# Patient Record
Sex: Male | Born: 1965
Health system: Southern US, Community
[De-identification: ages and names within clinical notes are randomized; demographics above are authoritative.]

## PROBLEM LIST (undated history)

## (undated) ENCOUNTER — Emergency Department (HOSPITAL_COMMUNITY): Admission: EM | Discharge status: Absent without leave | Payer: Medicare Other

## (undated) ENCOUNTER — Emergency Department (HOSPITAL_COMMUNITY): Admission: EM | Payer: Medicare Other | Source: Home / Self Care

## (undated) DIAGNOSIS — N186 End stage renal disease: Secondary | ICD-10-CM

## (undated) DIAGNOSIS — I82409 Acute embolism and thrombosis of unspecified deep veins of unspecified lower extremity: Secondary | ICD-10-CM

## (undated) DIAGNOSIS — K7460 Unspecified cirrhosis of liver: Secondary | ICD-10-CM

## (undated) DIAGNOSIS — E8770 Fluid overload, unspecified: Secondary | ICD-10-CM

## (undated) DIAGNOSIS — R3989 Other symptoms and signs involving the genitourinary system: Secondary | ICD-10-CM

## (undated) DIAGNOSIS — IMO0002 Reserved for concepts with insufficient information to code with codable children: Secondary | ICD-10-CM

## (undated) DIAGNOSIS — I5022 Chronic systolic (congestive) heart failure: Secondary | ICD-10-CM

## (undated) DIAGNOSIS — K861 Other chronic pancreatitis: Secondary | ICD-10-CM

## (undated) DIAGNOSIS — E162 Hypoglycemia, unspecified: Secondary | ICD-10-CM

## (undated) DIAGNOSIS — N2889 Other specified disorders of kidney and ureter: Secondary | ICD-10-CM

## (undated) DIAGNOSIS — Z86718 Personal history of other venous thrombosis and embolism: Secondary | ICD-10-CM

## (undated) DIAGNOSIS — I132 Hypertensive heart and chronic kidney disease with heart failure and with stage 5 chronic kidney disease, or end stage renal disease: Secondary | ICD-10-CM

## (undated) DIAGNOSIS — Z7189 Other specified counseling: Secondary | ICD-10-CM

## (undated) DIAGNOSIS — G4731 Primary central sleep apnea: Secondary | ICD-10-CM

## (undated) DIAGNOSIS — J441 Chronic obstructive pulmonary disease with (acute) exacerbation: Secondary | ICD-10-CM

## (undated) DIAGNOSIS — J939 Pneumothorax, unspecified: Secondary | ICD-10-CM

## (undated) DIAGNOSIS — N289 Disorder of kidney and ureter, unspecified: Secondary | ICD-10-CM

## (undated) DIAGNOSIS — I700 Atherosclerosis of aorta: Secondary | ICD-10-CM

## (undated) DIAGNOSIS — Z992 Dependence on renal dialysis: Secondary | ICD-10-CM

## (undated) DIAGNOSIS — Z9115 Patient's noncompliance with renal dialysis: Secondary | ICD-10-CM

## (undated) DIAGNOSIS — M545 Low back pain, unspecified: Secondary | ICD-10-CM

## (undated) DIAGNOSIS — Z515 Encounter for palliative care: Secondary | ICD-10-CM

## (undated) DIAGNOSIS — F4323 Adjustment disorder with mixed anxiety and depressed mood: Secondary | ICD-10-CM

## (undated) DIAGNOSIS — R1011 Right upper quadrant pain: Secondary | ICD-10-CM

## (undated) DIAGNOSIS — K859 Acute pancreatitis without necrosis or infection, unspecified: Secondary | ICD-10-CM

## (undated) DIAGNOSIS — K5901 Slow transit constipation: Secondary | ICD-10-CM

## (undated) DIAGNOSIS — E8809 Other disorders of plasma-protein metabolism, not elsewhere classified: Secondary | ICD-10-CM

## (undated) DIAGNOSIS — G8929 Other chronic pain: Secondary | ICD-10-CM

## (undated) DIAGNOSIS — E1129 Type 2 diabetes mellitus with other diabetic kidney complication: Secondary | ICD-10-CM

## (undated) DIAGNOSIS — I428 Other cardiomyopathies: Secondary | ICD-10-CM

## (undated) DIAGNOSIS — M25552 Pain in left hip: Secondary | ICD-10-CM

## (undated) DIAGNOSIS — J810 Acute pulmonary edema: Secondary | ICD-10-CM

## (undated) DIAGNOSIS — R1902 Left upper quadrant abdominal swelling, mass and lump: Secondary | ICD-10-CM

## (undated) DIAGNOSIS — E875 Hyperkalemia: Secondary | ICD-10-CM

## (undated) DIAGNOSIS — R0781 Pleurodynia: Secondary | ICD-10-CM

## (undated) DIAGNOSIS — J90 Pleural effusion, not elsewhere classified: Secondary | ICD-10-CM

## (undated) DIAGNOSIS — I82611 Acute embolism and thrombosis of superficial veins of right upper extremity: Secondary | ICD-10-CM

## (undated) DIAGNOSIS — D649 Anemia, unspecified: Secondary | ICD-10-CM

## (undated) DIAGNOSIS — N281 Cyst of kidney, acquired: Secondary | ICD-10-CM

## (undated) DIAGNOSIS — F418 Other specified anxiety disorders: Secondary | ICD-10-CM

## (undated) DIAGNOSIS — R0902 Hypoxemia: Secondary | ICD-10-CM

## (undated) DIAGNOSIS — Z8619 Personal history of other infectious and parasitic diseases: Secondary | ICD-10-CM

## (undated) DIAGNOSIS — R1013 Epigastric pain: Secondary | ICD-10-CM

## (undated) DIAGNOSIS — R1110 Vomiting, unspecified: Secondary | ICD-10-CM

## (undated) DIAGNOSIS — M25512 Pain in left shoulder: Secondary | ICD-10-CM

## (undated) DIAGNOSIS — N19 Unspecified kidney failure: Secondary | ICD-10-CM

## (undated) DIAGNOSIS — R001 Bradycardia, unspecified: Secondary | ICD-10-CM

## (undated) DIAGNOSIS — K56609 Unspecified intestinal obstruction, unspecified as to partial versus complete obstruction: Secondary | ICD-10-CM

## (undated) DIAGNOSIS — I10 Essential (primary) hypertension: Secondary | ICD-10-CM

## (undated) DIAGNOSIS — I498 Other specified cardiac arrhythmias: Secondary | ICD-10-CM

## (undated) DIAGNOSIS — R079 Chest pain, unspecified: Secondary | ICD-10-CM

## (undated) DIAGNOSIS — N185 Chronic kidney disease, stage 5: Secondary | ICD-10-CM

## (undated) DIAGNOSIS — I5020 Unspecified systolic (congestive) heart failure: Secondary | ICD-10-CM

## (undated) DIAGNOSIS — K863 Pseudocyst of pancreas: Secondary | ICD-10-CM

## (undated) DIAGNOSIS — T4145XA Adverse effect of unspecified anesthetic, initial encounter: Secondary | ICD-10-CM

## (undated) DIAGNOSIS — I5042 Chronic combined systolic (congestive) and diastolic (congestive) heart failure: Secondary | ICD-10-CM

## (undated) DIAGNOSIS — T8859XA Other complications of anesthesia, initial encounter: Secondary | ICD-10-CM

## (undated) DIAGNOSIS — I2699 Other pulmonary embolism without acute cor pulmonale: Secondary | ICD-10-CM

## (undated) DIAGNOSIS — Z9689 Presence of other specified functional implants: Secondary | ICD-10-CM

## (undated) DIAGNOSIS — R109 Unspecified abdominal pain: Secondary | ICD-10-CM

## (undated) DIAGNOSIS — E1165 Type 2 diabetes mellitus with hyperglycemia: Secondary | ICD-10-CM

## (undated) DIAGNOSIS — R0600 Dyspnea, unspecified: Secondary | ICD-10-CM

## (undated) HISTORY — PX: CAPD INSERTION: SHX5233

## (undated) HISTORY — PX: CAPD REMOVAL: SHX5234

---

## 2000-09-02 ENCOUNTER — Inpatient Hospital Stay (HOSPITAL_COMMUNITY): Admission: EM | Admit: 2000-09-02 | Discharge: 2000-09-05 | Payer: Self-pay | Admitting: Emergency Medicine

## 2000-09-02 ENCOUNTER — Encounter: Payer: Self-pay | Admitting: Emergency Medicine

## 2000-09-03 ENCOUNTER — Encounter: Payer: Self-pay | Admitting: Nephrology

## 2000-09-04 ENCOUNTER — Encounter: Payer: Self-pay | Admitting: Nephrology

## 2000-09-30 ENCOUNTER — Ambulatory Visit (HOSPITAL_COMMUNITY): Admission: RE | Admit: 2000-09-30 | Discharge: 2000-09-30 | Payer: Self-pay | Admitting: Vascular Surgery

## 2000-09-30 ENCOUNTER — Encounter: Payer: Self-pay | Admitting: Vascular Surgery

## 2000-10-16 ENCOUNTER — Ambulatory Visit (HOSPITAL_COMMUNITY): Admission: RE | Admit: 2000-10-16 | Discharge: 2000-10-16 | Payer: Self-pay | Admitting: General Surgery

## 2000-11-14 ENCOUNTER — Ambulatory Visit (HOSPITAL_COMMUNITY): Admission: RE | Admit: 2000-11-14 | Discharge: 2000-11-14 | Payer: Self-pay | Admitting: *Deleted

## 2002-10-09 ENCOUNTER — Emergency Department (HOSPITAL_COMMUNITY): Admission: EM | Admit: 2002-10-09 | Discharge: 2002-10-09 | Payer: Self-pay | Admitting: Emergency Medicine

## 2002-11-02 ENCOUNTER — Emergency Department (HOSPITAL_COMMUNITY): Admission: EM | Admit: 2002-11-02 | Discharge: 2002-11-03 | Payer: Self-pay | Admitting: Emergency Medicine

## 2003-04-29 ENCOUNTER — Encounter: Admission: RE | Admit: 2003-04-29 | Discharge: 2003-04-29 | Payer: Self-pay | Admitting: Nephrology

## 2003-04-29 ENCOUNTER — Encounter: Payer: Self-pay | Admitting: Nephrology

## 2004-01-29 ENCOUNTER — Emergency Department (HOSPITAL_COMMUNITY): Admission: EM | Admit: 2004-01-29 | Discharge: 2004-01-29 | Payer: Self-pay | Admitting: Emergency Medicine

## 2004-07-22 HISTORY — PX: OTHER SURGICAL HISTORY: SHX169

## 2005-10-31 ENCOUNTER — Emergency Department (HOSPITAL_COMMUNITY): Admission: EM | Admit: 2005-10-31 | Discharge: 2005-10-31 | Payer: Self-pay | Admitting: Emergency Medicine

## 2007-11-06 ENCOUNTER — Emergency Department (HOSPITAL_COMMUNITY): Admission: EM | Admit: 2007-11-06 | Discharge: 2007-11-06 | Payer: Self-pay | Admitting: Emergency Medicine

## 2008-03-21 IMAGING — CT CT PELVIS W/O CM
2 of 4 series · 17 of 46 positions shown, 19 images · IV contrast (ORAL OMNI 350 &)
Comparison: none

HISTORY: Abdominal pain, cramping, nausea, vomiting, history hypertension, renal
insufficiency status post renal transplantation

[Series 2: routine abdomen · axial · 0.73mm/px · z∈[-487,-62]mm · 14 of 95 slices shown, 16 images]
[im 5/95  soft-tissue]
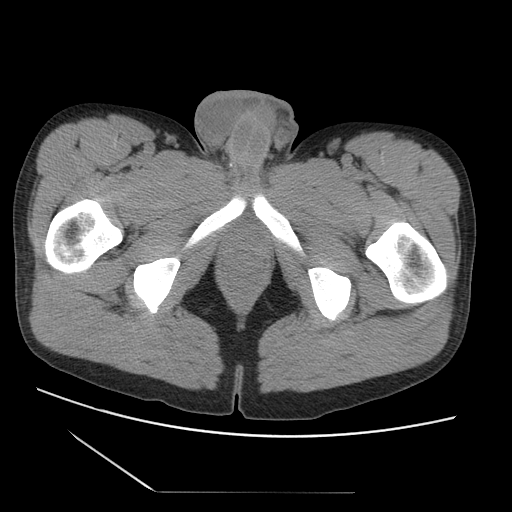
[im 5/95  bone]
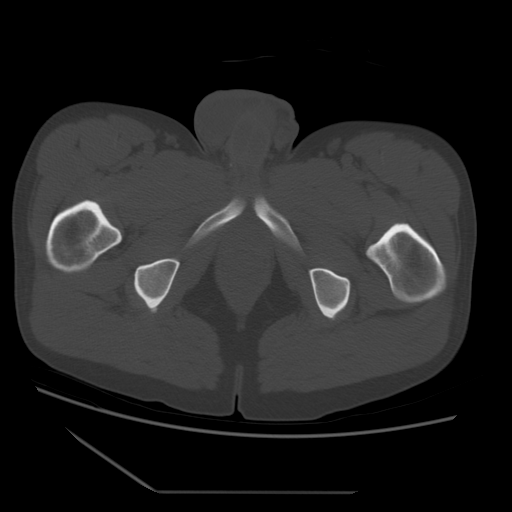
[im 13/95  soft-tissue]
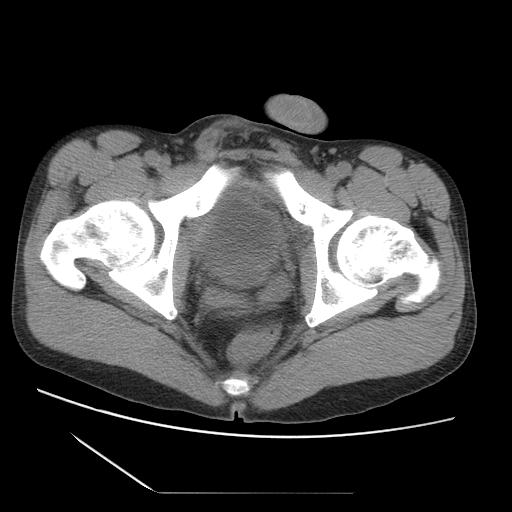
[im 17/95  soft-tissue]
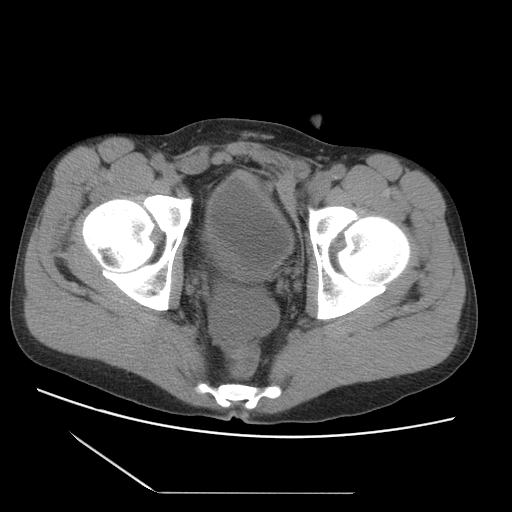
[im 25/95  soft-tissue]
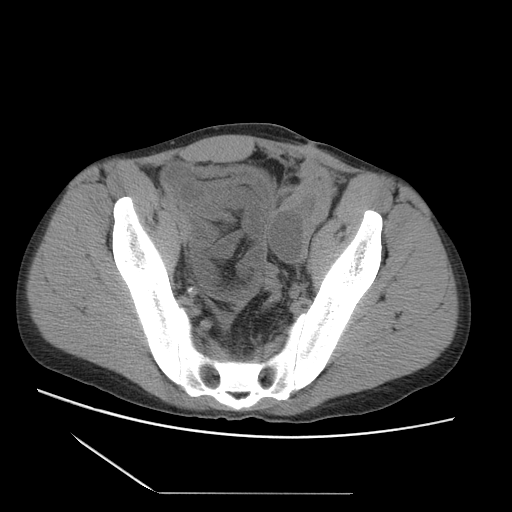
[im 33/95  soft-tissue]
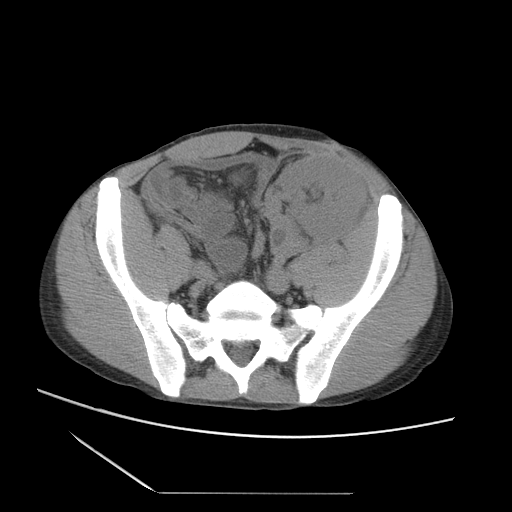
[im 37/95  soft-tissue]
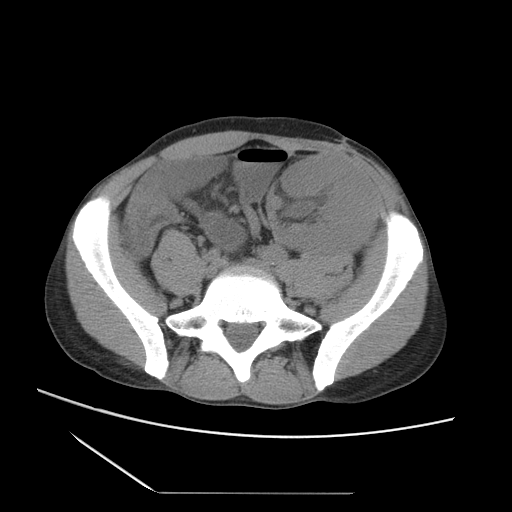
[im 45/95  soft-tissue]
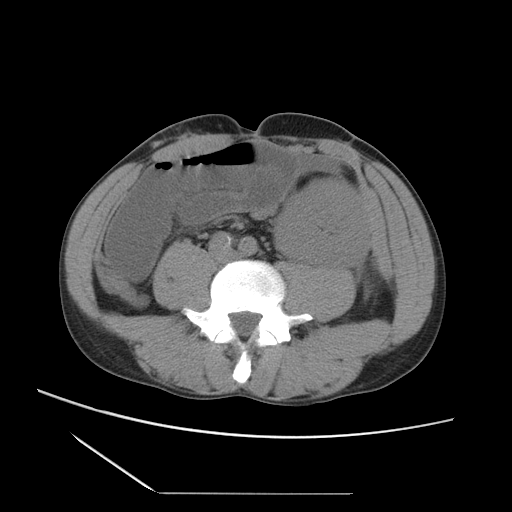
[im 50/95  soft-tissue]
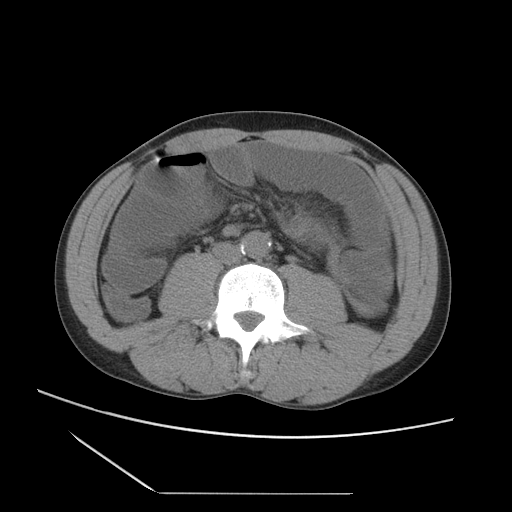
[im 58/95  soft-tissue]
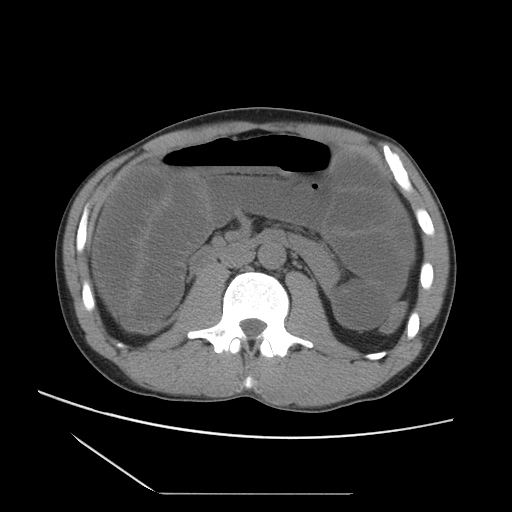
[im 58/95  bone]
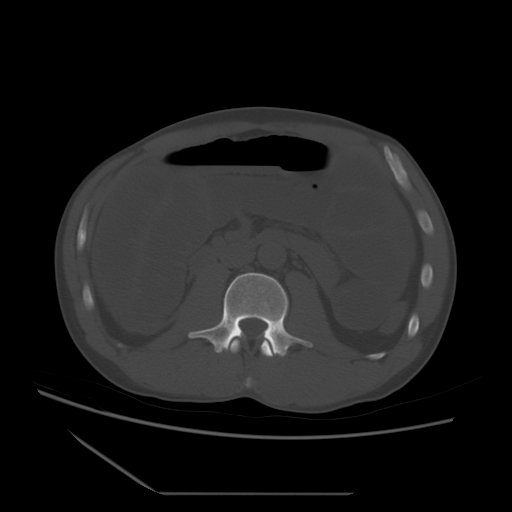
[im 62/95  soft-tissue]
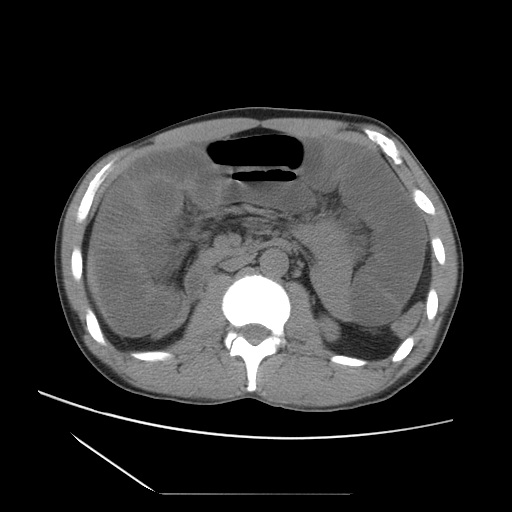
[im 70/95  soft-tissue]
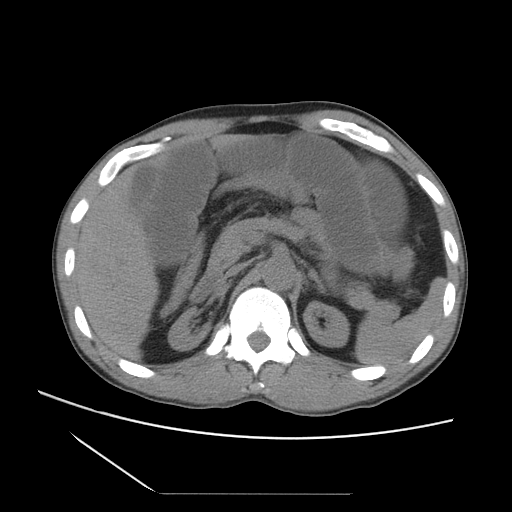
[im 78/95  soft-tissue]
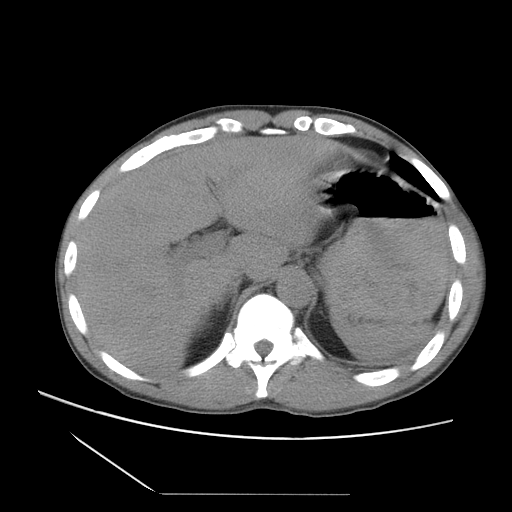
[im 82/95  soft-tissue]
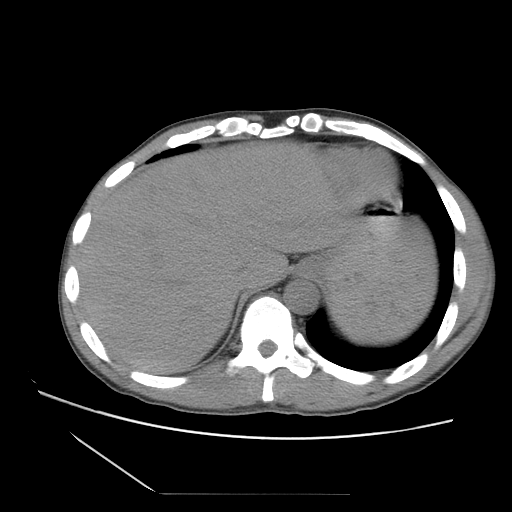
[im 90/95  soft-tissue]
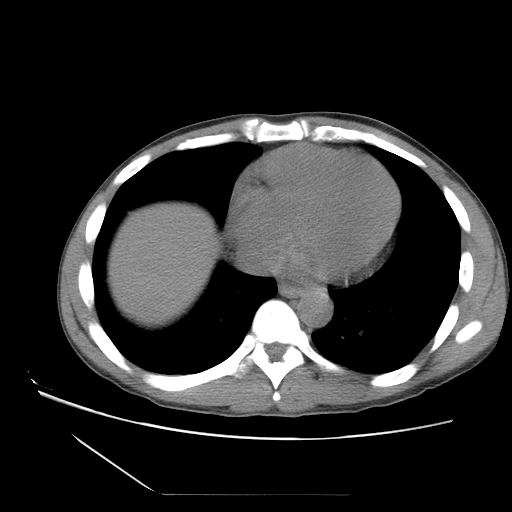

[Series 104: reformatted · coronal · 0.98mm/px · 3 of 118 slices shown]
[im 40/118  soft-tissue]
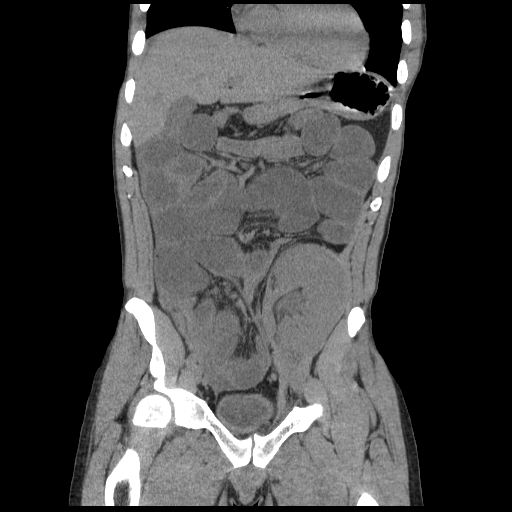
[im 53/118  soft-tissue]
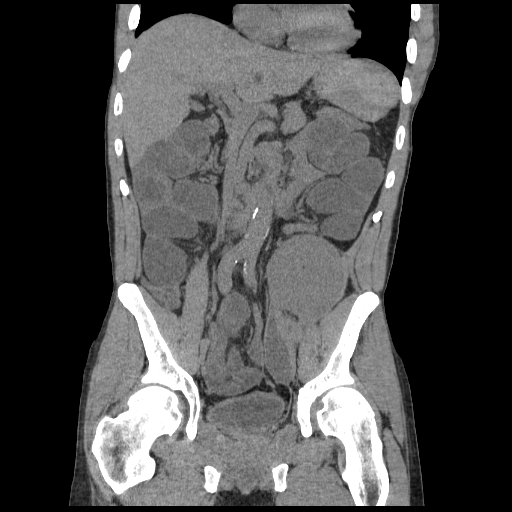
[im 66/118  soft-tissue]
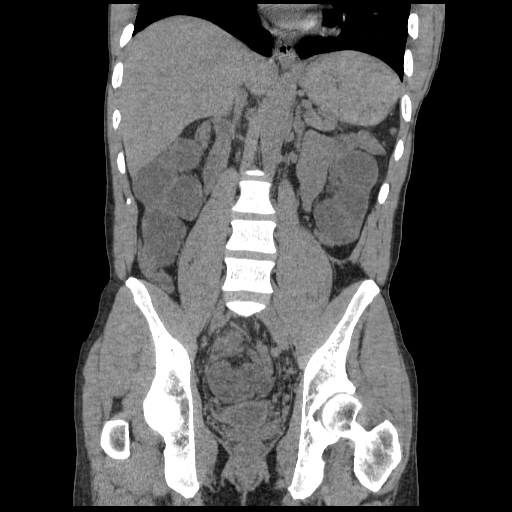

[17 of 46 positions shown; findings below may reference images not displayed]

CT ABDOMEN AND PELVIS WITHOUT CONTRAST:

Multidetector helical CT imaging abdomen pelvis performed.
IV contrast not utilized due to renal insufficiency and renal transplantation.
Patient attempted oral contrast but vomited the majority of this.

CT ABDOMEN:

Lung bases clear.
Atrophic native kidneys bilaterally.
No focal abnormalities of liver, pancreas, spleen, or adrenal glands.
Multiple dilated loops of small bowel in upper abdomen compatible with small
bowel obstruction.
Colon decompressed.
No mass or adenopathy.
Minimal ascites.
IMPRESSION: Small bowel obstruction, see below.

CT PELVIS:

Dilated proximal small bowel loops with normal caliber distal ileum with
transition point in mid pelvis.
No discrete soft tissue mass or hernia identified.
Transplanted kidney in left iliac fossa.
Inferior to left kidney, focal fluid collection identified in, 2.6 x 3.9 cm
image 71, appears separate from kidney, question postoperative seroma or
lymphocele.
Free fluid in pelvis.
No definite adenopathy, bowel wall thickening, or free air.
Minimal atherosclerotic calcification aorta.
Minimal bladder wall thickening.
No significant bone abnormality.
IMPRESSION: Small bowel obstruction at approximately the distal jejunum with transition
point from dilated to normal caliber in mid pelvis.
Transplanted kidney in left iliac fossa with minimal surrounding stranding,
nonspecific.
Focal fluid collection inferior to transplant kidney in mid lateral left pelvis,
[DATE] x 2.6 cm, question postoperative lymphocele or seroma.
Small to moderate ascites.

Findings called to Dr. Harsimran at time of interpretation.

## 2010-01-04 ENCOUNTER — Emergency Department (HOSPITAL_COMMUNITY): Admission: EM | Admit: 2010-01-04 | Discharge: 2010-01-04 | Payer: Self-pay | Admitting: Emergency Medicine

## 2011-10-16 ENCOUNTER — Emergency Department (HOSPITAL_COMMUNITY)
Admission: EM | Admit: 2011-10-16 | Discharge: 2011-10-16 | Disposition: A | Discharge status: Home / Self Care | Payer: Self-pay | Attending: Emergency Medicine | Admitting: Emergency Medicine

## 2011-10-16 ENCOUNTER — Encounter (HOSPITAL_COMMUNITY): Payer: Self-pay | Admitting: Emergency Medicine

## 2011-10-16 DIAGNOSIS — Z940 Kidney transplant status: Secondary | ICD-10-CM | POA: Insufficient documentation

## 2011-10-16 DIAGNOSIS — K029 Dental caries, unspecified: Secondary | ICD-10-CM | POA: Insufficient documentation

## 2011-10-16 DIAGNOSIS — I10 Essential (primary) hypertension: Secondary | ICD-10-CM | POA: Insufficient documentation

## 2011-10-16 DIAGNOSIS — Z79899 Other long term (current) drug therapy: Secondary | ICD-10-CM | POA: Insufficient documentation

## 2011-10-16 DIAGNOSIS — K0889 Other specified disorders of teeth and supporting structures: Secondary | ICD-10-CM

## 2011-10-16 LAB — CBC
Hemoglobin: 11.9 g/dL — ABNORMAL LOW (ref 13.0–17.0)
RBC: 4.29 MIL/uL (ref 4.22–5.81)
WBC: 7.5 10*3/uL (ref 4.0–10.5)

## 2011-10-16 LAB — POCT I-STAT, CHEM 8
BUN: 18 mg/dL (ref 6–23)
Calcium, Ion: 1.25 mmol/L (ref 1.12–1.32)
Chloride: 109 mEq/L (ref 96–112)

## 2011-10-16 MED ORDER — BUPIVACAINE HCL (PF) 0.5 % IJ SOLN
INTRAMUSCULAR | Status: AC
  Administered 2011-10-16: 04:00:00
  Filled 2011-10-16: qty 10

## 2011-10-16 MED ORDER — BUPIVACAINE HCL 0.5 % IJ SOLN: 50.0000 mL | Freq: Once | INTRAMUSCULAR | Status: DC

## 2011-10-16 MED ORDER — PENICILLIN V POTASSIUM 500 MG PO TABS: 500.0000 mg | ORAL_TABLET | Freq: Four times a day (QID) | ORAL | Status: AC

## 2011-10-16 MED ORDER — OXYCODONE-ACETAMINOPHEN 5-325 MG PO TABS: 2.0000 | ORAL_TABLET | ORAL | Status: AC | PRN

## 2011-10-16 MED ORDER — BUPIVACAINE HCL (PF) 0.5 % IJ SOLN
INTRAMUSCULAR | Status: AC
  Filled 2011-10-16: qty 10

## 2011-10-16 NOTE — ED Notes (Signed)
PT. REPORTS RIGHT UPPER MOLAR PAIN , RIGHT EAR ACHE AND HEADACHE FOR 2 DAYS.

## 2011-10-16 NOTE — ED Notes (Signed)
Rx x 2, pt voiced understanding to f/u with DDS

## 2011-10-16 NOTE — Discharge Instructions (Signed)
 Dental Pain A tooth ache may be caused by cavities (tooth decay). Cavities expose the nerve of the tooth to air and hot or cold temperatures. It may come from an infection or abscess (also called a boil or furuncle) around your tooth. It is also often caused by dental caries (tooth decay). This causes the pain you are having. DIAGNOSIS  Your caregiver can diagnose this problem by exam. TREATMENT   If caused by an infection, it may be treated with medications which kill germs (antibiotics) and pain medications as prescribed by your caregiver. Take medications as directed.   Only take over-the-counter or prescription medicines for pain, discomfort, or fever as directed by your caregiver.   Whether the tooth ache today is caused by infection or dental disease, you should see your dentist as soon as possible for further care.  SEEK MEDICAL CARE IF: The exam and treatment you received today has been provided on an emergency basis only. This is not a substitute for complete medical or dental care. If your problem worsens or new problems (symptoms) appear, and you are unable to meet with your dentist, call or return to this location. SEEK IMMEDIATE MEDICAL CARE IF:   You have a fever.   You develop redness and swelling of your face, jaw, or neck.   You are unable to open your mouth.   You have severe pain uncontrolled by pain medicine.  MAKE SURE YOU:   Understand these instructions.   Will watch your condition.   Will get help right away if you are not doing well or get worse.  Document Released: 07/08/2005 Document Revised: 06/27/2011 Document Reviewed: 02/24/2008 [facility] Patient Information 2012 [place], [place].

## 2011-10-16 NOTE — ED Provider Notes (Signed)
History     CSN: [id number]  Arrival date & time 10/16/11  0272   First MD Initiated Contact with Patient 10/16/11 0144      Chief Complaint  Patient presents with  . Dental Pain    (Consider location/radiation/quality/duration/timing/severity/associated sxs/prior treatment) Patient is a 46 y.o. male presenting with tooth pain. The history is provided by the patient.  Dental PainThe primary symptoms include mouth pain. Primary symptoms do not include headaches, fever or shortness of breath. The symptoms began 2 days ago. The symptoms are unchanged. The symptoms are new. The symptoms occur constantly.  Additional symptoms include: ear pain. Additional symptoms do not include: gum swelling, gum tenderness, trismus, facial swelling, trouble swallowing, pain with swallowing and excessive salivation. Medical issues do not include: smoking.   right upper first molar pain for last 2 days hurts to eat anything or put pressure on that tooth. No alleviating factors. Patient does not have a dentist. He has not been taking medications for this. Has had some chills. Pain is sharp in quality and radiates to right ear.  Past Medical History  Diagnosis Date  . Hypertension   . Renal failure     Past Surgical History  Procedure Date  . Kidney receipient     No family history on file.  History  Substance Use Topics  . Smoking status: Never Smoker   . Smokeless tobacco: Not on file  . Alcohol Use: No      Review of Systems  Constitutional: Negative for fever and chills.  HENT: Positive for ear pain and dental problem. Negative for facial swelling, trouble swallowing, neck pain and neck stiffness.   Eyes: Negative for pain.  Respiratory: Negative for shortness of breath.   Cardiovascular: Negative for chest pain.  Gastrointestinal: Negative for abdominal pain.  Genitourinary: Negative for dysuria.  Musculoskeletal: Negative for back pain.  Skin: Negative for rash.  Neurological:  Negative for headaches.  All other systems reviewed and are negative.    Allergies  Darvocet  Home Medications   Current Outpatient Rx  Name Route Sig Dispense Refill  . ATENOLOL 50 MG PO TABS Oral Take 50 mg by mouth daily.    [name] LABETALOL HCL 200 MG PO TABS Oral Take 200 mg by mouth 2 (two) times daily.    [name] MYCOPHENOLATE MOFETIL 500 MG PO TABS Oral Take 500 mg by mouth 2 (two) times daily.    [name] TACROLIMUS 1 MG PO CAPS Oral Take 3 mg by mouth 2 (two) times daily.    [name] TACROLIMUS 5 MG PO CAPS Oral Take 5 mg by mouth 2 (two) times daily.      BP 146/87  Pulse 58  Temp(Src) 98.6 F (37 C) (Oral)  Resp 14  SpO2 100%  Physical Exam  Constitutional: He is oriented to person, place, and time. He appears well-developed and well-nourished.  HENT:  Head: Normocephalic and atraumatic.       No trismus. Tender right upper first molar with caries present. No associated gingival swelling. No facial swelling or erythema.  Eyes: Conjunctivae and EOM are normal. Pupils are equal, round, and reactive to light.  Neck: Trachea normal. Neck supple. No thyromegaly present.  Cardiovascular: Normal rate, regular rhythm, S1 normal, S2 normal and normal pulses.     No systolic murmur is present   No diastolic murmur is present  Pulses:      Radial pulses are 2+ on the right side, and 2+ on the left side.  Pulmonary/Chest:  Effort normal and breath sounds normal. He has no wheezes. He has no rhonchi. He has no rales. He exhibits no tenderness.  Abdominal: Soft. Normal appearance and bowel sounds are normal. There is no tenderness. There is no CVA tenderness and negative Murphy's sign.  Musculoskeletal:       BLE:s Calves nontender, no cords or erythema, negative Homans sign  Neurological: He is alert and oriented to person, place, and time. He has normal strength. No cranial nerve deficit or sensory deficit. GCS eye subscore is 4. GCS verbal subscore is 5. GCS motor subscore is 6.  Skin: Skin is  warm and dry. No rash noted. He is not diaphoretic.  Psychiatric: His speech is normal.       Cooperative and appropriate    ED Course  Dental Date/Time: 10/16/2011 3:51 AM Performed by: Teressa Lower Authorized by: Teressa Lower Consent: Verbal consent obtained. Risks and benefits: risks, benefits and alternatives were discussed Consent given by: patient Patient understanding: patient states understanding of the procedure being performed Patient consent: the patient's understanding of the procedure matches consent given Procedure consent: procedure consent matches procedure scheduled Required items: required blood products, implants, devices, and special equipment available Patient identity confirmed: verbally with patient Time out: Immediately prior to procedure a "time out" was called to verify the correct patient, procedure, equipment, support staff and site/side marked as required. Preparation: Patient was prepped and draped in the usual sterile fashion. Local anesthesia used: yes Local anesthetic: bupivacaine 0.5% without epinephrine Anesthetic total: 2 ml Patient tolerance: Patient tolerated the procedure well with no immediate complications. Comments: Local right upper first molar injection. Post procedure  - Pain improving and patient tolerated procedure well. No blood loss.   (including critical care time)  Labs Reviewed  CBC - Abnormal; Notable for the following:    Hemoglobin 11.9 (*)    HCT 36.7 (*)    All other components within normal limits  POCT I-STAT, CHEM 8 - Abnormal; Notable for the following:    Creatinine, Ser 1.70 (*)    All other components within normal limits      MDM   Dental pain and presentation consistent with dental abscess. Injection as above improve symptoms. for history of kidney transplant will avoid Motrin. Plan discharge home with followup dentist. Prescription for Percocet and antibiotics provided.        Teressa Lower, MD 10/16/11  [phone]

## 2012-08-31 ENCOUNTER — Emergency Department (HOSPITAL_COMMUNITY): Payer: Medicare Other

## 2012-08-31 ENCOUNTER — Inpatient Hospital Stay (HOSPITAL_COMMUNITY)
Admission: EM | Admit: 2012-08-31 | Discharge: 2012-09-03 | DRG: 698 | Payer: Medicare Other | Attending: Internal Medicine | Admitting: Internal Medicine

## 2012-08-31 ENCOUNTER — Encounter (HOSPITAL_COMMUNITY): Payer: Self-pay

## 2012-08-31 DIAGNOSIS — N179 Acute kidney failure, unspecified: Secondary | ICD-10-CM | POA: Diagnosis present

## 2012-08-31 DIAGNOSIS — N19 Unspecified kidney failure: Secondary | ICD-10-CM

## 2012-08-31 DIAGNOSIS — N189 Chronic kidney disease, unspecified: Secondary | ICD-10-CM

## 2012-08-31 DIAGNOSIS — I120 Hypertensive chronic kidney disease with stage 5 chronic kidney disease or end stage renal disease: Secondary | ICD-10-CM | POA: Diagnosis present

## 2012-08-31 DIAGNOSIS — E872 Acidosis, unspecified: Secondary | ICD-10-CM | POA: Diagnosis present

## 2012-08-31 DIAGNOSIS — E876 Hypokalemia: Secondary | ICD-10-CM | POA: Diagnosis present

## 2012-08-31 DIAGNOSIS — D631 Anemia in chronic kidney disease: Secondary | ICD-10-CM | POA: Diagnosis present

## 2012-08-31 DIAGNOSIS — F329 Major depressive disorder, single episode, unspecified: Secondary | ICD-10-CM

## 2012-08-31 DIAGNOSIS — E871 Hypo-osmolality and hyponatremia: Secondary | ICD-10-CM | POA: Diagnosis present

## 2012-08-31 DIAGNOSIS — N039 Chronic nephritic syndrome with unspecified morphologic changes: Secondary | ICD-10-CM | POA: Diagnosis present

## 2012-08-31 DIAGNOSIS — T8610 Unspecified complication of kidney transplant: Principal | ICD-10-CM | POA: Diagnosis present

## 2012-08-31 DIAGNOSIS — Z79899 Other long term (current) drug therapy: Secondary | ICD-10-CM

## 2012-08-31 DIAGNOSIS — Y830 Surgical operation with transplant of whole organ as the cause of abnormal reaction of the patient, or of later complication, without mention of misadventure at the time of the procedure: Secondary | ICD-10-CM | POA: Diagnosis present

## 2012-08-31 DIAGNOSIS — E875 Hyperkalemia: Secondary | ICD-10-CM | POA: Diagnosis present

## 2012-08-31 DIAGNOSIS — R55 Syncope and collapse: Secondary | ICD-10-CM | POA: Diagnosis present

## 2012-08-31 DIAGNOSIS — N186 End stage renal disease: Secondary | ICD-10-CM | POA: Diagnosis present

## 2012-08-31 DIAGNOSIS — F3289 Other specified depressive episodes: Secondary | ICD-10-CM | POA: Diagnosis present

## 2012-08-31 DIAGNOSIS — I10 Essential (primary) hypertension: Secondary | ICD-10-CM

## 2012-08-31 HISTORY — DX: Adverse effect of unspecified anesthetic, initial encounter: T41.45XA

## 2012-08-31 HISTORY — DX: Other complications of anesthesia, initial encounter: T88.59XA

## 2012-08-31 LAB — CBC WITH DIFFERENTIAL/PLATELET
Basophils Relative: 0 % (ref 0–1)
Eosinophils Absolute: 0.2 10*3/uL (ref 0.0–0.7)
Eosinophils Relative: 3 % (ref 0–5)
Hemoglobin: 10.7 g/dL — ABNORMAL LOW (ref 13.0–17.0)
Lymphocytes Relative: 14 % (ref 12–46)
MCH: 27 pg (ref 26.0–34.0)
MCHC: 33.3 g/dL (ref 30.0–36.0)
Monocytes Relative: 10 % (ref 3–12)
Neutro Abs: 5.6 10*3/uL (ref 1.7–7.7)
Platelets: 419 10*3/uL — ABNORMAL HIGH (ref 150–400)
RDW: 13.2 % (ref 11.5–15.5)

## 2012-08-31 LAB — URINALYSIS, ROUTINE W REFLEX MICROSCOPIC
Glucose, UA: NEGATIVE mg/dL
Protein, ur: 100 mg/dL — AB
Specific Gravity, Urine: 1.011 (ref 1.005–1.030)
pH: 6.5 (ref 5.0–8.0)

## 2012-08-31 LAB — GLUCOSE, CAPILLARY: Glucose-Capillary: 135 mg/dL — ABNORMAL HIGH (ref 70–99)

## 2012-08-31 LAB — URINE MICROSCOPIC-ADD ON

## 2012-08-31 LAB — BASIC METABOLIC PANEL
Chloride: 92 mEq/L — ABNORMAL LOW (ref 96–112)
Creatinine, Ser: 20.77 mg/dL — ABNORMAL HIGH (ref 0.50–1.35)
Sodium: 126 mEq/L — ABNORMAL LOW (ref 135–145)

## 2012-08-31 LAB — POCT I-STAT TROPONIN I: Troponin i, poc: 0 ng/mL (ref 0.00–0.08)

## 2012-08-31 MED ORDER — TACROLIMUS 1 MG PO CAPS
8.0000 mg | ORAL_CAPSULE | Freq: Two times a day (BID) | ORAL | Status: DC
  Administered 2012-09-01 – 2012-09-03 (×6): 8 mg via ORAL
  Filled 2012-08-31 (×7): qty 8

## 2012-08-31 MED ORDER — ACETAMINOPHEN 650 MG RE SUPP: 650.0000 mg | Freq: Four times a day (QID) | RECTAL | Status: DC | PRN

## 2012-08-31 MED ORDER — INSULIN ASPART 100 UNIT/ML ~~LOC~~ SOLN
10.0000 [IU] | Freq: Once | SUBCUTANEOUS | Status: AC
  Administered 2012-08-31: 10 [IU] via SUBCUTANEOUS
  Filled 2012-08-31: qty 10

## 2012-08-31 MED ORDER — DEXTROSE 50 % IV SOLN
1.0000 | Freq: Once | INTRAVENOUS | Status: AC
  Administered 2012-08-31: 50 mL via INTRAVENOUS
  Filled 2012-08-31: qty 50

## 2012-08-31 MED ORDER — LABETALOL HCL 200 MG PO TABS
200.0000 mg | ORAL_TABLET | Freq: Two times a day (BID) | ORAL | Status: DC
  Administered 2012-09-01 – 2012-09-03 (×6): 200 mg via ORAL
  Filled 2012-08-31 (×8): qty 1

## 2012-08-31 MED ORDER — ACETAMINOPHEN 325 MG PO TABS
650.0000 mg | ORAL_TABLET | Freq: Four times a day (QID) | ORAL | Status: DC | PRN
  Administered 2012-09-02 – 2012-09-03 (×2): 650 mg via ORAL
  Filled 2012-08-31 (×2): qty 2

## 2012-08-31 MED ORDER — SODIUM CHLORIDE 0.9 % IV SOLN
1.0000 g | Freq: Once | INTRAVENOUS | Status: AC
  Administered 2012-08-31: 1 g via INTRAVENOUS
  Filled 2012-08-31: qty 10

## 2012-08-31 MED ORDER — ENOXAPARIN SODIUM 30 MG/0.3ML ~~LOC~~ SOLN
30.0000 mg | SUBCUTANEOUS | Status: DC
  Administered 2012-09-01: 30 mg via SUBCUTANEOUS
  Filled 2012-08-31 (×4): qty 0.3

## 2012-08-31 MED ORDER — INSULIN REGULAR HUMAN 100 UNIT/ML IJ SOLN
10.0000 [IU] | Freq: Once | INTRAMUSCULAR | Status: DC
  Filled 2012-08-31: qty 0.1

## 2012-08-31 MED ORDER — SODIUM POLYSTYRENE SULFONATE 15 GM/60ML PO SUSP
30.0000 g | Freq: Once | ORAL | Status: AC
  Administered 2012-08-31: 30 g via ORAL
  Filled 2012-08-31: qty 120

## 2012-08-31 MED ORDER — ONDANSETRON HCL 4 MG PO TABS: 4.0000 mg | ORAL_TABLET | Freq: Four times a day (QID) | ORAL | Status: DC | PRN

## 2012-08-31 MED ORDER — ONDANSETRON HCL 4 MG/2ML IJ SOLN: 4.0000 mg | Freq: Four times a day (QID) | INTRAMUSCULAR | Status: DC | PRN

## 2012-08-31 MED ORDER — STERILE WATER FOR INJECTION IV SOLN: INTRAVENOUS | Status: DC

## 2012-08-31 MED ORDER — MYCOPHENOLATE MOFETIL 250 MG PO CAPS
500.0000 mg | ORAL_CAPSULE | Freq: Two times a day (BID) | ORAL | Status: DC
  Administered 2012-09-01 – 2012-09-03 (×6): 500 mg via ORAL
  Filled 2012-08-31 (×7): qty 2

## 2012-08-31 MED ORDER — STERILE WATER FOR INJECTION IV SOLN
INTRAVENOUS | Status: AC
  Administered 2012-08-31: 19:00:00 via INTRAVENOUS
  Filled 2012-08-31 (×2): qty 850

## 2012-08-31 MED ORDER — TACROLIMUS 1 MG PO CAPS
5.0000 mg | ORAL_CAPSULE | Freq: Two times a day (BID) | ORAL | Status: DC
  Filled 2012-08-31: qty 5

## 2012-08-31 MED ORDER — TACROLIMUS 1 MG PO CAPS
3.0000 mg | ORAL_CAPSULE | Freq: Two times a day (BID) | ORAL | Status: DC
  Filled 2012-08-31: qty 3

## 2012-08-31 MED ORDER — SODIUM BICARBONATE 8.4 % IV SOLN
50.0000 meq | Freq: Once | INTRAVENOUS | Status: AC
  Administered 2012-08-31: 50 meq via INTRAVENOUS
  Filled 2012-08-31: qty 50

## 2012-08-31 NOTE — Progress Notes (Signed)
CM also provided pt with needymeds.org to complete patient assistance programs for cellcept and prograf Cm provided the application for cellcept but prograf requires pt to go onto drug companies site Reviewed these with the pt and placed these written items in a patient belonging bag for the pt He voiced appreciation of services and resources offered

## 2012-08-31 NOTE — H&P (Signed)
[facility] History and Physical  [name] EXB:[id number] DOB: [birth year] DOA: 08/31/2012  Referring physician: PCP: Adin Hector, Student  Specialists:  Chief Complaint: Syncope  HPI: Frank [name] is a 47 y.o. male with history of renal transplant 8 years ago at [facility], hypertension who presents with above complaints. He reports that yesterday he had a syncopal episode, admits feeling dizzy prior to the episode. He states he told his sister about it today and she asked him to come to the ED. He denies nausea,vomiting, diarrhea, fevers, dysuria cough ,shortness of breath, leg and no chest pain. He was seen in the ED and a CT scan of his head was negative, and chest x-ray showed no active disease. labs revealed a sodium of 126 with a potassium of 6.5, BUN of 140 creatinine of 20.77 and CO2 of 13. EDP are consulted [facility]-spoke with Dr. Florene Glen who requested admission for [facility] and transfer [facility]. In the ED he was given a bicarb,insulin, amp of D50 and Kayexalate. He is admitted for further evaluation and management. The patient states that he went to jail in last November for 45 days and has not taken any of his transplant medications since then. He states his been urinating without difficulty, and has good po intake.  Review of Systems: The patient denies anorexia, fever, weight loss,, vision loss, decreased hearing, hoarseness, chest pain, , dyspnea on exertion, peripheral edema, balance deficits, hemoptysis, abdominal pain, melena, hematochezia, severe indigestion/heartburn, hematuria, incontinence, genital sores, muscle weakness, transient blindness, difficulty walking, depression, unusual weight change, abnormal bleeding..    Past Medical History  Diagnosis Date  . Hypertension   . Renal failure   . Depression   . Complication of anesthesia     itching, sore throat   Past Surgical History  Procedure Laterality Date  . Kidney receipient     . Nephrectomy transplanted organ     Social History:  reports that he has never smoked. He has never used smokeless tobacco. He reports that he does not drink alcohol or use illicit drugs. where does patient live--home  Can patient participate in ADLs-yes  Allergies  Allergen Reactions  . Darvocet (Propoxyphene-Acetaminophen) Hives    Family history-positive for diabetes mellitus  Prior to Admission medications   Medication Sig Start Date End Date Taking? Authorizing Provider  atenolol (TENORMIN) 50 MG tablet Take 50 mg by mouth daily.    Historical Provider, MD  labetalol (NORMODYNE) 200 MG tablet Take 200 mg by mouth 2 (two) times daily.    Historical Provider, MD  mycophenolate (CELLCEPT) 500 MG tablet Take 500 mg by mouth 2 (two) times daily.    Historical Provider, MD  tacrolimus (PROGRAF) 1 MG capsule Take 3 mg by mouth 2 (two) times daily.    Historical Provider, MD  tacrolimus (PROGRAF) 5 MG capsule Take 5 mg by mouth 2 (two) times daily.    Historical Provider, MD   Physical Exam: Filed Vitals:   08/31/12 1320 08/31/12 1549  BP: 147/94 157/97  Pulse: 62 69  Temp: 97.4 F (36.3 C)   TempSrc: Oral   Resp: 18 16  SpO2: 99%     Constitutional: Vital signs reviewed.  Patient is a well-developed and well-nourished in no acute distress and cooperative with exam. Alert and oriented x3.  Head: Normocephalic and atraumatic Mouth: no erythema or exudates, dry MM Eyes: PERRL, EOMI, conjunctivae normal, No scleral icterus.  Neck: Supple, Trachea midline normal ROM, No JVD, mass, thyromegaly,  or carotid bruit present.  Cardiovascular: RRR, S1 normal, S2 normal, no MRG, pulses symmetric and intact bilaterally Pulmonary/Chest: CTAB, no wheezes, rales, or rhonchi Abdominal: Soft. Non-tender, non-distended, bowel sounds are normal, no masses, organomegaly, or guarding present.  GU: no CVA tenderness  extremities: No cyanosis and no edema  Neurological: A&O x3, Strength is normal  and symmetric bilaterally, cranial nerve II-XII are grossly intact, no focal motor deficit, sensory intact to light touch bilaterally.  Skin: Warm, dry and intact. No rash.  Psychiatric: Normal mood and affect. speech and behavior is normal.  Labs on Admission:  Basic Metabolic Panel:  Recent Labs Lab 08/31/12 1335  NA 126*  K 6.5*  CL 92*  CO2 13*  GLUCOSE 93  BUN 140*  CREATININE 20.77*  CALCIUM 9.5   Liver Function Tests: No results found for this basename: AST, ALT, ALKPHOS, BILITOT, PROT, ALBUMIN,  in the last 168 hours No results found for this basename: LIPASE, AMYLASE,  in the last 168 hours No results found for this basename: AMMONIA,  in the last 168 hours CBC:  Recent Labs Lab 08/31/12 1335  WBC 7.7  NEUTROABS 5.6  HGB 10.7*  HCT 32.1*  MCV 81.1  PLT 419*   Cardiac Enzymes: No results found for this basename: CKTOTAL, CKMB, CKMBINDEX, TROPONINI,  in the last 168 hours  BNP (last 3 results) No results found for this basename: PROBNP,  in the last 8760 hours CBG: No results found for this basename: GLUCAP,  in the last 168 hours  Radiological Exams on Admission: Dg Chest 2 View  08/31/2012  *RADIOLOGY REPORT*  Clinical Data: Loss of consciousness.  History of hypertension.  CHEST - 2 VIEW  Comparison: None.  Findings: Artifact overlies chest.  Heart size is normal. Mediastinal shadows are normal.  The lungs are clear.  Vascularity is normal.  No significant bony finding.  IMPRESSION: No active disease.   Original Report Authenticated By: [name], M.D.    Ct Head Wo Contrast  08/31/2012  *RADIOLOGY REPORT*  Clinical Data: Syncope.  Fell with head trauma.  CT HEAD WITHOUT CONTRAST  Technique:  Contiguous axial images were obtained from the base of the skull through the vertex without contrast.  Comparison: None.  Findings: The brain has a normal appearance without evidence of malformation, atrophy, old or acute infarction, mass lesion, hemorrhage,  hydrocephalus or extra-axial collection.  The calvarium is unremarkable.  Sinuses, middle ears and mastoids are clear.  IMPRESSION: Normal head CT   Original Report Authenticated By: [name], M.D.     EKG: I  Assessment/Plan Active Problems:   Acute-on-chronic renal failure-in patient status post renal transplant 8 years ago. -He has been off his tranplant meds for at least 3 months -volume depletion also possible contributing factor -hydrate, transferred to [facility] for possible dialysis>> renal consulted per EDP-Dr. Florene Glen to see  -Outpatient medications Hyperkalemia -As above given bicarbonate, insulin/D50, and Kayexalate -Follow recheck and further treat accordingly -Renal consulted as above and Dr. Florene Glen to see   Metabolic acidosis -Secondary to #1, will place on bicarbonate drip and follow. Syncope -Obtain orthostatic vital signs, echo and follow Hypertension -Resume labetalol, monitor and further treat accordingly. Hyponatremia -Likely secondary to renal failure, treat as above, follow recheck and further manage accordingly  Code Status: full Family Communication:  Disposition Plan: Admit to [facility], tele.  Time spent: >16mns  VSheila Oats[facility]ad Hospitalists Pager [phone] If 7PM-7AM, please contact night-coverage [email or web address] Password [email or web address]2/04/2013, 5:52 PM

## 2012-08-31 NOTE — ED Notes (Signed)
Pt. Made aware for the need of urine. 

## 2012-08-31 NOTE — Consult Note (Signed)
Reason for Consult: Acute renal failure on chronic kidney disease stage III T. Referring Physician:  [name] M.D.-Emergency room physician  HPI:  47 year old African American man with history of end-stage renal disease secondary to underlying hypertension status post deceased donor kidney transplant with history of chronic allograft nephropathy and chronic kidney disease stage IIIT at baseline. Presented to the emergency room today after a syncopal event and loss of consciousness for about 2 hours. He states that upon awakening, he was concerned about his renal function and presented to the emergency room. Preceding this, for a period of about 6-7 weeks, he has not been on any antirejection medications for his kidney transplant (Prograf/CellCept) as he was incarcerated over this duration. He does state that upon his release, he desperately started to take some leftover Prograf and CellCept that he had from previous prescriptions.  He reports of some preceding nausea without any associated vomiting or dysgeusia. He states that he has had such an event before when he has missed his antirejection medications for about 2-3 weeks without suffering any physical problems. He emphatically states that he has been palpating his allograft and is not experiencing any tenderness. He denies any dysuria but reports some urgency particularly upon sitting up from a supine position. Denies hematuria.  Previous notes available from outpatient nephrology (Dr. Mercy Moore, [facility]) are significant for several lapses and problems with compliance.   Past Medical History  Diagnosis Date  . Hypertension   . Renal failure   . Depression   . Complication of anesthesia     itching, sore throat    Past Surgical History  Procedure Laterality Date  . Kidney receipient    . Nephrectomy transplanted organ      History reviewed. No pertinent family history.  Social History:  reports that he has never  smoked. He has never used smokeless tobacco. He reports that he does not drink alcohol or use illicit drugs.  Allergies:  Allergies  Allergen Reactions  . Darvocet (Propoxyphene-Acetaminophen) Hives    Medications: Prior to Admission:  (Not in a hospital admission)  Results for orders placed during the hospital encounter of 08/31/12 (from the past 48 hour(s))  CBC WITH DIFFERENTIAL     Status: Abnormal   Collection Time    08/31/12  1:35 PM      Result Value Range   WBC 7.7  4.0 - 10.5 K/uL   RBC 3.96 (*) 4.22 - 5.81 MIL/uL   Hemoglobin 10.7 (*) 13.0 - 17.0 g/dL   HCT 32.1 (*) 39.0 - 52.0 %   MCV 81.1  78.0 - 100.0 fL   MCH 27.0  26.0 - 34.0 pg   MCHC 33.3  30.0 - 36.0 g/dL   RDW 13.2  11.5 - 15.5 %   Platelets 419 (*) 150 - 400 K/uL   Neutrophils Relative 72  43 - 77 %   Neutro Abs 5.6  1.7 - 7.7 K/uL   Lymphocytes Relative 14  12 - 46 %   Lymphs Abs 1.1  0.7 - 4.0 K/uL   Monocytes Relative 10  3 - 12 %   Monocytes Absolute 0.8  0.1 - 1.0 K/uL   Eosinophils Relative 3  0 - 5 %   Eosinophils Absolute 0.2  0.0 - 0.7 K/uL   Basophils Relative 0  0 - 1 %   Basophils Absolute 0.0  0.0 - 0.1 K/uL   WBC Morphology MILD LEFT SHIFT (1-5% METAS, OCC MYELO, OCC BANDS)  Smear Review PLATELET COUNT CONFIRMED BY SMEAR    BASIC METABOLIC PANEL     Status: Abnormal   Collection Time    08/31/12  1:35 PM      Result Value Range   Sodium 126 (*) 135 - 145 mEq/L   Potassium 6.5 (*) 3.5 - 5.1 mEq/L   Comment: RESULT REPEATED AND VERIFIED     CRITICAL RESULT CALLED TO, READ BACK BY AND VERIFIED WITH:     KHALID,A. AT 1454 ON [id number] BY LOVE,T.   Chloride 92 (*) 96 - 112 mEq/L   CO2 13 (*) 19 - 32 mEq/L   Glucose, Bld 93  70 - 99 mg/dL   BUN 140 (*) 6 - 23 mg/dL   Creatinine, Ser 20.77 (*) 0.50 - 1.35 mg/dL   Calcium 9.5  8.4 - 10.5 mg/dL   GFR calc non Af Amer 2 (*) >90 mL/min   GFR calc Af Amer 3 (*) >90 mL/min   Comment:            The eGFR has been calculated     using the  CKD EPI equation.     This calculation has not been     validated in all clinical     situations.     eGFR's persistently     <90 mL/min signify     possible Chronic Kidney Disease.  POCT I-STAT TROPONIN I     Status: None   Collection Time    08/31/12  1:46 PM      Result Value Range   Troponin i, poc 0.00  0.00 - 0.08 ng/mL   Comment 3            Comment: Due to the release kinetics of cTnI,     a negative result within the first hours     of the onset of symptoms does not rule out     myocardial infarction with certainty.     If myocardial infarction is still suspected,     repeat the test at appropriate intervals.    Dg Chest 2 View  08/31/2012  *RADIOLOGY REPORT*  Clinical Data: Loss of consciousness.  History of hypertension.  CHEST - 2 VIEW  Comparison: None.  Findings: Artifact overlies chest.  Heart size is normal. Mediastinal shadows are normal.  The lungs are clear.  Vascularity is normal.  No significant bony finding.  IMPRESSION: No active disease.   Original Report Authenticated By: [name], M.D.    Ct Head Wo Contrast  08/31/2012  *RADIOLOGY REPORT*  Clinical Data: Syncope.  Fell with head trauma.  CT HEAD WITHOUT CONTRAST  Technique:  Contiguous axial images were obtained from the base of the skull through the vertex without contrast.  Comparison: None.  Findings: The brain has a normal appearance without evidence of malformation, atrophy, old or acute infarction, mass lesion, hemorrhage, hydrocephalus or extra-axial collection.  The calvarium is unremarkable.  Sinuses, middle ears and mastoids are clear.  IMPRESSION: Normal head CT   Original Report Authenticated By: [name], M.D.     Review of Systems  Constitutional: Positive for chills and malaise/fatigue. Negative for fever, weight loss and diaphoresis.  HENT: Negative.   Eyes: Negative.   Respiratory: Negative.   Cardiovascular: Negative.   Gastrointestinal: Positive for nausea. Negative for heartburn,  vomiting, abdominal pain, diarrhea, constipation, blood in stool and melena.  Genitourinary: Positive for urgency. Negative for dysuria, frequency, hematuria and flank pain.       States  he has urinary urgency when sits up from supine position  Musculoskeletal: Negative.   Skin: Negative.   Neurological: Positive for dizziness, loss of consciousness and weakness. Negative for tingling, tremors, sensory change, speech change, focal weakness and seizures.  Endo/Heme/Allergies: Negative.   Psychiatric/Behavioral: Negative for depression, suicidal ideas, hallucinations and substance abuse. The patient is nervous/anxious.   All other systems reviewed and are negative.   Blood pressure 157/97, pulse 69, temperature 97.4 F (36.3 C), temperature source Oral, resp. rate 16, SpO2 99.00%. Physical Exam  Nursing note and vitals reviewed. Constitutional: He is oriented to person, place, and time. He appears well-developed and well-nourished. No distress.  HENT:  Head: Normocephalic and atraumatic.  Nose: Nose normal.  Mouth/Throat: No oropharyngeal exudate.  Eyes: Conjunctivae and EOM are normal. Pupils are equal, round, and reactive to light. No scleral icterus.  Neck: Normal range of motion. Neck supple. No JVD present. No tracheal deviation present. No thyromegaly present.  Cardiovascular: Normal rate and regular rhythm.  Exam reveals no gallop and no friction rub.   No murmur heard. Respiratory: Effort normal and breath sounds normal. No stridor. No respiratory distress. He has no wheezes. He has no rales. He exhibits no tenderness.  GI: Soft. Bowel sounds are normal. He exhibits no distension and no mass. There is no tenderness. There is no rebound and no guarding.  No allograft site tenderness  Musculoskeletal: Normal range of motion. He exhibits no edema and no tenderness.  Lymphadenopathy:    He has no cervical adenopathy.  Neurological: He is alert and oriented to person, place, and time.  He exhibits normal muscle tone.  Skin: Skin is warm and dry. No rash noted. He is not diaphoretic. No erythema. No pallor.  Psychiatric: He has a normal mood and affect. His behavior is normal.    Assessment/Plan:  1. Acute renal failure on chronic kidney disease stage III T.: I suspect that the patient is unfortunately likely lost his allograft given the significant interruption with immunosuppression. He remains very insistent that he has had such an episode in the past and does not want to entertain any ideas about dialysis at this time. We'll empirically check a urinalysis/urine electrolytes and continue the intravenous fluids for now for possible volume resuscitation-although clinically, the patient does not appear to be volume depleted. I will use isotonic sodium bicarbonate given his hyperkalemia and metabolic acidosis-both from acute on chronic renal failure. Renal ultrasound including his allograft will be done. Upon his insistence, I will restart his antirejection therapy as the patient believes that this will salvage his allograft function. It is possible that we will have a better clinical picture as to regarding prognosis/status of the renal allograft within the next 24-48 hours. 2. Hyperkalemia: Secondary to acute renal failure/failing allograft. We'll try medical management for now with Kayexalate and intravenous fluids. No EKG changes noted. 3. Anion gap metabolic acidosis: Secondary to acute renal failure, will start on isotonic sodium bicarbonate at this time and monitor response to therapy. 4. Anemia of chronic kidney disease: We'll check iron stores and ferritin, ESA therapy will be considered when hemoglobin declines less than 10 g/dL. Intravenous iron will be supplemented if required. 5. Syncopal episode/fall/LOC: Ongoing workup per the hospitalist service. These would not be consistent with "uremia" unless associated with a significant pericardial effusion-unlikely from his chest  x-ray findings.  Esias Mory K. 08/31/2012, 6:12 PM

## 2012-08-31 NOTE — ED Notes (Signed)
Pt denies any urinary symptoms, reports he has been urinating per usual

## 2012-08-31 NOTE — ED Notes (Signed)
Patient reports that he was standing, became SOB and had LOC for an unknown period of time. Patient hit the back of his head. Patient denies N/V, but states that he has been very lethargic.

## 2012-08-31 NOTE — ED Provider Notes (Signed)
History     CSN: [id number]  Arrival date & time 08/31/12  1307   First MD Initiated Contact with Patient 08/31/12 1327      Chief Complaint  Patient presents with  . Loss of Consciousness  . Head Injury    (Consider location/radiation/quality/duration/timing/severity/associated sxs/prior treatment) HPI Comments: Patient is a 47 year old male with a past medical history of renal transplant who presents after a syncopal episode that occurred yesterday. Patient reports putting his coat on to go outside and suddenly felt SOB and then experienced LOC. Patient reports falling and hitting the back of his head on his coffee table. Patient reports being unconscious for about 2 hours. The episode was unwitnessed. Patient reports associated fatigue. Patient reports being incarcerated in November and has not taken his medications, Cellcept and Prograf, since then. No aggravating/alleviating factors.    Past Medical History  Diagnosis Date  . Hypertension   . Renal failure   . Depression     Past Surgical History  Procedure Laterality Date  . Kidney receipient    . Nephrectomy transplanted organ      History reviewed. No pertinent family history.  History  Substance Use Topics  . Smoking status: Never Smoker   . Smokeless tobacco: Never Used  . Alcohol Use: No      Review of Systems  Constitutional: Positive for fatigue.  Respiratory: Positive for shortness of breath.   Neurological: Positive for syncope.  All other systems reviewed and are negative.    Allergies  Darvocet  Home Medications   Current Outpatient Rx  Name  Route  Sig  Dispense  Refill  . atenolol (TENORMIN) 50 MG tablet   Oral   Take 50 mg by mouth daily.         [name] labetalol (NORMODYNE) 200 MG tablet   Oral   Take 200 mg by mouth 2 (two) times daily.         . mycophenolate (CELLCEPT) 500 MG tablet   Oral   Take 500 mg by mouth 2 (two) times daily.         . tacrolimus (PROGRAF) 1 MG  capsule   Oral   Take 3 mg by mouth 2 (two) times daily.         . tacrolimus (PROGRAF) 5 MG capsule   Oral   Take 5 mg by mouth 2 (two) times daily.           BP 147/94  Pulse 62  Temp(Src) 97.4 F (36.3 C) (Oral)  Resp 18  SpO2 99%  Physical Exam  Nursing note and vitals reviewed. Constitutional: He is oriented to person, place, and time. He appears well-developed and well-nourished. No distress.  HENT:  Head: Normocephalic and atraumatic.  Mouth/Throat: Oropharynx is clear and moist. No oropharyngeal exudate.  Eyes: Conjunctivae and EOM are normal. Pupils are equal, round, and reactive to light. No scleral icterus.  Neck: Normal range of motion. Neck supple.  Cardiovascular: Normal rate and regular rhythm.  Exam reveals no gallop and no friction rub.   No murmur heard. Pulmonary/Chest: Effort normal and breath sounds normal. He has no wheezes. He has no rales. He exhibits no tenderness.  Abdominal: Soft. He exhibits no distension. There is no tenderness. There is no rebound and no guarding.  Musculoskeletal: Normal range of motion.  Neurological: He is alert and oriented to person, place, and time. No cranial nerve deficit. Coordination normal.  Strength and sensation equal and intact bilaterally. Speech is  goal-oriented. Moves limbs without ataxia.   Skin: Skin is warm and dry.  Psychiatric: He has a normal mood and affect. His behavior is normal.    ED Course  Procedures (including critical care time)   Date: 08/31/2012  Rate: 60  Rhythm: normal sinus rhythm  QRS Axis: normal  Intervals: normal  ST/T Wave abnormalities: normal  Conduction Disutrbances:none  Narrative Interpretation: NSR with no previous for comparison  Old EKG Reviewed: none available    Labs Reviewed  CBC WITH DIFFERENTIAL - Abnormal; Notable for the following:    RBC 3.96 (*)    Hemoglobin 10.7 (*)    HCT 32.1 (*)    Platelets 419 (*)    All other components within normal limits   BASIC METABOLIC PANEL - Abnormal; Notable for the following:    Sodium 126 (*)    Potassium 6.5 (*)    Chloride 92 (*)    CO2 13 (*)    BUN 140 (*)    Creatinine, Ser 20.77 (*)    GFR calc non Af Amer 2 (*)    GFR calc Af Amer 3 (*)    All other components within normal limits  URINALYSIS, ROUTINE W REFLEX MICROSCOPIC  POCT I-STAT TROPONIN I   Dg Chest 2 View  08/31/2012  *RADIOLOGY REPORT*  Clinical Data: Loss of consciousness.  History of hypertension.  CHEST - 2 VIEW  Comparison: None.  Findings: Artifact overlies chest.  Heart size is normal. Mediastinal shadows are normal.  The lungs are clear.  Vascularity is normal.  No significant bony finding.  IMPRESSION: No active disease.   Original Report Authenticated By: [name], M.D.    Ct Head Wo Contrast  08/31/2012  *RADIOLOGY REPORT*  Clinical Data: Syncope.  Fell with head trauma.  CT HEAD WITHOUT CONTRAST  Technique:  Contiguous axial images were obtained from the base of the skull through the vertex without contrast.  Comparison: None.  Findings: The brain has a normal appearance without evidence of malformation, atrophy, old or acute infarction, mass lesion, hemorrhage, hydrocephalus or extra-axial collection.  The calvarium is unremarkable.  Sinuses, middle ears and mastoids are clear.  IMPRESSION: Normal head CT   Original Report Authenticated By: [name], M.D.      1. Renal failure       MDM  2:52 PM Labs, CT head, chest xray pending.   6:46 PM Labs show renal failure. Patient reports he has not taken his Prograf or Cellcept in 3 months which is likely the cause of his renal failure. CT head unremarkable. Patient will be admitted to Hospitalist at [facility]. Dr. Florene Glen contacted and will see the patient.     Alvina Chou, PA-C 08/31/12 [phone]

## 2012-08-31 NOTE — Progress Notes (Signed)
[facility] ED Cm noted Cm consult for trouble affording medications. Spoke with pt who confirms he is having trouble affording all of his medications not certain ones Pt states he has applied for "medicaid and medicare" but pending "response". "they are suppose to let me know in the next few weeks".  Cm reviewed the [place] program (assistance one time, each rx co pay of $3 with his choice of pharmacy from a list of participating [facility] pharmacies) Pt voiced understanding. Cm discussed with him that unit cm would check to see which medications he would be d/c home with and provided [facility] letter as needed prior to d/c

## 2012-08-31 NOTE — ED Notes (Signed)
[facility] called for pt transportation to [facility]

## 2012-09-01 ENCOUNTER — Encounter (HOSPITAL_COMMUNITY): Payer: Self-pay | Admitting: *Deleted

## 2012-09-01 ENCOUNTER — Inpatient Hospital Stay (HOSPITAL_COMMUNITY): Payer: Medicare Other

## 2012-09-01 DIAGNOSIS — R55 Syncope and collapse: Secondary | ICD-10-CM

## 2012-09-01 DIAGNOSIS — N19 Unspecified kidney failure: Secondary | ICD-10-CM

## 2012-09-01 LAB — CBC
HCT: 27.5 % — ABNORMAL LOW (ref 39.0–52.0)
Hemoglobin: 9.2 g/dL — ABNORMAL LOW (ref 13.0–17.0)
MCHC: 33.5 g/dL (ref 30.0–36.0)
RDW: 13.3 % (ref 11.5–15.5)
WBC: 7.1 10*3/uL (ref 4.0–10.5)

## 2012-09-01 LAB — BASIC METABOLIC PANEL
BUN: 146 mg/dL — ABNORMAL HIGH (ref 6–23)
Chloride: 93 mEq/L — ABNORMAL LOW (ref 96–112)
GFR calc Af Amer: 3 mL/min — ABNORMAL LOW (ref >90)
GFR calc non Af Amer: 2 mL/min — ABNORMAL LOW (ref >90)
Glucose, Bld: 135 mg/dL — ABNORMAL HIGH (ref 70–99)
Potassium: 5.4 mEq/L — ABNORMAL HIGH (ref 3.5–5.1)
Sodium: 130 mEq/L — ABNORMAL LOW (ref 135–145)

## 2012-09-01 LAB — RENAL FUNCTION PANEL
Albumin: 2.6 g/dL — ABNORMAL LOW (ref 3.5–5.2)
CO2: 16 mEq/L — ABNORMAL LOW (ref 19–32)
Chloride: 91 mEq/L — ABNORMAL LOW (ref 96–112)
GFR calc Af Amer: 3 mL/min — ABNORMAL LOW (ref >90)
GFR calc non Af Amer: 2 mL/min — ABNORMAL LOW (ref >90)
Potassium: 5.3 mEq/L — ABNORMAL HIGH (ref 3.5–5.1)
Sodium: 130 mEq/L — ABNORMAL LOW (ref 135–145)

## 2012-09-01 LAB — CREATININE, URINE, RANDOM: Creatinine, Urine: 62.81 mg/dL

## 2012-09-01 MED ORDER — AMLODIPINE BESYLATE 5 MG PO TABS
5.0000 mg | ORAL_TABLET | Freq: Every day | ORAL | Status: DC
  Administered 2012-09-01 – 2012-09-03 (×3): 5 mg via ORAL
  Filled 2012-09-01 (×4): qty 1

## 2012-09-01 MED ORDER — SODIUM POLYSTYRENE SULFONATE 15 GM/60ML PO SUSP
15.0000 g | Freq: Once | ORAL | Status: AC
  Administered 2012-09-01: 15 g via ORAL
  Filled 2012-09-01: qty 60

## 2012-09-01 MED ORDER — METHYLPREDNISOLONE SODIUM SUCC 125 MG IJ SOLR
60.0000 mg | Freq: Three times a day (TID) | INTRAMUSCULAR | Status: DC
  Administered 2012-09-01 (×2): 60 mg via INTRAVENOUS
  Filled 2012-09-01 (×6): qty 0.96

## 2012-09-01 NOTE — Progress Notes (Signed)
PROGRESS NOTE  Frank [name] KHT:[id number] DOB: [birth year] DOA: 08/31/2012 PCP: Adin Hector, Student  Brief narrative:  47 yr old AAM, s/p deceased dononr renal transplant at [facility] in 2006-Htn was etiology..  Presented to [facility] ED 2.10 due to a syncopal episode-lives currently with sister for the past 1 year.  Prior to this was living in [place].  Recent incarcerated for violating a protecitve order and got 40 days for this and was released Nov 22nd.   [facility]-was paying 20% of the cost-[place] the rest   Past medical history-As per Problem list Chart reviewed as below- None in chart  Consultants: Nephrology  Procedures:  cxr 2.12-No active disease  Ct head 2.10 Normal head CT  Renal US pending  Antibiotics: none  Subjective  Feels pretty good today-hasn't had much sleep.  Iv that he had made his hand swollen up. Doesn't wish IV right as he has some pain never had these episodes of syncope before-he fell on his back on his shoudler and back Passing urine currently No Cp/SOB/Fever Has been ambulant and getting up out  Feels we are not being "aggressive enough" to tyr to save the kideny, but has refused IV placement 2-3 tiems today  Objective    Interim History: none  Telemetry: nad  Objective: Filed Vitals:   08/31/12 1928 08/31/12 1945 08/31/12 2045 09/01/12 0538  BP: 166/104 157/91 165/89 141/82  Pulse: 70  67 85  Temp: 98.9 F (37.2 C)  98 F (36.7 C) 98.3 F (36.8 C)  TempSrc: Oral  Oral Oral  Resp: _0 Height:   _1  (1.88 m)   Weight:   82.373 kg (181 lb 9.6 oz)   SpO2: 99%  98% 99%   No intake or output data in the 24 hours ending 09/01/12 1216  Exam:  General: alert, soft spoken Cardiovascular: s1 s2 no m/r/g Respiratory: c;ear Abdomen: soft/nt/nd Skinnad Neuro grossly intact  Data Reviewed: Basic Metabolic Panel:  Recent Labs Lab 08/31/12 1335  09/01/12 0005 09/01/12 0600  NA 126* 130* 130*  K 6.5* 5.4* 5.3*  CL 92* 93* 91*  CO2 13* 17* 16*  GLUCOSE 93 135* 123*  BUN 140* 146* 145*  CREATININE 20.77* 20.72* 20.91*  CALCIUM 9.5 8.9 8.4  PHOS  --   --  8.1*   Liver Function Tests:  Recent Labs Lab 09/01/12 0600  ALBUMIN 2.6*   No results found for this basename: LIPASE, AMYLASE,  in the last 168 hours No results found for this basename: AMMONIA,  in the last 168 hours CBC:  Recent Labs Lab 08/31/12 1335 09/01/12 0005  WBC 7.7 7.1  NEUTROABS 5.6  --   HGB 10.7* 9.2*  HCT 32.1* 27.5*  MCV 81.1 78.8  PLT 419* 350   Cardiac Enzymes: No results found for this basename: CKTOTAL, CKMB, CKMBINDEX, TROPONINI,  in the last 168 hours BNP: No components found with this basename: POCBNP,  CBG:  Recent Labs Lab 08/31/12 1954  GLUCAP 135*    No results found for this or any previous visit (from the past 240 hour(s)).   Studies:              All Imaging reviewed and is as per above notation   Scheduled Meds: . enoxaparin (LOVENOX) injection  30 mg Subcutaneous Q24H  . labetalol  200 mg Oral BID  . methylPREDNISolone (SOLU-MEDROL) injection  60 mg Intravenous TID  . mycophenolate  500 mg Oral BID  . tacrolimus  8 mg Oral BID   Continuous Infusions: .  sodium bicarbonate 150 mEq in sterile water 1000 mL infusion 100 mL/hr at 08/31/12 1929     Assessment/Plan: 1. AKI c Stage 3 CKG-likely will be diaysis dependant-unclear if long-term utility of immunomodulator-defer to renal service 2. S/p Renal transplant 1996-see above 3. Metabolic acidosis 2/2 to renal failure-continue Bicarb Gtt when patient gets IV access.   4. Hyperkalemia-Receiving Kayexalate  15gm daily-rpt Bmet q 12 , but patient prefers not to be stuck so often, so rpt in am 5. Orthostasis and syncope-likely volume depltion related-Echo performed and pending-orthostatics not grossly + 6. Elevated blood sugars without diag of DM-monitor  closely 7. Htn-Needs better control-would start amlodipine 5 mg daily 8. Hyponatremia-2/2 to #1-per renal service  Code Status: Full  Family Communication: none at bedside  Disposition Plan: inpatient   Verneita Griffes, MD  [facility] Pager [phone] 09/01/2012, 12:16 PM    LOS: 1 day

## 2012-09-01 NOTE — Progress Notes (Signed)
IV team here to restart pt's IV. Pt did not want IV nurse to restart his IV at this time, because he wanted to eat lunch. IV nurse informed MD who had entered the room. This is second attempt to restart. Pt refused earlier this AM because he was very tired and wanted to sleep some more. IV team to be paged again when pt is done eating.   [place], Franky Macho

## 2012-09-01 NOTE — ED Provider Notes (Signed)
Medical screening examination/treatment/procedure(s) were conducted as a shared visit with non-physician practitioner(s) and myself.  I personally evaluated the patient during the encounter.  Status post renal transplant approximately years ago. Has not been taking his transplant medicines. Creatinine grossly elevated. Admit to nephrologist  Nat Christen, MD 09/01/12 [phone]

## 2012-09-01 NOTE — Progress Notes (Signed)
Assessment/Plan:  1. Acute renal failure on chronic kidney disease stage III   Will pulse with steroids and reeducate about dialysis.  He prefers PD.  Will get transplant u/s. 2. Hyperkalemia: Secondary to acute renal failure & failing allograft.  3. Anion gap metabolic acidosis: Secondary to acute renal failure. 4. Anemia of chronic kidney disease 5. Syncopal episode/fall/LOC  Subjective: Interval History:Claims he feels better Objective: Vital signs in last 24 hours: Temp:  [97.4 F (36.3 C)-98.9 F (37.2 C)] 98.3 F (36.8 C) (02/11 0538) Pulse Rate:  [62-85] 85 (02/11 0538) Resp:  [15-20] 18 (02/11 0538) BP: (141-166)/(82-104) 141/82 mmHg (02/11 0538) SpO2:  [98 %-99 %] 99 % (02/11 0538) Weight:  [82.373 kg (181 lb 9.6 oz)] 82.373 kg (181 lb 9.6 oz) (02/10 2045) Weight change:  Intake/Output from previous day:   Intake/Output this shift:   General appearance: alert and cooperative Resp: clear to auscultation bilaterally Cardio: regular rate and rhythm Extremities: extremities normal, atraumatic, no cyanosis or edema  Lab Results:  Recent Labs  08/31/12 1335 09/01/12 0005  WBC 7.7 7.1  HGB 10.7* 9.2*  HCT 32.1* 27.5*  PLT 419* 350   BMET:  Recent Labs  09/01/12 0005 09/01/12 0600  NA 130* 130*  K 5.4* 5.3*  CL 93* 91*  CO2 17* 16*  GLUCOSE 135* 123*  BUN 146* 145*  CREATININE 20.72* 20.91*  CALCIUM 8.9 8.4   No results found for this basename: PTH,  in the last 72 hours Iron Studies: No results found for this basename: IRON, TIBC, TRANSFERRIN, FERRITIN,  in the last 72 hours Studies/Results: Dg Chest 2 View  08/31/2012  *RADIOLOGY REPORT*  Clinical Data: Loss of consciousness.  History of hypertension.  CHEST - 2 VIEW  Comparison: None.  Findings: Artifact overlies chest.  Heart size is normal. Mediastinal shadows are normal.  The lungs are clear.  Vascularity is normal.  No significant bony finding.  IMPRESSION: No active disease.   Original Report  Authenticated By: [name], M.D.    Ct Head Wo Contrast  08/31/2012  *RADIOLOGY REPORT*  Clinical Data: Syncope.  Fell with head trauma.  CT HEAD WITHOUT CONTRAST  Technique:  Contiguous axial images were obtained from the base of the skull through the vertex without contrast.  Comparison: None.  Findings: The brain has a normal appearance without evidence of malformation, atrophy, old or acute infarction, mass lesion, hemorrhage, hydrocephalus or extra-axial collection.  The calvarium is unremarkable.  Sinuses, middle ears and mastoids are clear.  IMPRESSION: Normal head CT   Original Report Authenticated By: [name], M.D.    Scheduled: . enoxaparin (LOVENOX) injection  30 mg Subcutaneous Q24H  . labetalol  200 mg Oral BID  . mycophenolate  500 mg Oral BID  . tacrolimus  8 mg Oral BID     LOS: 1 day   De Libman C 09/01/2012,8:36 AM

## 2012-09-01 NOTE — Progress Notes (Signed)
  Echocardiogram 2D Echocardiogram has been performed.  Ardelle Balls A 09/01/2012, 11:02 AM

## 2012-09-01 NOTE — Progress Notes (Signed)
Peritoneal dialysis video offered to pt to watch. Pt declined to watch at this time, but stated he would watch it later tonight. [place], Franky Macho

## 2012-09-02 ENCOUNTER — Encounter (HOSPITAL_COMMUNITY): Payer: Self-pay | Admitting: Radiology

## 2012-09-02 DIAGNOSIS — N039 Chronic nephritic syndrome with unspecified morphologic changes: Secondary | ICD-10-CM

## 2012-09-02 DIAGNOSIS — F329 Major depressive disorder, single episode, unspecified: Secondary | ICD-10-CM

## 2012-09-02 DIAGNOSIS — N186 End stage renal disease: Secondary | ICD-10-CM

## 2012-09-02 LAB — URINE CULTURE

## 2012-09-02 LAB — BASIC METABOLIC PANEL
BUN: 155 mg/dL — ABNORMAL HIGH (ref 6–23)
Calcium: 8.3 mg/dL — ABNORMAL LOW (ref 8.4–10.5)
Calcium: 8.6 mg/dL (ref 8.4–10.5)
Creatinine, Ser: 21.93 mg/dL — ABNORMAL HIGH (ref 0.50–1.35)
GFR calc Af Amer: 2 mL/min — ABNORMAL LOW (ref >90)
GFR calc non Af Amer: 2 mL/min — ABNORMAL LOW (ref >90)
GFR calc non Af Amer: 2 mL/min — ABNORMAL LOW (ref >90)
Potassium: 5.6 mEq/L — ABNORMAL HIGH (ref 3.5–5.1)
Sodium: 129 mEq/L — ABNORMAL LOW (ref 135–145)
Sodium: 132 mEq/L — ABNORMAL LOW (ref 135–145)

## 2012-09-02 LAB — PROTIME-INR
INR: 1.23 (ref 0.00–1.49)
Prothrombin Time: 15.3 seconds — ABNORMAL HIGH (ref 11.6–15.2)

## 2012-09-02 MED ORDER — SODIUM BICARBONATE 650 MG PO TABS
650.0000 mg | ORAL_TABLET | Freq: Two times a day (BID) | ORAL | Status: DC
  Administered 2012-09-02: 650 mg via ORAL
  Filled 2012-09-02: qty 1

## 2012-09-02 MED ORDER — SODIUM BICARBONATE 650 MG PO TABS: 650.0000 mg | ORAL_TABLET | Freq: Three times a day (TID) | ORAL | Status: DC

## 2012-09-02 MED ORDER — SODIUM CHLORIDE 0.9 % IV SOLN: 75.0000 mL | INTRAVENOUS | Status: DC

## 2012-09-02 MED ORDER — AMLODIPINE BESYLATE 5 MG PO TABS: 10.0000 mg | ORAL_TABLET | Freq: Every day | ORAL | Status: DC

## 2012-09-02 MED ORDER — SODIUM POLYSTYRENE SULFONATE 15 GM/60ML PO SUSP: 15.0000 g | Freq: Once | ORAL | Status: DC

## 2012-09-02 MED ORDER — SODIUM POLYSTYRENE SULFONATE 15 GM/60ML PO SUSP
45.0000 g | Freq: Once | ORAL | Status: AC
  Administered 2012-09-02: 45 g via ORAL
  Filled 2012-09-02: qty 180

## 2012-09-02 MED ORDER — SODIUM CHLORIDE 0.9 % IV SOLN
1.0000 g | Freq: Once | INTRAVENOUS | Status: AC
  Administered 2012-09-02: 1 g via INTRAVENOUS
  Filled 2012-09-02: qty 10

## 2012-09-02 MED ORDER — DARBEPOETIN ALFA-POLYSORBATE 150 MCG/0.3ML IJ SOLN
150.0000 ug | INTRAMUSCULAR | Status: DC
  Filled 2012-09-02 (×2): qty 0.3

## 2012-09-02 MED ORDER — SODIUM BICARBONATE 650 MG PO TABS
650.0000 mg | ORAL_TABLET | Freq: Three times a day (TID) | ORAL | Status: DC
  Administered 2012-09-02 – 2012-09-03 (×2): 650 mg via ORAL
  Filled 2012-09-02 (×4): qty 1

## 2012-09-02 MED ORDER — SODIUM CHLORIDE 0.9 % IV SOLN
INTRAVENOUS | Status: DC
  Administered 2012-09-02 (×2): via INTRAVENOUS

## 2012-09-02 MED ORDER — DARBEPOETIN ALFA-POLYSORBATE 150 MCG/0.3ML IJ SOLN: 150.0000 ug | INTRAMUSCULAR | Status: DC

## 2012-09-02 NOTE — Progress Notes (Signed)
Informed by day shift charge nurse that patient had signed consent portion of COBRA to be transferred to [facility] but that patient needed to speak with family member first before we called ambulance for transport. Went into patient's room at approximately 2125. Pt was on phone at the time, he paused as I introduced myself and explained I was following up on if he was ready to be transported. He told me that he was "really stressed out, this hospital has been stressing me all day". I told him that I would assist him with dialing his family member. He told me that he was on one call already but would let me help him in a few minutes. He also stated that he had not received any food for dinner. I did not a partially eaten piece of cake on his overbed table. While i was present in the room the NT brought in the patient's bedtime snack and a staff member from dietary brought in a dinner tray for the patient. The patient got off the phone and explained to me what events had taken place during the day and that he felt that "we were pressuring him all day, we were causing him to make his blood pressure go up". I apologized to patient for his experience and offered that I understood he wanted to be cared for by a different physician at a different hospital and we had made arrangements for that to take place as quickly as possible. Patient continued to say that it was too late at night and too much of a rush and that he felt like it would be best for him to stay the night and take care of this in the morning. I informed patient that I would notify the physician here and notify [facility] to release the bed at this time. Call placed to Tylene Fantasia NP on call and I notified her that patient wants to stay tonight and discuss transfer again in the morning.

## 2012-09-02 NOTE — Progress Notes (Signed)
During [facility] ED 08/31/12 visit pt was seen by [facility] liaison  Pt offered services to assist with finding a [place] self pay provider, resources & health reform information

## 2012-09-02 NOTE — Progress Notes (Signed)
[facility] PROGRESS NOTE  Frank [name] VFI:[id number] DOB: February 03, 1966 DOA: 08/31/2012 PCP: Adin Hector, Student  Assessment/Plan: Frank [name] failure: acute on chronic; patient with hx of transplant, not on medications for the last 30-40 days (patietn was in jail and then insurance was canceled). At this Point Cr and BUN continue to rise; patient asymptomatic at this moment. Will continue kayexalate, bicar and gentle fluids. Patient is refusing tunnel cath for HD at this moment. Renal on board, will follow recommendations  2-HTN: fair control, continue amlodipine  3-Hyperkalemia: secondary to renal failure; continue kayexalate; K 6.7 today  4-metabolic acidosis: due to renal failure, continue bicarb tablets  5-AOCD: Hgb 9.2; per renal will start Aranesp tx.   Code Status: Full Family Communication: no family at bedside Disposition Plan: home when medically stable   Consultants:  Renal  IR  Surgery  Procedures:  Pending Tunnel perm cath  Peritoneal dialysis cath    Antibiotics:  none  HPI/Subjective: Afebrile, no CP, no SOB. Patient reports having couple BM's and also multiple episodes of urination    Objective: Filed Vitals:   09/01/12 2321 09/02/12 1018 09/02/12 1019 09/02/12 1020  BP: 151/88 144/91 142/91 150/101  Pulse: 85 77 79 88  Temp: 97.2 F (36.2 C) 97.5 F (36.4 C)    TempSrc: Oral Oral    Resp: 19 18    Height:      Weight: 83.6 kg (184 lb 4.9 oz)     SpO2: 100% 99%      Intake/Output Summary (Last 24 hours) at 09/02/12 1442 Last data filed at 09/02/12 0739  Gross per 24 hour  Intake      0 ml  Output    300 ml  Net   -300 ml   Filed Weights   08/31/12 2045 09/01/12 2321  Weight: 82.373 kg (181 lb 9.6 oz) 83.6 kg (184 lb 4.9 oz)    Exam:   General:  NAD; afebrile  Cardiovascular: S1 and S2, no murmur, no gallops  Respiratory: CTA  Abdomen: soft, NT, ND, positive BS  Extremities: no edema  Neuro: non  focal  Data Reviewed: Basic Metabolic Panel:  Recent Labs Lab 08/31/12 1335 09/01/12 0005 09/01/12 0600 09/02/12 0500  NA 126* 130* 130* 129*  K 6.5* 5.4* 5.3* 6.7*  CL 92* 93* 91* 90*  CO2 13* 17* 16* 15*  GLUCOSE 93 135* 123* 194*  BUN 140* 146* 145* 152*  CREATININE 20.77* 20.72* 20.91* 21.93*  CALCIUM 9.5 8.9 8.4 8.3*  PHOS  --   --  8.1*  --    Liver Function Tests:  Recent Labs Lab 09/01/12 0600  ALBUMIN 2.6*   CBC:  Recent Labs Lab 08/31/12 1335 09/01/12 0005  WBC 7.7 7.1  NEUTROABS 5.6  --   HGB 10.7* 9.2*  HCT 32.1* 27.5*  MCV 81.1 78.8  PLT 419* 350   CBG:  Recent Labs Lab 08/31/12 1954  GLUCAP 135*    Recent Results (from the past 240 hour(s))  URINE CULTURE     Status: None   Collection Time    08/31/12  7:48 PM      Result Value Range Status   Specimen Description URINE, CLEAN CATCH   Final   Special Requests NONE   Final   Culture  Setup Time 09/01/2012 04:04   Final   Colony Count NO GROWTH   Final   Culture NO GROWTH   Final   Report Status 09/02/2012 FINAL   Final  Studies: Ct Head Wo Contrast  08/31/2012  *RADIOLOGY REPORT*  Clinical Data: Syncope.  Fell with head trauma.  CT HEAD WITHOUT CONTRAST  Technique:  Contiguous axial images were obtained from the base of the skull through the vertex without contrast.  Comparison: None.  Findings: The brain has a normal appearance without evidence of malformation, atrophy, old or acute infarction, mass lesion, hemorrhage, hydrocephalus or extra-axial collection.  The calvarium is unremarkable.  Sinuses, middle ears and mastoids are clear.  IMPRESSION: Normal head CT   Original Report Authenticated By: [name], M.D.    US Renal Transplant W/doppler  09/01/2012  *RADIOLOGY REPORT*  Clinical Data:  History of renal transplant, evaluate for rejection  ULTRASOUND OF RENAL TRANSPLANT  Technique: Ultrasound examination of the renal transplant was performed, with color and duplex Doppler  evaluation.  Comparison:  CT abdomen pelvis - 10/31/2005  Findings:  Transplant kidney location:  Left lower abdominal quadrant  Transplant kidney description:  Normal cortical thickness, echogenicity and size, measuring 15.9 cm in length.  Note is made of an approximately 1.9 x 1.0 x 1.5 cm anechoic cyst within the superior pole of the transplanted kidney.  No echogenic renal stones.  There is mild to very moderate pelvocaliectasis within the left lower quadrant transplant kidney.  Color flow in the main renal artery at the hilum:  Present  Color flow in the main renal vein at the hilum:  Present  Resistive indices:       Main renal artery:  0.67       Upper pole segmental renal artery: 0.63       Lower pole segmental renal artery:  0.71  Bladder:  Normal given degree of distension.  IMPRESSION: 1.  Mild to very moderate pelvocaliectasis within the left lower quadrant transplant kidney, likely similar to remote noncontrast abdominal CT performed 10/31/2005.  No definite perinephric stranding. 2.  Normal resistive indices without definitive evidence of rejection.  3. Approximately 1.9 cm cyst within the superior pole of the transplanted kidney.   Original Report Authenticated By: [name], MD     Scheduled Meds: . amLODipine  5 mg Oral Daily  . darbepoetin (ARANESP) injection - DIALYSIS  150 mcg Intravenous Q Wed-HD  . labetalol  200 mg Oral BID  . mycophenolate  500 mg Oral BID  . sodium polystyrene  15 g Oral Once  . tacrolimus  8 mg Oral BID   Continuous Infusions: . sodium chloride 75 mL/hr at 09/02/12 1236    Active Problems:   Acute-on-chronic renal failure   Hyperkalemia   Metabolic acidosis   Hypertension   Syncope    Time spent: >30 minutes    Frank [name]  [facility] Pager [phone]. If 8PM-8AM, please contact night-coverage at [email or web address], password [facility] 09/02/2012, 2:42 PM  LOS: 2 days

## 2012-09-02 NOTE — Progress Notes (Signed)
CRITICAL VALUE ALERT  Critical value received:  K=6.7  Date of notification:  09/02/12  Time of notification:  0630  Critical value read back:yes  Nurse who received alert:  Vivia Budge  MD notified (1st page):  Forrest Moron, NP  Time of first page:  [phone]  MD notified (2nd page): n/a  Time of second page: n/a  Responding MD:  Forrest Moron, Np  Time MD responded:  [phone]

## 2012-09-02 NOTE — Consult Note (Signed)
Frank [name] [birth year]  [id number].   Requesting MD: Dr. Erling Cruz Chief Complaint/Reason for Consult: need for CAPD catheter HPI: This is a 46 yo male who has renal failure secondary to noncompliance with his antihypertensive medications who was on CAPD over 8 years ago until he received a transplant kidney.  This has now failed him and he has been admitted to the hospital.  He currently has a creatinine of 22, Cl of 90, and K of 6.7.  The patient is currently refusing HD.  We have been asked to see for placement of a CAPD.  Review of Systems: Please see HPI, otherwise negative  History reviewed. No pertinent family history.  Past Medical History  Diagnosis Date  . Hypertension   . Renal failure   . Depression   . Complication of anesthesia     itching, sore throat    Past Surgical History  Procedure Laterality Date  . Kidney receipient    . Nephrectomy transplanted organ    . Capd insertion    . Capd removal      Social History:  reports that he has never smoked. He has never used smokeless tobacco. He reports that he does not drink alcohol or use illicit drugs.  Allergies:  Allergies  Allergen Reactions  . Darvocet (Propoxyphene-Acetaminophen) Hives    Medications Prior to Admission  Medication Sig Dispense Refill  . atenolol (TENORMIN) 50 MG tablet Take 50 mg by mouth daily.      [name] labetalol (NORMODYNE) 200 MG tablet Take 200 mg by mouth 2 (two) times daily.      . mycophenolate (CELLCEPT) 500 MG tablet Take 500 mg by mouth 2 (two) times daily.      . tacrolimus (PROGRAF) 1 MG capsule Take 3 mg by mouth 2 (two) times daily.      . tacrolimus (PROGRAF) 5 MG capsule Take 5 mg by mouth 2 (two) times daily.        Blood pressure 150/101, pulse 88, temperature 97.5 F (36.4 C), temperature source Oral, resp. rate 18, height _0  (1.88 m), weight 184 lb 4.9 oz (83.6 kg), SpO2 99.00%. Physical Exam: General: WD, WN black male who is laying in bed in NAD HEENT: head  is normocephalic, atraumatic.  Sclera are noninjected.  PERRL.  Ears and nose without any masses or lesions.  Mouth is pink and moist Heart: regular, rate, and rhythm.  Normal s1,s2. No obvious murmurs, gallops, or rubs noted.  Palpable radial and pedal pulses bilaterally Lungs: CTAB, no wheezes, rhonchi, or rales noted.  Respiratory effort nonlabored Abd: soft, NT, ND, +BS, transplant kidney noted in LLQ.  Scars noted on right side of abdomen from prior CAPD catheter.  No hernias noted MS: all 4 extremities are symmetrical with no cyanosis, clubbing, or edema. Skin: warm and dry with no masses, lesions, or rashes Psych: A&Ox3 with an appropriate affect.    Results for orders placed during the hospital encounter of 08/31/12 (from the past 48 hour(s))  CBC WITH DIFFERENTIAL     Status: Abnormal   Collection Time    08/31/12  1:35 PM      Result Value Range   WBC 7.7  4.0 - 10.5 K/uL   RBC 3.96 (*) 4.22 - 5.81 MIL/uL   Hemoglobin 10.7 (*) 13.0 - 17.0 g/dL   HCT 32.1 (*) 39.0 - 52.0 %   MCV 81.1  78.0 - 100.0 fL   MCH 27.0  26.0 - 34.0 pg  MCHC 33.3  30.0 - 36.0 g/dL   RDW 13.2  11.5 - 15.5 %   Platelets 419 (*) 150 - 400 K/uL   Neutrophils Relative 72  43 - 77 %   Neutro Abs 5.6  1.7 - 7.7 K/uL   Lymphocytes Relative 14  12 - 46 %   Lymphs Abs 1.1  0.7 - 4.0 K/uL   Monocytes Relative 10  3 - 12 %   Monocytes Absolute 0.8  0.1 - 1.0 K/uL   Eosinophils Relative 3  0 - 5 %   Eosinophils Absolute 0.2  0.0 - 0.7 K/uL   Basophils Relative 0  0 - 1 %   Basophils Absolute 0.0  0.0 - 0.1 K/uL   WBC Morphology MILD LEFT SHIFT (1-5% METAS, OCC MYELO, OCC BANDS)     Smear Review PLATELET COUNT CONFIRMED BY SMEAR    BASIC METABOLIC PANEL     Status: Abnormal   Collection Time    08/31/12  1:35 PM      Result Value Range   Sodium 126 (*) 135 - 145 mEq/L   Potassium 6.5 (*) 3.5 - 5.1 mEq/L   Comment: RESULT REPEATED AND VERIFIED     CRITICAL RESULT CALLED TO, READ BACK BY AND VERIFIED  WITH:     KHALID,A. AT 1454 ON [id number] BY LOVE,T.   Chloride 92 (*) 96 - 112 mEq/L   CO2 13 (*) 19 - 32 mEq/L   Glucose, Bld 93  70 - 99 mg/dL   BUN 140 (*) 6 - 23 mg/dL   Creatinine, Ser 20.77 (*) 0.50 - 1.35 mg/dL   Calcium 9.5  8.4 - 10.5 mg/dL   GFR calc non Af Amer 2 (*) >90 mL/min   GFR calc Af Amer 3 (*) >90 mL/min   Comment:            The eGFR has been calculated     using the CKD EPI equation.     This calculation has not been     validated in all clinical     situations.     eGFR's persistently     <90 mL/min signify     possible Chronic Kidney Disease.  POCT I-STAT TROPONIN I     Status: None   Collection Time    08/31/12  1:46 PM      Result Value Range   Troponin i, poc 0.00  0.00 - 0.08 ng/mL   Comment 3            Comment: Due to the release kinetics of cTnI,     a negative result within the first hours     of the onset of symptoms does not rule out     myocardial infarction with certainty.     If myocardial infarction is still suspected,     repeat the test at appropriate intervals.  URINALYSIS, ROUTINE W REFLEX MICROSCOPIC     Status: Abnormal   Collection Time    08/31/12  7:48 PM      Result Value Range   Color, Urine YELLOW  YELLOW   APPearance CLOUDY (*) CLEAR   Specific Gravity, Urine 1.011  1.005 - 1.030   pH 6.5  5.0 - 8.0   Glucose, UA NEGATIVE  NEGATIVE mg/dL   Hgb urine dipstick SMALL (*) NEGATIVE   Bilirubin Urine NEGATIVE  NEGATIVE   Ketones, ur NEGATIVE  NEGATIVE mg/dL   Protein, ur 100 (*) NEGATIVE mg/dL   Urobilinogen, UA  0.2  0.0 - 1.0 mg/dL   Nitrite NEGATIVE  NEGATIVE   Leukocytes, UA LARGE (*) NEGATIVE  URINE CULTURE     Status: None   Collection Time    08/31/12  7:48 PM      Result Value Range   Specimen Description URINE, CLEAN CATCH     Special Requests NONE     Culture  Setup Time 09/01/2012 04:04     Colony Count NO GROWTH     Culture NO GROWTH     Report Status 09/02/2012 FINAL    URINE MICROSCOPIC-ADD ON     Status:  Abnormal   Collection Time    08/31/12  7:48 PM      Result Value Range   Squamous Epithelial / LPF FEW (*) RARE   WBC, UA TOO NUMEROUS TO COUNT  <3 WBC/hpf   RBC / HPF 3-6  <3 RBC/hpf   Bacteria, UA FEW (*) RARE  GLUCOSE, CAPILLARY     Status: Abnormal   Collection Time    08/31/12  7:54 PM      Result Value Range   Glucose-Capillary 135 (*) 70 - 99 mg/dL   Comment 1 Notify RN    CBC     Status: Abnormal   Collection Time    09/01/12 12:05 AM      Result Value Range   WBC 7.1  4.0 - 10.5 K/uL   RBC 3.49 (*) 4.22 - 5.81 MIL/uL   Hemoglobin 9.2 (*) 13.0 - 17.0 g/dL   HCT 27.5 (*) 39.0 - 52.0 %   MCV 78.8  78.0 - 100.0 fL   MCH 26.4  26.0 - 34.0 pg   MCHC 33.5  30.0 - 36.0 g/dL   RDW 13.3  11.5 - 15.5 %   Platelets 350  150 - 400 K/uL  BASIC METABOLIC PANEL     Status: Abnormal   Collection Time    09/01/12 12:05 AM      Result Value Range   Sodium 130 (*) 135 - 145 mEq/L   Potassium 5.4 (*) 3.5 - 5.1 mEq/L   Chloride 93 (*) 96 - 112 mEq/L   CO2 17 (*) 19 - 32 mEq/L   Glucose, Bld 135 (*) 70 - 99 mg/dL   BUN 146 (*) 6 - 23 mg/dL   Creatinine, Ser 20.72 (*) 0.50 - 1.35 mg/dL   Calcium 8.9  8.4 - 10.5 mg/dL   GFR calc non Af Amer 2 (*) >90 mL/min   GFR calc Af Amer 3 (*) >90 mL/min   Comment:            The eGFR has been calculated     using the CKD EPI equation.     This calculation has not been     validated in all clinical     situations.     eGFR's persistently     <90 mL/min signify     possible Chronic Kidney Disease.  RENAL FUNCTION PANEL     Status: Abnormal   Collection Time    09/01/12  6:00 AM      Result Value Range   Sodium 130 (*) 135 - 145 mEq/L   Potassium 5.3 (*) 3.5 - 5.1 mEq/L   Chloride 91 (*) 96 - 112 mEq/L   CO2 16 (*) 19 - 32 mEq/L   Glucose, Bld 123 (*) 70 - 99 mg/dL   BUN 145 (*) 6 - 23 mg/dL   Creatinine, Ser 20.91 (*) 0.50 -  1.35 mg/dL   Calcium 8.4  8.4 - 10.5 mg/dL   Phosphorus 8.1 (*) 2.3 - 4.6 mg/dL   Albumin 2.6 (*) 3.5 - 5.2  g/dL   GFR calc non Af Amer 2 (*) >90 mL/min   GFR calc Af Amer 3 (*) >90 mL/min   Comment:            The eGFR has been calculated     using the CKD EPI equation.     This calculation has not been     validated in all clinical     situations.     eGFR's persistently     <90 mL/min signify     possible Chronic Kidney Disease.  SODIUM, URINE, RANDOM     Status: None   Collection Time    09/01/12 10:04 PM      Result Value Range   Sodium, Ur 86    CREATININE, URINE, RANDOM     Status: None   Collection Time    09/01/12 10:04 PM      Result Value Range   Creatinine, Urine 16.96    BASIC METABOLIC PANEL     Status: Abnormal   Collection Time    09/02/12  5:00 AM      Result Value Range   Sodium 129 (*) 135 - 145 mEq/L   Potassium 6.7 (*) 3.5 - 5.1 mEq/L   Comment: DELTA CHECK NOTED     NO VISIBLE HEMOLYSIS     CRITICAL RESULT CALLED TO, READ BACK BY AND VERIFIED WITH:     WICKER K,RN 09/02/12 0618 WAYK   Chloride 90 (*) 96 - 112 mEq/L   CO2 15 (*) 19 - 32 mEq/L   Glucose, Bld 194 (*) 70 - 99 mg/dL   BUN 152 (*) 6 - 23 mg/dL   Creatinine, Ser 21.93 (*) 0.50 - 1.35 mg/dL   Calcium 8.3 (*) 8.4 - 10.5 mg/dL   GFR calc non Af Amer 2 (*) >90 mL/min   GFR calc Af Amer 2 (*) >90 mL/min   Comment:            The eGFR has been calculated     using the CKD EPI equation.     This calculation has not been     validated in all clinical     situations.     eGFR's persistently     <90 mL/min signify     possible Chronic Kidney Disease.  APTT     Status: Abnormal   Collection Time    09/02/12 10:12 AM      Result Value Range   aPTT 43 (*) 24 - 37 seconds   Comment:            IF BASELINE aPTT IS ELEVATED,     SUGGEST PATIENT RISK ASSESSMENT     BE USED TO DETERMINE APPROPRIATE     ANTICOAGULANT THERAPY.  PROTIME-INR     Status: Abnormal   Collection Time    09/02/12 10:12 AM      Result Value Range   Prothrombin Time 15.3 (*) 11.6 - 15.2 seconds   INR 1.23  0.00 - 1.49    Dg Chest 2 View  08/31/2012  *RADIOLOGY REPORT*  Clinical Data: Loss of consciousness.  History of hypertension.  CHEST - 2 VIEW  Comparison: None.  Findings: Artifact overlies chest.  Heart size is normal. Mediastinal shadows are normal.  The lungs are clear.  Vascularity is normal.  No significant  bony finding.  IMPRESSION: No active disease.   Original Report Authenticated By: [name], M.D.    Ct Head Wo Contrast  08/31/2012  *RADIOLOGY REPORT*  Clinical Data: Syncope.  Fell with head trauma.  CT HEAD WITHOUT CONTRAST  Technique:  Contiguous axial images were obtained from the base of the skull through the vertex without contrast.  Comparison: None.  Findings: The brain has a normal appearance without evidence of malformation, atrophy, old or acute infarction, mass lesion, hemorrhage, hydrocephalus or extra-axial collection.  The calvarium is unremarkable.  Sinuses, middle ears and mastoids are clear.  IMPRESSION: Normal head CT   Original Report Authenticated By: [name], M.D.    US Renal Transplant W/doppler  09/01/2012  *RADIOLOGY REPORT*  Clinical Data:  History of renal transplant, evaluate for rejection  ULTRASOUND OF RENAL TRANSPLANT  Technique: Ultrasound examination of the renal transplant was performed, with color and duplex Doppler evaluation.  Comparison:  CT abdomen pelvis - 10/31/2005  Findings:  Transplant kidney location:  Left lower abdominal quadrant  Transplant kidney description:  Normal cortical thickness, echogenicity and size, measuring 15.9 cm in length.  Note is made of an approximately 1.9 x 1.0 x 1.5 cm anechoic cyst within the superior pole of the transplanted kidney.  No echogenic renal stones.  There is mild to very moderate pelvocaliectasis within the left lower quadrant transplant kidney.  Color flow in the main renal artery at the hilum:  Present  Color flow in the main renal vein at the hilum:  Present  Resistive indices:       Main renal artery:  0.67        Upper pole segmental renal artery: 0.63       Lower pole segmental renal artery:  0.71  Bladder:  Normal given degree of distension.  IMPRESSION: 1.  Mild to very moderate pelvocaliectasis within the left lower quadrant transplant kidney, likely similar to remote noncontrast abdominal CT performed 10/31/2005.  No definite perinephric stranding. 2.  Normal resistive indices without definitive evidence of rejection.  3. Approximately 1.9 cm cyst within the superior pole of the transplanted kidney.   Original Report Authenticated By: [name], MD        Assessment/Plan 1. ESRD, needs dialysis 2. HTN 3. Depression 4. Electrolyte abnormalities secondary to renal failure   Plan: 1. I have had a very long d/w the patient.  I have contacted our office and multiple surgeons to try and set up a time to place his CAPD catheter.  We did have an availability tomorrow at noon; however, due to the patient's electrolyte abnormalities and the risks of anaesthesia with these abnormalities, he is not a surgical candidate unless he undergoes HD first to correct these lab values.  The patient currently is adamantly against HD, even temporarily.  I have informed him that he may die if he continues to refuse this plan of care.  He seems aware of this, but denies that this will happen and states that he'll be ok.  I have also informed him that even if he agrees to get HD now in order to get the surgery, that his CAPD catheter can not be used for 2 weeks and he would have to continue HD for the next two weeks as well.  He does not like that option and states he doesn't want to do it.  After our discussion, the patient states that he is going to call [facility] himself to see if they will place  his CAPD catheter.  We will follow for now, but I am unsure that we will have another available surgery time to place this catheter anytime soon.  Shemaiah Round E 09/02/2012, 12:15 PM Pager: [phone]

## 2012-09-02 NOTE — Progress Notes (Signed)
Assessment/Plan:  1. 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 Plan for HD catheter and PD catheter (in hospital) which is his modality of choice. Have asked general surgery to see and IR for [facility]. 2. Hyperkalemia: Worse today, treating with kayexylate since not NPO 3. Anion gap metabolic acidosis: Secondary to renal failure.  4. Anemia of chronic kidney disease, begin ESA Rx 5. Syncopal episode/fall/LOC   Subjective: Interval History: Feeling about the same  Objective: Vital signs in last 24 hours: Temp:  [97.2 F (36.2 C)-98.6 F (37 C)] 97.2 F (36.2 C) (02/11 2321) Pulse Rate:  [70-85] 85 (02/11 2321) Resp:  [19-20] 19 (02/11 2321) BP: (140-151)/(85-96) 151/88 mmHg (02/11 2321) SpO2:  [98 %-100 %] 100 % (02/11 2321) Weight:  [83.6 kg (184 lb 4.9 oz)] 83.6 kg (184 lb 4.9 oz) (02/11 2321) Weight change: 1.227 kg (2 lb 11.3 oz)  Intake/Output from previous day:   Intake/Output this shift: Total I/O In: -  Out: 300 [Urine:300]  General appearance: alert and cooperative Resp: clear to auscultation bilaterally Chest wall: no tenderness Cardio: regular rate and rhythm, S1, S2 normal, no murmur, click, rub or gallop Extremities: extremities normal, atraumatic, no cyanosis or edema  Lab Results:  Recent Labs  08/31/12 1335 09/01/12 0005  WBC 7.7 7.1  HGB 10.7* 9.2*  HCT 32.1* 27.5*  PLT 419* 350   BMET:  Recent Labs  09/01/12 0600 09/02/12 0500  NA 130* 129*  K 5.3* 6.7*  CL 91* 90*  CO2 16* 15*  GLUCOSE 123* 194*  BUN 145* 152*  CREATININE 20.91* 21.93*  CALCIUM 8.4 8.3*   No results found for this basename: PTH,  in the last 72 hours Iron Studies: No results found for this basename: IRON, TIBC, TRANSFERRIN, FERRITIN,  in the last 72 hours Studies/Results: Dg Chest 2 View  08/31/2012  *RADIOLOGY REPORT*  Clinical Data: Loss of consciousness.  History of hypertension.  CHEST - 2 VIEW  Comparison: None.   Findings: Artifact overlies chest.  Heart size is normal. Mediastinal shadows are normal.  The lungs are clear.  Vascularity is normal.  No significant bony finding.  IMPRESSION: No active disease.   Original Report Authenticated By: [name], M.D.    Ct Head Wo Contrast  08/31/2012  *RADIOLOGY REPORT*  Clinical Data: Syncope.  Fell with head trauma.  CT HEAD WITHOUT CONTRAST  Technique:  Contiguous axial images were obtained from the base of the skull through the vertex without contrast.  Comparison: None.  Findings: The brain has a normal appearance without evidence of malformation, atrophy, old or acute infarction, mass lesion, hemorrhage, hydrocephalus or extra-axial collection.  The calvarium is unremarkable.  Sinuses, middle ears and mastoids are clear.  IMPRESSION: Normal head CT   Original Report Authenticated By: [name], M.D.    US Renal Transplant W/doppler  09/01/2012  *RADIOLOGY REPORT*  Clinical Data:  History of renal transplant, evaluate for rejection  ULTRASOUND OF RENAL TRANSPLANT  Technique: Ultrasound examination of the renal transplant was performed, with color and duplex Doppler evaluation.  Comparison:  CT abdomen pelvis - 10/31/2005  Findings:  Transplant kidney location:  Left lower abdominal quadrant  Transplant kidney description:  Normal cortical thickness, echogenicity and size, measuring 15.9 cm in length.  Note is made of an approximately 1.9 x 1.0 x 1.5 cm anechoic cyst within the superior pole of the transplanted kidney.  No echogenic renal  stones.  There is mild to very moderate pelvocaliectasis within the left lower quadrant transplant kidney.  Color flow in the main renal artery at the hilum:  Present  Color flow in the main renal vein at the hilum:  Present  Resistive indices:       Main renal artery:  0.67       Upper pole segmental renal artery: 0.63       Lower pole segmental renal artery:  0.71  Bladder:  Normal given degree of distension.  IMPRESSION: 1.  Mild  to very moderate pelvocaliectasis within the left lower quadrant transplant kidney, likely similar to remote noncontrast abdominal CT performed 10/31/2005.  No definite perinephric stranding. 2.  Normal resistive indices without definitive evidence of rejection.  3. Approximately 1.9 cm cyst within the superior pole of the transplanted kidney.   Original Report Authenticated By: [name], MD    Scheduled: . amLODipine  5 mg Oral Daily  . labetalol  200 mg Oral BID  . mycophenolate  500 mg Oral BID  . sodium polystyrene  15 g Oral Once  . tacrolimus  8 mg Oral BID    LOS: 2 days   Ellin Fitzgibbons C 09/02/2012,9:49 AM

## 2012-09-02 NOTE — Progress Notes (Signed)
RN paged NP 2/2 pt not wanting to leave to go to [facility] as previous planned today. His d/c note has already been completed by the attending. Upon speaking to pt, he said he " couldn't handle talking about this anymore today". He has nothing against [facility] or our care, but he wanted to wait til in the morning to go. I explained that his staying here tonight was consent for treatment by [facility] and our docs-even the nephrologist he didn't want to see anymore. He was gracious and agreed to treatment tonight and was actually thankful for the good care he has received. Clance Boll, NP [facility]

## 2012-09-02 NOTE — Discharge Summary (Addendum)
Physician Discharge Summary  Frank [name] MGQ:[id number] DOB: Jun 25, 1966 DOA: 08/31/2012  PCP: Adin Hector, Student  Admit date: 08/31/2012 Discharge date: 09/03/2012  Time spent: >30 minutes  Recommendations for Outpatient Follow-up:  Needs HD as soon as possible.  Discharge Diagnoses:  Acute-on-chronic renal failure Hyperkalemia Metabolic acidosis Hypertension AOCD Depression GERD   Discharge Condition: stable and improved. NO CP, no SOB, no signs of fluid overload. No acute abnormalities on EKG or telemetry.  Diet recommendation: renal diet  Filed Weights   08/31/12 2045 09/01/12 2321  Weight: 82.373 kg (181 lb 9.6 oz) 83.6 kg (184 lb 4.9 oz)    History of present illness:  47 y.o. male with history of renal transplant 8 years ago at [facility], hypertension who presents with above complaints. He reports that yesterday he had a syncopal episode, admits feeling dizzy prior to the episode. He states he told his sister about it today and she asked him to come to the ED. He denies nausea,vomiting, diarrhea, fevers, dysuria cough ,shortness of breath, leg and no chest pain. He was seen in the ED and a CT scan of his head was negative, and chest x-ray showed no active disease. labs revealed a sodium of 126 with a potassium of 6.5, BUN of 140 creatinine of 20.77 and CO2 of 13. EDP are consulted [place] [facility]-spoke with Dr. Florene Glen who requested admission for [facility] and transfer to [facility]. In the ED he was given a bicarb,insulin, amp of D50 and Kayexalate. He is admitted for further evaluation and management.  The patient states that he went to jail in last November for 45 days and has not taken any of his transplant medications since then. He states his been urinating without difficulty, and has good po intake.   Hospital Course:  1-Renal failure: acute on chronic; patient with hx of transplant, not on medications for the last 30-40 days (patietn was in jail and  then insurance was canceled, unable to afford meds). At this Point Cr and BUN continue to rise; patient remains asymptomatic at this moment for expected degree of uremia. Will continue bicarb tablets, gentle fluids. Patient is refusing tunnel cath for HD at this moment and will like to be transplant to tertiary hospital where transplant team can evaluate him and provide recommendations. Despite discussion about risk of not performing HD ASAP, patient will like to be transferred and continue to refused any procedure in house. Renal team at [facility] contacted and are willing to accept patient in transferred for further evaluation and treatment.  Will continue Prograf and Cellcept as recommended by renal service. To be reevaluated by transplant team at [facility]  2-HTN: fair control, continue amlodipine and labetalol.  3-Hyperkalemia: secondary to renal failure; Received  Kayexalate and miralax; K 4.5 today. No abnormalities on telemetry or EKG for level of hyperkalemia. Patient received calcium gluconate as well.  4-metabolic acidosis: due to renal failure, continue bicarb tablets. Patient needs HD sooner than later. He understand risk and is willing to have HD after been evaluated by transplant team at [facility].   5-AOCD: Hgb 9.2; per renal recommendations plan is to start patient on Aranesp tx with HD.  6-Depression: stable. Was not receiving any medication as an outpatient. Will hold on starting anything for this matter at this point. Reevaluate as an outpatient. No SI or hallucinations  7-GERD: started on Famotidine.   Procedures: See below for x-ray reports  Consultations: Renal  IR (Consult for diatek catheter placement) Surgery (  consult for peritoneal catheter placement)  Discharge Exam: Filed Vitals:   09/02/12 1018 09/02/12 1019 09/02/12 1020 09/02/12 1511  BP: 144/91 142/91 150/101 143/93  Pulse: 77 79 88 99  Temp: 97.5 F (36.4 C)   97.5 F (36.4 C)  TempSrc:  Oral   Oral  Resp: 18   18  Height:      Weight:      SpO2: 99%   96%   General: NAD; afebrile  Cardiovascular: S1 and S2, no murmur, no gallops  Respiratory: CTA  Abdomen: soft, NT, ND, positive BS  Extremities: no edema  Neuro: non focal  Discharge Instructions     Medication List    STOP taking these medications       atenolol 50 MG tablet  Commonly known as:  TENORMIN      TAKE these medications       amLODipine 5 MG tablet  Commonly known as:  NORVASC  Take 2 tablets (10 mg total) by mouth daily.     darbepoetin 150 MCG/0.3ML Soln  Commonly known as:  ARANESP  Inject 0.3 mLs (150 mcg total) into the vein every Wednesday with hemodialysis.     famotidine 40 MG tablet  Commonly known as:  PEPCID  Take 1 tablet (40 mg total) by mouth daily.     labetalol 200 MG tablet  Commonly known as:  NORMODYNE  Take 200 mg by mouth 2 (two) times daily.     mycophenolate 500 MG tablet  Commonly known as:  CELLCEPT  Take 500 mg by mouth 2 (two) times daily.     sodium bicarbonate 650 MG tablet  Take 1 tablet (650 mg total) by mouth 3 (three) times daily.     sodium chloride 0.9 % infusion  Inject 75 mLs into the vein continuous.     tacrolimus 5 MG capsule  Commonly known as:  PROGRAF  Take 5 mg by mouth 2 (two) times daily.     tacrolimus 1 MG capsule  Commonly known as:  PROGRAF  Take 3 mg by mouth 2 (two) times daily.        The results of significant diagnostics from this hospitalization (including imaging, microbiology, ancillary and laboratory) are listed below for reference.    Significant Diagnostic Studies: Dg Chest 2 View  08/31/2012  *RADIOLOGY REPORT*  Clinical Data: Loss of consciousness.  History of hypertension.  CHEST - 2 VIEW  Comparison: None.  Findings: Artifact overlies chest.  Heart size is normal. Mediastinal shadows are normal.  The lungs are clear.  Vascularity is normal.  No significant bony finding.  IMPRESSION: No active disease.    Original Report Authenticated By: [name], M.D.    Ct Head Wo Contrast  08/31/2012  *RADIOLOGY REPORT*  Clinical Data: Syncope.  Fell with head trauma.  CT HEAD WITHOUT CONTRAST  Technique:  Contiguous axial images were obtained from the base of the skull through the vertex without contrast.  Comparison: None.  Findings: The brain has a normal appearance without evidence of malformation, atrophy, old or acute infarction, mass lesion, hemorrhage, hydrocephalus or extra-axial collection.  The calvarium is unremarkable.  Sinuses, middle ears and mastoids are clear.  IMPRESSION: Normal head CT   Original Report Authenticated By: [name], M.D.    US Renal Transplant W/doppler  09/01/2012  *RADIOLOGY REPORT*  Clinical Data:  History of renal transplant, evaluate for rejection  ULTRASOUND OF RENAL TRANSPLANT  Technique: Ultrasound examination of the renal transplant was performed, with  color and duplex Doppler evaluation.  Comparison:  CT abdomen pelvis - 10/31/2005  Findings:  Transplant kidney location:  Left lower abdominal quadrant  Transplant kidney description:  Normal cortical thickness, echogenicity and size, measuring 15.9 cm in length.  Note is made of an approximately 1.9 x 1.0 x 1.5 cm anechoic cyst within the superior pole of the transplanted kidney.  No echogenic renal stones.  There is mild to very moderate pelvocaliectasis within the left lower quadrant transplant kidney.  Color flow in the main renal artery at the hilum:  Present  Color flow in the main renal vein at the hilum:  Present  Resistive indices:       Main renal artery:  0.67       Upper pole segmental renal artery: 0.63       Lower pole segmental renal artery:  0.71  Bladder:  Normal given degree of distension.  IMPRESSION: 1.  Mild to very moderate pelvocaliectasis within the left lower quadrant transplant kidney, likely similar to remote noncontrast abdominal CT performed 10/31/2005.  No definite perinephric stranding. 2.   Normal resistive indices without definitive evidence of rejection.  3. Approximately 1.9 cm cyst within the superior pole of the transplanted kidney.   Original Report Authenticated By: [name], MD     Microbiology: Recent Results (from the past 240 hour(s))  URINE CULTURE     Status: None   Collection Time    08/31/12  7:48 PM      Result Value Range Status   Specimen Description URINE, CLEAN CATCH   Final   Special Requests NONE   Final   Culture  Setup Time 09/01/2012 04:04   Final   Colony Count NO GROWTH   Final   Culture NO GROWTH   Final   Report Status 09/02/2012 FINAL   Final     Labs: Basic Metabolic Panel:  Recent Labs Lab 08/31/12 1335 09/01/12 0005 09/01/12 0600 09/02/12 0500 09/02/12 1418  NA 126* 130* 130* 129* 132*  K 6.5* 5.4* 5.3* 6.7* 5.6*  CL 92* 93* 91* 90* 91*  CO2 13* 17* 16* 15* 14*  GLUCOSE 93 135* 123* 194* 155*  BUN 140* 146* 145* 152* 155*  CREATININE 20.77* 20.72* 20.91* 21.93* 21.97*  CALCIUM 9.5 8.9 8.4 8.3* 8.6  PHOS  --   --  8.1*  --   --    Liver Function Tests:  Recent Labs Lab 09/01/12 0600  ALBUMIN 2.6*   CBC:  Recent Labs Lab 08/31/12 1335 09/01/12 0005  WBC 7.7 7.1  NEUTROABS 5.6  --   HGB 10.7* 9.2*  HCT 32.1* 27.5*  MCV 81.1 78.8  PLT 419* 350   CBG:  Recent Labs Lab 08/31/12 1954  GLUCAP 135*    Signed:  Toma Arts  [facility] 09/02/2012, 6:23 PM

## 2012-09-02 NOTE — H&P (Signed)
HPI: Frank [name] is an 47 y.o. 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 Though pt not convinced graft has failed. He is in need of dialysis and per chart, plans are for tunneled HD catheter and subsequent PD catheter for PD. The pt has had both HD and PD in the past and feels PD is in his best interest. He is currently not agreeable to Permcath placement. He states he is being rushed into procedure and wants to give it 24-48 hrs and talk it over with his family. PMHX , labs, and meds reviewed.  Past Medical History:  Past Medical History  Diagnosis Date  . Hypertension   . Renal failure   . Depression   . Complication of anesthesia     itching, sore throat    Past Surgical History:  Past Surgical History  Procedure Laterality Date  . Kidney receipient    . Nephrectomy transplanted organ      Family History: History reviewed. No pertinent family history.  Social History:  reports that he has never smoked. He has never used smokeless tobacco. He reports that he does not drink alcohol or use illicit drugs.  Allergies:  Allergies  Allergen Reactions  . Darvocet (Propoxyphene-Acetaminophen) Hives    Medications: Medications Prior to Admission  Medication Sig Dispense Refill  . atenolol (TENORMIN) 50 MG tablet Take 50 mg by mouth daily.      [name] labetalol (NORMODYNE) 200 MG tablet Take 200 mg by mouth 2 (two) times daily.      . mycophenolate (CELLCEPT) 500 MG tablet Take 500 mg by mouth 2 (two) times daily.      . tacrolimus (PROGRAF) 1 MG capsule Take 3 mg by mouth 2 (two) times daily.      . tacrolimus (PROGRAF) 5 MG capsule Take 5 mg by mouth 2 (two) times daily.        Please HPI for pertinent positives, otherwise complete 10 system ROS negative.  Physical Exam: Blood pressure 151/88, pulse 85, temperature 97.2 F (36.2 C), temperature source Oral, resp. rate 18, height _0  (1.88 m),  weight 184 lb 4.9 oz (83.6 kg), SpO2 100.00%. Body mass index is 23.65 kg/(m^2).   General Appearance:  Alert, cooperative, no distress, appears stated age  Head:  Normocephalic, without obvious abnormality, atraumatic  ENT: Unremarkable  Neck: Supple, symmetrical, trachea midline.  Lungs:   Clear to auscultation bilaterally, no w/r/r, respirations unlabored without use of accessory muscles.  Chest Wall:  No tenderness or deformity, prior PC scar right upper chest area.  Heart:  Regular rate and rhythm, S1, S2 normal, no murmur, rub or gallop. Carotids 2+ without bruit.  Abdomen:   Soft, non-tender, non distended. Bowel sounds active all four quadrants,  no masses, no organomegaly.  Neurologic: Normal affect, no gross deficits.   Results for orders placed during the hospital encounter of 08/31/12 (from the past 48 hour(s))  CBC WITH DIFFERENTIAL     Status: Abnormal   Collection Time    08/31/12  1:35 PM      Result Value Range   WBC 7.7  4.0 - 10.5 K/uL   RBC 3.96 (*) 4.22 - 5.81 MIL/uL   Hemoglobin 10.7 (*) 13.0 - 17.0 g/dL   HCT 32.1 (*) 39.0 - 52.0 %   MCV 81.1  78.0 - 100.0 fL   MCH 27.0  26.0 - 34.0 pg   MCHC 33.3  30.0 - 36.0 g/dL   RDW 13.2  11.5 - 15.5 %   Platelets 419 (*) 150 - 400 K/uL   Neutrophils Relative 72  43 - 77 %   Neutro Abs 5.6  1.7 - 7.7 K/uL   Lymphocytes Relative 14  12 - 46 %   Lymphs Abs 1.1  0.7 - 4.0 K/uL   Monocytes Relative 10  3 - 12 %   Monocytes Absolute 0.8  0.1 - 1.0 K/uL   Eosinophils Relative 3  0 - 5 %   Eosinophils Absolute 0.2  0.0 - 0.7 K/uL   Basophils Relative 0  0 - 1 %   Basophils Absolute 0.0  0.0 - 0.1 K/uL   WBC Morphology MILD LEFT SHIFT (1-5% METAS, OCC MYELO, OCC BANDS)     Smear Review PLATELET COUNT CONFIRMED BY SMEAR    BASIC METABOLIC PANEL     Status: Abnormal   Collection Time    08/31/12  1:35 PM      Result Value Range   Sodium 126 (*) 135 - 145 mEq/L   Potassium 6.5 (*) 3.5 - 5.1 mEq/L   Comment: RESULT  REPEATED AND VERIFIED     CRITICAL RESULT CALLED TO, READ BACK BY AND VERIFIED WITH:     KHALID,A. AT 1454 ON [id number] BY LOVE,T.   Chloride 92 (*) 96 - 112 mEq/L   CO2 13 (*) 19 - 32 mEq/L   Glucose, Bld 93  70 - 99 mg/dL   BUN 140 (*) 6 - 23 mg/dL   Creatinine, Ser 20.77 (*) 0.50 - 1.35 mg/dL   Calcium 9.5  8.4 - 10.5 mg/dL   GFR calc non Af Amer 2 (*) >90 mL/min   GFR calc Af Amer 3 (*) >90 mL/min   Comment:            The eGFR has been calculated     using the CKD EPI equation.     This calculation has not been     validated in all clinical     situations.     eGFR's persistently     <90 mL/min signify     possible Chronic Kidney Disease.  POCT I-STAT TROPONIN I     Status: None   Collection Time    08/31/12  1:46 PM      Result Value Range   Troponin i, poc 0.00  0.00 - 0.08 ng/mL   Comment 3            Comment: Due to the release kinetics of cTnI,     a negative result within the first hours     of the onset of symptoms does not rule out     myocardial infarction with certainty.     If myocardial infarction is still suspected,     repeat the test at appropriate intervals.  URINALYSIS, ROUTINE W REFLEX MICROSCOPIC     Status: Abnormal   Collection Time    08/31/12  7:48 PM      Result Value Range   Color, Urine YELLOW  YELLOW   APPearance CLOUDY (*) CLEAR   Specific Gravity, Urine 1.011  1.005 - 1.030   pH 6.5  5.0 - 8.0   Glucose, UA NEGATIVE  NEGATIVE mg/dL   Hgb urine dipstick SMALL (*) NEGATIVE   Bilirubin Urine NEGATIVE  NEGATIVE   Ketones, ur NEGATIVE  NEGATIVE mg/dL   Protein, ur 100 (*) NEGATIVE mg/dL   Urobilinogen, UA 0.2  0.0 -  1.0 mg/dL   Nitrite NEGATIVE  NEGATIVE   Leukocytes, UA LARGE (*) NEGATIVE  URINE CULTURE     Status: None   Collection Time    08/31/12  7:48 PM      Result Value Range   Specimen Description URINE, CLEAN CATCH     Special Requests NONE     Culture  Setup Time 09/01/2012 04:04     Colony Count NO GROWTH     Culture NO  GROWTH     Report Status 09/02/2012 FINAL    URINE MICROSCOPIC-ADD ON     Status: Abnormal   Collection Time    08/31/12  7:48 PM      Result Value Range   Squamous Epithelial / LPF FEW (*) RARE   WBC, UA TOO NUMEROUS TO COUNT  <3 WBC/hpf   RBC / HPF 3-6  <3 RBC/hpf   Bacteria, UA FEW (*) RARE  GLUCOSE, CAPILLARY     Status: Abnormal   Collection Time    08/31/12  7:54 PM      Result Value Range   Glucose-Capillary 135 (*) 70 - 99 mg/dL   Comment 1 Notify RN    CBC     Status: Abnormal   Collection Time    09/01/12 12:05 AM      Result Value Range   WBC 7.1  4.0 - 10.5 K/uL   RBC 3.49 (*) 4.22 - 5.81 MIL/uL   Hemoglobin 9.2 (*) 13.0 - 17.0 g/dL   HCT 27.5 (*) 39.0 - 52.0 %   MCV 78.8  78.0 - 100.0 fL   MCH 26.4  26.0 - 34.0 pg   MCHC 33.5  30.0 - 36.0 g/dL   RDW 13.3  11.5 - 15.5 %   Platelets 350  150 - 400 K/uL  BASIC METABOLIC PANEL     Status: Abnormal   Collection Time    09/01/12 12:05 AM      Result Value Range   Sodium 130 (*) 135 - 145 mEq/L   Potassium 5.4 (*) 3.5 - 5.1 mEq/L   Chloride 93 (*) 96 - 112 mEq/L   CO2 17 (*) 19 - 32 mEq/L   Glucose, Bld 135 (*) 70 - 99 mg/dL   BUN 146 (*) 6 - 23 mg/dL   Creatinine, Ser 20.72 (*) 0.50 - 1.35 mg/dL   Calcium 8.9  8.4 - 10.5 mg/dL   GFR calc non Af Amer 2 (*) >90 mL/min   GFR calc Af Amer 3 (*) >90 mL/min   Comment:            The eGFR has been calculated     using the CKD EPI equation.     This calculation has not been     validated in all clinical     situations.     eGFR's persistently     <90 mL/min signify     possible Chronic Kidney Disease.  RENAL FUNCTION PANEL     Status: Abnormal   Collection Time    09/01/12  6:00 AM      Result Value Range   Sodium 130 (*) 135 - 145 mEq/L   Potassium 5.3 (*) 3.5 - 5.1 mEq/L   Chloride 91 (*) 96 - 112 mEq/L   CO2 16 (*) 19 - 32 mEq/L   Glucose, Bld 123 (*) 70 - 99 mg/dL   BUN 145 (*) 6 - 23 mg/dL   Creatinine, Ser 20.91 (*) 0.50 - 1.35 mg/dL  Calcium 8.4   8.4 - 10.5 mg/dL   Phosphorus 8.1 (*) 2.3 - 4.6 mg/dL   Albumin 2.6 (*) 3.5 - 5.2 g/dL   GFR calc non Af Amer 2 (*) >90 mL/min   GFR calc Af Amer 3 (*) >90 mL/min   Comment:            The eGFR has been calculated     using the CKD EPI equation.     This calculation has not been     validated in all clinical     situations.     eGFR's persistently     <90 mL/min signify     possible Chronic Kidney Disease.  SODIUM, URINE, RANDOM     Status: None   Collection Time    09/01/12 10:04 PM      Result Value Range   Sodium, Ur 86    CREATININE, URINE, RANDOM     Status: None   Collection Time    09/01/12 10:04 PM      Result Value Range   Creatinine, Urine 17.61    BASIC METABOLIC PANEL     Status: Abnormal   Collection Time    09/02/12  5:00 AM      Result Value Range   Sodium 129 (*) 135 - 145 mEq/L   Potassium 6.7 (*) 3.5 - 5.1 mEq/L   Comment: DELTA CHECK NOTED     NO VISIBLE HEMOLYSIS     CRITICAL RESULT CALLED TO, READ BACK BY AND VERIFIED WITH:     WICKER K,RN 09/02/12 0618 WAYK   Chloride 90 (*) 96 - 112 mEq/L   CO2 15 (*) 19 - 32 mEq/L   Glucose, Bld 194 (*) 70 - 99 mg/dL   BUN 152 (*) 6 - 23 mg/dL   Creatinine, Ser 21.93 (*) 0.50 - 1.35 mg/dL   Calcium 8.3 (*) 8.4 - 10.5 mg/dL   GFR calc non Af Amer 2 (*) >90 mL/min   GFR calc Af Amer 2 (*) >90 mL/min   Comment:            The eGFR has been calculated     using the CKD EPI equation.     This calculation has not been     validated in all clinical     situations.     eGFR's persistently     <90 mL/min signify     possible Chronic Kidney Disease.   Dg Chest 2 View  08/31/2012  *RADIOLOGY REPORT*  Clinical Data: Loss of consciousness.  History of hypertension.  CHEST - 2 VIEW  Comparison: None.  Findings: Artifact overlies chest.  Heart size is normal. Mediastinal shadows are normal.  The lungs are clear.  Vascularity is normal.  No significant bony finding.  IMPRESSION: No active disease.   Original Report  Authenticated By: [name], M.D.    Ct Head Wo Contrast  08/31/2012  *RADIOLOGY REPORT*  Clinical Data: Syncope.  Fell with head trauma.  CT HEAD WITHOUT CONTRAST  Technique:  Contiguous axial images were obtained from the base of the skull through the vertex without contrast.  Comparison: None.  Findings: The brain has a normal appearance without evidence of malformation, atrophy, old or acute infarction, mass lesion, hemorrhage, hydrocephalus or extra-axial collection.  The calvarium is unremarkable.  Sinuses, middle ears and mastoids are clear.  IMPRESSION: Normal head CT   Original Report Authenticated By: [name], M.D.    US Renal Transplant W/doppler  09/01/2012  *  RADIOLOGY REPORT*  Clinical Data:  History of renal transplant, evaluate for rejection  ULTRASOUND OF RENAL TRANSPLANT  Technique: Ultrasound examination of the renal transplant was performed, with color and duplex Doppler evaluation.  Comparison:  CT abdomen pelvis - 10/31/2005  Findings:  Transplant kidney location:  Left lower abdominal quadrant  Transplant kidney description:  Normal cortical thickness, echogenicity and size, measuring 15.9 cm in length.  Note is made of an approximately 1.9 x 1.0 x 1.5 cm anechoic cyst within the superior pole of the transplanted kidney.  No echogenic renal stones.  There is mild to very moderate pelvocaliectasis within the left lower quadrant transplant kidney.  Color flow in the main renal artery at the hilum:  Present  Color flow in the main renal vein at the hilum:  Present  Resistive indices:       Main renal artery:  0.67       Upper pole segmental renal artery: 0.63       Lower pole segmental renal artery:  0.71  Bladder:  Normal given degree of distension.  IMPRESSION: 1.  Mild to very moderate pelvocaliectasis within the left lower quadrant transplant kidney, likely similar to remote noncontrast abdominal CT performed 10/31/2005.  No definite perinephric stranding. 2.  Normal resistive  indices without definitive evidence of rejection.  3. Approximately 1.9 cm cyst within the superior pole of the transplanted kidney.   Original Report Authenticated By: [name], MD     Assessment/Plan ESRD Needs dialysis. After discussion with pt, he is currently not agreeable to placement of tunneled HD cath today. He states he wishes to give it another day, discuss with his family, and make decision tomorrow. We will check coags and have made him NPO after MN tonight so that procedure can be done tomorrow if he is agreeable. Unsigned consent in front of chart, but procedure explained, including risks, complications. Pt to sign tomorrow if agreeable.  Ascencion Dike PA-C 09/02/2012, 10:18 AM

## 2012-09-02 NOTE — Progress Notes (Signed)
Patient had agreed this morning to placement of PD catheter and HD catheter. He has now refused  HD catheter and surgery is unwilling to place PD catheter without HD.  If he continues to refuse recommendations I will sign off once potassium acceptable.  He can go to [facility] for his second opinion. He has been informed of my plan. Sherronda Sweigert C

## 2012-09-02 NOTE — Care Management Note (Signed)
   CARE MANAGEMENT ED NOTE 09/02/2012  Patient:  Frank [name], Frank [name]   Account Number:  [id number]  Date Initiated:  08/31/2012  Documentation initiated by:  Jackelyn Poling  Subjective/Objective Assessment:   09/01/12 Noted request for assistance with medications for this pt. Will follow for progression and assist with any d/c needs.     Subjective/Objective Assessment Detail:   Following for needs, given current labs if pt should need to start hemodialysis then application for Medicaid could be initiated. This would provide assistance with meds, etc.  09/02/2012 Pt to have tunneled hemodialysis catheter placed today, finance notified of need for Medicaid application due to most likely need for ongoing hemodialysis.  Jasmine Pang RN MPH, [phone]     Action/Plan:   Following for pllan of care.   Action/Plan Detail:   Anticipated DC Date:  09/03/2012     Status Recommendation to Physician:   Result of Recommendation:    Other ED Services  Consult [facility]  Medication Assistance  Other  Outpatient Services - Pt will follow up  PCP issues  [facility]    Choice offered to / List presented to:            Status of service:  Completed, signed off  ED Comments:   ED Comments Detail:  08/31/2012 4:56 PM CM also provided pt with needymeds.org to complete patient assistance programs for cellcept and prograf Cm provided the application for cellcept but prograf requires pt to go onto drug companies site Reviewed these with the pt and placed these written items in a patient belonging bag for the pt He voiced appreciation of services and resources offered  08/31/2012 4:26 PM [facility] ED Cm noted Cm consult for trouble affording medications. Spoke with pt who confirms he is having trouble affording all of his medications not certain ones Pt states he has applied for "medicaid and medicare" but pending "response". "they are suppose to let me know in the next  few weeks".  Cm reviewed the [place] program (assistance one time, each rx co pay of $3 with his choice of pharmacy from a list of participating [facility] pharmacies) Pt voiced understanding. Cm discussed with him that unit cm would check to see which medications he would be d/c home with and provided [facility] letter as needed prior to d/c

## 2012-09-03 LAB — IRON AND TIBC
Saturation Ratios: 81 % — ABNORMAL HIGH (ref 20–55)
TIBC: 104 ug/dL — ABNORMAL LOW (ref 215–435)

## 2012-09-03 LAB — BASIC METABOLIC PANEL
BUN: 153 mg/dL — ABNORMAL HIGH (ref 6–23)
CO2: 16 mEq/L — ABNORMAL LOW (ref 19–32)
Calcium: 7.8 mg/dL — ABNORMAL LOW (ref 8.4–10.5)
Chloride: 92 mEq/L — ABNORMAL LOW (ref 96–112)
Creatinine, Ser: 21.74 mg/dL — ABNORMAL HIGH (ref 0.50–1.35)

## 2012-09-03 MED ORDER — FAMOTIDINE 40 MG PO TABS: 40.0000 mg | ORAL_TABLET | Freq: Every day | ORAL | Status: DC

## 2012-09-03 MED ORDER — FAMOTIDINE 40 MG PO TABS
40.0000 mg | ORAL_TABLET | Freq: Every day | ORAL | Status: DC
  Administered 2012-09-03: 40 mg via ORAL
  Filled 2012-09-03: qty 1

## 2012-09-03 MED ORDER — GI COCKTAIL ~~LOC~~
30.0000 mL | Freq: Once | ORAL | Status: AC
  Administered 2012-09-03: 30 mL via ORAL
  Filled 2012-09-03 (×2): qty 30

## 2012-09-03 NOTE — Progress Notes (Signed)
Spoke with Dr. Dyann Kief who indicated that patient is agreeable to transfer to [place] Bed request line at [phone] to confirm bed availability. Spoke with Sonia Side who stated that patient does not have a bed any longer as the request was cancelled yesterday when he declined to transfer. Stated that the MD will have to begin the entire process over by calling the PAL line at [phone] to get patient accepted again. Also stated that there are currently no bed available. Will notify Dr. Dyann Kief. Frank [name], Frank [name]

## 2012-09-03 NOTE — Progress Notes (Signed)
Pt complained of heartburn mid chest with no chest pain or pressure. Dr Dyann Kief notified. New orders received. [place], Franky Macho

## 2012-09-03 NOTE — Progress Notes (Signed)
Received call from Sonia Side at [facility] who indicated that there was a patient room assignment at [facility]. [facility] transport arranged. RN informed to call report. Glena Pharris, Bryn Gulling

## 2012-09-03 NOTE — Progress Notes (Signed)
Patient ID: Frank [name], male   DOB: 03-09-1966, 47 y.o.   MRN: [id number]    Subjective: Pt states he is going to [facility] today for a second opinion.  Having some pleuritic chest pain  Objective: Vital signs in last 24 hours: Temp:  [97.2 F (36.2 C)-97.6 F (36.4 C)] 97.6 F (36.4 C) (02/13 0511) Pulse Rate:  [77-99] 86 (02/13 0511) Resp:  [17-18] 17 (02/12 2300) BP: (142-164)/(87-101) 164/87 mmHg (02/13 0511) SpO2:  [96 %-99 %] 98 % (02/13 0511) Last BM Date: 08/31/12  Intake/Output from previous day: 02/12 0701 - 02/13 0700 In: -  Out: 650 [Urine:650] Intake/Output this shift: Total I/O In: -  Out: 725 [Urine:725]  General appearance: alert, cooperative and no distress  Lab Results:   Recent Labs  08/31/12 1335 09/01/12 0005  WBC 7.7 7.1  HGB 10.7* 9.2*  HCT 32.1* 27.5*  PLT 419* 350   BMET  Recent Labs  09/02/12 1418 09/03/12 0630  NA 132* 134*  K 5.6* 4.5  CL 91* 92*  CO2 14* 16*  GLUCOSE 155* 123*  BUN 155* 153*  CREATININE 21.97* 21.74*  CALCIUM 8.6 7.8*     Studies/Results: US Renal Transplant W/doppler  09/01/2012  *RADIOLOGY REPORT*  Clinical Data:  History of renal transplant, evaluate for rejection  ULTRASOUND OF RENAL TRANSPLANT  Technique: Ultrasound examination of the renal transplant was performed, with color and duplex Doppler evaluation.  Comparison:  CT abdomen pelvis - 10/31/2005  Findings:  Transplant kidney location:  Left lower abdominal quadrant  Transplant kidney description:  Normal cortical thickness, echogenicity and size, measuring 15.9 cm in length.  Note is made of an approximately 1.9 x 1.0 x 1.5 cm anechoic cyst within the superior pole of the transplanted kidney.  No echogenic renal stones.  There is mild to very moderate pelvocaliectasis within the left lower quadrant transplant kidney.  Color flow in the main renal artery at the hilum:  Present  Color flow in the main renal vein at the hilum:  Present  Resistive indices:        Main renal artery:  0.67       Upper pole segmental renal artery: 0.63       Lower pole segmental renal artery:  0.71  Bladder:  Normal given degree of distension.  IMPRESSION: 1.  Mild to very moderate pelvocaliectasis within the left lower quadrant transplant kidney, likely similar to remote noncontrast abdominal CT performed 10/31/2005.  No definite perinephric stranding. 2.  Normal resistive indices without definitive evidence of rejection.  3. Approximately 1.9 cm cyst within the superior pole of the transplanted kidney.   Original Report Authenticated By: [name], MD     Anti-infectives: Anti-infectives   None      Assessment/Plan: ESRD Hyperkalemia improved I told him we would be available to consider PD cath if he remains here but his plan is to transfer to [facility]    LOS: 3 days    Romario Tith T 09/03/2012

## 2012-09-03 NOTE — Consult Note (Addendum)
Discussed with Ms. Osborne.  Given patient's numerous comorbidities, would be best to do this when patient optimized to minimize his risks.  If anesthesia and nephrology feel like it is not safe for him to go under general anesthesia until gets hemodialysis, I will not operate on this patient.  I think it is unrealistic to expect he can completely avoid hemodialysis.  Patient apparently adamant that he will not have any hemodialysis & wants second opinion at [name].  That is his option.  We will try and see if we can be available to help place peritoneal dialysis catheter if he changes his mind .  If nephrology & anesthesia clear him, we will try to do surgery this admission.  I did caution that he will not be able to use it until the catheter cuffs are well encapsulated/sealed = 2 weeks on average  We will try and discuss with him in person on the 13th.  Gilford Raid completely booked for the next few days.  Other partner out of town.  Other partner completely booked.  We will try to adjust schedule.  Ideally, patient would start hemodialysis so we can coordinate placement of the peritoneal dialysis catheter in a safer time later.  I suspect [facility] and other institutions would agree with this recommendation, but he has a right to a second opinion.  Adin Hector, M.D., F.A.C.S. Gastrointestinal and Minimally Invasive Surgery [facility] [address], [place], [place] [address] [phone] Main / Paging [phone] Voice Mail

## 2012-09-03 NOTE — Progress Notes (Signed)
I spoke with Mr. Lenderman this AM and asked him what his plan was.  He said "they are going to take me to [facility]".  I wished him the best and he said "same to you."  Potassium down to 5.6 yesterday.  The hospitalist team is managing patient. We will sign off. Please re-consult as needed. Cherica Heiden C

## 2012-09-03 NOTE — Progress Notes (Signed)
Report called to Pablo Lawrence, RN at [facility]. Pt prepared for discharge. Pt remains stable. IV left in place in Right upper arm (22 gauge). Tele removed with [facility] arrived. [facility] here to transport pt to [place], [place]

## 2012-09-04 LAB — PARATHYROID HORMONE, INTACT (NO CA): PTH: 1210.4 pg/mL — ABNORMAL HIGH (ref 14.0–72.0)

## 2012-09-04 MED FILL — Fentanyl Citrate Inj 0.05 MG/ML: INTRAMUSCULAR | Qty: 2 | Status: AC

## 2012-11-28 ENCOUNTER — Encounter (HOSPITAL_COMMUNITY): Payer: Self-pay | Admitting: *Deleted

## 2012-11-28 ENCOUNTER — Emergency Department (HOSPITAL_COMMUNITY)
Admission: EM | Admit: 2012-11-28 | Discharge: 2012-11-28 | Disposition: A | Discharge status: Home / Self Care | Payer: Medicaid Other | Attending: Emergency Medicine | Admitting: Emergency Medicine

## 2012-11-28 ENCOUNTER — Emergency Department (HOSPITAL_COMMUNITY): Payer: Medicaid Other

## 2012-11-28 DIAGNOSIS — S46909A Unspecified injury of unspecified muscle, fascia and tendon at shoulder and upper arm level, unspecified arm, initial encounter: Secondary | ICD-10-CM | POA: Insufficient documentation

## 2012-11-28 DIAGNOSIS — I10 Essential (primary) hypertension: Secondary | ICD-10-CM | POA: Insufficient documentation

## 2012-11-28 DIAGNOSIS — F329 Major depressive disorder, single episode, unspecified: Secondary | ICD-10-CM | POA: Insufficient documentation

## 2012-11-28 DIAGNOSIS — Y929 Unspecified place or not applicable: Secondary | ICD-10-CM | POA: Insufficient documentation

## 2012-11-28 DIAGNOSIS — M25512 Pain in left shoulder: Secondary | ICD-10-CM

## 2012-11-28 DIAGNOSIS — Y939 Activity, unspecified: Secondary | ICD-10-CM | POA: Insufficient documentation

## 2012-11-28 DIAGNOSIS — X58XXXA Exposure to other specified factors, initial encounter: Secondary | ICD-10-CM | POA: Insufficient documentation

## 2012-11-28 DIAGNOSIS — S4980XA Other specified injuries of shoulder and upper arm, unspecified arm, initial encounter: Secondary | ICD-10-CM | POA: Insufficient documentation

## 2012-11-28 DIAGNOSIS — F3289 Other specified depressive episodes: Secondary | ICD-10-CM | POA: Insufficient documentation

## 2012-11-28 DIAGNOSIS — Z79899 Other long term (current) drug therapy: Secondary | ICD-10-CM | POA: Insufficient documentation

## 2012-11-28 DIAGNOSIS — Z87448 Personal history of other diseases of urinary system: Secondary | ICD-10-CM | POA: Insufficient documentation

## 2012-11-28 MED ORDER — IBUPROFEN 800 MG PO TABS
800.0000 mg | ORAL_TABLET | Freq: Once | ORAL | Status: AC
  Administered 2012-11-28: 800 mg via ORAL
  Filled 2012-11-28: qty 1

## 2012-11-28 MED ORDER — HYDROCODONE-ACETAMINOPHEN 5-325 MG PO TABS: 1.0000 | ORAL_TABLET | Freq: Four times a day (QID) | ORAL | Status: DC | PRN

## 2012-11-28 NOTE — ED Provider Notes (Signed)
History     CSN: [id number]  Arrival date & time 11/28/12  1009   First MD Initiated Contact with Patient 11/28/12 1057      Chief Complaint  Patient presents with  . Shoulder Pain    left    (Consider location/radiation/quality/duration/timing/severity/associated sxs/prior treatment) HPI   Frank [name] is a 47 y.o. male who developed left shoulder pain yesterday morning when he woke up. Mechanism of injury: none. Immediate symptoms: pain and stiffness. Symptoms have been constant since that time, rates pain at a 8/10. No prior history of injury to left shoulder. Denies alleviating factors. Raising arm above head aggravates shoulder. Denies numbness and tingling down arm or decreased strength. Patient denies fevers, chills, nausea, vomiting, or diarrhea.     Past Medical History  Diagnosis Date  . Hypertension   . Renal failure   . Depression   . Complication of anesthesia     itching, sore throat    Past Surgical History  Procedure Laterality Date  . Kidney receipient    . Nephrectomy transplanted organ    . Capd insertion    . Capd removal      No family history on file.  History  Substance Use Topics  . Smoking status: Never Smoker   . Smokeless tobacco: Never Used  . Alcohol Use: No      Review of Systems  Musculoskeletal:       Shoulder pain  All other systems reviewed and are negative.    Allergies  Darvocet  Home Medications   Current Outpatient Rx  Name  Route  Sig  Dispense  Refill  . amLODipine (NORVASC) 5 MG tablet   Oral   Take 2 tablets (10 mg total) by mouth daily.         . darbepoetin (ARANESP) 150 MCG/0.3ML SOLN   Intravenous   Inject 0.3 mLs (150 mcg total) into the vein every Wednesday with hemodialysis.   1.68 mL      . famotidine (PEPCID) 40 MG tablet   Oral   Take 1 tablet (40 mg total) by mouth daily.         [name] HYDROcodone-acetaminophen (NORCO/VICODIN) 5-325 MG per tablet   Oral   Take 1-2 tablets by mouth  every 6 (six) hours as needed for pain.   10 tablet   0   . labetalol (NORMODYNE) 200 MG tablet   Oral   Take 200 mg by mouth 2 (two) times daily.         . mycophenolate (CELLCEPT) 500 MG tablet   Oral   Take 500 mg by mouth 2 (two) times daily.         . sodium bicarbonate 650 MG tablet   Oral   Take 1 tablet (650 mg total) by mouth 3 (three) times daily.         . sodium chloride 0.9 % infusion   Intravenous   Inject 75 mLs into the vein continuous.   1000 mL   0   . tacrolimus (PROGRAF) 1 MG capsule   Oral   Take 3 mg by mouth 2 (two) times daily.         . tacrolimus (PROGRAF) 5 MG capsule   Oral   Take 5 mg by mouth 2 (two) times daily.           BP 138/100  Pulse 72  Temp(Src) 98.1 F (36.7 C) (Oral)  Resp 18  Ht 6' (1.829 m)  Wt 187  lb (84.823 kg)  BMI 25.36 kg/m2  SpO2 96%  Physical Exam  Constitutional: He is oriented to person, place, and time. He appears well-developed and well-nourished.  HENT:  Head: Normocephalic and atraumatic.  Eyes: EOM are normal. Pupils are equal, round, and reactive to light.  Cardiovascular: Normal rate, regular rhythm and normal heart sounds.   Pulmonary/Chest: Effort normal and breath sounds normal.  Abdominal: Soft. Bowel sounds are normal.  Musculoskeletal:       Right shoulder: Normal.       Left shoulder: He exhibits tenderness. He exhibits no bony tenderness, no swelling, no effusion, no crepitus, no deformity, no spasm, normal pulse and normal strength.  Apley scratch test limited d/t pain.  Apprehension test negative.  Empty can test negative.  No neurovascular deficits.   Neurological: He is alert and oriented to person, place, and time.  Skin: Skin is warm and dry.  Psychiatric: He has a normal mood and affect.    ED Course  Procedures (including critical care time)  Labs Reviewed - No data to display Dg Shoulder Left  11/28/2012  *RADIOLOGY REPORT*  Clinical Data: Left shoulder injury with  pain.  LEFT SHOULDER - 2+ VIEW  Comparison: None.  Findings: No acute fracture or dislocation is identified.  No significant degenerative changes.  No bony lesions are identified. Soft tissues are unremarkable.  IMPRESSION: Normal left shoulder.   Original Report Authenticated By: Aletta Edouard, M.D.      1. Shoulder pain, acute, left       MDM  PE shows no instability, tenderness, or deformity of acromioclavicular and sternoclavicular joints, the cervical spine, glenohumeral joint, coracoid process, acromion, or scapula. Good shoulder strength during empty can test. Limited ROM d/t pain during scratch test. No signs of impingement on Neers test. No shoulder instability during Apprehension test. RICE protcol discussed. Patient agreeable to plan. Patient is stable at time of discharge             Harlow Mares, PA-C 11/28/12 1633

## 2012-11-28 NOTE — ED Notes (Signed)
Pt states Friday morning when he woke up, his left shoulder hurt.  He is unable to lay on his left side without a great deal of pain.  Pt can touch his left hand to right shoulder, but it is painful.  No deformity is noted.

## 2012-11-29 NOTE — ED Provider Notes (Signed)
Medical screening examination/treatment/procedure(s) were performed by non-physician practitioner and as supervising physician I was immediately available for consultation/collaboration.  Veryl Speak, MD 11/29/12 [phone]

## 2012-12-24 ENCOUNTER — Encounter (HOSPITAL_COMMUNITY): Payer: Self-pay | Admitting: Emergency Medicine

## 2012-12-24 ENCOUNTER — Emergency Department (HOSPITAL_COMMUNITY)
Admission: EM | Admit: 2012-12-24 | Discharge: 2012-12-24 | Disposition: A | Discharge status: Home / Self Care | Payer: Medicaid Other | Attending: Emergency Medicine | Admitting: Emergency Medicine

## 2012-12-24 DIAGNOSIS — Z79899 Other long term (current) drug therapy: Secondary | ICD-10-CM | POA: Insufficient documentation

## 2012-12-24 DIAGNOSIS — I10 Essential (primary) hypertension: Secondary | ICD-10-CM | POA: Insufficient documentation

## 2012-12-24 DIAGNOSIS — H9193 Unspecified hearing loss, bilateral: Secondary | ICD-10-CM

## 2012-12-24 DIAGNOSIS — H9190 Unspecified hearing loss, unspecified ear: Secondary | ICD-10-CM | POA: Insufficient documentation

## 2012-12-24 DIAGNOSIS — Z940 Kidney transplant status: Secondary | ICD-10-CM | POA: Insufficient documentation

## 2012-12-24 DIAGNOSIS — H9319 Tinnitus, unspecified ear: Secondary | ICD-10-CM | POA: Insufficient documentation

## 2012-12-24 DIAGNOSIS — Z8659 Personal history of other mental and behavioral disorders: Secondary | ICD-10-CM | POA: Insufficient documentation

## 2012-12-24 DIAGNOSIS — Z87448 Personal history of other diseases of urinary system: Secondary | ICD-10-CM | POA: Insufficient documentation

## 2012-12-24 DIAGNOSIS — IMO0002 Reserved for concepts with insufficient information to code with codable children: Secondary | ICD-10-CM | POA: Insufficient documentation

## 2012-12-24 NOTE — ED Notes (Signed)
Pt states he is a dialysis pt and his last treatment was Tuesday

## 2012-12-24 NOTE — ED Provider Notes (Signed)
History    47 year old male with progressive hearing loss. Onset about 3 weeks ago. Patient has a history of renal failure. No recent medication changes aside from recently on hydrocodone which she only took her. 2-3 days. Noted in his. No ear pain. No ear drainage. No fevers or chills. No headaches. Denies trauma. No history similar symptoms. No dizziness, lightheadedness or vertiginous symptoms. Patient states she has an ENT appointment later this month for evaluation of the same.   CSN: [id number]  Arrival date & time 12/24/12  0106   First MD Initiated Contact with Patient 12/24/12 [phone]      Chief Complaint  Patient presents with  . Hearing Problem    (Consider location/radiation/quality/duration/timing/severity/associated sxs/prior treatment) HPI  Past Medical History  Diagnosis Date  . Hypertension   . Renal failure   . Depression   . Complication of anesthesia     itching, sore throat    Past Surgical History  Procedure Laterality Date  . Kidney receipient    . Nephrectomy transplanted organ    . Capd insertion    . Capd removal      History reviewed. No pertinent family history.  History  Substance Use Topics  . Smoking status: Never Smoker   . Smokeless tobacco: Never Used  . Alcohol Use: No      Review of Systems  All systems reviewed and negative, other than as noted in HPI.   Allergies  Darvocet  Home Medications   Current Outpatient Rx  Name  Route  Sig  Dispense  Refill  . acetaminophen (TYLENOL) 500 MG tablet   Oral   Take 1,000 mg by mouth every 6 (six) hours as needed for pain.         [name] amLODipine (NORVASC) 5 MG tablet   Oral   Take 2 tablets (10 mg total) by mouth daily.         [name] HYDROcodone-acetaminophen (NORCO/VICODIN) 5-325 MG per tablet   Oral   Take 1-2 tablets by mouth every 6 (six) hours as needed for pain.   10 tablet   0   . labetalol (NORMODYNE) 200 MG tablet   Oral   Take 200 mg by mouth 3 (three) times daily.           . mycophenolate (CELLCEPT) 500 MG tablet   Oral   Take 500 mg by mouth 2 (two) times daily.         [name] omeprazole (PRILOSEC) 20 MG capsule   Oral   Take 20 mg by mouth daily.         . predniSONE (DELTASONE) 10 MG tablet   Oral   Take 20 mg by mouth daily.         [name] sulfamethoxazole-trimethoprim (BACTRIM,SEPTRA) 400-80 MG per tablet   Oral   Take 1 tablet by mouth every Monday, Wednesday, and Friday.         . tacrolimus (PROGRAF) 5 MG capsule   Oral   Take 10 mg by mouth 2 (two) times daily.          . valGANciclovir (VALCYTE) 450 MG tablet   Oral   Take 450 mg by mouth every Monday, Wednesday, and Friday.           BP 161/100  Pulse 89  Temp(Src) 98.4 F (36.9 C) (Oral)  Resp 18  Ht _0  (1.854 m)  Wt 182 lb (82.555 kg)  BMI 24.02 kg/m2  SpO2 100%  Physical Exam  Nursing  note and vitals reviewed. Constitutional: He appears well-developed and well-nourished. No distress.  Laying in bed. NAD. HoH.   HENT:  Head: Normocephalic and atraumatic.  External auditory canals and tympanic membranes are clear bilaterally.  Eyes: Conjunctivae are normal. Right eye exhibits no discharge. Left eye exhibits no discharge.  Neck: Neck supple.  Cardiovascular: Normal rate, regular rhythm and normal heart sounds.  Exam reveals no gallop and no friction rub.   No murmur heard. Pulmonary/Chest: Effort normal and breath sounds normal. No respiratory distress.  Abdominal: Soft. He exhibits no distension. There is no tenderness.  Musculoskeletal: He exhibits no edema and no tenderness.  Neurological: He is alert.  Skin: Skin is warm and dry.  Psychiatric: He has a normal mood and affect. His behavior is normal. Thought content normal.    ED Course  Procedures (including critical care time)  Labs Reviewed - No data to display No results found.   1. Hearing loss, bilateral       MDM  47 year old male with bilateral hearing loss associated with  tinnitus. Etiology is not completely clear. No obvious offending medications per review of patient's medication list. He was recently taking Vicodin but this was for short period of time and is not currently taking it. Known association of hearing loss with renal failure. I'm not sure if this is the case in this instance. Somewhat young and progression fairly acute making presbycusis less likely. HEENT exam is unremarkable. I'm not sure what additionally I can provide today in the emergency setting. I feel pt is safe for discharge at this time. Pt has ENT appointment for 6/25. Encouraged to try to move this up if he can.         Virgel Manifold, MD 12/28/12 1444

## 2012-12-24 NOTE — ED Notes (Signed)
Pt states for the past few weeks he has been having difficulty hearing  Pt states it is progressively getting worse  Pt states he was seen by his pcp today and was told to come here because he could not get in to the ENT until the 25th

## 2013-04-09 ENCOUNTER — Other Ambulatory Visit: Payer: Self-pay

## 2013-04-09 ENCOUNTER — Emergency Department (HOSPITAL_COMMUNITY): Payer: Medicare Other

## 2013-04-09 ENCOUNTER — Emergency Department (HOSPITAL_COMMUNITY)
Admission: EM | Admit: 2013-04-09 | Discharge: 2013-04-10 | Disposition: A | Discharge status: Home / Self Care | Payer: Medicare Other | Attending: Emergency Medicine | Admitting: Emergency Medicine

## 2013-04-09 ENCOUNTER — Encounter (HOSPITAL_COMMUNITY): Payer: Self-pay | Admitting: Emergency Medicine

## 2013-04-09 DIAGNOSIS — IMO0002 Reserved for concepts with insufficient information to code with codable children: Secondary | ICD-10-CM | POA: Insufficient documentation

## 2013-04-09 DIAGNOSIS — I10 Essential (primary) hypertension: Secondary | ICD-10-CM

## 2013-04-09 DIAGNOSIS — N186 End stage renal disease: Secondary | ICD-10-CM

## 2013-04-09 DIAGNOSIS — Z992 Dependence on renal dialysis: Secondary | ICD-10-CM | POA: Insufficient documentation

## 2013-04-09 DIAGNOSIS — M79609 Pain in unspecified limb: Secondary | ICD-10-CM | POA: Insufficient documentation

## 2013-04-09 DIAGNOSIS — R9431 Abnormal electrocardiogram [ECG] [EKG]: Secondary | ICD-10-CM

## 2013-04-09 DIAGNOSIS — Z8659 Personal history of other mental and behavioral disorders: Secondary | ICD-10-CM | POA: Insufficient documentation

## 2013-04-09 DIAGNOSIS — Z87448 Personal history of other diseases of urinary system: Secondary | ICD-10-CM | POA: Insufficient documentation

## 2013-04-09 DIAGNOSIS — I120 Hypertensive chronic kidney disease with stage 5 chronic kidney disease or end stage renal disease: Secondary | ICD-10-CM | POA: Insufficient documentation

## 2013-04-09 DIAGNOSIS — M79675 Pain in left toe(s): Secondary | ICD-10-CM

## 2013-04-09 DIAGNOSIS — Z79899 Other long term (current) drug therapy: Secondary | ICD-10-CM | POA: Insufficient documentation

## 2013-04-09 LAB — CBC WITH DIFFERENTIAL/PLATELET
Eosinophils Absolute: 0.1 10*3/uL (ref 0.0–0.7)
Hemoglobin: 10 g/dL — ABNORMAL LOW (ref 13.0–17.0)
Lymphocytes Relative: 26 % (ref 12–46)
Lymphs Abs: 1.9 10*3/uL (ref 0.7–4.0)
MCH: 25.9 pg — ABNORMAL LOW (ref 26.0–34.0)
Monocytes Relative: 8 % (ref 3–12)
Neutro Abs: 4.7 10*3/uL (ref 1.7–7.7)
Neutrophils Relative %: 64 % (ref 43–77)
Platelets: 126 10*3/uL — ABNORMAL LOW (ref 150–400)
RBC: 3.86 MIL/uL — ABNORMAL LOW (ref 4.22–5.81)
WBC: 7.3 10*3/uL (ref 4.0–10.5)

## 2013-04-09 LAB — POCT I-STAT, CHEM 8
Chloride: 106 mEq/L (ref 96–112)
Creatinine, Ser: 8.2 mg/dL — ABNORMAL HIGH (ref 0.50–1.35)
Glucose, Bld: 94 mg/dL (ref 70–99)
HCT: 34 % — ABNORMAL LOW (ref 39.0–52.0)
Hemoglobin: 11.6 g/dL — ABNORMAL LOW (ref 13.0–17.0)
Potassium: 3.7 mEq/L (ref 3.5–5.1)
Sodium: 142 mEq/L (ref 135–145)

## 2013-04-09 MED ORDER — OXYCODONE-ACETAMINOPHEN 5-325 MG PO TABS: 2.0000 | ORAL_TABLET | ORAL | Status: DC | PRN

## 2013-04-09 MED ORDER — OXYCODONE-ACETAMINOPHEN 5-325 MG PO TABS
2.0000 | ORAL_TABLET | Freq: Once | ORAL | Status: AC
  Administered 2013-04-09: 2 via ORAL
  Filled 2013-04-09: qty 2

## 2013-04-09 MED ORDER — CLONIDINE HCL 0.1 MG PO TABS
0.1000 mg | ORAL_TABLET | Freq: Once | ORAL | Status: AC
  Administered 2013-04-09: 0.1 mg via ORAL
  Filled 2013-04-09: qty 1

## 2013-04-09 MED ORDER — HYDROCODONE-ACETAMINOPHEN 5-325 MG PO TABS
2.0000 | ORAL_TABLET | Freq: Once | ORAL | Status: AC
  Administered 2013-04-09: 2 via ORAL
  Filled 2013-04-09: qty 2

## 2013-04-09 NOTE — ED Notes (Signed)
Per pt, he was in dialysis yesterday and his BP was high (pt does not remember exact reading). Dialysis recommended he come to the ED to be seen. Pt waited until tonight to come in. States he took it at home and it was 190/119. Denies any HA, blurred vision or other s/s. Pt also states he has toe pain that started two days ago.

## 2013-04-09 NOTE — ED Provider Notes (Signed)
CSN: [id number]     Arrival date & time 04/09/13  1916 History   First MD Initiated Contact with Patient 04/09/13 1947     Chief Complaint  Patient presents with  . Hypertension   (Consider location/radiation/quality/duration/timing/severity/associated sxs/prior Treatment) HPI Comments: 47 yo male with htn hx, on labetalol, norvasc, clonidine, pt has no recent missed doses or changes.  He presents with elevated bp for 3 days ranging 470 to 962 systolic, higher than usual.  Pt on dialysis for esrd.  Pt feels fine other than toe pain and htn readings.  Patient is a 47 y.o. male presenting with hypertension. The history is provided by the patient.  Hypertension This is a new problem. Pertinent negatives include no chest pain, no abdominal pain, no headaches and no shortness of breath.    Past Medical History  Diagnosis Date  . Hypertension   . Renal failure   . Depression   . Complication of anesthesia     itching, sore throat   Past Surgical History  Procedure Laterality Date  . Kidney receipient    . Nephrectomy transplanted organ    . Capd insertion    . Capd removal     No family history on file. History  Substance Use Topics  . Smoking status: Never Smoker   . Smokeless tobacco: Never Used  . Alcohol Use: No    Review of Systems  Constitutional: Negative for fever and chills.  HENT: Negative for neck pain and neck stiffness.   Eyes: Negative for visual disturbance.  Respiratory: Negative for shortness of breath.   Cardiovascular: Negative for chest pain.  Gastrointestinal: Negative for vomiting and abdominal pain.  Genitourinary: Negative for dysuria and flank pain.  Musculoskeletal: Positive for arthralgias. Negative for back pain.  Skin: Negative for rash.  Neurological: Negative for light-headedness and headaches.    Allergies  Darvocet  Home Medications   Current Outpatient Rx  Name  Route  Sig  Dispense  Refill  . acetaminophen (TYLENOL) 500 MG  tablet   Oral   Take 1,000 mg by mouth every 6 (six) hours as needed for pain.         [name] amLODipine (NORVASC) 5 MG tablet   Oral   Take 2 tablets (10 mg total) by mouth daily.         [name] labetalol (NORMODYNE) 200 MG tablet   Oral   Take 200 mg by mouth 3 (three) times daily.          . mycophenolate (CELLCEPT) 500 MG tablet   Oral   Take 500 mg by mouth 2 (two) times daily.         [name] oxyCODONE-acetaminophen (PERCOCET) 7.5-325 MG per tablet   Oral   Take 1 tablet by mouth every 4 (four) hours as needed for pain.         . predniSONE (DELTASONE) 10 MG tablet   Oral   Take 20 mg by mouth daily.         . tacrolimus (PROGRAF) 5 MG capsule   Oral   Take 10 mg by mouth 2 (two) times daily.          . valGANciclovir (VALCYTE) 450 MG tablet   Oral   Take 450 mg by mouth every Monday, Wednesday, and Friday.          BP 171/112  Pulse 72  Temp(Src) 98.1 F (36.7 C) (Oral)  Resp 16  SpO2 100% Physical Exam  Nursing note and vitals  reviewed. Constitutional: He is oriented to person, place, and time. He appears well-developed and well-nourished.  HENT:  Head: Normocephalic and atraumatic.  Eyes: Conjunctivae are normal. Right eye exhibits no discharge. Left eye exhibits no discharge.  Neck: Normal range of motion. Neck supple. No tracheal deviation present.  Cardiovascular: Normal rate, regular rhythm and intact distal pulses.   No murmur heard. Pulmonary/Chest: Effort normal and breath sounds normal.  Abdominal: Soft. He exhibits no distension. There is no tenderness. There is no guarding.  Musculoskeletal: He exhibits tenderness (left great toe, no erythema/ warmth/ wound or deformity, tender to palpation). He exhibits no edema.  Neurological: He is alert and oriented to person, place, and time.  Skin: Skin is warm. No rash noted.  Psychiatric: He has a normal mood and affect.    ED Course  Procedures (including critical care time) Labs Review Labs  Reviewed  CBC WITH DIFFERENTIAL - Abnormal; Notable for the following:    RBC 3.86 (*)    Hemoglobin 10.0 (*)    HCT 32.4 (*)    MCH 25.9 (*)    RDW 17.6 (*)    Platelets 126 (*)    All other components within normal limits  POCT I-STAT, CHEM 8 - Abnormal; Notable for the following:    BUN 36 (*)    Creatinine, Ser 8.20 (*)    Calcium, Ion 1.08 (*)    Hemoglobin 11.6 (*)    HCT 34.0 (*)    All other components within normal limits   Imaging Review No results found.  MDM  No diagnosis found.  No known gout hx, no concern for septic joint. Pain meds given, recheck mild improvement, repeat dose. Clonidine given.  BP improved in ED.  EKG had new T wave inversions, pt has no cp or sob, unsure when changes occurred.  Date: 04/10/2013  Rate: 72  Rhythm: normal sinus rhythm  QRS Axis: indeterminate  Intervals: normal  ST/T Wave abnormalities: nonspecific T wave changes  Conduction Disutrbances:none  Narrative Interpretation:   Old EKG Reviewed: changes noted lateral t wave inversions  Patient has capacity to make decisions, understands benefits of hospitalization and risks of going home may result in worsening health condition and heart issues  Patient refuses hospital placement. Patient understands they may return at any time.    BP likely elevated from known htn and pain.  Pt will fup closely with pcp.  EKG changes, htn, toe pain.    Mariea Clonts, MD 04/10/13 0111

## 2013-04-13 ENCOUNTER — Encounter: Payer: Self-pay | Admitting: Internal Medicine

## 2013-04-13 ENCOUNTER — Ambulatory Visit: Payer: Medicare Other | Attending: Internal Medicine | Admitting: Internal Medicine

## 2013-04-13 VITALS — BP 213/129 | HR 88 | Temp 97.8°F | Resp 15 | Wt 170.8 lb

## 2013-04-13 DIAGNOSIS — N186 End stage renal disease: Secondary | ICD-10-CM

## 2013-04-13 DIAGNOSIS — N179 Acute kidney failure, unspecified: Secondary | ICD-10-CM

## 2013-04-13 DIAGNOSIS — I10 Essential (primary) hypertension: Secondary | ICD-10-CM

## 2013-04-13 DIAGNOSIS — F329 Major depressive disorder, single episode, unspecified: Secondary | ICD-10-CM

## 2013-04-13 DIAGNOSIS — E1110 Type 2 diabetes mellitus with ketoacidosis without coma: Secondary | ICD-10-CM

## 2013-04-13 LAB — URIC ACID: Uric Acid, Serum: 9 mg/dL — ABNORMAL HIGH (ref 4.0–7.8)

## 2013-04-13 LAB — GLUCOSE, POCT (MANUAL RESULT ENTRY): POC Glucose: 96 mg/dl (ref 70–99)

## 2013-04-13 LAB — POCT GLYCOSYLATED HEMOGLOBIN (HGB A1C): Hemoglobin A1C: 5.5

## 2013-04-13 MED ORDER — COLCHICINE 0.6 MG PO TABS: 0.6000 mg | ORAL_TABLET | Freq: Every day | ORAL | Status: DC

## 2013-04-13 MED ORDER — PREDNISONE 10 MG PO TABS: ORAL_TABLET | ORAL | Status: DC

## 2013-04-13 MED ORDER — TRAMADOL HCL 50 MG PO TABS: 50.0000 mg | ORAL_TABLET | Freq: Three times a day (TID) | ORAL | Status: DC | PRN

## 2013-04-13 MED ORDER — CLONIDINE HCL 0.1 MG PO TABS: 0.2000 mg | ORAL_TABLET | Freq: Once | ORAL | Status: DC

## 2013-04-13 MED ORDER — MELOXICAM 7.5 MG PO TABS: 7.5000 mg | ORAL_TABLET | Freq: Every day | ORAL | Status: DC

## 2013-04-13 NOTE — Progress Notes (Unsigned)
Patient states here for left foot pain Has a history of gout and is a diabetic Takes insulin but not sure of the name

## 2013-04-13 NOTE — Progress Notes (Unsigned)
Patient ID: JONNY DEARDEN, male   DOB: 01-25-1966, 47 y.o.   MRN: [id number] Patient Demographics  Darey Hershberger, is a 47 y.o. male  HQI:[id number]  XLK:[id number]  DOB - 01-11-1966  Chief Complaint  Patient presents with  . Foot Pain        Subjective:   Bernon Arviso today is here to establish primary care. The patient is a 47 year old male with history of hypertension, end-stage renal disease on hemodialysis Tuesday Thursday Saturday, diabetes, who follows the nephrology in [facility], (? [name]), states that he was in the ER with the left toe pain and was sent to the clinic to establish care. Patient reports that he needs pain medications and oxycodone is not helping.  Patient has No headache, No chest pain, No abdominal pain - No Nausea, No new weakness tingling or numbness, No Cough - SOB.   Objective:    Filed Vitals:   04/13/13 1428  BP: 213/129  Pulse: 88  Temp: 97.8 F (36.6 C)  Resp: 15  Weight: 170 lb 12.8 oz (77.474 kg)  SpO2: 100%     ALLERGIES:   Allergies  Allergen Reactions  . Darvocet [Propoxyphene-Acetaminophen] Hives    PAST MEDICAL HISTORY: Past Medical History  Diagnosis Date  . Hypertension   . Renal failure   . Depression   . Complication of anesthesia     itching, sore throat  . Diabetes mellitus without complication     PAST SURGICAL HISTORY: Past Surgical History  Procedure Laterality Date  . Kidney receipient    . Nephrectomy transplanted organ    . Capd insertion    . Capd removal      FAMILY HISTORY: History reviewed. No pertinent family history.  MEDICATIONS AT HOME: Prior to Admission medications   Medication Sig Start Date End Date Taking? Authorizing Provider  acetaminophen (TYLENOL) 500 MG tablet Take 1,000 mg by mouth every 6 (six) hours as needed for pain.    Historical Provider, MD  amLODipine (NORVASC) 5 MG tablet Take 2 tablets (10 mg total) by mouth daily. 09/02/12   Barton Dubois, MD  labetalol (NORMODYNE) 200 MG  tablet Take 200 mg by mouth 3 (three) times daily.     Historical Provider, MD  meloxicam (MOBIC) 7.5 MG tablet Take 1 tablet (7.5 mg total) by mouth daily. 04/13/13   Dragan Tamburrino Krystal Eaton, MD  mycophenolate (CELLCEPT) 500 MG tablet Take 500 mg by mouth 2 (two) times daily.    Historical Provider, MD  oxyCODONE-acetaminophen (PERCOCET) 5-325 MG per tablet Take 2 tablets by mouth every 4 (four) hours as needed for pain. 04/09/13   Mariea Clonts, MD  oxyCODONE-acetaminophen (PERCOCET) 7.5-325 MG per tablet Take 1 tablet by mouth every 4 (four) hours as needed for pain.    Historical Provider, MD  predniSONE (DELTASONE) 10 MG tablet Take 20 mg by mouth daily.    Historical Provider, MD  predniSONE (DELTASONE) 10 MG tablet Prednisone dosing: Take  Prednisone 70m (4 tabs) x 2 days, then taper to 360m(3 tabs) x 2 days, then 2032m2 tabs) x 2days, then 37m70m tab) x 2days, then OFF. 04/13/13   Dellie Piasecki K Danaya Geddis, MD  tacrolimus (PROGRAF) 5 MG capsule Take 10 mg by mouth 2 (two) times daily.     Historical Provider, MD  traMADol (ULTRAM) 50 MG tablet Take 1 tablet (50 mg total) by mouth every 8 (eight) hours as needed for pain. 04/13/13   Leng Montesdeoca K RaKrystal Eaton  valGANciclovir (VALGillermina Hu  450 MG tablet Take 450 mg by mouth every Monday, Wednesday, and Friday.    Historical Provider, MD    REVIEW OF SYSTEMS:  Constitutional:   No   Fevers, chills, fatigue.  HEENT:    No headaches, Sore throat,   Cardio-vascular: No chest pain,  Orthopnea, swelling in lower extremities, anasarca, palpitations  GI:  No abdominal pain, nausea, vomiting, diarrhea  Resp: No shortness of breath,  No coughing up of blood.No cough.No wheezing.  Skin:  no rash or lesions.  GU:  no dysuria, change in color of urine, no urgency or frequency.  No flank pain.  Musculoskeletal: See history of present illness  Psych: No change in mood or affect. No depression or anxiety.  No memory loss.   Exam  General appearance :Awake, alert,  NAD, Speech Clear. HEENT: Atraumatic and Normocephalic, PERLA Neck: supple, no JVD. No cervical lymphadenopathy.  Chest: clear to auscultation bilaterally, no wheezing, rales or rhonchi CVS: S1 S2 regular, no murmurs.  Abdomen: soft, NBS, NT, ND, no gaurding, rigidity or rebound. Extremities: No cyanosis, clubbing, B/L Lower Ext shows no edema, patient states that his left toe is tender, doesn't allow me to touch Neurology: Awake alert, and oriented X 3, CN II-XII intact, Non focal Skin:No Rash or lesions Wounds: N/A    Data Review   Basic Metabolic Panel:  Recent Labs Lab 04/09/13 2004  NA 142  K 3.7  CL 106  GLUCOSE 94  BUN 36*  CREATININE 8.20*   Liver Function Tests: No results found for this basename: AST, ALT, ALKPHOS, BILITOT, PROT, ALBUMIN,  in the last 168 hours  CBC:  Recent Labs Lab 04/09/13 [place] 04/09/13 2004  WBC 7.3  --   NEUTROABS 4.7  --   HGB 10.0* 11.6*  HCT 32.4* 34.0*  MCV 83.9  --   PLT 126*  --    ------------------------------------------------------------------------------------------------------------------  Recent Labs  04/13/13 1432  HGBA1C 5.5   ------------------------------------------------------------------------------------------------------------------ No results found for this basename: CHOL, HDL, LDLCALC, TRIG, CHOLHDL, LDLDIRECT,  in the last 72 hours ------------------------------------------------------------------------------------------------------------------ No results found for this basename: TSH, T4TOTAL, FREET3, T3FREE, THYROIDAB,  in the last 72 hours ------------------------------------------------------------------------------------------------------------------ No results found for this basename: VITAMINB12, FOLATE, FERRITIN, TIBC, IRON, RETICCTPCT,  in the last 72 hours  Coagulation profile  No results found for this basename: INR, PROTIME,  in the last 168 hours    Assessment & Plan   Active  Problems: Acute gout: - Patient was that he has a history of gout, will check ESR, CRP, uric acid - Placed on prednisone with taper, patient can resume his usual prednisone dose after the taper, Mobic, tramadol for pain, colchicine  Accelerated hypertension - Will give clonidine 0.2 mg x1 - Patient's nephrologist is currently managing his hypertension with amlodipine and labetalol.   ESRD on hemodialysis: Tuesday Thursday and Saturday  History of kidney transplant: Continue CellCept, prednisone, tacrolimus  Recommendations:Health screening next visit  Follow-up in 1 month   Armie Moren M.D. 04/13/2013, 3:15 PM

## 2013-04-14 LAB — SEDIMENTATION RATE

## 2013-04-21 ENCOUNTER — Other Ambulatory Visit: Payer: Self-pay | Admitting: Internal Medicine

## 2013-04-21 MED ORDER — ALLOPURINOL 100 MG PO TABS: 100.0000 mg | ORAL_TABLET | Freq: Every day | ORAL | Status: DC

## 2013-04-21 NOTE — Progress Notes (Signed)
Quick Note:  Please call the patient to pick up his prescription for allopurinol or he can wait till appt on 10/27 if his gout is okay. ______

## 2013-04-26 ENCOUNTER — Telehealth: Payer: Self-pay | Admitting: Emergency Medicine

## 2013-04-26 NOTE — Telephone Encounter (Signed)
ATTEMPTED TO CALL PT WITH INSTRUCTIONS. NO VOICEMAIL SET UP. WILL TRY AGAIN

## 2013-04-26 NOTE — Telephone Encounter (Signed)
Message copied by Ricci Barker on Mon Apr 26, 2013  3:39 PM ------      Message from: RAI, RIPUDEEP K      Created: Wed Apr 21, 2013 11:44 AM       Please call the patient to pick up his prescription for allopurinol or he can wait till appt on 10/27 if his gout is okay. ------

## 2013-05-08 ENCOUNTER — Emergency Department (HOSPITAL_COMMUNITY)
Admission: EM | Admit: 2013-05-08 | Discharge: 2013-05-08 | Disposition: A | Discharge status: Home / Self Care | Payer: Medicare Other | Attending: Emergency Medicine | Admitting: Emergency Medicine

## 2013-05-08 ENCOUNTER — Encounter (HOSPITAL_COMMUNITY): Payer: Self-pay | Admitting: Emergency Medicine

## 2013-05-08 DIAGNOSIS — F3289 Other specified depressive episodes: Secondary | ICD-10-CM | POA: Insufficient documentation

## 2013-05-08 DIAGNOSIS — Z87891 Personal history of nicotine dependence: Secondary | ICD-10-CM | POA: Insufficient documentation

## 2013-05-08 DIAGNOSIS — Y832 Surgical operation with anastomosis, bypass or graft as the cause of abnormal reaction of the patient, or of later complication, without mention of misadventure at the time of the procedure: Secondary | ICD-10-CM | POA: Insufficient documentation

## 2013-05-08 DIAGNOSIS — N186 End stage renal disease: Secondary | ICD-10-CM | POA: Insufficient documentation

## 2013-05-08 DIAGNOSIS — M79609 Pain in unspecified limb: Secondary | ICD-10-CM | POA: Insufficient documentation

## 2013-05-08 DIAGNOSIS — M79675 Pain in left toe(s): Secondary | ICD-10-CM

## 2013-05-08 DIAGNOSIS — E119 Type 2 diabetes mellitus without complications: Secondary | ICD-10-CM | POA: Insufficient documentation

## 2013-05-08 DIAGNOSIS — IMO0002 Reserved for concepts with insufficient information to code with codable children: Secondary | ICD-10-CM | POA: Insufficient documentation

## 2013-05-08 DIAGNOSIS — F329 Major depressive disorder, single episode, unspecified: Secondary | ICD-10-CM | POA: Insufficient documentation

## 2013-05-08 DIAGNOSIS — I120 Hypertensive chronic kidney disease with stage 5 chronic kidney disease or end stage renal disease: Secondary | ICD-10-CM | POA: Insufficient documentation

## 2013-05-08 DIAGNOSIS — Z791 Long term (current) use of non-steroidal anti-inflammatories (NSAID): Secondary | ICD-10-CM | POA: Insufficient documentation

## 2013-05-08 DIAGNOSIS — Z992 Dependence on renal dialysis: Secondary | ICD-10-CM

## 2013-05-08 DIAGNOSIS — Z79899 Other long term (current) drug therapy: Secondary | ICD-10-CM | POA: Insufficient documentation

## 2013-05-08 DIAGNOSIS — M109 Gout, unspecified: Secondary | ICD-10-CM

## 2013-05-08 LAB — BASIC METABOLIC PANEL
BUN: 80 mg/dL — ABNORMAL HIGH (ref 6–23)
CO2: 15 mEq/L — ABNORMAL LOW (ref 19–32)
Calcium: 8.3 mg/dL — ABNORMAL LOW (ref 8.4–10.5)
Chloride: 104 mEq/L (ref 96–112)
Creatinine, Ser: 14.7 mg/dL — ABNORMAL HIGH (ref 0.50–1.35)
GFR calc Af Amer: 4 mL/min — ABNORMAL LOW (ref >90)
GFR calc non Af Amer: 3 mL/min — ABNORMAL LOW (ref >90)
Glucose, Bld: 87 mg/dL (ref 70–99)
Potassium: 5.2 mEq/L — ABNORMAL HIGH (ref 3.5–5.1)
Sodium: 134 mEq/L — ABNORMAL LOW (ref 135–145)

## 2013-05-08 LAB — CBC
HCT: 29.4 % — ABNORMAL LOW (ref 39.0–52.0)
Hemoglobin: 9.4 g/dL — ABNORMAL LOW (ref 13.0–17.0)
MCH: 25.8 pg — ABNORMAL LOW (ref 26.0–34.0)
MCHC: 32 g/dL (ref 30.0–36.0)
MCV: 80.5 fL (ref 78.0–100.0)
Platelets: 170 10*3/uL (ref 150–400)
RBC: 3.65 MIL/uL — ABNORMAL LOW (ref 4.22–5.81)
RDW: 17.9 % — ABNORMAL HIGH (ref 11.5–15.5)
WBC: 8.1 10*3/uL (ref 4.0–10.5)

## 2013-05-08 MED ORDER — OXYCODONE-ACETAMINOPHEN 7.5-325 MG PO TABS: 1.0000 | ORAL_TABLET | Freq: Three times a day (TID) | ORAL | Status: DC | PRN

## 2013-05-08 MED ORDER — MORPHINE SULFATE 4 MG/ML IJ SOLN
4.0000 mg | Freq: Once | INTRAMUSCULAR | Status: AC
  Administered 2013-05-08: 4 mg via INTRAMUSCULAR
  Filled 2013-05-08: qty 1

## 2013-05-08 NOTE — ED Provider Notes (Signed)
Medical screening examination/treatment/procedure(s) were conducted as a shared visit with non-physician practitioner(s) and myself.  I personally evaluated the patient during the encounter  Patient here to get checked out for dialysis. Missed this week secondary to car trouble. Denies SOB, CP, DOE. Also having L great toe pain he believes secondary to gout. Lungs clear, no JVD, no peripheral edema. L great toe without effusion, redness, swelling.  No concern for volume overload or septic toe.  Labs show K of 5.2. Stable for discharge.   Osvaldo Shipper, MD 05/08/13 [phone]

## 2013-05-08 NOTE — ED Provider Notes (Signed)
CSN: [id number]     Arrival date & time 05/08/13  1204 History   First MD Initiated Contact with Patient 05/08/13 1218     Chief Complaint  Patient presents with  . Vascular Access Problem  . Foot Pain   (Consider location/radiation/quality/duration/timing/severity/associated sxs/prior Treatment) HPI Pt is a 47yo male with hx of renal failure who is scheduled for Tuesday/Thursday/Saturday dialysis presenting today after being told by dialysis treatment center to come have his blood work checked as he has missed dialysis for 1 week due to car trouble.  Pt denies any symptoms at this time related to missing dialysis.  States he usually knows when he needs to come in for dialysis, however states he has scheduled and appointment for Monday dialysis and just needs blood work per their request.  Denies chest pain, SOB, abdominal pain, n/v/d.    Pt also has a hx of gout and is c/o persistent left foot pain that is sharp and throbbing.  Pain has waxed and waned over the last 2-3 months, worse over the last 1-2 days.  Pain is 8/10, worse with palpation including light touch.  Denies fever, warmth, or redness. Denies injury to foot.  States he takes multiple medications for gout including pain medication, percocet, but ran out 3-4 days ago.  He is scheduled to see a pain specialist November 28th.    Past Medical History  Diagnosis Date  . Hypertension   . Renal failure   . Depression   . Complication of anesthesia     itching, sore throat  . Diabetes mellitus without complication    Past Surgical History  Procedure Laterality Date  . Kidney receipient    . Nephrectomy transplanted organ    . Capd insertion    . Capd removal     History reviewed. No pertinent family history. History  Substance Use Topics  . Smoking status: Former Research scientist (life sciences)  . Smokeless tobacco: Never Used     Comment: quit Jan 2014  . Alcohol Use: No    Review of Systems  Constitutional: Negative for fever, chills and  fatigue.  Gastrointestinal: Negative for nausea, vomiting, abdominal pain and diarrhea.  Musculoskeletal: Positive for arthralgias.  Neurological: Negative for light-headedness and headaches.  All other systems reviewed and are negative.    Allergies  Darvocet  Home Medications   Current Outpatient Rx  Name  Route  Sig  Dispense  Refill  . acetaminophen (TYLENOL) 500 MG tablet   Oral   Take 1,000 mg by mouth every 6 (six) hours as needed for pain.         [name] allopurinol (ZYLOPRIM) 100 MG tablet   Oral   Take 1 tablet (100 mg total) by mouth daily.   30 tablet   6   . amLODipine (NORVASC) 5 MG tablet   Oral   Take 2 tablets (10 mg total) by mouth daily.         . cloNIDine (CATAPRES) 0.2 MG tablet   Oral   Take 0.2 mg by mouth daily.         . colchicine 0.6 MG tablet   Oral   Take 1 tablet (0.6 mg total) by mouth daily.   30 tablet   3   . labetalol (NORMODYNE) 200 MG tablet   Oral   Take 200 mg by mouth 3 (three) times daily.         . meloxicam (MOBIC) 7.5 MG tablet   Oral   Take 1 tablet (  7.5 mg total) by mouth daily.   30 tablet   0   . mycophenolate (CELLCEPT) 500 MG tablet   Oral   Take 500 mg by mouth 2 (two) times daily.         . predniSONE (DELTASONE) 10 MG tablet   Oral   Take 20 mg by mouth daily.         . tacrolimus (PROGRAF) 5 MG capsule   Oral   Take 10 mg by mouth 2 (two) times daily.          . traMADol (ULTRAM) 50 MG tablet   Oral   Take 1 tablet (50 mg total) by mouth every 8 (eight) hours as needed for pain.   60 tablet   0   . valGANciclovir (VALCYTE) 450 MG tablet   Oral   Take 450 mg by mouth every Monday, Wednesday, and Friday.         [name] oxyCODONE-acetaminophen (PERCOCET) 7.5-325 MG per tablet   Oral   Take 1 tablet by mouth 3 (three) times daily as needed for pain.   15 tablet   0    BP 191/117  Pulse 71  Temp(Src) 98.4 F (36.9 C) (Oral)  Resp 18  SpO2 100% Physical Exam  Nursing note and  vitals reviewed. Constitutional: He appears well-developed and well-nourished.  Pt lying comfortably in exam bed, NAD.   HENT:  Head: Normocephalic and atraumatic.  Eyes: Conjunctivae are normal. No scleral icterus.  Neck: Normal range of motion.  Cardiovascular: Normal rate, regular rhythm and normal heart sounds.   Pulmonary/Chest: Effort normal and breath sounds normal. No respiratory distress. He has no wheezes. He has no rales. He exhibits no tenderness.  Dialysis port in right chest. No respiratory distress, able to speak in full sentences w/o difficulty.  Lungs: CTAB  Abdominal: Soft. Bowel sounds are normal. He exhibits no distension and no mass. There is no tenderness. There is no rebound and no guarding.  Musculoskeletal: Normal range of motion. He exhibits tenderness. He exhibits no edema.       Left foot: He exhibits tenderness. He exhibits normal range of motion and no deformity.       Feet:  Tenderness to light touch over dorsal aspect of left foot and along left great toe.  No warmth or erythema.  Neurological: He is alert.  Skin: Skin is warm and dry. No erythema.    ED Course  Procedures (including critical care time) Labs Review Labs Reviewed  CBC - Abnormal; Notable for the following:    RBC 3.65 (*)    Hemoglobin 9.4 (*)    HCT 29.4 (*)    MCH 25.8 (*)    RDW 17.9 (*)    All other components within normal limits  BASIC METABOLIC PANEL - Abnormal; Notable for the following:    Sodium 134 (*)    Potassium 5.2 (*)    CO2 15 (*)    BUN 80 (*)    Creatinine, Ser 14.70 (*)    Calcium 8.3 (*)    GFR calc non Af Amer 3 (*)    GFR calc Af Amer 4 (*)    All other components within normal limits   Imaging Review No results found.  EKG Interpretation   None       MDM   1. Gout   2. Toe pain, left   3. Dialysis patient    Pt presenting for blood work per request of dialysis center after  pt missed 1 week of dialysis. Pt also c/o increased left foot and  toe pain due to gout.  No evidence of cellulitis or injury. Ordered: CBC and BMP.   Tx: morphine IM.  Labs: unremarkable, consistent with previous and consistent with hx of missing dialysis.  No need for emergent dialysis at this time.  Advised pt to limit potassium intake.   Rx: percocet.  Advised to keep f/u appointment for Monday, 10/20, dialysis as well as keep appointment with pain specialist Nov 28th.  All labs/imaging/findings discussed with patient. All questions answered and concerns addressed.  Return precautions given. Pt verbalized understanding and agreement with tx plan. Vitals: unremarkable. Discharged in stable condition.    Discussed pt with attending during ED encounter and agrees with plan.     Noland Fordyce, PA-C 05/08/13 1357

## 2013-05-08 NOTE — ED Notes (Signed)
Pt here c/o left foot pain that is from gout; pt sts has not been to dialysis x 1 week and sent here for eval and possible dialysis

## 2013-05-17 ENCOUNTER — Ambulatory Visit: Payer: Medicare Other

## 2013-05-19 ENCOUNTER — Emergency Department (HOSPITAL_COMMUNITY)
Admission: EM | Admit: 2013-05-19 | Discharge: 2013-05-19 | Disposition: A | Discharge status: Home / Self Care | Payer: Medicare Other | Source: Home / Self Care | Attending: Emergency Medicine | Admitting: Emergency Medicine

## 2013-05-19 ENCOUNTER — Emergency Department (HOSPITAL_COMMUNITY)
Admission: EM | Admit: 2013-05-19 | Discharge: 2013-05-19 | Disposition: A | Discharge status: Home / Self Care | Payer: Medicare Other | Attending: Emergency Medicine | Admitting: Emergency Medicine

## 2013-05-19 ENCOUNTER — Encounter (HOSPITAL_COMMUNITY): Payer: Self-pay | Admitting: Emergency Medicine

## 2013-05-19 DIAGNOSIS — Z992 Dependence on renal dialysis: Secondary | ICD-10-CM | POA: Insufficient documentation

## 2013-05-19 DIAGNOSIS — Z940 Kidney transplant status: Secondary | ICD-10-CM | POA: Insufficient documentation

## 2013-05-19 DIAGNOSIS — G8929 Other chronic pain: Secondary | ICD-10-CM | POA: Insufficient documentation

## 2013-05-19 DIAGNOSIS — Z79899 Other long term (current) drug therapy: Secondary | ICD-10-CM | POA: Insufficient documentation

## 2013-05-19 DIAGNOSIS — M79609 Pain in unspecified limb: Secondary | ICD-10-CM | POA: Insufficient documentation

## 2013-05-19 DIAGNOSIS — F3289 Other specified depressive episodes: Secondary | ICD-10-CM | POA: Insufficient documentation

## 2013-05-19 DIAGNOSIS — F329 Major depressive disorder, single episode, unspecified: Secondary | ICD-10-CM | POA: Insufficient documentation

## 2013-05-19 DIAGNOSIS — M109 Gout, unspecified: Secondary | ICD-10-CM | POA: Insufficient documentation

## 2013-05-19 DIAGNOSIS — N19 Unspecified kidney failure: Secondary | ICD-10-CM | POA: Insufficient documentation

## 2013-05-19 DIAGNOSIS — I120 Hypertensive chronic kidney disease with stage 5 chronic kidney disease or end stage renal disease: Secondary | ICD-10-CM | POA: Insufficient documentation

## 2013-05-19 DIAGNOSIS — N186 End stage renal disease: Secondary | ICD-10-CM | POA: Insufficient documentation

## 2013-05-19 DIAGNOSIS — I129 Hypertensive chronic kidney disease with stage 1 through stage 4 chronic kidney disease, or unspecified chronic kidney disease: Secondary | ICD-10-CM | POA: Insufficient documentation

## 2013-05-19 DIAGNOSIS — Z87891 Personal history of nicotine dependence: Secondary | ICD-10-CM | POA: Insufficient documentation

## 2013-05-19 DIAGNOSIS — E119 Type 2 diabetes mellitus without complications: Secondary | ICD-10-CM | POA: Insufficient documentation

## 2013-05-19 DIAGNOSIS — IMO0002 Reserved for concepts with insufficient information to code with codable children: Secondary | ICD-10-CM | POA: Insufficient documentation

## 2013-05-19 DIAGNOSIS — M79675 Pain in left toe(s): Secondary | ICD-10-CM

## 2013-05-19 MED ORDER — TRAMADOL HCL 50 MG PO TABS: 50.0000 mg | ORAL_TABLET | Freq: Three times a day (TID) | ORAL | Status: DC | PRN

## 2013-05-19 MED ORDER — PREDNISONE 20 MG PO TABS: ORAL_TABLET | ORAL | Status: DC

## 2013-05-19 NOTE — ED Provider Notes (Signed)
Medical screening examination/treatment/procedure(s) were performed by non-physician practitioner and as supervising physician I was immediately available for consultation/collaboration.  Richarda Blade, MD 05/19/13 [phone]

## 2013-05-19 NOTE — ED Notes (Signed)
Pt seen at [facility] this am. States his foot pain was not relieved by meds given.

## 2013-05-19 NOTE — ED Notes (Signed)
Pt c/o left foot pain that is chronic in nature for gout; pt sts went to [facility] today and was given meds that he already takes that did not help pain; pt sts increased pain and needs other meds; pt feels more hypertensive than normal due to pain level

## 2013-05-19 NOTE — ED Provider Notes (Signed)
CSN: [id number]     Arrival date & time 05/19/13  7425 History   First MD Initiated Contact with Patient 05/19/13 1006     Chief Complaint  Patient presents with  . Toe Pain   (Consider location/radiation/quality/duration/timing/severity/associated sxs/prior Treatment) HPI  47 year old male with history of renal failure currently on Tuesday-Thursday-Saturday dialysis and history of gout presenting complaining of toe pain. Patient states he has recurrent flare up of left great toe pain and was diagnosed with gout several month ago. He is scheduled to followup with a pain clinic on November 14. For the past 4 days he has had worsening pain to left great toe described as a throbbing sensation, persistent, worsening with palpation. Pain similar to prior pain. States whenever his pain is high blood pressure also goes up. He has been taking blood pressure medication as prescribed. Denies having any fever, numbness, or rash. No specific treatment tried. He admits his last dialysis due to the severity of his pain. No other complaints.  Past Medical History  Diagnosis Date  . Hypertension   . Renal failure   . Depression   . Complication of anesthesia     itching, sore throat  . Diabetes mellitus without complication    Past Surgical History  Procedure Laterality Date  . Kidney receipient    . Nephrectomy transplanted organ    . Capd insertion    . Capd removal     History reviewed. No pertinent family history. History  Substance Use Topics  . Smoking status: Former Research scientist (life sciences)  . Smokeless tobacco: Never Used     Comment: quit Jan 2014  . Alcohol Use: No    Review of Systems  Constitutional: Negative for fever.  Skin: Negative for rash and wound.  Neurological: Negative for numbness.    Allergies  Darvocet  Home Medications   Current Outpatient Rx  Name  Route  Sig  Dispense  Refill  . acetaminophen (TYLENOL) 500 MG tablet   Oral   Take 1,000 mg by mouth every 6 (six) hours as  needed for pain.         [name] allopurinol (ZYLOPRIM) 100 MG tablet   Oral   Take 1 tablet (100 mg total) by mouth daily.   30 tablet   6   . amLODipine (NORVASC) 5 MG tablet   Oral   Take 2 tablets (10 mg total) by mouth daily.         . cloNIDine (CATAPRES) 0.2 MG tablet   Oral   Take 0.2 mg by mouth daily.         . colchicine 0.6 MG tablet   Oral   Take 1 tablet (0.6 mg total) by mouth daily.   30 tablet   3   . labetalol (NORMODYNE) 200 MG tablet   Oral   Take 200 mg by mouth 3 (three) times daily.         . meloxicam (MOBIC) 7.5 MG tablet   Oral   Take 1 tablet (7.5 mg total) by mouth daily.   30 tablet   0   . mycophenolate (CELLCEPT) 500 MG tablet   Oral   Take 500 mg by mouth 2 (two) times daily.         [name] oxyCODONE-acetaminophen (PERCOCET) 7.5-325 MG per tablet   Oral   Take 1 tablet by mouth 3 (three) times daily as needed for pain.   15 tablet   0   . predniSONE (DELTASONE) 10 MG tablet  Oral   Take 20 mg by mouth daily.         . tacrolimus (PROGRAF) 5 MG capsule   Oral   Take 10 mg by mouth 2 (two) times daily.          . traMADol (ULTRAM) 50 MG tablet   Oral   Take 1 tablet (50 mg total) by mouth every 8 (eight) hours as needed for pain.   60 tablet   0   . valGANciclovir (VALCYTE) 450 MG tablet   Oral   Take 450 mg by mouth every Monday, Wednesday, and Friday.          BP 186/118  Pulse 77  Temp(Src) 98 F (36.7 C) (Oral)  Resp 20  SpO2 98% Physical Exam  Nursing note and vitals reviewed. Constitutional: He appears well-developed and well-nourished.  HENT:  Mouth/Throat: Oropharynx is clear and moist.  Eyes: Conjunctivae are normal.  Neck: Neck supple.  Cardiovascular: Normal rate and regular rhythm.   Pulmonary/Chest: Effort normal and breath sounds normal.  Port-A-Cath on chest, appears noninfected  Abdominal: Soft. There is no tenderness.  Musculoskeletal: He exhibits tenderness (L foot: tenderness  throughout L great toe without warmth, erythema, edema, deformity, or rash.  No evidence of infection.  able to move toe appropriately.  intact distal pedal pulses).  Neurological: He is alert.  Psychiatric: He has a normal mood and affect.    ED Course  Procedures (including critical care time)  10:47 AM Pt with acute on chronic L great toe pain.  Sts he has hx of gout.  Toe does not appear to be infected.  Doubt septic arthritis.  No gouty appearance, no fb, no deformity.  Pt needs to f/u with pain management as previously scheduled for further care.  Will d/c with prednisone and ultram.  Care discussed with attending.  Pt also aware that BP is elevated and will need to have it recheck by his PCP.    Labs Review Labs Reviewed - No data to display Imaging Review No results found.  EKG Interpretation   None       MDM   1. Great toe pain, left    BP 186/118  Pulse 77  Temp(Src) 98 F (36.7 C) (Oral)  Resp 20  SpO2 98%     [name], PA-C 05/19/13 1049

## 2013-05-19 NOTE — ED Provider Notes (Signed)
CSN: [id number]     Arrival date & time 05/19/13  1722 History  This chart was scribed for non-physician practitioner Abigail Butts, PA-C working with Ephraim Hamburger, MD by Rolanda Lundborg, ED Scribe. This patient was seen in room TR07C/TR07C and the patient's care was started at 6:14 PM.   Chief Complaint  Patient presents with  . Foot Pain   The history is provided by the patient. No language interpreter was used.   HPI Comments: Frank [name] is a 47 y.o. male with a h/o HTN, DM, and renal failure currently on Tuesday-Thursday-Saturday dialysis who presents to the Emergency Department complaining of constant throbbing left great toe pain. This pain is chronic for him but has worsened in the last 4 days. He reports the pain is the same as his previous gouty flares.  Pt was seen at [facility] this morning and states the pain meds they gave are not helping. He reports his BP got up to 200 today due to pain. He reports his blood pressure is always high when he is in pain. He is seeing a podiatrist 11/17.  He is also scheduled to see a pain clinic on 06/04/2013.  Denies having any fever, numbness, or rash. No specific treatments tried. No other complaints.   Past Medical History  Diagnosis Date  . Hypertension   . Renal failure   . Depression   . Complication of anesthesia     itching, sore throat  . Diabetes mellitus without complication    Past Surgical History  Procedure Laterality Date  . Kidney receipient    . Nephrectomy transplanted organ    . Capd insertion    . Capd removal     History reviewed. No pertinent family history. History  Substance Use Topics  . Smoking status: Former Research scientist (life sciences)  . Smokeless tobacco: Never Used     Comment: quit Jan 2014  . Alcohol Use: No    Review of Systems  Constitutional: Negative for fever, diaphoresis, appetite change, fatigue and unexpected weight change.  HENT: Negative for mouth sores.   Eyes: Negative for visual disturbance.   Respiratory: Negative for cough, chest tightness, shortness of breath and wheezing.   Cardiovascular: Negative for chest pain.  Gastrointestinal: Negative for nausea, vomiting, abdominal pain, diarrhea and constipation.  Endocrine: Negative for polydipsia, polyphagia and polyuria.  Genitourinary: Negative for dysuria, urgency, frequency and hematuria.  Musculoskeletal: Positive for arthralgias (left great toe). Negative for back pain and neck stiffness.  Skin: Negative for rash.  Allergic/Immunologic: Negative for immunocompromised state.  Neurological: Negative for syncope, light-headedness and headaches.  Hematological: Does not bruise/bleed easily.  Psychiatric/Behavioral: Negative for sleep disturbance. The patient is not nervous/anxious.     Allergies  Darvocet  Home Medications   Current Outpatient Rx  Name  Route  Sig  Dispense  Refill  . allopurinol (ZYLOPRIM) 100 MG tablet   Oral   Take 1 tablet (100 mg total) by mouth daily.   30 tablet   6   . amLODipine (NORVASC) 5 MG tablet   Oral   Take 2 tablets (10 mg total) by mouth daily.         . cloNIDine (CATAPRES) 0.2 MG tablet   Oral   Take 0.2 mg by mouth daily.         . colchicine 0.6 MG tablet   Oral   Take 1 tablet (0.6 mg total) by mouth daily.   30 tablet   3   . labetalol (  NORMODYNE) 200 MG tablet   Oral   Take 200 mg by mouth 3 (three) times daily.         . meloxicam (MOBIC) 7.5 MG tablet   Oral   Take 1 tablet (7.5 mg total) by mouth daily.   30 tablet   0   . mycophenolate (CELLCEPT) 500 MG tablet   Oral   Take 500 mg by mouth 2 (two) times daily.         [name] oxyCODONE-acetaminophen (PERCOCET) 10-325 MG per tablet   Oral   Take 1 tablet by mouth every 4 (four) hours as needed for pain.         [name] tacrolimus (PROGRAF) 5 MG capsule   Oral   Take 10 mg by mouth 2 (two) times daily.          . traMADol (ULTRAM) 50 MG tablet   Oral   Take 1 tablet (50 mg total) by mouth every 8  (eight) hours as needed for pain.   8 tablet   0   . valGANciclovir (VALCYTE) 450 MG tablet   Oral   Take 450 mg by mouth every Monday, Wednesday, and Friday.         . predniSONE (DELTASONE) 20 MG tablet      3 tabs po day one, then 2 tabs daily x 4 days   11 tablet   0    BP 179/112  Pulse 88  Temp(Src) 99.1 F (37.3 C) (Oral)  Resp 16  Wt 172 lb 11.2 oz (78.336 kg)  BMI 22.79 kg/m2  SpO2 99% Physical Exam  Nursing note and vitals reviewed. Constitutional: He appears well-developed and well-nourished. No distress.  Awake, alert, nontoxic appearance  HENT:  Head: Normocephalic and atraumatic.  Mouth/Throat: Oropharynx is clear and moist. No oropharyngeal exudate.  Eyes: Conjunctivae are normal. No scleral icterus.  Neck: Normal range of motion. Neck supple.  Cardiovascular: Normal rate, regular rhythm and intact distal pulses.   Pulmonary/Chest: Effort normal and breath sounds normal. No respiratory distress. He has no wheezes.  Abdominal: Soft. Bowel sounds are normal. He exhibits no mass. There is no tenderness. There is no rebound and no guarding.  Musculoskeletal: Normal range of motion. He exhibits no edema.  Pain to palpation of the MTP joint of the great toe of left foot. Mild erythema and increased warmth. no cellulitis, no induration, no fluctuance.  Neurological: He is alert.  Speech is clear and goal oriented Moves extremities without ataxia  Skin: Skin is warm and dry. He is not diaphoretic.  Psychiatric: He has a normal mood and affect.    ED Course  Procedures (including critical care time) Medications - No data to display  DIAGNOSTIC STUDIES: Oxygen Saturation is 99% on RA, normal by my interpretation.    COORDINATION OF CARE: 6:21 PM- Discussed treatment plan with pt. Pt agrees to plan.    Labs Review Labs Reviewed - No data to display Imaging Review No results found.  EKG Interpretation   None       MDM   1. Gout   2. Chronic  pain     AMIAS HUTCHINSON presents from [facility] complaining of dental pain in the left great toe.  Pt presents with monoarticular pain, swelling and erythema.  Pt is afebrile and stable. I reviewed available ER/hospitalization records through the EMR.  Patient was seen this morning at [facility].  Patient was discharged with prescriptions for prednisone and Ultram which  she is taking in the past with adequate relief. He was specifically not given any narcotic pain medicine. Patient left [place] [facility] without either of his prescriptions because he states he has plenty at home with both.  Patient noted to be hypertensive in the emergency department.  No signs of hypertensive urgency.  Patient reports this always happens when he has pain.   I discussed with the patient denies we will not prescribe him narcotic pain medication. He should begin taking his prednisone and Ultram for pain control. Also recommended he followup with his primary care physician for further evaluation of his blood pressure. Patient is to see pain management for further pain control.   It has been determined that no acute conditions requiring further emergency intervention are present at this time. The patient/guardian have been advised of the diagnosis and plan. We have discussed signs and symptoms that warrant return to the ED, such as changes or worsening in symptoms.   Vital signs are stable at discharge; pt remains hypertensive.   BP 179/112  Pulse 88  Temp(Src) 99.1 F (37.3 C) (Oral)  Resp 16  Wt 172 lb 11.2 oz (78.336 kg)  BMI 22.79 kg/m2  SpO2 99%  Patient/guardian has voiced understanding and agreed to follow-up with the PCP or specialist.  I personally performed the services described in this documentation, which was scribed in my presence. The recorded information has been reviewed and is accurate.      Jarrett Soho Leeya Rusconi, PA-C 05/19/13 1942

## 2013-05-20 NOTE — ED Provider Notes (Signed)
Medical screening examination/treatment/procedure(s) were performed by non-physician practitioner and as supervising physician I was immediately available for consultation/collaboration.  EKG Interpretation   None         Ephraim Hamburger, MD 05/20/13 0120

## 2013-05-22 ENCOUNTER — Emergency Department (HOSPITAL_COMMUNITY)
Admission: EM | Admit: 2013-05-22 | Discharge: 2013-05-22 | Disposition: A | Discharge status: Home / Self Care | Payer: Medicare Other | Attending: Emergency Medicine | Admitting: Emergency Medicine

## 2013-05-22 ENCOUNTER — Encounter (HOSPITAL_COMMUNITY): Payer: Self-pay | Admitting: Emergency Medicine

## 2013-05-22 DIAGNOSIS — Z79899 Other long term (current) drug therapy: Secondary | ICD-10-CM | POA: Insufficient documentation

## 2013-05-22 DIAGNOSIS — Z992 Dependence on renal dialysis: Secondary | ICD-10-CM | POA: Insufficient documentation

## 2013-05-22 DIAGNOSIS — IMO0002 Reserved for concepts with insufficient information to code with codable children: Secondary | ICD-10-CM | POA: Insufficient documentation

## 2013-05-22 DIAGNOSIS — Z87891 Personal history of nicotine dependence: Secondary | ICD-10-CM | POA: Insufficient documentation

## 2013-05-22 DIAGNOSIS — E119 Type 2 diabetes mellitus without complications: Secondary | ICD-10-CM | POA: Insufficient documentation

## 2013-05-22 DIAGNOSIS — E875 Hyperkalemia: Secondary | ICD-10-CM

## 2013-05-22 DIAGNOSIS — F3289 Other specified depressive episodes: Secondary | ICD-10-CM | POA: Insufficient documentation

## 2013-05-22 DIAGNOSIS — I120 Hypertensive chronic kidney disease with stage 5 chronic kidney disease or end stage renal disease: Secondary | ICD-10-CM | POA: Insufficient documentation

## 2013-05-22 DIAGNOSIS — N186 End stage renal disease: Secondary | ICD-10-CM

## 2013-05-22 DIAGNOSIS — F329 Major depressive disorder, single episode, unspecified: Secondary | ICD-10-CM | POA: Insufficient documentation

## 2013-05-22 LAB — BASIC METABOLIC PANEL
Calcium: 7.7 mg/dL — ABNORMAL LOW (ref 8.4–10.5)
Calcium: 8.3 mg/dL — ABNORMAL LOW (ref 8.4–10.5)
GFR calc Af Amer: 4 mL/min — ABNORMAL LOW (ref >90)
GFR calc Af Amer: 4 mL/min — ABNORMAL LOW (ref >90)
GFR calc non Af Amer: 3 mL/min — ABNORMAL LOW (ref >90)
GFR calc non Af Amer: 3 mL/min — ABNORMAL LOW (ref >90)
Potassium: 6.4 mEq/L (ref 3.5–5.1)
Potassium: 5.4 mEq/L — ABNORMAL HIGH (ref 3.5–5.1)
Sodium: 132 mEq/L — ABNORMAL LOW (ref 135–145)
Sodium: 135 mEq/L (ref 135–145)

## 2013-05-22 LAB — GLUCOSE, CAPILLARY: Glucose-Capillary: 91 mg/dL (ref 70–99)

## 2013-05-22 MED ORDER — DEXTROSE 50 % IV SOLN: 1.0000 | Freq: Once | INTRAVENOUS | Status: DC

## 2013-05-22 MED ORDER — ONDANSETRON 4 MG PO TBDP
4.0000 mg | ORAL_TABLET | Freq: Once | ORAL | Status: AC
  Administered 2013-05-22: 4 mg via ORAL
  Filled 2013-05-22: qty 1

## 2013-05-22 MED ORDER — SODIUM POLYSTYRENE SULFONATE 15 GM/60ML PO SUSP
50.0000 g | Freq: Once | ORAL | Status: AC
  Administered 2013-05-22: 50 g via ORAL
  Filled 2013-05-22: qty 240

## 2013-05-22 MED ORDER — CALCIUM GLUCONATE 10 % IV SOLN: 1.0000 g | Freq: Once | INTRAVENOUS | Status: DC

## 2013-05-22 MED ORDER — INSULIN ASPART 100 UNIT/ML IV SOLN: 5.0000 [IU] | Freq: Once | INTRAVENOUS | Status: DC

## 2013-05-22 NOTE — ED Notes (Signed)
Checked patient blood sugar it was 91 notified Public affairs consultant

## 2013-05-22 NOTE — ED Provider Notes (Signed)
6:08 Pm Care transferred from Dr. Regenia Skeeter at [facility] on 47 y.o. male with hx of ESRD on HD who missed 4 treatments and presented from UC.  Pt's initial K 6.4, improved to 5.4 with improvement of T waves after kayexalate.  Have followed up w/ Dr. Florene Glen with nephrology.  Pt will go to HD on Monday. He states he feels improved.  Denies CP, ab pain SOB, leg pain or swelling.  Have discussed importance of going to HD even if he feels well.   1. Hyperkalemia   2. ESRD (end stage renal disease)      Neta Ehlers, MD 05/23/13 1124

## 2013-05-22 NOTE — ED Provider Notes (Signed)
CSN: [id number]     Arrival date & time 05/22/13  0865 History   First MD Initiated Contact with Patient 05/22/13 [phone]     Chief Complaint  Patient presents with  . Vascular Access Problem   (Consider location/radiation/quality/duration/timing/severity/associated sxs/prior Treatment) HPI Comments: 47 year old male with a history of renal failure and kidney transplant as is the ED for dialysis. He states he is Mrs. last 4 dialysis treatments. He states he felt generally weak which she feels is a symptom he gets when he needs more dialysis. This all started this morning. Denies a shortness of breath, cough, chest pain, or leg swelling. He tried to go to his dialysis center, [facility] in [facility], but they state that he must be dialyzed by nephrologist and go to the ER since he has missed 3 sessions.  He has been on dialysis for 6 months as his kidney transplant is worsening.   Past Medical History  Diagnosis Date  . Hypertension   . Renal failure   . Depression   . Complication of anesthesia     itching, sore throat  . Diabetes mellitus without complication    Past Surgical History  Procedure Laterality Date  . Kidney receipient    . Nephrectomy transplanted organ    . Capd insertion    . Capd removal     No family history on file. History  Substance Use Topics  . Smoking status: Former Research scientist (life sciences)  . Smokeless tobacco: Never Used     Comment: quit Jan 2014  . Alcohol Use: No    Review of Systems  Constitutional: Negative for fever.  Respiratory: Negative for cough and shortness of breath.   Cardiovascular: Negative for chest pain and leg swelling.  Gastrointestinal: Negative for vomiting and abdominal pain.  Neurological: Positive for weakness (generalized).  All other systems reviewed and are negative.    Allergies  Darvocet  Home Medications   Current Outpatient Rx  Name  Route  Sig  Dispense  Refill  . allopurinol (ZYLOPRIM) 100 MG tablet   Oral   Take 1  tablet (100 mg total) by mouth daily.   30 tablet   6   . cloNIDine (CATAPRES) 0.2 MG tablet   Oral   Take 0.2 mg by mouth daily.         . colchicine 0.6 MG tablet   Oral   Take 1 tablet (0.6 mg total) by mouth daily.   30 tablet   3   . labetalol (NORMODYNE) 200 MG tablet   Oral   Take 200 mg by mouth 3 (three) times daily.         . meloxicam (MOBIC) 7.5 MG tablet   Oral   Take 1 tablet (7.5 mg total) by mouth daily.   30 tablet   0   . mycophenolate (CELLCEPT) 500 MG tablet   Oral   Take 500 mg by mouth 2 (two) times daily.         [name] oxyCODONE-acetaminophen (PERCOCET) 10-325 MG per tablet   Oral   Take 1 tablet by mouth every 4 (four) hours as needed for pain.         . predniSONE (DELTASONE) 20 MG tablet      3 tabs po day one, then 2 tabs daily x 4 days   11 tablet   0   . tacrolimus (PROGRAF) 5 MG capsule   Oral   Take 10 mg by mouth 2 (two) times daily.          [name]  traMADol (ULTRAM) 50 MG tablet   Oral   Take 1 tablet (50 mg total) by mouth every 8 (eight) hours as needed for pain.   8 tablet   0   . valGANciclovir (VALCYTE) 450 MG tablet   Oral   Take 450 mg by mouth every Monday, Wednesday, and Friday.          BP 143/87  Pulse 67  Temp(Src) 97 F (36.1 C) (Oral)  Resp 18  Ht 6' 1.5" (1.867 m)  Wt 175 lb (79.379 kg)  BMI 22.77 kg/m2  SpO2 98% Physical Exam  Nursing note and vitals reviewed. Constitutional: He is oriented to person, place, and time. He appears well-developed and well-nourished.  HENT:  Head: Normocephalic and atraumatic.  Right Ear: External ear normal.  Left Ear: External ear normal.  Nose: Nose normal.  Eyes: Right eye exhibits no discharge. Left eye exhibits no discharge.  Neck: Neck supple.  Cardiovascular: Normal rate, regular rhythm, normal heart sounds and intact distal pulses.   Pulmonary/Chest: Effort normal and breath sounds normal. Not tachypneic. No respiratory distress.  Abdominal: Soft. There  is no tenderness.  Musculoskeletal: He exhibits no edema.  Neurological: He is alert and oriented to person, place, and time.  Skin: Skin is warm and dry.    ED Course  Procedures (including critical care time) Labs Review Labs Reviewed  BASIC METABOLIC PANEL - Abnormal; Notable for the following:    Sodium 132 (*)    Potassium 6.4 (*)    CO2 13 (*)    BUN 73 (*)    Creatinine, Ser 15.51 (*)    Calcium 7.7 (*)    GFR calc non Af Amer 3 (*)    GFR calc Af Amer 4 (*)    All other components within normal limits  BASIC METABOLIC PANEL   Imaging Review No results found.  EKG Interpretation     Ventricular Rate:  69 PR Interval:  168 QRS Duration: 82 QT Interval:  416 QTC Calculation: 446 R Axis:     Text Interpretation:  Age not entered, assumed to be  47 years old for purpose of ECG interpretation Sinus rhythm Left ventricular hypertrophy Borderline T abnormalities, inferior leads Baseline wander in lead(s) V1 V2 V3 V4 mildly peaked T waves in precordial leads Otherwise no significant change            MDM   1. Hyperkalemia   2. ESRD (end stage renal disease)    Discussed the case with the nephrologist on call, Dr. Florene Glen, who states that as this patient does not appear ill or have signs of fluid overload he can receive 50 g of Kayexalate and recheck BMP in 2 hours. As long as his potassium is going down he can followup at his regular dialysis center for dialysis. Initially vomited when he took half of the kayexalate, then was able to take with zofran. Care transferred to Dr. Tawnya Crook with the repeat BMP pending.     Ephraim Hamburger, MD 05/22/13 [phone]

## 2013-05-22 NOTE — ED Notes (Signed)
Pt reports here for dialysis treatment. Dialysis days are T,TH,Sat. Pt reports that he missed 4 treatments. Pt denies shortness of breath, chest pain or edema.

## 2013-05-22 NOTE — ED Notes (Signed)
Pt vomitted 1/5 of kayexalate. Dr Regenia Skeeter notified. Ordered zofran. Then attempt to drink other half of kayexalate.

## 2013-05-22 NOTE — ED Notes (Signed)
Patient discharged to home with family. NAD.

## 2013-06-24 ENCOUNTER — Encounter (HOSPITAL_COMMUNITY): Payer: Self-pay | Admitting: Emergency Medicine

## 2013-06-24 ENCOUNTER — Observation Stay (HOSPITAL_COMMUNITY)
Admission: EM | Admit: 2013-06-24 | Discharge: 2013-06-24 | Discharge status: Against Medical Advice | Payer: Medicare Other | Attending: Internal Medicine | Admitting: Internal Medicine

## 2013-06-24 DIAGNOSIS — M109 Gout, unspecified: Secondary | ICD-10-CM | POA: Diagnosis present

## 2013-06-24 DIAGNOSIS — N186 End stage renal disease: Secondary | ICD-10-CM

## 2013-06-24 DIAGNOSIS — R112 Nausea with vomiting, unspecified: Secondary | ICD-10-CM | POA: Diagnosis present

## 2013-06-24 DIAGNOSIS — E119 Type 2 diabetes mellitus without complications: Secondary | ICD-10-CM | POA: Insufficient documentation

## 2013-06-24 DIAGNOSIS — Z992 Dependence on renal dialysis: Secondary | ICD-10-CM | POA: Insufficient documentation

## 2013-06-24 DIAGNOSIS — R1110 Vomiting, unspecified: Secondary | ICD-10-CM

## 2013-06-24 DIAGNOSIS — E872 Acidosis, unspecified: Secondary | ICD-10-CM | POA: Diagnosis present

## 2013-06-24 DIAGNOSIS — Z91158 Patient's noncompliance with renal dialysis for other reason: Secondary | ICD-10-CM | POA: Insufficient documentation

## 2013-06-24 DIAGNOSIS — R197 Diarrhea, unspecified: Secondary | ICD-10-CM | POA: Diagnosis present

## 2013-06-24 DIAGNOSIS — G8929 Other chronic pain: Secondary | ICD-10-CM | POA: Insufficient documentation

## 2013-06-24 DIAGNOSIS — Z9115 Patient's noncompliance with renal dialysis: Secondary | ICD-10-CM | POA: Insufficient documentation

## 2013-06-24 DIAGNOSIS — Z87891 Personal history of nicotine dependence: Secondary | ICD-10-CM | POA: Insufficient documentation

## 2013-06-24 DIAGNOSIS — E875 Hyperkalemia: Principal | ICD-10-CM | POA: Insufficient documentation

## 2013-06-24 DIAGNOSIS — I120 Hypertensive chronic kidney disease with stage 5 chronic kidney disease or end stage renal disease: Secondary | ICD-10-CM | POA: Insufficient documentation

## 2013-06-24 DIAGNOSIS — N2581 Secondary hyperparathyroidism of renal origin: Secondary | ICD-10-CM | POA: Diagnosis present

## 2013-06-24 DIAGNOSIS — D631 Anemia in chronic kidney disease: Secondary | ICD-10-CM | POA: Diagnosis present

## 2013-06-24 DIAGNOSIS — Z940 Kidney transplant status: Secondary | ICD-10-CM | POA: Insufficient documentation

## 2013-06-24 HISTORY — DX: End stage renal disease: N18.6

## 2013-06-24 LAB — HEPATIC FUNCTION PANEL
ALT: 11 U/L (ref 0–53)
AST: 13 U/L (ref 0–37)
Bilirubin, Direct: 0.1 mg/dL (ref 0.0–0.3)
Total Bilirubin: 0.2 mg/dL — ABNORMAL LOW (ref 0.3–1.2)
Total Protein: 7.1 g/dL (ref 6.0–8.3)

## 2013-06-24 LAB — RENAL FUNCTION PANEL
Albumin: 3.1 g/dL — ABNORMAL LOW (ref 3.5–5.2)
BUN: 108 mg/dL — ABNORMAL HIGH (ref 6–23)
CO2: 13 mEq/L — ABNORMAL LOW (ref 19–32)
Calcium: 7.9 mg/dL — ABNORMAL LOW (ref 8.4–10.5)
Creatinine, Ser: 18.26 mg/dL — ABNORMAL HIGH (ref 0.50–1.35)
Glucose, Bld: 30 mg/dL — CL (ref 70–99)
Potassium: 5.1 mEq/L (ref 3.5–5.1)

## 2013-06-24 LAB — GLUCOSE, CAPILLARY
Glucose-Capillary: 66 mg/dL — ABNORMAL LOW (ref 70–99)
Glucose-Capillary: 84 mg/dL (ref 70–99)

## 2013-06-24 LAB — CBC WITH DIFFERENTIAL/PLATELET
Basophils Relative: 0 % (ref 0–1)
Eosinophils Absolute: 0.2 10*3/uL (ref 0.0–0.7)
Lymphs Abs: 2 10*3/uL (ref 0.7–4.0)
MCH: 25.4 pg — ABNORMAL LOW (ref 26.0–34.0)
MCHC: 31.4 g/dL (ref 30.0–36.0)
Neutro Abs: 3.8 10*3/uL (ref 1.7–7.7)
Neutrophils Relative %: 53 % (ref 43–77)
Platelets: 174 10*3/uL (ref 150–400)
RBC: 3.39 MIL/uL — ABNORMAL LOW (ref 4.22–5.81)
WBC: 7.1 10*3/uL (ref 4.0–10.5)

## 2013-06-24 LAB — BASIC METABOLIC PANEL
CO2: 12 mEq/L — ABNORMAL LOW (ref 19–32)
GFR calc Af Amer: 3 mL/min — ABNORMAL LOW (ref >90)
GFR calc non Af Amer: 3 mL/min — ABNORMAL LOW (ref >90)
Glucose, Bld: 93 mg/dL (ref 70–99)
Potassium: 6.5 mEq/L (ref 3.5–5.1)
Sodium: 136 mEq/L (ref 135–145)

## 2013-06-24 LAB — HEPATITIS B SURFACE ANTIGEN: Hepatitis B Surface Ag: NEGATIVE

## 2013-06-24 MED ORDER — PREDNISONE 20 MG PO TABS
20.0000 mg | ORAL_TABLET | Freq: Every day | ORAL | Status: DC
  Filled 2013-06-24: qty 1

## 2013-06-24 MED ORDER — AMLODIPINE BESYLATE 10 MG PO TABS
10.0000 mg | ORAL_TABLET | Freq: Every day | ORAL | Status: DC
  Administered 2013-06-24: 10 mg via ORAL
  Filled 2013-06-24: qty 1

## 2013-06-24 MED ORDER — SODIUM BICARBONATE 8.4 % IV SOLN
50.0000 meq | Freq: Once | INTRAVENOUS | Status: AC
  Administered 2013-06-24: 50 meq via INTRAVENOUS
  Filled 2013-06-24: qty 50

## 2013-06-24 MED ORDER — CALCIUM GLUCONATE 10 % IV SOLN
1.0000 g | Freq: Once | INTRAVENOUS | Status: AC
  Administered 2013-06-24: 1 g via INTRAVENOUS
  Filled 2013-06-24: qty 10

## 2013-06-24 MED ORDER — ALLOPURINOL 100 MG PO TABS
100.0000 mg | ORAL_TABLET | Freq: Every day | ORAL | Status: DC
  Administered 2013-06-24: 100 mg via ORAL
  Filled 2013-06-24: qty 1

## 2013-06-24 MED ORDER — SODIUM CHLORIDE 0.9 % IJ SOLN
3.0000 mL | Freq: Two times a day (BID) | INTRAMUSCULAR | Status: DC
Start: 2013-06-24 — End: 2013-06-24

## 2013-06-24 MED ORDER — MYCOPHENOLATE MOFETIL 250 MG PO CAPS
500.0000 mg | ORAL_CAPSULE | Freq: Two times a day (BID) | ORAL | Status: DC
  Administered 2013-06-24: 500 mg via ORAL
  Filled 2013-06-24 (×2): qty 2

## 2013-06-24 MED ORDER — VALGANCICLOVIR HCL 450 MG PO TABS: 450.0000 mg | ORAL_TABLET | ORAL | Status: DC

## 2013-06-24 MED ORDER — DARBEPOETIN ALFA-POLYSORBATE 100 MCG/0.5ML IJ SOLN
INTRAMUSCULAR | Status: AC
  Administered 2013-06-24: 100 ug via INTRAVENOUS
  Filled 2013-06-24: qty 0.5

## 2013-06-24 MED ORDER — HEPARIN SODIUM (PORCINE) 5000 UNIT/ML IJ SOLN
5000.0000 [IU] | Freq: Three times a day (TID) | INTRAMUSCULAR | Status: DC
  Filled 2013-06-24 (×3): qty 1

## 2013-06-24 MED ORDER — COLCHICINE 0.6 MG PO TABS
0.6000 mg | ORAL_TABLET | Freq: Every day | ORAL | Status: DC
  Administered 2013-06-24: 0.6 mg via ORAL
  Filled 2013-06-24: qty 1

## 2013-06-24 MED ORDER — DEXTROSE 50 % IV SOLN
50.0000 mL | Freq: Once | INTRAVENOUS | Status: AC
  Administered 2013-06-24: 50 mL via INTRAVENOUS
  Filled 2013-06-24: qty 50

## 2013-06-24 MED ORDER — LABETALOL HCL 200 MG PO TABS
200.0000 mg | ORAL_TABLET | Freq: Three times a day (TID) | ORAL | Status: DC
  Filled 2013-06-24 (×2): qty 1

## 2013-06-24 MED ORDER — TACROLIMUS 1 MG PO CAPS
10.0000 mg | ORAL_CAPSULE | Freq: Two times a day (BID) | ORAL | Status: DC
  Filled 2013-06-24 (×2): qty 10

## 2013-06-24 MED ORDER — DARBEPOETIN ALFA-POLYSORBATE 100 MCG/0.5ML IJ SOLN
100.0000 ug | Freq: Once | INTRAMUSCULAR | Status: AC
  Administered 2013-06-24: 100 ug via INTRAVENOUS

## 2013-06-24 MED ORDER — CLONIDINE HCL 0.2 MG PO TABS
0.2000 mg | ORAL_TABLET | Freq: Every day | ORAL | Status: DC
  Administered 2013-06-24: 0.2 mg via ORAL
  Filled 2013-06-24: qty 1

## 2013-06-24 MED ORDER — DOXERCALCIFEROL 4 MCG/2ML IV SOLN
4.0000 ug | INTRAVENOUS | Status: DC
  Administered 2013-06-24: 4 ug via INTRAVENOUS

## 2013-06-24 MED ORDER — INSULIN ASPART 100 UNIT/ML IV SOLN
10.0000 [IU] | Freq: Once | INTRAVENOUS | Status: AC
  Administered 2013-06-24: 10 [IU] via INTRAVENOUS
  Filled 2013-06-24: qty 0.1

## 2013-06-24 MED ORDER — ONDANSETRON HCL 4 MG/2ML IJ SOLN
4.0000 mg | Freq: Once | INTRAMUSCULAR | Status: AC
  Administered 2013-06-24: 4 mg via INTRAVENOUS
  Filled 2013-06-24: qty 2

## 2013-06-24 MED ORDER — DOXERCALCIFEROL 4 MCG/2ML IV SOLN
INTRAVENOUS | Status: AC
  Administered 2013-06-24: 4 ug via INTRAVENOUS
  Filled 2013-06-24: qty 2

## 2013-06-24 MED ORDER — MYCOPHENOLATE MOFETIL 500 MG PO TABS: 500.0000 mg | ORAL_TABLET | Freq: Two times a day (BID) | ORAL | Status: DC

## 2013-06-24 NOTE — ED Provider Notes (Signed)
47 year old male with end-stage renal disease on hemodialysis has missed his dialysis for the last 8 days because he was visiting family in [place] and had not made arrangements for dialysis. He called his dialysis Center this morning and they were concerned and sent him to the ED where laboratory workup shows severe hyperkalemia. He is complaining of nausea and vomiting today but denies abdominal pain, chest pain, dyspnea. On exam, he is resting comfortably. Lungs are clear and heart has regular rate and rhythm. Abdomen is soft and nontender. He does not have significant edema. Laboratory has come back showing severe hyperkalemia. His given intravenous dextrose, insulin, calcium, sodium bicarbonate to treat his hyperkalemia and he will need to be dialyzed emergently. Of note, ECG is not significantly changed from prior ECG but review of old records shows that he actually was hyperkalemic at that time his prior ECG was obtained.  Medical screening examination/treatment/procedure(s) were conducted as a shared visit with non-physician practitioner(s) and myself.  I personally evaluated the patient during the encounter.  EKG Interpretation    Date/Time:  Thursday June 24 2013 07:01:34 EST Ventricular Rate:  69 PR Interval:  164 QRS Duration: 92 QT Interval:  437 QTC Calculation: 468 R Axis:   -8 Text Interpretation:  Incomplete analysis due to missing data in precordial lead(s) Sinus rhythm Ventricular premature complex Left ventricular hypertrophy Abnormal T, consider ischemia, diffuse leads Missing lead(s): V1 V2 When compared with ECG of 05/22/2013, No significant change was found Confirmed by [name]  MD, Linzie Criss ([id number]) on 06/24/2013 7:22:05 AM              Delora Fuel, MD 52/84/13 2440

## 2013-06-24 NOTE — ED Notes (Signed)
Pt taken to dialysis

## 2013-06-24 NOTE — ED Provider Notes (Signed)
CSN: [id number]     Arrival date & time 06/24/13  0347 History   First MD Initiated Contact with Patient 06/24/13 0617     Chief Complaint  Patient presents with  . Emesis   (Consider location/radiation/quality/duration/timing/severity/associated sxs/prior Treatment) HPI This is a 47 year old male with end-stage renal disease who presents the emergency department for dialysis and emesis.  The patient has not been dialyzed since Tuesday of last week 8 days ago.  He was visiting family out of town Patient states that he called his dialysis clinic yesterday they stated he needed to come to the emergency department and be admitted for dialysis.  Patient also states that this morning he began vomiting he had nausea no abdominal pain, chest pain, shortness of breath.  He states that his nausea is resolved and is asking to eat. Denies fevers, chills, myalgias, arthralgias. Denies DOE, SOB, chest tightness or pressure, radiation to left arm, jaw or back, or diaphoresis. Denies dysuria, flank pain, suprapubic pain, frequency, urgency, or hematuria. Denies headaches, light headedness, weakness, visual disturbances. Denies abdominal pain, diarrhea or constipation.    Past Medical History  Diagnosis Date  . Hypertension   . Renal failure   . Depression   . Complication of anesthesia     itching, sore throat  . Diabetes mellitus without complication    Past Surgical History  Procedure Laterality Date  . Kidney receipient    . Nephrectomy transplanted organ    . Capd insertion    . Capd removal     History reviewed. No pertinent family history. History  Substance Use Topics  . Smoking status: Former Research scientist (life sciences)  . Smokeless tobacco: Never Used     Comment: quit Jan 2014  . Alcohol Use: No    Review of Systems Ten systems reviewed and are negative for acute change, except as noted in the HPI.   Allergies  Darvocet  Home Medications   Current Outpatient Rx  Name  Route  Sig  Dispense   Refill  . allopurinol (ZYLOPRIM) 100 MG tablet   Oral   Take 1 tablet (100 mg total) by mouth daily.   30 tablet   6   . amLODipine (NORVASC) 5 MG tablet   Oral   Take 10 mg by mouth daily.         . cloNIDine (CATAPRES) 0.2 MG tablet   Oral   Take 0.2 mg by mouth daily.         . colchicine 0.6 MG tablet   Oral   Take 1 tablet (0.6 mg total) by mouth daily.   30 tablet   3   . labetalol (NORMODYNE) 200 MG tablet   Oral   Take 200 mg by mouth 3 (three) times daily.         . meloxicam (MOBIC) 7.5 MG tablet   Oral   Take 1 tablet (7.5 mg total) by mouth daily.   30 tablet   0   . mycophenolate (CELLCEPT) 500 MG tablet   Oral   Take 500 mg by mouth 2 (two) times daily.         [name] oxyCODONE-acetaminophen (PERCOCET) 10-325 MG per tablet   Oral   Take 1 tablet by mouth every 4 (four) hours as needed for pain.         . predniSONE (DELTASONE) 20 MG tablet   Oral   Take 20 mg by mouth daily with breakfast.         . tacrolimus (  PROGRAF) 5 MG capsule   Oral   Take 10 mg by mouth 2 (two) times daily.          . valGANciclovir (VALCYTE) 450 MG tablet   Oral   Take 450 mg by mouth every Monday, Wednesday, and Friday.          BP 171/112  Pulse 80  Temp(Src) 98 F (36.7 C) (Oral)  Resp 16  Ht 6' 1.5" (1.867 m)  Wt 180 lb (81.647 kg)  BMI 23.42 kg/m2  SpO2 98% Physical Exam Physical Exam  Nursing note and vitals reviewed. Constitutional: He appears well-developed and well-nourished. No distress.  HENT:  Head: Normocephalic and atraumatic.  Eyes: Conjunctivae normal are normal. No scleral icterus.  Neck: Normal range of motion. Neck supple.  Cardiovascular: Normal rate, regular rhythm and normal heart sounds.  No edema. Pulmonary/Chest: Effort normal and breath sounds normal. No respiratory distress.  Abdominal: Soft. There is no tenderness.  Musculoskeletal: He exhibits no edema.  Neurological: He is alert.  Skin: Skin is warm and dry. He is  not diaphoretic.  Psychiatric: His behavior is normal.    ED Course  Procedures (including critical care time) Labs Review Labs Reviewed  CBC WITH DIFFERENTIAL - Abnormal; Notable for the following:    RBC 3.39 (*)    Hemoglobin 8.6 (*)    HCT 27.4 (*)    MCH 25.4 (*)    RDW 19.4 (*)    Monocytes Relative 16 (*)    Monocytes Absolute 1.2 (*)    All other components within normal limits  BASIC METABOLIC PANEL - Abnormal; Notable for the following:    Potassium 6.5 (*)    CO2 12 (*)    BUN 105 (*)    Creatinine, Ser 18.04 (*)    Calcium 7.5 (*)    GFR calc non Af Amer 3 (*)    GFR calc Af Amer 3 (*)    All other components within normal limits  HEPATIC FUNCTION PANEL  MAGNESIUM  LIPASE, BLOOD   Imaging Review No results found.  EKG Interpretation   None     CRITICAL CARE Performed by: Margarita Mail   Total critical care time: 30  Critical care time was exclusive of separately billable procedures and treating other patients.  Critical care was necessary to treat or prevent imminent or life-threatening deterioration.  Critical care was time spent personally by me on the following activities: development of treatment plan with patient and/or surrogate as well as nursing, discussions with consultants, evaluation of patient's response to treatment, examination of patient, obtaining history from patient or surrogate, ordering and performing treatments and interventions, ordering and review of laboratory studies, ordering and review of radiographic studies, pulse oximetry and re-evaluation of patient's condition.   MDM   1. Hyperkalemia   2. ESRD (end stage renal disease)   3. Emesis    7:20 AM BP 171/112  Pulse 80  Temp(Src) 98 F (36.7 C) (Oral)  Resp 16  Ht 6' 1.5" (1.867 m)  Wt 180 lb (81.647 kg)  BMI 23.42 kg/m2  SpO2 98% Patient with Severe Hyperkalemisa. Needs emergent dialysis. Patient given 1 amp D-50, 10 units insulin, 1 amp Bicarb, I amp calcium  gluconate.Unable to obtain full ekg (leads V1,V2 unable to read) and patient refusing another.  Other leads show no change from previous  Patient may have Peaked T waves without QRS widening.   7:48 AM I have spoken with Dr. Lorrene Reid. The patient is followed at  [facility].  Patient is apparently very non-compliant, Has not been dialyzed since 11/22. POC at [facility] Is for him to come to ED if he misses 3 appointments.   7:55 AM I have spoken with Dr. Fransisca Kaufmann who will admit the patient to obs.     Margarita Mail, PA-C 06/24/13 [phone]

## 2013-06-24 NOTE — Progress Notes (Signed)
Hypoglycemic Event  CBG:27  Treatment: Phillip Heal crackers and apple juice  Symptoms: clammy, diaphoretic  Follow-up CBG: [id number] CBG Result:66  Possible Reasons for Event:Has not eaten, received insulin in ED for high K  Comments/MD notified:Dr. Lorrene Reid at bedside, given another apple juice    Elisabeth Cara  Remember to initiate Hypoglycemia Order Set & complete

## 2013-06-24 NOTE — ED Notes (Signed)
Admitting md at bedside

## 2013-06-24 NOTE — ED Notes (Signed)
Pt stated that he missed dialysis since last Tuesday due to the holidays, Pt usually goes Tuesday Thursday Saturday, Pt is here for dialysis but pt must have lab work done before he can have dialysis

## 2013-06-24 NOTE — ED Notes (Signed)
Pt c/o needing dialysis, but before that can be preformed he needs labwork done.Marland KitchenMarland Kitchen

## 2013-06-24 NOTE — Consult Note (Signed)
Requesting Physician:  EDP Reason for Consult:  ESRD patient in need of urgent HD HPI: The patient is a 47 y.o. year-old AAM with a history of ESRD, s/p failed renal transplant (but with some residual function, and maintained on antirejection meds), on dialysis in [place] at [facility] (primary nephrology via [facility]). Other probs include HTN, secondary hyperparathyroidism, GERD.   Pt's last HD was 06/12/13 (and he signed off early according to staff at his unit).  He went out of town with no arrangements for dialysis.  His nephrologists have instructed that if he misses more than 3 treatments in a row that he has to dialyzed at a hospital, so he presented to [facility] today.  Potassium in the ED was 6.5, bicarb 12, BUN over 100.  We are asked to urgently dialyze.  Patient states that he still makes a fair amount of urine despite his failed transplant, and still takes immunosuppressive medications.  He has refused permanent access and dialyzes via tunnelled dialysis catheter.  Past Medical History  Diagnosis Date  . Hypertension   . ESRD (end stage renal disease)     due to HTN per patient, followed at [facility], s/p failed kidney transplant  . Depression   . Complication of anesthesia     itching, sore throat  . Diabetes mellitus without complication     No history per patient, but remains under history as A1c would not be accurate given on dialysis    Past Surgical History  Procedure Laterality Date  . Kidney receipient    . Nephrectomy transplanted organ    . Capd insertion    . Capd removal      Family History: History reviewed. No pertinent family history. Social History:  reports that he has quit smoking. He has never used smokeless tobacco. He reports that he does not drink alcohol or use illicit drugs.  Allergies  Allergen Reactions  . Darvocet [Propoxyphene-Acetaminophen] Hives    Home medications: Prior to Admission medications   Medication Sig Start Date End Date Taking?  Authorizing Provider  allopurinol (ZYLOPRIM) 100 MG tablet Take 1 tablet (100 mg total) by mouth daily. 04/21/13  Yes [name], MD  amLODipine (NORVASC) 5 MG tablet Take 10 mg by mouth daily.   Yes Historical Provider, MD  cloNIDine (CATAPRES) 0.2 MG tablet Take 0.2 mg by mouth daily.   Yes Historical Provider, MD  colchicine 0.6 MG tablet Take 1 tablet (0.6 mg total) by mouth daily. 04/13/13  Yes [name], MD  labetalol (NORMODYNE) 200 MG tablet Take 200 mg by mouth 3 (three) times daily.   Yes Historical Provider, MD  meloxicam (MOBIC) 7.5 MG tablet Take 1 tablet (7.5 mg total) by mouth daily. 04/13/13  Yes [name], MD  mycophenolate (CELLCEPT) 500 MG tablet Take 500 mg by mouth 2 (two) times daily.   Yes Historical Provider, MD  oxyCODONE-acetaminophen (PERCOCET) 10-325 MG per tablet Take 1 tablet by mouth every 4 (four) hours as needed for pain.   Yes Historical Provider, MD  predniSONE (DELTASONE) 20 MG tablet Take 20 mg by mouth daily with breakfast.   Yes Historical Provider, MD  tacrolimus (PROGRAF) 5 MG capsule Take 10 mg by mouth 2 (two) times daily.    Yes Historical Provider, MD  valGANciclovir (VALCYTE) 450 MG tablet Take 450 mg by mouth every Monday, Wednesday, and Friday.   Yes Historical Provider, MD    Inpatient medications: . cloNIDine  0.2 mg Oral Once  Review of Systems Gen:  Denies headache, reports episode of emesis and diaphoresis last evening HEENT:  No visual change, sore throat, difficulty swallowing. Resp:  No difficulty breathing, DOE.  No cough or hemoptysis. Cardiac:  No chest pain, orthopnea, PND.  Denies edema. GI:   Denies abdominal pain.   Had clear emesis last evening GU:  Denies difficulty or change in voiding.  No change in urine color. Still makes "fair amount of urine"   MS:  Denies joint pain or swelling.   Derm:  Denies skin rash or itching.  Chronic dry skin Neuro:   Denies focal weakness, memory problems, hx stroke or TIA.    Psych:  Denies symptoms of depression of anxiety.  No hallucination.    Physical Exam:  BP 155/101  Pulse 80  Temp(Src) 98 F (36.7 C) (Oral)  Resp 14  Ht 6' 1.5" (1.867 m)  Wt 81.647 kg (180 lb)  BMI 23.42 kg/m2  SpO2 98%  Gen: diaphoretic AAM (glucose 26 after glucose/insulin given in the ED) but not in distress VS as above Skin: scaly icthyotic changes of skin over both LE's Neck: no JVD, no bruits or LAN Right IJ tunnelled dialysis catheter No drainage at exit site and non-tender. Chest: Crackles at both lung bases Heart: regular, no rub or gallop S1S2 No S3 Abdomen: soft, scars from prior transplant and PD catheters  No focal tenderness Ext: trace edema Neuro: alert, Ox3, no focal deficit Heme/Lymph: no bruising or LAN   Recent Labs Lab 06/24/13 0535  NA 136  K 6.5*  CL 105  CO2 12*  GLUCOSE 93  BUN 105*  CREATININE 18.04*  CALCIUM 7.5*    Recent Labs Lab 06/24/13 0703  AST 13  ALT 11  ALKPHOS 91  BILITOT 0.2*  PROT 7.1  ALBUMIN 3.0*    Recent Labs Lab 06/24/13 0703  LIPASE 76*   Recent Labs Lab 06/24/13 0535  WBC 7.1  NEUTROABS 3.8  HGB 8.6*  HCT 27.4*  MCV 80.8  PLT 174   Outpt dialysis prescription: [facility] TTS 4 hours 400/800 Right IJ TDC Heparin 3400 bolus, 500/hour;  2K2.5Ca Aranesp 85 mcg once/week  Hectorol 4 mcg 1st 2 treatments of the week, 6 mcg 3rd tmt of the week  Allergic to Ferrelicit  Impression/Plan 1. Life threatening hyperkalemia secondary to missed dialysis for 12 days - urgent HD now 2. ESRD  - TTS HD [place]-non compliant with TMT; last HD was 06/12/13 3. Anemia - on Aranesp weekly as outpt Give dose with HD today 4. Secondary hyperparathyroidism - on hectorol and sensipar; give 4 mcg hectorol today with HD 5. Failed renal transplant - apparently some residual function and is maintained on antirejection meds 6. Medical noncompliance   Jamal Maes,  MD [facility] [phone]  pager 06/24/2013, 8:48 AM

## 2013-06-24 NOTE — H&P (Signed)
Date: 06/24/2013               Patient Name:  Frank [name] MRN: [id number]  DOB: 1965/11/30 Age / Sex: 47 y.o., male   PCP: [place] Service: Internal Medicine Teaching Service         Attending Physician: Dr. Richarda Blade, MD    First Contact: Dr. Denton Brick Pager: [phone]  Second Contact: Dr. Alice Rieger Pager: [phone]       After Hours (After 5p/  First Contact Pager: [phone]  weekends / holidays): Second Contact Pager: [phone]   Chief Complaint: Vomiting  History of Present Illness:  Patient is a 47 year old man with history of ESRD on HD & HTN who presents after 1 episode of NB/NB emesis on the morning of admission, around 2am.  His last HD session was 8 days ago, and has subsequently missed 3 sessions secondary to being out of town in [place] for Thanksgiving.  He continues to make significant urine, so thought that he could manage without HD.  He is usually followed at [facility], and on Tuesday morning he called to schedule HD for Tuesday evening, and he was told to go to the ED for emergent HD since he would be missing 3 sessions.  His plan was to wait until today (Thurs) for HD by [facility] (center in [place]), but decided to come to the ED for precautionary measures after he started vomiting.  He reports some associated SOB with vomiting, but that has resolved now.  He no longer feels nauseous after receiving zofran and is eager to eat. He denies abdominal pain, constipation, diarrhea, chest pain and heart palpitations.     Meds: Current Facility-Administered Medications  Medication Dose Route Frequency Provider Last Rate Last Dose  . cloNIDine (CATAPRES) tablet 0.2 mg  0.2 mg Oral Once [name], MD       Current Outpatient Prescriptions  Medication Sig Dispense Refill  . allopurinol (ZYLOPRIM) 100 MG tablet Take 1 tablet (100 mg total) by mouth daily.  30 tablet  6  . amLODipine (NORVASC) 5 MG tablet Take 10 mg by mouth daily.      . cloNIDine  (CATAPRES) 0.2 MG tablet Take 0.2 mg by mouth daily.      . colchicine 0.6 MG tablet Take 1 tablet (0.6 mg total) by mouth daily.  30 tablet  3  . labetalol (NORMODYNE) 200 MG tablet Take 200 mg by mouth 3 (three) times daily.      . meloxicam (MOBIC) 7.5 MG tablet Take 1 tablet (7.5 mg total) by mouth daily.  30 tablet  0  . mycophenolate (CELLCEPT) 500 MG tablet Take 500 mg by mouth 2 (two) times daily.      [name] oxyCODONE-acetaminophen (PERCOCET) 10-325 MG per tablet Take 1 tablet by mouth every 4 (four) hours as needed for pain.      . predniSONE (DELTASONE) 20 MG tablet Take 20 mg by mouth daily with breakfast.      . tacrolimus (PROGRAF) 5 MG capsule Take 10 mg by mouth 2 (two) times daily.       . valGANciclovir (VALCYTE) 450 MG tablet Take 450 mg by mouth every Monday, Wednesday, and Friday.        Allergies: Allergies as of 06/24/2013 - Review Complete 06/24/2013  Allergen Reaction Noted  . Darvocet [propoxyphene-acetaminophen] Hives 10/16/2011   Past Medical History  Diagnosis Date  . Hypertension   .  ESRD (end stage renal disease)     due to HTN per patient, followed at [facility], s/p failed kidney transplant  . Depression   . Complication of anesthesia     itching, sore throat  . Diabetes mellitus without complication     No history per patient, but remains under history as A1c would not be accurate given on dialysis   Past Surgical History  Procedure Laterality Date  . Kidney receipient  2006    failed and started HD in March 2014  . Capd insertion    . Capd removal     History reviewed. No pertinent family history.  History   Social History  . Marital Status: Married    Spouse Name: N/A    Number of Children: 3  . Years of Education: [employer]   Occupational History  . Not on file.   Social History Main Topics  . Smoking status: Former Smoker -- 1.00 packs/day for 1 years    Types: Cigarettes  . Smokeless tobacco: Never Used     Comment: quit Jan 2014  .  Alcohol Use: No  . Drug Use: No  . Sexual Activity: Not on file   Other Topics Concern  . Not on file   Social History Narrative   Owns own plumbing company    Review of Systems: Constitutional: Denies fever, chills, and fatigue.  HEENT: Denies photophobia, eye pain, redness, hearing loss, ear pain, congestion, sore throat, rhinorrhea, sneezing, mouth sores, trouble swallowing, neck pain, neck stiffness and tinnitus.  Respiratory: Denies DOE, cough, chest tightness, and wheezing.  Cardiovascular: Denies chest pain, palpitations and leg swelling.  Gastrointestinal: Denies abdominal pain, diarrhea, constipation,blood in stool and abdominal distention.  Genitourinary: Denies dysuria, urgency, frequency, hematuria, flank pain and difficulty urinating.  Musculoskeletal: Is on percocet (followed at pain clinic on [address]) for "calcium build up on bones" Skin: Denies pallor, rash and wound.  Neurological: Denies dizziness, seizures, syncope, weakness, lightheadedness, numbness and headaches.    Physical Exam: Blood pressure 155/101, pulse 80, temperature 98 F (36.7 C), temperature source Oral, resp. rate 14, height 6' 1.5" (1.867 m), weight 180 lb (81.647 kg), SpO2 98.00%. on RA General: resting in bed, no acute distress, appears as stated age HEENT: PERRL, EOMI, no scleral icterus Cardiac/Chest: RRR, no rubs, murmurs or gallops, HD catheter in right chest without erythema or swelling Pulm: soft bibasilar crackles, moving normal volumes of air, no respiratory distress Abd: soft, nontender, nondistended, BS normoactive Ext: warm and well perfused, no pedal edema Neuro: alert and oriented X3, cranial nerves II-XII grossly intact   Lab results: Basic Metabolic Panel:  Recent Labs  06/24/13 0535 06/24/13 0703  NA 136  --   K 6.5*  --   CL 105  --   CO2 12*  --   GLUCOSE 93  --   BUN 105*  --   CREATININE 18.04*  --   CALCIUM 7.5*  --   MG  --  1.4*  AG 19  Liver Function  Tests:  Recent Labs  06/24/13 0703  AST 13  ALT 11  ALKPHOS 91  BILITOT 0.2*  PROT 7.1  ALBUMIN 3.0*    Recent Labs  06/24/13 0703  LIPASE 76*   CBC:  Recent Labs  06/24/13 0535  WBC 7.1  NEUTROABS 3.8  HGB 8.6*  HCT 27.4*  MCV 80.8  PLT 174    Imaging results: None  Other results: GHW:EXHBZ, regular with some premature complexes, HR 68, LAD, new  TWI in aVF, old TWI in V6, no sig change in T wave size compared to prior (from 05/22/13, when K 6.4); no wide QRS  Assessment & Plan by Problem: Mr. Remmers is a 47 yo M with history of HTN & ESRD on HD s/p failed renal transplant who is admitted to [facility] on 06/24/13 with nausea and vomiting.  #Nausea/vomiting:  Improved, patient currently asymptomatic; Likely secondary do uremia given no HD x 8 days (3 sessions) over the Thanksgiving break.  Lipase is mildly elevated, but no h/o EtOH use, no description of colicky pain to suggest gall bladder disease (& normal renal function), so this is not likely clinically significant.  Given clinical scenario, not likely ACS - EKG without significant changes from the last time he was hyperkalemic, though unable to obtain full EKG (V1 V2 unable to read), and patient refused a 2nd EKG. Less likely tacrolimus toxicity. -Admit to observation (tele) for emergent HD -Symptoms seem to have resolved, will provide symptomatic relief with zofran again if needed -Monitor, possibly d/c later today  #Hyperkalemia: secondary to HD noncompliance; given Ca gluconate, D50 with insulin and bicarb in ED, EKG does not appear worrisome -Monitor on tele -Emergent HD -Repeat labs post HD  #ESRD on HD: TTS in [place] (by [facility]), missed 3 sessions with subsequent metabolic acidosis (AG 19); similar presentation to ED on 05/22/13 for which he was given kayexalate (K 6.4 --> 5.4), and d/c to follow at primary HD center.  To review his renal history, he initially went on peritoneal dialysis at the age of 47 yo (he  reports d/t HTN), and continued for 6 years until he got a kidney transplant in 2006 which lasted 8 years, and he subsequently began temporary HD in March 2014, with plans to transition to home peritoneal dialysis, but he reports peritoneal diasylate fluid is currently in shortage.   -Emergent HD -Cont prednisone, tacrolimus, cellcept, valcyte (MWF)  #HTN: stable with diastolic sig elevated -Continue home meds which include clonidine, labetalol and amlodipine -emergent HD  #Chronic Pain: patient on percocet prescribed by pain clinic on [address] reportedly due to gout and "calcium build up on bones" - calciphylaxis?  -May cont percocet if needed  #Gout: With recent gout flare noted 3-4 days prior on left big toe, improving -Cont colchicine and allopurinol  #Normocytic Anemia: with history of ACD, Hb slightly lower than what we have in the past, but he has no s/s of acute anemia.  He has been on EPO, and Hb was as high as 10 two months ago, but I suspect with HD noncompliance he has missed his ESA -Monitor for s/s of acute anemia/bleeding  #VTE ppx: Heparin TID  Dispo: Disposition is deferred at this time, awaiting improvement of current medical problems. Anticipated discharge in approximately 1 day(s) - hopeful for d/c after HD  The patient does have a current PCP [facility] [place]) and does not need an [facility] hospital follow-up appointment after discharge.  The patient does not have transportation limitations that hinder transportation to clinic appointments.  Signed: [name], MD 06/24/2013, 8:36 AM

## 2013-06-24 NOTE — Progress Notes (Signed)
Received page that patient was eager to be discharged, pressure earlier this afternoon was 201/122 (at 13:43).  He did receive his antihypertensive medication including amlodipine & clonidine at 14:24.  Repeat BP at 15:13 was 174/104.  I spoke with patient and explained that we could not discharge him given his pressures.  Also, primary team spoke with Dr. Lorrene Reid earlier today, who was concerned about how acidotic he was.  I explained by leaving AMA with his pressures this elevated, he is at risk for heart attack and stroke.  He understands this risk and wishes to proceed to sign himself out AMA.  RN notified as well.

## 2013-06-25 NOTE — Discharge Summary (Signed)
Name: Frank [name] MRN: [id number] DOB: 1965-11-06 47 y.o. PCP: Adin Hector  Date of Admission: 06/24/2013  4:39 AM Date of Discharge: 06/24/2013 Attending Physician: No att. providers found  THIS PATIENT LEFT AGAINST MEDICAL ADVICE ON 06/24/13!!  Discharge Diagnosis: Principal Problem:   Hyperkalemia, diminished renal excretion Active Problems:   Metabolic acidosis   Hypertension   ESRD on dialysis- Tues, Thurs, Sat in [place], nephro: [name]   Secondary hyperparathyroidism (of renal origin)   Anemia of chronic kidney failure   Gout   Chronic pain   Nausea and vomiting  Discharge Medications:   Medication List    ASK your doctor about these medications       allopurinol 100 MG tablet  Commonly known as:  ZYLOPRIM  Take 1 tablet (100 mg total) by mouth daily.     amLODipine 5 MG tablet  Commonly known as:  NORVASC  Take 10 mg by mouth daily.     cloNIDine 0.2 MG tablet  Commonly known as:  CATAPRES  Take 0.2 mg by mouth daily.     colchicine 0.6 MG tablet  Take 1 tablet (0.6 mg total) by mouth daily.     labetalol 200 MG tablet  Commonly known as:  NORMODYNE  Take 200 mg by mouth 3 (three) times daily.     meloxicam 7.5 MG tablet  Commonly known as:  MOBIC  Take 1 tablet (7.5 mg total) by mouth daily.     mycophenolate 500 MG tablet  Commonly known as:  CELLCEPT  Take 500 mg by mouth 2 (two) times daily.     oxyCODONE-acetaminophen 10-325 MG per tablet  Commonly known as:  PERCOCET  Take 1 tablet by mouth every 4 (four) hours as needed for pain.     predniSONE 20 MG tablet  Commonly known as:  DELTASONE  Take 20 mg by mouth daily with breakfast.     tacrolimus 5 MG capsule  Commonly known as:  PROGRAF  Take 10 mg by mouth 2 (two) times daily.     valGANciclovir 450 MG tablet  Commonly known as:  VALCYTE  Take 450 mg by mouth every Monday, Wednesday, and Friday.        Consultations: Treatment Team:  Lucrezia Starch,  MD  Procedures Performed: Hemodialysis  Admission HPI:  Patient is a 47 year old man with history of ESRD on HD & HTN who presents after 1 episode of NB/NB emesis on the morning of admission, around 2am. His last HD session was 8 days ago, and has subsequently missed 3 sessions secondary to being out of town in [place] for Thanksgiving. He continues to make significant urine, so thought that he could manage without HD. He is usually followed at [facility], and on Tuesday morning he called to schedule HD for Tuesday evening, and he was told to go to the ED for emergent HD since he would be missing 3 sessions. His plan was to wait until today (Thurs) for HD by [facility] (center in [place]), but decided to come to the ED for precautionary measures after he started vomiting. He reports some associated SOB with vomiting, but that has resolved now. He no longer feels nauseous after receiving zofran and is eager to eat. He denies abdominal pain, constipation, diarrhea, chest pain and heart palpitations.    Hospital Course by problem list: Mr. Koloski is a 47 yo M with history of HTN & ESRD on HD s/p failed renal transplant who was  admitted to [facility] on 06/24/13 with nausea and vomiting.   #ESRD on HD: Because of his acidosis, he received scheduled HD, but upon primary team's discussion with Nephrology, they were not comfortable discharging on the same day.  In addition, patient's BP was significantly elevated to peak of 203/100 after HD despite receiving BP meds (fell to 174/104), so we were not comfortable discharging him.  Risks of leaving AMA, including stroke and MI were explained to patient. He subsequently still decided to leave AMA. TTS in [place] (by [name]), missed 3 sessions with subsequent metabolic acidosis (AG 19).  To review his renal history, he initially went on peritoneal dialysis at the age of 47 yo (he reports d/t HTN), and continued for 6 years until he got a kidney transplant in 2006 which lasted 8  years, and he subsequently began temporary HD in March 2014, with plans to transition to home peritoneal dialysis, but he reports peritoneal diasylate fluid is currently in shortage.   During hospitalization, we continued prednisone, tacrolimus, cellcept, valcyte (MWF)   #Nausea/vomiting: Improved & patient asymptomatic by admission; Attributed to uremia given no HD x 8 days (3 sessions) over the Thanksgiving break. Lipase was mildly elevated, but no h/o EtOH use, no description of colicky pain to suggest gall bladder disease, so this is not likely clinically significant.  Patient was admitted to observation (tele) for emergent HD.  He received zofran for symptomatic relief.    #Hyperkalemia: secondary to HD noncompliance; given Ca gluconate, D50 with insulin and bicarb in ED, EKG did not appear worrisome. No significant events noted on tele.  Repeat K 5.1 (from 6.5) prior to HD   #HTN: Elevated as noted above.  Home medications including clonidine, labetalol and amlodipine were ordered (though labetalol was not given).  #Chronic Pain: patient on percocet prescribed by pain clinic on [place] reportedly due to gout and "calcium build up on bones" - calciphylaxis? No issue during hospitalization.  #Gout: With recent gout flare noted 3-4 days prior on left big toe, improving; continued colchicine and allopurinol   #Normocytic Anemia: with history of ACD, Hb slightly lower than what we have in the past, but he has no s/s of acute anemia. He has been on EPO, and Hb was as high as 10 two months ago, but I suspect with HD noncompliance he has missed his ESA; ESA administered during HD.  #VTE ppx: Heparin TID was administered during hospitalization.    Discharge Vitals:   BP 191/122  Pulse 90  Temp(Src) 98 F (36.7 C) (Oral)  Resp 18  Ht 6' 1.5" (1.867 m)  Wt 180 lb 5.4 oz (81.8 kg)  BMI 23.47 kg/m2  SpO2 97%  Discharge Labs:  Results for orders placed during the hospital encounter of 06/24/13  (from the past 24 hour(s))  HEPATITIS B SURFACE ANTIGEN     Status: None   Collection Time    06/24/13 10:16 AM      Result Value Range   Hepatitis B Surface Ag NEGATIVE  NEGATIVE    Signed: [name], MD 06/25/2013, 9:49 AM   Time Spent on Discharge: 20 minutes Services Ordered on Discharge: None Equipment Ordered on Discharge: None

## 2013-06-26 NOTE — Discharge Summary (Signed)
Frank [name] left AMA prior to my ability to see him.

## 2013-07-16 ENCOUNTER — Emergency Department (HOSPITAL_COMMUNITY)
Admission: EM | Admit: 2013-07-16 | Discharge: 2013-07-17 | Disposition: A | Discharge status: Home / Self Care | Payer: Medicare Other | Attending: Emergency Medicine | Admitting: Emergency Medicine

## 2013-07-16 ENCOUNTER — Encounter (HOSPITAL_COMMUNITY): Payer: Self-pay | Admitting: Emergency Medicine

## 2013-07-16 ENCOUNTER — Emergency Department (HOSPITAL_COMMUNITY): Payer: Medicare Other

## 2013-07-16 DIAGNOSIS — Z87891 Personal history of nicotine dependence: Secondary | ICD-10-CM | POA: Insufficient documentation

## 2013-07-16 DIAGNOSIS — I120 Hypertensive chronic kidney disease with stage 5 chronic kidney disease or end stage renal disease: Secondary | ICD-10-CM | POA: Insufficient documentation

## 2013-07-16 DIAGNOSIS — E875 Hyperkalemia: Secondary | ICD-10-CM | POA: Insufficient documentation

## 2013-07-16 DIAGNOSIS — IMO0002 Reserved for concepts with insufficient information to code with codable children: Secondary | ICD-10-CM | POA: Insufficient documentation

## 2013-07-16 DIAGNOSIS — Z79899 Other long term (current) drug therapy: Secondary | ICD-10-CM | POA: Insufficient documentation

## 2013-07-16 DIAGNOSIS — Z992 Dependence on renal dialysis: Secondary | ICD-10-CM | POA: Insufficient documentation

## 2013-07-16 DIAGNOSIS — N186 End stage renal disease: Secondary | ICD-10-CM

## 2013-07-16 DIAGNOSIS — E119 Type 2 diabetes mellitus without complications: Secondary | ICD-10-CM | POA: Insufficient documentation

## 2013-07-16 LAB — POCT I-STAT TROPONIN I: Troponin i, poc: 0.04 ng/mL (ref 0.00–0.08)

## 2013-07-16 LAB — CBC
Platelets: 243 10*3/uL (ref 150–400)
RBC: 3.77 MIL/uL — ABNORMAL LOW (ref 4.22–5.81)
RDW: 18.4 % — ABNORMAL HIGH (ref 11.5–15.5)
WBC: 7.6 10*3/uL (ref 4.0–10.5)

## 2013-07-16 NOTE — ED Notes (Addendum)
Pt presents with compliant of shortness of breath that started two nights ago. Pt is a dialysis patient, pt states when his potassium is elevated that he develops shortness of breath. Pt states that he wakes up at night "gasping for air." Pt reports his last dialysis was Wednesday. Pt reports tightness in his abdomen and a sore throat.

## 2013-07-17 LAB — BASIC METABOLIC PANEL
CO2: 19 mEq/L (ref 19–32)
Calcium: 8.3 mg/dL — ABNORMAL LOW (ref 8.4–10.5)
Chloride: 98 mEq/L (ref 96–112)
GFR calc Af Amer: 4 mL/min — ABNORMAL LOW (ref >90)
Potassium: 5.5 mEq/L — ABNORMAL HIGH (ref 3.5–5.1)
Sodium: 132 mEq/L — ABNORMAL LOW (ref 135–145)

## 2013-07-17 MED ORDER — GUAIFENESIN ER 600 MG PO TB12
600.0000 mg | ORAL_TABLET | Freq: Once | ORAL | Status: AC
  Administered 2013-07-17: 600 mg via ORAL
  Filled 2013-07-17: qty 1

## 2013-07-17 NOTE — ED Provider Notes (Signed)
CSN: [id number]     Arrival date & time 07/16/13  2131 History   First MD Initiated Contact with Patient 07/17/13 0008     Chief Complaint  Patient presents with  . Shortness of Breath   (Consider location/radiation/quality/duration/timing/severity/associated sxs/prior Treatment) HPI History provided by patient. Is a dialysis patient, currently receiving dialysis on a holiday schedule. Last dialysis 2 days ago and is scheduled for dialysis in the morning. Today developed some difficulty breathing, concerned that his potassium was elevated and presents here for evaluation. No chest pain. No cough or fevers. Symptoms somewhat worse with exertion. No leg swelling. Symptoms seem to be worse when laying down to sleep. Dyspnea is moderate in severity.  Past Medical History  Diagnosis Date  . Hypertension   . ESRD (end stage renal disease)     due to HTN per patient, followed at [facility], s/p failed kidney transplant  . Depression   . Complication of anesthesia     itching, sore throat  . Diabetes mellitus without complication     No history per patient, but remains under history as A1c would not be accurate given on dialysis   Past Surgical History  Procedure Laterality Date  . Kidney receipient  2006    failed and started HD in March 2014  . Capd insertion    . Capd removal     No family history on file. History  Substance Use Topics  . Smoking status: Former Smoker -- 1.00 packs/day for 1 years    Types: Cigarettes  . Smokeless tobacco: Never Used     Comment: quit Jan 2014  . Alcohol Use: No    Review of Systems  Constitutional: Negative for fever and chills.  Respiratory: Positive for shortness of breath.   Cardiovascular: Negative for chest pain.  Gastrointestinal: Negative for abdominal pain.  Genitourinary: Negative for flank pain.  Musculoskeletal: Negative for neck pain.  Skin: Negative for rash.  Neurological: Negative for weakness.  All other systems reviewed and  are negative.    Allergies  Darvocet and Ferrlecit  Home Medications   Current Outpatient Rx  Name  Route  Sig  Dispense  Refill  . allopurinol (ZYLOPRIM) 100 MG tablet   Oral   Take 1 tablet (100 mg total) by mouth daily.   30 tablet   6   . amLODipine (NORVASC) 5 MG tablet   Oral   Take 10 mg by mouth daily.         . cinacalcet (SENSIPAR) 30 MG tablet   Oral   Take 30 mg by mouth daily.         . cloNIDine (CATAPRES) 0.1 MG tablet   Oral   Take 0.1 mg by mouth 2 (two) times daily.         . colchicine 0.6 MG tablet   Oral   Take 1 tablet (0.6 mg total) by mouth daily.   30 tablet   3   . labetalol (NORMODYNE) 200 MG tablet   Oral   Take 200 mg by mouth 3 (three) times daily.         [name] lisinopril (PRINIVIL,ZESTRIL) 10 MG tablet   Oral   Take 10 mg by mouth daily.         . meloxicam (MOBIC) 7.5 MG tablet   Oral   Take 1 tablet (7.5 mg total) by mouth daily.   30 tablet   0   . mycophenolate (CELLCEPT) 500 MG tablet   Oral  Take 500 mg by mouth 2 (two) times daily.         [name] omeprazole (PRILOSEC) 20 MG capsule   Oral   Take 20 mg by mouth daily.         [name] oxyCODONE-acetaminophen (PERCOCET) 10-325 MG per tablet   Oral   Take 1 tablet by mouth every 4 (four) hours as needed for pain.         . predniSONE (DELTASONE) 20 MG tablet   Oral   Take 20 mg by mouth daily with breakfast.         . sevelamer carbonate (RENVELA) 800 MG tablet   Oral   Take 800 mg by mouth 4 (four) times daily - after meals and at bedtime.         . sulfamethoxazole-trimethoprim (BACTRIM,SEPTRA) 400-80 MG per tablet   Oral   Take 1 tablet by mouth every Monday, Wednesday, and Friday.         . tacrolimus (PROGRAF) 5 MG capsule   Oral   Take 10 mg by mouth 2 (two) times daily.          . valGANciclovir (VALCYTE) 450 MG tablet   Oral   Take 450 mg by mouth every Monday, Wednesday, and Friday.          BP 176/108  Pulse 73  Temp(Src) 97.5  F (36.4 C) (Oral)  Resp 16  SpO2 94% Physical Exam  Constitutional: He is oriented to person, place, and time. He appears well-developed and well-nourished.  HENT:  Head: Normocephalic and atraumatic.  Eyes: EOM are normal. Pupils are equal, round, and reactive to light.  Neck: Neck supple.  Cardiovascular: Normal rate, regular rhythm and intact distal pulses.   Pulmonary/Chest: Effort normal and breath sounds normal. No respiratory distress. He exhibits no tenderness.  Abdominal: Soft. He exhibits no distension. There is no tenderness.  Musculoskeletal: Normal range of motion. He exhibits no tenderness.  Neurological: He is alert and oriented to person, place, and time.  Skin: Skin is warm and dry.    ED Course  Procedures (including critical care time) Labs Review Labs Reviewed  CBC - Abnormal; Notable for the following:    RBC 3.77 (*)    Hemoglobin 9.9 (*)    HCT 31.7 (*)    RDW 18.4 (*)    All other components within normal limits  BASIC METABOLIC PANEL - Abnormal; Notable for the following:    Sodium 132 (*)    Potassium 5.5 (*)    Glucose, Bld 117 (*)    BUN 72 (*)    Creatinine, Ser 13.85 (*)    Calcium 8.3 (*)    GFR calc non Af Amer 4 (*)    GFR calc Af Amer 4 (*)    All other components within normal limits  POCT I-STAT TROPONIN I   Imaging Review Dg Chest 2 View  07/16/2013   CLINICAL DATA:  Shortness of breath and productive cough.  EXAM: CHEST  2 VIEW  COMPARISON:  Chest radiograph performed 08/31/2012  FINDINGS: The lungs are well-aerated. Vascular congestion is noted, with bilateral central airspace opacities, likely reflecting mild pulmonary edema. A small right pleural effusion is seen. Pneumonia might have a similar appearance. No pneumothorax is identified.  The heart is mildly enlarged. No acute osseous abnormalities are seen. A right sided dual-lumen catheter is noted ending within the right atrium.  IMPRESSION: Vascular congestion and mild  cardiomegaly, with bilateral central airspace opacities, likely reflecting mild  pulmonary edema. Small right pleural effusion seen. Pneumonia might have a similar appearance.   Electronically Signed   By: Garald Balding M.D.   On: 07/16/2013 23:18    EKG Interpretation    Date/Time:  Friday July 16 2013 22:42:37 EST Ventricular Rate:  69 PR Interval:  147 QRS Duration: 84 QT Interval:  458 QTC Calculation: 491 R Axis:   -5 Text Interpretation:  Sinus rhythm Nonspecific ST and T wave abnormality Borderline prolonged QT interval No significant change since last tracing Confirmed by Macio Kissoon  MD, Alexx [phone]) on 07/17/2013 5:05:27 AM           Patient resting comfortably without hypoxia or respiratory distress. On recheck states his dyspnea has resolved.   Requesting something for congestion prior to discharge  5:05 AM I offered patient admission for dialysis and he declines going to [facility] cone. He drove himself to the emergency department and prefers to drive himself to dialysis at 7 AM. Reliable historian states understanding strict return precautions. Stable for discharge to dialysis Center   MDM  Diagnosis: End-stage renal disease   Evaluated with EKG, labs and chest x-ray reviewed as above Symptoms resolved in ED Offered admission/ he declined. Discharged directly to dialysis Center. Vital signs nurse's notes reviewed and considered  Teressa Lower, MD 07/17/13 [phone]

## 2013-07-17 NOTE — ED Notes (Signed)
Pt states he has not missed any dialysis tx. Pt given ginger ale to drink but counseled on fluid overload. Pt does have congested unproductive cough noted.

## 2013-07-17 NOTE — ED Notes (Signed)
Pt provided crackers and applesauce prior to d/c

## 2013-09-07 ENCOUNTER — Emergency Department (HOSPITAL_COMMUNITY)
Admission: EM | Admit: 2013-09-07 | Discharge: 2013-09-07 | Disposition: A | Discharge status: Home / Self Care | Payer: Medicare Other | Attending: Emergency Medicine | Admitting: Emergency Medicine

## 2013-09-07 ENCOUNTER — Encounter (HOSPITAL_COMMUNITY): Payer: Self-pay | Admitting: Emergency Medicine

## 2013-09-07 ENCOUNTER — Emergency Department (HOSPITAL_COMMUNITY): Payer: Medicare Other

## 2013-09-07 DIAGNOSIS — N186 End stage renal disease: Secondary | ICD-10-CM | POA: Insufficient documentation

## 2013-09-07 DIAGNOSIS — Z87891 Personal history of nicotine dependence: Secondary | ICD-10-CM | POA: Insufficient documentation

## 2013-09-07 DIAGNOSIS — Z792 Long term (current) use of antibiotics: Secondary | ICD-10-CM | POA: Insufficient documentation

## 2013-09-07 DIAGNOSIS — I10 Essential (primary) hypertension: Secondary | ICD-10-CM

## 2013-09-07 DIAGNOSIS — Z992 Dependence on renal dialysis: Secondary | ICD-10-CM | POA: Insufficient documentation

## 2013-09-07 DIAGNOSIS — F3289 Other specified depressive episodes: Secondary | ICD-10-CM | POA: Insufficient documentation

## 2013-09-07 DIAGNOSIS — E119 Type 2 diabetes mellitus without complications: Secondary | ICD-10-CM | POA: Insufficient documentation

## 2013-09-07 DIAGNOSIS — F329 Major depressive disorder, single episode, unspecified: Secondary | ICD-10-CM | POA: Insufficient documentation

## 2013-09-07 DIAGNOSIS — R059 Cough, unspecified: Secondary | ICD-10-CM | POA: Insufficient documentation

## 2013-09-07 DIAGNOSIS — R519 Headache, unspecified: Secondary | ICD-10-CM

## 2013-09-07 DIAGNOSIS — R51 Headache: Secondary | ICD-10-CM

## 2013-09-07 DIAGNOSIS — R05 Cough: Secondary | ICD-10-CM | POA: Insufficient documentation

## 2013-09-07 DIAGNOSIS — IMO0002 Reserved for concepts with insufficient information to code with codable children: Secondary | ICD-10-CM | POA: Insufficient documentation

## 2013-09-07 DIAGNOSIS — I129 Hypertensive chronic kidney disease with stage 1 through stage 4 chronic kidney disease, or unspecified chronic kidney disease: Secondary | ICD-10-CM | POA: Insufficient documentation

## 2013-09-07 DIAGNOSIS — N189 Chronic kidney disease, unspecified: Secondary | ICD-10-CM

## 2013-09-07 DIAGNOSIS — J3489 Other specified disorders of nose and nasal sinuses: Secondary | ICD-10-CM | POA: Insufficient documentation

## 2013-09-07 DIAGNOSIS — Z791 Long term (current) use of non-steroidal anti-inflammatories (NSAID): Secondary | ICD-10-CM | POA: Insufficient documentation

## 2013-09-07 DIAGNOSIS — Z79899 Other long term (current) drug therapy: Secondary | ICD-10-CM | POA: Insufficient documentation

## 2013-09-07 DIAGNOSIS — R0982 Postnasal drip: Secondary | ICD-10-CM | POA: Insufficient documentation

## 2013-09-07 LAB — CBC
HCT: 32 % — ABNORMAL LOW (ref 39.0–52.0)
HEMOGLOBIN: 9.9 g/dL — AB (ref 13.0–17.0)
MCH: 25.5 pg — ABNORMAL LOW (ref 26.0–34.0)
MCHC: 30.9 g/dL (ref 30.0–36.0)
MCV: 82.5 fL (ref 78.0–100.0)
Platelets: 229 10*3/uL (ref 150–400)
RBC: 3.88 MIL/uL — AB (ref 4.22–5.81)
RDW: 17.6 % — ABNORMAL HIGH (ref 11.5–15.5)
WBC: 7.6 10*3/uL (ref 4.0–10.5)

## 2013-09-07 LAB — BASIC METABOLIC PANEL
BUN: 22 mg/dL (ref 6–23)
CO2: 24 meq/L (ref 19–32)
Calcium: 9.1 mg/dL (ref 8.4–10.5)
Chloride: 98 mEq/L (ref 96–112)
Creatinine, Ser: 7.47 mg/dL — ABNORMAL HIGH (ref 0.50–1.35)
GFR calc Af Amer: 9 mL/min — ABNORMAL LOW (ref >90)
GFR calc non Af Amer: 8 mL/min — ABNORMAL LOW (ref >90)
GLUCOSE: 100 mg/dL — AB (ref 70–99)
POTASSIUM: 3.7 meq/L (ref 3.7–5.3)
SODIUM: 136 meq/L — AB (ref 137–147)

## 2013-09-07 MED ORDER — BENZONATATE 100 MG PO CAPS
200.0000 mg | ORAL_CAPSULE | Freq: Once | ORAL | Status: AC
  Administered 2013-09-07: 200 mg via ORAL
  Filled 2013-09-07: qty 2

## 2013-09-07 MED ORDER — BENZONATATE 100 MG PO CAPS: 200.0000 mg | ORAL_CAPSULE | Freq: Two times a day (BID) | ORAL | Status: DC | PRN

## 2013-09-07 MED ORDER — CLONIDINE HCL 0.1 MG PO TABS
0.2000 mg | ORAL_TABLET | Freq: Once | ORAL | Status: AC
  Administered 2013-09-07: 0.2 mg via ORAL
  Filled 2013-09-07: qty 2

## 2013-09-07 MED ORDER — LISINOPRIL 10 MG PO TABS
10.0000 mg | ORAL_TABLET | Freq: Once | ORAL | Status: AC
  Administered 2013-09-07: 10 mg via ORAL
  Filled 2013-09-07: qty 1

## 2013-09-07 MED ORDER — TRAMADOL HCL 50 MG PO TABS
50.0000 mg | ORAL_TABLET | Freq: Once | ORAL | Status: DC
  Filled 2013-09-07: qty 1

## 2013-09-07 MED ORDER — AMLODIPINE BESYLATE 5 MG PO TABS
5.0000 mg | ORAL_TABLET | Freq: Once | ORAL | Status: AC
  Administered 2013-09-07: 5 mg via ORAL
  Filled 2013-09-07: qty 1

## 2013-09-07 MED ORDER — LABETALOL HCL 200 MG PO TABS
200.0000 mg | ORAL_TABLET | Freq: Once | ORAL | Status: AC
  Administered 2013-09-07: 200 mg via ORAL
  Filled 2013-09-07: qty 1

## 2013-09-07 NOTE — ED Notes (Signed)
Pt c/o "cold symptoms" x "a couple weeks," hypertension x 3-4 days, and headache starting this morning.  Pt reports taking Mucinex for "cold."  Pt sts he has been taking HTN medication as prescribed.

## 2013-09-07 NOTE — ED Provider Notes (Signed)
CSN: [id number]     Arrival date & time 09/07/13  1753 History   First MD Initiated Contact with Patient 09/07/13 1837     Chief Complaint  Patient presents with  . Hypertension  . Headache     (Consider location/radiation/quality/duration/timing/severity/associated sxs/prior Treatment) HPI Comments: Frank [name] is a 48 year-old male with a past medical history of CKD, HTN, currently on hemodialysiss, presenting the Emergency Department with a chief complaint of frontal Headache since this morning.  He describes the discomfort as throbbing pain.  He reports his blood pressure have also been elevated from his baseline for several days.  The patient also reports a productive cough for 3 weeks and nasal congestion.  The patient reports he has been unable to sleep for the past two nights due to the cough.  He reports he was dialyzed today for a full 4 hours and was dialyzed previously on Saturday.  The patient reports taking Mucinex DM 4 days ago.   Patient is a 48 y.o. male presenting with hypertension and headaches. The history is provided by the patient and medical records. No language interpreter was used.  Hypertension Associated symptoms include coughing and headaches. Pertinent negatives include no abdominal pain, chest pain, chills, fever, nausea, rash or vomiting.  Headache Associated symptoms: cough and drainage   Associated symptoms: no abdominal pain, no diarrhea, no fever, no nausea, no photophobia and no vomiting     Past Medical History  Diagnosis Date  . Hypertension   . ESRD (end stage renal disease)     due to HTN per patient, followed at [facility], s/p failed kidney transplant  . Depression   . Complication of anesthesia     itching, sore throat  . Diabetes mellitus without complication     No history per patient, but remains under history as A1c would not be accurate given on dialysis   Past Surgical History  Procedure Laterality Date  . Kidney receipient  2006     failed and started HD in March 2014  . Capd insertion    . Capd removal     History reviewed. No pertinent family history. History  Substance Use Topics  . Smoking status: Former Smoker -- 1.00 packs/day for 1 years    Types: Cigarettes  . Smokeless tobacco: Never Used     Comment: quit Jan 2014  . Alcohol Use: No    Review of Systems  Constitutional: Negative for fever and chills.  HENT: Positive for postnasal drip and rhinorrhea.   Eyes: Negative for photophobia and visual disturbance.  Respiratory: Positive for cough. Negative for shortness of breath and wheezing.   Cardiovascular: Negative for chest pain.  Gastrointestinal: Negative for nausea, vomiting, abdominal pain and diarrhea.  Skin: Negative for rash.  Neurological: Positive for headaches.      Allergies  Darvocet and Ferrlecit  Home Medications   Current Outpatient Rx  Name  Route  Sig  Dispense  Refill  . allopurinol (ZYLOPRIM) 100 MG tablet   Oral   Take 1 tablet (100 mg total) by mouth daily.   30 tablet   6   . amLODipine (NORVASC) 5 MG tablet   Oral   Take 10 mg by mouth daily.         . cinacalcet (SENSIPAR) 30 MG tablet   Oral   Take 30 mg by mouth daily.         . cloNIDine (CATAPRES) 0.1 MG tablet   Oral   Take 0.1  mg by mouth 2 (two) times daily.         . colchicine 0.6 MG tablet   Oral   Take 1 tablet (0.6 mg total) by mouth daily.   30 tablet   3   . furosemide (LASIX) 80 MG tablet   Oral   Take 80 mg by mouth daily.         [name] labetalol (NORMODYNE) 200 MG tablet   Oral   Take 200 mg by mouth 3 (three) times daily.         [name] lisinopril (PRINIVIL,ZESTRIL) 10 MG tablet   Oral   Take 10 mg by mouth daily.         . meloxicam (MOBIC) 7.5 MG tablet   Oral   Take 1 tablet (7.5 mg total) by mouth daily.   30 tablet   0   . mycophenolate (CELLCEPT) 500 MG tablet   Oral   Take 500 mg by mouth 2 (two) times daily.         [name] omeprazole (PRILOSEC) 20 MG  capsule   Oral   Take 20 mg by mouth daily.         [name] oxyCODONE-acetaminophen (PERCOCET) 10-325 MG per tablet   Oral   Take 1 tablet by mouth every 4 (four) hours as needed for pain.         . predniSONE (DELTASONE) 20 MG tablet   Oral   Take 20 mg by mouth daily with breakfast.         . sevelamer carbonate (RENVELA) 800 MG tablet   Oral   Take 800 mg by mouth 4 (four) times daily - after meals and at bedtime.         . sulfamethoxazole-trimethoprim (BACTRIM,SEPTRA) 400-80 MG per tablet   Oral   Take 1 tablet by mouth every Monday, Wednesday, and Friday.         . tacrolimus (PROGRAF) 5 MG capsule   Oral   Take 10 mg by mouth 2 (two) times daily.          . traMADol (ULTRAM) 50 MG tablet   Oral   Take 50 mg by mouth every 6 (six) hours as needed (pain).         . valGANciclovir (VALCYTE) 450 MG tablet   Oral   Take 450 mg by mouth every Monday, Wednesday, and Friday.         . benzonatate (TESSALON) 100 MG capsule   Oral   Take 2 capsules (200 mg total) by mouth 2 (two) times daily as needed for cough.   20 capsule   0    BP 165/115  Pulse 81  Temp(Src) 98.6 F (37 C) (Oral)  Resp 29  SpO2 96% Physical Exam  Nursing note and vitals reviewed. Constitutional: He is oriented to person, place, and time. He appears well-developed and well-nourished. He does not have a sickly appearance. No distress.  HENT:  Head: Normocephalic and atraumatic.  Nose: Rhinorrhea present. Right sinus exhibits no maxillary sinus tenderness and no frontal sinus tenderness. Left sinus exhibits no maxillary sinus tenderness and no frontal sinus tenderness.  Mouth/Throat: Uvula is midline. No trismus in the jaw. Posterior oropharyngeal erythema present. No oropharyngeal exudate.  Eyes: EOM are normal. Pupils are equal, round, and reactive to light.  Neck: Neck supple.  Cardiovascular: Normal rate and regular rhythm.   Pulses:      Radial pulses are 2+ on the right side, and  2+ on  the left side.  Pulmonary/Chest: Effort normal and breath sounds normal. He has no wheezes. He has no rales.  Abdominal: Soft. Normal appearance. There is no tenderness. There is no rigidity, no rebound and no guarding.  Lymphadenopathy:       Head (right side): No submental, no submandibular, no tonsillar, no preauricular, no posterior auricular and no occipital adenopathy present.       Head (left side): No submental, no submandibular, no tonsillar, no preauricular, no posterior auricular and no occipital adenopathy present.    He has no cervical adenopathy.  Neurological: He is alert and oriented to person, place, and time. He has normal strength. No cranial nerve deficit or sensory deficit. He exhibits normal muscle tone. Coordination and gait normal. GCS eye subscore is 4. GCS verbal subscore is 5. GCS motor subscore is 6.  CN 2-12 grossly intact  Skin: Skin is warm and dry. He is not diaphoretic.  Psychiatric: He has a normal mood and affect. His behavior is normal. Thought content normal.    ED Course  Procedures (including critical care time) Labs Review Labs Reviewed  CBC - Abnormal; Notable for the following:    RBC 3.88 (*)    Hemoglobin 9.9 (*)    HCT 32.0 (*)    MCH 25.5 (*)    RDW 17.6 (*)    All other components within normal limits  BASIC METABOLIC PANEL - Abnormal; Notable for the following:    Sodium 136 (*)    Glucose, Bld 100 (*)    Creatinine, Ser 7.47 (*)    GFR calc non Af Amer 8 (*)    GFR calc Af Amer 9 (*)    All other components within normal limits   Imaging Review DG Chest 2 View (Final result)  Result time: 09/07/13 19:26:23    Final result by Rad Results In Interface (09/07/13 19:26:23)    Narrative:   CLINICAL DATA: Cold symptoms.  EXAM: CHEST 2 VIEW  COMPARISON: Chest x-ray 07/16/2013.  FINDINGS: Lung volumes are normal. No consolidative airspace disease. Trace bilateral pleural effusions. No evidence of pulmonary edema.  Heart size is mildly enlarged. Upper mediastinal contours are within normal limits. Right internal jugular PermCath with tip terminating in the superior aspect of the right atrium.  IMPRESSION: 1. Trace bilateral pleural effusions. 2. Mild cardiomegaly.   Electronically Signed By: Vinnie Langton M.D. On: 09/07/2013 19:26          EKG Interpretation    Date/Time:  Tuesday September 07 2013 18:28:32 EST Ventricular Rate:  86 PR Interval:  166 QRS Duration: 83 QT Interval:  471 QTC Calculation: 563 R Axis:   -30 Text Interpretation:  Sinus rhythm Abnormal R-wave progression, early transition LVH with secondary repolarization abnormality Anterior Q waves, possibly due to LVH Prolonged QT interval Baseline wander in lead(s) V3 ED PHYSICIAN INTERPRETATION AVAILABLE IN [facility] Confirmed by TEST, RECORD ([id number]) on 09/09/2013 10:19:50 AM            MDM   Final diagnoses:  Hypertension  Headache  CKD (chronic kidney disease)   Pt with a headache for several days.  Initial BP 189/132.  Labs ordered.  Productive cough for several week will XR to evaluate for possible pneumonia, likely viral URI. No neuro deficits on exam. XR shows trace bilateral pleural effusions and mild cardiomegaly, negative for pneumonia. Will order home BP medication. Re-eval: patient reports partial symptom improvement with rest. CBC shows HGB 9.9- anemia stable.  Cr 7.47, history of  CKD. Re-eval; patient reports resolution of his Headache, He states he was able to sleep for about an hour and it helped with his symptoms. BP 163/115. Discussed lab results, imaging results, and treatment plan with the patient. Return precautions given. Reports understanding and no other concerns at this time.  Patient is stable for discharge at this time.   Meds given in ED:  Medications  cloNIDine (CATAPRES) tablet 0.2 mg (0.2 mg Oral Given 09/07/13 1947)  labetalol (NORMODYNE) tablet 200 mg (200 mg Oral  Given 09/07/13 1948)  lisinopril (PRINIVIL,ZESTRIL) tablet 10 mg (10 mg Oral Given 09/07/13 1947)  amLODipine (NORVASC) tablet 5 mg (5 mg Oral Given 09/07/13 1948)  benzonatate (TESSALON) capsule 200 mg (200 mg Oral Given 09/07/13 2118)    Discharge Medication List as of 09/07/2013 10:00 PM    START taking these medications   Details  benzonatate (TESSALON) 100 MG capsule Take 2 capsules (200 mg total) by mouth 2 (two) times daily as needed for cough., Starting 09/07/2013, Until Discontinued, Print          Lorrine Kin, PA-C 09/10/13 1236

## 2013-09-07 NOTE — ED Notes (Signed)
Patient returned from X-ray 

## 2013-09-07 NOTE — Discharge Instructions (Signed)
Call for a follow up appointment with Dr. Lemar Livings, or a Family or Primary Care Provider for further evaluation of your blood pressure and your headache. Return if Symptoms worsen.   Take medication as prescribed.  Stop taking Mucinex DM

## 2013-09-07 NOTE — ED Notes (Signed)
PA at bedside.

## 2013-09-11 ENCOUNTER — Encounter (HOSPITAL_COMMUNITY): Payer: Self-pay | Admitting: Emergency Medicine

## 2013-09-11 ENCOUNTER — Emergency Department (HOSPITAL_COMMUNITY): Payer: Medicare Other

## 2013-09-11 ENCOUNTER — Observation Stay (HOSPITAL_COMMUNITY)
Admission: EM | Admit: 2013-09-11 | Discharge: 2013-09-12 | Disposition: A | Discharge status: Home / Self Care | Payer: Medicare Other | Attending: Internal Medicine | Admitting: Internal Medicine

## 2013-09-11 DIAGNOSIS — R079 Chest pain, unspecified: Principal | ICD-10-CM | POA: Diagnosis present

## 2013-09-11 DIAGNOSIS — D631 Anemia in chronic kidney disease: Secondary | ICD-10-CM

## 2013-09-11 DIAGNOSIS — D638 Anemia in other chronic diseases classified elsewhere: Secondary | ICD-10-CM | POA: Insufficient documentation

## 2013-09-11 DIAGNOSIS — Z79899 Other long term (current) drug therapy: Secondary | ICD-10-CM | POA: Insufficient documentation

## 2013-09-11 DIAGNOSIS — N2581 Secondary hyperparathyroidism of renal origin: Secondary | ICD-10-CM | POA: Diagnosis present

## 2013-09-11 DIAGNOSIS — R0602 Shortness of breath: Secondary | ICD-10-CM | POA: Insufficient documentation

## 2013-09-11 DIAGNOSIS — N186 End stage renal disease: Secondary | ICD-10-CM

## 2013-09-11 DIAGNOSIS — D899 Disorder involving the immune mechanism, unspecified: Secondary | ICD-10-CM | POA: Insufficient documentation

## 2013-09-11 DIAGNOSIS — N039 Chronic nephritic syndrome with unspecified morphologic changes: Secondary | ICD-10-CM

## 2013-09-11 DIAGNOSIS — I10 Essential (primary) hypertension: Secondary | ICD-10-CM

## 2013-09-11 DIAGNOSIS — I120 Hypertensive chronic kidney disease with stage 5 chronic kidney disease or end stage renal disease: Secondary | ICD-10-CM | POA: Insufficient documentation

## 2013-09-11 DIAGNOSIS — N179 Acute kidney failure, unspecified: Secondary | ICD-10-CM

## 2013-09-11 DIAGNOSIS — Z992 Dependence on renal dialysis: Secondary | ICD-10-CM | POA: Insufficient documentation

## 2013-09-11 DIAGNOSIS — N189 Chronic kidney disease, unspecified: Secondary | ICD-10-CM

## 2013-09-11 DIAGNOSIS — M109 Gout, unspecified: Secondary | ICD-10-CM | POA: Diagnosis present

## 2013-09-11 DIAGNOSIS — G8929 Other chronic pain: Secondary | ICD-10-CM

## 2013-09-11 DIAGNOSIS — R110 Nausea: Secondary | ICD-10-CM | POA: Insufficient documentation

## 2013-09-11 DIAGNOSIS — I517 Cardiomegaly: Secondary | ICD-10-CM | POA: Insufficient documentation

## 2013-09-11 DIAGNOSIS — J90 Pleural effusion, not elsewhere classified: Secondary | ICD-10-CM | POA: Insufficient documentation

## 2013-09-11 LAB — BASIC METABOLIC PANEL
BUN: 36 mg/dL — ABNORMAL HIGH (ref 6–23)
CHLORIDE: 99 meq/L (ref 96–112)
CO2: 26 mEq/L (ref 19–32)
CREATININE: 9.11 mg/dL — AB (ref 0.50–1.35)
Calcium: 9 mg/dL (ref 8.4–10.5)
GFR calc Af Amer: 7 mL/min — ABNORMAL LOW (ref >90)
GFR calc non Af Amer: 6 mL/min — ABNORMAL LOW (ref >90)
Glucose, Bld: 103 mg/dL — ABNORMAL HIGH (ref 70–99)
Potassium: 4.1 mEq/L (ref 3.7–5.3)
Sodium: 140 mEq/L (ref 137–147)

## 2013-09-11 LAB — TROPONIN I

## 2013-09-11 LAB — CBC
HCT: 31.8 % — ABNORMAL LOW (ref 39.0–52.0)
Hemoglobin: 9.6 g/dL — ABNORMAL LOW (ref 13.0–17.0)
MCH: 24.7 pg — AB (ref 26.0–34.0)
MCHC: 30.2 g/dL (ref 30.0–36.0)
MCV: 82 fL (ref 78.0–100.0)
Platelets: 239 10*3/uL (ref 150–400)
RBC: 3.88 MIL/uL — AB (ref 4.22–5.81)
RDW: 17.7 % — ABNORMAL HIGH (ref 11.5–15.5)
WBC: 5.3 10*3/uL (ref 4.0–10.5)

## 2013-09-11 LAB — I-STAT CG4 LACTIC ACID, ED: LACTIC ACID, VENOUS: 1.26 mmol/L (ref 0.5–2.2)

## 2013-09-11 NOTE — ED Notes (Signed)
Pt states chest pain starting this morning, left sided chest pain, does not increase with moving or coughing, states SOB, little nausea, pt had dialysis this morning, did not have chest pain at that time, pain comes and goes, SOB continuous

## 2013-09-11 NOTE — ED Provider Notes (Signed)
CSN: [id number]     Arrival date & time 09/11/13  2023 History   First MD Initiated Contact with Patient 09/11/13 2054     Chief Complaint  Patient presents with  . Chest Pain  . Shortness of Breath     (Consider location/radiation/quality/duration/timing/severity/associated sxs/prior Treatment) HPI Comments: Patient here with chest pain and shortness of breath Chest pain: Chest pain began today. Started on the right side and described as throbbing. It then moved over to the left, where it is described as throbbing and mild pressure. He had it during dialysis today. He did receive full treatment dialysis. Denies any alleviating or exacerbating factors. Happens at rest and is self-limited. Has had it multiple times of the day. Chest pain-free at this time. Never had a chest this before. No history of coronary artery disease or other heart issue Shortness of breath: Happens at rest. Had first episode of this 5 days ago. He came to the hospital to get evaluated and missed dialysis that day. He was let go and had dialysis in the morning of the next day. He then had dialysis the following day and then again today, which is 2 days after the second in a row. Today is Saturday. This past week he had dialysis Wed, Thurs, Sat. He is normally a Tues/Thurs/Sat dialysis patient. Shortness of breath described as "gangrenous air". It is not correlated with chest pain. He is able to lie flat without difficulty. He is not having at this time. He states he feels like he can't get here and will start to be sold to his nose and then will go away. He status post constantly. He denies any anxiety. As stated previously, he had full dialysis treatments 3 days this past week. He had that during her dialysis day and he gave him oxygen which helped. He was scheduled to have dialysis in 2 days, on Monday, to help pull off extra fluid. Patient is followed and [facility] nephrologist Dr. Toni Arthurs.   Patient is a 48 y.o.  male presenting with chest pain and shortness of breath. The history is provided by the patient.  Chest Pain Pain location:  L chest Pain quality: pressure and throbbing   Pain radiates to:  Does not radiate Pain radiates to the back: no   Pain severity:  Mild Onset quality:  Sudden Timing:  Intermittent Progression:  Worsening Chronicity:  New Context: at rest   Context: not breathing and not eating   Relieved by:  Nothing Worsened by:  Nothing tried Associated symptoms: lower extremity edema (mild) and shortness of breath   Associated symptoms: no altered mental status, no back pain, no fever, no syncope and no weakness   Shortness of Breath Associated symptoms: chest pain   Associated symptoms: no fever and no syncope     Past Medical History  Diagnosis Date  . Hypertension   . ESRD (end stage renal disease)     due to HTN per patient, followed at [facility], s/p failed kidney transplant  . Depression   . Complication of anesthesia     itching, sore throat  . Diabetes mellitus without complication     No history per patient, but remains under history as A1c would not be accurate given on dialysis   Past Surgical History  Procedure Laterality Date  . Kidney receipient  2006    failed and started HD in March 2014  . Capd insertion    . Capd removal     History reviewed.  No pertinent family history. History  Substance Use Topics  . Smoking status: Former Smoker -- 1.00 packs/day for 1 years    Types: Cigarettes  . Smokeless tobacco: Never Used     Comment: quit Jan 2014  . Alcohol Use: No    Review of Systems  Constitutional: Negative for fever and chills.  Respiratory: Positive for shortness of breath.   Cardiovascular: Positive for chest pain. Negative for syncope.  Musculoskeletal: Negative for back pain.  Neurological: Negative for weakness.  All other systems reviewed and are negative.      Allergies  Ferrlecit and Darvocet  Home Medications    Current Outpatient Rx  Name  Route  Sig  Dispense  Refill  . allopurinol (ZYLOPRIM) 100 MG tablet   Oral   Take 1 tablet (100 mg total) by mouth daily.   30 tablet   6   . amLODipine (NORVASC) 5 MG tablet   Oral   Take 10 mg by mouth daily.         . benzonatate (TESSALON) 100 MG capsule   Oral   Take 2 capsules (200 mg total) by mouth 2 (two) times daily as needed for cough.   20 capsule   0   . cinacalcet (SENSIPAR) 30 MG tablet   Oral   Take 30 mg by mouth daily.         . cloNIDine (CATAPRES) 0.1 MG tablet   Oral   Take 0.2 mg by mouth 2 (two) times daily.          . colchicine 0.6 MG tablet   Oral   Take 0.6 mg by mouth daily as needed (for gout).         . furosemide (LASIX) 80 MG tablet   Oral   Take 80 mg by mouth daily.         [name] labetalol (NORMODYNE) 200 MG tablet   Oral   Take 200 mg by mouth 3 (three) times daily.         [name] lisinopril (PRINIVIL,ZESTRIL) 10 MG tablet   Oral   Take 10 mg by mouth daily.         . meloxicam (MOBIC) 7.5 MG tablet   Oral   Take 1 tablet (7.5 mg total) by mouth daily.   30 tablet   0   . mycophenolate (CELLCEPT) 500 MG tablet   Oral   Take 500 mg by mouth 2 (two) times daily.         [name] omeprazole (PRILOSEC) 20 MG capsule   Oral   Take 20 mg by mouth daily.         [name] oxyCODONE-acetaminophen (PERCOCET) 10-325 MG per tablet   Oral   Take 1 tablet by mouth every 4 (four) hours as needed for pain.         . predniSONE (DELTASONE) 20 MG tablet   Oral   Take 20 mg by mouth daily with breakfast.         . sevelamer carbonate (RENVELA) 800 MG tablet   Oral   Take 800 mg by mouth 4 (four) times daily - after meals and at bedtime.         . sulfamethoxazole-trimethoprim (BACTRIM,SEPTRA) 400-80 MG per tablet   Oral   Take 1 tablet by mouth every Monday, Wednesday, and Friday.         . tacrolimus (PROGRAF) 5 MG capsule   Oral   Take 10 mg by mouth 2 (two) times  daily.          .  valGANciclovir (VALCYTE) 450 MG tablet   Oral   Take 450 mg by mouth every Monday, Wednesday, and Friday.          BP 167/128  Pulse 82  Resp 14  SpO2 98% Physical Exam  Constitutional: He is oriented to person, place, and time. He appears well-developed and well-nourished. No distress.  HENT:  Head: Normocephalic and atraumatic.  Mouth/Throat: No oropharyngeal exudate.  Eyes: EOM are normal. Pupils are equal, round, and reactive to light.  Neck: Normal range of motion. Neck supple.  Cardiovascular: Normal rate and regular rhythm.  Exam reveals no friction rub.   No murmur heard. Pulmonary/Chest: Effort normal and breath sounds normal. No respiratory distress. He has no wheezes. He has no rales.  Abdominal: Soft. He exhibits no distension. There is no tenderness. There is no rebound.  Musculoskeletal: Normal range of motion. He exhibits edema (1+ in lower legs bilterally).  Neurological: He is alert and oriented to person, place, and time.  Skin: He is not diaphoretic.    ED Course  Procedures (including critical care time) Labs Review Labs Reviewed - No data to display Imaging Review No results found.  EKG Interpretation   None      Date: 09/12/2013  Rate: 83  Rhythm: normal sinus rhythm  QRS Axis: normal  Intervals: normal  ST/T Wave abnormalities: nonspecific T wave changes  Conduction Disutrbances:none  Narrative Interpretation:   Old EKG Reviewed: unchanged    MDM   Final diagnoses:  Chest pain  Acute-on-chronic renal failure  Anemia of chronic kidney failure   6M with hx of ESRD on Dialysis presents with SOB and CP. Recent cold for past several weeks with productive cough. CXR a few days ago with mild bilateral pleural effusions, no pneumonia. Patient here speaking in complete sentences, no CP or SOB at this time. Lying flat comfortably. No rales on exam, trace peripheral edema, no concern for acute volume overload. Had 3 full dialysis treatments this  week. Does dialysis in [facility] with [facility]. Patient here with L sided intermittent pressure like chest pain also. Patient without pain here.  Initial trop normal. CXR without overt pulmonary edema. No need for acute dialysis tonight. Admitted for ACS r/o.  Osvaldo Shipper, MD 09/12/13 [phone]

## 2013-09-11 NOTE — H&P (Signed)
[facility] History and Physical  NEFTALI ABAIR AOZ:[id number] DOB: 1965/12/29 DOA: 09/11/2013  Referring physician: ED physician PCP: Adin Hector   Chief Complaint: chest pain   HPI:  Pt is 48 yo male with ESRD on HD in [place] [facility], HTN, anemia of chronic disease, who presented to [facility] ED with main concern of sudden onset of mid sternal area chest pain, intermittent and throbbing, 5/10 in severity and resolving after 30 minutes spontaneously, no specific aggravating or alleviating factors, no similar events in the past. Pt denies any specific radiating symptoms, no specific associated symptoms such as N/V, cough, shortness of breath. Pt has had several episodes throughout the day and currently denies chest pain. He has attended all 3 HD session this past week with no specific problems. Patient is followed and [facility] nephrologist Dr. Toni Arthurs.   In ED, pt is hemodynamically stable, denies chest pain. TRH asked to admit for observation of chest pain, ACS rule out. Telemetry bed requested.   Assessment and Plan: Active Problems:   Chest pain - admit to telemetry bed - cycle CE's, check 12 lead EKG, UDS - continue analgesia and oxygen as needed   ESLD - HD in [place] [facility] - will plan on discharging prior to his next session - if pt stays here longer, will need transfer to [facility] for HD   Immunocompromised immunity - on immunosuppressants - will continue here    Anemia of chronic disease - no signs of active bleeding - CBC in AM   HTN - continue Norvasc, Labetalol, Lisinopril, Lasix   Code Status: Full Family Communication: Pt at bedside Disposition Plan: Admit to telemetry bed   Review of Systems:  Constitutional: Negative for diaphoresis.  HENT: Negative for hearing loss, ear pain, nosebleeds, congestion, sore throat, neck pain, tinnitus and ear discharge.   Eyes: Negative for blurred vision, double vision, photophobia, pain, discharge and redness.   Respiratory: Negative for wheezing and stridor.   Cardiovascular: Negative for claudication and leg swelling.  Gastrointestinal: Negative for heartburn, constipation, blood in stool and melena.  Genitourinary: Negative for dysuria, urgency, frequency, hematuria and flank pain.  Musculoskeletal: Negative for myalgias, back pain, joint pain and falls.  Skin: Negative for itching and rash.  Neurological: Negative for tingling, tremors, sensory change, speech change, focal weakness, loss of consciousness and headaches.  Endo/Heme/Allergies: Negative for environmental allergies and polydipsia. Does not bruise/bleed easily.  Psychiatric/Behavioral: Negative for suicidal ideas. The patient is not nervous/anxious.      Past Medical History  Diagnosis Date  . Hypertension   . ESRD (end stage renal disease)     due to HTN per patient, followed at [facility], s/p failed kidney transplant  . Depression   . Complication of anesthesia     itching, sore throat  . Diabetes mellitus without complication     No history per patient, but remains under history as A1c would not be accurate given on dialysis    Past Surgical History  Procedure Laterality Date  . Kidney receipient  2006    failed and started HD in March 2014  . Capd insertion    . Capd removal      Social History:  reports that he has quit smoking. His smoking use included Cigarettes. He has a 1 pack-year smoking history. He has never used smokeless tobacco. He reports that he does not drink alcohol or use illicit drugs.  Allergies  Allergen Reactions  . Ferrlecit [Na Ferric Gluc Cplx In  Sucrose] Shortness Of Breath, Swelling and Other (See Comments)    Swelling in throat  . Darvocet [Propoxyphene N-Acetaminophen] Hives    No known family medical history   Prior to Admission medications   Medication Sig Start Date End Date Taking? Authorizing Provider  allopurinol (ZYLOPRIM) 100 MG tablet Take 1 tablet (100 mg total) by mouth  daily. 04/21/13  Yes [name], MD  amLODipine (NORVASC) 5 MG tablet Take 10 mg by mouth daily.   Yes Historical Provider, MD  benzonatate (TESSALON) 100 MG capsule Take 2 capsules (200 mg total) by mouth 2 (two) times daily as needed for cough. 09/07/13  Yes Lauren Burnetta Sabin, PA-C  cinacalcet (SENSIPAR) 30 MG tablet Take 30 mg by mouth daily.   Yes Historical Provider, MD  cloNIDine (CATAPRES) 0.1 MG tablet Take 0.2 mg by mouth 2 (two) times daily.    Yes Historical Provider, MD  colchicine 0.6 MG tablet Take 0.6 mg by mouth daily as needed (for gout). 04/13/13  Yes [name], MD  furosemide (LASIX) 80 MG tablet Take 80 mg by mouth daily.   Yes Historical Provider, MD  labetalol (NORMODYNE) 200 MG tablet Take 200 mg by mouth 3 (three) times daily.   Yes Historical Provider, MD  lisinopril (PRINIVIL,ZESTRIL) 10 MG tablet Take 10 mg by mouth daily. 05/11/13  Yes Historical Provider, MD  meloxicam (MOBIC) 7.5 MG tablet Take 1 tablet (7.5 mg total) by mouth daily. 04/13/13  Yes [name], MD  mycophenolate (CELLCEPT) 500 MG tablet Take 500 mg by mouth 2 (two) times daily.   Yes Historical Provider, MD  omeprazole (PRILOSEC) 20 MG capsule Take 20 mg by mouth daily. 07/01/13  Yes Historical Provider, MD  oxyCODONE-acetaminophen (PERCOCET) 10-325 MG per tablet Take 1 tablet by mouth every 4 (four) hours as needed for pain.   Yes Historical Provider, MD  predniSONE (DELTASONE) 20 MG tablet Take 20 mg by mouth daily with breakfast.   Yes Historical Provider, MD  sevelamer carbonate (RENVELA) 800 MG tablet Take 800 mg by mouth 4 (four) times daily - after meals and at bedtime.   Yes Historical Provider, MD  sulfamethoxazole-trimethoprim (BACTRIM,SEPTRA) 400-80 MG per tablet Take 1 tablet by mouth every Monday, Wednesday, and Friday. 07/01/13  Yes Historical Provider, MD  tacrolimus (PROGRAF) 5 MG capsule Take 10 mg by mouth 2 (two) times daily.    Yes Historical Provider, MD  valGANciclovir  (VALCYTE) 450 MG tablet Take 450 mg by mouth every Monday, Wednesday, and Friday.   Yes Historical Provider, MD    Physical Exam: Filed Vitals:   09/11/13 2037  BP: 167/128  Pulse: 82  Resp: 14  SpO2: 98%    Physical Exam  Constitutional: Appears well-developed and well-nourished. No distress.  HENT: Normocephalic. External right and left ear normal. Oropharynx is clear and moist.  Eyes: Conjunctivae and EOM are normal. PERRLA, no scleral icterus.  Neck: Normal ROM. Neck supple. No JVD. No tracheal deviation. No thyromegaly.  CVS: RRR, S1/S2 +, no murmurs, no gallops, no carotid bruit.  Pulmonary: Effort and breath sounds normal, no stridor, rhonchi, diminished air movement at bases  Abdominal: Soft. BS +,  no distension, tenderness, rebound or guarding.  Musculoskeletal: Normal range of motion. +1 bilateral LE pitting edema, no tenderness.  Lymphadenopathy: No lymphadenopathy noted, cervical, inguinal. Neuro: Alert. Normal reflexes, muscle tone coordination. No cranial nerve deficit. Skin: Skin is warm and dry. No rash noted. Not diaphoretic. No erythema. No pallor.  Psychiatric: Normal  mood and affect. Behavior, judgment, thought content normal.   Labs on Admission:  Basic Metabolic Panel:  Recent Labs Lab 09/07/13 1900 09/11/13 2130  NA 136* 140  K 3.7 4.1  CL 98 99  CO2 24 26  GLUCOSE 100* 103*  BUN 22 36*  CREATININE 7.47* 9.11*  CALCIUM 9.1 9.0   CBC:  Recent Labs Lab 09/07/13 1900 09/11/13 2130  WBC 7.6 5.3  HGB 9.9* 9.6*  HCT 32.0* 31.8*  MCV 82.5 82.0  PLT 229 239   Cardiac Enzymes:  Recent Labs Lab 09/11/13 2130  TROPONINI <0.30   Radiological Exams on Admission: Dg Chest 2 View  09/11/2013   CLINICAL DATA:  Left-sided chest pain, shortness of breath and mild nausea. History of smoking.  EXAM: CHEST  2 VIEW  COMPARISON:  Chest radiograph performed 09/07/2013  FINDINGS: The lungs are well-aerated. A small left pleural effusion is again seen.  Mild vascular congestion is noted, without significant pulmonary edema. There is no evidence of pneumothorax.  The heart is mildly enlarged. A right-sided dual-lumen catheter is seen ending within the right atrium. No acute osseous abnormalities are seen.  IMPRESSION: Small left pleural effusion again seen. Vascular congestion, without significant pulmonary edema. Mild cardiomegaly noted.   Electronically Signed   By: Garald Balding M.D.   On: 09/11/2013 22:27    EKG: Normal sinus rhythm, no ST/T wave changes  Faye Ramsay, MD  [facility] Pager [phone]  If 7PM-7AM, please contact night-coverage [email or web address] Password [facility] 09/11/2013, 11:43 PM

## 2013-09-11 NOTE — ED Provider Notes (Signed)
Medical screening examination/treatment/procedure(s) were performed by non-physician practitioner and as supervising physician I was immediately available for consultation/collaboration.  EKG Interpretation    Date/Time:  Tuesday September 07 2013 18:28:32 EST Ventricular Rate:  86 PR Interval:  166 QRS Duration: 83 QT Interval:  471 QTC Calculation: 563 R Axis:   -30 Text Interpretation:  Sinus rhythm Abnormal R-wave progression, early transition LVH with secondary repolarization abnormality Anterior Q waves, possibly due to LVH Prolonged QT interval Baseline wander in lead(s) V3 ED PHYSICIAN INTERPRETATION AVAILABLE IN [facility] Confirmed by TEST, RECORD ([id number]) on 09/09/2013 10:19:50 AM             Virgel Manifold, MD 09/11/13 [phone]

## 2013-09-12 ENCOUNTER — Encounter (HOSPITAL_COMMUNITY): Payer: Self-pay | Admitting: *Deleted

## 2013-09-12 DIAGNOSIS — G8929 Other chronic pain: Secondary | ICD-10-CM

## 2013-09-12 DIAGNOSIS — M109 Gout, unspecified: Secondary | ICD-10-CM

## 2013-09-12 DIAGNOSIS — N186 End stage renal disease: Secondary | ICD-10-CM

## 2013-09-12 DIAGNOSIS — I10 Essential (primary) hypertension: Secondary | ICD-10-CM

## 2013-09-12 LAB — BASIC METABOLIC PANEL
BUN: 38 mg/dL — ABNORMAL HIGH (ref 6–23)
CO2: 26 meq/L (ref 19–32)
Calcium: 9.2 mg/dL (ref 8.4–10.5)
Chloride: 98 mEq/L (ref 96–112)
Creatinine, Ser: 9.62 mg/dL — ABNORMAL HIGH (ref 0.50–1.35)
GFR calc Af Amer: 6 mL/min — ABNORMAL LOW (ref >90)
GFR, EST NON AFRICAN AMERICAN: 6 mL/min — AB (ref >90)
Glucose, Bld: 140 mg/dL — ABNORMAL HIGH (ref 70–99)
Potassium: 4.2 mEq/L (ref 3.7–5.3)
SODIUM: 138 meq/L (ref 137–147)

## 2013-09-12 LAB — GLUCOSE, CAPILLARY
Glucose-Capillary: 142 mg/dL — ABNORMAL HIGH (ref 70–99)
Glucose-Capillary: 141 mg/dL — ABNORMAL HIGH (ref 70–99)
Glucose-Capillary: 173 mg/dL — ABNORMAL HIGH (ref 70–99)

## 2013-09-12 LAB — TROPONIN I: Troponin I: <0.3 ng/mL (ref <0.30)

## 2013-09-12 LAB — CBC
HCT: 30 % — ABNORMAL LOW (ref 39.0–52.0)
Hemoglobin: 9.2 g/dL — ABNORMAL LOW (ref 13.0–17.0)
MCH: 24.9 pg — ABNORMAL LOW (ref 26.0–34.0)
MCHC: 30.7 g/dL (ref 30.0–36.0)
MCV: 81.3 fL (ref 78.0–100.0)
PLATELETS: 231 10*3/uL (ref 150–400)
RBC: 3.69 MIL/uL — AB (ref 4.22–5.81)
RDW: 17.3 % — AB (ref 11.5–15.5)
WBC: 4.6 10*3/uL (ref 4.0–10.5)

## 2013-09-12 MED ORDER — FUROSEMIDE 80 MG PO TABS
80.0000 mg | ORAL_TABLET | Freq: Every day | ORAL | Status: DC
  Administered 2013-09-12: 80 mg via ORAL
  Filled 2013-09-12: qty 1

## 2013-09-12 MED ORDER — VALGANCICLOVIR HCL 450 MG PO TABS: 450.0000 mg | ORAL_TABLET | ORAL | Status: DC

## 2013-09-12 MED ORDER — ALLOPURINOL 100 MG PO TABS
100.0000 mg | ORAL_TABLET | Freq: Every day | ORAL | Status: DC
  Administered 2013-09-12: 100 mg via ORAL
  Filled 2013-09-12: qty 1

## 2013-09-12 MED ORDER — MELOXICAM 7.5 MG PO TABS
7.5000 mg | ORAL_TABLET | Freq: Every day | ORAL | Status: DC
  Administered 2013-09-12: 7.5 mg via ORAL
  Filled 2013-09-12 (×2): qty 1

## 2013-09-12 MED ORDER — SEVELAMER CARBONATE 800 MG PO TABS
800.0000 mg | ORAL_TABLET | Freq: Three times a day (TID) | ORAL | Status: DC
  Administered 2013-09-12 (×3): 800 mg via ORAL
  Filled 2013-09-12 (×5): qty 1

## 2013-09-12 MED ORDER — CINACALCET HCL 30 MG PO TABS
30.0000 mg | ORAL_TABLET | Freq: Every day | ORAL | Status: DC
  Administered 2013-09-12: 30 mg via ORAL
  Filled 2013-09-12 (×2): qty 1

## 2013-09-12 MED ORDER — HYDRALAZINE HCL 25 MG PO TABS
25.0000 mg | ORAL_TABLET | Freq: Three times a day (TID) | ORAL | Status: DC
  Administered 2013-09-12: 25 mg via ORAL
  Filled 2013-09-12 (×3): qty 1

## 2013-09-12 MED ORDER — PREDNISONE 20 MG PO TABS
20.0000 mg | ORAL_TABLET | Freq: Every day | ORAL | Status: DC
  Administered 2013-09-12: 20 mg via ORAL
  Filled 2013-09-12 (×2): qty 1

## 2013-09-12 MED ORDER — BENZONATATE 100 MG PO CAPS
200.0000 mg | ORAL_CAPSULE | Freq: Two times a day (BID) | ORAL | Status: DC | PRN
  Filled 2013-09-12: qty 2

## 2013-09-12 MED ORDER — LISINOPRIL 10 MG PO TABS
10.0000 mg | ORAL_TABLET | Freq: Every day | ORAL | Status: DC
  Administered 2013-09-12 (×2): 10 mg via ORAL
  Filled 2013-09-12 (×2): qty 1

## 2013-09-12 MED ORDER — PANTOPRAZOLE SODIUM 40 MG PO TBEC
40.0000 mg | DELAYED_RELEASE_TABLET | Freq: Every day | ORAL | Status: DC
  Administered 2013-09-12: 40 mg via ORAL
  Filled 2013-09-12: qty 1

## 2013-09-12 MED ORDER — AMLODIPINE BESYLATE 10 MG PO TABS
10.0000 mg | ORAL_TABLET | Freq: Every day | ORAL | Status: DC
  Administered 2013-09-12 (×2): 10 mg via ORAL
  Filled 2013-09-12 (×2): qty 1

## 2013-09-12 MED ORDER — OXYCODONE-ACETAMINOPHEN 5-325 MG PO TABS: 1.0000 | ORAL_TABLET | ORAL | Status: DC | PRN

## 2013-09-12 MED ORDER — INSULIN ASPART 100 UNIT/ML ~~LOC~~ SOLN
0.0000 [IU] | Freq: Three times a day (TID) | SUBCUTANEOUS | Status: DC
  Administered 2013-09-12 (×2): 1 [IU] via SUBCUTANEOUS
  Administered 2013-09-12: 2 [IU] via SUBCUTANEOUS

## 2013-09-12 MED ORDER — COLCHICINE 0.6 MG PO TABS
0.6000 mg | ORAL_TABLET | Freq: Every day | ORAL | Status: DC
  Administered 2013-09-12: 0.6 mg via ORAL
  Filled 2013-09-12: qty 1

## 2013-09-12 MED ORDER — HYDRALAZINE HCL 20 MG/ML IJ SOLN
5.0000 mg | Freq: Four times a day (QID) | INTRAMUSCULAR | Status: DC | PRN
  Administered 2013-09-12: 5 mg via INTRAVENOUS
  Filled 2013-09-12 (×2): qty 0.25

## 2013-09-12 MED ORDER — LABETALOL HCL 200 MG PO TABS
200.0000 mg | ORAL_TABLET | Freq: Three times a day (TID) | ORAL | Status: DC
  Administered 2013-09-12 (×3): 200 mg via ORAL
  Filled 2013-09-12 (×4): qty 1

## 2013-09-12 MED ORDER — HYDROMORPHONE HCL PF 1 MG/ML IJ SOLN: 1.0000 mg | INTRAMUSCULAR | Status: DC | PRN

## 2013-09-12 MED ORDER — MYCOPHENOLATE MOFETIL 250 MG PO CAPS
500.0000 mg | ORAL_CAPSULE | Freq: Two times a day (BID) | ORAL | Status: DC
  Administered 2013-09-12: 500 mg via ORAL
  Filled 2013-09-12 (×2): qty 2

## 2013-09-12 MED ORDER — OXYCODONE HCL 5 MG PO TABS
5.0000 mg | ORAL_TABLET | ORAL | Status: DC | PRN
Start: 2013-09-12 — End: 2013-09-12

## 2013-09-12 MED ORDER — CLONIDINE HCL 0.2 MG PO TABS
0.2000 mg | ORAL_TABLET | Freq: Two times a day (BID) | ORAL | Status: DC
  Administered 2013-09-12 (×2): 0.2 mg via ORAL
  Filled 2013-09-12: qty 1
  Filled 2013-09-12: qty 2
  Filled 2013-09-12: qty 1

## 2013-09-12 MED ORDER — HEPARIN SODIUM (PORCINE) 5000 UNIT/ML IJ SOLN
5000.0000 [IU] | Freq: Three times a day (TID) | INTRAMUSCULAR | Status: DC
  Administered 2013-09-12 (×2): 5000 [IU] via SUBCUTANEOUS
  Filled 2013-09-12 (×4): qty 1

## 2013-09-12 MED ORDER — OXYCODONE-ACETAMINOPHEN 10-325 MG PO TABS: 1.0000 | ORAL_TABLET | ORAL | Status: DC | PRN

## 2013-09-12 MED ORDER — TACROLIMUS 1 MG PO CAPS
10.0000 mg | ORAL_CAPSULE | Freq: Two times a day (BID) | ORAL | Status: DC
  Administered 2013-09-12: 10 mg via ORAL
  Filled 2013-09-12 (×3): qty 10

## 2013-09-12 MED ORDER — COLCHICINE 0.6 MG PO TABS
0.6000 mg | ORAL_TABLET | Freq: Every day | ORAL | Status: DC | PRN
  Filled 2013-09-12: qty 1

## 2013-09-12 MED ORDER — LORAZEPAM 2 MG/ML IJ SOLN
1.0000 mg | Freq: Once | INTRAMUSCULAR | Status: AC
  Administered 2013-09-12: 1 mg via INTRAVENOUS
  Filled 2013-09-12: qty 1

## 2013-09-12 NOTE — Progress Notes (Signed)
Pt refusing to answer admission questions at this time. Will try later.

## 2013-09-12 NOTE — Discharge Summary (Addendum)
Physician Discharge Summary  Frank [name] TMH:[id number] DOB: 13-Jan-1966 DOA: 09/11/2013  PCP: Marcelle Overlie Frank [name]  Admit date: 09/11/2013 Discharge date: 09/12/2013  Time spent: 35 minutes  Recommendations for Outpatient Follow-up:  1. Follow up with scheduled HD 2. Follow up with PCP in 1-2 weeks 3. Would titrate bp meds as tolerated  Discharge Diagnoses:  Principal Problem:   Chest pain Active Problems:   Hypertension   Essential hypertension, malignant   ESRD on dialysis- Tues, Thurs, Sat in [place], nephro: [name]   Secondary hyperparathyroidism (of renal origin)   Gout   Discharge Condition: Stable  Diet recommendation: Renal  Filed Weights   09/12/13 0254  Weight: 77.429 kg (170 lb 11.2 oz)    History of present illness:  Pt is 48 yo male with ESRD on HD in [place] [facility], HTN, anemia of chronic disease, who presented to [facility] ED with main concern of sudden onset of mid sternal area chest pain, intermittent and throbbing, 5/10 in severity and resolving after 30 minutes spontaneously, no specific aggravating or alleviating factors, no similar events in the past. Pt denies any specific radiating symptoms, no specific associated symptoms such as N/V, cough, shortness of breath. Pt has had several episodes throughout the day and currently denies chest pain. He has attended all 3 HD session this past week with no specific problems. Patient is followed and [facility] nephrologist Dr. Toni Arthurs.  In ED, pt is hemodynamically stable, denies chest pain. TRH asked to admit for observation of chest pain, ACS rule out. Telemetry bed requested.   Hospital Course:  Chest pain   - cycle CE neg x 3 - EKG unremarkable on admit as well as the following day - continue analgesia and oxygen as needed  ESLD  - HD in [place] [facility]  Immunocompromised immunity  - on immunosuppressants  - continued  Anemia of chronic disease  - no signs of active bleeding  - hgb stable   HTN   - continued Norvasc, Labetalol, Lisinopril, Lasix  - Poorly controlled, but upon review of pt's previous vitals, appears to be his baseline - Cont meds for now to be titrated as an outpatient  Discharge Exam: Filed Vitals:   09/12/13 1255 09/12/13 1432 09/12/13 1500 09/12/13 1717  BP: 150/115 164/120 171/121 173/115  Pulse: 83 79    Temp:  97.4 F (36.3 C)    TempSrc:  Oral    Resp:  18    Height:      Weight:      SpO2:  100%      General: awake, in nad Cardiovascular: regular,s 1, s2 Respiratory: normal resp effort, no wheezing  Discharge Instructions     Medication List    STOP taking these medications       cloNIDine 0.1 MG tablet  Commonly known as:  CATAPRES      TAKE these medications       allopurinol 100 MG tablet  Commonly known as:  ZYLOPRIM  Take 1 tablet (100 mg total) by mouth daily.     amLODipine 5 MG tablet  Commonly known as:  NORVASC  Take 10 mg by mouth daily.     benzonatate 100 MG capsule  Commonly known as:  TESSALON  Take 2 capsules (200 mg total) by mouth 2 (two) times daily as needed for cough.     cinacalcet 30 MG tablet  Commonly known as:  SENSIPAR  Take 30 mg by mouth daily.  colchicine 0.6 MG tablet  Take 0.6 mg by mouth daily as needed (for gout).     furosemide 80 MG tablet  Commonly known as:  LASIX  Take 80 mg by mouth daily.     labetalol 200 MG tablet  Commonly known as:  NORMODYNE  Take 200 mg by mouth 3 (three) times daily.     lisinopril 10 MG tablet  Commonly known as:  PRINIVIL,ZESTRIL  Take 10 mg by mouth daily.     meloxicam 7.5 MG tablet  Commonly known as:  MOBIC  Take 1 tablet (7.5 mg total) by mouth daily.     mycophenolate 500 MG tablet  Commonly known as:  CELLCEPT  Take 500 mg by mouth 2 (two) times daily.     omeprazole 20 MG capsule  Commonly known as:  PRILOSEC  Take 20 mg by mouth daily.     oxyCODONE-acetaminophen 10-325 MG per tablet  Commonly known as:  PERCOCET  Take 1  tablet by mouth every 4 (four) hours as needed for pain.     predniSONE 20 MG tablet  Commonly known as:  DELTASONE  Take 20 mg by mouth daily with breakfast.     sevelamer carbonate 800 MG tablet  Commonly known as:  RENVELA  Take 800 mg by mouth 4 (four) times daily - after meals and at bedtime.     sulfamethoxazole-trimethoprim 400-80 MG per tablet  Commonly known as:  BACTRIM,SEPTRA  Take 1 tablet by mouth every Monday, Wednesday, and Friday.     tacrolimus 5 MG capsule  Commonly known as:  PROGRAF  Take 10 mg by mouth 2 (two) times daily.     valGANciclovir 450 MG tablet  Commonly known as:  VALCYTE  Take 450 mg by mouth every Monday, Wednesday, and Friday.       Allergies  Allergen Reactions  . Ferrlecit [Na Ferric Gluc Cplx In Sucrose] Shortness Of Breath, Swelling and Other (See Comments)    Swelling in throat  . Darvocet [Propoxyphene N-Acetaminophen] Hives   Follow-up Information   Follow up with Adin Hector. Schedule an appointment as soon as possible for a visit in 1 week.   Specialty:  General Practice       The results of significant diagnostics from this hospitalization (including imaging, microbiology, ancillary and laboratory) are listed below for reference.    Significant Diagnostic Studies: Dg Chest 2 View  09/11/2013   CLINICAL DATA:  Left-sided chest pain, shortness of breath and mild nausea. History of smoking.  EXAM: CHEST  2 VIEW  COMPARISON:  Chest radiograph performed 09/07/2013  FINDINGS: The lungs are well-aerated. A small left pleural effusion is again seen. Mild vascular congestion is noted, without significant pulmonary edema. There is no evidence of pneumothorax.  The heart is mildly enlarged. A right-sided dual-lumen catheter is seen ending within the right atrium. No acute osseous abnormalities are seen.  IMPRESSION: Small left pleural effusion again seen. Vascular congestion, without significant pulmonary edema. Mild cardiomegaly  noted.   Electronically Signed   By: Garald Balding M.D.   On: 09/11/2013 22:27   Dg Chest 2 View  09/07/2013   CLINICAL DATA:  Cold symptoms.  EXAM: CHEST  2 VIEW  COMPARISON:  Chest x-ray 07/16/2013.  FINDINGS: Lung volumes are normal. No consolidative airspace disease. Trace bilateral pleural effusions. No evidence of pulmonary edema. Heart size is mildly enlarged. Upper mediastinal contours are within normal limits. Right internal jugular PermCath with tip terminating in the superior  aspect of the right atrium.  IMPRESSION: 1. Trace bilateral pleural effusions. 2. Mild cardiomegaly.   Electronically Signed   By: Vinnie Langton M.D.   On: 09/07/2013 19:26    Microbiology: No results found for this or any previous visit (from the past 240 hour(s)).   Labs: Basic Metabolic Panel:  Recent Labs Lab 09/07/13 1900 09/11/13 2130 09/12/13 0506  NA 136* 140 138  K 3.7 4.1 4.2  CL 98 99 98  CO2 _0 GLUCOSE 100* 103* 140*  BUN 22 36* 38*  CREATININE 7.47* 9.11* 9.62*  CALCIUM 9.1 9.0 9.2   Liver Function Tests: No results found for this basename: AST, ALT, ALKPHOS, BILITOT, PROT, ALBUMIN,  in the last 168 hours No results found for this basename: LIPASE, AMYLASE,  in the last 168 hours No results found for this basename: AMMONIA,  in the last 168 hours CBC:  Recent Labs Lab 09/07/13 1900 09/11/13 2130 09/12/13 0506  WBC 7.6 5.3 4.6  HGB 9.9* 9.6* 9.2*  HCT 32.0* 31.8* 30.0*  MCV 82.5 82.0 81.3  PLT 229 239 231   Cardiac Enzymes:  Recent Labs Lab 09/11/13 2130 09/12/13 0506 09/12/13 0913 09/12/13 1414  TROPONINI <0.30 <0.30 <0.30 <0.30   BNP: BNP (last 3 results) No results found for this basename: PROBNP,  in the last 8760 hours CBG:  Recent Labs Lab 09/12/13 0719 09/12/13 1146 09/12/13 1631  GLUCAP 142* 141* 173*    Signed:  CHIU, STEPHEN K  [facility] 09/12/2013, 5:28 PM

## 2013-09-12 NOTE — ED Notes (Addendum)
Patient states he did not take his home medications today.

## 2013-09-12 NOTE — Progress Notes (Signed)
Utilization Review Completed.   Nakeisha Greenhouse, RN, BSN Nurse Case Manager  

## 2013-09-12 NOTE — Progress Notes (Signed)
Blood pressure 170s/120s scheduled and PRN blood pressure medication given and blood pressure still 164/120, patient denies any distress, notified Dr. Wyline Copas, no new order given, will continue to assess patient.

## 2013-09-12 NOTE — ED Notes (Signed)
Attempted to call report to nurse on floor.

## 2013-09-16 ENCOUNTER — Emergency Department (HOSPITAL_COMMUNITY): Payer: Medicare Other

## 2013-09-16 ENCOUNTER — Encounter (HOSPITAL_COMMUNITY): Payer: Self-pay | Admitting: Emergency Medicine

## 2013-09-16 ENCOUNTER — Inpatient Hospital Stay (HOSPITAL_COMMUNITY)
Admission: EM | Admit: 2013-09-16 | Discharge: 2013-09-20 | DRG: 682 | Discharge status: Against Medical Advice | Payer: Medicare Other | Attending: Internal Medicine | Admitting: Internal Medicine

## 2013-09-16 DIAGNOSIS — IMO0002 Reserved for concepts with insufficient information to code with codable children: Secondary | ICD-10-CM

## 2013-09-16 DIAGNOSIS — I10 Essential (primary) hypertension: Secondary | ICD-10-CM

## 2013-09-16 DIAGNOSIS — N039 Chronic nephritic syndrome with unspecified morphologic changes: Secondary | ICD-10-CM

## 2013-09-16 DIAGNOSIS — J811 Chronic pulmonary edema: Secondary | ICD-10-CM | POA: Diagnosis present

## 2013-09-16 DIAGNOSIS — R079 Chest pain, unspecified: Secondary | ICD-10-CM

## 2013-09-16 DIAGNOSIS — F411 Generalized anxiety disorder: Secondary | ICD-10-CM | POA: Diagnosis present

## 2013-09-16 DIAGNOSIS — Z992 Dependence on renal dialysis: Secondary | ICD-10-CM

## 2013-09-16 DIAGNOSIS — G8929 Other chronic pain: Secondary | ICD-10-CM

## 2013-09-16 DIAGNOSIS — R112 Nausea with vomiting, unspecified: Secondary | ICD-10-CM

## 2013-09-16 DIAGNOSIS — N189 Chronic kidney disease, unspecified: Secondary | ICD-10-CM

## 2013-09-16 DIAGNOSIS — Z87891 Personal history of nicotine dependence: Secondary | ICD-10-CM

## 2013-09-16 DIAGNOSIS — F32A Depression, unspecified: Secondary | ICD-10-CM

## 2013-09-16 DIAGNOSIS — F329 Major depressive disorder, single episode, unspecified: Secondary | ICD-10-CM | POA: Diagnosis present

## 2013-09-16 DIAGNOSIS — E875 Hyperkalemia: Secondary | ICD-10-CM

## 2013-09-16 DIAGNOSIS — R51 Headache: Secondary | ICD-10-CM | POA: Diagnosis not present

## 2013-09-16 DIAGNOSIS — E8779 Other fluid overload: Secondary | ICD-10-CM | POA: Diagnosis present

## 2013-09-16 DIAGNOSIS — N2581 Secondary hyperparathyroidism of renal origin: Secondary | ICD-10-CM

## 2013-09-16 DIAGNOSIS — F3289 Other specified depressive episodes: Secondary | ICD-10-CM | POA: Diagnosis present

## 2013-09-16 DIAGNOSIS — E872 Acidosis, unspecified: Secondary | ICD-10-CM

## 2013-09-16 DIAGNOSIS — E119 Type 2 diabetes mellitus without complications: Secondary | ICD-10-CM | POA: Diagnosis present

## 2013-09-16 DIAGNOSIS — N186 End stage renal disease: Secondary | ICD-10-CM

## 2013-09-16 DIAGNOSIS — I161 Hypertensive emergency: Secondary | ICD-10-CM | POA: Diagnosis present

## 2013-09-16 DIAGNOSIS — Y830 Surgical operation with transplant of whole organ as the cause of abnormal reaction of the patient, or of later complication, without mention of misadventure at the time of the procedure: Secondary | ICD-10-CM | POA: Diagnosis present

## 2013-09-16 DIAGNOSIS — I120 Hypertensive chronic kidney disease with stage 5 chronic kidney disease or end stage renal disease: Principal | ICD-10-CM | POA: Diagnosis present

## 2013-09-16 DIAGNOSIS — J9600 Acute respiratory failure, unspecified whether with hypoxia or hypercapnia: Secondary | ICD-10-CM

## 2013-09-16 DIAGNOSIS — D631 Anemia in chronic kidney disease: Secondary | ICD-10-CM

## 2013-09-16 LAB — BASIC METABOLIC PANEL
BUN: 63 mg/dL — AB (ref 6–23)
CHLORIDE: 100 meq/L (ref 96–112)
CO2: 18 mEq/L — ABNORMAL LOW (ref 19–32)
Calcium: 9.4 mg/dL (ref 8.4–10.5)
Creatinine, Ser: 11.86 mg/dL — ABNORMAL HIGH (ref 0.50–1.35)
GFR calc Af Amer: 5 mL/min — ABNORMAL LOW (ref >90)
GFR calc non Af Amer: 4 mL/min — ABNORMAL LOW (ref >90)
Glucose, Bld: 129 mg/dL — ABNORMAL HIGH (ref 70–99)
POTASSIUM: 4.9 meq/L (ref 3.7–5.3)
Sodium: 139 mEq/L (ref 137–147)

## 2013-09-16 LAB — I-STAT TROPONIN, ED: Troponin i, poc: 0.06 ng/mL (ref 0.00–0.08)

## 2013-09-16 LAB — CBC
HEMATOCRIT: 33.2 % — AB (ref 39.0–52.0)
Hemoglobin: 10 g/dL — ABNORMAL LOW (ref 13.0–17.0)
MCH: 24.6 pg — ABNORMAL LOW (ref 26.0–34.0)
MCHC: 30.1 g/dL (ref 30.0–36.0)
MCV: 81.8 fL (ref 78.0–100.0)
PLATELETS: 253 10*3/uL (ref 150–400)
RBC: 4.06 MIL/uL — ABNORMAL LOW (ref 4.22–5.81)
RDW: 18 % — ABNORMAL HIGH (ref 11.5–15.5)
WBC: 5.7 10*3/uL (ref 4.0–10.5)

## 2013-09-16 LAB — TROPONIN I

## 2013-09-16 LAB — PRO B NATRIURETIC PEPTIDE: Pro B Natriuretic peptide (BNP): >70000 pg/mL — ABNORMAL HIGH (ref 0.0–100.0)

## 2013-09-16 LAB — LIPASE, BLOOD: Lipase: 20 U/L (ref 11–59)

## 2013-09-16 LAB — MRSA PCR SCREENING: MRSA BY PCR: NEGATIVE

## 2013-09-16 LAB — GLUCOSE, CAPILLARY: Glucose-Capillary: 101 mg/dL — ABNORMAL HIGH (ref 70–99)

## 2013-09-16 MED ORDER — LABETALOL HCL 200 MG PO TABS
200.0000 mg | ORAL_TABLET | Freq: Three times a day (TID) | ORAL | Status: DC
  Administered 2013-09-16: 200 mg via ORAL
  Filled 2013-09-16 (×4): qty 1

## 2013-09-16 MED ORDER — PANTOPRAZOLE SODIUM 40 MG IV SOLR
40.0000 mg | Freq: Every day | INTRAVENOUS | Status: DC
  Administered 2013-09-16 – 2013-09-19 (×4): 40 mg via INTRAVENOUS
  Filled 2013-09-16 (×6): qty 40

## 2013-09-16 MED ORDER — LORAZEPAM 2 MG/ML IJ SOLN
1.0000 mg | Freq: Once | INTRAMUSCULAR | Status: AC
  Administered 2013-09-16: 1 mg via INTRAVENOUS
  Filled 2013-09-16: qty 1

## 2013-09-16 MED ORDER — LISINOPRIL 10 MG PO TABS
10.0000 mg | ORAL_TABLET | Freq: Every day | ORAL | Status: DC
  Filled 2013-09-16: qty 1

## 2013-09-16 MED ORDER — SEVELAMER CARBONATE 800 MG PO TABS
800.0000 mg | ORAL_TABLET | Freq: Three times a day (TID) | ORAL | Status: DC
  Administered 2013-09-16 – 2013-09-20 (×15): 800 mg via ORAL
  Filled 2013-09-16 (×18): qty 1

## 2013-09-16 MED ORDER — CLONIDINE HCL 0.1 MG PO TABS
0.2000 mg | ORAL_TABLET | Freq: Once | ORAL | Status: AC
  Administered 2013-09-16: 0.2 mg via ORAL
  Filled 2013-09-16: qty 2

## 2013-09-16 MED ORDER — HYDRALAZINE HCL 20 MG/ML IJ SOLN
10.0000 mg | INTRAMUSCULAR | Status: DC
  Filled 2013-09-16: qty 0.5

## 2013-09-16 MED ORDER — LABETALOL HCL 5 MG/ML IV SOLN
10.0000 mg | INTRAVENOUS | Status: DC | PRN
  Administered 2013-09-16 (×3): 10 mg via INTRAVENOUS
  Filled 2013-09-16 (×2): qty 4

## 2013-09-16 MED ORDER — CINACALCET HCL 30 MG PO TABS
30.0000 mg | ORAL_TABLET | Freq: Every day | ORAL | Status: DC
  Administered 2013-09-17 – 2013-09-20 (×4): 30 mg via ORAL
  Filled 2013-09-16 (×5): qty 1

## 2013-09-16 MED ORDER — CLONIDINE HCL 0.2 MG PO TABS
0.2000 mg | ORAL_TABLET | Freq: Two times a day (BID) | ORAL | Status: DC
  Administered 2013-09-16 – 2013-09-17 (×3): 0.2 mg via ORAL
  Filled 2013-09-16 (×5): qty 1

## 2013-09-16 MED ORDER — TACROLIMUS 1 MG PO CAPS: 10.0000 mg | ORAL_CAPSULE | Freq: Two times a day (BID) | ORAL | Status: DC

## 2013-09-16 MED ORDER — HEPARIN SODIUM (PORCINE) 5000 UNIT/ML IJ SOLN
5000.0000 [IU] | Freq: Three times a day (TID) | INTRAMUSCULAR | Status: DC
  Administered 2013-09-16 – 2013-09-17 (×3): 5000 [IU] via SUBCUTANEOUS
  Filled 2013-09-16 (×14): qty 1

## 2013-09-16 MED ORDER — HYDRALAZINE HCL 20 MG/ML IJ SOLN: 20.0000 mg | INTRAMUSCULAR | Status: DC | PRN

## 2013-09-16 MED ORDER — MORPHINE SULFATE 4 MG/ML IJ SOLN
4.0000 mg | Freq: Once | INTRAMUSCULAR | Status: AC
  Administered 2013-09-16: 4 mg via INTRAVENOUS
  Filled 2013-09-16: qty 1

## 2013-09-16 MED ORDER — AMLODIPINE BESYLATE 10 MG PO TABS
10.0000 mg | ORAL_TABLET | Freq: Every day | ORAL | Status: DC
  Administered 2013-09-16 – 2013-09-20 (×5): 10 mg via ORAL
  Filled 2013-09-16 (×5): qty 1

## 2013-09-16 MED ORDER — NITROGLYCERIN IN D5W 200-5 MCG/ML-% IV SOLN
2.0000 ug/min | INTRAVENOUS | Status: DC
  Administered 2013-09-16: 5 ug/min via INTRAVENOUS
  Administered 2013-09-16: 50 ug/min via INTRAVENOUS
  Administered 2013-09-16: 80 ug/min via INTRAVENOUS
  Administered 2013-09-17: 100 ug/min via INTRAVENOUS
  Filled 2013-09-16 (×3): qty 250

## 2013-09-16 MED ORDER — ASPIRIN 81 MG PO CHEW
324.0000 mg | CHEWABLE_TABLET | ORAL | Status: AC
  Administered 2013-09-16: 324 mg via ORAL
  Filled 2013-09-16: qty 4

## 2013-09-16 MED ORDER — VALGANCICLOVIR HCL 450 MG PO TABS
450.0000 mg | ORAL_TABLET | ORAL | Status: DC
  Administered 2013-09-17 – 2013-09-20 (×2): 450 mg via ORAL
  Filled 2013-09-16 (×5): qty 1

## 2013-09-16 MED ORDER — HYDRALAZINE HCL 20 MG/ML IJ SOLN
20.0000 mg | Freq: Once | INTRAMUSCULAR | Status: AC
  Administered 2013-09-16: 20 mg via INTRAVENOUS
  Filled 2013-09-16: qty 1

## 2013-09-16 MED ORDER — MYCOPHENOLATE MOFETIL 250 MG PO CAPS
500.0000 mg | ORAL_CAPSULE | Freq: Two times a day (BID) | ORAL | Status: DC
  Administered 2013-09-16 – 2013-09-17 (×2): 500 mg via ORAL
  Filled 2013-09-16 (×3): qty 2

## 2013-09-16 MED ORDER — MYCOPHENOLATE MOFETIL 500 MG PO TABS: 500.0000 mg | ORAL_TABLET | Freq: Two times a day (BID) | ORAL | Status: DC

## 2013-09-16 MED ORDER — PREDNISONE 20 MG PO TABS
20.0000 mg | ORAL_TABLET | Freq: Every day | ORAL | Status: DC
  Filled 2013-09-16 (×2): qty 1

## 2013-09-16 MED ORDER — ASPIRIN 300 MG RE SUPP
300.0000 mg | RECTAL | Status: AC
  Filled 2013-09-16: qty 1

## 2013-09-16 MED ORDER — SULFAMETHOXAZOLE-TRIMETHOPRIM 400-80 MG PO TABS
1.0000 | ORAL_TABLET | ORAL | Status: DC
  Administered 2013-09-17 – 2013-09-20 (×2): 1 via ORAL
  Filled 2013-09-16 (×2): qty 1

## 2013-09-16 MED ORDER — SODIUM CHLORIDE 0.9 % IV SOLN: INTRAVENOUS | Status: DC

## 2013-09-16 MED ORDER — FUROSEMIDE 80 MG PO TABS
80.0000 mg | ORAL_TABLET | Freq: Every day | ORAL | Status: DC
  Administered 2013-09-17 – 2013-09-20 (×4): 80 mg via ORAL
  Filled 2013-09-16 (×4): qty 1

## 2013-09-16 MED ORDER — ALLOPURINOL 100 MG PO TABS
100.0000 mg | ORAL_TABLET | Freq: Every day | ORAL | Status: DC
  Administered 2013-09-16 – 2013-09-17 (×2): 100 mg via ORAL
  Filled 2013-09-16 (×5): qty 1

## 2013-09-16 MED ORDER — TACROLIMUS 1 MG PO CAPS
10.0000 mg | ORAL_CAPSULE | Freq: Two times a day (BID) | ORAL | Status: DC
  Administered 2013-09-16 – 2013-09-20 (×8): 10 mg via ORAL
  Filled 2013-09-16 (×9): qty 10

## 2013-09-16 NOTE — ED Notes (Signed)
Portable CXR

## 2013-09-16 NOTE — ED Notes (Signed)
Pt resting comfortably on stretcher, pt reports relief of anxiety and nausea.  No further vomiting.  NTG inf per protocol, will continue to monitor.

## 2013-09-16 NOTE — ED Notes (Signed)
Dr. Vanita Panda aware of unchanged BP.  NTG remains infusing a/o.

## 2013-09-16 NOTE — ED Notes (Addendum)
Pt c/o lt sided chest pain that started earlier today.  States that it is a throbbing pain w/ SOB.  Denies cardiac hx. Has dialysis catheter in rt chest.  Last dialysis was Tuesday.  Dialysis was cancelled for today.  Has had recent cough.

## 2013-09-16 NOTE — H&P (Signed)
Name: Frank [name] MRN: [id number] DOB: [birth year]    ADMISSION DATE:  09/16/2013 CONSULTATION DATE:  2/26  PRIMARY SERVICE: PCCM Renal consult   CHIEF COMPLAINT:  Dyspnea and Chest pain   BRIEF PATIENT DESCRIPTION:  48 year old male w/ ESRD, prior renal x plant (HD Tues/Thurs/SAT in [place] by [facility]). Admitted 2/26 w/ progressive CP and dyspnea. HD cancelled on 2/26 d/t snow. Last HD was on 2/24. On presentation: SBP 205, CXR w/ edema. Wt 79.9 kg (up from 77.4 on 2/22) was and BNP >70K. Was transferred to [facility] in anticipation of need for urgent HD.   SIGNIFICANT EVENTS / STUDIES:  UDS 2/26>>>  LINES / TUBES: Chronic HD cath (tunneled right IJ)>>>  CULTURES:  ANTIBIOTICS:  HISTORY OF PRESENT ILLNESS:   This is a 48 year old male w/ ESRD. Just discharged 2/22 from [facility] for evaluation of CP. Cardiac enzymes were nml, EKG unremarkable. Sent home. Had HD last on 2/24. On 2/25 began to have some increase in shortness of breath and CP. HD was cancelled on 2/26 due to snow. CP and dyspnea worse so presented to the ER. On presentation SBP 205, CXR w/ edema. Wt 79.9 kg (up from was and BNP >70K. Was transferred to [facility] in anticipation of need for urgent HD.  >denied drug use >denied missing scheduled meds >denies missing HD w/ exception of today   PAST MEDICAL HISTORY :  Past Medical History  Diagnosis Date  . Hypertension   . ESRD (end stage renal disease)     due to HTN per patient, followed at [facility], s/p failed kidney transplant  . Depression   . Complication of anesthesia     itching, sore throat  . Diabetes mellitus without complication     No history per patient, but remains under history as A1c would not be accurate given on dialysis   Past Surgical History  Procedure Laterality Date  . Kidney receipient  2006    failed and started HD in March 2014  . Capd insertion    . Capd removal     Prior to Admission medications   Medication Sig Start Date End Date Taking?  Authorizing Provider  allopurinol (ZYLOPRIM) 100 MG tablet Take 1 tablet (100 mg total) by mouth daily. 04/21/13  Yes [name], MD  amLODipine (NORVASC) 5 MG tablet Take 10 mg by mouth daily.   Yes Historical Provider, MD  benzonatate (TESSALON) 100 MG capsule Take 2 capsules (200 mg total) by mouth 2 (two) times daily as needed for cough. 09/07/13  Yes Lauren Burnetta Sabin, PA-C  cinacalcet (SENSIPAR) 30 MG tablet Take 30 mg by mouth daily.   Yes Historical Provider, MD  cloNIDine (CATAPRES) 0.2 MG tablet Take 0.2 mg by mouth 2 (two) times daily.   Yes Historical Provider, MD  colchicine 0.6 MG tablet Take 0.6 mg by mouth daily as needed (for gout). 04/13/13  Yes [name], MD  furosemide (LASIX) 80 MG tablet Take 80 mg by mouth daily.   Yes Historical Provider, MD  labetalol (NORMODYNE) 200 MG tablet Take 200 mg by mouth 3 (three) times daily.   Yes Historical Provider, MD  lisinopril (PRINIVIL,ZESTRIL) 10 MG tablet Take 10 mg by mouth daily. 05/11/13  Yes Historical Provider, MD  mycophenolate (CELLCEPT) 500 MG tablet Take 500 mg by mouth 2 (two) times daily.   Yes Historical Provider, MD  omeprazole (PRILOSEC) 20 MG capsule Take 20 mg by mouth daily. 07/01/13  Yes  Historical Provider, MD  oxyCODONE-acetaminophen (PERCOCET) 10-325 MG per tablet Take 1 tablet by mouth every 4 (four) hours as needed for pain.   Yes Historical Provider, MD  predniSONE (DELTASONE) 20 MG tablet Take 20 mg by mouth daily with breakfast.   Yes Historical Provider, MD  sevelamer carbonate (RENVELA) 800 MG tablet Take 800 mg by mouth 4 (four) times daily - after meals and at bedtime.   Yes Historical Provider, MD  sulfamethoxazole-trimethoprim (BACTRIM,SEPTRA) 400-80 MG per tablet Take 1 tablet by mouth every Monday, Wednesday, and Friday. 07/01/13  Yes Historical Provider, MD  tacrolimus (PROGRAF) 5 MG capsule Take 10 mg by mouth 2 (two) times daily.    Yes Historical Provider, MD  valGANciclovir (VALCYTE) 450 MG  tablet Take 450 mg by mouth every Monday, Wednesday, and Friday.   Yes Historical Provider, MD   Allergies  Allergen Reactions  . Ferrlecit [Na Ferric Gluc Cplx In Sucrose] Shortness Of Breath, Swelling and Other (See Comments)    Swelling in throat  . Darvocet [Propoxyphene N-Acetaminophen] Hives    FAMILY HISTORY:  No family history on file. SOCIAL HISTORY:  reports that he has quit smoking. His smoking use included Cigarettes. He has a 1 pack-year smoking history. He has never used smokeless tobacco. He reports that he does not drink alcohol or use illicit drugs.  Review of Systems:   Bolds are positive  Constitutional: weight loss, gain, night sweats, Fevers, chills, fatigue .  HEENT: headaches, Sore throat, sneezing, nasal congestion, post nasal drip, Difficulty swallowing, Tooth/dental problems, visual complaints visual changes, ear ache CV:  chest pain, radiates: ,Orthopnea, PND, swelling in lower extremities, dizziness, palpitations, syncope.  GI  heartburn, indigestion, abdominal pain, nausea, vomiting, diarrhea, change in bowel habits, loss of appetite, bloody stools.  Resp: cough, productive: , hemoptysis, dyspnea, chest pain, pleuritic.  Skin: rash or itching or icterus GU: dysuria, change in color of urine, urgency or frequency. flank pain, hematuria  MS: joint pain or swelling. decreased range of motion  Psych: change in mood or affect. depression or anxiety.  Neuro: difficulty with speech, weakness, numbness, ataxia    SUBJECTIVE:  No distress  VITAL SIGNS: Temp:  [97.5 F (36.4 C)-98.5 F (36.9 C)] 97.6 F (36.4 C) (02/26 2037) Pulse Rate:  [78-90] 80 (02/26 1955) Resp:  [0-21] 0 (02/26 1955) BP: (169-205)/(127-150) 186/138 mmHg (02/26 2037) SpO2:  [90 %-100 %] 100 % (02/26 1955) HEMODYNAMICS:   VENTILATOR SETTINGS:   INTAKE / OUTPUT: Intake/Output   None     PHYSICAL EXAMINATION: General:  No acute distress  Neuro:  Sleepy, got ativan, oriented no  focal def  HEENT:  [place], no JVD  Cardiovascular:  rrr Lungs:  Scattered rhonchi  Abdomen:  Non-tender  Musculoskeletal:  Intact  Skin:  Dry, LE edema   LABS:  CBC  Recent Labs Lab 09/11/13 2130 09/12/13 0506 09/16/13 1530  WBC 5.3 4.6 5.7  HGB 9.6* 9.2* 10.0*  HCT 31.8* 30.0* 33.2*  PLT 239 231 253   Coag's No results found for this basename: APTT, INR,  in the last 168 hours BMET  Recent Labs Lab 09/11/13 2130 09/12/13 0506 09/16/13 1530  NA 140 138 139  K 4.1 4.2 4.9  CL 99 98 100  CO2 26 26 18*  BUN 36* 38* 63*  CREATININE 9.11* 9.62* 11.86*  GLUCOSE 103* 140* 129*   Electrolytes  Recent Labs Lab 09/11/13 2130 09/12/13 0506 09/16/13 1530  CALCIUM 9.0 9.2 9.4   Sepsis Markers  Recent Labs Lab 09/11/13 2146  LATICACIDVEN 1.26   ABG No results found for this basename: PHART, PCO2ART, PO2ART,  in the last 168 hours Liver Enzymes No results found for this basename: AST, ALT, ALKPHOS, BILITOT, ALBUMIN,  in the last 168 hours Cardiac Enzymes  Recent Labs Lab 09/12/13 0506 09/12/13 0913 09/12/13 1414 09/16/13 1530  TROPONINI <0.30 <0.30 <0.30  --   PROBNP  --   --   --  >70000.0*   Glucose  Recent Labs Lab 09/12/13 0719 09/12/13 1146 09/12/13 1631 09/16/13 2036  GLUCAP 142* 141* 173* 101*    Imaging Dg Chest Port 1 View  09/16/2013   CLINICAL DATA:  Chest pain and shortness of breath.  Weakness.  EXAM: PORTABLE CHEST - 1 VIEW  COMPARISON:  PA and lateral chest 09/11/2013.  FINDINGS: There is cardiomegaly and interstitial pulmonary edema. There also appears to be a small left pleural effusion. No pneumothorax.  IMPRESSION: Cardiomegaly and pulmonary edema. Likely small effusion on the left noted.   Electronically Signed   By: Inge Rise M.D.   On: 09/16/2013 16:06     CXR: progressive pulmonary edema R>L   ASSESSMENT / PLAN:  PULMONARY A:  Dyspnea in setting of pulmonary edema  P:   Supplemental oxygen Pulse ox BIPAP if  needed   CARDIOVASCULAR A:  Hypertensive emergency (presented w/ SBP 205), in setting of no HD since 2/24 Chest pain. Just discharged on 2/22 ACS ruled out.  Pulmonary edema  P:  Cont home meds NTG for Chest pain PRN hydralazine  Cycle CEs  Target SBP 170-180  RENAL A:   ESRD (wt at last d/c 77.4, presented at 79.9. States dry wt 77 kg from [facility] ) borderline hyperkalemia  Metabolic acidosis (mild AG) Secondary hyperparathyroidism (of renal origin)  P:   Renal notified by EDP For HD in am 2/27  GASTROINTESTINAL A:  Nausea  P:   Symptomatic treatment   HEMATOLOGIC A:  Anemia of chronic disease  P:  No evidence of bleeding Trend cbc [place] heparin   INFECTIOUS A:  Immunosuppression: on cellcept, prograft and pred at home s/p renal x plant) P:   Continue home meds   ENDOCRINE A:  No acute  P:   Trend CBG  NEUROLOGIC A:  Anxiety P:   Received single dose Ativan, may need to make this available prn depending on trend  TODAY'S SUMMARY: will admit to ICU. Focus on BP management. Should be able to get HD in am. Will watch pulse ox and WOB. May need BIPAP to assist w/ dyspnea.   I have personally obtained a history, examined the patient, evaluated laboratory and imaging results, formulated the assessment and plan and placed orders.  CRITICAL CARE: The patient is critically ill with multiple organ systems failure and requires high complexity decision making for assessment and support, frequent evaluation and titration of therapies, application of advanced monitoring technologies and extensive interpretation of multiple databases. Critical Care Time devoted to patient care services described in this note is 50 minutes.   Baltazar Apo, MD, PhD 09/16/2013, 10:32 PM [facility] [phone] or if no answer [phone]

## 2013-09-16 NOTE — ED Notes (Signed)
Attempted to call report to [facility], RN unavailable at this time.

## 2013-09-16 NOTE — ED Notes (Signed)
Pt actively vomiting, Dr. Vanita Panda made aware.

## 2013-09-16 NOTE — ED Notes (Signed)
Pt requesting "something to put me to sleep", Dr. Vanita Panda made aware.

## 2013-09-16 NOTE — ED Notes (Signed)
Initial Contact - pt to [facility] with c/o CP/SOB today.  Speaking full/clear sentences, ls rhonci throughout, congested cough noted.  Per pt, nonproductive.  Pt reports was due for HD today, but missed appt 2/2 the weather.  Pt sts pain nonradiating, L ant, not reproducible.  7/10.  Skin PWD.  MAEI.  A+Ox4.  EDP at bs for eval.

## 2013-09-16 NOTE — ED Notes (Signed)
[facility] here to provide safe transport to [facility] ICU.  NAD upon leaving dept.

## 2013-09-16 NOTE — ED Provider Notes (Signed)
CSN: [id number]     Arrival date & time 09/16/13  1511 History   First MD Initiated Contact with Patient 09/16/13 1527     Chief Complaint  Patient presents with  . Chest Pain  . Shortness of Breath      HPI  Patient p/w CP / SOB Sx began yesterday, initially w SOB.  Subsequently he developed CP (L / upper / sore / non-radiating).  No clear alleviating or exacerbating factors. No f/c. No new LE edema. No n/v/d. He has had similar episodes multiple times recently. Last HD 2d PTA. No HD today (due to weather) No cigs / etoh No Hx of cad / acs  Past Medical History  Diagnosis Date  . Hypertension   . ESRD (end stage renal disease)     due to HTN per patient, followed at [facility], s/p failed kidney transplant  . Depression   . Complication of anesthesia     itching, sore throat  . Diabetes mellitus without complication     No history per patient, but remains under history as A1c would not be accurate given on dialysis   Past Surgical History  Procedure Laterality Date  . Kidney receipient  2006    failed and started HD in March 2014  . Capd insertion    . Capd removal     No family history on file. History  Substance Use Topics  . Smoking status: Former Smoker -- 1.00 packs/day for 1 years    Types: Cigarettes  . Smokeless tobacco: Never Used     Comment: quit Jan 2014  . Alcohol Use: No    Review of Systems  Constitutional:       Per HPI, otherwise negative  HENT:       Per HPI, otherwise negative  Respiratory:       Per HPI, otherwise negative  Cardiovascular:       Per HPI, otherwise negative  Gastrointestinal: Negative for vomiting.  Endocrine:       Negative aside from HPI  Genitourinary:       Neg aside from HPI   Musculoskeletal:       Per HPI, otherwise negative  Skin: Negative.   Neurological: Negative for syncope.      Allergies  Ferrlecit and Darvocet  Home Medications   Current Outpatient Rx  Name  Route  Sig  Dispense  Refill  .  allopurinol (ZYLOPRIM) 100 MG tablet   Oral   Take 1 tablet (100 mg total) by mouth daily.   30 tablet   6   . amLODipine (NORVASC) 5 MG tablet   Oral   Take 10 mg by mouth daily.         . benzonatate (TESSALON) 100 MG capsule   Oral   Take 2 capsules (200 mg total) by mouth 2 (two) times daily as needed for cough.   20 capsule   0   . cinacalcet (SENSIPAR) 30 MG tablet   Oral   Take 30 mg by mouth daily.         . cloNIDine (CATAPRES) 0.2 MG tablet   Oral   Take 0.2 mg by mouth 2 (two) times daily.         . colchicine 0.6 MG tablet   Oral   Take 0.6 mg by mouth daily as needed (for gout).         . furosemide (LASIX) 80 MG tablet   Oral   Take 80 mg by mouth  daily.         . labetalol (NORMODYNE) 200 MG tablet   Oral   Take 200 mg by mouth 3 (three) times daily.         [name] lisinopril (PRINIVIL,ZESTRIL) 10 MG tablet   Oral   Take 10 mg by mouth daily.         . mycophenolate (CELLCEPT) 500 MG tablet   Oral   Take 500 mg by mouth 2 (two) times daily.         [name] omeprazole (PRILOSEC) 20 MG capsule   Oral   Take 20 mg by mouth daily.         [name] oxyCODONE-acetaminophen (PERCOCET) 10-325 MG per tablet   Oral   Take 1 tablet by mouth every 4 (four) hours as needed for pain.         . predniSONE (DELTASONE) 20 MG tablet   Oral   Take 20 mg by mouth daily with breakfast.         . sevelamer carbonate (RENVELA) 800 MG tablet   Oral   Take 800 mg by mouth 4 (four) times daily - after meals and at bedtime.         . sulfamethoxazole-trimethoprim (BACTRIM,SEPTRA) 400-80 MG per tablet   Oral   Take 1 tablet by mouth every Monday, Wednesday, and Friday.         . tacrolimus (PROGRAF) 5 MG capsule   Oral   Take 10 mg by mouth 2 (two) times daily.          . valGANciclovir (VALCYTE) 450 MG tablet   Oral   Take 450 mg by mouth every Monday, Wednesday, and Friday.          BP 197/147  Pulse 90  Temp(Src) 98.5 F (36.9 C) (Oral)   Resp 16  SpO2 96% Physical Exam  Nursing note and vitals reviewed. Constitutional: He is oriented to person, place, and time. He appears well-developed. No distress.  HENT:  Head: Normocephalic and atraumatic.  Eyes: Conjunctivae and EOM are normal.  Cardiovascular: Normal rate and regular rhythm.   Pulmonary/Chest: Effort normal. No stridor. No respiratory distress.    Abdominal: He exhibits no distension.  Musculoskeletal: He exhibits no edema.  Neurological: He is alert and oriented to person, place, and time.  Skin: Skin is warm and dry.  Psychiatric: He has a normal mood and affect.    ED Course  Procedures (including critical care time) Labs Review Labs Reviewed  CBC - Abnormal; Notable for the following:    RBC 4.06 (*)    Hemoglobin 10.0 (*)    HCT 33.2 (*)    MCH 24.6 (*)    RDW 18.0 (*)    All other components within normal limits  BASIC METABOLIC PANEL - Abnormal; Notable for the following:    CO2 18 (*)    Glucose, Bld 129 (*)    BUN 63 (*)    Creatinine, Ser 11.86 (*)    GFR calc non Af Amer 4 (*)    GFR calc Af Amer 5 (*)    All other components within normal limits  PRO B NATRIURETIC PEPTIDE - Abnormal; Notable for the following:    Pro B Natriuretic peptide (BNP) >70000.0 (*)    All other components within normal limits  Randolm Idol, ED   Imaging Review Dg Chest Port 1 View  09/16/2013   CLINICAL DATA:  Chest pain and shortness of breath.  Weakness.  EXAM: PORTABLE  CHEST - 1 VIEW  COMPARISON:  PA and lateral chest 09/11/2013.  FINDINGS: There is cardiomegaly and interstitial pulmonary edema. There also appears to be a small left pleural effusion. No pneumothorax.  IMPRESSION: Cardiomegaly and pulmonary edema. Likely small effusion on the left noted.   Electronically Signed   By: Inge Rise M.D.   On: 09/16/2013 16:06    EKG Interpretation    Date/Time:  Thursday September 16 2013 15:21:31 EST Ventricular Rate:  87 PR Interval:  164 QRS  Duration: 87 QT Interval:  405 QTC Calculation: 487 R Axis:   -36 Text Interpretation:  Sinus rhythm  left atrial enlargement Left axis deviation TWI lateral precordial lead, noted on previous from 2/22 Confirmed by KOHUT  MD, Cliff ([id number]) on 09/16/2013 3:27:36 PM          With notable BP on first eval patient will receive clonidine / labetalol  O2- 99%ra , nml  5:15 PM After clonidine, labetalol x2, patient's blood pressure remains substantially elevated.  5:31 PM Patient remains HTN, now w increasing nausea and vomiting.  Ativan and Nitro drip ordered  6:15 PM bp similar, but patient is more calm.  6:57 PM BP slightly lower, at high dose of continuous Nitro drip (19mg)  I discussed the patient's case with renal and ICU.  [facility]  MDM   Final diagnoses:  Hypertensive emergency    Patient presents with chest pain, dyspnea.  Notably, on initial exam the patient is hypertensive, with a diastolic is substantially elevated.  The patient has not had dialysis in 2 days, and there is [facility] for [facility] status as well as uncontrolled hypertension.  Patient's symptoms initially progressed, notably improved eventually with initiation of a nitro drip, Ativan.  With hypertensive emergency, need for dialysis, the patient required transfer, and this was accomplished after I discussed his case with our ICU team, nephrology team.  CRITICAL CARE Performed by: LCarmin MuskratTotal critical care time: 45 Critical care time was exclusive of separately billable procedures and treating other patients. Critical care was necessary to treat or prevent imminent or life-threatening deterioration. Critical care was time spent personally by me on the following activities: development of treatment plan with patient and/or surrogate as well as nursing, discussions with consultants, evaluation of patient's response to treatment, examination of patient,  obtaining history from patient or surrogate, ordering and performing treatments and interventions, ordering and review of laboratory studies, ordering and review of radiographic studies, pulse oximetry and re-evaluation of patient's condition.     RCarmin Muskrat MD 09/16/13 [phone]

## 2013-09-17 LAB — BASIC METABOLIC PANEL
BUN: 69 mg/dL — ABNORMAL HIGH (ref 6–23)
CO2: 20 meq/L (ref 19–32)
Calcium: 8.9 mg/dL (ref 8.4–10.5)
Chloride: 102 mEq/L (ref 96–112)
Creatinine, Ser: 12.67 mg/dL — ABNORMAL HIGH (ref 0.50–1.35)
GFR calc Af Amer: 5 mL/min — ABNORMAL LOW (ref >90)
GFR, EST NON AFRICAN AMERICAN: 4 mL/min — AB (ref >90)
GLUCOSE: 100 mg/dL — AB (ref 70–99)
POTASSIUM: 4.7 meq/L (ref 3.7–5.3)
Sodium: 139 mEq/L (ref 137–147)

## 2013-09-17 LAB — RAPID URINE DRUG SCREEN, HOSP PERFORMED
Amphetamines: NOT DETECTED
Barbiturates: NOT DETECTED
Benzodiazepines: NOT DETECTED
Cocaine: NOT DETECTED
Opiates: NOT DETECTED
TETRAHYDROCANNABINOL: POSITIVE — AB

## 2013-09-17 LAB — CBC
HCT: 29.1 % — ABNORMAL LOW (ref 39.0–52.0)
Hemoglobin: 9.2 g/dL — ABNORMAL LOW (ref 13.0–17.0)
MCH: 25.4 pg — AB (ref 26.0–34.0)
MCHC: 31.6 g/dL (ref 30.0–36.0)
MCV: 80.4 fL (ref 78.0–100.0)
Platelets: 235 10*3/uL (ref 150–400)
RBC: 3.62 MIL/uL — ABNORMAL LOW (ref 4.22–5.81)
RDW: 17.8 % — ABNORMAL HIGH (ref 11.5–15.5)
WBC: 6.8 10*3/uL (ref 4.0–10.5)

## 2013-09-17 LAB — GLUCOSE, CAPILLARY
GLUCOSE-CAPILLARY: 117 mg/dL — AB (ref 70–99)
GLUCOSE-CAPILLARY: 102 mg/dL — AB (ref 70–99)
Glucose-Capillary: 109 mg/dL — ABNORMAL HIGH (ref 70–99)
Glucose-Capillary: 124 mg/dL — ABNORMAL HIGH (ref 70–99)

## 2013-09-17 LAB — TROPONIN I
Troponin I: <0.3 ng/mL (ref <0.30)
Troponin I: <0.3 ng/mL (ref <0.30)

## 2013-09-17 LAB — HEPATITIS B SURFACE ANTIGEN: Hepatitis B Surface Ag: NEGATIVE

## 2013-09-17 MED ORDER — NEPRO/CARBSTEADY PO LIQD: 237.0000 mL | ORAL | Status: DC | PRN

## 2013-09-17 MED ORDER — MYCOPHENOLATE MOFETIL 250 MG PO CAPS
250.0000 mg | ORAL_CAPSULE | Freq: Two times a day (BID) | ORAL | Status: DC
  Administered 2013-09-17 – 2013-09-20 (×6): 250 mg via ORAL
  Filled 2013-09-17 (×8): qty 1

## 2013-09-17 MED ORDER — ALTEPLASE 2 MG IJ SOLR
2.0000 mg | Freq: Once | INTRAMUSCULAR | Status: AC | PRN
  Filled 2013-09-17: qty 2

## 2013-09-17 MED ORDER — LABETALOL HCL 200 MG PO TABS
200.0000 mg | ORAL_TABLET | Freq: Two times a day (BID) | ORAL | Status: DC
  Administered 2013-09-17 (×2): 200 mg via ORAL
  Filled 2013-09-17 (×4): qty 1

## 2013-09-17 MED ORDER — LIDOCAINE HCL (PF) 1 % IJ SOLN: 5.0000 mL | INTRAMUSCULAR | Status: DC | PRN

## 2013-09-17 MED ORDER — LISINOPRIL 20 MG PO TABS
20.0000 mg | ORAL_TABLET | Freq: Two times a day (BID) | ORAL | Status: DC
  Administered 2013-09-17 – 2013-09-18 (×4): 20 mg via ORAL
  Filled 2013-09-17 (×6): qty 1

## 2013-09-17 MED ORDER — PENTAFLUOROPROP-TETRAFLUOROETH EX AERO: 1.0000 "application " | INHALATION_SPRAY | CUTANEOUS | Status: DC | PRN

## 2013-09-17 MED ORDER — PREDNISONE 5 MG PO TABS
15.0000 mg | ORAL_TABLET | Freq: Every day | ORAL | Status: DC
  Administered 2013-09-18 – 2013-09-20 (×3): 15 mg via ORAL
  Filled 2013-09-17 (×4): qty 1

## 2013-09-17 MED ORDER — HEPARIN SODIUM (PORCINE) 1000 UNIT/ML DIALYSIS
20.0000 [IU]/kg | INTRAMUSCULAR | Status: DC | PRN
  Filled 2013-09-17: qty 2

## 2013-09-17 MED ORDER — SODIUM CHLORIDE 0.9 % IV SOLN: 100.0000 mL | INTRAVENOUS | Status: DC | PRN

## 2013-09-17 MED ORDER — OXYCODONE-ACETAMINOPHEN 5-325 MG PO TABS
1.0000 | ORAL_TABLET | Freq: Once | ORAL | Status: AC
  Administered 2013-09-17: 1 via ORAL
  Filled 2013-09-17: qty 1

## 2013-09-17 MED ORDER — LIDOCAINE-PRILOCAINE 2.5-2.5 % EX CREA: 1.0000 "application " | TOPICAL_CREAM | CUTANEOUS | Status: DC | PRN

## 2013-09-17 MED ORDER — ACETAMINOPHEN 325 MG PO TABS
650.0000 mg | ORAL_TABLET | Freq: Four times a day (QID) | ORAL | Status: DC | PRN
  Administered 2013-09-17: 650 mg via ORAL
  Filled 2013-09-17: qty 2

## 2013-09-17 MED ORDER — HEPARIN SODIUM (PORCINE) 1000 UNIT/ML DIALYSIS
1000.0000 [IU] | INTRAMUSCULAR | Status: DC | PRN
  Filled 2013-09-17: qty 1

## 2013-09-17 NOTE — Progress Notes (Signed)
Name: Frank [name] MRN: [id number] DOB: [birth year]    ADMISSION DATE:  09/16/2013 CONSULTATION DATE:  09/16/2013  REFERRING MD :  EDP PRIMARY SERVICE: PCCM  CHIEF COMPLAINT:  Dyspnea and chest pain  BRIEF PATIENT DESCRIPTION: 48 year old male w/ ESRD, prior renal x plant (HD Tues/Thurs/SAT in [place] by [facility]). Admitted 2/26 w/ progressive CP and dyspnea. HD cancelled on 2/26 d/t snow. Last HD was on 2/24. On presentation: SBP 205, CXR w/ edema. Wt 79.9 kg (up from 77.4 on 2/22) was and BNP >70K. Was transferred to [facility] in anticipation of need for urgent HD.   SIGNIFICANT EVENTS / STUDIES:  UDS 2/26 >>> +THC  LINES / TUBES: Chronic HD cath (tunneled right IJ)>>>  CULTURES: none  ANTIBIOTICS: none  SUBJECTIVE: states is breathing slightly better. Chest pain is intermittent. C/o headache on NTG drip  VITAL SIGNS: Temp:  [97.5 F (36.4 C)-98.5 F (36.9 C)] 97.6 F (36.4 C) (02/27 0713) Pulse Rate:  [78-90] 81 (02/27 0713) Resp:  [0-23] 17 (02/27 0713) BP: (150-205)/(103-150) 159/112 mmHg (02/27 0713) SpO2:  [90 %-100 %] 96 % (02/27 0713) Weight:  [79.9 kg (176 lb 2.4 oz)] 79.9 kg (176 lb 2.4 oz) (02/27 0456) HEMODYNAMICS:   VENTILATOR SETTINGS:   INTAKE / OUTPUT: Intake/Output     02/26 0701 - 02/27 0700 02/27 0701 - 02/28 0700   P.O. 240    I.V. (mL/kg) 369.8 (4.6)    Total Intake(mL/kg) 609.8 (7.6)    Urine (mL/kg/hr) 250    Total Output 250     Net +359.8            PHYSICAL EXAMINATION: General: No acute distress  Neuro: alert, answers questions appropriately  HEENT: [place]  Cardiovascular: rrr  Lungs: good air movement, clear Abdomen: Non-tender  Musculoskeletal: Intact, mild tenderness in left upper chest Skin: Dry, LE edema    LABS:  CBC  Recent Labs Lab 09/12/13 0506 09/16/13 1530 09/17/13 0245  WBC 4.6 5.7 6.8  HGB 9.2* 10.0* 9.2*  HCT 30.0* 33.2* 29.1*  PLT 231 253 235   Coag's No results found for this basename: APTT, INR,  in the  last 168 hours BMET  Recent Labs Lab 09/12/13 0506 09/16/13 1530 09/17/13 0245  NA 138 139 139  K 4.2 4.9 4.7  CL 98 100 102  CO2 26 18* 20  BUN 38* 63* 69*  CREATININE 9.62* 11.86* 12.67*  GLUCOSE 140* 129* 100*   Electrolytes  Recent Labs Lab 09/12/13 0506 09/16/13 1530 09/17/13 0245  CALCIUM 9.2 9.4 8.9   Sepsis Markers  Recent Labs Lab 09/11/13 2146  LATICACIDVEN 1.26   ABG No results found for this basename: PHART, PCO2ART, PO2ART,  in the last 168 hours Liver Enzymes No results found for this basename: AST, ALT, ALKPHOS, BILITOT, ALBUMIN,  in the last 168 hours Cardiac Enzymes  Recent Labs Lab 09/12/13 1414 09/16/13 1530 09/16/13 2140 09/17/13 0245  TROPONINI <0.30  --  <0.30 <0.30  PROBNP  --  >70000.0*  --   --    Glucose  Recent Labs Lab 09/12/13 0719 09/12/13 1146 09/12/13 1631 09/16/13 2036 09/17/13 0013  GLUCAP 142* 141* 173* 101* 124*    Imaging Dg Chest Port 1 View  09/16/2013   CLINICAL DATA:  Chest pain and shortness of breath.  Weakness.  EXAM: PORTABLE CHEST - 1 VIEW  COMPARISON:  PA and lateral chest 09/11/2013.  FINDINGS: There is cardiomegaly and interstitial pulmonary edema. There also appears to  be a small left pleural effusion. No pneumothorax.  IMPRESSION: Cardiomegaly and pulmonary edema. Likely small effusion on the left noted.   Electronically Signed   By: Inge Rise M.D.   On: 09/16/2013 16:06     CXR: ?small effusion on the left  ASSESSMENT / PLAN:  PULMONARY  A: Dyspnea in setting of pulmonary edema  P:  Supplemental oxygen  Pulse ox   CARDIOVASCULAR  A:  Hypertensive emergency (presented w/ SBP 205), in setting of no HD since 2/24  Chest pain. Just discharged on 2/22 ACS ruled out.  Pulmonary edema  Troponins negative Elevated BNP in setting of renal failure and volume overload P:  Cont home meds - clonidine, labetalol, lisniopril, amlodipine NTG to wean off PRN hydralazine  Target MAP 100     RENAL  A:  ESRD (wt at last d/c 77.4, presented at 79.9. States dry wt 77 kg from [facility] )  Metabolic acidosis (mild AG)  Secondary hyperparathyroidism (of renal origin)  P:  Renal notified by EDP  HD today to remove 3 L Continue cinacalcet and renvela  GASTROINTESTINAL  A: Nausea  P:  Symptomatic treatment   HEMATOLOGIC  A: Anemia of chronic disease  P:  No evidence of bleeding  Trend cbc  [place] heparin   INFECTIOUS  A: Immunosuppression: on cellcept, prograft and pred at home s/p renal x plant  P:  Continue home meds   ENDOCRINE  A: No acute  P:  Trend CBG   NEUROLOGIC  A: Anxiety  P:  Monitor   Percocet x1 for HA  TODAY'S SUMMARY: to have dialysis today, continue to monitor BP, transfer to SDU after dialysis  I have personally obtained a history, examined the patient, evaluated laboratory and imaging results, formulated the assessment and plan and placed orders.   Frank [name]  Pulmonary and [place] Pager: [phone]  09/17/2013, 7:37 AM

## 2013-09-17 NOTE — Consult Note (Signed)
Referring Provider: No ref. provider found Primary Care Physician:  Adin Hector Primary Nephrologist:  Dr[facility]  Reason for Consultation: medical management of ESRD  Anemia and secondary HPT  HPI: Missed dialysis yesterday due to snow and presented to the ER at [facility] with onset of shortness of breath and fluid overload on CXR. Usually runs on TTS  Past Medical History  Diagnosis Date  . Hypertension   . ESRD (end stage renal disease)     due to HTN per patient, followed at [facility], s/p failed kidney transplant  . Depression   . Complication of anesthesia     itching, sore throat  . Diabetes mellitus without complication     No history per patient, but remains under history as A1c would not be accurate given on dialysis    Past Surgical History  Procedure Laterality Date  . Kidney receipient  2006    failed and started HD in March 2014  . Capd insertion    . Capd removal      Prior to Admission medications   Medication Sig Start Date End Date Taking? Authorizing Provider  allopurinol (ZYLOPRIM) 100 MG tablet Take 1 tablet (100 mg total) by mouth daily. 04/21/13  Yes [name], MD  amLODipine (NORVASC) 5 MG tablet Take 10 mg by mouth daily.   Yes Historical Provider, MD  benzonatate (TESSALON) 100 MG capsule Take 2 capsules (200 mg total) by mouth 2 (two) times daily as needed for cough. 09/07/13  Yes Lauren Burnetta Sabin, PA-C  cinacalcet (SENSIPAR) 30 MG tablet Take 30 mg by mouth daily.   Yes Historical Provider, MD  cloNIDine (CATAPRES) 0.2 MG tablet Take 0.2 mg by mouth 2 (two) times daily.   Yes Historical Provider, MD  colchicine 0.6 MG tablet Take 0.6 mg by mouth daily as needed (for gout). 04/13/13  Yes [name], MD  furosemide (LASIX) 80 MG tablet Take 80 mg by mouth daily.   Yes Historical Provider, MD  labetalol (NORMODYNE) 200 MG tablet Take 200 mg by mouth 3 (three) times daily.   Yes Historical Provider, MD  lisinopril (PRINIVIL,ZESTRIL)  10 MG tablet Take 10 mg by mouth daily. 05/11/13  Yes Historical Provider, MD  mycophenolate (CELLCEPT) 500 MG tablet Take 500 mg by mouth 2 (two) times daily.   Yes Historical Provider, MD  omeprazole (PRILOSEC) 20 MG capsule Take 20 mg by mouth daily. 07/01/13  Yes Historical Provider, MD  oxyCODONE-acetaminophen (PERCOCET) 10-325 MG per tablet Take 1 tablet by mouth every 4 (four) hours as needed for pain.   Yes Historical Provider, MD  predniSONE (DELTASONE) 20 MG tablet Take 20 mg by mouth daily with breakfast.   Yes Historical Provider, MD  sevelamer carbonate (RENVELA) 800 MG tablet Take 800 mg by mouth 4 (four) times daily - after meals and at bedtime.   Yes Historical Provider, MD  sulfamethoxazole-trimethoprim (BACTRIM,SEPTRA) 400-80 MG per tablet Take 1 tablet by mouth every Monday, Wednesday, and Friday. 07/01/13  Yes Historical Provider, MD  tacrolimus (PROGRAF) 5 MG capsule Take 10 mg by mouth 2 (two) times daily.    Yes Historical Provider, MD  valGANciclovir (VALCYTE) 450 MG tablet Take 450 mg by mouth every Monday, Wednesday, and Friday.   Yes Historical Provider, MD    Current Facility-Administered Medications  Medication Dose Route Frequency Provider Last Rate Last Dose  . 0.9 %  sodium chloride infusion   Intravenous Continuous [name], NP      .  allopurinol (ZYLOPRIM) tablet 100 mg  100 mg Oral Daily [name], NP   100 mg at 09/16/13 2203  . amLODipine (NORVASC) tablet 10 mg  10 mg Oral Daily [name], NP   10 mg at 09/16/13 2203  . cinacalcet (SENSIPAR) tablet 30 mg  30 mg Oral Q breakfast [name], NP      . cloNIDine (CATAPRES) tablet 0.2 mg  0.2 mg Oral BID [name], NP   0.2 mg at 09/16/13 2202  . furosemide (LASIX) tablet 80 mg  80 mg Oral Daily [name], NP      . heparin injection 5,000 Units  5,000 Units Subcutaneous 3 times per day [name], NP   5,000 Units at 09/17/13 0600  . hydrALAZINE (APRESOLINE) injection 20  mg  20 mg Intravenous Q4H PRN [name], NP      . labetalol (NORMODYNE) tablet 200 mg  200 mg Oral TID [name], NP   200 mg at 09/16/13 2203  . lisinopril (PRINIVIL,ZESTRIL) tablet 10 mg  10 mg Oral Daily [name], NP      . mycophenolate (CELLCEPT) capsule 500 mg  500 mg Oral BID Collene Gobble, MD   500 mg at 09/16/13 2202  . nitroGLYCERIN 0.2 mg/mL in dextrose 5 % infusion  2-200 mcg/min Intravenous Titrated Carmin Muskrat, MD 30 mL/hr at 09/17/13 0210 100 mcg/min at 09/17/13 0210  . pantoprazole (PROTONIX) injection 40 mg  40 mg Intravenous QHS [name], NP   40 mg at 09/16/13 2210  . predniSONE (DELTASONE) tablet 20 mg  20 mg Oral Q breakfast [name], NP      . sevelamer carbonate (RENVELA) tablet 800 mg  800 mg Oral TID WC & HS [name], NP   800 mg at 09/16/13 2300  . sulfamethoxazole-trimethoprim (BACTRIM,SEPTRA) 400-80 MG per tablet 1 tablet  1 tablet Oral Q M,W,F [name], NP      . tacrolimus (PROGRAF) capsule 10 mg  10 mg Oral BID Collene Gobble, MD   10 mg at 09/16/13 2202  . valGANciclovir (VALCYTE) 450 MG tablet TABS 450 mg  450 mg Oral Q M,W,F [name], NP        Allergies as of 09/16/2013 - Review Complete 09/16/2013  Allergen Reaction Noted  . Ferrlecit [na ferric gluc cplx in sucrose] Shortness Of Breath, Swelling, and Other (See Comments) 06/24/2013  . Darvocet [propoxyphene n-acetaminophen] Hives 10/16/2011    No family history on file.  History   Social History  . Marital Status: Married    Spouse Name: N/A    Number of Children: 3  . Years of Education: [employer]   Occupational History  . Not on file.   Social History Main Topics  . Smoking status: Former Smoker -- 1.00 packs/day for 1 years    Types: Cigarettes  . Smokeless tobacco: Never Used     Comment: quit Jan 2014  . Alcohol Use: No  . Drug Use: No  . Sexual Activity: Not on file   Other Topics Concern  . Not on file   Social History  Narrative   Owns own plumbing company    Review of Systems: Gen: Denies any fever, chills, sweats, anorexia, fatigue, weakness, malaise, weight loss, and sleep disorder HEENT: No visual complaints, No history of Retinopathy. Normal external appearance No Epistaxis or Sore throat. No sinusitis.   CV: Denies chest pain, angina,  palpitations, syncope, orthopnea, PND, peripheral edema, and claudication. Resp: Dyspneic at rest  GI: Denies vomiting blood, jaundice, and fecal incontinence.   Denies dysphagia or odynophagia. GU : Denies urinary burning, blood in urine, urinary frequency, urinary hesitancy, nocturnal urination, and urinary incontinence.  No renal calculi. MS: Denies joint pain, limitation of movement, and swelling, stiffness, low back pain, extremity pain. Denies muscle weakness, cramps, atrophy.  No use of non steroidal antiinflammatory drugs. Derm: Denies rash, itching, dry skin, hives, moles, warts, or unhealing ulcers.  Psych: Denies depression, anxiety, memory loss, suicidal ideation, hallucinations, paranoia, and confusion. Heme: Denies bruising, bleeding, and enlarged lymph nodes. Neuro: No headache.  No diplopia. No dysarthria.  No dysphasia.  No history of CVA.  No Seizures. No paresthesias.  No weakness. Endocrine No DM.  No Thyroid disease.  No Adrenal disease.  Physical Exam: Vital signs in last 24 hours: Temp:  [97.5 F (36.4 C)-98.5 F (36.9 C)] 97.6 F (36.4 C) (02/27 0713) Pulse Rate:  [78-90] 86 (02/27 0830) Resp:  [0-28] 20 (02/27 0830) BP: (150-205)/(103-150) 167/104 mmHg (02/27 0830) SpO2:  [90 %-100 %] 98 % (02/27 0830) Weight:  [79.2 kg (174 lb 9.7 oz)-79.9 kg (176 lb 2.4 oz)] 79.2 kg (174 lb 9.7 oz) (02/27 0713)   General:   Chronically ill  Head:  Normocephalic and atraumatic. Eyes:  Sclera clear, no icterus.   Conjunctiva pink. Ears:  Normal auditory acuity. Nose:  No deformity, discharge,  or lesions. Mouth:  No deformity or lesions, dentition  normal. Neck:  Supple; no masses or thyromegaly. JVP not elevated Lungs:  Rales and rhonchi   Right IJ Heart:  Regular rate and rhythm; no murmurs, clicks, rubs,  or gallops. Abdomen:  Soft, nontender and nondistended. No masses, hepatosplenomegaly or hernias noted. Normal bowel sounds, without guarding, and without rebound.   Msk:  Symmetrical without gross deformities. Normal posture. Pulses:  No carotid, renal, femoral bruits. DP and PT symmetrical and equal Extremities:  Without clubbing or edema. Neurologic:  Alert and  oriented x4;  grossly normal neurologically. Skin:  Intact without significant lesions or rashes.   Intake/Output from previous day: 02/26 0701 - 02/27 0700 In: 609.8 [P.O.:240; I.V.:369.8] Out: 250 [Urine:250] Intake/Output this shift:    Lab Results:  Recent Labs  09/16/13 1530 09/17/13 0245  WBC 5.7 6.8  HGB 10.0* 9.2*  HCT 33.2* 29.1*  PLT 253 235   BMET  Recent Labs  09/16/13 1530 09/17/13 0245  NA 139 139  K 4.9 4.7  CL 100 102  CO2 18* 20  GLUCOSE 129* 100*  BUN 63* 69*  CREATININE 11.86* 12.67*  CALCIUM 9.4 8.9   LFT No results found for this basename: PROT, ALBUMIN, AST, ALT, ALKPHOS, BILITOT, BILIDIR, IBILI,  in the last 72 hours PT/INR No results found for this basename: LABPROT, INR,  in the last 72 hours Hepatitis Panel No results found for this basename: HEPBSAG, HCVAB, HEPAIGM, HEPBIGM,  in the last 72 hours  Studies/Results: Dg Chest Port 1 View  09/16/2013   CLINICAL DATA:  Chest pain and shortness of breath.  Weakness.  EXAM: PORTABLE CHEST - 1 VIEW  COMPARISON:  PA and lateral chest 09/11/2013.  FINDINGS: There is cardiomegaly and interstitial pulmonary edema. There also appears to be a small left pleural effusion. No pneumothorax.  IMPRESSION: Cardiomegaly and pulmonary edema. Likely small effusion on the left noted.   Electronically Signed   By: Inge Rise M.D.   On: 09/16/2013 16:06  Assessment/Plan:  ESRD  patient with failed renal transplant admitted with shortness of breath at rest after missing dilaysis TTS  Undergoing dialysis at present.  HTN will aim for fluid removal  Anemia will need ESA and iron  Bones calcium and phosphorus takes cinacalcet  Access dialysis catheter  Immunosuppression quite high doses still despite having lost allograft  Will recommend referral to [facility] to modulate transplant medications. High risk of infections and also malignancy.     LOS: 1 Cecely Rengel W _0 _1 :52 AM

## 2013-09-17 NOTE — Progress Notes (Signed)
Drug-Drug Interaction Report  Trimethoprim / ACE Inhibitors  Significance: Moderate  Warning: Hyperkalemia, possibly with cardiac arrhythmias or cardiac arrest, may occur with the combination of sulfamethoxazole-trimethoprim and lisinopril. Serum potassium concentrations should be monitored. Patients receiving high dose sulfamethoxazole-trimethoprim or elderly patients with age-related renal dysfunction are at increased risk.  Onset: Delayed Document Level: Suspected  Interacting Medications/Orders:  Trimethoprim Oral or Non-Oral, Systemic ACE Inhibitors Oral or Non-Oral, Systemic  1. sulfamethoxazole-trimethoprim Order ([id number]): sulfamethoxazole-trimethoprim (BACTRIM,SEPTRA) 400-80 MG per tablet 1 tablet Route: Oral Start: 09/17/2013 1000 End: none Frequency: Every M-W-F 1. lisinopril Order ([id number]): lisinopril (PRINIVIL,ZESTRIL) tablet 20 mg Route: Oral Start: 09/17/2013 1000 End: none Frequency: 2 times daily   Management Code: Professional review suggested  Effects: Hyperkalemia, possibly with cardiac arrhythmias or cardiac arrest, may occur with the combination of sulfamethoxazole-trimethoprim and lisinopril. Serum potassium concentrations should be monitored.  Mechanism: Reduction of aldosterone activity by lisinopril may act, at least additively, with sulfamethoxazole-trimethoprim to decrease potassium excretion producing hyperkalemia.  Assessment: Current potassium level is 4.7 - he will need close monitoring with the additional ACE inhibitor.    Recommendation: May consider alternative therapy for hypertension if you feel he is at risk for hyperkalemia given renal disease and drug/drug combinations.  Rober Minion, PharmD., MS Clinical Pharmacist Pager:  [phone] Thank you for allowing pharmacy to be part of this patients care team.

## 2013-09-17 NOTE — Progress Notes (Signed)
Pt admitted to room 6E15 , a/o  VSS , voices no complaints at this time oriented to room call bell ,etc.

## 2013-09-17 NOTE — Progress Notes (Signed)
UR Completed.  Vergie Living [phone] 09/17/2013

## 2013-09-17 NOTE — Procedures (Signed)
I have seen and examined this patient and agree with the plan of care. Seen on dialysis [facility] W 09/17/2013, 9:44 AM

## 2013-09-18 DIAGNOSIS — N2581 Secondary hyperparathyroidism of renal origin: Secondary | ICD-10-CM

## 2013-09-18 DIAGNOSIS — I10 Essential (primary) hypertension: Secondary | ICD-10-CM

## 2013-09-18 DIAGNOSIS — E872 Acidosis, unspecified: Secondary | ICD-10-CM

## 2013-09-18 DIAGNOSIS — G8929 Other chronic pain: Secondary | ICD-10-CM

## 2013-09-18 LAB — BASIC METABOLIC PANEL
BUN: 35 mg/dL — ABNORMAL HIGH (ref 6–23)
CO2: 22 mEq/L (ref 19–32)
Calcium: 8.4 mg/dL (ref 8.4–10.5)
Chloride: 97 mEq/L (ref 96–112)
Creatinine, Ser: 8.13 mg/dL — ABNORMAL HIGH (ref 0.50–1.35)
GFR calc Af Amer: 8 mL/min — ABNORMAL LOW (ref >90)
GFR, EST NON AFRICAN AMERICAN: 7 mL/min — AB (ref >90)
Glucose, Bld: 136 mg/dL — ABNORMAL HIGH (ref 70–99)
POTASSIUM: 4.4 meq/L (ref 3.7–5.3)
SODIUM: 135 meq/L — AB (ref 137–147)

## 2013-09-18 LAB — CBC
HCT: 31.3 % — ABNORMAL LOW (ref 39.0–52.0)
HEMOGLOBIN: 10.1 g/dL — AB (ref 13.0–17.0)
MCH: 26 pg (ref 26.0–34.0)
MCHC: 32.3 g/dL (ref 30.0–36.0)
MCV: 80.7 fL (ref 78.0–100.0)
Platelets: 211 10*3/uL (ref 150–400)
RBC: 3.88 MIL/uL — ABNORMAL LOW (ref 4.22–5.81)
RDW: 18.2 % — ABNORMAL HIGH (ref 11.5–15.5)
WBC: 12 10*3/uL — ABNORMAL HIGH (ref 4.0–10.5)

## 2013-09-18 MED ORDER — SODIUM CHLORIDE 0.9 % IV SOLN: 100.0000 mL | INTRAVENOUS | Status: DC | PRN

## 2013-09-18 MED ORDER — HEPARIN SODIUM (PORCINE) 1000 UNIT/ML DIALYSIS
1000.0000 [IU] | INTRAMUSCULAR | Status: DC | PRN
  Filled 2013-09-18: qty 1

## 2013-09-18 MED ORDER — LIDOCAINE-PRILOCAINE 2.5-2.5 % EX CREA: 1.0000 "application " | TOPICAL_CREAM | CUTANEOUS | Status: DC | PRN

## 2013-09-18 MED ORDER — LIDOCAINE HCL (PF) 1 % IJ SOLN: 5.0000 mL | INTRAMUSCULAR | Status: DC | PRN

## 2013-09-18 MED ORDER — LABETALOL HCL 300 MG PO TABS
300.0000 mg | ORAL_TABLET | Freq: Two times a day (BID) | ORAL | Status: DC
  Administered 2013-09-18 – 2013-09-20 (×5): 300 mg via ORAL
  Filled 2013-09-18 (×6): qty 1

## 2013-09-18 MED ORDER — ALTEPLASE 2 MG IJ SOLR
2.0000 mg | Freq: Once | INTRAMUSCULAR | Status: AC | PRN
  Filled 2013-09-18: qty 2

## 2013-09-18 MED ORDER — PENTAFLUOROPROP-TETRAFLUOROETH EX AERO: 1.0000 "application " | INHALATION_SPRAY | CUTANEOUS | Status: DC | PRN

## 2013-09-18 MED ORDER — LORAZEPAM 0.5 MG PO TABS
0.5000 mg | ORAL_TABLET | Freq: Two times a day (BID) | ORAL | Status: DC | PRN
  Administered 2013-09-19 – 2013-09-20 (×2): 0.5 mg via ORAL
  Filled 2013-09-18 (×2): qty 1

## 2013-09-18 MED ORDER — NEPRO/CARBSTEADY PO LIQD: 237.0000 mL | ORAL | Status: DC | PRN

## 2013-09-18 NOTE — Progress Notes (Signed)
[facility]                                                                              Patient Demographics  Frank [name], is a 48 y.o. male, DOB - Jan 25, 1966, ENI:[id number]  Admit date - 09/16/2013   Admitting Physician Doree Fudge, MD  Outpatient Primary MD for the patient is Adin Hector  LOS - 2   Chief Complaint  Patient presents with  . Chest Pain  . Shortness of Breath        Assessment & Plan  Dyspnea secondary to pulmonary edema -Patient hemodialysis session due to the weather -Was initially admitted to ICU -Currently improving with dialysis, continue supplemental oxygen -Patient may also need outpatient followup with sleep apnea workup  Hypertensive emergency -Blood pressure is under better control. -Initially placed on nitroglycerin -Nephrology discontinue clonidine, will continue amlodipine, lisinopril, labetalol -Hydralazine when necessary  Chest pain -Improving, Likely secondary to pulmonary edema -Patient had recent hospitalization with acute coronary syndrome ruled out on 08/12/2013 -Will continue to monitor. -Remain negative  Elevated BNP -Likely secondary to renal failure with hypervolemia -Continue dialysis  End-stage renal disease requiring dialysis -complicated by Metabolic acidosis (mild) -Nephrology consulted -Continue cinacalcet and renvela   Nausea -Resolved -Continue antiemetic  Anemia of chronic disease -Patient hemodynamically stable, no evidence of bleed -Will continue to monitor CBC  Status post renal transplant -Continue CellCept, prednisone, and Prograf  Anxiety -Will add Ativan 0.67m BID PRN PO  Headache -continue tylenol as needed  Code Status: Full  Family Communication: None at bedside.  Disposition Plan: Admitted  Time Spent in minutes   35 minutes  Procedures  None  Consults   Nephrology PCCM, admitting  DVT Prophylaxis  Heparin   Lab Results  Component Value Date   PLT 211 09/18/2013    Medications  Scheduled Meds: . allopurinol  100 mg Oral Daily  . amLODipine  10 mg Oral Daily  . cinacalcet  30 mg Oral Q breakfast  . furosemide  80 mg Oral Daily  . heparin  5,000 Units Subcutaneous 3 times per day  . labetalol  300 mg Oral BID  . lisinopril  20 mg Oral BID  . mycophenolate  250 mg Oral BID  . pantoprazole (PROTONIX) IV  40 mg Intravenous QHS  . predniSONE  15 mg Oral Q breakfast  . sevelamer carbonate  800 mg Oral TID WC & HS  . sulfamethoxazole-trimethoprim  1 tablet Oral Q M,W,F  . tacrolimus  10 mg Oral BID  . valGANciclovir  450 mg Oral Q M,W,F   Continuous Infusions: . sodium chloride Stopped (09/16/13 2210)   PRN Meds:.sodium chloride, sodium chloride, acetaminophen, feeding supplement (NEPRO CARB STEADY), heparin, heparin, hydrALAZINE, lidocaine (PF), lidocaine-prilocaine, pentafluoroprop-tetrafluoroeth  Antibiotics    Anti-infectives   Start     Dose/Rate Route Frequency Ordered Stop   09/17/13 1000  sulfamethoxazole-trimethoprim (BACTRIM,SEPTRA) 400-80 MG per tablet 1 tablet     1 tablet Oral Every M-W-F 09/16/13 2055     09/17/13 1000  valGANciclovir (VALCYTE) 450 MG tablet TABS 450 mg     450 mg Oral Every M-W-F 09/16/13 2055  Subjective:   Frank [name] seen and examined today.  Patient states his breathing has improved since admission. He also states that his headache is though improved slightly. He denies any chest pain at this time. Patient does state he feels very anxious.  Objective:   Filed Vitals:   09/17/13 2100 09/18/13 0439 09/18/13 1000 09/18/13 1045  BP: 150/91  156/110 152/102  Pulse: 88  76 77  Temp: 98.6 F (37 C)     TempSrc: Oral     Resp: 20  23   Height:      Weight: 80.2 kg (176 lb 12.9 oz) 77.1 kg (169 lb 15.6 oz)    SpO2: 98%  97%     Wt Readings from Last 3 Encounters:  09/18/13 77.1 kg (169 lb 15.6 oz)  09/12/13 77.429 kg (170 lb 11.2 oz)  06/24/13 81.8 kg (180 lb 5.4 oz)      Intake/Output Summary (Last 24 hours) at 09/18/13 1238 Last data filed at 09/18/13 0830  Gross per 24 hour  Intake 424.38 ml  Output    100 ml  Net 324.38 ml    Exam  General: Well developed, well nourished, NAD, appears stated age  HEENT: NCAT, PERRLA, EOMI, Anicteic Sclera, mucous membranes moist.   Neck: Supple, no JVD, no masses  Cardiovascular: S1 S2 auscultated, no rubs, murmurs or gallops. Regular rate and rhythm.  Respiratory: Clear to auscultation bilaterally with equal chest rise  Abdomen: Soft, nontender, nondistended, + bowel sounds  Extremities: warm dry without cyanosis clubbing, trace LE edema  Neuro: AAOx3, cranial nerves grossly intact. Strength 5/5 in patient's upper and lower extremities bilaterally  Skin: Without rashes exudates or nodules  Psych: Normal affect and demeanor with intact judgement and insight, anxious    Data Review   Micro Results Recent Results (from the past 240 hour(s))  MRSA PCR SCREENING     Status: None   Collection Time    09/16/13  8:35 PM      Result Value Ref Range Status   MRSA by PCR NEGATIVE  NEGATIVE Final   Comment:            The GeneXpert MRSA Assay (FDA     approved for NASAL specimens     only), is one component of a     comprehensive MRSA colonization     surveillance program. It is not     intended to diagnose MRSA     infection nor to guide or     monitor treatment for     MRSA infections.    Radiology Reports Dg Chest 2 View  09/11/2013   CLINICAL DATA:  Left-sided chest pain, shortness of breath and mild nausea. History of smoking.  EXAM: CHEST  2 VIEW  COMPARISON:  Chest radiograph performed 09/07/2013  FINDINGS: The lungs are well-aerated. A small left pleural effusion is again seen. Mild vascular congestion is noted, without significant pulmonary edema. There is no evidence of pneumothorax.  The heart is mildly enlarged. A right-sided dual-lumen catheter is seen ending within the right  atrium. No acute osseous abnormalities are seen.  IMPRESSION: Small left pleural effusion again seen. Vascular congestion, without significant pulmonary edema. Mild cardiomegaly noted.   Electronically Signed   By: Garald Balding M.D.   On: 09/11/2013 22:27   Dg Chest 2 View  09/07/2013   CLINICAL DATA:  Cold symptoms.  EXAM: CHEST  2 VIEW  COMPARISON:  Chest x-ray 07/16/2013.  FINDINGS: Lung volumes are normal.  No consolidative airspace disease. Trace bilateral pleural effusions. No evidence of pulmonary edema. Heart size is mildly enlarged. Upper mediastinal contours are within normal limits. Right internal jugular PermCath with tip terminating in the superior aspect of the right atrium.  IMPRESSION: 1. Trace bilateral pleural effusions. 2. Mild cardiomegaly.   Electronically Signed   By: Vinnie Langton M.D.   On: 09/07/2013 19:26   Dg Chest Port 1 View  09/16/2013   CLINICAL DATA:  Chest pain and shortness of breath.  Weakness.  EXAM: PORTABLE CHEST - 1 VIEW  COMPARISON:  PA and lateral chest 09/11/2013.  FINDINGS: There is cardiomegaly and interstitial pulmonary edema. There also appears to be a small left pleural effusion. No pneumothorax.  IMPRESSION: Cardiomegaly and pulmonary edema. Likely small effusion on the left noted.   Electronically Signed   By: Inge Rise M.D.   On: 09/16/2013 16:06    CBC  Recent Labs Lab 09/11/13 2130 09/12/13 0506 09/16/13 1530 09/17/13 0245 09/18/13 0442  WBC 5.3 4.6 5.7 6.8 12.0*  HGB 9.6* 9.2* 10.0* 9.2* 10.1*  HCT 31.8* 30.0* 33.2* 29.1* 31.3*  PLT 239 231 253 235 211  MCV 82.0 81.3 81.8 80.4 80.7  MCH 24.7* 24.9* 24.6* 25.4* 26.0  MCHC 30.2 30.7 30.1 31.6 32.3  RDW 17.7* 17.3* 18.0* 17.8* 18.2*    Chemistries   Recent Labs Lab 09/11/13 2130 09/12/13 0506 09/16/13 1530 09/17/13 0245 09/18/13 0442  NA 140 138 139 139 135*  K 4.1 4.2 4.9 4.7 4.4  CL 99 98 100 102 97  CO2 26 26 18* 20 22  GLUCOSE 103* 140* 129* 100* 136*  BUN 36*  38* 63* 69* 35*  CREATININE 9.11* 9.62* 11.86* 12.67* 8.13*  CALCIUM 9.0 9.2 9.4 8.9 8.4   ------------------------------------------------------------------------------------------------------------------ estimated creatinine clearance is 12 ml/min (by C-G formula based on Cr of 8.13). ------------------------------------------------------------------------------------------------------------------ No results found for this basename: HGBA1C,  in the last 72 hours ------------------------------------------------------------------------------------------------------------------ No results found for this basename: CHOL, HDL, LDLCALC, TRIG, CHOLHDL, LDLDIRECT,  in the last 72 hours ------------------------------------------------------------------------------------------------------------------ No results found for this basename: TSH, T4TOTAL, FREET3, T3FREE, THYROIDAB,  in the last 72 hours ------------------------------------------------------------------------------------------------------------------ No results found for this basename: VITAMINB12, FOLATE, FERRITIN, TIBC, IRON, RETICCTPCT,  in the last 72 hours  Coagulation profile No results found for this basename: INR, PROTIME,  in the last 168 hours  No results found for this basename: DDIMER,  in the last 72 hours  Cardiac Enzymes  Recent Labs Lab 09/16/13 2140 09/17/13 0245 09/17/13 0832  TROPONINI <0.30 <0.30 <0.30   ------------------------------------------------------------------------------------------------------------------ No components found with this basename: POCBNP,     Bert Givans D.O. on 09/18/2013 at 12:38 PM  Between 7am to 7pm - Pager - [phone]  After 7pm go to [email or web address] - password [email or web address]  And look for the night coverage person covering for me after hours  [facility] Office  [phone]

## 2013-09-18 NOTE — Progress Notes (Signed)
[facility] ROUNDING NOTE   Subjective:   Interval History: Blood pressure control improved  Objective:  Vital signs in last 24 hours:  Temp:  [97.1 F (36.2 C)-98.6 F (37 C)] 98.6 F (37 C) (02/27 2100) Pulse Rate:  [81-94] 88 (02/27 2100) Resp:  [16-31] 20 (02/27 2100) BP: (138-187)/(91-125) 150/91 mmHg (02/27 2100) SpO2:  [94 %-98 %] 98 % (02/27 2100) Weight:  [77.1 kg (169 lb 15.6 oz)-80.2 kg (176 lb 12.9 oz)] 77.1 kg (169 lb 15.6 oz) (02/28 0439)  Weight change: -0.7 kg (-1 lb 8.7 oz) Filed Weights   09/17/13 1800 09/17/13 2100 09/18/13 0439  Weight: 79.2 kg (174 lb 9.7 oz) 80.2 kg (176 lb 12.9 oz) 77.1 kg (169 lb 15.6 oz)    Intake/Output: I/O last 3 completed shifts: In: 784.1 [P.O.:240; I.V.:544.1] Out: 3250 [Urine:350; Other:2900]   Intake/Output this shift:     CVS- RRR RS- CTA  Right IJ ABD- BS present soft non-distended EXT- no edema   Basic Metabolic Panel:  Recent Labs Lab 09/11/13 2130 09/12/13 0506 09/16/13 1530 09/17/13 0245 09/18/13 0442  NA 140 138 139 139 135*  K 4.1 4.2 4.9 4.7 4.4  CL 99 98 100 102 97  CO2 26 26 18* 20 22  GLUCOSE 103* 140* 129* 100* 136*  BUN 36* 38* 63* 69* 35*  CREATININE 9.11* 9.62* 11.86* 12.67* 8.13*  CALCIUM 9.0 9.2 9.4 8.9 8.4    Liver Function Tests: No results found for this basename: AST, ALT, ALKPHOS, BILITOT, PROT, ALBUMIN,  in the last 168 hours  Recent Labs Lab 09/16/13 2140  LIPASE 20   No results found for this basename: AMMONIA,  in the last 168 hours  CBC:  Recent Labs Lab 09/11/13 2130 09/12/13 0506 09/16/13 1530 09/17/13 0245 09/18/13 0442  WBC 5.3 4.6 5.7 6.8 12.0*  HGB 9.6* 9.2* 10.0* 9.2* 10.1*  HCT 31.8* 30.0* 33.2* 29.1* 31.3*  MCV 82.0 81.3 81.8 80.4 80.7  PLT 239 231 253 235 211    Cardiac Enzymes:  Recent Labs Lab 09/12/13 0913 09/12/13 1414 09/16/13 2140 09/17/13 0245 09/17/13 0832  TROPONINI <0.30 <0.30 <0.30 <0.30 <0.30    BNP: No  components found with this basename: POCBNP,   CBG:  Recent Labs Lab 09/16/13 2036 09/17/13 0013 09/17/13 0750 09/17/13 1148 09/17/13 1622  GLUCAP 101* 124* 117* 102* 109*    Microbiology: Results for orders placed during the hospital encounter of 09/16/13  MRSA PCR SCREENING     Status: None   Collection Time    09/16/13  8:35 PM      Result Value Ref Range Status   MRSA by PCR NEGATIVE  NEGATIVE Final   Comment:            The GeneXpert MRSA Assay (FDA     approved for NASAL specimens     only), is one component of a     comprehensive MRSA colonization     surveillance program. It is not     intended to diagnose MRSA     infection nor to guide or     monitor treatment for     MRSA infections.    Coagulation Studies: No results found for this basename: LABPROT, INR,  in the last 72 hours  Urinalysis: No results found for this basename: COLORURINE, APPERANCEUR, LABSPEC, PHURINE, GLUCOSEU, HGBUR, BILIRUBINUR, KETONESUR, PROTEINUR, UROBILINOGEN, NITRITE, LEUKOCYTESUR,  in the last 72 hours    Imaging: Dg Chest Port 1 View  09/16/2013  CLINICAL DATA:  Chest pain and shortness of breath.  Weakness.  EXAM: PORTABLE CHEST - 1 VIEW  COMPARISON:  PA and lateral chest 09/11/2013.  FINDINGS: There is cardiomegaly and interstitial pulmonary edema. There also appears to be a small left pleural effusion. No pneumothorax.  IMPRESSION: Cardiomegaly and pulmonary edema. Likely small effusion on the left noted.   Electronically Signed   By: Inge Rise M.D.   On: 09/16/2013 16:06     Medications:   . sodium chloride Stopped (09/16/13 2210)   . allopurinol  100 mg Oral Daily  . amLODipine  10 mg Oral Daily  . cinacalcet  30 mg Oral Q breakfast  . furosemide  80 mg Oral Daily  . heparin  5,000 Units Subcutaneous 3 times per day  . labetalol  300 mg Oral BID  . lisinopril  20 mg Oral BID  . mycophenolate  250 mg Oral BID  . pantoprazole (PROTONIX) IV  40 mg Intravenous QHS   . predniSONE  15 mg Oral Q breakfast  . sevelamer carbonate  800 mg Oral TID WC & HS  . sulfamethoxazole-trimethoprim  1 tablet Oral Q M,W,F  . tacrolimus  10 mg Oral BID  . valGANciclovir  450 mg Oral Q M,W,F   sodium chloride, sodium chloride, acetaminophen, feeding supplement (NEPRO CARB STEADY), heparin, heparin, hydrALAZINE, lidocaine (PF), lidocaine-prilocaine, pentafluoroprop-tetrafluoroeth  Assessment/ Plan:  ESRD patient with failed renal transplant admitted with shortness of breath at rest after missing dilaysis TTS Undergoing dialysis at present. Will dialyize today HTN will aim for fluid removal  Stop clonidine and increase labetalol Anemia will need ESA and iron  Bones calcium and phosphorus takes cinacalcet  Access dialysis catheter  Immunosuppression quite high doses still despite having lost allograft Will recommend referral to [facility] to modulate transplant medications. High risk of infections and also malignancy.      LOS: 2 Ajahni Nay W _0 _1 :47 AM

## 2013-09-18 NOTE — Progress Notes (Signed)
Report received,  patient is presently in Hemodialysis Dept receiving treatment and was not seen.

## 2013-09-19 DIAGNOSIS — D631 Anemia in chronic kidney disease: Secondary | ICD-10-CM

## 2013-09-19 DIAGNOSIS — N189 Chronic kidney disease, unspecified: Secondary | ICD-10-CM

## 2013-09-19 DIAGNOSIS — N039 Chronic nephritic syndrome with unspecified morphologic changes: Secondary | ICD-10-CM

## 2013-09-19 DIAGNOSIS — F329 Major depressive disorder, single episode, unspecified: Secondary | ICD-10-CM

## 2013-09-19 DIAGNOSIS — R112 Nausea with vomiting, unspecified: Secondary | ICD-10-CM

## 2013-09-19 DIAGNOSIS — F3289 Other specified depressive episodes: Secondary | ICD-10-CM

## 2013-09-19 LAB — BASIC METABOLIC PANEL
BUN: 24 mg/dL — ABNORMAL HIGH (ref 6–23)
CHLORIDE: 100 meq/L (ref 96–112)
CO2: 26 mEq/L (ref 19–32)
Calcium: 8.5 mg/dL (ref 8.4–10.5)
Creatinine, Ser: 6.11 mg/dL — ABNORMAL HIGH (ref 0.50–1.35)
GFR calc Af Amer: 11 mL/min — ABNORMAL LOW (ref >90)
GFR calc non Af Amer: 10 mL/min — ABNORMAL LOW (ref >90)
Glucose, Bld: 123 mg/dL — ABNORMAL HIGH (ref 70–99)
POTASSIUM: 3.9 meq/L (ref 3.7–5.3)
Sodium: 139 mEq/L (ref 137–147)

## 2013-09-19 LAB — CBC
HEMATOCRIT: 30.9 % — AB (ref 39.0–52.0)
Hemoglobin: 10 g/dL — ABNORMAL LOW (ref 13.0–17.0)
MCH: 26 pg (ref 26.0–34.0)
MCHC: 32.4 g/dL (ref 30.0–36.0)
MCV: 80.5 fL (ref 78.0–100.0)
Platelets: 188 10*3/uL (ref 150–400)
RBC: 3.84 MIL/uL — ABNORMAL LOW (ref 4.22–5.81)
RDW: 18 % — AB (ref 11.5–15.5)
WBC: 7.9 10*3/uL (ref 4.0–10.5)

## 2013-09-19 MED ORDER — LISINOPRIL 40 MG PO TABS
40.0000 mg | ORAL_TABLET | Freq: Two times a day (BID) | ORAL | Status: DC
  Administered 2013-09-19 – 2013-09-20 (×3): 40 mg via ORAL
  Filled 2013-09-19 (×4): qty 1

## 2013-09-19 MED ORDER — MINOXIDIL 2.5 MG PO TABS
2.5000 mg | ORAL_TABLET | Freq: Every day | ORAL | Status: DC
  Administered 2013-09-19 – 2013-09-20 (×2): 2.5 mg via ORAL
  Filled 2013-09-19 (×2): qty 1

## 2013-09-19 NOTE — Progress Notes (Signed)
[facility] ROUNDING NOTE   Subjective:   Interval History: Still elevated blood pressure  Objective:  Vital signs in last 24 hours:  Temp:  [97.7 F (36.5 C)-98.3 F (36.8 C)] 97.8 F (36.6 C) (03/01 0839) Pulse Rate:  [76-87] 78 (03/01 0839) Resp:  [16-20] 18 (03/01 0839) BP: (150-180)/(98-118) 177/116 mmHg (03/01 0839) SpO2:  [95 %-99 %] 95 % (03/01 0839) Weight:  [71.1 kg (156 lb 12 oz)-76.6 kg (168 lb 14 oz)] 71.1 kg (156 lb 12 oz) (02/28 2135)  Weight change: -2.6 kg (-5 lb 11.7 oz) Filed Weights   09/18/13 0439 09/18/13 1819 09/18/13 2135  Weight: 77.1 kg (169 lb 15.6 oz) 76.6 kg (168 lb 14 oz) 71.1 kg (156 lb 12 oz)    Intake/Output: I/O last 3 completed shifts: In: 385 [P.O.:385] Out: 2500 [Other:2500]   Intake/Output this shift:     CVS- RRR RS- CTA right IJ ABD- BS present soft non-distended EXT- no edema   Basic Metabolic Panel:  Recent Labs Lab 09/16/13 1530 09/17/13 0245 09/18/13 0442 09/19/13 0650  NA 139 139 135* 139  K 4.9 4.7 4.4 3.9  CL 100 102 97 100  CO2 18* _0 GLUCOSE 129* 100* 136* 123*  BUN 63* 69* 35* 24*  CREATININE 11.86* 12.67* 8.13* 6.11*  CALCIUM 9.4 8.9 8.4 8.5    Liver Function Tests: No results found for this basename: AST, ALT, ALKPHOS, BILITOT, PROT, ALBUMIN,  in the last 168 hours  Recent Labs Lab 09/16/13 2140  LIPASE 20   No results found for this basename: AMMONIA,  in the last 168 hours  CBC:  Recent Labs Lab 09/16/13 1530 09/17/13 0245 09/18/13 0442 09/19/13 0650  WBC 5.7 6.8 12.0* 7.9  HGB 10.0* 9.2* 10.1* 10.0*  HCT 33.2* 29.1* 31.3* 30.9*  MCV 81.8 80.4 80.7 80.5  PLT 253 235 211 188    Cardiac Enzymes:  Recent Labs Lab 09/12/13 1414 09/16/13 2140 09/17/13 0245 09/17/13 0832  TROPONINI <0.30 <0.30 <0.30 <0.30    BNP: No components found with this basename: POCBNP,   CBG:  Recent Labs Lab 09/16/13 2036 09/17/13 0013 09/17/13 0750 09/17/13 1148  09/17/13 1622  GLUCAP 101* 124* 117* 102* 109*    Microbiology: Results for orders placed during the hospital encounter of 09/16/13  MRSA PCR SCREENING     Status: None   Collection Time    09/16/13  8:35 PM      Result Value Ref Range Status   MRSA by PCR NEGATIVE  NEGATIVE Final   Comment:            The GeneXpert MRSA Assay (FDA     approved for NASAL specimens     only), is one component of a     comprehensive MRSA colonization     surveillance program. It is not     intended to diagnose MRSA     infection nor to guide or     monitor treatment for     MRSA infections.    Coagulation Studies: No results found for this basename: LABPROT, INR,  in the last 72 hours  Urinalysis: No results found for this basename: COLORURINE, APPERANCEUR, LABSPEC, PHURINE, GLUCOSEU, HGBUR, BILIRUBINUR, KETONESUR, PROTEINUR, UROBILINOGEN, NITRITE, LEUKOCYTESUR,  in the last 72 hours    Imaging: No results found.   Medications:   . sodium chloride Stopped (09/16/13 2210)   . allopurinol  100 mg Oral Daily  . amLODipine  10 mg Oral Daily  .  cinacalcet  30 mg Oral Q breakfast  . furosemide  80 mg Oral Daily  . heparin  5,000 Units Subcutaneous 3 times per day  . labetalol  300 mg Oral BID  . lisinopril  40 mg Oral BID  . minoxidil  2.5 mg Oral Daily  . mycophenolate  250 mg Oral BID  . pantoprazole (PROTONIX) IV  40 mg Intravenous QHS  . predniSONE  15 mg Oral Q breakfast  . sevelamer carbonate  800 mg Oral TID WC & HS  . sulfamethoxazole-trimethoprim  1 tablet Oral Q M,W,F  . tacrolimus  10 mg Oral BID  . valGANciclovir  450 mg Oral Q M,W,F   sodium chloride, sodium chloride, acetaminophen, feeding supplement (NEPRO CARB STEADY), heparin, hydrALAZINE, lidocaine (PF), lidocaine-prilocaine, LORazepam, pentafluoroprop-tetrafluoroeth    Assessment/ Plan:   ESRD patient with failed renal transplant admitted with shortness of breath at rest after missing dilaysis TTS Undergoing  dialysis at present.  HTN will aim for fluid removal and increase labetalol  Add minoxidil 2.62m once a day  Anemia will need ESA and iron  Bones calcium and phosphorus takes cinacalcet  Access dialysis catheter  Immunosuppression quite high doses still despite having lost allograft [facility]. High risk of infections and also malignancy    LOS: 3 Austin Pongratz W _0 _1 :18 AM

## 2013-09-19 NOTE — Progress Notes (Signed)
Ativan 0.5 mg administered for anxiety. We will continue to monitor.

## 2013-09-19 NOTE — Progress Notes (Signed)
[facility]                                                                              Patient Demographics  Frank [name], is a 48 y.o. male, DOB - Apr 05, 1966, ZGY:[id number]  Admit date - 09/16/2013   Admitting Physician Doree Fudge, MD  Outpatient Primary MD for the patient is Adin Hector  LOS - 3   Chief Complaint  Patient presents with  . Chest Pain  . Shortness of Breath        Assessment & Plan  Dyspnea secondary to pulmonary edema -Patient hemodialysis session due to the weather -Was initially admitted to ICU -Currently improving with dialysis, continue supplemental oxygen -Patient may also need outpatient followup with sleep apnea workup  Hypertensive emergency -Blood pressure is under better control. -Initially placed on nitroglycerin -Nephrology discontinue clonidine, will continue amlodipine, lisinopril, labetalol -Will increase lisinopril to 73m BID. -Hydralazine when necessary  Chest pain -Improving, Likely secondary to pulmonary edema -Patient had recent hospitalization with acute coronary syndrome ruled out on 08/12/2013 -Will continue to monitor. -Remain negative  Elevated BNP -Likely secondary to renal failure with hypervolemia -Continue dialysis  End-stage renal disease requiring dialysis -complicated by Metabolic acidosis (mild) -Nephrology consulted -Continue cinacalcet and renvela   Nausea -Resolved -Continue antiemetic  Anemia of chronic disease -Patient hemodynamically stable, no evidence of bleed -Will continue to monitor CBC  Status post renal transplant -Continue CellCept, prednisone, and Prograf  Anxiety -Will add Ativan 0.557mBID PRN PO  Headache -continue tylenol as needed  Code Status: Full  Family Communication: None at bedside.  Disposition Plan: Admitted  Time Spent in minutes   25 minutes  Procedures  None  Consults   Nephrology PCCM, admitting  DVT Prophylaxis  Heparin   Lab Results  Component Value Date   PLT 188 09/19/2013    Medications  Scheduled Meds: . allopurinol  100 mg Oral Daily  . amLODipine  10 mg Oral Daily  . cinacalcet  30 mg Oral Q breakfast  . furosemide  80 mg Oral Daily  . heparin  5,000 Units Subcutaneous 3 times per day  . labetalol  300 mg Oral BID  . lisinopril  20 mg Oral BID  . mycophenolate  250 mg Oral BID  . pantoprazole (PROTONIX) IV  40 mg Intravenous QHS  . predniSONE  15 mg Oral Q breakfast  . sevelamer carbonate  800 mg Oral TID WC & HS  . sulfamethoxazole-trimethoprim  1 tablet Oral Q M,W,F  . tacrolimus  10 mg Oral BID  . valGANciclovir  450 mg Oral Q M,W,F   Continuous Infusions: . sodium chloride Stopped (09/16/13 2210)   PRN Meds:.sodium chloride, sodium chloride, acetaminophen, feeding supplement (NEPRO CARB STEADY), heparin, hydrALAZINE, lidocaine (PF), lidocaine-prilocaine, LORazepam, pentafluoroprop-tetrafluoroeth  Antibiotics    Anti-infectives   Start     Dose/Rate Route Frequency Ordered Stop   09/17/13 1000  sulfamethoxazole-trimethoprim (BACTRIM,SEPTRA) 400-80 MG per tablet 1 tablet     1 tablet Oral Every M-W-F 09/16/13 2055     09/17/13 1000  valGANciclovir (VALCYTE) 450 MG tablet TABS 450 mg     450 mg Oral Every M-W-F 09/16/13 2055  Subjective:   Frank [name] seen and examined today.  Patient states his breathing has improved.  He continues to have a headache which he thinks it is due to his blood pressure which he continues to worry about.   Objective:   Filed Vitals:   09/18/13 2100 09/18/13 2135 09/18/13 2149 09/19/13 0500  BP: 161/101 162/99 167/100 150/101  Pulse: 81 83 87 79  Temp:  98.3 F (36.8 C) 97.9 F (36.6 C) 98.2 F (36.8 C)  TempSrc:  Oral Oral Oral  Resp: _0 Height:      Weight:  71.1 kg (156 lb 12 oz)    SpO2:  96% 99% 98%    Wt Readings from Last 3 Encounters:  09/18/13 71.1 kg (156 lb 12 oz)  09/12/13 77.429 kg (170 lb 11.2 oz)    06/24/13 81.8 kg (180 lb 5.4 oz)     Intake/Output Summary (Last 24 hours) at 09/19/13 0355 Last data filed at 09/18/13 2135  Gross per 24 hour  Intake      0 ml  Output   2500 ml  Net  -2500 ml    Exam  General: Well developed, well nourished, NAD, appears stated age  HEENT: NCAT, PERRLA, EOMI, Anicteic Sclera, mucous membranes moist.   Neck: Supple, no JVD, no masses  Cardiovascular: S1 S2 auscultated, No murmurs, Regular rate and rhythm.  Respiratory: Clear to auscultation bilaterally with equal chest rise  Abdomen: Soft, nontender, nondistended, + bowel sounds  Extremities: warm dry without cyanosis clubbing, trace LE edema  Neuro: AAOx3, no focal deficits  Skin: Without rashes exudates or nodules  Psych: Normal affect and demeanor with intact judgement and insight, anxious    Data Review   Micro Results Recent Results (from the past 240 hour(s))  MRSA PCR SCREENING     Status: None   Collection Time    09/16/13  8:35 PM      Result Value Ref Range Status   MRSA by PCR NEGATIVE  NEGATIVE Final   Comment:            The GeneXpert MRSA Assay (FDA     approved for NASAL specimens     only), is one component of a     comprehensive MRSA colonization     surveillance program. It is not     intended to diagnose MRSA     infection nor to guide or     monitor treatment for     MRSA infections.    Radiology Reports Dg Chest 2 View  09/11/2013   CLINICAL DATA:  Left-sided chest pain, shortness of breath and mild nausea. History of smoking.  EXAM: CHEST  2 VIEW  COMPARISON:  Chest radiograph performed 09/07/2013  FINDINGS: The lungs are well-aerated. A small left pleural effusion is again seen. Mild vascular congestion is noted, without significant pulmonary edema. There is no evidence of pneumothorax.  The heart is mildly enlarged. A right-sided dual-lumen catheter is seen ending within the right atrium. No acute osseous abnormalities are seen.  IMPRESSION: Small  left pleural effusion again seen. Vascular congestion, without significant pulmonary edema. Mild cardiomegaly noted.   Electronically Signed   By: Garald Balding M.D.   On: 09/11/2013 22:27   Dg Chest 2 View  09/07/2013   CLINICAL DATA:  Cold symptoms.  EXAM: CHEST  2 VIEW  COMPARISON:  Chest x-ray 07/16/2013.  FINDINGS: Lung volumes are normal. No consolidative airspace disease. Trace bilateral pleural effusions. No  evidence of pulmonary edema. Heart size is mildly enlarged. Upper mediastinal contours are within normal limits. Right internal jugular PermCath with tip terminating in the superior aspect of the right atrium.  IMPRESSION: 1. Trace bilateral pleural effusions. 2. Mild cardiomegaly.   Electronically Signed   By: Vinnie Langton M.D.   On: 09/07/2013 19:26   Dg Chest Port 1 View  09/16/2013   CLINICAL DATA:  Chest pain and shortness of breath.  Weakness.  EXAM: PORTABLE CHEST - 1 VIEW  COMPARISON:  PA and lateral chest 09/11/2013.  FINDINGS: There is cardiomegaly and interstitial pulmonary edema. There also appears to be a small left pleural effusion. No pneumothorax.  IMPRESSION: Cardiomegaly and pulmonary edema. Likely small effusion on the left noted.   Electronically Signed   By: Inge Rise M.D.   On: 09/16/2013 16:06    CBC  Recent Labs Lab 09/16/13 1530 09/17/13 0245 09/18/13 0442 09/19/13 0650  WBC 5.7 6.8 12.0* 7.9  HGB 10.0* 9.2* 10.1* 10.0*  HCT 33.2* 29.1* 31.3* 30.9*  PLT 253 235 211 188  MCV 81.8 80.4 80.7 80.5  MCH 24.6* 25.4* 26.0 26.0  MCHC 30.1 31.6 32.3 32.4  RDW 18.0* 17.8* 18.2* 18.0*    Chemistries   Recent Labs Lab 09/16/13 1530 09/17/13 0245 09/18/13 0442 09/19/13 0650  NA 139 139 135* 139  K 4.9 4.7 4.4 3.9  CL 100 102 97 100  CO2 18* _0 GLUCOSE 129* 100* 136* 123*  BUN 63* 69* 35* 24*  CREATININE 11.86* 12.67* 8.13* 6.11*  CALCIUM 9.4 8.9 8.4 8.5    ------------------------------------------------------------------------------------------------------------------ estimated creatinine clearance is 14.7 ml/min (by C-G formula based on Cr of 6.11). ------------------------------------------------------------------------------------------------------------------ No results found for this basename: HGBA1C,  in the last 72 hours ------------------------------------------------------------------------------------------------------------------ No results found for this basename: CHOL, HDL, LDLCALC, TRIG, CHOLHDL, LDLDIRECT,  in the last 72 hours ------------------------------------------------------------------------------------------------------------------ No results found for this basename: TSH, T4TOTAL, FREET3, T3FREE, THYROIDAB,  in the last 72 hours ------------------------------------------------------------------------------------------------------------------ No results found for this basename: VITAMINB12, FOLATE, FERRITIN, TIBC, IRON, RETICCTPCT,  in the last 72 hours  Coagulation profile No results found for this basename: INR, PROTIME,  in the last 168 hours  No results found for this basename: DDIMER,  in the last 72 hours  Cardiac Enzymes  Recent Labs Lab 09/16/13 2140 09/17/13 0245 09/17/13 0832  TROPONINI <0.30 <0.30 <0.30   ------------------------------------------------------------------------------------------------------------------ No components found with this basename: POCBNP,     Frank [name] D.O. on 09/19/2013 at 8:32 AM  Between 7am to 7pm - Pager - [phone]  After 7pm go to [email or web address] - password [email or web address]  And look for the night coverage person covering for me after hours  [facility] Office  [phone]

## 2013-09-20 DIAGNOSIS — E875 Hyperkalemia: Secondary | ICD-10-CM

## 2013-09-20 LAB — BASIC METABOLIC PANEL
BUN: 38 mg/dL — ABNORMAL HIGH (ref 6–23)
CHLORIDE: 95 meq/L — AB (ref 96–112)
CO2: 24 meq/L (ref 19–32)
CREATININE: 7.92 mg/dL — AB (ref 0.50–1.35)
Calcium: 8.2 mg/dL — ABNORMAL LOW (ref 8.4–10.5)
GFR calc Af Amer: 8 mL/min — ABNORMAL LOW (ref >90)
GFR calc non Af Amer: 7 mL/min — ABNORMAL LOW (ref >90)
Glucose, Bld: 112 mg/dL — ABNORMAL HIGH (ref 70–99)
Potassium: 4.3 mEq/L (ref 3.7–5.3)
Sodium: 133 mEq/L — ABNORMAL LOW (ref 137–147)

## 2013-09-20 MED ORDER — PREDNISONE 10 MG PO TABS
10.0000 mg | ORAL_TABLET | Freq: Every day | ORAL | Status: DC
  Filled 2013-09-20: qty 1

## 2013-09-20 MED ORDER — TACROLIMUS 1 MG PO CAPS: 2.0000 mg | ORAL_CAPSULE | Freq: Two times a day (BID) | ORAL | Status: DC

## 2013-09-20 MED ORDER — PANTOPRAZOLE SODIUM 40 MG PO TBEC: 40.0000 mg | DELAYED_RELEASE_TABLET | Freq: Every day | ORAL | Status: DC

## 2013-09-20 MED ORDER — CLONIDINE HCL 0.2 MG PO TABS
0.2000 mg | ORAL_TABLET | Freq: Two times a day (BID) | ORAL | Status: DC
  Administered 2013-09-20: 0.2 mg via ORAL
  Filled 2013-09-20 (×2): qty 1

## 2013-09-20 NOTE — Progress Notes (Signed)
[facility]                                                                              Patient Demographics  Frank [name], is a 48 y.o. male, DOB - [birth year], KPT:[id number]  Admit date - 09/16/2013   Admitting Physician Doree Fudge, MD  Outpatient Primary MD for the patient is Adin Hector  LOS - 4   Chief Complaint  Patient presents with  . Chest Pain  . Shortness of Breath        Assessment & Plan  Dyspnea secondary to pulmonary edema -Patient hemodialysis session due to the weather -Was initially admitted to ICU -Currently improving with dialysis, continue supplemental oxygen -Patient may also need outpatient followup with sleep apnea workup  Hypertensive emergency -Blood pressure is under better control. -Initially placed on nitroglycerin -Continue amlodipine, lisinopril, labetalol -Will restart clonidine, nephrology discontinuing minoxidil -Hydralazine when necessary  Chest pain -Improving, Likely secondary to pulmonary edema -Patient had recent hospitalization with acute coronary syndrome ruled out on 08/12/2013 -Will continue to monitor.  Elevated BNP -Likely secondary to renal failure with hypervolemia -Continue dialysis  End-stage renal disease requiring dialysis -complicated by Metabolic acidosis (mild) -Nephrology consulted -Continue cinacalcet and renvela   Nausea -Resolved -Continue antiemetic  Anemia of chronic disease -Patient hemodynamically stable, no evidence of bleed -Continue to monitor CBC  Status post renal transplant -Continue CellCept, prednisone, and Prograf -Nephrology will decrease prograf  Anxiety -Continue Ativan 0.64m BID PRN PO  Headache -continue tylenol as needed  Code Status: Full  Family Communication: None at bedside.  Disposition Plan: Admitted  Time Spent in minutes   25 minutes  Procedures  None  Consults   Nephrology PCCM, admitting  DVT Prophylaxis  Heparin     Lab Results  Component Value Date   PLT 188 09/19/2013    Medications  Scheduled Meds: . allopurinol  100 mg Oral Daily  . amLODipine  10 mg Oral Daily  . cinacalcet  30 mg Oral Q breakfast  . furosemide  80 mg Oral Daily  . heparin  5,000 Units Subcutaneous 3 times per day  . labetalol  300 mg Oral BID  . lisinopril  40 mg Oral BID  . minoxidil  2.5 mg Oral Daily  . mycophenolate  250 mg Oral BID  . pantoprazole (PROTONIX) IV  40 mg Intravenous QHS  . predniSONE  15 mg Oral Q breakfast  . sevelamer carbonate  800 mg Oral TID WC & HS  . sulfamethoxazole-trimethoprim  1 tablet Oral Q M,W,F  . tacrolimus  10 mg Oral BID  . valGANciclovir  450 mg Oral Q M,W,F   Continuous Infusions: . sodium chloride Stopped (09/16/13 2210)   PRN Meds:.sodium chloride, sodium chloride, acetaminophen, feeding supplement (NEPRO CARB STEADY), heparin, hydrALAZINE, lidocaine (PF), lidocaine-prilocaine, LORazepam, pentafluoroprop-tetrafluoroeth  Antibiotics    Anti-infectives   Start     Dose/Rate Route Frequency Ordered Stop   09/17/13 1000  sulfamethoxazole-trimethoprim (BACTRIM,SEPTRA) 400-80 MG per tablet 1 tablet     1 tablet Oral Every M-W-F 09/16/13 2055     09/17/13 1000  valGANciclovir (VALCYTE) 450 MG tablet TABS 450 mg     450  mg Oral Every M-W-F 09/16/13 2055          Subjective:   Frank [name] seen and examined today.  Patient would like to be discharged today.  He states he cannot stay in the hospital any longer.  He denies chest pain, dizziness, headache, abdominal pain.  Objective:   Filed Vitals:   09/19/13 1722 09/19/13 1730 09/19/13 2100 09/20/13 0500  BP: 156/107 154/112 161/108 160/114  Pulse: 75  79 77  Temp: 98.1 F (36.7 C)  97.5 F (36.4 C) 97.4 F (36.3 C)  TempSrc: Oral  Oral Oral  Resp: _0 Height:      Weight:   76.2 kg (167 lb 15.9 oz)   SpO2: 100%  95% 97%    Wt Readings from Last 3 Encounters:  09/19/13 76.2 kg (167 lb 15.9 oz)  09/12/13  77.429 kg (170 lb 11.2 oz)  06/24/13 81.8 kg (180 lb 5.4 oz)     Intake/Output Summary (Last 24 hours) at 09/20/13 0827 Last data filed at 09/19/13 1800  Gross per 24 hour  Intake    840 ml  Output      0 ml  Net    840 ml    Exam  General: Well developed, well nourished, NAD, appears stated age  HEENT: NCAT, mucous membranes moist.   Neck: Supple, no JVD, no masses  Cardiovascular: S1 S2 auscultated, No murmurs, Regular rate and rhythm.  Respiratory: Clear to auscultation bilaterally with equal chest rise  Abdomen: Soft, nontender, nondistended, + bowel sounds  Extremities: warm dry without cyanosis clubbing, trace LE edema  Neuro: AAOx3, no focal deficits  Skin: Without rashes exudates or nodules  Psych: Normal affect and demeanor with intact judgement and insight   Data Review   Micro Results Recent Results (from the past 240 hour(s))  MRSA PCR SCREENING     Status: None   Collection Time    09/16/13  8:35 PM      Result Value Ref Range Status   MRSA by PCR NEGATIVE  NEGATIVE Final   Comment:            The GeneXpert MRSA Assay (FDA     approved for NASAL specimens     only), is one component of a     comprehensive MRSA colonization     surveillance program. It is not     intended to diagnose MRSA     infection nor to guide or     monitor treatment for     MRSA infections.    Radiology Reports Dg Chest 2 View  09/11/2013   CLINICAL DATA:  Left-sided chest pain, shortness of breath and mild nausea. History of smoking.  EXAM: CHEST  2 VIEW  COMPARISON:  Chest radiograph performed 09/07/2013  FINDINGS: The lungs are well-aerated. A small left pleural effusion is again seen. Mild vascular congestion is noted, without significant pulmonary edema. There is no evidence of pneumothorax.  The heart is mildly enlarged. A right-sided dual-lumen catheter is seen ending within the right atrium. No acute osseous abnormalities are seen.  IMPRESSION: Small left pleural  effusion again seen. Vascular congestion, without significant pulmonary edema. Mild cardiomegaly noted.   Electronically Signed   By: Garald Balding M.D.   On: 09/11/2013 22:27   Dg Chest 2 View  09/07/2013   CLINICAL DATA:  Cold symptoms.  EXAM: CHEST  2 VIEW  COMPARISON:  Chest x-ray 07/16/2013.  FINDINGS: Lung volumes are normal. No  consolidative airspace disease. Trace bilateral pleural effusions. No evidence of pulmonary edema. Heart size is mildly enlarged. Upper mediastinal contours are within normal limits. Right internal jugular PermCath with tip terminating in the superior aspect of the right atrium.  IMPRESSION: 1. Trace bilateral pleural effusions. 2. Mild cardiomegaly.   Electronically Signed   By: Vinnie Langton M.D.   On: 09/07/2013 19:26   Dg Chest Port 1 View  09/16/2013   CLINICAL DATA:  Chest pain and shortness of breath.  Weakness.  EXAM: PORTABLE CHEST - 1 VIEW  COMPARISON:  PA and lateral chest 09/11/2013.  FINDINGS: There is cardiomegaly and interstitial pulmonary edema. There also appears to be a small left pleural effusion. No pneumothorax.  IMPRESSION: Cardiomegaly and pulmonary edema. Likely small effusion on the left noted.   Electronically Signed   By: Inge Rise M.D.   On: 09/16/2013 16:06    CBC  Recent Labs Lab 09/16/13 1530 09/17/13 0245 09/18/13 0442 09/19/13 0650  WBC 5.7 6.8 12.0* 7.9  HGB 10.0* 9.2* 10.1* 10.0*  HCT 33.2* 29.1* 31.3* 30.9*  PLT 253 235 211 188  MCV 81.8 80.4 80.7 80.5  MCH 24.6* 25.4* 26.0 26.0  MCHC 30.1 31.6 32.3 32.4  RDW 18.0* 17.8* 18.2* 18.0*    Chemistries   Recent Labs Lab 09/16/13 1530 09/17/13 0245 09/18/13 0442 09/19/13 0650 09/20/13 0400  NA 139 139 135* 139 133*  K 4.9 4.7 4.4 3.9 4.3  CL 100 102 97 100 95*  CO2 18* _0 GLUCOSE 129* 100* 136* 123* 112*  BUN 63* 69* 35* 24* 38*  CREATININE 11.86* 12.67* 8.13* 6.11* 7.92*  CALCIUM 9.4 8.9 8.4 8.5 8.2*    ------------------------------------------------------------------------------------------------------------------ estimated creatinine clearance is 12.2 ml/min (by C-G formula based on Cr of 7.92). ------------------------------------------------------------------------------------------------------------------ No results found for this basename: HGBA1C,  in the last 72 hours ------------------------------------------------------------------------------------------------------------------ No results found for this basename: CHOL, HDL, LDLCALC, TRIG, CHOLHDL, LDLDIRECT,  in the last 72 hours ------------------------------------------------------------------------------------------------------------------ No results found for this basename: TSH, T4TOTAL, FREET3, T3FREE, THYROIDAB,  in the last 72 hours ------------------------------------------------------------------------------------------------------------------ No results found for this basename: VITAMINB12, FOLATE, FERRITIN, TIBC, IRON, RETICCTPCT,  in the last 72 hours  Coagulation profile No results found for this basename: INR, PROTIME,  in the last 168 hours  No results found for this basename: DDIMER,  in the last 72 hours  Cardiac Enzymes  Recent Labs Lab 09/16/13 2140 09/17/13 0245 09/17/13 0832  TROPONINI <0.30 <0.30 <0.30   ------------------------------------------------------------------------------------------------------------------ No components found with this basename: POCBNP,     Jerrica Thorman D.O. on 09/20/2013 at 8:27 AM  Between 7am to 7pm - Pager - [phone]  After 7pm go to [email or web address] - password [email or web address]  And look for the night coverage person covering for me after hours  [facility] Office  [phone]

## 2013-09-20 NOTE — Discharge Summary (Signed)
  Physician Discharge Summary  Frank [name] EEF:[id number] DOB: 17-Feb-1966 DOA: 09/16/2013  PCP: Frank [name]  Admit date: 09/16/2013 Discharge date: 09/20/2013 AGAINST MEDICAL ADVICE  Time spent: 15 minutes  Recommendations for Outpatient Follow-up:  None Patient left AMA  Discharge Diagnoses:  Active Problems:   Hypertensive emergency   Acute respiratory failure Dyspnea secondary to pulmonary edema Chest pain Elevated BNP ESRD requiring dialysis Nausea Anemia of chronic disease  Status post renal transplant Anxiety Headache  Discharge Condition: None  Diet recommendation: None  Filed Weights   09/18/13 1819 09/18/13 2135 09/19/13 2100  Weight: 76.6 kg (168 lb 14 oz) 71.1 kg (156 lb 12 oz) 76.2 kg (167 lb 15.9 oz)    History of present illness:  This is a 48 year old male w/ ESRD. Just discharged 2/22 from [facility] for evaluation of CP. Cardiac enzymes were nml, EKG unremarkable. Sent home. Had HD last on 2/24. On 2/25 began to have some increase in shortness of breath and CP. HD was cancelled on 2/26 due to snow. CP and dyspnea worse so presented to the ER. On presentation SBP 205, CXR w/ edema. Wt 79.9 kg (up from was and BNP >70K. Was transferred to [facility] in anticipation of need for urgent HD.  >denied drug use  >denied missing scheduled meds  >denies missing HD w/ exception of today   Hospital Course:  Patient was admitted with hypertensive emergency.  He decided to leave [place].  Last documented BP 144/110. Therefore he was not discharged with any medications or instructions.    Dyspnea secondary to pulmonary edema  -Patient hemodialysis session due to the weather  -Was initially admitted to ICU  -Currently improving with dialysis, continue supplemental oxygen  -Patient may also need outpatient followup with sleep apnea workup  Hypertensive emergency  -Blood pressure is under better control.  -Initially placed on nitroglycerin  -Continue  amlodipine, lisinopril, labetalol  -Will restart clonidine, nephrology discontinuing minoxidil  -Hydralazine when necessary  Chest pain  -Improving, Likely secondary to pulmonary edema  -Patient had recent hospitalization with acute coronary syndrome ruled out on 08/12/2013  -Will continue to monitor.  Elevated BNP  -Likely secondary to renal failure with hypervolemia  -Continue dialysis  End-stage renal disease requiring dialysis  -complicated by Metabolic acidosis (mild)  -Nephrology consulted  -Continue cinacalcet and renvela  Nausea  -Resolved  -Continue antiemetic  Anemia of chronic disease  -Patient hemodynamically stable, no evidence of bleed  -Continue to monitor CBC  Status post renal transplant  -Continue CellCept, prednisone, and Prograf  -Nephrology will decrease prograf  Anxiety  -Continue Ativan 0.26m BID PRN PO  Headache  -continue tylenol as needed   Procedures: None  Consultations:  None  Discharge Exam: Filed Vitals:   09/20/13 1603  BP: 144/110  Pulse:   Temp:   Resp:     Signed:  [name]  [facility] 09/20/2013, 5:00 PM

## 2013-09-20 NOTE — Progress Notes (Signed)
ESRD patient with failed renal transplant admitted with shortness of breath at rest after missing dilaysis TTS Undergoing dialysis at present. Uncontrolled HTN will aim for fluid removal, restart clonidine Access=dialysis catheter  Immunosuppression quite high doses still despite having lost allograft  Will reduce prograf which may be driving BP elevation.  Also reduce prednisone   Subjective: Interval History: BP still high.  Objective: Vital signs in last 24 hours: Temp:  [97.4 F (36.3 C)-98.3 F (36.8 C)] 97.7 F (36.5 C) (03/02 0919) Pulse Rate:  [75-82] 77 (03/02 0919) Resp:  [18-19] 18 (03/02 0919) BP: (150-180)/(100-120) 150/100 mmHg (03/02 1144) SpO2:  [94 %-100 %] 94 % (03/02 0919) Weight:  [76.2 kg (167 lb 15.9 oz)] 76.2 kg (167 lb 15.9 oz) (03/01 2100) Weight change: -0.4 kg (-14.1 oz)  Intake/Output from previous day: 03/01 0701 - 03/02 0700 In: 1200 [P.O.:1200] Out: 0  Intake/Output this shift: Total I/O In: 120 [P.O.:120] Out: -   General appearance: alert and cooperative Back: negative, symmetric, no curvature. ROM normal. No CVA tenderness. Resp: clear to auscultation bilaterally Chest wall: no tenderness Cardio: regular rate and rhythm, S1, S2 normal, no murmur, click, rub or gallop Extremities: extremities normal, atraumatic, no cyanosis or edema  Lab Results:  Recent Labs  09/18/13 0442 09/19/13 0650  WBC 12.0* 7.9  HGB 10.1* 10.0*  HCT 31.3* 30.9*  PLT 211 188   BMET:  Recent Labs  09/19/13 0650 09/20/13 0400  NA 139 133*  K 3.9 4.3  CL 100 95*  CO2 26 24  GLUCOSE 123* 112*  BUN 24* 38*  CREATININE 6.11* 7.92*  CALCIUM 8.5 8.2*   No results found for this basename: PTH,  in the last 72 hours Iron Studies: No results found for this basename: IRON, TIBC, TRANSFERRIN, FERRITIN,  in the last 72 hours Studies/Results: No results found.  Scheduled: . allopurinol  100 mg Oral Daily  . amLODipine  10 mg Oral Daily  . cinacalcet  30 mg  Oral Q breakfast  . cloNIDine  0.2 mg Oral BID  . furosemide  80 mg Oral Daily  . heparin  5,000 Units Subcutaneous 3 times per day  . labetalol  300 mg Oral BID  . lisinopril  40 mg Oral BID  . mycophenolate  250 mg Oral BID  . pantoprazole (PROTONIX) IV  40 mg Intravenous QHS  . [START ON 09/21/2013] predniSONE  10 mg Oral Q breakfast  . sevelamer carbonate  800 mg Oral TID WC & HS  . sulfamethoxazole-trimethoprim  1 tablet Oral Q M,W,F  . [START ON 09/21/2013] tacrolimus  2 mg Oral BID  . valGANciclovir  450 mg Oral Q M,W,F     LOS: 4 days   Daliah Chaudoin C 09/20/2013,12:12 PM

## 2013-09-20 NOTE — Progress Notes (Signed)
09/20/2013 PATIENT LEFT AMA, MD IS AWARE, ALSO MENTION PATIENT HEP LOCK WAS NOT TAKEN OUT. RN SEARCH FOR PATIENT BUT COULD NOT FINE HIM. Yanessa Hocevar RN.

## 2013-10-17 ENCOUNTER — Emergency Department (HOSPITAL_COMMUNITY): Payer: Medicare Other

## 2013-10-17 ENCOUNTER — Inpatient Hospital Stay (HOSPITAL_COMMUNITY)
Admission: EM | Admit: 2013-10-17 | Discharge: 2013-10-18 | DRG: 291 | Disposition: A | Discharge status: Home / Self Care | Payer: Medicare Other | Attending: Internal Medicine | Admitting: Internal Medicine

## 2013-10-17 ENCOUNTER — Encounter (HOSPITAL_COMMUNITY): Payer: Self-pay | Admitting: Emergency Medicine

## 2013-10-17 DIAGNOSIS — J209 Acute bronchitis, unspecified: Secondary | ICD-10-CM | POA: Diagnosis present

## 2013-10-17 DIAGNOSIS — I161 Hypertensive emergency: Secondary | ICD-10-CM

## 2013-10-17 DIAGNOSIS — E119 Type 2 diabetes mellitus without complications: Secondary | ICD-10-CM | POA: Diagnosis present

## 2013-10-17 DIAGNOSIS — F3289 Other specified depressive episodes: Secondary | ICD-10-CM | POA: Diagnosis present

## 2013-10-17 DIAGNOSIS — I160 Hypertensive urgency: Secondary | ICD-10-CM

## 2013-10-17 DIAGNOSIS — Z992 Dependence on renal dialysis: Secondary | ICD-10-CM

## 2013-10-17 DIAGNOSIS — IMO0002 Reserved for concepts with insufficient information to code with codable children: Secondary | ICD-10-CM

## 2013-10-17 DIAGNOSIS — G8929 Other chronic pain: Secondary | ICD-10-CM

## 2013-10-17 DIAGNOSIS — N186 End stage renal disease: Secondary | ICD-10-CM

## 2013-10-17 DIAGNOSIS — D631 Anemia in chronic kidney disease: Secondary | ICD-10-CM

## 2013-10-17 DIAGNOSIS — F32A Depression, unspecified: Secondary | ICD-10-CM

## 2013-10-17 DIAGNOSIS — Y830 Surgical operation with transplant of whole organ as the cause of abnormal reaction of the patient, or of later complication, without mention of misadventure at the time of the procedure: Secondary | ICD-10-CM | POA: Diagnosis present

## 2013-10-17 DIAGNOSIS — T8610 Unspecified complication of kidney transplant: Secondary | ICD-10-CM | POA: Diagnosis present

## 2013-10-17 DIAGNOSIS — M109 Gout, unspecified: Secondary | ICD-10-CM

## 2013-10-17 DIAGNOSIS — E872 Acidosis, unspecified: Secondary | ICD-10-CM

## 2013-10-17 DIAGNOSIS — F329 Major depressive disorder, single episode, unspecified: Secondary | ICD-10-CM | POA: Diagnosis present

## 2013-10-17 DIAGNOSIS — I10 Essential (primary) hypertension: Secondary | ICD-10-CM

## 2013-10-17 DIAGNOSIS — R112 Nausea with vomiting, unspecified: Secondary | ICD-10-CM

## 2013-10-17 DIAGNOSIS — Z87891 Personal history of nicotine dependence: Secondary | ICD-10-CM

## 2013-10-17 DIAGNOSIS — N179 Acute kidney failure, unspecified: Secondary | ICD-10-CM

## 2013-10-17 DIAGNOSIS — Z841 Family history of disorders of kidney and ureter: Secondary | ICD-10-CM

## 2013-10-17 DIAGNOSIS — R079 Chest pain, unspecified: Secondary | ICD-10-CM

## 2013-10-17 DIAGNOSIS — I5033 Acute on chronic diastolic (congestive) heart failure: Principal | ICD-10-CM | POA: Diagnosis present

## 2013-10-17 DIAGNOSIS — N2581 Secondary hyperparathyroidism of renal origin: Secondary | ICD-10-CM

## 2013-10-17 DIAGNOSIS — E875 Hyperkalemia: Secondary | ICD-10-CM

## 2013-10-17 DIAGNOSIS — Z79899 Other long term (current) drug therapy: Secondary | ICD-10-CM

## 2013-10-17 DIAGNOSIS — I509 Heart failure, unspecified: Secondary | ICD-10-CM

## 2013-10-17 DIAGNOSIS — J9600 Acute respiratory failure, unspecified whether with hypoxia or hypercapnia: Secondary | ICD-10-CM

## 2013-10-17 DIAGNOSIS — I120 Hypertensive chronic kidney disease with stage 5 chronic kidney disease or end stage renal disease: Secondary | ICD-10-CM | POA: Diagnosis present

## 2013-10-17 DIAGNOSIS — N189 Chronic kidney disease, unspecified: Secondary | ICD-10-CM

## 2013-10-17 DIAGNOSIS — J9620 Acute and chronic respiratory failure, unspecified whether with hypoxia or hypercapnia: Secondary | ICD-10-CM | POA: Diagnosis present

## 2013-10-17 DIAGNOSIS — R55 Syncope and collapse: Secondary | ICD-10-CM

## 2013-10-17 LAB — CBC
HCT: 31.1 % — ABNORMAL LOW (ref 39.0–52.0)
Hemoglobin: 10.2 g/dL — ABNORMAL LOW (ref 13.0–17.0)
MCH: 25.5 pg — ABNORMAL LOW (ref 26.0–34.0)
MCHC: 32.8 g/dL (ref 30.0–36.0)
MCV: 77.8 fL — ABNORMAL LOW (ref 78.0–100.0)
PLATELETS: 180 10*3/uL (ref 150–400)
RBC: 4 MIL/uL — ABNORMAL LOW (ref 4.22–5.81)
RDW: 18.2 % — AB (ref 11.5–15.5)
WBC: 3.7 10*3/uL — ABNORMAL LOW (ref 4.0–10.5)

## 2013-10-17 LAB — CBC WITH DIFFERENTIAL/PLATELET
Basophils Absolute: 0 10*3/uL (ref 0.0–0.1)
Basophils Relative: 0 % (ref 0–1)
Eosinophils Absolute: 0.2 10*3/uL (ref 0.0–0.7)
Eosinophils Relative: 3 % (ref 0–5)
HEMATOCRIT: 36.7 % — AB (ref 39.0–52.0)
HEMOGLOBIN: 11.9 g/dL — AB (ref 13.0–17.0)
LYMPHS ABS: 2 10*3/uL (ref 0.7–4.0)
Lymphocytes Relative: 37 % (ref 12–46)
MCH: 25.4 pg — ABNORMAL LOW (ref 26.0–34.0)
MCHC: 32.4 g/dL (ref 30.0–36.0)
MCV: 78.4 fL (ref 78.0–100.0)
MONO ABS: 0.4 10*3/uL (ref 0.1–1.0)
Monocytes Relative: 8 % (ref 3–12)
Neutro Abs: 2.9 10*3/uL (ref 1.7–7.7)
Neutrophils Relative %: 52 % (ref 43–77)
Platelets: 193 10*3/uL (ref 150–400)
RBC: 4.68 MIL/uL (ref 4.22–5.81)
RDW: 18.3 % — ABNORMAL HIGH (ref 11.5–15.5)
WBC: 5.5 10*3/uL (ref 4.0–10.5)

## 2013-10-17 LAB — COMPREHENSIVE METABOLIC PANEL
ALT: 10 U/L (ref 0–53)
AST: 15 U/L (ref 0–37)
Albumin: 3.8 g/dL (ref 3.5–5.2)
Alkaline Phosphatase: 98 U/L (ref 39–117)
BILIRUBIN TOTAL: 0.7 mg/dL (ref 0.3–1.2)
BUN: 69 mg/dL — ABNORMAL HIGH (ref 6–23)
CO2: 16 meq/L — AB (ref 19–32)
CREATININE: 15.95 mg/dL — AB (ref 0.50–1.35)
Calcium: 9.3 mg/dL (ref 8.4–10.5)
Chloride: 101 mEq/L (ref 96–112)
GFR calc Af Amer: 4 mL/min — ABNORMAL LOW (ref >90)
GFR, EST NON AFRICAN AMERICAN: 3 mL/min — AB (ref >90)
Glucose, Bld: 83 mg/dL (ref 70–99)
Potassium: 5.6 mEq/L — ABNORMAL HIGH (ref 3.7–5.3)
Sodium: 135 mEq/L — ABNORMAL LOW (ref 137–147)
Total Protein: 8.8 g/dL — ABNORMAL HIGH (ref 6.0–8.3)

## 2013-10-17 LAB — HEMOGLOBIN A1C
Hgb A1c MFr Bld: 5.9 % — ABNORMAL HIGH (ref <5.7)
Mean Plasma Glucose: 123 mg/dL — ABNORMAL HIGH (ref <117)

## 2013-10-17 LAB — CREATININE, SERUM
Creatinine, Ser: 15.5 mg/dL — ABNORMAL HIGH (ref 0.50–1.35)
GFR calc Af Amer: 4 mL/min — ABNORMAL LOW (ref >90)
GFR calc non Af Amer: 3 mL/min — ABNORMAL LOW (ref >90)

## 2013-10-17 LAB — EXPECTORATED SPUTUM ASSESSMENT W GRAM STAIN, RFLX TO RESP C

## 2013-10-17 LAB — CBG MONITORING, ED: GLUCOSE-CAPILLARY: 75 mg/dL (ref 70–99)

## 2013-10-17 LAB — GLUCOSE, CAPILLARY: Glucose-Capillary: 111 mg/dL — ABNORMAL HIGH (ref 70–99)

## 2013-10-17 LAB — I-STAT TROPONIN, ED: TROPONIN I, POC: 0.03 ng/mL (ref 0.00–0.08)

## 2013-10-17 LAB — PRO B NATRIURETIC PEPTIDE: Pro B Natriuretic peptide (BNP): >70000 pg/mL — ABNORMAL HIGH (ref 0–125)

## 2013-10-17 MED ORDER — BENZONATATE 100 MG PO CAPS
100.0000 mg | ORAL_CAPSULE | Freq: Once | ORAL | Status: AC
  Administered 2013-10-17: 100 mg via ORAL
  Filled 2013-10-17: qty 1

## 2013-10-17 MED ORDER — BENZONATATE 100 MG PO CAPS: 200.0000 mg | ORAL_CAPSULE | Freq: Two times a day (BID) | ORAL | Status: DC | PRN

## 2013-10-17 MED ORDER — PREDNISONE 5 MG PO TABS
15.0000 mg | ORAL_TABLET | Freq: Every day | ORAL | Status: DC
  Administered 2013-10-17 – 2013-10-18 (×2): 15 mg via ORAL
  Filled 2013-10-17 (×3): qty 1

## 2013-10-17 MED ORDER — DOXYCYCLINE HYCLATE 100 MG IV SOLR
100.0000 mg | Freq: Two times a day (BID) | INTRAVENOUS | Status: DC
  Administered 2013-10-17 – 2013-10-18 (×2): 100 mg via INTRAVENOUS
  Filled 2013-10-17 (×3): qty 100

## 2013-10-17 MED ORDER — LABETALOL HCL 200 MG PO TABS
200.0000 mg | ORAL_TABLET | Freq: Three times a day (TID) | ORAL | Status: DC
  Administered 2013-10-17 – 2013-10-18 (×3): 200 mg via ORAL
  Filled 2013-10-17 (×5): qty 1

## 2013-10-17 MED ORDER — CLONIDINE HCL 0.2 MG PO TABS
0.2000 mg | ORAL_TABLET | Freq: Two times a day (BID) | ORAL | Status: DC
  Administered 2013-10-17: 0.2 mg via ORAL
  Filled 2013-10-17 (×3): qty 1

## 2013-10-17 MED ORDER — ALLOPURINOL 100 MG PO TABS
100.0000 mg | ORAL_TABLET | Freq: Every day | ORAL | Status: DC
  Administered 2013-10-18: 100 mg via ORAL
  Filled 2013-10-17: qty 1

## 2013-10-17 MED ORDER — ONDANSETRON HCL 4 MG/2ML IJ SOLN: 4.0000 mg | Freq: Four times a day (QID) | INTRAMUSCULAR | Status: DC | PRN

## 2013-10-17 MED ORDER — LIDOCAINE HCL (PF) 1 % IJ SOLN: 5.0000 mL | INTRAMUSCULAR | Status: DC | PRN

## 2013-10-17 MED ORDER — COLCHICINE 0.6 MG PO TABS: 0.6000 mg | ORAL_TABLET | Freq: Every day | ORAL | Status: DC | PRN

## 2013-10-17 MED ORDER — VALGANCICLOVIR HCL 450 MG PO TABS
450.0000 mg | ORAL_TABLET | ORAL | Status: DC
  Administered 2013-10-18: 450 mg via ORAL
  Filled 2013-10-17: qty 1

## 2013-10-17 MED ORDER — IPRATROPIUM-ALBUTEROL 0.5-2.5 (3) MG/3ML IN SOLN
3.0000 mL | Freq: Once | RESPIRATORY_TRACT | Status: AC
  Administered 2013-10-17: 3 mL via RESPIRATORY_TRACT
  Filled 2013-10-17: qty 3

## 2013-10-17 MED ORDER — HEPARIN SODIUM (PORCINE) 5000 UNIT/ML IJ SOLN
5000.0000 [IU] | Freq: Three times a day (TID) | INTRAMUSCULAR | Status: DC
  Filled 2013-10-17 (×5): qty 1

## 2013-10-17 MED ORDER — NEPRO/CARBSTEADY PO LIQD
237.0000 mL | ORAL | Status: DC | PRN
  Filled 2013-10-17: qty 237

## 2013-10-17 MED ORDER — FUROSEMIDE 10 MG/ML IJ SOLN
40.0000 mg | Freq: Once | INTRAMUSCULAR | Status: AC
  Administered 2013-10-17: 40 mg via INTRAVENOUS
  Filled 2013-10-17: qty 4

## 2013-10-17 MED ORDER — MINOXIDIL 2.5 MG PO TABS
2.5000 mg | ORAL_TABLET | Freq: Every day | ORAL | Status: DC
  Administered 2013-10-17 – 2013-10-18 (×2): 2.5 mg via ORAL
  Filled 2013-10-17 (×2): qty 1

## 2013-10-17 MED ORDER — POLYETHYLENE GLYCOL 3350 17 G PO PACK: 17.0000 g | PACK | Freq: Every day | ORAL | Status: DC | PRN

## 2013-10-17 MED ORDER — GUAIFENESIN-DM 100-10 MG/5ML PO SYRP
5.0000 mL | ORAL_SOLUTION | ORAL | Status: DC | PRN
  Filled 2013-10-17: qty 5

## 2013-10-17 MED ORDER — SEVELAMER CARBONATE 800 MG PO TABS
800.0000 mg | ORAL_TABLET | Freq: Three times a day (TID) | ORAL | Status: DC
  Administered 2013-10-17 – 2013-10-18 (×3): 800 mg via ORAL
  Filled 2013-10-17 (×7): qty 1

## 2013-10-17 MED ORDER — ONDANSETRON 8 MG PO TBDP
8.0000 mg | ORAL_TABLET | Freq: Once | ORAL | Status: AC
  Administered 2013-10-17: 8 mg via ORAL
  Filled 2013-10-17: qty 1

## 2013-10-17 MED ORDER — HYDRALAZINE HCL 20 MG/ML IJ SOLN
10.0000 mg | Freq: Four times a day (QID) | INTRAMUSCULAR | Status: DC | PRN
  Filled 2013-10-17: qty 0.5

## 2013-10-17 MED ORDER — CLONIDINE HCL 0.1 MG PO TABS
0.2000 mg | ORAL_TABLET | Freq: Once | ORAL | Status: AC
  Administered 2013-10-17: 0.2 mg via ORAL
  Filled 2013-10-17: qty 2

## 2013-10-17 MED ORDER — SODIUM CHLORIDE 0.9 % IV SOLN
100.0000 mL | INTRAVENOUS | Status: DC | PRN
Start: 2013-10-17 — End: 2013-10-18

## 2013-10-17 MED ORDER — PENTAFLUOROPROP-TETRAFLUOROETH EX AERO: 1.0000 "application " | INHALATION_SPRAY | CUTANEOUS | Status: DC | PRN

## 2013-10-17 MED ORDER — FUROSEMIDE 80 MG PO TABS
80.0000 mg | ORAL_TABLET | Freq: Every day | ORAL | Status: DC
Start: 2013-10-17 — End: 2013-10-18
  Administered 2013-10-17 – 2013-10-18 (×2): 80 mg via ORAL
  Filled 2013-10-17 (×2): qty 1

## 2013-10-17 MED ORDER — SULFAMETHOXAZOLE-TRIMETHOPRIM 400-80 MG PO TABS
1.0000 | ORAL_TABLET | ORAL | Status: DC
  Administered 2013-10-18: 1 via ORAL
  Filled 2013-10-17: qty 1

## 2013-10-17 MED ORDER — MYCOPHENOLATE MOFETIL 250 MG PO CAPS
250.0000 mg | ORAL_CAPSULE | Freq: Two times a day (BID) | ORAL | Status: DC
  Filled 2013-10-17 (×3): qty 1

## 2013-10-17 MED ORDER — NITROGLYCERIN IN D5W 200-5 MCG/ML-% IV SOLN: 10.0000 ug/min | INTRAVENOUS | Status: DC

## 2013-10-17 MED ORDER — ONDANSETRON HCL 4 MG PO TABS: 4.0000 mg | ORAL_TABLET | Freq: Four times a day (QID) | ORAL | Status: DC | PRN

## 2013-10-17 MED ORDER — ALTEPLASE 2 MG IJ SOLR: 2.0000 mg | Freq: Once | INTRAMUSCULAR | Status: AC | PRN

## 2013-10-17 MED ORDER — HEPARIN SODIUM (PORCINE) 1000 UNIT/ML DIALYSIS: 1000.0000 [IU] | INTRAMUSCULAR | Status: DC | PRN

## 2013-10-17 MED ORDER — AMLODIPINE BESYLATE 10 MG PO TABS
10.0000 mg | ORAL_TABLET | Freq: Every day | ORAL | Status: DC
  Administered 2013-10-17 – 2013-10-18 (×2): 10 mg via ORAL
  Filled 2013-10-17 (×2): qty 1

## 2013-10-17 MED ORDER — CINACALCET HCL 30 MG PO TABS
30.0000 mg | ORAL_TABLET | Freq: Every day | ORAL | Status: DC
  Administered 2013-10-17 – 2013-10-18 (×2): 30 mg via ORAL
  Filled 2013-10-17 (×3): qty 1

## 2013-10-17 MED ORDER — TACROLIMUS 1 MG PO CAPS
10.0000 mg | ORAL_CAPSULE | Freq: Two times a day (BID) | ORAL | Status: DC
  Administered 2013-10-17: 5 mg via ORAL
  Administered 2013-10-18: 10 mg via ORAL
  Filled 2013-10-17 (×3): qty 10

## 2013-10-17 MED ORDER — SODIUM CHLORIDE 0.9 % IV SOLN: 100.0000 mL | INTRAVENOUS | Status: DC | PRN

## 2013-10-17 MED ORDER — PANTOPRAZOLE SODIUM 40 MG PO TBEC
40.0000 mg | DELAYED_RELEASE_TABLET | Freq: Every day | ORAL | Status: DC
  Administered 2013-10-17 – 2013-10-18 (×2): 40 mg via ORAL
  Filled 2013-10-17 (×2): qty 1

## 2013-10-17 MED ORDER — HYDROCODONE-ACETAMINOPHEN 5-325 MG PO TABS
1.0000 | ORAL_TABLET | ORAL | Status: DC | PRN
  Filled 2013-10-17: qty 2

## 2013-10-17 MED ORDER — LIDOCAINE-PRILOCAINE 2.5-2.5 % EX CREA: 1.0000 "application " | TOPICAL_CREAM | CUTANEOUS | Status: DC | PRN

## 2013-10-17 MED ORDER — HEPARIN SODIUM (PORCINE) 1000 UNIT/ML DIALYSIS: 2000.0000 [IU] | INTRAMUSCULAR | Status: DC | PRN

## 2013-10-17 MED ORDER — CLONIDINE HCL 0.1 MG PO TABS
0.1000 mg | ORAL_TABLET | ORAL | Status: DC | PRN
  Filled 2013-10-17: qty 1

## 2013-10-17 MED ORDER — NITROGLYCERIN 2 % TD OINT
0.5000 [in_us] | TOPICAL_OINTMENT | Freq: Once | TRANSDERMAL | Status: AC
  Administered 2013-10-17: 0.5 [in_us] via TOPICAL
  Filled 2013-10-17: qty 30

## 2013-10-17 MED ORDER — SODIUM CHLORIDE 0.9 % IJ SOLN
3.0000 mL | Freq: Two times a day (BID) | INTRAMUSCULAR | Status: DC
  Administered 2013-10-17: 3 mL via INTRAVENOUS

## 2013-10-17 MED ORDER — LISINOPRIL 10 MG PO TABS
10.0000 mg | ORAL_TABLET | Freq: Every day | ORAL | Status: DC
  Administered 2013-10-18: 10 mg via ORAL
  Filled 2013-10-17: qty 1

## 2013-10-17 NOTE — ED Notes (Addendum)
Pt from home c/o shortness of breath,nausea,weakness and fatigue starting yesterday. Cough present with clear production. Denies pain. Pt is hypertensive  Hx of same in which he tales multiple BP meds for and did not take this am. Pt also has Hx of Anemia.

## 2013-10-17 NOTE — H&P (Signed)
Patient Demographics  Frank [name], is a 48 y.o. male  MRN: [id number]   DOB - 30-Jun-1966  Admit Date - 10/17/2013  Outpatient Primary MD for the patient is Adin Hector   With History of -  Past Medical History  Diagnosis Date  . Hypertension   . ESRD (end stage renal disease)     due to HTN per patient, followed at [facility], s/p failed kidney transplant  . Depression   . Complication of anesthesia     itching, sore throat  . Diabetes mellitus without complication     No history per patient, but remains under history as A1c would not be accurate given on dialysis      Past Surgical History  Procedure Laterality Date  . Kidney receipient  2006    failed and started HD in March 2014  . Capd insertion    . Capd removal      in for   Chief Complaint  Patient presents with  . Shortness of Breath     HPI  [name]  is a 48 y.o. male, history of renal transplant which failed now status ESRD and was started on dialysis Tuesday Thursday Saturday in 2014, left IJ catheter for dialysis, hypertension in poor control, depression, questionable history of diabetes mellitus on diet control, who missed his dialysis yesterday due to a productive cough which has had for 2-3 days, no fever chills, no chest pain or palpitations, no abdominal pain diarrhea, no focal weakness.   Came to the ER for shortness of breath, missed dialysis, productive cough, in the ER workup consistent with acute bronchitis, mild fluid overload due to missed her dialysis, case was discussed with nephrology who requested hospitalist admission with transfer to [facility] for dialysis.    Review of Systems    In addition to the HPI above,  No Fever-chills, No Headache, No changes with Vision or hearing, No problems swallowing food or  Liquids, No Chest pain, positive productive cough and shortness of breath,  No Abdominal pain, No Nausea or Vommitting, Bowel movements are regular, No Blood in stool or Urine, No dysuria, No new skin rashes or bruises, No new joints pains-aches,  No new weakness, tingling, numbness in any extremity, No recent weight gain or loss, No polyuria, polydypsia or polyphagia, No significant Mental Stressors.  A full 10 point Review of Systems was done, except as stated above, all other Review of Systems were negative.   Social History History  Substance Use Topics  . Smoking status: Former Smoker -- 1.00 packs/day for 1 years    Types: Cigarettes  . Smokeless tobacco: Never Used     Comment: quit Jan 2014  . Alcohol Use: No      Family History Brother on dialysis has ESRD  Prior to Admission medications   Medication Sig Start Date End Date Taking? Authorizing Provider  allopurinol (ZYLOPRIM) 100 MG tablet Take 1 tablet (100 mg total)  by mouth daily. 04/21/13  Yes [name], MD  amLODipine (NORVASC) 5 MG tablet Take 10 mg by mouth daily.   Yes Historical Provider, MD  benzonatate (TESSALON) 100 MG capsule Take 2 capsules (200 mg total) by mouth 2 (two) times daily as needed for cough. 09/07/13  Yes Lauren Burnetta Sabin, PA-C  cinacalcet (SENSIPAR) 30 MG tablet Take 30 mg by mouth daily.   Yes Historical Provider, MD  cloNIDine (CATAPRES) 0.2 MG tablet Take 0.2 mg by mouth 2 (two) times daily.   Yes Historical Provider, MD  colchicine 0.6 MG tablet Take 0.6 mg by mouth daily as needed (for gout). 04/13/13  Yes [name], MD  furosemide (LASIX) 80 MG tablet Take 80 mg by mouth daily.   Yes Historical Provider, MD  labetalol (NORMODYNE) 200 MG tablet Take 200 mg by mouth 3 (three) times daily.   Yes Historical Provider, MD  lisinopril (PRINIVIL,ZESTRIL) 10 MG tablet Take 10 mg by mouth daily. 05/11/13  Yes Historical Provider, MD  minoxidil (LONITEN) 2.5 MG tablet Take 2.5 mg by mouth  daily.  10/12/13  Yes Historical Provider, MD  mycophenolate (CELLCEPT) 250 MG capsule Take 250 mg by mouth 2 (two) times daily.   Yes Historical Provider, MD  omeprazole (PRILOSEC) 20 MG capsule Take 20 mg by mouth daily. 07/01/13  Yes Historical Provider, MD  oxyCODONE-acetaminophen (PERCOCET) 10-325 MG per tablet Take 1 tablet by mouth every 4 (four) hours as needed for pain.   Yes Historical Provider, MD  predniSONE (DELTASONE) 5 MG tablet Take 15 mg by mouth daily with breakfast.   Yes Historical Provider, MD  sevelamer carbonate (RENVELA) 800 MG tablet Take 800 mg by mouth 4 (four) times daily - after meals and at bedtime.   Yes Historical Provider, MD  sulfamethoxazole-trimethoprim (BACTRIM,SEPTRA) 400-80 MG per tablet Take 1 tablet by mouth every Monday, Wednesday, and Friday. 07/01/13  Yes Historical Provider, MD  tacrolimus (PROGRAF) 5 MG capsule Take 10 mg by mouth 2 (two) times daily.    Yes Historical Provider, MD  valGANciclovir (VALCYTE) 450 MG tablet Take 450 mg by mouth every Monday, Wednesday, and Friday.   Yes Historical Provider, MD    Allergies  Allergen Reactions  . Ferrlecit [Na Ferric Gluc Cplx In Sucrose] Shortness Of Breath, Swelling and Other (See Comments)    Swelling in throat  . Darvocet [Propoxyphene N-Acetaminophen] Hives    Physical Exam  Vitals  Blood pressure 177/108, pulse 79, temperature 97.4 F (36.3 C), temperature source Oral, resp. rate 21, SpO2 100.00%.   1. General middle-aged African American male lying in bed in NAD,    2. Normal affect and insight, Not Suicidal or Homicidal, Awake Alert, Oriented X 3.  3. No F.N deficits, ALL C.Nerves Intact, Strength 5/5 all 4 extremities, Sensation intact all 4 extremities, Plantars down going.  4. Ears and Eyes appear Normal, Conjunctivae clear, PERRLA. Moist Oral Mucosa.  5. Supple Neck, elevated JVD, No cervical lymphadenopathy appriciated, No Carotid Bruits.  6. Symmetrical Chest wall movement,  Good air movement bilaterally, few bilateral crackles  7. RRR, No Gallops, Rubs or Murmurs, No Parasternal Heave.  8. Positive Bowel Sounds, Abdomen Soft, Non tender, No organomegaly appriciated,No rebound -guarding or rigidity.  9.  No Cyanosis, Normal Skin Turgor, No Skin Rash or Bruise.  10. Good muscle tone,  joints appear normal , no effusions, Normal ROM.  11. No Palpable Lymph Nodes in Neck or Axillae     Data Review  CBC  Recent Labs Lab 10/17/13 1023  WBC 5.5  HGB 11.9*  HCT 36.7*  PLT 193  MCV 78.4  MCH 25.4*  MCHC 32.4  RDW 18.3*  LYMPHSABS 2.0  MONOABS 0.4  EOSABS 0.2  BASOSABS 0.0   ------------------------------------------------------------------------------------------------------------------  Chemistries   Recent Labs Lab 10/17/13 1023  NA 135*  K 5.6*  CL 101  CO2 16*  GLUCOSE 83  BUN 69*  CREATININE 15.95*  CALCIUM 9.3  AST 15  ALT 10  ALKPHOS 98  BILITOT 0.7   ------------------------------------------------------------------------------------------------------------------ CrCl is unknown because both a height and weight (above a minimum accepted value) are required for this calculation. ------------------------------------------------------------------------------------------------------------------ No results found for this basename: TSH, T4TOTAL, FREET3, T3FREE, THYROIDAB,  in the last 72 hours   Coagulation profile No results found for this basename: INR, PROTIME,  in the last 168 hours ------------------------------------------------------------------------------------------------------------------- No results found for this basename: DDIMER,  in the last 72 hours -------------------------------------------------------------------------------------------------------------------  Cardiac Enzymes No results found for this basename: CK, CKMB, TROPONINI, MYOGLOBIN,  in the last 168  hours ------------------------------------------------------------------------------------------------------------------ No components found with this basename: POCBNP,    ---------------------------------------------------------------------------------------------------------------  Urinalysis    Component Value Date/Time   COLORURINE YELLOW 08/31/2012 1948   APPEARANCEUR CLOUDY* 08/31/2012 1948   LABSPEC 1.011 08/31/2012 1948   PHURINE 6.5 08/31/2012 1948   GLUCOSEU NEGATIVE 08/31/2012 1948   HGBUR SMALL* 08/31/2012 1948   BILIRUBINUR NEGATIVE 08/31/2012 1948   KETONESUR NEGATIVE 08/31/2012 1948   PROTEINUR 100* 08/31/2012 1948   UROBILINOGEN 0.2 08/31/2012 1948   NITRITE NEGATIVE 08/31/2012 1948   LEUKOCYTESUR LARGE* 08/31/2012 1948    ----------------------------------------------------------------------------------------------------------------  Imaging results:   Dg Chest 2 View  10/17/2013   CLINICAL DATA:  Weakness and cough.  EXAM: CHEST  2 VIEW  COMPARISON:  09/16/2013  FINDINGS: Dialysis catheter tip at the superior cavoatrial junction and unchanged. Prominent central vascular structures without focal airspace disease or overt pulmonary edema. Heart size is enlarged but stable. Cannot exclude tiny effusions.  IMPRESSION: Stable cardiomegaly.  Question tiny effusions but no significant pulmonary edema.  Stable position of the dialysis catheter.   Electronically Signed   By: Markus Daft M.D.   On: 10/17/2013 10:48         Assessment & Plan     1. Acute respiratory failure due to combination of acute bronchitis and mild fluid overload due to missed dialysis acute on chronic diastolic CHF with EF 82% in 2014 - Will be admitted to a telemetry bed at [facility], I have informed renal already dialysis will be performed today, will obtain sputum cultures place on IV doxycycline for now. Nebulizer treatments and oxygen as needed.    2. ESRD in a patient with failed kidney  transplant. Continue transplant medications per renal, dialysis Tuesday Thursday Saturday, missed dialysis yesterday, will get urgent dialysis today, counseled on compliance with dialysis and medications.    3. Hypertension. Currently in hypertensive urgency/crisis, IV hydralazine given in the ER, will give 1 dose of Catapres by mouth, he is on multiple home medications which will be resumed after dialysis. When necessary IV hydralazine ordered with parameters. Blood pressure currently improving bed side it is 190/100. Will apply nitro paste for now.    4. Mild acute on chronic diastolic CHF. EF 60% in 2014 - plan as in #1 above. In fluid overload due to missed dialysis. Home dose beta blocker ACE inhibitor to be resumed after dialysis. Nitro paste for now.     5. Questionable  H/O of DM-2. Per patient on diet control, outpatient monitoring, CBG stable here. Check a1c.     DVT Prophylaxis Heparin   AM Labs Ordered, also please review Full Orders  Family Communication: Admission, patients condition and plan of care including tests being ordered have been discussed with the patient and  who indicates understanding and agree with the plan and Code Status.  Code Status Full  Likely DC to  Home  Condition GUARDED     Time spent in minutes : 35    Sherilee Smotherman K M.D on 10/17/2013 at 11:58 AM  Between 7am to 7pm - Pager - [phone]  After 7pm go to [email or web address] - password [email or web address]  And look for the night coverage person covering me after hours  [facility] Office  [phone]

## 2013-10-17 NOTE — Procedures (Signed)
I was present at this dialysis session, have reviewed the session itself and made  appropriate changes  Kelly Splinter MD (pgr) [phone]    ([phone] 10/17/2013, 8:43 PM

## 2013-10-17 NOTE — Consult Note (Signed)
Renal Service Consult Note [facility]  Frank [name] 10/17/2013 Frank [name] Requesting Physician:  Dr Candiss Norse  Reason for Consult:  ESRD pt from [facility] missed HD, here SOB , cough HPI: The patient is a 48 y.o. year-old with hx of HTN, ESRD, failed renal Tx presenting with SOB x 2 days , cough with whitish sputum.  No fever, no chills.  No purulent sputum.  He missed Sat HD yesterday due to not feeling well.  No CP.  CXR read as no edema but looks like vasc congestion/ early edema on the R to me.    ROS  no jt pain  no abd pain  no n/v/d  no skin rash  Past Medical History  Past Medical History  Diagnosis Date  . Hypertension   . ESRD (end stage renal disease)     due to HTN per patient, followed at [facility], s/p failed kidney transplant  . Depression   . Complication of anesthesia     itching, sore throat  . Diabetes mellitus without complication     No history per patient, but remains under history as A1c would not be accurate given on dialysis   Past Surgical History  Past Surgical History  Procedure Laterality Date  . Kidney receipient  2006    failed and started HD in March 2014  . Capd insertion    . Capd removal     Family History No family history on file. Social History  reports that he has quit smoking. His smoking use included Cigarettes. He has a 1 pack-year smoking history. He has never used smokeless tobacco. He reports that he does not drink alcohol or use illicit drugs. Allergies  Allergies  Allergen Reactions  . Ferrlecit [Na Ferric Gluc Cplx In Sucrose] Shortness Of Breath, Swelling and Other (See Comments)    Swelling in throat  . Darvocet [Propoxyphene N-Acetaminophen] Hives   Home medications Prior to Admission medications   Medication Sig Start Date End Date Taking? Authorizing Provider  allopurinol (ZYLOPRIM) 100 MG tablet Take 1 tablet (100 mg total) by mouth daily. 04/21/13  Yes [name], MD  amLODipine (NORVASC) 5 MG  tablet Take 10 mg by mouth daily.   Yes Historical Provider, MD  benzonatate (TESSALON) 100 MG capsule Take 2 capsules (200 mg total) by mouth 2 (two) times daily as needed for cough. 09/07/13  Yes Lauren Burnetta Sabin, PA-C  cinacalcet (SENSIPAR) 30 MG tablet Take 30 mg by mouth daily.   Yes Historical Provider, MD  cloNIDine (CATAPRES) 0.2 MG tablet Take 0.2 mg by mouth 2 (two) times daily.   Yes Historical Provider, MD  colchicine 0.6 MG tablet Take 0.6 mg by mouth daily as needed (for gout). 04/13/13  Yes [name], MD  furosemide (LASIX) 80 MG tablet Take 80 mg by mouth daily.   Yes Historical Provider, MD  labetalol (NORMODYNE) 200 MG tablet Take 200 mg by mouth 3 (three) times daily.   Yes Historical Provider, MD  lisinopril (PRINIVIL,ZESTRIL) 10 MG tablet Take 10 mg by mouth daily. 05/11/13  Yes Historical Provider, MD  minoxidil (LONITEN) 2.5 MG tablet Take 2.5 mg by mouth daily.  10/12/13  Yes Historical Provider, MD  mycophenolate (CELLCEPT) 250 MG capsule Take 250 mg by mouth 2 (two) times daily.   Yes Historical Provider, MD  omeprazole (PRILOSEC) 20 MG capsule Take 20 mg by mouth daily. 07/01/13  Yes Historical Provider, MD  oxyCODONE-acetaminophen (PERCOCET) 10-325 MG per tablet Take  1 tablet by mouth every 4 (four) hours as needed for pain.   Yes Historical Provider, MD  predniSONE (DELTASONE) 5 MG tablet Take 15 mg by mouth daily with breakfast.   Yes Historical Provider, MD  sevelamer carbonate (RENVELA) 800 MG tablet Take 800 mg by mouth 4 (four) times daily - after meals and at bedtime.   Yes Historical Provider, MD  sulfamethoxazole-trimethoprim (BACTRIM,SEPTRA) 400-80 MG per tablet Take 1 tablet by mouth every Monday, Wednesday, and Friday. 07/01/13  Yes Historical Provider, MD  tacrolimus (PROGRAF) 5 MG capsule Take 10 mg by mouth 2 (two) times daily.    Yes Historical Provider, MD  valGANciclovir (VALCYTE) 450 MG tablet Take 450 mg by mouth every Monday, Wednesday, and Friday.    Yes Historical Provider, MD   Liver Function Tests  Recent Labs Lab 10/17/13 1023  AST 15  ALT 10  ALKPHOS 98  BILITOT 0.7  PROT 8.8*  ALBUMIN 3.8   No results found for this basename: LIPASE, AMYLASE,  in the last 168 hours CBC  Recent Labs Lab 10/17/13 1023  WBC 5.5  NEUTROABS 2.9  HGB 11.9*  HCT 36.7*  MCV 78.4  PLT 409   Basic Metabolic Panel  Recent Labs Lab 10/17/13 1023  NA 135*  K 5.6*  CL 101  CO2 16*  GLUCOSE 83  BUN 69*  CREATININE 15.95*  CALCIUM 9.3    Filed Vitals:   10/17/13 1230 10/17/13 1300 10/17/13 1330 10/17/13 1506  BP: 177/121 176/119 169/120 178/129  Pulse:    82  Temp:      TempSrc:      Resp: _0 SpO2:    99%   Exam: Alert, WD WN AAM, coughs occ, no distress No rash, cyanosis or gangrene Sclera anicteric, throat clear No gross JVD Bilat coarse exp wheezing throughout post lung fields, occ insp rales at bases RRR no MRG Abd soft, scaphoid, NTND, no ascites GU normal  No LE or UE edema R chest tunneled HD cath clean exit site Neuro is nf, Ox3   K+ 5.6  Dialysis: TTS at [facility] 4 hours   Assessment: 1 Dyspnea / vol overload / acute bronchitis 2 ESRD missed last HD yesterday 3 HTN urgency- on 5 bp meds at home , including minoxidil 4 Hx gout 5 MBD- on sensipar, Renvela 6 Hx failed renal transplant- on slow taper of prograf, pred, cellcept, need to clarify doses with patient 7 Hyperkalemia, mild  Plan- HD tonight, max UF as BP tolerates   Kelly Splinter MD (pgr) [phone]    (c) [phone] 10/17/2013, 3:53 PM

## 2013-10-17 NOTE — ED Notes (Signed)
Pt on dialysis. Supposed to have dialysis yesterday, but missed. Pt scheduled to have dialysis 3x a week.

## 2013-10-17 NOTE — ED Provider Notes (Signed)
CSN: [id number]     Arrival date & time 10/17/13  0355 History   First MD Initiated Contact with Patient 10/17/13 1006     Chief Complaint  Patient presents with  . Shortness of Breath     (Consider location/radiation/quality/duration/timing/severity/associated sxs/prior Treatment) The history is provided by the patient.  Frank [name] is a 48 y.o. male hx of HTN, ESRD on HD (last dialysis was 4 days ago), here with shortness of breath, sinus congestion. Sinus congestion and post nasal drip for several days. Also increasing shortness of breath for the last 3 days. Shortness of breath worse with exertion and laying down. He has been having non productive cough as well. Denies fevers. He had to miss dialysis yesterday since he hasn't been feeling well. Also increasing leg swelling. He has been urinating more than usual. He was admitted a month ago for similar symptoms and was diagnosed with hypertensive urgency from pulmonary edema.    Past Medical History  Diagnosis Date  . Hypertension   . ESRD (end stage renal disease)     due to HTN per patient, followed at [facility], s/p failed kidney transplant  . Depression   . Complication of anesthesia     itching, sore throat  . Diabetes mellitus without complication     No history per patient, but remains under history as A1c would not be accurate given on dialysis   Past Surgical History  Procedure Laterality Date  . Kidney receipient  2006    failed and started HD in March 2014  . Capd insertion    . Capd removal     No family history on file. History  Substance Use Topics  . Smoking status: Former Smoker -- 1.00 packs/day for 1 years    Types: Cigarettes  . Smokeless tobacco: Never Used     Comment: quit Jan 2014  . Alcohol Use: No    Review of Systems  Respiratory: Positive for shortness of breath.   All other systems reviewed and are negative.      Allergies  Ferrlecit and Darvocet  Home Medications   Current  Outpatient Rx  Name  Route  Sig  Dispense  Refill  . allopurinol (ZYLOPRIM) 100 MG tablet   Oral   Take 1 tablet (100 mg total) by mouth daily.   30 tablet   6   . amLODipine (NORVASC) 5 MG tablet   Oral   Take 10 mg by mouth daily.         . benzonatate (TESSALON) 100 MG capsule   Oral   Take 2 capsules (200 mg total) by mouth 2 (two) times daily as needed for cough.   20 capsule   0   . cinacalcet (SENSIPAR) 30 MG tablet   Oral   Take 30 mg by mouth daily.         . cloNIDine (CATAPRES) 0.2 MG tablet   Oral   Take 0.2 mg by mouth 2 (two) times daily.         . colchicine 0.6 MG tablet   Oral   Take 0.6 mg by mouth daily as needed (for gout).         . furosemide (LASIX) 80 MG tablet   Oral   Take 80 mg by mouth daily.         [name] labetalol (NORMODYNE) 200 MG tablet   Oral   Take 200 mg by mouth 3 (three) times daily.         [name]  lisinopril (PRINIVIL,ZESTRIL) 10 MG tablet   Oral   Take 10 mg by mouth daily.         . minoxidil (LONITEN) 2.5 MG tablet   Oral   Take 2.5 mg by mouth daily.          . mycophenolate (CELLCEPT) 250 MG capsule   Oral   Take 250 mg by mouth 2 (two) times daily.         [name] omeprazole (PRILOSEC) 20 MG capsule   Oral   Take 20 mg by mouth daily.         [name] oxyCODONE-acetaminophen (PERCOCET) 10-325 MG per tablet   Oral   Take 1 tablet by mouth every 4 (four) hours as needed for pain.         . predniSONE (DELTASONE) 5 MG tablet   Oral   Take 15 mg by mouth daily with breakfast.         . sevelamer carbonate (RENVELA) 800 MG tablet   Oral   Take 800 mg by mouth 4 (four) times daily - after meals and at bedtime.         . sulfamethoxazole-trimethoprim (BACTRIM,SEPTRA) 400-80 MG per tablet   Oral   Take 1 tablet by mouth every Monday, Wednesday, and Friday.         . tacrolimus (PROGRAF) 5 MG capsule   Oral   Take 10 mg by mouth 2 (two) times daily.          . valGANciclovir (VALCYTE) 450 MG tablet    Oral   Take 450 mg by mouth every Monday, Wednesday, and Friday.          BP 194/125  Pulse 79  Temp(Src) 97.4 F (36.3 C) (Oral)  Resp 23  SpO2 100% Physical Exam  Nursing note and vitals reviewed. Constitutional: He is oriented to person, place, and time.  Slightly tachypneic, chronically ill   HENT:  Head: Normocephalic.  Mouth/Throat: Oropharynx is clear and moist.  Eyes: Conjunctivae are normal. Pupils are equal, round, and reactive to light.  Neck: Normal range of motion. Neck supple.  Cardiovascular: Normal rate, regular rhythm and normal heart sounds.   Pulmonary/Chest:  Slightly tachypneic, + crackles bilateral bases   Abdominal: Soft. Bowel sounds are normal. He exhibits no distension. There is no tenderness. There is no rebound.  Musculoskeletal: Normal range of motion.  1+ edema bilaterally   Neurological: He is alert and oriented to person, place, and time. No cranial nerve deficit. Coordination normal.  Skin: Skin is warm and dry.  Psychiatric: He has a normal mood and affect. His behavior is normal. Judgment and thought content normal.    ED Course  Procedures (including critical care time)  CRITICAL CARE Performed by: Darl Householder, Orabelle Rylee   Total critical care time: 30 min   Critical care time was exclusive of separately billable procedures and treating other patients.  Critical care was necessary to treat or prevent imminent or life-threatening deterioration.  Critical care was time spent personally by me on the following activities: development of treatment plan with patient and/or surrogate as well as nursing, discussions with consultants, evaluation of patient's response to treatment, examination of patient, obtaining history from patient or surrogate, ordering and performing treatments and interventions, ordering and review of laboratory studies, ordering and review of radiographic studies, pulse oximetry and re-evaluation of patient's condition.   Labs  Review Labs Reviewed  CBC WITH DIFFERENTIAL - Abnormal; Notable for the following:    Hemoglobin 11.9 (*)  HCT 36.7 (*)    MCH 25.4 (*)    RDW 18.3 (*)    All other components within normal limits  COMPREHENSIVE METABOLIC PANEL - Abnormal; Notable for the following:    Sodium 135 (*)    Potassium 5.6 (*)    CO2 16 (*)    BUN 69 (*)    Creatinine, Ser 15.95 (*)    Total Protein 8.8 (*)    GFR calc non Af Amer 3 (*)    GFR calc Af Amer 4 (*)    All other components within normal limits  PRO B NATRIURETIC PEPTIDE - Abnormal; Notable for the following:    Pro B Natriuretic peptide (BNP) >70000.0 (*)    All other components within normal limits  URINALYSIS, ROUTINE W REFLEX MICROSCOPIC  CBG MONITORING, ED  Randolm Idol, ED   Imaging Review Dg Chest 2 View  10/17/2013   CLINICAL DATA:  Weakness and cough.  EXAM: CHEST  2 VIEW  COMPARISON:  09/16/2013  FINDINGS: Dialysis catheter tip at the superior cavoatrial junction and unchanged. Prominent central vascular structures without focal airspace disease or overt pulmonary edema. Heart size is enlarged but stable. Cannot exclude tiny effusions.  IMPRESSION: Stable cardiomegaly.  Question tiny effusions but no significant pulmonary edema.  Stable position of the dialysis catheter.   Electronically Signed   By: Markus Daft M.D.   On: 10/17/2013 10:48     EKG Interpretation None      MDM   Final diagnoses:  None   Frank [name] is a 48 y.o. male here with SOB. I am concerned for possible CHF exacerbation. Also hypertensive here. Will check labs, BNP, CXR.   11:46 AM Patient in CHF exacerbation. K 5.6. Also hypertensive. Started on nitro drip for chf exacerbation. Given lasix. I called Dr. Melvia Heaps from Renal who will see patient at [facility]. I called Dr. Candiss Norse, who will admit and transfer to [facility].     Wandra Arthurs, MD 10/17/13 1149

## 2013-10-17 NOTE — ED Notes (Signed)
IV RN at bedside

## 2013-10-18 LAB — CBC
HCT: 34.3 % — ABNORMAL LOW (ref 39.0–52.0)
HEMOGLOBIN: 11.3 g/dL — AB (ref 13.0–17.0)
MCH: 25.7 pg — ABNORMAL LOW (ref 26.0–34.0)
MCHC: 32.9 g/dL (ref 30.0–36.0)
MCV: 78 fL (ref 78.0–100.0)
Platelets: 186 10*3/uL (ref 150–400)
RBC: 4.4 MIL/uL (ref 4.22–5.81)
RDW: 17.8 % — AB (ref 11.5–15.5)
WBC: 4.3 10*3/uL (ref 4.0–10.5)

## 2013-10-18 LAB — BASIC METABOLIC PANEL
BUN: 31 mg/dL — ABNORMAL HIGH (ref 6–23)
CHLORIDE: 99 meq/L (ref 96–112)
CO2: 25 mEq/L (ref 19–32)
CREATININE: 9.41 mg/dL — AB (ref 0.50–1.35)
Calcium: 8.4 mg/dL (ref 8.4–10.5)
GFR calc non Af Amer: 6 mL/min — ABNORMAL LOW (ref >90)
GFR, EST AFRICAN AMERICAN: 7 mL/min — AB (ref >90)
Glucose, Bld: 94 mg/dL (ref 70–99)
POTASSIUM: 4.2 meq/L (ref 3.7–5.3)
Sodium: 138 mEq/L (ref 137–147)

## 2013-10-18 LAB — URINALYSIS, ROUTINE W REFLEX MICROSCOPIC
Bilirubin Urine: NEGATIVE
Glucose, UA: 100 mg/dL — AB
Ketones, ur: NEGATIVE mg/dL
LEUKOCYTES UA: NEGATIVE
NITRITE: NEGATIVE
Protein, ur: 100 mg/dL — AB
Specific Gravity, Urine: 1.008 (ref 1.005–1.030)
UROBILINOGEN UA: 0.2 mg/dL (ref 0.0–1.0)
pH: 8.5 — ABNORMAL HIGH (ref 5.0–8.0)

## 2013-10-18 LAB — HEPATITIS B SURFACE ANTIGEN: HEP B S AG: NEGATIVE

## 2013-10-18 LAB — URINE MICROSCOPIC-ADD ON

## 2013-10-18 MED ORDER — CLONIDINE HCL 0.3 MG PO TABS
0.3000 mg | ORAL_TABLET | Freq: Three times a day (TID) | ORAL | Status: DC
  Administered 2013-10-18: 0.3 mg via ORAL
  Filled 2013-10-18 (×3): qty 1

## 2013-10-18 MED ORDER — PNEUMOCOCCAL VAC POLYVALENT 25 MCG/0.5ML IJ INJ: 0.5000 mL | INJECTION | INTRAMUSCULAR | Status: DC

## 2013-10-18 MED ORDER — DOXYCYCLINE HYCLATE 100 MG PO CAPS: 100.0000 mg | ORAL_CAPSULE | Freq: Two times a day (BID) | ORAL | Status: DC

## 2013-10-18 MED ORDER — CLONIDINE HCL 0.3 MG PO TABS: 0.3000 mg | ORAL_TABLET | Freq: Three times a day (TID) | ORAL | Status: DC

## 2013-10-18 NOTE — Progress Notes (Signed)
Pt discharged to home. D/c instructions given. No questions verbalized.

## 2013-10-18 NOTE — Discharge Instructions (Signed)
Follow with Primary MD Adin Hector in 7 days   Get CBC, CMP, checked 7 days by Primary MD and again as instructed by your Primary MD. Get a 2 view Chest X ray done next visit if you had Pneumonia of Lung problems at the Hospital.   Activity: As tolerated with Full fall precautions use walker/cane & assistance as needed   Disposition Home    Diet: Renal with 1.2lit/day fluid restriction  Check your Weight same time everyday, if you gain over 2 pounds, or you develop in leg swelling, experience more shortness of breath or chest pain, call your Primary MD immediately. Follow Cardiac Low Salt Diet and 1.2 lit/day fluid restriction.   On your next visit with her primary care physician please Get Medicines reviewed and adjusted.  Please request your Prim.MD to go over all Hospital Tests and Procedure/Radiological results at the follow up, please get all Hospital records sent to your Prim MD by signing hospital release before you go home.   If you experience worsening of your admission symptoms, develop shortness of breath, life threatening emergency, suicidal or homicidal thoughts you must seek medical attention immediately by calling 911 or calling your MD immediately  if symptoms less severe.  You Must read complete instructions/literature along with all the possible adverse reactions/side effects for all the Medicines you take and that have been prescribed to you. Take any new Medicines after you have completely understood and accpet all the possible adverse reactions/side effects.   Do not drive and provide baby sitting services if your were admitted for syncope or siezures until you have seen by Primary MD or a Neurologist and advised to do so again.  Do not drive when taking Pain medications.    Do not take more than prescribed Pain, Sleep and Anxiety Medications  Special Instructions: If you have smoked or chewed Tobacco  in the last 2 yrs please stop smoking, stop any  regular Alcohol  and or any Recreational drug use.  Wear Seat belts while driving.   Please note  You were cared for by a hospitalist during your hospital stay. If you have any questions about your discharge medications or the care you received while you were in the hospital after you are discharged, you can call the unit and asked to speak with the hospitalist on call if the hospitalist that took care of you is not available. Once you are discharged, your primary care physician will handle any further medical issues. Please note that NO REFILLS for any discharge medications will be authorized once you are discharged, as it is imperative that you return to your primary care physician (or establish a relationship with a primary care physician if you do not have one) for your aftercare needs so that they can reassess your need for medications and monitor your lab values.

## 2013-10-18 NOTE — Discharge Summary (Signed)
Frank [name], is a 48 y.o. male  DOB 1966-01-29  MRN [id number].  Admission date:  10/17/2013  Admitting Physician  [name], MD  Discharge Date:  10/18/2013   Primary MD  Marcelle Overlie [name]  Recommendations for primary care physician for things to follow:   Monitor blood pressure and medication compliance   Admission Diagnosis  Acute respiratory failure [518.81] Hyperkalemia [276.7] CHF (congestive heart failure) [428.0] ESRD on dialysis [585.6] Hypertensive urgency [401.9] Hypertensive emergency [401.9] Gout [274.9]   Discharge Diagnosis  Acute respiratory failure [518.81] Hyperkalemia [276.7] CHF (congestive heart failure) [428.0] ESRD on dialysis [585.6] Hypertensive urgency [401.9] Hypertensive emergency [401.9] Gout [274.9]    Principal Problem:   Acute respiratory failure Active Problems:   Essential hypertension, malignant   ESRD on dialysis- Tues, Thurs, Sat in [employer], nephro: [name]   Secondary hyperparathyroidism (of renal origin)   Gout   CHF (congestive heart failure)      Past Medical History  Diagnosis Date  . Hypertension   . ESRD (end stage renal disease)     due to HTN per patient, followed at [facility], s/p failed kidney transplant  . Depression   . Complication of anesthesia     itching, sore throat  . Diabetes mellitus without complication     No history per patient, but remains under history as A1c would not be accurate given on dialysis    Past Surgical History  Procedure Laterality Date  . Kidney receipient  2006    failed and started HD in March 2014  . Capd insertion    . Capd removal       Discharge Condition: stable   Follow UP  Follow-up Information   Follow up with Adin Hector. Schedule an appointment as soon as possible for a visit in 1  week. (and Kidney doctor)    Specialty:  General Practice        Discharge Instructions  and  Discharge Medications         Discharge Orders   Future Orders Complete By Expires   Diet - low sodium heart healthy  As directed    Discharge instructions  As directed    Comments:     Follow with Primary MD Adin Hector in 7 days   Get CBC, CMP, checked 7 days by Primary MD and again as instructed by your Primary MD. Get a 2 view Chest X ray done next visit if you had Pneumonia of Lung problems at the Hospital.   Activity: As tolerated with Full fall precautions use walker/cane & assistance as needed   Disposition Home    Diet: Renal with 1.2lit/day fluid restriction  Check your Weight same time everyday, if you gain over 2 pounds, or you develop in leg swelling, experience more shortness of breath or chest pain, call your Primary MD immediately. Follow Cardiac Low Salt Diet and 1.2 lit/day fluid restriction.   On your next visit with her primary care physician please Get Medicines reviewed  and adjusted.  Please request your Prim.MD to go over all Hospital Tests and Procedure/Radiological results at the follow up, please get all Hospital records sent to your Prim MD by signing hospital release before you go home.   If you experience worsening of your admission symptoms, develop shortness of breath, life threatening emergency, suicidal or homicidal thoughts you must seek medical attention immediately by calling 911 or calling your MD immediately  if symptoms less severe.  You Must read complete instructions/literature along with all the possible adverse reactions/side effects for all the Medicines you take and that have been prescribed to you. Take any new Medicines after you have completely understood and accpet all the possible adverse reactions/side effects.   Do not drive and provide baby sitting services if your were admitted for syncope or siezures until you have  seen by Primary MD or a Neurologist and advised to do so again.  Do not drive when taking Pain medications.    Do not take more than prescribed Pain, Sleep and Anxiety Medications  Special Instructions: If you have smoked or chewed Tobacco  in the last 2 yrs please stop smoking, stop any regular Alcohol  and or any Recreational drug use.  Wear Seat belts while driving.   Please note  You were cared for by a hospitalist during your hospital stay. If you have any questions about your discharge medications or the care you received while you were in the hospital after you are discharged, you can call the unit and asked to speak with the hospitalist on call if the hospitalist that took care of you is not available. Once you are discharged, your primary care physician will handle any further medical issues. Please note that NO REFILLS for any discharge medications will be authorized once you are discharged, as it is imperative that you return to your primary care physician (or establish a relationship with a primary care physician if you do not have one) for your aftercare needs so that they can reassess your need for medications and monitor your lab values.   Increase activity slowly  As directed        Medication List         allopurinol 100 MG tablet  Commonly known as:  ZYLOPRIM  Take 1 tablet (100 mg total) by mouth daily.     amLODipine 5 MG tablet  Commonly known as:  NORVASC  Take 10 mg by mouth daily.     benzonatate 100 MG capsule  Commonly known as:  TESSALON  Take 2 capsules (200 mg total) by mouth 2 (two) times daily as needed for cough.     cinacalcet 30 MG tablet  Commonly known as:  SENSIPAR  Take 30 mg by mouth daily.     cloNIDine 0.3 MG tablet  Commonly known as:  CATAPRES  Take 1 tablet (0.3 mg total) by mouth 3 (three) times daily.     colchicine 0.6 MG tablet  Take 0.6 mg by mouth daily as needed (for gout).     doxycycline 100 MG capsule  Commonly known  as:  VIBRAMYCIN  Take 1 capsule (100 mg total) by mouth 2 (two) times daily.     furosemide 80 MG tablet  Commonly known as:  LASIX  Take 80 mg by mouth daily.     labetalol 200 MG tablet  Commonly known as:  NORMODYNE  Take 200 mg by mouth 3 (three) times daily.     lisinopril 10 MG tablet  Commonly known as:  PRINIVIL,ZESTRIL  Take 10 mg by mouth daily.     minoxidil 2.5 MG tablet  Commonly known as:  LONITEN  Take 2.5 mg by mouth daily.     mycophenolate 250 MG capsule  Commonly known as:  CELLCEPT  Take 250 mg by mouth 2 (two) times daily.     omeprazole 20 MG capsule  Commonly known as:  PRILOSEC  Take 20 mg by mouth daily.     oxyCODONE-acetaminophen 10-325 MG per tablet  Commonly known as:  PERCOCET  Take 1 tablet by mouth every 4 (four) hours as needed for pain.     predniSONE 5 MG tablet  Commonly known as:  DELTASONE  Take 15 mg by mouth daily with breakfast.     sevelamer carbonate 800 MG tablet  Commonly known as:  RENVELA  Take 800 mg by mouth 4 (four) times daily - after meals and at bedtime.     sulfamethoxazole-trimethoprim 400-80 MG per tablet  Commonly known as:  BACTRIM,SEPTRA  Take 1 tablet by mouth every Monday, Wednesday, and Friday.     tacrolimus 5 MG capsule  Commonly known as:  PROGRAF  Take 10 mg by mouth 2 (two) times daily.     valGANciclovir 450 MG tablet  Commonly known as:  VALCYTE  Take 450 mg by mouth every Monday, Wednesday, and Friday.          Diet and Activity recommendation: See Discharge Instructions above   Consults obtained - Renal   Major procedures and Radiology Reports - PLEASE review detailed and final reports for all details, in brief -      Dg Chest 2 View  10/17/2013   CLINICAL DATA:  Weakness and cough.  EXAM: CHEST  2 VIEW  COMPARISON:  09/16/2013  FINDINGS: Dialysis catheter tip at the superior cavoatrial junction and unchanged. Prominent central vascular structures without focal airspace disease  or overt pulmonary edema. Heart size is enlarged but stable. Cannot exclude tiny effusions.  IMPRESSION: Stable cardiomegaly.  Question tiny effusions but no significant pulmonary edema.  Stable position of the dialysis catheter.   Electronically Signed   By: Markus Daft M.D.   On: 10/17/2013 10:48    Micro Results     Recent Results (from the past 240 hour(s))  CULTURE, EXPECTORATED SPUTUM-ASSESSMENT     Status: None   Collection Time    10/17/13  5:00 PM      Result Value Ref Range Status   Specimen Description SPUTUM   Final   Special Requests NONE   Final   Sputum evaluation     Final   Value: MICROSCOPIC FINDINGS SUGGEST THAT THIS SPECIMEN IS NOT REPRESENTATIVE OF LOWER RESPIRATORY SECRETIONS. PLEASE RECOLLECT.     RESULT CALLED TO BEAL,J RN 2/29/15 [place]   Report Status 10/17/2013 FINAL   Final     History of present illness and  Hospital Course:     Kindly see H&P for history of present illness and admission details, please review complete Labs, Consult reports and Test reports for all details in brief Frank [name], is a 48 y.o. male, patient with history of  renal transplant which failed now status ESRD and was started on dialysis Tuesday Thursday Saturday in 2014, left IJ catheter for dialysis, hypertension in poor control, depression, questionable history of diabetes mellitus on diet control, who missed his dialysis yesterday due to a productive cough which has had for 2-3 days, no fever chills, no chest  pain or palpitations, no abdominal pain diarrhea, no focal weakness.    Came to the ER for shortness of breath, missed dialysis, productive cough, in the ER workup consistent with acute bronchitis, mild fluid overload due to missed her dialysis, case was discussed with nephrology who requested hospitalist admission with transfer to [facility] for [facility].     1. Acute respiratory failure due to combination of acute bronchitis and mild fluid overload due to missed  dialysis acute on chronic diastolic CHF with EF 79% in 2014 - which improved after excess fluid removal from dialysis, no shortness of breath at this time, will give him doxycycline for 5 more days for URI/bronchitis, will follow with PCP and nephrologist. Counseled on compliance with medications and dialysis schedule.    2. ESRD in a patient with failed kidney transplant. Continue transplant medications per renal, dialysis Tuesday Thursday Saturday, missed dialysis yesterday, will get urgent dialysis today, counseled on compliance with dialysis and medications.     3. Hypertension. Poor compliance with blood pressure medications, blood pressure stable will check bedside, he is on 5 different medications already, long history of poor compliance, and increased Catapres dose, he will follow with PCP and renal physicians and get blood pressure monitored and medications adjusted as needed.    4. Mild acute on chronic diastolic CHF. EF 60% in 2014 - resolved after nitro paste and dialysis for fluid removal.     5. Questionable H/O of DM-2. stable a1c.   Lab Results  Component Value Date   HGBA1C 5.9* 10/17/2013      Today   Subjective:   Leaman Abe today has no headache,no chest abdominal pain,no new weakness tingling or numbness, feels much better wants to go home today.    Objective:   Blood pressure 155/95, pulse 85, temperature 97.7 F (36.5 C), temperature source Oral, resp. rate 18, height _0  (1.854 m), weight 73.8 kg (162 lb 11.2 oz), SpO2 96.00%.   Intake/Output Summary (Last 24 hours) at 10/18/13 0730 Last data filed at 10/18/13 0706  Gross per 24 hour  Intake    480 ml  Output   3702 ml  Net  -3222 ml    Exam Awake Alert, Oriented *3, No new F.N deficits, Normal affect [place].AT,PERRAL Supple Neck,No JVD, No cervical lymphadenopathy appriciated.  L IJ Dialysis Cathter Symmetrical Chest wall movement, Good air movement bilaterally, CTAB RRR,No Gallops,Rubs or new  Murmurs, No Parasternal Heave +ve B.Sounds, Abd Soft, Non tender, No organomegaly appriciated, No rebound -guarding or rigidity. No Cyanosis, Clubbing or edema, No new Rash or bruise  Data Review   CBC w Diff: Lab Results  Component Value Date   WBC 3.7* 10/17/2013   HGB 10.2* 10/17/2013   HCT 31.1* 10/17/2013   PLT 180 10/17/2013   LYMPHOPCT 37 10/17/2013   MONOPCT 8 10/17/2013   EOSPCT 3 10/17/2013   BASOPCT 0 10/17/2013    CMP: Lab Results  Component Value Date   NA 135* 10/17/2013   K 5.6* 10/17/2013   CL 101 10/17/2013   CO2 16* 10/17/2013   BUN 69* 10/17/2013   CREATININE 15.50* 10/17/2013   PROT 8.8* 10/17/2013   ALBUMIN 3.8 10/17/2013   BILITOT 0.7 10/17/2013   ALKPHOS 98 10/17/2013   AST 15 10/17/2013   ALT 10 10/17/2013  .   Total Time in preparing paper work, data evaluation and todays exam - 35 minutes  [name] M.D on 10/18/2013 at 7:30 AM  [place]  [phone]

## 2013-10-21 ENCOUNTER — Emergency Department (HOSPITAL_COMMUNITY)
Admission: EM | Admit: 2013-10-21 | Discharge: 2013-10-21 | Disposition: A | Discharge status: Home / Self Care | Payer: Medicare Other | Attending: Emergency Medicine | Admitting: Emergency Medicine

## 2013-10-21 ENCOUNTER — Emergency Department (HOSPITAL_COMMUNITY): Payer: Medicare Other

## 2013-10-21 ENCOUNTER — Encounter (HOSPITAL_COMMUNITY): Payer: Self-pay | Admitting: Emergency Medicine

## 2013-10-21 DIAGNOSIS — I120 Hypertensive chronic kidney disease with stage 5 chronic kidney disease or end stage renal disease: Secondary | ICD-10-CM | POA: Insufficient documentation

## 2013-10-21 DIAGNOSIS — Z79899 Other long term (current) drug therapy: Secondary | ICD-10-CM | POA: Insufficient documentation

## 2013-10-21 DIAGNOSIS — Z87891 Personal history of nicotine dependence: Secondary | ICD-10-CM | POA: Insufficient documentation

## 2013-10-21 DIAGNOSIS — E119 Type 2 diabetes mellitus without complications: Secondary | ICD-10-CM | POA: Insufficient documentation

## 2013-10-21 DIAGNOSIS — Z8659 Personal history of other mental and behavioral disorders: Secondary | ICD-10-CM | POA: Insufficient documentation

## 2013-10-21 DIAGNOSIS — Y9389 Activity, other specified: Secondary | ICD-10-CM | POA: Insufficient documentation

## 2013-10-21 DIAGNOSIS — Y929 Unspecified place or not applicable: Secondary | ICD-10-CM | POA: Insufficient documentation

## 2013-10-21 DIAGNOSIS — S60229A Contusion of unspecified hand, initial encounter: Secondary | ICD-10-CM | POA: Insufficient documentation

## 2013-10-21 DIAGNOSIS — W208XXA Other cause of strike by thrown, projected or falling object, initial encounter: Secondary | ICD-10-CM | POA: Insufficient documentation

## 2013-10-21 DIAGNOSIS — N186 End stage renal disease: Secondary | ICD-10-CM | POA: Insufficient documentation

## 2013-10-21 DIAGNOSIS — IMO0002 Reserved for concepts with insufficient information to code with codable children: Secondary | ICD-10-CM | POA: Insufficient documentation

## 2013-10-21 MED ORDER — HYDROCODONE-ACETAMINOPHEN 5-325 MG PO TABS: 1.0000 | ORAL_TABLET | ORAL | Status: DC | PRN

## 2013-10-21 MED ORDER — ONDANSETRON 4 MG PO TBDP
4.0000 mg | ORAL_TABLET | Freq: Once | ORAL | Status: DC
  Filled 2013-10-21: qty 1

## 2013-10-21 MED ORDER — OXYCODONE-ACETAMINOPHEN 5-325 MG PO TABS
1.0000 | ORAL_TABLET | Freq: Once | ORAL | Status: DC
  Filled 2013-10-21: qty 1

## 2013-10-21 NOTE — ED Notes (Signed)
Patient transported to X-ray 

## 2013-10-21 NOTE — ED Notes (Signed)
Pt. States he was working on his motorcycle and it fell on right hand. Pt. Hand/wrist swollen, radial pulse intact.

## 2013-10-21 NOTE — ED Notes (Signed)
Pt returned from xray

## 2013-10-21 NOTE — Discharge Instructions (Signed)
Hand Contusion A hand contusion is a deep bruise on your hand area. Contusions are the result of an injury that caused bleeding under the skin. The contusion may turn blue, purple, or yellow. Minor injuries will give you a painless contusion, but more severe contusions may stay painful and swollen for a few weeks. CAUSES  A contusion is usually caused by a blow, trauma, or direct force to an area of the body. SYMPTOMS   Swelling and redness of the injured area.  Discoloration of the injured area.  Tenderness and soreness of the injured area.  Pain. DIAGNOSIS  The diagnosis can be made by taking a history and performing a physical exam. An X-ray, CT scan, or MRI may be needed to determine if there were any associated injuries, such as broken bones (fractures). TREATMENT  Often, the best treatment for a hand contusion is resting, elevating, icing, and applying cold compresses to the injured area. Over-the-counter medicines may also be recommended for pain control. HOME CARE INSTRUCTIONS   Put ice on the injured area.  Put ice in a plastic bag.  Place a towel between your skin and the bag.  Leave the ice on for 15-20 minutes, 03-04 times a day.  Only take over-the-counter or prescription medicines as directed by your caregiver. Your caregiver may recommend avoiding anti-inflammatory medicines (aspirin, ibuprofen, and naproxen) for 48 hours because these medicines may increase bruising.  If told, use an elastic wrap as directed. This can help reduce swelling. You may remove the wrap for sleeping, showering, and bathing. If your fingers become numb, cold, or blue, take the wrap off and reapply it more loosely.  Elevate your hand with pillows to reduce swelling.  Avoid overusing your hand if it is painful. SEEK IMMEDIATE MEDICAL CARE IF:   You have increased redness, swelling, or pain in your hand.  Your swelling or pain is not relieved with medicines.  You have loss of feeling in  your hand or are unable to move your fingers.  Your hand turns cold or blue.  You have pain when you move your fingers.  Your hand becomes warm to the touch.  Your contusion does not improve in 2 days. MAKE SURE YOU:   Understand these instructions.  Will watch your condition.  Will get help right away if you are not doing well or get worse. Document Released: 12/28/2001 Document Revised: 04/01/2012 Document Reviewed: 12/30/2011 [facility] Patient Information 2014 [place].

## 2013-10-21 NOTE — ED Provider Notes (Signed)
Medical screening examination/treatment/procedure(s) were performed by non-physician practitioner and as supervising physician I was immediately available for consultation/collaboration.   EKG Interpretation None        Orpah Greek, MD 10/21/13 [phone]

## 2013-10-21 NOTE — ED Notes (Signed)
Pt. States he was working on his motorcycle and it fell onto right hand. States "pain starts in palm and radiates to wrist. Pt. Thumb/hand swollen. Radial pulse intact, cap refill instant, denies numbness/tingling.

## 2013-10-21 NOTE — ED Notes (Signed)
Pt. Denied wanting pain medicine due to driving home.

## 2013-10-21 NOTE — ED Provider Notes (Signed)
CSN: [id number]     Arrival date & time 10/21/13  2137 History  This chart was scribed for non-physician practitioner, Delos Haring, PA-C working with Orpah Greek, MD by Frederich Balding, ED scribe. This patient was seen in room TR08C/TR08C and the patient's care was started at 11:10 PM.   Chief Complaint  Patient presents with  . Hand Injury   The history is provided by the patient. No language interpreter was used.   HPI Comments: Frank [name] is a 48 y.o. male who presents to the Emergency Department complaining of right hand injury. Pt states he was working on his motorcycle and the body of it fell onto his hand and wrist. He has sudden onset, throbbing right hand and wrist pain with associated swelling. Certain movements worsen the pain. He has not taken anything for pain or used ice. Pt states his blood pressure is elevated right now due to pain. He states that he took his blood pressure medications today. No headache, blurry vision or chest pain.  Past Medical History  Diagnosis Date  . Hypertension   . ESRD (end stage renal disease)     due to HTN per patient, followed at [facility], s/p failed kidney transplant  . Depression   . Complication of anesthesia     itching, sore throat  . Diabetes mellitus without complication     No history per patient, but remains under history as A1c would not be accurate given on dialysis   Past Surgical History  Procedure Laterality Date  . Kidney receipient  2006    failed and started HD in March 2014  . Capd insertion    . Capd removal     No family history on file. History  Substance Use Topics  . Smoking status: Former Smoker -- 1.00 packs/day for 1 years    Types: Cigarettes  . Smokeless tobacco: Never Used     Comment: quit Jan 2014  . Alcohol Use: No    Review of Systems  Musculoskeletal: Positive for arthralgias and joint swelling.  All other systems reviewed and are negative.   Allergies  Ferrlecit and  Darvocet  Home Medications   Current Outpatient Rx  Name  Route  Sig  Dispense  Refill  . allopurinol (ZYLOPRIM) 100 MG tablet   Oral   Take 1 tablet (100 mg total) by mouth daily.   30 tablet   6   . amLODipine (NORVASC) 5 MG tablet   Oral   Take 10 mg by mouth daily.         . benzonatate (TESSALON) 100 MG capsule   Oral   Take 2 capsules (200 mg total) by mouth 2 (two) times daily as needed for cough.   20 capsule   0   . cinacalcet (SENSIPAR) 30 MG tablet   Oral   Take 30 mg by mouth daily.         . cloNIDine (CATAPRES) 0.3 MG tablet   Oral   Take 1 tablet (0.3 mg total) by mouth 3 (three) times daily.   90 tablet   0   . colchicine 0.6 MG tablet   Oral   Take 0.6 mg by mouth daily as needed (for gout).         . furosemide (LASIX) 80 MG tablet   Oral   Take 80 mg by mouth daily.         [name] labetalol (NORMODYNE) 200 MG tablet   Oral   Take  200 mg by mouth 3 (three) times daily.         [name] lisinopril (PRINIVIL,ZESTRIL) 10 MG tablet   Oral   Take 10 mg by mouth daily.         . minoxidil (LONITEN) 2.5 MG tablet   Oral   Take 2.5 mg by mouth daily.          [name] omeprazole (PRILOSEC) 20 MG capsule   Oral   Take 20 mg by mouth daily.         [name] oxyCODONE-acetaminophen (PERCOCET) 10-325 MG per tablet   Oral   Take 1 tablet by mouth every 4 (four) hours as needed for pain.         . predniSONE (DELTASONE) 5 MG tablet   Oral   Take 15 mg by mouth daily with breakfast.         . sevelamer carbonate (RENVELA) 800 MG tablet   Oral   Take 800 mg by mouth 4 (four) times daily - after meals and at bedtime.         . sulfamethoxazole-trimethoprim (BACTRIM,SEPTRA) 400-80 MG per tablet   Oral   Take 1 tablet by mouth every Monday, Wednesday, and Friday.         . tacrolimus (PROGRAF) 5 MG capsule   Oral   Take 10 mg by mouth 2 (two) times daily.          . valGANciclovir (VALCYTE) 450 MG tablet   Oral   Take 450 mg by mouth every  Monday, Wednesday, and Friday.         [name] HYDROcodone-acetaminophen (NORCO/VICODIN) 5-325 MG per tablet   Oral   Take 1 tablet by mouth every 4 (four) hours as needed.   20 tablet   0    BP 176/105  Pulse 76  Temp(Src) 99.3 F (37.4 C) (Oral)  Resp 20  Ht _0  (1.854 m)  Wt 175 lb (79.379 kg)  BMI 23.09 kg/m2  SpO2 96%  Physical Exam  Nursing note and vitals reviewed. Constitutional: He is oriented to person, place, and time. He appears well-developed and well-nourished. No distress.  HENT:  Head: Normocephalic and atraumatic.  Eyes: EOM are normal.  Neck: Neck supple. No tracheal deviation present.  Cardiovascular: Normal rate.   Pulmonary/Chest: Effort normal. No respiratory distress.  Musculoskeletal:       Right hand: He exhibits decreased range of motion (due to pain) and tenderness. He exhibits no bony tenderness, normal two-point discrimination, normal capillary refill, no deformity, no laceration and no swelling. Normal sensation noted. Normal strength noted.  Neurological: He is alert and oriented to person, place, and time.  Skin: Skin is warm and dry.  Psychiatric: He has a normal mood and affect. His behavior is normal.    ED Course  Procedures (including critical care time)  DIAGNOSTIC STUDIES: Oxygen Saturation is 96% on RA, normal by my interpretation.    COORDINATION OF CARE: 11:12 PM-Discussed treatment plan which includes wrist splint, pain medication and an anti-inflammatory with pt at bedside and pt agreed to plan. Will give pt referral to hand specialist and advised him to follow up if symptoms do not resolve. xrays negative no signs of emergency conditions present.  Labs Review Labs Reviewed - No data to display Imaging Review Dg Hand Complete Right  10/21/2013   CLINICAL DATA:  Motorcycle fell on right hand; pain at the palm of the right hand, about the first metacarpal.  EXAM: RIGHT HAND -  COMPLETE 3+ VIEW  COMPARISON:  None.  FINDINGS: There  is no evidence of fracture or dislocation. The joint spaces are preserved; the soft tissues are unremarkable in appearance. The carpal rows are intact, and demonstrate normal alignment.  IMPRESSION: No evidence of fracture or dislocation.   Electronically Signed   By: Garald Balding M.D.   On: 10/21/2013 22:15     EKG Interpretation None      MDM   Final diagnoses:  Hand contusion    49 y.o.Shray Hunley Niziolek's evaluation in the Emergency Department is complete. It has been determined that no acute conditions requiring further emergency intervention are present at this time. The patient/guardian have been advised of the diagnosis and plan. We have discussed signs and symptoms that warrant return to the ED, such as changes or worsening in symptoms.  Vital signs are stable at discharge. Filed Vitals:   10/21/13 2146  BP: 176/105  Pulse: 76  Temp: 99.3 F (37.4 C)  Resp: 20    Patient/guardian has voiced understanding and agreed to follow-up with the PCP or specialist.   I personally performed the services described in this documentation, which was scribed in my presence. The recorded information has been reviewed and is accurate.  Linus Mako, PA-C 10/21/13 2330

## 2013-10-29 ENCOUNTER — Encounter (HOSPITAL_COMMUNITY): Payer: Self-pay | Admitting: Emergency Medicine

## 2013-10-29 DIAGNOSIS — F329 Major depressive disorder, single episode, unspecified: Secondary | ICD-10-CM | POA: Insufficient documentation

## 2013-10-29 DIAGNOSIS — R1011 Right upper quadrant pain: Secondary | ICD-10-CM | POA: Insufficient documentation

## 2013-10-29 DIAGNOSIS — I120 Hypertensive chronic kidney disease with stage 5 chronic kidney disease or end stage renal disease: Secondary | ICD-10-CM | POA: Insufficient documentation

## 2013-10-29 DIAGNOSIS — R1013 Epigastric pain: Secondary | ICD-10-CM | POA: Insufficient documentation

## 2013-10-29 DIAGNOSIS — F3289 Other specified depressive episodes: Secondary | ICD-10-CM | POA: Insufficient documentation

## 2013-10-29 DIAGNOSIS — E119 Type 2 diabetes mellitus without complications: Secondary | ICD-10-CM | POA: Insufficient documentation

## 2013-10-29 DIAGNOSIS — Z87891 Personal history of nicotine dependence: Secondary | ICD-10-CM | POA: Insufficient documentation

## 2013-10-29 DIAGNOSIS — Z992 Dependence on renal dialysis: Secondary | ICD-10-CM | POA: Insufficient documentation

## 2013-10-29 DIAGNOSIS — Z79899 Other long term (current) drug therapy: Secondary | ICD-10-CM | POA: Insufficient documentation

## 2013-10-29 DIAGNOSIS — N186 End stage renal disease: Secondary | ICD-10-CM | POA: Insufficient documentation

## 2013-10-29 DIAGNOSIS — Z940 Kidney transplant status: Secondary | ICD-10-CM | POA: Insufficient documentation

## 2013-10-29 DIAGNOSIS — IMO0002 Reserved for concepts with insufficient information to code with codable children: Secondary | ICD-10-CM | POA: Insufficient documentation

## 2013-10-29 LAB — CBC
HEMATOCRIT: 32.3 % — AB (ref 39.0–52.0)
Hemoglobin: 10.4 g/dL — ABNORMAL LOW (ref 13.0–17.0)
MCH: 26.1 pg (ref 26.0–34.0)
MCHC: 32.2 g/dL (ref 30.0–36.0)
MCV: 81 fL (ref 78.0–100.0)
Platelets: 163 10*3/uL (ref 150–400)
RBC: 3.99 MIL/uL — ABNORMAL LOW (ref 4.22–5.81)
RDW: 19.1 % — ABNORMAL HIGH (ref 11.5–15.5)
WBC: 6.5 10*3/uL (ref 4.0–10.5)

## 2013-10-29 NOTE — ED Notes (Signed)
The pt has multi[ple complaints headache bp high chest pain after he took a nap and woke up.  nv no diarrhea no temp

## 2013-10-29 NOTE — ED Notes (Signed)
The pt is a dialysis pt dialyzed last Thursday.  Dialysis cath rt upper chest

## 2013-10-30 ENCOUNTER — Encounter (HOSPITAL_COMMUNITY): Payer: Self-pay | Admitting: Emergency Medicine

## 2013-10-30 ENCOUNTER — Emergency Department (HOSPITAL_COMMUNITY): Payer: Medicare Other

## 2013-10-30 ENCOUNTER — Emergency Department (HOSPITAL_COMMUNITY)
Admission: EM | Admit: 2013-10-30 | Discharge: 2013-10-30 | Disposition: A | Discharge status: Home / Self Care | Payer: Medicare Other | Attending: Emergency Medicine | Admitting: Emergency Medicine

## 2013-10-30 ENCOUNTER — Emergency Department (HOSPITAL_COMMUNITY)
Admission: EM | Admit: 2013-10-30 | Discharge: 2013-10-30 | Disposition: A | Discharge status: Home / Self Care | Payer: Medicare Other | Source: Home / Self Care | Attending: Emergency Medicine | Admitting: Emergency Medicine

## 2013-10-30 DIAGNOSIS — R112 Nausea with vomiting, unspecified: Secondary | ICD-10-CM

## 2013-10-30 DIAGNOSIS — Z87891 Personal history of nicotine dependence: Secondary | ICD-10-CM

## 2013-10-30 DIAGNOSIS — IMO0002 Reserved for concepts with insufficient information to code with codable children: Secondary | ICD-10-CM | POA: Insufficient documentation

## 2013-10-30 DIAGNOSIS — Z79899 Other long term (current) drug therapy: Secondary | ICD-10-CM | POA: Insufficient documentation

## 2013-10-30 DIAGNOSIS — R1013 Epigastric pain: Secondary | ICD-10-CM

## 2013-10-30 DIAGNOSIS — Z8659 Personal history of other mental and behavioral disorders: Secondary | ICD-10-CM

## 2013-10-30 DIAGNOSIS — E119 Type 2 diabetes mellitus without complications: Secondary | ICD-10-CM

## 2013-10-30 DIAGNOSIS — R109 Unspecified abdominal pain: Secondary | ICD-10-CM

## 2013-10-30 DIAGNOSIS — N186 End stage renal disease: Secondary | ICD-10-CM

## 2013-10-30 DIAGNOSIS — I120 Hypertensive chronic kidney disease with stage 5 chronic kidney disease or end stage renal disease: Secondary | ICD-10-CM | POA: Insufficient documentation

## 2013-10-30 LAB — HEPATIC FUNCTION PANEL
ALT: 9 U/L (ref 0–53)
AST: 18 U/L (ref 0–37)
Albumin: 3.3 g/dL — ABNORMAL LOW (ref 3.5–5.2)
Alkaline Phosphatase: 94 U/L (ref 39–117)
BILIRUBIN TOTAL: 0.3 mg/dL (ref 0.3–1.2)
Total Protein: 8.1 g/dL (ref 6.0–8.3)

## 2013-10-30 LAB — LIPASE, BLOOD: LIPASE: 66 U/L — AB (ref 11–59)

## 2013-10-30 LAB — BASIC METABOLIC PANEL
BUN: 27 mg/dL — AB (ref 6–23)
CALCIUM: 9.4 mg/dL (ref 8.4–10.5)
CO2: 22 mEq/L (ref 19–32)
CREATININE: 10.48 mg/dL — AB (ref 0.50–1.35)
Chloride: 102 mEq/L (ref 96–112)
GFR calc Af Amer: 6 mL/min — ABNORMAL LOW (ref >90)
GFR, EST NON AFRICAN AMERICAN: 5 mL/min — AB (ref >90)
Glucose, Bld: 83 mg/dL (ref 70–99)
Potassium: 5.2 mEq/L (ref 3.7–5.3)
Sodium: 142 mEq/L (ref 137–147)

## 2013-10-30 LAB — TROPONIN I

## 2013-10-30 MED ORDER — OMEPRAZOLE 20 MG PO CPDR: 20.0000 mg | DELAYED_RELEASE_CAPSULE | Freq: Every day | ORAL | Status: DC

## 2013-10-30 MED ORDER — ONDANSETRON 4 MG PO TBDP: 4.0000 mg | ORAL_TABLET | Freq: Three times a day (TID) | ORAL | Status: DC | PRN

## 2013-10-30 MED ORDER — PROMETHAZINE HCL 25 MG/ML IJ SOLN
25.0000 mg | Freq: Once | INTRAMUSCULAR | Status: DC
  Filled 2013-10-30: qty 1

## 2013-10-30 MED ORDER — FENTANYL CITRATE 0.05 MG/ML IJ SOLN
50.0000 ug | Freq: Once | INTRAMUSCULAR | Status: AC
  Administered 2013-10-30: 50 ug via INTRAVENOUS
  Filled 2013-10-30: qty 2

## 2013-10-30 MED ORDER — ONDANSETRON 4 MG PO TBDP
8.0000 mg | ORAL_TABLET | Freq: Once | ORAL | Status: AC
  Administered 2013-10-30: 8 mg via ORAL

## 2013-10-30 MED ORDER — ONDANSETRON 4 MG PO TBDP
8.0000 mg | ORAL_TABLET | Freq: Once | ORAL | Status: AC
  Administered 2013-10-30: 8 mg via ORAL
  Filled 2013-10-30: qty 2

## 2013-10-30 NOTE — ED Notes (Signed)
Patient reports pain with deep inspiration

## 2013-10-30 NOTE — ED Provider Notes (Signed)
Medical screening examination/treatment/procedure(s) were performed by non-physician practitioner and as supervising physician I was immediately available for consultation/collaboration.   EKG Interpretation None      Rolland Porter, MD, Abram Sander   Janice Norrie, MD 10/30/13 [phone]

## 2013-10-30 NOTE — Discharge Instructions (Signed)
Take Zofran as needed for nausea. Refer to attached documents for more information. Go directly to dialysis from the ED.

## 2013-10-30 NOTE — ED Notes (Signed)
This RN spoke with Amy, Charge Nurse with [facility] and she states "Frank [name] was most likely not going to come to the get dialysis, he is giving you the run around".  This RN spoke with Frank [name] and he states he is feeling better and will drive for his dialysis treatment today.

## 2013-10-30 NOTE — ED Notes (Signed)
Pt reports that he was seen here for abd pain and CP last night. Pt came back due to lack of transportation to dialysis and continued abd pain. Pt denies CP at this time. Pt usually is dialyzed on Tues., Thurs., Sat. Pt was last dialyzed Tuesday.

## 2013-10-30 NOTE — ED Provider Notes (Signed)
CSN: [id number]     Arrival date & time 10/29/13  2311 History   First MD Initiated Contact with Patient 10/30/13 0051     Chief Complaint  Patient presents with  . Chest Pain     (Consider location/radiation/quality/duration/timing/severity/associated sxs/prior Treatment) HPI  Patient is a 48 yo man with ESRD, HTN and DM. He presents with bilateral lower abdominal pain x 6 days. Pain is moderately severe, aching, constant. Denies nausea and vomiting. No history of similar sx.  When I asked the patient to use his hands to localize site of pain, he points to the RUQ of the abdomen.   Triage nurse documents that the patient had complained of chest pain. But, he denies chest pain during my conversation with him. No nausea or vomiting. No GU sx. No fever. Denies history of similar sx. Last dialyses was yesterday.   Pain is aching, intermittent. Nothing seems to make it worse or better. 7/10 in severity.   Past Medical History  Diagnosis Date  . Hypertension   . ESRD (end stage renal disease)     due to HTN per patient, followed at [facility], s/p failed kidney transplant  . Depression   . Complication of anesthesia     itching, sore throat  . Diabetes mellitus without complication     No history per patient, but remains under history as A1c would not be accurate given on dialysis   Past Surgical History  Procedure Laterality Date  . Kidney receipient  2006    failed and started HD in March 2014  . Capd insertion    . Capd removal     No family history on file. History  Substance Use Topics  . Smoking status: Former Smoker -- 1.00 packs/day for 1 years    Types: Cigarettes  . Smokeless tobacco: Never Used     Comment: quit Jan 2014  . Alcohol Use: No    Review of Systems Ten point review of symptoms performed and is negative with the exception of symptoms noted above.    Allergies  Ferrlecit and Darvocet  Home Medications   Current Outpatient Rx  Name  Route  Sig   Dispense  Refill  . allopurinol (ZYLOPRIM) 100 MG tablet   Oral   Take 1 tablet (100 mg total) by mouth daily.   30 tablet   6   . amLODipine (NORVASC) 5 MG tablet   Oral   Take 10 mg by mouth daily.         . benzonatate (TESSALON) 100 MG capsule   Oral   Take 2 capsules (200 mg total) by mouth 2 (two) times daily as needed for cough.   20 capsule   0   . cinacalcet (SENSIPAR) 30 MG tablet   Oral   Take 30 mg by mouth daily.         . cloNIDine (CATAPRES) 0.3 MG tablet   Oral   Take 1 tablet (0.3 mg total) by mouth 3 (three) times daily.   90 tablet   0   . colchicine 0.6 MG tablet   Oral   Take 0.6 mg by mouth daily as needed (for gout).         . furosemide (LASIX) 80 MG tablet   Oral   Take 80 mg by mouth daily.         [name] HYDROcodone-acetaminophen (NORCO/VICODIN) 5-325 MG per tablet   Oral   Take 1 tablet by mouth every 4 (four) hours as  needed.   20 tablet   0   . labetalol (NORMODYNE) 200 MG tablet   Oral   Take 200 mg by mouth 3 (three) times daily.         [name] lisinopril (PRINIVIL,ZESTRIL) 10 MG tablet   Oral   Take 10 mg by mouth daily.         . minoxidil (LONITEN) 2.5 MG tablet   Oral   Take 2.5 mg by mouth daily.          [name] omeprazole (PRILOSEC) 20 MG capsule   Oral   Take 20 mg by mouth daily.         [name] oxyCODONE-acetaminophen (PERCOCET) 10-325 MG per tablet   Oral   Take 1 tablet by mouth every 4 (four) hours as needed for pain.         . predniSONE (DELTASONE) 5 MG tablet   Oral   Take 15 mg by mouth daily with breakfast.         . sevelamer carbonate (RENVELA) 800 MG tablet   Oral   Take 800 mg by mouth 4 (four) times daily - after meals and at bedtime.         . sulfamethoxazole-trimethoprim (BACTRIM,SEPTRA) 400-80 MG per tablet   Oral   Take 1 tablet by mouth every Monday, Wednesday, and Friday.         . tacrolimus (PROGRAF) 5 MG capsule   Oral   Take 10 mg by mouth 2 (two) times daily.          .  valGANciclovir (VALCYTE) 450 MG tablet   Oral   Take 450 mg by mouth every Monday, Wednesday, and Friday.          BP 181/131  Pulse 77  Temp(Src) 98.7 F (37.1 C) (Oral)  Resp 17  SpO2 98% Physical Exam Gen: well developed and well nourished appearing Head: NCAT Eyes: PERL, EOMI Nose: no epistaixis or rhinorrhea Mouth/throat: mucosa is moist and pink Neck: supple, no stridor Lungs: CTA B, no wheezing, rhonchi or rales CV: regular rate and rythm, good distal pulses.  Abd: soft, ttp over the RUQ, nondistended Back: no ttp, no cva ttp Skin: warm and dry Ext: no edema, normal to inspection Neuro: CN ii-xii grossly intact, no focal deficits Psyche; normal affect,  calm and cooperative.  ED Course  Procedures (including critical care time)  Results for orders placed during the hospital encounter of 10/30/13 (from the past 24 hour(s))  CBC     Status: Abnormal   Collection Time    10/29/13 11:40 PM      Result Value Ref Range   WBC 6.5  4.0 - 10.5 K/uL   RBC 3.99 (*) 4.22 - 5.81 MIL/uL   Hemoglobin 10.4 (*) 13.0 - 17.0 g/dL   HCT 32.3 (*) 39.0 - 52.0 %   MCV 81.0  78.0 - 100.0 fL   MCH 26.1  26.0 - 34.0 pg   MCHC 32.2  30.0 - 36.0 g/dL   RDW 19.1 (*) 11.5 - 15.5 %   Platelets 163  150 - 400 K/uL  BASIC METABOLIC PANEL     Status: Abnormal   Collection Time    10/29/13 11:40 PM      Result Value Ref Range   Sodium 142  137 - 147 mEq/L   Potassium 5.2  3.7 - 5.3 mEq/L   Chloride 102  96 - 112 mEq/L   CO2 22  19 - 32 mEq/L  Glucose, Bld 83  70 - 99 mg/dL   BUN 27 (*) 6 - 23 mg/dL   Creatinine, Ser 10.48 (*) 0.50 - 1.35 mg/dL   Calcium 9.4  8.4 - 10.5 mg/dL   GFR calc non Af Amer 5 (*) >90 mL/min   GFR calc Af Amer 6 (*) >90 mL/min  TROPONIN I     Status: None   Collection Time    10/29/13 11:40 PM      Result Value Ref Range   Troponin I <0.30  <0.30 ng/mL    EKG: NSR, LVH pattern, normal axis, normal intervals, T wave depression in anterolateral leads is  unchanged from most recent EKG.   MDM    ED work up is non-diagnostic. VS remain stable. Patient is feeling better and is stable for discharge with plan to follow up for dialysis, as scheduled.    Elyn Peers, MD 10/31/13 [phone]

## 2013-10-30 NOTE — ED Notes (Signed)
Patient complaining of nausea.  Patient medicated per verbal orders of Dr Cheri Guppy.

## 2013-10-30 NOTE — ED Notes (Signed)
PA was made aware of pt B/P.  Pt give Zofran for the nausea.  Pt was ambulatory with a steady gait to the bathroom, used wheelchair for comfort for pt at discharge.

## 2013-10-30 NOTE — ED Provider Notes (Signed)
CSN: [id number]     Arrival date & time 10/30/13  0708 History   First MD Initiated Contact with Patient 10/30/13 [phone]     Chief Complaint  Patient presents with  . Abdominal Pain     (Consider location/radiation/quality/duration/timing/severity/associated sxs/prior Treatment) HPI Comments: Patient is a 48 year old male with a past medical history of ESRD on Tues/Thurs/Sat dialysis schedule, hypertension and diabetes who presents with continued abdominal pain. Patient was discharged from the ED 3 hours ago for the same symptoms. Patient denies any changes in symptoms. The pain is located in his epigastrium and does not radiate. The pain is described as cramping and severe. The pain started gradually and progressively worsened since the onset. No alleviating/aggravating factors. The patient has tried nothing for symptoms without relief. Associated symptoms include nausea and vomiting. Patient denies fever, headache, diarrha, chest pain, SOB, dysuria, constipation.    Patient is a 48 y.o. male presenting with abdominal pain.  Abdominal Pain Associated symptoms: nausea and vomiting   Associated symptoms: no chest pain, no chills, no diarrhea, no dysuria, no fatigue, no fever and no shortness of breath     Past Medical History  Diagnosis Date  . Hypertension   . ESRD (end stage renal disease)     due to HTN per patient, followed at [facility], s/p failed kidney transplant  . Depression   . Complication of anesthesia     itching, sore throat  . Diabetes mellitus without complication     No history per patient, but remains under history as A1c would not be accurate given on dialysis   Past Surgical History  Procedure Laterality Date  . Kidney receipient  2006    failed and started HD in March 2014  . Capd insertion    . Capd removal     No family history on file. History  Substance Use Topics  . Smoking status: Former Smoker -- 1.00 packs/day for 1 years    Types: Cigarettes  .  Smokeless tobacco: Never Used     Comment: quit Jan 2014  . Alcohol Use: No    Review of Systems  Constitutional: Negative for fever, chills and fatigue.  HENT: Negative for trouble swallowing.   Eyes: Negative for visual disturbance.  Respiratory: Negative for shortness of breath.   Cardiovascular: Negative for chest pain and palpitations.  Gastrointestinal: Positive for nausea, vomiting and abdominal pain. Negative for diarrhea.  Genitourinary: Negative for dysuria and difficulty urinating.  Musculoskeletal: Negative for arthralgias and neck pain.  Skin: Negative for color change.  Neurological: Negative for dizziness and weakness.  Psychiatric/Behavioral: Negative for dysphoric mood.      Allergies  Ferrlecit and Darvocet  Home Medications   Current Outpatient Rx  Name  Route  Sig  Dispense  Refill  . allopurinol (ZYLOPRIM) 100 MG tablet   Oral   Take 1 tablet (100 mg total) by mouth daily.   30 tablet   6   . amLODipine (NORVASC) 5 MG tablet   Oral   Take 10 mg by mouth daily.         . benzonatate (TESSALON) 100 MG capsule   Oral   Take 2 capsules (200 mg total) by mouth 2 (two) times daily as needed for cough.   20 capsule   0   . cinacalcet (SENSIPAR) 30 MG tablet   Oral   Take 30 mg by mouth daily.         . cloNIDine (CATAPRES) 0.3 MG tablet  Oral   Take 1 tablet (0.3 mg total) by mouth 3 (three) times daily.   90 tablet   0   . colchicine 0.6 MG tablet   Oral   Take 0.6 mg by mouth daily as needed (for gout).         . furosemide (LASIX) 80 MG tablet   Oral   Take 80 mg by mouth daily.         [name] HYDROcodone-acetaminophen (NORCO/VICODIN) 5-325 MG per tablet   Oral   Take 1 tablet by mouth every 4 (four) hours as needed.   20 tablet   0   . labetalol (NORMODYNE) 200 MG tablet   Oral   Take 200 mg by mouth 3 (three) times daily.         [name] lisinopril (PRINIVIL,ZESTRIL) 10 MG tablet   Oral   Take 10 mg by mouth daily.          . minoxidil (LONITEN) 2.5 MG tablet   Oral   Take 2.5 mg by mouth daily.          [name] omeprazole (PRILOSEC) 20 MG capsule   Oral   Take 20 mg by mouth daily.         [name] oxyCODONE-acetaminophen (PERCOCET) 10-325 MG per tablet   Oral   Take 1 tablet by mouth every 4 (four) hours as needed for pain.         . predniSONE (DELTASONE) 5 MG tablet   Oral   Take 15 mg by mouth daily with breakfast.         . sevelamer carbonate (RENVELA) 800 MG tablet   Oral   Take 800 mg by mouth 4 (four) times daily - after meals and at bedtime.         . sulfamethoxazole-trimethoprim (BACTRIM,SEPTRA) 400-80 MG per tablet   Oral   Take 1 tablet by mouth every Monday, Wednesday, and Friday.         . tacrolimus (PROGRAF) 5 MG capsule   Oral   Take 10 mg by mouth 2 (two) times daily.          . valGANciclovir (VALCYTE) 450 MG tablet   Oral   Take 450 mg by mouth every Monday, Wednesday, and Friday.          BP 192/127  Pulse 77  Temp(Src) 97.8 F (36.6 C) (Oral)  Resp 18  SpO2 98% Physical Exam  Nursing note and vitals reviewed. Constitutional: He is oriented to person, place, and time. He appears well-developed and well-nourished. No distress.  HENT:  Head: Normocephalic and atraumatic.  Eyes: Conjunctivae and EOM are normal. No scleral icterus.  Neck: Normal range of motion.  Cardiovascular: Normal rate and regular rhythm.  Exam reveals no gallop and no friction rub.   No murmur heard. Pulmonary/Chest: Effort normal and breath sounds normal. He has no wheezes. He has no rales. He exhibits no tenderness.  Abdominal: Soft. He exhibits no distension. There is tenderness. There is no rebound and no guarding.  Mild epigastric tenderness to palpation. No peritoneal signs or other focal tenderness to palpation.   Musculoskeletal: Normal range of motion.  Neurological: He is alert and oriented to person, place, and time. Coordination normal.  Speech is goal-oriented. Moves limbs  without ataxia.   Skin: Skin is warm and dry.  Psychiatric: He has a normal mood and affect. His behavior is normal.    ED Course  Procedures (including critical care time) Labs Review Labs  Reviewed - No data to display Imaging Review Dg Chest 2 View  10/30/2013   CLINICAL DATA:  Cough, chest pain, headache.  EXAM: CHEST  2 VIEW  COMPARISON:  10/17/2013  FINDINGS: Unchanged appearance of right central venous catheter. The heart size and mediastinal contours are within normal limits. Both lungs are clear. The visualized skeletal structures are unremarkable.  IMPRESSION: No active cardiopulmonary disease.   Electronically Signed   By: Lucienne Capers M.D.   On: 10/30/2013 03:17     EKG Interpretation None      MDM   Final diagnoses:  Abdominal pain    7:36 AM Patient has no new symptoms. I will not repeat his labs. Vitals stable and patient afebrile. Patient was discharged from here about 3 hours ago. Patient will have IM phenergan and be discharged. I spoke with [facility] who states the patient can still be dialyzed if he goes there.   8:22 AM Patient states he can drive himself. Patient will have ODT zofran and be discharged. Patient advised to go directly to dialysis.    Alvina Chou, [place] 10/30/13 [phone]

## 2013-12-05 ENCOUNTER — Other Ambulatory Visit: Payer: Self-pay

## 2013-12-05 ENCOUNTER — Emergency Department (HOSPITAL_COMMUNITY)
Admission: EM | Admit: 2013-12-05 | Discharge: 2013-12-05 | Disposition: A | Discharge status: Home / Self Care | Payer: Medicare Other | Attending: Emergency Medicine | Admitting: Emergency Medicine

## 2013-12-05 ENCOUNTER — Emergency Department (HOSPITAL_COMMUNITY): Payer: Medicare Other

## 2013-12-05 ENCOUNTER — Encounter (HOSPITAL_COMMUNITY): Payer: Self-pay | Admitting: Emergency Medicine

## 2013-12-05 ENCOUNTER — Emergency Department (HOSPITAL_COMMUNITY)
Admission: EM | Admit: 2013-12-05 | Discharge: 2013-12-05 | Disposition: A | Discharge status: Home / Self Care | Payer: Medicare Other | Source: Home / Self Care | Attending: Emergency Medicine | Admitting: Emergency Medicine

## 2013-12-05 DIAGNOSIS — Z87891 Personal history of nicotine dependence: Secondary | ICD-10-CM

## 2013-12-05 DIAGNOSIS — F3289 Other specified depressive episodes: Secondary | ICD-10-CM | POA: Insufficient documentation

## 2013-12-05 DIAGNOSIS — Z992 Dependence on renal dialysis: Secondary | ICD-10-CM | POA: Insufficient documentation

## 2013-12-05 DIAGNOSIS — Z79899 Other long term (current) drug therapy: Secondary | ICD-10-CM | POA: Insufficient documentation

## 2013-12-05 DIAGNOSIS — I10 Essential (primary) hypertension: Secondary | ICD-10-CM

## 2013-12-05 DIAGNOSIS — N189 Chronic kidney disease, unspecified: Secondary | ICD-10-CM

## 2013-12-05 DIAGNOSIS — I120 Hypertensive chronic kidney disease with stage 5 chronic kidney disease or end stage renal disease: Secondary | ICD-10-CM

## 2013-12-05 DIAGNOSIS — E119 Type 2 diabetes mellitus without complications: Secondary | ICD-10-CM | POA: Insufficient documentation

## 2013-12-05 DIAGNOSIS — Z940 Kidney transplant status: Secondary | ICD-10-CM | POA: Insufficient documentation

## 2013-12-05 DIAGNOSIS — Z8659 Personal history of other mental and behavioral disorders: Secondary | ICD-10-CM

## 2013-12-05 DIAGNOSIS — R0602 Shortness of breath: Secondary | ICD-10-CM | POA: Insufficient documentation

## 2013-12-05 DIAGNOSIS — E875 Hyperkalemia: Secondary | ICD-10-CM

## 2013-12-05 DIAGNOSIS — N186 End stage renal disease: Secondary | ICD-10-CM | POA: Insufficient documentation

## 2013-12-05 DIAGNOSIS — F329 Major depressive disorder, single episode, unspecified: Secondary | ICD-10-CM | POA: Insufficient documentation

## 2013-12-05 DIAGNOSIS — Z9889 Other specified postprocedural states: Secondary | ICD-10-CM | POA: Insufficient documentation

## 2013-12-05 DIAGNOSIS — IMO0002 Reserved for concepts with insufficient information to code with codable children: Secondary | ICD-10-CM

## 2013-12-05 LAB — COMPREHENSIVE METABOLIC PANEL
ALBUMIN: 3.1 g/dL — AB (ref 3.5–5.2)
ALK PHOS: 74 U/L (ref 39–117)
ALT: 22 U/L (ref 0–53)
AST: 20 U/L (ref 0–37)
BILIRUBIN TOTAL: 0.5 mg/dL (ref 0.3–1.2)
BUN: 76 mg/dL — ABNORMAL HIGH (ref 6–23)
CHLORIDE: 101 meq/L (ref 96–112)
CO2: 18 mEq/L — ABNORMAL LOW (ref 19–32)
Calcium: 9.1 mg/dL (ref 8.4–10.5)
Creatinine, Ser: 16.5 mg/dL — ABNORMAL HIGH (ref 0.50–1.35)
GFR calc non Af Amer: 3 mL/min — ABNORMAL LOW (ref >90)
GFR, EST AFRICAN AMERICAN: 3 mL/min — AB (ref >90)
GLUCOSE: 71 mg/dL (ref 70–99)
POTASSIUM: 5.8 meq/L — AB (ref 3.7–5.3)
Sodium: 136 mEq/L — ABNORMAL LOW (ref 137–147)
Total Protein: 7.4 g/dL (ref 6.0–8.3)

## 2013-12-05 LAB — I-STAT CHEM 8, ED
BUN: 83 mg/dL — ABNORMAL HIGH (ref 6–23)
CALCIUM ION: 1.14 mmol/L (ref 1.12–1.23)
CHLORIDE: 106 meq/L (ref 96–112)
Creatinine, Ser: 17.4 mg/dL — ABNORMAL HIGH (ref 0.50–1.35)
GLUCOSE: 107 mg/dL — AB (ref 70–99)
HEMATOCRIT: 35 % — AB (ref 39.0–52.0)
Hemoglobin: 11.9 g/dL — ABNORMAL LOW (ref 13.0–17.0)
Potassium: 6.2 mEq/L — ABNORMAL HIGH (ref 3.7–5.3)
Sodium: 136 mEq/L — ABNORMAL LOW (ref 137–147)
TCO2: 18 mmol/L (ref 0–100)

## 2013-12-05 LAB — CBC WITH DIFFERENTIAL/PLATELET
Basophils Absolute: 0 10*3/uL (ref 0.0–0.1)
Basophils Relative: 0 % (ref 0–1)
Eosinophils Absolute: 0.3 10*3/uL (ref 0.0–0.7)
Eosinophils Relative: 4 % (ref 0–5)
HCT: 26.9 % — ABNORMAL LOW (ref 39.0–52.0)
HEMOGLOBIN: 8.7 g/dL — AB (ref 13.0–17.0)
LYMPHS ABS: 2.2 10*3/uL (ref 0.7–4.0)
LYMPHS PCT: 37 % (ref 12–46)
MCH: 26.5 pg (ref 26.0–34.0)
MCHC: 32.3 g/dL (ref 30.0–36.0)
MCV: 82 fL (ref 78.0–100.0)
Monocytes Absolute: 0.7 10*3/uL (ref 0.1–1.0)
Monocytes Relative: 11 % (ref 3–12)
NEUTROS PCT: 48 % (ref 43–77)
Neutro Abs: 2.9 10*3/uL (ref 1.7–7.7)
Platelets: 194 10*3/uL (ref 150–400)
RBC: 3.28 MIL/uL — AB (ref 4.22–5.81)
RDW: 19.4 % — ABNORMAL HIGH (ref 11.5–15.5)
WBC: 6 10*3/uL (ref 4.0–10.5)

## 2013-12-05 LAB — I-STAT TROPONIN, ED: Troponin i, poc: 0.05 ng/mL (ref 0.00–0.08)

## 2013-12-05 MED ORDER — LISINOPRIL 10 MG PO TABS
10.0000 mg | ORAL_TABLET | Freq: Every day | ORAL | Status: DC
  Administered 2013-12-05: 10 mg via ORAL
  Filled 2013-12-05: qty 1

## 2013-12-05 MED ORDER — ONDANSETRON 8 MG PO TBDP: 8.0000 mg | ORAL_TABLET | Freq: Three times a day (TID) | ORAL | Status: DC | PRN

## 2013-12-05 MED ORDER — SODIUM POLYSTYRENE SULFONATE 15 GM/60ML PO SUSP
30.0000 g | Freq: Once | ORAL | Status: AC
  Administered 2013-12-05: 30 g via ORAL
  Filled 2013-12-05: qty 120

## 2013-12-05 MED ORDER — ONDANSETRON 4 MG PO TBDP
8.0000 mg | ORAL_TABLET | Freq: Once | ORAL | Status: AC
  Administered 2013-12-05: 8 mg via ORAL
  Filled 2013-12-05: qty 2

## 2013-12-05 MED ORDER — AMLODIPINE BESYLATE 10 MG PO TABS
10.0000 mg | ORAL_TABLET | Freq: Every day | ORAL | Status: DC
Start: 2013-12-05 — End: 2013-12-05
  Administered 2013-12-05: 10 mg via ORAL
  Filled 2013-12-05: qty 1

## 2013-12-05 MED ORDER — CLONIDINE HCL 0.1 MG PO TABS
0.3000 mg | ORAL_TABLET | Freq: Three times a day (TID) | ORAL | Status: DC
  Administered 2013-12-05: 0.3 mg via ORAL
  Filled 2013-12-05 (×2): qty 1

## 2013-12-05 MED ORDER — LABETALOL HCL 200 MG PO TABS
200.0000 mg | ORAL_TABLET | Freq: Three times a day (TID) | ORAL | Status: DC
  Administered 2013-12-05: 200 mg via ORAL
  Filled 2013-12-05 (×3): qty 1

## 2013-12-05 NOTE — ED Notes (Signed)
The pt is sleeping soundly.   No response to name called

## 2013-12-05 NOTE — ED Notes (Signed)
Pt. Refusing fecal occult card exam.

## 2013-12-05 NOTE — Discharge Instructions (Signed)
Hemodialysis Dialysis is a procedure that replaces some of the work that healthy kidneys do. It is done when you lose about 85 90% of your kidney function and sometimes earlier if your symptoms may be improved by dialysis. During dialysis, wastes, salt, and extra water are removed from the blood; and the level of certain chemicals in the blood (such as potassium) is maintained. Hemodialysis is a type of dialysis in which a machine called a dialyzer is used to filter the blood. Hemodialysis is usually performed by a caregiver at a hospital or dialysis center 3 times a week for 3 5 hours during each visit. It may also be performed more frequently and for longer periods of time at home with training. LET YOUR CAREGIVER KNOW ABOUT:  Any allergies you have.  All medications you are taking, including vitamins, herbs, eyedrops, and over-the-counter medications and creams.  Any blood disorders you have had.  Other health problems you have. RISKS AND COMPLICATIONS Generally, hemodialysis is a safe procedure. However, as with any procedure, complications can occur. Possible complications include:  Low blood pressure (hypotension).  Narrowing or ballooning of a blood vessel or bleeding at the site where blood is removed from the body and returned to the body (vascular access).  An infection or blockage at the vascular access site. BEFORE THE PROCEDURE Before having hemodialysis for the first time, you will need surgery to create a site where blood can be removed from the body and returned to the body (vascular access). There are three types of vascular accesses:  Arteriovenous fistula. This type of access is created when an artery and a vein (usually in the arm) are connected during surgery. The arteriovenous fistula takes 1 6 months to develop after surgery.  Arteriovenous graft. This type of access is created when an artery and a vein in the arm are connected during surgery with a tube. An  arteriovenous graft can be used within 2 3 weeks of surgery.  A venous catheter. To create this type of access, a thin, flexible tube (catheter) is placed in a large vein in your neck, chest, or groin. A venous catheter can be used right away. PROCEDURE  Hemodialysis is performed while you are sitting or reclining. During the procedure, you may sleep, read, watch television, or perform any other tasks that can be done while you are in this position. If you experience side effects or any other discomfort during the procedure, let your caregiver know. He or she may be able to make adjustments to help you feel more comfortable. Your hemodialysis process may look something like this: 1. Your weight, blood pressure, pulse, and temperature will be measured. 2. The skin around your vascular access will be sterilized. 3. Your vascular access will be connected to the dialyzer. If your access is a fistula or graft, two needles will be inserted through the vascular access. The needles will be connected to a plastic tube that is connected to the dialyzer. They will be taped to your skin so that they do not move out of place. If your access is a catheter, it will be connected to a plastic tube that is connected to the dialyzer. 4. The dialysis machine will be turned on. This will cause your blood to flow to the dialyzer. The dialyzer will filter and clean your blood before returning it to your body. While the dialysis machine is working, your blood pressure and pulse will be checked several times. 5. Once dialysis is complete, your vascular  access will be disconnected from the dialyzer. If your access is a fistula or graft, the needles will be removed and a bandage (dressing) will be placed over the access. AFTER THE PROCEDURE  Your weight will be measured.  Your blood may be tested to see whether your treatments are removing enough wastes. This is usually done once a month.  You may experience or continue to  experience side effects, including:  Dizziness.  Muscle cramps.  Nausea.  Headaches.  Feeling tired. Energy levels usually return to normal the day after the procedure.  Itchiness. Your caregiver may be able to prescribe medication to relieve it.  Achy or jittery legs. You may feel like kicking your legs. This sometimes causes sleeping problems.  Allergic reaction to the chemicals used to sterilize the dialyzer. Document Released: 11/02/2012 Document Reviewed: 11/02/2012 [facility] Patient Information 2014 [place], [place].

## 2013-12-05 NOTE — ED Notes (Signed)
Pt. Dialysis patient. Missed dialysis Thursday and Saturday. Reports HA, SOB, Abdominal pain, and N/V starting this AM.

## 2013-12-05 NOTE — ED Notes (Signed)
Pt. was just discharged home  this morning after being seen/ examined / blood tests done and received Kayexalate at ER , vomitted x3 while sitting at waiting area.

## 2013-12-05 NOTE — ED Notes (Addendum)
Vomiting since 12/04/13 in the am. Mild diarrhea.epigastric pain.

## 2013-12-05 NOTE — ED Notes (Signed)
Pt walked out angry without his discharge papers

## 2013-12-05 NOTE — ED Notes (Signed)
Dr. Montez Morita at bedside

## 2013-12-05 NOTE — Discharge Instructions (Signed)
Hyperkalemia Hyperkalemia means you have too much potassium in your blood. Potassium is a type of salt in the blood (electrolyte). Normally, your kidneys remove potassium from the body. Too much potassium can be life-threatening. HOME CARE  Only take medicine as told by your doctor.  Do not take vitamins or natural products unless your doctor says they are okay.  Keep all doctor visits as told.  Follow diet instructions as told by your doctor. GET HELP RIGHT AWAY IF:  Your heartbeat is not regular or very slow.  You feel dizzy (lightheaded).  You feel weak.  You are short of breath.  You have chest pain.  You pass out (faint). MAKE SURE YOU:   Understand these instructions.  Will watch your condition.  Will get help right away if you are not doing well or get worse. Document Released: 07/08/2005 Document Revised: 09/30/2011 Document Reviewed: 12/26/2010 [facility] Patient Information 2014 [place], [place].  Chronic Kidney Disease Chronic kidney disease occurs when the kidneys are damaged over a long period. The kidneys are two organs that lie on either side of the spine between the middle of the back and the front of the abdomen. The kidneys:   Remove wastes and extra water from the blood.   Produce important hormones. These help keep bones strong, regulate blood pressure, and help create red blood cells.   Balance the fluids and chemicals in the blood and tissues. A small amount of kidney damage may not cause problems, but a large amount of damage may make it difficult or impossible for the kidneys to work the way they should. If steps are not taken to slow down the kidney damage or stop it from getting worse, the kidneys may stop working permanently. Most of the time, chronic kidney disease does not go away. However, it can often be controlled, and those with the disease can usually live normal lives. CAUSES  The most common causes of chronic kidney disease are diabetes  and high blood pressure (hypertension). Chronic kidney disease may also be caused by:   Diseases that cause kidneys' filters to become inflamed.   Diseases that affect the immune system.   Genetic diseases.   Medicines that damage the kidneys, such as anti-inflammatory medicines.  Poisoning or exposure to toxic substances.   A reoccurring kidney or urinary infection.   A problem with urine flow. This may be caused by:   Cancer.   Kidney stones.   An enlarged prostate in males. SYMPTOMS  Because the kidney damage in chronic kidney disease occurs slowly, symptoms develop slowly and may not be obvious until the kidney damage becomes severe. A person may have a kidney disease for years without showing any symptoms. Symptoms can include:   Swelling (edema) of the legs, ankles, or feet.   Tiredness (lethargy).   Nausea or vomiting.   Confusion.   Problems with urination, such as:   Decreased urine production.   Frequent urination, especially at night.   Frequent accidents in children who are potty trained.   Muscle twitches and cramps.   Shortness of breath.  Weakness.   Persistent itchiness.   Loss of appetite.  Metallic taste in the mouth.  Trouble sleeping.  Slowed development in children.  Short stature in children. DIAGNOSIS  Chronic kidney disease may be detected and diagnosed by tests, including blood, urine, imaging, or kidney biopsy tests.  TREATMENT  Most chronic kidney diseases cannot be cured. Treatment usually involves relieving symptoms and preventing or slowing the progression  of the disease. Treatment may include:   A special diet. You may need to avoid alcohol and foods thatare salty and high in potassium.   Medicines. These may:   Lower blood pressure.   Relieve anemia.   Relieve swelling.   Protect the bones. HOME CARE INSTRUCTIONS   Follow your prescribed diet.   Only take over-the-counter or  prescription medicines as directed by your caregiver.  Do not take any new medicines (prescription, over-the-counter, or nutritional supplements) unless approved by your caregiver. Many medicines can worsen your kidney damage or need to have the dose adjusted.   Quit smoking if you are a smoker. Talk to your caregiver about a smoking cessation program.   Keep all follow-up appointments as directed by your caregiver. SEEK IMMEDIATE MEDICAL CARE IF:  Your symptoms get worse or you develop new symptoms.   You develop symptoms of end-stage kidney disease. These include:   Headaches.   Abnormally dark or light skin.   Numbness in the hands or feet.   Easy bruising.   Frequent hiccups.   Menstruation stops.   You have a fever.   You have decreased urine production.   You havepain or bleeding when urinating. MAKE SURE YOU:  Understand these instructions.  Will watch your condition.  Will get help right away if you are not doing well or get worse. FOR MORE INFORMATION  American Association of Kidney Patients: BombTimer.gl National Kidney Foundation: www.kidney.[place]: https://mathis.com/ [facility]: www.lifeoptions.org and www.kidneyschool.org Document Released: 04/16/2008 Document Revised: 06/24/2012 Document Reviewed: 03/06/2012 [facility] Patient Information 2014 [place], [place].

## 2013-12-05 NOTE — ED Provider Notes (Signed)
CSN: [id number]     Arrival date & time 12/05/13  0047 History   First MD Initiated Contact with Patient 12/05/13 0205     Chief Complaint  Patient presents with  . Emesis     (Consider location/radiation/quality/duration/timing/severity/associated sxs/prior Treatment) HPI History per patient. End stage renal disease gets dialysis in [place] at [facility]. Patient typically goes Tuesday, Thursday and Saturday. He missed his last 2 dialysis sessions. Now complaining of difficulty breathing especially with laying down flat. No fevers or chills. No chest pain. No extremity swelling. Symptoms moderate in severity. Past Medical History  Diagnosis Date  . Hypertension   . ESRD (end stage renal disease)     due to HTN per patient, followed at [facility], s/p failed kidney transplant  . Depression   . Complication of anesthesia     itching, sore throat  . Diabetes mellitus without complication     No history per patient, but remains under history as A1c would not be accurate given on dialysis   Past Surgical History  Procedure Laterality Date  . Kidney receipient  2006    failed and started HD in March 2014  . Capd insertion    . Capd removal     History reviewed. No pertinent family history. History  Substance Use Topics  . Smoking status: Former Smoker -- 1.00 packs/day for 1 years    Types: Cigarettes  . Smokeless tobacco: Never Used     Comment: quit Jan 2014  . Alcohol Use: No    Review of Systems  Constitutional: Negative for fever and chills.  Eyes: Negative for visual disturbance.  Respiratory: Positive for shortness of breath. Negative for cough.   Cardiovascular: Negative for chest pain.  Gastrointestinal: Negative for abdominal pain.  Genitourinary: Negative for flank pain.  Musculoskeletal: Negative for back pain.  Skin: Negative for rash.  Neurological: Negative for weakness and headaches.  All other systems reviewed and are negative.     Allergies   Ferrlecit and Darvocet  Home Medications   Prior to Admission medications   Medication Sig Start Date End Date Taking? Authorizing Provider  allopurinol (ZYLOPRIM) 100 MG tablet Take 1 tablet (100 mg total) by mouth daily. 04/21/13  Yes [name], MD  amLODipine (NORVASC) 5 MG tablet Take 10 mg by mouth daily.   Yes Historical Provider, MD  cinacalcet (SENSIPAR) 30 MG tablet Take 30 mg by mouth daily.   Yes Historical Provider, MD  cloNIDine (CATAPRES) 0.3 MG tablet Take 1 tablet (0.3 mg total) by mouth 3 (three) times daily. 10/18/13  Yes [name], MD  colchicine 0.6 MG tablet Take 0.6 mg by mouth daily as needed (for gout). 04/13/13  Yes [name], MD  furosemide (LASIX) 80 MG tablet Take 80 mg by mouth daily.   Yes Historical Provider, MD  labetalol (NORMODYNE) 200 MG tablet Take 200 mg by mouth 3 (three) times daily.   Yes Historical Provider, MD  lisinopril (PRINIVIL,ZESTRIL) 10 MG tablet Take 10 mg by mouth daily. 05/11/13  Yes Historical Provider, MD  minoxidil (LONITEN) 2.5 MG tablet Take 2.5 mg by mouth daily.  10/12/13  Yes Historical Provider, MD  omeprazole (PRILOSEC) 20 MG capsule Take 20 mg by mouth daily. 07/01/13  Yes Historical Provider, MD  predniSONE (DELTASONE) 5 MG tablet Take 15 mg by mouth daily with breakfast.   Yes Historical Provider, MD  sulfamethoxazole-trimethoprim (BACTRIM,SEPTRA) 400-80 MG per tablet Take 1 tablet by mouth every Monday, Wednesday, and Friday.  07/01/13  Yes Historical Provider, MD  tacrolimus (PROGRAF) 5 MG capsule Take 10 mg by mouth 2 (two) times daily.    Yes Historical Provider, MD  valGANciclovir (VALCYTE) 450 MG tablet Take 450 mg by mouth every Monday, Wednesday, and Friday.   Yes Historical Provider, MD   BP 186/130  Pulse 73  Temp(Src) 97.7 F (36.5 C) (Oral)  Resp 16  Ht _0  (1.854 m)  Wt 165 lb (74.844 kg)  BMI 21.77 kg/m2  SpO2 98% Physical Exam  Constitutional: He is oriented to person, place, and time. He  appears well-developed and well-nourished.  HENT:  Head: Normocephalic and atraumatic.  Eyes: EOM are normal. Pupils are equal, round, and reactive to light.  Neck: Neck supple.  Cardiovascular: Normal rate, regular rhythm and intact distal pulses.   Pulmonary/Chest: Effort normal and breath sounds normal. No respiratory distress.  Abdominal: Soft. There is no tenderness.  Musculoskeletal: Normal range of motion. He exhibits no edema and no tenderness.  Neurological: He is alert and oriented to person, place, and time.  Skin: Skin is warm and dry.    ED Course  Procedures (including critical care time) Labs Review Labs Reviewed  CBC WITH DIFFERENTIAL - Abnormal; Notable for the following:    RBC 3.28 (*)    Hemoglobin 8.7 (*)    HCT 26.9 (*)    RDW 19.4 (*)    All other components within normal limits  COMPREHENSIVE METABOLIC PANEL - Abnormal; Notable for the following:    Sodium 136 (*)    Potassium 5.8 (*)    CO2 18 (*)    BUN 76 (*)    Creatinine, Ser 16.50 (*)    Albumin 3.1 (*)    GFR calc non Af Amer 3 (*)    GFR calc Af Amer 3 (*)    All other components within normal limits  OCCULT BLOOD X 1 CARD TO LAB, STOOL  I-STAT TROPOININ, ED  TYPE AND SCREEN    Imaging Review Dg Chest 2 View  12/05/2013   CLINICAL DATA:  Vomiting and shortness of breath.  EXAM: CHEST  2 VIEW  COMPARISON:  10/30/2013.  FINDINGS: Stable enlarged cardiac silhouette and right jugular catheter. No significant change in diffuse peribronchial thickening and accentuation of the interstitial markings. Unremarkable bones.  IMPRESSION: Stable cardiomegaly, chronic bronchitic changes and chronic interstitial lung disease.   Electronically Signed   By: Enrique Sack M.D.   On: 12/05/2013 03:39    Date: 12/05/2013  Rate: 74  Rhythm: normal sinus rhythm  QRS Axis: left  Intervals: normal and QT prolonged  ST/T Wave abnormalities: nonspecific ST/T changes  Conduction Disutrbances:none  Narrative  Interpretation:   Old EKG Reviewed: none available  Room air pulse ox 100% is adequate  4:58 AM d/w Renal on call Dr Mercy Moore, as above - he knows PT well and recommends that he call [facility] in the am and arrange for dialysis on Monday morning. OK to discharge with Kayexalate now. He does not feel he requires admit for emergency dialysis.   PT agrees to discharge and follow up plan MDM   Dx: ESRD, hyperkalemia  ECG, CXR, labs Renal consult Vital signs and nurses notes reviewed and considered   Teressa Lower, MD 12/05/13 [phone]

## 2013-12-05 NOTE — ED Notes (Signed)
Pt up to the br then came out stating that he was vomiting.  i told him that the edp had discharged him.  He became angry stating that he was sick and wasn't the doctor  Give him med.  i spoke to dr Marnette Burgess who stated no meds the pt needed to go.  Pt still angry  Not making any attempt to get dressed

## 2013-12-05 NOTE — ED Provider Notes (Signed)
CSN: [id number]     Arrival date & time 12/05/13  5621 History   First MD Initiated Contact with Patient 12/05/13 [phone]     Chief Complaint  Patient presents with  . Emesis   HPI The patient presents to emergency room with complaints of nausea and vomiting. The patient was initially seen in the emergency department earlier this morning. At that time he had been complaining of shortness of breath associated with missing his last 2 dialysis sessions. Patient had not mentioned issues with nausea and vomiting. Dr. Marnette Burgess spoke with nephrology after reviewing his laboratory tests and x-rays. Dr. Mercy Moore, nephrology, felt the patient could wait until Monday for dialysis.  He was familiar with the patient and the patient fortunately has a history of noncompliance. The patient was given a RX for Kayexalate.  Patient denies trouble with diarrhea. He is having some abdominal cramping. He states he's had nausea and vomiting bringing up yellow emesis. Patient was able to eat this morning after his ER evaluation. Is also asking for something to eat and drink right now. Past Medical History  Diagnosis Date  . Hypertension   . ESRD (end stage renal disease)     due to HTN per patient, followed at [facility], s/p failed kidney transplant  . Depression   . Complication of anesthesia     itching, sore throat  . Diabetes mellitus without complication     No history per patient, but remains under history as A1c would not be accurate given on dialysis   Past Surgical History  Procedure Laterality Date  . Kidney receipient  2006    failed and started HD in March 2014  . Capd insertion    . Capd removal     No family history on file. History  Substance Use Topics  . Smoking status: Former Smoker -- 1.00 packs/day for 1 years    Types: Cigarettes  . Smokeless tobacco: Never Used     Comment: quit Jan 2014  . Alcohol Use: No    Review of Systems  All other systems reviewed and are  negative.     Allergies  Ferrlecit and Darvocet  Home Medications   Prior to Admission medications   Medication Sig Start Date End Date Taking? Authorizing Provider  allopurinol (ZYLOPRIM) 100 MG tablet Take 1 tablet (100 mg total) by mouth daily. 04/21/13   [name], MD  amLODipine (NORVASC) 5 MG tablet Take 10 mg by mouth daily.    Historical Provider, MD  cinacalcet (SENSIPAR) 30 MG tablet Take 30 mg by mouth daily.    Historical Provider, MD  cloNIDine (CATAPRES) 0.3 MG tablet Take 1 tablet (0.3 mg total) by mouth 3 (three) times daily. 10/18/13   [name], MD  colchicine 0.6 MG tablet Take 0.6 mg by mouth daily as needed (for gout). 04/13/13   [name], MD  furosemide (LASIX) 80 MG tablet Take 80 mg by mouth daily.    Historical Provider, MD  labetalol (NORMODYNE) 200 MG tablet Take 200 mg by mouth 3 (three) times daily.    Historical Provider, MD  lisinopril (PRINIVIL,ZESTRIL) 10 MG tablet Take 10 mg by mouth daily. 05/11/13   Historical Provider, MD  minoxidil (LONITEN) 2.5 MG tablet Take 2.5 mg by mouth daily.  10/12/13   Historical Provider, MD  omeprazole (PRILOSEC) 20 MG capsule Take 20 mg by mouth daily. 07/01/13   Historical Provider, MD  predniSONE (DELTASONE) 5 MG tablet Take 15 mg by mouth  daily with breakfast.    Historical Provider, MD  sulfamethoxazole-trimethoprim (BACTRIM,SEPTRA) 400-80 MG per tablet Take 1 tablet by mouth every Monday, Wednesday, and Friday. 07/01/13   Historical Provider, MD  tacrolimus (PROGRAF) 5 MG capsule Take 10 mg by mouth 2 (two) times daily.     Historical Provider, MD  valGANciclovir (VALCYTE) 450 MG tablet Take 450 mg by mouth every Monday, Wednesday, and Friday.    Historical Provider, MD   BP 183/115  Pulse 67  Temp(Src) 97.4 F (36.3 C) (Oral)  Resp 13  SpO2 98% Physical Exam  Nursing note and vitals reviewed. Constitutional: He appears well-developed and well-nourished. No distress.  HENT:  Head: Normocephalic  and atraumatic.  Right Ear: External ear normal.  Left Ear: External ear normal.  Eyes: Conjunctivae are normal. Right eye exhibits no discharge. Left eye exhibits no discharge. No scleral icterus.  Neck: Neck supple. No tracheal deviation present.  Cardiovascular: Normal rate, regular rhythm and intact distal pulses.   Pulmonary/Chest: Effort normal and breath sounds normal. No stridor. No respiratory distress. He has no wheezes. He has no rales.  Abdominal: Soft. Bowel sounds are normal. He exhibits no distension. There is no rebound and no guarding.  Musculoskeletal: He exhibits no edema and no tenderness.  Neurological: He is alert. He has normal strength. No cranial nerve deficit (no facial droop, extraocular movements intact, no slurred speech) or sensory deficit. He exhibits normal muscle tone. He displays no seizure activity. Coordination normal.  Skin: Skin is warm and dry. No rash noted.  Psychiatric: He has a normal mood and affect.    ED Course  Procedures (including critical care time) Labs Review Labs Reviewed  I-STAT CHEM 8, ED - Abnormal; Notable for the following:    Sodium 136 (*)    Potassium 6.2 (*)    BUN 83 (*)    Creatinine, Ser 17.40 (*)    Glucose, Bld 107 (*)    Hemoglobin 11.9 (*)    HCT 35.0 (*)    All other components within normal limits    Imaging Review Dg Chest 2 View  12/05/2013   CLINICAL DATA:  Vomiting and shortness of breath.  EXAM: CHEST  2 VIEW  COMPARISON:  10/30/2013.  FINDINGS: Stable enlarged cardiac silhouette and right jugular catheter. No significant change in diffuse peribronchial thickening and accentuation of the interstitial markings. Unremarkable bones.  IMPRESSION: Stable cardiomegaly, chronic bronchitic changes and chronic interstitial lung disease.   Electronically Signed   By: Enrique Sack M.D.   On: 12/05/2013 03:39   Dg Abd 1 View  12/05/2013   CLINICAL DATA:  Vomiting for 3 days  EXAM: ABDOMEN - 1 VIEW  COMPARISON:  None.   FINDINGS: There is moderate stool in the colon. The bowel gas pattern is unremarkable. No obstruction or free air is seen on this supine examination. There are no abnormal calcifications.  IMPRESSION: Overall bowel gas pattern unremarkable.  Moderate stool in colon.   Electronically Signed   By: Lowella Grip M.D.   On: 12/05/2013 08:11    MDM   Final diagnoses:  Chronic renal failure  Hyperkalemia    Pt was given his morning BP medications.  He is chronically hypertensive and non compliant.  CXR earlier this am without pulm edema.  Pt was given a meal this am.  He tolerated without difficulty.  Feels better.  Discussed with Dr Mercy Moore.   Will give dose of kayexalate.  No indication for emergent dialysis at this time.  Follow up with dialysis center tomorrow.    Kathalene Frames, MD 12/05/13 [phone]

## 2013-12-05 NOTE — ED Notes (Signed)
Pt. Placed on 2L O2 for patient comfort.

## 2013-12-05 NOTE — ED Notes (Signed)
Hillard Danker, MD notified of abnormal lab test results

## 2013-12-12 ENCOUNTER — Encounter (HOSPITAL_COMMUNITY): Payer: Self-pay | Admitting: Emergency Medicine

## 2013-12-12 ENCOUNTER — Inpatient Hospital Stay (HOSPITAL_COMMUNITY)
Admission: EM | Admit: 2013-12-12 | Discharge: 2013-12-13 | DRG: 640 | Disposition: A | Discharge status: Home / Self Care | Payer: Medicare Other | Attending: Internal Medicine | Admitting: Internal Medicine

## 2013-12-12 ENCOUNTER — Emergency Department (HOSPITAL_COMMUNITY)
Admission: EM | Admit: 2013-12-12 | Discharge: 2013-12-12 | Disposition: A | Discharge status: Home / Self Care | Payer: Medicare Other | Source: Home / Self Care | Attending: Emergency Medicine | Admitting: Emergency Medicine

## 2013-12-12 ENCOUNTER — Emergency Department (HOSPITAL_COMMUNITY): Payer: Medicare Other

## 2013-12-12 DIAGNOSIS — I120 Hypertensive chronic kidney disease with stage 5 chronic kidney disease or end stage renal disease: Secondary | ICD-10-CM | POA: Insufficient documentation

## 2013-12-12 DIAGNOSIS — E119 Type 2 diabetes mellitus without complications: Secondary | ICD-10-CM

## 2013-12-12 DIAGNOSIS — N189 Chronic kidney disease, unspecified: Secondary | ICD-10-CM

## 2013-12-12 DIAGNOSIS — E875 Hyperkalemia: Principal | ICD-10-CM

## 2013-12-12 DIAGNOSIS — Z87891 Personal history of nicotine dependence: Secondary | ICD-10-CM

## 2013-12-12 DIAGNOSIS — Z992 Dependence on renal dialysis: Secondary | ICD-10-CM | POA: Insufficient documentation

## 2013-12-12 DIAGNOSIS — N179 Acute kidney failure, unspecified: Secondary | ICD-10-CM

## 2013-12-12 DIAGNOSIS — N039 Chronic nephritic syndrome with unspecified morphologic changes: Secondary | ICD-10-CM

## 2013-12-12 DIAGNOSIS — F411 Generalized anxiety disorder: Secondary | ICD-10-CM | POA: Diagnosis present

## 2013-12-12 DIAGNOSIS — M109 Gout, unspecified: Secondary | ICD-10-CM

## 2013-12-12 DIAGNOSIS — R0602 Shortness of breath: Secondary | ICD-10-CM

## 2013-12-12 DIAGNOSIS — Z79899 Other long term (current) drug therapy: Secondary | ICD-10-CM | POA: Insufficient documentation

## 2013-12-12 DIAGNOSIS — N186 End stage renal disease: Secondary | ICD-10-CM

## 2013-12-12 DIAGNOSIS — IMO0002 Reserved for concepts with insufficient information to code with codable children: Secondary | ICD-10-CM

## 2013-12-12 DIAGNOSIS — F329 Major depressive disorder, single episode, unspecified: Secondary | ICD-10-CM

## 2013-12-12 DIAGNOSIS — Z940 Kidney transplant status: Secondary | ICD-10-CM

## 2013-12-12 DIAGNOSIS — I509 Heart failure, unspecified: Secondary | ICD-10-CM

## 2013-12-12 DIAGNOSIS — Z9119 Patient's noncompliance with other medical treatment and regimen: Secondary | ICD-10-CM

## 2013-12-12 DIAGNOSIS — F3289 Other specified depressive episodes: Secondary | ICD-10-CM | POA: Diagnosis present

## 2013-12-12 DIAGNOSIS — Z888 Allergy status to other drugs, medicaments and biological substances status: Secondary | ICD-10-CM

## 2013-12-12 DIAGNOSIS — Z91199 Patient's noncompliance with other medical treatment and regimen due to unspecified reason: Secondary | ICD-10-CM

## 2013-12-12 DIAGNOSIS — J9600 Acute respiratory failure, unspecified whether with hypoxia or hypercapnia: Secondary | ICD-10-CM

## 2013-12-12 DIAGNOSIS — N2581 Secondary hyperparathyroidism of renal origin: Secondary | ICD-10-CM

## 2013-12-12 DIAGNOSIS — I10 Essential (primary) hypertension: Secondary | ICD-10-CM

## 2013-12-12 DIAGNOSIS — E1110 Type 2 diabetes mellitus with ketoacidosis without coma: Secondary | ICD-10-CM

## 2013-12-12 DIAGNOSIS — D631 Anemia in chronic kidney disease: Secondary | ICD-10-CM

## 2013-12-12 LAB — BASIC METABOLIC PANEL
BUN: 45 mg/dL — AB (ref 6–23)
BUN: 54 mg/dL — ABNORMAL HIGH (ref 6–23)
BUN: 53 mg/dL — ABNORMAL HIGH (ref 6–23)
CALCIUM: 9.2 mg/dL (ref 8.4–10.5)
CALCIUM: 9.5 mg/dL (ref 8.4–10.5)
CO2: 21 mEq/L (ref 19–32)
CO2: 21 mEq/L (ref 19–32)
CO2: 18 mEq/L — ABNORMAL LOW (ref 19–32)
Calcium: 9.2 mg/dL (ref 8.4–10.5)
Chloride: 99 mEq/L (ref 96–112)
Chloride: 99 mEq/L (ref 96–112)
Chloride: 99 mEq/L (ref 96–112)
Creatinine, Ser: 12.38 mg/dL — ABNORMAL HIGH (ref 0.50–1.35)
Creatinine, Ser: 13.4 mg/dL — ABNORMAL HIGH (ref 0.50–1.35)
Creatinine, Ser: 13.51 mg/dL — ABNORMAL HIGH (ref 0.50–1.35)
GFR calc Af Amer: 4 mL/min — ABNORMAL LOW (ref >90)
GFR calc non Af Amer: 4 mL/min — ABNORMAL LOW (ref >90)
GFR, EST AFRICAN AMERICAN: 5 mL/min — AB (ref >90)
GFR, EST AFRICAN AMERICAN: 4 mL/min — AB (ref >90)
GFR, EST NON AFRICAN AMERICAN: 4 mL/min — AB (ref >90)
GFR, EST NON AFRICAN AMERICAN: 4 mL/min — AB (ref >90)
Glucose, Bld: 85 mg/dL (ref 70–99)
Glucose, Bld: 116 mg/dL — ABNORMAL HIGH (ref 70–99)
Glucose, Bld: 101 mg/dL — ABNORMAL HIGH (ref 70–99)
POTASSIUM: 5.5 meq/L — AB (ref 3.7–5.3)
Potassium: 7.7 mEq/L (ref 3.7–5.3)
Potassium: 6.8 mEq/L (ref 3.7–5.3)
SODIUM: 135 meq/L — AB (ref 137–147)
Sodium: 137 mEq/L (ref 137–147)
Sodium: 133 mEq/L — ABNORMAL LOW (ref 137–147)

## 2013-12-12 LAB — PRO B NATRIURETIC PEPTIDE
Pro B Natriuretic peptide (BNP): >70000 pg/mL — ABNORMAL HIGH (ref 0–125)
Pro B Natriuretic peptide (BNP): >70000 pg/mL — ABNORMAL HIGH (ref 0–125)

## 2013-12-12 LAB — CBC
HCT: 28.4 % — ABNORMAL LOW (ref 39.0–52.0)
HEMATOCRIT: 29.3 % — AB (ref 39.0–52.0)
Hemoglobin: 9.4 g/dL — ABNORMAL LOW (ref 13.0–17.0)
Hemoglobin: 8.8 g/dL — ABNORMAL LOW (ref 13.0–17.0)
MCH: 26.6 pg (ref 26.0–34.0)
MCH: 25.7 pg — ABNORMAL LOW (ref 26.0–34.0)
MCHC: 32.1 g/dL (ref 30.0–36.0)
MCHC: 31 g/dL (ref 30.0–36.0)
MCV: 83 fL (ref 78.0–100.0)
MCV: 83 fL (ref 78.0–100.0)
Platelets: 254 10*3/uL (ref 150–400)
Platelets: 221 10*3/uL (ref 150–400)
RBC: 3.53 MIL/uL — ABNORMAL LOW (ref 4.22–5.81)
RBC: 3.42 MIL/uL — ABNORMAL LOW (ref 4.22–5.81)
RDW: 18.9 % — AB (ref 11.5–15.5)
RDW: 19 % — ABNORMAL HIGH (ref 11.5–15.5)
WBC: 8.4 10*3/uL (ref 4.0–10.5)
WBC: 6 10*3/uL (ref 4.0–10.5)

## 2013-12-12 LAB — TROPONIN I
Troponin I: <0.3 ng/mL (ref <0.30)
Troponin I: <0.3 ng/mL (ref <0.30)
Troponin I: <0.3 ng/mL (ref <0.30)

## 2013-12-12 LAB — GLUCOSE, CAPILLARY: Glucose-Capillary: 169 mg/dL — ABNORMAL HIGH (ref 70–99)

## 2013-12-12 LAB — POTASSIUM: POTASSIUM: 7.1 meq/L — AB (ref 3.7–5.3)

## 2013-12-12 LAB — TSH: TSH: 1.47 u[IU]/mL (ref 0.350–4.500)

## 2013-12-12 MED ORDER — DIPHENHYDRAMINE HCL 25 MG PO CAPS
25.0000 mg | ORAL_CAPSULE | Freq: Once | ORAL | Status: AC
  Administered 2013-12-12: 25 mg via ORAL

## 2013-12-12 MED ORDER — PENTAFLUOROPROP-TETRAFLUOROETH EX AERO: 1.0000 "application " | INHALATION_SPRAY | CUTANEOUS | Status: DC | PRN

## 2013-12-12 MED ORDER — ALTEPLASE 2 MG IJ SOLR
2.0000 mg | Freq: Once | INTRAMUSCULAR | Status: AC | PRN
Start: 2013-12-12 — End: 2013-12-12

## 2013-12-12 MED ORDER — LISINOPRIL 10 MG PO TABS
10.0000 mg | ORAL_TABLET | Freq: Every day | ORAL | Status: DC
  Administered 2013-12-13: 10 mg via ORAL
  Filled 2013-12-12: qty 1

## 2013-12-12 MED ORDER — CINACALCET HCL 30 MG PO TABS
30.0000 mg | ORAL_TABLET | Freq: Every day | ORAL | Status: DC
  Administered 2013-12-13: 30 mg via ORAL
  Filled 2013-12-12 (×2): qty 1

## 2013-12-12 MED ORDER — HYDROCODONE-ACETAMINOPHEN 5-325 MG PO TABS
1.0000 | ORAL_TABLET | ORAL | Status: DC | PRN
  Filled 2013-12-12: qty 2

## 2013-12-12 MED ORDER — SODIUM CHLORIDE 0.9 % IV SOLN
1.0000 g | Freq: Once | INTRAVENOUS | Status: AC
  Administered 2013-12-12: 1 g via INTRAVENOUS
  Filled 2013-12-12: qty 10

## 2013-12-12 MED ORDER — ONDANSETRON HCL 4 MG/2ML IJ SOLN: 4.0000 mg | Freq: Four times a day (QID) | INTRAMUSCULAR | Status: DC | PRN

## 2013-12-12 MED ORDER — TACROLIMUS 1 MG PO CAPS
10.0000 mg | ORAL_CAPSULE | Freq: Two times a day (BID) | ORAL | Status: DC
  Administered 2013-12-12 – 2013-12-13 (×2): 10 mg via ORAL
  Filled 2013-12-12 (×3): qty 10

## 2013-12-12 MED ORDER — LIDOCAINE HCL (PF) 1 % IJ SOLN: 5.0000 mL | INTRAMUSCULAR | Status: DC | PRN

## 2013-12-12 MED ORDER — HEPARIN SODIUM (PORCINE) 1000 UNIT/ML DIALYSIS: 2000.0000 [IU] | INTRAMUSCULAR | Status: DC | PRN

## 2013-12-12 MED ORDER — DEXTROSE 50 % IV SOLN
1.0000 | Freq: Once | INTRAVENOUS | Status: AC
  Administered 2013-12-12 – 2013-12-13 (×2): 50 mL via INTRAVENOUS
  Filled 2013-12-12: qty 50

## 2013-12-12 MED ORDER — HEPARIN SODIUM (PORCINE) 1000 UNIT/ML DIALYSIS: 1000.0000 [IU] | INTRAMUSCULAR | Status: DC | PRN

## 2013-12-12 MED ORDER — SODIUM CHLORIDE 0.9 % IV SOLN: 100.0000 mL | INTRAVENOUS | Status: DC | PRN

## 2013-12-12 MED ORDER — SODIUM CHLORIDE 0.9 % IJ SOLN
3.0000 mL | Freq: Two times a day (BID) | INTRAMUSCULAR | Status: DC
Start: 2013-12-12 — End: 2013-12-13
  Administered 2013-12-12 – 2013-12-13 (×2): 3 mL via INTRAVENOUS

## 2013-12-12 MED ORDER — COLCHICINE 0.6 MG PO TABS
0.6000 mg | ORAL_TABLET | Freq: Every day | ORAL | Status: DC | PRN
  Filled 2013-12-12: qty 1

## 2013-12-12 MED ORDER — ALLOPURINOL 100 MG PO TABS
100.0000 mg | ORAL_TABLET | Freq: Every day | ORAL | Status: DC
  Administered 2013-12-13: 100 mg via ORAL
  Filled 2013-12-12: qty 1

## 2013-12-12 MED ORDER — CLONIDINE HCL 0.3 MG PO TABS
0.3000 mg | ORAL_TABLET | Freq: Three times a day (TID) | ORAL | Status: DC
  Administered 2013-12-13 (×2): 0.3 mg via ORAL
  Filled 2013-12-12 (×4): qty 1

## 2013-12-12 MED ORDER — INSULIN ASPART 100 UNIT/ML IV SOLN
10.0000 [IU] | Freq: Once | INTRAVENOUS | Status: AC
  Administered 2013-12-12: 10 [IU] via INTRAVENOUS
  Filled 2013-12-12: qty 0.1

## 2013-12-12 MED ORDER — VALGANCICLOVIR HCL 450 MG PO TABS
450.0000 mg | ORAL_TABLET | ORAL | Status: DC
Start: 2013-12-13 — End: 2013-12-13
  Administered 2013-12-13: 450 mg via ORAL
  Filled 2013-12-12: qty 1

## 2013-12-12 MED ORDER — LIDOCAINE-PRILOCAINE 2.5-2.5 % EX CREA
1.0000 | TOPICAL_CREAM | CUTANEOUS | Status: DC | PRN
Start: 2013-12-12 — End: 2013-12-13

## 2013-12-12 MED ORDER — HEPARIN SODIUM (PORCINE) 5000 UNIT/ML IJ SOLN
5000.0000 [IU] | Freq: Three times a day (TID) | INTRAMUSCULAR | Status: DC
  Filled 2013-12-12 (×4): qty 1

## 2013-12-12 MED ORDER — SODIUM POLYSTYRENE SULFONATE 15 GM/60ML PO SUSP
60.0000 g | Freq: Once | ORAL | Status: AC
  Administered 2013-12-12: 60 g via ORAL
  Filled 2013-12-12: qty 240

## 2013-12-12 MED ORDER — LABETALOL HCL 200 MG PO TABS
200.0000 mg | ORAL_TABLET | Freq: Two times a day (BID) | ORAL | Status: DC
  Administered 2013-12-13 (×2): 200 mg via ORAL
  Filled 2013-12-12 (×3): qty 1

## 2013-12-12 MED ORDER — AMLODIPINE BESYLATE 10 MG PO TABS
10.0000 mg | ORAL_TABLET | Freq: Every day | ORAL | Status: DC
  Administered 2013-12-13: 10 mg via ORAL
  Filled 2013-12-12: qty 1

## 2013-12-12 MED ORDER — NEPRO/CARBSTEADY PO LIQD: 237.0000 mL | ORAL | Status: DC | PRN

## 2013-12-12 MED ORDER — LORAZEPAM 2 MG/ML IJ SOLN: 1.0000 mg | Freq: Once | INTRAMUSCULAR | Status: DC

## 2013-12-12 MED ORDER — MINOXIDIL 2.5 MG PO TABS
2.5000 mg | ORAL_TABLET | Freq: Every day | ORAL | Status: DC
  Administered 2013-12-13: 2.5 mg via ORAL
  Filled 2013-12-12: qty 1

## 2013-12-12 MED ORDER — FUROSEMIDE 80 MG PO TABS
80.0000 mg | ORAL_TABLET | Freq: Every day | ORAL | Status: DC
  Administered 2013-12-13: 80 mg via ORAL
  Filled 2013-12-12: qty 1

## 2013-12-12 MED ORDER — ONDANSETRON HCL 4 MG PO TABS: 4.0000 mg | ORAL_TABLET | Freq: Four times a day (QID) | ORAL | Status: DC | PRN

## 2013-12-12 MED ORDER — ONDANSETRON 4 MG PO TBDP
4.0000 mg | ORAL_TABLET | Freq: Once | ORAL | Status: AC
  Administered 2013-12-12: 4 mg via ORAL
  Filled 2013-12-12: qty 1

## 2013-12-12 MED ORDER — PANTOPRAZOLE SODIUM 40 MG PO TBEC
40.0000 mg | DELAYED_RELEASE_TABLET | Freq: Every day | ORAL | Status: DC
  Administered 2013-12-13: 40 mg via ORAL
  Filled 2013-12-12: qty 1

## 2013-12-12 MED ORDER — DEXTROSE 10 % IV SOLN
Freq: Once | INTRAVENOUS | Status: DC
  Filled 2013-12-12: qty 1000

## 2013-12-12 MED ORDER — PREDNISONE 5 MG PO TABS
15.0000 mg | ORAL_TABLET | Freq: Every day | ORAL | Status: DC
  Administered 2013-12-13: 15 mg via ORAL
  Filled 2013-12-12 (×2): qty 1

## 2013-12-12 MED ORDER — SODIUM BICARBONATE 8.4 % IV SOLN
25.0000 meq | Freq: Once | INTRAVENOUS | Status: AC
  Administered 2013-12-12: 25 meq via INTRAVENOUS
  Filled 2013-12-12: qty 50

## 2013-12-12 NOTE — Consult Note (Signed)
Renal Service Consult Note [facility]  Frank [name] 12/12/2013 [name] Requesting Physician:  Dr Doyle Askew   Reason for Consult:  ESRD pt from [facility] with hyperkalemia and pulm edema HPI: The patient is a 48 y.o. year-old presented to ED last night / early this am with chest pain. He vomited in ED. He said he thought he needed dialysis because he did not get to his dry wt on his last HD. CXR was neg, he was not hypoxic and K was 5.5 so pt was d/c'd home. He returned to ED this afternoon w worse CP reporting last HD on Thursday (TTS schedule) and up "13 lbs" and needing dialysis.  K+ this time was up at 7.7.  CXR showing now bilat infiltrates/edema. Renal svc asked to see for possibility of dialysis.   Denies any prod cough, hemoptysis, seizures, confusion. +N/V, no abd pain or diarrhea. No HA. Dry wt around 170 lbs. Never has had a permanent access, uses catheter for HD x 10 years according to pt.  ROS  no jt pain or rash  Past Medical History  Past Medical History  Diagnosis Date  . Hypertension   . ESRD (end stage renal disease)     due to HTN per patient, followed at [facility], s/p failed kidney transplant  . Depression   . Complication of anesthesia     itching, sore throat  . Diabetes mellitus without complication     No history per patient, but remains under history as A1c would not be accurate given on dialysis  . Shortness of breath   . Anxiety    Past Surgical History  Past Surgical History  Procedure Laterality Date  . Kidney receipient  2006    failed and started HD in March 2014  . Capd insertion    . Capd removal     Family History History reviewed. No pertinent family history. Social History  reports that he has quit smoking. His smoking use included Cigarettes. He has a 1 pack-year smoking history. He has never used smokeless tobacco. He reports that he does not drink alcohol or use illicit drugs. Allergies  Allergies  Allergen Reactions   . Ferrlecit [Na Ferric Gluc Cplx In Sucrose] Shortness Of Breath, Swelling and Other (See Comments)    Swelling in throat  . Darvocet [Propoxyphene N-Acetaminophen] Hives   Home medications Prior to Admission medications   Medication Sig Start Date End Date Taking? Authorizing Provider  allopurinol (ZYLOPRIM) 100 MG tablet Take 1 tablet (100 mg total) by mouth daily. 04/21/13  Yes [name], MD  amLODipine (NORVASC) 5 MG tablet Take 10 mg by mouth daily.   Yes Historical Provider, MD  cinacalcet (SENSIPAR) 30 MG tablet Take 30 mg by mouth daily.   Yes Historical Provider, MD  cloNIDine (CATAPRES) 0.3 MG tablet Take 1 tablet (0.3 mg total) by mouth 3 (three) times daily. 10/18/13  Yes [name], MD  colchicine 0.6 MG tablet Take 0.6 mg by mouth daily as needed (for gout). 04/13/13  Yes [name], MD  furosemide (LASIX) 80 MG tablet Take 80 mg by mouth daily.   Yes Historical Provider, MD  labetalol (NORMODYNE) 200 MG tablet Take 200 mg by mouth 2 (two) times daily.    Yes Historical Provider, MD  lisinopril (PRINIVIL,ZESTRIL) 10 MG tablet Take 10 mg by mouth daily. 05/11/13  Yes Historical Provider, MD  minoxidil (LONITEN) 2.5 MG tablet Take 2.5 mg by mouth daily.  10/12/13  Yes Historical Provider, MD  omeprazole (PRILOSEC) 20 MG capsule Take 20 mg by mouth daily. 07/01/13  Yes Historical Provider, MD  ondansetron (ZOFRAN ODT) 8 MG disintegrating tablet Take 1 tablet (8 mg total) by mouth every 8 (eight) hours as needed for nausea or vomiting. 12/05/13  Yes Dorie Rank, MD  predniSONE (DELTASONE) 5 MG tablet Take 15 mg by mouth daily with breakfast.   Yes Historical Provider, MD  tacrolimus (PROGRAF) 5 MG capsule Take 10 mg by mouth 2 (two) times daily.    Yes Historical Provider, MD  valGANciclovir (VALCYTE) 450 MG tablet Take 450 mg by mouth every Monday, Wednesday, and Friday.   Yes Historical Provider, MD   Liver Function Tests No results found for this basename: AST, ALT,  ALKPHOS, BILITOT, PROT, ALBUMIN,  in the last 168 hours No results found for this basename: LIPASE, AMYLASE,  in the last 168 hours CBC  Recent Labs Lab 12/12/13 0233 12/12/13 1623  WBC 8.4 6.0  HGB 9.4* 8.8*  HCT 29.3* 28.4*  MCV 83.0 83.0  PLT 254 460   Basic Metabolic Panel  Recent Labs Lab 12/12/13 0233 12/12/13 1623 12/12/13 1758 12/12/13 1940  NA 137 133*  --  135*  K 5.5* 7.7* 7.1* 6.8*  CL 99 99  --  99  CO2 21 21  --  18*  GLUCOSE 85 116*  --  101*  BUN 45* 54*  --  53*  CREATININE 12.38* 13.40*  --  13.51*  CALCIUM 9.2 9.2  --  9.5    Filed Vitals:   12/12/13 1900 12/12/13 1915 12/12/13 1930 12/12/13 2002  BP: 174/120 178/124 183/119 190/120  Pulse: 63 69 72 78  Temp:    98.2 F (36.8 C)  TempSrc:    Oral  Resp: _0 Height:    6' 2" (1.88 m)  Weight:    80.831 kg (178 lb 3.2 oz)  SpO2: 100% 99% 100% 100%   Exam: Not in distress No rash, cyanosis or gangrene Sclera anicteric, throat clear +JVD Chest bilat basilar rales 1/3 up scattered RRR no MRG Abd soft, mildly swollen, NT, +BS no ascites GU normal male Moderate bilat pretibial edema Neuro is NF, Ox3 R IJ cath present, no AVF/AVG   Dialysis: [facility] TTS 178 lbs  IJ cath  Assessment: 1 Hyperkalemia- due to missed HD yest 2 ESRD on HD x 10 yrs 3 HTN 4 SOB/ pulm edema / vol excess   Plan- HD tonight for K+, solute and vol excess. Repeat K in am.    Kelly Splinter MD (pgr) [phone]    [phone] 12/12/2013, 9:37 PM

## 2013-12-12 NOTE — ED Notes (Signed)
Report called to [facility], RN.

## 2013-12-12 NOTE — ED Notes (Signed)
Pt ordered renal diet breakfast Per MD order

## 2013-12-12 NOTE — ED Notes (Addendum)
Pt states that he has had chest pain since waking this morning. Pt states that he was seen here last night for the same. Pt was discharged with minimal pain and went home and went to sleep and work with increased pain. Pt states that he has difficulty sleeping because pain is worse with lying and sitting. Pt had dialysis on Thursday. He is scheduled to go on Tuesday.

## 2013-12-12 NOTE — ED Notes (Signed)
The pt is a dialysis pt  Dialysis cath rt upper chest.  Last dialyzed earlier today.  Pt c/o mid chest pain since he awakened approx one hour ago with sob.  The pain is worse with inspiration he denies having a cold

## 2013-12-12 NOTE — ED Provider Notes (Signed)
CSN: [id number]     Arrival date & time 12/12/13  1554 History   First MD Initiated Contact with Patient 12/12/13 1613     Chief Complaint  Patient presents with  . Chest Pain  . Shortness of Breath     (Consider location/radiation/quality/duration/timing/severity/associated sxs/prior Treatment) HPI Comments: Patient is a 48 year old male past medical history significant for end-stage renal disease on dialysis Tuesday, Thursday, Saturday, hypertension, depression, and DM presenting to the emergency department again today for chest pain and shortness of breath. Patient was seen this morning and had a negative workup and felt better and was sent home. Patient states that he took a nap awoke and the symptoms returned. Patient states he is overdue for dialysis, but told the ER physician this AM that he received dialysis less than 24 hours prior to arrival to the emergency department. He states that he bit his goal weight at that throughout this session but "is 13 pounds over his weight." Alleviating factors: none. Aggravating factors: movement, palpation, deep inspiration, laying flat. Medications tried prior to arrival: none.      Past Medical History  Diagnosis Date  . Hypertension   . ESRD (end stage renal disease)     due to HTN per patient, followed at [facility], s/p failed kidney transplant  . Depression   . Complication of anesthesia     itching, sore throat  . Diabetes mellitus without complication     No history per patient, but remains under history as A1c would not be accurate given on dialysis  . Shortness of breath   . Anxiety    Past Surgical History  Procedure Laterality Date  . Kidney receipient  2006    failed and started HD in March 2014  . Capd insertion    . Capd removal     History reviewed. No pertinent family history. History  Substance Use Topics  . Smoking status: Former Smoker -- 1.00 packs/day for 1 years    Types: Cigarettes  . Smokeless tobacco: Never  Used     Comment: quit Jan 2014  . Alcohol Use: No    Review of Systems  Constitutional: Negative for fever and chills.  Respiratory: Positive for shortness of breath. Negative for cough.   Cardiovascular: Positive for chest pain. Negative for palpitations and leg swelling.  All other systems reviewed and are negative.     Allergies  Ferrlecit and Darvocet  Home Medications   Prior to Admission medications   Medication Sig Start Date End Date Taking? Authorizing Provider  allopurinol (ZYLOPRIM) 100 MG tablet Take 1 tablet (100 mg total) by mouth daily. 04/21/13  Yes [name], MD  amLODipine (NORVASC) 5 MG tablet Take 10 mg by mouth daily.   Yes Historical Provider, MD  cinacalcet (SENSIPAR) 30 MG tablet Take 30 mg by mouth daily.   Yes Historical Provider, MD  cloNIDine (CATAPRES) 0.3 MG tablet Take 1 tablet (0.3 mg total) by mouth 3 (three) times daily. 10/18/13  Yes [name], MD  colchicine 0.6 MG tablet Take 0.6 mg by mouth daily as needed (for gout). 04/13/13  Yes [name], MD  furosemide (LASIX) 80 MG tablet Take 80 mg by mouth daily.   Yes Historical Provider, MD  labetalol (NORMODYNE) 200 MG tablet Take 200 mg by mouth 2 (two) times daily.    Yes Historical Provider, MD  lisinopril (PRINIVIL,ZESTRIL) 10 MG tablet Take 10 mg by mouth daily. 05/11/13  Yes Historical Provider, MD  minoxidil (  LONITEN) 2.5 MG tablet Take 2.5 mg by mouth daily.  10/12/13  Yes Historical Provider, MD  omeprazole (PRILOSEC) 20 MG capsule Take 20 mg by mouth daily. 07/01/13  Yes Historical Provider, MD  ondansetron (ZOFRAN ODT) 8 MG disintegrating tablet Take 1 tablet (8 mg total) by mouth every 8 (eight) hours as needed for nausea or vomiting. 12/05/13  Yes Dorie Rank, MD  predniSONE (DELTASONE) 5 MG tablet Take 15 mg by mouth daily with breakfast.   Yes Historical Provider, MD  tacrolimus (PROGRAF) 5 MG capsule Take 10 mg by mouth 2 (two) times daily.    Yes Historical Provider, MD   valGANciclovir (VALCYTE) 450 MG tablet Take 450 mg by mouth every Monday, Wednesday, and Friday.   Yes Historical Provider, MD   BP 190/120  Pulse 78  Temp(Src) 98.2 F (36.8 C) (Oral)  Resp 18  Ht _0  (1.88 m)  Wt 178 lb 3.2 oz (80.831 kg)  BMI 22.87 kg/m2  SpO2 100% Physical Exam  Nursing note and vitals reviewed. Constitutional: He is oriented to person, place, and time. He appears well-developed and well-nourished. No distress.  Patient talking in complete sentences without evidence of distress.  HENT:  Head: Normocephalic and atraumatic.  Right Ear: External ear normal.  Left Ear: External ear normal.  Nose: Nose normal.  Mouth/Throat: Oropharynx is clear and moist. No oropharyngeal exudate.  Eyes: Conjunctivae are normal.  Neck: Normal range of motion. Neck supple.  Cardiovascular: Normal rate, regular rhythm and normal heart sounds.   Pulmonary/Chest: Effort normal and breath sounds normal. No accessory muscle usage or stridor. No respiratory distress. He has no decreased breath sounds. He has no wheezes. He has no rhonchi. He has no rales. He exhibits tenderness.  Abdominal: Soft.  Lymphadenopathy:    He has no cervical adenopathy.  Neurological: He is alert and oriented to person, place, and time.  Skin: Skin is warm and dry. He is not diaphoretic.    ED Course  Procedures (including critical care time) Medications  cloNIDine (CATAPRES) tablet 0.3 mg (not administered)  minoxidil (LONITEN) tablet 2.5 mg (not administered)  predniSONE (DELTASONE) tablet 15 mg (not administered)  colchicine tablet 0.6 mg (not administered)  furosemide (LASIX) tablet 80 mg (not administered)  cinacalcet (SENSIPAR) tablet 30 mg (not administered)  lisinopril (PRINIVIL,ZESTRIL) tablet 10 mg (not administered)  pantoprazole (PROTONIX) EC tablet 40 mg (not administered)  amLODipine (NORVASC) tablet 10 mg (not administered)  labetalol (NORMODYNE) tablet 200 mg (not administered)   allopurinol (ZYLOPRIM) tablet 100 mg (not administered)  valGANciclovir (VALCYTE) 450 MG tablet TABS 450 mg (not administered)  tacrolimus (PROGRAF) capsule 10 mg (not administered)  heparin injection 5,000 Units (not administered)  sodium chloride 0.9 % injection 3 mL (not administered)  HYDROcodone-acetaminophen (NORCO/VICODIN) 5-325 MG per tablet 1-2 tablet (not administered)  ondansetron (ZOFRAN) tablet 4 mg (not administered)    Or  ondansetron (ZOFRAN) injection 4 mg (not administered)  insulin aspart (novoLOG) injection 10 Units (not administered)  dextrose 50 % solution 50 mL (not administered)  dextrose 10 % infusion (not administered)  sodium polystyrene (KAYEXALATE) 15 GM/60ML suspension 60 g (60 g Oral Given 12/12/13 1810)  sodium bicarbonate injection 25 mEq (25 mEq Intravenous Given 12/12/13 1820)  calcium gluconate 1 g in sodium chloride 0.9 % 100 mL IVPB (0 g Intravenous Stopped 12/12/13 1918)    Labs Review Labs Reviewed  CBC - Abnormal; Notable for the following:    RBC 3.42 (*)    Hemoglobin 8.8 (*)  HCT 28.4 (*)    MCH 25.7 (*)    RDW 19.0 (*)    All other components within normal limits  BASIC METABOLIC PANEL - Abnormal; Notable for the following:    Sodium 133 (*)    Potassium 7.7 (*)    Glucose, Bld 116 (*)    BUN 54 (*)    Creatinine, Ser 13.40 (*)    GFR calc non Af Amer 4 (*)    GFR calc Af Amer 4 (*)    All other components within normal limits  PRO B NATRIURETIC PEPTIDE - Abnormal; Notable for the following:    Pro B Natriuretic peptide (BNP) >70000.0 (*)    All other components within normal limits  POTASSIUM - Abnormal; Notable for the following:    Potassium 7.1 (*)    All other components within normal limits  TROPONIN I  BASIC METABOLIC PANEL  BASIC METABOLIC PANEL  CBC  TSH  TROPONIN I  TROPONIN I  TROPONIN I  URINE RAPID DRUG SCREEN (HOSP PERFORMED)    Imaging Review Dg Chest 2 View  12/12/2013   CLINICAL DATA:  Mid chest pain  and shortness of breath.  EXAM: CHEST  2 VIEW  COMPARISON:  Chest radiograph performed 12/05/2013  FINDINGS: The lungs are well-aerated. Vascular congestion is noted. Mild bilateral central opacities may reflect underlying chronic interstitial lung disease or recurrent interstitial edema. No definite pleural effusion or pneumothorax is seen.  The heart is mildly enlarged. A right-sided dual-lumen catheter is noted ending about the cavoatrial junction. No acute osseous abnormalities are seen.  IMPRESSION: Vascular congestion and mild cardiomegaly noted. Mild bilateral central airspace opacities reflect may reflect underlying chronic interstitial lung disease or recurrent interstitial edema.   Electronically Signed   By: Garald Balding M.D.   On: 12/12/2013 04:14     EKG Interpretation   Date/Time:  Sunday Dec 12 2013 15:59:32 EDT Ventricular Rate:  68 PR Interval:  178 QRS Duration: 82 QT Interval:  448 QTC Calculation: 476 R Axis:   -43 Text Interpretation:  Normal sinus rhythm Possible Left atrial enlargement  Left axis deviation Left ventricular hypertrophy with repolarization  abnormality Abnormal ECG Confirmed by Wilson Singer  MD, STEPHEN ([id number]) on  12/12/2013 5:47:20 PM      MDM   Final diagnoses:  CHF (congestive heart failure)  Acute respiratory failure  Acute-on-chronic renal failure  DM (diabetes mellitus) type 2, uncontrolled, with ketoacidosis  Essential hypertension, malignant  ESRD on dialysis- Tues, Thurs, Sat in [facility], nephro: Dr Hall Busing Vitals:   12/12/13 2002  BP: 190/120  Pulse: 78  Temp: 98.2 F (36.8 C)  Resp: 18   Afebrile, NAD, non-toxic appearing, AAOx4. Patient presented back to the emergency department for continued shortness of breath and chest pain. In addition patient states he has not been dialyzed since last Thursday. Repeat labs and EKG obtained. Patient is noted to be hyperkalemic at 7.7, slight hemolysis repeated and it is 7.1. Chest x-ray  this morning showed vascular congestion likely related to need for dialysis. Sodium bicarbonate, Kayexalate, calcium gluconate administered.  5:14 PM Patient ambulated to restroom without any difficulty or evidence of increased work of breathing.   7:43 PM Discussed patient with Dr. Jonnie Finner who will see the patient this evening. Requesting insulin and dextrose be administered as well.   Patient admitted to [facility] for hyperkalemia and need for dialysis. Dr. Zettie Pho will be available for consultation and dialysis this evening. Patient d/w with  Dr. Wilson Singer, agrees with plan.        Harlow Mares, PA-C 12/12/13 2025

## 2013-12-12 NOTE — ED Notes (Signed)
Called tray service and change room to 3E30.

## 2013-12-12 NOTE — ED Notes (Signed)
Pt actively vomiting, will order meds

## 2013-12-12 NOTE — ED Notes (Signed)
Ambulated patient in the hall. O2 remained at 99 or 100% and HR stayed between 84-87

## 2013-12-12 NOTE — H&P (Addendum)
[facility] History and Physical  Frank [name] QJJ:[id number] DOB: [birth year] DOA: 12/12/2013  Referring physician: ED physician PCP: Adin Hector   Chief Complaint: chest pain   HPI:  48 year old male with ESRD on HD TTS, on immunosuppresants, uncontrolled HTN, depression, presented to [facility] ED with main concern of several days duration of substernal chest pressure, intermittent, 5/10 in severity, seen earlier in ED and sent home after work up was negative only to return with worsening discomfort. Please note that pt is somewhat poor historian and initially said he had HD yesterday but on other occasion said he actually ski[[ed last several sessions. Pt denies any specific alleviating or aggravating factors, believes he is volume overloaded and over 10 lbs above his dry weight. He also reports exertional dyspnea as well as dyspnea at rest, orthopnea and LE swelling. He denies fevers, chills, no specific abdominal concerns.   In ED, pt hemodynamically stable, BMP significant for K 7.7, on exam pt volume overloaded and TRH asked to admit for further evaluation and ? Need for urgent HD. Nephrology team consulted.   Assessment and Plan: Active Problems: Chest pain - in the setting of hyperkalemia - will admit to telemetry bed, cycle CE's, follow upon nephrology recommendations - pt has received one dose of calcium gluconate and kayexalate in ED, will repeat BMP now - obtain UDS if possible  ESRD on HD - nephrology consulted, will follow up on rec's Hyperkalemia - received one dose of kayexalate and calcium gluconate in ED - repeat BMP now  HTN, accelerated - continue home medical regimen Norvasc, Clonidine, Labetalol, Lasix, Lisinopril  - may need to stop Lisinopril given hyperK Medical non compliance  - will need to readdress once pt feels better  Anemia of chronic renal disease  - no signs of active bleeding - repeat CBC in AM  Radiological Exams on Admission: Dg Chest  2 View   12/12/2013   Vascular congestion and mild cardiomegaly noted. Mild bilateral central airspace opacities reflect may reflect underlying chronic interstitial lung disease or recurrent interstitial edema.     Code Status: Full Family Communication: Pt at bedside Disposition Plan: Admit for further evaluation     Review of Systems:  Constitutional: Negative for diaphoresis.  HENT: Negative for hearing loss, congestion, sore throat, neck pain, tinnitus and ear discharge.   Eyes: Negative for blurred vision, double vision, photophobia, pain, discharge and redness.  Respiratory: Negative for cough, hemoptysis, wheezing and stridor.   Cardiovascular: Per HPI.  Gastrointestinal: Negative for heartburn, constipation, blood in stool and melena.  Genitourinary: Negative for dysuria, urgency, frequency, hematuria and flank pain.  Musculoskeletal: Negative for myalgias, back pain, joint pain and falls.  Skin: Negative for itching and rash.  Neurological: Negative for dizziness and weakness, headaches.  Endo/Heme/Allergies: Negative for environmental allergies and polydipsia. Does not bruise/bleed easily.  Psychiatric/Behavioral: Negative for suicidal ideas. The patient is not nervous/anxious.      Past Medical History  Diagnosis Date  . Hypertension   . ESRD (end stage renal disease)     due to HTN per patient, followed at [facility], s/p failed kidney transplant  . Depression   . Complication of anesthesia     itching, sore throat  . Diabetes mellitus without complication     No history per patient, but remains under history as A1c would not be accurate given on dialysis    Past Surgical History  Procedure Laterality Date  . Kidney receipient  2006    failed and  started HD in March 2014  . Capd insertion    . Capd removal      Social History:  reports that he has quit smoking. His smoking use included Cigarettes. He has a 1 pack-year smoking history. He has never used smokeless  tobacco. He reports that he does not drink alcohol or use illicit drugs.  Allergies  Allergen Reactions  . Ferrlecit [Na Ferric Gluc Cplx In Sucrose] Shortness Of Breath, Swelling and Other (See Comments)    Swelling in throat  . Darvocet [Propoxyphene N-Acetaminophen] Hives   No known family medical history   Prior to Admission medications   Medication Sig Start Date End Date Taking? Authorizing Provider  allopurinol (ZYLOPRIM) 100 MG tablet Take 1 tablet (100 mg total) by mouth daily. 04/21/13  Yes [name], MD  amLODipine (NORVASC) 5 MG tablet Take 10 mg by mouth daily.   Yes Historical Provider, MD  cinacalcet (SENSIPAR) 30 MG tablet Take 30 mg by mouth daily.   Yes Historical Provider, MD  cloNIDine (CATAPRES) 0.3 MG tablet Take 1 tablet (0.3 mg total) by mouth 3 (three) times daily. 10/18/13  Yes [name], MD  colchicine 0.6 MG tablet Take 0.6 mg by mouth daily as needed (for gout). 04/13/13  Yes [name], MD  furosemide (LASIX) 80 MG tablet Take 80 mg by mouth daily.   Yes Historical Provider, MD  labetalol (NORMODYNE) 200 MG tablet Take 200 mg by mouth 2 (two) times daily.    Yes Historical Provider, MD  lisinopril (PRINIVIL,ZESTRIL) 10 MG tablet Take 10 mg by mouth daily. 05/11/13  Yes Historical Provider, MD  minoxidil (LONITEN) 2.5 MG tablet Take 2.5 mg by mouth daily.  10/12/13  Yes Historical Provider, MD  omeprazole (PRILOSEC) 20 MG capsule Take 20 mg by mouth daily. 07/01/13  Yes Historical Provider, MD  ondansetron (ZOFRAN ODT) 8 MG disintegrating tablet Take 1 tablet (8 mg total) by mouth every 8 (eight) hours as needed for nausea or vomiting. 12/05/13  Yes Dorie Rank, MD  predniSONE (DELTASONE) 5 MG tablet Take 15 mg by mouth daily with breakfast.   Yes Historical Provider, MD  tacrolimus (PROGRAF) 5 MG capsule Take 10 mg by mouth 2 (two) times daily.    Yes Historical Provider, MD  valGANciclovir (VALCYTE) 450 MG tablet Take 450 mg by mouth every Monday,  Wednesday, and Friday.   Yes Historical Provider, MD    Physical Exam: Filed Vitals:   12/12/13 1725 12/12/13 1730  BP: 181/133 176/131  Pulse: 62   Temp: 97.7 F (36.5 C)   TempSrc: Oral   Resp: 18   SpO2: 100%     Physical Exam  Constitutional: Appears well-developed and well-nourished. No distress.  HENT: Normocephalic. External right and left ear normal. Oropharynx is clear and moist.  Eyes: Conjunctivae and EOM are normal. PERRLA, no scleral icterus.  Neck: Normal ROM. Neck supple. No JVD. No tracheal deviation. No thyromegaly.  CVS: RRR, S1/S2 +, no gallops, no carotid bruit.  Pulmonary: Effort and breath sounds normal, no stridor, bibasilar crackles  Abdominal: Soft. BS +,  no distension, tenderness in epigastric area, no rebound or guarding.  Musculoskeletal: Normal range of motion.  Lymphadenopathy: No lymphadenopathy noted, cervical, inguinal. Neuro: Alert. Normal reflexes, muscle tone coordination. No cranial nerve deficit. Skin: Skin is warm and dry. No rash noted. Not diaphoretic. No erythema. No pallor.  Psychiatric: Normal mood and affect. Behavior, judgment, thought content normal.   Labs on Admission:  Basic  Metabolic Panel:  Recent Labs Lab 12/12/13 0233 12/12/13 1623  NA 137 133*  K 5.5* 7.7*  CL 99 99  CO2 21 21  GLUCOSE 85 116*  BUN 45* 54*  CREATININE 12.38* 13.40*  CALCIUM 9.2 9.2   CBC:  Recent Labs Lab 12/12/13 0233 12/12/13 1623  WBC 8.4 6.0  HGB 9.4* 8.8*  HCT 29.3* 28.4*  MCV 83.0 83.0  PLT 254 221   Cardiac Enzymes:  Recent Labs Lab 12/12/13 0233 12/12/13 1641  TROPONINI <0.30 <0.30   EKG: Normal sinus rhythm, no ST/T wave changes  [name], MD  [facility] Pager [phone]  If 7PM-7AM, please contact night-coverage [email or web address] Password [facility] 12/12/2013, 6:31 PM

## 2013-12-12 NOTE — ED Notes (Signed)
Food tray ordered for patient to be delivered to [id number].

## 2013-12-12 NOTE — ED Notes (Signed)
EDP made aware of patients nausea and vomiting. Pt states he is feeling better after vomiting. Pt placed back on monitor after walking to the restroom.

## 2013-12-12 NOTE — ED Provider Notes (Signed)
CSN: [id number]     Arrival date & time 12/12/13  0244 History   First MD Initiated Contact with Patient 12/12/13 [phone]     Chief Complaint  Patient presents with  . Chest Pain     (Consider location/radiation/quality/duration/timing/severity/associated sxs/prior Treatment) HPI  Patient is a 48 yo man with a history of ESRD, HTN, depression and DM. He was last dialyzed < 24 hrs prior to his ED presentation. He says he feels SOB and is concerned that he may need to be dialyzed again because he was not dialyzed to his normal dry weight. He has had fleeting episodes of chest tightness. No cough. No fever. No CP at this time. Patient denies history of CAD.   He reports compliance with all medications and denies any recent medication changes. Denies illicit drug use. Nothing makes his sx worse or better.   Past Medical History  Diagnosis Date  . Hypertension   . ESRD (end stage renal disease)     due to HTN per patient, followed at [facility], s/p failed kidney transplant  . Depression   . Complication of anesthesia     itching, sore throat  . Diabetes mellitus without complication     No history per patient, but remains under history as A1c would not be accurate given on dialysis   Past Surgical History  Procedure Laterality Date  . Kidney receipient  2006    failed and started HD in March 2014  . Capd insertion    . Capd removal     No family history on file. History  Substance Use Topics  . Smoking status: Former Smoker -- 1.00 packs/day for 1 years    Types: Cigarettes  . Smokeless tobacco: Never Used     Comment: quit Jan 2014  . Alcohol Use: No    Review of Systems Ten point review of symptoms performed and is negative with the exception of symptoms noted above.     Allergies  Ferrlecit and Darvocet  Home Medications   Prior to Admission medications   Medication Sig Start Date End Date Taking? Authorizing Provider  allopurinol (ZYLOPRIM) 100 MG tablet Take 1  tablet (100 mg total) by mouth daily. 04/21/13  Yes [name], MD  amLODipine (NORVASC) 5 MG tablet Take 10 mg by mouth daily.   Yes Historical Provider, MD  cinacalcet (SENSIPAR) 30 MG tablet Take 30 mg by mouth daily.   Yes Historical Provider, MD  cloNIDine (CATAPRES) 0.3 MG tablet Take 1 tablet (0.3 mg total) by mouth 3 (three) times daily. 10/18/13  Yes [name], MD  colchicine 0.6 MG tablet Take 0.6 mg by mouth daily as needed (for gout). 04/13/13  Yes [name], MD  furosemide (LASIX) 80 MG tablet Take 80 mg by mouth daily.   Yes Historical Provider, MD  labetalol (NORMODYNE) 200 MG tablet Take 200 mg by mouth 2 (two) times daily.    Yes Historical Provider, MD  lisinopril (PRINIVIL,ZESTRIL) 10 MG tablet Take 10 mg by mouth daily. 05/11/13  Yes Historical Provider, MD  minoxidil (LONITEN) 2.5 MG tablet Take 2.5 mg by mouth daily.  10/12/13  Yes Historical Provider, MD  omeprazole (PRILOSEC) 20 MG capsule Take 20 mg by mouth daily. 07/01/13  Yes Historical Provider, MD  ondansetron (ZOFRAN ODT) 8 MG disintegrating tablet Take 1 tablet (8 mg total) by mouth every 8 (eight) hours as needed for nausea or vomiting. 12/05/13  Yes Dorie Rank, MD  predniSONE (DELTASONE) 5 MG  tablet Take 15 mg by mouth daily with breakfast.   Yes Historical Provider, MD  tacrolimus (PROGRAF) 5 MG capsule Take 10 mg by mouth 2 (two) times daily.    Yes Historical Provider, MD  valGANciclovir (VALCYTE) 450 MG tablet Take 450 mg by mouth every Monday, Wednesday, and Friday.   Yes Historical Provider, MD   BP 162/144  Pulse 89  Temp(Src) 98.3 F (36.8 C)  Resp 17  Ht 6' 1.5" (1.867 m)  Wt 178 lb (80.74 kg)  BMI 23.16 kg/m2  SpO2 97% Physical Exam Gen: well developed and well nourished appearing Head: NCAT Eyes: PERL, EOMI Nose: no epistaixis or rhinorrhea Mouth/throat: mucosa is moist and pink Neck: supple, no stridor Lungs: CTA B, no wheezing, rhonchi or rales CV: RRR, no murmur, extremities  appear well perfused.  Abd: soft, notender, nondistended Back: no ttp, no cva ttp Skin: warm and dry Ext: normal to inspection, no dependent edema Neuro: CN ii-xii grossly intact, no focal deficits Psyche; normal affect,  calm and cooperative.   ED Course  Procedures (including critical care time) Labs Review  Results for orders placed during the hospital encounter of 12/12/13 (from the past 24 hour(s))  CBC     Status: Abnormal   Collection Time    12/12/13  2:33 AM      Result Value Ref Range   WBC 8.4  4.0 - 10.5 K/uL   RBC 3.53 (*) 4.22 - 5.81 MIL/uL   Hemoglobin 9.4 (*) 13.0 - 17.0 g/dL   HCT 29.3 (*) 39.0 - 52.0 %   MCV 83.0  78.0 - 100.0 fL   MCH 26.6  26.0 - 34.0 pg   MCHC 32.1  30.0 - 36.0 g/dL   RDW 18.9 (*) 11.5 - 15.5 %   Platelets 254  150 - 400 K/uL  BASIC METABOLIC PANEL     Status: Abnormal   Collection Time    12/12/13  2:33 AM      Result Value Ref Range   Sodium 137  137 - 147 mEq/L   Potassium 5.5 (*) 3.7 - 5.3 mEq/L   Chloride 99  96 - 112 mEq/L   CO2 21  19 - 32 mEq/L   Glucose, Bld 85  70 - 99 mg/dL   BUN 45 (*) 6 - 23 mg/dL   Creatinine, Ser 12.38 (*) 0.50 - 1.35 mg/dL   Calcium 9.2  8.4 - 10.5 mg/dL   GFR calc non Af Amer 4 (*) >90 mL/min   GFR calc Af Amer 5 (*) >90 mL/min  PRO B NATRIURETIC PEPTIDE     Status: Abnormal   Collection Time    12/12/13  2:33 AM      Result Value Ref Range   Pro B Natriuretic peptide (BNP) >70000.0 (*) 0 - 125 pg/mL  TROPONIN I     Status: None   Collection Time    12/12/13  2:33 AM      Result Value Ref Range   Troponin I <0.30  <0.30 ng/mL      Imaging Review Dg Chest 2 View  12/12/2013   CLINICAL DATA:  Mid chest pain and shortness of breath.  EXAM: CHEST  2 VIEW  COMPARISON:  Chest radiograph performed 12/05/2013  FINDINGS: The lungs are well-aerated. Vascular congestion is noted. Mild bilateral central opacities may reflect underlying chronic interstitial lung disease or recurrent interstitial edema.  No definite pleural effusion or pneumothorax is seen.  The heart is mildly enlarged. A right-sided  dual-lumen catheter is noted ending about the cavoatrial junction. No acute osseous abnormalities are seen.  IMPRESSION: Vascular congestion and mild cardiomegaly noted. Mild bilateral central airspace opacities reflect may reflect underlying chronic interstitial lung disease or recurrent interstitial edema.   Electronically Signed   By: Garald Balding M.D.   On: 12/12/2013 04:14     EKG Interpretation   Date/Time:  Sunday Dec 12 2013 02:48:52 EDT Ventricular Rate:  82 PR Interval:  170 QRS Duration: 102 QT Interval:  400 QTC Calculation: 467 R Axis:   -12 Text Interpretation:  Normal sinus rhythm Possible Left atrial enlargement  Left ventricular hypertrophy with repolarization abnormality ST elevation,  consider early repolarization, pericarditis, or injury Abnormal ECG  Similar to previous Confirmed by ZAVITZ  MD, JOSHUA ([id number]) on 12/12/2013  3:38:47 AM      MDM   Patient with self limited SOB and chest pain. Has been doing well in the ED without supplemental oxygen with sats in the 98 to 100% range and no respiratory distress. Patient is noted to be very mildly hyperkalemic at 5.5  No acute EKG changes. No pulmonary edema or infiltrate recognized on CXR. Patient has been mildly hypertensive throughout his ED stay. He is stable for discharge with plan to follow up at dialysis center tomorrow for H/D. Return precautions reviewed with the patient.    Elyn Peers, MD 12/12/13 [phone]

## 2013-12-12 NOTE — Discharge Instructions (Signed)
Dialysis Dialysis is a procedure that replaces some of the work healthy kidneys do. It is done when you lose about 85 90% of your kidney function. It may also be done earlier if your symptoms may be improved by dialysis. During dialysis, wastes, salt, and extra water are removed from the blood, and the levels of certain chemicals in the blood (such as potassium) are maintained. Dialysis is done in sessions. Dialysis sessions are continued until the kidneys get better. If the kidneys cannot get better, such as in end-stage kidney disease, dialysis is continued for life or until you receive a new kidney (kidney transplant). There are two types of dialysis: hemodialysis and peritoneal dialysis. HEMODIALYSIS  Hemodialysis is a type of dialysis in which a machine called a dialyzer is used to filter the blood. Before beginning hemodialysis, you will have surgery to create a site where blood can be removed from the body and returned to the body (vascular access). There are three types of vascular accesses:  Arteriovenous fistula. To create this type of access, an artery is connected to a vein (usually in the arm). A fistula takes 1 6 months to develop after surgery. If it develops properly, it usually lasts longer than the other types of vascular accesses. It is also less likely to become infected and cause blood clots.  Arteriovenous graft. To create this type of access, an artery and a vein in the arm are connected with a tube. A graft may be used within 2 3 weeks of surgery.  A venous catheter. To create this type of access, a thin, flexible tube (catheter) is placed in a large vein in your neck, chest, or groin. A catheter may be used right away. It is usually used as a temporary access when dialysis needs to begin immediately. During hemodialysis, blood leaves the body through your access. It travels through a tube to the dialyzer, where it is filtered. The blood then returns to your body through another  tube. Hemodialysis is usually performed by a caregiver at a hospital or dialysis center three times a week. Visits last about 3 4 hours. It may also be performed with the help of another person at home with training.  PERITONEAL DIALYSIS Peritoneal dialysis is a type of dialysis in which the thin lining of the abdomen (peritoneum) is used as a filter. Before beginning peritoneal dialysis, you will have surgery to place a catheter in your abdomen. The catheter will be used to transfer a fluid called dialysate to and from your abdomen. At the start of a session, your abdomen is filled with dialysate. During the session, wastes, salt, and extra water in the blood pass through the peritoneum and into the dialysate. The dialysate is drained from the body at the end of the session. The process of filling and draining the dialysate is called an exchange. Exchanges are repeated until you have used up all the dialysate for the day. Peritoneal dialysis may be performed by you at home or at almost any other location. It is done every day. You may need up to five exchanges a day. The amount of time the dialysate is in your body between exchanges is called a dwell. The dwell depends on the number of exchanges needed and the characteristics of the peritoneum. It usually varies from 1.5 3 hours. You may go about your day normally between exchanges. Alternately, the exchanges may be done at night while you sleep using a machine called a cycler. CHOOSING HEMODIALYSIS OR  PERITONEAL DIALYSIS  Both hemodialysis and peritoneal dialysis have advantages and disadvantages. Talk to your caregiver about which type of dialysis would be best for you. Your lifestyle and preferences should be considered along with your medical condition. In some cases, only one type of dialysis may be an option.  Advantages of hemodialysis  It is done less often than peritoneal dialysis.  Someone else can do the dialysis for you.  If you go to a  dialysis center, your caregiver will be able to recognize any problems right away.  If you go to a dialysis center, you can interact with others who are having dialysis. This can provide you with emotional support. Disadvantages of hemodialysis  Hemodialysis may cause cramps and low blood pressure. It may leave you feeling tired on the days you have the treatment.  If you go to a dialysis center, you will need to make weekly appointments and work around the center's schedule.  You will need to take extra care when traveling. If you go to a dialysis center, you will need to make special arrangements to visit a dialysis center near your destination. If you are having treatments at home, you will need to take the dialyzer with you to your destination.  You will need to avoid more foods than you would need to avoid on peritoneal dialysis. Advantages of peritoneal dialysis  It is less likely than hemodialysis to cause cramps and low blood pressure.  You may do exchanges on your own wherever you are, including when you travel.  You do not need to avoid as many foods as you do on hemodialysis. Disadvantages of Peritoneal Dialysis  It is done more often than hemodialysis.  Performing peritoneal dialysis requires you to have dexterity of the hands. You must also be able to lift bags.  You will have to learn sterilization techniques. You will need to practice them every day to reduce the risk of infection. DIALYSIS DIET Both hemodialysis and peritoneal dialysis require you to make some changes to your diet. For example, you will need to limit your intake of foods high in the minerals phosphorus and potassium. You will also need to limit your fluid intake. Your dietitian can help you plan meals. A good meal plan can improve your dialysis and your health.  WHAT TO EXPECT  Adjusting to the dialysis treatment, schedule, and diet can take some time. You may need to stop working and may not be able to  do some of the things you normally do. You may feel anxious or depressed when beginning dialysis. Eventually, many people feel better overall because of dialysis. Some people are able to return to work after making some changes, such as reducing work intensity. FOR MORE INFORMATION  National Kidney Foundation: www.kidney.org American Association of Kidney Patients: BombTimer.gl  American Kid[employer]: www.kidneyfund.org Document Released: 09/28/2002 Document Revised: 03/10/2013 Document Reviewed: 09/01/2012 [facility] Patient Information 2014 [place].

## 2013-12-12 NOTE — ED Notes (Signed)
EDP made aware of patient elevated K+

## 2013-12-13 DIAGNOSIS — D631 Anemia in chronic kidney disease: Secondary | ICD-10-CM

## 2013-12-13 DIAGNOSIS — M109 Gout, unspecified: Secondary | ICD-10-CM

## 2013-12-13 DIAGNOSIS — I10 Essential (primary) hypertension: Secondary | ICD-10-CM

## 2013-12-13 DIAGNOSIS — N2581 Secondary hyperparathyroidism of renal origin: Secondary | ICD-10-CM

## 2013-12-13 DIAGNOSIS — E875 Hyperkalemia: Principal | ICD-10-CM

## 2013-12-13 DIAGNOSIS — N039 Chronic nephritic syndrome with unspecified morphologic changes: Secondary | ICD-10-CM

## 2013-12-13 DIAGNOSIS — N186 End stage renal disease: Secondary | ICD-10-CM

## 2013-12-13 LAB — GLUCOSE, CAPILLARY
GLUCOSE-CAPILLARY: 45 mg/dL — AB (ref 70–99)
GLUCOSE-CAPILLARY: 61 mg/dL — AB (ref 70–99)
GLUCOSE-CAPILLARY: 123 mg/dL — AB (ref 70–99)
GLUCOSE-CAPILLARY: 86 mg/dL (ref 70–99)
Glucose-Capillary: 107 mg/dL — ABNORMAL HIGH (ref 70–99)

## 2013-12-13 LAB — BASIC METABOLIC PANEL
BUN: 23 mg/dL (ref 6–23)
CHLORIDE: 100 meq/L (ref 96–112)
CO2: 26 mEq/L (ref 19–32)
Calcium: 8.5 mg/dL (ref 8.4–10.5)
Creatinine, Ser: 7.02 mg/dL — ABNORMAL HIGH (ref 0.50–1.35)
GFR, EST AFRICAN AMERICAN: 10 mL/min — AB (ref >90)
GFR, EST NON AFRICAN AMERICAN: 8 mL/min — AB (ref >90)
Glucose, Bld: 122 mg/dL — ABNORMAL HIGH (ref 70–99)
POTASSIUM: 3.7 meq/L (ref 3.7–5.3)
SODIUM: 137 meq/L (ref 137–147)

## 2013-12-13 LAB — CBC
HEMATOCRIT: 27.2 % — AB (ref 39.0–52.0)
HEMOGLOBIN: 9 g/dL — AB (ref 13.0–17.0)
MCH: 27.3 pg (ref 26.0–34.0)
MCHC: 33.1 g/dL (ref 30.0–36.0)
MCV: 82.4 fL (ref 78.0–100.0)
Platelets: 216 10*3/uL (ref 150–400)
RBC: 3.3 MIL/uL — AB (ref 4.22–5.81)
RDW: 18.6 % — ABNORMAL HIGH (ref 11.5–15.5)
WBC: 6.8 10*3/uL (ref 4.0–10.5)

## 2013-12-13 LAB — TROPONIN I: Troponin I: <0.3 ng/mL (ref <0.30)

## 2013-12-13 LAB — HEPATITIS B SURFACE ANTIGEN: Hepatitis B Surface Ag: NEGATIVE

## 2013-12-13 MED ORDER — DEXTROSE 50 % IV SOLN
INTRAVENOUS | Status: AC
  Administered 2013-12-13: 50 mL
  Filled 2013-12-13: qty 50

## 2013-12-13 NOTE — Progress Notes (Signed)
Subjective: Interval History: has no complaint, breathing better  Objective: Vital signs in last 24 hours: Temp:  [97.6 F (36.4 C)-98.2 F (36.8 C)] 97.6 F (36.4 C) (05/25 0325) Pulse Rate:  [60-91] 77 (05/25 0936) Resp:  [12-21] 21 (05/25 0325) BP: (153-202)/(92-133) 168/113 mmHg (05/25 0936) SpO2:  [96 %-100 %] 100 % (05/25 0325) Weight:  [74.2 kg (163 lb 9.3 oz)-80.831 kg (178 lb 3.2 oz)] 74.2 kg (163 lb 9.3 oz) (05/25 0307) Weight change:   Intake/Output from previous day: 05/24 0701 - 05/25 0700 In: 350 [P.O.:240; I.V.:110] Out: -  Intake/Output this shift: Total I/O In: 240 [P.O.:240] Out: -   General appearance: alert, cooperative and no distress Resp: diminished breath sounds bilaterally and rales bibasilar Chest wall: R IJ cath Cardio: S1, S2 normal and systolic murmur: holosystolic 2/6, blowing at apex GI: pos bs, liver down 5 cm, soft Extremities: edema 1+  Lab Results:  Recent Labs  12/12/13 1623 12/13/13 0400  WBC 6.0 6.8  HGB 8.8* 9.0*  HCT 28.4* 27.2*  PLT 221 216   BMET:  Recent Labs  12/12/13 1940 12/13/13 0400  NA 135* 137  K 6.8* 3.7  CL 99 100  CO2 18* 26  GLUCOSE 101* 122*  BUN 53* 23  CREATININE 13.51* 7.02*  CALCIUM 9.5 8.5   No results found for this basename: PTH,  in the last 72 hours Iron Studies: No results found for this basename: IRON, TIBC, TRANSFERRIN, FERRITIN,  in the last 72 hours  Studies/Results: Dg Chest 2 View  12/12/2013   CLINICAL DATA:  Mid chest pain and shortness of breath.  EXAM: CHEST  2 VIEW  COMPARISON:  Chest radiograph performed 12/05/2013  FINDINGS: The lungs are well-aerated. Vascular congestion is noted. Mild bilateral central opacities may reflect underlying chronic interstitial lung disease or recurrent interstitial edema. No definite pleural effusion or pneumothorax is seen.  The heart is mildly enlarged. A right-sided dual-lumen catheter is noted ending about the cavoatrial junction. No acute  osseous abnormalities are seen.  IMPRESSION: Vascular congestion and mild cardiomegaly noted. Mild bilateral central airspace opacities reflect may reflect underlying chronic interstitial lung disease or recurrent interstitial edema.   Electronically Signed   By: Garald Balding M.D.   On: 12/12/2013 04:14    I have reviewed the patient's current medications.  Assessment/Plan: 1  ESRD some vol xs , not emergent.  Can go to his home unit if they can take. Or can be done tomorrow 2 HTN vol xs and needs meds 3 Anemia 4 HPTH 5 Nonadherence P D/c to home unit for HD    LOS: 1 day   Arlanda Shiplett L Wylee Ogden 12/13/2013,10:46 AM

## 2013-12-13 NOTE — Progress Notes (Signed)
Pt d/c to home, pt given d/c instructions and follow up appointments, pt stable, pt walked to his car

## 2013-12-13 NOTE — Progress Notes (Signed)
Pt back from dialysis, asleep via bed. BG=61, pt offered juice but refused c/o being nauseous. Kathline Magic NP notified and ordered to give an amp of D50 as part of hypoglycemia protocol. BP also high while in dialysis ( please refer to Doc Flowsheet), Fredirick Maudlin NP ordered to give BP meds that was scheduled before pt went to dialysis, orders carried out. Will continue to monitor. Filed Vitals:   12/13/13 0325  BP: 161/95  Pulse: 77  Temp: 97.6 F (36.4 C)  Resp: 21    Purvis Kilts, RN

## 2013-12-13 NOTE — Progress Notes (Signed)
Hypoglycemic Event  CBG:61  Treatment: 1 amp of D50  Symptoms: nauseated  Follow-up CBG: Time:0410 CBG Result:123  Possible Reasons for Event: Pt given 10 units of novolog as part of hyperkalemia protocol  Comments/MD notified:Tom Rogue Bussing NP    Purvis Kilts  Remember to initiate Hypoglycemia Order Set & complete

## 2013-12-13 NOTE — Discharge Summary (Signed)
Physician Discharge Summary  Frank [name] FAO:[id number] DOB: Nov 20, 1965 DOA: 12/12/2013  PCP: Adin Hector  Admit date: 12/12/2013 Discharge date: 12/13/2013  Time spent: >30 minutes  Recommendations for Outpatient Follow-up:  Renal function panel to her about weight electrolytes and also creatinine/BUN Reassess blood pressure and adjust medication regimen as needed.  Discharge Diagnoses:  Chest pain and shortness of breath Pulmonary edema Hyperkalemia End-stage renal disease on hemodialysis Anemia of chronic disease Hypertension Medication noncompliance   Discharge Condition: Stable and improved. Patient denies any shortness of breath, chest pain or any other acute complaints. He will be discharged home with instructions to be compliant with medication and to follow with his hemodialysis unit for a scheduled treatments.  Diet recommendation: Low-sodium/renal diet  Filed Weights   12/12/13 2002 12/12/13 2300 12/13/13 0307  Weight: 80.831 kg (178 lb 3.2 oz) 78.7 kg (173 lb 8 oz) 74.2 kg (163 lb 9.3 oz)    History of present illness:  48 year old male with ESRD on HD TTS, on immunosuppresants, uncontrolled HTN, depression, presented to [facility] ED with main concern of several days duration of substernal chest pressure, intermittent, 5/10 in severity, seen earlier in ED and sent home after work up was negative only to return with worsening discomfort. Please note that pt is somewhat poor historian and initially said he had HD yesterday but on other occasion said he actually ski[[ed last several sessions. Pt denies any specific alleviating or aggravating factors, believes he is volume overloaded and over 10 lbs above his dry weight. He also reports exertional dyspnea as well as dyspnea at rest, orthopnea and LE swelling. He denies fevers, chills, no specific abdominal concerns.  In ED, pt hemodynamically stable, BMP significant for K 7.7, on exam pt volume overloaded and TRH asked to  admit for further evaluation and ? Need for urgent HD. Nephrology team consulted.   Hospital Course:  Chest pain  and shortness of breath - in the setting of fluid overload and pulmonary edema - Negative troponins x4, no acute ischemic changes on EKG or telemetry.  -Patient received hemodialysis overnight with complete resolution of her chest pain and shortness of breath.   ESRD on HD  - nephrology consulted and hemodialysis provided on 12/13/1998. -Patient after hemodialysis treatment, has complete resolution of shortness of breath and his electrolytes are now within normal limits. -After discussing with patient and renal service and is to let the patient go home and resume his hemodialysis treatment in the outpatient setting (patient hemodialysis schedule Tuesday-Thursday-Saturday)   Hyperkalemia  - received one dose of kayexalate and calcium gluconate in ED  - Hemodialysis on 12/12/2013 -At discharge electrolytes within normal limits (potassium 3.7) -Lisinopril has been discontinue  HTN, accelerated  - continue home medical regimen Norvasc, Clonidine, Labetalol, Lasix - Blood pressure component control with hemodialysis therapy. -Patient advised to follow a low-sodium/renal diet  Medical non compliance  -Patient advises last encouraged to be compliant with his medication and also with his hemodialysis treatment to prevent any further derangements of his health status.  Anemia of chronic renal disease  - no signs of active bleeding  - IV iron and aranesp therapy to be determined by renal service as an outpatient. -At discharge hemoglobin 9.0   Procedures:  See below for x-ray reports  Consultations:  Renal service  Discharge Exam: Filed Vitals:   12/13/13 0936  BP: 168/113  Pulse: 77  Temp:   Resp:    General appearance: alert, cooperative and no distress  Resp:  diminished breath sounds bilaterally and scattered rhonchi; otherwise clear to auscultation.  Chest  wall: R IJ cath he received hemodialysis through Cardio: S1, S2 normal and systolic murmur: holosystolic 2/6, blowing at apex  GI: pos bs, liver down 5 cm, soft  Extremities: edema 1+    Discharge Instructions You were cared for by a hospitalist during your hospital stay. If you have any questions about your discharge medications or the care you received while you were in the hospital after you are discharged, you can call the unit and asked to speak with the hospitalist on call if the hospitalist that took care of you is not available. Once you are discharged, your primary care physician will handle any further medical issues. Please note that NO REFILLS for any discharge medications will be authorized once you are discharged, as it is imperative that you return to your primary care physician (or establish a relationship with a primary care physician if you do not have one) for your aftercare needs so that they can reassess your need for medications and monitor your lab values.  Discharge Instructions   Diet - low sodium heart healthy    Complete by:  As directed      Discharge instructions    Complete by:  As directed   Take medications as prescribed. Do not miss any of your outpatient hemodialysis appointments Follow a low-sodium/renal diet Arrange followup with her primary care doctor in one week.            Medication List    STOP taking these medications       lisinopril 10 MG tablet  Commonly known as:  PRINIVIL,ZESTRIL      TAKE these medications       allopurinol 100 MG tablet  Commonly known as:  ZYLOPRIM  Take 1 tablet (100 mg total) by mouth daily.     amLODipine 5 MG tablet  Commonly known as:  NORVASC  Take 10 mg by mouth daily.     cinacalcet 30 MG tablet  Commonly known as:  SENSIPAR  Take 30 mg by mouth daily.     cloNIDine 0.3 MG tablet  Commonly known as:  CATAPRES  Take 1 tablet (0.3 mg total) by mouth 3 (three) times daily.     colchicine 0.6 MG  tablet  Take 0.6 mg by mouth daily as needed (for gout).     furosemide 80 MG tablet  Commonly known as:  LASIX  Take 80 mg by mouth daily.     labetalol 200 MG tablet  Commonly known as:  NORMODYNE  Take 200 mg by mouth 2 (two) times daily.     minoxidil 2.5 MG tablet  Commonly known as:  LONITEN  Take 2.5 mg by mouth daily.     omeprazole 20 MG capsule  Commonly known as:  PRILOSEC  Take 20 mg by mouth daily.     ondansetron 8 MG disintegrating tablet  Commonly known as:  ZOFRAN ODT  Take 1 tablet (8 mg total) by mouth every 8 (eight) hours as needed for nausea or vomiting.     predniSONE 5 MG tablet  Commonly known as:  DELTASONE  Take 15 mg by mouth daily with breakfast.     tacrolimus 5 MG capsule  Commonly known as:  PROGRAF  Take 10 mg by mouth 2 (two) times daily.     valGANciclovir 450 MG tablet  Commonly known as:  VALCYTE  Take 450 mg by mouth every Monday,  Wednesday, and Friday.       Allergies  Allergen Reactions  . Ferrlecit [Na Ferric Gluc Cplx In Sucrose] Shortness Of Breath, Swelling and Other (See Comments)    Swelling in throat  . Darvocet [Propoxyphene N-Acetaminophen] Hives       Follow-up Information   Follow up with Adin Hector. Schedule an appointment as soon as possible for a visit in 1 week.   Specialty:  General Practice      The results of significant diagnostics from this hospitalization (including imaging, microbiology, ancillary and laboratory) are listed below for reference.    Significant Diagnostic Studies: Dg Chest 2 View  12/12/2013   CLINICAL DATA:  Mid chest pain and shortness of breath.  EXAM: CHEST  2 VIEW  COMPARISON:  Chest radiograph performed 12/05/2013  FINDINGS: The lungs are well-aerated. Vascular congestion is noted. Mild bilateral central opacities may reflect underlying chronic interstitial lung disease or recurrent interstitial edema. No definite pleural effusion or pneumothorax is seen.  The heart is  mildly enlarged. A right-sided dual-lumen catheter is noted ending about the cavoatrial junction. No acute osseous abnormalities are seen.  IMPRESSION: Vascular congestion and mild cardiomegaly noted. Mild bilateral central airspace opacities reflect may reflect underlying chronic interstitial lung disease or recurrent interstitial edema.   Electronically Signed   By: Garald Balding M.D.   On: 12/12/2013 04:14   Dg Chest 2 View  12/05/2013   CLINICAL DATA:  Vomiting and shortness of breath.  EXAM: CHEST  2 VIEW  COMPARISON:  10/30/2013.  FINDINGS: Stable enlarged cardiac silhouette and right jugular catheter. No significant change in diffuse peribronchial thickening and accentuation of the interstitial markings. Unremarkable bones.  IMPRESSION: Stable cardiomegaly, chronic bronchitic changes and chronic interstitial lung disease.   Electronically Signed   By: Enrique Sack M.D.   On: 12/05/2013 03:39   Dg Abd 1 View  12/05/2013   CLINICAL DATA:  Vomiting for 3 days  EXAM: ABDOMEN - 1 VIEW  COMPARISON:  None.  FINDINGS: There is moderate stool in the colon. The bowel gas pattern is unremarkable. No obstruction or free air is seen on this supine examination. There are no abnormal calcifications.  IMPRESSION: Overall bowel gas pattern unremarkable.  Moderate stool in colon.   Electronically Signed   By: Lowella Grip M.D.   On: 12/05/2013 08:11   Labs: Basic Metabolic Panel:  Recent Labs Lab 12/12/13 0233 12/12/13 1623 12/12/13 1758 12/12/13 1940 12/13/13 0400  NA 137 133*  --  135* 137  K 5.5* 7.7* 7.1* 6.8* 3.7  CL 99 99  --  99 100  CO2 21 21  --  18* 26  GLUCOSE 85 116*  --  101* 122*  BUN 45* 54*  --  53* 23  CREATININE 12.38* 13.40*  --  13.51* 7.02*  CALCIUM 9.2 9.2  --  9.5 8.5   CBC:  Recent Labs Lab 12/12/13 0233 12/12/13 1623 12/13/13 0400  WBC 8.4 6.0 6.8  HGB 9.4* 8.8* 9.0*  HCT 29.3* 28.4* 27.2*  MCV 83.0 83.0 82.4  PLT 254 221 216   Cardiac Enzymes:  Recent  Labs Lab 12/12/13 0233 12/12/13 1641 12/12/13 2223 12/13/13 0400 12/13/13 0830  TROPONINI <0.30 <0.30 <0.30 <0.30 <0.30   BNP: BNP (last 3 results)  Recent Labs  10/17/13 1023 12/12/13 0233 12/12/13 1641  PROBNP >70000.0* >70000.0* >70000.0*   CBG:  Recent Labs Lab 12/13/13 0113 12/13/13 0317 12/13/13 0410 12/13/13 0608 12/13/13 1153  GLUCAP 45* 61*  123* 86 107*    Signed:  Barton Dubois  [facility] 12/13/2013, 1:01 PM

## 2013-12-13 NOTE — Progress Notes (Signed)
Admitted pt to rm 3E30 from ED, oriented to room, call bell placed within reach, orders carried out. Will continue to monitor.

## 2013-12-14 NOTE — Progress Notes (Signed)
Utilization Review Completed. Deloria Brassfield J. Clydene Laming, [place], [place], [employer] [phone].

## 2013-12-15 NOTE — ED Provider Notes (Signed)
Medical screening examination/treatment/procedure(s) were performed by non-physician practitioner and as supervising physician I was immediately available for consultation/collaboration.   EKG Interpretation   Date/Time:  Sunday Dec 12 2013 15:59:32 EDT Ventricular Rate:  68 PR Interval:  178 QRS Duration: 82 QT Interval:  448 QTC Calculation: 476 R Axis:   -43 Text Interpretation:  Normal sinus rhythm Possible Left atrial enlargement  Left axis deviation Left ventricular hypertrophy with repolarization  abnormality Abnormal ECG Confirmed by Wilson Singer  MD, Rony Ratz ([id number]) on  12/12/2013 5:47:20 PM       Virgel Manifold, MD 12/15/13 [phone]

## 2013-12-21 ENCOUNTER — Emergency Department (HOSPITAL_COMMUNITY): Payer: Medicare Other

## 2013-12-21 ENCOUNTER — Encounter (HOSPITAL_COMMUNITY): Payer: Self-pay | Admitting: Emergency Medicine

## 2013-12-21 ENCOUNTER — Emergency Department (HOSPITAL_COMMUNITY)
Admission: EM | Admit: 2013-12-21 | Discharge: 2013-12-21 | Disposition: A | Discharge status: Home / Self Care | Payer: Medicare Other | Attending: Emergency Medicine | Admitting: Emergency Medicine

## 2013-12-21 DIAGNOSIS — E875 Hyperkalemia: Secondary | ICD-10-CM | POA: Insufficient documentation

## 2013-12-21 DIAGNOSIS — Z79899 Other long term (current) drug therapy: Secondary | ICD-10-CM | POA: Insufficient documentation

## 2013-12-21 DIAGNOSIS — F3289 Other specified depressive episodes: Secondary | ICD-10-CM | POA: Insufficient documentation

## 2013-12-21 DIAGNOSIS — I120 Hypertensive chronic kidney disease with stage 5 chronic kidney disease or end stage renal disease: Secondary | ICD-10-CM | POA: Insufficient documentation

## 2013-12-21 DIAGNOSIS — IMO0002 Reserved for concepts with insufficient information to code with codable children: Secondary | ICD-10-CM | POA: Insufficient documentation

## 2013-12-21 DIAGNOSIS — K529 Noninfective gastroenteritis and colitis, unspecified: Secondary | ICD-10-CM

## 2013-12-21 DIAGNOSIS — N186 End stage renal disease: Secondary | ICD-10-CM | POA: Insufficient documentation

## 2013-12-21 DIAGNOSIS — F411 Generalized anxiety disorder: Secondary | ICD-10-CM | POA: Insufficient documentation

## 2013-12-21 DIAGNOSIS — Z87891 Personal history of nicotine dependence: Secondary | ICD-10-CM | POA: Insufficient documentation

## 2013-12-21 DIAGNOSIS — N2889 Other specified disorders of kidney and ureter: Secondary | ICD-10-CM

## 2013-12-21 DIAGNOSIS — E119 Type 2 diabetes mellitus without complications: Secondary | ICD-10-CM | POA: Insufficient documentation

## 2013-12-21 DIAGNOSIS — N289 Disorder of kidney and ureter, unspecified: Secondary | ICD-10-CM | POA: Insufficient documentation

## 2013-12-21 DIAGNOSIS — K5289 Other specified noninfective gastroenteritis and colitis: Secondary | ICD-10-CM | POA: Insufficient documentation

## 2013-12-21 DIAGNOSIS — F329 Major depressive disorder, single episode, unspecified: Secondary | ICD-10-CM | POA: Insufficient documentation

## 2013-12-21 DIAGNOSIS — Z992 Dependence on renal dialysis: Secondary | ICD-10-CM | POA: Insufficient documentation

## 2013-12-21 LAB — BASIC METABOLIC PANEL
BUN: 91 mg/dL — ABNORMAL HIGH (ref 6–23)
CALCIUM: 8.4 mg/dL (ref 8.4–10.5)
CO2: 15 meq/L — AB (ref 19–32)
CREATININE: 17.69 mg/dL — AB (ref 0.50–1.35)
Chloride: 100 mEq/L (ref 96–112)
GFR calc Af Amer: 3 mL/min — ABNORMAL LOW (ref >90)
GFR calc non Af Amer: 3 mL/min — ABNORMAL LOW (ref >90)
Glucose, Bld: 98 mg/dL (ref 70–99)
Potassium: 6.6 mEq/L (ref 3.7–5.3)
SODIUM: 137 meq/L (ref 137–147)

## 2013-12-21 MED ORDER — ONDANSETRON 8 MG PO TBDP: 8.0000 mg | ORAL_TABLET | Freq: Three times a day (TID) | ORAL | Status: DC | PRN

## 2013-12-21 MED ORDER — SODIUM POLYSTYRENE SULFONATE 15 GM/60ML PO SUSP
30.0000 g | Freq: Once | ORAL | Status: AC
  Administered 2013-12-21: 30 g via ORAL
  Filled 2013-12-21: qty 120

## 2013-12-21 MED ORDER — METRONIDAZOLE 500 MG PO TABS
500.0000 mg | ORAL_TABLET | Freq: Once | ORAL | Status: AC
  Administered 2013-12-21: 500 mg via ORAL
  Filled 2013-12-21: qty 1

## 2013-12-21 MED ORDER — CIPROFLOXACIN HCL 500 MG PO TABS: 500.0000 mg | ORAL_TABLET | Freq: Two times a day (BID) | ORAL | Status: DC

## 2013-12-21 MED ORDER — CIPROFLOXACIN HCL 500 MG PO TABS
500.0000 mg | ORAL_TABLET | Freq: Once | ORAL | Status: AC
  Administered 2013-12-21: 500 mg via ORAL
  Filled 2013-12-21: qty 1

## 2013-12-21 MED ORDER — PROMETHAZINE HCL 25 MG PO TABS: 25.0000 mg | ORAL_TABLET | Freq: Four times a day (QID) | ORAL | Status: DC | PRN

## 2013-12-21 MED ORDER — METRONIDAZOLE 500 MG PO TABS: 500.0000 mg | ORAL_TABLET | Freq: Two times a day (BID) | ORAL | Status: DC

## 2013-12-21 MED ORDER — TRAMADOL HCL 50 MG PO TABS: 50.0000 mg | ORAL_TABLET | Freq: Four times a day (QID) | ORAL | Status: DC | PRN

## 2013-12-21 MED ORDER — FENTANYL CITRATE 0.05 MG/ML IJ SOLN
50.0000 ug | Freq: Once | INTRAMUSCULAR | Status: AC
  Administered 2013-12-21: 50 ug via INTRAVENOUS
  Filled 2013-12-21: qty 2

## 2013-12-21 MED ORDER — PROMETHAZINE HCL 25 MG/ML IJ SOLN
25.0000 mg | Freq: Once | INTRAMUSCULAR | Status: AC
  Administered 2013-12-21: 25 mg via INTRAVENOUS
  Filled 2013-12-21: qty 1

## 2013-12-21 NOTE — ED Notes (Signed)
Patient with history of high potassium, patient with abdominal pain with abdominal swelling.  Patient states that he is a dialysis patient, he goes on [facility].  Patient is CAOx3 in triage.  Patient states he had blood drawn and dialysis center called him to come to ED to get potassium down before going to dialysis.

## 2013-12-21 NOTE — ED Provider Notes (Signed)
CSN: [id number]     Arrival date & time 12/21/13  0034 History   First MD Initiated Contact with Patient 12/21/13 [phone]     Chief Complaint  Patient presents with  . Abnormal Lab     (Consider location/radiation/quality/duration/timing/severity/associated sxs/prior Treatment) HPI 48 year old male presents to the emergency department from home with complaint of abdominal pain.  He is concerned that he has high potassium as the past when he has abdominal swelling and/or pain is been due to high potassium.  He also reports nausea but no vomiting.  He denies any fever or chills.  He reports that he is slightly constipated.  A small bowel movement at 11 PM.  Patient is status post a renal transplant, no other abdominal surgeries.  He is compliant with his medications.  He is due for dialysis today. Past Medical History  Diagnosis Date  . Hypertension   . ESRD (end stage renal disease)     due to HTN per patient, followed at [facility], s/p failed kidney transplant  . Depression   . Complication of anesthesia     itching, sore throat  . Diabetes mellitus without complication     No history per patient, but remains under history as A1c would not be accurate given on dialysis  . Shortness of breath   . Anxiety    Past Surgical History  Procedure Laterality Date  . Kidney receipient  2006    failed and started HD in March 2014  . Capd insertion    . Capd removal     No family history on file. History  Substance Use Topics  . Smoking status: Former Smoker -- 1.00 packs/day for 1 years    Types: Cigarettes  . Smokeless tobacco: Never Used     Comment: quit Jan 2014  . Alcohol Use: No    Review of Systems  See History of Present Illness; otherwise all other systems are reviewed and negative   Allergies  Ferrlecit and Darvocet  Home Medications   Prior to Admission medications   Medication Sig Start Date End Date Taking? Authorizing Provider  allopurinol (ZYLOPRIM) 100 MG tablet  Take 1 tablet (100 mg total) by mouth daily. 04/21/13  Yes [name], MD  amLODipine (NORVASC) 5 MG tablet Take 10 mg by mouth daily.   Yes Historical Provider, MD  cinacalcet (SENSIPAR) 30 MG tablet Take 30 mg by mouth daily.   Yes Historical Provider, MD  cloNIDine (CATAPRES) 0.3 MG tablet Take 1 tablet (0.3 mg total) by mouth 3 (three) times daily. 10/18/13  Yes [name], MD  colchicine 0.6 MG tablet Take 0.6 mg by mouth daily as needed (for gout). 04/13/13  Yes [name], MD  furosemide (LASIX) 80 MG tablet Take 80 mg by mouth daily.   Yes Historical Provider, MD  labetalol (NORMODYNE) 200 MG tablet Take 200 mg by mouth 2 (two) times daily.    Yes Historical Provider, MD  lisinopril (PRINIVIL,ZESTRIL) 20 MG tablet Take 20 mg by mouth daily.   Yes Historical Provider, MD  minoxidil (LONITEN) 2.5 MG tablet Take 2.5 mg by mouth daily.  10/12/13  Yes Historical Provider, MD  omeprazole (PRILOSEC) 20 MG capsule Take 20 mg by mouth daily. 07/01/13  Yes Historical Provider, MD  ondansetron (ZOFRAN ODT) 8 MG disintegrating tablet Take 1 tablet (8 mg total) by mouth every 8 (eight) hours as needed for nausea or vomiting. 12/05/13  Yes Dorie Rank, MD  predniSONE (DELTASONE) 5 MG tablet  Take 15 mg by mouth daily with breakfast.   Yes Historical Provider, MD  tacrolimus (PROGRAF) 5 MG capsule Take 10 mg by mouth 2 (two) times daily.    Yes Historical Provider, MD  valGANciclovir (VALCYTE) 450 MG tablet Take 450 mg by mouth every Monday, Wednesday, and Friday.   Yes Historical Provider, MD  ciprofloxacin (CIPRO) 500 MG tablet Take 1 tablet (500 mg total) by mouth 2 (two) times daily. 12/21/13   Kalman Drape, MD  metroNIDAZOLE (FLAGYL) 500 MG tablet Take 1 tablet (500 mg total) by mouth 2 (two) times daily. 12/21/13   Kalman Drape, MD  ondansetron (ZOFRAN ODT) 8 MG disintegrating tablet Take 1 tablet (8 mg total) by mouth every 8 (eight) hours as needed for nausea or vomiting. 12/21/13   Kalman Drape, MD   promethazine (PHENERGAN) 25 MG tablet Take 1 tablet (25 mg total) by mouth every 6 (six) hours as needed for nausea. 12/21/13   Kalman Drape, MD  traMADol (ULTRAM) 50 MG tablet Take 1 tablet (50 mg total) by mouth every 6 (six) hours as needed. 12/21/13   Kalman Drape, MD   BP 158/117  Pulse 60  Temp(Src) 97.9 F (36.6 C) (Oral)  Resp 18  SpO2 98% Physical Exam  Nursing note and vitals reviewed. Constitutional: He is oriented to person, place, and time. He appears well-developed and well-nourished. He appears distressed (uncomfortable appearing).  HENT:  Head: Normocephalic and atraumatic.  Nose: Nose normal.  Mouth/Throat: Oropharynx is clear and moist.  Eyes: Conjunctivae and EOM are normal. Pupils are equal, round, and reactive to light.  Neck: Normal range of motion. Neck supple. No JVD present. No tracheal deviation present. No thyromegaly present.  Cardiovascular: Normal rate, regular rhythm, normal heart sounds and intact distal pulses.  Exam reveals no gallop and no friction rub.   No murmur heard. Pulmonary/Chest: Effort normal and breath sounds normal. No stridor. No respiratory distress. He has no wheezes. He has no rales. He exhibits no tenderness.  Abdominal: Soft. Bowel sounds are normal. He exhibits distension (mild distention). He exhibits no mass. There is tenderness (diffuse tenderness worse in the left upper quadrant and left lower). There is no rebound and no guarding.  Musculoskeletal: Normal range of motion. He exhibits no edema and no tenderness.  Lymphadenopathy:    He has no cervical adenopathy.  Neurological: He is alert and oriented to person, place, and time. He exhibits normal muscle tone. Coordination normal.  Skin: Skin is warm and dry. No rash noted. No erythema. No pallor.  Psychiatric: He has a normal mood and affect. His behavior is normal. Judgment and thought content normal.    ED Course  Procedures (including critical care time) Labs Review Labs  Reviewed  BASIC METABOLIC PANEL - Abnormal; Notable for the following:    Potassium 6.6 (*)    CO2 15 (*)    BUN 91 (*)    Creatinine, Ser 17.69 (*)    GFR calc non Af Amer 3 (*)    GFR calc Af Amer 3 (*)    All other components within normal limits    Imaging Review Ct Abdomen Pelvis Wo Contrast  12/21/2013   CLINICAL DATA:  Abnormal labs.  EXAM: CT ABDOMEN AND PELVIS WITHOUT CONTRAST  TECHNIQUE: Multidetector CT imaging of the abdomen and pelvis was performed following the standard protocol without IV contrast.  COMPARISON:  10/31/2005  FINDINGS: BODY WALL: Unremarkable.  LOWER CHEST: Cardiomegaly and trace pericardial effusion.  ABDOMEN/PELVIS:  Liver: No focal abnormality.  Biliary: Circumferential low-density gallbladder wall thickening which is likely reactive to the patient's presumed volume overload. No distention to suggest gallbladder obstruction.  Pancreas: Unremarkable.  Spleen: Unremarkable.  Adrenals: Unremarkable.  Kidneys and ureters: Atrophic native kidneys. There is a new soft tissue density mass in the interpolar left kidney measuring 2.3 cm. No hydronephrosis. There is a renal transplant in the left iliac fossa. The transplant is presumably non functional given history of current dialysis. Small low-attenuation area in anterior to lower pole region could reflect cyst or site of previous biopsy. Linear high density along the lateral and anterior aspect of the kidney stable from previous and considered postsurgical. Stable mild pelviectasis. No overt hydronephrosis.  Bladder: Minimal urine internally.  Reproductive: Unremarkable.  Bowel: There is a segment of proximal small bowel which has circumferential thickening, fold thickening, and surrounding mesenteric fat haziness.  Retroperitoneum: Diffuse retroperitoneal fluid, likely from the volume overload. There is a rounded mass at the level of the left renal hilum at 17 mm. This was not seen previously; it is presumably a lymph node.   Peritoneum: .Small volume ascites distributed throughout the abdomen.  Vascular: No acute abnormality.  OSSEOUS: No acute abnormalities.  IMPRESSION: 1. Proximal enteritis which is usually infectious, but could be autoimmune or vascular. 2. 2.3 cm mass in the native left kidney. Renal cell carcinoma is the diagnosis of exclusion, recommend outpatient noncontrast renal MRI. 3. Left periaortic adenopathy, significance to be determined based on workup of #2.   Electronically Signed   By: Jorje Guild M.D.   On: 12/21/2013 04:54   Dg Chest 2 View  12/21/2013   CLINICAL DATA:  Chest pain.  Abdominal pain.  EXAM: CHEST  2 VIEW  COMPARISON:  12/12/2013  FINDINGS: Right IJ dialysis catheter in stable position, tip at the upper right atrial level. Unchanged cardiomegaly. Stable upper mediastinal contours. Pulmonary venous congestion without edema, consolidation, effusion, or pneumothorax.  IMPRESSION: Chronic cardiomegaly and venous congestion.   Electronically Signed   By: Jorje Guild M.D.   On: 12/21/2013 02:54     EKG Interpretation   Date/Time:  Tuesday December 21 2013 01:07:46 EDT Ventricular Rate:  72 PR Interval:  169 QRS Duration: 90 QT Interval:  442 QTC Calculation: 484 R Axis:   -47 Text Interpretation:  Sinus rhythm Left anterior fascicular block  Borderline T wave abnormalities Borderline prolonged QT interval No  significant change since last tracing Confirmed by Jebadiah Imperato  MD, Allsion Nogales ([id number])  on 12/21/2013 1:50:54 AM      MDM   Final diagnoses:  Enteritis  Hyperkalemia  ESRD (end stage renal disease)  Left kidney mass    48 year old male with moderate hyperkalemia and abdominal pain.  No peaked T waves or EKG changes noted.  Given prior abdominal surgeries and difficulties with bowel movement concern for possible bowel obstruction.  CT scan shows enteritis, mass in left kidney.  Results discussed with the patient.  He was instructed he may to followup with his primary care Dr. to  have outpatient MRI.  Patient will discuss this with his kidney doctor.  He has no questions at this time.  Patient instructed that he must go to dialysis today in order to get his potassium down appropriately.    Kalman Drape, MD 12/21/13 [phone]

## 2013-12-21 NOTE — ED Notes (Signed)
Pt. States "the whole reason I came over here was to get the medicine through the IV".  Explained to patient that dialysis would bring down his potassium level, pt stated "A four hour session won't do that". Pt. Also states that he does not want to take kayexalate due to needing to use the restroom while at dialysis.

## 2013-12-21 NOTE — ED Notes (Signed)
PT. Returned from xray

## 2013-12-21 NOTE — ED Notes (Signed)
Pt. Reports his potassium is high and called his dialysis center and they told him to come here. Reports abdominal distention today. States this happened last time his potassium was high.

## 2013-12-21 NOTE — Discharge Instructions (Signed)
Please proceed to dialysis today to have your normal dialysis completed.  Your CT scan today showed inflammation in your intestines. Take antibiotics as prescribed.  Return to the ER for fevers, vomiting, worsening pain or other concerning symptoms.  Please see your doctor for MRI as discussed   Hemodialysis Dialysis is a procedure that replaces some of the work that healthy kidneys do. It is done when you lose about 85 90% of your kidney function and sometimes earlier if your symptoms may be improved by dialysis. During dialysis, wastes, salt, and extra water are removed from the blood; and the level of certain chemicals in the blood (such as potassium) is maintained. Hemodialysis is a type of dialysis in which a machine called a dialyzer is used to filter the blood. Hemodialysis is usually performed by a caregiver at a hospital or dialysis center 3 times a week for 3 5 hours during each visit. It may also be performed more frequently and for longer periods of time at home with training. LET YOUR CAREGIVER KNOW ABOUT:  Any allergies you have.  All medications you are taking, including vitamins, herbs, eyedrops, and over-the-counter medications and creams.  Any blood disorders you have had.  Other health problems you have. RISKS AND COMPLICATIONS Generally, hemodialysis is a safe procedure. However, as with any procedure, complications can occur. Possible complications include:  Low blood pressure (hypotension).  Narrowing or ballooning of a blood vessel or bleeding at the site where blood is removed from the body and returned to the body (vascular access).  An infection or blockage at the vascular access site. BEFORE THE PROCEDURE Before having hemodialysis for the first time, you will need surgery to create a site where blood can be removed from the body and returned to the body (vascular access). There are three types of vascular accesses:  Arteriovenous fistula. This type of access is  created when an artery and a vein (usually in the arm) are connected during surgery. The arteriovenous fistula takes 1 6 months to develop after surgery.  Arteriovenous graft. This type of access is created when an artery and a vein in the arm are connected during surgery with a tube. An arteriovenous graft can be used within 2 3 weeks of surgery.  A venous catheter. To create this type of access, a thin, flexible tube (catheter) is placed in a large vein in your neck, chest, or groin. A venous catheter can be used right away. PROCEDURE  Hemodialysis is performed while you are sitting or reclining. During the procedure, you may sleep, read, watch television, or perform any other tasks that can be done while you are in this position. If you experience side effects or any other discomfort during the procedure, let your caregiver know. He or she may be able to make adjustments to help you feel more comfortable. Your hemodialysis process may look something like this: 1. Your weight, blood pressure, pulse, and temperature will be measured. 2. The skin around your vascular access will be sterilized. 3. Your vascular access will be connected to the dialyzer. If your access is a fistula or graft, two needles will be inserted through the vascular access. The needles will be connected to a plastic tube that is connected to the dialyzer. They will be taped to your skin so that they do not move out of place. If your access is a catheter, it will be connected to a plastic tube that is connected to the dialyzer. 4. The dialysis machine will  be turned on. This will cause your blood to flow to the dialyzer. The dialyzer will filter and clean your blood before returning it to your body. While the dialysis machine is working, your blood pressure and pulse will be checked several times. 5. Once dialysis is complete, your vascular access will be disconnected from the dialyzer. If your access is a fistula or graft, the  needles will be removed and a bandage (dressing) will be placed over the access. AFTER THE PROCEDURE  Your weight will be measured.  Your blood may be tested to see whether your treatments are removing enough wastes. This is usually done once a month.  You may experience or continue to experience side effects, including:  Dizziness.  Muscle cramps.  Nausea.  Headaches.  Feeling tired. Energy levels usually return to normal the day after the procedure.  Itchiness. Your caregiver may be able to prescribe medication to relieve it.  Achy or jittery legs. You may feel like kicking your legs. This sometimes causes sleeping problems.  Allergic reaction to the chemicals used to sterilize the dialyzer. Document Released: 11/02/2012 Document Reviewed: 11/02/2012 [facility] Patient Information 2014 [place], [place].  Colitis Colitis is inflammation of the colon. Colitis can be a short-term or long-standing (chronic) illness. Crohn's disease and ulcerative colitis are 2 types of colitis which are chronic. They usually require lifelong treatment. CAUSES  There are many different causes of colitis, including:  Viruses.  Germs (bacteria).  Medicine reactions. SYMPTOMS   Diarrhea.  Intestinal bleeding.  Pain.  Fever.  Throwing up (vomiting).  Tiredness (fatigue).  Weight loss.  Bowel blockage. DIAGNOSIS  The diagnosis of colitis is based on examination and stool or blood tests. X-rays, CT scan, and colonoscopy may also be needed. TREATMENT  Treatment may include:  Fluids given through the vein (intravenously).  Bowel rest (nothing to eat or drink for a period of time).  Medicine for pain and diarrhea.  Medicines (antibiotics) that kill germs.  Cortisone medicines.  Surgery. HOME CARE INSTRUCTIONS   Get plenty of rest.  Drink enough water and fluids to keep your urine clear or pale yellow.  Eat a well-balanced diet.  Call your caregiver for follow-up as  recommended. SEEK IMMEDIATE MEDICAL CARE IF:   You develop chills.  You have an oral temperature above 102 F (38.9 C), not controlled by medicine.  You have extreme weakness, fainting, or dehydration.  You have repeated vomiting.  You develop severe belly (abdominal) pain or are passing bloody or tarry stools. MAKE SURE YOU:   Understand these instructions.  Will watch your condition.  Will get help right away if you are not doing well or get worse. Document Released: 08/15/2004 Document Revised: 09/30/2011 Document Reviewed: 11/10/2009 [facility] Patient Information 2014 [place], [place].

## 2013-12-21 NOTE — ED Notes (Signed)
Lab at bedside

## 2014-01-02 ENCOUNTER — Emergency Department (HOSPITAL_COMMUNITY): Payer: Medicare Other

## 2014-01-02 ENCOUNTER — Emergency Department (HOSPITAL_COMMUNITY)
Admission: EM | Admit: 2014-01-02 | Discharge: 2014-01-02 | Disposition: A | Discharge status: Home / Self Care | Payer: Medicare Other | Attending: Emergency Medicine | Admitting: Emergency Medicine

## 2014-01-02 DIAGNOSIS — Z992 Dependence on renal dialysis: Secondary | ICD-10-CM | POA: Insufficient documentation

## 2014-01-02 DIAGNOSIS — IMO0002 Reserved for concepts with insufficient information to code with codable children: Secondary | ICD-10-CM | POA: Insufficient documentation

## 2014-01-02 DIAGNOSIS — E119 Type 2 diabetes mellitus without complications: Secondary | ICD-10-CM | POA: Insufficient documentation

## 2014-01-02 DIAGNOSIS — I120 Hypertensive chronic kidney disease with stage 5 chronic kidney disease or end stage renal disease: Secondary | ICD-10-CM | POA: Insufficient documentation

## 2014-01-02 DIAGNOSIS — N186 End stage renal disease: Secondary | ICD-10-CM | POA: Insufficient documentation

## 2014-01-02 DIAGNOSIS — R112 Nausea with vomiting, unspecified: Secondary | ICD-10-CM | POA: Insufficient documentation

## 2014-01-02 DIAGNOSIS — Z79899 Other long term (current) drug therapy: Secondary | ICD-10-CM | POA: Insufficient documentation

## 2014-01-02 DIAGNOSIS — F411 Generalized anxiety disorder: Secondary | ICD-10-CM | POA: Insufficient documentation

## 2014-01-02 DIAGNOSIS — Z792 Long term (current) use of antibiotics: Secondary | ICD-10-CM | POA: Insufficient documentation

## 2014-01-02 DIAGNOSIS — F3289 Other specified depressive episodes: Secondary | ICD-10-CM | POA: Insufficient documentation

## 2014-01-02 DIAGNOSIS — Z87891 Personal history of nicotine dependence: Secondary | ICD-10-CM | POA: Insufficient documentation

## 2014-01-02 DIAGNOSIS — R079 Chest pain, unspecified: Secondary | ICD-10-CM | POA: Insufficient documentation

## 2014-01-02 DIAGNOSIS — F329 Major depressive disorder, single episode, unspecified: Secondary | ICD-10-CM | POA: Insufficient documentation

## 2014-01-02 LAB — I-STAT TROPONIN, ED
Troponin i, poc: 0.07 ng/mL (ref 0.00–0.08)
Troponin i, poc: 0.05 ng/mL (ref 0.00–0.08)

## 2014-01-02 LAB — PRO B NATRIURETIC PEPTIDE

## 2014-01-02 LAB — BASIC METABOLIC PANEL
BUN: 58 mg/dL — AB (ref 6–23)
CALCIUM: 8.6 mg/dL (ref 8.4–10.5)
CO2: 20 mEq/L (ref 19–32)
CREATININE: 13.2 mg/dL — AB (ref 0.50–1.35)
Chloride: 98 mEq/L (ref 96–112)
GFR calc Af Amer: 4 mL/min — ABNORMAL LOW (ref >90)
GFR, EST NON AFRICAN AMERICAN: 4 mL/min — AB (ref >90)
Glucose, Bld: 108 mg/dL — ABNORMAL HIGH (ref 70–99)
Potassium: 4.7 mEq/L (ref 3.7–5.3)
Sodium: 137 mEq/L (ref 137–147)

## 2014-01-02 LAB — CBC WITH DIFFERENTIAL/PLATELET
BASOS ABS: 0 10*3/uL (ref 0.0–0.1)
Basophils Relative: 0 % (ref 0–1)
EOS ABS: 0.3 10*3/uL (ref 0.0–0.7)
Eosinophils Relative: 5 % (ref 0–5)
HCT: 32.1 % — ABNORMAL LOW (ref 39.0–52.0)
Hemoglobin: 10.2 g/dL — ABNORMAL LOW (ref 13.0–17.0)
Lymphocytes Relative: 36 % (ref 12–46)
Lymphs Abs: 2.2 10*3/uL (ref 0.7–4.0)
MCH: 26.5 pg (ref 26.0–34.0)
MCHC: 31.8 g/dL (ref 30.0–36.0)
MCV: 83.4 fL (ref 78.0–100.0)
Monocytes Absolute: 0.9 10*3/uL (ref 0.1–1.0)
Monocytes Relative: 15 % — ABNORMAL HIGH (ref 3–12)
NEUTROS ABS: 2.7 10*3/uL (ref 1.7–7.7)
NEUTROS PCT: 44 % (ref 43–77)
Platelets: 213 10*3/uL (ref 150–400)
RBC: 3.85 MIL/uL — ABNORMAL LOW (ref 4.22–5.81)
RDW: 18.2 % — AB (ref 11.5–15.5)
WBC: 6.2 10*3/uL (ref 4.0–10.5)

## 2014-01-02 MED ORDER — POTASSIUM CHLORIDE CRYS ER 20 MEQ PO TBCR
40.0000 meq | EXTENDED_RELEASE_TABLET | Freq: Once | ORAL | Status: DC
Start: 2014-01-02 — End: 2014-01-02

## 2014-01-02 MED ORDER — ONDANSETRON HCL 4 MG/2ML IJ SOLN
4.0000 mg | Freq: Once | INTRAMUSCULAR | Status: AC
  Administered 2014-01-02: 4 mg via INTRAVENOUS
  Filled 2014-01-02: qty 2

## 2014-01-02 MED ORDER — MORPHINE SULFATE 4 MG/ML IJ SOLN: 4.0000 mg | Freq: Once | INTRAMUSCULAR | Status: DC

## 2014-01-02 MED ORDER — MORPHINE SULFATE 4 MG/ML IJ SOLN
4.0000 mg | Freq: Once | INTRAMUSCULAR | Status: AC
  Administered 2014-01-02: 4 mg via INTRAVENOUS
  Filled 2014-01-02: qty 1

## 2014-01-02 NOTE — Discharge Instructions (Signed)
Chest Pain (Nonspecific) °It is often hard to give a specific diagnosis for the cause of chest pain. There is always a chance that your pain could be related to something serious, such as a heart attack or a blood clot in the lungs. You need to follow up with your caregiver for further evaluation. °CAUSES  °· Heartburn. °· Pneumonia or bronchitis. °· Anxiety or stress. °· Inflammation around your heart (pericarditis) or lung (pleuritis or pleurisy). °· A blood clot in the lung. °· A collapsed lung (pneumothorax). It can develop suddenly on its own (spontaneous pneumothorax) or from injury (trauma) to the chest. °· Shingles infection (herpes zoster virus). °The chest wall is composed of bones, muscles, and cartilage. Any of these can be the source of the pain. °· The bones can be bruised by injury. °· The muscles or cartilage can be strained by coughing or overwork. °· The cartilage can be affected by inflammation and become sore (costochondritis). °DIAGNOSIS  °Lab tests or other studies, such as X-rays, electrocardiography, stress testing, or cardiac imaging, may be needed to find the cause of your pain.  °TREATMENT  °· Treatment depends on what may be causing your chest pain. Treatment may include: °· Acid blockers for heartburn. °· Anti-inflammatory medicine. °· Pain medicine for inflammatory conditions. °· Antibiotics if an infection is present. °· You may be advised to change lifestyle habits. This includes stopping smoking and avoiding alcohol, caffeine, and chocolate. °· You may be advised to keep your head raised (elevated) when sleeping. This reduces the chance of acid going backward from your stomach into your esophagus. °· Most of the time, nonspecific chest pain will improve within 2 to 3 days with rest and mild pain medicine. °HOME CARE INSTRUCTIONS  °· If antibiotics were prescribed, take your antibiotics as directed. Finish them even if you start to feel better. °· For the next few days, avoid physical  activities that bring on chest pain. Continue physical activities as directed. °· Do not smoke. °· Avoid drinking alcohol. °· Only take over-the-counter or prescription medicine for pain, discomfort, or fever as directed by your caregiver. °· Follow your caregiver's suggestions for further testing if your chest pain does not go away. °· Keep any follow-up appointments you made. If you do not go to an appointment, you could develop lasting (chronic) problems with pain. If there is any problem keeping an appointment, you must call to reschedule. °SEEK MEDICAL CARE IF:  °· You think you are having problems from the medicine you are taking. Read your medicine instructions carefully. °· Your chest pain does not go away, even after treatment. °· You develop a rash with blisters on your chest. °SEEK IMMEDIATE MEDICAL CARE IF:  °· You have increased chest pain or pain that spreads to your arm, neck, jaw, back, or abdomen. °· You develop shortness of breath, an increasing cough, or you are coughing up blood. °· You have severe back or abdominal pain, feel nauseous, or vomit. °· You develop severe weakness, fainting, or chills. °· You have a fever. °THIS IS AN EMERGENCY. Do not wait to see if the pain will go away. Get medical help at once. Call your local emergency services (911 in [place] Do not drive yourself to the hospital. °MAKE SURE YOU:  °· Understand these instructions. °· Will watch your condition. °· Will get help right away if you are not doing well or get worse. °Document Released: 04/17/2005 Document Revised: 09/30/2011 Document Reviewed: 02/11/2008 °[employer]® Patient Information ©2014 [employer]. ° °

## 2014-01-02 NOTE — ED Notes (Signed)
Bed: WA20 Expected date:  Expected time:  Means of arrival:  Comments: 

## 2014-01-02 NOTE — ED Notes (Signed)
Pt reports cp that woke him up tonight.  Worse with inspiration.  Pt was actively vomiting when he arrived in the room.  Pt is A&Ox 4.  Pt reports vomiting x 3.  Pt received dialysis yesterday but states that he did not finish getting it.

## 2014-01-02 NOTE — ED Notes (Signed)
Pt arrived to the ED with a complaint of chest pain.  Pain is located in the left chest and radiates to the back.  Pt is having shortness of breath.

## 2014-01-02 NOTE — ED Provider Notes (Signed)
CSN: [id number]     Arrival date & time 01/02/14  6213 History   First MD Initiated Contact with Patient 01/02/14 0244     Chief Complaint  Patient presents with  . Chest Pain    (Consider location/radiation/quality/duration/timing/severity/associated sxs/prior Treatment) HPI Comments: Patient is a 48 year old male with a history of hypertension, DM, and end-stage renal disease, on dialysis Tuesday, Thursday, and Saturday, who presents to the ED today for chest pain. Patient states that chest pain awoke him from sleep at approximately 0200 and was sharp in nature. Patient states that pain is located in the right side of his chest. He denies radiation of the pain, despite triage note, and states the pain is made worse with deep inspiration. He states he has had pain like this in the past associated with "high potassium levels". Patient endorses associated nausea and NB/NB emesis x 3. He denies associated fever, syncope, near syncope, hemoptysis, hematemesis, SOB, abdominal pain, back pain, numbness/tingling, and extremity weakness. He states he was dialyzed yesterday, but "still needs to be dialyzed more". No hx of ACS, CAD, or DVT/PE.  The history is provided by the patient. No language interpreter was used.    Past Medical History  Diagnosis Date  . Hypertension   . ESRD (end stage renal disease)     due to HTN per patient, followed at [facility], s/p failed kidney transplant  . Depression   . Complication of anesthesia     itching, sore throat  . Diabetes mellitus without complication     No history per patient, but remains under history as A1c would not be accurate given on dialysis  . Shortness of breath   . Anxiety    Past Surgical History  Procedure Laterality Date  . Kidney receipient  2006    failed and started HD in March 2014  . Capd insertion    . Capd removal     No family history on file. History  Substance Use Topics  . Smoking status: Former Smoker -- 1.00 packs/day  for 1 years    Types: Cigarettes  . Smokeless tobacco: Never Used     Comment: quit Jan 2014  . Alcohol Use: No    Review of Systems  Constitutional: Negative for fever.  Respiratory: Negative for shortness of breath.   Cardiovascular: Positive for chest pain.  Gastrointestinal: Positive for nausea and vomiting. Negative for abdominal pain.  Neurological: Negative for syncope, weakness and numbness.  All other systems reviewed and are negative.    Allergies  Ferrlecit and Darvocet  Home Medications   Prior to Admission medications   Medication Sig Start Date End Date Taking? Authorizing Provider  allopurinol (ZYLOPRIM) 100 MG tablet Take 1 tablet (100 mg total) by mouth daily. 04/21/13   [name], MD  amLODipine (NORVASC) 5 MG tablet Take 10 mg by mouth daily.    Historical Provider, MD  cinacalcet (SENSIPAR) 30 MG tablet Take 30 mg by mouth daily.    Historical Provider, MD  ciprofloxacin (CIPRO) 500 MG tablet Take 1 tablet (500 mg total) by mouth 2 (two) times daily. 12/21/13   Kalman Drape, MD  cloNIDine (CATAPRES) 0.3 MG tablet Take 1 tablet (0.3 mg total) by mouth 3 (three) times daily. 10/18/13   [name], MD  colchicine 0.6 MG tablet Take 0.6 mg by mouth daily as needed (for gout). 04/13/13   [name], MD  furosemide (LASIX) 80 MG tablet Take 80 mg by mouth daily.  Historical Provider, MD  labetalol (NORMODYNE) 200 MG tablet Take 200 mg by mouth 2 (two) times daily.     Historical Provider, MD  lisinopril (PRINIVIL,ZESTRIL) 20 MG tablet Take 20 mg by mouth daily.    Historical Provider, MD  metroNIDAZOLE (FLAGYL) 500 MG tablet Take 1 tablet (500 mg total) by mouth 2 (two) times daily. 12/21/13   Kalman Drape, MD  minoxidil (LONITEN) 2.5 MG tablet Take 2.5 mg by mouth daily.  10/12/13   Historical Provider, MD  omeprazole (PRILOSEC) 20 MG capsule Take 20 mg by mouth daily. 07/01/13   Historical Provider, MD  ondansetron (ZOFRAN ODT) 8 MG disintegrating tablet  Take 1 tablet (8 mg total) by mouth every 8 (eight) hours as needed for nausea or vomiting. 12/05/13   Dorie Rank, MD  ondansetron (ZOFRAN ODT) 8 MG disintegrating tablet Take 1 tablet (8 mg total) by mouth every 8 (eight) hours as needed for nausea or vomiting. 12/21/13   Kalman Drape, MD  predniSONE (DELTASONE) 5 MG tablet Take 15 mg by mouth daily with breakfast.    Historical Provider, MD  promethazine (PHENERGAN) 25 MG tablet Take 1 tablet (25 mg total) by mouth every 6 (six) hours as needed for nausea. 12/21/13   Kalman Drape, MD  tacrolimus (PROGRAF) 5 MG capsule Take 10 mg by mouth 2 (two) times daily.     Historical Provider, MD  traMADol (ULTRAM) 50 MG tablet Take 1 tablet (50 mg total) by mouth every 6 (six) hours as needed. 12/21/13   Kalman Drape, MD  valGANciclovir (VALCYTE) 450 MG tablet Take 450 mg by mouth every Monday, Wednesday, and Friday.    Historical Provider, MD   BP 152/107  Pulse 79  Temp(Src) 97.5 F (36.4 C) (Oral)  Resp 12  SpO2 98%  Physical Exam  Nursing note and vitals reviewed. Constitutional: He is oriented to person, place, and time. He appears well-developed and well-nourished. No distress.  Nontoxic/nonseptic appearing  HENT:  Head: Normocephalic and atraumatic.  Mouth/Throat: Oropharynx is clear and moist. No oropharyngeal exudate.  Eyes: Conjunctivae and EOM are normal. No scleral icterus.  Neck: Normal range of motion. Neck supple.  No JVD  Cardiovascular: Normal rate, regular rhythm and intact distal pulses.   Distal radial pulses 2+ b/l  Pulmonary/Chest: Effort normal and breath sounds normal. No respiratory distress. He has no wheezes. He exhibits no tenderness.  Chest expansion symmetric with mild splinting secondary to worsening pain with deep inspiration. No tachypnea, dyspnea, or accessory muscle use.  Abdominal: Soft. He exhibits no distension. There is no tenderness. There is no rebound and no guarding.  Abdomen soft without masses or TTP   Musculoskeletal: Normal range of motion.  Neurological: He is alert and oriented to person, place, and time.  GCS 15. Speech is goal oriented. Patient moves extremities without ataxia.  Skin: Skin is warm and dry. No rash noted. He is not diaphoretic. No erythema. No pallor.  Psychiatric: He has a normal mood and affect. His behavior is normal.    ED Course  Procedures (including critical care time) Labs Review Labs Reviewed  BASIC METABOLIC PANEL - Abnormal; Notable for the following:    Glucose, Bld 108 (*)    BUN 58 (*)    Creatinine, Ser 13.20 (*)    GFR calc non Af Amer 4 (*)    GFR calc Af Amer 4 (*)    All other components within normal limits  PRO B NATRIURETIC PEPTIDE -  Abnormal; Notable for the following:    Pro B Natriuretic peptide (BNP) >70000.0 (*)    All other components within normal limits  CBC WITH DIFFERENTIAL - Abnormal; Notable for the following:    RBC 3.85 (*)    Hemoglobin 10.2 (*)    HCT 32.1 (*)    RDW 18.2 (*)    Monocytes Relative 15 (*)    All other components within normal limits  I-STAT TROPOININ, ED  Randolm Idol, ED    Imaging Review Dg Chest 2 View  01/02/2014   CLINICAL DATA:  Left-sided chest pain, radiating to the back. Shortness of breath. History of smoking.  EXAM: CHEST  2 VIEW  COMPARISON:  Chest radiograph performed 12/21/2013  FINDINGS: The lungs are well-aerated. Vascular congestion is noted, without definite pulmonary edema. No pleural effusion or pneumothorax is seen.  The heart is mildly enlarged. A right-sided dual-lumen catheter is seen ending within the right atrium. No acute osseous abnormalities are seen.  IMPRESSION: Vascular congestion and mild cardiomegaly, without definite pulmonary edema.   Electronically Signed   By: Garald Balding M.D.   On: 01/02/2014 03:32     EKG Interpretation   Date/Time:  Sunday January 02 2014 02:51:02 EDT Ventricular Rate:  75 PR Interval:  177 QRS Duration: 93 QT Interval:  484 QTC  Calculation: 541 R Axis:   -9 Text Interpretation:  Sinus rhythm RSR' in V1 or V2, probably normal  variant Probable anterolateral infarct, old Abnormal T, consider ischemia,  lateral leads Prolonged QT interval ED PHYSICIAN INTERPRETATION AVAILABLE  IN [facility] Confirmed by TEST, Record ([id number]) on 01/04/2014 7:49:08  AM      MDM   Final diagnoses:  Chest pain    48 year old male presents to the emergency department today for chest pain. Chest pain pleuritic in nature, worse with deep inspiration. Patient explicitly denies to me any radiation of the pain despite initially stating in triage that pain radiated to back. Patient found to be hypertensive on arrival to 164/115. Patient was treated with morphine and Zofran for his symptoms and blood pressure normalized over ED course to 127/87. Patient also states pain completely resolved with this treatment. By end of 5 hour long stay in the ED, patient denied any chest pain and was found to frequently be sleeping comfortably in the examining bed.  Physical exam today was unremarkable. Patient's workup also consistent with prior workups. Patient states he has had similar chest pain in the past associated with "high potassium levels". Patient's potassium today is normal. H/H also stable. EKG without no acute ischemic changes; no evidence of STEMI. CXR also stable as compared to prior. Troponin x 2 today are negative.  Doubt ACS as cause of symptoms today. Patient has no history of ACS or coronary artery disease and his symptoms are atypical given their pleuritic nature. Also doubt aortic dissection or pulmonary embolism. Patient has been without tachycardia, tachypnea, dyspnea, or hypoxia today. Also doubt PNA given lack of focal consolidation on imaging, lack of fever, and lack of tachypnea or hypoxia. Given reassuring and stable work up, believe patient is stable for d/c with instruction to f/u with his PCP regarding today's symptoms. Return  precautions discussed with the patient who verbalizes comfort and understanding with this discharge plan with no unaddressed concerns.   Filed Vitals:   01/02/14 0500 01/02/14 0640 01/02/14 0700 01/02/14 0732  BP: 152/107 123/81 127/87   Pulse:  58 59   Temp:    98.3 F (36.8  C)  TempSrc:    Oral  Resp:  12 12   SpO2:  97% 97%        [name], PA-C 01/09/14 1930

## 2014-01-02 NOTE — ED Notes (Signed)
Pt is discharged, however he came back to nurses station and sts "I just need to stretch a little bit, because I still feel some pain in my chest." When asked does he wants to talk to EDP, he sts "No, I just need to relax for a few minutes". Agricultural consultant notified.

## 2014-01-09 NOTE — ED Provider Notes (Signed)
Medical screening examination/treatment/procedure(s) were performed by non-physician practitioner and as supervising physician I was immediately available for consultation/collaboration.   EKG Interpretation   Date/Time:  Sunday January 02 2014 02:51:02 EDT Ventricular Rate:  75 PR Interval:  177 QRS Duration: 93 QT Interval:  484 QTC Calculation: 541 R Axis:   -9 Text Interpretation:  Sinus rhythm RSR' in V1 or V2, probably normal  variant Probable anterolateral infarct, old Abnormal T, consider ischemia,  lateral leads Prolonged QT interval ED PHYSICIAN INTERPRETATION AVAILABLE  IN [facility] Confirmed by TEST, Record ([id number]) on 01/04/2014 7:49:08  AM        Elyn Peers, MD 01/09/14 2319

## 2014-01-11 ENCOUNTER — Emergency Department (HOSPITAL_COMMUNITY)
Admission: EM | Admit: 2014-01-11 | Discharge: 2014-01-11 | Disposition: A | Discharge status: Home / Self Care | Payer: Medicare Other | Attending: Emergency Medicine | Admitting: Emergency Medicine

## 2014-01-11 ENCOUNTER — Emergency Department (HOSPITAL_COMMUNITY): Payer: Medicare Other

## 2014-01-11 ENCOUNTER — Encounter (HOSPITAL_COMMUNITY): Payer: Self-pay | Admitting: Emergency Medicine

## 2014-01-11 DIAGNOSIS — Z79899 Other long term (current) drug therapy: Secondary | ICD-10-CM | POA: Insufficient documentation

## 2014-01-11 DIAGNOSIS — F329 Major depressive disorder, single episode, unspecified: Secondary | ICD-10-CM | POA: Insufficient documentation

## 2014-01-11 DIAGNOSIS — Z87891 Personal history of nicotine dependence: Secondary | ICD-10-CM | POA: Insufficient documentation

## 2014-01-11 DIAGNOSIS — I120 Hypertensive chronic kidney disease with stage 5 chronic kidney disease or end stage renal disease: Secondary | ICD-10-CM

## 2014-01-11 DIAGNOSIS — Z992 Dependence on renal dialysis: Secondary | ICD-10-CM | POA: Insufficient documentation

## 2014-01-11 DIAGNOSIS — N186 End stage renal disease: Secondary | ICD-10-CM

## 2014-01-11 DIAGNOSIS — Z91158 Patient's noncompliance with renal dialysis for other reason: Secondary | ICD-10-CM | POA: Insufficient documentation

## 2014-01-11 DIAGNOSIS — Z792 Long term (current) use of antibiotics: Secondary | ICD-10-CM | POA: Insufficient documentation

## 2014-01-11 DIAGNOSIS — F411 Generalized anxiety disorder: Secondary | ICD-10-CM

## 2014-01-11 DIAGNOSIS — R112 Nausea with vomiting, unspecified: Secondary | ICD-10-CM

## 2014-01-11 DIAGNOSIS — Z9115 Patient's noncompliance with renal dialysis: Secondary | ICD-10-CM

## 2014-01-11 DIAGNOSIS — E875 Hyperkalemia: Secondary | ICD-10-CM | POA: Insufficient documentation

## 2014-01-11 DIAGNOSIS — R0602 Shortness of breath: Secondary | ICD-10-CM | POA: Insufficient documentation

## 2014-01-11 DIAGNOSIS — IMO0002 Reserved for concepts with insufficient information to code with codable children: Secondary | ICD-10-CM | POA: Insufficient documentation

## 2014-01-11 DIAGNOSIS — E119 Type 2 diabetes mellitus without complications: Secondary | ICD-10-CM | POA: Insufficient documentation

## 2014-01-11 DIAGNOSIS — F3289 Other specified depressive episodes: Secondary | ICD-10-CM | POA: Insufficient documentation

## 2014-01-11 LAB — CBC
HEMATOCRIT: 30.2 % — AB (ref 39.0–52.0)
HEMOGLOBIN: 9.5 g/dL — AB (ref 13.0–17.0)
MCH: 26.5 pg (ref 26.0–34.0)
MCHC: 31.5 g/dL (ref 30.0–36.0)
MCV: 84.4 fL (ref 78.0–100.0)
Platelets: 181 10*3/uL (ref 150–400)
RBC: 3.58 MIL/uL — AB (ref 4.22–5.81)
RDW: 17.6 % — ABNORMAL HIGH (ref 11.5–15.5)
WBC: 5.6 10*3/uL (ref 4.0–10.5)

## 2014-01-11 LAB — BASIC METABOLIC PANEL
BUN: 57 mg/dL — AB (ref 6–23)
CHLORIDE: 100 meq/L (ref 96–112)
CO2: 20 meq/L (ref 19–32)
CREATININE: 14.16 mg/dL — AB (ref 0.50–1.35)
Calcium: 8.4 mg/dL (ref 8.4–10.5)
GFR calc Af Amer: 4 mL/min — ABNORMAL LOW (ref >90)
GFR calc non Af Amer: 4 mL/min — ABNORMAL LOW (ref >90)
GLUCOSE: 101 mg/dL — AB (ref 70–99)
Potassium: 6.3 mEq/L — ABNORMAL HIGH (ref 3.7–5.3)
Sodium: 136 mEq/L — ABNORMAL LOW (ref 137–147)

## 2014-01-11 LAB — I-STAT TROPONIN, ED: Troponin i, poc: 0.04 ng/mL (ref 0.00–0.08)

## 2014-01-11 MED ORDER — SODIUM POLYSTYRENE SULFONATE 15 GM/60ML PO SUSP
45.0000 g | Freq: Once | ORAL | Status: AC
  Administered 2014-01-11: 45 g via ORAL
  Filled 2014-01-11: qty 180

## 2014-01-11 NOTE — ED Notes (Signed)
Pt refused to take dc papers.

## 2014-01-11 NOTE — ED Provider Notes (Signed)
CSN: [id number]     Arrival date & time 01/11/14  2110 History   First MD Initiated Contact with Patient 01/11/14 2217     Chief Complaint  Patient presents with  . Chest Pain      HPI Patient presents emergency department because of some mid chest discomfort with very mild shortness of breath.  He denies dyspnea on exertion.  No orthopnea.  He is end-stage renal disease and dialyzes Tuesday Thursday Saturday.  He states he missed his dialysis earlier today.  He feels as though he may need dialysis.  He reports the chest discomfort is constant and has been constant over the past 24 hours.  It's worse with movement and palpation of his anterior chest.   Past Medical History  Diagnosis Date  . Hypertension   . ESRD (end stage renal disease)     due to HTN per patient, followed at [facility], s/p failed kidney transplant  . Depression   . Complication of anesthesia     itching, sore throat  . Diabetes mellitus without complication     No history per patient, but remains under history as A1c would not be accurate given on dialysis  . Shortness of breath   . Anxiety    Past Surgical History  Procedure Laterality Date  . Kidney receipient  2006    failed and started HD in March 2014  . Capd insertion    . Capd removal     No family history on file. History  Substance Use Topics  . Smoking status: Former Smoker -- 1.00 packs/day for 1 years    Types: Cigarettes  . Smokeless tobacco: Never Used     Comment: quit Jan 2014  . Alcohol Use: No    Review of Systems  All other systems reviewed and are negative.     Allergies  Ferrlecit and Darvocet  Home Medications   Prior to Admission medications   Medication Sig Start Date End Date Taking? Authorizing Provider  allopurinol (ZYLOPRIM) 100 MG tablet Take 1 tablet (100 mg total) by mouth daily. 04/21/13  Yes [name], MD  amLODipine (NORVASC) 5 MG tablet Take 10 mg by mouth daily.   Yes Historical Provider, MD   cinacalcet (SENSIPAR) 30 MG tablet Take 30 mg by mouth daily.   Yes Historical Provider, MD  ciprofloxacin (CIPRO) 500 MG tablet Take 1 tablet (500 mg total) by mouth 2 (two) times daily. 12/21/13  Yes Kalman Drape, MD  cloNIDine (CATAPRES) 0.3 MG tablet Take 1 tablet (0.3 mg total) by mouth 3 (three) times daily. 10/18/13  Yes [name], MD  colchicine 0.6 MG tablet Take 0.6 mg by mouth daily as needed (for gout). 04/13/13  Yes [name], MD  furosemide (LASIX) 80 MG tablet Take 80 mg by mouth daily.   Yes Historical Provider, MD  labetalol (NORMODYNE) 200 MG tablet Take 300 mg by mouth 2 (two) times daily.    Yes Historical Provider, MD  lisinopril (PRINIVIL,ZESTRIL) 20 MG tablet Take 20 mg by mouth daily.   Yes Historical Provider, MD  metroNIDAZOLE (FLAGYL) 500 MG tablet Take 1 tablet (500 mg total) by mouth 2 (two) times daily. 12/21/13  Yes Kalman Drape, MD  omeprazole (PRILOSEC) 20 MG capsule Take 20 mg by mouth daily. 07/01/13  Yes Historical Provider, MD  ondansetron (ZOFRAN ODT) 8 MG disintegrating tablet Take 1 tablet (8 mg total) by mouth every 8 (eight) hours as needed for nausea or vomiting.  12/05/13  Yes Dorie Rank, MD  predniSONE (DELTASONE) 5 MG tablet Take 15 mg by mouth daily with breakfast.   Yes Historical Provider, MD  promethazine (PHENERGAN) 25 MG tablet Take 1 tablet (25 mg total) by mouth every 6 (six) hours as needed for nausea. 12/21/13  Yes Kalman Drape, MD  sevelamer carbonate (RENVELA) 800 MG tablet Take 1,600 mg by mouth 3 (three) times daily with meals. 12/27/13 12/27/14 Yes Historical Provider, MD  tacrolimus (PROGRAF) 5 MG capsule Take 10 mg by mouth 2 (two) times daily.    Yes Historical Provider, MD  minoxidil (LONITEN) 2.5 MG tablet Take 2.5 mg by mouth daily.  10/12/13   Historical Provider, MD   BP 158/106  Pulse 64  Temp(Src) 98.6 F (37 C) (Oral)  Resp 14  Ht 6' 1.5" (1.867 m)  Wt 185 lb (83.915 kg)  BMI 24.07 kg/m2  SpO2 94% Physical Exam  Nursing  note and vitals reviewed. Constitutional: He is oriented to person, place, and time. He appears well-developed and well-nourished.  HENT:  Head: Normocephalic and atraumatic.  Eyes: EOM are normal.  Neck: Normal range of motion.  Cardiovascular: Normal rate, regular rhythm, normal heart sounds and intact distal pulses.   Pulmonary/Chest: Effort normal and breath sounds normal. No respiratory distress. He has no wheezes. He has no rales.  Abdominal: Soft. He exhibits no distension. There is no tenderness.  Musculoskeletal: Normal range of motion.  Neurological: He is alert and oriented to person, place, and time.  Skin: Skin is warm and dry.  Psychiatric: He has a normal mood and affect. Judgment normal.    ED Course  Procedures (including critical care time) Labs Review Labs Reviewed  CBC - Abnormal; Notable for the following:    RBC 3.58 (*)    Hemoglobin 9.5 (*)    HCT 30.2 (*)    RDW 17.6 (*)    All other components within normal limits  BASIC METABOLIC PANEL - Abnormal; Notable for the following:    Sodium 136 (*)    Potassium 6.3 (*)    Glucose, Bld 101 (*)    BUN 57 (*)    Creatinine, Ser 14.16 (*)    GFR calc non Af Amer 4 (*)    GFR calc Af Amer 4 (*)    All other components within normal limits  I-STAT TROPOININ, ED    Imaging Review Dg Chest 2 View  01/11/2014   CLINICAL DATA:  Chest pain today, history hypertension, end-stage renal disease, diabetes  EXAM: CHEST  2 VIEW  COMPARISON:  01/02/2014  FINDINGS: RIGHT jugular central venous catheter with tip projecting over cavoatrial junction versus high RIGHT atrium, unchanged.  Enlargement of cardiac silhouette with pulmonary vascular congestion.  Mediastinal contours normal.  No acute failure or consolidation.  No pleural effusion or pneumothorax.  Bones unremarkable.  IMPRESSION: Enlargement of cardiac silhouette with pulmonary vascular congestion.  No acute infiltrate.   Electronically Signed   By: Lavonia Dana M.D.    On: 01/11/2014 21:51     EKG Interpretation   Date/Time:  Tuesday January 11 2014 21:14:26 EDT Ventricular Rate:  69 PR Interval:  172 QRS Duration: 88 QT Interval:  448 QTC Calculation: 480 R Axis:   -45 Text Interpretation:  Normal sinus rhythm Possible Left atrial enlargement  Left anterior fascicular block Cannot rule out Anterior infarct , age  undetermined Abnormal ECG ** Poor data quality, interpretation may be  adversely affected No significant change was found Confirmed  by Venora Maples   MD, Lennette Bihari ([id number]) on 01/11/2014 10:16:56 PM      MDM   Final diagnoses:  Hyperkalemia  Noncompliance with renal dialysis    Patient's name and around the emergency apartment.  He has no increased work of breathing.  No hypoxia.  He does not appear in distress.  Patient's potassium is 6.3.  He'll be given a dose of Kayexalate.  I stressed importance of need for followup with his dialysis team first thing in the morning for dialysis in the morning.  He states he will call his treatment Center and he understands the importance of doing so.  I do not think the patient needs emergent dialysis tonight when this can be obtained first thing tomorrow morning.  He understands return to the ER for new or worsening symptoms    Hoy Morn, MD 01/11/14 2332

## 2014-01-11 NOTE — ED Notes (Signed)
Pt. reports intermittent mid chest pain with SOB and dry cough onset this morning , pt. missed his hemodialysis today , denies fever or chills. No nausea or diaphoresis.

## 2014-01-11 NOTE — ED Notes (Signed)
Pt disconnecting self from monitor and will not remain in bed.

## 2014-01-12 ENCOUNTER — Emergency Department (HOSPITAL_COMMUNITY)
Admission: EM | Admit: 2014-01-12 | Discharge: 2014-01-12 | Disposition: A | Discharge status: Home / Self Care | Payer: Medicare Other | Source: Home / Self Care | Attending: Emergency Medicine | Admitting: Emergency Medicine

## 2014-01-12 ENCOUNTER — Encounter (HOSPITAL_COMMUNITY): Payer: Self-pay | Admitting: Emergency Medicine

## 2014-01-12 DIAGNOSIS — R112 Nausea with vomiting, unspecified: Secondary | ICD-10-CM

## 2014-01-12 MED ORDER — PROMETHAZINE HCL 25 MG PO TABS
25.0000 mg | ORAL_TABLET | Freq: Once | ORAL | Status: AC
  Administered 2014-01-12: 25 mg via ORAL
  Filled 2014-01-12: qty 1

## 2014-01-12 MED ORDER — SODIUM POLYSTYRENE SULFONATE 15 GM/60ML PO SUSP
30.0000 g | Freq: Once | ORAL | Status: AC
  Administered 2014-01-12: 15 g via ORAL
  Filled 2014-01-12: qty 120

## 2014-01-12 MED ORDER — ONDANSETRON 4 MG PO TBDP
4.0000 mg | ORAL_TABLET | Freq: Once | ORAL | Status: AC
  Administered 2014-01-12: 4 mg via ORAL
  Filled 2014-01-12: qty 1

## 2014-01-12 NOTE — ED Notes (Signed)
Pt states he will not take kayexalate today right now but he will wait til Phenergan starts to work

## 2014-01-12 NOTE — ED Notes (Signed)
Pt has not vomited since being triaged.

## 2014-01-12 NOTE — ED Provider Notes (Signed)
CSN: [id number]     Arrival date & time 01/11/14  2357 History   First MD Initiated Contact with Patient 01/12/14 0204     Chief Complaint  Patient presents with  . Emesis     (Consider location/radiation/quality/duration/timing/severity/associated sxs/prior Treatment) Patient is a 48 y.o. male presenting with vomiting. The history is provided by the patient.  Emesis Severity:  Mild Timing:  Constant Quality:  Stomach contents Progression:  Worsening Chronicity:  New Relieved by:  Nothing Worsened by:  Nothing tried Associated symptoms: diarrhea   Associated symptoms: no abdominal pain     Past Medical History  Diagnosis Date  . Hypertension   . ESRD (end stage renal disease)     due to HTN per patient, followed at [facility], s/p failed kidney transplant  . Depression   . Complication of anesthesia     itching, sore throat  . Diabetes mellitus without complication     No history per patient, but remains under history as A1c would not be accurate given on dialysis  . Shortness of breath   . Anxiety    Past Surgical History  Procedure Laterality Date  . Kidney receipient  2006    failed and started HD in March 2014  . Capd insertion    . Capd removal     History reviewed. No pertinent family history. History  Substance Use Topics  . Smoking status: Former Smoker -- 1.00 packs/day for 1 years    Types: Cigarettes  . Smokeless tobacco: Never Used     Comment: quit Jan 2014  . Alcohol Use: No    Review of Systems  Constitutional: Negative for fever.  Respiratory: Negative for cough and shortness of breath.   Gastrointestinal: Positive for vomiting and diarrhea. Negative for abdominal pain.  All other systems reviewed and are negative.     Allergies  Ferrlecit and Darvocet  Home Medications   Prior to Admission medications   Medication Sig Start Date End Date Taking? Authorizing Provider  allopurinol (ZYLOPRIM) 100 MG tablet Take 1 tablet (100 mg total) by  mouth daily. 04/21/13  Yes [name], MD  amLODipine (NORVASC) 5 MG tablet Take 10 mg by mouth daily.   Yes Historical Provider, MD  cinacalcet (SENSIPAR) 30 MG tablet Take 30 mg by mouth daily.   Yes Historical Provider, MD  ciprofloxacin (CIPRO) 500 MG tablet Take 1 tablet (500 mg total) by mouth 2 (two) times daily. 12/21/13  Yes Kalman Drape, MD  cloNIDine (CATAPRES) 0.3 MG tablet Take 1 tablet (0.3 mg total) by mouth 3 (three) times daily. 10/18/13  Yes [name], MD  colchicine 0.6 MG tablet Take 0.6 mg by mouth daily as needed (for gout). 04/13/13  Yes [name], MD  furosemide (LASIX) 80 MG tablet Take 80 mg by mouth daily.   Yes Historical Provider, MD  labetalol (NORMODYNE) 200 MG tablet Take 300 mg by mouth 2 (two) times daily.    Yes Historical Provider, MD  lisinopril (PRINIVIL,ZESTRIL) 20 MG tablet Take 20 mg by mouth daily.   Yes Historical Provider, MD  metroNIDAZOLE (FLAGYL) 500 MG tablet Take 1 tablet (500 mg total) by mouth 2 (two) times daily. 12/21/13  Yes Kalman Drape, MD  minoxidil (LONITEN) 2.5 MG tablet Take 2.5 mg by mouth daily.  10/12/13  Yes Historical Provider, MD  omeprazole (PRILOSEC) 20 MG capsule Take 20 mg by mouth daily. 07/01/13  Yes Historical Provider, MD  ondansetron (ZOFRAN ODT) 8 MG  disintegrating tablet Take 1 tablet (8 mg total) by mouth every 8 (eight) hours as needed for nausea or vomiting. 12/05/13  Yes Dorie Rank, MD  predniSONE (DELTASONE) 5 MG tablet Take 15 mg by mouth daily with breakfast.   Yes Historical Provider, MD  promethazine (PHENERGAN) 25 MG tablet Take 1 tablet (25 mg total) by mouth every 6 (six) hours as needed for nausea. 12/21/13  Yes Kalman Drape, MD  sevelamer carbonate (RENVELA) 800 MG tablet Take 1,600 mg by mouth 3 (three) times daily with meals. 12/27/13 12/27/14 Yes Historical Provider, MD  tacrolimus (PROGRAF) 5 MG capsule Take 10 mg by mouth 2 (two) times daily.    Yes Historical Provider, MD   BP 187/123  Pulse 68   Temp(Src) 97.6 F (36.4 C) (Oral)  Resp 18  SpO2 96% Physical Exam  Constitutional: He is oriented to person, place, and time. He appears well-developed and well-nourished. No distress.  HENT:  Head: Normocephalic and atraumatic.  Mouth/Throat: Oropharynx is clear and moist. No oropharyngeal exudate.  Eyes: EOM are normal. Pupils are equal, round, and reactive to light.  Neck: Normal range of motion. Neck supple.  Cardiovascular: Normal rate and regular rhythm.  Exam reveals no friction rub.   No murmur heard. Pulmonary/Chest: Effort normal and breath sounds normal. No respiratory distress. He has no wheezes. He has no rales.  Abdominal: He exhibits no distension. There is no tenderness. There is no rebound.  Musculoskeletal: Normal range of motion. He exhibits no edema.  Neurological: He is alert and oriented to person, place, and time.  Skin: He is not diaphoretic.    ED Course  Procedures (including critical care time) Labs Review Labs Reviewed - No data to display  Imaging Review Dg Chest 2 View  01/11/2014   CLINICAL DATA:  Chest pain today, history hypertension, end-stage renal disease, diabetes  EXAM: CHEST  2 VIEW  COMPARISON:  01/02/2014  FINDINGS: RIGHT jugular central venous catheter with tip projecting over cavoatrial junction versus high RIGHT atrium, unchanged.  Enlargement of cardiac silhouette with pulmonary vascular congestion.  Mediastinal contours normal.  No acute failure or consolidation.  No pleural effusion or pneumothorax.  Bones unremarkable.  IMPRESSION: Enlargement of cardiac silhouette with pulmonary vascular congestion.  No acute infiltrate.   Electronically Signed   By: Lavonia Dana M.D.   On: 01/11/2014 21:51     EKG Interpretation None      MDM   Final diagnoses:  Non-intractable vomiting with nausea, vomiting of unspecified type    80-year-old male with history as his renal disease presents with vomiting. He was recently just discharged from the  ED. Son have hyperkalemia at 6.3. He has no pulmonary edema peripheral edema, no shortness of breath. No need for emergent dialysis. Given [facility] discharge. Had vomiting after Kayexalate and had multiple episodes. He came back in for his vomiting. Patient still well-appearing, vitals with hypertension, but this is chronic. We'll give PO Phenergan tablet and reattempt Kayexalate Ambulating around the department. Beginning to have diarrhea, which makes me think he did get some of his kayexalate in the first time. Concern he just wants somewhere to sleep. Discharged.  Osvaldo Shipper, MD 01/12/14 [phone]

## 2014-01-12 NOTE — ED Notes (Addendum)
Pt states that he was given medication Mikey Kirschner) and he started vomiting in the waiting room. Pt states that he vomited 3 times in the lobby within 96mn. Pt was seen in triage eating saltine crackers and an empty emesis bag. Pt was triaged and was able to drink the rest of the kayexilate. Pt continued to eat saltine crackers then was given zofran.

## 2014-01-17 ENCOUNTER — Encounter (HOSPITAL_COMMUNITY): Payer: Self-pay | Admitting: Emergency Medicine

## 2014-01-17 ENCOUNTER — Emergency Department (HOSPITAL_COMMUNITY): Payer: Medicare Other

## 2014-01-17 DIAGNOSIS — R609 Edema, unspecified: Secondary | ICD-10-CM | POA: Insufficient documentation

## 2014-01-17 DIAGNOSIS — Z87891 Personal history of nicotine dependence: Secondary | ICD-10-CM | POA: Insufficient documentation

## 2014-01-17 DIAGNOSIS — E119 Type 2 diabetes mellitus without complications: Secondary | ICD-10-CM

## 2014-01-17 DIAGNOSIS — Z992 Dependence on renal dialysis: Secondary | ICD-10-CM

## 2014-01-17 DIAGNOSIS — N186 End stage renal disease: Secondary | ICD-10-CM

## 2014-01-17 DIAGNOSIS — Z792 Long term (current) use of antibiotics: Secondary | ICD-10-CM | POA: Insufficient documentation

## 2014-01-17 DIAGNOSIS — M7989 Other specified soft tissue disorders: Secondary | ICD-10-CM

## 2014-01-17 DIAGNOSIS — IMO0002 Reserved for concepts with insufficient information to code with codable children: Secondary | ICD-10-CM

## 2014-01-17 DIAGNOSIS — E8779 Other fluid overload: Secondary | ICD-10-CM | POA: Insufficient documentation

## 2014-01-17 DIAGNOSIS — F411 Generalized anxiety disorder: Secondary | ICD-10-CM | POA: Insufficient documentation

## 2014-01-17 DIAGNOSIS — R0602 Shortness of breath: Secondary | ICD-10-CM | POA: Diagnosis not present

## 2014-01-17 DIAGNOSIS — Z79899 Other long term (current) drug therapy: Secondary | ICD-10-CM

## 2014-01-17 DIAGNOSIS — I120 Hypertensive chronic kidney disease with stage 5 chronic kidney disease or end stage renal disease: Secondary | ICD-10-CM

## 2014-01-17 LAB — CBC WITH DIFFERENTIAL/PLATELET
Basophils Absolute: 0 10*3/uL (ref 0.0–0.1)
Basophils Relative: 0 % (ref 0–1)
EOS ABS: 0.3 10*3/uL (ref 0.0–0.7)
Eosinophils Relative: 6 % — ABNORMAL HIGH (ref 0–5)
HCT: 33.2 % — ABNORMAL LOW (ref 39.0–52.0)
Hemoglobin: 10.5 g/dL — ABNORMAL LOW (ref 13.0–17.0)
LYMPHS ABS: 1.7 10*3/uL (ref 0.7–4.0)
Lymphocytes Relative: 31 % (ref 12–46)
MCH: 26.8 pg (ref 26.0–34.0)
MCHC: 31.6 g/dL (ref 30.0–36.0)
MCV: 84.7 fL (ref 78.0–100.0)
Monocytes Absolute: 0.7 10*3/uL (ref 0.1–1.0)
Monocytes Relative: 14 % — ABNORMAL HIGH (ref 3–12)
Neutro Abs: 2.7 10*3/uL (ref 1.7–7.7)
Neutrophils Relative %: 49 % (ref 43–77)
Platelets: 213 10*3/uL (ref 150–400)
RBC: 3.92 MIL/uL — AB (ref 4.22–5.81)
RDW: 17.3 % — ABNORMAL HIGH (ref 11.5–15.5)
WBC: 5.5 10*3/uL (ref 4.0–10.5)

## 2014-01-17 LAB — I-STAT CHEM 8, ED
BUN: 50 mg/dL — AB (ref 6–23)
CREATININE: 15.7 mg/dL — AB (ref 0.50–1.35)
Calcium, Ion: 1.09 mmol/L — ABNORMAL LOW (ref 1.12–1.23)
Chloride: 110 mEq/L (ref 96–112)
Glucose, Bld: 78 mg/dL (ref 70–99)
HCT: 38 % — ABNORMAL LOW (ref 39.0–52.0)
Hemoglobin: 12.9 g/dL — ABNORMAL LOW (ref 13.0–17.0)
POTASSIUM: 5.3 meq/L (ref 3.7–5.3)
Sodium: 138 mEq/L (ref 137–147)
TCO2: 19 mmol/L (ref 0–100)

## 2014-01-17 NOTE — ED Notes (Signed)
Patient is a dialysis patient and goes T,Th,Sat and went to dialysis on Sat.

## 2014-01-17 NOTE — ED Notes (Signed)
Patient states that he has been having shortness of breath when he is sleeping.  He feels like he is breathing hard.  Patient states that it feels like anxiety with the shortness of breath.  Patient is CAOx3, 100% O2 sat in triage.  Patient is speaking in full sentences.

## 2014-01-18 ENCOUNTER — Encounter (HOSPITAL_COMMUNITY): Payer: Self-pay | Admitting: Emergency Medicine

## 2014-01-18 ENCOUNTER — Emergency Department (HOSPITAL_COMMUNITY)
Admission: EM | Admit: 2014-01-18 | Discharge: 2014-01-18 | Discharge status: Against Medical Advice | Payer: Medicare Other | Source: Home / Self Care | Attending: Emergency Medicine | Admitting: Emergency Medicine

## 2014-01-18 DIAGNOSIS — E8770 Fluid overload, unspecified: Secondary | ICD-10-CM

## 2014-01-18 DIAGNOSIS — N039 Chronic nephritic syndrome with unspecified morphologic changes: Secondary | ICD-10-CM

## 2014-01-18 DIAGNOSIS — N186 End stage renal disease: Secondary | ICD-10-CM | POA: Diagnosis present

## 2014-01-18 DIAGNOSIS — T8610 Unspecified complication of kidney transplant: Secondary | ICD-10-CM | POA: Diagnosis present

## 2014-01-18 DIAGNOSIS — Z9115 Patient's noncompliance with renal dialysis: Secondary | ICD-10-CM

## 2014-01-18 DIAGNOSIS — D631 Anemia in chronic kidney disease: Secondary | ICD-10-CM | POA: Diagnosis present

## 2014-01-18 DIAGNOSIS — I160 Hypertensive urgency: Secondary | ICD-10-CM

## 2014-01-18 DIAGNOSIS — Z79899 Other long term (current) drug therapy: Secondary | ICD-10-CM

## 2014-01-18 DIAGNOSIS — E119 Type 2 diabetes mellitus without complications: Secondary | ICD-10-CM | POA: Diagnosis present

## 2014-01-18 DIAGNOSIS — Z992 Dependence on renal dialysis: Secondary | ICD-10-CM

## 2014-01-18 DIAGNOSIS — Y830 Surgical operation with transplant of whole organ as the cause of abnormal reaction of the patient, or of later complication, without mention of misadventure at the time of the procedure: Secondary | ICD-10-CM | POA: Diagnosis present

## 2014-01-18 DIAGNOSIS — Z91158 Patient's noncompliance with renal dialysis for other reason: Secondary | ICD-10-CM

## 2014-01-18 DIAGNOSIS — Z87891 Personal history of nicotine dependence: Secondary | ICD-10-CM

## 2014-01-18 DIAGNOSIS — J9600 Acute respiratory failure, unspecified whether with hypoxia or hypercapnia: Secondary | ICD-10-CM | POA: Diagnosis present

## 2014-01-18 DIAGNOSIS — I120 Hypertensive chronic kidney disease with stage 5 chronic kidney disease or end stage renal disease: Principal | ICD-10-CM | POA: Diagnosis present

## 2014-01-18 DIAGNOSIS — Z91199 Patient's noncompliance with other medical treatment and regimen due to unspecified reason: Secondary | ICD-10-CM

## 2014-01-18 DIAGNOSIS — Z9119 Patient's noncompliance with other medical treatment and regimen: Secondary | ICD-10-CM

## 2014-01-18 LAB — CBC
HCT: 30.6 % — ABNORMAL LOW (ref 39.0–52.0)
HEMOGLOBIN: 9.6 g/dL — AB (ref 13.0–17.0)
MCH: 26.2 pg (ref 26.0–34.0)
MCHC: 31.4 g/dL (ref 30.0–36.0)
MCV: 83.6 fL (ref 78.0–100.0)
Platelets: 205 10*3/uL (ref 150–400)
RBC: 3.66 MIL/uL — ABNORMAL LOW (ref 4.22–5.81)
RDW: 16.8 % — AB (ref 11.5–15.5)
WBC: 6.3 10*3/uL (ref 4.0–10.5)

## 2014-01-18 LAB — BASIC METABOLIC PANEL
BUN: 69 mg/dL — ABNORMAL HIGH (ref 6–23)
CALCIUM: 8.4 mg/dL (ref 8.4–10.5)
CO2: 18 mEq/L — ABNORMAL LOW (ref 19–32)
Chloride: 100 mEq/L (ref 96–112)
Creatinine, Ser: 15.78 mg/dL — ABNORMAL HIGH (ref 0.50–1.35)
GFR calc Af Amer: 4 mL/min — ABNORMAL LOW (ref >90)
GFR, EST NON AFRICAN AMERICAN: 3 mL/min — AB (ref >90)
GLUCOSE: 109 mg/dL — AB (ref 70–99)
POTASSIUM: 5.5 meq/L — AB (ref 3.7–5.3)
SODIUM: 136 meq/L — AB (ref 137–147)

## 2014-01-18 LAB — COMPREHENSIVE METABOLIC PANEL
ALK PHOS: 77 U/L (ref 39–117)
ALT: 29 U/L (ref 0–53)
AST: 39 U/L — ABNORMAL HIGH (ref 0–37)
Albumin: 2.8 g/dL — ABNORMAL LOW (ref 3.5–5.2)
BILIRUBIN TOTAL: 0.3 mg/dL (ref 0.3–1.2)
BUN: 57 mg/dL — AB (ref 6–23)
CHLORIDE: 101 meq/L (ref 96–112)
CO2: 18 mEq/L — ABNORMAL LOW (ref 19–32)
Calcium: 8.6 mg/dL (ref 8.4–10.5)
Creatinine, Ser: 14.11 mg/dL — ABNORMAL HIGH (ref 0.50–1.35)
GFR calc non Af Amer: 4 mL/min — ABNORMAL LOW (ref >90)
GFR, EST AFRICAN AMERICAN: 4 mL/min — AB (ref >90)
Glucose, Bld: 61 mg/dL — ABNORMAL LOW (ref 70–99)
Potassium: 5.4 mEq/L — ABNORMAL HIGH (ref 3.7–5.3)
Sodium: 139 mEq/L (ref 137–147)
Total Protein: 7.2 g/dL (ref 6.0–8.3)

## 2014-01-18 LAB — CBG MONITORING, ED: Glucose-Capillary: 141 mg/dL — ABNORMAL HIGH (ref 70–99)

## 2014-01-18 LAB — I-STAT TROPONIN, ED: Troponin i, poc: 0.07 ng/mL (ref 0.00–0.08)

## 2014-01-18 LAB — PRO B NATRIURETIC PEPTIDE

## 2014-01-18 MED ORDER — LORAZEPAM 2 MG/ML IJ SOLN
0.5000 mg | Freq: Once | INTRAMUSCULAR | Status: AC
  Administered 2014-01-18: 0.5 mg via INTRAVENOUS

## 2014-01-18 MED ORDER — LORAZEPAM 2 MG/ML IJ SOLN
0.5000 mg | Freq: Once | INTRAMUSCULAR | Status: AC
Start: 2014-01-18 — End: 2014-01-18
  Administered 2014-01-18: 0.5 mg via INTRAVENOUS
  Filled 2014-01-18: qty 1

## 2014-01-18 MED ORDER — LABETALOL HCL 5 MG/ML IV SOLN
10.0000 mg | Freq: Once | INTRAVENOUS | Status: AC
  Administered 2014-01-18: 10 mg via INTRAVENOUS
  Filled 2014-01-18: qty 4

## 2014-01-18 MED ORDER — LABETALOL HCL 5 MG/ML IV SOLN
20.0000 mg | Freq: Once | INTRAVENOUS | Status: AC
  Administered 2014-01-18: 20 mg via INTRAVENOUS
  Filled 2014-01-18: qty 4

## 2014-01-18 MED ORDER — DEXTROSE 50 % IV SOLN
50.0000 mL | Freq: Once | INTRAVENOUS | Status: AC
  Administered 2014-01-18: 50 mL via INTRAVENOUS
  Filled 2014-01-18: qty 50

## 2014-01-18 NOTE — ED Notes (Signed)
Reported bp and reviewed that patient recently received 22m of labatelol IV to Dr. YLita Mains  MD acknowledges and gives verbal order for 248mof labetalol.

## 2014-01-18 NOTE — ED Notes (Signed)
Patient standing at nursing station.

## 2014-01-18 NOTE — ED Notes (Signed)
Patient back in hall after removing telemetry and monitors. Asked to go back to room. Patient now says he has to use the restroom.

## 2014-01-18 NOTE — ED Notes (Signed)
Discussed with the patient that he will need blood pressure managed prior to discharge. High readings are still present.

## 2014-01-18 NOTE — ED Notes (Signed)
Phlebotomy at the bedside.

## 2014-01-18 NOTE — ED Notes (Signed)
Dr. Yelverton at the bedside.  

## 2014-01-18 NOTE — ED Provider Notes (Signed)
CSN: [id number]     Arrival date & time 01/17/14  2205 History   First MD Initiated Contact with Patient 01/18/14 0033     Chief Complaint  Patient presents with  . Shortness of Breath  . Anxiety     (Consider location/radiation/quality/duration/timing/severity/associated sxs/prior Treatment) HPI Patient states he's had shortness of breath for the past 4 days. It is worse with while lying flat and with any exertion. He also is having anxiety which seems to exacerbate his shortness of breath. He admits to the lower extremity swelling. He is on hemodialysis and was last dialyzed on Saturday. He denies any chest pain at this time. He's had no fevers or chills. He denies any cough. Past Medical History  Diagnosis Date  . Hypertension   . ESRD (end stage renal disease)     due to HTN per patient, followed at [facility], s/p failed kidney transplant  . Depression   . Complication of anesthesia     itching, sore throat  . Diabetes mellitus without complication     No history per patient, but remains under history as A1c would not be accurate given on dialysis  . Shortness of breath   . Anxiety    Past Surgical History  Procedure Laterality Date  . Kidney receipient  2006    failed and started HD in March 2014  . Capd insertion    . Capd removal     No family history on file. History  Substance Use Topics  . Smoking status: Former Smoker -- 1.00 packs/day for 1 years    Types: Cigarettes  . Smokeless tobacco: Never Used     Comment: quit Jan 2014  . Alcohol Use: No    Review of Systems  Constitutional: Negative for fever and chills.  Respiratory: Positive for shortness of breath. Negative for cough and wheezing.   Cardiovascular: Positive for leg swelling. Negative for chest pain and palpitations.  Gastrointestinal: Negative for nausea, vomiting and abdominal pain.  Genitourinary: Negative for dysuria.  Musculoskeletal: Negative for back pain, neck pain and neck stiffness.   Skin: Negative for rash and wound.  Neurological: Negative for dizziness, weakness, light-headedness, numbness and headaches.  Psychiatric/Behavioral: The patient is nervous/anxious.   All other systems reviewed and are negative.     Allergies  Ferrlecit and Darvocet  Home Medications   Prior to Admission medications   Medication Sig Start Date End Date Taking? Authorizing Payge Eppes  allopurinol (ZYLOPRIM) 100 MG tablet Take 1 tablet (100 mg total) by mouth daily. 04/21/13   [name], MD  amLODipine (NORVASC) 5 MG tablet Take 10 mg by mouth daily.    Historical Lyfe Reihl, MD  cinacalcet (SENSIPAR) 30 MG tablet Take 30 mg by mouth daily.    Historical Gryffin Altice, MD  ciprofloxacin (CIPRO) 500 MG tablet Take 1 tablet (500 mg total) by mouth 2 (two) times daily. 12/21/13   Kalman Drape, MD  cloNIDine (CATAPRES) 0.3 MG tablet Take 1 tablet (0.3 mg total) by mouth 3 (three) times daily. 10/18/13   [name], MD  colchicine 0.6 MG tablet Take 0.6 mg by mouth daily as needed (for gout). 04/13/13   [name], MD  furosemide (LASIX) 80 MG tablet Take 80 mg by mouth daily.    Historical Waverley Krempasky, MD  labetalol (NORMODYNE) 200 MG tablet Take 300 mg by mouth 2 (two) times daily.     Historical Bryann Mcnealy, MD  lisinopril (PRINIVIL,ZESTRIL) 20 MG tablet Take 20 mg by mouth daily.  Historical Bain Whichard, MD  metroNIDAZOLE (FLAGYL) 500 MG tablet Take 1 tablet (500 mg total) by mouth 2 (two) times daily. 12/21/13   Kalman Drape, MD  minoxidil (LONITEN) 2.5 MG tablet Take 2.5 mg by mouth daily.  10/12/13   Historical Elis Rawlinson, MD  omeprazole (PRILOSEC) 20 MG capsule Take 20 mg by mouth daily. 07/01/13   Historical Darril Patriarca, MD  ondansetron (ZOFRAN ODT) 8 MG disintegrating tablet Take 1 tablet (8 mg total) by mouth every 8 (eight) hours as needed for nausea or vomiting. 12/05/13   Dorie Rank, MD  predniSONE (DELTASONE) 5 MG tablet Take 15 mg by mouth daily with breakfast.    Historical Molly Maselli, MD   promethazine (PHENERGAN) 25 MG tablet Take 1 tablet (25 mg total) by mouth every 6 (six) hours as needed for nausea. 12/21/13   Kalman Drape, MD  sevelamer carbonate (RENVELA) 800 MG tablet Take 1,600 mg by mouth 3 (three) times daily with meals. 12/27/13 12/27/14  Historical Thia Olesen, MD  tacrolimus (PROGRAF) 5 MG capsule Take 10 mg by mouth 2 (two) times daily.     Historical Faisal Stradling, MD   BP 193/139  Pulse 79  Temp(Src) 98 F (36.7 C) (Oral)  Resp 18  Ht _0  (1.854 m)  Wt 175 lb (79.379 kg)  BMI 23.09 kg/m2  SpO2 99% Physical Exam  Nursing note and vitals reviewed. Constitutional: He is oriented to person, place, and time. He appears well-developed and well-nourished. No distress.  HENT:  Head: Normocephalic and atraumatic.  Mouth/Throat: Oropharynx is clear and moist.  Eyes: EOM are normal. Pupils are equal, round, and reactive to light.  Neck: Normal range of motion. Neck supple.  Cardiovascular: Normal rate and regular rhythm.   Pulmonary/Chest: Effort normal and breath sounds normal. No respiratory distress. He has no wheezes. He has no rales.  Decreased breath sounds bilateral bases.  Abdominal: Soft. Bowel sounds are normal. He exhibits no distension and no mass. There is no tenderness. There is no rebound and no guarding.  Musculoskeletal: Normal range of motion. He exhibits edema. He exhibits no tenderness.  2+ bilateral pitting edema to lower extremities. No calf tenderness.  Neurological: He is alert and oriented to person, place, and time.  Skin: Skin is warm and dry. No rash noted. No erythema.  Psychiatric: He has a normal mood and affect. His behavior is normal.    ED Course  Procedures (including critical care time) Labs Review Labs Reviewed  CBC WITH DIFFERENTIAL - Abnormal; Notable for the following:    RBC 3.92 (*)    Hemoglobin 10.5 (*)    HCT 33.2 (*)    RDW 17.3 (*)    Monocytes Relative 14 (*)    Eosinophils Relative 6 (*)    All other components  within normal limits  I-STAT CHEM 8, ED - Abnormal; Notable for the following:    BUN 50 (*)    Creatinine, Ser 15.70 (*)    Calcium, Ion 1.09 (*)    Hemoglobin 12.9 (*)    HCT 38.0 (*)    All other components within normal limits  COMPREHENSIVE METABOLIC PANEL  PRO B NATRIURETIC PEPTIDE  I-STAT TROPOININ, ED    Imaging Review Dg Chest 2 View  01/17/2014   CLINICAL DATA:  Shortness of breath.  EXAM: CHEST  2 VIEW  COMPARISON:  01/11/2014  FINDINGS: Right chest wall dialysis catheter is noted with tips in the right atrium. Moderate cardiac enlargement. There is a small amount of fluid identified  within the minor and major fissure of the right lung. Pulmonary vascular congestion is noted. The visualized skeletal structures are unremarkable.  IMPRESSION: 1. Cardiac enlargement. Mild pleural fluid and pulmonary vascular congestion.   Electronically Signed   By: Kerby Moors M.D.   On: 01/17/2014 23:00     EKG Interpretation   Date/Time:  Monday January 17 2014 22:23:03 EDT Ventricular Rate:  80 PR Interval:  162 QRS Duration: 82 QT Interval:  400 QTC Calculation: 461 R Axis:   -32 Text Interpretation:  Normal sinus rhythm Possible Left atrial enlargement  Left axis deviation Left ventricular hypertrophy with repolarization  abnormality Abnormal ECG Confirmed by YELVERTON  MD, DAVID ([id number]) on  01/18/2014 4:27:11 AM      MDM   Final diagnoses:  None      Patient has persistently elevated blood pressure despite multiple doses of medication. He is refusing admission to the hospital. He is aware that some of the possible complications are stroke and death. The patient is competent to make these decisions for himself. He is pain-free she return immediately to the emergency department should he change his mind, had increased shortness of breath, chest pain, headache, focal weakness or any concerns.  [name], MD 01/18/14 [phone]

## 2014-01-18 NOTE — ED Notes (Signed)
Reported to Dr. Lita Mains that patient requests more medication to help him relax.  MD gives verbal order for 0.50m of ativan IV.

## 2014-01-18 NOTE — Discharge Instructions (Signed)
You need to be followed closely for your elevated blood pressure. Make sure you get dialysis this morning. You need to followup with your primary MD to assure improvement of your blood pressure. Should you change your mind about being admitted the hospital, return to the emergency department. You need to return to the emergency department for worsening shortness of breath, chest pain, weakness, headache or for any concerns   Hypertension Hypertension, commonly called high blood pressure, is when the force of blood pumping through your arteries is too strong. Your arteries are the blood vessels that carry blood from your heart throughout your body. A blood pressure reading consists of a higher number over a lower number, such as 110/72. The higher number (systolic) is the pressure inside your arteries when your heart pumps. The lower number (diastolic) is the pressure inside your arteries when your heart relaxes. Ideally you want your blood pressure below 120/80. Hypertension forces your heart to work harder to pump blood. Your arteries may become narrow or stiff. Having hypertension puts you at risk for heart disease, stroke, and other problems.  RISK FACTORS Some risk factors for high blood pressure are controllable. Others are not.  Risk factors you cannot control include:   Race. You may be at higher risk if you are African American.  Age. Risk increases with age.  Gender. Men are at higher risk than women before age 60 years. After age 16, women are at higher risk than men. Risk factors you can control include:  Not getting enough exercise or physical activity.  Being overweight.  Getting too much fat, sugar, calories, or salt in your diet.  Drinking too much alcohol. SIGNS AND SYMPTOMS Hypertension does not usually cause signs or symptoms. Extremely high blood pressure (hypertensive crisis) may cause headache, anxiety, shortness of breath, and nosebleed. DIAGNOSIS  To check if you  have hypertension, your health care provider will measure your blood pressure while you are seated, with your arm held at the level of your heart. It should be measured at least twice using the same arm. Certain conditions can cause a difference in blood pressure between your right and left arms. A blood pressure reading that is higher than normal on one occasion does not mean that you need treatment. If one blood pressure reading is high, ask your health care provider about having it checked again. TREATMENT  Treating high blood pressure includes making lifestyle changes and possibly taking medication. Living a healthy lifestyle can help lower high blood pressure. You may need to change some of your habits. Lifestyle changes may include:  Following the DASH diet. This diet is high in fruits, vegetables, and whole grains. It is low in salt, red meat, and added sugars.  Getting at least 2 1/2 hours of brisk physical activity every week.  Losing weight if necessary.  Not smoking.  Limiting alcoholic beverages.  Learning ways to reduce stress. If lifestyle changes are not enough to get your blood pressure under control, your health care provider may prescribe medicine. You may need to take more than one. Work closely with your health care provider to understand the risks and benefits. HOME CARE INSTRUCTIONS  Have your blood pressure rechecked as directed by your health care provider.   Only take medicine as directed by your health care provider. Follow the directions carefully. Blood pressure medicines must be taken as prescribed. The medicine does not work as well when you skip doses. Skipping doses also puts you at risk for  problems.   Do not smoke.   Monitor your blood pressure at home as directed by your health care provider. SEEK MEDICAL CARE IF:   You think you are having a reaction to medicines taken.  You have recurrent headaches or feel dizzy.  You have swelling in your  ankles.  You have trouble with your vision. SEEK IMMEDIATE MEDICAL CARE IF:  You develop a severe headache or confusion.  You have unusual weakness, numbness, or feel faint.  You have severe chest or abdominal pain.  You vomit repeatedly.  You have trouble breathing. MAKE SURE YOU:   Understand these instructions.  Will watch your condition.  Will get help right away if you are not doing well or get worse. Document Released: 07/08/2005 Document Revised: 07/13/2013 Document Reviewed: 04/30/2013 [facility] Patient Information 2015 [place], [place]. This information is not intended to replace advice given to you by your health care provider. Make sure you discuss any questions you have with your health care provider.

## 2014-01-18 NOTE — ED Notes (Signed)
Verified that patient is still in the restroom.

## 2014-01-18 NOTE — ED Notes (Signed)
Patient walking in hallway, asked to return to room to measure BP.

## 2014-01-18 NOTE — ED Notes (Signed)
Reported BP 203/148 to Dr. Lita Mains. MD acknowledges, will round on patient to discuss plan of care. No new orders at this time.

## 2014-01-18 NOTE — ED Notes (Signed)
Updated Dr. Lita Mains on patient's blood pressure. MD acknowledges and orders labetalol, and plan for admission.

## 2014-01-18 NOTE — ED Notes (Signed)
Patient here with complaint of shortness of breath starting today after eating lunch. ESRD present. Patient received partial dialysis treatment today, but didn't receive full treatment do to access problem. Only received about half of normal time on machine.

## 2014-01-19 ENCOUNTER — Encounter (HOSPITAL_COMMUNITY): Payer: Self-pay | Admitting: Internal Medicine

## 2014-01-19 ENCOUNTER — Inpatient Hospital Stay (HOSPITAL_COMMUNITY)
Admission: EM | Admit: 2014-01-19 | Discharge: 2014-01-19 | DRG: 682 | Discharge status: Against Medical Advice | Payer: Medicare Other | Attending: Internal Medicine | Admitting: Internal Medicine

## 2014-01-19 ENCOUNTER — Emergency Department (HOSPITAL_COMMUNITY): Payer: Medicare Other

## 2014-01-19 DIAGNOSIS — Z91199 Patient's noncompliance with other medical treatment and regimen due to unspecified reason: Secondary | ICD-10-CM | POA: Diagnosis not present

## 2014-01-19 DIAGNOSIS — Z9115 Patient's noncompliance with renal dialysis: Secondary | ICD-10-CM | POA: Diagnosis not present

## 2014-01-19 DIAGNOSIS — N039 Chronic nephritic syndrome with unspecified morphologic changes: Secondary | ICD-10-CM

## 2014-01-19 DIAGNOSIS — J9600 Acute respiratory failure, unspecified whether with hypoxia or hypercapnia: Secondary | ICD-10-CM | POA: Diagnosis present

## 2014-01-19 DIAGNOSIS — N179 Acute kidney failure, unspecified: Secondary | ICD-10-CM

## 2014-01-19 DIAGNOSIS — Z87891 Personal history of nicotine dependence: Secondary | ICD-10-CM | POA: Diagnosis not present

## 2014-01-19 DIAGNOSIS — Z992 Dependence on renal dialysis: Secondary | ICD-10-CM | POA: Diagnosis not present

## 2014-01-19 DIAGNOSIS — R0602 Shortness of breath: Secondary | ICD-10-CM | POA: Diagnosis present

## 2014-01-19 DIAGNOSIS — Y830 Surgical operation with transplant of whole organ as the cause of abnormal reaction of the patient, or of later complication, without mention of misadventure at the time of the procedure: Secondary | ICD-10-CM | POA: Diagnosis present

## 2014-01-19 DIAGNOSIS — I10 Essential (primary) hypertension: Secondary | ICD-10-CM

## 2014-01-19 DIAGNOSIS — D631 Anemia in chronic kidney disease: Secondary | ICD-10-CM | POA: Diagnosis present

## 2014-01-19 DIAGNOSIS — E119 Type 2 diabetes mellitus without complications: Secondary | ICD-10-CM | POA: Diagnosis present

## 2014-01-19 DIAGNOSIS — E8770 Fluid overload, unspecified: Secondary | ICD-10-CM

## 2014-01-19 DIAGNOSIS — N186 End stage renal disease: Secondary | ICD-10-CM | POA: Diagnosis present

## 2014-01-19 DIAGNOSIS — I120 Hypertensive chronic kidney disease with stage 5 chronic kidney disease or end stage renal disease: Secondary | ICD-10-CM | POA: Diagnosis present

## 2014-01-19 DIAGNOSIS — I161 Hypertensive emergency: Secondary | ICD-10-CM | POA: Diagnosis present

## 2014-01-19 DIAGNOSIS — Z79899 Other long term (current) drug therapy: Secondary | ICD-10-CM | POA: Diagnosis not present

## 2014-01-19 DIAGNOSIS — N189 Chronic kidney disease, unspecified: Secondary | ICD-10-CM

## 2014-01-19 DIAGNOSIS — T8610 Unspecified complication of kidney transplant: Secondary | ICD-10-CM | POA: Diagnosis present

## 2014-01-19 DIAGNOSIS — N184 Chronic kidney disease, stage 4 (severe): Secondary | ICD-10-CM

## 2014-01-19 LAB — COMPREHENSIVE METABOLIC PANEL
ALBUMIN: 2.8 g/dL — AB (ref 3.5–5.2)
ALK PHOS: 80 U/L (ref 39–117)
ALT: 42 U/L (ref 0–53)
AST: 46 U/L — ABNORMAL HIGH (ref 0–37)
BUN: 72 mg/dL — ABNORMAL HIGH (ref 6–23)
CO2: 18 mEq/L — ABNORMAL LOW (ref 19–32)
Calcium: 8.2 mg/dL — ABNORMAL LOW (ref 8.4–10.5)
Chloride: 100 mEq/L (ref 96–112)
Creatinine, Ser: 15.96 mg/dL — ABNORMAL HIGH (ref 0.50–1.35)
GFR calc Af Amer: 4 mL/min — ABNORMAL LOW (ref >90)
GFR calc non Af Amer: 3 mL/min — ABNORMAL LOW (ref >90)
Glucose, Bld: 100 mg/dL — ABNORMAL HIGH (ref 70–99)
POTASSIUM: 5.5 meq/L — AB (ref 3.7–5.3)
SODIUM: 137 meq/L (ref 137–147)
Total Bilirubin: 0.3 mg/dL (ref 0.3–1.2)
Total Protein: 7.1 g/dL (ref 6.0–8.3)

## 2014-01-19 LAB — CBC WITH DIFFERENTIAL/PLATELET
BASOS PCT: 0 % (ref 0–1)
Basophils Absolute: 0 10*3/uL (ref 0.0–0.1)
EOS PCT: 4 % (ref 0–5)
Eosinophils Absolute: 0.2 10*3/uL (ref 0.0–0.7)
HEMATOCRIT: 32.4 % — AB (ref 39.0–52.0)
Hemoglobin: 10.1 g/dL — ABNORMAL LOW (ref 13.0–17.0)
Lymphocytes Relative: 29 % (ref 12–46)
Lymphs Abs: 1.6 10*3/uL (ref 0.7–4.0)
MCH: 26.9 pg (ref 26.0–34.0)
MCHC: 31.2 g/dL (ref 30.0–36.0)
MCV: 86.2 fL (ref 78.0–100.0)
MONO ABS: 0.7 10*3/uL (ref 0.1–1.0)
Monocytes Relative: 12 % (ref 3–12)
Neutro Abs: 3.1 10*3/uL (ref 1.7–7.7)
Neutrophils Relative %: 55 % (ref 43–77)
Platelets: 220 10*3/uL (ref 150–400)
RBC: 3.76 MIL/uL — ABNORMAL LOW (ref 4.22–5.81)
RDW: 17.2 % — AB (ref 11.5–15.5)
WBC: 5.6 10*3/uL (ref 4.0–10.5)

## 2014-01-19 LAB — MRSA PCR SCREENING: MRSA by PCR: NEGATIVE

## 2014-01-19 LAB — GLUCOSE, CAPILLARY: Glucose-Capillary: 124 mg/dL — ABNORMAL HIGH (ref 70–99)

## 2014-01-19 LAB — TROPONIN I: Troponin I: <0.3 ng/mL (ref <0.30)

## 2014-01-19 MED ORDER — SODIUM CHLORIDE 0.9 % IV SOLN: 100.0000 mL | INTRAVENOUS | Status: DC | PRN

## 2014-01-19 MED ORDER — ALLOPURINOL 100 MG PO TABS
100.0000 mg | ORAL_TABLET | Freq: Every day | ORAL | Status: DC
  Administered 2014-01-19: 100 mg via ORAL
  Filled 2014-01-19: qty 1

## 2014-01-19 MED ORDER — SODIUM CHLORIDE 0.9 % IJ SOLN
3.0000 mL | Freq: Two times a day (BID) | INTRAMUSCULAR | Status: DC
  Administered 2014-01-19: 3 mL via INTRAVENOUS

## 2014-01-19 MED ORDER — ACETAMINOPHEN 650 MG RE SUPP: 650.0000 mg | Freq: Four times a day (QID) | RECTAL | Status: DC | PRN

## 2014-01-19 MED ORDER — ONDANSETRON HCL 4 MG PO TABS: 4.0000 mg | ORAL_TABLET | Freq: Four times a day (QID) | ORAL | Status: DC | PRN

## 2014-01-19 MED ORDER — LIDOCAINE HCL (PF) 1 % IJ SOLN: 5.0000 mL | INTRAMUSCULAR | Status: DC | PRN

## 2014-01-19 MED ORDER — SEVELAMER CARBONATE 800 MG PO TABS
1600.0000 mg | ORAL_TABLET | Freq: Three times a day (TID) | ORAL | Status: DC
  Administered 2014-01-19 (×2): 1600 mg via ORAL
  Filled 2014-01-19 (×4): qty 2

## 2014-01-19 MED ORDER — NEPRO/CARBSTEADY PO LIQD
237.0000 mL | ORAL | Status: DC | PRN
  Filled 2014-01-19: qty 237

## 2014-01-19 MED ORDER — HEPARIN SODIUM (PORCINE) 1000 UNIT/ML DIALYSIS: 1000.0000 [IU] | INTRAMUSCULAR | Status: DC | PRN

## 2014-01-19 MED ORDER — LABETALOL HCL 300 MG PO TABS
300.0000 mg | ORAL_TABLET | Freq: Two times a day (BID) | ORAL | Status: DC
  Administered 2014-01-19: 300 mg via ORAL
  Filled 2014-01-19 (×2): qty 1

## 2014-01-19 MED ORDER — TACROLIMUS 1 MG PO CAPS
10.0000 mg | ORAL_CAPSULE | Freq: Two times a day (BID) | ORAL | Status: DC
  Administered 2014-01-19: 10 mg via ORAL
  Filled 2014-01-19 (×2): qty 10

## 2014-01-19 MED ORDER — LABETALOL HCL 5 MG/ML IV SOLN
5.0000 mg | Freq: Once | INTRAVENOUS | Status: AC
  Administered 2014-01-19: 5 mg via INTRAVENOUS
  Filled 2014-01-19: qty 4

## 2014-01-19 MED ORDER — AMLODIPINE BESYLATE 10 MG PO TABS
10.0000 mg | ORAL_TABLET | Freq: Every day | ORAL | Status: DC
  Administered 2014-01-19: 10 mg via ORAL
  Filled 2014-01-19: qty 1

## 2014-01-19 MED ORDER — AMLODIPINE BESYLATE 5 MG PO TABS
10.0000 mg | ORAL_TABLET | Freq: Once | ORAL | Status: AC
  Administered 2014-01-19: 10 mg via ORAL
  Filled 2014-01-19: qty 2

## 2014-01-19 MED ORDER — PENTAFLUOROPROP-TETRAFLUOROETH EX AERO: 1.0000 "application " | INHALATION_SPRAY | CUTANEOUS | Status: DC | PRN

## 2014-01-19 MED ORDER — ACETAMINOPHEN 325 MG PO TABS: 650.0000 mg | ORAL_TABLET | Freq: Four times a day (QID) | ORAL | Status: DC | PRN

## 2014-01-19 MED ORDER — ALTEPLASE 2 MG IJ SOLR
2.0000 mg | Freq: Once | INTRAMUSCULAR | Status: DC | PRN
  Filled 2014-01-19: qty 2

## 2014-01-19 MED ORDER — MINOXIDIL 2.5 MG PO TABS
2.5000 mg | ORAL_TABLET | Freq: Every day | ORAL | Status: DC
  Administered 2014-01-19: 2.5 mg via ORAL
  Filled 2014-01-19: qty 1

## 2014-01-19 MED ORDER — CINACALCET HCL 30 MG PO TABS
30.0000 mg | ORAL_TABLET | Freq: Two times a day (BID) | ORAL | Status: DC
  Administered 2014-01-19: 30 mg via ORAL
  Filled 2014-01-19 (×3): qty 1

## 2014-01-19 MED ORDER — FUROSEMIDE 80 MG PO TABS
80.0000 mg | ORAL_TABLET | Freq: Every day | ORAL | Status: DC
  Administered 2014-01-19: 80 mg via ORAL
  Filled 2014-01-19: qty 1

## 2014-01-19 MED ORDER — LABETALOL HCL 5 MG/ML IV SOLN
10.0000 mg | INTRAVENOUS | Status: DC | PRN
  Administered 2014-01-19 (×2): 10 mg via INTRAVENOUS
  Filled 2014-01-19 (×2): qty 4

## 2014-01-19 MED ORDER — HEPARIN SODIUM (PORCINE) 1000 UNIT/ML DIALYSIS
100.0000 [IU]/kg | INTRAMUSCULAR | Status: DC | PRN
  Filled 2014-01-19: qty 9

## 2014-01-19 MED ORDER — CLONIDINE HCL 0.3 MG PO TABS
0.3000 mg | ORAL_TABLET | Freq: Three times a day (TID) | ORAL | Status: DC
  Administered 2014-01-19: 0.3 mg via ORAL
  Filled 2014-01-19 (×3): qty 1

## 2014-01-19 MED ORDER — HEPARIN SODIUM (PORCINE) 5000 UNIT/ML IJ SOLN
5000.0000 [IU] | Freq: Three times a day (TID) | INTRAMUSCULAR | Status: DC
  Administered 2014-01-19: 5000 [IU] via SUBCUTANEOUS
  Filled 2014-01-19 (×4): qty 1

## 2014-01-19 MED ORDER — LIDOCAINE-PRILOCAINE 2.5-2.5 % EX CREA
1.0000 "application " | TOPICAL_CREAM | CUTANEOUS | Status: DC | PRN
  Filled 2014-01-19: qty 5

## 2014-01-19 MED ORDER — PROMETHAZINE HCL 25 MG PO TABS: 25.0000 mg | ORAL_TABLET | Freq: Four times a day (QID) | ORAL | Status: DC | PRN

## 2014-01-19 MED ORDER — ONDANSETRON HCL 4 MG/2ML IJ SOLN: 4.0000 mg | Freq: Four times a day (QID) | INTRAMUSCULAR | Status: DC | PRN

## 2014-01-19 MED ORDER — LORAZEPAM 1 MG PO TABS
1.0000 mg | ORAL_TABLET | Freq: Once | ORAL | Status: AC
  Administered 2014-01-19: 1 mg via ORAL
  Filled 2014-01-19: qty 1

## 2014-01-19 MED ORDER — LISINOPRIL 20 MG PO TABS
20.0000 mg | ORAL_TABLET | Freq: Every day | ORAL | Status: DC
  Administered 2014-01-19: 20 mg via ORAL
  Filled 2014-01-19: qty 1

## 2014-01-19 MED ORDER — PANTOPRAZOLE SODIUM 40 MG PO TBEC: 40.0000 mg | DELAYED_RELEASE_TABLET | Freq: Every day | ORAL | Status: DC

## 2014-01-19 MED ORDER — COLCHICINE 0.6 MG PO TABS
0.6000 mg | ORAL_TABLET | Freq: Every day | ORAL | Status: DC | PRN
  Filled 2014-01-19: qty 1

## 2014-01-19 NOTE — Progress Notes (Signed)
Patient left hospital against medical advice ,Pt was instructed of risks of BP and not completing HD.Dr Tana Coast called and talked with patient via phone..Patient refused to sign form for leaving hospital against medical advice.

## 2014-01-19 NOTE — Progress Notes (Signed)
Patient received from HD after Frank [name] decided to stop his treatment after 2 hours and 13 minutes. Report received from [place], [place]. Patient transported back to 2S02 after his treatment, refusing to wear monitoring equipment at this time.

## 2014-01-19 NOTE — Consult Note (Signed)
[facility] Renal Consultation Note    Indication for Consultation:  Management of ESRD/hemodialysis; anemia, hypertension/volume and secondary hyperparathyroidism  HPI: Frank [name] is a 48 y.o. male.   Pt is a 48yo AAM with PMH sig for DM, HTN, ESRD s/p failed kidney transplant, and medical noncompliance who presented to [facility] ED with SOB after missing his last 2 dialysis treatments.  I contacted his HD unit and he has only been to 4 treatments over the last 3 weeks and his last dialysis was 6/27 and he signed off early.  He has had ongoing noncompliance and now with elevated BP and pulm edema.  The patient lied to the ED physicians stating that he went to dialysis and had trouble with his graft (he didn't go and doesn't have a functional graft).  We were asked to help manage his ESRD and pulmonary edema/HTN  Past Medical History  Diagnosis Date  . Hypertension   . ESRD (end stage renal disease)     due to HTN per patient, followed at [facility], s/p failed kidney transplant  . Depression   . Complication of anesthesia     itching, sore throat  . Diabetes mellitus without complication     No history per patient, but remains under history as A1c would not be accurate given on dialysis  . Shortness of breath   . Anxiety    Past Surgical History  Procedure Laterality Date  . Kidney receipient  2006    failed and started HD in March 2014  . Capd insertion    . Capd removal     Family History:   History reviewed. No pertinent family history. Social History:  reports that he has quit smoking. His smoking use included Cigarettes. He has a 1 pack-year smoking history. He has never used smokeless tobacco. He reports that he does not drink alcohol or use illicit drugs. Allergies  Allergen Reactions  . Ferrlecit [Na Ferric Gluc Cplx In Sucrose] Shortness Of Breath, Swelling and Other (See Comments)    Swelling in throat  . Darvocet [Propoxyphene N-Acetaminophen] Hives   Prior  to Admission medications   Medication Sig Start Date End Date Taking? Authorizing Provider  allopurinol (ZYLOPRIM) 100 MG tablet Take 100 mg by mouth daily.   Yes Historical Provider, MD  amLODipine (NORVASC) 5 MG tablet Take 10 mg by mouth daily.   Yes Historical Provider, MD  cinacalcet (SENSIPAR) 30 MG tablet Take 30 mg by mouth daily.   Yes Historical Provider, MD  ciprofloxacin (CIPRO) 500 MG tablet Take 1 tablet (500 mg total) by mouth 2 (two) times daily. 12/21/13  Yes Kalman Drape, MD  cloNIDine (CATAPRES) 0.3 MG tablet Take 0.3 mg by mouth 3 (three) times daily.   Yes Historical Provider, MD  colchicine 0.6 MG tablet Take 0.6 mg by mouth daily as needed (for gout). 04/13/13  Yes [name], MD  furosemide (LASIX) 80 MG tablet Take 80 mg by mouth daily.   Yes Historical Provider, MD  labetalol (NORMODYNE) 200 MG tablet Take 300 mg by mouth 2 (two) times daily.    Yes Historical Provider, MD  lisinopril (PRINIVIL,ZESTRIL) 20 MG tablet Take 20 mg by mouth daily.   Yes Historical Provider, MD  metroNIDAZOLE (FLAGYL) 500 MG tablet Take 500 mg by mouth 2 (two) times daily.   Yes Historical Provider, MD  omeprazole (PRILOSEC) 20 MG capsule Take 20 mg by mouth daily. 07/01/13  Yes Historical Provider, MD  predniSONE (DELTASONE) 5  MG tablet Take 15 mg by mouth daily with breakfast.   Yes Historical Provider, MD  promethazine (PHENERGAN) 25 MG tablet Take 1 tablet (25 mg total) by mouth every 6 (six) hours as needed for nausea. 12/21/13  Yes Kalman Drape, MD  sevelamer carbonate (RENVELA) 800 MG tablet Take 1,600 mg by mouth 3 (three) times daily with meals. 12/27/13 12/27/14 Yes Historical Provider, MD  tacrolimus (PROGRAF) 5 MG capsule Take 10 mg by mouth 2 (two) times daily.    Yes Historical Provider, MD  minoxidil (LONITEN) 2.5 MG tablet Take 2.5 mg by mouth daily.  10/12/13   Historical Provider, MD   Current Facility-Administered Medications  Medication Dose Route Frequency Provider Last Rate  Last Dose  . acetaminophen (TYLENOL) tablet 650 mg  650 mg Oral Q6H PRN [name], MD       Or  . acetaminophen (TYLENOL) suppository 650 mg  650 mg Rectal Q6H PRN [name], MD      . allopurinol (ZYLOPRIM) tablet 100 mg  100 mg Oral Daily [name], MD      . amLODipine (NORVASC) tablet 10 mg  10 mg Oral Daily [name], MD      . cinacalcet (SENSIPAR) tablet 30 mg  30 mg Oral BID WC [name], MD   30 mg at 01/19/14 0826  . cloNIDine (CATAPRES) tablet 0.3 mg  0.3 mg Oral TID [name], MD      . colchicine tablet 0.6 mg  0.6 mg Oral Daily PRN [name], MD      . furosemide (LASIX) tablet 80 mg  80 mg Oral Daily [name], MD      . heparin injection 5,000 Units  5,000 Units Subcutaneous 3 times per day [name], MD   5,000 Units at 01/19/14 0719  . labetalol (NORMODYNE) tablet 300 mg  300 mg Oral BID [name], MD   300 mg at 01/19/14 0718  . labetalol (NORMODYNE,TRANDATE) injection 10 mg  10 mg Intravenous Q2H PRN [name], MD   10 mg at 01/19/14 0905  . lisinopril (PRINIVIL,ZESTRIL) tablet 20 mg  20 mg Oral Daily [name], MD      . minoxidil (LONITEN) tablet 2.5 mg  2.5 mg Oral Daily [name], MD      . ondansetron [facility]) tablet 4 mg  4 mg Oral Q6H PRN [name], MD       Or  . ondansetron [facility]) injection 4 mg  4 mg Intravenous Q6H PRN [name], MD      . pantoprazole (PROTONIX) EC tablet 40 mg  40 mg Oral Daily [name], MD      . promethazine (PHENERGAN) tablet 25 mg  25 mg Oral Q6H PRN [name], MD      . sevelamer carbonate (RENVELA) tablet 1,600 mg  1,600 mg Oral TID WC [name], MD   1,600 mg at 01/19/14 0826  . sodium chloride 0.9 % injection 3 mL  3 mL Intravenous Q12H [name], MD   3 mL at 01/19/14 0918  . tacrolimus (PROGRAF) capsule 10 mg  10 mg Oral BID [name],  MD       Labs: Basic Metabolic Panel:  Recent Labs Lab 01/18/14 0048 01/18/14 2125 01/19/14 0703  NA 139 136* 137  K 5.4* 5.5* 5.5*  CL 101 100 100  CO2 18* 18*  18*  GLUCOSE 61* 109* 100*  BUN 57* 69* 72*  CREATININE 14.11* 15.78* 15.96*  CALCIUM 8.6 8.4 8.2*   Liver Function Tests:  Recent Labs Lab 01/18/14 0048 01/19/14 0703  AST 39* 46*  ALT 29 42  ALKPHOS 77 80  BILITOT 0.3 0.3  PROT 7.2 7.1  ALBUMIN 2.8* 2.8*   No results found for this basename: LIPASE, AMYLASE,  in the last 168 hours No results found for this basename: AMMONIA,  in the last 168 hours CBC:  Recent Labs Lab 01/17/14 2234 01/17/14 2239 01/18/14 2125 01/19/14 0703  WBC 5.5  --  6.3 5.6  NEUTROABS 2.7  --   --  3.1  HGB 10.5* 12.9* 9.6* 10.1*  HCT 33.2* 38.0* 30.6* 32.4*  MCV 84.7  --  83.6 86.2  PLT 213  --  205 220   Cardiac Enzymes:  Recent Labs Lab 01/19/14 0051  TROPONINI <0.30   CBG:  Recent Labs Lab 01/18/14 0420 01/19/14 0811  GLUCAP 141* 124*   Iron Studies: No results found for this basename: IRON, TIBC, TRANSFERRIN, FERRITIN,  in the last 72 hours Studies/Results: Dg Chest 2 View  01/19/2014   CLINICAL DATA:  Shortness of breath.  Insomnia.  Anxiety.  EXAM: CHEST  2 VIEW  COMPARISON:  01/17/2014.  FINDINGS: Right IJ dialysis catheter is in stable position, tip at the upper right atrial level. There is chronic cardiomegaly. Unremarkable upper mediastinal contours.  Chronic pulmonary venous congestion. No edema, consolidation, effusion, or pneumothorax.  IMPRESSION: Cardiomegaly and chronic venous congestion.  No edema or pneumonia.   Electronically Signed   By: Jorje Guild M.D.   On: 01/19/2014 02:16   Dg Chest 2 View  01/17/2014   CLINICAL DATA:  Shortness of breath.  EXAM: CHEST  2 VIEW  COMPARISON:  01/11/2014  FINDINGS: Right chest wall dialysis catheter is noted with tips in the right atrium. Moderate cardiac enlargement. There is a small amount of fluid  identified within the minor and major fissure of the right lung. Pulmonary vascular congestion is noted. The visualized skeletal structures are unremarkable.  IMPRESSION: 1. Cardiac enlargement. Mild pleural fluid and pulmonary vascular congestion.   Electronically Signed   By: Kerby Moors M.D.   On: 01/17/2014 23:00    ROS: Pertinent items are noted in HPI. Physical Exam: Filed Vitals:   01/19/14 0800 01/19/14 0814 01/19/14 0900 01/19/14 1000  BP: 173/126  175/121 173/118  Pulse: 72  69 68  Temp:  98.1 F (36.7 C)    TempSrc:  Oral    Resp: _0 Height:      Weight:      SpO2: 96%  100% 100%      Weight change:   Intake/Output Summary (Last 24 hours) at 01/19/14 1052 Last data filed at 01/19/14 0900  Gross per 24 hour  Intake    260 ml  Output      1 ml  Net    259 ml   BP 173/118  Pulse 68  Temp(Src) 98.1 F (36.7 C) (Oral)  Resp 14  Ht 6' 1.5" (1.867 m)  Wt 82.373 kg (181 lb 9.6 oz)  BMI 23.63 kg/m2  SpO2 100% General appearance: alert, combative and angry that he hasn't had dialysis yet Head: Normocephalic, without obvious abnormality, atraumatic Resp: rales bilaterally Cardio: regular rate and rhythm and no rub GI: soft, non-tender; bowel sounds normal; no masses,  no organomegaly Extremities: edema trace Dialysis Access:  Dialysis  Orders: Center: [facility]  on TTS . EDW 77kg HD Bath 2K/2.5Ca  Time 4hrs Heparin 3400 bolus 500 hourly. Access [facility] (refuses AVF/AVG) BFR 400 DFR 800    Hectoral 4 mcg IV/HD on Tue and Sat, 59mg on Thur Aranesp 1277m qweek   Assessment/Plan: 1.  pulmonary edema- due to nonadherence with HD.  Plan for HD with UF 2.  ESRD -  Normally TTS but has been noncompliant with dialysis and skipped his last 2 treatments and repeatedly signs off early.  Plan for HD today and stressed the importance of compliance with HD to reduce his risk for hospitalization and/or sudden death. 3.  Hypertension/volume  - plan for UF and stressed compliance  with HD 4. Hyperkalemia- due to noncompliance, plan for HD 5.  Anemia  - aranesp to be given at his outpt unit tomorrow 6.  Metabolic bone disease -  Cont with outpt meds 7.  Vascular access- per his home HD unit, pt refuses permanent access 8. Dispo- should be stable for discharge after dialysis and [facility].  JoDonetta PottsMD, [phone]/07/2013, 10:52 AM

## 2014-01-19 NOTE — ED Notes (Signed)
Pt continuously walking the hallways. Pt has been asked multiple times to stay in his room but he states that walking helps him breath better. Pt keeps coming to the desk stating he has been here since 9pm and everyone keeps asking him to be pt, if we aren't going to give him any medications then he could have just stayed at home. Pt went back in room and stated he will wait a few more minutes and if nothing happens he will go home. MD made aware.

## 2014-01-19 NOTE — Progress Notes (Signed)
Patient to HD via bed with monitor and RN

## 2014-01-19 NOTE — Progress Notes (Signed)
Patient leaving against medical advice after waiting on hemodialysis .Veronica Camera operator in hemodialysis stated it would be later this afternoon before he can receive hemodialysis after HD staff had stated he would be started by noon. Dr Tana [facility] notified .orders to give  Antihypertensives before he leaves.Informed patient of risk of stroke and death.

## 2014-01-19 NOTE — Progress Notes (Signed)
Patient seen and examined, admitted by Dr. Hal Hope this morning.  Briefly 48 year old male with ESRD on HD TTS, presented with hypertensive emergency and shortness of breath likely due to pulmonary edema. Patient had not completed his dialysis yesterday as his graft was clotted.  BP 186/139  Pulse 72  Temp(Src) 97.5 F (36.4 C) (Oral)  Resp 21  Ht 6' 1.5" (1.867 m)  Wt 82.373 kg (181 lb 9.6 oz)  BMI 23.63 kg/m2  SpO2 98%  Acute respiratory failure secondary to pulmonary edema, ESRD on dialysis - I have called nephrology again this morning for urgent hemodialysis today  Hypertensive emergency - Somewhat improved from admission, restarted oral antihypertensives, will likely mprove with hemodialysis  Will follow   Frank [name] M.D. [facility] 01/19/2014, 7:35 AM  Pager: [phone]

## 2014-01-19 NOTE — Progress Notes (Signed)
Devon Lofters RN talked with patient and HD .Pt agreed to stay for treatment at 1330

## 2014-01-19 NOTE — ED Provider Notes (Signed)
CSN: [id number]     Arrival date & time 01/18/14  2108 History   First MD Initiated Contact with Patient 01/19/14 0030     Chief Complaint  Patient presents with  . Shortness of Breath  . Anxiety  . Insomnia     (Consider location/radiation/quality/duration/timing/severity/associated sxs/prior Treatment) HPI  This is a 48 year old male with history of end-stage renal disease, hypertension, diabetes who presents with shortness of breath. He was seen earlier today for the same. Notably hypertensive during his stay in the ER earlier and was offered admission. Patient declined. She states that he went to dialysis earlier today and was not able to finish the session because of "my line was clogged." He reports progressive shortness of breath since that time. He reports orthopnea and dyspnea on exertion. He denies any cough or fevers. He has not taken his blood pressure medications today.  Past Medical History  Diagnosis Date  . Hypertension   . ESRD (end stage renal disease)     due to HTN per patient, followed at [facility], s/p failed kidney transplant  . Depression   . Complication of anesthesia     itching, sore throat  . Diabetes mellitus without complication     No history per patient, but remains under history as A1c would not be accurate given on dialysis  . Shortness of breath   . Anxiety    Past Surgical History  Procedure Laterality Date  . Kidney receipient  2006    failed and started HD in March 2014  . Capd insertion    . Capd removal     History reviewed. No pertinent family history. History  Substance Use Topics  . Smoking status: Former Smoker -- 1.00 packs/day for 1 years    Types: Cigarettes  . Smokeless tobacco: Never Used     Comment: quit Jan 2014  . Alcohol Use: No    Review of Systems  Constitutional: Negative.  Negative for fever.  Respiratory: Positive for shortness of breath. Negative for cough and chest tightness.   Cardiovascular: Positive for leg  swelling. Negative for chest pain.  Gastrointestinal: Negative.  Negative for abdominal pain.  Genitourinary: Negative.  Negative for dysuria.  Neurological: Negative for headaches.  All other systems reviewed and are negative.     Allergies  Ferrlecit and Darvocet  Home Medications   Prior to Admission medications   Medication Sig Start Date End Date Taking? Authorizing Provider  allopurinol (ZYLOPRIM) 100 MG tablet Take 100 mg by mouth daily.   Yes Historical Provider, MD  amLODipine (NORVASC) 5 MG tablet Take 10 mg by mouth daily.   Yes Historical Provider, MD  cinacalcet (SENSIPAR) 30 MG tablet Take 30 mg by mouth daily.   Yes Historical Provider, MD  ciprofloxacin (CIPRO) 500 MG tablet Take 1 tablet (500 mg total) by mouth 2 (two) times daily. 12/21/13  Yes Kalman Drape, MD  cloNIDine (CATAPRES) 0.3 MG tablet Take 0.3 mg by mouth 3 (three) times daily.   Yes Historical Provider, MD  colchicine 0.6 MG tablet Take 0.6 mg by mouth daily as needed (for gout). 04/13/13  Yes [name], MD  furosemide (LASIX) 80 MG tablet Take 80 mg by mouth daily.   Yes Historical Provider, MD  labetalol (NORMODYNE) 200 MG tablet Take 300 mg by mouth 2 (two) times daily.    Yes Historical Provider, MD  lisinopril (PRINIVIL,ZESTRIL) 20 MG tablet Take 20 mg by mouth daily.   Yes Historical Provider, MD  metroNIDAZOLE (FLAGYL) 500 MG tablet Take 500 mg by mouth 2 (two) times daily.   Yes Historical Provider, MD  omeprazole (PRILOSEC) 20 MG capsule Take 20 mg by mouth daily. 07/01/13  Yes Historical Provider, MD  predniSONE (DELTASONE) 5 MG tablet Take 15 mg by mouth daily with breakfast.   Yes Historical Provider, MD  promethazine (PHENERGAN) 25 MG tablet Take 1 tablet (25 mg total) by mouth every 6 (six) hours as needed for nausea. 12/21/13  Yes Kalman Drape, MD  sevelamer carbonate (RENVELA) 800 MG tablet Take 1,600 mg by mouth 3 (three) times daily with meals. 12/27/13 12/27/14 Yes Historical Provider, MD   tacrolimus (PROGRAF) 5 MG capsule Take 10 mg by mouth 2 (two) times daily.    Yes Historical Provider, MD  minoxidil (LONITEN) 2.5 MG tablet Take 2.5 mg by mouth daily.  10/12/13   Historical Provider, MD   BP 186/139  Pulse 72  Temp(Src) 97.5 F (36.4 C) (Oral)  Resp 21  Ht 6' 1.5" (1.867 m)  Wt 181 lb 9.6 oz (82.373 kg)  BMI 23.63 kg/m2  SpO2 98% Physical Exam  Nursing note and vitals reviewed. Constitutional: He is oriented to person, place, and time. He appears well-developed and well-nourished.  HENT:  Head: Normocephalic and atraumatic.  Eyes: Pupils are equal, round, and reactive to light.  Neck: Neck supple. No JVD present.  Cardiovascular: Normal rate, regular rhythm and normal heart sounds.   No murmur heard. Pulmonary/Chest: Effort normal. No respiratory distress. He has no wheezes. He exhibits no tenderness.  Final crackles in the bases, tunneled catheter in the right subclavian  Abdominal: Soft. Bowel sounds are normal. There is no tenderness. There is no rebound.  Musculoskeletal: He exhibits edema.  1+ edema bilaterally  Lymphadenopathy:    He has no cervical adenopathy.  Neurological: He is alert and oriented to person, place, and time.  Skin: Skin is warm and dry.  Psychiatric: He has a normal mood and affect.    ED Course  Procedures (including critical care time) Labs Review Labs Reviewed  CBC - Abnormal; Notable for the following:    RBC 3.66 (*)    Hemoglobin 9.6 (*)    HCT 30.6 (*)    RDW 16.8 (*)    All other components within normal limits  BASIC METABOLIC PANEL - Abnormal; Notable for the following:    Sodium 136 (*)    Potassium 5.5 (*)    CO2 18 (*)    Glucose, Bld 109 (*)    BUN 69 (*)    Creatinine, Ser 15.78 (*)    GFR calc non Af Amer 3 (*)    GFR calc Af Amer 4 (*)    All other components within normal limits  MRSA PCR SCREENING  TROPONIN I  COMPREHENSIVE METABOLIC PANEL  CBC WITH DIFFERENTIAL  CBC    Imaging Review Dg  Chest 2 View  01/19/2014   CLINICAL DATA:  Shortness of breath.  Insomnia.  Anxiety.  EXAM: CHEST  2 VIEW  COMPARISON:  01/17/2014.  FINDINGS: Right IJ dialysis catheter is in stable position, tip at the upper right atrial level. There is chronic cardiomegaly. Unremarkable upper mediastinal contours.  Chronic pulmonary venous congestion. No edema, consolidation, effusion, or pneumothorax.  IMPRESSION: Cardiomegaly and chronic venous congestion.  No edema or pneumonia.   Electronically Signed   By: Jorje Guild M.D.   On: 01/19/2014 02:16   Dg Chest 2 View  01/17/2014   CLINICAL DATA:  Shortness of breath.  EXAM: CHEST  2 VIEW  COMPARISON:  01/11/2014  FINDINGS: Right chest wall dialysis catheter is noted with tips in the right atrium. Moderate cardiac enlargement. There is a small amount of fluid identified within the minor and major fissure of the right lung. Pulmonary vascular congestion is noted. The visualized skeletal structures are unremarkable.  IMPRESSION: 1. Cardiac enlargement. Mild pleural fluid and pulmonary vascular congestion.   Electronically Signed   By: Kerby Moors M.D.   On: 01/17/2014 23:00     EKG Interpretation   Date/Time:  Tuesday January 18 2014 21:12:30 EDT Ventricular Rate:  76 PR Interval:  172 QRS Duration: 88 QT Interval:  422 QTC Calculation: 474 R Axis:   -50 Text Interpretation:  Normal sinus rhythm Possible Left atrial enlargement  Left axis deviation Nonspecific ST and T wave abnormality Prolonged QT  Abnormal ECG Similar to prior Confirmed by Heydi Swango  MD, [place] ([id number]) on  01/19/2014 12:31:14 AM      MDM   Final diagnoses:  Shortness of breath  Hypervolemia, unspecified hypervolemia type    Patient presents with shortness of breath. While nontoxic-appearing exam his vital signs are notable for hypertension with a blood pressure of 190/139. He is not requiring supplemental oxygen. He does have fine crackles at the bases and 1+ edema.  Lab work  notable for mild hyperkalemia at 5.5. Patient's creatinine is increased from this morning suggesting that he did not receive much of a dialysis session. Chest x-ray shows chronic pulmonary edema. Patient was given his home blood pressure medications as well as 5 mg IV labetalol. Given persistence of symptoms and need for dialysis, will admit.    Merryl Hacker, MD 01/19/14 [phone]

## 2014-01-19 NOTE — Discharge Summary (Signed)
AMA NOTE   Patient ID: Frank [name] MRN: [id number] DOB/AGE: [birth year] 50 y.o.  Admit date: 01/19/2014 Discharge date: 01/19/2014  Primary Care Physician:  Adin Hector  Discharge Diagnoses:    . acute respiratory failure secondary to pulmonary edema . end-stage renal disease on hemodialysis, noncompliant, missed hemodialysis [facility] dialysis 6/27 and signed off early   . Hypertensive emergency . Anemia of chronic kidney failure Severely noncompliant with medical treatment   Consults:Nephrology, Dr.COLODONATO   Recommendations for Outpatient Follow-up:  Patient left AMA   Allergies:   Allergies  Allergen Reactions  . Ferrlecit [Na Ferric Gluc Cplx In Sucrose] Shortness Of Breath, Swelling and Other (See Comments)    Swelling in throat  . Darvocet [Propoxyphene N-Acetaminophen] Hives     Discharge Medications:   Patient left AMA      Brief H and P: For complete details please refer to admission H and P, but in brief, patient is a 48 year old male with history of ESRD on HD, TTS presented to ER with shortness of breath. He told the ER physician that he had dialysis the day before but did not complete it because of his clotted graft. Later in the evening he became short of breath acutely and presented to the ER. Chest x-ray showed congestion and patient's blood pressure was significantly elevated. Nephrology was consulted and patient was admitted for further management.   Hospital Course:  Acute respiratory failure secondary to pulmonary edema, dialysis patient with severe noncompliance, hypertensive emergency Patient was admitted to step down unit, he did not need BiPAP. Nephrology was consulted, and patient was seen by Dr. Arty Baumgartner, who contacted his outpatient hemodialysis unit. Per the outpatient HD unit he has only been to 4 treatments over the last 3 weeks, and his last dialysis was 6/27 and he had signed off early at bedtime as well. Apparently patient had lied  to the ER physicians that he had trouble with his graft, per nephrology patient did not go for his hemodialysis and does not have a functional graft. Patient went for hemodialysis here and appeared to be tolerating well but then decided to stop his treatment after 2 hours and 13 minutes and refused to the monitoring equipment and demanded to be discharged.  I explained to the patient the risk of not completely his hemodialysis, recurrent pulmonary edema and shortness of breath, hypertensive emergency can cause MI, stroke and death. The patient stated that "he always signs off early, he can go to his outpatient hemodialysis unit tomorrow and we are wasting his time".     ESRD on dialysis- Tues, Thurs, Sat in [place], nephro: [name] - See above    Hypertensive emergency - Patient required IV antihypertensives and oral hypertensives for his BP in stepdown unit. At the time of admission blood pressure was 217/133. Again patient did not complete his hemodialysis, his pressure is still 181/108 and patient decided to leave AMA. He refused to sign the AMA form.    Day of Discharge BP 181/108  Pulse 78  Temp(Src) 97.6 F (36.4 C) (Oral)  Resp 13  Ht 6' 1.5" (1.867 m)  Wt 80.8 kg (178 lb 2.1 oz)  BMI 23.18 kg/m2  SpO2 95%  Physical Exam: General: Alert and awake oriented x3 CVS: S1-S2 clear no murmur rubs or gallops Chest: clear to auscultation bilaterally, no wheezing rales or rhonchi Abdomen: soft nontender, nondistended, normal bowel sounds Extremities: no cyanosis, clubbing or edema noted bilaterally    The results of significant  diagnostics from this hospitalization (including imaging, microbiology, ancillary and laboratory) are listed below for reference.    LAB RESULTS: Basic Metabolic Panel:  Recent Labs Lab 01/18/14 2125 01/19/14 0703  NA 136* 137  K 5.5* 5.5*  CL 100 100  CO2 18* 18*  GLUCOSE 109* 100*  BUN 69* 72*  CREATININE 15.78* 15.96*  CALCIUM 8.4 8.2*    Liver Function Tests:  Recent Labs Lab 01/18/14 0048 01/19/14 0703  AST 39* 46*  ALT 29 42  ALKPHOS 77 80  BILITOT 0.3 0.3  PROT 7.2 7.1  ALBUMIN 2.8* 2.8*   No results found for this basename: LIPASE, AMYLASE,  in the last 168 hours No results found for this basename: AMMONIA,  in the last 168 hours CBC:  Recent Labs Lab 01/18/14 2125 01/19/14 0703  WBC 6.3 5.6  NEUTROABS  --  3.1  HGB 9.6* 10.1*  HCT 30.6* 32.4*  MCV 83.6 86.2  PLT 205 220   Cardiac Enzymes:  Recent Labs Lab 01/19/14 0051  TROPONINI <0.30   BNP: No components found with this basename: POCBNP,  CBG:  Recent Labs Lab 01/18/14 0420 01/19/14 0811  GLUCAP 141* 124*    Significant Diagnostic Studies:  Dg Chest 2 View  01/19/2014   CLINICAL DATA:  Shortness of breath.  Insomnia.  Anxiety.  EXAM: CHEST  2 VIEW  COMPARISON:  01/17/2014.  FINDINGS: Right IJ dialysis catheter is in stable position, tip at the upper right atrial level. There is chronic cardiomegaly. Unremarkable upper mediastinal contours.  Chronic pulmonary venous congestion. No edema, consolidation, effusion, or pneumothorax.  IMPRESSION: Cardiomegaly and chronic venous congestion.  No edema or pneumonia.   Electronically Signed   By: Jorje Guild M.D.   On: 01/19/2014 02:16       Disposition and Follow-up:   DISCHARGE FOLLOW-UP     Follow-up Information   Follow up with Adin Hector. Schedule an appointment as soon as possible for a visit in 2 weeks. (for hospital follow-up)    Specialty:  General Practice      Time spent on Discharge: 40 minutes   Signed:   RAI,RIPUDEEP M.D. [facility] 01/19/2014, 5:42 PM Pager: [phone]   **Disclaimer: This note was dictated with voice recognition software. Similar sounding words can inadvertently be transcribed and this note may contain transcription errors which may not have been corrected upon publication of note.**

## 2014-01-19 NOTE — H&P (Signed)
[facility] History and Physical  Frank [name] XFG:[id number] DOB: [birth year] DOA: 01/19/2014  Referring physician: ER physician. PCP: Adin Hector  Chief Complaint: Shortness of breath.  HPI: Frank [name] is a 48 y.o. male with history of ESRD on hemodialysis Tuesday Thursdays and Saturday presents to the ER because of shortness of breath. Patient had dialysis yesterday morning. The ED physician stated that patient did not have complete section of dialysis yesterday because his graft was clotted. Later in the evening patient became short of breath acutely presented to the ER. Chest x-ray shows congestion and patient's blood pressure was significantly elevated. On-call nephrologist was consulted and patient has been admitted for further management. Patient denies any chest pain productive cough fever chills headache focal deficits or abdominal pain. For his blood pressure patient was given labetalol 5 mg IV and home dose of his Norvasc. At this time patient is not in acute distress.   Review of Systems: As presented in the history of presenting illness, rest negative.  Past Medical History  Diagnosis Date  . Hypertension   . ESRD (end stage renal disease)     due to HTN per patient, followed at [facility], s/p failed kidney transplant  . Depression   . Complication of anesthesia     itching, sore throat  . Diabetes mellitus without complication     No history per patient, but remains under history as A1c would not be accurate given on dialysis  . Shortness of breath   . Anxiety    Past Surgical History  Procedure Laterality Date  . Kidney receipient  2006    failed and started HD in March 2014  . Capd insertion    . Capd removal     Social History:  reports that he has quit smoking. His smoking use included Cigarettes. He has a 1 pack-year smoking history. He has never used smokeless tobacco. He reports that he does not drink alcohol or use illicit drugs. Where does  patient live home. Can patient participate in ADLs? Yes.  Allergies  Allergen Reactions  . Ferrlecit [Na Ferric Gluc Cplx In Sucrose] Shortness Of Breath, Swelling and Other (See Comments)    Swelling in throat  . Darvocet [Propoxyphene N-Acetaminophen] Hives    Family History: History reviewed. No pertinent family history.    Prior to Admission medications   Medication Sig Start Date End Date Taking? Authorizing Provider  allopurinol (ZYLOPRIM) 100 MG tablet Take 100 mg by mouth daily.   Yes Historical Provider, MD  amLODipine (NORVASC) 5 MG tablet Take 10 mg by mouth daily.   Yes Historical Provider, MD  cinacalcet (SENSIPAR) 30 MG tablet Take 30 mg by mouth daily.   Yes Historical Provider, MD  ciprofloxacin (CIPRO) 500 MG tablet Take 1 tablet (500 mg total) by mouth 2 (two) times daily. 12/21/13  Yes Kalman Drape, MD  cloNIDine (CATAPRES) 0.3 MG tablet Take 0.3 mg by mouth 3 (three) times daily.   Yes Historical Provider, MD  colchicine 0.6 MG tablet Take 0.6 mg by mouth daily as needed (for gout). 04/13/13  Yes [name], MD  furosemide (LASIX) 80 MG tablet Take 80 mg by mouth daily.   Yes Historical Provider, MD  labetalol (NORMODYNE) 200 MG tablet Take 300 mg by mouth 2 (two) times daily.    Yes Historical Provider, MD  lisinopril (PRINIVIL,ZESTRIL) 20 MG tablet Take 20 mg by mouth daily.   Yes Historical Provider, MD  metroNIDAZOLE (FLAGYL) 500 MG  tablet Take 500 mg by mouth 2 (two) times daily.   Yes Historical Provider, MD  omeprazole (PRILOSEC) 20 MG capsule Take 20 mg by mouth daily. 07/01/13  Yes Historical Provider, MD  predniSONE (DELTASONE) 5 MG tablet Take 15 mg by mouth daily with breakfast.   Yes Historical Provider, MD  promethazine (PHENERGAN) 25 MG tablet Take 1 tablet (25 mg total) by mouth every 6 (six) hours as needed for nausea. 12/21/13  Yes Kalman Drape, MD  sevelamer carbonate (RENVELA) 800 MG tablet Take 1,600 mg by mouth 3 (three) times daily with meals.  12/27/13 12/27/14 Yes Historical Provider, MD  tacrolimus (PROGRAF) 5 MG capsule Take 10 mg by mouth 2 (two) times daily.    Yes Historical Provider, MD  minoxidil (LONITEN) 2.5 MG tablet Take 2.5 mg by mouth daily.  10/12/13   Historical Provider, MD    Physical Exam: Filed Vitals:   01/18/14 2118 01/18/14 2350 01/19/14 0330 01/19/14 0500  BP:  190/139 195/146 201/143  Pulse: 75 83 76 75  Temp: 98.4 F (36.9 C) 97.5 F (36.4 C)    TempSrc: Oral Oral    Resp:  _0 Height: 6' 1.5" (1.867 m)     Weight: 82.373 kg (181 lb 9.6 oz)     SpO2: 98% 98% 100% 95%     General:  Moderately built and nourished.  Eyes: Anicteric no pallor.  ENT: No discharge from the ears eyes nose mouth.  Neck: No mass felt.  Cardiovascular: S1-S2 heard.  Respiratory: No rhonchi or crepitations.  Abdomen: Soft nontender bowel sounds present. No guarding or rigidity.  Skin: No rash.  Musculoskeletal: No edema.  Psychiatric: Patient is sleepy after getting a dose of Ativan in the ER but answers questions appropriately.   Neurologic: Moves all extremities 5 x 5.  Labs on Admission:  Basic Metabolic Panel:  Recent Labs Lab 01/17/14 2239 01/18/14 0048 01/18/14 2125  NA 138 139 136*  K 5.3 5.4* 5.5*  CL 110 101 100  CO2  --  18* 18*  GLUCOSE 78 61* 109*  BUN 50* 57* 69*  CREATININE 15.70* 14.11* 15.78*  CALCIUM  --  8.6 8.4   Liver Function Tests:  Recent Labs Lab 01/18/14 0048  AST 39*  ALT 29  ALKPHOS 77  BILITOT 0.3  PROT 7.2  ALBUMIN 2.8*   No results found for this basename: LIPASE, AMYLASE,  in the last 168 hours No results found for this basename: AMMONIA,  in the last 168 hours CBC:  Recent Labs Lab 01/17/14 2234 01/17/14 2239 01/18/14 2125  WBC 5.5  --  6.3  NEUTROABS 2.7  --   --   HGB 10.5* 12.9* 9.6*  HCT 33.2* 38.0* 30.6*  MCV 84.7  --  83.6  PLT 213  --  205   Cardiac Enzymes:  Recent Labs Lab 01/19/14 0051  TROPONINI <0.30    BNP (last 3  results)  Recent Labs  12/12/13 1641 01/02/14 0317 01/18/14 0048  PROBNP >70000.0* >70000.0* >70000.0*   CBG:  Recent Labs Lab 01/18/14 0420  GLUCAP 141*    Radiological Exams on Admission: Dg Chest 2 View  01/19/2014   CLINICAL DATA:  Shortness of breath.  Insomnia.  Anxiety.  EXAM: CHEST  2 VIEW  COMPARISON:  01/17/2014.  FINDINGS: Right IJ dialysis catheter is in stable position, tip at the upper right atrial level. There is chronic cardiomegaly. Unremarkable upper mediastinal contours.  Chronic pulmonary venous congestion. No edema,  consolidation, effusion, or pneumothorax.  IMPRESSION: Cardiomegaly and chronic venous congestion.  No edema or pneumonia.   Electronically Signed   By: Jorje Guild M.D.   On: 01/19/2014 02:16   Dg Chest 2 View  01/17/2014   CLINICAL DATA:  Shortness of breath.  EXAM: CHEST  2 VIEW  COMPARISON:  01/11/2014  FINDINGS: Right chest wall dialysis catheter is noted with tips in the right atrium. Moderate cardiac enlargement. There is a small amount of fluid identified within the minor and major fissure of the right lung. Pulmonary vascular congestion is noted. The visualized skeletal structures are unremarkable.  IMPRESSION: 1. Cardiac enlargement. Mild pleural fluid and pulmonary vascular congestion.   Electronically Signed   By: Kerby Moors M.D.   On: 01/17/2014 23:00    EKG: Independently reviewed. Normal sinus rhythm with LVH changes.  Assessment/Plan Principal Problem:   Shortness of breath Active Problems:   ESRD on dialysis- Tues, Thurs, Sat in [employer], nephro: [name]   Anemia of chronic kidney failure   Hypertensive emergency   Acute respiratory failure   1. Acute respiratory failure secondary to pulmonary edema in a dialysis patient - nephrologist has been notified about patient's admission. Continue to monitor in step down. Patient presently is not in acute distress. Shortness of breath could also be further worsening because of  uncontrolled blood pressure. 2. Hypertensive emergency - doubtful compliance of medication. I think by restarting his home medication and patient getting dialysis his blood pressure improve. Patient at this time denies any chest pain or headaches or any focal deficits. Patient is on when necessary IV labetalol. 3. Anemia probably from kidney disease - follow CBC.  Patient's medication reconciliation form shows prednisone and antibiotics which patient states he does not take.   Code Status: Full code.  Family Communication: None.  Disposition Plan: Admit to inpatient.    Que Meneely N. [facility] Pager [phone].  If 7PM-7AM, please contact night-coverage [email or web address] Password [email or web address] 01/19/2014, 5:35 AM

## 2014-01-19 NOTE — Progress Notes (Signed)
Dr Tana Coast stated patient may go to HD unit for dialysis treatment

## 2014-01-19 NOTE — Procedures (Signed)
Patient was seen on dialysis and the procedure was supervised. BFR 400 Via RIJ TDC BP is 196/133.  Patient appears to be tolerating treatment well

## 2014-01-20 LAB — HEPATITIS B SURFACE ANTIBODY,QUALITATIVE: HEP B S AB: POSITIVE — AB

## 2014-01-20 LAB — HEPATITIS B SURFACE ANTIGEN: HEP B S AG: NEGATIVE

## 2014-01-20 NOTE — Progress Notes (Signed)
UR Completed.  Vergie Living [phone] 01/20/2014

## 2014-01-25 ENCOUNTER — Encounter (HOSPITAL_COMMUNITY): Payer: Self-pay | Admitting: Emergency Medicine

## 2014-01-25 ENCOUNTER — Emergency Department (HOSPITAL_COMMUNITY): Payer: Medicare Other

## 2014-01-25 ENCOUNTER — Emergency Department (HOSPITAL_COMMUNITY)
Admission: EM | Admit: 2014-01-25 | Discharge: 2014-01-25 | Disposition: A | Discharge status: Home / Self Care | Payer: Medicare Other | Attending: Emergency Medicine | Admitting: Emergency Medicine

## 2014-01-25 ENCOUNTER — Emergency Department (HOSPITAL_COMMUNITY)
Admission: EM | Admit: 2014-01-25 | Discharge: 2014-01-26 | Disposition: A | Discharge status: Home / Self Care | Payer: Medicare Other | Source: Home / Self Care | Attending: Emergency Medicine | Admitting: Emergency Medicine

## 2014-01-25 DIAGNOSIS — Z87891 Personal history of nicotine dependence: Secondary | ICD-10-CM | POA: Insufficient documentation

## 2014-01-25 DIAGNOSIS — E119 Type 2 diabetes mellitus without complications: Secondary | ICD-10-CM | POA: Insufficient documentation

## 2014-01-25 DIAGNOSIS — F411 Generalized anxiety disorder: Secondary | ICD-10-CM

## 2014-01-25 DIAGNOSIS — F329 Major depressive disorder, single episode, unspecified: Secondary | ICD-10-CM | POA: Insufficient documentation

## 2014-01-25 DIAGNOSIS — F419 Anxiety disorder, unspecified: Secondary | ICD-10-CM

## 2014-01-25 DIAGNOSIS — N186 End stage renal disease: Secondary | ICD-10-CM | POA: Insufficient documentation

## 2014-01-25 DIAGNOSIS — F3289 Other specified depressive episodes: Secondary | ICD-10-CM | POA: Insufficient documentation

## 2014-01-25 DIAGNOSIS — Z79899 Other long term (current) drug therapy: Secondary | ICD-10-CM | POA: Insufficient documentation

## 2014-01-25 DIAGNOSIS — R0602 Shortness of breath: Secondary | ICD-10-CM | POA: Insufficient documentation

## 2014-01-25 DIAGNOSIS — E1129 Type 2 diabetes mellitus with other diabetic kidney complication: Secondary | ICD-10-CM | POA: Insufficient documentation

## 2014-01-25 DIAGNOSIS — M546 Pain in thoracic spine: Secondary | ICD-10-CM | POA: Insufficient documentation

## 2014-01-25 DIAGNOSIS — I120 Hypertensive chronic kidney disease with stage 5 chronic kidney disease or end stage renal disease: Secondary | ICD-10-CM | POA: Insufficient documentation

## 2014-01-25 DIAGNOSIS — I10 Essential (primary) hypertension: Secondary | ICD-10-CM

## 2014-01-25 DIAGNOSIS — IMO0002 Reserved for concepts with insufficient information to code with codable children: Secondary | ICD-10-CM | POA: Insufficient documentation

## 2014-01-25 DIAGNOSIS — Z885 Allergy status to narcotic agent status: Secondary | ICD-10-CM | POA: Insufficient documentation

## 2014-01-25 DIAGNOSIS — Z888 Allergy status to other drugs, medicaments and biological substances status: Secondary | ICD-10-CM | POA: Insufficient documentation

## 2014-01-25 DIAGNOSIS — Z992 Dependence on renal dialysis: Secondary | ICD-10-CM | POA: Insufficient documentation

## 2014-01-25 LAB — CBC WITH DIFFERENTIAL/PLATELET
BASOS ABS: 0 10*3/uL (ref 0.0–0.1)
BASOS PCT: 0 % (ref 0–1)
EOS ABS: 0.3 10*3/uL (ref 0.0–0.7)
Eosinophils Relative: 5 % (ref 0–5)
HCT: 28.9 % — ABNORMAL LOW (ref 39.0–52.0)
Hemoglobin: 9.3 g/dL — ABNORMAL LOW (ref 13.0–17.0)
Lymphocytes Relative: 22 % (ref 12–46)
Lymphs Abs: 1.2 10*3/uL (ref 0.7–4.0)
MCH: 27.1 pg (ref 26.0–34.0)
MCHC: 32.2 g/dL (ref 30.0–36.0)
MCV: 84.3 fL (ref 78.0–100.0)
MONOS PCT: 13 % — AB (ref 3–12)
Monocytes Absolute: 0.7 10*3/uL (ref 0.1–1.0)
NEUTROS ABS: 3.3 10*3/uL (ref 1.7–7.7)
NEUTROS PCT: 60 % (ref 43–77)
Platelets: 126 10*3/uL — ABNORMAL LOW (ref 150–400)
RBC: 3.43 MIL/uL — ABNORMAL LOW (ref 4.22–5.81)
RDW: 17.1 % — AB (ref 11.5–15.5)
WBC: 5.5 10*3/uL (ref 4.0–10.5)

## 2014-01-25 LAB — BASIC METABOLIC PANEL
ANION GAP: 11 (ref 5–15)
BUN: 40 mg/dL — AB (ref 6–23)
CHLORIDE: 103 meq/L (ref 96–112)
CO2: 25 mEq/L (ref 19–32)
Calcium: 8.4 mg/dL (ref 8.4–10.5)
Creatinine, Ser: 10.31 mg/dL — ABNORMAL HIGH (ref 0.50–1.35)
GFR, EST AFRICAN AMERICAN: 6 mL/min — AB (ref >90)
GFR, EST NON AFRICAN AMERICAN: 5 mL/min — AB (ref >90)
Glucose, Bld: 111 mg/dL — ABNORMAL HIGH (ref 70–99)
Potassium: 5 mEq/L (ref 3.7–5.3)
Sodium: 139 mEq/L (ref 137–147)

## 2014-01-25 MED ORDER — DIAZEPAM 5 MG PO TABS: 5.0000 mg | ORAL_TABLET | Freq: Two times a day (BID) | ORAL | Status: DC

## 2014-01-25 MED ORDER — DIAZEPAM 5 MG PO TABS
5.0000 mg | ORAL_TABLET | Freq: Once | ORAL | Status: AC
  Administered 2014-01-25: 5 mg via ORAL
  Filled 2014-01-25: qty 1

## 2014-01-25 MED ORDER — CINACALCET HCL 30 MG PO TABS
30.0000 mg | ORAL_TABLET | Freq: Once | ORAL | Status: AC
  Administered 2014-01-25: 30 mg via ORAL
  Filled 2014-01-25: qty 1

## 2014-01-25 MED ORDER — CLONIDINE HCL 0.1 MG PO TABS
0.3000 mg | ORAL_TABLET | Freq: Three times a day (TID) | ORAL | Status: DC
  Administered 2014-01-25: 0.3 mg via ORAL
  Filled 2014-01-25 (×2): qty 3

## 2014-01-25 MED ORDER — LABETALOL HCL 300 MG PO TABS
300.0000 mg | ORAL_TABLET | Freq: Two times a day (BID) | ORAL | Status: DC
  Administered 2014-01-25: 300 mg via ORAL
  Filled 2014-01-25 (×2): qty 1

## 2014-01-25 NOTE — Discharge Instructions (Signed)
Muscle Strain A muscle strain is an injury that occurs when a muscle is stretched beyond its normal length. Usually a small number of muscle fibers are torn when this happens. Muscle strain is rated in degrees. First-degree strains have the least amount of muscle fiber tearing and pain. Second-degree and third-degree strains have increasingly more tearing and pain.  Usually, recovery from muscle strain takes 1-2 weeks. Complete healing takes 5-6 weeks.  CAUSES  Muscle strain happens when a sudden, violent force placed on a muscle stretches it too far. This may occur with lifting, sports, or a fall.  RISK FACTORS Muscle strain is especially common in athletes.  SIGNS AND SYMPTOMS At the site of the muscle strain, there may be:  Pain.  Bruising.  Swelling.  Difficulty using the muscle due to pain or lack of normal function. DIAGNOSIS  Your health care provider will perform a physical exam and ask about your medical history. TREATMENT  Often, the best treatment for a muscle strain is resting, icing, and applying cold compresses to the injured area.  HOME CARE INSTRUCTIONS   Use the PRICE method of treatment to promote muscle healing during the first 2-3 days after your injury. The PRICE method involves:  Protecting the muscle from being injured again.  Restricting your activity and resting the injured body part.  Icing your injury. To do this, put ice in a plastic bag. Place a towel between your skin and the bag. Then, apply the ice and leave it on from 15-20 minutes each hour. After the third day, switch to moist heat packs.  Apply compression to the injured area with a splint or elastic bandage. Be careful not to wrap it too tightly. This may interfere with blood circulation or increase swelling.  Elevate the injured body part above the level of your heart as often as you can.  Only take over-the-counter or prescription medicines for pain, discomfort, or fever as directed by your  health care provider.  Warming up prior to exercise helps to prevent future muscle strains. SEEK MEDICAL CARE IF:   You have increasing pain or swelling in the injured area.  You have numbness, tingling, or a significant loss of strength in the injured area. MAKE SURE YOU:   Understand these instructions.  Will watch your condition.  Will get help right away if you are not doing well or get worse. Document Released: 07/08/2005 Document Revised: 04/28/2013 Document Reviewed: 02/04/2013 [facility] Patient Information 2015 [place], [place]. This information is not intended to replace advice given to you by your health care provider. Make sure you discuss any questions you have with your health care provider.

## 2014-01-25 NOTE — ED Provider Notes (Signed)
CSN: [id number]     Arrival date & time 01/25/14  1850 History   First MD Initiated Contact with Patient 01/25/14 1910     Chief Complaint  Patient presents with  . Anxiety    HPI Patient presents emergency room with complaints of recurrent tissues with shortness of breath and anxiety. Patient does have a history of chronic kidney disease. He was seen in the emergency room yesterday because he started to have complaints of shortness of breath. Patient noticed that he would get anxious and felt anxious he would feel very short of breath. He was not sleeping well. The patient was falling asleep at night and then waking up abruptly feeling like he had stopped breathing. In the past he was told he needed to have a sleep study. Patient went to the emergency department yesterday. He had laboratory tests and x-rays unremarkable. Patient was scheduled for dialysis this morning. Patient went and had a normal session without any difficulties. Patient was prescribed Valium however he did not get that filled. This afternoon he started having trouble with feeling short of breath again. He has not had fevers. He's not had any trouble with chest pain. He did not take his afternoon medications. Past Medical History  Diagnosis Date  . Hypertension   . ESRD (end stage renal disease)     due to HTN per patient, followed at [facility], s/p failed kidney transplant  . Depression   . Complication of anesthesia     itching, sore throat  . Diabetes mellitus without complication     No history per patient, but remains under history as A1c would not be accurate given on dialysis  . Shortness of breath   . Anxiety    Past Surgical History  Procedure Laterality Date  . Kidney receipient  2006    failed and started HD in March 2014  . Capd insertion    . Capd removal     No family history on file. History  Substance Use Topics  . Smoking status: Former Smoker -- 1.00 packs/day for 1 years    Types: Cigarettes  .  Smokeless tobacco: Never Used     Comment: quit Jan 2014  . Alcohol Use: No    Review of Systems  All other systems reviewed and are negative.     Allergies  Ferrlecit and Darvocet  Home Medications   Prior to Admission medications   Medication Sig Start Date End Date Taking? Authorizing Provider  allopurinol (ZYLOPRIM) 100 MG tablet Take 100 mg by mouth daily.   Yes Historical Provider, MD  amLODipine (NORVASC) 5 MG tablet Take 10 mg by mouth daily.   Yes Historical Provider, MD  cinacalcet (SENSIPAR) 30 MG tablet Take 30 mg by mouth daily.   Yes Historical Provider, MD  cloNIDine (CATAPRES) 0.3 MG tablet Take 0.3 mg by mouth 3 (three) times daily.   Yes Historical Provider, MD  diazepam (VALIUM) 5 MG tablet Take 1 tablet (5 mg total) by mouth 2 (two) times daily. 01/25/14  Yes [name], PA-C  furosemide (LASIX) 80 MG tablet Take 80 mg by mouth daily.   Yes Historical Provider, MD  labetalol (NORMODYNE) 200 MG tablet Take 300 mg by mouth 2 (two) times daily.    Yes Historical Provider, MD  lisinopril (PRINIVIL,ZESTRIL) 20 MG tablet Take 20 mg by mouth daily.   Yes Historical Provider, MD  minoxidil (LONITEN) 2.5 MG tablet Take 2.5 mg by mouth daily.  10/12/13  Yes Historical Provider,  MD  omeprazole (PRILOSEC) 20 MG capsule Take 20 mg by mouth daily. 07/01/13  Yes Historical Provider, MD  predniSONE (DELTASONE) 5 MG tablet Take 15 mg by mouth daily with breakfast.   Yes Historical Provider, MD  promethazine (PHENERGAN) 25 MG tablet Take 1 tablet (25 mg total) by mouth every 6 (six) hours as needed for nausea. 12/21/13  Yes Kalman Drape, MD  sevelamer carbonate (RENVELA) 800 MG tablet Take 1,600 mg by mouth 3 (three) times daily with meals. 12/27/13 12/27/14 Yes Historical Provider, MD  tacrolimus (PROGRAF) 5 MG capsule Take 10 mg by mouth 2 (two) times daily.    Yes Historical Provider, MD   BP 182/133  Pulse 82  Temp(Src) 98.1 F (36.7 C) (Oral)  Resp 20  SpO2 97% Physical Exam   Nursing note and vitals reviewed. Constitutional: He appears well-developed and well-nourished. No distress.  HENT:  Head: Normocephalic and atraumatic.  Right Ear: External ear normal.  Left Ear: External ear normal.  Eyes: Conjunctivae are normal. Right eye exhibits no discharge. Left eye exhibits no discharge. No scleral icterus.  Neck: Neck supple. No tracheal deviation present.  Cardiovascular: Normal rate, regular rhythm and intact distal pulses.   Pulmonary/Chest: Effort normal and breath sounds normal. No stridor. No respiratory distress. He has no wheezes. He has no rales.  Abdominal: Soft. Bowel sounds are normal. He exhibits no distension. There is no tenderness. There is no rebound and no guarding.  Musculoskeletal: He exhibits no edema and no tenderness.  Neurological: He is alert. He has normal strength. No cranial nerve deficit (no facial droop, extraocular movements intact, no slurred speech) or sensory deficit. He exhibits normal muscle tone. He displays no seizure activity. Coordination normal.  Skin: Skin is warm and dry. No rash noted.  Psychiatric: His mood appears anxious.    ED Course  Procedures (including critical care time) Labs Review Labs Reviewed  CBC WITH DIFFERENTIAL - Abnormal; Notable for the following:    RBC 3.43 (*)    Hemoglobin 9.3 (*)    HCT 28.9 (*)    RDW 17.1 (*)    Platelets 126 (*)    Monocytes Relative 13 (*)    All other components within normal limits  BASIC METABOLIC PANEL - Abnormal; Notable for the following:    Glucose, Bld 111 (*)    BUN 40 (*)    Creatinine, Ser 10.31 (*)    GFR calc non Af Amer 5 (*)    GFR calc Af Amer 6 (*)    All other components within normal limits    Imaging Review Dg Chest 2 View  01/25/2014   CLINICAL DATA:  Shortness of breath.  Diabetes and hypertension.  EXAM: CHEST  2 VIEW  COMPARISON:  01/25/2014  FINDINGS: Moderate cardiac enlargement is stable. Pulmonary vascular congestion is unchanged. No  evidence of acute pulmonary infiltrate or edema. Right internal jugular central venous dialysis catheter remains in stable position.  IMPRESSION: Stable cardiac enlargement and pulmonary venous hypertension. No active lung disease.   Electronically Signed   By: Earle Gell M.D.   On: 01/25/2014 19:57   Dg Chest 2 View  01/25/2014   CLINICAL DATA:  Short of breath in upper back pain for 48 hr.  EXAM: CHEST  2 VIEW  COMPARISON:  01/19/2014  FINDINGS: Cardiac enlargement with mild central vascular congestion. No evidence of edema. No focal consolidation. Unchanged appearance of right central venous catheter. No change since prior study.  IMPRESSION: Cardiac enlargement  with mild vascular congestion.  No edema.   Electronically Signed   By: Lucienne Capers M.D.   On: 01/25/2014 02:36     EKG Interpretation   Date/Time:  Tuesday January 25 2014 19:40:18 EDT Ventricular Rate:  84 PR Interval:  172 QRS Duration: 85 QT Interval:  417 QTC Calculation: 493 R Axis:   -24 Text Interpretation:  Sinus rhythm Probable left atrial enlargement  Probable left ventricular hypertrophy Nonspecific T abnormalities, lateral  leads Borderline prolonged QT interval Baseline wander in lead(s) V2 No  significant change since last tracing Confirmed by Kayleigh Broadwell  MD-J, Cavin Longman  ([id number]) on 01/25/2014 7:52:41 PM      MDM   Final diagnoses:  Anxiety  Essential hypertension    Pt symptoms are most likely related to anxiety.  He is hypertensive however he is chronically hypertensive.  Previous BPs have been as 203/146.  He is again elevated today but he has not taken his medications this afternoon and this evening.  I did give him doses of his oral medications.  No edema noted on CXR.  Pt did have dialysis today.  Doubt hypertensive emergency.  At this time there does not appear to be any evidence of an acute emergency medical condition and the patient appears stable for discharge with appropriate outpatient follow  up.     Dorie Rank, MD 01/25/14 2112

## 2014-01-25 NOTE — ED Notes (Signed)
Pt out of room for x-ray

## 2014-01-25 NOTE — ED Notes (Signed)
Pt presents to the ED d/t not being able to sleep.  Pt c/o sleep apnea type sx.  Pt states he falls asleep and then wakes up abruptly feeling like he has stopped breathing.  States this started 2-3 months ago however intensified w/i the last 3 nights.  Pt states he knows he needs a sleep study however has not set up an appointment at this time.

## 2014-01-25 NOTE — ED Provider Notes (Signed)
CSN: [id number]     Arrival date & time 01/25/14  0059 History   First MD Initiated Contact with Patient 01/25/14 [phone]     Chief Complaint  Patient presents with  . Back Pain    thoracic spine     (Consider location/radiation/quality/duration/timing/severity/associated sxs/prior Treatment) HPI Comments: Patient is a 48 y/o male with a hx of ESRD (T/Th/Sa dialysis, last dialyzed Saturday), DM, HTN, and anxiety who presents to the ED for b/l thoracic back pain. He states that pain is sharp and worse with movement. He states he feels as though his back pain is a result of his increased stress/anxiety and lack of sleep; patient states he hasn't had a full night's sleep in 4 days secondary to his anxiety. He states he has talked to his PCP regarding his insomnia and she told him "to just rest". Patient has not tried anything for his symptoms PTA. He denies associated fever, cough, CP, SOB, hemoptysis, N/V, abdominal pain, numbness/tingling, extremity weakness, and syncope. No trauma or injury or bowel/bladder incontinence.  Patient is well know to the ED with 20 visits in the last 6 months.  The history is provided by the patient. No language interpreter was used.    Past Medical History  Diagnosis Date  . Hypertension   . ESRD (end stage renal disease)     due to HTN per patient, followed at [facility], s/p failed kidney transplant  . Depression   . Complication of anesthesia     itching, sore throat  . Diabetes mellitus without complication     No history per patient, but remains under history as A1c would not be accurate given on dialysis  . Shortness of breath   . Anxiety    Past Surgical History  Procedure Laterality Date  . Kidney receipient  2006    failed and started HD in March 2014  . Capd insertion    . Capd removal     No family history on file. History  Substance Use Topics  . Smoking status: Former Smoker -- 1.00 packs/day for 1 years    Types: Cigarettes  . Smokeless  tobacco: Never Used     Comment: quit Jan 2014  . Alcohol Use: No    Review of Systems  Musculoskeletal: Positive for back pain.  All other systems reviewed and are negative.     Allergies  Ferrlecit and Darvocet  Home Medications   Prior to Admission medications   Medication Sig Start Date End Date Taking? Authorizing Provider  allopurinol (ZYLOPRIM) 100 MG tablet Take 100 mg by mouth daily.    Historical Provider, MD  amLODipine (NORVASC) 5 MG tablet Take 10 mg by mouth daily.    Historical Provider, MD  cinacalcet (SENSIPAR) 30 MG tablet Take 30 mg by mouth daily.    Historical Provider, MD  ciprofloxacin (CIPRO) 500 MG tablet Take 1 tablet (500 mg total) by mouth 2 (two) times daily. 12/21/13   Kalman Drape, MD  cloNIDine (CATAPRES) 0.3 MG tablet Take 0.3 mg by mouth 3 (three) times daily.    Historical Provider, MD  colchicine 0.6 MG tablet Take 0.6 mg by mouth daily as needed (for gout). 04/13/13   [name], MD  furosemide (LASIX) 80 MG tablet Take 80 mg by mouth daily.    Historical Provider, MD  labetalol (NORMODYNE) 200 MG tablet Take 300 mg by mouth 2 (two) times daily.     Historical Provider, MD  lisinopril (PRINIVIL,ZESTRIL) 20 MG tablet  Take 20 mg by mouth daily.    Historical Provider, MD  metroNIDAZOLE (FLAGYL) 500 MG tablet Take 500 mg by mouth 2 (two) times daily.    Historical Provider, MD  minoxidil (LONITEN) 2.5 MG tablet Take 2.5 mg by mouth daily.  10/12/13   Historical Provider, MD  omeprazole (PRILOSEC) 20 MG capsule Take 20 mg by mouth daily. 07/01/13   Historical Provider, MD  predniSONE (DELTASONE) 5 MG tablet Take 15 mg by mouth daily with breakfast.    Historical Provider, MD  promethazine (PHENERGAN) 25 MG tablet Take 1 tablet (25 mg total) by mouth every 6 (six) hours as needed for nausea. 12/21/13   Kalman Drape, MD  sevelamer carbonate (RENVELA) 800 MG tablet Take 1,600 mg by mouth 3 (three) times daily with meals. 12/27/13 12/27/14  Historical Provider,  MD  tacrolimus (PROGRAF) 5 MG capsule Take 10 mg by mouth 2 (two) times daily.     Historical Provider, MD   BP 179/132  Pulse 82  Temp(Src) 97.9 F (36.6 C) (Oral)  Resp 16  SpO2 98%  Physical Exam  Nursing note and vitals reviewed. Constitutional: He is oriented to person, place, and time. He appears well-developed and well-nourished. No distress.  Nontoxic/nonseptic appearing.  HENT:  Head: Normocephalic and atraumatic.  Mouth/Throat: Oropharynx is clear and moist. No oropharyngeal exudate.  Eyes: Conjunctivae and EOM are normal. No scleral icterus.  Neck: Normal range of motion.  Cardiovascular: Normal rate, regular rhythm and normal heart sounds.   Pulmonary/Chest: Effort normal and breath sounds normal. No respiratory distress. He has no wheezes. He has no rales.  Chest expansion symmetric. No tachypnea or dyspnea.  Abdominal: Soft. He exhibits no distension. There is no tenderness. There is no rebound and no guarding.  Soft, nontender. No peritoneal signs.  Musculoskeletal: Normal range of motion. He exhibits tenderness.       Cervical back: Normal.       Thoracic back: He exhibits tenderness. He exhibits normal range of motion, no bony tenderness, no swelling, no deformity, no pain and no spasm.       Lumbar back: Normal.       Back:  TTP to lower thoracic paraspinal muscles without spasm. No TTP to the cervical, thoracic, or lumbosacral midline. No bony deformities, step offs, or crepitus.  Neurological: He is alert and oriented to person, place, and time.  Skin: Skin is warm and dry. No rash noted. He is not diaphoretic. No erythema. No pallor.  Psychiatric: He has a normal mood and affect. His behavior is normal.    ED Course  Procedures (including critical care time) Labs Review Labs Reviewed - No data to display  Imaging Review Dg Chest 2 View  01/25/2014   CLINICAL DATA:  Short of breath in upper back pain for 48 hr.  EXAM: CHEST  2 VIEW  COMPARISON:   01/19/2014  FINDINGS: Cardiac enlargement with mild central vascular congestion. No evidence of edema. No focal consolidation. Unchanged appearance of right central venous catheter. No change since prior study.  IMPRESSION: Cardiac enlargement with mild vascular congestion.  No edema.   Electronically Signed   By: Lucienne Capers M.D.   On: 01/25/2014 02:36     EKG Interpretation None      MDM   Final diagnoses:  Left-sided thoracic back pain  Essential hypertension, malignant    Patient is a 48 y/o male well known to the ED, who presents for thoracic back pain. No trauma/injury or signs  concerning for cauda equina. Physical exam with TTP to thoracic paraspinal muscles b/l. Patient believes his symptoms are secondary to anxiety as his anxiety has caused increased stress and lack of sleep over the last 4 days. No CP or SOB to suggest atypical ACS. Patient has no hx of ACS. No chest pain radiating to the back or movement of location of back pain to suggest dissection; no mediastinal widening on CXR. Doubt atypical PE given b/l nature of symptoms, palpable MSK pain, and lack of tachypnea, dypsnea, hypoxia, and tachycardia.  Patient endorses improvement over ED course with Valium. He is desiring d/c so that he may go be dialyzed. He is due for dialysis this AM. Have discussed patient's HTN with my attending who has seen and evaluated the patient and believes this is likely secondary to his need for dialysis in 1 hour. Again, no CP, SOB, extremity numbness/weakness, or syncope. Patient states he has been compliant with his HTN medications and is due for another dose this AM. Patient to be discharged with instruction to return if symptoms worsen. Patient agreeable to plan with no unaddressed concerns.     [name], PA-C 01/31/14 2059

## 2014-01-25 NOTE — ED Notes (Signed)
patient c/o thoracic back pain. Patient describes pain as sharp. Patient denies injury.

## 2014-01-25 NOTE — ED Notes (Addendum)
Pt states that he was last night for anxiety. Pt states that he went to dialysis and had fluid removed today but still having anxiety.  Pt states that he has had trouble sleeping the past 2-3 days. Pt states he got an hour of sleep in couple of days.  Pt states it's "because of my breathing that i cant sleep."  Pt got a prescription of valium but hasnt gotten it filled.

## 2014-01-25 NOTE — Discharge Instructions (Signed)
Panic Attacks Panic attacks are sudden, short feelings of great fear or discomfort. You may have them for no reason when you are relaxed, when you are uneasy (anxious), or when you are sleeping.  HOME CARE  Take all your medicines as told.  Check with your doctor before starting new medicines.  Keep all doctor visits. GET HELP IF:  You are not able to take your medicines as told.  Your symptoms do not get better.  Your symptoms get worse. GET HELP RIGHT AWAY IF:  Your attacks seem different than your normal attacks.  You have thoughts about hurting yourself or others.  You take panic attack medicine and you have a side effect. MAKE SURE YOU:  Understand these instructions.  Will watch your condition.  Will get help right away if you are not doing well or get worse. Document Released: 08/10/2010 Document Revised: 04/28/2013 Document Reviewed: 02/19/2013 [facility] Patient Information 2015 [place], [place]. This information is not intended to replace advice given to you by your health care provider. Make sure you discuss any questions you have with your health care provider.

## 2014-01-26 ENCOUNTER — Encounter (HOSPITAL_COMMUNITY): Payer: Self-pay | Admitting: Emergency Medicine

## 2014-01-26 MED ORDER — LORAZEPAM 0.5 MG PO TABS
0.5000 mg | ORAL_TABLET | Freq: Once | ORAL | Status: AC
  Administered 2014-01-26: 0.5 mg via ORAL
  Filled 2014-01-26: qty 1

## 2014-01-26 NOTE — ED Provider Notes (Signed)
CSN: [id number]     Arrival date & time 01/25/14  2351 History   First MD Initiated Contact with Patient 01/26/14 0035     Chief Complaint  Patient presents with  . Shortness of Breath      HPI  Patient presents for his third visit in the last 24 hours. Seen and evaluated some back pain 24 hours ago. Had an revealing evaluation. Was dialyzed early this morning. Presented again this afternoon for anxiety. Had not filled his prescription from last night for anxiety. Discharged home. He states that he was sleeping and snoring in "family was worried", they woke him up and he was brought here by EMS. States he did not fill his prescription because he didn't have a ride. Denies dyspnea. States he still feels anxious. Denies chest back neck or jaw pain. Dialyzed today without incidence.  Past Medical History  Diagnosis Date  . Hypertension   . ESRD (end stage renal disease)     due to HTN per patient, followed at [facility], s/p failed kidney transplant  . Depression   . Complication of anesthesia     itching, sore throat  . Diabetes mellitus without complication     No history per patient, but remains under history as A1c would not be accurate given on dialysis  . Shortness of breath   . Anxiety    Past Surgical History  Procedure Laterality Date  . Kidney receipient  2006    failed and started HD in March 2014  . Capd insertion    . Capd removal     History reviewed. No pertinent family history. History  Substance Use Topics  . Smoking status: Former Smoker -- 1.00 packs/day for 1 years    Types: Cigarettes  . Smokeless tobacco: Never Used     Comment: quit Jan 2014  . Alcohol Use: No    Review of Systems  Constitutional: Negative for fever, chills, diaphoresis, appetite change and fatigue.  HENT: Negative for mouth sores, sore throat and trouble swallowing.   Eyes: Negative for visual disturbance.  Respiratory: Negative for cough, chest tightness, shortness of breath and  wheezing.   Cardiovascular: Negative for chest pain.  Gastrointestinal: Negative for nausea, vomiting, abdominal pain, diarrhea and abdominal distention.  Endocrine: Negative for polydipsia, polyphagia and polyuria.  Genitourinary: Negative for dysuria, frequency and hematuria.  Musculoskeletal: Negative for gait problem.  Skin: Negative for color change, pallor and rash.  Neurological: Negative for dizziness, syncope, light-headedness and headaches.  Hematological: Does not bruise/bleed easily.  Psychiatric/Behavioral: Negative for behavioral problems and confusion. The patient is nervous/anxious.       Allergies  Ferrlecit and Darvocet  Home Medications   Prior to Admission medications   Medication Sig Start Date End Date Taking? Authorizing Provider  allopurinol (ZYLOPRIM) 100 MG tablet Take 100 mg by mouth daily.    Historical Provider, MD  amLODipine (NORVASC) 5 MG tablet Take 10 mg by mouth daily.    Historical Provider, MD  cinacalcet (SENSIPAR) 30 MG tablet Take 30 mg by mouth daily.    Historical Provider, MD  cloNIDine (CATAPRES) 0.3 MG tablet Take 0.3 mg by mouth 3 (three) times daily.    Historical Provider, MD  diazepam (VALIUM) 5 MG tablet Take 1 tablet (5 mg total) by mouth 2 (two) times daily. 01/25/14   [name], PA-C  furosemide (LASIX) 80 MG tablet Take 80 mg by mouth daily.    Historical Provider, MD  labetalol (NORMODYNE) 200 MG tablet Take  300 mg by mouth 2 (two) times daily.     Historical Provider, MD  lisinopril (PRINIVIL,ZESTRIL) 20 MG tablet Take 20 mg by mouth daily.    Historical Provider, MD  minoxidil (LONITEN) 2.5 MG tablet Take 2.5 mg by mouth daily.  10/12/13   Historical Provider, MD  omeprazole (PRILOSEC) 20 MG capsule Take 20 mg by mouth daily. 07/01/13   Historical Provider, MD  predniSONE (DELTASONE) 5 MG tablet Take 15 mg by mouth daily with breakfast.    Historical Provider, MD  promethazine (PHENERGAN) 25 MG tablet Take 1 tablet (25 mg total)  by mouth every 6 (six) hours as needed for nausea. 12/21/13   Kalman Drape, MD  sevelamer carbonate (RENVELA) 800 MG tablet Take 1,600 mg by mouth 3 (three) times daily with meals. 12/27/13 12/27/14  Historical Provider, MD  tacrolimus (PROGRAF) 5 MG capsule Take 10 mg by mouth 2 (two) times daily.     Historical Provider, MD   BP 169/125  Pulse 74  Temp(Src) 97.4 F (36.3 C) (Oral)  Resp 20  SpO2 97% Physical Exam  Constitutional: He is oriented to person, place, and time. He appears well-developed and well-nourished. No distress.  HENT:  Head: Normocephalic.  Eyes: Conjunctivae are normal. Pupils are equal, round, and reactive to light. No scleral icterus.  Neck: Normal range of motion. Neck supple. No thyromegaly present.  Cardiovascular: Normal rate and regular rhythm.  Exam reveals no gallop and no friction rub.   No murmur heard. Pulmonary/Chest: Effort normal and breath sounds normal. No respiratory distress. He has no wheezes. He has no rales.  Abdominal: Soft. Bowel sounds are normal. He exhibits no distension. There is no tenderness. There is no rebound.  Musculoskeletal: Normal range of motion.  Neurological: He is alert and oriented to person, place, and time.  Skin: Skin is warm and dry. No rash noted.  Psychiatric: He has a normal mood and affect. His behavior is normal.    ED Course  Procedures (including critical care time) Labs Review Labs Reviewed - No data to display  Imaging Review Dg Chest 2 View  01/25/2014   CLINICAL DATA:  Shortness of breath.  Diabetes and hypertension.  EXAM: CHEST  2 VIEW  COMPARISON:  01/25/2014  FINDINGS: Moderate cardiac enlargement is stable. Pulmonary vascular congestion is unchanged. No evidence of acute pulmonary infiltrate or edema. Right internal jugular central venous dialysis catheter remains in stable position.  IMPRESSION: Stable cardiac enlargement and pulmonary venous hypertension. No active lung disease.   Electronically Signed    By: Earle Gell M.D.   On: 01/25/2014 19:57   Dg Chest 2 View  01/25/2014   CLINICAL DATA:  Short of breath in upper back pain for 48 hr.  EXAM: CHEST  2 VIEW  COMPARISON:  01/19/2014  FINDINGS: Cardiac enlargement with mild central vascular congestion. No evidence of edema. No focal consolidation. Unchanged appearance of right central venous catheter. No change since prior study.  IMPRESSION: Cardiac enlargement with mild vascular congestion.  No edema.   Electronically Signed   By: Lucienne Capers M.D.   On: 01/25/2014 02:36     EKG Interpretation None      MDM   Final diagnoses:  Anxiety    As I enter the room he is asleep. He is snoring rather loudly. Pulse ox placed on the finger shows 98% with good waveform. Is not tachycardic or hypoxemic. Is not febrile with recheck oral temp. Given Ativan and he is perfectly  appropriate for discharge home without further studies here. Strongly encouraged to find a ride, fill and take his prescription this time. Recheck with any worsening revolving symptoms.    Tanna Furry, MD 01/26/14 [phone]

## 2014-01-26 NOTE — ED Notes (Signed)
Pt was seen here a few times in the last 24 hours, last time to be admitted and patient didn't stay. Pt states that the anxiety pill helped him for awhile but then he woke up and couldn't breathe

## 2014-01-26 NOTE — Discharge Instructions (Signed)
Panic Attacks Panic attacks are sudden, short-livedsurges of severe anxiety, fear, or discomfort. They may occur for no reason when you are relaxed, when you are anxious, or when you are sleeping. Panic attacks may occur for a number of reasons:   Healthy people occasionally have panic attacks in extreme, life-threatening situations, such as war or natural disasters. Normal anxiety is a protective mechanism of the body that helps [place] react to danger (fight or flight response).  Panic attacks are often seen with anxiety disorders, such as panic disorder, social anxiety disorder, generalized anxiety disorder, and phobias. Anxiety disorders cause excessive or uncontrollable anxiety. They may interfere with your relationships or other life activities.  Panic attacks are sometimes seen with other mental illnesses, such as depression and posttraumatic stress disorder.  Certain medical conditions, prescription medicines, and drugs of abuse can cause panic attacks. SYMPTOMS  Panic attacks start suddenly, peak within 20 minutes, and are accompanied by four or more of the following symptoms:  Pounding heart or fast heart rate (palpitations).  Sweating.  Trembling or shaking.  Shortness of breath or feeling smothered.  Feeling choked.  Chest pain or discomfort.  Nausea or strange feeling in your stomach.  Dizziness, light-headedness, or feeling like you will faint.  Chills or hot flushes.  Numbness or tingling in your lips or hands and feet.  Feeling that things are not real or feeling that you are not yourself.  Fear of losing control or going crazy.  Fear of dying. Some of these symptoms can mimic serious medical conditions. For example, you may think you are having a heart attack. Although panic attacks can be very scary, they are not life threatening. DIAGNOSIS  Panic attacks are diagnosed through an assessment by your health care provider. Your health care provider will ask  questions about your symptoms, such as where and when they occurred. Your health care provider will also ask about your medical history and use of alcohol and drugs, including prescription medicines. Your health care provider may order blood tests or other studies to rule out a serious medical condition. Your health care provider may refer you to a mental health professional for further evaluation. TREATMENT   Most healthy people who have one or two panic attacks in an extreme, life-threatening situation will not require treatment.  The treatment for panic attacks associated with anxiety disorders or other mental illness typically involves counseling with a mental health professional, medicine, or a combination of both. Your health care provider will help determine what treatment is best for you.  Panic attacks due to physical illness usually go away with treatment of the illness. If prescription medicine is causing panic attacks, talk with your health care provider about stopping the medicine, decreasing the dose, or substituting another medicine.  Panic attacks due to alcohol or drug abuse go away with abstinence. Some adults need professional help in order to stop drinking or using drugs. HOME CARE INSTRUCTIONS   Take all medicines as directed by your health care provider.   Schedule and attend follow-up visits as directed by your health care provider. It is important to keep all your appointments. SEEK MEDICAL CARE IF:  You are not able to take your medicines as prescribed.  Your symptoms do not improve or get worse. SEEK IMMEDIATE MEDICAL CARE IF:   You experience panic attack symptoms that are different than your usual symptoms.  You have serious thoughts about hurting yourself or others.  You are taking medicine for panic attacks and  have a serious side effect. MAKE SURE YOU:  Understand these instructions.  Will watch your condition.  Will get help right away if you are not  doing well or get worse. Document Released: 07/08/2005 Document Revised: 07/13/2013 Document Reviewed: 02/19/2013 [facility] Patient Information 2015 [place], [place]. This information is not intended to replace advice given to you by your health care provider. Make sure you discuss any questions you have with your health care provider.

## 2014-02-02 NOTE — ED Provider Notes (Signed)
Medical screening examination/treatment/procedure(s) were conducted as a shared visit with non-physician practitioner(s) and myself.  I personally evaluated the patient during the encounter.  Examined after given meds. Patient states pain is much improved, minimal tenderness to thoracic paraspinal soft tissues.    Wynetta Fines, MD 02/02/14 2236

## 2014-02-08 ENCOUNTER — Emergency Department (HOSPITAL_COMMUNITY): Payer: Medicare Other

## 2014-02-08 ENCOUNTER — Emergency Department (HOSPITAL_COMMUNITY)
Admission: EM | Admit: 2014-02-08 | Discharge: 2014-02-08 | Disposition: A | Discharge status: Home / Self Care | Payer: Medicare Other | Attending: Emergency Medicine | Admitting: Emergency Medicine

## 2014-02-08 ENCOUNTER — Encounter (HOSPITAL_COMMUNITY): Payer: Self-pay | Admitting: Emergency Medicine

## 2014-02-08 DIAGNOSIS — I509 Heart failure, unspecified: Secondary | ICD-10-CM | POA: Insufficient documentation

## 2014-02-08 DIAGNOSIS — Z79899 Other long term (current) drug therapy: Secondary | ICD-10-CM | POA: Insufficient documentation

## 2014-02-08 DIAGNOSIS — R0609 Other forms of dyspnea: Secondary | ICD-10-CM | POA: Insufficient documentation

## 2014-02-08 DIAGNOSIS — F411 Generalized anxiety disorder: Secondary | ICD-10-CM | POA: Insufficient documentation

## 2014-02-08 DIAGNOSIS — R0989 Other specified symptoms and signs involving the circulatory and respiratory systems: Principal | ICD-10-CM | POA: Insufficient documentation

## 2014-02-08 DIAGNOSIS — E119 Type 2 diabetes mellitus without complications: Secondary | ICD-10-CM | POA: Diagnosis not present

## 2014-02-08 DIAGNOSIS — I120 Hypertensive chronic kidney disease with stage 5 chronic kidney disease or end stage renal disease: Secondary | ICD-10-CM | POA: Insufficient documentation

## 2014-02-08 DIAGNOSIS — F3289 Other specified depressive episodes: Secondary | ICD-10-CM | POA: Insufficient documentation

## 2014-02-08 DIAGNOSIS — IMO0002 Reserved for concepts with insufficient information to code with codable children: Secondary | ICD-10-CM | POA: Diagnosis not present

## 2014-02-08 DIAGNOSIS — R0602 Shortness of breath: Secondary | ICD-10-CM | POA: Diagnosis present

## 2014-02-08 DIAGNOSIS — Z87891 Personal history of nicotine dependence: Secondary | ICD-10-CM | POA: Insufficient documentation

## 2014-02-08 DIAGNOSIS — N186 End stage renal disease: Secondary | ICD-10-CM | POA: Insufficient documentation

## 2014-02-08 DIAGNOSIS — F329 Major depressive disorder, single episode, unspecified: Secondary | ICD-10-CM | POA: Insufficient documentation

## 2014-02-08 DIAGNOSIS — R0600 Dyspnea, unspecified: Secondary | ICD-10-CM

## 2014-02-08 LAB — CBC
HCT: 32.3 % — ABNORMAL LOW (ref 39.0–52.0)
HEMOGLOBIN: 10.1 g/dL — AB (ref 13.0–17.0)
MCH: 26.6 pg (ref 26.0–34.0)
MCHC: 31.3 g/dL (ref 30.0–36.0)
MCV: 85 fL (ref 78.0–100.0)
PLATELETS: 180 10*3/uL (ref 150–400)
RBC: 3.8 MIL/uL — ABNORMAL LOW (ref 4.22–5.81)
RDW: 16.7 % — ABNORMAL HIGH (ref 11.5–15.5)
WBC: 5.5 10*3/uL (ref 4.0–10.5)

## 2014-02-08 LAB — BASIC METABOLIC PANEL
ANION GAP: 16 — AB (ref 5–15)
BUN: 50 mg/dL — ABNORMAL HIGH (ref 6–23)
CALCIUM: 8.8 mg/dL (ref 8.4–10.5)
CO2: 21 mEq/L (ref 19–32)
Chloride: 102 mEq/L (ref 96–112)
Creatinine, Ser: 13.08 mg/dL — ABNORMAL HIGH (ref 0.50–1.35)
GFR, EST AFRICAN AMERICAN: 4 mL/min — AB (ref >90)
GFR, EST NON AFRICAN AMERICAN: 4 mL/min — AB (ref >90)
Glucose, Bld: 102 mg/dL — ABNORMAL HIGH (ref 70–99)
POTASSIUM: 5.3 meq/L (ref 3.7–5.3)
Sodium: 139 mEq/L (ref 137–147)

## 2014-02-08 MED ORDER — IPRATROPIUM BROMIDE 0.02 % IN SOLN
0.5000 mg | Freq: Once | RESPIRATORY_TRACT | Status: AC
  Administered 2014-02-08: 0.5 mg via RESPIRATORY_TRACT
  Filled 2014-02-08: qty 2.5

## 2014-02-08 MED ORDER — ALBUTEROL SULFATE (2.5 MG/3ML) 0.083% IN NEBU
5.0000 mg | INHALATION_SOLUTION | Freq: Once | RESPIRATORY_TRACT | Status: AC
  Administered 2014-02-08: 5 mg via RESPIRATORY_TRACT
  Filled 2014-02-08: qty 6

## 2014-02-08 NOTE — Discharge Instructions (Signed)
Shortness of Breath Shortness of breath means you have trouble breathing. It could also mean that you have a medical problem. You should get immediate medical care for shortness of breath. CAUSES   Not enough oxygen in the air such as with high altitudes or a smoke-filled room.  Certain lung diseases, infections, or problems.  Heart disease or conditions, such as angina or heart failure.  Low red blood cells (anemia).  Poor physical fitness, which can cause shortness of breath when you exercise.  Chest or back injuries or stiffness.  Being overweight.  Smoking.  Anxiety, which can make you feel like you are not getting enough air. DIAGNOSIS  Serious medical problems can often be found during your physical exam. Tests may also be done to determine why you are having shortness of breath. Tests may include:  Chest X-rays.  Lung function tests.  Blood tests.  An electrocardiogram (ECG).  An ambulatory electrocardiogram. An ambulatory ECG records your heartbeat patterns over a 24-hour period.  Exercise testing.  A transthoracic echocardiogram (TTE). During echocardiography, sound waves are used to evaluate how blood flows through your heart.  A transesophageal echocardiogram (TEE).  Imaging scans. Your health care provider may not be able to find a cause for your shortness of breath after your exam. In this case, it is important to have a follow-up exam with your health care provider as directed.  TREATMENT  Treatment for shortness of breath depends on the cause of your symptoms and can vary greatly. HOME CARE INSTRUCTIONS   Do not smoke. Smoking is a common cause of shortness of breath. If you smoke, ask for help to quit.  Avoid being around chemicals or things that may bother your breathing, such as paint fumes and dust.  Rest as needed. Slowly resume your usual activities.  If medicines were prescribed, take them as directed for the full length of time directed. This  includes oxygen and any inhaled medicines.  Keep all follow-up appointments as directed by your health care provider. SEEK MEDICAL CARE IF:   Your condition does not improve in the time expected.  You have a hard time doing your normal activities even with rest.  You have any new symptoms. SEEK IMMEDIATE MEDICAL CARE IF:   Your shortness of breath gets worse.  You feel light-headed, faint, or develop a cough not controlled with medicines.  You start coughing up blood.  You have pain with breathing.  You have chest pain or pain in your arms, shoulders, or abdomen.  You have a fever.  You are unable to walk up stairs or exercise the way you normally do. MAKE SURE YOU:  Understand these instructions.  Will watch your condition.  Will get help right away if you are not doing well or get worse. Document Released: 04/02/2001 Document Revised: 07/13/2013 Document Reviewed: 09/23/2011 [facility] Patient Information 2015 [place], [place]. This information is not intended to replace advice given to you by your health care provider. Make sure you discuss any questions you have with your health care provider.

## 2014-02-08 NOTE — ED Notes (Signed)
Pt reports SOB x 2 days. Lung sounds clear,breathing labored. Airway patent. Emesis x 1 today, whitish fluid. Denies diarrhea. Denies any pain. + dizziness.

## 2014-02-08 NOTE — ED Notes (Signed)
Pt refusing to be on cardiac monitor and pulsox at this time, states he feels better walking around

## 2014-02-08 NOTE — ED Provider Notes (Signed)
CSN: [id number]     Arrival date & time 02/08/14  0105 History   First MD Initiated Contact with Patient 02/08/14 0117     Chief Complaint  Patient presents with  . Shortness of Breath      HPI Patient for shortness of breath over the past 2 days.  History of CHF and end-stage renal disease.  States he dialyzed on Saturday, 3 days ago as scheduled.  He is scheduled for dialysis this morning.  He denies productive cough.  No fevers or chills.  Denies orthopnea.  No unilateral leg swelling.  No history of DVT or PE.  Symptoms are mild in severity.  Used to smoke cigarettes but no longer does   Past Medical History  Diagnosis Date  . Hypertension   . ESRD (end stage renal disease)     due to HTN per patient, followed at [facility], s/p failed kidney transplant  . Depression   . Complication of anesthesia     itching, sore throat  . Diabetes mellitus without complication     No history per patient, but remains under history as A1c would not be accurate given on dialysis  . Shortness of breath   . Anxiety    Past Surgical History  Procedure Laterality Date  . Kidney receipient  2006    failed and started HD in March 2014  . Capd insertion    . Capd removal     No family history on file. History  Substance Use Topics  . Smoking status: Former Smoker -- 1.00 packs/day for 1 years    Types: Cigarettes  . Smokeless tobacco: Never Used     Comment: quit Jan 2014  . Alcohol Use: No    Review of Systems  All other systems reviewed and are negative.     Allergies  Ferrlecit and Darvocet  Home Medications   Prior to Admission medications   Medication Sig Start Date End Date Taking? Authorizing Provider  allopurinol (ZYLOPRIM) 100 MG tablet Take 100 mg by mouth daily.    Historical Provider, MD  amLODipine (NORVASC) 5 MG tablet Take 10 mg by mouth daily.    Historical Provider, MD  cinacalcet (SENSIPAR) 30 MG tablet Take 30 mg by mouth daily.    Historical Provider, MD   cloNIDine (CATAPRES) 0.3 MG tablet Take 0.3 mg by mouth 3 (three) times daily.    Historical Provider, MD  diazepam (VALIUM) 5 MG tablet Take 1 tablet (5 mg total) by mouth 2 (two) times daily. 01/25/14   [name], PA-C  furosemide (LASIX) 80 MG tablet Take 80 mg by mouth daily.    Historical Provider, MD  labetalol (NORMODYNE) 200 MG tablet Take 300 mg by mouth 2 (two) times daily.     Historical Provider, MD  lisinopril (PRINIVIL,ZESTRIL) 20 MG tablet Take 20 mg by mouth daily.    Historical Provider, MD  minoxidil (LONITEN) 2.5 MG tablet Take 2.5 mg by mouth daily.  10/12/13   Historical Provider, MD  omeprazole (PRILOSEC) 20 MG capsule Take 20 mg by mouth daily. 07/01/13   Historical Provider, MD  predniSONE (DELTASONE) 5 MG tablet Take 15 mg by mouth daily with breakfast.    Historical Provider, MD  promethazine (PHENERGAN) 25 MG tablet Take 1 tablet (25 mg total) by mouth every 6 (six) hours as needed for nausea. 12/21/13   Kalman Drape, MD  sevelamer carbonate (RENVELA) 800 MG tablet Take 1,600 mg by mouth 3 (three) times daily with meals.  12/27/13 12/27/14  Historical Provider, MD  tacrolimus (PROGRAF) 5 MG capsule Take 10 mg by mouth 2 (two) times daily.     Historical Provider, MD   BP 188/146  Pulse 82  Temp(Src) 98.3 F (36.8 C) (Oral)  Resp 16  Ht _0  (1.88 m)  Wt 175 lb (79.379 kg)  BMI 22.46 kg/m2  SpO2 98% Physical Exam  Nursing note and vitals reviewed. Constitutional: He is oriented to person, place, and time. He appears well-developed and well-nourished.  HENT:  Head: Normocephalic and atraumatic.  Eyes: EOM are normal.  Neck: Normal range of motion.  Cardiovascular: Normal rate, regular rhythm, normal heart sounds and intact distal pulses.   Pulmonary/Chest: Effort normal and breath sounds normal. No respiratory distress. He has no wheezes. He has no rales.  Abdominal: Soft. He exhibits no distension. There is no tenderness.  Musculoskeletal: Normal range of motion.   Neurological: He is alert and oriented to person, place, and time.  Skin: Skin is warm and dry.  Psychiatric: He has a normal mood and affect. Judgment normal.    ED Course  Procedures (including critical care time) Labs Review Labs Reviewed  CBC - Abnormal; Notable for the following:    RBC 3.80 (*)    Hemoglobin 10.1 (*)    HCT 32.3 (*)    RDW 16.7 (*)    All other components within normal limits  BASIC METABOLIC PANEL - Abnormal; Notable for the following:    Glucose, Bld 102 (*)    BUN 50 (*)    Creatinine, Ser 13.08 (*)    GFR calc non Af Amer 4 (*)    GFR calc Af Amer 4 (*)    Anion gap 16 (*)    All other components within normal limits    Imaging Review Dg Chest 2 View  02/08/2014   CLINICAL DATA:  SHORTNESS OF BREATH  EXAM: CHEST  2 VIEW  COMPARISON:  Prior radiograph from 01/25/2014  FINDINGS: Right-sided hemodialysis catheter is unchanged. Cardiomegaly is stable. Mediastinal silhouette within normal limits.  Patient is slightly rotated to the left, accentuating the right perihilar structures. No pulmonary edema or pleural effusion. No pneumothorax. No focal infiltrates identified.  No acute osseous abnormality.  IMPRESSION: 1. Stable cardiomegaly without pulmonary edema. No focal infiltrates identified.   Electronically Signed   By: Jeannine Boga M.D.   On: 02/08/2014 02:15  I personally reviewed the imaging tests through PACS system I reviewed available ER/hospitalization records through the EMR    EKG Interpretation   Date/Time:  Tuesday February 08 2014 01:13:28 EDT Ventricular Rate:  81 PR Interval:  173 QRS Duration: 84 QT Interval:  426 QTC Calculation: 494 R Axis:   -29 Text Interpretation:  Sinus rhythm Left ventricular hypertrophy  Nonspecific T abnormalities, lateral leads Borderline prolonged QT  interval No significant change was found Confirmed by Londa Mackowski  MD, Zosia Lucchese  ([id number]) on 02/08/2014 1:19:18 AM      MDM   Final diagnoses:  Dyspnea     No increased work of breathing.  Besides hypertension his vital signs are otherwise normal.  Chest x-ray shows no CHF.  Pulse ox is 98% on room air.  I think the patient will benefit from dialysis this morning as scheduled.  No indication for additional management or treatment.  Referral to [facility] pulmonary for ongoing followup.    Hoy Morn, MD 02/08/14 [phone]

## 2014-02-10 ENCOUNTER — Emergency Department (HOSPITAL_COMMUNITY)
Admission: EM | Admit: 2014-02-10 | Discharge: 2014-02-10 | Disposition: A | Discharge status: Home / Self Care | Payer: Medicare Other | Attending: Emergency Medicine | Admitting: Emergency Medicine

## 2014-02-10 ENCOUNTER — Encounter (HOSPITAL_COMMUNITY): Payer: Self-pay | Admitting: Emergency Medicine

## 2014-02-10 DIAGNOSIS — Z79899 Other long term (current) drug therapy: Secondary | ICD-10-CM | POA: Insufficient documentation

## 2014-02-10 DIAGNOSIS — N186 End stage renal disease: Secondary | ICD-10-CM | POA: Diagnosis not present

## 2014-02-10 DIAGNOSIS — E119 Type 2 diabetes mellitus without complications: Secondary | ICD-10-CM | POA: Diagnosis not present

## 2014-02-10 DIAGNOSIS — I120 Hypertensive chronic kidney disease with stage 5 chronic kidney disease or end stage renal disease: Secondary | ICD-10-CM | POA: Insufficient documentation

## 2014-02-10 DIAGNOSIS — F411 Generalized anxiety disorder: Secondary | ICD-10-CM | POA: Insufficient documentation

## 2014-02-10 DIAGNOSIS — Z87891 Personal history of nicotine dependence: Secondary | ICD-10-CM | POA: Insufficient documentation

## 2014-02-10 DIAGNOSIS — F3289 Other specified depressive episodes: Secondary | ICD-10-CM | POA: Insufficient documentation

## 2014-02-10 DIAGNOSIS — R109 Unspecified abdominal pain: Secondary | ICD-10-CM | POA: Diagnosis present

## 2014-02-10 DIAGNOSIS — IMO0002 Reserved for concepts with insufficient information to code with codable children: Secondary | ICD-10-CM | POA: Insufficient documentation

## 2014-02-10 DIAGNOSIS — Z940 Kidney transplant status: Secondary | ICD-10-CM | POA: Diagnosis not present

## 2014-02-10 DIAGNOSIS — F329 Major depressive disorder, single episode, unspecified: Secondary | ICD-10-CM | POA: Diagnosis not present

## 2014-02-10 DIAGNOSIS — R1032 Left lower quadrant pain: Secondary | ICD-10-CM

## 2014-02-10 MED ORDER — OXYCODONE-ACETAMINOPHEN 5-325 MG PO TABS
2.0000 | ORAL_TABLET | Freq: Once | ORAL | Status: AC
  Administered 2014-02-10: 2 via ORAL
  Filled 2014-02-10: qty 2

## 2014-02-10 MED ORDER — CLONIDINE HCL 0.1 MG PO TABS
0.3000 mg | ORAL_TABLET | Freq: Once | ORAL | Status: AC
  Administered 2014-02-10: 0.3 mg via ORAL
  Filled 2014-02-10: qty 3

## 2014-02-10 NOTE — ED Provider Notes (Signed)
Medical screening examination/treatment/procedure(s) were performed by non-physician practitioner and as supervising physician I was immediately available for consultation/collaboration.   EKG Interpretation None       Kalman Drape, MD 02/10/14 2036

## 2014-02-10 NOTE — ED Notes (Signed)
Pt states he fell asleep in his jeans and woke up in extreme pain where they had bound up around his groin. Denies swelling.

## 2014-02-10 NOTE — ED Notes (Signed)
Frank [name] and Dr Sharol Given both were consulted concerning pt elevated blood pressure and they stated pt was able to be discharged.

## 2014-02-10 NOTE — ED Provider Notes (Signed)
CSN: [id number]     Arrival date & time 02/10/14  0536 History   First MD Initiated Contact with Patient 02/10/14 0559     Chief Complaint  Patient presents with  . Groin Pain     (Consider location/radiation/quality/duration/timing/severity/associated sxs/prior Treatment) HPI Comments: Patient is a 48 yo M PMHx significant for HTN, end-stage renal disease, depression, DM, anxiety presented to the emergency department for left-sided groin pain that began after sleeping in his jeans last evening. Patient states the zipper was digging into his groin and is now causing pain. Alleviating factors: none. Aggravating factors: palpation. Medications tried prior to arrival: none. Denies any fevers, chills, nausea, vomiting, dysuria, urinary frequency or urgency, hematuria, diarrhea or constipation, testicular or penile pain or swelling, penile discharge. Abdominal surgical history includes kidney transplant. Patient did not take his home medications this morning.   Patient is a 48 y.o. male presenting with groin pain.  Groin Pain Pertinent negatives include no chills, fever, nausea or vomiting.    Past Medical History  Diagnosis Date  . Hypertension   . ESRD (end stage renal disease)     due to HTN per patient, followed at [facility], s/p failed kidney transplant  . Depression   . Complication of anesthesia     itching, sore throat  . Diabetes mellitus without complication     No history per patient, but remains under history as A1c would not be accurate given on dialysis  . Shortness of breath   . Anxiety    Past Surgical History  Procedure Laterality Date  . Kidney receipient  2006    failed and started HD in March 2014  . Capd insertion    . Capd removal     No family history on file. History  Substance Use Topics  . Smoking status: Former Smoker -- 1.00 packs/day for 1 years    Types: Cigarettes  . Smokeless tobacco: Never Used     Comment: quit Jan 2014  . Alcohol Use: No     Review of Systems  Constitutional: Negative for fever and chills.  Gastrointestinal: Negative for nausea, vomiting, diarrhea and constipation.  Genitourinary: Negative for dysuria, urgency, frequency, hematuria, flank pain, decreased urine volume, discharge, penile swelling, scrotal swelling, enuresis, difficulty urinating, genital sores, penile pain and testicular pain.  All other systems reviewed and are negative.     Allergies  Ferrlecit and Darvocet  Home Medications   Prior to Admission medications   Medication Sig Start Date End Date Taking? Authorizing Provider  allopurinol (ZYLOPRIM) 100 MG tablet Take 100 mg by mouth daily.   Yes Historical Provider, MD  amLODipine (NORVASC) 5 MG tablet Take 10 mg by mouth daily.   Yes Historical Provider, MD  cinacalcet (SENSIPAR) 30 MG tablet Take 30 mg by mouth daily.   Yes Historical Provider, MD  cloNIDine (CATAPRES) 0.3 MG tablet Take 0.3 mg by mouth 3 (three) times daily.   Yes Historical Provider, MD  diazepam (VALIUM) 5 MG tablet Take 1 tablet (5 mg total) by mouth 2 (two) times daily. 01/25/14  Yes [name], PA-C  furosemide (LASIX) 80 MG tablet Take 80 mg by mouth daily.   Yes Historical Provider, MD  labetalol (NORMODYNE) 200 MG tablet Take 300 mg by mouth 2 (two) times daily.    Yes Historical Provider, MD  lisinopril (PRINIVIL,ZESTRIL) 20 MG tablet Take 20 mg by mouth daily.   Yes Historical Provider, MD  minoxidil (LONITEN) 2.5 MG tablet Take 2.5 mg by mouth  daily.  10/12/13  Yes Historical Provider, MD  omeprazole (PRILOSEC) 20 MG capsule Take 20 mg by mouth daily. 07/01/13  Yes Historical Provider, MD  predniSONE (DELTASONE) 5 MG tablet Take 15 mg by mouth daily with breakfast.   Yes Historical Provider, MD  promethazine (PHENERGAN) 25 MG tablet Take 1 tablet (25 mg total) by mouth every 6 (six) hours as needed for nausea. 12/21/13  Yes Kalman Drape, MD  sevelamer carbonate (RENVELA) 800 MG tablet Take 1,600 mg by mouth 3  (three) times daily with meals. 12/27/13 12/27/14 Yes Historical Provider, MD  tacrolimus (PROGRAF) 5 MG capsule Take 10 mg by mouth 2 (two) times daily.    Yes Historical Provider, MD   BP 204/168  Pulse 76  Temp(Src) 97.8 F (36.6 C) (Oral)  Resp 20  Ht _0  (1.88 m)  Wt 175 lb (79.379 kg)  BMI 22.46 kg/m2  SpO2 97% Physical Exam  Nursing note and vitals reviewed. Constitutional: He is oriented to person, place, and time. He appears well-developed and well-nourished. No distress.  HENT:  Head: Normocephalic and atraumatic.  Right Ear: External ear normal.  Left Ear: External ear normal.  Nose: Nose normal.  Mouth/Throat: Oropharynx is clear and moist. No oropharyngeal exudate.  Eyes: Conjunctivae are normal.  Neck: Normal range of motion. Neck supple.  Cardiovascular: Normal rate, regular rhythm and normal heart sounds.   Pulmonary/Chest: Effort normal and breath sounds normal. No respiratory distress. He has no wheezes. He has no rales.  Abdominal: Soft. Bowel sounds are normal. He exhibits no distension. There is no tenderness. There is no rebound and no guarding. Hernia confirmed negative in the right inguinal area and confirmed negative in the left inguinal area.  Genitourinary: Testes normal and penis normal.    Right testis shows no mass, no swelling and no tenderness. Right testis is descended. Cremasteric reflex is not absent on the right side. Left testis shows no mass, no swelling and no tenderness. Left testis is descended. Cremasteric reflex is not absent on the left side. Uncircumcised. No phimosis, paraphimosis, hypospadias, penile erythema or penile tenderness. No discharge found.  Musculoskeletal: Normal range of motion. He exhibits no edema.  Lymphadenopathy:       Right: No inguinal adenopathy present.       Left: No inguinal adenopathy present.  Neurological: He is alert and oriented to person, place, and time.  Skin: Skin is warm and dry. He is not diaphoretic.   Psychiatric: He has a normal mood and affect.    ED Course  Procedures (including critical care time) Medications  cloNIDine (CATAPRES) tablet 0.3 mg (0.3 mg Oral Given 02/10/14 0613)  oxyCODONE-acetaminophen (PERCOCET/ROXICET) 5-325 MG per tablet 2 tablet (2 tablets Oral Given 02/10/14 0629)    Labs Review Labs Reviewed - No data to display  Imaging Review No results found.   EKG Interpretation None      MDM   Final diagnoses:  Groin pain, left    Filed Vitals:   02/10/14 0632  BP: 204/168  Pulse:   Temp:   Resp:    1) Groin pain: Abdomen soft, non-tender, non-distended. No peritoneal signs. No penile or scrotal swelling. No testicular masses. No penile discharge. No paraphimosis or phimosis. No penile or testicular tenderness. No inguinal adenopathy or hernias appreciated. No abrasions, wounds, or skin findings noted. Low suspicion for infectious etiology, torsion. Will try symptomatic measures.   2) HTN: Patient noted to be hypertensive in the emergency department. Did not take  home medications, due for dialysis today. No signs of hypertensive urgency.  No HA, SOB, CP, or other evidence of end organ damage. Discussed with patient the need for close follow-up and management by their primary care physician.   Patient d/w with Dr. Sharol Given, agrees with plan.     Harlow Mares, PA-C 02/10/14 [phone]

## 2014-02-10 NOTE — Discharge Instructions (Signed)
Please follow up with your primary care physician in 1-2 days. If you do not have one please call the [place] number listed above. Please use Tylenol at home for any further pain or discomfort. Please wear loose comfortable clothing to bed to avoid irritation. Please read all discharge instructions and return precautions.     Groin Strain A groin strain (also called a groin pull) is an injury to the muscles or tendon on the upper inner part of the thigh. These muscles are called the adductor muscles or groin muscles. They are responsible for moving the leg across the body. A muscle strain occurs when a muscle is overstretched and some muscle fibers are torn. A groin strain can range from mild to severe depending on how many muscle fibers are affected and whether the muscle fibers are partially or completely torn.  Groin strains usually occur during exercise or participation in sports. The injury often happens when a sudden, violent force is placed on a muscle, stretching the muscle too far. A strain is more likely to occur when your muscles are not warmed up or if you are not properly conditioned. Depending on the severity of the groin strain, recovery time may vary from a few weeks to several weeks. Severe injuries often require 4-6 weeks for recovery. In these cases, complete healing can take 4-5 months.  CAUSES   Stretching the groin muscles too far or too suddenly, often during side-to-side motion with an abrupt change in direction.  Putting repeated stress on the groin muscles over a long period of time.  Performing vigorous activity without properly stretching the groin muscles beforehand. SYMPTOMS   Pain and tenderness in the groin area. This begins as sharp pain and persists as a dull ache.  Popping or snapping feeling when the injury occurs (for severe strains).  Swelling or bruising.  Muscle spasms.  Weakness in the leg.  Stiffness in the groin area with  decreased ability to move the affected muscles. DIAGNOSIS  Your caregiver will perform a physical exam to diagnose a groin strain. You will be asked about your symptoms and how the injury occurred. X-rays are sometimes needed to rule out a broken bone or cartilage problems. Your caregiver may order a CT scan or MRI if a complete muscle tear is suspected. TREATMENT  A groin strain will often heal on its own. Your caregiver may prescribe medicines to help manage pain and swelling (anti-inflammatory medicine). You may be told to use crutches for the first few days to minimize your pain. HOME CARE INSTRUCTIONS   Rest. Do not use the strained muscle if it causes pain.  Put ice on the injured area.  Put ice in a plastic bag.  Place a towel between your skin and the bag.  Leave the ice on for 15-20 minutes, every 2-3 hours. Do this for the first 2 days after the injury.  Only take over-the-counter or prescription medicines as directed by your caregiver.  Wrap the injured area with an elastic bandage as directed by your caregiver.  Keep the injured leg raised (elevated).  Walk, stretch, and perform range-of-motion exercises to improve blood flow to the injured area. Only perform these activities if you can do so without any pain. To prevent muscle strains:  Warm up before exercise.  Develop proper conditioning and strength in the groin muscles. SEEK IMMEDIATE MEDICAL CARE IF:   You have increased pain or swelling in the affected area.   Your symptoms  are not improving or are getting worse. MAKE SURE YOU:   Understand these instructions.  Will watch your condition.  Will get help right away if you are not doing well or get worse. Document Released: 03/05/2004 Document Revised: 06/24/2012 Document Reviewed: 03/11/2012 [facility] Patient Information 2015 [place], [place]. This information is not intended to replace advice given to you by your health care provider. Make sure you discuss  any questions you have with your health care provider.

## 2014-03-03 ENCOUNTER — Emergency Department (HOSPITAL_COMMUNITY)
Admission: EM | Admit: 2014-03-03 | Discharge: 2014-03-03 | Disposition: A | Discharge status: Home / Self Care | Payer: Medicare Other | Attending: Emergency Medicine | Admitting: Emergency Medicine

## 2014-03-03 ENCOUNTER — Emergency Department (HOSPITAL_COMMUNITY): Payer: Medicare Other

## 2014-03-03 ENCOUNTER — Encounter (HOSPITAL_COMMUNITY): Payer: Self-pay | Admitting: Emergency Medicine

## 2014-03-03 DIAGNOSIS — E119 Type 2 diabetes mellitus without complications: Secondary | ICD-10-CM | POA: Insufficient documentation

## 2014-03-03 DIAGNOSIS — Z79899 Other long term (current) drug therapy: Secondary | ICD-10-CM | POA: Diagnosis not present

## 2014-03-03 DIAGNOSIS — J40 Bronchitis, not specified as acute or chronic: Secondary | ICD-10-CM | POA: Diagnosis not present

## 2014-03-03 DIAGNOSIS — R0602 Shortness of breath: Secondary | ICD-10-CM | POA: Insufficient documentation

## 2014-03-03 DIAGNOSIS — F329 Major depressive disorder, single episode, unspecified: Secondary | ICD-10-CM | POA: Insufficient documentation

## 2014-03-03 DIAGNOSIS — F3289 Other specified depressive episodes: Secondary | ICD-10-CM | POA: Diagnosis not present

## 2014-03-03 DIAGNOSIS — F411 Generalized anxiety disorder: Secondary | ICD-10-CM | POA: Insufficient documentation

## 2014-03-03 DIAGNOSIS — Z87891 Personal history of nicotine dependence: Secondary | ICD-10-CM | POA: Diagnosis not present

## 2014-03-03 DIAGNOSIS — Z992 Dependence on renal dialysis: Secondary | ICD-10-CM | POA: Insufficient documentation

## 2014-03-03 DIAGNOSIS — IMO0002 Reserved for concepts with insufficient information to code with codable children: Secondary | ICD-10-CM | POA: Insufficient documentation

## 2014-03-03 DIAGNOSIS — I120 Hypertensive chronic kidney disease with stage 5 chronic kidney disease or end stage renal disease: Secondary | ICD-10-CM | POA: Insufficient documentation

## 2014-03-03 DIAGNOSIS — N186 End stage renal disease: Secondary | ICD-10-CM | POA: Diagnosis not present

## 2014-03-03 LAB — CBC
HEMATOCRIT: 32 % — AB (ref 39.0–52.0)
Hemoglobin: 10 g/dL — ABNORMAL LOW (ref 13.0–17.0)
MCH: 25.2 pg — AB (ref 26.0–34.0)
MCHC: 31.3 g/dL (ref 30.0–36.0)
MCV: 80.6 fL (ref 78.0–100.0)
Platelets: 210 10*3/uL (ref 150–400)
RBC: 3.97 MIL/uL — ABNORMAL LOW (ref 4.22–5.81)
RDW: 16.7 % — ABNORMAL HIGH (ref 11.5–15.5)
WBC: 4.9 10*3/uL (ref 4.0–10.5)

## 2014-03-03 LAB — BASIC METABOLIC PANEL
Anion gap: 12 (ref 5–15)
BUN: 28 mg/dL — ABNORMAL HIGH (ref 6–23)
CALCIUM: 8.3 mg/dL — AB (ref 8.4–10.5)
CHLORIDE: 100 meq/L (ref 96–112)
CO2: 28 meq/L (ref 19–32)
CREATININE: 6.46 mg/dL — AB (ref 0.50–1.35)
GFR calc Af Amer: 11 mL/min — ABNORMAL LOW (ref >90)
GFR calc non Af Amer: 9 mL/min — ABNORMAL LOW (ref >90)
Glucose, Bld: 115 mg/dL — ABNORMAL HIGH (ref 70–99)
Potassium: 4.2 mEq/L (ref 3.7–5.3)
Sodium: 140 mEq/L (ref 137–147)

## 2014-03-03 LAB — I-STAT TROPONIN, ED: Troponin i, poc: 0.04 ng/mL (ref 0.00–0.08)

## 2014-03-03 MED ORDER — LORAZEPAM 1 MG PO TABS
0.5000 mg | ORAL_TABLET | Freq: Once | ORAL | Status: AC
  Administered 2014-03-03: 0.5 mg via ORAL
  Filled 2014-03-03: qty 1

## 2014-03-03 MED ORDER — ALBUTEROL SULFATE HFA 108 (90 BASE) MCG/ACT IN AERS
2.0000 | INHALATION_SPRAY | RESPIRATORY_TRACT | Status: DC | PRN
  Administered 2014-03-03: 2 via RESPIRATORY_TRACT
  Filled 2014-03-03: qty 6.7

## 2014-03-03 MED ORDER — ALBUTEROL SULFATE (2.5 MG/3ML) 0.083% IN NEBU
5.0000 mg | INHALATION_SOLUTION | Freq: Once | RESPIRATORY_TRACT | Status: AC
  Administered 2014-03-03: 5 mg via RESPIRATORY_TRACT
  Filled 2014-03-03: qty 6

## 2014-03-03 NOTE — ED Provider Notes (Signed)
CSN: [id number]     Arrival date & time 03/03/14  0107 History   First MD Initiated Contact with Patient 03/03/14 0351     Chief Complaint  Patient presents with  . Shortness of Breath   HPI  History provided by the patient. Patient is a 49 year old male with history of ESRD on dialysis Sunday, Tuesday and Thursday presenting with complaints of cough, shortness of breath and anxiety. Patient states he has had slight cough and congestion for the past 2 days. He did have normal full dialysis Tuesday evening. Symptoms have been persistent and he also reports some increased anxiety making symptoms even worse. Reports that he has had a productive cough sometimes with yellow phlegm. Denies any associated fever or chills. No chest pain. No other aggravating or alleviating factors. No other associated symptoms.    Past Medical History  Diagnosis Date  . Hypertension   . ESRD (end stage renal disease)     due to HTN per patient, followed at [facility], s/p failed kidney transplant  . Depression   . Complication of anesthesia     itching, sore throat  . Diabetes mellitus without complication     No history per patient, but remains under history as A1c would not be accurate given on dialysis  . Shortness of breath   . Anxiety    Past Surgical History  Procedure Laterality Date  . Kidney receipient  2006    failed and started HD in March 2014  . Capd insertion    . Capd removal     No family history on file. History  Substance Use Topics  . Smoking status: Former Smoker -- 1.00 packs/day for 1 years    Types: Cigarettes  . Smokeless tobacco: Never Used     Comment: quit Jan 2014  . Alcohol Use: No    Review of Systems  Constitutional: Negative for fever.  Respiratory: Positive for cough and shortness of breath.   Cardiovascular: Negative for chest pain and palpitations.  Gastrointestinal: Negative for nausea and vomiting.  All other systems reviewed and are  negative.     Allergies  Ferrlecit and Darvocet  Home Medications   Prior to Admission medications   Medication Sig Start Date End Date Taking? Authorizing Provider  allopurinol (ZYLOPRIM) 100 MG tablet Take 100 mg by mouth daily.    Historical Provider, MD  amLODipine (NORVASC) 5 MG tablet Take 10 mg by mouth daily.    Historical Provider, MD  cinacalcet (SENSIPAR) 30 MG tablet Take 30 mg by mouth daily.    Historical Provider, MD  cloNIDine (CATAPRES) 0.3 MG tablet Take 0.3 mg by mouth 3 (three) times daily.    Historical Provider, MD  diazepam (VALIUM) 5 MG tablet Take 1 tablet (5 mg total) by mouth 2 (two) times daily. 01/25/14   [name], PA-C  furosemide (LASIX) 80 MG tablet Take 80 mg by mouth daily.    Historical Provider, MD  labetalol (NORMODYNE) 200 MG tablet Take 300 mg by mouth 2 (two) times daily.     Historical Provider, MD  lisinopril (PRINIVIL,ZESTRIL) 20 MG tablet Take 20 mg by mouth daily.    Historical Provider, MD  minoxidil (LONITEN) 2.5 MG tablet Take 2.5 mg by mouth daily.  10/12/13   Historical Provider, MD  omeprazole (PRILOSEC) 20 MG capsule Take 20 mg by mouth daily. 07/01/13   Historical Provider, MD  predniSONE (DELTASONE) 5 MG tablet Take 15 mg by mouth daily with breakfast.  Historical Provider, MD  promethazine (PHENERGAN) 25 MG tablet Take 1 tablet (25 mg total) by mouth every 6 (six) hours as needed for nausea. 12/21/13   Kalman Drape, MD  sevelamer carbonate (RENVELA) 800 MG tablet Take 1,600 mg by mouth 3 (three) times daily with meals. 12/27/13 12/27/14  Historical Provider, MD  tacrolimus (PROGRAF) 5 MG capsule Take 10 mg by mouth 2 (two) times daily.     Historical Provider, MD   BP 162/114  Pulse 75  Temp(Src) 97.9 F (36.6 C) (Oral)  Resp 16  Ht _0  (1.88 m)  Wt 175 lb (79.379 kg)  BMI 22.46 kg/m2  SpO2 95% Physical Exam  Nursing note and vitals reviewed. Constitutional: He appears well-developed and well-nourished. No distress.  HENT:   Head: Normocephalic.  Cardiovascular: Normal rate and regular rhythm.   Dialysis catheter from right neck  Pulmonary/Chest: Effort normal. No respiratory distress. He has wheezes.  Abdominal: Soft.  Neurological: He is alert.  Skin: Skin is warm.    ED Course  Procedures   COORDINATION OF CARE:  Nursing notes reviewed. Vital signs reviewed. Initial pt interview and examination performed.   Filed Vitals:   03/03/14 0345 03/03/14 0400 03/03/14 0415 03/03/14 0430  BP: 155/118 162/133  162/114  Pulse: 77 75    Temp:      TempSrc:      Resp:      Height:      Weight:      SpO2:  98% 95%     4:52 AM-patient seen and evaluated. Patient appears well in no acute distress. He is breathing normally with good O2 sats on room air. No signs of respiratory distress. Laboratory tests and chest x-ray without concerning findings. Does have slight wheezing on expiration. We'll give second breathing treatment.  Patient continues to be breathing well. He is intermittently falling asleep. Does appear to have some possible issues with sleep apnea and snoring. I did discuss this with the patient and instructed that he should also consider following up with his regular provider for further evaluation of this. Wheezing has improved on lungs. Will plan to give albuterol inhaler to take home. Strict return precautions given.   Treatment plan initiated: Medications  albuterol (PROVENTIL HFA;VENTOLIN HFA) 108 (90 BASE) MCG/ACT inhaler 2 puff (not administered)  albuterol (PROVENTIL) (2.5 MG/3ML) 0.083% nebulizer solution 5 mg (5 mg Nebulization Given 03/03/14 0141)  LORazepam (ATIVAN) tablet 0.5 mg (0.5 mg Oral Given 03/03/14 0250)  albuterol (PROVENTIL) (2.5 MG/3ML) 0.083% nebulizer solution 5 mg (5 mg Nebulization Given 03/03/14 0501)    Results for orders placed during the hospital encounter of 15/83/09  BASIC METABOLIC PANEL      Result Value Ref Range   Sodium 140  137 - 147 mEq/L   Potassium 4.2   3.7 - 5.3 mEq/L   Chloride 100  96 - 112 mEq/L   CO2 28  19 - 32 mEq/L   Glucose, Bld 115 (*) 70 - 99 mg/dL   BUN 28 (*) 6 - 23 mg/dL   Creatinine, Ser 6.46 (*) 0.50 - 1.35 mg/dL   Calcium 8.3 (*) 8.4 - 10.5 mg/dL   GFR calc non Af Amer 9 (*) >90 mL/min   GFR calc Af Amer 11 (*) >90 mL/min   Anion gap 12  5 - 15  CBC      Result Value Ref Range   WBC 4.9  4.0 - 10.5 K/uL   RBC 3.97 (*) 4.22 - 5.81  MIL/uL   Hemoglobin 10.0 (*) 13.0 - 17.0 g/dL   HCT 32.0 (*) 39.0 - 52.0 %   MCV 80.6  78.0 - 100.0 fL   MCH 25.2 (*) 26.0 - 34.0 pg   MCHC 31.3  30.0 - 36.0 g/dL   RDW 16.7 (*) 11.5 - 15.5 %   Platelets 210  150 - 400 K/uL  I-STAT TROPOININ, ED      Result Value Ref Range   Troponin i, poc 0.04  0.00 - 0.08 ng/mL   Comment 3                Imaging Review Dg Chest 2 View (if Patient Has Fever And/or Copd)  03/03/2014   CLINICAL DATA:  Shortness of breath.  Cough.  EXAM: CHEST  2 VIEW  COMPARISON:  Chest x-ray 02/08/2014.  FINDINGS: Right internal jugular PermCath with tip terminating in the superior aspect of the right atrium. Mild cardiomegaly. Lung volumes are normal. No consolidative airspace disease. No pleural effusions. No pneumothorax. No pulmonary nodule or mass noted. Pulmonary vasculature is normal. Upper mediastinal contours are within normal limits.  IMPRESSION: 1. No radiographic evidence of acute cardiopulmonary disease. 2. Mild cardiomegaly.   Electronically Signed   By: Vinnie Langton M.D.   On: 03/03/2014 01:57     EKG Interpretation None      Date: 03/03/2014  Rate: 87  Rhythm: normal sinus rhythm  QRS Axis: normal  Intervals: QT prolonged  ST/T Wave abnormalities: nonspecific ST/T changes  Conduction Disutrbances:left anterior fascicular block  Narrative Interpretation: Left atrial enlargement, LVH  Old EKG Reviewed: unchanged      MDM   Final diagnoses:  Bronchitis        Martie Lee, PA-C 03/03/14 0559

## 2014-03-03 NOTE — Discharge Instructions (Signed)
Use the albuterol inhaler as instructed by taking 1-2 puffs every 4 hours as needed for shortness of breath. Followup with your primary care provider for continued evaluation and treatment of your symptoms.  Acute Bronchitis Bronchitis is when the airways that extend from the windpipe into the lungs get red, puffy, and painful (inflamed). Bronchitis often causes thick spit (mucus) to develop. This leads to a cough. A cough is the most common symptom of bronchitis. In acute bronchitis, the condition usually begins suddenly and goes away over time (usually in 2 weeks). Smoking, allergies, and asthma can make bronchitis worse. Repeated episodes of bronchitis may cause more lung problems. HOME CARE  Rest.  Drink enough fluids to keep your pee (urine) clear or pale yellow (unless you need to limit fluids as told by your doctor).  Only take over-the-counter or prescription medicines as told by your doctor.  Avoid smoking and secondhand smoke. These can make bronchitis worse. If you are a smoker, think about using nicotine gum or skin patches. Quitting smoking will help your lungs heal faster.  Reduce the chance of getting bronchitis again by:  Washing your hands often.  Avoiding people with cold symptoms.  Trying not to touch your hands to your mouth, nose, or eyes.  Follow up with your doctor as told. GET HELP IF: Your symptoms do not improve after 1 week of treatment. Symptoms include:  Cough.  Fever.  Coughing up thick spit.  Body aches.  Chest congestion.  Chills.  Shortness of breath.  Sore throat. GET HELP RIGHT AWAY IF:   You have an increased fever.  You have chills.  You have severe shortness of breath.  You have bloody thick spit (sputum).  You throw up (vomit) often.  You lose too much body fluid (dehydration).  You have a severe headache.  You faint. MAKE SURE YOU:   Understand these instructions.  Will watch your condition.  Will get help right  away if you are not doing well or get worse. Document Released: 12/25/2007 Document Revised: 03/10/2013 Document Reviewed: 12/29/2012 [facility] Patient Information 2015 [place], [place]. This information is not intended to replace advice given to you by your health care provider. Make sure you discuss any questions you have with your health care provider.

## 2014-03-03 NOTE — ED Provider Notes (Signed)
Medical screening examination/treatment/procedure(s) were performed by non-physician practitioner and as supervising physician I was immediately available for consultation/collaboration.   Delora Fuel, MD 30/94/07 6808

## 2014-03-03 NOTE — ED Notes (Signed)
Pt presents with shortness of breath since having dialysis treatment yesterday, admits to having full treatment of dialysis yesterday- pt speaking in full sentences and ambulatory at triage.  Admits to productive cough yellow in color.

## 2014-03-27 ENCOUNTER — Emergency Department (HOSPITAL_COMMUNITY): Payer: Medicare Other

## 2014-03-27 ENCOUNTER — Encounter (HOSPITAL_COMMUNITY): Payer: Self-pay | Admitting: Emergency Medicine

## 2014-03-27 ENCOUNTER — Observation Stay (HOSPITAL_COMMUNITY)
Admission: EM | Admit: 2014-03-27 | Discharge: 2014-03-27 | Disposition: A | Discharge status: Home / Self Care | Payer: Medicare Other | Attending: Internal Medicine | Admitting: Internal Medicine

## 2014-03-27 DIAGNOSIS — N186 End stage renal disease: Secondary | ICD-10-CM | POA: Diagnosis not present

## 2014-03-27 DIAGNOSIS — I120 Hypertensive chronic kidney disease with stage 5 chronic kidney disease or end stage renal disease: Secondary | ICD-10-CM | POA: Diagnosis not present

## 2014-03-27 DIAGNOSIS — Z79899 Other long term (current) drug therapy: Secondary | ICD-10-CM | POA: Insufficient documentation

## 2014-03-27 DIAGNOSIS — Z992 Dependence on renal dialysis: Secondary | ICD-10-CM | POA: Insufficient documentation

## 2014-03-27 DIAGNOSIS — IMO0002 Reserved for concepts with insufficient information to code with codable children: Secondary | ICD-10-CM | POA: Diagnosis not present

## 2014-03-27 DIAGNOSIS — R42 Dizziness and giddiness: Secondary | ICD-10-CM | POA: Insufficient documentation

## 2014-03-27 DIAGNOSIS — F3289 Other specified depressive episodes: Secondary | ICD-10-CM | POA: Diagnosis not present

## 2014-03-27 DIAGNOSIS — F329 Major depressive disorder, single episode, unspecified: Secondary | ICD-10-CM | POA: Diagnosis not present

## 2014-03-27 DIAGNOSIS — I10 Essential (primary) hypertension: Secondary | ICD-10-CM

## 2014-03-27 DIAGNOSIS — E119 Type 2 diabetes mellitus without complications: Secondary | ICD-10-CM | POA: Diagnosis not present

## 2014-03-27 DIAGNOSIS — D649 Anemia, unspecified: Secondary | ICD-10-CM | POA: Diagnosis not present

## 2014-03-27 DIAGNOSIS — R51 Headache: Principal | ICD-10-CM

## 2014-03-27 DIAGNOSIS — R519 Headache, unspecified: Secondary | ICD-10-CM

## 2014-03-27 DIAGNOSIS — I160 Hypertensive urgency: Secondary | ICD-10-CM | POA: Diagnosis present

## 2014-03-27 DIAGNOSIS — F411 Generalized anxiety disorder: Secondary | ICD-10-CM | POA: Diagnosis not present

## 2014-03-27 DIAGNOSIS — Z87891 Personal history of nicotine dependence: Secondary | ICD-10-CM | POA: Diagnosis not present

## 2014-03-27 LAB — CBC
HEMATOCRIT: 31.5 % — AB (ref 39.0–52.0)
HEMOGLOBIN: 9.9 g/dL — AB (ref 13.0–17.0)
MCH: 24.6 pg — ABNORMAL LOW (ref 26.0–34.0)
MCHC: 31.4 g/dL (ref 30.0–36.0)
MCV: 78.2 fL (ref 78.0–100.0)
Platelets: 180 10*3/uL (ref 150–400)
RBC: 4.03 MIL/uL — ABNORMAL LOW (ref 4.22–5.81)
RDW: 17.5 % — ABNORMAL HIGH (ref 11.5–15.5)
WBC: 7.1 10*3/uL (ref 4.0–10.5)

## 2014-03-27 LAB — BASIC METABOLIC PANEL
ANION GAP: 15 (ref 5–15)
BUN: 34 mg/dL — AB (ref 6–23)
CO2: 22 meq/L (ref 19–32)
CREATININE: 8.73 mg/dL — AB (ref 0.50–1.35)
Calcium: 8.9 mg/dL (ref 8.4–10.5)
Chloride: 102 mEq/L (ref 96–112)
GFR calc Af Amer: 7 mL/min — ABNORMAL LOW (ref >90)
GFR calc non Af Amer: 6 mL/min — ABNORMAL LOW (ref >90)
GLUCOSE: 96 mg/dL (ref 70–99)
Potassium: 3.9 mEq/L (ref 3.7–5.3)
Sodium: 139 mEq/L (ref 137–147)

## 2014-03-27 MED ORDER — PANTOPRAZOLE SODIUM 40 MG PO TBEC
40.0000 mg | DELAYED_RELEASE_TABLET | Freq: Every day | ORAL | Status: DC
  Administered 2014-03-27: 40 mg via ORAL
  Filled 2014-03-27: qty 1

## 2014-03-27 MED ORDER — CLONIDINE HCL 0.3 MG PO TABS
0.3000 mg | ORAL_TABLET | Freq: Three times a day (TID) | ORAL | Status: DC
Start: 2014-03-27 — End: 2014-03-27
  Administered 2014-03-27 (×2): 0.3 mg via ORAL
  Filled 2014-03-27 (×3): qty 1

## 2014-03-27 MED ORDER — ONDANSETRON HCL 4 MG PO TABS: 4.0000 mg | ORAL_TABLET | Freq: Four times a day (QID) | ORAL | Status: DC | PRN

## 2014-03-27 MED ORDER — SODIUM CHLORIDE 0.9 % IJ SOLN
3.0000 mL | Freq: Two times a day (BID) | INTRAMUSCULAR | Status: DC
  Administered 2014-03-27: 3 mL via INTRAVENOUS

## 2014-03-27 MED ORDER — TACROLIMUS 1 MG PO CAPS
10.0000 mg | ORAL_CAPSULE | Freq: Two times a day (BID) | ORAL | Status: DC
  Administered 2014-03-27: 10 mg via ORAL
  Filled 2014-03-27 (×2): qty 10

## 2014-03-27 MED ORDER — HEPARIN SODIUM (PORCINE) 5000 UNIT/ML IJ SOLN
5000.0000 [IU] | Freq: Three times a day (TID) | INTRAMUSCULAR | Status: DC
  Administered 2014-03-27: 5000 [IU] via SUBCUTANEOUS

## 2014-03-27 MED ORDER — CLONIDINE HCL 0.2 MG PO TABS
0.2000 mg | ORAL_TABLET | Freq: Once | ORAL | Status: AC
  Administered 2014-03-27: 0.2 mg via ORAL
  Filled 2014-03-27: qty 1

## 2014-03-27 MED ORDER — OXYCODONE HCL 5 MG PO TABS: 5.0000 mg | ORAL_TABLET | Freq: Four times a day (QID) | ORAL | Status: DC | PRN

## 2014-03-27 MED ORDER — PREDNISONE 5 MG PO TABS
15.0000 mg | ORAL_TABLET | Freq: Every day | ORAL | Status: DC
  Administered 2014-03-27: 15 mg via ORAL
  Filled 2014-03-27 (×2): qty 1

## 2014-03-27 MED ORDER — AMLODIPINE BESYLATE 10 MG PO TABS
10.0000 mg | ORAL_TABLET | Freq: Every day | ORAL | Status: DC
  Administered 2014-03-27: 10 mg via ORAL
  Filled 2014-03-27: qty 1

## 2014-03-27 MED ORDER — LABETALOL HCL 100 MG PO TABS
100.0000 mg | ORAL_TABLET | Freq: Two times a day (BID) | ORAL | Status: DC
  Filled 2014-03-27 (×2): qty 1

## 2014-03-27 MED ORDER — HYDRALAZINE HCL 50 MG PO TABS
75.0000 mg | ORAL_TABLET | Freq: Three times a day (TID) | ORAL | Status: DC
  Administered 2014-03-27 (×2): 75 mg via ORAL
  Filled 2014-03-27 (×3): qty 1

## 2014-03-27 MED ORDER — MINOXIDIL 2.5 MG PO TABS
2.5000 mg | ORAL_TABLET | Freq: Every day | ORAL | Status: DC
  Administered 2014-03-27: 2.5 mg via ORAL
  Filled 2014-03-27: qty 1

## 2014-03-27 MED ORDER — ONDANSETRON HCL 4 MG/2ML IJ SOLN: 4.0000 mg | Freq: Three times a day (TID) | INTRAMUSCULAR | Status: DC | PRN

## 2014-03-27 MED ORDER — RENA-VITE PO TABS: 1.0000 | ORAL_TABLET | Freq: Every day | ORAL | Status: DC

## 2014-03-27 MED ORDER — DOCUSATE SODIUM 100 MG PO CAPS
100.0000 mg | ORAL_CAPSULE | Freq: Two times a day (BID) | ORAL | Status: DC
  Administered 2014-03-27: 100 mg via ORAL
  Filled 2014-03-27: qty 1

## 2014-03-27 MED ORDER — ONDANSETRON HCL 4 MG/2ML IJ SOLN: 4.0000 mg | Freq: Four times a day (QID) | INTRAMUSCULAR | Status: DC | PRN

## 2014-03-27 MED ORDER — HYDROMORPHONE HCL PF 1 MG/ML IJ SOLN
1.0000 mg | Freq: Once | INTRAMUSCULAR | Status: AC
  Administered 2014-03-27: 1 mg via INTRAVENOUS
  Filled 2014-03-27: qty 1

## 2014-03-27 MED ORDER — LISINOPRIL 20 MG PO TABS
20.0000 mg | ORAL_TABLET | Freq: Every day | ORAL | Status: DC
  Administered 2014-03-27: 20 mg via ORAL
  Filled 2014-03-27: qty 1

## 2014-03-27 MED ORDER — TACROLIMUS 1 MG PO CAPS: 10.0000 mg | ORAL_CAPSULE | Freq: Two times a day (BID) | ORAL | Status: DC

## 2014-03-27 MED ORDER — ALLOPURINOL 100 MG PO TABS
100.0000 mg | ORAL_TABLET | Freq: Every day | ORAL | Status: DC
  Administered 2014-03-27: 100 mg via ORAL
  Filled 2014-03-27: qty 1

## 2014-03-27 MED ORDER — PREDNISONE 5 MG PO TABS
15.0000 mg | ORAL_TABLET | Freq: Every day | ORAL | Status: DC
  Filled 2014-03-27: qty 1

## 2014-03-27 MED ORDER — LABETALOL HCL 5 MG/ML IV SOLN
20.0000 mg | Freq: Once | INTRAVENOUS | Status: DC
  Filled 2014-03-27: qty 4

## 2014-03-27 MED ORDER — FUROSEMIDE 80 MG PO TABS
80.0000 mg | ORAL_TABLET | Freq: Every day | ORAL | Status: DC
  Administered 2014-03-27: 80 mg via ORAL
  Filled 2014-03-27: qty 1

## 2014-03-27 MED ORDER — SEVELAMER CARBONATE 800 MG PO TABS
1600.0000 mg | ORAL_TABLET | Freq: Three times a day (TID) | ORAL | Status: DC
  Administered 2014-03-27: 800 mg via ORAL
  Administered 2014-03-27: 1600 mg via ORAL
  Filled 2014-03-27 (×4): qty 2

## 2014-03-27 MED ORDER — CINACALCET HCL 30 MG PO TABS
30.0000 mg | ORAL_TABLET | Freq: Every day | ORAL | Status: DC
  Administered 2014-03-27: 30 mg via ORAL
  Filled 2014-03-27 (×2): qty 1

## 2014-03-27 MED ORDER — DIAZEPAM 5 MG PO TABS: 5.0000 mg | ORAL_TABLET | Freq: Two times a day (BID) | ORAL | Status: DC

## 2014-03-27 MED ORDER — METOCLOPRAMIDE HCL 5 MG/ML IJ SOLN
10.0000 mg | Freq: Once | INTRAMUSCULAR | Status: AC
  Administered 2014-03-27: 10 mg via INTRAVENOUS
  Filled 2014-03-27: qty 2

## 2014-03-27 NOTE — ED Provider Notes (Signed)
CSN: [id number]     Arrival date & time 03/27/14  0444 History   First MD Initiated Contact with Patient 03/27/14 0510     Chief Complaint  Patient presents with  . Headache  . Dizziness     (Consider location/radiation/quality/duration/timing/severity/associated sxs/prior Treatment) HPI Comments: 48 year old male with history of end-stage renal disease, hyperkalemia, high blood pressure malignant, end-stage renal disease on dialysis Tuesday Thursday Saturday, no recent missed dialysis, anemia presents with headache. Patient was started on hydralazine and isosorbide dinitrate for blood pressure has been taking regularly since Tuesday. Patient has had persistent generalized headache, gradual onset, severe since taking it. Patient was told he would have a headache however he cannot stand the pain. Mild nausea, no vomiting, no neuro symptoms.  Patient is a 47 y.o. male presenting with headaches and dizziness. The history is provided by the patient.  Headache Associated symptoms: dizziness   Associated symptoms: no abdominal pain, no back pain, no congestion, no fever, no neck pain, no neck stiffness, no numbness and no vomiting   Dizziness Associated symptoms: headaches   Associated symptoms: no chest pain, no shortness of breath and no vomiting     Past Medical History  Diagnosis Date  . Hypertension   . ESRD (end stage renal disease)     due to HTN per patient, followed at [facility], s/p failed kidney transplant  . Depression   . Complication of anesthesia     itching, sore throat  . Diabetes mellitus without complication     No history per patient, but remains under history as A1c would not be accurate given on dialysis  . Shortness of breath   . Anxiety    Past Surgical History  Procedure Laterality Date  . Kidney receipient  2006    failed and started HD in March 2014  . Capd insertion    . Capd removal     History reviewed. No pertinent family history. History  Substance  Use Topics  . Smoking status: Former Smoker -- 1.00 packs/day for 1 years    Types: Cigarettes  . Smokeless tobacco: Never Used     Comment: quit Jan 2014  . Alcohol Use: No    Review of Systems  Constitutional: Negative for fever and chills.  HENT: Negative for congestion.   Eyes: Negative for visual disturbance.  Respiratory: Negative for shortness of breath.   Cardiovascular: Negative for chest pain.  Gastrointestinal: Negative for vomiting and abdominal pain.  Genitourinary: Negative for dysuria and flank pain.  Musculoskeletal: Negative for back pain, gait problem, neck pain and neck stiffness.  Skin: Negative for rash.  Neurological: Positive for dizziness and headaches. Negative for weakness, light-headedness and numbness.      Allergies  Ferrlecit and Darvocet  Home Medications   Prior to Admission medications   Medication Sig Start Date End Date Taking? Authorizing Provider  allopurinol (ZYLOPRIM) 100 MG tablet Take 100 mg by mouth daily.    Historical Provider, MD  amLODipine (NORVASC) 5 MG tablet Take 10 mg by mouth daily.    Historical Provider, MD  cinacalcet (SENSIPAR) 30 MG tablet Take 30 mg by mouth daily.    Historical Provider, MD  cloNIDine (CATAPRES) 0.3 MG tablet Take 0.3 mg by mouth 3 (three) times daily.    Historical Provider, MD  diazepam (VALIUM) 5 MG tablet Take 1 tablet (5 mg total) by mouth 2 (two) times daily. 01/25/14   [name], PA-C  furosemide (LASIX) 80 MG tablet Take 80 mg by  mouth daily.    Historical Provider, MD  labetalol (NORMODYNE) 200 MG tablet Take 300 mg by mouth 2 (two) times daily.     Historical Provider, MD  lisinopril (PRINIVIL,ZESTRIL) 20 MG tablet Take 20 mg by mouth daily.    Historical Provider, MD  minoxidil (LONITEN) 2.5 MG tablet Take 2.5 mg by mouth daily.  10/12/13   Historical Provider, MD  omeprazole (PRILOSEC) 20 MG capsule Take 20 mg by mouth daily. 07/01/13   Historical Provider, MD  predniSONE (DELTASONE) 5 MG  tablet Take 15 mg by mouth daily with breakfast.    Historical Provider, MD  sevelamer carbonate (RENVELA) 800 MG tablet Take 1,600 mg by mouth 3 (three) times daily with meals. 12/27/13 12/27/14  Historical Provider, MD  tacrolimus (PROGRAF) 5 MG capsule Take 10 mg by mouth 2 (two) times daily.     Historical Provider, MD   BP 187/110  Pulse 94  Temp(Src) 98 F (36.7 C) (Oral)  Resp 22  Ht 6' 1.5" (1.867 m)  Wt 172 lb (78.019 kg)  BMI 22.38 kg/m2  SpO2 100% Physical Exam  Nursing note and vitals reviewed. Constitutional: He is oriented to person, place, and time. He appears well-developed and well-nourished.  HENT:  Head: Normocephalic and atraumatic.  Eyes: Conjunctivae are normal. Right eye exhibits no discharge. Left eye exhibits no discharge.  Neck: Normal range of motion. Neck supple. No tracheal deviation present.  Cardiovascular: Normal rate and regular rhythm.   Pulmonary/Chest: Effort normal and breath sounds normal.  Abdominal: Soft. He exhibits no distension. There is no tenderness. There is no guarding.  Musculoskeletal: He exhibits no edema.  Neurological: He is alert and oriented to person, place, and time. GCS eye subscore is 4. GCS verbal subscore is 5. GCS motor subscore is 6.  Patient has equal strength bilateral upper and lower extremity 5+, sensation intact upper and lower extremities. Cranial nerves intact, extraocular muscle function intact, neck supple no meningismus.  Skin: Skin is warm. No rash noted.  Psychiatric: He has a normal mood and affect.    ED Course  Procedures (including critical care time) Labs Review Labs Reviewed  BASIC METABOLIC PANEL - Abnormal; Notable for the following:    BUN 34 (*)    Creatinine, Ser 8.73 (*)    GFR calc non Af Amer 6 (*)    GFR calc Af Amer 7 (*)    All other components within normal limits  CBC - Abnormal; Notable for the following:    RBC 4.03 (*)    Hemoglobin 9.9 (*)    HCT 31.5 (*)    MCH 24.6 (*)    RDW  17.5 (*)    All other components within normal limits    Imaging Review Ct Head Wo Contrast  03/27/2014   CLINICAL DATA:  Severe headaches for 4 days. Hypertension. Dizziness.  EXAM: CT HEAD WITHOUT CONTRAST  TECHNIQUE: Contiguous axial images were obtained from the base of the skull through the vertex without intravenous contrast.  COMPARISON:  08/31/2012  FINDINGS: Technically limited study due to patient rotation. Ventricles and sulci appear symmetrical. No mass effect or midline shift. No abnormal extra-axial fluid collections. Gray-white matter junctions are distinct. Basal cisterns are not effaced. No evidence of acute intracranial hemorrhage. No depressed skull fractures. Opacification of right maxillary antrum.  IMPRESSION: No acute intracranial abnormalities. Inflammatory changes in the paranasal sinuses.   Electronically Signed   By: Lucienne Capers M.D.   On: 03/27/2014 06:15  EKG Interpretation None     EKG reviewed heart rate 93, left atrial enlargement, mild QT prolongation, LVH, nonspecific ST changes, sinus MDM   Final diagnoses:  Essential hypertension  Headache, unspecified headache type  ESRD (end stage renal disease)   anemia  Patient with worsening constant headache since blood pressure medications, likely the cause. Blood pressure significantly elevated in ED, CT head to rule out signs of bleeding. Pain medicines, blood work pending. Antiemetics ordered.  Patient's pain improved significantly, CT scan unremarkable. Blood pressure still significantly elevated and patient has had difficulty controlling his blood pressure in the past.  Patient's manual blood pressure diastolic 802 range. Clonidine ordered. Discussed the case with hospitalist who agrees with observation/admission. CT scan results unremarkable.  Filed Vitals:   03/27/14 0515 03/27/14 0545 03/27/14 0637 03/27/14 0638  BP: 191/145  193/146   Pulse: 98 98  88  Temp:      TempSrc:      Resp: _0 Height:      Weight:      SpO2: 100% 98%  100%        Mariea Clonts, MD 03/27/14 [phone]

## 2014-03-27 NOTE — Progress Notes (Signed)
Patient left AMA, spoke to Dr. Daleen Bo via phone, stated he was not happy with the way his medication timings were changed and patient did not want to get receive dialysis here because our machines were different. He also stated his diagnosis was incorrect and that he was only here for a headache, iv removed, patient ambulated to his car, AMA paper signed.

## 2014-03-27 NOTE — Consult Note (Signed)
[name] Renal Consultation Note  Indication for Consultation:  Management of ESRD/hemodialysis; anemia, hypertension/volume and secondary hyperparathyroidism  HPI: Frank [name] is a 48 y.o. male admitted by [facility] team with HTN urgency. He has a history of renal  transplant >8 yrs ago) and now  on HD >1.5 yrs Followed by [name] on HD TTSun  Per pt "NOCTURNAL for 7 hours  and came to [facility] ER because closet  hospital to get pain med's for my Headache."  He has a history of noncompliance  to medical  advice, refusing permanent HD access/ leaving op TXs early and no shows for op HD.He states he has not missed any recent treatments and last HD was Thursday on schedule.Denies  sob ,chest pain , nausea, vomiting , fevers, chills, and missing any bp meds.       Past Medical History  Diagnosis Date  . Hypertension   . ESRD (end stage renal disease)     due to HTN per patient, followed at [facility], s/p failed kidney transplant  . Depression   . Complication of anesthesia     itching, sore throat  . Diabetes mellitus without complication     No history per patient, but remains under history as A1c would not be accurate given on dialysis  . Shortness of breath   . Anxiety     Past Surgical History  Procedure Laterality Date  . Kidney receipient  2006    failed and started HD in March 2014  . Capd insertion    . Capd removal       History reviewed. No pertinent family history.    reports that he has quit smoking. His smoking use included Cigarettes. He has a 1 pack-year smoking history. He has never used smokeless tobacco. He reports that he does not drink alcohol or use illicit drugs.   Allergies  Allergen Reactions  . Ferrlecit [Na Ferric Gluc Cplx In Sucrose] Shortness Of Breath, Swelling and Other (See Comments)    Swelling in throat  . Darvocet [Propoxyphene N-Acetaminophen] Hives    Prior to Admission medications   Medication Sig Start Date End Date  Taking? Authorizing Provider  allopurinol (ZYLOPRIM) 100 MG tablet Take 100 mg by mouth daily.   Yes Historical Provider, MD  amLODipine (NORVASC) 5 MG tablet Take 10 mg by mouth daily.   Yes Historical Provider, MD  cinacalcet (SENSIPAR) 30 MG tablet Take 30 mg by mouth daily.   Yes Historical Provider, MD  cloNIDine (CATAPRES) 0.3 MG tablet Take 0.3 mg by mouth 3 (three) times daily.   Yes Historical Provider, MD  diazepam (VALIUM) 5 MG tablet Take 1 tablet (5 mg total) by mouth 2 (two) times daily. 01/25/14  Yes [name], PA-C  furosemide (LASIX) 80 MG tablet Take 80 mg by mouth daily.   Yes Historical Provider, MD  hydrALAZINE (APRESOLINE) 25 MG tablet Take 50 mg by mouth 3 (three) times daily.   Yes Historical Provider, MD  isosorbide dinitrate (ISORDIL) 40 MG tablet Take 40 mg by mouth every 8 (eight) hours.   Yes Historical Provider, MD  lisinopril (PRINIVIL,ZESTRIL) 20 MG tablet Take 20 mg by mouth daily.   Yes Historical Provider, MD  minoxidil (LONITEN) 2.5 MG tablet Take 2.5 mg by mouth daily.  10/12/13  Yes Historical Provider, MD  omeprazole (PRILOSEC) 20 MG capsule Take 20 mg by mouth daily. 07/01/13  Yes Historical Provider, MD  predniSONE (DELTASONE) 5 MG tablet Take 15 mg by  mouth daily with breakfast.   Yes Historical Provider, MD  sevelamer carbonate (RENVELA) 800 MG tablet Take 1,600 mg by mouth 3 (three) times daily with meals. 12/27/13 12/27/14 Yes Historical Provider, MD  tacrolimus (PROGRAF) 5 MG capsule Take 10 mg by mouth 2 (two) times daily.    Yes Historical Provider, MD     Anti-infectives   None      Results for orders placed during the hospital encounter of 03/27/14 (from the past 48 hour(s))  BASIC METABOLIC PANEL     Status: Abnormal   Collection Time    03/27/14  5:46 AM      Result Value Ref Range   Sodium 139  137 - 147 mEq/L   Potassium 3.9  3.7 - 5.3 mEq/L   Chloride 102  96 - 112 mEq/L   CO2 22  19 - 32 mEq/L   Glucose, Bld 96  70 - 99 mg/dL   BUN 34  (*) 6 - 23 mg/dL   Creatinine, Ser 8.73 (*) 0.50 - 1.35 mg/dL   Calcium 8.9  8.4 - 10.5 mg/dL   GFR calc non Af Amer 6 (*) >90 mL/min   GFR calc Af Amer 7 (*) >90 mL/min   Comment: (NOTE)     The eGFR has been calculated using the CKD EPI equation.     This calculation has not been validated in all clinical situations.     eGFR's persistently <90 mL/min signify possible Chronic Kidney     Disease.   Anion gap 15  5 - 15  CBC     Status: Abnormal   Collection Time    03/27/14  5:46 AM      Result Value Ref Range   WBC 7.1  4.0 - 10.5 K/uL   RBC 4.03 (*) 4.22 - 5.81 MIL/uL   Hemoglobin 9.9 (*) 13.0 - 17.0 g/dL   HCT 31.5 (*) 39.0 - 52.0 %   MCV 78.2  78.0 - 100.0 fL   MCH 24.6 (*) 26.0 - 34.0 pg   MCHC 31.4  30.0 - 36.0 g/dL   RDW 17.5 (*) 11.5 - 15.5 %   Platelets 180  150 - 400 K/uL     ROS: see pos. In hpi  Physical Exam: Filed Vitals:   03/27/14 1442  BP: 171/117  Pulse: 93  Temp: 97.8 F (36.6 C)  Resp: 18     General: Alert BM,NAD HEENT: , EOMI, MMM Neck: no jvd Heart: RRR No mur, gallop, or rub Lungs: Sl decr at bases otherwise CTA Abdomen: BS pos. Soft, nt, nd, no tenderness atTX site Extremities: NO pedal edema Skin: no  overt rash Neuro: Alert, OX3, no gross focal deficits/ no asterixis Dialysis Access: R ij perm cath   Dialysis Orders: Center: [facility]  on TTSunday per pt history NOCTURNAL . EDW "170 lb" ~~77.0 kg HD Bath /  Time ""7 hrs"" Heparin /. Access / BFR / DFR /    Zemplar / mcg IV/HD Epogen /   Units IV/HD  Venofer  /  Other /  Assessment/Plan 1. HTN Urgency= TH Admit managing with Meds, no excess volume or need for urgent HD, ?? Compliance with meds with his history in past of Noncompliance and last admit not giving appropriate/ accurate  history to [place]  2. ESRD -  HD TTS  K and vol okay as dw Dr. Florene Glen 3.5 hrs HD in am 3. SP FAiled renal tx - still on meds, managed by  His  Op Nephrology ([facility]) continue 4. Hypertension/volume  -  Vol. Okay , ^BP as above , ?Adherance with meds 5. Anemia  - HGB9.9 fu am labs and need for esa 6. Metabolic bone disease -  Sensipar and Renvela  Home meds continue 7. Nutrition - Renal vitamin and esrd diet use 8. PAST HO Nonadherent to Medical advice  Ernest Haber, PA-C [place] [phone] 03/27/2014, 3:04 PM  Abigail Teall C

## 2014-03-27 NOTE — H&P (Signed)
[facility] History and Physical  Frank [name] WPT:[id number] DOB: 14-Jul-1966 DOA: 03/27/2014  Referring physician:  PCP: Marcelle Overlie [name]  Specialists:   Chief Complaint: headaches   HPI: Frank [name] is a 48 y.o. male with PMH of HTN, HPL, ESRD (s/p transplant >8 yrs ago) on HD >1.5 yrs on TTS presented with intermittent headaches for few days after starting NTG+Hydralazine; Patient also noted have elevated BP, uncontrolled at home, did not respond well to meds inED and hospitalist called for admission -patient denies SOB, no chest pain, no nausea, vomiting, diarrhea, no abdominal pain, no focal neurological symptoms denies weakness, or tingling, no ataxia   Review of Systems: The patient denies anorexia, fever, weight loss,, vision loss, decreased hearing, hoarseness, chest pain, syncope, dyspnea on exertion, peripheral edema, balance deficits, hemoptysis, abdominal pain, melena, hematochezia, severe indigestion/heartburn, hematuria, incontinence, genital sores, muscle weakness, suspicious skin lesions, transient blindness, difficulty walking, depression, unusual weight change, abnormal bleeding, enlarged lymph nodes, angioedema, and breast masses.   Past Medical History  Diagnosis Date  . Hypertension   . ESRD (end stage renal disease)     due to HTN per patient, followed at [facility], s/p failed kidney transplant  . Depression   . Complication of anesthesia     itching, sore throat  . Diabetes mellitus without complication     No history per patient, but remains under history as A1c would not be accurate given on dialysis  . Shortness of breath   . Anxiety    Past Surgical History  Procedure Laterality Date  . Kidney receipient  2006    failed and started HD in March 2014  . Capd insertion    . Capd removal     Social History:  reports that he has quit smoking. His smoking use included Cigarettes. He has a 1 pack-year smoking history. He has never used smokeless tobacco.  He reports that he does not drink alcohol or use illicit drugs. Home;  where does patient live--home, ALF, SNF? and with whom if at home? Yes;  Can patient participate in ADLs?  Allergies  Allergen Reactions  . Ferrlecit [Na Ferric Gluc Cplx In Sucrose] Shortness Of Breath, Swelling and Other (See Comments)    Swelling in throat  . Darvocet [Propoxyphene N-Acetaminophen] Hives    History reviewed. No pertinent family history.  (be sure to complete)  Prior to Admission medications   Medication Sig Start Date End Date Taking? Authorizing Provider  allopurinol (ZYLOPRIM) 100 MG tablet Take 100 mg by mouth daily.   Yes Historical Provider, MD  amLODipine (NORVASC) 5 MG tablet Take 10 mg by mouth daily.   Yes Historical Provider, MD  cinacalcet (SENSIPAR) 30 MG tablet Take 30 mg by mouth daily.   Yes Historical Provider, MD  cloNIDine (CATAPRES) 0.3 MG tablet Take 0.3 mg by mouth 3 (three) times daily.   Yes Historical Provider, MD  diazepam (VALIUM) 5 MG tablet Take 1 tablet (5 mg total) by mouth 2 (two) times daily. 01/25/14  Yes [name], PA-C  furosemide (LASIX) 80 MG tablet Take 80 mg by mouth daily.   Yes Historical Provider, MD  hydrALAZINE (APRESOLINE) 25 MG tablet Take 50 mg by mouth 3 (three) times daily.   Yes Historical Provider, MD  isosorbide dinitrate (ISORDIL) 40 MG tablet Take 40 mg by mouth every 8 (eight) hours.   Yes Historical Provider, MD  lisinopril (PRINIVIL,ZESTRIL) 20 MG tablet Take 20 mg by mouth daily.   Yes Historical Provider,  MD  minoxidil (LONITEN) 2.5 MG tablet Take 2.5 mg by mouth daily.  10/12/13  Yes Historical Provider, MD  omeprazole (PRILOSEC) 20 MG capsule Take 20 mg by mouth daily. 07/01/13  Yes Historical Provider, MD  predniSONE (DELTASONE) 5 MG tablet Take 15 mg by mouth daily with breakfast.   Yes Historical Provider, MD  sevelamer carbonate (RENVELA) 800 MG tablet Take 1,600 mg by mouth 3 (three) times daily with meals. 12/27/13 12/27/14 Yes Historical  Provider, MD  tacrolimus (PROGRAF) 5 MG capsule Take 10 mg by mouth 2 (two) times daily.    Yes Historical Provider, MD   Physical Exam: Filed Vitals:   03/27/14 0745  BP: 192/136  Pulse:   Temp:   Resp:      General:  alert  Eyes: eom-i, perrla   ENT: no oral ulcers   Neck: supple   Cardiovascular: s1,s2 rrr  Respiratory: few crackles in LL  Abdomen: soft, nt,nd   Skin: soft, few ulcers   Musculoskeletal: no LE edeam  Psychiatric: no hallucinations   Neurologic: CN 2-12 intact, motor 5/5 BL  Labs on Admission:  Basic Metabolic Panel:  Recent Labs Lab 03/27/14 0546  NA 139  K 3.9  CL 102  CO2 22  GLUCOSE 96  BUN 34*  CREATININE 8.73*  CALCIUM 8.9   Liver Function Tests: No results found for this basename: AST, ALT, ALKPHOS, BILITOT, PROT, ALBUMIN,  in the last 168 hours No results found for this basename: LIPASE, AMYLASE,  in the last 168 hours No results found for this basename: AMMONIA,  in the last 168 hours CBC:  Recent Labs Lab 03/27/14 0546  WBC 7.1  HGB 9.9*  HCT 31.5*  MCV 78.2  PLT 180   Cardiac Enzymes: No results found for this basename: CKTOTAL, CKMB, CKMBINDEX, TROPONINI,  in the last 168 hours  BNP (last 3 results)  Recent Labs  12/12/13 1641 01/02/14 0317 01/18/14 0048  PROBNP >70000.0* >70000.0* >70000.0*   CBG: No results found for this basename: GLUCAP,  in the last 168 hours  Radiological Exams on Admission: Ct Head Wo Contrast  03/27/2014   CLINICAL DATA:  Severe headaches for 4 days. Hypertension. Dizziness.  EXAM: CT HEAD WITHOUT CONTRAST  TECHNIQUE: Contiguous axial images were obtained from the base of the skull through the vertex without intravenous contrast.  COMPARISON:  08/31/2012  FINDINGS: Technically limited study due to patient rotation. Ventricles and sulci appear symmetrical. No mass effect or midline shift. No abnormal extra-axial fluid collections. Gray-white matter junctions are distinct. Basal  cisterns are not effaced. No evidence of acute intracranial hemorrhage. No depressed skull fractures. Opacification of right maxillary antrum.  IMPRESSION: No acute intracranial abnormalities. Inflammatory changes in the paranasal sinuses.   Electronically Signed   By: Lucienne Capers M.D.   On: 03/27/2014 06:15   Dg Chest Portable 1 View  03/27/2014   CLINICAL DATA:  Headache, end-stage renal disease, shortness of breath  EXAM: PORTABLE CHEST - 1 VIEW  COMPARISON:  03/03/2014; 02/08/2014; 01/25/2014  FINDINGS: Grossly unchanged enlarged cardiac silhouette and mediastinal contours. Stable position of support apparatus. Overall improved aeration of the lungs. No new focal airspace opacities. No pleural effusion or pneumothorax. No evidence of edema.  IMPRESSION: 1.  No acute cardiopulmonary disease. 2. Overall improved aeration along suggests resolving edema and atelectasis.   Electronically Signed   By: Sandi Mariscal M.D.   On: 03/27/2014 07:47    EKG: Independently reviewed.   Assessment/Plan Principal Problem:  Hypertensive urgency Active Problems:   ESRD on dialysis- Tues, Thurs, Sat in [employer], nephro: [name]   HTN (hypertension)  48 y.o. male with PMH of HTN, HPL, AoCD, ESRD (s/p transplant >8 yrs ago) on HD >1.5 yrs on TTS presented with intermittent headaches for few days after starting NTG+Hydralazine;   1. HTN urgency;  -Resume clonidine 0.3 TID, cont lisinopril 20;  norvacs 10 mg, minoxidil, increasedHydralazine 50-->75 TID; add labetalol; hold NTG with headaches  -consulted nephrologist   2. Headaches likely due to uncontrolled BP, and NTG; neuro exam no  Focal; CT head: no acute findings;  -control BP, hold NTG, control pain  3. ESRD (s/p transplant >8 yrs ago) on HD >1.5 yrs on TTS; consulted nephrologist  4. AoCD/renal; no s/s of acute bleeding; cont management per nephrology     Nephrology;  if consultant consulted, please document name and whether formally or  informally consulted  Code Status: full (must indicate code status--if unknown or must be presumed, indicate so) Family Communication: d/w patient (indicate person spoken with, if applicable, with phone number if by telephone) Disposition Plan: home 24-48 hrs  (indicate anticipated LOS)  Time spent: >35 minutes   [name] [facility] Pager [phone]  If 7PM-7AM, please contact night-coverage [email or web address] Password [email or web address] 03/27/2014, 8:12 AM

## 2014-03-27 NOTE — ED Notes (Signed)
Report called to [facility]

## 2014-03-27 NOTE — Progress Notes (Signed)
Pt. BP 171/117 MD  made aware, orders to give schedule med's early.

## 2014-03-27 NOTE — Progress Notes (Signed)
Pt arrived to the room, [id number] oriented to the room. Questions answered.

## 2014-03-27 NOTE — Consult Note (Signed)
[name] Renal Consultation Note  Indication for Consultation:  Management of ESRD/hemodialysis; anemia, hypertension/volume and secondary hyperparathyroidism  HPI: Frank [name] is a 48 y.o. male admitted by [name] team with HTN urgency. He has a history of renal  transplant >8 yrs ago) and now  on HD >1.5 yrs Followed by [name] on HD TTSun  Per pt "NOCTURNAL for 7 hours  and came to [name] ER because closet  hospital to get pain med's for my Headache."  He has a history of noncompliance  to medical  advice, refusing permanent HD access/ leaving op TXs early and no shows for op HD.He states he has not missed any recent treatments and last HD was Thursday on schedule.Denies  sob ,chest pain , nausea, vomiting , fevers, chills, and missing any bp meds.       Past Medical History  Diagnosis Date  . Hypertension   . ESRD (end stage renal disease)     due to HTN per patient, followed at [name], s/p failed kidney transplant  . Depression   . Complication of anesthesia     itching, sore throat  . Diabetes mellitus without complication     No history per patient, but remains under history as A1c would not be accurate given on dialysis  . Shortness of breath   . Anxiety     Past Surgical History  Procedure Laterality Date  . Kidney receipient  2006    failed and started HD in March 2014  . Capd insertion    . Capd removal       History reviewed. No pertinent family history.    reports that he has quit smoking. His smoking use included Cigarettes. He has a 1 pack-year smoking history. He has never used smokeless tobacco. He reports that he does not drink alcohol or use illicit drugs.   Allergies  Allergen Reactions  . Ferrlecit [Na Ferric Gluc Cplx In Sucrose] Shortness Of Breath, Swelling and Other (See Comments)    Swelling in throat  . Darvocet [Propoxyphene N-Acetaminophen] Hives    Prior to Admission medications   Medication Sig Start Date End Date  Taking? Authorizing Provider  allopurinol (ZYLOPRIM) 100 MG tablet Take 100 mg by mouth daily.   Yes Historical Provider, MD  amLODipine (NORVASC) 5 MG tablet Take 10 mg by mouth daily.   Yes Historical Provider, MD  cinacalcet (SENSIPAR) 30 MG tablet Take 30 mg by mouth daily.   Yes Historical Provider, MD  cloNIDine (CATAPRES) 0.3 MG tablet Take 0.3 mg by mouth 3 (three) times daily.   Yes Historical Provider, MD  diazepam (VALIUM) 5 MG tablet Take 1 tablet (5 mg total) by mouth 2 (two) times daily. 01/25/14  Yes [name], PA-C  furosemide (LASIX) 80 MG tablet Take 80 mg by mouth daily.   Yes Historical Provider, MD  hydrALAZINE (APRESOLINE) 25 MG tablet Take 50 mg by mouth 3 (three) times daily.   Yes Historical Provider, MD  isosorbide dinitrate (ISORDIL) 40 MG tablet Take 40 mg by mouth every 8 (eight) hours.   Yes Historical Provider, MD  lisinopril (PRINIVIL,ZESTRIL) 20 MG tablet Take 20 mg by mouth daily.   Yes Historical Provider, MD  minoxidil (LONITEN) 2.5 MG tablet Take 2.5 mg by mouth daily.  10/12/13  Yes Historical Provider, MD  omeprazole (PRILOSEC) 20 MG capsule Take 20 mg by mouth daily. 07/01/13  Yes Historical Provider, MD  predniSONE (DELTASONE) 5 MG tablet Take 15 mg by  mouth daily with breakfast.   Yes Historical Provider, MD  sevelamer carbonate (RENVELA) 800 MG tablet Take 1,600 mg by mouth 3 (three) times daily with meals. 12/27/13 12/27/14 Yes Historical Provider, MD  tacrolimus (PROGRAF) 5 MG capsule Take 10 mg by mouth 2 (two) times daily.    Yes Historical Provider, MD     Anti-infectives   None      Results for orders placed during the hospital encounter of 03/27/14 (from the past 48 hour(s))  BASIC METABOLIC PANEL     Status: Abnormal   Collection Time    03/27/14  5:46 AM      Result Value Ref Range   Sodium 139  137 - 147 mEq/L   Potassium 3.9  3.7 - 5.3 mEq/L   Chloride 102  96 - 112 mEq/L   CO2 22  19 - 32 mEq/L   Glucose, Bld 96  70 - 99 mg/dL   BUN 34  (*) 6 - 23 mg/dL   Creatinine, Ser 8.73 (*) 0.50 - 1.35 mg/dL   Calcium 8.9  8.4 - 10.5 mg/dL   GFR calc non Af Amer 6 (*) >90 mL/min   GFR calc Af Amer 7 (*) >90 mL/min   Comment: (NOTE)     The eGFR has been calculated using the CKD EPI equation.     This calculation has not been validated in all clinical situations.     eGFR's persistently <90 mL/min signify possible Chronic Kidney     Disease.   Anion gap 15  5 - 15  CBC     Status: Abnormal   Collection Time    03/27/14  5:46 AM      Result Value Ref Range   WBC 7.1  4.0 - 10.5 K/uL   RBC 4.03 (*) 4.22 - 5.81 MIL/uL   Hemoglobin 9.9 (*) 13.0 - 17.0 g/dL   HCT 31.5 (*) 39.0 - 52.0 %   MCV 78.2  78.0 - 100.0 fL   MCH 24.6 (*) 26.0 - 34.0 pg   MCHC 31.4  30.0 - 36.0 g/dL   RDW 17.5 (*) 11.5 - 15.5 %   Platelets 180  150 - 400 K/uL     ROS: see pos. In hpi  Physical Exam: Filed Vitals:   03/27/14 1745  BP: 163/101  Pulse: 83  Temp: 98.2 F (36.8 C)  Resp: 17     General: Alert BM,NAD HEENT: [place], EOMI, MMM Neck: no jvd Heart: RRR No mur, gallop, or rub Lungs: Sl decr at bases otherwise CTA Abdomen: BS pos. Soft, nt, nd, no tenderness atTX site Extremities: NO pedal edema Skin: no  overt rash Neuro: Alert, OX3, no gross focal deficits/ no asterixis Dialysis Access: R ij perm cath   Dialysis Orders: Center: [facility]  on TTSunday per pt history NOCTURNAL . EDW "170 lb" ~~77.0 kg HD Bath /  Time ""7 hrs"" Heparin /. Access / BFR / DFR /    Zemplar / mcg IV/HD Epogen /   Units IV/HD  Venofer  /  Other /  Assessment/Plan 1. HTN Urgency= TH Admit managing with Meds, no excess volume or need for urgent HD, ?? Compliance with meds with his history in past of Noncompliance and last admit not giving appropriate/ accurate  history to [place]  2. ESRD -  HD TTS  K and vol okay as dw Dr. Florene Glen 3.5 hrs HD in am 3. SP FAiled renal tx - still on meds, managed by  His  Op Nephrology ([facility]) continue 4. Hypertension/volume  -  Vol. Okay , ^BP as above , ?Adherance with meds 5. Anemia  - HGB9.9 fu am labs and need for esa 6. Metabolic bone disease -  Sensipar and Renvela  Home meds continue 7. Nutrition - Renal vitamin and esrd diet use 8. PAST HO Nonadherent to Medical advice  Ernest Haber, PA-C [place] [phone] 03/27/2014, 5:59 PM   Renal Attending: Nonadherence to medical therapy. Missed HD today. Will plan hemodialysis in AM . Ava Tangney C

## 2014-03-27 NOTE — ED Notes (Signed)
Patient here with complaint of headache which he states began "this past Thursday". Explains that his doctors started him on isosorbide and hydralazine last week; they told him that he may have a headache for a short time, but this has been ongoing for several days. Goes on to describe the pain starting in his upper head, moving into his neck, and eventually hurting in his eyes. Additionally ESRD present; patient explains that he has been getting all regularly scheduled treatments with the most recent being yesterday, and has not missed a treatment.

## 2014-03-28 NOTE — Discharge Summary (Addendum)
Physician Discharge Summary  Frank [name] YQM:[id number] DOB: [birth year] DOA: 03/27/2014  PCP: Marcelle Overlie [name]  Admit date: 03/27/2014 Discharge date: 03/27/2014  Time spent: >35 minutes  Recommendations for Outpatient Follow-up:  Left AMA  Discharge Diagnoses:  Principal Problem:   Hypertensive urgency Active Problems:   ESRD on dialysis- Tues, Thurs, Sat in [facility], nephro: [name]   HTN (hypertension)   Discharge Condition: left AMA  Diet recommendation: left AMA  Filed Weights   03/27/14 0502  Weight: 78.019 kg (172 lb)    History of present illness:  48 y.o. male with PMH of HTN, HPL, AoCD, ESRD (s/p transplant >8 yrs ago) on HD >1.5 yrs on TTS presented with intermittent headaches for few days after starting NTG+Hydralazine; H/O non adherence    Hospital Course:  1. HTN urgency;  -Resumed clonidine 0.3 TID, cont lisinopril 20; norvacs 10 mg, minoxidil, increasedHydralazine 50-->75 TID; add labetalol; hold NTG with headaches  -consulted nephrologist  -BP was improving on the above regimen, But patient suddenly decided to leave AMA  2. Headaches likely due to uncontrolled BP, and NTG; neuro exam no Focal; CT head: no acute findings;  -left AMA 3. ESRD (s/p transplant >8 yrs ago) on HD >1.5 yrs on TTS; consulted nephrologist  -Pt left AMA 4. AoCD/renal; no s/s of acute bleeding; cont management per nephrology  -left AMA    Procedures:  none (i.e. Studies not automatically included, echos, thoracentesis, etc; not x-rays)  Consultations:  Nephrology   Discharge Exam: Filed Vitals:   03/27/14 1745  BP: 163/101  Pulse: 83  Temp: 98.2 F (36.8 C)  Resp: 17    General: alert on admission Cardiovascular: s1,s2 rrr on admission Respiratory: CTA BL on admission  Discharge Instructions     Medication List    ASK your doctor about these medications       allopurinol 100 MG tablet  Commonly known as:  ZYLOPRIM  Take 100 mg by mouth daily.      amLODipine 5 MG tablet  Commonly known as:  NORVASC  Take 10 mg by mouth daily.     cinacalcet 30 MG tablet  Commonly known as:  SENSIPAR  Take 30 mg by mouth daily.     cloNIDine 0.3 MG tablet  Commonly known as:  CATAPRES  Take 0.3 mg by mouth 3 (three) times daily.     diazepam 5 MG tablet  Commonly known as:  VALIUM  Take 1 tablet (5 mg total) by mouth 2 (two) times daily.     furosemide 80 MG tablet  Commonly known as:  LASIX  Take 80 mg by mouth daily.     hydrALAZINE 25 MG tablet  Commonly known as:  APRESOLINE  Take 50 mg by mouth 3 (three) times daily.     isosorbide dinitrate 40 MG tablet  Commonly known as:  ISORDIL  Take 40 mg by mouth every 8 (eight) hours.     lisinopril 20 MG tablet  Commonly known as:  PRINIVIL,ZESTRIL  Take 20 mg by mouth daily.     minoxidil 2.5 MG tablet  Commonly known as:  LONITEN  Take 2.5 mg by mouth daily.     omeprazole 20 MG capsule  Commonly known as:  PRILOSEC  Take 20 mg by mouth daily.     predniSONE 5 MG tablet  Commonly known as:  DELTASONE  Take 15 mg by mouth daily with breakfast.     RENVELA 800 MG tablet  Generic drug:  sevelamer carbonate  Take 1,600 mg by mouth 3 (three) times daily with meals.     tacrolimus 5 MG capsule  Commonly known as:  PROGRAF  Take 10 mg by mouth 2 (two) times daily.       Allergies  Allergen Reactions  . Ferrlecit [Na Ferric Gluc Cplx In Sucrose] Shortness Of Breath, Swelling and Other (See Comments)    Swelling in throat  . Darvocet [Propoxyphene N-Acetaminophen] Hives      The results of significant diagnostics from this hospitalization (including imaging, microbiology, ancillary and laboratory) are listed below for reference.    Significant Diagnostic Studies: Dg Chest 2 View (if Patient Has Fever And/or Copd)  03/03/2014   CLINICAL DATA:  Shortness of breath.  Cough.  EXAM: CHEST  2 VIEW  COMPARISON:  Chest x-ray 02/08/2014.  FINDINGS: Right internal jugular  PermCath with tip terminating in the superior aspect of the right atrium. Mild cardiomegaly. Lung volumes are normal. No consolidative airspace disease. No pleural effusions. No pneumothorax. No pulmonary nodule or mass noted. Pulmonary vasculature is normal. Upper mediastinal contours are within normal limits.  IMPRESSION: 1. No radiographic evidence of acute cardiopulmonary disease. 2. Mild cardiomegaly.   Electronically Signed   By: Vinnie Langton M.D.   On: 03/03/2014 01:57   Ct Head Wo Contrast  03/27/2014   CLINICAL DATA:  Severe headaches for 4 days. Hypertension. Dizziness.  EXAM: CT HEAD WITHOUT CONTRAST  TECHNIQUE: Contiguous axial images were obtained from the base of the skull through the vertex without intravenous contrast.  COMPARISON:  08/31/2012  FINDINGS: Technically limited study due to patient rotation. Ventricles and sulci appear symmetrical. No mass effect or midline shift. No abnormal extra-axial fluid collections. Gray-white matter junctions are distinct. Basal cisterns are not effaced. No evidence of acute intracranial hemorrhage. No depressed skull fractures. Opacification of right maxillary antrum.  IMPRESSION: No acute intracranial abnormalities. Inflammatory changes in the paranasal sinuses.   Electronically Signed   By: Lucienne Capers M.D.   On: 03/27/2014 06:15   Dg Chest Portable 1 View  03/27/2014   CLINICAL DATA:  Headache, end-stage renal disease, shortness of breath  EXAM: PORTABLE CHEST - 1 VIEW  COMPARISON:  03/03/2014; 02/08/2014; 01/25/2014  FINDINGS: Grossly unchanged enlarged cardiac silhouette and mediastinal contours. Stable position of support apparatus. Overall improved aeration of the lungs. No new focal airspace opacities. No pleural effusion or pneumothorax. No evidence of edema.  IMPRESSION: 1.  No acute cardiopulmonary disease. 2. Overall improved aeration along suggests resolving edema and atelectasis.   Electronically Signed   By: Sandi Mariscal M.D.   On:  03/27/2014 07:47    Microbiology: No results found for this or any previous visit (from the past 240 hour(s)).   Labs: Basic Metabolic Panel:  Recent Labs Lab 03/27/14 0546  NA 139  K 3.9  CL 102  CO2 22  GLUCOSE 96  BUN 34*  CREATININE 8.73*  CALCIUM 8.9   Liver Function Tests: No results found for this basename: AST, ALT, ALKPHOS, BILITOT, PROT, ALBUMIN,  in the last 168 hours No results found for this basename: LIPASE, AMYLASE,  in the last 168 hours No results found for this basename: AMMONIA,  in the last 168 hours CBC:  Recent Labs Lab 03/27/14 0546  WBC 7.1  HGB 9.9*  HCT 31.5*  MCV 78.2  PLT 180   Cardiac Enzymes: No results found for this basename: CKTOTAL, CKMB, CKMBINDEX, TROPONINI,  in the last 168 hours BNP: BNP (last  3 results)  Recent Labs  12/12/13 1641 01/02/14 0317 01/18/14 0048  PROBNP >70000.0* >70000.0* >70000.0*   CBG: No results found for this basename: GLUCAP,  in the last 168 hours     Signed:  Rowe Clack N  [facility] 03/28/2014, 11:50 AM

## 2014-04-03 ENCOUNTER — Encounter (HOSPITAL_COMMUNITY): Payer: Self-pay | Admitting: Emergency Medicine

## 2014-04-03 ENCOUNTER — Emergency Department (HOSPITAL_COMMUNITY)
Admission: EM | Admit: 2014-04-03 | Discharge: 2014-04-03 | Disposition: A | Discharge status: Home / Self Care | Payer: Medicare Other | Source: Home / Self Care | Attending: Emergency Medicine | Admitting: Emergency Medicine

## 2014-04-03 DIAGNOSIS — Z79899 Other long term (current) drug therapy: Secondary | ICD-10-CM

## 2014-04-03 DIAGNOSIS — E119 Type 2 diabetes mellitus without complications: Secondary | ICD-10-CM

## 2014-04-03 DIAGNOSIS — Z992 Dependence on renal dialysis: Secondary | ICD-10-CM

## 2014-04-03 DIAGNOSIS — Z87891 Personal history of nicotine dependence: Secondary | ICD-10-CM | POA: Insufficient documentation

## 2014-04-03 DIAGNOSIS — F411 Generalized anxiety disorder: Secondary | ICD-10-CM

## 2014-04-03 DIAGNOSIS — N186 End stage renal disease: Secondary | ICD-10-CM

## 2014-04-03 DIAGNOSIS — M25519 Pain in unspecified shoulder: Secondary | ICD-10-CM

## 2014-04-03 DIAGNOSIS — R51 Headache: Secondary | ICD-10-CM | POA: Diagnosis not present

## 2014-04-03 DIAGNOSIS — I120 Hypertensive chronic kidney disease with stage 5 chronic kidney disease or end stage renal disease: Secondary | ICD-10-CM

## 2014-04-03 DIAGNOSIS — IMO0002 Reserved for concepts with insufficient information to code with codable children: Secondary | ICD-10-CM

## 2014-04-03 DIAGNOSIS — I150 Renovascular hypertension: Secondary | ICD-10-CM

## 2014-04-03 DIAGNOSIS — R519 Headache, unspecified: Secondary | ICD-10-CM

## 2014-04-03 LAB — BASIC METABOLIC PANEL
Anion gap: 18 — ABNORMAL HIGH (ref 5–15)
BUN: 51 mg/dL — ABNORMAL HIGH (ref 6–23)
CHLORIDE: 99 meq/L (ref 96–112)
CO2: 22 meq/L (ref 19–32)
CREATININE: 10.88 mg/dL — AB (ref 0.50–1.35)
Calcium: 9.4 mg/dL (ref 8.4–10.5)
GFR calc Af Amer: 6 mL/min — ABNORMAL LOW (ref >90)
GFR calc non Af Amer: 5 mL/min — ABNORMAL LOW (ref >90)
GLUCOSE: 101 mg/dL — AB (ref 70–99)
Potassium: 4.4 mEq/L (ref 3.7–5.3)
Sodium: 139 mEq/L (ref 137–147)

## 2014-04-03 LAB — CBC WITH DIFFERENTIAL/PLATELET
Basophils Absolute: 0 10*3/uL (ref 0.0–0.1)
Basophils Relative: 0 % (ref 0–1)
Eosinophils Absolute: 0.2 10*3/uL (ref 0.0–0.7)
Eosinophils Relative: 3 % (ref 0–5)
HCT: 34.1 % — ABNORMAL LOW (ref 39.0–52.0)
HEMOGLOBIN: 10.8 g/dL — AB (ref 13.0–17.0)
LYMPHS PCT: 23 % (ref 12–46)
Lymphs Abs: 1.5 10*3/uL (ref 0.7–4.0)
MCH: 24.1 pg — ABNORMAL LOW (ref 26.0–34.0)
MCHC: 31.7 g/dL (ref 30.0–36.0)
MCV: 76.1 fL — ABNORMAL LOW (ref 78.0–100.0)
MONO ABS: 0.8 10*3/uL (ref 0.1–1.0)
Monocytes Relative: 13 % — ABNORMAL HIGH (ref 3–12)
NEUTROS ABS: 3.9 10*3/uL (ref 1.7–7.7)
Neutrophils Relative %: 61 % (ref 43–77)
Platelets: 265 10*3/uL (ref 150–400)
RBC: 4.48 MIL/uL (ref 4.22–5.81)
RDW: 17.7 % — ABNORMAL HIGH (ref 11.5–15.5)
WBC: 6.4 10*3/uL (ref 4.0–10.5)

## 2014-04-03 LAB — TROPONIN I: Troponin I: <0.3 ng/mL (ref <0.30)

## 2014-04-03 MED ORDER — DIPHENHYDRAMINE HCL 50 MG/ML IJ SOLN
25.0000 mg | Freq: Once | INTRAMUSCULAR | Status: AC
  Administered 2014-04-03: 25 mg via INTRAVENOUS
  Filled 2014-04-03: qty 1

## 2014-04-03 MED ORDER — CLONIDINE HCL 0.1 MG PO TABS
0.3000 mg | ORAL_TABLET | Freq: Once | ORAL | Status: AC
  Administered 2014-04-03: 0.3 mg via ORAL
  Filled 2014-04-03: qty 3

## 2014-04-03 MED ORDER — LORAZEPAM 1 MG PO TABS
1.0000 mg | ORAL_TABLET | Freq: Once | ORAL | Status: AC
  Administered 2014-04-03: 1 mg via ORAL
  Filled 2014-04-03: qty 1

## 2014-04-03 MED ORDER — PROCHLORPERAZINE EDISYLATE 5 MG/ML IJ SOLN
10.0000 mg | Freq: Four times a day (QID) | INTRAMUSCULAR | Status: DC | PRN
  Administered 2014-04-03: 10 mg via INTRAVENOUS
  Filled 2014-04-03: qty 2

## 2014-04-03 MED ORDER — HYDRALAZINE HCL 20 MG/ML IJ SOLN
5.0000 mg | Freq: Once | INTRAMUSCULAR | Status: AC
  Administered 2014-04-03: 5 mg via INTRAVENOUS
  Filled 2014-04-03: qty 1

## 2014-04-03 NOTE — Discharge Instructions (Signed)
You were evaluated today for headache. He continued to remain very hypertensive. While headache resolved, your blood pressure did not improve. You need to followup very closely with her cardiologist.  If you have any new or worsening symptoms, you should return immediately.  Hypertension Hypertension, commonly called high blood pressure, is when the force of blood pumping through your arteries is too strong. Your arteries are the blood vessels that carry blood from your heart throughout your body. A blood pressure reading consists of a higher number over a lower number, such as 110/72. The higher number (systolic) is the pressure inside your arteries when your heart pumps. The lower number (diastolic) is the pressure inside your arteries when your heart relaxes. Ideally you want your blood pressure below 120/80. Hypertension forces your heart to work harder to pump blood. Your arteries may become narrow or stiff. Having hypertension puts you at risk for heart disease, stroke, and other problems.  RISK FACTORS Some risk factors for high blood pressure are controllable. Others are not.  Risk factors you cannot control include:   Race. You may be at higher risk if you are African American.  Age. Risk increases with age.  Gender. Men are at higher risk than women before age 15 years. After age 76, women are at higher risk than men. Risk factors you can control include:  Not getting enough exercise or physical activity.  Being overweight.  Getting too much fat, sugar, calories, or salt in your diet.  Drinking too much alcohol. SIGNS AND SYMPTOMS Hypertension does not usually cause signs or symptoms. Extremely high blood pressure (hypertensive crisis) may cause headache, anxiety, shortness of breath, and nosebleed. DIAGNOSIS  To check if you have hypertension, your health care provider will measure your blood pressure while you are seated, with your arm held at the level of your heart. It should  be measured at least twice using the same arm. Certain conditions can cause a difference in blood pressure between your right and left arms. A blood pressure reading that is higher than normal on one occasion does not mean that you need treatment. If one blood pressure reading is high, ask your health care provider about having it checked again. TREATMENT  Treating high blood pressure includes making lifestyle changes and possibly taking medicine. Living a healthy lifestyle can help lower high blood pressure. You may need to change some of your habits. Lifestyle changes may include:  Following the DASH diet. This diet is high in fruits, vegetables, and whole grains. It is low in salt, red meat, and added sugars.  Getting at least 2 hours of brisk physical activity every week.  Losing weight if necessary.  Not smoking.  Limiting alcoholic beverages.  Learning ways to reduce stress. If lifestyle changes are not enough to get your blood pressure under control, your health care provider may prescribe medicine. You may need to take more than one. Work closely with your health care provider to understand the risks and benefits. HOME CARE INSTRUCTIONS  Have your blood pressure rechecked as directed by your health care provider.   Take medicines only as directed by your health care provider. Follow the directions carefully. Blood pressure medicines must be taken as prescribed. The medicine does not work as well when you skip doses. Skipping doses also puts you at risk for problems.   Do not smoke.   Monitor your blood pressure at home as directed by your health care provider. SEEK MEDICAL CARE IF:   You  think you are having a reaction to medicines taken.  You have recurrent headaches or feel dizzy.  You have swelling in your ankles.  You have trouble with your vision. SEEK IMMEDIATE MEDICAL CARE IF:  You develop a severe headache or confusion.  You have unusual weakness,  numbness, or feel faint.  You have severe chest or abdominal pain.  You vomit repeatedly.  You have trouble breathing. MAKE SURE YOU:   Understand these instructions.  Will watch your condition.  Will get help right away if you are not doing well or get worse. Document Released: 07/08/2005 Document Revised: 11/22/2013 Document Reviewed: 04/30/2013 [facility] Patient Information 2015 [place], [place]. This information is not intended to replace advice given to you by your health care provider. Make sure you discuss any questions you have with your health care provider.

## 2014-04-03 NOTE — ED Provider Notes (Signed)
CSN: [id number]     Arrival date & time 04/03/14  0250 History   First MD Initiated Contact with Patient 04/03/14 0308     Chief Complaint  Patient presents with  . Headache  . Shoulder Pain    bilateral  . Hypertension     (Consider location/radiation/quality/duration/timing/severity/associated sxs/prior Treatment) HPI  This is a 48 year old male with a history of hypertension, end-stage renal disease on peritoneal dialysis, diabetes who presents with headache. Patient reports he has had a headache since he began taking isosorbide several weeks ago. He has uncontrolled hypertension and was placed on this medication by his cardiologist. He was seen and evaluated last week. That time he had a CT scan and was admitted for hypertensive urgency. He left AGAINST MEDICAL ADVICE. Since that time he's been unable to contact his cardiologist. He's had continued headache which is worsened over the last 24 hours. He was seen yesterday at [facility]. At that time his blood pressure was 160/105 and he was treated for his headache and release. Patient denies any weakness, numbness, or tingling. He does report difficulty sleeping.  Past Medical History  Diagnosis Date  . Hypertension   . ESRD (end stage renal disease)     due to HTN per patient, followed at [facility], s/p failed kidney transplant  . Depression   . Complication of anesthesia     itching, sore throat  . Diabetes mellitus without complication     No history per patient, but remains under history as A1c would not be accurate given on dialysis  . Shortness of breath   . Anxiety    Past Surgical History  Procedure Laterality Date  . Kidney receipient  2006    failed and started HD in March 2014  . Capd insertion    . Capd removal     History reviewed. No pertinent family history. History  Substance Use Topics  . Smoking status: Former Smoker -- 1.00 packs/day for 1 years    Types: Cigarettes  . Smokeless tobacco: Never Used      Comment: quit Jan 2014  . Alcohol Use: No    Review of Systems  Constitutional: Negative.  Negative for fever.  Respiratory: Negative.  Negative for chest tightness and shortness of breath.   Cardiovascular: Negative.  Negative for chest pain.  Gastrointestinal: Negative.  Negative for abdominal pain.  Genitourinary: Negative.  Negative for dysuria.  Musculoskeletal: Negative for back pain and neck pain.  Skin: Negative for rash.  Neurological: Positive for headaches. Negative for dizziness, weakness, light-headedness and numbness.  Psychiatric/Behavioral: The patient is nervous/anxious.   All other systems reviewed and are negative.     Allergies  Ferrlecit and Darvocet  Home Medications   Prior to Admission medications   Medication Sig Start Date End Date Taking? Authorizing Provider  allopurinol (ZYLOPRIM) 100 MG tablet Take 100 mg by mouth daily.   Yes Historical Provider, MD  amLODipine (NORVASC) 5 MG tablet Take 10 mg by mouth daily.   Yes Historical Provider, MD  cinacalcet (SENSIPAR) 30 MG tablet Take 30 mg by mouth daily.   Yes Historical Provider, MD  cloNIDine (CATAPRES) 0.3 MG tablet Take 0.3 mg by mouth 3 (three) times daily.   Yes Historical Provider, MD  diazepam (VALIUM) 5 MG tablet Take 1 tablet (5 mg total) by mouth 2 (two) times daily. 01/25/14  Yes [name], PA-C  furosemide (LASIX) 80 MG tablet Take 80 mg by mouth daily.   Yes Historical Provider, MD  hydrALAZINE (APRESOLINE) 25 MG tablet Take 50 mg by mouth 3 (three) times daily.   Yes Historical Provider, MD  isosorbide dinitrate (ISORDIL) 40 MG tablet Take 40 mg by mouth every 8 (eight) hours.   Yes Historical Provider, MD  lisinopril (PRINIVIL,ZESTRIL) 20 MG tablet Take 20 mg by mouth daily.   Yes Historical Provider, MD  minoxidil (LONITEN) 2.5 MG tablet Take 2.5 mg by mouth daily.  10/12/13  Yes Historical Provider, MD  omeprazole (PRILOSEC) 20 MG capsule Take 20 mg by mouth daily. 07/01/13  Yes  Historical Provider, MD  predniSONE (DELTASONE) 5 MG tablet Take 15 mg by mouth daily with breakfast.   Yes Historical Provider, MD  sevelamer carbonate (RENVELA) 800 MG tablet Take 1,600 mg by mouth 3 (three) times daily with meals. 12/27/13 12/27/14 Yes Historical Provider, MD  tacrolimus (PROGRAF) 5 MG capsule Take 10 mg by mouth 2 (two) times daily.    Yes Historical Provider, MD   BP 211/154  Pulse 115  Temp(Src) 97.6 F (36.4 C) (Oral)  Resp 17  Wt 171 lb (77.565 kg)  SpO2 100% Physical Exam  Nursing note and vitals reviewed. Constitutional: He is oriented to person, place, and time. No distress.  Thin, chronically ill-appearing  HENT:  Head: Normocephalic and atraumatic.  Eyes: EOM are normal. Pupils are equal, round, and reactive to light.  Neck: Normal range of motion. Neck supple.  No meningismus noted  Cardiovascular: Normal rate, regular rhythm and normal heart sounds.   No murmur heard. Pulmonary/Chest: Effort normal and breath sounds normal. No respiratory distress. He has no wheezes.  Abdominal: Soft. Bowel sounds are normal. There is no tenderness. There is no rebound.  Musculoskeletal: He exhibits no edema.  Neurological: He is alert and oriented to person, place, and time.  Father 5 strength in all 4 extremities, fluent speech, cranial nerves II through XII intact  Skin: Skin is warm and dry.  Psychiatric: He has a normal mood and affect.    ED Course  Procedures (including critical care time)   Labs Review Labs Reviewed  CBC WITH DIFFERENTIAL - Abnormal; Notable for the following:    Hemoglobin 10.8 (*)    HCT 34.1 (*)    MCV 76.1 (*)    MCH 24.1 (*)    RDW 17.7 (*)    Monocytes Relative 13 (*)    All other components within normal limits  BASIC METABOLIC PANEL - Abnormal; Notable for the following:    Glucose, Bld 101 (*)    BUN 51 (*)    Creatinine, Ser 10.88 (*)    GFR calc non Af Amer 5 (*)    GFR calc Af Amer 6 (*)    Anion gap 18 (*)    All  other components within normal limits  TROPONIN I    Imaging Review No results found.   EKG Interpretation   Date/Time:  Sunday April 03 2014 03:19:57 EDT Ventricular Rate:  84 PR Interval:  155 QRS Duration: 83 QT Interval:  414 QTC Calculation: 489 R Axis:   11 Text Interpretation:  Sinus rhythm Left atrial enlargement Left  ventricular hypertrophy Borderline T abnormalities, lateral leads  Borderline prolonged QT interval Similar to prior Confirmed by Makaveli Hoard  MD,  [place] ([id number]) on 04/03/2014 3:41:17 AM      MDM   Final diagnoses:  Renovascular hypertension  Nonintractable headache, unspecified chronicity pattern, unspecified headache type    Patient presents with headache. He was seen and evaluated last week for  the same and admitted for hypertensive urgency. Patient relates headache to addition of isosorbide mononitrate to his medication regimen. He is notably hypertensive at 197/150. He states he's continuing to take his blood pressure medications. He was evaluated yesterday at [facility] and was discharged home. Postdialysis his blood pressure was 160/105. He is nonfocal on exam. Patient was given headache cocktail and clonidine 0.3 mg. He did have one episode of emesis. He was given hydralazine 5 mg. On recheck, patient denies any further headache. He is reporting worsening anxiety. His blood pressures continue to be elevated. I have reviewed the patient's chart and it appears that his diastolic blood pressures over his last several visits have ranged from 115 to 160. He is noted to be an uncontrolled hypertensive. CT imaging was not repeated as he is nonfocal on exam and have low suspicion for [facility] as headache has been ongoing.  Discussed with patient detrimental effects of chronically high blood pressure. Patient reports compliance with medications and is due to have dialysis overnight tonight. His blood pressures do appear better after dialysis. At this time his  headache is improved and his lab work is unremarkable. He was given Ativan for anxiety. Given that his headaches occurred with the initiation of isosorbide, feel this may be the culprit as he is been known to be very hypertensive in the past without headaches.  Discussed the patient should return precautions including headache, chest pain, shortness of breath or any other new or worsening symptoms.   He does have an appointment with the cardiologist on Monday. He will followup.  After history, exam, and medical workup I feel the patient has been appropriately medically screened and is safe for discharge home. Pertinent diagnoses were discussed with the patient. Patient was given return precautions.    Merryl Hacker, MD 04/03/14 [phone]

## 2014-04-03 NOTE — ED Notes (Addendum)
Patient c/o headache, hypertension, and bilateral shoulder pain. Patinet states he was started on a new blood pressure medication, has now had severe headache x2 days. Patient states he was told to call his PCP for pain medication. Patient states he did and has not been called back. Patient states his peritoneal dialysis Sunday, Tuesday, and Thursday. Patient dry weight 171 pounds.

## 2014-04-05 ENCOUNTER — Encounter (HOSPITAL_COMMUNITY): Payer: Self-pay | Admitting: Emergency Medicine

## 2014-04-05 ENCOUNTER — Emergency Department (HOSPITAL_COMMUNITY): Payer: Medicare Other

## 2014-04-05 ENCOUNTER — Inpatient Hospital Stay (HOSPITAL_COMMUNITY)
Admission: EM | Admit: 2014-04-05 | Discharge: 2014-04-06 | DRG: 682 | Disposition: A | Discharge status: Home / Self Care | Payer: Medicare Other | Attending: Internal Medicine | Admitting: Internal Medicine

## 2014-04-05 DIAGNOSIS — Z992 Dependence on renal dialysis: Secondary | ICD-10-CM | POA: Diagnosis not present

## 2014-04-05 DIAGNOSIS — F3289 Other specified depressive episodes: Secondary | ICD-10-CM | POA: Diagnosis present

## 2014-04-05 DIAGNOSIS — N179 Acute kidney failure, unspecified: Secondary | ICD-10-CM

## 2014-04-05 DIAGNOSIS — E875 Hyperkalemia: Secondary | ICD-10-CM

## 2014-04-05 DIAGNOSIS — N186 End stage renal disease: Secondary | ICD-10-CM | POA: Diagnosis present

## 2014-04-05 DIAGNOSIS — I120 Hypertensive chronic kidney disease with stage 5 chronic kidney disease or end stage renal disease: Secondary | ICD-10-CM | POA: Diagnosis present

## 2014-04-05 DIAGNOSIS — F329 Major depressive disorder, single episode, unspecified: Secondary | ICD-10-CM

## 2014-04-05 DIAGNOSIS — E119 Type 2 diabetes mellitus without complications: Secondary | ICD-10-CM | POA: Diagnosis present

## 2014-04-05 DIAGNOSIS — N2581 Secondary hyperparathyroidism of renal origin: Secondary | ICD-10-CM | POA: Diagnosis present

## 2014-04-05 DIAGNOSIS — Z87891 Personal history of nicotine dependence: Secondary | ICD-10-CM

## 2014-04-05 DIAGNOSIS — F32A Depression, unspecified: Secondary | ICD-10-CM

## 2014-04-05 DIAGNOSIS — E785 Hyperlipidemia, unspecified: Secondary | ICD-10-CM | POA: Diagnosis present

## 2014-04-05 DIAGNOSIS — D631 Anemia in chronic kidney disease: Secondary | ICD-10-CM | POA: Diagnosis present

## 2014-04-05 DIAGNOSIS — F411 Generalized anxiety disorder: Secondary | ICD-10-CM | POA: Diagnosis present

## 2014-04-05 DIAGNOSIS — Z9119 Patient's noncompliance with other medical treatment and regimen: Secondary | ICD-10-CM

## 2014-04-05 DIAGNOSIS — G8929 Other chronic pain: Secondary | ICD-10-CM

## 2014-04-05 DIAGNOSIS — E872 Acidosis, unspecified: Secondary | ICD-10-CM

## 2014-04-05 DIAGNOSIS — Z940 Kidney transplant status: Secondary | ICD-10-CM | POA: Diagnosis not present

## 2014-04-05 DIAGNOSIS — Z91199 Patient's noncompliance with other medical treatment and regimen due to unspecified reason: Secondary | ICD-10-CM

## 2014-04-05 DIAGNOSIS — I161 Hypertensive emergency: Secondary | ICD-10-CM

## 2014-04-05 DIAGNOSIS — N189 Chronic kidney disease, unspecified: Secondary | ICD-10-CM

## 2014-04-05 DIAGNOSIS — Z79899 Other long term (current) drug therapy: Secondary | ICD-10-CM

## 2014-04-05 DIAGNOSIS — I160 Hypertensive urgency: Secondary | ICD-10-CM

## 2014-04-05 DIAGNOSIS — R51 Headache: Secondary | ICD-10-CM | POA: Diagnosis present

## 2014-04-05 DIAGNOSIS — N039 Chronic nephritic syndrome with unspecified morphologic changes: Secondary | ICD-10-CM

## 2014-04-05 DIAGNOSIS — R0602 Shortness of breath: Secondary | ICD-10-CM

## 2014-04-05 DIAGNOSIS — I10 Essential (primary) hypertension: Secondary | ICD-10-CM

## 2014-04-05 LAB — CBC
HCT: 35.4 % — ABNORMAL LOW (ref 39.0–52.0)
HCT: 30.9 % — ABNORMAL LOW (ref 39.0–52.0)
HEMOGLOBIN: 10.9 g/dL — AB (ref 13.0–17.0)
Hemoglobin: 9.7 g/dL — ABNORMAL LOW (ref 13.0–17.0)
MCH: 24.1 pg — AB (ref 26.0–34.0)
MCH: 23.8 pg — AB (ref 26.0–34.0)
MCHC: 30.8 g/dL (ref 30.0–36.0)
MCHC: 31.4 g/dL (ref 30.0–36.0)
MCV: 78.1 fL (ref 78.0–100.0)
MCV: 75.9 fL — AB (ref 78.0–100.0)
Platelets: 292 10*3/uL (ref 150–400)
Platelets: 252 10*3/uL (ref 150–400)
RBC: 4.53 MIL/uL (ref 4.22–5.81)
RBC: 4.07 MIL/uL — AB (ref 4.22–5.81)
RDW: 17.8 % — ABNORMAL HIGH (ref 11.5–15.5)
RDW: 17.7 % — ABNORMAL HIGH (ref 11.5–15.5)
WBC: 6 10*3/uL (ref 4.0–10.5)
WBC: 5.2 10*3/uL (ref 4.0–10.5)

## 2014-04-05 LAB — BASIC METABOLIC PANEL
Anion gap: 15 (ref 5–15)
BUN: 28 mg/dL — AB (ref 6–23)
CHLORIDE: 103 meq/L (ref 96–112)
CO2: 26 mEq/L (ref 19–32)
Calcium: 9.6 mg/dL (ref 8.4–10.5)
Creatinine, Ser: 7.07 mg/dL — ABNORMAL HIGH (ref 0.50–1.35)
GFR calc Af Amer: 9 mL/min — ABNORMAL LOW (ref >90)
GFR calc non Af Amer: 8 mL/min — ABNORMAL LOW (ref >90)
GLUCOSE: 101 mg/dL — AB (ref 70–99)
Potassium: 4.3 mEq/L (ref 3.7–5.3)
Sodium: 144 mEq/L (ref 137–147)

## 2014-04-05 LAB — CREATININE, SERUM
Creatinine, Ser: 6.41 mg/dL — ABNORMAL HIGH (ref 0.50–1.35)
GFR calc non Af Amer: 9 mL/min — ABNORMAL LOW (ref >90)
GFR, EST AFRICAN AMERICAN: 11 mL/min — AB (ref >90)

## 2014-04-05 MED ORDER — SODIUM CHLORIDE 0.9 % IJ SOLN: 3.0000 mL | Freq: Two times a day (BID) | INTRAMUSCULAR | Status: DC

## 2014-04-05 MED ORDER — DARBEPOETIN ALFA-POLYSORBATE 150 MCG/0.3ML IJ SOLN
110.0000 ug | INTRAMUSCULAR | Status: DC
  Filled 2014-04-05: qty 0.3

## 2014-04-05 MED ORDER — SEVELAMER CARBONATE 800 MG PO TABS
1600.0000 mg | ORAL_TABLET | Freq: Three times a day (TID) | ORAL | Status: DC
  Filled 2014-04-05 (×6): qty 2

## 2014-04-05 MED ORDER — ONDANSETRON HCL 4 MG/2ML IJ SOLN: 4.0000 mg | Freq: Four times a day (QID) | INTRAMUSCULAR | Status: DC | PRN

## 2014-04-05 MED ORDER — FUROSEMIDE 80 MG PO TABS
80.0000 mg | ORAL_TABLET | Freq: Every day | ORAL | Status: DC
  Administered 2014-04-05 – 2014-04-06 (×2): 80 mg via ORAL
  Filled 2014-04-05: qty 1
  Filled 2014-04-05: qty 2

## 2014-04-05 MED ORDER — MINOXIDIL 2.5 MG PO TABS
2.5000 mg | ORAL_TABLET | Freq: Every day | ORAL | Status: DC
  Administered 2014-04-06 (×2): 2.5 mg via ORAL
  Filled 2014-04-05 (×2): qty 1

## 2014-04-05 MED ORDER — PANTOPRAZOLE SODIUM 40 MG PO TBEC
40.0000 mg | DELAYED_RELEASE_TABLET | Freq: Every day | ORAL | Status: DC
  Administered 2014-04-06 (×2): 40 mg via ORAL
  Filled 2014-04-05 (×2): qty 1

## 2014-04-05 MED ORDER — PROMETHAZINE HCL 25 MG/ML IJ SOLN
12.5000 mg | Freq: Once | INTRAMUSCULAR | Status: AC
  Administered 2014-04-05: 12.5 mg via INTRAVENOUS
  Filled 2014-04-05: qty 1

## 2014-04-05 MED ORDER — CINACALCET HCL 30 MG PO TABS
30.0000 mg | ORAL_TABLET | Freq: Every day | ORAL | Status: DC
  Administered 2014-04-06: 30 mg via ORAL
  Filled 2014-04-05 (×2): qty 1

## 2014-04-05 MED ORDER — NIFEDIPINE ER 30 MG PO TB24
30.0000 mg | ORAL_TABLET | Freq: Every day | ORAL | Status: DC
  Administered 2014-04-05 – 2014-04-06 (×2): 30 mg via ORAL
  Filled 2014-04-05 (×2): qty 1

## 2014-04-05 MED ORDER — HYDRALAZINE HCL 50 MG PO TABS
50.0000 mg | ORAL_TABLET | Freq: Three times a day (TID) | ORAL | Status: DC
  Administered 2014-04-05 – 2014-04-06 (×4): 50 mg via ORAL
  Filled 2014-04-05 (×6): qty 1

## 2014-04-05 MED ORDER — ALLOPURINOL 100 MG PO TABS
100.0000 mg | ORAL_TABLET | Freq: Every day | ORAL | Status: DC
  Administered 2014-04-06 (×2): 100 mg via ORAL
  Filled 2014-04-05 (×2): qty 1

## 2014-04-05 MED ORDER — LISINOPRIL 20 MG PO TABS
20.0000 mg | ORAL_TABLET | Freq: Every day | ORAL | Status: DC
  Administered 2014-04-05 – 2014-04-06 (×2): 20 mg via ORAL
  Filled 2014-04-05 (×2): qty 1

## 2014-04-05 MED ORDER — PREDNISONE 5 MG PO TABS
15.0000 mg | ORAL_TABLET | Freq: Every day | ORAL | Status: DC
  Administered 2014-04-06: 15 mg via ORAL
  Filled 2014-04-05 (×2): qty 1

## 2014-04-05 MED ORDER — SODIUM CHLORIDE 0.9 % IV SOLN: 250.0000 mL | INTRAVENOUS | Status: DC | PRN

## 2014-04-05 MED ORDER — HYDROMORPHONE HCL PF 1 MG/ML IJ SOLN
0.5000 mg | Freq: Once | INTRAMUSCULAR | Status: AC
  Administered 2014-04-05: 0.5 mg via INTRAVENOUS
  Filled 2014-04-05: qty 1

## 2014-04-05 MED ORDER — DOXERCALCIFEROL 4 MCG/2ML IV SOLN
INTRAVENOUS | Status: AC
  Administered 2014-04-05: 6 ug via INTRAVENOUS
  Filled 2014-04-05: qty 2

## 2014-04-05 MED ORDER — DEXAMETHASONE 4 MG PO TABS
4.0000 mg | ORAL_TABLET | Freq: Four times a day (QID) | ORAL | Status: DC | PRN
  Filled 2014-04-05: qty 1

## 2014-04-05 MED ORDER — CLONIDINE HCL 0.3 MG PO TABS
0.3000 mg | ORAL_TABLET | Freq: Three times a day (TID) | ORAL | Status: DC
  Administered 2014-04-05 – 2014-04-06 (×4): 0.3 mg via ORAL
  Filled 2014-04-05: qty 1
  Filled 2014-04-05: qty 3
  Filled 2014-04-05: qty 1
  Filled 2014-04-05: qty 3
  Filled 2014-04-05: qty 1
  Filled 2014-04-05: qty 3
  Filled 2014-04-05: qty 1
  Filled 2014-04-05: qty 3

## 2014-04-05 MED ORDER — DOXERCALCIFEROL 4 MCG/2ML IV SOLN
6.0000 ug | INTRAVENOUS | Status: DC
  Administered 2014-04-05: 6 ug via INTRAVENOUS
  Filled 2014-04-05: qty 4

## 2014-04-05 MED ORDER — ISOSORBIDE DINITRATE 20 MG PO TABS
40.0000 mg | ORAL_TABLET | Freq: Three times a day (TID) | ORAL | Status: DC
  Administered 2014-04-05 – 2014-04-06 (×4): 40 mg via ORAL
  Filled 2014-04-05 (×8): qty 2

## 2014-04-05 MED ORDER — TACROLIMUS 1 MG PO CAPS
10.0000 mg | ORAL_CAPSULE | Freq: Two times a day (BID) | ORAL | Status: DC
  Administered 2014-04-06 (×2): 10 mg via ORAL
  Filled 2014-04-05 (×3): qty 10

## 2014-04-05 MED ORDER — ONDANSETRON HCL 4 MG PO TABS: 4.0000 mg | ORAL_TABLET | Freq: Four times a day (QID) | ORAL | Status: DC | PRN

## 2014-04-05 MED ORDER — HEPARIN SODIUM (PORCINE) 5000 UNIT/ML IJ SOLN
5000.0000 [IU] | Freq: Three times a day (TID) | INTRAMUSCULAR | Status: DC
  Administered 2014-04-06 (×2): 5000 [IU] via SUBCUTANEOUS
  Filled 2014-04-05 (×3): qty 1

## 2014-04-05 MED ORDER — DEXAMETHASONE SODIUM PHOSPHATE 4 MG/ML IJ SOLN
4.0000 mg | Freq: Four times a day (QID) | INTRAMUSCULAR | Status: DC | PRN
  Filled 2014-04-05: qty 1

## 2014-04-05 MED ORDER — DARBEPOETIN ALFA-POLYSORBATE 150 MCG/0.3ML IJ SOLN
INTRAMUSCULAR | Status: AC
  Administered 2014-04-05: 110 ug via INTRAVENOUS
  Filled 2014-04-05: qty 0.3

## 2014-04-05 MED ORDER — DARBEPOETIN ALFA-POLYSORBATE 150 MCG/0.3ML IJ SOLN
110.0000 ug | INTRAMUSCULAR | Status: DC
  Administered 2014-04-05: 110 ug via INTRAVENOUS
  Filled 2014-04-05: qty 0.3

## 2014-04-05 MED ORDER — HYDRALAZINE HCL 20 MG/ML IJ SOLN: 20.0000 mg | Freq: Once | INTRAMUSCULAR | Status: DC

## 2014-04-05 MED ORDER — HYDRALAZINE HCL 20 MG/ML IJ SOLN
5.0000 mg | Freq: Once | INTRAMUSCULAR | Status: AC
  Administered 2014-04-05: 5 mg via INTRAVENOUS
  Filled 2014-04-05: qty 1

## 2014-04-05 MED ORDER — DIAZEPAM 5 MG PO TABS
5.0000 mg | ORAL_TABLET | Freq: Two times a day (BID) | ORAL | Status: DC
  Administered 2014-04-06 (×2): 5 mg via ORAL
  Filled 2014-04-05 (×2): qty 1

## 2014-04-05 MED ORDER — DOXERCALCIFEROL 4 MCG/2ML IV SOLN
6.0000 ug | INTRAVENOUS | Status: DC
  Filled 2014-04-05: qty 4

## 2014-04-05 MED ORDER — AMLODIPINE BESYLATE 10 MG PO TABS
10.0000 mg | ORAL_TABLET | Freq: Every day | ORAL | Status: DC
  Administered 2014-04-05: 10 mg via ORAL
  Filled 2014-04-05: qty 1

## 2014-04-05 MED ORDER — SODIUM CHLORIDE 0.9 % IJ SOLN: 3.0000 mL | INTRAMUSCULAR | Status: DC | PRN

## 2014-04-05 NOTE — H&P (Signed)
PCP:   Marcelle Overlie [name]   Chief Complaint:  Headache  HPI:  48 year old male who has a history of hypertension, hyperlipidemia, ESRD, status post failed renal transplant 8 years ago, on hemodialysis Tuesday Thursday and Sunday for last 5 years followed by Dr. Donnetta Simpers at [place] , [place] who came to the ED with chief complaint of headaches and was found to have hypertensive urgency. Patient was admitted with similar complaints on 03/27/2014 and signed out AMA from [place] on 03/28/2014.  He denies chest pain, no shortness of breath. He admits to having some nausea but no vomiting. Denies abdominal pain.  Allergies:   Allergies  Allergen Reactions  . Ferrlecit [Na Ferric Gluc Cplx In Sucrose] Shortness Of Breath, Swelling and Other (See Comments)    Swelling in throat  . Darvocet [Propoxyphene N-Acetaminophen] Hives      Past Medical History  Diagnosis Date  . Hypertension   . ESRD (end stage renal disease)     due to HTN per patient, followed at [facility], s/p failed kidney transplant  . Depression   . Complication of anesthesia     itching, sore throat  . Diabetes mellitus without complication     No history per patient, but remains under history as A1c would not be accurate given on dialysis  . Shortness of breath   . Anxiety     Past Surgical History  Procedure Laterality Date  . Kidney receipient  2006    failed and started HD in March 2014  . Capd insertion    . Capd removal      Prior to Admission medications   Medication Sig Start Date End Date Taking? Authorizing Provider  allopurinol (ZYLOPRIM) 100 MG tablet Take 100 mg by mouth daily.   Yes Historical Provider, MD  amLODipine (NORVASC) 5 MG tablet Take 10 mg by mouth daily.   Yes Historical Provider, MD  cinacalcet (SENSIPAR) 30 MG tablet Take 30 mg by mouth daily.   Yes Historical Provider, MD  cloNIDine (CATAPRES) 0.3 MG tablet Take 0.3 mg by mouth 3 (three) times daily.   Yes Historical  Provider, MD  diazepam (VALIUM) 5 MG tablet Take 1 tablet (5 mg total) by mouth 2 (two) times daily. 01/25/14  Yes [name], PA-C  furosemide (LASIX) 80 MG tablet Take 80 mg by mouth daily.   Yes Historical Provider, MD  hydrALAZINE (APRESOLINE) 25 MG tablet Take 50 mg by mouth 3 (three) times daily.   Yes Historical Provider, MD  isosorbide dinitrate (ISORDIL) 40 MG tablet Take 40 mg by mouth every 8 (eight) hours.   Yes Historical Provider, MD  lisinopril (PRINIVIL,ZESTRIL) 20 MG tablet Take 20 mg by mouth daily.   Yes Historical Provider, MD  minoxidil (LONITEN) 2.5 MG tablet Take 2.5 mg by mouth daily.  10/12/13  Yes Historical Provider, MD  omeprazole (PRILOSEC) 20 MG capsule Take 20 mg by mouth daily. 07/01/13  Yes Historical Provider, MD  predniSONE (DELTASONE) 5 MG tablet Take 15 mg by mouth daily with breakfast.   Yes Historical Provider, MD  sevelamer carbonate (RENVELA) 800 MG tablet Take 1,600 mg by mouth 3 (three) times daily with meals. 12/27/13 12/27/14 Yes Historical Provider, MD  tacrolimus (PROGRAF) 5 MG capsule Take 10 mg by mouth 2 (two) times daily.    Yes Historical Provider, MD    Social History:  reports that he has quit smoking. His smoking use included Cigarettes. He has a 1 pack-year smoking history. He has never  used smokeless tobacco. He reports that he does not drink alcohol or use illicit drugs.  History reviewed. No pertinent family history.   All the positives are listed in BOLD  Review of Systems:  HEENT: Headache, blurred vision, runny nose, sore throat Neck: Hypothyroidism, hyperthyroidism,,lymphadenopathy Chest : Shortness of breath, history of COPD, Asthma Heart : Chest pain, history of coronary arterey disease GI:  Nausea, vomiting, diarrhea, constipation, GERD GU: Dysuria, urgency, frequency of urination, hematuria Neuro: Stroke, seizures, syncope Psych: Depression, anxiety, hallucinations   Physical Exam: Blood pressure 169/117, pulse 93,  temperature 97.8 F (36.6 C), temperature source Oral, resp. rate 19, height _0  (1.88 m), weight 79.379 kg (175 lb), SpO2 99.00%. Constitutional:   Patient is a well-developed and well-nourished male* in no acute distress and cooperative with exam. Head: Normocephalic and atraumatic Mouth: Mucus membranes moist Eyes: PERRL, EOMI, conjunctivae normal Neck: Supple, No Thyromegaly Cardiovascular: RRR, S1 normal, S2 normal Pulmonary/Chest: CTAB, no wheezes, rales, or rhonchi Abdominal: Soft. Non-tender, non-distended, bowel sounds are normal, no masses, organomegaly, or guarding present.  Neurological: A&O x3, Strenght is normal and symmetric bilaterally, cranial nerve II-XII are grossly intact, no focal motor deficit, sensory intact to light touch bilaterally.  Extremities : No Cyanosis, Clubbing or Edema  Labs on Admission:  Basic Metabolic Panel:  Recent Labs Lab 04/03/14 0400 04/05/14 0807  NA 139 144  K 4.4 4.3  CL 99 103  CO2 22 26  GLUCOSE 101* 101*  BUN 51* 28*  CREATININE 10.88* 7.07*  CALCIUM 9.4 9.6   CBC:  Recent Labs Lab 04/03/14 0400 04/05/14 0807  WBC 6.4 6.0  NEUTROABS 3.9  --   HGB 10.8* 10.9*  HCT 34.1* 35.4*  MCV 76.1* 78.1  PLT 265 292   Cardiac Enzymes:  Recent Labs Lab 04/03/14 0400  TROPONINI <0.30    BNP (last 3 results)  Recent Labs  12/12/13 1641 01/02/14 0317 01/18/14 0048  PROBNP >70000.0* >70000.0* >70000.0*   CBG: No results found for this basename: GLUCAP,  in the last 168 hours  Radiological Exams on Admission: Dg Chest 2 View  04/05/2014   CLINICAL DATA:  Hypertension with headache  EXAM: CHEST  2 VIEW  COMPARISON:  January 24, 2014  FINDINGS: There is no edema or consolidation. Heart is enlarged but stable. Pulmonary vascularity is normal. Central catheter tip is in the right atrium. No appreciable adenopathy. No pneumothorax. No bone lesions.  IMPRESSION: Cardiomegaly.  No edema or consolidation.   Electronically Signed    By: Lowella Grip M.D.   On: 04/05/2014 09:00   Ct Head Wo Contrast  04/05/2014   CLINICAL DATA:  Intermittent headache for 2 days; history of chronic dialysis dependent renal failure ; history of hypertension and vomiting  EXAM: CT HEAD WITHOUT CONTRAST  TECHNIQUE: Contiguous axial images were obtained from the base of the skull through the vertex without intravenous contrast.  COMPARISON:  Noncontrast CT scan of brain of March 27, 2014  FINDINGS: The ventricles are normal in size and position. There is no intracranial hemorrhage nor intracranial mass effect. No acute ischemic changes are demonstrated. The cerebellum and brainstem are normal.  There is mucoperiosteal thickening within both maxillary sinuses. Minimal involvement of the ethmoid and right sphenoid sinus cells is demonstrated. There is no skull fracture nor lytic or blastic calvarial lesion.  IMPRESSION: 1. There is no acute intracranial hemorrhage nor other acute abnormality of the brain. 2. Inflammatory changes of the paranasal sinuses are demonstrated and are little  changed since the previous study. These findings could be related to the patient's symptoms.   Electronically Signed   By: David  Martinique   On: 04/05/2014 08:45    EKG: Independently reviewed. Prolonged QT interval, with QTC 525   Assessment/Plan Principal Problem:   Hypertensive urgency Active Problems:   ESRD on dialysis- Tues, Thurs, Sat in [employer], nephro: [name]   Anemia of chronic kidney failure  Hypertensive urgency Patient is on multiple medications at home, I'm not sure whether he is compliant with his medications though he admits to taking his medications. Will restart all the home meds including Catapres 0.3 3 times a day, hydralazine 50 mg 3 times a day, isosorbide 40 mg every 8 hours, lisinopril 20 mg by mouth daily, minoxidil 2.5 mg daily. I will discontinue amlodipine and start nifedipine XL 30 mg by mouth daily.  End-stage renal disease on  dialysis Nephrology was called and I discussed with Dr. Arty Baumgartner, who will see the patient when he arrives at [place]. Patient will require hemodialysis, he gets dialysis on Tuesday Thursday and Sunday.  Status post renal transplant Currently patient is on immunosuppressants, we'll continue Prograf , prednisone.  DVT prophylaxis Heparin  Code status: Patient is full code  Family discussion: No family at bedside   Time Spent on Admission: 27 minutes  [facility] Pager: [phone] 04/05/2014, 12:24 PM  If 7PM-7AM, please contact night-coverage  [email or web address]  Password [email or web address]

## 2014-04-05 NOTE — ED Notes (Signed)
Attempted to call report to [phone], RN to call back

## 2014-04-05 NOTE — ED Notes (Signed)
Patient is still unable to urinate

## 2014-04-05 NOTE — ED Notes (Signed)
Pt insisted on ambulating to the bathroom. Refused urinal. Was informed his b/p was high and he should not be walking around. Pt continued to refuse urinal and stated he has kidney trouble and needs to sit on the toilet.

## 2014-04-05 NOTE — ED Provider Notes (Signed)
I saw and evaluated the patient, reviewed the resident's note and I agree with the findings and plan.   EKG Interpretation   Date/Time:  Tuesday April 05 2014 04:11:37 EDT Ventricular Rate:  98 PR Interval:  164 QRS Duration: 85 QT Interval:  411 QTC Calculation: 525 R Axis:   -30 Text Interpretation:  Sinus rhythm Probable left atrial enlargement Left  ventricular hypertrophy Nonspecific T abnormalities, lateral leads  Prolonged QT interval No significant change since last tracing Confirmed  by Lindi Abram  MD, Dandra Shambaugh ([id number]) on 04/05/2014 8:22:19 AM      Patient here complaining of headache similar to when he sat for the last 2 days has been associated with hypertension. States he's been compliant with his medications. Has been admitted for hypertensive crisis in the past. He is a dialysis patient currently. On physical exam here he has no focal neurological deficits. Plan is to give the patient his further medications as well as a dose of hydralazine. We'll check head CT.  Leota Jacobsen, MD 04/05/14 [phone]

## 2014-04-05 NOTE — Progress Notes (Signed)
Patient arrived from [place] at approx 1830. A&OX4, no distress noted. Patient eating supper and immediately going to dialysis. HD RN to monitor telemetry in HD unit. Patient to be placed on telemetry when he returns to [place].   Joellen Jersey, RN.

## 2014-04-05 NOTE — ED Notes (Signed)
Patient will be unable to give urine drug screen due to, him being on dialysis.

## 2014-04-05 NOTE — ED Notes (Signed)
Patient transported to CT

## 2014-04-05 NOTE — ED Provider Notes (Signed)
I saw and evaluated the patient, reviewed the resident's note and I agree with the findings and plan.   EKG Interpretation   Date/Time:  Tuesday April 05 2014 04:11:37 EDT Ventricular Rate:  98 PR Interval:  164 QRS Duration: 85 QT Interval:  411 QTC Calculation: 525 R Axis:   -30 Text Interpretation:  Sinus rhythm Probable left atrial enlargement Left  ventricular hypertrophy Nonspecific T abnormalities, lateral leads  Prolonged QT interval No significant change since last tracing Confirmed  by Zenia Resides  MD, Manahil Vanzile ([id number]) on 04/05/2014 8:22:19 AM       Leota Jacobsen, MD 04/05/14 1622

## 2014-04-05 NOTE — Consult Note (Signed)
Requesting Physician:  Dirk Dress EDP Reason for Consult:  Provision of HD and ESRD related care HPI: The patient is a 48 y.o. year-old AAM with ESRD, failed renal transplant - followed by the nephrologists at [facility], and currently on Tues, Thurs, Sunday dialysis in their nocturnal program.  He is now is transferred from [facility] with what is felt to be hypertensive urgency after presenting there with headaches.  He received his last HD on Sunday night. We are asked to provide HD during hospitalization.   Of note he was here at [facility] 9/6 for same issue, signed out AMA on 9/7.  Per his HD unit, generally does OK with HD, misses treatments sometimes, gained about 7 lb from his last thurs-sun tmt, Has declined to have permanent access placed. Remains on medicines for his transplant despite inadequate function to sustain life without HD  Past Medical History  Diagnosis Date  . Hypertension   . ESRD (end stage renal disease)     due to HTN per patient, followed at [facility], s/p failed kidney transplant  . Depression   . Complication of anesthesia     itching, sore throat  . Diabetes mellitus without complication     No history per patient, but remains under history as A1c would not be accurate given on dialysis  . Shortness of breath   . Anxiety      Past Surgical History  Procedure Laterality Date  . Kidney receipient  2006    failed and started HD in March 2014  . Capd insertion    . Capd removal      Family History: History reviewed. No pertinent family history. Social History:  reports that he has quit smoking. His smoking use included Cigarettes. He has a 1 pack-year smoking history. He has never used smokeless tobacco. He reports that he does not drink alcohol or use illicit drugs.  Allergies  Allergen Reactions  . Ferrlecit [Na Ferric Gluc Cplx In Sucrose] Shortness Of Breath, Swelling and Other (See Comments)    Swelling in throat  . Darvocet [Propoxyphene N-Acetaminophen] Hives    Prior  to Admission medications   Medication Sig Start Date End Date Taking? Authorizing Provider  allopurinol (ZYLOPRIM) 100 MG tablet Take 100 mg by mouth daily.   Yes Historical Provider, MD  amLODipine (NORVASC) 5 MG tablet Take 10 mg by mouth daily.   Yes Historical Provider, MD  cinacalcet (SENSIPAR) 30 MG tablet Take 30 mg by mouth daily.   Yes Historical Provider, MD  cloNIDine (CATAPRES) 0.3 MG tablet Take 0.3 mg by mouth 3 (three) times daily.   Yes Historical Provider, MD  diazepam (VALIUM) 5 MG tablet Take 1 tablet (5 mg total) by mouth 2 (two) times daily. 01/25/14  Yes [name], PA-C  furosemide (LASIX) 80 MG tablet Take 80 mg by mouth daily.   Yes Historical Provider, MD  hydrALAZINE (APRESOLINE) 25 MG tablet Take 50 mg by mouth 3 (three) times daily.   Yes Historical Provider, MD  isosorbide dinitrate (ISORDIL) 40 MG tablet Take 40 mg by mouth every 8 (eight) hours.   Yes Historical Provider, MD  lisinopril (PRINIVIL,ZESTRIL) 20 MG tablet Take 20 mg by mouth daily.   Yes Historical Provider, MD  minoxidil (LONITEN) 2.5 MG tablet Take 2.5 mg by mouth daily.  10/12/13  Yes Historical Provider, MD  omeprazole (PRILOSEC) 20 MG capsule Take 20 mg by mouth daily. 07/01/13  Yes Historical Provider, MD  predniSONE (DELTASONE) 5 MG tablet Take 15 mg  by mouth daily with breakfast.   Yes Historical Provider, MD  sevelamer carbonate (RENVELA) 800 MG tablet Take 1,600 mg by mouth 3 (three) times daily with meals. 12/27/13 12/27/14 Yes Historical Provider, MD  tacrolimus (PROGRAF) 5 MG capsule Take 10 mg by mouth 2 (two) times daily.    Yes Historical Provider, MD    Inpatient medications: . allopurinol  100 mg Oral Daily  . cinacalcet  30 mg Oral Q breakfast  . cloNIDine  0.3 mg Oral TID  . darbepoetin (ARANESP) injection - DIALYSIS  110 mcg Intravenous Q Tue-HD  . diazepam  5 mg Oral BID  . doxercalciferol  6 mcg Intravenous Q T,Th,Sa-HD  . furosemide  80 mg Oral Daily  . heparin  5,000 Units  Subcutaneous 3 times per day  . hydrALAZINE  50 mg Oral TID  . isosorbide dinitrate  40 mg Oral 3 times per day  . lisinopril  20 mg Oral Daily  . minoxidil  2.5 mg Oral Daily  . NIFEdipine  30 mg Oral Daily  . pantoprazole  40 mg Oral Daily  . predniSONE  15 mg Oral Q breakfast  . sevelamer carbonate  1,600 mg Oral TID WC  . sodium chloride  3 mL Intravenous Q12H  . tacrolimus  10 mg Oral BID    Review of Systems As per HPI   Physical Exam:  Blood pressure 171/131, pulse 92, temperature 97.8 F (36.6 C), temperature source Oral, resp. rate 18, height _0  (1.88 m), weight 79.379 kg (175 lb), SpO2 99.00%.  Gen: Well app BM NAD Skin: no rash, cyanosis Neck: no JVD, no bruits or LAN Chest: Clear Heart: regular, no rub or gallop Abdomen: soft, no tenderness. Old onfx txp palpable Ext: No edema Neuro: alert, Ox3, no focal deficit Heme/Lymph: no bruising or LAN R IJ [facility]   Labs: Basic Metabolic Panel:  Recent Labs Lab 04/03/14 0400 04/05/14 0807  NA 139 144  K 4.4 4.3  CL 99 103  CO2 22 26  GLUCOSE 101* 101*  BUN 51* 28*  CREATININE 10.88* 7.07*  CALCIUM 9.4 9.6   Recent Labs Lab 04/03/14 0400 04/05/14 0807  WBC 6.4 6.0  NEUTROABS 3.9  --   HGB 10.8* 10.9*  HCT 34.1* 35.4*  MCV 76.1* 78.1  PLT 265 292   Recent Labs Lab 04/03/14 0400  TROPONINI <0.30   Xrays/Other Studies: Dg Chest 2 View  04/05/2014   CLINICAL DATA:  Hypertension with headache  EXAM: CHEST  2 VIEW  COMPARISON:  January 24, 2014  FINDINGS: There is no edema or consolidation. Heart is enlarged but stable. Pulmonary vascularity is normal. Central catheter tip is in the right atrium. No appreciable adenopathy. No pneumothorax. No bone lesions.  IMPRESSION: Cardiomegaly.  No edema or consolidation.   Electronically Signed   By: Lowella Grip M.D.   On: 04/05/2014 09:00   Ct Head Wo Contrast  04/05/2014   CLINICAL DATA:  Intermittent headache for 2 days; history of chronic dialysis dependent  renal failure ; history of hypertension and vomiting  EXAM: CT HEAD WITHOUT CONTRAST  TECHNIQUE: Contiguous axial images were obtained from the base of the skull through the vertex without intravenous contrast.  COMPARISON:  Noncontrast CT scan of brain of March 27, 2014  FINDINGS: The ventricles are normal in size and position. There is no intracranial hemorrhage nor intracranial mass effect. No acute ischemic changes are demonstrated. The cerebellum and brainstem are normal.  There is mucoperiosteal thickening within  both maxillary sinuses. Minimal involvement of the ethmoid and right sphenoid sinus cells is demonstrated. There is no skull fracture nor lytic or blastic calvarial lesion.  IMPRESSION: 1. There is no acute intracranial hemorrhage nor other acute abnormality of the brain. 2. Inflammatory changes of the paranasal sinuses are demonstrated and are little changed since the previous study. These findings could be related to the patient's symptoms.   Electronically Signed   By: David  Martinique   On: 04/05/2014 08:45   NOCTURNAL DIALYSIS PRESCRIPTION 8 hours  Sun [place] Thurs 350/700 2K 2.5 Ca 5700 heparin/700 per hour R IJ TDC Hectorol 6 mcg TIW Aranesp 110 mg QTues HD EDW 165 lb    (Former TTSat RX was 4 hours, same bath, 400/800, 3400 heparin and 500/hour)  Impression/Plan 1. Hypertension with hypertensive urgency - per admitting service 2. ESRD - on TTHSUN in the nocturnal program at [facility] - will need to do conventional HD here in the hospital so will revert to his old prescription from [facility]TS conventional  3. Failed renal transplant - still on pred and tac; still makes some urine 4. IJ [facility] dialysis catheter and has refused permanent access per his outpt unit 5. Anemia - continue usual Aranesp 11- QTues 6. Secondary hyperpara - continue usual hectorol/sensipar.   Jamal Maes,  MD [facility] [phone] pager 04/05/2014, 4:14 PM

## 2014-04-05 NOTE — ED Notes (Signed)
Pt states he was seen here on Sunday for same  Pt states he had dialysis on Sunday  Pt states headache came back on Monday and pt stated having vomiting  Pt states his blood pressure has been elevated

## 2014-04-05 NOTE — ED Notes (Signed)
Pt eating breakfast 

## 2014-04-05 NOTE — Progress Notes (Signed)
Full consult note to follow. Patient with ESRD, followed by [facility] nephrologists, on their nocturnal dialysis program (Tues-Thurs-Sun) Have been waiting for patient transfer from [facility].but at of this time [facility] has not been available to transport him. He will have to do conventional HD while here at [facility] Orders entered for dialysis, usual aranesp and hectorol.  Jamal Maes, MD [facility] [phone] Pager 04/05/2014, 4:40 PM

## 2014-04-05 NOTE — Progress Notes (Signed)
  CARE MANAGEMENT ED NOTE 04/05/2014  Patient:  Frank [name], Frank [name]   Account Number:  [id number]  Date Initiated:  04/05/2014  Documentation initiated by:  Jackelyn Poling  Subjective/Objective Assessment:   48 yr old male [place] dialysis pt dx with HTn urgency at [facility] ED to transfer to [facility]. Pt with 18 ED visits and 5 admissions in last 6 months for HTN/HA,  c/o headache x3 days associated vomiting.  s/p kidney transplant 2006     Subjective/Objective Assessment Detail:   Pt with observation admission for HTN urgency on 03/27/14 Pt left AMA  Pt confirms with CM he did not f/u with pcp after 03/27/14 (9 days ago) but states he saw his nephrologist on "Sunday" Reports not calls from pcp office  "Oh Oh that sounds like trouble" Pt noted to make a request of his ED RN to go to his car During this request pt stated "I got lots of things going on in my life to be held hostage here" Pt denied the need when ED RN & security staff offer assist  PCP Marcelle Overlie     Action/Plan:   ED CM spoke with pt about re admission status and 03/27/14 d/c f/u care   Action/Plan Detail:   Anticipated DC Date:  04/08/2014     Status Recommendation to Physician:   Result of Recommendation:    Other ED [place]  Other  Outpatient Services - Pt will follow up    Choice offered to / List presented to:            Status of service:  Completed, signed off  ED Comments:   ED Comments Detail:

## 2014-04-05 NOTE — ED Notes (Signed)
Told pt need urine specimen, states just voided and does not have to at this time.

## 2014-04-05 NOTE — ED Provider Notes (Signed)
CSN: [id number]     Arrival date & time 04/05/14  0310 History   First MD Initiated Contact with Patient 04/05/14 [phone]     Chief Complaint  Patient presents with  . Emesis  . Headache  . Hypertension     (Consider location/radiation/quality/duration/timing/severity/associated sxs/prior Treatment) HPI Comments: Patient presents with headache x3 days associated vomiting.  He is status post kidney transplant 2006 with multiple ED visits recently for hypertensive emergency and headache.  He reports this is the same headache for the last 3 visits, which he associates with initiation of isosorbide.  He reports compliance with most of his BP medications, but does admit to running out of clonidine yesterday.  He receives peritoneal dialysis Sunday, Tuesday, and Thursday.  He also endorses OSA has been off CPAP machine for the last week. His headache and neck pain are worse with coughing. Denis alcohol, drugs , or tobacco use.      Patient is a 48 y.o. male presenting with vomiting, headaches, and hypertension. The history is provided by the patient.  Emesis Severity:  Mild Duration:  1 day Timing:  Intermittent Quality:  Stomach contents Able to tolerate:  Liquids Chronicity:  New Recent urination:  Normal Relieved by:  Nothing Ineffective treatments:  None tried Associated symptoms: headaches   Associated symptoms: no abdominal pain, no diarrhea, no fever and no URI   Risk factors: no diabetes and no sick contacts   Headache Pain location:  Occipital Severity currently:  8/10 Severity at highest:  5/10 Onset quality:  Gradual Duration:  3 days Timing:  Constant Progression:  Improving Chronicity:  Recurrent Context: coughing   Context: not activity, not exposure to bright light and not loud noise   Relieved by:  Nothing Worsened by:  Nothing tried Ineffective treatments:  None tried Associated symptoms: blurred vision, congestion, cough, nausea, neck pain, neck stiffness and  vomiting   Associated symptoms: no abdominal pain, no back pain, no diarrhea, no dizziness, no pain, no fever, no focal weakness, no loss of balance, no numbness, no photophobia and no URI   Hypertension Associated symptoms include congestion, coughing, headaches, nausea, neck pain and vomiting. Pertinent negatives include no abdominal pain, fever or numbness.    Past Medical History  Diagnosis Date  . Hypertension   . ESRD (end stage renal disease)     due to HTN per patient, followed at [facility], s/p failed kidney transplant  . Depression   . Complication of anesthesia     itching, sore throat  . Diabetes mellitus without complication     No history per patient, but remains under history as A1c would not be accurate given on dialysis  . Shortness of breath   . Anxiety    Past Surgical History  Procedure Laterality Date  . Kidney receipient  2006    failed and started HD in March 2014  . Capd insertion    . Capd removal     History reviewed. No pertinent family history. History  Substance Use Topics  . Smoking status: Former Smoker -- 1.00 packs/day for 1 years    Types: Cigarettes  . Smokeless tobacco: Never Used     Comment: quit Jan 2014  . Alcohol Use: No    Review of Systems  Constitutional: Negative for fever.  HENT: Positive for congestion.   Eyes: Positive for blurred vision. Negative for photophobia and pain.  Respiratory: Positive for cough.   Gastrointestinal: Positive for nausea and vomiting. Negative for abdominal pain and  diarrhea.  Musculoskeletal: Positive for neck pain and neck stiffness. Negative for back pain.  Neurological: Positive for headaches. Negative for dizziness, focal weakness, numbness and loss of balance.      Allergies  Ferrlecit and Darvocet  Home Medications   Prior to Admission medications   Medication Sig Start Date End Date Taking? Authorizing Provider  allopurinol (ZYLOPRIM) 100 MG tablet Take 100 mg by mouth daily.   Yes  Historical Provider, MD  amLODipine (NORVASC) 5 MG tablet Take 10 mg by mouth daily.   Yes Historical Provider, MD  cinacalcet (SENSIPAR) 30 MG tablet Take 30 mg by mouth daily.   Yes Historical Provider, MD  cloNIDine (CATAPRES) 0.3 MG tablet Take 0.3 mg by mouth 3 (three) times daily.   Yes Historical Provider, MD  diazepam (VALIUM) 5 MG tablet Take 1 tablet (5 mg total) by mouth 2 (two) times daily. 01/25/14  Yes [name], PA-C  furosemide (LASIX) 80 MG tablet Take 80 mg by mouth daily.   Yes Historical Provider, MD  hydrALAZINE (APRESOLINE) 25 MG tablet Take 50 mg by mouth 3 (three) times daily.   Yes Historical Provider, MD  isosorbide dinitrate (ISORDIL) 40 MG tablet Take 40 mg by mouth every 8 (eight) hours.   Yes Historical Provider, MD  lisinopril (PRINIVIL,ZESTRIL) 20 MG tablet Take 20 mg by mouth daily.   Yes Historical Provider, MD  minoxidil (LONITEN) 2.5 MG tablet Take 2.5 mg by mouth daily.  10/12/13  Yes Historical Provider, MD  omeprazole (PRILOSEC) 20 MG capsule Take 20 mg by mouth daily. 07/01/13  Yes Historical Provider, MD  predniSONE (DELTASONE) 5 MG tablet Take 15 mg by mouth daily with breakfast.   Yes Historical Provider, MD  sevelamer carbonate (RENVELA) 800 MG tablet Take 1,600 mg by mouth 3 (three) times daily with meals. 12/27/13 12/27/14 Yes Historical Provider, MD  tacrolimus (PROGRAF) 5 MG capsule Take 10 mg by mouth 2 (two) times daily.    Yes Historical Provider, MD   BP 184/121  Pulse 126  Temp(Src) 97.8 F (36.6 C) (Oral)  Resp 14  Ht _0  (1.88 m)  Wt 175 lb (79.379 kg)  BMI 22.46 kg/m2  SpO2 97% Physical Exam  Constitutional: He is oriented to person, place, and time. He appears well-developed. No distress.  HENT:  Mouth/Throat: Oropharynx is clear and moist.  Eyes: EOM are normal. Pupils are equal, round, and reactive to light.  Neck: Normal range of motion. Neck supple. No JVD present.  Cardiovascular: Normal rate, regular rhythm and normal heart  sounds.   No LE edema. PD cath site: Clean w/o erythema  Pulmonary/Chest: Effort normal and breath sounds normal. He has no wheezes. He has no rales.  Abdominal: Soft. There is no tenderness.  Neurological: He is alert and oriented to person, place, and time. No cranial nerve deficit. He exhibits normal muscle tone.  Skin: Skin is warm. No rash noted.    ED Course  Procedures (including critical care time) Labs Review Labs Reviewed  BASIC METABOLIC PANEL - Abnormal; Notable for the following:    Glucose, Bld 101 (*)    BUN 28 (*)    Creatinine, Ser 7.07 (*)    GFR calc non Af Amer 8 (*)    GFR calc Af Amer 9 (*)    All other components within normal limits  CBC - Abnormal; Notable for the following:    Hemoglobin 10.9 (*)    HCT 35.4 (*)    MCH 24.1 (*)  RDW 17.8 (*)    All other components within normal limits  URINE RAPID DRUG SCREEN (HOSP PERFORMED)    Imaging Review Dg Chest 2 View  04/05/2014   CLINICAL DATA:  Hypertension with headache  EXAM: CHEST  2 VIEW  COMPARISON:  January 24, 2014  FINDINGS: There is no edema or consolidation. Heart is enlarged but stable. Pulmonary vascularity is normal. Central catheter tip is in the right atrium. No appreciable adenopathy. No pneumothorax. No bone lesions.  IMPRESSION: Cardiomegaly.  No edema or consolidation.   Electronically Signed   By: Lowella Grip M.D.   On: 04/05/2014 09:00   Ct Head Wo Contrast  04/05/2014   CLINICAL DATA:  Intermittent headache for 2 days; history of chronic dialysis dependent renal failure ; history of hypertension and vomiting  EXAM: CT HEAD WITHOUT CONTRAST  TECHNIQUE: Contiguous axial images were obtained from the base of the skull through the vertex without intravenous contrast.  COMPARISON:  Noncontrast CT scan of brain of March 27, 2014  FINDINGS: The ventricles are normal in size and position. There is no intracranial hemorrhage nor intracranial mass effect. No acute ischemic changes are  demonstrated. The cerebellum and brainstem are normal.  There is mucoperiosteal thickening within both maxillary sinuses. Minimal involvement of the ethmoid and right sphenoid sinus cells is demonstrated. There is no skull fracture nor lytic or blastic calvarial lesion.  IMPRESSION: 1. There is no acute intracranial hemorrhage nor other acute abnormality of the brain. 2. Inflammatory changes of the paranasal sinuses are demonstrated and are little changed since the previous study. These findings could be related to the patient's symptoms.   Electronically Signed   By: David  Martinique   On: 04/05/2014 08:45     EKG Interpretation   Date/Time:  Tuesday April 05 2014 04:11:37 EDT Ventricular Rate:  98 PR Interval:  164 QRS Duration: 85 QT Interval:  411 QTC Calculation: 525 R Axis:   -30 Text Interpretation:  Sinus rhythm Probable left atrial enlargement Left  ventricular hypertrophy Nonspecific T abnormalities, lateral leads  Prolonged QT interval No significant change since last tracing Confirmed  by Zenia Resides  MD, ANTHONY ([id number]) on 04/05/2014 8:22:19 AM      MDM   Final diagnoses:  Hypertensive urgency  ESRD on dialysis- Tues, Thurs, Sat in [employer], nephro: [name]   Patient with ESRD on PD presents with hypertensive urgency with SOB, HA and N/V.  Under control.  Blood pressure with initiation of home medications plus IV hydralazine and initiation of Nifedipine.  CT head negative for intracranial bleed. EKG without signs of ACS; Chest x-ray consistent with cardiomegaly unchanged from previous w/o edema.  Will admit for ongoing treatment of hypertensive urgency and will need Peritoneal dialysis.   Olam Idler, MD 04/05/14 [phone]

## 2014-04-05 NOTE — ED Notes (Signed)
Patient tried to urinate and was unable to

## 2014-04-05 NOTE — ED Notes (Signed)
Patient was seen at [facility] for same concern earlier this week.  Patient  Has chronic elevated blood pressure and was refractory to previous Treatments this week.

## 2014-04-06 LAB — CBC
HEMATOCRIT: 30.5 % — AB (ref 39.0–52.0)
Hemoglobin: 9.5 g/dL — ABNORMAL LOW (ref 13.0–17.0)
MCH: 23.5 pg — ABNORMAL LOW (ref 26.0–34.0)
MCHC: 31.1 g/dL (ref 30.0–36.0)
MCV: 75.5 fL — AB (ref 78.0–100.0)
Platelets: 201 10*3/uL (ref 150–400)
RBC: 4.04 MIL/uL — AB (ref 4.22–5.81)
RDW: 17.7 % — ABNORMAL HIGH (ref 11.5–15.5)
WBC: 4.7 10*3/uL (ref 4.0–10.5)

## 2014-04-06 LAB — COMPREHENSIVE METABOLIC PANEL
ALBUMIN: 2.4 g/dL — AB (ref 3.5–5.2)
ALK PHOS: 70 U/L (ref 39–117)
ALT: 15 U/L (ref 0–53)
AST: 18 U/L (ref 0–37)
Anion gap: 15 (ref 5–15)
BILIRUBIN TOTAL: 0.5 mg/dL (ref 0.3–1.2)
BUN: 24 mg/dL — ABNORMAL HIGH (ref 6–23)
CHLORIDE: 100 meq/L (ref 96–112)
CO2: 24 meq/L (ref 19–32)
CREATININE: 6.27 mg/dL — AB (ref 0.50–1.35)
Calcium: 8.5 mg/dL (ref 8.4–10.5)
GFR calc Af Amer: 11 mL/min — ABNORMAL LOW (ref >90)
GFR, EST NON AFRICAN AMERICAN: 9 mL/min — AB (ref >90)
Glucose, Bld: 83 mg/dL (ref 70–99)
POTASSIUM: 4.1 meq/L (ref 3.7–5.3)
Sodium: 139 mEq/L (ref 137–147)
Total Protein: 6.1 g/dL (ref 6.0–8.3)

## 2014-04-06 LAB — HEPATITIS B SURFACE ANTIGEN: HEP B S AG: NEGATIVE

## 2014-04-06 LAB — MRSA PCR SCREENING: MRSA by PCR: NEGATIVE

## 2014-04-06 MED ORDER — ACETAMINOPHEN 325 MG PO TABS
650.0000 mg | ORAL_TABLET | ORAL | Status: DC | PRN
  Administered 2014-04-06: 650 mg via ORAL
  Filled 2014-04-06: qty 2

## 2014-04-06 MED ORDER — NIFEDIPINE ER 30 MG PO TB24: 30.0000 mg | ORAL_TABLET | Freq: Every day | ORAL | Status: DC

## 2014-04-06 MED ORDER — ACETAMINOPHEN 325 MG PO TABS: 650.0000 mg | ORAL_TABLET | ORAL | Status: DC | PRN

## 2014-04-06 MED ORDER — CLONIDINE HCL 0.3 MG PO TABS: 0.3000 mg | ORAL_TABLET | Freq: Three times a day (TID) | ORAL | Status: DC

## 2014-04-06 MED ORDER — MINOXIDIL 2.5 MG PO TABS: 2.5000 mg | ORAL_TABLET | Freq: Every day | ORAL | Status: DC

## 2014-04-06 NOTE — Progress Notes (Signed)
CARE MANAGEMENT NOTE 04/06/2014  Patient:  Frank [name], Frank [name]   Account Number:  [id number]  Date Initiated:  04/06/2014  Documentation initiated by:  [facility]  Subjective/Objective Assessment:   ESRD, HTN     Action/Plan:   No NCM needs identified   Anticipated DC Date:  04/06/2014   Anticipated DC Plan:  [place]  CM consult      Choice offered to / List presented to:             Status of service:  Completed, signed off Medicare Important Message given?  NA - LOS <3 / Initial given by admissions (If response is "NO", the following Medicare IM given date fields will be blank) Date Medicare IM given:   Medicare IM given by:   Date Additional Medicare IM given:   Additional Medicare IM given by:    Discharge Disposition:  HOME/SELF CARE  Per UR Regulation:  Reviewed for med. necessity/level of care/duration of stay  If discussed at [place] of Stay Meetings, dates discussed:    Comments:

## 2014-04-06 NOTE — Progress Notes (Signed)
Patient refusing IV placement.  States that he will be leaving this morning and will not need one.  NP on call made aware.  Will continue to monitor.

## 2014-04-06 NOTE — Progress Notes (Signed)
Patient returned from hemodialysis.  Placed on box 6e23 for telemetry; currently NSR.  Refused admission questions at this time.  Requested for IV to be removed because of intense pain at IV site.  Stated ok to have new IV placed tomorrow morning.  IV removed.  Will continue to monitor.

## 2014-04-13 NOTE — Discharge Summary (Signed)
Physician Discharge Summary  Frank [name] NWG:[id number] DOB: November 12, 1965 DOA: 04/05/2014  PCP: Lolita Patella, MD  Admit date: 04/05/2014 Discharge date: 04/06/14  Time spent:   Recommendations for Outpatient Follow-up:  1.   Discharge Diagnoses:  Principal Problem:   Hypertensive urgency Active Problems:   ESRD on dialysis- Tues, Thurs, Sat in [facility], nephro: [name]   Anemia of chronic kidney failure   Discharge Condition: stable  Filed Weights   04/05/14 0326 04/05/14 1910 04/05/14 2311  Weight: 79.379 kg (175 lb) 78.5 kg (173 lb 1 oz) 75.6 kg (166 lb 10.7 oz)    History of present illness:   48 year old male who has a history of hypertension, hyperlipidemia, ESRD, status post failed renal transplant 8 years ago, on hemodialysis Tuesday Thursday and Sunday for last 5 years followed by Dr. Donnetta Simpers at [place] , [place] who came to the ED with chief complaint of headaches and was found to have hypertensive urgency. Patient was admitted with similar complaints on 03/27/2014 and signed out AMA from [place] on 03/28/2014.  He denies chest pain, no shortness of breath. He admits to having some nausea but no vomiting.  Denies abdominal pain.  Hospital Course:  Patient admitted to having run out of clonidine and minoxidil. These were resumed. Nephrology consulted and wrote dialysis orders. Blood pressure much improved by discharge. New Rx for clonidine and minoxidil written. He may resume other home medications. Headache better with better blood pressure control  Procedures:  hemodyalisis  Consultations:  nephrology  Discharge Exam: Filed Vitals:   04/06/14 0858  BP: 134/82  Pulse: 79  Temp: 97.8 F (36.6 C)  Resp: 17    General: comfortable Cardiovascular: RRR Respiratory: CTA Ext no CCE   Discharge Instructions   Activity as tolerated - No restrictions    Complete by:  As directed      Diet - low sodium heart healthy    Complete by:  As directed            Discharge Medication List as of 04/06/2014 11:52 AM    START taking these medications   Details  acetaminophen (TYLENOL) 325 MG tablet Take 2 tablets (650 mg total) by mouth every 4 (four) hours as needed for mild pain, fever or headache., Starting 04/06/2014, Until Discontinued, OTC    NIFEdipine (PROCARDIA-XL/ADALAT CC) 30 MG 24 hr tablet Take 1 tablet (30 mg total) by mouth daily., Starting 04/06/2014, Until Discontinued, Print      CONTINUE these medications which have CHANGED   Details  cloNIDine (CATAPRES) 0.3 MG tablet Take 1 tablet (0.3 mg total) by mouth 3 (three) times daily., Starting 04/06/2014, Until Discontinued, Print    minoxidil (LONITEN) 2.5 MG tablet Take 1 tablet (2.5 mg total) by mouth daily., Starting 04/06/2014, Until Discontinued, Print      CONTINUE these medications which have NOT CHANGED   Details  allopurinol (ZYLOPRIM) 100 MG tablet Take 100 mg by mouth daily., Until Discontinued, Historical Med    amLODipine (NORVASC) 5 MG tablet Take 10 mg by mouth daily., Until Discontinued, Historical Med    cinacalcet (SENSIPAR) 30 MG tablet Take 30 mg by mouth daily., Until Discontinued, Historical Med    diazepam (VALIUM) 5 MG tablet Take 1 tablet (5 mg total) by mouth 2 (two) times daily., Starting 01/25/2014, Until Discontinued, Print    furosemide (LASIX) 80 MG tablet Take 80 mg by mouth daily., Until Discontinued, Historical Med    hydrALAZINE (APRESOLINE) 25 MG tablet Take 50  mg by mouth 3 (three) times daily., Until Discontinued, Historical Med    isosorbide dinitrate (ISORDIL) 40 MG tablet Take 40 mg by mouth every 8 (eight) hours., Until Discontinued, Historical Med    lisinopril (PRINIVIL,ZESTRIL) 20 MG tablet Take 20 mg by mouth daily., Until Discontinued, Historical Med    omeprazole (PRILOSEC) 20 MG capsule Take 20 mg by mouth daily., Starting 07/01/2013, Until Discontinued, Historical Med    predniSONE (DELTASONE) 5 MG tablet Take 15 mg by  mouth daily with breakfast., Until Discontinued, Historical Med    sevelamer carbonate (RENVELA) 800 MG tablet Take 1,600 mg by mouth 3 (three) times daily with meals., Starting 12/27/2013, Last dose on Tue 12/27/14, Historical Med    tacrolimus (PROGRAF) 5 MG capsule Take 10 mg by mouth 2 (two) times daily. , Until Discontinued, Historical Med       Allergies  Allergen Reactions  . Ferrlecit [Na Ferric Gluc Cplx In Sucrose] Shortness Of Breath, Swelling and Other (See Comments)    Swelling in throat  . Darvocet [Propoxyphene N-Acetaminophen] Hives   Follow-up Information   Follow up with Lolita Patella, MD.   Contact information:   [place] [address] [phone]        The results of significant diagnostics from this hospitalization (including imaging, microbiology, ancillary and laboratory) are listed below for reference.    Significant Diagnostic Studies: Dg Chest 2 View  04/05/2014   CLINICAL DATA:  Hypertension with headache  EXAM: CHEST  2 VIEW  COMPARISON:  January 24, 2014  FINDINGS: There is no edema or consolidation. Heart is enlarged but stable. Pulmonary vascularity is normal. Central catheter tip is in the right atrium. No appreciable adenopathy. No pneumothorax. No bone lesions.  IMPRESSION: Cardiomegaly.  No edema or consolidation.   Electronically Signed   By: Lowella Grip M.D.   On: 04/05/2014 09:00   Ct Head Wo Contrast  04/05/2014   CLINICAL DATA:  Intermittent headache for 2 days; history of chronic dialysis dependent renal failure ; history of hypertension and vomiting  EXAM: CT HEAD WITHOUT CONTRAST  TECHNIQUE: Contiguous axial images were obtained from the base of the skull through the vertex without intravenous contrast.  COMPARISON:  Noncontrast CT scan of brain of March 27, 2014  FINDINGS: The ventricles are normal in size and position. There is no intracranial hemorrhage nor intracranial mass effect. No acute ischemic changes are  demonstrated. The cerebellum and brainstem are normal.  There is mucoperiosteal thickening within both maxillary sinuses. Minimal involvement of the ethmoid and right sphenoid sinus cells is demonstrated. There is no skull fracture nor lytic or blastic calvarial lesion.  IMPRESSION: 1. There is no acute intracranial hemorrhage nor other acute abnormality of the brain. 2. Inflammatory changes of the paranasal sinuses are demonstrated and are little changed since the previous study. These findings could be related to the patient's symptoms.   Electronically Signed   By: David  Martinique   On: 04/05/2014 08:45   Ct Head Wo Contrast  03/27/2014   CLINICAL DATA:  Severe headaches for 4 days. Hypertension. Dizziness.  EXAM: CT HEAD WITHOUT CONTRAST  TECHNIQUE: Contiguous axial images were obtained from the base of the skull through the vertex without intravenous contrast.  COMPARISON:  08/31/2012  FINDINGS: Technically limited study due to patient rotation. Ventricles and sulci appear symmetrical. No mass effect or midline shift. No abnormal extra-axial fluid collections. Gray-white matter junctions are distinct. Basal cisterns are not effaced. No evidence of acute  intracranial hemorrhage. No depressed skull fractures. Opacification of right maxillary antrum.  IMPRESSION: No acute intracranial abnormalities. Inflammatory changes in the paranasal sinuses.   Electronically Signed   By: Lucienne Capers M.D.   On: 03/27/2014 06:15   Dg Chest Portable 1 View  03/27/2014   CLINICAL DATA:  Headache, end-stage renal disease, shortness of breath  EXAM: PORTABLE CHEST - 1 VIEW  COMPARISON:  03/03/2014; 02/08/2014; 01/25/2014  FINDINGS: Grossly unchanged enlarged cardiac silhouette and mediastinal contours. Stable position of support apparatus. Overall improved aeration of the lungs. No new focal airspace opacities. No pleural effusion or pneumothorax. No evidence of edema.  IMPRESSION: 1.  No acute cardiopulmonary disease. 2.  Overall improved aeration along suggests resolving edema and atelectasis.   Electronically Signed   By: Sandi Mariscal M.D.   On: 03/27/2014 07:47    Microbiology: Recent Results (from the past 240 hour(s))  MRSA PCR SCREENING     Status: None   Collection Time    04/06/14 12:30 AM      Result Value Ref Range Status   MRSA by PCR NEGATIVE  NEGATIVE Final   Comment:            The GeneXpert MRSA Assay (FDA     approved for NASAL specimens     only), is one component of a     comprehensive MRSA colonization     surveillance program. It is not     intended to diagnose MRSA     infection nor to guide or     monitor treatment for     MRSA infections.     Labs: Basic Metabolic Panel: No results found for this basename: NA, K, CL, CO2, GLUCOSE, BUN, CREATININE, CALCIUM, MG, PHOS,  in the last 168 hours Liver Function Tests: No results found for this basename: AST, ALT, ALKPHOS, BILITOT, PROT, ALBUMIN,  in the last 168 hours No results found for this basename: LIPASE, AMYLASE,  in the last 168 hours No results found for this basename: AMMONIA,  in the last 168 hours CBC: No results found for this basename: WBC, NEUTROABS, HGB, HCT, MCV, PLT,  in the last 168 hours Cardiac Enzymes: No results found for this basename: CKTOTAL, CKMB, CKMBINDEX, TROPONINI,  in the last 168 hours BNP: BNP (last 3 results)  Recent Labs  12/12/13 1641 01/02/14 0317 01/18/14 0048  PROBNP >70000.0* >70000.0* >70000.0*   CBG: No results found for this basename: GLUCAP,  in the last 168 hours     Signed:  Storrs L  [facility] 04/13/2014, 7:20 PM

## 2014-04-15 ENCOUNTER — Emergency Department (HOSPITAL_COMMUNITY)
Admission: EM | Admit: 2014-04-15 | Discharge: 2014-04-15 | Disposition: A | Discharge status: Home / Self Care | Payer: Medicare Other | Attending: Emergency Medicine | Admitting: Emergency Medicine

## 2014-04-15 ENCOUNTER — Emergency Department (HOSPITAL_COMMUNITY): Payer: Medicare Other

## 2014-04-15 ENCOUNTER — Encounter (HOSPITAL_COMMUNITY): Payer: Self-pay | Admitting: Emergency Medicine

## 2014-04-15 DIAGNOSIS — IMO0002 Reserved for concepts with insufficient information to code with codable children: Secondary | ICD-10-CM | POA: Diagnosis not present

## 2014-04-15 DIAGNOSIS — R609 Edema, unspecified: Secondary | ICD-10-CM | POA: Insufficient documentation

## 2014-04-15 DIAGNOSIS — I150 Renovascular hypertension: Secondary | ICD-10-CM

## 2014-04-15 DIAGNOSIS — R000 Tachycardia, unspecified: Secondary | ICD-10-CM | POA: Diagnosis not present

## 2014-04-15 DIAGNOSIS — F411 Generalized anxiety disorder: Secondary | ICD-10-CM | POA: Diagnosis not present

## 2014-04-15 DIAGNOSIS — Z79899 Other long term (current) drug therapy: Secondary | ICD-10-CM | POA: Insufficient documentation

## 2014-04-15 DIAGNOSIS — I120 Hypertensive chronic kidney disease with stage 5 chronic kidney disease or end stage renal disease: Secondary | ICD-10-CM | POA: Diagnosis not present

## 2014-04-15 DIAGNOSIS — Z992 Dependence on renal dialysis: Secondary | ICD-10-CM | POA: Diagnosis not present

## 2014-04-15 DIAGNOSIS — E119 Type 2 diabetes mellitus without complications: Secondary | ICD-10-CM | POA: Diagnosis not present

## 2014-04-15 DIAGNOSIS — Z9115 Patient's noncompliance with renal dialysis: Secondary | ICD-10-CM

## 2014-04-15 DIAGNOSIS — N186 End stage renal disease: Secondary | ICD-10-CM | POA: Insufficient documentation

## 2014-04-15 DIAGNOSIS — F3289 Other specified depressive episodes: Secondary | ICD-10-CM | POA: Insufficient documentation

## 2014-04-15 DIAGNOSIS — F329 Major depressive disorder, single episode, unspecified: Secondary | ICD-10-CM | POA: Insufficient documentation

## 2014-04-15 DIAGNOSIS — Z87891 Personal history of nicotine dependence: Secondary | ICD-10-CM | POA: Diagnosis not present

## 2014-04-15 LAB — BASIC METABOLIC PANEL
Anion gap: 16 — ABNORMAL HIGH (ref 5–15)
BUN: 49 mg/dL — AB (ref 6–23)
CO2: 23 mEq/L (ref 19–32)
CREATININE: 10.88 mg/dL — AB (ref 0.50–1.35)
Calcium: 9.1 mg/dL (ref 8.4–10.5)
Chloride: 102 mEq/L (ref 96–112)
GFR calc Af Amer: 6 mL/min — ABNORMAL LOW (ref >90)
GFR calc non Af Amer: 5 mL/min — ABNORMAL LOW (ref >90)
Glucose, Bld: 107 mg/dL — ABNORMAL HIGH (ref 70–99)
Potassium: 4.9 mEq/L (ref 3.7–5.3)
Sodium: 141 mEq/L (ref 137–147)

## 2014-04-15 LAB — CBC WITH DIFFERENTIAL/PLATELET
BASOS ABS: 0 10*3/uL (ref 0.0–0.1)
Basophils Relative: 0 % (ref 0–1)
EOS ABS: 0.1 10*3/uL (ref 0.0–0.7)
Eosinophils Relative: 1 % (ref 0–5)
HCT: 30.5 % — ABNORMAL LOW (ref 39.0–52.0)
HEMOGLOBIN: 9.6 g/dL — AB (ref 13.0–17.0)
Lymphocytes Relative: 17 % (ref 12–46)
Lymphs Abs: 1.1 10*3/uL (ref 0.7–4.0)
MCH: 23.2 pg — ABNORMAL LOW (ref 26.0–34.0)
MCHC: 31.5 g/dL (ref 30.0–36.0)
MCV: 73.8 fL — AB (ref 78.0–100.0)
MONO ABS: 0.6 10*3/uL (ref 0.1–1.0)
MONOS PCT: 9 % (ref 3–12)
Neutro Abs: 4.9 10*3/uL (ref 1.7–7.7)
Neutrophils Relative %: 73 % (ref 43–77)
Platelets: 219 10*3/uL (ref 150–400)
RBC: 4.13 MIL/uL — ABNORMAL LOW (ref 4.22–5.81)
RDW: 17.6 % — ABNORMAL HIGH (ref 11.5–15.5)
WBC: 6.7 10*3/uL (ref 4.0–10.5)

## 2014-04-15 LAB — PRO B NATRIURETIC PEPTIDE: Pro B Natriuretic peptide (BNP): >70000 pg/mL — ABNORMAL HIGH (ref 0–125)

## 2014-04-15 MED ORDER — LISINOPRIL 20 MG PO TABS
20.0000 mg | ORAL_TABLET | Freq: Once | ORAL | Status: AC
  Administered 2014-04-15: 20 mg via ORAL
  Filled 2014-04-15: qty 1

## 2014-04-15 MED ORDER — CLONIDINE HCL 0.2 MG PO TABS
0.3000 mg | ORAL_TABLET | Freq: Once | ORAL | Status: AC
  Administered 2014-04-15: 0.3 mg via ORAL
  Filled 2014-04-15 (×2): qty 1

## 2014-04-15 NOTE — ED Notes (Signed)
PT to xray; first IV attempt failed.

## 2014-04-15 NOTE — ED Notes (Signed)
Pt coming from home.  Pt states that his dialysis was moved from nights to days in [facility] but pt is unable to get a ride in the days.  Pt states he is being transferred to nontunneled dialysis on [address] in [place].  Pt states he was last dialysised on Tuesday.  Pt is to get dialysis on Tuesday, Thursday and Sunday.

## 2014-04-15 NOTE — ED Notes (Signed)
PA at bedside.

## 2014-04-15 NOTE — ED Notes (Signed)
PT ambulated with baseline gait; VSS; A&Ox3; no signs of distress; respirations even and unlabored; skin warm and dry; no questions upon discharge.  

## 2014-04-15 NOTE — ED Provider Notes (Signed)
CSN: [id number]     Arrival date & time 04/15/14  6606 History   First MD Initiated Contact with Patient 04/15/14 [phone]     No chief complaint on file.    (Consider location/radiation/quality/duration/timing/severity/associated sxs/prior Treatment) HPI  48 year old male with history of end-stage renal disease currently on Tuesday Thursday Sunday dialysis, hypertension, diabetes presents from home requesting dialysis.  Pt has been doing home dialysis since 2002.  For the past 2 years he attend a dialysis center in [place] on [address].  His dialysis appointment are night time and he has ride arrangement to accomodate.  However, recently the dialysis center transition his appointment to day time, and he doesn't have transportation available thus missing his last dialysis yesterday.  He is currently plan on attending Dialysis at [facility] in [place] starting next weds.  He is here requesting for dialysis in the mean time.  Aside from having increase fluid gain and increase SOB from missing his dialysis, he has no other complaint.  No fever, headache, cp, productive cough, abd pain, back pain.  Pt report his BP has been not well controlled and he's on 7 different BP medications.  There has been some recent medication changes which causes him to have headache.  Sts his doctor is actively trying to adjust his medication.  He did not take his BP medication this morning because he usually take medicine with food, but haven't ate yet.    Past Medical History  Diagnosis Date  . Hypertension   . ESRD (end stage renal disease)     due to HTN per patient, followed at [facility], s/p failed kidney transplant  . Depression   . Complication of anesthesia     itching, sore throat  . Diabetes mellitus without complication     No history per patient, but remains under history as A1c would not be accurate given on dialysis  . Shortness of breath   . Anxiety    Past Surgical History  Procedure Laterality Date  . Kidney  receipient  2006    failed and started HD in March 2014  . Capd insertion    . Capd removal     No family history on file. History  Substance Use Topics  . Smoking status: Former Smoker -- 1.00 packs/day for 1 years    Types: Cigarettes  . Smokeless tobacco: Never Used     Comment: quit Jan 2014  . Alcohol Use: No    Review of Systems  All other systems reviewed and are negative.     Allergies  Ferrlecit and Darvocet  Home Medications   Prior to Admission medications   Medication Sig Start Date End Date Taking? Authorizing Provider  acetaminophen (TYLENOL) 325 MG tablet Take 2 tablets (650 mg total) by mouth every 4 (four) hours as needed for mild pain, fever or headache. 04/06/14   Delfina Redwood, MD  allopurinol (ZYLOPRIM) 100 MG tablet Take 100 mg by mouth daily.    Historical Provider, MD  amLODipine (NORVASC) 5 MG tablet Take 10 mg by mouth daily.    Historical Provider, MD  cinacalcet (SENSIPAR) 30 MG tablet Take 30 mg by mouth daily.    Historical Provider, MD  cloNIDine (CATAPRES) 0.3 MG tablet Take 1 tablet (0.3 mg total) by mouth 3 (three) times daily. 04/06/14   Delfina Redwood, MD  diazepam (VALIUM) 5 MG tablet Take 1 tablet (5 mg total) by mouth 2 (two) times daily. 01/25/14   [name], PA-C  furosemide (LASIX) 80 MG tablet Take 80 mg by mouth daily.    Historical Provider, MD  hydrALAZINE (APRESOLINE) 25 MG tablet Take 50 mg by mouth 3 (three) times daily.    Historical Provider, MD  isosorbide dinitrate (ISORDIL) 40 MG tablet Take 40 mg by mouth every 8 (eight) hours.    Historical Provider, MD  lisinopril (PRINIVIL,ZESTRIL) 20 MG tablet Take 20 mg by mouth daily.    Historical Provider, MD  minoxidil (LONITEN) 2.5 MG tablet Take 1 tablet (2.5 mg total) by mouth daily. 04/06/14   Delfina Redwood, MD  NIFEdipine (PROCARDIA-XL/ADALAT CC) 30 MG 24 hr tablet Take 1 tablet (30 mg total) by mouth daily. 04/06/14   Delfina Redwood, MD  omeprazole (PRILOSEC)  20 MG capsule Take 20 mg by mouth daily. 07/01/13   Historical Provider, MD  predniSONE (DELTASONE) 5 MG tablet Take 15 mg by mouth daily with breakfast.    Historical Provider, MD  sevelamer carbonate (RENVELA) 800 MG tablet Take 1,600 mg by mouth 3 (three) times daily with meals. 12/27/13 12/27/14  Historical Provider, MD  tacrolimus (PROGRAF) 5 MG capsule Take 10 mg by mouth 2 (two) times daily.     Historical Provider, MD   BP 183/135  Pulse 99  Temp(Src) 97.8 F (36.6 C) (Oral)  Resp 22  Ht _0  (1.88 m)  SpO2 97% Physical Exam  Constitutional: He appears well-developed and well-nourished. No distress.  HENT:  Head: Atraumatic.  Eyes: Conjunctivae are normal.  Neck: Normal range of motion. Neck supple. No JVD present.  Cardiovascular:  Mild tachycardia  Pulmonary/Chest: Effort normal and breath sounds normal.  Faint crackles at base of lungs  Abdominal: Soft. There is no tenderness.  Musculoskeletal: He exhibits edema (trace pitting edema to BLE bilat.).  Neurological: He is alert.  Skin: No rash noted.  Psychiatric: He has a normal mood and affect.    ED Course  Procedures (including critical care time)  7:32 AM Pt with missed dialysis requesting for dialysis as he transition from one dialysis center to a different one.  Pt currently in NAD.  Pt is hypertensive, but haven't take his BP medication today, and also missed recent dialysis session.    9:24 AM Normal K+, CXR without significant pleural effusion. Hgb 9.6, at baseline.  ECG with prolonged QT interval, but no acute ischemic changes. Pt resting comfortably.  I gave him some of his morning BP medication.  Plan to consult nephrologist for scheduled dialysis.    10:25 AM I have consulted with our nephrologist, Dr. Cammie Sickle who request pt's Nephrologist to be consulted for outpt dialysis as pt has no emergent condition at this time.  I have consulted with pt's nephrologist, Dr. Kern Alberta, who sts pt has a scheduled appointment  for today at 11:25am.  I encourage pt to make it to his appointment.  I offer social worker to help arrange transportation if needed, pt declined.  Pt stable for discharge.  Return precaution discussed.  Care discussed with Dr. Dina Rich.   Labs Review Labs Reviewed  CBC WITH DIFFERENTIAL - Abnormal; Notable for the following:    RBC 4.13 (*)    Hemoglobin 9.6 (*)    HCT 30.5 (*)    MCV 73.8 (*)    MCH 23.2 (*)    RDW 17.6 (*)    All other components within normal limits  BASIC METABOLIC PANEL - Abnormal; Notable for the following:    Glucose, Bld 107 (*)    BUN  49 (*)    Creatinine, Ser 10.88 (*)    GFR calc non Af Amer 5 (*)    GFR calc Af Amer 6 (*)    Anion gap 16 (*)    All other components within normal limits  PRO B NATRIURETIC PEPTIDE - Abnormal; Notable for the following:    Pro B Natriuretic peptide (BNP) >70000.0 (*)    All other components within normal limits    Imaging Review Dg Chest 2 View  04/15/2014   CLINICAL DATA:  Shortness of breath for 1 day. The patient missed his dialysis appointment.  EXAM: CHEST  2 VIEW  COMPARISON:  PA and lateral chest 04/05/2014.  FINDINGS: Right IJ approach dialysis catheter remains in place. There is marked cardiomegaly but no edema. Lungs are clear. No pneumothorax or pleural effusion.  IMPRESSION: Cardiomegaly without acute disease.   Electronically Signed   By: Inge Rise M.D.   On: 04/15/2014 07:51     EKG Interpretation   Date/Time:  Friday April 15 2014 08:38:58 EDT Ventricular Rate:  89 PR Interval:  146 QRS Duration: 84 QT Interval:  420 QTC Calculation: 511 R Axis:   -28 Text Interpretation:  Sinus rhythm Probable left atrial enlargement Left  ventricular hypertrophy Borderline T abnormalities, lateral leads  Prolonged QT interval No significant change since last tracing Confirmed  by HORTON  MD, [place] ([id number]) on 04/15/2014 8:44:13 AM      MDM   Final diagnoses:  Dialysis patient, noncompliant   Renovascular hypertension    BP 152/113  Pulse 82  Temp(Src) 97.8 F (36.6 C) (Oral)  Resp 19  Ht 6' 2" (1.88 m)  SpO2 95%  I have reviewed nursing notes and vital signs. I personally reviewed the imaging tests through PACS system  I reviewed available ER/hospitalization records thought the EMR     [name], PA-C 04/15/14 1031

## 2014-04-15 NOTE — Discharge Instructions (Signed)
Please make it to your scheduled dialysis today at 11:25am as previously scheduled.  Return if your condition worsen or if you have other concerns.  Take your blood pressure medication as it is high today.

## 2014-04-16 NOTE — ED Provider Notes (Signed)
Medical screening examination/treatment/procedure(s) were performed by non-physician practitioner and as supervising physician I was immediately available for consultation/collaboration.   EKG Interpretation   Date/Time:  Friday April 15 2014 08:38:58 EDT Ventricular Rate:  89 PR Interval:  146 QRS Duration: 84 QT Interval:  420 QTC Calculation: 511 R Axis:   -28 Text Interpretation:  Sinus rhythm Probable left atrial enlargement Left  ventricular hypertrophy Borderline T abnormalities, lateral leads  Prolonged QT interval No significant change since last tracing Confirmed  by Dina Rich  MD, COURTNEY ([id number]) on 04/15/2014 8:44:13 AM        Merryl Hacker, MD 04/16/14 [id number]

## 2014-05-05 ENCOUNTER — Encounter (HOSPITAL_COMMUNITY): Payer: Self-pay | Admitting: Emergency Medicine

## 2014-05-05 ENCOUNTER — Emergency Department (HOSPITAL_COMMUNITY): Payer: Medicare Other

## 2014-05-05 ENCOUNTER — Emergency Department (HOSPITAL_COMMUNITY)
Admission: EM | Admit: 2014-05-05 | Discharge: 2014-05-05 | Disposition: A | Discharge status: Home / Self Care | Payer: Medicare Other | Source: Home / Self Care | Attending: Emergency Medicine | Admitting: Emergency Medicine

## 2014-05-05 DIAGNOSIS — G4739 Other sleep apnea: Secondary | ICD-10-CM

## 2014-05-05 DIAGNOSIS — F329 Major depressive disorder, single episode, unspecified: Secondary | ICD-10-CM

## 2014-05-05 DIAGNOSIS — I120 Hypertensive chronic kidney disease with stage 5 chronic kidney disease or end stage renal disease: Secondary | ICD-10-CM | POA: Insufficient documentation

## 2014-05-05 DIAGNOSIS — J029 Acute pharyngitis, unspecified: Secondary | ICD-10-CM

## 2014-05-05 DIAGNOSIS — M542 Cervicalgia: Secondary | ICD-10-CM | POA: Insufficient documentation

## 2014-05-05 DIAGNOSIS — Z992 Dependence on renal dialysis: Secondary | ICD-10-CM

## 2014-05-05 DIAGNOSIS — F419 Anxiety disorder, unspecified: Secondary | ICD-10-CM

## 2014-05-05 DIAGNOSIS — Z7952 Long term (current) use of systemic steroids: Secondary | ICD-10-CM

## 2014-05-05 DIAGNOSIS — Z79899 Other long term (current) drug therapy: Secondary | ICD-10-CM

## 2014-05-05 DIAGNOSIS — I2699 Other pulmonary embolism without acute cor pulmonale: Secondary | ICD-10-CM | POA: Diagnosis not present

## 2014-05-05 DIAGNOSIS — G4731 Primary central sleep apnea: Secondary | ICD-10-CM | POA: Insufficient documentation

## 2014-05-05 DIAGNOSIS — Z87891 Personal history of nicotine dependence: Secondary | ICD-10-CM | POA: Insufficient documentation

## 2014-05-05 DIAGNOSIS — N186 End stage renal disease: Secondary | ICD-10-CM | POA: Insufficient documentation

## 2014-05-05 DIAGNOSIS — R0602 Shortness of breath: Secondary | ICD-10-CM | POA: Diagnosis not present

## 2014-05-05 HISTORY — DX: Primary central sleep apnea: G47.31

## 2014-05-05 HISTORY — DX: Other sleep apnea: G47.39

## 2014-05-05 LAB — COMPREHENSIVE METABOLIC PANEL
ALBUMIN: 2.7 g/dL — AB (ref 3.5–5.2)
ALT: 20 U/L (ref 0–53)
AST: 26 U/L (ref 0–37)
Alkaline Phosphatase: 70 U/L (ref 39–117)
Anion gap: 20 — ABNORMAL HIGH (ref 5–15)
BUN: 70 mg/dL — AB (ref 6–23)
CALCIUM: 8.7 mg/dL (ref 8.4–10.5)
CO2: 19 mEq/L (ref 19–32)
Chloride: 104 mEq/L (ref 96–112)
Creatinine, Ser: 14.53 mg/dL — ABNORMAL HIGH (ref 0.50–1.35)
GFR calc Af Amer: 4 mL/min — ABNORMAL LOW (ref >90)
GFR calc non Af Amer: 3 mL/min — ABNORMAL LOW (ref >90)
Glucose, Bld: 90 mg/dL (ref 70–99)
POTASSIUM: 5.5 meq/L — AB (ref 3.7–5.3)
SODIUM: 143 meq/L (ref 137–147)
TOTAL PROTEIN: 6.6 g/dL (ref 6.0–8.3)
Total Bilirubin: 0.3 mg/dL (ref 0.3–1.2)

## 2014-05-05 LAB — CBC WITH DIFFERENTIAL/PLATELET
BASOS PCT: 0 % (ref 0–1)
Basophils Absolute: 0 10*3/uL (ref 0.0–0.1)
EOS ABS: 0.1 10*3/uL (ref 0.0–0.7)
EOS PCT: 1 % (ref 0–5)
HCT: 29.7 % — ABNORMAL LOW (ref 39.0–52.0)
Hemoglobin: 9.3 g/dL — ABNORMAL LOW (ref 13.0–17.0)
LYMPHS ABS: 1.8 10*3/uL (ref 0.7–4.0)
Lymphocytes Relative: 26 % (ref 12–46)
MCH: 23.3 pg — AB (ref 26.0–34.0)
MCHC: 31.3 g/dL (ref 30.0–36.0)
MCV: 74.4 fL — AB (ref 78.0–100.0)
MONOS PCT: 14 % — AB (ref 3–12)
Monocytes Absolute: 1 10*3/uL (ref 0.1–1.0)
Neutro Abs: 4 10*3/uL (ref 1.7–7.7)
Neutrophils Relative %: 58 % (ref 43–77)
PLATELETS: 158 10*3/uL (ref 150–400)
RBC: 3.99 MIL/uL — AB (ref 4.22–5.81)
RDW: 18.4 % — ABNORMAL HIGH (ref 11.5–15.5)
WBC: 6.8 10*3/uL (ref 4.0–10.5)

## 2014-05-05 LAB — RAPID STREP SCREEN (MED CTR MEBANE ONLY): Streptococcus, Group A Screen (Direct): NEGATIVE

## 2014-05-05 MED ORDER — CYCLOBENZAPRINE HCL 10 MG PO TABS: 10.0000 mg | ORAL_TABLET | Freq: Three times a day (TID) | ORAL | Status: DC | PRN

## 2014-05-05 MED ORDER — TRAMADOL HCL 50 MG PO TABS: 100.0000 mg | ORAL_TABLET | Freq: Four times a day (QID) | ORAL | Status: DC | PRN

## 2014-05-05 MED ORDER — DIAZEPAM 5 MG/ML IJ SOLN
5.0000 mg | Freq: Once | INTRAMUSCULAR | Status: AC
  Administered 2014-05-05: 5 mg via INTRAVENOUS
  Filled 2014-05-05: qty 2

## 2014-05-05 MED ORDER — FENTANYL CITRATE 0.05 MG/ML IJ SOLN
50.0000 ug | Freq: Once | INTRAMUSCULAR | Status: AC
  Administered 2014-05-05: 50 ug via INTRAVENOUS
  Filled 2014-05-05: qty 2

## 2014-05-05 MED ORDER — IOHEXOL 300 MG/ML  SOLN
75.0000 mL | Freq: Once | INTRAMUSCULAR | Status: AC | PRN
  Administered 2014-05-05: 75 mL via INTRAVENOUS

## 2014-05-05 MED ORDER — ONDANSETRON HCL 4 MG/2ML IJ SOLN
4.0000 mg | Freq: Once | INTRAMUSCULAR | Status: AC
  Administered 2014-05-05: 4 mg via INTRAVENOUS
  Filled 2014-05-05: qty 2

## 2014-05-05 NOTE — Discharge Instructions (Signed)
Try ice and heat on your neck. Take the medications as prescribed. Have your doctor recheck you if you continue to have pain. Return to the ED if you get a fever, are unable to swallow or have difficulty breathing.

## 2014-05-05 NOTE — ED Notes (Addendum)
Pt reporting sore throat and pain in back of neck. Reports pain began earlier tonight. Denies any cough. Pt reports having dialysis today, the third of three treatments.

## 2014-05-05 NOTE — ED Provider Notes (Signed)
CSN: [id number]     Arrival date & time 05/05/14  0300 History   First MD Initiated Contact with Patient 05/05/14 [phone]     Chief Complaint  Patient presents with  . Neck Pain  . Sore Throat     (Consider location/radiation/quality/duration/timing/severity/associated sxs/prior Treatment) HPI Patient has end-stage renal disease and states he normally gets dialysis on Monday, Wednesday, and Friday. He states however this week he had dialysis on Monday (2 days ago) and yesterday because of excess fluid. He states his next dialysis will be in 2 days. Patient states he is having pain in the back of his neck that he describes as an achiness and pain in his throat and states it hurts to swallow. He also states sometimes he feels like he is having some difficulty breathing. He denies any dental problems or toothache. He has had chills but is not sure about fever. He states he has pain when he looks up or down but not really looks from left to right. He denies nausea, vomiting, or diarrhea. He states he's never had this before. He does not have a headche.    PCP DR Kern Alberta in [place]  Past Medical History  Diagnosis Date  . Hypertension   . ESRD (end stage renal disease)     due to HTN per patient, followed at [facility], s/p failed kidney transplant  . Depression   . Complication of anesthesia     itching, sore throat  . Diabetes mellitus without complication     No history per patient, but remains under history as A1c would not be accurate given on dialysis  . Shortness of breath   . Anxiety    Past Surgical History  Procedure Laterality Date  . Kidney receipient  2006    failed and started HD in March 2014  . Capd insertion    . Capd removal     History reviewed. No pertinent family history. History  Substance Use Topics  . Smoking status: Former Smoker -- 1.00 packs/day for 1 years    Types: Cigarettes  . Smokeless tobacco: Never Used     Comment: quit Jan 2014  . Alcohol  Use: No  lives at home Lives alone  Review of Systems  All other systems reviewed and are negative.     Allergies  Ferrlecit and Darvocet  Home Medications   Prior to Admission medications   Medication Sig Start Date End Date Taking? Authorizing Provider  acetaminophen (TYLENOL) 325 MG tablet Take 2 tablets (650 mg total) by mouth every 4 (four) hours as needed for mild pain, fever or headache. 04/06/14   Delfina Redwood, MD  allopurinol (ZYLOPRIM) 100 MG tablet Take 100 mg by mouth daily.    Historical Provider, MD  amLODipine (NORVASC) 5 MG tablet Take 10 mg by mouth daily.    Historical Provider, MD  cinacalcet (SENSIPAR) 30 MG tablet Take 30 mg by mouth daily.    Historical Provider, MD  cloNIDine (CATAPRES) 0.3 MG tablet Take 1 tablet (0.3 mg total) by mouth 3 (three) times daily. 04/06/14   Delfina Redwood, MD  cyclobenzaprine (FLEXERIL) 10 MG tablet Take 1 tablet (10 mg total) by mouth 3 (three) times daily as needed. 05/05/14   Janice Norrie, MD  diazepam (VALIUM) 5 MG tablet Take 1 tablet (5 mg total) by mouth 2 (two) times daily. 01/25/14   [name], PA-C  furosemide (LASIX) 80 MG tablet Take 80 mg by mouth daily.  Historical Provider, MD  hydrALAZINE (APRESOLINE) 25 MG tablet Take 50 mg by mouth 3 (three) times daily.    Historical Provider, MD  isosorbide dinitrate (ISORDIL) 40 MG tablet Take 40 mg by mouth every 8 (eight) hours.    Historical Provider, MD  lisinopril (PRINIVIL,ZESTRIL) 20 MG tablet Take 20 mg by mouth daily.    Historical Provider, MD  minoxidil (LONITEN) 2.5 MG tablet Take 1 tablet (2.5 mg total) by mouth daily. 04/06/14   Delfina Redwood, MD  NIFEdipine (PROCARDIA-XL/ADALAT CC) 30 MG 24 hr tablet Take 1 tablet (30 mg total) by mouth daily. 04/06/14   Delfina Redwood, MD  omeprazole (PRILOSEC) 20 MG capsule Take 20 mg by mouth daily. 07/01/13   Historical Provider, MD  predniSONE (DELTASONE) 5 MG tablet Take 15 mg by mouth daily with breakfast.     Historical Provider, MD  sevelamer carbonate (RENVELA) 800 MG tablet Take 1,600 mg by mouth 3 (three) times daily with meals. 12/27/13 12/27/14  Historical Provider, MD  tacrolimus (PROGRAF) 5 MG capsule Take 10 mg by mouth 2 (two) times daily.     Historical Provider, MD  traMADol (ULTRAM) 50 MG tablet Take 2 tablets (100 mg total) by mouth every 6 (six) hours as needed. 05/05/14   Janice Norrie, MD   BP 161/108  Pulse 75  Temp(Src) 98.1 F (36.7 C) (Oral)  Resp 14  Ht _0  (1.88 m)  Wt 178 lb (80.74 kg)  BMI 22.84 kg/m2  SpO2 92%  Vital signs normal   Physical Exam  Nursing note and vitals reviewed. Constitutional: He is oriented to person, place, and time. He appears well-developed and well-nourished.  Non-toxic appearance. He does not appear ill. No distress.  Patient sitting on side of stretcher with his eyes closed and his head bobs at times like he is asleep  HENT:  Head: Normocephalic and atraumatic.  Right Ear: External ear normal.  Left Ear: External ear normal.  Nose: Nose normal. No mucosal edema or rhinorrhea.  Mouth/Throat: Oropharynx is clear and moist and mucous membranes are normal. No dental abscesses or uvula swelling.  Eyes: Conjunctivae and EOM are normal. Pupils are equal, round, and reactive to light.  Neck: Normal range of motion and full passive range of motion without pain. Neck supple.  Patient has some cervical lymphadenopathy bilaterally. He has some tenderness to palpation in his anterior neck. There is no obvious swelling, redness, or crepitance. He also has tenderness diffusely of his posterior neck and of the large paraspinous muscles that have firmness to palpation.  Cardiovascular: Normal rate, regular rhythm and normal heart sounds.  Exam reveals no gallop and no friction rub.   No murmur heard. Pulmonary/Chest: Effort normal and breath sounds normal. No respiratory distress. He has no wheezes. He has no rhonchi. He has no rales. He exhibits no  tenderness and no crepitus.  Abdominal: Soft. Normal appearance and bowel sounds are normal. He exhibits no distension. There is no tenderness. There is no rebound and no guarding.  Musculoskeletal: Normal range of motion. He exhibits no edema and no tenderness.  Moves all extremities well.   Neurological: He is alert and oriented to person, place, and time. He has normal strength. No cranial nerve deficit.  Skin: Skin is warm, dry and intact. No rash noted. No erythema. No pallor.  Psychiatric: He has a normal mood and affect. His speech is normal and behavior is normal. His mood appears not anxious.    ED Course  Procedures (including critical care time)   Medications  fentaNYL (SUBLIMAZE) injection 50 mcg (50 mcg Intravenous Given 05/05/14 0417)  ondansetron (ZOFRAN) injection 4 mg (4 mg Intravenous Given 05/05/14 0416)  iohexol (OMNIPAQUE) 300 MG/ML solution 75 mL (75 mLs Intravenous Contrast Given 05/05/14 0436)  diazepam (VALIUM) injection 5 mg (5 mg Intravenous Given 05/05/14 0609)    Pt is sleeping and in NAD at discharge.    Labs Review Results for orders placed during the hospital encounter of 05/05/14  RAPID STREP SCREEN      Result Value Ref Range   Streptococcus, Group A Screen (Direct) NEGATIVE  NEGATIVE  CBC WITH DIFFERENTIAL      Result Value Ref Range   WBC 6.8  4.0 - 10.5 K/uL   RBC 3.99 (*) 4.22 - 5.81 MIL/uL   Hemoglobin 9.3 (*) 13.0 - 17.0 g/dL   HCT 29.7 (*) 39.0 - 52.0 %   MCV 74.4 (*) 78.0 - 100.0 fL   MCH 23.3 (*) 26.0 - 34.0 pg   MCHC 31.3  30.0 - 36.0 g/dL   RDW 18.4 (*) 11.5 - 15.5 %   Platelets 158  150 - 400 K/uL   Neutrophils Relative % 58  43 - 77 %   Neutro Abs 4.0  1.7 - 7.7 K/uL   Lymphocytes Relative 26  12 - 46 %   Lymphs Abs 1.8  0.7 - 4.0 K/uL   Monocytes Relative 14 (*) 3 - 12 %   Monocytes Absolute 1.0  0.1 - 1.0 K/uL   Eosinophils Relative 1  0 - 5 %   Eosinophils Absolute 0.1  0.0 - 0.7 K/uL   Basophils Relative 0  0 - 1 %    Basophils Absolute 0.0  0.0 - 0.1 K/uL  COMPREHENSIVE METABOLIC PANEL      Result Value Ref Range   Sodium 143  137 - 147 mEq/L   Potassium 5.5 (*) 3.7 - 5.3 mEq/L   Chloride 104  96 - 112 mEq/L   CO2 19  19 - 32 mEq/L   Glucose, Bld 90  70 - 99 mg/dL   BUN 70 (*) 6 - 23 mg/dL   Creatinine, Ser 14.53 (*) 0.50 - 1.35 mg/dL   Calcium 8.7  8.4 - 10.5 mg/dL   Total Protein 6.6  6.0 - 8.3 g/dL   Albumin 2.7 (*) 3.5 - 5.2 g/dL   AST 26  0 - 37 U/L   ALT 20  0 - 53 U/L   Alkaline Phosphatase 70  39 - 117 U/L   Total Bilirubin 0.3  0.3 - 1.2 mg/dL   GFR calc non Af Amer 3 (*) >90 mL/min   GFR calc Af Amer 4 (*) >90 mL/min   Anion gap 20 (*) 5 - 15    Laboratory interpretation all normal except borderline hyperkalemia, chronic renal failure, stable anemia   Imaging Review Ct Soft Tissue Neck W Contrast  05/05/2014   CLINICAL DATA:  Initial evaluation for acute sore throat, difficulty swallowing, difficulty breathing, neck stiffness.  EXAM: CT NECK WITH CONTRAST  TECHNIQUE: Multidetector CT imaging of the neck was performed using the standard protocol following the bolus administration of intravenous contrast.  CONTRAST:  14m OMNIPAQUE IOHEXOL 300 MG/ML  SOLN  COMPARISON:  None available.  FINDINGS: Visualized brain and posterior fossa are within normal limits.  Visualized orbits are unremarkable.  Sequelae of chronic sinus disease seen about the maxillary and sphenoid sinuses. There is moderate mucoperiosteal thickening within the  maxillary sinuses bilaterally. Mastoid air cells are underpneumatized.  The salivary glands including the parotid glands and submandibular glands are within normal limits.  Oral cavity is unremarkable without evidence of mass lesion or loculated fluid collection. Palatine tonsils are symmetric without acute abnormality. Parapharyngeal fat is preserved. Oropharynx and nasopharynx within normal limits. No retropharyngeal fluid collection. Hypopharynx and supraglottic  larynx are within normal limits. Airway is widely patent. Epiglottis normal. Vallecula is clear. Lingual tonsils within normal limits.  True vocal cords are symmetric bilaterally. Subglottic airway is clear.  Thyroid gland within normal limits.  No adenopathy identified within the neck. Enlarged mediastinal lymph nodes measuring up to 1.6 cm in short axis seen near the carina (series 6, image 137).  Partially visualized lungs are clear.  Normal intravascular enhancement seen throughout the neck.  No acute osseous abnormality. No worrisome lytic or blastic osseous lesions.  IMPRESSION: 1. No acute abnormality identified within the neck. 2. Enlarged mediastinal lymph nodes measuring up to 1.6 cm in short axis. Findings are of uncertain etiology, and are incompletely evaluated on this exam. No cervical adenopathy identified.   Electronically Signed   By: Jeannine Boga M.D.   On: 05/05/2014 05:41    Dg Chest 2 View  04/15/2014   CLINICAL DATA:  Shortness of breath for 1 day. The patient missed his dialysis appointment.   IMPRESSION: Cardiomegaly without acute disease.   Electronically Signed   By: Inge Rise M.D.   On: 04/15/2014 07:51   Dg Chest 2 View  04/05/2014   CLINICAL DATA:  Hypertension with headache  .  IMPRESSION: Cardiomegaly.  No edema or consolidation.   Electronically Signed   By: Lowella Grip M.D.   On: 04/05/2014 09:00   Ct Head Wo Contrast  04/05/2014   CLINICAL DATA:  Intermittent headache for 2 days; history of chronic dialysis dependent renal failure ; history of hypertension and vomiting  IMPRESSION: 1. There is no acute intracranial hemorrhage nor other acute abnormality of the brain. 2. Inflammatory changes of the paranasal sinuses are demonstrated and are little changed since the previous study. These findings could be related to the patient's symptoms.   Electronically Signed   By: David  Martinique   On: 04/05/2014 08:45     EKG Interpretation None      MDM    Final diagnoses:  Sore throat  Neck pain    New Prescriptions   CYCLOBENZAPRINE (FLEXERIL) 10 MG TABLET    Take 1 tablet (10 mg total) by mouth 3 (three) times daily as needed.   TRAMADOL (ULTRAM) 50 MG TABLET    Take 2 tablets (100 mg total) by mouth every 6 (six) hours as needed.    Plan discharge  Rolland Porter, MD, Alanson Aly, MD 05/05/14 [phone]

## 2014-05-06 ENCOUNTER — Emergency Department (HOSPITAL_COMMUNITY): Payer: Medicare Other

## 2014-05-06 ENCOUNTER — Emergency Department (HOSPITAL_COMMUNITY)
Admission: EM | Admit: 2014-05-06 | Discharge: 2014-05-06 | Disposition: A | Discharge status: Home / Self Care | Payer: Medicare Other | Source: Home / Self Care | Attending: Emergency Medicine | Admitting: Emergency Medicine

## 2014-05-06 ENCOUNTER — Encounter (HOSPITAL_COMMUNITY): Payer: Self-pay | Admitting: Emergency Medicine

## 2014-05-06 DIAGNOSIS — E119 Type 2 diabetes mellitus without complications: Secondary | ICD-10-CM

## 2014-05-06 DIAGNOSIS — R0602 Shortness of breath: Secondary | ICD-10-CM

## 2014-05-06 DIAGNOSIS — Z7951 Long term (current) use of inhaled steroids: Secondary | ICD-10-CM

## 2014-05-06 DIAGNOSIS — Z79899 Other long term (current) drug therapy: Secondary | ICD-10-CM | POA: Insufficient documentation

## 2014-05-06 DIAGNOSIS — F419 Anxiety disorder, unspecified: Secondary | ICD-10-CM | POA: Insufficient documentation

## 2014-05-06 DIAGNOSIS — Z87891 Personal history of nicotine dependence: Secondary | ICD-10-CM

## 2014-05-06 DIAGNOSIS — Z940 Kidney transplant status: Secondary | ICD-10-CM

## 2014-05-06 DIAGNOSIS — F329 Major depressive disorder, single episode, unspecified: Secondary | ICD-10-CM

## 2014-05-06 DIAGNOSIS — I120 Hypertensive chronic kidney disease with stage 5 chronic kidney disease or end stage renal disease: Secondary | ICD-10-CM | POA: Insufficient documentation

## 2014-05-06 DIAGNOSIS — N186 End stage renal disease: Secondary | ICD-10-CM

## 2014-05-06 DIAGNOSIS — M542 Cervicalgia: Secondary | ICD-10-CM | POA: Insufficient documentation

## 2014-05-06 DIAGNOSIS — Z992 Dependence on renal dialysis: Secondary | ICD-10-CM

## 2014-05-06 LAB — CBC WITH DIFFERENTIAL/PLATELET
BASOS PCT: 0 % (ref 0–1)
Basophils Absolute: 0 10*3/uL (ref 0.0–0.1)
EOS ABS: 0.1 10*3/uL (ref 0.0–0.7)
EOS PCT: 2 % (ref 0–5)
HCT: 27.9 % — ABNORMAL LOW (ref 39.0–52.0)
Hemoglobin: 8.9 g/dL — ABNORMAL LOW (ref 13.0–17.0)
LYMPHS ABS: 1.5 10*3/uL (ref 0.7–4.0)
Lymphocytes Relative: 24 % (ref 12–46)
MCH: 23.1 pg — AB (ref 26.0–34.0)
MCHC: 31.9 g/dL (ref 30.0–36.0)
MCV: 72.3 fL — ABNORMAL LOW (ref 78.0–100.0)
Monocytes Absolute: 0.6 10*3/uL (ref 0.1–1.0)
Monocytes Relative: 10 % (ref 3–12)
Neutro Abs: 3.9 10*3/uL (ref 1.7–7.7)
Neutrophils Relative %: 64 % (ref 43–77)
PLATELETS: 151 10*3/uL (ref 150–400)
RBC: 3.86 MIL/uL — AB (ref 4.22–5.81)
RDW: 18 % — AB (ref 11.5–15.5)
WBC: 6.2 10*3/uL (ref 4.0–10.5)

## 2014-05-06 LAB — BASIC METABOLIC PANEL
Anion gap: 19 — ABNORMAL HIGH (ref 5–15)
BUN: 82 mg/dL — AB (ref 6–23)
CALCIUM: 8.6 mg/dL (ref 8.4–10.5)
CO2: 18 mEq/L — ABNORMAL LOW (ref 19–32)
Chloride: 100 mEq/L (ref 96–112)
Creatinine, Ser: 16.12 mg/dL — ABNORMAL HIGH (ref 0.50–1.35)
GFR, EST AFRICAN AMERICAN: 3 mL/min — AB (ref >90)
GFR, EST NON AFRICAN AMERICAN: 3 mL/min — AB (ref >90)
Glucose, Bld: 86 mg/dL (ref 70–99)
Potassium: 6 mEq/L — ABNORMAL HIGH (ref 3.7–5.3)
Sodium: 137 mEq/L (ref 137–147)

## 2014-05-06 MED ORDER — NAPROXEN SODIUM 275 MG PO TABS: 275.0000 mg | ORAL_TABLET | Freq: Two times a day (BID) | ORAL | Status: DC

## 2014-05-06 MED ORDER — CYCLOBENZAPRINE HCL 10 MG PO TABS
5.0000 mg | ORAL_TABLET | Freq: Once | ORAL | Status: AC
  Administered 2014-05-06: 5 mg via ORAL
  Filled 2014-05-06: qty 1

## 2014-05-06 MED ORDER — NAPROXEN 250 MG PO TABS
250.0000 mg | ORAL_TABLET | Freq: Two times a day (BID) | ORAL | Status: DC
  Administered 2014-05-06: 250 mg via ORAL
  Filled 2014-05-06: qty 1

## 2014-05-06 NOTE — Discharge Instructions (Signed)
You need to keep your dialysis appointment this morning. You need to see your primary care doctor about your neck pain. Take the medication I prescribed for you last night.

## 2014-05-06 NOTE — ED Provider Notes (Signed)
CSN: [id number]     Arrival date & time 05/06/14  0247 History   First MD Initiated Contact with Patient 05/06/14 0304     Chief Complaint  Patient presents with  . Shortness of Breath     (Consider location/radiation/quality/duration/timing/severity/associated sxs/prior Treatment) HPI Patient has end-stage disease and states he's supposed to have dialysis today at 11 AM. He states he goes to [facility]. Patient was seen by me when I was working at [employer] last night for pain in his neck. Patient states he's here for "same old same old". He states he has pain in his neck and his back, sore throat, difficulty swallowing. He had a CT of his neck that did not show any reason for his symptoms. He denies any change in activity or injury. He states he feels short of breath when he lays down and when he walks. He states he's been having swelling of his legs. He denies chest pain. History is difficult because patient keeps falling asleep while talking.  PCP Dr Kern Alberta  Past Medical History  Diagnosis Date  . Hypertension   . ESRD (end stage renal disease)     due to HTN per patient, followed at [facility], s/p failed kidney transplant  . Depression   . Complication of anesthesia     itching, sore throat  . Diabetes mellitus without complication     No history per patient, but remains under history as A1c would not be accurate given on dialysis  . Shortness of breath   . Anxiety    Past Surgical History  Procedure Laterality Date  . Kidney receipient  2006    failed and started HD in March 2014  . Capd insertion    . Capd removal     No family history on file. History  Substance Use Topics  . Smoking status: Former Smoker -- 1.00 packs/day for 1 years    Types: Cigarettes  . Smokeless tobacco: Never Used     Comment: quit Jan 2014  . Alcohol Use: No  lives alone  Review of Systems  All other systems reviewed and are negative.     Allergies  Ferrlecit  and Darvocet  Home Medications   Prior to Admission medications   Medication Sig Start Date End Date Taking? Authorizing Provider  acetaminophen (TYLENOL) 325 MG tablet Take 2 tablets (650 mg total) by mouth every 4 (four) hours as needed for mild pain, fever or headache. 04/06/14   Delfina Redwood, MD  allopurinol (ZYLOPRIM) 100 MG tablet Take 100 mg by mouth daily.    Historical Provider, MD  amLODipine (NORVASC) 5 MG tablet Take 10 mg by mouth daily.    Historical Provider, MD  cinacalcet (SENSIPAR) 30 MG tablet Take 30 mg by mouth daily.    Historical Provider, MD  cloNIDine (CATAPRES) 0.3 MG tablet Take 1 tablet (0.3 mg total) by mouth 3 (three) times daily. 04/06/14   Delfina Redwood, MD  cyclobenzaprine (FLEXERIL) 10 MG tablet Take 1 tablet (10 mg total) by mouth 3 (three) times daily as needed. 05/05/14   Janice Norrie, MD  diazepam (VALIUM) 5 MG tablet Take 1 tablet (5 mg total) by mouth 2 (two) times daily. 01/25/14   [name], PA-C  furosemide (LASIX) 80 MG tablet Take 80 mg by mouth daily.    Historical Provider, MD  hydrALAZINE (APRESOLINE) 25 MG tablet Take 50 mg by mouth 3 (three) times daily.    Historical  Provider, MD  isosorbide dinitrate (ISORDIL) 40 MG tablet Take 40 mg by mouth every 8 (eight) hours.    Historical Provider, MD  lisinopril (PRINIVIL,ZESTRIL) 20 MG tablet Take 20 mg by mouth daily.    Historical Provider, MD  minoxidil (LONITEN) 2.5 MG tablet Take 1 tablet (2.5 mg total) by mouth daily. 04/06/14   Delfina Redwood, MD  NIFEdipine (PROCARDIA-XL/ADALAT CC) 30 MG 24 hr tablet Take 1 tablet (30 mg total) by mouth daily. 04/06/14   Delfina Redwood, MD  omeprazole (PRILOSEC) 20 MG capsule Take 20 mg by mouth daily. 07/01/13   Historical Provider, MD  predniSONE (DELTASONE) 5 MG tablet Take 15 mg by mouth daily with breakfast.    Historical Provider, MD  sevelamer carbonate (RENVELA) 800 MG tablet Take 1,600 mg by mouth 3 (three) times daily with meals. 12/27/13  12/27/14  Historical Provider, MD  tacrolimus (PROGRAF) 5 MG capsule Take 10 mg by mouth 2 (two) times daily.     Historical Provider, MD  traMADol (ULTRAM) 50 MG tablet Take 2 tablets (100 mg total) by mouth every 6 (six) hours as needed. 05/05/14   Janice Norrie, MD   BP 159/112  Pulse 75  Temp(Src) 97.6 F (36.4 C) (Oral)  Resp 19  Ht _0  (1.88 m)  Wt 187 lb (84.823 kg)  BMI 24.00 kg/m2  SpO2 100%  Vital signs normal except diastolic hypertension  Physical Exam  Nursing note and vitals reviewed. Constitutional: He is oriented to person, place, and time. He appears well-developed and well-nourished.  Non-toxic appearance. He does not appear ill. No distress.  Patient falling asleep in mid sentence.  HENT:  Head: Normocephalic and atraumatic.  Right Ear: External ear normal.  Left Ear: External ear normal.  Nose: Nose normal. No mucosal edema or rhinorrhea.  Mouth/Throat: Oropharynx is clear and moist and mucous membranes are normal. No dental abscesses or uvula swelling.  Eyes: Conjunctivae and EOM are normal. Pupils are equal, round, and reactive to light.  Neck: Normal range of motion and full passive range of motion without pain. Neck supple.  Patient is moving his head much freer than when I saw him last night. He has some mild general diffuse tenderness of his posterior neck. He is moving his head spontaneously during the normal course of conversation.  Cardiovascular: Normal rate, regular rhythm and normal heart sounds.  Exam reveals no gallop and no friction rub.   No murmur heard. Pulmonary/Chest: Effort normal and breath sounds normal. No respiratory distress. He has no wheezes. He has no rhonchi. He has no rales. He exhibits no tenderness and no crepitus.  Abdominal: Soft. Normal appearance and bowel sounds are normal. He exhibits no distension. There is no tenderness. There is no rebound and no guarding.  Musculoskeletal: Normal range of motion. He exhibits no edema and no  tenderness.  Moves all extremities well.   Neurological: He is alert and oriented to person, place, and time. He has normal strength. No cranial nerve deficit.  Skin: Skin is warm, dry and intact. No rash noted. No erythema. No pallor.  Psychiatric: He has a normal mood and affect. His speech is normal and behavior is normal. His mood appears not anxious.    ED Course  Procedures (including critical care time)  Medications  naproxen (NAPROSYN) tablet 250 mg (250 mg Oral Given 05/06/14 0514)  cyclobenzaprine (FLEXERIL) tablet 5 mg (5 mg Oral Given 05/06/14 0514)     Patient has 26 ED visits  in 6 months with 4 admissions. A lot are for chest pain and shortness of breath.  Every time I entered the room patient is asleep. Patient states he has sleep apnea. He states they ordered him a new CPAP machine yesterday. Patient verifies he is supposed to have dialysis later this morning. His hyperkalemia was not treated because he did not have EKG changes and he is having dialysis in a few hours.  Labs Review Results for orders placed during the hospital encounter of 34/19/62  BASIC METABOLIC PANEL      Result Value Ref Range   Sodium 137  137 - 147 mEq/L   Potassium 6.0 (*) 3.7 - 5.3 mEq/L   Chloride 100  96 - 112 mEq/L   CO2 18 (*) 19 - 32 mEq/L   Glucose, Bld 86  70 - 99 mg/dL   BUN 82 (*) 6 - 23 mg/dL   Creatinine, Ser 16.12 (*) 0.50 - 1.35 mg/dL   Calcium 8.6  8.4 - 10.5 mg/dL   GFR calc non Af Amer 3 (*) >90 mL/min   GFR calc Af Amer 3 (*) >90 mL/min   Anion gap 19 (*) 5 - 15  CBC WITH DIFFERENTIAL      Result Value Ref Range   WBC 6.2  4.0 - 10.5 K/uL   RBC 3.86 (*) 4.22 - 5.81 MIL/uL   Hemoglobin 8.9 (*) 13.0 - 17.0 g/dL   HCT 27.9 (*) 39.0 - 52.0 %   MCV 72.3 (*) 78.0 - 100.0 fL   MCH 23.1 (*) 26.0 - 34.0 pg   MCHC 31.9  30.0 - 36.0 g/dL   RDW 18.0 (*) 11.5 - 15.5 %   Platelets 151  150 - 400 K/uL   Neutrophils Relative % 64  43 - 77 %   Neutro Abs 3.9  1.7 - 7.7 K/uL    Lymphocytes Relative 24  12 - 46 %   Lymphs Abs 1.5  0.7 - 4.0 K/uL   Monocytes Relative 10  3 - 12 %   Monocytes Absolute 0.6  0.1 - 1.0 K/uL   Eosinophils Relative 2  0 - 5 %   Eosinophils Absolute 0.1  0.0 - 0.7 K/uL   Basophils Relative 0  0 - 1 %   Basophils Absolute 0.0  0.0 - 0.1 K/uL   Laboratory interpretation all normal except stable anemia, hyperkalemia, CRF   Imaging Review  Dg Chest 2 View  05/06/2014   CLINICAL DATA:  Initial evaluation for shortness of breath. History of hypertension, end-stage renal disease.  EXAM: CHEST  2 VIEW  COMPARISON:  Prior radiograph from 04/15/2014  FINDINGS: Right-sided hemodialysis catheter is stable. Severe cardiomegaly is grossly unchanged. Mediastinal silhouette within normal limits. Tortuosity intrathoracic aorta noted.  Lungs are normally inflated. There is diffuse pulmonary vascular congestion without overt pulmonary edema. No pleural effusion. No focal infiltrate to suggest acute infectious pneumonitis. There is no pneumothorax.  No acute osseous abnormality.  IMPRESSION: Stable cardiomegaly with mild diffuse pulmonary vascular congestion without overt pulmonary edema.   Electronically Signed   By: Jeannine Boga M.D.   On: 05/06/2014 04:35      Ct Soft Tissue Neck W Contrast  05/05/2014   CLINICAL DATA:  Initial evaluation for acute sore throat, difficulty swallowing, difficulty breathing, neck stiffness.  .  IMPRESSION: 1. No acute abnormality identified within the neck. 2. Enlarged mediastinal lymph nodes measuring up to 1.6 cm in short axis. Findings are of uncertain etiology, and are  incompletely evaluated on this exam. No cervical adenopathy identified.   Electronically Signed   By: Jeannine Boga M.D.   On: 05/05/2014 05:41     EKG Interpretation   Date/Time:  Friday May 06 2014 02:53:46 EDT Ventricular Rate:  75 PR Interval:  168 QRS Duration: 84 QT Interval:  414 QTC Calculation: 462 R Axis:   -35 Text  Interpretation:  Normal sinus rhythm Possible Left atrial enlargement  Left axis deviation Left ventricular hypertrophy with repolarization  abnormality No significant change since last tracing 15 Apr 2014 Confirmed  by [facility]  MD-I, Tailer Volkert ([id number]) on 05/06/2014 2:57:56 AM      MDM   Final diagnoses:  Neck pain  Shortness of breath  ESRD needing dialysis    Plan discharge  Rolland Porter, MD, Alanson Aly, MD 05/06/14 [phone]

## 2014-05-06 NOTE — ED Notes (Signed)
Contacted lab per Dr.Knapp; no hemolysis noted in BMP per lab tech

## 2014-05-06 NOTE — ED Notes (Signed)
Pt c/o increased shortness of breath x 2 days. Reports having dialysis on Tuesday, although normal dialysis routine is MWF. Pt was unable to get to dialysis on Friday for unknown reasons. Completed full session on Tuesday to "double up." Pt very lethargic, unable to stay awake during assessment.  Bilateral eyes are jaundice. IV attempted x 2 by this RN; pt sleeping and jerked during IV insertion. Hx of sleep apnea; has not been able to use bi-pap at home. Also c/o neck pain when extending neck back; was seen here at 10/15 for same.

## 2014-05-06 NOTE — ED Notes (Signed)
Dr.Knapp at bedside

## 2014-05-06 NOTE — ED Notes (Signed)
Patient pressed call light; found patient had unhooked himself; and was pacing around room. Requesting to ambulate to restroom. Pt ambulated with steady gait to and from room

## 2014-05-06 NOTE — ED Notes (Signed)
Pt c/o sob x 2 days. Pt is a dialysis pt- access R upper chest. Last dialysis on Tuesday but is supposed to go MWF. Pt also c/o neck spasms. Pt has hx of sleep apnea but does not have cpap at this time.

## 2014-05-07 DIAGNOSIS — I2699 Other pulmonary embolism without acute cor pulmonale: Principal | ICD-10-CM | POA: Diagnosis present

## 2014-05-07 DIAGNOSIS — Z79899 Other long term (current) drug therapy: Secondary | ICD-10-CM

## 2014-05-07 DIAGNOSIS — Y830 Surgical operation with transplant of whole organ as the cause of abnormal reaction of the patient, or of later complication, without mention of misadventure at the time of the procedure: Secondary | ICD-10-CM | POA: Diagnosis present

## 2014-05-07 DIAGNOSIS — I120 Hypertensive chronic kidney disease with stage 5 chronic kidney disease or end stage renal disease: Secondary | ICD-10-CM | POA: Diagnosis present

## 2014-05-07 DIAGNOSIS — Z7952 Long term (current) use of systemic steroids: Secondary | ICD-10-CM

## 2014-05-07 DIAGNOSIS — E119 Type 2 diabetes mellitus without complications: Secondary | ICD-10-CM | POA: Diagnosis present

## 2014-05-07 DIAGNOSIS — D631 Anemia in chronic kidney disease: Secondary | ICD-10-CM | POA: Diagnosis present

## 2014-05-07 DIAGNOSIS — E875 Hyperkalemia: Secondary | ICD-10-CM | POA: Diagnosis not present

## 2014-05-07 DIAGNOSIS — T8612 Kidney transplant failure: Secondary | ICD-10-CM | POA: Diagnosis present

## 2014-05-07 DIAGNOSIS — Z992 Dependence on renal dialysis: Secondary | ICD-10-CM

## 2014-05-07 DIAGNOSIS — N186 End stage renal disease: Secondary | ICD-10-CM | POA: Diagnosis present

## 2014-05-07 DIAGNOSIS — N2581 Secondary hyperparathyroidism of renal origin: Secondary | ICD-10-CM | POA: Diagnosis present

## 2014-05-07 DIAGNOSIS — F411 Generalized anxiety disorder: Secondary | ICD-10-CM | POA: Diagnosis present

## 2014-05-07 DIAGNOSIS — Z87891 Personal history of nicotine dependence: Secondary | ICD-10-CM

## 2014-05-07 LAB — CULTURE, GROUP A STREP

## 2014-05-08 ENCOUNTER — Inpatient Hospital Stay (HOSPITAL_COMMUNITY)
Admission: EM | Admit: 2014-05-08 | Discharge: 2014-05-13 | DRG: 175 | Disposition: A | Discharge status: Home / Self Care | Payer: Medicare Other | Attending: Internal Medicine | Admitting: Internal Medicine

## 2014-05-08 ENCOUNTER — Emergency Department (HOSPITAL_COMMUNITY): Payer: Medicare Other | Attending: Emergency Medicine

## 2014-05-08 ENCOUNTER — Encounter (HOSPITAL_COMMUNITY): Payer: Self-pay | Admitting: Emergency Medicine

## 2014-05-08 ENCOUNTER — Emergency Department (HOSPITAL_COMMUNITY): Payer: Medicare Other

## 2014-05-08 DIAGNOSIS — Z7952 Long term (current) use of systemic steroids: Secondary | ICD-10-CM | POA: Diagnosis not present

## 2014-05-08 DIAGNOSIS — Z87891 Personal history of nicotine dependence: Secondary | ICD-10-CM | POA: Diagnosis not present

## 2014-05-08 DIAGNOSIS — Z79899 Other long term (current) drug therapy: Secondary | ICD-10-CM | POA: Diagnosis not present

## 2014-05-08 DIAGNOSIS — I120 Hypertensive chronic kidney disease with stage 5 chronic kidney disease or end stage renal disease: Secondary | ICD-10-CM | POA: Diagnosis present

## 2014-05-08 DIAGNOSIS — R0602 Shortness of breath: Secondary | ICD-10-CM | POA: Diagnosis present

## 2014-05-08 DIAGNOSIS — T8612 Kidney transplant failure: Secondary | ICD-10-CM | POA: Diagnosis present

## 2014-05-08 DIAGNOSIS — N186 End stage renal disease: Secondary | ICD-10-CM | POA: Diagnosis present

## 2014-05-08 DIAGNOSIS — E0859 Diabetes mellitus due to underlying condition with other circulatory complications: Secondary | ICD-10-CM

## 2014-05-08 DIAGNOSIS — E119 Type 2 diabetes mellitus without complications: Secondary | ICD-10-CM | POA: Diagnosis present

## 2014-05-08 DIAGNOSIS — Z992 Dependence on renal dialysis: Secondary | ICD-10-CM | POA: Diagnosis not present

## 2014-05-08 DIAGNOSIS — I2699 Other pulmonary embolism without acute cor pulmonale: Principal | ICD-10-CM

## 2014-05-08 DIAGNOSIS — N2581 Secondary hyperparathyroidism of renal origin: Secondary | ICD-10-CM | POA: Diagnosis present

## 2014-05-08 DIAGNOSIS — N189 Chronic kidney disease, unspecified: Secondary | ICD-10-CM

## 2014-05-08 DIAGNOSIS — F411 Generalized anxiety disorder: Secondary | ICD-10-CM | POA: Diagnosis present

## 2014-05-08 DIAGNOSIS — Y830 Surgical operation with transplant of whole organ as the cause of abnormal reaction of the patient, or of later complication, without mention of misadventure at the time of the procedure: Secondary | ICD-10-CM | POA: Diagnosis present

## 2014-05-08 DIAGNOSIS — Z86711 Personal history of pulmonary embolism: Secondary | ICD-10-CM | POA: Diagnosis present

## 2014-05-08 DIAGNOSIS — I160 Hypertensive urgency: Secondary | ICD-10-CM

## 2014-05-08 DIAGNOSIS — D631 Anemia in chronic kidney disease: Secondary | ICD-10-CM

## 2014-05-08 DIAGNOSIS — E875 Hyperkalemia: Secondary | ICD-10-CM | POA: Diagnosis not present

## 2014-05-08 DIAGNOSIS — N179 Acute kidney failure, unspecified: Secondary | ICD-10-CM

## 2014-05-08 DIAGNOSIS — J9601 Acute respiratory failure with hypoxia: Secondary | ICD-10-CM

## 2014-05-08 DIAGNOSIS — R0781 Pleurodynia: Secondary | ICD-10-CM

## 2014-05-08 DIAGNOSIS — I10 Essential (primary) hypertension: Secondary | ICD-10-CM

## 2014-05-08 DIAGNOSIS — R1012 Left upper quadrant pain: Secondary | ICD-10-CM

## 2014-05-08 LAB — MRSA PCR SCREENING: MRSA BY PCR: NEGATIVE

## 2014-05-08 LAB — BASIC METABOLIC PANEL
Anion gap: 19 — ABNORMAL HIGH (ref 5–15)
BUN: 72 mg/dL — ABNORMAL HIGH (ref 6–23)
CO2: 21 meq/L (ref 19–32)
Calcium: 8.3 mg/dL — ABNORMAL LOW (ref 8.4–10.5)
Chloride: 100 mEq/L (ref 96–112)
Creatinine, Ser: 14.52 mg/dL — ABNORMAL HIGH (ref 0.50–1.35)
GFR calc Af Amer: 4 mL/min — ABNORMAL LOW (ref >90)
GFR calc non Af Amer: 3 mL/min — ABNORMAL LOW (ref >90)
Glucose, Bld: 100 mg/dL — ABNORMAL HIGH (ref 70–99)
POTASSIUM: 4.8 meq/L (ref 3.7–5.3)
SODIUM: 140 meq/L (ref 137–147)

## 2014-05-08 LAB — CBC WITH DIFFERENTIAL/PLATELET
Basophils Absolute: 0 10*3/uL (ref 0.0–0.1)
Basophils Relative: 0 % (ref 0–1)
EOS ABS: 0.1 10*3/uL (ref 0.0–0.7)
Eosinophils Relative: 2 % (ref 0–5)
HCT: 29 % — ABNORMAL LOW (ref 39.0–52.0)
HEMOGLOBIN: 9.1 g/dL — AB (ref 13.0–17.0)
LYMPHS ABS: 1.3 10*3/uL (ref 0.7–4.0)
LYMPHS PCT: 23 % (ref 12–46)
MCH: 23.3 pg — AB (ref 26.0–34.0)
MCHC: 31.4 g/dL (ref 30.0–36.0)
MCV: 74.2 fL — AB (ref 78.0–100.0)
MONO ABS: 0.6 10*3/uL (ref 0.1–1.0)
MONOS PCT: 11 % (ref 3–12)
Neutro Abs: 3.5 10*3/uL (ref 1.7–7.7)
Neutrophils Relative %: 64 % (ref 43–77)
Platelets: 142 10*3/uL — ABNORMAL LOW (ref 150–400)
RBC: 3.91 MIL/uL — ABNORMAL LOW (ref 4.22–5.81)
RDW: 18.2 % — ABNORMAL HIGH (ref 11.5–15.5)
WBC: 5.6 10*3/uL (ref 4.0–10.5)

## 2014-05-08 LAB — TROPONIN I

## 2014-05-08 LAB — PROTIME-INR
INR: 1.3 (ref 0.00–1.49)
Prothrombin Time: 16.3 seconds — ABNORMAL HIGH (ref 11.6–15.2)

## 2014-05-08 MED ORDER — PANTOPRAZOLE SODIUM 40 MG PO TBEC
40.0000 mg | DELAYED_RELEASE_TABLET | Freq: Every day | ORAL | Status: DC
  Administered 2014-05-08 – 2014-05-13 (×6): 40 mg via ORAL
  Filled 2014-05-08 (×6): qty 1

## 2014-05-08 MED ORDER — PATIENT'S GUIDE TO USING COUMADIN BOOK
Freq: Once | Status: AC
  Administered 2014-05-08: 1
  Filled 2014-05-08 (×2): qty 1

## 2014-05-08 MED ORDER — LABETALOL HCL 5 MG/ML IV SOLN
20.0000 mg | INTRAVENOUS | Status: DC | PRN
  Administered 2014-05-08 (×4): 20 mg via INTRAVENOUS
  Filled 2014-05-08 (×4): qty 4

## 2014-05-08 MED ORDER — ENOXAPARIN SODIUM 80 MG/0.8ML ~~LOC~~ SOLN
1.0000 mg/kg | SUBCUTANEOUS | Status: DC
  Administered 2014-05-09 – 2014-05-12 (×4): 80 mg via SUBCUTANEOUS
  Filled 2014-05-08 (×5): qty 0.8

## 2014-05-08 MED ORDER — WARFARIN VIDEO
Freq: Once | Status: AC
  Administered 2014-05-08: 20:00:00

## 2014-05-08 MED ORDER — HYDRALAZINE HCL 20 MG/ML IJ SOLN
10.0000 mg | INTRAMUSCULAR | Status: DC | PRN
  Administered 2014-05-08 – 2014-05-09 (×4): 10 mg via INTRAVENOUS
  Filled 2014-05-08 (×4): qty 1

## 2014-05-08 MED ORDER — SODIUM CHLORIDE 0.9 % IJ SOLN: 3.0000 mL | INTRAMUSCULAR | Status: DC | PRN

## 2014-05-08 MED ORDER — IOHEXOL 350 MG/ML SOLN
100.0000 mL | Freq: Once | INTRAVENOUS | Status: AC | PRN
  Administered 2014-05-08: 100 mL via INTRAVENOUS

## 2014-05-08 MED ORDER — TACROLIMUS 1 MG PO CAPS
10.0000 mg | ORAL_CAPSULE | Freq: Two times a day (BID) | ORAL | Status: DC
  Administered 2014-05-08 – 2014-05-09 (×3): 5 mg via ORAL
  Filled 2014-05-08 (×5): qty 10

## 2014-05-08 MED ORDER — ONDANSETRON HCL 4 MG/2ML IJ SOLN
4.0000 mg | Freq: Once | INTRAMUSCULAR | Status: AC
  Administered 2014-05-08: 4 mg via INTRAVENOUS
  Filled 2014-05-08: qty 2

## 2014-05-08 MED ORDER — DIAZEPAM 5 MG PO TABS
5.0000 mg | ORAL_TABLET | Freq: Two times a day (BID) | ORAL | Status: DC
  Administered 2014-05-08 – 2014-05-09 (×3): 5 mg via ORAL
  Filled 2014-05-08 (×3): qty 1

## 2014-05-08 MED ORDER — NIFEDIPINE ER 30 MG PO TB24
30.0000 mg | ORAL_TABLET | Freq: Every day | ORAL | Status: DC
  Administered 2014-05-08 – 2014-05-09 (×2): 30 mg via ORAL
  Filled 2014-05-08 (×2): qty 1

## 2014-05-08 MED ORDER — SODIUM CHLORIDE 0.9 % IJ SOLN
3.0000 mL | Freq: Two times a day (BID) | INTRAMUSCULAR | Status: DC
  Administered 2014-05-08 – 2014-05-12 (×8): 3 mL via INTRAVENOUS
  Filled 2014-05-08: qty 3

## 2014-05-08 MED ORDER — AMLODIPINE BESYLATE 10 MG PO TABS
10.0000 mg | ORAL_TABLET | Freq: Every day | ORAL | Status: DC
  Administered 2014-05-08 – 2014-05-11 (×4): 10 mg via ORAL
  Filled 2014-05-08 (×2): qty 1
  Filled 2014-05-08: qty 2
  Filled 2014-05-08: qty 1

## 2014-05-08 MED ORDER — WARFARIN - PHARMACIST DOSING INPATIENT: Freq: Every day | Status: DC

## 2014-05-08 MED ORDER — ACETAMINOPHEN 325 MG PO TABS
650.0000 mg | ORAL_TABLET | Freq: Four times a day (QID) | ORAL | Status: DC | PRN
  Administered 2014-05-11: 650 mg via ORAL
  Filled 2014-05-08: qty 2

## 2014-05-08 MED ORDER — ONDANSETRON HCL 4 MG PO TABS
4.0000 mg | ORAL_TABLET | Freq: Four times a day (QID) | ORAL | Status: DC | PRN
  Administered 2014-05-13: 4 mg via ORAL
  Filled 2014-05-08: qty 1

## 2014-05-08 MED ORDER — ZOLPIDEM TARTRATE 5 MG PO TABS
5.0000 mg | ORAL_TABLET | Freq: Every evening | ORAL | Status: DC | PRN
  Administered 2014-05-08 – 2014-05-10 (×3): 5 mg via ORAL
  Filled 2014-05-08 (×3): qty 1

## 2014-05-08 MED ORDER — CLONIDINE HCL 0.3 MG PO TABS
0.3000 mg | ORAL_TABLET | Freq: Three times a day (TID) | ORAL | Status: DC
  Administered 2014-05-08 – 2014-05-13 (×13): 0.3 mg via ORAL
  Filled 2014-05-08 (×18): qty 1

## 2014-05-08 MED ORDER — HYDRALAZINE HCL 25 MG PO TABS
25.0000 mg | ORAL_TABLET | Freq: Four times a day (QID) | ORAL | Status: DC
  Administered 2014-05-08 – 2014-05-09 (×5): 25 mg via ORAL
  Filled 2014-05-08 (×9): qty 1

## 2014-05-08 MED ORDER — WARFARIN SODIUM 10 MG PO TABS
10.0000 mg | ORAL_TABLET | Freq: Once | ORAL | Status: AC
  Administered 2014-05-08: 10 mg via ORAL
  Filled 2014-05-08 (×2): qty 1

## 2014-05-08 MED ORDER — SODIUM CHLORIDE 0.9 % IV SOLN: 250.0000 mL | INTRAVENOUS | Status: DC | PRN

## 2014-05-08 MED ORDER — ASPIRIN 81 MG PO CHEW
324.0000 mg | CHEWABLE_TABLET | Freq: Once | ORAL | Status: AC
  Administered 2014-05-08: 324 mg via ORAL
  Filled 2014-05-08: qty 4

## 2014-05-08 MED ORDER — HYDROMORPHONE HCL 1 MG/ML IJ SOLN: 0.5000 mg | INTRAMUSCULAR | Status: DC | PRN

## 2014-05-08 MED ORDER — ONDANSETRON HCL 4 MG/2ML IJ SOLN
4.0000 mg | Freq: Four times a day (QID) | INTRAMUSCULAR | Status: DC | PRN
  Administered 2014-05-12: 4 mg via INTRAVENOUS

## 2014-05-08 MED ORDER — ISOSORBIDE DINITRATE 20 MG PO TABS
40.0000 mg | ORAL_TABLET | Freq: Three times a day (TID) | ORAL | Status: DC
  Administered 2014-05-08 – 2014-05-13 (×15): 40 mg via ORAL
  Filled 2014-05-08 (×22): qty 2

## 2014-05-08 MED ORDER — MINOXIDIL 2.5 MG PO TABS
2.5000 mg | ORAL_TABLET | Freq: Every day | ORAL | Status: DC
  Administered 2014-05-08 – 2014-05-13 (×6): 2.5 mg via ORAL
  Filled 2014-05-08 (×6): qty 1

## 2014-05-08 MED ORDER — CINACALCET HCL 30 MG PO TABS
30.0000 mg | ORAL_TABLET | Freq: Every day | ORAL | Status: DC
  Administered 2014-05-08 – 2014-05-13 (×5): 30 mg via ORAL
  Filled 2014-05-08 (×8): qty 1

## 2014-05-08 MED ORDER — OXYCODONE HCL 5 MG PO TABS
5.0000 mg | ORAL_TABLET | ORAL | Status: DC | PRN
  Administered 2014-05-12 (×3): 5 mg via ORAL
  Filled 2014-05-08 (×2): qty 1

## 2014-05-08 MED ORDER — ENOXAPARIN SODIUM 80 MG/0.8ML ~~LOC~~ SOLN
1.0000 mg/kg | Freq: Once | SUBCUTANEOUS | Status: AC
  Administered 2014-05-08: 80 mg via SUBCUTANEOUS
  Filled 2014-05-08: qty 0.8

## 2014-05-08 MED ORDER — ALLOPURINOL 100 MG PO TABS
100.0000 mg | ORAL_TABLET | Freq: Every day | ORAL | Status: DC
  Administered 2014-05-09 – 2014-05-10 (×2): 100 mg via ORAL
  Filled 2014-05-08 (×6): qty 1

## 2014-05-08 MED ORDER — LISINOPRIL 20 MG PO TABS
20.0000 mg | ORAL_TABLET | Freq: Every day | ORAL | Status: DC
  Administered 2014-05-08 – 2014-05-13 (×6): 20 mg via ORAL
  Filled 2014-05-08 (×6): qty 1

## 2014-05-08 MED ORDER — SEVELAMER CARBONATE 800 MG PO TABS
1600.0000 mg | ORAL_TABLET | Freq: Three times a day (TID) | ORAL | Status: DC
  Administered 2014-05-08 – 2014-05-13 (×12): 1600 mg via ORAL
  Filled 2014-05-08 (×19): qty 2

## 2014-05-08 MED ORDER — NITROGLYCERIN 0.4 MG SL SUBL: 0.4000 mg | SUBLINGUAL_TABLET | SUBLINGUAL | Status: DC | PRN

## 2014-05-08 MED ORDER — PREDNISONE 5 MG PO TABS
15.0000 mg | ORAL_TABLET | Freq: Every day | ORAL | Status: DC
  Administered 2014-05-08 – 2014-05-09 (×2): 15 mg via ORAL
  Filled 2014-05-08 (×3): qty 1

## 2014-05-08 MED ORDER — ENOXAPARIN SODIUM 80 MG/0.8ML ~~LOC~~ SOLN
1.0000 mg/kg | Freq: Two times a day (BID) | SUBCUTANEOUS | Status: DC
  Filled 2014-05-08 (×2): qty 0.8

## 2014-05-08 MED ORDER — ACETAMINOPHEN 650 MG RE SUPP: 650.0000 mg | Freq: Four times a day (QID) | RECTAL | Status: DC | PRN

## 2014-05-08 MED ORDER — FUROSEMIDE 80 MG PO TABS
80.0000 mg | ORAL_TABLET | Freq: Every day | ORAL | Status: DC
  Administered 2014-05-08 – 2014-05-09 (×2): 80 mg via ORAL
  Filled 2014-05-08: qty 4
  Filled 2014-05-08: qty 1

## 2014-05-08 NOTE — ED Notes (Signed)
Patient states he gets dialysis on Saturday, Tuesday and Thursday.  Pt states the pain in the back started 2 days ago (Friday) and the chest pain started today.

## 2014-05-08 NOTE — ED Notes (Signed)
Pt given a warm blanket

## 2014-05-08 NOTE — ED Notes (Signed)
Attempted to call report. Janett Billow, Agricultural consultant, aware of delay.

## 2014-05-08 NOTE — ED Provider Notes (Addendum)
CSN: [id number]     Arrival date & time 05/07/14  2358 History   First MD Initiated Contact with Patient 05/08/14 0147     Chief Complaint  Patient presents with  . Shortness of Breath  . Back Pain  . Abdominal Pain  . Emesis  . Diarrhea     (Consider location/radiation/quality/duration/timing/severity/associated sxs/prior Treatment) Patient is a 48 y.o. male presenting with shortness of breath, back pain, abdominal pain, vomiting, and diarrhea. The history is provided by the patient.  Shortness of Breath Associated symptoms: abdominal pain and vomiting   Back Pain Associated symptoms: abdominal pain   Abdominal Pain Associated symptoms: diarrhea, shortness of breath and vomiting   Emesis Associated symptoms: abdominal pain and diarrhea   Diarrhea Associated symptoms: abdominal pain and vomiting   He has been having pain in the upper chest with radiation to the back and also to the epigastric area. Pain started yesterday and got worse today. It is worse when he takes a breath. He describes it as a pressure sensation. There has been associated with nausea and vomiting but no diarrhea. He denies fever or chills or cough. He rates pain 8/10. Nothing seems to make it any better. He is a dialysis patient did have dialysis today but was only able to complete 3 hours and is scheduled for our session. He has not taken anything for his pain. He states he's been compliant with his blood pressure medications. Of note, he is status post renal transplant but transplant failed 2 years ago.  Past Medical History  Diagnosis Date  . Hypertension   . ESRD (end stage renal disease)     due to HTN per patient, followed at [facility] s/p failed kidney transplant  . Depression   . Complication of anesthesia     itching, sore throat  . Diabetes mellitus without complication     No history per patient, but remains under history as A1c would not be accurate given on dialysis  . Shortness of breath   .  Anxiety    Past Surgical History  Procedure Laterality Date  . Kidney receipient  2006    failed and started HD in March 2014  . Capd insertion    . Capd removal     No family history on file. History  Substance Use Topics  . Smoking status: Former Smoker -- 1.00 packs/day for 1 years    Types: Cigarettes  . Smokeless tobacco: Never Used     Comment: quit Jan 2014  . Alcohol Use: No    Review of Systems  Respiratory: Positive for shortness of breath.   Gastrointestinal: Positive for vomiting, abdominal pain and diarrhea.  Musculoskeletal: Positive for back pain.  All other systems reviewed and are negative.     Allergies  Ferrlecit and Darvocet  Home Medications   Prior to Admission medications   Medication Sig Start Date End Date Taking? Authorizing Provider  acetaminophen (TYLENOL) 325 MG tablet Take 2 tablets (650 mg total) by mouth every 4 (four) hours as needed for mild pain, fever or headache. 04/06/14  Yes Delfina Redwood, MD  allopurinol (ZYLOPRIM) 100 MG tablet Take 100 mg by mouth daily.   Yes Historical Provider, MD  amLODipine (NORVASC) 5 MG tablet Take 10 mg by mouth daily.   Yes Historical Provider, MD  cinacalcet (SENSIPAR) 30 MG tablet Take 30 mg by mouth daily.   Yes Historical Provider, MD  cloNIDine (CATAPRES) 0.3 MG tablet Take 1 tablet (0.3 mg total)  by mouth 3 (three) times daily. 04/06/14  Yes Delfina Redwood, MD  diazepam (VALIUM) 5 MG tablet Take 1 tablet (5 mg total) by mouth 2 (two) times daily. 01/25/14  Yes [name], PA-C  furosemide (LASIX) 80 MG tablet Take 80 mg by mouth daily.   Yes Historical Provider, MD  hydrALAZINE (APRESOLINE) 25 MG tablet Take 50 mg by mouth 3 (three) times daily.   Yes Historical Provider, MD  isosorbide dinitrate (ISORDIL) 40 MG tablet Take 40 mg by mouth every 8 (eight) hours.   Yes Historical Provider, MD  lisinopril (PRINIVIL,ZESTRIL) 20 MG tablet Take 20 mg by mouth daily.   Yes Historical Provider, MD   minoxidil (LONITEN) 2.5 MG tablet Take 1 tablet (2.5 mg total) by mouth daily. 04/06/14  Yes Delfina Redwood, MD  NIFEdipine (PROCARDIA-XL/ADALAT CC) 30 MG 24 hr tablet Take 1 tablet (30 mg total) by mouth daily. 04/06/14  Yes Delfina Redwood, MD  omeprazole (PRILOSEC) 20 MG capsule Take 20 mg by mouth daily. 07/01/13  Yes Historical Provider, MD  predniSONE (DELTASONE) 5 MG tablet Take 15 mg by mouth daily with breakfast.   Yes Historical Provider, MD  sevelamer carbonate (RENVELA) 800 MG tablet Take 1,600 mg by mouth 3 (three) times daily with meals. 12/27/13 12/27/14 Yes Historical Provider, MD  tacrolimus (PROGRAF) 5 MG capsule Take 10 mg by mouth 2 (two) times daily.    Yes Historical Provider, MD  cyclobenzaprine (FLEXERIL) 10 MG tablet Take 1 tablet (10 mg total) by mouth 3 (three) times daily as needed. 05/05/14   Janice Norrie, MD  traMADol (ULTRAM) 50 MG tablet Take 2 tablets (100 mg total) by mouth every 6 (six) hours as needed. 05/05/14   Janice Norrie, MD   BP 181/132  Pulse 98  Temp(Src) 97.8 F (36.6 C) (Oral)  Resp 18  Wt 177 lb (80.287 kg)  SpO2 99% Physical Exam  Nursing note and vitals reviewed.  48 year old male, resting comfortably and in no acute distress. Vital signs are significant for severe hypertension. Oxygen saturation is 99%, which is normal. Head is normocephalic and atraumatic. PERRLA, EOMI. Oropharynx is clear. Neck is nontender and supple without adenopathy or JVD. Back is nontender and there is no CVA tenderness. Lungs are clear without rales, wheezes, or rhonchi. Chest is nontender. Dialysis access catheter is present in the right subclavian area. Heart has regular rate and rhythm without murmur. Abdomen is soft, flat, with moderate epigastric tenderness. There is no rebound or guarding. There are no masses or hepatosplenomegaly and peristalsis is hypoactive. Extremities have no cyanosis or edema, full range of motion is present. Skin is warm and dry  without rash. Neurologic: Mental status is normal, cranial nerves are intact, there are no motor or sensory deficits.  ED Course  Procedures (including critical care time) Labs Review Results for orders placed during the hospital encounter of 82/50/53  BASIC METABOLIC PANEL      Result Value Ref Range   Sodium 140  137 - 147 mEq/L   Potassium 4.8  3.7 - 5.3 mEq/L   Chloride 100  96 - 112 mEq/L   CO2 21  19 - 32 mEq/L   Glucose, Bld 100 (*) 70 - 99 mg/dL   BUN 72 (*) 6 - 23 mg/dL   Creatinine, Ser 14.52 (*) 0.50 - 1.35 mg/dL   Calcium 8.3 (*) 8.4 - 10.5 mg/dL   GFR calc non Af Amer 3 (*) >90 mL/min  GFR calc Af Amer 4 (*) >90 mL/min   Anion gap 19 (*) 5 - 15  CBC WITH DIFFERENTIAL      Result Value Ref Range   WBC 5.6  4.0 - 10.5 K/uL   RBC 3.91 (*) 4.22 - 5.81 MIL/uL   Hemoglobin 9.1 (*) 13.0 - 17.0 g/dL   HCT 29.0 (*) 39.0 - 52.0 %   MCV 74.2 (*) 78.0 - 100.0 fL   MCH 23.3 (*) 26.0 - 34.0 pg   MCHC 31.4  30.0 - 36.0 g/dL   RDW 18.2 (*) 11.5 - 15.5 %   Platelets 142 (*) 150 - 400 K/uL   Neutrophils Relative % 64  43 - 77 %   Neutro Abs 3.5  1.7 - 7.7 K/uL   Lymphocytes Relative 23  12 - 46 %   Lymphs Abs 1.3  0.7 - 4.0 K/uL   Monocytes Relative 11  3 - 12 %   Monocytes Absolute 0.6  0.1 - 1.0 K/uL   Eosinophils Relative 2  0 - 5 %   Eosinophils Absolute 0.1  0.0 - 0.7 K/uL   Basophils Relative 0  0 - 1 %   Basophils Absolute 0.0  0.0 - 0.1 K/uL  TROPONIN I      Result Value Ref Range   Troponin I <0.30  <0.30 ng/mL   Imaging Review Dg Chest 2 View  05/08/2014   CLINICAL DATA:  Shortness of breath.  Initial encounter.  EXAM: CHEST  2 VIEW  COMPARISON:  05/06/2014.  FINDINGS: There is moderate cardiomegaly. Aortic contours are unremarkable and stable.  There is no edema, consolidation, effusion, or pneumothorax.  Stable positioning of dialysis catheter via right IJ approach. The tip is at the upper right atrium.  IMPRESSION: Cardiomegaly without failure.    Electronically Signed   By: Jorje Guild M.D.   On: 05/08/2014 00:59   Ct Angio Chest Pe W/cm &/or Wo Cm  05/08/2014   CLINICAL DATA:  Right-sided chest pain for 2 days. Initial encounter  EXAM: CT ANGIOGRAPHY CHEST WITH CONTRAST  TECHNIQUE: Multidetector CT imaging of the chest was performed using the standard protocol during bolus administration of intravenous contrast. Multiplanar CT image reconstructions and MIPs were obtained to evaluate the vascular anatomy.  CONTRAST:  155m OMNIPAQUE IOHEXOL 350 MG/ML SOLN  COMPARISON:  None.  FINDINGS: THORACIC INLET/BODY WALL:  Dialysis catheter via right IJ approach. Tip is in the upper right atrium. Low-attenuation along the catheter is discrete, suggestive of fibrin sheath.  MEDIASTINUM:  Acute subsegmental pulmonary embolism to the left upper lobe, apical segment. No definite additional pulmonary embolism is identified. There is poor enhancement of pulmonary arteries in the anterior basal segment left lower lobe and in the lingula, but these contact the left heart and are affected by motion.  Multi chamber cardiomegaly. There is likely a small pleural effusion, although this difficult to differentiate pleural fluid from myocardium due to the contrast timing. No definitive evidence of hemopericardium in this dialysis patient. There is limited opacification of the systemic arterial tree. No acute aortic findings present.  LUNG WINDOWS:  There is mild diffuse bronchial wall thickening. No edema or pneumonia. No effusion or pneumothorax. Mild atelectasis around the left heart.  UPPER ABDOMEN:  No acute findings.  Advanced bilateral renal atrophy.  OSSEOUS:  No acute fracture.  No suspicious lytic or blastic lesions.  Critical Value/emergent results were called by telephone at the time of interpretation on 05/08/2014 at 4:35 am to Dr.  Yuniel Blaney [name] , who verbally acknowledged these results.  Review of the MIP images confirms the above findings.  IMPRESSION: 1. Acute  subsegmental pulmonary embolism to the left upper lobe. No definite additional embolism, as above. 2. Right IJ dialysis catheter complicated by fibrin sheath.   Electronically Signed   By: Jorje Guild M.D.   On: 05/08/2014 04:37   Images viewed by me, discussed with radiologist.   EKG Interpretation   Date/Time:  Sunday May 08 2014 00:12:43 EDT Ventricular Rate:  92 PR Interval:  166 QRS Duration: 82 QT Interval:  378 QTC Calculation: 467 R Axis:   -33 Text Interpretation:  Normal sinus rhythm Left axis deviation Left  ventricular hypertrophy with repolarization abnormality Cannot rule out  Septal infarct , age undetermined Abnormal ECG When compared with ECG of  04/06/2014, No significant change was found Confirmed by [facility]  MD, Winnona Wargo  ([id number]) on 05/08/2014 2:05:01 AM      CRITICAL CARE Performed by: MCNOB,SJGGE Total critical care time: 75 minutes Critical care time was exclusive of separately billable procedures and treating other patients. Critical care was necessary to treat or prevent imminent or life-threatening deterioration. Critical care was time spent personally by me on the following activities: development of treatment plan with patient and/or surrogate as well as nursing, discussions with consultants, evaluation of patient's response to treatment, examination of patient, obtaining history from patient or surrogate, ordering and performing treatments and interventions, ordering and review of laboratory studies, ordering and review of radiographic studies, pulse oximetry and re-evaluation of patient's condition.  MDM   Final diagnoses:  Pulmonary embolism  Hypertensive urgency  End stage renal disease on dialysis    Chest pain in setting of severe hypertension. I'm concerned this represents a hypertensive urgency. ECG does not show any acute changes. I am also concerned about possibility of pulmonary embolism and aortic dissection. Blood pressure is taken in  both arms and is not significantly different (187/138 on the right, 185 or 135 on the left). He is started on labetalol for blood pressure control and was given aspirin and will be sent for CT of the chest to rule out pulmonary embolism. He is also given nitroglycerin.  Blood pressure was eventually controlled with 80 mg of labetalol. CT scan showed pulmonary embolism with probable source being his dialysis catheter. He started on anticoagulation with Lovenox apparent. Case is discussed with Dr. Arnoldo Morale of time hospitalist who agrees to admit him to the hospital. He was admitted to step down unit.  Delora Fuel, MD 36/62/94 [id number]  Kyle Luppino, MD 65/03/54 [id number]

## 2014-05-08 NOTE — Progress Notes (Signed)
ANTICOAGULATION CONSULT NOTE - Initial Consult  Pharmacy Consult for LMWH and coumadin Indication: pulmonary embolus  Allergies  Allergen Reactions  . Ferrlecit [Na Ferric Gluc Cplx In Sucrose] Shortness Of Breath, Swelling and Other (See Comments)    Swelling in throat  . Darvocet [Propoxyphene N-Acetaminophen] Hives    Patient Measurements: Weight: 177 lb (80.287 kg)   Vital Signs: Temp: 98.5 F (36.9 C) (10/18 1440) Temp Source: Oral (10/18 1440) BP: 166/105 mmHg (10/18 1440) Pulse Rate: 87 (10/18 1100)  Labs:  Recent Labs  05/06/14 0323 05/08/14 0015 05/08/14 0212  HGB 8.9* 9.1*  --   HCT 27.9* 29.0*  --   PLT 151 142*  --   CREATININE 16.12* 14.52*  --   TROPONINI  --   --  <0.30    The CrCl is unknown because both a height and weight (above a minimum accepted value) are required for this calculation.   Medical History: Past Medical History  Diagnosis Date  . Hypertension   . Depression   . Complication of anesthesia     itching, sore throat  . Diabetes mellitus without complication     No history per patient, but remains under history as A1c would not be accurate given on dialysis  . Shortness of breath   . Anxiety   . ESRD (end stage renal disease)     due to HTN per patient, followed at [facility], s/p failed kidney transplant - dialysis Tue, Th, Sat     Assessment: 48 yo AA man to start LMWH and coumadin per pharmacy for LUL PE.  PMH significant for ESRD on HD TTS.  Coumadin score = 10.   Wt 80.3 kg,  Hg 9.1, plct 142;   He was given LMWH 80 mg in ED at 0454 am.   Goal of Therapy:  INR 2-3 Anti-Xa level 0.6-1 units/ml 4hrs after LMWH dose given Monitor platelets by anticoagulation protocol: Yes   Plan:  Lovenox  1 mg/kg q24 = 80 mg sq q24 Coumadin 10 mg po x 1 dose today Daily INR, including baseline INR to be drawn now Will need a minimum of 5 days overlap of coumadin and LMWH for PE treatment. Coumadin book and video ordered for  education  Eudelia Bunch, Pharm.D. [phone] 05/08/2014 3:03 PM

## 2014-05-08 NOTE — H&P (Signed)
[facility] Admission History and Physical       [name] YHC:[id number] DOB: 03/22/1966 DOA: 05/08/2014  Referring physician: EDP PCP: Lolita Patella, MD  Specialists:   Chief Complaint: SOB and chest and Back Pain  HPI: Frank [name] is a 48 y.o. male with a history of ESRD on HD for 8 years since his renal transplant rejection, also history of Malignant HTN, and DM2, who presents to the ED with complaints of Back and Chest pain x 3 days with increased SOB.  He reports having pleuritic Chest pain.   He was evaluated in the ED and found to have Pulmonary Embolism of the LUL on CTA of the Chest.    He was placed on Full dose Lovenox and referred for medical admission.         Review of Systems:  Constitutional: No Weight Loss, No Weight Gain, Night Sweats, Fevers, Chills, Dizziness, Fatigue, or Generalized Weakness HEENT: No Headaches, Difficulty Swallowing,Tooth/Dental Problems,Sore Throat,  No Sneezing, Rhinitis, Ear Ache, Nasal Congestion, or Post Nasal Drip,  Cardio-vascular: +Chest pain, Orthopnea, PND, Edema in Lower Extremities, Anasarca, Dizziness, Palpitations  Resp: +Dyspnea, No DOE, No Cough, No Hemoptysis, No Wheezing.    GI: No Heartburn, Indigestion, Abdominal Pain, Nausea, Vomiting, Diarrhea, Hematemesis, Hematochezia, Melena, Change in Bowel Habits,  Loss of Appetite  GU: No Dysuria, Change in Color of Urine, No Urgency or Frequency, No Flank pain.  Musculoskeletal: No Joint Pain or Swelling, No Decreased Range of Motion, No Back Pain.  Neurologic: No Syncope, No Seizures, Muscle Weakness, Paresthesia, Vision Disturbance or Loss, No Diplopia, No Vertigo, No Difficulty Walking,  Skin: No Rash or Lesions. Psych: No Change in Mood or Affect, No Depression or Anxiety, No Memory loss, No Confusion, or Hallucinations   Past Medical History  Diagnosis Date  . Hypertension   . ESRD (end stage renal disease)     due to HTN per patient, followed at [facility], s/p  failed kidney transplant  . Depression   . Complication of anesthesia     itching, sore throat  . Diabetes mellitus without complication     No history per patient, but remains under history as A1c would not be accurate given on dialysis  . Shortness of breath   . Anxiety       Past Surgical History  Procedure Laterality Date  . Kidney receipient  2006    failed and started HD in March 2014  . Capd insertion    . Capd removal         Prior to Admission medications   Medication Sig Start Date End Date Taking? Authorizing Provider  acetaminophen (TYLENOL) 325 MG tablet Take 2 tablets (650 mg total) by mouth every 4 (four) hours as needed for mild pain, fever or headache. 04/06/14  Yes Delfina Redwood, MD  allopurinol (ZYLOPRIM) 100 MG tablet Take 100 mg by mouth daily.   Yes Historical Provider, MD  amLODipine (NORVASC) 5 MG tablet Take 10 mg by mouth daily.   Yes Historical Provider, MD  cinacalcet (SENSIPAR) 30 MG tablet Take 30 mg by mouth daily.   Yes Historical Provider, MD  cloNIDine (CATAPRES) 0.3 MG tablet Take 1 tablet (0.3 mg total) by mouth 3 (three) times daily. 04/06/14  Yes Delfina Redwood, MD  diazepam (VALIUM) 5 MG tablet Take 1 tablet (5 mg total) by mouth 2 (two) times daily. 01/25/14  Yes [name], PA-C  furosemide (LASIX) 80 MG tablet Take 80 mg by mouth  daily.   Yes Historical Provider, MD  hydrALAZINE (APRESOLINE) 25 MG tablet Take 50 mg by mouth 3 (three) times daily.   Yes Historical Provider, MD  isosorbide dinitrate (ISORDIL) 40 MG tablet Take 40 mg by mouth every 8 (eight) hours.   Yes Historical Provider, MD  lisinopril (PRINIVIL,ZESTRIL) 20 MG tablet Take 20 mg by mouth daily.   Yes Historical Provider, MD  minoxidil (LONITEN) 2.5 MG tablet Take 1 tablet (2.5 mg total) by mouth daily. 04/06/14  Yes Delfina Redwood, MD  NIFEdipine (PROCARDIA-XL/ADALAT CC) 30 MG 24 hr tablet Take 1 tablet (30 mg total) by mouth daily. 04/06/14  Yes Delfina Redwood,  MD  omeprazole (PRILOSEC) 20 MG capsule Take 20 mg by mouth daily. 07/01/13  Yes Historical Provider, MD  predniSONE (DELTASONE) 5 MG tablet Take 15 mg by mouth daily with breakfast.   Yes Historical Provider, MD  sevelamer carbonate (RENVELA) 800 MG tablet Take 1,600 mg by mouth 3 (three) times daily with meals. 12/27/13 12/27/14 Yes Historical Provider, MD  tacrolimus (PROGRAF) 5 MG capsule Take 10 mg by mouth 2 (two) times daily.    Yes Historical Provider, MD  cyclobenzaprine (FLEXERIL) 10 MG tablet Take 1 tablet (10 mg total) by mouth 3 (three) times daily as needed. 05/05/14   Janice Norrie, MD  traMADol (ULTRAM) 50 MG tablet Take 2 tablets (100 mg total) by mouth every 6 (six) hours as needed. 05/05/14   Janice Norrie, MD      Allergies  Allergen Reactions  . Ferrlecit [Na Ferric Gluc Cplx In Sucrose] Shortness Of Breath, Swelling and Other (See Comments)    Swelling in throat  . Darvocet [Propoxyphene N-Acetaminophen] Hives     Social History:  reports that he has quit smoking. His smoking use included Cigarettes. He has a 1 pack-year smoking history. He has never used smokeless tobacco. He reports that he does not drink alcohol or use illicit drugs.     No family history on file.     Physical Exam:  GEN:  Pleasant Well nourished and Well Developed 48 y.o. African American male  examined  and in no acute distress; cooperative with exam Filed Vitals:   05/08/14 0448 05/08/14 0452 05/08/14 0500 05/08/14 0515  BP: 170/124 187/139 176/135 172/127  Pulse: 79 80 77 71  Temp:      TempSrc:      Resp: _0 Weight:      SpO2: 97% 99% 99% 96%   Blood pressure 172/127, pulse 71, temperature 97.8 F (36.6 C), temperature source Oral, resp. rate 15, weight 80.287 kg (177 lb), SpO2 96.00%. PSYCH: He is alert and oriented x4; does not appear anxious does not appear depressed; affect is normal HEENT: Normocephalic and Atraumatic, Mucous membranes pink; PERRLA; EOM intact; Fundi:   Benign;  No scleral icterus, Nares: Patent, Oropharynx: Clear, Fair Dentition,    Neck:  FROM, No Cervical Lymphadenopathy nor Thyromegaly or Carotid Bruit; No JVD; Breasts:: Not examined CHEST WALL: No tenderness CHEST: Normal respiration, clear to auscultation bilaterally HEART: Regular rate and rhythm; no murmurs rubs or gallops BACK: No kyphosis or scoliosis; No CVA tenderness ABDOMEN: Positive Bowel Sounds, Soft Non-Tender; No Masses, No Organomegaly. Rectal Exam: Not done EXTREMITIES: No Cyanosis, Clubbing, or Edema; No Ulcerations. Genitalia: not examined PULSES: 2+ and symmetric SKIN: Normal hydration no rash or ulceration CNS:  Alert and Oriented x 4, No Focal Deficits Vascular: pulses palpable throughout    Labs on Admission:  Basic Metabolic Panel:  Recent Labs Lab 05/05/14 0346 05/06/14 0323 05/08/14 0015  NA 143 137 140  K 5.5* 6.0* 4.8  CL 104 100 100  CO2 19 18* 21  GLUCOSE 90 86 100*  BUN 70* 82* 72*  CREATININE 14.53* 16.12* 14.52*  CALCIUM 8.7 8.6 8.3*   Liver Function Tests:  Recent Labs Lab 05/05/14 0346  AST 26  ALT 20  ALKPHOS 70  BILITOT 0.3  PROT 6.6  ALBUMIN 2.7*   No results found for this basename: LIPASE, AMYLASE,  in the last 168 hours No results found for this basename: AMMONIA,  in the last 168 hours CBC:  Recent Labs Lab 05/05/14 0346 05/06/14 0323 05/08/14 0015  WBC 6.8 6.2 5.6  NEUTROABS 4.0 3.9 3.5  HGB 9.3* 8.9* 9.1*  HCT 29.7* 27.9* 29.0*  MCV 74.4* 72.3* 74.2*  PLT 158 151 142*   Cardiac Enzymes:  Recent Labs Lab 05/08/14 0212  TROPONINI <0.30    BNP (last 3 results)  Recent Labs  01/02/14 0317 01/18/14 0048 04/15/14 0809  PROBNP >70000.0* >70000.0* >70000.0*   CBG: No results found for this basename: GLUCAP,  in the last 168 hours  Radiological Exams on Admission: Dg Chest 2 View  05/08/2014   CLINICAL DATA:  Shortness of breath.  Initial encounter.  EXAM: CHEST  2 VIEW  COMPARISON:   05/06/2014.  FINDINGS: There is moderate cardiomegaly. Aortic contours are unremarkable and stable.  There is no edema, consolidation, effusion, or pneumothorax.  Stable positioning of dialysis catheter via right IJ approach. The tip is at the upper right atrium.  IMPRESSION: Cardiomegaly without failure.   Electronically Signed   By: Jorje Guild M.D.   On: 05/08/2014 00:59   Ct Angio Chest Pe W/cm &/or Wo Cm  05/08/2014   CLINICAL DATA:  Right-sided chest pain for 2 days. Initial encounter  EXAM: CT ANGIOGRAPHY CHEST WITH CONTRAST  TECHNIQUE: Multidetector CT imaging of the chest was performed using the standard protocol during bolus administration of intravenous contrast. Multiplanar CT image reconstructions and MIPs were obtained to evaluate the vascular anatomy.  CONTRAST:  162m OMNIPAQUE IOHEXOL 350 MG/ML SOLN  COMPARISON:  None.  FINDINGS: THORACIC INLET/BODY WALL:  Dialysis catheter via right IJ approach. Tip is in the upper right atrium. Low-attenuation along the catheter is discrete, suggestive of fibrin sheath.  MEDIASTINUM:  Acute subsegmental pulmonary embolism to the left upper lobe, apical segment. No definite additional pulmonary embolism is identified. There is poor enhancement of pulmonary arteries in the anterior basal segment left lower lobe and in the lingula, but these contact the left heart and are affected by motion.  Multi chamber cardiomegaly. There is likely a small pleural effusion, although this difficult to differentiate pleural fluid from myocardium due to the contrast timing. No definitive evidence of hemopericardium in this dialysis patient. There is limited opacification of the systemic arterial tree. No acute aortic findings present.  LUNG WINDOWS:  There is mild diffuse bronchial wall thickening. No edema or pneumonia. No effusion or pneumothorax. Mild atelectasis around the left heart.  UPPER ABDOMEN:  No acute findings.  Advanced bilateral renal atrophy.  OSSEOUS:  No  acute fracture.  No suspicious lytic or blastic lesions.  Critical Value/emergent results were called by telephone at the time of interpretation on 05/08/2014 at 4:35 am to Dr. DDelora Fuel, who verbally acknowledged these results.  Review of the MIP images confirms the above findings.  IMPRESSION: 1. Acute subsegmental pulmonary embolism to the  left upper lobe. No definite additional embolism, as above. 2. Right IJ dialysis catheter complicated by fibrin sheath.   Electronically Signed   By: Jorje Guild M.D.   On: 05/08/2014 04:37     EKG: Independently reviewed.    Assessment/Plan:   48 y.o. male with   Principal Problem:   1.    Pulmonary embolism   Full dose Lovenox Rx   Coumadin vs Newer Agent for 6 months  Active Problems:   2.   Hypertensive urgency   IV Hydralazine   Resume and Adjust Home Meds for BPs    3.   Pleuritic chest pain    Due to #1     4.   SOB (shortness of breath)   O2 PRN     5.   ESRD on dialysis- Tues, Thurs, Sat in [place], nephro: [name]   Dialysis Team to be Notified     6.   Malignant essential hypertension   See #2    7.   Anemia of renal disease   Send Anemia Panel   Monitor Hemoglobin Levels      8.  Diabetes mellitus   SSI coverage    Check HbA1c   9.   DVT Prophylaxis     See #1      Code Status:  FULL CODE     Family Communication:  No Family   Disposition Plan:   Inpatient  Time spent:  [facility] Pager [phone]   If [place] Please Contact the Day Rounding Team MD for [facility]  If 7PM-7AM, Please Contact Night-Floor Coverage  [email or web address] Password [email or web address] 05/08/2014, 5:59 AM

## 2014-05-08 NOTE — Progress Notes (Signed)
Patient admitted after midnight. Chart reviewed. Patient admitted.  Patient known to me from previous admission with hypertensive urgency. Continue lovenox. Adjust antihypertensives. Coumadin per pharmacy. Not a candidate for NoAcs due to ESRD.  Discussed with nephrology  Doree Barthel, MD [facility] [phone]

## 2014-05-08 NOTE — ED Notes (Signed)
Called to check on meal tray for pt. Informed that it should be on its way shortly.

## 2014-05-08 NOTE — Consult Note (Signed)
[facility] Renal Consultation Note  Indication for Consultation:  Management of ESRD/hemodialysis; anemia, hypertension/volume and secondary hyperparathyroidism  HPI: Frank [name] is a 48 y.o. AA male with a history of hypertension and ESRD, on dialysis for five years prior to renal transplant in 2006 and starting again in 09/2012 with transplant failure, now followed by [name] with dialysis on TTS, who presented to the ER with one day of pleuritic chest pain, dyspnea, and nausea and was found to have acute pulmonary embolism of the left upper lobe.  He started Lovenox and will be admitted, requiring hemodialysis on his regular schedule after a full outpatient treatment yesterday.  Imaging showed no pulmonary edema, and his potassium is currently 4.8.  He is currently on IV Hydralazine, as well as his home medications, for elevated blood pressure.  He came to the hospital on 9/6 (left AMA 9/7), 9/13, 9/15-16, and 9/25, all for headache secondary to hypertensive urgency, and again 10/15 and 16 for neck pain and sore throat.  He reports that yesterday he had nausea and vomiting x 5, but denies fever, chills, or diarrhea.  Dialysis Orders:  Need outpatient dialysis information  Past Medical History  Diagnosis Date  . Hypertension   . ESRD (end stage renal disease)     due to HTN per patient, followed at [facility], s/p failed kidney transplant  . Depression   . Complication of anesthesia     itching, sore throat  . Diabetes mellitus without complication     No history per patient, but remains under history as A1c would not be accurate given on dialysis  . Shortness of breath   . Anxiety    Past Surgical History  Procedure Laterality Date  . Kidney receipient  2006    failed and started HD in March 2014  . Capd insertion    . Capd removal     No family history on file.  Social History He reports that he has quit smoking cigarettes after a 1 pack-year history  and denies any use of alcohol or illicit drugs.  Allergies  Allergen Reactions  . Ferrlecit [Na Ferric Gluc Cplx In Sucrose] Shortness Of Breath, Swelling and Other (See Comments)    Swelling in throat  . Darvocet [Propoxyphene N-Acetaminophen] Hives   Prior to Admission medications   Medication Sig Start Date End Date Taking? Authorizing Provider  acetaminophen (TYLENOL) 325 MG tablet Take 2 tablets (650 mg total) by mouth every 4 (four) hours as needed for mild pain, fever or headache. 04/06/14  Yes Delfina Redwood, MD  allopurinol (ZYLOPRIM) 100 MG tablet Take 100 mg by mouth daily.   Yes Historical Provider, MD  amLODipine (NORVASC) 5 MG tablet Take 10 mg by mouth daily.   Yes Historical Provider, MD  cinacalcet (SENSIPAR) 30 MG tablet Take 30 mg by mouth daily.   Yes Historical Provider, MD  cloNIDine (CATAPRES) 0.3 MG tablet Take 1 tablet (0.3 mg total) by mouth 3 (three) times daily. 04/06/14  Yes Delfina Redwood, MD  diazepam (VALIUM) 5 MG tablet Take 1 tablet (5 mg total) by mouth 2 (two) times daily. 01/25/14  Yes [name], PA-C  furosemide (LASIX) 80 MG tablet Take 80 mg by mouth daily.   Yes Historical Provider, MD  hydrALAZINE (APRESOLINE) 25 MG tablet Take 50 mg by mouth 3 (three) times daily.   Yes Historical Provider, MD  isosorbide dinitrate (ISORDIL) 40 MG tablet Take 40 mg by mouth every 8 (eight)  hours.   Yes Historical Provider, MD  lisinopril (PRINIVIL,ZESTRIL) 20 MG tablet Take 20 mg by mouth daily.   Yes Historical Provider, MD  minoxidil (LONITEN) 2.5 MG tablet Take 1 tablet (2.5 mg total) by mouth daily. 04/06/14  Yes Delfina Redwood, MD  NIFEdipine (PROCARDIA-XL/ADALAT CC) 30 MG 24 hr tablet Take 1 tablet (30 mg total) by mouth daily. 04/06/14  Yes Delfina Redwood, MD  omeprazole (PRILOSEC) 20 MG capsule Take 20 mg by mouth daily. 07/01/13  Yes Historical Provider, MD  predniSONE (DELTASONE) 5 MG tablet Take 15 mg by mouth daily with breakfast.   Yes  Historical Provider, MD  sevelamer carbonate (RENVELA) 800 MG tablet Take 1,600 mg by mouth 3 (three) times daily with meals. 12/27/13 12/27/14 Yes Historical Provider, MD  tacrolimus (PROGRAF) 5 MG capsule Take 10 mg by mouth 2 (two) times daily.    Yes Historical Provider, MD  cyclobenzaprine (FLEXERIL) 10 MG tablet Take 1 tablet (10 mg total) by mouth 3 (three) times daily as needed. 05/05/14   Janice Norrie, MD  traMADol (ULTRAM) 50 MG tablet Take 2 tablets (100 mg total) by mouth every 6 (six) hours as needed. 05/05/14   Janice Norrie, MD   Labs:  Results for orders placed during the hospital encounter of 05/08/14 (from the past 48 hour(s))  BASIC METABOLIC PANEL     Status: Abnormal   Collection Time    05/08/14 12:15 AM      Result Value Ref Range   Sodium 140  137 - 147 mEq/L   Potassium 4.8  3.7 - 5.3 mEq/L   Comment: DELTA CHECK NOTED   Chloride 100  96 - 112 mEq/L   CO2 21  19 - 32 mEq/L   Glucose, Bld 100 (*) 70 - 99 mg/dL   BUN 72 (*) 6 - 23 mg/dL   Creatinine, Ser 14.52 (*) 0.50 - 1.35 mg/dL   Calcium 8.3 (*) 8.4 - 10.5 mg/dL   GFR calc non Af Amer 3 (*) >90 mL/min   GFR calc Af Amer 4 (*) >90 mL/min   Comment: (NOTE)     The eGFR has been calculated using the CKD EPI equation.     This calculation has not been validated in all clinical situations.     eGFR's persistently <90 mL/min signify possible Chronic Kidney     Disease.   Anion gap 19 (*) 5 - 15  CBC WITH DIFFERENTIAL     Status: Abnormal   Collection Time    05/08/14 12:15 AM      Result Value Ref Range   WBC 5.6  4.0 - 10.5 K/uL   RBC 3.91 (*) 4.22 - 5.81 MIL/uL   Hemoglobin 9.1 (*) 13.0 - 17.0 g/dL   HCT 29.0 (*) 39.0 - 52.0 %   MCV 74.2 (*) 78.0 - 100.0 fL   MCH 23.3 (*) 26.0 - 34.0 pg   MCHC 31.4  30.0 - 36.0 g/dL   RDW 18.2 (*) 11.5 - 15.5 %   Platelets 142 (*) 150 - 400 K/uL   Neutrophils Relative % 64  43 - 77 %   Neutro Abs 3.5  1.7 - 7.7 K/uL   Lymphocytes Relative 23  12 - 46 %   Lymphs Abs 1.3   0.7 - 4.0 K/uL   Monocytes Relative 11  3 - 12 %   Monocytes Absolute 0.6  0.1 - 1.0 K/uL   Eosinophils Relative 2  0 - 5 %  Eosinophils Absolute 0.1  0.0 - 0.7 K/uL   Basophils Relative 0  0 - 1 %   Basophils Absolute 0.0  0.0 - 0.1 K/uL  TROPONIN I     Status: None   Collection Time    05/08/14  2:12 AM      Result Value Ref Range   Troponin I <0.30  <0.30 ng/mL   Comment:            Due to the release kinetics of cTnI,     a negative result within the first hours     of the onset of symptoms does not rule out     myocardial infarction with certainty.     If myocardial infarction is still suspected,     repeat the test at appropriate intervals.   Constitutional: negative for chills, fatigue, fevers and sweats Ears, nose, mouth, throat, and face: negative for earaches, hoarseness, nasal congestion and sore throat Respiratory: positive for dyspnea on exertion and pleurisy/chest pain, negative for cough, hemoptysis and sputum Cardiovascular: positive for dyspnea, negative for chest pressure/discomfort, orthopnea and palpitations Gastrointestinal: positive for nausea, negative for abdominal pain, change in bowel habits and vomiting Genitourinary:negative, oliguric Musculoskeletal:positive for back and neck pain; negative for arthralgias and myalgias Neurological: negative for dizziness, gait problems, headaches, paresthesia and speech problems  Physical Exam: Filed Vitals:   05/08/14 1100  BP: 173/130  Pulse: 87  Temp:   Resp: 23     General appearance: alert, cooperative and no distress Head: Normocephalic, without obvious abnormality, atraumatic Neck: no adenopathy, no carotid bruit, no JVD and supple, symmetrical, trachea midline Resp: clear to auscultation bilaterally Cardio: regular rate and rhythm, S1, S2 normal, no murmur, click, rub or gallop GI: soft, non-tender; bowel sounds normal; no masses,  no organomegaly Extremities: atraumatic, no cyanosis, trace  edema Neurologic: Grossly normal Dialysis Access: R IJ catheter    Assessment/Plan: 1. Acute PE - @ LUL per CT angiography; started Lovenox. 2. Hypertension/volume - BP elevated, currently 173/130, on IV Hydralazine; also Amlodipine 10 mg qd, Clonidine 0.3 mg tid, PO Hydralazine 25 mg q6h; states EDW of 175 lbs, currently 177, CXR negative for edema. 3. ESRD - HD on TTS @ [facility] K 4.4 s/p HD yesterday.  No urgency need for HD, obtain outpatient HD information. 4. Anemia - Hgb 9.1, outpatient meds. 5. Metabolic bone disease - Hectorol, Sensipar 30 mg qd, Renvela 2 with meals. 6. Failed renal transplant - placed 2006, resumed HD 09/2012; on Prograf, Prednisone.  LYLES,CHARLES 05/08/2014, 12:24 PM   Attending Nephrologist: Donato Heinz, MD   I have seen and examined this patient and agree with plan as outlined by C. Lyles, PA-C.  Will provide HD while he remains an inpt and then he is to follow up with his outpt unit in [facility]. Nohealani Medinger A,MD 05/08/2014 4:04 PM

## 2014-05-08 NOTE — ED Notes (Signed)
Pt has HD on T, TH & S, attended HD yesterday (sat), c/o sob, back pain, abd pain, nvd. (denies fever), last BM today. Also mentions cramping. Alert, NAD, calm, interactive.

## 2014-05-09 ENCOUNTER — Encounter (HOSPITAL_COMMUNITY): Payer: Self-pay | Admitting: *Deleted

## 2014-05-09 DIAGNOSIS — F411 Generalized anxiety disorder: Secondary | ICD-10-CM | POA: Diagnosis present

## 2014-05-09 LAB — BASIC METABOLIC PANEL
Anion gap: 18 — ABNORMAL HIGH (ref 5–15)
BUN: 79 mg/dL — AB (ref 6–23)
CO2: 22 mEq/L (ref 19–32)
Calcium: 8.5 mg/dL (ref 8.4–10.5)
Chloride: 94 mEq/L — ABNORMAL LOW (ref 96–112)
Creatinine, Ser: 15.96 mg/dL — ABNORMAL HIGH (ref 0.50–1.35)
GFR calc Af Amer: 3 mL/min — ABNORMAL LOW (ref >90)
GFR, EST NON AFRICAN AMERICAN: 3 mL/min — AB (ref >90)
Glucose, Bld: 99 mg/dL (ref 70–99)
POTASSIUM: 5.6 meq/L — AB (ref 3.7–5.3)
Sodium: 134 mEq/L — ABNORMAL LOW (ref 137–147)

## 2014-05-09 LAB — PROTIME-INR
INR: 1.38 (ref 0.00–1.49)
PROTHROMBIN TIME: 17.1 s — AB (ref 11.6–15.2)

## 2014-05-09 LAB — CBC
HCT: 25.7 % — ABNORMAL LOW (ref 39.0–52.0)
Hemoglobin: 8.3 g/dL — ABNORMAL LOW (ref 13.0–17.0)
MCH: 23.6 pg — AB (ref 26.0–34.0)
MCHC: 32.3 g/dL (ref 30.0–36.0)
MCV: 73.2 fL — ABNORMAL LOW (ref 78.0–100.0)
Platelets: 141 10*3/uL — ABNORMAL LOW (ref 150–400)
RBC: 3.51 MIL/uL — ABNORMAL LOW (ref 4.22–5.81)
RDW: 18.5 % — ABNORMAL HIGH (ref 11.5–15.5)
WBC: 5.4 10*3/uL (ref 4.0–10.5)

## 2014-05-09 MED ORDER — DIAZEPAM 5 MG PO TABS
5.0000 mg | ORAL_TABLET | Freq: Two times a day (BID) | ORAL | Status: DC | PRN
  Administered 2014-05-11 – 2014-05-12 (×2): 5 mg via ORAL
  Filled 2014-05-09: qty 1

## 2014-05-09 MED ORDER — TACROLIMUS 1 MG PO CAPS
5.0000 mg | ORAL_CAPSULE | Freq: Two times a day (BID) | ORAL | Status: DC
  Administered 2014-05-09 – 2014-05-13 (×8): 5 mg via ORAL
  Filled 2014-05-09 (×10): qty 5

## 2014-05-09 MED ORDER — HYDRALAZINE HCL 50 MG PO TABS
100.0000 mg | ORAL_TABLET | Freq: Three times a day (TID) | ORAL | Status: DC
  Administered 2014-05-09 – 2014-05-13 (×11): 100 mg via ORAL
  Filled 2014-05-09 (×15): qty 2

## 2014-05-09 MED ORDER — WARFARIN SODIUM 10 MG PO TABS
10.0000 mg | ORAL_TABLET | Freq: Once | ORAL | Status: AC
  Administered 2014-05-09: 10 mg via ORAL
  Filled 2014-05-09: qty 1

## 2014-05-09 MED ORDER — NIFEDIPINE ER 60 MG PO TB24
60.0000 mg | ORAL_TABLET | Freq: Every day | ORAL | Status: DC
  Administered 2014-05-10 – 2014-05-13 (×4): 60 mg via ORAL
  Filled 2014-05-09 (×4): qty 1

## 2014-05-09 MED ORDER — PREDNISONE 2.5 MG PO TABS
2.5000 mg | ORAL_TABLET | Freq: Two times a day (BID) | ORAL | Status: DC
  Administered 2014-05-09 – 2014-05-13 (×7): 2.5 mg via ORAL
  Filled 2014-05-09 (×10): qty 1

## 2014-05-09 NOTE — Care Management Note (Signed)
    Page 1 of 1   05/09/2014     3:08:58 PM CARE MANAGEMENT NOTE 05/09/2014  Patient:  Frank [name], Frank [name]   Account Number:  [id number]  Date Initiated:  05/09/2014  Documentation initiated by:  Marvetta Gibbons  Subjective/Objective Assessment:   Pt admitted with positive PE     Action/Plan:   PTA pt lived at home home, hx of HD-T/TH/SAT   Anticipated DC Date:  05/11/2014   Anticipated DC Plan:  [place]  CM consult      Choice offered to / List presented to:             Status of service:  In process, will continue to follow Medicare Important Message given?   (If response is "NO", the following Medicare IM given date fields will be blank) Date Medicare IM given:   Medicare IM given by:   Date Additional Medicare IM given:   Additional Medicare IM given by:    Discharge Disposition:    Per UR Regulation:  Reviewed for med. necessity/level of care/duration of stay  If discussed at [place] of Stay Meetings, dates discussed:    Comments:  05/09/14- 1500- Marvetta Gibbons RN, BSN [phone] Referral for PCP and coumadin check- spoke with pt at bedside- per conversation pt states that he has a renal MD at [facility]- does not have a PCP- discussed different clinic options - pt open to being set up at [facility] for INR check and PCP- will also give pt [facility] # for future reference in case he wants to change PCP- he does state that he has both Medicare and Medicaid- and that he is not assigned a PCP on his Medicaid card so he is open to go to whoever he chooses for a PCP- will arrange f/u appointment with [facility] once d/c date known.

## 2014-05-09 NOTE — Progress Notes (Signed)
Scheduled bp med given

## 2014-05-09 NOTE — Progress Notes (Signed)
ANTICOAGULATION CONSULT NOTE - Follow Up Consult  Pharmacy Consult for Lovenox and warfarin Indication: LUL pulmonary embolism  Allergies  Allergen Reactions  . Ferrlecit [Na Ferric Gluc Cplx In Sucrose] Shortness Of Breath, Swelling and Other (See Comments)    Swelling in throat  . Darvocet [Propoxyphene N-Acetaminophen] Hives    Patient Measurements: Height: _0  (188 cm) Weight: 176 lb 9.4 oz (80.1 kg) IBW/kg (Calculated) : 82.2  Vital Signs: Temp: 97.5 F (36.4 C) (10/19 1212) Temp Source: Oral (10/19 1212) BP: 187/122 mmHg (10/19 1212) Pulse Rate: 87 (10/19 1212)  Labs:  Recent Labs  05/08/14 0015 05/08/14 0212 05/08/14 1600 05/09/14 0252  HGB 9.1*  --   --  8.3*  HCT 29.0*  --   --  25.7*  PLT 142*  --   --  141*  LABPROT  --   --  16.3* 17.1*  INR  --   --  1.30 1.38  CREATININE 14.52*  --   --  15.96*  TROPONINI  --  <0.30  --   --     Estimated Creatinine Clearance: 6.3 ml/min (by C-G formula based on Cr of 15.96).   Assessment: 48 y/o male with ESRD admitted for new LUL PE. He was started on Lovenox to bridge warfarin. INR is subtherapeutic at 1.38. H/H/pltc are low stable, no bleeding noted.  Today is day 2 of Lovenox to warfarin bridge. Bridge to continue a minimum of 5 days AND until INR >= 2 for 24 hours.  Goal of Therapy:  INR 2-3 Anti-Xa level 0.6-1 units/ml 4hrs after LMWH dose given Monitor platelets by anticoagulation protocol: Yes   Plan:  - Lovenox 80 mg SQ q24h - Warfarin 10 mg PO tonight - INR daily, CBC q72h while on Lovenox - Monitor for s/sx of bleeding  [facility], Pharm.D., BCPS Clinical Pharmacist Pager: [phone] 05/09/2014 1:21 PM

## 2014-05-09 NOTE — Progress Notes (Signed)
Utilization review completed

## 2014-05-09 NOTE — Progress Notes (Signed)
Hydralazine 26m iv given for bp

## 2014-05-09 NOTE — Progress Notes (Signed)
[facility] PROGRESS NOTE  Frank [name] SAY:[id number] DOB: 06-Jul-1966 DOA: 05/08/2014 PCP: Lolita Patella, MD  Assessment/Plan:  Principal Problem:   Pulmonary embolism: continue lovenox and coumadin. Not a candidate for NoAc due to ESRD. Needs PCP in [place] to monitor. Consult CM. Needs appointment next week.  Would keep inpt if at all possible for lovenox/coumadin bridge (today is day 2), as pt has [name], compliance at home questionable. Hopefully, won't leave AMA this time, as he has done multiple times in the past.  Active Problems:   ESRD on dialysis- Tues, Thurs, Sat in [employer] [facility], nephro: [name]. Nephrology following   Hypertensive urgency: BPs still high, which is not new. Will change hydralazine to 100 tid. Change procardia xl to 60 and continue the rest. Continue SDU until BP better controlled   Anemia of renal disease stable   Generalized anxiety disorder: too sleepy. Change valium to bid PRN   Code Status:  full Family Communication:   Disposition Plan:  Home after stabilized  Consultants:  nephrology  Procedures:     Antibiotics:    HPI/Subjective: CP and dyspnea much better. Willing to stay today.  Objective: Filed Vitals:   05/09/14 0828  BP: 187/133  Pulse: 84  Temp: 97.5 F (36.4 C)  Resp: 17    Intake/Output Summary (Last 24 hours) at 05/09/14 1017 Last data filed at 05/09/14 0828  Gross per 24 hour  Intake   1200 ml  Output      0 ml  Net   1200 ml   Filed Weights   05/08/14 0007 05/08/14 1545  Weight: 80.287 kg (177 lb) 80.1 kg (176 lb 9.4 oz)    Exam:   General:  Asleep. Arousable. cooperative  Cardiovascular: RRR without MGR  Respiratory: CTA without WRR  Abdomen: S, NT, ND  Ext: no CCE  Basic Metabolic Panel:  Recent Labs Lab 05/05/14 0346 05/06/14 0323 05/08/14 0015 05/09/14 0252  NA 143 137 140 134*  K 5.5* 6.0* 4.8 5.6*  CL 104 100 100 94*  CO2 19 18* 21 22  GLUCOSE 90 86 100* 99  BUN 70* 82* 72* 79*   CREATININE 14.53* 16.12* 14.52* 15.96*  CALCIUM 8.7 8.6 8.3* 8.5   Liver Function Tests:  Recent Labs Lab 05/05/14 0346  AST 26  ALT 20  ALKPHOS 70  BILITOT 0.3  PROT 6.6  ALBUMIN 2.7*   No results found for this basename: LIPASE, AMYLASE,  in the last 168 hours No results found for this basename: AMMONIA,  in the last 168 hours CBC:  Recent Labs Lab 05/05/14 0346 05/06/14 0323 05/08/14 0015 05/09/14 0252  WBC 6.8 6.2 5.6 5.4  NEUTROABS 4.0 3.9 3.5  --   HGB 9.3* 8.9* 9.1* 8.3*  HCT 29.7* 27.9* 29.0* 25.7*  MCV 74.4* 72.3* 74.2* 73.2*  PLT 158 151 142* 141*   Cardiac Enzymes:  Recent Labs Lab 05/08/14 0212  TROPONINI <0.30   BNP (last 3 results)  Recent Labs  01/02/14 0317 01/18/14 0048 04/15/14 0809  PROBNP >70000.0* >70000.0* >70000.0*   CBG: No results found for this basename: GLUCAP,  in the last 168 hours  Recent Results (from the past 240 hour(s))  RAPID STREP SCREEN     Status: None   Collection Time    05/05/14  4:20 AM      Result Value Ref Range Status   Streptococcus, Group A Screen (Direct) NEGATIVE  NEGATIVE Final   Comment: (NOTE)     A  Rapid Antigen test may result negative if the antigen level in the     sample is below the detection level of this test. The FDA has not     cleared this test as a stand-alone test therefore the rapid antigen     negative result has reflexed to a Group A Strep culture.  CULTURE, GROUP A STREP     Status: None   Collection Time    05/05/14  4:20 AM      Result Value Ref Range Status   Specimen Description THROAT   Final   Special Requests NONE   Final   Culture     Final   Value: No Beta Hemolytic Streptococci Isolated     Performed at [facility]   Report Status 05/07/2014 FINAL   Final  MRSA PCR SCREENING     Status: None   Collection Time    05/08/14  4:07 PM      Result Value Ref Range Status   MRSA by PCR NEGATIVE  NEGATIVE Final   Comment:            The GeneXpert MRSA Assay  (FDA     approved for NASAL specimens     only), is one component of a     comprehensive MRSA colonization     surveillance program. It is not     intended to diagnose MRSA     infection nor to guide or     monitor treatment for     MRSA infections.     Studies: Dg Chest 2 View  05/08/2014   CLINICAL DATA:  Shortness of breath.  Initial encounter.  EXAM: CHEST  2 VIEW  COMPARISON:  05/06/2014.  FINDINGS: There is moderate cardiomegaly. Aortic contours are unremarkable and stable.  There is no edema, consolidation, effusion, or pneumothorax.  Stable positioning of dialysis catheter via right IJ approach. The tip is at the upper right atrium.  IMPRESSION: Cardiomegaly without failure.   Electronically Signed   By: Jorje Guild M.D.   On: 05/08/2014 00:59   Ct Angio Chest Pe W/cm &/or Wo Cm  05/08/2014   CLINICAL DATA:  Right-sided chest pain for 2 days. Initial encounter  EXAM: CT ANGIOGRAPHY CHEST WITH CONTRAST  TECHNIQUE: Multidetector CT imaging of the chest was performed using the standard protocol during bolus administration of intravenous contrast. Multiplanar CT image reconstructions and MIPs were obtained to evaluate the vascular anatomy.  CONTRAST:  162m OMNIPAQUE IOHEXOL 350 MG/ML SOLN  COMPARISON:  None.  FINDINGS: THORACIC INLET/BODY WALL:  Dialysis catheter via right IJ approach. Tip is in the upper right atrium. Low-attenuation along the catheter is discrete, suggestive of fibrin sheath.  MEDIASTINUM:  Acute subsegmental pulmonary embolism to the left upper lobe, apical segment. No definite additional pulmonary embolism is identified. There is poor enhancement of pulmonary arteries in the anterior basal segment left lower lobe and in the lingula, but these contact the left heart and are affected by motion.  Multi chamber cardiomegaly. There is likely a small pleural effusion, although this difficult to differentiate pleural fluid from myocardium due to the contrast timing. No  definitive evidence of hemopericardium in this dialysis patient. There is limited opacification of the systemic arterial tree. No acute aortic findings present.  LUNG WINDOWS:  There is mild diffuse bronchial wall thickening. No edema or pneumonia. No effusion or pneumothorax. Mild atelectasis around the left heart.  UPPER ABDOMEN:  No acute findings.  Advanced bilateral renal atrophy.  OSSEOUS:  No acute fracture.  No suspicious lytic or blastic lesions.  Critical Value/emergent results were called by telephone at the time of interpretation on 05/08/2014 at 4:35 am to Dr. Delora Fuel , who verbally acknowledged these results.  Review of the MIP images confirms the above findings.  IMPRESSION: 1. Acute subsegmental pulmonary embolism to the left upper lobe. No definite additional embolism, as above. 2. Right IJ dialysis catheter complicated by fibrin sheath.   Electronically Signed   By: Jorje Guild M.D.   On: 05/08/2014 04:37    Scheduled Meds: . allopurinol  100 mg Oral Daily  . amLODipine  10 mg Oral Daily  . cinacalcet  30 mg Oral Q breakfast  . cloNIDine  0.3 mg Oral TID  . enoxaparin (LOVENOX) injection  1 mg/kg Subcutaneous Q24H  . furosemide  80 mg Oral Daily  . hydrALAZINE  100 mg Oral 3 times per day  . isosorbide dinitrate  40 mg Oral 3 times per day  . lisinopril  20 mg Oral Daily  . minoxidil  2.5 mg Oral Daily  . [START ON 05/10/2014] NIFEdipine  60 mg Oral Daily  . pantoprazole  40 mg Oral Daily  . predniSONE  15 mg Oral Q breakfast  . sevelamer carbonate  1,600 mg Oral TID WC  . sodium chloride  3 mL Intravenous Q12H  . tacrolimus  10 mg Oral BID  . Warfarin - Pharmacist Dosing Inpatient   Does not apply q1800   Continuous Infusions:   Time spent: 35 minutes  [facility] Pager [phone]. If 7PM-7AM, please contact night-coverage at [email or web address], password [facility] 05/09/2014, 10:17 AM  LOS: 1 day

## 2014-05-09 NOTE — Progress Notes (Signed)
  [facility] Progress Note   Subjective: Feeling better, CP and SOB better  Filed Vitals:   05/08/14 2341 05/09/14 0356 05/09/14 0700 05/09/14 0828  BP: 159/119 161/112  187/133  Pulse: 77 84  84  Temp: 98.4 F (36.9 C) 97.8 F (36.6 C) 97.5 F (36.4 C) 97.5 F (36.4 C)  TempSrc: Oral Oral Oral Oral  Resp: _0 Height:      Weight:      SpO2: 97% 98%  94%   Exam: Alert, no distress Chest is clear bilat RRR no MRG Abd soft, NTND No LE edema R IJ tunneled HD cath Neuro is nf, Ox3  HD: [facility] 4h   EDW 165 lbs (182 > 175lb last Rx Sat )  350/700  F200  2/2.5 Bath TDC  Hep 5700 bolus then 800/hr ARanesp 120/ Thurs, Hectorol 4 ug TIW Cath flo placed in cath after HD on 10/17       Assessment: 1 Acute PE - on Lovenox, coumadin 2 ESRD on HD - planning to go back to [facility] soon 3 HTN uncontrolled - adjusting meds, HD in am, get vol down to dry wt, not grossly vol overloaded today, up 3 kg 4 Anemia Hb 9 5 MBD cont vit D, binders, sensipar 6 Hx failed renal Tx   Plan- HD in am    Kelly Splinter MD  pager [phone]    cell [phone]  05/09/2014, 10:10 AM     Recent Labs Lab 05/06/14 0323 05/08/14 0015 05/09/14 0252  NA 137 140 134*  K 6.0* 4.8 5.6*  CL 100 100 94*  CO2 18* 21 22  GLUCOSE 86 100* 99  BUN 82* 72* 79*  CREATININE 16.12* 14.52* 15.96*  CALCIUM 8.6 8.3* 8.5    Recent Labs Lab 05/05/14 0346  AST 26  ALT 20  ALKPHOS 70  BILITOT 0.3  PROT 6.6  ALBUMIN 2.7*    Recent Labs Lab 05/05/14 0346 05/06/14 0323 05/08/14 0015 05/09/14 0252  WBC 6.8 6.2 5.6 5.4  NEUTROABS 4.0 3.9 3.5  --   HGB 9.3* 8.9* 9.1* 8.3*  HCT 29.7* 27.9* 29.0* 25.7*  MCV 74.4* 72.3* 74.2* 73.2*  PLT 158 151 142* 141*   . allopurinol  100 mg Oral Daily  . amLODipine  10 mg Oral Daily  . cinacalcet  30 mg Oral Q breakfast  . cloNIDine  0.3 mg Oral TID  . enoxaparin (LOVENOX) injection  1 mg/kg Subcutaneous Q24H  . furosemide  80 mg  Oral Daily  . hydrALAZINE  100 mg Oral 3 times per day  . isosorbide dinitrate  40 mg Oral 3 times per day  . lisinopril  20 mg Oral Daily  . minoxidil  2.5 mg Oral Daily  . [START ON 05/10/2014] NIFEdipine  60 mg Oral Daily  . pantoprazole  40 mg Oral Daily  . predniSONE  15 mg Oral Q breakfast  . sevelamer carbonate  1,600 mg Oral TID WC  . sodium chloride  3 mL Intravenous Q12H  . tacrolimus  10 mg Oral BID  . Warfarin - Pharmacist Dosing Inpatient   Does not apply q1800     sodium chloride, acetaminophen, acetaminophen, diazepam, hydrALAZINE, nitroGLYCERIN, ondansetron (ZOFRAN) IV, ondansetron, oxyCODONE, sodium chloride, zolpidem

## 2014-05-10 LAB — BASIC METABOLIC PANEL
ANION GAP: 20 — AB (ref 5–15)
BUN: 86 mg/dL — ABNORMAL HIGH (ref 6–23)
CALCIUM: 8.4 mg/dL (ref 8.4–10.5)
CO2: 19 mEq/L (ref 19–32)
Chloride: 94 mEq/L — ABNORMAL LOW (ref 96–112)
Creatinine, Ser: 17.29 mg/dL — ABNORMAL HIGH (ref 0.50–1.35)
GFR calc Af Amer: 3 mL/min — ABNORMAL LOW (ref >90)
GFR, EST NON AFRICAN AMERICAN: 3 mL/min — AB (ref >90)
Glucose, Bld: 153 mg/dL — ABNORMAL HIGH (ref 70–99)
Potassium: 5.9 mEq/L — ABNORMAL HIGH (ref 3.7–5.3)
SODIUM: 133 meq/L — AB (ref 137–147)

## 2014-05-10 LAB — PROTIME-INR
INR: 1.68 — AB (ref 0.00–1.49)
PROTHROMBIN TIME: 19.9 s — AB (ref 11.6–15.2)

## 2014-05-10 LAB — HEPATITIS B SURFACE ANTIBODY,QUALITATIVE: Hep B S Ab: POSITIVE — AB

## 2014-05-10 MED ORDER — SODIUM CHLORIDE 0.9 % IV SOLN: 100.0000 mL | INTRAVENOUS | Status: DC | PRN

## 2014-05-10 MED ORDER — LIDOCAINE HCL (PF) 1 % IJ SOLN: 5.0000 mL | INTRAMUSCULAR | Status: DC | PRN

## 2014-05-10 MED ORDER — ALTEPLASE 2 MG IJ SOLR
2.0000 mg | Freq: Once | INTRAMUSCULAR | Status: AC | PRN
  Filled 2014-05-10: qty 2

## 2014-05-10 MED ORDER — DARBEPOETIN ALFA-POLYSORBATE 150 MCG/0.3ML IJ SOLN
120.0000 ug | INTRAMUSCULAR | Status: DC
  Administered 2014-05-12: 120 ug via INTRAVENOUS
  Filled 2014-05-10: qty 0.3

## 2014-05-10 MED ORDER — WARFARIN SODIUM 10 MG PO TABS
10.0000 mg | ORAL_TABLET | Freq: Once | ORAL | Status: AC
  Administered 2014-05-10: 10 mg via ORAL
  Filled 2014-05-10: qty 1

## 2014-05-10 MED ORDER — HEPARIN SODIUM (PORCINE) 1000 UNIT/ML DIALYSIS: 1000.0000 [IU] | INTRAMUSCULAR | Status: DC | PRN

## 2014-05-10 MED ORDER — LIDOCAINE-PRILOCAINE 2.5-2.5 % EX CREA
1.0000 | TOPICAL_CREAM | CUTANEOUS | Status: DC | PRN
Start: 2014-05-10 — End: 2014-05-12

## 2014-05-10 MED ORDER — NEPRO/CARBSTEADY PO LIQD: 237.0000 mL | ORAL | Status: DC | PRN

## 2014-05-10 MED ORDER — PENTAFLUOROPROP-TETRAFLUOROETH EX AERO: 1.0000 "application " | INHALATION_SPRAY | CUTANEOUS | Status: DC | PRN

## 2014-05-10 MED ORDER — HEPARIN SODIUM (PORCINE) 1000 UNIT/ML DIALYSIS: 4000.0000 [IU] | INTRAMUSCULAR | Status: DC | PRN

## 2014-05-10 NOTE — Procedures (Signed)
I was present at this dialysis session, have reviewed the session itself and made  appropriate changes  Kelly Splinter MD (pgr) [phone]    ([phone] 05/10/2014, 10:15 AM

## 2014-05-10 NOTE — Progress Notes (Signed)
[facility] PROGRESS NOTE  DONTRELLE MAZON ZOX:[id number] DOB: Aug 31, 1965 DOA: 05/08/2014 PCP: Lolita Patella, MD  Assessment/Plan:    Pulmonary embolism: continue lovenox and coumadin. Not a candidate for NoAc due to ESRD. Needs PCP in [place] to monitor. Consult CM. Needs appointment next week.  Would keep inpt if at all possible for lovenox/coumadin bridge (today is day 2), as pt has [name], compliance at home questionable. Hopefully, won't leave AMA this time, as he has done multiple times in the past.     ESRD on dialysis- Tues, Thurs, Sat in [employer] [facility], nephro: [name]. Nephrology following   Hypertensive urgency: BPs still high, which is not new. hydralazine to 100 tid. Change procardia xl to 60 and continue the rest.    Anemia of renal disease stable   Generalized anxiety disorder: valium to bid PRN   Code Status:  full Family Communication:   Disposition Plan:  tx to med surg  Consultants:  nephrology  Procedures:     Antibiotics:    HPI/Subjective: Going to dialysis  Objective: Filed Vitals:   05/10/14 0448  BP: 151/88  Pulse: 80  Temp: 97.4 F (36.3 C)  Resp: 20    Intake/Output Summary (Last 24 hours) at 05/10/14 0752 Last data filed at 05/10/14 0450  Gross per 24 hour  Intake   1060 ml  Output      0 ml  Net   1060 ml   Filed Weights   05/08/14 0007 05/08/14 1545  Weight: 80.287 kg (177 lb) 80.1 kg (176 lb 9.4 oz)    Exam:   General:  Awake, NAD  Cardiovascular: RRR without MGR  Respiratory: CTA without WRR  Abdomen: S, NT, ND  Ext: no CCE  Basic Metabolic Panel:  Recent Labs Lab 05/05/14 0346 05/06/14 0323 05/08/14 0015 05/09/14 0252  NA 143 137 140 134*  K 5.5* 6.0* 4.8 5.6*  CL 104 100 100 94*  CO2 19 18* 21 22  GLUCOSE 90 86 100* 99  BUN 70* 82* 72* 79*  CREATININE 14.53* 16.12* 14.52* 15.96*  CALCIUM 8.7 8.6 8.3* 8.5   Liver Function Tests:  Recent Labs Lab 05/05/14 0346  AST 26  ALT 20  ALKPHOS 70   BILITOT 0.3  PROT 6.6  ALBUMIN 2.7*   No results found for this basename: LIPASE, AMYLASE,  in the last 168 hours No results found for this basename: AMMONIA,  in the last 168 hours CBC:  Recent Labs Lab 05/05/14 0346 05/06/14 0323 05/08/14 0015 05/09/14 0252  WBC 6.8 6.2 5.6 5.4  NEUTROABS 4.0 3.9 3.5  --   HGB 9.3* 8.9* 9.1* 8.3*  HCT 29.7* 27.9* 29.0* 25.7*  MCV 74.4* 72.3* 74.2* 73.2*  PLT 158 151 142* 141*   Cardiac Enzymes:  Recent Labs Lab 05/08/14 0212  TROPONINI <0.30   BNP (last 3 results)  Recent Labs  01/02/14 0317 01/18/14 0048 04/15/14 0809  PROBNP >70000.0* >70000.0* >70000.0*   CBG: No results found for this basename: GLUCAP,  in the last 168 hours  Recent Results (from the past 240 hour(s))  RAPID STREP SCREEN     Status: None   Collection Time    05/05/14  4:20 AM      Result Value Ref Range Status   Streptococcus, Group A Screen (Direct) NEGATIVE  NEGATIVE Final   Comment: (NOTE)     A Rapid Antigen test may result negative if the antigen level in the     sample is below  the detection level of this test. The FDA has not     cleared this test as a stand-alone test therefore the rapid antigen     negative result has reflexed to a Group A Strep culture.  CULTURE, GROUP A STREP     Status: None   Collection Time    05/05/14  4:20 AM      Result Value Ref Range Status   Specimen Description THROAT   Final   Special Requests NONE   Final   Culture     Final   Value: No Beta Hemolytic Streptococci Isolated     Performed at [facility]   Report Status 05/07/2014 FINAL   Final  MRSA PCR SCREENING     Status: None   Collection Time    05/08/14  4:07 PM      Result Value Ref Range Status   MRSA by PCR NEGATIVE  NEGATIVE Final   Comment:            The GeneXpert MRSA Assay (FDA     approved for NASAL specimens     only), is one component of a     comprehensive MRSA colonization     surveillance program. It is not     intended  to diagnose MRSA     infection nor to guide or     monitor treatment for     MRSA infections.     Studies: No results found.  Scheduled Meds: . allopurinol  100 mg Oral Daily  . amLODipine  10 mg Oral Daily  . cinacalcet  30 mg Oral Q breakfast  . cloNIDine  0.3 mg Oral TID  . enoxaparin (LOVENOX) injection  1 mg/kg Subcutaneous Q24H  . hydrALAZINE  100 mg Oral 3 times per day  . isosorbide dinitrate  40 mg Oral 3 times per day  . lisinopril  20 mg Oral Daily  . minoxidil  2.5 mg Oral Daily  . NIFEdipine  60 mg Oral Daily  . pantoprazole  40 mg Oral Daily  . predniSONE  2.5 mg Oral BID WC  . sevelamer carbonate  1,600 mg Oral TID WC  . sodium chloride  3 mL Intravenous Q12H  . tacrolimus  5 mg Oral BID  . Warfarin - Pharmacist Dosing Inpatient   Does not apply q1800   Continuous Infusions:   Time spent: 25 minutes  Eulogio Bear  [facility] Pager [phone]. If 7PM-7AM, please contact night-coverage at [email or web address], password [email or web address] 05/10/2014, 7:52 AM  LOS: 2 days

## 2014-05-10 NOTE — Progress Notes (Signed)
Report called to RN on 6E. Pt belongings brought to 6E.

## 2014-05-10 NOTE — Progress Notes (Signed)
Report given to HDU RN. Pt informed that HD will be up to take to HDU soon.

## 2014-05-10 NOTE — Progress Notes (Signed)
Received orders to transfer pt. Pt aware of transfer. Pt currently off unit at HD. Awaiting bed assignment.

## 2014-05-10 NOTE — Progress Notes (Signed)
ANTICOAGULATION CONSULT NOTE - Follow Up Consult  Pharmacy Consult for lovenox and coumadin Indication: LUL pulmonary embolism    Allergies  Allergen Reactions  . Ferrlecit [Na Ferric Gluc Cplx In Sucrose] Shortness Of Breath, Swelling and Other (See Comments)    Swelling in throat  . Darvocet [Propoxyphene N-Acetaminophen] Hives    Patient Measurements: Height: _0  (188 cm) Weight: 168 lb 14 oz (76.6 kg) (weighed in bed stood to scale 78.2 kg ) IBW/kg (Calculated) : 82.2   Vital Signs: Temp: 97.5 F (36.4 C) (10/20 1218) Temp Source: Oral (10/20 1218) BP: 162/95 mmHg (10/20 1218) Pulse Rate: 85 (10/20 1218)  Labs:  Recent Labs  05/08/14 0015 05/08/14 0212 05/08/14 1600 05/09/14 0252 05/10/14 0211 05/10/14 0830  HGB 9.1*  --   --  8.3*  --   --   HCT 29.0*  --   --  25.7*  --   --   PLT 142*  --   --  141*  --   --   LABPROT  --   --  16.3* 17.1* 19.9*  --   INR  --   --  1.30 1.38 1.68*  --   CREATININE 14.52*  --   --  15.96*  --  17.29*  TROPONINI  --  <0.30  --   --   --   --     Estimated Creatinine Clearance: 5.5 ml/min (by C-G formula based on Cr of 17.29).  Assessment: Patient is a 48 y.o M on coumadin and lovenox overlap day #3 of 5 days minimum for new PE.  INR is subtherapeutic but now trending up towards therapeutic range.  No bleeding documented.  Goal of Therapy:  INR 2-3 Monitor platelets by anticoagulation protocol: Yes   Plan:  - repeat coumadin 62m PO x1 today - continue lovenox 884mSQ q24h  [place] Cost P 05/10/2014,1:13 PM

## 2014-05-10 NOTE — Progress Notes (Signed)
  [facility] Progress Note   Subjective: Feeling better  Filed Vitals:   05/10/14 0818 05/10/14 0830 05/10/14 0900 05/10/14 0930  BP: 151/88 155/90 155/104 157/98  Pulse: 74 75 77 78  Temp:      TempSrc:      Resp:      Height:      Weight:      SpO2:       Exam: Alert, no distress Chest is clear bilat RRR no MRG Abd soft, NTND No LE edema R IJ tunneled HD cath Neuro is nf, Ox3  HD: [facility] 4h   EDW 165 lbs = 75.0kg  [182 (82.7kg) > 175 (79.5kg) last Rx Sat ] 350/700  F200  2/2.5 Bath TDC  Hep 5700 bolus then 800/hr Aranesp 120/ Thurs, Hectorol 4 ug TIW Cath flo placed in cath after HD on 10/17       Assessment: 1 Acute PE - on Lovenox, coumadin 2 ESRD on HD - planning to go back to [place] soon 3 HTN uncontrolled / vol excess- HD w max UF today 4 Anemia Hb 8.3 cont aranesp 120 q week 5 MBD cont vit D, binders, sensipar 6 Hx failed renal Tx   Plan- HD today, get vol down, cont ESA    Kelly Splinter MD  pager [phone]    cell [phone]  05/10/2014, 10:05 AM     Recent Labs Lab 05/08/14 0015 05/09/14 0252 05/10/14 0830  NA 140 134* 133*  K 4.8 5.6* 5.9*  CL 100 94* 94*  CO2 _0 GLUCOSE 100* 99 153*  BUN 72* 79* 86*  CREATININE 14.52* 15.96* 17.29*  CALCIUM 8.3* 8.5 8.4    Recent Labs Lab 05/05/14 0346  AST 26  ALT 20  ALKPHOS 70  BILITOT 0.3  PROT 6.6  ALBUMIN 2.7*    Recent Labs Lab 05/05/14 0346 05/06/14 0323 05/08/14 0015 05/09/14 0252  WBC 6.8 6.2 5.6 5.4  NEUTROABS 4.0 3.9 3.5  --   HGB 9.3* 8.9* 9.1* 8.3*  HCT 29.7* 27.9* 29.0* 25.7*  MCV 74.4* 72.3* 74.2* 73.2*  PLT 158 151 142* 141*   . allopurinol  100 mg Oral Daily  . amLODipine  10 mg Oral Daily  . cinacalcet  30 mg Oral Q breakfast  . cloNIDine  0.3 mg Oral TID  . enoxaparin (LOVENOX) injection  1 mg/kg Subcutaneous Q24H  . hydrALAZINE  100 mg Oral 3 times per day  . isosorbide dinitrate  40 mg Oral 3 times per day  . lisinopril  20  mg Oral Daily  . minoxidil  2.5 mg Oral Daily  . NIFEdipine  60 mg Oral Daily  . pantoprazole  40 mg Oral Daily  . predniSONE  2.5 mg Oral BID WC  . sevelamer carbonate  1,600 mg Oral TID WC  . sodium chloride  3 mL Intravenous Q12H  . tacrolimus  5 mg Oral BID  . Warfarin - Pharmacist Dosing Inpatient   Does not apply q1800     sodium chloride, sodium chloride, sodium chloride, acetaminophen, acetaminophen, alteplase, diazepam, feeding supplement (NEPRO CARB STEADY), heparin, [START ON 05/11/2014] heparin, hydrALAZINE, lidocaine (PF), lidocaine-prilocaine, nitroGLYCERIN, ondansetron (ZOFRAN) IV, ondansetron, oxyCODONE, pentafluoroprop-tetrafluoroeth, sodium chloride, zolpidem

## 2014-05-11 DIAGNOSIS — N179 Acute kidney failure, unspecified: Secondary | ICD-10-CM

## 2014-05-11 DIAGNOSIS — N189 Chronic kidney disease, unspecified: Secondary | ICD-10-CM

## 2014-05-11 LAB — PROTIME-INR
INR: 2.73 — ABNORMAL HIGH (ref 0.00–1.49)
Prothrombin Time: 29.2 seconds — ABNORMAL HIGH (ref 11.6–15.2)

## 2014-05-11 LAB — RENAL FUNCTION PANEL
Albumin: 2.5 g/dL — ABNORMAL LOW (ref 3.5–5.2)
Anion gap: 12 (ref 5–15)
BUN: 40 mg/dL — AB (ref 6–23)
CALCIUM: 8.8 mg/dL (ref 8.4–10.5)
CO2: 26 mEq/L (ref 19–32)
Chloride: 100 mEq/L (ref 96–112)
Creatinine, Ser: 10.76 mg/dL — ABNORMAL HIGH (ref 0.50–1.35)
GFR calc Af Amer: 6 mL/min — ABNORMAL LOW (ref >90)
GFR calc non Af Amer: 5 mL/min — ABNORMAL LOW (ref >90)
GLUCOSE: 131 mg/dL — AB (ref 70–99)
Phosphorus: 4.6 mg/dL (ref 2.3–4.6)
Potassium: 4.9 mEq/L (ref 3.7–5.3)
Sodium: 138 mEq/L (ref 137–147)

## 2014-05-11 NOTE — Progress Notes (Signed)
[facility] Progress Note   Subjective: breathing better with volume down  Filed Vitals:   05/10/14 2023 05/11/14 0525 05/11/14 1110 05/11/14 1444  BP: 136/84 151/98 152/98 140/91  Pulse: 91 81 88 89  Temp: 98.7 F (37.1 C) 97.4 F (36.3 C) 98.5 F (36.9 C)   TempSrc: Oral Oral Oral   Resp: _0 Height:      Weight:      SpO2: 99% 99% 99%    Exam: Alert, no distress Chest is clear bilat RRR no MRG Abd soft, NTND No LE edema R IJ tunneled HD cath Neuro is nf, Ox3  HD: [facility] 4h   EDW 165 lbs = 75.0kg  [182 (82.7kg) > 175 (79.5kg) last Rx Sat ] 350/700  F200  2/2.5 Bath TDC  Hep 5700 bolus then 800/hr Aranesp 120/ Thurs, Hectorol 4 ug TIW Cath flo placed in cath after HD on 10/17       Assessment: 1 Acute PE - on Lovenox, coumadin 2 ESRD on HD - planning to go back to PD soon 3 HTN uncontrolled / vol excess- improved 4 Anemia Hb 8.3 cont aranesp 120 q week 5 MBD cont vit D, binders, sensipar 6 Hx failed renal Tx   Plan- HD in am, ordered LE venous dopplers, cont to lower vol for HTN control    Kelly Splinter MD  pager [phone]    cell [phone]  05/11/2014, 3:45 PM     Recent Labs Lab 05/09/14 0252 05/10/14 0830 05/11/14 0949  NA 134* 133* 138  K 5.6* 5.9* 4.9  CL 94* 94* 100  CO2 _1 GLUCOSE 99 153* 131*  BUN 79* 86* 40*  CREATININE 15.96* 17.29* 10.76*  CALCIUM 8.5 8.4 8.8  PHOS  --   --  4.6    Recent Labs Lab 05/05/14 0346 05/11/14 0949  AST 26  --   ALT 20  --   ALKPHOS 70  --   BILITOT 0.3  --   PROT 6.6  --   ALBUMIN 2.7* 2.5*    Recent Labs Lab 05/05/14 0346 05/06/14 0323 05/08/14 0015 05/09/14 0252  WBC 6.8 6.2 5.6 5.4  NEUTROABS 4.0 3.9 3.5  --   HGB 9.3* 8.9* 9.1* 8.3*  HCT 29.7* 27.9* 29.0* 25.7*  MCV 74.4* 72.3* 74.2* 73.2*  PLT 158 151 142* 141*   . allopurinol  100 mg Oral Daily  . cinacalcet  30 mg Oral Q breakfast  . cloNIDine  0.3 mg Oral TID  . [START ON  05/12/2014] darbepoetin (ARANESP) injection - DIALYSIS  120 mcg Intravenous Q Thu-HD  . enoxaparin (LOVENOX) injection  1 mg/kg Subcutaneous Q24H  . hydrALAZINE  100 mg Oral 3 times per day  . isosorbide dinitrate  40 mg Oral 3 times per day  . lisinopril  20 mg Oral Daily  . minoxidil  2.5 mg Oral Daily  . NIFEdipine  60 mg Oral Daily  . pantoprazole  40 mg Oral Daily  . predniSONE  2.5 mg Oral BID WC  . sevelamer carbonate  1,600 mg Oral TID WC  . sodium chloride  3 mL Intravenous Q12H  . tacrolimus  5 mg Oral BID  . Warfarin - Pharmacist Dosing Inpatient   Does not apply q1800     sodium chloride, sodium chloride, sodium chloride, acetaminophen, acetaminophen, diazepam, feeding supplement (NEPRO CARB STEADY), heparin, heparin, hydrALAZINE, lidocaine (PF), lidocaine-prilocaine, nitroGLYCERIN, ondansetron (ZOFRAN) IV, ondansetron, oxyCODONE, pentafluoroprop-tetrafluoroeth,  sodium chloride, zolpidem

## 2014-05-11 NOTE — Progress Notes (Signed)
CARE MANAGEMENT NOTE 05/11/2014  Patient:  Frank [name], Frank [name]   Account Number:  [id number]  Date Initiated:  05/09/2014  Documentation initiated by:  Marvetta Gibbons  Subjective/Objective Assessment:   Pt admitted with positive PE     Action/Plan:   PTA pt lived at home home, hx of HD-T/TH/SAT   Anticipated DC Date:  05/12/2014   Anticipated DC Plan:  [place]  CM consult  [facility]      Choice offered to / List presented to:             Status of service:  Completed, signed off Medicare Important Message given?  YES (If response is "NO", the following Medicare IM given date fields will be blank) Date Medicare IM given:  05/11/2014 Medicare IM given by:  Lizabeth Leyden Date Additional Medicare IM given:   Additional Medicare IM given by:    Discharge Disposition:    Per UR Regulation:  Reviewed for med. necessity/level of care/duration of stay  If discussed at [place] of Stay Meetings, dates discussed:    Comments:  05/11/2014 [place], [place] Met with patient regarding appointment at [facility], explained for f/u iwth MD and INR. He verbalized understanding of importance of keeping appointment  05/11/2014  [place], [facility]:  appointment  Friday 05/13/2014 9:00 am  post hospital discharge f/u and INR check.  05/09/14- 1500- Marvetta Gibbons RN, BSN [phone] Referral for PCP and coumadin check- spoke with pt at bedside- per conversation pt states that he has a renal MD at [facility]- does not have a PCP- discussed different clinic options - pt open to being set up at [facility] for INR check and PCP- will also give pt Health Connect # for future reference in case he wants to change PCP- he does state that he has both Medicare and Medicaid- and that he is not assigned a PCP on his Medicaid card so he is open to go to whoever he chooses for a PCP- will arrange f/u appointment with [facility] once d/c date  known.

## 2014-05-11 NOTE — Progress Notes (Signed)
[facility] PROGRESS NOTE  Frank [name] PXT:[id number] DOB: July 10, 1966 DOA: 05/08/2014 PCP: Lolita Patella, MD  Assessment/Plan:   Acute Pulmonary embolism - continue lovenox and coumadin Day 4/5 of bridge per Pharmacy - INR 2.7 today - Will DC home after HD tomorrow, with Close INR check Post DC - Not a candidate for NoAc due to ESRD.  - Needs PCP /CM following    ESRD on dialysis-  - on HD Tues, Thurs, Sat in [employer], nephro: [name].  - Renal following    Hypertensive urgency:  -BP improved, continue hydralazine to 100 tid, procardia xl to 60 and clonidine, lisinopril -will defer to Renal regarding complex antihypertensive regimen esp given h/o noncompliance    Anemia of renal disease  -stable    Generalized anxiety disorder:  -valium to bid PRN  DVT proph: lovenox/coumadin  Code Status:  full Family Communication:   Disposition Plan:  tx to med surg  Consultants:  Nephrology  HPI/Subjective: Feels better, breathing   Objective: Filed Vitals:   05/11/14 1110  BP: 152/98  Pulse: 88  Temp: 98.5 F (36.9 C)  Resp: 21    Intake/Output Summary (Last 24 hours) at 05/11/14 1314 Last data filed at 05/11/14 0945  Gross per 24 hour  Intake    603 ml  Output      0 ml  Net    603 ml   Filed Weights   05/08/14 1545 05/10/14 0800 05/10/14 1218  Weight: 80.1 kg (176 lb 9.4 oz) 81.9 kg (180 lb 8.9 oz) 76.6 kg (168 lb 14 oz)    Exam:   General:  Awake, alert, AAOx3  Cardiovascular: RRR no m/r/g  Respiratory: CTAD  Abdomen: Soft, NT, ND, BS present  Ext: trace edema, no c/c  Basic Metabolic Panel:  Recent Labs Lab 05/06/14 0323 05/08/14 0015 05/09/14 0252 05/10/14 0830 05/11/14 0949  NA 137 140 134* 133* 138  K 6.0* 4.8 5.6* 5.9* 4.9  CL 100 100 94* 94* 100  CO2 18* _0 GLUCOSE 86 100* 99 153* 131*  BUN 82* 72* 79* 86* 40*  CREATININE 16.12* 14.52* 15.96* 17.29* 10.76*  CALCIUM 8.6 8.3* 8.5 8.4 8.8  PHOS  --   --   --    --  4.6   Liver Function Tests:  Recent Labs Lab 05/05/14 0346 05/11/14 0949  AST 26  --   ALT 20  --   ALKPHOS 70  --   BILITOT 0.3  --   PROT 6.6  --   ALBUMIN 2.7* 2.5*   No results found for this basename: LIPASE, AMYLASE,  in the last 168 hours No results found for this basename: AMMONIA,  in the last 168 hours CBC:  Recent Labs Lab 05/05/14 0346 05/06/14 0323 05/08/14 0015 05/09/14 0252  WBC 6.8 6.2 5.6 5.4  NEUTROABS 4.0 3.9 3.5  --   HGB 9.3* 8.9* 9.1* 8.3*  HCT 29.7* 27.9* 29.0* 25.7*  MCV 74.4* 72.3* 74.2* 73.2*  PLT 158 151 142* 141*   Cardiac Enzymes:  Recent Labs Lab 05/08/14 0212  TROPONINI <0.30   BNP (last 3 results)  Recent Labs  01/02/14 0317 01/18/14 0048 04/15/14 0809  PROBNP >70000.0* >70000.0* >70000.0*   CBG: No results found for this basename: GLUCAP,  in the last 168 hours  Recent Results (from the past 240 hour(s))  RAPID STREP SCREEN     Status: None   Collection Time    05/05/14  4:20 AM  Result Value Ref Range Status   Streptococcus, Group A Screen (Direct) NEGATIVE  NEGATIVE Final   Comment: (NOTE)     A Rapid Antigen test may result negative if the antigen level in the     sample is below the detection level of this test. The FDA has not     cleared this test as a stand-alone test therefore the rapid antigen     negative result has reflexed to a Group A Strep culture.  CULTURE, GROUP A STREP     Status: None   Collection Time    05/05/14  4:20 AM      Result Value Ref Range Status   Specimen Description THROAT   Final   Special Requests NONE   Final   Culture     Final   Value: No Beta Hemolytic Streptococci Isolated     Performed at [facility]   Report Status 05/07/2014 FINAL   Final  MRSA PCR SCREENING     Status: None   Collection Time    05/08/14  4:07 PM      Result Value Ref Range Status   MRSA by PCR NEGATIVE  NEGATIVE Final   Comment:            The GeneXpert MRSA Assay (FDA      approved for NASAL specimens     only), is one component of a     comprehensive MRSA colonization     surveillance program. It is not     intended to diagnose MRSA     infection nor to guide or     monitor treatment for     MRSA infections.     Studies: No results found.  Scheduled Meds: . allopurinol  100 mg Oral Daily  . amLODipine  10 mg Oral Daily  . cinacalcet  30 mg Oral Q breakfast  . cloNIDine  0.3 mg Oral TID  . [START ON 05/12/2014] darbepoetin (ARANESP) injection - DIALYSIS  120 mcg Intravenous Q Thu-HD  . enoxaparin (LOVENOX) injection  1 mg/kg Subcutaneous Q24H  . hydrALAZINE  100 mg Oral 3 times per day  . isosorbide dinitrate  40 mg Oral 3 times per day  . lisinopril  20 mg Oral Daily  . minoxidil  2.5 mg Oral Daily  . NIFEdipine  60 mg Oral Daily  . pantoprazole  40 mg Oral Daily  . predniSONE  2.5 mg Oral BID WC  . sevelamer carbonate  1,600 mg Oral TID WC  . sodium chloride  3 mL Intravenous Q12H  . tacrolimus  5 mg Oral BID  . Warfarin - Pharmacist Dosing Inpatient   Does not apply q1800   Continuous Infusions:   Time spent: 25 minutes  [place] [facility] Pager [phone]. If 7PM-7AM, please contact night-coverage at [email or web address], password [facility] 05/11/2014, 1:14 PM  LOS: 3 days

## 2014-05-11 NOTE — Plan of Care (Signed)
Problem: Discharge Progression Outcomes Goal: Discharge plan in place and appropriate Outcome: Progressing Pt. Understands discharge plan and verbalized teach back

## 2014-05-11 NOTE — Progress Notes (Signed)
Patient bed not in lowest position.  Patient asked if bed could be lowered, but stated that he would like it up.  Educated patient on fall precautions, verbalized understanding. Will continue to monitor.

## 2014-05-11 NOTE — Progress Notes (Signed)
ANTICOAGULATION CONSULT NOTE - Follow Up Consult  Pharmacy Consult for lovenox and coumadin Indication: LUL pulmonary embolism    Allergies  Allergen Reactions  . Ferrlecit [Na Ferric Gluc Cplx In Sucrose] Shortness Of Breath, Swelling and Other (See Comments)    Swelling in throat  . Darvocet [Propoxyphene N-Acetaminophen] Hives    Patient Measurements: Height: _0  (188 cm) Weight: 168 lb 14 oz (76.6 kg) (weighed in bed stood to scale 78.2 kg ) IBW/kg (Calculated) : 82.2  Vital Signs: Temp: 97.4 F (36.3 C) (10/21 0525) Temp Source: Oral (10/21 0525) BP: 151/98 mmHg (10/21 0525) Pulse Rate: 81 (10/21 0525)  Labs:  Recent Labs  05/09/14 0252 05/10/14 0211 05/10/14 0830 05/11/14 0444  HGB 8.3*  --   --   --   HCT 25.7*  --   --   --   PLT 141*  --   --   --   LABPROT 17.1* 19.9*  --  29.2*  INR 1.38 1.68*  --  2.73*  CREATININE 15.96*  --  17.29*  --     Estimated Creatinine Clearance: 5.5 ml/min (by C-G formula based on Cr of 17.29).    Assessment: Patient is a 48 y.o M on anticoagulation overlap day #4 of 5 days minimum for new LUL PE.  INR is therapeutic this morning but increased sharply from 1.68 to 2.74.  No bleeding documented.   Goal of Therapy:  INR 2-3 Anti-Xa level 0.6-1 units/ml 4hrs after LMWH dose given Monitor platelets by anticoagulation protocol: Yes   Plan:  - hold coumadin today - continue lovenox 68m SQ q24h to complete 5 days of overlap  Roshelle Traub P 05/11/2014,8:55 AM

## 2014-05-12 ENCOUNTER — Inpatient Hospital Stay (HOSPITAL_COMMUNITY): Payer: Medicare Other

## 2014-05-12 DIAGNOSIS — I2699 Other pulmonary embolism without acute cor pulmonale: Secondary | ICD-10-CM

## 2014-05-12 DIAGNOSIS — J9601 Acute respiratory failure with hypoxia: Secondary | ICD-10-CM

## 2014-05-12 LAB — RENAL FUNCTION PANEL
ALBUMIN: 2.7 g/dL — AB (ref 3.5–5.2)
Anion gap: 13 (ref 5–15)
BUN: 47 mg/dL — ABNORMAL HIGH (ref 6–23)
CO2: 25 mEq/L (ref 19–32)
CREATININE: 12.36 mg/dL — AB (ref 0.50–1.35)
Calcium: 8.9 mg/dL (ref 8.4–10.5)
Chloride: 97 mEq/L (ref 96–112)
GFR calc Af Amer: 5 mL/min — ABNORMAL LOW (ref >90)
GFR calc non Af Amer: 4 mL/min — ABNORMAL LOW (ref >90)
GLUCOSE: 88 mg/dL (ref 70–99)
PHOSPHORUS: 4.7 mg/dL — AB (ref 2.3–4.6)
POTASSIUM: 6.1 meq/L — AB (ref 3.7–5.3)
Sodium: 135 mEq/L — ABNORMAL LOW (ref 137–147)

## 2014-05-12 LAB — CBC
HCT: 29.9 % — ABNORMAL LOW (ref 39.0–52.0)
HEMOGLOBIN: 9.6 g/dL — AB (ref 13.0–17.0)
MCH: 24 pg — AB (ref 26.0–34.0)
MCHC: 32.1 g/dL (ref 30.0–36.0)
MCV: 74.8 fL — AB (ref 78.0–100.0)
Platelets: 203 10*3/uL (ref 150–400)
RBC: 4 MIL/uL — ABNORMAL LOW (ref 4.22–5.81)
RDW: 18.7 % — AB (ref 11.5–15.5)
WBC: 5.9 10*3/uL (ref 4.0–10.5)

## 2014-05-12 LAB — BASIC METABOLIC PANEL
ANION GAP: 12 (ref 5–15)
BUN: 15 mg/dL (ref 6–23)
CHLORIDE: 97 meq/L (ref 96–112)
CO2: 27 meq/L (ref 19–32)
CREATININE: 5.83 mg/dL — AB (ref 0.50–1.35)
Calcium: 8.6 mg/dL (ref 8.4–10.5)
GFR calc Af Amer: 12 mL/min — ABNORMAL LOW (ref >90)
GFR calc non Af Amer: 10 mL/min — ABNORMAL LOW (ref >90)
Glucose, Bld: 76 mg/dL (ref 70–99)
POTASSIUM: 4.6 meq/L (ref 3.7–5.3)
Sodium: 136 mEq/L — ABNORMAL LOW (ref 137–147)

## 2014-05-12 LAB — PROTIME-INR
INR: 2.83 — ABNORMAL HIGH (ref 0.00–1.49)
Prothrombin Time: 30 seconds — ABNORMAL HIGH (ref 11.6–15.2)

## 2014-05-12 LAB — HEPATITIS B SURFACE ANTIGEN: Hepatitis B Surface Ag: NEGATIVE

## 2014-05-12 MED ORDER — DIAZEPAM 5 MG PO TABS
ORAL_TABLET | ORAL | Status: AC
  Filled 2014-05-12: qty 1

## 2014-05-12 MED ORDER — LIDOCAINE HCL (PF) 1 % IJ SOLN: 5.0000 mL | INTRAMUSCULAR | Status: DC | PRN

## 2014-05-12 MED ORDER — LIDOCAINE-PRILOCAINE 2.5-2.5 % EX CREA: 1.0000 "application " | TOPICAL_CREAM | CUTANEOUS | Status: DC | PRN

## 2014-05-12 MED ORDER — WARFARIN SODIUM 5 MG PO TABS: 5.0000 mg | ORAL_TABLET | Freq: Once | ORAL | Status: DC

## 2014-05-12 MED ORDER — HEPARIN SODIUM (PORCINE) 1000 UNIT/ML DIALYSIS: 1000.0000 [IU] | INTRAMUSCULAR | Status: DC | PRN

## 2014-05-12 MED ORDER — PENTAFLUOROPROP-TETRAFLUOROETH EX AERO: 1.0000 "application " | INHALATION_SPRAY | CUTANEOUS | Status: DC | PRN

## 2014-05-12 MED ORDER — ALTEPLASE 2 MG IJ SOLR
2.0000 mg | Freq: Once | INTRAMUSCULAR | Status: DC | PRN
  Filled 2014-05-12: qty 2

## 2014-05-12 MED ORDER — OXYCODONE HCL 5 MG PO TABS
ORAL_TABLET | ORAL | Status: AC
  Filled 2014-05-12: qty 1

## 2014-05-12 MED ORDER — DARBEPOETIN ALFA-POLYSORBATE 60 MCG/0.3ML IJ SOLN
INTRAMUSCULAR | Status: AC
  Administered 2014-05-12: 120 ug
  Filled 2014-05-12: qty 0.6

## 2014-05-12 MED ORDER — IOHEXOL 300 MG/ML  SOLN
25.0000 mL | INTRAMUSCULAR | Status: AC
  Administered 2014-05-12: 25 mL via ORAL

## 2014-05-12 MED ORDER — FAMOTIDINE 20 MG PO TABS
20.0000 mg | ORAL_TABLET | Freq: Two times a day (BID) | ORAL | Status: DC | PRN
  Administered 2014-05-12: 20 mg via ORAL
  Filled 2014-05-12: qty 1

## 2014-05-12 MED ORDER — HEPARIN SODIUM (PORCINE) 1000 UNIT/ML DIALYSIS: 4000.0000 [IU] | INTRAMUSCULAR | Status: DC | PRN

## 2014-05-12 MED ORDER — ONDANSETRON HCL 4 MG/2ML IJ SOLN
INTRAMUSCULAR | Status: AC
  Filled 2014-05-12: qty 2

## 2014-05-12 MED ORDER — WARFARIN SODIUM 5 MG PO TABS
5.0000 mg | ORAL_TABLET | Freq: Once | ORAL | Status: AC
  Administered 2014-05-12: 5 mg via ORAL
  Filled 2014-05-12: qty 1

## 2014-05-12 MED ORDER — HYDROMORPHONE HCL 1 MG/ML IJ SOLN
INTRAMUSCULAR | Status: AC
  Filled 2014-05-12: qty 1

## 2014-05-12 MED ORDER — SODIUM CHLORIDE 0.9 % IV SOLN: 100.0000 mL | INTRAVENOUS | Status: DC | PRN

## 2014-05-12 MED ORDER — POLYETHYLENE GLYCOL 3350 17 G PO PACK: 17.0000 g | PACK | Freq: Every day | ORAL | Status: DC

## 2014-05-12 MED ORDER — TRAMADOL HCL 50 MG PO TABS
100.0000 mg | ORAL_TABLET | Freq: Four times a day (QID) | ORAL | Status: AC | PRN
  Administered 2014-05-12 – 2014-05-13 (×2): 100 mg via ORAL
  Filled 2014-05-12 (×2): qty 2

## 2014-05-12 MED ORDER — HYDROMORPHONE HCL 1 MG/ML IJ SOLN
1.0000 mg | Freq: Once | INTRAMUSCULAR | Status: AC
  Administered 2014-05-12: 1 mg via INTRAVENOUS

## 2014-05-12 MED ORDER — NEPRO/CARBSTEADY PO LIQD: 237.0000 mL | ORAL | Status: DC | PRN

## 2014-05-12 NOTE — Progress Notes (Signed)
05/12/2014 1550 NCM contacted [place] and left message on voicemail. INR needed for 05/16/2014. Spoke to [facility] rep and will verify appt for tomorrow. Made aware pt needs INR on Monday. Jonnie Finner RN CCM Case Mgmt phone [phone]

## 2014-05-12 NOTE — Progress Notes (Signed)
[facility] Progress Note   Subjective: 4kg up overnight, having abd pain on HD today, L mid abd, steady no diarrhea. Also was up "all night" trying to have a BM. Feels his abd is distended and wants extra HD time to get more fluid off.   Filed Vitals:   05/12/14 0930 05/12/14 0958 05/12/14 1030 05/12/14 1100  BP: 201/119 188/119 177/121 184/106  Pulse: 99 92 95 92  Temp:      TempSrc:      Resp:  20 20   Height:      Weight:      SpO2:       Exam: Alert, no distress Chest is clear bilat RRR no MRG Abd soft, NTND No LE edema R IJ tunneled HD cath Neuro is nf, Ox3  HD: [facility] 4h   EDW 165 lbs = 75.0kg  [182 (82.7kg) > 175 (79.5kg) last Rx Sat ] 350/700  F200  2/2.5 Bath TDC  Hep 5700 bolus then 800/hr Aranesp 120/ Thurs, Hectorol 4 ug TIW Cath flo placed in cath after HD on 10/17       Assessment: 1 Acute PE - on Lovenox, coumadin 2 ESRD on HD - planning to go back to [place] soon 3 HTN uncontrolled / vol excess- discussed high wt gains with pt and explained importance of fluid/Na restriction 4 Anemia Hb 8.3 cont aranesp 120 q week 5 MBD cont vit D, binders, sensipar 6 Hx failed renal Tx  7 Hyperkalemia- resume renal diet, HD today  Plan- HD in am, LE dopplers pending, cont to lower vol for HTN control, KUB planned and possible CT for abd pain    Kelly Splinter MD  pager [phone]    cell [phone]  05/12/2014, 11:13 AM     Recent Labs Lab 05/10/14 0830 05/11/14 0949 05/12/14 0658  NA 133* 138 135*  K 5.9* 4.9 6.1*  CL 94* 100 97  CO2 _0 GLUCOSE 153* 131* 88  BUN 86* 40* 47*  CREATININE 17.29* 10.76* 12.36*  CALCIUM 8.4 8.8 8.9  PHOS  --  4.6 4.7*    Recent Labs Lab 05/11/14 0949 05/12/14 0658  ALBUMIN 2.5* 2.7*    Recent Labs Lab 05/06/14 0323 05/08/14 0015 05/09/14 0252 05/12/14 0658  WBC 6.2 5.6 5.4 5.9  NEUTROABS 3.9 3.5  --   --   HGB 8.9* 9.1* 8.3* 9.6*  HCT 27.9* 29.0* 25.7* 29.9*  MCV 72.3*  74.2* 73.2* 74.8*  PLT 151 142* 141* 203   . allopurinol  100 mg Oral Daily  . cinacalcet  30 mg Oral Q breakfast  . cloNIDine  0.3 mg Oral TID  . darbepoetin (ARANESP) injection - DIALYSIS  120 mcg Intravenous Q Thu-HD  . enoxaparin (LOVENOX) injection  1 mg/kg Subcutaneous Q24H  . hydrALAZINE  100 mg Oral 3 times per day  . isosorbide dinitrate  40 mg Oral 3 times per day  . lisinopril  20 mg Oral Daily  . minoxidil  2.5 mg Oral Daily  . NIFEdipine  60 mg Oral Daily  . pantoprazole  40 mg Oral Daily  . predniSONE  2.5 mg Oral BID WC  . sevelamer carbonate  1,600 mg Oral TID WC  . sodium chloride  3 mL Intravenous Q12H  . tacrolimus  5 mg Oral BID  . Warfarin - Pharmacist Dosing Inpatient   Does not apply q1800     sodium chloride, sodium chloride, sodium chloride, acetaminophen, acetaminophen,  alteplase, diazepam, famotidine, feeding supplement (NEPRO CARB STEADY), heparin, heparin, hydrALAZINE, lidocaine (PF), lidocaine-prilocaine, nitroGLYCERIN, ondansetron (ZOFRAN) IV, ondansetron, oxyCODONE, pentafluoroprop-tetrafluoroeth, sodium chloride, zolpidem

## 2014-05-12 NOTE — Progress Notes (Signed)
*  PRELIMINARY RESULTS* Vascular Ultrasound Lower extremity venous duplex has been completed.  Preliminary findings: no evidence of DVT.   Landry Mellow, RDMS, RVT  05/12/2014, 3:32 PM

## 2014-05-12 NOTE — Progress Notes (Signed)
CARE MANAGEMENT NOTE 05/12/2014  Patient:  Frank [name], Frank [name]   Account Number:  [id number]  Date Initiated:  05/09/2014  Documentation initiated by:  Marvetta Gibbons  Subjective/Objective Assessment:   Pt admitted with positive PE     Action/Plan:   PTA pt lived at home home, hx of HD-T/TH/SAT   Anticipated DC Date:  05/12/2014   Anticipated DC Plan:  [place]  CM consult  [facility]      Choice offered to / List presented to:             Status of service:  Completed, signed off Medicare Important Message given?  YES (If response is "NO", the following Medicare IM given date fields will be blank) Date Medicare IM given:  05/11/2014 Medicare IM given by:  Lizabeth Leyden Date Additional Medicare IM given:   Additional Medicare IM given by:    Discharge Disposition:  HOME/SELF CARE  Per UR Regulation:  Reviewed for med. necessity/level of care/duration of stay  If discussed at [place] of Stay Meetings, dates discussed:    Comments:  05/12/2014 1000 NCM contacted [place] and no available appt for Monday. Pt if dc home today can follow up on Friday's appt at 9 am. If dc 05/13/2014, pt's will need an appt for next week. Per [place] rep, Tues is earliest appt but cannot schedule until in am, 10/23. Jonnie Finner RN CCM Case Mgmt phone [phone]  05/11/2014 [place], [place] Met with patient regarding appointment at [facility], explained for f/u iwth MD and INR. He verbalized understanding of importance of keeping appointment  05/11/2014  [place], [place] [facility]:  appointment  Friday 05/13/2014 9:00 am  post hospital discharge f/u and INR check.  05/09/14- 1500- Marvetta Gibbons RN, BSN [phone] Referral for PCP and coumadin check- spoke with pt at bedside- per conversation pt states that he has a renal MD at [name]- does not have a PCP- discussed different clinic options - pt open to being set up at [facility] for INR  check and PCP- will also give pt Health Connect # for future reference in case he wants to change PCP- he does state that he has both Medicare and Medicaid- and that he is not assigned a PCP on his Medicaid card so he is open to go to whoever he chooses for a PCP- will arrange f/u appointment with [facility] once d/c date known.

## 2014-05-12 NOTE — Progress Notes (Signed)
[facility] PROGRESS NOTE  Frank [name] YYP:[id number] DOB: October 16, 1965 DOA: 05/08/2014 PCP: Lolita Patella, MD  Assessment/Plan:   Acute Pulmonary embolism - continue lovenox and coumadin Day 5/5 of bridge per Pharmacy - INR 2.8 today, Dc lovenox after todays dose - Not a candidate for NoAc due to ESRD.  - Needs PCP /CM following  Abd pain and tenderness -starting 2am today -Check KUB and possibly Ct given full dose anticoagulation -Hb and WBC stable    ESRD on dialysis-  - on HD Tues, Thurs, Sat in [employer], nephro: [name].  -4kg weight gained from 10/21,  - Renal following    Hypertensive urgency:  -BP improved, continue hydralazine to 100 tid, procardia xl to 60 and clonidine, lisinopril -will defer to Renal regarding complex antihypertensive regimen esp given h/o noncompliance    Anemia of renal disease  -stable    Generalized anxiety disorder:  -valium to bid PRN  DVT proph: lovenox/coumadin  Code Status:  full Family Communication:   Disposition Plan:  Home pending workup of abd issues  Consultants:  Nephrology  HPI/Subjective: Severe L sided abd pain since 2am today  Objective: Filed Vitals:   05/12/14 1221  BP: 167/112  Pulse: 91  Temp: 98.4 F (36.9 C)  Resp: 21    Intake/Output Summary (Last 24 hours) at 05/12/14 1306 Last data filed at 05/12/14 1140  Gross per 24 hour  Intake      0 ml  Output   6490 ml  Net  -6490 ml   Filed Weights   05/11/14 2209 05/12/14 0652 05/12/14 1140  Weight: 77.6 kg (171 lb 1.2 oz) 81 kg (178 lb 9.2 oz) 74 kg (163 lb 2.3 oz)    Exam:   General:  Awake, alert, AAOx3  Cardiovascular: RRR no m/r/g  Respiratory: CTAD  Abdomen: Soft, LUQ tender, ND, BS present  Ext: trace edema, no c/c  Basic Metabolic Panel:  Recent Labs Lab 05/08/14 0015 05/09/14 0252 05/10/14 0830 05/11/14 0949 05/12/14 0658  NA 140 134* 133* 138 135*  K 4.8 5.6* 5.9* 4.9 6.1*  CL 100 94* 94* 100 97  CO2 _0 GLUCOSE 100* 99 153* 131* 88  BUN 72* 79* 86* 40* 47*  CREATININE 14.52* 15.96* 17.29* 10.76* 12.36*  CALCIUM 8.3* 8.5 8.4 8.8 8.9  PHOS  --   --   --  4.6 4.7*   Liver Function Tests:  Recent Labs Lab 05/11/14 0949 05/12/14 0658  ALBUMIN 2.5* 2.7*   No results found for this basename: LIPASE, AMYLASE,  in the last 168 hours No results found for this basename: AMMONIA,  in the last 168 hours CBC:  Recent Labs Lab 05/06/14 0323 05/08/14 0015 05/09/14 0252 05/12/14 0658  WBC 6.2 5.6 5.4 5.9  NEUTROABS 3.9 3.5  --   --   HGB 8.9* 9.1* 8.3* 9.6*  HCT 27.9* 29.0* 25.7* 29.9*  MCV 72.3* 74.2* 73.2* 74.8*  PLT 151 142* 141* 203   Cardiac Enzymes:  Recent Labs Lab 05/08/14 0212  TROPONINI <0.30   BNP (last 3 results)  Recent Labs  01/02/14 0317 01/18/14 0048 04/15/14 0809  PROBNP >70000.0* >70000.0* >70000.0*   CBG: No results found for this basename: GLUCAP,  in the last 168 hours  Recent Results (from the past 240 hour(s))  RAPID STREP SCREEN     Status: None   Collection Time    05/05/14  4:20 AM      Result Value  Ref Range Status   Streptococcus, Group A Screen (Direct) NEGATIVE  NEGATIVE Final   Comment: (NOTE)     A Rapid Antigen test may result negative if the antigen level in the     sample is below the detection level of this test. The FDA has not     cleared this test as a stand-alone test therefore the rapid antigen     negative result has reflexed to a Group A Strep culture.  CULTURE, GROUP A STREP     Status: None   Collection Time    05/05/14  4:20 AM      Result Value Ref Range Status   Specimen Description THROAT   Final   Special Requests NONE   Final   Culture     Final   Value: No Beta Hemolytic Streptococci Isolated     Performed at [facility]   Report Status 05/07/2014 FINAL   Final  MRSA PCR SCREENING     Status: None   Collection Time    05/08/14  4:07 PM      Result Value Ref Range Status   MRSA by PCR  NEGATIVE  NEGATIVE Final   Comment:            The GeneXpert MRSA Assay (FDA     approved for NASAL specimens     only), is one component of a     comprehensive MRSA colonization     surveillance program. It is not     intended to diagnose MRSA     infection nor to guide or     monitor treatment for     MRSA infections.     Studies: No results found.  Scheduled Meds: . allopurinol  100 mg Oral Daily  . cinacalcet  30 mg Oral Q breakfast  . cloNIDine  0.3 mg Oral TID  . darbepoetin (ARANESP) injection - DIALYSIS  120 mcg Intravenous Q Thu-HD  . hydrALAZINE  100 mg Oral 3 times per day  . isosorbide dinitrate  40 mg Oral 3 times per day  . lisinopril  20 mg Oral Daily  . minoxidil  2.5 mg Oral Daily  . NIFEdipine  60 mg Oral Daily  . pantoprazole  40 mg Oral Daily  . predniSONE  2.5 mg Oral BID WC  . sevelamer carbonate  1,600 mg Oral TID WC  . sodium chloride  3 mL Intravenous Q12H  . tacrolimus  5 mg Oral BID  . Warfarin - Pharmacist Dosing Inpatient   Does not apply q1800   Continuous Infusions:   Time spent: 25 minutes  [place] [facility] Pager [phone]. If 7PM-7AM, please contact night-coverage at [email or web address], password [facility] 05/12/2014, 1:06 PM  LOS: 4 days

## 2014-05-12 NOTE — Procedures (Signed)
I was present at this dialysis session, have reviewed the session itself and made  appropriate changes  Kelly Splinter MD (pgr) [phone]    [phone] 05/12/2014, 11:16 AM

## 2014-05-12 NOTE — Discharge Summary (Signed)
Physician Discharge Summary  [name] MQK:[id number] DOB: 1965-10-23 DOA: 05/08/2014  PCP: Lolita Patella, MD  Admit date: 05/08/2014 Discharge date: 05/12/2014  Time spent: 45 minutes  Recommendations for Outpatient Follow-up:  1. [facility] 10/23 2. INR check 10/26  Discharge Diagnoses:  Principal Problem:   Pulmonary embolism Active Problems:   ESRD on dialysis- Tues, Thurs, Sat in [facility], nephro: [name]   Hypertensive urgency   Anemia of renal disease   Generalized anxiety disorder   Discharge Condition: stable  Diet recommendation: Renal  Filed Weights   05/11/14 2209 05/12/14 0652 05/12/14 1140  Weight: 77.6 kg (171 lb 1.2 oz) 81 kg (178 lb 9.2 oz) 74 kg (163 lb 2.3 oz)    History of present illness:  Chief Complaint: SOB and chest and Back Pain  HPI: Frank [name] is a 48 y.o. male with a history of ESRD on HD for 8 years since his renal transplant rejection, also history of Malignant HTN, and DM2, who presents to the ED with complaints of Back and Chest pain x 3 days with increased SOB. He reported having pleuritic Chest pain. He was evaluated in the ED and found to have Pulmonary Embolism of the LUL on CTA of the Chest.   Hospital Course:  Acute Pulmonary embolism  - completed lovenox and coumadin Day 5/5 of bridge per Pharmacy  - INR 2.8 today, Dc lovenox after todays dose  - Not a candidate for NoAc due to ESRD.  - Needs PCP /CM following  -continue coumadin, next INR check on Monday 10/26  Abd pain  -transient resolved at the End of HD today, [facility] benign  ESRD on dialysis-  - on HD Tues, Thurs, Sat in [facility], nephro: [name].  -4kg weight gained from 10/21,  - Renal following   Hypertensive urgency:  -BP improved, continue hydralazine to 100 tid, procardia xl to 60 and clonidine, lisinopril  -will defer to Renal regarding complex antihypertensive regimen esp given h/o noncompliance   Anemia of renal disease  -stable    Generalized anxiety disorder:  -valium to bid PRN   Consultations:  Renal  Discharge Exam: Filed Vitals:   05/12/14 1221  BP: 167/112  Pulse: 91  Temp: 98.4 F (36.9 C)  Resp: 21    General: AAOx3 Cardiovascular: S1S2/RRR Respiratory: CTAB  Discharge Instructions You were cared for by a hospitalist during your hospital stay. If you have any questions about your discharge medications or the care you received while you were in the hospital after you are discharged, you can call the unit and asked to speak with the hospitalist on call if the hospitalist that took care of you is not available. Once you are discharged, your primary care physician will handle any further medical issues. Please note that NO REFILLS for any discharge medications will be authorized once you are discharged, as it is imperative that you return to your primary care physician (or establish a relationship with a primary care physician if you do not have one) for your aftercare needs so that they can reassess your need for medications and monitor your lab values.  Discharge Instructions   Discharge instructions    Complete by:  As directed   Renal Diet     Increase activity slowly    Complete by:  As directed           Current Discharge Medication List    START taking these medications   Details  polyethylene glycol (MIRALAX) packet Take 17 g by mouth daily. For constipation Qty: 14 each, Refills: 0    warfarin (COUMADIN) 5 MG tablet Take 1 tablet (5 mg total) by mouth one time only at 6 PM. Take 50m daily till INR check Monday, then based on that Qty: 30 tablet, Refills: 0      CONTINUE these medications which have NOT CHANGED   Details  acetaminophen (TYLENOL) 325 MG tablet Take 2 tablets (650 mg total) by mouth every 4 (four) hours as needed for mild pain, fever or headache.    allopurinol (ZYLOPRIM) 100 MG tablet Take 100 mg by mouth daily.    amLODipine (NORVASC) 5 MG tablet Take 10 mg by  mouth daily.    cinacalcet (SENSIPAR) 30 MG tablet Take 30 mg by mouth daily.    cloNIDine (CATAPRES) 0.3 MG tablet Take 1 tablet (0.3 mg total) by mouth 3 (three) times daily. Qty: 90 tablet, Refills: 0    diazepam (VALIUM) 5 MG tablet Take 1 tablet (5 mg total) by mouth 2 (two) times daily. Qty: 4 tablet, Refills: 0    furosemide (LASIX) 80 MG tablet Take 80 mg by mouth daily.    hydrALAZINE (APRESOLINE) 25 MG tablet Take 50 mg by mouth 3 (three) times daily.    isosorbide dinitrate (ISORDIL) 40 MG tablet Take 40 mg by mouth every 8 (eight) hours.    lisinopril (PRINIVIL,ZESTRIL) 20 MG tablet Take 20 mg by mouth daily.    minoxidil (LONITEN) 2.5 MG tablet Take 1 tablet (2.5 mg total) by mouth daily. Qty: 30 tablet, Refills: 0    NIFEdipine (PROCARDIA-XL/ADALAT CC) 30 MG 24 hr tablet Take 1 tablet (30 mg total) by mouth daily. Qty: 30 tablet, Refills: 0    omeprazole (PRILOSEC) 20 MG capsule Take 20 mg by mouth daily.    sevelamer carbonate (RENVELA) 800 MG tablet Take 1,600 mg by mouth 3 (three) times daily with meals.    cyclobenzaprine (FLEXERIL) 10 MG tablet Take 1 tablet (10 mg total) by mouth 3 (three) times daily as needed. Qty: 30 tablet, Refills: 0    predniSONE (DELTASONE) 2.5 MG tablet Take 2.5 mg by mouth 2 (two) times daily with a meal.    tacrolimus (PROGRAF) 1 MG capsule Take 5 mg by mouth 2 (two) times daily.    traMADol (ULTRAM) 50 MG tablet Take 2 tablets (100 mg total) by mouth every 6 (six) hours as needed. Qty: 16 tablet, Refills: 0       Allergies  Allergen Reactions  . Ferrlecit [Na Ferric Gluc Cplx In Sucrose] Shortness Of Breath, Swelling and Other (See Comments)    Swelling in throat  . Darvocet [Propoxyphene N-Acetaminophen] Hives   Follow-up Information   Follow up with [facility]   . ( Friday 05/13/2014 @ 9:00 am, MD visit, INR check needed 05/16/2014)    Contact information:   [phone]      The results of significant diagnostics from this hospitalization (including imaging, microbiology, ancillary and laboratory) are listed below for reference.    Significant Diagnostic Studies: Dg Chest 2 View  05/08/2014   CLINICAL DATA:  Shortness of breath.  Initial encounter.  EXAM: CHEST  2 VIEW  COMPARISON:  05/06/2014.  FINDINGS: There is moderate cardiomegaly. Aortic contours are unremarkable and stable.  There is no edema, consolidation, effusion, or pneumothorax.  Stable positioning of dialysis catheter via right IJ approach. The  tip is at the upper right atrium.  IMPRESSION: Cardiomegaly without failure.   Electronically Signed   By: Jorje Guild M.D.   On: 05/08/2014 00:59   Dg Chest 2 View  05/06/2014   CLINICAL DATA:  Initial evaluation for shortness of breath. History of hypertension, end-stage renal disease.  EXAM: CHEST  2 VIEW  COMPARISON:  Prior radiograph from 04/15/2014  FINDINGS: Right-sided hemodialysis catheter is stable. Severe cardiomegaly is grossly unchanged. Mediastinal silhouette within normal limits. Tortuosity intrathoracic aorta noted.  Lungs are normally inflated. There is diffuse pulmonary vascular congestion without overt pulmonary edema. No pleural effusion. No focal infiltrate to suggest acute infectious pneumonitis. There is no pneumothorax.  No acute osseous abnormality.  IMPRESSION: Stable cardiomegaly with mild diffuse pulmonary vascular congestion without overt pulmonary edema.   Electronically Signed   By: Jeannine Boga M.D.   On: 05/06/2014 04:35   Dg Chest 2 View  04/15/2014   CLINICAL DATA:  Shortness of breath for 1 day. The patient missed his dialysis appointment.  EXAM: CHEST  2 VIEW  COMPARISON:  PA and lateral chest 04/05/2014.  FINDINGS: Right IJ approach dialysis catheter remains in place. There is marked cardiomegaly but no edema. Lungs are clear. No pneumothorax or pleural effusion.   IMPRESSION: Cardiomegaly without acute disease.   Electronically Signed   By: Inge Rise M.D.   On: 04/15/2014 07:51   Dg Abd 1 View  05/12/2014   CLINICAL DATA:  Left-sided abdominal pain and bloating beginning last night.  EXAM: ABDOMEN - 1 VIEW  COMPARISON:  CT abdomen and pelvis 12/21/2013. Single view of the abdomen 12/05/2013.  FINDINGS: The bowel gas pattern is normal. No unexpected abdominal calcification is seen. No focal bony abnormality is identified.  IMPRESSION: Negative exam.   Electronically Signed   By: Inge Rise M.D.   On: 05/12/2014 13:13   Ct Soft Tissue Neck W Contrast  05/05/2014   CLINICAL DATA:  Initial evaluation for acute sore throat, difficulty swallowing, difficulty breathing, neck stiffness.  EXAM: CT NECK WITH CONTRAST  TECHNIQUE: Multidetector CT imaging of the neck was performed using the standard protocol following the bolus administration of intravenous contrast.  CONTRAST:  72m OMNIPAQUE IOHEXOL 300 MG/ML  SOLN  COMPARISON:  None available.  FINDINGS: Visualized brain and posterior fossa are within normal limits.  Visualized orbits are unremarkable.  Sequelae of chronic sinus disease seen about the maxillary and sphenoid sinuses. There is moderate mucoperiosteal thickening within the maxillary sinuses bilaterally. Mastoid air cells are underpneumatized.  The salivary glands including the parotid glands and submandibular glands are within normal limits.  Oral cavity is unremarkable without evidence of mass lesion or loculated fluid collection. Palatine tonsils are symmetric without acute abnormality. Parapharyngeal fat is preserved. Oropharynx and nasopharynx within normal limits. No retropharyngeal fluid collection. Hypopharynx and supraglottic larynx are within normal limits. Airway is widely patent. Epiglottis normal. Vallecula is clear. Lingual tonsils within normal limits.  True vocal cords are symmetric bilaterally. Subglottic airway is clear.  Thyroid  gland within normal limits.  No adenopathy identified within the neck. Enlarged mediastinal lymph nodes measuring up to 1.6 cm in short axis seen near the carina (series 6, image 137).  Partially visualized lungs are clear.  Normal intravascular enhancement seen throughout the neck.  No acute osseous abnormality. No worrisome lytic or blastic osseous lesions.  IMPRESSION: 1. No acute abnormality identified within the neck. 2. Enlarged mediastinal lymph nodes measuring up to 1.6 cm in short axis. Findings  are of uncertain etiology, and are incompletely evaluated on this exam. No cervical adenopathy identified.   Electronically Signed   By: Jeannine Boga M.D.   On: 05/05/2014 05:41   Ct Angio Chest Pe W/cm &/or Wo Cm  05/08/2014   CLINICAL DATA:  Right-sided chest pain for 2 days. Initial encounter  EXAM: CT ANGIOGRAPHY CHEST WITH CONTRAST  TECHNIQUE: Multidetector CT imaging of the chest was performed using the standard protocol during bolus administration of intravenous contrast. Multiplanar CT image reconstructions and MIPs were obtained to evaluate the vascular anatomy.  CONTRAST:  123m OMNIPAQUE IOHEXOL 350 MG/ML SOLN  COMPARISON:  None.  FINDINGS: THORACIC INLET/BODY WALL:  Dialysis catheter via right IJ approach. Tip is in the upper right atrium. Low-attenuation along the catheter is discrete, suggestive of fibrin sheath.  MEDIASTINUM:  Acute subsegmental pulmonary embolism to the left upper lobe, apical segment. No definite additional pulmonary embolism is identified. There is poor enhancement of pulmonary arteries in the anterior basal segment left lower lobe and in the lingula, but these contact the left heart and are affected by motion.  Multi chamber cardiomegaly. There is likely a small pleural effusion, although this difficult to differentiate pleural fluid from myocardium due to the contrast timing. No definitive evidence of hemopericardium in this dialysis patient. There is limited  opacification of the systemic arterial tree. No acute aortic findings present.  LUNG WINDOWS:  There is mild diffuse bronchial wall thickening. No edema or pneumonia. No effusion or pneumothorax. Mild atelectasis around the left heart.  UPPER ABDOMEN:  No acute findings.  Advanced bilateral renal atrophy.  OSSEOUS:  No acute fracture.  No suspicious lytic or blastic lesions.  Critical Value/emergent results were called by telephone at the time of interpretation on 05/08/2014 at 4:35 am to Dr. DDelora Fuel, who verbally acknowledged these results.  Review of the MIP images confirms the above findings.  IMPRESSION: 1. Acute subsegmental pulmonary embolism to the left upper lobe. No definite additional embolism, as above. 2. Right IJ dialysis catheter complicated by fibrin sheath.   Electronically Signed   By: JJorje GuildM.D.   On: 05/08/2014 04:37    Microbiology: Recent Results (from the past 240 hour(s))  RAPID STREP SCREEN     Status: None   Collection Time    05/05/14  4:20 AM      Result Value Ref Range Status   Streptococcus, Group A Screen (Direct) NEGATIVE  NEGATIVE Final   Comment: (NOTE)     A Rapid Antigen test may result negative if the antigen level in the     sample is below the detection level of this test. The FDA has not     cleared this test as a stand-alone test therefore the rapid antigen     negative result has reflexed to a Group A Strep culture.  CULTURE, GROUP A STREP     Status: None   Collection Time    05/05/14  4:20 AM      Result Value Ref Range Status   Specimen Description THROAT   Final   Special Requests NONE   Final   Culture     Final   Value: No Beta Hemolytic Streptococci Isolated     [facility]formed at [facility]  Report Status 05/07/2014 FINAL   Final  MRSA PCR SCREENING     Status: None   Collection Time    05/08/14  4:07 PM      Result Value Ref  Range Status   MRSA by PCR NEGATIVE  NEGATIVE Final   Comment:            The GeneXpert MRSA  Assay (FDA     approved for NASAL specimens     only), is one component of a     comprehensive MRSA colonization     surveillance program. It is not     intended to diagnose MRSA     infection nor to guide or     monitor treatment for     MRSA infections.     Labs: Basic Metabolic Panel:  Recent Labs Lab 05/09/14 0252 05/10/14 0830 05/11/14 0949 05/12/14 0658 05/12/14 1342  NA 134* 133* 138 135* 136*  K 5.6* 5.9* 4.9 6.1* 4.6  CL 94* 94* 100 97 97  CO2 _0 GLUCOSE 99 153* 131* 88 76  BUN 79* 86* 40* 47* 15  CREATININE 15.96* 17.29* 10.76* 12.36* 5.83*  CALCIUM 8.5 8.4 8.8 8.9 8.6  PHOS  --   --  4.6 4.7*  --    Liver Function Tests:  Recent Labs Lab 05/11/14 0949 05/12/14 0658  ALBUMIN 2.5* 2.7*   No results found for this basename: LIPASE, AMYLASE,  in the last 168 hours No results found for this basename: AMMONIA,  in the last 168 hours CBC:  Recent Labs Lab 05/06/14 0323 05/08/14 0015 05/09/14 0252 05/12/14 0658  WBC 6.2 5.6 5.4 5.9  NEUTROABS 3.9 3.5  --   --   HGB 8.9* 9.1* 8.3* 9.6*  HCT 27.9* 29.0* 25.7* 29.9*  MCV 72.3* 74.2* 73.2* 74.8*  PLT 151 142* 141* 203   Cardiac Enzymes:  Recent Labs Lab 05/08/14 0212  TROPONINI <0.30   BNP: BNP (last 3 results)  Recent Labs  01/02/14 0317 01/18/14 0048 04/15/14 0809  PROBNP >70000.0* >70000.0* >70000.0*   CBG: No results found for this basename: GLUCAP,  in the last 168 hours     Signed:  Oryn Casanova  [facility] 05/12/2014, 4:05 PM

## 2014-05-12 NOTE — Progress Notes (Signed)
Pt was slated for discharge home today and had a ride to leave. Night RN called this NP stating pt had Percocet at 7pm and wanted more (around 9pm) before he would leave. He was c/o left sided back pain. This NP elected not to give him more pain medication prior to leaving since he just received Percocet at 7pm.   Later, his ride arrived for discharge. RN paged this NP again stating pt refused to go home because we wouldn't give him the pain medication. Then he stated, he would go ahead and have the "CAT scan" from earlier today. NOTE>He wouldn't drink contrast today for a CT abd to investigate his c/o abd pain. Now, he no longer has that pain and wants Percocet for back pain. He is on Ultram at home for pain. Will allow him to have Ultram tonight, but will not be Rx any stronger narcotics since he was not being d/c home on them and has no reason at present to receive any pain meds over what he normally takes at home.   Clance Boll, NP [facility]

## 2014-05-12 NOTE — Progress Notes (Signed)
ANTICOAGULATION CONSULT NOTE - Follow Up Consult  Pharmacy Consult for lovenox and coumadin Indication: LUL pulmonary embolism    Allergies  Allergen Reactions  . Ferrlecit [Na Ferric Gluc Cplx In Sucrose] Shortness Of Breath, Swelling and Other (See Comments)    Swelling in throat  . Darvocet [Propoxyphene N-Acetaminophen] Hives    Patient Measurements: Height: _0  (188 cm) Weight: 163 lb 2.3 oz (74 kg) IBW/kg (Calculated) : 82.2  Vital Signs: Temp: 98.4 F (36.9 C) (10/22 1221) Temp Source: Oral (10/22 1221) BP: 167/112 mmHg (10/22 1221) Pulse Rate: 91 (10/22 1221)  Labs:  Recent Labs  05/10/14 0211 05/10/14 0830 05/11/14 0444 05/11/14 0949 05/12/14 0500 05/12/14 0658  HGB  --   --   --   --   --  9.6*  HCT  --   --   --   --   --  29.9*  PLT  --   --   --   --   --  203  LABPROT 19.9*  --  29.2*  --  30.0*  --   INR 1.68*  --  2.73*  --  2.83*  --   CREATININE  --  17.29*  --  10.76*  --  12.36*    Estimated Creatinine Clearance: 7.5 ml/min (by C-G formula based on Cr of 12.36).    Assessment: 63 YOM who continues on Lovenox + Warfarin for new acute LUL PE. Today is D#5/5 of VTE overlap as recommended per CHEST guidelines - will discontinue lovenox after today's doses. INR remains therapeutic after holding yesterday's dose (2.83 << 2.73, goal of 2-3). Will continue at a reduced dose this evening. CBC stable - no overt s/sx of bleeding noted.  Goal of Therapy:  INR 2-3 Anti-Xa level 0.6-1 units/ml 4hrs after LMWH dose given Monitor platelets by anticoagulation protocol: Yes   Plan:  1. Discontinue Lovenox after today's doses 2. Warfarin 5 mg x 1 dose at 1800 (if still here) 3. Will continue to monitor for any signs/symptoms of bleeding and will follow up with PT/INR in the a.m.   Alycia Rossetti, PharmD, BCPS Clinical Pharmacist Pager: [phone] 05/12/2014 1:12 PM

## 2014-05-13 LAB — PROTIME-INR
INR: 2.63 — ABNORMAL HIGH (ref 0.00–1.49)
Prothrombin Time: 28.3 seconds — ABNORMAL HIGH (ref 11.6–15.2)

## 2014-05-13 MED ORDER — SENNOSIDES-DOCUSATE SODIUM 8.6-50 MG PO TABS
1.0000 | ORAL_TABLET | Freq: Two times a day (BID) | ORAL | Status: DC
  Administered 2014-05-13: 1 via ORAL
  Filled 2014-05-13: qty 1

## 2014-05-13 MED ORDER — BISACODYL 10 MG RE SUPP
10.0000 mg | Freq: Once | RECTAL | Status: AC
  Administered 2014-05-13: 10 mg via RECTAL
  Filled 2014-05-13: qty 1

## 2014-05-13 MED ORDER — WARFARIN SODIUM 5 MG PO TABS
5.0000 mg | ORAL_TABLET | Freq: Once | ORAL | Status: DC
  Filled 2014-05-13: qty 1

## 2014-05-13 MED ORDER — POLYETHYLENE GLYCOL 3350 17 G PO PACK
17.0000 g | PACK | Freq: Every day | ORAL | Status: DC
  Administered 2014-05-13: 17 g via ORAL
  Filled 2014-05-13: qty 1

## 2014-05-13 NOTE — Progress Notes (Signed)
ANTICOAGULATION CONSULT NOTE - Follow Up Consult  Pharmacy Consult for lovenox and coumadin Indication: LUL pulmonary embolism    Allergies  Allergen Reactions  . Ferrlecit [Na Ferric Gluc Cplx In Sucrose] Shortness Of Breath, Swelling and Other (See Comments)    Swelling in throat  . Darvocet [Propoxyphene N-Acetaminophen] Hives    Patient Measurements: Height: _0  (188 cm) Weight: 163 lb 2.3 oz (74 kg) IBW/kg (Calculated) : 82.2  Vital Signs: Temp: 98 F (36.7 C) (10/23 0418) Temp Source: Oral (10/23 0418) BP: 183/107 mmHg (10/23 0418) Pulse Rate: 90 (10/23 0418)  Labs:  Recent Labs  05/11/14 0444 05/11/14 0949 05/12/14 0500 05/12/14 0658 05/12/14 1342 05/13/14 0500  HGB  --   --   --  9.6*  --   --   HCT  --   --   --  29.9*  --   --   PLT  --   --   --  203  --   --   LABPROT 29.2*  --  30.0*  --   --  28.3*  INR 2.73*  --  2.83*  --   --  2.63*  CREATININE  --  10.76*  --  12.36* 5.83*  --     Estimated Creatinine Clearance: 15.9 ml/min (by C-G formula based on Cr of 5.83).    Assessment: 75 YOM who continues on Lovenox + Warfarin for new acute LUL PE. The patient completed 5 days VTE overlap with lovenox on 10/22 as recommended per CHEST guidelines. INR remains therapeutic after holding yesterday's dose (2.63 << 2.83, goal of 2-3). Will continue at a reduced dose this evening. No CBC today (stable on 10/22) - no overt s/sx of bleeding noted.  Noted plans for discharge - would recommend warfarin 5 mg daily with repeat INR check by Monday, 10/26.  Goal of Therapy:  INR 2-3 Monitor platelets by anticoagulation protocol: Yes   Plan:  1. Warfarin 5 mg x 1 dose at 1800 (if still here) 2. Will continue to monitor for any signs/symptoms of bleeding and will follow up with PT/INR in the a.m (if still here)  Alycia Rossetti, PharmD, BCPS Clinical Pharmacist Pager: [phone] 05/13/2014 8:19 AM

## 2014-05-13 NOTE — Progress Notes (Signed)
Patient discharge teaching given, including activity, diet, follow-up appoints, and medications. Patient verbalized understanding of all discharge instructions. IV access was d/c'd. Vitals are stable. Skin is intact except as charted in most recent assessments. Pt waiting on ride to arrive.  Jillyn Ledger, MBA, BS, RN

## 2014-05-13 NOTE — Progress Notes (Signed)
Patient discharge teaching given, including activity, diet, follow-up appoints, and medications. Patient verbalized understanding of all discharge instructions. IV access was d/c'd. Vitals are stable. Skin is intact except as charted in most recent assessments.     Penni Bombard, RN 05/13/2014 2:58 PM

## 2014-05-16 ENCOUNTER — Ambulatory Visit: Payer: Medicare Other | Attending: Family Medicine

## 2014-05-16 DIAGNOSIS — I2699 Other pulmonary embolism without acute cor pulmonale: Secondary | ICD-10-CM

## 2014-05-16 LAB — POCT INR: INR: 1.8

## 2014-05-16 MED ORDER — WARFARIN SODIUM 5 MG PO TABS
5.0000 mg | ORAL_TABLET | Freq: Once | ORAL | Status: DC
Start: 2014-05-16 — End: 2014-09-02

## 2014-05-16 NOTE — Patient Instructions (Signed)
Take 1 tablet Coumadin at 6pm every night and return next Monday for recheck Education materials given for Coumadin therapyVitamin K Foods and Warfarin Warfarin is a medicine that helps prevent harmful blood clots by causing blood to clot more slowly. It does this by decreasing the activity of vitamin K, which promotes normal blood clotting. For the dose of warfarin you have been prescribed to work well, you need to get about the same amount of vitamin K from your food from day to day. Suddenly getting a lot more vitamin K could cause your blood to clot too quickly. A sudden decrease in vitamin K intake could cause your blood to clot too slowly. These changes in vitamin K intake could lead to dangerous blood clotsor to bleeding. WHAT GENERAL GUIDELINES DO I NEED TO FOLLOW?  Keep your intake of vitamin K consistent from day to day. To do this, you must be aware of which foods contain moderate or high amounts of vitamin K. Listed below are some foods that are very high, high, or moderately high in vitamin K. If you eat these foods, make sure you eat a consistent amount of them from day to day.  Avoid major changes in your diet, or tell your health care provider before changing your diet.  If you take a multivitamin that contains vitamin K, be sure to take it every day.  If you drink green tea, drink the same amount each day. WHAT FOODS ARE VERY HIGH IN VITAMIN K?   Greens, such as Swiss chard and beet, collard, mustard, or turnip greens (fresh or frozen, cooked).  Kale (fresh or frozen, cooked).   Parsley (raw).  Spinach (cooked).  WHAT FOODS ARE HIGH IN VITAMIN K?  Asparagus (frozen, cooked).  Beans, green (frozen, cooked).  Broccoli.   Bok choy (cooked).   Brussels sprouts (fresh or frozen, cooked).  Cabbage (cooked).  Coleslaw. WHAT FOODS ARE MODERATELY HIGH IN VITAMIN K?  Blueberries.  Black-eyed peas.  Endive (raw).   Green leaf lettuce (raw).   Green  scallions (raw).  Kale (raw).  Okra (frozen, cooked).  Plantains (fried).  Romaine lettuce (raw).   Sauerkraut (canned).   Spinach (raw). Document Released: 05/05/2009 Document Revised: 07/13/2013 Document Reviewed: 05/12/2013 [facility] Patient Information 2015 [place], [place]. This information is not intended to replace advice given to you by your health care provider. Make sure you discuss any questions you have with your health care provider. Warfarin: What You Need to Know Warfarin is an anticoagulant. Anticoagulants help prevent the formation of blood clots. They also help stop the growth of blood clots. Warfarin is sometimes referred to as a "blood thinner."  Normally, when body tissues are cut or damaged, the blood clots in order to prevent blood loss. Sometimes clots form inside your blood vessels and obstruct the flow of blood through your circulatory system (thrombosis). These clots may travel through your bloodstream and become lodged in smaller blood vessels in your brain, which can cause a stroke, or in your lungs (pulmonary embolism). WHO SHOULD USE WARFARIN? Warfarin is prescribed for people at risk of developing harmful blood clots:  People with surgically implanted mechanical heart valves, irregular heart rhythms called atrial fibrillation, and certain clotting disorders.  People who have developed harmful blood clotting in the past, including those who have had a stroke or a pulmonary embolism, or thrombosis in their legs (deep vein thrombosis [DVT]).  People with an existing blood clot, such as a pulmonary embolism. WARFARIN DOSING Warfarin tablets come  in different strengths. Each tablet strength is a different color, with the amount of warfarin (in milligrams) clearly printed on the tablet. If the color of your tablet is different than usual when you receive a new prescription, report it immediately to your pharmacist or health care provider. WARFARIN MONITORING The  goal of warfarin therapy is to lessen the clotting tendency of blood but not prevent clotting completely. Your health care provider will monitor the anticoagulation effect of warfarin closely and adjust your dose as needed. For your safety, blood tests called prothrombin time (PT) or international normalized ratio (INR) are used to measure the effects of warfarin. Both of these tests can be done with a finger stick or a blood draw. The longer it takes the blood to clot, the higher the PT or INR. Your health care provider will inform you of your "target" PT or INR range. If, at any time, your PT or INR is above the target range, there is a risk of bleeding. If your PT or INR is below the target range, there is a risk of clotting. Whether you are started on warfarin while you are in the hospital or in your health care provider's office, you will need to have your PT or INR checked within one week of starting the medicine. Initially, some people are asked to have their PT or INR checked as much as twice a week. Once you are on a stable maintenance dose, the PT or INR is checked less often, usually once every 2 to 4 weeks. The warfarin dose may be adjusted if the PT or INR is not within the target range. It is important to keep all laboratory and health care provider follow-up appointments. Not keeping appointments could result in a chronic or permanent injury, pain, or disability because warfarin is a medicine that requires close monitoring. WHAT ARE THE SIDE EFFECTS OF WARFARIN?  Too much warfarin can cause bleeding (hemorrhage) from any part of the body. This may include bleeding from the gums, blood in the urine, bloody or dark stools, a nosebleed that is not easily stopped, coughing up blood, or vomiting blood.  Too little warfarin can increase the risk of blood clots.  Too little or too much warfarin can also increase the risk of a stroke.  Warfarin use may cause a skin rash or irritation, an unusual  fever, continual nausea or stomach upset, or severe pain in your joints or back. SPECIAL PRECAUTIONS WHILE TAKING WARFARIN Warfarin should be taken exactly as directed. It is very important to take warfarin as directed since bleeding or blood clots could result in chronic or permanent injury, pain, or disability.  Take your medicine at the same time every day. If you forget to take your dose, you can take it if it is within 6 hours of when it was due.  Do not change the dose of warfarin on your own to make up for missed or extra doses.  If you miss more than 2 doses in a row, you should contact your health care provider for advice. Avoid situations that cause bleeding. You may have a tendency to bleed more easily than usual while taking warfarin. The following actions can limit bleeding:  Using a softer toothbrush.  Flossing with waxed floss rather than unwaxed floss.  Shaving with an Copy rather than a blade.  Limiting the use of sharp objects.  Avoiding potentially harmful activities, such as contact sports. Warfarin and Pregnancy or Breastfeeding  Warfarin is not  advised during the first trimester of pregnancy due to an increased risk of birth defects. In certain situations, a woman may take warfarin after her first trimester of pregnancy. A woman who becomes pregnant or plans to become pregnant while taking warfarin should notify her health care provider immediately.  Although warfarin does not pass into breast milk, a woman who wishes to breastfeed while taking warfarin should also consult with her health care provider. Alcohol, Smoking, and Illicit Drug Use  Alcohol affects how warfarin works in the body. It is best to avoid alcoholic drinks or consume very small amounts while taking warfarin. In general, alcohol intake should be limited to 1 oz (30 mL) of liquor, 6 oz (180 mL) of wine, or 12 oz (360 mL) of beer each day. Notify your health care provider if you change your  alcohol intake.  Smoking affects how warfarin works. It is best to avoid smoking while taking warfarin. Notify your health care provider if you change your smoking habits.  It is best to avoid all illicit drugs while taking warfarin since there are few studies that show how warfarin interacts with these drugs. Other Medicines and Dietary Supplements Many prescription and over-the-counter medicines can interfere with warfarin. Be sure all of your health care providers know you are taking warfarin. Notify your health care provider who prescribed warfarin for you or your pharmacist before starting or stopping any new medicines, including over-the-counter vitamins, dietary supplements, and pain medicines. Your warfarin dose may need to be adjusted. Some common over-the-counter medicines that may increase the risk of bleeding while taking warfarin include:   Acetaminophen.  Aspirin.  Nonsteroidal anti-inflammatory medicines (NSAIDs), such as ibuprofen or naproxen.  Vitamin E. Dietary Considerations  Foods that have moderate or high amounts of vitamin K can interfere with warfarin. Avoid major changes in your diet or notify your health care provider before changing your diet. Eat a consistent amount of foods that have moderate or high amounts of vitamin K. Eating less foods containing vitamin K can increase the risk of bleeding. Eating more foods containing vitamin K can increase the risk of blood clots. Additional questions about dietary considerations can be discussed with a dietitian. Foods that are very high in vitamin K:  Greens, such as Swiss chard and beet, collard, mustard, or turnip greens (fresh or frozen, cooked).  Kale (fresh or frozen, cooked).  Parsley (raw).  Spinach (cooked). Foods that are high in vitamin K:  Asparagus (frozen, cooked).  Beans, green (frozen, cooked).  Broccoli.  Bok choy (cooked).  Brussels sprouts (fresh or frozen, cooked).  Cabbage (cooked).    Coleslaw. Foods that are moderately high in vitamin K:  Blueberries.  Black-eyed peas.  Endive (raw).  Green leaf lettuce (raw).  Green scallions (raw).  Kale (raw).  Okra (frozen, cooked).  Plantains (fried).  Romaine lettuce (raw).  Sauerkraut (canned).  Spinach (raw). CALL YOUR CLINIC OR HEALTH CARE PROVIDER IF YOU:  Plan to have any surgery or procedure.  Feel sick, especially if you have diarrhea or vomiting.  Experience or anticipate any major changes in your diet.  Start or stop a prescription or over-the-counter medicine.  Become, plan to become, or think you may be pregnant.  Are having heavier than usual menstrual periods.  Have had a fall, accident, or any symptoms of bleeding or unusual bruising.  Develop an unusual fever. CALL 911 IN THE [place] OR GO TO THE EMERGENCY DEPARTMENT IF YOU:   Think you may be having an  allergic reaction to warfarin. The signs of an allergic reaction could include itching, rash, hives, swelling, chest tightness, or trouble breathing.  See signs of blood in your urine. The signs could include reddish, pinkish, or tea-colored urine.  See signs of blood in your stools. The signs could include bright red or black stools.  Vomit or cough up blood. In these instances, the blood could have either a bright red or a "coffee-grounds" appearance.  Have bleeding that will not stop after applying pressure for 30 minutes such as cuts, nosebleeds, or other injuries.  Have severe pain in your joints or back.  Have a new and severe headache.  Have sudden weakness or numbness of your face, arm, or leg, especially on one side of your body.  Have sudden confusion or trouble understanding.  Have sudden trouble seeing in one or both eyes.  Have sudden trouble walking, dizziness, loss of balance, or coordination.  Have trouble speaking or understanding (aphasia). Document Released: 07/08/2005 Document Revised: 11/22/2013 Document  Reviewed: 01/01/2013 [facility] Patient Information 2015 [place], [place]. This information is not intended to replace advice given to you by your health care provider. Make sure you discuss any questions you have with your health care provider.

## 2014-05-18 ENCOUNTER — Encounter: Payer: Self-pay | Admitting: Internal Medicine

## 2014-05-18 ENCOUNTER — Ambulatory Visit: Payer: Medicare Other | Attending: Internal Medicine | Admitting: Internal Medicine

## 2014-05-18 VITALS — BP 154/101 | HR 86 | Temp 97.9°F | Resp 16 | Ht 74.0 in | Wt 169.0 lb

## 2014-05-18 DIAGNOSIS — Z79899 Other long term (current) drug therapy: Secondary | ICD-10-CM | POA: Diagnosis not present

## 2014-05-18 DIAGNOSIS — Z7901 Long term (current) use of anticoagulants: Secondary | ICD-10-CM | POA: Insufficient documentation

## 2014-05-18 DIAGNOSIS — Z7952 Long term (current) use of systemic steroids: Secondary | ICD-10-CM | POA: Insufficient documentation

## 2014-05-18 DIAGNOSIS — I2699 Other pulmonary embolism without acute cor pulmonale: Secondary | ICD-10-CM | POA: Diagnosis present

## 2014-05-18 DIAGNOSIS — I120 Hypertensive chronic kidney disease with stage 5 chronic kidney disease or end stage renal disease: Secondary | ICD-10-CM | POA: Diagnosis not present

## 2014-05-18 DIAGNOSIS — Z87891 Personal history of nicotine dependence: Secondary | ICD-10-CM | POA: Insufficient documentation

## 2014-05-18 DIAGNOSIS — N186 End stage renal disease: Secondary | ICD-10-CM | POA: Insufficient documentation

## 2014-05-18 DIAGNOSIS — Z992 Dependence on renal dialysis: Secondary | ICD-10-CM | POA: Diagnosis not present

## 2014-05-18 DIAGNOSIS — I10 Essential (primary) hypertension: Secondary | ICD-10-CM

## 2014-05-18 DIAGNOSIS — Z9114 Patient's other noncompliance with medication regimen: Secondary | ICD-10-CM | POA: Insufficient documentation

## 2014-05-18 DIAGNOSIS — T8611 Kidney transplant rejection: Secondary | ICD-10-CM | POA: Insufficient documentation

## 2014-05-18 LAB — POCT INR: INR: 1.7

## 2014-05-18 NOTE — Patient Instructions (Signed)
Warfarin: What You Need to Know Warfarin is an anticoagulant. Anticoagulants help prevent the formation of blood clots. They also help stop the growth of blood clots. Warfarin is sometimes referred to as a "blood thinner."  Normally, when body tissues are cut or damaged, the blood clots in order to prevent blood loss. Sometimes clots form inside your blood vessels and obstruct the flow of blood through your circulatory system (thrombosis). These clots may travel through your bloodstream and become lodged in smaller blood vessels in your brain, which can cause a stroke, or in your lungs (pulmonary embolism). WHO SHOULD USE WARFARIN? Warfarin is prescribed for people at risk of developing harmful blood clots:  People with surgically implanted mechanical heart valves, irregular heart rhythms called atrial fibrillation, and certain clotting disorders.  People who have developed harmful blood clotting in the past, including those who have had a stroke or a pulmonary embolism, or thrombosis in their legs (deep vein thrombosis [DVT]).  People with an existing blood clot, such as a pulmonary embolism. WARFARIN DOSING Warfarin tablets come in different strengths. Each tablet strength is a different color, with the amount of warfarin (in milligrams) clearly printed on the tablet. If the color of your tablet is different than usual when you receive a new prescription, report it immediately to your pharmacist or health care provider. WARFARIN MONITORING The goal of warfarin therapy is to lessen the clotting tendency of blood but not prevent clotting completely. Your health care provider will monitor the anticoagulation effect of warfarin closely and adjust your dose as needed. For your safety, blood tests called prothrombin time (PT) or international normalized ratio (INR) are used to measure the effects of warfarin. Both of these tests can be done with a finger stick or a blood draw. The longer it takes the  blood to clot, the higher the PT or INR. Your health care provider will inform you of your "target" PT or INR range. If, at any time, your PT or INR is above the target range, there is a risk of bleeding. If your PT or INR is below the target range, there is a risk of clotting. Whether you are started on warfarin while you are in the hospital or in your health care provider's office, you will need to have your PT or INR checked within one week of starting the medicine. Initially, some people are asked to have their PT or INR checked as much as twice a week. Once you are on a stable maintenance dose, the PT or INR is checked less often, usually once every 2 to 4 weeks. The warfarin dose may be adjusted if the PT or INR is not within the target range. It is important to keep all laboratory and health care provider follow-up appointments. Not keeping appointments could result in a chronic or permanent injury, pain, or disability because warfarin is a medicine that requires close monitoring. WHAT ARE THE SIDE EFFECTS OF WARFARIN?  Too much warfarin can cause bleeding (hemorrhage) from any part of the body. This may include bleeding from the gums, blood in the urine, bloody or dark stools, a nosebleed that is not easily stopped, coughing up blood, or vomiting blood.  Too little warfarin can increase the risk of blood clots.  Too little or too much warfarin can also increase the risk of a stroke.  Warfarin use may cause a skin rash or irritation, an unusual fever, continual nausea or stomach upset, or severe pain in your joints or back.  SPECIAL PRECAUTIONS WHILE TAKING WARFARIN Warfarin should be taken exactly as directed. It is very important to take warfarin as directed since bleeding or blood clots could result in chronic or permanent injury, pain, or disability.  Take your medicine at the same time every day. If you forget to take your dose, you can take it if it is within 6 hours of when it was  due.  Do not change the dose of warfarin on your own to make up for missed or extra doses.  If you miss more than 2 doses in a row, you should contact your health care provider for advice. Avoid situations that cause bleeding. You may have a tendency to bleed more easily than usual while taking warfarin. The following actions can limit bleeding:  Using a softer toothbrush.  Flossing with waxed floss rather than unwaxed floss.  Shaving with an Copy rather than a blade.  Limiting the use of sharp objects.  Avoiding potentially harmful activities, such as contact sports. Warfarin and Pregnancy or Breastfeeding  Warfarin is not advised during the first trimester of pregnancy due to an increased risk of birth defects. In certain situations, a woman may take warfarin after her first trimester of pregnancy. A woman who becomes pregnant or plans to become pregnant while taking warfarin should notify her health care provider immediately.  Although warfarin does not pass into breast milk, a woman who wishes to breastfeed while taking warfarin should also consult with her health care provider. Alcohol, Smoking, and Illicit Drug Use  Alcohol affects how warfarin works in the body. It is best to avoid alcoholic drinks or consume very small amounts while taking warfarin. In general, alcohol intake should be limited to 1 oz (30 mL) of liquor, 6 oz (180 mL) of wine, or 12 oz (360 mL) of beer each day. Notify your health care provider if you change your alcohol intake.  Smoking affects how warfarin works. It is best to avoid smoking while taking warfarin. Notify your health care provider if you change your smoking habits.  It is best to avoid all illicit drugs while taking warfarin since there are few studies that show how warfarin interacts with these drugs. Other Medicines and Dietary Supplements Many prescription and over-the-counter medicines can interfere with warfarin. Be sure all of your  health care providers know you are taking warfarin. Notify your health care provider who prescribed warfarin for you or your pharmacist before starting or stopping any new medicines, including over-the-counter vitamins, dietary supplements, and pain medicines. Your warfarin dose may need to be adjusted. Some common over-the-counter medicines that may increase the risk of bleeding while taking warfarin include:   Acetaminophen.  Aspirin.  Nonsteroidal anti-inflammatory medicines (NSAIDs), such as ibuprofen or naproxen.  Vitamin E. Dietary Considerations  Foods that have moderate or high amounts of vitamin K can interfere with warfarin. Avoid major changes in your diet or notify your health care provider before changing your diet. Eat a consistent amount of foods that have moderate or high amounts of vitamin K. Eating less foods containing vitamin K can increase the risk of bleeding. Eating more foods containing vitamin K can increase the risk of blood clots. Additional questions about dietary considerations can be discussed with a dietitian. Foods that are very high in vitamin K:  Greens, such as Swiss chard and beet, collard, mustard, or turnip greens (fresh or frozen, cooked).  Kale (fresh or frozen, cooked).  Parsley (raw).  Spinach (cooked). Foods that are high  in vitamin K:  Asparagus (frozen, cooked).  Beans, green (frozen, cooked).  Broccoli.  Bok choy (cooked).  Brussels sprouts (fresh or frozen, cooked).  Cabbage (cooked).   Coleslaw. Foods that are moderately high in vitamin K:  Blueberries.  Black-eyed peas.  Endive (raw).  Green leaf lettuce (raw).  Green scallions (raw).  Kale (raw).  Okra (frozen, cooked).  Plantains (fried).  Romaine lettuce (raw).  Sauerkraut (canned).  Spinach (raw). CALL YOUR CLINIC OR HEALTH CARE PROVIDER IF YOU:  Plan to have any surgery or procedure.  Feel sick, especially if you have diarrhea or  vomiting.  Experience or anticipate any major changes in your diet.  Start or stop a prescription or over-the-counter medicine.  Become, plan to become, or think you may be pregnant.  Are having heavier than usual menstrual periods.  Have had a fall, accident, or any symptoms of bleeding or unusual bruising.  Develop an unusual fever. CALL 911 IN THE [place] OR GO TO THE EMERGENCY DEPARTMENT IF YOU:   Think you may be having an allergic reaction to warfarin. The signs of an allergic reaction could include itching, rash, hives, swelling, chest tightness, or trouble breathing.  See signs of blood in your urine. The signs could include reddish, pinkish, or tea-colored urine.  See signs of blood in your stools. The signs could include bright red or black stools.  Vomit or cough up blood. In these instances, the blood could have either a bright red or a "coffee-grounds" appearance.  Have bleeding that will not stop after applying pressure for 30 minutes such as cuts, nosebleeds, or other injuries.  Have severe pain in your joints or back.  Have a new and severe headache.  Have sudden weakness or numbness of your face, arm, or leg, especially on one side of your body.  Have sudden confusion or trouble understanding.  Have sudden trouble seeing in one or both eyes.  Have sudden trouble walking, dizziness, loss of balance, or coordination.  Have trouble speaking or understanding (aphasia). Document Released: 07/08/2005 Document Revised: 11/22/2013 Document Reviewed: 01/01/2013 [facility] Patient Information 2015 [place], [place]. This information is not intended to replace advice given to you by your health care provider. Make sure you discuss any questions you have with your health care provider.

## 2014-05-18 NOTE — Progress Notes (Signed)
HFU Pt recently diagnosed with pulmonary blood clots. Pt is here to check his INR and to continue his care.

## 2014-05-18 NOTE — Progress Notes (Signed)
Patient ID: Frank [name], male   DOB: 26-Feb-1966, 48 y.o.   MRN: [id number]  CC: Hospital follow-up pulmonary emboli  HPI: Frank [name] is a 48 y.o. male with a history of ESRD on HD for 8 years since his renal transplant rejection, also history of Malignant HTN, and DM.  He was evaluated in the ED last week and found to have Pulmonary Embolism of the LUL on CTA of the Chest. He is currently on Warfarin 5 mg daily with a INR of 1.7 today.  He currently receives dialysis on Tuesday, Thursday, and Saturday as a result of longstanding malignant hypertension.  He is on a complex anti-hypertensive regimen with a history of non-compliance.  He is currently being managed by a Nephrologist in [facility] which is in the process of placing a stent so he may do home dialysis on this Sunday.  He will have to discontinue warfarin before stent placement.  He was told that it will be a 10 day hospital course.  He reports that all the medication he takes is affecting his stomach.    Compared [facility] and [facility] medication list and there are multiple medication discrepancies.  Recent [facility] discharge papers state the patient was to increase hydralazine to 100 mg TID but records show he was prescribed 50 mg TID, patient reports that [facility] has him on 75 mg TID of hydralazine.  Patient will bring all medication this week for review to nurse before changes are made to regimen.      Allergies  Allergen Reactions  . Ferrlecit [Na Ferric Gluc Cplx In Sucrose] Shortness Of Breath, Swelling and Other (See Comments)    Swelling in throat  . Darvocet [Propoxyphene N-Acetaminophen] Hives   Past Medical History  Diagnosis Date  . Hypertension   . Depression   . Complication of anesthesia     itching, sore throat  . Diabetes mellitus without complication     No history per patient, but remains under history as A1c would not be accurate given on dialysis  . Shortness of breath   . Anxiety   . ESRD (end stage renal  disease)     due to HTN per patient, followed at [facility], s/p failed kidney transplant - dialysis Tue, Th, Sat   Current Outpatient Prescriptions on File Prior to Visit  Medication Sig Dispense Refill  . allopurinol (ZYLOPRIM) 100 MG tablet Take 100 mg by mouth daily.      [name] amLODipine (NORVASC) 5 MG tablet Take 10 mg by mouth daily.      . cinacalcet (SENSIPAR) 30 MG tablet Take 30 mg by mouth daily.      . cloNIDine (CATAPRES) 0.3 MG tablet Take 1 tablet (0.3 mg total) by mouth 3 (three) times daily.  90 tablet  0  . cyclobenzaprine (FLEXERIL) 10 MG tablet Take 1 tablet (10 mg total) by mouth 3 (three) times daily as needed.  30 tablet  0  . diazepam (VALIUM) 5 MG tablet Take 1 tablet (5 mg total) by mouth 2 (two) times daily.  4 tablet  0  . furosemide (LASIX) 80 MG tablet Take 80 mg by mouth daily.      . hydrALAZINE (APRESOLINE) 25 MG tablet Take 50 mg by mouth 3 (three) times daily.      . isosorbide dinitrate (ISORDIL) 40 MG tablet Take 40 mg by mouth every 8 (eight) hours.      [name] lisinopril (PRINIVIL,ZESTRIL) 20 MG tablet Take 20 mg by mouth  daily.      . minoxidil (LONITEN) 2.5 MG tablet Take 1 tablet (2.5 mg total) by mouth daily.  30 tablet  0  . NIFEdipine (PROCARDIA-XL/ADALAT CC) 30 MG 24 hr tablet Take 1 tablet (30 mg total) by mouth daily.  30 tablet  0  . omeprazole (PRILOSEC) 20 MG capsule Take 20 mg by mouth daily.      . polyethylene glycol (MIRALAX) packet Take 17 g by mouth daily. For constipation  14 each  0  . predniSONE (DELTASONE) 2.5 MG tablet Take 2.5 mg by mouth 2 (two) times daily with a meal.      . sevelamer carbonate (RENVELA) 800 MG tablet Take 1,600 mg by mouth 3 (three) times daily with meals.      . tacrolimus (PROGRAF) 1 MG capsule Take 5 mg by mouth 2 (two) times daily.      . traMADol (ULTRAM) 50 MG tablet Take 2 tablets (100 mg total) by mouth every 6 (six) hours as needed.  16 tablet  0  . warfarin (COUMADIN) 5 MG tablet Take 1 tablet (5 mg total) by  mouth one time only at 6 PM. Take 25m daily at 6pm  30 tablet  2  . acetaminophen (TYLENOL) 325 MG tablet Take 2 tablets (650 mg total) by mouth every 4 (four) hours as needed for mild pain, fever or headache.       No current facility-administered medications on file prior to visit.   History reviewed. No pertinent family history. History   Social History  . Marital Status: Married    Spouse Name: N/A    Number of Children: 3  . Years of Education: [employer]   Occupational History  . Not on file.   Social History Main Topics  . Smoking status: Former Smoker -- 1.00 packs/day for 1 years    Types: Cigarettes  . Smokeless tobacco: Never Used     Comment: quit Jan 2014  . Alcohol Use: No  . Drug Use: No  . Sexual Activity: Not Currently   Other Topics Concern  . Not on file   Social History Narrative   Owns own plumbing company    Review of Systems: Constitutional: Negative for fever, chills, diaphoresis, activity change, appetite change and fatigue. HENT: Negative for ear pain, nosebleeds, congestion, facial swelling, rhinorrhea, neck pain, neck stiffness and ear discharge.  Eyes: Negative for pain, discharge, redness, itching and visual disturbance. Respiratory: Negative for cough, choking, chest tightness, shortness of breath, wheezing and stridor.  Cardiovascular: Negative for chest pain, palpitations and leg swelling. Gastrointestinal: Negative for abdominal distention. Genitourinary: Negative for dysuria, urgency, frequency, hematuria, flank pain, decreased urine volume, difficulty urinating and dyspareunia.  Musculoskeletal: Negative for back pain, joint swelling, arthralgias and gait problem. Neurological: Negative for dizziness, tremors, seizures, syncope, facial asymmetry, speech difficulty, weakness, light-headedness, numbness and headaches.  Hematological: Negative for adenopathy. Does not bruise/bleed easily. Psychiatric/Behavioral: Negative for hallucinations,  behavioral problems, confusion, dysphoric mood, decreased concentration and agitation.    Objective:   Filed Vitals:   05/18/14 0956  BP: 154/101  Pulse: 86  Temp: 97.9 F (36.6 C)  Resp: 16   Physical Exam  Constitutional: He is oriented to person, place, and time.  HENT:  Right Ear: External ear normal.  Left Ear: External ear normal.  Mouth/Throat: Oropharynx is clear and moist.  Eyes: Conjunctivae are normal. Pupils are equal, round, and reactive to light.  Neck: Normal range of motion. Neck supple. No thyromegaly present.  Cardiovascular: Normal rate, regular rhythm and normal heart sounds.   Pulmonary/Chest: Effort normal and breath sounds normal.  Abdominal: Soft. Bowel sounds are normal.  Musculoskeletal: Normal range of motion. He exhibits no edema and no tenderness.  Lymphadenopathy:    He has no cervical adenopathy.  Neurological: He is alert and oriented to person, place, and time.     Lab Results  Component Value Date   WBC 5.9 05/12/2014   HGB 9.6* 05/12/2014   HCT 29.9* 05/12/2014   MCV 74.8* 05/12/2014   PLT 203 05/12/2014   Lab Results  Component Value Date   CREATININE 5.83* 05/12/2014   BUN 15 05/12/2014   NA 136* 05/12/2014   K 4.6 05/12/2014   CL 97 05/12/2014   CO2 27 05/12/2014    Lab Results  Component Value Date   HGBA1C 5.9* 10/17/2013   Lipid Panel  No results found for this basename: chol, trig, hdl, cholhdl, vldl, ldlcalc       Assessment and plan:   Darly was seen today for follow-up.  Diagnoses and associated orders for this visit:  Acute pulmonary embolism - INR    Patients coumadin dose will remain the same this week since he will have surgery this week and coumadin will be discontinued anyway.  Explained that to patient and he is in agreement. Consulted on this case and coumadin with Dr. Doreene Burke Patient will have INR check within 1 week of hospital discharge or sooner if surgery is canceled  Malignant essential  hypertension Will be made once medication reconciliation is complete. May possibly discontinue oral clonidine and replace with clonidine patch if necessary  ESRD on dialysis- Tues, Thurs, Sat in [place], nephro: [name] Continue care with nephrology at [facility]   Return in about 1 day (around 05/19/2014) for Nurse Visit-medication up date. patient will resume care with Dr. Doreene Burke on all subsequent follow-up visits    [name], [facility] [phone] 05/18/2014, 10:20 AM

## 2014-05-19 ENCOUNTER — Ambulatory Visit: Payer: Medicare Other

## 2014-08-30 ENCOUNTER — Encounter (HOSPITAL_COMMUNITY): Payer: Self-pay | Admitting: Emergency Medicine

## 2014-08-30 ENCOUNTER — Emergency Department (HOSPITAL_COMMUNITY): Payer: Medicare Other

## 2014-08-30 ENCOUNTER — Inpatient Hospital Stay (HOSPITAL_COMMUNITY)
Admission: EM | Admit: 2014-08-30 | Discharge: 2014-09-02 | DRG: 286 | Disposition: A | Discharge status: Home / Self Care | Payer: Medicare Other | Attending: Internal Medicine | Admitting: Internal Medicine

## 2014-08-30 DIAGNOSIS — R791 Abnormal coagulation profile: Secondary | ICD-10-CM | POA: Diagnosis present

## 2014-08-30 DIAGNOSIS — Z86711 Personal history of pulmonary embolism: Secondary | ICD-10-CM | POA: Diagnosis present

## 2014-08-30 DIAGNOSIS — E119 Type 2 diabetes mellitus without complications: Secondary | ICD-10-CM | POA: Diagnosis present

## 2014-08-30 DIAGNOSIS — G8929 Other chronic pain: Secondary | ICD-10-CM | POA: Diagnosis present

## 2014-08-30 DIAGNOSIS — R079 Chest pain, unspecified: Secondary | ICD-10-CM | POA: Diagnosis present

## 2014-08-30 DIAGNOSIS — N186 End stage renal disease: Secondary | ICD-10-CM | POA: Diagnosis present

## 2014-08-30 DIAGNOSIS — I2510 Atherosclerotic heart disease of native coronary artery without angina pectoris: Secondary | ICD-10-CM | POA: Diagnosis present

## 2014-08-30 DIAGNOSIS — Z833 Family history of diabetes mellitus: Secondary | ICD-10-CM

## 2014-08-30 DIAGNOSIS — Z79899 Other long term (current) drug therapy: Secondary | ICD-10-CM

## 2014-08-30 DIAGNOSIS — Z7952 Long term (current) use of systemic steroids: Secondary | ICD-10-CM | POA: Diagnosis not present

## 2014-08-30 DIAGNOSIS — R9431 Abnormal electrocardiogram [ECG] [EKG]: Secondary | ICD-10-CM | POA: Diagnosis present

## 2014-08-30 DIAGNOSIS — Z886 Allergy status to analgesic agent status: Secondary | ICD-10-CM

## 2014-08-30 DIAGNOSIS — I248 Other forms of acute ischemic heart disease: Secondary | ICD-10-CM

## 2014-08-30 DIAGNOSIS — I120 Hypertensive chronic kidney disease with stage 5 chronic kidney disease or end stage renal disease: Secondary | ICD-10-CM | POA: Diagnosis present

## 2014-08-30 DIAGNOSIS — D509 Iron deficiency anemia, unspecified: Secondary | ICD-10-CM

## 2014-08-30 DIAGNOSIS — R9439 Abnormal result of other cardiovascular function study: Secondary | ICD-10-CM | POA: Clinically undetermined

## 2014-08-30 DIAGNOSIS — Z888 Allergy status to other drugs, medicaments and biological substances status: Secondary | ICD-10-CM

## 2014-08-30 DIAGNOSIS — I10 Essential (primary) hypertension: Secondary | ICD-10-CM

## 2014-08-30 DIAGNOSIS — Z992 Dependence on renal dialysis: Secondary | ICD-10-CM

## 2014-08-30 DIAGNOSIS — R778 Other specified abnormalities of plasma proteins: Secondary | ICD-10-CM

## 2014-08-30 DIAGNOSIS — M109 Gout, unspecified: Secondary | ICD-10-CM | POA: Diagnosis present

## 2014-08-30 DIAGNOSIS — R0781 Pleurodynia: Secondary | ICD-10-CM | POA: Diagnosis present

## 2014-08-30 DIAGNOSIS — T8612 Kidney transplant failure: Secondary | ICD-10-CM | POA: Diagnosis present

## 2014-08-30 DIAGNOSIS — I5040 Unspecified combined systolic (congestive) and diastolic (congestive) heart failure: Secondary | ICD-10-CM | POA: Diagnosis present

## 2014-08-30 DIAGNOSIS — N189 Chronic kidney disease, unspecified: Secondary | ICD-10-CM | POA: Diagnosis present

## 2014-08-30 DIAGNOSIS — Z87891 Personal history of nicotine dependence: Secondary | ICD-10-CM

## 2014-08-30 DIAGNOSIS — Z9119 Patient's noncompliance with other medical treatment and regimen: Secondary | ICD-10-CM | POA: Diagnosis present

## 2014-08-30 DIAGNOSIS — D631 Anemia in chronic kidney disease: Secondary | ICD-10-CM | POA: Diagnosis present

## 2014-08-30 DIAGNOSIS — Z7901 Long term (current) use of anticoagulants: Secondary | ICD-10-CM

## 2014-08-30 DIAGNOSIS — R7989 Other specified abnormal findings of blood chemistry: Secondary | ICD-10-CM

## 2014-08-30 LAB — TROPONIN I
Troponin I: 0.13 ng/mL — ABNORMAL HIGH (ref <0.031)
Troponin I: 0.18 ng/mL — ABNORMAL HIGH (ref <0.031)
Troponin I: 0.11 ng/mL — ABNORMAL HIGH (ref <0.031)
Troponin I: 0.09 ng/mL — ABNORMAL HIGH (ref <0.031)
Troponin I: 0.07 ng/mL — ABNORMAL HIGH (ref <0.031)

## 2014-08-30 LAB — CBC
HCT: 24.3 % — ABNORMAL LOW (ref 39.0–52.0)
HCT: 22.8 % — ABNORMAL LOW (ref 39.0–52.0)
HCT: 29 % — ABNORMAL LOW (ref 39.0–52.0)
HEMOGLOBIN: 9.7 g/dL — AB (ref 13.0–17.0)
Hemoglobin: 7.4 g/dL — ABNORMAL LOW (ref 13.0–17.0)
Hemoglobin: 7 g/dL — ABNORMAL LOW (ref 13.0–17.0)
MCH: 22.8 pg — AB (ref 26.0–34.0)
MCH: 23 pg — ABNORMAL LOW (ref 26.0–34.0)
MCH: 24.5 pg — ABNORMAL LOW (ref 26.0–34.0)
MCHC: 30.5 g/dL (ref 30.0–36.0)
MCHC: 30.7 g/dL (ref 30.0–36.0)
MCHC: 33.4 g/dL (ref 30.0–36.0)
MCV: 74.8 fL — ABNORMAL LOW (ref 78.0–100.0)
MCV: 74.8 fL — ABNORMAL LOW (ref 78.0–100.0)
MCV: 73.2 fL — ABNORMAL LOW (ref 78.0–100.0)
PLATELETS: 198 10*3/uL (ref 150–400)
Platelets: 228 10*3/uL (ref 150–400)
Platelets: 213 10*3/uL (ref 150–400)
RBC: 3.25 MIL/uL — ABNORMAL LOW (ref 4.22–5.81)
RBC: 3.05 MIL/uL — AB (ref 4.22–5.81)
RBC: 3.96 MIL/uL — AB (ref 4.22–5.81)
RDW: 20.9 % — AB (ref 11.5–15.5)
RDW: 21 % — ABNORMAL HIGH (ref 11.5–15.5)
RDW: 19.2 % — ABNORMAL HIGH (ref 11.5–15.5)
WBC: 8.2 10*3/uL (ref 4.0–10.5)
WBC: 7.2 10*3/uL (ref 4.0–10.5)
WBC: 7.8 10*3/uL (ref 4.0–10.5)

## 2014-08-30 LAB — I-STAT TROPONIN, ED: TROPONIN I, POC: 0.11 ng/mL — AB (ref 0.00–0.08)

## 2014-08-30 LAB — BASIC METABOLIC PANEL
ANION GAP: 9 (ref 5–15)
BUN: 67 mg/dL — ABNORMAL HIGH (ref 6–23)
CALCIUM: 7.6 mg/dL — AB (ref 8.4–10.5)
CO2: 25 mmol/L (ref 19–32)
Chloride: 101 mmol/L (ref 96–112)
Creatinine, Ser: 13.74 mg/dL — ABNORMAL HIGH (ref 0.50–1.35)
GFR calc Af Amer: 4 mL/min — ABNORMAL LOW (ref >90)
GFR, EST NON AFRICAN AMERICAN: 4 mL/min — AB (ref >90)
GLUCOSE: 85 mg/dL (ref 70–99)
Potassium: 4.4 mmol/L (ref 3.5–5.1)
SODIUM: 135 mmol/L (ref 135–145)

## 2014-08-30 LAB — ABO/RH: ABO/RH(D): B POS

## 2014-08-30 LAB — PREPARE RBC (CROSSMATCH)

## 2014-08-30 LAB — HEPARIN LEVEL (UNFRACTIONATED): Heparin Unfractionated: <0.1 IU/mL — ABNORMAL LOW (ref 0.30–0.70)

## 2014-08-30 LAB — PROTIME-INR
INR: 1.01 (ref 0.00–1.49)
PROTHROMBIN TIME: 13.4 s (ref 11.6–15.2)

## 2014-08-30 LAB — BRAIN NATRIURETIC PEPTIDE: B NATRIURETIC PEPTIDE 5: 1924.6 pg/mL — AB (ref 0.0–100.0)

## 2014-08-30 MED ORDER — DIAZEPAM 5 MG PO TABS: 5.0000 mg | ORAL_TABLET | Freq: Two times a day (BID) | ORAL | Status: DC | PRN

## 2014-08-30 MED ORDER — SODIUM CHLORIDE 0.9 % IV SOLN: Freq: Once | INTRAVENOUS | Status: DC

## 2014-08-30 MED ORDER — MORPHINE SULFATE 2 MG/ML IJ SOLN: 1.0000 mg | INTRAMUSCULAR | Status: DC | PRN

## 2014-08-30 MED ORDER — CYCLOBENZAPRINE HCL 10 MG PO TABS: 10.0000 mg | ORAL_TABLET | Freq: Three times a day (TID) | ORAL | Status: DC | PRN

## 2014-08-30 MED ORDER — ISOSORBIDE DINITRATE 10 MG PO TABS
40.0000 mg | ORAL_TABLET | Freq: Three times a day (TID) | ORAL | Status: DC
  Administered 2014-08-30 – 2014-09-02 (×8): 40 mg via ORAL
  Filled 2014-08-30 (×3): qty 2
  Filled 2014-08-30: qty 4
  Filled 2014-08-30 (×5): qty 2

## 2014-08-30 MED ORDER — ATORVASTATIN CALCIUM 40 MG PO TABS
40.0000 mg | ORAL_TABLET | Freq: Every day | ORAL | Status: DC
  Administered 2014-08-30 – 2014-09-02 (×4): 40 mg via ORAL
  Filled 2014-08-30 (×5): qty 1

## 2014-08-30 MED ORDER — NIFEDIPINE ER 30 MG PO TB24
30.0000 mg | ORAL_TABLET | Freq: Every day | ORAL | Status: DC
  Administered 2014-08-31 – 2014-09-02 (×3): 30 mg via ORAL
  Filled 2014-08-30 (×3): qty 1

## 2014-08-30 MED ORDER — ASPIRIN 81 MG PO CHEW
324.0000 mg | CHEWABLE_TABLET | Freq: Once | ORAL | Status: AC
  Administered 2014-08-30: 324 mg via ORAL
  Filled 2014-08-30: qty 4

## 2014-08-30 MED ORDER — PANTOPRAZOLE SODIUM 40 MG PO TBEC
40.0000 mg | DELAYED_RELEASE_TABLET | Freq: Every day | ORAL | Status: DC
  Administered 2014-08-30 – 2014-09-02 (×3): 40 mg via ORAL
  Filled 2014-08-30 (×3): qty 1

## 2014-08-30 MED ORDER — ONDANSETRON HCL 4 MG/2ML IJ SOLN: 4.0000 mg | Freq: Four times a day (QID) | INTRAMUSCULAR | Status: DC | PRN

## 2014-08-30 MED ORDER — MINOXIDIL 2.5 MG PO TABS
2.5000 mg | ORAL_TABLET | Freq: Every day | ORAL | Status: DC
  Administered 2014-08-31 – 2014-09-02 (×3): 2.5 mg via ORAL
  Filled 2014-08-30 (×3): qty 1

## 2014-08-30 MED ORDER — WARFARIN SODIUM 5 MG PO TABS: 5.0000 mg | ORAL_TABLET | Freq: Once | ORAL | Status: DC

## 2014-08-30 MED ORDER — TRAMADOL HCL 50 MG PO TABS: 100.0000 mg | ORAL_TABLET | Freq: Four times a day (QID) | ORAL | Status: DC | PRN

## 2014-08-30 MED ORDER — HEPARIN BOLUS VIA INFUSION
2000.0000 [IU] | Freq: Once | INTRAVENOUS | Status: AC
  Administered 2014-08-30: 2000 [IU] via INTRAVENOUS
  Filled 2014-08-30: qty 2000

## 2014-08-30 MED ORDER — TACROLIMUS 1 MG PO CAPS
5.0000 mg | ORAL_CAPSULE | Freq: Two times a day (BID) | ORAL | Status: DC
  Administered 2014-08-30 – 2014-09-02 (×6): 5 mg via ORAL
  Filled 2014-08-30 (×7): qty 5

## 2014-08-30 MED ORDER — DOCUSATE SODIUM 100 MG PO CAPS
100.0000 mg | ORAL_CAPSULE | Freq: Two times a day (BID) | ORAL | Status: DC
  Administered 2014-08-31 – 2014-09-01 (×3): 100 mg via ORAL
  Filled 2014-08-30 (×5): qty 1

## 2014-08-30 MED ORDER — ACETAMINOPHEN 325 MG PO TABS: 650.0000 mg | ORAL_TABLET | ORAL | Status: DC | PRN

## 2014-08-30 MED ORDER — SODIUM CHLORIDE 0.9 % IJ SOLN
3.0000 mL | Freq: Two times a day (BID) | INTRAMUSCULAR | Status: DC
  Administered 2014-09-01: 3 mL via INTRAVENOUS

## 2014-08-30 MED ORDER — ONDANSETRON HCL 4 MG PO TABS: 4.0000 mg | ORAL_TABLET | Freq: Four times a day (QID) | ORAL | Status: DC | PRN

## 2014-08-30 MED ORDER — WARFARIN - PHARMACIST DOSING INPATIENT: Freq: Every day | Status: DC

## 2014-08-30 MED ORDER — MORPHINE SULFATE 4 MG/ML IJ SOLN
4.0000 mg | Freq: Once | INTRAMUSCULAR | Status: AC
  Administered 2014-08-30: 4 mg via INTRAVENOUS
  Filled 2014-08-30: qty 1

## 2014-08-30 MED ORDER — WARFARIN SODIUM 7.5 MG PO TABS
7.5000 mg | ORAL_TABLET | Freq: Once | ORAL | Status: AC
  Administered 2014-08-30: 7.5 mg via ORAL
  Filled 2014-08-30: qty 1

## 2014-08-30 MED ORDER — CINACALCET HCL 30 MG PO TABS
30.0000 mg | ORAL_TABLET | Freq: Every day | ORAL | Status: DC
  Administered 2014-08-30 – 2014-09-02 (×4): 30 mg via ORAL
  Filled 2014-08-30 (×4): qty 1

## 2014-08-30 MED ORDER — HEPARIN (PORCINE) IN NACL 100-0.45 UNIT/ML-% IJ SOLN
1250.0000 [IU]/h | INTRAMUSCULAR | Status: DC
  Administered 2014-08-30: 950 [IU]/h via INTRAVENOUS
  Filled 2014-08-30 (×2): qty 250

## 2014-08-30 MED ORDER — POLYETHYLENE GLYCOL 3350 17 G PO PACK: 17.0000 g | PACK | Freq: Every day | ORAL | Status: DC | PRN

## 2014-08-30 MED ORDER — SODIUM CHLORIDE 0.9 % IV SOLN: 100.0000 mL | INTRAVENOUS | Status: DC | PRN

## 2014-08-30 MED ORDER — SODIUM CHLORIDE 0.9 % IV SOLN: 10.0000 mL/h | Freq: Once | INTRAVENOUS | Status: DC

## 2014-08-30 MED ORDER — CARVEDILOL 3.125 MG PO TABS
3.1250 mg | ORAL_TABLET | Freq: Two times a day (BID) | ORAL | Status: DC
  Administered 2014-08-30 (×2): 3.125 mg via ORAL
  Filled 2014-08-30 (×4): qty 1

## 2014-08-30 MED ORDER — ALLOPURINOL 100 MG PO TABS
100.0000 mg | ORAL_TABLET | Freq: Every day | ORAL | Status: DC
  Administered 2014-08-30 – 2014-09-02 (×4): 100 mg via ORAL
  Filled 2014-08-30 (×4): qty 1

## 2014-08-30 MED ORDER — DIAZEPAM 5 MG PO TABS: 5.0000 mg | ORAL_TABLET | Freq: Two times a day (BID) | ORAL | Status: DC

## 2014-08-30 MED ORDER — OXYCODONE-ACETAMINOPHEN 5-325 MG PO TABS
1.0000 | ORAL_TABLET | Freq: Once | ORAL | Status: AC
  Administered 2014-08-30: 1 via ORAL
  Filled 2014-08-30: qty 1

## 2014-08-30 MED ORDER — NITROGLYCERIN 0.4 MG SL SUBL
0.4000 mg | SUBLINGUAL_TABLET | SUBLINGUAL | Status: DC | PRN
  Administered 2014-08-30 (×2): 0.4 mg via SUBLINGUAL
  Filled 2014-08-30: qty 1

## 2014-08-30 MED ORDER — ENOXAPARIN SODIUM 80 MG/0.8ML ~~LOC~~ SOLN
80.0000 mg | SUBCUTANEOUS | Status: DC
  Administered 2014-08-30 – 2014-09-01 (×3): 80 mg via SUBCUTANEOUS
  Filled 2014-08-30 (×3): qty 0.8

## 2014-08-30 MED ORDER — SEVELAMER CARBONATE 800 MG PO TABS
1600.0000 mg | ORAL_TABLET | Freq: Three times a day (TID) | ORAL | Status: DC
  Administered 2014-08-31 – 2014-09-02 (×6): 1600 mg via ORAL
  Filled 2014-08-30 (×7): qty 2

## 2014-08-30 MED ORDER — IOHEXOL 350 MG/ML SOLN
80.0000 mL | Freq: Once | INTRAVENOUS | Status: AC | PRN
  Administered 2014-08-30: 100 mL via INTRAVENOUS

## 2014-08-30 MED ORDER — PREDNISONE 5 MG PO TABS
2.5000 mg | ORAL_TABLET | Freq: Two times a day (BID) | ORAL | Status: DC
  Administered 2014-08-30 – 2014-09-02 (×6): 2.5 mg via ORAL
  Filled 2014-08-30 (×8): qty 1

## 2014-08-30 MED ORDER — AMLODIPINE BESYLATE 10 MG PO TABS
10.0000 mg | ORAL_TABLET | Freq: Every day | ORAL | Status: DC
  Administered 2014-08-30 – 2014-09-02 (×4): 10 mg via ORAL
  Filled 2014-08-30 (×4): qty 1

## 2014-08-30 MED ORDER — HEPARIN SODIUM (PORCINE) 1000 UNIT/ML DIALYSIS: 1000.0000 [IU] | INTRAMUSCULAR | Status: DC | PRN

## 2014-08-30 MED ORDER — ONDANSETRON HCL 4 MG/2ML IJ SOLN
4.0000 mg | Freq: Once | INTRAMUSCULAR | Status: AC
  Administered 2014-08-30: 4 mg via INTRAVENOUS
  Filled 2014-08-30: qty 2

## 2014-08-30 MED ORDER — CLONIDINE HCL 0.3 MG PO TABS
0.3000 mg | ORAL_TABLET | Freq: Three times a day (TID) | ORAL | Status: DC
  Administered 2014-08-30 – 2014-09-02 (×8): 0.3 mg via ORAL
  Filled 2014-08-30 (×8): qty 1

## 2014-08-30 MED ORDER — LISINOPRIL 20 MG PO TABS
20.0000 mg | ORAL_TABLET | Freq: Every day | ORAL | Status: DC
  Administered 2014-08-30 – 2014-09-02 (×4): 20 mg via ORAL
  Filled 2014-08-30 (×4): qty 1

## 2014-08-30 MED ORDER — ALTEPLASE 2 MG IJ SOLR: 2.0000 mg | Freq: Once | INTRAMUSCULAR | Status: AC | PRN

## 2014-08-30 MED ORDER — POLYETHYLENE GLYCOL 3350 17 G PO PACK: 17.0000 g | PACK | Freq: Every day | ORAL | Status: DC

## 2014-08-30 MED ORDER — HYDRALAZINE HCL 50 MG PO TABS
50.0000 mg | ORAL_TABLET | Freq: Three times a day (TID) | ORAL | Status: DC
  Administered 2014-08-30 – 2014-09-02 (×8): 50 mg via ORAL
  Filled 2014-08-30 (×7): qty 1

## 2014-08-30 NOTE — Consult Note (Signed)
Frank [name] 08/30/2014 Pearson Grippe, B Requesting Physician:  Sloan Leiter MD  Reason for Consult:  comanagement of ESRD, anemia HPI:  49 year old male with ESRD on hemodialysis Tuesday, Thursday, Saturday at [facility] via right internal jugular tunnel dialysis catheter presented to the [facility] emergency room yesterday evening with shortness of breath and chest pain. Patient has a recent history of pulmonary embolism and was subtherapeutic on his anticoagulation; CTA of the chest demonstrated no recurrent embolic event. His chest pain is pleuritic in nature and he is being followed by cardiology and internal medicine for this. Next  The patient's hemoglobin is 7.0. I have some treatment records from his dialysis facility including a discharge summary from [facility] at the end of January when he left [facility]. His hemoglobin was in the 8s at that time and he is written for Aranesp with dialysis.   He has by his reports chosen to pursue peritoneal dialysis and has a PD catheter however has not yet started training. He is vague on the plans. He has a history of failed kidney transplant in 2014. At the time of my evaluation he only wished to complain of untreated pain and a long wait to receive his dialysis.    Filed Weights   08/30/14 0058  Weight: 74.39 kg (164 lb)      ROS Balance of 12 systems is negative w/ exceptions as above  Outpt HD Orders Unit: [facility] Days: [facility] Time: 4h Dialyzer: F200  EDW: 158lb K/Ca: 2K / 2.5Ca Access: TDC Needle Size: na BFR/DFR: 350/700 UF Proflie: ? VDRA: Hectorol 49mg qTx BINDER: Renvela 1600 qAC EPO: aranesp 200 q? IV Fe: ? Heparin: 3400 units bolus, then 500/hr Most Recent Phos / PTH: ? Most Recent TSAT / Ferritin: ? Most Recent eKT/V: ? Treatment Adherence: ?  PMH  Past Medical History  Diagnosis Date  . Hypertension   . Depression   . Complication of anesthesia     itching, sore throat  .  Diabetes mellitus without complication     No history per patient, but remains under history as A1c would not be accurate given on dialysis  . Shortness of breath   . Anxiety   . ESRD (end stage renal disease)     due to HTN per patient[facility]  Procedure Laterality Date  . Kidney receipient  2006    failed and started HD in March 2014  . Capd insertion    . Capd removal     FH No family history on file. SH  reports that he has quit smoking. His smoking use included Cigarettes. He has a 1 pack-year smoking history. He has never used smokeless tobacco. He reports that he does not drink alcohol or use illicit drugs. Allergies  Allergies  Allergen Reactions  . Ferrlecit [Na Ferric Gluc Cplx In Sucrose] Shortness Of Breath, Swelling and Other (See Comments)    Swelling in throat  . Darvocet [Propoxyphene N-Acetaminophen] Hives   Home medications Prior to Admission medications   Medication Sig Start Date End Date Taking? Authorizing Provider  cloNIDine (CATAPRES) 0.3 MG tablet Take 1 tablet (0.3 mg total) by mouth 3 (three) times daily. 04/06/14  Yes CDelfina Redwood MD  cyclobenzaprine (FLEXERIL) 10 MG tablet Take 1 tablet (10 mg total) by mouth 3 (three) times daily as needed. 05/05/14  Yes IJanice Norrie MD  diazepam (VALIUM) 5 MG tablet Take 1 tablet (5 mg total) by mouth 2 (two) times daily. 01/25/14  Yes [name], PA-C  acetaminophen (TYLENOL) 325 MG tablet Take 2 tablets (650 mg total) by mouth every 4 (four) hours as needed for mild pain, fever or headache. 04/06/14   Delfina Redwood, MD  allopurinol (ZYLOPRIM) 100 MG tablet Take 100 mg by mouth daily.    Historical Provider, MD  amLODipine (NORVASC) 5 MG tablet Take 10 mg by mouth daily.    Historical Provider, MD  cinacalcet (SENSIPAR) 30 MG tablet Take 30 mg by mouth daily.    Historical Provider, MD  furosemide (LASIX) 80 MG  tablet Take 80 mg by mouth daily.    Historical Provider, MD  hydrALAZINE (APRESOLINE) 25 MG tablet Take 50 mg by mouth 3 (three) times daily.    Historical Provider, MD  isosorbide dinitrate (ISORDIL) 40 MG tablet Take 40 mg by mouth every 8 (eight) hours.    Historical Provider, MD  lisinopril (PRINIVIL,ZESTRIL) 20 MG tablet Take 20 mg by mouth daily.    Historical Provider, MD  minoxidil (LONITEN) 2.5 MG tablet Take 1 tablet (2.5 mg total) by mouth daily. 04/06/14   Delfina Redwood, MD  NIFEdipine (PROCARDIA-XL/ADALAT CC) 30 MG 24 hr tablet Take 1 tablet (30 mg total) by mouth daily. 04/06/14   Delfina Redwood, MD  omeprazole (PRILOSEC) 20 MG capsule Take 20 mg by mouth daily. 07/01/13   Historical Provider, MD  polyethylene glycol (MIRALAX) packet Take 17 g by mouth daily. For constipation 05/12/14   Domenic Polite, MD  predniSONE (DELTASONE) 2.5 MG tablet Take 2.5 mg by mouth 2 (two) times daily with a meal.    Historical Provider, MD  sevelamer carbonate (RENVELA) 800 MG tablet Take 1,600 mg by mouth 3 (three) times daily with meals. 12/27/13 12/27/14  Historical Provider, MD  tacrolimus (PROGRAF) 1 MG capsule Take 5 mg by mouth 2 (two) times daily.    Historical Provider, MD  traMADol (ULTRAM) 50 MG tablet Take 2 tablets (100 mg total) by mouth every 6 (six) hours as needed. 05/05/14   Janice Norrie, MD  warfarin (COUMADIN) 5 MG tablet Take 1 tablet (5 mg total) by mouth one time only at 6 PM. Take 51m daily at 6pm 05/16/14   OTresa Garter MD    Current Medications Scheduled Meds: . atorvastatin  40 mg Oral q1800  . carvedilol  3.125 mg Oral BID WC   Continuous Infusions: . sodium chloride Stopped (08/30/14 0824)  . sodium chloride Stopped (08/30/14 0859)  . heparin 950 Units/hr (08/30/14 0432)   PRN Meds:.nitroGLYCERIN  CBC  Recent Labs Lab 08/30/14 0103 08/30/14 0347  WBC 8.2 7.2  HGB 7.4* 7.0*  HCT 24.3* 22.8*  MCV 74.8* 74.8*  PLT 228 1009  Basic Metabolic  Panel  Recent Labs Lab 08/30/14 0103  NA 135  K 4.4  CL 101  CO2 25  GLUCOSE 85  BUN 67*  CREATININE 13.74*  CALCIUM 7.6*    Physical Exam  Blood pressure 143/89, pulse 73, temperature 97.9 F (36.6 C), temperature source Oral, resp. rate 20, height 6' 1.5" (1.867 m), weight 74.39 kg (164 lb), SpO2 97 %. GEN: NAD ENT: NCAT EYES: EOMI CV: RRR, no rub PULM: CTAB ABD: s/nt/nd SKIN: No rashes/lesions; R IJ TDC bandaged EXT:No LEE  A/P 1. [place] [facility] [place]. Plan for maintenance HD today 2. 4L UF, other parameters unchanged 3. Can  transfused 2 units pRBC with HD 2. HTN/Vol: As above, cont home BP meds for now; bp controlled 3. Anemia:  1. Appears to be worsening.   2. No overt GI losses; follow closely 3. Check Fe panel; has Ferlicit allergy in chart 4. Cont ESA -- Aranesp 200 5. Transfusion today 4. MBD: Cont binder and cinacalcet 5. Presence of PD catheter 6. Pleuritic CP: probably related to recent PE 7. SOB: partiallyr elatetd to #3; cardioogy foll9owing, UF today 8. Chronic anticoagulation: per [facility]  Pearson Grippe MD 08/30/2014, 11:23 AM

## 2014-08-30 NOTE — Consult Note (Signed)
Admit date: 08/30/2014 Referring Physician  Dr. [name] Primary Physician [name], NP Primary Cardiologist  None Reason for Consultation  Chest pain and elevated troponin  HPI: 49 year old male ESRD failed renal tx on HD T, TH, Sat in [place] with PE diagnosis in 04/2014 (LUL), currently no PE on CT, with recurrent history of shortness of breath, leg swelling, atypical chest pain now with mildly elevated troponin of 0.13, 0.11. Has had hypertensive urgency in the past. Also found to be anemic 7.0 today (stools have been darker?).  When asked why he is here today in the emergency department, he tells me shortness of breath. When asked about chest pain he does state that over past 3 days, intermittent left sided chest pain with SOB noted that was worse today. Described as a sharp pain coming and going, no change with NTG but relieved in ER with morphine. He states that when he raises his left arm this may change the pain. Several years ago, he had a cardiac catheterization and he stated that this was normal. He is not interested in further invasive therapies. In fact, he has a right chest wall dialysis catheter in place.  Pain felt similar to prior PE however CT of chest today was negative for PE but questionable RUL PNA noted.     PMH:   Past Medical History  Diagnosis Date  . Hypertension   . Depression   . Complication of anesthesia     itching, sore throat  . Diabetes mellitus without complication     No history per patient, but remains under history as A1c would not be accurate given on dialysis  . Shortness of breath   . Anxiety   . ESRD (end stage renal disease)     due to HTN per patient, followed at [facility], s/p failed kidney transplant - dialysis Tue, Th, Sat    PSH:   Past Surgical History  Procedure Laterality Date  . Kidney receipient  2006    failed and started HD in March 2014  . Capd insertion    . Capd removal     Allergies:  Ferrlecit and Darvocet Prior  to Admit Meds:   Prior to Admission medications   Medication Sig Start Date End Date Taking? Authorizing Provider  acetaminophen (TYLENOL) 325 MG tablet Take 2 tablets (650 mg total) by mouth every 4 (four) hours as needed for mild pain, fever or headache. 04/06/14   Delfina Redwood, MD  allopurinol (ZYLOPRIM) 100 MG tablet Take 100 mg by mouth daily.    Historical Provider, MD  amLODipine (NORVASC) 5 MG tablet Take 10 mg by mouth daily.    Historical Provider, MD  cinacalcet (SENSIPAR) 30 MG tablet Take 30 mg by mouth daily.    Historical Provider, MD  cloNIDine (CATAPRES) 0.3 MG tablet Take 1 tablet (0.3 mg total) by mouth 3 (three) times daily. 04/06/14   Delfina Redwood, MD  cyclobenzaprine (FLEXERIL) 10 MG tablet Take 1 tablet (10 mg total) by mouth 3 (three) times daily as needed. 05/05/14   Janice Norrie, MD  diazepam (VALIUM) 5 MG tablet Take 1 tablet (5 mg total) by mouth 2 (two) times daily. 01/25/14   [name], PA-C  furosemide (LASIX) 80 MG tablet Take 80 mg by mouth daily.    Historical Provider, MD  hydrALAZINE (APRESOLINE) 25 MG tablet Take 50 mg by mouth 3 (three) times daily.    Historical Provider, MD  isosorbide dinitrate (ISORDIL)  40 MG tablet Take 40 mg by mouth every 8 (eight) hours.    Historical Provider, MD  lisinopril (PRINIVIL,ZESTRIL) 20 MG tablet Take 20 mg by mouth daily.    Historical Provider, MD  minoxidil (LONITEN) 2.5 MG tablet Take 1 tablet (2.5 mg total) by mouth daily. 04/06/14   Delfina Redwood, MD  NIFEdipine (PROCARDIA-XL/ADALAT CC) 30 MG 24 hr tablet Take 1 tablet (30 mg total) by mouth daily. 04/06/14   Delfina Redwood, MD  omeprazole (PRILOSEC) 20 MG capsule Take 20 mg by mouth daily. 07/01/13   Historical Provider, MD  polyethylene glycol (MIRALAX) packet Take 17 g by mouth daily. For constipation 05/12/14   Domenic Polite, MD  predniSONE (DELTASONE) 2.5 MG tablet Take 2.5 mg by mouth 2 (two) times daily with a meal.    Historical Provider, MD    sevelamer carbonate (RENVELA) 800 MG tablet Take 1,600 mg by mouth 3 (three) times daily with meals. 12/27/13 12/27/14  Historical Provider, MD  tacrolimus (PROGRAF) 1 MG capsule Take 5 mg by mouth 2 (two) times daily.    Historical Provider, MD  traMADol (ULTRAM) 50 MG tablet Take 2 tablets (100 mg total) by mouth every 6 (six) hours as needed. 05/05/14   Janice Norrie, MD  warfarin (COUMADIN) 5 MG tablet Take 1 tablet (5 mg total) by mouth one time only at 6 PM. Take 76m daily at 6pm 05/16/14   OTresa Garter MD   Fam HX:   No family history on file. Social HX:    History   Social History  . Marital Status: Married    Spouse Name: N/A    Number of Children: 3  . Years of Education: [employer]   Occupational History  . Not on file.   Social History Main Topics  . Smoking status: Former Smoker -- 1.00 packs/day for 1 years    Types: Cigarettes  . Smokeless tobacco: Never Used     Comment: quit Jan 2014  . Alcohol Use: No  . Drug Use: No  . Sexual Activity: Not Currently   Other Topics Concern  . Not on file   Social History Narrative   Owns own plumbing company     ROS:  Denies any syncope, orthopnea, PND. No rashes. No strokelike symptoms. All 11 ROS were addressed and are negative except what is stated in the HPI   Physical Exam: Blood pressure 126/81, pulse 73, temperature 98.3 F (36.8 C), temperature source Oral, resp. rate 15, height 6' 1.5" (1.867 m), weight 164 lb (74.39 kg), SpO2 98 %.   General: Well developed, well nourished, in no acute distress Head: Eyes PERRLA, No xanthomas.   Normal cephalic and atramatic  Lungs:   Clear bilaterally to auscultation and percussion. Normal respiratory effort. No wheezes, no rales. Heart:   HRRR S1 S2 Pulses are 2+ & equal. No murmur, rubs, gallops.  No carotid bruit. No JVD.  No abdominal bruits.  Abdomen: Bowel sounds are positive, abdomen soft and non-tender without masses. No hepatosplenomegaly. Msk:  Back normal. Normal  strength and tone for age. Extremities:  No clubbing, cyanosis . 3+ BLE edema.  DP +1 R sided dialysis catheter, subclavian noted. Chronic ichthyosis skin changes noted Neuro: Alert and oriented X 3, non-focal, MAE x 4 GU: Deferred Rectal: Deferred Psych:  Good affect, responds appropriately, somewhat flat      Labs: Lab Results  Component Value Date   WBC 7.2 08/30/2014   HGB 7.0* 08/30/2014  HCT 22.8* 08/30/2014   MCV 74.8* 08/30/2014   PLT 198 08/30/2014     Recent Labs Lab 08/30/14 0103  NA 135  K 4.4  CL 101  CO2 25  BUN 67*  CREATININE 13.74*  CALCIUM 7.6*  GLUCOSE 85    Recent Labs  08/30/14 0102  TROPONINI 0.13*     Radiology:  Dg Chest 2 View  08/30/2014   CLINICAL DATA:  Left-sided chest pain.  EXAM: CHEST  2 VIEW  COMPARISON:  CT chest 05/08/2014.  Chest 05/08/2014.  FINDINGS: Right-sided central venous catheter is unchanged in position. Borderline heart size with normal pulmonary vascularity. Lungs are clear and expanded. No blunting of costophrenic angles. No pneumothorax. Mediastinal contours appear intact.  IMPRESSION: No active cardiopulmonary disease.   Electronically Signed   By: Lucienne Capers M.D.   On: 08/30/2014 01:35   Ct Angio Chest Pe W/cm &/or Wo Cm  08/30/2014   CLINICAL DATA:  Acute onset of left-sided chest pain and shortness of breath, worsened  EXAM: CT ANGIOGRAPHY CHEST WITH CONTRAST  TECHNIQUE: Multidetector CT imaging of the chest was performed using the standard protocol during bolus administration of intravenous contrast. Multiplanar CT image reconstructions and MIPs were obtained to evaluate the vascular anatomy.  CONTRAST:  176m OMNIPAQUE IOHEXOL 350 MG/ML SOLN  COMPARISON:  CTA of the chest performed 05/08/2014, and chest radiograph performed earlier today at 1:15 a.m.  FINDINGS: There is no evidence of pulmonary embolus.  Mild focal opacity is noted at the right lung base, raising question for mild pneumonia. Minimal left basilar  atelectasis is seen. The lungs are otherwise grossly clear. There is no evidence of pleural effusion or pneumothorax. No masses are identified; no abnormal focal contrast enhancement is seen.  The heart is enlarged. There is mild aneurysmal dilatation of the distal aortic arch, measuring up to 3.7 cm in diameter, which resolves along the descending thoracic aorta. Trace pericardial fluid remains within normal limits. No mediastinal lymphadenopathy is seen. The great vessels are grossly unremarkable. No axillary lymphadenopathy is seen. The visualized portions of the thyroid gland are unremarkable in appearance.  The visualized portions of the liver and spleen are unremarkable. The visualized portions of the pancreas, gallbladder, stomach and adrenal glands are within normal limits. Severe bilateral renal atrophy is noted. Numerous collateral vessels are noted about the right side of the chest.  No acute osseous abnormalities are seen.  Review of the MIP images confirms the above findings.  IMPRESSION: 1. No evidence of pulmonary embolus. 2. Mild focal opacity at the right lung base raises question for mild pneumonia. Minimal left basilar atelectasis seen. 3. Cardiomegaly noted. Mild aneurysmal dilatation of the distal aortic arch, measuring up to 3.7 cm in diameter, which resolves along the descending thoracic aorta. Recommend annual imaging followup by CTA or MRA. This recommendation follows 2010 ACCF/AHA/AATS/ACR/ASA/SCA/SCAI/SIR/STS/SVM Guidelines for the Diagnosis and Management of Patients with Thoracic Aortic Disease. Circulation.2010; 121: eM094-B0964. Severe bilateral renal atrophy noted.   Electronically Signed   By: JGarald BaldingM.D.   On: 08/30/2014 05:56   Personally viewed. Mild aortic atherosclerosis noted abdominal region. No significant coronary calcium.   EKG:  Sinus rhythm with ST depression, repolarization changes in lateral leads. On prior, TWI in lateral leads seen. Personally viewed.    ECHO 08/2012 -normal EF, mild LVH, no wall motion abnormality  ASSESSMENT/PLAN:     49year old with ESRD, post renal transplant (failed) with mildly elevated troponin of 0.11, 0.13 (prior checks  have been normal) with recent PE RUL diagnosis 05/08/14 (CT now negative but possible RUL PNA) with INR of 1, with complaints of chest pain over past 3 days (has had repeated admissions with similar complaint) with Hgb of 7.0.  1. Chest pain/ elevated troponin - his chest pain seems fairly atypical, small localization left sided, intermittent, stabbing, changes with position such as raising his left arm. Troponin however is mildly elevated however this could be secondary to his underlying end-stage renal disease, hypertension (demand ischemia seen in hypertensive urgency for instance). He states that he had had a cardiac catheterization several years ago and this was normal. There is no obvious coronary calcification on CT scan today although this does not exclude the possibility of soft plaque. He is currently chest pain-free, sitting up on side of bed. I discussed with him the potential of invasive therapy such as cardiac catheterization and he was fairly opposed to proceeding in this direction. I went over the risks and benefits of cardiac catheterization including bleeding, stroke, heart attack and once again, he did not wish to proceed if this was suggested. As noted, he does have a tunneled subclavian dialysis catheter in place. When asked about shunt, he did not wish to have any further surgical procedures he stated.  I think it would be reasonable to continue to trend his troponin and if it this remains low level, I would advocate nuclear stress test possibly tomorrow. He seemed amenable to this.  In addition, I would like to see his anemia corrected, I think that administering 1-2 units of packed red blood cells during hemodialysis would be very helpful especially if there is any evidence of possible  acute coronary syndrome. Also, he is getting IV heparin because of a subtherapeutic INR. Please watch for any signs of acute bleeding. GI evaluation may be in order. He denies any melanotic stools although prior history taken by ER mentioned this.  If troponin he comes significantly elevated, I will push further for invasive therapy.  I will order repeat echocardiogram. I will add atorvastatin 40 mg. He has received aspirin. I will add carvedilol 3.125 twice a day  2. PE (04/2015) - he denies missing any Coumadin. His INR was 1.0. Currently on IV heparin. His PE should be treated for at least 6 months, 3 more months remaining. For today, let us hold off on administering Coumadin given the possibility of invasive therapy. Tomorrow, if troponins are level or if he refuses once again invasive therapy, we may resume his Coumadin (unless there is some obvious reason for his hemoglobin of 7).  3. Anemia - Hgb 7, in part this may be secondary to chronic kidney disease however this seems to be less than his normal. 9 previously.  4. ESRD - T, TH, SAT - please transfuse 1-2 units in hemodialysis.  5. Chronic anticoagulation - as above.  Candee Furbish, MD  08/30/2014  6:35 AM

## 2014-08-30 NOTE — ED Notes (Signed)
Checked on pt after call light went off. Pt unplugged himself from monitor and sitting on edge of bed insisting on going to the bathroom to "go #2". Pt asked if he needs assistance to restroom. Pt refused assistance and wants to sit on the edge of the bed for a while. RN Shanon Brow informed of pt's situation.

## 2014-08-30 NOTE — ED Notes (Signed)
PT also refused wheelchair to take him to the bathroom. RN Shanon Brow at bedside.

## 2014-08-30 NOTE — ED Notes (Signed)
Pt monitored by pulse ox, bp cuff, and 12-lead.

## 2014-08-30 NOTE — ED Notes (Signed)
Heparin doses verified with Becky L. RN.

## 2014-08-30 NOTE — Progress Notes (Signed)
ANTICOAGULATION CONSULT NOTE - Initial Consult  Pharmacy Consult for Heparin  Indication: chest pain/ACS  Allergies  Allergen Reactions  . Ferrlecit [Na Ferric Gluc Cplx In Sucrose] Shortness Of Breath, Swelling and Other (See Comments)    Swelling in throat  . Darvocet [Propoxyphene N-Acetaminophen] Hives    Patient Measurements: Height: 6' 1.5" (186.7 cm) Weight: 164 lb (74.39 kg) IBW/kg (Calculated) : 81.05  Vital Signs: Temp: 98.3 F (36.8 C) (02/09 0058) Temp Source: Oral (02/09 0058) BP: 142/89 mmHg (02/09 0235) Pulse Rate: 82 (02/09 0235)  Labs:  Recent Labs  08/30/14 0102 08/30/14 0103  HGB  --  7.4*  HCT  --  24.3*  PLT  --  228  LABPROT 13.4  --   INR 1.01  --   CREATININE  --  13.74*  TROPONINI 0.13*  --     Estimated Creatinine Clearance: 6.8 mL/min (by C-G formula based on Cr of 13.74).   Medical History: Past Medical History  Diagnosis Date  . Hypertension   . Depression   . Complication of anesthesia     itching, sore throat  . Diabetes mellitus without complication     No history per patient, but remains under history as A1c would not be accurate given on dialysis  . Shortness of breath   . Anxiety   . ESRD (end stage renal disease)     due to HTN per patient, followed at [facility], s/p failed kidney transplant - dialysis Tue, Th, Sat   Assessment: Heparin for elevated troponin. Pt is on warfarin PTA but INR is 1.01 on admit. Hgb is low at 7.4 (pt is ESRD on HD). Plts are good. Will give lower bolus given Hgb.   Goal of Therapy:  Heparin level 0.3-0.7 units/ml Monitor platelets by anticoagulation protocol: Yes   Plan:  -Heparin 2000 units BOLUS -Start heparin drip at 950 units/hr -1200 HL -Daily CBC/HL -Trend Hgb closely  Narda Bonds 08/30/2014,3:21 AM

## 2014-08-30 NOTE — Progress Notes (Addendum)
ANTICOAGULATION CONSULT NOTE - Follow Up Consult  Pharmacy Consult for heparin and coumadin Indication: chest pain/ACS  Allergies  Allergen Reactions  . Ferrlecit [Na Ferric Gluc Cplx In Sucrose] Shortness Of Breath, Swelling and Other (See Comments)    Swelling in throat  . Darvocet [Propoxyphene N-Acetaminophen] Hives    Patient Measurements: Height: 6' 1.5" (186.7 cm) Weight: 181 lb (82.1 kg) IBW/kg (Calculated) : 81.05 Heparin Dosing Weight: 86 kg  Vital Signs: Temp: 98.7 F (37.1 C) (02/09 1806) Temp Source: Oral (02/09 1806) BP: 184/96 mmHg (02/09 1806) Pulse Rate: 74 (02/09 1806)  Labs:  Recent Labs  08/30/14 0102 08/30/14 0103 08/30/14 0347 08/30/14 5993 08/30/14 0725 08/30/14 1200 08/30/14 1309  HGB  --  7.4* 7.0*  --   --   --   --   HCT  --  24.3* 22.8*  --   --   --   --   PLT  --  228 198  --   --   --   --   LABPROT 13.4  --   --   --   --   --   --   INR 1.01  --   --   --   --   --   --   HEPARINUNFRC  --   --   --   --   --  <0.10*  --   CREATININE  --  13.74*  --   --   --   --   --   TROPONINI 0.13*  --   --  0.18* 0.11*  --  0.09*    Estimated Creatinine Clearance: 7.4 mL/min (by C-G formula based on Cr of 13.74).   Medications:  Infusions:  . heparin 1,250 Units/hr (08/30/14 1504)    Assessment: 49 y/o male with ESRD on HD who presented to the ED with chest pain and SOB. He has a hx PE (04/2014) and was on warfarin PTA although INR is SUBtherapeutic at 1.01 on admit. Suspect compliance is an issue. He is on IV heparin for chest pain. CTA chest neg for PE.  Home coumadin dose 15m daily.  Goal of Therapy:  Heparin level 0.3-0.7 units/ml; INR 2-3 Monitor platelets by anticoagulation protocol: Yes   Plan:  - Coumadin 7.559mpo today - Continue heparin drip at 1250 units/hr - 8 hr heparin level - Daily INR, heparin level, CBC - Monitor for s/sx of bleeding  CaSherlon HandingPharmD, BCPS Clinical pharmacist, pager [phone]/03/2015 6:56  PM   Addendum: RN called pharmacy -when pt returned from HD ~1850- pt's IV heparin had infiltrated so it has been off since then. Pt is refusing to let RN start another line for the heparin. Since pt is refusing heparin and relatively recent 04/2014 clot, [facility] wants Lovenox even in this ESRD patient. Noted that pt received Lovenox during his initial hospitalization for the PE in 04/2014. Hgb up to 9.4 (s/p PRBC x 2 during HD today).  Goal anti-Xa level (4 hours post dose) 0.6-1 units/ml  Plan: Lovenox 8019mQ q24h Will check anti-Xa level (4 hours post dose) with 4th dose if pt still receiving Will follow carefully for s/s bleeding  CarSherlon HandingharmD, BCPS Clinical pharmacist, pager [phone] 8:23 PM

## 2014-08-30 NOTE — Progress Notes (Signed)
Shift Event: At [place]- notified that pt's IV has infiltrated, pt on IV heparin for PE prophylaxis. He is refusing another IV, stating he would only be willing to take Lovenox injections. Notified pharmacy, Heparin discontinued, ordered Lovenox per pharmacy.  Lacy Duverney [facility]

## 2014-08-30 NOTE — Progress Notes (Signed)
ANTICOAGULATION CONSULT NOTE - Follow Up Consult  Pharmacy Consult for heparin Indication: chest pain/ACS  Allergies  Allergen Reactions  . Ferrlecit [Na Ferric Gluc Cplx In Sucrose] Shortness Of Breath, Swelling and Other (See Comments)    Swelling in throat  . Darvocet [Propoxyphene N-Acetaminophen] Hives    Patient Measurements: Height: 6' 1.5" (186.7 cm) Weight: 189 lb 9.5 oz (86 kg) IBW/kg (Calculated) : 81.05 Heparin Dosing Weight: 86 kg  Vital Signs: Temp: 97.2 F (36.2 C) (02/09 1420) Temp Source: Oral (02/09 1420) BP: 156/86 mmHg (02/09 1420) Pulse Rate: 79 (02/09 1420)  Labs:  Recent Labs  08/30/14 0102 08/30/14 0103 08/30/14 0347 08/30/14 0613 08/30/14 0725 08/30/14 1200  HGB  --  7.4* 7.0*  --   --   --   HCT  --  24.3* 22.8*  --   --   --   PLT  --  228 198  --   --   --   LABPROT 13.4  --   --   --   --   --   INR 1.01  --   --   --   --   --   HEPARINUNFRC  --   --   --   --   --  <0.10*  CREATININE  --  13.74*  --   --   --   --   TROPONINI 0.13*  --   --  0.18* 0.11*  --     Estimated Creatinine Clearance: 7.4 mL/min (by C-G formula based on Cr of 13.74).   Medications:  Infusions:  . sodium chloride Stopped (08/30/14 0824)  . sodium chloride Stopped (08/30/14 0859)  . sodium chloride    . sodium chloride    . sodium chloride    . heparin 950 Units/hr (08/30/14 0432)    Assessment: 49 y/o male with ESRD on HD who presented to the ED with chest pain and SOB. He has a hx PE (04/2014) and was on warfarin PTA although INR is SUBtherapeutic at 1.01 on admit. Suspect compliance is an issue. He was started on IV heparin for chest pain. CTA chest neg for PE.   Heparin level is SUBtherapeutic at <0.1 on 950 units/hr. No bleeding noted, Hb is low but stable in the low 7's, platelets are normal.  Goal of Therapy:  Heparin level 0.3-0.7 units/ml Monitor platelets by anticoagulation protocol: Yes   Plan:  - Heparin 2000 unit IV bolus (lower due  to low Hb) - Increase heparin drip to 1250 units/hr - 8 hr heparin level - Daily heparin level and CBC - Monitor for s/sx of bleeding  [facility], [place].D., BCPS Clinical Pharmacist Pager: [phone] 08/30/2014 2:30 PM

## 2014-08-30 NOTE — Progress Notes (Signed)
Received patient from hemodialysis with IV heparin running in right AC.  Right arm swollen from fingers to biceps.  IV heparin discontinued.  Patient refusing to allow [place] to restart IV in his left arm.  Dr. Sloan Leiter paged with no return call back.  Lequita Asal Physician extender paged with no call back.  Pharmacy notified.  Primary nurse Christina aware.  Will continue to monitor and await call back from extender. Sanda Linger

## 2014-08-30 NOTE — ED Provider Notes (Signed)
CSN: [id number]     Arrival date & time 08/30/14  0046 History   First MD Initiated Contact with Patient 08/30/14 0155     This chart was scribed for Delora Fuel, MD by [name], ED Scribe. This patient was seen in room B15C/B15C and the patient's care was started 1:59 AM.  Chief Complaint  Patient presents with  . Chest Pain   The history is provided by the patient. No language interpreter was used.    HPI Comments: Frank [name] is a 49 y.o. male with a PMHx of HTN, DM, and ESRD who presents to the Emergency Department complaining of intermittent, moderate L sided chest pain with associated mild SOB x 2-3 days that has progressively worsened today. Pain is described as sharp and currently rated 7/10. Pain is exacerbated when in supine position. He has not tried any OTC medications or home remedies to help manage symptoms. No recent fever, chills, diaphoresis, SOB, nausea, vomiting. Stools have been darker than normal. Frank [name] has a history of a blood clot and says current symptoms feel similar. Pt is still on Coumadin and also due for dialysis Tuesday morning 2/9. Pt with known allergies to Ferrlecit and Darvocet.  Past Medical History  Diagnosis Date  . Hypertension   . Depression   . Complication of anesthesia     itching, sore throat  . Diabetes mellitus without complication     No history per patient, but remains under history as A1c would not be accurate given on dialysis  . Shortness of breath   . Anxiety   . ESRD (end stage renal disease)     due to HTN per patient, followed at [facility], s/p failed kidney transplant - dialysis Tue, Th, Sat   Past Surgical History  Procedure Laterality Date  . Kidney receipient  2006    failed and started HD in March 2014  . Capd insertion    . Capd removal     No family history on file. History  Substance Use Topics  . Smoking status: Former Smoker -- 1.00 packs/day for 1 years    Types: Cigarettes  . Smokeless tobacco: Never Used      Comment: quit Jan 2014  . Alcohol Use: No    Review of Systems  Constitutional: Negative for fever, chills and diaphoresis.  Respiratory: Positive for cough and shortness of breath.   Cardiovascular: Positive for chest pain.  Gastrointestinal: Negative for nausea and vomiting.      Allergies  Ferrlecit and Darvocet  Home Medications   Prior to Admission medications   Medication Sig Start Date End Date Taking? Authorizing Provider  acetaminophen (TYLENOL) 325 MG tablet Take 2 tablets (650 mg total) by mouth every 4 (four) hours as needed for mild pain, fever or headache. 04/06/14   Delfina Redwood, MD  allopurinol (ZYLOPRIM) 100 MG tablet Take 100 mg by mouth daily.    Historical Provider, MD  amLODipine (NORVASC) 5 MG tablet Take 10 mg by mouth daily.    Historical Provider, MD  cinacalcet (SENSIPAR) 30 MG tablet Take 30 mg by mouth daily.    Historical Provider, MD  cloNIDine (CATAPRES) 0.3 MG tablet Take 1 tablet (0.3 mg total) by mouth 3 (three) times daily. 04/06/14   Delfina Redwood, MD  cyclobenzaprine (FLEXERIL) 10 MG tablet Take 1 tablet (10 mg total) by mouth 3 (three) times daily as needed. 05/05/14   Janice Norrie, MD  diazepam (VALIUM) 5 MG tablet Take 1  tablet (5 mg total) by mouth 2 (two) times daily. 01/25/14   [name], PA-C  furosemide (LASIX) 80 MG tablet Take 80 mg by mouth daily.    Historical Provider, MD  hydrALAZINE (APRESOLINE) 25 MG tablet Take 50 mg by mouth 3 (three) times daily.    Historical Provider, MD  isosorbide dinitrate (ISORDIL) 40 MG tablet Take 40 mg by mouth every 8 (eight) hours.    Historical Provider, MD  lisinopril (PRINIVIL,ZESTRIL) 20 MG tablet Take 20 mg by mouth daily.    Historical Provider, MD  minoxidil (LONITEN) 2.5 MG tablet Take 1 tablet (2.5 mg total) by mouth daily. 04/06/14   Delfina Redwood, MD  NIFEdipine (PROCARDIA-XL/ADALAT CC) 30 MG 24 hr tablet Take 1 tablet (30 mg total) by mouth daily. 04/06/14   Delfina Redwood,  MD  omeprazole (PRILOSEC) 20 MG capsule Take 20 mg by mouth daily. 07/01/13   Historical Provider, MD  polyethylene glycol (MIRALAX) packet Take 17 g by mouth daily. For constipation 05/12/14   Domenic Polite, MD  predniSONE (DELTASONE) 2.5 MG tablet Take 2.5 mg by mouth 2 (two) times daily with a meal.    Historical Provider, MD  sevelamer carbonate (RENVELA) 800 MG tablet Take 1,600 mg by mouth 3 (three) times daily with meals. 12/27/13 12/27/14  Historical Provider, MD  tacrolimus (PROGRAF) 1 MG capsule Take 5 mg by mouth 2 (two) times daily.    Historical Provider, MD  traMADol (ULTRAM) 50 MG tablet Take 2 tablets (100 mg total) by mouth every 6 (six) hours as needed. 05/05/14   Janice Norrie, MD  warfarin (COUMADIN) 5 MG tablet Take 1 tablet (5 mg total) by mouth one time only at 6 PM. Take 1m daily at 6pm 05/16/14   OTresa Garter MD   Triage Vitals: BP 152/94 mmHg  Pulse 85  Temp(Src) 98.3 F (36.8 C) (Oral)  Resp 14  Ht 6' 1.5" (1.867 m)  Wt 164 lb (74.39 kg)  BMI 21.34 kg/m2  SpO2 98%   Physical Exam  Constitutional: He is oriented to person, place, and time. He appears well-developed and well-nourished.  HENT:  Head: Normocephalic and atraumatic.  Eyes: EOM are normal. Pupils are equal, round, and reactive to light.  Neck: Normal range of motion. No JVD present.  Cardiovascular: Normal rate, regular rhythm, normal heart sounds and intact distal pulses.   No murmur heard. Pulmonary/Chest: Effort normal and breath sounds normal. He has no wheezes. He has no rales. He exhibits no tenderness.  Dialysis access present on R  Abdominal: Soft. Bowel sounds are normal. He exhibits no distension and no mass. There is no tenderness. There is no guarding.  Musculoskeletal: Normal range of motion. He exhibits edema.  1 plus pedal edema bilaterally  Lymphadenopathy:    He has no cervical adenopathy.  Neurological: He is alert and oriented to person, place, and time. No cranial nerve  deficit. Coordination normal.  Skin: Skin is warm and dry. No rash noted.  Psychiatric: He has a normal mood and affect. His behavior is normal. Judgment and thought content normal.  Nursing note and vitals reviewed.   ED Course  Procedures (including critical care time)  DIAGNOSTIC STUDIES: Oxygen Saturation is 98% on RA, Normal by my interpretation.    COORDINATION OF CARE: 2:05 AM-Discussed treatment plan with pt at bedside and pt agreed to plan.     Labs Review Results for orders placed or performed during the hospital encounter of 08/30/14  CBC  Result Value Ref Range   WBC 8.2 4.0 - 10.5 K/uL   RBC 3.25 (L) 4.22 - 5.81 MIL/uL   Hemoglobin 7.4 (L) 13.0 - 17.0 g/dL   HCT 24.3 (L) 39.0 - 52.0 %   MCV 74.8 (L) 78.0 - 100.0 fL   MCH 22.8 (L) 26.0 - 34.0 pg   MCHC 30.5 30.0 - 36.0 g/dL   RDW 20.9 (H) 11.5 - 15.5 %   Platelets 228 150 - 400 K/uL  Basic metabolic panel  Result Value Ref Range   Sodium 135 135 - 145 mmol/L   Potassium 4.4 3.5 - 5.1 mmol/L   Chloride 101 96 - 112 mmol/L   CO2 25 19 - 32 mmol/L   Glucose, Bld 85 70 - 99 mg/dL   BUN 67 (H) 6 - 23 mg/dL   Creatinine, Ser 13.74 (H) 0.50 - 1.35 mg/dL   Calcium 7.6 (L) 8.4 - 10.5 mg/dL   GFR calc non Af Amer 4 (L) >90 mL/min   GFR calc Af Amer 4 (L) >90 mL/min   Anion gap 9 5 - 15  BNP (order ONLY if patient complains of dyspnea/SOB AND you have documented it for THIS visit)  Result Value Ref Range   B Natriuretic Peptide 1924.6 (H) 0.0 - 100.0 pg/mL  Troponin I  Result Value Ref Range   Troponin I 0.13 (H) <0.031 ng/mL  Protime-INR  Result Value Ref Range   Prothrombin Time 13.4 11.6 - 15.2 seconds   INR 1.01 0.00 - 1.49  CBC  Result Value Ref Range   WBC 7.2 4.0 - 10.5 K/uL   RBC 3.05 (L) 4.22 - 5.81 MIL/uL   Hemoglobin 7.0 (L) 13.0 - 17.0 g/dL   HCT 22.8 (L) 39.0 - 52.0 %   MCV 74.8 (L) 78.0 - 100.0 fL   MCH 23.0 (L) 26.0 - 34.0 pg   MCHC 30.7 30.0 - 36.0 g/dL   RDW 21.0 (H) 11.5 - 15.5 %    Platelets 198 150 - 400 K/uL  I-stat troponin, ED (not at [facility])  Result Value Ref Range   Troponin i, poc 0.11 (HH) 0.00 - 0.08 ng/mL   Comment NOTIFIED PHYSICIAN    Comment 3            Imaging Review Dg Chest 2 View  08/30/2014   CLINICAL DATA:  Left-sided chest pain.  EXAM: CHEST  2 VIEW  COMPARISON:  CT chest 05/08/2014.  Chest 05/08/2014.  FINDINGS: Right-sided central venous catheter is unchanged in position. Borderline heart size with normal pulmonary vascularity. Lungs are clear and expanded. No blunting of costophrenic angles. No pneumothorax. Mediastinal contours appear intact.  IMPRESSION: No active cardiopulmonary disease.   Electronically Signed   By: Lucienne Capers M.D.   On: 08/30/2014 01:35   Ct Angio Chest Pe W/cm &/or Wo Cm  08/30/2014   CLINICAL DATA:  Acute onset of left-sided chest pain and shortness of breath, worsened  EXAM: CT ANGIOGRAPHY CHEST WITH CONTRAST  TECHNIQUE: Multidetector CT imaging of the chest was performed using the standard protocol during bolus administration of intravenous contrast. Multiplanar CT image reconstructions and MIPs were obtained to evaluate the vascular anatomy.  CONTRAST:  171m OMNIPAQUE IOHEXOL 350 MG/ML SOLN  COMPARISON:  CTA of the chest performed 05/08/2014, and chest radiograph performed earlier today at 1:15 a.m.  FINDINGS: There is no evidence of pulmonary embolus.  Mild focal opacity is noted at the right lung base, raising question for mild pneumonia. Minimal left basilar  atelectasis is seen. The lungs are otherwise grossly clear. There is no evidence of pleural effusion or pneumothorax. No masses are identified; no abnormal focal contrast enhancement is seen.  The heart is enlarged. There is mild aneurysmal dilatation of the distal aortic arch, measuring up to 3.7 cm in diameter, which resolves along the descending thoracic aorta. Trace pericardial fluid remains within normal limits. No mediastinal lymphadenopathy is seen. The great  vessels are grossly unremarkable. No axillary lymphadenopathy is seen. The visualized portions of the thyroid gland are unremarkable in appearance.  The visualized portions of the liver and spleen are unremarkable. The visualized portions of the pancreas, gallbladder, stomach and adrenal glands are within normal limits. Severe bilateral renal atrophy is noted. Numerous collateral vessels are noted about the right side of the chest.  No acute osseous abnormalities are seen.  Review of the MIP images confirms the above findings.  IMPRESSION: 1. No evidence of pulmonary embolus. 2. Mild focal opacity at the right lung base raises question for mild pneumonia. Minimal left basilar atelectasis seen. 3. Cardiomegaly noted. Mild aneurysmal dilatation of the distal aortic arch, measuring up to 3.7 cm in diameter, which resolves along the descending thoracic aorta. Recommend annual imaging followup by CTA or MRA. This recommendation follows 2010 ACCF/AHA/AATS/ACR/ASA/SCA/SCAI/SIR/STS/SVM Guidelines for the Diagnosis and Management of Patients with Thoracic Aortic Disease. Circulation.2010; 121: W237-S283 4. Severe bilateral renal atrophy noted.   Electronically Signed   By: Garald Balding M.D.   On: 08/30/2014 05:56   Images viewed by me.   EKG Interpretation   Date/Time:  Tuesday August 30 2014 00:50:42 EST Ventricular Rate:  83 PR Interval:  170 QRS Duration: 78 QT Interval:  432 QTC Calculation: 507 R Axis:   -18 Text Interpretation:  Normal sinus rhythm Possible Left atrial enlargement  ST \T\ T wave abnormality, consider lateral ischemia Prolonged QT Abnormal  ECG When compared with ECG of 05/08/2014, QT has lengthened Confirmed by  [facility]  MD, Mikaela Hilgeman ([id number]) on 08/30/2014 1:40:39 AM      MDM   Final diagnoses:  Chest pain, unspecified chest pain type  Elevated troponin  End-stage renal disease on hemodialysis  Microcytic hypochromic anemia  Subtherapeutic international normalized ratio (INR)     Chest pain worrisome for possible cardiac etiology. He is given aspirin and nitroglycerin without relief. He is given morphine with good relief. Old records were reviewed and he has a history of a pulmonary embolism for which he is currently on warfarin therapy. Records from [facility] were reviewed showing that his INR was subtherapeutic 4 days ago. This is repeated today and it is noted to be subtherapeutic again. He was started on heparin which would treat both pulmonary embolism and acute coronary syndrome. Sent for CT angiogram which showed no evidence of pulmonary embolism. There is a questionable area of pneumonia, however this does not fit his clinical picture at all and therefore is not treated as such. Case was discussed with Dr. Marlou Porch of cardiology service who states that he will see the patient consultation requests he be admitted on the hospitalist service. He did request blood transfusion since the patient is anemic with hemoglobin 7.4. On review of his labs from [facility], this is about where his hemoglobin has been recently, but in the setting of possible cardiac chest pain, he needs a higher hemoglobin. Case is discussed with Erin Hearing of [facility] who, after consultation with her attending, agrees to admit the patient.  I personally performed  the services described in this documentation, which was scribed in my presence. The recorded information has been reviewed and is accurate.     Delora Fuel, MD 29/92/42 [id number]

## 2014-08-30 NOTE — H&P (Signed)
[facility] History and Physical                                                                                    Frank [name], is a 49 y.o. male  MRN: [id number]   DOB - 19-Dec-1965  Admit Date - 08/30/2014  Outpatient Primary MD for the patient is [name], NP  With History of -  Past Medical History  Diagnosis Date  . Hypertension   . Depression   . Complication of anesthesia     itching, sore throat  . Diabetes mellitus without complication     No history per patient, but remains under history as A1c would not be accurate given on dialysis  . Shortness of breath   . Anxiety   . ESRD (end stage renal disease)     due to HTN per patient, followed at [facility], s/p failed kidney transplant - dialysis Tue, Th, Sat      Past Surgical History  Procedure Laterality Date  . Kidney receipient  2006    failed and started HD in March 2014  . Capd insertion    . Capd removal      in for   Chief Complaint  Patient presents with  . Chest Pain     HPI Frank [name]  is a 49 y.o. male, with chronic kidney disease on dialysis on T-Th-Sa, recent diagnosis of acute PE involving the left upper lobe in October 2015 and subsequent restarted on Coumadin at that time, has been reported he is diabetic but hemoglobin A1c 5.9 (and patient denies history of diabetes), failed renal transplant on chronic immunosuppression, and former tobacco abuse. He presented to the hospital with complaints of constant left upper chest discomfort radiating to the left shoulder without any associated symptoms such as nausea diaphoresis or dyspnea on exertion. He reports the pain has been constant over the past 2 days. He has had a dry hacking nonproductive cough. He reports discomfort when breathing in and describes this as shortness of breath as well. He also endorses that he has had lower extremity swelling for at least 2 days. When he presented to the ER his INR was subtherapeutic at 1.01 so concerns were for  recurrent pulmonary embolism. CT angio chest was negative for acute PE although did question of possible early right basilar opacity. No abnormal findings in the left chest. Patient had a mild bump in troponin of 0.13 and a pro BNP was checked and was 1924. EKG revealed sinus rhythm with slightly prolonged QTC of 507 ms and lateral T-wave inversion which is unchanged from EKG in October. Patient has been evaluated by cardiology while in the emergency department. Of note patient's hemoglobin was down to 7.0. Baseline back in October was around 8.8 to 9.0. View of records and care everywhere so patient's hemoglobin similarly around 9 to 10 dating back to October through December with a similar recent trend down to around 7. Patient did report to the cardiologist the stools were darker but denied dark or blood tinged stools or other GI symptoms when I questioned him. He denies dietary indiscretion  Review of Systems   In addition  to the HPI above:  No Fever-chills, myalgias or other constitutional symptoms No Headache, changes with Vision or hearing, new weakness, tingling, numbness in any extremity, No problems swallowing food or Liquids, indigestion/reflux No palpitations, orthopnea or DOE No Abdominal pain, N/V; no melena or hematochezia, inconsistently reported dark tarry stools, Bowel movements are regular, No dysuria, hematuria or flank pain No new skin rashes, lesions, masses or bruises, No new joints pains-aches No recent weight gain or loss No polyuria, polydypsia or polyphagia,  *A full 10 point Review of Systems was done, except as stated above, all other Review of Systems were negative.  Social History History  Substance Use Topics  . Smoking status: Former Smoker -- 1.00 packs/day for 1 years    Types: Cigarettes  . Smokeless tobacco: Never Used     Comment: quit Jan 2014  . Alcohol Use: No    Family History Brother with diabetes and on dialysis Mother deceased history of  diabetes  Prior to Admission medications   Medication Sig Start Date End Date Taking? Authorizing Provider  acetaminophen (TYLENOL) 325 MG tablet Take 2 tablets (650 mg total) by mouth every 4 (four) hours as needed for mild pain, fever or headache. 04/06/14   Delfina Redwood, MD  allopurinol (ZYLOPRIM) 100 MG tablet Take 100 mg by mouth daily.    Historical Provider, MD  amLODipine (NORVASC) 5 MG tablet Take 10 mg by mouth daily.    Historical Provider, MD  cinacalcet (SENSIPAR) 30 MG tablet Take 30 mg by mouth daily.    Historical Provider, MD  cloNIDine (CATAPRES) 0.3 MG tablet Take 1 tablet (0.3 mg total) by mouth 3 (three) times daily. 04/06/14   Delfina Redwood, MD  cyclobenzaprine (FLEXERIL) 10 MG tablet Take 1 tablet (10 mg total) by mouth 3 (three) times daily as needed. 05/05/14   Janice Norrie, MD  diazepam (VALIUM) 5 MG tablet Take 1 tablet (5 mg total) by mouth 2 (two) times daily. 01/25/14   [name], PA-C  furosemide (LASIX) 80 MG tablet Take 80 mg by mouth daily.    Historical Provider, MD  hydrALAZINE (APRESOLINE) 25 MG tablet Take 50 mg by mouth 3 (three) times daily.    Historical Provider, MD  isosorbide dinitrate (ISORDIL) 40 MG tablet Take 40 mg by mouth every 8 (eight) hours.    Historical Provider, MD  lisinopril (PRINIVIL,ZESTRIL) 20 MG tablet Take 20 mg by mouth daily.    Historical Provider, MD  minoxidil (LONITEN) 2.5 MG tablet Take 1 tablet (2.5 mg total) by mouth daily. 04/06/14   Delfina Redwood, MD  NIFEdipine (PROCARDIA-XL/ADALAT CC) 30 MG 24 hr tablet Take 1 tablet (30 mg total) by mouth daily. 04/06/14   Delfina Redwood, MD  omeprazole (PRILOSEC) 20 MG capsule Take 20 mg by mouth daily. 07/01/13   Historical Provider, MD  polyethylene glycol (MIRALAX) packet Take 17 g by mouth daily. For constipation 05/12/14   Domenic Polite, MD  predniSONE (DELTASONE) 2.5 MG tablet Take 2.5 mg by mouth 2 (two) times daily with a meal.    Historical Provider, MD    sevelamer carbonate (RENVELA) 800 MG tablet Take 1,600 mg by mouth 3 (three) times daily with meals. 12/27/13 12/27/14  Historical Provider, MD  tacrolimus (PROGRAF) 1 MG capsule Take 5 mg by mouth 2 (two) times daily.    Historical Provider, MD  traMADol (ULTRAM) 50 MG tablet Take 2 tablets (100 mg total) by mouth every 6 (six) hours as needed. 05/05/14  Janice Norrie, MD  warfarin (COUMADIN) 5 MG tablet Take 1 tablet (5 mg total) by mouth one time only at 6 PM. Take 4m daily at 6pm 05/16/14   OTresa Garter MD    Allergies  Allergen Reactions  . Ferrlecit [Na Ferric Gluc Cplx In Sucrose] Shortness Of Breath, Swelling and Other (See Comments)    Swelling in throat  . Darvocet [Propoxyphene N-Acetaminophen] Hives    Physical Exam  Vitals  Blood pressure 147/99, pulse 71, temperature 98.3 F (36.8 C), temperature source Oral, resp. rate 12, height 6' 1.5" (1.867 m), weight 164 lb (74.39 kg), SpO2 100 %.   General:  In no acute distress, appears healthy and well nourished  Psych:  Normal affect, Denies Suicidal or Homicidal ideations, Awake Alert, Oriented X 3. Speech and thought patterns are clear and appropriate, no apparent short term memory deficits  Neuro:   No focal neurological deficits, CN II through XII intact, Strength 5/5 all 4 extremities, Sensation intact all 4 extremities.  ENT:  Ears and Eyes appear Normal, Conjunctivae clear, PER. Moist oral mucosa without erythema or exudates.  Neck:  Supple, No lymphadenopathy appreciated  Respiratory:  Symmetrical chest wall movement, Good air movement bilaterally, CTAB somewhat diminished in the bases due to splinting respiratory effort, a few expiratory fine crackles. Room Air 100%  Cardiac:  RRR, No Murmurs, 2+ bilateral LE edema noted primarily below the knee and involving the feet, no JVD, No carotid bruits, peripheral pulses palpable at 2+  Abdomen:  Positive bowel sounds, Soft, Non tender, Non distended,  No masses  appreciated, no obvious hepatosplenomegaly  Skin:  No Cyanosis, Normal Skin Turgor, No Skin Rash or Bruise.  Extremities: Symmetrical without obvious trauma or injury,  no effusions.  Data Review  CBC  Recent Labs Lab 08/30/14 0103 08/30/14 0347  WBC 8.2 7.2  HGB 7.4* 7.0*  HCT 24.3* 22.8*  PLT 228 198  MCV 74.8* 74.8*  MCH 22.8* 23.0*  MCHC 30.5 30.7  RDW 20.9* 21.0*    Chemistries   Recent Labs Lab 08/30/14 0103  NA 135  K 4.4  CL 101  CO2 25  GLUCOSE 85  BUN 67*  CREATININE 13.74*  CALCIUM 7.6*    estimated creatinine clearance is 6.8 mL/min (by C-G formula based on Cr of 13.74).  No results for input(s): TSH, T4TOTAL, T3FREE, THYROIDAB in the last 72 hours.  Invalid input(s): FREET3  Coagulation profile  Recent Labs Lab 08/30/14 0102  INR 1.01    No results for input(s): DDIMER in the last 72 hours.  Cardiac Enzymes  Recent Labs Lab 08/30/14 0102 08/30/14 0613  TROPONINI 0.13* 0.18*    Invalid input(s): POCBNP  Urinalysis    Component Value Date/Time   COLORURINE YELLOW 10/18/2013 [place]03/30/2015 0419   LABSPEC 1.008 10/18/2013 0419   PHURINE 8.5* 10/18/2013 0419   GLUCOSEU 100* 10/18/2013 0419   HGBUR TRACE* 10/18/2013 0419   BILIRUBINUR NEGATIVE 10/18/2013 0419   KETONESUR NEGATIVE 10/18/2013 0419   PROTEINUR 100* 10/18/2013 0419   UROBILINOGEN 0.2 10/18/2013 0419   NITRITE NEGATIVE 10/18/2013 0419   LEUKOCYTESUR NEGATIVE 10/18/2013 0419    Imaging results:   Dg Chest 2 View  08/30/2014   CLINICAL DATA:  Left-sided chest pain.  EXAM: CHEST  2 VIEW  COMPARISON:  CT chest 05/08/2014.  Chest 05/08/2014.  FINDINGS: Right-sided central venous catheter is unchanged in position. Borderline heart size with normal pulmonary vascularity. Lungs are clear and expanded.  No blunting of costophrenic angles. No pneumothorax. Mediastinal contours appear intact.  IMPRESSION: No active cardiopulmonary disease.    Electronically Signed   By: Lucienne Capers M.D.   On: 08/30/2014 01:35   Ct Angio Chest Pe W/cm &/or Wo Cm  08/30/2014   CLINICAL DATA:  Acute onset of left-sided chest pain and shortness of breath, worsened  EXAM: CT ANGIOGRAPHY CHEST WITH CONTRAST  TECHNIQUE: Multidetector CT imaging of the chest was performed using the standard protocol during bolus administration of intravenous contrast. Multiplanar CT image reconstructions and MIPs were obtained to evaluate the vascular anatomy.  CONTRAST:  176m OMNIPAQUE IOHEXOL 350 MG/ML SOLN  COMPARISON:  CTA of the chest performed 05/08/2014, and chest radiograph performed earlier today at 1:15 a.m.  FINDINGS: There is no evidence of pulmonary embolus.  Mild focal opacity is noted at the right lung base, raising question for mild pneumonia. Minimal left basilar atelectasis is seen. The lungs are otherwise grossly clear. There is no evidence of pleural effusion or pneumothorax. No masses are identified; no abnormal focal contrast enhancement is seen.  The heart is enlarged. There is mild aneurysmal dilatation of the distal aortic arch, measuring up to 3.7 cm in diameter, which resolves along the descending thoracic aorta. Trace pericardial fluid remains within normal limits. No mediastinal lymphadenopathy is seen. The great vessels are grossly unremarkable. No axillary lymphadenopathy is seen. The visualized portions of the thyroid gland are unremarkable in appearance.  The visualized portions of the liver and spleen are unremarkable. The visualized portions of the pancreas, gallbladder, stomach and adrenal glands are within normal limits. Severe bilateral renal atrophy is noted. Numerous collateral vessels are noted about the right side of the chest.  No acute osseous abnormalities are seen.  Review of the MIP images confirms the above findings.  IMPRESSION: 1. No evidence of pulmonary embolus. 2. Mild focal opacity at the right lung base raises question for mild  pneumonia. Minimal left basilar atelectasis seen. 3. Cardiomegaly noted. Mild aneurysmal dilatation of the distal aortic arch, measuring up to 3.7 cm in diameter, which resolves along the descending thoracic aorta. Recommend annual imaging followup by CTA or MRA. This recommendation follows 2010 ACCF/AHA/AATS/ACR/ASA/SCA/SCAI/SIR/STS/SVM Guidelines for the Diagnosis and Management of Patients with Thoracic Aortic Disease. Circulation.2010; 121: eB559-R4164. Severe bilateral renal atrophy noted.   Electronically Signed   By: JGarald BaldingM.D.   On: 08/30/2014 05:56     EKG: Sinus rhythm with slightly prolonged QTC 503 ms, lateral T-wave inversion which is chronic   Assessment & Plan  Active Problems:   Chest pain with pleuritic component -Chest pain is similar to prior PE presentation with recent CT angiogram negative for acute PE -Definitely has pleuritic component and no other typical chest pain symptoms -As precaution we'll consider as ischemic and cycle cardiac enzymes -EKG nonischemic noting chronic T-wave inversion -Insignificant troponin elevation in setting of constant chest pain for 2 days -Appreciate cardiology assistance -See below regarding anticoagulation -Patient describes constant chest pain so admit to stepdown -Continue Isordil as prior to admission -Provide appropriate analgesic    Anemia of chronic kidney failure -Baseline hemoglobin appears to have an average of around 9.0 with current down to 7 -Patient inconsistent in reporting potential GI bleed symptoms such as melena or dark stools -Suspect some of the chest pain may be related to symptomatic anemia therefore transfuse at least 1 unit of packed red blood cells -Check fecal occult blood -Cycle CBC every 12 hours with next due at  1500 today -Monitor closely on anticoagulation -Possibility low hemoglobin has a dilutional component    ESRD on dialysis/failed renal transplant on chronic immunosuppression -Due for  dialysis today; nephrology has been notified -Continue prednisone and Prograf    History of pulmonary embolism/Chronic anticoagulation -INR subtherapeutic at presentation but CTA in June negative for recurrent PE -Continue full dose IV heparin for now but monitor closely regarding evolution of potential GI bleed symptoms (see below) -Currently not hypoxic    Possible right basilar infiltrate on CT/XR -Patient without fever, leukocytosis, productive cough or other signs of pneumonia so will not initiate empiric antibiotics therapy at this time and will follow    Prolonged Q-T interval on ECG -Not on any offending medications prior to admission -Avoid medications such as Haldol, trazodone and her floroquinolones    Gout -Currently asymptomatic    Chronic pain -Continue home medications    Malignant essential hypertension with LVH -Continue home Norvasc, Catapres, hydralazine, Isordil, Zestril, nifedipine -Since dialysis patient monitor BP closely on minoxidil    DVT Prophylaxis: IV heparin  Family Communication:   No family at bedside  Code Status:  Full  Condition:  Stable  Time spent in minutes : 60   Oneisha Ammons L. ANP on 08/30/2014 at 8:05 AM  Between 7am to 7pm - Pager - [phone]  After 7pm go to [email or web address] - password [email or web address]  And look for the night coverage person covering me after hours  [facility]

## 2014-08-30 NOTE — ED Notes (Signed)
MD at bedside. 

## 2014-08-30 NOTE — Progress Notes (Signed)
Came to insert PIV for blood transfusion.  (Has PIV in RT A/C with Heparin infusing).  Pt refusing to let us start a 2nd IV.   They can give me my blood transfusion after my dialysis  Treatment today.  Primary RN aware.

## 2014-08-30 NOTE — ED Notes (Signed)
Had a talk with pt.about cursing staff incafe downstairs.HE CALLING ORDER HIS MEALS.THATS NOT [facility] DIETS.

## 2014-08-30 NOTE — ED Notes (Signed)
Report given to dialysis nurse.

## 2014-08-30 NOTE — ED Notes (Addendum)
Pt. reports left chest pain onset today with SOB that increases when lying on bed , occasional productive cough , denies nausea or diaphoresis.

## 2014-08-30 NOTE — ED Notes (Signed)
Meal tray ordered 

## 2014-08-30 NOTE — ED Notes (Signed)
Renal diet ordered

## 2014-08-30 NOTE — ED Notes (Signed)
Dialysis called to see what time pt will be able to come to Dialysis. Will follow up.

## 2014-08-30 NOTE — Progress Notes (Signed)
Pt c/o shoulder pain -has Tramadol ordered but refuses stating he takes Percocet for pain. MD paged with one time order received. Pt refused orthostatic vital signs. He also states he does not want staff entering his room after he goes to sleep.  Explained to patient that he is on a Cardiac monitor and if needed staff must come in to fix lead placement  etc. Will continue to monitor. Jessie Foot, RN

## 2014-08-30 NOTE — ED Notes (Signed)
Meal tray given to pt.

## 2014-08-30 NOTE — Progress Notes (Addendum)
Pt. doesn't know which meds he is taking and which ones he is not that is on current list.  He gets his medications from somewhere in [place] but doesn't know where.  He has had the following filled in 2015:  07/2013 Oxycod/APAP #90 no refills (08/06/2013)  08/2013 Furosemide 61m #30 no refills (09/01/2013) Mycophenolate (Cellcept) 2572m#60 5 refills  (09/13/2013) Tacrolimus (Prograf) 23m38m 120 5 refills (09/13/2013)  09/2013 Doxycycline 100m81m10 no refills (10/18/2013)  10/2013 Hydrocodone/APAP # 20 no refills (10/25/2013)  12/2013 Amlodipine 10mg13m with 2 refills (01/12/2014)  02/2014 Alprazolam 0.223mg 27m (03/11/2014) No refills  03/2014  Sensipar 30mg #68mith 11 refills (04/19/2014) Clonidine 0.2mg #9060mth 11refills (04/19/2014) Hydralazine 223mg # 126m refills (04/19/2014) Isosorbide Dinitrate 30mg # 9021mrefills (03/24/2014) Labetalol 200mg # 18072mefills (04/19/2014) Lisinopril 20mg # 180 99mfills (04/19/2014) Omeprazole 20mg # 30, 111mfills (04/19/2014) Prednisone 23mg # 60 11 r73mlls (04/19/2014)  04/2014 Renvela 800 mg # 210 11 refills (04/25/2014) SMZ-TMP 400/80mg # 12 5 re623ms (04/25/2014) Warfarin 5 mg #30 2 refills (05/16/2014)  05/2014 Clonidine 0.3 mg patch #4 (11//2015) No refills Hydralazine 100mg #9 no refi56m(05/29/2014) Minoxidil 2.23mg #3 no refill72m11/02/2014)  06/2014 Oxycodone 123mg #30 no refil21m12/24/2015)   Pharmacy cannot confirm actual medications, please review and order meds you feel appropriate for this patient.  Lory Nowaczyk, [facility] Minionical Pharmacist Pager:  [phone] pharmacy to be part of this patients care team.

## 2014-08-30 NOTE — Procedures (Signed)
I was present at this dialysis session. I have reviewed the session itself and made appropriate changes.   Doing well.  TDC.  Goal UF 4L.  To rec 2u PRBC during HD.  Next HD tentative 2/11  Pearson Grippe  MD 08/30/2014, 2:28 PM

## 2014-08-30 NOTE — ED Notes (Signed)
Pt refuses to have second IV established. Dr.Ghamire paged and states pt can have blood transfusion after dialysis through his dialysis cathter.

## 2014-08-31 ENCOUNTER — Encounter (HOSPITAL_COMMUNITY): Payer: Self-pay | Admitting: General Practice

## 2014-08-31 DIAGNOSIS — Z86711 Personal history of pulmonary embolism: Secondary | ICD-10-CM

## 2014-08-31 DIAGNOSIS — Z7901 Long term (current) use of anticoagulants: Secondary | ICD-10-CM

## 2014-08-31 DIAGNOSIS — R791 Abnormal coagulation profile: Secondary | ICD-10-CM

## 2014-08-31 DIAGNOSIS — R0789 Other chest pain: Secondary | ICD-10-CM

## 2014-08-31 LAB — CBC
HCT: 26.7 % — ABNORMAL LOW (ref 39.0–52.0)
HCT: 26.8 % — ABNORMAL LOW (ref 39.0–52.0)
HEMOGLOBIN: 8.7 g/dL — AB (ref 13.0–17.0)
Hemoglobin: 8.5 g/dL — ABNORMAL LOW (ref 13.0–17.0)
MCH: 24 pg — AB (ref 26.0–34.0)
MCH: 23.5 pg — ABNORMAL LOW (ref 26.0–34.0)
MCHC: 32.6 g/dL (ref 30.0–36.0)
MCHC: 31.7 g/dL (ref 30.0–36.0)
MCV: 73.8 fL — ABNORMAL LOW (ref 78.0–100.0)
MCV: 74.2 fL — ABNORMAL LOW (ref 78.0–100.0)
PLATELETS: 203 10*3/uL (ref 150–400)
Platelets: 213 10*3/uL (ref 150–400)
RBC: 3.62 MIL/uL — AB (ref 4.22–5.81)
RBC: 3.61 MIL/uL — ABNORMAL LOW (ref 4.22–5.81)
RDW: 19.3 % — ABNORMAL HIGH (ref 11.5–15.5)
RDW: 19.4 % — AB (ref 11.5–15.5)
WBC: 7.8 10*3/uL (ref 4.0–10.5)
WBC: 6.4 10*3/uL (ref 4.0–10.5)

## 2014-08-31 LAB — TYPE AND SCREEN
ABO/RH(D): B POS
Antibody Screen: NEGATIVE
UNIT DIVISION: 0
Unit division: 0

## 2014-08-31 LAB — BASIC METABOLIC PANEL
ANION GAP: 10 (ref 5–15)
BUN: 30 mg/dL — AB (ref 6–23)
CO2: 23 mmol/L (ref 19–32)
Calcium: 7.3 mg/dL — ABNORMAL LOW (ref 8.4–10.5)
Chloride: 101 mmol/L (ref 96–112)
Creatinine, Ser: 8.2 mg/dL — ABNORMAL HIGH (ref 0.50–1.35)
GFR calc Af Amer: 8 mL/min — ABNORMAL LOW (ref >90)
GFR calc non Af Amer: 7 mL/min — ABNORMAL LOW (ref >90)
GLUCOSE: 80 mg/dL (ref 70–99)
POTASSIUM: 4.6 mmol/L (ref 3.5–5.1)
Sodium: 134 mmol/L — ABNORMAL LOW (ref 135–145)

## 2014-08-31 LAB — PROTIME-INR
INR: 1.08 (ref 0.00–1.49)
Prothrombin Time: 14.1 seconds (ref 11.6–15.2)

## 2014-08-31 MED ORDER — WARFARIN SODIUM 7.5 MG PO TABS
7.5000 mg | ORAL_TABLET | ORAL | Status: AC
  Administered 2014-09-01: 7.5 mg via ORAL
  Filled 2014-08-31: qty 1

## 2014-08-31 MED ORDER — CARVEDILOL 12.5 MG PO TABS
12.5000 mg | ORAL_TABLET | Freq: Two times a day (BID) | ORAL | Status: DC
  Administered 2014-08-31 – 2014-09-02 (×5): 12.5 mg via ORAL
  Filled 2014-08-31 (×5): qty 1

## 2014-08-31 MED ORDER — DARBEPOETIN ALFA 200 MCG/0.4ML IJ SOSY
200.0000 ug | PREFILLED_SYRINGE | INTRAMUSCULAR | Status: DC
  Administered 2014-09-01: 200 ug via INTRAVENOUS
  Filled 2014-08-31: qty 0.4

## 2014-08-31 MED ORDER — OXYCODONE-ACETAMINOPHEN 5-325 MG PO TABS
1.0000 | ORAL_TABLET | Freq: Once | ORAL | Status: AC
  Administered 2014-08-31: 1 via ORAL
  Filled 2014-08-31: qty 1

## 2014-08-31 MED ORDER — OXYCODONE HCL 5 MG PO TABS
10.0000 mg | ORAL_TABLET | Freq: Four times a day (QID) | ORAL | Status: DC | PRN
  Administered 2014-08-31 – 2014-09-02 (×7): 10 mg via ORAL
  Filled 2014-08-31 (×6): qty 2

## 2014-08-31 NOTE — Progress Notes (Signed)
[facility] PROGRESS NOTE  Frank [name] ZOX:[id number] DOB: 1966-01-03 DOA: 08/30/2014 PCP: [name], NP  Assessment/Plan: Chest pain with pleuritic component -Chest pain is similar to prior PE presentation with recent CT angiogram negative for acute PE -As precaution giving risk factors will consider ischemic workup  -troponin slightly elevated, but insignificant in HD patient at this level -EKG nonischemic noting chronic T-wave inversion -per cardiology rec's will check 2-D echo and stress test -will follow rec's -Continue Isordil as prior to admission; patient on full dose lovenox and coumadin -Provide appropriate PRN analgesic -started on coreg   Anemia of chronic kidney failure -Baseline hemoglobin appears to have an average of around 9.0 -status post 2 units of Hgb given on 2/9 with HD -will continue ESA -unable to received IV iron due to hx of allergic reaction -will follow Hgb trend -latest Hgb 8.5 -FOBT pending   ESRD on dialysis/failed renal transplant on chronic immunosuppression -Due for dialysis on 2/11 again -Continue prednisone and Prograf   History of pulmonary embolism/Chronic anticoagulation -INR subtherapeutic at presentation -but CTA negative for recurrent PE -Continue full dose lovenox -continue coumadin per pharmacy   Possible right basilar infiltrate on CT/XR -Patient without fever, leukocytosis, productive cough or other signs of pneumonia  -denies SOB -no antibiotics to be given -most likely mild edema  -will follow after HD -renal aware to maximize UF   Gout -Currently asymptomatic and w/o signs of flare   Chronic pain -Continue PRN oxycodone    Malignant essential hypertension with LVH -Continue home Norvasc, Catapres, hydralazine, Isordil, Zestril, nifedipine -patient also started on coreg as per cardiology rec's -BP a lot better after HD -will monitor closely around tx's -advise to follow low sodium diet   Code  Status: Full Family Communication: no family at bedside Disposition Plan: home when medically stable   Consultants:  Renal service  Nephrology  Procedures:  2-D echo: pending  Myoview: pending   Antibiotics:  None   HPI/Subjective: Still with some pleuritic CP and slight difficulty with deep inspiration; no fever, BP still uncontrolled. Right arm swollen from infiltrated IV and tender to palpation  Objective: Filed Vitals:   08/31/14 1248  BP: 156/91  Pulse:   Temp: 98.2 F (36.8 C)  Resp: 14    Intake/Output Summary (Last 24 hours) at 08/31/14 1647 Last data filed at 08/31/14 0900  Gross per 24 hour  Intake    240 ml  Output   3985 ml  Net  -3745 ml   Filed Weights   08/30/14 1346 08/30/14 1806 08/31/14 0616  Weight: 86 kg (189 lb 9.5 oz) 82.1 kg (181 lb) 82.2 kg (181 lb 3.5 oz)    Exam:   General:  Still with some pleuritic CP and slight difficulty with deep inspiration; no fever, BP still uncontrolled.   Cardiovascular: rate controlled, no rubs or gallops  Respiratory: fine crackles at the bases, no wheezing; otherwise good air movement   Abdomen: soft, NT, ND, positive BS  Musculoskeletal: RUE swelling from IV infiltration, no cyanosis. LEE 1++ bilaterally  Data Reviewed: Basic Metabolic Panel:  Recent Labs Lab 08/30/14 0103 08/31/14 0305  NA 135 134*  K 4.4 4.6  CL 101 101  CO2 25 23  GLUCOSE 85 80  BUN 67* 30*  CREATININE 13.74* 8.20*  CALCIUM 7.6* 7.3*   CBC:  Recent Labs Lab 08/30/14 0103 08/30/14 0347 08/30/14 1930 08/31/14 0305 08/31/14 1405  WBC 8.2 7.2 7.8 7.8 6.4  HGB 7.4* 7.0* 9.7* 8.7*  8.5*  HCT 24.3* 22.8* 29.0* 26.7* 26.8*  MCV 74.8* 74.8* 73.2* 73.8* 74.2*  PLT 228 198 213 213 203   Cardiac Enzymes:  Recent Labs Lab 08/30/14 0102 08/30/14 0613 08/30/14 0725 08/30/14 1309 08/30/14 1930  TROPONINI 0.13* 0.18* 0.11* 0.09* 0.07*   BNP (last 3 results)  Recent Labs  08/30/14 0103  BNP 1924.6*     ProBNP (last 3 results)  Recent Labs  01/02/14 0317 01/18/14 0048 04/15/14 0809  PROBNP >70000.0* >70000.0* >70000.0*    Studies: Dg Chest 2 View  08/30/2014   CLINICAL DATA:  Left-sided chest pain.  EXAM: CHEST  2 VIEW  COMPARISON:  CT chest 05/08/2014.  Chest 05/08/2014.  FINDINGS: Right-sided central venous catheter is unchanged in position. Borderline heart size with normal pulmonary vascularity. Lungs are clear and expanded. No blunting of costophrenic angles. No pneumothorax. Mediastinal contours appear intact.  IMPRESSION: No active cardiopulmonary disease.   Electronically Signed   By: Lucienne Capers M.D.   On: 08/30/2014 01:35   Ct Angio Chest Pe W/cm &/or Wo Cm  08/30/2014   CLINICAL DATA:  Acute onset of left-sided chest pain and shortness of breath, worsened  EXAM: CT ANGIOGRAPHY CHEST WITH CONTRAST  TECHNIQUE: Multidetector CT imaging of the chest was performed using the standard protocol during bolus administration of intravenous contrast. Multiplanar CT image reconstructions and MIPs were obtained to evaluate the vascular anatomy.  CONTRAST:  172m OMNIPAQUE IOHEXOL 350 MG/ML SOLN  COMPARISON:  CTA of the chest performed 05/08/2014, and chest radiograph performed earlier today at 1:15 a.m.  FINDINGS: There is no evidence of pulmonary embolus.  Mild focal opacity is noted at the right lung base, raising question for mild pneumonia. Minimal left basilar atelectasis is seen. The lungs are otherwise grossly clear. There is no evidence of pleural effusion or pneumothorax. No masses are identified; no abnormal focal contrast enhancement is seen.  The heart is enlarged. There is mild aneurysmal dilatation of the distal aortic arch, measuring up to 3.7 cm in diameter, which resolves along the descending thoracic aorta. Trace pericardial fluid remains within normal limits. No mediastinal lymphadenopathy is seen. The great vessels are grossly unremarkable. No axillary lymphadenopathy is  seen. The visualized portions of the thyroid gland are unremarkable in appearance.  The visualized portions of the liver and spleen are unremarkable. The visualized portions of the pancreas, gallbladder, stomach and adrenal glands are within normal limits. Severe bilateral renal atrophy is noted. Numerous collateral vessels are noted about the right side of the chest.  No acute osseous abnormalities are seen.  Review of the MIP images confirms the above findings.  IMPRESSION: 1. No evidence of pulmonary embolus. 2. Mild focal opacity at the right lung base raises question for mild pneumonia. Minimal left basilar atelectasis seen. 3. Cardiomegaly noted. Mild aneurysmal dilatation of the distal aortic arch, measuring up to 3.7 cm in diameter, which resolves along the descending thoracic aorta. Recommend annual imaging followup by CTA or MRA. This recommendation follows 2010 ACCF/AHA/AATS/ACR/ASA/SCA/SCAI/SIR/STS/SVM Guidelines for the Diagnosis and Management of Patients with Thoracic Aortic Disease. Circulation.2010; 121: eM010-U7254. Severe bilateral renal atrophy noted.   Electronically Signed   By: JGarald BaldingM.D.   On: 08/30/2014 05:56    Scheduled Meds: . sodium chloride  10 mL/hr Intravenous Once  . sodium chloride   Intravenous Once  . sodium chloride   Intravenous Once  . allopurinol  100 mg Oral Daily  . amLODipine  10 mg Oral Daily  . atorvastatin  40 mg Oral q1800  . carvedilol  12.5 mg Oral BID WC  . cinacalcet  30 mg Oral QPC supper  . cloNIDine  0.3 mg Oral TID  . [START ON 09/01/2014] darbepoetin (ARANESP) injection - DIALYSIS  200 mcg Intravenous Q Thu-HD  . docusate sodium  100 mg Oral BID  . enoxaparin (LOVENOX) injection  80 mg Subcutaneous Q24H  . hydrALAZINE  50 mg Oral TID  . isosorbide dinitrate  40 mg Oral 3 times per day  . lisinopril  20 mg Oral Daily  . minoxidil  2.5 mg Oral Daily  . NIFEdipine  30 mg Oral Daily  . pantoprazole  40 mg Oral Daily  . predniSONE  2.5  mg Oral BID WC  . sevelamer carbonate  1,600 mg Oral TID WC  . sodium chloride  3 mL Intravenous Q12H  . tacrolimus  5 mg Oral BID  . Warfarin - Pharmacist Dosing Inpatient   Does not apply q1800   Continuous Infusions:   Active Problems:   ESRD on dialysis- Tues, Thurs, Sat in [employer], nephro: [name]   Anemia of chronic kidney failure   Gout   Chronic pain   Chest pain   History of pulmonary embolism   Pleuritic chest pain   Malignant essential hypertension   Chronic anticoagulation   Prolonged Q-T interval on ECG    Time spent: 30 minutes    Barton Dubois  [facility] Pager [phone]. If 7PM-7AM, please contact night-coverage at [email or web address], password [facility] 08/31/2014, 4:47 PM  LOS: 1 day

## 2014-08-31 NOTE — Progress Notes (Signed)
ANTICOAGULATION CONSULT NOTE - Follow Up Consult  Pharmacy Consult for Lovenox/Coumadin Indication: pulmonary embolus  Allergies  Allergen Reactions  . Ferrlecit [Na Ferric Gluc Cplx In Sucrose] Shortness Of Breath, Swelling and Other (See Comments)    Swelling in throat  . Darvocet [Propoxyphene N-Acetaminophen] Hives    Patient Measurements: Height: 6' 1.5" (186.7 cm) Weight: 181 lb 3.5 oz (82.2 kg) (Patient refused to stand on scale.) IBW/kg (Calculated) : 81.05 Heparin Dosing Weight:   Vital Signs: Temp: 98.2 F (36.8 C) (02/10 0616) Temp Source: Oral (02/10 0616) BP: 172/110 mmHg (02/10 0616) Pulse Rate: 82 (02/10 0616)  Labs:  Recent Labs  08/30/14 0102  08/30/14 0103 08/30/14 0347  08/30/14 0725 08/30/14 1200 08/30/14 1309 08/30/14 1930 08/31/14 0305 08/31/14 0556  HGB  --   < > 7.4* 7.0*  --   --   --   --  9.7* 8.7*  --   HCT  --   < > 24.3* 22.8*  --   --   --   --  29.0* 26.7*  --   PLT  --   < > 228 198  --   --   --   --  213 213  --   LABPROT 13.4  --   --   --   --   --   --   --   --   --  14.1  INR 1.01  --   --   --   --   --   --   --   --   --  1.08  HEPARINUNFRC  --   --   --   --   --   --  <0.10*  --   --   --   --   CREATININE  --   --  13.74*  --   --   --   --   --   --  8.20*  --   TROPONINI 0.13*  --   --   --   < > 0.11*  --  0.09* 0.07*  --   --   < > = values in this interval not displayed.  Estimated Creatinine Clearance: 12.4 mL/min (by C-G formula based on Cr of 8.2).  Assessment: CC: 49 y/o male with ESRD on HD who presented to the ED with chest pain and SOB. He has a hx PE (04/2014) and was on warfarin PTA although INR is SUBtherapeutic at 1.01 on admit. Suspect compliance is an issue.  AC: Heparin for recent PE (04/2014) and CP. CTA neg for PE. Heparin>>Lovenox 67m/day for ESRD. INR today 1.08 (- 08/30/14 2nd(2005): RN called and ~1850 when pt returned from HD- pt's IV heparin had infiltrated so it has been off since then. Pt  is refusing to let them start another line for the heparin. Since pt is ADAMANTLY refusing heparin and relatively recent 04/2014 clot - [facility] Lovenox - they are aware of risks with this pt being ESRD. Will need to check anti-Xa level with 4th dose.)  CV: HTN with BP 172/110 Meds: Norvasc10, Lipitor40, Coreg 12.5, Clonidine, Hydralazine, Isordil, lisinopril, minoxidil, Nifedipine,  Endo: DM. Glucose 80  GI/Nutrition:  Neuro: depression, anxiety. Threatening to leave AMA constantly. Requesting Percocet of staff telling them that is what he takes at home for pain. This is not on his med rec. I called [facility] that does not fill Percocet and [facility] that does not [facility] any [facility] for  him. Informed Dr. Dyann Kief.   Renal: ESRD on HD TTS (failed kidney transplant in 2014); allopurinol, sensipar, renvella, prednisone, tacrolimus  Pulm:RA  Heme/Onc: AoCKD, Hgb 7.4 on admit, transfused, has fallen overnight 9.7>8.7.  PTA Med Issues: noncompliance and doesn't know meds  Best Practices:  Goal of Therapy:  Anti-Xa level 0.6-1 units/ml 4hrs after LMWH dose given Monitor platelets by anticoagulation protocol: Yes   Plan:  Lovenox 46m SQ daily. Check level after 4th dose. Coumadin 7.594mpo x 1 tonight.   Farzana Koci S. RoAlford HighlandPharmD, BCPS Clinical Staff Pharmacist Pager [phone] Ghazitillinger 08/31/2014,10:43 AM

## 2014-08-31 NOTE — Progress Notes (Signed)
Subjective: He reports CP which appears to be worse with inspiration.  He says it's been ongoing for months.  His right arm continues to hurt and is worse with palpation.  Objective: Vital signs in last 24 hours: Temp:  [97.2 F (36.2 C)-98.9 F (37.2 C)] 98.2 F (36.8 C) (02/10 0616) Pulse Rate:  [68-84] 82 (02/10 0616) Resp:  [12-22] 20 (02/10 0616) BP: (137-204)/(74-127) 172/110 mmHg (02/10 0616) SpO2:  [94 %-100 %] 97 % (02/10 0616) Weight:  [181 lb (82.1 kg)-189 lb 9.5 oz (86 kg)] 181 lb 3.5 oz (82.2 kg) (02/10 0616) Last BM Date: 08/30/14  Intake/Output from previous day: 02/09 0701 - 02/10 0700 In: 670 [Blood:670] Out: 3985  Intake/Output this shift: Total I/O In: 240 [P.O.:240] Out: -   Medications Current Facility-Administered Medications  Medication Dose Route Frequency Provider Last Rate Last Dose  . 0.9 %  sodium chloride infusion  10 mL/hr Intravenous Once Delora Fuel, MD   Stopped at 08/30/14 [phone]  . 0.9 %  sodium chloride infusion   Intravenous Once [name], NP   Stopped at 08/30/14 [phone]  . 0.9 %  sodium chloride infusion  100 mL Intravenous PRN Rexene Agent, MD      . 0.9 %  sodium chloride infusion  100 mL Intravenous PRN Rexene Agent, MD      . 0.9 %  sodium chloride infusion   Intravenous Once Rexene Agent, MD      . acetaminophen (TYLENOL) tablet 650 mg  650 mg Oral Q4H PRN [name], NP      . allopurinol (ZYLOPRIM) tablet 100 mg  100 mg Oral Daily [name], NP   100 mg at 08/30/14 2058  . amLODipine (NORVASC) tablet 10 mg  10 mg Oral Daily [name], NP   10 mg at 08/30/14 2058  . atorvastatin (LIPITOR) tablet 40 mg  40 mg Oral q1800 Candee Furbish, MD   40 mg at 08/30/14 2056  . carvedilol (COREG) tablet 12.5 mg  12.5 mg Oral BID WC Barton Dubois, MD   12.5 mg at 08/31/14 0803  . cinacalcet (SENSIPAR) tablet 30 mg  30 mg Oral QPC supper [name], NP   30 mg at 08/30/14 2056  . cloNIDine (CATAPRES) tablet 0.3 mg   0.3 mg Oral TID [name], NP   0.3 mg at 08/30/14 2057  . cyclobenzaprine (FLEXERIL) tablet 10 mg  10 mg Oral TID PRN [name], NP      . diazepam (VALIUM) tablet 5 mg  5 mg Oral Q12H PRN Jonetta Osgood, MD      . docusate sodium (COLACE) capsule 100 mg  100 mg Oral BID [name], NP   100 mg at 08/30/14 2200  . enoxaparin (LOVENOX) injection 80 mg  80 mg Subcutaneous Q24H Juanda Chance [place],    80 mg at 08/30/14 2056  . heparin injection 1,000 Units  1,000 Units Dialysis PRN Rexene Agent, MD      . hydrALAZINE (APRESOLINE) tablet 50 mg  50 mg Oral TID [name], NP   50 mg at 08/30/14 2058  . isosorbide dinitrate (ISORDIL) tablet 40 mg  40 mg Oral 3 times per day [name], NP   40 mg at 08/31/14 0618  . lisinopril (PRINIVIL,ZESTRIL) tablet 20 mg  20 mg Oral Daily [name], NP   20 mg at 08/30/14 2058  . minoxidil (LONITEN)  tablet 2.5 mg  2.5 mg Oral Daily [name], NP      . morphine 2 MG/ML injection 1-2 mg  1-2 mg Intravenous Q4H PRN [name], NP      . NIFEdipine (PROCARDIA-XL/ADALAT CC) 24 hr tablet 30 mg  30 mg Oral Daily [name], NP      . nitroGLYCERIN (NITROSTAT) SL tablet 0.4 mg  0.4 mg Sublingual Q5 min PRN Delora Fuel, MD   0.4 mg at 08/30/14 0252  . ondansetron (ZOFRAN) tablet 4 mg  4 mg Oral Q6H PRN [name], NP       Or  . ondansetron [facility]) injection 4 mg  4 mg Intravenous Q6H PRN [name], NP      . pantoprazole (PROTONIX) EC tablet 40 mg  40 mg Oral Daily [name], NP   40 mg at 08/30/14 2058  . polyethylene glycol (MIRALAX / GLYCOLAX) packet 17 g  17 g Oral Daily PRN [name], NP      . predniSONE (DELTASONE) tablet 2.5 mg  2.5 mg Oral BID WC [name], NP   2.5 mg at 08/31/14 0803  . sevelamer carbonate (RENVELA) tablet 1,600 mg  1,600 mg Oral TID WC [name], NP   1,600 mg at 08/31/14 0803  . sodium chloride 0.9 % injection 3 mL  3 mL Intravenous Q12H [name], NP   3 mL at 08/30/14 2200  . tacrolimus (PROGRAF) capsule 5 mg  5 mg Oral BID [name], NP   5 mg at 08/30/14 2056  . traMADol (ULTRAM) tablet 100 mg  100 mg Oral Q6H PRN [name], NP      . Warfarin - Pharmacist Dosing Inpatient   Does not apply q1800 Sindy Gu[place], [facility]        PE: General appearance: alert, cooperative and no distress Lungs: clear to auscultation bilaterally Heart: regular rate and rhythm, S1, S2 normal, no murmur, click, rub or gallop MS:  Very tender below the right deltoid.  No swelling noted.  Mildly tender left of the sternum.  Extremities: 1+ LEE Pulses: 2+ and symmetric Skin: Warm and dry Neurologic: Grossly normal  Lab Results:   Recent Labs  08/30/14 0347 08/30/14 1930 08/31/14 0305  WBC 7.2 7.8 7.8  HGB 7.0* 9.7* 8.7*  HCT 22.8* 29.0* 26.7*  PLT 198 213 213   BMET  Recent Labs  08/30/14 0103 08/31/14 0305  NA 135 134*  K 4.4 4.6  CL 101 101  CO2 25 23  GLUCOSE 85 80  BUN 67* 30*  CREATININE 13.74* 8.20*  CALCIUM 7.6* 7.3*   PT/INR  Recent Labs  08/30/14 0102 08/31/14 0556  LABPROT 13.4 14.1  INR 1.01 1.08    Assessment/Plan  49 year old male ESRD failed renal tx on HD T, TH, Sat in [place] with PE diagnosis in 04/2014 (LUL), currently no PE on CT, with recurrent history of shortness of breath, leg swelling, atypical chest pain now with mildly elevated troponin of 0.13, 0.11. Has had hypertensive urgency in the past. Also found to be anemic 7.0 today (stools have been darker?).  He was admitted with SOB.    Active Problems:   ESRD on dialysis- Tues, Thurs, Sat in [place] [facility], nephro: [name]   Anemia of chronic kidney failure   Gout   Chronic pain   Chest pain   History of pulmonary embolism  Pleuritic chest pain   Malignant essential hypertension   Chronic anticoagulation   Prolonged Q-T interval on ECG  Troponin trending down from 0.11. CP appears noncardiac(worse with breathing and  improved with position change).  Dr. Marlou Porch recommended stress test.  Echo pending.  ASA, coreg statin.  SP two units PRBCs.  Hgb: 7.0>>9.7>>8.7.  Heme check stool.  On IV heparin for PE.  INR subtherapeutic.  Coumadin being given.  Volume status and CHF symptoms improved.  Net fluids:  -3.3L during HD.     LOS: 1 day    HAGER, BRYAN PA-C 08/31/2014 10:04 AM  I have seen and examined the patient along with HAGER, BRYAN PA-C.  I have reviewed the chart, notes and new data.  I agree with PA's note.  Key new complaints: most of the features of his pain suggest pleuritic mechanism Key examination changes: no overt HF, no rub Key new findings / data: ECG with repol changes that are probably LVH, not pericarditis. Troponin increase is nonspecific and marginally significant.  PLAN: Agree with echo and Lexiscan Myoview. Unfortunately, he has already had breakfast -scan tomorrow  Sanda Klein, MD, [facility] [phone] 08/31/2014, 11:15 AM

## 2014-08-31 NOTE — Progress Notes (Signed)
Admit: 08/30/2014 LOS: 1  50M ESRD THS [place] HD admit with pleuritic CP after recent PE, symptoatmic anemia, volume overload  Subjective:  HD yesterday,  3.9L UF Rec 2u PRBC Feels much better today, diminished CP Still feels 'swollen'   02/09 0701 - 02/10 0700 In: 670 [Blood:670] Out: 3985   Filed Weights   08/30/14 1346 08/30/14 1806 08/31/14 0616  Weight: 86 kg (189 lb 9.5 oz) 82.1 kg (181 lb) 82.2 kg (181 lb 3.5 oz)    Scheduled Meds: . sodium chloride  10 mL/hr Intravenous Once  . sodium chloride   Intravenous Once  . sodium chloride   Intravenous Once  . allopurinol  100 mg Oral Daily  . amLODipine  10 mg Oral Daily  . atorvastatin  40 mg Oral q1800  . carvedilol  12.5 mg Oral BID WC  . cinacalcet  30 mg Oral QPC supper  . cloNIDine  0.3 mg Oral TID  . docusate sodium  100 mg Oral BID  . enoxaparin (LOVENOX) injection  80 mg Subcutaneous Q24H  . hydrALAZINE  50 mg Oral TID  . isosorbide dinitrate  40 mg Oral 3 times per day  . lisinopril  20 mg Oral Daily  . minoxidil  2.5 mg Oral Daily  . NIFEdipine  30 mg Oral Daily  . pantoprazole  40 mg Oral Daily  . predniSONE  2.5 mg Oral BID WC  . sevelamer carbonate  1,600 mg Oral TID WC  . sodium chloride  3 mL Intravenous Q12H  . tacrolimus  5 mg Oral BID  . Warfarin - Pharmacist Dosing Inpatient   Does not apply q1800   Continuous Infusions:  PRN Meds:.sodium chloride, sodium chloride, acetaminophen, cyclobenzaprine, diazepam, heparin, morphine injection, nitroGLYCERIN, ondansetron **OR** ondansetron (ZOFRAN) IV, oxyCODONE, polyethylene glycol, traMADol  Current Labs: reviewed    Physical Exam:  Blood pressure 156/91, pulse 82, temperature 98.2 F (36.8 C), temperature source Oral, resp. rate 14, height 6' 1.5" (1.867 m), weight 82.2 kg (181 lb 3.5 oz), SpO2 99 %. GEN: NAD ENT: NCAT EYES: EOMI CV: RRR, no rub PULM: CTAB ABD: s/nt/nd SKIN: No rashes/lesions; R IJ TDC bandaged EXT:No LEE   [facility] Days: [facility] Time: 4h Dialyzer: F200  EDW: 158lb K/Ca: 2K / 2.5Ca Access: TDC Needle Size: na BFR/DFR: 350/700 UF Proflie: ? VDRA: Hectorol 21mg qTx BINDER: Renvela 1600 qAC EPO: aranesp 200 q? IV Fe: ? Heparin: 3400 units bolus, then 500/hr Most Recent Phos / PTH: ? Most Recent TSAT / Ferritin: ? Most Recent eKT/V: ? Treatment Adherence: ?  A/P 1. ESRD: [facility]. Keep on [facility] schedule 2. Aggressive UF next Tx, outpt EDW 158lb and here 181? [facility] HTN/Vol: As above, cont home BP meds for now; bp controlled 3. Anemia:  1. S/p 2u PRBC 2. No overt GI losses; follow closely 3. Has Ferlicit allergy in chart 4. Cont ESA -- Aranesp 200 2/11 4. MBD: Cont binder and cinacalcet 5. Presence of PD catheter 6. Pleuritic CP: probably related to recent PE; improved.  Cardiology planning ST 7. SOB: partiallyr elatetd to #3; cardioogy foll9owing, UF today 8. [facility]  RPearson GrippeMD 08/31/2014, 1:21 PM   Recent Labs Lab 08/30/14 0103 08/31/14 0305  NA 135 134*  K 4.4 4.6  CL 101 101  CO2 25 23  GLUCOSE 85 80  BUN 67* 30*  CREATININE 13.74* 8.20*  CALCIUM 7.6* 7.3*  Recent Labs Lab 08/30/14 0347 08/30/14 1930 08/31/14 0305  WBC 7.2 7.8 7.8  HGB 7.0* 9.7* 8.7*  HCT 22.8* 29.0* 26.7*  MCV 74.8* 73.2* 73.8*  PLT 198 213 213

## 2014-08-31 NOTE — Progress Notes (Signed)
Pt is refusing daily weight at this time--stating" it is to early and he might do it after breakfast",  He has also refused orthostatic VS again this morning. Jessie Foot, RN

## 2014-08-31 NOTE — Progress Notes (Signed)
Utilization review completed. Zeriyah Wain, RN, BSN. 

## 2014-09-01 ENCOUNTER — Inpatient Hospital Stay (HOSPITAL_COMMUNITY): Payer: Medicare Other

## 2014-09-01 ENCOUNTER — Telehealth: Payer: Self-pay | Admitting: Emergency Medicine

## 2014-09-01 DIAGNOSIS — R9439 Abnormal result of other cardiovascular function study: Secondary | ICD-10-CM | POA: Clinically undetermined

## 2014-09-01 DIAGNOSIS — R931 Abnormal findings on diagnostic imaging of heart and coronary circulation: Secondary | ICD-10-CM

## 2014-09-01 DIAGNOSIS — N186 End stage renal disease: Secondary | ICD-10-CM

## 2014-09-01 DIAGNOSIS — R079 Chest pain, unspecified: Secondary | ICD-10-CM

## 2014-09-01 LAB — RENAL FUNCTION PANEL
Albumin: 2.2 g/dL — ABNORMAL LOW (ref 3.5–5.2)
Anion gap: 11 (ref 5–15)
BUN: 45 mg/dL — AB (ref 6–23)
CHLORIDE: 98 mmol/L (ref 96–112)
CO2: 24 mmol/L (ref 19–32)
CREATININE: 11.37 mg/dL — AB (ref 0.50–1.35)
Calcium: 7.1 mg/dL — ABNORMAL LOW (ref 8.4–10.5)
GFR calc Af Amer: 5 mL/min — ABNORMAL LOW (ref >90)
GFR, EST NON AFRICAN AMERICAN: 5 mL/min — AB (ref >90)
Glucose, Bld: 80 mg/dL (ref 70–99)
POTASSIUM: 4.4 mmol/L (ref 3.5–5.1)
Phosphorus: 4.1 mg/dL (ref 2.3–4.6)
Sodium: 133 mmol/L — ABNORMAL LOW (ref 135–145)

## 2014-09-01 LAB — CBC
HEMATOCRIT: 26.7 % — AB (ref 39.0–52.0)
HEMOGLOBIN: 8.5 g/dL — AB (ref 13.0–17.0)
MCH: 24.1 pg — ABNORMAL LOW (ref 26.0–34.0)
MCHC: 31.8 g/dL (ref 30.0–36.0)
MCV: 75.6 fL — ABNORMAL LOW (ref 78.0–100.0)
Platelets: 196 10*3/uL (ref 150–400)
RBC: 3.53 MIL/uL — ABNORMAL LOW (ref 4.22–5.81)
RDW: 19.7 % — ABNORMAL HIGH (ref 11.5–15.5)
WBC: 5.8 10*3/uL (ref 4.0–10.5)

## 2014-09-01 LAB — PROTIME-INR
INR: 1.35 (ref 0.00–1.49)
PROTHROMBIN TIME: 16.9 s — AB (ref 11.6–15.2)

## 2014-09-01 MED ORDER — ALBUTEROL SULFATE (2.5 MG/3ML) 0.083% IN NEBU
INHALATION_SOLUTION | RESPIRATORY_TRACT | Status: AC
  Administered 2014-09-01: 2.5 mg
  Filled 2014-09-01: qty 3

## 2014-09-01 MED ORDER — DARBEPOETIN ALFA 200 MCG/0.4ML IJ SOSY
PREFILLED_SYRINGE | INTRAMUSCULAR | Status: AC
  Filled 2014-09-01: qty 0.4

## 2014-09-01 MED ORDER — TECHNETIUM TC 99M SESTAMIBI GENERIC - CARDIOLITE
10.0000 | Freq: Once | INTRAVENOUS | Status: AC | PRN
  Administered 2014-09-01: 10 via INTRAVENOUS

## 2014-09-01 MED ORDER — DIPHENHYDRAMINE HCL 50 MG/ML IJ SOLN
INTRAMUSCULAR | Status: AC
  Filled 2014-09-01: qty 1

## 2014-09-01 MED ORDER — REGADENOSON 0.4 MG/5ML IV SOLN
INTRAVENOUS | Status: AC
  Administered 2014-09-01: 0.4 mg
  Filled 2014-09-01: qty 5

## 2014-09-01 MED ORDER — OXYCODONE HCL 5 MG PO TABS
ORAL_TABLET | ORAL | Status: AC
  Filled 2014-09-01: qty 2

## 2014-09-01 MED ORDER — ALBUTEROL SULFATE (2.5 MG/3ML) 0.083% IN NEBU: 2.5000 mg | INHALATION_SOLUTION | RESPIRATORY_TRACT | Status: DC | PRN

## 2014-09-01 MED ORDER — DIPHENHYDRAMINE HCL 50 MG/ML IJ SOLN
25.0000 mg | Freq: Once | INTRAMUSCULAR | Status: AC
  Administered 2014-09-01: 25 mg via INTRAMUSCULAR

## 2014-09-01 MED ORDER — WARFARIN SODIUM 7.5 MG PO TABS
7.5000 mg | ORAL_TABLET | Freq: Once | ORAL | Status: DC
  Filled 2014-09-01: qty 1

## 2014-09-01 MED ORDER — HYDROXYZINE HCL 25 MG PO TABS
ORAL_TABLET | ORAL | Status: AC
  Filled 2014-09-01: qty 2

## 2014-09-01 MED ORDER — TECHNETIUM TC 99M SESTAMIBI GENERIC - CARDIOLITE
30.0000 | Freq: Once | INTRAVENOUS | Status: AC | PRN
  Administered 2014-09-01: 30 via INTRAVENOUS

## 2014-09-01 MED ORDER — REGADENOSON 0.4 MG/5ML IV SOLN
0.4000 mg | Freq: Once | INTRAVENOUS | Status: DC
  Filled 2014-09-01: qty 5

## 2014-09-01 MED ORDER — DIPHENHYDRAMINE HCL 50 MG/ML IJ SOLN: 25.0000 mg | Freq: Once | INTRAMUSCULAR | Status: DC

## 2014-09-01 NOTE — Progress Notes (Signed)
[facility] PROGRESS NOTE  Frank [name] QMV:[id number] DOB: 1966-04-28 DOA: 08/30/2014 PCP: [name], NP  Assessment/Plan: Chest pain with pleuritic component -Chest pain is similar to prior PE presentation with recent CT angiogram negative for acute PE -As precaution giving risk factors will performed ischemic workup  -troponin slightly elevated, but insignificant in HD patient at this level -EKG nonischemic noting chronic T-wave inversion -per cardiology rec's will check 2-D echo and nuclear stress test; will follow rec's -Continue Isordil as prior to admission; patient on full dose lovenox and coumadin -Provide appropriate PRN analgesic -continue coreg   Anemia of chronic kidney failure -Baseline hemoglobin appears to have an average of around 9.0 -status post 2 units of Hgb given on 2/9 with HD -will continue ESA as per renal discretion -unable to received IV iron due to hx of allergic reaction -will follow Hgb trend -latest Hgb remains at 8.5 -FOBT pending   ESRD on dialysis/failed renal transplant on chronic immunosuppression -Due for dialysis on 2/11 again -Continue prednisone and Prograf   History of pulmonary embolism/Chronic anticoagulation -INR subtherapeutic at presentation -but CTA negative for recurrent PE -Continue full dose lovenox -continue coumadin per pharmacy   Possible right basilar infiltrate on CT/XR -Patient without fever, leukocytosis, productive cough or other signs of pneumonia  -denies SOB -most likely mild edema  -will monitor off abx's for now. -renal aware to maximize UF   Gout -Currently asymptomatic and w/o signs of flare   Chronic pain -Continue PRN oxycodone    Malignant essential hypertension with LVH -Continue home Norvasc, Catapres, hydralazine, Isordil, Zestril, nifedipine -will continue coreg as per cardiology rec's -BP better; will have HD again today -will monitor closely around HD tx's -advise to follow low  sodium diet   Code Status: Full Family Communication: no family at bedside Disposition Plan: home when medically stable; following results of Nuclear stress test and 2-d echo   Consultants:  Renal service  Nephrology  Procedures:  2-D echo:   Myoview: pending   Antibiotics:  None   HPI/Subjective: Still with some pleuritic CP; but better and improved overall. Good mood and attitude today.  Objective: Filed Vitals:   09/01/14 1524  BP: 170/110  Pulse: 80  Temp: 97.8 F (36.6 C)  Resp: 18    Intake/Output Summary (Last 24 hours) at 09/01/14 1606 Last data filed at 09/01/14 1524  Gross per 24 hour  Intake    240 ml  Output   5000 ml  Net  -4760 ml   Filed Weights   09/01/14 0549 09/01/14 1119 09/01/14 1524  Weight: 83.28 kg (183 lb 9.6 oz) 81.3 kg (179 lb 3.7 oz) 76 kg (167 lb 8.8 oz)    Exam:   General:  Feeling better; more cooperative and in good mood. Still with mild CP and slight discomfort with deep inspiration; no fever, BP still high, but with plans for HD later today.  Cardiovascular: rate controlled, no rubs or gallops  Respiratory: fine crackles at the bases, no wheezing; otherwise good air movement   Abdomen: soft, NT, ND, positive BS  Musculoskeletal: RUE swelling from IV infiltration, no cyanosis. LEE 1++ bilaterally  Data Reviewed: Basic Metabolic Panel:  Recent Labs Lab 08/30/14 0103 08/31/14 0305 09/01/14 1130  NA 135 134* 133*  K 4.4 4.6 4.4  CL 101 101 98  CO2 _0 GLUCOSE 85 80 80  BUN 67* 30* 45*  CREATININE 13.74* 8.20* 11.37*  CALCIUM 7.6* 7.3* 7.1*  PHOS  --   --  4.1   CBC:  Recent Labs Lab 08/30/14 0347 08/30/14 1930 08/31/14 0305 08/31/14 1405 09/01/14 1130  WBC 7.2 7.8 7.8 6.4 5.8  HGB 7.0* 9.7* 8.7* 8.5* 8.5*  HCT 22.8* 29.0* 26.7* 26.8* 26.7*  MCV 74.8* 73.2* 73.8* 74.2* 75.6*  PLT 198 213 213 203 196   Cardiac Enzymes:  Recent Labs Lab 08/30/14 0102 08/30/14 0613 08/30/14 0725  08/30/14 1309 08/30/14 1930  TROPONINI 0.13* 0.18* 0.11* 0.09* 0.07*   BNP (last 3 results)  Recent Labs  08/30/14 0103  BNP 1924.6*    ProBNP (last 3 results)  Recent Labs  01/02/14 0317 01/18/14 0048 04/15/14 0809  PROBNP >70000.0* >70000.0* >70000.0*    Studies: No results found.  Scheduled Meds: . sodium chloride  10 mL/hr Intravenous Once  . sodium chloride   Intravenous Once  . sodium chloride   Intravenous Once  . allopurinol  100 mg Oral Daily  . amLODipine  10 mg Oral Daily  . atorvastatin  40 mg Oral q1800  . carvedilol  12.5 mg Oral BID WC  . cinacalcet  30 mg Oral QPC supper  . cloNIDine  0.3 mg Oral TID  . darbepoetin (ARANESP) injection - DIALYSIS  200 mcg Intravenous Q Thu-HD  . docusate sodium  100 mg Oral BID  . enoxaparin (LOVENOX) injection  80 mg Subcutaneous Q24H  . hydrALAZINE  50 mg Oral TID  . isosorbide dinitrate  40 mg Oral 3 times per day  . lisinopril  20 mg Oral Daily  . minoxidil  2.5 mg Oral Daily  . NIFEdipine  30 mg Oral Daily  . pantoprazole  40 mg Oral Daily  . predniSONE  2.5 mg Oral BID WC  . regadenoson  0.4 mg Intravenous Once  . sevelamer carbonate  1,600 mg Oral TID WC  . sodium chloride  3 mL Intravenous Q12H  . tacrolimus  5 mg Oral BID  . Warfarin - Pharmacist Dosing Inpatient   Does not apply q1800   Continuous Infusions:   Active Problems:   ESRD on dialysis- Tues, Thurs, Sat in [place], nephro: [name]   Anemia of chronic kidney failure   Gout   Chronic pain   Chest pain   History of pulmonary embolism   Pleuritic chest pain   Malignant essential hypertension   Chronic anticoagulation   Prolonged Q-T interval on ECG   Subtherapeutic international normalized ratio (INR)   Time spent: 30 minutes   Barton Dubois  [facility] Pager [phone]. If 7PM-7AM, please contact night-coverage at [email or web address], password [email or web address] 09/01/2014, 4:06 PM  LOS: 2 days

## 2014-09-01 NOTE — Progress Notes (Signed)
Lexiscan myoview completed without complications, + SOB no pain, stable at end of study, nuc results to follow.

## 2014-09-01 NOTE — Procedures (Signed)
I was present at this dialysis session. I have reviewed the session itself and made appropriate changes.   UF goal 5L, BP up.  Nuc ST this AM.  Having some L sided CP.  To rec ESA.  Hb 8.5, stable.   Pearson Grippe  MD 09/01/2014, 11:29 AM

## 2014-09-01 NOTE — Progress Notes (Signed)
Subjective: No pain,no SOB ready for nuc.   Objective: Vital signs in last 24 hours: Temp:  [98.1 F (36.7 C)-98.2 F (36.8 C)] 98.1 F (36.7 C) (02/11 0430) Pulse Rate:  [72-73] 72 (02/11 0430) Resp:  [14-16] 16 (02/11 0430) BP: (129-156)/(73-109) 129/102 mmHg (02/11 0933) SpO2:  [95 %-100 %] 98 % (02/11 0430) Weight:  [183 lb 9.6 oz (83.28 kg)] 183 lb 9.6 oz (83.28 kg) (02/11 0549) Weight change: -5 lb 15.9 oz (-2.72 kg) Last BM Date: 08/30/14 Intake/Output from previous day: -3315 02/10 0701 - 02/11 0700 In: 720 [P.O.:720] Out: 0  Intake/Output this shift:    PE: General:Pleasant affect, NAD, sleepy Skin:Warm and dry, brisk capillary refill HEENT:normocephalic, sclera clear, mucus membranes moist Heart:S1S2 RRR without murmur, gallup, rub or click Lungs:clear, ant, without rales, rhonchi, or wheezes TWS:FKCL, non tender, + BS, do not palpate liver spleen or masses Ext:no lower ext edema, 2+ pedal pulses, 2+ radial pulses Neuro:alert and oriented X 3, MAE, follows commands, + facial symmetry   Lab Results:  Recent Labs  08/31/14 0305 08/31/14 1405  WBC 7.8 6.4  HGB 8.7* 8.5*  HCT 26.7* 26.8*  PLT 213 203   BMET  Recent Labs  08/30/14 0103 08/31/14 0305  NA 135 134*  K 4.4 4.6  CL 101 101  CO2 25 23  GLUCOSE 85 80  BUN 67* 30*  CREATININE 13.74* 8.20*  CALCIUM 7.6* 7.3*    Recent Labs  08/30/14 1309 08/30/14 1930  TROPONINI 0.09* 0.07*    No results found for: CHOL, HDL, LDLCALC, LDLDIRECT, TRIG, CHOLHDL Lab Results  Component Value Date   HGBA1C 5.9* 10/17/2013     Lab Results  Component Value Date   TSH 1.470 12/12/2013      Studies/Results: No results found.  Medications: I have reviewed the patient's current medications. Scheduled Meds: . sodium chloride  10 mL/hr Intravenous Once  . sodium chloride   Intravenous Once  . sodium chloride   Intravenous Once  . allopurinol  100 mg Oral Daily  . amLODipine  10 mg  Oral Daily  . atorvastatin  40 mg Oral q1800  . carvedilol  12.5 mg Oral BID WC  . cinacalcet  30 mg Oral QPC supper  . cloNIDine  0.3 mg Oral TID  . darbepoetin (ARANESP) injection - DIALYSIS  200 mcg Intravenous Q Thu-HD  . diphenhydrAMINE      . docusate sodium  100 mg Oral BID  . enoxaparin (LOVENOX) injection  80 mg Subcutaneous Q24H  . hydrALAZINE  50 mg Oral TID  . isosorbide dinitrate  40 mg Oral 3 times per day  . lisinopril  20 mg Oral Daily  . minoxidil  2.5 mg Oral Daily  . NIFEdipine  30 mg Oral Daily  . pantoprazole  40 mg Oral Daily  . predniSONE  2.5 mg Oral BID WC  . regadenoson  0.4 mg Intravenous Once  . sevelamer carbonate  1,600 mg Oral TID WC  . sodium chloride  3 mL Intravenous Q12H  . tacrolimus  5 mg Oral BID  . Warfarin - Pharmacist Dosing Inpatient   Does not apply q1800   Continuous Infusions:  PRN Meds:.sodium chloride, sodium chloride, acetaminophen, albuterol, cyclobenzaprine, diazepam, heparin, morphine injection, nitroGLYCERIN, ondansetron **OR** ondansetron (ZOFRAN) IV, oxyCODONE, polyethylene glycol, technetium sestamibi generic, traMADol  Assessment/Plan: 49 year old male ESRD failed renal tx on HD T, TH, Sat in [place] with PE diagnosis in  04/2014 (LUL), currently no PE on CT, with recurrent history of shortness of breath, leg swelling, atypical chest pain now with mildly elevated troponin of 0.13, 0.11. Has had hypertensive urgency in the past. Also found to be anemic 7.0 today (stools have been darker?). He was admitted with SOB.   Active Problems:  ESRD on dialysis- Tues, Thurs, Sat in [place], nephro: [name]  Anemia of chronic kidney failure  Gout  Chronic pain  Chest pain- with elevated troponin lexiscan myoview for today  History of pulmonary embolism  Pleuritic chest pain  Malignant essential hypertension  Chronic anticoagulation  Prolonged Q-T interval on ECG  Troponin trending down from 0.11. CP appears  noncardiac(worse with breathing and improved with position change). Dr. Marlou Porch recommended stress test. Echo pending. ASA, coreg statin. SP two units PRBCs. Hgb: 7.0>>9.7>>8.7-->8.5. Heme check stool. On lovenox/coumadin crossover for PE. Volume status and CHF symptoms improved. Net fluids: -3.3L during HD.    LOS: 2 days   Time spent with pt. :15 minutes. [facility] R  Nurse Practitioner Certified Pager [phone] or after 5pm and on weekends call [phone] 09/01/2014, 9:36 AM   I have seen and examined the patient along with [facility] R , NP.  I have reviewed the chart, notes and new data.  I agree with NP's note.  Reviewed nuclear perfusion images (formal report not yet posted) - he has a moderate size, moderately severe reversible perfusion defect in the inferolateral and lateral walls, consistent with circumflex artery stenosis.  PLAN: Coronary angio +/- PCI tomorrow. Patient in hemodialysis currently - will discuss procedure in detail.  Sanda Klein, MD, [place] [phone] 09/01/2014, 3:32 PM

## 2014-09-01 NOTE — Telephone Encounter (Signed)
Trying to rescd [facility] coach

## 2014-09-01 NOTE — Progress Notes (Signed)
ANTICOAGULATION CONSULT NOTE - Follow Up Consult  Pharmacy Consult for Lovenox/Coumadin Indication: pulmonary embolus  Allergies  Allergen Reactions  . Ferrlecit [Na Ferric Gluc Cplx In Sucrose] Shortness Of Breath, Swelling and Other (See Comments)    Swelling in throat  . Darvocet [Propoxyphene N-Acetaminophen] Hives    Patient Measurements: Height: 6' 1.5" (186.7 cm) Weight: 179 lb 3.7 oz (81.3 kg) IBW/kg (Calculated) : 81.05 Heparin Dosing Weight:   Vital Signs: Temp: 98 F (36.7 C) (02/11 1134) Temp Source: Oral (02/11 1134) BP: 173/112 mmHg (02/11 1230) Pulse Rate: 80 (02/11 1230)  Labs:  Recent Labs  08/30/14 0102 08/30/14 0103  08/30/14 0725 08/30/14 1200 08/30/14 1309 08/30/14 1930 08/31/14 0305 08/31/14 0556 08/31/14 1405 09/01/14 1130  HGB  --  7.4*  < >  --   --   --  9.7* 8.7*  --  8.5* 8.5*  HCT  --  24.3*  < >  --   --   --  29.0* 26.7*  --  26.8* 26.7*  PLT  --  228  < >  --   --   --  213 213  --  203 196  LABPROT 13.4  --   --   --   --   --   --   --  14.1  --   --   INR 1.01  --   --   --   --   --   --   --  1.08  --   --   HEPARINUNFRC  --   --   --   --  <0.10*  --   --   --   --   --   --   CREATININE  --  13.74*  --   --   --   --   --  8.20*  --   --  11.37*  TROPONINI 0.13*  --   < > 0.11*  --  0.09* 0.07*  --   --   --   --   < > = values in this interval not displayed.  Estimated Creatinine Clearance: 8.9 mL/min (by C-G formula based on Cr of 11.37).  Assessment: CC: 49 y/o male with ESRD on HD who presented to the ED with chest pain and SOB. He has a hx PE (04/2014) and was on warfarin PTA although INR is SUBtherapeutic at 1.01 on admit. Suspect compliance is an issue.  AC: Heparin for recent PE (04/2014) and CP. CTA neg for PE. Heparin>>Lovenox 23m/day for ESRD. INR today 1.35. --(08/30/14 23JK(0938: RN called and ~1850 when pt returned from HD- pt's IV heparin had infiltrated so it has been off since then. Pt is refusing to let  them start another line for the heparin. Since pt is ADAMANTLY refusing heparin and relatively recent 04/2014 clot - [facility] wants Lovenox - they are aware of risks with this pt being ESRD. Will need to check anti-Xa level with 4th dose.)  CV: HTN with BP 173/111. Stress test 2/11. If negative, may discharge. Meds: Norvasc10, Lipitor40, Coreg 12.5, Clonidine, Hydralazine, Isordil, lisinopril, minoxidil, Nifedipine,  Endo: DM. Glucose 80  Neuro: depression, anxiety. Threatening to leave AMA constantly. Requesting Percocet of staff telling them that is what he takes at home for pain. This is not on his med rec. I called [facility] that does not fill Percocet and [facility] that does not fill any Percocet for him. Informed Dr. MDyann Kief2/10.  Renal: ESRD on  HD TTS (failed kidney transplant in 2014); allopurinol, sensipar, renvella, prednisone, tacrolimus  Pulm:RA  Heme/Onc: AoCKD, Hgb 7.4 on admit, transfused, has fallen overnight 9.7>8.7>8.5.  PTA Med Issues: noncompliance and doesn't know meds  Best Practices:  Goal of Therapy:  Anti-Xa level 0.6-1 units/ml 4hrs after LMWH dose given Monitor platelets by anticoagulation protocol: Yes   Plan:  Lovenox 36m SQ daily. Check level after 4th dose. Coumadin 7.589mpo x 1 again today (give prior to d/c if discharged). If CT negative for any residual PE, can Coumadin be discontinued?   Frank Pottle S. RoAlford HighlandPharmD, BCPS Clinical Staff Pharmacist Pager [phone] Ghazitillinger 09/01/2014,12:49 PM

## 2014-09-02 ENCOUNTER — Encounter (HOSPITAL_COMMUNITY)
Admission: EM | Disposition: A | Discharge status: Home / Self Care | Payer: Self-pay | Source: Home / Self Care | Attending: Internal Medicine

## 2014-09-02 ENCOUNTER — Encounter (HOSPITAL_COMMUNITY): Payer: Self-pay | Admitting: Cardiology

## 2014-09-02 ENCOUNTER — Telehealth: Payer: Self-pay | Admitting: Emergency Medicine

## 2014-09-02 DIAGNOSIS — R072 Precordial pain: Secondary | ICD-10-CM

## 2014-09-02 DIAGNOSIS — M1000 Idiopathic gout, unspecified site: Secondary | ICD-10-CM

## 2014-09-02 DIAGNOSIS — I2510 Atherosclerotic heart disease of native coronary artery without angina pectoris: Secondary | ICD-10-CM

## 2014-09-02 DIAGNOSIS — G8929 Other chronic pain: Secondary | ICD-10-CM

## 2014-09-02 DIAGNOSIS — N185 Chronic kidney disease, stage 5: Secondary | ICD-10-CM

## 2014-09-02 DIAGNOSIS — D631 Anemia in chronic kidney disease: Secondary | ICD-10-CM

## 2014-09-02 HISTORY — PX: LEFT HEART CATHETERIZATION WITH CORONARY ANGIOGRAM: SHX5451

## 2014-09-02 LAB — BASIC METABOLIC PANEL
Anion gap: 8 (ref 5–15)
BUN: 26 mg/dL — ABNORMAL HIGH (ref 6–23)
CHLORIDE: 100 mmol/L (ref 96–112)
CO2: 25 mmol/L (ref 19–32)
Calcium: 7.6 mg/dL — ABNORMAL LOW (ref 8.4–10.5)
Creatinine, Ser: 7.57 mg/dL — ABNORMAL HIGH (ref 0.50–1.35)
GFR, EST AFRICAN AMERICAN: 9 mL/min — AB (ref >90)
GFR, EST NON AFRICAN AMERICAN: 7 mL/min — AB (ref >90)
GLUCOSE: 91 mg/dL (ref 70–99)
POTASSIUM: 5 mmol/L (ref 3.5–5.1)
SODIUM: 133 mmol/L — AB (ref 135–145)

## 2014-09-02 LAB — PROTIME-INR
INR: 1.45 (ref 0.00–1.49)
Prothrombin Time: 17.8 seconds — ABNORMAL HIGH (ref 11.6–15.2)

## 2014-09-02 SURGERY — LEFT HEART CATHETERIZATION WITH CORONARY ANGIOGRAM
Anesthesia: LOCAL

## 2014-09-02 MED ORDER — MIDAZOLAM HCL 2 MG/2ML IJ SOLN
INTRAMUSCULAR | Status: AC
  Filled 2014-09-02: qty 2

## 2014-09-02 MED ORDER — WARFARIN SODIUM 5 MG PO TABS: 7.5000 mg | ORAL_TABLET | Freq: Once | ORAL | Status: DC

## 2014-09-02 MED ORDER — SODIUM CHLORIDE 0.9 % IV SOLN
INTRAVENOUS | Status: DC
Start: 2014-09-02 — End: 2014-09-02

## 2014-09-02 MED ORDER — HEPARIN SODIUM (PORCINE) 1000 UNIT/ML DIALYSIS
20.0000 [IU]/kg | INTRAMUSCULAR | Status: DC | PRN
  Filled 2014-09-02: qty 2

## 2014-09-02 MED ORDER — WARFARIN - PHARMACIST DOSING INPATIENT: Freq: Every day | Status: DC

## 2014-09-02 MED ORDER — ENOXAPARIN SODIUM 80 MG/0.8ML ~~LOC~~ SOLN: 80.0000 mg | SUBCUTANEOUS | Status: DC

## 2014-09-02 MED ORDER — ONDANSETRON HCL 4 MG/2ML IJ SOLN: 4.0000 mg | Freq: Four times a day (QID) | INTRAMUSCULAR | Status: DC | PRN

## 2014-09-02 MED ORDER — WARFARIN SODIUM 7.5 MG PO TABS
7.5000 mg | ORAL_TABLET | Freq: Once | ORAL | Status: AC
  Administered 2014-09-02: 7.5 mg via ORAL
  Filled 2014-09-02: qty 1

## 2014-09-02 MED ORDER — CARVEDILOL 12.5 MG PO TABS: 12.5000 mg | ORAL_TABLET | Freq: Two times a day (BID) | ORAL | Status: DC

## 2014-09-02 MED ORDER — ATORVASTATIN CALCIUM 40 MG PO TABS: 40.0000 mg | ORAL_TABLET | Freq: Every day | ORAL | Status: DC

## 2014-09-02 MED ORDER — SODIUM CHLORIDE 0.9 % IV SOLN: INTRAVENOUS | Status: DC

## 2014-09-02 MED ORDER — SODIUM CHLORIDE 0.9 % IV SOLN: 250.0000 mL | INTRAVENOUS | Status: DC | PRN

## 2014-09-02 MED ORDER — ASPIRIN 81 MG PO CHEW
81.0000 mg | CHEWABLE_TABLET | ORAL | Status: AC
  Administered 2014-09-02: 81 mg via ORAL

## 2014-09-02 MED ORDER — LIDOCAINE HCL (PF) 1 % IJ SOLN
INTRAMUSCULAR | Status: AC
  Filled 2014-09-02: qty 30

## 2014-09-02 MED ORDER — HEPARIN (PORCINE) IN NACL 2-0.9 UNIT/ML-% IJ SOLN
INTRAMUSCULAR | Status: AC
  Filled 2014-09-02: qty 1000

## 2014-09-02 MED ORDER — NITROGLYCERIN 1 MG/10 ML FOR IR/CATH LAB
INTRA_ARTERIAL | Status: AC
  Filled 2014-09-02: qty 10

## 2014-09-02 MED ORDER — FENTANYL CITRATE 0.05 MG/ML IJ SOLN
INTRAMUSCULAR | Status: AC
  Filled 2014-09-02: qty 2

## 2014-09-02 MED ORDER — VERAPAMIL HCL 2.5 MG/ML IV SOLN
INTRAVENOUS | Status: AC
  Filled 2014-09-02: qty 2

## 2014-09-02 MED ORDER — ASPIRIN 81 MG PO CHEW
81.0000 mg | CHEWABLE_TABLET | ORAL | Status: DC
  Filled 2014-09-02: qty 1

## 2014-09-02 MED ORDER — SODIUM CHLORIDE 0.9 % IJ SOLN: 3.0000 mL | INTRAMUSCULAR | Status: DC | PRN

## 2014-09-02 MED ORDER — SODIUM CHLORIDE 0.9 % IJ SOLN: 3.0000 mL | Freq: Two times a day (BID) | INTRAMUSCULAR | Status: DC

## 2014-09-02 MED ORDER — HEPARIN (PORCINE) IN NACL 2-0.9 UNIT/ML-% IJ SOLN
INTRAMUSCULAR | Status: AC
  Filled 2014-09-02: qty 500

## 2014-09-02 MED ORDER — OXYCODONE HCL 5 MG PO TABS: 5.0000 mg | ORAL_TABLET | Freq: Four times a day (QID) | ORAL | Status: DC | PRN

## 2014-09-02 MED ORDER — HEPARIN SODIUM (PORCINE) 1000 UNIT/ML IJ SOLN
INTRAMUSCULAR | Status: AC
  Filled 2014-09-02: qty 1

## 2014-09-02 MED ORDER — SODIUM CHLORIDE 0.9 % IV SOLN
250.0000 mL | INTRAVENOUS | Status: DC | PRN
Start: 2014-09-02 — End: 2014-09-02

## 2014-09-02 NOTE — Discharge Summary (Signed)
Physician Discharge Summary  Frank [name] UEA:[id number] DOB: 20-May-1966 DOA: 08/30/2014  PCP: [name], NP  Admit date: 08/30/2014 Discharge date: 09/02/2014  Time spent: >30 minutes  Recommendations for Outpatient Follow-up:  Needs close follow up of BP and further adjustments  Adjustments on his UF at HD to maximize volume control Follow up CBC to reassess Hgb trend  Discharge Diagnoses:  Principal Problem:   Chest pain with high risk for cardiac etiology Active Problems:   ESRD on dialysis- Tues, Thurs, Sat in [employer] [facility], nephro: [name]   Anemia of chronic kidney failure   Gout   Chronic pain   History of pulmonary embolism   Pleuritic chest pain   Malignant essential hypertension   Chronic anticoagulation   Prolonged Q-T interval on ECG   Elevated troponin   Subtherapeutic international normalized ratio (INR)   Abnormal nuclear stress test   Discharge Condition: stable and improved. Discharge home. Will follow with PCP in 10 days. Had HD on 09/02/14.  Diet recommendation: heart healthy diet  Filed Weights   09/01/14 0549 09/01/14 1119 09/01/14 1524  Weight: 83.28 kg (183 lb 9.6 oz) 81.3 kg (179 lb 3.7 oz) 76 kg (167 lb 8.8 oz)    History of present illness:  49 y.o. male, with chronic kidney disease on dialysis on T-Th-Sa, recent diagnosis of acute PE involving the left upper lobe in October 2015 and subsequent restarted on Coumadin at that time, has been reported he is diabetic but hemoglobin A1c 5.9 (and patient denies history of diabetes), failed renal transplant on chronic immunosuppression, and former tobacco abuse. He presented to the hospital with complaints of constant left upper chest discomfort radiating to the left shoulder without any associated symptoms such as nausea diaphoresis or dyspnea on exertion. He reports the pain has been constant over the past 2 days. He has had a dry hacking nonproductive cough. He reports discomfort when breathing in and  describes this as shortness of breath as well. He also endorses that he has had lower extremity swelling for at least 2 days. When he presented to the ER his INR was subtherapeutic at 1.01 so concerns were for recurrent pulmonary embolism. CT angio chest was negative for acute PE although did question of possible early right basilar opacity. No abnormal findings in the left chest. Patient had a mild bump in troponin of 0.13 and a pro BNP was checked and was 1924.  Hospital Course:  Chest pain with pleuritic component -Chest pain is similar to prior PE presentation with recent CT angiogram negative for acute PE -troponin slightly elevated, but insignificant in HD patient  -EKG nonischemic acute process -2-D echo with cardiomyopathy and combined systolic/diastolic HF (EF 47-82%); high risk Myovew and non obstructive CAD on left heart cath.  -Continue Isordil as prior to admission; patient on full dose lovenox and coumadin (while INR subtherapeutic) -Provide appropriate PRN analgesic -continue coreg   Anemia of chronic kidney failure -Baseline hemoglobin appears to have an average of around 9.0 -status post 2 units of Hgb given on 2/9 with HD -will continue ESA as per renal discretion -unable to received IV iron due to hx of allergic reaction -outpatient follow up of Hgb trend -latest Hgb remains at 8.5   ESRD on dialysis/failed renal transplant on chronic immunosuppression -Due for dialysis on 2/12 again -Continue prednisone and Prograf   History of pulmonary embolism/Chronic anticoagulation -but CTA negative for recurrent PE -continue coumadin with lovenox bridging while INR subtherapeutic  Possible right  basilar infiltrate on CT/XR -Patient without fever, leukocytosis, productive cough or other signs of pneumonia  -denies SOB at discharge -most likely mild edema with CHF exacerbation and volume overload -no abx's needed -renal aware and with plans to maximize UF    Gout -Currently asymptomatic and w/o signs of flare   Chronic pain -Continue PRN oxycodone    Malignant essential hypertension with LVH -Continue home Norvasc, Catapres, hydralazine, nifedipine, minoxidil -will continue coreg as per cardiology rec's -BP better; will have HD again on 2/13 -will continue requiring aggressive UF for volume control -advise to follow low sodium diet  Procedures:  Left cath: non obstructive CAD  2-D echo: EF 40-97%, grade 1 diastolic heart failure; diffuse hypokinesis   Myoview 1. Prior inferior wall infarct with peri-infarct ischemia and moderate reversible defect along the lateral wall. Additional small to moderate reversible defect along the anterior wall.  2. Global hypokinesis with associated left ventricular dilatation.  3. Left ventricular ejection fraction 35%  4. High-risk stress test findings*.  Consultations:  Cardiology  Renal service   Discharge Exam: Filed Vitals:   09/02/14 1630  BP: 140/94  Pulse:   Temp:   Resp:     General: Feeling better; reporting just mild discomfort with deep inspiration; no fever, BP still high but much better  Cardiovascular: rate controlled, no rubs or gallops  Respiratory: no crackles at the bases, no wheezing; good air movement   Abdomen: soft, NT, ND, positive BS  Musculoskeletal: RUE swelling from IV infiltration is pretty much completely resolved at discharge, no cyanosis. LEE 1++ bilaterally.   Discharge Instructions   Discharge Instructions    Diet - low sodium heart healthy    Complete by:  As directed      Discharge instructions    Complete by:  As directed   Take medications as prescribed Follow a low sodium diet Please follow tomorrow morning for HD at your outpatient dialysis unit [facility] follow up visit with PCP in 10 days Follow up with coumadin clinic in 3 days          Current Discharge Medication List    START taking these medications    Details  atorvastatin (LIPITOR) 40 MG tablet Take 1 tablet (40 mg total) by mouth daily at 6 PM. Qty: 30 tablet, Refills: 1    carvedilol (COREG) 12.5 MG tablet Take 1 tablet (12.5 mg total) by mouth 2 (two) times daily with a meal. Qty: 60 tablet, Refills: 1    enoxaparin (LOVENOX) 80 MG/0.8ML injection Inject 0.8 mLs (80 mg total) into the skin daily. Qty: 6 Syringe, Refills: 0    oxyCODONE (OXY IR/ROXICODONE) 5 MG immediate release tablet Take 1-2 tablets (5-10 mg total) by mouth every 6 (six) hours as needed for severe pain. Qty: 45 tablet, Refills: 0      CONTINUE these medications which have CHANGED   Details  warfarin (COUMADIN) 5 MG tablet Take 1.5 tablets (7.5 mg total) by mouth one time only at 6 PM. Take 35m daily at 6pm Qty: 30 tablet, Refills: 0      CONTINUE these medications which have NOT CHANGED   Details  cloNIDine (CATAPRES) 0.3 MG tablet Take 1 tablet (0.3 mg total) by mouth 3 (three) times daily. Qty: 90 tablet, Refills: 0    cyclobenzaprine (FLEXERIL) 10 MG tablet Take 1 tablet (10 mg total) by mouth 3 (three) times daily as needed. Qty: 30 tablet, Refills: 0    diazepam (VALIUM) 5 MG tablet Take  1 tablet (5 mg total) by mouth 2 (two) times daily. Qty: 4 tablet, Refills: 0    acetaminophen (TYLENOL) 325 MG tablet Take 2 tablets (650 mg total) by mouth every 4 (four) hours as needed for mild pain, fever or headache.    allopurinol (ZYLOPRIM) 100 MG tablet Take 100 mg by mouth daily.    amLODipine (NORVASC) 5 MG tablet Take 10 mg by mouth daily.    cinacalcet (SENSIPAR) 30 MG tablet Take 30 mg by mouth daily.    hydrALAZINE (APRESOLINE) 25 MG tablet Take 50 mg by mouth 3 (three) times daily.    isosorbide dinitrate (ISORDIL) 40 MG tablet Take 40 mg by mouth every 8 (eight) hours.    minoxidil (LONITEN) 2.5 MG tablet Take 1 tablet (2.5 mg total) by mouth daily. Qty: 30 tablet, Refills: 0    NIFEdipine (PROCARDIA-XL/ADALAT CC) 30 MG 24 hr tablet Take 1  tablet (30 mg total) by mouth daily. Qty: 30 tablet, Refills: 0    omeprazole (PRILOSEC) 20 MG capsule Take 20 mg by mouth daily.    polyethylene glycol (MIRALAX) packet Take 17 g by mouth daily. For constipation Qty: 14 each, Refills: 0    predniSONE (DELTASONE) 2.5 MG tablet Take 2.5 mg by mouth 2 (two) times daily with a meal.    sevelamer carbonate (RENVELA) 800 MG tablet Take 1,600 mg by mouth 3 (three) times daily with meals.    tacrolimus (PROGRAF) 1 MG capsule Take 5 mg by mouth 2 (two) times daily.    traMADol (ULTRAM) 50 MG tablet Take 2 tablets (100 mg total) by mouth every 6 (six) hours as needed. Qty: 16 tablet, Refills: 0      STOP taking these medications     furosemide (LASIX) 80 MG tablet      lisinopril (PRINIVIL,ZESTRIL) 20 MG tablet        Allergies  Allergen Reactions  . Ferrlecit [Na Ferric Gluc Cplx In Sucrose] Shortness Of Breath, Swelling and Other (See Comments)    Swelling in throat  . Darvocet [Propoxyphene N-Acetaminophen] Hives   Follow-up Information    Follow up with [name], NP. Schedule an appointment as soon as possible for a visit in 10 days.   Specialty:  Internal Medicine   Contact information:   [place] [address] [phone]       The results of significant diagnostics from this hospitalization (including imaging, microbiology, ancillary and laboratory) are listed below for reference.    Significant Diagnostic Studies: Dg Chest 2 View  08/30/2014   CLINICAL DATA:  Left-sided chest pain.  EXAM: CHEST  2 VIEW  COMPARISON:  CT chest 05/08/2014.  Chest 05/08/2014.  FINDINGS: Right-sided central venous catheter is unchanged in position. Borderline heart size with normal pulmonary vascularity. Lungs are clear and expanded. No blunting of costophrenic angles. No pneumothorax. Mediastinal contours appear intact.  IMPRESSION: No active cardiopulmonary disease.   Electronically Signed   By: Lucienne Capers M.D.    On: 08/30/2014 01:35   Ct Angio Chest Pe W/cm &/or Wo Cm  08/30/2014   CLINICAL DATA:  Acute onset of left-sided chest pain and shortness of breath, worsened  EXAM: CT ANGIOGRAPHY CHEST WITH CONTRAST  TECHNIQUE: Multidetector CT imaging of the chest was performed using the standard protocol during bolus administration of intravenous contrast. Multiplanar CT image reconstructions and MIPs were obtained to evaluate the vascular anatomy.  CONTRAST:  128m OMNIPAQUE IOHEXOL 350 MG/ML SOLN  COMPARISON:  CTA of the  chest performed 05/08/2014, and chest radiograph performed earlier today at 1:15 a.m.  FINDINGS: There is no evidence of pulmonary embolus.  Mild focal opacity is noted at the right lung base, raising question for mild pneumonia. Minimal left basilar atelectasis is seen. The lungs are otherwise grossly clear. There is no evidence of pleural effusion or pneumothorax. No masses are identified; no abnormal focal contrast enhancement is seen.  The heart is enlarged. There is mild aneurysmal dilatation of the distal aortic arch, measuring up to 3.7 cm in diameter, which resolves along the descending thoracic aorta. Trace pericardial fluid remains within normal limits. No mediastinal lymphadenopathy is seen. The great vessels are grossly unremarkable. No axillary lymphadenopathy is seen. The visualized portions of the thyroid gland are unremarkable in appearance.  The visualized portions of the liver and spleen are unremarkable. The visualized portions of the pancreas, gallbladder, stomach and adrenal glands are within normal limits. Severe bilateral renal atrophy is noted. Numerous collateral vessels are noted about the right side of the chest.  No acute osseous abnormalities are seen.  Review of the MIP images confirms the above findings.  IMPRESSION: 1. No evidence of pulmonary embolus. 2. Mild focal opacity at the right lung base raises question for mild pneumonia. Minimal left basilar atelectasis seen. 3.  Cardiomegaly noted. Mild aneurysmal dilatation of the distal aortic arch, measuring up to 3.7 cm in diameter, which resolves along the descending thoracic aorta. Recommend annual imaging followup by CTA or MRA. This recommendation follows 2010 ACCF/AHA/AATS/ACR/ASA/SCA/SCAI/SIR/STS/SVM Guidelines for the Diagnosis and Management of Patients with Thoracic Aortic Disease. Circulation.2010; 121: J030-S923 4. Severe bilateral renal atrophy noted.   Electronically Signed   By: Garald Balding M.D.   On: 08/30/2014 05:56   Nm Myocar Multi W/spect W/wall Motion / Ef  09/01/2014   CLINICAL DATA:  Chest pain  EXAM: MYOCARDIAL IMAGING WITH SPECT (REST AND PHARMACOLOGIC-STRESS)  GATED LEFT VENTRICULAR WALL MOTION STUDY  LEFT VENTRICULAR EJECTION FRACTION  TECHNIQUE: Standard myocardial SPECT imaging was performed after resting intravenous injection of 10 mCi Tc-73msestamibi. Subsequently, intravenous infusion of Lexiscan was performed under the supervision of the Cardiology staff. At peak effect of the drug, 30 mCi Tc-975mestamibi was injected intravenously and standard myocardial SPECT imaging was performed. Quantitative gated imaging was also performed to evaluate left ventricular wall motion, and estimate left ventricular ejection fraction.  COMPARISON:  None.  FINDINGS: Perfusion: Fixed defect in the inferior wall with associated peri-infarct ischemia and moderate reversible defect along the lateral wall. Additional small to moderate reversible defect along the anterior wall.  Wall Motion: Global hypokinesis with associated left ventricular dilatation.  Left Ventricular Ejection Fraction: 35 %  End diastolic volume 23300l  End systolic volume 15762l  IMPRESSION: 1. Prior inferior wall infarct with peri-infarct ischemia and moderate reversible defect along the lateral wall. Additional small to moderate reversible defect along the anterior wall.  2. Global hypokinesis with associated left ventricular dilatation.  3. Left  ventricular ejection fraction 35%  4. High-risk stress test findings*.  *2012 Appropriate Use Criteria for Coronary Revascularization Focused Update: J Am Coll Cardiol. 202633;35(4):562-563http://content.[email or web address]?articleid=1201161  These results will be called to the ordering clinician or representative by the Radiologist Assistant, and communication documented in the PACS or zVision Dashboard.   Electronically Signed   By: SrJulian Hy.D.   On: 09/01/2014 16:36   Labs: Basic Metabolic Panel:  Recent Labs Lab 08/30/14 0103 08/31/14 0305 09/01/14 1130 09/02/14 0358  NA 135 134* 133*  133*  K 4.4 4.6 4.4 5.0  CL 101 101 98 100  CO2 _0 GLUCOSE 85 80 80 91  BUN 67* 30* 45* 26*  CREATININE 13.74* 8.20* 11.37* 7.57*  CALCIUM 7.6* 7.3* 7.1* 7.6*  PHOS  --   --  4.1  --    Liver Function Tests:  Recent Labs Lab 09/01/14 1130  ALBUMIN 2.2*   CBC:  Recent Labs Lab 08/30/14 0347 08/30/14 1930 08/31/14 0305 08/31/14 1405 09/01/14 1130  WBC 7.2 7.8 7.8 6.4 5.8  HGB 7.0* 9.7* 8.7* 8.5* 8.5*  HCT 22.8* 29.0* 26.7* 26.8* 26.7*  MCV 74.8* 73.2* 73.8* 74.2* 75.6*  PLT 198 213 213 203 196   Cardiac Enzymes:  Recent Labs Lab 08/30/14 0102 08/30/14 0613 08/30/14 0725 08/30/14 1309 08/30/14 1930  TROPONINI 0.13* 0.18* 0.11* 0.09* 0.07*   BNP: BNP (last 3 results)  Recent Labs  08/30/14 0103  BNP 1924.6*    ProBNP (last 3 results)  Recent Labs  01/02/14 0317 01/18/14 0048 04/15/14 0809  PROBNP >70000.0* >70000.0* >70000.0*    Signed:  Barton Dubois  [facility] 09/02/2014, 5:47 PM

## 2014-09-02 NOTE — Progress Notes (Signed)
ANTICOAGULATION CONSULT NOTE - Follow Up Consult  Pharmacy Consult for Lovenox/Coumadin Indication: pulmonary embolus  Allergies  Allergen Reactions  . Ferrlecit [Na Ferric Gluc Cplx In Sucrose] Shortness Of Breath, Swelling and Other (See Comments)    Swelling in throat  . Darvocet [Propoxyphene N-Acetaminophen] Hives    Patient Measurements: Height: 6' 1.5" (186.7 cm) Weight: 167 lb 8.8 oz (76 kg) IBW/kg (Calculated) : 81.05 Heparin Dosing Weight:   Vital Signs: Temp: 98.6 F (37 C) (02/12 0702) Temp Source: Oral (02/12 0702) BP: 143/93 mmHg (02/12 1515) Pulse Rate: 71 (02/12 1351)  Labs:  Recent Labs  08/30/14 1930 08/31/14 0305 08/31/14 0556 08/31/14 1405 09/01/14 0500 09/01/14 1130 09/02/14 0358  HGB 9.7* 8.7*  --  8.5*  --  8.5*  --   HCT 29.0* 26.7*  --  26.8*  --  26.7*  --   PLT 213 213  --  203  --  196  --   LABPROT  --   --  14.1  --  16.9*  --  17.8*  INR  --   --  1.08  --  1.35  --  1.45  CREATININE  --  8.20*  --   --   --  11.37* 7.57*  TROPONINI 0.07*  --   --   --   --   --   --     Estimated Creatinine Clearance: 12.5 mL/min (by C-G formula based on Cr of 7.57).  Assessment: CC: 49 y/o male with ESRD on HD who presented to the ED with chest pain and SOB. He has a hx PE (04/2014) and was on warfarin PTA although INR is SUBtherapeutic at 1.01 on admit. Suspect compliance is an issue. He is now post cath. Found with minimal CAD and likely nonischemic cardiopathy. Plan for medrx. Resuming coumadin and lovenox post cath.   Goal of Therapy:  Anti-Xa level 0.6-1 units/ml 4hrs after LMWH dose given Monitor platelets by anticoagulation protocol: Yes   Plan:   Lovenox 54m SQ daily 6hr post cath Coumadin 7.525mpo x 1 again today Daily INR  MiOnnie BoerPharmD Pager: [phone]/06/2015 3:23 PM

## 2014-09-02 NOTE — Progress Notes (Signed)
Echocardiogram 2D Echocardiogram has been performed.  Frank [name] M 09/02/2014, 10:07 AM

## 2014-09-02 NOTE — Progress Notes (Signed)
Patient Name: Frank [name] Date of Encounter: 09/02/2014  Principal Problem:   Chest pain with high risk for cardiac etiology Active Problems:   ESRD on dialysis- Tues, Thurs, Sat in [facility], nephro: [name]   Anemia of chronic kidney failure   Gout   Chronic pain   History of pulmonary embolism   Pleuritic chest pain   Malignant essential hypertension   Chronic anticoagulation   Prolonged Q-T interval on ECG   Elevated troponin   Subtherapeutic international normalized ratio (INR)   Abnormal nuclear stress test   Length of Stay: 3  SUBJECTIVE  Chest pain better. No dyspnea  CURRENT MEDS . sodium chloride  10 mL/hr Intravenous Once  . sodium chloride   Intravenous Once  . sodium chloride   Intravenous Once  . allopurinol  100 mg Oral Daily  . amLODipine  10 mg Oral Daily  . atorvastatin  40 mg Oral q1800  . carvedilol  12.5 mg Oral BID WC  . cinacalcet  30 mg Oral QPC supper  . cloNIDine  0.3 mg Oral TID  . darbepoetin (ARANESP) injection - DIALYSIS  200 mcg Intravenous Q Thu-HD  . docusate sodium  100 mg Oral BID  . enoxaparin (LOVENOX) injection  80 mg Subcutaneous Q24H  . hydrALAZINE  50 mg Oral TID  . isosorbide dinitrate  40 mg Oral 3 times per day  . lisinopril  20 mg Oral Daily  . minoxidil  2.5 mg Oral Daily  . NIFEdipine  30 mg Oral Daily  . pantoprazole  40 mg Oral Daily  . predniSONE  2.5 mg Oral BID WC  . regadenoson  0.4 mg Intravenous Once  . sevelamer carbonate  1,600 mg Oral TID WC  . sodium chloride  3 mL Intravenous Q12H  . sodium chloride  3 mL Intravenous Q12H  . tacrolimus  5 mg Oral BID    OBJECTIVE   Intake/Output Summary (Last 24 hours) at 09/02/14 0847 Last data filed at 09/01/14 2000  Gross per 24 hour  Intake    240 ml  Output   5000 ml  Net  -4760 ml   Filed Weights   09/01/14 0549 09/01/14 1119 09/01/14 1524  Weight: 83.28 kg (183 lb 9.6 oz) 81.3 kg (179 lb 3.7 oz) 76 kg (167 lb 8.8 oz)    PHYSICAL EXAM Filed  Vitals:   09/01/14 2000 09/01/14 2235 09/02/14 0000 09/02/14 0702  BP: 154/89 164/88 143/86 136/90  Pulse: 85   76  Temp: 98.5 F (36.9 C)  98.2 F (36.8 C) 98.6 F (37 C)  TempSrc: Oral  Oral Oral  Resp:   18 18  Height:      Weight:      SpO2: 98%  98% 95%   General: Alert, oriented x3, no distress Head: no evidence of trauma, PERRL, EOMI, no exophtalmos or lid lag, no myxedema, no xanthelasma; normal ears, nose and oropharynx Neck: normal jugular venous pulsations and no hepatojugular reflux; brisk carotid pulses without delay and no carotid bruits Chest: clear to auscultation, no signs of consolidation by percussion or palpation, normal fremitus, symmetrical and full respiratory excursions, right subclavian tunneled catheter Cardiovascular: normal position and quality of the apical impulse, regular rhythm, normal first and second heart sounds, no rubs or gallops, no murmur Abdomen: no tenderness or distention, no masses by palpation, no abnormal pulsatility or arterial bruits, normal bowel sounds, no hepatosplenomegaly Extremities: no clubbing, cyanosis or edema; 2+ radial, ulnar and brachial pulses bilaterally;  2+ right femoral, posterior tibial and dorsalis pedis pulses; 2+ left femoral, posterior tibial and dorsalis pedis pulses; no subclavian or femoral bruits Neurological: grossly nonfocal  LABS  CBC  Recent Labs  08/31/14 1405 09/01/14 1130  WBC 6.4 5.8  HGB 8.5* 8.5*  HCT 26.8* 26.7*  MCV 74.2* 75.6*  PLT 203 329   Basic Metabolic Panel  Recent Labs  09/01/14 1130 09/02/14 0358  NA 133* 133*  K 4.4 5.0  CL 98 100  CO2 24 25  GLUCOSE 80 91  BUN 45* 26*  CREATININE 11.37* 7.57*  CALCIUM 7.1* 7.6*  PHOS 4.1  --    Liver Function Tests  Recent Labs  09/01/14 1130  ALBUMIN 2.2*   No results for input(s): LIPASE, AMYLASE in the last 72 hours. Cardiac Enzymes  Recent Labs  08/30/14 1309 08/30/14 1930  TROPONINI 0.09* 0.07*  Radiology  Studies Imaging results have been reviewed and Nm Myocar Multi W/spect W/wall Motion / Ef  09/01/2014   CLINICAL DATA:  Chest pain  EXAM: MYOCARDIAL IMAGING WITH SPECT (REST AND PHARMACOLOGIC-STRESS)  GATED LEFT VENTRICULAR WALL MOTION STUDY  LEFT VENTRICULAR EJECTION FRACTION  TECHNIQUE: Standard myocardial SPECT imaging was performed after resting intravenous injection of 10 mCi Tc-93msestamibi. Subsequently, intravenous infusion of Lexiscan was performed under the supervision of the Cardiology staff. At peak effect of the drug, 30 mCi Tc-923mestamibi was injected intravenously and standard myocardial SPECT imaging was performed. Quantitative gated imaging was also performed to evaluate left ventricular wall motion, and estimate left ventricular ejection fraction.  COMPARISON:  None.  FINDINGS: Perfusion: Fixed defect in the inferior wall with associated peri-infarct ischemia and moderate reversible defect along the lateral wall. Additional small to moderate reversible defect along the anterior wall.  Wall Motion: Global hypokinesis with associated left ventricular dilatation.  Left Ventricular Ejection Fraction: 35 %  End diastolic volume 23518l  End systolic volume 15841l  IMPRESSION: 1. Prior inferior wall infarct with peri-infarct ischemia and moderate reversible defect along the lateral wall. Additional small to moderate reversible defect along the anterior wall.  2. Global hypokinesis with associated left ventricular dilatation.  3. Left ventricular ejection fraction 35%  4. High-risk stress test findings*.  *2012 Appropriate Use Criteria for Coronary Revascularization Focused Update: J Am Coll Cardiol. 206606;30(1):601-093http://content.[email or web address]?articleid=[email or web address]  These results will be called to the ordering clinician or representative by the Radiologist Assistant, and communication documented in the PACS or zVision Dashboard.   Electronically Signed   By: SrJulian Hy.D.   On:  09/01/2014 16:36    TELE NSR  ASSESSMENT AND PLAN  For coronary angio and possible PCI. He received enoxaparin around 8 PM. He insists on a radial cath ("he never wants an AV fistula anyway") so bleeding risk lower. This procedure has been fully reviewed with the patient and written informed consent has been obtained. I told him there is a chance he has multivessel CAD with surgical indication, which terrifies him. It is possible he only needs LCX PCI.    MiSanda KleinMD, FANoland [facility]cal[facility], LLCH[facility] [phone] 09/02/2014 8:47 AM

## 2014-09-02 NOTE — Progress Notes (Signed)
09/02/2014 3:47 PM  Flushed PD catheter with 1L 1.5% standard dialysate per MD order.  Converted patient from [place] to frensenius via extension; pt to remove extension and place a new cap to end of his line once he gets home.  1L in, 1L out. Fluid was yellow and clear.  Pt tolerated exchange well.   Princella Pellegrini

## 2014-09-02 NOTE — H&P (View-Only) (Signed)
Patient Name: Frank [name] Date of Encounter: 09/02/2014  Principal Problem:   Chest pain with high risk for cardiac etiology Active Problems:   ESRD on dialysis- Tues, Thurs, Sat in [facility], nephro: [name]   Anemia of chronic kidney failure   Gout   Chronic pain   History of pulmonary embolism   Pleuritic chest pain   Malignant essential hypertension   Chronic anticoagulation   Prolonged Q-T interval on ECG   Elevated troponin   Subtherapeutic international normalized ratio (INR)   Abnormal nuclear stress test   Length of Stay: 3  SUBJECTIVE  Chest pain better. No dyspnea  CURRENT MEDS . sodium chloride  10 mL/hr Intravenous Once  . sodium chloride   Intravenous Once  . sodium chloride   Intravenous Once  . allopurinol  100 mg Oral Daily  . amLODipine  10 mg Oral Daily  . atorvastatin  40 mg Oral q1800  . carvedilol  12.5 mg Oral BID WC  . cinacalcet  30 mg Oral QPC supper  . cloNIDine  0.3 mg Oral TID  . darbepoetin (ARANESP) injection - DIALYSIS  200 mcg Intravenous Q Thu-HD  . docusate sodium  100 mg Oral BID  . enoxaparin (LOVENOX) injection  80 mg Subcutaneous Q24H  . hydrALAZINE  50 mg Oral TID  . isosorbide dinitrate  40 mg Oral 3 times per day  . lisinopril  20 mg Oral Daily  . minoxidil  2.5 mg Oral Daily  . NIFEdipine  30 mg Oral Daily  . pantoprazole  40 mg Oral Daily  . predniSONE  2.5 mg Oral BID WC  . regadenoson  0.4 mg Intravenous Once  . sevelamer carbonate  1,600 mg Oral TID WC  . sodium chloride  3 mL Intravenous Q12H  . sodium chloride  3 mL Intravenous Q12H  . tacrolimus  5 mg Oral BID    OBJECTIVE   Intake/Output Summary (Last 24 hours) at 09/02/14 0847 Last data filed at 09/01/14 2000  Gross per 24 hour  Intake    240 ml  Output   5000 ml  Net  -4760 ml   Filed Weights   09/01/14 0549 09/01/14 1119 09/01/14 1524  Weight: 83.28 kg (183 lb 9.6 oz) 81.3 kg (179 lb 3.7 oz) 76 kg (167 lb 8.8 oz)    PHYSICAL EXAM Filed  Vitals:   09/01/14 2000 09/01/14 2235 09/02/14 0000 09/02/14 0702  BP: 154/89 164/88 143/86 136/90  Pulse: 85   76  Temp: 98.5 F (36.9 C)  98.2 F (36.8 C) 98.6 F (37 C)  TempSrc: Oral  Oral Oral  Resp:   18 18  Height:      Weight:      SpO2: 98%  98% 95%   General: Alert, oriented x3, no distress Head: no evidence of trauma, PERRL, EOMI, no exophtalmos or lid lag, no myxedema, no xanthelasma; normal ears, nose and oropharynx Neck: normal jugular venous pulsations and no hepatojugular reflux; brisk carotid pulses without delay and no carotid bruits Chest: clear to auscultation, no signs of consolidation by percussion or palpation, normal fremitus, symmetrical and full respiratory excursions, right subclavian tunneled catheter Cardiovascular: normal position and quality of the apical impulse, regular rhythm, normal first and second heart sounds, no rubs or gallops, no murmur Abdomen: no tenderness or distention, no masses by palpation, no abnormal pulsatility or arterial bruits, normal bowel sounds, no hepatosplenomegaly Extremities: no clubbing, cyanosis or edema; 2+ radial, ulnar and brachial pulses bilaterally;  2+ right femoral, posterior tibial and dorsalis pedis pulses; 2+ left femoral, posterior tibial and dorsalis pedis pulses; no subclavian or femoral bruits Neurological: grossly nonfocal  LABS  CBC  Recent Labs  08/31/14 1405 09/01/14 1130  WBC 6.4 5.8  HGB 8.5* 8.5*  HCT 26.8* 26.7*  MCV 74.2* 75.6*  PLT 203 161   Basic Metabolic Panel  Recent Labs  09/01/14 1130 09/02/14 0358  NA 133* 133*  K 4.4 5.0  CL 98 100  CO2 24 25  GLUCOSE 80 91  BUN 45* 26*  CREATININE 11.37* 7.57*  CALCIUM 7.1* 7.6*  PHOS 4.1  --    Liver Function Tests  Recent Labs  09/01/14 1130  ALBUMIN 2.2*   No results for input(s): LIPASE, AMYLASE in the last 72 hours. Cardiac Enzymes  Recent Labs  08/30/14 1309 08/30/14 1930  TROPONINI 0.09* 0.07*  Radiology  Studies Imaging results have been reviewed and Nm Myocar Multi W/spect W/wall Motion / Ef  09/01/2014   CLINICAL DATA:  Chest pain  EXAM: MYOCARDIAL IMAGING WITH SPECT (REST AND PHARMACOLOGIC-STRESS)  GATED LEFT VENTRICULAR WALL MOTION STUDY  LEFT VENTRICULAR EJECTION FRACTION  TECHNIQUE: Standard myocardial SPECT imaging was performed after resting intravenous injection of 10 mCi Tc-4msestamibi. Subsequently, intravenous infusion of Lexiscan was performed under the supervision of the Cardiology staff. At peak effect of the drug, 30 mCi Tc-93mestamibi was injected intravenously and standard myocardial SPECT imaging was performed. Quantitative gated imaging was also performed to evaluate left ventricular wall motion, and estimate left ventricular ejection fraction.  COMPARISON:  None.  FINDINGS: Perfusion: Fixed defect in the inferior wall with associated peri-infarct ischemia and moderate reversible defect along the lateral wall. Additional small to moderate reversible defect along the anterior wall.  Wall Motion: Global hypokinesis with associated left ventricular dilatation.  Left Ventricular Ejection Fraction: 35 %  End diastolic volume 23096l  End systolic volume 15045l  IMPRESSION: 1. Prior inferior wall infarct with peri-infarct ischemia and moderate reversible defect along the lateral wall. Additional small to moderate reversible defect along the anterior wall.  2. Global hypokinesis with associated left ventricular dilatation.  3. Left ventricular ejection fraction 35%  4. High-risk stress test findings*.  *2012 Appropriate Use Criteria for Coronary Revascularization Focused Update: J Am Coll Cardiol. 204098;11(9):147-829http://content.onairportbarriers.comspx?articleid=[email or web address]  These results will be called to the ordering clinician or representative by the Radiologist Assistant, and communication documented in the PACS or zVision Dashboard.   Electronically Signed   By: SrJulian Hy.D.   On:  09/01/2014 16:36    TELE NSR  ASSESSMENT AND PLAN  For coronary angio and possible PCI. He received enoxaparin around 8 PM. He insists on a radial cath ("he never wants an AV fistula anyway") so bleeding risk lower. This procedure has been fully reviewed with the patient and written informed consent has been obtained. I told him there is a chance he has multivessel CAD with surgical indication, which terrifies him. It is possible he only needs LCX PCI.    MiSanda KleinMD, FASwedishamerican Medical Center [facility]re[facility] ([phone] ([phone] 09/02/2014 8:47 AM

## 2014-09-02 NOTE — CV Procedure (Signed)
CARDIAC CATHETERIZATION REPORT  NAME:  Frank [name]   MRN: [id number] DOB:  [birth year]   ADMIT DATE: 08/30/2014 Procedure Date: 09/02/2014  INTERVENTIONAL CARDIOLOGIST: [name], M.D., MS PRIMARY CARE PROVIDER: [name], NP PRIMARY CARDIOLOGIST: Dr. Marlou Porch, or Dr. Sallyanne Kuster  PATIENT:  Frank [name] is a 49 y.o. male end-stage renal disease, status post renal transplant with a history of PE who presented with shortness of breath and atypical chest pain. A Myoview performed was read as high risk with inferolateral ischemia suggested. He is now referred for invasive evaluation.  PRE-OPERATIVE DIAGNOSIS:    Abnormal Nuclear Stress Test  Chest Pain concerning for Possible Angina  PROCEDURES PERFORMED:    Left Heart Catheterization with Native Coronary Angiography  via Left Radial Artery   PROCEDURE: The patient was brought to the [facility]nd [facility] in the fasting state and prepped and draped in the usual sterile fashion for Right Radial  artery access. A modified Allen's test was performed on the Right wrist demonstrating excellent collateral flow for radial access.   Sterile technique was used including antiseptics, cap, gloves, gown, hand hygiene, mask and sheet. Skin prep: Chlorhexidine.   Consent: Risks of procedure as well as the alternatives and risks of each were explained to the (patient/caregiver). Consent for procedure obtained.   Time Out: Verified patient identification, verified procedure, site/side was marked, verified correct patient position, special equipment/implants available, medications/allergies/relevent history reviewed, required imaging and test results available. Performed.  Access:   Right Radial Artery: 6 Fr Sheath -  Seldinger Technique (Angiocath Micropuncture Kit)  Radial Cocktail - 10 mL; IV Heparin 5000 Units   Left Heart Catheterization:  5-6Fr Catheters advanced or exchanged over a long exchange safety J-wire under  fluoroscopic guidance; TIG 4.0 catheter advanced first.  Left Coronary Artery Cineangiography: Initially 5 French TIG 4.0 Catheter was used, but due to the size of the vessels inadequate perfusion was noted. Therefore we switched a 6 French catheters. A 6 [place] JL4 catheter was used.  Right Coronary Artery Cineangiography: 6 French JR4 Catheter   LV Hemodynamics: TIG 4.0  Sheath removed in the catheter lab with TR band placement for hemostasis. TR Band: 1440  Hours; 13 mL air  FINDINGS:  Hemodynamics:   Central Aortic Pressure / Mean: 129/89/ mmHg  Left Ventricular Pressure / LVEDP: 124/8/18 mmHg  Left Ventriculography: Deferred  Coronary Anatomy:  Dominance: Right  Left Main: Very large caliber vessel that takes a downward turn for bifurcating into the LAD and Circumflex. Angiographic normal. LAD: Are just caliber vessel that gives rise to a moderate caliber D1 and moderate to large caliber bifurcating D2 area did LAD itself reaches down around the apex perfusing the distal third of the inferoapex. Only mild luminal irregular is noted.  Left Circumflex: Large-caliber, nondominant vessel that bifurcates in the AV groove into a moderate large-caliber AV groove branch that terminates as several small posterolateral branches. The main branch courses is a lateral OM that again trifurcates into 3 small branches distally. There is a tubular eccentric 30-40% stenosis in the distal portion of the main OM branch, but no obstructive disease noted.   RCA: Large-caliber codominant vessel that has minimal luminal irregularities are courses distally before bifurcating into the Right Posterior Descending Artery (RPDA) and the Right Posterior AV Groove Branch (RPAV).   RPDA: Moderate large-caliber somewhat tortuous vessel it reaches almost all with the apex. Minimal luminal irregularities.  RPL Sysytem: Actually consists of 2 branches that  come off of the PDA as separate branches is better from a  single trunk. They are small moderate in caliber and free of significant disease. Distal small branches.    MEDICATIONS:  Anesthesia:  Local Lidocaine 2 ml  Sedation:  2 mg IV Versed, 50 mcg IV fentanyl ;   Omnipaque Contrast: 100 ml  Anticoagulation:  IV Heparin 5000 Units  Radial Cocktail: 5 mg Verapamil, 400 mcg NTG, 2 ml 2% Lidocaine in 10 ml NS  PATIENT DISPOSITION:    The patient was transferred to the PACU holding area in a hemodynamicaly stable, chest pain free condition.  The patient tolerated the procedure well, and there were no complications.  EBL:   < 5 ml  The patient was stable before, during, and after the procedure.  POST-OPERATIVE DIAGNOSIS:    Angiographically minimal CAD, but tortuous. No lesions that would correlate with abnormal Myoview  Moderate to severe global hypokinesis by Myoview stress test less noted on echocardiogram; this would suggest likely nonischemic cardiopathy  PLAN OF CARE:  Return to nursing unit for post radial cath care.  Defer medical management of cardiac myopathy to the cardiology consultation service.  The results were forwarded to Dr. Sallyanne Kuster.  [name], M.D., M.S. Interventional Cardiologist   Pager # [phone]

## 2014-09-02 NOTE — Progress Notes (Signed)
Admit: 08/30/2014 LOS: 3  Frank [name] ESRD [facility] admit with pleuritic CP after recent PE, symptoatmic anemia, volume overload  Subjective:  HD yesterday: 5L UF, post weight 76kg Nuc ST with high risk lesion, for [facility]  02/11 0701 - 02/12 0700 In: 240 [P.O.:240] Out: 5000   Filed Weights   09/01/14 0549 09/01/14 1119 09/01/14 1524  Weight: 83.28 kg (183 lb 9.6 oz) 81.3 kg (179 lb 3.7 oz) 76 kg (167 lb 8.8 oz)    Scheduled Meds: . sodium chloride  10 mL/hr Intravenous Once  . sodium chloride   Intravenous Once  . sodium chloride   Intravenous Once  . [MAR Hold] allopurinol  100 mg Oral Daily  . [MAR Hold] amLODipine  10 mg Oral Daily  . [MAR Hold] atorvastatin  40 mg Oral q1800  . [MAR Hold] carvedilol  12.5 mg Oral BID WC  . [MAR Hold] cinacalcet  30 mg Oral QPC supper  . [MAR Hold] cloNIDine  0.3 mg Oral TID  . [MAR Hold] darbepoetin (ARANESP) injection - DIALYSIS  200 mcg Intravenous Q Thu-HD  . [MAR Hold] docusate sodium  100 mg Oral BID  . [MAR Hold] enoxaparin (LOVENOX) injection  80 mg Subcutaneous Q24H  . [MAR Hold] hydrALAZINE  50 mg Oral TID  . [MAR Hold] isosorbide dinitrate  40 mg Oral 3 times per day  . [MAR Hold] lisinopril  20 mg Oral Daily  . [MAR Hold] minoxidil  2.5 mg Oral Daily  . [MAR Hold] NIFEdipine  30 mg Oral Daily  . [MAR Hold] pantoprazole  40 mg Oral Daily  . [MAR Hold] predniSONE  2.5 mg Oral BID WC  . regadenoson  0.4 mg Intravenous Once  . [MAR Hold] sevelamer carbonate  1,600 mg Oral TID WC  . [MAR Hold] sodium chloride  3 mL Intravenous Q12H  . sodium chloride  3 mL Intravenous Q12H  . [MAR Hold] tacrolimus  5 mg Oral BID   Continuous Infusions: . sodium chloride     PRN Meds:.sodium chloride, [MAR Hold] acetaminophen, [MAR Hold] albuterol, [MAR Hold] cyclobenzaprine, [MAR Hold] diazepam, [MAR Hold]  morphine injection, [MAR Hold] nitroGLYCERIN, [MAR Hold] ondansetron **OR** [MAR Hold] ondansetron (ZOFRAN) IV, [MAR Hold] oxyCODONE, [MAR  Hold] polyethylene glycol, sodium chloride, [MAR Hold] traMADol  Current Labs: reviewed    Physical Exam:  Blood pressure 136/90, pulse 76, temperature 98.6 F (37 C), temperature source Oral, resp. rate 18, height 6' 1.5" (1.867 m), weight 76 kg (167 lb 8.8 oz), SpO2 95 %. GEN: NAD ENT: NCAT EYES: EOMI CV: RRR, no rub PULM: CTAB ABD: s/nt/nd SKIN: No rashes/lesions; R IJ TDC bandaged EXT:No LEE   Unit: [facility] Days: THS Time: 4h Dialyzer: F200  EDW: 158lb K/Ca: 2K / 2.5Ca Access: TDC Needle Size: na BFR/DFR: 350/700 UF Proflie: ? VDRA: Hectorol 62mg qTx BINDER: Renvela 1600 qAC EPO: aranesp 200 q? IV Fe: ? Heparin: 3400 units bolus, then 500/hr Most Recent Phos / PTH: ? Most Recent TSAT / Ferritin: ? Most Recent eKT/V: ? Treatment Adherence: ?  A/P 1. [facility]: [facility]. Keep on [facility] schedule 2. Cont aggressive UF for volume overload 3. Using TDC 4. Outpt plan to transition to PD, has PD catheter 2. HTN/Vol: As above, cont home BP meds for now; bp controlled 3. Anemia:  1. S/p 2u PRBC 2. No overt GI losses; follow closely 3. Has Ferlicit allergy in chart 4. Cont ESA -- Aranesp 200 2/11 4. MBD:  Cont binder and cinacalcet 5. Presence of PD catheter 1. Will arrange to be flushed 6. Pleuritic CP: probably related to recent PE; improved.   1. High risk Nuc ST 2. LHC today 7. SOB: partiallyr elatetd to #3; cardioogy following, cont UF 8. Chronic anticoagulation: per [facility], on LMWH  Pearson Grippe MD 09/02/2014, 1:30 PM   Recent Labs Lab 08/31/14 0305 09/01/14 1130 09/02/14 0358  NA 134* 133* 133*  K 4.6 4.4 5.0  CL 101 98 100  CO2 _0 GLUCOSE 80 80 91  BUN 30* 45* 26*  CREATININE 8.20* 11.37* 7.57*  CALCIUM 7.3* 7.1* 7.6*  PHOS  --  4.1  --     Recent Labs Lab 08/31/14 0305 08/31/14 1405 09/01/14 1130  WBC 7.8 6.4 5.8  HGB 8.7* 8.5* 8.5*  HCT 26.7* 26.8* 26.7*  MCV 73.8* 74.2* 75.6*  PLT 213 203 196

## 2014-09-02 NOTE — Progress Notes (Signed)
Patient is to be discharged home on lovenox; patient has Medicare and Medicaid with prescription drug coverage and does not have any problem getting his medication filled/ [facility] in [place] RN,BSN,MHA [phone]

## 2014-09-02 NOTE — Telephone Encounter (Signed)
Patient rescheduled his health coach appointment for 09/08/2014 1200

## 2014-09-02 NOTE — Interval H&P Note (Signed)
History and Physical Interval Note:  09/02/2014 1:56 PM  Frank [name]  has presented today for surgery, with the diagnosis of CP with moderate CAD Risk - Abnormal Nuclear Stress. Principal Problem:   Chest pain with high risk for cardiac etiology Active Problems:   Malignant essential hypertension   Elevated troponin   Abnormal nuclear stress test   ESRD on dialysis- Tues, Thurs, Sat in [facility], nephro: [name]   Anemia of chronic kidney failure   History of pulmonary embolism   Subtherapeutic international normalized ratio (INR)   Gout   Chronic pain   Pleuritic chest pain   Chronic anticoagulation   Prolonged Q-T interval on ECG  The various methods of treatment have been discussed with the patient and family. After consideration of risks, benefits and other options for treatment, the patient has consented to  Procedure(s): LEFT HEART CATHETERIZATION WITH CORONARY ANGIOGRAM (N/A) +/- as a surgical intervention .  The patient's history has been reviewed, patient examined, no change in status, stable for surgery.  I have reviewed the patient's chart and labs.  Questions were answered to the patient's satisfaction.    Cath Lab Visit (complete for each Cath Lab visit)  Clinical Evaluation Leading to the Procedure:   ACS: No.  Non-ACS:    Anginal Classification: CCS II  Anti-ischemic medical therapy: Maximal Therapy (2 or more classes of medications)  Non-Invasive Test Results: High-risk stress test findings: cardiac mortality >3%/year  Prior CABG: No previous CABG   Frank [name]

## 2014-09-12 ENCOUNTER — Emergency Department (HOSPITAL_COMMUNITY): Payer: Medicare Other

## 2014-09-12 ENCOUNTER — Emergency Department (HOSPITAL_COMMUNITY)
Admission: EM | Admit: 2014-09-12 | Discharge: 2014-09-12 | Disposition: A | Discharge status: Home / Self Care | Payer: Medicare Other | Attending: Emergency Medicine | Admitting: Emergency Medicine

## 2014-09-12 ENCOUNTER — Encounter (HOSPITAL_COMMUNITY): Payer: Self-pay | Admitting: Emergency Medicine

## 2014-09-12 DIAGNOSIS — Z87891 Personal history of nicotine dependence: Secondary | ICD-10-CM | POA: Diagnosis not present

## 2014-09-12 DIAGNOSIS — D649 Anemia, unspecified: Secondary | ICD-10-CM | POA: Diagnosis not present

## 2014-09-12 DIAGNOSIS — Z992 Dependence on renal dialysis: Secondary | ICD-10-CM | POA: Insufficient documentation

## 2014-09-12 DIAGNOSIS — Z7952 Long term (current) use of systemic steroids: Secondary | ICD-10-CM | POA: Insufficient documentation

## 2014-09-12 DIAGNOSIS — F419 Anxiety disorder, unspecified: Secondary | ICD-10-CM | POA: Diagnosis not present

## 2014-09-12 DIAGNOSIS — Z7901 Long term (current) use of anticoagulants: Secondary | ICD-10-CM | POA: Insufficient documentation

## 2014-09-12 DIAGNOSIS — I10 Essential (primary) hypertension: Secondary | ICD-10-CM

## 2014-09-12 DIAGNOSIS — N186 End stage renal disease: Secondary | ICD-10-CM | POA: Diagnosis not present

## 2014-09-12 DIAGNOSIS — Z9889 Other specified postprocedural states: Secondary | ICD-10-CM | POA: Insufficient documentation

## 2014-09-12 DIAGNOSIS — N189 Chronic kidney disease, unspecified: Secondary | ICD-10-CM

## 2014-09-12 DIAGNOSIS — F329 Major depressive disorder, single episode, unspecified: Secondary | ICD-10-CM | POA: Insufficient documentation

## 2014-09-12 DIAGNOSIS — Z79899 Other long term (current) drug therapy: Secondary | ICD-10-CM | POA: Insufficient documentation

## 2014-09-12 DIAGNOSIS — M25511 Pain in right shoulder: Secondary | ICD-10-CM

## 2014-09-12 DIAGNOSIS — I120 Hypertensive chronic kidney disease with stage 5 chronic kidney disease or end stage renal disease: Secondary | ICD-10-CM | POA: Diagnosis not present

## 2014-09-12 DIAGNOSIS — M542 Cervicalgia: Secondary | ICD-10-CM

## 2014-09-12 DIAGNOSIS — E119 Type 2 diabetes mellitus without complications: Secondary | ICD-10-CM | POA: Insufficient documentation

## 2014-09-12 DIAGNOSIS — R079 Chest pain, unspecified: Secondary | ICD-10-CM | POA: Diagnosis present

## 2014-09-12 LAB — BASIC METABOLIC PANEL
ANION GAP: 9 (ref 5–15)
BUN: 32 mg/dL — AB (ref 6–23)
CALCIUM: 7.5 mg/dL — AB (ref 8.4–10.5)
CHLORIDE: 103 mmol/L (ref 96–112)
CO2: 24 mmol/L (ref 19–32)
Creatinine, Ser: 9.82 mg/dL — ABNORMAL HIGH (ref 0.50–1.35)
GFR calc non Af Amer: 5 mL/min — ABNORMAL LOW (ref >90)
GFR, EST AFRICAN AMERICAN: 6 mL/min — AB (ref >90)
GLUCOSE: 116 mg/dL — AB (ref 70–99)
POTASSIUM: 3.7 mmol/L (ref 3.5–5.1)
Sodium: 136 mmol/L (ref 135–145)

## 2014-09-12 LAB — PROTIME-INR
INR: 1.2 (ref 0.00–1.49)
Prothrombin Time: 15.3 seconds — ABNORMAL HIGH (ref 11.6–15.2)

## 2014-09-12 LAB — BRAIN NATRIURETIC PEPTIDE

## 2014-09-12 LAB — CBC
HCT: 28.6 % — ABNORMAL LOW (ref 39.0–52.0)
HEMOGLOBIN: 9 g/dL — AB (ref 13.0–17.0)
MCH: 24.3 pg — ABNORMAL LOW (ref 26.0–34.0)
MCHC: 31.5 g/dL (ref 30.0–36.0)
MCV: 77.3 fL — ABNORMAL LOW (ref 78.0–100.0)
Platelets: 168 10*3/uL (ref 150–400)
RBC: 3.7 MIL/uL — AB (ref 4.22–5.81)
RDW: 21.5 % — ABNORMAL HIGH (ref 11.5–15.5)
WBC: 6.6 10*3/uL (ref 4.0–10.5)

## 2014-09-12 LAB — I-STAT TROPONIN, ED: TROPONIN I, POC: 0.05 ng/mL (ref 0.00–0.08)

## 2014-09-12 MED ORDER — OXYCODONE-ACETAMINOPHEN 5-325 MG PO TABS
2.0000 | ORAL_TABLET | Freq: Once | ORAL | Status: AC
  Administered 2014-09-12: 2 via ORAL
  Filled 2014-09-12: qty 2

## 2014-09-12 NOTE — Discharge Instructions (Signed)
PLEASE TAKE YOUR COUMADIN AS WE DISCUSSED - PLEASE CALL YOUR PRIMARY DOCTOR FOR FURTHER GUIDANCE   SEEK IMMEDIATE MEDICAL ATTENTION IF: You have severe chest pain, especially if the pain is crushing or pressure-like and spreads to the arms, back, neck, or jaw, or if you have sweating, nausea (feeling sick to your stomach), or shortness of breath. THIS IS AN EMERGENCY. Don't wait to see if the pain will go away. Get medical help at once. Call 911 or 0 (operator). DO NOT drive yourself to the hospital.  Your chest pain gets worse and does not go away with rest.  You have an attack of chest pain lasting longer than usual, despite rest and treatment with the medications your caregiver has prescribed.  You wake from sleep with chest pain or shortness of breath.  You feel dizzy or faint.  You have chest pain not typical of your usual pain for which you originally saw your caregiver.

## 2014-09-12 NOTE — ED Notes (Signed)
Pt adamantly refused to be shaved for EKG by multiple staff members, informed that we need to shave in order to get an EKG and r/o cardiac causes for pain. Pt continues to refuse shaving.

## 2014-09-12 NOTE — ED Notes (Signed)
Pt arrives with chest pain radiating to right shoulder ongoing since Friday. Throbbing pain, denies SHOB, N/V, no diaphoresis. Speaking in full sentences, ambulatory at triage.

## 2014-09-12 NOTE — ED Notes (Signed)
Patient transported to X-ray 

## 2014-09-12 NOTE — ED Notes (Signed)
MD at bedside.

## 2014-09-12 NOTE — ED Provider Notes (Signed)
CSN: [id number]     Arrival date & time 09/12/14  0228 History  This chart was scribed for Sharyon Cable, MD by Chester Holstein, ED Scribe. This patient was seen in room A04C/A04C and the patient's care was started at 3:01 AM.      Chief Complaint  Patient presents with  . Chest Pain    Patient is a 49 y.o. male presenting with chest pain and shoulder pain. The history is provided by the patient. No language interpreter was used.  Chest Pain Associated symptoms: no abdominal pain, no back pain, no diaphoresis, no dizziness, no fever, no nausea, no numbness, no shortness of breath, not vomiting and no weakness   Shoulder Pain Location:  Shoulder Time since incident:  2 weeks Injury: no   Shoulder location:  R shoulder Pain details:    Radiates to:  Chest (neck)   Severity:  Severe   Duration:  2 weeks   Timing:  Constant   Progression:  Worsening Chronicity:  New Dislocation: no   Prior injury to area:  No Associated symptoms: decreased range of motion and neck pain   Associated symptoms: no back pain, no fever, no muscle weakness, no numbness and no tingling    HPI Comments: Frank [name] is a 49 y.o. male with PMHx of HTN, DM, SOB, and ESRD who presents to the Emergency Department complaining of right shoulder pain with first onset 2 weeks ago worsening 4 days ago. Pt notes pain radiates to his chest and neck. Pt last dialyzed yesterday. Pt is transitioning from hemodialysis to peritoneal dialysis. Pt notes movement aggravates the pain but denies pain with deep inspiration. Pt was seen in ED on 08/30/14 for intermittent L sided chest pain and SOB and admitted. Pt was discharged on 09/02/14. Pt denies any known injury, diaphoresis, fever, nausea, vomiting, SOB, numbness, tingling, weakness, dizziness, abdominal pain, and back pain.  Past Medical History  Diagnosis Date  . Hypertension   . Depression   . Complication of anesthesia     itching, sore throat  . Diabetes mellitus  without complication     No history per patient, but remains under history as A1c would not be accurate given on dialysis  . Shortness of breath   . Anxiety   . ESRD (end stage renal disease)     due to HTN per patient, followed at [facility], s/p failed kidney transplant - dialysis Tue, Th, Sat   Past Surgical History  Procedure Laterality Date  . Kidney receipient  2006    failed and started HD in March 2014  . Capd insertion    . Capd removal    . Left heart catheterization with coronary angiogram N/A 09/02/2014    Procedure: LEFT HEART CATHETERIZATION WITH CORONARY ANGIOGRAM;  Surgeon: [name], MD;  Location: [facility] CATH LAB;  Service: Cardiovascular;  Laterality: N/A;   No family history on file. History  Substance Use Topics  . Smoking status: Former Smoker -- 1.00 packs/day for 1 years    Types: Cigarettes  . Smokeless tobacco: Never Used     Comment: quit Jan 2014  . Alcohol Use: No    Review of Systems  Constitutional: Negative for fever and diaphoresis.  Respiratory: Negative for shortness of breath.   Cardiovascular: Positive for chest pain.  Gastrointestinal: Negative for nausea, vomiting and abdominal pain.  Musculoskeletal: Positive for myalgias, arthralgias and neck pain. Negative for back pain.  Neurological: Negative for dizziness, weakness and numbness.  All  other systems reviewed and are negative.     Allergies  Ferrlecit and Darvocet  Home Medications   Prior to Admission medications   Medication Sig Start Date End Date Taking? Authorizing Provider  acetaminophen (TYLENOL) 325 MG tablet Take 2 tablets (650 mg total) by mouth every 4 (four) hours as needed for mild pain, fever or headache. 04/06/14   Delfina Redwood, MD  allopurinol (ZYLOPRIM) 100 MG tablet Take 100 mg by mouth daily.    Historical Provider, MD  amLODipine (NORVASC) 5 MG tablet Take 10 mg by mouth daily.    Historical Provider, MD  atorvastatin (LIPITOR) 40 MG tablet Take 1 tablet  (40 mg total) by mouth daily at 6 PM. 09/02/14   Barton Dubois, MD  carvedilol (COREG) 12.5 MG tablet Take 1 tablet (12.5 mg total) by mouth 2 (two) times daily with a meal. 09/02/14   Barton Dubois, MD  cinacalcet (SENSIPAR) 30 MG tablet Take 30 mg by mouth daily.    Historical Provider, MD  cloNIDine (CATAPRES) 0.3 MG tablet Take 1 tablet (0.3 mg total) by mouth 3 (three) times daily. 04/06/14   Delfina Redwood, MD  cyclobenzaprine (FLEXERIL) 10 MG tablet Take 1 tablet (10 mg total) by mouth 3 (three) times daily as needed. 05/05/14   Janice Norrie, MD  diazepam (VALIUM) 5 MG tablet Take 1 tablet (5 mg total) by mouth 2 (two) times daily. 01/25/14   [name], PA-C  enoxaparin (LOVENOX) 80 MG/0.8ML injection Inject 0.8 mLs (80 mg total) into the skin daily. 09/02/14   Barton Dubois, MD  hydrALAZINE (APRESOLINE) 25 MG tablet Take 50 mg by mouth 3 (three) times daily.    Historical Provider, MD  isosorbide dinitrate (ISORDIL) 40 MG tablet Take 40 mg by mouth every 8 (eight) hours.    Historical Provider, MD  minoxidil (LONITEN) 2.5 MG tablet Take 1 tablet (2.5 mg total) by mouth daily. 04/06/14   Delfina Redwood, MD  NIFEdipine (PROCARDIA-XL/ADALAT CC) 30 MG 24 hr tablet Take 1 tablet (30 mg total) by mouth daily. 04/06/14   Delfina Redwood, MD  omeprazole (PRILOSEC) 20 MG capsule Take 20 mg by mouth daily. 07/01/13   Historical Provider, MD  oxyCODONE (OXY IR/ROXICODONE) 5 MG immediate release tablet Take 1-2 tablets (5-10 mg total) by mouth every 6 (six) hours as needed for severe pain. 09/02/14   Barton Dubois, MD  polyethylene glycol [facility]) packet Take 17 g by mouth daily. For constipation 05/12/14   Domenic Polite, MD  predniSONE (DELTASONE) 2.5 MG tablet Take 2.5 mg by mouth 2 (two) times daily with a meal.    Historical Provider, MD  sevelamer carbonate (RENVELA) 800 MG tablet Take 1,600 mg by mouth 3 (three) times daily with meals. 12/27/13 12/27/14  Historical Provider, MD  tacrolimus  (PROGRAF) 1 MG capsule Take 5 mg by mouth 2 (two) times daily.    Historical Provider, MD  traMADol (ULTRAM) 50 MG tablet Take 2 tablets (100 mg total) by mouth every 6 (six) hours as needed. 05/05/14   Janice Norrie, MD  warfarin (COUMADIN) 5 MG tablet Take 1.5 tablets (7.5 mg total) by mouth one time only at 6 PM. Take 7m daily at 6pm 09/02/14   CBarton Dubois MD   BP 173/107 mmHg  Pulse 81  Temp(Src) 98.8 F (37.1 C) (Oral)  Resp 16  Ht 6' 1.5" (1.867 m)  Wt 168 lb (76.204 kg)  BMI 21.86 kg/m2  SpO2 98% Physical Exam  Nursing  note and vitals reviewed.   CONSTITUTIONAL: Well developed/well nourished HEAD: Normocephalic/atraumatic EYES: EOMI/PERRL ENMT: Mucous membranes moist NECK: supple no meningeal signs SPINE/BACK:entire spine nontender; right cervical paraspinal tenderness CV: S1/S2 noted, no murmurs/rubs/gallops noted LUNGS: Lungs are clear to auscultation bilaterally, no apparent distress ABDOMEN: soft, nontender, no rebound or guarding, bowel sounds noted throughout abdomen, PD catheter in place without tenderness GU:no cva tenderness NEURO: Pt is awake/alert/appropriate, moves all extremitiesx4.  No facial droop. Equal power noted in bilateral UE  EXTREMITIES: pulses normal/equal, full ROM, TTP of right shoulder, no deformity noted, full ROM noted, equal pulses x4; no UE edema noted SKIN: warm, color normal,  Dialysis catheter noted to right chest no tenderness noted PSYCH: no abnormalities of mood noted, alert and oriented to situation  ED Course  Procedures  DIAGNOSTIC STUDIES: Oxygen Saturation is 98% on room air, normal by my interpretation.    COORDINATION OF CARE: 3:10 AM Discussed treatment plan with patient at beside, the patient agrees with the plan and has no further questions at this time.  4:06 AM Pt with cardiac cath on 09/02/14 - minimal CAD per cardiologist.  CT chest on 08/30/14 negative for acute PE (though supposed to be on chronic coumadin) EKG is  unchanged from prior His CXR is unchanged  -no increased cardiomegaly to suggest pericardial effusion Low suspicion for acute aortic dissection (current pain located in right shoulder) Most of his pain is right shoulder/right neck (no focal weakness, no edema to suggest DVT in arm) Advised his coumadin level is subtherapeutic - he reports he has stopped lovenox, however he is unsure if he has missed coumadin.  Advised to take meds as prescribed and call his PCP later today Advised f/u with PCP or orthopedics for his right shoulder pain, but no acute injury noted on xray BP 170/102 mmHg  Pulse 84  Temp(Src) 98.8 F (37.1 C) (Oral)  Resp 15  Ht 6' 1.5" (1.867 m)  Wt 168 lb (76.204 kg)  BMI 21.86 kg/m2  SpO2 99%    Labs Review Labs Reviewed  CBC - Abnormal; Notable for the following:    RBC 3.70 (*)    Hemoglobin 9.0 (*)    HCT 28.6 (*)    MCV 77.3 (*)    MCH 24.3 (*)    RDW 21.5 (*)    All other components within normal limits  BASIC METABOLIC PANEL - Abnormal; Notable for the following:    Glucose, Bld 116 (*)    BUN 32 (*)    Creatinine, Ser 9.82 (*)    Calcium 7.5 (*)    GFR calc non Af Amer 5 (*)    GFR calc Af Amer 6 (*)    All other components within normal limits  BRAIN NATRIURETIC PEPTIDE - Abnormal; Notable for the following:    B Natriuretic Peptide >4500.0 (*)    All other components within normal limits  PROTIME-INR - Abnormal; Notable for the following:    Prothrombin Time 15.3 (*)    All other components within normal limits  I-STAT TROPOININ, ED    Imaging Review Dg Chest 2 View  09/12/2014   CLINICAL DATA:  Chest pain.  EXAM: CHEST  2 VIEW  COMPARISON:  Radiographs and CT 08/30/2014  FINDINGS: Right-sided dialysis catheter remains in place. Cardiomegaly is stable. No pulmonary edema. There is mild peribronchial cuffing. No consolidation to suggest pneumonia. No pleural effusion or pneumothorax. No acute osseous abnormalities.  IMPRESSION: Mild peribronchial  cuffing, may reflect bronchitis. Stable cardiomegaly.  Electronically Signed   By: Jeb Levering M.D.   On: 09/12/2014 03:58     EKG Interpretation   Date/Time:  Monday September 12 2014 02:38:06 EST Ventricular Rate:  82 PR Interval:  162 QRS Duration: 89 QT Interval:  430 QTC Calculation: 502 R Axis:   -15 Text Interpretation:  Sinus rhythm Probable left atrial enlargement RSR'  in V1 or V2, probably normal variant LVH with secondary repolarization  abnormality Prolonged QT interval Baseline wander in lead(s) V3 V5  artifact noted Confirmed by [place] ([id number]) on 09/12/2014  2:42:36 AM     Medications  oxyCODONE-acetaminophen (PERCOCET/ROXICET) 5-325 MG per tablet 2 tablet (not administered)    MDM   Final diagnoses:  Right shoulder pain  Neck pain  Essential hypertension  Chronic renal failure, unspecified stage  Anemia, unspecified anemia type    Nursing notes including past medical history and social history reviewed and considered in documentation xrays/imaging reviewed by myself and considered during evaluation Labs/vital reviewed myself and considered during evaluation Previous records reviewed and considered Narcotic database reviewed and considered in decision making   I personally performed the services described in this documentation, which was scribed in my presence. The recorded information has been reviewed and is accurate.       Sharyon Cable, MD 09/12/14 [phone]

## 2014-09-14 ENCOUNTER — Emergency Department (HOSPITAL_COMMUNITY)
Admission: EM | Admit: 2014-09-14 | Discharge: 2014-09-14 | Disposition: A | Discharge status: Home / Self Care | Payer: Medicare Other | Attending: Emergency Medicine | Admitting: Emergency Medicine

## 2014-09-14 ENCOUNTER — Encounter (HOSPITAL_COMMUNITY): Payer: Self-pay | Admitting: *Deleted

## 2014-09-14 ENCOUNTER — Ambulatory Visit (HOSPITAL_COMMUNITY)
Admission: RE | Admit: 2014-09-14 | Discharge: 2014-09-14 | Disposition: A | Discharge status: Home / Self Care | Payer: Medicare Other | Source: Ambulatory Visit | Attending: Emergency Medicine | Admitting: Emergency Medicine

## 2014-09-14 DIAGNOSIS — G8918 Other acute postprocedural pain: Secondary | ICD-10-CM | POA: Insufficient documentation

## 2014-09-14 DIAGNOSIS — E119 Type 2 diabetes mellitus without complications: Secondary | ICD-10-CM | POA: Insufficient documentation

## 2014-09-14 DIAGNOSIS — Z87891 Personal history of nicotine dependence: Secondary | ICD-10-CM | POA: Diagnosis not present

## 2014-09-14 DIAGNOSIS — Z86711 Personal history of pulmonary embolism: Secondary | ICD-10-CM | POA: Insufficient documentation

## 2014-09-14 DIAGNOSIS — Z9889 Other specified postprocedural states: Secondary | ICD-10-CM | POA: Diagnosis not present

## 2014-09-14 DIAGNOSIS — N186 End stage renal disease: Secondary | ICD-10-CM | POA: Diagnosis not present

## 2014-09-14 DIAGNOSIS — R791 Abnormal coagulation profile: Secondary | ICD-10-CM | POA: Insufficient documentation

## 2014-09-14 DIAGNOSIS — Z992 Dependence on renal dialysis: Secondary | ICD-10-CM | POA: Insufficient documentation

## 2014-09-14 DIAGNOSIS — M79622 Pain in left upper arm: Secondary | ICD-10-CM

## 2014-09-14 DIAGNOSIS — F419 Anxiety disorder, unspecified: Secondary | ICD-10-CM | POA: Diagnosis not present

## 2014-09-14 DIAGNOSIS — I120 Hypertensive chronic kidney disease with stage 5 chronic kidney disease or end stage renal disease: Secondary | ICD-10-CM | POA: Diagnosis not present

## 2014-09-14 DIAGNOSIS — Z7952 Long term (current) use of systemic steroids: Secondary | ICD-10-CM | POA: Insufficient documentation

## 2014-09-14 DIAGNOSIS — Z79899 Other long term (current) drug therapy: Secondary | ICD-10-CM | POA: Insufficient documentation

## 2014-09-14 DIAGNOSIS — Z7901 Long term (current) use of anticoagulants: Secondary | ICD-10-CM | POA: Insufficient documentation

## 2014-09-14 LAB — CBC WITH DIFFERENTIAL/PLATELET
BASOS PCT: 0 % (ref 0–1)
Basophils Absolute: 0 10*3/uL (ref 0.0–0.1)
EOS ABS: 0.2 10*3/uL (ref 0.0–0.7)
Eosinophils Relative: 3 % (ref 0–5)
HCT: 30.1 % — ABNORMAL LOW (ref 39.0–52.0)
HEMOGLOBIN: 9.3 g/dL — AB (ref 13.0–17.0)
Lymphocytes Relative: 16 % (ref 12–46)
Lymphs Abs: 1 10*3/uL (ref 0.7–4.0)
MCH: 24.7 pg — AB (ref 26.0–34.0)
MCHC: 30.9 g/dL (ref 30.0–36.0)
MCV: 79.8 fL (ref 78.0–100.0)
Monocytes Absolute: 0.9 10*3/uL (ref 0.1–1.0)
Monocytes Relative: 15 % — ABNORMAL HIGH (ref 3–12)
NEUTROS PCT: 66 % (ref 43–77)
Neutro Abs: 4.2 10*3/uL (ref 1.7–7.7)
Platelets: 180 10*3/uL (ref 150–400)
RBC: 3.77 MIL/uL — ABNORMAL LOW (ref 4.22–5.81)
RDW: 21.6 % — AB (ref 11.5–15.5)
WBC: 6.3 10*3/uL (ref 4.0–10.5)

## 2014-09-14 LAB — BASIC METABOLIC PANEL
ANION GAP: 7 (ref 5–15)
BUN: 26 mg/dL — ABNORMAL HIGH (ref 6–23)
CALCIUM: 7.9 mg/dL — AB (ref 8.4–10.5)
CO2: 25 mmol/L (ref 19–32)
Chloride: 103 mmol/L (ref 96–112)
Creatinine, Ser: 8.35 mg/dL — ABNORMAL HIGH (ref 0.50–1.35)
GFR calc Af Amer: 8 mL/min — ABNORMAL LOW (ref >90)
GFR, EST NON AFRICAN AMERICAN: 7 mL/min — AB (ref >90)
GLUCOSE: 110 mg/dL — AB (ref 70–99)
Potassium: 3.9 mmol/L (ref 3.5–5.1)
SODIUM: 135 mmol/L (ref 135–145)

## 2014-09-14 LAB — PROTIME-INR
INR: 1.18 (ref 0.00–1.49)
Prothrombin Time: 15.2 seconds (ref 11.6–15.2)

## 2014-09-14 MED ORDER — WARFARIN SODIUM 7.5 MG PO TABS
7.5000 mg | ORAL_TABLET | Freq: Once | ORAL | Status: AC
  Administered 2014-09-14: 7.5 mg via ORAL
  Filled 2014-09-14: qty 1

## 2014-09-14 MED ORDER — OXYCODONE-ACETAMINOPHEN 5-325 MG PO TABS
2.0000 | ORAL_TABLET | Freq: Once | ORAL | Status: AC
  Administered 2014-09-14: 2 via ORAL
  Filled 2014-09-14: qty 2

## 2014-09-14 MED ORDER — WARFARIN - PHYSICIAN DOSING INPATIENT: Freq: Every day | Status: DC

## 2014-09-14 NOTE — Discharge Instructions (Signed)
Your Coumadin level was low today.  You have been given an additional dose here in the emergency department.  It is been low for the last several times that it is been checked.  Please contact the doctor who prescribes Coumadin to you and talk with them about how you need to manage your Coumadin to have a good level.  You will need an ultrasound done of your left arm to check for blood clots.  This is been ordered for later this morning.  Follow the instructions below.  IMPORTANT PATIENT INSTRUCTIONS:  You have been scheduled for an Outpatient Vascular Study at [facility].  If tomorrow is a Saturday or Sunday, please go to the [facility] Emergency Department Registration Desk at 8:30 am tomorrow morning and tell them you are there for a vascular study.  If tomorrow is a weekday (Monday-Friday), please go to [place] Department at 8:30 am tomorrow morning  and  tell them you are there for a vascular study.   Heat Therapy Heat therapy can help make painful, stiff muscles and joints feel better. Do not use heat on new injuries. Wait at least 48 hours after an injury to use heat. Do not use heat when you have aches or pains right after an activity. If you still have pain 3 hours after stopping the activity, then you may use heat. HOME CARE Wet heat pack  Soak a clean towel in warm water. Squeeze out the extra water.  Put the warm, wet towel in a plastic bag.  Place a thin, dry towel between your skin and the bag.  Put the heat pack on the area for 5 minutes, and check your skin. Your skin may be pink, but it should not be red.  Leave the heat pack on the area for 15 to 30 minutes.  Repeat this every 2 to 4 hours while awake. Do not use heat while you are sleeping. Warm water bath  Fill a tub with warm water.  Place the affected body part in the tub.  Soak the area for 20 to 40 minutes.  Repeat as needed. Hot water bottle  Fill the water bottle half full with hot  water.  Press out the extra air. Close the cap tightly.  Place a dry towel between your skin and the bottle.  Put the bottle on the area for 5 minutes, and check your skin. Your skin may be pink, but it should not be red.  Leave the bottle on the area for 15 to 30 minutes.  Repeat this every 2 to 4 hours while awake. Electric heating pad  Place a dry towel between your skin and the heating pad.  Set the heating pad on low heat.  Put the heating pad on the area for 10 minutes, and check your skin. Your skin may be pink, but it should not be red.  Leave the heating pad on the area for 20 to 40 minutes.  Repeat this every 2 to 4 hours while awake.  Do not lie on the heating pad.  Do not fall asleep while using the heating pad.  Do not use the heating pad near water. GET HELP RIGHT AWAY IF:  You get blisters or red skin.  Your skin is puffy (swollen), or you lose feeling (numbness) in the affected area.  You have any new problems.  Your problems are getting worse.  You have any questions or concerns. If you have any problems, stop using  heat therapy until you see your doctor. MAKE SURE YOU:  Understand these instructions.  Will watch your condition.  Will get help right away if you are not doing well or get worse. Document Released: 09/30/2011 Document Reviewed: 08/31/2013 [facility] Patient Information 2015 [place]. This information is not intended to replace advice given to you by your health care provider. Make sure you discuss any questions you have with your health care provider.  Pain of Unknown Etiology (Pain Without a Known Cause) You have come to your caregiver because of pain. Pain can occur in any part of the body. Often there is not a definite cause. If your laboratory (blood or urine) work was normal and X-rays or other studies were normal, your caregiver may treat you without knowing the cause of the pain. An example of this is the headache. Most  headaches are diagnosed by taking a history. This means your caregiver asks you questions about your headaches. Your caregiver determines a treatment based on your answers. Usually testing done for headaches is normal. Often testing is not done unless there is no response to medications. Regardless of where your pain is located today, you can be given medications to make you comfortable. If no physical cause of pain can be found, most cases of pain will gradually leave as suddenly as they came.  If you have a painful condition and no reason can be found for the pain, it is important that you follow up with your caregiver. If the pain becomes worse or does not go away, it may be necessary to repeat tests and look further for a possible cause.  Only take over-the-counter or prescription medicines for pain, discomfort, or fever as directed by your caregiver.  For the protection of your privacy, test results cannot be given over the phone. Make sure you receive the results of your test. Ask how these results are to be obtained if you have not been informed. It is your responsibility to obtain your test results.  You may continue all activities unless the activities cause more pain. When the pain lessens, it is important to gradually resume normal activities. Resume activities by beginning slowly and gradually increasing the intensity and duration of the activities or exercise. During periods of severe pain, bed rest may be helpful. Lie or sit in any position that is comfortable.  Ice used for acute (sudden) conditions may be effective. Use a large plastic bag filled with ice and wrapped in a towel. This may provide pain relief.  See your caregiver for continued problems. Your caregiver can help or refer you for exercises or physical therapy if necessary. If you were given medications for your condition, do not drive, operate machinery or power tools, or sign legal documents for 24 hours. Do not drink  alcohol, take sleeping pills, or take other medications that may interfere with treatment. See your caregiver immediately if you have pain that is becoming worse and not relieved by medications. Document Released: 04/02/2001 Document Revised: 04/28/2013 Document Reviewed: 07/08/2005 [facility] Patient Information 2015 [place], [place]. This information is not intended to replace advice given to you by your health care provider. Make sure you discuss any questions you have with your health care provider.  Warfarin: What You Need to Know Warfarin is an anticoagulant. Anticoagulants help prevent the formation of blood clots. They also help stop the growth of blood clots. Warfarin is sometimes referred to as a "blood thinner."  Normally, when body tissues are cut or damaged,  the blood clots in order to prevent blood loss. Sometimes clots form inside your blood vessels and obstruct the flow of blood through your circulatory system (thrombosis). These clots may travel through your bloodstream and become lodged in smaller blood vessels in your brain, which can cause a stroke, or in your lungs (pulmonary embolism). WHO SHOULD USE WARFARIN? Warfarin is prescribed for people at risk of developing harmful blood clots:  People with surgically implanted mechanical heart valves, irregular heart rhythms called atrial fibrillation, and certain clotting disorders.  People who have developed harmful blood clotting in the past, including those who have had a stroke or a pulmonary embolism, or thrombosis in their legs (deep vein thrombosis [DVT]).  People with an existing blood clot, such as a pulmonary embolism. WARFARIN DOSING Warfarin tablets come in different strengths. Each tablet strength is a different color, with the amount of warfarin (in milligrams) clearly printed on the tablet. If the color of your tablet is different than usual when you receive a new prescription, report it immediately to your pharmacist or  health care provider. WARFARIN MONITORING The goal of warfarin therapy is to lessen the clotting tendency of blood but not prevent clotting completely. Your health care provider will monitor the anticoagulation effect of warfarin closely and adjust your dose as needed. For your safety, blood tests called prothrombin time (PT) or international normalized ratio (INR) are used to measure the effects of warfarin. Both of these tests can be done with a finger stick or a blood draw. The longer it takes the blood to clot, the higher the PT or INR. Your health care provider will inform you of your "target" PT or INR range. If, at any time, your PT or INR is above the target range, there is a risk of bleeding. If your PT or INR is below the target range, there is a risk of clotting. Whether you are started on warfarin while you are in the hospital or in your health care provider's office, you will need to have your PT or INR checked within one week of starting the medicine. Initially, some people are asked to have their PT or INR checked as much as twice a week. Once you are on a stable maintenance dose, the PT or INR is checked less often, usually once every 2 to 4 weeks. The warfarin dose may be adjusted if the PT or INR is not within the target range. It is important to keep all laboratory and health care provider follow-up appointments. Not keeping appointments could result in a chronic or permanent injury, pain, or disability because warfarin is a medicine that requires close monitoring. WHAT ARE THE SIDE EFFECTS OF WARFARIN?  Too much warfarin can cause bleeding (hemorrhage) from any part of the body. This may include bleeding from the gums, blood in the urine, bloody or dark stools, a nosebleed that is not easily stopped, coughing up blood, or vomiting blood.  Too little warfarin can increase the risk of blood clots.  Too little or too much warfarin can also increase the risk of a stroke.  Warfarin use  may cause a skin rash or irritation, an unusual fever, continual nausea or stomach upset, or severe pain in your joints or back. SPECIAL PRECAUTIONS WHILE TAKING WARFARIN Warfarin should be taken exactly as directed. It is very important to take warfarin as directed since bleeding or blood clots could result in chronic or permanent injury, pain, or disability.  Take your medicine at the same time  every day. If you forget to take your dose, you can take it if it is within 6 hours of when it was due.  Do not change the dose of warfarin on your own to make up for missed or extra doses.  If you miss more than 2 doses in a row, you should contact your health care provider for advice. Avoid situations that cause bleeding. You may have a tendency to bleed more easily than usual while taking warfarin. The following actions can limit bleeding:  Using a softer toothbrush.  Flossing with waxed floss rather than unwaxed floss.  Shaving with an Copy rather than a blade.  Limiting the use of sharp objects.  Avoiding potentially harmful activities, such as contact sports. Warfarin and Pregnancy or Breastfeeding  Warfarin is not advised during the first trimester of pregnancy due to an increased risk of birth defects. In certain situations, a woman may take warfarin after her first trimester of pregnancy. A woman who becomes pregnant or plans to become pregnant while taking warfarin should notify her health care provider immediately.  Although warfarin does not pass into breast milk, a woman who wishes to breastfeed while taking warfarin should also consult with her health care provider. Alcohol, Smoking, and Illicit Drug Use  Alcohol affects how warfarin works in the body. It is best to avoid alcoholic drinks or consume very small amounts while taking warfarin. In general, alcohol intake should be limited to 1 oz (30 mL) of liquor, 6 oz (180 mL) of wine, or 12 oz (360 mL) of beer each day.  Notify your health care provider if you change your alcohol intake.  Smoking affects how warfarin works. It is best to avoid smoking while taking warfarin. Notify your health care provider if you change your smoking habits.  It is best to avoid all illicit drugs while taking warfarin since there are few studies that show how warfarin interacts with these drugs. Other Medicines and Dietary Supplements Many prescription and over-the-counter medicines can interfere with warfarin. Be sure all of your health care providers know you are taking warfarin. Notify your health care provider who prescribed warfarin for you or your pharmacist before starting or stopping any new medicines, including over-the-counter vitamins, dietary supplements, and pain medicines. Your warfarin dose may need to be adjusted. Some common over-the-counter medicines that may increase the risk of bleeding while taking warfarin include:   Acetaminophen.  Aspirin.  Nonsteroidal anti-inflammatory medicines (NSAIDs), such as ibuprofen or naproxen.  Vitamin E. Dietary Considerations  Foods that have moderate or high amounts of vitamin K can interfere with warfarin. Avoid major changes in your diet or notify your health care provider before changing your diet. Eat a consistent amount of foods that have moderate or high amounts of vitamin K. Eating less foods containing vitamin K can increase the risk of bleeding. Eating more foods containing vitamin K can increase the risk of blood clots. Additional questions about dietary considerations can be discussed with a dietitian. Foods that are very high in vitamin K:  Greens, such as Swiss chard and beet, collard, mustard, or turnip greens (fresh or frozen, cooked).  Kale (fresh or frozen, cooked).  Parsley (raw).  Spinach (cooked). Foods that are high in vitamin K:  Asparagus (frozen, cooked).  Beans, green (frozen, cooked).  Broccoli.  Bok choy (cooked).  Brussels sprouts  (fresh or frozen, cooked).  Cabbage (cooked).   Coleslaw. Foods that are moderately high in vitamin K:  Blueberries.  Black-eyed peas.  Endive (raw).  Green leaf lettuce (raw).  Green scallions (raw).  Kale (raw).  Okra (frozen, cooked).  Plantains (fried).  Romaine lettuce (raw).  Sauerkraut (canned).  Spinach (raw). CALL YOUR CLINIC OR HEALTH CARE PROVIDER IF YOU:  Plan to have any surgery or procedure.  Feel sick, especially if you have diarrhea or vomiting.  Experience or anticipate any major changes in your diet.  Start or stop a prescription or over-the-counter medicine.  Become, plan to become, or think you may be pregnant.  Are having heavier than usual menstrual periods.  Have had a fall, accident, or any symptoms of bleeding or unusual bruising.  Develop an unusual fever. CALL 911 IN THE [place] OR GO TO THE EMERGENCY DEPARTMENT IF YOU:   Think you may be having an allergic reaction to warfarin. The signs of an allergic reaction could include itching, rash, hives, swelling, chest tightness, or trouble breathing.  See signs of blood in your urine. The signs could include reddish, pinkish, or tea-colored urine.  See signs of blood in your stools. The signs could include bright red or black stools.  Vomit or cough up blood. In these instances, the blood could have either a bright red or a "coffee-grounds" appearance.  Have bleeding that will not stop after applying pressure for 30 minutes such as cuts, nosebleeds, or other injuries.  Have severe pain in your joints or back.  Have a new and severe headache.  Have sudden weakness or numbness of your face, arm, or leg, especially on one side of your body.  Have sudden confusion or trouble understanding.  Have sudden trouble seeing in one or both eyes.  Have sudden trouble walking, dizziness, loss of balance, or coordination.  Have trouble speaking or understanding (aphasia). Document Released:  07/08/2005 Document Revised: 11/22/2013 Document Reviewed: 01/01/2013 [facility] Patient Information 2015 [place], [place]. This information is not intended to replace advice given to you by your health care provider. Make sure you discuss any questions you have with your health care provider.

## 2014-09-14 NOTE — Progress Notes (Signed)
*  Preliminary Results* Left upper extremity venous duplex completed. Left upper extremity is negative for deep and superficial vein thrombosis.  09/14/2014 9:00 AM  Maudry Mayhew, RVT, RDCS, RDMS

## 2014-09-14 NOTE — ED Provider Notes (Signed)
CSN: [id number]     Arrival date & time 09/14/14  0018 History  This chart was scribed for Kalman Drape, MD by Hilda Lias, ED Scribe. This patient was seen in room D32C/D32C and the patient's care was started at 1:41 AM.    Chief Complaint  Patient presents with  . Post-op Problem     The history is provided by the patient. No language interpreter was used.    HPI Comments: BARRY FAIRCLOTH is a 49 y.o. male who presents to the Emergency Department complaining of constant, worsening, left inner lower arm pain with associated swelling that has been present for 18 days ever since pt had a cardiac catheterization, in which they went through his left arm with the catheter. Pt states that his arm had been swollen since the procedure, but notes that he went to dialysis this morning, and the swelling decreased. Although the swelling decreased, pt states that his left arm pain worsened. Pt denies fever or chills.      Past Medical History  Diagnosis Date  . Hypertension   . Depression   . Complication of anesthesia     itching, sore throat  . Diabetes mellitus without complication     No history per patient, but remains under history as A1c would not be accurate given on dialysis  . Shortness of breath   . Anxiety   . ESRD (end stage renal disease)     due to HTN per patient, followed at [facility], s/p failed kidney transplant - dialysis Tue, Th, Sat   Past Surgical History  Procedure Laterality Date  . Kidney receipient  2006    failed and started HD in March 2014  . Capd insertion    . Capd removal    . Left heart catheterization with coronary angiogram N/A 09/02/2014    Procedure: LEFT HEART CATHETERIZATION WITH CORONARY ANGIOGRAM;  Surgeon: [name], MD;  Location: [facility] CATH LAB;  Service: Cardiovascular;  Laterality: N/A;   No family history on file. History  Substance Use Topics  . Smoking status: Former Smoker -- 1.00 packs/day for 1 years    Types: Cigarettes  .  Smokeless tobacco: Never Used     Comment: quit Jan 2014  . Alcohol Use: No    Review of Systems  Constitutional: Negative for fever and chills.  Musculoskeletal: Positive for myalgias.  All other systems reviewed and are negative.     Allergies  Ferrlecit and Darvocet  Home Medications   Prior to Admission medications   Medication Sig Start Date End Date Taking? Authorizing Provider  acetaminophen (TYLENOL) 325 MG tablet Take 2 tablets (650 mg total) by mouth every 4 (four) hours as needed for mild pain, fever or headache. 04/06/14   Delfina Redwood, MD  allopurinol (ZYLOPRIM) 100 MG tablet Take 100 mg by mouth daily.    Historical Provider, MD  amLODipine (NORVASC) 5 MG tablet Take 10 mg by mouth daily.    Historical Provider, MD  atorvastatin (LIPITOR) 40 MG tablet Take 1 tablet (40 mg total) by mouth daily at 6 PM. 09/02/14   Barton Dubois, MD  carvedilol (COREG) 12.5 MG tablet Take 1 tablet (12.5 mg total) by mouth 2 (two) times daily with a meal. 09/02/14   Barton Dubois, MD  cinacalcet (SENSIPAR) 30 MG tablet Take 30 mg by mouth daily.    Historical Provider, MD  cloNIDine (CATAPRES) 0.3 MG tablet Take 1 tablet (0.3 mg total) by mouth 3 (three) times  daily. 04/06/14   Delfina Redwood, MD  cyclobenzaprine (FLEXERIL) 10 MG tablet Take 1 tablet (10 mg total) by mouth 3 (three) times daily as needed. 05/05/14   Janice Norrie, MD  diazepam (VALIUM) 5 MG tablet Take 1 tablet (5 mg total) by mouth 2 (two) times daily. 01/25/14   [name], PA-C  enoxaparin (LOVENOX) 80 MG/0.8ML injection Inject 0.8 mLs (80 mg total) into the skin daily. 09/02/14   Barton Dubois, MD  hydrALAZINE (APRESOLINE) 25 MG tablet Take 50 mg by mouth 3 (three) times daily.    Historical Provider, MD  isosorbide dinitrate (ISORDIL) 40 MG tablet Take 40 mg by mouth every 8 (eight) hours.    Historical Provider, MD  minoxidil (LONITEN) 2.5 MG tablet Take 1 tablet (2.5 mg total) by mouth daily. 04/06/14   Delfina Redwood, MD  NIFEdipine (PROCARDIA-XL/ADALAT CC) 30 MG 24 hr tablet Take 1 tablet (30 mg total) by mouth daily. 04/06/14   Delfina Redwood, MD  omeprazole (PRILOSEC) 20 MG capsule Take 20 mg by mouth daily. 07/01/13   Historical Provider, MD  oxyCODONE (OXY IR/ROXICODONE) 5 MG immediate release tablet Take 1-2 tablets (5-10 mg total) by mouth every 6 (six) hours as needed for severe pain. 09/02/14   Barton Dubois, MD  polyethylene glycol [facility]) packet Take 17 g by mouth daily. For constipation 05/12/14   Domenic Polite, MD  predniSONE (DELTASONE) 2.5 MG tablet Take 2.5 mg by mouth 2 (two) times daily with a meal.    Historical Provider, MD  sevelamer carbonate (RENVELA) 800 MG tablet Take 1,600 mg by mouth 3 (three) times daily with meals. 12/27/13 12/27/14  Historical Provider, MD  tacrolimus (PROGRAF) 1 MG capsule Take 5 mg by mouth 2 (two) times daily.    Historical Provider, MD  traMADol (ULTRAM) 50 MG tablet Take 2 tablets (100 mg total) by mouth every 6 (six) hours as needed. 05/05/14   Janice Norrie, MD  warfarin (COUMADIN) 5 MG tablet Take 1.5 tablets (7.5 mg total) by mouth one time only at 6 PM. Take 39m daily at 6pm 09/02/14   CBarton Dubois MD   BP 141/83 mmHg  Pulse 77  Temp(Src) 98.4 F (36.9 C) (Oral)  Resp 17  Ht _0  (1.854 m)  Wt 173 lb (78.472 kg)  BMI 22.83 kg/m2  SpO2 96% Physical Exam  Constitutional: He is oriented to person, place, and time. He appears well-developed and well-nourished. No distress.  HENT:  Head: Normocephalic and atraumatic.  Cardiovascular: Normal rate, regular rhythm, normal heart sounds and intact distal pulses.  Exam reveals no gallop and no friction rub.   No murmur heard. Pulmonary/Chest: Effort normal and breath sounds normal. No respiratory distress. He has no wheezes. He has no rales. He exhibits no tenderness.  Abdominal: Soft. Bowel sounds are normal. He exhibits no distension and no mass. There is no tenderness. There is no rebound and  no guarding.  Musculoskeletal:  Patient has mild edema of both hands, nonpitting.  Patient has tenderness to palpation of medial epicondyle without fluctuance, erythema or warmth.  He has pain with supination.  There is no thrill or bruit over the left radial artery.  Refill is normal.  Pulses are normal.  Sensation is normal.  Neurological: He is alert and oriented to person, place, and time.  Skin: Skin is warm and dry. No rash noted. No erythema. No pallor.  Psychiatric: He has a normal mood and affect.  Nursing note and  vitals reviewed.   ED Course  Procedures (including critical care time)  DIAGNOSTIC STUDIES: Oxygen Saturation is 96% on RA, normal by my interpretation.    COORDINATION OF CARE: 1:48 AM Discussed treatment plan with pt at bedside and pt agreed to plan.   Labs Review Labs Reviewed  CBC WITH DIFFERENTIAL/PLATELET - Abnormal; Notable for the following:    RBC 3.77 (*)    Hemoglobin 9.3 (*)    HCT 30.1 (*)    MCH 24.7 (*)    RDW 21.6 (*)    Monocytes Relative 15 (*)    All other components within normal limits  BASIC METABOLIC PANEL - Abnormal; Notable for the following:    Glucose, Bld 110 (*)    BUN 26 (*)    Creatinine, Ser 8.35 (*)    Calcium 7.9 (*)    GFR calc non Af Amer 7 (*)    GFR calc Af Amer 8 (*)    All other components within normal limits  PROTIME-INR    Imaging Review Dg Chest 2 View  09/12/2014   CLINICAL DATA:  Chest pain.  EXAM: CHEST  2 VIEW  COMPARISON:  Radiographs and CT 08/30/2014  FINDINGS: Right-sided dialysis catheter remains in place. Cardiomegaly is stable. No pulmonary edema. There is mild peribronchial cuffing. No consolidation to suggest pneumonia. No pleural effusion or pneumothorax. No acute osseous abnormalities.  IMPRESSION: Mild peribronchial cuffing, may reflect bronchitis. Stable cardiomegaly.   Electronically Signed   By: Jeb Levering M.D.   On: 09/12/2014 03:58     EKG Interpretation None      MDM    Final diagnoses:  Left upper arm pain  Subtherapeutic international normalized ratio (INR)    49 year old male who is greater than 2 weeks out from a left radial artery heart catheterization, who is complaining of left arm swelling and pain to his medial elbow.  Patient has history of PE, is on Coumadin.  He reports pain with supination.  Do not have access to Doppler studies at this time of night.  Patient reports symptoms were worse today after dialysis.  Plan to check labs, and we'll refer to ultrasound in the a.m. for evaluation of his left upper arm.  Patient has mild swelling but no gross edema.  There is no thrill over the left radial artery to suggest pseudoaneurysm.  Patient seen in the emergency department 2 days ago for right arm pain, that time did not complain of any left arm pain or swelling, not documented on physical exam.  Patient reports he has run out of his pain medicine.  4 days ago.  I personally performed the services described in this documentation, which was scribed in my presence. The recorded information has been reviewed and is accurate.  Coumadin level is again subtherapeutic.  Patient instructed to follow-up in the morning for ultrasound of the left arm to check for possible DVT.  Patient stable for discharge at this time     Kalman Drape, MD 09/14/14 [phone]

## 2014-09-14 NOTE — ED Notes (Signed)
Patient presents with c/o left arm swelling since he had cardiac procedure 18 days ago.  Pain has gotten worse

## 2014-09-19 ENCOUNTER — Emergency Department (HOSPITAL_COMMUNITY): Payer: Medicare Other

## 2014-09-19 ENCOUNTER — Emergency Department (HOSPITAL_COMMUNITY)
Admission: EM | Admit: 2014-09-19 | Discharge: 2014-09-19 | Disposition: A | Discharge status: Home / Self Care | Payer: Medicare Other | Attending: Emergency Medicine | Admitting: Emergency Medicine

## 2014-09-19 ENCOUNTER — Encounter (HOSPITAL_COMMUNITY): Payer: Self-pay | Admitting: Emergency Medicine

## 2014-09-19 DIAGNOSIS — R079 Chest pain, unspecified: Secondary | ICD-10-CM | POA: Diagnosis not present

## 2014-09-19 DIAGNOSIS — R791 Abnormal coagulation profile: Secondary | ICD-10-CM | POA: Insufficient documentation

## 2014-09-19 DIAGNOSIS — Z87891 Personal history of nicotine dependence: Secondary | ICD-10-CM | POA: Insufficient documentation

## 2014-09-19 DIAGNOSIS — F329 Major depressive disorder, single episode, unspecified: Secondary | ICD-10-CM | POA: Diagnosis not present

## 2014-09-19 DIAGNOSIS — Z992 Dependence on renal dialysis: Secondary | ICD-10-CM | POA: Insufficient documentation

## 2014-09-19 DIAGNOSIS — Z7901 Long term (current) use of anticoagulants: Secondary | ICD-10-CM | POA: Insufficient documentation

## 2014-09-19 DIAGNOSIS — N186 End stage renal disease: Secondary | ICD-10-CM | POA: Insufficient documentation

## 2014-09-19 DIAGNOSIS — Z9889 Other specified postprocedural states: Secondary | ICD-10-CM | POA: Insufficient documentation

## 2014-09-19 DIAGNOSIS — E119 Type 2 diabetes mellitus without complications: Secondary | ICD-10-CM | POA: Insufficient documentation

## 2014-09-19 DIAGNOSIS — Z7952 Long term (current) use of systemic steroids: Secondary | ICD-10-CM | POA: Insufficient documentation

## 2014-09-19 DIAGNOSIS — M542 Cervicalgia: Secondary | ICD-10-CM | POA: Insufficient documentation

## 2014-09-19 DIAGNOSIS — M25512 Pain in left shoulder: Secondary | ICD-10-CM | POA: Insufficient documentation

## 2014-09-19 DIAGNOSIS — I120 Hypertensive chronic kidney disease with stage 5 chronic kidney disease or end stage renal disease: Secondary | ICD-10-CM | POA: Diagnosis not present

## 2014-09-19 DIAGNOSIS — F419 Anxiety disorder, unspecified: Secondary | ICD-10-CM | POA: Insufficient documentation

## 2014-09-19 DIAGNOSIS — Z79899 Other long term (current) drug therapy: Secondary | ICD-10-CM | POA: Diagnosis not present

## 2014-09-19 LAB — BASIC METABOLIC PANEL
Anion gap: 8 (ref 5–15)
BUN: 36 mg/dL — ABNORMAL HIGH (ref 6–23)
CALCIUM: 8.3 mg/dL — AB (ref 8.4–10.5)
CO2: 26 mmol/L (ref 19–32)
CREATININE: 8.74 mg/dL — AB (ref 0.50–1.35)
Chloride: 101 mmol/L (ref 96–112)
GFR calc Af Amer: 7 mL/min — ABNORMAL LOW (ref >90)
GFR calc non Af Amer: 6 mL/min — ABNORMAL LOW (ref >90)
Glucose, Bld: 110 mg/dL — ABNORMAL HIGH (ref 70–99)
Potassium: 5.2 mmol/L — ABNORMAL HIGH (ref 3.5–5.1)
Sodium: 135 mmol/L (ref 135–145)

## 2014-09-19 LAB — CBC
HCT: 30.3 % — ABNORMAL LOW (ref 39.0–52.0)
HEMOGLOBIN: 9.3 g/dL — AB (ref 13.0–17.0)
MCH: 24.7 pg — ABNORMAL LOW (ref 26.0–34.0)
MCHC: 30.7 g/dL (ref 30.0–36.0)
MCV: 80.6 fL (ref 78.0–100.0)
Platelets: 216 10*3/uL (ref 150–400)
RBC: 3.76 MIL/uL — AB (ref 4.22–5.81)
RDW: 20.2 % — ABNORMAL HIGH (ref 11.5–15.5)
WBC: 6.2 10*3/uL (ref 4.0–10.5)

## 2014-09-19 LAB — PROTIME-INR
INR: 1.24 (ref 0.00–1.49)
Prothrombin Time: 15.7 seconds — ABNORMAL HIGH (ref 11.6–15.2)

## 2014-09-19 LAB — I-STAT TROPONIN, ED: Troponin i, poc: 0.05 ng/mL (ref 0.00–0.08)

## 2014-09-19 MED ORDER — WARFARIN SODIUM 10 MG PO TABS
10.0000 mg | ORAL_TABLET | Freq: Once | ORAL | Status: AC
  Administered 2014-09-19: 10 mg via ORAL
  Filled 2014-09-19: qty 1

## 2014-09-19 MED ORDER — METHOCARBAMOL 750 MG PO TABS: 750.0000 mg | ORAL_TABLET | Freq: Four times a day (QID) | ORAL | Status: DC | PRN

## 2014-09-19 MED ORDER — WARFARIN - PHYSICIAN DOSING INPATIENT: Freq: Every day | Status: DC

## 2014-09-19 MED ORDER — OXYCODONE-ACETAMINOPHEN 5-325 MG PO TABS
2.0000 | ORAL_TABLET | Freq: Once | ORAL | Status: AC
Start: 2014-09-19 — End: 2014-09-19
  Administered 2014-09-19: 2 via ORAL
  Filled 2014-09-19: qty 2

## 2014-09-19 MED ORDER — METHOCARBAMOL 500 MG PO TABS
1000.0000 mg | ORAL_TABLET | Freq: Once | ORAL | Status: AC
  Administered 2014-09-19: 1000 mg via ORAL
  Filled 2014-09-19: qty 2

## 2014-09-19 NOTE — ED Notes (Addendum)
Pt states that he had a cardiac cath through his L arm 3 weeks ago and is now having L sided chest pain that started earlier today. Took his dialysis yesterday. Alert and oriented.

## 2014-09-19 NOTE — ED Notes (Signed)
Patient with c/o left side chest pain which radiates down the left arm since yesterday morning Patient states that the pain will go away if he rests on his left side Patient states that a few weeks ago he was seen in ED at [facility] and underwent a cardiac cath that was negative, per patient Patient denies having a cardiologist Patient with hx of requiring dialysis (both peritoneal and via right chest Udall catheter)--states he had dialysis yesterday Patient alert and oriented x 4

## 2014-09-19 NOTE — ED Provider Notes (Addendum)
CSN: [id number]     Arrival date & time 09/19/14  0035 History   First MD Initiated Contact with Patient 09/19/14 0114     Chief Complaint  Patient presents with  . Chest Pain     (Consider location/radiation/quality/duration/timing/severity/associated sxs/prior Treatment) HPI 49 year old male presents to the emergency department with complaint of left neck, shoulder and chest pain ongoing for last day and a half.  Patient denies any injury to the area.  He has had pain in his joints before.  Patient was seen in the emergency department last week for right shoulder pain, seen by me 5 days ago for left arm pain.  Patient status post heart catheterization on the 12th through his left radial artery.  Patient had negative vascular study after my evaluation.  Patient reports to me that he saw his primary care doctor at the outpatient clinics and they have adjusted his Coumadin level.  He reports that his dialysis center says that he needs his Coumadin level checked.  He denies any shortness of breath, diaphoresis.  Pain is worse with palpation and movement of the left shoulder. Past Medical History  Diagnosis Date  . Hypertension   . Depression   . Complication of anesthesia     itching, sore throat  . Diabetes mellitus without complication     No history per patient, but remains under history as A1c would not be accurate given on dialysis  . Shortness of breath   . Anxiety   . ESRD (end stage renal disease)     due to HTN per patient, followed at [facility], s/p failed kidney transplant - dialysis Tue, Th, Sat   Past Surgical History  Procedure Laterality Date  . Kidney receipient  2006    failed and started HD in March 2014  . Capd insertion    . Capd removal    . Left heart catheterization with coronary angiogram N/A 09/02/2014    Procedure: LEFT HEART CATHETERIZATION WITH CORONARY ANGIOGRAM;  Surgeon: [name], MD;  Location: [facility] CATH LAB;  Service: Cardiovascular;  Laterality: N/A;    History reviewed. No pertinent family history. History  Substance Use Topics  . Smoking status: Former Smoker -- 1.00 packs/day for 1 years    Types: Cigarettes  . Smokeless tobacco: Never Used     Comment: quit Jan 2014  . Alcohol Use: No    Review of Systems   See History of Present Illness; otherwise all other systems are reviewed and negative  Allergies  Ferrlecit and Darvocet  Home Medications   Prior to Admission medications   Medication Sig Start Date End Date Taking? Authorizing Provider  allopurinol (ZYLOPRIM) 100 MG tablet Take 100 mg by mouth daily.   Yes Historical Provider, MD  amLODipine (NORVASC) 5 MG tablet Take 10 mg by mouth daily.   Yes Historical Provider, MD  carvedilol (COREG) 25 MG tablet Take 25 mg by mouth 2 (two) times daily with a meal.   Yes Historical Provider, MD  cinacalcet (SENSIPAR) 30 MG tablet Take 30 mg by mouth daily.   Yes Historical Provider, MD  hydrALAZINE (APRESOLINE) 25 MG tablet Take 100 mg by mouth 3 (three) times daily.    Yes Historical Provider, MD  minoxidil (LONITEN) 2.5 MG tablet Take 1 tablet (2.5 mg total) by mouth daily. Patient taking differently: Take 5 mg by mouth daily.  04/06/14  Yes Delfina Redwood, MD  omeprazole (PRILOSEC) 20 MG capsule Take 20 mg by mouth daily. 07/01/13  Yes Historical Provider, MD  polyethylene glycol (MIRALAX) packet Take 17 g by mouth daily. For constipation Patient taking differently: Take 17 g by mouth daily as needed for mild constipation. For constipation 05/12/14  Yes Domenic Polite, MD  predniSONE (DELTASONE) 10 MG tablet Take 10 mg by mouth daily with breakfast.   Yes Historical Provider, MD  sevelamer carbonate (RENVELA) 800 MG tablet Take 1,600 mg by mouth 3 (three) times daily with meals. 12/27/13 12/27/14 Yes Historical Provider, MD  sulfamethoxazole-trimethoprim (BACTRIM,SEPTRA) 400-80 MG per tablet Take 1 tablet by mouth every Monday, Wednesday, and Friday.   Yes Historical Provider, MD   warfarin (COUMADIN) 5 MG tablet Take 1.5 tablets (7.5 mg total) by mouth one time only at 6 PM. Take 66m daily at 6pm Patient taking differently: Take 5 mg by mouth daily.  09/02/14  Yes CBarton Dubois MD  acetaminophen (TYLENOL) 325 MG tablet Take 2 tablets (650 mg total) by mouth every 4 (four) hours as needed for mild pain, fever or headache. Patient not taking: Reported on 09/19/2014 04/06/14   CDelfina Redwood MD  atorvastatin (LIPITOR) 40 MG tablet Take 1 tablet (40 mg total) by mouth daily at 6 PM. Patient not taking: Reported on 09/19/2014 09/02/14   CBarton Dubois MD  carvedilol (COREG) 12.5 MG tablet Take 1 tablet (12.5 mg total) by mouth 2 (two) times daily with a meal. Patient not taking: Reported on 09/19/2014 09/02/14   CBarton Dubois MD  cloNIDine (CATAPRES) 0.3 MG tablet Take 1 tablet (0.3 mg total) by mouth 3 (three) times daily. Patient not taking: Reported on 09/14/2014 04/06/14   CDelfina Redwood MD  cyclobenzaprine (FLEXERIL) 10 MG tablet Take 1 tablet (10 mg total) by mouth 3 (three) times daily as needed. Patient not taking: Reported on 09/14/2014 05/05/14   IJanice Norrie MD  diazepam (VALIUM) 5 MG tablet Take 1 tablet (5 mg total) by mouth 2 (two) times daily. Patient not taking: Reported on 09/19/2014 01/25/14   KAntonietta Breach PA-C  enoxaparin (LOVENOX) 80 MG/0.8ML injection Inject 0.8 mLs (80 mg total) into the skin daily. Patient not taking: Reported on 09/14/2014 09/02/14   CBarton Dubois MD  NIFEdipine (PROCARDIA-XL/ADALAT CC) 30 MG 24 hr tablet Take 1 tablet (30 mg total) by mouth daily. Patient not taking: Reported on 09/19/2014 04/06/14   CDelfina Redwood MD  oxyCODONE (OXY IR/ROXICODONE) 5 MG immediate release tablet Take 1-2 tablets (5-10 mg total) by mouth every 6 (six) hours as needed for severe pain. Patient not taking: Reported on 09/19/2014 09/02/14   CBarton Dubois MD  traMADol (ULTRAM) 50 MG tablet Take 2 tablets (100 mg total) by mouth every 6 (six) hours as  needed. Patient not taking: Reported on 09/14/2014 05/05/14   IJanice Norrie MD   BP 167/98 mmHg  Pulse 71  Temp(Src) 97.5 F (36.4 C) (Oral)  Resp 14  SpO2 100% Physical Exam  Constitutional: He is oriented to person, place, and time. He appears well-developed and well-nourished.  HENT:  Head: Normocephalic and atraumatic.  Nose: Nose normal.  Mouth/Throat: Oropharynx is clear and moist.  Eyes: Conjunctivae and EOM are normal. Pupils are equal, round, and reactive to light.  Neck: Normal range of motion. Neck supple. No JVD present. No tracheal deviation present. No thyromegaly present.  Cardiovascular: Normal rate, regular rhythm, normal heart sounds and intact distal pulses.  Exam reveals no gallop and no friction rub.   No murmur heard. Pulmonary/Chest: Effort normal and breath sounds normal. No stridor.  No respiratory distress. He has no wheezes. He has no rales. He exhibits no tenderness.  Abdominal: Soft. Bowel sounds are normal. He exhibits no distension and no mass. There is no tenderness. There is no rebound and no guarding.  Musculoskeletal: Normal range of motion. He exhibits tenderness (patient has significant tenderness over left trapezius, sternocleidomastoid, left shoulder, left anterior chest.  Any palpation or movement of these areas causes pain.). He exhibits no edema.  Lymphadenopathy:    He has no cervical adenopathy.  Neurological: He is alert and oriented to person, place, and time. He displays normal reflexes. He exhibits normal muscle tone. Coordination normal.  Skin: Skin is warm and dry. No rash noted. No erythema. No pallor.  Psychiatric: He has a normal mood and affect. His behavior is normal. Judgment and thought content normal.  Nursing note and vitals reviewed.   ED Course  Procedures (including critical care time) Labs Review Labs Reviewed  CBC - Abnormal; Notable for the following:    RBC 3.76 (*)    Hemoglobin 9.3 (*)    HCT 30.3 (*)    MCH 24.7  (*)    RDW 20.2 (*)    All other components within normal limits  BASIC METABOLIC PANEL - Abnormal; Notable for the following:    Potassium 5.2 (*)    Glucose, Bld 110 (*)    BUN 36 (*)    Creatinine, Ser 8.74 (*)    Calcium 8.3 (*)    GFR calc non Af Amer 6 (*)    GFR calc Af Amer 7 (*)    All other components within normal limits  PROTIME-INR - Abnormal; Notable for the following:    Prothrombin Time 15.7 (*)    All other components within normal limits  I-STAT TROPOININ, ED    Imaging Review Dg Chest 2 View  09/19/2014   CLINICAL DATA:  Chest pain  EXAM: CHEST  2 VIEW  COMPARISON:  09/12/2014  FINDINGS: Dialysis catheter from right IJ approach is stable, tip in the upper right atrium. Stable mild cardiomegaly and aortic tortuosity. There is no edema, consolidation, effusion, or pneumothorax.  IMPRESSION: No active cardiopulmonary disease.   Electronically Signed   By: Monte Fantasia M.D.   On: 09/19/2014 01:39     EKG Interpretation   Date/Time:  Monday September 19 2014 01:03:49 EST Ventricular Rate:  69 PR Interval:  169 QRS Duration: 86 QT Interval:  476 QTC Calculation: 510 R Axis:   -13 Text Interpretation:  Sinus rhythm Probable left atrial enlargement  Abnormal R-wave progression, late transition Probable left ventricular  hypertrophy Nonspecific T abnormalities, lateral leads Minimal ST  elevation, lateral leads Prolonged QT interval No significant change since  last tracing Confirmed by Camdin Hegner  MD, [place] Kirchner ([id number]) on 09/19/2014 1:46:25 AM      EKG Interpretation  Date/Time:  Monday September 19 2014 01:03:49 EST Ventricular Rate:  69 PR Interval:  169 QRS Duration: 86 QT Interval:  476 QTC Calculation: 510 R Axis:   -13 Text Interpretation:  Sinus rhythm Probable left atrial enlargement Abnormal R-wave progression, late transition Probable left ventricular hypertrophy Nonspecific T abnormalities, lateral leads Minimal ST elevation, lateral leads Prolonged QT  interval No significant change since last tracing Confirmed by Casara Perrier  MD, Jamarr Treinen ([id number]) on 09/19/2014 1:46:25 AM       MDM   Final diagnoses:  Left shoulder pain  Neck pain on left side  Subtherapeutic international normalized ratio (INR)    49 year old male who complains  of left shoulder, chest and neck pain.  EKG unchanged from prior.  Pain ongoing for last day and half.  Initial troponin is negative.  He also had negative cath done within the month.  I do not feel that this pain is secondary to cardiac ischemia.  It seems more musculoskeletal.  He has had multiple visits here and at [facility] for similar complaints.  He has been recommended to follow-up with primary care and/or chronic pain clinic.  Patient's Coumadin level, INR is again low.  He has been non-therapeutic for some time.  He is not tachypnea or tachycardic to indicate that he has a recurrence of his PE.  Patient receive dose of 10 mg of Coumadin here in the emergency department.  He is instructed to call the outpatient clinics in the morning for a follow-up appointment within the next 1-2 days.  He has been given Percocet here in the emergency department along with Robaxin.  I will provide him with a prescription for Robaxin upon discharge.  For ongoing opiate medications, he will need to get this done through his primary care doctor and/or chronic pain.    Kalman Drape, MD 09/19/14 [place], MD 09/19/14 [phone]

## 2014-09-19 NOTE — ED Notes (Signed)
While reviewing DC instructions and Rx with patient, patient asked if EDP "gave me any prescriptions for pain." Patient informed that Rx for pain medication were not included and that follow up with an OP clinic was recommended so that a referral to a pain clinic can be obtained Patient then stated that he "didn't want a prescription for pain medication anyway" Patient in NAD upon leaving ED at time of DC Patient states that he is going to sleep in the ED waiting room until his ride shows up

## 2014-09-19 NOTE — ED Notes (Signed)
Awaiting arrival of Coumadin from Pharmacy

## 2014-09-19 NOTE — ED Notes (Signed)
Discharge instructions reviewed with patient--agrees and verbalized understanding Patient informed of need to make and keep follow up appointment with OP clinic to set up pain management for chronic pain issues and for monitoring INR so that proper dose of Coumadin can be given--agrees and verbalized understanding DC Rx x 1 reviewed with patient--agrees and verbalized understanding VS updated and stable--reviewed with patient at time of DC--agrees and verbalized understanding Patient alert and oriented x 4 and in NAD at time of discharge All questions related to this ED visit, DC instructions, DC prescriptions and follow up care answered to patient's satisfaction by this nurse

## 2014-09-19 NOTE — Discharge Instructions (Signed)
Contact your primary care doctor's office, the outpatient internal medicine clinic for follow-up in one to 2 days.  Discuss with them your need for referral to chronic pain clinic.  You also have had low Coumadin levels for most of your most recent visits to the emergency department.  Please discuss with them the need to change your Coumadin dosing.  Take Robaxin as needed for muscle spasms and muscle soreness.  Warm moist heat over the area will help with pain.  Take your other medications as prescribed.    Arthralgia Your caregiver has diagnosed you as suffering from an arthralgia. Arthralgia means there is pain in a joint. This can come from many reasons including:  Bruising the joint which causes soreness (inflammation) in the joint.  Wear and tear on the joints which occur as we grow older (osteoarthritis).  Overusing the joint.  Various forms of arthritis.  Infections of the joint. Regardless of the cause of pain in your joint, most of these different pains respond to anti-inflammatory drugs and rest. The exception to this is when a joint is infected, and these cases are treated with antibiotics, if it is a bacterial infection. HOME CARE INSTRUCTIONS   Rest the injured area for as long as directed by your caregiver. Then slowly start using the joint as directed by your caregiver and as the pain allows. Crutches as directed may be useful if the ankles, knees or hips are involved. If the knee was splinted or casted, continue use and care as directed. If an stretchy or elastic wrapping bandage has been applied today, it should be removed and re-applied every 3 to 4 hours. It should not be applied tightly, but firmly enough to keep swelling down. Watch toes and feet for swelling, bluish discoloration, coldness, numbness or excessive pain. If any of these problems (symptoms) occur, remove the ace bandage and re-apply more loosely. If these symptoms persist, contact your caregiver or return to  this location.  For the first 24 hours, keep the injured extremity elevated on pillows while lying down.  Apply ice for 15-20 minutes to the sore joint every couple hours while awake for the first half day. Then 03-04 times per day for the first 48 hours. Put the ice in a plastic bag and place a towel between the bag of ice and your skin.  Wear any splinting, casting, elastic bandage applications, or slings as instructed.  Only take over-the-counter or prescription medicines for pain, discomfort, or fever as directed by your caregiver. Do not use aspirin immediately after the injury unless instructed by your physician. Aspirin can cause increased bleeding and bruising of the tissues.  If you were given crutches, continue to use them as instructed and do not resume weight bearing on the sore joint until instructed. Persistent pain and inability to use the sore joint as directed for more than 2 to 3 days are warning signs indicating that you should see a caregiver for a follow-up visit as soon as possible. Initially, a hairline fracture (break in bone) may not be evident on X-rays. Persistent pain and swelling indicate that further evaluation, non-weight bearing or use of the joint (use of crutches or slings as instructed), or further X-rays are indicated. X-rays may sometimes not show a small fracture until a week or 10 days later. Make a follow-up appointment with your own caregiver or one to whom we have referred you. A radiologist (specialist in reading X-rays) may read your X-rays. Make sure you know how  you are to obtain your X-ray results. Do not assume everything is normal if you do not hear from [place]. SEEK MEDICAL CARE IF: Bruising, swelling, or pain increases. SEEK IMMEDIATE MEDICAL CARE IF:   Your fingers or toes are numb or blue.  The pain is not responding to medications and continues to stay the same or get worse.  The pain in your joint becomes severe.  You develop a fever over 102  F (38.9 C).  It becomes impossible to move or use the joint. MAKE SURE YOU:   Understand these instructions.  Will watch your condition.  Will get help right away if you are not doing well or get worse. Document Released: 07/08/2005 Document Revised: 09/30/2011 Document Reviewed: 02/24/2008 [facility] Patient Information 2015 [place], [place]. This information is not intended to replace advice given to you by your health care provider. Make sure you discuss any questions you have with your health care provider.  Heat Therapy Heat therapy can help ease sore, stiff, injured, and tight muscles and joints. Heat relaxes your muscles, which may help ease your pain.  RISKS AND COMPLICATIONS If you have any of the following conditions, do not use heat therapy unless your health care provider has approved:  Poor circulation.  Healing wounds or scarred skin in the area being treated.  Diabetes, heart disease, or high blood pressure.  Not being able to feel (numbness) the area being treated.  Unusual swelling of the area being treated.  Active infections.  Blood clots.  Cancer.  Inability to communicate pain. This may include young children and people who have problems with their brain function (dementia).  Pregnancy. Heat therapy should only be used on old, pre-existing, or long-lasting (chronic) injuries. Do not use heat therapy on new injuries unless directed by your health care provider. HOW TO USE HEAT THERAPY There are several different kinds of heat therapy, including:  Moist heat pack.  Warm water bath.  Hot water bottle.  Electric heating pad.  Heated gel pack.  Heated wrap.  Electric heating pad. Use the heat therapy method suggested by your health care provider. Follow your health care provider's instructions on when and how to use heat therapy. GENERAL HEAT THERAPY RECOMMENDATIONS  Do not sleep while using heat therapy. Only use heat therapy while you are  awake.  Your skin may turn pink while using heat therapy. Do not use heat therapy if your skin turns red.  Do not use heat therapy if you have new pain.  High heat or long exposure to heat can cause burns. Be careful when using heat therapy to avoid burning your skin.  Do not use heat therapy on areas of your skin that are already irritated, such as with a rash or sunburn. SEEK MEDICAL CARE IF:  You have blisters, redness, swelling, or numbness.  You have new pain.  Your pain is worse. MAKE SURE YOU:  Understand these instructions.  Will watch your condition.  Will get help right away if you are not doing well or get worse. Document Released: 09/30/2011 Document Revised: 11/22/2013 Document Reviewed: 08/31/2013 [facility] Patient Information 2015 [place], [place]. This information is not intended to replace advice given to you by your health care provider. Make sure you discuss any questions you have with your health care provider.  Musculoskeletal Pain Musculoskeletal pain is muscle and boney aches and pains. These pains can occur in any part of the body. Your caregiver may treat you without knowing the cause of the pain.  They may treat you if blood or urine tests, X-rays, and other tests were normal.  CAUSES There is often not a definite cause or reason for these pains. These pains may be caused by a type of germ (virus). The discomfort may also come from overuse. Overuse includes working out too hard when your body is not fit. Boney aches also come from weather changes. Bone is sensitive to atmospheric pressure changes. HOME CARE INSTRUCTIONS   Ask when your test results will be ready. Make sure you get your test results.  Only take over-the-counter or prescription medicines for pain, discomfort, or fever as directed by your caregiver. If you were given medications for your condition, do not drive, operate machinery or power tools, or sign legal documents for 24 hours. Do not drink  alcohol. Do not take sleeping pills or other medications that may interfere with treatment.  Continue all activities unless the activities cause more pain. When the pain lessens, slowly resume normal activities. Gradually increase the intensity and duration of the activities or exercise.  During periods of severe pain, bed rest may be helpful. Lay or sit in any position that is comfortable.  Putting ice on the injured area.  Put ice in a bag.  Place a towel between your skin and the bag.  Leave the ice on for 15 to 20 minutes, 3 to 4 times a day.  Follow up with your caregiver for continued problems and no reason can be found for the pain. If the pain becomes worse or does not go away, it may be necessary to repeat tests or do additional testing. Your caregiver may need to look further for a possible cause. SEEK IMMEDIATE MEDICAL CARE IF:  You have pain that is getting worse and is not relieved by medications.  You develop chest pain that is associated with shortness or breath, sweating, feeling sick to your stomach (nauseous), or throw up (vomit).  Your pain becomes localized to the abdomen.  You develop any new symptoms that seem different or that concern you. MAKE SURE YOU:   Understand these instructions.  Will watch your condition.  Will get help right away if you are not doing well or get worse. Document Released: 07/08/2005 Document Revised: 09/30/2011 Document Reviewed: 03/12/2013 [facility] Patient Information 2015 [place], [place]. This information is not intended to replace advice given to you by your health care provider. Make sure you discuss any questions you have with your health care provider.  Shoulder Pain The shoulder is the joint that connects your arms to your body. The bones that form the shoulder joint include the upper arm bone (humerus), the shoulder blade (scapula), and the collarbone (clavicle). The top of the humerus is shaped like a ball and fits into a  rather flat socket on the scapula (glenoid cavity). A combination of muscles and strong, fibrous tissues that connect muscles to bones (tendons) support your shoulder joint and hold the ball in the socket. Small, fluid-filled sacs (bursae) are located in different areas of the joint. They act as cushions between the bones and the overlying soft tissues and help reduce friction between the gliding tendons and the bone as you move your arm. Your shoulder joint allows a wide range of motion in your arm. This range of motion allows you to do things like scratch your back or throw a ball. However, this range of motion also makes your shoulder more prone to pain from overuse and injury. Causes of shoulder pain can originate  from both injury and overuse and usually can be grouped in the following four categories:  Redness, swelling, and pain (inflammation) of the tendon (tendinitis) or the bursae (bursitis).  Instability, such as a dislocation of the joint.  Inflammation of the joint (arthritis).  Broken bone (fracture). HOME CARE INSTRUCTIONS   Apply ice to the sore area.  Put ice in a plastic bag.  Place a towel between your skin and the bag.  Leave the ice on for 15-20 minutes, 3-4 times per day for the first 2 days, or as directed by your health care provider.  Stop using cold packs if they do not help with the pain.  If you have a shoulder sling or immobilizer, wear it as long as your caregiver instructs. Only remove it to shower or bathe. Move your arm as little as possible, but keep your hand moving to prevent swelling.  Squeeze a soft ball or foam pad as much as possible to help prevent swelling.  Only take over-the-counter or prescription medicines for pain, discomfort, or fever as directed by your caregiver. SEEK MEDICAL CARE IF:   Your shoulder pain increases, or new pain develops in your arm, hand, or fingers.  Your hand or fingers become cold and numb.  Your pain is not  relieved with medicines. SEEK IMMEDIATE MEDICAL CARE IF:   Your arm, hand, or fingers are numb or tingling.  Your arm, hand, or fingers are significantly swollen or turn white or blue. MAKE SURE YOU:   Understand these instructions.  Will watch your condition.  Will get help right away if you are not doing well or get worse. Document Released: 04/17/2005 Document Revised: 11/22/2013 Document Reviewed: 06/22/2011 [facility] Patient Information 2015 [place], [place]. This information is not intended to replace advice given to you by your health care provider. Make sure you discuss any questions you have with your health care provider.  Warfarin: What You Need to Know Warfarin is an anticoagulant. Anticoagulants help prevent the formation of blood clots. They also help stop the growth of blood clots. Warfarin is sometimes referred to as a "blood thinner."  Normally, when body tissues are cut or damaged, the blood clots in order to prevent blood loss. Sometimes clots form inside your blood vessels and obstruct the flow of blood through your circulatory system (thrombosis). These clots may travel through your bloodstream and become lodged in smaller blood vessels in your brain, which can cause a stroke, or in your lungs (pulmonary embolism). WHO SHOULD USE WARFARIN? Warfarin is prescribed for people at risk of developing harmful blood clots:  People with surgically implanted mechanical heart valves, irregular heart rhythms called atrial fibrillation, and certain clotting disorders.  People who have developed harmful blood clotting in the past, including those who have had a stroke or a pulmonary embolism, or thrombosis in their legs (deep vein thrombosis [DVT]).  People with an existing blood clot, such as a pulmonary embolism. WARFARIN DOSING Warfarin tablets come in different strengths. Each tablet strength is a different color, with the amount of warfarin (in milligrams) clearly printed on the  tablet. If the color of your tablet is different than usual when you receive a new prescription, report it immediately to your pharmacist or health care provider. WARFARIN MONITORING The goal of warfarin therapy is to lessen the clotting tendency of blood but not prevent clotting completely. Your health care provider will monitor the anticoagulation effect of warfarin closely and adjust your dose as needed. For your safety, blood  tests called prothrombin time (PT) or international normalized ratio (INR) are used to measure the effects of warfarin. Both of these tests can be done with a finger stick or a blood draw. The longer it takes the blood to clot, the higher the PT or INR. Your health care provider will inform you of your "target" PT or INR range. If, at any time, your PT or INR is above the target range, there is a risk of bleeding. If your PT or INR is below the target range, there is a risk of clotting. Whether you are started on warfarin while you are in the hospital or in your health care provider's office, you will need to have your PT or INR checked within one week of starting the medicine. Initially, some people are asked to have their PT or INR checked as much as twice a week. Once you are on a stable maintenance dose, the PT or INR is checked less often, usually once every 2 to 4 weeks. The warfarin dose may be adjusted if the PT or INR is not within the target range. It is important to keep all laboratory and health care provider follow-up appointments. Not keeping appointments could result in a chronic or permanent injury, pain, or disability because warfarin is a medicine that requires close monitoring. WHAT ARE THE SIDE EFFECTS OF WARFARIN?  Too much warfarin can cause bleeding (hemorrhage) from any part of the body. This may include bleeding from the gums, blood in the urine, bloody or dark stools, a nosebleed that is not easily stopped, coughing up blood, or vomiting blood.  Too  little warfarin can increase the risk of blood clots.  Too little or too much warfarin can also increase the risk of a stroke.  Warfarin use may cause a skin rash or irritation, an unusual fever, continual nausea or stomach upset, or severe pain in your joints or back. SPECIAL PRECAUTIONS WHILE TAKING WARFARIN Warfarin should be taken exactly as directed. It is very important to take warfarin as directed since bleeding or blood clots could result in chronic or permanent injury, pain, or disability.  Take your medicine at the same time every day. If you forget to take your dose, you can take it if it is within 6 hours of when it was due.  Do not change the dose of warfarin on your own to make up for missed or extra doses.  If you miss more than 2 doses in a row, you should contact your health care provider for advice. Avoid situations that cause bleeding. You may have a tendency to bleed more easily than usual while taking warfarin. The following actions can limit bleeding:  Using a softer toothbrush.  Flossing with waxed floss rather than unwaxed floss.  Shaving with an Copy rather than a blade.  Limiting the use of sharp objects.  Avoiding potentially harmful activities, such as contact sports. Warfarin and Pregnancy or Breastfeeding  Warfarin is not advised during the first trimester of pregnancy due to an increased risk of birth defects. In certain situations, a woman may take warfarin after her first trimester of pregnancy. A woman who becomes pregnant or plans to become pregnant while taking warfarin should notify her health care provider immediately.  Although warfarin does not pass into breast milk, a woman who wishes to breastfeed while taking warfarin should also consult with her health care provider. Alcohol, Smoking, and Illicit Drug Use  Alcohol affects how warfarin works in the body. It  is best to avoid alcoholic drinks or consume very small amounts while taking  warfarin. In general, alcohol intake should be limited to 1 oz (30 mL) of liquor, 6 oz (180 mL) of wine, or 12 oz (360 mL) of beer each day. Notify your health care provider if you change your alcohol intake.  Smoking affects how warfarin works. It is best to avoid smoking while taking warfarin. Notify your health care provider if you change your smoking habits.  It is best to avoid all illicit drugs while taking warfarin since there are few studies that show how warfarin interacts with these drugs. Other Medicines and Dietary Supplements Many prescription and over-the-counter medicines can interfere with warfarin. Be sure all of your health care providers know you are taking warfarin. Notify your health care provider who prescribed warfarin for you or your pharmacist before starting or stopping any new medicines, including over-the-counter vitamins, dietary supplements, and pain medicines. Your warfarin dose may need to be adjusted. Some common over-the-counter medicines that may increase the risk of bleeding while taking warfarin include:   Acetaminophen.  Aspirin.  Nonsteroidal anti-inflammatory medicines (NSAIDs), such as ibuprofen or naproxen.  Vitamin E. Dietary Considerations  Foods that have moderate or high amounts of vitamin K can interfere with warfarin. Avoid major changes in your diet or notify your health care provider before changing your diet. Eat a consistent amount of foods that have moderate or high amounts of vitamin K. Eating less foods containing vitamin K can increase the risk of bleeding. Eating more foods containing vitamin K can increase the risk of blood clots. Additional questions about dietary considerations can be discussed with a dietitian. Foods that are very high in vitamin K:  Greens, such as Swiss chard and beet, collard, mustard, or turnip greens (fresh or frozen, cooked).  Kale (fresh or frozen, cooked).  Parsley (raw).  Spinach (cooked). Foods that are  high in vitamin K:  Asparagus (frozen, cooked).  Beans, green (frozen, cooked).  Broccoli.  Bok choy (cooked).  Brussels sprouts (fresh or frozen, cooked).  Cabbage (cooked).   Coleslaw. Foods that are moderately high in vitamin K:  Blueberries.  Black-eyed peas.  Endive (raw).  Green leaf lettuce (raw).  Green scallions (raw).  Kale (raw).  Okra (frozen, cooked).  Plantains (fried).  Romaine lettuce (raw).  Sauerkraut (canned).  Spinach (raw). CALL YOUR CLINIC OR HEALTH CARE PROVIDER IF YOU:  Plan to have any surgery or procedure.  Feel sick, especially if you have diarrhea or vomiting.  Experience or anticipate any major changes in your diet.  Start or stop a prescription or over-the-counter medicine.  Become, plan to become, or think you may be pregnant.  Are having heavier than usual menstrual periods.  Have had a fall, accident, or any symptoms of bleeding or unusual bruising.  Develop an unusual fever. CALL 911 IN THE [place] OR GO TO THE EMERGENCY DEPARTMENT IF YOU:   Think you may be having an allergic reaction to warfarin. The signs of an allergic reaction could include itching, rash, hives, swelling, chest tightness, or trouble breathing.  See signs of blood in your urine. The signs could include reddish, pinkish, or tea-colored urine.  See signs of blood in your stools. The signs could include bright red or black stools.  Vomit or cough up blood. In these instances, the blood could have either a bright red or a "coffee-grounds" appearance.  Have bleeding that will not stop after applying pressure for 30 minutes such as  cuts, nosebleeds, or other injuries.  Have severe pain in your joints or back.  Have a new and severe headache.  Have sudden weakness or numbness of your face, arm, or leg, especially on one side of your body.  Have sudden confusion or trouble understanding.  Have sudden trouble seeing in one or both eyes.  Have  sudden trouble walking, dizziness, loss of balance, or coordination.  Have trouble speaking or understanding (aphasia). Document Released: 07/08/2005 Document Revised: 11/22/2013 Document Reviewed: 01/01/2013 [facility] Patient Information 2015 [place], [place]. This information is not intended to replace advice given to you by your health care provider. Make sure you discuss any questions you have with your health care provider.

## 2014-09-19 NOTE — ED Notes (Signed)
Patient removed BP cuff and pulled cardiac leads off Patient refusing to be placed back on monitor Patient in NAD

## 2014-09-28 ENCOUNTER — Emergency Department (HOSPITAL_COMMUNITY): Payer: Medicare Other

## 2014-09-28 ENCOUNTER — Emergency Department (HOSPITAL_COMMUNITY)
Admission: EM | Admit: 2014-09-28 | Discharge: 2014-09-28 | Disposition: A | Discharge status: Home / Self Care | Payer: Medicare Other | Source: Home / Self Care | Attending: Emergency Medicine | Admitting: Emergency Medicine

## 2014-09-28 ENCOUNTER — Encounter (HOSPITAL_COMMUNITY): Payer: Self-pay | Admitting: Emergency Medicine

## 2014-09-28 DIAGNOSIS — I120 Hypertensive chronic kidney disease with stage 5 chronic kidney disease or end stage renal disease: Secondary | ICD-10-CM | POA: Diagnosis not present

## 2014-09-28 DIAGNOSIS — Z7901 Long term (current) use of anticoagulants: Secondary | ICD-10-CM | POA: Insufficient documentation

## 2014-09-28 DIAGNOSIS — Z992 Dependence on renal dialysis: Secondary | ICD-10-CM | POA: Insufficient documentation

## 2014-09-28 DIAGNOSIS — R110 Nausea: Secondary | ICD-10-CM

## 2014-09-28 DIAGNOSIS — Z79899 Other long term (current) drug therapy: Secondary | ICD-10-CM | POA: Insufficient documentation

## 2014-09-28 DIAGNOSIS — R791 Abnormal coagulation profile: Secondary | ICD-10-CM

## 2014-09-28 DIAGNOSIS — Z87891 Personal history of nicotine dependence: Secondary | ICD-10-CM | POA: Insufficient documentation

## 2014-09-28 DIAGNOSIS — F329 Major depressive disorder, single episode, unspecified: Secondary | ICD-10-CM | POA: Insufficient documentation

## 2014-09-28 DIAGNOSIS — E119 Type 2 diabetes mellitus without complications: Secondary | ICD-10-CM | POA: Insufficient documentation

## 2014-09-28 DIAGNOSIS — N186 End stage renal disease: Secondary | ICD-10-CM | POA: Insufficient documentation

## 2014-09-28 DIAGNOSIS — R112 Nausea with vomiting, unspecified: Secondary | ICD-10-CM | POA: Diagnosis not present

## 2014-09-28 DIAGNOSIS — Z7952 Long term (current) use of systemic steroids: Secondary | ICD-10-CM | POA: Insufficient documentation

## 2014-09-28 DIAGNOSIS — F419 Anxiety disorder, unspecified: Secondary | ICD-10-CM | POA: Insufficient documentation

## 2014-09-28 LAB — COMPREHENSIVE METABOLIC PANEL
ALK PHOS: 99 U/L (ref 39–117)
ALT: 20 U/L (ref 0–53)
AST: 46 U/L — ABNORMAL HIGH (ref 0–37)
Albumin: 3.1 g/dL — ABNORMAL LOW (ref 3.5–5.2)
Anion gap: 14 (ref 5–15)
BILIRUBIN TOTAL: 0.8 mg/dL (ref 0.3–1.2)
BUN: 73 mg/dL — ABNORMAL HIGH (ref 6–23)
CHLORIDE: 100 mmol/L (ref 96–112)
CO2: 23 mmol/L (ref 19–32)
Calcium: 8.7 mg/dL (ref 8.4–10.5)
Creatinine, Ser: 15.97 mg/dL — ABNORMAL HIGH (ref 0.50–1.35)
GFR calc Af Amer: 3 mL/min — ABNORMAL LOW (ref >90)
GFR calc non Af Amer: 3 mL/min — ABNORMAL LOW (ref >90)
Glucose, Bld: 85 mg/dL (ref 70–99)
Potassium: 4.7 mmol/L (ref 3.5–5.1)
Sodium: 137 mmol/L (ref 135–145)
TOTAL PROTEIN: 7.2 g/dL (ref 6.0–8.3)

## 2014-09-28 LAB — CBC WITH DIFFERENTIAL/PLATELET
BASOS ABS: 0 10*3/uL (ref 0.0–0.1)
Basophils Relative: 0 % (ref 0–1)
Eosinophils Absolute: 0.2 10*3/uL (ref 0.0–0.7)
Eosinophils Relative: 2 % (ref 0–5)
HEMATOCRIT: 26.5 % — AB (ref 39.0–52.0)
HEMOGLOBIN: 8.5 g/dL — AB (ref 13.0–17.0)
LYMPHS ABS: 1.2 10*3/uL (ref 0.7–4.0)
Lymphocytes Relative: 18 % (ref 12–46)
MCH: 24.7 pg — AB (ref 26.0–34.0)
MCHC: 32.1 g/dL (ref 30.0–36.0)
MCV: 77 fL — ABNORMAL LOW (ref 78.0–100.0)
MONO ABS: 0.7 10*3/uL (ref 0.1–1.0)
Monocytes Relative: 11 % (ref 3–12)
NEUTROS PCT: 69 % (ref 43–77)
Neutro Abs: 4.3 10*3/uL (ref 1.7–7.7)
Platelets: 149 10*3/uL — ABNORMAL LOW (ref 150–400)
RBC: 3.44 MIL/uL — ABNORMAL LOW (ref 4.22–5.81)
RDW: 19.3 % — ABNORMAL HIGH (ref 11.5–15.5)
WBC: 6.4 10*3/uL (ref 4.0–10.5)

## 2014-09-28 LAB — PROTIME-INR
INR: 1.82 — ABNORMAL HIGH (ref 0.00–1.49)
Prothrombin Time: 21.3 seconds — ABNORMAL HIGH (ref 11.6–15.2)

## 2014-09-28 LAB — TROPONIN I: TROPONIN I: 0.07 ng/mL — AB (ref <0.031)

## 2014-09-28 MED ORDER — ONDANSETRON 8 MG PO TBDP: 8.0000 mg | ORAL_TABLET | Freq: Three times a day (TID) | ORAL | Status: DC | PRN

## 2014-09-28 MED ORDER — ONDANSETRON 4 MG PO TBDP
8.0000 mg | ORAL_TABLET | Freq: Once | ORAL | Status: AC
  Administered 2014-09-28: 8 mg via ORAL
  Filled 2014-09-28: qty 2

## 2014-09-28 NOTE — ED Provider Notes (Signed)
CSN: [id number]     Arrival date & time 09/28/14  0238 History  This chart was scribed for Linton Flemings, MD by Delphia Grates, ED Scribe. This patient was seen in room D34C/D34C and the patient's care was started at 3:37 AM.    Chief Complaint  Patient presents with  . Nausea  . Shortness of Breath    The history is provided by the patient. No language interpreter was used.     HPI Comments: Frank [name] is a 49 y.o. male, with history of HTN, ESRD, DM,who presents to the Emergency Department complaining of nausea and vomiting for the past 2 days. He states he began to feel nausea after dialysis 4 days ago. Patient states he missed his dialysis appointment yesterday, but he is scheduled to have dialysis in 2.5 hours. Patient states he was informed at his last dialysis appointment (4 days ago) that his Coumadin level was low and increased his dosage from 84m to 7.519m However, he reports he has been unable to get this prescription filled. Patient states he has been without Coumadin for the past 2 days. He notes SOB and orthopnea, he notes this feels similar to a prior PE/DVT he had in October 2015 (no PE/DVT as of February 2016). Patient is concerned trhat he may develop another clot and is afraid to sleep. Patient has not slept in 2 days. He denies any other symptoms.  Past Medical History  Diagnosis Date  . Hypertension   . Depression   . Complication of anesthesia     itching, sore throat  . Diabetes mellitus without complication     No history per patient, but remains under history as A1c would not be accurate given on dialysis  . Shortness of breath   . Anxiety   . ESRD (end stage renal disease)     due to HTN per patient, [facility] - dialysis Tue, Th, Sat   Past Surgical History  Procedure Laterality Date  . Kidney receipient  2006    failed and started HD in March 2014  . Capd insertion    . Capd removal    . Left heart catheterization  with coronary angiogram N/A 09/02/2014    Procedure: LEFT HEART CATHETERIZATION WITH CORONARY ANGIOGRAM;  Surgeon: DaLeonie ManMD;  [facility];  Service: Cardiovascular;  Laterality: N/A;   No family history on file. History  Substance Use Topics  . Smoking status: Former Smoker -- 1.00 packs/day for 1 years    Types: Cigarettes  . Smokeless tobacco: Never Used     Comment: quit Jan 2014  . Alcohol Use: No    Review of Systems  Respiratory: Positive for shortness of breath.   Gastrointestinal: Positive for nausea and vomiting.  Psychiatric/Behavioral: Positive for sleep disturbance.  All other systems reviewed and are negative.     Allergies  Ferrlecit and Darvocet  Home Medications   Prior to Admission medications   Medication Sig Start Date End Date Taking? Authorizing Provider  acetaminophen (TYLENOL) 325 MG tablet Take 2 tablets (650 mg total) by mouth every 4 (four) hours as needed for mild pain, fever or headache. Patient not taking: Reported on 09/19/2014 04/06/14   CoDelfina RedwoodMD  allopurinol (ZYLOPRIM) 100 MG tablet Take 100 mg by mouth daily.    Historical Provider, MD  amLODipine (NORVASC) 5 MG tablet Take 10 mg by mouth daily.    Historical Provider, MD  atorvastatin (LIPITOR)  40 MG tablet Take 1 tablet (40 mg total) by mouth daily at 6 PM. Patient not taking: Reported on 09/19/2014 09/02/14   Barton Dubois, MD  carvedilol (COREG) 12.5 MG tablet Take 1 tablet (12.5 mg total) by mouth 2 (two) times daily with a meal. Patient not taking: Reported on 09/19/2014 09/02/14   Barton Dubois, MD  carvedilol (COREG) 25 MG tablet Take 25 mg by mouth 2 (two) times daily with a meal.    Historical Provider, MD  cinacalcet (SENSIPAR) 30 MG tablet Take 30 mg by mouth daily.    Historical Provider, MD  cloNIDine (CATAPRES) 0.3 MG tablet Take 1 tablet (0.3 mg total) by mouth 3 (three) times daily. Patient not taking: Reported on 09/14/2014 04/06/14   Delfina Redwood, MD  cyclobenzaprine (FLEXERIL) 10 MG tablet Take 1 tablet (10 mg total) by mouth 3 (three) times daily as needed. Patient not taking: Reported on 09/14/2014 05/05/14   Rolland Porter, MD  diazepam (VALIUM) 5 MG tablet Take 1 tablet (5 mg total) by mouth 2 (two) times daily. Patient not taking: Reported on 09/19/2014 01/25/14   [name], PA-C  enoxaparin (LOVENOX) 80 MG/0.8ML injection Inject 0.8 mLs (80 mg total) into the skin daily. Patient not taking: Reported on 09/14/2014 09/02/14   Barton Dubois, MD  hydrALAZINE (APRESOLINE) 25 MG tablet Take 100 mg by mouth 3 (three) times daily.     Historical Provider, MD  methocarbamol (ROBAXIN-750) 750 MG tablet Take 1 tablet (750 mg total) by mouth every 6 (six) hours as needed for muscle spasms. 09/19/14   Linton Flemings, MD  minoxidil (LONITEN) 2.5 MG tablet Take 1 tablet (2.5 mg total) by mouth daily. Patient taking differently: Take 5 mg by mouth daily.  04/06/14   Delfina Redwood, MD  NIFEdipine (PROCARDIA-XL/ADALAT CC) 30 MG 24 hr tablet Take 1 tablet (30 mg total) by mouth daily. Patient not taking: Reported on 09/19/2014 04/06/14   Delfina Redwood, MD  omeprazole (PRILOSEC) 20 MG capsule Take 20 mg by mouth daily. 07/01/13   Historical Provider, MD  oxyCODONE (OXY IR/ROXICODONE) 5 MG immediate release tablet Take 1-2 tablets (5-10 mg total) by mouth every 6 (six) hours as needed for severe pain. Patient not taking: Reported on 09/19/2014 09/02/14   Barton Dubois, MD  polyethylene glycol [facility]) packet Take 17 g by mouth daily. For constipation Patient taking differently: Take 17 g by mouth daily as needed for mild constipation. For constipation 05/12/14   Domenic Polite, MD  predniSONE (DELTASONE) 10 MG tablet Take 10 mg by mouth daily with breakfast.    Historical Provider, MD  sevelamer carbonate (RENVELA) 800 MG tablet Take 1,600 mg by mouth 3 (three) times daily with meals. 12/27/13 12/27/14  Historical Provider, MD   sulfamethoxazole-trimethoprim (BACTRIM,SEPTRA) 400-80 MG per tablet Take 1 tablet by mouth every Monday, Wednesday, and Friday.    Historical Provider, MD  traMADol (ULTRAM) 50 MG tablet Take 2 tablets (100 mg total) by mouth every 6 (six) hours as needed. Patient not taking: Reported on 09/14/2014 05/05/14   Rolland Porter, MD  warfarin (COUMADIN) 5 MG tablet Take 1.5 tablets (7.5 mg total) by mouth one time only at 6 PM. Take 44m daily at 6pm Patient taking differently: Take 5 mg by mouth daily.  09/02/14   CBarton Dubois MD   Triage Vitals: BP 182/115 mmHg  Pulse 80  Temp(Src) 97.6 F (36.4 C) (Oral)  Resp 17  Ht _0  (1.854 m)  Wt 170 lb (  77.111 kg)  BMI 22.43 kg/m2  SpO2 98%  Physical Exam  Constitutional: He is oriented to person, place, and time. He appears well-developed and well-nourished. No distress.  HENT:  Head: Normocephalic and atraumatic.  Nose: Nose normal.  Mouth/Throat: Oropharynx is clear and moist.  Eyes: Conjunctivae and EOM are normal. Pupils are equal, round, and reactive to light.  Neck: Normal range of motion. Neck supple. No JVD present. No tracheal deviation present. No thyromegaly present.  Cardiovascular: Normal rate, regular rhythm, normal heart sounds and intact distal pulses.  Exam reveals no gallop and no friction rub.   No murmur heard. Pulmonary/Chest: Effort normal. No stridor. No respiratory distress. He has no wheezes. He has rales. He exhibits no tenderness.  Rales on exam, right greater than left.  Abdominal: Soft. Bowel sounds are normal. He exhibits no distension and no mass. There is no tenderness. There is no rebound and no guarding.  Musculoskeletal: Normal range of motion. He exhibits no edema or tenderness.  Lymphadenopathy:    He has no cervical adenopathy.  Neurological: He is alert and oriented to person, place, and time. He displays normal reflexes. He exhibits normal muscle tone. Coordination normal.  Skin: Skin is warm and dry. No  rash noted. No erythema. No pallor.  Psychiatric: He has a normal mood and affect. His behavior is normal. Judgment and thought content normal.  Nursing note and vitals reviewed.   ED Course  Procedures (including critical care time)  DIAGNOSTIC STUDIES: Oxygen Saturation is 98% on room air, normal by my interpretation.    COORDINATION OF CARE: At [phone] Discussed treatment plan with patient. Patient agrees.   Labs Review Labs Reviewed  COMPREHENSIVE METABOLIC PANEL - Abnormal; Notable for the following:    BUN 73 (*)    Creatinine, Ser 15.97 (*)    Albumin 3.1 (*)    AST 46 (*)    GFR calc non Af Amer 3 (*)    GFR calc Af Amer 3 (*)    All other components within normal limits  TROPONIN I - Abnormal; Notable for the following:    Troponin I 0.07 (*)    All other components within normal limits  CBC WITH DIFFERENTIAL/PLATELET - Abnormal; Notable for the following:    RBC 3.44 (*)    Hemoglobin 8.5 (*)    HCT 26.5 (*)    MCV 77.0 (*)    MCH 24.7 (*)    RDW 19.3 (*)    Platelets 149 (*)    All other components within normal limits  PROTIME-INR - Abnormal; Notable for the following:    Prothrombin Time 21.3 (*)    INR 1.82 (*)    All other components within normal limits    Imaging Review No results found.   EKG Interpretation   Date/Time:  Wednesday September 28 2014 02:46:05 EST Ventricular Rate:  81 PR Interval:  158 QRS Duration: 90 QT Interval:  442 QTC Calculation: 513 R Axis:   -39 Text Interpretation:  Normal sinus rhythm Possible Left atrial enlargement  Left axis deviation Nonspecific ST and T wave abnormality Prolonged QT  Abnormal ECG Confirmed by Celisa Schoenberg  MD, Klaryssa Fauth ([id number]) on 09/28/2014 3:28:20 AM      MDM   Final diagnoses:  None    49 year old male with nausea, vomiting for the last 2 days, missing dialysis on Tuesday morning due to feeling unwell.  Patient has dialysis scheduled this morning at 6 AM.  He is noted to be hypertensive, has  history  of same.  Patient reports worsening shortness of breath, is concerned as his Coumadin levels have been low, that he made be developing another blood clot.  Patient found to have a blood clot this past fall, a small subsegmental clot.  Repeat CT scan done last month showed no blood clot.  Patient with rales on exam, suspect fluid overload secondary to missing dialysis.  Nausea controlled with Zofran.  Patient has chronically elevated troponin.  No changes in his EKG.  Patient not hyperkalemic or needing urgent dialysis.  Patient to be discharged with Zofran and follow up with his dialysis clinic this morning.  I personally performed the services described in this documentation, which was scribed in my presence. The recorded information has been reviewed and is accurate.    Linton Flemings, MD 09/28/14 [phone]

## 2014-09-28 NOTE — ED Notes (Signed)
Pt has had nausea and vomiting for the past 2 days, pt missed scheduled dialysis yesterday.  Pt is scheduled to have dialysis at 6am- reports that tonight while trying to sleep he has been short of breath and having sharp CP to center of chest.  Pt speaking in full sentences.

## 2014-09-28 NOTE — Discharge Instructions (Signed)
Please go to dialysis today as scheduled.  This will help with your shortness of breath.  Take nausea medicine as needed.  Return to the emergency department worsening condition or new concerning symptoms.   Dialysis Dialysis is a procedure that replaces some of the work healthy kidneys do. It is done when you lose about 85-90% of your kidney function. It may also be done earlier if your symptoms may be improved by dialysis. During dialysis, wastes, salt, and extra water are removed from the blood, and the levels of certain chemicals in the blood (such as potassium) are maintained. Dialysis is done in sessions. Dialysis sessions are continued until the kidneys get better. If the kidneys cannot get better, such as in end-stage kidney disease, dialysis is continued for life or until you receive a new kidney (kidney transplant). There are two types of dialysis: hemodialysis and peritoneal dialysis. WHAT IS HEMODIALYSIS?  Hemodialysis is a type of dialysis in which a machine called a dialyzer is used to filter the blood. Before beginning hemodialysis, you will have surgery to create a site where blood can be removed from the body and returned to the body (vascular access). There are three types of vascular accesses:  Arteriovenous fistula. To create this type of access, an artery is connected to a vein (usually in the arm). A fistula takes 1-6 months to develop after surgery. If it develops properly, it usually lasts longer than the other types of vascular accesses. It is also less likely to become infected and cause blood clots.  Arteriovenous graft. To create this type of access, an artery and a vein in the arm are connected with a tube. A graft may be used within 2-3 weeks of surgery.  A venous catheter. To create this type of access, a thin, flexible tube (catheter) is placed in a large vein in your neck, chest, or groin. A catheter may be used right away. It is usually used as a temporary access when  dialysis needs to begin immediately. During hemodialysis, blood leaves the body through your access. It travels through a tube to the dialyzer, where it is filtered. The blood then returns to your body through another tube. Hemodialysis is usually performed by a health care provider at a hospital or dialysis center three times a week. Visits last about 3-4 hours. It may also be performed with the help of another person at home with training.  WHAT IS PERITONEAL DIALYSIS? Peritoneal dialysis is a type of dialysis in which the thin lining of the abdomen (peritoneum) is used as a filter. Before beginning peritoneal dialysis, you will have surgery to place a catheter in your abdomen. The catheter will be used to transfer a fluid called dialysate to and from your abdomen. At the start of a session, your abdomen is filled with dialysate. During the session, wastes, salt, and extra water in the blood pass through the peritoneum and into the dialysate. The dialysate is drained from the body at the end of the session. The process of filling and draining the dialysate is called an exchange. Exchanges are repeated until you have used up all the dialysate for the day. Peritoneal dialysis may be performed by you at home or at almost any other location. It is done every day. You may need up to five exchanges a day. The amount of time the dialysate is in your body between exchanges is called a dwell. The dwell depends on the number of exchanges needed and the characteristics of  the peritoneum. It usually varies from 1.5-3 hours. You may go about your day normally between exchanges. Alternately, the exchanges may be done at night while you sleep, using a machine called a cycler. WHICH TYPE OF DIALYSIS SHOULD I CHOOSE?  Both hemodialysis and peritoneal dialysis have advantages and disadvantages. Talk to your health care provider about which type of dialysis would be best for you. Your lifestyle and preferences should be  considered along with your medical condition. In some cases, only one type of dialysis may be an option.  Advantages of hemodialysis  It is done less often than peritoneal dialysis.  Someone else can do the dialysis for you.  If you go to a dialysis center, your health care provider will be able to recognize any problems right away.  If you go to a dialysis center, you can interact with others who are having dialysis. This can provide you with emotional support. Disadvantages of hemodialysis  Hemodialysis may cause cramps and low blood pressure. It may leave you feeling tired on the days you have the treatment.  If you go to a dialysis center, you will need to make weekly appointments and work around the center's schedule.  You will need to take extra care when traveling. If you go to a dialysis center, you will need to make special arrangements to visit a dialysis center near your destination. If you are having treatments at home, you will need to take the dialyzer with you to your destination.  You will need to avoid more foods than you would need to avoid on peritoneal dialysis. Advantages of peritoneal dialysis  It is less likely than hemodialysis to cause cramps and low blood pressure.  You may do exchanges on your own wherever you are, including when you travel.  You do not need to avoid as many foods as you do on hemodialysis. Disadvantages of peritoneal dialysis  It is done more often than hemodialysis.  Performing peritoneal dialysis requires you to have dexterity of the hands. You must also be able to lift bags.  You will have to learn sterilization techniques. You will need to practice them every day to reduce the risk of infection. WHAT CHANGES WILL I NEED TO MAKE TO MY DIET DURING DIALYSIS? Both hemodialysis and peritoneal dialysis require you to make some changes to your diet. For example, you will need to limit your intake of foods high in the minerals phosphorus and  potassium. You will also need to limit your fluid intake. Your dietitian can help you plan meals. A good meal plan can improve your dialysis and your health.  WHAT SHOULD I EXPECT WHEN BEGINNING DIALYSIS? Adjusting to the dialysis treatment, schedule, and diet can take some time. You may need to stop working and may not be able to do some of the things you normally do. You may feel anxious or depressed when beginning dialysis. Eventually, many people feel better overall because of dialysis. Some people are able to return to work after making some changes, such as reducing work intensity. WHERE CAN I FIND MORE INFORMATION?   [place]: www.kidney.org  American Association of Kidney Patients: BombTimer.gl  [employer]: www.kidneyfund.org Document Released: 09/28/2002 Document Revised: 11/22/2013 Document Reviewed: 09/01/2012 [facility] Patient Information 2015 [place], [place]. This information is not intended to replace advice given to you by your health care provider. Make sure you discuss any questions you have with your health care provider.  Dialysis Diet A dialysis diet is a diet used  when you start dialysis treatment. During dialysis, wastes are removed from your blood when your kidneys cannot. Different foods create different wastes in your blood. Following the dialysis diet will lessen how much waste builds up in your body. It will also improve your dialysis and your health. FLUIDS  Each person on dialysis can have a different amount of fluid each day. Follow your dietitian's advice. Too much fluids can cause puffiness (swelling) and weight gain. This affects your blood pressure and can make your heart work harder.  Foods can also add fluid to your diet. These foods include:  Foods that are liquid at room temperature. This can include soup, gelatin dessert, and ice cream.  Certain fruits and vegetables. This can include melons, grapes, apples, oranges, tomatoes,  lettuce, and celery. POTASSIUM Potassium is a mineral found in foods and drinks and should be avoided. It affects how steadily your heart beats. If you eat high-potassium foods, eat smaller amounts. For example, eat half a pear instead of a whole pear. Talk to your dietitian about foods you can eat instead of high-potassium foods.  Some high-potassium foods and drinks to avoid include:  Apricots.  Lima beans.  Oranges.  Spinach.  Avocados.  Milk.  Melons.  Peanuts.  Prunes.  Tomatoes.  Bananas.  Cantaloupe.  Kiwi fruit.  Asparagus spears.  Raisins.  Winter squash.  Beets.  Orange juice.  Potatoes.  Yogurt. PHOSPHORUS  Phosphorus is a mineral found in foods. Avoid eating foods high in phosphorus. This includes milk, cheese, dried beans, peas, colas, nuts, and peanut butter. You may only be able to have  cup of milk a day. Follow your dietitian's advice about what you can eat and drink. PROTEIN  You may be told to eat as much "high quality protein" as you can. This type of protein comes from meat, fish, chicken, [place], and eggs. Low-fat meats (lean) are best. If you do not eat meat or animal products, ask your dietitian about ways to get protein. Low-fat milk may be a good protein choice. However, it is high in phosphorus, potassium, and adds to your daily fluid amount. Talk to your dietitian to see if milk fits into your food plan.  SODIUM  Sodium is found in foods and drinks and makes you thirsty. Eat foods that are naturally low in sodium. Talk to your dietitian about foods and spices without sodium. Stay away from canned foods and frozen dinners. Look for products labeled "low sodium." Do not use salt-like products called "salt substitutes."  CALORIES  You get calories from food. Calories give you energy. You may need to eat more or less calories. This depends on if you need to gain or lose weight. Talk to your dietitian about foods that are right for your  situation. VITAMINS AND MINERALS Vitamins and minerals may be missing from your diet. This is because you have to avoid so many foods. Talk to your dietitian or kidney doctor before choosing a vitamin or mineral pill (supplement). Many of these pills could be harmful to you. Document Released: 01/07/2012 Document Revised: 11/22/2013 Document Reviewed: 01/07/2012 [facility] Patient Information 2015 [place], [place]. This information is not intended to replace advice given to you by your health care provider. Make sure you discuss any questions you have with your health care provider.  Nausea and Vomiting Nausea is a sick feeling that often comes before throwing up (vomiting). Vomiting is a reflex where stomach contents come out of your mouth. Vomiting can cause severe loss  of body fluids (dehydration). Children and elderly adults can become dehydrated quickly, especially if they also have diarrhea. Nausea and vomiting are symptoms of a condition or disease. It is important to find the cause of your symptoms. CAUSES   Direct irritation of the stomach lining. This irritation can result from increased acid production (gastroesophageal reflux disease), infection, food poisoning, taking certain medicines (such as nonsteroidal anti-inflammatory drugs), alcohol use, or tobacco use.  Signals from the brain.These signals could be caused by a headache, heat exposure, an inner ear disturbance, increased pressure in the brain from injury, infection, a tumor, or a concussion, pain, emotional stimulus, or metabolic problems.  An obstruction in the gastrointestinal tract (bowel obstruction).  Illnesses such as diabetes, hepatitis, gallbladder problems, appendicitis, kidney problems, cancer, sepsis, atypical symptoms of a heart attack, or eating disorders.  Medical treatments such as chemotherapy and radiation.  Receiving medicine that makes you sleep (general anesthetic) during surgery. DIAGNOSIS Your caregiver  may ask for tests to be done if the problems do not improve after a few days. Tests may also be done if symptoms are severe or if the reason for the nausea and vomiting is not clear. Tests may include:  Urine tests.  Blood tests.  Stool tests.  Cultures (to look for evidence of infection).  X-rays or other imaging studies. Test results can help your caregiver make decisions about treatment or the need for additional tests. TREATMENT You need to stay well hydrated. Drink frequently but in small amounts.You may wish to drink water, sports drinks, clear broth, or eat frozen ice pops or gelatin dessert to help stay hydrated.When you eat, eating slowly may help prevent nausea.There are also some antinausea medicines that may help prevent nausea. HOME CARE INSTRUCTIONS   Take all medicine as directed by your caregiver.  If you do not have an appetite, do not force yourself to eat. However, you must continue to drink fluids.  If you have an appetite, eat a normal diet unless your caregiver tells you differently.  Eat a variety of complex carbohydrates (rice, wheat, potatoes, bread), lean meats, yogurt, fruits, and vegetables.  Avoid high-fat foods because they are more difficult to digest.  Drink enough water and fluids to keep your urine clear or pale yellow.  If you are dehydrated, ask your caregiver for specific rehydration instructions. Signs of dehydration may include:  Severe thirst.  Dry lips and mouth.  Dizziness.  Dark urine.  Decreasing urine frequency and amount.  Confusion.  Rapid breathing or pulse. SEEK IMMEDIATE MEDICAL CARE IF:   You have blood or brown flecks (like coffee grounds) in your vomit.  You have black or bloody stools.  You have a severe headache or stiff neck.  You are confused.  You have severe abdominal pain.  You have chest pain or trouble breathing.  You do not urinate at least once every 8 hours.  You develop cold or clammy  skin.  You continue to vomit for longer than 24 to 48 hours.  You have a fever. MAKE SURE YOU:   Understand these instructions.  Will watch your condition.  Will get help right away if you are not doing well or get worse. Document Released: 07/08/2005 Document Revised: 09/30/2011 Document Reviewed: 12/05/2010 [facility] Patient Information 2015 [place], [place]. This information is not intended to replace advice given to you by your health care provider. Make sure you discuss any questions you have with your health care provider.

## 2014-09-30 ENCOUNTER — Encounter (HOSPITAL_COMMUNITY): Payer: Self-pay | Admitting: Emergency Medicine

## 2014-09-30 ENCOUNTER — Telehealth: Payer: Self-pay | Admitting: *Deleted

## 2014-09-30 ENCOUNTER — Inpatient Hospital Stay (HOSPITAL_COMMUNITY)
Admission: EM | Admit: 2014-09-30 | Discharge: 2014-10-02 | DRG: 682 | Disposition: A | Discharge status: Home / Self Care | Payer: Medicare Other | Attending: Internal Medicine | Admitting: Internal Medicine

## 2014-09-30 ENCOUNTER — Emergency Department (HOSPITAL_COMMUNITY): Payer: Medicare Other

## 2014-09-30 DIAGNOSIS — N186 End stage renal disease: Secondary | ICD-10-CM

## 2014-09-30 DIAGNOSIS — I428 Other cardiomyopathies: Secondary | ICD-10-CM | POA: Diagnosis present

## 2014-09-30 DIAGNOSIS — Z791 Long term (current) use of non-steroidal anti-inflammatories (NSAID): Secondary | ICD-10-CM

## 2014-09-30 DIAGNOSIS — N2581 Secondary hyperparathyroidism of renal origin: Secondary | ICD-10-CM | POA: Diagnosis present

## 2014-09-30 DIAGNOSIS — I313 Pericardial effusion (noninflammatory): Secondary | ICD-10-CM | POA: Diagnosis present

## 2014-09-30 DIAGNOSIS — I3139 Other pericardial effusion (noninflammatory): Secondary | ICD-10-CM | POA: Diagnosis present

## 2014-09-30 DIAGNOSIS — Z992 Dependence on renal dialysis: Secondary | ICD-10-CM | POA: Diagnosis not present

## 2014-09-30 DIAGNOSIS — I2510 Atherosclerotic heart disease of native coronary artery without angina pectoris: Secondary | ICD-10-CM | POA: Diagnosis present

## 2014-09-30 DIAGNOSIS — IMO0002 Reserved for concepts with insufficient information to code with codable children: Secondary | ICD-10-CM | POA: Diagnosis present

## 2014-09-30 DIAGNOSIS — Z888 Allergy status to other drugs, medicaments and biological substances status: Secondary | ICD-10-CM

## 2014-09-30 DIAGNOSIS — I509 Heart failure, unspecified: Secondary | ICD-10-CM

## 2014-09-30 DIAGNOSIS — J810 Acute pulmonary edema: Secondary | ICD-10-CM | POA: Diagnosis present

## 2014-09-30 DIAGNOSIS — J069 Acute upper respiratory infection, unspecified: Secondary | ICD-10-CM | POA: Diagnosis present

## 2014-09-30 DIAGNOSIS — I5043 Acute on chronic combined systolic (congestive) and diastolic (congestive) heart failure: Secondary | ICD-10-CM | POA: Diagnosis present

## 2014-09-30 DIAGNOSIS — I120 Hypertensive chronic kidney disease with stage 5 chronic kidney disease or end stage renal disease: Secondary | ICD-10-CM | POA: Diagnosis present

## 2014-09-30 DIAGNOSIS — I429 Cardiomyopathy, unspecified: Secondary | ICD-10-CM | POA: Diagnosis not present

## 2014-09-30 DIAGNOSIS — R0600 Dyspnea, unspecified: Secondary | ICD-10-CM | POA: Diagnosis not present

## 2014-09-30 DIAGNOSIS — F32A Depression, unspecified: Secondary | ICD-10-CM | POA: Diagnosis present

## 2014-09-30 DIAGNOSIS — Z86718 Personal history of other venous thrombosis and embolism: Secondary | ICD-10-CM

## 2014-09-30 DIAGNOSIS — Z885 Allergy status to narcotic agent status: Secondary | ICD-10-CM | POA: Diagnosis not present

## 2014-09-30 DIAGNOSIS — Z7901 Long term (current) use of anticoagulants: Secondary | ICD-10-CM | POA: Diagnosis not present

## 2014-09-30 DIAGNOSIS — Z91148 Patient's other noncompliance with medication regimen for other reason: Secondary | ICD-10-CM | POA: Diagnosis present

## 2014-09-30 DIAGNOSIS — E1165 Type 2 diabetes mellitus with hyperglycemia: Secondary | ICD-10-CM | POA: Diagnosis present

## 2014-09-30 DIAGNOSIS — F411 Generalized anxiety disorder: Secondary | ICD-10-CM | POA: Diagnosis present

## 2014-09-30 DIAGNOSIS — G894 Chronic pain syndrome: Secondary | ICD-10-CM | POA: Diagnosis present

## 2014-09-30 DIAGNOSIS — I5031 Acute diastolic (congestive) heart failure: Secondary | ICD-10-CM | POA: Diagnosis not present

## 2014-09-30 DIAGNOSIS — I10 Essential (primary) hypertension: Secondary | ICD-10-CM | POA: Diagnosis not present

## 2014-09-30 DIAGNOSIS — E1129 Type 2 diabetes mellitus with other diabetic kidney complication: Secondary | ICD-10-CM | POA: Diagnosis present

## 2014-09-30 DIAGNOSIS — A09 Infectious gastroenteritis and colitis, unspecified: Secondary | ICD-10-CM | POA: Diagnosis not present

## 2014-09-30 DIAGNOSIS — I160 Hypertensive urgency: Secondary | ICD-10-CM

## 2014-09-30 DIAGNOSIS — D649 Anemia, unspecified: Secondary | ICD-10-CM | POA: Diagnosis present

## 2014-09-30 DIAGNOSIS — F329 Major depressive disorder, single episode, unspecified: Secondary | ICD-10-CM | POA: Diagnosis present

## 2014-09-30 DIAGNOSIS — R112 Nausea with vomiting, unspecified: Secondary | ICD-10-CM | POA: Diagnosis present

## 2014-09-30 DIAGNOSIS — Z9114 Patient's other noncompliance with medication regimen: Secondary | ICD-10-CM | POA: Diagnosis present

## 2014-09-30 LAB — CBC WITH DIFFERENTIAL/PLATELET
BASOS PCT: 0 % (ref 0–1)
Basophils Absolute: 0 10*3/uL (ref 0.0–0.1)
EOS ABS: 0.1 10*3/uL (ref 0.0–0.7)
Eosinophils Relative: 1 % (ref 0–5)
HCT: 27.1 % — ABNORMAL LOW (ref 39.0–52.0)
Hemoglobin: 8.5 g/dL — ABNORMAL LOW (ref 13.0–17.0)
Lymphocytes Relative: 17 % (ref 12–46)
Lymphs Abs: 1.1 10*3/uL (ref 0.7–4.0)
MCH: 25 pg — ABNORMAL LOW (ref 26.0–34.0)
MCHC: 31.4 g/dL (ref 30.0–36.0)
MCV: 79.7 fL (ref 78.0–100.0)
MONO ABS: 0.6 10*3/uL (ref 0.1–1.0)
Monocytes Relative: 10 % (ref 3–12)
NEUTROS ABS: 4.7 10*3/uL (ref 1.7–7.7)
Neutrophils Relative %: 72 % (ref 43–77)
Platelets: 155 10*3/uL (ref 150–400)
RBC: 3.4 MIL/uL — ABNORMAL LOW (ref 4.22–5.81)
RDW: 19.2 % — AB (ref 11.5–15.5)
WBC: 6.4 10*3/uL (ref 4.0–10.5)

## 2014-09-30 LAB — I-STAT TROPONIN, ED: Troponin i, poc: 0.06 ng/mL (ref 0.00–0.08)

## 2014-09-30 LAB — I-STAT CHEM 8, ED
BUN: 31 mg/dL — ABNORMAL HIGH (ref 6–23)
CALCIUM ION: 1.07 mmol/L — AB (ref 1.12–1.23)
Chloride: 99 mmol/L (ref 96–112)
Creatinine, Ser: 8.8 mg/dL — ABNORMAL HIGH (ref 0.50–1.35)
Glucose, Bld: 97 mg/dL (ref 70–99)
HCT: 31 % — ABNORMAL LOW (ref 39.0–52.0)
Hemoglobin: 10.5 g/dL — ABNORMAL LOW (ref 13.0–17.0)
Potassium: 4.7 mmol/L (ref 3.5–5.1)
Sodium: 136 mmol/L (ref 135–145)
TCO2: 21 mmol/L (ref 0–100)

## 2014-09-30 LAB — BRAIN NATRIURETIC PEPTIDE: B Natriuretic Peptide: >4500 pg/mL — ABNORMAL HIGH (ref 0.0–100.0)

## 2014-09-30 LAB — PROTIME-INR
INR: 1.45 (ref 0.00–1.49)
Prothrombin Time: 17.8 seconds — ABNORMAL HIGH (ref 11.6–15.2)

## 2014-09-30 LAB — APTT: APTT: 36 s (ref 24–37)

## 2014-09-30 MED ORDER — CLONIDINE HCL 0.2 MG PO TABS
0.2000 mg | ORAL_TABLET | Freq: Once | ORAL | Status: AC
  Administered 2014-09-30: 0.2 mg via ORAL
  Filled 2014-09-30: qty 1

## 2014-09-30 MED ORDER — ONDANSETRON 4 MG PO TBDP: 8.0000 mg | ORAL_TABLET | Freq: Three times a day (TID) | ORAL | Status: DC | PRN

## 2014-09-30 MED ORDER — NIFEDIPINE ER 30 MG PO TB24
30.0000 mg | ORAL_TABLET | Freq: Every day | ORAL | Status: DC
  Administered 2014-10-01 – 2014-10-02 (×2): 30 mg via ORAL
  Filled 2014-09-30 (×2): qty 1

## 2014-09-30 MED ORDER — CARVEDILOL 25 MG PO TABS
25.0000 mg | ORAL_TABLET | Freq: Two times a day (BID) | ORAL | Status: DC
  Administered 2014-10-01 – 2014-10-02 (×3): 25 mg via ORAL
  Filled 2014-09-30 (×5): qty 1

## 2014-09-30 MED ORDER — HYDRALAZINE HCL 50 MG PO TABS
100.0000 mg | ORAL_TABLET | Freq: Three times a day (TID) | ORAL | Status: DC
  Administered 2014-10-01 – 2014-10-02 (×5): 100 mg via ORAL
  Filled 2014-09-30 (×7): qty 2

## 2014-09-30 MED ORDER — ACETAMINOPHEN 650 MG RE SUPP: 650.0000 mg | Freq: Four times a day (QID) | RECTAL | Status: DC | PRN

## 2014-09-30 MED ORDER — HEPARIN (PORCINE) IN NACL 100-0.45 UNIT/ML-% IJ SOLN
1700.0000 [IU]/h | INTRAMUSCULAR | Status: DC
  Administered 2014-10-01: 950 [IU]/h via INTRAVENOUS
  Administered 2014-10-01: 1500 [IU]/h via INTRAVENOUS
  Filled 2014-09-30 (×2): qty 250

## 2014-09-30 MED ORDER — SULFAMETHOXAZOLE-TRIMETHOPRIM 400-80 MG PO TABS: 1.0000 | ORAL_TABLET | ORAL | Status: DC

## 2014-09-30 MED ORDER — ACETAMINOPHEN 325 MG PO TABS: 650.0000 mg | ORAL_TABLET | ORAL | Status: DC | PRN

## 2014-09-30 MED ORDER — HEPARIN SODIUM (PORCINE) 5000 UNIT/ML IJ SOLN: 5000.0000 [IU] | Freq: Three times a day (TID) | INTRAMUSCULAR | Status: DC

## 2014-09-30 MED ORDER — TRAMADOL HCL 50 MG PO TABS
100.0000 mg | ORAL_TABLET | Freq: Four times a day (QID) | ORAL | Status: DC | PRN
  Filled 2014-09-30: qty 2

## 2014-09-30 MED ORDER — ATORVASTATIN CALCIUM 40 MG PO TABS
40.0000 mg | ORAL_TABLET | Freq: Every day | ORAL | Status: DC
  Administered 2014-10-01: 40 mg via ORAL
  Filled 2014-09-30 (×2): qty 1

## 2014-09-30 MED ORDER — BENZONATATE 100 MG PO CAPS
100.0000 mg | ORAL_CAPSULE | Freq: Once | ORAL | Status: AC
  Administered 2014-09-30: 100 mg via ORAL
  Filled 2014-09-30: qty 1

## 2014-09-30 MED ORDER — PANTOPRAZOLE SODIUM 40 MG PO TBEC
40.0000 mg | DELAYED_RELEASE_TABLET | Freq: Every day | ORAL | Status: DC
  Administered 2014-10-01 – 2014-10-02 (×2): 40 mg via ORAL
  Filled 2014-09-30 (×2): qty 1

## 2014-09-30 MED ORDER — ALLOPURINOL 100 MG PO TABS
100.0000 mg | ORAL_TABLET | Freq: Every day | ORAL | Status: DC
  Administered 2014-10-01 – 2014-10-02 (×2): 100 mg via ORAL
  Filled 2014-09-30 (×2): qty 1

## 2014-09-30 MED ORDER — ONDANSETRON HCL 4 MG/2ML IJ SOLN: 4.0000 mg | Freq: Four times a day (QID) | INTRAMUSCULAR | Status: DC | PRN

## 2014-09-30 MED ORDER — POLYETHYLENE GLYCOL 3350 17 G PO PACK: 17.0000 g | PACK | Freq: Every day | ORAL | Status: DC | PRN

## 2014-09-30 MED ORDER — ONDANSETRON HCL 4 MG/2ML IJ SOLN
4.0000 mg | Freq: Once | INTRAMUSCULAR | Status: AC
  Administered 2014-09-30: 4 mg via INTRAVENOUS
  Filled 2014-09-30: qty 2

## 2014-09-30 MED ORDER — MINOXIDIL 2.5 MG PO TABS
5.0000 mg | ORAL_TABLET | Freq: Every day | ORAL | Status: DC
  Administered 2014-10-01 – 2014-10-02 (×2): 5 mg via ORAL
  Filled 2014-09-30 (×2): qty 2

## 2014-09-30 MED ORDER — LABETALOL HCL 5 MG/ML IV SOLN
10.0000 mg | INTRAVENOUS | Status: DC | PRN
  Administered 2014-09-30 – 2014-10-01 (×2): 10 mg via INTRAVENOUS
  Administered 2014-10-01: 20 mg via INTRAVENOUS
  Administered 2014-10-01: 10 mg via INTRAVENOUS
  Filled 2014-09-30 (×4): qty 4

## 2014-09-30 MED ORDER — SEVELAMER CARBONATE 800 MG PO TABS
1600.0000 mg | ORAL_TABLET | Freq: Three times a day (TID) | ORAL | Status: DC
  Administered 2014-10-01 – 2014-10-02 (×3): 1600 mg via ORAL
  Filled 2014-09-30 (×7): qty 2

## 2014-09-30 MED ORDER — SODIUM CHLORIDE 0.9 % IJ SOLN
3.0000 mL | Freq: Two times a day (BID) | INTRAMUSCULAR | Status: DC
  Administered 2014-10-01 – 2014-10-02 (×3): 3 mL via INTRAVENOUS

## 2014-09-30 MED ORDER — FENTANYL CITRATE 0.05 MG/ML IJ SOLN
50.0000 ug | Freq: Once | INTRAMUSCULAR | Status: AC
  Administered 2014-09-30: 50 ug via INTRAVENOUS
  Filled 2014-09-30: qty 2

## 2014-09-30 MED ORDER — PREDNISONE 10 MG PO TABS
10.0000 mg | ORAL_TABLET | Freq: Every day | ORAL | Status: DC
  Administered 2014-10-01 – 2014-10-02 (×2): 10 mg via ORAL
  Filled 2014-09-30 (×3): qty 1

## 2014-09-30 MED ORDER — CINACALCET HCL 30 MG PO TABS
30.0000 mg | ORAL_TABLET | Freq: Every day | ORAL | Status: DC
  Administered 2014-10-01 – 2014-10-02 (×2): 30 mg via ORAL
  Filled 2014-09-30 (×3): qty 1

## 2014-09-30 MED ORDER — ACETAMINOPHEN 325 MG PO TABS: 650.0000 mg | ORAL_TABLET | Freq: Four times a day (QID) | ORAL | Status: DC | PRN

## 2014-09-30 MED ORDER — AMLODIPINE BESYLATE 10 MG PO TABS
10.0000 mg | ORAL_TABLET | Freq: Every day | ORAL | Status: DC
  Administered 2014-10-01 – 2014-10-02 (×2): 10 mg via ORAL
  Filled 2014-09-30 (×2): qty 1

## 2014-09-30 NOTE — ED Notes (Signed)
Went in to round on pt and complete EKG. Upon arrival, pt was retching, and was given a washcloth and tissues. Pt informed that an EKG was ordered. He refused the EKG at this time, stating, "You can wait a while. Can't you see I'm throwing up here?" Dr. Audie Pinto notified.

## 2014-09-30 NOTE — H&P (Signed)
[facility] History and Physical  Frank [name] IZT:[id number] DOB: [birth year] DOA: 09/30/2014  Referring physician: EDP PCP: [name], NP   Chief Complaint: SOB   HPI: Frank [name] is a 49 y.o. male with h/o ESRD, dialysis TTS, recently seen 2 days ago after missing dialysis and developing acute CHF as a result with fluid overload.  Patient presents to the ED with SOB, DOE, orthopnea.  Today he developed N/V he denies fever subjectively (objectively has T of 100.5 in ED).  The N/V isnt very severe, but it did mean that he couldn't keep his BP meds down at home and as a result his BPs here in the ED have been running from 382N to 053Z systolic with diastolics from 767H and up.  Unsurprisingly the patients CXR does demonstrate pulmonary edema and pulmonary vascular congestion, though not as bad as it was 2 days ago after the missed dialysis session.  Review of Systems: Systems reviewed.  As above, otherwise negative  Past Medical History  Diagnosis Date  . Hypertension   . Depression   . Complication of anesthesia     itching, sore throat  . Diabetes mellitus without complication     No history per patient, but remains under history as A1c would not be accurate given on dialysis  . Shortness of breath   . Anxiety   . ESRD (end stage renal disease)     due to HTN per patient, followed at [facility], s/p failed kidney transplant - dialysis Tue, Th, Sat   Past Surgical History  Procedure Laterality Date  . Kidney receipient  2006    failed and started HD in March 2014  . Capd insertion    . Capd removal    . Left heart catheterization with coronary angiogram N/A 09/02/2014    Procedure: LEFT HEART CATHETERIZATION WITH CORONARY ANGIOGRAM;  Surgeon: [name], MD;  Location: [facility] CATH LAB;  Service: Cardiovascular;  Laterality: N/A;   Social History:  reports that he has quit smoking. His smoking use included Cigarettes. He has a 1 pack-year smoking history. He has never used  smokeless tobacco. He reports that he does not drink alcohol or use illicit drugs.  Allergies  Allergen Reactions  . Ferrlecit [Na Ferric Gluc Cplx In Sucrose] Shortness Of Breath, Swelling and Other (See Comments)    Swelling in throat  . Darvocet [Propoxyphene N-Acetaminophen] Hives    No family history on file.   Prior to Admission medications   Medication Sig Start Date End Date Taking? Authorizing Provider  acetaminophen (TYLENOL) 325 MG tablet Take 2 tablets (650 mg total) by mouth every 4 (four) hours as needed for mild pain, fever or headache. Patient not taking: Reported on 09/19/2014 04/06/14   Delfina Redwood, MD  allopurinol (ZYLOPRIM) 100 MG tablet Take 100 mg by mouth daily.    Historical Provider, MD  amLODipine (NORVASC) 5 MG tablet Take 10 mg by mouth daily.    Historical Provider, MD  atorvastatin (LIPITOR) 40 MG tablet Take 1 tablet (40 mg total) by mouth daily at 6 PM. Patient not taking: Reported on 09/19/2014 09/02/14   Barton Dubois, MD  carvedilol (COREG) 25 MG tablet Take 25 mg by mouth 2 (two) times daily with a meal.    Historical Provider, MD  cinacalcet (SENSIPAR) 30 MG tablet Take 30 mg by mouth daily.    Historical Provider, MD  hydrALAZINE (APRESOLINE) 25 MG tablet Take 100 mg by mouth 3 (three) times daily.  Historical Provider, MD  methocarbamol (ROBAXIN-750) 750 MG tablet Take 1 tablet (750 mg total) by mouth every 6 (six) hours as needed for muscle spasms. 09/19/14   Linton Flemings, MD  minoxidil (LONITEN) 2.5 MG tablet Take 1 tablet (2.5 mg total) by mouth daily. Patient taking differently: Take 5 mg by mouth daily.  04/06/14   Delfina Redwood, MD  NIFEdipine (PROCARDIA-XL/ADALAT CC) 30 MG 24 hr tablet Take 1 tablet (30 mg total) by mouth daily. Patient not taking: Reported on 09/19/2014 04/06/14   Delfina Redwood, MD  omeprazole (PRILOSEC) 20 MG capsule Take 20 mg by mouth daily. 07/01/13   Historical Provider, MD  ondansetron (ZOFRAN-ODT) 8 MG  disintegrating tablet Take 1 tablet (8 mg total) by mouth every 8 (eight) hours as needed for nausea or vomiting. 09/28/14   Linton Flemings, MD  oxyCODONE (OXY IR/ROXICODONE) 5 MG immediate release tablet Take 1-2 tablets (5-10 mg total) by mouth every 6 (six) hours as needed for severe pain. Patient not taking: Reported on 09/19/2014 09/02/14   Barton Dubois, MD  polyethylene glycol [facility]) packet Take 17 g by mouth daily. For constipation Patient taking differently: Take 17 g by mouth daily as needed for mild constipation. For constipation 05/12/14   Domenic Polite, MD  predniSONE (DELTASONE) 10 MG tablet Take 10 mg by mouth daily with breakfast.    Historical Provider, MD  sevelamer carbonate (RENVELA) 800 MG tablet Take 1,600 mg by mouth 3 (three) times daily with meals. 12/27/13 12/27/14  Historical Provider, MD  sulfamethoxazole-trimethoprim (BACTRIM,SEPTRA) 400-80 MG per tablet Take 1 tablet by mouth every Monday, Wednesday, and Friday.    Historical Provider, MD  traMADol (ULTRAM) 50 MG tablet Take 2 tablets (100 mg total) by mouth every 6 (six) hours as needed. Patient not taking: Reported on 09/14/2014 05/05/14   Rolland Porter, MD  warfarin (COUMADIN) 5 MG tablet Take 1.5 tablets (7.5 mg total) by mouth one time only at 6 PM. Take 70m daily at 6pm Patient taking differently: Take 5 mg by mouth daily.  09/02/14   CBarton Dubois MD   Physical Exam: Filed Vitals:   09/30/14 2330  BP: 179/124  Pulse:   Temp:   Resp: 18    BP 179/124 mmHg  Pulse 88  Temp(Src) 100.6 F (38.1 C) (Oral)  Resp 18  Ht 6' 1.5" (1.867 m)  Wt 77.111 kg (170 lb)  BMI 22.12 kg/m2  SpO2 99%  General Appearance:    Alert, oriented, no distress, appears stated age  Head:    Normocephalic, atraumatic  Eyes:    PERRL, EOMI, sclera non-icteric        Nose:   Nares without drainage or epistaxis. Mucosa, turbinates normal  Throat:   Moist mucous membranes. Oropharynx without erythema or exudate.  Neck:   Supple. No carotid  bruits.  No thyromegaly.  No lymphadenopathy.   Back:     No CVA tenderness, no spinal tenderness  Lungs:     B rhonchi  Chest wall:    No tenderness to palpitation  Heart:    Regular rate and rhythm without murmurs, gallops, rubs  Abdomen:     Soft, non-tender, nondistended, normal bowel sounds, no organomegaly  Genitalia:    deferred  Rectal:    deferred  Extremities:   No clubbing, cyanosis or edema.  Pulses:   2+ and symmetric all extremities  Skin:   Skin color, texture, turgor normal, no rashes or lesions  Lymph nodes:   Cervical, supraclavicular,  and axillary nodes normal  Neurologic:   CNII-XII intact. Normal strength, sensation and reflexes      throughout    Labs on Admission:  Basic Metabolic Panel:  Recent Labs Lab 09/28/14 0340 09/30/14 1602  NA 137 136  K 4.7 4.7  CL 100 99  CO2 23  --   GLUCOSE 85 97  BUN 73* 31*  CREATININE 15.97* 8.80*  CALCIUM 8.7  --    Liver Function Tests:  Recent Labs Lab 09/28/14 0340  AST 46*  ALT 20  ALKPHOS 99  BILITOT 0.8  PROT 7.2  ALBUMIN 3.1*   No results for input(s): LIPASE, AMYLASE in the last 168 hours. No results for input(s): AMMONIA in the last 168 hours. CBC:  Recent Labs Lab 09/28/14 0340 09/30/14 1556 09/30/14 1602  WBC 6.4 6.4  --   NEUTROABS 4.3 4.7  --   HGB 8.5* 8.5* 10.5*  HCT 26.5* 27.1* 31.0*  MCV 77.0* 79.7  --   PLT 149* 155  --    Cardiac Enzymes:  Recent Labs Lab 09/28/14 0340  TROPONINI 0.07*    BNP (last 3 results)  Recent Labs  01/02/14 0317 01/18/14 0048 04/15/14 0809  PROBNP >70000.0* >70000.0* >70000.0*   CBG: No results for input(s): GLUCAP in the last 168 hours.  Radiological Exams on Admission: Dg Chest 2 View  09/30/2014   CLINICAL DATA:  Vomiting, nausea, cough and sore throat for 1 week, history hypertension, diabetes, end-stage renal disease, anxiety, former smoker  EXAM: CHEST  2 VIEW  COMPARISON:  09/28/2014  FINDINGS: RIGHT jugular dual-lumen dialysis  catheter with tip projecting over cavoatrial junction.  Enlargement of cardiac silhouette with pulmonary vascular congestion.  Mediastinal contours normal.  Minimal peribronchial thickening.  Slightly improved perihilar edema.  No segmental consolidation, pleural effusion, or pneumothorax.  IMPRESSION: Enlargement of cardiac silhouette with pulmonary vascular congestion.  Minimal perihilar edema, improved   Electronically Signed   By: Lavonia Dana M.D.   On: 09/30/2014 16:20    EKG: Independently reviewed.  Assessment/Plan Principal Problem:   Hypertensive urgency Active Problems:   ESRD on dialysis- Tues, Thurs, Sat in [place], nephro: [name]   Malignant essential hypertension   Acute diastolic CHF (congestive heart failure)   Gastroenteritis presumed infectious   1. Hypertensive urgency - 1. Resume home BP meds 2. Labetalol PRN 2. ESRD -  1. Dialysis in AM, called and left message with ESRD phone line 2. I dont think that his symptoms are severe enough to require emergent dialysis tonight, and I suspect that inability to take BP meds at home due to N/V is playing more of a role than fluid overload from lack of dialysis here as he did reportedly have a session on Thursday. 3. Gastroenteritis presumed infectious - suspect he may have a case of the stomach virus that is going around town right now 1. zofran PRN nausea 2. Tylenol PRN fever    Code Status: Full Code  Family Communication: No family in room Disposition Plan: Admit to inpatient   Time spent: 70 min  Weslee Prestage M. [facility] Pager [phone]  If 7AM-7PM, please contact the day team taking care of the patient [email or web address] Password [facility] 09/30/2014, 11:43 PM

## 2014-09-30 NOTE — ED Notes (Signed)
Pt states unable to take bp medicine today due to nausea and vomiting, Dr. Audie Pinto made aware.

## 2014-09-30 NOTE — ED Notes (Signed)
Pt c/o nausea, vomiting and productive cough.  St's he was here 2 days ago for same and not any better.  Pt st's he was just nauseous until he took Zofran then started vomiting.  Pt also c/o chest pain

## 2014-09-30 NOTE — ED Notes (Signed)
Pt currently resting, this RN completed hourly rounding and pt requesting cough, nausea, and pain medicine. Pt told this RN to "hurry up with my medicine." this Rn explained process to pt and stated she would talk to doctor.

## 2014-09-30 NOTE — Telephone Encounter (Signed)
Pharmacy called because prescription ondansetron (ZOFRAN-ODT) 8 MG disintegrating tablet is not covered under insurance.  Pharmacy needed ok to change to pill.

## 2014-09-30 NOTE — ED Notes (Signed)
Pt from home for eval of abd pain with n/v/d x1 week, pt with recent dialysis cath for future peritoneal dialysis. Pt denies any fevers at this time. Tenderness noted to left quadrants. NAD noted.

## 2014-09-30 NOTE — ED Provider Notes (Signed)
CSN: [id number]     Arrival date & time 09/30/14  1521 History   First MD Initiated Contact with Patient 09/30/14 1737     Chief Complaint  Patient presents with  . Emesis      HPI Nausea and vomiting today.  Was here 2 nights ago with shortness of breath after missing dialysis.  Did go to dialysis on Wednesday and Thursday.  Due for dialysis tomorrow.  No known fever.  Denies diarrhea. Past Medical History  Diagnosis Date  . Hypertension   . Depression   . Complication of anesthesia     itching, sore throat  . Diabetes mellitus without complication     No history per patient, but remains under history as A1c would not be accurate given on dialysis  . Shortness of breath   . Anxiety   . ESRD (end stage renal disease)     due to HTN per patient, followed at [facility], s/p failed kidney transplant - dialysis Tue, Th, Sat   Past Surgical History  Procedure Laterality Date  . Kidney receipient  2006    failed and started HD in March 2014  . Capd insertion    . Capd removal    . Left heart catheterization with coronary angiogram N/A 09/02/2014    Procedure: LEFT HEART CATHETERIZATION WITH CORONARY ANGIOGRAM;  Surgeon: [name], MD;  Location: [facility] CATH LAB;  Service: Cardiovascular;  Laterality: N/A;   No family history on file. History  Substance Use Topics  . Smoking status: Former Smoker -- 1.00 packs/day for 1 years    Types: Cigarettes  . Smokeless tobacco: Never Used     Comment: quit Jan 2014  . Alcohol Use: No    Review of Systems  Constitutional: Positive for fever and chills.  Respiratory: Positive for shortness of breath.   All other systems reviewed and are negative.     Allergies  Ferrlecit and Darvocet  Home Medications   Prior to Admission medications   Medication Sig Start Date End Date Taking? Authorizing Provider  acetaminophen (TYLENOL) 325 MG tablet Take 2 tablets (650 mg total) by mouth every 4 (four) hours as needed for mild pain, fever or  headache. Patient not taking: Reported on 09/19/2014 04/06/14   Delfina Redwood, MD  allopurinol (ZYLOPRIM) 100 MG tablet Take 100 mg by mouth daily.    Historical Provider, MD  amLODipine (NORVASC) 5 MG tablet Take 10 mg by mouth daily.    Historical Provider, MD  atorvastatin (LIPITOR) 40 MG tablet Take 1 tablet (40 mg total) by mouth daily at 6 PM. Patient not taking: Reported on 09/19/2014 09/02/14   Barton Dubois, MD  carvedilol (COREG) 25 MG tablet Take 25 mg by mouth 2 (two) times daily with a meal.    Historical Provider, MD  cinacalcet (SENSIPAR) 30 MG tablet Take 30 mg by mouth daily.    Historical Provider, MD  hydrALAZINE (APRESOLINE) 25 MG tablet Take 100 mg by mouth 3 (three) times daily.     Historical Provider, MD  methocarbamol (ROBAXIN-750) 750 MG tablet Take 1 tablet (750 mg total) by mouth every 6 (six) hours as needed for muscle spasms. 09/19/14   Linton Flemings, MD  minoxidil (LONITEN) 2.5 MG tablet Take 1 tablet (2.5 mg total) by mouth daily. Patient taking differently: Take 5 mg by mouth daily.  04/06/14   Delfina Redwood, MD  NIFEdipine (PROCARDIA-XL/ADALAT CC) 30 MG 24 hr tablet Take 1 tablet (30 mg total) by mouth  daily. Patient not taking: Reported on 09/19/2014 04/06/14   Delfina Redwood, MD  omeprazole (PRILOSEC) 20 MG capsule Take 20 mg by mouth daily. 07/01/13   Historical Provider, MD  ondansetron (ZOFRAN-ODT) 8 MG disintegrating tablet Take 1 tablet (8 mg total) by mouth every 8 (eight) hours as needed for nausea or vomiting. 09/28/14   Linton Flemings, MD  oxyCODONE (OXY IR/ROXICODONE) 5 MG immediate release tablet Take 1-2 tablets (5-10 mg total) by mouth every 6 (six) hours as needed for severe pain. Patient not taking: Reported on 09/19/2014 09/02/14   Barton Dubois, MD  polyethylene glycol [facility]) packet Take 17 g by mouth daily. For constipation Patient taking differently: Take 17 g by mouth daily as needed for mild constipation. For constipation 05/12/14   Domenic Polite, MD  predniSONE (DELTASONE) 10 MG tablet Take 10 mg by mouth daily with breakfast.    Historical Provider, MD  sevelamer carbonate (RENVELA) 800 MG tablet Take 1,600 mg by mouth 3 (three) times daily with meals. 12/27/13 12/27/14  Historical Provider, MD  sulfamethoxazole-trimethoprim (BACTRIM,SEPTRA) 400-80 MG per tablet Take 1 tablet by mouth every Monday, Wednesday, and Friday.    Historical Provider, MD  traMADol (ULTRAM) 50 MG tablet Take 2 tablets (100 mg total) by mouth every 6 (six) hours as needed. Patient not taking: Reported on 09/14/2014 05/05/14   Rolland Porter, MD  warfarin (COUMADIN) 5 MG tablet Take 1.5 tablets (7.5 mg total) by mouth one time only at 6 PM. Take 21m daily at 6pm Patient taking differently: Take 5 mg by mouth daily.  09/02/14   CBarton Dubois MD   BP 207/162 mmHg  Pulse 90  Temp(Src) 100.6 F (38.1 C) (Oral)  Resp 37  Ht 6' 1.5" (1.867 m)  Wt 170 lb (77.111 kg)  BMI 22.12 kg/m2  SpO2 96% Physical Exam  Constitutional: He is oriented to person, place, and time. He appears well-developed and well-nourished. No distress.  HENT:  Head: Normocephalic and atraumatic.  Eyes: Pupils are equal, round, and reactive to light.  Neck: Normal range of motion.  Cardiovascular: Normal rate and intact distal pulses.   Pulmonary/Chest: No respiratory distress. He has rhonchi. He has rales.  Abdominal: Normal appearance. He exhibits no distension.  Musculoskeletal: Normal range of motion.  Neurological: He is alert and oriented to person, place, and time. No cranial nerve deficit.  Skin: Skin is warm and dry. No rash noted.  Psychiatric: He has a normal mood and affect. His behavior is normal.  Nursing note and vitals reviewed.   ED Course  Procedures (including critical care time) Labs Review Labs Reviewed  CBC WITH DIFFERENTIAL/PLATELET - Abnormal; Notable for the following:    RBC 3.40 (*)    Hemoglobin 8.5 (*)    HCT 27.1 (*)    MCH 25.0 (*)    RDW 19.2 (*)     All other components within normal limits  BRAIN NATRIURETIC PEPTIDE - Abnormal; Notable for the following:    B Natriuretic Peptide >4500.0 (*)    All other components within normal limits  PROTIME-INR - Abnormal; Notable for the following:    Prothrombin Time 17.8 (*)    All other components within normal limits  I-STAT CHEM 8, ED - Abnormal; Notable for the following:    BUN 31 (*)    Creatinine, Ser 8.80 (*)    Calcium, Ion 1.07 (*)    Hemoglobin 10.5 (*)    HCT 31.0 (*)    All other components within  normal limits  APTT  I-STAT TROPOININ, ED    Imaging Review Dg Chest 2 View  09/30/2014   CLINICAL DATA:  Vomiting, nausea, cough and sore throat for 1 week, history hypertension, diabetes, end-stage renal disease, anxiety, former smoker  EXAM: CHEST  2 VIEW  COMPARISON:  09/28/2014  FINDINGS: RIGHT jugular dual-lumen dialysis catheter with tip projecting over cavoatrial junction.  Enlargement of cardiac silhouette with pulmonary vascular congestion.  Mediastinal contours normal.  Minimal peribronchial thickening.  Slightly improved perihilar edema.  No segmental consolidation, pleural effusion, or pneumothorax.  IMPRESSION: Enlargement of cardiac silhouette with pulmonary vascular congestion.  Minimal perihilar edema, improved   Electronically Signed   By: Lavonia Dana M.D.   On: 09/30/2014 16:20     EKG Interpretation   Date/Time:  Friday September 30 2014 19:14:49 EST Ventricular Rate:  90 PR Interval:  175 QRS Duration: 84 QT Interval:  391 QTC Calculation: 478 R Axis:   11 Text Interpretation:  Age not entered, assumed to be  49 years old for  purpose of ECG interpretation Sinus rhythm Probable left atrial  enlargement Abnormal R-wave progression, late transition Left ventricular  hypertrophy Borderline prolonged QT interval No significant change since  last tracing Confirmed by Kylle Lall  MD, Javanni Maring ([id number]) on 09/30/2014 7:21:47  PM      MDM   Final diagnoses:   Hypertensive urgency  Congestive heart failure, unspecified congestive heart failure chronicity, unspecified congestive heart failure type        Leonard Schwartz, MD 09/30/14 2318

## 2014-09-30 NOTE — ED Notes (Signed)
This RN tried to start access for IV, pt states he will not have IV in hand. Refusing to have IV placed in this location. This RN tried for [facility] IV and unsuccessful. Charge RN made aware and en route to start IV.

## 2014-09-30 NOTE — Progress Notes (Addendum)
ANTICOAGULATION CONSULT NOTE - Initial Consult  Pharmacy Consult for Heparin and coumadin Indication: h/o PE. Bridge to coumadin until INR theraputic  Allergies  Allergen Reactions  . Ferrlecit [Na Ferric Gluc Cplx In Sucrose] Shortness Of Breath, Swelling and Other (See Comments)    Swelling in throat  . Darvocet [Propoxyphene N-Acetaminophen] Hives    Patient Measurements: Height: 6' 1.5" (186.7 cm) Weight: 170 lb (77.111 kg) IBW/kg (Calculated) : 81.05 Heparin Dosing Weight: 77 kg  Vital Signs: Temp: 100.6 F (38.1 C) (03/11 1917) Temp Source: Oral (03/11 1917) BP: 207/162 mmHg (03/11 2230) Pulse Rate: 90 (03/11 2230)  Labs:  Recent Labs  09/28/14 0340 09/30/14 1556 09/30/14 1602  HGB 8.5* 8.5* 10.5*  HCT 26.5* 27.1* 31.0*  PLT 149* 155  --   APTT  --  36  --   LABPROT 21.3* 17.8*  --   INR 1.82* 1.45  --   CREATININE 15.97*  --  8.80*  TROPONINI 0.07*  --   --     Estimated Creatinine Clearance: 11 mL/min (by C-G formula based on Cr of 8.8).   Medical History: Past Medical History  Diagnosis Date  . Hypertension   . Depression   . Complication of anesthesia     itching, sore throat  . Diabetes mellitus without complication     No history per patient, but remains under history as A1c would not be accurate given on dialysis  . Shortness of breath   . Anxiety   . ESRD (end stage renal disease)     due to HTN per patient, followed at [facility], s/p failed kidney transplant - dialysis Tue, Th, Sat    Medications:  See PTA Medication reconciliation list.   (Not in a hospital admission)  Assessment: 49 y.o male with ESRD, HD qTTS/ failed kidney transplant who presents to [facility] ED with emesis. He has a h/o diagnosis of acute PE involving the left upper lobe in October 2015 and subsequently restarted on Coumadin at that time.  INR on admit today is subtherapeutic INR at 1.45.  Pltc is 155, H/H 10.5/31, no bleeding reported.     Goal of Therapy:  INR  2-3 Heparin level 0.3-0.7 units/ml Monitor platelets by anticoagulation protocol: Yes    PLAN:  Start IV heparin drip at 950 units/hr 6 hour heparin level Daily heparin level, INR and CBC.   Nicole Cella, RPh Clinical Pharmacist Pager: [phone]  09/30/2014,11:03 PM  ADDENDUM:   Patient reports he takes coumadin 5 mg daily PTA. He did not take his coumadin on day of admission 09/30/14. He reports coumadin last taken on 09/29/14.   Plan:  Give coumadin 7.5 mg po tonight. Follow up daily INR  Nicole Cella, RPh Clinical Pharmacist Pager: [phone] 10/01/2014, 00:07 AM

## 2014-10-01 DIAGNOSIS — I319 Disease of pericardium, unspecified: Secondary | ICD-10-CM

## 2014-10-01 DIAGNOSIS — IMO0002 Reserved for concepts with insufficient information to code with codable children: Secondary | ICD-10-CM | POA: Diagnosis present

## 2014-10-01 DIAGNOSIS — I5043 Acute on chronic combined systolic (congestive) and diastolic (congestive) heart failure: Secondary | ICD-10-CM

## 2014-10-01 DIAGNOSIS — E1129 Type 2 diabetes mellitus with other diabetic kidney complication: Secondary | ICD-10-CM | POA: Diagnosis present

## 2014-10-01 DIAGNOSIS — Z9114 Patient's other noncompliance with medication regimen: Secondary | ICD-10-CM | POA: Diagnosis present

## 2014-10-01 DIAGNOSIS — F411 Generalized anxiety disorder: Secondary | ICD-10-CM

## 2014-10-01 DIAGNOSIS — Z91148 Patient's other noncompliance with medication regimen for other reason: Secondary | ICD-10-CM | POA: Diagnosis present

## 2014-10-01 DIAGNOSIS — G894 Chronic pain syndrome: Secondary | ICD-10-CM | POA: Diagnosis present

## 2014-10-01 DIAGNOSIS — F329 Major depressive disorder, single episode, unspecified: Secondary | ICD-10-CM | POA: Diagnosis present

## 2014-10-01 DIAGNOSIS — F32A Depression, unspecified: Secondary | ICD-10-CM | POA: Diagnosis present

## 2014-10-01 DIAGNOSIS — J810 Acute pulmonary edema: Secondary | ICD-10-CM | POA: Diagnosis present

## 2014-10-01 DIAGNOSIS — I3139 Other pericardial effusion (noninflammatory): Secondary | ICD-10-CM | POA: Diagnosis present

## 2014-10-01 DIAGNOSIS — I313 Pericardial effusion (noninflammatory): Secondary | ICD-10-CM | POA: Diagnosis present

## 2014-10-01 DIAGNOSIS — E1165 Type 2 diabetes mellitus with hyperglycemia: Secondary | ICD-10-CM

## 2014-10-01 LAB — HEPATITIS PANEL, ACUTE
HCV Ab: NEGATIVE
HEP B C IGM: NONREACTIVE
HEP B S AG: NEGATIVE
Hep A IgM: NONREACTIVE

## 2014-10-01 LAB — RENAL FUNCTION PANEL
Albumin: 2.6 g/dL — ABNORMAL LOW (ref 3.5–5.2)
Anion gap: 12 (ref 5–15)
BUN: 44 mg/dL — ABNORMAL HIGH (ref 6–23)
CALCIUM: 8 mg/dL — AB (ref 8.4–10.5)
CO2: 25 mmol/L (ref 19–32)
Chloride: 96 mmol/L (ref 96–112)
Creatinine, Ser: 11.95 mg/dL — ABNORMAL HIGH (ref 0.50–1.35)
GFR calc Af Amer: 5 mL/min — ABNORMAL LOW (ref >90)
GFR, EST NON AFRICAN AMERICAN: 4 mL/min — AB (ref >90)
GLUCOSE: 96 mg/dL (ref 70–99)
Phosphorus: 4.8 mg/dL — ABNORMAL HIGH (ref 2.3–4.6)
Potassium: 5.4 mmol/L — ABNORMAL HIGH (ref 3.5–5.1)
SODIUM: 133 mmol/L — AB (ref 135–145)

## 2014-10-01 LAB — CBC
HCT: 22.4 % — ABNORMAL LOW (ref 39.0–52.0)
HEMATOCRIT: 23.3 % — AB (ref 39.0–52.0)
HEMOGLOBIN: 7.5 g/dL — AB (ref 13.0–17.0)
Hemoglobin: 7.2 g/dL — ABNORMAL LOW (ref 13.0–17.0)
MCH: 25.3 pg — ABNORMAL LOW (ref 26.0–34.0)
MCH: 25.1 pg — AB (ref 26.0–34.0)
MCHC: 32.2 g/dL (ref 30.0–36.0)
MCHC: 32.1 g/dL (ref 30.0–36.0)
MCV: 78.7 fL (ref 78.0–100.0)
MCV: 78 fL (ref 78.0–100.0)
PLATELETS: 125 10*3/uL — AB (ref 150–400)
Platelets: 130 10*3/uL — ABNORMAL LOW (ref 150–400)
RBC: 2.96 MIL/uL — AB (ref 4.22–5.81)
RBC: 2.87 MIL/uL — ABNORMAL LOW (ref 4.22–5.81)
RDW: 19.1 % — ABNORMAL HIGH (ref 11.5–15.5)
RDW: 19.1 % — ABNORMAL HIGH (ref 11.5–15.5)
WBC: 5.9 10*3/uL (ref 4.0–10.5)
WBC: 5.6 10*3/uL (ref 4.0–10.5)

## 2014-10-01 LAB — HEPARIN LEVEL (UNFRACTIONATED): Heparin Unfractionated: 0.15 IU/mL — ABNORMAL LOW (ref 0.30–0.70)

## 2014-10-01 LAB — PROTIME-INR
INR: 1.76 — ABNORMAL HIGH (ref 0.00–1.49)
Prothrombin Time: 20.7 seconds — ABNORMAL HIGH (ref 11.6–15.2)

## 2014-10-01 LAB — BASIC METABOLIC PANEL
ANION GAP: 9 (ref 5–15)
BUN: 39 mg/dL — AB (ref 6–23)
CHLORIDE: 100 mmol/L (ref 96–112)
CO2: 26 mmol/L (ref 19–32)
Calcium: 8.3 mg/dL — ABNORMAL LOW (ref 8.4–10.5)
Creatinine, Ser: 11.27 mg/dL — ABNORMAL HIGH (ref 0.50–1.35)
GFR calc Af Amer: 5 mL/min — ABNORMAL LOW (ref >90)
GFR, EST NON AFRICAN AMERICAN: 5 mL/min — AB (ref >90)
Glucose, Bld: 94 mg/dL (ref 70–99)
POTASSIUM: 5 mmol/L (ref 3.5–5.1)
Sodium: 135 mmol/L (ref 135–145)

## 2014-10-01 MED ORDER — WARFARIN - PHARMACIST DOSING INPATIENT
Freq: Every day | Status: DC
  Administered 2014-10-01: 18:00:00

## 2014-10-01 MED ORDER — WARFARIN SODIUM 7.5 MG PO TABS
7.5000 mg | ORAL_TABLET | ORAL | Status: AC
  Administered 2014-10-01: 7.5 mg via ORAL
  Filled 2014-10-01: qty 1

## 2014-10-01 MED ORDER — DOXERCALCIFEROL 4 MCG/2ML IV SOLN
INTRAVENOUS | Status: AC
  Administered 2014-10-01: 2.5 ug
  Filled 2014-10-01: qty 2

## 2014-10-01 MED ORDER — ONDANSETRON HCL 4 MG/2ML IJ SOLN: 4.0000 mg | Freq: Three times a day (TID) | INTRAMUSCULAR | Status: DC | PRN

## 2014-10-01 MED ORDER — DELFLEX-LC/1.5% DEXTROSE 346 MOSM/L IP SOLN: INTRAPERITONEAL | Status: DC

## 2014-10-01 MED ORDER — ZOLPIDEM TARTRATE 5 MG PO TABS
5.0000 mg | ORAL_TABLET | Freq: Once | ORAL | Status: AC
  Administered 2014-10-01: 5 mg via ORAL
  Filled 2014-10-01: qty 1

## 2014-10-01 MED ORDER — WARFARIN SODIUM 5 MG PO TABS
5.0000 mg | ORAL_TABLET | Freq: Once | ORAL | Status: AC
  Administered 2014-10-01: 5 mg via ORAL
  Filled 2014-10-01: qty 1

## 2014-10-01 MED ORDER — ZOLPIDEM TARTRATE 5 MG PO TABS
5.0000 mg | ORAL_TABLET | Freq: Every evening | ORAL | Status: DC | PRN
  Administered 2014-10-01: 5 mg via ORAL
  Filled 2014-10-01: qty 1

## 2014-10-01 MED ORDER — OXYCODONE HCL 5 MG PO TABS
5.0000 mg | ORAL_TABLET | Freq: Four times a day (QID) | ORAL | Status: DC | PRN
  Administered 2014-10-01 (×2): 5 mg via ORAL
  Filled 2014-10-01 (×2): qty 1

## 2014-10-01 MED ORDER — DOXERCALCIFEROL 4 MCG/2ML IV SOLN
2.5000 ug | INTRAVENOUS | Status: DC
  Filled 2014-10-01: qty 2

## 2014-10-01 NOTE — Progress Notes (Signed)
ANTICOAGULATION CONSULT NOTE - [place] for Heparin Indication: h/o PE. Bridge to coumadin until INR theraputic  Allergies  Allergen Reactions  . Ferrlecit [Na Ferric Gluc Cplx In Sucrose] Shortness Of Breath, Swelling and Other (See Comments)    Swelling in throat  . Darvocet [Propoxyphene N-Acetaminophen] Hives    Patient Measurements: Height: _0  (185.4 cm) Weight: 143 lb 4.8 oz (65 kg) IBW/kg (Calculated) : 79.9 Heparin Dosing Weight: 77 kg  Vital Signs: Temp: 99.2 F (37.3 C) (03/12 1646) Temp Source: Oral (03/12 1646) BP: 143/87 mmHg (03/12 1800) Pulse Rate: 86 (03/12 1800)  Labs:  Recent Labs  09/30/14 1556 09/30/14 1602 10/01/14 0651 10/01/14 1655 10/01/14 1845  HGB 8.5* 10.5* 7.5* 7.2*  --   HCT 27.1* 31.0* 23.3* 22.4*  --   PLT 155  --  125* 130*  --   APTT 36  --   --   --   --   LABPROT 17.8*  --  20.7*  --   --   INR 1.45  --  1.76*  --   --   HEPARINUNFRC  --   --  <0.10*  --  0.15*  CREATININE  --  8.80* 11.27* 11.95*  --     Estimated Creatinine Clearance: 6.8 mL/min (by C-G formula based on Cr of 11.95).   Medications:   . heparin 1,250 Units/hr (10/01/14 1900)    Assessment: 49yo male with ESRD, HD qTTS/ failed kidney transplant who presents to [facility] ED with emesis. He has a h/o acute PE involving the left upper lobe in October 2015 and subsequently restarted on Coumadin at that time. INR on admit was subtherapeutic. Heparin level is subtherapeutic at 0.15 on 1250 units/hr. No bleeding noted.   Goal of Therapy:  Heparin level 0.3-0.7 units/ml Monitor platelets by anticoagulation protocol: Yes    PLAN:  Increase heparin drip to 1500 units/hr Check 8 hour heparin level Daily heparin level, INR, CBC, s/sx of bleeding  [facility], Pharm.D., BCPS Clinical Pharmacist Pager: [phone] 10/01/2014 7:27 PM

## 2014-10-01 NOTE — Progress Notes (Signed)
Upon admission to [facility], pt. Refuses CHG wipes, MRSA swab, and full assessment. Pt. States that he "Just wants to sleep till the morning".

## 2014-10-01 NOTE — Consult Note (Signed)
[facility] Renal Consultation Note  Indication for Consultation:  Management of ESRD/hemodialysis; anemia, hypertension/volume and secondary hyperparathyroidism  HPI: Frank [name] is a 49 y.o. male with a history of Diabetes Type 2, hypertension, PE in 04/2014 on Coumadin, depression, anxiety, and ESRD with failed renal transplant, now on dialysis at the [facility] in [facility] who presented to the ED yesterday with worsening head and chest congestion, productive cough, mild dyspnea with orthopnea, nausea, and vomiting for one week and fever and one loose stool since yesterday.  He was also noted to be hypertensive, but states he has been unable to keep medications down.  He missed dialysis on Tuesday 3/8 secondary to his symptoms and came to the ED for treatment, but did not improve.  He ran his full dialysis treatments on Wednesday and Thursday, and his chest x-ray today shows no significant edema and is similar to previous imaging.  He was hospitalized 2/9-12 for chest pain, and 2D echo showed cardiomyopathy with combined systolic/diastolic heart failure, but catheterization showed non-obstructive CAD.  He was previously on peritoneal dialysis and three weeks ago received a new PD catheter at [facility] with plans to restart next week.  Dialysis Orders:  TTS @ [facility] in [facility] 4 hrs      170 lbs (77.2 kg)     2K/2.5Ca      350/700     Heparin 3500 U, 500 U/hr    R IJ catheter   Hectorol 2.5 mcg       No Aranesp or Venofer (allergic)  Past Medical History  Diagnosis Date  . Hypertension   . Depression   . Complication of anesthesia     itching, sore throat  . Diabetes mellitus without complication     No history per patient, but remains under history as A1c would not be accurate given on dialysis  . Shortness of breath   . Anxiety   . ESRD (end stage renal disease)     due to HTN per patient, followed at [facility], s/p failed kidney  transplant - dialysis Tue, Th, Sat   Past Surgical History  Procedure Laterality Date  . Kidney receipient  2006    failed and started HD in March 2014  . Capd insertion    . Capd removal    . Left heart catheterization with coronary angiogram N/A 09/02/2014    Procedure: LEFT HEART CATHETERIZATION WITH CORONARY ANGIOGRAM;  Surgeon: [name], MD;  Location: [facility] [place] CATH LAB;  Service: Cardiovascular;  Laterality: N/A;   No family history on file.  Social History She denies any history of tobacco, alcohol, or illicit drug use.  Allergies  Allergen Reactions  . Ferrlecit [Na Ferric Gluc Cplx In Sucrose] Shortness Of Breath, Swelling and Other (See Comments)    Swelling in throat  . Darvocet [Propoxyphene N-Acetaminophen] Hives   Prior to Admission medications   Medication Sig Start Date End Date Taking? Authorizing Provider  ALPRAZolam (XANAX) 0.25 MG tablet Take 0.25 mg by mouth daily as needed. For sleep 06/25/14  Yes Historical Provider, MD  cloNIDine (CATAPRES - DOSED IN MG/24 HR) 0.3 mg/24hr patch Place 1 patch onto the skin. Every 7 days 08/08/14  Yes Historical Provider, MD  enoxaparin (LOVENOX) 80 MG/0.8ML injection Inject into the skin. 09/02/14  Yes Historical Provider, MD  Ferric Citrate 210 MG TABS Take 210 mg by mouth 3 (three) times daily with meals. Take 1 with each meal and 1 with  a snack. 08/08/14  Yes Historical Provider, MD  gentamicin cream (GARAMYCIN) 0.1 % Apply to [place] site 06/13/14  Yes Historical Provider, MD  hydrocortisone 1 % lotion Apply to site with itch as needed twice daily 06/13/14  Yes Historical Provider, MD  nabumetone (RELAFEN) 500 MG tablet Take 500 mg by mouth 2 (two) times daily. 09/12/14 11/11/14 Yes Historical Provider, MD  oxyCODONE (ROXICODONE) 15 MG immediate release tablet Take 15 mg by mouth. 07/14/14  Yes Historical Provider, MD  senna-docusate (SENOKOT-S) 8.6-50 MG per tablet Take 1 tablet by mouth 2 (two) times daily. 05/29/14  Yes  Historical Provider, MD  tacrolimus (PROGRAF) 1 MG capsule Take 2 mg by mouth 2 (two) times daily. 07/14/14  Yes Historical Provider, MD  warfarin (COUMADIN) 5 MG tablet Take 5 mg tonight, then 2.5 mg daily or as directed by physician based on INR 08/25/14  Yes Historical Provider, MD  acetaminophen (TYLENOL) 325 MG tablet Take 2 tablets (650 mg total) by mouth every 4 (four) hours as needed for mild pain, fever or headache. Patient not taking: Reported on 09/19/2014 04/06/14   Delfina Redwood, MD  allopurinol (ZYLOPRIM) 100 MG tablet Take 100 mg by mouth daily.    Historical Provider, MD  amLODipine (NORVASC) 5 MG tablet Take 10 mg by mouth daily.    Historical Provider, MD  atorvastatin (LIPITOR) 40 MG tablet Take 1 tablet (40 mg total) by mouth daily at 6 PM. Patient not taking: Reported on 09/19/2014 09/02/14   Barton Dubois, MD  carvedilol (COREG) 25 MG tablet Take 25 mg by mouth 2 (two) times daily with a meal.    Historical Provider, MD  cinacalcet (SENSIPAR) 30 MG tablet Take 30 mg by mouth daily.    Historical Provider, MD  hydrALAZINE (APRESOLINE) 25 MG tablet Take 100 mg by mouth 3 (three) times daily.     Historical Provider, MD  methocarbamol (ROBAXIN-750) 750 MG tablet Take 1 tablet (750 mg total) by mouth every 6 (six) hours as needed for muscle spasms. 09/19/14   Linton Flemings, MD  minoxidil (LONITEN) 2.5 MG tablet Take 1 tablet (2.5 mg total) by mouth daily. Patient taking differently: Take 5 mg by mouth daily.  04/06/14   Delfina Redwood, MD  NIFEdipine (PROCARDIA-XL/ADALAT CC) 30 MG 24 hr tablet Take 1 tablet (30 mg total) by mouth daily. Patient not taking: Reported on 09/19/2014 04/06/14   Delfina Redwood, MD  omeprazole (PRILOSEC) 20 MG capsule Take 20 mg by mouth daily. 07/01/13   Historical Provider, MD  ondansetron (ZOFRAN-ODT) 8 MG disintegrating tablet Take 1 tablet (8 mg total) by mouth every 8 (eight) hours as needed for nausea or vomiting. 09/28/14   Linton Flemings, MD   oxyCODONE (OXY IR/ROXICODONE) 5 MG immediate release tablet Take 1-2 tablets (5-10 mg total) by mouth every 6 (six) hours as needed for severe pain. Patient not taking: Reported on 09/19/2014 09/02/14   Barton Dubois, MD  polyethylene glycol [facility]) packet Take 17 g by mouth daily. For constipation Patient taking differently: Take 17 g by mouth daily as needed for mild constipation. For constipation 05/12/14   Domenic Polite, MD  predniSONE (DELTASONE) 10 MG tablet Take 10 mg by mouth daily with breakfast.    Historical Provider, MD  sevelamer carbonate (RENVELA) 800 MG tablet Take 1,600 mg by mouth 3 (three) times daily with meals. 12/27/13 12/27/14  Historical Provider, MD  sulfamethoxazole-trimethoprim (BACTRIM,SEPTRA) 400-80 MG per tablet Take 1 tablet by mouth every Monday, Wednesday, and  Friday.    Historical Provider, MD  traMADol (ULTRAM) 50 MG tablet Take 2 tablets (100 mg total) by mouth every 6 (six) hours as needed. Patient not taking: Reported on 09/14/2014 05/05/14   Rolland Porter, MD  warfarin (COUMADIN) 5 MG tablet Take 1.5 tablets (7.5 mg total) by mouth one time only at 6 PM. Take 44m daily at 6pm Patient taking differently: Take 5 mg by mouth daily.  09/02/14   CBarton Dubois MD   Labs:  Results for orders placed or performed during the hospital encounter of 09/30/14 (from the past 48 hour(s))  CBC with Differential     Status: Abnormal   Collection Time: 09/30/14  3:56 PM  Result Value Ref Range   WBC 6.4 4.0 - 10.5 K/uL   RBC 3.40 (L) 4.22 - 5.81 MIL/uL   Hemoglobin 8.5 (L) 13.0 - 17.0 g/dL   HCT 27.1 (L) 39.0 - 52.0 %   MCV 79.7 78.0 - 100.0 fL   MCH 25.0 (L) 26.0 - 34.0 pg   MCHC 31.4 30.0 - 36.0 g/dL   RDW 19.2 (H) 11.5 - 15.5 %   Platelets 155 150 - 400 K/uL   Neutrophils Relative % 72 43 - 77 %   Neutro Abs 4.7 1.7 - 7.7 K/uL   Lymphocytes Relative 17 12 - 46 %   Lymphs Abs 1.1 0.7 - 4.0 K/uL   Monocytes Relative 10 3 - 12 %   Monocytes Absolute 0.6 0.1 - 1.0 K/uL    Eosinophils Relative 1 0 - 5 %   Eosinophils Absolute 0.1 0.0 - 0.7 K/uL   Basophils Relative 0 0 - 1 %   Basophils Absolute 0.0 0.0 - 0.1 K/uL  Brain natriuretic peptide     Status: Abnormal   Collection Time: 09/30/14  3:56 PM  Result Value Ref Range   B Natriuretic Peptide >4500.0 (H) 0.0 - 100.0 pg/mL  Protime-INR     Status: Abnormal   Collection Time: 09/30/14  3:56 PM  Result Value Ref Range   Prothrombin Time 17.8 (H) 11.6 - 15.2 seconds   INR 1.45 0.00 - 1.49  APTT     Status: None   Collection Time: 09/30/14  3:56 PM  Result Value Ref Range   aPTT 36 24 - 37 seconds  [facility] Troponin[facility]     Status: None   Collection Time: 09/30/14  4:01 PM  Result Value Ref Range   Troponin i, poc 0.06 0.00 - 0.08 ng/mL   Comment 3            Comment: Due to the release kinetics of cTnI, a negative result within the first hours of the onset of symptoms does not rule out myocardial infarction with certainty. If myocardial infarction is still suspected, repeat the test at appropriate intervals.   I-Stat Chem 8, ED     Status: Abnormal   Collection Time: 09/30/14  4:02 PM  Result Value Ref Range   Sodium 136 135 - 145 mmol/L   Potassium 4.7 3.5 - 5.1 mmol/L   Chloride 99 96 - 112 mmol/L   BUN 31 (H) 6 - 23 mg/dL   Creatinine, Ser 8.80 (H) 0.50 - 1.35 mg/dL   Glucose, Bld 97 70 - 99 mg/dL   Calcium, Ion 1.07 (L) 1.12 - 1.23 mmol/L   TCO2 21 0 - 100 mmol/L   Hemoglobin 10.5 (L) 13.0 - 17.0 g/dL   HCT 31.0 (L) 39.0 - 52.0 %  CBC  Status: Abnormal   Collection Time: 10/01/14  6:51 AM  Result Value Ref Range   WBC 5.9 4.0 - 10.5 K/uL   RBC 2.96 (L) 4.22 - 5.81 MIL/uL   Hemoglobin 7.5 (L) 13.0 - 17.0 g/dL    Comment: REPEATED TO VERIFY   HCT 23.3 (L) 39.0 - 52.0 %   MCV 78.7 78.0 - 100.0 fL   MCH 25.3 (L) 26.0 - 34.0 pg   MCHC 32.2 30.0 - 36.0 g/dL   RDW 19.1 (H) 11.5 - 15.5 %   Platelets 125 (L) 150 - 400 K/uL  Basic metabolic panel     Status: Abnormal    Collection Time: 10/01/14  6:51 AM  Result Value Ref Range   Sodium 135 135 - 145 mmol/L   Potassium 5.0 3.5 - 5.1 mmol/L   Chloride 100 96 - 112 mmol/L   CO2 26 19 - 32 mmol/L   Glucose, Bld 94 70 - 99 mg/dL   BUN 39 (H) 6 - 23 mg/dL   Creatinine, Ser 11.27 (H) 0.50 - 1.35 mg/dL   Calcium 8.3 (L) 8.4 - 10.5 mg/dL   GFR calc non Af Amer 5 (L) >90 mL/min   GFR calc Af Amer 5 (L) >90 mL/min    Comment: (NOTE) The eGFR has been calculated using the CKD EPI equation. This calculation has not been validated in all clinical situations. eGFR's persistently <90 mL/min signify possible Chronic Kidney Disease.    Anion gap 9 5 - 15  Protime-INR     Status: Abnormal   Collection Time: 10/01/14  6:51 AM  Result Value Ref Range   Prothrombin Time 20.7 (H) 11.6 - 15.2 seconds   INR 1.76 (H) 0.00 - 1.49  Heparin level (unfractionated)     Status: Abnormal   Collection Time: 10/01/14  6:51 AM  Result Value Ref Range   Heparin Unfractionated <0.10 (L) 0.30 - 0.70 IU/mL    Comment:        IF HEPARIN RESULTS ARE BELOW EXPECTED VALUES, AND PATIENT DOSAGE HAS BEEN CONFIRMED, SUGGEST FOLLOW UP TESTING OF ANTITHROMBIN III LEVELS. REPEATED TO VERIFY    Constitutional: positive for fatigue and fevers, negative for chills and sweats Ears, nose, mouth, throat, and face: positive for nasal congestion, negative for earaches, hoarseness and sore throat Respiratory: positive for cough, dyspnea on exertion and sputum, negative for hemoptysis Cardiovascular: positive for chest pressure/discomfort, dyspnea and orthopnea, negative for chest pain and palpitations Gastrointestinal: positive for diarrhea, nausea and vomiting, negative for abdominal pain Genitourinary:negative, oliguric Musculoskeletal:negative for arthralgias, back pain, myalgias and neck pain Neurological: positive for weakness, negative for dizziness, headaches and paresthesia  Physical Exam: Filed Vitals:   10/01/14 1153  BP: 158/113   Pulse: 75  Temp: 99.4 F (37.4 C)  Resp: 26     General appearance: alert, cooperative and no distress Head: Normocephalic, without obvious abnormality, atraumatic Neck: no adenopathy, no carotid bruit, no JVD and supple, symmetrical, trachea midline Resp: scattered wheezes, mild crackles bilaterally Cardio: regular rate and rhythm, S1, S2 normal, no murmur, click, rub or gallop GI: soft, non-tender; bowel sounds normal; no masses,  no organomegaly Extremities: extremities normal, atraumatic, no cyanosis or edema Neurologic: Grossly normal Dialysis Access: R IJ catheter    Assessment/Plan: 1. Nausea / Vomiting / Cough - no significant fluid overload, checking respiratory virus panel, started PO Bactrim. 2. ESRD - HD on TTS @ [facility], K 5, no recent missed or shortened Txs.  HD pending. 3. Hypertension/volume -  BP initially elevated (207/119), now 143/87, on Amlodipine 10 mg qd, Carvedilol 25 mg bid, Hydralazine 100 mg tid, Nifedipine 30 mg qd, Minoxidil 5 mg qd; wt 66.8 kg, below EDW. 4. Anemia - Hgb down to 7.5, 10.5 yesterday, no meds. Recheck CBC. 5. Metabolic bone disease - Ca 8.3, Hectorol 2.5 mcg, Renvela 2 with meals (1 with snacks). 6. Nutrition - renal diet 7. Hx PE - 04/2014, on Coumadin. 8. DM Type 2 - no meds, last Hgb A1C 7.5.  LYLES,CHARLES 10/01/2014, 1:35 PM   Attending Nephrologist:  Roney Jaffe, MD  Pt seen, examined and agree w A/P as above. 49 yo with ESRD , HTN, DM on HD in [place].  Here w cough, n/v/d.  Fevers. On exam has URI w congestion, wheezing bilat lung bases, no LE or UE edema or JVD.  He is at his dry wt.  His BP does not correlate with vol status , i.e. High BP does not mean vol overload. CXR's reviewed. Can't say for sure but suspect this is mostly infection causing wheezing, coughing.  Prob has the flu.  Will do HD today, small UF attempt.  Will follow.   Kelly Splinter MD pager [phone]    cell [phone] 10/01/2014, 3:24 PM

## 2014-10-01 NOTE — Progress Notes (Signed)
ANTICOAGULATION CONSULT NOTE - [facility] for Heparin and coumadin Indication: h/o PE. Bridge to coumadin until INR theraputic  Allergies  Allergen Reactions  . Ferrlecit [Na Ferric Gluc Cplx In Sucrose] Shortness Of Breath, Swelling and Other (See Comments)    Swelling in throat  . Darvocet [Propoxyphene N-Acetaminophen] Hives    Patient Measurements: Height: 6' 1" (185.4 cm) Weight: 171 lb 8.3 oz (77.8 kg) IBW/kg (Calculated) : 79.9 Heparin Dosing Weight: 77 kg  Vital Signs: Temp: 99.6 F (37.6 C) (03/12 0755) Temp Source: Oral (03/12 0755) BP: 170/128 mmHg (03/12 0755) Pulse Rate: 82 (03/12 0755)  Labs:  Recent Labs  09/30/14 1556 09/30/14 1602 10/01/14 0651  HGB 8.5* 10.5* 7.5*  HCT 27.1* 31.0* 23.3*  PLT 155  --  125*  APTT 36  --   --   LABPROT 17.8*  --  20.7*  INR 1.45  --  1.76*  HEPARINUNFRC  --   --  <0.10*  CREATININE  --  8.80* 11.27*    Estimated Creatinine Clearance: 8.6 mL/min (by C-G formula based on Cr of 11.27).   Medical History: Past Medical History  Diagnosis Date  . Hypertension   . Depression   . Complication of anesthesia     itching, sore throat  . Diabetes mellitus without complication     No history per patient, but remains under history as A1c would not be accurate given on dialysis  . Shortness of breath   . Anxiety   . ESRD (end stage renal disease)     due to HTN per patient, followed at [facility], s/p failed kidney transplant - dialysis Tue, Th, Sat    Medications:  See PTA Medication reconciliation list.  Prescriptions prior to admission  Medication Sig Dispense Refill Last Dose  . acetaminophen (TYLENOL) 325 MG tablet Take 2 tablets (650 mg total) by mouth every 4 (four) hours as needed for mild pain, fever or headache. (Patient not taking: Reported on 09/19/2014)   Not Taking at Unknown time  . allopurinol (ZYLOPRIM) 100 MG tablet Take 100 mg by mouth daily.   09/18/2014 at Unknown time  .  amLODipine (NORVASC) 5 MG tablet Take 10 mg by mouth daily.   09/18/2014 at Unknown time  . atorvastatin (LIPITOR) 40 MG tablet Take 1 tablet (40 mg total) by mouth daily at 6 PM. (Patient not taking: Reported on 09/19/2014) 30 tablet 1 Not Taking at Unknown time  . carvedilol (COREG) 25 MG tablet Take 25 mg by mouth 2 (two) times daily with a meal.   09/18/2014 at 5pm  . cinacalcet (SENSIPAR) 30 MG tablet Take 30 mg by mouth daily.   09/18/2014 at Unknown time  . hydrALAZINE (APRESOLINE) 25 MG tablet Take 100 mg by mouth 3 (three) times daily.    09/18/2014 at Unknown time  . methocarbamol (ROBAXIN-750) 750 MG tablet Take 1 tablet (750 mg total) by mouth every 6 (six) hours as needed for muscle spasms. 40 tablet 0   . minoxidil (LONITEN) 2.5 MG tablet Take 1 tablet (2.5 mg total) by mouth daily. (Patient taking differently: Take 5 mg by mouth daily. ) 30 tablet 0 09/18/2014 at Unknown time  . NIFEdipine (PROCARDIA-XL/ADALAT CC) 30 MG 24 hr tablet Take 1 tablet (30 mg total) by mouth daily. (Patient not taking: Reported on 09/19/2014) 30 tablet 0 Not Taking at Unknown time  . omeprazole (PRILOSEC) 20 MG capsule Take 20 mg by mouth daily.   09/18/2014 at Unknown time  .  ondansetron (ZOFRAN-ODT) 8 MG disintegrating tablet Take 1 tablet (8 mg total) by mouth every 8 (eight) hours as needed for nausea or vomiting. 20 tablet 0   . oxyCODONE (OXY IR/ROXICODONE) 5 MG immediate release tablet Take 1-2 tablets (5-10 mg total) by mouth every 6 (six) hours as needed for severe pain. (Patient not taking: Reported on 09/19/2014) 45 tablet 0 Not Taking at Unknown time  . polyethylene glycol (MIRALAX) packet Take 17 g by mouth daily. For constipation (Patient taking differently: Take 17 g by mouth daily as needed for mild constipation. For constipation) 14 each 0 Past Month at Unknown time  . predniSONE (DELTASONE) 10 MG tablet Take 10 mg by mouth daily with breakfast.   09/18/2014 at Unknown time  . sevelamer carbonate  (RENVELA) 800 MG tablet Take 1,600 mg by mouth 3 (three) times daily with meals.   09/18/2014 at Unknown time  . sulfamethoxazole-trimethoprim (BACTRIM,SEPTRA) 400-80 MG per tablet Take 1 tablet by mouth every Monday, Wednesday, and Friday.   Past Week at Unknown time  . traMADol (ULTRAM) 50 MG tablet Take 2 tablets (100 mg total) by mouth every 6 (six) hours as needed. (Patient not taking: Reported on 09/14/2014) 16 tablet 0 Completed Course at Unknown time  . warfarin (COUMADIN) 5 MG tablet Take 1.5 tablets (7.5 mg total) by mouth one time only at 6 PM. Take 84m daily at 6pm (Patient taking differently: Take 5 mg by mouth daily. ) 30 tablet 0 09/18/2014 at Unknown time    Assessment: 89yomale with ESRD, HD qTTS/ [facility]. He has a h/o acute PE involving the left upper lobe in October 2015 and subsequently restarted on Coumadin at that time.  INR on admit subtherapeutic INR at 1.45.  Initial heparin level is low (<0.1), INR trending up, now 1.76.  CBC low, no bleeding reported.  Pt reports home warfarin regimen 529mdaily   Goal of Therapy:  INR 2-3 Heparin level 0.3-0.7 units/ml Monitor platelets by anticoagulation protocol: Yes    PLAN:  Increase heparin drip to 1250 units/hr Check 8 hour heparin level Warfarin [id number] today Daily heparin level, INR, CBC, s/sx of bleeding  TegDrucie OpitzharmD Clinical Pharmacy Resident Pager: [phone]/2016 8:26 AM

## 2014-10-01 NOTE — Progress Notes (Signed)
[place] TEAM 1 - Stepdown/ICU TEAM Progress Note  Frank [name] WJX:[id number] DOB: 1966-07-01 DOA: 09/30/2014 PCP: [name], NP  Admit HPI / Brief Narrative: Frank [name] is a 49 y.o. BM PMHx anxiety, depression, noncompliance, diabetes type 2 uncontrolled, combined systolic and diastolic CHF, ESRD, dialysis T/T/S. chronic pain syndrome, Recently seen 2 days ago after missing dialysis and developing acute CHF as a result with fluid overload. Patient presents to the ED with SOB, DOE, orthopnea. Today he developed N/V he denies fever subjectively (objectively has T of 100.5 in ED). The N/V isnt very severe, but it did mean that he couldn't keep his BP meds down at home and as a result his BPs here in the ED have been running from 213Y to 865H systolic with diastolics from 846N and up.  HPI/Subjective: 3/12  A/O 4, dry weight 168 pounds, states weighs himself daily. Positive productive cough (clear sputum), increase SOB. States has not missed his last 2 HD sessions.  Assessment/Plan: ESRD on HD T/Th/S -Patient will have hemodialysis session today. -Nephrology consult  Combined systolic and diastolic CHF -Patient has been to his last 2 HD sessions, however still presented with pulmonary edema and SOB. Most likely multifactorial to include ESRD, worsening CHF, increase pericardial effusion? -Obtain echocardiogram -Continue amlodipine 10 mg daily -Continue Coreg 25 mg BID -Continue clonidine 0.5 mg daily -Continue nifedipine 30 mg daily -Strict in and out; since admission + 158m -Daily weight; admission weight= 77.8 kg (171.5 pounds)  Hypertensive urgency -See CHF  Pulmonary edema  -See CHF  Pericardial effusion -See CHF  Diabetes type 2 uncontrolled -3/12 hemoglobin A1c= 7.5  DVT? -A shunt with questionable history of previous DVT -Extremity Doppler on 05/12/14, upper extremity Doppler 09/14/14 both negative for DVT -Will continue patient on heparin drip until he can  be determined definitively why patient on anticoagulation  Anxiety/depression -Currently not on medication  Chronic pain syndrome (cause?) -At home Patient on oxycodone 15 mg, Robaxin 750 QID PRN   Noncompliance with medication regimen   Infection ? -Obtain HIV, acute hepatitis panel   Code Status: FULL Family Communication: no family present at time of exam Disposition Plan: Nephrology    Consultants:   Procedure/Significant Events: 2/12 echocardiogram; Left ventricle: mildly to moderately reduced.-LVEF 40% to 45%.Diffuse hypokinesis. (grade 1 diastolicdysfunction). - Pulmonary arteries: PA peak pressure: 31 mm Hg (S). - Pericardium, extracardiac: A small, free-flowing pericardial effusion was identified circumferential to the heart. No evidence of hemodynamic compromise.   Culture NA  Antibiotics: NA  DVT prophylaxis: Heparin infusion   Devices    LINES / TUBES:      Continuous Infusions: . heparin 1,250 Units/hr (10/01/14 0900)    Objective: VITAL SIGNS: Temp: 99.4 F (37.4 C) (03/12 1153) Temp Source: Oral (03/12 1153) BP: 158/113 mmHg (03/12 1153) Pulse Rate: 75 (03/12 1153) SPO2; FIO2:   Intake/Output Summary (Last 24 hours) at 10/01/14 1304 Last data filed at 10/01/14 0900  Gross per 24 hour  Intake 195.62 ml  Output      0 ml  Net 195.62 ml     Exam: General: Belligerent attitude, A/O 4, productive cough (clear sputum), No acute respiratory distress Lungs: tachypnea, diffuse rhonchi, without wheezes or crackles Cardiovascular: Regular rate and rhythm without murmur gallop or rub normal S1 and S2 Abdomen: Nontender, nondistended, soft, bowel sounds positive, no rebound, no ascites, no appreciable mass Extremities: No significant cyanosis, clubbing, or edema bilateral lower extremities  Data Reviewed: Basic Metabolic Panel:  Recent  Labs Lab 09/28/14 0340 09/30/14 1602 10/01/14 0651  NA 137 136 135  K 4.7 4.7 5.0  CL 100  99 100  CO2 23  --  26  GLUCOSE 85 97 94  BUN 73* 31* 39*  CREATININE 15.97* 8.80* 11.27*  CALCIUM 8.7  --  8.3*   Liver Function Tests:  Recent Labs Lab 09/28/14 0340  AST 46*  ALT 20  ALKPHOS 99  BILITOT 0.8  PROT 7.2  ALBUMIN 3.1*   No results for input(s): LIPASE, AMYLASE in the last 168 hours. No results for input(s): AMMONIA in the last 168 hours. CBC:  Recent Labs Lab 09/28/14 0340 09/30/14 1556 09/30/14 1602 10/01/14 0651  WBC 6.4 6.4  --  5.9  NEUTROABS 4.3 4.7  --   --   HGB 8.5* 8.5* 10.5* 7.5*  HCT 26.5* 27.1* 31.0* 23.3*  MCV 77.0* 79.7  --  78.7  PLT 149* 155  --  125*   Cardiac Enzymes:  Recent Labs Lab 09/28/14 0340  TROPONINI 0.07*   BNP (last 3 results)  Recent Labs  08/30/14 0103 09/12/14 0251 09/30/14 1556  BNP 1924.6* >4500.0* >4500.0*    ProBNP (last 3 results)  Recent Labs  01/02/14 0317 01/18/14 0048 04/15/14 0809  PROBNP >70000.0* >70000.0* >70000.0*    CBG: No results for input(s): GLUCAP in the last 168 hours.  No results found for this or any previous visit (from the past 240 hour(s)).   Studies:  Recent x-ray studies have been reviewed in detail by the Attending Physician  Scheduled Meds:  Scheduled Meds: . allopurinol  100 mg Oral Daily  . amLODipine  10 mg Oral Daily  . atorvastatin  40 mg Oral q1800  . carvedilol  25 mg Oral BID WC  . cinacalcet  30 mg Oral Q breakfast  . doxercalciferol  2.5 mcg Intravenous Q T,Th,Sa-HD  . hydrALAZINE  100 mg Oral TID  . minoxidil  5 mg Oral Daily  . NIFEdipine  30 mg Oral Daily  . pantoprazole  40 mg Oral Daily  . predniSONE  10 mg Oral Q breakfast  . sevelamer carbonate  1,600 mg Oral TID WC  . sodium chloride  3 mL Intravenous Q12H  . [START ON 10/03/2014] sulfamethoxazole-trimethoprim  1 tablet Oral Q M,W,F  . warfarin  5 mg Oral ONCE-1800  . Warfarin - Pharmacist Dosing Inpatient   Does not apply q1800    Time spent on care of this patient: 40  mins   WOODS, Geraldo Docker , MD  [facility] Office  [phone] Pager - [phone]  On-Call/Text Page:      [email or web address]      password [email or web address]  If 7PM-7AM, please contact night-coverage [email or web address] Password [email or web address] 10/01/2014, 1:04 PM   LOS: 1 day   Care during the described time interval was provided by me .  I have reviewed this patient's available data, including medical history, events of note, physical examination, radiology studies and test results as part of my evaluation  Dia Crawford, MD [phone] Pager

## 2014-10-02 DIAGNOSIS — I429 Cardiomyopathy, unspecified: Secondary | ICD-10-CM

## 2014-10-02 DIAGNOSIS — J811 Chronic pulmonary edema: Secondary | ICD-10-CM

## 2014-10-02 DIAGNOSIS — I2699 Other pulmonary embolism without acute cor pulmonale: Secondary | ICD-10-CM

## 2014-10-02 DIAGNOSIS — R0600 Dyspnea, unspecified: Secondary | ICD-10-CM

## 2014-10-02 LAB — HEPATITIS B SURFACE ANTIGEN: Hepatitis B Surface Ag: NEGATIVE

## 2014-10-02 LAB — CBC
HCT: 22.8 % — ABNORMAL LOW (ref 39.0–52.0)
Hemoglobin: 7.3 g/dL — ABNORMAL LOW (ref 13.0–17.0)
MCH: 24.9 pg — ABNORMAL LOW (ref 26.0–34.0)
MCHC: 32 g/dL (ref 30.0–36.0)
MCV: 77.8 fL — AB (ref 78.0–100.0)
PLATELETS: 129 10*3/uL — AB (ref 150–400)
RBC: 2.93 MIL/uL — AB (ref 4.22–5.81)
RDW: 19 % — AB (ref 11.5–15.5)
WBC: 5.1 10*3/uL (ref 4.0–10.5)

## 2014-10-02 LAB — PROTIME-INR
INR: 2.14 — ABNORMAL HIGH (ref 0.00–1.49)
Prothrombin Time: 24.1 seconds — ABNORMAL HIGH (ref 11.6–15.2)

## 2014-10-02 LAB — HEPARIN LEVEL (UNFRACTIONATED): Heparin Unfractionated: 0.19 IU/mL — ABNORMAL LOW (ref 0.30–0.70)

## 2014-10-02 MED ORDER — WARFARIN SODIUM 2.5 MG PO TABS
2.5000 mg | ORAL_TABLET | Freq: Once | ORAL | Status: DC
  Filled 2014-10-02: qty 1

## 2014-10-02 MED ORDER — LEVALBUTEROL HCL 0.63 MG/3ML IN NEBU: 0.6300 mg | INHALATION_SOLUTION | Freq: Four times a day (QID) | RESPIRATORY_TRACT | Status: DC | PRN

## 2014-10-02 MED ORDER — METFORMIN HCL 500 MG PO TABS: 500.0000 mg | ORAL_TABLET | Freq: Two times a day (BID) | ORAL | Status: DC

## 2014-10-02 MED ORDER — LEVALBUTEROL TARTRATE 45 MCG/ACT IN AERO: 2.0000 | INHALATION_SPRAY | RESPIRATORY_TRACT | Status: DC | PRN

## 2014-10-02 MED ORDER — LIVING WELL WITH DIABETES BOOK
Freq: Once | Status: DC
  Filled 2014-10-02: qty 1

## 2014-10-02 MED ORDER — IPRATROPIUM-ALBUTEROL 0.5-2.5 (3) MG/3ML IN SOLN
3.0000 mL | Freq: Once | RESPIRATORY_TRACT | Status: AC
  Administered 2014-10-02: 3 mL via RESPIRATORY_TRACT
  Filled 2014-10-02: qty 3

## 2014-10-02 MED ORDER — LEVALBUTEROL TARTRATE 45 MCG/ACT IN AERO: 2.0000 | INHALATION_SPRAY | Freq: Four times a day (QID) | RESPIRATORY_TRACT | Status: DC | PRN

## 2014-10-02 NOTE — Progress Notes (Signed)
[facility] Progress Note   Subjective: feeling a little better, still congested some in nose/ chest. SOB better, +cough. Couldn't tolerate nasal swab for virus panel  Filed Vitals:   10/02/14 0400 10/02/14 0700 10/02/14 0846 10/02/14 1244  BP: 145/96 146/92 149/92 121/75  Pulse: 81 73  73  Temp:   98.5 F (36.9 C) 98.9 F (37.2 C)  TempSrc:   Oral Oral  Resp: _0 Height:      Weight:      SpO2: 92% 97% 93% 93%   Exam: Alert, no distress, nasal congestion No jvd Chest scattered exp wheezes and some rhonchi, good air movement, no ^WOB RRR no MRG Abd soft, flat, NTND No LE or UE edema R IJ catheter Neuro is nf, ox 3  HD: TTS @ [facility] in [place] [facility] 4 hrs 170 lbs (77.2 kg) 2K/2.5Ca 350/700 Heparin 3500 U, 500 U/hr R IJ catheter  Hectorol 2.5 mcg No Aranesp or Venofer (allergic)       Assessment: 1. Cough/ fever/ wheezing/ n/v - probable flu vs bronchitis/URI, could not tolerate nasal swab for lab testing. Improving, still wheezing some, consider nebs. On po Bactrim 2. ESRD HD TTS in [place] 3. Volume - CXR has chronic hypervascular picture, unchanged from prior and he is at is dry wt ; not vol overloaded 4. Cardiomyopathy - this is prob from chronic severe HTN.  EF 30-35%. Per primary 5. Anemia Hb up after transfusion 6. MBD cont meds 7. Hx PE on coumadin 8. DM2, not on meds  Plan- HD Tuesday if still here    Kelly Splinter MD  pager [phone]    cell [phone]  10/02/2014, 12:49 PM     Recent Labs Lab 09/28/14 0340 09/30/14 1602 10/01/14 0651 10/01/14 1655  NA 137 136 135 133*  K 4.7 4.7 5.0 5.4*  CL 100 99 100 96  CO2 23  --  26 25  GLUCOSE 85 97 94 96  BUN 73* 31* 39* 44*  CREATININE 15.97* 8.80* 11.27* 11.95*  CALCIUM 8.7  --  8.3* 8.0*  PHOS  --   --   --  4.8*    Recent Labs Lab 09/28/14 0340 10/01/14 1655  AST 46*  --   ALT 20  --   ALKPHOS 99  --   BILITOT 0.8  --   PROT  7.2  --   ALBUMIN 3.1* 2.6*    Recent Labs Lab 09/28/14 0340 09/30/14 1556  10/01/14 0651 10/01/14 1655 10/02/14 0305  WBC 6.4 6.4  --  5.9 5.6 5.1  NEUTROABS 4.3 4.7  --   --   --   --   HGB 8.5* 8.5*  < > 7.5* 7.2* 7.3*  HCT 26.5* 27.1*  < > 23.3* 22.4* 22.8*  MCV 77.0* 79.7  --  78.7 78.0 77.8*  PLT 149* 155  --  125* 130* 129*  < > = values in this interval not displayed. [name] allopurinol  100 mg Oral Daily  . amLODipine  10 mg Oral Daily  . atorvastatin  40 mg Oral q1800  . carvedilol  25 mg Oral BID WC  . cinacalcet  30 mg Oral Q breakfast  . dialysis solution 1.5% low-MG/low-CA   Intraperitoneal Weekly  . doxercalciferol  2.5 mcg Intravenous Q T,Th,Sa-HD  . hydrALAZINE  100 mg Oral TID  . minoxidil  5 mg Oral Daily  . NIFEdipine  30 mg Oral Daily  . pantoprazole  40 mg Oral Daily  . predniSONE  10 mg Oral Q breakfast  . sevelamer carbonate  1,600 mg Oral TID WC  . sodium chloride  3 mL Intravenous Q12H  . [START ON 10/03/2014] sulfamethoxazole-trimethoprim  1 tablet Oral Q M,W,F  . warfarin  2.5 mg Oral ONCE-1800  . Warfarin - Pharmacist Dosing Inpatient   Does not apply q1800     acetaminophen **OR** acetaminophen, labetalol, ondansetron (ZOFRAN) IV, oxyCODONE, polyethylene glycol, traMADol, zolpidem

## 2014-10-02 NOTE — Progress Notes (Signed)
  Echocardiogram 2D Echocardiogram has been performed.  Frank [name] 10/02/2014, 10:51 AM

## 2014-10-02 NOTE — Discharge Summary (Addendum)
Physician Discharge Summary  Frank [name] YTK:[id number] DOB: [birth year] DOA: 09/30/2014  PCP: Frank Manning, NP  Admit date: 09/30/2014 Discharge date: 10/02/2014  Time spent: 40 minutes  Recommendations for Outpatient Follow-up:  ESRD on HD T/Th/S -Patient will have hemodialysis session today. -Follow-up with regular dialysis center on regular days area   Combined systolic and diastolic CHF/nonischemic cardiomyopathy -Seen on 2/12 by Dr. Glenetta Hew (cardiology); Myoview stress test showed nonischemic cardiomyopathy -Patient has been to his last 2 HD sessions, however still presented with pulmonary edema and SOB. Most likely multifactorial to include ESRD, and worsening CHF/pericardial effusion -Echocardiogram; confirmed worsening heart failure see result below -Continue amlodipine 10 mg daily -Continue Coreg 25 mg BID -Continue clonidine 0.5 mg daily -Continue nifedipine 30 mg daily -Strict in and out; since admission + 144m -Daily weight; admission weight= 77.8 kg (171.5 pounds)        postdialysis weight= 65 kg (143.3 pounds) -Patient to weigh himself on a scale as soon as he arrives home today, and weigh daily and maintain a logbook of his weights. -Hemodynamically stable -[facility] to contact patient on Monday for follow-up to CHF clinic  Hypertensive urgency -See CHF  Pericardial effusion -See CHF  Chronic Pulmonary edema  -See CHF  PE -A shunt with questionable history of previous DVT -Extremity Doppler on 05/12/14, upper extremity Doppler 09/14/14 both negative for DVT -Patient verifies that he had a small PE~6 months ago -Continue home regimen of warfarin.  -Follow-up with PCP and discuss D/Cing warfarin  Diabetes type 2 uncontrolled -3/12 hemoglobin A1c= 7.5  -Patient now on medication at admission.   -Follow-up with PCP to titrate medication recommend starting on Lantus  Anxiety/depression -Currently not on medication  Chronic pain syndrome  -At home Patient  on oxycodone 15 mg, Robaxin 750 QID PRN   Noncompliance with medication regimen -Discussed at length need for patient to take cardiac medication, in the diabetic medication in order to prevent further degradation of cardiac function or death  Infection ? -Acute hepatitis panel negative -Respiratory virus panel patient refused swab -Influenza panel patient refused swab    Discharge Diagnoses:  Principal Problem:   Hypertensive urgency Active Problems:   ESRD on dialysis- Tues, Thurs, Sat in [facility] neph[facility]: Dr BDonnetta Simpers  Malignant essential hypertension   Acute diastolic CHF (congestive heart failure)   Gastroenteritis presumed infectious   Acute on chronic combined systolic and diastolic CHF (congestive heart failure)   Acute pulmonary edema   Pericardial effusion   Diabetes type 2, uncontrolled   Anxiety state   Depression   Chronic pain syndrome   Noncompliance with medication regimen   End-stage renal disease on hemodialysis   Nonischemic cardiomyopathy   Chronic pulmonary edema   Pulmonary embolus   Discharge Condition: Stable  Diet recommendation: Heart healthy/diabetic  Filed Weights   10/01/14 0000 10/01/14 1300 10/01/14 1646  Weight: 77.8 kg (171 lb 8.3 oz) 66.8 kg (147 lb 4.3 oz) 65 kg (143 lb 4.8 oz)    History of present illness:  Frank TOOMEYis a 49y.o. BM PMHx anxiety, depression, noncompliance, diabetes type 2 uncontrolled, combined systolic and diastolic CHF, ESRD, dialysis T/T/S. chronic pain syndrome, Recently seen 2 days ago after missing dialysis and developing acute CHF as a result with fluid overload. Patient presents to the ED with SOB, DOE, orthopnea. Today he developed N/V he denies fever subjectively (objectively has T of 100.5 in ED). The N/V isnt very severe, but it did mean that  he couldn't keep his BP meds down at home and as a result his BPs here in the ED have been running from 620B to 559R systolic with diastolics from 416L and up.  During patient's stay he received emergent HD which did help to control his hypertensive urgency. In addition an echocardiogram was performed which showed worsening nonischemic cardiomyopathy. His home medications were added back to his regimen and his BP is now well controlled. Following HD by Nephrology patient is at his post dialysis weight. In addition attempted to diagnose and treat patient for URI, however patient refused diagnostic tests. Patient without fever, leukocytosis, however does have rhonchi and some expiratory wheezing. Unfortunately patient's URI appears to be bilateral nature therefore no antibiotics will be given, and patient is outside window for antivirals (Tamiflu). Have spoken with Dr. Jenkins Rouge (cardiology) who will have the CHF clinic contact patient on Monday to schedule follow-up appointment. Will discharge patient with bronchodilator.  Consultants: Dr.Robert Doctor, hospital (nephrology) Dr. Jenkins Rouge (cardiology); phone consult   Procedure/Significant Events: 2/12 echocardiogram; Left ventricle: Systolic function mildly to moderately reduced.-LVEF 40% to 45%.Diffuse hypokinesis. (grade 1 diastolicdysfunction). - Pulmonary arteries: PA peak pressure: 31 mm Hg (S). - Pericardium, extracardiac: A small, free-flowing pericardial effusion was identified circumferential to the heart. No evidence of hemodynamic compromise. 3/13 echocardiogram;- Left ventricle: moderate LVH. -LVEF= 30% to 35%. Diffuse hypokinesis. - Left atrium: severely dilated. - Right ventricle: moderately dilated. - Pulmonary arteries: PA peak pressure: 40 mm Hg (S). - Pericardium, extracardiac: A small pericardial effusion was identified circumferential to the heart with moderate collectionat anterior base around right atrium.   Culture -Acute hepatitis panel negative -Respiratory virus panel patient refused swab -Influenza panel patient refused swab      Discharge Exam: Filed Vitals:    10/02/14 0700 10/02/14 0846 10/02/14 1244 10/02/14 1451  BP: 146/92 149/92 121/75   Pulse: 73  73   Temp:  98.5 F (36.9 C) 98.9 F (37.2 C)   TempSrc:  Oral Oral   Resp: 29  9   Height:      Weight:      SpO2: 97% 93% 93% 100%    General: A/O 4, productive cough (clear sputum), No acute respiratory distress Lungs: diffuse rhonchi, positive mild diffuse respiratory wheezes Cardiovascular: Regular rate and rhythm without murmur gallop or rub normal S1 and S2 Abdomen: Nontender, nondistended, soft, bowel sounds positive, no rebound, no ascites, no appreciable mass Extremities: No significant cyanosis, clubbing, or edema bilateral lower extremities   Discharge Instructions     Medication List    STOP taking these medications        acetaminophen 325 MG tablet  Commonly known as:  TYLENOL     allopurinol 100 MG tablet  Commonly known as:  ZYLOPRIM     enoxaparin 80 MG/0.8ML injection  Commonly known as:  LOVENOX     ondansetron 8 MG disintegrating tablet  Commonly known as:  ZOFRAN-ODT     oxyCODONE 15 MG immediate release tablet  Commonly known as:  ROXICODONE     oxyCODONE 5 MG immediate release tablet  Commonly known as:  Oxy IR/ROXICODONE      TAKE these medications        ALPRAZolam 0.25 MG tablet  Commonly known as:  XANAX  Take 0.25 mg by mouth daily as needed. For sleep     amLODipine 5 MG tablet  Commonly known as:  NORVASC  Take 10 mg by mouth daily.     atorvastatin  40 MG tablet  Commonly known as:  LIPITOR  Take 1 tablet (40 mg total) by mouth daily at 6 PM.     carvedilol 25 MG tablet  Commonly known as:  COREG  Take 25 mg by mouth 2 (two) times daily with a meal.     cinacalcet 30 MG tablet  Commonly known as:  SENSIPAR  Take 30 mg by mouth daily.     cloNIDine 0.3 mg/24hr patch  Commonly known as:  CATAPRES - Dosed in mg/24 hr  Place 1 patch onto the skin. Every 7 days     Ferric Citrate 210 MG Tabs  Take 210 mg by mouth 3 (three)  times daily with meals. Take 1 with each meal and 1 with a snack.     gentamicin cream 0.1 %  Commonly known as:  GARAMYCIN  Apply to [place] site     hydrALAZINE 25 MG tablet  Commonly known as:  APRESOLINE  Take 100 mg by mouth 3 (three) times daily.     hydrocortisone 1 % lotion  Apply to site with itch as needed twice daily     levalbuterol 45 MCG/ACT inhaler  Commonly known as:  XOPENEX HFA  Inhale 2 puffs into the lungs every 4 (four) hours as needed for wheezing or shortness of breath.     methocarbamol 750 MG tablet  Commonly known as:  ROBAXIN-750  Take 1 tablet (750 mg total) by mouth every 6 (six) hours as needed for muscle spasms.     minoxidil 2.5 MG tablet  Commonly known as:  LONITEN  Take 1 tablet (2.5 mg total) by mouth daily.     nabumetone 500 MG tablet  Commonly known as:  RELAFEN  Take 500 mg by mouth 2 (two) times daily.     NIFEdipine 30 MG 24 hr tablet  Commonly known as:  PROCARDIA-XL/ADALAT CC  Take 1 tablet (30 mg total) by mouth daily.     omeprazole 20 MG capsule  Commonly known as:  PRILOSEC  Take 20 mg by mouth daily.     polyethylene glycol packet  Commonly known as:  MIRALAX  Take 17 g by mouth daily. For constipation     predniSONE 10 MG tablet  Commonly known as:  DELTASONE  Take 10 mg by mouth daily with breakfast.     RENVELA 800 MG tablet  Generic drug:  sevelamer carbonate  Take 1,600 mg by mouth 3 (three) times daily with meals.     senna-docusate 8.6-50 MG per tablet  Commonly known as:  Senokot-S  Take 1 tablet by mouth 2 (two) times daily.     sulfamethoxazole-trimethoprim 400-80 MG per tablet  Commonly known as:  BACTRIM,SEPTRA  Take 1 tablet by mouth every Monday, Wednesday, and Friday.     tacrolimus 1 MG capsule  Commonly known as:  PROGRAF  Take 2 mg by mouth 2 (two) times daily.     traMADol 50 MG tablet  Commonly known as:  ULTRAM  Take 2 tablets (100 mg total) by mouth every 6 (six) hours as needed.      warfarin 5 MG tablet  Commonly known as:  COUMADIN  Take 5 mg tonight, then 2.5 mg daily or as directed by physician based on INR     warfarin 5 MG tablet  Commonly known as:  COUMADIN  Take 1.5 tablets (7.5 mg total) by mouth one time only at 6 PM. Take 62m daily at 6pm  Allergies  Allergen Reactions  . Ferrlecit [Na Ferric Gluc Cplx In Sucrose] Shortness Of Breath, Swelling and Other (See Comments)    Swelling in throat  . Darvocet [Propoxyphene N-Acetaminophen] Hives   Follow-up Information    Follow up with Frank Manning, NP. Schedule an appointment as soon as possible for a visit in 1 week.   Specialty:  Internal Medicine   Why:  Hospital follow-up; acute respiratory failure, new onset diabetes (iatrogenic), worsening CHF   Contact information:   [place] [address] [phone]        The results of significant diagnostics from this hospitalization (including imaging, microbiology, ancillary and laboratory) are listed below for reference.    Significant Diagnostic Studies: Dg Chest 2 View  09/30/2014   CLINICAL DATA:  Vomiting, nausea, cough and sore throat for 1 week, history hypertension, diabetes, end-stage renal disease, anxiety, former smoker  EXAM: CHEST  2 VIEW  COMPARISON:  09/28/2014  FINDINGS: RIGHT jugular dual-lumen dialysis catheter with tip projecting over cavoatrial junction.  Enlargement of cardiac silhouette with pulmonary vascular congestion.  Mediastinal contours normal.  Minimal peribronchial thickening.  Slightly improved perihilar edema.  No segmental consolidation, pleural effusion, or pneumothorax.  IMPRESSION: Enlargement of cardiac silhouette with pulmonary vascular congestion.  Minimal perihilar edema, improved   Electronically Signed   By: Lavonia Dana M.D.   On: 09/30/2014 16:20   Dg Chest 2 View  09/28/2014   CLINICAL DATA:  Nausea, vomiting, chest pain, shortness of breath. Dialysis patient.  EXAM: CHEST  2 VIEW   COMPARISON:  Chest radiograph May 20, 2015  FINDINGS: The cardiac silhouette is moderately enlarged, similar. Increasing peribronchial cuff and central pulmonary vascular congestion without pleural effusion or focal consolidation. Mediastinal silhouette is unremarkable. No pneumothorax.  Tunneled dialysis catheter via RIGHT internal jugular venous approach with distal tip projecting in proximal atrium, unchanged. Soft tissue planes and included osseous structures are nonsuspicious.  IMPRESSION: Stable cardiomegaly, mildly worsening fluid overload.   Electronically Signed   By: Elon Alas   On: 09/28/2014 04:04   Dg Chest 2 View  09/19/2014   CLINICAL DATA:  Chest pain  EXAM: CHEST  2 VIEW  COMPARISON:  09/12/2014  FINDINGS: Dialysis catheter from right IJ approach is stable, tip in the upper right atrium. Stable mild cardiomegaly and aortic tortuosity. There is no edema, consolidation, effusion, or pneumothorax.  IMPRESSION: No active cardiopulmonary disease.   Electronically Signed   By: Monte Fantasia M.D.   On: 09/19/2014 01:39   Dg Chest 2 View  09/12/2014   CLINICAL DATA:  Chest pain.  EXAM: CHEST  2 VIEW  COMPARISON:  Radiographs and CT 08/30/2014  FINDINGS: Right-sided dialysis catheter remains in place. Cardiomegaly is stable. No pulmonary edema. There is mild peribronchial cuffing. No consolidation to suggest pneumonia. No pleural effusion or pneumothorax. No acute osseous abnormalities.  IMPRESSION: Mild peribronchial cuffing, may reflect bronchitis. Stable cardiomegaly.   Electronically Signed   By: Jeb Levering M.D.   On: 09/12/2014 03:58    Microbiology: No results found for this or any previous visit (from the past 240 hour(s)).   Labs: Basic Metabolic Panel:  Recent Labs Lab 09/28/14 0340 09/30/14 1602 10/01/14 0651 10/01/14 1655  NA 137 136 135 133*  K 4.7 4.7 5.0 5.4*  CL 100 99 100 96  CO2 23  --  26 25  GLUCOSE 85 97 94 96  BUN 73* 31* 39* 44*  CREATININE  15.97* 8.80* 11.27* 11.95*  CALCIUM 8.7  --  8.3* 8.0*  PHOS  --   --   --  4.8*   Liver Function Tests:  Recent Labs Lab 09/28/14 0340 10/01/14 1655  AST 46*  --   ALT 20  --   ALKPHOS 99  --   BILITOT 0.8  --   PROT 7.2  --   ALBUMIN 3.1* 2.6*   No results for input(s): LIPASE, AMYLASE in the last 168 hours. No results for input(s): AMMONIA in the last 168 hours. CBC:  Recent Labs Lab 09/28/14 0340 09/30/14 1556 09/30/14 1602 10/01/14 0651 10/01/14 1655 10/02/14 0305  WBC 6.4 6.4  --  5.9 5.6 5.1  NEUTROABS 4.3 4.7  --   --   --   --   HGB 8.5* 8.5* 10.5* 7.5* 7.2* 7.3*  HCT 26.5* 27.1* 31.0* 23.3* 22.4* 22.8*  MCV 77.0* 79.7  --  78.7 78.0 77.8*  PLT 149* 155  --  125* 130* 129*   Cardiac Enzymes:  Recent Labs Lab 09/28/14 0340  TROPONINI 0.07*   BNP: BNP (last 3 results)  Recent Labs  08/30/14 0103 09/12/14 0251 09/30/14 1556  BNP 1924.6* >4500.0* >4500.0*    ProBNP (last 3 results)  Recent Labs  01/02/14 0317 01/18/14 0048 04/15/14 0809  PROBNP >70000.0* >70000.0* >70000.0*    CBG: No results for input(s): GLUCAP in the last 168 hours.     Signed:  Dia Crawford, MD [facility] [phone] pager

## 2014-10-02 NOTE — Progress Notes (Addendum)
ANTICOAGULATION CONSULT NOTE - [facility] for Heparin Indication: h/o PE. Bridge to coumadin until INR theraputic  Allergies  Allergen Reactions  . Ferrlecit [Na Ferric Gluc Cplx In Sucrose] Shortness Of Breath, Swelling and Other (See Comments)    Swelling in throat  . Darvocet [Propoxyphene N-Acetaminophen] Hives    Patient Measurements: Height: 6' 1" (185.4 cm) Weight: 143 lb 4.8 oz (65 kg) IBW/kg (Calculated) : 79.9 Heparin Dosing Weight: 77 kg  Vital Signs: Temp: 98.7 F (37.1 C) (03/12 1900) Temp Source: Oral (03/12 1900) BP: 145/96 mmHg (03/13 0400) Pulse Rate: 81 (03/13 0400)  Labs:  Recent Labs  09/30/14 1556 09/30/14 1602 10/01/14 0651 10/01/14 1655 10/01/14 1845 10/02/14 0305  HGB 8.5* 10.5* 7.5* 7.2*  --  7.3*  HCT 27.1* 31.0* 23.3* 22.4*  --  22.8*  PLT 155  --  125* 130*  --  129*  APTT 36  --   --   --   --   --   LABPROT 17.8*  --  20.7*  --   --  24.1*  INR 1.45  --  1.76*  --   --  2.14*  HEPARINUNFRC  --   --  <0.10*  --  0.15* 0.19*  CREATININE  --  8.80* 11.27* 11.95*  --   --     Estimated Creatinine Clearance: 6.8 mL/min (by C-G formula based on Cr of 11.95).   Medications:   . heparin 1,500 Units/hr (10/02/14 0000)    Assessment: 8 hour heparin level is 0.19 on 1500 units/hr for h/o acute PE (Oct. 2015).  INR on admit was subtherapeutic. Today INR =2.14.  Hgb 7.3, Pltc 129K  RN reports heparin infusing without complication.  No bleeding noted.   Goal of Therapy:  INR 2-3 Monitor platelets by anticoagulation protocol: Yes    PLAN:  Increase heparin drip to 1700 units/hr Check 6 hour heparin level Coumadin 2.5 mg po today x1 Daily heparin level, INR, CBC, s/sx of bleeding  Nicole Cella, RPh Clinical Pharmacist Pager: [phone] 10/02/2014 5:12 AM  ___________________________________  Plan: - Stop heparin gtt now that INR is therapeutic - Coumadin 2.58m x1 tonight - Monitor INR, CBC, s/sx of  bleeding  TDrucie Opitz PharmD Clinical Pharmacy Resident Pager: [phone]/13/2016 7:59 AM

## 2014-10-02 NOTE — Discharge Instructions (Signed)

## 2014-10-03 LAB — RAPID HIV SCREEN (WH-MAU): Rapid HIV Screen: NONREACTIVE

## 2014-10-06 ENCOUNTER — Emergency Department (HOSPITAL_COMMUNITY)
Admission: EM | Admit: 2014-10-06 | Discharge: 2014-10-06 | Disposition: A | Discharge status: Home / Self Care | Payer: Medicare Other | Source: Home / Self Care | Attending: Emergency Medicine | Admitting: Emergency Medicine

## 2014-10-06 ENCOUNTER — Encounter (HOSPITAL_COMMUNITY): Payer: Self-pay

## 2014-10-06 DIAGNOSIS — N186 End stage renal disease: Secondary | ICD-10-CM

## 2014-10-06 DIAGNOSIS — I10 Essential (primary) hypertension: Secondary | ICD-10-CM

## 2014-10-06 DIAGNOSIS — Z992 Dependence on renal dialysis: Secondary | ICD-10-CM | POA: Insufficient documentation

## 2014-10-06 DIAGNOSIS — J189 Pneumonia, unspecified organism: Secondary | ICD-10-CM | POA: Diagnosis not present

## 2014-10-06 DIAGNOSIS — I120 Hypertensive chronic kidney disease with stage 5 chronic kidney disease or end stage renal disease: Secondary | ICD-10-CM | POA: Insufficient documentation

## 2014-10-06 DIAGNOSIS — Z79899 Other long term (current) drug therapy: Secondary | ICD-10-CM | POA: Insufficient documentation

## 2014-10-06 DIAGNOSIS — E119 Type 2 diabetes mellitus without complications: Secondary | ICD-10-CM | POA: Insufficient documentation

## 2014-10-06 DIAGNOSIS — F419 Anxiety disorder, unspecified: Secondary | ICD-10-CM | POA: Insufficient documentation

## 2014-10-06 DIAGNOSIS — Z7952 Long term (current) use of systemic steroids: Secondary | ICD-10-CM | POA: Insufficient documentation

## 2014-10-06 DIAGNOSIS — M545 Low back pain: Secondary | ICD-10-CM | POA: Insufficient documentation

## 2014-10-06 DIAGNOSIS — Z9889 Other specified postprocedural states: Secondary | ICD-10-CM | POA: Insufficient documentation

## 2014-10-06 DIAGNOSIS — R0602 Shortness of breath: Secondary | ICD-10-CM | POA: Diagnosis not present

## 2014-10-06 DIAGNOSIS — M25511 Pain in right shoulder: Secondary | ICD-10-CM

## 2014-10-06 DIAGNOSIS — Z7901 Long term (current) use of anticoagulants: Secondary | ICD-10-CM | POA: Insufficient documentation

## 2014-10-06 DIAGNOSIS — F329 Major depressive disorder, single episode, unspecified: Secondary | ICD-10-CM | POA: Insufficient documentation

## 2014-10-06 DIAGNOSIS — Z87891 Personal history of nicotine dependence: Secondary | ICD-10-CM | POA: Insufficient documentation

## 2014-10-06 LAB — CBC WITH DIFFERENTIAL/PLATELET
Basophils Absolute: 0.1 10*3/uL (ref 0.0–0.1)
Basophils Relative: 1 % (ref 0–1)
Eosinophils Absolute: 0.2 10*3/uL (ref 0.0–0.7)
Eosinophils Relative: 3 % (ref 0–5)
HCT: 26.3 % — ABNORMAL LOW (ref 39.0–52.0)
Hemoglobin: 8.3 g/dL — ABNORMAL LOW (ref 13.0–17.0)
LYMPHS ABS: 1.7 10*3/uL (ref 0.7–4.0)
Lymphocytes Relative: 28 % (ref 12–46)
MCH: 25.5 pg — AB (ref 26.0–34.0)
MCHC: 31.6 g/dL (ref 30.0–36.0)
MCV: 80.7 fL (ref 78.0–100.0)
MONO ABS: 0.6 10*3/uL (ref 0.1–1.0)
Monocytes Relative: 10 % (ref 3–12)
Neutro Abs: 3.4 10*3/uL (ref 1.7–7.7)
Neutrophils Relative %: 58 % (ref 43–77)
Platelets: 165 10*3/uL (ref 150–400)
RBC: 3.26 MIL/uL — ABNORMAL LOW (ref 4.22–5.81)
RDW: 18.8 % — ABNORMAL HIGH (ref 11.5–15.5)
WBC: 6 10*3/uL (ref 4.0–10.5)

## 2014-10-06 LAB — TROPONIN I
TROPONIN I: 0.09 ng/mL — AB (ref <0.031)
Troponin I: 0.08 ng/mL — ABNORMAL HIGH (ref <0.031)

## 2014-10-06 LAB — BASIC METABOLIC PANEL
Anion gap: 13 (ref 5–15)
BUN: 49 mg/dL — ABNORMAL HIGH (ref 6–23)
CALCIUM: 8 mg/dL — AB (ref 8.4–10.5)
CHLORIDE: 100 mmol/L (ref 96–112)
CO2: 24 mmol/L (ref 19–32)
Creatinine, Ser: 13.06 mg/dL — ABNORMAL HIGH (ref 0.50–1.35)
GFR calc Af Amer: 4 mL/min — ABNORMAL LOW (ref >90)
GFR calc non Af Amer: 4 mL/min — ABNORMAL LOW (ref >90)
Glucose, Bld: 89 mg/dL (ref 70–99)
Potassium: 4.7 mmol/L (ref 3.5–5.1)
SODIUM: 137 mmol/L (ref 135–145)

## 2014-10-06 LAB — PROTIME-INR
INR: 1.54 — ABNORMAL HIGH (ref 0.00–1.49)
PROTHROMBIN TIME: 18.6 s — AB (ref 11.6–15.2)

## 2014-10-06 MED ORDER — ONDANSETRON HCL 4 MG/2ML IJ SOLN
4.0000 mg | Freq: Once | INTRAMUSCULAR | Status: AC
  Administered 2014-10-06: 4 mg via INTRAVENOUS
  Filled 2014-10-06: qty 2

## 2014-10-06 MED ORDER — HYDROMORPHONE HCL 1 MG/ML IJ SOLN
1.0000 mg | Freq: Once | INTRAMUSCULAR | Status: AC
  Administered 2014-10-06: 1 mg via INTRAVENOUS
  Filled 2014-10-06: qty 1

## 2014-10-06 MED ORDER — OXYCODONE HCL 5 MG PO TABS: 5.0000 mg | ORAL_TABLET | ORAL | Status: DC | PRN

## 2014-10-06 NOTE — ED Provider Notes (Signed)
CSN: [id number]     Arrival date & time 10/06/14  0129 History   First MD Initiated Contact with Patient 10/06/14 0455     Chief Complaint  Patient presents with  . Shoulder Pain  . Back Pain     (Consider location/radiation/quality/duration/timing/severity/associated sxs/prior Treatment) Patient is a 49 y.o. male presenting with shoulder pain and back pain. The history is provided by the patient.  Shoulder Pain Associated symptoms: back pain   Back Pain He had onset 2 days ago of pain in his lower back and in the left shoulder. Pain started during dialysis and he was unable to complete dialysis because of pain. Pain is worse with any movement and worse with laying down. He rates pain at 8/10. He had been in the hospital recently was discharged with prescription for, and all but did not get it filled because it does not help him. He had previously been on oxycodone for pain but had this discontinued when he was hospitalized. He denies any recent trauma. Denies any weakness, numbness, timing. Denies any bowel dysfunction.  Past Medical History  Diagnosis Date  . Hypertension   . Depression   . Complication of anesthesia     itching, sore throat  . Diabetes mellitus without complication     No history per patient, but remains under history as A1c would not be accurate given on dialysis  . Shortness of breath   . Anxiety   . ESRD (end stage renal disease)     due to HTN per patient, followed at [facility], s/p failed kidney transplant - dialysis Tue, Th, Sat   Past Surgical History  Procedure Laterality Date  . Kidney receipient  2006    failed and started HD in March 2014  . Capd insertion    . Capd removal    . Left heart catheterization with coronary angiogram N/A 09/02/2014    Procedure: LEFT HEART CATHETERIZATION WITH CORONARY ANGIOGRAM;  Surgeon: [name], MD;  Location: [facility] CATH LAB;  Service: Cardiovascular;  Laterality: N/A;   No family history on file. History   Substance Use Topics  . Smoking status: Former Smoker -- 1.00 packs/day for 1 years    Types: Cigarettes  . Smokeless tobacco: Never Used     Comment: quit Jan 2014  . Alcohol Use: No    Review of Systems  Musculoskeletal: Positive for back pain.  All other systems reviewed and are negative.     Allergies  Ferrlecit and Darvocet  Home Medications   Prior to Admission medications   Medication Sig Start Date End Date Taking? Authorizing Provider  ALPRAZolam (XANAX) 0.25 MG tablet Take 0.25 mg by mouth daily as needed. For sleep 06/25/14  Yes Historical Provider, MD  amLODipine (NORVASC) 5 MG tablet Take 10 mg by mouth daily.   Yes Historical Provider, MD  carvedilol (COREG) 25 MG tablet Take 25 mg by mouth 3 (three) times daily.    Yes Historical Provider, MD  cinacalcet (SENSIPAR) 30 MG tablet Take 30 mg by mouth daily.   Yes Historical Provider, MD  Ferric Citrate 210 MG TABS Take 210 mg by mouth 3 (three) times daily with meals. Take 1 with each meal and 1 with a snack. 08/08/14  Yes Historical Provider, MD  gentamicin cream (GARAMYCIN) 0.1 % Apply to [place] site 06/13/14  Yes Historical Provider, MD  hydrALAZINE (APRESOLINE) 25 MG tablet Take 100 mg by mouth 3 (three) times daily.    Yes Historical Provider, MD  hydrocortisone 1 % lotion Apply to site with itch as needed twice daily 06/13/14  Yes Historical Provider, MD  levalbuterol (XOPENEX HFA) 45 MCG/ACT inhaler Inhale 2 puffs into the lungs every 4 (four) hours as needed for wheezing or shortness of breath. 10/02/14  Yes Allie Bossier, MD  methocarbamol (ROBAXIN-750) 750 MG tablet Take 1 tablet (750 mg total) by mouth every 6 (six) hours as needed for muscle spasms. 09/19/14  Yes Linton Flemings, MD  nabumetone (RELAFEN) 500 MG tablet Take 500 mg by mouth 2 (two) times daily. 09/12/14 11/11/14 Yes Historical Provider, MD  omeprazole (PRILOSEC) 20 MG capsule Take 20 mg by mouth daily. 07/01/13  Yes Historical Provider, MD   predniSONE (DELTASONE) 10 MG tablet Take 10 mg by mouth daily with breakfast.   Yes Historical Provider, MD  sevelamer carbonate (RENVELA) 800 MG tablet Take 1,600 mg by mouth 3 (three) times daily with meals. 12/27/13 12/27/14 Yes Historical Provider, MD  tacrolimus (PROGRAF) 1 MG capsule Take 2 mg by mouth 2 (two) times daily. 07/14/14  Yes Historical Provider, MD  warfarin (COUMADIN) 2 MG tablet Take 2 mg by mouth daily. Take along with warfarin 79m to equal 715m   Yes Historical Provider, MD  warfarin (COUMADIN) 5 MG tablet Take 5 mg by mouth daily. Take along with Warfarin 22m85mo equal 7mg24mYes Historical Provider, MD  atorvastatin (LIPITOR) 40 MG tablet Take 1 tablet (40 mg total) by mouth daily at 6 PM. Patient not taking: Reported on 09/19/2014 09/02/14   CarlBarton Dubois  cloNIDine (CATAPRES - DOSED IN MG/24 HR) 0.3 mg/24hr patch Place 1 patch onto the skin. Every 7 days 08/08/14   Historical Provider, MD  minoxidil (LONITEN) 2.5 MG tablet Take 1 tablet (2.5 mg total) by mouth daily. Patient taking differently: Take 5 mg by mouth daily.  04/06/14   CoriDelfina Redwood  NIFEdipine (PROCARDIA-XL/ADALAT CC) 30 MG 24 hr tablet Take 1 tablet (30 mg total) by mouth daily. Patient not taking: Reported on 09/19/2014 04/06/14   CoriDelfina Redwood  polyethylene glycol ([facility] Take 17 g by mouth daily. For constipation Patient taking differently: Take 17 g by mouth daily as needed for mild constipation. For constipation 05/12/14   PreeDomenic Polite  sulfamethoxazole-trimethoprim (BACTRIM,SEPTRA) 400-80 MG per tablet Take 1 tablet by mouth every Monday, Wednesday, and Friday.    Historical Provider, MD  traMADol (ULTRAM) 50 MG tablet Take 2 tablets (100 mg total) by mouth every 6 (six) hours as needed. Patient not taking: Reported on 09/14/2014 05/05/14   Iva Rolland Porter  warfarin (COUMADIN) 5 MG tablet Take 1.5 tablets (7.5 mg total) by mouth one time only at 6 PM. Take 5mg 73mly at  6pm Patient taking differently: Take 5 mg by mouth daily.  09/02/14   CarloBarton Dubois  BP 199/134 mmHg  Pulse 71  Temp(Src) 97.9 F (36.6 C) (Oral)  Resp 18  SpO2 95% Physical Exam  Nursing note and vitals reviewed.  48 ye55 old male, is uncomfortable and is pacing the floor, but his in no acute distress. Vital signs are significant for severe hypertension. Oxygen saturation is 95%, which is normal. Head is normocephalic and atraumatic. PERRLA, EOMI. Oropharynx is clear. Fundi show exudates but no hemorrhage or papilledema. Arterial and narrowing is also noted. Neck is nontender and supple without adenopathy or JVD. Back moderately tender throughout the lumbar spine without point tenderness. There is no CVA tenderness. Lungs are clear  without rales, wheezes, or rhonchi. Chest is nontender. Dialysis access catheters present in the right subclavian area. Heart has regular rate and rhythm without murmur. Abdomen is soft, flat, nontender without masses or hepatosplenomegaly and peristalsis is normoactive. Extremities: There is tenderness rather diffusely throughout the right shoulder and pain with passive range of motion. Distal neurovascular exam is intact with strong pulses, prompt capillary refill, normal sensation and movement.. Skin is warm and dry without rash. Neurologic: Mental status is normal, cranial nerves are intact, there are no motor or sensory deficits.  ED Course  Procedures (including critical care time) Labs Review Results for orders placed or performed during the hospital encounter of 10/06/14  Protime-INR  Result Value Ref Range   Prothrombin Time 18.6 (H) 11.6 - 15.2 seconds   INR 1.54 (H) 0.00 - 1.49  Troponin I  Result Value Ref Range   Troponin I 0.09 (H) <0.031 ng/mL  Basic metabolic panel  Result Value Ref Range   Sodium 137 135 - 145 mmol/L   Potassium 4.7 3.5 - 5.1 mmol/L   Chloride 100 96 - 112 mmol/L   CO2 24 19 - 32 mmol/L   Glucose, Bld 89 70 -  99 mg/dL   BUN 49 (H) 6 - 23 mg/dL   Creatinine, Ser 13.06 (H) 0.50 - 1.35 mg/dL   Calcium 8.0 (L) 8.4 - 10.5 mg/dL   GFR calc non Af Amer 4 (L) >90 mL/min   GFR calc Af Amer 4 (L) >90 mL/min   Anion gap 13 5 - 15  CBC with Differential  Result Value Ref Range   WBC 6.0 4.0 - 10.5 K/uL   RBC 3.26 (L) 4.22 - 5.81 MIL/uL   Hemoglobin 8.3 (L) 13.0 - 17.0 g/dL   HCT 26.3 (L) 39.0 - 52.0 %   MCV 80.7 78.0 - 100.0 fL   MCH 25.5 (L) 26.0 - 34.0 pg   MCHC 31.6 30.0 - 36.0 g/dL   RDW 18.8 (H) 11.5 - 15.5 %   Platelets 165 150 - 400 K/uL   Neutrophils Relative % 58 43 - 77 %   Lymphocytes Relative 28 12 - 46 %   Monocytes Relative 10 3 - 12 %   Eosinophils Relative 3 0 - 5 %   Basophils Relative 1 0 - 1 %   Neutro Abs 3.4 1.7 - 7.7 K/uL   Lymphs Abs 1.7 0.7 - 4.0 K/uL   Monocytes Absolute 0.6 0.1 - 1.0 K/uL   Eosinophils Absolute 0.2 0.0 - 0.7 K/uL   Basophils Absolute 0.1 0.0 - 0.1 K/uL   Smear Review MORPHOLOGY UNREMARKABLE      EKG Interpretation   Date/Time:  Thursday October 06 2014 02:02:09 EDT Ventricular Rate:  78 PR Interval:  179 QRS Duration: 85 QT Interval:  459 QTC Calculation: 523 R Axis:   -29 Text Interpretation:  Sinus rhythm Probable left atrial enlargement Left  ventricular hypertrophy Nonspecific T abnormalities, lateral leads  Prolonged QT interval Baseline wander in lead(s) V2 When compared with ECG  of 09/30/2014, QT has lengthened Confirmed by [facility]  MD, Jariah Tarkowski ([id number]) on  10/06/2014 2:18:47 AM      MDM   Final diagnoses:  Low back pain without sciatica, unspecified back pain laterality  End stage renal disease on dialysis  Pain in right shoulder    Back pain and left shoulder pain of uncertain cause. I reviewed his old records and he was recently hospitalized for hypertensive urgency and was taken off of his  long-standing pain medications of oxycodone and discharged with tramadol. It may be that his pain is mostly reaction to this drop in pain  medication. I reviewed prior imaging studies and he does have some arthritic changes in his lumbar spine but no evidence of aortic aneurysm. Screening labs will be obtained and he will be given hydromorphone for pain. Blood pressure OB rechecked after adequate pain control to see how much of his hypertension is adrenergic driven.  Blood pressure has come down with pain control. Troponin has come back mildly elevated. Review of past records shows this is arranged in this troponin generally is at. I suspect his troponin elevation is related to his renal failure. Will get 3 hour troponin, and, if no increase in that time, we'll discharge with prescription for oxycodone. Case is signed out to Dr. Doy Mince.  Delora Fuel, MD 53/20/23 3435

## 2014-10-06 NOTE — ED Notes (Signed)
Dr. [name] made aware of pt's BP.

## 2014-10-06 NOTE — ED Provider Notes (Signed)
Care assumed from Dr. [name], with the plan to discharge if troponin flat.  It is 0.08, which appears to be his baseline.  Will dc home so he can go to dialysis.    Serita Grit, MD 10/06/14 [phone]

## 2014-10-06 NOTE — ED Notes (Signed)
Bed: WA03 Expected date:  Expected time:  Means of arrival:  Comments:

## 2014-10-06 NOTE — ED Notes (Signed)
Pt encouraged to lay down and relax r/t high BP. When pharmacy tech entered the room, pt was up pacing around the room and did not want to lay down because he was hurting.

## 2014-10-06 NOTE — ED Notes (Signed)
MD at bedside. 

## 2014-10-06 NOTE — Discharge Instructions (Signed)
Go for your dialysis today.  You need to work out yoru pain management with your primary care physician.  Acetaminophen; Oxycodone tablets What is this medicine? ACETAMINOPHEN; OXYCODONE (a set a MEE noe fen; ox i KOE done) is a pain reliever. It is used to treat mild to moderate pain. This medicine may be used for other purposes; ask your health care provider or pharmacist if you have questions. COMMON BRAND NAME(S): Endocet, Magnacet, Narvox, Percocet, Perloxx, Primalev, Primlev, Roxicet, Xolox What should I tell my health care provider before I take this medicine? They need to know if you have any of these conditions: -brain tumor -Crohn's disease, inflammatory bowel disease, or ulcerative colitis -drug abuse or addiction -head injury -heart or circulation problems -if you often drink alcohol -kidney disease or problems going to the bathroom -liver disease -lung disease, asthma, or breathing problems -an unusual or allergic reaction to acetaminophen, oxycodone, other opioid analgesics, other medicines, foods, dyes, or preservatives -pregnant or trying to get pregnant -breast-feeding How should I use this medicine? Take this medicine by mouth with a full glass of water. Follow the directions on the prescription label. Take your medicine at regular intervals. Do not take your medicine more often than directed. Talk to your pediatrician regarding the use of this medicine in children. Special care may be needed. Patients over 110 years old may have a stronger reaction and need a smaller dose. Overdosage: If you think you have taken too much of this medicine contact a poison control center or emergency room at once. NOTE: This medicine is only for you. Do not share this medicine with others. What if I miss a dose? If you miss a dose, take it as soon as you can. If it is almost time for your next dose, take only that dose. Do not take double or extra doses. What may interact with this  medicine? -alcohol -antihistamines -barbiturates like amobarbital, butalbital, butabarbital, methohexital, pentobarbital, phenobarbital, thiopental, and secobarbital -benztropine -drugs for bladder problems like solifenacin, trospium, oxybutynin, tolterodine, hyoscyamine, and methscopolamine -drugs for breathing problems like ipratropium and tiotropium -drugs for certain stomach or intestine problems like propantheline, homatropine methylbromide, glycopyrrolate, atropine, belladonna, and dicyclomine -general anesthetics like etomidate, ketamine, nitrous oxide, propofol, desflurane, enflurane, halothane, isoflurane, and sevoflurane -medicines for depression, anxiety, or psychotic disturbances -medicines for sleep -muscle relaxants -naltrexone -narcotic medicines (opiates) for pain -phenothiazines like perphenazine, thioridazine, chlorpromazine, mesoridazine, fluphenazine, prochlorperazine, promazine, and trifluoperazine -scopolamine -tramadol -trihexyphenidyl This list may not describe all possible interactions. Give your health care provider a list of all the medicines, herbs, non-prescription drugs, or dietary supplements you use. Also tell them if you smoke, drink alcohol, or use illegal drugs. Some items may interact with your medicine. What should I watch for while using this medicine? Tell your doctor or health care professional if your pain does not go away, if it gets worse, or if you have new or a different type of pain. You may develop tolerance to the medicine. Tolerance means that you will need a higher dose of the medication for pain relief. Tolerance is normal and is expected if you take this medicine for a long time. Do not suddenly stop taking your medicine because you may develop a severe reaction. Your body becomes used to the medicine. This does NOT mean you are addicted. Addiction is a behavior related to getting and using a drug for a non-medical reason. If you have pain, you  have a medical reason to take pain medicine. Your doctor  will tell you how much medicine to take. If your doctor wants you to stop the medicine, the dose will be slowly lowered over time to avoid any side effects. You may get drowsy or dizzy. Do not drive, use machinery, or do anything that needs mental alertness until you know how this medicine affects you. Do not stand or sit up quickly, especially if you are an older patient. This reduces the risk of dizzy or fainting spells. Alcohol may interfere with the effect of this medicine. Avoid alcoholic drinks. There are different types of narcotic medicines (opiates) for pain. If you take more than one type at the same time, you may have more side effects. Give your health care provider a list of all medicines you use. Your doctor will tell you how much medicine to take. Do not take more medicine than directed. Call emergency for help if you have problems breathing. The medicine will cause constipation. Try to have a bowel movement at least every 2 to 3 days. If you do not have a bowel movement for 3 days, call your doctor or health care professional. Do not take Tylenol (acetaminophen) or medicines that have acetaminophen with this medicine. Too much acetaminophen can be very dangerous. Many nonprescription medicines contain acetaminophen. Always read the labels carefully to avoid taking more acetaminophen. What side effects may I notice from receiving this medicine? Side effects that you should report to your doctor or health care professional as soon as possible: -allergic reactions like skin rash, itching or hives, swelling of the face, lips, or tongue -breathing difficulties, wheezing -confusion -light headedness or fainting spells -severe stomach pain -unusually weak or tired -yellowing of the skin or the whites of the eyes Side effects that usually do not require medical attention (report to your doctor or health care professional if they continue  or are bothersome): -dizziness -drowsiness -nausea -vomiting This list may not describe all possible side effects. Call your doctor for medical advice about side effects. You may report side effects to FDA at 1-800-FDA-1088. Where should I keep my medicine? Keep out of the reach of children. This medicine can be abused. Keep your medicine in a safe place to protect it from theft. Do not share this medicine with anyone. Selling or giving away this medicine is dangerous and against the law. Store at room temperature between 20 and 25 degrees C (68 and 77 degrees F). Keep container tightly closed. Protect from light. This medicine may cause accidental overdose and death if it is taken by other adults, children, or pets. Flush any unused medicine down the toilet to reduce the chance of harm. Do not use the medicine after the expiration date. NOTE: This sheet is a summary. It may not cover all possible information. If you have questions about this medicine, talk to your doctor, pharmacist, or health care provider.  2015, [employer]. (2013-03-01 13:17:35)

## 2014-10-06 NOTE — ED Notes (Signed)
Pt presents with c/o left shoulder and back pain that started 2 days ago. Pt denies any injury to that area. Pt is currently on dialysis, last treatment was Tuesday of this week. Pt hypertensive in triage, hx of same.

## 2014-10-06 NOTE — ED Notes (Signed)
Informed the nurse of elevated blood pressure of 193/133.

## 2014-10-07 ENCOUNTER — Inpatient Hospital Stay (HOSPITAL_COMMUNITY)
Admission: EM | Admit: 2014-10-07 | Discharge: 2014-10-10 | DRG: 193 | Disposition: A | Discharge status: Home / Self Care | Payer: Medicare Other | Attending: Internal Medicine | Admitting: Internal Medicine

## 2014-10-07 ENCOUNTER — Inpatient Hospital Stay (HOSPITAL_COMMUNITY): Payer: Medicare Other

## 2014-10-07 ENCOUNTER — Encounter (HOSPITAL_COMMUNITY): Payer: Self-pay | Admitting: Emergency Medicine

## 2014-10-07 ENCOUNTER — Emergency Department (HOSPITAL_COMMUNITY): Payer: Medicare Other

## 2014-10-07 DIAGNOSIS — M79602 Pain in left arm: Secondary | ICD-10-CM

## 2014-10-07 DIAGNOSIS — R9431 Abnormal electrocardiogram [ECG] [EKG]: Secondary | ICD-10-CM | POA: Diagnosis present

## 2014-10-07 DIAGNOSIS — N186 End stage renal disease: Secondary | ICD-10-CM | POA: Diagnosis present

## 2014-10-07 DIAGNOSIS — I429 Cardiomyopathy, unspecified: Secondary | ICD-10-CM | POA: Diagnosis present

## 2014-10-07 DIAGNOSIS — D849 Immunodeficiency, unspecified: Secondary | ICD-10-CM

## 2014-10-07 DIAGNOSIS — I120 Hypertensive chronic kidney disease with stage 5 chronic kidney disease or end stage renal disease: Secondary | ICD-10-CM | POA: Diagnosis present

## 2014-10-07 DIAGNOSIS — Z9119 Patient's noncompliance with other medical treatment and regimen: Secondary | ICD-10-CM | POA: Diagnosis present

## 2014-10-07 DIAGNOSIS — Z7901 Long term (current) use of anticoagulants: Secondary | ICD-10-CM | POA: Diagnosis not present

## 2014-10-07 DIAGNOSIS — Z992 Dependence on renal dialysis: Secondary | ICD-10-CM

## 2014-10-07 DIAGNOSIS — Z79891 Long term (current) use of opiate analgesic: Secondary | ICD-10-CM | POA: Diagnosis not present

## 2014-10-07 DIAGNOSIS — E119 Type 2 diabetes mellitus without complications: Secondary | ICD-10-CM | POA: Diagnosis present

## 2014-10-07 DIAGNOSIS — I5022 Chronic systolic (congestive) heart failure: Secondary | ICD-10-CM | POA: Diagnosis present

## 2014-10-07 DIAGNOSIS — Z86718 Personal history of other venous thrombosis and embolism: Secondary | ICD-10-CM

## 2014-10-07 DIAGNOSIS — T8612 Kidney transplant failure: Secondary | ICD-10-CM | POA: Diagnosis present

## 2014-10-07 DIAGNOSIS — I2510 Atherosclerotic heart disease of native coronary artery without angina pectoris: Secondary | ICD-10-CM | POA: Diagnosis present

## 2014-10-07 DIAGNOSIS — Y95 Nosocomial condition: Secondary | ICD-10-CM | POA: Diagnosis present

## 2014-10-07 DIAGNOSIS — R0602 Shortness of breath: Secondary | ICD-10-CM

## 2014-10-07 DIAGNOSIS — Z87891 Personal history of nicotine dependence: Secondary | ICD-10-CM

## 2014-10-07 DIAGNOSIS — F411 Generalized anxiety disorder: Secondary | ICD-10-CM | POA: Diagnosis present

## 2014-10-07 DIAGNOSIS — I252 Old myocardial infarction: Secondary | ICD-10-CM

## 2014-10-07 DIAGNOSIS — Z86711 Personal history of pulmonary embolism: Secondary | ICD-10-CM | POA: Diagnosis not present

## 2014-10-07 DIAGNOSIS — J189 Pneumonia, unspecified organism: Principal | ICD-10-CM | POA: Diagnosis present

## 2014-10-07 DIAGNOSIS — Z791 Long term (current) use of non-steroidal anti-inflammatories (NSAID): Secondary | ICD-10-CM | POA: Diagnosis not present

## 2014-10-07 DIAGNOSIS — D631 Anemia in chronic kidney disease: Secondary | ICD-10-CM | POA: Diagnosis present

## 2014-10-07 DIAGNOSIS — N189 Chronic kidney disease, unspecified: Secondary | ICD-10-CM

## 2014-10-07 DIAGNOSIS — R079 Chest pain, unspecified: Secondary | ICD-10-CM | POA: Diagnosis not present

## 2014-10-07 DIAGNOSIS — J209 Acute bronchitis, unspecified: Secondary | ICD-10-CM | POA: Diagnosis present

## 2014-10-07 DIAGNOSIS — D899 Disorder involving the immune mechanism, unspecified: Secondary | ICD-10-CM

## 2014-10-07 DIAGNOSIS — I10 Essential (primary) hypertension: Secondary | ICD-10-CM | POA: Diagnosis not present

## 2014-10-07 HISTORY — DX: Anemia, unspecified: D64.9

## 2014-10-07 LAB — BASIC METABOLIC PANEL
ANION GAP: 13 (ref 5–15)
BUN: 53 mg/dL — ABNORMAL HIGH (ref 6–23)
CO2: 22 mmol/L (ref 19–32)
CREATININE: 14.42 mg/dL — AB (ref 0.50–1.35)
Calcium: 7.8 mg/dL — ABNORMAL LOW (ref 8.4–10.5)
Chloride: 99 mmol/L (ref 96–112)
GFR calc non Af Amer: 3 mL/min — ABNORMAL LOW (ref >90)
GFR, EST AFRICAN AMERICAN: 4 mL/min — AB (ref >90)
Glucose, Bld: 88 mg/dL (ref 70–99)
Potassium: 4.8 mmol/L (ref 3.5–5.1)
Sodium: 134 mmol/L — ABNORMAL LOW (ref 135–145)

## 2014-10-07 LAB — MRSA PCR SCREENING: MRSA by PCR: NEGATIVE

## 2014-10-07 LAB — CBC WITH DIFFERENTIAL/PLATELET
BASOS PCT: 0 % (ref 0–1)
Basophils Absolute: 0 10*3/uL (ref 0.0–0.1)
Eosinophils Absolute: 0.1 10*3/uL (ref 0.0–0.7)
Eosinophils Relative: 2 % (ref 0–5)
HEMATOCRIT: 24.9 % — AB (ref 39.0–52.0)
Hemoglobin: 7.9 g/dL — ABNORMAL LOW (ref 13.0–17.0)
LYMPHS PCT: 24 % (ref 12–46)
Lymphs Abs: 1.6 10*3/uL (ref 0.7–4.0)
MCH: 25.4 pg — ABNORMAL LOW (ref 26.0–34.0)
MCHC: 31.7 g/dL (ref 30.0–36.0)
MCV: 80.1 fL (ref 78.0–100.0)
Monocytes Absolute: 0.5 10*3/uL (ref 0.1–1.0)
Monocytes Relative: 7 % (ref 3–12)
NEUTROS ABS: 4.6 10*3/uL (ref 1.7–7.7)
NEUTROS PCT: 67 % (ref 43–77)
Platelets: 173 10*3/uL (ref 150–400)
RBC: 3.11 MIL/uL — ABNORMAL LOW (ref 4.22–5.81)
RDW: 18.9 % — AB (ref 11.5–15.5)
WBC: 6.8 10*3/uL (ref 4.0–10.5)

## 2014-10-07 LAB — TROPONIN I
Troponin I: 0.05 ng/mL — ABNORMAL HIGH (ref <0.031)
Troponin I: 0.05 ng/mL — ABNORMAL HIGH (ref <0.031)

## 2014-10-07 LAB — DRUG SCREEN PANEL (SERUM)

## 2014-10-07 LAB — GLUCOSE, CAPILLARY: Glucose-Capillary: 106 mg/dL — ABNORMAL HIGH (ref 70–99)

## 2014-10-07 MED ORDER — CARVEDILOL 25 MG PO TABS
25.0000 mg | ORAL_TABLET | Freq: Three times a day (TID) | ORAL | Status: DC
  Administered 2014-10-07 (×2): 25 mg via ORAL
  Filled 2014-10-07 (×6): qty 1

## 2014-10-07 MED ORDER — LIDOCAINE-PRILOCAINE 2.5-2.5 % EX CREA
1.0000 "application " | TOPICAL_CREAM | CUTANEOUS | Status: DC | PRN
  Filled 2014-10-07: qty 5

## 2014-10-07 MED ORDER — IOHEXOL 350 MG/ML SOLN
80.0000 mL | Freq: Once | INTRAVENOUS | Status: AC | PRN
  Administered 2014-10-07: 80 mL via INTRAVENOUS

## 2014-10-07 MED ORDER — FERRIC CITRATE 1 GM 210 MG(FE) PO TABS: 210.0000 mg | ORAL_TABLET | Freq: Three times a day (TID) | ORAL | Status: DC

## 2014-10-07 MED ORDER — VANCOMYCIN HCL 10 G IV SOLR
1500.0000 mg | Freq: Once | INTRAVENOUS | Status: AC
  Administered 2014-10-07: 1500 mg via INTRAVENOUS
  Filled 2014-10-07: qty 1500

## 2014-10-07 MED ORDER — VANCOMYCIN HCL IN DEXTROSE 750-5 MG/150ML-% IV SOLN
750.0000 mg | INTRAVENOUS | Status: DC
  Filled 2014-10-07: qty 150

## 2014-10-07 MED ORDER — HYDRALAZINE HCL 50 MG PO TABS: 100.0000 mg | ORAL_TABLET | Freq: Three times a day (TID) | ORAL | Status: DC

## 2014-10-07 MED ORDER — HYDROMORPHONE HCL 1 MG/ML IJ SOLN
1.0000 mg | Freq: Once | INTRAMUSCULAR | Status: AC
  Administered 2014-10-07: 1 mg via INTRAVENOUS
  Filled 2014-10-07: qty 1

## 2014-10-07 MED ORDER — PANTOPRAZOLE SODIUM 40 MG PO TBEC
40.0000 mg | DELAYED_RELEASE_TABLET | Freq: Every day | ORAL | Status: DC
Start: 2014-10-07 — End: 2014-10-10
  Administered 2014-10-07 – 2014-10-10 (×4): 40 mg via ORAL
  Filled 2014-10-07 (×3): qty 1

## 2014-10-07 MED ORDER — HEPARIN SODIUM (PORCINE) 1000 UNIT/ML DIALYSIS: 1000.0000 [IU] | INTRAMUSCULAR | Status: DC | PRN

## 2014-10-07 MED ORDER — AMLODIPINE BESYLATE 10 MG PO TABS
10.0000 mg | ORAL_TABLET | Freq: Every day | ORAL | Status: DC
  Administered 2014-10-08 – 2014-10-09 (×2): 10 mg via ORAL
  Filled 2014-10-07 (×3): qty 1

## 2014-10-07 MED ORDER — DEXTROSE 5 % IV SOLN: 2.0000 g | INTRAVENOUS | Status: DC

## 2014-10-07 MED ORDER — DEXTROSE 5 % IV SOLN
2.0000 g | Freq: Once | INTRAVENOUS | Status: AC
  Administered 2014-10-07: 2 g via INTRAVENOUS
  Filled 2014-10-07: qty 2

## 2014-10-07 MED ORDER — IPRATROPIUM-ALBUTEROL 0.5-2.5 (3) MG/3ML IN SOLN
3.0000 mL | Freq: Once | RESPIRATORY_TRACT | Status: DC
  Filled 2014-10-07: qty 3

## 2014-10-07 MED ORDER — ONDANSETRON 4 MG PO TBDP
4.0000 mg | ORAL_TABLET | Freq: Once | ORAL | Status: AC
  Administered 2014-10-07: 4 mg via ORAL
  Filled 2014-10-07: qty 1

## 2014-10-07 MED ORDER — VANCOMYCIN HCL IN DEXTROSE 750-5 MG/150ML-% IV SOLN: 750.0000 mg | INTRAVENOUS | Status: DC

## 2014-10-07 MED ORDER — NIFEDIPINE ER 30 MG PO TB24
30.0000 mg | ORAL_TABLET | Freq: Every day | ORAL | Status: DC
  Administered 2014-10-07: 30 mg via ORAL
  Filled 2014-10-07: qty 1

## 2014-10-07 MED ORDER — PREDNISONE 10 MG PO TABS
10.0000 mg | ORAL_TABLET | Freq: Every day | ORAL | Status: DC
  Administered 2014-10-08 – 2014-10-10 (×3): 10 mg via ORAL
  Filled 2014-10-07 (×4): qty 1

## 2014-10-07 MED ORDER — ALTEPLASE 2 MG IJ SOLR
2.0000 mg | Freq: Once | INTRAMUSCULAR | Status: DC | PRN
  Filled 2014-10-07: qty 2

## 2014-10-07 MED ORDER — NIFEDIPINE ER 30 MG PO TB24
30.0000 mg | ORAL_TABLET | Freq: Every day | ORAL | Status: DC
  Administered 2014-10-08: 30 mg via ORAL
  Filled 2014-10-07 (×2): qty 1

## 2014-10-07 MED ORDER — DOXERCALCIFEROL 4 MCG/2ML IV SOLN
2.5000 ug | INTRAVENOUS | Status: DC
  Administered 2014-10-08: 2.5 ug via INTRAVENOUS
  Filled 2014-10-07: qty 2

## 2014-10-07 MED ORDER — LIDOCAINE HCL (PF) 1 % IJ SOLN: 5.0000 mL | INTRAMUSCULAR | Status: DC | PRN

## 2014-10-07 MED ORDER — TACROLIMUS 1 MG PO CAPS
2.0000 mg | ORAL_CAPSULE | Freq: Two times a day (BID) | ORAL | Status: DC
  Filled 2014-10-07 (×2): qty 2

## 2014-10-07 MED ORDER — TACROLIMUS 1 MG PO CAPS
1.0000 mg | ORAL_CAPSULE | Freq: Two times a day (BID) | ORAL | Status: DC
  Administered 2014-10-07: 1 mg via ORAL
  Filled 2014-10-07 (×3): qty 1

## 2014-10-07 MED ORDER — SEVELAMER CARBONATE 800 MG PO TABS: 1600.0000 mg | ORAL_TABLET | Freq: Three times a day (TID) | ORAL | Status: DC

## 2014-10-07 MED ORDER — DEXTROSE 5 % IV SOLN: 1.0000 g | Freq: Once | INTRAVENOUS | Status: DC

## 2014-10-07 MED ORDER — SODIUM CHLORIDE 0.9 % IV SOLN: 100.0000 mL | INTRAVENOUS | Status: DC | PRN

## 2014-10-07 MED ORDER — CLONIDINE HCL 0.3 MG/24HR TD PTWK
0.3000 mg | MEDICATED_PATCH | TRANSDERMAL | Status: DC
  Filled 2014-10-07: qty 1

## 2014-10-07 MED ORDER — KETOROLAC TROMETHAMINE 30 MG/ML IJ SOLN
30.0000 mg | Freq: Three times a day (TID) | INTRAMUSCULAR | Status: DC | PRN
  Filled 2014-10-07: qty 1

## 2014-10-07 MED ORDER — NEPRO/CARBSTEADY PO LIQD
237.0000 mL | ORAL | Status: DC | PRN
  Filled 2014-10-07: qty 237

## 2014-10-07 MED ORDER — WARFARIN SODIUM 10 MG PO TABS
10.0000 mg | ORAL_TABLET | Freq: Once | ORAL | Status: AC
  Administered 2014-10-07: 10 mg via ORAL
  Filled 2014-10-07 (×2): qty 1

## 2014-10-07 MED ORDER — ALPRAZOLAM 0.25 MG PO TABS
0.2500 mg | ORAL_TABLET | Freq: Two times a day (BID) | ORAL | Status: DC | PRN
  Administered 2014-10-07 – 2014-10-08 (×3): 0.25 mg via ORAL
  Filled 2014-10-07 (×3): qty 1

## 2014-10-07 MED ORDER — SEVELAMER CARBONATE 800 MG PO TABS
1600.0000 mg | ORAL_TABLET | Freq: Three times a day (TID) | ORAL | Status: DC
  Administered 2014-10-07 – 2014-10-10 (×7): 1600 mg via ORAL
  Filled 2014-10-07 (×12): qty 2

## 2014-10-07 MED ORDER — AMLODIPINE BESYLATE 5 MG PO TABS
10.0000 mg | ORAL_TABLET | Freq: Every day | ORAL | Status: DC
  Administered 2014-10-07: 10 mg via ORAL
  Filled 2014-10-07: qty 2

## 2014-10-07 MED ORDER — CINACALCET HCL 30 MG PO TABS
30.0000 mg | ORAL_TABLET | Freq: Every day | ORAL | Status: DC
  Administered 2014-10-08 – 2014-10-10 (×3): 30 mg via ORAL
  Filled 2014-10-07 (×5): qty 1

## 2014-10-07 MED ORDER — CEFEPIME HCL 1 G IJ SOLR
1.0000 g | Freq: Three times a day (TID) | INTRAMUSCULAR | Status: DC
  Administered 2014-10-07: 1 g via INTRAVENOUS

## 2014-10-07 MED ORDER — WARFARIN - PHARMACIST DOSING INPATIENT: Freq: Every day | Status: DC

## 2014-10-07 MED ORDER — SODIUM CHLORIDE 0.9 % IV SOLN: INTRAVENOUS | Status: DC

## 2014-10-07 MED ORDER — HEPARIN SODIUM (PORCINE) 1000 UNIT/ML DIALYSIS: 20.0000 [IU]/kg | Freq: Once | INTRAMUSCULAR | Status: DC

## 2014-10-07 MED ORDER — ATORVASTATIN CALCIUM 40 MG PO TABS
40.0000 mg | ORAL_TABLET | Freq: Every day | ORAL | Status: DC
  Administered 2014-10-07 – 2014-10-09 (×3): 40 mg via ORAL
  Filled 2014-10-07 (×4): qty 1

## 2014-10-07 MED ORDER — SULFAMETHOXAZOLE-TRIMETHOPRIM 400-80 MG PO TABS
1.0000 | ORAL_TABLET | ORAL | Status: DC
  Filled 2014-10-07: qty 1

## 2014-10-07 MED ORDER — VANCOMYCIN HCL IN DEXTROSE 750-5 MG/150ML-% IV SOLN
750.0000 mg | Freq: Once | INTRAVENOUS | Status: AC
  Administered 2014-10-07: 750 mg via INTRAVENOUS
  Filled 2014-10-07: qty 150

## 2014-10-07 MED ORDER — PENTAFLUOROPROP-TETRAFLUOROETH EX AERO: 1.0000 "application " | INHALATION_SPRAY | CUTANEOUS | Status: DC | PRN

## 2014-10-07 NOTE — ED Provider Notes (Signed)
CSN: [id number]     Arrival date & time 10/07/14  0133 History  This chart was scribed for Frank Sorrow, MD by Delphia Grates, ED Scribe. This patient was seen in room D32C/D32C and the patient's care was started at 3:32 AM.     Chief Complaint  Patient presents with  . Chest Pain    Patient is a 49 y.o. male presenting with chest pain. The history is provided by the patient. No language interpreter was used.  Chest Pain Pain location:  L chest Pain radiates to:  L shoulder Pain severity:  Moderate Onset quality:  Sudden Duration:  1 day Timing:  Constant Progression:  Unchanged Chronicity:  New Relieved by:  None tried Worsened by:  Nothing tried Ineffective treatments:  None tried Associated symptoms: back pain, cough, headache and shortness of breath   Associated symptoms: no abdominal pain, no dizziness, no fever, no nausea and not vomiting    HPI Comments: Frank [name] is a 49 y.o. male, with history of HTN, DM, ESRD (diaysis for the past 3 years; every Tues, Th, Sat), who presents to the Emergency Department complaining of constant, 7/10, left sided chest pain onset yesterday at approximately 1200 (10/06/14). He reports the pain originated in the left shoulder and was evaluated at [facility] ED for left shoulder pain as well as lower back pain, and states he was not having chest pain at that time. Patient states the chest pain woke him from sleep. He describes this pain as sharp, stating he could also feel the pain in his back. He reports pain is made worse by taking breaths. He also notes associated chills, congestion, SOB, productive cough, generalized headache, and swelling to the BLE. No treatments tried PTA. Patient has been on dialysis for the past 3 years, and states he does not produce urine.He reports his last dialysis was 3 days ago on October 04, 2014. He reports history of PE/DVT October 2015, he is currently on Coumadin. He also notes an elevation in BP whenever he is in pain.  Patient denies fever, rhinorrhea, sore throat, visual disturbances, abdominal pain, nausea, vomiting, diarrhea, dysuria, neck pain, rash, dizziness, or light-headedness.     Past Medical History  Diagnosis Date  . Hypertension   . Depression   . Complication of anesthesia     itching, sore throat  . Diabetes mellitus without complication     No history per patient, but remains under history as A1c would not be accurate given on dialysis  . Shortness of breath   . Anxiety   . ESRD (end stage renal disease)     due to HTN per patient, followed at [facility], s/p failed kidney transplant - dialysis Tue, Th, Sat  . Renal insufficiency    Past Surgical History  Procedure Laterality Date  . Kidney receipient  2006    failed and started HD in March 2014  . Capd insertion    . Capd removal    . Left heart catheterization with coronary angiogram N/A 09/02/2014    Procedure: LEFT HEART CATHETERIZATION WITH CORONARY ANGIOGRAM;  Surgeon: [name], MD;  Location: [facility] CATH LAB;  Service: Cardiovascular;  Laterality: N/A;   No family history on file. History  Substance Use Topics  . Smoking status: Former Smoker -- 1.00 packs/day for 1 years    Types: Cigarettes  . Smokeless tobacco: Never Used     Comment: quit Jan 2014  . Alcohol Use: No    Review of Systems  Constitutional: Positive for chills. Negative for fever.  HENT: Positive for congestion and rhinorrhea. Negative for sore throat.   Eyes: Negative for visual disturbance.  Respiratory: Positive for cough and shortness of breath.   Cardiovascular: Positive for chest pain and leg swelling.  Gastrointestinal: Negative for nausea, vomiting, abdominal pain and diarrhea.  Genitourinary: Negative for dysuria.  Musculoskeletal: Positive for back pain. Negative for neck pain.  Skin: Negative for rash.  Neurological: Positive for headaches. Negative for dizziness and light-headedness.  Hematological: Bruises/bleeds easily.   Psychiatric/Behavioral: Negative for confusion.      Allergies  Ferrlecit and Darvocet  Home Medications   Prior to Admission medications   Medication Sig Start Date End Date Taking? Authorizing Provider  ALPRAZolam (XANAX) 0.25 MG tablet Take 0.25 mg by mouth daily as needed. For sleep 06/25/14   Historical Provider, MD  amLODipine (NORVASC) 5 MG tablet Take 10 mg by mouth daily.    Historical Provider, MD  atorvastatin (LIPITOR) 40 MG tablet Take 1 tablet (40 mg total) by mouth daily at 6 PM. Patient not taking: Reported on 09/19/2014 09/02/14   Barton Dubois, MD  carvedilol (COREG) 25 MG tablet Take 25 mg by mouth 3 (three) times daily.     Historical Provider, MD  cinacalcet (SENSIPAR) 30 MG tablet Take 30 mg by mouth daily.    Historical Provider, MD  cloNIDine (CATAPRES - DOSED IN MG/24 HR) 0.3 mg/24hr patch Place 1 patch onto the skin. Every 7 days 08/08/14   Historical Provider, MD  Ferric Citrate 210 MG TABS Take 210 mg by mouth 3 (three) times daily with meals. Take 1 with each meal and 1 with a snack. 08/08/14   Historical Provider, MD  gentamicin cream (GARAMYCIN) 0.1 % Apply to [place] site 06/13/14   Historical Provider, MD  hydrALAZINE (APRESOLINE) 25 MG tablet Take 100 mg by mouth 3 (three) times daily.     Historical Provider, MD  hydrocortisone 1 % lotion Apply to site with itch as needed twice daily 06/13/14   Historical Provider, MD  levalbuterol [facility] HFA) 45 MCG/ACT inhaler Inhale 2 puffs into the lungs every 4 (four) hours as needed for wheezing or shortness of breath. 10/02/14   Allie Bossier, MD  methocarbamol (ROBAXIN-750) 750 MG tablet Take 1 tablet (750 mg total) by mouth every 6 (six) hours as needed for muscle spasms. 09/19/14   Linton Flemings, MD  minoxidil (LONITEN) 2.5 MG tablet Take 1 tablet (2.5 mg total) by mouth daily. Patient taking differently: Take 5 mg by mouth daily.  04/06/14   Delfina Redwood, MD  nabumetone (RELAFEN) 500 MG tablet Take 500 mg by  mouth 2 (two) times daily. 09/12/14 11/11/14  Historical Provider, MD  NIFEdipine (PROCARDIA-XL/ADALAT CC) 30 MG 24 hr tablet Take 1 tablet (30 mg total) by mouth daily. Patient not taking: Reported on 09/19/2014 04/06/14   Delfina Redwood, MD  omeprazole (PRILOSEC) 20 MG capsule Take 20 mg by mouth daily. 07/01/13   Historical Provider, MD  oxyCODONE (ROXICODONE) 5 MG immediate release tablet Take 1 tablet (5 mg total) by mouth every 4 (four) hours as needed for severe pain. 09/27/63   Delora Fuel, MD  polyethylene glycol [facility]) packet Take 17 g by mouth daily. For constipation Patient taking differently: Take 17 g by mouth daily as needed for mild constipation. For constipation 05/12/14   Domenic Polite, MD  predniSONE (DELTASONE) 10 MG tablet Take 10 mg by mouth daily with breakfast.    Historical Provider,  MD  sevelamer carbonate (RENVELA) 800 MG tablet Take 1,600 mg by mouth 3 (three) times daily with meals. 12/27/13 12/27/14  Historical Provider, MD  sulfamethoxazole-trimethoprim (BACTRIM,SEPTRA) 400-80 MG per tablet Take 1 tablet by mouth every Monday, Wednesday, and Friday.    Historical Provider, MD  tacrolimus (PROGRAF) 1 MG capsule Take 2 mg by mouth 2 (two) times daily. 07/14/14   Historical Provider, MD  traMADol (ULTRAM) 50 MG tablet Take 2 tablets (100 mg total) by mouth every 6 (six) hours as needed. Patient not taking: Reported on 09/14/2014 05/05/14   Rolland Porter, MD  warfarin (COUMADIN) 2 MG tablet Take 2 mg by mouth daily. Take along with warfarin 41m to equal 719m    Historical Provider, MD  warfarin (COUMADIN) 5 MG tablet Take 1.5 tablets (7.5 mg total) by mouth one time only at 6 PM. Take 22m80maily at 6pm Patient taking differently: Take 5 mg by mouth daily.  09/02/14   CarBarton DuboisD  warfarin (COUMADIN) 5 MG tablet Take 5 mg by mouth daily. Take along with Warfarin 2mg8m equal 7mg 74mHistorical Provider, MD   Triage Vitals: BP 185/126 mmHg  Pulse 81  Temp(Src) 98.4 F (36.9  C) (Oral)  Resp 24  SpO2 97%  Physical Exam  Constitutional: He is oriented to person, place, and time. He appears well-developed and well-nourished. No distress.  HENT:  Head: Normocephalic and atraumatic.  Moist mucous membranes.  Eyes: Conjunctivae and EOM are normal.  Pupils normal, sclera clear, eyes track normally.  Neck: Neck supple. No tracheal deviation present.  Cardiovascular: Normal rate, regular rhythm and normal heart sounds.   No murmur heard. Pulmonary/Chest: Effort normal and breath sounds normal. No respiratory distress.  Port in right upper chest area.  Abdominal: Soft. Bowel sounds are normal. There is no tenderness.  Musculoskeletal: Normal range of motion. He exhibits edema.  Swelling in the BLE  Neurological: He is alert and oriented to person, place, and time.  Skin: Skin is warm and dry.  Psychiatric: He has a normal mood and affect. His behavior is normal.  Nursing note and vitals reviewed.   ED Course  Procedures (including critical care time)  DIAGNOSTIC STUDIES: Oxygen Saturation is 97% on room air, adequate by my interpretation.    COORDINATION OF CARE: At 0339 Discussed treatment plan with patient which includes imaging. Patient agrees.   Labs Review Labs Reviewed  CBC WITH DIFFERENTIAL/PLATELET - Abnormal; Notable for the following:    RBC 3.11 (*)    Hemoglobin 7.9 (*)    HCT 24.9 (*)    MCH 25.4 (*)    RDW 18.9 (*)    All other components within normal limits  BASIC METABOLIC PANEL - Abnormal; Notable for the following:    Sodium 134 (*)    BUN 53 (*)    Creatinine, Ser 14.42 (*)    Calcium 7.8 (*)    GFR calc non Af Amer 3 (*)    GFR calc Af Amer 4 (*)    All other components within normal limits  CULTURE, BLOOD (ROUTINE X 2)  CULTURE, BLOOD (ROUTINE X 2)   Results for orders placed or performed during the hospital encounter of 10/07/14  CBC with Differential/Platelet  Result Value Ref Range   WBC 6.8 4.0 - 10.5 K/uL   RBC  3.11 (L) 4.22 - 5.81 MIL/uL   Hemoglobin 7.9 (L) 13.0 - 17.0 g/dL   HCT 24.9 (L) 39.0 - 52.0 %  MCV 80.1 78.0 - 100.0 fL   MCH 25.4 (L) 26.0 - 34.0 pg   MCHC 31.7 30.0 - 36.0 g/dL   RDW 18.9 (H) 11.5 - 15.5 %   Platelets 173 150 - 400 K/uL   Neutrophils Relative % 67 43 - 77 %   Lymphocytes Relative 24 12 - 46 %   Monocytes Relative 7 3 - 12 %   Eosinophils Relative 2 0 - 5 %   Basophils Relative 0 0 - 1 %   Neutro Abs 4.6 1.7 - 7.7 K/uL   Lymphs Abs 1.6 0.7 - 4.0 K/uL   Monocytes Absolute 0.5 0.1 - 1.0 K/uL   Eosinophils Absolute 0.1 0.0 - 0.7 K/uL   Basophils Absolute 0.0 0.0 - 0.1 K/uL   WBC Morphology ATYPICAL LYMPHOCYTES   Basic metabolic panel  Result Value Ref Range   Sodium 134 (L) 135 - 145 mmol/L   Potassium 4.8 3.5 - 5.1 mmol/L   Chloride 99 96 - 112 mmol/L   CO2 22 19 - 32 mmol/L   Glucose, Bld 88 70 - 99 mg/dL   BUN 53 (H) 6 - 23 mg/dL   Creatinine, Ser 14.42 (H) 0.50 - 1.35 mg/dL   Calcium 7.8 (L) 8.4 - 10.5 mg/dL   GFR calc non Af Amer 3 (L) >90 mL/min   GFR calc Af Amer 4 (L) >90 mL/min   Anion gap 13 5 - 15     Imaging Review Dg Chest 2 View  10/07/2014   CLINICAL DATA:  Chest pain and dyspnea for 2 days.  EXAM: CHEST  2 VIEW  COMPARISON:  None.  FINDINGS: There is a right jugular central line extending into the right atrium. There is moderate unchanged cardiomegaly. There is mild vascular and interstitial prominence which likely represents a degree of congestive heart failure. There are no effusions. There is no airspace consolidation.  IMPRESSION: Mild congestive heart failure.   Electronically Signed   By: Andreas Newport M.D.   On: 10/07/2014 02:58   Ct Angio Chest Pe W/cm &/or Wo Cm  10/07/2014   CLINICAL DATA:  Dyspnea for 4 days with pain radiating into left upper extremity.  EXAM: CT ANGIOGRAPHY CHEST WITH CONTRAST  TECHNIQUE: Multidetector CT imaging of the chest was performed using the standard protocol during bolus administration of intravenous  contrast. Multiplanar CT image reconstructions and MIPs were obtained to evaluate the vascular anatomy.  CONTRAST:  96m OMNIPAQUE IOHEXOL 350 MG/ML SOLN  COMPARISON:  08/30/2014  FINDINGS: The pulmonary vasculature is well opacified with no evidence of pulmonary embolism. There is mild aneurysmal enlargement of the proximal descending aorta, measuring 3.7 cm. The ascending aorta is normal in caliber. The mid the distal portions of the descending aorta are normal in caliber. This is unchanged from 08/30/2014.  There are no effusions. There is no mediastinal or hilar adenopathy. Esophagus appears unremarkable.  There is airspace opacity in the right upper lobe which may represent infiltrate or aspiration. The left lung is clear.  Upper abdomen is remarkable for atrophic kidneys and a large liver.  Review of the MIP images confirms the above findings.  IMPRESSION: 1. Negative for pulmonary embolism 2. Mild aneurysmal enlargement of the proximal descending thoracic aorta. Recommend annual imaging followup by CTA or MRA. This recommendation follows 2010 ACCF/AHA/AATS/ACR/ASA/SCA/SCAI/SIR/STS/SVM Guidelines for the Diagnosis and Management of Patients with Thoracic Aortic Disease. Circulation.2010; 121: eX480-X6553. Right upper lobe infiltrate, possibly infectious.   Electronically Signed   By: DAndreas Newport  M.D.   On: 10/07/2014 06:22     EKG Interpretation   Date/Time:  Friday October 07 2014 01:42:26 EDT Ventricular Rate:  74 PR Interval:  176 QRS Duration: 84 QT Interval:  460 QTC Calculation: 510 R Axis:   -28 Text Interpretation:  Normal sinus rhythm Possible Left atrial enlargement  Left ventricular hypertrophy with repolarization abnormality Prolonged QT  Abnormal ECG Confirmed by Kong Packett  MD, Chonte Ricke ([id number]) on 10/07/2014  1:46:33 AM      MDM   Final diagnoses:  Chest pain  HCAP (healthcare-associated pneumonia)  SOB (shortness of breath)  ESRD on hemodialysis    Patient dialysis  patient. Patient last dialyzed on Tuesday. Missed dialysis on Thursday Kizzie was at [facility]. Patient returned here with the chest pain shortness of breath chest pain worse with deep breaths. Radiated to back. CT angios done to rule out pulmonary embolus is not present. But CT chest dated the raise concerns for infectious infiltrate in the right upper lobe. The blood cultures Timmothy Sours will treat conservatively healthcare acquired pneumonia antibiotics ordered.  In addition patient did down record chest x-ray did show some mild pulmonary edema not really present on CT angios. Nephrology will be notified that a dialysis patient will be coming in. Admission will be required. Based on patient's electrolyte numbers dialysis not immediately required. Patient is showing some signs of some fluid retention.  I personally performed the services described in this documentation, which was scribed in my presence. The recorded information has been reviewed and is accurate.     Frank Sorrow, MD 10/07/14 [id number]

## 2014-10-07 NOTE — Progress Notes (Signed)
ANTICOAGULATION CONSULT NOTE - Initial Consult  Pharmacy Consult for warfarin Indication: VTE treatment  Allergies  Allergen Reactions  . Ferrlecit [Na Ferric Gluc Cplx In Sucrose] Shortness Of Breath, Swelling and Other (See Comments)    Swelling in throat  . Darvocet [Propoxyphene N-Acetaminophen] Hives    Patient Measurements: Weight: 179 lb 7.3 oz (81.4 kg)   Vital Signs: Temp: 98.8 F (37.1 C) (03/18 0736) Temp Source: Oral (03/18 0736) BP: 196/145 mmHg (03/18 1209) Pulse Rate: 84 (03/18 1209)  Labs:  Recent Labs  10/06/14 0532 10/06/14 0813 10/07/14 0220  HGB 8.3*  --  7.9*  HCT 26.3*  --  24.9*  PLT 165  --  173  LABPROT 18.6*  --   --   INR 1.54*  --   --   CREATININE 13.06*  --  14.42*  TROPONINI 0.09* 0.08*  --     Estimated Creatinine Clearance: 6.9 mL/min (by C-G formula based on Cr of 14.42).   Medical History: Past Medical History  Diagnosis Date  . Hypertension   . Depression   . Complication of anesthesia     itching, sore throat  . Diabetes mellitus without complication     No history per patient, but remains under history as A1c would not be accurate given on dialysis  . Shortness of breath   . Anxiety   . ESRD (end stage renal disease)     due to HTN per patient, followed at [facility], s/p failed kidney transplant - dialysis Tue, Th, Sat  . Renal insufficiency     Assessment: 56 YOM with history of PE in 04/2014 on warfarin. INR yesterday morning was low at 1.54 Home warfarin dose 25m daily. Hgb 7.9, plts 173- no bleeding noted.   Goal of Therapy:  INR 2-3 Monitor platelets by anticoagulation protocol: Yes   Plan:  -warfarin 148mpo x1 tonight -daily PT/INR -follow s/s bleeding  Elven Laboy D. Gaspard Isbell, PharmD, BCPS Clinical Pharmacist Pager: [phone]/18/2016 12:50 PM

## 2014-10-07 NOTE — ED Notes (Signed)
Patient transported to X-ray 

## 2014-10-07 NOTE — Consult Note (Signed)
Indication for Consultation:  Management of ESRD/hemodialysis; anemia, hypertension/volume and secondary hyperparathyroidism  HPI: Frank [name] is a 49 y.o. male with a history of Diabetes Type 2, hypertension, PE in 04/2014 on Coumadin, depression, anxiety, and ESRD with failed renal transplant, now on dialysis at the [facility] in [facility] who presented to the ED last night with complaints of chest pain and sob. He missed dialysis yesterday and states he only ran 1.5 hours on Tuesday because he wasn't feeling well. He had been in he ED yesterday morning for complaints of left shoulder pain, he was discharged from the ED and was supposed to go to outpt HD. He was not having any chest pain when he went to bed last night after being DCd from the ED but he awoke to a sharp pain and sob. He has been having a productive cough for a few days, he also reports he started noticing bilat LE edema yesterday. He was admitted last week for cough and n/v - discharged last Sunday but reports he was feeling well until Tuesday.  Will arrange for HD today and continue to follow while admitted. Repeated nonadherence   Past Medical History  Diagnosis Date  . Hypertension   . Depression   . Complication of anesthesia     itching, sore throat  . Diabetes mellitus without complication     No history per patient, but remains under history as A1c would not be accurate given on dialysis  . Shortness of breath   . Anxiety   . ESRD (end stage renal disease)     due to HTN per patient, followed at [facility], s/p failed kidney transplant - dialysis Tue, Th, Sat  . Renal insufficiency    Past Surgical History  Procedure Laterality Date  . Kidney receipient  2006    failed and started HD in March 2014  . Capd insertion    . Capd removal    . Left heart catheterization with coronary angiogram N/A 09/02/2014    Procedure: LEFT HEART CATHETERIZATION WITH CORONARY ANGIOGRAM;  Surgeon: [name], MD;   Location: [facility] CATH LAB;  Service: Cardiovascular;  Laterality: N/A;   No family history on file. Social History:  reports that he has quit smoking. His smoking use included Cigarettes. He has a 1 pack-year smoking history. He has never used smokeless tobacco. He reports that he does not drink alcohol or use illicit drugs. Allergies  Allergen Reactions  . Ferrlecit [Na Ferric Gluc Cplx In Sucrose] Shortness Of Breath, Swelling and Other (See Comments)    Swelling in throat  . Darvocet [Propoxyphene N-Acetaminophen] Hives   Prior to Admission medications   Medication Sig Start Date End Date Taking? Authorizing Provider  ALPRAZolam (XANAX) 0.25 MG tablet Take 0.25 mg by mouth daily as needed. For sleep 06/25/14   Historical Provider, MD  amLODipine (NORVASC) 5 MG tablet Take 10 mg by mouth daily.    Historical Provider, MD  atorvastatin (LIPITOR) 40 MG tablet Take 1 tablet (40 mg total) by mouth daily at 6 PM. Patient not taking: Reported on 09/19/2014 09/02/14   Barton Dubois, MD  carvedilol (COREG) 25 MG tablet Take 25 mg by mouth 3 (three) times daily.     Historical Provider, MD  cinacalcet (SENSIPAR) 30 MG tablet Take 30 mg by mouth daily.    Historical Provider, MD  cloNIDine (CATAPRES - DOSED IN MG/24 HR) 0.3 mg/24hr patch Place 1 patch onto the skin. Every 7 days 08/08/14  Historical Provider, MD  Ferric Citrate 210 MG TABS Take 210 mg by mouth 3 (three) times daily with meals. Take 1 with each meal and 1 with a snack. 08/08/14   Historical Provider, MD  gentamicin cream (GARAMYCIN) 0.1 % Apply to [place] site 06/13/14   Historical Provider, MD  hydrALAZINE (APRESOLINE) 25 MG tablet Take 100 mg by mouth 3 (three) times daily.     Historical Provider, MD  hydrocortisone 1 % lotion Apply to site with itch as needed twice daily 06/13/14   Historical Provider, MD  levalbuterol [facility] HFA) 45 MCG/ACT inhaler Inhale 2 puffs into the lungs every 4 (four) hours as needed for wheezing or shortness  of breath. 10/02/14   Allie Bossier, MD  methocarbamol (ROBAXIN-750) 750 MG tablet Take 1 tablet (750 mg total) by mouth every 6 (six) hours as needed for muscle spasms. 09/19/14   Linton Flemings, MD  minoxidil (LONITEN) 2.5 MG tablet Take 1 tablet (2.5 mg total) by mouth daily. Patient taking differently: Take 5 mg by mouth daily.  04/06/14   Delfina Redwood, MD  nabumetone (RELAFEN) 500 MG tablet Take 500 mg by mouth 2 (two) times daily. 09/12/14 11/11/14  Historical Provider, MD  NIFEdipine (PROCARDIA-XL/ADALAT CC) 30 MG 24 hr tablet Take 1 tablet (30 mg total) by mouth daily. Patient not taking: Reported on 09/19/2014 04/06/14   Delfina Redwood, MD  omeprazole (PRILOSEC) 20 MG capsule Take 20 mg by mouth daily. 07/01/13   Historical Provider, MD  oxyCODONE (ROXICODONE) 5 MG immediate release tablet Take 1 tablet (5 mg total) by mouth every 4 (four) hours as needed for severe pain. 03/24/82   Delora Fuel, MD  polyethylene glycol [facility]) packet Take 17 g by mouth daily. For constipation Patient taking differently: Take 17 g by mouth daily as needed for mild constipation. For constipation 05/12/14   Domenic Polite, MD  predniSONE (DELTASONE) 10 MG tablet Take 10 mg by mouth daily with breakfast.    Historical Provider, MD  sevelamer carbonate (RENVELA) 800 MG tablet Take 1,600 mg by mouth 3 (three) times daily with meals. 12/27/13 12/27/14  Historical Provider, MD  sulfamethoxazole-trimethoprim (BACTRIM,SEPTRA) 400-80 MG per tablet Take 1 tablet by mouth every Monday, Wednesday, and Friday.    Historical Provider, MD  tacrolimus (PROGRAF) 1 MG capsule Take 2 mg by mouth 2 (two) times daily. 07/14/14   Historical Provider, MD  traMADol (ULTRAM) 50 MG tablet Take 2 tablets (100 mg total) by mouth every 6 (six) hours as needed. Patient not taking: Reported on 09/14/2014 05/05/14   Rolland Porter, MD  warfarin (COUMADIN) 2 MG tablet Take 2 mg by mouth daily. Take along with warfarin 41m to equal 773m    Historical  Provider, MD  warfarin (COUMADIN) 5 MG tablet Take 1.5 tablets (7.5 mg total) by mouth one time only at 6 PM. Take 41m61maily at 6pm Patient taking differently: Take 5 mg by mouth daily.  09/02/14   CarBarton DuboisD  warfarin (COUMADIN) 5 MG tablet Take 5 mg by mouth daily. Take along with Warfarin 2mg10m equal 7mg 79mHistorical Provider, MD   Current Facility-Administered Medications  Medication Dose Route Frequency Provider Last Rate Last Dose  . [START ON 10/08/2014] 0.9 %  sodium chloride infusion   Intravenous Continuous ScottFredia Sorrow  Stopped at 10/07/14 0551  . ALPRAZolam (XANADuanne Moronlet 0.25 mg  0.25 mg Oral BID PRN RichaJessee Avers  0.25 mg at 10/07/14 1013  .  amLODipine (NORVASC) tablet 10 mg  10 mg Oral Daily [name], MD   10 mg at 10/07/14 1012  . carvedilol (COREG) tablet 25 mg  25 mg Oral TID [name], MD      . cinacalcet [facility]) tablet 30 mg  30 mg Oral Q breakfast [name], MD      . cloNIDine (CATAPRES - Dosed in mg/24 hr) patch 0.3 mg  0.3 mg Transdermal Weekly [name], MD      . NIFEdipine (PROCARDIA-XL/ADALAT CC) 24 hr tablet 30 mg  30 mg Oral Daily [name], MD   30 mg at 10/07/14 0945  . pantoprazole (PROTONIX) EC tablet 40 mg  40 mg Oral Daily [name], MD   40 mg at 10/07/14 1012  . [START ON 10/08/2014] predniSONE (DELTASONE) tablet 10 mg  10 mg Oral Q breakfast [name], MD      . sulfamethoxazole-trimethoprim (BACTRIM,SEPTRA) 400-80 MG per tablet 1 tablet  1 tablet Oral Q M,W,F [name], MD      . tacrolimus (PROGRAF) capsule 2 mg  2 mg Oral BID [name], MD      . Derrill Memo ON 10/08/2014] vancomycin (VANCOCIN) IVPB 750 mg/150 ml premix  750 mg Intravenous Q T,Th,Sa-HD Erenest Blank, [facility]       Current Outpatient Prescriptions  Medication Sig Dispense Refill  . ALPRAZolam (XANAX) 0.25 MG tablet Take 0.25 mg by mouth daily as needed. For sleep    . amLODipine (NORVASC) 5 MG tablet Take 10 mg by  mouth daily.    [name] atorvastatin (LIPITOR) 40 MG tablet Take 1 tablet (40 mg total) by mouth daily at 6 PM. (Patient not taking: Reported on 09/19/2014) 30 tablet 1  . carvedilol (COREG) 25 MG tablet Take 25 mg by mouth 3 (three) times daily.     . cinacalcet (SENSIPAR) 30 MG tablet Take 30 mg by mouth daily.    . cloNIDine (CATAPRES - DOSED IN MG/24 HR) 0.3 mg/24hr patch Place 1 patch onto the skin. Every 7 days    . Ferric Citrate 210 MG TABS Take 210 mg by mouth 3 (three) times daily with meals. Take 1 with each meal and 1 with a snack.    [name] gentamicin cream (GARAMYCIN) 0.1 % Apply to [place] site    . hydrALAZINE (APRESOLINE) 25 MG tablet Take 100 mg by mouth 3 (three) times daily.     . hydrocortisone 1 % lotion Apply to site with itch as needed twice daily    . levalbuterol (XOPENEX HFA) 45 MCG/ACT inhaler Inhale 2 puffs into the lungs every 4 (four) hours as needed for wheezing or shortness of breath. 1 Inhaler 0  . methocarbamol (ROBAXIN-750) 750 MG tablet Take 1 tablet (750 mg total) by mouth every 6 (six) hours as needed for muscle spasms. 40 tablet 0  . minoxidil (LONITEN) 2.5 MG tablet Take 1 tablet (2.5 mg total) by mouth daily. (Patient taking differently: Take 5 mg by mouth daily. ) 30 tablet 0  . nabumetone (RELAFEN) 500 MG tablet Take 500 mg by mouth 2 (two) times daily.    [name] NIFEdipine (PROCARDIA-XL/ADALAT CC) 30 MG 24 hr tablet Take 1 tablet (30 mg total) by mouth daily. (Patient not taking: Reported on 09/19/2014) 30 tablet 0  . omeprazole (PRILOSEC) 20 MG capsule Take 20 mg by mouth daily.    [name] oxyCODONE (ROXICODONE) 5 MG immediate release tablet Take 1 tablet (5 mg total) by mouth every 4 (four) hours as needed  for severe pain. 10 tablet 0  . polyethylene glycol (MIRALAX) packet Take 17 g by mouth daily. For constipation (Patient taking differently: Take 17 g by mouth daily as needed for mild constipation. For constipation) 14 each 0  . predniSONE (DELTASONE) 10 MG tablet Take 10  mg by mouth daily with breakfast.    . sevelamer carbonate (RENVELA) 800 MG tablet Take 1,600 mg by mouth 3 (three) times daily with meals.    [name] sulfamethoxazole-trimethoprim (BACTRIM,SEPTRA) 400-80 MG per tablet Take 1 tablet by mouth every Monday, Wednesday, and Friday.    . tacrolimus (PROGRAF) 1 MG capsule Take 2 mg by mouth 2 (two) times daily.    . traMADol (ULTRAM) 50 MG tablet Take 2 tablets (100 mg total) by mouth every 6 (six) hours as needed. (Patient not taking: Reported on 09/14/2014) 16 tablet 0  . warfarin (COUMADIN) 2 MG tablet Take 2 mg by mouth daily. Take along with warfarin 53m to equal 771m    . warfarin (COUMADIN) 5 MG tablet Take 1.5 tablets (7.5 mg total) by mouth one time only at 6 PM. Take 33m20maily at 6pm (Patient taking differently: Take 5 mg by mouth daily. ) 30 tablet 0  . warfarin (COUMADIN) 5 MG tablet Take 5 mg by mouth daily. Take along with Warfarin 2mg50m equal 7mg 46m Labs: Basic Metabolic Panel:  Recent Labs Lab 10/01/14 1655 10/06/14 0532 10/07/14 0220  NA 133* 137 134*  K 5.4* 4.7 4.8  CL 96 100 99  CO2 _0 GLUCOSE 96 89 88  BUN 44* 49* 53*  CREATININE 11.95* 13.06* 14.42*  CALCIUM 8.0* 8.0* 7.8*  PHOS 4.8*  --   --    Liver Function Tests:  Recent Labs Lab 10/01/14 1655  ALBUMIN 2.6*   No results for input(s): LIPASE, AMYLASE in the last 168 hours. No results for input(s): AMMONIA in the last 168 hours. CBC:  Recent Labs Lab 09/30/14 1556  10/01/14 0651 10/01/14 1655 10/02/14 0305 10/06/14 0532 10/07/14 0220  WBC 6.4  --  5.9 5.6 5.1 6.0 6.8  NEUTROABS 4.7  --   --   --   --  3.4 4.6  HGB 8.5*  < > 7.5* 7.2* 7.3* 8.3* 7.9*  HCT 27.1*  < > 23.3* 22.4* 22.8* 26.3* 24.9*  MCV 79.7  --  78.7 78.0 77.8* 80.7 80.1  PLT 155  --  125* 130* 129* 165 173  < > = values in this interval not displayed. Cardiac Enzymes:  Recent Labs Lab 10/06/14 0532 10/06/14 0813  TROPONINI 0.09* 0.08*   CBG: No results for input(s):  GLUCAP in the last 168 hours. Iron Studies: No results for input(s): IRON, TIBC, TRANSFERRIN, FERRITIN in the last 72 hours. Studies/Results: Dg Chest 2 View  10/07/2014   CLINICAL DATA:  Chest pain and dyspnea for 2 days.  EXAM: CHEST  2 VIEW  COMPARISON:  None.  FINDINGS: There is a right jugular central line extending into the right atrium. There is moderate unchanged cardiomegaly. There is mild vascular and interstitial prominence which likely represents a degree of congestive heart failure. There are no effusions. There is no airspace consolidation.  IMPRESSION: Mild congestive heart failure.   Electronically Signed   By: DanieAndreas Newport   On: 10/07/2014 02:58   Ct Angio Chest Pe W/cm &/or Wo Cm  10/07/2014   CLINICAL DATA:  Dyspnea for 4 days with pain radiating into left upper extremity.  EXAM: CT ANGIOGRAPHY CHEST WITH CONTRAST  TECHNIQUE: Multidetector CT imaging of the chest was performed using the standard protocol during bolus administration of intravenous contrast. Multiplanar CT image reconstructions and MIPs were obtained to evaluate the vascular anatomy.  CONTRAST:  20m OMNIPAQUE IOHEXOL 350 MG/ML SOLN  COMPARISON:  08/30/2014  FINDINGS: The pulmonary vasculature is well opacified with no evidence of pulmonary embolism. There is mild aneurysmal enlargement of the proximal descending aorta, measuring 3.7 cm. The ascending aorta is normal in caliber. The mid the distal portions of the descending aorta are normal in caliber. This is unchanged from 08/30/2014.  There are no effusions. There is no mediastinal or hilar adenopathy. Esophagus appears unremarkable.  There is airspace opacity in the right upper lobe which may represent infiltrate or aspiration. The left lung is clear.  Upper abdomen is remarkable for atrophic kidneys and a large liver.  Review of the MIP images confirms the above findings.  IMPRESSION: 1. Negative for pulmonary embolism 2. Mild aneurysmal enlargement of the  proximal descending thoracic aorta. Recommend annual imaging followup by CTA or MRA. This recommendation follows 2010 ACCF/AHA/AATS/ACR/ASA/SCA/SCAI/SIR/STS/SVM Guidelines for the Diagnosis and Management of Patients with Thoracic Aortic Disease. Circulation.2010; 121: eY706-C3763. Right upper lobe infiltrate, possibly infectious.   Electronically Signed   By: DAndreas NewportM.D.   On: 10/07/2014 06:22     Review of Systems: Gen: Denies any fever, chills, sweats, anorexia, fatigue, weakness, malaise, weight loss, and sleep disorder CV: Reports sharp chest pain and LE edema. Denies palpitations, syncope, orthopnea, PND, Resp: Reports dyspnea at rest, dyspnea with exercise and cough  GI: Denies vomiting blood, jaundice, and fecal incontinence.   Denies dysphagia or odynophagia. GU : anuric MS: Reports Left shoulder pain- is improved since yesterday Derm: Denies rash, itching, dry skin, hives, moles, warts, or unhealing ulcers.  Psych: Denies depression, anxiety, memory loss, suicidal ideation, hallucinations, paranoia, and confusion. Heme: Denies bruising, bleeding, and enlarged lymph nodes. Neuro: No headache.  No diplopia. No dysarthria.  No dysphasia.  No history of CVA.  No Seizures. No paresthesias.  No weakness.   Physical Exam: Filed Vitals:   10/07/14 0810 10/07/14 0830 10/07/14 1012 10/07/14 1022  BP: 199/136 198/138 198/139 182/140  Pulse: 74   76  Temp:      TempSrc:      Resp: _0 SpO2: 97%   96%     General: Well developed, well nourished, in no acute distress. Sitting up in bed  Head: Normocephalic, atraumatic, sclera non-icteric, mucus membranes are moist  Whitish film on tongue Neck: Supple. JVD not elevated. PCL Lungs: Dim bases. Clear bilaterally to auscultation without wheezes, rales, or rhonchi. Breathing is unlabored. Heart: RRR with S1 S2. No murmurs, rubs, or gallops appreciated. LV lift, Gr 2/6 M Abdomen: Soft, non-tender, full. PD cath. Skin dry Liver  down 6 cm, PD cath R mid abdm M-S:  Strength and tone appear normal for age. Lower extremities: +2 LE edema Neuro: Alert and oriented X 3. Moves all extremities spontaneously. Psych:  Responds to questions appropriately with a normal affect. Dialysis Access:  R IJ HD cath SKIN  Dry scaling, icthyotic,   Dialysis Orders: [place]  4 hrs 170 lbs (77.2 kg) 2K/2.5Ca 350/700 Heparin 3500 U, 500 U/hr R IJ catheter  Hectorol 2.5 mcg No Aranesp or Venofer (allergic)  Assessment/Plan: 1.  HCAP - blood cultures pending RUL infiltrate. FLu pending. Vanc and cefepime/ afebrile. Not clear it is not  viral plus fluid xs 2. Chest pain- CT angio negative for PE/ drug screen pending. Elevated troponins. Recent ECHO 30-35% and  Cardiac cath mild CAD 3.  ESRD -  [place], last HD Tuesday, did not run full tx. Missed HD yesterday. HD today and tomorrow. K+ 4.8Severe nonadherence, vol xs. uremic 4.  Hypertension/volume  -reports edw at 170 lbs- need wt/ LE edema 182/140- reports taking about 5 BP meds but doesn't know what they are- norvasc, coreg, clonidine and nifedipine here If vol lower, will need less meds. 5.  Anemia  - hgb 7.9- no outpt ESA or Fe - watch CBC 6.  Metabolic bone disease -  cont sensipar and renvela 7.  Nutrition - renal diet 8. Failed kidney transplant- remains on immunosuppression Needs lower as no renal function and is dangerous 9. Hx DVT- on coumadin 10. PD cath- plans to start PD within a few weeks.  11. Severe nonadherence  Shelle Iron, NP [facility] [phone] 10/07/2014, 10:50 AM I have seen and examined this patient and agree with the plan of care seen, eval, counseled, examined. Repeated hosp for noncompliance .  Tzivia Oneil L 10/07/2014, 11:44 AM

## 2014-10-07 NOTE — Progress Notes (Signed)
Pt admitted to room 6E04 from dialysis via the ED.  Pt alert and oriented.  BP 168/104 HR 82 O2 sat 94% RA. Pleasant. Tele # 4 SR [id number].

## 2014-10-07 NOTE — Progress Notes (Signed)
ANTIBIOTIC CONSULT NOTE - INITIAL  Pharmacy Consult for Vancomycin Indication: rule out pneumonia  Allergies  Allergen Reactions  . Ferrlecit [Na Ferric Gluc Cplx In Sucrose] Shortness Of Breath, Swelling and Other (See Comments)    Swelling in throat  . Darvocet [Propoxyphene N-Acetaminophen] Hives    Patient Measurements: 65 kg Vital Signs: Temp: 98.4 F (36.9 C) (03/18 0154) Temp Source: Oral (03/18 0154) BP: 210/139 mmHg (03/18 0445) Pulse Rate: 78 (03/18 0549)  Labs:  Recent Labs  10/06/14 0532 10/07/14 0220  WBC 6.0 6.8  HGB 8.3* 7.9*  PLT 165 173  CREATININE 13.06* 14.42*   Estimated Creatinine Clearance: 5.6 mL/min (by C-G formula based on Cr of 14.42).  Medical History: Past Medical History  Diagnosis Date  . Hypertension   . Depression   . Complication of anesthesia     itching, sore throat  . Diabetes mellitus without complication     No history per patient, but remains under history as A1c would not be accurate given on dialysis  . Shortness of breath   . Anxiety   . ESRD (end stage renal disease)     due to HTN per patient, followed at [facility], s/p failed kidney transplant - dialysis Tue, Th, Sat  . Renal insufficiency     Assessment: Vancomycin for possible PNA. WBC WNL. Pt has ESRD on HD TTS.   Goal of Therapy:  Pre-HD vancomycin level 15-25 mg/L  Plan:  -Vancomycin 1500 mg IV x 1, then 750 mg IV qHD TTS -F/U gram negative coverage (Cefepime x 1 ordered) -Trend WBC, temp, HD schedule -Drug levels as indicated   Narda Bonds 10/07/2014,6:46 AM

## 2014-10-07 NOTE — ED Notes (Signed)
Pt c/o L sided chest pain worsening on palpation and inspiration. Reports pain has gotten progressively worse since being seen at [facility] ED. Also reports productive cough with brown/white phlegm.

## 2014-10-07 NOTE — Progress Notes (Signed)
Patient refused to have RN swab nose for influenza pcr. Gentri Guardado, [name], Therapist, sports

## 2014-10-07 NOTE — ED Notes (Signed)
Attempted IV x1, unsuccessful.

## 2014-10-07 NOTE — Procedures (Signed)
I was present at this session.  I have reviewed the session itself and made appropriate changes. BP^^, volxs going for 5 l. Cath flow 400  Frank [name] L 3/18/20161:02 PM

## 2014-10-07 NOTE — ED Notes (Signed)
Patient woke up with chest pain at 0030.  Patient has pain upon palpation and cough.  Patient is a dialysis patient and missed his dialysis yesterday morning because he was at [facility] yesterday am.  Patient is CAOx3.

## 2014-10-07 NOTE — ED Notes (Signed)
Renal diet ordered for patient. Dr. Rogene Houston reports OPS to admit pt. He can eat. Made aware of pt's BP.

## 2014-10-07 NOTE — ED Notes (Signed)
Pt given ice chips per Dr. Helane Gunther

## 2014-10-07 NOTE — H&P (Signed)
Date: 10/07/2014               Patient Name:  Frank [name] MRN: [id number]  DOB: 10/26/1965 Age / Sex: 49 y.o., male   PCP: Lance Bosch, NP              Medical Service: Internal Medicine Teaching Service              Attending Physician: Dr. Annia Belt, MD    First Contact: Dr. Hulen Luster Pager: [phone]  Second Contact: Dr. Heber [place] Pager: [phone]            After Hours (After 5p/  First Contact Pager: [phone]  weekends / holidays): Second Contact Pager: [phone]   Chief Complaint: Chest pain and shortness of breath  History of Present Illness: This is a 49 year old gentleman with past medical history of ESRD on TTS schedule for dialysis, status renal transplant on immunosuppression with prednisone andTacrolimus, hypertension, history of PE, systolic CHF, prolonged QTC, among other medical problems who presents to the ED with left-sided chest pain. He describes his chest pain as sharp, increased with deep breathing, 9/10, constant, radiating to the left arm, left shoulder and neck and left back. No relieving factors. The pain is nonexertional and non-positional. He first noticed the pain left neck, shoulder and arm when he was doing dialysis on Tuesday and progressively worsened to involve his left chest. He remembers no history of trauma to his neck or abnormal posturing during sleep. He endorses history of cough with green and brown sputum which has been present for about 3 days. He denies wheezing, or other URI symptoms. He reports subjective chills and a temperature max at home of 99.9. He has nausea but without vomiting. Due to pain he states that he has been unable to sleep for several nights. On 10/06/2014, he was evaluated in the [facility] ED where he was discharged with oxycodone 5 mg every 4 hours as needed for pain. Patient has been taking up to 2 pills, but his pain has persisted. At [facility] ED, 2 view chest x-ray revealed mild congestive heart failure and a chest  CTA was negative for PE, but it revealed mild aneurysmal enlargement of the proximal descending thoracic aorta (radiology recommended annual imaging follow-up with CTA or MRA) and right upper lobe infiltrate, possibly infectious. He was started on cefepime and vancomycin as a treatment for HCAP.  Of note, echocardiogram on 10/02/2014 revealed EF of 30-35%, diffuse hypokinesis, restrictive physiology, and severe left atrial enlargement. Myoview on 09/01/2014 was significant for prior inferior wall infarct and peri-infarct ischemia. Follow-up left heart catheterization revealed mild CAD. His CHF was thought to be secondary to NICM.  EKG revealed T-wave inversions in V6  and T-wave flattening in V5 (new compared to EKG on 3/17). Otherwise, no other acute ischemic changes. Troponin was mildly elevated at 0.09 and 0.08 at [facility] on 3/17.  Review of Systems: Constitutional: no appetite change and fatigue.  Cardiovascular: No PND or Orthopnea but has swelling of his LE. Gastrointestinal: No N/V or diarrhea. No abdominal pain. No hematochezia or melena.  Genitourinary: No dysuria, urgency, frequency, hematuria, flank pain and difficulty urinating.  Musculoskeletal: No myalgias, back pain, joint swelling, arthralgias and gait problem.  Skin: No rash and wound. No easy bruising. Neurological: No dizziness, seizures, syncope, weakness, LH, numbness and headaches.  Psychiatric/Behavioral: No SI, mood changes, confusion, nervousness, sleep disturbance and agitation  Meds:  Current Outpatient Prescriptions  Medication Sig Dispense Refill  .  ALPRAZolam (XANAX) 0.25 MG tablet Take 0.25 mg by mouth daily as needed. For sleep    . amLODipine (NORVASC) 5 MG tablet Take 10 mg by mouth daily.    [name] atorvastatin (LIPITOR) 40 MG tablet Take 1 tablet (40 mg total) by mouth daily at 6 PM. (Patient not taking: Reported on 09/19/2014) 30 tablet 1  . carvedilol (COREG) 25 MG tablet Take 25 mg by mouth 3 (three) times daily.      . cinacalcet (SENSIPAR) 30 MG tablet Take 30 mg by mouth daily.    . cloNIDine (CATAPRES - DOSED IN MG/24 HR) 0.3 mg/24hr patch Place 1 patch onto the skin. Every 7 days    . Ferric Citrate 210 MG TABS Take 210 mg by mouth 3 (three) times daily with meals. Take 1 with each meal and 1 with a snack.    [name] gentamicin cream (GARAMYCIN) 0.1 % Apply to [place] site    . hydrALAZINE (APRESOLINE) 25 MG tablet Take 100 mg by mouth 3 (three) times daily.     . hydrocortisone 1 % lotion Apply to site with itch as needed twice daily    . levalbuterol (XOPENEX HFA) 45 MCG/ACT inhaler Inhale 2 puffs into the lungs every 4 (four) hours as needed for wheezing or shortness of breath. 1 Inhaler 0  . methocarbamol (ROBAXIN-750) 750 MG tablet Take 1 tablet (750 mg total) by mouth every 6 (six) hours as needed for muscle spasms. 40 tablet 0  . minoxidil (LONITEN) 2.5 MG tablet Take 1 tablet (2.5 mg total) by mouth daily. (Patient taking differently: Take 5 mg by mouth daily. ) 30 tablet 0  . nabumetone (RELAFEN) 500 MG tablet Take 500 mg by mouth 2 (two) times daily.    [name] NIFEdipine (PROCARDIA-XL/ADALAT CC) 30 MG 24 hr tablet Take 1 tablet (30 mg total) by mouth daily. (Patient not taking: Reported on 09/19/2014) 30 tablet 0  . omeprazole (PRILOSEC) 20 MG capsule Take 20 mg by mouth daily.    [name] oxyCODONE (ROXICODONE) 5 MG immediate release tablet Take 1 tablet (5 mg total) by mouth every 4 (four) hours as needed for severe pain. 10 tablet 0  . polyethylene glycol (MIRALAX) packet Take 17 g by mouth daily. For constipation (Patient taking differently: Take 17 g by mouth daily as needed for mild constipation. For constipation) 14 each 0  . predniSONE (DELTASONE) 10 MG tablet Take 10 mg by mouth daily with breakfast.    . sevelamer carbonate (RENVELA) 800 MG tablet Take 1,600 mg by mouth 3 (three) times daily with meals.    [name] sulfamethoxazole-trimethoprim (BACTRIM,SEPTRA) 400-80 MG per tablet Take 1 tablet by mouth  every Monday, Wednesday, and Friday.    . tacrolimus (PROGRAF) 1 MG capsule Take 2 mg by mouth 2 (two) times daily.    . traMADol (ULTRAM) 50 MG tablet Take 2 tablets (100 mg total) by mouth every 6 (six) hours as needed. (Patient not taking: Reported on 09/14/2014) 16 tablet 0  . warfarin (COUMADIN) 2 MG tablet Take 2 mg by mouth daily. Take along with warfarin 4m to equal 7260m    . warfarin (COUMADIN) 5 MG tablet Take 1.5 tablets (7.5 mg total) by mouth one time only at 6 PM. Take 60m64maily at 6pm (Patient taking differently: Take 5 mg by mouth daily. ) 30 tablet 0  . warfarin (COUMADIN) 5 MG tablet Take 5 mg by mouth daily. Take along with Warfarin 2mg60m equal 7mg24m  Allergies: Allergies as of 10/07/2014 - Review Complete 10/07/2014  Allergen Reaction Noted  . Ferrlecit [na ferric gluc cplx in sucrose] Shortness Of Breath, Swelling, and Other (See Comments) 06/24/2013  . Darvocet [propoxyphene n-acetaminophen] Hives 10/16/2011   Past Medical History  Diagnosis Date  . Hypertension   . Depression   . Complication of anesthesia     itching, sore throat  . Diabetes mellitus without complication     No history per patient, but remains under history as A1c would not be accurate given on dialysis  . Shortness of breath   . Anxiety   . ESRD (end stage renal disease)     due to HTN per patient, followed at [facility], s/p failed kidney transplant - dialysis Tue, Th, Sat  . Renal insufficiency    Past Surgical History  Procedure Laterality Date  . Kidney receipient  2006    failed and started HD in March 2014  . Capd insertion    . Capd removal    . Left heart catheterization with coronary angiogram N/A 09/02/2014    Procedure: LEFT HEART CATHETERIZATION WITH CORONARY ANGIOGRAM;  Surgeon: [name], MD;  Location: [facility] CATH LAB;  Service: Cardiovascular;  Laterality: N/A;   No family history on file. History   Social History  . Marital Status: Married    Spouse Name: N/A  .  Number of Children: 3  . Years of Education: [employer]   Occupational History  . Not on file.   Social History Main Topics  . Smoking status: Former Smoker -- 1.00 packs/day for 1 years    Types: Cigarettes  . Smokeless tobacco: Never Used     Comment: quit Jan 2014  . Alcohol Use: No  . Drug Use: No  . Sexual Activity: Not Currently   Other Topics Concern  . Not on file   Social History Narrative   Owns own plumbing company    Physical Exam: Blood pressure 198/138, pulse 74, temperature 98.8 F (37.1 C), temperature source Oral, resp. rate 24, SpO2 97 %.  General: well developed, well nourished; mild acute distress, cooperative with exam HEENT: Neck is supple. Left-sided neck tenderness on palpation down to the left shoulder and left upper arm. Head is Atraumatic. Normal EOM. Pupils equal, round and reactive; anicteric. Nose/throat: oropharynx clear, moist mucous membranes, pink gums  Lungs/Chest wall: Left sided chest wall tenderness on gentle palpation. Right HD catheter without evidence of infection. CTA bil, normal work of breathing  Heart: normal RRR; no murmurs. No JVD Pulses: radial and dorsalis pedis pulses are 2+ and symmetric  Abdomen: PD catheter in the right lumbar region. Does not appear infected. Abdomen with normal fullness, no rebound, guarding, or rigidity; nl BS; no palpable masses.  Skin: Chronic scaring of his skin with desquamation. warm, dry, intact, normal turgor, no rashes  Extremities: Left shoulder with good range of motion. No joint tenderness. Bilateral peripheral edema, clubbing, or cyanosis Neurologic: A&O X3, CN II - XII are grossly intact. Strength 5/5 in the all 4 extremities, Sensation grossly intact. Psych: Normal mood and affect. Normal speech and behavior. No agitation   Lab results: Basic Metabolic Panel:  Recent Labs  10/06/14 0532 10/07/14 0220  NA 137 134*  K 4.7 4.8  CL 100 99  CO2 24 22  GLUCOSE 89 88  BUN 49* 53*  CREATININE  13.06* 14.42*  CALCIUM 8.0* 7.8*   Liver Function Tests: No results for input(s): AST, ALT, ALKPHOS, BILITOT, PROT, ALBUMIN in  the last 72 hours. No results for input(s): LIPASE, AMYLASE in the last 72 hours. No results for input(s): AMMONIA in the last 72 hours. CBC:  Recent Labs  10/06/14 0532 10/07/14 0220  WBC 6.0 6.8  NEUTROABS 3.4 4.6  HGB 8.3* 7.9*  HCT 26.3* 24.9*  MCV 80.7 80.1  PLT 165 173   Cardiac Enzymes:  Recent Labs  10/06/14 0532 10/06/14 0813  TROPONINI 0.09* 0.08*   Coagulation:  Recent Labs  10/06/14 0532  LABPROT 18.6*  INR 1.54*   Urine Drug Screen: Drugs of Abuse     Component Value Date/Time   LABOPIA NONE DETECTED 09/17/2013 0604   COCAINSCRNUR NONE DETECTED 09/17/2013 0604   LABBENZ NONE DETECTED 09/17/2013 0604   AMPHETMU NONE DETECTED 09/17/2013 0604   THCU POSITIVE* 09/17/2013 0604   LABBARB NONE DETECTED 09/17/2013 0604     Imaging results:  Dg Chest 2 View  10/07/2014   CLINICAL DATA:  Chest pain and dyspnea for 2 days.  EXAM: CHEST  2 VIEW  COMPARISON:  None.  FINDINGS: There is a right jugular central line extending into the right atrium. There is moderate unchanged cardiomegaly. There is mild vascular and interstitial prominence which likely represents a degree of congestive heart failure. There are no effusions. There is no airspace consolidation.  IMPRESSION: Mild congestive heart failure.   Electronically Signed   By: Andreas Newport M.D.   On: 10/07/2014 02:58   Ct Angio Chest Pe W/cm &/or Wo Cm  10/07/2014   CLINICAL DATA:  Dyspnea for 4 days with pain radiating into left upper extremity.  EXAM: CT ANGIOGRAPHY CHEST WITH CONTRAST  TECHNIQUE: Multidetector CT imaging of the chest was performed using the standard protocol during bolus administration of intravenous contrast. Multiplanar CT image reconstructions and MIPs were obtained to evaluate the vascular anatomy.  CONTRAST:  52m OMNIPAQUE IOHEXOL 350 MG/ML SOLN   COMPARISON:  08/30/2014  FINDINGS: The pulmonary vasculature is well opacified with no evidence of pulmonary embolism. There is mild aneurysmal enlargement of the proximal descending aorta, measuring 3.7 cm. The ascending aorta is normal in caliber. The mid the distal portions of the descending aorta are normal in caliber. This is unchanged from 08/30/2014.  There are no effusions. There is no mediastinal or hilar adenopathy. Esophagus appears unremarkable.  There is airspace opacity in the right upper lobe which may represent infiltrate or aspiration. The left lung is clear.  Upper abdomen is remarkable for atrophic kidneys and a large liver.  Review of the MIP images confirms the above findings.  IMPRESSION: 1. Negative for pulmonary embolism 2. Mild aneurysmal enlargement of the proximal descending thoracic aorta. Recommend annual imaging followup by CTA or MRA. This recommendation follows 2010 ACCF/AHA/AATS/ACR/ASA/SCA/SCAI/SIR/STS/SVM Guidelines for the Diagnosis and Management of Patients with Thoracic Aortic Disease. Circulation.2010; 121: [id number]. Right upper lobe infiltrate, possibly infectious.   Electronically Signed   By: DAndreas NewportM.D.   On: 10/07/2014 06:22    Other results: EKG: normal EKG, normal sinus rhythm, nonspecific ST and T waves changes (with T-wave inversions in V5 and V6 which are new compared to EKG of 10/06/2014.), prolonged QT interval, LVH.  Assessment & Plan by Problem: Principal Problem:   HCAP (healthcare-associated pneumonia) Active Problems:   Anemia of chronic kidney failure   CHF (congestive heart failure)   Generalized anxiety disorder   Chronic anticoagulation   Prolonged Q-T interval on ECG   Hypertensive urgency   End-stage renal disease on hemodialysis   Immunosuppression  Probable HCAP: Etiology of his pleuritic chest pain is not clear at this time. PE has been excoriated on a CT of the chest. There is a questionable right upper lobe are  infiltrate, raising a concern for HCAP in a patient who is on chronic immunosuppression. Two-view chest x-ray did not reveal infiltrate, but reported mild congestive heart failure. Patient reported a fever of 99.9 but has been a febrile here. No leukocytosis. Other causes of bradycardia, chest pain, like pericarditis still remain a possibility. Plan -Continue with vancomycin and cefepime. Can be discontinued if there is no evidence of infection  -IV Toradol, as is his pain is likely inflammatory -Blood cultures ordered. -Screen for HIV, strep urine antigen, Legionella urine antigen, and sputum Gram stain and culture  Left-sided chest pain: Patient has chronic troponin elevation. Currently, troponin 0.9. His chest pain is not very typical for acute coronary syndrome. Left heart catheterization 1 month ago revealed mild CAD. Chest CTA revealed mild aneurysmal enlargement in the proximal descending thoracic aorta. Unlikely that this is accounting for his chest pain. Recommendation by the radiology to follow-up with CTA or MRA annually. Plan -Telemetry. -Cycle troponins -Complete cervical spine x-ray to rule out cervical radiculopathy. -May require to consult cardiology if troponins continue to be elevated  Hypertensive urgency: Blood pressure severely elevated. Patient on multiple outpatient medications for blood pressure including amlodipine 10 mg daily, Coreg 25 mg 3 times a day, clonidine patch 0.3 mg every 24 hours, hydralazine 100 mg 3 times a day, nifedipine 30 mg daily, and minoxidil 2.5 mg daily. Plan -Continue with clonidine patch. -Amlodipine 10 mg daily. -Nifedipine 30 mg daily. -Coreg 25 mg 3 times a day -May restart hydralazine if blood pressure remains elevated  ESRD on HD TTS: Status post kidney transplant 2006 >>failed. Last dialysis on Tuesday. Has PD catheter in place and right IJ HD catheter. Both without evidence of infection. No acute electrolyte abnormalities on BMP. No  evidence of volume overload, even though he has bilateral pitting edema  Plan -Consulted nephrology for hemodialysis -Continue with prednisone and tacrolimus  Chronic Systolic CHF: No evidence of acute volume overload to suggest exacerbation: Echocardiogram on 10/02/2014 with EF of 30-35%. Severe LAE. Cath in February 2013 revealed mild CAD, but he does have diffuse hypokinesis. Likely nonischemic cardiomyopathy. Chest x-ray revealed mild congestive heart failure. Plan -Continue with Coreg as above. -Monitor clinically.  Anxiety disorder: On home Xanax 0.25 mg daily when necessary.  We will continue   Anemia of chronic renal disease: Hemoglobin 7.9. Stable. No acute bleeding. Will monitor  History of pulmonary embolism: CTA 05/08/2014 revealed acute sub-segmental pulmonary embolism on the left upper lobe. Patient on Coumadin. INR slightly subtherapeutic at 1.54. Plan -Continue with Coumadin per pharmacy  Code Status: Full code   F/E/N: heart healthy diet   VTE Ppx:  Cont with Coumadin per pharmacy  Family Communication: Discussed plan of care with the patient. Answered all his questions.   Dispo: Disposition is deferred at this time, awaiting improvement of current medical problems. Anticipated discharge in approximately 2-3 day(s).   The patient does have a current PCP Lance Bosch, NP), therefore is not require OPC follow-up after discharge.   The patient does not have transportation limitations that hinder transportation to clinic appointments.   Signed:  [name], MD PGY-3 Internal Medicine Teaching Service Pager [phone]  10/07/2014, 10:00 AM

## 2014-10-07 NOTE — Progress Notes (Signed)
ANTIBIOTIC CONSULT NOTE - FOLLOW UP  Pharmacy Consult for vancomycin and cefepime Indication: rule out pneumonia  Allergies  Allergen Reactions  . Ferrlecit [Na Ferric Gluc Cplx In Sucrose] Shortness Of Breath, Swelling and Other (See Comments)    Swelling in throat  . Darvocet [Propoxyphene N-Acetaminophen] Hives    Patient Measurements: Weight: 179 lb 7.3 oz (81.4 kg)  Ht" 6'1"   Vital Signs: Temp: 98.8 F (37.1 C) (03/18 0736) Temp Source: Oral (03/18 0736) BP: 177/118 mmHg (03/18 1416) Pulse Rate: 74 (03/18 1416) Intake/Output from previous day:   Intake/Output from this shift:    Labs:  Recent Labs  10/06/14 0532 10/07/14 0220  WBC 6.0 6.8  HGB 8.3* 7.9*  PLT 165 173  CREATININE 13.06* 14.42*   Estimated Creatinine Clearance: 6.9 mL/min (by C-G formula based on Cr of 14.42). No results for input(s): VANCOTROUGH, VANCOPEAK, VANCORANDOM, GENTTROUGH, GENTPEAK, GENTRANDOM, TOBRATROUGH, TOBRAPEAK, TOBRARND, AMIKACINPEAK, AMIKACINTROU, AMIKACIN in the last 72 hours.   Microbiology: No results found for this or any previous visit (from the past 720 hour(s)).  Anti-infectives    Start     Dose/Rate Route Frequency Ordered Stop   10/08/14 1800  ceFEPIme (MAXIPIME) 2 g in dextrose 5 % 50 mL IVPB     2 g 100 mL/hr over 30 Minutes Intravenous Every T-Th-Sa (1800) 10/07/14 1412     10/08/14 1200  vancomycin (VANCOCIN) IVPB 750 mg/150 ml premix     750 mg 150 mL/hr over 60 Minutes Intravenous Every T-Th-Sa (Hemodialysis) 10/07/14 0645     10/07/14 1800  ceFEPIme (MAXIPIME) 2 g in dextrose 5 % 50 mL IVPB     2 g 100 mL/hr over 30 Minutes Intravenous  Once 10/07/14 1411     10/07/14 1030  sulfamethoxazole-trimethoprim (BACTRIM,SEPTRA) 400-80 MG per tablet 1 tablet     1 tablet Oral Every M-W-F 10/07/14 0952     10/07/14 0645  ceFEPIme (MAXIPIME) 1 g in dextrose 5 % 50 mL IVPB  Status:  Discontinued     1 g 100 mL/hr over 30 Minutes Intravenous  Once 10/07/14 0640  10/07/14 0644   10/07/14 0645  ceFEPIme (MAXIPIME) 2 g in dextrose 5 % 50 mL IVPB     2 g 100 mL/hr over 30 Minutes Intravenous  Once 10/07/14 0644 10/07/14 0821   10/07/14 0645  vancomycin (VANCOCIN) 1,500 mg in sodium chloride 0.9 % 500 mL IVPB     1,500 mg 250 mL/hr over 120 Minutes Intravenous  Once 10/07/14 0644 10/07/14 1134      Assessment: 30 YOM with SOB, CT angio neg for PE. Started on vancomycin and cefepime for HCAP. Patient is ESRD on HD, normal schedule TTS- missed Thursday's session and only dialyzed for ~1.5h on Tuesday d/t not feeling well. In HD currently. Was given vancomycin 1538m IV x1 and cefepime 2g IV x1 in the ED early this morning. WBC nml, afeb. Note patient is chronically immunosuppressed as he has hx of failed renal transplant.  Goal of Therapy:  pre-HD vancomycin level 15-258m/mL  Plan:  -vancomycin 75059mV x1 today after HD, then continue 750m25m qHD TTSat -cefepime 2g IV x1 today after HD, then continue 2g IV qHD TTSat -will follow HD schedule closely for any changes and needs to adjust dosing times -follow clinical progression, c/s, levels PRN  Jader Desai D. Marleni Gallardo, PharmD, BCPS Clinical Pharmacist Pager: [phone] 2:22 PM

## 2014-10-08 DIAGNOSIS — F079 Unspecified personality and behavioral disorder due to known physiological condition: Secondary | ICD-10-CM

## 2014-10-08 DIAGNOSIS — D631 Anemia in chronic kidney disease: Secondary | ICD-10-CM

## 2014-10-08 DIAGNOSIS — I120 Hypertensive chronic kidney disease with stage 5 chronic kidney disease or end stage renal disease: Secondary | ICD-10-CM

## 2014-10-08 DIAGNOSIS — J209 Acute bronchitis, unspecified: Secondary | ICD-10-CM

## 2014-10-08 DIAGNOSIS — I5022 Chronic systolic (congestive) heart failure: Secondary | ICD-10-CM

## 2014-10-08 DIAGNOSIS — Z86711 Personal history of pulmonary embolism: Secondary | ICD-10-CM

## 2014-10-08 DIAGNOSIS — F419 Anxiety disorder, unspecified: Secondary | ICD-10-CM

## 2014-10-08 LAB — CBC WITH DIFFERENTIAL/PLATELET
BASOS ABS: 0 10*3/uL (ref 0.0–0.1)
Basophils Absolute: 0 10*3/uL (ref 0.0–0.1)
Basophils Relative: 0 % (ref 0–1)
Basophils Relative: 0 % (ref 0–1)
EOS PCT: 0 % (ref 0–5)
Eosinophils Absolute: 0.1 10*3/uL (ref 0.0–0.7)
Eosinophils Absolute: 0 10*3/uL (ref 0.0–0.7)
Eosinophils Relative: 2 % (ref 0–5)
HCT: 25.2 % — ABNORMAL LOW (ref 39.0–52.0)
HEMATOCRIT: 22.9 % — AB (ref 39.0–52.0)
Hemoglobin: 7.4 g/dL — ABNORMAL LOW (ref 13.0–17.0)
Hemoglobin: 8 g/dL — ABNORMAL LOW (ref 13.0–17.0)
LYMPHS ABS: 0.8 10*3/uL (ref 0.7–4.0)
LYMPHS ABS: 0.6 10*3/uL — AB (ref 0.7–4.0)
LYMPHS PCT: 24 % (ref 12–46)
LYMPHS PCT: 13 % (ref 12–46)
MCH: 25.3 pg — ABNORMAL LOW (ref 26.0–34.0)
MCH: 25 pg — ABNORMAL LOW (ref 26.0–34.0)
MCHC: 32.3 g/dL (ref 30.0–36.0)
MCHC: 31.7 g/dL (ref 30.0–36.0)
MCV: 78.2 fL (ref 78.0–100.0)
MCV: 78.8 fL (ref 78.0–100.0)
MONO ABS: 0.4 10*3/uL (ref 0.1–1.0)
MONOS PCT: 12 % (ref 3–12)
Monocytes Absolute: 0.3 10*3/uL (ref 0.1–1.0)
Monocytes Relative: 7 % (ref 3–12)
NEUTROS ABS: 2.2 10*3/uL (ref 1.7–7.7)
NEUTROS PCT: 62 % (ref 43–77)
Neutro Abs: 3.6 10*3/uL (ref 1.7–7.7)
Neutrophils Relative %: 80 % — ABNORMAL HIGH (ref 43–77)
PLATELETS: 168 10*3/uL (ref 150–400)
Platelets: 188 10*3/uL (ref 150–400)
RBC: 2.93 MIL/uL — AB (ref 4.22–5.81)
RBC: 3.2 MIL/uL — AB (ref 4.22–5.81)
RDW: 18.4 % — AB (ref 11.5–15.5)
RDW: 18.4 % — AB (ref 11.5–15.5)
WBC: 3.5 10*3/uL — AB (ref 4.0–10.5)
WBC: 4.5 10*3/uL (ref 4.0–10.5)

## 2014-10-08 LAB — BASIC METABOLIC PANEL
ANION GAP: 9 (ref 5–15)
ANION GAP: 6 (ref 5–15)
BUN: 26 mg/dL — AB (ref 6–23)
BUN: 14 mg/dL (ref 6–23)
CHLORIDE: 96 mmol/L (ref 96–112)
CO2: 27 mmol/L (ref 19–32)
CO2: 30 mmol/L (ref 19–32)
Calcium: 7.5 mg/dL — ABNORMAL LOW (ref 8.4–10.5)
Calcium: 7.4 mg/dL — ABNORMAL LOW (ref 8.4–10.5)
Chloride: 99 mmol/L (ref 96–112)
Creatinine, Ser: 9.12 mg/dL — ABNORMAL HIGH (ref 0.50–1.35)
Creatinine, Ser: 5.59 mg/dL — ABNORMAL HIGH (ref 0.50–1.35)
GFR calc non Af Amer: 6 mL/min — ABNORMAL LOW (ref >90)
GFR calc non Af Amer: 11 mL/min — ABNORMAL LOW (ref >90)
GFR, EST AFRICAN AMERICAN: 7 mL/min — AB (ref >90)
GFR, EST AFRICAN AMERICAN: 12 mL/min — AB (ref >90)
GLUCOSE: 131 mg/dL — AB (ref 70–99)
Glucose, Bld: 123 mg/dL — ABNORMAL HIGH (ref 70–99)
Potassium: 3.9 mmol/L (ref 3.5–5.1)
Potassium: 4.2 mmol/L (ref 3.5–5.1)
Sodium: 135 mmol/L (ref 135–145)
Sodium: 132 mmol/L — ABNORMAL LOW (ref 135–145)

## 2014-10-08 LAB — PROTIME-INR
INR: 1.94 — ABNORMAL HIGH (ref 0.00–1.49)
INR: 1.96 — ABNORMAL HIGH (ref 0.00–1.49)
PROTHROMBIN TIME: 22.5 s — AB (ref 11.6–15.2)
Prothrombin Time: 22.3 seconds — ABNORMAL HIGH (ref 11.6–15.2)

## 2014-10-08 LAB — HIV ANTIBODY (ROUTINE TESTING W REFLEX): HIV SCREEN 4TH GENERATION: NONREACTIVE

## 2014-10-08 MED ORDER — VANCOMYCIN HCL IN DEXTROSE 750-5 MG/150ML-% IV SOLN
750.0000 mg | INTRAVENOUS | Status: DC
  Administered 2014-10-08: 750 mg via INTRAVENOUS
  Filled 2014-10-08: qty 150

## 2014-10-08 MED ORDER — CARVEDILOL 25 MG PO TABS
25.0000 mg | ORAL_TABLET | Freq: Two times a day (BID) | ORAL | Status: DC
Start: 2014-10-08 — End: 2014-10-10
  Administered 2014-10-08 – 2014-10-10 (×4): 25 mg via ORAL
  Filled 2014-10-08 (×6): qty 1

## 2014-10-08 MED ORDER — ALBUTEROL SULFATE HFA 108 (90 BASE) MCG/ACT IN AERS
1.0000 | INHALATION_SPRAY | Freq: Once | RESPIRATORY_TRACT | Status: DC
  Filled 2014-10-08: qty 6.7

## 2014-10-08 MED ORDER — ALBUTEROL SULFATE (2.5 MG/3ML) 0.083% IN NEBU
2.5000 mg | INHALATION_SOLUTION | Freq: Once | RESPIRATORY_TRACT | Status: AC
  Administered 2014-10-08: 2.5 mg via RESPIRATORY_TRACT
  Filled 2014-10-08: qty 3

## 2014-10-08 MED ORDER — MINOXIDIL 2.5 MG PO TABS
2.5000 mg | ORAL_TABLET | Freq: Every day | ORAL | Status: DC
  Administered 2014-10-08: 2.5 mg via ORAL
  Filled 2014-10-08 (×2): qty 1

## 2014-10-08 MED ORDER — DEXTROSE 5 % IV SOLN
2.0000 g | INTRAVENOUS | Status: DC
  Administered 2014-10-08: 2 g via INTRAVENOUS
  Filled 2014-10-08: qty 2

## 2014-10-08 MED ORDER — PENTAFLUOROPROP-TETRAFLUOROETH EX AERO: 1.0000 "application " | INHALATION_SPRAY | CUTANEOUS | Status: DC | PRN

## 2014-10-08 MED ORDER — HEPARIN SODIUM (PORCINE) 1000 UNIT/ML DIALYSIS: 1000.0000 [IU] | INTRAMUSCULAR | Status: DC | PRN

## 2014-10-08 MED ORDER — OXYCODONE-ACETAMINOPHEN 5-325 MG PO TABS
ORAL_TABLET | ORAL | Status: AC
  Filled 2014-10-08: qty 1

## 2014-10-08 MED ORDER — DOXERCALCIFEROL 4 MCG/2ML IV SOLN
INTRAVENOUS | Status: AC
  Administered 2014-10-08: 2.5 ug via INTRAVENOUS
  Filled 2014-10-08: qty 2

## 2014-10-08 MED ORDER — LIDOCAINE HCL (PF) 1 % IJ SOLN: 5.0000 mL | INTRAMUSCULAR | Status: DC | PRN

## 2014-10-08 MED ORDER — HYDRALAZINE HCL 50 MG PO TABS
100.0000 mg | ORAL_TABLET | Freq: Three times a day (TID) | ORAL | Status: DC
  Administered 2014-10-08 – 2014-10-10 (×6): 100 mg via ORAL
  Filled 2014-10-08 (×8): qty 2

## 2014-10-08 MED ORDER — NEPRO/CARBSTEADY PO LIQD: 237.0000 mL | ORAL | Status: DC | PRN

## 2014-10-08 MED ORDER — ALBUTEROL SULFATE HFA 108 (90 BASE) MCG/ACT IN AERS: 1.0000 | INHALATION_SPRAY | Freq: Four times a day (QID) | RESPIRATORY_TRACT | Status: DC | PRN

## 2014-10-08 MED ORDER — ALTEPLASE 2 MG IJ SOLR
2.0000 mg | Freq: Once | INTRAMUSCULAR | Status: DC | PRN
Start: 2014-10-08 — End: 2014-10-08
  Filled 2014-10-08: qty 2

## 2014-10-08 MED ORDER — CEFEPIME HCL 2 G IJ SOLR
2.0000 g | INTRAMUSCULAR | Status: DC
  Filled 2014-10-08: qty 2

## 2014-10-08 MED ORDER — WARFARIN SODIUM 7.5 MG PO TABS
7.5000 mg | ORAL_TABLET | Freq: Once | ORAL | Status: AC
  Administered 2014-10-08: 7.5 mg via ORAL
  Filled 2014-10-08: qty 1

## 2014-10-08 MED ORDER — HEPARIN SODIUM (PORCINE) 1000 UNIT/ML DIALYSIS
3500.0000 [IU] | Freq: Once | INTRAMUSCULAR | Status: DC
Start: 2014-10-08 — End: 2014-10-08

## 2014-10-08 MED ORDER — OXYCODONE-ACETAMINOPHEN 5-325 MG PO TABS
1.0000 | ORAL_TABLET | Freq: Four times a day (QID) | ORAL | Status: DC | PRN
  Administered 2014-10-08 – 2014-10-10 (×4): 2 via ORAL
  Filled 2014-10-08 (×4): qty 2

## 2014-10-08 MED ORDER — OXYCODONE-ACETAMINOPHEN 5-325 MG PO TABS
1.0000 | ORAL_TABLET | Freq: Four times a day (QID) | ORAL | Status: DC | PRN
Start: 2014-10-08 — End: 2014-10-08
  Administered 2014-10-08: 1 via ORAL

## 2014-10-08 MED ORDER — LIDOCAINE-PRILOCAINE 2.5-2.5 % EX CREA: 1.0000 "application " | TOPICAL_CREAM | CUTANEOUS | Status: DC | PRN

## 2014-10-08 MED ORDER — SODIUM CHLORIDE 0.9 % IV SOLN: 100.0000 mL | INTRAVENOUS | Status: DC | PRN

## 2014-10-08 NOTE — Progress Notes (Signed)
Subjective: Interval History: has no complaint. Feels better, wants to go home. Knows he has to change habits..  Objective: Vital signs in last 24 hours: Temp:  [98 F (36.7 C)-98.7 F (37.1 C)] 98 F (36.7 C) (03/19 0738) Pulse Rate:  [65-91] 65 (03/19 0912) Resp:  [11-25] 16 (03/19 0912) BP: (155-200)/(100-145) 180/114 mmHg (03/19 0912) SpO2:  [90 %-100 %] 99 % (03/19 0738) Weight:  [77.2 kg (170 lb 3.1 oz)-81.4 kg (179 lb 7.3 oz)] 77.2 kg (170 lb 3.1 oz) (03/18 1556) Weight change:   Intake/Output from previous day: 03/18 0701 - 03/19 0700 In: 300 [P.O.:300] Out: 4151  Intake/Output this shift:    General appearance: dry skin, icthyotic, chronically ill Resp: diminished breath sounds bilat and rhonchi occ Chest wall: RIJ cath Cardio: S1, S2 normal, systolic murmur: holosystolic 2/6, blowing at apex and LV lift GI: pos bs, liver down 5 cm Extremities: edema 1+  PD cath R mid abdm  Lab Results:  Recent Labs  10/07/14 0220 10/08/14 0815  WBC 6.8 3.5*  HGB 7.9* 7.4*  HCT 24.9* 22.9*  PLT 173 168   BMET:  Recent Labs  10/07/14 0220 10/08/14 0824  NA 134* 135  K 4.8 3.9  CL 99 99  CO2 22 27  GLUCOSE 88 123*  BUN 53* 26*  CREATININE 14.42* 9.12*  CALCIUM 7.8* 7.5*   No results for input(s): PTH in the last 72 hours. Iron Studies: No results for input(s): IRON, TIBC, TRANSFERRIN, FERRITIN in the last 72 hours.  Studies/Results: Dg Chest 2 View  10/07/2014   CLINICAL DATA:  Chest pain and dyspnea for 2 days.  EXAM: CHEST  2 VIEW  COMPARISON:  None.  FINDINGS: There is a right jugular central line extending into the right atrium. There is moderate unchanged cardiomegaly. There is mild vascular and interstitial prominence which likely represents a degree of congestive heart failure. There are no effusions. There is no airspace consolidation.  IMPRESSION: Mild congestive heart failure.   Electronically Signed   By: Andreas Newport M.D.   On: 10/07/2014 02:58    Dg Cervical Spine Complete  10/07/2014   CLINICAL DATA:  Left upper extremity pain/radicular type symptoms  EXAM: CERVICAL SPINE  4+ VIEWS  COMPARISON:  None.  FINDINGS: Frontal, lateral, open-mouth odontoid, and bilateral oblique views were obtained. There is no fracture or spondylolisthesis. Prevertebral soft tissues and predental space regions are normal. There is moderate narrowing at C5-6. There is slight disc space narrowing at C6-7. There is no appreciable exit foraminal narrowing on the oblique views. There is calcification in the anterior ligament at C4-5 and C5-6. There is a focal area of calcification in the right carotid artery.  IMPRESSION: Areas of osteoarthritic change. No fracture or spondylolisthesis. Focal carotid artery calcification on the right.   Electronically Signed   By: Lowella Grip III M.D.   On: 10/07/2014 11:15   Ct Angio Chest Pe W/cm &/or Wo Cm  10/07/2014   CLINICAL DATA:  Dyspnea for 4 days with pain radiating into left upper extremity.  EXAM: CT ANGIOGRAPHY CHEST WITH CONTRAST  TECHNIQUE: Multidetector CT imaging of the chest was performed using the standard protocol during bolus administration of intravenous contrast. Multiplanar CT image reconstructions and MIPs were obtained to evaluate the vascular anatomy.  CONTRAST:  56m OMNIPAQUE IOHEXOL 350 MG/ML SOLN  COMPARISON:  08/30/2014  FINDINGS: The pulmonary vasculature is well opacified with no evidence of pulmonary embolism. There is mild aneurysmal enlargement of the proximal  descending aorta, measuring 3.7 cm. The ascending aorta is normal in caliber. The mid the distal portions of the descending aorta are normal in caliber. This is unchanged from 08/30/2014.  There are no effusions. There is no mediastinal or hilar adenopathy. Esophagus appears unremarkable.  There is airspace opacity in the right upper lobe which may represent infiltrate or aspiration. The left lung is clear.  Upper abdomen is remarkable for  atrophic kidneys and a large liver.  Review of the MIP images confirms the above findings.  IMPRESSION: 1. Negative for pulmonary embolism 2. Mild aneurysmal enlargement of the proximal descending thoracic aorta. Recommend annual imaging followup by CTA or MRA. This recommendation follows 2010 ACCF/AHA/AATS/ACR/ASA/SCA/SCAI/SIR/STS/SVM Guidelines for the Diagnosis and Management of Patients with Thoracic Aortic Disease. Circulation.2010; 121: B749-T552 3. Right upper lobe infiltrate, possibly infectious.   Electronically Signed   By: Andreas Newport M.D.   On: 10/07/2014 06:22    I have reviewed the patient's current medications. Prior to Admission:  Prescriptions prior to admission  Medication Sig Dispense Refill Last Dose  . ALPRAZolam (XANAX) 0.25 MG tablet Take 0.25 mg by mouth daily as needed. For sleep   10/05/2014 at Unknown time  . amLODipine (NORVASC) 5 MG tablet Take 10 mg by mouth daily.   10/05/2014 at Unknown time  . atorvastatin (LIPITOR) 40 MG tablet Take 1 tablet (40 mg total) by mouth daily at 6 PM. (Patient not taking: Reported on 09/19/2014) 30 tablet 1 Not Taking at Unknown time  . carvedilol (COREG) 25 MG tablet Take 25 mg by mouth 3 (three) times daily.    10/05/2014 at 1800  . cinacalcet (SENSIPAR) 30 MG tablet Take 30 mg by mouth daily.   10/05/2014 at Unknown time  . cloNIDine (CATAPRES - DOSED IN MG/24 HR) 0.3 mg/24hr patch Place 1 patch onto the skin. Every 7 days   10/04/2014  . Ferric Citrate 210 MG TABS Take 210 mg by mouth 3 (three) times daily with meals. Take 1 with each meal and 1 with a snack.   10/05/2014 at Unknown time  . gentamicin cream (GARAMYCIN) 0.1 % Apply to Tenchkoff site   Past Week at Unknown time  . hydrALAZINE (APRESOLINE) 25 MG tablet Take 100 mg by mouth 3 (three) times daily.    10/05/2014 at Unknown time  . hydrocortisone 1 % lotion Apply to site with itch as needed twice daily   Past Week at Unknown time  . levalbuterol (XOPENEX HFA) 45 MCG/ACT  inhaler Inhale 2 puffs into the lungs every 4 (four) hours as needed for wheezing or shortness of breath. 1 Inhaler 0 10/05/2014 at Unknown time  . methocarbamol (ROBAXIN-750) 750 MG tablet Take 1 tablet (750 mg total) by mouth every 6 (six) hours as needed for muscle spasms. 40 tablet 0 10/05/2014 at Unknown time  . minoxidil (LONITEN) 2.5 MG tablet Take 1 tablet (2.5 mg total) by mouth daily. (Patient taking differently: Take 5 mg by mouth daily. ) 30 tablet 0 09/18/2014 at Unknown time  . nabumetone (RELAFEN) 500 MG tablet Take 500 mg by mouth 2 (two) times daily.   10/05/2014 at Unknown time  . NIFEdipine (PROCARDIA-XL/ADALAT CC) 30 MG 24 hr tablet Take 1 tablet (30 mg total) by mouth daily. (Patient not taking: Reported on 09/19/2014) 30 tablet 0 Not Taking at Unknown time  . omeprazole (PRILOSEC) 20 MG capsule Take 20 mg by mouth daily.   10/05/2014 at Unknown time  . oxyCODONE (ROXICODONE) 5 MG immediate release tablet  Take 1 tablet (5 mg total) by mouth every 4 (four) hours as needed for severe pain. 10 tablet 0   . polyethylene glycol (MIRALAX) packet Take 17 g by mouth daily. For constipation (Patient taking differently: Take 17 g by mouth daily as needed for mild constipation. For constipation) 14 each 0 Past Month at Unknown time  . predniSONE (DELTASONE) 10 MG tablet Take 10 mg by mouth daily with breakfast.   10/05/2014 at Unknown time  . sevelamer carbonate (RENVELA) 800 MG tablet Take 1,600 mg by mouth 3 (three) times daily with meals.   10/05/2014 at Unknown time  . sulfamethoxazole-trimethoprim (BACTRIM,SEPTRA) 400-80 MG per tablet Take 1 tablet by mouth every Monday, Wednesday, and Friday.   Past Week at Unknown time  . tacrolimus (PROGRAF) 1 MG capsule Take 2 mg by mouth 2 (two) times daily.   10/05/2014 at Unknown time  . traMADol (ULTRAM) 50 MG tablet Take 2 tablets (100 mg total) by mouth every 6 (six) hours as needed. (Patient not taking: Reported on 09/14/2014) 16 tablet 0 Completed Course  at Unknown time  . warfarin (COUMADIN) 2 MG tablet Take 2 mg by mouth daily. Take along with warfarin 90m to equal 787m   10/05/2014 at Unknown time  . warfarin (COUMADIN) 5 MG tablet Take 1.5 tablets (7.5 mg total) by mouth one time only at 6 PM. Take 49m249maily at 6pm (Patient taking differently: Take 5 mg by mouth daily. ) 30 tablet 0 09/18/2014 at Unknown time  . warfarin (COUMADIN) 5 MG tablet Take 5 mg by mouth daily. Take along with Warfarin 2mg34m equal 7mg 76m/16/2016 at Unknown time    Assessment/Plan: 1 ESRD for HD.  Vol xs, uremic.  2 nd HD .  Need reg HD with solute, vol control 2 BP improving with extensive vol off and meds. Will see what is after HD 3 Anemia on epo 4 HPTH 5 Gout 6 non functional transplant tapering meds 7 substance abuse 8 Nonadherence primary issue discussed 9 Resp infx suspect viral P HD, bp meds, lowered Prograf.    LOS: 1 day   Denicia Pagliarulo L 10/08/2014,9:34 AM

## 2014-10-08 NOTE — Progress Notes (Addendum)
Subjective:  Pt still having productive cough with chest pain on deep inspiration.   Objective: Vital signs in last 24 hours: Filed Vitals:   10/08/14 0800 10/08/14 0830 10/08/14 0852 10/08/14 0912  BP: 173/112 167/113 181/115 180/114  Pulse: 70 75 76 65  Temp:      TempSrc:      Resp: _0 Weight:      SpO2:       Weight change:   Intake/Output Summary (Last 24 hours) at 10/08/14 0942 Last data filed at 10/08/14 0600  Gross per 24 hour  Intake    300 ml  Output   4151 ml  Net  -3851 ml   General: NAD, laying in bed comfortably in HD Lungs: CTAB, no wheezing Cardiac: RRR, no murmurs GI: soft, active bowel sounds Neuro: CN II-XII grossly intact Skin: dry scaly skin on all extremities   Lab Results: Basic Metabolic Panel:  Recent Labs Lab 10/01/14 1655  10/07/14 0220 10/08/14 0824  NA 133*  < > 134* 135  K 5.4*  < > 4.8 3.9  CL 96  < > 99 99  CO2 25  < > 22 27  GLUCOSE 96  < > 88 123*  BUN 44*  < > 53* 26*  CREATININE 11.95*  < > 14.42* 9.12*  CALCIUM 8.0*  < > 7.8* 7.5*  PHOS 4.8*  --   --   --   < > = values in this interval not displayed. Liver Function Tests:  Recent Labs Lab 10/01/14 1655  ALBUMIN 2.6*   CBC:  Recent Labs Lab 10/07/14 0220 10/08/14 0815  WBC 6.8 3.5*  NEUTROABS 4.6 2.2  HGB 7.9* 7.4*  HCT 24.9* 22.9*  MCV 80.1 78.2  PLT 173 168   Cardiac Enzymes:  Recent Labs Lab 10/06/14 0813 10/07/14 1830 10/07/14 2155  TROPONINI 0.08* 0.05* 0.05*   CBG:  Recent Labs Lab 10/07/14 1635  GLUCAP 106*   Coagulation:  Recent Labs Lab 10/02/14 0305 10/06/14 0532 10/08/14 0824  LABPROT 24.1* 18.6* 22.3*  INR 2.14* 1.54* 1.94*   Urine Drug Screen: Drugs of Abuse     Component Value Date/Time   LABOPIA NONE DETECTED 09/17/2013 0604   COCAINSCRNUR NONE DETECTED 09/17/2013 0604   LABBENZ NONE DETECTED 09/17/2013 0604   AMPHETMU NONE DETECTED 09/17/2013 0604   THCU POSITIVE* 09/17/2013 0604   LABBARB NONE  DETECTED 09/17/2013 0604      Micro Results: Recent Results (from the past 240 hour(s))  Culture, blood (routine x 2)     Status: None (Preliminary result)   Collection Time: 10/07/14  7:20 AM  Result Value Ref Range Status   Specimen Description BLOOD RIGHT HAND  Final   Special Requests BOTTLES DRAWN AEROBIC AND ANAEROBIC 5CCS  Final   Culture   Final           BLOOD CULTURE RECEIVED NO GROWTH TO DATE CULTURE WILL BE HELD FOR 5 DAYS BEFORE ISSUING A FINAL NEGATIVE REPORT Performed at [facility]    Report Status PENDING  Incomplete  Culture, blood (routine x 2)     Status: None (Preliminary result)   Collection Time: 10/07/14  7:25 AM  Result Value Ref Range Status   Specimen Description BLOOD LEFT HAND  Final   Special Requests BOTTLES DRAWN AEROBIC AND ANAEROBIC 5CCS  Final   Culture   Final           BLOOD CULTURE RECEIVED NO GROWTH TO  DATE CULTURE WILL BE HELD FOR 5 DAYS BEFORE ISSUING A FINAL NEGATIVE REPORT Performed at [facility]    Report Status PENDING  Incomplete  MRSA PCR Screening     Status: None   Collection Time: 10/07/14  6:50 PM  Result Value Ref Range Status   MRSA by PCR NEGATIVE NEGATIVE Final    Comment:        The GeneXpert MRSA Assay (FDA approved for NASAL specimens only), is one component of a comprehensive MRSA colonization surveillance program. It is not intended to diagnose MRSA infection nor to guide or monitor treatment for MRSA infections.    Studies/Results: Dg Chest 2 View  10/07/2014   CLINICAL DATA:  Chest pain and dyspnea for 2 days.  EXAM: CHEST  2 VIEW  COMPARISON:  None.  FINDINGS: There is a right jugular central line extending into the right atrium. There is moderate unchanged cardiomegaly. There is mild vascular and interstitial prominence which likely represents a degree of congestive heart failure. There are no effusions. There is no airspace consolidation.  IMPRESSION: Mild congestive heart failure.    Electronically Signed   By: Andreas Newport M.D.   On: 10/07/2014 02:58   Dg Cervical Spine Complete  10/07/2014   CLINICAL DATA:  Left upper extremity pain/radicular type symptoms  EXAM: CERVICAL SPINE  4+ VIEWS  COMPARISON:  None.  FINDINGS: Frontal, lateral, open-mouth odontoid, and bilateral oblique views were obtained. There is no fracture or spondylolisthesis. Prevertebral soft tissues and predental space regions are normal. There is moderate narrowing at C5-6. There is slight disc space narrowing at C6-7. There is no appreciable exit foraminal narrowing on the oblique views. There is calcification in the anterior ligament at C4-5 and C5-6. There is a focal area of calcification in the right carotid artery.  IMPRESSION: Areas of osteoarthritic change. No fracture or spondylolisthesis. Focal carotid artery calcification on the right.   Electronically Signed   By: Lowella Grip III M.D.   On: 10/07/2014 11:15   Ct Angio Chest Pe W/cm &/or Wo Cm  10/07/2014   CLINICAL DATA:  Dyspnea for 4 days with pain radiating into left upper extremity.  EXAM: CT ANGIOGRAPHY CHEST WITH CONTRAST  TECHNIQUE: Multidetector CT imaging of the chest was performed using the standard protocol during bolus administration of intravenous contrast. Multiplanar CT image reconstructions and MIPs were obtained to evaluate the vascular anatomy.  CONTRAST:  33m OMNIPAQUE IOHEXOL 350 MG/ML SOLN  COMPARISON:  08/30/2014  FINDINGS: The pulmonary vasculature is well opacified with no evidence of pulmonary embolism. There is mild aneurysmal enlargement of the proximal descending aorta, measuring 3.7 cm. The ascending aorta is normal in caliber. The mid the distal portions of the descending aorta are normal in caliber. This is unchanged from 08/30/2014.  There are no effusions. There is no mediastinal or hilar adenopathy. Esophagus appears unremarkable.  There is airspace opacity in the right upper lobe which may represent infiltrate or  aspiration. The left lung is clear.  Upper abdomen is remarkable for atrophic kidneys and a large liver.  Review of the MIP images confirms the above findings.  IMPRESSION: 1. Negative for pulmonary embolism 2. Mild aneurysmal enlargement of the proximal descending thoracic aorta. Recommend annual imaging followup by CTA or MRA. This recommendation follows 2010 ACCF/AHA/AATS/ACR/ASA/SCA/SCAI/SIR/STS/SVM Guidelines for the Diagnosis and Management of Patients with Thoracic Aortic Disease. Circulation.2010; 121: eZ563-O7563. Right upper lobe infiltrate, possibly infectious.   Electronically Signed   By: DValerie RoysD.  On: 10/07/2014 06:22   Medications:  Prior to Admission:  Prescriptions prior to admission  Medication Sig Dispense Refill Last Dose  . ALPRAZolam (XANAX) 0.25 MG tablet Take 0.25 mg by mouth daily as needed. For sleep   10/05/2014 at Unknown time  . amLODipine (NORVASC) 5 MG tablet Take 10 mg by mouth daily.   10/05/2014 at Unknown time  . atorvastatin (LIPITOR) 40 MG tablet Take 1 tablet (40 mg total) by mouth daily at 6 PM. (Patient not taking: Reported on 09/19/2014) 30 tablet 1 Not Taking at Unknown time  . carvedilol (COREG) 25 MG tablet Take 25 mg by mouth 3 (three) times daily.    10/05/2014 at 1800  . cinacalcet (SENSIPAR) 30 MG tablet Take 30 mg by mouth daily.   10/05/2014 at Unknown time  . cloNIDine (CATAPRES - DOSED IN MG/24 HR) 0.3 mg/24hr patch Place 1 patch onto the skin. Every 7 days   10/04/2014  . Ferric Citrate 210 MG TABS Take 210 mg by mouth 3 (three) times daily with meals. Take 1 with each meal and 1 with a snack.   10/05/2014 at Unknown time  . gentamicin cream (GARAMYCIN) 0.1 % Apply to [place] site   Past Week at Unknown time  . hydrALAZINE (APRESOLINE) 25 MG tablet Take 100 mg by mouth 3 (three) times daily.    10/05/2014 at Unknown time  . hydrocortisone 1 % lotion Apply to site with itch as needed twice daily   Past Week at Unknown time  . levalbuterol  (XOPENEX HFA) 45 MCG/ACT inhaler Inhale 2 puffs into the lungs every 4 (four) hours as needed for wheezing or shortness of breath. 1 Inhaler 0 10/05/2014 at Unknown time  . methocarbamol (ROBAXIN-750) 750 MG tablet Take 1 tablet (750 mg total) by mouth every 6 (six) hours as needed for muscle spasms. 40 tablet 0 10/05/2014 at Unknown time  . minoxidil (LONITEN) 2.5 MG tablet Take 1 tablet (2.5 mg total) by mouth daily. (Patient taking differently: Take 5 mg by mouth daily. ) 30 tablet 0 09/18/2014 at Unknown time  . nabumetone (RELAFEN) 500 MG tablet Take 500 mg by mouth 2 (two) times daily.   10/05/2014 at Unknown time  . NIFEdipine (PROCARDIA-XL/ADALAT CC) 30 MG 24 hr tablet Take 1 tablet (30 mg total) by mouth daily. (Patient not taking: Reported on 09/19/2014) 30 tablet 0 Not Taking at Unknown time  . omeprazole (PRILOSEC) 20 MG capsule Take 20 mg by mouth daily.   10/05/2014 at Unknown time  . oxyCODONE (ROXICODONE) 5 MG immediate release tablet Take 1 tablet (5 mg total) by mouth every 4 (four) hours as needed for severe pain. 10 tablet 0   . polyethylene glycol (MIRALAX) packet Take 17 g by mouth daily. For constipation (Patient taking differently: Take 17 g by mouth daily as needed for mild constipation. For constipation) 14 each 0 Past Month at Unknown time  . predniSONE (DELTASONE) 10 MG tablet Take 10 mg by mouth daily with breakfast.   10/05/2014 at Unknown time  . sevelamer carbonate (RENVELA) 800 MG tablet Take 1,600 mg by mouth 3 (three) times daily with meals.   10/05/2014 at Unknown time  . sulfamethoxazole-trimethoprim (BACTRIM,SEPTRA) 400-80 MG per tablet Take 1 tablet by mouth every Monday, Wednesday, and Friday.   Past Week at Unknown time  . tacrolimus (PROGRAF) 1 MG capsule Take 2 mg by mouth 2 (two) times daily.   10/05/2014 at Unknown time  . traMADol (ULTRAM) 50 MG tablet Take  2 tablets (100 mg total) by mouth every 6 (six) hours as needed. (Patient not taking: Reported on 09/14/2014) 16  tablet 0 Completed Course at Unknown time  . warfarin (COUMADIN) 2 MG tablet Take 2 mg by mouth daily. Take along with warfarin 62m to equal 7106m   10/05/2014 at Unknown time  . warfarin (COUMADIN) 5 MG tablet Take 1.5 tablets (7.5 mg total) by mouth one time only at 6 PM. Take 28m12maily at 6pm (Patient taking differently: Take 5 mg by mouth daily. ) 30 tablet 0 09/18/2014 at Unknown time  . warfarin (COUMADIN) 5 MG tablet Take 5 mg by mouth daily. Take along with Warfarin 2mg60m equal 7mg 628m/16/2016 at Unknown time   Scheduled Meds: . amLODipine  10 mg Oral QHS  . atorvastatin  40 mg Oral q1800  . carvedilol  25 mg Oral TID  . cinacalcet  30 mg Oral Q breakfast  . cloNIDine  0.3 mg Transdermal Weekly  . doxercalciferol      . doxercalciferol  2.5 mcg Intravenous Q T,Th,Sa-HD  . heparin  3,500 Units Dialysis Once in dialysis  . minoxidil  2.5 mg Oral Daily  . NIFEdipine  30 mg Oral QHS  . oxyCODONE-acetaminophen      . pantoprazole  40 mg Oral Daily  . predniSONE  10 mg Oral Q breakfast  . sevelamer carbonate  1,600 mg Oral TID WC  . sulfamethoxazole-trimethoprim  1 tablet Oral Q M,W,F  . tacrolimus  1 mg Oral BID  . Warfarin - Pharmacist Dosing Inpatient   Does not apply q1800   Continuous Infusions: . sodium chloride Stopped (10/07/14 0551)   PRN Meds:.sodium chloride, sodium chloride, ALPRAZolam, alteplase, feeding supplement (NEPRO CARB STEADY), heparin, ketorolac, lidocaine (PF), lidocaine-prilocaine, oxyCODONE-acetaminophen, pentafluoroprop-tetrafluoroeth Assessment/Plan: Principal Problem:   HCAP (healthcare-associated pneumonia) Active Problems:   Anemia of chronic kidney failure   CHF (congestive heart failure)   Generalized anxiety disorder   Chronic anticoagulation   Prolonged Q-T interval on ECG   Hypertensive urgency   End-stage renal disease on hemodialysis   Immunosuppression  Probable HCAP: Etiology of his pleuritic chest pain is not clear at this time. There  is a questionable right upper lobe are infiltrate, raising a concern for HCAP in a patient who is on chronic immunosuppression. Two-view chest x-ray did not reveal infiltrate, but reported mild congestive heart failure. Patient reported a fever of 99.9 but has been a febrile here. No leukocytosis.  -Continue with vancomycin and cefepime and follow blood cultures, currently NGTD - HIV, strep urine antigen, Legionella urine antigen, and sputum Gram stain and culture ordered - transfer to hospitalist service who will see him tomorrow.   Left-sided chest pain: Patient has chronic troponin elevation 2/2 ESRD, trended down to 0.05 from 0.09 on admission. Left heart catheterization 1 month ago revealed mild CAD. Chest CTA revealed mild aneurysmal enlargement in the proximal descending thoracic aorta. Unlikely that this is accounting for his chest pain. Recommendation by the radiology to follow-up with CTA or MRA annually. cervical spine x-ray neg for cervical radiculopathy. Chest pain has improved, mild discomfort on deep inspiration. - will continue to montior  Hypertensive urgency: Blood pressure severely elevate w/ SBP in the 200's, BP still elevated ranging from 161-191 overnight. Patient on multiple outpatient medications for blood pressure including amlodipine 10 mg daily, Coreg 25 mg TID clonidine patch 0.3 mg every 24 hours, hydralazine 100 mg 3 times a day, nifedipine 30 mg daily, and minoxidil 2.5  mg daily. - continue all home BP meds, however changed coreg 62m TID to BID.   ESRD on HD TTS: Status post kidney transplant 2006 >>failed. Last dialysis on Tuesday. Has PD catheter in place and right IJ HD catheter. Both without evidence of infection. No acute electrolyte abnormalities on BMP.  -nephrology following for hemodialysis -Continue with prednisone 126m- stopped tacrolimus as has failed kidney transplant and now on HD.  - held bactrim 3/weekly as he is on vanc currently, will resume on  discharge  Chronic Systolic CHF: No evidence of acute volume overload to suggest exacerbation: Echocardiogram on 10/02/2014 with EF of 30-35%. Severe LAE. Cath in February 2013 revealed mild CAD, but he does have diffuse hypokinesis. Likely nonischemic cardiomyopathy. Chest x-ray revealed mild congestive heart failure. -Continue with Coreg as above.  Anxiety disorder: Cont home Xanax 0.25 mg daily prn  Anemia of chronic renal disease: Hemoglobin 7.4. Stable. No acute bleeding. Will monitor  History of pulmonary embolism: CTA 05/08/2014 revealed acute sub-segmental pulmonary embolism on the left upper lobe. Patient on Coumadin. CTA neg this admission.  -Continue with Coumadin per pharmacy  Code Status: Full code   F/E/N: heart healthy diet   VTE Ppx: Cont with Coumadin per pharmacy   Dispo: Disposition is deferred at this time, awaiting improvement of current medical problems.  The patient does have a current PCP (VLance BoschNP), therefore is not require OPC follow-up after discharge.   The patient does not have transportation limitations that hinder transportation to clinic appointments.    .Services Needed at time of discharge: Y = Yes, Blank = No PT:   OT:   RN:   Equipment:   Other:     LOS: 1 day   DiNorman HerrlichMD 10/08/2014, 9:42 AM

## 2014-10-08 NOTE — Progress Notes (Signed)
Patient " doesn't feel like doing EKG" at this time.Will attempt later. Itzael Liptak, [name], Therapist, sports

## 2014-10-08 NOTE — Procedures (Signed)
I was present at this session.  I have reviewed the session itself and made appropriate changes.  HD via RIJ PC.  bp coming down with vol off . Flow 400.   Frank [name] L 3/19/20169:33 AM

## 2014-10-08 NOTE — Discharge Instructions (Signed)

## 2014-10-08 NOTE — Progress Notes (Signed)
Patient c/o shoulder and back pain,offered toradol but patient refused." I don't take that medicine,I take oxycodone at home and dilaudid here." MD on call notified. Frank [name], [name], Therapist, sports

## 2014-10-08 NOTE — Progress Notes (Signed)
ANTICOAGULATION CONSULT NOTE - Follow up [place] for warfarin Indication: VTE treatment  Allergies  Allergen Reactions  . Ferrlecit [Na Ferric Gluc Cplx In Sucrose] Shortness Of Breath, Swelling and Other (See Comments)    Swelling in throat  . Darvocet [Propoxyphene N-Acetaminophen] Hives    Patient Measurements: Weight:  (uta, bed scale broken, pt refuses to stand for weight)   Vital Signs: Temp: 98 F (36.7 C) (03/19 0738) Temp Source: Oral (03/19 0738) BP: 180/114 mmHg (03/19 0912) Pulse Rate: 65 (03/19 0912)  Labs:  Recent Labs  10/06/14 0532 10/06/14 0813 10/07/14 0220 10/07/14 1830 10/07/14 2155 10/08/14 0815 10/08/14 0824  HGB 8.3*  --  7.9*  --   --  7.4*  --   HCT 26.3*  --  24.9*  --   --  22.9*  --   PLT 165  --  173  --   --  168  --   LABPROT 18.6*  --   --   --   --   --  22.3*  INR 1.54*  --   --   --   --   --  1.94*  CREATININE 13.06*  --  14.42*  --   --   --  9.12*  TROPONINI 0.09* 0.08*  --  0.05* 0.05*  --   --     Estimated Creatinine Clearance: 10.6 mL/min (by C-G formula based on Cr of 9.12).   Medical History: Past Medical History  Diagnosis Date  . Hypertension   . Depression   . Complication of anesthesia     itching, sore throat  . Diabetes mellitus without complication     No history per patient, but remains under history as A1c would not be accurate given on dialysis  . Shortness of breath   . Anxiety   . ESRD (end stage renal disease)     due to HTN per patient, followed at [facility], s/p failed kidney transplant - dialysis Tue, Th, Sat  . Renal insufficiency   . CHF (congestive heart failure)   . Anemia     Assessment: 65 YOM with history of PE in 04/2014 on warfarin. INR low on admit at 1.54 on 3/17, history of noncompliance. Pharmacy consulted to dose warfarin on 3/18, received 54m last night and INR 1.94 this AM. Plt 168, Hgb low at 7.4, no bleeding noted.  Home warfarin dose = 753mdaily  Goal of  Therapy:  INR 2-3 Monitor platelets by anticoagulation protocol: Yes   Plan:  - Warfarin 7.59m83mo x1 tonight - Daily PT/INR - Monitor for s/sx bleeding  Yunus Stoklosa E. Cleotis Sparr, Pharm.D Clinical Pharmacy Resident Pager: [phone] 9:55 AM

## 2014-10-09 DIAGNOSIS — J189 Pneumonia, unspecified organism: Principal | ICD-10-CM

## 2014-10-09 DIAGNOSIS — Z992 Dependence on renal dialysis: Secondary | ICD-10-CM

## 2014-10-09 DIAGNOSIS — N186 End stage renal disease: Secondary | ICD-10-CM

## 2014-10-09 DIAGNOSIS — I10 Essential (primary) hypertension: Secondary | ICD-10-CM

## 2014-10-09 DIAGNOSIS — R0602 Shortness of breath: Secondary | ICD-10-CM

## 2014-10-09 LAB — PROTIME-INR
INR: 2.26 — ABNORMAL HIGH (ref 0.00–1.49)
Prothrombin Time: 25.1 seconds — ABNORMAL HIGH (ref 11.6–15.2)

## 2014-10-09 MED ORDER — MINOXIDIL 2.5 MG PO TABS
2.5000 mg | ORAL_TABLET | Freq: Two times a day (BID) | ORAL | Status: DC
  Administered 2014-10-09 – 2014-10-10 (×2): 2.5 mg via ORAL
  Filled 2014-10-09 (×3): qty 1

## 2014-10-09 MED ORDER — LISINOPRIL 10 MG PO TABS
10.0000 mg | ORAL_TABLET | Freq: Every day | ORAL | Status: DC
  Administered 2014-10-09 – 2014-10-10 (×2): 10 mg via ORAL
  Filled 2014-10-09 (×2): qty 1

## 2014-10-09 MED ORDER — WARFARIN SODIUM 7.5 MG PO TABS
7.5000 mg | ORAL_TABLET | Freq: Once | ORAL | Status: AC
  Administered 2014-10-09: 7.5 mg via ORAL
  Filled 2014-10-09: qty 1

## 2014-10-09 NOTE — Progress Notes (Signed)
Subjective: Interval History: has no complaint, feels better after 2 HD, discussed reg and staying on.  Objective: Vital signs in last 24 hours: Temp:  [98 F (36.7 C)-98.9 F (37.2 C)] 98 F (36.7 C) (03/20 0843) Pulse Rate:  [64-81] 75 (03/20 0843) Resp:  [16-183] 18 (03/20 0843) BP: (139-196)/(88-124) 162/100 mmHg (03/20 0843) SpO2:  [96 %-100 %] 99 % (03/20 0843) Weight change:   Intake/Output from previous day: 03/19 0701 - 03/20 0700 In: 720 [P.O.:720] Out: 2869  Intake/Output this shift: Total I/O In: 300 [P.O.:300] Out: 0   General appearance: alert, cooperative and chronically ill, peeling skin Resp: dullness to percussion bilaterally and rhonchi bilaterally Chest wall: RIJ cath Cardio: S1, S2 normal, systolic murmur: holosystolic 2/6, blowing at apex and LV lift GI: pos bs, PD cath R mid abdm Extremities: extremities normal, atraumatic, no cyanosis or edema  Lab Results:  Recent Labs  10/08/14 0815 10/08/14 1947  WBC 3.5* 4.5  HGB 7.4* 8.0*  HCT 22.9* 25.2*  PLT 168 188   BMET:  Recent Labs  10/08/14 0824 10/08/14 1947  NA 135 132*  K 3.9 4.2  CL 99 96  CO2 27 30  GLUCOSE 123* 131*  BUN 26* 14  CREATININE 9.12* 5.59*  CALCIUM 7.5* 7.4*   No results for input(s): PTH in the last 72 hours. Iron Studies: No results for input(s): IRON, TIBC, TRANSFERRIN, FERRITIN in the last 72 hours.  Studies/Results: Dg Cervical Spine Complete  10/07/2014   CLINICAL DATA:  Left upper extremity pain/radicular type symptoms  EXAM: CERVICAL SPINE  4+ VIEWS  COMPARISON:  None.  FINDINGS: Frontal, lateral, open-mouth odontoid, and bilateral oblique views were obtained. There is no fracture or spondylolisthesis. Prevertebral soft tissues and predental space regions are normal. There is moderate narrowing at C5-6. There is slight disc space narrowing at C6-7. There is no appreciable exit foraminal narrowing on the oblique views. There is calcification in the anterior  ligament at C4-5 and C5-6. There is a focal area of calcification in the right carotid artery.  IMPRESSION: Areas of osteoarthritic change. No fracture or spondylolisthesis. Focal carotid artery calcification on the right.   Electronically Signed   By: Lowella Grip III M.D.   On: 10/07/2014 11:15    I have reviewed the patient's current medications.  Assessment/Plan: 1 ESRD s/p 2 HD with solute and vol off.  Needs reg HD 2 HTN improving with lower vol  And meds 3 Anemia epo 4 HPTH 5 NONADHERENCE primary issue 6 bronchitis 7 anticoag Rec will do HD in am, Need to use minoxidil bid if use as it has short t 1/2.  Can d/c anytime    LOS: 2 days   Alyas Creary L 10/09/2014,9:00 AM

## 2014-10-09 NOTE — Progress Notes (Signed)
Assessment/Plan: Principal Problem:   HCAP (healthcare-associated pneumonia) Active Problems:   Anemia of chronic kidney failure   CHF (congestive heart failure)   Generalized anxiety disorder   Chronic anticoagulation   Prolonged Q-T interval on ECG   Hypertensive urgency   End-stage renal disease on hemodialysis   Immunosuppression   Pneumonitis   Chest pain   SOB (shortness of breath)  HCAP:  -IV abx- change to PO after dialysis in AM -Continue with vancomycin and cefepime and follow blood cultures, currently NGTD - HIV neg -strep urine antigen, Legionella urine antigen, and sputum Gram stain and culture ordered pending   Left-sided chest pain: Patient has chronic troponin elevation 2/2 ESRD, trended down to 0.05 from 0.09 on admission. Left heart catheterization 1 month ago revealed mild CAD. Chest CTA revealed mild aneurysmal enlargement in the proximal descending thoracic aorta. Unlikely that this is accounting for his chest pain. Recommendation by the radiology to follow-up with CTA or MRA annually. cervical spine x-ray neg for cervical radiculopathy. Chest pain has improved, mild discomfort on deep inspiration. - suspect related to his uncontrolled BP  Hypertensive urgency: Blood pressure severely elevate w/ SBP in the 200's, BP still elevated ranging from 161-191 overnight. Patient on multiple outpatient medications for blood pressure including amlodipine 10 mg daily, Coreg 25 mg TID clonidine patch 0.3 mg every 24 hours, hydralazine 100 mg 3 times a day, nifedipine 30 mg daily, and minoxidil 2.5 mg daily. - continue all home BP meds, however changed coreg 42m TID to BID.   ESRD on HD TTS: Status post kidney transplant 2006 >>failed. Last dialysis on Tuesday. Has PD catheter in place and right IJ HD catheter. Both without evidence of infection. No acute electrolyte abnormalities on BMP.  -nephrology following for hemodialysis -Continue with prednisone 184m- stopped  tacrolimus as has failed kidney transplant and now on HD.  - held bactrim 3/weekly as he is on vanc currently, will resume on discharge  Chronic Systolic CHF: No evidence of acute volume overload to suggest exacerbation: Echocardiogram on 10/02/2014 with EF of 30-35%. Severe LAE. Cath in February 2013 revealed mild CAD, but he does have diffuse hypokinesis. Likely nonischemic cardiomyopathy. Chest x-ray revealed mild congestive heart failure. -Continue with Coreg as above.  Anxiety disorder: Cont home Xanax 0.25 mg daily prn  Anemia of chronic renal disease: Hemoglobin 7.4. Stable. No acute bleeding. Will monitor  History of pulmonary embolism: CTA 05/08/2014 revealed acute sub-segmental pulmonary embolism on the left upper lobe. Patient on Coumadin. CTA neg this admission.  -Continue with Coumadin per pharmacy  Code Status: Full code   F/E/N: heart healthy diet   VTE Ppx: Cont with Coumadin per pharmacy   Dispo: home after dialysis   Subjective: Feeling better   Objective: Vital signs in last 24 hours: Filed Vitals:   10/08/14 1816 10/08/14 2052 10/09/14 0454 10/09/14 0843  BP: 181/124 168/112 139/88 162/100  Pulse: 81 79 77 75  Temp: 98.6 F (37 C) 98.9 F (37.2 C) 98 F (36.7 C) 98 F (36.7 C)  TempSrc: Oral Oral Oral Oral  Resp: 183 _0 Weight:      SpO2: 100% 97% 96% 99%   Weight change:   Intake/Output Summary (Last 24 hours) at 10/09/14 1010 Last data filed at 10/09/14 0844  Gross per 24 hour  Intake   1020 ml  Output   2869 ml  Net  -1849 ml   General: NAD, eating Lungs: CTAB, no wheezing Cardiac:  RRR, no murmurs GI: soft, active bowel sounds     Lab Results: Basic Metabolic Panel:  Recent Labs Lab 10/08/14 0824 10/08/14 1947  NA 135 132*  K 3.9 4.2  CL 99 96  CO2 27 30  GLUCOSE 123* 131*  BUN 26* 14  CREATININE 9.12* 5.59*  CALCIUM 7.5* 7.4*   Liver Function Tests: No results for input(s): AST, ALT, ALKPHOS, BILITOT, PROT,  ALBUMIN in the last 168 hours. CBC:  Recent Labs Lab 10/08/14 0815 10/08/14 1947  WBC 3.5* 4.5  NEUTROABS 2.2 3.6  HGB 7.4* 8.0*  HCT 22.9* 25.2*  MCV 78.2 78.8  PLT 168 188   Cardiac Enzymes:  Recent Labs Lab 10/06/14 0813 10/07/14 1830 10/07/14 2155  TROPONINI 0.08* 0.05* 0.05*   CBG:  Recent Labs Lab 10/07/14 1635  GLUCAP 106*   Coagulation:  Recent Labs Lab 10/06/14 0532 10/08/14 0824 10/08/14 1947  LABPROT 18.6* 22.3* 22.5*  INR 1.54* 1.94* 1.96*   Urine Drug Screen: Drugs of Abuse     Component Value Date/Time   LABOPIA NONE DETECTED 09/17/2013 0604   COCAINSCRNUR NONE DETECTED 09/17/2013 0604   LABBENZ NONE DETECTED 09/17/2013 0604   AMPHETMU NONE DETECTED 09/17/2013 0604   THCU POSITIVE* 09/17/2013 0604   LABBARB NONE DETECTED 09/17/2013 0604      Micro Results: Recent Results (from the past 240 hour(s))  Culture, blood (routine x 2)     Status: None (Preliminary result)   Collection Time: 10/07/14  7:20 AM  Result Value Ref Range Status   Specimen Description BLOOD RIGHT HAND  Final   Special Requests BOTTLES DRAWN AEROBIC AND ANAEROBIC 5CCS  Final   Culture   Final           BLOOD CULTURE RECEIVED NO GROWTH TO DATE CULTURE WILL BE HELD FOR 5 DAYS BEFORE ISSUING A FINAL NEGATIVE REPORT Performed at [facility]    Report Status PENDING  Incomplete  Culture, blood (routine x 2)     Status: None (Preliminary result)   Collection Time: 10/07/14  7:25 AM  Result Value Ref Range Status   Specimen Description BLOOD LEFT HAND  Final   Special Requests BOTTLES DRAWN AEROBIC AND ANAEROBIC 5CCS  Final   Culture   Final           BLOOD CULTURE RECEIVED NO GROWTH TO DATE CULTURE WILL BE HELD FOR 5 DAYS BEFORE ISSUING A FINAL NEGATIVE REPORT Performed at [facility]    Report Status PENDING  Incomplete  MRSA PCR Screening     Status: None   Collection Time: 10/07/14  6:50 PM  Result Value Ref Range Status   MRSA by PCR  NEGATIVE NEGATIVE Final    Comment:        The GeneXpert MRSA Assay (FDA approved for NASAL specimens only), is one component of a comprehensive MRSA colonization surveillance program. It is not intended to diagnose MRSA infection nor to guide or monitor treatment for MRSA infections.    Studies/Results: Dg Cervical Spine Complete  10/07/2014   CLINICAL DATA:  Left upper extremity pain/radicular type symptoms  EXAM: CERVICAL SPINE  4+ VIEWS  COMPARISON:  None.  FINDINGS: Frontal, lateral, open-mouth odontoid, and bilateral oblique views were obtained. There is no fracture or spondylolisthesis. Prevertebral soft tissues and predental space regions are normal. There is moderate narrowing at C5-6. There is slight disc space narrowing at C6-7. There is no appreciable exit foraminal narrowing on the oblique views. There is calcification in  the anterior ligament at C4-5 and C5-6. There is a focal area of calcification in the right carotid artery.  IMPRESSION: Areas of osteoarthritic change. No fracture or spondylolisthesis. Focal carotid artery calcification on the right.   Electronically Signed   By: Lowella Grip III M.D.   On: 10/07/2014 11:15   Medications:  Prior to Admission:  Prescriptions prior to admission  Medication Sig Dispense Refill Last Dose  . ALPRAZolam (XANAX) 0.25 MG tablet Take 0.25 mg by mouth daily as needed. For sleep   10/05/2014 at Unknown time  . amLODipine (NORVASC) 5 MG tablet Take 10 mg by mouth daily.   10/05/2014 at Unknown time  . atorvastatin (LIPITOR) 40 MG tablet Take 1 tablet (40 mg total) by mouth daily at 6 PM. (Patient not taking: Reported on 09/19/2014) 30 tablet 1 Not Taking at Unknown time  . carvedilol (COREG) 25 MG tablet Take 25 mg by mouth 3 (three) times daily.    10/05/2014 at 1800  . cinacalcet (SENSIPAR) 30 MG tablet Take 30 mg by mouth daily.   10/05/2014 at Unknown time  . cloNIDine (CATAPRES - DOSED IN MG/24 HR) 0.3 mg/24hr patch Place 1 patch  onto the skin. Every 7 days   10/04/2014  . Ferric Citrate 210 MG TABS Take 210 mg by mouth 3 (three) times daily with meals. Take 1 with each meal and 1 with a snack.   10/05/2014 at Unknown time  . gentamicin cream (GARAMYCIN) 0.1 % Apply to [place] site   Past Week at Unknown time  . hydrALAZINE (APRESOLINE) 25 MG tablet Take 100 mg by mouth 3 (three) times daily.    10/05/2014 at Unknown time  . hydrocortisone 1 % lotion Apply to site with itch as needed twice daily   Past Week at Unknown time  . levalbuterol (XOPENEX HFA) 45 MCG/ACT inhaler Inhale 2 puffs into the lungs every 4 (four) hours as needed for wheezing or shortness of breath. 1 Inhaler 0 10/05/2014 at Unknown time  . methocarbamol (ROBAXIN-750) 750 MG tablet Take 1 tablet (750 mg total) by mouth every 6 (six) hours as needed for muscle spasms. 40 tablet 0 10/05/2014 at Unknown time  . minoxidil (LONITEN) 2.5 MG tablet Take 1 tablet (2.5 mg total) by mouth daily. (Patient taking differently: Take 5 mg by mouth daily. ) 30 tablet 0 09/18/2014 at Unknown time  . nabumetone (RELAFEN) 500 MG tablet Take 500 mg by mouth 2 (two) times daily.   10/05/2014 at Unknown time  . NIFEdipine (PROCARDIA-XL/ADALAT CC) 30 MG 24 hr tablet Take 1 tablet (30 mg total) by mouth daily. (Patient not taking: Reported on 09/19/2014) 30 tablet 0 Not Taking at Unknown time  . omeprazole (PRILOSEC) 20 MG capsule Take 20 mg by mouth daily.   10/05/2014 at Unknown time  . oxyCODONE (ROXICODONE) 5 MG immediate release tablet Take 1 tablet (5 mg total) by mouth every 4 (four) hours as needed for severe pain. 10 tablet 0   . polyethylene glycol (MIRALAX) packet Take 17 g by mouth daily. For constipation (Patient taking differently: Take 17 g by mouth daily as needed for mild constipation. For constipation) 14 each 0 Past Month at Unknown time  . predniSONE (DELTASONE) 10 MG tablet Take 10 mg by mouth daily with breakfast.   10/05/2014 at Unknown time  . sevelamer carbonate  (RENVELA) 800 MG tablet Take 1,600 mg by mouth 3 (three) times daily with meals.   10/05/2014 at Unknown time  . sulfamethoxazole-trimethoprim (BACTRIM,SEPTRA)  400-80 MG per tablet Take 1 tablet by mouth every Monday, Wednesday, and Friday.   Past Week at Unknown time  . tacrolimus (PROGRAF) 1 MG capsule Take 2 mg by mouth 2 (two) times daily.   10/05/2014 at Unknown time  . traMADol (ULTRAM) 50 MG tablet Take 2 tablets (100 mg total) by mouth every 6 (six) hours as needed. (Patient not taking: Reported on 09/14/2014) 16 tablet 0 Completed Course at Unknown time  . warfarin (COUMADIN) 2 MG tablet Take 2 mg by mouth daily. Take along with warfarin 86m to equal 757m   10/05/2014 at Unknown time  . warfarin (COUMADIN) 5 MG tablet Take 1.5 tablets (7.5 mg total) by mouth one time only at 6 PM. Take 7m14maily at 6pm (Patient taking differently: Take 5 mg by mouth daily. ) 30 tablet 0 09/18/2014 at Unknown time  . warfarin (COUMADIN) 5 MG tablet Take 5 mg by mouth daily. Take along with Warfarin 2mg87m equal 7mg 37m/16/2016 at Unknown time   Scheduled Meds: . amLODipine  10 mg Oral QHS  . atorvastatin  40 mg Oral q1800  . carvedilol  25 mg Oral BID WC  . ceFEPime (MAXIPIME) IV  2 g Intravenous Q T,Th,Sa-HD  . cinacalcet  30 mg Oral Q breakfast  . cloNIDine  0.3 mg Transdermal Weekly  . doxercalciferol  2.5 mcg Intravenous Q T,Th,Sa-HD  . hydrALAZINE  100 mg Oral TID  . minoxidil  2.5 mg Oral Daily  . NIFEdipine  30 mg Oral QHS  . pantoprazole  40 mg Oral Daily  . predniSONE  10 mg Oral Q breakfast  . sevelamer carbonate  1,600 mg Oral TID WC  . vancomycin  750 mg Intravenous Q T,Th,Sa-HD  . warfarin  7.5 mg Oral ONCE-1800  . Warfarin - Pharmacist Dosing Inpatient   Does not apply q1800   Continuous Infusions: . sodium chloride Stopped (10/07/14 0551)   PRN Meds:.ALPRAZolam, ketorolac, oxyCODONE-acetaminophen  35 min   LOS: 2 days   JessiGeradine Girt3/20/2016, 10:10 AM

## 2014-10-09 NOTE — Progress Notes (Signed)
ANTICOAGULATION CONSULT NOTE - Follow up [place] for warfarin Indication: VTE treatment  Allergies  Allergen Reactions  . Ferrlecit [Na Ferric Gluc Cplx In Sucrose] Shortness Of Breath, Swelling and Other (See Comments)    Swelling in throat  . Darvocet [Propoxyphene N-Acetaminophen] Hives    Patient Measurements: Weight:  (uta pt refuses to stand)   Vital Signs: Temp: 98 F (36.7 C) (03/20 0454) Temp Source: Oral (03/20 0454) BP: 139/88 mmHg (03/20 0454) Pulse Rate: 77 (03/20 0454)  Labs:  Recent Labs  10/07/14 0220 10/07/14 1830 10/07/14 2155 10/08/14 0815 10/08/14 0824 10/08/14 1947  HGB 7.9*  --   --  7.4*  --  8.0*  HCT 24.9*  --   --  22.9*  --  25.2*  PLT 173  --   --  168  --  188  LABPROT  --   --   --   --  22.3* 22.5*  INR  --   --   --   --  1.94* 1.96*  CREATININE 14.42*  --   --   --  9.12* 5.59*  TROPONINI  --  0.05* 0.05*  --   --   --     Estimated Creatinine Clearance: 17.3 mL/min (by C-G formula based on Cr of 5.59).   Medical History: Past Medical History  Diagnosis Date  . Hypertension   . Depression   . Complication of anesthesia     itching, sore throat  . Diabetes mellitus without complication     No history per patient, but remains under history as A1c would not be accurate given on dialysis  . Shortness of breath   . Anxiety   . ESRD (end stage renal disease)     due to HTN per patient, followed at [facility], s/p failed kidney transplant - dialysis Tue, Th, Sat  . Renal insufficiency   . CHF (congestive heart failure)   . Anemia     Assessment: 53 YOM with history of PE in 04/2014 on warfarin. INR low on admit at 1.54 on 3/17, history of noncompliance. Pharmacy consulted to dose warfarin on 3/18. INR up 1.94>>1.96 today after doses of 79m and 7.526m Still slightly subtherapeutic however not seeing effects of 1037mose yet. Will continue with 7.5mg19mnight and expect INR to trend up.  Plt 168, Hgb increasing  7.4>>8, no bleeding noted.  Home warfarin dose = 7mg 56mly  Goal of Therapy:  INR 2-3 Monitor platelets by anticoagulation protocol: Yes   Plan:  - Warfarin 7.5mg p73m1 tonight - Daily PT/INR - Monitor for s/sx bleeding  Keimani Laufer E. Allena Pietila, Pharm.D Clinical Pharmacy Resident Pager: [phone] 8:16 AM

## 2014-10-09 NOTE — Progress Notes (Signed)
UR Completed.  [phone]  

## 2014-10-10 DIAGNOSIS — R079 Chest pain, unspecified: Secondary | ICD-10-CM

## 2014-10-10 LAB — CBC
HCT: 23.8 % — ABNORMAL LOW (ref 39.0–52.0)
HEMOGLOBIN: 7.6 g/dL — AB (ref 13.0–17.0)
MCH: 25.7 pg — ABNORMAL LOW (ref 26.0–34.0)
MCHC: 31.9 g/dL (ref 30.0–36.0)
MCV: 80.4 fL (ref 78.0–100.0)
Platelets: 209 10*3/uL (ref 150–400)
RBC: 2.96 MIL/uL — ABNORMAL LOW (ref 4.22–5.81)
RDW: 18.6 % — ABNORMAL HIGH (ref 11.5–15.5)
WBC: 5.1 10*3/uL (ref 4.0–10.5)

## 2014-10-10 LAB — COMPREHENSIVE METABOLIC PANEL
ALK PHOS: 73 U/L (ref 39–117)
ALT: 15 U/L (ref 0–53)
AST: 36 U/L (ref 0–37)
Albumin: 2.3 g/dL — ABNORMAL LOW (ref 3.5–5.2)
Anion gap: 9 (ref 5–15)
BUN: 30 mg/dL — ABNORMAL HIGH (ref 6–23)
CO2: 29 mmol/L (ref 19–32)
Calcium: 7.6 mg/dL — ABNORMAL LOW (ref 8.4–10.5)
Chloride: 98 mmol/L (ref 96–112)
Creatinine, Ser: 9.51 mg/dL — ABNORMAL HIGH (ref 0.50–1.35)
GFR calc Af Amer: 7 mL/min — ABNORMAL LOW (ref >90)
GFR calc non Af Amer: 6 mL/min — ABNORMAL LOW (ref >90)
Glucose, Bld: 131 mg/dL — ABNORMAL HIGH (ref 70–99)
POTASSIUM: 3.6 mmol/L (ref 3.5–5.1)
SODIUM: 136 mmol/L (ref 135–145)
TOTAL PROTEIN: 6.1 g/dL (ref 6.0–8.3)
Total Bilirubin: 0.7 mg/dL (ref 0.3–1.2)

## 2014-10-10 LAB — PROTIME-INR
INR: 3.38 — ABNORMAL HIGH (ref 0.00–1.49)
PROTHROMBIN TIME: 34.5 s — AB (ref 11.6–15.2)

## 2014-10-10 LAB — PHOSPHORUS: Phosphorus: 2.7 mg/dL (ref 2.3–4.6)

## 2014-10-10 MED ORDER — LISINOPRIL 10 MG PO TABS: 10.0000 mg | ORAL_TABLET | Freq: Every day | ORAL | Status: DC

## 2014-10-10 MED ORDER — MINOXIDIL 2.5 MG PO TABS: 2.5000 mg | ORAL_TABLET | Freq: Two times a day (BID) | ORAL | Status: DC

## 2014-10-10 MED ORDER — LIDOCAINE HCL (PF) 1 % IJ SOLN: 5.0000 mL | INTRAMUSCULAR | Status: DC | PRN

## 2014-10-10 MED ORDER — DOXERCALCIFEROL 4 MCG/2ML IV SOLN: 2.5000 ug | INTRAVENOUS | Status: DC

## 2014-10-10 MED ORDER — SODIUM CHLORIDE 0.9 % IV SOLN: 100.0000 mL | INTRAVENOUS | Status: DC | PRN

## 2014-10-10 MED ORDER — SULFAMETHOXAZOLE-TRIMETHOPRIM 400-80 MG PO TABS: 1.0000 | ORAL_TABLET | ORAL | Status: DC

## 2014-10-10 MED ORDER — LIDOCAINE-PRILOCAINE 2.5-2.5 % EX CREA: 1.0000 "application " | TOPICAL_CREAM | CUTANEOUS | Status: DC | PRN

## 2014-10-10 MED ORDER — HEPARIN SODIUM (PORCINE) 1000 UNIT/ML DIALYSIS: 1000.0000 [IU] | INTRAMUSCULAR | Status: DC | PRN

## 2014-10-10 MED ORDER — ALTEPLASE 2 MG IJ SOLR
2.0000 mg | Freq: Once | INTRAMUSCULAR | Status: DC | PRN
  Filled 2014-10-10: qty 2

## 2014-10-10 MED ORDER — HEPARIN SODIUM (PORCINE) 1000 UNIT/ML DIALYSIS
100.0000 [IU]/kg | INTRAMUSCULAR | Status: DC | PRN
  Filled 2014-10-10: qty 8

## 2014-10-10 MED ORDER — CARVEDILOL 25 MG PO TABS: 25.0000 mg | ORAL_TABLET | Freq: Two times a day (BID) | ORAL | Status: DC

## 2014-10-10 MED ORDER — NEPRO/CARBSTEADY PO LIQD: 237.0000 mL | ORAL | Status: DC | PRN

## 2014-10-10 MED ORDER — WARFARIN SODIUM 2 MG PO TABS
2.0000 mg | ORAL_TABLET | Freq: Every day | ORAL | Status: DC
Start: 2014-10-10 — End: 2014-11-19

## 2014-10-10 MED ORDER — VANCOMYCIN HCL IN DEXTROSE 750-5 MG/150ML-% IV SOLN
750.0000 mg | Freq: Once | INTRAVENOUS | Status: AC
  Administered 2014-10-10: 750 mg via INTRAVENOUS
  Filled 2014-10-10: qty 150

## 2014-10-10 MED ORDER — PENTAFLUOROPROP-TETRAFLUOROETH EX AERO: 1.0000 "application " | INHALATION_SPRAY | CUTANEOUS | Status: DC | PRN

## 2014-10-10 MED ORDER — DEXTROSE 5 % IV SOLN
2.0000 g | Freq: Once | INTRAVENOUS | Status: AC
  Administered 2014-10-10: 2 g via INTRAVENOUS
  Filled 2014-10-10: qty 2

## 2014-10-10 MED ORDER — OXYCODONE HCL 5 MG PO TABS: 5.0000 mg | ORAL_TABLET | ORAL | Status: DC | PRN

## 2014-10-10 NOTE — Discharge Summary (Addendum)
Physician Discharge Summary  Frank [name]:[id number] DOB: March 18, 1966 DOA: 10/07/2014  PCP: [name], NP  Admit date: 10/07/2014 Discharge date: 10/10/2014  Time spent: 35 minutes  Recommendations for Outpatient Follow-up:  1. Check INR on Wednesday 2. Monitor BP at home and bring to PCP 3. Slow prednisone taper 4. D/c tacrolimus  Discharge Diagnoses:  Principal Problem:   HCAP (healthcare-associated pneumonia) Active Problems:   Anemia of chronic kidney failure   CHF (congestive heart failure)   Generalized anxiety disorder   Chronic anticoagulation   Prolonged Q-T interval on ECG   Hypertensive urgency   End-stage renal disease on hemodialysis   Immunosuppression   Pneumonitis   Chest pain   SOB (shortness of breath)   ESRD on hemodialysis   Discharge Condition: improved  Diet recommendation: cardiac/renal  Filed Weights   10/10/14 0738 10/10/14 1152  Weight: 75.5 kg (166 lb 7.2 oz) 74.5 kg (164 lb 3.9 oz)    History of present illness:  49 year old man with end-stage renal disease likely secondary to refractory hypertension. He has a failed renal transplant. He continues to take immunosuppressive therapy against the advice of his nephrologist. He has both a vascular catheter and a peritoneal dialysis catheter. He has been poorly compliant with his dialysis treatments. He has had a number of visits to the emergency department over the last year for atypical left parasternal chest pain which sometimes simulates ischemic cardiac pain. He does have known cardiac disease with previous MI. He had an extensive cardiology evaluation during a recent admission in February of this year. Myoview study done on February 11 showed a fixed perfusion defect in the inferior wall consistent with a previous inferior wall infarction. Additional small moderate reversible defect seen along the anterior wall. There was global hypokinesis, dilated left ventricle, decreased ejection fraction  estimated at 35%. There was good correlation with a echocardiogram done at the same time estimating ejection fraction 40-45 percent and also showing diffuse hypokinesis. A left heart cath was done on February 12 with findings of minimal coronary artery disease with 30-40 percent stenosis and a distal portion of a main obtuse marginal branch of the left circumflex artery otherwise normal. He just had a brief admission here on March 11-13 when he developed heart failure because he skipped his dialysis treatments. Repeat echocardiogram at time of acute cardiac decompensation now showing estimated ejection fraction 30-35 percent. Quality of his pain at this time does not suggest ischemic cardiac. Pain is sharp, left parasternal, pleuritic component, he has had a productive cough for the last 3 days and low-grade fevers. He was seen for the same complaints on March 17 and discharged from the [facility] emergency department. Symptoms persisted and he presented to the [facility] emergency department yesterday March 18. Regular chest x-ray shows no infiltrate or effusion. A CT angiogram was done to rule out pulmonary embolus and is negative for pulmonary emboli. There is a very unimpressive vague patchy area of abnormality in the right upper lobe "which may represent infiltrate or aspiration". Incidentally noted mild aneurysmal enlargement of the proximal descending thoracic aorta. Due to the CT findings he was admitted and initially started on treatment for a hospital-acquired pneumonia.  Hospital Course:  HCAP:  -IV abx- change to PO bactrim for d/c  Left-sided chest pain: Patient has chronic troponin elevation 2/2 ESRD, trended down to 0.05 from 0.09 on admission. Left heart catheterization 1 month ago revealed mild CAD. Chest CTA revealed mild aneurysmal enlargement in the  proximal descending thoracic aorta. Unlikely that this is accounting for his chest pain. Recommendation by the radiology to  follow-up with CTA or MRA annually. cervical spine x-ray neg for cervical radiculopathy. Chest pain has improved, mild discomfort on deep inspiration. - suspect related to his uncontrolled BP  Hypertensive urgency: Blood pressure severely elevate w/ SBP in the 200's, BP still elevated ranging from 161-191 overnight. Patient on multiple outpatient medications for blood pressure including amlodipine 10 mg daily, Coreg 25 mg TID clonidine patch 0.3 mg every 24 hours, hydralazine 100 mg 3 times a day, nifedipine 30 mg daily, and minoxidil 2.5 mg daily. - continue all home BP meds, however changed coreg 84m TID to BID.   ESRD on HD TTS: Status post kidney transplant 2006 >>failed. Last dialysis on Tuesday. Has PD catheter in place and right IJ HD catheter. Both without evidence of infection. No acute electrolyte abnormalities on BMP.  -nephrology following for hemodialysis -Continue with prednisone 144m- stopped tacrolimus as has failed kidney transplant and now on HD.  -resume bactrim  Chronic Systolic CHF: No evidence of acute volume overload to suggest exacerbation: Echocardiogram on 10/02/2014 with EF of 30-35%. Severe LAE. Cath in February 2013 revealed mild CAD, but he does have diffuse hypokinesis. Likely nonischemic cardiomyopathy. Chest x-ray revealed mild congestive heart failure. -Continue with Coreg as above.  Anxiety disorder: Cont home Xanax 0.25 mg daily prn  Anemia of chronic renal disease: Hemoglobin  Stable. No acute bleeding. Will monitor  History of pulmonary embolism: CTA 05/08/2014 revealed acute sub-segmental pulmonary embolism on the left upper lobe. Patient on Coumadin. CTA neg this admission.  -Continue with Coumadin- patient has not been very compliant   Procedures:    Consultations:  renal  Discharge Exam: Filed Vitals:   10/10/14 1152  BP: 154/107  Pulse: 76  Temp: 98 F (36.7 C)  Resp: 17    General: A+Ox3, NAD Cardiovascular: rrr Respiratory:  clear  Discharge Instructions   Discharge Instructions    Discharge instructions    Complete by:  As directed   Do not take coumadin tonight, start Tuesday night Have INR checked on Wednesday Continue HD Monitor BP at home and bring to PCP Renal diet     Increase activity slowly    Complete by:  As directed           Current Discharge Medication List    START taking these medications   Details  doxercalciferol (HECTOROL) 4 MCG/2ML injection Inject 1.25 mLs (2.5 mcg total) into the vein Every Tuesday,Thursday,and Saturday with dialysis. Qty: 2 mL    lisinopril (PRINIVIL,ZESTRIL) 10 MG tablet Take 1 tablet (10 mg total) by mouth daily. Qty: 30 tablet, Refills: 0      CONTINUE these medications which have CHANGED   Details  carvedilol (COREG) 25 MG tablet Take 1 tablet (25 mg total) by mouth 2 (two) times daily with a meal.    minoxidil (LONITEN) 2.5 MG tablet Take 1 tablet (2.5 mg total) by mouth 2 (two) times daily. Qty: 60 tablet, Refills: 0    oxyCODONE (ROXICODONE) 5 MG immediate release tablet Take 1 tablet (5 mg total) by mouth every 4 (four) hours as needed for severe pain. Qty: 10 tablet, Refills: 0    sulfamethoxazole-trimethoprim (BACTRIM,SEPTRA) 400-80 MG per tablet Take 1 tablet by mouth every Monday, Wednesday, and Friday. Qty: 12 tablet, Refills: 0    warfarin (COUMADIN) 2 MG tablet Take 1 tablet (2 mg total) by mouth daily. Qty: 15 tablet,  Refills: 0      CONTINUE these medications which have NOT CHANGED   Details  ALPRAZolam (XANAX) 0.25 MG tablet Take 0.25 mg by mouth daily as needed. For sleep    amLODipine (NORVASC) 5 MG tablet Take 10 mg by mouth daily.    atorvastatin (LIPITOR) 40 MG tablet Take 1 tablet (40 mg total) by mouth daily at 6 PM. Qty: 30 tablet, Refills: 1    cinacalcet (SENSIPAR) 30 MG tablet Take 30 mg by mouth daily.    cloNIDine (CATAPRES - DOSED IN MG/24 HR) 0.3 mg/24hr patch Place 1 patch onto the skin. Every 7 days     Ferric Citrate 210 MG TABS Take 210 mg by mouth 3 (three) times daily with meals. Take 1 with each meal and 1 with a snack.    gentamicin cream (GARAMYCIN) 0.1 % Apply to [place] site    hydrALAZINE (APRESOLINE) 25 MG tablet Take 100 mg by mouth 3 (three) times daily.     hydrocortisone 1 % lotion Apply to site with itch as needed twice daily    levalbuterol (XOPENEX HFA) 45 MCG/ACT inhaler Inhale 2 puffs into the lungs every 4 (four) hours as needed for wheezing or shortness of breath. Qty: 1 Inhaler, Refills: 0    methocarbamol (ROBAXIN-750) 750 MG tablet Take 1 tablet (750 mg total) by mouth every 6 (six) hours as needed for muscle spasms. Qty: 40 tablet, Refills: 0    omeprazole (PRILOSEC) 20 MG capsule Take 20 mg by mouth daily.    polyethylene glycol (MIRALAX) packet Take 17 g by mouth daily. For constipation Qty: 14 each, Refills: 0    predniSONE (DELTASONE) 10 MG tablet Take 10 mg by mouth daily with breakfast.    sevelamer carbonate (RENVELA) 800 MG tablet Take 1,600 mg by mouth 3 (three) times daily with meals.      STOP taking these medications     nabumetone (RELAFEN) 500 MG tablet      NIFEdipine (PROCARDIA-XL/ADALAT CC) 30 MG 24 hr tablet      tacrolimus (PROGRAF) 1 MG capsule      traMADol (ULTRAM) 50 MG tablet        Allergies  Allergen Reactions  . Ferrlecit [Na Ferric Gluc Cplx In Sucrose] Shortness Of Breath, Swelling and Other (See Comments)    Swelling in throat  . Darvocet [Propoxyphene N-Acetaminophen] Hives   Follow-up Information    Follow up with [name], NP In 1 week.   Specialty:  Internal Medicine   Why:  INR check on Wednesday   Contact information:   [address] [place] [address] [phone]        The results of significant diagnostics from this hospitalization (including imaging, microbiology, ancillary and laboratory) are listed below for reference.    Significant Diagnostic Studies: Dg Chest 2  View  10/07/2014   CLINICAL DATA:  Chest pain and dyspnea for 2 days.  EXAM: CHEST  2 VIEW  COMPARISON:  None.  FINDINGS: There is a right jugular central line extending into the right atrium. There is moderate unchanged cardiomegaly. There is mild vascular and interstitial prominence which likely represents a degree of congestive heart failure. There are no effusions. There is no airspace consolidation.  IMPRESSION: Mild congestive heart failure.   Electronically Signed   By: Andreas Newport M.D.   On: 10/07/2014 02:58   Dg Chest 2 View  09/30/2014   CLINICAL DATA:  Vomiting, nausea, cough and sore throat for 1 week,  history hypertension, diabetes, end-stage renal disease, anxiety, former smoker  EXAM: CHEST  2 VIEW  COMPARISON:  09/28/2014  FINDINGS: RIGHT jugular dual-lumen dialysis catheter with tip projecting over cavoatrial junction.  Enlargement of cardiac silhouette with pulmonary vascular congestion.  Mediastinal contours normal.  Minimal peribronchial thickening.  Slightly improved perihilar edema.  No segmental consolidation, pleural effusion, or pneumothorax.  IMPRESSION: Enlargement of cardiac silhouette with pulmonary vascular congestion.  Minimal perihilar edema, improved   Electronically Signed   By: Lavonia Dana M.D.   On: 09/30/2014 16:20   Dg Chest 2 View  09/28/2014   CLINICAL DATA:  Nausea, vomiting, chest pain, shortness of breath. Dialysis patient.  EXAM: CHEST  2 VIEW  COMPARISON:  Chest radiograph May 20, 2015  FINDINGS: The cardiac silhouette is moderately enlarged, similar. Increasing peribronchial cuff and central pulmonary vascular congestion without pleural effusion or focal consolidation. Mediastinal silhouette is unremarkable. No pneumothorax.  Tunneled dialysis catheter via RIGHT internal jugular venous approach with distal tip projecting in proximal atrium, unchanged. Soft tissue planes and included osseous structures are nonsuspicious.  IMPRESSION: Stable cardiomegaly,  mildly worsening fluid overload.   Electronically Signed   By: Elon Alas   On: 09/28/2014 04:04   Dg Chest 2 View  09/19/2014   CLINICAL DATA:  Chest pain  EXAM: CHEST  2 VIEW  COMPARISON:  09/12/2014  FINDINGS: Dialysis catheter from right IJ approach is stable, tip in the upper right atrium. Stable mild cardiomegaly and aortic tortuosity. There is no edema, consolidation, effusion, or pneumothorax.  IMPRESSION: No active cardiopulmonary disease.   Electronically Signed   By: Monte Fantasia M.D.   On: 09/19/2014 01:39   Dg Chest 2 View  09/12/2014   CLINICAL DATA:  Chest pain.  EXAM: CHEST  2 VIEW  COMPARISON:  Radiographs and CT 08/30/2014  FINDINGS: Right-sided dialysis catheter remains in place. Cardiomegaly is stable. No pulmonary edema. There is mild peribronchial cuffing. No consolidation to suggest pneumonia. No pleural effusion or pneumothorax. No acute osseous abnormalities.  IMPRESSION: Mild peribronchial cuffing, may reflect bronchitis. Stable cardiomegaly.   Electronically Signed   By: Jeb Levering M.D.   On: 09/12/2014 03:58   Dg Cervical Spine Complete  10/07/2014   CLINICAL DATA:  Left upper extremity pain/radicular type symptoms  EXAM: CERVICAL SPINE  4+ VIEWS  COMPARISON:  None.  FINDINGS: Frontal, lateral, open-mouth odontoid, and bilateral oblique views were obtained. There is no fracture or spondylolisthesis. Prevertebral soft tissues and predental space regions are normal. There is moderate narrowing at C5-6. There is slight disc space narrowing at C6-7. There is no appreciable exit foraminal narrowing on the oblique views. There is calcification in the anterior ligament at C4-5 and C5-6. There is a focal area of calcification in the right carotid artery.  IMPRESSION: Areas of osteoarthritic change. No fracture or spondylolisthesis. Focal carotid artery calcification on the right.   Electronically Signed   By: Lowella Grip III M.D.   On: 10/07/2014 11:15   Ct Angio  Chest Pe W/cm &/or Wo Cm  10/07/2014   CLINICAL DATA:  Dyspnea for 4 days with pain radiating into left upper extremity.  EXAM: CT ANGIOGRAPHY CHEST WITH CONTRAST  TECHNIQUE: Multidetector CT imaging of the chest was performed using the standard protocol during bolus administration of intravenous contrast. Multiplanar CT image reconstructions and MIPs were obtained to evaluate the vascular anatomy.  CONTRAST:  77m OMNIPAQUE IOHEXOL 350 MG/ML SOLN  COMPARISON:  08/30/2014  FINDINGS: The pulmonary vasculature is  well opacified with no evidence of pulmonary embolism. There is mild aneurysmal enlargement of the proximal descending aorta, measuring 3.7 cm. The ascending aorta is normal in caliber. The mid the distal portions of the descending aorta are normal in caliber. This is unchanged from 08/30/2014.  There are no effusions. There is no mediastinal or hilar adenopathy. Esophagus appears unremarkable.  There is airspace opacity in the right upper lobe which may represent infiltrate or aspiration. The left lung is clear.  Upper abdomen is remarkable for atrophic kidneys and a large liver.  Review of the MIP images confirms the above findings.  IMPRESSION: 1. Negative for pulmonary embolism 2. Mild aneurysmal enlargement of the proximal descending thoracic aorta. Recommend annual imaging followup by CTA or MRA. This recommendation follows 2010 ACCF/AHA/AATS/ACR/ASA/SCA/SCAI/SIR/STS/SVM Guidelines for the Diagnosis and Management of Patients with Thoracic Aortic Disease. Circulation.2010; 121: I627-O350 3. Right upper lobe infiltrate, possibly infectious.   Electronically Signed   By: Andreas Newport M.D.   On: 10/07/2014 06:22    Microbiology: Recent Results (from the past 240 hour(s))  Culture, blood (routine x 2)     Status: None (Preliminary result)   Collection Time: 10/07/14  7:20 AM  Result Value Ref Range Status   Specimen Description BLOOD RIGHT HAND  Final   Special Requests BOTTLES DRAWN AEROBIC  AND ANAEROBIC 5CCS  Final   Culture   Final           BLOOD CULTURE RECEIVED NO GROWTH TO DATE CULTURE WILL BE HELD FOR 5 DAYS BEFORE ISSUING A FINAL NEGATIVE REPORT Performed at [facility]    Report Status PENDING  Incomplete  Culture, blood (routine x 2)     Status: None (Preliminary result)   Collection Time: 10/07/14  7:25 AM  Result Value Ref Range Status   Specimen Description BLOOD LEFT HAND  Final   Special Requests BOTTLES DRAWN AEROBIC AND ANAEROBIC 5CCS  Final   Culture   Final           BLOOD CULTURE RECEIVED NO GROWTH TO DATE CULTURE WILL BE HELD FOR 5 DAYS BEFORE ISSUING A FINAL NEGATIVE REPORT Performed at [facility]    Report Status PENDING  Incomplete  MRSA PCR Screening     Status: None   Collection Time: 10/07/14  6:50 PM  Result Value Ref Range Status   MRSA by PCR NEGATIVE NEGATIVE Final    Comment:        The GeneXpert MRSA Assay (FDA approved for NASAL specimens only), is one component of a comprehensive MRSA colonization surveillance program. It is not intended to diagnose MRSA infection nor to guide or monitor treatment for MRSA infections.      Labs: Basic Metabolic Panel:  Recent Labs Lab 10/06/14 0532 10/07/14 0220 10/08/14 0824 10/08/14 1947 10/10/14 0500  NA 137 134* 135 132* 136  K 4.7 4.8 3.9 4.2 3.6  CL 100 99 99 96 98  CO2 _0 GLUCOSE 89 88 123* 131* 131*  BUN 49* 53* 26* 14 30*  CREATININE 13.06* 14.42* 9.12* 5.59* 9.51*  CALCIUM 8.0* 7.8* 7.5* 7.4* 7.6*  PHOS  --   --   --   --  2.7   Liver Function Tests:  Recent Labs Lab 10/10/14 0500  AST 36  ALT 15  ALKPHOS 73  BILITOT 0.7  PROT 6.1  ALBUMIN 2.3*   No results for input(s): LIPASE, AMYLASE in the last 168 hours. No results for input(s):  AMMONIA in the last 168 hours. CBC:  Recent Labs Lab 10/06/14 0532 10/07/14 0220 10/08/14 0815 10/08/14 1947 10/10/14 0500  WBC 6.0 6.8 3.5* 4.5 5.1  NEUTROABS 3.4 4.6 2.2 3.6  --   HGB  8.3* 7.9* 7.4* 8.0* 7.6*  HCT 26.3* 24.9* 22.9* 25.2* 23.8*  MCV 80.7 80.1 78.2 78.8 80.4  PLT 165 173 168 188 209   Cardiac Enzymes:  Recent Labs Lab 10/06/14 0532 10/06/14 0813 10/07/14 1830 10/07/14 2155  TROPONINI 0.09* 0.08* 0.05* 0.05*   BNP: BNP (last 3 results)  Recent Labs  08/30/14 0103 09/12/14 0251 09/30/14 1556  BNP 1924.6* >4500.0* >4500.0*    ProBNP (last 3 results)  Recent Labs  01/02/14 0317 01/18/14 0048 04/15/14 0809  PROBNP >70000.0* >70000.0* >70000.0*    CBG:  Recent Labs Lab 10/07/14 1635  GLUCAP 106*       Signed:  Saturnino Liew  [facility] 10/10/2014, 2:15 PM

## 2014-10-10 NOTE — Progress Notes (Signed)
Patient Discharge:  Disposition: Pt discharged home to self  Education: Pt educated on medications, follow up appointments, and all discharge instructions. Pt verbalized understanding.   IV: removed  Telemetry: Removed CCMD notified  Follow-up appointments: Set up appointments for pt and reviewed with pt  Prescriptions: Scripts given to pt  Transportation: Pt transported home by family  Belongings: All belongings taken with pt

## 2014-10-10 NOTE — Progress Notes (Deleted)
Cardiology Office Note   Date:  10/11/2014   ID:  Frank [name], DOB October 09, 1965, MRN [id number]  PCP:  [name], NP  Cardiologist:  Dr. Candee Furbish     Chief Complaint  Patient presents with  . Congestive Heart Failure     History of Present Illness: Frank [name] is a 49 y.o. male with a hx of ESRD s/p failed renal Tx, prior PE dx in 04/2014 on Coumadin, HTN, DM2.  Of note, he continues to take immunosuppressive therapy against the advice of his nephrologist.  He has had poor adherence to dialysis.   Admitted in 08/2014 with chest pain and elevated troponin.  Chest CT was neg for PE.  CXR demonstrated probable edema.  Echo demonstrated EF 40-45%.  Myoview was abnormal.  He as noted to anemic with Hgb 7.  LHC demonstrated no significant CAD and he was dx with NICM.  He was transfused with 2 units PRBCs.     Admitted 3/11-3/13 with a/c combined systolic and diastolic CHF.  He skipped some dialysis sessions.  Volume management was accomplished with dialysis.  Repeat echo demonstrated worsening LVF with EF 30-35%.    Readmitted 3/18-3/21 with probable HCAP.  Repeat CT was neg for PE.  He had uncontrolled BPs upon presentation.  CT did demonstrate a mild aneurysmal dilation of the proximal descending aorta.    He returns for FU.  ***  Studies/Reports Reviewed Today:  Chest CTA 10/07/14 IMPRESSION: 1. Negative for pulmonary embolism 2. Mild aneurysmal enlargement of the proximal descending thoracic aorta. Recommend annual imaging followup by CTA or MRA. This recommendation follows 2010 ACCF/AHA/AATS/ACR/ASA/SCA/SCAI/SIR/STS/SVM Guidelines for the Diagnosis and Management of Patients with Thoracic Aortic Disease. Circulation.2010; 121: C003-K917 3. Right upper lobe infiltrate, possibly infectious.  Echo 10/02/14 - Moderate LVH. EF 30-35%. Diffuse hypokinesis. Doppler parameters are consistent with restrictive physiology, indicative of decreased left ventricular diastolic  compliance and/or increased left atrial pressure. - Mitral valve: There was mild regurgitation. - Left atrium: The atrium was severely dilated. - Right ventricle: The cavity size was moderately dilated. - Right atrium: The atrium was mildly dilated. Central venous pressure (est): 15 mm Hg. - Atrial septum: No defect or patent foramen ovale was identified. - Tricuspid valve: There was mild regurgitation. - PA peak pressure: 40 mm Hg (S). - Pericardium, extracardiac: A small pericardial effusion was identified circumferential to the heart with moderate collection at anterior base around right atrium.  Impressions:  Moderate LVH with mild LV chamber dilatation and LVEF 30-35%, decreased compared to prior study in February 2016. Restrictive diastolic filling pattern. Severe left atrial enlargement. Mild mitral regurgitation. Moderate RV dilatation. Mild tricuspid regurgitation with PASP 40 mmHg. A small pericardial effusion was identified circumferential to the heart with moderate collection at anterior base around right atrium.   Myoview 09/01/14 IMPRESSION: 1. Prior inferior wall infarct with peri-infarct ischemia and moderate reversible defect along the lateral wall. Additional small to moderate reversible defect along the anterior wall. 2. Global hypokinesis with associated left ventricular dilatation. 3. Left ventricular ejection fraction 35% 4. High-risk stress test findings*.    Cardiac cath 09/02/14 LM:  Angiographic normal. LA:  Lum irreg LCx:  Dist main OM 30-40% RCA:  lum irreg     Past Medical History  Diagnosis Date  . Hypertension   . Depression   . Complication of anesthesia     itching, sore throat  . Diabetes mellitus without complication     No history per patient, but  remains under history as A1c would not be accurate given on dialysis  . Shortness of breath   . Anxiety   . ESRD (end stage renal disease)     due to HTN per patient, followed at [facility], s/p failed  kidney transplant - dialysis Tue, Th, Sat  . Renal insufficiency   . CHF (congestive heart failure)   . Anemia     Past Surgical History  Procedure Laterality Date  . Kidney receipient  2006    failed and started HD in March 2014  . Capd insertion    . Capd removal    . Left heart catheterization with coronary angiogram N/A 09/02/2014    Procedure: LEFT HEART CATHETERIZATION WITH CORONARY ANGIOGRAM;  Surgeon: [name], MD;  Location: [facility] CATH LAB;  Service: Cardiovascular;  Laterality: N/A;     Current Outpatient Prescriptions  Medication Sig Dispense Refill  . ALPRAZolam (XANAX) 0.25 MG tablet Take 0.25 mg by mouth daily as needed. For sleep    . amLODipine (NORVASC) 5 MG tablet Take 10 mg by mouth daily.    [name] atorvastatin (LIPITOR) 40 MG tablet Take 1 tablet (40 mg total) by mouth daily at 6 PM. (Patient not taking: Reported on 09/19/2014) 30 tablet 1  . carvedilol (COREG) 25 MG tablet Take 1 tablet (25 mg total) by mouth 2 (two) times daily with a meal.    . cinacalcet (SENSIPAR) 30 MG tablet Take 30 mg by mouth daily.    . cloNIDine (CATAPRES - DOSED IN MG/24 HR) 0.3 mg/24hr patch Place 1 patch onto the skin. Every 7 days    . doxercalciferol (HECTOROL) 4 MCG/2ML injection Inject 1.25 mLs (2.5 mcg total) into the vein Every Tuesday,Thursday,and Saturday with dialysis. 2 mL   . Ferric Citrate 210 MG TABS Take 210 mg by mouth 3 (three) times daily with meals. Take 1 with each meal and 1 with a snack.    [name] gentamicin cream (GARAMYCIN) 0.1 % Apply to [place] site    . hydrALAZINE (APRESOLINE) 25 MG tablet Take 100 mg by mouth 3 (three) times daily.     . hydrocortisone 1 % lotion Apply to site with itch as needed twice daily    . levalbuterol (XOPENEX HFA) 45 MCG/ACT inhaler Inhale 2 puffs into the lungs every 4 (four) hours as needed for wheezing or shortness of breath. 1 Inhaler 0  . lisinopril (PRINIVIL,ZESTRIL) 10 MG tablet Take 1 tablet (10 mg total) by mouth daily. 30  tablet 0  . methocarbamol (ROBAXIN-750) 750 MG tablet Take 1 tablet (750 mg total) by mouth every 6 (six) hours as needed for muscle spasms. 40 tablet 0  . minoxidil (LONITEN) 2.5 MG tablet Take 1 tablet (2.5 mg total) by mouth 2 (two) times daily. 60 tablet 0  . omeprazole (PRILOSEC) 20 MG capsule Take 20 mg by mouth daily.    [name] oxyCODONE (ROXICODONE) 5 MG immediate release tablet Take 1 tablet (5 mg total) by mouth every 4 (four) hours as needed for severe pain. 10 tablet 0  . polyethylene glycol (MIRALAX) packet Take 17 g by mouth daily. For constipation (Patient taking differently: Take 17 g by mouth daily as needed for mild constipation. For constipation) 14 each 0  . predniSONE (DELTASONE) 10 MG tablet Take 10 mg by mouth daily with breakfast.    . sevelamer carbonate (RENVELA) 800 MG tablet Take 1,600 mg by mouth 3 (three) times daily with meals.    [name] sulfamethoxazole-trimethoprim (BACTRIM,SEPTRA) 400-80 MG  per tablet Take 1 tablet by mouth every Monday, Wednesday, and Friday. 12 tablet 0  . warfarin (COUMADIN) 2 MG tablet Take 1 tablet (2 mg total) by mouth daily. 15 tablet 0   No current facility-administered medications for this visit.    Allergies:   Ferrlecit and Darvocet    Social History:  The patient  reports that he has quit smoking. His smoking use included Cigarettes. He has a 1 pack-year smoking history. He has never used smokeless tobacco. He reports that he does not drink alcohol or use illicit drugs.   Family History:  The patient's ***family history is not on file.    ROS:   Please see the history of present illness.   ROS    PHYSICAL EXAM: VS:  There were no vitals taken for this visit.    Wt Readings from Last 3 Encounters:  10/10/14 164 lb 3.9 oz (74.5 kg)  10/01/14 143 lb 4.8 oz (65 kg)  09/28/14 170 lb (77.111 kg)     GEN: Well nourished, well developed, in no acute distress HEENT: normal Neck: *** JVD, ***carotid bruits, no masses Cardiac:  Normal  S1/S2, ***RRR; *** murmur ***, *** no rubs or gallops, {NUMBERS; 1+ TO 4+, TRACE/RARE:14493} edema  Respiratory:  ***clear to auscultation bilaterally, no wheezing, rhonchi or rales. GI: ***soft, nontender, nondistended, + BS MS: no deformity or atrophy Skin: warm and dry  Neuro:  CNs II-XII intact, Strength and sensation are intact Psych: Normal affect   EKG:  EKG {ACTION; IS/IS AJG:[id number]} ordered today.  It demonstrates:   ***   Recent Labs: 12/12/2013: TSH 1.470 04/15/2014: Pro B Natriuretic peptide (BNP) >70000.0* 09/30/2014: B Natriuretic Peptide >4500.0* 10/10/2014: ALT 15; BUN 30*; Creatinine 9.51*; Hemoglobin 7.6*; Platelets 209; Potassium 3.6; Sodium 136    Lipid Panel No results found for: CHOL, TRIG, HDL, CHOLHDL, VLDL, LDLCALC, LDLDIRECT    ASSESSMENT AND PLAN:  NICM (nonischemic cardiomyopathy)  Chronic combined systolic and diastolic CHF (congestive heart failure)  Pulmonary embolus  Pericardial effusion  Malignant essential hypertension  ESRD on hemodialysis  Anemia of chronic kidney failure, stage 5  Thoracic aortic aneurysm ***   Current medicines are reviewed at length with the patient today.  The patient {ACTIONS; HAS/DOES NOT HAVE:19233} concerns regarding medicines.  The following changes have been made:  {PLAN; NO CHANGE:13088:s}  Labs/ tests ordered today include: *** No orders of the defined types were placed in this encounter.     Disposition:   FU with ***   Signed, Richardson Dopp, PA-C, MHS 10/11/2014 8:12 AM    [facility] [place], [place],   [address] Phone: [phone]; Fax: [phone]

## 2014-10-10 NOTE — Procedures (Signed)
I have seen and examined this patient and agree with the plan of care Patient is seen on dialysis with no issues today  Frank [name] W 10/10/2014, 11:49 AM

## 2014-10-10 NOTE — Progress Notes (Signed)
ANTICOAGULATION and ANTIBIOTIC CONSULT NOTE - Follow up [place] for warfarin Indication: VTE treatment  Allergies  Allergen Reactions  . Ferrlecit [Na Ferric Gluc Cplx In Sucrose] Shortness Of Breath, Swelling and Other (See Comments)    Swelling in throat  . Darvocet [Propoxyphene N-Acetaminophen] Hives    Patient Measurements: Weight: 166 lb 7.2 oz (75.5 kg) (stood to scale )   Vital Signs: Temp: 98.6 F (37 C) (03/21 0738) Temp Source: Oral (03/21 0738) BP: 157/101 mmHg (03/21 1130) Pulse Rate: 74 (03/21 1130)  Labs:  Recent Labs  10/07/14 1830 10/07/14 2155  10/08/14 0815  10/08/14 0824 10/08/14 1947 10/09/14 0822 10/10/14 0500  HGB  --   --   < > 7.4*  --   --  8.0*  --  7.6*  HCT  --   --   --  22.9*  --   --  25.2*  --  23.8*  PLT  --   --   --  168  --   --  188  --  209  LABPROT  --   --   --   --   < > 22.3* 22.5* 25.1* 34.5*  INR  --   --   --   --   < > 1.94* 1.96* 2.26* 3.38*  CREATININE  --   --   --   --   --  9.12* 5.59*  --  9.51*  TROPONINI 0.05* 0.05*  --   --   --   --   --   --   --   < > = values in this interval not displayed.  Estimated Creatinine Clearance: 9.9 mL/min (by C-G formula based on Cr of 9.51).   Assessment: Frank [name] with history of PE in 04/2014 on warfarin. INR low on admit at 1.54 on 3/17, history of noncompliance with appointments as noted in [facility] notes. Home warfarin dose = 23m daily- patient states compliance, but he hasn't received doses that far out of his home dose and INR is now elevated at 3.38. Question how compliant he is at home.  Also, he was on vancomycin and cefepime for suspected HCAP. After discussion with Dr. VEliseo Squires he received his last IV doses of antibiotics in HD today and is to transition to PO. He was supposed to be on Bactrium SS 1 tab on MWF, but per med hx pt states he hadn't started taking this yet. However, this seems unlikely as he had his transplant a few years ago.  Goal of  Therapy:  INR 2-3 Monitor platelets by anticoagulation protocol: Yes   Plan:  - hold warfarin tonight  - Daily PT/INR - Monitor for s/sx bleeding - restart Bactrim 1 tab PO on MWF starting 3/23 as he has received antibiotics today *this will affect INR, so will watch carefully   Frank [name], PharmD, BCPS Clinical Pharmacist Pager: [phone]/21/2016 12:02 PM

## 2014-10-10 NOTE — Progress Notes (Signed)
[name] ROUNDING NOTE   Subjective:   Interval History: breathing improved  Vital signs in last 24 hours:  Temp:  [97.7 F (36.5 C)-98.6 F (37 C)] 98.6 F (37 C) (03/21 0738) Pulse Rate:  [65-82] 74 (03/21 1130) Resp:  [16-20] 20 (03/21 0738) BP: (136-163)/(89-116) 157/101 mmHg (03/21 1130) SpO2:  [99 %] 99 % (03/21 0738) Weight:  [75.5 kg (166 lb 7.2 oz)] 75.5 kg (166 lb 7.2 oz) (03/21 0738)  Weight change:  Filed Weights   10/10/14 0738  Weight: 75.5 kg (166 lb 7.2 oz)    Intake/Output: I/O last 3 completed shifts: In: 1200 [P.O.:1200] Out: 0    Intake/Output this shift:     CVS- RRR RS- CTA  RIJ cath ABD- BS present soft non-distended  PD cath R mid abdm EXT- no edema   Basic Metabolic Panel:  Recent Labs Lab 10/06/14 0532 10/07/14 0220 10/08/14 0824 10/08/14 1947 10/10/14 0500  NA 137 134* 135 132* 136  K 4.7 4.8 3.9 4.2 3.6  CL 100 99 99 96 98  CO2 _0 GLUCOSE 89 88 123* 131* 131*  BUN 49* 53* 26* 14 30*  CREATININE 13.06* 14.42* 9.12* 5.59* 9.51*  CALCIUM 8.0* 7.8* 7.5* 7.4* 7.6*  PHOS  --   --   --   --  2.7    Liver Function Tests:  Recent Labs Lab 10/10/14 0500  AST 36  ALT 15  ALKPHOS 73  BILITOT 0.7  PROT 6.1  ALBUMIN 2.3*   No results for input(s): LIPASE, AMYLASE in the last 168 hours. No results for input(s): AMMONIA in the last 168 hours.  CBC:  Recent Labs Lab 10/06/14 0532 10/07/14 0220 10/08/14 0815 10/08/14 1947 10/10/14 0500  WBC 6.0 6.8 3.5* 4.5 5.1  NEUTROABS 3.4 4.6 2.2 3.6  --   HGB 8.3* 7.9* 7.4* 8.0* 7.6*  HCT 26.3* 24.9* 22.9* 25.2* 23.8*  MCV 80.7 80.1 78.2 78.8 80.4  PLT 165 173 168 188 209    Cardiac Enzymes:  Recent Labs Lab 10/06/14 0532 10/06/14 0813 10/07/14 1830 10/07/14 2155  TROPONINI 0.09* 0.08* 0.05* 0.05*    BNP: Invalid input(s): POCBNP  CBG:  Recent Labs Lab 10/07/14 1635  GLUCAP 106*    Microbiology: Results for orders placed or  performed during the hospital encounter of 10/07/14  Culture, blood (routine x 2)     Status: None (Preliminary result)   Collection Time: 10/07/14  7:20 AM  Result Value Ref Range Status   Specimen Description BLOOD RIGHT HAND  Final   Special Requests BOTTLES DRAWN AEROBIC AND ANAEROBIC 5CCS  Final   Culture   Final           BLOOD CULTURE RECEIVED NO GROWTH TO DATE CULTURE WILL BE HELD FOR 5 DAYS BEFORE ISSUING A FINAL NEGATIVE REPORT Performed at [facility]    Report Status PENDING  Incomplete  Culture, blood (routine x 2)     Status: None (Preliminary result)   Collection Time: 10/07/14  7:25 AM  Result Value Ref Range Status   Specimen Description BLOOD LEFT HAND  Final   Special Requests BOTTLES DRAWN AEROBIC AND ANAEROBIC 5CCS  Final   Culture   Final           BLOOD CULTURE RECEIVED NO GROWTH TO DATE CULTURE WILL BE HELD FOR 5 DAYS BEFORE ISSUING A FINAL NEGATIVE REPORT Performed at [facility]    Report Status PENDING  Incomplete  MRSA PCR Screening     Status: None   Collection Time: 10/07/14  6:50 PM  Result Value Ref Range Status   MRSA by PCR NEGATIVE NEGATIVE Final    Comment:        The GeneXpert MRSA Assay (FDA approved for NASAL specimens only), is one component of a comprehensive MRSA colonization surveillance program. It is not intended to diagnose MRSA infection nor to guide or monitor treatment for MRSA infections.     Coagulation Studies:  Recent Labs  10/08/14 0824 10/08/14 1947 10/09/14 0822 10/10/14 0500  LABPROT 22.3* 22.5* 25.1* 34.5*  INR 1.94* 1.96* 2.26* 3.38*    Urinalysis: No results for input(s): COLORURINE, LABSPEC, PHURINE, GLUCOSEU, HGBUR, BILIRUBINUR, KETONESUR, PROTEINUR, UROBILINOGEN, NITRITE, LEUKOCYTESUR in the last 72 hours.  Invalid input(s): APPERANCEUR    Imaging: No results found.   Medications:   . sodium chloride Stopped (10/07/14 0551)   . amLODipine  10 mg Oral QHS  . atorvastatin   40 mg Oral q1800  . carvedilol  25 mg Oral BID WC  . ceFEPime (MAXIPIME) IV  2 g Intravenous Once  . cinacalcet  30 mg Oral Q breakfast  . cloNIDine  0.3 mg Transdermal Weekly  . doxercalciferol  2.5 mcg Intravenous Q T,Th,Sa-HD  . hydrALAZINE  100 mg Oral TID  . lisinopril  10 mg Oral Daily  . minoxidil  2.5 mg Oral BID  . pantoprazole  40 mg Oral Daily  . predniSONE  10 mg Oral Q breakfast  . sevelamer carbonate  1,600 mg Oral TID WC  . vancomycin  750 mg Intravenous Once  . Warfarin - Pharmacist Dosing Inpatient   Does not apply q1800   sodium chloride, sodium chloride, ALPRAZolam, alteplase, feeding supplement (NEPRO CARB STEADY), heparin, heparin, ketorolac, lidocaine (PF), lidocaine-prilocaine, oxyCODONE-acetaminophen, pentafluoroprop-tetrafluoroeth  Assessment/ Plan:   Patient now receiving dialysis appears to be doing well no emerging issues on dialysis   Hopefully ready for discharge   LOS: 3 Frank [name] W _0 _1 :46 AM

## 2014-10-11 ENCOUNTER — Encounter: Payer: Medicare Other | Admitting: Physician Assistant

## 2014-10-11 LAB — DRUG SCREEN PANEL (SERUM)

## 2014-10-11 NOTE — Progress Notes (Signed)
  This encounter was created in error - please disregard.

## 2014-10-13 ENCOUNTER — Ambulatory Visit: Payer: Medicare Other | Attending: Internal Medicine

## 2014-10-13 DIAGNOSIS — I2699 Other pulmonary embolism without acute cor pulmonale: Secondary | ICD-10-CM

## 2014-10-13 LAB — CULTURE, BLOOD (ROUTINE X 2)
Culture: NO GROWTH
Culture: NO GROWTH

## 2014-10-13 LAB — POCT INR: INR: 2.2

## 2014-10-14 ENCOUNTER — Telehealth: Payer: Self-pay | Admitting: *Deleted

## 2014-10-14 NOTE — Telephone Encounter (Signed)
Pharmacy called to inquire if we prescribed levalbuterol (XOPENEX HFA) 45 MCG/ACT inhaler for pt.  NCM found medication on list of home meds and that pt previously had filled at [facility].  Pharmacy will call [place].

## 2014-10-17 ENCOUNTER — Encounter (HOSPITAL_COMMUNITY): Payer: Self-pay | Admitting: Emergency Medicine

## 2014-10-17 ENCOUNTER — Emergency Department (HOSPITAL_COMMUNITY)
Admission: EM | Admit: 2014-10-17 | Discharge: 2014-10-17 | Disposition: A | Discharge status: Home / Self Care | Payer: Medicare Other | Attending: Emergency Medicine | Admitting: Emergency Medicine

## 2014-10-17 ENCOUNTER — Inpatient Hospital Stay: Payer: Medicare Other | Admitting: Internal Medicine

## 2014-10-17 DIAGNOSIS — I120 Hypertensive chronic kidney disease with stage 5 chronic kidney disease or end stage renal disease: Secondary | ICD-10-CM | POA: Insufficient documentation

## 2014-10-17 DIAGNOSIS — Z87891 Personal history of nicotine dependence: Secondary | ICD-10-CM | POA: Diagnosis not present

## 2014-10-17 DIAGNOSIS — N186 End stage renal disease: Secondary | ICD-10-CM | POA: Diagnosis not present

## 2014-10-17 DIAGNOSIS — M546 Pain in thoracic spine: Secondary | ICD-10-CM | POA: Insufficient documentation

## 2014-10-17 DIAGNOSIS — Z7901 Long term (current) use of anticoagulants: Secondary | ICD-10-CM | POA: Diagnosis not present

## 2014-10-17 DIAGNOSIS — E119 Type 2 diabetes mellitus without complications: Secondary | ICD-10-CM | POA: Insufficient documentation

## 2014-10-17 DIAGNOSIS — M542 Cervicalgia: Secondary | ICD-10-CM | POA: Diagnosis present

## 2014-10-17 DIAGNOSIS — Z992 Dependence on renal dialysis: Secondary | ICD-10-CM | POA: Insufficient documentation

## 2014-10-17 DIAGNOSIS — D649 Anemia, unspecified: Secondary | ICD-10-CM | POA: Insufficient documentation

## 2014-10-17 DIAGNOSIS — M549 Dorsalgia, unspecified: Secondary | ICD-10-CM

## 2014-10-17 DIAGNOSIS — I509 Heart failure, unspecified: Secondary | ICD-10-CM | POA: Insufficient documentation

## 2014-10-17 DIAGNOSIS — Z7952 Long term (current) use of systemic steroids: Secondary | ICD-10-CM | POA: Insufficient documentation

## 2014-10-17 DIAGNOSIS — G8929 Other chronic pain: Secondary | ICD-10-CM

## 2014-10-17 DIAGNOSIS — Z79899 Other long term (current) drug therapy: Secondary | ICD-10-CM | POA: Insufficient documentation

## 2014-10-17 DIAGNOSIS — F329 Major depressive disorder, single episode, unspecified: Secondary | ICD-10-CM | POA: Diagnosis not present

## 2014-10-17 DIAGNOSIS — Z940 Kidney transplant status: Secondary | ICD-10-CM | POA: Diagnosis not present

## 2014-10-17 DIAGNOSIS — F419 Anxiety disorder, unspecified: Secondary | ICD-10-CM | POA: Diagnosis not present

## 2014-10-17 MED ORDER — OXYCODONE-ACETAMINOPHEN 5-325 MG PO TABS
2.0000 | ORAL_TABLET | Freq: Once | ORAL | Status: AC
  Administered 2014-10-17: 2 via ORAL
  Filled 2014-10-17: qty 2

## 2014-10-17 MED ORDER — HYDROMORPHONE HCL 1 MG/ML IJ SOLN
1.0000 mg | Freq: Once | INTRAMUSCULAR | Status: AC
  Administered 2014-10-17: 1 mg via INTRAMUSCULAR
  Filled 2014-10-17: qty 1

## 2014-10-17 MED ORDER — CYCLOBENZAPRINE HCL 10 MG PO TABS
5.0000 mg | ORAL_TABLET | Freq: Once | ORAL | Status: AC
  Administered 2014-10-17: 5 mg via ORAL
  Filled 2014-10-17: qty 1

## 2014-10-17 NOTE — ED Provider Notes (Signed)
CSN: [id number]     Arrival date & time 10/17/14  0847 History   First MD Initiated Contact with Patient 10/17/14 [phone]     Chief Complaint  Patient presents with  . Back Pain     (Consider location/radiation/quality/duration/timing/severity/associated sxs/prior Treatment) Patient is a 49 y.o. male presenting with neck injury. The history is provided by the patient.  Neck Injury This is a chronic problem. The current episode started more than 1 month ago. The problem occurs constantly. The problem has been unchanged. Associated symptoms include arthralgias and chest pain. Pertinent negatives include no coughing, fever, nausea, numbness, vomiting or weakness. Nothing aggravates the symptoms. Treatments tried: flexeril and narcotics. The treatment provided moderate relief.    Past Medical History  Diagnosis Date  . Hypertension   . Depression   . Complication of anesthesia     itching, sore throat  . Diabetes mellitus without complication     No history per patient, but remains under history as A1c would not be accurate given on dialysis  . Shortness of breath   . Anxiety   . ESRD (end stage renal disease)     due to HTN per patient, followed at [facility], s/p failed kidney transplant - dialysis Tue, Th, Sat  . Renal insufficiency   . CHF (congestive heart failure)   . Anemia    Past Surgical History  Procedure Laterality Date  . Kidney receipient  2006    failed and started HD in March 2014  . Capd insertion    . Capd removal    . Left heart catheterization with coronary angiogram N/A 09/02/2014    Procedure: LEFT HEART CATHETERIZATION WITH CORONARY ANGIOGRAM;  Surgeon: [name], MD;  Location[facility] CATH LAB;  Service: Cardiovascular;  Laterality: N/A;   History reviewed. No pertinent family history. History  Substance Use Topics  . Smoking status: Former Smoker -- 1.00 packs/day for 1 years    Types: Cigarettes  . Smokeless tobacco: Never Used     Comment: quit Jan 2014   . Alcohol Use: No    Review of Systems  Constitutional: Negative for fever.  Respiratory: Negative for cough.   Cardiovascular: Positive for chest pain.  Gastrointestinal: Negative for nausea and vomiting.  Musculoskeletal: Positive for arthralgias.  Neurological: Negative for weakness and numbness.  All other systems reviewed and are negative.     Allergies  Ferrlecit and Darvocet  Home Medications   Prior to Admission medications   Medication Sig Start Date End Date Taking? Authorizing Provider  ALPRAZolam (XANAX) 0.25 MG tablet Take 0.25 mg by mouth daily as needed. For sleep 06/25/14   Historical Provider, MD  amLODipine (NORVASC) 5 MG tablet Take 10 mg by mouth daily.    Historical Provider, MD  atorvastatin (LIPITOR) 40 MG tablet Take 1 tablet (40 mg total) by mouth daily at 6 PM. Patient not taking: Reported on 09/19/2014 09/02/14   Barton Dubois, MD  carvedilol (COREG) 25 MG tablet Take 1 tablet (25 mg total) by mouth 2 (two) times daily with a meal. 10/10/14   [name], DO  cinacalcet (SENSIPAR) 30 MG tablet Take 30 mg by mouth daily.    Historical Provider, MD  cloNIDine (CATAPRES - DOSED IN MG/24 HR) 0.3 mg/24hr patch Place 1 patch onto the skin. Every 7 days 08/08/14   Historical Provider, MD  doxercalciferol (HECTOROL) 4 MCG/2ML injection Inject 1.25 mLs (2.5 mcg total) into the vein Every Tuesday,Thursday,and Saturday with dialysis. 10/10/14   Janett Billow  U Vann, DO  Ferric Citrate 210 MG TABS Take 210 mg by mouth 3 (three) times daily with meals. Take 1 with each meal and 1 with a snack. 08/08/14   Historical Provider, MD  gentamicin cream (GARAMYCIN) 0.1 % Apply to [place] site 06/13/14   Historical Provider, MD  hydrALAZINE (APRESOLINE) 25 MG tablet Take 100 mg by mouth 3 (three) times daily.     Historical Provider, MD  hydrocortisone 1 % lotion Apply to site with itch as needed twice daily 06/13/14   Historical Provider, MD  levalbuterol [facility] HFA) 45 MCG/ACT  inhaler Inhale 2 puffs into the lungs every 4 (four) hours as needed for wheezing or shortness of breath. 10/02/14   Allie Bossier, MD  lisinopril (PRINIVIL,ZESTRIL) 10 MG tablet Take 1 tablet (10 mg total) by mouth daily. 10/10/14   [name], DO  methocarbamol (ROBAXIN-750) 750 MG tablet Take 1 tablet (750 mg total) by mouth every 6 (six) hours as needed for muscle spasms. 09/19/14   Linton Flemings, MD  minoxidil (LONITEN) 2.5 MG tablet Take 1 tablet (2.5 mg total) by mouth 2 (two) times daily. 10/10/14   [name], DO  omeprazole (PRILOSEC) 20 MG capsule Take 20 mg by mouth daily. 07/01/13   Historical Provider, MD  oxyCODONE (ROXICODONE) 5 MG immediate release tablet Take 1 tablet (5 mg total) by mouth every 4 (four) hours as needed for severe pain. 10/10/14   [name], DO  polyethylene glycol (MIRALAX) packet Take 17 g by mouth daily. For constipation Patient taking differently: Take 17 g by mouth daily as needed for mild constipation. For constipation 05/12/14   Domenic Polite, MD  predniSONE (DELTASONE) 10 MG tablet Take 10 mg by mouth daily with breakfast.    Historical Provider, MD  sevelamer carbonate (RENVELA) 800 MG tablet Take 1,600 mg by mouth 3 (three) times daily with meals. 12/27/13 12/27/14  Historical Provider, MD  sulfamethoxazole-trimethoprim (BACTRIM,SEPTRA) 400-80 MG per tablet Take 1 tablet by mouth every Monday, Wednesday, and Friday. 10/10/14   [name], DO  warfarin (COUMADIN) 2 MG tablet Take 1 tablet (2 mg total) by mouth daily. 10/10/14   Jessica U Vann, DO   BP 199/132 mmHg  Pulse 91  Temp(Src) 97.8 F (36.6 C) (Oral)  Resp 25  SpO2 97% Physical Exam  Constitutional: He is oriented to person, place, and time. He appears well-developed and well-nourished. No distress.  HENT:  Head: Normocephalic and atraumatic.  Mouth/Throat: Oropharynx is clear and moist. No oropharyngeal exudate.  Eyes: Conjunctivae and EOM are normal. Pupils are equal, round, and  reactive to light.  Neck: Normal range of motion. Neck supple.  Cardiovascular: Normal rate, regular rhythm, normal heart sounds and intact distal pulses.  Exam reveals no gallop and no friction rub.   No murmur heard. Pulmonary/Chest: Effort normal and breath sounds normal. No respiratory distress. He has no wheezes. He has no rales.  Abdominal: Soft. He exhibits no distension and no mass. There is no tenderness. There is no rebound and no guarding.  Musculoskeletal: Normal range of motion. He exhibits tenderness. He exhibits no edema.  Spasming and tenderness of left paraspinal neck and upper back. No tenderness over the left shoulder. Passive range of motion without difficulty. Active range of motion with pain. No warmth or erythema over the joint. No midline cervical or thoracic tenderness step-offs or deformities. Left arm neurovascularly intact.  Lymphadenopathy:    He has no cervical adenopathy.  Neurological: He  is alert and oriented to person, place, and time. No cranial nerve deficit.  Skin: Skin is warm and dry. No rash noted. He is not diaphoretic.  Psychiatric: He has a normal mood and affect. His behavior is normal. Judgment and thought content normal.  Nursing note and vitals reviewed.   ED Course  Procedures (including critical care time) Labs Review Labs Reviewed - No data to display  Imaging Review No results found.   EKG Interpretation None      MDM   Final diagnoses:  Chronic upper back pain   49 year old male with end-stage renal disease on Tuesday Thursday Saturday dialysis as well as essential hypertension, CHF presents with injuries to left shoulder and back pain. Symptoms have been present since catheterization in early February. He has had extensive workup with a clean cath, negative pulmonary embolus study, negative CT angiography, negative repeat troponins. These evaluations have been performed over the last month and a half with a emergency department  stay sober that time. He has not missed any recent dialysis sessions. He is hypertensive which she attributes to his pain as well as his chronic hypertension. He denies chest pain at this time. Also denies any associated shortness of breath. He is a well-appearing male with obvious spasming and tenderness of the left neck and shoulder. No evidence of trauma and no warmth or erythema to suggest infectious etiology or septic joint. Given multiple previous workups status post catheterization, no repeat imaging will be performed at this time. He has obviously musculoskeletal pain in his left shoulder. Also of note, his narcotics were decreased from oxycodone 15 mg every 4-6 hours to oxycodone 5 mg every 8 hours at the same time as his catheterization. His pain has been persistent since that time. He is scheduled to follow up with chronic pain on Thursday but has not yet had that appointment.  EKG with normal sinus rhythm and PVC present. He has Q waves septally which is unchanged from previous. He also has T-wave inversions in V6 which is also unchanged from previous. Given chronicity of symptoms, previous extensive workup, and very musculoskeletal nature of his pain, we treated his pain with 1 dose of IM narcotics in the emergency department. I discussed need for follow-up with chronic pain in his resources were given. Return precautions discussed.  Larence Penning, MD 10/17/14 1130  Elnora Morrison, MD 10/17/14 [phone]

## 2014-10-17 NOTE — ED Notes (Signed)
Pt c/o lower neck upper back pain for past couple weeks. Pt sts pain gets better when he is relaxing/resting. Pt sts neck/back pain since a cardiac cath. Pain 10/10 Denies fevers. Sts he has had coughs in the past. Pt sts he couldn't finish dialysis on Thursday because pain was so severe. Pt sts he had a full dialysis on Saturday BP 208/136.

## 2014-10-17 NOTE — Discharge Instructions (Signed)
Emergency Department Resource Guide 1) Find a Doctor and Pay Out of Pocket Although you won't have to find out who is covered by your insurance plan, it is a good idea to ask around and get recommendations. You will then need to call the office and see if the doctor you have chosen will accept you as a new patient and what types of options they offer for patients who are self-pay. Some doctors offer discounts or will set up payment plans for their patients who do not have insurance, but you will need to ask so you aren't surprised when you get to your appointment.  2) Contact Your Local Health Department Not all health departments have doctors that can see patients for sick visits, but many do, so it is worth a call to see if yours does. If you don't know where your local health department is, you can check in your phone book. The CDC also has a tool to help you locate your state's health department, and many state websites also have listings of all of their local health departments.  3) Find a [facility] If your illness is not likely to be very severe or complicated, you may want to try a walk in clinic. These are popping up all over the country in pharmacies, drugstores, and shopping centers. They're usually staffed by nurse practitioners or physician assistants that have been trained to treat common illnesses and complaints. They're usually fairly quick and inexpensive. However, if you have serious medical issues or chronic medical problems, these are probably not your best option.  No Primary Care Doctor: - Call Health Connect at  [phone] - they can help you locate a primary care doctor that  accepts your insurance, provides certain services, etc. - Physician Referral Service- [phone]  Chronic Pain Problems: Organization         Address  Phone   Notes  [facility]  [phone] Patients need to be referred by their primary care doctor.   Medication  Assistance: Organization         Address  Phone   Notes  [facility] Medication [facility] [address] [place], [place] [address] [phone] --Must be a resident of [place] [facility] -- Must have NO insurance coverage whatsoever (no Medicaid/ Medicare, etc.) -- The pt. MUST have a primary care doctor that directs their care regularly and follows them in the community   [facility]  [phone]   [employer]  [phone]    Agencies that provide inexpensive medical care: Organization         Address  Phone   Notes  [place]  [phone]   [facility]    [phone]   [facility] [place], [place] [address] [phone]   [place] [name] [place] [phone]   Planned Parenthood    [phone]   [facility]    [phone]   [place] and [address], [place] Phone:  [phone], Fax:  [phone] Hours of Operation:  9 am - 6 pm, M-F.  Also accepts Medicaid/Medicare and self-pay.  [facility] for [place], [address],  Phone: [phone], Fax: [phone]. Hours of Operation:  8:30 am - 5:30 pm, M-F.  Also accepts Medicaid and self-pay.  [facility] [place] [address], [place] Phone: [phone]   [place], [place], [place] [phone], Ext. [phone] Mondays & Thursdays: 7-9 AM.  First 15 patients are seen on a first come, first serve basis.    [place] Providers:  Organization         Address  Phone   Notes  [facility] [address], [place] [phone] Also accepts self-pay patients.  [facility] [address], [place]  [phone]   [place], [address], [place]  [phone]   [facility] [address], [place] [phone]   Lucianne Lei [address], [place]   [phone] Only accepts [place] Access [place] patients after they have their name applied to their card.   Self-Pay (no insurance) in [facility]:  Organization         Address  Phone   Notes  Sickle Cell Patients, [facility] Internal [facility] [place] [phone]   [facility] [place] [phone]   [facility] [place]  [place], [place], [place] [phone]   [facility]/Dr. Osei-Bonsu  [address] [place] or [address], [place] [phone] Phone number for both [place] and [place] locations is the same.  [facility] [address] [place] [phone]   [facility] [address] [place] or [address] [phone]   [facility] [address], [place] [phone], phone; [phone], fax Sees patients 1st and 3rd Saturday of every month.  Must not qualify for public or private insurance (i.e. Medicaid, Medicare, [place] Health Choice, Veterans' Benefits)  Household income should be no more than 200% of the poverty level The clinic cannot treat you if you are pregnant or think you are pregnant  Sexually transmitted diseases are not treated at the clinic.    Dental Care: Organization         Address  Phone  Notes  [place] [facility] [place] [phone] Accepts children up to age 4 who are enrolled in [place] or [place]; pregnant women with a Medicaid card; and children who have applied for Medicaid or [place] Health Choice, but were declined, whose parents can pay a reduced fee at time of service.  [facility]  [facility]  [address], [place] [phone] Accepts children up to age 35 who are enrolled in [place] or [place]; pregnant women with a Medicaid card; and children who have applied for Medicaid or [place] Health Choice, but were declined, whose parents can pay a reduced fee at time of service.  [place] [facility] Access [facility]  [place] [phone] Patients are seen by appointment only. Walk-ins are not accepted. [place] will see patients 43 years of age and older. Monday - Tuesday (8am-5pm) Most Wednesdays (8:30-5pm) $30 per visit, cash only  [facility] Adult Dental Access PROGRAM  [address], [facility] [phone] Patients are seen by appointment only. Walk-ins are not accepted. [place] will see patients 40 years of age and older. One  Wednesday Evening (Monthly: Volunteer Based).  $30 per visit, cash only  °[facility]  [phone] for adults; Children under age 4, call Graduate Pediatric Dentistry at [phone]. Children aged 4-14, please call [phone] to request a pediatric application. ° Dental services are provided in all areas of dental care including fillings, crowns and bridges, complete and partial dentures, implants, gum treatment, root canals, and extractions. Preventive care is also provided. Treatment is provided to both adults and children. °Patients are selected via a lottery and there is often a waiting list. °  °[facility] [address], °[place] ° ([phone] [email or web address] °  °[facility] [address], [place], [place] ([phone], Ext. [phone] Second and Fourth Thursday of each month, opens at 6:30 AM; Clinic ends at 9 AM.  Patients are seen on a first-come first-served basis, and a limited number are seen during each clinic.  ° °[facility] ° [address], [place], [place] [phone]    Eligibility Requirements °You must have lived in [place], [place], or [place] counties for at least the last three months. °  You cannot be eligible for state or federal sponsored healthcare insurance, including Veterans Administration, Medicaid, or Medicare. °  You generally cannot be eligible for healthcare insurance through your employer.  °  How to apply: °Eligibility screenings are held every Tuesday and Wednesday afternoon from 1:00 pm until 4:00 pm. You do not need an appointment for the interview!  °[facility] [address], [place], [place] [phone]   °[facility]  [phone]   °[facility]  [phone]   °[facility]  336-570-6415   ° °Behavioral Health Resources in the Community: °Intensive Outpatient Programs °Organization         Address  Phone  Notes  °[facility] [address], [place], [place] [phone]   °[facility] [address], [place], [place] [phone]   °[facility] [address], [place], [place] ° 336-882-2125   °[facility] [address],  °[place], [place] 1-800-853-5163 or 336-641-4981   °Substance Abuse Resources °Organization         Address  Phone  Notes  °[facility]  336-882-2125   °[facility]  336-784-9470   °[facility]  336-285-9073   °[facility]  336-845-3988   °Residential & Outpatient Substance Abuse Program  1-800-659-3381   °Psychological Services °Organization         Address  Phone  Notes  °[facility]  336- [phone]   °[facility]  336- [phone]   °[facility] [address], [place] 1-800-853-5163 or 336-641-4981   ° °Mobile Crisis Teams °Organization         Address  Phone  Notes  °[facility], Mobile Crisis Care Unit  1-877-626-1772   °Assertive °Psychotherapeutic Services ° [address]  [place], [place] 336-834-9664   °Sharon DeEsch [address] °[place] 336-554-5454   ° °Self-Help/Support Groups °Organization         Address  Phone             Notes  °[facility] [place] - variety of support groups  336- 373-1402 Call for more information  °Narcotics Anonymous (NA), [facility] [address], °[place]   2 meetings at this location  ° °  Residential Treatment Programs Organization         Address  Phone  Notes  [facility] [address]    [place]  [phone]   [facility]  [address] [place] [address], [place], [place]   [place], [place] [phone] Admissions: 8am-3pm M-F  Incentives Substance [facility] [address]    [place], [place] [phone]   [facility] [address] [place], [place], [place]   [facility] [address],  [place], [place]   [facility] - Intensive Outpatient [address], [place], [place]   [facility] ([place] [address]  [place], [place] 1-575-373-5017 or [phone]   [facility] ([facility]) [address], [place], [place] Accepts Medicaid  [facility] [place] [address],  [place] [phone] Substance Abuse/Addiction Treatment   [facility]         Address  Phone  Notes  [facility]  [phone]   Domenic Schwab, PhD [address] [place], [place]   ([phone] or [phone]   [place], [place] [phone]   [facility] [address] [place], [place] [phone] Insurance/Medicaid/sponsorship through [facility] and Families [address]                                    [place], [place] [phone] [place] [address], [place] [phone]    Dr. Adele Schilder  [phone]   [facility] Dept. 1) [address], [place] 2) [place] 3)  [place] [address], [place] [phone]  [phone]   [place] [phone] or [phone] (After Hours)

## 2014-10-21 ENCOUNTER — Emergency Department (HOSPITAL_COMMUNITY)
Admission: EM | Admit: 2014-10-21 | Discharge: 2014-10-21 | Disposition: A | Discharge status: Home / Self Care | Payer: Medicare Other | Attending: Emergency Medicine | Admitting: Emergency Medicine

## 2014-10-21 ENCOUNTER — Inpatient Hospital Stay: Payer: Medicare Other | Admitting: Internal Medicine

## 2014-10-21 ENCOUNTER — Encounter (HOSPITAL_COMMUNITY): Payer: Self-pay | Admitting: Emergency Medicine

## 2014-10-21 DIAGNOSIS — F419 Anxiety disorder, unspecified: Secondary | ICD-10-CM | POA: Insufficient documentation

## 2014-10-21 DIAGNOSIS — Z7901 Long term (current) use of anticoagulants: Secondary | ICD-10-CM | POA: Insufficient documentation

## 2014-10-21 DIAGNOSIS — N186 End stage renal disease: Secondary | ICD-10-CM | POA: Insufficient documentation

## 2014-10-21 DIAGNOSIS — M549 Dorsalgia, unspecified: Secondary | ICD-10-CM | POA: Diagnosis not present

## 2014-10-21 DIAGNOSIS — Z992 Dependence on renal dialysis: Secondary | ICD-10-CM | POA: Diagnosis not present

## 2014-10-21 DIAGNOSIS — F329 Major depressive disorder, single episode, unspecified: Secondary | ICD-10-CM | POA: Insufficient documentation

## 2014-10-21 DIAGNOSIS — I120 Hypertensive chronic kidney disease with stage 5 chronic kidney disease or end stage renal disease: Secondary | ICD-10-CM | POA: Insufficient documentation

## 2014-10-21 DIAGNOSIS — Z862 Personal history of diseases of the blood and blood-forming organs and certain disorders involving the immune mechanism: Secondary | ICD-10-CM | POA: Insufficient documentation

## 2014-10-21 DIAGNOSIS — M25512 Pain in left shoulder: Secondary | ICD-10-CM | POA: Diagnosis present

## 2014-10-21 DIAGNOSIS — M542 Cervicalgia: Secondary | ICD-10-CM | POA: Insufficient documentation

## 2014-10-21 DIAGNOSIS — I509 Heart failure, unspecified: Secondary | ICD-10-CM | POA: Insufficient documentation

## 2014-10-21 DIAGNOSIS — Z87891 Personal history of nicotine dependence: Secondary | ICD-10-CM | POA: Diagnosis not present

## 2014-10-21 DIAGNOSIS — M62838 Other muscle spasm: Secondary | ICD-10-CM | POA: Diagnosis not present

## 2014-10-21 DIAGNOSIS — E119 Type 2 diabetes mellitus without complications: Secondary | ICD-10-CM | POA: Diagnosis not present

## 2014-10-21 DIAGNOSIS — Z7952 Long term (current) use of systemic steroids: Secondary | ICD-10-CM | POA: Diagnosis not present

## 2014-10-21 DIAGNOSIS — Z79899 Other long term (current) drug therapy: Secondary | ICD-10-CM | POA: Insufficient documentation

## 2014-10-21 DIAGNOSIS — R030 Elevated blood-pressure reading, without diagnosis of hypertension: Secondary | ICD-10-CM

## 2014-10-21 DIAGNOSIS — IMO0001 Reserved for inherently not codable concepts without codable children: Secondary | ICD-10-CM

## 2014-10-21 LAB — I-STAT CHEM 8, ED
BUN: 46 mg/dL — ABNORMAL HIGH (ref 6–23)
CALCIUM ION: 1.02 mmol/L — AB (ref 1.12–1.23)
Chloride: 103 mmol/L (ref 96–112)
Creatinine, Ser: 11.5 mg/dL — ABNORMAL HIGH (ref 0.50–1.35)
Glucose, Bld: 92 mg/dL (ref 70–99)
HCT: 26 % — ABNORMAL LOW (ref 39.0–52.0)
HEMOGLOBIN: 8.8 g/dL — AB (ref 13.0–17.0)
Potassium: 4.7 mmol/L (ref 3.5–5.1)
SODIUM: 139 mmol/L (ref 135–145)
TCO2: 20 mmol/L (ref 0–100)

## 2014-10-21 MED ORDER — METHOCARBAMOL 500 MG PO TABS: 1000.0000 mg | ORAL_TABLET | Freq: Three times a day (TID) | ORAL | Status: DC | PRN

## 2014-10-21 MED ORDER — CLONIDINE HCL 0.2 MG PO TABS
0.2000 mg | ORAL_TABLET | Freq: Once | ORAL | Status: AC
  Administered 2014-10-21: 0.2 mg via ORAL
  Filled 2014-10-21: qty 1

## 2014-10-21 MED ORDER — METHOCARBAMOL 500 MG PO TABS
1000.0000 mg | ORAL_TABLET | Freq: Once | ORAL | Status: AC
  Administered 2014-10-21: 1000 mg via ORAL
  Filled 2014-10-21: qty 2

## 2014-10-21 NOTE — ED Notes (Signed)
Patient wandering in the hallways, stating he is looking for restroom.

## 2014-10-21 NOTE — ED Notes (Signed)
Reported BP 180/102 to Dr. Lita Mains, preparing for discharge.

## 2014-10-21 NOTE — ED Notes (Signed)
Patient now sitting on the side of the bed.

## 2014-10-21 NOTE — Discharge Instructions (Signed)
Muscle Cramps and Spasms Muscle cramps and spasms occur when a muscle or muscles tighten and you have no control over this tightening (involuntary muscle contraction). They are a common problem and can develop in any muscle. The most common place is in the calf muscles of the leg. Both muscle cramps and muscle spasms are involuntary muscle contractions, but they also have differences:   Muscle cramps are sporadic and painful. They may last a few seconds to a quarter of an hour. Muscle cramps are often more forceful and last longer than muscle spasms.  Muscle spasms may or may not be painful. They may also last just a few seconds or much longer. CAUSES  It is uncommon for cramps or spasms to be due to a serious underlying problem. In many cases, the cause of cramps or spasms is unknown. Some common causes are:   Overexertion.   Overuse from repetitive motions (doing the same thing over and over).   Remaining in a certain position for a long period of time.   Improper preparation, form, or technique while performing a sport or activity.   Dehydration.   Injury.   Side effects of some medicines.   Abnormally low levels of the salts and ions in your blood (electrolytes), especially potassium and calcium. This could happen if you are taking water pills (diuretics) or you are pregnant.  Some underlying medical problems can make it more likely to develop cramps or spasms. These include, but are not limited to:   Diabetes.   Parkinson disease.   Hormone disorders, such as thyroid problems.   Alcohol abuse.   Diseases specific to muscles, joints, and bones.   Blood vessel disease where not enough blood is getting to the muscles.  HOME CARE INSTRUCTIONS   Stay well hydrated. Drink enough water and fluids to keep your urine clear or pale yellow.  It may be helpful to massage, stretch, and relax the affected muscle.  For tight or tense muscles, use a warm towel, heating  pad, or hot shower water directed to the affected area.  If you are sore or have pain after a cramp or spasm, applying ice to the affected area may relieve discomfort.  Put ice in a plastic bag.  Place a towel between your skin and the bag.  Leave the ice on for 15-20 minutes, 03-04 times a day.  Medicines used to treat a known cause of cramps or spasms may help reduce their frequency or severity. Only take over-the-counter or prescription medicines as directed by your caregiver. SEEK MEDICAL CARE IF:  Your cramps or spasms get more severe, more frequent, or do not improve over time.  MAKE SURE YOU:   Understand these instructions.  Will watch your condition.  Will get help right away if you are not doing well or get worse. Document Released: 12/28/2001 Document Revised: 11/02/2012 Document Reviewed: 06/24/2012 [facility] Patient Information 2015 [place], [place]. This information is not intended to replace advice given to you by your health care provider. Make sure you discuss any questions you have with your health care provider.  Hypertension Hypertension, commonly called high blood pressure, is when the force of blood pumping through your arteries is too strong. Your arteries are the blood vessels that carry blood from your heart throughout your body. A blood pressure reading consists of a higher number over a lower number, such as 110/72. The higher number (systolic) is the pressure inside your arteries when your heart pumps. The lower number (diastolic)  is the pressure inside your arteries when your heart relaxes. Ideally you want your blood pressure below 120/80. Hypertension forces your heart to work harder to pump blood. Your arteries may become narrow or stiff. Having hypertension puts you at risk for heart disease, stroke, and other problems.  RISK FACTORS Some risk factors for high blood pressure are controllable. Others are not.  Risk factors you cannot control include:    Race. You may be at higher risk if you are African American.  Age. Risk increases with age.  Gender. Men are at higher risk than women before age 60 years. After age 81, women are at higher risk than men. Risk factors you can control include:  Not getting enough exercise or physical activity.  Being overweight.  Getting too much fat, sugar, calories, or salt in your diet.  Drinking too much alcohol. SIGNS AND SYMPTOMS Hypertension does not usually cause signs or symptoms. Extremely high blood pressure (hypertensive crisis) may cause headache, anxiety, shortness of breath, and nosebleed. DIAGNOSIS  To check if you have hypertension, your health care provider will measure your blood pressure while you are seated, with your arm held at the level of your heart. It should be measured at least twice using the same arm. Certain conditions can cause a difference in blood pressure between your right and left arms. A blood pressure reading that is higher than normal on one occasion does not mean that you need treatment. If one blood pressure reading is high, ask your health care provider about having it checked again. TREATMENT  Treating high blood pressure includes making lifestyle changes and possibly taking medicine. Living a healthy lifestyle can help lower high blood pressure. You may need to change some of your habits. Lifestyle changes may include:  Following the DASH diet. This diet is high in fruits, vegetables, and whole grains. It is low in salt, red meat, and added sugars.  Getting at least 2 hours of brisk physical activity every week.  Losing weight if necessary.  Not smoking.  Limiting alcoholic beverages.  Learning ways to reduce stress. If lifestyle changes are not enough to get your blood pressure under control, your health care provider may prescribe medicine. You may need to take more than one. Work closely with your health care provider to understand the risks and  benefits. HOME CARE INSTRUCTIONS  Have your blood pressure rechecked as directed by your health care provider.   Take medicines only as directed by your health care provider. Follow the directions carefully. Blood pressure medicines must be taken as prescribed. The medicine does not work as well when you skip doses. Skipping doses also puts you at risk for problems.   Do not smoke.   Monitor your blood pressure at home as directed by your health care provider. SEEK MEDICAL CARE IF:   You think you are having a reaction to medicines taken.  You have recurrent headaches or feel dizzy.  You have swelling in your ankles.  You have trouble with your vision. SEEK IMMEDIATE MEDICAL CARE IF:  You develop a severe headache or confusion.  You have unusual weakness, numbness, or feel faint.  You have severe chest or abdominal pain.  You vomit repeatedly.  You have trouble breathing. MAKE SURE YOU:   Understand these instructions.  Will watch your condition.  Will get help right away if you are not doing well or get worse. Document Released: 07/08/2005 Document Revised: 11/22/2013 Document Reviewed: 04/30/2013 [employer] Patient Information 2015 [place]. This information is not intended to replace advice given to you by your health care provider. Make sure you discuss any questions you have with your health care provider.

## 2014-10-21 NOTE — ED Notes (Signed)
Allowed patient to have ice chips only at this time.

## 2014-10-21 NOTE — ED Notes (Signed)
Discussed bp after clonidine, reported patient would like to speak to MD. MD acknowledges, no new orders at this time.

## 2014-10-21 NOTE — ED Notes (Signed)
Dr. Lita Mains at the bedside.

## 2014-10-21 NOTE — ED Provider Notes (Signed)
CSN: [id number]     Arrival date & time 10/21/14  0115 History  This chart was scribed for [name], MD by Rayfield Citizen, ED Scribe. This patient was seen in room B16C/B16C and the patient's care was started at 2:51 AM.    Chief Complaint  Patient presents with  . Shoulder Pain  . Back Pain   Patient is a 49 y.o. male presenting with shoulder pain and back pain. The history is provided by the patient. No language interpreter was used.  Shoulder Pain Associated symptoms: back pain and neck pain   Associated symptoms: no fever   Back Pain Associated symptoms: no abdominal pain, no chest pain, no fever, no headaches, no numbness and no weakness     HPI Comments: Frank [name] is a 49 y.o. male with past medical history of HTN, DM, SOB, CHF, ESRD (dialysis Tues, Thurs, Sat) who presents to the Emergency Department complaining of chronic left shoulder and left-sided back and neck pain, acutely worsening within the past week. He denies recent trauma or injury. Patient reports that this issue has been ongoing for at least two months now after his catheterization; he has been seen for this multiple times, with extensive work up and treatment with narcotic pain medications and muscle relaxants.   Patient reports that he has missed Thursday's dialysis appointment; he states he has missed multiple appointments.   Past Medical History  Diagnosis Date  . Hypertension   . Depression   . Complication of anesthesia     itching, sore throat  . Diabetes mellitus without complication     No history per patient, but remains under history as A1c would not be accurate given on dialysis  . Shortness of breath   . Anxiety   . ESRD (end stage renal disease)     due to HTN per patient, followed at [facility], s/p failed kidney transplant - dialysis Tue, Th, Sat  . Renal insufficiency   . CHF (congestive heart failure)   . Anemia    Past Surgical History  Procedure Laterality Date  . Kidney receipient   2006    failed and started HD in March 2014  . Capd insertion    . Capd removal    . Left heart catheterization with coronary angiogram N/A 09/02/2014    Procedure: LEFT HEART CATHETERIZATION WITH CORONARY ANGIOGRAM;  Surgeon: [name], MD;  Location: [facility] CATH LAB;  Service: Cardiovascular;  Laterality: N/A;   No family history on file. History  Substance Use Topics  . Smoking status: Former Smoker -- 1.00 packs/day for 1 years    Types: Cigarettes  . Smokeless tobacco: Never Used     Comment: quit Jan 2014  . Alcohol Use: No    Review of Systems  Constitutional: Negative for fever and chills.  Respiratory: Negative for cough and shortness of breath.   Cardiovascular: Negative for chest pain.  Gastrointestinal: Negative for nausea, vomiting, abdominal pain, diarrhea and constipation.  Musculoskeletal: Positive for myalgias, back pain and neck pain. Negative for neck stiffness.  Skin: Negative for rash and wound.  Neurological: Negative for dizziness, weakness, light-headedness, numbness and headaches.  All other systems reviewed and are negative.   Allergies  Ferrlecit and Darvocet  Home Medications   Prior to Admission medications   Medication Sig Start Date End Date Taking? Authorizing Provider  ALPRAZolam (XANAX) 0.25 MG tablet Take 0.25 mg by mouth daily as needed. For sleep 06/25/14   Historical Provider, MD  amLODipine (  NORVASC) 5 MG tablet Take 10 mg by mouth daily.    Historical Provider, MD  atorvastatin (LIPITOR) 40 MG tablet Take 1 tablet (40 mg total) by mouth daily at 6 PM. Patient not taking: Reported on 09/19/2014 09/02/14   Barton Dubois, MD  carvedilol (COREG) 25 MG tablet Take 1 tablet (25 mg total) by mouth 2 (two) times daily with a meal. 10/10/14   [name], DO  cinacalcet (SENSIPAR) 30 MG tablet Take 30 mg by mouth daily.    Historical Provider, MD  cloNIDine (CATAPRES - DOSED IN MG/24 HR) 0.3 mg/24hr patch Place 1 patch onto the skin. Every 7  days 08/08/14   Historical Provider, MD  doxercalciferol (HECTOROL) 4 MCG/2ML injection Inject 1.25 mLs (2.5 mcg total) into the vein Every Tuesday,Thursday,and Saturday with dialysis. 10/10/14   [name], DO  Ferric Citrate 210 MG TABS Take 210 mg by mouth 3 (three) times daily with meals. Take 1 with each meal and 1 with a snack. 08/08/14   Historical Provider, MD  gentamicin cream (GARAMYCIN) 0.1 % Apply to [place] site 06/13/14   Historical Provider, MD  hydrALAZINE (APRESOLINE) 25 MG tablet Take 100 mg by mouth 3 (three) times daily.     Historical Provider, MD  hydrocortisone 1 % lotion Apply to site with itch as needed twice daily 06/13/14   Historical Provider, MD  levalbuterol [facility] HFA) 45 MCG/ACT inhaler Inhale 2 puffs into the lungs every 4 (four) hours as needed for wheezing or shortness of breath. 10/02/14   Allie Bossier, MD  lisinopril (PRINIVIL,ZESTRIL) 10 MG tablet Take 1 tablet (10 mg total) by mouth daily. 10/10/14   [name], DO  methocarbamol (ROBAXIN) 500 MG tablet Take 2 tablets (1,000 mg total) by mouth every 8 (eight) hours as needed for muscle spasms. 10/21/14   [name], MD  minoxidil (LONITEN) 2.5 MG tablet Take 1 tablet (2.5 mg total) by mouth 2 (two) times daily. 10/10/14   [name], DO  omeprazole (PRILOSEC) 20 MG capsule Take 20 mg by mouth daily. 07/01/13   Historical Provider, MD  oxyCODONE (ROXICODONE) 5 MG immediate release tablet Take 1 tablet (5 mg total) by mouth every 4 (four) hours as needed for severe pain. 10/10/14   [name], DO  polyethylene glycol (MIRALAX) packet Take 17 g by mouth daily. For constipation Patient taking differently: Take 17 g by mouth daily as needed for mild constipation. For constipation 05/12/14   Domenic Polite, MD  predniSONE (DELTASONE) 10 MG tablet Take 10 mg by mouth daily with breakfast.    Historical Provider, MD  sevelamer carbonate (RENVELA) 800 MG tablet Take 1,600 mg by mouth 3 (three) times  daily with meals. 12/27/13 12/27/14  Historical Provider, MD  sulfamethoxazole-trimethoprim (BACTRIM,SEPTRA) 400-80 MG per tablet Take 1 tablet by mouth every Monday, Wednesday, and Friday. 10/10/14   [name], DO  warfarin (COUMADIN) 2 MG tablet Take 1 tablet (2 mg total) by mouth daily. 10/10/14   Jessica U Vann, DO   BP 180/102 mmHg  Pulse 82  Temp(Src) 97.9 F (36.6 C) (Oral)  Resp 18  Ht 6' 1.5" (1.867 m)  Wt 180 lb (81.647 kg)  BMI 23.42 kg/m2  SpO2 100% Physical Exam  Constitutional: He is oriented to person, place, and time. He appears well-developed and well-nourished. No distress.  Uncooperative with exam  HENT:  Head: Normocephalic and atraumatic.  Mouth/Throat: Oropharynx is clear and moist.  Eyes: EOM are normal.  Pupils are equal, round, and reactive to light.  Neck: Normal range of motion. Neck supple. No JVD present. No tracheal deviation present.  No meningismus. No masses. There is tenderness to palpation along the left trapezius with spasm noted  Cardiovascular: Normal rate and regular rhythm.  Exam reveals no gallop and no friction rub.   No murmur heard. Pulmonary/Chest: Effort normal and breath sounds normal. No respiratory distress. He has no wheezes. He has no rales.  Abdominal: Soft. Bowel sounds are normal. He exhibits no distension and no mass. There is no tenderness. There is no rebound and no guarding.  Musculoskeletal: Normal range of motion. He exhibits no edema or tenderness.  Pain with abduction of the left shoulder. Patient has tenderness to palpation over the left deltoid. Distal pulses intact. No obvious swelling or injury to left upper extremity. No distended veins. No warmth  Lymphadenopathy:    He has no cervical adenopathy.  Neurological: He is alert and oriented to person, place, and time.  Decreased movement of the left arm due to pain. 5/5 grip strength bilaterally. Sensation is fully intact.  Skin: Skin is warm and dry. No rash noted. No  erythema.  Psychiatric: He has a normal mood and affect. His behavior is normal.  Nursing note and vitals reviewed.   ED Course  Procedures   DIAGNOSTIC STUDIES: Oxygen Saturation is 100% on RA, normal by my interpretation.    COORDINATION OF CARE: 2:56 AM Discussed treatment plan with pt at bedside and pt agreed to plan.   Labs Review Labs Reviewed  I-STAT CHEM 8, ED - Abnormal; Notable for the following:    BUN 46 (*)    Creatinine, Ser 11.50 (*)    Calcium, Ion 1.02 (*)    Hemoglobin 8.8 (*)    HCT 26.0 (*)    All other components within normal limits    Imaging Review No results found.   EKG Interpretation None      MDM   Final diagnoses:  Muscle spasm of left shoulder  Elevated blood pressure    I personally performed the services described in this documentation, which was scribed in my presence. The recorded information has been reviewed and is accurate.     Patient's pain is completely reproduced with palpation of the left trapezius. No evidence of any type of vascular injury. Patient's had multiple imaging studies for similar pain without any acute findings. Have advised patient will no longer give any narcotic medication. He received a dose of Robaxin emergency department. Is advised to follow-up with his primary physician for continued concerns. Elevated blood pressure in the emergency department with mild improvement after by mouth clonidine. Patient again advised to follow with her physician for elevated blood pressure. Patient missed last dialysis which is probably contributing to his elevated pressure. Normal potassium. Cautioned against missing dialysis in the future.  [name], MD 10/21/14 [phone]

## 2014-10-21 NOTE — ED Notes (Signed)
Pt. reports persistent left shoulder and left upper back pain for several days , denies injury or fall , pain increases with movement , denies chest pain or SOB .

## 2014-10-21 NOTE — ED Notes (Signed)
After pt returned back from the bathroom, I tried to hook pt back up to monitor. Pt stated, "I have to much going on right now". Pt refused to allow me to hook him back up. Then pt asked for ice. Nurse was notified, ice was given at this time also.

## 2014-10-26 ENCOUNTER — Encounter: Payer: Self-pay | Admitting: Internal Medicine

## 2014-10-26 ENCOUNTER — Ambulatory Visit: Payer: Medicare Other | Attending: Internal Medicine | Admitting: Internal Medicine

## 2014-10-26 VITALS — BP 169/119 | HR 92 | Temp 98.5°F | Resp 17 | Ht 73.0 in | Wt 179.0 lb

## 2014-10-26 DIAGNOSIS — M25512 Pain in left shoulder: Secondary | ICD-10-CM | POA: Diagnosis not present

## 2014-10-26 DIAGNOSIS — R7309 Other abnormal glucose: Secondary | ICD-10-CM | POA: Diagnosis not present

## 2014-10-26 DIAGNOSIS — G479 Sleep disorder, unspecified: Secondary | ICD-10-CM | POA: Diagnosis not present

## 2014-10-26 DIAGNOSIS — T8612 Kidney transplant failure: Secondary | ICD-10-CM

## 2014-10-26 DIAGNOSIS — N186 End stage renal disease: Secondary | ICD-10-CM | POA: Insufficient documentation

## 2014-10-26 DIAGNOSIS — Z992 Dependence on renal dialysis: Secondary | ICD-10-CM

## 2014-10-26 DIAGNOSIS — Z86711 Personal history of pulmonary embolism: Secondary | ICD-10-CM

## 2014-10-26 DIAGNOSIS — Z7901 Long term (current) use of anticoagulants: Secondary | ICD-10-CM | POA: Insufficient documentation

## 2014-10-26 DIAGNOSIS — R7303 Prediabetes: Secondary | ICD-10-CM

## 2014-10-26 LAB — POCT GLYCOSYLATED HEMOGLOBIN (HGB A1C): Hemoglobin A1C: 5.7

## 2014-10-26 LAB — GLUCOSE, POCT (MANUAL RESULT ENTRY): POC GLUCOSE: 96 mg/dL (ref 70–99)

## 2014-10-26 LAB — POCT INR: INR: 1.1

## 2014-10-26 MED ORDER — LORAZEPAM 0.5 MG PO TABS: 0.5000 mg | ORAL_TABLET | Freq: Every evening | ORAL | Status: DC | PRN

## 2014-10-26 NOTE — Progress Notes (Signed)
Pt is here today following up on his prediabetes, HTN, kidney disease, and his PE. Pt is requesting a pain management referral.

## 2014-10-26 NOTE — Patient Instructions (Signed)
You will follow up with Jeg[facility] in 2 weeks

## 2014-10-26 NOTE — Progress Notes (Signed)
Patient ID: Frank [name], male   DOB: May 08, 1966, 49 y.o.   MRN: [id number]  CC: f/u  HPI: Frank [name] is a 49 y.o. male here today for a follow up visit.  Patient has past medical history of ESRD on HD, HTN, prediabetes,  CAD, CHF.  He presents to follow up on recent hospitalization and left shoulder pain. He reports that he has been having shoulder pain since November after having a cardiac cath.  He states that his shoulder "has not been right since then". He has had several imaging test that do not reveal the etiology of the pain.  The pain is located on the anterior shoulder that radiates to his neck and upper back. He states that he ran out of his coumadin for the past two days. He has been on anticoagulation for 6 months for a PE found in October 2015. He states that [facility] hospital told him to quit taking coumadin because his clot had resolved but his Nephrologist at [facility] told him to continue the medication. Patient is requesting to have his care transferred back to [facility] due to transportation issues. He last saw his nephrologist yesterday.  He reports that he was diagnosed with sleep apnea at [facility] 3 months ago. He  struggles with using his cpap machine and feels like it takes him longer to get to sleep at night. He reports that he was on sleep medication in the past and needs a refill. He feels like he is unable to sleep without the medication.    Hospital course 49 year old man with end-stage renal disease likely secondary to refractory hypertension. He has a failed renal transplant. He continues to take immunosuppressive therapy against the advice of his nephrologist. He has both a vascular catheter and a peritoneal dialysis catheter. He has been poorly compliant with his dialysis treatments. He has had a number of visits to the emergency department over the last year for atypical left parasternal chest pain which sometimes simulates ischemic cardiac pain. He does have known  cardiac disease with previous MI. Myoview study done on February 11 showed a fixed perfusion defect in the inferior wall consistent with a previous inferior wall infarction. Additional small moderate reversible defect seen along the anterior wall. There was global hypokinesis, dilated left ventricle, decreased ejection fraction estimated at 35%. There was good correlation with a echocardiogram done at the same time estimating ejection fraction 40-45 percent and also showing diffuse hypokinesis. A left heart cath was done on February 12 with findings of minimal coronary artery disease with 30-40 percent stenosis and a distal portion of a main obtuse marginal branch of the left circumflex artery otherwise normal. A CT angiogram was done to rule out pulmonary embolus and is negative for pulmonary emboli.. Due to the CT findings he was admitted and initially started on treatment for a hospital-acquired pneumonia.  Allergies  Allergen Reactions  . Ferrlecit [Na Ferric Gluc Cplx In Sucrose] Shortness Of Breath, Swelling and Other (See Comments)    Swelling in throat  . Darvocet [Propoxyphene N-Acetaminophen] Hives   Past Medical History  Diagnosis Date  . Hypertension   . Depression   . Complication of anesthesia     itching, sore throat  . Diabetes mellitus without complication     No history per patient, but remains under history as A1c would not be accurate given on dialysis  . Shortness of breath   . Anxiety   . ESRD (end stage renal disease)  due to HTN per patient, followed at [facility], s/p failed kidney transplant - dialysis Tue, Th, Sat  . Renal insufficiency   . CHF (congestive heart failure)   . Anemia    Current Outpatient Prescriptions on File Prior to Visit  Medication Sig Dispense Refill  . ALPRAZolam (XANAX) 0.25 MG tablet Take 0.25 mg by mouth daily as needed. For sleep    . amLODipine (NORVASC) 5 MG tablet Take 10 mg by mouth daily.    [name] atorvastatin (LIPITOR) 40 MG tablet  Take 1 tablet (40 mg total) by mouth daily at 6 PM. 30 tablet 1  . carvedilol (COREG) 25 MG tablet Take 1 tablet (25 mg total) by mouth 2 (two) times daily with a meal.    . cinacalcet (SENSIPAR) 30 MG tablet Take 30 mg by mouth daily.    . cloNIDine (CATAPRES - DOSED IN MG/24 HR) 0.3 mg/24hr patch Place 1 patch onto the skin. Every 7 days    . doxercalciferol (HECTOROL) 4 MCG/2ML injection Inject 1.25 mLs (2.5 mcg total) into the vein Every Tuesday,Thursday,and Saturday with dialysis. 2 mL   . Ferric Citrate 210 MG TABS Take 210 mg by mouth 3 (three) times daily with meals. Take 1 with each meal and 1 with a snack.    [name] gentamicin cream (GARAMYCIN) 0.1 % Apply to [place] site    . hydrALAZINE (APRESOLINE) 25 MG tablet Take 100 mg by mouth 3 (three) times daily.     . hydrocortisone 1 % lotion Apply to site with itch as needed twice daily    . levalbuterol (XOPENEX HFA) 45 MCG/ACT inhaler Inhale 2 puffs into the lungs every 4 (four) hours as needed for wheezing or shortness of breath. 1 Inhaler 0  . lisinopril (PRINIVIL,ZESTRIL) 10 MG tablet Take 1 tablet (10 mg total) by mouth daily. 30 tablet 0  . methocarbamol (ROBAXIN) 500 MG tablet Take 2 tablets (1,000 mg total) by mouth every 8 (eight) hours as needed for muscle spasms. 30 tablet 0  . minoxidil (LONITEN) 2.5 MG tablet Take 1 tablet (2.5 mg total) by mouth 2 (two) times daily. 60 tablet 0  . omeprazole (PRILOSEC) 20 MG capsule Take 20 mg by mouth daily.    [name] oxyCODONE (ROXICODONE) 5 MG immediate release tablet Take 1 tablet (5 mg total) by mouth every 4 (four) hours as needed for severe pain. 10 tablet 0  . polyethylene glycol (MIRALAX) packet Take 17 g by mouth daily. For constipation (Patient taking differently: Take 17 g by mouth daily as needed for mild constipation. For constipation) 14 each 0  . predniSONE (DELTASONE) 10 MG tablet Take 10 mg by mouth daily with breakfast.    . sevelamer carbonate (RENVELA) 800 MG tablet Take 1,600 mg  by mouth 3 (three) times daily with meals.    [name] sulfamethoxazole-trimethoprim (BACTRIM,SEPTRA) 400-80 MG per tablet Take 1 tablet by mouth every Monday, Wednesday, and Friday. 12 tablet 0  . warfarin (COUMADIN) 2 MG tablet Take 1 tablet (2 mg total) by mouth daily. 15 tablet 0   No current facility-administered medications on file prior to visit.   History reviewed. No pertinent family history. History   Social History  . Marital Status: Married    Spouse Name: N/A  . Number of Children: 3  . Years of Education: [employer]   Occupational History  . Not on file.   Social History Main Topics  . Smoking status: Former Smoker -- 1.00 packs/day for 1 years  Types: Cigarettes  . Smokeless tobacco: Never Used     Comment: quit Jan 2014  . Alcohol Use: No  . Drug Use: No  . Sexual Activity: Not Currently   Other Topics Concern  . Not on file   Social History Narrative   Owns own plumbing company    Review of Systems  Cardiovascular: Negative for chest pain.  Musculoskeletal: Positive for joint pain.  Neurological: Positive for headaches.  Psychiatric/Behavioral: The patient has insomnia.   All other systems reviewed and are negative.     Objective:   Filed Vitals:   10/26/14 1206  BP: 169/119  Pulse: 92  Temp: 98.5 F (36.9 C)  Resp: 17    Physical Exam: Constitutional: Patient appears well-developed and well-nourished. No distress. Neck: Normal ROM. Neck supple. No JVD. No tracheal deviation. No thyromegaly. CVS: RRR, S1/S2 +, no murmurs, no gallops, no carotid bruit.  Pulmonary: Effort and breath sounds normal, no stridor, rhonchi, wheezes, rales.  Abdominal: Soft. BS +,  no distension, tenderness, rebound or guarding.  Musculoskeletal: Normal range of motion. No edema and no tenderness.  Neuro: Alert. Normal reflexes, muscle tone coordination. No cranial nerve deficit. Skin: Skin is warm and dry. No rash noted. Not diaphoretic. No erythema. No  pallor. Psychiatric: Normal mood and affect. Behavior, judgment, thought content normal.  Lab Results  Component Value Date   WBC 5.1 10/10/2014   HGB 8.8* 10/21/2014   HCT 26.0* 10/21/2014   MCV 80.4 10/10/2014   PLT 209 10/10/2014   Lab Results  Component Value Date   CREATININE 11.50* 10/21/2014   BUN 46* 10/21/2014   NA 139 10/21/2014   K 4.7 10/21/2014   CL 103 10/21/2014   CO2 29 10/10/2014    Lab Results  Component Value Date   HGBA1C 5.9* 10/17/2013   Lipid Panel  No results found for: CHOL, TRIG, HDL, CHOLHDL, VLDL, LDLCALC     Assessment and plan:   Frank [name] was seen today for follow-up.  Diagnoses and all orders for this visit:  ESRD on dialysis/ailed kidney transplant Orders: -     Ambulatory referral to Nephrology Will refer patient back to [facility] for convienence of transportation. Was patient there in past until referred to [facility] for kidney transplant  Left shoulder pain Orders: -     Ambulatory referral to Orthopedic Surgery Unsure if pain is related to ortho concern or due to complication of cardiac cath  History of pulmonary embolism Orders: -     INR INR is not therauptic at this time, I have reviewed last CT which does show resolution of PE. It is my opinion that patient may stop coumadin but I will refer ultimate decision to Dr. Doreene Burke.   Sleep disturbances Orders: -     LORazepam (ATIVAN) 0.5 MG tablet; Take 1 tablet (0.5 mg total) by mouth at bedtime as needed for anxiety. Patient is requesting Lorrin Mais for sleep but I do not feel comfortable giving patient Lorrin Mais due to history of sleep apnea and ESRD. I will give him a limited supply of ativan and refer further treatment to Dr. Doreene Burke.  Prediabetes Orders: -     Glucose (CBG) -     HgB A1c I have discussed lifestyle modifications with patient   Patient will return in 2-3 weeks to establish with new PCP--Dr. Doreene Burke. I do feel it is appropriate for patient to switch due to  complexity of case.        [name], NP-C [facility] [phone] 10/26/2014, 12:19 PM

## 2014-11-01 ENCOUNTER — Encounter (HOSPITAL_COMMUNITY): Payer: Self-pay | Admitting: Emergency Medicine

## 2014-11-01 ENCOUNTER — Emergency Department (HOSPITAL_COMMUNITY)
Admission: EM | Admit: 2014-11-01 | Discharge: 2014-11-01 | Disposition: A | Discharge status: Home / Self Care | Payer: Medicare Other | Attending: Emergency Medicine | Admitting: Emergency Medicine

## 2014-11-01 ENCOUNTER — Emergency Department (HOSPITAL_COMMUNITY): Payer: Medicare Other

## 2014-11-01 DIAGNOSIS — Z992 Dependence on renal dialysis: Secondary | ICD-10-CM | POA: Diagnosis not present

## 2014-11-01 DIAGNOSIS — Z7952 Long term (current) use of systemic steroids: Secondary | ICD-10-CM | POA: Insufficient documentation

## 2014-11-01 DIAGNOSIS — N186 End stage renal disease: Secondary | ICD-10-CM | POA: Insufficient documentation

## 2014-11-01 DIAGNOSIS — F419 Anxiety disorder, unspecified: Secondary | ICD-10-CM | POA: Insufficient documentation

## 2014-11-01 DIAGNOSIS — Z7901 Long term (current) use of anticoagulants: Secondary | ICD-10-CM | POA: Diagnosis not present

## 2014-11-01 DIAGNOSIS — Z79899 Other long term (current) drug therapy: Secondary | ICD-10-CM | POA: Insufficient documentation

## 2014-11-01 DIAGNOSIS — D649 Anemia, unspecified: Secondary | ICD-10-CM | POA: Insufficient documentation

## 2014-11-01 DIAGNOSIS — I509 Heart failure, unspecified: Secondary | ICD-10-CM | POA: Diagnosis not present

## 2014-11-01 DIAGNOSIS — Z87891 Personal history of nicotine dependence: Secondary | ICD-10-CM | POA: Diagnosis not present

## 2014-11-01 DIAGNOSIS — K921 Melena: Secondary | ICD-10-CM | POA: Diagnosis not present

## 2014-11-01 DIAGNOSIS — I120 Hypertensive chronic kidney disease with stage 5 chronic kidney disease or end stage renal disease: Secondary | ICD-10-CM | POA: Insufficient documentation

## 2014-11-01 DIAGNOSIS — Z9889 Other specified postprocedural states: Secondary | ICD-10-CM | POA: Diagnosis not present

## 2014-11-01 DIAGNOSIS — E119 Type 2 diabetes mellitus without complications: Secondary | ICD-10-CM | POA: Diagnosis not present

## 2014-11-01 LAB — CBC WITH DIFFERENTIAL/PLATELET
BASOS ABS: 0 10*3/uL (ref 0.0–0.1)
BASOS PCT: 0 % (ref 0–1)
Eosinophils Absolute: 0.1 10*3/uL (ref 0.0–0.7)
Eosinophils Relative: 1 % (ref 0–5)
HEMATOCRIT: 25.9 % — AB (ref 39.0–52.0)
Hemoglobin: 8.4 g/dL — ABNORMAL LOW (ref 13.0–17.0)
Lymphocytes Relative: 19 % (ref 12–46)
Lymphs Abs: 1.3 10*3/uL (ref 0.7–4.0)
MCH: 27.5 pg (ref 26.0–34.0)
MCHC: 32.4 g/dL (ref 30.0–36.0)
MCV: 84.9 fL (ref 78.0–100.0)
Monocytes Absolute: 0.6 10*3/uL (ref 0.1–1.0)
Monocytes Relative: 10 % (ref 3–12)
Neutro Abs: 4.6 10*3/uL (ref 1.7–7.7)
Neutrophils Relative %: 70 % (ref 43–77)
Platelets: 202 10*3/uL (ref 150–400)
RBC: 3.05 MIL/uL — ABNORMAL LOW (ref 4.22–5.81)
RDW: 17.9 % — AB (ref 11.5–15.5)
WBC: 6.7 10*3/uL (ref 4.0–10.5)

## 2014-11-01 LAB — COMPREHENSIVE METABOLIC PANEL
ALT: 33 U/L (ref 0–53)
ANION GAP: 14 (ref 5–15)
AST: 79 U/L — ABNORMAL HIGH (ref 0–37)
Albumin: 2.7 g/dL — ABNORMAL LOW (ref 3.5–5.2)
Alkaline Phosphatase: 102 U/L (ref 39–117)
BUN: 64 mg/dL — ABNORMAL HIGH (ref 6–23)
CO2: 23 mmol/L (ref 19–32)
CREATININE: 11.51 mg/dL — AB (ref 0.50–1.35)
Calcium: 8.1 mg/dL — ABNORMAL LOW (ref 8.4–10.5)
Chloride: 103 mmol/L (ref 96–112)
GFR calc Af Amer: 5 mL/min — ABNORMAL LOW (ref >90)
GFR, EST NON AFRICAN AMERICAN: 4 mL/min — AB (ref >90)
Glucose, Bld: 88 mg/dL (ref 70–99)
Potassium: 4.6 mmol/L (ref 3.5–5.1)
Sodium: 140 mmol/L (ref 135–145)
Total Bilirubin: 0.7 mg/dL (ref 0.3–1.2)
Total Protein: 6.5 g/dL (ref 6.0–8.3)

## 2014-11-01 LAB — POC OCCULT BLOOD, ED: Fecal Occult Bld: NEGATIVE

## 2014-11-01 LAB — PROTIME-INR
INR: 1.17 (ref 0.00–1.49)
Prothrombin Time: 15 seconds (ref 11.6–15.2)

## 2014-11-01 MED ORDER — HYDRALAZINE HCL 20 MG/ML IJ SOLN
10.0000 mg | INTRAMUSCULAR | Status: AC
  Administered 2014-11-01: 10 mg via INTRAVENOUS
  Filled 2014-11-01: qty 1

## 2014-11-01 MED ORDER — LORAZEPAM 1 MG PO TABS
1.0000 mg | ORAL_TABLET | Freq: Once | ORAL | Status: AC
  Administered 2014-11-01: 1 mg via ORAL
  Filled 2014-11-01: qty 1

## 2014-11-01 MED ORDER — AMLODIPINE BESYLATE 5 MG PO TABS
5.0000 mg | ORAL_TABLET | Freq: Once | ORAL | Status: AC
  Administered 2014-11-01: 5 mg via ORAL
  Filled 2014-11-01: qty 1

## 2014-11-01 NOTE — ED Notes (Signed)
PT has hx of PE's; coumadin levels checked and .07. States he had black stools but takes medicine for that and red when he wiped. Got transfusion on Thurs with dialysis. Has port R chest. Has access for peritoneal dialysis. Has started training for doing it at home.

## 2014-11-01 NOTE — ED Provider Notes (Signed)
CSN: [id number]     Arrival date & time 11/01/14  0747 History   First MD Initiated Contact with Patient 11/01/14 0756     Chief Complaint  Patient presents with  . Melena     (Consider location/radiation/quality/duration/timing/severity/associated sxs/prior Treatment) HPI Frank [name] is a 49 y.o. male with end-stage renal disease on hemodialysis, Tuesday Thursday Saturday, comes in for evaluation of dark/bloody stools. Patient states this morning at approximately 2:30 AM he had a bowel movement that was "very dark" and there was blood when he finished wiping. He reports having another bowel movement this morning at 6 AM and reports only blood on the toilet paper. He also reports he went to dialysis this morning where the nurse checked his INR and reported it as 0.7 and told him to come to the ED immediately before hemodialysis. She also reports his INR was checked on Saturday and was 0.9 and was increased on his Coumadin from 5 mg to 8 mg states he took 8 mg Saturday Sunday Monday, but has not taken it today. Reports that overall he feels well today and is not experiencing his typical chronic pain. Also denies headache, vision changes, chest pain, shortness of breath, abdominal pain. He does however report a "mild increase" in his pedal edema. No other aggravating or modifying factors.  Past Medical History  Diagnosis Date  . Hypertension   . Depression   . Complication of anesthesia     itching, sore throat  . Diabetes mellitus without complication     No history per patient, but remains under history as A1c would not be accurate given on dialysis  . Shortness of breath   . Anxiety   . ESRD (end stage renal disease)     due to HTN per patient, followed at [facility], s/p failed kidney transplant - dialysis Tue, Th, Sat  . Renal insufficiency   . CHF (congestive heart failure)   . Anemia    Past Surgical History  Procedure Laterality Date  . Kidney receipient  2006    failed and started  HD in March 2014  . Capd insertion    . Capd removal    . Left heart catheterization with coronary angiogram N/A 09/02/2014    Procedure: LEFT HEART CATHETERIZATION WITH CORONARY ANGIOGRAM;  Surgeon: [name], MD;  Location: [facility] CATH LAB;  Service: Cardiovascular;  Laterality: N/A;   No family history on file. History  Substance Use Topics  . Smoking status: Former Smoker -- 1.00 packs/day for 1 years    Types: Cigarettes  . Smokeless tobacco: Never Used     Comment: quit Jan 2014  . Alcohol Use: No    Review of Systems A 10 point review of systems was completed and was negative except for pertinent positives and negatives as mentioned in the history of present illness     Allergies  Ferrlecit and Darvocet  Home Medications   Prior to Admission medications   Medication Sig Start Date End Date Taking? Authorizing Provider  atorvastatin (LIPITOR) 40 MG tablet Take 1 tablet (40 mg total) by mouth daily at 6 PM. 09/02/14  Yes Barton Dubois, MD  cloNIDine (CATAPRES - DOSED IN MG/24 HR) 0.3 mg/24hr patch Place 1 patch onto the skin. Every 7 days 08/08/14  Yes Historical Provider, MD  doxercalciferol (HECTOROL) 4 MCG/2ML injection Inject 1.25 mLs (2.5 mcg total) into the vein Every Tuesday,Thursday,and Saturday with dialysis. 10/10/14  Yes [name], DO  levalbuterol (XOPENEX HFA) 45  MCG/ACT inhaler Inhale 2 puffs into the lungs every 4 (four) hours as needed for wheezing or shortness of breath. 10/02/14  Yes Allie Bossier, MD  lisinopril (PRINIVIL,ZESTRIL) 10 MG tablet Take 1 tablet (10 mg total) by mouth daily. 10/10/14  Yes Jessica U Vann, DO  amLODipine (NORVASC) 5 MG tablet Take 10 mg by mouth daily.    Historical Provider, MD  carvedilol (COREG) 25 MG tablet Take 1 tablet (25 mg total) by mouth 2 (two) times daily with a meal. 10/10/14   [name], DO  cinacalcet (SENSIPAR) 30 MG tablet Take 30 mg by mouth daily.    Historical Provider, MD  Ferric Citrate 210 MG TABS  Take 210 mg by mouth 3 (three) times daily with meals. Take 1 with each meal and 1 with a snack. 08/08/14   Historical Provider, MD  gentamicin cream (GARAMYCIN) 0.1 % Apply to [place] site 06/13/14   Historical Provider, MD  hydrALAZINE (APRESOLINE) 25 MG tablet Take 100 mg by mouth 3 (three) times daily.     Historical Provider, MD  hydrocortisone 1 % lotion Apply to site with itch as needed twice daily 06/13/14   Historical Provider, MD  LORazepam (ATIVAN) 0.5 MG tablet Take 1 tablet (0.5 mg total) by mouth at bedtime as needed for anxiety. 10/26/14   Lance Bosch, NP  methocarbamol (ROBAXIN) 500 MG tablet Take 2 tablets (1,000 mg total) by mouth every 8 (eight) hours as needed for muscle spasms. 10/21/14   [name], MD  minoxidil (LONITEN) 2.5 MG tablet Take 1 tablet (2.5 mg total) by mouth 2 (two) times daily. 10/10/14   [name], DO  omeprazole (PRILOSEC) 20 MG capsule Take 20 mg by mouth daily. 07/01/13   Historical Provider, MD  oxyCODONE (ROXICODONE) 5 MG immediate release tablet Take 1 tablet (5 mg total) by mouth every 4 (four) hours as needed for severe pain. 10/10/14   [name], DO  polyethylene glycol (MIRALAX) packet Take 17 g by mouth daily. For constipation Patient taking differently: Take 17 g by mouth daily as needed for mild constipation. For constipation 05/12/14   Domenic Polite, MD  predniSONE (DELTASONE) 10 MG tablet Take 10 mg by mouth daily with breakfast.    Historical Provider, MD  sevelamer carbonate (RENVELA) 800 MG tablet Take 1,600 mg by mouth 3 (three) times daily with meals. 12/27/13 12/27/14  Historical Provider, MD  sulfamethoxazole-trimethoprim (BACTRIM,SEPTRA) 400-80 MG per tablet Take 1 tablet by mouth every Monday, Wednesday, and Friday. 10/10/14   [name], DO  warfarin (COUMADIN) 2 MG tablet Take 1 tablet (2 mg total) by mouth daily. 10/10/14   Jessica U Vann, DO   BP 190/129 mmHg  Pulse 91  Temp(Src) 97.8 F (36.6 C) (Oral)  Resp 16   SpO2 100% Physical Exam  Constitutional: He is oriented to person, place, and time. He appears well-developed and well-nourished.  HENT:  Head: Normocephalic and atraumatic.  Mouth/Throat: Oropharynx is clear and moist.  Eyes: Conjunctivae are normal. Pupils are equal, round, and reactive to light. Right eye exhibits no discharge. Left eye exhibits no discharge. No scleral icterus.  Neck: Neck supple.  Cardiovascular: Normal rate, regular rhythm and normal heart sounds.   Pulmonary/Chest: Effort normal and breath sounds normal. No respiratory distress. He has no wheezes. He has no rales.  Abdominal: Soft. There is no tenderness.  Genitourinary: Guaiac negative stool.  Rectal exam: negative without mass, lesions or tenderness. No thrombosed or actively bleeding  hemorrhoids. No obvious fissures. No frank blood on exam  Musculoskeletal: He exhibits no tenderness.  No obvious pedal edema.  Neurological: He is alert and oriented to person, place, and time.  Cranial Nerves II-XII grossly intact  Skin: Skin is warm and dry. No rash noted.  Dry, scaly.  Psychiatric: He has a normal mood and affect.  Nursing note and vitals reviewed.   ED Course  Procedures (including critical care time) Labs Review Labs Reviewed  CBC WITH DIFFERENTIAL/PLATELET - Abnormal; Notable for the following:    RBC 3.05 (*)    Hemoglobin 8.4 (*)    HCT 25.9 (*)    RDW 17.9 (*)    All other components within normal limits  COMPREHENSIVE METABOLIC PANEL - Abnormal; Notable for the following:    BUN 64 (*)    Creatinine, Ser 11.51 (*)    Calcium 8.1 (*)    Albumin 2.7 (*)    AST 79 (*)    GFR calc non Af Amer 4 (*)    GFR calc Af Amer 5 (*)    All other components within normal limits  PROTIME-INR  POC OCCULT BLOOD, ED    Imaging Review Dg Chest 2 View  11/01/2014   CLINICAL DATA:  Weakness.  EXAM: CHEST  2 VIEW  COMPARISON:  October 07, 2014.  FINDINGS: Stable cardiomegaly. No pneumothorax or pleural  effusion is noted. Right internal jugular dialysis catheter is unchanged in position. No acute pulmonary disease is noted. Bony thorax is intact.  IMPRESSION: No acute cardiopulmonary abnormality seen.   Electronically Signed   By: Marijo Conception, M.D.   On: 11/01/2014 09:12     EKG Interpretation None       Meds given in ED:  Medications  LORazepam (ATIVAN) tablet 1 mg (1 mg Oral Given 11/01/14 0954)  hydrALAZINE (APRESOLINE) injection 10 mg (10 mg Intravenous Given 11/01/14 1042)  amLODipine (NORVASC) tablet 5 mg (5 mg Oral Given 11/01/14 1040)    New Prescriptions   No medications on file   Filed Vitals:   11/01/14 1021 11/01/14 1030 11/01/14 1100 11/01/14 1111  BP: 203/143 191/132 201/120 190/129  Pulse: 85  90 91  Temp:      TempSrc:      Resp: 16     SpO2: 95%  100% 100%     MDM  Vitals stable - afebrile. Patient with known hypertension. Has not taken blood pressure medicines yet today, will give in the ED. Pt resting comfortably in ED. PE--normal rectal exam. Physical exam is otherwise benign. Labwork--guaiac negative, hemoglobin and hematocrit appear to be stable and at baseline for patient. INR is 1.17. No hyperkalemia or evidence of acidosis.  Imaging--chest x-ray shows no acute cardio pulmonary pathology  DDX--patient here for presentation of melena and bloody stools. Exam today shows no evidence of GI bleed. We'll have patient follow-up with primary care for further management of INR. Encouraged patient to return to dialysis Center today to complete dialysis.  I discussed all relevant lab findings and imaging results with pt and they verbalized understanding. Discussed f/u with PCP within 48 hrs and return precautions, pt very amenable to plan. Prior to patient discharge, I discussed and reviewed this case with Dr. Wilson Singer    Final diagnoses:  ESRD on hemodialysis  Bloody stool      Comer Locket, PA-C 11/01/14 1115  Virgel Manifold, MD 11/02/14 [phone]

## 2014-11-01 NOTE — ED Notes (Signed)
[place] for pt to eat per [name], [place].

## 2014-11-01 NOTE — ED Notes (Signed)
Pt O2 sat 97% on RA. Pt requesting 2L [place] O2 for comfort.

## 2014-11-01 NOTE — Discharge Instructions (Signed)
Chronic Kidney Disease Chronic kidney disease occurs when the kidneys are damaged over a long period. The kidneys are two organs that lie on either side of the spine between the middle of the back and the front of the abdomen. The kidneys:   Remove wastes and extra water from the blood.   Produce important hormones. These help keep bones strong, regulate blood pressure, and help create red blood cells.   Balance the fluids and chemicals in the blood and tissues. A small amount of kidney damage may not cause problems, but a large amount of damage may make it difficult or impossible for the kidneys to work the way they should. If steps are not taken to slow down the kidney damage or stop it from getting worse, the kidneys may stop working permanently. Most of the time, chronic kidney disease does not go away. However, it can often be controlled, and those with the disease can usually live normal lives. CAUSES  The most common causes of chronic kidney disease are diabetes and high blood pressure (hypertension). Chronic kidney disease may also be caused by:   Diseases that cause the kidneys' filters to become inflamed.   Diseases that affect the immune system.   Genetic diseases.   Medicines that damage the kidneys, such as anti-inflammatory medicines.  Poisoning or exposure to toxic substances.   A reoccurring kidney or urinary infection.   A problem with urine flow. This may be caused by:   Cancer.   Kidney stones.   An enlarged prostate in males. SIGNS AND SYMPTOMS  Because the kidney damage in chronic kidney disease occurs slowly, symptoms develop slowly and may not be obvious until the kidney damage becomes severe. A person may have a kidney disease for years without showing any symptoms. Symptoms can include:   Swelling (edema) of the legs, ankles, or feet.   Tiredness (lethargy).   Nausea or vomiting.   Confusion.   Problems with urination, such as:    Decreased urine production.   Frequent urination, especially at night.   Frequent accidents in children who are potty trained.   Muscle twitches and cramps.   Shortness of breath.  Weakness.   Persistent itchiness.   Loss of appetite.  Metallic taste in the mouth.  Trouble sleeping.  Slowed development in children.  Short stature in children. DIAGNOSIS  Chronic kidney disease may be detected and diagnosed by tests, including blood, urine, imaging, or kidney biopsy tests.  TREATMENT  Most chronic kidney diseases cannot be cured. Treatment usually involves relieving symptoms and preventing or slowing the progression of the disease. Treatment may include:   A special diet. You may need to avoid alcohol and foods thatare salty and high in potassium.   Medicines. These may:   Lower blood pressure.   Relieve anemia.   Relieve swelling.   Protect the bones. HOME CARE INSTRUCTIONS   Follow your prescribed diet.   Take medicines only as directed by your health care provider. Do not take any new medicines (prescription, over-the-counter, or nutritional supplements) unless approved by your health care provider. Many medicines can worsen your kidney damage or need to have the dose adjusted.   Quit smoking if you smoke. Talk to your health care provider about a smoking cessation program.   Keep all follow-up visits as directed by your health care provider. SEEK IMMEDIATE MEDICAL CARE IF:  Your symptoms get worse or you develop new symptoms.   You develop symptoms of end-stage kidney disease. These  include:   Headaches.   Abnormally dark or light skin.   Numbness in the hands or feet.   Easy bruising.   Frequent hiccups.   Menstruation stops.   You have a fever.   You have decreased urine production.   You havepain or bleeding when urinating. MAKE SURE YOU:  Understand these instructions.  Will watch your condition.  Will  get help right away if you are not doing well or get worse. FOR MORE INFORMATION   American Association of Kidney Patients: BombTimer.gl  National Kidney Foundation: www.kidney.[place]: https://mathis.com/  [facility]: www.lifeoptions.org and www.kidneyschool.org Document Released: 04/16/2008 Document Revised: 11/22/2013 Document Reviewed: 03/06/2012 [facility] Patient Information 2015 [place], [place]. This information is not intended to replace advice given to you by your health care provider. Make sure you discuss any questions you have with your health care provider.  Dialysis Dialysis is a procedure that replaces some of the work healthy kidneys do. It is done when you lose about 85-90% of your kidney function. It may also be done earlier if your symptoms may be improved by dialysis. During dialysis, wastes, salt, and extra water are removed from the blood, and the levels of certain chemicals in the blood (such as potassium) are maintained. Dialysis is done in sessions. Dialysis sessions are continued until the kidneys get better. If the kidneys cannot get better, such as in end-stage kidney disease, dialysis is continued for life or until you receive a new kidney (kidney transplant). There are two types of dialysis: hemodialysis and peritoneal dialysis. WHAT IS HEMODIALYSIS?  Hemodialysis is a type of dialysis in which a machine called a dialyzer is used to filter the blood. Before beginning hemodialysis, you will have surgery to create a site where blood can be removed from the body and returned to the body (vascular access). There are three types of vascular accesses:  Arteriovenous fistula. To create this type of access, an artery is connected to a vein (usually in the arm). A fistula takes 1-6 months to develop after surgery. If it develops properly, it usually lasts longer than the other types of vascular accesses. It is also less likely to become  infected and cause blood clots.  Arteriovenous graft. To create this type of access, an artery and a vein in the arm are connected with a tube. A graft may be used within 2-3 weeks of surgery.  A venous catheter. To create this type of access, a thin, flexible tube (catheter) is placed in a large vein in your neck, chest, or groin. A catheter may be used right away. It is usually used as a temporary access when dialysis needs to begin immediately. During hemodialysis, blood leaves the body through your access. It travels through a tube to the dialyzer, where it is filtered. The blood then returns to your body through another tube. Hemodialysis is usually performed by a health care provider at a hospital or dialysis center three times a week. Visits last about 3-4 hours. It may also be performed with the help of another person at home with training.  WHAT IS PERITONEAL DIALYSIS? Peritoneal dialysis is a type of dialysis in which the thin lining of the abdomen (peritoneum) is used as a filter. Before beginning peritoneal dialysis, you will have surgery to place a catheter in your abdomen. The catheter will be used to transfer a fluid called dialysate to and from your abdomen. At the start of a session, your abdomen is filled  with dialysate. During the session, wastes, salt, and extra water in the blood pass through the peritoneum and into the dialysate. The dialysate is drained from the body at the end of the session. The process of filling and draining the dialysate is called an exchange. Exchanges are repeated until you have used up all the dialysate for the day. Peritoneal dialysis may be performed by you at home or at almost any other location. It is done every day. You may need up to five exchanges a day. The amount of time the dialysate is in your body between exchanges is called a dwell. The dwell depends on the number of exchanges needed and the characteristics of the peritoneum. It usually varies  from 1.5-3 hours. You may go about your day normally between exchanges. Alternately, the exchanges may be done at night while you sleep, using a machine called a cycler. WHICH TYPE OF DIALYSIS SHOULD I CHOOSE?  Both hemodialysis and peritoneal dialysis have advantages and disadvantages. Talk to your health care provider about which type of dialysis would be best for you. Your lifestyle and preferences should be considered along with your medical condition. In some cases, only one type of dialysis may be an option.  Advantages of hemodialysis  It is done less often than peritoneal dialysis.  Someone else can do the dialysis for you.  If you go to a dialysis center, your health care provider will be able to recognize any problems right away.  If you go to a dialysis center, you can interact with others who are having dialysis. This can provide you with emotional support. Disadvantages of hemodialysis  Hemodialysis may cause cramps and low blood pressure. It may leave you feeling tired on the days you have the treatment.  If you go to a dialysis center, you will need to make weekly appointments and work around the center's schedule.  You will need to take extra care when traveling. If you go to a dialysis center, you will need to make special arrangements to visit a dialysis center near your destination. If you are having treatments at home, you will need to take the dialyzer with you to your destination.  You will need to avoid more foods than you would need to avoid on peritoneal dialysis. Advantages of peritoneal dialysis  It is less likely than hemodialysis to cause cramps and low blood pressure.  You may do exchanges on your own wherever you are, including when you travel.  You do not need to avoid as many foods as you do on hemodialysis. Disadvantages of peritoneal dialysis  It is done more often than hemodialysis.  Performing peritoneal dialysis requires you to have dexterity of  the hands. You must also be able to lift bags.  You will have to learn sterilization techniques. You will need to practice them every day to reduce the risk of infection. WHAT CHANGES WILL I NEED TO MAKE TO MY DIET DURING DIALYSIS? Both hemodialysis and peritoneal dialysis require you to make some changes to your diet. For example, you will need to limit your intake of foods high in the minerals phosphorus and potassium. You will also need to limit your fluid intake. Your dietitian can help you plan meals. A good meal plan can improve your dialysis and your health.  WHAT SHOULD I EXPECT WHEN BEGINNING DIALYSIS? Adjusting to the dialysis treatment, schedule, and diet can take some time. You may need to stop working and may not be able to do some of  the things you normally do. You may feel anxious or depressed when beginning dialysis. Eventually, many people feel better overall because of dialysis. Some people are able to return to work after making some changes, such as reducing work intensity. WHERE CAN I FIND MORE INFORMATION?   [place]: www.kidney.org  American Association of Kidney Patients: BombTimer.gl  American Kidney Fund: www.kidneyfund.org Document Released: 09/28/2002 Document Revised: 11/22/2013 Document Reviewed: 09/01/2012 [facility] Patient Information 2015 [place], [place]. This information is not intended to replace advice given to you by your health care provider. Make sure you discuss any questions you have with your health care provider.   It is important for you to return to your dialysis Center today and complete her dialysis. He will also need to follow-up with her primary care for further evaluation and management of your symptoms. Return to ED for new or worsening symptoms.

## 2014-11-05 ENCOUNTER — Emergency Department (HOSPITAL_COMMUNITY): Payer: Medicare Other

## 2014-11-05 ENCOUNTER — Emergency Department (HOSPITAL_COMMUNITY)
Admission: EM | Admit: 2014-11-05 | Discharge: 2014-11-05 | Disposition: A | Discharge status: Home / Self Care | Payer: Medicare Other | Attending: Emergency Medicine | Admitting: Emergency Medicine

## 2014-11-05 ENCOUNTER — Encounter (HOSPITAL_COMMUNITY): Payer: Self-pay | Admitting: Emergency Medicine

## 2014-11-05 DIAGNOSIS — E119 Type 2 diabetes mellitus without complications: Secondary | ICD-10-CM | POA: Insufficient documentation

## 2014-11-05 DIAGNOSIS — N186 End stage renal disease: Secondary | ICD-10-CM | POA: Insufficient documentation

## 2014-11-05 DIAGNOSIS — I120 Hypertensive chronic kidney disease with stage 5 chronic kidney disease or end stage renal disease: Secondary | ICD-10-CM | POA: Insufficient documentation

## 2014-11-05 DIAGNOSIS — R1032 Left lower quadrant pain: Secondary | ICD-10-CM

## 2014-11-05 DIAGNOSIS — Z87891 Personal history of nicotine dependence: Secondary | ICD-10-CM | POA: Insufficient documentation

## 2014-11-05 DIAGNOSIS — R109 Unspecified abdominal pain: Secondary | ICD-10-CM

## 2014-11-05 DIAGNOSIS — F329 Major depressive disorder, single episode, unspecified: Secondary | ICD-10-CM | POA: Insufficient documentation

## 2014-11-05 DIAGNOSIS — I509 Heart failure, unspecified: Secondary | ICD-10-CM | POA: Diagnosis not present

## 2014-11-05 DIAGNOSIS — Z992 Dependence on renal dialysis: Secondary | ICD-10-CM | POA: Diagnosis not present

## 2014-11-05 DIAGNOSIS — Z5181 Encounter for therapeutic drug level monitoring: Secondary | ICD-10-CM

## 2014-11-05 DIAGNOSIS — D649 Anemia, unspecified: Secondary | ICD-10-CM | POA: Diagnosis not present

## 2014-11-05 DIAGNOSIS — Z79899 Other long term (current) drug therapy: Secondary | ICD-10-CM | POA: Diagnosis not present

## 2014-11-05 DIAGNOSIS — Z9889 Other specified postprocedural states: Secondary | ICD-10-CM | POA: Insufficient documentation

## 2014-11-05 DIAGNOSIS — I150 Renovascular hypertension: Secondary | ICD-10-CM

## 2014-11-05 DIAGNOSIS — Z9114 Patient's other noncompliance with medication regimen: Secondary | ICD-10-CM | POA: Diagnosis not present

## 2014-11-05 DIAGNOSIS — Z7901 Long term (current) use of anticoagulants: Secondary | ICD-10-CM | POA: Diagnosis not present

## 2014-11-05 DIAGNOSIS — F419 Anxiety disorder, unspecified: Secondary | ICD-10-CM | POA: Diagnosis not present

## 2014-11-05 DIAGNOSIS — Z7952 Long term (current) use of systemic steroids: Secondary | ICD-10-CM | POA: Diagnosis not present

## 2014-11-05 LAB — COMPREHENSIVE METABOLIC PANEL
ALT: 29 U/L (ref 0–53)
AST: 75 U/L — ABNORMAL HIGH (ref 0–37)
Albumin: 3.1 g/dL — ABNORMAL LOW (ref 3.5–5.2)
Alkaline Phosphatase: 113 U/L (ref 39–117)
Anion gap: 11 (ref 5–15)
BILIRUBIN TOTAL: 0.6 mg/dL (ref 0.3–1.2)
BUN: 48 mg/dL — AB (ref 6–23)
CO2: 26 mmol/L (ref 19–32)
Calcium: 7.9 mg/dL — ABNORMAL LOW (ref 8.4–10.5)
Chloride: 101 mmol/L (ref 96–112)
Creatinine, Ser: 9.75 mg/dL — ABNORMAL HIGH (ref 0.50–1.35)
GFR calc Af Amer: 6 mL/min — ABNORMAL LOW (ref >90)
GFR, EST NON AFRICAN AMERICAN: 5 mL/min — AB (ref >90)
GLUCOSE: 168 mg/dL — AB (ref 70–99)
Potassium: 4.8 mmol/L (ref 3.5–5.1)
Sodium: 138 mmol/L (ref 135–145)
Total Protein: 6.9 g/dL (ref 6.0–8.3)

## 2014-11-05 LAB — CBC WITH DIFFERENTIAL/PLATELET
Basophils Absolute: 0 10*3/uL (ref 0.0–0.1)
Basophils Relative: 0 % (ref 0–1)
EOS ABS: 0 10*3/uL (ref 0.0–0.7)
Eosinophils Relative: 0 % (ref 0–5)
HCT: 27.3 % — ABNORMAL LOW (ref 39.0–52.0)
HEMOGLOBIN: 8.6 g/dL — AB (ref 13.0–17.0)
LYMPHS PCT: 7 % — AB (ref 12–46)
Lymphs Abs: 0.6 10*3/uL — ABNORMAL LOW (ref 0.7–4.0)
MCH: 27.6 pg (ref 26.0–34.0)
MCHC: 31.5 g/dL (ref 30.0–36.0)
MCV: 87.5 fL (ref 78.0–100.0)
MONO ABS: 0.2 10*3/uL (ref 0.1–1.0)
Monocytes Relative: 3 % (ref 3–12)
Neutro Abs: 7.5 10*3/uL (ref 1.7–7.7)
Neutrophils Relative %: 90 % — ABNORMAL HIGH (ref 43–77)
PLATELETS: 161 10*3/uL (ref 150–400)
RBC: 3.12 MIL/uL — ABNORMAL LOW (ref 4.22–5.81)
RDW: 17.8 % — ABNORMAL HIGH (ref 11.5–15.5)
WBC: 8.3 10*3/uL (ref 4.0–10.5)

## 2014-11-05 LAB — LIPASE, BLOOD: Lipase: 34 U/L (ref 11–59)

## 2014-11-05 LAB — PROTIME-INR
INR: 1.08 (ref 0.00–1.49)
Prothrombin Time: 14.1 seconds (ref 11.6–15.2)

## 2014-11-05 MED ORDER — IOHEXOL 300 MG/ML  SOLN: 50.0000 mL | Freq: Once | INTRAMUSCULAR | Status: DC | PRN

## 2014-11-05 MED ORDER — ONDANSETRON HCL 4 MG/2ML IJ SOLN
4.0000 mg | Freq: Once | INTRAMUSCULAR | Status: AC
Start: 2014-11-05 — End: 2014-11-05
  Administered 2014-11-05: 4 mg via INTRAVENOUS
  Filled 2014-11-05: qty 2

## 2014-11-05 MED ORDER — HYDRALAZINE HCL 50 MG PO TABS
100.0000 mg | ORAL_TABLET | Freq: Once | ORAL | Status: AC
  Administered 2014-11-05: 100 mg via ORAL
  Filled 2014-11-05: qty 2

## 2014-11-05 MED ORDER — CLONIDINE HCL 0.1 MG PO TABS
0.2000 mg | ORAL_TABLET | Freq: Once | ORAL | Status: AC
  Administered 2014-11-05: 0.2 mg via ORAL
  Filled 2014-11-05: qty 2

## 2014-11-05 MED ORDER — HYDROMORPHONE HCL 2 MG/ML IJ SOLN
2.0000 mg | Freq: Once | INTRAMUSCULAR | Status: AC
  Administered 2014-11-05: 2 mg via INTRAMUSCULAR
  Filled 2014-11-05: qty 1

## 2014-11-05 MED ORDER — HYDROMORPHONE HCL 1 MG/ML IJ SOLN
1.0000 mg | Freq: Once | INTRAMUSCULAR | Status: AC
  Administered 2014-11-05: 1 mg via INTRAVENOUS
  Filled 2014-11-05: qty 1

## 2014-11-05 NOTE — ED Notes (Signed)
Pt report severe lower left abdominal pain starting at 9pm last night. Pt hx of kidney transplant 2 years ago. Pt states he is dialysis pt and had last treatment Thursday. Pt reports he does dialysis tx at home.

## 2014-11-05 NOTE — ED Notes (Signed)
IV RN returned IV request placement with call. Sts she is backed up and will be down as soon as possible to attempt IV start.

## 2014-11-05 NOTE — ED Notes (Signed)
Attempted IV start x2, was unsuccessful.  Another Rn in the dept will attempt

## 2014-11-05 NOTE — ED Notes (Signed)
Manuela Schwartz, Rn bedside attempting ultrasound IV start

## 2014-11-05 NOTE — ED Notes (Signed)
Certified Peritoneal Dialysis RN from [facility] en route to complete procedure to collect peritoneal fluid.  Tamala Julian, NP and Patty RN made aware. Plan of care discussed with pt.

## 2014-11-05 NOTE — ED Notes (Signed)
Dialysis nurse sts she is returning to [facility] to retrieve a piece of equipment and will return shortly. Primary RN made aware.

## 2014-11-05 NOTE — Discharge Instructions (Signed)
You need to follow up with your nephrologist and [place] as discussed. You need to make sure that you are taking your medications as directed Abdominal Pain Many things can cause belly (abdominal) pain. Most times, the belly pain is not dangerous. Many cases of belly pain can be watched and treated at home. HOME CARE   Do not take medicines that help you go poop (laxatives) unless told to by your doctor.  Only take medicine as told by your doctor.  Eat or drink as told by your doctor. Your doctor will tell you if you should be on a special diet. GET HELP IF:  You do not know what is causing your belly pain.  You have belly pain while you are sick to your stomach (nauseous) or have runny poop (diarrhea).  You have pain while you pee or poop.  Your belly pain wakes you up at night.  You have belly pain that gets worse or better when you eat.  You have belly pain that gets worse when you eat fatty foods.  You have a fever. GET HELP RIGHT AWAY IF:   The pain does not go away within 2 hours.  You keep throwing up (vomiting).  The pain changes and is only in the right or left part of the belly.  You have bloody or tarry looking poop. MAKE SURE YOU:   Understand these instructions.  Will watch your condition.  Will get help right away if you are not doing well or get worse. Document Released: 12/25/2007 Document Revised: 07/13/2013 Document Reviewed: 03/17/2013 [facility] Patient Information 2015 [place], [place]. This information is not intended to replace advice given to you by your health care provider. Make sure you discuss any questions you have with your health care provider.

## 2014-11-05 NOTE — ED Notes (Signed)
Lab collection delayed r/t port needs to be accessed

## 2014-11-05 NOTE — ED Notes (Signed)
Spoke to [place] in lab, Tax adviser is sending down blue top for Protime-INR test. cs

## 2014-11-05 NOTE — ED Provider Notes (Signed)
CSN: [id number]     Arrival date & time 11/05/14  0605 History   First MD Initiated Contact with Patient 11/05/14 0615     Chief Complaint  Patient presents with  . Abdominal Pain     (Consider location/radiation/quality/duration/timing/severity/associated sxs/prior Treatment) HPI Comments: 49 yo AA male with a history of HTN,  peritoneal and hemodialysis presenting with nonradiating  LLQ pain that started at 2100 last night.  Pain has been constant and described as an aching severe pain 10/10.  Pt is scheduled for hemodialysis today and normally scheduled Tues, Thurs, Sat.  Started PD on Monday and performs himself. No meds taken.  Denies fever, back pain, nausea, vomiting or diarrhea.  Never had pain like this in the past.  Pt does not produce urine.  The history is provided by the patient. No language interpreter was used.    Past Medical History  Diagnosis Date  . Hypertension   . Depression   . Complication of anesthesia     itching, sore throat  . Diabetes mellitus without complication     No history per patient, but remains under history as A1c would not be accurate given on dialysis  . Shortness of breath   . Anxiety   . ESRD (end stage renal disease)     due to HTN per patient, followed at [facility], s/p failed kidney transplant - dialysis Tue, Th, Sat  . Renal insufficiency   . CHF (congestive heart failure)   . Anemia    Past Surgical History  Procedure Laterality Date  . Kidney receipient  2006    failed and started HD in March 2014  . Capd insertion    . Capd removal    . Left heart catheterization with coronary angiogram N/A 09/02/2014    Procedure: LEFT HEART CATHETERIZATION WITH CORONARY ANGIOGRAM;  Surgeon: [name], MD;  Location: [facility] CATH LAB;  Service: Cardiovascular;  Laterality: N/A;   History reviewed. No pertinent family history. History  Substance Use Topics  . Smoking status: Former Smoker -- 1.00 packs/day for 1 years    Types: Cigarettes  .  Smokeless tobacco: Never Used     Comment: quit Jan 2014  . Alcohol Use: No    Review of Systems  Constitutional: Negative for fever, chills and diaphoresis.  All other systems reviewed and are negative.     Allergies  Ferrlecit and Darvocet  Home Medications   Prior to Admission medications   Medication Sig Start Date End Date Taking? Authorizing Provider  atorvastatin (LIPITOR) 40 MG tablet Take 1 tablet (40 mg total) by mouth daily at 6 PM. 09/02/14  Yes Barton Dubois, MD  warfarin (COUMADIN) 4 MG tablet Take 8 mg by mouth daily.   Yes Historical Provider, MD  amLODipine (NORVASC) 5 MG tablet Take 10 mg by mouth daily.    Historical Provider, MD  carvedilol (COREG) 25 MG tablet Take 1 tablet (25 mg total) by mouth 2 (two) times daily with a meal. 10/10/14   [name], DO  cinacalcet (SENSIPAR) 30 MG tablet Take 30 mg by mouth daily.    Historical Provider, MD  cloNIDine (CATAPRES - DOSED IN MG/24 HR) 0.3 mg/24hr patch Place 1 patch onto the skin. Every 7 days 08/08/14   Historical Provider, MD  doxercalciferol (HECTOROL) 4 MCG/2ML injection Inject 1.25 mLs (2.5 mcg total) into the vein Every Tuesday,Thursday,and Saturday with dialysis. 10/10/14   [name], DO  Ferric Citrate 210 MG TABS Take 210 mg  by mouth 3 (three) times daily with meals. Take 1 with each meal and 1 with a snack. 08/08/14   Historical Provider, MD  gentamicin cream (GARAMYCIN) 0.1 % Apply to [place] site 06/13/14   Historical Provider, MD  hydrALAZINE (APRESOLINE) 25 MG tablet Take 100 mg by mouth 3 (three) times daily.     Historical Provider, MD  hydrocortisone 1 % lotion Apply to site with itch as needed twice daily 06/13/14   Historical Provider, MD  levalbuterol [facility] HFA) 45 MCG/ACT inhaler Inhale 2 puffs into the lungs every 4 (four) hours as needed for wheezing or shortness of breath. 10/02/14   Allie Bossier, MD  lisinopril (PRINIVIL,ZESTRIL) 10 MG tablet Take 1 tablet (10 mg total) by mouth  daily. 10/10/14   [name], DO  LORazepam (ATIVAN) 0.5 MG tablet Take 1 tablet (0.5 mg total) by mouth at bedtime as needed for anxiety. 10/26/14   Lance Bosch, NP  methocarbamol (ROBAXIN) 500 MG tablet Take 2 tablets (1,000 mg total) by mouth every 8 (eight) hours as needed for muscle spasms. 10/21/14   [name], MD  minoxidil (LONITEN) 2.5 MG tablet Take 1 tablet (2.5 mg total) by mouth 2 (two) times daily. 10/10/14   [name], DO  omeprazole (PRILOSEC) 20 MG capsule Take 20 mg by mouth daily. 07/01/13   Historical Provider, MD  oxyCODONE (ROXICODONE) 5 MG immediate release tablet Take 1 tablet (5 mg total) by mouth every 4 (four) hours as needed for severe pain. 10/10/14   [name], DO  polyethylene glycol (MIRALAX) packet Take 17 g by mouth daily. For constipation Patient taking differently: Take 17 g by mouth daily as needed for mild constipation. For constipation 05/12/14   Domenic Polite, MD  predniSONE (DELTASONE) 10 MG tablet Take 10 mg by mouth daily with breakfast.    Historical Provider, MD  sevelamer carbonate (RENVELA) 800 MG tablet Take 1,600 mg by mouth 3 (three) times daily with meals. 12/27/13 12/27/14  Historical Provider, MD  sulfamethoxazole-trimethoprim (BACTRIM,SEPTRA) 400-80 MG per tablet Take 1 tablet by mouth every Monday, Wednesday, and Friday. 10/10/14   [name], DO  warfarin (COUMADIN) 2 MG tablet Take 1 tablet (2 mg total) by mouth daily. Patient not taking: Reported on 11/01/2014 10/10/14   [name], DO  warfarin (COUMADIN) 2 MG tablet Take 2 mg by mouth daily. Take with 5m tablet to equal 884m   Historical Provider, MD  warfarin (COUMADIN) 6 MG tablet Take 6 mg by mouth daily. Take with 61m73mablet to equal 8mg80m Historical Provider, MD   BP 185/120 mmHg  Pulse 79  Temp(Src)   Resp 20  SpO2 100% Physical Exam  Constitutional: He is oriented to person, place, and time. He appears distressed.  HENT:  Head: Normocephalic.  Eyes: EOM  are normal. Pupils are equal, round, and reactive to light.  Neck: Neck supple.  Cardiovascular: Normal rate, regular rhythm and normal heart sounds.   No murmur heard. Pulmonary/Chest: Breath sounds normal. No respiratory distress. He has no wheezes.  Abdominal: He exhibits distension. He exhibits no fluid wave and no ascites. There is no hepatosplenomegaly. There is tenderness in the left lower quadrant. There is rebound. There is no rigidity and no CVA tenderness.    Musculoskeletal: Normal range of motion.  Lymphadenopathy:    He has no cervical adenopathy.  Neurological: He is alert and oriented to person, place, and time. He has normal strength.  GCS eye subscore is 4. GCS verbal subscore is 5. GCS motor subscore is 6.  Skin: Skin is warm and intact.     Psychiatric: His speech is normal and behavior is normal. His mood appears anxious.    ED Course  Procedures (including critical care time) Labs Review Labs Reviewed  COMPREHENSIVE METABOLIC PANEL - Abnormal; Notable for the following:    Glucose, Bld 168 (*)    BUN 48 (*)    Creatinine, Ser 9.75 (*)    Calcium 7.9 (*)    Albumin 3.1 (*)    AST 75 (*)    GFR calc non Af Amer 5 (*)    GFR calc Af Amer 6 (*)    All other components within normal limits  CBC WITH DIFFERENTIAL/PLATELET - Abnormal; Notable for the following:    RBC 3.12 (*)    Hemoglobin 8.6 (*)    HCT 27.3 (*)    RDW 17.8 (*)    Neutrophils Relative % 90 (*)    Lymphocytes Relative 7 (*)    Lymphs Abs 0.6 (*)    All other components within normal limits  BODY FLUID CULTURE  LIPASE, BLOOD  PROTIME-INR  LACTATE DEHYDROGENASE, BODY FLUID  GLUCOSE, PERITONEAL FLUID  PROTEIN, BODY FLUID  ALBUMIN, FLUID  BODY FLUID CELL COUNT WITH DIFFERENTIAL    Imaging Review Ct Abdomen Pelvis Wo Contrast  11/05/2014   CLINICAL DATA:  Lower left abdominal pain for 1 day  EXAM: CT ABDOMEN AND PELVIS WITHOUT CONTRAST  TECHNIQUE: Multidetector CT imaging of the abdomen  and pelvis was performed following the standard protocol without IV contrast.  COMPARISON:  12/21/2013  FINDINGS: Lung bases are free of acute infiltrate or sizable effusion.  Cardiac shadow is enlarged. A small pericardial effusion is noted. The liver, gallbladder, spleen, adrenal glands and pancreas are within normal limits. The native kidneys are shrunken consistent with patient's given clinical history of end-stage renal disease. A renal transplant is noted within the left lower quadrant stable from the prior study.  Mild ascites is noted consistent with the peritoneal dialysis history. The dialysis catheter is noted low within the pelvis just above the bladder. The bladder is decompressed. A right inguinal hernia containing some dialysate fluid is noted. Diffuse aortoiliac calcifications are noted. No acute bony abnormality is noted.  IMPRESSION: Chronic changes consistent with the patient's given clinical history. No acute abnormality is noted.   Electronically Signed   By: Inez Catalina M.D.   On: 11/05/2014 10:37     EKG Interpretation   Date/Time:  Saturday November 05 2014 08:23:39 EDT Ventricular Rate:  87 PR Interval:  177 QRS Duration: 86 QT Interval:  425 QTC Calculation: 511 R Axis:   8 Text Interpretation:  Sinus rhythm Left ventricular hypertrophy  Nonspecific T abnormalities, lateral leads Prolonged QT interval No  significant change was found Confirmed by [place] ([id number]) on  11/05/2014 8:29:48 AM      MDM   Final diagnoses:  Left lower quadrant pain  Renovascular hypertension  Subtherapeutic anticoagulation  Non compliance w medication regimen    Pt feeling better at this time. Pt is medically non compliant. Dr. Wyvonnia Dusky involved in the care of the pt. Dialysis nurse stating that there is no fluid to be pulled off peritoneal dialysis. Pt is afebrile. Pt consistent with chronic unmanaged bp. Pt is to have peritoneal dialysis in 2 days. Pt missed hemodialysit  today. Pt pt is no hypoxic and labs k in normal  at this time. Discussed with pt the importance of follow up with his doctors at [place], NP 11/05/14 [place], MD 11/05/14 [phone]

## 2014-11-05 NOTE — ED Notes (Signed)
IV team RN bedside

## 2014-11-07 ENCOUNTER — Ambulatory Visit: Payer: Medicare Other | Admitting: Family Medicine

## 2014-11-14 ENCOUNTER — Ambulatory Visit: Payer: Medicare Other | Attending: Internal Medicine | Admitting: Internal Medicine

## 2014-11-14 ENCOUNTER — Encounter: Payer: Self-pay | Admitting: Internal Medicine

## 2014-11-14 VITALS — BP 153/96 | HR 67 | Temp 97.8°F | Resp 16 | Ht 73.5 in | Wt 192.0 lb

## 2014-11-14 DIAGNOSIS — N186 End stage renal disease: Secondary | ICD-10-CM | POA: Diagnosis not present

## 2014-11-14 DIAGNOSIS — Z992 Dependence on renal dialysis: Secondary | ICD-10-CM | POA: Insufficient documentation

## 2014-11-14 DIAGNOSIS — I2699 Other pulmonary embolism without acute cor pulmonale: Secondary | ICD-10-CM | POA: Diagnosis not present

## 2014-11-14 DIAGNOSIS — M25512 Pain in left shoulder: Secondary | ICD-10-CM | POA: Diagnosis not present

## 2014-11-14 DIAGNOSIS — I10 Essential (primary) hypertension: Secondary | ICD-10-CM | POA: Diagnosis not present

## 2014-11-14 DIAGNOSIS — Z87891 Personal history of nicotine dependence: Secondary | ICD-10-CM | POA: Insufficient documentation

## 2014-11-14 MED ORDER — DICLOFENAC SODIUM 1 % TD GEL: 2.0000 g | Freq: Four times a day (QID) | TRANSDERMAL | Status: DC

## 2014-11-14 NOTE — Progress Notes (Signed)
Pt is here following up on his HTN. Pt is requesting to hear back about his referrals. Pt has chronic pain in his left arm and shoulder.

## 2014-11-14 NOTE — Patient Instructions (Signed)
Hypertension Hypertension, commonly called high blood pressure, is when the force of blood pumping through your arteries is too strong. Your arteries are the blood vessels that carry blood from your heart throughout your body. A blood pressure reading consists of a higher number over a lower number, such as 110/72. The higher number (systolic) is the pressure inside your arteries when your heart pumps. The lower number (diastolic) is the pressure inside your arteries when your heart relaxes. Ideally you want your blood pressure below 120/80. Hypertension forces your heart to work harder to pump blood. Your arteries may become narrow or stiff. Having hypertension puts you at risk for heart disease, stroke, and other problems.  RISK FACTORS Some risk factors for high blood pressure are controllable. Others are not.  Risk factors you cannot control include:   Race. You may be at higher risk if you are African American.  Age. Risk increases with age.  Gender. Men are at higher risk than women before age 58 years. After age 23, women are at higher risk than men. Risk factors you can control include:  Not getting enough exercise or physical activity.  Being overweight.  Getting too much fat, sugar, calories, or salt in your diet.  Drinking too much alcohol. SIGNS AND SYMPTOMS Hypertension does not usually cause signs or symptoms. Extremely high blood pressure (hypertensive crisis) may cause headache, anxiety, shortness of breath, and nosebleed. DIAGNOSIS  To check if you have hypertension, your health care provider will measure your blood pressure while you are seated, with your arm held at the level of your heart. It should be measured at least twice using the same arm. Certain conditions can cause a difference in blood pressure between your right and left arms. A blood pressure reading that is higher than normal on one occasion does not mean that you need treatment. If one blood pressure reading  is high, ask your health care provider about having it checked again. TREATMENT  Treating high blood pressure includes making lifestyle changes and possibly taking medicine. Living a healthy lifestyle can help lower high blood pressure. You may need to change some of your habits. Lifestyle changes may include:  Following the DASH diet. This diet is high in fruits, vegetables, and whole grains. It is low in salt, red meat, and added sugars.  Getting at least 2 hours of brisk physical activity every week.  Losing weight if necessary.  Not smoking.  Limiting alcoholic beverages.  Learning ways to reduce stress. If lifestyle changes are not enough to get your blood pressure under control, your health care provider may prescribe medicine. You may need to take more than one. Work closely with your health care provider to understand the risks and benefits. HOME CARE INSTRUCTIONS  Have your blood pressure rechecked as directed by your health care provider.   Take medicines only as directed by your health care provider. Follow the directions carefully. Blood pressure medicines must be taken as prescribed. The medicine does not work as well when you skip doses. Skipping doses also puts you at risk for problems.   Do not smoke.   Monitor your blood pressure at home as directed by your health care provider. SEEK MEDICAL CARE IF:   You think you are having a reaction to medicines taken.  You have recurrent headaches or feel dizzy.  You have swelling in your ankles.  You have trouble with your vision. SEEK IMMEDIATE MEDICAL CARE IF:  You develop a severe headache or confusion.  You have unusual weakness, numbness, or feel faint.  You have severe chest or abdominal pain.  You vomit repeatedly.  You have trouble breathing. MAKE SURE YOU:   Understand these instructions.  Will watch your condition.  Will get help right away if you are not doing well or get worse. Document  Released: 07/08/2005 Document Revised: 11/22/2013 Document Reviewed: 04/30/2013 [facility] Patient Information 2015 [place], [place]. This information is not intended to replace advice given to you by your health care provider. Make sure you discuss any questions you have with your health care provider.

## 2014-11-14 NOTE — Progress Notes (Signed)
Patient ID: Frank [name], male   DOB: [birth year], 49 y.o.   MRN: [id number]   Frank [name], is a 49 y.o. male  QRF:[id number]  [id number]:[id number]  DOB - July 15, 1966  Chief Complaint  Patient presents with  . Follow-up        Subjective:   Frank [name] is a 49 y.o. male here today for a follow up visit. Patient has complex medical history patient with hypertension, diabetes mellitus, end-stage renal disease on hemodialysis, generalized anxiety and depression. Patient is here for follow-up of hypertension and also referral to pain clinic. Patient continued to have chronic pain in his left arm and shoulder. He is also requesting physical therapy. Patient has No headache, No chest pain, No abdominal pain - No Nausea, No new weakness tingling or numbness.  Problem  Left Shoulder Pain  Essential Hypertension, Malignant  Pulmonary Embolism    ALLERGIES: Allergies  Allergen Reactions  . Ferrlecit [Na Ferric Gluc Cplx In Sucrose] Shortness Of Breath, Swelling and Other (See Comments)    Swelling in throat  . Darvocet [Propoxyphene N-Acetaminophen] Hives    PAST MEDICAL HISTORY: Past Medical History  Diagnosis Date  . Hypertension   . Depression   . Complication of anesthesia     itching, sore throat  . Diabetes mellitus without complication     No history per patient, but remains under history as A1c would not be accurate given on dialysis  . Shortness of breath   . Anxiety   . ESRD (end stage renal disease)     due to HTN per patient, followed at [facility], s/p failed kidney transplant - dialysis Tue, Th, Sat  . Renal insufficiency   . CHF (congestive heart failure)   . Anemia     MEDICATIONS AT HOME: Prior to Admission medications   Medication Sig Start Date End Date Taking? Authorizing Provider  amLODipine (NORVASC) 5 MG tablet Take 10 mg by mouth daily.   Yes Historical Provider, MD  atorvastatin (LIPITOR) 40 MG tablet Take 1 tablet (40 mg total) by mouth daily at 6 PM. 09/02/14  Yes  Barton Dubois, MD  carvedilol (COREG) 25 MG tablet Take 1 tablet (25 mg total) by mouth 2 (two) times daily with a meal. 10/10/14  Yes [name], DO  cinacalcet (SENSIPAR) 30 MG tablet Take 30 mg by mouth daily.   Yes Historical Provider, MD  cloNIDine (CATAPRES - DOSED IN MG/24 HR) 0.3 mg/24hr patch Place 1 patch onto the skin. Every 7 days 08/08/14  Yes Historical Provider, MD  doxercalciferol (HECTOROL) 4 MCG/2ML injection Inject 1.25 mLs (2.5 mcg total) into the vein Every Tuesday,Thursday,and Saturday with dialysis. 10/10/14  Yes [name], DO  Ferric Citrate 210 MG TABS Take 210 mg by mouth 3 (three) times daily with meals. Take 1 with each meal and 1 with a snack. 08/08/14  Yes Historical Provider, MD  gentamicin cream (GARAMYCIN) 0.1 % Apply to [place] site 06/13/14  Yes Historical Provider, MD  hydrALAZINE (APRESOLINE) 25 MG tablet Take 100 mg by mouth 3 (three) times daily.    Yes Historical Provider, MD  hydrocortisone 1 % lotion Apply to site with itch as needed twice daily 06/13/14  Yes Historical Provider, MD  levalbuterol (XOPENEX HFA) 45 MCG/ACT inhaler Inhale 2 puffs into the lungs every 4 (four) hours as needed for wheezing or shortness of breath. 10/02/14  Yes Allie Bossier, MD  lisinopril (PRINIVIL,ZESTRIL) 10 MG tablet Take 1 tablet (10 mg total) by mouth daily.  10/10/14  Yes Jessica U Vann, DO  LORazepam (ATIVAN) 0.5 MG tablet Take 1 tablet (0.5 mg total) by mouth at bedtime as needed for anxiety. 10/26/14  Yes Lance Bosch, NP  methocarbamol (ROBAXIN) 500 MG tablet Take 2 tablets (1,000 mg total) by mouth every 8 (eight) hours as needed for muscle spasms. 10/21/14  Yes [name], MD  minoxidil (LONITEN) 2.5 MG tablet Take 1 tablet (2.5 mg total) by mouth 2 (two) times daily. 10/10/14  Yes [name], DO  omeprazole (PRILOSEC) 20 MG capsule Take 20 mg by mouth daily. 07/01/13  Yes Historical Provider, MD  oxyCODONE (ROXICODONE) 5 MG immediate release tablet Take 1  tablet (5 mg total) by mouth every 4 (four) hours as needed for severe pain. 10/10/14  Yes [name], DO  predniSONE (DELTASONE) 10 MG tablet Take 10 mg by mouth daily with breakfast.   Yes Historical Provider, MD  sevelamer carbonate (RENVELA) 800 MG tablet Take 1,600 mg by mouth 3 (three) times daily with meals. 12/27/13 12/27/14 Yes Historical Provider, MD  diclofenac sodium (VOLTAREN) 1 % GEL Apply 2 g topically 4 (four) times daily. 11/14/14   Tresa Garter, MD  polyethylene glycol (MIRALAX) packet Take 17 g by mouth daily. For constipation Patient not taking: Reported on 11/14/2014 05/12/14   Domenic Polite, MD  sulfamethoxazole-trimethoprim [facility]) 400-80 MG per tablet Take 1 tablet by mouth every Monday, Wednesday, and Friday. Patient not taking: Reported on 11/05/2014 10/10/14   [name], DO  warfarin (COUMADIN) 2 MG tablet Take 1 tablet (2 mg total) by mouth daily. Patient not taking: Reported on 11/01/2014 10/10/14   [name], DO  warfarin (COUMADIN) 2 MG tablet Take 2 mg by mouth daily. Take with 19m tablet to equal 826m   Historical Provider, MD  warfarin (COUMADIN) 4 MG tablet Take 8 mg by mouth daily.    Historical Provider, MD  warfarin (COUMADIN) 6 MG tablet Take 6 mg by mouth daily. Take with 110m21mablet to equal 8mg23m Historical Provider, MD     Objective:   Filed Vitals:   11/14/14 1039  BP: 153/96  Pulse: 67  Temp: 97.8 F (36.6 C)  TempSrc: Oral  Resp: 16  Height: 6' 1.5" (1.867 m)  Weight: 192 lb (87.091 kg)  SpO2: 97%    Exam General appearance : Awake, alert, not in any distress. Speech Clear. Not toxic looking HEENT: Atraumatic and Normocephalic, pupils equally reactive to light and accomodation Neck: supple, no JVD. No cervical lymphadenopathy.  Chest:Good air entry bilaterally, no added sounds  CVS: S1 S2 regular, no murmurs.  Abdomen: Bowel sounds present, Non tender and not distended with no gaurding, rigidity or  rebound. Extremities: B/L Lower Ext shows no edema, both legs are warm to touch Neurology: Awake alert, and oriented X 3, CN II-XII intact, Non focal Skin:No Rash  Data Review Lab Results  Component Value Date   HGBA1C 5.70 10/26/2014   HGBA1C 5.9* 10/17/2013   HGBA1C 5.5 04/13/2013     Assessment & Plan   1. ESRD on dialysis  - Ambulatory referral to Nephrology  2. Left shoulder pain  - Ambulatory referral to Pain Clinic - Ambulatory referral to Physical Therapy  3. Essential hypertension, malignant  We have discussed target BP range and blood pressure goal. I have advised patient to check BP regularly and to call us [place] or report to clinic if the numbers are consistently higher than 140/90. We  discussed the importance of compliance with medical therapy and DASH diet recommended, consequences of uncontrolled hypertension discussed.  - continue current BP medications  4. Pulmonary embolism Continue anticoagulation, regular follow-up  Patient have been counseled extensively about nutrition and exercise Return in about 4 weeks (around 12/12/2014), or if symptoms worsen or fail to improve, for Follow up HTN, Routine Follow Up.  The patient was given clear instructions to go to ER or return to medical center if symptoms don't improve, worsen or new problems develop. The patient verbalized understanding. The patient was told to call to get lab results if they haven't heard anything in the next week.   This note has been created with Surveyor, quantity. Any transcriptional errors are unintentional.    Angelica Chessman, MD, [place], [place], [place], [place] and [facility] [place], [place]   11/14/2014, 11:29 AM

## 2014-11-15 ENCOUNTER — Telehealth: Payer: Self-pay | Admitting: *Deleted

## 2014-11-15 NOTE — Telephone Encounter (Signed)
Beatrix Shipper calling to clarify the referral she got today from Dr. Doreene Burke.  She states that patient is already on dialysis and should have a nephrologist in [facility] that he is seeing.  Clarified with Dr. Doreene Burke that the patient would like to transfer to a dialysis center in [place] instead of [place] due to transportation issues.  Ms. Maryln Gottron said that the patient has an outstanding balance with their practice and that she would have to get approval from MD there.

## 2014-11-17 ENCOUNTER — Emergency Department (HOSPITAL_COMMUNITY): Payer: Medicare Other

## 2014-11-17 ENCOUNTER — Inpatient Hospital Stay (HOSPITAL_COMMUNITY)
Admission: EM | Admit: 2014-11-17 | Discharge: 2014-11-19 | DRG: 640 | Disposition: A | Discharge status: Home / Self Care | Payer: Medicare Other | Attending: Internal Medicine | Admitting: Internal Medicine

## 2014-11-17 ENCOUNTER — Encounter (HOSPITAL_COMMUNITY): Payer: Self-pay | Admitting: Emergency Medicine

## 2014-11-17 DIAGNOSIS — E875 Hyperkalemia: Secondary | ICD-10-CM | POA: Diagnosis present

## 2014-11-17 DIAGNOSIS — N189 Chronic kidney disease, unspecified: Secondary | ICD-10-CM

## 2014-11-17 DIAGNOSIS — M25512 Pain in left shoulder: Secondary | ICD-10-CM

## 2014-11-17 DIAGNOSIS — E119 Type 2 diabetes mellitus without complications: Secondary | ICD-10-CM | POA: Diagnosis present

## 2014-11-17 DIAGNOSIS — N186 End stage renal disease: Secondary | ICD-10-CM

## 2014-11-17 DIAGNOSIS — Z87891 Personal history of nicotine dependence: Secondary | ICD-10-CM

## 2014-11-17 DIAGNOSIS — Z9119 Patient's noncompliance with other medical treatment and regimen: Secondary | ICD-10-CM | POA: Diagnosis present

## 2014-11-17 DIAGNOSIS — R601 Generalized edema: Secondary | ICD-10-CM | POA: Diagnosis present

## 2014-11-17 DIAGNOSIS — Z992 Dependence on renal dialysis: Secondary | ICD-10-CM | POA: Diagnosis not present

## 2014-11-17 DIAGNOSIS — F329 Major depressive disorder, single episode, unspecified: Secondary | ICD-10-CM | POA: Diagnosis present

## 2014-11-17 DIAGNOSIS — E213 Hyperparathyroidism, unspecified: Secondary | ICD-10-CM | POA: Diagnosis present

## 2014-11-17 DIAGNOSIS — Z888 Allergy status to other drugs, medicaments and biological substances status: Secondary | ICD-10-CM | POA: Diagnosis not present

## 2014-11-17 DIAGNOSIS — F419 Anxiety disorder, unspecified: Secondary | ICD-10-CM | POA: Diagnosis present

## 2014-11-17 DIAGNOSIS — Z86711 Personal history of pulmonary embolism: Secondary | ICD-10-CM

## 2014-11-17 DIAGNOSIS — Z7901 Long term (current) use of anticoagulants: Secondary | ICD-10-CM

## 2014-11-17 DIAGNOSIS — D638 Anemia in other chronic diseases classified elsewhere: Secondary | ICD-10-CM | POA: Diagnosis present

## 2014-11-17 DIAGNOSIS — M25519 Pain in unspecified shoulder: Secondary | ICD-10-CM

## 2014-11-17 DIAGNOSIS — G479 Sleep disorder, unspecified: Secondary | ICD-10-CM

## 2014-11-17 DIAGNOSIS — D631 Anemia in chronic kidney disease: Secondary | ICD-10-CM | POA: Diagnosis present

## 2014-11-17 DIAGNOSIS — G8929 Other chronic pain: Secondary | ICD-10-CM | POA: Diagnosis present

## 2014-11-17 DIAGNOSIS — N2581 Secondary hyperparathyroidism of renal origin: Secondary | ICD-10-CM | POA: Diagnosis present

## 2014-11-17 DIAGNOSIS — I509 Heart failure, unspecified: Secondary | ICD-10-CM | POA: Diagnosis present

## 2014-11-17 DIAGNOSIS — I120 Hypertensive chronic kidney disease with stage 5 chronic kidney disease or end stage renal disease: Secondary | ICD-10-CM | POA: Diagnosis present

## 2014-11-17 DIAGNOSIS — E8779 Other fluid overload: Secondary | ICD-10-CM

## 2014-11-17 LAB — POCT I-STAT, CHEM 8
BUN: 54 mg/dL — ABNORMAL HIGH (ref 6–23)
CALCIUM ION: 1.09 mmol/L — AB (ref 1.12–1.23)
CHLORIDE: 100 mmol/L (ref 96–112)
Creatinine, Ser: 12 mg/dL — ABNORMAL HIGH (ref 0.50–1.35)
Glucose, Bld: 108 mg/dL — ABNORMAL HIGH (ref 70–99)
HEMATOCRIT: 25 % — AB (ref 39.0–52.0)
Hemoglobin: 8.5 g/dL — ABNORMAL LOW (ref 13.0–17.0)
Potassium: 4.2 mmol/L (ref 3.5–5.1)
Sodium: 139 mmol/L (ref 135–145)
TCO2: 21 mmol/L (ref 0–100)

## 2014-11-17 LAB — I-STAT CHEM 8, ED
BUN: 91 mg/dL — ABNORMAL HIGH (ref 6–23)
CALCIUM ION: 0.94 mmol/L — AB (ref 1.12–1.23)
Chloride: 103 mmol/L (ref 96–112)
Creatinine, Ser: >18 mg/dL — ABNORMAL HIGH (ref 0.50–1.35)
Glucose, Bld: 98 mg/dL (ref 70–99)
HCT: 26 % — ABNORMAL LOW (ref 39.0–52.0)
Hemoglobin: 8.8 g/dL — ABNORMAL LOW (ref 13.0–17.0)
Potassium: 7 mmol/L (ref 3.5–5.1)
SODIUM: 133 mmol/L — AB (ref 135–145)
TCO2: 16 mmol/L (ref 0–100)

## 2014-11-17 LAB — BASIC METABOLIC PANEL
ANION GAP: 17 — AB (ref 5–15)
BUN: 95 mg/dL — ABNORMAL HIGH (ref 6–23)
CALCIUM: 7.5 mg/dL — AB (ref 8.4–10.5)
CO2: 18 mmol/L — AB (ref 19–32)
CREATININE: 19.81 mg/dL — AB (ref 0.50–1.35)
Chloride: 99 mmol/L (ref 96–112)
GFR calc Af Amer: 3 mL/min — ABNORMAL LOW (ref >90)
GFR, EST NON AFRICAN AMERICAN: 2 mL/min — AB (ref >90)
Glucose, Bld: 100 mg/dL — ABNORMAL HIGH (ref 70–99)
Potassium: 7.2 mmol/L (ref 3.5–5.1)
SODIUM: 134 mmol/L — AB (ref 135–145)

## 2014-11-17 LAB — POTASSIUM: POTASSIUM: 6.5 mmol/L — AB (ref 3.5–5.1)

## 2014-11-17 LAB — CBC WITH DIFFERENTIAL/PLATELET
BASOS PCT: 0 % (ref 0–1)
Basophils Absolute: 0 10*3/uL (ref 0.0–0.1)
EOS ABS: 0 10*3/uL (ref 0.0–0.7)
Eosinophils Relative: 0 % (ref 0–5)
HCT: 24.9 % — ABNORMAL LOW (ref 39.0–52.0)
Hemoglobin: 8.2 g/dL — ABNORMAL LOW (ref 13.0–17.0)
Lymphocytes Relative: 8 % — ABNORMAL LOW (ref 12–46)
Lymphs Abs: 0.6 10*3/uL — ABNORMAL LOW (ref 0.7–4.0)
MCH: 27.3 pg (ref 26.0–34.0)
MCHC: 32.9 g/dL (ref 30.0–36.0)
MCV: 83 fL (ref 78.0–100.0)
Monocytes Absolute: 0.2 10*3/uL (ref 0.1–1.0)
Monocytes Relative: 3 % (ref 3–12)
NEUTROS ABS: 5.7 10*3/uL (ref 1.7–7.7)
NEUTROS PCT: 89 % — AB (ref 43–77)
Platelets: 206 10*3/uL (ref 150–400)
RBC: 3 MIL/uL — ABNORMAL LOW (ref 4.22–5.81)
RDW: 16.3 % — ABNORMAL HIGH (ref 11.5–15.5)
WBC: 6.5 10*3/uL (ref 4.0–10.5)

## 2014-11-17 LAB — BRAIN NATRIURETIC PEPTIDE: B Natriuretic Peptide: 2888.3 pg/mL — ABNORMAL HIGH (ref 0.0–100.0)

## 2014-11-17 LAB — PROTIME-INR
INR: 1.31 (ref 0.00–1.49)
PROTHROMBIN TIME: 16.4 s — AB (ref 11.6–15.2)

## 2014-11-17 LAB — MRSA PCR SCREENING: MRSA BY PCR: NEGATIVE

## 2014-11-17 MED ORDER — HYDROMORPHONE HCL 1 MG/ML IJ SOLN
0.5000 mg | INTRAMUSCULAR | Status: DC | PRN
  Administered 2014-11-18: 0.5 mg via INTRAVENOUS
  Administered 2014-11-18 – 2014-11-19 (×5): 1 mg via INTRAVENOUS
  Filled 2014-11-17 (×5): qty 1

## 2014-11-17 MED ORDER — OXYCODONE HCL 5 MG PO TABS
5.0000 mg | ORAL_TABLET | ORAL | Status: DC | PRN
  Administered 2014-11-18 (×5): 5 mg via ORAL
  Filled 2014-11-17 (×5): qty 1

## 2014-11-17 MED ORDER — SULFAMETHOXAZOLE-TRIMETHOPRIM 400-80 MG PO TABS: 1.0000 | ORAL_TABLET | ORAL | Status: DC

## 2014-11-17 MED ORDER — ONDANSETRON HCL 4 MG/2ML IJ SOLN
4.0000 mg | Freq: Four times a day (QID) | INTRAMUSCULAR | Status: DC | PRN
Start: 2014-11-17 — End: 2014-11-19
  Administered 2014-11-18: 4 mg via INTRAVENOUS
  Filled 2014-11-17: qty 2

## 2014-11-17 MED ORDER — SODIUM CHLORIDE 0.9 % IV SOLN
2.0000 g | Freq: Once | INTRAVENOUS | Status: AC
  Administered 2014-11-17: 2 g via INTRAVENOUS
  Filled 2014-11-17: qty 20

## 2014-11-17 MED ORDER — SODIUM CHLORIDE 0.9 % IV SOLN: 250.0000 mL | INTRAVENOUS | Status: DC | PRN

## 2014-11-17 MED ORDER — SEVELAMER CARBONATE 800 MG PO TABS
1600.0000 mg | ORAL_TABLET | Freq: Three times a day (TID) | ORAL | Status: DC
  Administered 2014-11-17 – 2014-11-19 (×3): 1600 mg via ORAL
  Filled 2014-11-17 (×9): qty 2

## 2014-11-17 MED ORDER — PREDNISONE 5 MG PO TABS
5.0000 mg | ORAL_TABLET | Freq: Every day | ORAL | Status: DC
  Administered 2014-11-17 – 2014-11-18 (×2): 5 mg via ORAL
  Filled 2014-11-17 (×5): qty 1

## 2014-11-17 MED ORDER — WARFARIN - PHARMACIST DOSING INPATIENT: Freq: Every day | Status: DC

## 2014-11-17 MED ORDER — ACETAMINOPHEN 500 MG PO TABS: 1000.0000 mg | ORAL_TABLET | Freq: Once | ORAL | Status: DC

## 2014-11-17 MED ORDER — ACETAMINOPHEN 325 MG PO TABS: 650.0000 mg | ORAL_TABLET | Freq: Four times a day (QID) | ORAL | Status: DC | PRN

## 2014-11-17 MED ORDER — SODIUM CHLORIDE 0.9 % IJ SOLN
3.0000 mL | Freq: Two times a day (BID) | INTRAMUSCULAR | Status: DC
  Administered 2014-11-17 – 2014-11-18 (×3): 3 mL via INTRAVENOUS

## 2014-11-17 MED ORDER — ALTEPLASE 2 MG IJ SOLR
2.0000 mg | Freq: Once | INTRAMUSCULAR | Status: DC | PRN
  Filled 2014-11-17: qty 2

## 2014-11-17 MED ORDER — ONDANSETRON HCL 4 MG/2ML IJ SOLN
4.0000 mg | Freq: Once | INTRAMUSCULAR | Status: AC
  Administered 2014-11-17: 4 mg via INTRAVENOUS
  Filled 2014-11-17: qty 2

## 2014-11-17 MED ORDER — ONDANSETRON HCL 4 MG PO TABS: 4.0000 mg | ORAL_TABLET | Freq: Four times a day (QID) | ORAL | Status: DC | PRN

## 2014-11-17 MED ORDER — MORPHINE SULFATE 4 MG/ML IJ SOLN
6.0000 mg | Freq: Once | INTRAMUSCULAR | Status: AC
  Administered 2014-11-17: 6 mg via INTRAVENOUS
  Filled 2014-11-17: qty 2

## 2014-11-17 MED ORDER — WARFARIN SODIUM 4 MG PO TABS: 8.0000 mg | ORAL_TABLET | Freq: Every day | ORAL | Status: DC

## 2014-11-17 MED ORDER — HEPARIN SODIUM (PORCINE) 1000 UNIT/ML DIALYSIS: 1000.0000 [IU] | INTRAMUSCULAR | Status: DC | PRN

## 2014-11-17 MED ORDER — ACETAMINOPHEN 650 MG RE SUPP
650.0000 mg | Freq: Four times a day (QID) | RECTAL | Status: DC | PRN
Start: 2014-11-17 — End: 2014-11-19

## 2014-11-17 MED ORDER — DEXTROSE 50 % IV SOLN
2.0000 | Freq: Once | INTRAVENOUS | Status: AC
  Administered 2014-11-17: 100 mL via INTRAVENOUS
  Filled 2014-11-17: qty 100

## 2014-11-17 MED ORDER — PENTAFLUOROPROP-TETRAFLUOROETH EX AERO: 1.0000 "application " | INHALATION_SPRAY | CUTANEOUS | Status: DC | PRN

## 2014-11-17 MED ORDER — CINACALCET HCL 30 MG PO TABS
30.0000 mg | ORAL_TABLET | Freq: Every day | ORAL | Status: DC
  Administered 2014-11-17 – 2014-11-18 (×2): 30 mg via ORAL
  Filled 2014-11-17 (×5): qty 1

## 2014-11-17 MED ORDER — HEPARIN SODIUM (PORCINE) 5000 UNIT/ML IJ SOLN
5000.0000 [IU] | Freq: Three times a day (TID) | INTRAMUSCULAR | Status: DC
  Administered 2014-11-17 – 2014-11-19 (×4): 5000 [IU] via SUBCUTANEOUS
  Filled 2014-11-17 (×10): qty 1

## 2014-11-17 MED ORDER — SODIUM CHLORIDE 0.9 % IV SOLN: 100.0000 mL | INTRAVENOUS | Status: DC | PRN

## 2014-11-17 MED ORDER — WARFARIN SODIUM 4 MG PO TABS
8.0000 mg | ORAL_TABLET | Freq: Once | ORAL | Status: AC
  Administered 2014-11-17: 8 mg via ORAL
  Filled 2014-11-17 (×2): qty 2

## 2014-11-17 MED ORDER — PANTOPRAZOLE SODIUM 40 MG PO TBEC
40.0000 mg | DELAYED_RELEASE_TABLET | Freq: Every day | ORAL | Status: DC
  Administered 2014-11-17 – 2014-11-18 (×2): 40 mg via ORAL
  Filled 2014-11-17 (×2): qty 1

## 2014-11-17 MED ORDER — SODIUM BICARBONATE 8.4 % IV SOLN
50.0000 meq | Freq: Once | INTRAVENOUS | Status: AC
  Administered 2014-11-17: 50 meq via INTRAVENOUS
  Filled 2014-11-17: qty 50

## 2014-11-17 MED ORDER — DARBEPOETIN ALFA 100 MCG/0.5ML IJ SOSY: 100.0000 ug | PREFILLED_SYRINGE | INTRAMUSCULAR | Status: DC

## 2014-11-17 MED ORDER — ATORVASTATIN CALCIUM 40 MG PO TABS
40.0000 mg | ORAL_TABLET | Freq: Every day | ORAL | Status: DC
  Administered 2014-11-17: 40 mg via ORAL
  Filled 2014-11-17 (×3): qty 1

## 2014-11-17 MED ORDER — DOXERCALCIFEROL 4 MCG/2ML IV SOLN
INTRAVENOUS | Status: AC
  Administered 2014-11-17: 14:00:00
  Filled 2014-11-17: qty 2

## 2014-11-17 MED ORDER — INSULIN ASPART 100 UNIT/ML IV SOLN
10.0000 [IU] | Freq: Once | INTRAVENOUS | Status: AC
  Administered 2014-11-17: 10 [IU] via INTRAVENOUS
  Filled 2014-11-17: qty 1

## 2014-11-17 MED ORDER — SODIUM CHLORIDE 0.9 % IV SOLN
100.0000 mL | INTRAVENOUS | Status: DC | PRN
Start: 2014-11-17 — End: 2014-11-17

## 2014-11-17 MED ORDER — SODIUM CHLORIDE 0.9 % IJ SOLN: 3.0000 mL | INTRAMUSCULAR | Status: DC | PRN

## 2014-11-17 MED ORDER — DOXERCALCIFEROL 4 MCG/2ML IV SOLN
2.5000 ug | INTRAVENOUS | Status: DC
Start: 2014-11-17 — End: 2014-11-19
  Filled 2014-11-17 (×3): qty 2

## 2014-11-17 MED ORDER — CARVEDILOL 25 MG PO TABS
25.0000 mg | ORAL_TABLET | Freq: Two times a day (BID) | ORAL | Status: DC
  Administered 2014-11-17 – 2014-11-18 (×3): 25 mg via ORAL
  Filled 2014-11-17 (×9): qty 1

## 2014-11-17 MED ORDER — LISINOPRIL 10 MG PO TABS
10.0000 mg | ORAL_TABLET | Freq: Every day | ORAL | Status: DC
  Filled 2014-11-17: qty 1

## 2014-11-17 MED ORDER — LIDOCAINE HCL (PF) 1 % IJ SOLN: 5.0000 mL | INTRAMUSCULAR | Status: DC | PRN

## 2014-11-17 MED ORDER — MINOXIDIL 2.5 MG PO TABS
2.5000 mg | ORAL_TABLET | Freq: Two times a day (BID) | ORAL | Status: DC
  Administered 2014-11-17 – 2014-11-18 (×3): 2.5 mg via ORAL
  Filled 2014-11-17 (×7): qty 1

## 2014-11-17 MED ORDER — HYDRALAZINE HCL 50 MG PO TABS
100.0000 mg | ORAL_TABLET | Freq: Three times a day (TID) | ORAL | Status: DC
  Administered 2014-11-17 – 2014-11-18 (×4): 100 mg via ORAL
  Filled 2014-11-17 (×10): qty 2

## 2014-11-17 MED ORDER — LIDOCAINE-PRILOCAINE 2.5-2.5 % EX CREA: 1.0000 "application " | TOPICAL_CREAM | CUTANEOUS | Status: DC | PRN

## 2014-11-17 NOTE — H&P (Signed)
[facility] Admission History and Physical       CHOSEN GESKE PIR:[id number] DOB: 08-07-1965 DOA: 11/17/2014  Referring physician: EDP Dr. Claudine Mouton PCP: Angelica Chessman, MD  Specialists:   Chief Complaint: Increasing Swelling  HPI: Frank [name] is a 49 y.o. male with a history of ESRD on Peritoneal dialysis, HTN who presents to the ED with complaints of increased swelling of his feet and legs, along with SOB and nauasea and vomiting x 2 days.  He reports that he has been awaiting Hemodialysis, and was advised to stop his peritoneal dialysis so he has been off of his peritoneal dialysis for the past 4 days.  He was transferred from the [facility] to [place].    In there ED, he was found to have  Hyperkalemia of 7.0 and was administered the short-term medications to reduce his potassium, and the ED Dr Claudine Mouton has called the nephrologist on Call for emergent dialysis.       Review of Systems:  Constitutional: No Weight Loss, No Weight Gain, Night Sweats, Fevers, Chills, Dizziness, Light Headedness, Fatigue, or Generalized Weakness HEENT: No Headaches, Difficulty Swallowing,Tooth/Dental Problems,Sore Throat,  No Sneezing, Rhinitis, Ear Ache, Nasal Congestion, or Post Nasal Drip,  Cardio-vascular:  No Chest pain, Orthopnea, PND, +Edema in Lower Extremities, +Anasarca, Dizziness, Palpitations  Resp: +Dyspnea, No DOE, No Productive Cough, No Non-Productive Cough, No Hemoptysis, No Wheezing.    GI: No Heartburn, Indigestion, Abdominal Pain, +Nausea, +Vomiting, Diarrhea, Constipation, Hematemesis, Hematochezia, Melena, Change in Bowel Habits,  Loss of Appetite  GU: No Dysuria, No Change in Color of Urine, No Urgency or Urinary Frequency, No Flank pain.  Musculoskeletal: No Joint Pain or Swelling, No Decreased Range of Motion, No Back Pain.  Neurologic: No Syncope, No Seizures, Muscle Weakness, Paresthesia, Vision Disturbance or Loss, No Diplopia, No Vertigo, No Difficulty  Walking,  Skin: No Rash or Lesions. Psych: No Change in Mood or Affect, No Depression or Anxiety, No Memory loss, No Confusion, or Hallucinations   Past Medical History  Diagnosis Date  . Hypertension   . Depression   . Complication of anesthesia     itching, sore throat  . Diabetes mellitus without complication     No history per patient, but remains under history as A1c would not be accurate given on dialysis  . Shortness of breath   . Anxiety   . ESRD (end stage renal disease)     due to HTN per patient, followed at [facility], s/p failed kidney transplant - dialysis Tue, Th, Sat  . Renal insufficiency   . CHF (congestive heart failure)   . Anemia      Past Surgical History  Procedure Laterality Date  . Kidney receipient  2006    failed and started HD in March 2014  . Capd insertion    . Capd removal    . Left heart catheterization with coronary angiogram N/A 09/02/2014    Procedure: LEFT HEART CATHETERIZATION WITH CORONARY ANGIOGRAM;  Surgeon: [name], MD;  Location: [facility] CATH LAB;  Service: Cardiovascular;  Laterality: N/A;      Prior to Admission medications   Medication Sig Start Date End Date Taking? Authorizing Provider  atorvastatin (LIPITOR) 40 MG tablet Take 1 tablet (40 mg total) by mouth daily at 6 PM. 09/02/14  Yes Barton Dubois, MD  carvedilol (COREG) 25 MG tablet Take 1 tablet (25 mg total) by mouth 2 (two) times daily with a meal. 10/10/14  Yes [name], DO  cinacalcet (SENSIPAR) 30 MG tablet Take 30 mg by mouth daily.   Yes Historical Provider, MD  diclofenac sodium (VOLTAREN) 1 % GEL Apply 2 g topically 4 (four) times daily. 11/14/14  Yes Tresa Garter, MD  doxercalciferol (HECTOROL) 4 MCG/2ML injection Inject 1.25 mLs (2.5 mcg total) into the vein Every Tuesday,Thursday,and Saturday with dialysis. 10/10/14  Yes [name], DO  hydrALAZINE (APRESOLINE) 25 MG tablet Take 100 mg by mouth 3 (three) times daily.    Yes Historical Provider, MD    lisinopril (PRINIVIL,ZESTRIL) 10 MG tablet Take 1 tablet (10 mg total) by mouth daily. 10/10/14  Yes [name], DO  minoxidil (LONITEN) 2.5 MG tablet Take 1 tablet (2.5 mg total) by mouth 2 (two) times daily. 10/10/14  Yes [name], DO  omeprazole (PRILOSEC) 20 MG capsule Take 20 mg by mouth daily. 07/01/13  Yes Historical Provider, MD  predniSONE (DELTASONE) 5 MG tablet Take 5 mg by mouth daily with breakfast.   Yes Historical Provider, MD  sevelamer carbonate (RENVELA) 800 MG tablet Take 1,600 mg by mouth 3 (three) times daily with meals. 12/27/13 12/27/14 Yes Historical Provider, MD  sulfamethoxazole-trimethoprim (BACTRIM,SEPTRA) 400-80 MG per tablet Take 1 tablet by mouth every Monday, Wednesday, and Friday. 10/10/14  Yes [name], DO  warfarin (COUMADIN) 4 MG tablet Take 8 mg by mouth daily.   Yes Historical Provider, MD  levalbuterol [facility] HFA) 45 MCG/ACT inhaler Inhale 2 puffs into the lungs every 4 (four) hours as needed for wheezing or shortness of breath. Patient not taking: Reported on 11/17/2014 10/02/14   Allie Bossier, MD  LORazepam (ATIVAN) 0.5 MG tablet Take 1 tablet (0.5 mg total) by mouth at bedtime as needed for anxiety. Patient not taking: Reported on 11/17/2014 10/26/14   Lance Bosch, NP  methocarbamol (ROBAXIN) 500 MG tablet Take 2 tablets (1,000 mg total) by mouth every 8 (eight) hours as needed for muscle spasms. Patient not taking: Reported on 11/17/2014 10/21/14   [name], MD  oxyCODONE (ROXICODONE) 5 MG immediate release tablet Take 1 tablet (5 mg total) by mouth every 4 (four) hours as needed for severe pain. Patient not taking: Reported on 11/17/2014 10/10/14   [name], DO  polyethylene glycol [facility]) packet Take 17 g by mouth daily. For constipation Patient not taking: Reported on 11/14/2014 05/12/14   Domenic Polite, MD  warfarin (COUMADIN) 2 MG tablet Take 1 tablet (2 mg total) by mouth daily. Patient not taking: Reported on 11/01/2014 10/10/14    [name], DO     Allergies  Allergen Reactions  . Ferrlecit [Na Ferric Gluc Cplx In Sucrose] Shortness Of Breath, Swelling and Other (See Comments)    Swelling in throat  . Darvocet [Propoxyphene N-Acetaminophen] Hives    Social History:  reports that he has quit smoking. His smoking use included Cigarettes. He has a 1 pack-year smoking history. He has never used smokeless tobacco. He reports that he does not drink alcohol or use illicit drugs.    No family history on file.     Physical Exam:  GEN:  Pleasant Obese 49 y.o.African American male examined and in no acute distress; cooperative with exam Filed Vitals:   11/17/14 0514 11/17/14 0521  BP: 156/94   Pulse: 62   Temp: 97.7 F (36.5 C)   TempSrc: Oral   Height:  _0  (1.854 m)  Weight:  89.359 kg (197 lb)  SpO2: 100%    Blood pressure 156/94,  pulse 62, temperature 97.7 F (36.5 C), temperature source Oral, height 6' 1" (1.854 m), weight 89.359 kg (197 lb), SpO2 100 %. PSYCH: He is alert and oriented x4; does not appear anxious does not appear depressed; affect is normal HEENT: Normocephalic and Atraumatic, Mucous membranes pink; PERRLA; EOM intact; Fundi:  Benign;  No scleral icterus, Nares: Patent, Oropharynx: Clear, Fair Dentition,    Neck:  FROM, No Cervical Lymphadenopathy nor Thyromegaly or Carotid Bruit; No JVD; Breasts:: Not examined CHEST WALL: No tenderness CHEST: Normal respiration, clear to auscultation bilaterally HEART: Regular rate and rhythm; no murmurs rubs or gallops BACK: No kyphosis or scoliosis; No CVA tenderness ABDOMEN: Positive Bowel Sounds, Obese, Soft Non-Tender, No Rebound or Guarding; No Masses, No Organomegaly Rectal Exam: Not done EXTREMITIES: No Cyanosis, Clubbing, or Edema; No Ulcerations. Genitalia: not examined PULSES: 2+ and symmetric SKIN: Normal hydration no rash or ulceration CNS:  Alert and Oriented x 4, No Focal Deficits Vascular: pulses palpable throughout    Labs  on Admission:  Basic Metabolic Panel:  Recent Labs Lab 11/17/14 0550  NA 133*  K 7.0*  CL 103  GLUCOSE 98  BUN 91*  CREATININE >18.00*   Liver Function Tests: No results for input(s): AST, ALT, ALKPHOS, BILITOT, PROT, ALBUMIN in the last 168 hours. No results for input(s): LIPASE, AMYLASE in the last 168 hours. No results for input(s): AMMONIA in the last 168 hours. CBC:  Recent Labs Lab 11/17/14 0543 11/17/14 0550  WBC 6.5  --   NEUTROABS 5.7  --   HGB 8.2* 8.8*  HCT 24.9* 26.0*  MCV 83.0  --   PLT 206  --    Cardiac Enzymes: No results for input(s): CKTOTAL, CKMB, CKMBINDEX, TROPONINI in the last 168 hours.  BNP (last 3 results)  Recent Labs  08/30/14 0103 09/12/14 0251 09/30/14 1556  BNP 1924.6* >4500.0* >4500.0*    ProBNP (last 3 results)  Recent Labs  01/02/14 0317 01/18/14 0048 04/15/14 0809  PROBNP >70000.0* >70000.0* >70000.0*    CBG: No results for input(s): GLUCAP in the last 168 hours.  Radiological Exams on Admission: Dg Shoulder Left  11/17/2014   CLINICAL DATA:  Chronic left shoulder pain.  Initial encounter.  EXAM: LEFT SHOULDER - 2+ VIEW  COMPARISON:  Left shoulder radiographs performed 11/28/2012  FINDINGS: There appears to be a mildly displaced osseous Bankart lesion, which may reflect interval dislocation, new from the prior study.  There is no evidence of dislocation at this time. The left humeral head is seated within the glenoid fossa. The acromioclavicular joint is unremarkable in appearance. No significant soft tissue abnormalities are seen. The visualized portions of the left lung are clear.  IMPRESSION: Apparent mildly displaced osseous Bankart lesion, which may reflect dislocation since 2014. No additional evidence for fracture. MRI could be considered to assess the extent of underlying labral injury.   Electronically Signed   By: Garald Balding M.D.   On: 11/17/2014 06:03     EKG: Independently reviewed. Normal Sinus Rhythm   Rate = 68   Assessment/Plan:   49 y.o. male with  Principal Problem:   1.   Hyperkalemia   Stepdown Monitoring   Administered IV CaCl , and IV Dextrose, and IV Insulin    And Kayexalate      Active Problems:   2.   ESRD on dialysis- Tues, Thurs, Sat in [place], nephro: [name]   EDP is contacting the Dialysis Team  3.   Anasarca   Due to Fluid Retention    Dialysis     3.   Secondary renal hyperparathyroidism   Continue Phoslo, and Cincalet      4.   Anemia of renal disease    Monitor Hb      5.   DVT Prophylaxis   SQ Heparin          Code Status:     FULL CODE        Family Communication:   No Family Present    Disposition Plan:    Inpatient  Status        Time spent: [facility] Pager [phone]   If Worth Please Contact the Day Rounding Team MD for [facility]  If 7PM-7AM, Please Contact Night-Floor Coverage  [email or web address] Password [facility] 11/17/2014, 6:55 AM     ADDENDUM:   Patient was seen and examined on 11/17/2014

## 2014-11-17 NOTE — Progress Notes (Signed)
ANTICOAGULATION CONSULT NOTE - Initial Consult  Pharmacy Consult for Warfarin Indication: pulmonary embolus - Hx of  Allergies  Allergen Reactions  . Ferrlecit [Na Ferric Gluc Cplx In Sucrose] Shortness Of Breath, Swelling and Other (See Comments)    Swelling in throat  . Darvocet [Propoxyphene N-Acetaminophen] Hives    Patient Measurements: Height: _0  (185.4 cm) Weight: 204 lb 9.4 oz (92.8 kg) IBW/kg (Calculated) : 79.9   Vital Signs: Temp: 98 F (36.7 C) (04/28 1900) Temp Source: Oral (04/28 1900) BP: 161/94 mmHg (04/28 2000) Pulse Rate: 84 (04/28 1900)  Labs:  Recent Labs  11/17/14 0543 11/17/14 0550 11/17/14 1457  HGB 8.2* 8.8* 8.5*  HCT 24.9* 26.0* 25.0*  PLT 206  --   --   LABPROT 16.4*  --   --   INR 1.31  --   --   CREATININE 19.81* >18.00* 12.00*    Estimated Creatinine Clearance: 8.3 mL/min (by C-G formula based on Cr of 12).   Medical History: Past Medical History  Diagnosis Date  . Hypertension   . Depression   . Complication of anesthesia     itching, sore throat  . Diabetes mellitus without complication     No history per patient, but remains under history as A1c would not be accurate given on dialysis  . Shortness of breath   . Anxiety   . ESRD (end stage renal disease)     due to HTN per patient, followed at [facility], s/p failed kidney transplant - dialysis Tue, Th, Sat  . Renal insufficiency   . CHF (congestive heart failure)   . Anemia      Assessment: 48ym with ESRD and home peritoneal dialysis admitted with hyperkalemia, N/V x 2 days.  He is supposed to br taking warfarin for Hx PE - at last hospitalization 3/16 he was restarted on warfarin but admits to non-compliance as outpatient.  Admit INR 1.3, CBC stable He was on bactrim as outpt which increases pt sensitivity to warfarin - now stopped Home Coumadin supposed to be 35m daily  - he states he takes 4437m- if he takes it at all.    Goal of Therapy:  INR 2-3 Monitor platelets  by anticoagulation protocol: Yes   Plan:  Coumadin 37m13m1 today Daily INR  LisBonnita Nasutiarm.D. CPP, BCPS Clinical Pharmacist [phone] [id number] 8:57 PM

## 2014-11-17 NOTE — ED Provider Notes (Signed)
CSN: [id number]     Arrival date & time 11/17/14  0509 History   First MD Initiated Contact with Patient 11/17/14 [phone]     Chief Complaint  Patient presents with  . Shoulder Pain  . Leg Swelling     (Consider location/radiation/quality/duration/timing/severity/associated sxs/prior Treatment) HPI  Patient is a 49yo male who presents after missing dialysis for 5 days.  He states he normally does home dialysis and is transitioning to different care.  He was told to stop his home dialysis beginning on Monday.  Patient states he also ran out of supplies.  Since then, patient has had worsening swelling in his legs, and believes there is fluid on his lungs.  Patient also complains of chronic L shoulder pain and states it may be related to his heart.  He denies CP or SOB at this time.  He has no further complaints.  10 Systems reviewed and are negative for acute change except as noted in the HPI.    Past Medical History  Diagnosis Date  . Hypertension   . Depression   . Complication of anesthesia     itching, sore throat  . Diabetes mellitus without complication     No history per patient, but remains under history as A1c would not be accurate given on dialysis  . Shortness of breath   . Anxiety   . ESRD (end stage renal disease)     due to HTN per patient, followed at [facility], s/p failed kidney transplant - dialysis Tue, Th, Sat  . Renal insufficiency   . CHF (congestive heart failure)   . Anemia    Past Surgical History  Procedure Laterality Date  . Kidney receipient  2006    failed and started HD in March 2014  . Capd insertion    . Capd removal    . Left heart catheterization with coronary angiogram N/A 09/02/2014    Procedure: LEFT HEART CATHETERIZATION WITH CORONARY ANGIOGRAM;  Surgeon: [name], MD;  Location: [facility] CATH LAB;  Service: Cardiovascular;  Laterality: N/A;   No family history on file. History  Substance Use Topics  . Smoking status: Former Smoker -- 1.00  packs/day for 1 years    Types: Cigarettes  . Smokeless tobacco: Never Used     Comment: quit Jan 2014  . Alcohol Use: No    Review of Systems    Allergies  Ferrlecit and Darvocet  Home Medications   Prior to Admission medications   Medication Sig Start Date End Date Taking? Authorizing Provider  atorvastatin (LIPITOR) 40 MG tablet Take 1 tablet (40 mg total) by mouth daily at 6 PM. 09/02/14  Yes Barton Dubois, MD  carvedilol (COREG) 25 MG tablet Take 1 tablet (25 mg total) by mouth 2 (two) times daily with a meal. 10/10/14  Yes [name], DO  cinacalcet (SENSIPAR) 30 MG tablet Take 30 mg by mouth daily.   Yes Historical Provider, MD  diclofenac sodium (VOLTAREN) 1 % GEL Apply 2 g topically 4 (four) times daily. 11/14/14  Yes Tresa Garter, MD  doxercalciferol (HECTOROL) 4 MCG/2ML injection Inject 1.25 mLs (2.5 mcg total) into the vein Every Tuesday,Thursday,and Saturday with dialysis. 10/10/14  Yes [name], DO  hydrALAZINE (APRESOLINE) 25 MG tablet Take 100 mg by mouth 3 (three) times daily.    Yes Historical Provider, MD  lisinopril (PRINIVIL,ZESTRIL) 10 MG tablet Take 1 tablet (10 mg total) by mouth daily. 10/10/14  Yes [name], DO  minoxidil (  LONITEN) 2.5 MG tablet Take 1 tablet (2.5 mg total) by mouth 2 (two) times daily. 10/10/14  Yes [name], DO  omeprazole (PRILOSEC) 20 MG capsule Take 20 mg by mouth daily. 07/01/13  Yes Historical Provider, MD  predniSONE (DELTASONE) 5 MG tablet Take 5 mg by mouth daily with breakfast.   Yes Historical Provider, MD  sevelamer carbonate (RENVELA) 800 MG tablet Take 1,600 mg by mouth 3 (three) times daily with meals. 12/27/13 12/27/14 Yes Historical Provider, MD  sulfamethoxazole-trimethoprim (BACTRIM,SEPTRA) 400-80 MG per tablet Take 1 tablet by mouth every Monday, Wednesday, and Friday. 10/10/14  Yes [name], DO  warfarin (COUMADIN) 4 MG tablet Take 8 mg by mouth daily.   Yes Historical Provider, MD  levalbuterol  [facility] HFA) 45 MCG/ACT inhaler Inhale 2 puffs into the lungs every 4 (four) hours as needed for wheezing or shortness of breath. Patient not taking: Reported on 11/17/2014 10/02/14   Allie Bossier, MD  LORazepam (ATIVAN) 0.5 MG tablet Take 1 tablet (0.5 mg total) by mouth at bedtime as needed for anxiety. Patient not taking: Reported on 11/17/2014 10/26/14   Lance Bosch, NP  methocarbamol (ROBAXIN) 500 MG tablet Take 2 tablets (1,000 mg total) by mouth every 8 (eight) hours as needed for muscle spasms. Patient not taking: Reported on 11/17/2014 10/21/14   [name], MD  oxyCODONE (ROXICODONE) 5 MG immediate release tablet Take 1 tablet (5 mg total) by mouth every 4 (four) hours as needed for severe pain. Patient not taking: Reported on 11/17/2014 10/10/14   [name], DO  polyethylene glycol [facility]) packet Take 17 g by mouth daily. For constipation Patient not taking: Reported on 11/14/2014 05/12/14   Domenic Polite, MD  warfarin (COUMADIN) 2 MG tablet Take 1 tablet (2 mg total) by mouth daily. Patient not taking: Reported on 11/01/2014 10/10/14   Tomi Bamberger Vann, DO   BP 156/94 mmHg  Pulse 62  Temp(Src) 97.7 F (36.5 C) (Oral)  Ht _0  (1.854 m)  Wt 197 lb (89.359 kg)  BMI 26.00 kg/m2  SpO2 100% Physical Exam  Constitutional: He is oriented to person, place, and time. Vital signs are normal. He appears well-developed and well-nourished.  Non-toxic appearance. He does not appear ill. No distress.  HENT:  Head: Normocephalic and atraumatic.  Nose: Nose normal.  Mouth/Throat: Oropharynx is clear and moist. No oropharyngeal exudate.  Eyes: Conjunctivae and EOM are normal. Pupils are equal, round, and reactive to light. No scleral icterus.  Neck: Normal range of motion. Neck supple. JVD present. No tracheal deviation, no edema, no erythema and normal range of motion present. No thyroid mass and no thyromegaly present.  R IJ Quinton catheter in place  Cardiovascular: Normal rate,  regular rhythm, S1 normal, S2 normal, normal heart sounds, intact distal pulses and normal pulses.  Exam reveals no gallop and no friction rub.   No murmur heard. Pulses:      Radial pulses are 2+ on the right side, and 2+ on the left side.       Dorsalis pedis pulses are 2+ on the right side, and 2+ on the left side.  Pulmonary/Chest: Effort normal and breath sounds normal. No respiratory distress. He has no wheezes. He has no rhonchi. He has no rales.  Abdominal: Soft. Normal appearance and bowel sounds are normal. He exhibits no distension, no ascites and no mass. There is no hepatosplenomegaly. There is no tenderness. There is no rebound, no guarding and no CVA  tenderness.  Musculoskeletal: Normal range of motion. He exhibits edema. He exhibits no tenderness.  Lymphadenopathy:    He has no cervical adenopathy.  Neurological: He is alert and oriented to person, place, and time. He has normal strength. No cranial nerve deficit or sensory deficit. He exhibits normal muscle tone.  Skin: Skin is warm, dry and intact. No petechiae and no rash noted. He is not diaphoretic. No erythema. No pallor.  Psychiatric: He has a normal mood and affect. His behavior is normal. Judgment normal.  Nursing note and vitals reviewed.   ED Course  Procedures (including critical care time) Labs Review Labs Reviewed  CBC WITH DIFFERENTIAL/PLATELET - Abnormal; Notable for the following:    RBC 3.00 (*)    Hemoglobin 8.2 (*)    HCT 24.9 (*)    RDW 16.3 (*)    Neutrophils Relative % 89 (*)    Lymphocytes Relative 8 (*)    Lymphs Abs 0.6 (*)    All other components within normal limits  BRAIN NATRIURETIC PEPTIDE - Abnormal; Notable for the following:    B Natriuretic Peptide 2888.3 (*)    All other components within normal limits  PROTIME-INR - Abnormal; Notable for the following:    Prothrombin Time 16.4 (*)    All other components within normal limits  BASIC METABOLIC PANEL - Abnormal; Notable for the  following:    Sodium 134 (*)    Potassium 7.2 (*)    CO2 18 (*)    Glucose, Bld 100 (*)    BUN 95 (*)    Creatinine, Ser 19.81 (*)    Calcium 7.5 (*)    GFR calc non Af Amer 2 (*)    GFR calc Af Amer 3 (*)    Anion gap 17 (*)    All other components within normal limits  POTASSIUM - Abnormal; Notable for the following:    Potassium 6.5 (*)    All other components within normal limits  I-STAT CHEM 8, ED - Abnormal; Notable for the following:    Sodium 133 (*)    Potassium 7.0 (*)    BUN 91 (*)    Creatinine, Ser >18.00 (*)    Calcium, Ion 0.94 (*)    Hemoglobin 8.8 (*)    HCT 26.0 (*)    All other components within normal limits  MRSA PCR SCREENING    Imaging Review Dg Shoulder Left  11/17/2014   CLINICAL DATA:  Chronic left shoulder pain.  Initial encounter.  EXAM: LEFT SHOULDER - 2+ VIEW  COMPARISON:  Left shoulder radiographs performed 11/28/2012  FINDINGS: There appears to be a mildly displaced osseous Bankart lesion, which may reflect interval dislocation, new from the prior study.  There is no evidence of dislocation at this time. The left humeral head is seated within the glenoid fossa. The acromioclavicular joint is unremarkable in appearance. No significant soft tissue abnormalities are seen. The visualized portions of the left lung are clear.  IMPRESSION: Apparent mildly displaced osseous Bankart lesion, which may reflect dislocation since 2014. No additional evidence for fracture. MRI could be considered to assess the extent of underlying labral injury.   Electronically Signed   By: Garald Balding M.D.   On: 11/17/2014 06:03     EKG Interpretation   Date/Time:  Thursday November 17 2014 06:06:44 EDT Ventricular Rate:  68 PR Interval:  184 QRS Duration: 96 QT Interval:  467 QTC Calculation: 497 R Axis:   -47 Text Interpretation:  Sinus rhythm Left  anterior fascicular block TWI is  now upright, peaked twave? Confirmed by Glynn Octave [phone]) on  11/17/2014  6:17:14 AM      MDM   Final diagnoses:  Hyperkalemia  Other hypervolemia    Patient presents to the ED after missing several sessions of dialysis.  EKG does not reveal any signs of hyperkalemia, although there are old TWI that are now upright, possible suggesting a rise in the T waves.  Potassium returned as 7.2.  IV access was immediately established in this patient, he was given sodium bicarb, insulin, and calcium for treatment.  Nephrology was consulted and will see the patient in the ED for dialysis treatment.  Patient was admitted to triad hospitalist for continued management to the step down unit.  Angiocath insertion Performed by: Everlene Balls  Consent: Verbal consent obtained. Risks and benefits: risks, benefits and alternatives were discussed Time out: Immediately prior to procedure a "time out" was called to verify the correct patient, procedure, equipment, support staff and site/side marked as required.  Preparation: Patient was prepped and draped in the usual sterile fashion.  Vein Location: L EJ vein  Gauge: 20G  Normal blood return and flush without difficulty Patient tolerance: Patient tolerated the procedure well with no immediate complications.     CRITICAL CARE Performed by: Everlene Balls   Total critical care time: 77mn hyperkalemia, hyponatremia  Critical care time was exclusive of separately billable procedures and treating other patients.  Critical care was necessary to treat or prevent imminent or life-threatening deterioration.  Critical care was time spent personally by me on the following activities: development of treatment plan with patient and/or surrogate as well as nursing, discussions with consultants, evaluation of patient's response to treatment, examination of patient, obtaining history from patient or surrogate, ordering and performing treatments and interventions, ordering and review of laboratory studies, ordering and review of radiographic  studies, pulse oximetry and re-evaluation of patient's condition.   AEverlene Balls MD 11/17/14 1510

## 2014-11-17 NOTE — Consult Note (Signed)
Frank [name] 11/17/2014 Rexene Agent Requesting Physician:  Arnoldo Morale MD  Reason for Consult:  Hyperkalemia, ESRD, Missed Dialysis HPI:  49 year old male in the request of Dr. Arnoldo Morale for the evaluation of hyperkalemia in a patient with end-stage renal disease. The patient is currently using peritoneal dialysis. Previously he was on hemodialysis after a failed kidney transplant of 8 years. The story was been going on lately for per him is very confusing. He tells me that he receives his home dialysis based out [place] [facility] [place] and was hoping to transfer to a [place] home therapies because of convenience with where he lives. That part is true, and he was to see me in the office for an evaluation in the upcoming future. He states that he was told that he should discontinue all dialysis because he would shortly start training and [place]. Therefore he has not done peritoneal dialysis in several days, perhaps as long as one week. I asked him why he didn't do peritoneal dialysis since he had a cycler and the bags available and he knew he needed it, I don't receive an understandable answer. I called his home therapies nurse manager who tells me that he has a long history of noncompliance and outpatient dialysis. He is able to perform peritoneal dialysis well but is never able to follow through on mandatory appointments. She tells me that he was on a compliance contract. Last Friday, he had a home visit with his current home therapies unit, and was not available even though he set the time and the date. This Monday he was to bring in mandatory labs and PD fluid, he was a no show. The nurse manager, Janace Hoard, tells me that he remains a patient at their unit and that they had not even sent over paperwork yet for transfer.  He has had a tunneled dialysis catheter for at least 2 years. When I ask why he does not have a fistula, I do not receive an understandable answer.   Filed Weights   11/17/14 0521 11/17/14 1000  Weight: 89.359 kg (197 lb) 91.2 kg (201 lb 1 oz)       ROS Balance of 12 systems is negative w/ exceptions as above  Outpt PD Orders He tells me he does CCPD, for 4 L exchanges overnight with a dry day  PMH  Past Medical History  Diagnosis Date  . Hypertension   . Depression   . Complication of anesthesia     itching, sore throat  . Diabetes mellitus without complication     No history per patient, but remains under history as A1c would not be accurate given on dialysis  . Shortness of breath   . Anxiety   . ESRD (end stage renal disease)     due to HTN per patient, followed at [facility], s/p failed kidney transplant - dialysis Tue, Th, Sat  . Renal insufficiency   . CHF (congestive heart failure)   . Anemia    PSH  Past Surgical History  Procedure Laterality Date  . Kidney receipient  2006    failed and started HD in March 2014  . Capd insertion    . Capd removal    . Left heart catheterization with coronary angiogram N/A 09/02/2014    Procedure: LEFT HEART CATHETERIZATION WITH CORONARY ANGIOGRAM;  Surgeon: [name], MD;  Location: [facility] CATH LAB;  Service: Cardiovascular;  Laterality: N/A;   FH No family history on file. SH  reports that  he has quit smoking. His smoking use included Cigarettes. He has a 1 pack-year smoking history. He has never used smokeless tobacco. He reports that he does not drink alcohol or use illicit drugs. Allergies  Allergies  Allergen Reactions  . Ferrlecit [Na Ferric Gluc Cplx In Sucrose] Shortness Of Breath, Swelling and Other (See Comments)    Swelling in throat  . Darvocet [Propoxyphene N-Acetaminophen] Hives   Home medications Prior to Admission medications   Medication Sig Start Date End Date Taking? Authorizing Provider  atorvastatin (LIPITOR) 40 MG tablet Take 1 tablet (40 mg total) by mouth daily at 6 PM. 09/02/14  Yes Barton Dubois, MD  carvedilol (COREG) 25 MG tablet Take 1 tablet (25 mg total) by  mouth 2 (two) times daily with a meal. 10/10/14  Yes [name], DO  cinacalcet (SENSIPAR) 30 MG tablet Take 30 mg by mouth daily.   Yes Historical Provider, MD  diclofenac sodium (VOLTAREN) 1 % GEL Apply 2 g topically 4 (four) times daily. 11/14/14  Yes Tresa Garter, MD  doxercalciferol (HECTOROL) 4 MCG/2ML injection Inject 1.25 mLs (2.5 mcg total) into the vein Every Tuesday,Thursday,and Saturday with dialysis. 10/10/14  Yes [name], DO  hydrALAZINE (APRESOLINE) 25 MG tablet Take 100 mg by mouth 3 (three) times daily.    Yes Historical Provider, MD  lisinopril (PRINIVIL,ZESTRIL) 10 MG tablet Take 1 tablet (10 mg total) by mouth daily. 10/10/14  Yes [name], DO  minoxidil (LONITEN) 2.5 MG tablet Take 1 tablet (2.5 mg total) by mouth 2 (two) times daily. 10/10/14  Yes [name], DO  omeprazole (PRILOSEC) 20 MG capsule Take 20 mg by mouth daily. 07/01/13  Yes Historical Provider, MD  predniSONE (DELTASONE) 5 MG tablet Take 5 mg by mouth daily with breakfast.   Yes Historical Provider, MD  sevelamer carbonate (RENVELA) 800 MG tablet Take 1,600 mg by mouth 3 (three) times daily with meals. 12/27/13 12/27/14 Yes Historical Provider, MD  sulfamethoxazole-trimethoprim (BACTRIM,SEPTRA) 400-80 MG per tablet Take 1 tablet by mouth every Monday, Wednesday, and Friday. 10/10/14  Yes [name], DO  warfarin (COUMADIN) 4 MG tablet Take 8 mg by mouth daily.   Yes Historical Provider, MD  levalbuterol [facility] HFA) 45 MCG/ACT inhaler Inhale 2 puffs into the lungs every 4 (four) hours as needed for wheezing or shortness of breath. Patient not taking: Reported on 11/17/2014 10/02/14   Allie Bossier, MD  LORazepam (ATIVAN) 0.5 MG tablet Take 1 tablet (0.5 mg total) by mouth at bedtime as needed for anxiety. Patient not taking: Reported on 11/17/2014 10/26/14   Lance Bosch, NP  methocarbamol (ROBAXIN) 500 MG tablet Take 2 tablets (1,000 mg total) by mouth every 8 (eight) hours as needed for  muscle spasms. Patient not taking: Reported on 11/17/2014 10/21/14   [name], MD  oxyCODONE (ROXICODONE) 5 MG immediate release tablet Take 1 tablet (5 mg total) by mouth every 4 (four) hours as needed for severe pain. Patient not taking: Reported on 11/17/2014 10/10/14   [name], DO  polyethylene glycol [facility]) packet Take 17 g by mouth daily. For constipation Patient not taking: Reported on 11/14/2014 05/12/14   Domenic Polite, MD  warfarin (COUMADIN) 2 MG tablet Take 1 tablet (2 mg total) by mouth daily. Patient not taking: Reported on 11/01/2014 10/10/14   [name], DO    Current Medications Scheduled Meds: . atorvastatin  40 mg Oral q1800  . carvedilol  25 mg Oral  BID WC  . cinacalcet  30 mg Oral Q breakfast  . doxercalciferol  2.5 mcg Intravenous Q T,Th,Sa-HD  . heparin  5,000 Units Subcutaneous 3 times per day  . hydrALAZINE  100 mg Oral TID  . minoxidil  2.5 mg Oral BID  . pantoprazole  40 mg Oral Daily  . predniSONE  5 mg Oral Q breakfast  . sevelamer carbonate  1,600 mg Oral TID WC  . sodium chloride  3 mL Intravenous Q12H  . [START ON 11/18/2014] sulfamethoxazole-trimethoprim  1 tablet Oral Q M,W,F   Continuous Infusions:  PRN Meds:.sodium chloride, sodium chloride, sodium chloride, acetaminophen **OR** acetaminophen, alteplase, heparin, heparin, HYDROmorphone (DILAUDID) injection, lidocaine (PF), lidocaine-prilocaine, ondansetron **OR** ondansetron (ZOFRAN) IV, oxyCODONE, pentafluoroprop-tetrafluoroeth, sodium chloride  CBC  Recent Labs Lab 11/17/14 0543 11/17/14 0550  WBC 6.5  --   NEUTROABS 5.7  --   HGB 8.2* 8.8*  HCT 24.9* 26.0*  MCV 83.0  --   PLT 206  --    Basic Metabolic Panel  Recent Labs Lab 11/17/14 0543 11/17/14 0550  NA 134* 133*  K 7.2* 7.0*  CL 99 103  CO2 18*  --   GLUCOSE 100* 98  BUN 95* 91*  CREATININE 19.81* >18.00*  CALCIUM 7.5*  --     Physical Exam  Blood pressure 182/108, pulse 73, temperature 98.5 F (36.9  C), temperature source Oral, resp. rate 14, height _0  (1.854 m), weight 91.2 kg (201 lb 1 oz), SpO2 95 %. GEN: awake, alert ENT: NCAT EYES: EOMI CV: RRR, no rub PULM: Clear bilaterally ABD: Soft, nontender, nondistended. PD catheter in right lower quadrant. Not bandaged. No purulence or erythema at exit site SKIN: No rashes or lesions EXT: 1-2+ edema   A/P 1. ESRD on PD at [facility] 1. Patient clearly has long-standing compliance problems 2. He will continue to receive kidney care [place] dialysis 3. If he can demonstrate a 3 month period of excellent compliance with his home therapy, I can reconsider if he could transfer to our local unit. 4. The goal ultrafiltration is 4.5 L today. 5. Likely will need another hemodialysis treatment tomorrow for further UF, then can transition to peritoneal dialysis 2. Hyperkalemia 1. Receiving Dialysis currently, using 1K bath for one hour followed by 2K bath 2. At this point I would stop his Bactrim and ACE inhibitor. 3. HTN/Vol: As above. Continue home blood pressure meds with the exception of lisinopril. 4. Anemia:  1. Provide Aranesp today 2. IV Fe allergy 3. Monitor 5. MBD: Cont cinacalcet and sevelamer  Pearson Grippe MD 11/17/2014, 12:34 PM

## 2014-11-17 NOTE — ED Notes (Signed)
Pt arrives from home with c/o R shoulder pain and bilateral leg swelling, upon admission he states "I got a dialysis problem." Pt states he usually does dialysis at home daily, missed it for the last two days "becuase I'm out of supplies." States he's twenty pounds more than ideal weight. States he's carrying fluid in bilateral legs and chest. VSS, lung sounds clear.

## 2014-11-17 NOTE — Progress Notes (Signed)
[facility] PROGRESS NOTE  COMER DEVINS ZDG:[id number] DOB: 1966-02-23 DOA: 11/17/2014 PCP: Angelica Chessman, MD  Assessment/Plan: 1. End-stage renal disease. -Patient presenting with uremic symptoms, Zantac with nausea vomiting as labs showing BUN of 95, bicarbonate 18, and potassium of 7.2. He had been off of peritoneal dialysis over the past 4 days for unclear reasons to me. He stated been instructed to discontinue dialysis. -Nephrology consulted as he underwent hemodialysis on 11/17/2014. -Labs postdialysis showed potassium of 4.2 with BUN of 54. -Plan for another treatment tomorrow after which will likely be transitioned to peritoneal dialysis.  2.  Hyperkalemia. -Patient presented with a potassium of 7.2 which likely resulted from stopping peritoneal dialysis. -His potassium improved to 4.2 after undergoing hemodialysis.  3.  Hypertension. -Patient on carvedilol 25 mg by mouth twice a day, minoxidil 2.5 mg by mouth twice a day and hydralazine 100 mg by mouth 3 times a day -Undergoing hemodialysis today, will continue monitoring blood pressures.  4.  Anemia of chronic disease. -Labs show a hemoglobin of 8.5, was given Aranesp therapy along with IV iron.   Code Status: Full code Family Communication: Family not present Disposition Plan: Patient undergoing hemodialysis today   Consultants:  Nephrology  Procedures:  Hemodialysis   HPI/Subjective: Patient is a pleasant 49 year old woman with a past medical history of end-stage renal disease on peritoneal dialysis who was admitted to the medicine service on 11/17/2014 when he presented with complaints of shortness of breath associate it with ache bilateral extremity swelling. So reported several episodes of nausea and vomiting. Patient stated that he had been off of his peritoneal dialysis over the past 4 days prior to hospitalization. Labs revealed a potassium of 7.2 with bicarbonate of 18. Nephrology was consulted as he  underwent hemodialysis on 11/17/2014. Follow-up labs revealed potassium of 4.2.  Objective: Filed Vitals:   11/17/14 1500  BP: 161/89  Pulse: 68  Temp:   Resp: 16   No intake or output data in the 24 hours ending 11/17/14 1623 Filed Weights   11/17/14 0521 11/17/14 1000 11/17/14 1300  Weight: 89.359 kg (197 lb) 91.2 kg (201 lb 1 oz) 92.8 kg (204 lb 9.4 oz)    Exam:   General: Patient is ill-appearing, in no acute distress, reports feeling sick  Cardiovascular: Regular rate and rhythm normal S1-S2  Respiratory: Bibasilar crackles, normal respiratory effort wheezing or rhonchi  Abdomen: Soft nontender nondistended positive bowel sounds  Musculoskeletal: Trace edema to lower extremity is bilaterally  Data Reviewed: Basic Metabolic Panel:  Recent Labs Lab 11/17/14 0543 11/17/14 0550 11/17/14 1100 11/17/14 1457  NA 134* 133*  --  139  K 7.2* 7.0* 6.5* 4.2  CL 99 103  --  100  CO2 18*  --   --   --   GLUCOSE 100* 98  --  108*  BUN 95* 91*  --  54*  CREATININE 19.81* >18.00*  --  12.00*  CALCIUM 7.5*  --   --   --    Liver Function Tests: No results for input(s): AST, ALT, ALKPHOS, BILITOT, PROT, ALBUMIN in the last 168 hours. No results for input(s): LIPASE, AMYLASE in the last 168 hours. No results for input(s): AMMONIA in the last 168 hours. CBC:  Recent Labs Lab 11/17/14 0543 11/17/14 0550 11/17/14 1457  WBC 6.5  --   --   NEUTROABS 5.7  --   --   HGB 8.2* 8.8* 8.5*  HCT 24.9* 26.0* 25.0*  MCV 83.0  --   --  PLT 206  --   --    Cardiac Enzymes: No results for input(s): CKTOTAL, CKMB, CKMBINDEX, TROPONINI in the last 168 hours. BNP (last 3 results)  Recent Labs  09/12/14 0251 09/30/14 1556 11/17/14 0543  BNP >4500.0* >4500.0* 2888.3*    ProBNP (last 3 results)  Recent Labs  01/02/14 0317 01/18/14 0048 04/15/14 0809  PROBNP >70000.0* >70000.0* >70000.0*    CBG: No results for input(s): GLUCAP in the last 168 hours.  Recent Results  (from the past 240 hour(s))  MRSA PCR Screening     Status: None   Collection Time: 11/17/14 10:07 AM  Result Value Ref Range Status   MRSA by PCR NEGATIVE NEGATIVE Final    Comment:        The GeneXpert MRSA Assay (FDA approved for NASAL specimens only), is one component of a comprehensive MRSA colonization surveillance program. It is not intended to diagnose MRSA infection nor to guide or monitor treatment for MRSA infections.      Studies: Dg Shoulder Left  11/17/2014   CLINICAL DATA:  Chronic left shoulder pain.  Initial encounter.  EXAM: LEFT SHOULDER - 2+ VIEW  COMPARISON:  Left shoulder radiographs performed 11/28/2012  FINDINGS: There appears to be a mildly displaced osseous Bankart lesion, which may reflect interval dislocation, new from the prior study.  There is no evidence of dislocation at this time. The left humeral head is seated within the glenoid fossa. The acromioclavicular joint is unremarkable in appearance. No significant soft tissue abnormalities are seen. The visualized portions of the left lung are clear.  IMPRESSION: Apparent mildly displaced osseous Bankart lesion, which may reflect dislocation since 2014. No additional evidence for fracture. MRI could be considered to assess the extent of underlying labral injury.   Electronically Signed   By: Garald Balding M.D.   On: 11/17/2014 06:03    Scheduled Meds: . atorvastatin  40 mg Oral q1800  . carvedilol  25 mg Oral BID WC  . cinacalcet  30 mg Oral Q breakfast  . [START ON 11/24/2014] darbepoetin (ARANESP) injection - DIALYSIS  100 mcg Intravenous Q Thu-HD  . doxercalciferol  2.5 mcg Intravenous Q T,Th,Sa-HD  . heparin  5,000 Units Subcutaneous 3 times per day  . hydrALAZINE  100 mg Oral TID  . minoxidil  2.5 mg Oral BID  . pantoprazole  40 mg Oral Daily  . predniSONE  5 mg Oral Q breakfast  . sevelamer carbonate  1,600 mg Oral TID WC  . sodium chloride  3 mL Intravenous Q12H   Continuous Infusions:    Principal Problem:   Hyperkalemia Active Problems:   ESRD on dialysis- Tues, Thurs, Sat in [employer], nephro: [name]   Secondary renal hyperparathyroidism   Anasarca   Anemia of renal disease    Time spent:     Kelvin Cellar  [facility] Pager [phone]. If 7PM-7AM, please contact night-coverage at [email or web address], password [facility] 11/17/2014, 4:23 PM  LOS: 0 days

## 2014-11-17 NOTE — Procedures (Signed)
I was present at this dialysis session. I have reviewed the session itself and made appropriate changes.   Pearson Grippe  MD 11/17/2014, 1:57 PM

## 2014-11-17 NOTE — Care Management Note (Signed)
Page 1 of 1   11/17/2014     12:03:05 PM CARE MANAGEMENT NOTE 11/17/2014  Patient:  Frank [name], Frank [name]   Account Number:  [id number]  Date Initiated:  11/17/2014  Documentation initiated by:  Elissa Hefty  Subjective/Objective Assessment:   adm w hyperkalemia, esrd on pertioneal dialysis     Action/Plan:   lives alone, pcp dr Jenetta Downer jegede   Anticipated DC Date:     Anticipated DC Plan:  [place]         Choice offered to / List presented to:             Status of service:   Medicare Important Message given?   (If response is "NO", the following Medicare IM given date fields will be blank) Date Medicare IM given:   Medicare IM given by:   Date Additional Medicare IM given:   Additional Medicare IM given by:    Discharge Disposition:    Per UR Regulation:  Reviewed for med. necessity/level of care/duration of stay  If discussed at [place] of Stay Meetings, dates discussed:    Comments:

## 2014-11-18 LAB — BASIC METABOLIC PANEL
Anion gap: 11 (ref 5–15)
BUN: 41 mg/dL — ABNORMAL HIGH (ref 6–23)
CO2: 25 mmol/L (ref 19–32)
Calcium: 7.4 mg/dL — ABNORMAL LOW (ref 8.4–10.5)
Chloride: 99 mmol/L (ref 96–112)
Creatinine, Ser: 11.73 mg/dL — ABNORMAL HIGH (ref 0.50–1.35)
GFR calc Af Amer: 5 mL/min — ABNORMAL LOW (ref >90)
GFR calc non Af Amer: 4 mL/min — ABNORMAL LOW (ref >90)
GLUCOSE: 91 mg/dL (ref 70–99)
POTASSIUM: 5 mmol/L (ref 3.5–5.1)
SODIUM: 135 mmol/L (ref 135–145)

## 2014-11-18 LAB — PROTIME-INR
INR: 1.22 (ref 0.00–1.49)
Prothrombin Time: 15.5 seconds — ABNORMAL HIGH (ref 11.6–15.2)

## 2014-11-18 LAB — RENAL FUNCTION PANEL
ALBUMIN: 2.2 g/dL — AB (ref 3.5–5.2)
Anion gap: 11 (ref 5–15)
BUN: 45 mg/dL — AB (ref 6–23)
CHLORIDE: 102 mmol/L (ref 96–112)
CO2: 25 mmol/L (ref 19–32)
CREATININE: 12.82 mg/dL — AB (ref 0.50–1.35)
Calcium: 7.5 mg/dL — ABNORMAL LOW (ref 8.4–10.5)
GFR calc Af Amer: 5 mL/min — ABNORMAL LOW (ref >90)
GFR calc non Af Amer: 4 mL/min — ABNORMAL LOW (ref >90)
Glucose, Bld: 94 mg/dL (ref 70–99)
PHOSPHORUS: 5.2 mg/dL — AB (ref 2.3–4.6)
POTASSIUM: 5.1 mmol/L (ref 3.5–5.1)
SODIUM: 138 mmol/L (ref 135–145)

## 2014-11-18 LAB — CBC
HCT: 24.3 % — ABNORMAL LOW (ref 39.0–52.0)
HEMATOCRIT: 23.7 % — AB (ref 39.0–52.0)
Hemoglobin: 7.7 g/dL — ABNORMAL LOW (ref 13.0–17.0)
Hemoglobin: 7.9 g/dL — ABNORMAL LOW (ref 13.0–17.0)
MCH: 27 pg (ref 26.0–34.0)
MCH: 27.3 pg (ref 26.0–34.0)
MCHC: 32.5 g/dL (ref 30.0–36.0)
MCHC: 32.5 g/dL (ref 30.0–36.0)
MCV: 83.2 fL (ref 78.0–100.0)
MCV: 84.1 fL (ref 78.0–100.0)
PLATELETS: 212 10*3/uL (ref 150–400)
Platelets: 190 10*3/uL (ref 150–400)
RBC: 2.85 MIL/uL — ABNORMAL LOW (ref 4.22–5.81)
RBC: 2.89 MIL/uL — ABNORMAL LOW (ref 4.22–5.81)
RDW: 16.2 % — ABNORMAL HIGH (ref 11.5–15.5)
RDW: 16 % — ABNORMAL HIGH (ref 11.5–15.5)
WBC: 6.3 10*3/uL (ref 4.0–10.5)
WBC: 5.7 10*3/uL (ref 4.0–10.5)

## 2014-11-18 MED ORDER — HYDROMORPHONE HCL 1 MG/ML IJ SOLN
INTRAMUSCULAR | Status: AC
  Filled 2014-11-18: qty 1

## 2014-11-18 MED ORDER — SODIUM CHLORIDE 0.9 % IV SOLN: 100.0000 mL | INTRAVENOUS | Status: DC | PRN

## 2014-11-18 MED ORDER — ALTEPLASE 2 MG IJ SOLR
2.0000 mg | Freq: Once | INTRAMUSCULAR | Status: AC | PRN
  Filled 2014-11-18: qty 2

## 2014-11-18 MED ORDER — HEPARIN SODIUM (PORCINE) 1000 UNIT/ML DIALYSIS
20.0000 [IU]/kg | INTRAMUSCULAR | Status: DC | PRN
  Filled 2014-11-18: qty 2

## 2014-11-18 MED ORDER — WARFARIN SODIUM 10 MG PO TABS
10.0000 mg | ORAL_TABLET | Freq: Once | ORAL | Status: AC
  Administered 2014-11-18: 10 mg via ORAL
  Filled 2014-11-18: qty 1

## 2014-11-18 MED ORDER — HYDRALAZINE HCL 20 MG/ML IJ SOLN
10.0000 mg | Freq: Once | INTRAMUSCULAR | Status: AC
  Administered 2014-11-18: 10 mg via INTRAVENOUS
  Filled 2014-11-18: qty 1

## 2014-11-18 MED ORDER — LIDOCAINE HCL (PF) 1 % IJ SOLN: 5.0000 mL | INTRAMUSCULAR | Status: DC | PRN

## 2014-11-18 MED ORDER — HEPARIN SODIUM (PORCINE) 1000 UNIT/ML DIALYSIS
1000.0000 [IU] | INTRAMUSCULAR | Status: DC | PRN
  Filled 2014-11-18: qty 1

## 2014-11-18 MED ORDER — NEPRO/CARBSTEADY PO LIQD
237.0000 mL | ORAL | Status: DC | PRN
  Filled 2014-11-18: qty 237

## 2014-11-18 MED ORDER — LIDOCAINE-PRILOCAINE 2.5-2.5 % EX CREA: 1.0000 "application " | TOPICAL_CREAM | CUTANEOUS | Status: DC | PRN

## 2014-11-18 MED ORDER — PENTAFLUOROPROP-TETRAFLUOROETH EX AERO: 1.0000 "application " | INHALATION_SPRAY | CUTANEOUS | Status: DC | PRN

## 2014-11-18 NOTE — Procedures (Signed)
I was present at this dialysis session. I have reviewed the session itself and made appropriate changes.   Pt doing well.  UF Goal 5L.  Adequate BP.  After Tx today pt safe to return home and resume PD with [facility].  He knows to resume PD upon discharge.    Pearson Grippe  MD 11/18/2014, 1:51 PM

## 2014-11-18 NOTE — Progress Notes (Signed)
Patient being transferred to dialysis via transport and ticket to ride was used. Report called to [facility] on 6E where patient will transfer to after dialysis.

## 2014-11-18 NOTE — Discharge Summary (Addendum)
Physician Discharge Summary   Patient ID: Frank [name] MRN: [id number] DOB/AGE: 1966-06-25 49 y.o.  Admit date: 11/17/2014 Discharge date: 11/18/2014  Primary Care Physician:  Angelica Chessman, MD  Discharge Diagnoses:      ESRD on peritoneal dialysis, noncompliant  . Hyperkalemia . Anasarca . Secondary renal hyperparathyroidism . Anemia of renal disease  Consults: Nephrology   Recommendations for Outpatient Follow-up:  Patient was recommended to be compliant with his peritoneal dialysis  He received hemodialysis prior to discharge, he was cleared by nephrology service and after treatment recommended to resume peritoneal dialysis with [facility] dialysis home therapies.  Please follow with the pain clinic    DIET: Renal diet  Allergies:   Allergies  Allergen Reactions  . Ferrlecit [Na Ferric Gluc Cplx In Sucrose] Shortness Of Breath, Swelling and Other (See Comments)    Swelling in throat  . Darvocet [Propoxyphene N-Acetaminophen] Hives     Discharge Medications:   Medication List    STOP taking these medications        levalbuterol 45 MCG/ACT inhaler  Commonly known as:  XOPENEX HFA     LORazepam 0.5 MG tablet  Commonly known as:  ATIVAN     methocarbamol 500 MG tablet  Commonly known as:  ROBAXIN-750     oxyCODONE 5 MG immediate release tablet  Commonly known as:  ROXICODONE     polyethylene glycol packet  Commonly known as:  MIRALAX     sulfamethoxazole-trimethoprim 400-80 MG per tablet  Commonly known as:  BACTRIM,SEPTRA      TAKE these medications        atorvastatin 40 MG tablet  Commonly known as:  LIPITOR  Take 1 tablet (40 mg total) by mouth daily at 6 PM.     carvedilol 25 MG tablet  Commonly known as:  COREG  Take 1 tablet (25 mg total) by mouth 2 (two) times daily with a meal.     cinacalcet 30 MG tablet  Commonly known as:  SENSIPAR  Take 30 mg by mouth daily.     diclofenac sodium 1 % Gel  Commonly known as:  VOLTAREN   Apply 2 g topically 4 (four) times daily.     doxercalciferol 4 MCG/2ML injection  Commonly known as:  HECTOROL  Inject 1.25 mLs (2.5 mcg total) into the vein Every Tuesday,Thursday,and Saturday with dialysis.     hydrALAZINE 25 MG tablet  Commonly known as:  APRESOLINE  Take 100 mg by mouth 3 (three) times daily.     lisinopril 10 MG tablet  Commonly known as:  PRINIVIL,ZESTRIL  Take 1 tablet (10 mg total) by mouth daily.     minoxidil 2.5 MG tablet  Commonly known as:  LONITEN  Take 1 tablet (2.5 mg total) by mouth 2 (two) times daily.     omeprazole 20 MG capsule  Commonly known as:  PRILOSEC  Take 20 mg by mouth daily.     predniSONE 5 MG tablet  Commonly known as:  DELTASONE  Take 5 mg by mouth daily with breakfast.     RENVELA 800 MG tablet  Generic drug:  sevelamer carbonate  Take 1,600 mg by mouth 3 (three) times daily with meals.     warfarin 4 MG tablet  Commonly known as:  COUMADIN  Take 8 mg by mouth daily.         Brief H and P: For complete details please refer to admission H and P, but in brief Frank [name] is a  49 y.o. male with a history of ESRD on Peritoneal dialysis, HTN who presents to the ED with complaints of increased swelling of his feet and legs, along with SOB and nauasea and vomiting x 2 days. He reported that he had been awaiting Hemodialysis, and was advised to stop his peritoneal dialysis so he has been off of his peritoneal dialysis for the past 4 days. He was transferred from the [facility] to [place]. In there ED, he was found to have Hyperkalemia of 7.0 and was administered the short-term medications to reduce his potassium, and the ED Dr Claudine Mouton has called the nephrologist on Call for emergent dialysis.   Hospital Course:  ESRD with hyperkalemia:  Patient was the peritoneal dialysis for more than 4 days for unclear reasons. nephrology was consulted and patient underwent hemodialysis on 4/28 and 4/29. Dr.  Joelyn Oms, with the nephrology service cleared the patient to be discharged home after the hemodialysis today and recommended to resume peritoneal dialysis with [facility] dialysis home therapies. Patient according to nephrology, Dr Joelyn Oms, patient knows resume peritoneal dialysis upon discharge. He has a history of recurrent noncompliance with dialysis.    Hyperkalemia. -Patient presented with a potassium of 7.2 which likely resulted from stopping peritoneal dialysis. Improved to 5.1 after hemodialysis, , received hemodialysis on 4/28 and 4/29   Hypertension. -Currently stable, continue Coreg, minoxidil, hydralazine   Anemia of chronic disease. -Hemoglobin down to 7.9 , given Aranesp therapy along with IV iron.   Day of Discharge BP 153/91 mmHg  Pulse 62  Temp(Src) 97.9 F (36.6 C) (Oral)  Resp 16  Ht _0  (1.854 m)  Wt 92.8 kg (204 lb 9.4 oz)  BMI 27.00 kg/m2  SpO2 94%  Physical Exam: General: Alert and awake oriented x3 not in any acute distress. HEENT: anicteric sclera, pupils reactive to light and accommodation CVS: S1-S2 clear no murmur rubs or gallops Chest: clear to auscultation bilaterally, no wheezing rales or rhonchi Abdomen: soft nontender, nondistended, normal bowel sounds Extremities: no cyanosis, clubbing or edema noted bilaterally Neuro: Cranial nerves II-XII intact, no focal neurological deficits   The results of significant diagnostics from this hospitalization (including imaging, microbiology, ancillary and laboratory) are listed below for reference.    LAB RESULTS: Basic Metabolic Panel:  Recent Labs Lab 11/18/14 0227 11/18/14 1001  NA 135 138  K 5.0 5.1  CL 99 102  CO2 25 25  GLUCOSE 91 94  BUN 41* 45*  CREATININE 11.73* 12.82*  CALCIUM 7.4* 7.5*  PHOS  --  5.2*   Liver Function Tests:  Recent Labs Lab 11/18/14 1001  ALBUMIN 2.2*   No results for input(s): LIPASE, AMYLASE in the last 168 hours. No results for input(s): AMMONIA in the  last 168 hours. CBC:  Recent Labs Lab 11/17/14 0543  11/18/14 0227 11/18/14 1001  WBC 6.5  --  6.3 5.7  NEUTROABS 5.7  --   --   --   HGB 8.2*  < > 7.7* 7.9*  HCT 24.9*  < > 23.7* 24.3*  MCV 83.0  --  83.2 84.1  PLT 206  --  212 190  < > = values in this interval not displayed. Cardiac Enzymes: No results for input(s): CKTOTAL, CKMB, CKMBINDEX, TROPONINI in the last 168 hours. BNP: Invalid input(s): POCBNP CBG: No results for input(s): GLUCAP in the last 168 hours.  Significant Diagnostic Studies:  Dg Shoulder Left  11/17/2014   CLINICAL DATA:  Chronic left shoulder pain.  Initial encounter.  EXAM: LEFT SHOULDER - 2+ VIEW  COMPARISON:  Left shoulder radiographs performed 11/28/2012  FINDINGS: There appears to be a mildly displaced osseous Bankart lesion, which may reflect interval dislocation, new from the prior study.  There is no evidence of dislocation at this time. The left humeral head is seated within the glenoid fossa. The acromioclavicular joint is unremarkable in appearance. No significant soft tissue abnormalities are seen. The visualized portions of the left lung are clear.  IMPRESSION: Apparent mildly displaced osseous Bankart lesion, which may reflect dislocation since 2014. No additional evidence for fracture. MRI could be considered to assess the extent of underlying labral injury.   Electronically Signed   By: Garald Balding M.D.   On: 11/17/2014 06:03    2D ECHO:   Disposition and Follow-up:     Discharge Instructions    Diet - low sodium heart healthy    Complete by:  As directed      Increase activity slowly    Complete by:  As directed             DISPOSITION:Home  DISCHARGE FOLLOW-UP Follow-up Information    Follow up with Angelica Chessman, MD. Schedule an appointment as soon as possible for a visit in 10 days.   Specialty:  Internal Medicine   Why:  for hospital follow-up   Contact information:   [place]  [phone]        Time spent on Discharge: 25 mins   Signed:   Kandon Hosking M.D. [facility] 11/18/2014, 4:48 PM Pager: [phone]

## 2014-11-18 NOTE — Progress Notes (Signed)
PHARMACY NOTE  Pharmacy Consult :  49 y.o. male is currently on chronic Coumadin for history of pulmonary embolism    Dosing Wt :  93 kg  LABS :  Recent Labs  11/17/14 0543 11/17/14 0550 11/17/14 1457 11/18/14 0227 11/18/14 1001  HGB 8.2* 8.8* 8.5* 7.7* 7.9*  HCT 24.9* 26.0* 25.0* 23.7* 24.3*  PLT 206  --   --  212 190  LABPROT 16.4*  --   --  15.5*  --   INR 1.31  --   --  1.22  --   CREATININE 19.81* >18.00* 12.00* 11.73* 12.82*    MEDICATION: Medication PTA: Prescriptions prior to admission  Medication Sig Dispense Refill Last Dose  . atorvastatin (LIPITOR) 40 MG tablet Take 1 tablet (40 mg total) by mouth daily at 6 PM. 30 tablet 1 11/16/2014 at Unknown time  . carvedilol (COREG) 25 MG tablet Take 1 tablet (25 mg total) by mouth 2 (two) times daily with a meal.   11/16/2014 at 1800  . cinacalcet (SENSIPAR) 30 MG tablet Take 30 mg by mouth daily.   11/16/2014 at Unknown time  . diclofenac sodium (VOLTAREN) 1 % GEL Apply 2 g topically 4 (four) times daily. 2 Tube 2 Past Week at Unknown time  . doxercalciferol (HECTOROL) 4 MCG/2ML injection Inject 1.25 mLs (2.5 mcg total) into the vein Every Tuesday,Thursday,and Saturday with dialysis. 2 mL  Past Week at Unknown time  . hydrALAZINE (APRESOLINE) 25 MG tablet Take 100 mg by mouth 3 (three) times daily.    11/16/2014 at Unknown time  . lisinopril (PRINIVIL,ZESTRIL) 10 MG tablet Take 1 tablet (10 mg total) by mouth daily. 30 tablet 0 11/16/2014 at Unknown time  . minoxidil (LONITEN) 2.5 MG tablet Take 1 tablet (2.5 mg total) by mouth 2 (two) times daily. 60 tablet 0 11/16/2014 at Unknown time  . omeprazole (PRILOSEC) 20 MG capsule Take 20 mg by mouth daily.   11/16/2014 at Unknown time  . predniSONE (DELTASONE) 5 MG tablet Take 5 mg by mouth daily with breakfast.   11/16/2014 at Unknown time  . sevelamer carbonate (RENVELA) 800 MG tablet Take 1,600 mg by mouth 3 (three) times daily with meals.   11/16/2014 at  Unknown time  . sulfamethoxazole-trimethoprim (BACTRIM,SEPTRA) 400-80 MG per tablet Take 1 tablet by mouth every Monday, Wednesday, and Friday. 12 tablet 0 11/16/2014 at Unknown time  . warfarin (COUMADIN) 4 MG tablet Take 8 mg by mouth daily.   Past Week at Unknown time  . levalbuterol (XOPENEX HFA) 45 MCG/ACT inhaler Inhale 2 puffs into the lungs every 4 (four) hours as needed for wheezing or shortness of breath. (Patient not taking: Reported on 11/17/2014) 1 Inhaler 0 Not Taking at Unknown time  . LORazepam (ATIVAN) 0.5 MG tablet Take 1 tablet (0.5 mg total) by mouth at bedtime as needed for anxiety. (Patient not taking: Reported on 11/17/2014) 15 tablet 0 Not Taking at Unknown time  . methocarbamol (ROBAXIN) 500 MG tablet Take 2 tablets (1,000 mg total) by mouth every 8 (eight) hours as needed for muscle spasms. (Patient not taking: Reported on 11/17/2014) 30 tablet 0 Not Taking at Unknown time  . oxyCODONE (ROXICODONE) 5 MG immediate release tablet Take 1 tablet (5 mg total) by mouth every 4 (four) hours as needed for severe pain. (Patient not taking: Reported on 11/17/2014) 10 tablet 0 Not Taking at Unknown time  . polyethylene glycol (MIRALAX) packet Take 17 g by mouth daily. For constipation (Patient not taking: Reported  on 11/14/2014) 14 each 0 Not Taking at Unknown time  . warfarin (COUMADIN) 2 MG tablet Take 1 tablet (2 mg total) by mouth daily. (Patient not taking: Reported on 11/01/2014) 15 tablet 0 Not Taking at Unknown time   Scheduled:  Scheduled:  . atorvastatin  40 mg Oral q1800  . carvedilol  25 mg Oral BID WC  . cinacalcet  30 mg Oral Q breakfast  . [START ON 11/24/2014] darbepoetin (ARANESP) injection - DIALYSIS  100 mcg Intravenous Q Thu-HD  . doxercalciferol  2.5 mcg Intravenous Q T,Th,Sa-HD  . heparin  5,000 Units Subcutaneous 3 times per day  . hydrALAZINE  100 mg Oral TID  . minoxidil  2.5 mg Oral BID  . pantoprazole  40 mg Oral Daily  . predniSONE  5 mg Oral Q breakfast  .  sevelamer carbonate  1,600 mg Oral TID WC  . sodium chloride  3 mL Intravenous Q12H  . Warfarin - Pharmacist Dosing Inpatient   Does not apply q1800   Antibiotic[s]: Anti-infectives    Start     Dose/Rate Route Frequency Ordered Stop   11/18/14 1000  sulfamethoxazole-trimethoprim (BACTRIM,SEPTRA) 400-80 MG per tablet 1 tablet  Status:  Discontinued     1 tablet Oral Every M-W-F 11/17/14 1006 11/17/14 1355     ASSESSMENT :  49 y.o. male is currently on Coumadin for history of pulmonary embolism.  The patient admits to being non-compliant with his Coumadin.  Today's INR remains SUB-therapeutic..   INR  1.22  He is receiving Heparin 5000 units sq q 8 hours until INR therapeutic.[name]    No evidence of bleeding complications observed.  GOAL :  TARGET INR 2-3   PLAN : 1. Continue SQ Heparin bridging until INR therapeutic. 2. Coumadin 10 mg po today. 3. Daily INR's, CBC. Monitor for bleeding complications.   Follow Platelet counts.  Marthenia Rolling, Pharm.D  11/18/2014, 3:52 PM

## 2014-11-18 NOTE — Progress Notes (Signed)
After administration of 45m of IV hydralazine, patients blood pressure was 171/106. Patient not to be DC'd per MD. Will continue to monitor.   LShelbie Hutching RN, BSN

## 2014-11-18 NOTE — Progress Notes (Signed)
[facility]                                                                              Patient Demographics  Frank [name], is a 49 y.o. male, DOB - 02/14/1966, LDK:[id number]  Admit date - 11/17/2014   Admitting Physician Frank Cellar, MD  Outpatient Primary MD for the patient is Frank Chessman, MD  LOS - 1   Chief Complaint  Patient presents with  . Shoulder Pain  . Leg Swelling       Brief HPI   Frank [name] is a 49 y.o. male with a history of ESRD on Peritoneal dialysis, HTN who presents to the ED with complaints of increased swelling of his feet and legs, along with SOB and nauasea and vomiting x 2 days. He reported that he has been awaiting Hemodialysis, and was advised to stop his peritoneal dialysis so he has been off of his peritoneal dialysis for the past 4 days. He was transferred from the [facility] to [place]. In there ED, he was found to have Hyperkalemia of 7.0 and was administered the short-term medications to reduce his potassium, and the ED Dr Frank [name] called the nephrologist on Call for emergent dialysis.    Assessment & Plan    Principal Problem: ESRD with hyperkalemia: Off after toenail dialysis for more than 4 days for unclear reasons. - Nephrology was consulted and patient underwent hemodialysis on 4/28 -Follow nephrology recommendations, plan for hemodialysis again today, potassium has improved to 5.1.   Hyperkalemia. -Patient presented with a potassium of 7.2 which likely resulted from stopping peritoneal dialysis. Improved to 5.1 after hemodialysis yesterday   Hypertension. -Currently stable, continue Coreg, minoxidil, hydralazine  - Continue hemodialysis, monitor BP closely    Anemia of chronic disease. -Hemoglobin down to 7.9 today, given Aranesp therapy along with IV iron.    Code Status: FC  Family Communication: Discussed in detail with the patient, all imaging results, lab results explained  to the patient    Disposition Plan: Hopefully home on stable, transfer to telemetry monitor floor today, preferably renal floor  Time Spent in minutes  25 minutes  Procedures  HD  Consults   Nephrology  DVT Prophylaxis  heparin  Medications  Scheduled Meds: . atorvastatin  40 mg Oral q1800  . carvedilol  25 mg Oral BID WC  . cinacalcet  30 mg Oral Q breakfast  . [START ON 11/24/2014] darbepoetin (ARANESP) injection - DIALYSIS  100 mcg Intravenous Q Thu-HD  . doxercalciferol  2.5 mcg Intravenous Q T,Th,Sa-HD  . heparin  5,000 Units Subcutaneous 3 times per day  . hydrALAZINE  100 mg Oral TID  . minoxidil  2.5 mg Oral BID  . pantoprazole  40 mg Oral Daily  . predniSONE  5 mg Oral Q breakfast  . sevelamer carbonate  1,600 mg Oral TID WC  . sodium chloride  3 mL Intravenous Q12H  . Warfarin - Pharmacist Dosing Inpatient   Does not apply q1800   Continuous Infusions:  PRN Meds:.sodium chloride, sodium chloride, sodium chloride, acetaminophen **OR** acetaminophen, alteplase, feeding supplement (NEPRO CARB STEADY), heparin, heparin, HYDROmorphone (DILAUDID)  injection, lidocaine (PF), lidocaine-prilocaine, ondansetron **OR** ondansetron (ZOFRAN) IV, oxyCODONE, pentafluoroprop-tetrafluoroeth, sodium chloride   Antibiotics   Anti-infectives    Start     Dose/Rate Route Frequency Ordered Stop   11/18/14 1000  sulfamethoxazole-trimethoprim (BACTRIM,SEPTRA) 400-80 MG per tablet 1 tablet  Status:  Discontinued     1 tablet Oral Every M-W-F 11/17/14 1006 11/17/14 1355        Subjective:   Frank [name] was seen and examined today. Patient denies dizziness, chest pain, shortness of breath, abdominal pain, N/V/D/C, new weakness, numbess, tingling. No shortness of breath, nausea or vomiting currently.  Objective:   Blood pressure 138/98, pulse 84, temperature 97.9 F (36.6 C), temperature source Oral, resp. rate 12, height _0  (1.854 m), weight 92.8 kg (204 lb 9.4 oz), SpO2 94  %.  Wt Readings from Last 3 Encounters:  11/17/14 92.8 kg (204 lb 9.4 oz)  11/14/14 87.091 kg (192 lb)  10/26/14 81.194 kg (179 lb)     Intake/Output Summary (Last 24 hours) at 11/18/14 1243 Last data filed at 11/18/14 1000  Gross per 24 hour  Intake    600 ml  Output      0 ml  Net    600 ml    Exam  General: Alert and oriented x 3, NAD  HEENT:  PERRLA, EOMI, Anicteic Sclera, mucous membranes moist.   Neck: Supple, no JVD, no masses  CVS: S1 S2 auscultated, no rubs, murmurs or gallops. Regular rate and rhythm.  Respiratory: Decreased breath sound at the bases,no rhonchi  Abdomen: Soft, nontender, nondistended, + bowel sounds  Ext: no cyanosis clubbing, trace edema  Neuro: AAOx3, Cr N's II- XII. Strength 5/5 upper and lower extremities bilaterally  Skin: No rashes  Psych: Normal affect and demeanor, alert and oriented x3    Data Review   Micro Results Recent Results (from the past 240 hour(s))  MRSA PCR Screening     Status: None   Collection Time: 11/17/14 10:07 AM  Result Value Ref Range Status   MRSA by PCR NEGATIVE NEGATIVE Final    Comment:        The GeneXpert MRSA Assay (FDA approved for NASAL specimens only), is one component of a comprehensive MRSA colonization surveillance program. It is not intended to diagnose MRSA infection nor to guide or monitor treatment for MRSA infections.     Radiology Reports Ct Abdomen Pelvis Wo Contrast  11/05/2014   CLINICAL DATA:  Lower left abdominal pain for 1 day  EXAM: CT ABDOMEN AND PELVIS WITHOUT CONTRAST  TECHNIQUE: Multidetector CT imaging of the abdomen and pelvis was performed following the standard protocol without IV contrast.  COMPARISON:  12/21/2013  FINDINGS: Lung bases are free of acute infiltrate or sizable effusion.  Cardiac shadow is enlarged. A small pericardial effusion is noted. The liver, gallbladder, spleen, adrenal glands and pancreas are within normal limits. The native kidneys are  shrunken consistent with patient's given clinical history of end-stage renal disease. A renal transplant is noted within the left lower quadrant stable from the prior study.  Mild ascites is noted consistent with the peritoneal dialysis history. The dialysis catheter is noted low within the pelvis just above the bladder. The bladder is decompressed. A right inguinal hernia containing some dialysate fluid is noted. Diffuse aortoiliac calcifications are noted. No acute bony abnormality is noted.  IMPRESSION: Chronic changes consistent with the patient's given clinical history. No acute abnormality is noted.   Electronically Signed   By: Linus Mako.D.  On: 11/05/2014 10:37   Dg Chest 2 View  11/01/2014   CLINICAL DATA:  Weakness.  EXAM: CHEST  2 VIEW  COMPARISON:  October 07, 2014.  FINDINGS: Stable cardiomegaly. No pneumothorax or pleural effusion is noted. Right internal jugular dialysis catheter is unchanged in position. No acute pulmonary disease is noted. Bony thorax is intact.  IMPRESSION: No acute cardiopulmonary abnormality seen.   Electronically Signed   By: Marijo Conception, M.D.   On: 11/01/2014 09:12   Dg Shoulder Left  11/17/2014   CLINICAL DATA:  Chronic left shoulder pain.  Initial encounter.  EXAM: LEFT SHOULDER - 2+ VIEW  COMPARISON:  Left shoulder radiographs performed 11/28/2012  FINDINGS: There appears to be a mildly displaced osseous Bankart lesion, which may reflect interval dislocation, new from the prior study.  There is no evidence of dislocation at this time. The left humeral head is seated within the glenoid fossa. The acromioclavicular joint is unremarkable in appearance. No significant soft tissue abnormalities are seen. The visualized portions of the left lung are clear.  IMPRESSION: Apparent mildly displaced osseous Bankart lesion, which may reflect dislocation since 2014. No additional evidence for fracture. MRI could be considered to assess the extent of underlying labral  injury.   Electronically Signed   By: Garald Balding M.D.   On: 11/17/2014 06:03    CBC  Recent Labs Lab 11/17/14 0543 11/17/14 0550 11/17/14 1457 11/18/14 0227 11/18/14 1001  WBC 6.5  --   --  6.3 5.7  HGB 8.2* 8.8* 8.5* 7.7* 7.9*  HCT 24.9* 26.0* 25.0* 23.7* 24.3*  PLT 206  --   --  212 190  MCV 83.0  --   --  83.2 84.1  MCH 27.3  --   --  27.0 27.3  MCHC 32.9  --   --  32.5 32.5  RDW 16.3*  --   --  16.2* 16.0*  LYMPHSABS 0.6*  --   --   --   --   MONOABS 0.2  --   --   --   --   EOSABS 0.0  --   --   --   --   BASOSABS 0.0  --   --   --   --     Chemistries   Recent Labs Lab 11/17/14 0543 11/17/14 0550 11/17/14 1100 11/17/14 1457 11/18/14 0227 11/18/14 1001  NA 134* 133*  --  139 135 138  K 7.2* 7.0* 6.5* 4.2 5.0 5.1  CL 99 103  --  100 99 102  CO2 18*  --   --   --  25 25  GLUCOSE 100* 98  --  108* 91 94  BUN 95* 91*  --  54* 41* 45*  CREATININE 19.81* >18.00*  --  12.00* 11.73* 12.82*  CALCIUM 7.5*  --   --   --  7.4* 7.5*   ------------------------------------------------------------------------------------------------------------------ estimated creatinine clearance is 7.8 mL/min (by C-G formula based on Cr of 12.82). ------------------------------------------------------------------------------------------------------------------ No results for input(s): HGBA1C in the last 72 hours. ------------------------------------------------------------------------------------------------------------------ No results for input(s): CHOL, HDL, LDLCALC, TRIG, CHOLHDL, LDLDIRECT in the last 72 hours. ------------------------------------------------------------------------------------------------------------------ No results for input(s): TSH, T4TOTAL, T3FREE, THYROIDAB in the last 72 hours.  Invalid input(s): FREET3 ------------------------------------------------------------------------------------------------------------------ No results for input(s): VITAMINB12,  FOLATE, FERRITIN, TIBC, IRON, RETICCTPCT in the last 72 hours.  Coagulation profile  Recent Labs Lab 11/17/14 0543 11/18/14 0227  INR 1.31 1.22    No results for input(s): DDIMER in the last  72 hours.  Cardiac Enzymes No results for input(s): CKMB, TROPONINI, MYOGLOBIN in the last 168 hours.  Invalid input(s): CK ------------------------------------------------------------------------------------------------------------------ Invalid input(s): POCBNP  No results for input(s): GLUCAP in the last 72 hours.   RAI,RIPUDEEP M.D. [facility] 11/18/2014, 12:43 PM  Pager: [phone]   Between 7am to 7pm - call Pager - [phone]  After 7pm go to [email or web address] - password [email or web address]  Call night coverage person covering after 7pm

## 2014-11-18 NOTE — Progress Notes (Signed)
Pt on floor from dialysis.  Pt bp 183/114 paged Dr. Tana Coast does she want to cancel discharge.

## 2014-11-19 MED ORDER — DOCUSATE SODIUM 100 MG PO CAPS: 100.0000 mg | ORAL_CAPSULE | Freq: Two times a day (BID) | ORAL | Status: DC

## 2014-11-19 MED ORDER — LEVALBUTEROL TARTRATE 45 MCG/ACT IN AERO: 2.0000 | INHALATION_SPRAY | Freq: Four times a day (QID) | RESPIRATORY_TRACT | Status: DC | PRN

## 2014-11-19 MED ORDER — OXYCODONE HCL 5 MG PO TABS: 5.0000 mg | ORAL_TABLET | Freq: Four times a day (QID) | ORAL | Status: DC | PRN

## 2014-11-19 MED ORDER — METHOCARBAMOL 500 MG PO TABS: 500.0000 mg | ORAL_TABLET | Freq: Three times a day (TID) | ORAL | Status: DC | PRN

## 2014-11-19 MED ORDER — LORAZEPAM 0.5 MG PO TABS: 0.5000 mg | ORAL_TABLET | Freq: Every evening | ORAL | Status: DC | PRN

## 2014-11-19 NOTE — Discharge Summary (Signed)
Physician Discharge Summary   Patient ID: Frank [name] MRN: [id number] DOB/AGE: [birth year] 49 y.o.  Admit date: 11/17/2014 Discharge date: 11/19/2014  Primary Care Physician:  Angelica Chessman, MD  Discharge Diagnoses:      ESRD on peritoneal dialysis, noncompliant  . Hyperkalemia . Anasarca . Secondary renal hyperparathyroidism . Anemia of renal disease   uncontrolled hypertension   Consults: Nephrology   Recommendations for Outpatient Follow-up:  Patient was recommended to be compliant with his peritoneal dialysis  He received hemodialysis prior to discharge, he was cleared by nephrology service and after treatment recommended to resume peritoneal dialysis with [facility] dialysis home therapies.  Please follow with the pain clinic, patient has appointment scheduled on 5/19. He was given oxycodone 5 mg #30 tablets to last until his appointment with no refills.    DIET: Renal diet  Allergies:   Allergies  Allergen Reactions  . Ferrlecit [Na Ferric Gluc Cplx In Sucrose] Shortness Of Breath, Swelling and Other (See Comments)    Swelling in throat  . Darvocet [Propoxyphene N-Acetaminophen] Hives     Discharge Medications:   Medication List    STOP taking these medications        polyethylene glycol packet  Commonly known as:  MIRALAX     sulfamethoxazole-trimethoprim 400-80 MG per tablet  Commonly known as:  BACTRIM,SEPTRA      TAKE these medications        atorvastatin 40 MG tablet  Commonly known as:  LIPITOR  Take 1 tablet (40 mg total) by mouth daily at 6 PM.     carvedilol 25 MG tablet  Commonly known as:  COREG  Take 1 tablet (25 mg total) by mouth 2 (two) times daily with a meal.     cinacalcet 30 MG tablet  Commonly known as:  SENSIPAR  Take 30 mg by mouth daily.     diclofenac sodium 1 % Gel  Commonly known as:  VOLTAREN  Apply 2 g topically 4 (four) times daily.     docusate sodium 100 MG capsule  Commonly known as:  COLACE  Take 1  capsule (100 mg total) by mouth 2 (two) times daily.     doxercalciferol 4 MCG/2ML injection  Commonly known as:  HECTOROL  Inject 1.25 mLs (2.5 mcg total) into the vein Every Tuesday,Thursday,and Saturday with dialysis.     hydrALAZINE 25 MG tablet  Commonly known as:  APRESOLINE  Take 100 mg by mouth 3 (three) times daily.     levalbuterol 45 MCG/ACT inhaler  Commonly known as:  XOPENEX HFA  Inhale 2 puffs into the lungs every 6 (six) hours as needed for wheezing or shortness of breath.     lisinopril 10 MG tablet  Commonly known as:  PRINIVIL,ZESTRIL  Take 1 tablet (10 mg total) by mouth daily.     LORazepam 0.5 MG tablet  Commonly known as:  ATIVAN  Take 1 tablet (0.5 mg total) by mouth at bedtime as needed for anxiety.     methocarbamol 500 MG tablet  Commonly known as:  ROBAXIN  Take 1 tablet (500 mg total) by mouth every 8 (eight) hours as needed for muscle spasms.     minoxidil 2.5 MG tablet  Commonly known as:  LONITEN  Take 1 tablet (2.5 mg total) by mouth 2 (two) times daily.     omeprazole 20 MG capsule  Commonly known as:  PRILOSEC  Take 20 mg by mouth daily.     oxyCODONE 5 MG immediate release  tablet  Commonly known as:  ROXICODONE  Take 1 tablet (5 mg total) by mouth every 6 (six) hours as needed for severe pain.     predniSONE 5 MG tablet  Commonly known as:  DELTASONE  Take 5 mg by mouth daily with breakfast.     RENVELA 800 MG tablet  Generic drug:  sevelamer carbonate  Take 1,600 mg by mouth 3 (three) times daily with meals.     warfarin 4 MG tablet  Commonly known as:  COUMADIN  Take 8 mg by mouth daily.         Brief H and P: For complete details please refer to admission H and P, but in brief Frank [name] is a 49 y.o. male with a history of ESRD on Peritoneal dialysis, HTN who presents to the ED with complaints of increased swelling of his feet and legs, along with SOB and nauasea and vomiting x 2 days. He reported that he had been  awaiting Hemodialysis, and was advised to stop his peritoneal dialysis so he has been off of his peritoneal dialysis for the past 4 days. He was transferred from the [facility] to [place]. In there ED, he was found to have Hyperkalemia of 7.0 and was administered the short-term medications to reduce his potassium, and the ED Dr Claudine Mouton has called the nephrologist on Call for emergent dialysis.   Hospital Course:  ESRD with hyperkalemia:  Patient was the peritoneal dialysis for more than 4 days for unclear reasons. nephrology was consulted and patient underwent hemodialysis on 4/28 and 4/29. Dr. Joelyn Oms, with the nephrology service cleared the patient to be discharged home after the hemodialysis  on 4/29 however patient had significant elevated BP readings hence discharged was placed on hold. Patient was recommended to resume peritoneal dialysis with [facility] dialysis home therapies. Patient reports that he has full supplies of peritoneal dialysis at home and he knows PD. He has a history of recurrent noncompliance with dialysis.    Hyperkalemia. -Patient presented with a potassium of 7.2 which likely resulted from stopping peritoneal dialysis. Improved to 5.1 after hemodialysis, received hemodialysis on 4/28 and 4/29    uncontrolled Hypertension. Patient had elevated BP readings in 200s on 4/29 and hence discharge was placed on hold, continue Coreg, minoxidil, hydralazine   Anemia of chronic disease. -Hemoglobin down to 7.9 , given Aranesp therapy along with IV iron.  Chronic left shoulder pain - has first pain clinic appt on 5/19 per patient. Patient given prescription for Robaxin, oxycodone 5 mg (#30 ) with no refills to last until his pain clinic appointment.   Day of Discharge BP 145/90 mmHg  Pulse 69  Temp(Src) 98.4 F (36.9 C) (Oral)  Resp 21  Ht _0  (1.854 m)  Wt 81.2 kg (179 lb 0.2 oz)  BMI 23.62 kg/m2  SpO2 100%  Physical Exam: General: Alert and  awake oriented x3 not in any acute distress. HEENT: anicteric sclera, pupils reactive to light and accommodation CVS: S1-S2 clear no murmur rubs or gallops Chest: clear to auscultation bilaterally, no wheezing rales or rhonchi Abdomen: soft nontender, nondistended, normal bowel sounds Extremities: no cyanosis, clubbing or edema noted bilaterally Neuro: Cranial nerves II-XII intact, no focal neurological deficits   The results of significant diagnostics from this hospitalization (including imaging, microbiology, ancillary and laboratory) are listed below for reference.    LAB RESULTS: Basic Metabolic Panel:  Recent Labs Lab 11/18/14 0227 11/18/14 1001  NA 135 138  K 5.0  5.1  CL 99 102  CO2 25 25  GLUCOSE 91 94  BUN 41* 45*  CREATININE 11.73* 12.82*  CALCIUM 7.4* 7.5*  PHOS  --  5.2*   Liver Function Tests:  Recent Labs Lab 11/18/14 1001  ALBUMIN 2.2*   No results for input(s): LIPASE, AMYLASE in the last 168 hours. No results for input(s): AMMONIA in the last 168 hours. CBC:  Recent Labs Lab 11/17/14 0543  11/18/14 0227 11/18/14 1001  WBC 6.5  --  6.3 5.7  NEUTROABS 5.7  --   --   --   HGB 8.2*  < > 7.7* 7.9*  HCT 24.9*  < > 23.7* 24.3*  MCV 83.0  --  83.2 84.1  PLT 206  --  212 190  < > = values in this interval not displayed. Cardiac Enzymes: No results for input(s): CKTOTAL, CKMB, CKMBINDEX, TROPONINI in the last 168 hours. BNP: Invalid input(s): POCBNP CBG: No results for input(s): GLUCAP in the last 168 hours.  Significant Diagnostic Studies:  Dg Shoulder Left  11/17/2014   CLINICAL DATA:  Chronic left shoulder pain.  Initial encounter.  EXAM: LEFT SHOULDER - 2+ VIEW  COMPARISON:  Left shoulder radiographs performed 11/28/2012  FINDINGS: There appears to be a mildly displaced osseous Bankart lesion, which may reflect interval dislocation, new from the prior study.  There is no evidence of dislocation at this time. The left humeral head is seated within  the glenoid fossa. The acromioclavicular joint is unremarkable in appearance. No significant soft tissue abnormalities are seen. The visualized portions of the left lung are clear.  IMPRESSION: Apparent mildly displaced osseous Bankart lesion, which may reflect dislocation since 2014. No additional evidence for fracture. MRI could be considered to assess the extent of underlying labral injury.   Electronically Signed   By: Garald Balding M.D.   On: 11/17/2014 06:03    2D ECHO:   Disposition and Follow-up: Discharge Instructions    Diet - low sodium heart healthy    Complete by:  As directed      Diet - low sodium heart healthy    Complete by:  As directed      Increase activity slowly    Complete by:  As directed      Increase activity slowly    Complete by:  As directed             DISPOSITION:Home  DISCHARGE FOLLOW-UP Follow-up Information    Follow up with Angelica Chessman, MD. Schedule an appointment as soon as possible for a visit in 10 days.   Specialty:  Internal Medicine   Why:  for hospital follow-up   Contact information:   [address] [place] [address] [phone]        Time spent on Discharge: 25 mins   Signed:   RAI,RIPUDEEP M.D. [facility] 11/19/2014, 1:04 PM Pager: [phone]

## 2014-11-23 ENCOUNTER — Telehealth: Payer: Self-pay | Admitting: *Deleted

## 2014-11-23 NOTE — Telephone Encounter (Signed)
Called patient to let him know that he would have to continue his dialysis at [facility] because [facility] had terminated him as a patient at the practice.

## 2014-11-24 ENCOUNTER — Ambulatory Visit: Payer: Medicare Other | Attending: Internal Medicine

## 2014-11-24 DIAGNOSIS — R29898 Other symptoms and signs involving the musculoskeletal system: Secondary | ICD-10-CM | POA: Diagnosis not present

## 2014-11-24 DIAGNOSIS — M25512 Pain in left shoulder: Secondary | ICD-10-CM

## 2014-11-24 NOTE — Patient Instructions (Signed)
From cabinet neck sidebend and pendulum and scapula retraction 6-8x/day 3-10 reps

## 2014-11-24 NOTE — Therapy (Signed)
[place], [place], [address] Phone: [phone]   Fax:  [phone]  Physical Therapy Evaluation  Patient Details  Name: Frank [name] MRN: [id number] Date of Birth: [birth year] Referring Provider:  Tresa Garter, MD  Encounter Date: 11/24/2014      PT End of Session - 11/24/14 0836    Visit Number 1   Number of Visits 12   Date for PT Re-Evaluation 01/05/15   PT Start Time 0800   PT Stop Time 0850   PT Time Calculation (min) 50 min   Activity Tolerance Patient tolerated treatment well   Behavior During Therapy [facility] for tasks assessed/performed      Past Medical History  Diagnosis Date  . Hypertension   . Depression   . Complication of anesthesia     itching, sore throat  . Diabetes mellitus without complication     No history per patient, but remains under history as A1c would not be accurate given on dialysis  . Shortness of breath   . Anxiety   . ESRD (end stage renal disease)     due to HTN per patient, followed at [facility], s/p failed kidney transplant - dialysis Tue, Th, Sat  . Renal insufficiency   . CHF (congestive heart failure)   . Anemia     Past Surgical History  Procedure Laterality Date  . Kidney receipient  2006    failed and started HD in March 2014  . Capd insertion    . Capd removal    . Left heart catheterization with coronary angiogram N/A 09/02/2014    Procedure: LEFT HEART CATHETERIZATION WITH CORONARY ANGIOGRAM;  Surgeon: [name], MD;  Location: [facility] CATH LAB;  Service: Cardiovascular;  Laterality: N/A;    There were no vitals filed for this visit.  Visit Diagnosis:  Pain in left shoulder - Plan: PT plan of care cert/re-cert  Weakness of left arm - Plan: PT plan of care cert/re-cert      Subjective Assessment - 11/24/14 0801    Subjective Chronic pain with torn ligaments.  He reports testing  to identify this   Pertinent History He reports  catherization for heart 10/ 2015 and pain nstarted after this procedure. He has medicaton and an injection without benefit.    Limitations Lifting  use of Lt arm for self care and home tasks. He was playing basketball and tennis.     How long can you sit comfortably? NA   How long can you stand comfortably? NA   How long can you walk comfortably? NA   Diagnostic tests CT, Xray [place]   Patient Stated Goals Manage pain.    Currently in Pain? Yes   Pain Score 4    Pain Location Shoulder   Pain Orientation Left   Pain Type Chronic pain   Pain Onset More than a month ago   Pain Frequency Constant   Aggravating Factors  Lying on shoulder. dyalisis.    Pain Relieving Factors heat, ice , medication   Multiple Pain Sites No            [facility] PT Assessment - 11/24/14 0754    Assessment   Medical Diagnosis chronic LT shoulder pain   Onset Date --  04/2014   Prior Therapy none   Precautions   Precautions None   Restrictions   Weight Bearing Restrictions No   Balance Screen   Has the patient fallen in the past 6 months  No   Prior Function   Level of Independence Independent with basic ADLs   Cognition   Overall Cognitive Status Within Functional Limits for tasks assessed   ROM / Strength   AROM / PROM / Strength AROM;PROM;Strength   AROM   AROM Assessment Site Shoulder   Right/Left Shoulder Right;Left   Right Shoulder Extension 50 Degrees   Right Shoulder Flexion 130 Degrees   Right Shoulder ABduction 134 Degrees   Right Shoulder Internal Rotation 45 Degrees   Right Shoulder External Rotation 90 Degrees   Right Shoulder Horizontal ABduction 20 Degrees   Right Shoulder Horizontal  ADduction 95 Degrees   Left Shoulder Extension 50 Degrees   Left Shoulder Flexion 95 Degrees   Left Shoulder ABduction 110 Degrees   Left Shoulder Internal Rotation 35 Degrees   Left Shoulder External Rotation 45 Degrees   Left Shoulder Horizontal ABduction 8 Degrees   Left Shoulder Horizontal  ADduction 95 Degrees   PROM   PROM Assessment Site Shoulder   Right/Left Shoulder Left   Left Shoulder Extension 60 Degrees   Left Shoulder Flexion 140 Degrees   Left Shoulder ABduction 140 Degrees   Left Shoulder Internal Rotation 60 Degrees   Left Shoulder External Rotation 90 Degrees   Left Shoulder Horizontal ABduction 25 Degrees   Left Shoulder Horizontal ADduction 110 Degrees   Strength   Overall Strength Comments Normal except LT shoulder ER and abduction 3+/5   Palpation   Palpation Tender over whole LT shoulder anterior,posterior and around whole perimeter of scapula    RT arm larger than LT.                [facility] Adult PT Treatment/Exercise - 11/24/14 0834    Exercises   Exercises Shoulder   Shoulder Exercises: Seated   Retraction AROM;Both;5 reps  and pendulum and neck sidebend stretch   Modalities   Modalities Electrical Stimulation;Moist Heat   Moist Heat Therapy   Number Minutes Moist Heat 20 Minutes   Moist Heat Location Shoulder  LT   Electrical Stimulation   Electrical Stimulation Location LT shoulder   Electrical Stimulation Action IFC   Electrical Stimulation Parameters L15   Electrical Stimulation Goals Pain                PT Education - 11/24/14 [phone]    Education provided Yes   Education Details POC, HEP   Person(s) Educated Patient   Methods Explanation;Demonstration;Tactile cues;Verbal cues;Handout   Comprehension Returned demonstration;Verbalized understanding          PT Short Term Goals - 11/24/14 0840    PT SHORT TERM GOAL #1   Title He will be independent with inital HEP   Time 3   Period Weeks   Status New   PT SHORT TERM GOAL #2   Title He will report pain decreased 30% or mroe with general activity during day.,   Time 3   Period Weeks   Status New   PT SHORT TERM GOAL #3   Title He will report periods of no pain at rest   Time 3   Period Weeks   Status New   PT SHORT TERM GOAL #4   Title He will be able  to flex shoulder LT to 125 degrees   Time 3   Period Weeks   Status New           PT Long Term Goals - 11/24/14 4076    PT LONG TERM GOAL #1  Title He will be independent with all HEP issued as of last visit   Time 6   Period Weeks   Status New   PT LONG TERM GOAL #2   Title He will report pain decreased 75% or more and active range equal RT.  and able to dress and provide self care with no pain   Time 6   Period Weeks   Status New   PT LONG TERM GOAL #3   Title He will reports able to return to normal home activity with mild pain.    Time 6   Period Weeks   Status New               Plan - 12-11-14 5993    Clinical Impression Statement He is tender over whole shoulder and has weakneess with rotator cuff with pain. This limits use of LT UE. He should improve with PT but may be limited due to possible tears    Pt will benefit from skilled therapeutic intervention in order to improve on the following deficits Pain;Impaired UE functional use;Decreased strength;Decreased range of motion   Rehab Potential Good   PT Frequency 2x / week   PT Duration 6 weeks   PT Treatment/Interventions Electrical Stimulation;Moist Heat;Cryotherapy;Ultrasound;Therapeutic exercise;Patient/family education;Passive range of motion;Manual techniques;Dry needling   PT Next Visit Plan Modalities, isometrics, STW   PT Home Exercise Plan gentle scapula , pendulium and neck stretching   Consulted and Agree with Plan of Care Patient          G-Codes - Dec 11, 2014 0855    Functional Assessment Tool Used FOTO   Functional Limitation Other PT primary   Carrying, Moving and Handling Objects Current Status [phone]) At least 40 percent but less than 60 percent impaired, limited or restricted   Carrying, Moving and Handling Objects Goal Status ([id number]) At least 20 percent but less than 40 percent impaired, limited or restricted       Problem List Patient Active Problem List   Diagnosis Date Noted  .  Hyperkalemia 11/17/2014  . Anasarca 11/17/2014  . Anemia of renal disease 11/17/2014  . Other hypervolemia   . Shoulder pain   . Left shoulder pain 11/14/2014  . Essential hypertension, malignant 11/14/2014  . Pulmonary embolism 11/14/2014  . ESRD on hemodialysis   . Pneumonitis   . Chest pain   . SOB (shortness of breath)   . HCAP (healthcare-associated pneumonia) 10/07/2014  . Immunosuppression 10/07/2014  . End-stage renal disease on hemodialysis   . Nonischemic cardiomyopathy   . Chronic pulmonary edema   . Pulmonary embolus   . Acute on chronic combined systolic and diastolic CHF (congestive heart failure)   . Acute pulmonary edema   . Pericardial effusion   . Diabetes type 2, uncontrolled   . Anxiety state   . Depression   . Chronic pain syndrome   . Noncompliance with medication regimen   . Hypertensive urgency 09/30/2014  . Acute diastolic CHF (congestive heart failure) 09/30/2014  . Gastroenteritis presumed infectious 09/30/2014  . Abnormal nuclear stress test 09/01/2014    Class: Diagnosis of  . Subtherapeutic international normalized ratio (INR)   . Chronic anticoagulation 08/30/2014  . Prolonged Q-T interval on ECG 08/30/2014  . Pain in the chest   . Elevated troponin   . Generalized anxiety disorder 05/09/2014  . History of pulmonary embolism 05/08/2014  . Pleuritic chest pain 05/08/2014  . Malignant essential hypertension 05/08/2014  . CHF (congestive heart failure) 10/17/2013  . Chest  pain with high risk for cardiac etiology 09/11/2013  . Secondary renal hyperparathyroidism 06/24/2013  . Anemia of chronic kidney failure 06/24/2013  . Gout 06/24/2013  . Chronic pain 06/24/2013  . ESRD on dialysis- Tues, Thurs, Sat in [place], nephro: [name] 04/13/2013    Darrel Hoover PT 11/24/2014, 9:02 AM  [facility] [address] [place], [place], [address] Phone: [phone]   Fax:   [phone]

## 2014-11-26 ENCOUNTER — Encounter (HOSPITAL_COMMUNITY): Payer: Self-pay | Admitting: *Deleted

## 2014-11-26 DIAGNOSIS — D631 Anemia in chronic kidney disease: Secondary | ICD-10-CM | POA: Diagnosis present

## 2014-11-26 DIAGNOSIS — I509 Heart failure, unspecified: Secondary | ICD-10-CM

## 2014-11-26 DIAGNOSIS — Z86718 Personal history of other venous thrombosis and embolism: Secondary | ICD-10-CM

## 2014-11-26 DIAGNOSIS — R2230 Localized swelling, mass and lump, unspecified upper limb: Secondary | ICD-10-CM

## 2014-11-26 DIAGNOSIS — Z862 Personal history of diseases of the blood and blood-forming organs and certain disorders involving the immune mechanism: Secondary | ICD-10-CM | POA: Insufficient documentation

## 2014-11-26 DIAGNOSIS — I120 Hypertensive chronic kidney disease with stage 5 chronic kidney disease or end stage renal disease: Secondary | ICD-10-CM | POA: Insufficient documentation

## 2014-11-26 DIAGNOSIS — I82621 Acute embolism and thrombosis of deep veins of right upper extremity: Secondary | ICD-10-CM | POA: Diagnosis present

## 2014-11-26 DIAGNOSIS — E1122 Type 2 diabetes mellitus with diabetic chronic kidney disease: Secondary | ICD-10-CM | POA: Diagnosis present

## 2014-11-26 DIAGNOSIS — I2782 Chronic pulmonary embolism: Secondary | ICD-10-CM | POA: Diagnosis present

## 2014-11-26 DIAGNOSIS — R0789 Other chest pain: Secondary | ICD-10-CM | POA: Diagnosis not present

## 2014-11-26 DIAGNOSIS — Z7901 Long term (current) use of anticoagulants: Secondary | ICD-10-CM

## 2014-11-26 DIAGNOSIS — Z79899 Other long term (current) drug therapy: Secondary | ICD-10-CM | POA: Insufficient documentation

## 2014-11-26 DIAGNOSIS — F419 Anxiety disorder, unspecified: Secondary | ICD-10-CM

## 2014-11-26 DIAGNOSIS — I272 Other secondary pulmonary hypertension: Secondary | ICD-10-CM | POA: Diagnosis present

## 2014-11-26 DIAGNOSIS — N186 End stage renal disease: Secondary | ICD-10-CM | POA: Diagnosis present

## 2014-11-26 DIAGNOSIS — E119 Type 2 diabetes mellitus without complications: Secondary | ICD-10-CM

## 2014-11-26 DIAGNOSIS — Z940 Kidney transplant status: Secondary | ICD-10-CM

## 2014-11-26 DIAGNOSIS — I5043 Acute on chronic combined systolic (congestive) and diastolic (congestive) heart failure: Secondary | ICD-10-CM | POA: Diagnosis present

## 2014-11-26 DIAGNOSIS — I313 Pericardial effusion (noninflammatory): Secondary | ICD-10-CM | POA: Diagnosis present

## 2014-11-26 DIAGNOSIS — E1165 Type 2 diabetes mellitus with hyperglycemia: Secondary | ICD-10-CM | POA: Diagnosis present

## 2014-11-26 DIAGNOSIS — Z91128 Patient's intentional underdosing of medication regimen for other reason: Secondary | ICD-10-CM | POA: Diagnosis present

## 2014-11-26 DIAGNOSIS — N2581 Secondary hyperparathyroidism of renal origin: Secondary | ICD-10-CM | POA: Diagnosis present

## 2014-11-26 DIAGNOSIS — Z7952 Long term (current) use of systemic steroids: Secondary | ICD-10-CM

## 2014-11-26 DIAGNOSIS — Z886 Allergy status to analgesic agent status: Secondary | ICD-10-CM

## 2014-11-26 DIAGNOSIS — Z992 Dependence on renal dialysis: Secondary | ICD-10-CM | POA: Insufficient documentation

## 2014-11-26 DIAGNOSIS — E875 Hyperkalemia: Secondary | ICD-10-CM | POA: Diagnosis present

## 2014-11-26 DIAGNOSIS — R079 Chest pain, unspecified: Secondary | ICD-10-CM | POA: Insufficient documentation

## 2014-11-26 DIAGNOSIS — R011 Cardiac murmur, unspecified: Secondary | ICD-10-CM | POA: Diagnosis present

## 2014-11-26 DIAGNOSIS — T465X6A Underdosing of other antihypertensive drugs, initial encounter: Secondary | ICD-10-CM | POA: Diagnosis present

## 2014-11-26 DIAGNOSIS — Z791 Long term (current) use of non-steroidal anti-inflammatories (NSAID): Secondary | ICD-10-CM

## 2014-11-26 DIAGNOSIS — Z888 Allergy status to other drugs, medicaments and biological substances status: Secondary | ICD-10-CM

## 2014-11-26 DIAGNOSIS — Z9119 Patient's noncompliance with other medical treatment and regimen: Secondary | ICD-10-CM | POA: Diagnosis present

## 2014-11-26 DIAGNOSIS — I420 Dilated cardiomyopathy: Secondary | ICD-10-CM | POA: Diagnosis present

## 2014-11-26 DIAGNOSIS — Z87891 Personal history of nicotine dependence: Secondary | ICD-10-CM | POA: Insufficient documentation

## 2014-11-26 DIAGNOSIS — I82C11 Acute embolism and thrombosis of right internal jugular vein: Secondary | ICD-10-CM | POA: Diagnosis present

## 2014-11-26 DIAGNOSIS — G894 Chronic pain syndrome: Secondary | ICD-10-CM | POA: Diagnosis present

## 2014-11-26 NOTE — ED Notes (Signed)
Patient presents stating he is having chest pain to the left side of his chest and his arms are swollen for the past  days

## 2014-11-27 ENCOUNTER — Emergency Department (HOSPITAL_COMMUNITY): Payer: Medicare Other

## 2014-11-27 ENCOUNTER — Emergency Department (HOSPITAL_COMMUNITY)
Admission: EM | Admit: 2014-11-27 | Discharge: 2014-11-27 | Disposition: A | Discharge status: Home / Self Care | Payer: Medicare Other | Source: Home / Self Care | Attending: Emergency Medicine | Admitting: Emergency Medicine

## 2014-11-27 DIAGNOSIS — E875 Hyperkalemia: Secondary | ICD-10-CM

## 2014-11-27 DIAGNOSIS — Z992 Dependence on renal dialysis: Secondary | ICD-10-CM

## 2014-11-27 DIAGNOSIS — R079 Chest pain, unspecified: Secondary | ICD-10-CM

## 2014-11-27 DIAGNOSIS — N186 End stage renal disease: Secondary | ICD-10-CM

## 2014-11-27 LAB — CBC WITH DIFFERENTIAL/PLATELET
BASOS ABS: 0 10*3/uL (ref 0.0–0.1)
Basophils Relative: 0 % (ref 0–1)
Eosinophils Absolute: 0 10*3/uL (ref 0.0–0.7)
Eosinophils Relative: 1 % (ref 0–5)
HCT: 24.7 % — ABNORMAL LOW (ref 39.0–52.0)
Hemoglobin: 8 g/dL — ABNORMAL LOW (ref 13.0–17.0)
LYMPHS ABS: 0.9 10*3/uL (ref 0.7–4.0)
Lymphocytes Relative: 16 % (ref 12–46)
MCH: 27.5 pg (ref 26.0–34.0)
MCHC: 32.4 g/dL (ref 30.0–36.0)
MCV: 84.9 fL (ref 78.0–100.0)
Monocytes Absolute: 0.4 10*3/uL (ref 0.1–1.0)
Monocytes Relative: 7 % (ref 3–12)
NEUTROS ABS: 4.3 10*3/uL (ref 1.7–7.7)
NEUTROS PCT: 76 % (ref 43–77)
Platelets: 199 10*3/uL (ref 150–400)
RBC: 2.91 MIL/uL — AB (ref 4.22–5.81)
RDW: 15.3 % (ref 11.5–15.5)
WBC: 5.7 10*3/uL (ref 4.0–10.5)

## 2014-11-27 LAB — COMPREHENSIVE METABOLIC PANEL
ALT: 19 U/L (ref 17–63)
ANION GAP: 16 — AB (ref 5–15)
AST: 62 U/L — ABNORMAL HIGH (ref 15–41)
Albumin: 2.5 g/dL — ABNORMAL LOW (ref 3.5–5.0)
Alkaline Phosphatase: 110 U/L (ref 38–126)
BUN: 95 mg/dL — ABNORMAL HIGH (ref 6–20)
CALCIUM: 7.4 mg/dL — AB (ref 8.9–10.3)
CO2: 22 mmol/L (ref 22–32)
CREATININE: 20.88 mg/dL — AB (ref 0.61–1.24)
Chloride: 98 mmol/L — ABNORMAL LOW (ref 101–111)
GFR calc Af Amer: 3 mL/min — ABNORMAL LOW (ref >60)
GFR calc non Af Amer: 2 mL/min — ABNORMAL LOW (ref >60)
Glucose, Bld: 124 mg/dL — ABNORMAL HIGH (ref 70–99)
Potassium: 6.1 mmol/L (ref 3.5–5.1)
SODIUM: 136 mmol/L (ref 135–145)
TOTAL PROTEIN: 6.7 g/dL (ref 6.5–8.1)
Total Bilirubin: 0.8 mg/dL (ref 0.3–1.2)

## 2014-11-27 LAB — BRAIN NATRIURETIC PEPTIDE: B Natriuretic Peptide: >4500 pg/mL — ABNORMAL HIGH (ref 0.0–100.0)

## 2014-11-27 LAB — I-STAT TROPONIN, ED: TROPONIN I, POC: 0.05 ng/mL (ref 0.00–0.08)

## 2014-11-27 MED ORDER — SODIUM POLYSTYRENE SULFONATE 15 GM/60ML PO SUSP
30.0000 g | Freq: Once | ORAL | Status: AC
  Administered 2014-11-27: 30 g via ORAL
  Filled 2014-11-27: qty 120

## 2014-11-27 NOTE — Discharge Instructions (Signed)
Continue your dialysis as before.  Follow-up with your nephrologist on Tuesday as scheduled, and return to the ER if your symptoms significantly worsen or change.   Chest Pain (Nonspecific) It is often hard to give a specific diagnosis for the cause of chest pain. There is always a chance that your pain could be related to something serious, such as a heart attack or a blood clot in the lungs. You need to follow up with your health care provider for further evaluation. CAUSES   Heartburn.  Pneumonia or bronchitis.  Anxiety or stress.  Inflammation around your heart (pericarditis) or lung (pleuritis or pleurisy).  A blood clot in the lung.  A collapsed lung (pneumothorax). It can develop suddenly on its own (spontaneous pneumothorax) or from trauma to the chest.  Shingles infection (herpes zoster virus). The chest wall is composed of bones, muscles, and cartilage. Any of these can be the source of the pain.  The bones can be bruised by injury.  The muscles or cartilage can be strained by coughing or overwork.  The cartilage can be affected by inflammation and become sore (costochondritis). DIAGNOSIS  Lab tests or other studies may be needed to find the cause of your pain. Your health care provider may have you take a test called an ambulatory electrocardiogram (ECG). An ECG records your heartbeat patterns over a 24-hour period. You may also have other tests, such as:  Transthoracic echocardiogram (TTE). During echocardiography, sound waves are used to evaluate how blood flows through your heart.  Transesophageal echocardiogram (TEE).  Cardiac monitoring. This allows your health care provider to monitor your heart rate and rhythm in real time.  Holter monitor. This is a portable device that records your heartbeat and can help diagnose heart arrhythmias. It allows your health care provider to track your heart activity for several days, if needed.  Stress tests by exercise or by  giving medicine that makes the heart beat faster. TREATMENT   Treatment depends on what may be causing your chest pain. Treatment may include:  Acid blockers for heartburn.  Anti-inflammatory medicine.  Pain medicine for inflammatory conditions.  Antibiotics if an infection is present.  You may be advised to change lifestyle habits. This includes stopping smoking and avoiding alcohol, caffeine, and chocolate.  You may be advised to keep your head raised (elevated) when sleeping. This reduces the chance of acid going backward from your stomach into your esophagus. Most of the time, nonspecific chest pain will improve within 2-3 days with rest and mild pain medicine.  HOME CARE INSTRUCTIONS   If antibiotics were prescribed, take them as directed. Finish them even if you start to feel better.  For the next few days, avoid physical activities that bring on chest pain. Continue physical activities as directed.  Do not use any tobacco products, including cigarettes, chewing tobacco, or electronic cigarettes.  Avoid drinking alcohol.  Only take medicine as directed by your health care provider.  Follow your health care provider's suggestions for further testing if your chest pain does not go away.  Keep any follow-up appointments you made. If you do not go to an appointment, you could develop lasting (chronic) problems with pain. If there is any problem keeping an appointment, call to reschedule. SEEK MEDICAL CARE IF:   Your chest pain does not go away, even after treatment.  You have a rash with blisters on your chest.  You have a fever. SEEK IMMEDIATE MEDICAL CARE IF:   You have increased chest  pain or pain that spreads to your arm, neck, jaw, back, or abdomen.  You have shortness of breath.  You have an increasing cough, or you cough up blood.  You have severe back or abdominal pain.  You feel nauseous or vomit.  You have severe weakness.  You faint.  You have  chills. This is an emergency. Do not wait to see if the pain will go away. Get medical help at once. Call your local emergency services (911 in [place] Do not drive yourself to the hospital. MAKE SURE YOU:   Understand these instructions.  Will watch your condition.  Will get help right away if you are not doing well or get worse. Document Released: 04/17/2005 Document Revised: 07/13/2013 Document Reviewed: 02/11/2008 [facility] Patient Information 2015 [place], [place]. This information is not intended to replace advice given to you by your health care provider. Make sure you discuss any questions you have with your health care provider.

## 2014-11-27 NOTE — ED Provider Notes (Signed)
CSN: [id number]     Arrival date & time 11/26/14  2334 History  This chart was scribed for Veryl Speak, MD by Randa Evens, ED Scribe. This patient was seen in room B14C/B14C and the patient's care was started at 1:03 AM.    Chief Complaint  Patient presents with  . Chest Pain  . Arm Swelling   Patient is a 49 y.o. male presenting with chest pain. The history is provided by the patient. No language interpreter was used.  Chest Pain Pain location:  Substernal area Pain severity:  Mild Duration:  2 days Relieved by:  None tried Worsened by:  Nothing tried Ineffective treatments:  None tried Associated symptoms: shortness of breath   Associated symptoms: no abdominal pain   Risk factors: hypertension    HPI Comments: Frank [name] is a 49 y.o. male with with PMHx CHF who presents to the Emergency Department complaining of CP onset 1 day ago. Pt reports intermittent SOB. Pt reports bilateral arm swelling as well. Pt states that he is on peritoneal dialysis that he completes everyday at home. Pt states that he no longer produces urine. Pt presents with a dialysis catheter as well that he states has been in for the past year. Pt denies abdominal pain or other related symptoms.    Past Medical History  Diagnosis Date  . Hypertension   . Depression   . Complication of anesthesia     itching, sore throat  . Diabetes mellitus without complication     No history per patient, but remains under history as A1c would not be accurate given on dialysis  . Shortness of breath   . Anxiety   . ESRD (end stage renal disease)     due to HTN per patient, followed at [facility], s/p failed kidney transplant - dialysis Tue, Th, Sat  . Renal insufficiency   . CHF (congestive heart failure)   . Anemia    Past Surgical History  Procedure Laterality Date  . Kidney receipient  2006    failed and started HD in March 2014  . Capd insertion    . Capd removal    . Left heart catheterization with coronary  angiogram N/A 09/02/2014    Procedure: LEFT HEART CATHETERIZATION WITH CORONARY ANGIOGRAM;  Surgeon: [name], MD;  Location: [facility] CATH LAB;  Service: Cardiovascular;  Laterality: N/A;   No family history on file. History  Substance Use Topics  . Smoking status: Former Smoker -- 1.00 packs/day for 1 years    Types: Cigarettes  . Smokeless tobacco: Never Used     Comment: quit Jan 2014  . Alcohol Use: No    Review of Systems  Respiratory: Positive for shortness of breath.   Cardiovascular: Positive for chest pain.  Gastrointestinal: Negative for abdominal pain.  All other systems reviewed and are negative.    Allergies  Ferrlecit and Darvocet  Home Medications   Prior to Admission medications   Medication Sig Start Date End Date Taking? Authorizing Provider  atorvastatin (LIPITOR) 40 MG tablet Take 1 tablet (40 mg total) by mouth daily at 6 PM. 09/02/14  Yes Barton Dubois, MD  carvedilol (COREG) 25 MG tablet Take 1 tablet (25 mg total) by mouth 2 (two) times daily with a meal. 10/10/14  Yes [name], DO  cinacalcet (SENSIPAR) 30 MG tablet Take 30 mg by mouth daily.   Yes Historical Provider, MD  diclofenac sodium (VOLTAREN) 1 % GEL Apply 2 g topically 4 (four)  times daily. 11/14/14  Yes Tresa Garter, MD  docusate sodium (COLACE) 100 MG capsule Take 1 capsule (100 mg total) by mouth 2 (two) times daily. 11/19/14  Yes [name], MD  doxercalciferol (HECTOROL) 4 MCG/2ML injection Inject 1.25 mLs (2.5 mcg total) into the vein Every Tuesday,Thursday,and Saturday with dialysis. 10/10/14  Yes [name], DO  hydrALAZINE (APRESOLINE) 25 MG tablet Take 100 mg by mouth 3 (three) times daily.    Yes Historical Provider, MD  levalbuterol [facility] HFA) 45 MCG/ACT inhaler Inhale 2 puffs into the lungs every 6 (six) hours as needed for wheezing or shortness of breath. 11/19/14  Yes [name], MD  lisinopril (PRINIVIL,ZESTRIL) 10 MG tablet Take 1 tablet (10 mg total) by  mouth daily. 10/10/14  Yes Jessica U Vann, DO  LORazepam (ATIVAN) 0.5 MG tablet Take 1 tablet (0.5 mg total) by mouth at bedtime as needed for anxiety. 11/19/14  Yes [name], MD  methocarbamol (ROBAXIN) 500 MG tablet Take 1 tablet (500 mg total) by mouth every 8 (eight) hours as needed for muscle spasms. 11/19/14  Yes [name], MD  minoxidil (LONITEN) 2.5 MG tablet Take 1 tablet (2.5 mg total) by mouth 2 (two) times daily. 10/10/14  Yes [name], DO  omeprazole (PRILOSEC) 20 MG capsule Take 20 mg by mouth daily. 07/01/13  Yes Historical Provider, MD  oxyCODONE (ROXICODONE) 5 MG immediate release tablet Take 1 tablet (5 mg total) by mouth every 6 (six) hours as needed for severe pain. 11/19/14  Yes [name], MD  predniSONE (DELTASONE) 5 MG tablet Take 5 mg by mouth daily with breakfast.   Yes Historical Provider, MD  sevelamer carbonate (RENVELA) 800 MG tablet Take 1,600 mg by mouth 3 (three) times daily with meals. 12/27/13 12/27/14 Yes Historical Provider, MD  warfarin (COUMADIN) 4 MG tablet Take 8 mg by mouth daily.   Yes Historical Provider, MD   BP 179/118 mmHg  Pulse 60  Temp(Src) 98.4 F (36.9 C) (Oral)  Resp 12  SpO2 99%   Physical Exam  Constitutional: He is oriented to person, place, and time. He appears well-developed and well-nourished. No distress.  HENT:  Head: Normocephalic and atraumatic.  Eyes: Conjunctivae and EOM are normal.  Neck: Neck supple. No tracheal deviation present.  Cardiovascular: Normal rate, regular rhythm and normal heart sounds.   No murmur heard. Pulmonary/Chest: Effort normal and breath sounds normal. No respiratory distress. He has no wheezes. He has no rales.  Abdominal: Soft. There is no tenderness.  Musculoskeletal: Normal range of motion.  Mild swelling to both forearms. No redness or erythema noted.   Neurological: He is alert and oriented to person, place, and time.  Skin: Skin is warm and dry.  Psychiatric: He has a normal mood  and affect. His behavior is normal.  Nursing note and vitals reviewed.   ED Course  Procedures (including critical care time) DIAGNOSTIC STUDIES: Oxygen Saturation is 99% on RA, normal by my interpretation.    COORDINATION OF CARE: 1:36 AM-Discussed treatment plan with pt at bedside and pt agreed to plan.     Labs Review Labs Reviewed  BRAIN NATRIURETIC PEPTIDE - Abnormal; Notable for the following:    B Natriuretic Peptide >4500.0 (*)    All other components within normal limits  CBC WITH DIFFERENTIAL/PLATELET - Abnormal; Notable for the following:    RBC 2.91 (*)    Hemoglobin 8.0 (*)    HCT 24.7 (*)  All other components within normal limits  COMPREHENSIVE METABOLIC PANEL - Abnormal; Notable for the following:    Potassium 6.1 (*)    Chloride 98 (*)    Glucose, Bld 124 (*)    BUN 95 (*)    Creatinine, Ser 20.88 (*)    Calcium 7.4 (*)    Albumin 2.5 (*)    AST 62 (*)    GFR calc non Af Amer 2 (*)    GFR calc Af Amer 3 (*)    Anion gap 16 (*)    All other components within normal limits  I-STAT TROPOININ, ED    Imaging Review Dg Chest 2 View  11/27/2014   CLINICAL DATA:  Dyspnea and bilateral upper extremity swelling  EXAM: CHEST  2 VIEW  COMPARISON:  11/01/2014  FINDINGS: There is a right jugular central line with tip in the right atrium. There is moderate cardiomegaly, unchanged. The lungs are clear. There are no pleural effusions. Pulmonary vasculature is normal.  IMPRESSION: Cardiomegaly.  No acute findings.   Electronically Signed   By: Andreas Newport M.D.   On: 11/27/2014 01:54    ED ECG REPORT   Date: 11/27/2014  Rate: 61  Rhythm: normal sinus rhythm  QRS Axis: left  Intervals: normal  ST/T Wave abnormalities: nonspecific T wave changes  Conduction Disutrbances:none  Narrative Interpretation:   Old EKG Reviewed: none available  I have personally reviewed the EKG tracing and agree with the computerized printout as noted.   MDM   Final  diagnoses:  None      Patient presents here with complaints of chest discomfort for the past 2 days. His symptoms are atypical for cardiac pain and EKG and troponin are unremarkable. He performs peritoneal dialysis at home and is also concerned about swelling to his forearms. I appreciate minimal if any forearms swelling and doubt anything emergent.  His laboratory studies did return showing a potassium of 6.1 and BUN of 95. Both of these are higher than his baseline. I discussed these findings with Dr. Jonnie Finner from nephrology who is recommending a dose of Kayexalate and continued peritoneal dialysis. Patient is followed by a nephrologist in [facility] and will see him early this week in follow-up. He does understand to return if his symptoms worsen or change.   I personally performed the services described in this documentation, which was scribed in my presence. The recorded information has been reviewed and is accurate.      Veryl Speak, MD 11/27/14 [phone]

## 2014-11-27 NOTE — ED Notes (Signed)
BNP greater than 4,500; critical result from lab

## 2014-11-28 ENCOUNTER — Encounter (HOSPITAL_COMMUNITY): Payer: Self-pay

## 2014-11-28 ENCOUNTER — Emergency Department (HOSPITAL_COMMUNITY): Payer: Medicare Other

## 2014-11-28 LAB — CBC
HCT: 24.8 % — ABNORMAL LOW (ref 39.0–52.0)
Hemoglobin: 8.2 g/dL — ABNORMAL LOW (ref 13.0–17.0)
MCH: 27.3 pg (ref 26.0–34.0)
MCHC: 33.1 g/dL (ref 30.0–36.0)
MCV: 82.7 fL (ref 78.0–100.0)
PLATELETS: 220 10*3/uL (ref 150–400)
RBC: 3 MIL/uL — AB (ref 4.22–5.81)
RDW: 15 % (ref 11.5–15.5)
WBC: 5.7 10*3/uL (ref 4.0–10.5)

## 2014-11-28 LAB — BASIC METABOLIC PANEL
Anion gap: 22 — ABNORMAL HIGH (ref 5–15)
BUN: 106 mg/dL — AB (ref 6–20)
CO2: 16 mmol/L — ABNORMAL LOW (ref 22–32)
CREATININE: 23.04 mg/dL — AB (ref 0.61–1.24)
Calcium: 7.4 mg/dL — ABNORMAL LOW (ref 8.9–10.3)
Chloride: 100 mmol/L — ABNORMAL LOW (ref 101–111)
GFR, EST AFRICAN AMERICAN: 2 mL/min — AB (ref >60)
GFR, EST NON AFRICAN AMERICAN: 2 mL/min — AB (ref >60)
Glucose, Bld: 79 mg/dL (ref 70–99)
Potassium: 6 mmol/L — ABNORMAL HIGH (ref 3.5–5.1)
Sodium: 138 mmol/L (ref 135–145)

## 2014-11-28 LAB — PROTIME-INR
INR: 1.18 (ref 0.00–1.49)
Prothrombin Time: 15.1 seconds (ref 11.6–15.2)

## 2014-11-28 LAB — I-STAT TROPONIN, ED: Troponin i, poc: 0.06 ng/mL (ref 0.00–0.08)

## 2014-11-28 NOTE — ED Notes (Signed)
Chest pain for 2 days, onset 2 days ago and seen here for same sts no improvement.

## 2014-11-29 ENCOUNTER — Encounter (HOSPITAL_COMMUNITY): Payer: Medicare Other

## 2014-11-29 ENCOUNTER — Inpatient Hospital Stay (HOSPITAL_COMMUNITY): Payer: Medicare Other

## 2014-11-29 ENCOUNTER — Encounter (HOSPITAL_COMMUNITY): Payer: Self-pay | Admitting: *Deleted

## 2014-11-29 ENCOUNTER — Inpatient Hospital Stay (HOSPITAL_COMMUNITY)
Admission: EM | Admit: 2014-11-29 | Discharge: 2014-12-05 | DRG: 682 | Disposition: A | Discharge status: Home / Self Care | Payer: Medicare Other | Attending: Internal Medicine | Admitting: Internal Medicine

## 2014-11-29 DIAGNOSIS — D631 Anemia in chronic kidney disease: Secondary | ICD-10-CM | POA: Diagnosis present

## 2014-11-29 DIAGNOSIS — I420 Dilated cardiomyopathy: Secondary | ICD-10-CM | POA: Diagnosis present

## 2014-11-29 DIAGNOSIS — Z886 Allergy status to analgesic agent status: Secondary | ICD-10-CM | POA: Diagnosis not present

## 2014-11-29 DIAGNOSIS — I272 Other secondary pulmonary hypertension: Secondary | ICD-10-CM | POA: Diagnosis present

## 2014-11-29 DIAGNOSIS — I319 Disease of pericardium, unspecified: Secondary | ICD-10-CM | POA: Diagnosis not present

## 2014-11-29 DIAGNOSIS — M79621 Pain in right upper arm: Secondary | ICD-10-CM

## 2014-11-29 DIAGNOSIS — M25421 Effusion, right elbow: Secondary | ICD-10-CM

## 2014-11-29 DIAGNOSIS — Z9119 Patient's noncompliance with other medical treatment and regimen: Secondary | ICD-10-CM

## 2014-11-29 DIAGNOSIS — N186 End stage renal disease: Secondary | ICD-10-CM | POA: Diagnosis present

## 2014-11-29 DIAGNOSIS — Z91199 Patient's noncompliance with other medical treatment and regimen due to unspecified reason: Secondary | ICD-10-CM | POA: Diagnosis present

## 2014-11-29 DIAGNOSIS — Z86718 Personal history of other venous thrombosis and embolism: Secondary | ICD-10-CM | POA: Diagnosis not present

## 2014-11-29 DIAGNOSIS — E1129 Type 2 diabetes mellitus with other diabetic kidney complication: Secondary | ICD-10-CM | POA: Diagnosis present

## 2014-11-29 DIAGNOSIS — I10 Essential (primary) hypertension: Secondary | ICD-10-CM | POA: Diagnosis present

## 2014-11-29 DIAGNOSIS — M79609 Pain in unspecified limb: Secondary | ICD-10-CM | POA: Diagnosis not present

## 2014-11-29 DIAGNOSIS — I2782 Chronic pulmonary embolism: Secondary | ICD-10-CM | POA: Diagnosis present

## 2014-11-29 DIAGNOSIS — I270 Primary pulmonary hypertension: Secondary | ICD-10-CM | POA: Diagnosis not present

## 2014-11-29 DIAGNOSIS — R1110 Vomiting, unspecified: Secondary | ICD-10-CM

## 2014-11-29 DIAGNOSIS — R011 Cardiac murmur, unspecified: Secondary | ICD-10-CM | POA: Diagnosis present

## 2014-11-29 DIAGNOSIS — I313 Pericardial effusion (noninflammatory): Secondary | ICD-10-CM | POA: Diagnosis present

## 2014-11-29 DIAGNOSIS — N189 Chronic kidney disease, unspecified: Secondary | ICD-10-CM

## 2014-11-29 DIAGNOSIS — I5043 Acute on chronic combined systolic (congestive) and diastolic (congestive) heart failure: Secondary | ICD-10-CM | POA: Diagnosis present

## 2014-11-29 DIAGNOSIS — R601 Generalized edema: Secondary | ICD-10-CM | POA: Diagnosis not present

## 2014-11-29 DIAGNOSIS — E1165 Type 2 diabetes mellitus with hyperglycemia: Secondary | ICD-10-CM

## 2014-11-29 DIAGNOSIS — Z91128 Patient's intentional underdosing of medication regimen for other reason: Secondary | ICD-10-CM | POA: Diagnosis present

## 2014-11-29 DIAGNOSIS — E875 Hyperkalemia: Secondary | ICD-10-CM

## 2014-11-29 DIAGNOSIS — Z7901 Long term (current) use of anticoagulants: Secondary | ICD-10-CM | POA: Diagnosis not present

## 2014-11-29 DIAGNOSIS — J918 Pleural effusion in other conditions classified elsewhere: Secondary | ICD-10-CM | POA: Diagnosis not present

## 2014-11-29 DIAGNOSIS — Z888 Allergy status to other drugs, medicaments and biological substances status: Secondary | ICD-10-CM | POA: Diagnosis not present

## 2014-11-29 DIAGNOSIS — Z940 Kidney transplant status: Secondary | ICD-10-CM | POA: Diagnosis not present

## 2014-11-29 DIAGNOSIS — I82C11 Acute embolism and thrombosis of right internal jugular vein: Secondary | ICD-10-CM | POA: Diagnosis present

## 2014-11-29 DIAGNOSIS — Z7952 Long term (current) use of systemic steroids: Secondary | ICD-10-CM | POA: Diagnosis not present

## 2014-11-29 DIAGNOSIS — I120 Hypertensive chronic kidney disease with stage 5 chronic kidney disease or end stage renal disease: Secondary | ICD-10-CM | POA: Diagnosis present

## 2014-11-29 DIAGNOSIS — I82621 Acute embolism and thrombosis of deep veins of right upper extremity: Secondary | ICD-10-CM | POA: Diagnosis present

## 2014-11-29 DIAGNOSIS — R791 Abnormal coagulation profile: Secondary | ICD-10-CM | POA: Diagnosis present

## 2014-11-29 DIAGNOSIS — R079 Chest pain, unspecified: Secondary | ICD-10-CM

## 2014-11-29 DIAGNOSIS — G894 Chronic pain syndrome: Secondary | ICD-10-CM | POA: Diagnosis present

## 2014-11-29 DIAGNOSIS — N185 Chronic kidney disease, stage 5: Secondary | ICD-10-CM | POA: Diagnosis not present

## 2014-11-29 DIAGNOSIS — E1122 Type 2 diabetes mellitus with diabetic chronic kidney disease: Secondary | ICD-10-CM | POA: Diagnosis present

## 2014-11-29 DIAGNOSIS — N2581 Secondary hyperparathyroidism of renal origin: Secondary | ICD-10-CM | POA: Diagnosis present

## 2014-11-29 DIAGNOSIS — T465X6A Underdosing of other antihypertensive drugs, initial encounter: Secondary | ICD-10-CM | POA: Diagnosis present

## 2014-11-29 DIAGNOSIS — Z87891 Personal history of nicotine dependence: Secondary | ICD-10-CM | POA: Diagnosis not present

## 2014-11-29 DIAGNOSIS — Z992 Dependence on renal dialysis: Secondary | ICD-10-CM | POA: Diagnosis not present

## 2014-11-29 DIAGNOSIS — R0789 Other chest pain: Secondary | ICD-10-CM | POA: Diagnosis present

## 2014-11-29 DIAGNOSIS — IMO0002 Reserved for concepts with insufficient information to code with codable children: Secondary | ICD-10-CM | POA: Diagnosis present

## 2014-11-29 HISTORY — DX: Essential (primary) hypertension: I10

## 2014-11-29 LAB — RENAL FUNCTION PANEL
Albumin: 2.2 g/dL — ABNORMAL LOW (ref 3.5–5.0)
Anion gap: 16 — ABNORMAL HIGH (ref 5–15)
BUN: 107 mg/dL — AB (ref 6–20)
CO2: 18 mmol/L — ABNORMAL LOW (ref 22–32)
Calcium: 7.2 mg/dL — ABNORMAL LOW (ref 8.9–10.3)
Chloride: 100 mmol/L — ABNORMAL LOW (ref 101–111)
Creatinine, Ser: 22.98 mg/dL — ABNORMAL HIGH (ref 0.61–1.24)
GFR calc Af Amer: 2 mL/min — ABNORMAL LOW (ref >60)
GFR calc non Af Amer: 2 mL/min — ABNORMAL LOW (ref >60)
Glucose, Bld: 111 mg/dL — ABNORMAL HIGH (ref 70–99)
Phosphorus: 8 mg/dL — ABNORMAL HIGH (ref 2.5–4.6)
Potassium: 6.3 mmol/L (ref 3.5–5.1)
Sodium: 134 mmol/L — ABNORMAL LOW (ref 135–145)

## 2014-11-29 LAB — MAGNESIUM: Magnesium: 1.9 mg/dL (ref 1.7–2.4)

## 2014-11-29 LAB — TYPE AND SCREEN
ABO/RH(D): B POS
ANTIBODY SCREEN: NEGATIVE

## 2014-11-29 LAB — CBC
HCT: 22.4 % — ABNORMAL LOW (ref 39.0–52.0)
HEMOGLOBIN: 7.4 g/dL — AB (ref 13.0–17.0)
MCH: 27.7 pg (ref 26.0–34.0)
MCHC: 33 g/dL (ref 30.0–36.0)
MCV: 83.9 fL (ref 78.0–100.0)
Platelets: 205 10*3/uL (ref 150–400)
RBC: 2.67 MIL/uL — AB (ref 4.22–5.81)
RDW: 15.2 % (ref 11.5–15.5)
WBC: 5.2 10*3/uL (ref 4.0–10.5)

## 2014-11-29 LAB — HEPARIN LEVEL (UNFRACTIONATED): HEPARIN UNFRACTIONATED: 1.8 [IU]/mL — AB (ref 0.30–0.70)

## 2014-11-29 LAB — MRSA PCR SCREENING: MRSA BY PCR: NEGATIVE

## 2014-11-29 MED ORDER — HYDRALAZINE HCL 50 MG PO TABS
100.0000 mg | ORAL_TABLET | Freq: Three times a day (TID) | ORAL | Status: DC
  Administered 2014-11-29 – 2014-12-01 (×9): 100 mg via ORAL
  Filled 2014-11-29 (×13): qty 2

## 2014-11-29 MED ORDER — PREDNISONE 5 MG PO TABS
5.0000 mg | ORAL_TABLET | Freq: Every day | ORAL | Status: DC
  Administered 2014-11-29 – 2014-12-05 (×7): 5 mg via ORAL
  Filled 2014-11-29 (×9): qty 1

## 2014-11-29 MED ORDER — HEPARIN BOLUS VIA INFUSION
2000.0000 [IU] | Freq: Once | INTRAVENOUS | Status: AC
  Administered 2014-11-29: 2000 [IU] via INTRAVENOUS
  Filled 2014-11-29: qty 2000

## 2014-11-29 MED ORDER — HEPARIN SODIUM (PORCINE) 1000 UNIT/ML IJ SOLN: 500.0000 [IU] | INTRAMUSCULAR | Status: DC | PRN

## 2014-11-29 MED ORDER — DOCUSATE SODIUM 100 MG PO CAPS
100.0000 mg | ORAL_CAPSULE | Freq: Two times a day (BID) | ORAL | Status: DC
  Administered 2014-11-29: 100 mg via ORAL
  Filled 2014-11-29 (×2): qty 1

## 2014-11-29 MED ORDER — DELFLEX-LC/4.25% DEXTROSE 483 MOSM/L IP SOLN: INTRAPERITONEAL | Status: DC

## 2014-11-29 MED ORDER — CARVEDILOL 25 MG PO TABS
25.0000 mg | ORAL_TABLET | Freq: Two times a day (BID) | ORAL | Status: DC
  Filled 2014-11-29 (×3): qty 1

## 2014-11-29 MED ORDER — SODIUM POLYSTYRENE SULFONATE 15 GM/60ML PO SUSP
30.0000 g | Freq: Once | ORAL | Status: AC
  Administered 2014-11-29: 30 g via ORAL
  Filled 2014-11-29: qty 120

## 2014-11-29 MED ORDER — CARVEDILOL 25 MG PO TABS
25.0000 mg | ORAL_TABLET | Freq: Two times a day (BID) | ORAL | Status: DC
  Administered 2014-11-29 – 2014-12-05 (×12): 25 mg via ORAL
  Filled 2014-11-29 (×14): qty 1

## 2014-11-29 MED ORDER — METHOCARBAMOL 500 MG PO TABS
500.0000 mg | ORAL_TABLET | Freq: Three times a day (TID) | ORAL | Status: DC | PRN
  Filled 2014-11-29: qty 1

## 2014-11-29 MED ORDER — PANTOPRAZOLE SODIUM 40 MG PO TBEC
40.0000 mg | DELAYED_RELEASE_TABLET | Freq: Every day | ORAL | Status: DC
  Administered 2014-11-29 – 2014-12-05 (×6): 40 mg via ORAL
  Filled 2014-11-29 (×5): qty 1

## 2014-11-29 MED ORDER — ACETAMINOPHEN 325 MG PO TABS
650.0000 mg | ORAL_TABLET | ORAL | Status: DC | PRN
  Administered 2014-11-30: 650 mg via ORAL
  Filled 2014-11-29: qty 2

## 2014-11-29 MED ORDER — LABETALOL HCL 5 MG/ML IV SOLN
20.0000 mg | INTRAVENOUS | Status: DC | PRN
  Administered 2014-11-30 (×2): 20 mg via INTRAVENOUS
  Filled 2014-11-29 (×3): qty 4

## 2014-11-29 MED ORDER — HEPARIN SODIUM (PORCINE) 1000 UNIT/ML IJ SOLN
2500.0000 [IU] | Freq: Four times a day (QID) | INTRAMUSCULAR | Status: DC | PRN
  Filled 2014-11-29 (×2): qty 2.5
  Filled 2014-11-29: qty 3
  Filled 2014-11-29 (×3): qty 2.5

## 2014-11-29 MED ORDER — OXYCODONE HCL 5 MG PO TABS
5.0000 mg | ORAL_TABLET | Freq: Two times a day (BID) | ORAL | Status: DC | PRN
  Administered 2014-11-29 – 2014-12-04 (×9): 5 mg via ORAL
  Filled 2014-11-29 (×10): qty 1

## 2014-11-29 MED ORDER — ATORVASTATIN CALCIUM 40 MG PO TABS
40.0000 mg | ORAL_TABLET | Freq: Every day | ORAL | Status: DC
  Administered 2014-11-29 – 2014-12-04 (×5): 40 mg via ORAL
  Filled 2014-11-29 (×7): qty 1

## 2014-11-29 MED ORDER — MORPHINE SULFATE 4 MG/ML IJ SOLN
4.0000 mg | Freq: Once | INTRAMUSCULAR | Status: AC
  Administered 2014-11-29: 4 mg via INTRAVENOUS
  Filled 2014-11-29: qty 1

## 2014-11-29 MED ORDER — NITROGLYCERIN IN D5W 200-5 MCG/ML-% IV SOLN
0.0000 ug/min | INTRAVENOUS | Status: DC
  Administered 2014-11-29: 10 ug/min via INTRAVENOUS
  Filled 2014-11-29 (×2): qty 250

## 2014-11-29 MED ORDER — SODIUM CHLORIDE 0.9 % IJ SOLN
10.0000 mL | INTRAMUSCULAR | Status: DC | PRN
  Administered 2014-11-29: 20 mL

## 2014-11-29 MED ORDER — SODIUM POLYSTYRENE SULFONATE 15 GM/60ML PO SUSP
60.0000 g | Freq: Once | ORAL | Status: DC
  Filled 2014-11-29: qty 240

## 2014-11-29 MED ORDER — ONDANSETRON HCL 4 MG/2ML IJ SOLN
4.0000 mg | Freq: Four times a day (QID) | INTRAMUSCULAR | Status: DC | PRN
  Administered 2014-11-29 – 2014-12-02 (×4): 4 mg via INTRAVENOUS
  Filled 2014-11-29 (×4): qty 2

## 2014-11-29 MED ORDER — NITROGLYCERIN 2 % TD OINT
1.0000 [in_us] | TOPICAL_OINTMENT | Freq: Four times a day (QID) | TRANSDERMAL | Status: DC
  Administered 2014-11-29: 1 [in_us] via TOPICAL
  Filled 2014-11-29: qty 1
  Filled 2014-11-29: qty 30

## 2014-11-29 MED ORDER — ALBUTEROL SULFATE (2.5 MG/3ML) 0.083% IN NEBU: 3.0000 mL | INHALATION_SOLUTION | Freq: Four times a day (QID) | RESPIRATORY_TRACT | Status: DC | PRN

## 2014-11-29 MED ORDER — DELFLEX-LC/2.5% DEXTROSE 394 MOSM/L IP SOLN
INTRAPERITONEAL | Status: DC
  Administered 2014-11-29: 5000 mL via INTRAPERITONEAL

## 2014-11-29 MED ORDER — DELFLEX-LC/4.25% DEXTROSE 483 MOSM/L IP SOLN
Freq: Once | INTRAPERITONEAL | Status: AC
  Administered 2014-11-29: 3000 mL via INTRAPERITONEAL

## 2014-11-29 MED ORDER — SODIUM CHLORIDE 0.9 % IV SOLN
3.0000 g | Freq: Once | INTRAVENOUS | Status: DC
  Filled 2014-11-29: qty 30

## 2014-11-29 MED ORDER — DELFLEX-LC/4.25% DEXTROSE 483 MOSM/L IP SOLN
INTRAPERITONEAL | Status: DC
  Administered 2014-11-29 (×2): 5000 mL via INTRAPERITONEAL

## 2014-11-29 MED ORDER — HEPARIN SODIUM (PORCINE) 1000 UNIT/ML IJ SOLN
1500.0000 [IU] | INTRAMUSCULAR | Status: DC | PRN
  Administered 2014-11-29: 1500 [IU] via INTRAPERITONEAL
  Filled 2014-11-29 (×2): qty 1.5

## 2014-11-29 MED ORDER — SODIUM CHLORIDE 0.9 % IV SOLN
2.0000 g | Freq: Once | INTRAVENOUS | Status: DC
  Filled 2014-11-29: qty 20

## 2014-11-29 MED ORDER — HEPARIN SODIUM (PORCINE) 1000 UNIT/ML IJ SOLN
1000.0000 [IU] | Freq: Once | INTRAMUSCULAR | Status: AC
  Administered 2014-11-29: 1900 [IU] via INTRAVENOUS

## 2014-11-29 MED ORDER — ENOXAPARIN SODIUM 80 MG/0.8ML ~~LOC~~ SOLN
1.0000 mg/kg | Freq: Once | SUBCUTANEOUS | Status: DC
  Filled 2014-11-29: qty 0.8

## 2014-11-29 MED ORDER — MINOXIDIL 2.5 MG PO TABS
2.5000 mg | ORAL_TABLET | Freq: Two times a day (BID) | ORAL | Status: DC
  Administered 2014-11-29 – 2014-12-01 (×6): 2.5 mg via ORAL
  Filled 2014-11-29 (×8): qty 1

## 2014-11-29 MED ORDER — GENTAMICIN SULFATE 0.1 % EX CREA
1.0000 "application " | TOPICAL_CREAM | Freq: Every day | CUTANEOUS | Status: DC
  Administered 2014-11-30 – 2014-12-05 (×5): 1 via TOPICAL
  Filled 2014-11-29 (×2): qty 15

## 2014-11-29 MED ORDER — SEVELAMER CARBONATE 800 MG PO TABS
1600.0000 mg | ORAL_TABLET | Freq: Three times a day (TID) | ORAL | Status: DC
  Administered 2014-11-29 – 2014-12-05 (×12): 1600 mg via ORAL
  Filled 2014-11-29 (×22): qty 2

## 2014-11-29 MED ORDER — CINACALCET HCL 30 MG PO TABS
30.0000 mg | ORAL_TABLET | Freq: Every day | ORAL | Status: DC
  Administered 2014-11-29 – 2014-12-05 (×7): 30 mg via ORAL
  Filled 2014-11-29 (×9): qty 1

## 2014-11-29 MED ORDER — SODIUM CHLORIDE 0.9 % IV SOLN: Freq: Once | INTRAVENOUS | Status: DC

## 2014-11-29 MED ORDER — NITROGLYCERIN IN D5W 200-5 MCG/ML-% IV SOLN
5.0000 ug/min | INTRAVENOUS | Status: DC
  Administered 2014-11-29: 10 ug/min via INTRAVENOUS
  Filled 2014-11-29: qty 250

## 2014-11-29 MED ORDER — SODIUM CHLORIDE 0.9 % IV SOLN
1.0000 g | Freq: Once | INTRAVENOUS | Status: AC
  Administered 2014-11-29: 1 g via INTRAVENOUS
  Filled 2014-11-29: qty 10

## 2014-11-29 MED ORDER — MORPHINE SULFATE 2 MG/ML IJ SOLN
2.0000 mg | INTRAMUSCULAR | Status: DC | PRN
  Administered 2014-11-29 (×2): 2 mg via INTRAVENOUS
  Filled 2014-11-29 (×2): qty 1

## 2014-11-29 MED ORDER — LABETALOL HCL 5 MG/ML IV SOLN
10.0000 mg | Freq: Once | INTRAVENOUS | Status: AC
  Administered 2014-11-29: 10 mg via INTRAVENOUS
  Filled 2014-11-29: qty 4

## 2014-11-29 MED ORDER — HEPARIN SODIUM (PORCINE) 1000 UNIT/ML IJ SOLN: 2500.0000 [IU] | Freq: Four times a day (QID) | INTRAMUSCULAR | Status: DC | PRN

## 2014-11-29 MED ORDER — DELFLEX-LC/2.5% DEXTROSE 394 MOSM/L IP SOLN: INTRAPERITONEAL | Status: DC

## 2014-11-29 MED ORDER — LABETALOL HCL 5 MG/ML IV SOLN
20.0000 mg | INTRAVENOUS | Status: DC | PRN
  Administered 2014-11-29 (×2): 20 mg via INTRAVENOUS
  Filled 2014-11-29 (×2): qty 4

## 2014-11-29 MED ORDER — LORAZEPAM 0.5 MG PO TABS: 0.5000 mg | ORAL_TABLET | Freq: Every evening | ORAL | Status: DC | PRN

## 2014-11-29 MED ORDER — HEPARIN (PORCINE) IN NACL 100-0.45 UNIT/ML-% IJ SOLN
950.0000 [IU]/h | INTRAMUSCULAR | Status: DC
  Administered 2014-11-29: 950 [IU]/h via INTRAVENOUS
  Filled 2014-11-29 (×2): qty 250

## 2014-11-29 NOTE — Progress Notes (Signed)
One time order from Dr. Roney Jaffe to draw labs from HD cath. HD blue port capped accessed, 10 ml of waste drawn, blood sample drawn, HD cath flushed with 25m of NS followed by Heparin 1.947m(1000u/ml), per priming volume in the blue port.  Cap cleaned, dead end cap placed on the blue port, both clamps taped, red "high dose heparin" sticker placed on the cathter.  Blood send to lab via tube station. [employer]

## 2014-11-29 NOTE — Progress Notes (Signed)
[facility] TEAM 1 - Stepdown/ICU TEAM Progress Note  Frank [name] EBR:[id number] DOB: July 06, 1966 DOA: 11/29/2014 PCP: Angelica Chessman, MD  Admit HPI / Brief Narrative: Frank [name] is a 49 y.o. BM PMHx ESRD on peritoneal dialysis with when necessary hemodialysis, diabetes mellitus, anasarca, hypertension, chronic pain syndrome, chronic noncompliance with medical regimen.  Appears the patient was seen in the ED for complaints of chest pain on 5/7, 5/8, and then 5/9-10 and admitted for chest on 5/10. The patient is presenting with complaints of chest pain. This started this afternoon. Pain is located in the epigastric region and progressively radiating to his left arm. He also has pain in his right arm and swelling of the right arm as well as right side of the face. He denies any trauma or injury. He mentions he is compliant with all his medications and take them regularly. He mentions he has taken his Saturday and Sunday dose of Coumadin. He mentions he does peritoneal dialysis regularly and he has done peritoneal dialysis and 11/28/2014. He mentions he was last hemodialyzed 2 weeks ago. He mentions he is in the process of switching to [facility] for continuation of his hemodialysis from [place].   HPI/Subjective: 5/10 A/O 4, NAD, CP/SOB resolved  Assessment/Plan: Accelerated hypertension -On nitro drip secondary to refractory HTN  -Hydralazine 100 mg TID -Labetalol IV 20 mg PRN  acute on chronic combined CHF/dilated cardiomyopathy/pericardial effusion. -Most likely secondary to noncompliance to medical regimen secondary to accelerated hypertension. -Echocardiogram pending  Pulmonary hypertension -See accelerated hypertension  Chest pain -Patient just admitted at 0500, troponins being cycled -Comparison of admission EKG to EKG from 11/17/2014 no significant change   Chronic anticoagulation for PE -Will DC anticoagulation as patient only had one incident of  hypercoagulability ~approximately 7 months ago (05/08/2014) acute PE -As outpatient Workup hypercoagulability panel. -Lower extremity venous duplex pending (low yield)  Noncompliance with medical treatment -Patient subtherapeutic INR for patient's poor compliance  Hyperkalemia. -Kayexalate 60 gm x 1  -Calcium gluconate 2 gm  -Peritoneal dialysis  Hypocalcemia -See hyperkalemia -Obtain magnesium level  Chronic anemia. -Continue close monitoring after starting him on heparin.   Code Status: FULL Family Communication: no family present at time of exam Disposition Plan: Nephrology    Consultants: Dr. Roney Jaffe (nephrology)   Procedure/Significant Events: 3/13 echocardiogram;- Left ventricle: moderate LVH. LVEF= 30% to 35%. Diffuse hypokinesis.  - Left atrium: severely dilated.- Right ventricle:  moderately dilated. - Right atrium: mildly dilated. CVP=15 mm Hg. - Pulmonary arteries: PA peak pressure: 40 mm Hg (S). - Pericardium, extracardiac: A small pericardial effusion with moderate collection at anterior base around right atrium.   Culture NA  Antibiotics: NA  DVT prophylaxis: Heparin/SCD   Devices   LINES / TUBES:  PD cath    Continuous Infusions: . nitroGLYCERIN 25 mcg/min (11/29/14 2035)    Objective: VITAL SIGNS: Temp: 98.3 F (36.8 C) (05/10 2000) Temp Source: Oral (05/10 2000) BP: 204/126 mmHg (05/10 2030) Pulse Rate: 77 (05/10 2030) SPO2; FIO2:   Intake/Output Summary (Last 24 hours) at 11/29/14 2144 Last data filed at 11/29/14 2020  Gross per 24 hour  Intake 804.17 ml  Output      0 ml  Net 804.17 ml     Exam: General: A/O 4, NAD, No acute respiratory distress Lungs: Clear to auscultation bilaterally without wheezes or crackles Cardiovascular: Regular rate and rhythm without murmur gallop or rub normal S1 and S2 Abdomen: Nontender, distended, soft, bowel sounds positive, no rebound,  no ascites, no appreciable  mass Extremities: No significant cyanosis, clubbing, or edema bilateral lower extremities, negative pain bilateral lower extremity, negative Homans sign, negative palpable cord  Data Reviewed: Basic Metabolic Panel:  Recent Labs Lab 11/26/14 2347 11/28/14 2122 11/29/14 1025 11/29/14 1430  NA 136 138 134*  --   K 6.1* 6.0* 6.3*  --   CL 98* 100* 100*  --   CO2 22 16* 18*  --   GLUCOSE 124* 79 111*  --   BUN 95* 106* 107*  --   CREATININE 20.88* 23.04* 22.98*  --   CALCIUM 7.4* 7.4* 7.2*  --   MG  --   --   --  1.9  PHOS  --   --  8.0*  --    Liver Function Tests:  Recent Labs Lab 11/26/14 2347 11/29/14 1025  AST 62*  --   ALT 19  --   ALKPHOS 110  --   BILITOT 0.8  --   PROT 6.7  --   ALBUMIN 2.5* 2.2*   No results for input(s): LIPASE, AMYLASE in the last 168 hours. No results for input(s): AMMONIA in the last 168 hours. CBC:  Recent Labs Lab 11/26/14 2347 11/28/14 2122 11/29/14 1025  WBC 5.7 5.7 5.2  NEUTROABS 4.3  --   --   HGB 8.0* 8.2* 7.4*  HCT 24.7* 24.8* 22.4*  MCV 84.9 82.7 83.9  PLT 199 220 205   Cardiac Enzymes: No results for input(s): CKTOTAL, CKMB, CKMBINDEX, TROPONINI in the last 168 hours. BNP (last 3 results)  Recent Labs  09/30/14 1556 11/17/14 0543 11/26/14 2352  BNP >4500.0* 2888.3* >4500.0*    ProBNP (last 3 results)  Recent Labs  01/02/14 0317 01/18/14 0048 04/15/14 0809  PROBNP >70000.0* >70000.0* >70000.0*    CBG: No results for input(s): GLUCAP in the last 168 hours.  Recent Results (from the past 240 hour(s))  MRSA PCR Screening     Status: None   Collection Time: 11/29/14  4:09 AM  Result Value Ref Range Status   MRSA by PCR NEGATIVE NEGATIVE Final    Comment:        The GeneXpert MRSA Assay (FDA approved for NASAL specimens only), is one component of a comprehensive MRSA colonization surveillance program. It is not intended to diagnose MRSA infection nor to guide or monitor treatment for MRSA  infections.      Studies:  Recent x-ray studies have been reviewed in detail by the Attending Physician  Scheduled Meds:  Scheduled Meds: . sodium chloride   Intravenous Once  . atorvastatin  40 mg Oral q1800  . carvedilol  25 mg Oral BID WC  . cinacalcet  30 mg Oral Q breakfast  . [START ON 11/30/2014] dialysis solution 2.5% low-MG/low-CA   Intraperitoneal Q24H  . [START ON 11/30/2014] dialysis solution 4.25% low-MG/low-CA   Intraperitoneal Q24H  . gentamicin cream  1 application Topical Daily  . hydrALAZINE  100 mg Oral TID  . minoxidil  2.5 mg Oral BID  . pantoprazole  40 mg Oral Daily  . predniSONE  5 mg Oral Q breakfast  . sevelamer carbonate  1,600 mg Oral TID WC    Time spent on care of this patient: 40 mins   Milanie Rosenfield, Geraldo Docker , MD  [facility] Office  [phone] Pager - [phone]  On-Call/Text Page:      [email or web address]      password [email or web address]  If 7PM-7AM, please contact night-coverage [email or web address] Password [email or web address] 11/29/2014, 9:44  PM   LOS: 0 days   Care during the described time interval was provided by me .  I have reviewed this patient's available data, including medical history, events of note, physical examination, radiology studies and test results as part of my evaluation  Dia Crawford, MD [phone] Pager

## 2014-11-29 NOTE — Progress Notes (Signed)
Charge RN called on 6E to make aware of dialysis for pt. RN stated he was waiting on a call back from dr Jonnie Finner and will be down as soon as orders are clarified.

## 2014-11-29 NOTE — ED Provider Notes (Signed)
CSN: [id number]     Arrival date & time 11/28/14  2113 History   First MD Initiated Contact with Patient 11/29/14 0138     This chart was scribed for Delora Fuel, MD by [name], ED Scribe. This patient was seen in room A09C/A09C and the patient's care was started 1:41 AM.   Chief Complaint  Patient presents with  . Chest Pain   The history is provided by the patient. No language interpreter was used.    HPI Comments: Frank [name] is a 50 y.o. male with a PMHx of HTN, DM, ESRD, renal insuffiencey, CHF, and anemia who presents to the Emergency Department complaining of waxing and waning, ongoing, progressively worsening chest pain x 2-3 days. Currently pain is rated 6/10. Pt also reports R shoulder pain rated 9/10 along with R arm swelling. Pain is exacerbated with deep breathing without any alleviating factors. No OTC medications or home remedies attempted prior to arrival. No recent diaphoresis, fever, chills, or cough. Pt is currently on peritoneal dialysis which he completes everyday at home. Frank [name] has been on dialysis consistently for 2 years since last failed transplant. Pt with known allergies to Ferrlecit and Darvocet.  Past Medical History  Diagnosis Date  . Hypertension   . Depression   . Complication of anesthesia     itching, sore throat  . Diabetes mellitus without complication     No history per patient, but remains under history as A1c would not be accurate given on dialysis  . Shortness of breath   . Anxiety   . ESRD (end stage renal disease)     due to HTN per patient, followed at [facility], s/p failed kidney transplant - dialysis Tue, Th, Sat  . Renal insufficiency   . CHF (congestive heart failure)   . Anemia    Past Surgical History  Procedure Laterality Date  . Kidney receipient  2006    failed and started HD in March 2014  . Capd insertion    . Capd removal    . Left heart catheterization with coronary angiogram N/A 09/02/2014    Procedure: LEFT HEART  CATHETERIZATION WITH CORONARY ANGIOGRAM;  Surgeon: [name], MD;  Location: [facility] CATH LAB;  Service: Cardiovascular;  Laterality: N/A;   History reviewed. No pertinent family history. History  Substance Use Topics  . Smoking status: Former Smoker -- 1.00 packs/day for 1 years    Types: Cigarettes  . Smokeless tobacco: Never Used     Comment: quit Jan 2014  . Alcohol Use: No    Review of Systems  Constitutional: Negative for fever, chills and diaphoresis.  Respiratory: Negative for cough and shortness of breath.   Cardiovascular: Positive for chest pain.  Gastrointestinal: Negative for nausea and vomiting.  Musculoskeletal: Positive for joint swelling.  Skin: Negative for rash.  Psychiatric/Behavioral: Negative for confusion.      Allergies  Ferrlecit and Darvocet  Home Medications   Prior to Admission medications   Medication Sig Start Date End Date Taking? Authorizing Provider  atorvastatin (LIPITOR) 40 MG tablet Take 1 tablet (40 mg total) by mouth daily at 6 PM. 09/02/14  Yes Barton Dubois, MD  carvedilol (COREG) 25 MG tablet Take 1 tablet (25 mg total) by mouth 2 (two) times daily with a meal. 10/10/14  Yes [name], DO  cinacalcet (SENSIPAR) 30 MG tablet Take 30 mg by mouth daily.   Yes Historical Provider, MD  diclofenac sodium (VOLTAREN) 1 % GEL Apply 2 g  topically 4 (four) times daily. 11/14/14  Yes Tresa Garter, MD  docusate sodium (COLACE) 100 MG capsule Take 1 capsule (100 mg total) by mouth 2 (two) times daily. 11/19/14  Yes [name], MD  doxercalciferol (HECTOROL) 4 MCG/2ML injection Inject 1.25 mLs (2.5 mcg total) into the vein Every Tuesday,Thursday,and Saturday with dialysis. 10/10/14  Yes [name], DO  hydrALAZINE (APRESOLINE) 25 MG tablet Take 100 mg by mouth 3 (three) times daily.    Yes Historical Provider, MD  levalbuterol [facility] HFA) 45 MCG/ACT inhaler Inhale 2 puffs into the lungs every 6 (six) hours as needed for wheezing or  shortness of breath. 11/19/14  Yes [name], MD  lisinopril (PRINIVIL,ZESTRIL) 10 MG tablet Take 1 tablet (10 mg total) by mouth daily. 10/10/14  Yes Jessica U Vann, DO  LORazepam (ATIVAN) 0.5 MG tablet Take 1 tablet (0.5 mg total) by mouth at bedtime as needed for anxiety. 11/19/14  Yes [name], MD  methocarbamol (ROBAXIN) 500 MG tablet Take 1 tablet (500 mg total) by mouth every 8 (eight) hours as needed for muscle spasms. 11/19/14  Yes [name], MD  minoxidil (LONITEN) 2.5 MG tablet Take 1 tablet (2.5 mg total) by mouth 2 (two) times daily. 10/10/14  Yes [name], DO  omeprazole (PRILOSEC) 20 MG capsule Take 20 mg by mouth daily. 07/01/13  Yes Historical Provider, MD  predniSONE (DELTASONE) 5 MG tablet Take 5 mg by mouth daily with breakfast.   Yes Historical Provider, MD  sevelamer carbonate (RENVELA) 800 MG tablet Take 1,600 mg by mouth 3 (three) times daily with meals. 12/27/13 12/27/14 Yes Historical Provider, MD  warfarin (COUMADIN) 4 MG tablet Take 8 mg by mouth daily.   Yes Historical Provider, MD  oxyCODONE (ROXICODONE) 5 MG immediate release tablet Take 1 tablet (5 mg total) by mouth every 6 (six) hours as needed for severe pain. Patient not taking: Reported on 11/28/2014 11/19/14   [name], MD   Triage Vitals: BP 197/127 mmHg  Pulse 85  Temp(Src) 98.8 F (37.1 C) (Oral)  Resp 19  Ht _0  (1.854 m)  Wt 180 lb (81.647 kg)  BMI 23.75 kg/m2  SpO2 100%   Physical Exam  Constitutional: He is oriented to person, place, and time. He appears well-developed and well-nourished.  HENT:  Head: Normocephalic and atraumatic.  Mild soft tissue swelling to R side of face  Eyes: EOM are normal. Pupils are equal, round, and reactive to light.  Neck: Normal range of motion. Neck supple. No JVD present.  Cardiovascular: Normal rate, regular rhythm, normal heart sounds and intact distal pulses.   No murmur heard. Pulmonary/Chest: Effort normal and breath sounds normal. He  has no wheezes. He has no rales. He exhibits no tenderness.  Dialysis access port present to R subclavian  Abdominal: Soft. Bowel sounds are normal. He exhibits no distension and no mass. There is no tenderness.  Peritoneal dialysis access port noted to RLQ  Musculoskeletal: Normal range of motion. He exhibits edema.  Moderate swelling to entire R arm 1 plus pitting edema of lower extremities  Lymphadenopathy:    He has no cervical adenopathy.  Neurological: He is alert and oriented to person, place, and time. No cranial nerve deficit. He exhibits normal muscle tone. Coordination normal.  Skin: Skin is warm and dry. No rash noted.  Psychiatric: He has a normal mood and affect. His behavior is normal. Judgment and thought content normal.  Nursing note  and vitals reviewed.   ED Course  Procedures (including critical care time)  DIAGNOSTIC STUDIES: Oxygen Saturation is 100% on RA, Normal by my interpretation.    COORDINATION OF CARE: 1:52 AM- Will give Morphine and Kayexalate. Will order CXR, CBC, BMP, i-stat troponin, protime-INR, and EKG. Discussed treatment plan with pt at bedside and pt agreed to plan.  a   Labs Review Results for orders placed or performed during the hospital encounter of 11/29/14  MRSA PCR Screening  Result Value Ref Range   MRSA by PCR NEGATIVE NEGATIVE  CBC  Result Value Ref Range   WBC 5.7 4.0 - 10.5 K/uL   RBC 3.00 (L) 4.22 - 5.81 MIL/uL   Hemoglobin 8.2 (L) 13.0 - 17.0 g/dL   HCT 24.8 (L) 39.0 - 52.0 %   MCV 82.7 78.0 - 100.0 fL   MCH 27.3 26.0 - 34.0 pg   MCHC 33.1 30.0 - 36.0 g/dL   RDW 15.0 11.5 - 15.5 %   Platelets 220 150 - 400 K/uL  Basic metabolic panel  Result Value Ref Range   Sodium 138 135 - 145 mmol/L   Potassium 6.0 (H) 3.5 - 5.1 mmol/L   Chloride 100 (L) 101 - 111 mmol/L   CO2 16 (L) 22 - 32 mmol/L   Glucose, Bld 79 70 - 99 mg/dL   BUN 106 (H) 6 - 20 mg/dL   Creatinine, Ser 23.04 (H) 0.61 - 1.24 mg/dL   Calcium 7.4 (L) 8.9 - 10.3  mg/dL   GFR calc non Af Amer 2 (L) >60 mL/min   GFR calc Af Amer 2 (L) >60 mL/min   Anion gap 22 (H) 5 - 15  Protime-INR (if pt is taking Coumadin)  Result Value Ref Range   Prothrombin Time 15.1 11.6 - 15.2 seconds   INR 1.18 0.00 - 1.49  I-stat troponin, ED  (not at [facility])  Result Value Ref Range   Troponin i, poc 0.06 0.00 - 0.08 ng/mL   Comment 3            Imaging Review Dg Chest 2 View  11/28/2014   CLINICAL DATA:  Chest abdomen and bilateral shoulder pain, onset tonight  EXAM: CHEST  2 VIEW  COMPARISON:  11/27/2014  FINDINGS: There is a right jugular central line with tip in the right atrium. There is marked unchanged cardiomegaly. Lungs are clear. There are no effusions. The pulmonary vasculature is normal.  IMPRESSION: Cardiomegaly.  No acute cardiopulmonary findings.   Electronically Signed   By: Andreas Newport M.D.   On: 11/28/2014 21:37     EKG Interpretation   Date/Time:  Monday Nov 28 2014 21:24:57 EDT Ventricular Rate:  80 PR Interval:  176 QRS Duration: 98 QT Interval:  398 QTC Calculation: 459 R Axis:   -28 Text Interpretation:  Normal sinus rhythm Nonspecific T wave abnormality  Abnormal ECG When compared with ECG of 11/26/2014, No significant change was  found Confirmed by [facility]  MD, Janis Cuffe ([id number]) on 11/29/2014 1:39:34 AM      MDM   Final diagnoses:  Chest pain, unspecified chest pain type  Pain of right upper arm  Swelling of joint of upper arm, right  End stage renal disease  Hyperkalemia    Pain in chest and right arm with swelling of the right arm. I am suspicious of a thrombosis related to his dialysis central line catheters. Old records are reviewed and he had been seen in the ED 2 days ago with  chest pain of uncertain cause. I discussed case with radiologist who recommended venous ultrasound to evaluate this. Unfortunately, the vascular lab but does that is not available until after 8 AM. Case is discussed with Dr. Posey Pronto of [facility]  who agrees to admit the patient.  I personally performed the services described in this documentation, which was scribed in my presence. The recorded information has been reviewed and is accurate.      Delora Fuel, MD 43/60/16 [id number]

## 2014-11-29 NOTE — Progress Notes (Signed)
Dr. Sherral Hammers paged about increasing BP 213/147 (170) despite bp medications given and PRN labetalol. Currently awaiting new orders.

## 2014-11-29 NOTE — Consult Note (Addendum)
Renal Service Consult Note [name]  [name] 11/29/2014 [name] Requesting Physician:  Dr Sherral Hammers  Reason for Consult:  ESRD pt on PD w SOB, htn urgency HPI: The patient is a 49 y.o. year-old with hx of ESRD. Did PD 5 years then renal transplant 2004 which failed and started back on 2014 followed by [facility] doctors getting HD in [facility]. He requested transition to PD and had PD cath placed Mar '16.  He was to start PD about 10 days ago and did some PD at home, then he says his [facility] called and said they wanted him to go back on HD.  He has only done about 3 days maximum in a row pt says since "starting" PD 10-14 days ago.  Also pt say he has been having transportation troubles and hasn't been able to get all his PD fluid from [place] . He wants to transfer his care to [facility] in [place], he requested this from the [place] group and they reportedly d/w [facility] doctors and the patient will be accepted by [facility] if he remains stable on PD for 60 days according to patient.   He presented to ED with CP, SOB, ^^BP, typical presentation for him which resolved with HD.  He is in no distress right now. Had heart cath here in last 6 mos w/o sig CAD.  K 6.3, EKG ok.  Wants to eat, no n/v/d, no abd pain. R shoulder hurts "since the heart cath".      Chart review: 2/14 - hx renal tx 2004 [facility] w a/c renal failure, SOB, vol excess, HTN, depression 12/14 - hyperkalemia, ESRD on HD at [name], htn, gout, n/v 2/15 - chest pain, ruled out; esrd on HD, immunocompromised on meds for tx that failed 2014 2/14 - htn emergency, acute resp failure/ pulm edema, left ama 3/15 - hyperK, CHF rx'd with HD 5/15 - chest pain/ sob/ pulm edema rx with HD 7/15 - acute resp failure/ pulm edema, htn urgency; had partial HD and left AMA 9/15 - HA's/ ^^BP, admitted and resumed on multiple BP meds, left AMA 9/15 - HA's , ^BP ran out of clon/ minoxidil; rx with HD, pain meds 10/15 - back/ chest pain 3 days,  SOB, pleuritic CP > found to have PE LUL by CTA of chest. Rx lovenox/ coumadin, INR 2.8 at dc. Not NOAC candidate d/t esrd  2/16 - chest pain > CT angio neg for PE, ruled out, echo EF 40-45% 3/16 - HTN'sive urgency, ESRD on HD, HTN, gastroenteritis, a/c chf and acute pulm edema 3/16 - chest pain > myoview showed fixed defect inf wall, EF 35%. Left heart cath 2/12 showed minimal CAD. Repeat echo w EF 30-35%.  4/28- 11/19/14 > ESRD pt on PD now presented with swollen legs/ arms, SOB/ n/v for 2 days. Rec'd HD as inpatient x 2 and improved. OK for dc.      ROS  no HA  no rash  Past Medical History  Past Medical History  Diagnosis Date  . Hypertension   . Depression   . Complication of anesthesia     itching, sore throat  . Diabetes mellitus without complication     No history per patient, but remains under history as A1c would not be accurate given on dialysis  . Shortness of breath   . Anxiety   . ESRD (end stage renal disease)     due to HTN per patient, followed at [facility], s/p failed  kidney transplant - dialysis Tue, Th, Sat  . Renal insufficiency   . CHF (congestive heart failure)   . Anemia    Past Surgical History  Past Surgical History  Procedure Laterality Date  . Kidney receipient  2006    failed and started HD in March 2014  . Capd insertion    . Capd removal    . Left heart catheterization with coronary angiogram N/A 09/02/2014    Procedure: LEFT HEART CATHETERIZATION WITH CORONARY ANGIOGRAM;  Surgeon: [name], MD;  Location: [facility] CATH LAB;  Service: Cardiovascular;  Laterality: N/A;   Family History History reviewed. No pertinent family history. Social History  reports that he has quit smoking. His smoking use included Cigarettes. He has a 1 pack-year smoking history. He has never used smokeless tobacco. He reports that he does not drink alcohol or use illicit drugs. Allergies  Allergies  Allergen Reactions  . Ferrlecit [Na Ferric Gluc Cplx In Sucrose]  Shortness Of Breath, Swelling and Other (See Comments)    Swelling in throat  . Darvocet [Propoxyphene N-Acetaminophen] Hives   Home medications Prior to Admission medications   Medication Sig Start Date End Date Taking? Authorizing Provider  atorvastatin (LIPITOR) 40 MG tablet Take 1 tablet (40 mg total) by mouth daily at 6 PM. 09/02/14  Yes Barton Dubois, MD  carvedilol (COREG) 25 MG tablet Take 1 tablet (25 mg total) by mouth 2 (two) times daily with a meal. 10/10/14  Yes [name], DO  cinacalcet (SENSIPAR) 30 MG tablet Take 30 mg by mouth daily.   Yes Historical Provider, MD  diclofenac sodium (VOLTAREN) 1 % GEL Apply 2 g topically 4 (four) times daily. 11/14/14  Yes Tresa Garter, MD  docusate sodium (COLACE) 100 MG capsule Take 1 capsule (100 mg total) by mouth 2 (two) times daily. 11/19/14  Yes [name], MD  doxercalciferol (HECTOROL) 4 MCG/2ML injection Inject 1.25 mLs (2.5 mcg total) into the vein Every Tuesday,Thursday,and Saturday with dialysis. 10/10/14  Yes [name], DO  hydrALAZINE (APRESOLINE) 25 MG tablet Take 100 mg by mouth 3 (three) times daily.    Yes Historical Provider, MD  levalbuterol [name] HFA) 45 MCG/ACT inhaler Inhale 2 puffs into the lungs every 6 (six) hours as needed for wheezing or shortness of breath. 11/19/14  Yes [name], MD  lisinopril (PRINIVIL,ZESTRIL) 10 MG tablet Take 1 tablet (10 mg total) by mouth daily. 10/10/14  Yes Jessica U Vann, DO  LORazepam (ATIVAN) 0.5 MG tablet Take 1 tablet (0.5 mg total) by mouth at bedtime as needed for anxiety. 11/19/14  Yes [name], MD  methocarbamol (ROBAXIN) 500 MG tablet Take 1 tablet (500 mg total) by mouth every 8 (eight) hours as needed for muscle spasms. 11/19/14  Yes [name], MD  minoxidil (LONITEN) 2.5 MG tablet Take 1 tablet (2.5 mg total) by mouth 2 (two) times daily. 10/10/14  Yes [name], DO  omeprazole (PRILOSEC) 20 MG capsule Take 20 mg by mouth daily. 07/01/13  Yes  Historical Provider, MD  predniSONE (DELTASONE) 5 MG tablet Take 5 mg by mouth daily with breakfast.   Yes Historical Provider, MD  sevelamer carbonate (RENVELA) 800 MG tablet Take 1,600 mg by mouth 3 (three) times daily with meals. 12/27/13 12/27/14 Yes Historical Provider, MD  warfarin (COUMADIN) 4 MG tablet Take 8 mg by mouth daily.   Yes Historical Provider, MD  oxyCODONE (ROXICODONE) 5 MG immediate release tablet Take 1 tablet (  5 mg total) by mouth every 6 (six) hours as needed for severe pain. Patient not taking: Reported on 11/28/2014 11/19/14   [name], MD   Liver Function Tests  Recent Labs Lab 11/26/14 2347 11/29/14 1025  AST 62*  --   ALT 19  --   ALKPHOS 110  --   BILITOT 0.8  --   PROT 6.7  --   ALBUMIN 2.5* 2.2*   No results for input(s): LIPASE, AMYLASE in the last 168 hours. CBC  Recent Labs Lab 11/26/14 2347 11/28/14 2122 11/29/14 1025  WBC 5.7 5.7 5.2  NEUTROABS 4.3  --   --   HGB 8.0* 8.2* 7.4*  HCT 24.7* 24.8* 22.4*  MCV 84.9 82.7 83.9  PLT 199 220 048   Basic Metabolic Panel  Recent Labs Lab 11/26/14 2347 11/28/14 2122 11/29/14 1025  NA 136 138 134*  K 6.1* 6.0* 6.3*  CL 98* 100* 100*  CO2 22 16* 18*  GLUCOSE 124* 79 111*  BUN 95* 106* 107*  CREATININE 20.88* 23.04* 22.98*  CALCIUM 7.4* 7.4* 7.2*  PHOS  --   --  8.0*    Filed Vitals:   11/29/14 1015 11/29/14 1030 11/29/14 1045 11/29/14 1109  BP: 168/111 169/114 172/110 167/124  Pulse: 75 73 69 62  Temp:    98.3 F (36.8 C)  TempSrc:    Oral  Resp: _0 Height:      Weight:      SpO2: 99% 100% 99% 99%   Exam Alert, no distress, BP 180/100, +uremic fetor No rash, cyanosis or gangrene Sclera anicteric, throat clear +jvd Chest faint rales at bases RRR faint SEM on RG Abd soft, NTND, PD cath RLQ IJ R chest tunneled HD cath LE 1+ pitting edema bilat Neuro is alert, no asterixis, nf, Ox 3  HD: 4 exchanges overnight of 2000 cc each, keeps 2000 in as day bag, no pause/  mid-day exchange   Assessment: 1. HTN urgency/ vol overload/ SOB - CXR clear, on multiple BP meds at home 2. Uremia / underdialysis - has not been on a regular schedule of either PD or HD recently, has transportation problems getting to [place], is trying to transfer to [place] [facility].  Needs more aggressive PD Rx i think, will see if his numbers will come into range here in the hospital.   3. Hyperkalemia no ekg changes 4. Hx of LUL PE Oct 2015 - has had 6 mos of anticoagulation, should not need any more at this time. Would dc anticoagulation, will d/w primary 5. Hx failed renal tx - on low dose prednisone 5/d 6. Pain control - told pt he should stay off narcotics/ sedative, etc. , if he wants to make a good impression on the kidney doctors in [place] and [place], he is agreeable. Have ordered a low-dose bid prn oxy order that's all for now.    Plan- plan aggressive PD here in hospital, see if uremia/ azotemia will resolve w PD alone. Use HD if he gets worse or doesn't get better. DC coumadin altogether. Rx hyperK w PD.  Will follow, have d/w primary. Renal diet.   Kelly Splinter MD (pgr) [phone]    ([phone] 11/29/2014, 12:43 PM

## 2014-11-29 NOTE — Progress Notes (Signed)
CRITICAL VALUE ALERT  Critical value received:  K+ 6.3  Date of notification:  11/29/14  Time of notification:  1137  Critical value read back:Yes.    Nurse who received alert:  Ander Purpura RN  MD notified (1st page):  Wood[place]  Time of first page:  50  MD notified (2nd page):  Time of second page:  Responding MD:  woods  Time MD responded:  [id number]

## 2014-11-29 NOTE — Progress Notes (Signed)
MD paged and made aware of lab inability to stick pt r/t PIV in left hand and right arm restricted extremity. MD stated okay to stick pt in foot for lab draws.

## 2014-11-29 NOTE — H&P (Signed)
[facility] History and Physical  Patient: Frank [name]  MRN: [id number]  DOB: 1965/11/05  DOS: the patient was seen and examined on 11/29/2014 PCP: Angelica Chessman, MD  Referring physician: Dr. [name] Chief Complaint: Chest pain  HPI: Frank [name] is a 49 y.o. male with Past medical history of ESRD on peritoneal dialysis with when necessary hemodialysis, diabetes mellitus, anasarca, hypertension, chronic pain syndrome, chronic noncompliance with medical regimen. The patient is presenting with complaints of chest pain. This started this afternoon. Pain is located in the epigastric region and progressively radiating to his left arm. He also has pain in his right arm and swelling of the right arm as well as right side of the face. He denies any trauma or injury. He mentions he is compliant with all his medications and take them regularly. He mentions he has taken his Saturday and Sunday dose of Coumadin. He mentions he does peritoneal dialysis regularly and he has done peritoneal dialysis and 11/28/2014. He mentions he was last hemodialyzed 2 weeks ago. He mentions he is in the process of switching to [facility] for continuation of his hemodialysis from [place].  The patient is coming from home. And at his baseline independent for most of his ADL.  Review of Systems: as mentioned in the history of present illness.  A comprehensive review of the other systems is negative.  Past Medical History  Diagnosis Date  . Hypertension   . Depression   . Complication of anesthesia     itching, sore throat  . Diabetes mellitus without complication     No history per patient, but remains under history as A1c would not be accurate given on dialysis  . Shortness of breath   . Anxiety   . ESRD (end stage renal disease)     due to HTN per patient, followed at [facility], s/p failed kidney transplant - dialysis Tue, Th, Sat  . Renal insufficiency   . CHF (congestive heart failure)   .  Anemia    Past Surgical History  Procedure Laterality Date  . Kidney receipient  2006    failed and started HD in March 2014  . Capd insertion    . Capd removal    . Left heart catheterization with coronary angiogram N/A 09/02/2014    Procedure: LEFT HEART CATHETERIZATION WITH CORONARY ANGIOGRAM;  Surgeon: [name], MD;  Location: [facility] CATH LAB;  Service: Cardiovascular;  Laterality: N/A;   Social History:  reports that he has quit smoking. His smoking use included Cigarettes. He has a 1 pack-year smoking history. He has never used smokeless tobacco. He reports that he does not drink alcohol or use illicit drugs.  Allergies  Allergen Reactions  . Ferrlecit [Na Ferric Gluc Cplx In Sucrose] Shortness Of Breath, Swelling and Other (See Comments)    Swelling in throat  . Darvocet [Propoxyphene N-Acetaminophen] Hives    History reviewed. No pertinent family history.  Prior to Admission medications   Medication Sig Start Date End Date Taking? Authorizing Provider  atorvastatin (LIPITOR) 40 MG tablet Take 1 tablet (40 mg total) by mouth daily at 6 PM. 09/02/14  Yes Barton Dubois, MD  carvedilol (COREG) 25 MG tablet Take 1 tablet (25 mg total) by mouth 2 (two) times daily with a meal. 10/10/14  Yes [name], DO  cinacalcet (SENSIPAR) 30 MG tablet Take 30 mg by mouth daily.   Yes Historical Provider, MD  diclofenac sodium (VOLTAREN) 1 % GEL Apply 2 g topically  4 (four) times daily. 11/14/14  Yes Tresa Garter, MD  docusate sodium (COLACE) 100 MG capsule Take 1 capsule (100 mg total) by mouth 2 (two) times daily. 11/19/14  Yes [name], MD  doxercalciferol (HECTOROL) 4 MCG/2ML injection Inject 1.25 mLs (2.5 mcg total) into the vein Every Tuesday,Thursday,and Saturday with dialysis. 10/10/14  Yes [name], DO  hydrALAZINE (APRESOLINE) 25 MG tablet Take 100 mg by mouth 3 (three) times daily.    Yes Historical Provider, MD  levalbuterol [facility] HFA) 45 MCG/ACT inhaler Inhale  2 puffs into the lungs every 6 (six) hours as needed for wheezing or shortness of breath. 11/19/14  Yes [name], MD  lisinopril (PRINIVIL,ZESTRIL) 10 MG tablet Take 1 tablet (10 mg total) by mouth daily. 10/10/14  Yes Jessica U Vann, DO  LORazepam (ATIVAN) 0.5 MG tablet Take 1 tablet (0.5 mg total) by mouth at bedtime as needed for anxiety. 11/19/14  Yes [name], MD  methocarbamol (ROBAXIN) 500 MG tablet Take 1 tablet (500 mg total) by mouth every 8 (eight) hours as needed for muscle spasms. 11/19/14  Yes [name], MD  minoxidil (LONITEN) 2.5 MG tablet Take 1 tablet (2.5 mg total) by mouth 2 (two) times daily. 10/10/14  Yes [name], DO  omeprazole (PRILOSEC) 20 MG capsule Take 20 mg by mouth daily. 07/01/13  Yes Historical Provider, MD  predniSONE (DELTASONE) 5 MG tablet Take 5 mg by mouth daily with breakfast.   Yes Historical Provider, MD  sevelamer carbonate (RENVELA) 800 MG tablet Take 1,600 mg by mouth 3 (three) times daily with meals. 12/27/13 12/27/14 Yes Historical Provider, MD  warfarin (COUMADIN) 4 MG tablet Take 8 mg by mouth daily.   Yes Historical Provider, MD  oxyCODONE (ROXICODONE) 5 MG immediate release tablet Take 1 tablet (5 mg total) by mouth every 6 (six) hours as needed for severe pain. Patient not taking: Reported on 11/28/2014 11/19/14   [name], MD    Physical Exam: Filed Vitals:   11/29/14 0415 11/29/14 0430 11/29/14 0445 11/29/14 0500  BP: 238/159 204/121 191/131 206/141  Pulse:  80 90 88  Temp:      TempSrc:      Resp: _0 Height:      Weight:      SpO2:  100% 99% 99%    General: Alert, Awake and Oriented to Time, Place and Person. Appear in mild distress Eyes: PERRL ENT: Oral Mucosa clear moist. Neck: Difficult to assess JVD Cardiovascular: S1 and S2 Present, aortic systolic Murmur, Peripheral Pulses Present Respiratory: Bilateral Air entry equal and Decreased,  Basal Crackles, no wheezes Abdomen: Bowel Sound present, Soft  and non tender Skin: no Rash Extremities: Right upper extremity swelling, bilateral Pedal edema, no calf tenderness Neurologic: Grossly no focal neuro deficit.  Labs on Admission:  CBC:  Recent Labs Lab 11/26/14 2347 11/28/14 2122  WBC 5.7 5.7  NEUTROABS 4.3  --   HGB 8.0* 8.2*  HCT 24.7* 24.8*  MCV 84.9 82.7  PLT 199 220    CMP     Component Value Date/Time   NA 138 11/28/2014 2122   K 6.0* 11/28/2014 2122   CL 100* 11/28/2014 2122   CO2 16* 11/28/2014 2122   GLUCOSE 79 11/28/2014 2122   BUN 106* 11/28/2014 2122   CREATININE 23.04* 11/28/2014 2122   CALCIUM 7.4* 11/28/2014 2122   PROT 6.7 11/26/2014 2347   ALBUMIN 2.5* 11/26/2014 2347  AST 62* 11/26/2014 2347   ALT 19 11/26/2014 2347   ALKPHOS 110 11/26/2014 2347   BILITOT 0.8 11/26/2014 2347   GFRNONAA 2* 11/28/2014 2122   GFRAA 2* 11/28/2014 2122    No results for input(s): LIPASE, AMYLASE in the last 168 hours.  No results for input(s): CKTOTAL, CKMB, CKMBINDEX, TROPONINI in the last 168 hours. BNP (last 3 results)  Recent Labs  09/30/14 1556 11/17/14 0543 11/26/14 2352  BNP >4500.0* 2888.3* >4500.0*    ProBNP (last 3 results)  Recent Labs  01/02/14 0317 01/18/14 0048 04/15/14 0809  PROBNP >70000.0* >70000.0* >70000.0*     Radiological Exams on Admission: Dg Chest 2 View  11/28/2014   CLINICAL DATA:  Chest abdomen and bilateral shoulder pain, onset tonight  EXAM: CHEST  2 VIEW  COMPARISON:  11/27/2014  FINDINGS: There is a right jugular central line with tip in the right atrium. There is marked unchanged cardiomegaly. Lungs are clear. There are no effusions. The pulmonary vasculature is normal.  IMPRESSION: Cardiomegaly.  No acute cardiopulmonary findings.   Electronically Signed   By: Andreas Newport M.D.   On: 11/28/2014 21:37   EKG: Independently reviewed. normal sinus rhythm, nonspecific ST and T waves changes.  Assessment/Plan Principal Problem:   Accelerated hypertension Active  Problems:   Anemia of chronic kidney failure   Chronic anticoagulation   Subtherapeutic international normalized ratio (INR)   Acute on chronic combined systolic and diastolic CHF (congestive heart failure)   Diabetes type 2, uncontrolled   Chest pain   Anasarca   1. Accelerated hypertension The patient is presenting with complaints of chest pain. He appears to be significantly hyper-tension. This is most likely secondary to noncompliance as he has been admitted in the past for the similar complaints. Currently I will continue his home medication use when necessary hydralazine as well as nitroglycerin ointment. He'll be admitted in stepdown unit. Follow serial troponin. Initial EKG does have some ST-T wave changes related to closely monitor. Patient remains nothing by mouth.  2. Chronic anticoagulation, subtherapeutic INR. Noncompliance with medical regimen. History of PE. Patient also has right upper extremity swelling. Concern for DVT cannot be ruled out. We'll obtain ultrasound of upper extremity Doppler in the morning. He'll be placed on heparin with bridging for warfarin. Patient is compliance remains a question for long-term anticoagulation.  3. Possible acute on chronic combined CHF. Most likely secondary to noncompliance to medical regimen secondary to accelerated hypertension. We will discuss with nephrology in the morning for continuation of hemodialysis. At present his dialysis catheter does appear to be significantly ill kempt position. We'll hold off on use of the upper extremity.  4. Hyperkalemia. Patient has received Kayexalate and I will give him good condition gluconate. Monitor in stepdown unit.  5. Chronic anemia. Continue close monitoring after starting him on heparin.  Advance goals of care discussion: Full code   DVT Prophylaxis:  therapeutic anticoagulation. Nutrition: Nothing by mouth except medications  Disposition: Admitted as inpatient, step-down  unit.  Author: Berle Mull, MD [facility] Pager: [phone] 11/29/2014  If 7PM-7AM, please contact night-coverage [email or web address] Password [email or web address]

## 2014-11-29 NOTE — Progress Notes (Signed)
ANTICOAGULATION CONSULT NOTE - Initial Consult  Pharmacy Consult for heparin Indication: chest pain/ACS  Allergies  Allergen Reactions  . Ferrlecit [Na Ferric Gluc Cplx In Sucrose] Shortness Of Breath, Swelling and Other (See Comments)    Swelling in throat  . Darvocet [Propoxyphene N-Acetaminophen] Hives    Patient Measurements: Height: 6' 1" (185.4 cm) Weight: 180 lb (81.647 kg) IBW/kg (Calculated) : 79.9  Vital Signs: Temp: 98.8 F (37.1 C) (05/09 2120) Temp Source: Oral (05/09 2120) BP: 197/131 mmHg (05/10 0245) Pulse Rate: 82 (05/10 0245)  Labs:  Recent Labs  11/26/14 2347 11/28/14 2122  HGB 8.0* 8.2*  HCT 24.7* 24.8*  PLT 199 220  LABPROT  --  15.1  INR  --  1.18  CREATININE 20.88* 23.04*    Estimated Creatinine Clearance: 4.3 mL/min (by C-G formula based on Cr of 23.04).   Medical History: Past Medical History  Diagnosis Date  . Hypertension   . Depression   . Complication of anesthesia     itching, sore throat  . Diabetes mellitus without complication     No history per patient, but remains under history as A1c would not be accurate given on dialysis  . Shortness of breath   . Anxiety   . ESRD (end stage renal disease)     due to HTN per patient, followed at [facility], s/p failed kidney transplant - dialysis Tue, Th, Sat  . Renal insufficiency   . CHF (congestive heart failure)   . Anemia      Assessment: 49yo male c/o CP x2d without improvement since being seen in ED 2d ago, to begin heparin.  Goal of Therapy:  Heparin level 0.3-0.7 units/ml Monitor platelets by anticoagulation protocol: Yes   Plan:  Will give heparin 2000 units IV bolus followed by gtt at 950 units/hr and monitor heparin levels and CBC.  Wynona Neat, PharmD, BCPS  11/29/2014,3:20 AM

## 2014-11-29 NOTE — Progress Notes (Signed)
Utilization review completed. Misk Galentine, RN, BSN. 

## 2014-11-29 NOTE — Progress Notes (Signed)
MD notified

## 2014-11-29 NOTE — Progress Notes (Signed)
Dr. Jonnie Finner at bedside, was given a verbal order to D/C calcium gluconate and kayexalate. MD states pt will start PD within the next couple hours and expects to be able to lower his potassium levels that way. Will continue to monitor patient. Pt signed blood consent as ordered per Dr. Sherral Hammers. IV team currently at bedside.

## 2014-11-30 ENCOUNTER — Inpatient Hospital Stay (HOSPITAL_COMMUNITY): Payer: Medicare Other

## 2014-11-30 DIAGNOSIS — M79609 Pain in unspecified limb: Secondary | ICD-10-CM

## 2014-11-30 DIAGNOSIS — J918 Pleural effusion in other conditions classified elsewhere: Secondary | ICD-10-CM

## 2014-11-30 DIAGNOSIS — I10 Essential (primary) hypertension: Secondary | ICD-10-CM

## 2014-11-30 LAB — RENAL FUNCTION PANEL
ALBUMIN: 2 g/dL — AB (ref 3.5–5.0)
ANION GAP: 17 — AB (ref 5–15)
BUN: 90 mg/dL — ABNORMAL HIGH (ref 6–20)
CALCIUM: 7.4 mg/dL — AB (ref 8.9–10.3)
CO2: 21 mmol/L — ABNORMAL LOW (ref 22–32)
Chloride: 98 mmol/L — ABNORMAL LOW (ref 101–111)
Creatinine, Ser: 20.27 mg/dL — ABNORMAL HIGH (ref 0.61–1.24)
GFR calc non Af Amer: 2 mL/min — ABNORMAL LOW (ref >60)
GFR, EST AFRICAN AMERICAN: 3 mL/min — AB (ref >60)
Glucose, Bld: 162 mg/dL — ABNORMAL HIGH (ref 70–99)
PHOSPHORUS: 7.3 mg/dL — AB (ref 2.5–4.6)
POTASSIUM: 4.5 mmol/L (ref 3.5–5.1)
Sodium: 136 mmol/L (ref 135–145)

## 2014-11-30 LAB — IRON AND TIBC
IRON: 85 ug/dL (ref 45–182)
Saturation Ratios: 42 % — ABNORMAL HIGH (ref 17.9–39.5)
TIBC: 203 ug/dL — AB (ref 250–450)
UIBC: 118 ug/dL

## 2014-11-30 LAB — FERRITIN: Ferritin: 82 ng/mL (ref 24–336)

## 2014-11-30 MED ORDER — HEPARIN SODIUM (PORCINE) 1000 UNIT/ML IJ SOLN
1900.0000 [IU] | INTRAMUSCULAR | Status: DC | PRN
  Administered 2014-12-01 (×2): 1900 [IU] via INTRAVENOUS
  Filled 2014-11-30: qty 1.9
  Filled 2014-11-30 (×2): qty 2

## 2014-11-30 MED ORDER — WARFARIN SODIUM 10 MG PO TABS
10.0000 mg | ORAL_TABLET | Freq: Once | ORAL | Status: AC
  Filled 2014-11-30: qty 1

## 2014-11-30 MED ORDER — METHOCARBAMOL 500 MG PO TABS
500.0000 mg | ORAL_TABLET | Freq: Three times a day (TID) | ORAL | Status: DC | PRN
  Filled 2014-11-30: qty 1

## 2014-11-30 MED ORDER — CAMPHOR-MENTHOL 0.5-0.5 % EX LOTN
TOPICAL_LOTION | CUTANEOUS | Status: DC | PRN
  Administered 2014-11-30: 1 via TOPICAL
  Filled 2014-11-30 (×2): qty 222

## 2014-11-30 MED ORDER — WARFARIN - PHARMACIST DOSING INPATIENT
Freq: Every day | Status: DC
  Administered 2014-12-01 – 2014-12-02 (×2)

## 2014-11-30 MED ORDER — HEPARIN (PORCINE) IN NACL 100-0.45 UNIT/ML-% IJ SOLN
1450.0000 [IU]/h | INTRAMUSCULAR | Status: DC
  Filled 2014-11-30 (×3): qty 250

## 2014-11-30 MED ORDER — ALBUTEROL SULFATE (2.5 MG/3ML) 0.083% IN NEBU: 3.0000 mL | INHALATION_SOLUTION | RESPIRATORY_TRACT | Status: DC | PRN

## 2014-11-30 MED ORDER — HEPARIN BOLUS VIA INFUSION
4000.0000 [IU] | Freq: Once | INTRAVENOUS | Status: AC
  Administered 2014-11-30: 4000 [IU] via INTRAVENOUS
  Filled 2014-11-30: qty 4000

## 2014-11-30 MED ORDER — DOCUSATE SODIUM 100 MG PO CAPS
100.0000 mg | ORAL_CAPSULE | Freq: Two times a day (BID) | ORAL | Status: DC
  Administered 2014-11-30 – 2014-12-05 (×8): 100 mg via ORAL
  Filled 2014-11-30 (×12): qty 1

## 2014-11-30 MED ORDER — DARBEPOETIN ALFA 150 MCG/0.3ML IJ SOSY
150.0000 ug | PREFILLED_SYRINGE | INTRAMUSCULAR | Status: DC
  Administered 2014-11-30: 150 ug via SUBCUTANEOUS
  Filled 2014-11-30 (×2): qty 0.3

## 2014-11-30 MED ORDER — LISINOPRIL 10 MG PO TABS
10.0000 mg | ORAL_TABLET | Freq: Every day | ORAL | Status: DC
  Administered 2014-12-01 – 2014-12-04 (×4): 10 mg via ORAL
  Filled 2014-11-30 (×5): qty 1

## 2014-11-30 NOTE — Progress Notes (Signed)
Subjective:  No complaints this AM- says he feels better- BP high- had to go up on NTG drip- PD seems to be going OK_ 1800 removed so far?   Objective Vital signs in last 24 hours: Filed Vitals:   11/30/14 0530 11/30/14 0600 11/30/14 0630 11/30/14 0700  BP: 168/92 167/116 169/122 169/108  Pulse: 76 78 77 77  Temp:      TempSrc:      Resp: _0 Height:      Weight:      SpO2: 96% 94% 99% 98%   Weight change: 5.653 kg (12 lb 7.4 oz)  Intake/Output Summary (Last 24 hours) at 11/30/14 0730 Last data filed at 11/30/14 0600  Gross per 24 hour  Intake 877.67 ml  Output      0 ml  Net 877.67 ml    Assessment/ Plan: Pt is a 49 y.o. yo male who was admitted on 11/29/2014 with ESRD noncompliance with PD/malignant HTN/volume overload and uremia  Assessment/Plan: 1. ESRD- plan is for regular PD in controlled environment for 48-72 hours to see if uremia and volume are controllable on this regimen.  Patient is anxious to do PD as an OP.  It is noted that patient wishes to change to [place].  I really do not see that happening as an aside unless he were to change into a completely different person (weekly ER visits, mult issues with noncompliance) 2. HTN/volume- is overloaded but PD has helped with that - almost 2 liters overnight using 2.5 and 4.25 % fluid.  Also on coreg 25 BID, minoxidil 2.5 BID, hydralazine 100 TID- PRN IV labetalol and ntg drip  - no changes right now- need time for meds to be reintroduced into his system- may want to exchange hydralazine for one time a day ACE for compliance purposes ? 3. Anemia- severe- need to check iron stores and give aranesp 4. Secondary hyperparathyroidism- on sensipar-on sevelamer as well 5. Hyperkalemia- hopefully is improved with PD- checking labs today to eval   Joannie Medine A    Labs: Basic Metabolic Panel:  Recent Labs Lab 11/26/14 2347 11/28/14 2122 11/29/14 1025  NA 136 138 134*  K 6.1* 6.0* 6.3*  CL 98* 100*  100*  CO2 22 16* 18*  GLUCOSE 124* 79 111*  BUN 95* 106* 107*  CREATININE 20.88* 23.04* 22.98*  CALCIUM 7.4* 7.4* 7.2*  PHOS  --   --  8.0*   Liver Function Tests:  Recent Labs Lab 11/26/14 2347 11/29/14 1025  AST 62*  --   ALT 19  --   ALKPHOS 110  --   BILITOT 0.8  --   PROT 6.7  --   ALBUMIN 2.5* 2.2*   No results for input(s): LIPASE, AMYLASE in the last 168 hours. No results for input(s): AMMONIA in the last 168 hours. CBC:  Recent Labs Lab 11/26/14 2347 11/28/14 2122 11/29/14 1025  WBC 5.7 5.7 5.2  NEUTROABS 4.3  --   --   HGB 8.0* 8.2* 7.4*  HCT 24.7* 24.8* 22.4*  MCV 84.9 82.7 83.9  PLT 199 220 205   Cardiac Enzymes: No results for input(s): CKTOTAL, CKMB, CKMBINDEX, TROPONINI in the last 168 hours. CBG: No results for input(s): GLUCAP in the last 168 hours.  Iron Studies: No results for input(s): IRON, TIBC, TRANSFERRIN, FERRITIN in the last 72 hours. Studies/Results: Dg Chest 2 View  11/28/2014   CLINICAL DATA:  Chest abdomen and bilateral shoulder pain, onset tonight  EXAM:  CHEST  2 VIEW  COMPARISON:  11/27/2014  FINDINGS: There is a right jugular central line with tip in the right atrium. There is marked unchanged cardiomegaly. Lungs are clear. There are no effusions. The pulmonary vasculature is normal.  IMPRESSION: Cardiomegaly.  No acute cardiopulmonary findings.   Electronically Signed   By: Andreas Newport M.D.   On: 11/28/2014 21:37   Medications: Infusions: . nitroGLYCERIN 110 mcg/min (11/30/14 0600)    Scheduled Medications: . sodium chloride   Intravenous Once  . atorvastatin  40 mg Oral q1800  . carvedilol  25 mg Oral BID WC  . cinacalcet  30 mg Oral Q breakfast  . dialysis solution 2.5% low-MG/low-CA   Intraperitoneal Q24H  . dialysis solution 4.25% low-MG/low-CA   Intraperitoneal Q24H  . gentamicin cream  1 application Topical Daily  . hydrALAZINE  100 mg Oral TID  . minoxidil  2.5 mg Oral BID  . pantoprazole  40 mg Oral Daily  .  predniSONE  5 mg Oral Q breakfast  . sevelamer carbonate  1,600 mg Oral TID WC    have reviewed scheduled and prn medications.  Physical Exam: General: resting- polite- says he feels better Heart: RRR Lungs: mostly clear Abdomen: soft, non tender- fluid in Extremities: wrinkled extremities- appears as if have lost previous fluid Dialysis Access: PD cath as well as HD cath     11/30/2014,7:30 AM  LOS: 1 day

## 2014-11-30 NOTE — Progress Notes (Signed)
[place] TEAM 1 - Stepdown/ICU TEAM Progress Note  Frank [name] YNW:[id number] DOB: Nov 13, 1965 DOA: 11/29/2014 PCP: Angelica Chessman, MD  Admit HPI / Brief Narrative: 49 yo M Hx ESRD on peritoneal dialysis with when necessary hemodialysis, diabetes mellitus, anasarca, hypertension, chronic pain syndrome, and chronic noncompliance with medical regimen who was seen in the ED for complaints of chest pain on 5/7, 5/8, and then 5/9-10 and admitted for chest on 5/10.  HPI/Subjective: The pt currently denies chest pain, but is c/o back pain. He asks for more pain medication.  He denies sob, n/v, or abdom pain.    Assessment/Plan:  Accelerated hypertension -blood pressure much improved but not yet at goal - continue to titrate medical regimen - attempt to wean from nitro gtt over night   acute on chronic combined systolic and diastolic CHF / pericardial effusion -EF via TTE March 2016 30-35%  -secondary to noncompliance w/ medical regimen +/- dialysis  -repeat TTE pending  Pulmonary hypertension -See accelerated hypertension  Chest pain -troponin not significantly elevated -comparison of admission EKG to EKG from 11/17/2014 no significant change   Chronic anticoagulation - newly diagnosed extensive RUE DVT -hx is limited - reportedly had PE diagnoses in Oct 2015 (CTa chest March 2016 w/o PE) - clearly not compliant w/ warfarin as INR normal at time of admit - resume warfarin for now w/ IV heparin overlap   ESRD on PD Care per Nephrology   Noncompliance with medical treatment  Mild aneurysmal enlargement of the proximal descending thoracic aorta Noted via CTa March 2016 - needs annual CTa or MRA to follow  Hyperkalemia Corrected w/ PD  Hypocalcemia  Chronic anemia No evidence of acute blood loss - transfuse prn Hgb <7.0  Code Status: FULL Family Communication: no family present at time of exam Disposition Plan: SDU  Consultants: Nephrology  Procedures: 5/11 - RUE  venous duplex - Indeterminate age DVT of the right internal jugular vein. Mixed acute and chronic DVT of the right brachial vein. Subacute superficial thrombosis of the right basilic vein.  Culture NA  Antibiotics: NA  DVT prophylaxis: IV heparin   Objective: Blood pressure 154/94, pulse 86, temperature 97.4 F (36.3 C), temperature source Oral, resp. rate 12, height _0  (1.854 m), weight 87.3 kg (192 lb 7.4 oz), SpO2 97 %.  Intake/Output Summary (Last 24 hours) at 11/30/14 1534 Last data filed at 11/30/14 1330  Gross per 24 hour  Intake 1059.05 ml  Output   2709 ml  Net -1649.95 ml   Exam: General: No acute respiratory distress Lungs: Clear to auscultation bilaterally without wheezes or crackles Cardiovascular: Regular rate and rhythm without murmur gallop or rub  Abdomen: Nontender, nondistended, soft, bowel sounds positive, no rebound, no ascites, no appreciable mass - PD cath insertion site clean and dry  Extremities: No significant cyanosis, clubbing, or edema bilateral lower extremities  Data Reviewed: Basic Metabolic Panel:  Recent Labs Lab 11/26/14 2347 11/28/14 2122 11/29/14 1025 11/29/14 1430 11/30/14 0910  NA 136 138 134*  --  136  K 6.1* 6.0* 6.3*  --  4.5  CL 98* 100* 100*  --  98*  CO2 22 16* 18*  --  21*  GLUCOSE 124* 79 111*  --  162*  BUN 95* 106* 107*  --  90*  CREATININE 20.88* 23.04* 22.98*  --  20.27*  CALCIUM 7.4* 7.4* 7.2*  --  7.4*  MG  --   --   --  1.9  --  PHOS  --   --  8.0*  --  7.3*   Liver Function Tests:  Recent Labs Lab 11/26/14 2347 11/29/14 1025 11/30/14 0910  AST 62*  --   --   ALT 19  --   --   ALKPHOS 110  --   --   BILITOT 0.8  --   --   PROT 6.7  --   --   ALBUMIN 2.5* 2.2* 2.0*   CBC:  Recent Labs Lab 11/26/14 2347 11/28/14 2122 11/29/14 1025  WBC 5.7 5.7 5.2  NEUTROABS 4.3  --   --   HGB 8.0* 8.2* 7.4*  HCT 24.7* 24.8* 22.4*  MCV 84.9 82.7 83.9  PLT 199 220 205    Recent Results (from the past  240 hour(s))  MRSA PCR Screening     Status: None   Collection Time: 11/29/14  4:09 AM  Result Value Ref Range Status   MRSA by PCR NEGATIVE NEGATIVE Final    Comment:        The GeneXpert MRSA Assay (FDA approved for NASAL specimens only), is one component of a comprehensive MRSA colonization surveillance program. It is not intended to diagnose MRSA infection nor to guide or monitor treatment for MRSA infections.      Studies:  Recent x-ray studies have been reviewed in detail by the Attending Physician  Scheduled Meds:  Scheduled Meds: . sodium chloride   Intravenous Once  . atorvastatin  40 mg Oral q1800  . carvedilol  25 mg Oral BID WC  . cinacalcet  30 mg Oral Q breakfast  . darbepoetin (ARANESP) injection - NON-DIALYSIS  150 mcg Subcutaneous Q Wed-1800  . dialysis solution 2.5% low-MG/low-CA   Intraperitoneal Q24H  . dialysis solution 4.25% low-MG/low-CA   Intraperitoneal Q24H  . gentamicin cream  1 application Topical Daily  . hydrALAZINE  100 mg Oral TID  . minoxidil  2.5 mg Oral BID  . pantoprazole  40 mg Oral Daily  . predniSONE  5 mg Oral Q breakfast  . sevelamer carbonate  1,600 mg Oral TID WC    Time spent on care of this patient: 35 mins  Cherene Altes, MD [facility] For Consults/Admissions - Flow Manager - [phone] Office  [phone]  Contact MD directly via text page:      [email or web address]      password [facility]  11/30/2014, 3:34 PM   LOS: 1 day

## 2014-11-30 NOTE — Progress Notes (Signed)
ANTICOAGULATION CONSULT NOTE - Initial Consult  Pharmacy Consult for heparin, coumadin Indication: DVT  Allergies  Allergen Reactions  . Ferrlecit [Na Ferric Gluc Cplx In Sucrose] Shortness Of Breath, Swelling and Other (See Comments)    Swelling in throat  . Darvocet [Propoxyphene N-Acetaminophen] Hives    Patient Measurements: Height: _0  (185.4 cm) Weight: 192 lb 7.4 oz (87.3 kg) IBW/kg (Calculated) : 79.9 Heparin Dosing Weight: 80 kg   Vital Signs: Temp: 97.7 F (36.5 C) (05/11 1648) Temp Source: Oral (05/11 1648) BP: 135/73 mmHg (05/11 1648) Pulse Rate: 71 (05/11 1648)  Labs:  Recent Labs  11/28/14 2122 11/29/14 1025 11/29/14 1430 11/30/14 0910  HGB 8.2* 7.4*  --   --   HCT 24.8* 22.4*  --   --   PLT 220 205  --   --   LABPROT 15.1  --   --   --   INR 1.18  --   --   --   HEPARINUNFRC  --   --  1.80*  --   CREATININE 23.04* 22.98*  --  20.27*    Estimated Creatinine Clearance: 4.9 mL/min (by C-G formula based on Cr of 20.27).   Medical History: Past Medical History  Diagnosis Date  . Hypertension   . Depression   . Complication of anesthesia     itching, sore throat  . Diabetes mellitus without complication     No history per patient, but remains under history as A1c would not be accurate given on dialysis  . Shortness of breath   . Anxiety   . ESRD (end stage renal disease)     due to HTN per patient, followed at [facility], s/p failed kidney transplant - dialysis Tue, Th, Sat  . Renal insufficiency   . CHF (congestive heart failure)   . Anemia     Medications:  Prescriptions prior to admission  Medication Sig Dispense Refill Last Dose  . atorvastatin (LIPITOR) 40 MG tablet Take 1 tablet (40 mg total) by mouth daily at 6 PM. 30 tablet 1 11/28/2014  . carvedilol (COREG) 25 MG tablet Take 1 tablet (25 mg total) by mouth 2 (two) times daily with a meal.   11/28/2014 at 530 pm  . cinacalcet (SENSIPAR) 30 MG tablet Take 30 mg by mouth daily.   11/28/2014   . diclofenac sodium (VOLTAREN) 1 % GEL Apply 2 g topically 4 (four) times daily. 2 Tube 2 11/28/2014  . docusate sodium (COLACE) 100 MG capsule Take 1 capsule (100 mg total) by mouth 2 (two) times daily. 60 capsule 0 11/28/2014  . doxercalciferol (HECTOROL) 4 MCG/2ML injection Inject 1.25 mLs (2.5 mcg total) into the vein Every Tuesday,Thursday,and Saturday with dialysis. 2 mL  11/26/2014  . hydrALAZINE (APRESOLINE) 25 MG tablet Take 100 mg by mouth 3 (three) times daily.    11/28/2014 at Unknown time  . levalbuterol (XOPENEX HFA) 45 MCG/ACT inhaler Inhale 2 puffs into the lungs every 6 (six) hours as needed for wheezing or shortness of breath. 1 Inhaler 2 unknown  . lisinopril (PRINIVIL,ZESTRIL) 10 MG tablet Take 1 tablet (10 mg total) by mouth daily. 30 tablet 0 11/28/2014  . LORazepam (ATIVAN) 0.5 MG tablet Take 1 tablet (0.5 mg total) by mouth at bedtime as needed for anxiety. 15 tablet 0 11/28/2014  . methocarbamol (ROBAXIN) 500 MG tablet Take 1 tablet (500 mg total) by mouth every 8 (eight) hours as needed for muscle spasms. 30 tablet 0 11/28/2014  . minoxidil (LONITEN) 2.5 MG  tablet Take 1 tablet (2.5 mg total) by mouth 2 (two) times daily. 60 tablet 0 11/28/2014  . omeprazole (PRILOSEC) 20 MG capsule Take 20 mg by mouth daily.   11/28/2014 at Unknown time  . predniSONE (DELTASONE) 5 MG tablet Take 5 mg by mouth daily with breakfast.   11/28/2014  . sevelamer carbonate (RENVELA) 800 MG tablet Take 1,600 mg by mouth 3 (three) times daily with meals.   11/28/2014 at Unknown time  . warfarin (COUMADIN) 4 MG tablet Take 8 mg by mouth daily.   11/27/2014 at Unknown time  . oxyCODONE (ROXICODONE) 5 MG immediate release tablet Take 1 tablet (5 mg total) by mouth every 6 (six) hours as needed for severe pain. (Patient not taking: Reported on 11/28/2014) 30 tablet 0 Completed Course at Unknown time    Assessment: 49 yo man to start heparin and coumadin for RUE DVT.  He was likely not compliant with his coumadin regimen as  admit INR 1.08 Goal of Therapy:  INR 2-3 Heparin level 0.3-0.7 units/ml Monitor platelets by anticoagulation protocol: Yes   Plan:  Heparin bolus 4000 units and drip at 1200 units/hr Check heparin level 8 hours after start Coumadin 10 mg po today Daily HL, CBC and PT/INR Monitor for bleeding complications  Thanks for allowing pharmacy to be a part of this patient's care.  Excell Seltzer, PharmD Clinical Pharmacist, [phone] 11/30/2014,5:51 PM

## 2014-11-30 NOTE — Progress Notes (Signed)
  Echocardiogram 2D Echocardiogram has been performed.  Frank [name] 11/30/2014, 4:06 PM

## 2014-11-30 NOTE — Progress Notes (Signed)
*  PRELIMINARY RESULTS* Vascular Ultrasound Right upper extremity venous duplex has been completed.  Preliminary findings: Indeterminate age DVT of the right internal jugular vein. Mixed acute and chronic DVT of the right brachial vein. Subacute superficial thrombosis of the right basilic vein.   Landry Mellow, RDMS, RVT  11/30/2014, 12:32 PM

## 2014-11-30 NOTE — Procedures (Addendum)
Pt placed on BIPAP using auto mode with a max-20 and min-5.  Pt has M/L nasal mask and sterile water was added for humidification. Pt resting comfortably.  RT will monitor pt.

## 2014-12-01 DIAGNOSIS — I2782 Chronic pulmonary embolism: Secondary | ICD-10-CM | POA: Diagnosis present

## 2014-12-01 LAB — CBC
HCT: 23.6 % — ABNORMAL LOW (ref 39.0–52.0)
HCT: 23.9 % — ABNORMAL LOW (ref 39.0–52.0)
Hemoglobin: 7.8 g/dL — ABNORMAL LOW (ref 13.0–17.0)
Hemoglobin: 7.8 g/dL — ABNORMAL LOW (ref 13.0–17.0)
MCH: 26.8 pg (ref 26.0–34.0)
MCH: 27.1 pg (ref 26.0–34.0)
MCHC: 33.1 g/dL (ref 30.0–36.0)
MCHC: 32.6 g/dL (ref 30.0–36.0)
MCV: 81.9 fL (ref 78.0–100.0)
MCV: 82.1 fL (ref 78.0–100.0)
PLATELETS: 220 10*3/uL (ref 150–400)
Platelets: 223 10*3/uL (ref 150–400)
RBC: 2.91 MIL/uL — ABNORMAL LOW (ref 4.22–5.81)
RBC: 2.88 MIL/uL — AB (ref 4.22–5.81)
RDW: 14.8 % (ref 11.5–15.5)
RDW: 15.1 % (ref 11.5–15.5)
WBC: 6.2 10*3/uL (ref 4.0–10.5)
WBC: 6.2 10*3/uL (ref 4.0–10.5)

## 2014-12-01 LAB — RENAL FUNCTION PANEL
ALBUMIN: 1.9 g/dL — AB (ref 3.5–5.0)
Anion gap: 15 (ref 5–15)
BUN: 86 mg/dL — ABNORMAL HIGH (ref 6–20)
CALCIUM: 7 mg/dL — AB (ref 8.9–10.3)
CO2: 23 mmol/L (ref 22–32)
Chloride: 97 mmol/L — ABNORMAL LOW (ref 101–111)
Creatinine, Ser: 20.46 mg/dL — ABNORMAL HIGH (ref 0.61–1.24)
GFR calc non Af Amer: 2 mL/min — ABNORMAL LOW (ref >60)
GFR, EST AFRICAN AMERICAN: 3 mL/min — AB (ref >60)
GLUCOSE: 127 mg/dL — AB (ref 65–99)
POTASSIUM: 4.8 mmol/L (ref 3.5–5.1)
Phosphorus: 7 mg/dL — ABNORMAL HIGH (ref 2.5–4.6)
Sodium: 135 mmol/L (ref 135–145)

## 2014-12-01 LAB — PROTIME-INR
INR: 1.29 (ref 0.00–1.49)
Prothrombin Time: 16.2 seconds — ABNORMAL HIGH (ref 11.6–15.2)

## 2014-12-01 LAB — HEPARIN LEVEL (UNFRACTIONATED)
HEPARIN UNFRACTIONATED: 0.18 [IU]/mL — AB (ref 0.30–0.70)
Heparin Unfractionated: 0.15 IU/mL — ABNORMAL LOW (ref 0.30–0.70)

## 2014-12-01 LAB — PARATHYROID HORMONE, INTACT (NO CA): PTH: 544 pg/mL — AB (ref 15–65)

## 2014-12-01 MED ORDER — HEPARIN BOLUS VIA INFUSION
2000.0000 [IU] | Freq: Once | INTRAVENOUS | Status: AC
  Administered 2014-12-01: 2000 [IU] via INTRAVENOUS
  Filled 2014-12-01: qty 2000

## 2014-12-01 MED ORDER — MAGNESIUM SULFATE IN D5W 10-5 MG/ML-% IV SOLN
1.0000 g | Freq: Once | INTRAVENOUS | Status: AC
  Administered 2014-12-01: 1 g via INTRAVENOUS
  Filled 2014-12-01: qty 100

## 2014-12-01 MED ORDER — WARFARIN SODIUM 10 MG PO TABS
10.0000 mg | ORAL_TABLET | Freq: Once | ORAL | Status: AC
  Administered 2014-12-01: 10 mg via ORAL
  Filled 2014-12-01 (×3): qty 1

## 2014-12-01 MED ORDER — HEPARIN (PORCINE) IN NACL 100-0.45 UNIT/ML-% IJ SOLN
1900.0000 [IU]/h | INTRAMUSCULAR | Status: DC
  Administered 2014-12-01: 1650 [IU]/h via INTRAVENOUS
  Administered 2014-12-02 – 2014-12-03 (×2): 1900 [IU]/h via INTRAVENOUS
  Filled 2014-12-01 (×5): qty 250

## 2014-12-01 NOTE — Progress Notes (Signed)
[place] TEAM 1 - Stepdown/ICU TEAM Progress Note  Frank [name] SPQ:[id number] DOB: [birth year] DOA: 11/29/2014 PCP: Angelica Chessman, MD  Admit HPI / Brief Narrative: Frank [name] is a 49 y.o. BM PMHx ESRD on peritoneal dialysis with when necessary hemodialysis, diabetes mellitus, anasarca, hypertension, chronic pain syndrome, chronic noncompliance with medical regimen.  Appears the patient was seen in the ED for complaints of chest pain on 5/7, 5/8, and then 5/9-10 and admitted for chest on 5/10. The patient is presenting with complaints of chest pain. This started this afternoon. Pain is located in the epigastric region and progressively radiating to his left arm. He also has pain in his right arm and swelling of the right arm as well as right side of the face. He denies any trauma or injury. He mentions he is compliant with all his medications and take them regularly. He mentions he has taken his Saturday and Sunday dose of Coumadin. He mentions he does peritoneal dialysis regularly and he has done peritoneal dialysis and 11/28/2014. He mentions he was last hemodialyzed 2 weeks ago. He mentions he is in the process of switching to [facility] for continuation of his hemodialysis from [facility].   HPI/Subjective: 5/12 A/O 4, resting comfortably NAD, CP/SOB resolved  Assessment/Plan: Accelerated hypertension -DC nitro drip -Hydralazine 100 mg TID -Labetalol IV 20 mg PRN -Coreg 25 mg BID -Lisinopril 10 mg daily, will most likely have to increase  acute on chronic combined CHF/dilated cardiomyopathy/pericardial effusion. -Most likely secondary to noncompliance to medical regimen secondary to accelerated hypertension. -Echocardiogram; essentially unchanged from previous echocardiogram see results below   Pulmonary hypertension -See accelerated hypertension  Chest pain -Troponins negative -Comparison of admission EKG to EKG from 11/17/2014 no significant change   Chronic  anticoagulation for PE/acute/subacute DVT -Will DC anticoagulation as patient only had one incident of hypercoagulability ~approximately 7 months ago (05/08/2014) acute PE -5/12 restart heparin drip for acute/subacute DVT upper extremity see venous Doppler below  Noncompliance with medical treatment -Patient subtherapeutic INR for patient's poor compliance  Hyperkalemia. -Resolved, monitor closely -Potassium goal>4  Hypocalcemia -Chronic  Hypomagnesemia -Magnesium goal> 2 -1 gm magnesium  Chronic anemia. -Continue close monitoring after starting him on heparin.   Code Status: FULL Family Communication: no family present at time of exam Disposition Plan: Nephrology    Consultants: Dr. Roney Jaffe (nephrology)   Procedure/Significant Events: 3/13 echocardiogram;- Left ventricle: moderate LVH. LVEF= 30% to 35%. Diffuse hypokinesis.  - Left atrium: severely dilated.- Right ventricle:  moderately dilated. - Right atrium: mildly dilated. CVP=15 mm Hg. - Pulmonary arteries: PA peak pressure: 40 mm Hg (S). - Pericardium, extracardiac: A small pericardial effusion with moderate collection at anterior base around right atrium. 5/11 echocardiogram;Left ventricle: mildconcentric hypertrophy. -LVEF 35% to 40%.Diffuse hypokinesis.  -(grade 2 diastolicdysfunction). - Left atrium: severely dilated.- Right ventricle: severely dilated. - Right atrium: severely dilated. - Pulmonary arteries: PApeak pressure: 36 mm Hg (S). -Pericardium, extracardiac: A small to moderate, free-flowing pericardial effusion no evidence of hemodynamic compromise.  5/11 right upper extremity venous duplex; Indeterminate age DVT of the right internal jugular vein. - Mixed acute and chronic DVT of the right brachial vein.  -Subacute superficial thrombosis of the right basilic vein.   Culture NA  Antibiotics: NA  DVT prophylaxis: Heparin/SCD   Devices   LINES / TUBES:  PD  cath    Continuous Infusions: . heparin 1,450 Units/hr (12/01/14 0800)  . heparin 1,650 Units/hr (12/01/14 2100)    Objective: VITAL SIGNS: Temp:  97.9 F (36.6 C) (05/12 1913) Temp Source: Oral (05/12 1913) BP: 129/65 mmHg (05/12 2000) Pulse Rate: 82 (05/12 2000) SPO2; FIO2:   Intake/Output Summary (Last 24 hours) at 12/01/14 2201 Last data filed at 12/01/14 2100  Gross per 24 hour  Intake   3156 ml  Output   1790 ml  Net   1366 ml     Exam: General: A/O 4, NAD, No acute respiratory distress Lungs: Clear to auscultation bilaterally without wheezes or crackles Cardiovascular: Regular rate and rhythm without murmur gallop or rub normal S1 and S2 Abdomen: Nontender, distended, soft, bowel sounds positive, no rebound, no ascites, no appreciable mass Extremities: No significant cyanosis, clubbing, or edema bilateral lower extremities    Data Reviewed: Basic Metabolic Panel:  Recent Labs Lab 11/26/14 2347 11/28/14 2122 11/29/14 1025 11/29/14 1430 11/30/14 0910 12/01/14 0351  NA 136 138 134*  --  136 135  K 6.1* 6.0* 6.3*  --  4.5 4.8  CL 98* 100* 100*  --  98* 97*  CO2 22 16* 18*  --  21* 23  GLUCOSE 124* 79 111*  --  162* 127*  BUN 95* 106* 107*  --  90* 86*  CREATININE 20.88* 23.04* 22.98*  --  20.27* 20.46*  CALCIUM 7.4* 7.4* 7.2*  --  7.4* 7.0*  MG  --   --   --  1.9  --   --   PHOS  --   --  8.0*  --  7.3* 7.0*   Liver Function Tests:  Recent Labs Lab 11/26/14 2347 11/29/14 1025 11/30/14 0910 12/01/14 0351  AST 62*  --   --   --   ALT 19  --   --   --   ALKPHOS 110  --   --   --   BILITOT 0.8  --   --   --   PROT 6.7  --   --   --   ALBUMIN 2.5* 2.2* 2.0* 1.9*   No results for input(s): LIPASE, AMYLASE in the last 168 hours. No results for input(s): AMMONIA in the last 168 hours. CBC:  Recent Labs Lab 11/26/14 2347 11/28/14 2122 11/29/14 1025 12/01/14 0350 12/01/14 0351  WBC 5.7 5.7 5.2 6.2 6.2  NEUTROABS 4.3  --   --   --   --    HGB 8.0* 8.2* 7.4* 7.8* 7.8*  HCT 24.7* 24.8* 22.4* 23.9* 23.6*  MCV 84.9 82.7 83.9 82.1 81.9  PLT 199 220 205 223 220   Cardiac Enzymes: No results for input(s): CKTOTAL, CKMB, CKMBINDEX, TROPONINI in the last 168 hours. BNP (last 3 results)  Recent Labs  09/30/14 1556 11/17/14 0543 11/26/14 2352  BNP >4500.0* 2888.3* >4500.0*    ProBNP (last 3 results)  Recent Labs  01/02/14 0317 01/18/14 0048 04/15/14 0809  PROBNP >70000.0* >70000.0* >70000.0*    CBG: No results for input(s): GLUCAP in the last 168 hours.  Recent Results (from the past 240 hour(s))  MRSA PCR Screening     Status: None   Collection Time: 11/29/14  4:09 AM  Result Value Ref Range Status   MRSA by PCR NEGATIVE NEGATIVE Final    Comment:        The GeneXpert MRSA Assay (FDA approved for NASAL specimens only), is one component of a comprehensive MRSA colonization surveillance program. It is not intended to diagnose MRSA infection nor to guide or monitor treatment for MRSA infections.      Studies:  Recent x-ray studies  have been reviewed in detail by the Attending Physician  Scheduled Meds:  Scheduled Meds: . atorvastatin  40 mg Oral q1800  . carvedilol  25 mg Oral BID WC  . cinacalcet  30 mg Oral Q breakfast  . darbepoetin (ARANESP) injection - NON-DIALYSIS  150 mcg Subcutaneous Q Wed-1800  . dialysis solution 2.5% low-MG/low-CA   Intraperitoneal Q24H  . docusate sodium  100 mg Oral BID  . gentamicin cream  1 application Topical Daily  . hydrALAZINE  100 mg Oral TID  . lisinopril  10 mg Oral Daily  . magnesium sulfate 1 - 4 g bolus IVPB  1 g Intravenous Once  . minoxidil  2.5 mg Oral BID  . pantoprazole  40 mg Oral Daily  . predniSONE  5 mg Oral Q breakfast  . sevelamer carbonate  1,600 mg Oral TID WC  . Warfarin - Pharmacist Dosing Inpatient   Does not apply q1800    Time spent on care of this patient: 40 mins   WOODS, Geraldo Docker , MD  [facility] Office   [phone] Pager - [phone]  On-Call/Text Page:      [email or web address]      password [email or web address]  If 7PM-7AM, please contact night-coverage [email or web address] Password [email or web address] 12/01/2014, 10:01 PM   LOS: 2 days   Care during the described time interval was provided by me .  I have reviewed this patient's available data, including medical history, events of note, physical examination, radiology studies and test results as part of my evaluation  Dia Crawford, MD [phone] Pager

## 2014-12-01 NOTE — Progress Notes (Signed)
Dr. Sherral Hammers notified that pt will not allow phlebotomy to draw heparin level at this time. Explained to pt that labs could not be drawn from either arm, and that only other place was feet.  Dr. Sherral Hammers discussed situation with patient over the phone.  Will continue to monitor pt closely.

## 2014-12-01 NOTE — Progress Notes (Signed)
Patient refused CPAP. Patient stated he would notify nursing staff when he was ready to be placed on CPAP

## 2014-12-01 NOTE — Progress Notes (Signed)
[facility] for heparin Indication: DVT  Allergies  Allergen Reactions  . Ferrlecit [Na Ferric Gluc Cplx In Sucrose] Shortness Of Breath, Swelling and Other (See Comments)    Swelling in throat  . Darvocet [Propoxyphene N-Acetaminophen] Hives    Patient Measurements: Height: 6' 1" (185.4 cm) Weight: 187 lb 8 oz (85.049 kg) IBW/kg (Calculated) : 79.9  Vital Signs: Temp: 97.9 F (36.6 C) (05/12 1913) Temp Source: Oral (05/12 1913) BP: 138/77 mmHg (05/12 1913) Pulse Rate: 77 (05/12 1913)  Labs:  Recent Labs  11/28/14 2122 11/29/14 1025  11/30/14 0910 12/01/14 0350 12/01/14 0351 12/01/14 1245 12/01/14 1830  HGB 8.2* 7.4*  --   --  7.8* 7.8*  --   --   HCT 24.8* 22.4*  --   --  23.9* 23.6*  --   --   PLT 220 205  --   --  223 220  --   --   LABPROT 15.1  --   --   --  16.2*  --   --   --   INR 1.18  --   --   --  1.29  --   --   --   HEPARINUNFRC  --   --   < >  --   --  0.18* >2.20* 0.15*  CREATININE 23.04* 22.98*  --  20.27*  --  20.46*  --   --   < > = values in this interval not displayed.  Estimated Creatinine Clearance: 4.9 mL/min (by C-G formula based on Cr of 20.46).  Assessment: 49 yo man with new DVT, h/o PE and INR subtherapeutic, for anticoagulation.  Heparin level earlier today was >2.2 but felt was lab draw error. Heparin level now remains low  Patient has been refusing lab draws.  Goal of Therapy:  Heparin level 0.3-0.7 units/ml Monitor platelets by anticoagulation protocol: Yes   Plan:  Heparin 2000 units IV bolus, then increase heparin 1650 units/hr  Check heparin level in 8 hours.   Thanks for allowing pharmacy to be a part of this patient's care.  Excell Seltzer, PharmD Clinical Pharmacist, [phone] 12/01/2014,7:29 PM

## 2014-12-01 NOTE — Progress Notes (Signed)
Patient requested that IV therapy nurse drawn stat heparin level, and may draw from feet.   IV team consult ordered.

## 2014-12-01 NOTE — Progress Notes (Signed)
Lab (phlebotomy) notified that they would need to draw heparin level on patient.

## 2014-12-01 NOTE — Progress Notes (Signed)
12/01/2014 2:45 PM  Pt at this time will only allow me to instill 2000cc of standard 2.5% dialysate into his abdomen during this exchange.  He states that he feels like 2200cc is too much at one time and he is unable to eat with that much fluid on his belly.  Otherwise, pt tolerated procedure well.  Princella Pellegrini

## 2014-12-01 NOTE — Progress Notes (Signed)
Pharmacist notified of heparin level drawn from dialysis catheter with blood discarded and line flushed.  Heparin level to be redrawn per pharmacist.  Will continue to monitor pt closely.

## 2014-12-01 NOTE — Progress Notes (Signed)
[place] for heparin, Coumadin Indication: DVT  Allergies  Allergen Reactions  . Ferrlecit [Na Ferric Gluc Cplx In Sucrose] Shortness Of Breath, Swelling and Other (See Comments)    Swelling in throat  . Darvocet [Propoxyphene N-Acetaminophen] Hives    Patient Measurements: Height: _0  (185.4 cm) Weight: 187 lb 8 oz (85.049 kg) IBW/kg (Calculated) : 79.9  Vital Signs: Temp: 98.2 F (36.8 C) (05/12 0300) Temp Source: Oral (05/12 0300) BP: 115/61 mmHg (05/12 0400) Pulse Rate: 91 (05/12 0400)  Labs:  Recent Labs  11/28/14 2122 11/29/14 1025 11/29/14 1430 11/30/14 0910 12/01/14 0350 12/01/14 0351  HGB 8.2* 7.4*  --   --  7.8* 7.8*  HCT 24.8* 22.4*  --   --  23.9* 23.6*  PLT 220 205  --   --  223 220  LABPROT 15.1  --   --   --  16.2*  --   INR 1.18  --   --   --  1.29  --   HEPARINUNFRC  --   --  1.80*  --   --  0.18*  CREATININE 23.04* 22.98*  --  20.27*  --  20.46*    Estimated Creatinine Clearance: 4.9 mL/min (by C-G formula based on Cr of 20.46).  Assessment: 49 yo man with new DVT, h/o PE and INR subtherapeutic, for anticoagulation  Goal of Therapy:  INR 2-3 Heparin level 0.3-0.7 units/ml Monitor platelets by anticoagulation protocol: Yes   Plan:  Heparin 2000 units IV bolus, then increase heparin 1450 units/hr  Check heparin level in 8 hours.  Coumadin 10 mg today  Phillis Knack, PharmD, BCPS  12/01/2014,4:36 AM

## 2014-12-01 NOTE — Progress Notes (Signed)
Subjective:  No complaints this AM- says he feels better- BP much better- off ntg- removed nearly 3 liters with PD overnight Objective Vital signs in last 24 hours: Filed Vitals:   12/01/14 0200 12/01/14 0300 12/01/14 0400 12/01/14 0600  BP: 144/80  115/61 136/75  Pulse: 89 86 91 90  Temp:  98.2 F (36.8 C)    TempSrc:  Oral    Resp: 10 0 14 10  Height:      Weight:  85.049 kg (187 lb 8 oz)    SpO2: 97% 96% 96% 96%   Weight change: -2.251 kg (-4 lb 15.4 oz)  Intake/Output Summary (Last 24 hours) at 12/01/14 0719 Last data filed at 12/01/14 0600  Gross per 24 hour  Intake 863.95 ml  Output   2709 ml  Net -1845.05 ml    Assessment/ Plan: Pt is a 49 y.o. yo male who was admitted on 11/29/2014 with ESRD noncompliance with PD/malignant HTN/volume overload and uremia  Assessment/Plan: 1. ESRD- plan is for regular PD in controlled environment for 48-72 hours to see if uremia and volume are controllable on this regimen.  Patient is anxious to do PD as an OP.  48 hours in, so far is working- I told him he needs to work on getting all of the appropriate supplies at home so can continue because we know that this regimen will work for him.  I think could move to 6700 today, continue treatments and if no events could be discharged tomorrow.   It is noted that patient wishes to change to [place].  I really do not see that happening as an aside unless he were to change into a completely different person (weekly ER visits, mult issues with noncompliance).   2. HTN/volume- is overloaded but PD has helped with that - on coreg 25 BID, minoxidil 2.5 BID, hydralazine 100 TID- and now lisinopril 10-  PRN IV labetalol and ntg drip  - blood pressure much better  With forced compliance of all- probably does not need as much blood pressure meds that he is on but would need continued compliance to know.  3. Anemia- severe-  iron stores OK-  and giving aranesp 4. Secondary hyperparathyroidism- on  sensipar-on sevelamer as well 5. Hyperkalemia- resolved 6. Dispo- could probably be discharged tomorrow - I put in on the patient to get the appropriate supplies which he should be able to do and should be enough motivation if he really wants to continue PD- if continues to be a problem then I agree with his primary nephrologist needs to go back to HD   [facility]: Basic Metabolic Panel:  Recent Labs Lab 11/29/14 1025 11/30/14 0910 12/01/14 0351  NA 134* 136 135  K 6.3* 4.5 4.8  CL 100* 98* 97*  CO2 18* 21* 23  GLUCOSE 111* 162* 127*  BUN 107* 90* 86*  CREATININE 22.98* 20.27* 20.46*  CALCIUM 7.2* 7.4* 7.0*  PHOS 8.0* 7.3* 7.0*   Liver Function Tests:  Recent Labs Lab 11/26/14 2347 11/29/14 1025 11/30/14 0910 12/01/14 0351  AST 62*  --   --   --   ALT 19  --   --   --   ALKPHOS 110  --   --   --   BILITOT 0.8  --   --   --   PROT 6.7  --   --   --   ALBUMIN 2.5* 2.2* 2.0* 1.9*   No results for  input(s): LIPASE, AMYLASE in the last 168 hours. No results for input(s): AMMONIA in the last 168 hours. CBC:  Recent Labs Lab 11/26/14 2347 11/28/14 2122 11/29/14 1025 12/01/14 0350 12/01/14 0351  WBC 5.7 5.7 5.2 6.2 6.2  NEUTROABS 4.3  --   --   --   --   HGB 8.0* 8.2* 7.4* 7.8* 7.8*  HCT 24.7* 24.8* 22.4* 23.9* 23.6*  MCV 84.9 82.7 83.9 82.1 81.9  PLT 199 220 205 223 220   Cardiac Enzymes: No results for input(s): CKTOTAL, CKMB, CKMBINDEX, TROPONINI in the last 168 hours. CBG: No results for input(s): GLUCAP in the last 168 hours.  Iron Studies:   Recent Labs  11/30/14 0910  IRON 85  TIBC 203*  FERRITIN 82   Studies/Results: No results found. Medications: Infusions: . heparin 1,450 Units/hr (12/01/14 0501)  . nitroGLYCERIN Stopped (11/30/14 1918)    Scheduled Medications: . atorvastatin  40 mg Oral q1800  . carvedilol  25 mg Oral BID WC  . cinacalcet  30 mg Oral Q breakfast  . darbepoetin (ARANESP) injection -  NON-DIALYSIS  150 mcg Subcutaneous Q Wed-1800  . dialysis solution 2.5% low-MG/low-CA   Intraperitoneal Q24H  . dialysis solution 4.25% low-MG/low-CA   Intraperitoneal Q24H  . docusate sodium  100 mg Oral BID  . gentamicin cream  1 application Topical Daily  . hydrALAZINE  100 mg Oral TID  . lisinopril  10 mg Oral Daily  . minoxidil  2.5 mg Oral BID  . pantoprazole  40 mg Oral Daily  . predniSONE  5 mg Oral Q breakfast  . sevelamer carbonate  1,600 mg Oral TID WC  . warfarin  10 mg Oral ONCE-1800  . warfarin  10 mg Oral ONCE-1800  . Warfarin - Pharmacist Dosing Inpatient   Does not apply q1800    have reviewed scheduled and prn medications.  Physical Exam: General: resting- polite- says he feels better Heart: RRR Lungs: mostly clear Abdomen: soft, non tender- fluid in Extremities: wrinkled extremities- appears as if have lost previous fluid Dialysis Access: PD cath as well as HD cath     12/01/2014,7:19 AM  LOS: 2 days

## 2014-12-02 ENCOUNTER — Inpatient Hospital Stay (HOSPITAL_COMMUNITY): Payer: Medicare Other

## 2014-12-02 DIAGNOSIS — D631 Anemia in chronic kidney disease: Secondary | ICD-10-CM

## 2014-12-02 DIAGNOSIS — Z7901 Long term (current) use of anticoagulants: Secondary | ICD-10-CM

## 2014-12-02 DIAGNOSIS — I5043 Acute on chronic combined systolic (congestive) and diastolic (congestive) heart failure: Secondary | ICD-10-CM

## 2014-12-02 DIAGNOSIS — I10 Essential (primary) hypertension: Secondary | ICD-10-CM

## 2014-12-02 DIAGNOSIS — I2782 Chronic pulmonary embolism: Secondary | ICD-10-CM

## 2014-12-02 DIAGNOSIS — E1165 Type 2 diabetes mellitus with hyperglycemia: Secondary | ICD-10-CM

## 2014-12-02 DIAGNOSIS — N185 Chronic kidney disease, stage 5: Secondary | ICD-10-CM

## 2014-12-02 DIAGNOSIS — R601 Generalized edema: Secondary | ICD-10-CM

## 2014-12-02 LAB — RENAL FUNCTION PANEL
Albumin: 1.8 g/dL — ABNORMAL LOW (ref 3.5–5.0)
Anion gap: 14 (ref 5–15)
BUN: 74 mg/dL — ABNORMAL HIGH (ref 6–20)
CO2: 23 mmol/L (ref 22–32)
Calcium: 6.8 mg/dL — ABNORMAL LOW (ref 8.9–10.3)
Chloride: 95 mmol/L — ABNORMAL LOW (ref 101–111)
Creatinine, Ser: 19.06 mg/dL — ABNORMAL HIGH (ref 0.61–1.24)
GFR calc Af Amer: 3 mL/min — ABNORMAL LOW (ref >60)
GFR calc non Af Amer: 2 mL/min — ABNORMAL LOW (ref >60)
GLUCOSE: 139 mg/dL — AB (ref 65–99)
PHOSPHORUS: 5.6 mg/dL — AB (ref 2.5–4.6)
Potassium: 4 mmol/L (ref 3.5–5.1)
SODIUM: 132 mmol/L — AB (ref 135–145)

## 2014-12-02 LAB — CBC
HCT: 23.5 % — ABNORMAL LOW (ref 39.0–52.0)
HEMOGLOBIN: 7.8 g/dL — AB (ref 13.0–17.0)
MCH: 27.3 pg (ref 26.0–34.0)
MCHC: 33.2 g/dL (ref 30.0–36.0)
MCV: 82.2 fL (ref 78.0–100.0)
PLATELETS: 223 10*3/uL (ref 150–400)
RBC: 2.86 MIL/uL — ABNORMAL LOW (ref 4.22–5.81)
RDW: 14.9 % (ref 11.5–15.5)
WBC: 5.2 10*3/uL (ref 4.0–10.5)

## 2014-12-02 LAB — PROTIME-INR
INR: 1.23 (ref 0.00–1.49)
Prothrombin Time: 15.6 seconds — ABNORMAL HIGH (ref 11.6–15.2)

## 2014-12-02 LAB — HEPARIN LEVEL (UNFRACTIONATED)
HEPARIN UNFRACTIONATED: 0.17 [IU]/mL — AB (ref 0.30–0.70)
HEPARIN UNFRACTIONATED: 0.5 [IU]/mL (ref 0.30–0.70)

## 2014-12-02 MED ORDER — HEPARIN BOLUS VIA INFUSION
2000.0000 [IU] | Freq: Once | INTRAVENOUS | Status: AC
  Administered 2014-12-02: 2000 [IU] via INTRAVENOUS
  Filled 2014-12-02: qty 2000

## 2014-12-02 MED ORDER — DELFLEX-LC/1.5% DEXTROSE 346 MOSM/L IP SOLN
INTRAPERITONEAL | Status: DC
  Administered 2014-12-02 – 2014-12-03 (×4): 5000 mL via INTRAPERITONEAL
  Administered 2014-12-04: 3000 mL via INTRAPERITONEAL

## 2014-12-02 MED ORDER — HYDRALAZINE HCL 25 MG PO TABS
25.0000 mg | ORAL_TABLET | Freq: Three times a day (TID) | ORAL | Status: DC
  Filled 2014-12-02 (×3): qty 1

## 2014-12-02 MED ORDER — WARFARIN SODIUM 10 MG PO TABS
10.0000 mg | ORAL_TABLET | Freq: Once | ORAL | Status: AC
  Administered 2014-12-02: 10 mg via ORAL
  Filled 2014-12-02: qty 1

## 2014-12-02 NOTE — Progress Notes (Signed)
Patient told me earlier in shift to come back around 11 to place him on CPAP.  He stated that if he was asleep do not wake him up.  Went to patient's room to put him on CPAP and he was sleeping so I did not wake him up.  RN is aware.

## 2014-12-02 NOTE — Progress Notes (Signed)
[facility] for heparin Indication: DVT  Allergies  Allergen Reactions  . Ferrlecit [Na Ferric Gluc Cplx In Sucrose] Shortness Of Breath, Swelling and Other (See Comments)    Swelling in throat  . Darvocet [Propoxyphene N-Acetaminophen] Hives    Patient Measurements: Height: _0  (185.4 cm) Weight: 181 lb 7 oz (82.3 kg) IBW/kg (Calculated) : 79.9  Vital Signs: Temp: 98.3 F (36.8 C) (05/13 0446) Temp Source: Oral (05/13 0446) BP: 105/47 mmHg (05/13 0446) Pulse Rate: 82 (05/13 0446)  Labs:  Recent Labs  11/29/14 1025  11/30/14 0910 12/01/14 0350 12/01/14 0351 12/01/14 1245 12/01/14 1830 12/02/14 0458  HGB 7.4*  --   --  7.8* 7.8*  --   --  7.8*  HCT 22.4*  --   --  23.9* 23.6*  --   --  23.5*  PLT 205  --   --  223 220  --   --  223  LABPROT  --   --   --  16.2*  --   --   --  15.6*  INR  --   --   --  1.29  --   --   --  1.23  HEPARINUNFRC  --   < >  --   --  0.18* >2.20* 0.15* 0.17*  CREATININE 22.98*  --  20.27*  --  20.46*  --   --   --   < > = values in this interval not displayed.  Estimated Creatinine Clearance: 4.9 mL/min (by C-G formula based on Cr of 20.46).  Assessment: 49 yo man with new DVT, h/o PE and INR subtherapeutic, for anticoagulation.    Goal of Therapy:  INR 2-3 Heparin level 0.3-0.7 units/ml Monitor platelets by anticoagulation protocol: Yes   Plan:  Heparin 2000 units IV bolus, then increase heparin 1900 units/hr  Check heparin level in 8 hours.  Coumadin 10 mg today  Phillis Knack, PharmD, BCPS

## 2014-12-02 NOTE — Care Management Note (Signed)
Case Management Note  Patient Details  Name: Frank [name] MRN: [id number] Date of Birth: 1965-10-17  Subjective/Objective:                 CM following for progression and d/c planning.   Action/Plan: 12/02/2014 Met with pt and IM given and explained.   Expected Discharge Date:        12/05/2014          Expected Discharge Plan:  Home/Self Care  In-House Referral:     Discharge planning Services  CM Consult  Post Acute Care Choice:    Choice offered to:     DME Arranged:    DME Agency:     HH Arranged:    HH Agency:     Status of Service:  In process, will continue to follow  Medicare Important Message Given:  Yes Date Medicare IM Given:  12/02/14 Medicare IM give by:  Jasmine Pang RN MPH [phone] Date Additional Medicare IM Given:    Additional Medicare Important Message give by:     If discussed at [place] of Stay Meetings, dates discussed:    Additional Comments:  Adron Bene, RN 12/02/2014, 3:22 PM

## 2014-12-02 NOTE — Progress Notes (Signed)
This Probation officer tried to convince him second time for his heparin level but patient said he is not feeling good right now and wanted the lab to come after an hour.  Reinforced the importance of heparin level as he is on heparin drip but in vain.

## 2014-12-02 NOTE — Progress Notes (Signed)
New Admission Note:   Arrival Method:  Via bed Mental Orientation: Alert and Oriented x4 Telemetry: n/a Assessment: Completed Skin: no break down noted IV: NSL Pain: DENIES Tubes: PD Catheter in LLQ Safety Measures: Safety Fall Prevention Plan has been given, discussed and signed Admission: Completed [address] Orientation: Patient has been orientated to the room, unit and staff.  Family: NONE PRESENT  Orders have been reviewed and implemented. Will continue to monitor the patient. Call light has been placed within reach and bed alarm has been activated.   Dudley Major BSN, Consulting civil engineer number: [phone]

## 2014-12-02 NOTE — Progress Notes (Signed)
[facility] for heparin Indication: DVT  Allergies  Allergen Reactions  . Ferrlecit [Na Ferric Gluc Cplx In Sucrose] Shortness Of Breath, Swelling and Other (See Comments)    Swelling in throat  . Darvocet [Propoxyphene N-Acetaminophen] Hives    Patient Measurements: Height: _0  (185.4 cm) Weight: 181 lb 7 oz (82.3 kg) IBW/kg (Calculated) : 79.9  Vital Signs: Temp: 98.4 F (36.9 C) (05/13 1718) Temp Source: Oral (05/13 1718) BP: 128/66 mmHg (05/13 1718) Pulse Rate: 78 (05/13 1718)  Labs:  Recent Labs  11/30/14 0910  12/01/14 0350 12/01/14 0351  12/01/14 1830 12/02/14 0438 12/02/14 0458 12/02/14 1858  HGB  --   < > 7.8* 7.8*  --   --   --  7.8*  --   HCT  --   --  23.9* 23.6*  --   --   --  23.5*  --   PLT  --   --  223 220  --   --   --  223  --   LABPROT  --   --  16.2*  --   --   --   --  15.6*  --   INR  --   --  1.29  --   --   --   --  1.23  --   HEPARINUNFRC  --   --   --  0.18*  < > 0.15*  --  0.17* 0.50  CREATININE 20.27*  --   --  20.46*  --   --  19.06*  --   --   < > = values in this interval not displayed.  Estimated Creatinine Clearance: 5.2 mL/min (by C-G formula based on Cr of 19.06).  Assessment: 49 yo man with new DVT, h/o PE and INR subtherapeutic, for anticoagulation.    Repeat heparin level is therapeutic - >8 hr level due to patient refusing prior labs.  No bleeding noted, no issues.   Goal of Therapy:  INR 2-3 Heparin level 0.3-0.7 units/ml Monitor platelets by anticoagulation protocol: Yes   Plan:  Continue heparin at 1900 units/hr.  Follow-up AM level.   Sloan Leiter, PharmD, BCPS Clinical Pharmacist [phone] 12/02/2014, 7:43 PM

## 2014-12-02 NOTE — Progress Notes (Signed)
Subjective:  Dizzy because blood pressure is better than it ever has been.  I told him that if he had been compliant with his dialysis the he likely would not need as much BP medicine- also weak- hasnt been out of bed in 3-4 days according to him.  He is still working on supplies for PD at home  Objective Vital signs in last 24 hours: Filed Vitals:   12/01/14 2000 12/01/14 2204 12/02/14 0230 12/02/14 0446  BP: 129/65 120/64  105/47  Pulse: 82 78  82  Temp:  98.8 F (37.1 C)  98.3 F (36.8 C)  TempSrc:  Oral  Oral  Resp: _0 Height:  _1  (1.854 m)    Weight:  82.3 kg (181 lb 7 oz) 82.3 kg (181 lb 7 oz)   SpO2: 96% 100%  99%   Weight change: -2.749 kg (-6 lb 1 oz)  Intake/Output Summary (Last 24 hours) at 12/02/14 6659 Last data filed at 12/02/14 0500  Gross per 24 hour  Intake   4474 ml  Output      0 ml  Net   4474 ml    Assessment/ Plan: Pt is a 49 y.o. yo male who was admitted on 11/29/2014 with ESRD noncompliance with PD/malignant HTN/volume overload and uremia  Assessment/Plan: 1. ESRD- plan is for regular PD in controlled environment for 48-72 hours to see if uremia and volume are controllable on this regimen.  Patient is anxious to do PD as an OP.  72 hours in, so far is working- volume status is much better- is hypotensive on his regimen. I told him he needs to work on getting all of the appropriate supplies at home so can continue because we know that this regimen will work for him. continue treatments and if no events could be discharged soon.   It is noted that patient wishes to change to [place].  I really do not see that happening as an aside unless he were to change into a completely different person (weekly ER visits, mult issues with noncompliance).   2. HTN/volume- is overloaded but PD has helped with that - on coreg 25 BID, minoxidil 2.5 BID, hydralazine 100 TID-  lisinopril 10-    - blood pressure much better - am going to stop hydralazine and  minoxidil- continue only coreg and lisinopril.  probably does not need as much blood pressure meds that he is on but would need continued compliance to know for sure. Also put him only on 1.5% fluid 3. Anemia- severe-  iron stores OK-  am giving aranesp 4. Secondary hyperparathyroidism- on sensipar-on sevelamer as well 5. Hyperkalemia- resolved 6. Dispo- could probably be discharged once blood pressure is stable and he is able to get around. I put in on the patient to get the appropriate supplies which he should be able to do and should be enough motivation if he really wants to continue PD- if continues to be a problem then I agree with his primary nephrologist needs to go back to HD   [place]: Basic Metabolic Panel:  Recent Labs Lab 11/30/14 0910 12/01/14 0351 12/02/14 0438  NA 136 135 132*  K 4.5 4.8 4.0  CL 98* 97* 95*  CO2 21* 23 23  GLUCOSE 162* 127* 139*  BUN 90* 86* 74*  CREATININE 20.27* 20.46* 19.06*  CALCIUM 7.4* 7.0* 6.8*  PHOS 7.3* 7.0* 5.6*   Liver Function Tests:  Recent Labs Lab  11/26/14 2347  11/30/14 0910 12/01/14 0351 12/02/14 0438  AST 62*  --   --   --   --   ALT 19  --   --   --   --   ALKPHOS 110  --   --   --   --   BILITOT 0.8  --   --   --   --   PROT 6.7  --   --   --   --   ALBUMIN 2.5*  < > 2.0* 1.9* 1.8*  < > = values in this interval not displayed. No results for input(s): LIPASE, AMYLASE in the last 168 hours. No results for input(s): AMMONIA in the last 168 hours. CBC:  Recent Labs Lab 11/26/14 2347 11/28/14 2122 11/29/14 1025 12/01/14 0350 12/01/14 0351 12/02/14 0458  WBC 5.7 5.7 5.2 6.2 6.2 5.2  NEUTROABS 4.3  --   --   --   --   --   HGB 8.0* 8.2* 7.4* 7.8* 7.8* 7.8*  HCT 24.7* 24.8* 22.4* 23.9* 23.6* 23.5*  MCV 84.9 82.7 83.9 82.1 81.9 82.2  PLT 199 220 205 223 220 223   Cardiac Enzymes: No results for input(s): CKTOTAL, CKMB, CKMBINDEX, TROPONINI in the last 168 hours. CBG: No results for  input(s): GLUCAP in the last 168 hours.  Iron Studies:   Recent Labs  11/30/14 0910  IRON 85  TIBC 203*  FERRITIN 82   Studies/Results: No results found. Medications: Infusions: . heparin 1,900 Units/hr (12/02/14 0723)    Scheduled Medications: . atorvastatin  40 mg Oral q1800  . carvedilol  25 mg Oral BID WC  . cinacalcet  30 mg Oral Q breakfast  . darbepoetin (ARANESP) injection - NON-DIALYSIS  150 mcg Subcutaneous Q Wed-1800  . dialysis solution 2.5% low-MG/low-CA   Intraperitoneal Q24H  . docusate sodium  100 mg Oral BID  . gentamicin cream  1 application Topical Daily  . hydrALAZINE  25 mg Oral TID  . lisinopril  10 mg Oral Daily  . minoxidil  2.5 mg Oral BID  . pantoprazole  40 mg Oral Daily  . predniSONE  5 mg Oral Q breakfast  . sevelamer carbonate  1,600 mg Oral TID WC  . warfarin  10 mg Oral ONCE-1800  . Warfarin - Pharmacist Dosing Inpatient   Does not apply q1800    have reviewed scheduled and prn medications.  Physical Exam: General: resting- polite- says he feels better Heart: RRR Lungs: mostly clear Abdomen: soft, non tender- fluid in Extremities: wrinkled extremities- appears as if have lost previous fluid Dialysis Access: PD cath as well as HD cath     12/02/2014,9:03 AM  LOS: 3 days

## 2014-12-02 NOTE — Progress Notes (Addendum)
Progress Note  Frank [name]:[id number] DOB: 1966/06/15 DOA: 11/29/2014 PCP: Angelica Chessman, MD  Admit HPI / Brief Narrative: Frank [name] is a 49 y.o. BM PMHx ESRD on peritoneal dialysis with when necessary hemodialysis, diabetes mellitus, anasarca, hypertension, chronic pain syndrome, chronic noncompliance with medical regimen.  Appears the patient was seen in the ED for complaints of chest pain on 5/7, 5/8, and then 5/9-10 and admitted for chest on 5/10. The patient is presenting with complaints of chest pain. This started this afternoon. Pain is located in the epigastric region and progressively radiating to his left arm. He also has pain in his right arm and swelling of the right arm as well as right side of the face. He denies any trauma or injury. He mentions he is compliant with all his medications and take them regularly. He mentions he has taken his Saturday and Sunday dose of Coumadin. He mentions he does peritoneal dialysis regularly and he has done peritoneal dialysis and 11/28/2014. He mentions he was last hemodialyzed 2 weeks ago. He mentions he is in the process of switching to [facility] for continuation of his hemodialysis from [place].   HPI/Subjective: Seen with sister at bedside, denies any shortness of breath, has some nausea especially after eating. Patient reported that he's been on peritoneal dialysis for the past 5 years.  Assessment/Plan: Accelerated hypertension -Presented with blood pressure of 200/120, admitted to stepdown initially. -Started a nitroglycerin drip, discontinued after initiation of oral medications. -BP is controlled now, likely this is volume issue and after initiation of proper dialysis blood pressure is not longer a problem. -Hydralazine and minoxidil discontinued by nephrology as blood pressure is better controlled than ever.  acute on chronic combined CHF/dilated cardiomyopathy/pericardial effusion. -Combined CHF, LVEF of  35-40% with grade 2 diastolic dysfunction according to echo on 11/30/2014. -Most likely secondary to noncompliance to medical regimen secondary to accelerated hypertension. -Symptoms improved after removal of fluids by PD.  Pulmonary hypertension -See accelerated hypertension  ESRD -Getting peritoneal dialysis, he reported for the past 5 years. -From admission appears to be noncompliant with dialysis, patient reported that he had problems with the PD supplies. -Blood pressure and volume status improved after initiation of PT, per nephrology.  Anemia -Hemoglobin of 7.8, likely secondary to ESRD. -Asymptomatic, per nephrology for iron supplementation and Aranesp.  Chest pain -Troponins negative -Comparison of admission EKG to EKG from 11/17/2014 no significant change   Chronic anticoagulation for PE/acute/subacute DVT -Patient was on chronic anticoagulation for DVT happened last year. -Coumadin restarted to cover for acute/subacute right upper extremity DVT.  Noncompliance with medical treatment -Patient subtherapeutic INR for patient's poor compliance  Hyperkalemia. -Resolved, monitor closely -Potassium goal>4  Hypocalcemia -Chronic  Hypomagnesemia -Magnesium goal> 2 -1 gm magnesium  Chronic anemia. -Continue close monitoring after starting him on heparin.   Code Status: FULL Family Communication: no family present at time of exam Disposition Plan: Nephrology    Consultants: Dr. Roney Jaffe (nephrology)   Procedure/Significant Events: 3/13 echocardiogram;- Left ventricle: moderate LVH. LVEF= 30% to 35%. Diffuse hypokinesis.  - Left atrium: severely dilated.- Right ventricle:  moderately dilated. - Right atrium: mildly dilated. CVP=15 mm Hg. - Pulmonary arteries: PA peak pressure: 40 mm Hg (S). - Pericardium, extracardiac: A small pericardial effusion with moderate collection at anterior base around right atrium. 5/11 echocardiogram;Left ventricle:  mildconcentric hypertrophy. -LVEF 35% to 40%.Diffuse hypokinesis.  -(grade 2 diastolicdysfunction). - Left atrium: severely dilated.- Right ventricle: severely dilated. - Right atrium: severely dilated. -  Pulmonary arteries: PApeak pressure: 36 mm Hg (S). -Pericardium, extracardiac: A small to moderate, free-flowing pericardial effusion no evidence of hemodynamic compromise.  5/11 right upper extremity venous duplex; Indeterminate age DVT of the right internal jugular vein. - Mixed acute and chronic DVT of the right brachial vein.  -Subacute superficial thrombosis of the right basilic vein.   Culture NA  Antibiotics: NA  DVT prophylaxis: Heparin/SCD   Devices   LINES / TUBES:  PD cath    Continuous Infusions: . heparin 1,900 Units/hr (12/02/14 1252)    Objective: VITAL SIGNS: Temp: 98.2 F (36.8 C) (05/13 0949) Temp Source: Oral (05/13 0949) BP: 138/82 mmHg (05/13 0949) Pulse Rate: 81 (05/13 0949) SPO2; FIO2:   Intake/Output Summary (Last 24 hours) at 12/02/14 1447 Last data filed at 12/02/14 1013  Gross per 24 hour  Intake 2281.5 ml  Output   1286 ml  Net  995.5 ml     Exam: General: A/O 4, NAD, No acute respiratory distress Lungs: Clear to auscultation bilaterally without wheezes or crackles Cardiovascular: Regular rate and rhythm without murmur gallop or rub normal S1 and S2 Abdomen: Nontender, distended, soft, bowel sounds positive, no rebound, no ascites, no appreciable mass Extremities: No significant cyanosis, clubbing, or edema bilateral lower extremities    Data Reviewed: Basic Metabolic Panel:  Recent Labs Lab 11/28/14 2122 11/29/14 1025 11/29/14 1430 11/30/14 0910 12/01/14 0351 12/02/14 0438  NA 138 134*  --  136 135 132*  K 6.0* 6.3*  --  4.5 4.8 4.0  CL 100* 100*  --  98* 97* 95*  CO2 16* 18*  --  21* 23 23  GLUCOSE 79 111*  --  162* 127* 139*  BUN 106* 107*  --  90* 86* 74*  CREATININE 23.04* 22.98*  --  20.27* 20.46*  19.06*  CALCIUM 7.4* 7.2*  --  7.4* 7.0* 6.8*  MG  --   --  1.9  --   --   --   PHOS  --  8.0*  --  7.3* 7.0* 5.6*   Liver Function Tests:  Recent Labs Lab 11/26/14 2347 11/29/14 1025 11/30/14 0910 12/01/14 0351 12/02/14 0438  AST 62*  --   --   --   --   ALT 19  --   --   --   --   ALKPHOS 110  --   --   --   --   BILITOT 0.8  --   --   --   --   PROT 6.7  --   --   --   --   ALBUMIN 2.5* 2.2* 2.0* 1.9* 1.8*   No results for input(s): LIPASE, AMYLASE in the last 168 hours. No results for input(s): AMMONIA in the last 168 hours. CBC:  Recent Labs Lab 11/26/14 2347 11/28/14 2122 11/29/14 1025 12/01/14 0350 12/01/14 0351 12/02/14 0458  WBC 5.7 5.7 5.2 6.2 6.2 5.2  NEUTROABS 4.3  --   --   --   --   --   HGB 8.0* 8.2* 7.4* 7.8* 7.8* 7.8*  HCT 24.7* 24.8* 22.4* 23.9* 23.6* 23.5*  MCV 84.9 82.7 83.9 82.1 81.9 82.2  PLT 199 220 205 223 220 223   Cardiac Enzymes: No results for input(s): CKTOTAL, CKMB, CKMBINDEX, TROPONINI in the last 168 hours. BNP (last 3 results)  Recent Labs  09/30/14 1556 11/17/14 0543 11/26/14 2352  BNP >4500.0* 2888.3* >4500.0*    ProBNP (last 3 results)  Recent Labs  01/02/14 0317  01/18/14 0048 04/15/14 0809  PROBNP >70000.0* >70000.0* >70000.0*    CBG: No results for input(s): GLUCAP in the last 168 hours.  Recent Results (from the past 240 hour(s))  MRSA PCR Screening     Status: None   Collection Time: 11/29/14  4:09 AM  Result Value Ref Range Status   MRSA by PCR NEGATIVE NEGATIVE Final    Comment:        The GeneXpert MRSA Assay (FDA approved for NASAL specimens only), is one component of a comprehensive MRSA colonization surveillance program. It is not intended to diagnose MRSA infection nor to guide or monitor treatment for MRSA infections.      Studies:  Recent x-ray studies have been reviewed in detail by the Attending Physician  Scheduled Meds:  Scheduled Meds: . atorvastatin  40 mg Oral q1800  .  carvedilol  25 mg Oral BID WC  . cinacalcet  30 mg Oral Q breakfast  . darbepoetin (ARANESP) injection - NON-DIALYSIS  150 mcg Subcutaneous Q Wed-1800  . dialysis solution 1.5% low-MG/low-CA   Intraperitoneal Q24H  . docusate sodium  100 mg Oral BID  . gentamicin cream  1 application Topical Daily  . lisinopril  10 mg Oral Daily  . pantoprazole  40 mg Oral Daily  . predniSONE  5 mg Oral Q breakfast  . sevelamer carbonate  1,600 mg Oral TID WC  . warfarin  10 mg Oral ONCE-1800  . Warfarin - Pharmacist Dosing Inpatient   Does not apply q1800    Time spent on care of this patient: 40 mins   [facility] A , MD  [facility] Office  [phone] Pager [phone]  On-Call/Text Page:      [email or web address]      password [email or web address]  If 7PM-7AM, please contact night-coverage [email or web address] Password [email or web address] 12/02/2014, 2:47 PM   LOS: 3 days

## 2014-12-02 NOTE — Progress Notes (Signed)
PT Cancellation Note  Patient Details Name: Frank [name] MRN: [id number] DOB: 10-Aug-1965   Cancelled Treatment:    Reason Eval/Treat Not Completed: Patient at procedure or test/unavailable (on PD, asleep)   [name] 12/02/2014, 2:57 PM Tresa Endo PT [phone]

## 2014-12-03 DIAGNOSIS — I82401 Acute embolism and thrombosis of unspecified deep veins of right lower extremity: Secondary | ICD-10-CM

## 2014-12-03 LAB — PROTIME-INR
INR: 1.77 — ABNORMAL HIGH (ref 0.00–1.49)
PROTHROMBIN TIME: 20.7 s — AB (ref 11.6–15.2)

## 2014-12-03 LAB — RENAL FUNCTION PANEL
ALBUMIN: 1.8 g/dL — AB (ref 3.5–5.0)
Anion gap: 12 (ref 5–15)
BUN: 61 mg/dL — ABNORMAL HIGH (ref 6–20)
CO2: 25 mmol/L (ref 22–32)
Calcium: 7.1 mg/dL — ABNORMAL LOW (ref 8.9–10.3)
Chloride: 95 mmol/L — ABNORMAL LOW (ref 101–111)
Creatinine, Ser: 17.92 mg/dL — ABNORMAL HIGH (ref 0.61–1.24)
GFR calc Af Amer: 3 mL/min — ABNORMAL LOW (ref >60)
GFR calc non Af Amer: 3 mL/min — ABNORMAL LOW (ref >60)
GLUCOSE: 115 mg/dL — AB (ref 65–99)
PHOSPHORUS: 5.9 mg/dL — AB (ref 2.5–4.6)
Potassium: 3.7 mmol/L (ref 3.5–5.1)
SODIUM: 132 mmol/L — AB (ref 135–145)

## 2014-12-03 LAB — CBC
HEMATOCRIT: 24.5 % — AB (ref 39.0–52.0)
Hemoglobin: 8 g/dL — ABNORMAL LOW (ref 13.0–17.0)
MCH: 27 pg (ref 26.0–34.0)
MCHC: 32.7 g/dL (ref 30.0–36.0)
MCV: 82.8 fL (ref 78.0–100.0)
Platelets: 241 10*3/uL (ref 150–400)
RBC: 2.96 MIL/uL — ABNORMAL LOW (ref 4.22–5.81)
RDW: 14.9 % (ref 11.5–15.5)
WBC: 5.5 10*3/uL (ref 4.0–10.5)

## 2014-12-03 LAB — HEPARIN LEVEL (UNFRACTIONATED): HEPARIN UNFRACTIONATED: 0.53 [IU]/mL (ref 0.30–0.70)

## 2014-12-03 MED ORDER — BISACODYL 10 MG RE SUPP
10.0000 mg | Freq: Once | RECTAL | Status: DC
  Filled 2014-12-03: qty 1

## 2014-12-03 MED ORDER — WARFARIN SODIUM 5 MG PO TABS
5.0000 mg | ORAL_TABLET | Freq: Once | ORAL | Status: AC
  Administered 2014-12-03: 5 mg via ORAL
  Filled 2014-12-03: qty 1

## 2014-12-03 NOTE — Progress Notes (Signed)
Patient refuses to wear CPAP tonight.

## 2014-12-03 NOTE — Progress Notes (Signed)
Subjective:  Only complaining of some epigastric discomfort today- is on protonix- says he has enough fluid for PD for a couple days and can get more on Monday- blood pressure has been great.  I told him I thought he could go home and he agrees.  Objective Vital signs in last 24 hours: Filed Vitals:   12/02/14 1718 12/02/14 2024 12/03/14 0424 12/03/14 1000  BP: 128/66 123/69 132/70 135/72  Pulse: 78 79 71 68  Temp: 98.4 F (36.9 C) 98.8 F (37.1 C) 98.5 F (36.9 C) 98.2 F (36.8 C)  TempSrc: Oral Oral Oral Oral  Resp: _0 Height:      Weight:  80 kg (176 lb 5.9 oz)    SpO2: 96% 98% 100% 100%   Weight change: -2.3 kg (-5 lb 1.1 oz)  Intake/Output Summary (Last 24 hours) at 12/03/14 1103 Last data filed at 12/03/14 1100  Gross per 24 hour  Intake 1681.04 ml  Output      0 ml  Net 1681.04 ml    Assessment/ Plan: Pt is a 49 y.o. yo male who was admitted on 11/29/2014 with ESRD noncompliance with PD/malignant HTN/volume overload and uremia  Assessment/Plan: 1. ESRD- Patient did well with 72 hour trial of PD- so we know it will work for him IF HE DOES IT.  Patient is anxious to do PD as an OP so I explained to him what he needs to do.  He is stable for discharge from my standpoint.    It is noted that patient wishes to change to [place].  I really do not see that happening as an aside unless he were to change into a completely different person (weekly ER visits, mult issues with noncompliance).   2. HTN/volume- was overloaded but PD has helped with that - on coreg 25 BID, and  lisinopril 10 only and controlled.   3. Anemia- severe-  iron stores OK-  Also on aranesp- has improved 4. Secondary hyperparathyroidism- on sensipar-on sevelamer as well 5. Hyperkalemia- resolved 6. Dispo- could be discharged today in my opinion. I put in on the patient to get the appropriate supplies which he should be able to do and should be enough motivation if he really wants to continue PD-  if continues to be a problem then I agree with his primary nephrologist in [place] needs to go back to HD   [place]: Basic Metabolic Panel:  Recent Labs Lab 12/01/14 0351 12/02/14 0438 12/03/14 0601  NA 135 132* 132*  K 4.8 4.0 3.7  CL 97* 95* 95*  CO2 _1 GLUCOSE 127* 139* 115*  BUN 86* 74* 61*  CREATININE 20.46* 19.06* 17.92*  CALCIUM 7.0* 6.8* 7.1*  PHOS 7.0* 5.6* 5.9*   Liver Function Tests:  Recent Labs Lab 11/26/14 2347  12/01/14 0351 12/02/14 0438 12/03/14 0601  AST 62*  --   --   --   --   ALT 19  --   --   --   --   ALKPHOS 110  --   --   --   --   BILITOT 0.8  --   --   --   --   PROT 6.7  --   --   --   --   ALBUMIN 2.5*  < > 1.9* 1.8* 1.8*  < > = values in this interval not displayed. No results for input(s): LIPASE, AMYLASE in the last  168 hours. No results for input(s): AMMONIA in the last 168 hours. CBC:  Recent Labs Lab 11/26/14 2347  11/29/14 1025 12/01/14 0350 12/01/14 0351 12/02/14 0458 12/03/14 0601  WBC 5.7  < > 5.2 6.2 6.2 5.2 5.5  NEUTROABS 4.3  --   --   --   --   --   --   HGB 8.0*  < > 7.4* 7.8* 7.8* 7.8* 8.0*  HCT 24.7*  < > 22.4* 23.9* 23.6* 23.5* 24.5*  MCV 84.9  < > 83.9 82.1 81.9 82.2 82.8  PLT 199  < > 205 223 220 223 241  < > = values in this interval not displayed. Cardiac Enzymes: No results for input(s): CKTOTAL, CKMB, CKMBINDEX, TROPONINI in the last 168 hours. CBG: No results for input(s): GLUCAP in the last 168 hours.  Iron Studies:  No results for input(s): IRON, TIBC, TRANSFERRIN, FERRITIN in the last 72 hours. Studies/Results: Dg Abd 1 View  12/02/2014   CLINICAL DATA:  Vomiting for 4 days.  EXAM: ABDOMEN - 1 VIEW  COMPARISON:  CT scan of November 05, 2014. Radiographs of May 12, 2014.  FINDINGS: Peritoneal dialysis catheter is noted in the pelvis. Mildly dilated small bowel loops are noted in the lower abdomen. No colonic dilatation is noted. No radio-opaque calculi or  other significant radiographic abnormality are seen.  IMPRESSION: Mildly dilated small bowel loops are noted in the lower abdomen which may represent focal ileus. Followup radiographs are recommended to rule out obstruction.   Electronically Signed   By: Marijo Conception, M.D.   On: 12/02/2014 20:42   Medications: Infusions: . heparin 1,900 Units/hr (12/02/14 1252)    Scheduled Medications: . atorvastatin  40 mg Oral q1800  . carvedilol  25 mg Oral BID WC  . cinacalcet  30 mg Oral Q breakfast  . darbepoetin (ARANESP) injection - NON-DIALYSIS  150 mcg Subcutaneous Q Wed-1800  . dialysis solution 1.5% low-MG/low-CA   Intraperitoneal Q24H  . docusate sodium  100 mg Oral BID  . gentamicin cream  1 application Topical Daily  . lisinopril  10 mg Oral Daily  . pantoprazole  40 mg Oral Daily  . predniSONE  5 mg Oral Q breakfast  . sevelamer carbonate  1,600 mg Oral TID WC  . warfarin  5 mg Oral ONCE-1800  . Warfarin - Pharmacist Dosing Inpatient   Does not apply q1800    have reviewed scheduled and prn medications.  Physical Exam: General: only c/o epigastric pain Heart: RRR Lungs: mostly clear Abdomen: soft, non tender- fluid in- no peritoneal signs Extremities: wrinkled extremities- appears as if have lost previous fluid Dialysis Access: PD cath as well as HD cath  - has been dressed while here   12/03/2014,11:03 AM  LOS: 4 days

## 2014-12-03 NOTE — Progress Notes (Signed)
Progress Note  Frank [name] ATF:[id number] DOB: 1965/07/29 DOA: 11/29/2014 PCP: Angelica Chessman, MD  Admit HPI / Brief Narrative: Frank [name] is a 49 y.o. BM PMHx ESRD on peritoneal dialysis with when necessary hemodialysis, diabetes mellitus, anasarca, hypertension, chronic pain syndrome, chronic noncompliance with medical regimen.  Appears the patient was seen in the ED for complaints of chest pain on 5/7, 5/8, and then 5/9-10 and admitted for chest on 5/10. The patient is presenting with complaints of chest pain. This started this afternoon. Pain is located in the epigastric region and progressively radiating to his left arm. He also has pain in his right arm and swelling of the right arm as well as right side of the face. He denies any trauma or injury. He mentions he is compliant with all his medications and take them regularly. He mentions he has taken his Saturday and Sunday dose of Coumadin. He mentions he does peritoneal dialysis regularly and he has done peritoneal dialysis and 11/28/2014. He mentions he was last hemodialyzed 2 weeks ago. He mentions he is in the process of switching to [facility] for continuation of his hemodialysis from [place].   HPI/Subjective: Had some nausea and vomiting, x-ray shows early ileus. Did not have bowel movement for about a week, will give suppository. Per nephrology he can go home from their standpoint.  Barrier to discharge: Needs INR to be above 2 prior to discharge. INR is 1.7 today.  Assessment/Plan:  Accelerated hypertension -Presented with blood pressure of 200/120, admitted to stepdown initially. -Started a nitroglycerin drip, discontinued after initiation of oral medications. -BP is controlled now, likely this is volume issue and after initiation of proper dialysis blood pressure is not longer a problem. -Hydralazine and minoxidil discontinued by nephrology as blood pressure is better controlled than ever. -Blood pressure  appears to be controlled now.  Acute on chronic combined CHF/dilated cardiomyopathy/pericardial effusion. -Combined CHF, LVEF of 35-40% with grade 2 diastolic dysfunction according to echo on 11/30/2014. -Most likely secondary to noncompliance to medical regimen secondary to accelerated hypertension. -Symptoms improved after removal of fluids by PD.  Pulmonary hypertension -See accelerated hypertension  ESRD -Getting peritoneal dialysis, he reported for the past 5 years. -From admission appears to be noncompliant with dialysis, patient reported that he had problems with the PD supplies. -Blood pressure and volume status improved after initiation of PT, per nephrology.  Anemia -Hemoglobin of 7.8, likely secondary to ESRD. -Asymptomatic, per nephrology for iron supplementation and Aranesp.  Chest pain -Troponins negative -Comparison of admission EKG to EKG from 11/17/2014 no changes from previous tracing.  Chronic anticoagulation for PE/acute/subacute DVT -Patient was on chronic anticoagulation for DVT happened last year. -Coumadin restarted to cover for acute/subacute right upper extremity DVT.  Noncompliance with medical treatment -Patient subtherapeutic INR for patient's poor compliance  Hyperkalemia. -Resolved, monitor closely -Potassium goal>4  Hypocalcemia -Chronic  Hypomagnesemia -Magnesium goal> 2 -1 gm magnesium  Chronic anemia. -Continue close monitoring after starting him on heparin.   Code Status: FULL Family Communication: no family present at time of exam Disposition Plan: Nephrology    Consultants: Dr. Roney Jaffe (nephrology)   Procedure/Significant Events: 3/13 echocardiogram;- Left ventricle: moderate LVH. LVEF= 30% to 35%. Diffuse hypokinesis.  - Left atrium: severely dilated.- Right ventricle:  moderately dilated. - Right atrium: mildly dilated. CVP=15 mm Hg. - Pulmonary arteries: PA peak pressure: 40 mm Hg (S). - Pericardium,  extracardiac: A small pericardial effusion with moderate collection at anterior base around right atrium. 5/11  echocardiogram;Left ventricle: mildconcentric hypertrophy. -LVEF 35% to 40%.Diffuse hypokinesis.  -(grade 2 diastolicdysfunction). - Left atrium: severely dilated.- Right ventricle: severely dilated. - Right atrium: severely dilated. - Pulmonary arteries: PApeak pressure: 36 mm Hg (S). -Pericardium, extracardiac: A small to moderate, free-flowing pericardial effusion no evidence of hemodynamic compromise.  5/11 right upper extremity venous duplex; Indeterminate age DVT of the right internal jugular vein. - Mixed acute and chronic DVT of the right brachial vein.  -Subacute superficial thrombosis of the right basilic vein.   Culture NA  Antibiotics: NA  DVT prophylaxis: Heparin/SCD   Devices   LINES / TUBES:  PD cath    Continuous Infusions: . heparin 1,900 Units/hr (12/02/14 1252)    Objective: VITAL SIGNS: Temp: 98.2 F (36.8 C) (05/14 1000) Temp Source: Oral (05/14 1000) BP: 135/72 mmHg (05/14 1000) Pulse Rate: 68 (05/14 1000) SPO2; FIO2:   Intake/Output Summary (Last 24 hours) at 12/03/14 1156 Last data filed at 12/03/14 1100  Gross per 24 hour  Intake 1681.04 ml  Output      0 ml  Net 1681.04 ml     Exam: General: A/O 4, NAD, No acute respiratory distress Lungs: Clear to auscultation bilaterally without wheezes or crackles Cardiovascular: Regular rate and rhythm without murmur gallop or rub normal S1 and S2 Abdomen: Nontender, distended, soft, bowel sounds positive, no rebound, no ascites, no appreciable mass Extremities: No significant cyanosis, clubbing, or edema bilateral lower extremities    Data Reviewed: Basic Metabolic Panel:  Recent Labs Lab 11/29/14 1025 11/29/14 1430 11/30/14 0910 12/01/14 0351 12/02/14 0438 12/03/14 0601  NA 134*  --  136 135 132* 132*  K 6.3*  --  4.5 4.8 4.0 3.7  CL 100*  --  98* 97* 95* 95*    CO2 18*  --  21* _0 GLUCOSE 111*  --  162* 127* 139* 115*  BUN 107*  --  90* 86* 74* 61*  CREATININE 22.98*  --  20.27* 20.46* 19.06* 17.92*  CALCIUM 7.2*  --  7.4* 7.0* 6.8* 7.1*  MG  --  1.9  --   --   --   --   PHOS 8.0*  --  7.3* 7.0* 5.6* 5.9*   Liver Function Tests:  Recent Labs Lab 11/26/14 2347 11/29/14 1025 11/30/14 0910 12/01/14 0351 12/02/14 0438 12/03/14 0601  AST 62*  --   --   --   --   --   ALT 19  --   --   --   --   --   ALKPHOS 110  --   --   --   --   --   BILITOT 0.8  --   --   --   --   --   PROT 6.7  --   --   --   --   --   ALBUMIN 2.5* 2.2* 2.0* 1.9* 1.8* 1.8*   No results for input(s): LIPASE, AMYLASE in the last 168 hours. No results for input(s): AMMONIA in the last 168 hours. CBC:  Recent Labs Lab 11/26/14 2347  11/29/14 1025 12/01/14 0350 12/01/14 0351 12/02/14 0458 12/03/14 0601  WBC 5.7  < > 5.2 6.2 6.2 5.2 5.5  NEUTROABS 4.3  --   --   --   --   --   --   HGB 8.0*  < > 7.4* 7.8* 7.8* 7.8* 8.0*  HCT 24.7*  < > 22.4* 23.9* 23.6* 23.5* 24.5*  MCV 84.9  < >  83.9 82.1 81.9 82.2 82.8  PLT 199  < > 205 223 220 223 241  < > = values in this interval not displayed. Cardiac Enzymes: No results for input(s): CKTOTAL, CKMB, CKMBINDEX, TROPONINI in the last 168 hours. BNP (last 3 results)  Recent Labs  09/30/14 1556 11/17/14 0543 11/26/14 2352  BNP >4500.0* 2888.3* >4500.0*    ProBNP (last 3 results)  Recent Labs  01/02/14 0317 01/18/14 0048 04/15/14 0809  PROBNP >70000.0* >70000.0* >70000.0*    CBG: No results for input(s): GLUCAP in the last 168 hours.  Recent Results (from the past 240 hour(s))  MRSA PCR Screening     Status: None   Collection Time: 11/29/14  4:09 AM  Result Value Ref Range Status   MRSA by PCR NEGATIVE NEGATIVE Final    Comment:        The GeneXpert MRSA Assay (FDA approved for NASAL specimens only), is one component of a comprehensive MRSA colonization surveillance program. It is  not intended to diagnose MRSA infection nor to guide or monitor treatment for MRSA infections.      Studies:  Recent x-ray studies have been reviewed in detail by the Attending Physician  Scheduled Meds:  Scheduled Meds: . atorvastatin  40 mg Oral q1800  . carvedilol  25 mg Oral BID WC  . cinacalcet  30 mg Oral Q breakfast  . darbepoetin (ARANESP) injection - NON-DIALYSIS  150 mcg Subcutaneous Q Wed-1800  . dialysis solution 1.5% low-MG/low-CA   Intraperitoneal Q24H  . docusate sodium  100 mg Oral BID  . gentamicin cream  1 application Topical Daily  . lisinopril  10 mg Oral Daily  . pantoprazole  40 mg Oral Daily  . predniSONE  5 mg Oral Q breakfast  . sevelamer carbonate  1,600 mg Oral TID WC  . warfarin  5 mg Oral ONCE-1800  . Warfarin - Pharmacist Dosing Inpatient   Does not apply q1800    Time spent on care of this patient: 40 mins   [facility] A , MD  [facility] Office  [phone] Pager [phone]  On-Call/Text Page:      [email or web address]      password [email or web address]  If 7PM-7AM, please contact night-coverage [email or web address] Password [email or web address] 12/03/2014, 11:56 AM   LOS: 4 days

## 2014-12-03 NOTE — Progress Notes (Signed)
ANTICOAGULATION CONSULT NOTE - Follow Up Consult  Pharmacy Consult for warfarin + heparin Indication: DVT  Allergies  Allergen Reactions  . Ferrlecit [Na Ferric Gluc Cplx In Sucrose] Shortness Of Breath, Swelling and Other (See Comments)    Swelling in throat  . Darvocet [Propoxyphene N-Acetaminophen] Hives    Patient Measurements: Height: 6' 1" (185.4 cm) Weight: 176 lb 5.9 oz (80 kg) IBW/kg (Calculated) : 79.9  Vital Signs: Temp: 98.5 F (36.9 C) (05/14 0424) Temp Source: Oral (05/14 0424) BP: 132/70 mmHg (05/14 0424) Pulse Rate: 71 (05/14 0424)  Labs:  Recent Labs  12/01/14 0350 12/01/14 0351  12/02/14 0438 12/02/14 0458 12/02/14 1858 12/03/14 0601  HGB 7.8* 7.8*  --   --  7.8*  --  8.0*  HCT 23.9* 23.6*  --   --  23.5*  --  24.5*  PLT 223 220  --   --  223  --  241  LABPROT 16.2*  --   --   --  15.6*  --  20.7*  INR 1.29  --   --   --  1.23  --  1.77*  HEPARINUNFRC  --  0.18*  < >  --  0.17* 0.50 0.53  CREATININE  --  20.46*  --  19.06*  --   --  17.92*  < > = values in this interval not displayed.  Estimated Creatinine Clearance: 5.6 mL/min (by C-G formula based on Cr of 17.92).   Medications:  Scheduled:  . atorvastatin  40 mg Oral q1800  . carvedilol  25 mg Oral BID WC  . cinacalcet  30 mg Oral Q breakfast  . darbepoetin (ARANESP) injection - NON-DIALYSIS  150 mcg Subcutaneous Q Wed-1800  . dialysis solution 1.5% low-MG/low-CA   Intraperitoneal Q24H  . docusate sodium  100 mg Oral BID  . gentamicin cream  1 application Topical Daily  . lisinopril  10 mg Oral Daily  . pantoprazole  40 mg Oral Daily  . predniSONE  5 mg Oral Q breakfast  . sevelamer carbonate  1,600 mg Oral TID WC  . Warfarin - Pharmacist Dosing Inpatient   Does not apply q1800    Assessment: 49 yo m admitted on 5/9 for SOB, volume overload, non compliance with PD. Patient developed new DVT with h/o PE, on warfarin PTA 8 mg daily but noncompliant. INR 1.18 on admission, 1.77 this AM  after two doses of 10 mg. Will give lower dose tonight since big increase from yesterday (1.23>1.77). HL this AM is therapeutic at 0.53. Hgb stable at 8, plts 241, no s/s of bleeding.   Goal of Therapy:  Heparin level 0.3-0.7 units/ml  INR 2-3 Monitor platelets by anticoagulation protocol: Yes   Plan:  Continue heparin at 1900 units/hr Daily HL and CBC Warfarin 5 mg x 1 tonight INR in AM to determine further dosing Monitor hgb/plts, s/s of bleeding, d/c heparin when INR >2  Cassie L. Nicole Kindred, PharmD Clinical Pharmacy Resident Pager: [phone] 12/03/2014 8:29 AM

## 2014-12-03 NOTE — Progress Notes (Signed)
PT Cancellation Note  Patient Details Name: Frank [name] MRN: [id number] DOB: [birth year]   Cancelled Treatment:    Reason Eval/Treat Not Completed: Medical issues which prohibited therapy, checked back on pt in the PM, pt reports that his stomach is hurting and continues to refuse to get up.    [place], [place] 12/03/2014, 3:02 PM

## 2014-12-03 NOTE — Progress Notes (Signed)
PT Cancellation Note  Patient Details Name: Frank [name] MRN: [id number] DOB: 09/12/1965   Cancelled Treatment:    Reason Eval/Treat Not Completed: Patient declined, no reason specified, pt adamantly refused mobility this morning. Will check back as time allows.    [place], [place] 12/03/2014, 12:09 PM

## 2014-12-04 DIAGNOSIS — I420 Dilated cardiomyopathy: Secondary | ICD-10-CM

## 2014-12-04 LAB — CBC
HEMATOCRIT: 29.7 % — AB (ref 39.0–52.0)
Hemoglobin: 9.7 g/dL — ABNORMAL LOW (ref 13.0–17.0)
MCH: 27.1 pg (ref 26.0–34.0)
MCHC: 32.7 g/dL (ref 30.0–36.0)
MCV: 83 fL (ref 78.0–100.0)
Platelets: 273 10*3/uL (ref 150–400)
RBC: 3.58 MIL/uL — ABNORMAL LOW (ref 4.22–5.81)
RDW: 14.4 % (ref 11.5–15.5)
WBC: 6.6 10*3/uL (ref 4.0–10.5)

## 2014-12-04 LAB — RENAL FUNCTION PANEL
ANION GAP: 14 (ref 5–15)
Albumin: 2 g/dL — ABNORMAL LOW (ref 3.5–5.0)
BUN: 52 mg/dL — ABNORMAL HIGH (ref 6–20)
CO2: 26 mmol/L (ref 22–32)
Calcium: 7.7 mg/dL — ABNORMAL LOW (ref 8.9–10.3)
Chloride: 94 mmol/L — ABNORMAL LOW (ref 101–111)
Creatinine, Ser: 17.39 mg/dL — ABNORMAL HIGH (ref 0.61–1.24)
GFR calc Af Amer: 3 mL/min — ABNORMAL LOW (ref >60)
GFR calc non Af Amer: 3 mL/min — ABNORMAL LOW (ref >60)
GLUCOSE: 99 mg/dL (ref 65–99)
POTASSIUM: 4.1 mmol/L (ref 3.5–5.1)
Phosphorus: 5.6 mg/dL — ABNORMAL HIGH (ref 2.5–4.6)
Sodium: 134 mmol/L — ABNORMAL LOW (ref 135–145)

## 2014-12-04 LAB — PROTIME-INR
INR: 2.86 — ABNORMAL HIGH (ref 0.00–1.49)
PROTHROMBIN TIME: 30.2 s — AB (ref 11.6–15.2)

## 2014-12-04 MED ORDER — WARFARIN SODIUM 2 MG PO TABS
2.0000 mg | ORAL_TABLET | Freq: Once | ORAL | Status: AC
  Administered 2014-12-04: 2 mg via ORAL
  Filled 2014-12-04: qty 1

## 2014-12-04 MED ORDER — ONDANSETRON HCL 4 MG PO TABS
4.0000 mg | ORAL_TABLET | Freq: Four times a day (QID) | ORAL | Status: DC | PRN
  Administered 2014-12-04 (×2): 4 mg via ORAL
  Filled 2014-12-04 (×2): qty 1

## 2014-12-04 MED ORDER — LISINOPRIL 20 MG PO TABS
20.0000 mg | ORAL_TABLET | Freq: Every day | ORAL | Status: DC
  Administered 2014-12-05: 20 mg via ORAL
  Filled 2014-12-04: qty 1

## 2014-12-04 MED ORDER — ENOXAPARIN SODIUM 80 MG/0.8ML ~~LOC~~ SOLN
80.0000 mg | Freq: Every day | SUBCUTANEOUS | Status: DC
  Administered 2014-12-04: 80 mg via SUBCUTANEOUS
  Filled 2014-12-04 (×2): qty 0.8

## 2014-12-04 NOTE — Progress Notes (Signed)
RN entered patient room to find his IV pump beeping and his IV catheter laying on his bed and out of his arm. After 1 unsuccessful attempt to replace IV, IV team paged. Patient refused to be stuck any further. He prefers to wait till the morning. Practitioner on-call notified. Patient will be started on lovenox.

## 2014-12-04 NOTE — Progress Notes (Signed)
[place] for Lovenox Indication: DVT  Allergies  Allergen Reactions  . Ferrlecit [Na Ferric Gluc Cplx In Sucrose] Shortness Of Breath, Swelling and Other (See Comments)    Swelling in throat  . Darvocet [Propoxyphene N-Acetaminophen] Hives    Patient Measurements: Height: _0  (185.4 cm) Weight: 176 lb 2.4 oz (79.9 kg) IBW/kg (Calculated) : 79.9  Vital Signs: Temp: 98.7 F (37.1 C) (05/14 2031) Temp Source: Oral (05/14 2031) BP: 151/88 mmHg (05/14 2031) Pulse Rate: 93 (05/14 2031)  Labs:  Recent Labs  12/01/14 0350 12/01/14 0351  12/02/14 0438 12/02/14 0458 12/02/14 1858 12/03/14 0601  HGB 7.8* 7.8*  --   --  7.8*  --  8.0*  HCT 23.9* 23.6*  --   --  23.5*  --  24.5*  PLT 223 220  --   --  223  --  241  LABPROT 16.2*  --   --   --  15.6*  --  20.7*  INR 1.29  --   --   --  1.23  --  1.77*  HEPARINUNFRC  --  0.18*  < >  --  0.17* 0.50 0.53  CREATININE  --  20.46*  --  19.06*  --   --  17.92*  < > = values in this interval not displayed.  Estimated Creatinine Clearance: 5.6 mL/min (by C-G formula based on Cr of 17.92).  Assessment: 49 yo man with new DVT, h/o PE and INR subtherapeutic, for anticoagulation.  Patient has lost IV access and is difficult stick.  Though Lovenox not ideal in patient with ESRD, INR likely therapeutic in morning and will require just 48 hours overlap.    Goal of Therapy:  INR 2-3 Full anticoagulation with Lovenox Monitor platelets by anticoagulation protocol: Yes   Plan:  Lovenox 80 mg SQ q24h  Phillis Knack, PharmD, BCPS  12/04/2014, 12:09 AM

## 2014-12-04 NOTE — Progress Notes (Signed)
Subjective:  Getting PT/INR drawn now- no other complaints- blood pressure is coming up some- he thinks it is due to pain  Objective Vital signs in last 24 hours: Filed Vitals:   12/03/14 1000 12/03/14 1744 12/03/14 2031 12/04/14 0421  BP: 135/72 128/68 151/88 165/94  Pulse: 68 76 93 67  Temp: 98.2 F (36.8 C) 98 F (36.7 C) 98.7 F (37.1 C) 98 F (36.7 C)  TempSrc: Oral Oral Oral Oral  Resp: _0 Height:      Weight:   79.9 kg (176 lb 2.4 oz)   SpO2: 100% 100% 100% 98%   Weight change: -0.1 kg (-3.5 oz)  Intake/Output Summary (Last 24 hours) at 12/04/14 1018 Last data filed at 12/04/14 0459  Gross per 24 hour  Intake    960 ml  Output      0 ml  Net    960 ml    Assessment/ Plan: Pt is a 49 y.o. yo male who was admitted on 11/29/2014 with ESRD noncompliance with PD/malignant HTN/volume overload and uremia  Assessment/Plan: 1. ESRD- Patient did well with 72 hour trial of PD- so we know it will work for him IF HE DOES IT.  Patient is anxious to do PD as an OP so I explained to him what he needs to do.  He is stable for discharge from my standpoint.    It is noted that patient wishes to change to [place].  I really do not see that happening as an aside unless he were to change into a completely different person (weekly ER visits, mult issues with noncompliance).   2. HTN/volume- was overloaded but PD has helped with that - on coreg 25 BID, and  lisinopril 10 only and was controlled.  If stays high for 24 hours can increase lisinopril to 20 daily  3. Anemia- severe-  iron stores OK-  on aranesp- has improved 4. Secondary hyperparathyroidism- on sensipar-on sevelamer as well 5. Hyperkalemia- resolved 6. Dispo- could be discharged today if INR at goal. I put in on the patient to get the appropriate supplies which he should be able to do and should be enough motivation if he really wants to continue PD- if continues to be a problem then I agree with his primary  nephrologist in [place] needs to go back to HD- he will not be transitioning care to [place]: Basic Metabolic Panel:  Recent Labs Lab 12/01/14 0351 12/02/14 0438 12/03/14 0601  NA 135 132* 132*  K 4.8 4.0 3.7  CL 97* 95* 95*  CO2 _1 GLUCOSE 127* 139* 115*  BUN 86* 74* 61*  CREATININE 20.46* 19.06* 17.92*  CALCIUM 7.0* 6.8* 7.1*  PHOS 7.0* 5.6* 5.9*   Liver Function Tests:  Recent Labs Lab 12/01/14 0351 12/02/14 0438 12/03/14 0601  ALBUMIN 1.9* 1.8* 1.8*   No results for input(s): LIPASE, AMYLASE in the last 168 hours. No results for input(s): AMMONIA in the last 168 hours. CBC:  Recent Labs Lab 11/29/14 1025 12/01/14 0350 12/01/14 0351 12/02/14 0458 12/03/14 0601  WBC 5.2 6.2 6.2 5.2 5.5  HGB 7.4* 7.8* 7.8* 7.8* 8.0*  HCT 22.4* 23.9* 23.6* 23.5* 24.5*  MCV 83.9 82.1 81.9 82.2 82.8  PLT 205 223 220 223 241   Cardiac Enzymes: No results for input(s): CKTOTAL, CKMB, CKMBINDEX, TROPONINI in the last 168 hours. CBG: No results for input(s): GLUCAP in the last 168  hours.  Iron Studies:  No results for input(s): IRON, TIBC, TRANSFERRIN, FERRITIN in the last 72 hours. Studies/Results: Dg Abd 1 View  12/02/2014   CLINICAL DATA:  Vomiting for 4 days.  EXAM: ABDOMEN - 1 VIEW  COMPARISON:  CT scan of November 05, 2014. Radiographs of May 12, 2014.  FINDINGS: Peritoneal dialysis catheter is noted in the pelvis. Mildly dilated small bowel loops are noted in the lower abdomen. No colonic dilatation is noted. No radio-opaque calculi or other significant radiographic abnormality are seen.  IMPRESSION: Mildly dilated small bowel loops are noted in the lower abdomen which may represent focal ileus. Followup radiographs are recommended to rule out obstruction.   Electronically Signed   By: Marijo Conception, M.D.   On: 12/02/2014 20:42   Medications: Infusions:    Scheduled Medications: . atorvastatin  40 mg Oral q1800  .  bisacodyl  10 mg Rectal Once  . carvedilol  25 mg Oral BID WC  . cinacalcet  30 mg Oral Q breakfast  . darbepoetin (ARANESP) injection - NON-DIALYSIS  150 mcg Subcutaneous Q Wed-1800  . dialysis solution 1.5% low-MG/low-CA   Intraperitoneal Q24H  . docusate sodium  100 mg Oral BID  . enoxaparin (LOVENOX) injection  80 mg Subcutaneous QHS  . gentamicin cream  1 application Topical Daily  . lisinopril  10 mg Oral Daily  . pantoprazole  40 mg Oral Daily  . predniSONE  5 mg Oral Q breakfast  . sevelamer carbonate  1,600 mg Oral TID WC  . Warfarin - Pharmacist Dosing Inpatient   Does not apply q1800    have reviewed scheduled and prn medications.  Physical Exam: General: only c/o epigastric pain Heart: RRR Lungs: mostly clear Abdomen: soft, non tender- fluid in- no peritoneal signs Extremities: wrinkled extremities- appears as if have lost previous fluid Dialysis Access: PD cath as well as HD cath  - has been dressed while here   12/04/2014,10:18 AM  LOS: 5 days

## 2014-12-04 NOTE — Evaluation (Signed)
Physical Therapy Evaluation Patient Details Name: Frank [name] MRN: [id number] DOB: 08/13/1965 Today's Date: 12/04/2014   History of Present Illness  Frank [name] is a 49 y.o. male with Past medical history of ESRD on peritoneal dialysis with when necessary hemodialysis, diabetes mellitus, anasarca, hypertension, chronic pain syndrome, chronic noncompliance with medical regimen. Presents with chest pain, epigastric region radiating to arm.  Clinical Impression  Patient evaluated by Physical Therapy with no further acute PT needs identified. All education has been completed and the patient has no further questions.  See below for any follow-up Physical Therapy or equipment needs. PT is signing off. Thank you for this referral.     Follow Up Recommendations Outpatient PT    Equipment Recommendations  None recommended by PT    Recommendations for Other Services       Precautions / Restrictions Precautions Precautions: Other (comment) Precaution Comments: acute/ subacute DVT RUE      Mobility  Bed Mobility Overal bed mobility: Independent                Transfers Overall transfer level: Independent                  Ambulation/Gait Ambulation/Gait assistance: Supervision;Independent Ambulation Distance (Feet): 200 Feet Assistive device: None       General Gait Details: Overall walking well, with slight unsteadiness, but pt able to recover independently  Stairs Stairs:  (pt declined steps)          Wheelchair Mobility    Modified Rankin (Stroke Patients Only)       Balance Overall balance assessment: No apparent balance deficits (not formally assessed)                                           Pertinent Vitals/Pain Pain Assessment: No/denies pain    Home Living Family/patient expects to be discharged to:: Private residence Living Arrangements: Spouse/significant other Available Help at Discharge: Family Type of Home:  Apartment Home Access: Stairs to enter   Technical brewer of Steps: 2 Home Layout: One level Home Equipment: None      Prior Function Level of Independence: Independent               Hand Dominance        Extremity/Trunk Assessment   Upper Extremity Assessment:  (Pt receiving Outpt PT for R shoulder/neck)           Lower Extremity Assessment: Overall WFL for tasks assessed      Cervical / Trunk Assessment: Normal  Communication   Communication: No difficulties  Cognition Arousal/Alertness: Awake/alert Behavior During Therapy: WFL for tasks assessed/performed Overall Cognitive Status: Within Functional Limits for tasks assessed                      General Comments      Exercises        Assessment/Plan    PT Assessment All further PT needs can be met in the next venue of care  PT Diagnosis Generalized weakness   PT Problem List Pain  PT Treatment Interventions     PT Goals (Current goals can be found in the Care Plan section) Acute Rehab PT Goals Patient Stated Goal: hopes to get home PT Goal Formulation: All assessment and education complete, DC therapy    Frequency     Barriers to discharge  Co-evaluation               End of Session   Activity Tolerance: Patient tolerated treatment well Patient left: in bed;with call bell/phone within reach;with nursing/sitter in room Nurse Communication: Mobility status         Time: 1132-1140 PT Time Calculation (min) (ACUTE ONLY): 8 min   Charges:   PT Evaluation $Initial PT Evaluation Tier I: 1 Procedure     PT G CodesQuin Hoop 12/04/2014, 12:43 PM  Roney Marion, PT  Acute Rehabilitation Services Pager [phone] Office [phone]

## 2014-12-04 NOTE — Progress Notes (Signed)
Progress Note  Frank [name] LKT:[id number] DOB: 09/22/1965 DOA: 11/29/2014 PCP: Angelica Chessman, MD  Admit HPI / Brief Narrative: Frank [name] is a 49 y.o. BM PMHx ESRD on peritoneal dialysis with when necessary hemodialysis, diabetes mellitus, anasarca, hypertension, chronic pain syndrome, chronic noncompliance with medical regimen.  Appears the patient was seen in the ED for complaints of chest pain on 5/7, 5/8, and then 5/9-10 and admitted for chest on 5/10. The patient is presenting with complaints of chest pain. This started this afternoon. Pain is located in the epigastric region and progressively radiating to his left arm. He also has pain in his right arm and swelling of the right arm as well as right side of the face. He denies any trauma or injury. He mentions he is compliant with all his medications and take them regularly. He mentions he has taken his Saturday and Sunday dose of Coumadin. He mentions he does peritoneal dialysis regularly and he has done peritoneal dialysis and 11/28/2014. He mentions he was last hemodialyzed 2 weeks ago. He mentions he is in the process of switching to [facility] for continuation of his hemodialysis from [place].   HPI/Subjective: Complaining about nausea and vomiting overnight. As mentioned earlier chest x-ray showed ileus, did have bowel movement after suppository today. Was not able to walk around since admission. INR is 2.8 today, likely discharge later today or in a.m.  Assessment/Plan:  Accelerated hypertension -Presented with blood pressure of 200/120, admitted to stepdown initially. -Started a nitroglycerin drip, discontinued after initiation of oral medications. -BP is controlled now, likely this is volume issue and after initiation of proper dialysis blood pressure is not longer a problem. -Hydralazine and minoxidil discontinued by nephrology as blood pressure is better controlled than ever. -Blood pressure elevated, I  have increased lisinopril to 20 mg.  Acute on chronic combined CHF/dilated cardiomyopathy/pericardial effusion. -Combined CHF, LVEF of 35-40% with grade 2 diastolic dysfunction according to echo on 11/30/2014. -Most likely secondary to noncompliance to medical regimen secondary to accelerated hypertension. -Symptoms improved after dialysis reinstituted.  Pulmonary hypertension -See accelerated hypertension  ESRD -Getting peritoneal dialysis, he reported for the past 5 years. -From admission appears to be noncompliant with dialysis, patient reported that he had problems with the PD supplies. -Blood pressure and volume status improved after initiation of PT, per nephrology.  Anemia -Hemoglobin of 7.8, likely secondary to ESRD. -Asymptomatic, per nephrology for iron supplementation and Aranesp.  Chest pain -Troponins negative -Comparison of admission EKG to EKG from 11/17/2014 no changes from previous tracing.  Chronic anticoagulation for PE/acute/subacute DVT -Patient was on chronic anticoagulation for DVT happened last year. -Coumadin restarted to cover for acute/subacute right upper extremity DVT. -Coumadin is therapeutic today, discontinue Lovenox.  Noncompliance with medical treatment -Patient subtherapeutic INR for patient's poor compliance  Hyperkalemia. -Resolved, monitor closely -Potassium goal>4  Hypocalcemia -Chronic  Hypomagnesemia -Magnesium goal> 2 -1 gm magnesium  Chronic anemia. -Continue close monitoring after starting him on heparin.   Code Status: FULL Family Communication: no family present at time of exam Disposition Plan: Nephrology    Consultants: Dr. Roney Jaffe (nephrology)   Procedure/Significant Events: 3/13 echocardiogram;- Left ventricle: moderate LVH. LVEF= 30% to 35%. Diffuse hypokinesis.  - Left atrium: severely dilated.- Right ventricle:  moderately dilated. - Right atrium: mildly dilated. CVP=15 mm Hg. - Pulmonary arteries:  PA peak pressure: 40 mm Hg (S). - Pericardium, extracardiac: A small pericardial effusion with moderate collection at anterior base around right atrium. 5/11 echocardiogram;Left ventricle:  mildconcentric hypertrophy. -LVEF 35% to 40%.Diffuse hypokinesis.  -(grade 2 diastolicdysfunction). - Left atrium: severely dilated.- Right ventricle: severely dilated. - Right atrium: severely dilated. - Pulmonary arteries: PApeak pressure: 36 mm Hg (S). -Pericardium, extracardiac: A small to moderate, free-flowing pericardial effusion no evidence of hemodynamic compromise.  5/11 right upper extremity venous duplex; Indeterminate age DVT of the right internal jugular vein. - Mixed acute and chronic DVT of the right brachial vein.  -Subacute superficial thrombosis of the right basilic vein.   Culture NA  Antibiotics: NA  DVT prophylaxis: Heparin/SCD   Devices   LINES / TUBES:  PD cath    Continuous Infusions:    Objective: VITAL SIGNS: Temp: 98 F (36.7 C) (05/15 1000) Temp Source: Oral (05/15 1000) BP: 175/97 mmHg (05/15 1000) Pulse Rate: 71 (05/15 1000) SPO2; FIO2:   Intake/Output Summary (Last 24 hours) at 12/04/14 1346 Last data filed at 12/04/14 0935  Gross per 24 hour  Intake    720 ml  Output   1134 ml  Net   -414 ml     Exam: General: A/O 4, NAD, No acute respiratory distress Lungs: Clear to auscultation bilaterally without wheezes or crackles Cardiovascular: Regular rate and rhythm without murmur gallop or rub normal S1 and S2 Abdomen: Nontender, distended, soft, bowel sounds positive, no rebound, no ascites, no appreciable mass Extremities: No significant cyanosis, clubbing, or edema bilateral lower extremities    Data Reviewed: Basic Metabolic Panel:  Recent Labs Lab 11/29/14 1430 11/30/14 0910 12/01/14 0351 12/02/14 0438 12/03/14 0601 12/04/14 1114  NA  --  136 135 132* 132* 134*  K  --  4.5 4.8 4.0 3.7 4.1  CL  --  98* 97* 95* 95* 94*    CO2  --  21* _0 GLUCOSE  --  162* 127* 139* 115* 99  BUN  --  90* 86* 74* 61* 52*  CREATININE  --  20.27* 20.46* 19.06* 17.92* 17.39*  CALCIUM  --  7.4* 7.0* 6.8* 7.1* 7.7*  MG 1.9  --   --   --   --   --   PHOS  --  7.3* 7.0* 5.6* 5.9* 5.6*   Liver Function Tests:  Recent Labs Lab 11/30/14 0910 12/01/14 0351 12/02/14 0438 12/03/14 0601 12/04/14 1114  ALBUMIN 2.0* 1.9* 1.8* 1.8* 2.0*   No results for input(s): LIPASE, AMYLASE in the last 168 hours. No results for input(s): AMMONIA in the last 168 hours. CBC:  Recent Labs Lab 12/01/14 0350 12/01/14 0351 12/02/14 0458 12/03/14 0601 12/04/14 1114  WBC 6.2 6.2 5.2 5.5 6.6  HGB 7.8* 7.8* 7.8* 8.0* 9.7*  HCT 23.9* 23.6* 23.5* 24.5* 29.7*  MCV 82.1 81.9 82.2 82.8 83.0  PLT 223 220 223 241 273   Cardiac Enzymes: No results for input(s): CKTOTAL, CKMB, CKMBINDEX, TROPONINI in the last 168 hours. BNP (last 3 results)  Recent Labs  09/30/14 1556 11/17/14 0543 11/26/14 2352  BNP >4500.0* 2888.3* >4500.0*    ProBNP (last 3 results)  Recent Labs  01/02/14 0317 01/18/14 0048 04/15/14 0809  PROBNP >70000.0* >70000.0* >70000.0*    CBG: No results for input(s): GLUCAP in the last 168 hours.  Recent Results (from the past 240 hour(s))  MRSA PCR Screening     Status: None   Collection Time: 11/29/14  4:09 AM  Result Value Ref Range Status   MRSA by PCR NEGATIVE NEGATIVE Final    Comment:        The GeneXpert MRSA  Assay (FDA approved for NASAL specimens only), is one component of a comprehensive MRSA colonization surveillance program. It is not intended to diagnose MRSA infection nor to guide or monitor treatment for MRSA infections.      Studies:  Recent x-ray studies have been reviewed in detail by the Attending Physician  Scheduled Meds:  Scheduled Meds: . atorvastatin  40 mg Oral q1800  . bisacodyl  10 mg Rectal Once  . carvedilol  25 mg Oral BID WC  . cinacalcet  30 mg Oral Q  breakfast  . darbepoetin (ARANESP) injection - NON-DIALYSIS  150 mcg Subcutaneous Q Wed-1800  . dialysis solution 1.5% low-MG/low-CA   Intraperitoneal Q24H  . docusate sodium  100 mg Oral BID  . gentamicin cream  1 application Topical Daily  . [START ON 12/05/2014] lisinopril  20 mg Oral Daily  . pantoprazole  40 mg Oral Daily  . predniSONE  5 mg Oral Q breakfast  . sevelamer carbonate  1,600 mg Oral TID WC  . warfarin  2 mg Oral ONCE-1800  . Warfarin - Pharmacist Dosing Inpatient   Does not apply q1800    Time spent on care of this patient: 40 mins   [facility] A , MD  [facility] Office  [phone] Pager [phone]  On-Call/Text Page:      [email or web address]      password [email or web address]  If 7PM-7AM, please contact night-coverage [email or web address] Password [email or web address] 12/04/2014, 1:46 PM   LOS: 5 days

## 2014-12-04 NOTE — Progress Notes (Signed)
ANTICOAGULATION CONSULT NOTE - Follow Up Consult  Pharmacy Consult for warfarin + lovenox Indication: DVT  Allergies  Allergen Reactions  . Ferrlecit [Na Ferric Gluc Cplx In Sucrose] Shortness Of Breath, Swelling and Other (See Comments)    Swelling in throat  . Darvocet [Propoxyphene N-Acetaminophen] Hives    Patient Measurements: Height: _0  (185.4 cm) Weight: 176 lb 2.4 oz (79.9 kg) IBW/kg (Calculated) : 79.9  Vital Signs: Temp: 98 F (36.7 C) (05/15 1000) Temp Source: Oral (05/15 1000) BP: 175/97 mmHg (05/15 1000) Pulse Rate: 71 (05/15 1000)  Labs:  Recent Labs  12/02/14 0438  12/02/14 0458 12/02/14 1858 12/03/14 0601 12/04/14 1114 12/04/14 1202  HGB  --   < > 7.8*  --  8.0* 9.7*  --   HCT  --   --  23.5*  --  24.5* 29.7*  --   PLT  --   --  223  --  241 273  --   LABPROT  --   --  15.6*  --  20.7*  --  30.2*  INR  --   --  1.23  --  1.77*  --  2.86*  HEPARINUNFRC  --   --  0.17* 0.50 0.53  --   --   CREATININE 19.06*  --   --   --  17.92* 17.39*  --   < > = values in this interval not displayed.  Estimated Creatinine Clearance: 5.7 mL/min (by C-G formula based on Cr of 17.39).   Medications:  Scheduled:  . atorvastatin  40 mg Oral q1800  . bisacodyl  10 mg Rectal Once  . carvedilol  25 mg Oral BID WC  . cinacalcet  30 mg Oral Q breakfast  . darbepoetin (ARANESP) injection - NON-DIALYSIS  150 mcg Subcutaneous Q Wed-1800  . dialysis solution 1.5% low-MG/low-CA   Intraperitoneal Q24H  . docusate sodium  100 mg Oral BID  . gentamicin cream  1 application Topical Daily  . lisinopril  10 mg Oral Daily  . pantoprazole  40 mg Oral Daily  . predniSONE  5 mg Oral Q breakfast  . sevelamer carbonate  1,600 mg Oral TID WC  . Warfarin - Pharmacist Dosing Inpatient   Does not apply q1800    Assessment: 49 yo m admitted on 5/9 for SOB. Patient developed DVT with h/o of PE and noncompliance with outpatient warfarin 8 mg daily. NR 1.18 on admission.  INR is 2.89  this AM after two 10 mg doses and 5 mg last night.  Hesitant to hold doses, so will give a small dose tonight and hope his INR does not jump up too high tomorrow AM. Hgb 9.7, plts 273, no issues or bleeding.   Goal of Therapy:  INR 2-3 Monitor platelets by anticoagulation protocol: Yes   Plan:  Discontinue Lovenox Warfarin 2 mg x 1 tonight INR in AM to determine dosing Monitor hgb/ptls, s/s of bleeding, clinical course  Cassie L. Nicole Kindred, PharmD Clinical Pharmacy Resident Pager: [phone] 12/04/2014 1:26 PM

## 2014-12-04 NOTE — Progress Notes (Signed)
Patient changed to CAPD until we get another cycler on the unit. While doing manual drain patient's effluent appeared to be slightly pink tinged. No cloudiness or fibrin noted. Dr. Justin Mend notified. No new orders. Will continue to monitor.  Joellen Jersey, RN.

## 2014-12-05 ENCOUNTER — Ambulatory Visit: Payer: Medicare Other | Admitting: Physical Therapy

## 2014-12-05 DIAGNOSIS — R791 Abnormal coagulation profile: Secondary | ICD-10-CM

## 2014-12-05 DIAGNOSIS — I270 Primary pulmonary hypertension: Secondary | ICD-10-CM

## 2014-12-05 LAB — RENAL FUNCTION PANEL
Albumin: 1.8 g/dL — ABNORMAL LOW (ref 3.5–5.0)
Anion gap: 10 (ref 5–15)
BUN: 53 mg/dL — ABNORMAL HIGH (ref 6–20)
CHLORIDE: 94 mmol/L — AB (ref 101–111)
CO2: 27 mmol/L (ref 22–32)
Calcium: 7 mg/dL — ABNORMAL LOW (ref 8.9–10.3)
Creatinine, Ser: 17.9 mg/dL — ABNORMAL HIGH (ref 0.61–1.24)
GFR calc non Af Amer: 3 mL/min — ABNORMAL LOW (ref >60)
GFR, EST AFRICAN AMERICAN: 3 mL/min — AB (ref >60)
Glucose, Bld: 91 mg/dL (ref 65–99)
Phosphorus: 5.4 mg/dL — ABNORMAL HIGH (ref 2.5–4.6)
Potassium: 4.2 mmol/L (ref 3.5–5.1)
SODIUM: 131 mmol/L — AB (ref 135–145)

## 2014-12-05 LAB — PROTIME-INR
INR: 2.76 — ABNORMAL HIGH (ref 0.00–1.49)
PROTHROMBIN TIME: 29.4 s — AB (ref 11.6–15.2)

## 2014-12-05 MED ORDER — WARFARIN SODIUM 4 MG PO TABS: 4.0000 mg | ORAL_TABLET | Freq: Every day | ORAL | Status: DC

## 2014-12-05 MED ORDER — ONDANSETRON 4 MG PO TBDP: 4.0000 mg | ORAL_TABLET | Freq: Three times a day (TID) | ORAL | Status: DC | PRN

## 2014-12-05 MED ORDER — LISINOPRIL 20 MG PO TABS: 20.0000 mg | ORAL_TABLET | Freq: Every day | ORAL | Status: DC

## 2014-12-05 MED ORDER — RENA-VITE PO TABS: 1.0000 | ORAL_TABLET | Freq: Every day | ORAL | Status: DC

## 2014-12-05 NOTE — Progress Notes (Signed)
Subjective: Interval History: has no complaint to me . But wanted nurses to drain.  Explained purpose of longer dwells in CAPD .  Objective: Vital signs in last 24 hours: Temp:  [97.8 F (36.6 C)-99 F (37.2 C)] 99 F (37.2 C) (05/16 0445) Pulse Rate:  [70-76] 72 (05/16 0445) Resp:  [18] 18 (05/16 0445) BP: (149-175)/(78-97) 159/95 mmHg (05/16 0445) SpO2:  [96 %-98 %] 97 % (05/16 0445) Weight:  [77.248 kg (170 lb 4.8 oz)] 77.248 kg (170 lb 4.8 oz) (05/15 1825) Weight change: -2.652 kg (-5 lb 13.6 oz)  Intake/Output from previous day: 05/15 0701 - 05/16 0700 In: 4720 [P.O.:720] Out: 5534  Intake/Output this shift:    General appearance: alert, cooperative and edematous Resp: diminished breath sounds bilaterally Chest wall: RIJ cath Cardio: S1, S2 normal, S4 present and systolic murmur: holosystolic 2/6, blowing at apex GI: pos FW,. PD cath.  soft Extremities: edema  1+  Lab Results:  Recent Labs  12/03/14 0601 12/04/14 1114  WBC 5.5 6.6  HGB 8.0* 9.7*  HCT 24.5* 29.7*  PLT 241 273   BMET:  Recent Labs  12/04/14 1114 12/05/14 0412  NA 134* 131*  K 4.1 4.2  CL 94* 94*  CO2 26 27  GLUCOSE 99 91  BUN 52* 53*  CREATININE 17.39* 17.90*  CALCIUM 7.7* 7.0*   No results for input(s): PTH in the last 72 hours. Iron Studies: No results for input(s): IRON, TIBC, TRANSFERRIN, FERRITIN in the last 72 hours.  Studies/Results: No results found.  I have reviewed the patient's current medications.  Assessment/Plan: 1 ESRD poor PD candidate and needs to return to HD.  Will leave to primary Nephrologists in [place].  Not coming to [facility] 2 NONadherence  Primary issue for patient 3 Substance abuse 4 HTN meds lower vol 5 Anticoag therapeutic  6 anemia outpatient mgmt 7 HPTH meds P D/C to F/U with primary team at home   LOS: 6 days   Frank [name] L 12/05/2014,9:14 AM

## 2014-12-05 NOTE — Care Management Note (Signed)
Case Management Note  Patient Details  Name: Frank [name] MRN: [id number] Date of Birth: March 04, 1966  Subjective/Objective:                CM following for progression and d/c planning   Action/Plan: Met with pt on 12/02/14 and 12/05/14 pt will d/c to home, no HH needs, pt performs his own peritioneal dialysis.   Expected Discharge Date:        12/05/2014          Expected Discharge Plan:  Home/Self Care  In-House Referral:     Discharge planning Services  CM Consult  Post Acute Care Choice:    Choice offered to:     DME Arranged:    DME Agency:     HH Arranged:    HH Agency:     Status of Service:  Completed, signed off  Medicare Important Message Given:  Yes Date Medicare IM Given:  12/02/14 Medicare IM give by:  Jasmine Pang RN MPH [phone] Date Additional Medicare IM Given:  12/05/14 Additional Medicare Important Message give by:  Jasmine Pang RN MPH, case manager  If discussed at Long Length of Stay Meetings, dates discussed:    Additional Comments:  Adron Bene, RN 12/05/2014, 12:12 PM

## 2014-12-05 NOTE — Discharge Summary (Addendum)
Physician Discharge Summary  Frank [name] QMV:[id number] DOB: Feb 19, 1966 DOA: 11/29/2014  PCP: Angelica Chessman, MD  Admit date: 11/29/2014 Discharge date: 12/05/2014  Time spent: 40 minutes  Recommendations for Outpatient Follow-up:  1. Follow-up with primary care physician in primary nephrologist within 1 week.  Discharge Diagnoses:  Principal Problem:   Accelerated hypertension Active Problems:   Anemia of chronic kidney failure   Chronic anticoagulation   Subtherapeutic international normalized ratio (INR)   Acute on chronic combined systolic and diastolic CHF (congestive heart failure)   Diabetes type 2, uncontrolled   Chest pain   Anasarca   Dilated cardiomyopathy   Pulmonary hypertension   History of noncompliance with medical treatment   Hypocalcemia   Chronic pulmonary embolism   DVT (deep venous thrombosis)   Hypomagnesemia   Discharge Condition: Stable  Diet recommendation: Heart healthy  Filed Weights   12/02/14 2024 12/03/14 2031 12/04/14 1825  Weight: 80 kg (176 lb 5.9 oz) 79.9 kg (176 lb 2.4 oz) 77.248 kg (170 lb 4.8 oz)    History of present illness:  Frank [name] is a 49 y.o. male with Past medical history of ESRD on peritoneal dialysis with when necessary hemodialysis, diabetes mellitus, anasarca, hypertension, chronic pain syndrome, chronic noncompliance with medical regimen. The patient is presenting with complaints of chest pain. This started this afternoon. Pain is located in the epigastric region and progressively radiating to his left arm. He also has pain in his right arm and swelling of the right arm as well as right side of the face. He denies any trauma or injury. He mentions he is compliant with all his medications and take them regularly. He mentions he has taken his Saturday and Sunday dose of Coumadin. He mentions he does peritoneal dialysis regularly and he has done peritoneal dialysis and 11/28/2014. He mentions he was last hemodialyzed  2 weeks ago. He mentions he is in the process of switching to [facility] for continuation of his hemodialysis from [place].  The patient is coming from home. And at his baseline independent for most of his ADL.   Hospital Course:   Accelerated hypertension -Presented with blood pressure of 200/120, admitted to stepdown initially. -Started a nitroglycerin drip, discontinued after initiation of oral medications. -BP is controlled now, likely this is volume issue and after initiation of proper dialysis blood pressure is not longer a problem. -Hydralazine and minoxidil discontinued by nephrology as blood pressure is better controlled than ever. -Blood pressure increased, I have increase lisinopril to 20 mg daily.  Acute on chronic combined CHF/dilated cardiomyopathy/pericardial effusion. -Combined CHF, LVEF of 35-40% with grade 2 diastolic dysfunction according to echo on 11/30/2014. -Most likely secondary to noncompliance to medical regimen secondary to accelerated hypertension. -Symptoms improved after dialysis reinstituted. -I discussed with him over 25 minutes the importance of his dialysis to decrease the volume which helped his heart and his blood pressure.  Pulmonary hypertension -See accelerated hypertension  ESRD -Getting peritoneal dialysis, he reported for the past 5 years. -From admission appears to be noncompliant with dialysis, patient reported that he had problems with the PD supplies. -Blood pressure and volume status improved after initiation of PT, per nephrology.  Anemia -Hemoglobin of 7.8, likely secondary to ESRD. -Asymptomatic, per nephrology for iron supplementation and Aranesp. -Hemoglobin improved to 9.7 on discharge, likely to hemoconcentration from taking fluids and the effect of Aranesp.  Chest pain -Troponins negative -Comparison of admission EKG to EKG from 11/17/2014 no changes from previous tracing.  Acute/subacute  DVT, history of DVT -Patient  was on chronic anticoagulation for DVT happened last year. -Coumadin restarted to cover for acute/subacute right upper extremity DVT. -Coumadin is therapeutic today, discontinue Lovenox.  Noncompliance with medical treatment -Patient subtherapeutic INR for patient's poor compliance  Hyperkalemia. -Resolved, monitor closely -Potassium goal>4  Hypocalcemia -Chronic  Hypomagnesemianext -Magnesium goal> 2 -1 gm magnesium  Medical nonadherence -Patient had problems with adherence to medical treatments including peritoneal dialysis. -Has had fifth 15 visit to the ED in the past 6 months, he also has multiple hospital visits at [facility]. -I spent a lot of time explaining to him the importance of adherence to his peritoneal dialysis and taken his blood pressure medications. -Patient voiced understanding, blamed lack of supplies for dialysis, he think he will have access to supplies and now.  Pink fluid return from dialysis catheter Per nurse's documentation the return of the dialysis fluids were pink yesterday afternoon. Discussed with Dr. Jimmy Footman of nephrology and he recommended close follow-up. Per nephrology this is not a common after restarting peritoneal dialysis especially if someone is on blood thinners.   Procedures:  Peritoneal dialysis  Consultations:  Nephrology  Discharge Exam: Filed Vitals:   12/05/14 0445  BP: 159/95  Pulse: 72  Temp: 99 F (37.2 C)  Resp: 18   General: Alert and awake, oriented x3, not in any acute distress. HEENT: anicteric sclera, pupils reactive to light and accommodation, EOMI CVS: S1-S2 clear, no murmur rubs or gallops Chest: clear to auscultation bilaterally, no wheezing, rales or rhonchi Abdomen: soft nontender, nondistended, normal bowel sounds, no organomegaly Extremities: no cyanosis, clubbing or edema noted bilaterally Neuro: Cranial nerves II-XII intact, no focal neurological deficits  Discharge  Instructions   Discharge Instructions    Diet - low sodium heart healthy    Complete by:  As directed      Increase activity slowly    Complete by:  As directed           Current Discharge Medication List    START taking these medications   Details  ondansetron (ZOFRAN-ODT) 4 MG disintegrating tablet Take 1 tablet (4 mg total) by mouth every 8 (eight) hours as needed for nausea or vomiting. Qty: 20 tablet, Refills: 0      CONTINUE these medications which have CHANGED   Details  lisinopril (PRINIVIL,ZESTRIL) 20 MG tablet Take 1 tablet (20 mg total) by mouth daily. Qty: 30 tablet, Refills: 0    warfarin (COUMADIN) 4 MG tablet Take 1 tablet (4 mg total) by mouth daily at 6 PM. Qty: 30 tablet, Refills: 0      CONTINUE these medications which have NOT CHANGED   Details  atorvastatin (LIPITOR) 40 MG tablet Take 1 tablet (40 mg total) by mouth daily at 6 PM. Qty: 30 tablet, Refills: 1    carvedilol (COREG) 25 MG tablet Take 1 tablet (25 mg total) by mouth 2 (two) times daily with a meal.    cinacalcet (SENSIPAR) 30 MG tablet Take 30 mg by mouth daily.    diclofenac sodium (VOLTAREN) 1 % GEL Apply 2 g topically 4 (four) times daily. Qty: 2 Tube, Refills: 2    docusate sodium (COLACE) 100 MG capsule Take 1 capsule (100 mg total) by mouth 2 (two) times daily. Qty: 60 capsule, Refills: 0    doxercalciferol (HECTOROL) 4 MCG/2ML injection Inject 1.25 mLs (2.5 mcg total) into the vein Every Tuesday,Thursday,and Saturday with dialysis. Qty: 2 mL    levalbuterol (XOPENEX HFA) 45  MCG/ACT inhaler Inhale 2 puffs into the lungs every 6 (six) hours as needed for wheezing or shortness of breath. Qty: 1 Inhaler, Refills: 2    LORazepam (ATIVAN) 0.5 MG tablet Take 1 tablet (0.5 mg total) by mouth at bedtime as needed for anxiety. Qty: 15 tablet, Refills: 0   Associated Diagnoses: Sleep disturbances    methocarbamol (ROBAXIN) 500 MG tablet Take 1 tablet (500 mg total) by mouth every 8  (eight) hours as needed for muscle spasms. Qty: 30 tablet, Refills: 0    omeprazole (PRILOSEC) 20 MG capsule Take 20 mg by mouth daily.    predniSONE (DELTASONE) 5 MG tablet Take 5 mg by mouth daily with breakfast.    sevelamer carbonate (RENVELA) 800 MG tablet Take 1,600 mg by mouth 3 (three) times daily with meals.    oxyCODONE (ROXICODONE) 5 MG immediate release tablet Take 1 tablet (5 mg total) by mouth every 6 (six) hours as needed for severe pain. Qty: 30 tablet, Refills: 0      STOP taking these medications     hydrALAZINE (APRESOLINE) 25 MG tablet      minoxidil (LONITEN) 2.5 MG tablet        Allergies  Allergen Reactions  . Ferrlecit [Na Ferric Gluc Cplx In Sucrose] Shortness Of Breath, Swelling and Other (See Comments)    Swelling in throat  . Darvocet [Propoxyphene N-Acetaminophen] Hives   Follow-up Information    Follow up with JEGEDE, OLUGBEMIGA, MD In 1 week.   Specialty:  Internal Medicine   Contact information:   [place] [address] [phone]        The results of significant diagnostics from this hospitalization (including imaging, microbiology, ancillary and laboratory) are listed below for reference.    Significant Diagnostic Studies: Dg Chest 2 View  11/28/2014   CLINICAL DATA:  Chest abdomen and bilateral shoulder pain, onset tonight  EXAM: CHEST  2 VIEW  COMPARISON:  11/27/2014  FINDINGS: There is a right jugular central line with tip in the right atrium. There is marked unchanged cardiomegaly. Lungs are clear. There are no effusions. The pulmonary vasculature is normal.  IMPRESSION: Cardiomegaly.  No acute cardiopulmonary findings.   Electronically Signed   By: Andreas Newport M.D.   On: 11/28/2014 21:37   Dg Chest 2 View  11/27/2014   CLINICAL DATA:  Dyspnea and bilateral upper extremity swelling  EXAM: CHEST  2 VIEW  COMPARISON:  11/01/2014  FINDINGS: There is a right jugular central line with tip in the right atrium. There  is moderate cardiomegaly, unchanged. The lungs are clear. There are no pleural effusions. Pulmonary vasculature is normal.  IMPRESSION: Cardiomegaly.  No acute findings.   Electronically Signed   By: Andreas Newport M.D.   On: 11/27/2014 01:54   Dg Abd 1 View  12/02/2014   CLINICAL DATA:  Vomiting for 4 days.  EXAM: ABDOMEN - 1 VIEW  COMPARISON:  CT scan of November 05, 2014. Radiographs of May 12, 2014.  FINDINGS: Peritoneal dialysis catheter is noted in the pelvis. Mildly dilated small bowel loops are noted in the lower abdomen. No colonic dilatation is noted. No radio-opaque calculi or other significant radiographic abnormality are seen.  IMPRESSION: Mildly dilated small bowel loops are noted in the lower abdomen which may represent focal ileus. Followup radiographs are recommended to rule out obstruction.   Electronically Signed   By: Marijo Conception, M.D.   On: 12/02/2014 20:42   Dg Shoulder Left  11/17/2014   CLINICAL DATA:  Chronic left shoulder pain.  Initial encounter.  EXAM: LEFT SHOULDER - 2+ VIEW  COMPARISON:  Left shoulder radiographs performed 11/28/2012  FINDINGS: There appears to be a mildly displaced osseous Bankart lesion, which may reflect interval dislocation, new from the prior study.  There is no evidence of dislocation at this time. The left humeral head is seated within the glenoid fossa. The acromioclavicular joint is unremarkable in appearance. No significant soft tissue abnormalities are seen. The visualized portions of the left lung are clear.  IMPRESSION: Apparent mildly displaced osseous Bankart lesion, which may reflect dislocation since 2014. No additional evidence for fracture. MRI could be considered to assess the extent of underlying labral injury.   Electronically Signed   By: Garald Balding M.D.   On: 11/17/2014 06:03    Microbiology: Recent Results (from the past 240 hour(s))  MRSA PCR Screening     Status: None   Collection Time: 11/29/14  4:09 AM  Result Value  Ref Range Status   MRSA by PCR NEGATIVE NEGATIVE Final    Comment:        The GeneXpert MRSA Assay (FDA approved for NASAL specimens only), is one component of a comprehensive MRSA colonization surveillance program. It is not intended to diagnose MRSA infection nor to guide or monitor treatment for MRSA infections.      Labs: Basic Metabolic Panel:  Recent Labs Lab 11/29/14 1430  12/01/14 0351 12/02/14 0438 12/03/14 0601 12/04/14 1114 12/05/14 0412  NA  --   < > 135 132* 132* 134* 131*  K  --   < > 4.8 4.0 3.7 4.1 4.2  CL  --   < > 97* 95* 95* 94* 94*  CO2  --   < > _0 GLUCOSE  --   < > 127* 139* 115* 99 91  BUN  --   < > 86* 74* 61* 52* 53*  CREATININE  --   < > 20.46* 19.06* 17.92* 17.39* 17.90*  CALCIUM  --   < > 7.0* 6.8* 7.1* 7.7* 7.0*  MG 1.9  --   --   --   --   --   --   PHOS  --   < > 7.0* 5.6* 5.9* 5.6* 5.4*  < > = values in this interval not displayed. Liver Function Tests:  Recent Labs Lab 12/01/14 0351 12/02/14 0438 12/03/14 0601 12/04/14 1114 12/05/14 0412  ALBUMIN 1.9* 1.8* 1.8* 2.0* 1.8*   No results for input(s): LIPASE, AMYLASE in the last 168 hours. No results for input(s): AMMONIA in the last 168 hours. CBC:  Recent Labs Lab 12/01/14 0350 12/01/14 0351 12/02/14 0458 12/03/14 0601 12/04/14 1114  WBC 6.2 6.2 5.2 5.5 6.6  HGB 7.8* 7.8* 7.8* 8.0* 9.7*  HCT 23.9* 23.6* 23.5* 24.5* 29.7*  MCV 82.1 81.9 82.2 82.8 83.0  PLT 223 220 223 241 273   Cardiac Enzymes: No results for input(s): CKTOTAL, CKMB, CKMBINDEX, TROPONINI in the last 168 hours. BNP: BNP (last 3 results)  Recent Labs  09/30/14 1556 11/17/14 0543 11/26/14 2352  BNP >4500.0* 2888.3* >4500.0*    ProBNP (last 3 results)  Recent Labs  01/02/14 0317 01/18/14 0048 04/15/14 0809  PROBNP >70000.0* >70000.0* >70000.0*    CBG: No results for input(s): GLUCAP in the last 168 hours.     Signed:  Breona Cherubin A  [facility] 12/05/2014,  12:03 PM

## 2014-12-06 ENCOUNTER — Ambulatory Visit: Payer: Medicare Other | Admitting: Physical Therapy

## 2014-12-12 ENCOUNTER — Encounter (HOSPITAL_COMMUNITY): Payer: Self-pay | Admitting: Family Medicine

## 2014-12-12 ENCOUNTER — Emergency Department (HOSPITAL_COMMUNITY): Payer: Medicare Other

## 2014-12-12 ENCOUNTER — Inpatient Hospital Stay (HOSPITAL_COMMUNITY)
Admission: EM | Admit: 2014-12-12 | Discharge: 2014-12-12 | DRG: 682 | Disposition: A | Discharge status: Home / Self Care | Payer: Medicare Other | Attending: Nephrology | Admitting: Nephrology

## 2014-12-12 DIAGNOSIS — E119 Type 2 diabetes mellitus without complications: Secondary | ICD-10-CM | POA: Diagnosis present

## 2014-12-12 DIAGNOSIS — I120 Hypertensive chronic kidney disease with stage 5 chronic kidney disease or end stage renal disease: Secondary | ICD-10-CM | POA: Diagnosis present

## 2014-12-12 DIAGNOSIS — R0602 Shortness of breath: Secondary | ICD-10-CM | POA: Diagnosis present

## 2014-12-12 DIAGNOSIS — F329 Major depressive disorder, single episode, unspecified: Secondary | ICD-10-CM | POA: Diagnosis present

## 2014-12-12 DIAGNOSIS — F419 Anxiety disorder, unspecified: Secondary | ICD-10-CM | POA: Diagnosis present

## 2014-12-12 DIAGNOSIS — I509 Heart failure, unspecified: Secondary | ICD-10-CM | POA: Diagnosis present

## 2014-12-12 DIAGNOSIS — N186 End stage renal disease: Secondary | ICD-10-CM | POA: Diagnosis present

## 2014-12-12 DIAGNOSIS — Z79899 Other long term (current) drug therapy: Secondary | ICD-10-CM

## 2014-12-12 DIAGNOSIS — E875 Hyperkalemia: Secondary | ICD-10-CM

## 2014-12-12 DIAGNOSIS — Z87891 Personal history of nicotine dependence: Secondary | ICD-10-CM

## 2014-12-12 DIAGNOSIS — Z940 Kidney transplant status: Secondary | ICD-10-CM | POA: Diagnosis not present

## 2014-12-12 DIAGNOSIS — E8779 Other fluid overload: Secondary | ICD-10-CM

## 2014-12-12 DIAGNOSIS — Z7901 Long term (current) use of anticoagulants: Secondary | ICD-10-CM | POA: Diagnosis not present

## 2014-12-12 LAB — COMPREHENSIVE METABOLIC PANEL
ALT: 11 U/L — ABNORMAL LOW (ref 17–63)
ANION GAP: 18 — AB (ref 5–15)
AST: 44 U/L — ABNORMAL HIGH (ref 15–41)
Albumin: 2.6 g/dL — ABNORMAL LOW (ref 3.5–5.0)
Alkaline Phosphatase: 83 U/L (ref 38–126)
BUN: 109 mg/dL — ABNORMAL HIGH (ref 6–20)
CO2: 21 mmol/L — ABNORMAL LOW (ref 22–32)
Calcium: 7.4 mg/dL — ABNORMAL LOW (ref 8.9–10.3)
Chloride: 99 mmol/L — ABNORMAL LOW (ref 101–111)
Creatinine, Ser: 27.27 mg/dL — ABNORMAL HIGH (ref 0.61–1.24)
GFR calc Af Amer: 2 mL/min — ABNORMAL LOW (ref >60)
GFR, EST NON AFRICAN AMERICAN: 2 mL/min — AB (ref >60)
GLUCOSE: 131 mg/dL — AB (ref 65–99)
Potassium: 6.9 mmol/L (ref 3.5–5.1)
Sodium: 138 mmol/L (ref 135–145)
Total Bilirubin: 0.5 mg/dL (ref 0.3–1.2)
Total Protein: 7.1 g/dL (ref 6.5–8.1)

## 2014-12-12 LAB — CBC WITH DIFFERENTIAL/PLATELET
Basophils Absolute: 0 10*3/uL (ref 0.0–0.1)
Basophils Relative: 0 % (ref 0–1)
Eosinophils Absolute: 0 10*3/uL (ref 0.0–0.7)
Eosinophils Relative: 0 % (ref 0–5)
HCT: 29.1 % — ABNORMAL LOW (ref 39.0–52.0)
HEMOGLOBIN: 9.3 g/dL — AB (ref 13.0–17.0)
LYMPHS ABS: 1 10*3/uL (ref 0.7–4.0)
LYMPHS PCT: 15 % (ref 12–46)
MCH: 27 pg (ref 26.0–34.0)
MCHC: 32 g/dL (ref 30.0–36.0)
MCV: 84.3 fL (ref 78.0–100.0)
MONOS PCT: 8 % (ref 3–12)
Monocytes Absolute: 0.5 10*3/uL (ref 0.1–1.0)
NEUTROS PCT: 77 % (ref 43–77)
Neutro Abs: 5.4 10*3/uL (ref 1.7–7.7)
Platelets: 239 10*3/uL (ref 150–400)
RBC: 3.45 MIL/uL — ABNORMAL LOW (ref 4.22–5.81)
RDW: 15.3 % (ref 11.5–15.5)
WBC: 7 10*3/uL (ref 4.0–10.5)

## 2014-12-12 LAB — POTASSIUM: POTASSIUM: 5.2 mmol/L — AB (ref 3.5–5.1)

## 2014-12-12 LAB — HEPATITIS B SURFACE ANTIGEN: Hepatitis B Surface Ag: NEGATIVE

## 2014-12-12 LAB — I-STAT TROPONIN, ED: Troponin i, poc: 0.07 ng/mL (ref 0.00–0.08)

## 2014-12-12 MED ORDER — LIDOCAINE HCL (PF) 1 % IJ SOLN: 5.0000 mL | INTRAMUSCULAR | Status: DC | PRN

## 2014-12-12 MED ORDER — ONDANSETRON HCL 4 MG/2ML IJ SOLN
4.0000 mg | Freq: Once | INTRAMUSCULAR | Status: AC
  Administered 2014-12-12: 4 mg via INTRAVENOUS
  Filled 2014-12-12: qty 2

## 2014-12-12 MED ORDER — NEPRO/CARBSTEADY PO LIQD: 237.0000 mL | ORAL | Status: DC | PRN

## 2014-12-12 MED ORDER — ALBUTEROL SULFATE (2.5 MG/3ML) 0.083% IN NEBU: 5.0000 mg | INHALATION_SOLUTION | Freq: Once | RESPIRATORY_TRACT | Status: DC

## 2014-12-12 MED ORDER — INSULIN ASPART 100 UNIT/ML ~~LOC~~ SOLN
10.0000 [IU] | Freq: Once | SUBCUTANEOUS | Status: DC
Start: 2014-12-12 — End: 2014-12-12
  Filled 2014-12-12: qty 1

## 2014-12-12 MED ORDER — HEPARIN SODIUM (PORCINE) 1000 UNIT/ML DIALYSIS: 1000.0000 [IU] | INTRAMUSCULAR | Status: DC | PRN

## 2014-12-12 MED ORDER — SODIUM CHLORIDE 0.9 % IV SOLN: 100.0000 mL | INTRAVENOUS | Status: DC | PRN

## 2014-12-12 MED ORDER — PENTAFLUOROPROP-TETRAFLUOROETH EX AERO: 1.0000 "application " | INHALATION_SPRAY | CUTANEOUS | Status: DC | PRN

## 2014-12-12 MED ORDER — ALTEPLASE 2 MG IJ SOLR
2.0000 mg | Freq: Once | INTRAMUSCULAR | Status: DC | PRN
Start: 2014-12-12 — End: 2014-12-12

## 2014-12-12 MED ORDER — SODIUM CHLORIDE 0.9 % IV SOLN
1.0000 g | Freq: Once | INTRAVENOUS | Status: DC
  Filled 2014-12-12: qty 10

## 2014-12-12 MED ORDER — DEXTROSE 50 % IV SOLN: 1.0000 | Freq: Once | INTRAVENOUS | Status: DC

## 2014-12-12 MED ORDER — LIDOCAINE-PRILOCAINE 2.5-2.5 % EX CREA: 1.0000 "application " | TOPICAL_CREAM | CUTANEOUS | Status: DC | PRN

## 2014-12-12 NOTE — ED Notes (Signed)
Pt sts he is here for dialysis treatment. sts last treatment 4 days ago. sts some vomiting and feeling nervous and weak.

## 2014-12-12 NOTE — ED Notes (Signed)
Pt refuses to allow RN to obtain standing weight. RN educated pt on the importance of an accurate prior to dialysis and with his CHF history. Pt verbalized understanding and stated " I don't care".

## 2014-12-12 NOTE — Progress Notes (Signed)
Pt discharged home per Dr. Edrick Oh; pt alert and orientedx4, denies pain, SOB, dizziness or n/v. Pt walked off the unit to go home

## 2014-12-12 NOTE — ED Provider Notes (Addendum)
CSN: [id number]     Arrival date & time 12/12/14  1144 History   First MD Initiated Contact with Patient 12/12/14 1216     Chief Complaint  Patient presents with  . Vascular Access Problem     (Consider location/radiation/quality/duration/timing/severity/associated sxs/prior Treatment) HPI Comments: Patient states that he has not had peritoneal dialysis for the last 4 days because he does not have any further bags of diastolit at home. Also he has not had hemodialysis for the last 3 weeks. Patient is in the transition of going from hemodialysis to peritoneal dialysis.  He was receiving his care [facility] but he has been leaning [place] for the last 9 months. Now his vehicle is broken and he is unable to make the trip to [place]. He does not have a nephrologist for dialysis center here in [place]. He has noticed worsening edema, close to a 20 pound weight gain, mild shortness of breath with exertion and intermittent nausea and vomiting for the last few days. He denies fever, chest pain, abdominal pain.  The history is provided by the patient.    Past Medical History  Diagnosis Date  . Hypertension   . Depression   . Complication of anesthesia     itching, sore throat  . Diabetes mellitus without complication     No history per patient, but remains under history as A1c would not be accurate given on dialysis  . Shortness of breath   . Anxiety   . ESRD (end stage renal disease)     due to HTN per patient, followed at [facility], s/p failed kidney transplant - dialysis Tue, Th, Sat  . Renal insufficiency   . CHF (congestive heart failure)   . Anemia    Past Surgical History  Procedure Laterality Date  . Kidney receipient  2006    failed and started HD in March 2014  . Capd insertion    . Capd removal    . Left heart catheterization with coronary angiogram N/A 09/02/2014    Procedure: LEFT HEART CATHETERIZATION WITH CORONARY ANGIOGRAM;  Surgeon: [name], MD;  Location:  [facility] CATH LAB;  Service: Cardiovascular;  Laterality: N/A;   History reviewed. No pertinent family history. History  Substance Use Topics  . Smoking status: Former Smoker -- 1.00 packs/day for 1 years    Types: Cigarettes  . Smokeless tobacco: Never Used     Comment: quit Jan 2014  . Alcohol Use: No    Review of Systems  All other systems reviewed and are negative.     Allergies  Ferrlecit and Darvocet  Home Medications   Prior to Admission medications   Medication Sig Start Date End Date Taking? Authorizing Provider  atorvastatin (LIPITOR) 40 MG tablet Take 1 tablet (40 mg total) by mouth daily at 6 PM. 09/02/14   Barton Dubois, MD  carvedilol (COREG) 25 MG tablet Take 1 tablet (25 mg total) by mouth 2 (two) times daily with a meal. 10/10/14   [name], DO  cinacalcet (SENSIPAR) 30 MG tablet Take 30 mg by mouth daily.    Historical Provider, MD  diclofenac sodium (VOLTAREN) 1 % GEL Apply 2 g topically 4 (four) times daily. 11/14/14   Tresa Garter, MD  docusate sodium (COLACE) 100 MG capsule Take 1 capsule (100 mg total) by mouth 2 (two) times daily. 11/19/14   [name], MD  doxercalciferol (HECTOROL) 4 MCG/2ML injection Inject 1.25 mLs (2.5 mcg total) into the vein Every Tuesday,Thursday,and Saturday with  dialysis. 10/10/14   [name], DO  levalbuterol (XOPENEX HFA) 45 MCG/ACT inhaler Inhale 2 puffs into the lungs every 6 (six) hours as needed for wheezing or shortness of breath. 11/19/14   [name], MD  lisinopril (PRINIVIL,ZESTRIL) 20 MG tablet Take 1 tablet (20 mg total) by mouth daily. 12/05/14   Verlee Monte, MD  LORazepam (ATIVAN) 0.5 MG tablet Take 1 tablet (0.5 mg total) by mouth at bedtime as needed for anxiety. 11/19/14   [name], MD  methocarbamol (ROBAXIN) 500 MG tablet Take 1 tablet (500 mg total) by mouth every 8 (eight) hours as needed for muscle spasms. 11/19/14   [name], MD  omeprazole (PRILOSEC) 20 MG capsule Take 20 mg by mouth  daily. 07/01/13   Historical Provider, MD  ondansetron (ZOFRAN-ODT) 4 MG disintegrating tablet Take 1 tablet (4 mg total) by mouth every 8 (eight) hours as needed for nausea or vomiting. 12/05/14   Verlee Monte, MD  oxyCODONE (ROXICODONE) 5 MG immediate release tablet Take 1 tablet (5 mg total) by mouth every 6 (six) hours as needed for severe pain. Patient not taking: Reported on 11/28/2014 11/19/14   [name], MD  predniSONE (DELTASONE) 5 MG tablet Take 5 mg by mouth daily with breakfast.    Historical Provider, MD  sevelamer carbonate (RENVELA) 800 MG tablet Take 1,600 mg by mouth 3 (three) times daily with meals. 12/27/13 12/27/14  Historical Provider, MD  warfarin (COUMADIN) 4 MG tablet Take 1 tablet (4 mg total) by mouth daily at 6 PM. 12/05/14   Mutaz Elmahi, MD   BP 169/95 mmHg  Pulse 63  Temp(Src) 98.7 F (37.1 C) (Oral)  Resp 18  SpO2 98% Physical Exam  Constitutional: He is oriented to person, place, and time. He appears well-developed and well-nourished. No distress.  HENT:  Head: Normocephalic and atraumatic.  Mouth/Throat: Oropharynx is clear and moist.  Eyes: Conjunctivae and EOM are normal. Pupils are equal, round, and reactive to light.  Neck: Normal range of motion. Neck supple.  Cardiovascular: Normal rate, regular rhythm and intact distal pulses.   No murmur heard. Pulmonary/Chest: Effort normal and breath sounds normal. No respiratory distress. He has no wheezes. He has no rales.  Dialysis catheter present in the right upper chest wall  Abdominal: Soft. He exhibits distension. There is no tenderness. There is no rebound and no guarding.  PD catheter present in the right abdomen without any erythema or drainage.  Musculoskeletal: Normal range of motion. He exhibits edema. He exhibits no tenderness.  3+ pitting edema in bilateral lower extremities to the mid shin  Neurological: He is alert and oriented to person, place, and time.  Skin: Skin is warm and dry. No rash  noted. No erythema.  Psychiatric: He has a normal mood and affect. His behavior is normal.  Nursing note and vitals reviewed.   ED Course  Procedures (including critical care time) Labs Review Labs Reviewed  CBC WITH DIFFERENTIAL/PLATELET - Abnormal; Notable for the following:    RBC 3.45 (*)    Hemoglobin 9.3 (*)    HCT 29.1 (*)    All other components within normal limits  COMPREHENSIVE METABOLIC PANEL - Abnormal; Notable for the following:    Potassium 6.9 (*)    Chloride 99 (*)    CO2 21 (*)    Glucose, Bld 131 (*)    BUN 109 (*)    Creatinine, Ser 27.27 (*)    Calcium 7.4 (*)  Albumin 2.6 (*)    AST 44 (*)    ALT 11 (*)    GFR calc non Af Amer 2 (*)    GFR calc Af Amer 2 (*)    Anion gap 18 (*)    All other components within normal limits  I-STAT TROPOININ, ED    Imaging Review No results found.   EKG Interpretation   Date/Time:  Monday Dec 12 2014 12:43:19 EDT Ventricular Rate:  64 PR Interval:  182 QRS Duration: 93 QT Interval:  490 QTC Calculation: 506 R Axis:   -7 Text Interpretation:  Sinus rhythm Borderline low voltage, extremity leads  Abnormal T, consider ischemia, lateral leads Prolonged QT interval No  significant change since last tracing Confirmed by Maryan Rued  MD, Loree Fee  [phone]) on 12/12/2014 12:48:01 PM      MDM   Final diagnoses:  SOB (shortness of breath)  Hyperkalemia  Other hypervolemia  ESRD (end stage renal disease)   patient with a history of end-stage renal disease who is currently on peritoneal dialysis and hemodialysis. Patient had been on hemodialysis for 2 years but was getting transitioned to peritoneal dialysis because he worked and was able to do it at home. He is in the last 9 months has been driving to [facility] to receive his care however he is been living in [place]. He states now his vehicle was unable to get him to [facility] and he does not have a place to dialyze. He also does not have any peritoneal  dialysate with and his last peritoneal dialysis was 4 days ago and last hemodialysis was 3 weeks ago. He is complaining of fluid overload, shortness of breath, 20 pound weight gain of fluid, leg swelling and nausea and vomiting intermittently. He denies any chest pain, fever, abdominal pain.  Patient has signs of fluid overload on exam with lower extremity edema, abdominal distention without any tenderness concerning for SBP. The lung sounds are clear.  CBC, CMP, troponin, chest x-ray, EKG pending. Discussed with nephrology as patient will most likely require dialysis today. Also will discuss with case management about getting patient set up with a new dialysis unit.  1:09 PM Labs consistent with hyperkalemia of 6.9 without significant EKG changes, BUN of 109 and creatinine is still pending. Patient given calcium gluconate, insulin, albuterol and D50. We'll discuss with nephrology if they went to give patient Kayexalate.  Patient will need dialysis.  1:46 PM Spoke with Dr. Justin Mend who will dialyze the patient. Social work also coming to see the patient to set up outpatient services  CRITICAL CARE Performed by: Blanchie Dessert Total critical care time: 30 Critical care time was exclusive of separately billable procedures and treating other patients. Critical care was necessary to treat or prevent imminent or life-threatening deterioration. Critical care was time spent personally by me on the following activities: development of treatment plan with patient and/or surrogate as well as nursing, discussions with consultants, evaluation of patient's response to treatment, examination of patient, obtaining history from patient or surrogate, ordering and performing treatments and interventions, ordering and review of laboratory studies, ordering and review of radiographic studies, pulse oximetry and re-evaluation of patient's condition.   Blanchie Dessert, MD 12/12/14 1322  Blanchie Dessert,  MD 12/12/14 1347

## 2014-12-12 NOTE — Discharge Planning (Signed)
NCM consulted to assist with finding HD center for pt.  NCM called Bethena Roys to find out process.  Bethena Roys informed that CLIP process can sometimes take several days.  NCM relayed information to EDP who in turn consulted for admission to hospital where [place] staff will CLIP pt prior to discharge.

## 2014-12-12 NOTE — ED Notes (Signed)
Patient refuses to allow me to shave his chest to complete EKG.

## 2014-12-14 ENCOUNTER — Encounter (HOSPITAL_COMMUNITY): Payer: Self-pay | Admitting: Emergency Medicine

## 2014-12-14 ENCOUNTER — Ambulatory Visit: Payer: Medicare Other | Admitting: Physical Therapy

## 2014-12-14 ENCOUNTER — Emergency Department (HOSPITAL_COMMUNITY)
Admission: EM | Admit: 2014-12-14 | Discharge: 2014-12-14 | Disposition: A | Discharge status: Home / Self Care | Payer: Medicare Other | Attending: Emergency Medicine | Admitting: Emergency Medicine

## 2014-12-14 DIAGNOSIS — E119 Type 2 diabetes mellitus without complications: Secondary | ICD-10-CM | POA: Insufficient documentation

## 2014-12-14 DIAGNOSIS — Z992 Dependence on renal dialysis: Secondary | ICD-10-CM | POA: Diagnosis not present

## 2014-12-14 DIAGNOSIS — I509 Heart failure, unspecified: Secondary | ICD-10-CM | POA: Diagnosis not present

## 2014-12-14 DIAGNOSIS — Z791 Long term (current) use of non-steroidal anti-inflammatories (NSAID): Secondary | ICD-10-CM | POA: Diagnosis not present

## 2014-12-14 DIAGNOSIS — Z79899 Other long term (current) drug therapy: Secondary | ICD-10-CM | POA: Diagnosis not present

## 2014-12-14 DIAGNOSIS — I120 Hypertensive chronic kidney disease with stage 5 chronic kidney disease or end stage renal disease: Secondary | ICD-10-CM | POA: Insufficient documentation

## 2014-12-14 DIAGNOSIS — Z862 Personal history of diseases of the blood and blood-forming organs and certain disorders involving the immune mechanism: Secondary | ICD-10-CM | POA: Insufficient documentation

## 2014-12-14 DIAGNOSIS — F419 Anxiety disorder, unspecified: Secondary | ICD-10-CM | POA: Diagnosis not present

## 2014-12-14 DIAGNOSIS — N186 End stage renal disease: Secondary | ICD-10-CM | POA: Diagnosis not present

## 2014-12-14 DIAGNOSIS — R5383 Other fatigue: Secondary | ICD-10-CM | POA: Diagnosis present

## 2014-12-14 DIAGNOSIS — R0789 Other chest pain: Secondary | ICD-10-CM | POA: Insufficient documentation

## 2014-12-14 DIAGNOSIS — E875 Hyperkalemia: Secondary | ICD-10-CM | POA: Diagnosis not present

## 2014-12-14 DIAGNOSIS — Z87891 Personal history of nicotine dependence: Secondary | ICD-10-CM | POA: Diagnosis not present

## 2014-12-14 LAB — TROPONIN I: Troponin I: 0.08 ng/mL — ABNORMAL HIGH (ref <0.031)

## 2014-12-14 LAB — CBC
HCT: 27.4 % — ABNORMAL LOW (ref 39.0–52.0)
HEMOGLOBIN: 8.6 g/dL — AB (ref 13.0–17.0)
MCH: 26.1 pg (ref 26.0–34.0)
MCHC: 31.4 g/dL (ref 30.0–36.0)
MCV: 83 fL (ref 78.0–100.0)
Platelets: 168 10*3/uL (ref 150–400)
RBC: 3.3 MIL/uL — ABNORMAL LOW (ref 4.22–5.81)
RDW: 14.7 % (ref 11.5–15.5)
WBC: 7.1 10*3/uL (ref 4.0–10.5)

## 2014-12-14 LAB — RENAL FUNCTION PANEL
Albumin: 2.3 g/dL — ABNORMAL LOW (ref 3.5–5.0)
Anion gap: 11 (ref 5–15)
BUN: 26 mg/dL — ABNORMAL HIGH (ref 6–20)
CHLORIDE: 101 mmol/L (ref 101–111)
CO2: 27 mmol/L (ref 22–32)
CREATININE: 9.93 mg/dL — AB (ref 0.61–1.24)
Calcium: 7.8 mg/dL — ABNORMAL LOW (ref 8.9–10.3)
GFR calc non Af Amer: 5 mL/min — ABNORMAL LOW (ref >60)
GFR, EST AFRICAN AMERICAN: 6 mL/min — AB (ref >60)
Glucose, Bld: 73 mg/dL (ref 65–99)
PHOSPHORUS: 3.5 mg/dL (ref 2.5–4.6)
Potassium: 3.8 mmol/L (ref 3.5–5.1)
SODIUM: 139 mmol/L (ref 135–145)

## 2014-12-14 LAB — I-STAT CHEM 8, ED
BUN: 71 mg/dL — ABNORMAL HIGH (ref 6–20)
CHLORIDE: 103 mmol/L (ref 101–111)
Calcium, Ion: 0.92 mmol/L — ABNORMAL LOW (ref 1.12–1.23)
Creatinine, Ser: >18 mg/dL — ABNORMAL HIGH (ref 0.61–1.24)
GLUCOSE: 101 mg/dL — AB (ref 65–99)
HCT: 26 % — ABNORMAL LOW (ref 39.0–52.0)
HEMOGLOBIN: 8.8 g/dL — AB (ref 13.0–17.0)
POTASSIUM: 5.3 mmol/L — AB (ref 3.5–5.1)
SODIUM: 140 mmol/L (ref 135–145)
TCO2: 20 mmol/L (ref 0–100)

## 2014-12-14 MED ORDER — HYDRALAZINE HCL 10 MG PO TABS
20.0000 mg | ORAL_TABLET | Freq: Once | ORAL | Status: AC
  Administered 2014-12-14: 20 mg via ORAL
  Filled 2014-12-14: qty 2

## 2014-12-14 MED ORDER — LIDOCAINE HCL (PF) 1 % IJ SOLN: 5.0000 mL | INTRAMUSCULAR | Status: DC | PRN

## 2014-12-14 MED ORDER — PENTAFLUOROPROP-TETRAFLUOROETH EX AERO
1.0000 "application " | INHALATION_SPRAY | CUTANEOUS | Status: DC | PRN
  Filled 2014-12-14: qty 30

## 2014-12-14 MED ORDER — SODIUM CHLORIDE 0.9 % IV SOLN: 100.0000 mL | INTRAVENOUS | Status: DC | PRN

## 2014-12-14 MED ORDER — LISINOPRIL 20 MG PO TABS
20.0000 mg | ORAL_TABLET | Freq: Once | ORAL | Status: AC
  Administered 2014-12-14: 20 mg via ORAL
  Filled 2014-12-14: qty 1

## 2014-12-14 MED ORDER — HYDRALAZINE HCL 20 MG/ML IJ SOLN
20.0000 mg | Freq: Once | INTRAMUSCULAR | Status: DC
  Filled 2014-12-14: qty 1

## 2014-12-14 MED ORDER — HEPARIN SODIUM (PORCINE) 1000 UNIT/ML DIALYSIS
1000.0000 [IU] | INTRAMUSCULAR | Status: DC | PRN
  Filled 2014-12-14: qty 1

## 2014-12-14 MED ORDER — LIDOCAINE-PRILOCAINE 2.5-2.5 % EX CREA
1.0000 "application " | TOPICAL_CREAM | CUTANEOUS | Status: DC | PRN
  Filled 2014-12-14: qty 5

## 2014-12-14 MED ORDER — ALTEPLASE 2 MG IJ SOLR
2.0000 mg | Freq: Once | INTRAMUSCULAR | Status: DC | PRN
Start: 2014-12-14 — End: 2014-12-14
  Filled 2014-12-14: qty 2

## 2014-12-14 MED ORDER — NEPRO/CARBSTEADY PO LIQD
237.0000 mL | ORAL | Status: DC | PRN
  Filled 2014-12-14: qty 237

## 2014-12-14 MED ORDER — CARVEDILOL 25 MG PO TABS
25.0000 mg | ORAL_TABLET | Freq: Two times a day (BID) | ORAL | Status: DC
  Administered 2014-12-14: 25 mg via ORAL
  Filled 2014-12-14 (×2): qty 1

## 2014-12-14 MED ORDER — HEPARIN SODIUM (PORCINE) 1000 UNIT/ML DIALYSIS
20.0000 [IU]/kg | INTRAMUSCULAR | Status: DC | PRN
  Filled 2014-12-14: qty 2

## 2014-12-14 NOTE — ED Notes (Signed)
Pt finished dialysis treatment and refused discharge home due to complaints of chest pain ,pt placed back in Pod A for Md to evaluate

## 2014-12-14 NOTE — ED Notes (Signed)
Patient refuses to sign discharge, Patient refused to accept Discharge paperwork. Patient states, " I don't want that yall didn't do anything for me". Patient refused wheelchair.

## 2014-12-14 NOTE — ED Notes (Signed)
Phlebotomy at the bedside  

## 2014-12-14 NOTE — ED Notes (Signed)
Phlebotomy at bedside.

## 2014-12-14 NOTE — ED Notes (Signed)
Patient stated, "I am not signing anything in this kind of pain." Patient made aware that results came out showing he was able to get discharged and MD approved discharge. States the pain hurts with inspiration and expiration and with movement. Patient able to move up in the bed. Sitting in the hallway on the side of the bed. Made aware he needs to wait to have second BP taken.

## 2014-12-14 NOTE — Progress Notes (Signed)
Pt completed HD treatment. 5L fluid removed. C/o of right shoulder pain and pain in lungs. States he cannot take a deep breath. MD notified. Will evaluate patient.

## 2014-12-14 NOTE — Discharge Instructions (Signed)
Chest Pain (Nonspecific) °It is often hard to give a specific diagnosis for the cause of chest pain. There is always a chance that your pain could be related to something serious, such as a heart attack or a blood clot in the lungs. You need to follow up with your health care provider for further evaluation. °CAUSES  °· Heartburn. °· Pneumonia or bronchitis. °· Anxiety or stress. °· Inflammation around your heart (pericarditis) or lung (pleuritis or pleurisy). °· A blood clot in the lung. °· A collapsed lung (pneumothorax). It can develop suddenly on its own (spontaneous pneumothorax) or from trauma to the chest. °· Shingles infection (herpes zoster virus). °The chest wall is composed of bones, muscles, and cartilage. Any of these can be the source of the pain. °· The bones can be bruised by injury. °· The muscles or cartilage can be strained by coughing or overwork. °· The cartilage can be affected by inflammation and become sore (costochondritis). °DIAGNOSIS  °Lab tests or other studies may be needed to find the cause of your pain. Your health care provider may have you take a test called an ambulatory electrocardiogram (ECG). An ECG records your heartbeat patterns over a 24-hour period. You may also have other tests, such as: °· Transthoracic echocardiogram (TTE). During echocardiography, sound waves are used to evaluate how blood flows through your heart. °· Transesophageal echocardiogram (TEE). °· Cardiac monitoring. This allows your health care provider to monitor your heart rate and rhythm in real time. °· Holter monitor. This is a portable device that records your heartbeat and can help diagnose heart arrhythmias. It allows your health care provider to track your heart activity for several days, if needed. °· Stress tests by exercise or by giving medicine that makes the heart beat faster. °TREATMENT  °· Treatment depends on what may be causing your chest pain. Treatment may include: °· Acid blockers for  heartburn. °· Anti-inflammatory medicine. °· Pain medicine for inflammatory conditions. °· Antibiotics if an infection is present. °· You may be advised to change lifestyle habits. This includes stopping smoking and avoiding alcohol, caffeine, and chocolate. °· You may be advised to keep your head raised (elevated) when sleeping. This reduces the chance of acid going backward from your stomach into your esophagus. °Most of the time, nonspecific chest pain will improve within 2-3 days with rest and mild pain medicine.  °HOME CARE INSTRUCTIONS  °· If antibiotics were prescribed, take them as directed. Finish them even if you start to feel better. °· For the next few days, avoid physical activities that bring on chest pain. Continue physical activities as directed. °· Do not use any tobacco products, including cigarettes, chewing tobacco, or electronic cigarettes. °· Avoid drinking alcohol. °· Only take medicine as directed by your health care provider. °· Follow your health care provider's suggestions for further testing if your chest pain does not go away. °· Keep any follow-up appointments you made. If you do not go to an appointment, you could develop lasting (chronic) problems with pain. If there is any problem keeping an appointment, call to reschedule. °SEEK MEDICAL CARE IF:  °· Your chest pain does not go away, even after treatment. °· You have a rash with blisters on your chest. °· You have a fever. °SEEK IMMEDIATE MEDICAL CARE IF:  °· You have increased chest pain or pain that spreads to your arm, neck, jaw, back, or abdomen. °· You have shortness of breath. °· You have an increasing cough, or you cough   up blood.  You have severe back or abdominal pain.  You feel nauseous or vomit.  You have severe weakness.  You faint.  You have chills. This is an emergency. Do not wait to see if the pain will go away. Get medical help at once. Call your local emergency services (911 in [place] Do not drive  yourself to the hospital. MAKE SURE YOU:   Understand these instructions.  Will watch your condition.  Will get help right away if you are not doing well or get worse. Document Released: 04/17/2005 Document Revised: 07/13/2013 Document Reviewed: 02/11/2008 [facility] Patient Information 2015 [place], [place]. This information is not intended to replace advice given to you by your health care provider. Make sure you discuss any questions you have with your health care provider. Hemodialysis, Care After These instructions provide you with information on caring for yourself after your hemodialysis session. Your health care provider may also give you more specific instructions. Your treatment has been planned according to current medical practices, but problems sometimes occur. Call your health care provider if you have any problems or questions after your procedure. HOME CARE INSTRUCTIONS  General Instructions   Take medicines only as directed by your health care provider.  Keep all dialysis appointments. Dialysis appointments should be scheduled regularly. Do not skip an appointment.  Carry a wallet card, bracelet, or medical identification tag that shows you are a dialysis patient. Always keep it with you. If you are in an accident or have a medical emergency and cannot communicate, this item will inform health care providers about your condition. Diet Instructions  Talk to your health care provider about how much fluid you can consume. Keep track of your fluid intake to make sure you do not consume more fluid than is allowed. This is important because fluid can build up in your body between dialysis sessions. Built-up fluid affects your blood pressure and can make your heart work harder.  Limit your sodium intake. This is important because sodium makes you thirsty and the amount of fluid you can drink every day is limited.  Limit your intake of the mineral potassium. Potassium levels can rise  between dialysis sessions. If they become too high, they can cause a dangerous heart rhythm and even death.  Limit your intake of the mineral phosphorus. Consuming too much phosphorus can cause your bones to lose calcium. This makes them weaker and more likely to break.  Eat high-quality proteins that are low in phosphorus. High-quality proteins include those found in fish, poultry, and eggs.  Take vitamin and mineral supplements as directed by your health care provider.  Talk with your health care provider before taking new supplements. Vascular Access Instructions  Avoid sleeping on your vascular access.  If your vascular access is on your arm:  Do not lift weights or heavy objects with that arm.  Do not wear clothing that is tight over that arm.  Do not have your blood pressure measured on that arm.  Do notallow needle punctures (such as for taking blood) on that arm.  If you have a catheter, do not open it between dialysis sessions.  If you have a fistula or graft, wash it with antibacterial soap every day and before each dialysis session.  If you have a fistula or graft, feel for a vibration over it (thrill) every day. A thrill means the access is working. SEEK MEDICAL CARE IF:  You have a fever.  You have chills.  Your vascular access  feels warm.  You find a pimple on your vascular access.  There is swelling or redness around the vascular access site.  There is drainage or bleeding at the vascular access site. SEEK IMMEDIATE MEDICAL CARE IF:  You have a fistula, and pain, numbness, or unusual paleness develops in the hand on the side of your fistula.  You have a fistula or graft and do not feel a thrill.  You develop dizziness or weakness that you have not had before.  You develop shortness of breath.  You develop chest pain.  There is pus at the vascular access site.  There is bleeding at the vascular access site that cannot be easily  controlled. Document Released: 11/02/2012 Document Revised: 11/22/2013 Document Reviewed: 11/02/2012 [facility] Patient Information 2015 [place], [place]. This information is not intended to replace advice given to you by your health care provider. Make sure you discuss any questions you have with your health care provider.

## 2014-12-14 NOTE — ED Provider Notes (Signed)
Nursing staff has advised me that the patient is brought back from dialysis. He had apparently come to the emergency department for his dialysis which has been done but while in dialysis now began complaining of chest pain and thus is sent back to the emergency department for reevaluation and consideration for chest pain now status post dialysis.  The patient states he's had left-sided chest pain for over 5 days. It's sharp in nature. It's worse with movements. No fever cough or sputum production. The patient advises he did have a recent admission for chest pain to the hospital.  On physical examination he is well in appearance. I am seeing him after his dialysis session as he was brought back. He is a nontoxic and has no respiratory distress. Breath sounds are clear. There is no rhonchi or rail. Heart is regular without rub murmur gallop. Patient winces and endorses severe pain to palpation as I palpate his left chest wall. There is no visible rash or injury pattern. His lower extremities do not have any peripheral edema in the calves are soft and nontender.  Reviewing the patient's medical record he has had recent admission for chest pain and a CT PE study done within the last  2 months. At this time I doubt PE given the recent study and quality of chest pain with reproducibility. The patient has not had his blood pressure medications today. He will be given his regular medications and advised to continue his outpatient medications with close follow-up with his physicians.  Charlesetta Shanks, MD 12/14/14 [phone]

## 2014-12-14 NOTE — ED Notes (Signed)
MD made aware of patient's Troponin and stated, "The patient has a chronic condition and all other labs and results are consistent with patient's history." MD approves patient to be discharged.

## 2014-12-14 NOTE — ED Notes (Signed)
The patient is a dialysis patient and he originally came Monday to have dialysis done.  He had dialysis done Monday and they 8lbs off of him and told him to come back today.  He is still 23lbs over weight and is here to have dialysis done.  He was in the cafeteria and he just does not feel well.  He is also nauseous and vomiting could not keep his breakfast down.  He is complaining of shoulder pain and side pain and he says it is due to his per cath.  He rates his pain 7/10.

## 2014-12-14 NOTE — ED Provider Notes (Signed)
CSN: [id number]     Arrival date & time 12/14/14  2202 History   First MD Initiated Contact with Patient 12/14/14 [phone]     Chief Complaint  Patient presents with  . Fatigue    The patient is a dialysis patient and he originally came Monday to have dialysis done.  He had dialysis done Monday and they 8lbs off of him and told him to come back today.     (Consider location/radiation/quality/duration/timing/severity/associated sxs/prior Treatment) HPI Comments: Patient is a 49 year old male with history of end-stage renal disease on hemodialysis and peritoneal dialysis. He had been performing peritoneal dialysis for approximately one month, then ran out of diasylate and has not had this performed. He was seen here 2 days ago for volume overload and hyperkalemia. He underwent hemodialysis and had fluid removed. He was told to come back today for repeat dialysis.  His prior dialysis was performed at [facility], however he has relocated here and has no dialysis center locally. From reading the notes, it would appear as though arrangements are being made for him to find a dialysis center and [place].  Patient reports some nausea, generalized malaise, feeling somewhat short of breath. He denies fevers, chills, or productive cough.  The history is provided by the patient.    Past Medical History  Diagnosis Date  . Hypertension   . Depression   . Complication of anesthesia     itching, sore throat  . Diabetes mellitus without complication     No history per patient, but remains under history as A1c would not be accurate given on dialysis  . Shortness of breath   . Anxiety   . ESRD (end stage renal disease)     due to HTN per patient, followed at [facility], s/p failed kidney transplant - dialysis Tue, Th, Sat  . Renal insufficiency   . CHF (congestive heart failure)   . Anemia   . Dialysis patient    Past Surgical History  Procedure Laterality Date  . Kidney receipient  2006    failed  and started HD in March 2014  . Capd insertion    . Capd removal    . Left heart catheterization with coronary angiogram N/A 09/02/2014    Procedure: LEFT HEART CATHETERIZATION WITH CORONARY ANGIOGRAM;  Surgeon: [name], MD;  Location: [facility] CATH LAB;  Service: Cardiovascular;  Laterality: N/A;   History reviewed. No pertinent family history. History  Substance Use Topics  . Smoking status: Former Smoker -- 1.00 packs/day for 1 years    Types: Cigarettes  . Smokeless tobacco: Never Used     Comment: quit Jan 2014  . Alcohol Use: No    Review of Systems  All other systems reviewed and are negative.     Allergies  Ferrlecit and Darvocet  Home Medications   Prior to Admission medications   Medication Sig Start Date End Date Taking? Authorizing Provider  atorvastatin (LIPITOR) 40 MG tablet Take 1 tablet (40 mg total) by mouth daily at 6 PM. 09/02/14   Barton Dubois, MD  carvedilol (COREG) 25 MG tablet Take 1 tablet (25 mg total) by mouth 2 (two) times daily with a meal. 10/10/14   [name], DO  cinacalcet (SENSIPAR) 30 MG tablet Take 30 mg by mouth daily.    Historical Provider, MD  diclofenac sodium (VOLTAREN) 1 % GEL Apply 2 g topically 4 (four) times daily. 11/14/14   Tresa Garter, MD  docusate sodium (COLACE) 100 MG capsule  Take 1 capsule (100 mg total) by mouth 2 (two) times daily. 11/19/14   [name], MD  doxercalciferol (HECTOROL) 4 MCG/2ML injection Inject 1.25 mLs (2.5 mcg total) into the vein Every Tuesday,Thursday,and Saturday with dialysis. 10/10/14   [name], DO  levalbuterol (XOPENEX HFA) 45 MCG/ACT inhaler Inhale 2 puffs into the lungs every 6 (six) hours as needed for wheezing or shortness of breath. 11/19/14   [name], MD  lisinopril (PRINIVIL,ZESTRIL) 20 MG tablet Take 1 tablet (20 mg total) by mouth daily. 12/05/14   Verlee Monte, MD  LORazepam (ATIVAN) 0.5 MG tablet Take 1 tablet (0.5 mg total) by mouth at bedtime as needed for  anxiety. 11/19/14   [name], MD  methocarbamol (ROBAXIN) 500 MG tablet Take 1 tablet (500 mg total) by mouth every 8 (eight) hours as needed for muscle spasms. 11/19/14   [name], MD  omeprazole (PRILOSEC) 20 MG capsule Take 20 mg by mouth daily. 07/01/13   Historical Provider, MD  ondansetron (ZOFRAN-ODT) 4 MG disintegrating tablet Take 1 tablet (4 mg total) by mouth every 8 (eight) hours as needed for nausea or vomiting. 12/05/14   Verlee Monte, MD  oxyCODONE (ROXICODONE) 5 MG immediate release tablet Take 1 tablet (5 mg total) by mouth every 6 (six) hours as needed for severe pain. Patient not taking: Reported on 11/28/2014 11/19/14   [name], MD  predniSONE (DELTASONE) 5 MG tablet Take 5 mg by mouth daily with breakfast.    Historical Provider, MD  sevelamer carbonate (RENVELA) 800 MG tablet Take 1,600 mg by mouth 3 (three) times daily with meals. 12/27/13 12/27/14  Historical Provider, MD  warfarin (COUMADIN) 4 MG tablet Take 1 tablet (4 mg total) by mouth daily at 6 PM. 12/05/14   Verlee Monte, MD   BP 205/124 mmHg  Pulse 74  Temp(Src) 98.4 F (36.9 C) (Oral)  Resp 15  Ht _0  (1.88 m)  Wt 192 lb (87.091 kg)  BMI 24.64 kg/m2  SpO2 98% Physical Exam  Constitutional: He is oriented to person, place, and time. No distress.  Awake, alert, nontoxic appearance. He appears chronically ill.  HENT:  Head: Normocephalic and atraumatic.  Neck: Normal range of motion. Neck supple.  Cardiovascular: Normal rate, regular rhythm and normal heart sounds.   No murmur heard. Pulmonary/Chest: Effort normal and breath sounds normal. No respiratory distress. He has no wheezes. He exhibits no tenderness.  Abdominal: Soft. Bowel sounds are normal. He exhibits no distension. There is no tenderness. There is no rebound.  Musculoskeletal: Normal range of motion. He exhibits edema. He exhibits no tenderness.  There is 1+ pitting edema of the bilateral lower extremities.  Lymphadenopathy:    He  has no cervical adenopathy.  Neurological: He is alert and oriented to person, place, and time.  Mental status and motor strength appears baseline for patient and situation.  Skin: Skin is warm and dry. No rash noted. He is not diaphoretic.  Psychiatric: He has a normal mood and affect.  Nursing note and vitals reviewed.   ED Course  Procedures (including critical care time) Labs Review Labs Reviewed  I-STAT CHEM 8, ED    Imaging Review Dg Chest Port 1 View  12/12/2014   CLINICAL DATA:  Shortness of breath, dialysis  EXAM: PORTABLE CHEST - 1 VIEW  COMPARISON:  11/28/2014  FINDINGS: Cardiomegaly again noted. Dual lumen right IJ dialysis catheter with tip in right atrium is unchanged in position. No acute  infiltrate or pleural effusion. No pulmonary edema.  IMPRESSION: No active disease.   Electronically Signed   By: Lahoma Crocker M.D.   On: 12/12/2014 13:23     EKG Interpretation   Date/Time:  Wednesday Dec 14 2014 07:34:58 EDT Ventricular Rate:  74 PR Interval:  167 QRS Duration: 88 QT Interval:  439 QTC Calculation: 487 R Axis:     Text Interpretation:  Sinus rhythm Nonspecific T abnormalities, lateral  leads Borderline prolonged QT interval Baseline wander in lead(s) V1  Confirmed by Kathy Wahid  MD, Tanis Burnley ([id number]) on 12/14/2014 7:44:04 AM      MDM   Final diagnoses:  None    Patient presents for dialysis. He was dialyzed 2 days ago and was told to return for an additional treatment today. His laboratory studies reveal a slight elevation of his potassium, however there are no EKG changes. Does not appear acidotic or acutely ill. I've spoken with Dr. Justin Mend who will make arrangements for the patient to undergo dialysis, then be discharged.    Veryl Speak, MD 12/14/14 [phone]

## 2014-12-16 ENCOUNTER — Encounter (HOSPITAL_COMMUNITY): Payer: Self-pay | Admitting: Emergency Medicine

## 2014-12-16 ENCOUNTER — Emergency Department (HOSPITAL_COMMUNITY): Payer: Medicare Other

## 2014-12-16 ENCOUNTER — Emergency Department (HOSPITAL_COMMUNITY)
Admission: EM | Admit: 2014-12-16 | Discharge: 2014-12-16 | Disposition: A | Discharge status: Home / Self Care | Payer: Medicare Other | Attending: Emergency Medicine | Admitting: Emergency Medicine

## 2014-12-16 DIAGNOSIS — N50819 Testicular pain, unspecified: Secondary | ICD-10-CM

## 2014-12-16 DIAGNOSIS — Z992 Dependence on renal dialysis: Secondary | ICD-10-CM | POA: Insufficient documentation

## 2014-12-16 DIAGNOSIS — I509 Heart failure, unspecified: Secondary | ICD-10-CM | POA: Diagnosis not present

## 2014-12-16 DIAGNOSIS — Z791 Long term (current) use of non-steroidal anti-inflammatories (NSAID): Secondary | ICD-10-CM | POA: Diagnosis not present

## 2014-12-16 DIAGNOSIS — Z862 Personal history of diseases of the blood and blood-forming organs and certain disorders involving the immune mechanism: Secondary | ICD-10-CM | POA: Diagnosis not present

## 2014-12-16 DIAGNOSIS — Z9889 Other specified postprocedural states: Secondary | ICD-10-CM | POA: Insufficient documentation

## 2014-12-16 DIAGNOSIS — Z7901 Long term (current) use of anticoagulants: Secondary | ICD-10-CM | POA: Diagnosis not present

## 2014-12-16 DIAGNOSIS — Z87891 Personal history of nicotine dependence: Secondary | ICD-10-CM | POA: Insufficient documentation

## 2014-12-16 DIAGNOSIS — N186 End stage renal disease: Secondary | ICD-10-CM | POA: Insufficient documentation

## 2014-12-16 DIAGNOSIS — N451 Epididymitis: Secondary | ICD-10-CM | POA: Insufficient documentation

## 2014-12-16 DIAGNOSIS — F419 Anxiety disorder, unspecified: Secondary | ICD-10-CM | POA: Diagnosis not present

## 2014-12-16 DIAGNOSIS — R1031 Right lower quadrant pain: Secondary | ICD-10-CM | POA: Diagnosis present

## 2014-12-16 DIAGNOSIS — E119 Type 2 diabetes mellitus without complications: Secondary | ICD-10-CM | POA: Insufficient documentation

## 2014-12-16 DIAGNOSIS — I120 Hypertensive chronic kidney disease with stage 5 chronic kidney disease or end stage renal disease: Secondary | ICD-10-CM | POA: Insufficient documentation

## 2014-12-16 DIAGNOSIS — Z79899 Other long term (current) drug therapy: Secondary | ICD-10-CM | POA: Diagnosis not present

## 2014-12-16 DIAGNOSIS — Z7952 Long term (current) use of systemic steroids: Secondary | ICD-10-CM | POA: Diagnosis not present

## 2014-12-16 LAB — BASIC METABOLIC PANEL
Anion gap: 16 — ABNORMAL HIGH (ref 5–15)
BUN: 38 mg/dL — ABNORMAL HIGH (ref 6–20)
CALCIUM: 8.3 mg/dL — AB (ref 8.9–10.3)
CHLORIDE: 99 mmol/L — AB (ref 101–111)
CO2: 23 mmol/L (ref 22–32)
Creatinine, Ser: 15.29 mg/dL — ABNORMAL HIGH (ref 0.61–1.24)
GFR calc non Af Amer: 3 mL/min — ABNORMAL LOW (ref >60)
GFR, EST AFRICAN AMERICAN: 4 mL/min — AB (ref >60)
GLUCOSE: 77 mg/dL (ref 65–99)
Potassium: 5.1 mmol/L (ref 3.5–5.1)
SODIUM: 138 mmol/L (ref 135–145)

## 2014-12-16 LAB — CBC WITH DIFFERENTIAL/PLATELET
BASOS PCT: 0 % (ref 0–1)
Basophils Absolute: 0 10*3/uL (ref 0.0–0.1)
EOS ABS: 0.2 10*3/uL (ref 0.0–0.7)
EOS PCT: 2 % (ref 0–5)
HCT: 26.4 % — ABNORMAL LOW (ref 39.0–52.0)
Hemoglobin: 8.5 g/dL — ABNORMAL LOW (ref 13.0–17.0)
LYMPHS PCT: 17 % (ref 12–46)
Lymphs Abs: 1.3 10*3/uL (ref 0.7–4.0)
MCH: 27.4 pg (ref 26.0–34.0)
MCHC: 32.2 g/dL (ref 30.0–36.0)
MCV: 85.2 fL (ref 78.0–100.0)
MONO ABS: 0.9 10*3/uL (ref 0.1–1.0)
Monocytes Relative: 12 % (ref 3–12)
Neutro Abs: 5.1 10*3/uL (ref 1.7–7.7)
Neutrophils Relative %: 69 % (ref 43–77)
Platelets: 180 10*3/uL (ref 150–400)
RBC: 3.1 MIL/uL — ABNORMAL LOW (ref 4.22–5.81)
RDW: 14.7 % (ref 11.5–15.5)
WBC: 7.5 10*3/uL (ref 4.0–10.5)

## 2014-12-16 LAB — PROTIME-INR
INR: 1.17 (ref 0.00–1.49)
Prothrombin Time: 15.1 seconds (ref 11.6–15.2)

## 2014-12-16 MED ORDER — ENOXAPARIN SODIUM 80 MG/0.8ML ~~LOC~~ SOLN: 80.0000 mg | SUBCUTANEOUS | Status: DC

## 2014-12-16 MED ORDER — HEPARIN SODIUM (PORCINE) 1000 UNIT/ML DIALYSIS: 1000.0000 [IU] | INTRAMUSCULAR | Status: DC | PRN

## 2014-12-16 MED ORDER — ACETAMINOPHEN 325 MG PO TABS
650.0000 mg | ORAL_TABLET | Freq: Once | ORAL | Status: AC
  Administered 2014-12-16: 650 mg via ORAL
  Filled 2014-12-16: qty 2

## 2014-12-16 MED ORDER — ALTEPLASE 2 MG IJ SOLR: 2.0000 mg | Freq: Once | INTRAMUSCULAR | Status: DC | PRN

## 2014-12-16 MED ORDER — CIPROFLOXACIN HCL 500 MG PO TABS
500.0000 mg | ORAL_TABLET | Freq: Once | ORAL | Status: AC
  Administered 2014-12-16: 500 mg via ORAL
  Filled 2014-12-16: qty 1

## 2014-12-16 MED ORDER — PENTAFLUOROPROP-TETRAFLUOROETH EX AERO: 1.0000 "application " | INHALATION_SPRAY | CUTANEOUS | Status: DC | PRN

## 2014-12-16 MED ORDER — CIPROFLOXACIN HCL 500 MG PO TABS: 500.0000 mg | ORAL_TABLET | Freq: Every day | ORAL | Status: DC

## 2014-12-16 MED ORDER — LIDOCAINE HCL (PF) 1 % IJ SOLN
INTRAMUSCULAR | Status: AC
  Administered 2014-12-16: 2.1 mL
  Filled 2014-12-16: qty 5

## 2014-12-16 MED ORDER — SODIUM CHLORIDE 0.9 % IV SOLN: 100.0000 mL | INTRAVENOUS | Status: DC | PRN

## 2014-12-16 MED ORDER — CEFTRIAXONE SODIUM 1 G IJ SOLR
1.0000 g | Freq: Once | INTRAMUSCULAR | Status: AC
  Administered 2014-12-16: 1 g via INTRAMUSCULAR
  Filled 2014-12-16: qty 10

## 2014-12-16 MED ORDER — LIDOCAINE HCL (PF) 1 % IJ SOLN: 5.0000 mL | INTRAMUSCULAR | Status: DC | PRN

## 2014-12-16 MED ORDER — LIDOCAINE-PRILOCAINE 2.5-2.5 % EX CREA: 1.0000 "application " | TOPICAL_CREAM | CUTANEOUS | Status: DC | PRN

## 2014-12-16 MED ORDER — NEPRO/CARBSTEADY PO LIQD: 237.0000 mL | ORAL | Status: DC | PRN

## 2014-12-16 NOTE — ED Notes (Signed)
Patient coming from home with bilateral groin pain x 2 days, described as a burning, aching sensation 7/10.  Dialysis every Monday, Wednesday and Friday.

## 2014-12-16 NOTE — ED Provider Notes (Signed)
CSN: [id number]     Arrival date & time 12/16/14  1029 History   First MD Initiated Contact with Patient 12/16/14 1051     Chief Complaint  Patient presents with  . Groin Pain    bilateral     (Consider location/radiation/quality/duration/timing/severity/associated sxs/prior Treatment) Patient is a 49 y.o. male presenting with groin pain. The history is provided by the patient and medical records.  Groin Pain   This is a 49 year old male with past medical history significant for hypertension, depression, anxiety, congestive heart failure, anemia, diabetes, end-stage renal disease currently on peritoneal dialysis (scheduled M, W, F) presenting to the ED for groin pain.  Patient states he has had bilateral groin pain for the past 2 days, described as a aching and burning sensation. He states he has the urge to urinate but has been unable to do so. He does intermittently make urine. He now reports right testicle pain. He denies any pelvic trauma. No new sexual partners or concern for STD, states he has not been sexually active in several months. Patient also needs his dialysis. He was last dialyzed on Wednesday. He is currently on peritoneal dialysis but is largely noncompliant and was recommended by [facility] nephrology that he return to hemodialysis, however this will be left to the discretion of his primary nephrologist in [facility]. Patient states he wants to continue peritoneal dialysis, primary nephrologist is trying to complete paperwork for this presently, however he does require hemodialysis in the interim.  Patient denies any chest pain or SOB.    Past Medical History  Diagnosis Date  . Hypertension   . Depression   . Complication of anesthesia     itching, sore throat  . Diabetes mellitus without complication     No history per patient, but remains under history as A1c would not be accurate given on dialysis  . Shortness of breath   . Anxiety   . ESRD (end stage renal disease)      due to HTN per patient, followed at [facility], s/p failed kidney transplant - dialysis Tue, Th, Sat  . Renal insufficiency   . CHF (congestive heart failure)   . Anemia   . Dialysis patient    Past Surgical History  Procedure Laterality Date  . Kidney receipient  2006    failed and started HD in March 2014  . Capd insertion    . Capd removal    . Left heart catheterization with coronary angiogram N/A 09/02/2014    Procedure: LEFT HEART CATHETERIZATION WITH CORONARY ANGIOGRAM;  Surgeon: [name], MD;  Location: [facility] CATH LAB;  Service: Cardiovascular;  Laterality: N/A;   No family history on file. History  Substance Use Topics  . Smoking status: Former Smoker -- 1.00 packs/day for 1 years    Types: Cigarettes  . Smokeless tobacco: Never Used     Comment: quit Jan 2014  . Alcohol Use: No    Review of Systems  Genitourinary: Positive for testicular pain.  All other systems reviewed and are negative.     Allergies  Ferrlecit and Darvocet  Home Medications   Prior to Admission medications   Medication Sig Start Date End Date Taking? Authorizing Provider  atorvastatin (LIPITOR) 40 MG tablet Take 1 tablet (40 mg total) by mouth daily at 6 PM. 09/02/14   Barton Dubois, MD  carvedilol (COREG) 25 MG tablet Take 1 tablet (25 mg total) by mouth 2 (two) times daily with a meal. 10/10/14   Tomi Bamberger  Vann, DO  cinacalcet (SENSIPAR) 30 MG tablet Take 30 mg by mouth daily.    Historical Provider, MD  diclofenac sodium (VOLTAREN) 1 % GEL Apply 2 g topically 4 (four) times daily. 11/14/14   Tresa Garter, MD  docusate sodium (COLACE) 100 MG capsule Take 1 capsule (100 mg total) by mouth 2 (two) times daily. 11/19/14   [name], MD  doxercalciferol (HECTOROL) 4 MCG/2ML injection Inject 1.25 mLs (2.5 mcg total) into the vein Every Tuesday,Thursday,and Saturday with dialysis. 10/10/14   [name], DO  levalbuterol (XOPENEX HFA) 45 MCG/ACT inhaler Inhale 2 puffs into the lungs  every 6 (six) hours as needed for wheezing or shortness of breath. 11/19/14   [name], MD  lisinopril (PRINIVIL,ZESTRIL) 20 MG tablet Take 1 tablet (20 mg total) by mouth daily. 12/05/14   Verlee Monte, MD  LORazepam (ATIVAN) 0.5 MG tablet Take 1 tablet (0.5 mg total) by mouth at bedtime as needed for anxiety. 11/19/14   [name], MD  methocarbamol (ROBAXIN) 500 MG tablet Take 1 tablet (500 mg total) by mouth every 8 (eight) hours as needed for muscle spasms. 11/19/14   [name], MD  omeprazole (PRILOSEC) 20 MG capsule Take 20 mg by mouth daily. 07/01/13   Historical Provider, MD  ondansetron (ZOFRAN-ODT) 4 MG disintegrating tablet Take 1 tablet (4 mg total) by mouth every 8 (eight) hours as needed for nausea or vomiting. 12/05/14   Verlee Monte, MD  oxyCODONE (ROXICODONE) 5 MG immediate release tablet Take 1 tablet (5 mg total) by mouth every 6 (six) hours as needed for severe pain. Patient not taking: Reported on 11/28/2014 11/19/14   [name], MD  predniSONE (DELTASONE) 5 MG tablet Take 5 mg by mouth daily with breakfast.    Historical Provider, MD  sevelamer carbonate (RENVELA) 800 MG tablet Take 1,600 mg by mouth 3 (three) times daily with meals. 12/27/13 12/27/14  Historical Provider, MD  warfarin (COUMADIN) 4 MG tablet Take 1 tablet (4 mg total) by mouth daily at 6 PM. 12/05/14   Verlee Monte, MD   BP 219/136 mmHg  Pulse 76  Temp(Src) 98.4 F (36.9 C) (Oral)  Resp 17  Ht _0  (1.88 m)  Wt 174 lb 1.6 oz (78.971 kg)  BMI 22.34 kg/m2  SpO2 98%   Physical Exam  Constitutional: He is oriented to person, place, and time. He appears well-developed and well-nourished. No distress.  HENT:  Head: Normocephalic and atraumatic.  Mouth/Throat: Oropharynx is clear and moist.  Eyes: Conjunctivae and EOM are normal. Pupils are equal, round, and reactive to light.  Neck: Normal range of motion. Neck supple.  Cardiovascular: Normal rate, regular rhythm and normal heart sounds.    Pulmonary/Chest: Effort normal and breath sounds normal. No respiratory distress. He has no wheezes.  Dialysis catheter right chest wall, clean without signs of infection  Abdominal: Soft. Bowel sounds are normal. Hernia confirmed negative in the right inguinal area and confirmed negative in the left inguinal area.  Peritoneal dialysis catheter present, clean  Genitourinary: Penis normal. Right testis shows tenderness. Right testis shows no swelling. Right testis is descended. Left testis shows no swelling and no tenderness. Circumcised.  Tenderness along right lateral testicle without mass or visible swelling, normal lie; penis circumcised, no discharge or erythema; no appreciable hernia  Musculoskeletal: Normal range of motion. He exhibits no edema.  Neurological: He is alert and oriented to person, place, and time.  Skin: Skin is warm  and dry. He is not diaphoretic.  Psychiatric: He has a normal mood and affect.  Nursing note and vitals reviewed.   ED Course  Procedures (including critical care time) Labs Review Labs Reviewed  CBC WITH DIFFERENTIAL/PLATELET - Abnormal; Notable for the following:    RBC 3.10 (*)    Hemoglobin 8.5 (*)    HCT 26.4 (*)    All other components within normal limits  BASIC METABOLIC PANEL - Abnormal; Notable for the following:    Chloride 99 (*)    BUN 38 (*)    Creatinine, Ser 15.29 (*)    Calcium 8.3 (*)    GFR calc non Af Amer 3 (*)    GFR calc Af Amer 4 (*)    Anion gap 16 (*)    All other components within normal limits  PROTIME-INR  URINALYSIS, ROUTINE W REFLEX MICROSCOPIC (NOT AT [facility])    Imaging Review US Scrotum  12/16/2014   CLINICAL DATA:  1-2 days of bilateral testicular pain  EXAM: SCROTAL ULTRASOUND  DOPPLER ULTRASOUND OF THE TESTICLES  TECHNIQUE: Complete ultrasound examination of the testicles, epididymis, and other scrotal structures was performed. Color and spectral Doppler ultrasound were also utilized to evaluate blood flow to  the testicles.  COMPARISON:  11/05/2014  FINDINGS: Right testicle  Measurements: 4.2 x 2.2 x 3.3 cm. No mass or microlithiasis visualized.  Left testicle  Measurements: 4.1 x 2.2 x 3.4 cm. No mass or microlithiasis visualized.  Right epididymis:  Enlarged with increased blood flow  Left epididymis:  Enlarged with increased blood flow  Hydrocele:  None visualized.  Varicocele:  Bilateral varicoceles.  Pulsed Doppler interrogation of both testes demonstrates normal low resistance arterial and venous waveforms bilaterally.  Other: There is an well-circumscribed, anechoic structure within the right inguinal canal measuring 3.7 x 2.0 x 4.5 cm.  IMPRESSION: 1. No evidence for testicular mass or torsion. 2. Bilateral epididymitis. 3. Bilateral varicoceles 4. Right inguinal canal cyst.   Electronically Signed   By: Kerby Moors M.D.   On: 12/16/2014 12:50   [place] Art/ven Flow Abd Pelv Doppler  12/16/2014   CLINICAL DATA:  1-2 days of bilateral testicular pain  EXAM: SCROTAL ULTRASOUND  DOPPLER ULTRASOUND OF THE TESTICLES  TECHNIQUE: Complete ultrasound examination of the testicles, epididymis, and other scrotal structures was performed. Color and spectral Doppler ultrasound were also utilized to evaluate blood flow to the testicles.  COMPARISON:  11/05/2014  FINDINGS: Right testicle  Measurements: 4.2 x 2.2 x 3.3 cm. No mass or microlithiasis visualized.  Left testicle  Measurements: 4.1 x 2.2 x 3.4 cm. No mass or microlithiasis visualized.  Right epididymis:  Enlarged with increased blood flow  Left epididymis:  Enlarged with increased blood flow  Hydrocele:  None visualized.  Varicocele:  Bilateral varicoceles.  Pulsed Doppler interrogation of both testes demonstrates normal low resistance arterial and venous waveforms bilaterally.  Other: There is an well-circumscribed, anechoic structure within the right inguinal canal measuring 3.7 x 2.0 x 4.5 cm.  IMPRESSION: 1. No evidence for testicular mass or torsion. 2.  Bilateral epididymitis. 3. Bilateral varicoceles 4. Right inguinal canal cyst.   Electronically Signed   By: Kerby Moors M.D.   On: 12/16/2014 12:50     EKG Interpretation None      MDM   Final diagnoses:  Testicle pain  Epididymitis  ESRD (end stage renal disease)   49 year old male here with bilateral groin pain. He reports it feels like a pressure sensation and radiates into  his right testicle. He has no appreciable hernia on exam but right lateral testicle is tender to palpation. There is no noted mass or abscess. No penile discharge.  He denies any new sexual partners and has no concern for STD. He intermittently makes urine, no dysuria.   Lab work here appears baseline for patient-- K+ WNL.  EKG without peaked t-waves.  No reported CP or SOB.  Patient unable to provide urine sample here.  U/S with evidence of bilateral epididymitis. Patient again denies concern for STD. Will treat with IM Rocephin here and outpatient ciprofloxacin (renal dosed).  Will be given urology follow-up.    3:21 PM Patient still waiting for dialysis, OP meds written for treatment of epididymitis and is ok to be discharged after treatment.  Larene Pickett, PA-C 12/16/14 1521  Ezequiel Essex, MD 12/17/14 [phone]

## 2014-12-16 NOTE — ED Notes (Signed)
PATIENT SAY HE DOESN'T MAKE ANY URINE.

## 2014-12-16 NOTE — Discharge Instructions (Signed)
Take the prescribed medication as directed to help with your testicle infection.  This medication may increase the effects of your coumadin so be sure to monitor closely. Follow-up with urologist-- call to make appt. Return to the ED for new or worsening symptoms.

## 2014-12-16 NOTE — ED Notes (Signed)
Patient returned from Ultrasound.

## 2014-12-19 ENCOUNTER — Encounter (HOSPITAL_COMMUNITY): Payer: Self-pay | Admitting: Nurse Practitioner

## 2014-12-19 ENCOUNTER — Non-Acute Institutional Stay (HOSPITAL_COMMUNITY)
Admission: EM | Admit: 2014-12-19 | Discharge: 2014-12-20 | Disposition: A | Discharge status: Home / Self Care | Payer: Medicare Other | Attending: Emergency Medicine | Admitting: Emergency Medicine

## 2014-12-19 DIAGNOSIS — N186 End stage renal disease: Secondary | ICD-10-CM | POA: Insufficient documentation

## 2014-12-19 DIAGNOSIS — Z7901 Long term (current) use of anticoagulants: Secondary | ICD-10-CM | POA: Diagnosis not present

## 2014-12-19 DIAGNOSIS — I509 Heart failure, unspecified: Secondary | ICD-10-CM | POA: Diagnosis not present

## 2014-12-19 DIAGNOSIS — Z86718 Personal history of other venous thrombosis and embolism: Secondary | ICD-10-CM | POA: Diagnosis not present

## 2014-12-19 DIAGNOSIS — Z87891 Personal history of nicotine dependence: Secondary | ICD-10-CM | POA: Insufficient documentation

## 2014-12-19 DIAGNOSIS — Z86711 Personal history of pulmonary embolism: Secondary | ICD-10-CM | POA: Insufficient documentation

## 2014-12-19 DIAGNOSIS — E119 Type 2 diabetes mellitus without complications: Secondary | ICD-10-CM | POA: Insufficient documentation

## 2014-12-19 DIAGNOSIS — Z992 Dependence on renal dialysis: Secondary | ICD-10-CM | POA: Insufficient documentation

## 2014-12-19 DIAGNOSIS — I120 Hypertensive chronic kidney disease with stage 5 chronic kidney disease or end stage renal disease: Secondary | ICD-10-CM | POA: Insufficient documentation

## 2014-12-19 DIAGNOSIS — R112 Nausea with vomiting, unspecified: Secondary | ICD-10-CM | POA: Diagnosis not present

## 2014-12-19 DIAGNOSIS — N19 Unspecified kidney failure: Secondary | ICD-10-CM

## 2014-12-19 LAB — I-STAT CHEM 8, ED
BUN: 39 mg/dL — ABNORMAL HIGH (ref 6–20)
CALCIUM ION: 1 mmol/L — AB (ref 1.12–1.23)
CREATININE: 15.5 mg/dL — AB (ref 0.61–1.24)
Chloride: 102 mmol/L (ref 101–111)
Glucose, Bld: 116 mg/dL — ABNORMAL HIGH (ref 65–99)
HCT: 28 % — ABNORMAL LOW (ref 39.0–52.0)
HEMOGLOBIN: 9.5 g/dL — AB (ref 13.0–17.0)
POTASSIUM: 4.8 mmol/L (ref 3.5–5.1)
Sodium: 140 mmol/L (ref 135–145)
TCO2: 24 mmol/L (ref 0–100)

## 2014-12-19 MED ORDER — LIDOCAINE HCL (PF) 1 % IJ SOLN: 5.0000 mL | INTRAMUSCULAR | Status: DC | PRN

## 2014-12-19 MED ORDER — LIDOCAINE-PRILOCAINE 2.5-2.5 % EX CREA
1.0000 "application " | TOPICAL_CREAM | CUTANEOUS | Status: DC | PRN
  Filled 2014-12-19: qty 5

## 2014-12-19 MED ORDER — PENTAFLUOROPROP-TETRAFLUOROETH EX AERO: 1.0000 "application " | INHALATION_SPRAY | CUTANEOUS | Status: DC | PRN

## 2014-12-19 MED ORDER — HEPARIN SODIUM (PORCINE) 1000 UNIT/ML IJ SOLN
2000.0000 [IU] | Freq: Once | INTRAMUSCULAR | Status: AC
  Administered 2014-12-20: 2000 [IU] via INTRAVENOUS

## 2014-12-19 MED ORDER — HEPARIN SODIUM (PORCINE) 1000 UNIT/ML DIALYSIS: 20.0000 [IU]/kg | INTRAMUSCULAR | Status: DC | PRN

## 2014-12-19 MED ORDER — NEPRO/CARBSTEADY PO LIQD
237.0000 mL | ORAL | Status: DC | PRN
  Filled 2014-12-19: qty 237

## 2014-12-19 MED ORDER — ALTEPLASE 2 MG IJ SOLR
2.0000 mg | Freq: Once | INTRAMUSCULAR | Status: AC | PRN
  Filled 2014-12-19: qty 2

## 2014-12-19 MED ORDER — HEPARIN SODIUM (PORCINE) 1000 UNIT/ML DIALYSIS: 1000.0000 [IU] | INTRAMUSCULAR | Status: DC | PRN

## 2014-12-19 MED ORDER — ALUM & MAG HYDROXIDE-SIMETH 200-200-20 MG/5ML PO SUSP
30.0000 mL | Freq: Once | ORAL | Status: AC
  Administered 2014-12-19: 30 mL via ORAL
  Filled 2014-12-19: qty 30

## 2014-12-19 MED ORDER — SODIUM CHLORIDE 0.9 % IV SOLN: 100.0000 mL | INTRAVENOUS | Status: DC | PRN

## 2014-12-19 NOTE — ED Notes (Signed)
Meal tray ordered 

## 2014-12-19 NOTE — ED Notes (Signed)
He states he is here for dialysis. He states he has been coming here for last 3 weeks for dialysis because he does not have a dialysis center. He denies pain or other complaints. Last dialysis was here friday

## 2014-12-19 NOTE — ED Provider Notes (Signed)
CSN: [id number]     Arrival date & time 12/19/14  71 History   First MD Initiated Contact with Patient 12/19/14 1638     Chief Complaint  Patient presents with  . Vascular Access Problem     (Consider location/radiation/quality/duration/timing/severity/associated sxs/prior Treatment) Patient is a 49 y.o. male presenting with shortness of breath. The history is provided by the patient. No language interpreter was used.  Shortness of Breath Severity:  Moderate Onset quality:  Gradual Duration:  1 day Timing:  Constant Progression:  Worsening Chronicity:  Recurrent Context: activity   Relieved by:  Nothing Worsened by:  Nothing tried Ineffective treatments:  Diuretics Associated symptoms: abdominal pain   Risk factors: hx of PE/DVT   Pt reports he needs dialysis.  Pt last had dialysis on Friday.   Pt reports he moved here from [place].  Pt has not been accepted to a local center yet and continues to come here.    Past Medical History  Diagnosis Date  . Hypertension   . Depression   . Complication of anesthesia     itching, sore throat  . Diabetes mellitus without complication     No history per patient, but remains under history as A1c would not be accurate given on dialysis  . Shortness of breath   . Anxiety   . ESRD (end stage renal disease)     due to HTN per patient, followed at [facility], s/p failed kidney transplant - dialysis Tue, Th, Sat  . Renal insufficiency   . CHF (congestive heart failure)   . Anemia   . Dialysis patient    Past Surgical History  Procedure Laterality Date  . Kidney receipient  2006    failed and started HD in March 2014  . Capd insertion    . Capd removal    . Left heart catheterization with coronary angiogram N/A 09/02/2014    Procedure: LEFT HEART CATHETERIZATION WITH CORONARY ANGIOGRAM;  Surgeon: [name], MD;  Location: [facility] CATH LAB;  Service: Cardiovascular;  Laterality: N/A;   History reviewed. No pertinent family  history. History  Substance Use Topics  . Smoking status: Former Smoker -- 1.00 packs/day for 1 years    Types: Cigarettes  . Smokeless tobacco: Never Used     Comment: quit Jan 2014  . Alcohol Use: No    Review of Systems  Respiratory: Positive for shortness of breath.   Gastrointestinal: Positive for abdominal pain.  All other systems reviewed and are negative.     Allergies  Ferrlecit and Darvocet  Home Medications   Prior to Admission medications   Medication Sig Start Date End Date Taking? Authorizing Provider  atorvastatin (LIPITOR) 40 MG tablet Take 1 tablet (40 mg total) by mouth daily at 6 PM. 09/02/14   Barton Dubois, MD  carvedilol (COREG) 25 MG tablet Take 1 tablet (25 mg total) by mouth 2 (two) times daily with a meal. 10/10/14   [name], DO  cinacalcet (SENSIPAR) 30 MG tablet Take 30 mg by mouth daily.    Historical Provider, MD  ciprofloxacin (CIPRO) 500 MG tablet Take 1 tablet (500 mg total) by mouth daily. 12/16/14   Larene Pickett, PA-C  diclofenac sodium (VOLTAREN) 1 % GEL Apply 2 g topically 4 (four) times daily. 11/14/14   Tresa Garter, MD  docusate sodium (COLACE) 100 MG capsule Take 1 capsule (100 mg total) by mouth 2 (two) times daily. 11/19/14   [name], MD  doxercalciferol (HECTOROL)  4 MCG/2ML injection Inject 1.25 mLs (2.5 mcg total) into the vein Every Tuesday,Thursday,and Saturday with dialysis. 10/10/14   [name], DO  enoxaparin (LOVENOX) 80 MG/0.8ML injection Inject 0.8 mLs (80 mg total) into the skin daily. 12/16/14   Ezequiel Essex, MD  levalbuterol [facility] HFA) 45 MCG/ACT inhaler Inhale 2 puffs into the lungs every 6 (six) hours as needed for wheezing or shortness of breath. 11/19/14   [name], MD  lisinopril (PRINIVIL,ZESTRIL) 20 MG tablet Take 1 tablet (20 mg total) by mouth daily. 12/05/14   Verlee Monte, MD  LORazepam (ATIVAN) 0.5 MG tablet Take 1 tablet (0.5 mg total) by mouth at bedtime as needed for anxiety.  11/19/14   [name], MD  methocarbamol (ROBAXIN) 500 MG tablet Take 1 tablet (500 mg total) by mouth every 8 (eight) hours as needed for muscle spasms. 11/19/14   [name], MD  omeprazole (PRILOSEC) 20 MG capsule Take 20 mg by mouth daily. 07/01/13   Historical Provider, MD  ondansetron (ZOFRAN-ODT) 4 MG disintegrating tablet Take 1 tablet (4 mg total) by mouth every 8 (eight) hours as needed for nausea or vomiting. 12/05/14   Verlee Monte, MD  oxyCODONE (ROXICODONE) 5 MG immediate release tablet Take 1 tablet (5 mg total) by mouth every 6 (six) hours as needed for severe pain. Patient not taking: Reported on 11/28/2014 11/19/14   [name], MD  predniSONE (DELTASONE) 5 MG tablet Take 5 mg by mouth daily with breakfast.    Historical Provider, MD  sevelamer carbonate (RENVELA) 800 MG tablet Take 1,600 mg by mouth 3 (three) times daily with meals. 12/27/13 12/27/14  Historical Provider, MD  warfarin (COUMADIN) 4 MG tablet Take 1 tablet (4 mg total) by mouth daily at 6 PM. 12/05/14   Verlee Monte, MD   BP 119/78 mmHg  Pulse 94  Temp(Src) 98.9 F (37.2 C) (Oral)  Resp 18  SpO2 96% Physical Exam  Constitutional: He is oriented to person, place, and time. He appears well-developed and well-nourished.  HENT:  Head: Normocephalic and atraumatic.  Eyes: EOM are normal. Pupils are equal, round, and reactive to light.  Neck: Normal range of motion.  Cardiovascular: Normal rate and normal heart sounds.   Pulmonary/Chest: Effort normal.  Abdominal: Soft. He exhibits no distension. There is no guarding.  Musculoskeletal: Normal range of motion.  Neurological: He is alert and oriented to person, place, and time.  Skin: Skin is warm.  Psychiatric: He has a normal mood and affect.  Nursing note and vitals reviewed.   ED Course  Procedures (including critical care time) Labs Review Labs Reviewed  I-STAT CHEM 8, ED - Abnormal; Notable for the following:    BUN 39 (*)    Creatinine, Ser  15.50 (*)    Glucose, Bld 116 (*)    Calcium, Ion 1.00 (*)    Hemoglobin 9.5 (*)    HCT 28.0 (*)    All other components within normal limits    Imaging Review No results found.   EKG Interpretation None      MDM Pt reports he called and was told there was an opening at 5 p for dialysis so he came in.  I spoke with Dr. Jonnie Finner who will let Dr. Posey Pronto know pt needs dialysis.  Pt reports he is hungry and thirst.   Pt able to eat and drink without abdominal discomfort.  I doubt abdominal pathology at this time.     Final diagnoses:  Renal failure        Fransico Meadow, PA-C 12/19/14 2254  Orpah Greek, MD 12/20/14 [place]

## 2014-12-20 ENCOUNTER — Encounter (HOSPITAL_COMMUNITY): Payer: Self-pay | Admitting: Emergency Medicine

## 2014-12-20 ENCOUNTER — Emergency Department (HOSPITAL_COMMUNITY)
Admission: EM | Admit: 2014-12-20 | Discharge: 2014-12-20 | Disposition: A | Discharge status: Home / Self Care | Payer: Medicare Other | Attending: Emergency Medicine | Admitting: Emergency Medicine

## 2014-12-20 ENCOUNTER — Ambulatory Visit: Payer: Medicare Other | Admitting: Physical Therapy

## 2014-12-20 DIAGNOSIS — E119 Type 2 diabetes mellitus without complications: Secondary | ICD-10-CM | POA: Diagnosis not present

## 2014-12-20 DIAGNOSIS — Z862 Personal history of diseases of the blood and blood-forming organs and certain disorders involving the immune mechanism: Secondary | ICD-10-CM | POA: Diagnosis not present

## 2014-12-20 DIAGNOSIS — Z791 Long term (current) use of non-steroidal anti-inflammatories (NSAID): Secondary | ICD-10-CM | POA: Diagnosis not present

## 2014-12-20 DIAGNOSIS — Z992 Dependence on renal dialysis: Secondary | ICD-10-CM | POA: Insufficient documentation

## 2014-12-20 DIAGNOSIS — Z7901 Long term (current) use of anticoagulants: Secondary | ICD-10-CM | POA: Insufficient documentation

## 2014-12-20 DIAGNOSIS — Z7952 Long term (current) use of systemic steroids: Secondary | ICD-10-CM | POA: Diagnosis not present

## 2014-12-20 DIAGNOSIS — R112 Nausea with vomiting, unspecified: Secondary | ICD-10-CM | POA: Diagnosis present

## 2014-12-20 DIAGNOSIS — Z9889 Other specified postprocedural states: Secondary | ICD-10-CM | POA: Diagnosis not present

## 2014-12-20 DIAGNOSIS — I509 Heart failure, unspecified: Secondary | ICD-10-CM | POA: Insufficient documentation

## 2014-12-20 DIAGNOSIS — F329 Major depressive disorder, single episode, unspecified: Secondary | ICD-10-CM | POA: Diagnosis not present

## 2014-12-20 DIAGNOSIS — N186 End stage renal disease: Secondary | ICD-10-CM | POA: Diagnosis not present

## 2014-12-20 DIAGNOSIS — Z87891 Personal history of nicotine dependence: Secondary | ICD-10-CM | POA: Insufficient documentation

## 2014-12-20 DIAGNOSIS — Z79899 Other long term (current) drug therapy: Secondary | ICD-10-CM | POA: Diagnosis not present

## 2014-12-20 DIAGNOSIS — R1084 Generalized abdominal pain: Secondary | ICD-10-CM | POA: Insufficient documentation

## 2014-12-20 DIAGNOSIS — I120 Hypertensive chronic kidney disease with stage 5 chronic kidney disease or end stage renal disease: Secondary | ICD-10-CM | POA: Diagnosis not present

## 2014-12-20 DIAGNOSIS — F419 Anxiety disorder, unspecified: Secondary | ICD-10-CM | POA: Diagnosis not present

## 2014-12-20 DIAGNOSIS — R197 Diarrhea, unspecified: Secondary | ICD-10-CM | POA: Insufficient documentation

## 2014-12-20 LAB — RENAL FUNCTION PANEL
ALBUMIN: 2.1 g/dL — AB (ref 3.5–5.0)
Anion gap: 9 (ref 5–15)
BUN: 35 mg/dL — ABNORMAL HIGH (ref 6–20)
CO2: 26 mmol/L (ref 22–32)
Calcium: 7.3 mg/dL — ABNORMAL LOW (ref 8.9–10.3)
Chloride: 102 mmol/L (ref 101–111)
Creatinine, Ser: 16.56 mg/dL — ABNORMAL HIGH (ref 0.61–1.24)
GFR calc non Af Amer: 3 mL/min — ABNORMAL LOW (ref >60)
GFR, EST AFRICAN AMERICAN: 3 mL/min — AB (ref >60)
Glucose, Bld: 105 mg/dL — ABNORMAL HIGH (ref 65–99)
PHOSPHORUS: 5.2 mg/dL — AB (ref 2.5–4.6)
Potassium: 4.2 mmol/L (ref 3.5–5.1)
Sodium: 137 mmol/L (ref 135–145)

## 2014-12-20 LAB — CBC
HCT: 25.6 % — ABNORMAL LOW (ref 39.0–52.0)
Hemoglobin: 7.9 g/dL — ABNORMAL LOW (ref 13.0–17.0)
MCH: 26.1 pg (ref 26.0–34.0)
MCHC: 30.9 g/dL (ref 30.0–36.0)
MCV: 84.5 fL (ref 78.0–100.0)
PLATELETS: 205 10*3/uL (ref 150–400)
RBC: 3.03 MIL/uL — AB (ref 4.22–5.81)
RDW: 14.5 % (ref 11.5–15.5)
WBC: 4.3 10*3/uL (ref 4.0–10.5)

## 2014-12-20 MED ORDER — ONDANSETRON 4 MG PO TBDP
4.0000 mg | ORAL_TABLET | Freq: Once | ORAL | Status: AC
  Administered 2014-12-20: 4 mg via ORAL
  Filled 2014-12-20: qty 1

## 2014-12-20 MED ORDER — GI COCKTAIL ~~LOC~~
30.0000 mL | Freq: Once | ORAL | Status: AC
  Administered 2014-12-20: 30 mL via ORAL
  Filled 2014-12-20: qty 30

## 2014-12-20 MED ORDER — ONDANSETRON HCL 4 MG PO TABS: 4.0000 mg | ORAL_TABLET | Freq: Four times a day (QID) | ORAL | Status: DC

## 2014-12-20 MED ORDER — DICYCLOMINE HCL 20 MG PO TABS: 20.0000 mg | ORAL_TABLET | Freq: Two times a day (BID) | ORAL | Status: DC

## 2014-12-20 NOTE — Discharge Instructions (Signed)
Gastritis, Adult °Gastritis is soreness and swelling (inflammation) of the lining of the stomach. Gastritis can develop as a sudden onset (acute) or long-term (chronic) condition. If gastritis is not treated, it can lead to stomach bleeding and ulcers. °CAUSES  °Gastritis occurs when the stomach lining is weak or damaged. Digestive juices from the stomach then inflame the weakened stomach lining. The stomach lining may be weak or damaged due to viral or bacterial infections. One common bacterial infection is the Helicobacter pylori infection. Gastritis can also result from excessive alcohol consumption, taking certain medicines, or having too much acid in the stomach.  °SYMPTOMS  °In some cases, there are no symptoms. When symptoms are present, they may include: °· Pain or a burning sensation in the upper abdomen. °· Nausea. °· Vomiting. °· An uncomfortable feeling of fullness after eating. °DIAGNOSIS  °Your caregiver may suspect you have gastritis based on your symptoms and a physical exam. To determine the cause of your gastritis, your caregiver may perform the following: °· Blood or stool tests to check for the H pylori bacterium. °· Gastroscopy. A thin, flexible tube (endoscope) is passed down the esophagus and into the stomach. The endoscope has a light and camera on the end. Your caregiver uses the endoscope to view the inside of the stomach. °· Taking a tissue sample (biopsy) from the stomach to examine under a microscope. °TREATMENT  °Depending on the cause of your gastritis, medicines may be prescribed. If you have a bacterial infection, such as an H pylori infection, antibiotics may be given. If your gastritis is caused by too much acid in the stomach, H2 blockers or antacids may be given. Your caregiver may recommend that you stop taking aspirin, ibuprofen, or other nonsteroidal anti-inflammatory drugs (NSAIDs). °HOME CARE INSTRUCTIONS °· Only take over-the-counter or prescription medicines as directed by  your caregiver. °· If you were given antibiotic medicines, take them as directed. Finish them even if you start to feel better. °· Drink enough fluids to keep your urine clear or pale yellow. °· Avoid foods and drinks that make your symptoms worse, such as: °¨ Caffeine or alcoholic drinks. °¨ Chocolate. °¨ Peppermint or mint flavorings. °¨ Garlic and onions. °¨ Spicy foods. °¨ Citrus fruits, such as oranges, lemons, or limes. °¨ Tomato-based foods such as sauce, chili, salsa, and pizza. °¨ Fried and fatty foods. °· Eat small, frequent meals instead of large meals. °SEEK IMMEDIATE MEDICAL CARE IF:  °· You have black or dark red stools. °· You vomit blood or material that looks like coffee grounds. °· You are unable to keep fluids down. °· Your abdominal pain gets worse. °· You have a fever. °· You do not feel better after 1 week. °· You have any other questions or concerns. °MAKE SURE YOU: °· Understand these instructions. °· Will watch your condition. °· Will get help right away if you are not doing well or get worse. °Document Released: 07/02/2001 Document Revised: 01/07/2012 Document Reviewed: 08/21/2011 °ExitCare® Patient Information ©2015 [employer]. This information is not intended to replace advice given to you by your health care provider. Make sure you discuss any questions you have with your health care provider. ° °

## 2014-12-20 NOTE — ED Provider Notes (Signed)
CSN: [id number]     Arrival date & time 12/20/14  0430 History   First MD Initiated Contact with Patient 12/20/14 0507     Chief Complaint  Patient presents with  . Abdominal Pain  . Nausea     (Consider location/radiation/quality/duration/timing/severity/associated sxs/prior Treatment) HPI Comments: Pt is a 49 y.o. male with Pmhx as above who presents with generalized crampy abdominal pain, nausea, vomiting and diarrhea since yesterday.  Denies hematemesis or bloody stools.  He has been on antibiotics recently, but denies recent fevers.  He reports his brother has had some diarrhea at home, but no vomiting.  Patient initially presented today for shortness of breath and abdominal pain was in need of dialysis which she got he was then discharged back to the emergency department lobby, and checked back in to the ED.  Upon checking in, patient requested Maalox and something to calm his stomach, stated he didn't want to see a doctor.   Past Medical History  Diagnosis Date  . Hypertension   . Depression   . Complication of anesthesia     itching, sore throat  . Diabetes mellitus without complication     No history per patient, but remains under history as A1c would not be accurate given on dialysis  . Shortness of breath   . Anxiety   . ESRD (end stage renal disease)     due to HTN per patient, followed at [facility], s/p failed kidney transplant - dialysis Tue, Th, Sat  . Renal insufficiency   . CHF (congestive heart failure)   . Anemia   . Dialysis patient    Past Surgical History  Procedure Laterality Date  . Kidney receipient  2006    failed and started HD in March 2014  . Capd insertion    . Capd removal    . Left heart catheterization with coronary angiogram N/A 09/02/2014    Procedure: LEFT HEART CATHETERIZATION WITH CORONARY ANGIOGRAM;  Surgeon: [name], MD;  Location: [facility] CATH LAB;  Service: Cardiovascular;  Laterality: N/A;   History reviewed. No pertinent family  history. History  Substance Use Topics  . Smoking status: Former Smoker -- 1.00 packs/day for 1 years    Types: Cigarettes  . Smokeless tobacco: Never Used     Comment: quit Jan 2014  . Alcohol Use: No    Review of Systems  Constitutional: Negative for fever, activity change, appetite change and fatigue.  HENT: Negative for congestion, facial swelling, rhinorrhea and trouble swallowing.   Eyes: Negative for photophobia and pain.  Respiratory: Negative for cough, chest tightness and shortness of breath.   Cardiovascular: Negative for chest pain and leg swelling.  Gastrointestinal: Negative for nausea, vomiting, abdominal pain, diarrhea and constipation.  Endocrine: Negative for polydipsia and polyuria.  Genitourinary: Negative for dysuria, urgency, decreased urine volume and difficulty urinating.  Musculoskeletal: Negative for back pain and gait problem.  Skin: Negative for color change, rash and wound.  Allergic/Immunologic: Negative for immunocompromised state.  Neurological: Negative for dizziness, facial asymmetry, speech difficulty, weakness, numbness and headaches.  Psychiatric/Behavioral: Negative for confusion, decreased concentration and agitation.      Allergies  Ferrlecit and Darvocet  Home Medications   Prior to Admission medications   Medication Sig Start Date End Date Taking? Authorizing Provider  atorvastatin (LIPITOR) 40 MG tablet Take 1 tablet (40 mg total) by mouth daily at 6 PM. 09/02/14  Yes Barton Dubois, MD  carvedilol (COREG) 25 MG tablet Take 1 tablet (25 mg total)  by mouth 2 (two) times daily with a meal. 10/10/14  Yes [name], DO  cinacalcet (SENSIPAR) 30 MG tablet Take 30 mg by mouth daily.   Yes Historical Provider, MD  diclofenac sodium (VOLTAREN) 1 % GEL Apply 2 g topically 4 (four) times daily. 11/14/14  Yes Tresa Garter, MD  docusate sodium (COLACE) 100 MG capsule Take 1 capsule (100 mg total) by mouth 2 (two) times daily. 11/19/14  Yes  [name], MD  doxercalciferol (HECTOROL) 4 MCG/2ML injection Inject 1.25 mLs (2.5 mcg total) into the vein Every Tuesday,Thursday,and Saturday with dialysis. 10/10/14  Yes [name], DO  levalbuterol (XOPENEX HFA) 45 MCG/ACT inhaler Inhale 2 puffs into the lungs every 6 (six) hours as needed for wheezing or shortness of breath. 11/19/14  Yes [name], MD  lisinopril (PRINIVIL,ZESTRIL) 20 MG tablet Take 1 tablet (20 mg total) by mouth daily. 12/05/14  Yes Verlee Monte, MD  LORazepam (ATIVAN) 0.5 MG tablet Take 1 tablet (0.5 mg total) by mouth at bedtime as needed for anxiety. 11/19/14  Yes [name], MD  methocarbamol (ROBAXIN) 500 MG tablet Take 1 tablet (500 mg total) by mouth every 8 (eight) hours as needed for muscle spasms. 11/19/14  Yes [name], MD  omeprazole (PRILOSEC) 20 MG capsule Take 20 mg by mouth daily. 07/01/13  Yes Historical Provider, MD  ondansetron (ZOFRAN-ODT) 4 MG disintegrating tablet Take 1 tablet (4 mg total) by mouth every 8 (eight) hours as needed for nausea or vomiting. 12/05/14  Yes Verlee Monte, MD  predniSONE (DELTASONE) 5 MG tablet Take 5 mg by mouth daily with breakfast.   Yes Historical Provider, MD  sevelamer carbonate (RENVELA) 800 MG tablet Take 1,600 mg by mouth 3 (three) times daily with meals. 12/27/13 12/27/14 Yes Historical Provider, MD  warfarin (COUMADIN) 4 MG tablet Take 1 tablet (4 mg total) by mouth daily at 6 PM. 12/05/14  Yes Verlee Monte, MD  ciprofloxacin (CIPRO) 500 MG tablet Take 1 tablet (500 mg total) by mouth daily. Patient not taking: Reported on 12/20/2014 12/16/14   Larene Pickett, PA-C  dicyclomine (BENTYL) 20 MG tablet Take 1 tablet (20 mg total) by mouth 2 (two) times daily. 12/20/14   Ernestina Patches, MD  enoxaparin (LOVENOX) 80 MG/0.8ML injection Inject 0.8 mLs (80 mg total) into the skin daily. Patient not taking: Reported on 12/20/2014 12/16/14   Ezequiel Essex, MD  ondansetron (ZOFRAN) 4 MG tablet Take 1 tablet (4 mg total)  by mouth every 6 (six) hours. 12/20/14   Ernestina Patches, MD   BP 130/88 mmHg  Pulse 82  Temp(Src) 97.5 F (36.4 C) (Oral)  Resp 18  SpO2 100% Physical Exam  Constitutional: He is oriented to person, place, and time. He appears well-developed and well-nourished. No distress.  HENT:  Head: Normocephalic and atraumatic.  Mouth/Throat: No oropharyngeal exudate.  Eyes: Pupils are equal, round, and reactive to light.  Neck: Normal range of motion. Neck supple.  Cardiovascular: Normal rate, regular rhythm and normal heart sounds.  Exam reveals no gallop and no friction rub.   No murmur heard. Pulmonary/Chest: Effort normal and breath sounds normal. No respiratory distress. He has no wheezes. He has no rales.  Abdominal: Soft. Bowel sounds are normal. He exhibits no distension and no mass. There is tenderness. There is no rebound and no guarding.  Musculoskeletal: Normal range of motion. He exhibits no edema or tenderness.  Neurological: He is alert and oriented to person, place,  and time.  Skin: Skin is warm and dry.  Psychiatric: He has a normal mood and affect.    ED Course  Procedures (including critical care time) Labs Review Labs Reviewed - No data to display  Imaging Review No results found.   EKG Interpretation None          Ref Range 1d ago (12/19/14) 1d ago (12/19/14) 4d ago (12/16/14)    WBC 4.0 - 10.5 K/uL 4.3  7.5    RBC 4.22 - 5.81 MIL/uL 3.03 (L)  3.10 (L)    Hemoglobin 13.0 - 17.0 g/dL 7.9 (L) 9.5 (L) 8.5 (L)    HCT 39.0 - 52.0 % 25.6 (L) 28.0 (L) 26.4 (L)    MCV 78.0 - 100.0 fL 84.5  85.2    MCH 26.0 - 34.0 pg 26.1  27.4    MCHC 30.0 - 36.0 g/dL 30.9  32.2    RDW 11.5 - 15.5 % 14.5  14.7    Platelets 150 - 400 K/uL 205  180   Resulting Agency  [facility]          Ref Range 1d ago (12/19/14) 4d ago (12/16/14) 4d ago (12/16/14)     Sodium 135 - 145 mmol/L 140 138     Potassium 3.5 - 5.1 mmol/L 4.8 5.1     Chloride 101 - 111  mmol/L 102 99 (L)     BUN 6 - 20 mg/dL 39 (H) 38 (H)     Creatinine, Ser 0.61 - 1.24 mg/dL 15.50 (H) 15.29 (H)CM     Glucose, Bld 65 - 99 mg/dL 116 (H) 77     Calcium, Ion 1.12 - 1.23 mmol/L 1.00 (L)      TCO2 0 - 100 mmol/L 24      Hemoglobin 13.0 - 17.0 g/dL 9.5 (L)  8.5 (L)    HCT 39.0 - 52.0 % 28.0 (L)  26.4 (L)   Resulting Agency  [facility]            MDM   Final diagnoses:  Nausea vomiting and diarrhea    Pt is a 49 y.o. male with Pmhx as above who presents with generalized crampy abdominal pain, nausea, vomiting and diarrhea since yesterday.  Denies hematemesis or bloody stools.  He has been on antibiotics recently, but denies recent fevers.  He reports his brother has had some diarrhea at home, but no vomiting.  Patient initially presented today for shortness of breath and abdominal pain was in need of dialysis which she got he was then discharged back to the emergency department lobby, and checked back in to the ED.  Upon checking in, patient requested Maalox and something to calm his stomach, stated he didn't want to see a doctor.  On physical exam, vital signs are stable.  Patient is in no acute distress, he falls asleep several times while talking to me, those easily to arouse.  He has generalized abdominal tenderness without rebound or guarding.  Patient given GI cocktail, Zofran, will reexamine.  He is was seen in the ED earlier today for abdominal pain and request for dialysis and  had a unremarkable CBC, i-STAT Chem-8 as above, with chronic renal disease.  I do not see need for repeat lab draw.  Exam is not consistent with acute surgical cause of symptoms.   Patient feeling improved after GI cocktail and Zofran.  He has not had vomiting or diarrhea in the ED.  I suspect a viral gastroenteritis.  He will  be discharged home with Bentyl and Zofran.   [name] evaluation in the Emergency Department is complete. It has been determined that no acute conditions requiring  further emergency intervention are present at this time. The patient/guardian have been advised of the diagnosis and plan. We have discussed signs and symptoms that warrant return to the ED, such as changes or worsening in symptoms, worsening pain, fever, inability to tolerate liquids      Ernestina Patches, MD 12/20/14 [phone]

## 2014-12-20 NOTE — ED Notes (Signed)
Pt states that he had just finished dialysis, when he started having abdominal pain and nausea. Pt states that he was having these symptoms before dialysis and they gave him something minty, which he reports helped his symptoms.

## 2014-12-22 ENCOUNTER — Ambulatory Visit: Payer: Medicare Other | Attending: Internal Medicine | Admitting: Physical Therapy

## 2014-12-23 ENCOUNTER — Other Ambulatory Visit: Payer: Self-pay

## 2014-12-23 ENCOUNTER — Non-Acute Institutional Stay (HOSPITAL_COMMUNITY)
Admission: EM | Admit: 2014-12-23 | Discharge: 2014-12-23 | Disposition: A | Discharge status: Home / Self Care | Payer: Medicare Other | Attending: Emergency Medicine | Admitting: Emergency Medicine

## 2014-12-23 ENCOUNTER — Encounter (HOSPITAL_COMMUNITY): Payer: Self-pay | Admitting: *Deleted

## 2014-12-23 DIAGNOSIS — Z992 Dependence on renal dialysis: Secondary | ICD-10-CM

## 2014-12-23 DIAGNOSIS — Z7901 Long term (current) use of anticoagulants: Secondary | ICD-10-CM | POA: Insufficient documentation

## 2014-12-23 DIAGNOSIS — R0602 Shortness of breath: Secondary | ICD-10-CM | POA: Insufficient documentation

## 2014-12-23 DIAGNOSIS — Z87891 Personal history of nicotine dependence: Secondary | ICD-10-CM | POA: Insufficient documentation

## 2014-12-23 DIAGNOSIS — I509 Heart failure, unspecified: Secondary | ICD-10-CM | POA: Insufficient documentation

## 2014-12-23 DIAGNOSIS — Z79899 Other long term (current) drug therapy: Secondary | ICD-10-CM | POA: Insufficient documentation

## 2014-12-23 DIAGNOSIS — F329 Major depressive disorder, single episode, unspecified: Secondary | ICD-10-CM | POA: Insufficient documentation

## 2014-12-23 DIAGNOSIS — E119 Type 2 diabetes mellitus without complications: Secondary | ICD-10-CM | POA: Diagnosis not present

## 2014-12-23 DIAGNOSIS — Z888 Allergy status to other drugs, medicaments and biological substances status: Secondary | ICD-10-CM | POA: Diagnosis not present

## 2014-12-23 DIAGNOSIS — Z885 Allergy status to narcotic agent status: Secondary | ICD-10-CM | POA: Insufficient documentation

## 2014-12-23 DIAGNOSIS — N186 End stage renal disease: Secondary | ICD-10-CM | POA: Diagnosis not present

## 2014-12-23 DIAGNOSIS — I120 Hypertensive chronic kidney disease with stage 5 chronic kidney disease or end stage renal disease: Secondary | ICD-10-CM | POA: Insufficient documentation

## 2014-12-23 DIAGNOSIS — D649 Anemia, unspecified: Secondary | ICD-10-CM | POA: Diagnosis not present

## 2014-12-23 LAB — I-STAT CHEM 8, ED
BUN: 37 mg/dL — AB (ref 6–20)
CALCIUM ION: 0.99 mmol/L — AB (ref 1.12–1.23)
Chloride: 101 mmol/L (ref 101–111)
Creatinine, Ser: 15.6 mg/dL — ABNORMAL HIGH (ref 0.61–1.24)
Glucose, Bld: 98 mg/dL (ref 65–99)
HCT: 28 % — ABNORMAL LOW (ref 39.0–52.0)
Hemoglobin: 9.5 g/dL — ABNORMAL LOW (ref 13.0–17.0)
POTASSIUM: 4.5 mmol/L (ref 3.5–5.1)
Sodium: 135 mmol/L (ref 135–145)
TCO2: 20 mmol/L (ref 0–100)

## 2014-12-23 MED ORDER — SODIUM CHLORIDE 0.9 % IV SOLN
100.0000 mL | INTRAVENOUS | Status: DC | PRN
Start: 2014-12-23 — End: 2014-12-23

## 2014-12-23 MED ORDER — LIDOCAINE HCL (PF) 1 % IJ SOLN: 5.0000 mL | INTRAMUSCULAR | Status: DC | PRN

## 2014-12-23 MED ORDER — HEPARIN SODIUM (PORCINE) 1000 UNIT/ML DIALYSIS: 1000.0000 [IU] | INTRAMUSCULAR | Status: DC | PRN

## 2014-12-23 MED ORDER — LIDOCAINE-PRILOCAINE 2.5-2.5 % EX CREA: 1.0000 "application " | TOPICAL_CREAM | CUTANEOUS | Status: DC | PRN

## 2014-12-23 MED ORDER — ALTEPLASE 2 MG IJ SOLR: 2.0000 mg | Freq: Once | INTRAMUSCULAR | Status: DC | PRN

## 2014-12-23 MED ORDER — SODIUM CHLORIDE 0.9 % IV SOLN: 100.0000 mL | INTRAVENOUS | Status: DC | PRN

## 2014-12-23 MED ORDER — PENTAFLUOROPROP-TETRAFLUOROETH EX AERO: 1.0000 "application " | INHALATION_SPRAY | CUTANEOUS | Status: DC | PRN

## 2014-12-23 MED ORDER — HEPARIN SODIUM (PORCINE) 1000 UNIT/ML DIALYSIS: 3500.0000 [IU] | INTRAMUSCULAR | Status: DC | PRN

## 2014-12-23 NOTE — Progress Notes (Signed)
Pt received HD with no complications. 2L fluid removed. Alert, vss, no c/o. Discharge to home.

## 2014-12-23 NOTE — Procedures (Signed)
Patient seen on Hemodialysis---without a home HD unit. QB 450, UF goal 3L Treatment adjusted as needed.  Elmarie Shiley MD [facility]. Office # [phone] Pager # [phone] 11:13 AM

## 2014-12-23 NOTE — ED Provider Notes (Signed)
CSN: [id number]     Arrival date & time 12/23/14  2778 History   First MD Initiated Contact with Patient 12/23/14 [phone]     Chief Complaint  Patient presents with  . Vascular Access Problem     (Consider location/radiation/quality/duration/timing/severity/associated sxs/prior Treatment) HPI Comments: The patient presents with request for hemodialysis.  He has recently moved from [place] and does not have a dialysis center set up in [place].  He states he is feeling well and has not felt this good in some time.  He denies chest pain, shortness of breath.  He recently was seen for abdominal pain, nausea, vomiting and diarrhea and states these have resolved.  He does not feel he is fluid overloaded.  His last dialysis was on 12/19/2014, he was not dialyzed 2 days ago, as he was told there was no acute need.   Past Medical History  Diagnosis Date  . Hypertension   . Depression   . Complication of anesthesia     itching, sore throat  . Diabetes mellitus without complication     No history per patient, but remains under history as A1c would not be accurate given on dialysis  . Shortness of breath   . Anxiety   . ESRD (end stage renal disease)     due to HTN per patient, followed at [facility], s/p failed kidney transplant - dialysis Tue, Th, Sat  . Renal insufficiency   . CHF (congestive heart failure)   . Anemia   . Dialysis patient    Past Surgical History  Procedure Laterality Date  . Kidney receipient  2006    failed and started HD in March 2014  . Capd insertion    . Capd removal    . Left heart catheterization with coronary angiogram N/A 09/02/2014    Procedure: LEFT HEART CATHETERIZATION WITH CORONARY ANGIOGRAM;  Surgeon: [name], MD;  Location: [facility] CATH LAB;  Service: Cardiovascular;  Laterality: N/A;   History reviewed. No pertinent family history. History  Substance Use Topics  . Smoking status: Former Smoker -- 1.00 packs/day for 1 years    Types: Cigarettes   . Smokeless tobacco: Never Used     Comment: quit Jan 2014  . Alcohol Use: No    Review of Systems  Constitutional: Negative for fever, activity change, appetite change and fatigue.  HENT: Negative for congestion, facial swelling, rhinorrhea and trouble swallowing.   Eyes: Negative for photophobia and pain.  Respiratory: Negative for cough, chest tightness and shortness of breath.   Cardiovascular: Negative for chest pain and leg swelling.  Gastrointestinal: Negative for nausea, vomiting, abdominal pain, diarrhea and constipation.  Endocrine: Negative for polydipsia and polyuria.  Genitourinary: Negative for dysuria, urgency, decreased urine volume and difficulty urinating.  Musculoskeletal: Negative for back pain and gait problem.  Skin: Negative for color change, rash and wound.  Allergic/Immunologic: Negative for immunocompromised state.  Neurological: Negative for dizziness, facial asymmetry, speech difficulty, weakness, numbness and headaches.  Psychiatric/Behavioral: Negative for confusion, decreased concentration and agitation.      Allergies  Ferrlecit and Darvocet  Home Medications   Prior to Admission medications   Medication Sig Start Date End Date Taking? Authorizing Provider  atorvastatin (LIPITOR) 40 MG tablet Take 1 tablet (40 mg total) by mouth daily at 6 PM. 09/02/14   Barton Dubois, MD  carvedilol (COREG) 25 MG tablet Take 1 tablet (25 mg total) by mouth 2 (two) times daily with a meal. 10/10/14   [name], DO  cinacalcet (SENSIPAR) 30 MG tablet Take 30 mg by mouth daily.    Historical Provider, MD  ciprofloxacin (CIPRO) 500 MG tablet Take 1 tablet (500 mg total) by mouth daily. Patient not taking: Reported on 12/20/2014 12/16/14   Larene Pickett, PA-C  diclofenac sodium (VOLTAREN) 1 % GEL Apply 2 g topically 4 (four) times daily. 11/14/14   Tresa Garter, MD  dicyclomine (BENTYL) 20 MG tablet Take 1 tablet (20 mg total) by mouth 2 (two) times daily.  12/20/14   Ernestina Patches, MD  docusate sodium (COLACE) 100 MG capsule Take 1 capsule (100 mg total) by mouth 2 (two) times daily. 11/19/14   [name], MD  doxercalciferol (HECTOROL) 4 MCG/2ML injection Inject 1.25 mLs (2.5 mcg total) into the vein Every Tuesday,Thursday,and Saturday with dialysis. 10/10/14   [name], DO  enoxaparin (LOVENOX) 80 MG/0.8ML injection Inject 0.8 mLs (80 mg total) into the skin daily. Patient not taking: Reported on 12/20/2014 12/16/14   Ezequiel Essex, MD  levalbuterol [facility] HFA) 45 MCG/ACT inhaler Inhale 2 puffs into the lungs every 6 (six) hours as needed for wheezing or shortness of breath. 11/19/14   [name], MD  lisinopril (PRINIVIL,ZESTRIL) 20 MG tablet Take 1 tablet (20 mg total) by mouth daily. 12/05/14   Verlee Monte, MD  LORazepam (ATIVAN) 0.5 MG tablet Take 1 tablet (0.5 mg total) by mouth at bedtime as needed for anxiety. 11/19/14   [name], MD  methocarbamol (ROBAXIN) 500 MG tablet Take 1 tablet (500 mg total) by mouth every 8 (eight) hours as needed for muscle spasms. 11/19/14   [name], MD  omeprazole (PRILOSEC) 20 MG capsule Take 20 mg by mouth daily. 07/01/13   Historical Provider, MD  ondansetron (ZOFRAN) 4 MG tablet Take 1 tablet (4 mg total) by mouth every 6 (six) hours. 12/20/14   Ernestina Patches, MD  ondansetron (ZOFRAN-ODT) 4 MG disintegrating tablet Take 1 tablet (4 mg total) by mouth every 8 (eight) hours as needed for nausea or vomiting. 12/05/14   Verlee Monte, MD  predniSONE (DELTASONE) 5 MG tablet Take 5 mg by mouth daily with breakfast.    Historical Provider, MD  sevelamer carbonate (RENVELA) 800 MG tablet Take 1,600 mg by mouth 3 (three) times daily with meals. 12/27/13 12/27/14  Historical Provider, MD  warfarin (COUMADIN) 4 MG tablet Take 1 tablet (4 mg total) by mouth daily at 6 PM. 12/05/14   Mutaz Elmahi, MD   BP 158/97 mmHg  Pulse 82  Temp(Src) 97.8 F (36.6 C) (Oral)  Resp 16  Wt 171 lb 4.8 oz (77.7 kg)   SpO2 96% Physical Exam  Constitutional: He is oriented to person, place, and time. He appears well-developed and well-nourished. No distress.  HENT:  Head: Normocephalic and atraumatic.  Mouth/Throat: No oropharyngeal exudate.  Eyes: Pupils are equal, round, and reactive to light.  Neck: Normal range of motion. Neck supple.  Cardiovascular: Normal rate, regular rhythm and normal heart sounds.  Exam reveals no gallop and no friction rub.   No murmur heard. Pulmonary/Chest: Effort normal and breath sounds normal. No respiratory distress. He has no wheezes. He has no rales.  Abdominal: Soft. Bowel sounds are normal. He exhibits no distension and no mass. There is no tenderness. There is no rebound and no guarding.  Musculoskeletal: Normal range of motion. He exhibits no edema or tenderness.  Neurological: He is alert and oriented to person, place, and time.  Skin: Skin is warm and  dry.  Psychiatric: He has a normal mood and affect.    ED Course  Procedures (including critical care time) Labs Review Labs Reviewed  I-STAT CHEM 8, ED - Abnormal; Notable for the following:    BUN 37 (*)    Creatinine, Ser 15.60 (*)    Calcium, Ion 0.99 (*)    Hemoglobin 9.5 (*)    HCT 28.0 (*)    All other components within normal limits    Imaging Review No results found.   EKG Interpretation   Date/Time:  Friday December 23 2014 07:42:02 EDT Ventricular Rate:  82 PR Interval:  145 QRS Duration: 86 QT Interval:  434 QTC Calculation: 507 R Axis:   34 Text Interpretation:  Pacemaker spikes or artifacts Sinus rhythm Probable  anteroseptal infarct, old Repol abnrm suggests ischemia, anterolateral  Prolonged QT interval Confirmed by DOCHERTY  MD, Loaza ([id number]) on 12/23/2014  7:46:05 AM      MDM   Final diagnoses:  Encounter for dialysis    Pt is a 49 y.o. male with Pmhx as above who presents with request for dialysis.  He states he feels well, specifically denies chest pain, shortness of  breath, lower extremity swelling, abdominal pain, nausea, vomiting or diarrhea.  EKG with diffuse T-wave inversions, however, again, denies cardiopulmonary symptoms. Potassium is 4.5, CO2 is 20.  Creatinine 15.6. Will speak to renal about HD today.    The patient will be taken for hemodialysis.  I believe he can be discharged home after dialysis.   Ernestina Patches, MD 12/23/14 1550

## 2014-12-23 NOTE — ED Notes (Signed)
Pt to ED for dialysis; last treatment on Wednesday. Denies any complaints at this time. Pt with breakfast and ginger ale at bedside

## 2014-12-23 NOTE — ED Notes (Signed)
Pt went to car to get his phone.

## 2014-12-23 NOTE — ED Notes (Signed)
Pt is refusing EKG. Dr. Tawnya Crook notified.

## 2014-12-26 ENCOUNTER — Emergency Department (HOSPITAL_COMMUNITY)
Admission: EM | Admit: 2014-12-26 | Discharge: 2014-12-27 | Disposition: A | Discharge status: Home / Self Care | Payer: Medicare Other | Attending: Emergency Medicine | Admitting: Emergency Medicine

## 2014-12-26 ENCOUNTER — Encounter (HOSPITAL_COMMUNITY): Payer: Self-pay | Admitting: Emergency Medicine

## 2014-12-26 DIAGNOSIS — Z862 Personal history of diseases of the blood and blood-forming organs and certain disorders involving the immune mechanism: Secondary | ICD-10-CM | POA: Insufficient documentation

## 2014-12-26 DIAGNOSIS — Z7952 Long term (current) use of systemic steroids: Secondary | ICD-10-CM | POA: Insufficient documentation

## 2014-12-26 DIAGNOSIS — E119 Type 2 diabetes mellitus without complications: Secondary | ICD-10-CM | POA: Insufficient documentation

## 2014-12-26 DIAGNOSIS — Z79899 Other long term (current) drug therapy: Secondary | ICD-10-CM | POA: Diagnosis not present

## 2014-12-26 DIAGNOSIS — F419 Anxiety disorder, unspecified: Secondary | ICD-10-CM | POA: Diagnosis not present

## 2014-12-26 DIAGNOSIS — I509 Heart failure, unspecified: Secondary | ICD-10-CM | POA: Insufficient documentation

## 2014-12-26 DIAGNOSIS — Z87891 Personal history of nicotine dependence: Secondary | ICD-10-CM | POA: Diagnosis not present

## 2014-12-26 DIAGNOSIS — N186 End stage renal disease: Secondary | ICD-10-CM | POA: Insufficient documentation

## 2014-12-26 DIAGNOSIS — I120 Hypertensive chronic kidney disease with stage 5 chronic kidney disease or end stage renal disease: Secondary | ICD-10-CM | POA: Diagnosis not present

## 2014-12-26 DIAGNOSIS — Z992 Dependence on renal dialysis: Secondary | ICD-10-CM | POA: Insufficient documentation

## 2014-12-26 DIAGNOSIS — Z7901 Long term (current) use of anticoagulants: Secondary | ICD-10-CM | POA: Diagnosis not present

## 2014-12-26 LAB — BASIC METABOLIC PANEL
Anion gap: 12 (ref 5–15)
BUN: 50 mg/dL — ABNORMAL HIGH (ref 6–20)
CALCIUM: 7.8 mg/dL — AB (ref 8.9–10.3)
CHLORIDE: 100 mmol/L — AB (ref 101–111)
CO2: 24 mmol/L (ref 22–32)
Creatinine, Ser: 15.65 mg/dL — ABNORMAL HIGH (ref 0.61–1.24)
GFR calc Af Amer: 4 mL/min — ABNORMAL LOW (ref >60)
GFR calc non Af Amer: 3 mL/min — ABNORMAL LOW (ref >60)
Glucose, Bld: 72 mg/dL (ref 65–99)
POTASSIUM: 4.9 mmol/L (ref 3.5–5.1)
SODIUM: 136 mmol/L (ref 135–145)

## 2014-12-26 NOTE — Discharge Instructions (Signed)
Follow-up tomorrow for dialysis.

## 2014-12-26 NOTE — ED Provider Notes (Signed)
CSN: [id number]     Arrival date & time 12/26/14  1944 History   First MD Initiated Contact with Patient 12/26/14 2213     Chief Complaint  Patient presents with  . Vascular Access Problem     (Consider location/radiation/quality/duration/timing/severity/associated sxs/prior Treatment) The history is provided by the patient.   patient presents for dialysis. He does not have a home dialysis center and comes to the ER for dialysis. Last dialyzed 3 days ago. He is without complaints. States that he has not felt this good in a while. No chest pain. No Trouble breathing. States he is up about 3 pounds. He has a catheter for peritoneal dialysis but states he has not been able to set up in [place].  Past Medical History  Diagnosis Date  . Hypertension   . Depression   . Complication of anesthesia     itching, sore throat  . Diabetes mellitus without complication     No history per patient, but remains under history as A1c would not be accurate given on dialysis  . Shortness of breath   . Anxiety   . ESRD (end stage renal disease)     due to HTN per patient, followed at [facility], s/p failed kidney transplant - dialysis Tue, Th, Sat  . Renal insufficiency   . CHF (congestive heart failure)   . Anemia   . Dialysis patient    Past Surgical History  Procedure Laterality Date  . Kidney receipient  2006    failed and started HD in March 2014  . Capd insertion    . Capd removal    . Left heart catheterization with coronary angiogram N/A 09/02/2014    Procedure: LEFT HEART CATHETERIZATION WITH CORONARY ANGIOGRAM;  Surgeon: [name], MD;  Location: [facility] CATH LAB;  Service: Cardiovascular;  Laterality: N/A;   No family history on file. History  Substance Use Topics  . Smoking status: Former Smoker -- 1.00 packs/day for 1 years    Types: Cigarettes  . Smokeless tobacco: Never Used     Comment: quit Jan 2014  . Alcohol Use: No    Review of Systems  Constitutional: Negative for  appetite change.  Respiratory: Negative for shortness of breath.   Cardiovascular: Negative for chest pain.  Gastrointestinal: Negative for abdominal pain.  Musculoskeletal: Negative for back pain.  Skin: Negative for wound.      Allergies  Ferrlecit and Darvocet  Home Medications   Prior to Admission medications   Medication Sig Start Date End Date Taking? Authorizing Provider  atorvastatin (LIPITOR) 40 MG tablet Take 1 tablet (40 mg total) by mouth daily at 6 PM. 09/02/14   Barton Dubois, MD  carvedilol (COREG) 25 MG tablet Take 1 tablet (25 mg total) by mouth 2 (two) times daily with a meal. 10/10/14   [name], DO  cinacalcet (SENSIPAR) 30 MG tablet Take 30 mg by mouth daily.    Historical Provider, MD  ciprofloxacin (CIPRO) 500 MG tablet Take 1 tablet (500 mg total) by mouth daily. Patient not taking: Reported on 12/20/2014 12/16/14   Larene Pickett, PA-C  diclofenac sodium (VOLTAREN) 1 % GEL Apply 2 g topically 4 (four) times daily. 11/14/14   Tresa Garter, MD  dicyclomine (BENTYL) 20 MG tablet Take 1 tablet (20 mg total) by mouth 2 (two) times daily. 12/20/14   Ernestina Patches, MD  docusate sodium (COLACE) 100 MG capsule Take 1 capsule (100 mg total) by mouth 2 (two) times daily. 11/19/14  [name], MD  doxercalciferol (HECTOROL) 4 MCG/2ML injection Inject 1.25 mLs (2.5 mcg total) into the vein Every Tuesday,Thursday,and Saturday with dialysis. 10/10/14   [name], DO  enoxaparin (LOVENOX) 80 MG/0.8ML injection Inject 0.8 mLs (80 mg total) into the skin daily. Patient not taking: Reported on 12/20/2014 12/16/14   Ezequiel Essex, MD  levalbuterol [facility] HFA) 45 MCG/ACT inhaler Inhale 2 puffs into the lungs every 6 (six) hours as needed for wheezing or shortness of breath. 11/19/14   [name], MD  lisinopril (PRINIVIL,ZESTRIL) 20 MG tablet Take 1 tablet (20 mg total) by mouth daily. 12/05/14   Verlee Monte, MD  LORazepam (ATIVAN) 0.5 MG tablet Take 1 tablet  (0.5 mg total) by mouth at bedtime as needed for anxiety. 11/19/14   [name], MD  methocarbamol (ROBAXIN) 500 MG tablet Take 1 tablet (500 mg total) by mouth every 8 (eight) hours as needed for muscle spasms. 11/19/14   [name], MD  omeprazole (PRILOSEC) 20 MG capsule Take 20 mg by mouth daily. 07/01/13   Historical Provider, MD  ondansetron (ZOFRAN) 4 MG tablet Take 1 tablet (4 mg total) by mouth every 6 (six) hours. 12/20/14   Ernestina Patches, MD  ondansetron (ZOFRAN-ODT) 4 MG disintegrating tablet Take 1 tablet (4 mg total) by mouth every 8 (eight) hours as needed for nausea or vomiting. 12/05/14   Verlee Monte, MD  predniSONE (DELTASONE) 5 MG tablet Take 5 mg by mouth daily with breakfast.    Historical Provider, MD  sevelamer carbonate (RENVELA) 800 MG tablet Take 1,600 mg by mouth 3 (three) times daily with meals. 12/27/13 12/27/14  Historical Provider, MD  warfarin (COUMADIN) 4 MG tablet Take 1 tablet (4 mg total) by mouth daily at 6 PM. 12/05/14   Mutaz Elmahi, MD   BP 158/102 mmHg  Pulse 80  Temp(Src) 98.7 F (37.1 C) (Oral)  Resp 16  Ht _0  (1.854 m)  Wt 173 lb 8 oz (78.699 kg)  BMI 22.90 kg/m2  SpO2 100% Physical Exam  Constitutional: He appears well-developed and well-nourished.  Neck: JVD present.  Small amount of JVD  Cardiovascular: Normal rate.   Pulmonary/Chest: He has no rales.  Abdominal:  Peritoneal dialysis catheter to abdominal wall  Musculoskeletal: Normal range of motion.  Neurological: He is alert.    ED Course  Procedures (including critical care time) Labs Review Labs Reviewed - No data to display  Imaging Review No results found.   EKG Interpretation None      MDM   Final diagnoses:  None    Patient with end-stage renal failure on dialysis. He does not have dialysis home. He has no complaints. Discussed with Dr. Lorrene Reid, who recommended checking labs and if it is a severe hyperkalemia that cannot be treated with Kayexalate he may be  dialyzed tonight. Otherwise she is to follow-up tomorrow.    Davonna Belling, MD 12/26/14 [phone]

## 2014-12-26 NOTE — ED Notes (Signed)
Pt here for dialysis treatment.  Last treatment on Friday.  No complaints voiced

## 2014-12-27 NOTE — ED Notes (Signed)
Pt irate that he cannot dialyze.

## 2014-12-27 NOTE — ED Notes (Signed)
Security escorted pt out.

## 2014-12-28 ENCOUNTER — Encounter (HOSPITAL_COMMUNITY): Payer: Self-pay | Admitting: Emergency Medicine

## 2014-12-28 ENCOUNTER — Emergency Department (HOSPITAL_COMMUNITY)
Admission: EM | Admit: 2014-12-28 | Discharge: 2014-12-29 | Disposition: A | Discharge status: Home / Self Care | Payer: Medicare Other | Attending: Emergency Medicine | Admitting: Emergency Medicine

## 2014-12-28 DIAGNOSIS — R2240 Localized swelling, mass and lump, unspecified lower limb: Secondary | ICD-10-CM | POA: Insufficient documentation

## 2014-12-28 DIAGNOSIS — F329 Major depressive disorder, single episode, unspecified: Secondary | ICD-10-CM | POA: Insufficient documentation

## 2014-12-28 DIAGNOSIS — E119 Type 2 diabetes mellitus without complications: Secondary | ICD-10-CM | POA: Insufficient documentation

## 2014-12-28 DIAGNOSIS — I120 Hypertensive chronic kidney disease with stage 5 chronic kidney disease or end stage renal disease: Secondary | ICD-10-CM | POA: Diagnosis not present

## 2014-12-28 DIAGNOSIS — I509 Heart failure, unspecified: Secondary | ICD-10-CM | POA: Insufficient documentation

## 2014-12-28 DIAGNOSIS — F419 Anxiety disorder, unspecified: Secondary | ICD-10-CM | POA: Insufficient documentation

## 2014-12-28 DIAGNOSIS — Z79899 Other long term (current) drug therapy: Secondary | ICD-10-CM | POA: Diagnosis not present

## 2014-12-28 DIAGNOSIS — Z992 Dependence on renal dialysis: Secondary | ICD-10-CM | POA: Insufficient documentation

## 2014-12-28 DIAGNOSIS — Z791 Long term (current) use of non-steroidal anti-inflammatories (NSAID): Secondary | ICD-10-CM | POA: Insufficient documentation

## 2014-12-28 DIAGNOSIS — Z7901 Long term (current) use of anticoagulants: Secondary | ICD-10-CM | POA: Insufficient documentation

## 2014-12-28 DIAGNOSIS — Z87891 Personal history of nicotine dependence: Secondary | ICD-10-CM | POA: Insufficient documentation

## 2014-12-28 DIAGNOSIS — N186 End stage renal disease: Secondary | ICD-10-CM | POA: Diagnosis not present

## 2014-12-28 DIAGNOSIS — Z862 Personal history of diseases of the blood and blood-forming organs and certain disorders involving the immune mechanism: Secondary | ICD-10-CM | POA: Diagnosis not present

## 2014-12-28 DIAGNOSIS — Z4931 Encounter for adequacy testing for hemodialysis: Secondary | ICD-10-CM | POA: Diagnosis present

## 2014-12-28 DIAGNOSIS — R0602 Shortness of breath: Secondary | ICD-10-CM | POA: Diagnosis not present

## 2014-12-28 LAB — BASIC METABOLIC PANEL
Anion gap: 15 (ref 5–15)
BUN: 60 mg/dL — ABNORMAL HIGH (ref 6–20)
CHLORIDE: 100 mmol/L — AB (ref 101–111)
CO2: 20 mmol/L — ABNORMAL LOW (ref 22–32)
CREATININE: 17.88 mg/dL — AB (ref 0.61–1.24)
Calcium: 7.7 mg/dL — ABNORMAL LOW (ref 8.9–10.3)
GFR calc Af Amer: 3 mL/min — ABNORMAL LOW (ref >60)
GFR, EST NON AFRICAN AMERICAN: 3 mL/min — AB (ref >60)
Glucose, Bld: 99 mg/dL (ref 65–99)
Potassium: 5.3 mmol/L — ABNORMAL HIGH (ref 3.5–5.1)
Sodium: 135 mmol/L (ref 135–145)

## 2014-12-28 NOTE — ED Notes (Signed)
Pt states he needs dialysis.  C/o sob.  Reports last dialysis on Friday.  Normally MWF.  States he came to ED on Monday for same and was discharged home.

## 2014-12-28 NOTE — ED Provider Notes (Signed)
CSN: [id number]     Arrival date & time 12/28/14  1940 History  This chart was scribed for Rolland Porter, MD by Rayfield Citizen, ED Scribe. This patient was seen in room A11C/A11C and the patient's care was started at 11:19 PM.    Chief Complaint  Patient presents with  . request dialysis    The history is provided by the patient. No language interpreter was used.     HPI Comments: Frank [name] is a 49 y.o. male with past medical history of HTN, DM, SOB, ESRD, CHF who presents to the Emergency Department requesting dialysis (last dialyzed 6 days PTA). Patient complains of SOB, beginning today. He also notes 2 days of lower extremity swelling. He denies chest pain.   Dialysis patient for the past 14 years; normal schedule Monday, Wednesday, Friday. Previously dialyzed at [facility] but for the past 3 weeks dialyzed here at [facility] ED. Previous dialysis [facility] [place] [facility] but patient states he no longer has a relationship with this provider.   Patient states he no longer smokes. He is a [place] resident, currently living with his sister.   Nephrologist none  Past Medical History  Diagnosis Date  . Hypertension   . Depression   . Complication of anesthesia     itching, sore throat  . Diabetes mellitus without complication     No history per patient, but remains under history as A1c would not be accurate given on dialysis  . Shortness of breath   . Anxiety   . ESRD (end stage renal disease)     due to HTN per patient, followed at [facility], s/p failed kidney transplant - dialysis Tue, Th, Sat  . Renal insufficiency   . CHF (congestive heart failure)   . Anemia   . Dialysis patient    Past Surgical History  Procedure Laterality Date  . Kidney receipient  2006    failed and started HD in March 2014  . Capd insertion    . Capd removal    . Left heart catheterization with coronary angiogram N/A 09/02/2014    Procedure: LEFT HEART CATHETERIZATION WITH CORONARY ANGIOGRAM;  Surgeon: [name], MD;  Location: [facility] CATH LAB;  Service: Cardiovascular;  Laterality: N/A;   No family history on file. History  Substance Use Topics  . Smoking status: Former Smoker -- 1.00 packs/day for 1 years    Types: Cigarettes  . Smokeless tobacco: Never Used     Comment: quit Jan 2014  . Alcohol Use: No  living with sister  Review of Systems  Respiratory: Positive for shortness of breath.   Cardiovascular: Positive for leg swelling. Negative for chest pain.  All other systems reviewed and are negative.   Allergies  Ferrlecit and Darvocet  Home Medications   Prior to Admission medications   Medication Sig Start Date End Date Taking? Authorizing Provider  atorvastatin (LIPITOR) 40 MG tablet Take 1 tablet (40 mg total) by mouth daily at 6 PM. 09/02/14  Yes Barton Dubois, MD  carvedilol (COREG) 25 MG tablet Take 1 tablet (25 mg total) by mouth 2 (two) times daily with a meal. 10/10/14  Yes [name], DO  cinacalcet (SENSIPAR) 30 MG tablet Take 30 mg by mouth daily.   Yes Historical Provider, MD  diclofenac sodium (VOLTAREN) 1 % GEL Apply 2 g topically 4 (four) times daily. 11/14/14  Yes Tresa Garter, MD  dicyclomine (BENTYL) 20 MG tablet Take 1 tablet (20 mg total) by  mouth 2 (two) times daily. 12/20/14  Yes Ernestina Patches, MD  docusate sodium (COLACE) 100 MG capsule Take 1 capsule (100 mg total) by mouth 2 (two) times daily. 11/19/14  Yes [name], MD  doxercalciferol (HECTOROL) 4 MCG/2ML injection Inject 1.25 mLs (2.5 mcg total) into the vein Every Tuesday,Thursday,and Saturday with dialysis. 10/10/14  Yes [name], DO  lisinopril (PRINIVIL,ZESTRIL) 20 MG tablet Take 1 tablet (20 mg total) by mouth daily. 12/05/14  Yes Verlee Monte, MD  LORazepam (ATIVAN) 0.5 MG tablet Take 1 tablet (0.5 mg total) by mouth at bedtime as needed for anxiety. 11/19/14  Yes [name], MD  methocarbamol (ROBAXIN) 500 MG tablet Take 1 tablet (500 mg total) by mouth every 8 (eight) hours  as needed for muscle spasms. 11/19/14  Yes [name], MD  omeprazole (PRILOSEC) 20 MG capsule Take 20 mg by mouth daily. 07/01/13  Yes Historical Provider, MD  ondansetron (ZOFRAN) 4 MG tablet Take 1 tablet (4 mg total) by mouth every 6 (six) hours. 12/20/14  Yes Ernestina Patches, MD  ondansetron (ZOFRAN-ODT) 4 MG disintegrating tablet Take 1 tablet (4 mg total) by mouth every 8 (eight) hours as needed for nausea or vomiting. 12/05/14  Yes Verlee Monte, MD  predniSONE (DELTASONE) 5 MG tablet Take 5 mg by mouth daily with breakfast.   Yes Historical Provider, MD  warfarin (COUMADIN) 4 MG tablet Take 1 tablet (4 mg total) by mouth daily at 6 PM. 12/05/14  Yes Verlee Monte, MD  ciprofloxacin (CIPRO) 500 MG tablet Take 1 tablet (500 mg total) by mouth daily. Patient not taking: Reported on 12/20/2014 12/16/14   Larene Pickett, PA-C  enoxaparin (LOVENOX) 80 MG/0.8ML injection Inject 0.8 mLs (80 mg total) into the skin daily. Patient not taking: Reported on 12/26/2014 12/16/14   Ezequiel Essex, MD  levalbuterol [facility] HFA) 45 MCG/ACT inhaler Inhale 2 puffs into the lungs every 6 (six) hours as needed for wheezing or shortness of breath. 11/19/14   Ripudeep K Rai, MD   BP 179/107 mmHg  Pulse 85  Temp(Src) 98.5 F (36.9 C) (Oral)  Resp 20  SpO2 98%   Vital signs normal except hypertension   Physical Exam  Constitutional: He is oriented to person, place, and time. He appears well-developed and well-nourished.  Non-toxic appearance. He does not appear ill. No distress.  Lying flat in bed comfortably without respiratory distress  HENT:  Head: Normocephalic and atraumatic.  Right Ear: External ear normal.  Left Ear: External ear normal.  Nose: Nose normal. No mucosal edema or rhinorrhea.  Mouth/Throat: Oropharynx is clear and moist and mucous membranes are normal. No dental abscesses or uvula swelling.  Eyes: Conjunctivae and EOM are normal. Pupils are equal, round, and reactive to light.  Neck:  Normal range of motion and full passive range of motion without pain. Neck supple.  Cardiovascular: Normal rate, regular rhythm and normal heart sounds.  Exam reveals no gallop and no friction rub.   No murmur heard. Pulmonary/Chest: Effort normal and breath sounds normal. No respiratory distress. He has no wheezes. He has no rhonchi. He has no rales. He exhibits no tenderness and no crepitus.  Abdominal: Soft. Normal appearance and bowel sounds are normal. He exhibits no distension. There is no tenderness. There is no rebound and no guarding.  Musculoskeletal: Normal range of motion. He exhibits no edema or tenderness.  Moves all extremities well.   Neurological: He is alert and oriented to person, place, and time.  He has normal strength. No cranial nerve deficit.  Skin: Skin is warm, dry and intact. No rash noted. No erythema. No pallor.  Psychiatric: He has a normal mood and affect. His speech is normal and behavior is normal. His mood appears not anxious.  Nursing note and vitals reviewed.   ED Course  Procedures   Medications  sodium polystyrene (KAYEXALATE) 15 GM/60ML suspension 30 g (not administered)     DIAGNOSTIC STUDIES: Oxygen Saturation is 98% on RA, normal by my interpretation.    COORDINATION OF CARE: 11:24 PM Discussed treatment plan with pt at bedside and pt agreed to plan.  00:31 Dr Posey Pronto, nephrology discussed patient's labs, vital signs. States he does not meet criteria for emergent dialysis tonight. He will check later today to see if patient has been accepted yet at a [place] dialysis center. He wants patient to be given kayexalate 30 grams tonight.  Labs Review Results for orders placed or performed during the hospital encounter of 82/99/37  Basic metabolic panel  Result Value Ref Range   Sodium 135 135 - 145 mmol/L   Potassium 5.3 (H) 3.5 - 5.1 mmol/L   Chloride 100 (L) 101 - 111 mmol/L   CO2 20 (L) 22 - 32 mmol/L   Glucose, Bld 99 65 - 99 mg/dL   BUN 60 (H) 6  - 20 mg/dL   Creatinine, Ser 17.88 (H) 0.61 - 1.24 mg/dL   Calcium 7.7 (L) 8.9 - 10.3 mg/dL   GFR calc non Af Amer 3 (L) >60 mL/min   GFR calc Af Amer 3 (L) >60 mL/min   Anion gap 15 5 - 15    Laboratory interpretation all normal except mild hyperkalemia, renal failure, high normal AG   Imaging Review No results found.   EKG Interpretation None      MDM   Final diagnoses:  ESRD on hemodialysis    Plan discharge  Rolland Porter, MD, FACEP   I personally performed the services described in this documentation, which was scribed in my presence. The recorded information has been reviewed and considered.  Rolland Porter, MD, Barbette Or, MD 12/29/14 [phone]

## 2014-12-29 ENCOUNTER — Encounter (HOSPITAL_COMMUNITY): Payer: Self-pay | Admitting: Emergency Medicine

## 2014-12-29 ENCOUNTER — Emergency Department (HOSPITAL_COMMUNITY)
Admission: EM | Admit: 2014-12-29 | Discharge: 2014-12-29 | Disposition: A | Discharge status: Home / Self Care | Payer: Medicare Other | Attending: Emergency Medicine | Admitting: Emergency Medicine

## 2014-12-29 DIAGNOSIS — Z992 Dependence on renal dialysis: Secondary | ICD-10-CM | POA: Insufficient documentation

## 2014-12-29 DIAGNOSIS — F419 Anxiety disorder, unspecified: Secondary | ICD-10-CM | POA: Diagnosis not present

## 2014-12-29 DIAGNOSIS — Z791 Long term (current) use of non-steroidal anti-inflammatories (NSAID): Secondary | ICD-10-CM | POA: Insufficient documentation

## 2014-12-29 DIAGNOSIS — Z7952 Long term (current) use of systemic steroids: Secondary | ICD-10-CM | POA: Insufficient documentation

## 2014-12-29 DIAGNOSIS — Z862 Personal history of diseases of the blood and blood-forming organs and certain disorders involving the immune mechanism: Secondary | ICD-10-CM | POA: Insufficient documentation

## 2014-12-29 DIAGNOSIS — E119 Type 2 diabetes mellitus without complications: Secondary | ICD-10-CM | POA: Insufficient documentation

## 2014-12-29 DIAGNOSIS — I120 Hypertensive chronic kidney disease with stage 5 chronic kidney disease or end stage renal disease: Secondary | ICD-10-CM | POA: Diagnosis present

## 2014-12-29 DIAGNOSIS — Z7901 Long term (current) use of anticoagulants: Secondary | ICD-10-CM | POA: Diagnosis not present

## 2014-12-29 DIAGNOSIS — Z87891 Personal history of nicotine dependence: Secondary | ICD-10-CM | POA: Insufficient documentation

## 2014-12-29 DIAGNOSIS — N186 End stage renal disease: Secondary | ICD-10-CM | POA: Insufficient documentation

## 2014-12-29 DIAGNOSIS — F329 Major depressive disorder, single episode, unspecified: Secondary | ICD-10-CM | POA: Diagnosis not present

## 2014-12-29 DIAGNOSIS — I509 Heart failure, unspecified: Secondary | ICD-10-CM | POA: Insufficient documentation

## 2014-12-29 LAB — BASIC METABOLIC PANEL
ANION GAP: 15 (ref 5–15)
BUN: 66 mg/dL — ABNORMAL HIGH (ref 6–20)
CALCIUM: 7.7 mg/dL — AB (ref 8.9–10.3)
CO2: 20 mmol/L — AB (ref 22–32)
CREATININE: 19.61 mg/dL — AB (ref 0.61–1.24)
Chloride: 99 mmol/L — ABNORMAL LOW (ref 101–111)
GFR, EST AFRICAN AMERICAN: 3 mL/min — AB (ref >60)
GFR, EST NON AFRICAN AMERICAN: 2 mL/min — AB (ref >60)
GLUCOSE: 104 mg/dL — AB (ref 65–99)
Potassium: 5.6 mmol/L — ABNORMAL HIGH (ref 3.5–5.1)
Sodium: 134 mmol/L — ABNORMAL LOW (ref 135–145)

## 2014-12-29 MED ORDER — METHOCARBAMOL 500 MG PO TABS
500.0000 mg | ORAL_TABLET | Freq: Once | ORAL | Status: AC
  Administered 2014-12-29: 500 mg via ORAL
  Filled 2014-12-29: qty 1

## 2014-12-29 MED ORDER — SODIUM POLYSTYRENE SULFONATE 15 GM/60ML PO SUSP
30.0000 g | Freq: Once | ORAL | Status: AC
  Administered 2014-12-29: 30 g via ORAL
  Filled 2014-12-29: qty 120

## 2014-12-29 MED ORDER — METHOCARBAMOL 500 MG PO TABS: 500.0000 mg | ORAL_TABLET | Freq: Three times a day (TID) | ORAL | Status: DC | PRN

## 2014-12-29 NOTE — Discharge Instructions (Signed)
Dr Posey Pronto, the nephrologist on call tonight, does not feel you need emergent dialysis tonight. He is going to check the dialysis centers today to see if you have been accepted yet.

## 2014-12-29 NOTE — Discharge Instructions (Signed)
Dialysis Diet A dialysis diet is a special diet when you start peritoneal dialysis or hemodialysis. Foods must be chosen carefully because different foods produce different wastes in your blood. If you are on dialysis treatment, that means your kidneys have stopped working properly on their own. This means the kidneys are removing very little or no wastes from your blood. Dialysis can perform the function of a healthy kidney and filter wastes from your body. However, between dialysis sessions, wastes build up in your blood and can make you sick. You can reduce the amount of wastes that build up in your blood by watching what you eat and drink. A good meal plan can improve your dialysis and your health. Your dietitian or renal dietitian can help you plan meals.  FLUIDS In between dialysis sessions, your kidneys may be able to remove some fluid or none at all. Fluid can build up and cause swelling and weight gain. The extra fluid affects your blood pressure and can make your heart work harder. Overloading your body with fluid could lead to serious heart trouble. Every person on dialysis has a different amount of fluid that they can have each day. Talk to your dietitian about how much fluid you can have each day and write that down.  Foods that add to your fluid intake include:  Foods that contain liquid at room temperature, such as soup, gelatin dessert, and ice cream.  Fruits and vegetables, such as melons, grapes, apples, oranges, tomatoes, lettuce, and celery. Your dietitian will be able to give you other tips for managing your thirst. POTASSIUM Potassium is a mineral found in many foods, especially milk, fruits, and vegetables. It affects how steadily your heart beats. Potassium levels can rise between dialysis sessions and can affect your heartbeat and may even cause death.  Avoid foods high in potassium. If you do eat high-potassium foods, eat smaller portions. For example, eat half a pear instead of  a whole pear. Eat only very small portions of oranges and melons. You can remove some of the potassium from potatoes and other vegetables by peeling them and then soaking them in a large amount of water for several hours. Drain and rinse them before cooking. Talk to a dietitian about foods you can eat instead of high-potassium foods. High-potassium foods and drinks include:  Apricots.  Brussels sprouts.  Dates.  Lima beans.  Oranges.  Prune juice.  Spinach.  Avocados.  Milk.  Figs.  Melons.  Peanuts.  Prunes.  Tomatoes.  Bananas.  Cantaloupe.  Kiwi fruit.  Nectarines.  Asparagus spears.  Raisins.  Winter squash.  Beets.  Clams.  Orange juice.  Potatoes.  Sardines.  Yogurt. PHOSPHORUS Phosphorus is a mineral found in many foods. If you have too much phosphorus in your blood, your bones lose calcium. Losing calcium will make your bones weak and more likely to break. Also, too much phosphorus may make your skin itch. Food high in phosphorus include milk, cheese, dried beans, peas, colas, nuts, and peanut butter. Avoid eating too much of these foods. Usually, people on dialysis are limited to  cup of milk per day. Talk to a dietitian about foods you can eat instead of high-phosphorus foods. PROTEIN You may be encouraged to eat as much "high-quality protein" as you can. High-quality proteins come from meat, fish, poultry, and eggs. Choose low-fat (lean) meats that are also low in phosphorus. If you are a vegetarian, ask your dietitian about other ways to get your protein. Low-fat milk  is a good source of protein, but milk is high in phosphorus and potassium and adds to your fluid intake. Talk to a dietitian to see if milk fits into your food plan. SODIUM Sodium makes you thirsty, and if you are on dialysis, you must restrict how much fluid you drink. Therefore, try to eat fresh foods that are naturally low in sodium. Stay away from canned foods and frozen  dinners. Look for products labeled "low sodium." Do not use salt substitutes because they contain potassium. Talk to your dietitian about foods and spice blends without sodium or potassium.  CALORIES Calories come from food and provide energy for your body. Your dietitian may recommend adding or cutting down on the calories you eat depending on if you need to lose or gain weight. Talk to a dietitian about foods you can eat to either gain or lose weight. VITAMINS AND MINERALS Your caregiver may prescribe a vitamin and mineral supplement. Vitamins and minerals may be missing from your diet because you have to avoid so many foods. Talk to your dietitian or kidney doctor before choosing a supplement. Many vitamin or mineral supplements sold on the store shelf may be harmful to you. Document Released: 04/04/2004 Document Revised: 01/07/2012 Document Reviewed: 09/17/2011 [facility] Patient Information 2015 [place], [place]. This information is not intended to replace advice given to you by your health care provider. Make sure you discuss any questions you have with your health care provider.  End-Stage Kidney Disease The kidneys are two organs that lie on either side of the spine between the middle of the back and the front of the abdomen. The kidneys:   Remove wastes and extra water from the blood.   Produce important hormones. These help keep bones strong, regulate blood pressure, and help create red blood cells.   Balance the fluids and chemicals in the blood and tissues. End-stage kidney disease occurs when the kidneys are so damaged that they cannot do their job. When the kidneys cannot do their job, life-threatening problems occur. The body cannot stay clean and strong without the help of the kidneys. In end-stage kidney disease, the kidneys cannot get better.You need a new kidney or treatments to do some of the work healthy kidneys do in order to stay alive. CAUSES  End-stage kidney disease  usually occurs when a long-lasting (chronic) kidney disease gets worse. It may also occur after the kidneys are suddenly damaged (acute kidney injury).  SYMPTOMS   Swelling (edema) of the legs, ankles, or feet.   Tiredness (lethargy).   Nausea or vomiting.   Confusion.   Problems with urination, such as:   Decreased urine production.   Frequent urination, especially at night.   Frequent accidents in children who are potty trained.   Muscle twitches and cramps.   Persistent itchiness.   Loss of appetite.   Headaches.   Abnormally dark or light skin.   Numbness in the hands or feet.   Easy bruising.   Frequent hiccups.   Menstruation stops. DIAGNOSIS  Your health care provider will measure your blood pressure and take some tests. These may include:   Urine tests.   Blood tests.   Imaging tests, such as:   An ultrasound exam.   Computed tomography (CT).  A kidney biopsy. TREATMENT  There are two treatments for end-stage kidney disease:   A procedure that removes toxic wastes from the body (dialysis).   Receiving a new kidney (kidney transplant). Both of these treatments have serious risks  and consequences. Your health care provider will help you determine which treatment is best for you based on your health, age, and other factors. In addition to having dialysis or a kidney transplant, you may need to take medicines to control high blood pressure (hypertension) and cholesterol and to decrease phosphorus levels in your blood.  HOME CARE INSTRUCTIONS  Follow your prescribed diet.   Take medicines only as directed by your health care provider.   Do not take any new medicines (prescription, over-the-counter, or nutritional supplements) unless approved by your health care provider. Many medicines can worsen your kidney damage or need to have the dose adjusted.   Keep all follow-up visits as directed by your health care provider. MAKE  SURE YOU:  Understand these instructions.  Will watch your condition.  Will get help right away if you are not doing well or get worse. Document Released: 09/28/2003 Document Revised: 11/22/2013 Document Reviewed: 03/06/2012 [facility] Patient Information 2015 [place], [place]. This information is not intended to replace advice given to you by your health care provider. Make sure you discuss any questions you have with your health care provider.

## 2014-12-29 NOTE — ED Notes (Signed)
Pt. requesting hemodialysis , denies pain / respirations unlabored , no other complaints.

## 2014-12-29 NOTE — ED Notes (Signed)
Pt states he is here for his dialysis. He states his last dialysis was last week and was told to come to this ER for his dialysis.

## 2014-12-29 NOTE — ED Provider Notes (Signed)
CSN: [id number]     Arrival date & time 12/29/14  1937 History   First MD Initiated Contact with Patient 12/29/14 2100     No chief complaint on file.     HPI Patient presents for dialysis.  Was here yesterday for same.  No specific complaints.  Was 6 out of his last dialysis center.  Is dialyzed here on need basis. Past Medical History  Diagnosis Date  . Hypertension   . Depression   . Complication of anesthesia     itching, sore throat  . Diabetes mellitus without complication     No history per patient, but remains under history as A1c would not be accurate given on dialysis  . Shortness of breath   . Anxiety   . ESRD (end stage renal disease)     due to HTN per patient, followed at [facility], s/p failed kidney transplant - dialysis Tue, Th, Sat  . Renal insufficiency   . CHF (congestive heart failure)   . Anemia   . Dialysis patient    Past Surgical History  Procedure Laterality Date  . Kidney receipient  2006    failed and started HD in March 2014  . Capd insertion    . Capd removal    . Left heart catheterization with coronary angiogram N/A 09/02/2014    Procedure: LEFT HEART CATHETERIZATION WITH CORONARY ANGIOGRAM;  Surgeon: [name], MD;  Location: [facility] CATH LAB;  Service: Cardiovascular;  Laterality: N/A;   No family history on file. History  Substance Use Topics  . Smoking status: Former Smoker -- 1.00 packs/day for 1 years    Types: Cigarettes  . Smokeless tobacco: Never Used     Comment: quit Jan 2014  . Alcohol Use: No    Review of Systems  All other systems reviewed and are negative  Allergies  Ferrlecit and Darvocet  Home Medications   Prior to Admission medications   Medication Sig Start Date End Date Taking? Authorizing Provider  atorvastatin (LIPITOR) 40 MG tablet Take 1 tablet (40 mg total) by mouth daily at 6 PM. 09/02/14  Yes Barton Dubois, MD  carvedilol (COREG) 25 MG tablet Take 1 tablet (25 mg total) by mouth 2 (two) times daily with a  meal. 10/10/14  Yes [name], DO  cinacalcet (SENSIPAR) 30 MG tablet Take 30 mg by mouth daily.   Yes Historical Provider, MD  diclofenac sodium (VOLTAREN) 1 % GEL Apply 2 g topically 4 (four) times daily. 11/14/14  Yes Tresa Garter, MD  dicyclomine (BENTYL) 20 MG tablet Take 1 tablet (20 mg total) by mouth 2 (two) times daily. 12/20/14  Yes Ernestina Patches, MD  docusate sodium (COLACE) 100 MG capsule Take 1 capsule (100 mg total) by mouth 2 (two) times daily. 11/19/14  Yes [name], MD  doxercalciferol (HECTOROL) 4 MCG/2ML injection Inject 1.25 mLs (2.5 mcg total) into the vein Every Tuesday,Thursday,and Saturday with dialysis. 10/10/14  Yes [name], DO  lisinopril (PRINIVIL,ZESTRIL) 20 MG tablet Take 1 tablet (20 mg total) by mouth daily. 12/05/14  Yes Verlee Monte, MD  omeprazole (PRILOSEC) 20 MG capsule Take 20 mg by mouth daily. 07/01/13  Yes Historical Provider, MD  ondansetron (ZOFRAN-ODT) 4 MG disintegrating tablet Take 1 tablet (4 mg total) by mouth every 8 (eight) hours as needed for nausea or vomiting. 12/05/14  Yes Verlee Monte, MD  predniSONE (DELTASONE) 5 MG tablet Take 5 mg by mouth daily with breakfast.   Yes Historical  Provider, MD  warfarin (COUMADIN) 4 MG tablet Take 1 tablet (4 mg total) by mouth daily at 6 PM. 12/05/14  Yes Verlee Monte, MD  ciprofloxacin (CIPRO) 500 MG tablet Take 1 tablet (500 mg total) by mouth daily. Patient not taking: Reported on 12/20/2014 12/16/14   Larene Pickett, PA-C  enoxaparin (LOVENOX) 80 MG/0.8ML injection Inject 0.8 mLs (80 mg total) into the skin daily. Patient not taking: Reported on 12/26/2014 12/16/14   Ezequiel Essex, MD  levalbuterol [facility] HFA) 45 MCG/ACT inhaler Inhale 2 puffs into the lungs every 6 (six) hours as needed for wheezing or shortness of breath. 11/19/14   [name], MD  LORazepam (ATIVAN) 0.5 MG tablet Take 1 tablet (0.5 mg total) by mouth at bedtime as needed for anxiety. 11/19/14   [name], MD   methocarbamol (ROBAXIN) 500 MG tablet Take 1 tablet (500 mg total) by mouth every 8 (eight) hours as needed for muscle spasms. 11/19/14   [name], MD  ondansetron (ZOFRAN) 4 MG tablet Take 1 tablet (4 mg total) by mouth every 6 (six) hours. 12/20/14   Ernestina Patches, MD   BP 159/95 mmHg  Pulse 84  Temp(Src) 98.8 F (37.1 C) (Oral)  Resp 15  Ht _0  (1.854 m)  Wt 181 lb 4.8 oz (82.237 kg)  BMI 23.92 kg/m2  SpO2 98% Physical Exam  Constitutional: He is oriented to person, place, and time. He appears well-developed and well-nourished. No distress.  HENT:  Head: Normocephalic and atraumatic.  Eyes: Pupils are equal, round, and reactive to light.  Neck: Normal range of motion.  Cardiovascular: Normal rate and intact distal pulses.   Pulmonary/Chest: No respiratory distress. He has no wheezes. He has no rales.  Abdominal: Normal appearance. He exhibits no distension.  Musculoskeletal: Normal range of motion.  Neurological: He is alert and oriented to person, place, and time. No cranial nerve deficit.  Skin: Skin is warm and dry. No rash noted.  Psychiatric: He has a normal mood and affect. His behavior is normal.  Nursing note and vitals reviewed.   ED Course  Procedures (including critical care time) Medications  sodium polystyrene (KAYEXALATE) 15 GM/60ML suspension 30 g (not administered)    Labs Review Labs Reviewed  BASIC METABOLIC PANEL - Abnormal; Notable for the following:    Sodium 134 (*)    Potassium 5.6 (*)    Chloride 99 (*)    CO2 20 (*)    Glucose, Bld 104 (*)    BUN 66 (*)    Creatinine, Ser 19.61 (*)    Calcium 7.7 (*)    GFR calc non Af Amer 2 (*)    GFR calc Af Amer 3 (*)    All other components within normal limits    Imaging Review No results found.   EKG Interpretation   Date/Time:  Thursday December 29 2014 21:33:14 EDT Ventricular Rate:  81 PR Interval:  183 QRS Duration: 87 QT Interval:  426 QTC Calculation: 494 R Axis:   -32 Text  Interpretation:  Sinus rhythm Left axis deviation Consider anterior  infarct Nonspecific T abnormalities, lateral leads Baseline wander in  lead(s) I II aVR V6 No significant change since last tracing Confirmed by  Aristotelis Vilardi  MD, [employer] ([id number]) on 12/29/2014 9:52:21 PM     I discussed with Dr. Dennison Bulla not O from renal and also case management.  Patient is on a dialysis as needed basis.  At this time potassium of 5.6, EKG looks  unremarkable and patient has no respiratory complaints.  He can return tomorrow for recheck as needed.  Patient appears stable for discharge. MDM   Final diagnoses:  None        Leonard Schwartz, MD 12/29/14 2309

## 2014-12-31 ENCOUNTER — Emergency Department (HOSPITAL_COMMUNITY)
Admission: EM | Admit: 2014-12-31 | Discharge: 2014-12-31 | Disposition: A | Discharge status: Home / Self Care | Payer: Medicare Other | Source: Home / Self Care | Attending: Emergency Medicine | Admitting: Emergency Medicine

## 2014-12-31 ENCOUNTER — Encounter (HOSPITAL_COMMUNITY): Payer: Self-pay | Admitting: *Deleted

## 2014-12-31 ENCOUNTER — Emergency Department (HOSPITAL_COMMUNITY): Payer: Medicare Other

## 2014-12-31 DIAGNOSIS — N186 End stage renal disease: Secondary | ICD-10-CM | POA: Diagnosis present

## 2014-12-31 DIAGNOSIS — F419 Anxiety disorder, unspecified: Secondary | ICD-10-CM | POA: Diagnosis present

## 2014-12-31 DIAGNOSIS — Z7952 Long term (current) use of systemic steroids: Secondary | ICD-10-CM

## 2014-12-31 DIAGNOSIS — D631 Anemia in chronic kidney disease: Secondary | ICD-10-CM | POA: Diagnosis present

## 2014-12-31 DIAGNOSIS — Z79899 Other long term (current) drug therapy: Secondary | ICD-10-CM

## 2014-12-31 DIAGNOSIS — E785 Hyperlipidemia, unspecified: Secondary | ICD-10-CM | POA: Diagnosis present

## 2014-12-31 DIAGNOSIS — I2782 Chronic pulmonary embolism: Secondary | ICD-10-CM | POA: Diagnosis present

## 2014-12-31 DIAGNOSIS — F329 Major depressive disorder, single episode, unspecified: Secondary | ICD-10-CM | POA: Diagnosis present

## 2014-12-31 DIAGNOSIS — R0602 Shortness of breath: Secondary | ICD-10-CM

## 2014-12-31 DIAGNOSIS — Z9115 Patient's noncompliance with renal dialysis: Secondary | ICD-10-CM | POA: Diagnosis present

## 2014-12-31 DIAGNOSIS — Z8249 Family history of ischemic heart disease and other diseases of the circulatory system: Secondary | ICD-10-CM

## 2014-12-31 DIAGNOSIS — Z7901 Long term (current) use of anticoagulants: Secondary | ICD-10-CM

## 2014-12-31 DIAGNOSIS — E1122 Type 2 diabetes mellitus with diabetic chronic kidney disease: Secondary | ICD-10-CM | POA: Diagnosis present

## 2014-12-31 DIAGNOSIS — Z87891 Personal history of nicotine dependence: Secondary | ICD-10-CM

## 2014-12-31 DIAGNOSIS — Z940 Kidney transplant status: Secondary | ICD-10-CM

## 2014-12-31 DIAGNOSIS — E8770 Fluid overload, unspecified: Secondary | ICD-10-CM | POA: Diagnosis not present

## 2014-12-31 DIAGNOSIS — I120 Hypertensive chronic kidney disease with stage 5 chronic kidney disease or end stage renal disease: Principal | ICD-10-CM | POA: Diagnosis present

## 2014-12-31 DIAGNOSIS — Z885 Allergy status to narcotic agent status: Secondary | ICD-10-CM

## 2014-12-31 DIAGNOSIS — Z86718 Personal history of other venous thrombosis and embolism: Secondary | ICD-10-CM

## 2014-12-31 DIAGNOSIS — E875 Hyperkalemia: Secondary | ICD-10-CM | POA: Diagnosis present

## 2014-12-31 DIAGNOSIS — Z888 Allergy status to other drugs, medicaments and biological substances status: Secondary | ICD-10-CM

## 2014-12-31 DIAGNOSIS — I5042 Chronic combined systolic (congestive) and diastolic (congestive) heart failure: Secondary | ICD-10-CM | POA: Diagnosis present

## 2014-12-31 LAB — I-STAT VENOUS BLOOD GAS, ED
Acid-base deficit: 7 mmol/L — ABNORMAL HIGH (ref 0.0–2.0)
Bicarbonate: 19.3 mEq/L — ABNORMAL LOW (ref 20.0–24.0)
O2 Saturation: 45 %
TCO2: 21 mmol/L (ref 0–100)
pCO2, Ven: 40.9 mmHg — ABNORMAL LOW (ref 45.0–50.0)
pH, Ven: 7.283 (ref 7.250–7.300)
pO2, Ven: 28 mmHg — CL (ref 30.0–45.0)

## 2014-12-31 LAB — BASIC METABOLIC PANEL
Anion gap: 18 — ABNORMAL HIGH (ref 5–15)
BUN: 80 mg/dL — AB (ref 6–20)
CALCIUM: 8 mg/dL — AB (ref 8.9–10.3)
CO2: 19 mmol/L — ABNORMAL LOW (ref 22–32)
Chloride: 100 mmol/L — ABNORMAL LOW (ref 101–111)
Creatinine, Ser: 21.94 mg/dL — ABNORMAL HIGH (ref 0.61–1.24)
GFR calc Af Amer: 2 mL/min — ABNORMAL LOW (ref >60)
GFR, EST NON AFRICAN AMERICAN: 2 mL/min — AB (ref >60)
Glucose, Bld: 111 mg/dL — ABNORMAL HIGH (ref 65–99)
POTASSIUM: 5.4 mmol/L — AB (ref 3.5–5.1)
SODIUM: 137 mmol/L (ref 135–145)

## 2014-12-31 LAB — CBC
HCT: 24.9 % — ABNORMAL LOW (ref 39.0–52.0)
Hemoglobin: 8 g/dL — ABNORMAL LOW (ref 13.0–17.0)
MCH: 26.1 pg (ref 26.0–34.0)
MCHC: 32.1 g/dL (ref 30.0–36.0)
MCV: 81.1 fL (ref 78.0–100.0)
Platelets: 328 10*3/uL (ref 150–400)
RBC: 3.07 MIL/uL — AB (ref 4.22–5.81)
RDW: 14.5 % (ref 11.5–15.5)
WBC: 6.9 10*3/uL (ref 4.0–10.5)

## 2014-12-31 MED ORDER — SODIUM POLYSTYRENE SULFONATE PO POWD: Freq: Once | ORAL | Status: DC

## 2014-12-31 NOTE — ED Notes (Signed)
MD at bedside. 

## 2014-12-31 NOTE — ED Notes (Signed)
Pt refusing to keep EKG leads, BP cuff, and SpO2 on at this time, requesting d/c paperwork.  MD aware

## 2014-12-31 NOTE — ED Notes (Addendum)
Pt needing to be dialyzed. Last treatment 9 days ago. Edema noted throughout. Denies shortness of breath.Pt c/o weakness and edema. Bilateral arms and legs with cracking skin. Also noted to be hypertensive.

## 2014-12-31 NOTE — ED Notes (Signed)
Pt O2 98-100% on RA while ambulating, tolerated well.

## 2014-12-31 NOTE — ED Notes (Signed)
MD aware of pt BP.  Pt denies any pain.

## 2014-12-31 NOTE — ED Notes (Signed)
Pt to ED needing dialysis; reports last treatment was 9 days ago. Reports bloating, denies shortness of breath. Pt noted to be hypertensive

## 2014-12-31 NOTE — ED Provider Notes (Signed)
CSN: [id number]     Arrival date & time 12/31/14  2595 History   First MD Initiated Contact with Patient 12/31/14 [phone]     Chief Complaint  Patient presents with  . Vascular Access Problem     (Consider location/radiation/quality/duration/timing/severity/associated sxs/prior Treatment) HPI Comments: 49 yo male presenting with request for dialysis. He states he is unable to get dialyzed at his usual dialysis center, which is in [place]. He also reports me recent ED visits for similar. He states the last time he was dialyzed was 8 days ago, here at [facility].  He complains of moderate shortness of breath, particularly when ambulating. He reports a greater than 15 pound weight gain. He denies palpitations. He endorses malaise. These symptoms have been constant and progressively worsening for the past several days.   Past Medical History  Diagnosis Date  . Hypertension   . Depression   . Complication of anesthesia     itching, sore throat  . Diabetes mellitus without complication     No history per patient, but remains under history as A1c would not be accurate given on dialysis  . Shortness of breath   . Anxiety   . ESRD (end stage renal disease)     due to HTN per patient, followed at [facility], s/p failed kidney transplant - dialysis Tue, Th, Sat  . Renal insufficiency   . CHF (congestive heart failure)   . Anemia   . Dialysis patient    Past Surgical History  Procedure Laterality Date  . Kidney receipient  2006    failed and started HD in March 2014  . Capd insertion    . Capd removal    . Left heart catheterization with coronary angiogram N/A 09/02/2014    Procedure: LEFT HEART CATHETERIZATION WITH CORONARY ANGIOGRAM;  Surgeon: [name], MD;  Location: [facility] CATH LAB;  Service: Cardiovascular;  Laterality: N/A;   History reviewed. No pertinent family history. History  Substance Use Topics  . Smoking status: Former Smoker -- 1.00 packs/day for 1 years    Types:  Cigarettes  . Smokeless tobacco: Never Used     Comment: quit Jan 2014  . Alcohol Use: No    Review of Systems  All other systems reviewed and are negative.     Allergies  Ferrlecit and Darvocet  Home Medications   Prior to Admission medications   Medication Sig Start Date End Date Taking? Authorizing Provider  atorvastatin (LIPITOR) 40 MG tablet Take 1 tablet (40 mg total) by mouth daily at 6 PM. 09/02/14   Barton Dubois, MD  carvedilol (COREG) 25 MG tablet Take 1 tablet (25 mg total) by mouth 2 (two) times daily with a meal. 10/10/14   [name], DO  cinacalcet (SENSIPAR) 30 MG tablet Take 30 mg by mouth daily.    Historical Provider, MD  ciprofloxacin (CIPRO) 500 MG tablet Take 1 tablet (500 mg total) by mouth daily. Patient not taking: Reported on 12/20/2014 12/16/14   Larene Pickett, PA-C  diclofenac sodium (VOLTAREN) 1 % GEL Apply 2 g topically 4 (four) times daily. 11/14/14   Tresa Garter, MD  dicyclomine (BENTYL) 20 MG tablet Take 1 tablet (20 mg total) by mouth 2 (two) times daily. 12/20/14   Ernestina Patches, MD  docusate sodium (COLACE) 100 MG capsule Take 1 capsule (100 mg total) by mouth 2 (two) times daily. 11/19/14   [name], MD  doxercalciferol (HECTOROL) 4 MCG/2ML injection Inject 1.25 mLs (2.5 mcg  total) into the vein Every Tuesday,Thursday,and Saturday with dialysis. 10/10/14   [name], DO  enoxaparin (LOVENOX) 80 MG/0.8ML injection Inject 0.8 mLs (80 mg total) into the skin daily. Patient not taking: Reported on 12/26/2014 12/16/14   Ezequiel Essex, MD  levalbuterol [facility] HFA) 45 MCG/ACT inhaler Inhale 2 puffs into the lungs every 6 (six) hours as needed for wheezing or shortness of breath. 11/19/14   [name], MD  lisinopril (PRINIVIL,ZESTRIL) 20 MG tablet Take 1 tablet (20 mg total) by mouth daily. 12/05/14   Verlee Monte, MD  LORazepam (ATIVAN) 0.5 MG tablet Take 1 tablet (0.5 mg total) by mouth at bedtime as needed for anxiety. 11/19/14    [name], MD  methocarbamol (ROBAXIN) 500 MG tablet Take 1 tablet (500 mg total) by mouth every 8 (eight) hours as needed for muscle spasms. 12/29/14   Leonard Schwartz, MD  omeprazole (PRILOSEC) 20 MG capsule Take 20 mg by mouth daily. 07/01/13   Historical Provider, MD  ondansetron (ZOFRAN) 4 MG tablet Take 1 tablet (4 mg total) by mouth every 6 (six) hours. 12/20/14   Ernestina Patches, MD  ondansetron (ZOFRAN-ODT) 4 MG disintegrating tablet Take 1 tablet (4 mg total) by mouth every 8 (eight) hours as needed for nausea or vomiting. 12/05/14   Verlee Monte, MD  predniSONE (DELTASONE) 5 MG tablet Take 5 mg by mouth daily with breakfast.    Historical Provider, MD  warfarin (COUMADIN) 4 MG tablet Take 1 tablet (4 mg total) by mouth daily at 6 PM. 12/05/14   Mutaz Elmahi, MD   BP 201/128 mmHg  Pulse 77  Temp(Src) 97.7 F (36.5 C) (Oral)  Resp 16  SpO2 100% Physical Exam  Constitutional: He is oriented to person, place, and time. He appears well-developed and well-nourished. No distress.  HENT:  Head: Normocephalic and atraumatic.  Mouth/Throat: Oropharynx is clear and moist.  Eyes: Conjunctivae are normal. Pupils are equal, round, and reactive to light. No scleral icterus.  Neck: Neck supple.  Cardiovascular: Normal rate, regular rhythm, normal heart sounds and intact distal pulses.   No murmur heard. Pulmonary/Chest: Effort normal and breath sounds normal. No stridor. No respiratory distress. He has no wheezes. He has no rales.  Abdominal: Soft. He exhibits no distension. There is no tenderness.  Musculoskeletal: Normal range of motion. He exhibits no edema.  Neurological: He is alert and oriented to person, place, and time.  Skin: Skin is warm and dry. No rash noted.  Psychiatric: He has a normal mood and affect. His behavior is normal.  Nursing note and vitals reviewed.   ED Course  Procedures (including critical care time) Labs Review Labs Reviewed  BASIC METABOLIC PANEL - Abnormal;  Notable for the following:    Potassium 5.4 (*)    Chloride 100 (*)    CO2 19 (*)    Glucose, Bld 111 (*)    BUN 80 (*)    Creatinine, Ser 21.94 (*)    Calcium 8.0 (*)    GFR calc non Af Amer 2 (*)    GFR calc Af Amer 2 (*)    Anion gap 18 (*)    All other components within normal limits  CBC - Abnormal; Notable for the following:    RBC 3.07 (*)    Hemoglobin 8.0 (*)    HCT 24.9 (*)    All other components within normal limits  I-STAT VENOUS BLOOD GAS, ED - Abnormal; Notable for the following:    pCO2,  Ven 40.9 (*)    pO2, Ven 28.0 (*)    Bicarbonate 19.3 (*)    Acid-base deficit 7.0 (*)    All other components within normal limits  BLOOD GAS, VENOUS    Imaging Review Dg Chest 2 View  12/31/2014   CLINICAL DATA:  Shortness of breath.  Edema.  On dialysis.  EXAM: CHEST  2 VIEW  COMPARISON:  12/12/2014 and 11/28/2014  FINDINGS: Right jugular dialysis catheter is unchanged. Cardiac silhouette remains enlarged. There is at most mild central pulmonary vascular congestion without evidence of overt pulmonary edema, airspace consolidation, sizeable pleural effusion, or pneumothorax. No acute osseous abnormality is identified.  IMPRESSION: No evidence of acute airspace disease.   Electronically Signed   By: Logan Bores   On: 12/31/2014 07:59     EKG Interpretation   Date/Time:  Saturday December 31 2014 07:07:38 EDT Ventricular Rate:  79 PR Interval:  177 QRS Duration: 89 QT Interval:  432 QTC Calculation: 495 R Axis:   -13 Text Interpretation:  Sinus rhythm Nonspecific T abnormalities, lateral  leads Borderline prolonged QT interval No significant change was found  Confirmed by [facility]  MD, TREY ([id number]) on 12/31/2014 7:14:38 AM      MDM   Final diagnoses:  SOB (shortness of breath)    Patient reports no dialysis for past 8 days. Last dialyzed at [facility] after presenting to the emergency department. He has multiple visits to the emergency department since then which have  not resulted in him being emergently dialyzed. His exam is unremarkable. However, given time since dialysis, will check blood work, EKG, and chest x-ray.   Labs look similar to last ED visit.  He ambulated without hypoxia.  I don't think he needs emergent dialysis today.   I spoke with Dr. Mercy Moore regarding his presentation today and his frequent ED visits requesting dialysis.  He reported that pt unable to get dialyzed in [place] due to being fired from [employer].  It is very difficult to get any additional information from pt about this issue, but it appears that he has a dialysis center in [facility].  He says he has not been there in 4 weeks because he doesn't want to drive to [place] just to get dialyzed.  I advised him to continue dialysis at his current center until other arrangements could be made via his nephrology team.    Serita Grit, MD 12/31/14 [phone]

## 2015-01-02 ENCOUNTER — Inpatient Hospital Stay (HOSPITAL_COMMUNITY)
Admission: EM | Admit: 2015-01-02 | Discharge: 2015-01-03 | DRG: 682 | Disposition: A | Discharge status: Home / Self Care | Payer: Medicare Other | Attending: Family Medicine | Admitting: Family Medicine

## 2015-01-02 ENCOUNTER — Emergency Department (HOSPITAL_COMMUNITY): Payer: Medicare Other

## 2015-01-02 ENCOUNTER — Encounter (HOSPITAL_COMMUNITY): Payer: Self-pay | Admitting: Emergency Medicine

## 2015-01-02 DIAGNOSIS — Z862 Personal history of diseases of the blood and blood-forming organs and certain disorders involving the immune mechanism: Secondary | ICD-10-CM | POA: Diagnosis not present

## 2015-01-02 DIAGNOSIS — Z87891 Personal history of nicotine dependence: Secondary | ICD-10-CM | POA: Diagnosis not present

## 2015-01-02 DIAGNOSIS — I2782 Chronic pulmonary embolism: Secondary | ICD-10-CM | POA: Diagnosis present

## 2015-01-02 DIAGNOSIS — Z91199 Patient's noncompliance with other medical treatment and regimen due to unspecified reason: Secondary | ICD-10-CM

## 2015-01-02 DIAGNOSIS — D649 Anemia, unspecified: Secondary | ICD-10-CM

## 2015-01-02 DIAGNOSIS — F329 Major depressive disorder, single episode, unspecified: Secondary | ICD-10-CM | POA: Diagnosis present

## 2015-01-02 DIAGNOSIS — E875 Hyperkalemia: Secondary | ICD-10-CM | POA: Diagnosis not present

## 2015-01-02 DIAGNOSIS — D631 Anemia in chronic kidney disease: Secondary | ICD-10-CM | POA: Diagnosis present

## 2015-01-02 DIAGNOSIS — Z9119 Patient's noncompliance with other medical treatment and regimen: Secondary | ICD-10-CM

## 2015-01-02 DIAGNOSIS — Z8249 Family history of ischemic heart disease and other diseases of the circulatory system: Secondary | ICD-10-CM | POA: Diagnosis not present

## 2015-01-02 DIAGNOSIS — N186 End stage renal disease: Secondary | ICD-10-CM

## 2015-01-02 DIAGNOSIS — E785 Hyperlipidemia, unspecified: Secondary | ICD-10-CM | POA: Diagnosis not present

## 2015-01-02 DIAGNOSIS — I10 Essential (primary) hypertension: Secondary | ICD-10-CM | POA: Diagnosis present

## 2015-01-02 DIAGNOSIS — Z86718 Personal history of other venous thrombosis and embolism: Secondary | ICD-10-CM | POA: Diagnosis not present

## 2015-01-02 DIAGNOSIS — Z9115 Patient's noncompliance with renal dialysis: Secondary | ICD-10-CM | POA: Diagnosis present

## 2015-01-02 DIAGNOSIS — Z79899 Other long term (current) drug therapy: Secondary | ICD-10-CM | POA: Diagnosis not present

## 2015-01-02 DIAGNOSIS — I509 Heart failure, unspecified: Secondary | ICD-10-CM | POA: Diagnosis not present

## 2015-01-02 DIAGNOSIS — I5042 Chronic combined systolic (congestive) and diastolic (congestive) heart failure: Secondary | ICD-10-CM | POA: Diagnosis present

## 2015-01-02 DIAGNOSIS — E1159 Type 2 diabetes mellitus with other circulatory complications: Secondary | ICD-10-CM | POA: Diagnosis present

## 2015-01-02 DIAGNOSIS — Z7901 Long term (current) use of anticoagulants: Secondary | ICD-10-CM | POA: Diagnosis not present

## 2015-01-02 DIAGNOSIS — E1122 Type 2 diabetes mellitus with diabetic chronic kidney disease: Secondary | ICD-10-CM | POA: Diagnosis present

## 2015-01-02 DIAGNOSIS — Z885 Allergy status to narcotic agent status: Secondary | ICD-10-CM | POA: Diagnosis not present

## 2015-01-02 DIAGNOSIS — E8770 Fluid overload, unspecified: Secondary | ICD-10-CM | POA: Diagnosis present

## 2015-01-02 DIAGNOSIS — Z940 Kidney transplant status: Secondary | ICD-10-CM | POA: Diagnosis not present

## 2015-01-02 DIAGNOSIS — R079 Chest pain, unspecified: Secondary | ICD-10-CM | POA: Diagnosis present

## 2015-01-02 DIAGNOSIS — Z7952 Long term (current) use of systemic steroids: Secondary | ICD-10-CM | POA: Diagnosis not present

## 2015-01-02 DIAGNOSIS — D638 Anemia in other chronic diseases classified elsewhere: Secondary | ICD-10-CM | POA: Diagnosis not present

## 2015-01-02 DIAGNOSIS — Z888 Allergy status to other drugs, medicaments and biological substances status: Secondary | ICD-10-CM | POA: Diagnosis not present

## 2015-01-02 DIAGNOSIS — I120 Hypertensive chronic kidney disease with stage 5 chronic kidney disease or end stage renal disease: Secondary | ICD-10-CM | POA: Diagnosis present

## 2015-01-02 DIAGNOSIS — Z992 Dependence on renal dialysis: Secondary | ICD-10-CM | POA: Diagnosis not present

## 2015-01-02 DIAGNOSIS — I82401 Acute embolism and thrombosis of unspecified deep veins of right lower extremity: Secondary | ICD-10-CM | POA: Diagnosis not present

## 2015-01-02 DIAGNOSIS — F419 Anxiety disorder, unspecified: Secondary | ICD-10-CM | POA: Diagnosis not present

## 2015-01-02 DIAGNOSIS — E119 Type 2 diabetes mellitus without complications: Secondary | ICD-10-CM | POA: Diagnosis not present

## 2015-01-02 DIAGNOSIS — Z792 Long term (current) use of antibiotics: Secondary | ICD-10-CM | POA: Diagnosis not present

## 2015-01-02 LAB — COMPREHENSIVE METABOLIC PANEL
ALT: 14 U/L — ABNORMAL LOW (ref 17–63)
AST: 53 U/L — AB (ref 15–41)
Albumin: 2.2 g/dL — ABNORMAL LOW (ref 3.5–5.0)
Alkaline Phosphatase: 64 U/L (ref 38–126)
Anion gap: 15 (ref 5–15)
BUN: 94 mg/dL — ABNORMAL HIGH (ref 6–20)
CALCIUM: 8 mg/dL — AB (ref 8.9–10.3)
CHLORIDE: 105 mmol/L (ref 101–111)
CO2: 19 mmol/L — AB (ref 22–32)
CREATININE: 25.82 mg/dL — AB (ref 0.61–1.24)
GFR calc Af Amer: 2 mL/min — ABNORMAL LOW (ref >60)
GFR calc non Af Amer: 2 mL/min — ABNORMAL LOW (ref >60)
Glucose, Bld: 96 mg/dL (ref 65–99)
POTASSIUM: 5.6 mmol/L — AB (ref 3.5–5.1)
Sodium: 139 mmol/L (ref 135–145)
Total Bilirubin: 0.5 mg/dL (ref 0.3–1.2)
Total Protein: 6.7 g/dL (ref 6.5–8.1)

## 2015-01-02 LAB — CBC
HEMATOCRIT: 23 % — AB (ref 39.0–52.0)
Hemoglobin: 7.4 g/dL — ABNORMAL LOW (ref 13.0–17.0)
MCH: 26 pg (ref 26.0–34.0)
MCHC: 32.2 g/dL (ref 30.0–36.0)
MCV: 80.7 fL (ref 78.0–100.0)
Platelets: 268 10*3/uL (ref 150–400)
RBC: 2.85 MIL/uL — ABNORMAL LOW (ref 4.22–5.81)
RDW: 14.6 % (ref 11.5–15.5)
WBC: 5.9 10*3/uL (ref 4.0–10.5)

## 2015-01-02 LAB — MRSA PCR SCREENING: MRSA by PCR: NEGATIVE

## 2015-01-02 LAB — PREPARE RBC (CROSSMATCH)

## 2015-01-02 MED ORDER — SODIUM CHLORIDE 0.9 % IJ SOLN
3.0000 mL | Freq: Two times a day (BID) | INTRAMUSCULAR | Status: DC
  Administered 2015-01-03 (×2): 3 mL via INTRAVENOUS

## 2015-01-02 MED ORDER — LORAZEPAM 0.5 MG PO TABS
0.5000 mg | ORAL_TABLET | Freq: Every evening | ORAL | Status: DC | PRN
Start: 2015-01-02 — End: 2015-01-03

## 2015-01-02 MED ORDER — LEVALBUTEROL HCL 0.63 MG/3ML IN NEBU: 0.6300 mg | INHALATION_SOLUTION | Freq: Four times a day (QID) | RESPIRATORY_TRACT | Status: DC | PRN

## 2015-01-02 MED ORDER — CINACALCET HCL 30 MG PO TABS
30.0000 mg | ORAL_TABLET | Freq: Every day | ORAL | Status: DC
  Administered 2015-01-03: 30 mg via ORAL
  Filled 2015-01-02 (×2): qty 1

## 2015-01-02 MED ORDER — HYDRALAZINE HCL 25 MG PO TABS
25.0000 mg | ORAL_TABLET | Freq: Three times a day (TID) | ORAL | Status: DC
  Administered 2015-01-02 – 2015-01-03 (×2): 25 mg via ORAL
  Filled 2015-01-02 (×6): qty 1

## 2015-01-02 MED ORDER — SODIUM CHLORIDE 0.9 % IV SOLN: INTRAVENOUS | Status: DC

## 2015-01-02 MED ORDER — METHOCARBAMOL 500 MG PO TABS
500.0000 mg | ORAL_TABLET | Freq: Three times a day (TID) | ORAL | Status: DC | PRN
  Administered 2015-01-02 – 2015-01-03 (×3): 500 mg via ORAL
  Filled 2015-01-02 (×3): qty 1

## 2015-01-02 MED ORDER — DARBEPOETIN ALFA 200 MCG/0.4ML IJ SOSY
200.0000 ug | PREFILLED_SYRINGE | INTRAMUSCULAR | Status: DC
  Administered 2015-01-03: 200 ug via INTRAVENOUS
  Filled 2015-01-02: qty 0.4

## 2015-01-02 MED ORDER — SODIUM CHLORIDE 0.9 % IV SOLN: Freq: Once | INTRAVENOUS | Status: DC

## 2015-01-02 MED ORDER — DICYCLOMINE HCL 20 MG PO TABS
20.0000 mg | ORAL_TABLET | Freq: Two times a day (BID) | ORAL | Status: DC
  Administered 2015-01-02 – 2015-01-03 (×2): 20 mg via ORAL
  Filled 2015-01-02 (×3): qty 1

## 2015-01-02 MED ORDER — PREDNISONE 5 MG PO TABS
5.0000 mg | ORAL_TABLET | Freq: Every day | ORAL | Status: DC
  Administered 2015-01-03: 5 mg via ORAL
  Filled 2015-01-02 (×2): qty 1

## 2015-01-02 MED ORDER — PANTOPRAZOLE SODIUM 40 MG PO TBEC
40.0000 mg | DELAYED_RELEASE_TABLET | Freq: Every day | ORAL | Status: DC
  Administered 2015-01-02 – 2015-01-03 (×2): 40 mg via ORAL
  Filled 2015-01-02 (×2): qty 1

## 2015-01-02 MED ORDER — DOXERCALCIFEROL 4 MCG/2ML IV SOLN
2.5000 ug | INTRAVENOUS | Status: DC
  Administered 2015-01-03: 2.5 ug via INTRAVENOUS
  Filled 2015-01-02: qty 2

## 2015-01-02 MED ORDER — HYDRALAZINE HCL 20 MG/ML IJ SOLN: 10.0000 mg | Freq: Four times a day (QID) | INTRAMUSCULAR | Status: DC | PRN

## 2015-01-02 MED ORDER — ATORVASTATIN CALCIUM 40 MG PO TABS
40.0000 mg | ORAL_TABLET | Freq: Every day | ORAL | Status: DC
  Administered 2015-01-02: 40 mg via ORAL
  Filled 2015-01-02 (×2): qty 1

## 2015-01-02 MED ORDER — WARFARIN SODIUM 4 MG PO TABS
4.0000 mg | ORAL_TABLET | Freq: Once | ORAL | Status: AC
  Administered 2015-01-03: 4 mg via ORAL
  Filled 2015-01-02: qty 1

## 2015-01-02 MED ORDER — CARVEDILOL 25 MG PO TABS
25.0000 mg | ORAL_TABLET | Freq: Two times a day (BID) | ORAL | Status: DC
  Administered 2015-01-02 – 2015-01-03 (×2): 25 mg via ORAL
  Filled 2015-01-02 (×4): qty 1

## 2015-01-02 MED ORDER — ONDANSETRON HCL 4 MG/2ML IJ SOLN
4.0000 mg | Freq: Four times a day (QID) | INTRAMUSCULAR | Status: DC | PRN
  Administered 2015-01-02 – 2015-01-03 (×2): 4 mg via INTRAVENOUS
  Filled 2015-01-02 (×2): qty 2

## 2015-01-02 MED ORDER — DOCUSATE SODIUM 100 MG PO CAPS
100.0000 mg | ORAL_CAPSULE | Freq: Two times a day (BID) | ORAL | Status: DC
  Administered 2015-01-02 – 2015-01-03 (×2): 100 mg via ORAL
  Filled 2015-01-02 (×3): qty 1

## 2015-01-02 MED ORDER — WARFARIN - PHARMACIST DOSING INPATIENT: Freq: Every day | Status: DC

## 2015-01-02 MED ORDER — ONDANSETRON HCL 4 MG PO TABS: 4.0000 mg | ORAL_TABLET | Freq: Four times a day (QID) | ORAL | Status: DC | PRN

## 2015-01-02 NOTE — ED Notes (Signed)
Pt has egg biscuit from [employer] Patty at bedside that he brought with him prior to today's ER visit. This RN heated it up in the microwave for patient and gave pt coca cola.

## 2015-01-02 NOTE — Progress Notes (Signed)
Patient refused to have viral signs taken and lab draw tonight. Ignacia Gentzler, [name], Therapist, sports

## 2015-01-02 NOTE — Progress Notes (Signed)
Patient refused to have vital signs be checked,pt informed that his blood pressure have been high,pt states "I don't care". Patient very irritable and doesn't want to be bothered,refused assessment. Cheyne Bungert, [name], Therapist, sports

## 2015-01-02 NOTE — H&P (Signed)
[facility] History and Physical  ADIL TUGWELL FWY:[id number] DOB: [birth year] DOA: 01/02/2015  Referring physician: ER physician:Dr. Quintella Reichert PCP: Angelica Chessman, MD  Chief Complaint: weakness, shortness of breath   HPI:  49 year old male with past medical history of ESRD on HD, very much non compliant with HD and medical therapy, visits ED PRN for HD, chronic combined systolic and diastolic CHF( last 2 D ECHO in 11/2014 with EF of 35% and grade 2 diastolic dysfunction), hypertension, PE and DVT on anticoagulation with coumadin. He presented to [facility] ED with reports of feeling weak and short of breath over past few days prior to this admission and thinks he needs dialysis. Last HD was about 10 days ago. Pt has peritoneal catheter which was placed in anticipation that he will eventually do PD at home but this has not yet happened. No chest pain, no palpitations. No abdominal pain, nausea or vomiting. No fevers or chills. No falls. No loss of consciousness.  In ED, BP was 192/135 and then 142/99. He was afebrile. Blood work showed hemoglobin of 8 and then 7.4. Creatinine was 21.9, potassium 5.4, CXR showed no acute cardiopulmonary findings. He was admitted for further management of shortness of breath. Nephrology has seen in consultation.    Assessment & Plan    Principal Problem: Volume overload / ESRD on HD / Noncompliance with medical therapy / Chronic combined systolic and diastolic CHF (congestive heart failure) - Pt volume overloaded on physical exam. He needs HD. Apprecaite renal seeing the pt in consultation. - Per renal will do iHD on 01/03/15 and to have 2 units PRBC transfused with HD - Last 2 D ECHO in 11/2014 with EF of 35% and grade 2 diastolic dysfunction   Active Problems: Hyperkalemia - Likely due to need for HD, missed HD and has not started PD - Will have iHD on 6/14 - Follow up blood work tomorrow am   Accelerated hypertension - Continue carvedilol and add  hydralazine 25 mg PO Q 8 hours in addition to PRN hydralazine if BP stays above 150/90  Chronic pulmonary embolism / DVT (deep venous thrombosis) - Continue coumadin, pharmacy dosing   Anemia of chronic disease - Secondary to ESRD - Will get 2 units PRBC transfusion tomorrow with HD  Dyslipidemia - Resume statin therapy   DVT prophylaxis:  - ON full dose anticoagulation   Radiological Exams on Admission: Dg Chest Port 1 View 01/02/2015   No evidence of acute cardiopulmonary disease.   Electronically Signed   By: Julian Hy M.D.   On: 01/02/2015 13:29    EKG: I have personally reviewed EKG. EKG shows sinus rhythm   Code Status: Full Family Communication: Plan of care discussed with the patient  Disposition Plan: Admit for further evaluation  Leisa Lenz, MD  [facility] Pager [phone]  Time spent in minutes: 75 minutes  Review of Systems:  Constitutional: Negative for fever, chills and malaise/fatigue. Negative for diaphoresis.  HENT: Negative for hearing loss, ear pain, nosebleeds, congestion, sore throat, neck pain, tinnitus and ear discharge.   Eyes: Negative for blurred vision, double vision, photophobia, pain, discharge and redness.  Respiratory: Negative for cough, hemoptysis, sputum production, positive for shortness of breath, no wheezing and stridor.   Cardiovascular: Negative for chest pain, palpitations, orthopnea, claudication and leg swelling.  Gastrointestinal: Negative for nausea, vomiting and abdominal pain. Negative for heartburn, constipation, blood in stool and melena.  Genitourinary: Negative for dysuria, urgency, frequency, hematuria and flank pain.  Musculoskeletal: Negative for myalgias, back pain, joint pain and falls.  Skin: Negative for itching and rash.  Neurological: Negative for dizziness. Negative for tingling, tremors, sensory change, speech change, focal weakness, loss of consciousness and headaches.  Endo/Heme/Allergies: Negative  for environmental allergies and polydipsia. Does not bruise/bleed easily.  Psychiatric/Behavioral: Negative for suicidal ideas. The patient is not nervous/anxious.      Past Medical History  Diagnosis Date  . Hypertension   . Depression   . Complication of anesthesia     itching, sore throat  . Diabetes mellitus without complication     No history per patient, but remains under history as A1c would not be accurate given on dialysis  . Shortness of breath   . Anxiety   . ESRD (end stage renal disease)     due to HTN per patient, followed at [facility], s/p failed kidney transplant - dialysis Tue, Th, Sat  . Renal insufficiency   . CHF (congestive heart failure)   . Anemia   . Dialysis patient    Past Surgical History  Procedure Laterality Date  . Kidney receipient  2006    failed and started HD in March 2014  . Capd insertion    . Capd removal    . Left heart catheterization with coronary angiogram N/A 09/02/2014    Procedure: LEFT HEART CATHETERIZATION WITH CORONARY ANGIOGRAM;  Surgeon: [name], MD;  Location: [facility] CATH LAB;  Service: Cardiovascular;  Laterality: N/A;   Social History:  reports that he has quit smoking. His smoking use included Cigarettes. He has a 1 pack-year smoking history. He has never used smokeless tobacco. He reports that he does not drink alcohol or use illicit drugs.  Allergies  Allergen Reactions  . Ferrlecit [Na Ferric Gluc Cplx In Sucrose] Shortness Of Breath, Swelling and Other (See Comments)    Swelling in throat  . Darvocet [Propoxyphene N-Acetaminophen] Hives    Family History: HTN in parents   Prior to Admission medications   Medication Sig Start Date End Date Taking? Authorizing Provider  atorvastatin (LIPITOR) 40 MG tablet Take 1 tablet (40 mg total) by mouth daily at 6 PM. 09/02/14  Yes Barton Dubois, MD  carvedilol (COREG) 25 MG tablet Take 1 tablet (25 mg total) by mouth 2 (two) times daily with a meal. 10/10/14  Yes [name],  DO  cinacalcet (SENSIPAR) 30 MG tablet Take 30 mg by mouth daily.   Yes Historical Provider, MD  diclofenac sodium (VOLTAREN) 1 % GEL Apply 2 g topically 4 (four) times daily. 11/14/14  Yes Tresa Garter, MD  dicyclomine (BENTYL) 20 MG tablet Take 1 tablet (20 mg total) by mouth 2 (two) times daily. 12/20/14  Yes Ernestina Patches, MD  docusate sodium (COLACE) 100 MG capsule Take 1 capsule (100 mg total) by mouth 2 (two) times daily. 11/19/14  Yes [name], MD  doxercalciferol (HECTOROL) 4 MCG/2ML injection Inject 1.25 mLs (2.5 mcg total) into the vein Every Tuesday,Thursday,and Saturday with dialysis. 10/10/14  Yes [name], DO  levalbuterol (XOPENEX HFA) 45 MCG/ACT inhaler Inhale 2 puffs into the lungs every 6 (six) hours as needed for wheezing or shortness of breath. 11/19/14  Yes [name], MD  lisinopril (PRINIVIL,ZESTRIL) 20 MG tablet Take 1 tablet (20 mg total) by mouth daily. 12/05/14  Yes Verlee Monte, MD  LORazepam (ATIVAN) 0.5 MG tablet Take 1 tablet (0.5 mg total) by mouth at bedtime as needed for anxiety. 11/19/14  Yes Ripudeep K  Rai, MD  methocarbamol (ROBAXIN) 500 MG tablet Take 1 tablet (500 mg total) by mouth every 8 (eight) hours as needed for muscle spasms. 12/29/14  Yes Leonard Schwartz, MD  omeprazole (PRILOSEC) 20 MG capsule Take 20 mg by mouth daily. 07/01/13  Yes Historical Provider, MD  ondansetron (ZOFRAN) 4 MG tablet Take 1 tablet (4 mg total) by mouth every 6 (six) hours. 12/20/14  Yes Ernestina Patches, MD  ondansetron (ZOFRAN-ODT) 4 MG disintegrating tablet Take 1 tablet (4 mg total) by mouth every 8 (eight) hours as needed for nausea or vomiting. 12/05/14  Yes Verlee Monte, MD  predniSONE (DELTASONE) 5 MG tablet Take 5 mg by mouth daily with breakfast.   Yes Historical Provider, MD  sodium polystyrene (KAYEXALATE) powder Take by mouth once. Take 30g by mouth one time. 12/31/14  Yes Serita Grit, MD  warfarin (COUMADIN) 4 MG tablet Take 1 tablet (4 mg total) by mouth  daily at 6 PM. 12/05/14  Yes Verlee Monte, MD  ciprofloxacin (CIPRO) 500 MG tablet Take 1 tablet (500 mg total) by mouth daily. Patient not taking: Reported on 12/20/2014 12/16/14   Larene Pickett, PA-C  enoxaparin (LOVENOX) 80 MG/0.8ML injection Inject 0.8 mLs (80 mg total) into the skin daily. Patient not taking: Reported on 12/26/2014 12/16/14   Ezequiel Essex, MD   Physical Exam: Filed Vitals:   01/02/15 1400 01/02/15 1445 01/02/15 1547 01/02/15 1601  BP: 176/124 167/110 192/135 180/113  Pulse: 82 85 88 85  Temp:   98.1 F (36.7 C) 97.8 F (36.6 C)  TempSrc:   Oral Oral  Resp: _0 Height:      Weight:      SpO2: 97% 98% 100% 100%    Physical Exam  Constitutional: Appears well-developed and well-nourished. No distress.  HENT: Normocephalic. No tonsillar erythema or exudates Eyes: Conjunctivae are normal. No scleral icterus.  Neck: Normal ROM. Neck supple. No JVD. No tracheal deviation. No thyromegaly.  CVS: RRR, S1/S2 appreciated   Pulmonary: Effort and breath sounds normal, no stridor, rhonchi, wheezes, rales.  Abdominal: Soft. BS +,  no distension, tenderness, rebound or guarding.  Musculoskeletal: Normal range of motion. +1 LE pitting edema and no tenderness.  Lymphadenopathy: No lymphadenopathy noted, cervical, inguinal. Neuro: Alert. Normal reflexes, muscle tone coordination. No focal neurologic deficits. Skin: Skin is warm and dry. No rash noted.  No erythema. No pallor.  Psychiatric: Normal mood and affect. Behavior, judgment, thought content normal.   Labs on Admission:  Basic Metabolic Panel:  Recent Labs Lab 12/26/14 2254 12/28/14 1958 12/29/14 2138 12/31/14 0750 01/02/15 1406  NA 136 135 134* 137 139  K 4.9 5.3* 5.6* 5.4* 5.6*  CL 100* 100* 99* 100* 105  CO2 24 20* 20* 19* 19*  GLUCOSE 72 99 104* 111* 96  BUN 50* 60* 66* 80* 94*  CREATININE 15.65* 17.88* 19.61* 21.94* 25.82*  CALCIUM 7.8* 7.7* 7.7* 8.0* 8.0*   Liver Function Tests:  Recent  Labs Lab 01/02/15 1406  AST 53*  ALT 14*  ALKPHOS 64  BILITOT 0.5  PROT 6.7  ALBUMIN 2.2*   No results for input(s): LIPASE, AMYLASE in the last 168 hours. No results for input(s): AMMONIA in the last 168 hours. CBC:  Recent Labs Lab 12/31/14 0750 01/02/15 1406  WBC 6.9 5.9  HGB 8.0* 7.4*  HCT 24.9* 23.0*  MCV 81.1 80.7  PLT 328 268   Cardiac Enzymes: No results for input(s): CKTOTAL, CKMB, CKMBINDEX, TROPONINI in the last 168  hours. BNP: Invalid input(s): POCBNP CBG: No results for input(s): GLUCAP in the last 168 hours.  If 7PM-7AM, please contact night-coverage [email or web address] Password [email or web address] 01/02/2015, 4:12 PM

## 2015-01-02 NOTE — Progress Notes (Addendum)
ANTICOAGULATION CONSULT NOTE - Initial Consult  Pharmacy Consult for Warfarin Indication: pulmonary embolus  Allergies  Allergen Reactions  . Ferrlecit [Na Ferric Gluc Cplx In Sucrose] Shortness Of Breath, Swelling and Other (See Comments)    Swelling in throat  . Darvocet [Propoxyphene N-Acetaminophen] Hives    Patient Measurements: Height: _0  (185.4 cm) Weight: 177 lb (80.287 kg) IBW/kg (Calculated) : 79.9 Heparin Dosing Weight:   Vital Signs: Temp: 97.8 F (36.6 C) (06/13 1601) Temp Source: Oral (06/13 1601) BP: 180/113 mmHg (06/13 1601) Pulse Rate: 85 (06/13 1601)  Labs:  Recent Labs  12/31/14 0750 01/02/15 1406  HGB 8.0* 7.4*  HCT 24.9* 23.0*  PLT 328 268  CREATININE 21.94* 25.82*    Estimated Creatinine Clearance: 3.9 mL/min (by C-G formula based on Cr of 25.82).   Medical History: Past Medical History  Diagnosis Date  . Hypertension   . Depression   . Complication of anesthesia     itching, sore throat  . Diabetes mellitus without complication     No history per patient, but remains under history as A1c would not be accurate given on dialysis  . Shortness of breath   . Anxiety   . ESRD (end stage renal disease)     due to HTN per patient, followed at [facility], s/p failed kidney transplant - dialysis Tue, Th, Sat  . Renal insufficiency   . CHF (congestive heart failure)   . Anemia   . Dialysis patient     Medications:  Prescriptions prior to admission  Medication Sig Dispense Refill Last Dose  . atorvastatin (LIPITOR) 40 MG tablet Take 1 tablet (40 mg total) by mouth daily at 6 PM. 30 tablet 1 01/01/2015 at Unknown time  . carvedilol (COREG) 25 MG tablet Take 1 tablet (25 mg total) by mouth 2 (two) times daily with a meal.   01/01/2015 at 18:00  . cinacalcet (SENSIPAR) 30 MG tablet Take 30 mg by mouth daily.   01/01/2015 at Unknown time  . diclofenac sodium (VOLTAREN) 1 % GEL Apply 2 g topically 4 (four) times daily. 2 Tube 2 01/01/2015 at Unknown  time  . dicyclomine (BENTYL) 20 MG tablet Take 1 tablet (20 mg total) by mouth 2 (two) times daily. 10 tablet 0 01/01/2015 at Unknown time  . docusate sodium (COLACE) 100 MG capsule Take 1 capsule (100 mg total) by mouth 2 (two) times daily. 60 capsule 0 01/01/2015 at Unknown time  . doxercalciferol (HECTOROL) 4 MCG/2ML injection Inject 1.25 mLs (2.5 mcg total) into the vein Every Tuesday,Thursday,and Saturday with dialysis. 2 mL  Past Month at Unknown time  . levalbuterol (XOPENEX HFA) 45 MCG/ACT inhaler Inhale 2 puffs into the lungs every 6 (six) hours as needed for wheezing or shortness of breath. 1 Inhaler 2 Past Week at Unknown time  . lisinopril (PRINIVIL,ZESTRIL) 20 MG tablet Take 1 tablet (20 mg total) by mouth daily. 30 tablet 0 01/01/2015 at Unknown time  . LORazepam (ATIVAN) 0.5 MG tablet Take 1 tablet (0.5 mg total) by mouth at bedtime as needed for anxiety. 15 tablet 0 Past Week at Unknown time  . methocarbamol (ROBAXIN) 500 MG tablet Take 1 tablet (500 mg total) by mouth every 8 (eight) hours as needed for muscle spasms. 30 tablet 0 Past Week at Unknown time  . omeprazole (PRILOSEC) 20 MG capsule Take 20 mg by mouth daily.   01/01/2015 at Unknown time  . ondansetron (ZOFRAN) 4 MG tablet Take 1 tablet (4 mg total) by mouth  every 6 (six) hours. 12 tablet 0 01/01/2015 at Unknown time  . ondansetron (ZOFRAN-ODT) 4 MG disintegrating tablet Take 1 tablet (4 mg total) by mouth every 8 (eight) hours as needed for nausea or vomiting. 20 tablet 0 Past Month at Unknown time  . predniSONE (DELTASONE) 5 MG tablet Take 5 mg by mouth daily with breakfast.   01/01/2015 at Unknown time  . sodium polystyrene (KAYEXALATE) powder Take by mouth once. Take 30g by mouth one time. 30 g 0 01/01/2015 at Unknown time  . warfarin (COUMADIN) 4 MG tablet Take 1 tablet (4 mg total) by mouth daily at 6 PM. 30 tablet 0 01/01/2015 at Unknown time  . ciprofloxacin (CIPRO) 500 MG tablet Take 1 tablet (500 mg total) by mouth daily.  (Patient not taking: Reported on 12/20/2014) 10 tablet 0 Not Taking at Unknown time  . enoxaparin (LOVENOX) 80 MG/0.8ML injection Inject 0.8 mLs (80 mg total) into the skin daily. (Patient not taking: Reported on 12/26/2014) 5 Syringe 0 Not Taking at Unknown time   Scheduled:  . sodium chloride   Intravenous Once  . atorvastatin  40 mg Oral q1800  . carvedilol  25 mg Oral BID WC  . [START ON 01/03/2015] cinacalcet  30 mg Oral Q breakfast  . [START ON 01/03/2015] darbepoetin (ARANESP) injection - DIALYSIS  200 mcg Intravenous Q Tue-HD  . dicyclomine  20 mg Oral BID  . docusate sodium  100 mg Oral BID  . [START ON 01/03/2015] doxercalciferol  2.5 mcg Intravenous Q T,Th,Sa-HD  . hydrALAZINE  25 mg Oral 3 times per day  . pantoprazole  40 mg Oral Daily  . [START ON 01/03/2015] predniSONE  5 mg Oral Q breakfast  . sodium chloride  3 mL Intravenous Q12H   Infusions:  . sodium chloride      Assessment: 49yo male with history of DVT/PE, CHF, ESRD on HD TTS, DM2, and HTN presents with weakness and SOB. Pharmacy is consulted to dose warfarin for history of VTE. Hgb 7.4, Plt 268, sCr 26, INR refused.  PTA Warfarin Dose: 58m/d with last dose taken 01/01/15.  Goal of Therapy:  INR 2-3 Monitor platelets by anticoagulation protocol: Yes   Plan:  Warfarin 413mPO tonight x1 Patient refused INR, will check in the morning Daily INR/CBC Monitor s/sx of bleeding  CoAndrey CotaBaDiona FoleyPharmD Clinical Pharmacist Pager [phone]/13/2016,8:45 PM

## 2015-01-02 NOTE — Progress Notes (Signed)
New Admission Note:   Arrival Method: Stretcher  Mental Orientation:Alert and oriented  Telemetry: NO new orders Assessment: Completed Skin: See doc flowsheets  IV: Saline locked  Pain: none  Tubes: none  Safety Measures: Safety Fall Prevention Plan has been given, discussed and signed Admission: Completed [facility] [place] Orientation: Patient has been orientated to the room, unit and staff.  Family: None at bedside   Orders have been reviewed and implemented. Will continue to monitor the patient. Call light has been placed within reach and bed alarm has been activated.   Alcide Evener BSN, RN Phone number: [phone]

## 2015-01-02 NOTE — Consult Note (Signed)
Frank [name] 01/02/2015 Rexene Agent Requesting Physician:  Ralene Bathe MD  Reason for Consult:  Anemia, Hyperkalemia, Co-Management of ESRD HPI:  65M seen at request of Dr. Ralene Bathe for evaluation of anemia, hyperkalemia, and ESRD.  Pt currently a patient of [facility].  He has a PD catheter and was training to do home PD but has not done so for very unclear reasons.  He instead comes to [facility] several times a week for HD.  Some of this is documented in 4/28 consult note.  He came today with weakness and fatigue.  Labs as below.  He still wishes to transfer into [place] but has not been accepted by any kidney center.   Filed Weights   01/02/15 1058  Weight: 80.287 kg (177 lb)       ROS Balance of 12 systems is negative w/ exceptions as above  Outpt HD Orders No consistent HD  PMH  Past Medical History  Diagnosis Date  . Hypertension   . Depression   . Complication of anesthesia     itching, sore throat  . Diabetes mellitus without complication     No history per patient, but remains under history as A1c would not be accurate given on dialysis  . Shortness of breath   . Anxiety   . ESRD (end stage renal disease)     due to HTN per patient, followed at [facility], s/p failed kidney transplant - dialysis Tue, Th, Sat  . Renal insufficiency   . CHF (congestive heart failure)   . Anemia   . Dialysis patient    [place]  Past Surgical History  Procedure Laterality Date  . Kidney receipient  2006    failed and started HD in March 2014  . Capd insertion    . Capd removal    . Left heart catheterization with coronary angiogram N/A 09/02/2014    Procedure: LEFT HEART CATHETERIZATION WITH CORONARY ANGIOGRAM;  Surgeon: [name], MD;  Location: [facility] C[facility] LAB;  Service: Cardiovascular;  Laterality: N/A;   FH No family history on file. SH  reports that he has quit smoking. His smoking use included Cigarettes. He has a 1 pack-year smoking history. He has never used smokeless tobacco. He  reports that he does not drink alcohol or use illicit drugs. Allergies  Allergies  Allergen Reactions  . Ferrlecit [Na Ferric Gluc Cplx In Sucrose] Shortness Of Breath, Swelling and Other (See Comments)    Swelling in throat  . Darvocet [Propoxyphene N-Acetaminophen] Hives   Home medications Prior to Admission medications   Medication Sig Start Date End Date Taking? Authorizing Provider  atorvastatin (LIPITOR) 40 MG tablet Take 1 tablet (40 mg total) by mouth daily at 6 PM. 09/02/14  Yes Barton Dubois, MD  carvedilol (COREG) 25 MG tablet Take 1 tablet (25 mg total) by mouth 2 (two) times daily with a meal. 10/10/14  Yes [name], DO  cinacalcet (SENSIPAR) 30 MG tablet Take 30 mg by mouth daily.   Yes Historical Provider, MD  diclofenac sodium (VOLTAREN) 1 % GEL Apply 2 g topically 4 (four) times daily. 11/14/14  Yes Tresa Garter, MD  dicyclomine (BENTYL) 20 MG tablet Take 1 tablet (20 mg total) by mouth 2 (two) times daily. 12/20/14  Yes Ernestina Patches, MD  docusate sodium (COLACE) 100 MG capsule Take 1 capsule (100 mg total) by mouth 2 (two) times daily. 11/19/14  Yes [name], MD  doxercalciferol (HECTOROL) 4 MCG/2ML injection Inject 1.25 mLs (  2.5 mcg total) into the vein Every Tuesday,Thursday,and Saturday with dialysis. 10/10/14  Yes [name], DO  levalbuterol (XOPENEX HFA) 45 MCG/ACT inhaler Inhale 2 puffs into the lungs every 6 (six) hours as needed for wheezing or shortness of breath. 11/19/14  Yes [name], MD  lisinopril (PRINIVIL,ZESTRIL) 20 MG tablet Take 1 tablet (20 mg total) by mouth daily. 12/05/14  Yes Verlee Monte, MD  LORazepam (ATIVAN) 0.5 MG tablet Take 1 tablet (0.5 mg total) by mouth at bedtime as needed for anxiety. 11/19/14  Yes [name], MD  methocarbamol (ROBAXIN) 500 MG tablet Take 1 tablet (500 mg total) by mouth every 8 (eight) hours as needed for muscle spasms. 12/29/14  Yes Leonard Schwartz, MD  omeprazole (PRILOSEC) 20 MG capsule Take 20 mg  by mouth daily. 07/01/13  Yes Historical Provider, MD  ondansetron (ZOFRAN) 4 MG tablet Take 1 tablet (4 mg total) by mouth every 6 (six) hours. 12/20/14  Yes Ernestina Patches, MD  ondansetron (ZOFRAN-ODT) 4 MG disintegrating tablet Take 1 tablet (4 mg total) by mouth every 8 (eight) hours as needed for nausea or vomiting. 12/05/14  Yes Verlee Monte, MD  predniSONE (DELTASONE) 5 MG tablet Take 5 mg by mouth daily with breakfast.   Yes Historical Provider, MD  sodium polystyrene (KAYEXALATE) powder Take by mouth once. Take 30g by mouth one time. 12/31/14  Yes Serita Grit, MD  warfarin (COUMADIN) 4 MG tablet Take 1 tablet (4 mg total) by mouth daily at 6 PM. 12/05/14  Yes Verlee Monte, MD  ciprofloxacin (CIPRO) 500 MG tablet Take 1 tablet (500 mg total) by mouth daily. Patient not taking: Reported on 12/20/2014 12/16/14   Larene Pickett, PA-C  enoxaparin (LOVENOX) 80 MG/0.8ML injection Inject 0.8 mLs (80 mg total) into the skin daily. Patient not taking: Reported on 12/26/2014 12/16/14   Ezequiel Essex, MD    Current Medications Scheduled Meds: . sodium chloride   Intravenous Once   Continuous Infusions:  PRN Meds:.  CBC  Recent Labs Lab 12/31/14 0750 01/02/15 1406  WBC 6.9 5.9  HGB 8.0* 7.4*  HCT 24.9* 23.0*  MCV 81.1 80.7  PLT 328 149   Basic Metabolic Panel  Recent Labs Lab 12/26/14 2254 12/28/14 1958 12/29/14 2138 12/31/14 0750 01/02/15 1406  NA 136 135 134* 137 139  K 4.9 5.3* 5.6* 5.4* 5.6*  CL 100* 100* 99* 100* 105  CO2 24 20* 20* 19* 19*  GLUCOSE 72 99 104* 111* 96  BUN 50* 60* 66* 80* 94*  CREATININE 15.65* 17.88* 19.61* 21.94* 25.82*  CALCIUM 7.8* 7.7* 7.7* 8.0* 8.0*    Physical Exam  Blood pressure 180/113, pulse 85, temperature 97.8 F (36.6 C), temperature source Oral, resp. rate 17, height _0  (1.854 m), weight 80.287 kg (177 lb), SpO2 100 %. GEN: awake, alert ENT: NCAT EYES: EOMI CV: RRR, no rub PULM: Clear bilaterally ABD: Soft, nontender,  nondistended.  SKIN: No rashes or lesions EXT: 1-2+ edema   A/P 1. ESRD on PD at [name] 1. Patient clearly has long-standing compliance problems 2. He will continue to receive kidney care [place] dialysis 3. He can arrange outpt HD with them 4. Will do iHD 6/14, transfuse 2u PRBC with HD 5. He is not a candidate for HD in [place] [facility] 2. Hyperkalemia 1. Mild, will resolve with HD 3. HTN/Vol: As above. Continue home blood pressure meds with the exception of lisinopril. 4. Anemia:  1. 2u PRBC tomorrow 2. Will give ESA  tomorrow 5. CKD-BMD: Cont cinacalcet and sevelamer  6. Pearson Grippe MD 01/02/2015, 4:21 PM

## 2015-01-02 NOTE — ED Notes (Signed)
Pt transported to restroom via wheel chair.

## 2015-01-02 NOTE — ED Notes (Signed)
Pt. Stated, no dialysis  Since last Friday, 10 days ago.  Felling weak and unable to walk.  Trying to get paper work right to have dialysis

## 2015-01-02 NOTE — ED Notes (Signed)
Pt ambulated back from the restroom, pt refusing to get back in bed and placed on cardiac monitor.

## 2015-01-02 NOTE — ED Notes (Signed)
Informed patient that he is being admitted into the hospital and this RN asked him to get into a gown and to sit on the stretcher but pt refused, stating that he wasn't going to get into a gown or lay on the stretcher "until I get something to eat and drink."

## 2015-01-02 NOTE — ED Provider Notes (Signed)
CSN: [id number]     Arrival date & time 01/02/15  1047 History   First MD Initiated Contact with Patient 01/02/15 1152     Chief Complaint  Patient presents with  . Weakness  . Shortness of Breath    Patient is a 49 y.o. male presenting with weakness and shortness of breath. The history is provided by the patient. No language interpreter was used.  Weakness Associated symptoms include shortness of breath.  Shortness of Breath  Frank [name] presents for evaluation of weakness.  He reports generalized weakness for the last two days, feels like he might pass out.  He started vomiting this morning, took a nausea medication this morning and vomited back up.  He reports he is 14 pounds over his dry weight.  His last dialysis was 11 days ago.  He states that he does not currently have a dialysis center.  Denies fevers.  He has mild SOB, swelling all over.  He has a permcath in his right chest that was placed in [facility] two years ago.  He is getting arranged for peritoneal dialysis but this is not set up yet.    Past Medical History  Diagnosis Date  . Hypertension   . Depression   . Complication of anesthesia     itching, sore throat  . Diabetes mellitus without complication     No history per patient, but remains under history as A1c would not be accurate given on dialysis  . Shortness of breath   . Anxiety   . ESRD (end stage renal disease)     due to HTN per patient, followed at [facility], s/p failed kidney transplant - dialysis Tue, Th, Sat  . Renal insufficiency   . CHF (congestive heart failure)   . Anemia   . Dialysis patient    Past Surgical History  Procedure Laterality Date  . Kidney receipient  2006    failed and started HD in March 2014  . Capd insertion    . Capd removal    . Left heart catheterization with coronary angiogram N/A 09/02/2014    Procedure: LEFT HEART CATHETERIZATION WITH CORONARY ANGIOGRAM;  Surgeon: [name], MD;  Location: [facility] CATH LAB;  Service:  Cardiovascular;  Laterality: N/A;   No family history on file. History  Substance Use Topics  . Smoking status: Former Smoker -- 1.00 packs/day for 1 years    Types: Cigarettes  . Smokeless tobacco: Never Used     Comment: quit Jan 2014  . Alcohol Use: No    Review of Systems  Respiratory: Positive for shortness of breath.   Neurological: Positive for weakness.  All other systems reviewed and are negative.     Allergies  Ferrlecit and Darvocet  Home Medications   Prior to Admission medications   Medication Sig Start Date End Date Taking? Authorizing Provider  atorvastatin (LIPITOR) 40 MG tablet Take 1 tablet (40 mg total) by mouth daily at 6 PM. 09/02/14  Yes Barton Dubois, MD  carvedilol (COREG) 25 MG tablet Take 1 tablet (25 mg total) by mouth 2 (two) times daily with a meal. 10/10/14  Yes [name], DO  cinacalcet (SENSIPAR) 30 MG tablet Take 30 mg by mouth daily.   Yes Historical Provider, MD  diclofenac sodium (VOLTAREN) 1 % GEL Apply 2 g topically 4 (four) times daily. 11/14/14  Yes Tresa Garter, MD  dicyclomine (BENTYL) 20 MG tablet Take 1 tablet (20 mg total) by mouth 2 (two) times  daily. 12/20/14  Yes Ernestina Patches, MD  docusate sodium (COLACE) 100 MG capsule Take 1 capsule (100 mg total) by mouth 2 (two) times daily. 11/19/14  Yes [name], MD  doxercalciferol (HECTOROL) 4 MCG/2ML injection Inject 1.25 mLs (2.5 mcg total) into the vein Every Tuesday,Thursday,and Saturday with dialysis. 10/10/14  Yes [name], DO  levalbuterol (XOPENEX HFA) 45 MCG/ACT inhaler Inhale 2 puffs into the lungs every 6 (six) hours as needed for wheezing or shortness of breath. 11/19/14  Yes [name], MD  lisinopril (PRINIVIL,ZESTRIL) 20 MG tablet Take 1 tablet (20 mg total) by mouth daily. 12/05/14  Yes Verlee Monte, MD  LORazepam (ATIVAN) 0.5 MG tablet Take 1 tablet (0.5 mg total) by mouth at bedtime as needed for anxiety. 11/19/14  Yes [name], MD  methocarbamol  (ROBAXIN) 500 MG tablet Take 1 tablet (500 mg total) by mouth every 8 (eight) hours as needed for muscle spasms. 12/29/14  Yes Leonard Schwartz, MD  omeprazole (PRILOSEC) 20 MG capsule Take 20 mg by mouth daily. 07/01/13  Yes Historical Provider, MD  ondansetron (ZOFRAN) 4 MG tablet Take 1 tablet (4 mg total) by mouth every 6 (six) hours. 12/20/14  Yes Ernestina Patches, MD  ondansetron (ZOFRAN-ODT) 4 MG disintegrating tablet Take 1 tablet (4 mg total) by mouth every 8 (eight) hours as needed for nausea or vomiting. 12/05/14  Yes Verlee Monte, MD  predniSONE (DELTASONE) 5 MG tablet Take 5 mg by mouth daily with breakfast.   Yes Historical Provider, MD  sodium polystyrene (KAYEXALATE) powder Take by mouth once. Take 30g by mouth one time. 12/31/14  Yes Serita Grit, MD  warfarin (COUMADIN) 4 MG tablet Take 1 tablet (4 mg total) by mouth daily at 6 PM. 12/05/14  Yes Verlee Monte, MD  ciprofloxacin (CIPRO) 500 MG tablet Take 1 tablet (500 mg total) by mouth daily. Patient not taking: Reported on 12/20/2014 12/16/14   Larene Pickett, PA-C  enoxaparin (LOVENOX) 80 MG/0.8ML injection Inject 0.8 mLs (80 mg total) into the skin daily. Patient not taking: Reported on 12/26/2014 12/16/14   Ezequiel Essex, MD   BP 163/110 mmHg  Pulse 83  Temp(Src) 98.5 F (36.9 C) (Oral)  Resp 15  Ht _0  (1.854 m)  Wt 177 lb (80.287 kg)  BMI 23.36 kg/m2  SpO2 97% Physical Exam  Constitutional: He is oriented to person, place, and time. He appears well-developed and well-nourished.  HENT:  Head: Normocephalic and atraumatic.  Cardiovascular: Normal rate and regular rhythm.   No murmur heard. Pulmonary/Chest: Effort normal. No respiratory distress.  Decreased air movement in bilateral bases.  Vas cath in right anterior chest wall with dirty dressing in place.    Abdominal: There is no rebound and no guarding.  Distended abdomen with catheter in right abdomen.    Musculoskeletal: He exhibits no tenderness.  Nonpitting edema of  BLE  Neurological: He is alert and oriented to person, place, and time.  Generalized weakness  Skin: Skin is warm and dry.  Psychiatric: He has a normal mood and affect. His behavior is normal.  Nursing note and vitals reviewed.   ED Course  Procedures (including critical care time) Labs Review Labs Reviewed  CBC - Abnormal; Notable for the following:    RBC 2.85 (*)    Hemoglobin 7.4 (*)    HCT 23.0 (*)    All other components within normal limits  COMPREHENSIVE METABOLIC PANEL - Abnormal; Notable for the following:    Potassium  5.6 (*)    CO2 19 (*)    BUN 94 (*)    Creatinine, Ser 25.82 (*)    Calcium 8.0 (*)    Albumin 2.2 (*)    AST 53 (*)    ALT 14 (*)    GFR calc non Af Amer 2 (*)    GFR calc Af Amer 2 (*)    All other components within normal limits  TYPE AND SCREEN  PREPARE RBC (CROSSMATCH)    Imaging Review Dg Chest Port 1 View  01/02/2015   CLINICAL DATA:  Shortness of breath, vomiting, diarrhea x2 days  EXAM: PORTABLE CHEST - 1 VIEW  COMPARISON:  12/31/2014  FINDINGS: Lungs are clear.  No pleural effusion or pneumothorax.  Cardiomegaly.  Right IJ dual lumen dialysis catheter terminates in the right atrium.  IMPRESSION: No evidence of acute cardiopulmonary disease.   Electronically Signed   By: Julian Hy M.D.   On: 01/02/2015 13:29     EKG Interpretation   Date/Time:  Monday January 02 2015 12:28:46 EDT Ventricular Rate:  86 PR Interval:  158 QRS Duration: 96 QT Interval:  433 QTC Calculation: 518 R Axis:   17 Text Interpretation:  Sinus rhythm Prolonged QT interval Baseline wander  in lead(s) V1 Confirmed by Hazle Coca [id number]) on 01/02/2015 12:45:50 PM      MDM   Final diagnoses:  Hyperkalemia  ESRD on hemodialysis  Symptomatic anemia   Patient with history of end-stage renal disease on hemodialysis, noncompliance with treatment here for progressive shortness of breath and progressive weakness. Patient has mild hyperkalemia. Patient is  anemic, no active bleeding. Plan to admit for dialysis with blood transfusion.  Quintella Reichert, MD 01/02/15 [phone]

## 2015-01-03 ENCOUNTER — Emergency Department (HOSPITAL_COMMUNITY)
Admission: EM | Admit: 2015-01-03 | Discharge: 2015-01-04 | Disposition: A | Discharge status: Home / Self Care | Payer: Medicare Other | Attending: Emergency Medicine | Admitting: Emergency Medicine

## 2015-01-03 ENCOUNTER — Emergency Department (HOSPITAL_COMMUNITY): Payer: Medicare Other

## 2015-01-03 ENCOUNTER — Encounter (HOSPITAL_COMMUNITY): Payer: Self-pay | Admitting: Emergency Medicine

## 2015-01-03 DIAGNOSIS — I120 Hypertensive chronic kidney disease with stage 5 chronic kidney disease or end stage renal disease: Secondary | ICD-10-CM | POA: Insufficient documentation

## 2015-01-03 DIAGNOSIS — Z862 Personal history of diseases of the blood and blood-forming organs and certain disorders involving the immune mechanism: Secondary | ICD-10-CM | POA: Insufficient documentation

## 2015-01-03 DIAGNOSIS — E8770 Fluid overload, unspecified: Secondary | ICD-10-CM

## 2015-01-03 DIAGNOSIS — F419 Anxiety disorder, unspecified: Secondary | ICD-10-CM | POA: Insufficient documentation

## 2015-01-03 DIAGNOSIS — I82401 Acute embolism and thrombosis of unspecified deep veins of right lower extremity: Secondary | ICD-10-CM

## 2015-01-03 DIAGNOSIS — R079 Chest pain, unspecified: Secondary | ICD-10-CM | POA: Insufficient documentation

## 2015-01-03 DIAGNOSIS — I509 Heart failure, unspecified: Secondary | ICD-10-CM | POA: Insufficient documentation

## 2015-01-03 DIAGNOSIS — E119 Type 2 diabetes mellitus without complications: Secondary | ICD-10-CM | POA: Insufficient documentation

## 2015-01-03 DIAGNOSIS — N186 End stage renal disease: Secondary | ICD-10-CM | POA: Insufficient documentation

## 2015-01-03 DIAGNOSIS — Z79899 Other long term (current) drug therapy: Secondary | ICD-10-CM | POA: Insufficient documentation

## 2015-01-03 DIAGNOSIS — Z792 Long term (current) use of antibiotics: Secondary | ICD-10-CM | POA: Insufficient documentation

## 2015-01-03 DIAGNOSIS — Z87891 Personal history of nicotine dependence: Secondary | ICD-10-CM | POA: Insufficient documentation

## 2015-01-03 DIAGNOSIS — I10 Essential (primary) hypertension: Secondary | ICD-10-CM

## 2015-01-03 DIAGNOSIS — D638 Anemia in other chronic diseases classified elsewhere: Secondary | ICD-10-CM

## 2015-01-03 DIAGNOSIS — Z992 Dependence on renal dialysis: Secondary | ICD-10-CM | POA: Insufficient documentation

## 2015-01-03 DIAGNOSIS — Z7952 Long term (current) use of systemic steroids: Secondary | ICD-10-CM | POA: Insufficient documentation

## 2015-01-03 LAB — RENAL FUNCTION PANEL
Albumin: 2.1 g/dL — ABNORMAL LOW (ref 3.5–5.0)
Anion gap: 16 — ABNORMAL HIGH (ref 5–15)
BUN: 97 mg/dL — ABNORMAL HIGH (ref 6–20)
CHLORIDE: 102 mmol/L (ref 101–111)
CO2: 18 mmol/L — AB (ref 22–32)
Calcium: 8 mg/dL — ABNORMAL LOW (ref 8.9–10.3)
Creatinine, Ser: 26.57 mg/dL — ABNORMAL HIGH (ref 0.61–1.24)
GFR, EST AFRICAN AMERICAN: 2 mL/min — AB (ref >60)
GFR, EST NON AFRICAN AMERICAN: 2 mL/min — AB (ref >60)
Glucose, Bld: 90 mg/dL (ref 65–99)
Phosphorus: 6.6 mg/dL — ABNORMAL HIGH (ref 2.5–4.6)
Potassium: 5.8 mmol/L — ABNORMAL HIGH (ref 3.5–5.1)
SODIUM: 136 mmol/L (ref 135–145)

## 2015-01-03 LAB — PROTIME-INR
INR: 1.96 — AB (ref 0.00–1.49)
Prothrombin Time: 22.2 seconds — ABNORMAL HIGH (ref 11.6–15.2)

## 2015-01-03 LAB — CBC
HCT: 22.9 % — ABNORMAL LOW (ref 39.0–52.0)
HEMOGLOBIN: 7.5 g/dL — AB (ref 13.0–17.0)
MCH: 26 pg (ref 26.0–34.0)
MCHC: 32.8 g/dL (ref 30.0–36.0)
MCV: 79.2 fL (ref 78.0–100.0)
Platelets: 287 10*3/uL (ref 150–400)
RBC: 2.89 MIL/uL — AB (ref 4.22–5.81)
RDW: 14.5 % (ref 11.5–15.5)
WBC: 6.1 10*3/uL (ref 4.0–10.5)

## 2015-01-03 LAB — PREPARE RBC (CROSSMATCH)

## 2015-01-03 MED ORDER — DARBEPOETIN ALFA 200 MCG/0.4ML IJ SOSY
PREFILLED_SYRINGE | INTRAMUSCULAR | Status: AC
  Administered 2015-01-03: 200 ug via INTRAVENOUS
  Filled 2015-01-03: qty 0.4

## 2015-01-03 MED ORDER — HYDRALAZINE HCL 25 MG PO TABS: 25.0000 mg | ORAL_TABLET | Freq: Three times a day (TID) | ORAL | Status: DC

## 2015-01-03 MED ORDER — CALCIUM CARBONATE ANTACID 500 MG PO CHEW
2.0000 | CHEWABLE_TABLET | Freq: Four times a day (QID) | ORAL | Status: DC | PRN
  Administered 2015-01-03: 400 mg via ORAL
  Filled 2015-01-03: qty 2

## 2015-01-03 MED ORDER — ACETAMINOPHEN 500 MG PO TABS
1000.0000 mg | ORAL_TABLET | Freq: Once | ORAL | Status: AC
  Administered 2015-01-03: 1000 mg via ORAL
  Filled 2015-01-03: qty 2

## 2015-01-03 MED ORDER — FAMOTIDINE 20 MG PO TABS
20.0000 mg | ORAL_TABLET | Freq: Once | ORAL | Status: AC
  Administered 2015-01-03: 20 mg via ORAL
  Filled 2015-01-03: qty 1

## 2015-01-03 MED ORDER — SODIUM CHLORIDE 0.9 % IV SOLN
Freq: Once | INTRAVENOUS | Status: DC
Start: 2015-01-03 — End: 2015-01-03

## 2015-01-03 MED ORDER — METHOCARBAMOL 500 MG PO TABS
500.0000 mg | ORAL_TABLET | Freq: Once | ORAL | Status: AC
  Administered 2015-01-03: 500 mg via ORAL
  Filled 2015-01-03: qty 1

## 2015-01-03 MED ORDER — DOXERCALCIFEROL 4 MCG/2ML IV SOLN
INTRAVENOUS | Status: AC
  Administered 2015-01-03: 2.5 ug via INTRAVENOUS
  Filled 2015-01-03: qty 2

## 2015-01-03 MED ORDER — SODIUM CHLORIDE 0.9 % IV SOLN: Freq: Once | INTRAVENOUS | Status: DC

## 2015-01-03 MED ORDER — WARFARIN SODIUM 5 MG PO TABS
5.0000 mg | ORAL_TABLET | Freq: Once | ORAL | Status: DC
  Filled 2015-01-03: qty 1

## 2015-01-03 NOTE — Progress Notes (Signed)
ANTICOAGULATION CONSULT NOTE - Initial Consult  Pharmacy Consult for Warfarin Indication: h/o   pulmonary embolus / DVT   Allergies  Allergen Reactions  . Ferrlecit [Na Ferric Gluc Cplx In Sucrose] Shortness Of Breath, Swelling and Other (See Comments)    Swelling in throat  . Darvocet [Propoxyphene N-Acetaminophen] Hives    Patient Measurements: Height: _0  (185.4 cm) Weight: 178 lb 5.6 oz (80.9 kg) IBW/kg (Calculated) : 79.9 Heparin Dosing Weight:   Vital Signs: Temp: 96.8 F (36 C) (06/14 1130) Temp Source: Oral (06/14 1130) BP: 171/117 mmHg (06/14 1304) Pulse Rate: 88 (06/14 1304)  Labs:  Recent Labs  01/02/15 1406 01/03/15 0847 01/03/15 0848 01/03/15 0922  HGB 7.4* 7.5*  --   --   HCT 23.0* 22.9*  --   --   PLT 268 287  --   --   LABPROT  --   --   --  22.2*  INR  --   --   --  1.96*  CREATININE 25.82*  --  26.57*  --     Estimated Creatinine Clearance: 3.8 mL/min (by C-G formula based on Cr of 26.57).   Medical History: Past Medical History  Diagnosis Date  . Hypertension   . Depression   . Complication of anesthesia     itching, sore throat  . Diabetes mellitus without complication     No history per patient, but remains under history as A1c would not be accurate given on dialysis  . Shortness of breath   . Anxiety   . ESRD (end stage renal disease)     due to HTN per patient, followed at [facility], s/p failed kidney transplant - dialysis Tue, Th, Sat  . Renal insufficiency   . CHF (congestive heart failure)   . Anemia   . Dialysis patient     Medications:  Prescriptions prior to admission  Medication Sig Dispense Refill Last Dose  . atorvastatin (LIPITOR) 40 MG tablet Take 1 tablet (40 mg total) by mouth daily at 6 PM. 30 tablet 1 01/01/2015 at Unknown time  . carvedilol (COREG) 25 MG tablet Take 1 tablet (25 mg total) by mouth 2 (two) times daily with a meal.   01/01/2015 at 18:00  . cinacalcet (SENSIPAR) 30 MG tablet Take 30 mg by mouth  daily.   01/01/2015 at Unknown time  . diclofenac sodium (VOLTAREN) 1 % GEL Apply 2 g topically 4 (four) times daily. 2 Tube 2 01/01/2015 at Unknown time  . dicyclomine (BENTYL) 20 MG tablet Take 1 tablet (20 mg total) by mouth 2 (two) times daily. 10 tablet 0 01/01/2015 at Unknown time  . docusate sodium (COLACE) 100 MG capsule Take 1 capsule (100 mg total) by mouth 2 (two) times daily. 60 capsule 0 01/01/2015 at Unknown time  . doxercalciferol (HECTOROL) 4 MCG/2ML injection Inject 1.25 mLs (2.5 mcg total) into the vein Every Tuesday,Thursday,and Saturday with dialysis. 2 mL  Past Month at Unknown time  . levalbuterol (XOPENEX HFA) 45 MCG/ACT inhaler Inhale 2 puffs into the lungs every 6 (six) hours as needed for wheezing or shortness of breath. 1 Inhaler 2 Past Week at Unknown time  . lisinopril (PRINIVIL,ZESTRIL) 20 MG tablet Take 1 tablet (20 mg total) by mouth daily. 30 tablet 0 01/01/2015 at Unknown time  . LORazepam (ATIVAN) 0.5 MG tablet Take 1 tablet (0.5 mg total) by mouth at bedtime as needed for anxiety. 15 tablet 0 Past Week at Unknown time  . methocarbamol (ROBAXIN) 500 MG  tablet Take 1 tablet (500 mg total) by mouth every 8 (eight) hours as needed for muscle spasms. 30 tablet 0 Past Week at Unknown time  . omeprazole (PRILOSEC) 20 MG capsule Take 20 mg by mouth daily.   01/01/2015 at Unknown time  . ondansetron (ZOFRAN) 4 MG tablet Take 1 tablet (4 mg total) by mouth every 6 (six) hours. 12 tablet 0 01/01/2015 at Unknown time  . ondansetron (ZOFRAN-ODT) 4 MG disintegrating tablet Take 1 tablet (4 mg total) by mouth every 8 (eight) hours as needed for nausea or vomiting. 20 tablet 0 Past Month at Unknown time  . predniSONE (DELTASONE) 5 MG tablet Take 5 mg by mouth daily with breakfast.   01/01/2015 at Unknown time  . sodium polystyrene (KAYEXALATE) powder Take by mouth once. Take 30g by mouth one time. 30 g 0 01/01/2015 at Unknown time  . warfarin (COUMADIN) 4 MG tablet Take 1 tablet (4 mg total)  by mouth daily at 6 PM. 30 tablet 0 01/01/2015 at Unknown time  . enoxaparin (LOVENOX) 80 MG/0.8ML injection Inject 0.8 mLs (80 mg total) into the skin daily. (Patient not taking: Reported on 12/26/2014) 5 Syringe 0 Not Taking at Unknown time   Scheduled:  . sodium chloride   Intravenous Once  . sodium chloride   Intravenous Once  . sodium chloride   Intravenous Once  . atorvastatin  40 mg Oral q1800  . carvedilol  25 mg Oral BID WC  . cinacalcet  30 mg Oral Q breakfast  . darbepoetin (ARANESP) injection - DIALYSIS  200 mcg Intravenous Q Tue-HD  . dicyclomine  20 mg Oral BID  . docusate sodium  100 mg Oral BID  . doxercalciferol  2.5 mcg Intravenous Q T,Th,Sa-HD  . hydrALAZINE  25 mg Oral 3 times per day  . pantoprazole  40 mg Oral Daily  . predniSONE  5 mg Oral Q breakfast  . sodium chloride  3 mL Intravenous Q12H  . Warfarin - Pharmacist Dosing Inpatient   Does not apply q1800   Infusions:  . sodium chloride      Assessment: 49yo male with history of DVT/PE, CHF, ESRD on HD TTS, DM2, and HTN presents with weakness and SOB. Pharmacy is consulted to dose warfarin for history of VTE. CBC stable with Hgb 7.5, Plt 267. INR 1.96 this AM (INR drawn in HD). Received 2 units prbcs on HD today.   PTA Warfarin Dose: 99m/d with last dose taken 01/01/15.  Goal of Therapy:  INR 2-3 Monitor platelets by anticoagulation protocol: Yes   Plan:  Warfarin 572mPO tonight x1 If patient is discharged today, recommend to maintain previous home dose 4 mg daily with INR check end of this week. Daily INR/CBC Monitor s/sx of bleeding   RuNicole CellaRPh Clinical Pharmacist Pager: [phone]/14/2016,1:12 PM

## 2015-01-03 NOTE — ED Provider Notes (Signed)
CSN: [id number]     Arrival date & time 01/03/15  2222 History  This chart was scribed for Everlene Balls, MD by Delphia Grates, ED Scribe. This patient was seen in room D36C/D36C and the patient's care was started at 11:04 PM.   Chief Complaint  Patient presents with  . Chest Pain    The history is provided by the patient. No language interpreter was used.     HPI Comments: Frank [name] is a 49 y.o. male, with history of HTN, CHF, and DM, brought in by ambulance, who presents to the Emergency Department complaining of central chest pain that began this evening. Patient was admitted today, received dialysis and went home when his pain started. There is associated mild SOB and patient states this episode "feels like a blood clot". Patient is on Coumadin and states he is compliant with his medications. Patient states the pain is exacerbated with movement and when lying supine. He has not taken anything for pain relief. He denies fever, nausea, vomiting, or diahphoresis.   Patient has 27 ED visits in the last 6 months.   Past Medical History  Diagnosis Date  . Hypertension   . Depression   . Complication of anesthesia     itching, sore throat  . Diabetes mellitus without complication     No history per patient, but remains under history as A1c would not be accurate given on dialysis  . Shortness of breath   . Anxiety   . ESRD (end stage renal disease)     due to HTN per patient, followed at [facility], s/p failed kidney transplant - dialysis Tue, Th, Sat  . Renal insufficiency   . CHF (congestive heart failure)   . Anemia   . Dialysis patient    Past Surgical History  Procedure Laterality Date  . Kidney receipient  2006    failed and started HD in March 2014  . Capd insertion    . Capd removal    . Left heart catheterization with coronary angiogram N/A 09/02/2014    Procedure: LEFT HEART CATHETERIZATION WITH CORONARY ANGIOGRAM;  Surgeon: [name], MD;  Location: [facility] C[facility] LAB;   Service: Cardiovascular;  Laterality: N/A;   No family history on file. History  Substance Use Topics  . Smoking status: Former Smoker -- 1.00 packs/day for 1 years    Types: Cigarettes  . Smokeless tobacco: Never Used     Comment: quit Jan 2014  . Alcohol Use: No    Review of Systems  A complete 10 system review of systems was obtained and all systems are negative except as noted in the HPI and PMH.   Allergies  Ferrlecit and Darvocet  Home Medications   Prior to Admission medications   Medication Sig Start Date End Date Taking? Authorizing Provider  atorvastatin (LIPITOR) 40 MG tablet Take 1 tablet (40 mg total) by mouth daily at 6 PM. 09/02/14   Barton Dubois, MD  carvedilol (COREG) 25 MG tablet Take 1 tablet (25 mg total) by mouth 2 (two) times daily with a meal. 10/10/14   [name], DO  cinacalcet (SENSIPAR) 30 MG tablet Take 30 mg by mouth daily.    Historical Provider, MD  diclofenac sodium (VOLTAREN) 1 % GEL Apply 2 g topically 4 (four) times daily. 11/14/14   Tresa Garter, MD  dicyclomine (BENTYL) 20 MG tablet Take 1 tablet (20 mg total) by mouth 2 (two) times daily. 12/20/14   Ernestina Patches, MD  docusate sodium (COLACE) 100 MG capsule Take 1 capsule (100 mg total) by mouth 2 (two) times daily. 11/19/14   [name], MD  doxercalciferol (HECTOROL) 4 MCG/2ML injection Inject 1.25 mLs (2.5 mcg total) into the vein Every Tuesday,Thursday,and Saturday with dialysis. 10/10/14   [name], DO  enoxaparin (LOVENOX) 80 MG/0.8ML injection Inject 0.8 mLs (80 mg total) into the skin daily. Patient not taking: Reported on 12/26/2014 12/16/14   Ezequiel Essex, MD  hydrALAZINE (APRESOLINE) 25 MG tablet Take 1 tablet (25 mg total) by mouth every 8 (eight) hours. 01/03/15   [name], MD  levalbuterol [facility] HFA) 45 MCG/ACT inhaler Inhale 2 puffs into the lungs every 6 (six) hours as needed for wheezing or shortness of breath. 11/19/14   [name], MD  lisinopril  (PRINIVIL,ZESTRIL) 20 MG tablet Take 1 tablet (20 mg total) by mouth daily. 12/05/14   Verlee Monte, MD  LORazepam (ATIVAN) 0.5 MG tablet Take 1 tablet (0.5 mg total) by mouth at bedtime as needed for anxiety. 11/19/14   [name], MD  methocarbamol (ROBAXIN) 500 MG tablet Take 1 tablet (500 mg total) by mouth every 8 (eight) hours as needed for muscle spasms. 12/29/14   Leonard Schwartz, MD  omeprazole (PRILOSEC) 20 MG capsule Take 20 mg by mouth daily. 07/01/13   Historical Provider, MD  ondansetron (ZOFRAN) 4 MG tablet Take 1 tablet (4 mg total) by mouth every 6 (six) hours. 12/20/14   Ernestina Patches, MD  ondansetron (ZOFRAN-ODT) 4 MG disintegrating tablet Take 1 tablet (4 mg total) by mouth every 8 (eight) hours as needed for nausea or vomiting. 12/05/14   Verlee Monte, MD  predniSONE (DELTASONE) 5 MG tablet Take 5 mg by mouth daily with breakfast.    Historical Provider, MD  sodium polystyrene (KAYEXALATE) powder Take by mouth once. Take 30g by mouth one time. 12/31/14   Serita Grit, MD  warfarin (COUMADIN) 4 MG tablet Take 1 tablet (4 mg total) by mouth daily at 6 PM. 12/05/14   Verlee Monte, MD   Triage Vitals: BP 141/103 mmHg  Pulse 90  Temp(Src) 98.3 F (36.8 C) (Oral)  Resp 18  SpO2 100%  Physical Exam  Constitutional: He is oriented to person, place, and time. Vital signs are normal. He appears well-developed and well-nourished.  Non-toxic appearance. He does not appear ill. No distress.  HENT:  Head: Normocephalic and atraumatic.  Nose: Nose normal.  Mouth/Throat: Oropharynx is clear and moist. No oropharyngeal exudate.  Eyes: Conjunctivae and EOM are normal. Pupils are equal, round, and reactive to light. No scleral icterus.  Neck: Normal range of motion. Neck supple. No tracheal deviation, no edema, no erythema and normal range of motion present. No thyroid mass and no thyromegaly present.  HD catheter in right neck. Clean dry. No signs of infection.  Cardiovascular: Normal rate,  regular rhythm, S1 normal, S2 normal, normal heart sounds, intact distal pulses and normal pulses.  Exam reveals no gallop and no friction rub.   No murmur heard. Pulses:      Radial pulses are 2+ on the right side, and 2+ on the left side.       Dorsalis pedis pulses are 2+ on the right side, and 2+ on the left side.  Pulmonary/Chest: Effort normal and breath sounds normal. No respiratory distress. He has no wheezes. He has no rhonchi. He has no rales.  Abdominal: Soft. Normal appearance and bowel sounds are normal. He exhibits no distension, no  ascites and no mass. There is no hepatosplenomegaly. There is no tenderness. There is no rebound, no guarding and no CVA tenderness.  Peritoneal dialysis site in RLQ of abdomen. Clean and dry and no signs of infection.  Musculoskeletal: Normal range of motion. He exhibits no edema or tenderness.  Lymphadenopathy:    He has no cervical adenopathy.  Neurological: He is alert and oriented to person, place, and time. He has normal strength. No cranial nerve deficit or sensory deficit.  Skin: Skin is warm, dry and intact. No petechiae and no rash noted. He is not diaphoretic. No erythema. No pallor.  Dry skin in BLE.  Psychiatric: He has a normal mood and affect. His behavior is normal. Judgment normal.  Nursing note and vitals reviewed.   ED Course  Procedures (including critical care time)  DIAGNOSTIC STUDIES: Oxygen Saturation is 100% on room air, normal by my interpretation.    COORDINATION OF CARE: At 2307 Discussed treatment plan with patient which includes CXR. Patient agrees.   Labs Review Labs Reviewed - No data to display  Imaging Review Dg Chest 2 View  01/04/2015   CLINICAL DATA:  Acute onset of generalized chest pain and sore throat for 2 days. Initial encounter.  EXAM: CHEST  2 VIEW  COMPARISON:  Chest radiograph performed 01/02/2015  FINDINGS: The lungs are relatively well expanded. Mild vascular congestion is noted. Mild bibasilar  atelectasis is seen. No pleural effusion or pneumothorax is identified.  The cardiomediastinal silhouette is mildly enlarged. No acute osseous abnormalities are identified. A right-sided dual-lumen catheter is noted ending overlying the right atrium.  IMPRESSION: Mild vascular congestion and mild cardiomegaly noted. Mild bibasilar atelectasis seen.   Electronically Signed   By: Garald Balding M.D.   On: 01/04/2015 00:21   Dg Chest Port 1 View  01/02/2015   CLINICAL DATA:  Shortness of breath, vomiting, diarrhea x2 days  EXAM: PORTABLE CHEST - 1 VIEW  COMPARISON:  12/31/2014  FINDINGS: Lungs are clear.  No pleural effusion or pneumothorax.  Cardiomegaly.  Right IJ dual lumen dialysis catheter terminates in the right atrium.  IMPRESSION: No evidence of acute cardiopulmonary disease.   Electronically Signed   By: Julian Hy M.D.   On: 01/02/2015 13:29     EKG Interpretation   Date/Time:  Tuesday January 03 2015 22:41:40 EDT Ventricular Rate:  89 PR Interval:  159 QRS Duration: 84 QT Interval:  409 QTC Calculation: 498 R Axis:   -39 Text Interpretation:  Sinus rhythm Probable left atrial enlargement Left  ventricular hypertrophy Nonspecific T abnormalities, lateral leads  Borderline prolonged QT interval No significant change since last tracing  Confirmed by BEATON  MD, ROBERT ([id number]) on 01/03/2015 10:48:02 PM      MDM   Final diagnoses:  None   Patient presents to the ED for chest pain.  He was just DC from the hospital today.  His history is not consistent with ACS and his EKG is normal.  Patient visits this ED several times, often seeking narcotic pain medication.  I have concern for drug seeking behavior in this patient and he will not be given narcotics.  Willl obtain chest xray for evaluation of his pain.  He has h/o PE but is therapeutic with his coumadin as seen on lab work today.    Chest x-ray is unremarkable. Patient was given Robaxin and Tylenol for his pain. He appears  comfortable in the room and in no acute distress. His vital signs remain within his  normal limits and he is safe for discharge with primary follow up within 3 days.  I personally performed the services described in this documentation, which was scribed in my presence. The recorded information has been reviewed and is accurate.   Everlene Balls, MD 01/04/15 [phone]

## 2015-01-03 NOTE — Progress Notes (Signed)
Patient still c/o heartburn,2 tums given at 0305.Text paged MD on call.Will continue to monitor. Sherle Mello, [name], Therapist, sports

## 2015-01-03 NOTE — Procedures (Addendum)
I was present at this dialysis session. I have reviewed the session itself and made appropriate changes.   Pt tolerating treatment well. Qb 300 reduced since large intradialytic gap.  Goal UF 4L. I have contacted patient's Outpatient Dialysis Ctr[facility] in [place]. Patient has been transferred back to in center dialysis from the home therapies division at that unit. He has not attended in-center dialysis since the transfer. It is unclear if he has done any peritoneal dialysis whatsoever.  I have spoken with his outpatient nephrologist, Dr. Kern Alberta with [facility].  She has tried very hard to accommodate him. At a period of time he was on nocturnal an center hemodialysis. He currently has been transitioned to in- center conventional hemodialysis with a potential plan for transitioning back to peritoneal dialysis. She has confirmed that his dialysis chair is at 11 AM on Tuesday, Thursday, Saturday. They will be expecting him on Thursday.  The patient wishes to transfer to [facility]; he is not accepted to any units in the area and from what I have gathered his long history of noncompliance and the current circumstances he will not be accepted.   At this time there are no further inpatient dialysis needs. The patient needs to attend his dialysis center to receive chronic outpatient hemodialysis. If he has concerns about the location or the care he receives there, they need to be addressed at that center. If he wishes to transition to a different dialysis unit and needs to be addressed from his outpatient unit.  Pearson Grippe  MD 01/03/2015, 10:21 AM   Pt updated regarding his treatment time and that I have been i ntouch with Dr. Kern Alberta.  He knows that requests to transfer should be addressed through his outpt unit.

## 2015-01-03 NOTE — Progress Notes (Signed)
RN went to give pepcid but patient was asleep. Will continue to monitor. Deandra Goering, [name], Therapist, sports

## 2015-01-03 NOTE — Discharge Summary (Signed)
Physician Discharge Summary  Frank [name] DOB: 01/16/1966 DOA: 01/02/2015  PCP: Angelica Chessman, MD  Admit date: 01/02/2015 Discharge date: 01/03/2015  Time spent: *25 minutes  Recommendations for Outpatient Follow-up:  1. Follow up PCP in 2 weeks  Discharge Diagnoses:  Principal Problem:   Volume overload Active Problems:   ESRD on dialysis- Tues, Thurs, Sat in [employer], nephro: [name]   Chronic combined systolic and diastolic CHF (congestive heart failure)   ESRD on hemodialysis   Hyperkalemia   Accelerated hypertension   History of noncompliance with medical treatment   Chronic pulmonary embolism   DVT (deep venous thrombosis)   Anemia of chronic disease   Dyslipidemia   HTN (hypertension)   Discharge Condition: Stable  Diet recommendation: Low salt diet  Filed Weights   01/02/15 1058 01/03/15 0500 01/03/15 0830  Weight: 80.287 kg (177 lb) 79.5 kg (175 lb 4.3 oz) 80.9 kg (178 lb 5.6 oz)    History of present illness:  49 year old male with past medical history of ESRD on HD, very much non compliant with HD and medical therapy, visits ED PRN for HD, chronic combined systolic and diastolic CHF( last 2 D ECHO in 11/2014 with EF of 35% and grade 2 diastolic dysfunction), hypertension, PE and DVT on anticoagulation with coumadin. He presented to [facility] ED with reports of feeling weak and short of breath over past few days prior to this admission and thinks he needs dialysis. Last HD was about 10 days ago. Pt has peritoneal catheter which was placed in anticipation that he will eventually do PD at home but this has not yet happened. No chest pain, no palpitations. No abdominal pain, nausea or vomiting. No fevers or chills. No falls. No loss of consciousness.  Hospital Course:   Volume overload / ESRD on HD / Noncompliance with medical therapy / Chronic combined systolic and diastolic CHF (congestive heart failure) - Pt volume overloaded on physical exam. Was  seen by nephrology - Per renal will do iHD on 01/03/15 and to have 2 units PRBC transfused with HD - Last 2 D ECHO in 11/2014 with EF of 35% and grade 2 diastolic dysfunction  - Underwent HD today, feels better   Active Problems: Hyperkalemia - Likely due to need for HD, missed HD and has not started PD - Patient had HD today, and nephrology recommends to discharge the patient today, he has an appointment with HD center at [facility]   Accelerated hypertension - Continue carvedilol and added hydralazine 25 mg PO Q 8 hours   Chronic pulmonary embolism / DVT (deep venous thrombosis) - Continue coumadin - INR was 1.96  Anemia of chronic disease - Secondary to ESRD - Got  2 units PRBC transfusion  with HD  Dyslipidemia - Resume statin therapy  Procedures:  None  Consultations:  Nephrology  Discharge Exam: Filed Vitals:   01/03/15 1230  BP: 151/116  Pulse: 96  Temp:   Resp: 16    General: Appear in no acute distress Cardiovascular: S1s2 RRR Respiratory: Clear bilaterally  Discharge Instructions   Discharge Instructions    Diet - low sodium heart healthy    Complete by:  As directed      Increase activity slowly    Complete by:  As directed           Current Discharge Medication List    START taking these medications   Details  hydrALAZINE (APRESOLINE) 25 MG tablet Take 1 tablet (25  mg total) by mouth every 8 (eight) hours. Qty: 90 tablet, Refills: 2      CONTINUE these medications which have NOT CHANGED   Details  atorvastatin (LIPITOR) 40 MG tablet Take 1 tablet (40 mg total) by mouth daily at 6 PM. Qty: 30 tablet, Refills: 1    carvedilol (COREG) 25 MG tablet Take 1 tablet (25 mg total) by mouth 2 (two) times daily with a meal.    cinacalcet (SENSIPAR) 30 MG tablet Take 30 mg by mouth daily.    diclofenac sodium (VOLTAREN) 1 % GEL Apply 2 g topically 4 (four) times daily. Qty: 2 Tube, Refills: 2    dicyclomine (BENTYL) 20 MG tablet Take 1  tablet (20 mg total) by mouth 2 (two) times daily. Qty: 10 tablet, Refills: 0    docusate sodium (COLACE) 100 MG capsule Take 1 capsule (100 mg total) by mouth 2 (two) times daily. Qty: 60 capsule, Refills: 0    doxercalciferol (HECTOROL) 4 MCG/2ML injection Inject 1.25 mLs (2.5 mcg total) into the vein Every Tuesday,Thursday,and Saturday with dialysis. Qty: 2 mL    levalbuterol (XOPENEX HFA) 45 MCG/ACT inhaler Inhale 2 puffs into the lungs every 6 (six) hours as needed for wheezing or shortness of breath. Qty: 1 Inhaler, Refills: 2    lisinopril (PRINIVIL,ZESTRIL) 20 MG tablet Take 1 tablet (20 mg total) by mouth daily. Qty: 30 tablet, Refills: 0    LORazepam (ATIVAN) 0.5 MG tablet Take 1 tablet (0.5 mg total) by mouth at bedtime as needed for anxiety. Qty: 15 tablet, Refills: 0   Associated Diagnoses: Sleep disturbances    methocarbamol (ROBAXIN) 500 MG tablet Take 1 tablet (500 mg total) by mouth every 8 (eight) hours as needed for muscle spasms. Qty: 30 tablet, Refills: 0    omeprazole (PRILOSEC) 20 MG capsule Take 20 mg by mouth daily.    ondansetron (ZOFRAN) 4 MG tablet Take 1 tablet (4 mg total) by mouth every 6 (six) hours. Qty: 12 tablet, Refills: 0    ondansetron (ZOFRAN-ODT) 4 MG disintegrating tablet Take 1 tablet (4 mg total) by mouth every 8 (eight) hours as needed for nausea or vomiting. Qty: 20 tablet, Refills: 0    predniSONE (DELTASONE) 5 MG tablet Take 5 mg by mouth daily with breakfast.    sodium polystyrene (KAYEXALATE) powder Take by mouth once. Take 30g by mouth one time. Qty: 30 g, Refills: 0    warfarin (COUMADIN) 4 MG tablet Take 1 tablet (4 mg total) by mouth daily at 6 PM. Qty: 30 tablet, Refills: 0    enoxaparin (LOVENOX) 80 MG/0.8ML injection Inject 0.8 mLs (80 mg total) into the skin daily. Qty: 5 Syringe, Refills: 0       Allergies  Allergen Reactions  . Ferrlecit [Na Ferric Gluc Cplx In Sucrose] Shortness Of Breath, Swelling and Other (See  Comments)    Swelling in throat  . Darvocet [Propoxyphene N-Acetaminophen] Hives      The results of significant diagnostics from this hospitalization (including imaging, microbiology, ancillary and laboratory) are listed below for reference.    Significant Diagnostic Studies: Dg Chest 2 View  12/31/2014   CLINICAL DATA:  Shortness of breath.  Edema.  On dialysis.  EXAM: CHEST  2 VIEW  COMPARISON:  12/12/2014 and 11/28/2014  FINDINGS: Right jugular dialysis catheter is unchanged. Cardiac silhouette remains enlarged. There is at most mild central pulmonary vascular congestion without evidence of overt pulmonary edema, airspace consolidation, sizeable pleural effusion, or pneumothorax. No acute osseous abnormality  is identified.  IMPRESSION: No evidence of acute airspace disease.   Electronically Signed   By: Logan Bores   On: 12/31/2014 07:59   US Scrotum  12/16/2014   CLINICAL DATA:  1-2 days of bilateral testicular pain  EXAM: SCROTAL ULTRASOUND  DOPPLER ULTRASOUND OF THE TESTICLES  TECHNIQUE: Complete ultrasound examination of the testicles, epididymis, and other scrotal structures was performed. Color and spectral Doppler ultrasound were also utilized to evaluate blood flow to the testicles.  COMPARISON:  11/05/2014  FINDINGS: Right testicle  Measurements: 4.2 x 2.2 x 3.3 cm. No mass or microlithiasis visualized.  Left testicle  Measurements: 4.1 x 2.2 x 3.4 cm. No mass or microlithiasis visualized.  Right epididymis:  Enlarged with increased blood flow  Left epididymis:  Enlarged with increased blood flow  Hydrocele:  None visualized.  Varicocele:  Bilateral varicoceles.  Pulsed Doppler interrogation of both testes demonstrates normal low resistance arterial and venous waveforms bilaterally.  Other: There is an well-circumscribed, anechoic structure within the right inguinal canal measuring 3.7 x 2.0 x 4.5 cm.  IMPRESSION: 1. No evidence for testicular mass or torsion. 2. Bilateral epididymitis.  3. Bilateral varicoceles 4. Right inguinal canal cyst.   Electronically Signed   By: Kerby Moors M.D.   On: 12/16/2014 12:50   [place] Art/ven Flow Abd Pelv Doppler  12/16/2014   CLINICAL DATA:  1-2 days of bilateral testicular pain  EXAM: SCROTAL ULTRASOUND  DOPPLER ULTRASOUND OF THE TESTICLES  TECHNIQUE: Complete ultrasound examination of the testicles, epididymis, and other scrotal structures was performed. Color and spectral Doppler ultrasound were also utilized to evaluate blood flow to the testicles.  COMPARISON:  11/05/2014  FINDINGS: Right testicle  Measurements: 4.2 x 2.2 x 3.3 cm. No mass or microlithiasis visualized.  Left testicle  Measurements: 4.1 x 2.2 x 3.4 cm. No mass or microlithiasis visualized.  Right epididymis:  Enlarged with increased blood flow  Left epididymis:  Enlarged with increased blood flow  Hydrocele:  None visualized.  Varicocele:  Bilateral varicoceles.  Pulsed Doppler interrogation of both testes demonstrates normal low resistance arterial and venous waveforms bilaterally.  Other: There is an well-circumscribed, anechoic structure within the right inguinal canal measuring 3.7 x 2.0 x 4.5 cm.  IMPRESSION: 1. No evidence for testicular mass or torsion. 2. Bilateral epididymitis. 3. Bilateral varicoceles 4. Right inguinal canal cyst.   Electronically Signed   By: Kerby Moors M.D.   On: 12/16/2014 12:50   Dg Chest Port 1 View  01/02/2015   CLINICAL DATA:  Shortness of breath, vomiting, diarrhea x2 days  EXAM: PORTABLE CHEST - 1 VIEW  COMPARISON:  12/31/2014  FINDINGS: Lungs are clear.  No pleural effusion or pneumothorax.  Cardiomegaly.  Right IJ dual lumen dialysis catheter terminates in the right atrium.  IMPRESSION: No evidence of acute cardiopulmonary disease.   Electronically Signed   By: Julian Hy M.D.   On: 01/02/2015 13:29   Dg Chest Port 1 View  12/12/2014   CLINICAL DATA:  Shortness of breath, dialysis  EXAM: PORTABLE CHEST - 1 VIEW  COMPARISON:  11/28/2014   FINDINGS: Cardiomegaly again noted. Dual lumen right IJ dialysis catheter with tip in right atrium is unchanged in position. No acute infiltrate or pleural effusion. No pulmonary edema.  IMPRESSION: No active disease.   Electronically Signed   By: Lahoma Crocker M.D.   On: 12/12/2014 13:23    Microbiology: Recent Results (from the past 240 hour(s))  MRSA PCR Screening  Status: None   Collection Time: 01/02/15  4:22 PM  Result Value Ref Range Status   MRSA by PCR NEGATIVE NEGATIVE Final    Comment:        The GeneXpert MRSA Assay (FDA approved for NASAL specimens only), is one component of a comprehensive MRSA colonization surveillance program. It is not intended to diagnose MRSA infection nor to guide or monitor treatment for MRSA infections.      Labs: Basic Metabolic Panel:  Recent Labs Lab 12/28/14 1958 12/29/14 2138 12/31/14 0750 01/02/15 1406 01/03/15 0848  NA 135 134* 137 139 136  K 5.3* 5.6* 5.4* 5.6* 5.8*  CL 100* 99* 100* 105 102  CO2 20* 20* 19* 19* 18*  GLUCOSE 99 104* 111* 96 90  BUN 60* 66* 80* 94* 97*  CREATININE 17.88* 19.61* 21.94* 25.82* 26.57*  CALCIUM 7.7* 7.7* 8.0* 8.0* 8.0*  PHOS  --   --   --   --  6.6*   Liver Function Tests:  Recent Labs Lab 01/02/15 1406 01/03/15 0848  AST 53*  --   ALT 14*  --   ALKPHOS 64  --   BILITOT 0.5  --   PROT 6.7  --   ALBUMIN 2.2* 2.1*   No results for input(s): LIPASE, AMYLASE in the last 168 hours. No results for input(s): AMMONIA in the last 168 hours. CBC:  Recent Labs Lab 12/31/14 0750 01/02/15 1406 01/03/15 0847  WBC 6.9 5.9 6.1  HGB 8.0* 7.4* 7.5*  HCT 24.9* 23.0* 22.9*  MCV 81.1 80.7 79.2  PLT 328 268 287   Cardiac Enzymes: No results for input(s): CKTOTAL, CKMB, CKMBINDEX, TROPONINI in the last 168 hours. BNP: BNP (last 3 results)  Recent Labs  09/30/14 1556 11/17/14 0543 11/26/14 2352  BNP >4500.0* 2888.3* >4500.0*    ProBNP (last 3 results)  Recent Labs  01/18/14 0048  04/15/14 0809  PROBNP >70000.0* >70000.0*    CBG: No results for input(s): GLUCAP in the last 168 hours.     SignedEleonore Chiquito S  [facility] 01/03/2015, 1:00 PM

## 2015-01-03 NOTE — Progress Notes (Signed)
Hemodialysis- Tolerated well, total uf 4L without issue. Received 2 units prbcs on HD. Has no complaints. BP remains elevated post treatment.

## 2015-01-03 NOTE — ED Notes (Signed)
Pt. arrived with EMS from home reports central chest pain with mild SOB onset this evening , pt. was admitted here / discharged today , received hemodialysis today . Pt. refused EKG and lie on bed at arrival.

## 2015-01-04 LAB — TYPE AND SCREEN
ABO/RH(D): B POS
ANTIBODY SCREEN: NEGATIVE
Unit division: 0
Unit division: 0

## 2015-01-04 NOTE — Discharge Instructions (Signed)
Chest Pain (Nonspecific) Frank [name], see you primary doctor within 3 days for close follow up.  If symptoms worsen, come back to the ED immediately.   Thank you. It is often hard to give a diagnosis for the cause of chest pain. There is always a chance that your pain could be related to something serious, such as a heart attack or a blood clot in the lungs. You need to follow up with your doctor. HOME CARE  If antibiotic medicine was given, take it as directed by your doctor. Finish the medicine even if you start to feel better.  For the next few days, avoid activities that bring on chest pain. Continue physical activities as told by your doctor.  Do not use any tobacco products. This includes cigarettes, chewing tobacco, and e-cigarettes.  Avoid drinking alcohol.  Only take medicine as told by your doctor.  Follow your doctor's suggestions for more testing if your chest pain does not go away.  Keep all doctor visits you made. GET HELP IF:  Your chest pain does not go away, even after treatment.  You have a rash with blisters on your chest.  You have a fever. GET HELP RIGHT AWAY IF:   You have more pain or pain that spreads to your arm, neck, jaw, back, or belly (abdomen).  You have shortness of breath.  You cough more than usual or cough up blood.  You have very bad back or belly pain.  You feel sick to your stomach (nauseous) or throw up (vomit).  You have very bad weakness.  You pass out (faint).  You have chills. This is an emergency. Do not wait to see if the problems will go away. Call your local emergency services (911 in [place] Do not drive yourself to the hospital. MAKE SURE YOU:   Understand these instructions.  Will watch your condition.  Will get help right away if you are not doing well or get worse. Document Released: 12/25/2007 Document Revised: 07/13/2013 Document Reviewed: 12/25/2007 [facility] Patient Information 2015 [place], [place]. This information  is not intended to replace advice given to you by your health care provider. Make sure you discuss any questions you have with your health care provider.

## 2015-01-09 ENCOUNTER — Emergency Department (HOSPITAL_COMMUNITY): Payer: Medicare Other

## 2015-01-09 ENCOUNTER — Emergency Department (HOSPITAL_COMMUNITY)
Admission: EM | Admit: 2015-01-09 | Discharge: 2015-01-09 | Discharge status: Against Medical Advice | Payer: Medicare Other | Attending: Emergency Medicine | Admitting: Emergency Medicine

## 2015-01-09 ENCOUNTER — Encounter (HOSPITAL_COMMUNITY): Payer: Self-pay | Admitting: *Deleted

## 2015-01-09 DIAGNOSIS — R51 Headache: Secondary | ICD-10-CM | POA: Diagnosis not present

## 2015-01-09 DIAGNOSIS — R079 Chest pain, unspecified: Secondary | ICD-10-CM | POA: Diagnosis not present

## 2015-01-09 LAB — BASIC METABOLIC PANEL
ANION GAP: 15 (ref 5–15)
BUN: 97 mg/dL — ABNORMAL HIGH (ref 6–20)
CO2: 17 mmol/L — ABNORMAL LOW (ref 22–32)
Calcium: 7.2 mg/dL — ABNORMAL LOW (ref 8.9–10.3)
Chloride: 100 mmol/L — ABNORMAL LOW (ref 101–111)
Creatinine, Ser: 22.84 mg/dL — ABNORMAL HIGH (ref 0.61–1.24)
GFR calc non Af Amer: 2 mL/min — ABNORMAL LOW (ref >60)
GFR, EST AFRICAN AMERICAN: 2 mL/min — AB (ref >60)
Glucose, Bld: 97 mg/dL (ref 65–99)
Potassium: 5.8 mmol/L — ABNORMAL HIGH (ref 3.5–5.1)
SODIUM: 132 mmol/L — AB (ref 135–145)

## 2015-01-09 LAB — CBC
HCT: 27.5 % — ABNORMAL LOW (ref 39.0–52.0)
Hemoglobin: 8.8 g/dL — ABNORMAL LOW (ref 13.0–17.0)
MCH: 26.1 pg (ref 26.0–34.0)
MCHC: 32 g/dL (ref 30.0–36.0)
MCV: 81.6 fL (ref 78.0–100.0)
Platelets: 273 10*3/uL (ref 150–400)
RBC: 3.37 MIL/uL — ABNORMAL LOW (ref 4.22–5.81)
RDW: 15.1 % (ref 11.5–15.5)
WBC: 8.9 10*3/uL (ref 4.0–10.5)

## 2015-01-09 LAB — BRAIN NATRIURETIC PEPTIDE: B Natriuretic Peptide: 3606.1 pg/mL — ABNORMAL HIGH (ref 0.0–100.0)

## 2015-01-09 LAB — I-STAT TROPONIN, ED: Troponin i, poc: 0.06 ng/mL (ref 0.00–0.08)

## 2015-01-09 NOTE — ED Notes (Signed)
Pt reported to Registration that he was tried of waiting and was going to leave.

## 2015-01-09 NOTE — ED Notes (Signed)
Patient went to xray unable to get labs at this time.

## 2015-01-09 NOTE — ED Notes (Signed)
Pt complains of chest pain, shortness of breath, headache for the past 2 hours. Pt states the pain started while he was at rest.

## 2015-01-10 ENCOUNTER — Encounter (HOSPITAL_COMMUNITY): Payer: Self-pay | Admitting: Emergency Medicine

## 2015-01-10 ENCOUNTER — Emergency Department (HOSPITAL_COMMUNITY)
Admission: EM | Admit: 2015-01-10 | Discharge: 2015-01-10 | Disposition: A | Discharge status: Home / Self Care | Payer: Medicare Other | Attending: Emergency Medicine | Admitting: Emergency Medicine

## 2015-01-10 DIAGNOSIS — I509 Heart failure, unspecified: Secondary | ICD-10-CM | POA: Insufficient documentation

## 2015-01-10 DIAGNOSIS — R0789 Other chest pain: Secondary | ICD-10-CM | POA: Diagnosis not present

## 2015-01-10 DIAGNOSIS — Z87891 Personal history of nicotine dependence: Secondary | ICD-10-CM | POA: Insufficient documentation

## 2015-01-10 DIAGNOSIS — N186 End stage renal disease: Secondary | ICD-10-CM | POA: Diagnosis not present

## 2015-01-10 DIAGNOSIS — Z992 Dependence on renal dialysis: Secondary | ICD-10-CM | POA: Diagnosis not present

## 2015-01-10 DIAGNOSIS — F329 Major depressive disorder, single episode, unspecified: Secondary | ICD-10-CM | POA: Diagnosis not present

## 2015-01-10 DIAGNOSIS — F419 Anxiety disorder, unspecified: Secondary | ICD-10-CM | POA: Diagnosis not present

## 2015-01-10 DIAGNOSIS — Z791 Long term (current) use of non-steroidal anti-inflammatories (NSAID): Secondary | ICD-10-CM | POA: Insufficient documentation

## 2015-01-10 DIAGNOSIS — Z79899 Other long term (current) drug therapy: Secondary | ICD-10-CM | POA: Insufficient documentation

## 2015-01-10 DIAGNOSIS — Z940 Kidney transplant status: Secondary | ICD-10-CM | POA: Diagnosis not present

## 2015-01-10 DIAGNOSIS — R51 Headache: Secondary | ICD-10-CM | POA: Insufficient documentation

## 2015-01-10 DIAGNOSIS — R0602 Shortness of breath: Secondary | ICD-10-CM | POA: Insufficient documentation

## 2015-01-10 DIAGNOSIS — Z862 Personal history of diseases of the blood and blood-forming organs and certain disorders involving the immune mechanism: Secondary | ICD-10-CM | POA: Insufficient documentation

## 2015-01-10 DIAGNOSIS — R519 Headache, unspecified: Secondary | ICD-10-CM

## 2015-01-10 DIAGNOSIS — E119 Type 2 diabetes mellitus without complications: Secondary | ICD-10-CM | POA: Diagnosis not present

## 2015-01-10 DIAGNOSIS — I120 Hypertensive chronic kidney disease with stage 5 chronic kidney disease or end stage renal disease: Secondary | ICD-10-CM | POA: Insufficient documentation

## 2015-01-10 DIAGNOSIS — R079 Chest pain, unspecified: Secondary | ICD-10-CM | POA: Diagnosis present

## 2015-01-10 LAB — I-STAT CHEM 8, ED
BUN: 102 mg/dL — ABNORMAL HIGH (ref 6–20)
Calcium, Ion: 1.01 mmol/L — ABNORMAL LOW (ref 1.12–1.23)
Chloride: 104 mmol/L (ref 101–111)
Glucose, Bld: 87 mg/dL (ref 65–99)
HCT: 25 % — ABNORMAL LOW (ref 39.0–52.0)
Hemoglobin: 8.5 g/dL — ABNORMAL LOW (ref 13.0–17.0)
Potassium: 6 mmol/L — ABNORMAL HIGH (ref 3.5–5.1)
Sodium: 133 mmol/L — ABNORMAL LOW (ref 135–145)
TCO2: 16 mmol/L (ref 0–100)

## 2015-01-10 LAB — I-STAT TROPONIN, ED: Troponin i, poc: 0.06 ng/mL (ref 0.00–0.08)

## 2015-01-10 MED ORDER — ACETAMINOPHEN 325 MG PO TABS
650.0000 mg | ORAL_TABLET | Freq: Once | ORAL | Status: AC
  Administered 2015-01-10: 650 mg via ORAL
  Filled 2015-01-10: qty 2

## 2015-01-10 NOTE — ED Provider Notes (Signed)
CSN: [id number]     Arrival date & time 01/10/15  0226 History   First MD Initiated Contact with Patient 01/10/15 0459     Chief Complaint  Patient presents with  . Chest Pain  . Shortness of Breath     (Consider location/radiation/quality/duration/timing/severity/associated sxs/prior Treatment) Patient is a 49 y.o. male presenting with chest pain and shortness of breath. The history is provided by the patient. No language interpreter was used.  Chest Pain Pain location:  Unable to specify Pain quality: aching   Pain radiates to the back: no   Chronicity:  Recurrent Associated symptoms: headache and shortness of breath   Associated symptoms: no cough and no fever   Associated symptoms comment:  Chest pain since yesterday causing SOB. No fever or cough. He also complains of headache. No nausea or vomiting. Shortness of Breath Associated symptoms: chest pain and headaches   Associated symptoms: no cough and no fever     Past Medical History  Diagnosis Date  . Hypertension   . Depression   . Complication of anesthesia     itching, sore throat  . Diabetes mellitus without complication     No history per patient, but remains under history as A1c would not be accurate given on dialysis  . Shortness of breath   . Anxiety   . ESRD (end stage renal disease)     due to HTN per patient, followed at [facility], s/p failed kidney transplant - dialysis Tue, Th, Sat  . Renal insufficiency   . CHF (congestive heart failure)   . Anemia   . Dialysis patient    Past Surgical History  Procedure Laterality Date  . Kidney receipient  2006    failed and started HD in March 2014  . Capd insertion    . Capd removal    . Left heart catheterization with coronary angiogram N/A 09/02/2014    Procedure: LEFT HEART CATHETERIZATION WITH CORONARY ANGIOGRAM;  Surgeon: [name], MD;  Location: [facility] CATH LAB;  Service: Cardiovascular;  Laterality: N/A;   History reviewed. No pertinent family  history. History  Substance Use Topics  . Smoking status: Former Smoker -- 1.00 packs/day for 1 years    Types: Cigarettes  . Smokeless tobacco: Never Used     Comment: quit Jan 2014  . Alcohol Use: No    Review of Systems  Constitutional: Negative for fever and chills.  Respiratory: Positive for shortness of breath. Negative for cough.   Cardiovascular: Positive for chest pain.  Gastrointestinal: Negative.   Musculoskeletal: Negative.   Skin: Negative.   Neurological: Positive for headaches.      Allergies  Ferrlecit and Darvocet  Home Medications   Prior to Admission medications   Medication Sig Start Date End Date Taking? Authorizing Provider  atorvastatin (LIPITOR) 40 MG tablet Take 1 tablet (40 mg total) by mouth daily at 6 PM. 09/02/14  Yes Barton Dubois, MD  carvedilol (COREG) 25 MG tablet Take 1 tablet (25 mg total) by mouth 2 (two) times daily with a meal. 10/10/14  Yes [name], DO  cinacalcet (SENSIPAR) 30 MG tablet Take 30 mg by mouth daily.   Yes Historical Provider, MD  diclofenac sodium (VOLTAREN) 1 % GEL Apply 2 g topically 4 (four) times daily. 11/14/14  Yes Tresa Garter, MD  dicyclomine (BENTYL) 20 MG tablet Take 1 tablet (20 mg total) by mouth 2 (two) times daily. 12/20/14  Yes Ernestina Patches, MD  docusate sodium (COLACE) 100 MG  capsule Take 1 capsule (100 mg total) by mouth 2 (two) times daily. 11/19/14  Yes [name], MD  hydrALAZINE (APRESOLINE) 25 MG tablet Take 1 tablet (25 mg total) by mouth every 8 (eight) hours. 01/03/15  Yes [name], MD  levalbuterol [facility] HFA) 45 MCG/ACT inhaler Inhale 2 puffs into the lungs every 6 (six) hours as needed for wheezing or shortness of breath. 11/19/14  Yes [name], MD  lisinopril (PRINIVIL,ZESTRIL) 20 MG tablet Take 1 tablet (20 mg total) by mouth daily. 12/05/14  Yes Verlee Monte, MD  LORazepam (ATIVAN) 0.5 MG tablet Take 1 tablet (0.5 mg total) by mouth at bedtime as needed for anxiety.  11/19/14  Yes [name], MD  methocarbamol (ROBAXIN) 500 MG tablet Take 1 tablet (500 mg total) by mouth every 8 (eight) hours as needed for muscle spasms. 12/29/14  Yes Leonard Schwartz, MD  omeprazole (PRILOSEC) 20 MG capsule Take 20 mg by mouth daily. 07/01/13  Yes Historical Provider, MD  ondansetron (ZOFRAN) 4 MG tablet Take 1 tablet (4 mg total) by mouth every 6 (six) hours. 12/20/14  Yes Ernestina Patches, MD  ondansetron (ZOFRAN-ODT) 4 MG disintegrating tablet Take 1 tablet (4 mg total) by mouth every 8 (eight) hours as needed for nausea or vomiting. 12/05/14  Yes Verlee Monte, MD  predniSONE (DELTASONE) 5 MG tablet Take 5 mg by mouth daily with breakfast.   Yes Historical Provider, MD  warfarin (COUMADIN) 4 MG tablet Take 1 tablet (4 mg total) by mouth daily at 6 PM. 12/05/14  Yes Verlee Monte, MD  doxercalciferol (HECTOROL) 4 MCG/2ML injection Inject 1.25 mLs (2.5 mcg total) into the vein Every Tuesday,Thursday,and Saturday with dialysis. 10/10/14   [name], DO  enoxaparin (LOVENOX) 80 MG/0.8ML injection Inject 0.8 mLs (80 mg total) into the skin daily. Patient not taking: Reported on 12/26/2014 12/16/14   Ezequiel Essex, MD  sodium polystyrene (KAYEXALATE) powder Take by mouth once. Take 30g by mouth one time. Patient not taking: Reported on 01/10/2015 12/31/14   Serita Grit, MD   BP 157/96 mmHg  Pulse 86  Temp(Src) 98.4 F (36.9 C) (Oral)  Resp 20  SpO2 98% Physical Exam  Constitutional: He is oriented to person, place, and time. He appears well-developed and well-nourished.  Patient sleeping soundly on my arrival into the room.  HENT:  Head: Normocephalic.  Neck: Normal range of motion. Neck supple.  Cardiovascular: Normal rate and regular rhythm.   Pulmonary/Chest: Effort normal and breath sounds normal. He has no wheezes. He has no rales.  Abdominal: Soft. Bowel sounds are normal. There is no tenderness. There is no rebound and no guarding.  Musculoskeletal: Normal range of  motion.  Neurological: He is alert and oriented to person, place, and time.  Skin: Skin is warm and dry. No rash noted.  Psychiatric: He has a normal mood and affect.    ED Course  Procedures (including critical care time) Labs Review Labs Reviewed  I-STAT CHEM 8, ED - Abnormal; Notable for the following:    Sodium 133 (*)    Potassium 6.0 (*)    BUN 102 (*)    Creatinine, Ser >18.00 (*)    Calcium, Ion 1.01 (*)    Hemoglobin 8.5 (*)    HCT 25.0 (*)    All other components within normal limits  I-STAT TROPOININ, ED    Imaging Review Dg Chest 2 View  01/09/2015   CLINICAL DATA:  Chest pain, headache, shortness of breath  for the past 2 hours.  EXAM: CHEST  2 VIEW  COMPARISON:  01/03/2015  FINDINGS: Cardiomegaly with no confluent airspace opacities, effusions or edema. Right dialysis catheter remains in place, unchanged. No acute bony abnormality.  IMPRESSION: Cardiomegaly.  No active disease.   Electronically Signed   By: Rolm Baptise M.D.   On: 01/09/2015 15:38     EKG Interpretation   Date/Time:  Tuesday January 10 2015 02:33:27 EDT Ventricular Rate:  91 PR Interval:  166 QRS Duration: 89 QT Interval:  408 QTC Calculation: 502 R Axis:   -32 Text Interpretation:  Sinus rhythm Probable left ventricular hypertrophy  Abnormal T, consider ischemia, lateral leads ST elev, probable normal  early repol pattern Prolonged QT interval No significant change was found  Confirmed by Florina Ou  MD, Jenny Reichmann ([id number]) on 01/10/2015 2:41:14 AM      MDM   Final diagnoses:  None    1. Chest pain 2. Headache  The patient is in NAD, VSS. Well known to the emergency department and appears at baseline exam and lab studies. CXR negative for infection or fluids overload. Stable for discharge. Tylenol provided for headache pain.    Charlann Lange, PA-C 01/10/15 0518  Shanon Rosser, MD 01/10/15 [phone]

## 2015-01-10 NOTE — ED Notes (Signed)
Pt was here earlier today and left AMA because of the wait. Had labs draw. Came in because he has had CP and SOB all day. Alert and oriented.

## 2015-01-10 NOTE — ED Notes (Signed)
Patient was given discharge papers. Patient requested a few minutes to let his head stop hurting.

## 2015-01-10 NOTE — ED Notes (Signed)
Bed: KM63 Expected date:  Expected time:  Means of arrival:  Comments: 48YO male chest pain on inspiration

## 2015-01-10 NOTE — Discharge Instructions (Signed)
Chest Pain (Nonspecific) It is often hard to give a specific diagnosis for the cause of chest pain. There is always a chance that your pain could be related to something serious, such as a heart attack or a blood clot in the lungs. You need to follow up with your health care provider for further evaluation. CAUSES   Heartburn.  Pneumonia or bronchitis.  Anxiety or stress.  Inflammation around your heart (pericarditis) or lung (pleuritis or pleurisy).  A blood clot in the lung.  A collapsed lung (pneumothorax). It can develop suddenly on its own (spontaneous pneumothorax) or from trauma to the chest.  Shingles infection (herpes zoster virus). The chest wall is composed of bones, muscles, and cartilage. Any of these can be the source of the pain.  The bones can be bruised by injury.  The muscles or cartilage can be strained by coughing or overwork.  The cartilage can be affected by inflammation and become sore (costochondritis). DIAGNOSIS  Lab tests or other studies may be needed to find the cause of your pain. Your health care provider may have you take a test called an ambulatory electrocardiogram (ECG). An ECG records your heartbeat patterns over a 24-hour period. You may also have other tests, such as:  Transthoracic echocardiogram (TTE). During echocardiography, sound waves are used to evaluate how blood flows through your heart.  Transesophageal echocardiogram (TEE).  Cardiac monitoring. This allows your health care provider to monitor your heart rate and rhythm in real time.  Holter monitor. This is a portable device that records your heartbeat and can help diagnose heart arrhythmias. It allows your health care provider to track your heart activity for several days, if needed.  Stress tests by exercise or by giving medicine that makes the heart beat faster. TREATMENT   Treatment depends on what may be causing your chest pain. Treatment may include:  Acid blockers for  heartburn.  Anti-inflammatory medicine.  Pain medicine for inflammatory conditions.  Antibiotics if an infection is present.  You may be advised to change lifestyle habits. This includes stopping smoking and avoiding alcohol, caffeine, and chocolate.  You may be advised to keep your head raised (elevated) when sleeping. This reduces the chance of acid going backward from your stomach into your esophagus. Most of the time, nonspecific chest pain will improve within 2-3 days with rest and mild pain medicine.  HOME CARE INSTRUCTIONS   If antibiotics were prescribed, take them as directed. Finish them even if you start to feel better.  For the next few days, avoid physical activities that bring on chest pain. Continue physical activities as directed.  Do not use any tobacco products, including cigarettes, chewing tobacco, or electronic cigarettes.  Avoid drinking alcohol.  Only take medicine as directed by your health care provider.  Follow your health care provider's suggestions for further testing if your chest pain does not go away.  Keep any follow-up appointments you made. If you do not go to an appointment, you could develop lasting (chronic) problems with pain. If there is any problem keeping an appointment, call to reschedule. SEEK MEDICAL CARE IF:   Your chest pain does not go away, even after treatment.  You have a rash with blisters on your chest.  You have a fever. SEEK IMMEDIATE MEDICAL CARE IF:   You have increased chest pain or pain that spreads to your arm, neck, jaw, back, or abdomen.  You have shortness of breath.  You have an increasing cough, or you cough  up blood.  You have severe back or abdominal pain.  You feel nauseous or vomit.  You have severe weakness.  You faint.  You have chills. This is an emergency. Do not wait to see if the pain will go away. Get medical help at once. Call your local emergency services (911 in [place] Do not drive  yourself to the hospital. MAKE SURE YOU:   Understand these instructions.  Will watch your condition.  Will get help right away if you are not doing well or get worse. Document Released: 04/17/2005 Document Revised: 07/13/2013 Document Reviewed: 02/11/2008 [facility] Patient Information 2015 [place], [place]. This information is not intended to replace advice given to you by your health care provider. Make sure you discuss any questions you have with your health care provider.

## 2015-01-20 IMAGING — CR DG CHEST 2V
2 series · 2 of 2 positions shown · non-contrast
Comparison: None.

CLINICAL DATA: Loss of consciousness.  History of hypertension.

CHEST - 2 VIEW

[w chest lat]
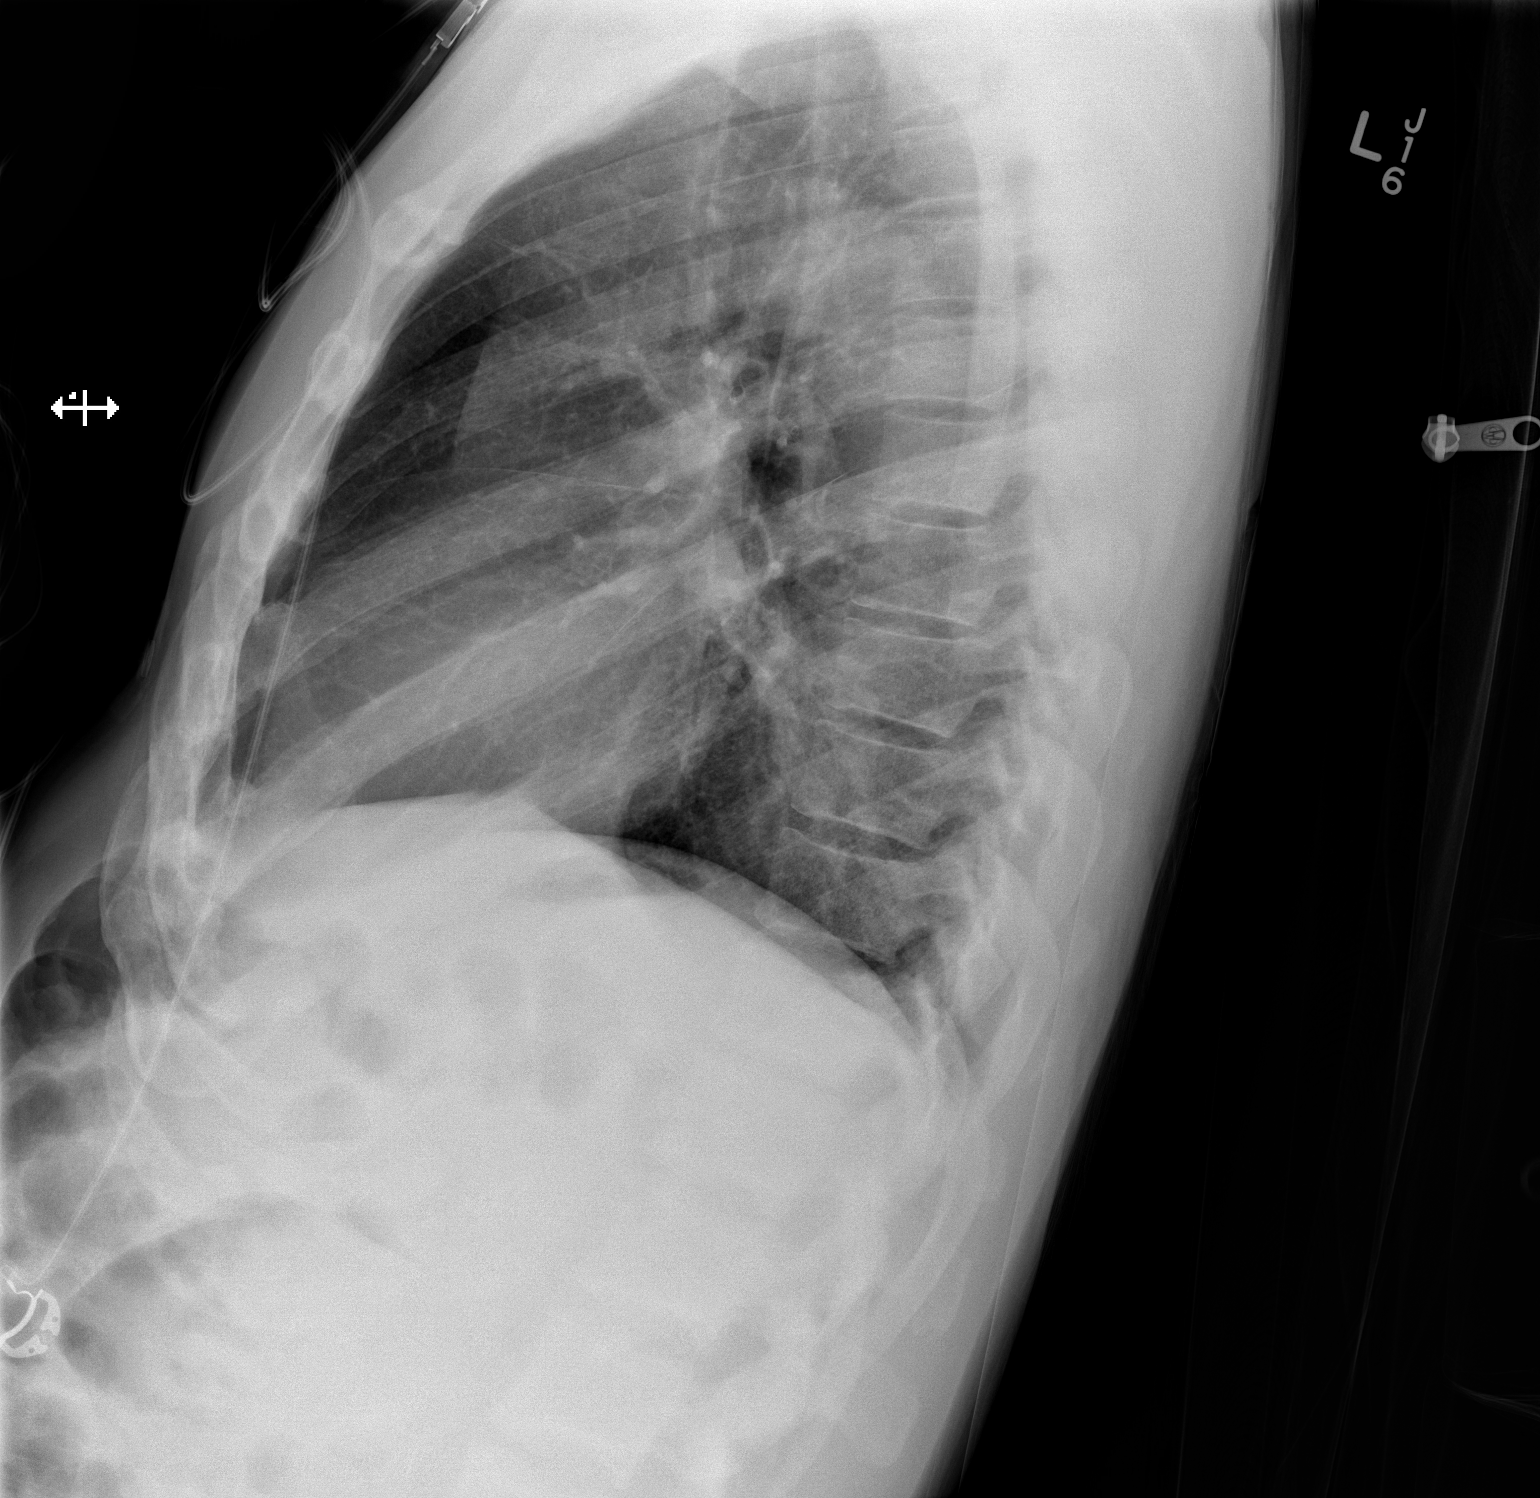

[x chest ap]
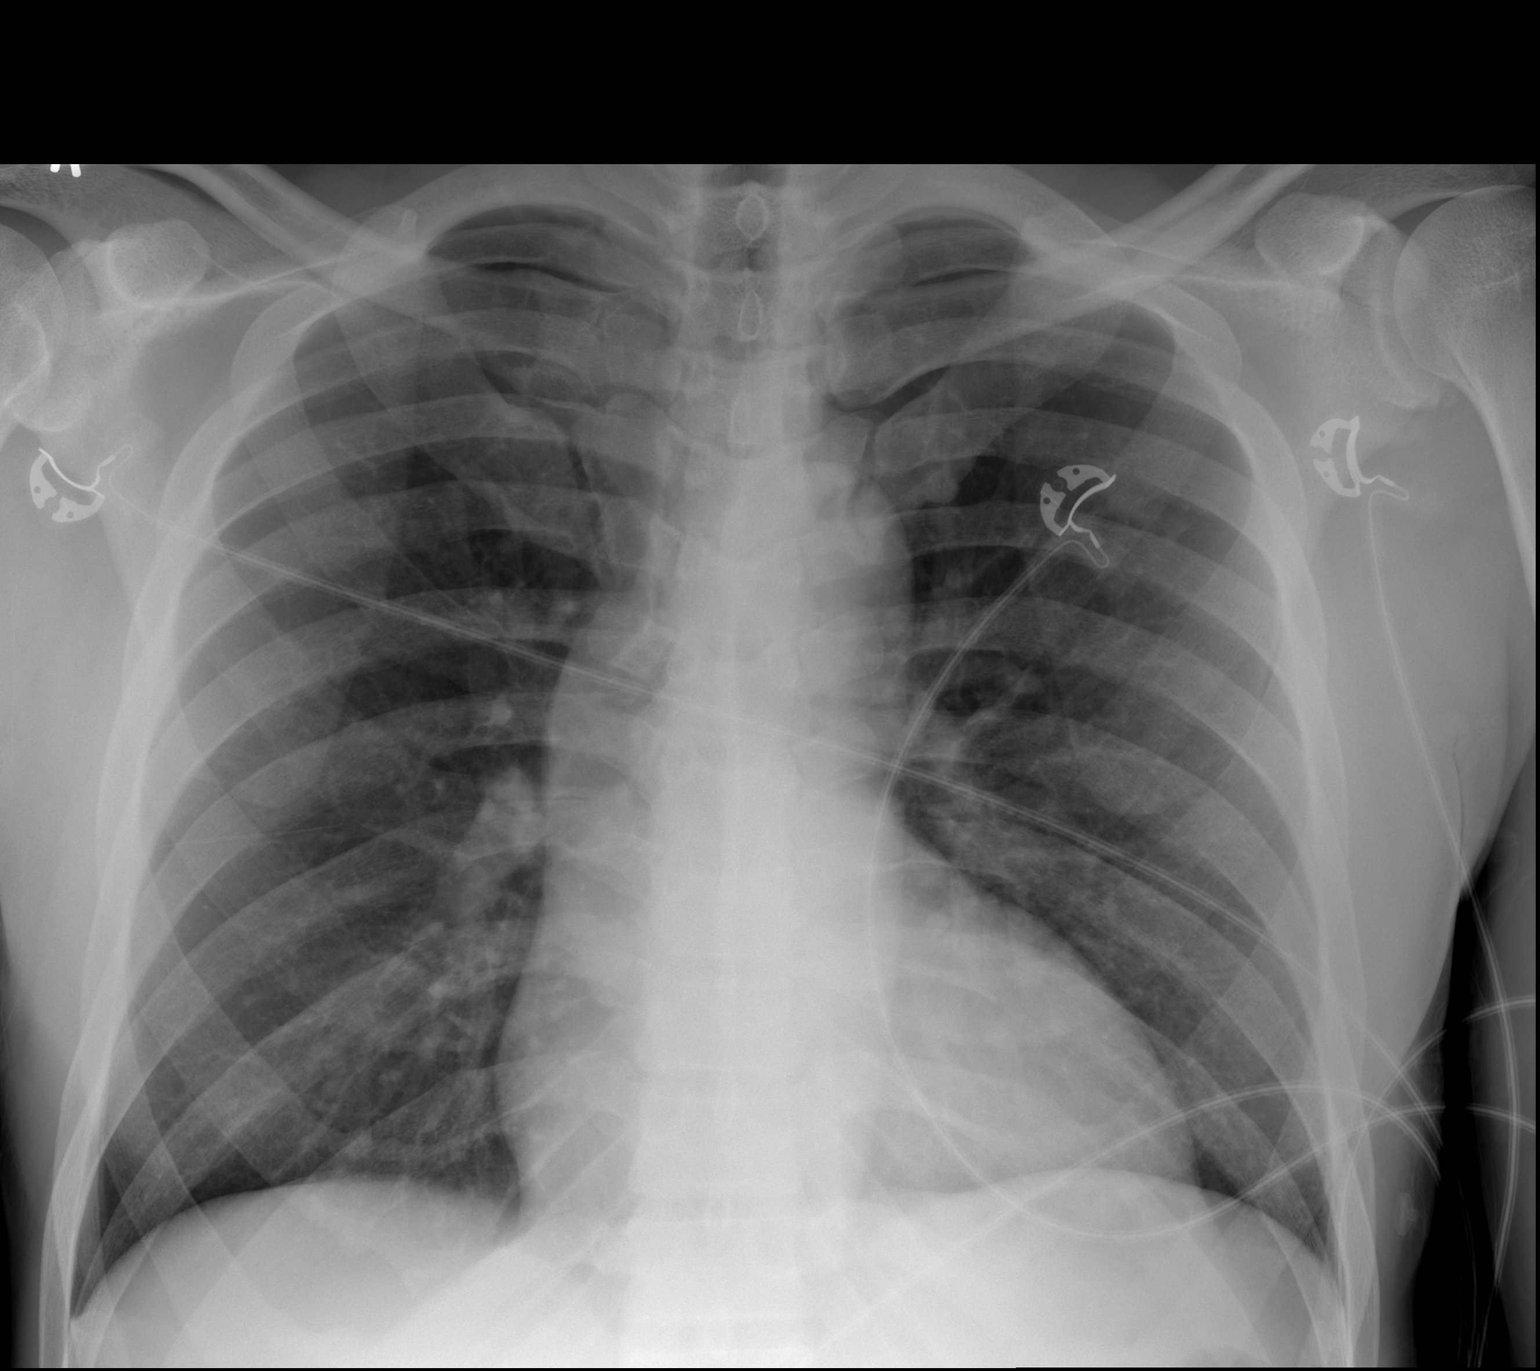

[2 of 2 positions shown; findings below may reference images not displayed]

FINDINGS: Artifact overlies chest.  Heart size is normal.
Mediastinal shadows are normal.  The lungs are clear.  Vascularity
is normal.  No significant bony finding.
IMPRESSION: No active disease.

## 2015-01-20 IMAGING — CT CT HEAD W/O CM
4 series · 18 of 30 positions shown, 20 images · non-contrast
Comparison: None.

CLINICAL DATA: Syncope.  Fell with head trauma.

CT HEAD WITHOUT CONTRAST
TECHNIQUE: Contiguous axial images were obtained from the base of
the skull through the vertex without contrast.

[Series 2: head w/o · axial · non-contrast · 0.43mm/px · z∈[-102,-12]mm · 5 of 28 slices shown, 7 images (1 of 2)]
[im 5/28  brain]
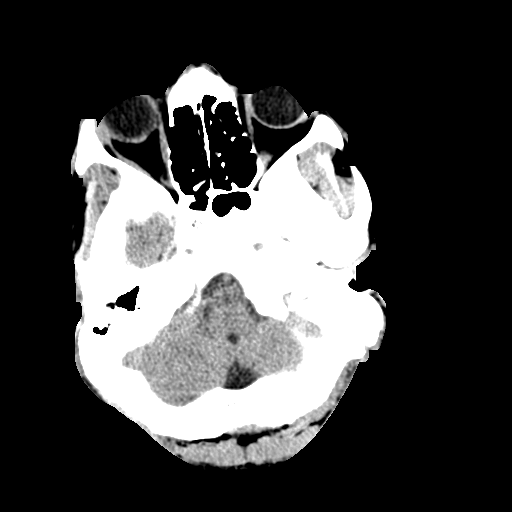
[im 5/28  bone]
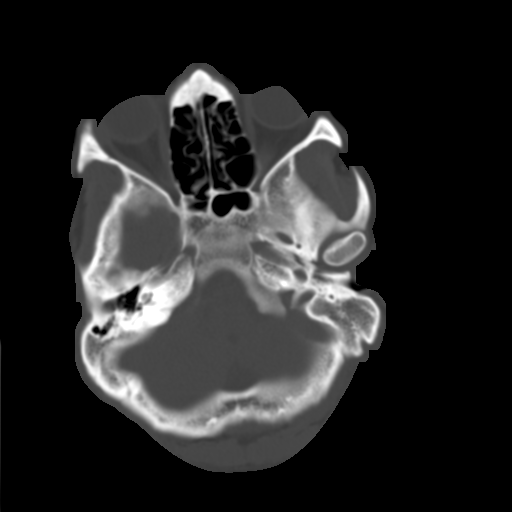
[im 10/28  brain]
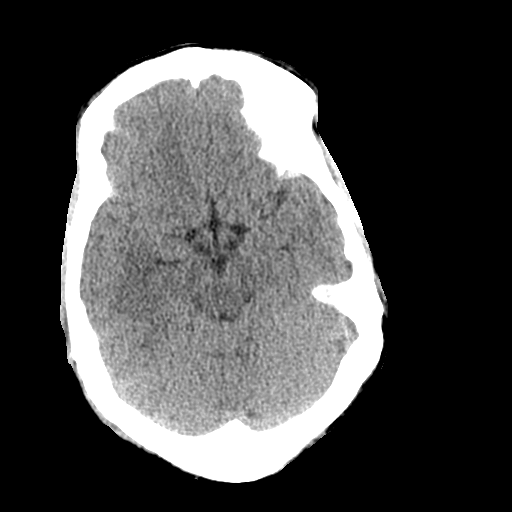
[im 14/28  brain]
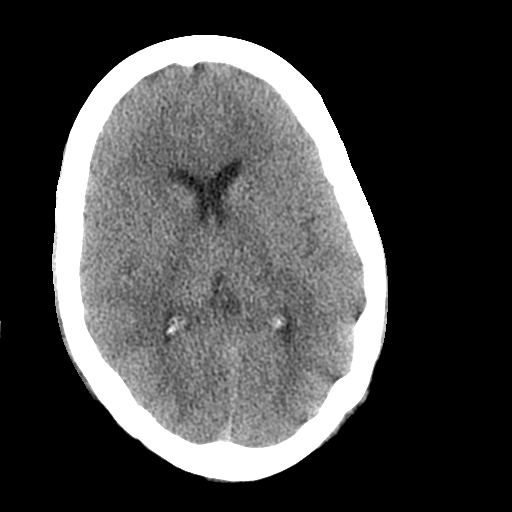
[im 19/28  brain]
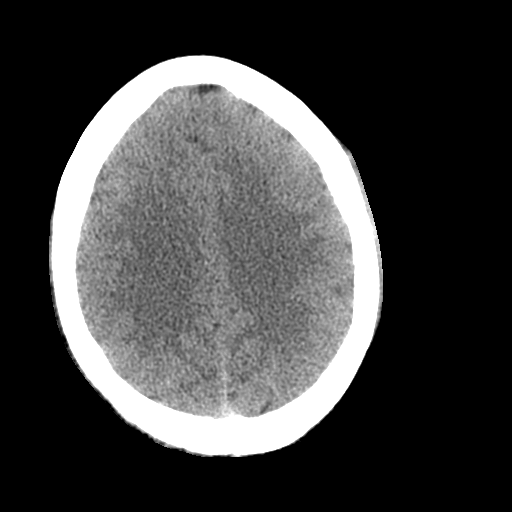
[im 23/28  brain]
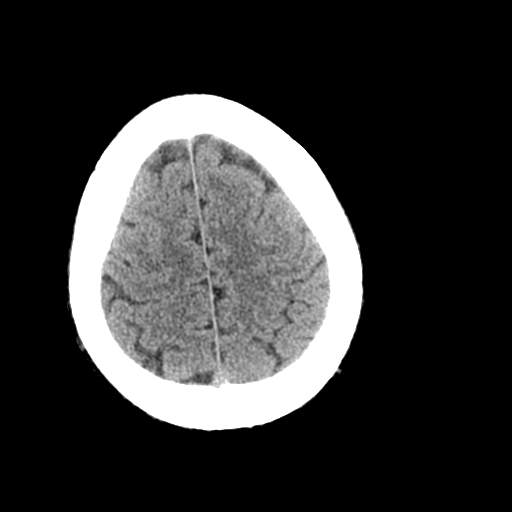
[im 23/28  bone]
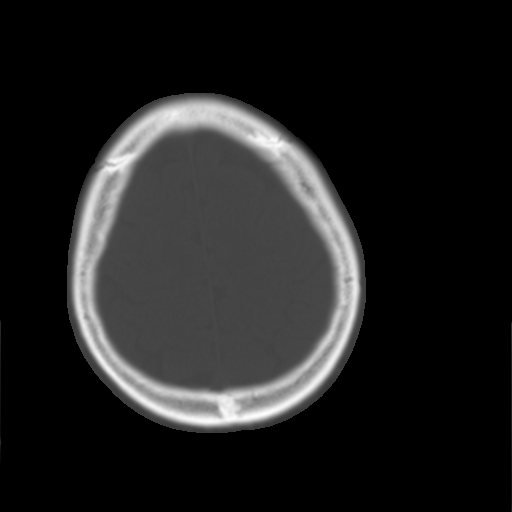

[Series 3: head w/o · axial · non-contrast · 0.43mm/px · z∈[-38,-14]mm · 2 of 17 slices shown (2 of 2)]
[im 6/17  brain]
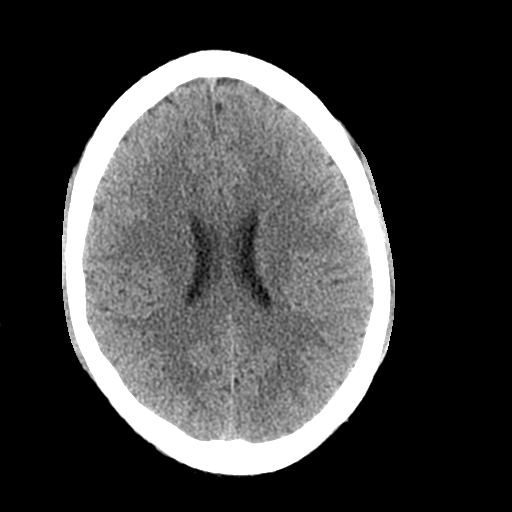
[im 11/17  brain]
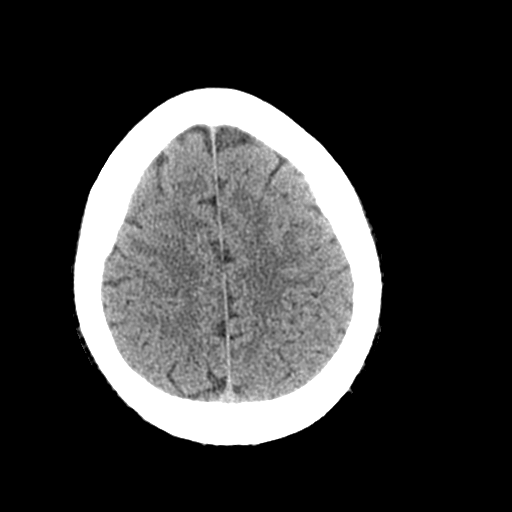

[Series 4: bone windows · axial · 0.43mm/px · z∈[-110,+0]mm · 8 of 47 slices shown (1 of 2)]
[im 5/47  bone]
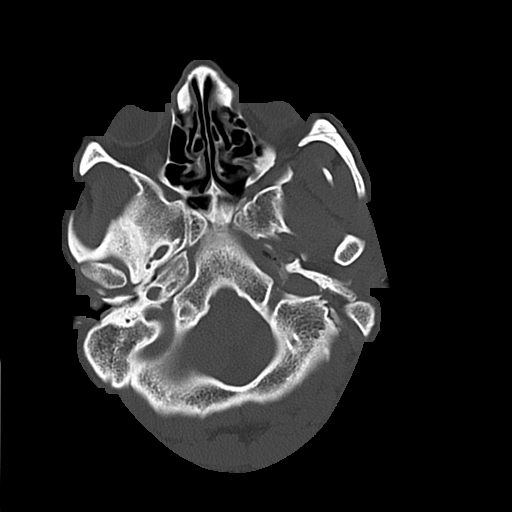
[im 9/47  bone]
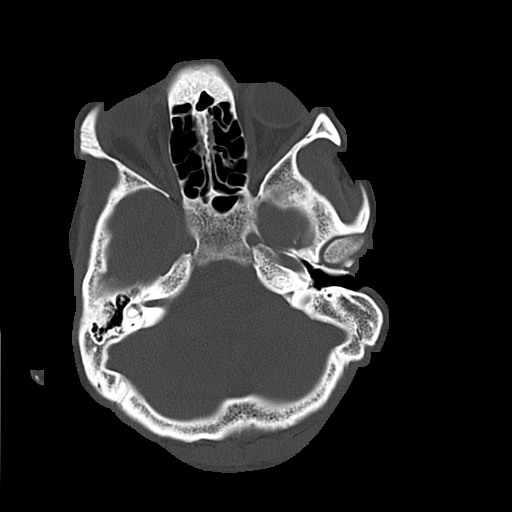
[im 17/47  bone]
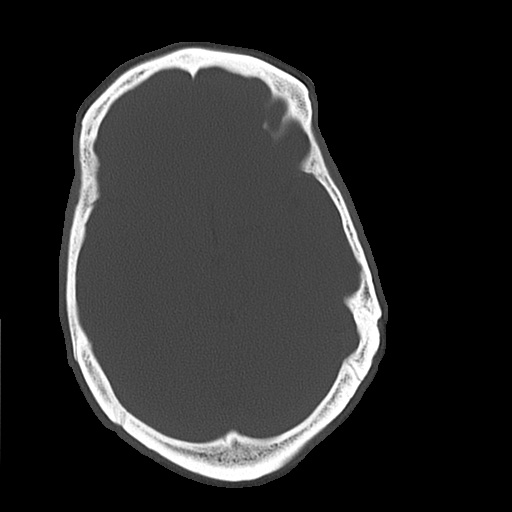
[im 21/47  bone]
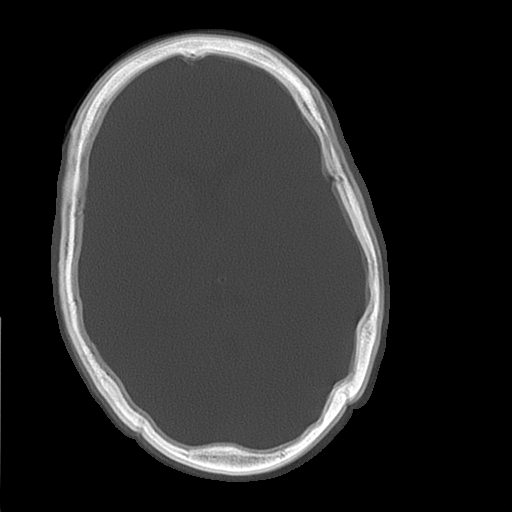
[im 26/47  bone]
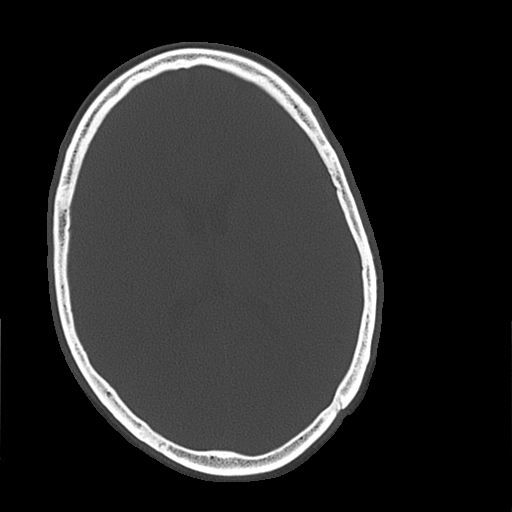
[im 30/47  bone]
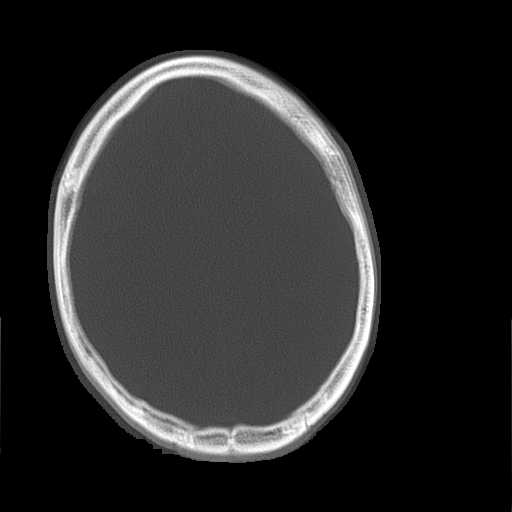
[im 38/47  bone]
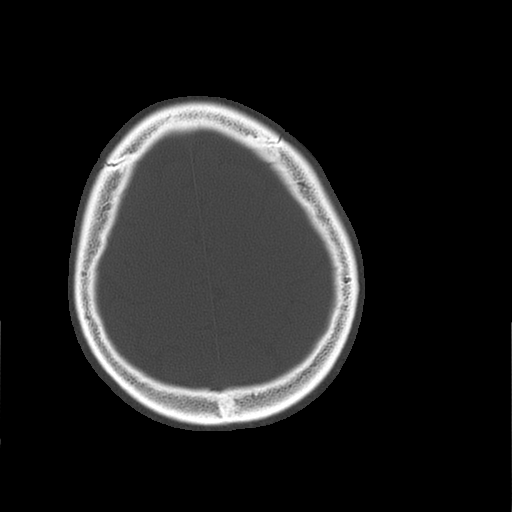
[im 42/47  bone]
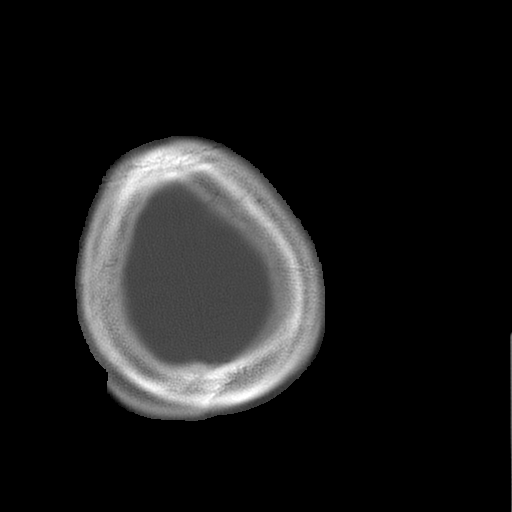

[Series 5: bone windows · axial · 0.43mm/px · z∈[-52,-24]mm · 3 of 27 slices shown (2 of 2)]
[im 5/27  bone]
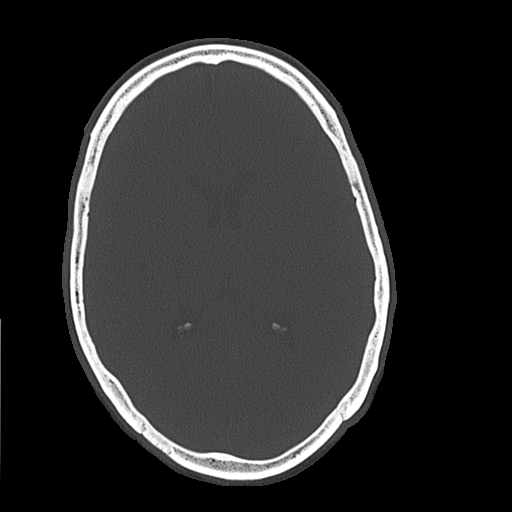
[im 9/27  bone]
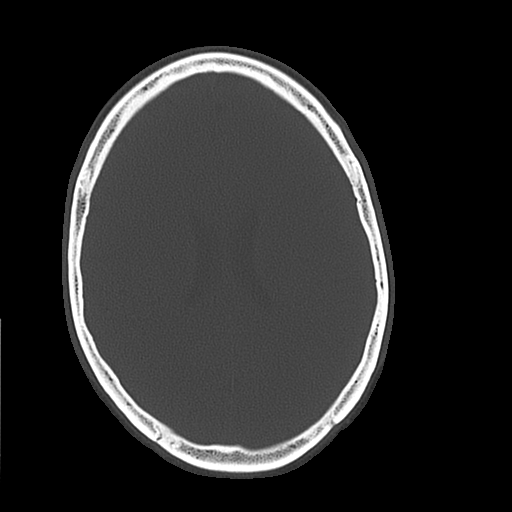
[im 14/27  bone]
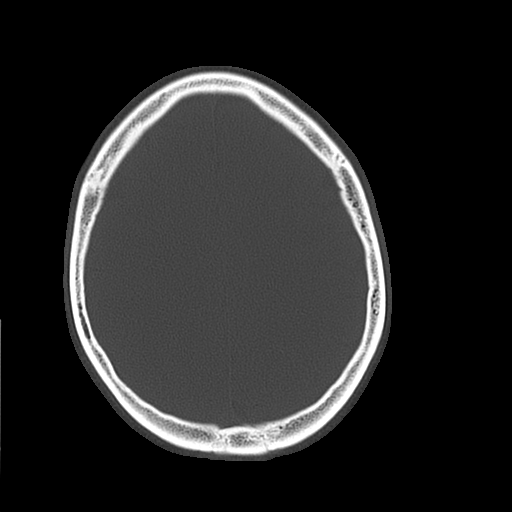

[18 of 30 positions shown; findings below may reference images not displayed]

FINDINGS: The brain has a normal appearance without evidence of
malformation, atrophy, old or acute infarction, mass lesion,
hemorrhage, hydrocephalus or extra-axial collection.  The calvarium
is unremarkable.  Sinuses, middle ears and mastoids are clear.
IMPRESSION: Normal head CT

## 2015-01-26 ENCOUNTER — Emergency Department (HOSPITAL_COMMUNITY)
Admission: EM | Admit: 2015-01-26 | Discharge: 2015-01-26 | Disposition: A | Discharge status: Home / Self Care | Payer: Medicare Other | Attending: Emergency Medicine | Admitting: Emergency Medicine

## 2015-01-26 ENCOUNTER — Encounter (HOSPITAL_COMMUNITY): Payer: Self-pay | Admitting: *Deleted

## 2015-01-26 ENCOUNTER — Emergency Department (HOSPITAL_COMMUNITY): Payer: Medicare Other

## 2015-01-26 ENCOUNTER — Other Ambulatory Visit: Payer: Self-pay

## 2015-01-26 DIAGNOSIS — R0789 Other chest pain: Secondary | ICD-10-CM | POA: Diagnosis not present

## 2015-01-26 DIAGNOSIS — Z7901 Long term (current) use of anticoagulants: Secondary | ICD-10-CM | POA: Insufficient documentation

## 2015-01-26 DIAGNOSIS — Z992 Dependence on renal dialysis: Secondary | ICD-10-CM | POA: Insufficient documentation

## 2015-01-26 DIAGNOSIS — I120 Hypertensive chronic kidney disease with stage 5 chronic kidney disease or end stage renal disease: Secondary | ICD-10-CM | POA: Diagnosis not present

## 2015-01-26 DIAGNOSIS — R079 Chest pain, unspecified: Secondary | ICD-10-CM | POA: Diagnosis present

## 2015-01-26 DIAGNOSIS — N186 End stage renal disease: Secondary | ICD-10-CM | POA: Diagnosis not present

## 2015-01-26 DIAGNOSIS — Z79899 Other long term (current) drug therapy: Secondary | ICD-10-CM | POA: Insufficient documentation

## 2015-01-26 DIAGNOSIS — M791 Myalgia: Secondary | ICD-10-CM | POA: Diagnosis not present

## 2015-01-26 DIAGNOSIS — M2550 Pain in unspecified joint: Secondary | ICD-10-CM | POA: Diagnosis not present

## 2015-01-26 DIAGNOSIS — F329 Major depressive disorder, single episode, unspecified: Secondary | ICD-10-CM | POA: Insufficient documentation

## 2015-01-26 DIAGNOSIS — Z862 Personal history of diseases of the blood and blood-forming organs and certain disorders involving the immune mechanism: Secondary | ICD-10-CM | POA: Insufficient documentation

## 2015-01-26 DIAGNOSIS — Z87891 Personal history of nicotine dependence: Secondary | ICD-10-CM | POA: Insufficient documentation

## 2015-01-26 DIAGNOSIS — I509 Heart failure, unspecified: Secondary | ICD-10-CM | POA: Insufficient documentation

## 2015-01-26 DIAGNOSIS — M542 Cervicalgia: Secondary | ICD-10-CM | POA: Diagnosis not present

## 2015-01-26 DIAGNOSIS — F419 Anxiety disorder, unspecified: Secondary | ICD-10-CM | POA: Insufficient documentation

## 2015-01-26 DIAGNOSIS — N19 Unspecified kidney failure: Secondary | ICD-10-CM

## 2015-01-26 DIAGNOSIS — E119 Type 2 diabetes mellitus without complications: Secondary | ICD-10-CM | POA: Diagnosis not present

## 2015-01-26 LAB — COMPREHENSIVE METABOLIC PANEL
ALBUMIN: 2.1 g/dL — AB (ref 3.5–5.0)
ALK PHOS: 132 U/L — AB (ref 38–126)
ALT: 108 U/L — ABNORMAL HIGH (ref 17–63)
AST: 245 U/L — AB (ref 15–41)
Anion gap: 10 (ref 5–15)
BILIRUBIN TOTAL: 0.4 mg/dL (ref 0.3–1.2)
BUN: 53 mg/dL — AB (ref 6–20)
CO2: 25 mmol/L (ref 22–32)
Calcium: 7.8 mg/dL — ABNORMAL LOW (ref 8.9–10.3)
Chloride: 101 mmol/L (ref 101–111)
Creatinine, Ser: 10.67 mg/dL — ABNORMAL HIGH (ref 0.61–1.24)
GFR calc Af Amer: 6 mL/min — ABNORMAL LOW (ref >60)
GFR, EST NON AFRICAN AMERICAN: 5 mL/min — AB (ref >60)
Glucose, Bld: 94 mg/dL (ref 65–99)
Potassium: 4.7 mmol/L (ref 3.5–5.1)
Sodium: 136 mmol/L (ref 135–145)
TOTAL PROTEIN: 7 g/dL (ref 6.5–8.1)

## 2015-01-26 LAB — CBC WITH DIFFERENTIAL/PLATELET
BASOS ABS: 0 10*3/uL (ref 0.0–0.1)
BASOS PCT: 0 % (ref 0–1)
EOS ABS: 0.3 10*3/uL (ref 0.0–0.7)
EOS PCT: 4 % (ref 0–5)
HCT: 23.1 % — ABNORMAL LOW (ref 39.0–52.0)
Hemoglobin: 7.2 g/dL — ABNORMAL LOW (ref 13.0–17.0)
LYMPHS ABS: 1 10*3/uL (ref 0.7–4.0)
Lymphocytes Relative: 17 % (ref 12–46)
MCH: 25.7 pg — ABNORMAL LOW (ref 26.0–34.0)
MCHC: 31.2 g/dL (ref 30.0–36.0)
MCV: 82.5 fL (ref 78.0–100.0)
Monocytes Absolute: 0.7 10*3/uL (ref 0.1–1.0)
Monocytes Relative: 12 % (ref 3–12)
NEUTROS PCT: 67 % (ref 43–77)
Neutro Abs: 3.9 10*3/uL (ref 1.7–7.7)
PLATELETS: 289 10*3/uL (ref 150–400)
RBC: 2.8 MIL/uL — AB (ref 4.22–5.81)
RDW: 15.6 % — AB (ref 11.5–15.5)
WBC: 5.9 10*3/uL (ref 4.0–10.5)

## 2015-01-26 LAB — I-STAT TROPONIN, ED: TROPONIN I, POC: 0.08 ng/mL (ref 0.00–0.08)

## 2015-01-26 LAB — PROTIME-INR
INR: 1.54 — AB (ref 0.00–1.49)
PROTHROMBIN TIME: 18.5 s — AB (ref 11.6–15.2)

## 2015-01-26 LAB — TROPONIN I: Troponin I: 0.06 ng/mL — ABNORMAL HIGH (ref <0.031)

## 2015-01-26 LAB — BRAIN NATRIURETIC PEPTIDE

## 2015-01-26 MED ORDER — METHOCARBAMOL 500 MG PO TABS
1000.0000 mg | ORAL_TABLET | Freq: Once | ORAL | Status: AC
  Administered 2015-01-26: 1000 mg via ORAL
  Filled 2015-01-26: qty 2

## 2015-01-26 MED ORDER — ONDANSETRON HCL 4 MG PO TABS
4.0000 mg | ORAL_TABLET | Freq: Once | ORAL | Status: AC
  Administered 2015-01-26: 4 mg via ORAL
  Filled 2015-01-26: qty 1

## 2015-01-26 MED ORDER — IBUPROFEN 200 MG PO TABS
600.0000 mg | ORAL_TABLET | ORAL | Status: AC
  Administered 2015-01-26: 600 mg via ORAL
  Filled 2015-01-26: qty 3

## 2015-01-26 MED ORDER — METHOCARBAMOL 500 MG PO TABS: 1000.0000 mg | ORAL_TABLET | Freq: Three times a day (TID) | ORAL | Status: DC | PRN

## 2015-01-26 NOTE — ED Provider Notes (Signed)
CSN: [id number]     Arrival date & time 01/26/15  0343 History   First MD Initiated Contact with Patient 01/26/15 [phone]     Chief Complaint  Patient presents with  . Chest Pain     (Consider location/radiation/quality/duration/timing/severity/associated sxs/prior Treatment) HPI Patient presents with constant left-sided chest, shoulder and neck pain for the past 2 days. The pain is worse with palpation and movement. No known trauma. No shortness of breath or cough. Patient is a dialysis patient on dialysis Tuesday Thursday and Saturday. He is due for dialysis today. States he came to the emergency department because he was having difficulty sleeping. His had no lower extremity swelling or pain. Past Medical History  Diagnosis Date  . Hypertension   . Depression   . Complication of anesthesia     itching, sore throat  . Diabetes mellitus without complication     No history per patient, but remains under history as A1c would not be accurate given on dialysis  . Shortness of breath   . Anxiety   . ESRD (end stage renal disease)     due to HTN per patient, followed at [facility], s/p failed kidney transplant - dialysis Tue, Th, Sat  . Renal insufficiency   . CHF (congestive heart failure)   . Anemia   . Dialysis patient    Past Surgical History  Procedure Laterality Date  . Kidney receipient  2006    failed and started HD in March 2014  . Capd insertion    . Capd removal    . Left heart catheterization with coronary angiogram N/A 09/02/2014    Procedure: LEFT HEART CATHETERIZATION WITH CORONARY ANGIOGRAM;  Surgeon: [name], MD;  Location: [facility] CATH LAB;  Service: Cardiovascular;  Laterality: N/A;   History reviewed. No pertinent family history. History  Substance Use Topics  . Smoking status: Former Smoker -- 1.00 packs/day for 1 years    Types: Cigarettes  . Smokeless tobacco: Never Used     Comment: quit Jan 2014  . Alcohol Use: No    Review of Systems  Constitutional:  Negative for fever and chills.  Respiratory: Negative for cough and shortness of breath.   Cardiovascular: Positive for chest pain. Negative for palpitations and leg swelling.  Gastrointestinal: Negative for nausea, vomiting and abdominal pain.  Genitourinary: Negative for dysuria, frequency and flank pain.  Musculoskeletal: Positive for myalgias, arthralgias and neck pain. Negative for back pain, joint swelling, gait problem and neck stiffness.  Skin: Negative for rash and wound.  Neurological: Negative for dizziness, weakness, light-headedness, numbness and headaches.  All other systems reviewed and are negative.     Allergies  Ferrlecit and Darvocet  Home Medications   Prior to Admission medications   Medication Sig Start Date End Date Taking? Authorizing Provider  atorvastatin (LIPITOR) 40 MG tablet Take 1 tablet (40 mg total) by mouth daily at 6 PM. 09/02/14   Barton Dubois, MD  carvedilol (COREG) 25 MG tablet Take 1 tablet (25 mg total) by mouth 2 (two) times daily with a meal. 10/10/14   [name], DO  cinacalcet (SENSIPAR) 30 MG tablet Take 30 mg by mouth daily.    Historical Provider, MD  diclofenac sodium (VOLTAREN) 1 % GEL Apply 2 g topically 4 (four) times daily. 11/14/14   Tresa Garter, MD  dicyclomine (BENTYL) 20 MG tablet Take 1 tablet (20 mg total) by mouth 2 (two) times daily. 12/20/14   Ernestina Patches, MD  docusate sodium (COLACE)  100 MG capsule Take 1 capsule (100 mg total) by mouth 2 (two) times daily. 11/19/14   [name], MD  doxercalciferol (HECTOROL) 4 MCG/2ML injection Inject 1.25 mLs (2.5 mcg total) into the vein Every Tuesday,Thursday,and Saturday with dialysis. 10/10/14   [name], DO  enoxaparin (LOVENOX) 80 MG/0.8ML injection Inject 0.8 mLs (80 mg total) into the skin daily. Patient not taking: Reported on 12/26/2014 12/16/14   Ezequiel Essex, MD  hydrALAZINE (APRESOLINE) 25 MG tablet Take 1 tablet (25 mg total) by mouth every 8 (eight) hours.  01/03/15   [name], MD  levalbuterol [facility] HFA) 45 MCG/ACT inhaler Inhale 2 puffs into the lungs every 6 (six) hours as needed for wheezing or shortness of breath. 11/19/14   [name], MD  lisinopril (PRINIVIL,ZESTRIL) 20 MG tablet Take 1 tablet (20 mg total) by mouth daily. 12/05/14   Verlee Monte, MD  LORazepam (ATIVAN) 0.5 MG tablet Take 1 tablet (0.5 mg total) by mouth at bedtime as needed for anxiety. 11/19/14   [name], MD  methocarbamol (ROBAXIN) 500 MG tablet Take 2 tablets (1,000 mg total) by mouth every 8 (eight) hours as needed for muscle spasms. 01/26/15   [name], MD  omeprazole (PRILOSEC) 20 MG capsule Take 20 mg by mouth daily. 07/01/13   Historical Provider, MD  ondansetron (ZOFRAN) 4 MG tablet Take 1 tablet (4 mg total) by mouth every 6 (six) hours. 12/20/14   Ernestina Patches, MD  ondansetron (ZOFRAN-ODT) 4 MG disintegrating tablet Take 1 tablet (4 mg total) by mouth every 8 (eight) hours as needed for nausea or vomiting. 12/05/14   Verlee Monte, MD  predniSONE (DELTASONE) 5 MG tablet Take 5 mg by mouth daily with breakfast.    Historical Provider, MD  sodium polystyrene (KAYEXALATE) powder Take by mouth once. Take 30g by mouth one time. Patient not taking: Reported on 01/10/2015 12/31/14   Serita Grit, MD  warfarin (COUMADIN) 4 MG tablet Take 1 tablet (4 mg total) by mouth daily at 6 PM. 12/05/14   Verlee Monte, MD   BP 193/151 mmHg  Pulse 98  Temp(Src) 98 F (36.7 C) (Oral)  Resp 21  SpO2 100% Physical Exam  Constitutional: He is oriented to person, place, and time. He appears well-developed and well-nourished. No distress.  HENT:  Head: Normocephalic and atraumatic.  Mouth/Throat: Oropharynx is clear and moist.  Eyes: EOM are normal. Pupils are equal, round, and reactive to light.  Neck: Normal range of motion. Neck supple.  Cardiovascular: Normal rate and regular rhythm.   Pulmonary/Chest: Effort normal and breath sounds normal. No respiratory  distress. He has no wheezes. He has no rales. He exhibits tenderness.  Abdominal: Soft. Bowel sounds are normal. He exhibits no distension and no mass. There is no tenderness. There is no rebound and no guarding.  Musculoskeletal: Normal range of motion. He exhibits tenderness. He exhibits no edema.  Patient with tenderness to palpation to the left shoulder and pain with range of motion of the left shoulder. No calf swelling or tenderness.  Neurological: He is alert and oriented to person, place, and time.  5/5 motor in all extremities. Sensation is fully intact.  Skin: Skin is warm and dry. No rash noted. No erythema.  Psychiatric: He has a normal mood and affect. His behavior is normal.  Nursing note and vitals reviewed.   ED Course  Procedures (including critical care time) Labs Review Labs Reviewed  CBC WITH DIFFERENTIAL/PLATELET - Abnormal; Notable for  the following:    RBC 2.80 (*)    Hemoglobin 7.2 (*)    HCT 23.1 (*)    MCH 25.7 (*)    RDW 15.6 (*)    All other components within normal limits  COMPREHENSIVE METABOLIC PANEL - Abnormal; Notable for the following:    BUN 53 (*)    Creatinine, Ser 10.67 (*)    Calcium 7.8 (*)    Albumin 2.1 (*)    AST 245 (*)    ALT 108 (*)    Alkaline Phosphatase 132 (*)    GFR calc non Af Amer 5 (*)    GFR calc Af Amer 6 (*)    All other components within normal limits  BRAIN NATRIURETIC PEPTIDE - Abnormal; Notable for the following:    B Natriuretic Peptide >4500.0 (*)    All other components within normal limits  TROPONIN I - Abnormal; Notable for the following:    Troponin I 0.06 (*)    All other components within normal limits  PROTIME-INR - Abnormal; Notable for the following:    Prothrombin Time 18.5 (*)    INR 1.54 (*)    All other components within normal limits  I-STAT TROPOININ, ED    Imaging Review Dg Chest Port 1 View  01/26/2015   CLINICAL DATA:  Central chest pain  EXAM: PORTABLE CHEST - 1 VIEW  COMPARISON:   01/09/2015  FINDINGS: Dialysis catheter from right IJ approach is in stable position, tip at the right atrium. Stable cardiomegaly. Stable aortic and hilar contours.  Baseline appearance of the lungs. There is no edema, consolidation, effusion, or pneumothorax.  No acute osseous findings.  IMPRESSION: Stable exam.  No acute cardiopulmonary disease.   Electronically Signed   By: Monte Fantasia M.D.   On: 01/26/2015 04:30     EKG Interpretation   Date/Time:  Thursday January 26 2015 03:50:14 EDT Ventricular Rate:  102 PR Interval:  158 QRS Duration: 87 QT Interval:  390 QTC Calculation: 508 R Axis:   -6 Text Interpretation:  Sinus tachycardia Probable left atrial enlargement  Left ventricular hypertrophy Nonspecific T abnormalities, lateral leads  Prolonged QT interval Confirmed by Lita Mains  MD, Bobette Leyh ([id number]) on  01/26/2015 3:53:30 AM      MDM   Final diagnoses:  Chest pain  Chest wall pain  Renal failure    Patient's symptoms appear to be muscle skeletal origin. He has mild elevation of his troponin which appears to be at his baseline quite possibly due to his chronic renal failure. Patient does have some T-wave inversions in his lateral and inferior leads. This appears similar to a previous EKG.  Patient with cardiac catheterization earlier this year that showed only minimal CAD.  Repeat troponin without any significant change. Will discharge patient to go to his dialysis clinic. Return precautions given.  [name], MD 01/26/15 [phone]

## 2015-01-26 NOTE — ED Notes (Signed)
Pt given two heat packs for neck and shoulder discomfort. Pt continues to moan in room. Now complains of nausea. Notified physician of patient's complaints.

## 2015-01-26 NOTE — ED Notes (Signed)
Lab to add Protime-INR. Spoke with Freda Munro.

## 2015-01-26 NOTE — Discharge Instructions (Signed)

## 2015-01-26 NOTE — ED Notes (Signed)
C/O  Central chest  Pain with shortness of breath with activity.noted that left chest is sore and tender upon palpation.  Patient reports that this happens a lot when it is his day for dialysis.

## 2015-01-26 NOTE — ED Notes (Signed)
Patient presents with chronic chest pain, noted left sided chest wall pain. Patient is scheduled for dialysis today at 1130

## 2015-01-31 ENCOUNTER — Emergency Department (HOSPITAL_COMMUNITY)
Admission: EM | Admit: 2015-01-31 | Discharge: 2015-01-31 | Disposition: A | Discharge status: Home / Self Care | Payer: Medicare Other | Source: Home / Self Care | Attending: Emergency Medicine | Admitting: Emergency Medicine

## 2015-01-31 ENCOUNTER — Emergency Department (HOSPITAL_COMMUNITY): Payer: Medicare Other

## 2015-01-31 ENCOUNTER — Inpatient Hospital Stay (HOSPITAL_COMMUNITY)
Admission: EM | Admit: 2015-01-31 | Discharge: 2015-02-04 | DRG: 299 | Disposition: A | Discharge status: Home / Self Care | Payer: Medicare Other | Attending: Internal Medicine | Admitting: Internal Medicine

## 2015-01-31 ENCOUNTER — Encounter (HOSPITAL_COMMUNITY): Payer: Self-pay | Admitting: Emergency Medicine

## 2015-01-31 DIAGNOSIS — E1122 Type 2 diabetes mellitus with diabetic chronic kidney disease: Secondary | ICD-10-CM | POA: Diagnosis present

## 2015-01-31 DIAGNOSIS — I82623 Acute embolism and thrombosis of deep veins of upper extremity, bilateral: Principal | ICD-10-CM | POA: Diagnosis present

## 2015-01-31 DIAGNOSIS — Z9119 Patient's noncompliance with other medical treatment and regimen: Secondary | ICD-10-CM | POA: Diagnosis present

## 2015-01-31 DIAGNOSIS — I132 Hypertensive heart and chronic kidney disease with heart failure and with stage 5 chronic kidney disease, or end stage renal disease: Secondary | ICD-10-CM | POA: Diagnosis present

## 2015-01-31 DIAGNOSIS — I120 Hypertensive chronic kidney disease with stage 5 chronic kidney disease or end stage renal disease: Secondary | ICD-10-CM | POA: Insufficient documentation

## 2015-01-31 DIAGNOSIS — F329 Major depressive disorder, single episode, unspecified: Secondary | ICD-10-CM | POA: Diagnosis present

## 2015-01-31 DIAGNOSIS — Z87891 Personal history of nicotine dependence: Secondary | ICD-10-CM

## 2015-01-31 DIAGNOSIS — G8929 Other chronic pain: Secondary | ICD-10-CM | POA: Diagnosis present

## 2015-01-31 DIAGNOSIS — Z992 Dependence on renal dialysis: Secondary | ICD-10-CM | POA: Diagnosis not present

## 2015-01-31 DIAGNOSIS — E1129 Type 2 diabetes mellitus with other diabetic kidney complication: Secondary | ICD-10-CM

## 2015-01-31 DIAGNOSIS — Z7289 Other problems related to lifestyle: Secondary | ICD-10-CM | POA: Diagnosis not present

## 2015-01-31 DIAGNOSIS — I509 Heart failure, unspecified: Secondary | ICD-10-CM

## 2015-01-31 DIAGNOSIS — Z86718 Personal history of other venous thrombosis and embolism: Secondary | ICD-10-CM | POA: Diagnosis not present

## 2015-01-31 DIAGNOSIS — R0789 Other chest pain: Secondary | ICD-10-CM

## 2015-01-31 DIAGNOSIS — Z7952 Long term (current) use of systemic steroids: Secondary | ICD-10-CM

## 2015-01-31 DIAGNOSIS — F419 Anxiety disorder, unspecified: Secondary | ICD-10-CM | POA: Diagnosis present

## 2015-01-31 DIAGNOSIS — E440 Moderate protein-calorie malnutrition: Secondary | ICD-10-CM | POA: Diagnosis present

## 2015-01-31 DIAGNOSIS — N186 End stage renal disease: Secondary | ICD-10-CM

## 2015-01-31 DIAGNOSIS — Z79899 Other long term (current) drug therapy: Secondary | ICD-10-CM | POA: Insufficient documentation

## 2015-01-31 DIAGNOSIS — E8770 Fluid overload, unspecified: Secondary | ICD-10-CM | POA: Diagnosis present

## 2015-01-31 DIAGNOSIS — I10 Essential (primary) hypertension: Secondary | ICD-10-CM | POA: Diagnosis present

## 2015-01-31 DIAGNOSIS — E785 Hyperlipidemia, unspecified: Secondary | ICD-10-CM | POA: Diagnosis present

## 2015-01-31 DIAGNOSIS — R600 Localized edema: Secondary | ICD-10-CM | POA: Insufficient documentation

## 2015-01-31 DIAGNOSIS — Z765 Malingerer [conscious simulation]: Secondary | ICD-10-CM | POA: Diagnosis present

## 2015-01-31 DIAGNOSIS — Z9115 Patient's noncompliance with renal dialysis: Secondary | ICD-10-CM | POA: Diagnosis present

## 2015-01-31 DIAGNOSIS — G479 Sleep disorder, unspecified: Secondary | ICD-10-CM

## 2015-01-31 DIAGNOSIS — M542 Cervicalgia: Secondary | ICD-10-CM | POA: Diagnosis present

## 2015-01-31 DIAGNOSIS — Z86711 Personal history of pulmonary embolism: Secondary | ICD-10-CM

## 2015-01-31 DIAGNOSIS — E875 Hyperkalemia: Secondary | ICD-10-CM

## 2015-01-31 DIAGNOSIS — I5042 Chronic combined systolic (congestive) and diastolic (congestive) heart failure: Secondary | ICD-10-CM | POA: Diagnosis present

## 2015-01-31 DIAGNOSIS — D649 Anemia, unspecified: Secondary | ICD-10-CM | POA: Diagnosis present

## 2015-01-31 DIAGNOSIS — R079 Chest pain, unspecified: Secondary | ICD-10-CM | POA: Diagnosis present

## 2015-01-31 DIAGNOSIS — Z6822 Body mass index (BMI) 22.0-22.9, adult: Secondary | ICD-10-CM

## 2015-01-31 DIAGNOSIS — E119 Type 2 diabetes mellitus without complications: Secondary | ICD-10-CM

## 2015-01-31 DIAGNOSIS — IMO0002 Reserved for concepts with insufficient information to code with codable children: Secondary | ICD-10-CM | POA: Diagnosis present

## 2015-01-31 DIAGNOSIS — R0602 Shortness of breath: Secondary | ICD-10-CM

## 2015-01-31 DIAGNOSIS — Z940 Kidney transplant status: Secondary | ICD-10-CM | POA: Diagnosis not present

## 2015-01-31 DIAGNOSIS — Z7901 Long term (current) use of anticoagulants: Secondary | ICD-10-CM

## 2015-01-31 DIAGNOSIS — E1165 Type 2 diabetes mellitus with hyperglycemia: Secondary | ICD-10-CM | POA: Diagnosis present

## 2015-01-31 DIAGNOSIS — Z91199 Patient's noncompliance with other medical treatment and regimen due to unspecified reason: Secondary | ICD-10-CM

## 2015-01-31 DIAGNOSIS — I82622 Acute embolism and thrombosis of deep veins of left upper extremity: Secondary | ICD-10-CM | POA: Diagnosis not present

## 2015-01-31 DIAGNOSIS — Z9889 Other specified postprocedural states: Secondary | ICD-10-CM | POA: Insufficient documentation

## 2015-01-31 DIAGNOSIS — Z862 Personal history of diseases of the blood and blood-forming organs and certain disorders involving the immune mechanism: Secondary | ICD-10-CM

## 2015-01-31 DIAGNOSIS — F1910 Other psychoactive substance abuse, uncomplicated: Secondary | ICD-10-CM | POA: Diagnosis not present

## 2015-01-31 LAB — GLUCOSE, CAPILLARY
GLUCOSE-CAPILLARY: 128 mg/dL — AB (ref 65–99)
Glucose-Capillary: 91 mg/dL (ref 65–99)

## 2015-01-31 LAB — I-STAT CHEM 8, ED
BUN: 64 mg/dL — ABNORMAL HIGH (ref 6–20)
Calcium, Ion: 1.08 mmol/L — ABNORMAL LOW (ref 1.12–1.23)
Chloride: 104 mmol/L (ref 101–111)
Creatinine, Ser: 12.1 mg/dL — ABNORMAL HIGH (ref 0.61–1.24)
GLUCOSE: 64 mg/dL — AB (ref 65–99)
HEMATOCRIT: 31 % — AB (ref 39.0–52.0)
HEMOGLOBIN: 10.5 g/dL — AB (ref 13.0–17.0)
Potassium: 6.5 mmol/L (ref 3.5–5.1)
SODIUM: 135 mmol/L (ref 135–145)
TCO2: 20 mmol/L (ref 0–100)

## 2015-01-31 LAB — CBC
HCT: 28.9 % — ABNORMAL LOW (ref 39.0–52.0)
HCT: 28.6 % — ABNORMAL LOW (ref 39.0–52.0)
Hemoglobin: 9.2 g/dL — ABNORMAL LOW (ref 13.0–17.0)
Hemoglobin: 9.3 g/dL — ABNORMAL LOW (ref 13.0–17.0)
MCH: 26.6 pg (ref 26.0–34.0)
MCH: 27.3 pg (ref 26.0–34.0)
MCHC: 31.8 g/dL (ref 30.0–36.0)
MCHC: 32.5 g/dL (ref 30.0–36.0)
MCV: 83.5 fL (ref 78.0–100.0)
MCV: 83.9 fL (ref 78.0–100.0)
PLATELETS: 264 10*3/uL (ref 150–400)
PLATELETS: 299 10*3/uL (ref 150–400)
RBC: 3.46 MIL/uL — ABNORMAL LOW (ref 4.22–5.81)
RBC: 3.41 MIL/uL — ABNORMAL LOW (ref 4.22–5.81)
RDW: 17.2 % — AB (ref 11.5–15.5)
RDW: 17.2 % — ABNORMAL HIGH (ref 11.5–15.5)
WBC: 7.4 10*3/uL (ref 4.0–10.5)
WBC: 5.8 10*3/uL (ref 4.0–10.5)

## 2015-01-31 LAB — HEPATIC FUNCTION PANEL
ALT: 72 U/L — ABNORMAL HIGH (ref 17–63)
AST: 137 U/L — ABNORMAL HIGH (ref 15–41)
Albumin: 2.3 g/dL — ABNORMAL LOW (ref 3.5–5.0)
Alkaline Phosphatase: 125 U/L (ref 38–126)
BILIRUBIN TOTAL: 0.4 mg/dL (ref 0.3–1.2)
Bilirubin, Direct: <0.1 mg/dL — ABNORMAL LOW (ref 0.1–0.5)
TOTAL PROTEIN: 7.6 g/dL (ref 6.5–8.1)

## 2015-01-31 LAB — BASIC METABOLIC PANEL
Anion gap: 13 (ref 5–15)
Anion gap: 14 (ref 5–15)
BUN: 55 mg/dL — AB (ref 6–20)
BUN: 66 mg/dL — AB (ref 6–20)
CHLORIDE: 101 mmol/L (ref 101–111)
CO2: 21 mmol/L — ABNORMAL LOW (ref 22–32)
CO2: 22 mmol/L (ref 22–32)
CREATININE: 10.65 mg/dL — AB (ref 0.61–1.24)
Calcium: 8.5 mg/dL — ABNORMAL LOW (ref 8.9–10.3)
Calcium: 8.3 mg/dL — ABNORMAL LOW (ref 8.9–10.3)
Chloride: 102 mmol/L (ref 101–111)
Creatinine, Ser: 12.07 mg/dL — ABNORMAL HIGH (ref 0.61–1.24)
GFR calc Af Amer: 6 mL/min — ABNORMAL LOW (ref >60)
GFR calc Af Amer: 5 mL/min — ABNORMAL LOW (ref >60)
GFR calc non Af Amer: 4 mL/min — ABNORMAL LOW (ref >60)
GFR, EST NON AFRICAN AMERICAN: 5 mL/min — AB (ref >60)
GLUCOSE: 67 mg/dL (ref 65–99)
GLUCOSE: 94 mg/dL (ref 65–99)
POTASSIUM: 6.7 mmol/L — AB (ref 3.5–5.1)
Potassium: 5.1 mmol/L (ref 3.5–5.1)
Sodium: 137 mmol/L (ref 135–145)
Sodium: 136 mmol/L (ref 135–145)

## 2015-01-31 LAB — LIPASE, BLOOD: Lipase: 45 U/L (ref 22–51)

## 2015-01-31 LAB — PROTIME-INR
INR: 1.25 (ref 0.00–1.49)
Prothrombin Time: 15.9 seconds — ABNORMAL HIGH (ref 11.6–15.2)

## 2015-01-31 LAB — HEPATITIS B SURFACE ANTIGEN: HEP B S AG: NEGATIVE

## 2015-01-31 LAB — I-STAT TROPONIN, ED: TROPONIN I, POC: 0.08 ng/mL (ref 0.00–0.08)

## 2015-01-31 LAB — TSH: TSH: 2.213 u[IU]/mL (ref 0.350–4.500)

## 2015-01-31 MED ORDER — MORPHINE SULFATE 4 MG/ML IJ SOLN
4.0000 mg | Freq: Once | INTRAMUSCULAR | Status: AC
  Administered 2015-01-31: 4 mg via INTRAVENOUS
  Filled 2015-01-31: qty 1

## 2015-01-31 MED ORDER — HEPARIN SODIUM (PORCINE) 1000 UNIT/ML DIALYSIS: 1000.0000 [IU] | INTRAMUSCULAR | Status: DC | PRN

## 2015-01-31 MED ORDER — HYDRALAZINE HCL 25 MG PO TABS
25.0000 mg | ORAL_TABLET | Freq: Three times a day (TID) | ORAL | Status: DC
  Administered 2015-01-31 – 2015-02-02 (×4): 25 mg via ORAL
  Filled 2015-01-31 (×8): qty 1

## 2015-01-31 MED ORDER — ACETAMINOPHEN 650 MG RE SUPP: 650.0000 mg | Freq: Four times a day (QID) | RECTAL | Status: DC | PRN

## 2015-01-31 MED ORDER — LEVALBUTEROL TARTRATE 45 MCG/ACT IN AERO
2.0000 | INHALATION_SPRAY | Freq: Four times a day (QID) | RESPIRATORY_TRACT | Status: DC | PRN
  Filled 2015-01-31: qty 15

## 2015-01-31 MED ORDER — WARFARIN - PHARMACIST DOSING INPATIENT
Freq: Every day | Status: DC
  Administered 2015-02-01 – 2015-02-02 (×2)

## 2015-01-31 MED ORDER — METHOCARBAMOL 500 MG PO TABS
1000.0000 mg | ORAL_TABLET | Freq: Three times a day (TID) | ORAL | Status: DC | PRN
  Filled 2015-01-31 (×2): qty 2

## 2015-01-31 MED ORDER — ALBUTEROL SULFATE (2.5 MG/3ML) 0.083% IN NEBU
2.5000 mg | INHALATION_SOLUTION | Freq: Four times a day (QID) | RESPIRATORY_TRACT | Status: DC | PRN
  Administered 2015-02-02: 2.5 mg via RESPIRATORY_TRACT
  Filled 2015-01-31: qty 3

## 2015-01-31 MED ORDER — WARFARIN SODIUM 6 MG PO TABS
6.0000 mg | ORAL_TABLET | Freq: Once | ORAL | Status: AC
  Administered 2015-01-31: 6 mg via ORAL
  Filled 2015-01-31: qty 1

## 2015-01-31 MED ORDER — ADULT MULTIVITAMIN W/MINERALS CH
1.0000 | ORAL_TABLET | Freq: Every day | ORAL | Status: DC
  Administered 2015-02-01 – 2015-02-02 (×2): 1 via ORAL
  Filled 2015-01-31 (×3): qty 1

## 2015-01-31 MED ORDER — PANTOPRAZOLE SODIUM 40 MG PO TBEC
40.0000 mg | DELAYED_RELEASE_TABLET | Freq: Every day | ORAL | Status: DC
  Administered 2015-01-31 – 2015-02-04 (×5): 40 mg via ORAL
  Filled 2015-01-31 (×4): qty 1

## 2015-01-31 MED ORDER — DICLOFENAC SODIUM 1 % TD GEL
2.0000 g | Freq: Four times a day (QID) | TRANSDERMAL | Status: DC
  Administered 2015-02-01 – 2015-02-04 (×7): 2 g via TOPICAL
  Filled 2015-01-31 (×2): qty 100

## 2015-01-31 MED ORDER — HEPARIN SODIUM (PORCINE) 1000 UNIT/ML DIALYSIS: 20.0000 [IU]/kg | INTRAMUSCULAR | Status: DC | PRN

## 2015-01-31 MED ORDER — CARVEDILOL 25 MG PO TABS
25.0000 mg | ORAL_TABLET | Freq: Two times a day (BID) | ORAL | Status: DC
  Administered 2015-02-01 – 2015-02-04 (×7): 25 mg via ORAL
  Filled 2015-01-31 (×10): qty 1

## 2015-01-31 MED ORDER — DICYCLOMINE HCL 20 MG PO TABS
20.0000 mg | ORAL_TABLET | Freq: Two times a day (BID) | ORAL | Status: DC
  Administered 2015-01-31 – 2015-02-04 (×8): 20 mg via ORAL
  Filled 2015-01-31 (×11): qty 1

## 2015-01-31 MED ORDER — OXYCODONE HCL 5 MG PO TABS
5.0000 mg | ORAL_TABLET | ORAL | Status: DC | PRN
  Administered 2015-01-31 – 2015-02-01 (×2): 5 mg via ORAL
  Filled 2015-01-31 (×2): qty 1

## 2015-01-31 MED ORDER — SODIUM CHLORIDE 0.9 % IJ SOLN: 3.0000 mL | Freq: Two times a day (BID) | INTRAMUSCULAR | Status: DC

## 2015-01-31 MED ORDER — DOCUSATE SODIUM 100 MG PO CAPS
100.0000 mg | ORAL_CAPSULE | Freq: Two times a day (BID) | ORAL | Status: DC
  Administered 2015-01-31 – 2015-02-04 (×8): 100 mg via ORAL
  Filled 2015-01-31 (×12): qty 1

## 2015-01-31 MED ORDER — SODIUM CHLORIDE 0.9 % IV SOLN: 250.0000 mL | INTRAVENOUS | Status: DC | PRN

## 2015-01-31 MED ORDER — ONDANSETRON 4 MG PO TBDP
4.0000 mg | ORAL_TABLET | Freq: Once | ORAL | Status: AC
  Administered 2015-01-31: 4 mg via ORAL
  Filled 2015-01-31: qty 1

## 2015-01-31 MED ORDER — VITAMIN B-1 100 MG PO TABS
100.0000 mg | ORAL_TABLET | Freq: Every day | ORAL | Status: DC
  Administered 2015-01-31 – 2015-02-04 (×5): 100 mg via ORAL
  Filled 2015-01-31 (×6): qty 1

## 2015-01-31 MED ORDER — SODIUM CHLORIDE 0.9 % IV SOLN: 100.0000 mL | INTRAVENOUS | Status: DC | PRN

## 2015-01-31 MED ORDER — SODIUM CHLORIDE 0.9 % IJ SOLN
3.0000 mL | Freq: Two times a day (BID) | INTRAMUSCULAR | Status: DC
  Administered 2015-02-02 (×2): 3 mL via INTRAVENOUS

## 2015-01-31 MED ORDER — ATORVASTATIN CALCIUM 40 MG PO TABS
40.0000 mg | ORAL_TABLET | Freq: Every day | ORAL | Status: DC
  Administered 2015-02-01 – 2015-02-03 (×3): 40 mg via ORAL
  Filled 2015-01-31 (×5): qty 1

## 2015-01-31 MED ORDER — OXYCODONE-ACETAMINOPHEN 5-325 MG PO TABS
1.0000 | ORAL_TABLET | Freq: Once | ORAL | Status: AC
  Administered 2015-01-31: 1 via ORAL
  Filled 2015-01-31: qty 1

## 2015-01-31 MED ORDER — DOXERCALCIFEROL 4 MCG/2ML IV SOLN
2.5000 ug | INTRAVENOUS | Status: DC
  Administered 2015-02-04: 2.5 ug via INTRAVENOUS
  Filled 2015-01-31 (×2): qty 2

## 2015-01-31 MED ORDER — CINACALCET HCL 30 MG PO TABS
30.0000 mg | ORAL_TABLET | Freq: Every day | ORAL | Status: DC
  Administered 2015-02-01 – 2015-02-04 (×4): 30 mg via ORAL
  Filled 2015-01-31 (×6): qty 1

## 2015-01-31 MED ORDER — ONDANSETRON HCL 4 MG/2ML IJ SOLN: 4.0000 mg | Freq: Four times a day (QID) | INTRAMUSCULAR | Status: DC | PRN

## 2015-01-31 MED ORDER — NEPRO/CARBSTEADY PO LIQD: 237.0000 mL | ORAL | Status: DC | PRN

## 2015-01-31 MED ORDER — PENTAFLUOROPROP-TETRAFLUOROETH EX AERO
1.0000 | INHALATION_SPRAY | CUTANEOUS | Status: DC | PRN
Start: 2015-01-31 — End: 2015-02-01

## 2015-01-31 MED ORDER — ALTEPLASE 2 MG IJ SOLR: 2.0000 mg | Freq: Once | INTRAMUSCULAR | Status: AC | PRN

## 2015-01-31 MED ORDER — LISINOPRIL 20 MG PO TABS
20.0000 mg | ORAL_TABLET | Freq: Every day | ORAL | Status: DC
  Administered 2015-01-31 – 2015-02-04 (×4): 20 mg via ORAL
  Filled 2015-01-31 (×7): qty 1

## 2015-01-31 MED ORDER — WARFARIN SODIUM 4 MG PO TABS: 4.0000 mg | ORAL_TABLET | Freq: Every day | ORAL | Status: DC

## 2015-01-31 MED ORDER — SODIUM CHLORIDE 0.9 % IJ SOLN: 3.0000 mL | INTRAMUSCULAR | Status: DC | PRN

## 2015-01-31 MED ORDER — ACETAMINOPHEN 325 MG PO TABS: 650.0000 mg | ORAL_TABLET | Freq: Four times a day (QID) | ORAL | Status: DC | PRN

## 2015-01-31 MED ORDER — IOHEXOL 350 MG/ML SOLN
100.0000 mL | Freq: Once | INTRAVENOUS | Status: AC | PRN
  Administered 2015-01-31: 100 mL via INTRAVENOUS

## 2015-01-31 MED ORDER — LIDOCAINE HCL (PF) 1 % IJ SOLN: 5.0000 mL | INTRAMUSCULAR | Status: DC | PRN

## 2015-01-31 MED ORDER — POLYETHYLENE GLYCOL 3350 17 G PO PACK: 17.0000 g | PACK | Freq: Every day | ORAL | Status: DC | PRN

## 2015-01-31 MED ORDER — INSULIN ASPART 100 UNIT/ML ~~LOC~~ SOLN: 0.0000 [IU] | SUBCUTANEOUS | Status: DC

## 2015-01-31 MED ORDER — FOLIC ACID 1 MG PO TABS
1.0000 mg | ORAL_TABLET | Freq: Every day | ORAL | Status: DC
  Administered 2015-01-31 – 2015-02-04 (×5): 1 mg via ORAL
  Filled 2015-01-31 (×7): qty 1

## 2015-01-31 MED ORDER — LIDOCAINE-PRILOCAINE 2.5-2.5 % EX CREA: 1.0000 "application " | TOPICAL_CREAM | CUTANEOUS | Status: DC | PRN

## 2015-01-31 MED ORDER — ONDANSETRON HCL 4 MG/2ML IJ SOLN
4.0000 mg | Freq: Once | INTRAMUSCULAR | Status: AC
  Administered 2015-01-31: 4 mg via INTRAVENOUS
  Filled 2015-01-31: qty 2

## 2015-01-31 MED ORDER — LORAZEPAM 0.5 MG PO TABS
0.5000 mg | ORAL_TABLET | Freq: Every evening | ORAL | Status: DC | PRN
  Administered 2015-01-31 – 2015-02-04 (×3): 0.5 mg via ORAL
  Filled 2015-01-31 (×3): qty 1

## 2015-01-31 MED ORDER — ONDANSETRON HCL 4 MG PO TABS
4.0000 mg | ORAL_TABLET | Freq: Four times a day (QID) | ORAL | Status: DC | PRN
  Administered 2015-02-02: 4 mg via ORAL
  Filled 2015-01-31: qty 1

## 2015-01-31 MED ORDER — PREDNISONE 5 MG PO TABS
5.0000 mg | ORAL_TABLET | Freq: Every day | ORAL | Status: DC
  Administered 2015-02-01 – 2015-02-04 (×4): 5 mg via ORAL
  Filled 2015-01-31 (×6): qty 1

## 2015-01-31 NOTE — ED Provider Notes (Signed)
1745: Contacted by Dr. Melvia Heaps with nephrology team that patient will need admission after he completes dialysis.  Is currently up in HD #5.   PCP is Dr. Doreene Burke with [facility], so will call to hospitalist for admission and discuss.  The patient's previous ED providers are no longer present as he has been out of the department for some time up in HD.  Admitted to Dr. Humphrey Rolls with hospitalist team.      Patient was discussed with ED Attending, Dr. Maryjean Ka, MD      Tori Milks, MD 06/06/51 0802  Delora Fuel, MD 23/36/12 [id number]

## 2015-01-31 NOTE — ED Notes (Signed)
Pt. Given a [place] sandwich and ginger ale

## 2015-01-31 NOTE — ED Notes (Signed)
Pt. Stated, I've been here since yesterday trying to get dialysis and my chest is hurting, I've not eaten and feel terrible.

## 2015-01-31 NOTE — ED Notes (Signed)
Dr. Vanita Panda advised patient can go to dialysis and then be discharged home

## 2015-01-31 NOTE — ED Notes (Signed)
Pt. Refused an EKG and wants to see charge, and wants dialysis . [place], charge talking to Pt.

## 2015-01-31 NOTE — Procedures (Signed)
I was present at this session.  I have reviewed the session itself and made appropriate changes.  HD to lower vol and K . bp ^, follow as does HD.    Frank [name] L 7/12/20168:01 PM

## 2015-01-31 NOTE — Consult Note (Signed)
Renal Service Consult Note [facility]  Frank [name] 01/31/2015 [place] Requesting Physician:  ED physicians at [facility]  Reason for Consult:  ESRD pt with high K, chest pain/ SOB HPI: The patient is a 49 y.o. year-old with hx of ESRD, failed renal tx, depression, HTN , anemia presented to ED with SOB and chest pain late last night around [place].  CXR was neg and CT chest showed no PE. CT showed reflux of contrast into IVC suggesting RHF. Asked to see for high K / dialysis.   Patient had renal tx 8 yrs, back on HD 3 years. Did PD prior to Tx and was trying to get back on PD this year but it has not worked out and he is currently getting TTS HD in [place] with [facility].  PD cath is still in . He came here last night with SSCP radiating to the shoulders, mild SOB, no cough or sputum.  No abd pain, n/v/d no joint pain. +more leg swellilng than usual. Last HD was 3d ago on Sat.   Past Medical History  Past Medical History  Diagnosis Date  . Hypertension   . Depression   . Complication of anesthesia     itching, sore throat  . Diabetes mellitus without complication     No history per patient, but remains under history as A1c would not be accurate given on dialysis  . Shortness of breath   . Anxiety   . ESRD (end stage renal disease)     due to HTN per patient, followed at [facility], s/p failed kidney transplant - dialysis Tue, Th, Sat  . Renal insufficiency   . CHF (congestive heart failure)   . Anemia   . Dialysis patient    Past Surgical History  Past Surgical History  Procedure Laterality Date  . Kidney receipient  2006    failed and started HD in March 2014  . Capd insertion    . Capd removal    . Left heart catheterization with coronary angiogram N/A 09/02/2014    Procedure: LEFT HEART CATHETERIZATION WITH CORONARY ANGIOGRAM;  Surgeon: [name], MD;  Location: [facility] CATH LAB;  Service: Cardiovascular;  Laterality: N/A;   Family History No  family history on file. Social History  reports that he has quit smoking. His smoking use included Cigarettes. He has a 1 pack-year smoking history. He has never used smokeless tobacco. He reports that he does not drink alcohol or use illicit drugs. Allergies  Allergies  Allergen Reactions  . Ferrlecit [Na Ferric Gluc Cplx In Sucrose] Shortness Of Breath, Swelling and Other (See Comments)    Swelling in throat  . Darvocet [Propoxyphene N-Acetaminophen] Hives   Home medications Prior to Admission medications   Medication Sig Start Date End Date Taking? Authorizing Provider  atorvastatin (LIPITOR) 40 MG tablet Take 1 tablet (40 mg total) by mouth daily at 6 PM. 09/02/14   Barton Dubois, MD  carvedilol (COREG) 25 MG tablet Take 1 tablet (25 mg total) by mouth 2 (two) times daily with a meal. 10/10/14   [name], DO  cinacalcet (SENSIPAR) 30 MG tablet Take 30 mg by mouth daily.    Historical Provider, MD  diclofenac sodium (VOLTAREN) 1 % GEL Apply 2 g topically 4 (four) times daily. 11/14/14   Tresa Garter, MD  dicyclomine (BENTYL) 20 MG tablet Take 1 tablet (20 mg total) by mouth 2 (two) times daily. 12/20/14   Ernestina Patches, MD  docusate sodium (COLACE) 100 MG capsule Take 1 capsule (100 mg total) by mouth 2 (two) times daily. 11/19/14   [name], MD  doxercalciferol (HECTOROL) 4 MCG/2ML injection Inject 1.25 mLs (2.5 mcg total) into the vein Every Tuesday,Thursday,and Saturday with dialysis. 10/10/14   [name], DO  hydrALAZINE (APRESOLINE) 25 MG tablet Take 1 tablet (25 mg total) by mouth every 8 (eight) hours. 01/03/15   [name], MD  levalbuterol [facility] HFA) 45 MCG/ACT inhaler Inhale 2 puffs into the lungs every 6 (six) hours as needed for wheezing or shortness of breath. 11/19/14   [name], MD  lisinopril (PRINIVIL,ZESTRIL) 20 MG tablet Take 1 tablet (20 mg total) by mouth daily. 12/05/14   Verlee Monte, MD  LORazepam (ATIVAN) 0.5 MG tablet Take 1 tablet (0.5 mg  total) by mouth at bedtime as needed for anxiety. 11/19/14   [name], MD  methocarbamol (ROBAXIN) 500 MG tablet Take 2 tablets (1,000 mg total) by mouth every 8 (eight) hours as needed for muscle spasms. 01/26/15   [name], MD  omeprazole (PRILOSEC) 20 MG capsule Take 20 mg by mouth daily. 07/01/13   Historical Provider, MD  ondansetron (ZOFRAN) 4 MG tablet Take 1 tablet (4 mg total) by mouth every 6 (six) hours. 12/20/14   Ernestina Patches, MD  ondansetron (ZOFRAN-ODT) 4 MG disintegrating tablet Take 1 tablet (4 mg total) by mouth every 8 (eight) hours as needed for nausea or vomiting. 12/05/14   Verlee Monte, MD  predniSONE (DELTASONE) 5 MG tablet Take 5 mg by mouth daily with breakfast.    Historical Provider, MD  warfarin (COUMADIN) 4 MG tablet Take 1 tablet (4 mg total) by mouth daily at 6 PM. 12/05/14   Verlee Monte, MD   Liver Function Tests  Recent Labs Lab 01/26/15 0421 01/31/15 0150  AST 245* 137*  ALT 108* 72*  ALKPHOS 132* 125  BILITOT 0.4 0.4  PROT 7.0 7.6  ALBUMIN 2.1* 2.3*    Recent Labs Lab 01/31/15 0150  LIPASE 45   CBC  Recent Labs Lab 01/26/15 0421 01/31/15 0046 01/31/15 1433 01/31/15 1444  WBC 5.9 7.4 5.8  --   NEUTROABS 3.9  --   --   --   HGB 7.2* 9.2* 9.3* 10.5*  HCT 23.1* 28.9* 28.6* 31.0*  MCV 82.5 83.5 83.9  --   PLT 289 299 264  --    Basic Metabolic Panel  Recent Labs Lab 01/26/15 0421 01/31/15 0046 01/31/15 1433 01/31/15 1444  NA 136 137 136 135  K 4.7 5.1 6.7* 6.5*  CL 101 102 101 104  CO2 25 22 21*  --   GLUCOSE 94 94 67 64*  BUN 53* 55* 66* 64*  CREATININE 10.67* 10.65* 12.07* 12.10*  CALCIUM 7.8* 8.5* 8.3*  --     Filed Vitals:   01/31/15 1650 01/31/15 1652 01/31/15 1700 01/31/15 1730  BP: 191/146 194/135 191/149 181/136  Pulse: 84 82 90 87  Temp: 98.5 F (36.9 C)     TempSrc: Oral     Resp: _0 Height:      Weight: 81.8 kg (180 lb 5.4 oz)     SpO2: 96% 98%     Exam Alert, no distress, a little  dyspneic, mild uremic fetor No rash, cyanosis or gangrene Sclera anicteric, throat clear +JVD Chest basilar coarse rales bilat RRR soft SEM no RG Abd soft, ntnd no ascites or HSM GU normal 2-3+ pitting pretib  edema bilat, no wounds or ulcers Neuro is alert, nf, Ox 3, no asterixis  TTS [place] [facility] on [address] 4h   170 lbs  2/2.5 bath    Assessment: 1. Dyspnea/ chest pain - CXR negative, w/u per primary 2. SOB/ edema LE's / vol excess - address w HD 3. ESRD on HD / failed renal tx lasted 8 yrs, 3 yrs back on HD 4. PD cath - was trying to get back on PD this year but has not worked out 5. DM2 6. Anemia Hb 9.3, got one unit this past weekend at OP HD on Sat 7. HTN uncontrolled on coreg/ prinivil/ hydral at home, vol ^^ 8. Hx DVT R upper chest this year - on coumadin   Plan- HD today, get K and volume/ solute down  Kelly Splinter MD (pgr) [phone]    [phone] 01/31/2015, 5:50 PM

## 2015-01-31 NOTE — H&P (Signed)
[facility] History and Physical  Frank [name] GXQ:[id number] DOB: May 12, 1966 DOA: 01/31/2015  Referring physician: Carmin Muskrat, MD PCP: Angelica Chessman, MD   Chief Complaint: Volume Overload  HPI: Frank [name] is a 49 y.o. male with history of ESRD on HD DM HTN CHF presented to the ED with chest pain. Patient states that the chest pain started on Sunday and woke him out of his sleep. He had associated shortness of breath. He states pain was non-radiating and was in the central chest area. In the ED he had a CT scan done which shows no PE but signs of right heart failure. In addition he was noted to have hyperkalemia requiring urgent dialysis. Patient was also felt to be volume overloaded on admission. He states that his legs were more swollen than usual on arrival. It should be noted that the patient is a poor historian and not really able to provide a good history   Review of Systems:  Systems reviewed and limited as patient is a poor historian  Past Medical History  Diagnosis Date  . Hypertension   . Depression   . Complication of anesthesia     itching, sore throat  . Diabetes mellitus without complication     No history per patient, but remains under history as A1c would not be accurate given on dialysis  . Shortness of breath   . Anxiety   . ESRD (end stage renal disease)     due to HTN per patient, followed at [facility], s/p failed kidney transplant - dialysis Tue, Th, Sat  . Renal insufficiency   . CHF (congestive heart failure)   . Anemia   . Dialysis patient    Past Surgical History  Procedure Laterality Date  . Kidney receipient  2006    failed and started HD in March 2014  . Capd insertion    . Capd removal    . Left heart catheterization with coronary angiogram N/A 09/02/2014    Procedure: LEFT HEART CATHETERIZATION WITH CORONARY ANGIOGRAM;  Surgeon: [name], MD;  Location: [facility] CATH LAB;  Service: Cardiovascular;  Laterality: N/A;   Social  History:  reports that he has quit smoking. His smoking use included Cigarettes. He has a 1 pack-year smoking history. He has never used smokeless tobacco. He reports that he does not drink alcohol or use illicit drugs.  Allergies  Allergen Reactions  . Ferrlecit [Na Ferric Gluc Cplx In Sucrose] Shortness Of Breath, Swelling and Other (See Comments)    Swelling in throat  . Darvocet [Propoxyphene N-Acetaminophen] Hives    No family history on file.   Prior to Admission medications   Medication Sig Start Date End Date Taking? Authorizing Provider  atorvastatin (LIPITOR) 40 MG tablet Take 1 tablet (40 mg total) by mouth daily at 6 PM. 09/02/14   Barton Dubois, MD  carvedilol (COREG) 25 MG tablet Take 1 tablet (25 mg total) by mouth 2 (two) times daily with a meal. 10/10/14   [name], DO  cinacalcet (SENSIPAR) 30 MG tablet Take 30 mg by mouth daily.    Historical Provider, MD  diclofenac sodium (VOLTAREN) 1 % GEL Apply 2 g topically 4 (four) times daily. 11/14/14   Tresa Garter, MD  dicyclomine (BENTYL) 20 MG tablet Take 1 tablet (20 mg total) by mouth 2 (two) times daily. 12/20/14   Ernestina Patches, MD  docusate sodium (COLACE) 100 MG capsule Take 1 capsule (100 mg total) by mouth 2 (two) times  daily. 11/19/14   [name], MD  doxercalciferol (HECTOROL) 4 MCG/2ML injection Inject 1.25 mLs (2.5 mcg total) into the vein Every Tuesday,Thursday,and Saturday with dialysis. 10/10/14   [name], DO  hydrALAZINE (APRESOLINE) 25 MG tablet Take 1 tablet (25 mg total) by mouth every 8 (eight) hours. 01/03/15   [name], MD  levalbuterol [facility] HFA) 45 MCG/ACT inhaler Inhale 2 puffs into the lungs every 6 (six) hours as needed for wheezing or shortness of breath. 11/19/14   [name], MD  lisinopril (PRINIVIL,ZESTRIL) 20 MG tablet Take 1 tablet (20 mg total) by mouth daily. 12/05/14   Verlee Monte, MD  LORazepam (ATIVAN) 0.5 MG tablet Take 1 tablet (0.5 mg total) by mouth at  bedtime as needed for anxiety. 11/19/14   [name], MD  methocarbamol (ROBAXIN) 500 MG tablet Take 2 tablets (1,000 mg total) by mouth every 8 (eight) hours as needed for muscle spasms. 01/26/15   [name], MD  omeprazole (PRILOSEC) 20 MG capsule Take 20 mg by mouth daily. 07/01/13   Historical Provider, MD  ondansetron (ZOFRAN) 4 MG tablet Take 1 tablet (4 mg total) by mouth every 6 (six) hours. 12/20/14   Ernestina Patches, MD  ondansetron (ZOFRAN-ODT) 4 MG disintegrating tablet Take 1 tablet (4 mg total) by mouth every 8 (eight) hours as needed for nausea or vomiting. 12/05/14   Verlee Monte, MD  predniSONE (DELTASONE) 5 MG tablet Take 5 mg by mouth daily with breakfast.    Historical Provider, MD  warfarin (COUMADIN) 4 MG tablet Take 1 tablet (4 mg total) by mouth daily at 6 PM. 12/05/14   Verlee Monte, MD   Physical Exam: Filed Vitals:   01/31/15 1650 01/31/15 1652 01/31/15 1700 01/31/15 1730  BP: 191/146 194/135 191/149 181/136  Pulse: 84 82 90 87  Temp: 98.5 F (36.9 C)     TempSrc: Oral     Resp: _0 Height:      Weight: 81.8 kg (180 lb 5.4 oz)     SpO2: 96% 98%      Wt Readings from Last 3 Encounters:  01/31/15 81.8 kg (180 lb 5.4 oz)  01/31/15 81.676 kg (180 lb 1 oz)  01/03/15 80.9 kg (178 lb 5.6 oz)    General:  Appears sleepy Eyes: PERRL, normal lids ENT: grossly normal hearing Neck: no LAD, masses or thyromegaly Cardiovascular: RRR, no m/r/g. No LE edema. Respiratory: CTA bilaterally, no w/r/r. Abdomen: soft, ntnd Skin: no rash or induration seen Musculoskeletal: grossly normal tone BUE/BLE Psychiatric: patient is sleepy flat affect Neurologic: grossly non-focal          Labs on Admission:  Basic Metabolic Panel:  Recent Labs Lab 01/26/15 0421 01/31/15 0046 01/31/15 1433 01/31/15 1444  NA 136 137 136 135  K 4.7 5.1 6.7* 6.5*  CL 101 102 101 104  CO2 25 22 21*  --   GLUCOSE 94 94 67 64*  BUN 53* 55* 66* 64*  CREATININE 10.67* 10.65*  12.07* 12.10*  CALCIUM 7.8* 8.5* 8.3*  --    Liver Function Tests:  Recent Labs Lab 01/26/15 0421 01/31/15 0150  AST 245* 137*  ALT 108* 72*  ALKPHOS 132* 125  BILITOT 0.4 0.4  PROT 7.0 7.6  ALBUMIN 2.1* 2.3*    Recent Labs Lab 01/31/15 0150  LIPASE 45   No results for input(s): AMMONIA in the last 168 hours. CBC:  Recent Labs Lab 01/26/15 0421 01/31/15 0046 01/31/15 1433  01/31/15 1444  WBC 5.9 7.4 5.8  --   NEUTROABS 3.9  --   --   --   HGB 7.2* 9.2* 9.3* 10.5*  HCT 23.1* 28.9* 28.6* 31.0*  MCV 82.5 83.5 83.9  --   PLT 289 299 264  --    Cardiac Enzymes:  Recent Labs Lab 01/26/15 0421  TROPONINI 0.06*    BNP (last 3 results)  Recent Labs  11/26/14 2352 01/09/15 1529 01/26/15 0421  BNP >4500.0* 3606.1* >4500.0*    ProBNP (last 3 results)  Recent Labs  04/15/14 0809  PROBNP >70000.0*    CBG: No results for input(s): GLUCAP in the last 168 hours.  Radiological Exams on Admission: Dg Chest 2 View  01/31/2015   CLINICAL DATA:  Shortness of breath. Left-sided chest pain. Dialysis patient. History of blood clots.  EXAM: CHEST  2 VIEW  COMPARISON:  01/26/2015  FINDINGS: Mild cardiac enlargement without significant vascular congestion. Mediastinal contours appear intact. No focal airspace disease or consolidation in the lungs. No blunting of costophrenic angles. No pneumothorax. Unchanged appearance of right central venous catheter.  IMPRESSION: Mild cardiac enlargement.  No evidence of active pulmonary disease.   Electronically Signed   By: Lucienne Capers M.D.   On: 01/31/2015 01:24   Ct Angio Chest Pe W/cm &/or Wo Cm  01/31/2015   CLINICAL DATA:  Patient woke up this evening with central chest pain and shortness of breath.  EXAM: CT ANGIOGRAPHY CHEST WITH CONTRAST  TECHNIQUE: Multidetector CT imaging of the chest was performed using the standard protocol during bolus administration of intravenous contrast. Multiplanar CT image reconstructions and  MIPs were obtained to evaluate the vascular anatomy.  CONTRAST:  164m OMNIPAQUE IOHEXOL 350 MG/ML SOLN  COMPARISON:  10/07/2014  FINDINGS: Technically adequate study with good opacification of the central and segmental pulmonary arteries. No focal filling defects are demonstrated. No evidence of significant pulmonary embolus.  Cardiac enlargement. Mild dilatation of the proximal descending thoracic aorta at 3.8 cm diameter. No change since prior study. Enlarged lymph nodes demonstrated throughout the mediastinum. Largest lymph node is in the pretracheal region and measures about 16 mm short axis dimension. Lymph nodes appear similar to prior study and may represent reactive change. Esophagus is decompressed.  Evaluation of lungs is limited due to respiratory motion artifact. No evidence of focal consolidation or airspace disease. Airways appear patent. No pleural effusions. No pneumothorax.  Included portions of the upper abdominal organs demonstrate retrograde opacification of the hepatic veins suggesting right heart failure. No destructive bone lesions.  Review of the MIP images confirms the above findings.  IMPRESSION: Cardiac enlargement and contrast reflux into the hepatic veins suggesting right heart failure. No evidence of significant pulmonary embolus.   Electronically Signed   By: WLucienne CapersM.D.   On: 01/31/2015 05:02      Assessment/Plan Principal Problem:   Hyperkalemia Active Problems:   Chronic combined systolic and diastolic CHF (congestive heart failure)   DM (diabetes mellitus), type 2, uncontrolled, with renal complications   Accelerated hypertension   Volume overload   1. Chest Pain -will admit to telemetry -will get serial enzymes -currently he is pain free and his symptoms are atypical  2. Hyperkalemia -acute dialysis started per REnal -will repeat labs as needed  3. Volume Overload -on hemodialysis as ordered per Renal -monitor IOs -monitor  weights  4. Chronic Systolic and Diastolic Heart Failure -continue with current medications -last EF was 35-40%  5. DM II with renal complications -  will monitor FSBS -SSI coverage as needed -will check A1C  6. Hyperlipidemia -will continue with statins  7. Chronic Anticoagulation due to h/o thromboembolic disease -on coumadin will be continued -pharmacy to dose  8. Accelerated HTN -?compliance with therapy -will resume Hydralizine and also with lisinopril     Code Status: Full Code (must indicate code status--if unknown or must be presumed, indicate so) DVT Prophylaxis:Heparin Family Communication: none (indicate person spoken with, if applicable, with phone number if by telephone) Disposition Plan: home (indicate anticipated LOS)  Time spent: 84mn  Baylon Santelli A [facility] Pager [phone]

## 2015-01-31 NOTE — ED Provider Notes (Signed)
CSN: [id number]     Arrival date & time 01/31/15  0037 History    This chart was scribed for Merryl Hacker, MD by [name], ED Scribe. This patient was seen in room A12C/A12C and the patient's care was started 2:14 AM.   Chief Complaint  Patient presents with  . Chest Pain   The history is provided by the patient. No language interpreter was used.    HPI Comments: Frank [name] is a 49 y.o. male with a PMHx of HTN, DM, ESRD, renal insufficiency, and CHF who presents to the Emergency Department complaining of constant, ongoing, unchanged central chest pain onset 9 PM this evening that woke him from sleep. States "it feels like my blood clot."  Pain is currently rated 7/10. No aggravating or alleviating factors at this time. There is also associated shortness of breath. No OTC medications or home remedies attempted prior to arrival. No recent fever, chills, nausea, abdominal pain, vomiting, or diarrhea. Last hemodialysis treatment Saturday 7/9 with next scheduled treatment tomorrow Tuesday 7/12. States during treatment on Saturday, hemoglobin measured low. Mr. Cyr was recent evaluated here in the Emergency Department for chest pain on 7/7 and safely discharged home. PSHx includes Left heart catheterization with coronary angiogram-08/2014. Pt with known allergies to Ferrlecit and Darvocet.  Past Medical History  Diagnosis Date  . Hypertension   . Depression   . Complication of anesthesia     itching, sore throat  . Diabetes mellitus without complication     No history per patient, but remains under history as A1c would not be accurate given on dialysis  . Shortness of breath   . Anxiety   . ESRD (end stage renal disease)     due to HTN per patient, followed at [facility], s/p failed kidney transplant - dialysis Tue, Th, Sat  . Renal insufficiency   . CHF (congestive heart failure)   . Anemia   . Dialysis patient    Past Surgical History  Procedure Laterality Date  . Kidney receipient   2006    failed and started HD in March 2014  . Capd insertion    . Capd removal    . Left heart catheterization with coronary angiogram N/A 09/02/2014    Procedure: LEFT HEART CATHETERIZATION WITH CORONARY ANGIOGRAM;  Surgeon: [name], MD;  Location: [facility] CATH LAB;  Service: Cardiovascular;  Laterality: N/A;   No family history on file. History  Substance Use Topics  . Smoking status: Former Smoker -- 1.00 packs/day for 1 years    Types: Cigarettes  . Smokeless tobacco: Never Used     Comment: quit Jan 2014  . Alcohol Use: No    Review of Systems  Constitutional: Negative for fever and chills.  Respiratory: Positive for shortness of breath. Negative for cough.   Cardiovascular: Positive for chest pain.  Gastrointestinal: Negative for nausea, vomiting, abdominal pain and diarrhea.  Neurological: Negative for dizziness, weakness, light-headedness and numbness.  Psychiatric/Behavioral: Negative for confusion.  All other systems reviewed and are negative.     Allergies  Ferrlecit and Darvocet  Home Medications   Prior to Admission medications   Medication Sig Start Date End Date Taking? Authorizing Provider  atorvastatin (LIPITOR) 40 MG tablet Take 1 tablet (40 mg total) by mouth daily at 6 PM. 09/02/14  Yes Barton Dubois, MD  carvedilol (COREG) 25 MG tablet Take 1 tablet (25 mg total) by mouth 2 (two) times daily with a meal. 10/10/14  Yes Tomi Bamberger  Vann, DO  cinacalcet (SENSIPAR) 30 MG tablet Take 30 mg by mouth daily.   Yes Historical Provider, MD  diclofenac sodium (VOLTAREN) 1 % GEL Apply 2 g topically 4 (four) times daily. 11/14/14  Yes Tresa Garter, MD  dicyclomine (BENTYL) 20 MG tablet Take 1 tablet (20 mg total) by mouth 2 (two) times daily. 12/20/14  Yes Ernestina Patches, MD  docusate sodium (COLACE) 100 MG capsule Take 1 capsule (100 mg total) by mouth 2 (two) times daily. 11/19/14  Yes [name], MD  doxercalciferol (HECTOROL) 4 MCG/2ML injection Inject  1.25 mLs (2.5 mcg total) into the vein Every Tuesday,Thursday,and Saturday with dialysis. 10/10/14  Yes [name], DO  hydrALAZINE (APRESOLINE) 25 MG tablet Take 1 tablet (25 mg total) by mouth every 8 (eight) hours. 01/03/15  Yes [name], MD  levalbuterol [facility] HFA) 45 MCG/ACT inhaler Inhale 2 puffs into the lungs every 6 (six) hours as needed for wheezing or shortness of breath. 11/19/14  Yes [name], MD  lisinopril (PRINIVIL,ZESTRIL) 20 MG tablet Take 1 tablet (20 mg total) by mouth daily. 12/05/14  Yes Verlee Monte, MD  LORazepam (ATIVAN) 0.5 MG tablet Take 1 tablet (0.5 mg total) by mouth at bedtime as needed for anxiety. 11/19/14  Yes [name], MD  methocarbamol (ROBAXIN) 500 MG tablet Take 2 tablets (1,000 mg total) by mouth every 8 (eight) hours as needed for muscle spasms. 01/26/15  Yes [name], MD  omeprazole (PRILOSEC) 20 MG capsule Take 20 mg by mouth daily. 07/01/13  Yes Historical Provider, MD  ondansetron (ZOFRAN) 4 MG tablet Take 1 tablet (4 mg total) by mouth every 6 (six) hours. 12/20/14  Yes Ernestina Patches, MD  ondansetron (ZOFRAN-ODT) 4 MG disintegrating tablet Take 1 tablet (4 mg total) by mouth every 8 (eight) hours as needed for nausea or vomiting. 12/05/14  Yes Verlee Monte, MD  predniSONE (DELTASONE) 5 MG tablet Take 5 mg by mouth daily with breakfast.   Yes Historical Provider, MD  warfarin (COUMADIN) 4 MG tablet Take 1 tablet (4 mg total) by mouth daily at 6 PM. 12/05/14  Yes Verlee Monte, MD   Triage Vitals: BP 187/135 mmHg  Pulse 92  Temp(Src) 97.5 F (36.4 C) (Oral)  Resp 11  Ht 6' 1.5" (1.867 m)  Wt 180 lb 1 oz (81.676 kg)  BMI 23.43 kg/m2  SpO2 99%   Physical Exam  Constitutional: He is oriented to person, place, and time. No distress.  Chronically ill-appearing  HENT:  Head: Normocephalic and atraumatic.  Eyes: Pupils are equal, round, and reactive to light.  Neck: Neck supple.  Cardiovascular: Normal rate, regular rhythm and  normal heart sounds.   No murmur heard. Pulmonary/Chest: Effort normal and breath sounds normal. No respiratory distress. He has no wheezes. He exhibits tenderness.  Tenderness to palpation over the anterior chest wall, tunneled Port-A-Cath in the right subclavian  Abdominal: Soft. Bowel sounds are normal. There is no tenderness. There is no rebound.  Musculoskeletal: He exhibits edema.  1+ bilateral lower extremity edema  Lymphadenopathy:    He has no cervical adenopathy.  Neurological: He is alert and oriented to person, place, and time.  Skin: Skin is warm and dry.  Scaly  Psychiatric: He has a normal mood and affect.  Nursing note and vitals reviewed.   ED Course  Procedures (including critical care time)  DIAGNOSTIC STUDIES: Oxygen Saturation is 100% on ra, Normal by my interpretation.    COORDINATION OF  CARE: 2:2AM- Will order BMP, CXR, EKG, i-stat troponin I,Lipase, Hepatic function panel, and CBC. Will give Zofran. Discussed treatment plan with pt at bedside and pt agreed to plan.     Labs Review Labs Reviewed  BASIC METABOLIC PANEL - Abnormal; Notable for the following:    BUN 55 (*)    Creatinine, Ser 10.65 (*)    Calcium 8.5 (*)    GFR calc non Af Amer 5 (*)    GFR calc Af Amer 6 (*)    All other components within normal limits  CBC - Abnormal; Notable for the following:    RBC 3.46 (*)    Hemoglobin 9.2 (*)    HCT 28.9 (*)    RDW 17.2 (*)    All other components within normal limits  HEPATIC FUNCTION PANEL - Abnormal; Notable for the following:    Albumin 2.3 (*)    AST 137 (*)    ALT 72 (*)    Bilirubin, Direct <0.1 (*)    All other components within normal limits  PROTIME-INR - Abnormal; Notable for the following:    Prothrombin Time 15.9 (*)    All other components within normal limits  LIPASE, BLOOD  I-STAT TROPOININ, ED    Imaging Review Dg Chest 2 View  01/31/2015   CLINICAL DATA:  Shortness of breath. Left-sided chest pain. Dialysis patient.  History of blood clots.  EXAM: CHEST  2 VIEW  COMPARISON:  01/26/2015  FINDINGS: Mild cardiac enlargement without significant vascular congestion. Mediastinal contours appear intact. No focal airspace disease or consolidation in the lungs. No blunting of costophrenic angles. No pneumothorax. Unchanged appearance of right central venous catheter.  IMPRESSION: Mild cardiac enlargement.  No evidence of active pulmonary disease.   Electronically Signed   By: Lucienne Capers M.D.   On: 01/31/2015 01:24   Ct Angio Chest Pe W/cm &/or Wo Cm  01/31/2015   CLINICAL DATA:  Patient woke up this evening with central chest pain and shortness of breath.  EXAM: CT ANGIOGRAPHY CHEST WITH CONTRAST  TECHNIQUE: Multidetector CT imaging of the chest was performed using the standard protocol during bolus administration of intravenous contrast. Multiplanar CT image reconstructions and MIPs were obtained to evaluate the vascular anatomy.  CONTRAST:  168m OMNIPAQUE IOHEXOL 350 MG/ML SOLN  COMPARISON:  10/07/2014  FINDINGS: Technically adequate study with good opacification of the central and segmental pulmonary arteries. No focal filling defects are demonstrated. No evidence of significant pulmonary embolus.  Cardiac enlargement. Mild dilatation of the proximal descending thoracic aorta at 3.8 cm diameter. No change since prior study. Enlarged lymph nodes demonstrated throughout the mediastinum. Largest lymph node is in the pretracheal region and measures about 16 mm short axis dimension. Lymph nodes appear similar to prior study and may represent reactive change. Esophagus is decompressed.  Evaluation of lungs is limited due to respiratory motion artifact. No evidence of focal consolidation or airspace disease. Airways appear patent. No pleural effusions. No pneumothorax.  Included portions of the upper abdominal organs demonstrate retrograde opacification of the hepatic veins suggesting right heart failure. No destructive bone  lesions.  Review of the MIP images confirms the above findings.  IMPRESSION: Cardiac enlargement and contrast reflux into the hepatic veins suggesting right heart failure. No evidence of significant pulmonary embolus.   Electronically Signed   By: WLucienne CapersM.D.   On: 01/31/2015 05:02     EKG Interpretation   Date/Time:  Tuesday January 31 2015 00:44:34 EDT Ventricular Rate:  93  PR Interval:  168 QRS Duration: 100 QT Interval:  404 QTC Calculation: 502 R Axis:   -51 Text Interpretation:  Normal sinus rhythm Possible Left atrial enlargement  Left axis deviation Left ventricular hypertrophy ST \T\ T wave  abnormality, consider inferior ischemia Prolonged QT Abnormal ECG No  significant change since last tracing Confirmed by HORTON  MD, COURTNEY  ([id number]) on 01/31/2015 1:28:51 AM      MDM   Final diagnoses:  Other chest pain    Patient presents with chest pain. Patient has had multiple emergency department visits and evaluations for the same. However, he does have multiple medical problems. He does have a history of PE. Reports that he is taking his Coumadin. Basic labwork obtained including INR. EKG is unchanged from prior without acute ischemic changes.  Chest x-ray shows cardiomegaly without LEE. Patient does have a history of CHF. Feel a BNP would be inaccurate given patient's end-stage renal disease. He does have some lower extremity edema  but is due for dialysis.  He is satting 99% on room air. He is not therapeutic with his Coumadin. For this reason, will obtain CT angiogram to rule out PE. CT is negative; however, does show evidence of right-sided heart failure. This is likely a reflection of the patient's volume status and need for dialysis. Patient recurrently asked for pain medication while in the ED. He has a reproducible nature to the pain. At this time I do not feel he has an acute emergent condition. He was instructed to follow-up with dialysis later today. He stated  understanding.  After history, exam, and medical workup I feel the patient has been appropriately medically screened and is safe for discharge home. Pertinent diagnoses were discussed with the patient. Patient was given return precautions.  I personally performed the services described in this documentation, which was scribed in my presence. The recorded information has been reviewed and is accurate.   Merryl Hacker, MD 01/31/15 [phone]

## 2015-01-31 NOTE — ED Notes (Signed)
Pt last dialysis was last Friday.

## 2015-01-31 NOTE — ED Provider Notes (Signed)
CSN: [id number]     Arrival date & time 01/31/15  1349 History   First MD Initiated Contact with Patient 01/31/15 1507     Chief Complaint  Patient presents with  . Chest Pain  . Vascular Access Problem     (Consider location/radiation/quality/duration/timing/severity/associated sxs/prior Treatment) HPI Patient presents with concern of ongoing chest pain. Pain began seemingly yesterday, has been persistent. Pain is dull diffuse, anterior. Patient cannot specify exacerbating or alleviating factors. Patient states that he has similar pain frequently. He denies other current complaints. Patient was sent here from dialysis, after he was found to be hypertensive. Patient was here yesterday, discharged after declining dialysis. Patient denies other recent health issues.  Past Medical History  Diagnosis Date  . Hypertension   . Depression   . Complication of anesthesia     itching, sore throat  . Diabetes mellitus without complication     No history per patient, but remains under history as A1c would not be accurate given on dialysis  . Shortness of breath   . Anxiety   . ESRD (end stage renal disease)     due to HTN per patient, followed at [facility], s/p failed kidney transplant - dialysis Tue, Th, Sat  . Renal insufficiency   . CHF (congestive heart failure)   . Anemia   . Dialysis patient    Past Surgical History  Procedure Laterality Date  . Kidney receipient  2006    failed and started HD in March 2014  . Capd insertion    . Capd removal    . Left heart catheterization with coronary angiogram N/A 09/02/2014    Procedure: LEFT HEART CATHETERIZATION WITH CORONARY ANGIOGRAM;  Surgeon: [name], MD;  Location: [facility] CATH LAB;  Service: Cardiovascular;  Laterality: N/A;   No family history on file. History  Substance Use Topics  . Smoking status: Former Smoker -- 1.00 packs/day for 1 years    Types: Cigarettes  . Smokeless tobacco: Never Used     Comment: quit Jan  2014  . Alcohol Use: No    Review of Systems  Constitutional:       Per HPI, otherwise negative  HENT:       Per HPI, otherwise negative  Respiratory:       Per HPI, otherwise negative  Cardiovascular:       Per HPI, otherwise negative  Gastrointestinal: Negative for vomiting.  Endocrine:       Negative aside from HPI  Genitourinary:       Neg aside from HPI   Musculoskeletal:       Per HPI, otherwise negative  Skin: Negative.   Allergic/Immunologic:       Prior kidney transplant, now failed  Neurological: Negative for syncope.      Allergies  Ferrlecit and Darvocet  Home Medications   Prior to Admission medications   Medication Sig Start Date End Date Taking? Authorizing Provider  atorvastatin (LIPITOR) 40 MG tablet Take 1 tablet (40 mg total) by mouth daily at 6 PM. 09/02/14   Barton Dubois, MD  carvedilol (COREG) 25 MG tablet Take 1 tablet (25 mg total) by mouth 2 (two) times daily with a meal. 10/10/14   [name], DO  cinacalcet (SENSIPAR) 30 MG tablet Take 30 mg by mouth daily.    Historical Provider, MD  diclofenac sodium (VOLTAREN) 1 % GEL Apply 2 g topically 4 (four) times daily. 11/14/14   Tresa Garter, MD  dicyclomine (BENTYL) 20 MG  tablet Take 1 tablet (20 mg total) by mouth 2 (two) times daily. 12/20/14   Ernestina Patches, MD  docusate sodium (COLACE) 100 MG capsule Take 1 capsule (100 mg total) by mouth 2 (two) times daily. 11/19/14   [name], MD  doxercalciferol (HECTOROL) 4 MCG/2ML injection Inject 1.25 mLs (2.5 mcg total) into the vein Every Tuesday,Thursday,and Saturday with dialysis. 10/10/14   [name], DO  hydrALAZINE (APRESOLINE) 25 MG tablet Take 1 tablet (25 mg total) by mouth every 8 (eight) hours. 01/03/15   [name], MD  levalbuterol [facility] HFA) 45 MCG/ACT inhaler Inhale 2 puffs into the lungs every 6 (six) hours as needed for wheezing or shortness of breath. 11/19/14   [name], MD  lisinopril (PRINIVIL,ZESTRIL) 20 MG  tablet Take 1 tablet (20 mg total) by mouth daily. 12/05/14   Verlee Monte, MD  LORazepam (ATIVAN) 0.5 MG tablet Take 1 tablet (0.5 mg total) by mouth at bedtime as needed for anxiety. 11/19/14   [name], MD  methocarbamol (ROBAXIN) 500 MG tablet Take 2 tablets (1,000 mg total) by mouth every 8 (eight) hours as needed for muscle spasms. 01/26/15   [name], MD  omeprazole (PRILOSEC) 20 MG capsule Take 20 mg by mouth daily. 07/01/13   Historical Provider, MD  ondansetron (ZOFRAN) 4 MG tablet Take 1 tablet (4 mg total) by mouth every 6 (six) hours. 12/20/14   Ernestina Patches, MD  ondansetron (ZOFRAN-ODT) 4 MG disintegrating tablet Take 1 tablet (4 mg total) by mouth every 8 (eight) hours as needed for nausea or vomiting. 12/05/14   Verlee Monte, MD  predniSONE (DELTASONE) 5 MG tablet Take 5 mg by mouth daily with breakfast.    Historical Provider, MD  warfarin (COUMADIN) 4 MG tablet Take 1 tablet (4 mg total) by mouth daily at 6 PM. 12/05/14   Mutaz Elmahi, MD   BP 171/120 mmHg  Pulse 82  Temp(Src) 97.6 F (36.4 C)  Resp 15  Ht 6' 1.5" (1.867 m)  Wt 182 lb (82.555 kg)  BMI 23.68 kg/m2  SpO2 99% Physical Exam  Constitutional: He is oriented to person, place, and time.  Chronically ill-appearing male, sleeping, lying in a right decubitus lateral position. Patient awakens easily, answers questions briefly, falls back asleep quickly.  HENT:  Head: Normocephalic and atraumatic.  Eyes: Pupils are equal, round, and reactive to light.  Neck: Normal range of motion.  Cardiovascular: Normal rate and regular rhythm.   Pulmonary/Chest: Effort normal. No respiratory distress.  Abdominal: He exhibits no distension.  Musculoskeletal:  1+ bilateral lower extremity edema  Neurological: He is oriented to person, place, and time.  Patient sleeping, but awakens easily, answers questions appropriately, moves all extremities spontaneously.  Skin: Skin is warm and dry.  Scaly  Psychiatric: His speech  is slurred.  Nursing note and vitals reviewed.   ED Course  Procedures (including critical care time) Labs Review Labs Reviewed  BASIC METABOLIC PANEL - Abnormal; Notable for the following:    Potassium 6.7 (*)    CO2 21 (*)    BUN 66 (*)    Creatinine, Ser 12.07 (*)    Calcium 8.3 (*)    GFR calc non Af Amer 4 (*)    GFR calc Af Amer 5 (*)    All other components within normal limits  CBC - Abnormal; Notable for the following:    RBC 3.41 (*)    Hemoglobin 9.3 (*)    HCT 28.6 (*)  RDW 17.2 (*)    All other components within normal limits  I-STAT CHEM 8, ED - Abnormal; Notable for the following:    Potassium 6.5 (*)    BUN 64 (*)    Creatinine, Ser 12.10 (*)    Glucose, Bld 64 (*)    Calcium, Ion 1.08 (*)    Hemoglobin 10.5 (*)    HCT 31.0 (*)    All other components within normal limits    Imaging Review Dg Chest 2 View  01/31/2015   CLINICAL DATA:  Shortness of breath. Left-sided chest pain. Dialysis patient. History of blood clots.  EXAM: CHEST  2 VIEW  COMPARISON:  01/26/2015  FINDINGS: Mild cardiac enlargement without significant vascular congestion. Mediastinal contours appear intact. No focal airspace disease or consolidation in the lungs. No blunting of costophrenic angles. No pneumothorax. Unchanged appearance of right central venous catheter.  IMPRESSION: Mild cardiac enlargement.  No evidence of active pulmonary disease.   Electronically Signed   By: Lucienne Capers M.D.   On: 01/31/2015 01:24   Ct Angio Chest Pe W/cm &/or Wo Cm  01/31/2015   CLINICAL DATA:  Patient woke up this evening with central chest pain and shortness of breath.  EXAM: CT ANGIOGRAPHY CHEST WITH CONTRAST  TECHNIQUE: Multidetector CT imaging of the chest was performed using the standard protocol during bolus administration of intravenous contrast. Multiplanar CT image reconstructions and MIPs were obtained to evaluate the vascular anatomy.  CONTRAST:  126m OMNIPAQUE IOHEXOL 350 MG/ML SOLN   COMPARISON:  10/07/2014  FINDINGS: Technically adequate study with good opacification of the central and segmental pulmonary arteries. No focal filling defects are demonstrated. No evidence of significant pulmonary embolus.  Cardiac enlargement. Mild dilatation of the proximal descending thoracic aorta at 3.8 cm diameter. No change since prior study. Enlarged lymph nodes demonstrated throughout the mediastinum. Largest lymph node is in the pretracheal region and measures about 16 mm short axis dimension. Lymph nodes appear similar to prior study and may represent reactive change. Esophagus is decompressed.  Evaluation of lungs is limited due to respiratory motion artifact. No evidence of focal consolidation or airspace disease. Airways appear patent. No pleural effusions. No pneumothorax.  Included portions of the upper abdominal organs demonstrate retrograde opacification of the hepatic veins suggesting right heart failure. No destructive bone lesions.  Review of the MIP images confirms the above findings.  IMPRESSION: Cardiac enlargement and contrast reflux into the hepatic veins suggesting right heart failure. No evidence of significant pulmonary embolus.   Electronically Signed   By: WLucienne CapersM.D.   On: 01/31/2015 05:02    EKG with sinus rhythm 84, T-wave abnormalities, borderline long QT, abnormal Cardiac monitor 85 sinus normal Pulse ox 99% room air normal   CTA 01/31/15 IMPRESSION: Cardiac enlargement and contrast reflux into the hepatic veins suggesting right heart failure. No evidence of significant pulmonary Embolus.   ECHO 11/30/14 Left ventricle:  The cavity size was normal. There was mild concentric hypertrophy. Systolic function was moderately reduced. The estimated ejection fraction was in the range of 35% to 40%. Diffuse hypokinesis. Features are consistent with a pseudonormal left ventricular filling pattern, with concomitant abnormal relaxation and increased filling  pressure (grade 2 diastolic dysfunction).   On repeat exam the patient appears calm. I discussed the patient's case with our nephrology colleagues. With concern for hyperkalemia, hypertension, and his multiple medical issues, patient will have emergent dialysis.  Patient has 32 emergency department visits in 6 months.  MDM  Patient  presents with ongoing chest pain. Here the patient is seemingly calm, in no distress, but is hypertensive. Patient has multiple recent evaluations for chest pain, has documented heart failure. Today's evaluation does not suggest ongoing coronary ischemia. However, patient has hyperkalemia, and given his comorbidities, emergent hemodialysis was arranged. Patient should be appropriate for discharge after dialysis, monitoring.   CRITICAL CARE Performed by: Carmin Muskrat Total critical care time: 35 Critical care time was exclusive of separately billable procedures and treating other patients. Critical care was necessary to treat or prevent imminent or life-threatening deterioration. Critical care was time spent personally by me on the following activities: development of treatment plan with patient and/or surrogate as well as nursing, discussions with consultants, evaluation of patient's response to treatment, examination of patient, obtaining history from patient or surrogate, ordering and performing treatments and interventions, ordering and review of laboratory studies, ordering and review of radiographic studies, pulse oximetry and re-evaluation of patient's condition.   Carmin Muskrat, MD 01/31/15 [phone]

## 2015-01-31 NOTE — Progress Notes (Signed)
Refused NSL to be started. 'Will do it tomorrow."

## 2015-01-31 NOTE — ED Notes (Signed)
Attempted to get and EKG, but pt is adamantly refusing to have anything done until he has talked to [place] Charge nurse

## 2015-01-31 NOTE — ED Notes (Signed)
Pt. woke up this evening with central chest pain and SOB , no nausea or diaphoresis , last hemodialysis Saturday .

## 2015-01-31 NOTE — Progress Notes (Signed)
ANTICOAGULATION CONSULT NOTE - Initial Consult  Pharmacy Consult for warfarin Indication: DVT  Allergies  Allergen Reactions  . Ferrlecit [Na Ferric Gluc Cplx In Sucrose] Shortness Of Breath, Swelling and Other (See Comments)    Swelling in throat  . Darvocet [Propoxyphene N-Acetaminophen] Hives    Patient Measurements: Height: 6' 1.5" (186.7 cm) Weight: 174 lb 2.6 oz (79 kg) IBW/kg (Calculated) : 81.05  Vital Signs: Temp: 98.5 F (36.9 C) (07/12 2059) Temp Source: Oral (07/12 2059) BP: 192/143 mmHg (07/12 2059) Pulse Rate: 100 (07/12 2059)  Labs:  Recent Labs  01/31/15 0046 01/31/15 0200 01/31/15 1433 01/31/15 1444  HGB 9.2*  --  9.3* 10.5*  HCT 28.9*  --  28.6* 31.0*  PLT 299  --  264  --   LABPROT  --  15.9*  --   --   INR  --  1.25  --   --   CREATININE 10.65*  --  12.07* 12.10*    Estimated Creatinine Clearance: 8.2 mL/min (by C-G formula based on Cr of 12.1).   Medical History: Past Medical History  Diagnosis Date  . Hypertension   . Depression   . Complication of anesthesia     itching, sore throat  . Diabetes mellitus without complication     No history per patient, but remains under history as A1c would not be accurate given on dialysis  . Shortness of breath   . Anxiety   . ESRD (end stage renal disease)     due to HTN per patient, followed at [facility], s/p failed kidney transplant - dialysis Tue, Th, Sat  . Renal insufficiency   . CHF (congestive heart failure)   . Anemia   . Dialysis patient     Assessment: 28 yom ESRD- HD TTS patient admitted with CP. Patient on warfarin pta for recent DVT - pharmacy consulted to dose. INR subtherapeutic on admit 1.25. Hg 10.5, plt wnl. No bleed documented.   PTA warfarin dose: 4 mg daily (last dose "in the last day or two" per patient)  Goal of Therapy:  INR 2-3 Monitor platelets by anticoagulation protocol: Yes   Plan:  Warfarin 80m x 1 dose tonight  Daily INR  Mon s/sx bleed  HElicia Lamp  PharmD Clinical Pharmacist - Resident Pager [phone]/06/2015 9:24 PM

## 2015-01-31 NOTE — Discharge Instructions (Signed)
You were seen today for chest pain. Your workup is reassuring. There is no evidence of pulmonary embolism. Your heart tests are reassuring. Your pain is reproducible on exam which suggests a chest wall component.  You do have signs of volume overload. You need to have dialysis as scheduled later today.  Chest Pain (Nonspecific) It is often hard to give a specific diagnosis for the cause of chest pain. There is always a chance that your pain could be related to something serious, such as a heart attack or a blood clot in the lungs. You need to follow up with your health care provider for further evaluation. CAUSES   Heartburn.  Pneumonia or bronchitis.  Anxiety or stress.  Inflammation around your heart (pericarditis) or lung (pleuritis or pleurisy).  A blood clot in the lung.  A collapsed lung (pneumothorax). It can develop suddenly on its own (spontaneous pneumothorax) or from trauma to the chest.  Shingles infection (herpes zoster virus). The chest wall is composed of bones, muscles, and cartilage. Any of these can be the source of the pain.  The bones can be bruised by injury.  The muscles or cartilage can be strained by coughing or overwork.  The cartilage can be affected by inflammation and become sore (costochondritis). DIAGNOSIS  Lab tests or other studies may be needed to find the cause of your pain. Your health care provider may have you take a test called an ambulatory electrocardiogram (ECG). An ECG records your heartbeat patterns over a 24-hour period. You may also have other tests, such as:  Transthoracic echocardiogram (TTE). During echocardiography, sound waves are used to evaluate how blood flows through your heart.  Transesophageal echocardiogram (TEE).  Cardiac monitoring. This allows your health care provider to monitor your heart rate and rhythm in real time.  Holter monitor. This is a portable device that records your heartbeat and can help diagnose heart  arrhythmias. It allows your health care provider to track your heart activity for several days, if needed.  Stress tests by exercise or by giving medicine that makes the heart beat faster. TREATMENT   Treatment depends on what may be causing your chest pain. Treatment may include:  Acid blockers for heartburn.  Anti-inflammatory medicine.  Pain medicine for inflammatory conditions.  Antibiotics if an infection is present.  You may be advised to change lifestyle habits. This includes stopping smoking and avoiding alcohol, caffeine, and chocolate.  You may be advised to keep your head raised (elevated) when sleeping. This reduces the chance of acid going backward from your stomach into your esophagus. Most of the time, nonspecific chest pain will improve within 2-3 days with rest and mild pain medicine.  HOME CARE INSTRUCTIONS   If antibiotics were prescribed, take them as directed. Finish them even if you start to feel better.  For the next few days, avoid physical activities that bring on chest pain. Continue physical activities as directed.  Do not use any tobacco products, including cigarettes, chewing tobacco, or electronic cigarettes.  Avoid drinking alcohol.  Only take medicine as directed by your health care provider.  Follow your health care provider's suggestions for further testing if your chest pain does not go away.  Keep any follow-up appointments you made. If you do not go to an appointment, you could develop lasting (chronic) problems with pain. If there is any problem keeping an appointment, call to reschedule. SEEK MEDICAL CARE IF:   Your chest pain does not go away, even after treatment.  You have a rash with blisters on your chest.  You have a fever. SEEK IMMEDIATE MEDICAL CARE IF:   You have increased chest pain or pain that spreads to your arm, neck, jaw, back, or abdomen.  You have shortness of breath.  You have an increasing cough, or you cough up  blood.  You have severe back or abdominal pain.  You feel nauseous or vomit.  You have severe weakness.  You faint.  You have chills. This is an emergency. Do not wait to see if the pain will go away. Get medical help at once. Call your local emergency services (911 in [place] Do not drive yourself to the hospital. MAKE SURE YOU:   Understand these instructions.  Will watch your condition.  Will get help right away if you are not doing well or get worse. Document Released: 04/17/2005 Document Revised: 07/13/2013 Document Reviewed: 02/11/2008 [facility] Patient Information 2015 [place], [place]. This information is not intended to replace advice given to you by your health care provider. Make sure you discuss any questions you have with your health care provider.

## 2015-01-31 NOTE — ED Notes (Signed)
Patient refused XR

## 2015-01-31 NOTE — ED Notes (Signed)
Pt asked to use the restroom. He refused to use a urinal due to having to empty his bowels. I stated that I needed to check with the nurse to see about his ambulation status and he refused. He unhooked himself from the monitor and started ambulating toward the restroom. I walked with him to and from the bathroom. He had to stop multiple times both ways to catch his breath. I offered a w/c multiple times and he refused. He was put on a [place] 2L once back into the room due to his O2 sats being 86%. He was left sitting on the edge of the bed to catch his breath. He refused to get into the bed due to his breathing being difficult for him.

## 2015-01-31 NOTE — ED Notes (Signed)
CT notified that the pt has an appropriate IV for his scan.

## 2015-02-01 DIAGNOSIS — I10 Essential (primary) hypertension: Secondary | ICD-10-CM

## 2015-02-01 DIAGNOSIS — Z765 Malingerer [conscious simulation]: Secondary | ICD-10-CM | POA: Diagnosis present

## 2015-02-01 DIAGNOSIS — F1910 Other psychoactive substance abuse, uncomplicated: Secondary | ICD-10-CM

## 2015-02-01 LAB — GLUCOSE, CAPILLARY: Glucose-Capillary: 161 mg/dL — ABNORMAL HIGH (ref 65–99)

## 2015-02-01 LAB — COMPREHENSIVE METABOLIC PANEL
ALBUMIN: 2 g/dL — AB (ref 3.5–5.0)
ALK PHOS: 114 U/L (ref 38–126)
ALT: 58 U/L (ref 17–63)
AST: 119 U/L — AB (ref 15–41)
Anion gap: 9 (ref 5–15)
BILIRUBIN TOTAL: 0.5 mg/dL (ref 0.3–1.2)
BUN: 36 mg/dL — ABNORMAL HIGH (ref 6–20)
CO2: 28 mmol/L (ref 22–32)
Calcium: 7.9 mg/dL — ABNORMAL LOW (ref 8.9–10.3)
Chloride: 98 mmol/L — ABNORMAL LOW (ref 101–111)
Creatinine, Ser: 7.93 mg/dL — ABNORMAL HIGH (ref 0.61–1.24)
GFR calc Af Amer: 8 mL/min — ABNORMAL LOW (ref >60)
GFR calc non Af Amer: 7 mL/min — ABNORMAL LOW (ref >60)
Glucose, Bld: 105 mg/dL — ABNORMAL HIGH (ref 65–99)
Potassium: 4.1 mmol/L (ref 3.5–5.1)
SODIUM: 135 mmol/L (ref 135–145)
TOTAL PROTEIN: 6.6 g/dL (ref 6.5–8.1)

## 2015-02-01 LAB — CBC
HEMATOCRIT: 27.1 % — AB (ref 39.0–52.0)
HEMOGLOBIN: 8.7 g/dL — AB (ref 13.0–17.0)
MCH: 26.2 pg (ref 26.0–34.0)
MCHC: 32.1 g/dL (ref 30.0–36.0)
MCV: 81.6 fL (ref 78.0–100.0)
PLATELETS: 261 10*3/uL (ref 150–400)
RBC: 3.32 MIL/uL — ABNORMAL LOW (ref 4.22–5.81)
RDW: 17.1 % — ABNORMAL HIGH (ref 11.5–15.5)
WBC: 5.1 10*3/uL (ref 4.0–10.5)

## 2015-02-01 LAB — PROTIME-INR
INR: 1.28 (ref 0.00–1.49)
Prothrombin Time: 16.1 seconds — ABNORMAL HIGH (ref 11.6–15.2)

## 2015-02-01 LAB — TROPONIN I
TROPONIN I: 0.05 ng/mL — AB (ref <0.031)
Troponin I: 0.07 ng/mL — ABNORMAL HIGH (ref <0.031)

## 2015-02-01 MED ORDER — WARFARIN SODIUM 6 MG PO TABS
6.0000 mg | ORAL_TABLET | Freq: Once | ORAL | Status: AC
  Administered 2015-02-01: 6 mg via ORAL
  Filled 2015-02-01: qty 1

## 2015-02-01 MED ORDER — SODIUM CHLORIDE 0.9 % IV SOLN: 100.0000 mL | INTRAVENOUS | Status: DC | PRN

## 2015-02-01 MED ORDER — LIDOCAINE-PRILOCAINE 2.5-2.5 % EX CREA: 1.0000 "application " | TOPICAL_CREAM | CUTANEOUS | Status: DC | PRN

## 2015-02-01 MED ORDER — OXYCODONE-ACETAMINOPHEN 5-325 MG PO TABS
2.0000 | ORAL_TABLET | Freq: Once | ORAL | Status: AC
  Administered 2015-02-01: 2 via ORAL
  Filled 2015-02-01: qty 2

## 2015-02-01 MED ORDER — LIDOCAINE HCL (PF) 1 % IJ SOLN: 5.0000 mL | INTRAMUSCULAR | Status: DC | PRN

## 2015-02-01 MED ORDER — ALTEPLASE 2 MG IJ SOLR
2.0000 mg | Freq: Once | INTRAMUSCULAR | Status: DC | PRN
  Filled 2015-02-01: qty 2

## 2015-02-01 MED ORDER — INSULIN ASPART 100 UNIT/ML ~~LOC~~ SOLN
0.0000 [IU] | Freq: Three times a day (TID) | SUBCUTANEOUS | Status: DC
Start: 2015-02-01 — End: 2015-02-04
  Administered 2015-02-02: 1 [IU] via SUBCUTANEOUS

## 2015-02-01 MED ORDER — NEPRO/CARBSTEADY PO LIQD: 237.0000 mL | ORAL | Status: DC | PRN

## 2015-02-01 MED ORDER — OXYCODONE-ACETAMINOPHEN 5-325 MG PO TABS
1.0000 | ORAL_TABLET | ORAL | Status: DC | PRN
  Administered 2015-02-02: 1 via ORAL
  Filled 2015-02-01 (×2): qty 1

## 2015-02-01 MED ORDER — HEPARIN SODIUM (PORCINE) 1000 UNIT/ML DIALYSIS
4500.0000 [IU] | INTRAMUSCULAR | Status: DC | PRN
  Filled 2015-02-01: qty 5

## 2015-02-01 MED ORDER — HEPARIN SODIUM (PORCINE) 1000 UNIT/ML DIALYSIS: 1000.0000 [IU] | INTRAMUSCULAR | Status: DC | PRN

## 2015-02-01 MED ORDER — HYDROCERIN EX CREA
TOPICAL_CREAM | Freq: Two times a day (BID) | CUTANEOUS | Status: DC
Start: 2015-02-01 — End: 2015-02-04
  Administered 2015-02-01 – 2015-02-04 (×6): via TOPICAL
  Filled 2015-02-01: qty 113

## 2015-02-01 MED ORDER — PENTAFLUOROPROP-TETRAFLUOROETH EX AERO: 1.0000 "application " | INHALATION_SPRAY | CUTANEOUS | Status: DC | PRN

## 2015-02-01 NOTE — Progress Notes (Signed)
PROGRESS NOTE  Frank [name] SWF:[id number] DOB: 09-25-1965 DOA: 01/31/2015 PCP: Angelica Chessman, MD  HPI/Recap of past 22 hours: 49 year old male past medical history of chronic systolic/diastolic heart failure, medical noncompliance, recent right upper extremity DVT and end-stage renal disease on hemodialysis admitted on 7/12 or chest pain and signs of volume overload. Patient has a previous history of being noncompliant with his dialysis. He was diagnosed with a right upper extremity DVT in May and restarted on his Coumadin, which she had been noncompliant with for a lower extremity DVT diagnosed last October.on admission, labs consistent with end-stage renal disease, INR of 1.28.  Patient admitted to hospitalist service and given dialysis that evening.  Enzymes cycled and found to be unrevealing.  Today, patient is doing okay. Complains of neck pain and chest pain.  Assessment/Plan: Principal Problem:   Left upper extremity deep vein thrombosis: Principal problem. Discussed this case with nephrology. Restarted on Coumadin. Once INR therapeutic, will discharge. Would likely section point of Coumadin in October. Active Problems:   Chronic combined systolic and diastolic CHF (congestive heart failure)colon managed by dialysis.    DM (diabetes mellitus), type 2, uncontrolled, with renal complications: Stable blood sugars, change sliding scale to 3 times a day     Hyperkalemia: secondary to end-stage renal disease and missing dialysis. Resolved.    Accelerated hypertension: suspect patient noncompliant at home with medications. Have resume these.    History of noncompliance with medical treatment: See below   Volume overload:   Drug-seeking behavior: I suspect that this is the underlying issue. Following patient's hospitalization in May, he was seen in the emergency room at [facility] and then 3 more times in the emergency room in this health system. He is often noncompliant and comes  in complaining of headache or chest pain. He requested prescriptions for OxyContin and Ativan from me. I explained to him that his primary care physician will be the only physician who we'll be giving him these prescriptions from here on out. We will need to develop a care plan so that unless there is objective evidence for issues, he not receive narcotics either in the emergency room or when hospitalized.     Code Status: full code  Family Communication: no family present  Disposition Plan: discharge once INR therapeutic   Consultants:  nephrology  Procedures:  hemodialysis  Antibiotics:  none   Objective: BP 172/100 mmHg  Pulse 99  Temp(Src) 98.1 F (36.7 C) (Oral)  Resp 18  Ht 6' 1.5" (1.867 m)  Wt 80.831 kg (178 lb 3.2 oz)  BMI 23.19 kg/m2  SpO2 100%  Intake/Output Summary (Last 24 hours) at 02/01/15 1330 Last data filed at 01/31/15 2300  Gross per 24 hour  Intake    220 ml  Output   2833 ml  Net  -2613 ml   Filed Weights   01/31/15 1650 01/31/15 2059 01/31/15 2124  Weight: 81.8 kg (180 lb 5.4 oz) 79 kg (174 lb 2.6 oz) 80.831 kg (178 lb 3.2 oz)    Exam:   General:  Alert and oriented 3, no acute distress  Cardiovascular: regular rate and rhythm, U2-G2, 2/6 systolic ejection murmur  Respiratory: clear to auscultation bilaterally  Abdomen: soft, nontender, nondistended, positive bowel sounds  Musculoskeletal: no clubbing or cyanosis, trace edema   Data Reviewed: Basic Metabolic Panel:  Recent Labs Lab 01/26/15 0421 01/31/15 0046 01/31/15 1433 01/31/15 1444 02/01/15 0139  NA 136 137 136 135 135  K 4.7 5.1 6.7* 6.5*  4.1  CL 101 102 101 104 98*  CO2 25 22 21*  --  28  GLUCOSE 94 94 67 64* 105*  BUN 53* 55* 66* 64* 36*  CREATININE 10.67* 10.65* 12.07* 12.10* 7.93*  CALCIUM 7.8* 8.5* 8.3*  --  7.9*   Liver Function Tests:  Recent Labs Lab 01/26/15 0421 01/31/15 0150 02/01/15 0139  AST 245* 137* 119*  ALT 108* 72* 58  ALKPHOS 132*  125 114  BILITOT 0.4 0.4 0.5  PROT 7.0 7.6 6.6  ALBUMIN 2.1* 2.3* 2.0*    Recent Labs Lab 01/31/15 0150  LIPASE 45   No results for input(s): AMMONIA in the last 168 hours. CBC:  Recent Labs Lab 01/26/15 0421 01/31/15 0046 01/31/15 1433 01/31/15 1444 02/01/15 0139  WBC 5.9 7.4 5.8  --  5.1  NEUTROABS 3.9  --   --   --   --   HGB 7.2* 9.2* 9.3* 10.5* 8.7*  HCT 23.1* 28.9* 28.6* 31.0* 27.1*  MCV 82.5 83.5 83.9  --  81.6  PLT 289 299 264  --  261   Cardiac Enzymes:    Recent Labs Lab 01/26/15 0421 02/01/15 0139  TROPONINI 0.06* 0.07*   BNP (last 3 results)  Recent Labs  11/26/14 2352 01/09/15 1529 01/26/15 0421  BNP >4500.0* 3606.1* >4500.0*    ProBNP (last 3 results)  Recent Labs  04/15/14 0809  PROBNP >70000.0*    CBG:  Recent Labs Lab 01/31/15 2113 01/31/15 2344  GLUCAP 91 128*    No results found for this or any previous visit (from the past 240 hour(s)).   Studies: No results found.  Scheduled Meds: . atorvastatin  40 mg Oral q1800  . carvedilol  25 mg Oral BID WC  . cinacalcet  30 mg Oral Q breakfast  . diclofenac sodium  2 g Topical QID  . dicyclomine  20 mg Oral BID  . docusate sodium  100 mg Oral BID  . [START ON 02/02/2015] doxercalciferol  2.5 mcg Intravenous Q T,Th,Sa-HD  . folic acid  1 mg Oral Daily  . hydrALAZINE  25 mg Oral 3 times per day  . hydrocerin   Topical BID  . insulin aspart  0-15 Units Subcutaneous 6 times per day  . lisinopril  20 mg Oral Daily  . multivitamin with minerals  1 tablet Oral Daily  . pantoprazole  40 mg Oral Daily  . predniSONE  5 mg Oral Q breakfast  . sodium chloride  3 mL Intravenous Q12H  . sodium chloride  3 mL Intravenous Q12H  . thiamine  100 mg Oral Daily  . warfarin  6 mg Oral ONCE-1800  . Warfarin - Pharmacist Dosing Inpatient   Does not apply q1800    Continuous Infusions:    Time spent: 25 minutes  [facility] Pager [phone]. If 7PM-7AM, please  contact night-coverage at [email or web address], password [facility] 02/01/2015, 1:30 PM  LOS: 1 day

## 2015-02-01 NOTE — Progress Notes (Signed)
[facility] Progress Note  Assessment/Plan: 1. Dyspnea/ chest pain - CXR negative, w/u per primary trop 0.6, 0.7- same as prior labs 2. SOB/ edema LE's / vol excess - net UF 2.8 with post weight 79- still 4# above EDW - sats ok, lungs clear 3. ESRD TTS [place] / failed renal tx lasted 8 yrs, 3 yrs back on HD- wants to transfer to [place], but tells me they won't accept without an access (using perm cath)-Planning HD again today to lower volume; K down to 4.1 post HD yesterday 4. PD cath - was trying to get back on PD this year but has not worked out 5. DM2 6. Anemia Hb 9.3, got one unit this past weekend at OP HD on Sat Hgb 8.7 7/13 ? If getting ESA regularly 7. HTN uncontrolled on coreg/ prinivil/ hydral - may need higher dose of hydralazine 8. Chronic pain - ? If some symptoms related to being "out of pain meds" for several days PTA  [name], PA-C [place] [phone] 02/01/2015,10:56 AM  LOS: 1 day   Pt seen, examined and agree w A/P as above.  Kelly Splinter MD pager [phone]    cell [phone] 02/01/2015, 1:21 PM    Subjective:   "Guess I'll need to get a AVF so [facility] will take me as a pt."  Doesn't have PCP. Gets pain meds from hospital d/c.  Out of pain meds. C/o pain in left neck, heaviness in chest which has been going on for a number of days prior to admission and still continues.Had cramping during HD treatment yesterday   Objective Filed Vitals:   01/31/15 2059 01/31/15 2124 02/01/15 0437 02/01/15 0841  BP: 192/143 190/146 169/131 172/100  Pulse: 100 103 103 99  Temp: 98.5 F (36.9 C) 98.7 F (37.1 C) 97.9 F (36.6 C) 98.1 F (36.7 C)  TempSrc: Oral Oral Oral Oral  Resp: _0 Height:      Weight: 79 kg (174 lb 2.6 oz) 80.831 kg (178 lb 3.2 oz)    SpO2: 98% 100% 100% 100%   Physical Exam General: NAD breathing easily on room air Heart: RRR Lungs: no rales Abdomen: soft NT Extremities: 1 + LE edema Dialysis  Access: right IJ catheter  Dialysis Orders: TTS [facility] on [address] 4h 170 lbs 2/2.5 bath   Additional Objective Labs: Lab Results  Component Value Date   INR 1.28 02/01/2015   INR 1.25 01/31/2015   INR 1.54* 20/25/4270   Basic Metabolic Panel:  Recent Labs Lab 01/31/15 0046 01/31/15 1433 01/31/15 1444 02/01/15 0139  NA 137 136 135 135  K 5.1 6.7* 6.5* 4.1  CL 102 101 104 98*  CO2 22 21*  --  28  GLUCOSE 94 67 64* 105*  BUN 55* 66* 64* 36*  CREATININE 10.65* 12.07* 12.10* 7.93*  CALCIUM 8.5* 8.3*  --  7.9*   Liver Function Tests:  Recent Labs Lab 01/26/15 0421 01/31/15 0150 02/01/15 0139  AST 245* 137* 119*  ALT 108* 72* 58  ALKPHOS 132* 125 114  BILITOT 0.4 0.4 0.5  PROT 7.0 7.6 6.6  ALBUMIN 2.1* 2.3* 2.0*    Recent Labs Lab 01/31/15 0150  LIPASE 45   CBC:  Recent Labs Lab 01/26/15 0421 01/31/15 0046 01/31/15 1433 01/31/15 1444 02/01/15 0139  WBC 5.9 7.4 5.8  --  5.1  NEUTROABS 3.9  --   --   --   --  HGB 7.2* 9.2* 9.3* 10.5* 8.7*  HCT 23.1* 28.9* 28.6* 31.0* 27.1*  MCV 82.5 83.5 83.9  --  81.6  PLT 289 299 264  --  261  Cardiac Enzymes:  Recent Labs Lab 01/26/15 0421 02/01/15 0139  TROPONINI 0.06* 0.07*   CBG:  Recent Labs Lab 01/31/15 2113 01/31/15 2344  GLUCAP 91 128*   Studies/Results: Dg Chest 2 View  01/31/2015   CLINICAL DATA:  Shortness of breath. Left-sided chest pain. Dialysis patient. History of blood clots.  EXAM: CHEST  2 VIEW  COMPARISON:  01/26/2015  FINDINGS: Mild cardiac enlargement without significant vascular congestion. Mediastinal contours appear intact. No focal airspace disease or consolidation in the lungs. No blunting of costophrenic angles. No pneumothorax. Unchanged appearance of right central venous catheter.  IMPRESSION: Mild cardiac enlargement.  No evidence of active pulmonary disease.   Electronically Signed   By: Lucienne Capers M.D.   On: 01/31/2015 01:24   Ct Angio Chest Pe W/cm  &/or Wo Cm  01/31/2015   CLINICAL DATA:  Patient woke up this evening with central chest pain and shortness of breath.  EXAM: CT ANGIOGRAPHY CHEST WITH CONTRAST  TECHNIQUE: Multidetector CT imaging of the chest was performed using the standard protocol during bolus administration of intravenous contrast. Multiplanar CT image reconstructions and MIPs were obtained to evaluate the vascular anatomy.  CONTRAST:  110m OMNIPAQUE IOHEXOL 350 MG/ML SOLN  COMPARISON:  10/07/2014  FINDINGS: Technically adequate study with good opacification of the central and segmental pulmonary arteries. No focal filling defects are demonstrated. No evidence of significant pulmonary embolus.  Cardiac enlargement. Mild dilatation of the proximal descending thoracic aorta at 3.8 cm diameter. No change since prior study. Enlarged lymph nodes demonstrated throughout the mediastinum. Largest lymph node is in the pretracheal region and measures about 16 mm short axis dimension. Lymph nodes appear similar to prior study and may represent reactive change. Esophagus is decompressed.  Evaluation of lungs is limited due to respiratory motion artifact. No evidence of focal consolidation or airspace disease. Airways appear patent. No pleural effusions. No pneumothorax.  Included portions of the upper abdominal organs demonstrate retrograde opacification of the hepatic veins suggesting right heart failure. No destructive bone lesions.  Review of the MIP images confirms the above findings.  IMPRESSION: Cardiac enlargement and contrast reflux into the hepatic veins suggesting right heart failure. No evidence of significant pulmonary embolus.   Electronically Signed   By: WLucienne CapersM.D.   On: 01/31/2015 05:02   Medications:   . atorvastatin  40 mg Oral q1800  . carvedilol  25 mg Oral BID WC  . cinacalcet  30 mg Oral Q breakfast  . diclofenac sodium  2 g Topical QID  . dicyclomine  20 mg Oral BID  . docusate sodium  100 mg Oral BID  .  [START ON 02/02/2015] doxercalciferol  2.5 mcg Intravenous Q T,Th,Sa-HD  . folic acid  1 mg Oral Daily  . hydrALAZINE  25 mg Oral 3 times per day  . hydrocerin   Topical BID  . insulin aspart  0-15 Units Subcutaneous 6 times per day  . lisinopril  20 mg Oral Daily  . multivitamin with minerals  1 tablet Oral Daily  . pantoprazole  40 mg Oral Daily  . predniSONE  5 mg Oral Q breakfast  . sodium chloride  3 mL Intravenous Q12H  . sodium chloride  3 mL Intravenous Q12H  . thiamine  100 mg Oral Daily  . warfarin  6 mg Oral ONCE-1800  . Warfarin - Pharmacist Dosing Inpatient   Does not apply [phone]

## 2015-02-01 NOTE — Progress Notes (Signed)
ANTICOAGULATION CONSULT NOTE - [place] for warfarin Indication: DVT  Allergies  Allergen Reactions  . Ferrlecit [Na Ferric Gluc Cplx In Sucrose] Shortness Of Breath, Swelling and Other (See Comments)    Swelling in throat  . Darvocet [Propoxyphene N-Acetaminophen] Hives    Patient Measurements: Height: 6' 1.5" (186.7 cm) Weight: 178 lb 3.2 oz (80.831 kg) IBW/kg (Calculated) : 81.05  Vital Signs: Temp: 97.9 F (36.6 C) (07/13 0437) Temp Source: Oral (07/13 0437) BP: 169/131 mmHg (07/13 0437) Pulse Rate: 103 (07/13 0437)  Labs:  Recent Labs  01/31/15 0046 01/31/15 0200 01/31/15 1433 01/31/15 1444 02/01/15 0139  HGB 9.2*  --  9.3* 10.5* 8.7*  HCT 28.9*  --  28.6* 31.0* 27.1*  PLT 299  --  264  --  261  LABPROT  --  15.9*  --   --  16.1*  INR  --  1.25  --   --  1.28  CREATININE 10.65*  --  12.07* 12.10* 7.93*  TROPONINI  --   --   --   --  0.07*    Estimated Creatinine Clearance: 12.7 mL/min (by C-G formula based on Cr of 7.93).   Medical History: Past Medical History  Diagnosis Date  . Hypertension   . Depression   . Complication of anesthesia     itching, sore throat  . Diabetes mellitus without complication     No history per patient, but remains under history as A1c would not be accurate given on dialysis  . Shortness of breath   . Anxiety   . ESRD (end stage renal disease)     due to HTN per patient, followed at [facility], s/p failed kidney transplant - dialysis Tue, Th, Sat  . Renal insufficiency   . CHF (congestive heart failure)   . Anemia   . Dialysis patient     Assessment: 46 yom ESRD- HD TTS patient admitted with CP. Patient on warfarin pta for recent DVT - pharmacy consulted to dose. INR subtherapeutic on admit 1.25. Hg 8.7, plt wnl. No bleed documented.   Today's INR 1.28  PTA warfarin dose: 4 mg daily (last dose "in the last day or two" per patient)  Goal of Therapy:  INR 2-3 Monitor platelets by  anticoagulation protocol: Yes   Plan:  Warfarin 37m x 1 dose tonight  Daily INR  Mon s/sx bleed  JUvaldo Rising BCPS  Clinical Pharmacist Pager [phone] 02/01/2015 7:40 AM

## 2015-02-02 DIAGNOSIS — N186 End stage renal disease: Secondary | ICD-10-CM

## 2015-02-02 DIAGNOSIS — Z992 Dependence on renal dialysis: Secondary | ICD-10-CM

## 2015-02-02 LAB — RENAL FUNCTION PANEL
Albumin: 1.7 g/dL — ABNORMAL LOW (ref 3.5–5.0)
Anion gap: 7 (ref 5–15)
BUN: 32 mg/dL — ABNORMAL HIGH (ref 6–20)
CO2: 26 mmol/L (ref 22–32)
Calcium: 7.2 mg/dL — ABNORMAL LOW (ref 8.9–10.3)
Chloride: 106 mmol/L (ref 101–111)
Creatinine, Ser: 6.86 mg/dL — ABNORMAL HIGH (ref 0.61–1.24)
GFR calc Af Amer: 10 mL/min — ABNORMAL LOW (ref >60)
GFR calc non Af Amer: 8 mL/min — ABNORMAL LOW (ref >60)
Glucose, Bld: 116 mg/dL — ABNORMAL HIGH (ref 65–99)
Phosphorus: 4 mg/dL (ref 2.5–4.6)
Potassium: 4.8 mmol/L (ref 3.5–5.1)
Sodium: 139 mmol/L (ref 135–145)

## 2015-02-02 LAB — HEMOGLOBIN A1C
Hgb A1c MFr Bld: 5.9 % — ABNORMAL HIGH (ref 4.8–5.6)
MEAN PLASMA GLUCOSE: 123 mg/dL

## 2015-02-02 LAB — CBC
HCT: 25.3 % — ABNORMAL LOW (ref 39.0–52.0)
Hemoglobin: 8.1 g/dL — ABNORMAL LOW (ref 13.0–17.0)
MCH: 26.6 pg (ref 26.0–34.0)
MCHC: 32 g/dL (ref 30.0–36.0)
MCV: 83 fL (ref 78.0–100.0)
Platelets: 225 10*3/uL (ref 150–400)
RBC: 3.05 MIL/uL — ABNORMAL LOW (ref 4.22–5.81)
RDW: 17.1 % — ABNORMAL HIGH (ref 11.5–15.5)
WBC: 6.2 10*3/uL (ref 4.0–10.5)

## 2015-02-02 LAB — GLUCOSE, CAPILLARY
GLUCOSE-CAPILLARY: 130 mg/dL — AB (ref 65–99)
Glucose-Capillary: 113 mg/dL — ABNORMAL HIGH (ref 65–99)

## 2015-02-02 LAB — PROTIME-INR
INR: 1.28 (ref 0.00–1.49)
Prothrombin Time: 16.1 seconds — ABNORMAL HIGH (ref 11.6–15.2)

## 2015-02-02 MED ORDER — HEPARIN (PORCINE) IN NACL 100-0.45 UNIT/ML-% IJ SOLN
1500.0000 [IU]/h | INTRAMUSCULAR | Status: DC
  Administered 2015-02-02: 1200 [IU]/h via INTRAVENOUS
  Filled 2015-02-02 (×3): qty 250

## 2015-02-02 MED ORDER — SODIUM CHLORIDE 0.9 % IV SOLN: 100.0000 mL | INTRAVENOUS | Status: DC | PRN

## 2015-02-02 MED ORDER — KETOROLAC TROMETHAMINE 10 MG PO TABS
10.0000 mg | ORAL_TABLET | Freq: Three times a day (TID) | ORAL | Status: DC | PRN
Start: 2015-02-02 — End: 2015-02-04
  Administered 2015-02-03 (×2): 10 mg via ORAL
  Filled 2015-02-02 (×4): qty 1

## 2015-02-02 MED ORDER — WARFARIN SODIUM 7.5 MG PO TABS
7.5000 mg | ORAL_TABLET | Freq: Once | ORAL | Status: AC
  Administered 2015-02-02: 7.5 mg via ORAL
  Filled 2015-02-02: qty 1

## 2015-02-02 MED ORDER — HEPARIN SODIUM (PORCINE) 1000 UNIT/ML DIALYSIS: 20.0000 [IU]/kg | INTRAMUSCULAR | Status: DC | PRN

## 2015-02-02 MED ORDER — ZOLPIDEM TARTRATE 5 MG PO TABS: 5.0000 mg | ORAL_TABLET | Freq: Every evening | ORAL | Status: DC | PRN

## 2015-02-02 MED ORDER — HEPARIN SODIUM (PORCINE) 1000 UNIT/ML DIALYSIS: 1000.0000 [IU] | INTRAMUSCULAR | Status: DC | PRN

## 2015-02-02 MED ORDER — ALTEPLASE 2 MG IJ SOLR
2.0000 mg | Freq: Once | INTRAMUSCULAR | Status: DC | PRN
  Filled 2015-02-02: qty 2

## 2015-02-02 MED ORDER — LIDOCAINE-PRILOCAINE 2.5-2.5 % EX CREA: 1.0000 "application " | TOPICAL_CREAM | CUTANEOUS | Status: DC | PRN

## 2015-02-02 MED ORDER — DARBEPOETIN ALFA 100 MCG/0.5ML IJ SOSY
100.0000 ug | PREFILLED_SYRINGE | INTRAMUSCULAR | Status: DC
  Administered 2015-02-02: 100 ug via INTRAVENOUS
  Filled 2015-02-02: qty 0.5

## 2015-02-02 MED ORDER — NEPRO/CARBSTEADY PO LIQD
237.0000 mL | ORAL | Status: DC | PRN
  Filled 2015-02-02: qty 237

## 2015-02-02 MED ORDER — HYDRALAZINE HCL 50 MG PO TABS
50.0000 mg | ORAL_TABLET | Freq: Three times a day (TID) | ORAL | Status: DC
  Administered 2015-02-02 – 2015-02-04 (×6): 50 mg via ORAL
  Filled 2015-02-02 (×9): qty 1

## 2015-02-02 MED ORDER — LIDOCAINE HCL (PF) 1 % IJ SOLN: 5.0000 mL | INTRAMUSCULAR | Status: DC | PRN

## 2015-02-02 MED ORDER — DARBEPOETIN ALFA 100 MCG/0.5ML IJ SOSY
PREFILLED_SYRINGE | INTRAMUSCULAR | Status: AC
  Filled 2015-02-02: qty 0.5

## 2015-02-02 MED ORDER — PENTAFLUOROPROP-TETRAFLUOROETH EX AERO: 1.0000 "application " | INHALATION_SPRAY | CUTANEOUS | Status: DC | PRN

## 2015-02-02 NOTE — Progress Notes (Signed)
Frank [name] Progress Note  Assessment/Plan: 1. Dyspnea/ chest pain - CXR negative, w/u per primary trop 0.6, 0.7- same as prior labs- breathing better, still some CP not as bad. 2. SOB/ edema LE's / vol excess - sats ok, lungs clear - resolved 3. ESRD TTS [facility] / failed renal tx lasted 8 yrs, 3 yrs back on HD- wants to transfer to [facility], but tells me they won't accept without an access (using perm cath)-PD cath - was trying to get back on PD this year but has not worked out; HD again today short treatment to get back on schedule - needs slightly lower EDW for d/c - profile 4 4. Anemia Hb 9.3 down to 8.7 , got one unit this past weekend at OP HD on Sat Hgb 8.7 7/13  I checked with his HD unit, he is NOT getting ESAs since April due to a possible 2 cm lesion on his kidney (not mentioned on abdominal CT 10/2014 here) and not getting IV Fe due to allergy. Plan Aranesp 100 with next HD- 5. HTN uncontrolled on coreg/ prinivil/ hydral - at one point on 100 hydralazine now 25 tid - will ^ to 50 tid plus lower EDW slightly for d/c- discussed with pt;  Net UF 2.8 7/12; volume not recorded yesterday - looks like 4 L per weights;  post EDW weight was 77 = 169# which according to his outpt HD unit, he has been getting to 6. Chronic pain - ? If some symptoms related to being "out of pain meds" for several days PTA/drug seeking behavior - see primary note; advised if ever accepted into [facility] practice, we do not Rx pain medicines - this must be done by PCP 7. Hx DVT right upper chest - titrating INR, subtherapeutic on admission; per HD RN he was supposed to be getting INRs checked at the [facility] clinic. 8. ^ LFT - probably due to liver congestions - coming down with volume removal  Frank Jacobson, PA-C [place] [phone] 02/02/2015,10:47 AM  LOS: 2 days   Pt seen, examined and agree w A/P as above.  Kelly Splinter MD pager [phone]    cell  [phone] 02/02/2015, 11:37 AM    Subjective:   Feels better; no cramping - states HD RN said he should be on a profile; still thinks he needs some pain meds for chest pain  Objective Filed Vitals:   02/01/15 2020 02/02/15 0436 02/02/15 0521 02/02/15 0824  BP: 161/124  150/112 159/100  Pulse: 90  78 75  Temp: 98.4 F (36.9 C)  97.6 F (36.4 C) 97.7 F (36.5 C)  TempSrc: Oral  Oral Oral  Resp: _0 Height:      Weight:  77 kg (169 lb 12.1 oz)    SpO2: 97%  99% 98%   Physical Exam General: NAD, looks good Heart: RRR Lungs: no rales Abdomen: soft NT Extremities: tr LE edema on the right Dialysis Access: right IJ  Dialysis Orders: [facility] on [address] 4h 170 lbs- gets to EDW 2/2.5 bath ; not on ESA or Fe- see above  Additional Objective Labs: Lab Results  Component Value Date   INR 1.28 02/01/2015   INR 1.25 01/31/2015   INR 1.54* 09/64/3838    Basic Metabolic Panel:  Recent Labs Lab 01/31/15 0046 01/31/15 1433 01/31/15 1444 02/01/15 0139  NA 137 136 135 135  K 5.1 6.7* 6.5* 4.1  CL 102  101 104 98*  CO2 22 21*  --  28  GLUCOSE 94 67 64* 105*  BUN 55* 66* 64* 36*  CREATININE 10.65* 12.07* 12.10* 7.93*  CALCIUM 8.5* 8.3*  --  7.9*   Liver Function Tests:  Recent Labs Lab 01/31/15 0150 02/01/15 0139  AST 137* 119*  ALT 72* 58  ALKPHOS 125 114  BILITOT 0.4 0.5  PROT 7.6 6.6  ALBUMIN 2.3* 2.0*    Recent Labs Lab 01/31/15 0150  LIPASE 45   CBC:  Recent Labs Lab 01/31/15 0046 01/31/15 1433 01/31/15 1444 02/01/15 0139  WBC 7.4 5.8  --  5.1  HGB 9.2* 9.3* 10.5* 8.7*  HCT 28.9* 28.6* 31.0* 27.1*  MCV 83.5 83.9  --  81.6  PLT 299 264  --  261   Cardiac Enzymes:  Recent Labs Lab 02/01/15 0139 02/01/15 1852  TROPONINI 0.07* 0.05*   CBG:  Recent Labs Lab 01/31/15 2113 01/31/15 2344 02/01/15 2114 02/02/15 0758  GLUCAP 91 128* 161* 113*  No results found. Medications:   . atorvastatin  40 mg Oral q1800   . carvedilol  25 mg Oral BID WC  . cinacalcet  30 mg Oral Q breakfast  . diclofenac sodium  2 g Topical QID  . dicyclomine  20 mg Oral BID  . docusate sodium  100 mg Oral BID  . doxercalciferol  2.5 mcg Intravenous Q T,Th,Sa-HD  . folic acid  1 mg Oral Daily  . hydrALAZINE  25 mg Oral 3 times per day  . hydrocerin   Topical BID  . insulin aspart  0-9 Units Subcutaneous TID WC  . lisinopril  20 mg Oral Daily  . multivitamin with minerals  1 tablet Oral Daily  . pantoprazole  40 mg Oral Daily  . predniSONE  5 mg Oral Q breakfast  . sodium chloride  3 mL Intravenous Q12H  . sodium chloride  3 mL Intravenous Q12H  . thiamine  100 mg Oral Daily  . Warfarin - Pharmacist Dosing Inpatient   Does not apply [phone]

## 2015-02-02 NOTE — Care Management Important Message (Signed)
Important Message  Patient Details  Name: Frank [name] MRN: [id number] Date of Birth: [birth year]   Medicare Important Message Given:  Yes-second notification given    Delorse Lek 02/02/2015, 1:57 PM

## 2015-02-02 NOTE — Progress Notes (Signed)
ANTICOAGULATION CONSULT NOTE - Follow Up Consult  Pharmacy Consult for Coumadin Indication: hx DVT and PE   Patient Measurements: Height: 6' 1.5" (186.7 cm) Weight: 169 lb 12.1 oz (77 kg) IBW/kg (Calculated) : 81.05  Vital Signs: Temp: 97.7 F (36.5 C) (07/14 0824) Temp Source: Oral (07/14 0824) BP: 159/100 mmHg (07/14 0824) Pulse Rate: 75 (07/14 0824)  Labs:  Recent Labs  01/31/15 0046 01/31/15 0200 01/31/15 1433 01/31/15 1444 02/01/15 0139 02/01/15 1852 02/02/15 1026  HGB 9.2*  --  9.3* 10.5* 8.7*  --   --   HCT 28.9*  --  28.6* 31.0* 27.1*  --   --   PLT 299  --  264  --  261  --   --   LABPROT  --  15.9*  --   --  16.1*  --  16.1*  INR  --  1.25  --   --  1.28  --  1.28  CREATININE 10.65*  --  12.07* 12.10* 7.93*  --   --   TROPONINI  --   --   --   --  0.07* 0.05*  --    ESRD  Assessment:   Hx right IJ and mixed acute and chronic DVT or right brachial vein  11/30/14 and hx PE 04/2014   INR remains subtherapeutic (1.28) after Coumadin 6 mg daily x 2 days. No change from yesterday.   Home Coumadin regimen 4 mg daily, but not taking consistently prior to admission.  Goal of Therapy:  INR 2-3 Monitor platelets by anticoagulation protocol: Yes   Plan:   Coumadin 7.5 mg x 1 today.  Daily PT/INR.  Arty Baumgartner, [place] Pager: [phone] 02/02/2015,11:25 AM

## 2015-02-02 NOTE — Progress Notes (Signed)
ANTICOAGULATION CONSULT NOTE - Initial Consult  Pharmacy Consult for Heparin Indication: Hx recent DVT (11/2014) and prior PE (04/2014)  Allergies  Allergen Reactions  . Ferrlecit [Na Ferric Gluc Cplx In Sucrose] Shortness Of Breath, Swelling and Other (See Comments)    Swelling in throat  . Darvocet [Propoxyphene N-Acetaminophen] Hives    Patient Measurements: Height: 6' 1.5" (186.7 cm) Weight: 168 lb 3.4 oz (76.3 kg) IBW/kg (Calculated) : 81.05 Heparin Dosing Weight: 76.3 kg  Vital Signs: Temp: 98 F (36.7 C) (07/14 1555) Temp Source: Oral (07/14 1555) BP: 153/105 mmHg (07/14 1555) Pulse Rate: 75 (07/14 1555)  Labs:  Recent Labs  01/31/15 0200 01/31/15 1433 01/31/15 1444 02/01/15 0139 02/01/15 1852 02/02/15 1026 02/02/15 1300  HGB  --  9.3* 10.5* 8.7*  --   --  8.1*  HCT  --  28.6* 31.0* 27.1*  --   --  25.3*  PLT  --  264  --  261  --   --  225  LABPROT 15.9*  --   --  16.1*  --  16.1*  --   INR 1.25  --   --  1.28  --  1.28  --   CREATININE  --  12.07* 12.10* 7.93*  --   --  6.86*  TROPONINI  --   --   --  0.07* 0.05*  --   --     Estimated Creatinine Clearance: 13.9 mL/min (by C-G formula based on Cr of 6.86).   Medical History: Past Medical History  Diagnosis Date  . Hypertension   . Depression   . Complication of anesthesia     itching, sore throat  . Diabetes mellitus without complication     No history per patient, but remains under history as A1c would not be accurate given on dialysis  . Shortness of breath   . Anxiety   . ESRD (end stage renal disease)     due to HTN per patient, followed at [facility], s/p failed kidney transplant - dialysis Tue, Th, Sat  . Renal insufficiency   . CHF (congestive heart failure)   . Anemia   . Dialysis patient     Assessment: 2 YOM with recent DVT in May '16 and prior PE in Nov '15 on warfarin for anticoagulation with a SUBtherapeutic INR (1.28 << 1.28, goal of 2-3). Pharmacy asked to bridge with heparin  while awaiting INR rise to goal range. CBC low but stable - no overt bleeding noted.   Goal of Therapy:  Heparin level 0.3-0.7 units/ml Monitor platelets by anticoagulation protocol: Yes   Plan:  1. Start heparin at a rate of 1200 units/hr (12 ml/hr) 2. Daily heparin levels 3. Will continue to monitor for any signs/symptoms of bleeding and will follow up with heparin level in 8 hours   Alycia Rossetti, PharmD, BCPS Clinical Pharmacist Pager: [phone] 02/02/2015 5:26 PM

## 2015-02-02 NOTE — Progress Notes (Signed)
Pt in HD and alert. Argued about not having lunch in HD unit, length of HD treatment and goal for treatment. I told him I would notify MD about his concerns.

## 2015-02-02 NOTE — Progress Notes (Signed)
PROGRESS NOTE  Frank [name] TWS:[id number] DOB: [birth year] DOA: 01/31/2015 PCP: Angelica Chessman, MD  HPI/Recap of past 61 hours: 49 year old male past medical history of chronic systolic/diastolic heart failure, medical noncompliance, recent right upper extremity DVT and end-stage renal disease on hemodialysis admitted on 7/12 or chest pain and signs of volume overload. Patient has a previous history of being noncompliant with his dialysis. He was diagnosed with a right upper extremity DVT in May and restarted on his Coumadin, which she had been noncompliant with for a lower extremity DVT diagnosed last October.on admission, labs consistent with end-stage renal disease, INR of 1.28.  Patient admitted to hospitalist service and given dialysis that evening.  Enzymes cycled and found to be unrevealing.  Today, patient is doing okay. Complains of chronic right sided neck pain plus chest pain left of midsternal, sharp and tender   Assessment/Plan: Principal Problem:   Left upper extremity deep vein thrombosis: Principal problem. Discussed this case with nephrology. Restarted on Coumadin. Once INR therapeutic, will discharge. Given noncompliance, need to set endpoint for Coumadin, at the end of October Active Problems:   Chronic combined systolic and diastolic CHF (congestive heart failure)c: managed by dialysis.   Chest pain: Enzymes cycled. Negative. Musculoskeletal, maybe costochondritis. Also could be for secondary gain. Had discontinued narcotics and will try anti-inflammatory    DM (diabetes mellitus), type 2, uncontrolled, with renal complications: Stable blood sugars, change sliding scale to 3 times a day     Hyperkalemia: secondary to end-stage renal disease and missing dialysis. Resolved.    Accelerated hypertension: suspect patient noncompliant at home with medications. Have resume these.    History of noncompliance with medical treatment: See below   Volume overload:   Drug-seeking  behavior: I suspect that this is the underlying issue. Following patient's hospitalization in May, he was seen in the emergency room at [facility] and then 3 more times in the emergency room in this health system. He is often noncompliant and comes in complaining of headache or chest pain. He requested prescriptions for OxyContin and Ativan from me. I explained to him that his primary care physician will be the only physician who we'll be giving him these prescriptions from here on out. We will need to develop a care plan so that unless there is objective evidence for issues, he not receive narcotics either in the emergency room or when hospitalized.     Code Status: full code  Family Communication: no family present  Disposition Plan: discharge once INR therapeutic   Consultants:  nephrology  Procedures:  hemodialysis  Antibiotics:  none   Objective: BP 128/80 mmHg  Pulse 69  Temp(Src) 98 F (36.7 C) (Oral)  Resp 14  Ht 6' 1.5" (1.867 m)  Wt 78.8 kg (173 lb 11.6 oz)  BMI 22.61 kg/m2  SpO2 97%  Intake/Output Summary (Last 24 hours) at 02/02/15 1442 Last data filed at 02/02/15 0739  Gross per 24 hour  Intake   1080 ml  Output    500 ml  Net    580 ml   Filed Weights   02/01/15 1821 02/02/15 0436 02/02/15 1250  Weight: 77 kg (169 lb 12.1 oz) 77 kg (169 lb 12.1 oz) 78.8 kg (173 lb 11.6 oz)    Exam:  unchanged from previous day   General:  Alert and oriented 3, no acute distress  Cardiovascular: regular rate and rhythm, G0-F7, 2/6 systolic ejection murmur  Respiratory: clear to auscultation bilaterally  Abdomen: soft, nontender, nondistended,  positive bowel sounds  Musculoskeletal: no clubbing or cyanosis, trace edema   Data Reviewed: Basic Metabolic Panel:  Recent Labs Lab 01/31/15 0046 01/31/15 1433 01/31/15 1444 02/01/15 0139 02/02/15 1300  NA 137 136 135 135 139  K 5.1 6.7* 6.5* 4.1 4.8  CL 102 101 104 98* 106  CO2 22 21*  --  28 26    GLUCOSE 94 67 64* 105* 116*  BUN 55* 66* 64* 36* 32*  CREATININE 10.65* 12.07* 12.10* 7.93* 6.86*  CALCIUM 8.5* 8.3*  --  7.9* 7.2*  PHOS  --   --   --   --  4.0   Liver Function Tests:  Recent Labs Lab 01/31/15 0150 02/01/15 0139 02/02/15 1300  AST 137* 119*  --   ALT 72* 58  --   ALKPHOS 125 114  --   BILITOT 0.4 0.5  --   PROT 7.6 6.6  --   ALBUMIN 2.3* 2.0* 1.7*    Recent Labs Lab 01/31/15 0150  LIPASE 45   No results for input(s): AMMONIA in the last 168 hours. CBC:  Recent Labs Lab 01/31/15 0046 01/31/15 1433 01/31/15 1444 02/01/15 0139 02/02/15 1300  WBC 7.4 5.8  --  5.1 6.2  HGB 9.2* 9.3* 10.5* 8.7* 8.1*  HCT 28.9* 28.6* 31.0* 27.1* 25.3*  MCV 83.5 83.9  --  81.6 83.0  PLT 299 264  --  261 225   Cardiac Enzymes:    Recent Labs Lab 02/01/15 0139 02/01/15 1852  TROPONINI 0.07* 0.05*   BNP (last 3 results)  Recent Labs  11/26/14 2352 01/09/15 1529 01/26/15 0421  BNP >4500.0* 3606.1* >4500.0*    ProBNP (last 3 results)  Recent Labs  04/15/14 0809  PROBNP >70000.0*    CBG:  Recent Labs Lab 01/31/15 2113 01/31/15 2344 02/01/15 2114 02/02/15 0758  GLUCAP 91 128* 161* 113*    No results found for this or any previous visit (from the past 240 hour(s)).   Studies: No results found.  Scheduled Meds: . atorvastatin  40 mg Oral q1800  . carvedilol  25 mg Oral BID WC  . cinacalcet  30 mg Oral Q breakfast  . darbepoetin (ARANESP) injection - DIALYSIS  100 mcg Intravenous Q Thu-HD  . diclofenac sodium  2 g Topical QID  . dicyclomine  20 mg Oral BID  . docusate sodium  100 mg Oral BID  . doxercalciferol  2.5 mcg Intravenous Q T,Th,Sa-HD  . folic acid  1 mg Oral Daily  . hydrALAZINE  50 mg Oral 3 times per day  . hydrocerin   Topical BID  . insulin aspart  0-9 Units Subcutaneous TID WC  . lisinopril  20 mg Oral Daily  . multivitamin with minerals  1 tablet Oral Daily  . pantoprazole  40 mg Oral Daily  . predniSONE  5 mg Oral  Q breakfast  . sodium chloride  3 mL Intravenous Q12H  . sodium chloride  3 mL Intravenous Q12H  . thiamine  100 mg Oral Daily  . warfarin  7.5 mg Oral ONCE-1800  . Warfarin - Pharmacist Dosing Inpatient   Does not apply q1800    Continuous Infusions:    Time spent: 15 minutes  [place] [facility] Pager [phone]. If 7PM-7AM, please contact night-coverage at [email or web address], password [facility] 02/02/2015, 2:42 PM  LOS: 2 days

## 2015-02-03 DIAGNOSIS — Z9119 Patient's noncompliance with other medical treatment and regimen: Secondary | ICD-10-CM

## 2015-02-03 DIAGNOSIS — E440 Moderate protein-calorie malnutrition: Secondary | ICD-10-CM | POA: Insufficient documentation

## 2015-02-03 LAB — GLUCOSE, CAPILLARY
GLUCOSE-CAPILLARY: 119 mg/dL — AB (ref 65–99)
GLUCOSE-CAPILLARY: 115 mg/dL — AB (ref 65–99)
GLUCOSE-CAPILLARY: 143 mg/dL — AB (ref 65–99)
Glucose-Capillary: 136 mg/dL — ABNORMAL HIGH (ref 65–99)
Glucose-Capillary: 97 mg/dL (ref 65–99)
Glucose-Capillary: 138 mg/dL — ABNORMAL HIGH (ref 65–99)

## 2015-02-03 LAB — HEPARIN LEVEL (UNFRACTIONATED)
Heparin Unfractionated: <0.1 IU/mL — ABNORMAL LOW (ref 0.30–0.70)
Heparin Unfractionated: <0.1 IU/mL — ABNORMAL LOW (ref 0.30–0.70)
Heparin Unfractionated: 0.15 IU/mL — ABNORMAL LOW (ref 0.30–0.70)

## 2015-02-03 LAB — PROTIME-INR
INR: 1.52 — AB (ref 0.00–1.49)
Prothrombin Time: 18.3 seconds — ABNORMAL HIGH (ref 11.6–15.2)

## 2015-02-03 MED ORDER — WARFARIN SODIUM 5 MG PO TABS
5.0000 mg | ORAL_TABLET | Freq: Once | ORAL | Status: AC
  Administered 2015-02-03: 5 mg via ORAL
  Filled 2015-02-03: qty 1

## 2015-02-03 MED ORDER — NEPRO/CARBSTEADY PO LIQD
237.0000 mL | Freq: Two times a day (BID) | ORAL | Status: DC
  Administered 2015-02-04: 237 mL via ORAL

## 2015-02-03 MED ORDER — WARFARIN SODIUM 6 MG PO TABS
6.0000 mg | ORAL_TABLET | Freq: Once | ORAL | Status: DC
  Filled 2015-02-03: qty 1

## 2015-02-03 MED ORDER — HEPARIN BOLUS VIA INFUSION
2000.0000 [IU] | INTRAVENOUS | Status: AC
  Administered 2015-02-03: 2000 [IU] via INTRAVENOUS
  Filled 2015-02-03: qty 2000

## 2015-02-03 MED ORDER — HEPARIN (PORCINE) IN NACL 100-0.45 UNIT/ML-% IJ SOLN
2050.0000 [IU]/h | INTRAMUSCULAR | Status: DC
  Administered 2015-02-03: 1700 [IU]/h via INTRAVENOUS
  Administered 2015-02-04: 1900 [IU]/h via INTRAVENOUS
  Filled 2015-02-03 (×2): qty 250

## 2015-02-03 MED ORDER — RENA-VITE PO TABS
1.0000 | ORAL_TABLET | Freq: Every day | ORAL | Status: DC
  Administered 2015-02-03: 1 via ORAL
  Filled 2015-02-03 (×2): qty 1

## 2015-02-03 NOTE — Progress Notes (Addendum)
ANTICOAGULATION CONSULT NOTE - Initial Consult  Pharmacy Consult for Heparin Indication: Hx recent DVT (11/2014) and prior PE (04/2014)  Allergies  Allergen Reactions  . Ferrlecit [Na Ferric Gluc Cplx In Sucrose] Shortness Of Breath, Swelling and Other (See Comments)    Swelling in throat  . Darvocet [Propoxyphene N-Acetaminophen] Hives    Patient Measurements: Height: 6' 1.5" (186.7 cm) Weight: 168 lb 3.4 oz (76.3 kg) IBW/kg (Calculated) : 81.05 Heparin Dosing Weight: 76.3 kg  Vital Signs: Temp: 98.4 F (36.9 C) (07/15 1000) Temp Source: Oral (07/15 1000) BP: 145/98 mmHg (07/15 1000) Pulse Rate: 76 (07/15 1000)  Labs:  Recent Labs  02/01/15 0139 02/01/15 1852 02/02/15 1026 02/02/15 1300 02/03/15 0356 02/03/15 1400  HGB 8.7*  --   --  8.1*  --   --   HCT 27.1*  --   --  25.3*  --   --   PLT 261  --   --  225  --   --   LABPROT 16.1*  --  16.1*  --  18.3*  --   INR 1.28  --  1.28  --  1.52*  --   HEPARINUNFRC  --   --   --   --  <0.10* <0.10*  CREATININE 7.93*  --   --  6.86*  --   --   TROPONINI 0.07* 0.05*  --   --   --   --     Estimated Creatinine Clearance: 13.9 mL/min (by C-G formula based on Cr of 6.86).   Medical History: Past Medical History  Diagnosis Date  . Hypertension   . Depression   . Complication of anesthesia     itching, sore throat  . Diabetes mellitus without complication     No history per patient, but remains under history as A1c would not be accurate given on dialysis  . Shortness of breath   . Anxiety   . ESRD (end stage renal disease)     due to HTN per patient, followed at [facility], s/p failed kidney transplant - dialysis Tue, Th, Sat  . Renal insufficiency   . CHF (congestive heart failure)   . Anemia   . Dialysis patient     Assessment: 72 YOM with recent DVT in May '16 and prior PE in Nov '15 on warfarin for anticoagulationj. Admitted with a SUBtherapeutic INR (1.28 << 1.28, goal of 2-3). H/o of noncompliance with  medications. Currently on bridge with IV heparin while awaiting INR rise to goal range. INR 1.52 up from 1.28. CBC low  -Hgb 8.1 <8.7<10.5<9.2 Pltc 225K<261K. No bleeding noted.   Internal medicine notes plan for coumadin is to set endpoint for Coumadin at the end of October 2016.   Goal of Therapy:  Heparin level 0.3-0.7 units/ml Monitor platelets by anticoagulation protocol: Yes   Plan:  1. Give heparin 2000 units IV bolus x1 2. Increase heparin  Rate to1700 units/hr (17 ml/hr) 3. Check heparin level in 6 hours 4. Daily heparin levels, PT/INR and CBC. 5. Coumadin 5 mg po today x1 5. Will continue to monitor for any signs/symptoms of bleeding and will follow up with heparin level in 8 hours   Nicole Cella, RPh Clinical Pharmacist Pager: [phone] 02/03/2015 3:51 PM

## 2015-02-03 NOTE — Progress Notes (Signed)
Initial Nutrition Assessment  DOCUMENTATION CODES:   Non-severe (moderate) malnutrition in context of chronic illness  INTERVENTION:   Provide Nepro Shake po BID, each supplement provides 425 kcal and 19 grams protein  Encourage adequate PO intake.  NUTRITION DIAGNOSIS:   Malnutrition related to chronic illness as evidenced by estimated needs.  GOAL:   Patient will meet greater than or equal to 90% of their needs  MONITOR:   PO intake, Supplement acceptance, Weight trends, Labs, I & O's  REASON FOR ASSESSMENT:   Consult  (Protein calorie malnutrition)  ASSESSMENT:   Pt with history of ESRD on HD DM HTN CHF presented to the ED with chest pain, volume overload.  Pt reports having a good appetite currently and PTA with no other difficulties. Meal completion has been 100%. Usual body weight reported to be ~170 lbs. Noted pt with moderate fat and muscle mass loss. Pt is agreeable to Nepro to aid in caloric and protein needs. Pt additionally requested information on how to increase protein intake. Pt was educated on nutritional supplements and foods high in protein.   Nutrition-Focused physical exam completed. Findings are moderate fat depletion, moderate muscle depletion, and moderate edema.   Labs: Low calcium and GFR. High BUN and creatinine.   Diet Order:  Diet renal/carb modified with fluid restriction Diet-HS Snack?: Nothing; Room service appropriate?: Yes; Fluid consistency:: Thin  Skin:   (+2 LE edema)  Last BM:  7/14  Height:   Ht Readings from Last 1 Encounters:  01/31/15 6' 1.5" (1.867 m)    Weight:   Wt Readings from Last 1 Encounters:  02/03/15 168 lb 3.4 oz (76.3 kg)    Ideal Body Weight:  85 kg  Wt Readings from Last 10 Encounters:  02/03/15 168 lb 3.4 oz (76.3 kg)  01/31/15 180 lb 1 oz (81.676 kg)  01/03/15 178 lb 5.6 oz (80.9 kg)  12/29/14 181 lb 4.8 oz (82.237 kg)  12/26/14 173 lb 8 oz (78.699 kg)  12/23/14 165 lb 5.5 oz (75 kg)  12/20/14  161 lb 9.6 oz (73.3 kg)  12/16/14 172 lb 2.9 oz (78.1 kg)  12/14/14 192 lb (87.091 kg)  12/12/14 185 lb 6.5 oz (84.1 kg)    BMI:  Body mass index is 21.89 kg/(m^2).  Estimated Nutritional Needs:   Kcal:  2100-2300  Protein:  100-115 grams  Fluid:  1.2 L/day  EDUCATION NEEDS:   Education needs addressed  Corrin Parker, MS, RD, LDN Pager # [phone] After hours/ weekend pager # [phone]

## 2015-02-03 NOTE — Progress Notes (Addendum)
ANTICOAGULATION CONSULT NOTE - Follow-up Consult  Pharmacy Consult for Heparin Indication: Hx recent DVT (11/2014) and prior PE (04/2014)  Allergies  Allergen Reactions  . Ferrlecit [Na Ferric Gluc Cplx In Sucrose] Shortness Of Breath, Swelling and Other (See Comments)    Swelling in throat  . Darvocet [Propoxyphene N-Acetaminophen] Hives    Patient Measurements: Height: 6' 1.5" (186.7 cm) Weight: 168 lb 10.4 oz (76.5 kg) IBW/kg (Calculated) : 81.05 Heparin Dosing Weight: 76.3 kg  Vital Signs: Temp: 97.6 F (36.4 C) (07/15 2115) Temp Source: Oral (07/15 2115) BP: 152/96 mmHg (07/15 2115) Pulse Rate: 73 (07/15 2115)  Labs:  Recent Labs  02/01/15 0139 02/01/15 1852 02/02/15 1026 02/02/15 1300 02/03/15 0356 02/03/15 1400 02/03/15 2318  HGB 8.7*  --   --  8.1*  --   --   --   HCT 27.1*  --   --  25.3*  --   --   --   PLT 261  --   --  225  --   --   --   LABPROT 16.1*  --  16.1*  --  18.3*  --   --   INR 1.28  --  1.28  --  1.52*  --   --   HEPARINUNFRC  --   --   --   --  <0.10* <0.10* 0.15*  CREATININE 7.93*  --   --  6.86*  --   --   --   TROPONINI 0.07* 0.05*  --   --   --   --   --     Estimated Creatinine Clearance: 13.9 mL/min (by C-G formula based on Cr of 6.86).  Assessment: 79 YOM with recent DVT in May '16 and prior PE in Nov '15 on warfarin for anticoagulation. Admitted with a SUBtherapeutic INR (1.28, goal of 2-3). H/o of noncompliance with medications. Currently on bridge with IV heparin while awaiting INR rise to goal range. INR up to 1.52. Heparin level 0.15 (subtherapeutic) on 1700 units/hr. No issues with line or bleeding per RN.  Goal of Therapy:  Heparin level 0.3-0.7 units/ml Monitor platelets by anticoagulation protocol: Yes   Plan:  Increase heparin to 1900 units/hr  Check heparin level in 8 hours  Sherlon Handing, PharmD, BCPS Clinical pharmacist, pager [phone] 02/03/2015 11:59 PM      Addendum  -HL subtherapeutic, increase heparin gtt  to 2050 units/hr -INR is up to 2 this morning, although therapeutic, it is on the low end, due to history of PE and DVT, I would continue the bridge for another 24 hours. If patient is ready for discharge, we could give him one dose of Lovenox and that would carry him through until tomorrow when INR will surely be therapeutic -Warfarin 5 mg po x1 tonight -Pt was previously on warfarin 4 mg po daily but this dose does not appear to be enough for him -Daily HL, CBC, INR, recheck HL in 8 hours    Harvel Quale  02/04/2015 9:12 AM

## 2015-02-03 NOTE — Progress Notes (Addendum)
Pt requested to be temporarily unhooked from IV pole so he could ambulate in the hallway. I explained to patient importance of continuous heparin infusion, pt stated he would remove IV himself if I did not unhook him. I told pt I would unhook him for 10 minutes so that he could ambulate up and down this hallway only. Pt was seen leaving floor by nurse secretary. Pt stated to her that this RN had given him permission, which I did not. Pt also unplugged telemetry box without my knowledge. MD and security notified.

## 2015-02-03 NOTE — Progress Notes (Signed)
[facility] Progress Note  Assessment/Plan: 1. Dyspnea/ chest pain - CXR negative, w/u per primary trop 0.6, 0.7- same as prior labs- Denies SOB, still has chest wall pain. Troponin  peak 0.07. O2 sats 97-99%. 2. SOB/ edema LE's / vol excess - sats ok, lungs clear - resolved 3. ESRD TTS [facility] / failed renal tx lasted 8 yrs, 3 yrs back on HD- wants to transfer to [facility], but tells me they won't accept without an access (using perm cath)-PD cath - was trying to get back on PD this year but has not worked out; Will write orders for HD saturday - profile 4 4. Anemia Hb 9.3 down to 8.7 , got one unit this past weekend at OP HD on Sat Hgb 8.7 7/13 I checked with his HD unit, he is NOT getting ESAs since April due to a possible 2 cm lesion on his kidney (not mentioned on abdominal CT 10/2014 here) and not getting IV Fe due to allergy. Started ESA therapy 02/02/15. Will recheck in HD tomorrow.  5. 5. HTN uncontrolled on coreg/ prinivil/ hydral - at one point on 100 hydralazine now 25 tid - will ^ to 50 tid plus lower EDW slightly for d/c- Had HD 02/02/15 net UF 2500. Post wt. 76.3 BP 126/95-159/100.  No edema. Will attempt 1-2 liters.  6. Chronic pain - ? If some symptoms related to being "out of pain meds" for several days PTA/drug seeking behavior - see primary note; advised if ever accepted into [facility] practice, we do not Rx pain medicines - this must be done by PCP.  Only compliant is that he has chronic pain and wants pain medication.  7. Hx DVT right upper chest - titrating INR, subtherapeutic on admission; per HD RN he was supposed to be getting INRs checked at [facility]. Currently on heparin drip. INR sub-therapeutic. Being managed per pharmacy.  8. ^ LFT - probably due to liver congestions - coming down with volume removal.  9. Nutrition:  Albumin 1.7. Renal diet/renal vit. Dietary consult for protein malnutrition.   Rita H. Brown NP-C 02/03/2015, 8:05 AM   [facility] [phone]  Subjective:  "I need more pain medicine-the stuff they give me doesn't work". Denies acute chest pain/SOB. States "I have chronic chest wall pain".   Objective Filed Vitals:   02/02/15 1700 02/02/15 2107 02/03/15 0147 02/03/15 0440  BP: 149/89 154/102  140/96  Pulse: 81 77  75  Temp: 97.2 F (36.2 C) 98.9 F (37.2 C)  97.5 F (36.4 C)  TempSrc: Oral Oral  Oral  Resp: _0 Height:      Weight:   76.3 kg (168 lb 3.4 oz)   SpO2: 98% 99%  99%   Physical Exam General: Well nourished AA male in NAD Heart: S1,S2, RRR No M/G/R Lungs: Bilateral breath sounds, sym. Chest excursions, CTA.  Abdomen: Abdomen soft nontender with active BS X 4 quads. [facility]ckhoff catheter RLQ, no drainage from site.  Skin: dry, scaling skin Extremities: 2+ peripheral pulses. No edema. Dialysis Access: R perm cath. Has tenckhoff catheter LLQ for PD.   Dialysis Orders: [facility] on [address] 4h 170 lbs- gets to EDW 2/2.5 bath ; not on ESA or Fe- see above   Additional Objective Labs: Basic Metabolic Panel:  Recent Labs Lab 01/31/15 1433 01/31/15 1444 02/01/15 0139 02/02/15 1300  NA 136 135 135 139  K 6.7* 6.5* 4.1 4.8  CL  101 104 98* 106  CO2 21*  --  28 26  GLUCOSE 67 64* 105* 116*  BUN 66* 64* 36* 32*  CREATININE 12.07* 12.10* 7.93* 6.86*  CALCIUM 8.3*  --  7.9* 7.2*  PHOS  --   --   --  4.0   Liver Function Tests:  Recent Labs Lab 01/31/15 0150 02/01/15 0139 02/02/15 1300  AST 137* 119*  --   ALT 72* 58  --   ALKPHOS 125 114  --   BILITOT 0.4 0.5  --   PROT 7.6 6.6  --   ALBUMIN 2.3* 2.0* 1.7*    Recent Labs Lab 01/31/15 0150  LIPASE 45   CBC:  Recent Labs Lab 01/31/15 0046 01/31/15 1433 01/31/15 1444 02/01/15 0139 02/02/15 1300  WBC 7.4 5.8  --  5.1 6.2  HGB 9.2* 9.3* 10.5* 8.7* 8.1*  HCT 28.9* 28.6* 31.0* 27.1* 25.3*  MCV 83.5 83.9  --  81.6 83.0  PLT 299 264  --  261 225   Blood Culture     Component Value Date/Time   SDES BLOOD LEFT HAND 10/07/2014 0725   SPECREQUEST BOTTLES DRAWN AEROBIC AND ANAEROBIC 5CCS 10/07/2014 0725   CULT  10/07/2014 0725    NO GROWTH 5 DAYS Performed at [facility] 10/13/2014 FINAL 10/07/2014 0725    Cardiac Enzymes:  Recent Labs Lab 02/01/15 0139 02/01/15 1852  TROPONINI 0.07* 0.05*   CBG:  Recent Labs Lab 02/02/15 0758 02/02/15 1229 02/02/15 1624 02/02/15 2131 02/03/15 0737  GLUCAP 113* 130* 136* 119* 97   Iron Studies: No results for input(s): IRON, TIBC, TRANSFERRIN, FERRITIN in the last 72 hours. _0 @ Studies/Results: No results found. Medications: . heparin 1,500 Units/hr (02/03/15 0506)   . atorvastatin  40 mg Oral q1800  . carvedilol  25 mg Oral BID WC  . cinacalcet  30 mg Oral Q breakfast  . darbepoetin (ARANESP) injection - DIALYSIS  100 mcg Intravenous Q Thu-HD  . diclofenac sodium  2 g Topical QID  . dicyclomine  20 mg Oral BID  . docusate sodium  100 mg Oral BID  . doxercalciferol  2.5 mcg Intravenous Q T,Th,Sa-HD  . folic acid  1 mg Oral Daily  . hydrALAZINE  50 mg Oral 3 times per day  . hydrocerin   Topical BID  . insulin aspart  0-9 Units Subcutaneous TID WC  . lisinopril  20 mg Oral Daily  . multivitamin with minerals  1 tablet Oral Daily  . pantoprazole  40 mg Oral Daily  . predniSONE  5 mg Oral Q breakfast  . sodium chloride  3 mL Intravenous Q12H  . sodium chloride  3 mL Intravenous Q12H  . thiamine  100 mg Oral Daily  . Warfarin - Pharmacist Dosing Inpatient   Does not apply q1800    Renal Panel: Reluctant to get AVF.  Has delusions about coming back for nephrology care in [place]. Will support with dialysis.  Marvelene Stoneberg C

## 2015-02-03 NOTE — Progress Notes (Signed)
PROGRESS NOTE  Frank [name] GNF:[id number] DOB: 08-19-1965 DOA: 01/31/2015 PCP: Angelica Chessman, MD  HPI/Recap of past 57 hours: 49 year old male past medical history of chronic systolic/diastolic heart failure, medical noncompliance, recent right upper extremity DVT and end-stage renal disease on hemodialysis admitted on 7/12 or chest pain and signs of volume overload. Patient has a previous history of being noncompliant with his dialysis. He was diagnosed with a right upper extremity DVT in May and restarted on his Coumadin, which she had been noncompliant with for a lower extremity DVT diagnosed last October.on admission, labs consistent with end-stage renal disease, INR of 1.28.  Patient admitted to hospitalist service and given dialysis that evening.  Enzymes cycled and found to be unrevealing.  Patient continues to complains of chronic right sided neck pain plus chest pain left of midsternal, sharp and tender and very reproducible on palpation and laughing/coughing  Assessment/Plan: Principal Problem:   Left upper extremity deep vein thrombosis: Principal problem. Discussed this case with nephrology. Continued on Coumadin. Once INR therapeutic, will discharge. Given noncompliance, need to set endpoint for Coumadin, at the end of October Active Problems:   Chronic combined systolic and diastolic CHF (congestive heart failure): managed by dialysis. Stable  Chest pain: Enzymes cycled. Negative. Musculoskeletal, maybe costochondritis. Also could be for secondary gain. Had discontinued narcotics and will try anti-inflammatory. Stable    DM (diabetes mellitus), type 2, uncontrolled, with renal complications: Stable blood sugars, change sliding scale to 3 times a day     Hyperkalemia: secondary to end-stage renal disease and missing dialysis. Resolved.    Accelerated hypertension: suspect patient noncompliant at home with medications. Have resumed these while in hospital    History of  noncompliance with medical treatment: See below   Volume overload:   Drug-seeking behavior: Suspect that this is the underlying issue. Following patient's hospitalization in May, he was seen in the emergency room at [facility] and then 3 more times in the emergency room in this health system. He is often noncompliant and comes in complaining of headache or chest pain. He requested prescriptions for OxyContin and Ativan from me. I explained to him that his primary care physician will be the only physician who we'll be giving him these prescriptions from here on out. We will need to develop a care plan so that unless there is objective evidence for issues, he not receive narcotics either in the emergency room or when hospitalized.    Code Status: full code  Family Communication: no family present  Disposition Plan: discharge once INR therapeutic   Consultants:  nephrology  Procedures:  hemodialysis  Antibiotics:  none   Objective: BP 145/98 mmHg  Pulse 76  Temp(Src) 98.4 F (36.9 C) (Oral)  Resp 18  Ht 6' 1.5" (1.867 m)  Wt 76.3 kg (168 lb 3.4 oz)  BMI 21.89 kg/m2  SpO2 99%  Intake/Output Summary (Last 24 hours) at 02/03/15 1454 Last data filed at 02/03/15 1300  Gross per 24 hour  Intake 2140.3 ml  Output   2500 ml  Net -359.7 ml   Filed Weights   02/02/15 1250 02/02/15 1555 02/03/15 0147  Weight: 78.8 kg (173 lb 11.6 oz) 76.3 kg (168 lb 3.4 oz) 76.3 kg (168 lb 3.4 oz)    Exam:  unchanged from previous day   General:  Alert and oriented 3, no acute distress  Cardiovascular: regular rate and rhythm, Q4-O9, 2/6 systolic ejection murmur  Respiratory: clear to auscultation bilaterally  Abdomen: soft, nontender, nondistended,  positive bowel sounds  Musculoskeletal: no clubbing or cyanosis, trace edema   Data Reviewed: Basic Metabolic Panel:  Recent Labs Lab 01/31/15 0046 01/31/15 1433 01/31/15 1444 02/01/15 0139 02/02/15 1300  NA 137 136 135 135  139  K 5.1 6.7* 6.5* 4.1 4.8  CL 102 101 104 98* 106  CO2 22 21*  --  28 26  GLUCOSE 94 67 64* 105* 116*  BUN 55* 66* 64* 36* 32*  CREATININE 10.65* 12.07* 12.10* 7.93* 6.86*  CALCIUM 8.5* 8.3*  --  7.9* 7.2*  PHOS  --   --   --   --  4.0   Liver Function Tests:  Recent Labs Lab 01/31/15 0150 02/01/15 0139 02/02/15 1300  AST 137* 119*  --   ALT 72* 58  --   ALKPHOS 125 114  --   BILITOT 0.4 0.5  --   PROT 7.6 6.6  --   ALBUMIN 2.3* 2.0* 1.7*    Recent Labs Lab 01/31/15 0150  LIPASE 45   No results for input(s): AMMONIA in the last 168 hours. CBC:  Recent Labs Lab 01/31/15 0046 01/31/15 1433 01/31/15 1444 02/01/15 0139 02/02/15 1300  WBC 7.4 5.8  --  5.1 6.2  HGB 9.2* 9.3* 10.5* 8.7* 8.1*  HCT 28.9* 28.6* 31.0* 27.1* 25.3*  MCV 83.5 83.9  --  81.6 83.0  PLT 299 264  --  261 225   Cardiac Enzymes:    Recent Labs Lab 02/01/15 0139 02/01/15 1852  TROPONINI 0.07* 0.05*   BNP (last 3 results)  Recent Labs  11/26/14 2352 01/09/15 1529 01/26/15 0421  BNP >4500.0* 3606.1* >4500.0*    ProBNP (last 3 results)  Recent Labs  04/15/14 0809  PROBNP >70000.0*    CBG:  Recent Labs Lab 02/02/15 1229 02/02/15 1624 02/02/15 2131 02/03/15 0737 02/03/15 1141  GLUCAP 130* 136* 119* 97 138*    No results found for this or any previous visit (from the past 240 hour(s)).   Studies: No results found.  Scheduled Meds: . atorvastatin  40 mg Oral q1800  . carvedilol  25 mg Oral BID WC  . cinacalcet  30 mg Oral Q breakfast  . darbepoetin (ARANESP) injection - DIALYSIS  100 mcg Intravenous Q Thu-HD  . diclofenac sodium  2 g Topical QID  . dicyclomine  20 mg Oral BID  . docusate sodium  100 mg Oral BID  . doxercalciferol  2.5 mcg Intravenous Q T,Th,Sa-HD  . feeding supplement (NEPRO CARB STEADY)  237 mL Oral BID BM  . folic acid  1 mg Oral Daily  . hydrALAZINE  50 mg Oral 3 times per day  . hydrocerin   Topical BID  . insulin aspart  0-9 Units  Subcutaneous TID WC  . lisinopril  20 mg Oral Daily  . multivitamin  1 tablet Oral QHS  . pantoprazole  40 mg Oral Daily  . predniSONE  5 mg Oral Q breakfast  . sodium chloride  3 mL Intravenous Q12H  . sodium chloride  3 mL Intravenous Q12H  . thiamine  100 mg Oral Daily  . Warfarin - Pharmacist Dosing Inpatient   Does not apply q1800    Continuous Infusions: . heparin 1,500 Units/hr (02/03/15 0506)     Time spent: 15 minutes  Madgeline Rayo, [facility] Pager [phone]. If 7PM-7AM, please contact night-coverage at [email or web address], password [facility] 02/03/2015, 2:54 PM  LOS: 3 days

## 2015-02-03 NOTE — Progress Notes (Signed)
ANTICOAGULATION CONSULT NOTE - Follow Up Consult  Pharmacy Consult for heparin Indication: h/o PE/DVT   Labs:  Recent Labs  01/31/15 1433 01/31/15 1444 02/01/15 0139 02/01/15 1852 02/02/15 1026 02/02/15 1300 02/03/15 0356  HGB 9.3* 10.5* 8.7*  --   --  8.1*  --   HCT 28.6* 31.0* 27.1*  --   --  25.3*  --   PLT 264  --  261  --   --  225  --   LABPROT  --   --  16.1*  --  16.1*  --   --   INR  --   --  1.28  --  1.28  --   --   HEPARINUNFRC  --   --   --   --   --   --  <0.10*  CREATININE 12.07* 12.10* 7.93*  --   --  6.86*  --   TROPONINI  --   --  0.07* 0.05*  --   --   --      Assessment: 48yo male undetectable on heparin with initial dosing for low INR.  Goal of Therapy:  Heparin level 0.3-0.7 units/ml   Plan:  Will increase heparin gtt by 4 units/kg/hr to 1500 units/hr and check level in [place], PharmD, BCPS  02/03/2015,5:00 AM

## 2015-02-03 NOTE — Progress Notes (Signed)
Pt now back on floor. Tele applied and CCMD notified. VSS. Pt states he did not know he wasn't allowed. Pt states he did not know he wasn't allowed to leave. Will continue to monitor.

## 2015-02-04 DIAGNOSIS — I82622 Acute embolism and thrombosis of deep veins of left upper extremity: Secondary | ICD-10-CM

## 2015-02-04 LAB — HEPARIN LEVEL (UNFRACTIONATED): Heparin Unfractionated: 0.25 IU/mL — ABNORMAL LOW (ref 0.30–0.70)

## 2015-02-04 LAB — CBC
HCT: 26 % — ABNORMAL LOW (ref 39.0–52.0)
Hemoglobin: 8.2 g/dL — ABNORMAL LOW (ref 13.0–17.0)
MCH: 26.2 pg (ref 26.0–34.0)
MCHC: 31.5 g/dL (ref 30.0–36.0)
MCV: 83.1 fL (ref 78.0–100.0)
Platelets: 222 K/uL (ref 150–400)
RBC: 3.13 MIL/uL — ABNORMAL LOW (ref 4.22–5.81)
RDW: 17.6 % — ABNORMAL HIGH (ref 11.5–15.5)
WBC: 6.9 K/uL (ref 4.0–10.5)

## 2015-02-04 LAB — RENAL FUNCTION PANEL
Albumin: 1.7 g/dL — ABNORMAL LOW (ref 3.5–5.0)
Anion gap: 8 (ref 5–15)
BUN: 46 mg/dL — ABNORMAL HIGH (ref 6–20)
CO2: 25 mmol/L (ref 22–32)
Calcium: 7.4 mg/dL — ABNORMAL LOW (ref 8.9–10.3)
Chloride: 102 mmol/L (ref 101–111)
Creatinine, Ser: 8.41 mg/dL — ABNORMAL HIGH (ref 0.61–1.24)
GFR calc Af Amer: 8 mL/min — ABNORMAL LOW (ref >60)
GFR calc non Af Amer: 7 mL/min — ABNORMAL LOW (ref >60)
Glucose, Bld: 139 mg/dL — ABNORMAL HIGH (ref 65–99)
Phosphorus: 2.8 mg/dL (ref 2.5–4.6)
Potassium: 4.3 mmol/L (ref 3.5–5.1)
Sodium: 135 mmol/L (ref 135–145)

## 2015-02-04 LAB — PROTIME-INR
INR: 2.06 — ABNORMAL HIGH (ref 0.00–1.49)
Prothrombin Time: 23.1 seconds — ABNORMAL HIGH (ref 11.6–15.2)

## 2015-02-04 LAB — GLUCOSE, CAPILLARY: Glucose-Capillary: 101 mg/dL — ABNORMAL HIGH (ref 65–99)

## 2015-02-04 MED ORDER — DICLOFENAC SODIUM 1 % TD GEL: 2.0000 g | Freq: Four times a day (QID) | TRANSDERMAL | Status: DC

## 2015-02-04 MED ORDER — WARFARIN SODIUM 5 MG PO TABS: 5.0000 mg | ORAL_TABLET | Freq: Once | ORAL | Status: DC

## 2015-02-04 MED ORDER — DOXERCALCIFEROL 4 MCG/2ML IV SOLN
INTRAVENOUS | Status: AC
  Administered 2015-02-04: 2.5 ug via INTRAVENOUS
  Filled 2015-02-04: qty 2

## 2015-02-04 MED ORDER — RENA-VITE PO TABS: 1.0000 | ORAL_TABLET | Freq: Every day | ORAL | Status: DC

## 2015-02-04 MED ORDER — DICYCLOMINE HCL 20 MG PO TABS: 20.0000 mg | ORAL_TABLET | Freq: Two times a day (BID) | ORAL | Status: DC

## 2015-02-04 MED ORDER — HYDROCERIN EX CREA: 1.0000 "application " | TOPICAL_CREAM | Freq: Two times a day (BID) | CUTANEOUS | Status: DC

## 2015-02-04 MED ORDER — HYDRALAZINE HCL 50 MG PO TABS: 50.0000 mg | ORAL_TABLET | Freq: Three times a day (TID) | ORAL | Status: DC

## 2015-02-04 MED ORDER — LISINOPRIL 20 MG PO TABS: 20.0000 mg | ORAL_TABLET | Freq: Every day | ORAL | Status: DC

## 2015-02-04 NOTE — Discharge Summary (Signed)
Physician Discharge Summary  Frank [name] SWN:[id number] DOB: 17-Aug-1965 DOA: 01/31/2015  PCP: Angelica Chessman, MD  Admit date: 01/31/2015 Discharge date: 02/04/2015  Recommendations for Outpatient Follow-up:  1. Pt will need to follow up with PCP in 2-3 weeks post discharge 2. Please also check CBC to evaluate Hg and Hct levels  Discharge Diagnoses:  Principal Problem:   Left upper extremity deep vein thrombosis Active Problems:   Chronic combined systolic and diastolic CHF (congestive heart failure)   DM (diabetes mellitus), type 2, uncontrolled, with renal complications   ESRD on hemodialysis   Hyperkalemia   Accelerated hypertension   History of noncompliance with medical treatment   Volume overload   Drug-seeking behavior   Malnutrition of moderate degree   Discharge Condition: Stable  Diet recommendation: Renal diet    HPI/Recap of past 24 hours:  49 y.o. male with history of ESRD on HD, DM, HTN, CHF, presented to the ED with chest pain. In the ED he had a CT scan done which shows no PE. In addition he was noted to have hyperkalemia requiring urgent dialysis.   Assessment/Plan: Principal Problem:  Left upper extremity deep vein thrombosis: Discussed this case with nephrology. Continued on Coumadin. INR therapeutic and pt wants to go home.   Active Problems:  Chronic combined systolic and diastolic CHF (congestive heart failure): managed by dialysis. Stable    Chest pain: Enzymes cycled. Negative. Musculoskeletal, maybe costochondritis. Stable this AM   DM (diabetes mellitus), type 2, uncontrolled, with renal complications: Stable blood sugars   Hyperkalemia: secondary to end-stage renal disease and missing dialysis. Resolved.    Accelerated hypertension: suspect patient noncompliant at home with medications. Compliance discussed in detail and pt has verbalized understanding.   History of noncompliance with medical treatment   Volume overload -  controlled with HD   Drug-seeking behavior: Suspect that this is the underlying issue.    Consultants:  nephrology  Procedures:  hemodialysis  Antibiotics:  none  Discharge Exam: Filed Vitals:   02/04/15 0830  BP: 146/94  Pulse: 80  Temp:   Resp: 20   Filed Vitals:   02/04/15 0724 02/04/15 0736 02/04/15 0800 02/04/15 0830  BP: 149/101 158/112 161/101 146/94  Pulse: 75 73 70 80  Temp:      TempSrc:      Resp: _0 Height:      Weight:      SpO2:        General: Pt is alert, follows commands appropriately, not in acute distress Cardiovascular: Regular rate and rhythm, no rubs, no gallops Respiratory: Clear to auscultation bilaterally, no wheezing, no crackles, no rhonchi Abdominal: Soft, non tender, non distended, bowel sounds +, no guarding Extremities: no cyanosis, pulses palpable bilaterally DP and PT Neuro: Grossly nonfocal  Discharge Instructions     Medication List    STOP taking these medications        minoxidil 2.5 MG tablet  Commonly known as:  LONITEN     sulfamethoxazole-trimethoprim 400-80 MG per tablet  Commonly known as:  BACTRIM,SEPTRA      TAKE these medications        ALPRAZolam 0.5 MG tablet  Commonly known as:  XANAX  Take 0.5 mg by mouth 2 (two) times daily as needed for anxiety.     amLODipine 10 MG tablet  Commonly known as:  NORVASC  Take 10 mg by mouth daily.     atorvastatin 40 MG tablet  Commonly known as:  LIPITOR  Take 1 tablet (40 mg total) by mouth daily at 6 PM.     carvedilol 25 MG tablet  Commonly known as:  COREG  Take 1 tablet (25 mg total) by mouth 2 (two) times daily with a meal.     cinacalcet 30 MG tablet  Commonly known as:  SENSIPAR  Take 30 mg by mouth daily.     diclofenac sodium 1 % Gel  Commonly known as:  VOLTAREN  Apply 2 g topically 4 (four) times daily.     dicyclomine 20 MG tablet  Commonly known as:  BENTYL  Take 1 tablet (20 mg total) by mouth 2 (two) times daily.      docusate sodium 100 MG capsule  Commonly known as:  COLACE  Take 1 capsule (100 mg total) by mouth 2 (two) times daily.     doxercalciferol 4 MCG/2ML injection  Commonly known as:  HECTOROL  Inject 1.25 mLs (2.5 mcg total) into the vein Every Tuesday,Thursday,and Saturday with dialysis.     hydrALAZINE 50 MG tablet  Commonly known as:  APRESOLINE  Take 1 tablet (50 mg total) by mouth every 8 (eight) hours.     hydrocerin Crea  Apply 1 application topically 2 (two) times daily.     levalbuterol 45 MCG/ACT inhaler  Commonly known as:  XOPENEX HFA  Inhale 2 puffs into the lungs every 6 (six) hours as needed for wheezing or shortness of breath.     lisinopril 20 MG tablet  Commonly known as:  PRINIVIL,ZESTRIL  Take 1 tablet (20 mg total) by mouth daily.     LORazepam 0.5 MG tablet  Commonly known as:  ATIVAN  Take 1 tablet (0.5 mg total) by mouth at bedtime as needed for anxiety.     methocarbamol 500 MG tablet  Commonly known as:  ROBAXIN  Take 2 tablets (1,000 mg total) by mouth every 8 (eight) hours as needed for muscle spasms.     multivitamin Tabs tablet  Take 1 tablet by mouth at bedtime.     omeprazole 20 MG capsule  Commonly known as:  PRILOSEC  Take 20 mg by mouth daily.     ondansetron 4 MG disintegrating tablet  Commonly known as:  ZOFRAN-ODT  Take 1 tablet (4 mg total) by mouth every 8 (eight) hours as needed for nausea or vomiting.     oxycodone 5 MG capsule  Commonly known as:  OXY-IR  Take 10 mg by mouth every 4 (four) hours as needed for pain.     predniSONE 5 MG tablet  Commonly known as:  DELTASONE  Take 5 mg by mouth daily with breakfast.     sevelamer carbonate 800 MG tablet  Commonly known as:  RENVELA  Take 800-1,600 mg by mouth 3 (three) times daily with meals. 2 tabs three times daily with meals, and 1 tablet with snacks     warfarin 4 MG tablet  Commonly known as:  COUMADIN  Take 1 tablet (4 mg total) by mouth daily at 6 PM.            The results of significant diagnostics from this hospitalization (including imaging, microbiology, ancillary and laboratory) are listed below for reference.     Microbiology: No results found for this or any previous visit (from the past 240 hour(s)).   Labs: Basic Metabolic Panel:  Recent Labs Lab 01/31/15 0046 01/31/15 1433 01/31/15 1444 02/01/15 0139 02/02/15 1300 02/04/15 0731  NA 137 136 135 135 139 135  K 5.1 6.7* 6.5* 4.1 4.8 4.3  CL 102 101 104 98* 106 102  CO2 22 21*  --  _0 GLUCOSE 94 67 64* 105* 116* 139*  BUN 55* 66* 64* 36* 32* 46*  CREATININE 10.65* 12.07* 12.10* 7.93* 6.86* 8.41*  CALCIUM 8.5* 8.3*  --  7.9* 7.2* 7.4*  PHOS  --   --   --   --  4.0 2.8   Liver Function Tests:  Recent Labs Lab 01/31/15 0150 02/01/15 0139 02/02/15 1300 02/04/15 0731  AST 137* 119*  --   --   ALT 72* 58  --   --   ALKPHOS 125 114  --   --   BILITOT 0.4 0.5  --   --   PROT 7.6 6.6  --   --   ALBUMIN 2.3* 2.0* 1.7* 1.7*    Recent Labs Lab 01/31/15 0150  LIPASE 45   CBC:  Recent Labs Lab 01/31/15 0046 01/31/15 1433 01/31/15 1444 02/01/15 0139 02/02/15 1300 02/04/15 0731  WBC 7.4 5.8  --  5.1 6.2 6.9  HGB 9.2* 9.3* 10.5* 8.7* 8.1* 8.2*  HCT 28.9* 28.6* 31.0* 27.1* 25.3* 26.0*  MCV 83.5 83.9  --  81.6 83.0 83.1  PLT 299 264  --  261 225 222   Cardiac Enzymes:  Recent Labs Lab 02/01/15 0139 02/01/15 1852  TROPONINI 0.07* 0.05*   BNP (last 3 results)  Recent Labs  11/26/14 2352 01/09/15 1529 01/26/15 0421  BNP >4500.0* 3606.1* >4500.0*    ProBNP (last 3 results)  Recent Labs  04/15/14 0809  PROBNP >70000.0*   CBG:  Recent Labs Lab 02/02/15 2131 02/03/15 0737 02/03/15 1141 02/03/15 1651 02/03/15 2112  GLUCAP 119* 97 138* 115* 143*   SIGNED: Time coordinating discharge: 30 minutes  MAGICK-Davyon Fisch, MD  [facility] 02/04/2015, 9:08 AM Pager [phone]  If 7PM-7AM, please contact  night-coverage [email or web address] Password [email or web address]

## 2015-02-04 NOTE — Procedures (Signed)
Tolerating hemodialysis without any hemodynamic issues at this time. POWELL,ALVIN C

## 2015-02-04 NOTE — Progress Notes (Signed)
Discharged from [phone]. All instructions discussed with patient. Teach back. No questions @ present.

## 2015-02-10 ENCOUNTER — Encounter (HOSPITAL_COMMUNITY): Payer: Self-pay | Admitting: Emergency Medicine

## 2015-02-10 ENCOUNTER — Emergency Department (HOSPITAL_COMMUNITY): Payer: Medicare Other

## 2015-02-10 ENCOUNTER — Inpatient Hospital Stay (HOSPITAL_COMMUNITY)
Admission: EM | Admit: 2015-02-10 | Discharge: 2015-02-16 | DRG: 987 | Disposition: A | Discharge status: Home / Self Care | Payer: Medicare Other | Attending: Internal Medicine | Admitting: Internal Medicine

## 2015-02-10 DIAGNOSIS — Z7952 Long term (current) use of systemic steroids: Secondary | ICD-10-CM

## 2015-02-10 DIAGNOSIS — Z87891 Personal history of nicotine dependence: Secondary | ICD-10-CM

## 2015-02-10 DIAGNOSIS — F329 Major depressive disorder, single episode, unspecified: Secondary | ICD-10-CM | POA: Diagnosis present

## 2015-02-10 DIAGNOSIS — D638 Anemia in other chronic diseases classified elsewhere: Secondary | ICD-10-CM | POA: Diagnosis present

## 2015-02-10 DIAGNOSIS — I248 Other forms of acute ischemic heart disease: Secondary | ICD-10-CM | POA: Diagnosis present

## 2015-02-10 DIAGNOSIS — I5042 Chronic combined systolic (congestive) and diastolic (congestive) heart failure: Secondary | ICD-10-CM | POA: Diagnosis present

## 2015-02-10 DIAGNOSIS — K4090 Unilateral inguinal hernia, without obstruction or gangrene, not specified as recurrent: Secondary | ICD-10-CM

## 2015-02-10 DIAGNOSIS — E1159 Type 2 diabetes mellitus with other circulatory complications: Secondary | ICD-10-CM | POA: Diagnosis present

## 2015-02-10 DIAGNOSIS — N433 Hydrocele, unspecified: Secondary | ICD-10-CM | POA: Diagnosis present

## 2015-02-10 DIAGNOSIS — E875 Hyperkalemia: Secondary | ICD-10-CM

## 2015-02-10 DIAGNOSIS — I120 Hypertensive chronic kidney disease with stage 5 chronic kidney disease or end stage renal disease: Secondary | ICD-10-CM | POA: Diagnosis present

## 2015-02-10 DIAGNOSIS — Z7901 Long term (current) use of anticoagulants: Secondary | ICD-10-CM

## 2015-02-10 DIAGNOSIS — I82C11 Acute embolism and thrombosis of right internal jugular vein: Secondary | ICD-10-CM | POA: Diagnosis present

## 2015-02-10 DIAGNOSIS — E1122 Type 2 diabetes mellitus with diabetic chronic kidney disease: Secondary | ICD-10-CM | POA: Diagnosis present

## 2015-02-10 DIAGNOSIS — IMO0002 Reserved for concepts with insufficient information to code with codable children: Secondary | ICD-10-CM | POA: Diagnosis present

## 2015-02-10 DIAGNOSIS — Z86718 Personal history of other venous thrombosis and embolism: Secondary | ICD-10-CM

## 2015-02-10 DIAGNOSIS — I82622 Acute embolism and thrombosis of deep veins of left upper extremity: Secondary | ICD-10-CM | POA: Diagnosis present

## 2015-02-10 DIAGNOSIS — Z794 Long term (current) use of insulin: Secondary | ICD-10-CM

## 2015-02-10 DIAGNOSIS — Z888 Allergy status to other drugs, medicaments and biological substances status: Secondary | ICD-10-CM

## 2015-02-10 DIAGNOSIS — Z992 Dependence on renal dialysis: Secondary | ICD-10-CM

## 2015-02-10 DIAGNOSIS — R0602 Shortness of breath: Secondary | ICD-10-CM | POA: Diagnosis not present

## 2015-02-10 DIAGNOSIS — E1165 Type 2 diabetes mellitus with hyperglycemia: Secondary | ICD-10-CM | POA: Diagnosis present

## 2015-02-10 DIAGNOSIS — F419 Anxiety disorder, unspecified: Secondary | ICD-10-CM | POA: Diagnosis present

## 2015-02-10 DIAGNOSIS — R1011 Right upper quadrant pain: Secondary | ICD-10-CM | POA: Diagnosis present

## 2015-02-10 DIAGNOSIS — E1129 Type 2 diabetes mellitus with other diabetic kidney complication: Secondary | ICD-10-CM | POA: Diagnosis present

## 2015-02-10 DIAGNOSIS — E785 Hyperlipidemia, unspecified: Secondary | ICD-10-CM | POA: Diagnosis present

## 2015-02-10 DIAGNOSIS — N2581 Secondary hyperparathyroidism of renal origin: Secondary | ICD-10-CM | POA: Diagnosis present

## 2015-02-10 DIAGNOSIS — Z940 Kidney transplant status: Secondary | ICD-10-CM

## 2015-02-10 DIAGNOSIS — K4030 Unilateral inguinal hernia, with obstruction, without gangrene, not specified as recurrent: Secondary | ICD-10-CM | POA: Diagnosis present

## 2015-02-10 DIAGNOSIS — Z886 Allergy status to analgesic agent status: Secondary | ICD-10-CM

## 2015-02-10 DIAGNOSIS — I10 Essential (primary) hypertension: Secondary | ICD-10-CM | POA: Diagnosis present

## 2015-02-10 DIAGNOSIS — R109 Unspecified abdominal pain: Secondary | ICD-10-CM

## 2015-02-10 DIAGNOSIS — N186 End stage renal disease: Secondary | ICD-10-CM | POA: Diagnosis present

## 2015-02-10 LAB — BASIC METABOLIC PANEL
Anion gap: 9 (ref 5–15)
BUN: 46 mg/dL — AB (ref 6–20)
CALCIUM: 7.9 mg/dL — AB (ref 8.9–10.3)
CHLORIDE: 97 mmol/L — AB (ref 101–111)
CO2: 24 mmol/L (ref 22–32)
Creatinine, Ser: 10.75 mg/dL — ABNORMAL HIGH (ref 0.61–1.24)
GFR calc Af Amer: 6 mL/min — ABNORMAL LOW (ref >60)
GFR, EST NON AFRICAN AMERICAN: 5 mL/min — AB (ref >60)
Glucose, Bld: 86 mg/dL (ref 65–99)
POTASSIUM: 6.6 mmol/L — AB (ref 3.5–5.1)
Sodium: 130 mmol/L — ABNORMAL LOW (ref 135–145)

## 2015-02-10 LAB — CBC
HCT: 27.8 % — ABNORMAL LOW (ref 39.0–52.0)
Hemoglobin: 9 g/dL — ABNORMAL LOW (ref 13.0–17.0)
MCH: 27.4 pg (ref 26.0–34.0)
MCHC: 32.4 g/dL (ref 30.0–36.0)
MCV: 84.5 fL (ref 78.0–100.0)
Platelets: 229 10*3/uL (ref 150–400)
RBC: 3.29 MIL/uL — ABNORMAL LOW (ref 4.22–5.81)
RDW: 18.9 % — AB (ref 11.5–15.5)
WBC: 9.6 10*3/uL (ref 4.0–10.5)

## 2015-02-10 LAB — I-STAT TROPONIN, ED: Troponin i, poc: 0.12 ng/mL (ref 0.00–0.08)

## 2015-02-10 MED ORDER — SODIUM BICARBONATE 8.4 % IV SOLN
50.0000 meq | Freq: Once | INTRAVENOUS | Status: AC
  Administered 2015-02-10: 50 meq via INTRAVENOUS
  Filled 2015-02-10: qty 50

## 2015-02-10 MED ORDER — DEXTROSE 50 % IV SOLN
1.0000 | Freq: Once | INTRAVENOUS | Status: AC
  Administered 2015-02-10: 50 mL via INTRAVENOUS
  Filled 2015-02-10: qty 50

## 2015-02-10 MED ORDER — SODIUM POLYSTYRENE SULFONATE 15 GM/60ML PO SUSP
60.0000 g | Freq: Once | ORAL | Status: DC
  Filled 2015-02-10: qty 240

## 2015-02-10 MED ORDER — INSULIN ASPART 100 UNIT/ML IV SOLN
10.0000 [IU] | Freq: Once | INTRAVENOUS | Status: AC
  Administered 2015-02-10: 10 [IU] via INTRAVENOUS
  Filled 2015-02-10: qty 1

## 2015-02-10 NOTE — ED Notes (Signed)
IV team at bedside 

## 2015-02-10 NOTE — ED Notes (Addendum)
Pt refusing rectal temp and PO kayexalate; MD informed

## 2015-02-10 NOTE — ED Notes (Addendum)
RN attempted IV access x 2 with no success; pt requesting IV team

## 2015-02-10 NOTE — ED Notes (Signed)
Patient here with shortness of breath, sudden onset.  Patient states that he has a known clot in his arm where his dialysis catheter is.  Patient states that he could feel it move from his arm to his chest.

## 2015-02-10 NOTE — ED Provider Notes (Signed)
CSN: [id number]     Arrival date & time 02/10/15  2111 History   First MD Initiated Contact with Patient 02/10/15 2244     Chief Complaint  Patient presents with  . Shortness of Breath     (Consider location/radiation/quality/duration/timing/severity/associated sxs/prior Treatment) HPI Frank [name] is a 49 y.o. male with a history of hypertension, diabetes, end-stage renal disease on hemodialysis, CHF, hyperkalemia, comes in for evaluation of shortness of breath. Patient states he was diagnosed with a blood clot in his left arm and he "can feel it traveling to his chest ". He reports when he takes a deep breaths he can feel "a knot in his stomach". He denies any chest pain or overt shortness of breath. He reports when he breathes faster, the "knot" feeling in his stomach worsens. Patient cannot further clarify stomach discomfort. Reports he last had dialysis on Tuesday and Wednesday of this week and went for the full time. No other modifying factors.  Past Medical History  Diagnosis Date  . Hypertension   . Depression   . Complication of anesthesia     itching, sore throat  . Diabetes mellitus without complication     No history per patient, but remains under history as A1c would not be accurate given on dialysis  . Shortness of breath   . Anxiety   . ESRD (end stage renal disease)     due to HTN per patient, followed at [facility], s/p failed kidney transplant - dialysis Tue, Th, Sat  . Renal insufficiency   . CHF (congestive heart failure)   . Anemia   . Dialysis patient    Past Surgical History  Procedure Laterality Date  . Kidney receipient  2006    failed and started HD in March 2014  . Capd insertion    . Capd removal    . Left heart catheterization with coronary angiogram N/A 09/02/2014    Procedure: LEFT HEART CATHETERIZATION WITH CORONARY ANGIOGRAM;  Surgeon: [name], MD;  Location: [facility] CATH LAB;  Service: Cardiovascular;  Laterality: N/A;   No family history on  file. History  Substance Use Topics  . Smoking status: Former Smoker -- 1.00 packs/day for 1 years    Types: Cigarettes  . Smokeless tobacco: Never Used     Comment: quit Jan 2014  . Alcohol Use: No    Review of Systems A 10 point review of systems was completed and was negative except for pertinent positives and negatives as mentioned in the history of present illness     Allergies  Ferrlecit and Darvocet  Home Medications   Prior to Admission medications   Medication Sig Start Date End Date Taking? Authorizing Provider  ALPRAZolam [name]) 0.5 MG tablet Take 0.5 mg by mouth 2 (two) times daily as needed for anxiety.    Historical Provider, MD  amLODipine (NORVASC) 10 MG tablet Take 10 mg by mouth daily.    Historical Provider, MD  atorvastatin (LIPITOR) 40 MG tablet Take 1 tablet (40 mg total) by mouth daily at 6 PM. 09/02/14   Barton Dubois, MD  carvedilol (COREG) 25 MG tablet Take 1 tablet (25 mg total) by mouth 2 (two) times daily with a meal. 10/10/14   [name], DO  cinacalcet (SENSIPAR) 30 MG tablet Take 30 mg by mouth daily.    Historical Provider, MD  diclofenac sodium (VOLTAREN) 1 % GEL Apply 2 g topically 4 (four) times daily. 02/04/15   [name], MD  dicyclomine (  BENTYL) 20 MG tablet Take 1 tablet (20 mg total) by mouth 2 (two) times daily. 02/04/15   [name], MD  docusate sodium (COLACE) 100 MG capsule Take 1 capsule (100 mg total) by mouth 2 (two) times daily. 11/19/14   [name], MD  doxercalciferol (HECTOROL) 4 MCG/2ML injection Inject 1.25 mLs (2.5 mcg total) into the vein Every Tuesday,Thursday,and Saturday with dialysis. 10/10/14   [name], DO  hydrALAZINE (APRESOLINE) 50 MG tablet Take 1 tablet (50 mg total) by mouth every 8 (eight) hours. 02/04/15   [name], MD  hydrocerin (EUCERIN) CREA Apply 1 application topically 2 (two) times daily. 02/04/15   [name], MD  levalbuterol [facility] HFA) 45 MCG/ACT inhaler Inhale 2 puffs into  the lungs every 6 (six) hours as needed for wheezing or shortness of breath. 11/19/14   [name], MD  lisinopril (PRINIVIL,ZESTRIL) 20 MG tablet Take 1 tablet (20 mg total) by mouth daily. 02/04/15   [name], MD  LORazepam (ATIVAN) 0.5 MG tablet Take 1 tablet (0.5 mg total) by mouth at bedtime as needed for anxiety. Patient not taking: Reported on 02/01/2015 11/19/14   [name], MD  methocarbamol (ROBAXIN) 500 MG tablet Take 2 tablets (1,000 mg total) by mouth every 8 (eight) hours as needed for muscle spasms. 01/26/15   [name], MD  multivitamin (RENA-VIT) TABS tablet Take 1 tablet by mouth at bedtime. 02/04/15   [name], MD  omeprazole (PRILOSEC) 20 MG capsule Take 20 mg by mouth daily. 07/01/13   Historical Provider, MD  ondansetron (ZOFRAN-ODT) 4 MG disintegrating tablet Take 1 tablet (4 mg total) by mouth every 8 (eight) hours as needed for nausea or vomiting. 12/05/14   Verlee Monte, MD  oxycodone (OXY-IR) 5 MG capsule Take 10 mg by mouth every 4 (four) hours as needed for pain.    Historical Provider, MD  predniSONE (DELTASONE) 5 MG tablet Take 5 mg by mouth daily with breakfast.    Historical Provider, MD  sevelamer carbonate (RENVELA) 800 MG tablet Take 800-1,600 mg by mouth 3 (three) times daily with meals. 2 tabs three times daily with meals, and 1 tablet with snacks    Historical Provider, MD  warfarin (COUMADIN) 4 MG tablet Take 1 tablet (4 mg total) by mouth daily at 6 PM. 12/05/14   Mutaz Elmahi, MD   BP 138/89 mmHg  Pulse 80  Temp(Src) 98.6 F (37 C) (Oral)  Resp 0  Wt 179 lb (81.194 kg)  SpO2 98% Physical Exam  Constitutional: He is oriented to person, place, and time. He appears well-developed and well-nourished.  HENT:  Head: Normocephalic and atraumatic.  Mouth/Throat: Oropharynx is clear and moist.  Eyes: Conjunctivae are normal. Pupils are equal, round, and reactive to light. Right eye exhibits no discharge. Left eye exhibits no discharge. No  scleral icterus.  Neck: Normal range of motion. Neck supple.  Cardiovascular: Normal rate, regular rhythm and normal heart sounds.   Pulmonary/Chest: Effort normal and breath sounds normal. No respiratory distress. He has no wheezes. He has no rales.  Abdominal: Soft.  Abdomen mildly distended with diffuse tenderness.  Musculoskeletal: He exhibits no tenderness.  Neurological: He is alert and oriented to person, place, and time.  Cranial Nerves II-XII grossly intact  Skin: Skin is warm and dry. No rash noted.  Dry skin  Psychiatric: He has a normal mood and affect.  Nursing note and vitals reviewed.   ED Course  Procedures (including critical care time) Labs Review Labs Reviewed  BASIC METABOLIC PANEL - Abnormal; Notable for the following:    Sodium 130 (*)    Potassium 6.6 (*)    Chloride 97 (*)    BUN 46 (*)    Creatinine, Ser 10.75 (*)    Calcium 7.9 (*)    GFR calc non Af Amer 5 (*)    GFR calc Af Amer 6 (*)    All other components within normal limits  CBC - Abnormal; Notable for the following:    RBC 3.29 (*)    Hemoglobin 9.0 (*)    HCT 27.8 (*)    RDW 18.9 (*)    All other components within normal limits  PROTIME-INR - Abnormal; Notable for the following:    Prothrombin Time 18.1 (*)    All other components within normal limits  HEPATIC FUNCTION PANEL - Abnormal; Notable for the following:    Total Protein 6.4 (*)    Albumin 2.2 (*)    AST 61 (*)    All other components within normal limits  LIPASE, BLOOD - Abnormal; Notable for the following:    Lipase 17 (*)    All other components within normal limits  I-STAT TROPOININ, ED - Abnormal; Notable for the following:    Troponin i, poc 0.12 (*)    All other components within normal limits    Imaging Review Dg Chest 2 View  02/10/2015   CLINICAL DATA:  Acute onset of generalized chest pain and lethargy. Shortness of breath. Initial encounter.  EXAM: CHEST  2 VIEW  COMPARISON:  Chest radiograph and CTA of the  chest performed 01/31/2015  FINDINGS: The lungs are well-aerated. Mild vascular congestion is noted. There is no evidence of focal opacification, pleural effusion or pneumothorax.  The heart is mildly enlarged. A right-sided dual-lumen catheter is noted ending within the right atrium. No acute osseous abnormalities are seen.  IMPRESSION: Mild vascular congestion and mild cardiomegaly noted. Lungs remain grossly clear.   Electronically Signed   By: Garald Balding M.D.   On: 02/10/2015 21:51     EKG Interpretation   Date/Time:  Friday February 10 2015 21:23:42 EDT Ventricular Rate:  70 PR Interval:  184 QRS Duration: 80 QT Interval:  412 QTC Calculation: 444 R Axis:   -50 Text Interpretation:  Normal sinus rhythm Possible Left atrial enlargement  Left axis deviation Left ventricular hypertrophy with repolarization  abnormality Abnormal ECG ED PHYSICIAN INTERPRETATION AVAILABLE IN [facility] Confirmed by TEST, Record ([id number]) on 02/11/2015 10:47:40 AM     Meds given in ED:  Medications  insulin aspart (novoLOG) injection 10 Units (10 Units Intravenous Given 02/10/15 2342)  dextrose 50 % solution 50 mL (50 mLs Intravenous Given 02/10/15 2342)  sodium bicarbonate injection 50 mEq (50 mEq Intravenous Given 02/10/15 2342)  sodium polystyrene (KAYEXALATE) 15 GM/60ML suspension 60 g (60 g Oral Given 02/10/15 2352)    New Prescriptions   No medications on file   Filed Vitals:   02/10/15 2357 02/11/15 0000 02/11/15 0015 02/11/15 0030  BP: 126/84 130/79 124/78 138/89  Pulse: 75 73 79 80  Temp:      TempSrc:      Resp: 17 0 0 0  Weight:      SpO2: 100% 98% 97% 98%    MDM  Vitals stable - WNL -afebrile Pt appears to be resting comfortably in ED. PE--lungs clear to auscultation bilaterally. No dyspnea or evidence of respiratory distress. Labwork--potassium  6.6, troponin slightly elevated at 0.12 suspect secondary to renal dysfunction, creatinine 10.75. Labs otherwise Baseline and  noncontributory.  EKG is unchanged from prior study.  Discussed with nephrology, recommends 60 mg Kayexalate and admission for dialysis tomorrow. Discussed patient presentation and ED course my attending, Dr. Lita Mains, who also saw and evaluated the patient and agrees with plan for admission. Consult to hospitalist. Dr. Daryll Drown to see in the ED. Pt admitted  Final diagnoses:  Hyperkalemia      Comer Locket, PA-C 02/11/15 1543  [name], MD 02/14/15 [phone]

## 2015-02-11 ENCOUNTER — Inpatient Hospital Stay (HOSPITAL_COMMUNITY): Payer: Medicare Other

## 2015-02-11 DIAGNOSIS — R1011 Right upper quadrant pain: Secondary | ICD-10-CM

## 2015-02-11 DIAGNOSIS — I82C11 Acute embolism and thrombosis of right internal jugular vein: Secondary | ICD-10-CM

## 2015-02-11 DIAGNOSIS — R0602 Shortness of breath: Secondary | ICD-10-CM | POA: Diagnosis present

## 2015-02-11 DIAGNOSIS — E875 Hyperkalemia: Secondary | ICD-10-CM | POA: Diagnosis not present

## 2015-02-11 DIAGNOSIS — E1165 Type 2 diabetes mellitus with hyperglycemia: Secondary | ICD-10-CM

## 2015-02-11 DIAGNOSIS — D631 Anemia in chronic kidney disease: Secondary | ICD-10-CM

## 2015-02-11 DIAGNOSIS — E785 Hyperlipidemia, unspecified: Secondary | ICD-10-CM | POA: Diagnosis present

## 2015-02-11 DIAGNOSIS — Z86718 Personal history of other venous thrombosis and embolism: Secondary | ICD-10-CM | POA: Diagnosis not present

## 2015-02-11 DIAGNOSIS — F329 Major depressive disorder, single episode, unspecified: Secondary | ICD-10-CM | POA: Diagnosis present

## 2015-02-11 DIAGNOSIS — I5042 Chronic combined systolic (congestive) and diastolic (congestive) heart failure: Secondary | ICD-10-CM | POA: Diagnosis present

## 2015-02-11 DIAGNOSIS — E1122 Type 2 diabetes mellitus with diabetic chronic kidney disease: Secondary | ICD-10-CM | POA: Diagnosis present

## 2015-02-11 DIAGNOSIS — D638 Anemia in other chronic diseases classified elsewhere: Secondary | ICD-10-CM | POA: Diagnosis present

## 2015-02-11 DIAGNOSIS — Z886 Allergy status to analgesic agent status: Secondary | ICD-10-CM | POA: Diagnosis not present

## 2015-02-11 DIAGNOSIS — Z992 Dependence on renal dialysis: Secondary | ICD-10-CM

## 2015-02-11 DIAGNOSIS — K4030 Unilateral inguinal hernia, with obstruction, without gangrene, not specified as recurrent: Secondary | ICD-10-CM | POA: Diagnosis not present

## 2015-02-11 DIAGNOSIS — F419 Anxiety disorder, unspecified: Secondary | ICD-10-CM | POA: Diagnosis present

## 2015-02-11 DIAGNOSIS — Z794 Long term (current) use of insulin: Secondary | ICD-10-CM | POA: Diagnosis not present

## 2015-02-11 DIAGNOSIS — I82622 Acute embolism and thrombosis of deep veins of left upper extremity: Secondary | ICD-10-CM | POA: Diagnosis not present

## 2015-02-11 DIAGNOSIS — Z940 Kidney transplant status: Secondary | ICD-10-CM | POA: Diagnosis not present

## 2015-02-11 DIAGNOSIS — N186 End stage renal disease: Secondary | ICD-10-CM | POA: Diagnosis not present

## 2015-02-11 DIAGNOSIS — N2581 Secondary hyperparathyroidism of renal origin: Secondary | ICD-10-CM | POA: Diagnosis not present

## 2015-02-11 DIAGNOSIS — Z7952 Long term (current) use of systemic steroids: Secondary | ICD-10-CM | POA: Diagnosis not present

## 2015-02-11 DIAGNOSIS — I248 Other forms of acute ischemic heart disease: Secondary | ICD-10-CM | POA: Diagnosis present

## 2015-02-11 DIAGNOSIS — Z87891 Personal history of nicotine dependence: Secondary | ICD-10-CM | POA: Diagnosis not present

## 2015-02-11 DIAGNOSIS — R1013 Epigastric pain: Secondary | ICD-10-CM

## 2015-02-11 DIAGNOSIS — Z7901 Long term (current) use of anticoagulants: Secondary | ICD-10-CM | POA: Diagnosis not present

## 2015-02-11 DIAGNOSIS — Z888 Allergy status to other drugs, medicaments and biological substances status: Secondary | ICD-10-CM | POA: Diagnosis not present

## 2015-02-11 DIAGNOSIS — I132 Hypertensive heart and chronic kidney disease with heart failure and with stage 5 chronic kidney disease, or end stage renal disease: Secondary | ICD-10-CM | POA: Diagnosis not present

## 2015-02-11 DIAGNOSIS — I120 Hypertensive chronic kidney disease with stage 5 chronic kidney disease or end stage renal disease: Secondary | ICD-10-CM | POA: Diagnosis present

## 2015-02-11 DIAGNOSIS — N433 Hydrocele, unspecified: Secondary | ICD-10-CM | POA: Diagnosis present

## 2015-02-11 DIAGNOSIS — I2782 Chronic pulmonary embolism: Secondary | ICD-10-CM

## 2015-02-11 LAB — CBC
HCT: 27.8 % — ABNORMAL LOW (ref 39.0–52.0)
HEMOGLOBIN: 8.8 g/dL — AB (ref 13.0–17.0)
MCH: 26.7 pg (ref 26.0–34.0)
MCHC: 31.7 g/dL (ref 30.0–36.0)
MCV: 84.2 fL (ref 78.0–100.0)
Platelets: 214 10*3/uL (ref 150–400)
RBC: 3.3 MIL/uL — ABNORMAL LOW (ref 4.22–5.81)
RDW: 19 % — AB (ref 11.5–15.5)
WBC: 10.8 10*3/uL — AB (ref 4.0–10.5)

## 2015-02-11 LAB — COMPREHENSIVE METABOLIC PANEL
ALBUMIN: 2.1 g/dL — AB (ref 3.5–5.0)
ALT: 20 U/L (ref 17–63)
ANION GAP: 10 (ref 5–15)
AST: 63 U/L — ABNORMAL HIGH (ref 15–41)
Alkaline Phosphatase: 95 U/L (ref 38–126)
BUN: 51 mg/dL — AB (ref 6–20)
CALCIUM: 8 mg/dL — AB (ref 8.9–10.3)
CO2: 25 mmol/L (ref 22–32)
CREATININE: 11.24 mg/dL — AB (ref 0.61–1.24)
Chloride: 96 mmol/L — ABNORMAL LOW (ref 101–111)
GFR calc Af Amer: 5 mL/min — ABNORMAL LOW (ref >60)
GFR, EST NON AFRICAN AMERICAN: 5 mL/min — AB (ref >60)
Glucose, Bld: 73 mg/dL (ref 65–99)
POTASSIUM: 5.6 mmol/L — AB (ref 3.5–5.1)
SODIUM: 131 mmol/L — AB (ref 135–145)
Total Bilirubin: 0.8 mg/dL (ref 0.3–1.2)
Total Protein: 6.1 g/dL — ABNORMAL LOW (ref 6.5–8.1)

## 2015-02-11 LAB — RENAL FUNCTION PANEL
ANION GAP: 8 (ref 5–15)
Albumin: 1.9 g/dL — ABNORMAL LOW (ref 3.5–5.0)
BUN: 27 mg/dL — ABNORMAL HIGH (ref 6–20)
CHLORIDE: 97 mmol/L — AB (ref 101–111)
CO2: 28 mmol/L (ref 22–32)
Calcium: 7.3 mg/dL — ABNORMAL LOW (ref 8.9–10.3)
Creatinine, Ser: 6.77 mg/dL — ABNORMAL HIGH (ref 0.61–1.24)
GFR, EST AFRICAN AMERICAN: 10 mL/min — AB (ref >60)
GFR, EST NON AFRICAN AMERICAN: 9 mL/min — AB (ref >60)
GLUCOSE: 111 mg/dL — AB (ref 65–99)
POTASSIUM: 4.9 mmol/L (ref 3.5–5.1)
Phosphorus: 4.1 mg/dL (ref 2.5–4.6)
SODIUM: 133 mmol/L — AB (ref 135–145)

## 2015-02-11 LAB — ETHANOL

## 2015-02-11 LAB — GLUCOSE, CAPILLARY
GLUCOSE-CAPILLARY: 73 mg/dL (ref 65–99)
Glucose-Capillary: 78 mg/dL (ref 65–99)
Glucose-Capillary: 136 mg/dL — ABNORMAL HIGH (ref 65–99)
Glucose-Capillary: 136 mg/dL — ABNORMAL HIGH (ref 65–99)

## 2015-02-11 LAB — HEPATIC FUNCTION PANEL
ALT: 17 U/L (ref 17–63)
AST: 61 U/L — ABNORMAL HIGH (ref 15–41)
Albumin: 2.2 g/dL — ABNORMAL LOW (ref 3.5–5.0)
Alkaline Phosphatase: 98 U/L (ref 38–126)
BILIRUBIN INDIRECT: 0.5 mg/dL (ref 0.3–0.9)
Bilirubin, Direct: 0.1 mg/dL (ref 0.1–0.5)
Total Bilirubin: 0.6 mg/dL (ref 0.3–1.2)
Total Protein: 6.4 g/dL — ABNORMAL LOW (ref 6.5–8.1)

## 2015-02-11 LAB — PROTIME-INR
INR: 1.49 (ref 0.00–1.49)
INR: 1.61 — ABNORMAL HIGH (ref 0.00–1.49)
PROTHROMBIN TIME: 19.1 s — AB (ref 11.6–15.2)
Prothrombin Time: 18.1 seconds — ABNORMAL HIGH (ref 11.6–15.2)

## 2015-02-11 LAB — CBG MONITORING, ED
GLUCOSE-CAPILLARY: 40 mg/dL — AB (ref 65–99)
GLUCOSE-CAPILLARY: 116 mg/dL — AB (ref 65–99)
Glucose-Capillary: 47 mg/dL — ABNORMAL LOW (ref 65–99)

## 2015-02-11 LAB — HEPARIN LEVEL (UNFRACTIONATED)
HEPARIN UNFRACTIONATED: 0.27 [IU]/mL — AB (ref 0.30–0.70)
Heparin Unfractionated: 0.19 IU/mL — ABNORMAL LOW (ref 0.30–0.70)

## 2015-02-11 LAB — LIPASE, BLOOD: Lipase: 17 U/L — ABNORMAL LOW (ref 22–51)

## 2015-02-11 LAB — TROPONIN I
TROPONIN I: 0.07 ng/mL — AB (ref <0.031)
Troponin I: 0.09 ng/mL — ABNORMAL HIGH (ref <0.031)

## 2015-02-11 MED ORDER — CARVEDILOL 25 MG PO TABS
25.0000 mg | ORAL_TABLET | Freq: Two times a day (BID) | ORAL | Status: DC
  Administered 2015-02-11 – 2015-02-15 (×7): 25 mg via ORAL
  Filled 2015-02-11 (×11): qty 1

## 2015-02-11 MED ORDER — ALTEPLASE 2 MG IJ SOLR
2.0000 mg | Freq: Once | INTRAMUSCULAR | Status: AC | PRN
  Filled 2015-02-11: qty 2

## 2015-02-11 MED ORDER — SODIUM CHLORIDE 0.9 % IV SOLN: 100.0000 mL | INTRAVENOUS | Status: DC | PRN

## 2015-02-11 MED ORDER — LIDOCAINE HCL (PF) 1 % IJ SOLN: 5.0000 mL | INTRAMUSCULAR | Status: DC | PRN

## 2015-02-11 MED ORDER — DOXERCALCIFEROL 4 MCG/2ML IV SOLN: 2.5000 ug | INTRAVENOUS | Status: DC

## 2015-02-11 MED ORDER — LIDOCAINE-PRILOCAINE 2.5-2.5 % EX CREA
1.0000 "application " | TOPICAL_CREAM | CUTANEOUS | Status: DC | PRN
  Filled 2015-02-11: qty 5

## 2015-02-11 MED ORDER — DEXTROSE 50 % IV SOLN
1.0000 | Freq: Once | INTRAVENOUS | Status: AC
  Administered 2015-02-11: 50 mL via INTRAVENOUS
  Filled 2015-02-11: qty 50

## 2015-02-11 MED ORDER — INSULIN ASPART 100 UNIT/ML ~~LOC~~ SOLN: 0.0000 [IU] | Freq: Every day | SUBCUTANEOUS | Status: DC

## 2015-02-11 MED ORDER — HEPARIN SODIUM (PORCINE) 1000 UNIT/ML DIALYSIS
100.0000 [IU]/kg | INTRAMUSCULAR | Status: DC | PRN
  Filled 2015-02-11: qty 8

## 2015-02-11 MED ORDER — WARFARIN SODIUM 7.5 MG PO TABS
7.5000 mg | ORAL_TABLET | Freq: Once | ORAL | Status: AC
  Administered 2015-02-11: 7.5 mg via ORAL
  Filled 2015-02-11 (×2): qty 1

## 2015-02-11 MED ORDER — PENTAFLUOROPROP-TETRAFLUOROETH EX AERO: 1.0000 "application " | INHALATION_SPRAY | CUTANEOUS | Status: DC | PRN

## 2015-02-11 MED ORDER — DOCUSATE SODIUM 100 MG PO CAPS
100.0000 mg | ORAL_CAPSULE | Freq: Two times a day (BID) | ORAL | Status: DC
  Administered 2015-02-11 – 2015-02-16 (×10): 100 mg via ORAL
  Filled 2015-02-11 (×13): qty 1

## 2015-02-11 MED ORDER — CINACALCET HCL 30 MG PO TABS
30.0000 mg | ORAL_TABLET | Freq: Every day | ORAL | Status: DC
  Administered 2015-02-11 – 2015-02-16 (×6): 30 mg via ORAL
  Filled 2015-02-11 (×8): qty 1

## 2015-02-11 MED ORDER — ATORVASTATIN CALCIUM 40 MG PO TABS
40.0000 mg | ORAL_TABLET | Freq: Every day | ORAL | Status: DC
  Administered 2015-02-11 – 2015-02-16 (×6): 40 mg via ORAL
  Filled 2015-02-11 (×6): qty 1

## 2015-02-11 MED ORDER — LEVALBUTEROL HCL 0.63 MG/3ML IN NEBU: 0.6300 mg | INHALATION_SOLUTION | Freq: Four times a day (QID) | RESPIRATORY_TRACT | Status: DC | PRN

## 2015-02-11 MED ORDER — HEPARIN BOLUS VIA INFUSION
2000.0000 [IU] | Freq: Once | INTRAVENOUS | Status: AC
  Administered 2015-02-11: 2000 [IU] via INTRAVENOUS
  Filled 2015-02-11: qty 2000

## 2015-02-11 MED ORDER — SEVELAMER CARBONATE 800 MG PO TABS
800.0000 mg | ORAL_TABLET | Freq: Two times a day (BID) | ORAL | Status: DC | PRN
  Administered 2015-02-12 – 2015-02-13 (×2): 800 mg via ORAL
  Filled 2015-02-11: qty 1

## 2015-02-11 MED ORDER — HEPARIN (PORCINE) IN NACL 100-0.45 UNIT/ML-% IJ SOLN
2250.0000 [IU]/h | INTRAMUSCULAR | Status: DC
  Administered 2015-02-11: 1900 [IU]/h via INTRAVENOUS
  Administered 2015-02-11: 2050 [IU]/h via INTRAVENOUS
  Administered 2015-02-12: 2250 [IU]/h via INTRAVENOUS
  Filled 2015-02-11 (×4): qty 250

## 2015-02-11 MED ORDER — PREDNISONE 5 MG PO TABS
5.0000 mg | ORAL_TABLET | Freq: Every day | ORAL | Status: DC
  Administered 2015-02-11 – 2015-02-16 (×6): 5 mg via ORAL
  Filled 2015-02-11 (×8): qty 1

## 2015-02-11 MED ORDER — ALUM & MAG HYDROXIDE-SIMETH 200-200-20 MG/5ML PO SUSP: 30.0000 mL | Freq: Four times a day (QID) | ORAL | Status: DC | PRN

## 2015-02-11 MED ORDER — OXYCODONE HCL 5 MG PO TABS
10.0000 mg | ORAL_TABLET | ORAL | Status: DC | PRN
  Administered 2015-02-11 – 2015-02-15 (×13): 10 mg via ORAL
  Filled 2015-02-11 (×11): qty 2

## 2015-02-11 MED ORDER — AMLODIPINE BESYLATE 10 MG PO TABS: 10.0000 mg | ORAL_TABLET | Freq: Every day | ORAL | Status: DC

## 2015-02-11 MED ORDER — ALPRAZOLAM 0.5 MG PO TABS
0.5000 mg | ORAL_TABLET | Freq: Two times a day (BID) | ORAL | Status: DC | PRN
  Administered 2015-02-12 – 2015-02-15 (×6): 0.5 mg via ORAL
  Filled 2015-02-11 (×3): qty 1
  Filled 2015-02-11: qty 2
  Filled 2015-02-11 (×4): qty 1

## 2015-02-11 MED ORDER — PANTOPRAZOLE SODIUM 40 MG IV SOLR
40.0000 mg | INTRAVENOUS | Status: DC
  Administered 2015-02-11 – 2015-02-16 (×6): 40 mg via INTRAVENOUS
  Filled 2015-02-11 (×8): qty 40

## 2015-02-11 MED ORDER — POLYETHYLENE GLYCOL 3350 17 G PO PACK
17.0000 g | PACK | Freq: Two times a day (BID) | ORAL | Status: DC
Start: 2015-02-11 — End: 2015-02-16
  Administered 2015-02-11 – 2015-02-16 (×5): 17 g via ORAL
  Filled 2015-02-11 (×10): qty 1

## 2015-02-11 MED ORDER — SEVELAMER CARBONATE 800 MG PO TABS
1600.0000 mg | ORAL_TABLET | Freq: Three times a day (TID) | ORAL | Status: DC
  Administered 2015-02-11 – 2015-02-16 (×12): 1600 mg via ORAL
  Filled 2015-02-11 (×17): qty 2

## 2015-02-11 MED ORDER — WARFARIN - PHARMACIST DOSING INPATIENT: Freq: Every day | Status: DC

## 2015-02-11 MED ORDER — LISINOPRIL 20 MG PO TABS: 20.0000 mg | ORAL_TABLET | Freq: Every day | ORAL | Status: DC

## 2015-02-11 MED ORDER — SODIUM CHLORIDE 0.9 % IJ SOLN
3.0000 mL | Freq: Two times a day (BID) | INTRAMUSCULAR | Status: DC
  Administered 2015-02-11 – 2015-02-13 (×4): 3 mL via INTRAVENOUS

## 2015-02-11 MED ORDER — HEPARIN SODIUM (PORCINE) 1000 UNIT/ML DIALYSIS: 1000.0000 [IU] | INTRAMUSCULAR | Status: DC | PRN

## 2015-02-11 MED ORDER — RENA-VITE PO TABS
1.0000 | ORAL_TABLET | Freq: Every day | ORAL | Status: DC
  Administered 2015-02-11 – 2015-02-15 (×5): 1 via ORAL
  Filled 2015-02-11 (×6): qty 1

## 2015-02-11 MED ORDER — HYDRALAZINE HCL 50 MG PO TABS
50.0000 mg | ORAL_TABLET | Freq: Three times a day (TID) | ORAL | Status: DC
  Administered 2015-02-11 – 2015-02-16 (×14): 50 mg via ORAL
  Filled 2015-02-11 (×21): qty 1

## 2015-02-11 MED ORDER — ONDANSETRON 4 MG PO TBDP: 4.0000 mg | ORAL_TABLET | Freq: Three times a day (TID) | ORAL | Status: DC | PRN

## 2015-02-11 MED ORDER — INSULIN ASPART 100 UNIT/ML ~~LOC~~ SOLN
0.0000 [IU] | Freq: Three times a day (TID) | SUBCUTANEOUS | Status: DC
  Administered 2015-02-11 – 2015-02-12 (×2): 1 [IU] via SUBCUTANEOUS

## 2015-02-11 NOTE — Progress Notes (Addendum)
ANTICOAGULATION CONSULT NOTE - Initial Consult  Pharmacy Consult for Heparin/Warfarin  Indication: Hx VTE  Allergies  Allergen Reactions  . Ferrlecit [Na Ferric Gluc Cplx In Sucrose] Shortness Of Breath, Swelling and Other (See Comments)    Swelling in throat  . Darvocet [Propoxyphene N-Acetaminophen] Hives    Patient Measurements: Weight: 179 lb (81.194 kg)  Vital Signs: Temp: 98.6 F (37 C) (07/22 2128) Temp Source: Oral (07/22 2128) BP: 128/93 mmHg (07/23 0145) Pulse Rate: 77 (07/23 0145)  Labs:  Recent Labs  02/10/15 2123 02/10/15 2136  HGB 9.0*  --   HCT 27.8*  --   PLT 229  --   LABPROT  --  18.1*  INR  --  1.49  CREATININE 10.75*  --     Estimated Creatinine Clearance: 9.4 mL/min (by C-G formula based on Cr of 10.75).   Medical History: Past Medical History  Diagnosis Date  . Hypertension   . Depression   . Complication of anesthesia     itching, sore throat  . Diabetes mellitus without complication     No history per patient, but remains under history as A1c would not be accurate given on dialysis  . Shortness of breath   . Anxiety   . ESRD (end stage renal disease)     due to HTN per patient, followed at [facility], s/p failed kidney transplant - dialysis Tue, Th, Sat  . Renal insufficiency   . CHF (congestive heart failure)   . Anemia   . Dialysis patient     Assessment: 49 y/o M with hx of DVT/PE on warfarin PTA. INR is sub-therapeutic on admit at 1.49, warfarin dose from recent DC summary ~ 1 week ago was 4 mg daily. Hgb 9, other labs reviewed.   Goal of Therapy:  INR 2-3 Monitor platelets by anticoagulation protocol: Yes   Plan:  -Warfarin 7.5 mg PO x 1 now  -Daily PT/INR, adjust dose as needed -Monitor for bleeding  Narda Bonds 02/11/2015,2:17 AM   ==================== Adding heparin until INR is therapeutic, pt was sub-therapeutic on 1700 units/hr of heparin during previous admission -Start heparin infusion at 1900  units/hr -1100 HL -Daily CBC/HL -Monitor for bleeding Sharlynn Oliphant, PharmD

## 2015-02-11 NOTE — Progress Notes (Signed)
ANTICOAGULATION CONSULT NOTE - Follow Up Consult  Pharmacy Consult for Heparin and Coumadin Indication: pulmonary embolus and DVT  Allergies  Allergen Reactions  . Ferrlecit [Na Ferric Gluc Cplx In Sucrose] Shortness Of Breath, Swelling and Other (See Comments)    Swelling in throat  . Darvocet [Propoxyphene N-Acetaminophen] Hives    Patient Measurements: Height: _0  (188 cm) Weight: 178 lb 12.7 oz (81.1 kg) IBW/kg (Calculated) : 82.2 Heparin Dosing Weight:   Vital Signs: Temp: 97.5 F (36.4 C) (07/23 0720) Temp Source: Oral (07/23 0720) BP: 128/87 mmHg (07/23 0930) Pulse Rate: 76 (07/23 0930)  Labs:  Recent Labs  02/10/15 2123 02/10/15 2136 02/11/15 0442 02/11/15 1014  HGB 9.0*  --  8.8*  --   HCT 27.8*  --  27.8*  --   PLT 229  --  214  --   LABPROT  --  18.1* 19.1*  --   INR  --  1.49 1.61*  --   HEPARINUNFRC  --   --   --  0.27*  CREATININE 10.75*  --  11.24*  --   TROPONINI  --   --  0.09*  --     Estimated Creatinine Clearance: 9 mL/min (by C-G formula based on Cr of 11.24).   Medications:  Scheduled:  . atorvastatin  40 mg Oral q1800  . carvedilol  25 mg Oral BID WC  . cinacalcet  30 mg Oral Q breakfast  . docusate sodium  100 mg Oral BID  . hydrALAZINE  50 mg Oral 3 times per day  . insulin aspart  0-5 Units Subcutaneous QHS  . insulin aspart  0-9 Units Subcutaneous TID WC  . multivitamin  1 tablet Oral QHS  . pantoprazole (PROTONIX) IV  40 mg Intravenous Q24H  . predniSONE  5 mg Oral Q breakfast  . sevelamer carbonate  1,600 mg Oral TID WC  . sodium chloride  3 mL Intravenous Q12H  . sodium polystyrene  60 g Oral Once  . Warfarin - Pharmacist Dosing Inpatient   Does not apply q1800    Assessment: 49yo male with recent LUE DVT, chronic PE, and chronic R-IJ clot, admitted last PM with subtherapeutic INR.  Pt now on heparin bridge to Coumadin.  HL 0.27, INR 1.61, Hg 8.8-stable, pltc wnl.  Pt received Coumadin 7.43m at 4AM today.  Goal of  Therapy:  INR 2-3 Heparin level 0.3-0.7 units/ml Monitor platelets by anticoagulation protocol: Yes   Plan:  Increase heparin to 2050 units/hr Repeat heparin level in 8hr Daily HL, CBC, INR Next Coumadin dose on 7/24.  KGracy Bruins PharmD Clinical Pharmacist [facility]

## 2015-02-11 NOTE — H&P (Addendum)
[facility] History and Physical  Frank [name] FRT:[id number] DOB: [birth year] DOA: 02/10/2015  Referring physician: ED physician PCP: Angelica Chessman, MD   Chief Complaint: Knot in stomach.   HPI:  Frank [name] is a 49yo man with extensive PMH, most importantly recent admission for LUE DVT per last discharge summary as well as chronic right IJ clot and chronic PE.  He also has ESRD and is on HD.  He was recently admitted as noted below.  He also complains of chronic chest pain.    Admitted 7/12 - 7/16 at [facility] - found to have LUE DVT.  On coumadin.  Continued to have CP, thought to be MSK in nature.  Also at that time with accelerated HTN due to noncompliance with medications.    Seen at [place] 02/07/15 - 02/09/15: Swelling in arm and hypertensive urgency.  Found to have chronic clot in the Right IJ. He was sent home with heparin injections b/c he insisted on leaving the hospital.  He was given coumadin as well.  He had HD twice while at [facility]. He had a subtherapeutic INR at the time of admission.  Per notes, he insisted on discharge and was given injectable heparin and coumadin to go home with.    Today, his main symptom was a knot in his stomach. He notes that he thinks the clot in his arm or neck moved into his lungs and then into his stomach.  He is very concerned that the clots are moving all over his body.  He told me that he had a syncopal episode today in his car and had to be pulled out of his car "by strangers."  He did not tell the same story to the ED providers.  He said the syncope was brought on by the clots moving in to his abdomen.  He has other chronic pains including in his right shoulder, which also "moves all around" and chest pain which is reproducible on palpation.  Further symptoms include fatigue, epigastric pain, decreased PO intake.  He reports taking his medications including injectable heparin given to him at discharge.    Objective findings in the ED showed  hyperkalemia with K of 6.6, Creatinine increase to 10.75.  Lipase was normal.  AST was elevated to 61 (3:1 ratio AST to ALT).  His INR was 1.49.  H/H were stable, chronically low.  Troponin I was 0.12, however, he has CKD.    Assessment and Plan:  Hyperkalemia in the setting of ESRD on dialysis - Received insulin, bicarb, calcium, glucose, refused kayexalate - Recheck BMET - Nephrology called by ED for dialysis  Left upper extremity deep vein thrombosis, RIJ thrombus - Per review of up to date, since his INR is low, he should be anticoagulated with IV therapy until INR therapeutic - Start heparin drip tonight - Pharmacy consult for coumadin  Abdominal pain, syncope - I am not sure what is causing his symptoms, epigastric/RUQ pain could be related to a gallstone, however, his LFTs do not show this - He has AST elevation, ? ETOH and possible withdrawal. Also possible withdrawal from opiates.  Another concern would be a gastric ulcer - Abdominal US ordered - Protonix ordered by IV for now - FOBT ordered for possible blood loss from GIT - ETOH level - Oxycodone IR for pain - If he begins vomiting, hold PO meds and start IV for pain management.  - Telemetry - Syncope work up to include: Telemetry, eval for ACS, pain  management and work up as noted.  I am not sure of his story, but he does claim "knowing it was coming."    Elevated Troponin - Likely in the setting of renal dysfunction, cycle enzymes - AM EKG    Chronic combined systolic and diastolic CHF (congestive heart failure) - Per previous notes, this is managed with HD - HD will be ordered by Nephrology    DM (diabetes mellitus), type 2, uncontrolled, with renal complications - SSI ordered    Anemia of chronic disease - FOBT ordered as noted above    HTN (hypertension) - Has previously had very high blood pressure, but today appears normalized.   - Restart coreg only at this time; hold amlodipine and lisinopril pending  HD - Restart these as BP improves.       Radiological Exams on Admission: Dg Chest 2 View  02/10/2015   CLINICAL DATA:  Acute onset of generalized chest pain and lethargy. Shortness of breath. Initial encounter.  EXAM: CHEST  2 VIEW  COMPARISON:  Chest radiograph and CTA of the chest performed 01/31/2015  FINDINGS: The lungs are well-aerated. Mild vascular congestion is noted. There is no evidence of focal opacification, pleural effusion or pneumothorax.  The heart is mildly enlarged. A right-sided dual-lumen catheter is noted ending within the right atrium. No acute osseous abnormalities are seen.  IMPRESSION: Mild vascular congestion and mild cardiomegaly noted. Lungs remain grossly clear.   Electronically Signed   By: Garald Balding M.D.   On: 02/10/2015 21:51     Code Status: Full Family Communication: Pt at bedside Disposition Plan: Admit for further evaluation    Gilles Chiquito, MD [phone]   Review of Systems:  Constitutional: + for malaise Negative for fever, chills. Negative for diaphoresis.  HENT: Negative for hearing loss, ear pain Eyes: Negative for blurred vision, double vision Respiratory: Negative for cough, hemoptysis, sputum production, shortness of breath Cardiovascular: + for chronic chest pain Negative for palpitations, orthopnea, claudication and leg swelling.  Gastrointestinal: + for nausea, abdominal pain Negative for vomiting and heartburn, constipation Genitourinary: Negative for dysuria, urgency, frequency Musculoskeletal: + for shoulder pain Negative for myalgias, back pain, joint pain and falls.  Skin: Negative for itching and rash.  Neurological: Negative for dizziness and weakness.    Past Medical History  Diagnosis Date  . Hypertension   . Depression   . Complication of anesthesia     itching, sore throat  . Diabetes mellitus without complication     No history per patient, but remains under history as A1c would not be accurate given on dialysis  .  Shortness of breath   . Anxiety   . ESRD (end stage renal disease)     due to HTN per patient, followed at [facility], s/p failed kidney transplant - dialysis Tue, Th, Sat  . Renal insufficiency   . CHF (congestive heart failure)   . Anemia   . Dialysis patient     Past Surgical History  Procedure Laterality Date  . Kidney receipient  2006    failed and started HD in March 2014  . Capd insertion    . Capd removal    . Left heart catheterization with coronary angiogram N/A 09/02/2014    Procedure: LEFT HEART CATHETERIZATION WITH CORONARY ANGIOGRAM;  Surgeon: [name], MD;  Location: [facility] CATH LAB;  Service: Cardiovascular;  Laterality: N/A;    Social History:  reports that he has quit smoking. His smoking use included Cigarettes. He has a  1 pack-year smoking history. He has never used smokeless tobacco. He reports that he does not drink alcohol or use illicit drugs.  Allergies  Allergen Reactions  . Ferrlecit [Na Ferric Gluc Cplx In Sucrose] Shortness Of Breath, Swelling and Other (See Comments)    Swelling in throat  . Darvocet [Propoxyphene N-Acetaminophen] Hives    No family history on file.  Prior to Admission medications   Medication Sig Start Date End Date Taking? Authorizing Provider  ALPRAZolam [name]) 0.5 MG tablet Take 0.5 mg by mouth 2 (two) times daily as needed for anxiety.    Historical Provider, MD  amLODipine (NORVASC) 10 MG tablet Take 10 mg by mouth daily.    Historical Provider, MD  atorvastatin (LIPITOR) 40 MG tablet Take 1 tablet (40 mg total) by mouth daily at 6 PM. 09/02/14   Barton Dubois, MD  carvedilol (COREG) 25 MG tablet Take 1 tablet (25 mg total) by mouth 2 (two) times daily with a meal. 10/10/14   [name], DO  cinacalcet (SENSIPAR) 30 MG tablet Take 30 mg by mouth daily.    Historical Provider, MD  diclofenac sodium (VOLTAREN) 1 % GEL Apply 2 g topically 4 (four) times daily. 02/04/15   [name], MD  dicyclomine (BENTYL) 20 MG tablet Take  1 tablet (20 mg total) by mouth 2 (two) times daily. 02/04/15   [name], MD  docusate sodium (COLACE) 100 MG capsule Take 1 capsule (100 mg total) by mouth 2 (two) times daily. 11/19/14   [name], MD  doxercalciferol (HECTOROL) 4 MCG/2ML injection Inject 1.25 mLs (2.5 mcg total) into the vein Every Tuesday,Thursday,and Saturday with dialysis. 10/10/14   [name], DO  hydrALAZINE (APRESOLINE) 50 MG tablet Take 1 tablet (50 mg total) by mouth every 8 (eight) hours. 02/04/15   [name], MD  hydrocerin (EUCERIN) CREA Apply 1 application topically 2 (two) times daily. 02/04/15   [name], MD  levalbuterol [facility] HFA) 45 MCG/ACT inhaler Inhale 2 puffs into the lungs every 6 (six) hours as needed for wheezing or shortness of breath. 11/19/14   [name], MD  lisinopril (PRINIVIL,ZESTRIL) 20 MG tablet Take 1 tablet (20 mg total) by mouth daily. 02/04/15   [name], MD  LORazepam (ATIVAN) 0.5 MG tablet Take 1 tablet (0.5 mg total) by mouth at bedtime as needed for anxiety. Patient not taking: Reported on 02/01/2015 11/19/14   [name], MD  methocarbamol (ROBAXIN) 500 MG tablet Take 2 tablets (1,000 mg total) by mouth every 8 (eight) hours as needed for muscle spasms. 01/26/15   [name], MD  multivitamin (RENA-VIT) TABS tablet Take 1 tablet by mouth at bedtime. 02/04/15   [name], MD  omeprazole (PRILOSEC) 20 MG capsule Take 20 mg by mouth daily. 07/01/13   Historical Provider, MD  ondansetron (ZOFRAN-ODT) 4 MG disintegrating tablet Take 1 tablet (4 mg total) by mouth every 8 (eight) hours as needed for nausea or vomiting. 12/05/14   Verlee Monte, MD  oxycodone (OXY-IR) 5 MG capsule Take 10 mg by mouth every 4 (four) hours as needed for pain.    Historical Provider, MD  predniSONE (DELTASONE) 5 MG tablet Take 5 mg by mouth daily with breakfast.    Historical Provider, MD  sevelamer carbonate (RENVELA) 800 MG tablet Take 800-1,600 mg by mouth 3 (three) times daily  with meals. 2 tabs three times daily with meals, and 1 tablet with snacks  Historical Provider, MD  warfarin (COUMADIN) 4 MG tablet Take 1 tablet (4 mg total) by mouth daily at 6 PM. 12/05/14   Verlee Monte, MD    Physical Exam: Filed Vitals:   02/10/15 2357 02/11/15 0000 02/11/15 0015 02/11/15 0030  BP: 126/84 130/79 124/78 138/89  Pulse: 75 73 79 80  Temp:      TempSrc:      Resp: 17 0 0 0  Weight:      SpO2: 100% 98% 97% 98%    Physical Exam  Constitutional: Appears disheveled, speaking very quietly HENT: Normocephalic.  Eyes: Conjunctivae are normal.  No scleral icterus.  Neck: Normal ROM. Neck supple. CVS: RR, NR, S1/S2 +, no murmurs Pulmonary: Effort and breath sounds normal Abdominal: + pain to palpation in epigastrium and RUQ.  Soft. BS +,  Mild distension Neuro: Alert. No cranial nerve deficit. Skin: Skin is warm and dry. No rash noted.   Labs on Admission:  Basic Metabolic Panel:  Recent Labs Lab 02/04/15 0731 02/10/15 2123  NA 135 130*  K 4.3 6.6*  CL 102 97*  CO2 25 24  GLUCOSE 139* 86  BUN 46* 46*  CREATININE 8.41* 10.75*  CALCIUM 7.4* 7.9*  PHOS 2.8  --    Liver Function Tests:  Recent Labs Lab 02/04/15 0731 02/10/15 2136  AST  --  61*  ALT  --  17  ALKPHOS  --  98  BILITOT  --  0.6  PROT  --  6.4*  ALBUMIN 1.7* 2.2*    Recent Labs Lab 02/10/15 2136  LIPASE 17*   CBC:  Recent Labs Lab 02/04/15 0731 02/10/15 2123  WBC 6.9 9.6  HGB 8.2* 9.0*  HCT 26.0* 27.8*  MCV 83.1 84.5  PLT 222 229   CBG:  Recent Labs Lab 02/04/15 1150  GLUCAP 101*    EKG: Normal sinus rhythm, repol abnormality seen, apparent on previous EKGs.    If 7PM-7AM, please contact night-coverage [email or web address] Password [facility] 02/11/2015, 12:58 AM

## 2015-02-11 NOTE — Progress Notes (Addendum)
New Admission Note:   Arrival Method: via stretcher from ED  Mental Orientation: A&Ox4 Telemetry: NSR Assessment: Completed Skin: dry and flaky IV: Hep drip _0  Pain: Right shoulder 7/10  Tubes: RLQ PD CATHETER Safety Measures: Safety Fall Prevention Plan has been given, discussed and signed Admission: Completed [facility] [place] Orientation: Patient has been orientated to the room, unit and staff.  Family: NONE PRESENT  Orders have been reviewed and implemented. Will continue to monitor the patient. Call light has been placed within reach and bed alarm has been activated.   Dudley Major BSN, Consulting civil engineer number: [phone]

## 2015-02-11 NOTE — ED Notes (Signed)
MD notified of hypoglycemia; Orange juice provided

## 2015-02-11 NOTE — Procedures (Signed)
I was present at this dialysis session. I have reviewed the session itself and made appropriate changes.   Pearson Grippe  MD 02/11/2015, 8:08 AM

## 2015-02-11 NOTE — Progress Notes (Signed)
Patient seen and examined this morning  INR is still subtherapeutic, given ESRD no other good anticoagulation options,recent LUE DVT, chronic PE, and chronic R-IJ clot  Continue Coumadin, continue heparin drip,  Hemodialysis today,  Negative abdominal ultrasound, abdominal discomfort likely secondary to constipation, start patient on aggressive constipation protocol

## 2015-02-11 NOTE — Consult Note (Signed)
Frank [name] 02/11/2015 Rexene Agent Requesting Physician:  Daryll Drown MD  Reason for Consult:  ESRD Hyperkalemia HPI:  52M ESRD, nonadherence, failed renal Tx, HTN, anemia, recent hx/o LUE DVT presented to ED overnight with dyspnea, abd pain, and found to have mild hyperkalemia.  Says he was worried about blood clot traveling to cut off his breathing, req assistance of strangers and then drove to ED.  Rec medical management but refused kayexalate.  K was 6.6, now 5.6.  Was at [facility] 7/19-7/21 and was dc from [facility] 7/16.  Admitted to [facility]    Outpt [facility] is [place].      Filed Weights   02/10/15 2128 02/11/15 0233  Weight: 81.194 kg (179 lb) 79.1 kg (174 lb 6.1 oz)       ROS Balance of 12 systems is negative w/ exceptions as above  Outpt Dialysis Orders: [place] [facility] on [address] 4h 170 lbs- gets to EDW 2/2.5 bath ; not on ESA or Fe- see above  PMH  Past Medical History  Diagnosis Date  . Hypertension   . Depression   . Complication of anesthesia     itching, sore throat  . Diabetes mellitus without complication     No history per patient, but remains under history as A1c would not be accurate given on dialysis  . Shortness of breath   . Anxiety   . ESRD (end stage renal disease)     due to HTN per patient, followed at [facility], s/p failed kidney transplant - dialysis Tue, Th, Sat  . Renal insufficiency   . CHF (congestive heart failure)   . Anemia   . Dialysis patient    Nowthen  Past Surgical History  Procedure Laterality Date  . Kidney receipient  2006    failed and started HD in March 2014  . Capd insertion    . Capd removal    . Left heart catheterization with coronary angiogram N/A 09/02/2014    Procedure: LEFT HEART CATHETERIZATION WITH CORONARY ANGIOGRAM;  Surgeon: [name], MD;  Location: [facility] CATH LAB;  Service: Cardiovascular;  Laterality: N/A;   FH No family history on file. SH  reports that he has quit smoking. His smoking use included Cigarettes.  He has a 1 pack-year smoking history. He has never used smokeless tobacco. He reports that he does not drink alcohol or use illicit drugs. Allergies  Allergies  Allergen Reactions  . Ferrlecit [Na Ferric Gluc Cplx In Sucrose] Shortness Of Breath, Swelling and Other (See Comments)    Swelling in throat  . Darvocet [Propoxyphene N-Acetaminophen] Hives   Home medications Prior to Admission medications   Medication Sig Start Date End Date Taking? Authorizing Provider  ALPRAZolam [name]) 0.5 MG tablet Take 0.5 mg by mouth 2 (two) times daily as needed for anxiety.    Historical Provider, MD  amLODipine (NORVASC) 10 MG tablet Take 10 mg by mouth daily.    Historical Provider, MD  atorvastatin (LIPITOR) 40 MG tablet Take 1 tablet (40 mg total) by mouth daily at 6 PM. 09/02/14   Barton Dubois, MD  carvedilol (COREG) 25 MG tablet Take 1 tablet (25 mg total) by mouth 2 (two) times daily with a meal. 10/10/14   [name], DO  cinacalcet (SENSIPAR) 30 MG tablet Take 30 mg by mouth daily.    Historical Provider, MD  diclofenac sodium (VOLTAREN) 1 % GEL Apply 2 g topically 4 (four) times daily. 02/04/15   [name],  MD  dicyclomine (BENTYL) 20 MG tablet Take 1 tablet (20 mg total) by mouth 2 (two) times daily. 02/04/15   [name], MD  docusate sodium (COLACE) 100 MG capsule Take 1 capsule (100 mg total) by mouth 2 (two) times daily. 11/19/14   [name], MD  doxercalciferol (HECTOROL) 4 MCG/2ML injection Inject 1.25 mLs (2.5 mcg total) into the vein Every Tuesday,Thursday,and Saturday with dialysis. 10/10/14   [name], DO  hydrALAZINE (APRESOLINE) 50 MG tablet Take 1 tablet (50 mg total) by mouth every 8 (eight) hours. 02/04/15   [name], MD  hydrocerin (EUCERIN) CREA Apply 1 application topically 2 (two) times daily. 02/04/15   [name], MD  levalbuterol [facility] HFA) 45 MCG/ACT inhaler Inhale 2 puffs into the lungs every 6 (six) hours as needed for wheezing or shortness of  breath. 11/19/14   [name], MD  lisinopril (PRINIVIL,ZESTRIL) 20 MG tablet Take 1 tablet (20 mg total) by mouth daily. 02/04/15   [name], MD  LORazepam (ATIVAN) 0.5 MG tablet Take 1 tablet (0.5 mg total) by mouth at bedtime as needed for anxiety. Patient not taking: Reported on 02/01/2015 11/19/14   [name], MD  methocarbamol (ROBAXIN) 500 MG tablet Take 2 tablets (1,000 mg total) by mouth every 8 (eight) hours as needed for muscle spasms. 01/26/15   [name], MD  multivitamin (RENA-VIT) TABS tablet Take 1 tablet by mouth at bedtime. 02/04/15   [name], MD  omeprazole (PRILOSEC) 20 MG capsule Take 20 mg by mouth daily. 07/01/13   Historical Provider, MD  ondansetron (ZOFRAN-ODT) 4 MG disintegrating tablet Take 1 tablet (4 mg total) by mouth every 8 (eight) hours as needed for nausea or vomiting. 12/05/14   Verlee Monte, MD  oxycodone (OXY-IR) 5 MG capsule Take 10 mg by mouth every 4 (four) hours as needed for pain.    Historical Provider, MD  predniSONE (DELTASONE) 5 MG tablet Take 5 mg by mouth daily with breakfast.    Historical Provider, MD  sevelamer carbonate (RENVELA) 800 MG tablet Take 800-1,600 mg by mouth 3 (three) times daily with meals. 2 tabs three times daily with meals, and 1 tablet with snacks    Historical Provider, MD  warfarin (COUMADIN) 4 MG tablet Take 1 tablet (4 mg total) by mouth daily at 6 PM. 12/05/14   Verlee Monte, MD    Current Medications Scheduled Meds: . atorvastatin  40 mg Oral q1800  . carvedilol  25 mg Oral BID WC  . cinacalcet  30 mg Oral Q breakfast  . docusate sodium  100 mg Oral BID  . hydrALAZINE  50 mg Oral 3 times per day  . insulin aspart  0-5 Units Subcutaneous QHS  . insulin aspart  0-9 Units Subcutaneous TID WC  . multivitamin  1 tablet Oral QHS  . pantoprazole (PROTONIX) IV  40 mg Intravenous Q24H  . predniSONE  5 mg Oral Q breakfast  . sevelamer carbonate  1,600 mg Oral TID WC  . sodium chloride  3 mL Intravenous Q12H   . sodium polystyrene  60 g Oral Once  . Warfarin - Pharmacist Dosing Inpatient   Does not apply q1800   Continuous Infusions: . heparin 1,900 Units/hr (02/11/15 0357)   PRN Meds:.sodium chloride, sodium chloride, ALPRAZolam, alteplase, alum & mag hydroxide-simeth, heparin, heparin, levalbuterol, lidocaine (PF), lidocaine-prilocaine, ondansetron, oxyCODONE, pentafluoroprop-tetrafluoroeth, sevelamer carbonate  CBC  Recent Labs Lab 02/04/15 0731 02/10/15 2123 02/11/15 0442  WBC 6.9 9.6 10.8*  HGB 8.2* 9.0* 8.8*  HCT 26.0* 27.8* 27.8*  MCV 83.1 84.5 84.2  PLT 222 229 219   Basic Metabolic Panel  Recent Labs Lab 02/04/15 0731 02/10/15 2123 02/11/15 0442  NA 135 130* 131*  K 4.3 6.6* 5.6*  CL 102 97* 96*  CO2 _0 GLUCOSE 139* 86 73  BUN 46* 46* 51*  CREATININE 8.41* 10.75* 11.24*  CALCIUM 7.4* 7.9* 8.0*  PHOS 2.8  --   --     Physical Exam  Blood pressure 135/97, pulse 76, temperature 98 F (36.7 C), temperature source Oral, resp. rate 19, height _1  (1.88 m), weight 79.1 kg (174 lb 6.1 oz), SpO2 100 %. GEN: NAD, on RA breathing comfortably ENT: NCAT EYES: EOMI CV: RRR, no rub, nl s1s2 PULM: CTAB ABD: s/nt/nd SKIN: R IJ TDC CDK EXT:no LEE   A/P 1. ESRD:  1. THS at [facility] using [facility] 2. Not currently a candidate to transfer to [facility] as has been addressed numerous times 3. After HD today, safe for return to outpt HD, no further inpatient renal issues 2. HTN/Vol:  1. 4L UF goal today 2. Bp stable right now 3. Hyperkalemia: 1. Improved with medical management 2. HD this AM 4. Anemia:  1. 2/2 no ESA from nonadherence 2. Needs to go to dialysis; can't address appropriately on hospital based urgent HD 5. MBD: stable currently, to be addressed as outpt 6. Abd Pain: per primary 7. UE DVT on heparin gtt and warfarin; Per primary  Pearson Grippe MD 02/11/2015, 7:06 AM

## 2015-02-11 NOTE — Progress Notes (Signed)
02/11/15  Pharmacy- Heparin 2015   Heparin level 0.19 on 2050 units/hr  A/P:  49yo male on heparin bridge to Coumadin for recent and chronic VTEs.  Heparin level fell despite increase in rate today.  Heparin was not off at any time today per d/w floor and HD nurses.  No bleeding noted.  1.  Heparin 2000 unit IV x 1 and increase to 2250 units/hr 2.  Repeat HL 8hr  Gracy Bruins, PharmD Clinical Pharmacist [facility]

## 2015-02-11 NOTE — ED Notes (Signed)
Daryll Drown, MD at bedside.

## 2015-02-12 ENCOUNTER — Inpatient Hospital Stay (HOSPITAL_COMMUNITY): Payer: Medicare Other

## 2015-02-12 LAB — COMPREHENSIVE METABOLIC PANEL
ALK PHOS: 85 U/L (ref 38–126)
ALT: 15 U/L — AB (ref 17–63)
ANION GAP: 8 (ref 5–15)
AST: 52 U/L — ABNORMAL HIGH (ref 15–41)
Albumin: 1.8 g/dL — ABNORMAL LOW (ref 3.5–5.0)
BUN: 35 mg/dL — AB (ref 6–20)
CHLORIDE: 97 mmol/L — AB (ref 101–111)
CO2: 29 mmol/L (ref 22–32)
Calcium: 7.6 mg/dL — ABNORMAL LOW (ref 8.9–10.3)
Creatinine, Ser: 8.09 mg/dL — ABNORMAL HIGH (ref 0.61–1.24)
GFR calc non Af Amer: 7 mL/min — ABNORMAL LOW (ref >60)
GFR, EST AFRICAN AMERICAN: 8 mL/min — AB (ref >60)
GLUCOSE: 134 mg/dL — AB (ref 65–99)
POTASSIUM: 4.8 mmol/L (ref 3.5–5.1)
Sodium: 134 mmol/L — ABNORMAL LOW (ref 135–145)
Total Bilirubin: 0.5 mg/dL (ref 0.3–1.2)
Total Protein: 5.9 g/dL — ABNORMAL LOW (ref 6.5–8.1)

## 2015-02-12 LAB — GLUCOSE, CAPILLARY
GLUCOSE-CAPILLARY: 111 mg/dL — AB (ref 65–99)
Glucose-Capillary: 135 mg/dL — ABNORMAL HIGH (ref 65–99)
Glucose-Capillary: 79 mg/dL (ref 65–99)
Glucose-Capillary: 120 mg/dL — ABNORMAL HIGH (ref 65–99)

## 2015-02-12 LAB — CBC
HCT: 24.8 % — ABNORMAL LOW (ref 39.0–52.0)
HEMOGLOBIN: 8 g/dL — AB (ref 13.0–17.0)
MCH: 27.4 pg (ref 26.0–34.0)
MCHC: 32.3 g/dL (ref 30.0–36.0)
MCV: 84.9 fL (ref 78.0–100.0)
Platelets: 184 10*3/uL (ref 150–400)
RBC: 2.92 MIL/uL — ABNORMAL LOW (ref 4.22–5.81)
RDW: 19 % — ABNORMAL HIGH (ref 11.5–15.5)
WBC: 7.4 10*3/uL (ref 4.0–10.5)

## 2015-02-12 LAB — TYPE AND SCREEN
ABO/RH(D): B POS
Antibody Screen: NEGATIVE

## 2015-02-12 LAB — PROTIME-INR
INR: 2.02 — ABNORMAL HIGH (ref 0.00–1.49)
INR: 1.52 — AB (ref 0.00–1.49)
PROTHROMBIN TIME: 18.3 s — AB (ref 11.6–15.2)
Prothrombin Time: 22.8 seconds — ABNORMAL HIGH (ref 11.6–15.2)

## 2015-02-12 MED ORDER — HEPARIN (PORCINE) IN NACL 100-0.45 UNIT/ML-% IJ SOLN
2200.0000 [IU]/h | INTRAMUSCULAR | Status: AC
  Administered 2015-02-12: 2200 [IU]/h via INTRAVENOUS
  Filled 2015-02-12: qty 250

## 2015-02-12 MED ORDER — WARFARIN SODIUM 7.5 MG PO TABS: 7.5000 mg | ORAL_TABLET | Freq: Once | ORAL | Status: DC

## 2015-02-12 MED ORDER — VITAMIN K1 10 MG/ML IJ SOLN
10.0000 mg | Freq: Once | INTRAVENOUS | Status: AC
  Administered 2015-02-12: 10 mg via INTRAVENOUS
  Filled 2015-02-12: qty 1

## 2015-02-12 MED ORDER — SODIUM CHLORIDE 0.9 % IV SOLN: Freq: Once | INTRAVENOUS | Status: DC

## 2015-02-12 NOTE — Consult Note (Signed)
Reason for Consult:right groin pain Referring Physician: Dr Marlane Hatcher is an 49 y.o. male.  HPI: 62 yom admitted for sob on esrd.  Multiple medical issues.  He had been having some abd pain recently which is not present today. Has xrays and an [place] that are really negative. Today he noted onset of right groin pain. He states this has never been present before.  No n/v. Ate some breakfast this am.  He is on coumadin with inr of 2 due to vte.  Asked to see him to rule out hernia.   Past Medical History  Diagnosis Date  . Hypertension   . Depression   . Complication of anesthesia     itching, sore throat  . Diabetes mellitus without complication     No history per patient, but remains under history as A1c would not be accurate given on dialysis  . Shortness of breath   . Anxiety   . ESRD (end stage renal disease)     due to HTN per patient, followed at [facility], s/p failed kidney transplant - dialysis Tue, Th, Sat  . Renal insufficiency   . CHF (congestive heart failure)   . Anemia   . Dialysis patient     Past Surgical History  Procedure Laterality Date  . Kidney receipient  2006    failed and started HD in March 2014  . Capd insertion    . Capd removal    . Left heart catheterization with coronary angiogram N/A 09/02/2014    Procedure: LEFT HEART CATHETERIZATION WITH CORONARY ANGIOGRAM;  Surgeon: [name], MD;  Location: [facility] CATH LAB;  Service: Cardiovascular;  Laterality: N/A;    No family history on file.  Social History:  reports that he has quit smoking. His smoking use included Cigarettes. He has a 1 pack-year smoking history. He has never used smokeless tobacco. He reports that he does not drink alcohol or use illicit drugs.  Allergies:  Allergies  Allergen Reactions  . Ferrlecit [Na Ferric Gluc Cplx In Sucrose] Shortness Of Breath, Swelling and Other (See Comments)    Swelling in throat  . Darvocet [Propoxyphene N-Acetaminophen] Hives    Medications: I  have reviewed the patient's current medications.  Results for orders placed or performed during the hospital encounter of 02/10/15 (from the past 48 hour(s))  Basic metabolic panel     Status: Abnormal   Collection Time: 02/10/15  9:23 PM  Result Value Ref Range   Sodium 130 (L) 135 - 145 mmol/L   Potassium 6.6 (HH) 3.5 - 5.1 mmol/L    Comment: REPEATED TO VERIFY CRITICAL RESULT CALLED TO, READ BACK BY AND VERIFIED WITH: MUNNETT [facility] 02/10/15 2225 WAYK    Chloride 97 (L) 101 - 111 mmol/L   CO2 24 22 - 32 mmol/L   Glucose, Bld 86 65 - 99 mg/dL   BUN 46 (H) 6 - 20 mg/dL   Creatinine, Ser 10.75 (H) 0.61 - 1.24 mg/dL   Calcium 7.9 (L) 8.9 - 10.3 mg/dL   GFR calc non Af Amer 5 (L) >60 mL/min   GFR calc Af Amer 6 (L) >60 mL/min    Comment: (NOTE) The eGFR has been calculated using the CKD EPI equation. This calculation has not been validated in all clinical situations. eGFR's persistently <60 mL/min signify possible Chronic Kidney Disease.    Anion gap 9 5 - 15  CBC     Status: Abnormal   Collection Time: 02/10/15  9:23 PM  Result Value Ref Range   WBC 9.6 4.0 - 10.5 K/uL   RBC 3.29 (L) 4.22 - 5.81 MIL/uL   Hemoglobin 9.0 (L) 13.0 - 17.0 g/dL   HCT 27.8 (L) 39.0 - 52.0 %   MCV 84.5 78.0 - 100.0 fL   MCH 27.4 26.0 - 34.0 pg   MCHC 32.4 30.0 - 36.0 g/dL   RDW 18.9 (H) 11.5 - 15.5 %   Platelets 229 150 - 400 K/uL  I-stat troponin, ED     Status: Abnormal   Collection Time: 02/10/15  9:28 PM  Result Value Ref Range   Troponin i, poc 0.12 (HH) 0.00 - 0.08 ng/mL   Comment NOTIFIED PHYSICIAN    Comment 3            Comment: Due to the release kinetics of cTnI, a negative result within the first hours of the onset of symptoms does not rule out myocardial infarction with certainty. If myocardial infarction is still suspected, repeat the test at appropriate intervals.   Protime-INR     Status: Abnormal   Collection Time: 02/10/15  9:36 PM  Result Value Ref Range   Prothrombin  Time 18.1 (H) 11.6 - 15.2 seconds   INR 1.49 0.00 - 1.49  Hepatic function panel     Status: Abnormal   Collection Time: 02/10/15  9:36 PM  Result Value Ref Range   Total Protein 6.4 (L) 6.5 - 8.1 g/dL   Albumin 2.2 (L) 3.5 - 5.0 g/dL   AST 61 (H) 15 - 41 U/L   ALT 17 17 - 63 U/L   Alkaline Phosphatase 98 38 - 126 U/L   Total Bilirubin 0.6 0.3 - 1.2 mg/dL   Bilirubin, Direct 0.1 0.1 - 0.5 mg/dL   Indirect Bilirubin 0.5 0.3 - 0.9 mg/dL  Lipase, blood     Status: Abnormal   Collection Time: 02/10/15  9:36 PM  Result Value Ref Range   Lipase 17 (L) 22 - 51 U/L  CBG monitoring, ED     Status: Abnormal   Collection Time: 02/11/15  1:01 AM  Result Value Ref Range   Glucose-Capillary 47 (L) 65 - 99 mg/dL  CBG monitoring, ED     Status: Abnormal   Collection Time: 02/11/15  1:25 AM  Result Value Ref Range   Glucose-Capillary 40 (LL) 65 - 99 mg/dL   Comment 1 Notify RN    Comment 2 Document in Chart   CBG monitoring, ED     Status: Abnormal   Collection Time: 02/11/15  1:54 AM  Result Value Ref Range   Glucose-Capillary 116 (H) 65 - 99 mg/dL  Glucose, capillary     Status: None   Collection Time: 02/11/15  2:36 AM  Result Value Ref Range   Glucose-Capillary 78 65 - 99 mg/dL  Comprehensive metabolic panel     Status: Abnormal   Collection Time: 02/11/15  4:42 AM  Result Value Ref Range   Sodium 131 (L) 135 - 145 mmol/L   Potassium 5.6 (H) 3.5 - 5.1 mmol/L   Chloride 96 (L) 101 - 111 mmol/L   CO2 25 22 - 32 mmol/L   Glucose, Bld 73 65 - 99 mg/dL   BUN 51 (H) 6 - 20 mg/dL   Creatinine, Ser 11.24 (H) 0.61 - 1.24 mg/dL   Calcium 8.0 (L) 8.9 - 10.3 mg/dL   Total Protein 6.1 (L) 6.5 - 8.1 g/dL   Albumin 2.1 (L) 3.5 - 5.0 g/dL   AST 63 (  H) 15 - 41 U/L   ALT 20 17 - 63 U/L   Alkaline Phosphatase 95 38 - 126 U/L   Total Bilirubin 0.8 0.3 - 1.2 mg/dL   GFR calc non Af Amer 5 (L) >60 mL/min   GFR calc Af Amer 5 (L) >60 mL/min    Comment: (NOTE) The eGFR has been calculated using the  CKD EPI equation. This calculation has not been validated in all clinical situations. eGFR's persistently <60 mL/min signify possible Chronic Kidney Disease.    Anion gap 10 5 - 15  CBC     Status: Abnormal   Collection Time: 02/11/15  4:42 AM  Result Value Ref Range   WBC 10.8 (H) 4.0 - 10.5 K/uL   RBC 3.30 (L) 4.22 - 5.81 MIL/uL   Hemoglobin 8.8 (L) 13.0 - 17.0 g/dL   HCT 27.8 (L) 39.0 - 52.0 %   MCV 84.2 78.0 - 100.0 fL   MCH 26.7 26.0 - 34.0 pg   MCHC 31.7 30.0 - 36.0 g/dL   RDW 19.0 (H) 11.5 - 15.5 %   Platelets 214 150 - 400 K/uL  Protime-INR     Status: Abnormal   Collection Time: 02/11/15  4:42 AM  Result Value Ref Range   Prothrombin Time 19.1 (H) 11.6 - 15.2 seconds   INR 1.61 (H) 0.00 - 1.49  Ethanol     Status: None   Collection Time: 02/11/15  4:42 AM  Result Value Ref Range   Alcohol, Ethyl (B) <5 <5 mg/dL    Comment:        LOWEST DETECTABLE LIMIT FOR SERUM ALCOHOL IS 5 mg/dL FOR MEDICAL PURPOSES ONLY   Troponin I (q 6hr x 3)     Status: Abnormal   Collection Time: 02/11/15  4:42 AM  Result Value Ref Range   Troponin I 0.09 (H) <0.031 ng/mL    Comment:        PERSISTENTLY INCREASED TROPONIN VALUES IN THE RANGE OF 0.04-0.49 ng/mL CAN BE SEEN IN:       -UNSTABLE ANGINA       -CONGESTIVE HEART FAILURE       -MYOCARDITIS       -CHEST TRAUMA       -ARRYHTHMIAS       -LATE PRESENTING MYOCARDIAL INFARCTION       -COPD   CLINICAL FOLLOW-UP RECOMMENDED.   Heparin level (unfractionated)     Status: Abnormal   Collection Time: 02/11/15 10:14 AM  Result Value Ref Range   Heparin Unfractionated 0.27 (L) 0.30 - 0.70 IU/mL    Comment:        IF HEPARIN RESULTS ARE BELOW EXPECTED VALUES, AND PATIENT DOSAGE HAS BEEN CONFIRMED, SUGGEST FOLLOW UP TESTING OF ANTITHROMBIN III LEVELS.   Glucose, capillary     Status: None   Collection Time: 02/11/15 12:16 PM  Result Value Ref Range   Glucose-Capillary 73 65 - 99 mg/dL  Glucose, capillary     Status: Abnormal    Collection Time: 02/11/15  4:35 PM  Result Value Ref Range   Glucose-Capillary 136 (H) 65 - 99 mg/dL  Troponin I (q 6hr x 3)     Status: Abnormal   Collection Time: 02/11/15  6:33 PM  Result Value Ref Range   Troponin I 0.07 (H) <0.031 ng/mL    Comment:        PERSISTENTLY INCREASED TROPONIN VALUES IN THE RANGE OF 0.04-0.49 ng/mL CAN BE SEEN IN:       -UNSTABLE  ANGINA       -CONGESTIVE HEART FAILURE       -MYOCARDITIS       -CHEST TRAUMA       -ARRYHTHMIAS       -LATE PRESENTING MYOCARDIAL INFARCTION       -COPD   CLINICAL FOLLOW-UP RECOMMENDED.   Renal function panel     Status: Abnormal   Collection Time: 02/11/15  6:33 PM  Result Value Ref Range   Sodium 133 (L) 135 - 145 mmol/L   Potassium 4.9 3.5 - 5.1 mmol/L   Chloride 97 (L) 101 - 111 mmol/L   CO2 28 22 - 32 mmol/L   Glucose, Bld 111 (H) 65 - 99 mg/dL   BUN 27 (H) 6 - 20 mg/dL   Creatinine, Ser 6.77 (H) 0.61 - 1.24 mg/dL   Calcium 7.3 (L) 8.9 - 10.3 mg/dL   Phosphorus 4.1 2.5 - 4.6 mg/dL   Albumin 1.9 (L) 3.5 - 5.0 g/dL   GFR calc non Af Amer 9 (L) >60 mL/min   GFR calc Af Amer 10 (L) >60 mL/min    Comment: (NOTE) The eGFR has been calculated using the CKD EPI equation. This calculation has not been validated in all clinical situations. eGFR's persistently <60 mL/min signify possible Chronic Kidney Disease.    Anion gap 8 5 - 15  Heparin level (unfractionated)     Status: Abnormal   Collection Time: 02/11/15  6:33 PM  Result Value Ref Range   Heparin Unfractionated 0.19 (L) 0.30 - 0.70 IU/mL    Comment:        IF HEPARIN RESULTS ARE BELOW EXPECTED VALUES, AND PATIENT DOSAGE HAS BEEN CONFIRMED, SUGGEST FOLLOW UP TESTING OF ANTITHROMBIN III LEVELS.   Glucose, capillary     Status: Abnormal   Collection Time: 02/11/15  9:25 PM  Result Value Ref Range   Glucose-Capillary 136 (H) 65 - 99 mg/dL  CBC     Status: Abnormal   Collection Time: 02/12/15  6:06 AM  Result Value Ref Range   WBC 7.4 4.0 - 10.5  K/uL   RBC 2.92 (L) 4.22 - 5.81 MIL/uL   Hemoglobin 8.0 (L) 13.0 - 17.0 g/dL   HCT 24.8 (L) 39.0 - 52.0 %   MCV 84.9 78.0 - 100.0 fL   MCH 27.4 26.0 - 34.0 pg   MCHC 32.3 30.0 - 36.0 g/dL   RDW 19.0 (H) 11.5 - 15.5 %   Platelets 184 150 - 400 K/uL  Comprehensive metabolic panel     Status: Abnormal   Collection Time: 02/12/15  6:06 AM  Result Value Ref Range   Sodium 134 (L) 135 - 145 mmol/L   Potassium 4.8 3.5 - 5.1 mmol/L   Chloride 97 (L) 101 - 111 mmol/L   CO2 29 22 - 32 mmol/L   Glucose, Bld 134 (H) 65 - 99 mg/dL   BUN 35 (H) 6 - 20 mg/dL   Creatinine, Ser 8.09 (H) 0.61 - 1.24 mg/dL   Calcium 7.6 (L) 8.9 - 10.3 mg/dL   Total Protein 5.9 (L) 6.5 - 8.1 g/dL   Albumin 1.8 (L) 3.5 - 5.0 g/dL   AST 52 (H) 15 - 41 U/L   ALT 15 (L) 17 - 63 U/L   Alkaline Phosphatase 85 38 - 126 U/L   Total Bilirubin 0.5 0.3 - 1.2 mg/dL   GFR calc non Af Amer 7 (L) >60 mL/min   GFR calc Af Amer 8 (L) >60 mL/min  Comment: (NOTE) The eGFR has been calculated using the CKD EPI equation. This calculation has not been validated in all clinical situations. eGFR's persistently <60 mL/min signify possible Chronic Kidney Disease.    Anion gap 8 5 - 15  Protime-INR     Status: Abnormal   Collection Time: 02/12/15  6:06 AM  Result Value Ref Range   Prothrombin Time 22.8 (H) 11.6 - 15.2 seconds   INR 2.02 (H) 0.00 - 1.49  Glucose, capillary     Status: Abnormal   Collection Time: 02/12/15  7:33 AM  Result Value Ref Range   Glucose-Capillary 135 (H) 65 - 99 mg/dL    Dg Chest 2 View  02/10/2015   CLINICAL DATA:  Acute onset of generalized chest pain and lethargy. Shortness of breath. Initial encounter.  EXAM: CHEST  2 VIEW  COMPARISON:  Chest radiograph and CTA of the chest performed 01/31/2015  FINDINGS: The lungs are well-aerated. Mild vascular congestion is noted. There is no evidence of focal opacification, pleural effusion or pneumothorax.  The heart is mildly enlarged. A right-sided dual-lumen  catheter is noted ending within the right atrium. No acute osseous abnormalities are seen.  IMPRESSION: Mild vascular congestion and mild cardiomegaly noted. Lungs remain grossly clear.   Electronically Signed   By: Garald Balding M.D.   On: 02/10/2015 21:51   Abd 1 View (kub)  02/11/2015   CLINICAL DATA:  Acute onset of generalized abdominal pain. Initial encounter.  EXAM: ABDOMEN - 1 VIEW  COMPARISON:  CT of the abdomen and pelvis from 11/05/2014, and abdominal radiograph performed 12/02/2014  FINDINGS: The visualized bowel gas pattern is unremarkable. Scattered air and stool filled loops of colon are seen; no abnormal dilatation of small bowel loops is seen to suggest small bowel obstruction. No free intra-abdominal air is identified, though evaluation for free air is limited on a single supine view.  The visualized osseous structures are within normal limits; the sacroiliac joints are unremarkable in appearance. A dialysis catheter is noted overlying the pelvis. The visualized lung bases are essentially clear.  IMPRESSION: Unremarkable bowel gas pattern; no free intra-abdominal air seen. Moderate amount of stool noted in the colon.   Electronically Signed   By: Garald Balding M.D.   On: 02/11/2015 04:14   US Abdomen Complete  02/11/2015   CLINICAL DATA:  Acute onset of midline abdominal pain. Initial encounter.  EXAM: ULTRASOUND ABDOMEN COMPLETE  COMPARISON:  CT of the abdomen and pelvis performed 11/05/2014  FINDINGS: Gallbladder: No gallstones or wall thickening visualized. No sonographic Murphy sign noted.  Common bile duct: Diameter: 0.4 cm, within normal limits in caliber.  Liver: No focal lesion identified. Within normal limits in parenchymal echogenicity.  IVC: No abnormality visualized.  Pancreas: Visualized portion unremarkable.  Spleen: Not characterized on this study.  Right Kidney: Length: 7.3 cm. Diffusely increased parenchymal echogenicity, with mild atrophy. A small 1.4 cm cyst is noted at  the upper pole of the right kidney. No hydronephrosis visualized.  Left Kidney:  The native left kidney is not visualized.  Transplant Kidney: Length: 10.6 cm. Echogenicity within normal limits. No mass or hydronephrosis visualized.  Abdominal aorta: No aneurysm visualized.  Other findings: A small amount of ascites is noted at the left lower quadrant, with adjacent small bowel loops.  IMPRESSION: 1. No acute abnormality seen to explain the patient's symptoms. 2. Mild atrophy of the native right kidney, with findings of medical renal disease. Native left kidney not visualized. Transplant kidney is unremarkable  in appearance. 3. Small amount of ascites at the left lower quadrant.   Electronically Signed   By: Garald Balding M.D.   On: 02/11/2015 04:01    Review of Systems  Constitutional: Negative for fever.  Respiratory: Negative for shortness of breath.   Cardiovascular: Negative for chest pain.  Gastrointestinal: Negative for nausea, vomiting and abdominal pain.   Blood pressure 139/94, pulse 66, temperature 97.6 F (36.4 C), temperature source Oral, resp. rate 17, height _0  (1.88 m), weight 76.4 kg (168 lb 6.9 oz), SpO2 100 %. Physical Exam  Vitals reviewed. Constitutional: He appears well-developed and well-nourished.  Eyes: No scleral icterus.  Cardiovascular: Normal rate, regular rhythm and normal heart sounds.   Respiratory: Effort normal and breath sounds normal. He has no wheezes. He has no rales.  GI: Soft. Bowel sounds are normal. There is no tenderness (no abdominal tenderness pd catheter right side). A hernia is present. Hernia confirmed positive in the right inguinal area (tender rih, cannot reduce, he wont let me examine him).    Assessment/Plan: rih  I think he has incarcerated rih.  We agreed to ct scan. He is hesitant about surgery although I told him I think this is what he needs. Will do this without contrast asap.  I have made him npo. I think needs to be reversed and  will discuss this with primary team as he likely will need surgery today.    Traci Gafford 02/12/2015, 9:40 AM

## 2015-02-12 NOTE — Progress Notes (Signed)
Admit: 02/10/2015 LOS: 1  21M ESRD Nonadherent here with Abd pain, hyperkalemia  Subjective:  New c/o R 'scrotal' / inguinal pain -- has inguinal mass, is tender HD yesterday,  4h, 3.8L UF, post weight  76.4kg K 4.8 this AM  07/23 0701 - 07/24 0700 In: 1981.1 [P.O.:1440; I.V.:541.1] Out: 4220 [Urine:450]  Filed Weights   02/11/15 0233 02/11/15 0720 02/11/15 1145  Weight: 79.1 kg (174 lb 6.1 oz) 81.1 kg (178 lb 12.7 oz) 76.4 kg (168 lb 6.9 oz)    Scheduled Meds: . atorvastatin  40 mg Oral q1800  . carvedilol  25 mg Oral BID WC  . cinacalcet  30 mg Oral Q breakfast  . docusate sodium  100 mg Oral BID  . hydrALAZINE  50 mg Oral 3 times per day  . insulin aspart  0-5 Units Subcutaneous QHS  . insulin aspart  0-9 Units Subcutaneous TID WC  . multivitamin  1 tablet Oral QHS  . pantoprazole (PROTONIX) IV  40 mg Intravenous Q24H  . polyethylene glycol  17 g Oral BID  . predniSONE  5 mg Oral Q breakfast  . sevelamer carbonate  1,600 mg Oral TID WC  . sodium chloride  3 mL Intravenous Q12H  . sodium polystyrene  60 g Oral Once  . warfarin  7.5 mg Oral ONCE-1800  . Warfarin - Pharmacist Dosing Inpatient   Does not apply q1800   Continuous Infusions:  PRN Meds:.sodium chloride, sodium chloride, ALPRAZolam, alum & mag hydroxide-simeth, heparin, heparin, levalbuterol, lidocaine (PF), lidocaine-prilocaine, ondansetron, oxyCODONE, pentafluoroprop-tetrafluoroeth, sevelamer carbonate  Current Labs: reviewed    Physical Exam:  Blood pressure 139/94, pulse 66, temperature 97.6 F (36.4 C), temperature source Oral, resp. rate 17, height _0  (1.88 m), weight 76.4 kg (168 lb 6.9 oz), SpO2 100 %. GEN: NAD, on RA breathing comfortably ENT: NCAT EYES: EOMI CV: RRR, no rub, nl s1s2 PULM: CTAB ABD: s/nt/nd SKIN: R IJ TDC CDK EXT:no LEE  A/P 1. ESRD:  1. [facility]S at [facility] using [facility] 2. Not currently a candidate to transfer to [facility] as has been addressed numerous times 3. Keep on THS  Schedule 4. Using TDC 5. Heparin with HD 2. R Inguinal Mass: likey hernia, painful; will ask [facility] to eval 3. HTN/Vol:  1. Use 76kg as EDW 2. BP stable 4. Hyperkalemia: 1. resolved 5. Anemia:  1. 2/2 no ESA from nonadherence 2. Needs to go to dialysis; can't address appropriately on hospital based urgent HD 6. MBD: stable currently, to be addressed as outpt 7. Abd Pain: per primary 8. UE DVT on heparin gtt and warfarin; Per primary and on warfarin  Pearson Grippe MD 02/12/2015, 8:48 AM   Recent Labs Lab 02/11/15 0442 02/11/15 1833 02/12/15 0606  NA 131* 133* 134*  K 5.6* 4.9 4.8  CL 96* 97* 97*  CO2 _1 GLUCOSE 73 111* 134*  BUN 51* 27* 35*  CREATININE 11.24* 6.77* 8.09*  CALCIUM 8.0* 7.3* 7.6*  PHOS  --  4.1  --     Recent Labs Lab 02/10/15 2123 02/11/15 0442 02/12/15 0606  WBC 9.6 10.8* 7.4  HGB 9.0* 8.8* 8.0*  HCT 27.8* 27.8* 24.8*  MCV 84.5 84.2 84.9  PLT 229 214 184

## 2015-02-12 NOTE — Progress Notes (Addendum)
Late entry:   Unscheduled surgery planned for patient.  Lamar Blinks, NP called this RN regarding further plans for patient care.  RN called Dr. Georgette Dover, on-call surgery MD, regarding specifics of patient's impending surgery.  Per Dr. Georgette Dover, patient will present for surgery tomorrow at an unknown time.  Heparin drip will be resumed and stopped tomorrow (7/25) morning.  Patient will be started on clear liquids now, and will be NPO at midnight.  Verbal orders entered and implemented.  Heparin drip restarted at 22 cc/hr per pharmacy.  Explained situation and MD orders to patient, who acknowledged understanding.  Will continue to monitor.

## 2015-02-12 NOTE — Progress Notes (Addendum)
[facility] PROGRESS NOTE  Frank [name] NBZ:[id number] DOB: 10-Aug-1965 DOA: 02/10/2015 PCP: Angelica Chessman, MD  Assessment/Plan: Principal Problem:   Hyperkalemia Active Problems:   ESRD on dialysis- Tues, Thurs, Sat in [facility], nephro: [name]   Chronic combined systolic and diastolic CHF (congestive heart failure)   DM (diabetes mellitus), type 2, uncontrolled, with renal complications   Anemia of chronic disease   Dyslipidemia   HTN (hypertension)   Left upper extremity deep vein thrombosis      ESRD , hemodialysis Tuesday Thursday and Friday at [facility] - Received insulin, bicarb, calcium, glucose, ESRD for  hyperkalemia Appreciate nephrology input   Scrotal pain, suspected incarcerated and while hernia Seen with Dr. Donne Hazel, patient has a CT abdomen pelvis ordered, anticipated to go to the OR in the next 4-6 hours Reversed warfarin coagulopathy with FFP and vitamin K Repeat INR prior to surgery, patient has been made nothing by mouth FFP one hour prior to procedure , RESTART HEPARIN AFTER SURGERY BASED ON HIS BLEEDING RISK POST OP -DR WAKEFIELD TO ADVISE     Left upper extremity deep vein thrombosis, RIJ thrombus  Patient has been on heparin drip and INR is therapeutic today, unfortunately this needs to be reversed due to emergent need for surgical procedure   Abdominal pain, suspected incarcerated inguinal hernia Thought to be secondary to constipation, however abdominal pain has resolved and the patient developed scrotal pain this morning Evaluated by general surgery    Elevated Troponin Unchanged and flat in the setting of ESRD Chest pain-free,    Chronic combined systolic and diastolic CHF (congestive heart failure) - Per previous notes, this is managed with HD - HD will be ordered by Nephrology   DM (diabetes mellitus), type 2, uncontrolled, with renal complications - SSI ordered   Anemia of chronic disease CBC stable,  transfuse during hemodialysis if needed   HTN (hypertension) Continue Coreg and hydralazine    Code Status:      Code Status Orders        Start     Ordered   02/11/15 0210  Full code   Continuous     02/11/15 0209     Family Communication: family updated about patient's clinical progress Disposition Plan:  As above    Brief narrative: 49yo man with extensive PMH, most importantly recent admission for LUE DVT per last discharge summary as well as chronic right IJ clot and chronic PE. He also has ESRD and is on HD. He was recently admitted as noted below. He also complains of chronic chest pain.   Admitted 7/12 - 7/16 at [facility] - found to have LUE DVT. On coumadin. Continued to have CP, thought to be MSK in nature. Also at that time with accelerated HTN due to noncompliance with medications.   Seen at [place] 02/07/15 - 02/09/15: Swelling in arm and hypertensive urgency. Found to have chronic clot in the Right IJ. He was sent home with heparin injections b/c he insisted on leaving the hospital. He was given coumadin as well. He had HD twice while at [facility]. He had a subtherapeutic INR at the time of admission. Per notes, he insisted on discharge and was given injectable heparin and coumadin to go home with.   Today, his main symptom was a knot in his stomach. He notes that he thinks the clot in his arm or neck moved into his lungs and then into his stomach. He is  very concerned that the clots are moving all over his body. He told me that he had a syncopal episode today in his car and had to be pulled out of his car "by strangers." He did not tell the same story to the ED providers. He said the syncope was brought on by the clots moving in to his abdomen. He has other chronic pains including in his right shoulder, which also "moves all around" and chest pain which is reproducible on palpation. Further symptoms include fatigue, epigastric pain, decreased PO  intake. He reports taking his medications including injectable heparin given to him at discharge.   Objective findings in the ED showed hyperkalemia with K of 6.6, Creatinine increase to 10.75. Lipase was normal. AST was elevated to 61 (3:1 ratio AST to ALT). His INR was 1.49. H/H were stable, chronically low. Troponin I was 0.12, however, he has CKD.   Consultants:  None  Procedures:  *None  Antibiotics: Anti-infectives    None         HPI/Subjective: Complains of acute scrotal pain this morning, minimal nausea and abdominal pain  Objective: Filed Vitals:   02/11/15 1636 02/11/15 2100 02/12/15 0457 02/12/15 0736  BP: 137/81 137/99 135/95 139/94  Pulse: 77 72 66 66  Temp: 99.1 F (37.3 C) 98.9 F (37.2 C) 97.8 F (36.6 C) 97.6 F (36.4 C)  TempSrc: Oral Oral Oral Oral  Resp: _0 Height:      Weight:      SpO2: 98% 98% 100% 100%    Intake/Output Summary (Last 24 hours) at 02/12/15 0955 Last data filed at 02/12/15 0827  Gross per 24 hour  Intake 1981.11 ml  Output   4220 ml  Net -2238.89 ml    Exam:  General: No acute respiratory distress Lungs: Clear to auscultation bilaterally without wheezes or crackles Cardiovascular: Regular rate and rhythm without murmur gallop or rub normal S1 and S2 Abdomen: Nontender, nondistended, soft, bowel sounds positive, no rebound, no ascites, no appreciable mass Extremities: No significant cyanosis, clubbing, or edema bilateral lower extremities     Data Review   Micro Results No results found for this or any previous visit (from the past 240 hour(s)).  Radiology Reports Dg Chest 2 View  02/10/2015   CLINICAL DATA:  Acute onset of generalized chest pain and lethargy. Shortness of breath. Initial encounter.  EXAM: CHEST  2 VIEW  COMPARISON:  Chest radiograph and CTA of the chest performed 01/31/2015  FINDINGS: The lungs are well-aerated. Mild vascular congestion is noted. There is no evidence of focal  opacification, pleural effusion or pneumothorax.  The heart is mildly enlarged. A right-sided dual-lumen catheter is noted ending within the right atrium. No acute osseous abnormalities are seen.  IMPRESSION: Mild vascular congestion and mild cardiomegaly noted. Lungs remain grossly clear.   Electronically Signed   By: Garald Balding M.D.   On: 02/10/2015 21:51   Dg Chest 2 View  01/31/2015   CLINICAL DATA:  Shortness of breath. Left-sided chest pain. Dialysis patient. History of blood clots.  EXAM: CHEST  2 VIEW  COMPARISON:  01/26/2015  FINDINGS: Mild cardiac enlargement without significant vascular congestion. Mediastinal contours appear intact. No focal airspace disease or consolidation in the lungs. No blunting of costophrenic angles. No pneumothorax. Unchanged appearance of right central venous catheter.  IMPRESSION: Mild cardiac enlargement.  No evidence of active pulmonary disease.   Electronically Signed   By: Lucienne Capers M.D.   On: 01/31/2015  01:24   Abd 1 View (kub)  02/11/2015   CLINICAL DATA:  Acute onset of generalized abdominal pain. Initial encounter.  EXAM: ABDOMEN - 1 VIEW  COMPARISON:  CT of the abdomen and pelvis from 11/05/2014, and abdominal radiograph performed 12/02/2014  FINDINGS: The visualized bowel gas pattern is unremarkable. Scattered air and stool filled loops of colon are seen; no abnormal dilatation of small bowel loops is seen to suggest small bowel obstruction. No free intra-abdominal air is identified, though evaluation for free air is limited on a single supine view.  The visualized osseous structures are within normal limits; the sacroiliac joints are unremarkable in appearance. A dialysis catheter is noted overlying the pelvis. The visualized lung bases are essentially clear.  IMPRESSION: Unremarkable bowel gas pattern; no free intra-abdominal air seen. Moderate amount of stool noted in the colon.   Electronically Signed   By: Garald Balding M.D.   On: 02/11/2015  04:14   Ct Angio Chest Pe W/cm &/or Wo Cm  01/31/2015   CLINICAL DATA:  Patient woke up this evening with central chest pain and shortness of breath.  EXAM: CT ANGIOGRAPHY CHEST WITH CONTRAST  TECHNIQUE: Multidetector CT imaging of the chest was performed using the standard protocol during bolus administration of intravenous contrast. Multiplanar CT image reconstructions and MIPs were obtained to evaluate the vascular anatomy.  CONTRAST:  138m OMNIPAQUE IOHEXOL 350 MG/ML SOLN  COMPARISON:  10/07/2014  FINDINGS: Technically adequate study with good opacification of the central and segmental pulmonary arteries. No focal filling defects are demonstrated. No evidence of significant pulmonary embolus.  Cardiac enlargement. Mild dilatation of the proximal descending thoracic aorta at 3.8 cm diameter. No change since prior study. Enlarged lymph nodes demonstrated throughout the mediastinum. Largest lymph node is in the pretracheal region and measures about 16 mm short axis dimension. Lymph nodes appear similar to prior study and may represent reactive change. Esophagus is decompressed.  Evaluation of lungs is limited due to respiratory motion artifact. No evidence of focal consolidation or airspace disease. Airways appear patent. No pleural effusions. No pneumothorax.  Included portions of the upper abdominal organs demonstrate retrograde opacification of the hepatic veins suggesting right heart failure. No destructive bone lesions.  Review of the MIP images confirms the above findings.  IMPRESSION: Cardiac enlargement and contrast reflux into the hepatic veins suggesting right heart failure. No evidence of significant pulmonary embolus.   Electronically Signed   By: WLucienne CapersM.D.   On: 01/31/2015 05:02   UKoreaAbdomen Complete  02/11/2015   CLINICAL DATA:  Acute onset of midline abdominal pain. Initial encounter.  EXAM: ULTRASOUND ABDOMEN COMPLETE  COMPARISON:  CT of the abdomen and pelvis performed 11/05/2014   FINDINGS: Gallbladder: No gallstones or wall thickening visualized. No sonographic Murphy sign noted.  Common bile duct: Diameter: 0.4 cm, within normal limits in caliber.  Liver: No focal lesion identified. Within normal limits in parenchymal echogenicity.  IVC: No abnormality visualized.  Pancreas: Visualized portion unremarkable.  Spleen: Not characterized on this study.  Right Kidney: Length: 7.3 cm. Diffusely increased parenchymal echogenicity, with mild atrophy. A small 1.4 cm cyst is noted at the upper pole of the right kidney. No hydronephrosis visualized.  Left Kidney:  The native left kidney is not visualized.  Transplant Kidney: Length: 10.6 cm. Echogenicity within normal limits. No mass or hydronephrosis visualized.  Abdominal aorta: No aneurysm visualized.  Other findings: A small amount of ascites is noted at the left lower quadrant, with adjacent small  bowel loops.  IMPRESSION: 1. No acute abnormality seen to explain the patient's symptoms. 2. Mild atrophy of the native right kidney, with findings of medical renal disease. Native left kidney not visualized. Transplant kidney is unremarkable in appearance. 3. Small amount of ascites at the left lower quadrant.   Electronically Signed   By: Garald Balding M.D.   On: 02/11/2015 04:01   Dg Chest Port 1 View  01/26/2015   CLINICAL DATA:  Central chest pain  EXAM: PORTABLE CHEST - 1 VIEW  COMPARISON:  01/09/2015  FINDINGS: Dialysis catheter from right IJ approach is in stable position, tip at the right atrium. Stable cardiomegaly. Stable aortic and hilar contours.  Baseline appearance of the lungs. There is no edema, consolidation, effusion, or pneumothorax.  No acute osseous findings.  IMPRESSION: Stable exam.  No acute cardiopulmonary disease.   Electronically Signed   By: Monte Fantasia M.D.   On: 01/26/2015 04:30     CBC  Recent Labs Lab 02/10/15 2123 02/11/15 0442 02/12/15 0606  WBC 9.6 10.8* 7.4  HGB 9.0* 8.8* 8.0*  HCT 27.8* 27.8*  24.8*  PLT 229 214 184  MCV 84.5 84.2 84.9  MCH 27.4 26.7 27.4  MCHC 32.4 31.7 32.3  RDW 18.9* 19.0* 19.0*    Chemistries   Recent Labs Lab 02/10/15 2123 02/10/15 2136 02/11/15 0442 02/11/15 1833 02/12/15 0606  NA 130*  --  131* 133* 134*  K 6.6*  --  5.6* 4.9 4.8  CL 97*  --  96* 97* 97*  CO2 24  --  _0 GLUCOSE 86  --  73 111* 134*  BUN 46*  --  51* 27* 35*  CREATININE 10.75*  --  11.24* 6.77* 8.09*  CALCIUM 7.9*  --  8.0* 7.3* 7.6*  AST  --  61* 63*  --  52*  ALT  --  17 20  --  15*  ALKPHOS  --  98 95  --  85  BILITOT  --  0.6 0.8  --  0.5   ------------------------------------------------------------------------------------------------------------------ estimated creatinine clearance is 11.8 mL/min (by C-G formula based on Cr of 8.09). ------------------------------------------------------------------------------------------------------------------ No results for input(s): HGBA1C in the last 72 hours. ------------------------------------------------------------------------------------------------------------------ No results for input(s): CHOL, HDL, LDLCALC, TRIG, CHOLHDL, LDLDIRECT in the last 72 hours. ------------------------------------------------------------------------------------------------------------------ No results for input(s): TSH, T4TOTAL, T3FREE, THYROIDAB in the last 72 hours.  Invalid input(s): FREET3 ------------------------------------------------------------------------------------------------------------------ No results for input(s): VITAMINB12, FOLATE, FERRITIN, TIBC, IRON, RETICCTPCT in the last 72 hours.  Coagulation profile  Recent Labs Lab 02/10/15 2136 02/11/15 0442 02/12/15 0606  INR 1.49 1.61* 2.02*    No results for input(s): DDIMER in the last 72 hours.  Cardiac Enzymes  Recent Labs Lab 02/11/15 0442 02/11/15 1833  TROPONINI 0.09* 0.07*    ------------------------------------------------------------------------------------------------------------------ Invalid input(s): POCBNP   CBG:  Recent Labs Lab 02/11/15 0236 02/11/15 1216 02/11/15 1635 02/11/15 2125 02/12/15 0733  GLUCAP 78 73 136* 136* 135*       Studies: Dg Chest 2 View  02/10/2015   CLINICAL DATA:  Acute onset of generalized chest pain and lethargy. Shortness of breath. Initial encounter.  EXAM: CHEST  2 VIEW  COMPARISON:  Chest radiograph and CTA of the chest performed 01/31/2015  FINDINGS: The lungs are well-aerated. Mild vascular congestion is noted. There is no evidence of focal opacification, pleural effusion or pneumothorax.  The heart is mildly enlarged. A right-sided dual-lumen catheter is noted ending within the right atrium. No acute osseous abnormalities are  seen.  IMPRESSION: Mild vascular congestion and mild cardiomegaly noted. Lungs remain grossly clear.   Electronically Signed   By: Garald Balding M.D.   On: 02/10/2015 21:51   Abd 1 View (kub)  02/11/2015   CLINICAL DATA:  Acute onset of generalized abdominal pain. Initial encounter.  EXAM: ABDOMEN - 1 VIEW  COMPARISON:  CT of the abdomen and pelvis from 11/05/2014, and abdominal radiograph performed 12/02/2014  FINDINGS: The visualized bowel gas pattern is unremarkable. Scattered air and stool filled loops of colon are seen; no abnormal dilatation of small bowel loops is seen to suggest small bowel obstruction. No free intra-abdominal air is identified, though evaluation for free air is limited on a single supine view.  The visualized osseous structures are within normal limits; the sacroiliac joints are unremarkable in appearance. A dialysis catheter is noted overlying the pelvis. The visualized lung bases are essentially clear.  IMPRESSION: Unremarkable bowel gas pattern; no free intra-abdominal air seen. Moderate amount of stool noted in the colon.   Electronically Signed   By: Garald Balding  M.D.   On: 02/11/2015 04:14   US Abdomen Complete  02/11/2015   CLINICAL DATA:  Acute onset of midline abdominal pain. Initial encounter.  EXAM: ULTRASOUND ABDOMEN COMPLETE  COMPARISON:  CT of the abdomen and pelvis performed 11/05/2014  FINDINGS: Gallbladder: No gallstones or wall thickening visualized. No sonographic Murphy sign noted.  Common bile duct: Diameter: 0.4 cm, within normal limits in caliber.  Liver: No focal lesion identified. Within normal limits in parenchymal echogenicity.  IVC: No abnormality visualized.  Pancreas: Visualized portion unremarkable.  Spleen: Not characterized on this study.  Right Kidney: Length: 7.3 cm. Diffusely increased parenchymal echogenicity, with mild atrophy. A small 1.4 cm cyst is noted at the upper pole of the right kidney. No hydronephrosis visualized.  Left Kidney:  The native left kidney is not visualized.  Transplant Kidney: Length: 10.6 cm. Echogenicity within normal limits. No mass or hydronephrosis visualized.  Abdominal aorta: No aneurysm visualized.  Other findings: A small amount of ascites is noted at the left lower quadrant, with adjacent small bowel loops.  IMPRESSION: 1. No acute abnormality seen to explain the patient's symptoms. 2. Mild atrophy of the native right kidney, with findings of medical renal disease. Native left kidney not visualized. Transplant kidney is unremarkable in appearance. 3. Small amount of ascites at the left lower quadrant.   Electronically Signed   By: Garald Balding M.D.   On: 02/11/2015 04:01      Lab Results  Component Value Date   HGBA1C 5.9* 01/31/2015   HGBA1C 5.70 10/26/2014   HGBA1C 5.9* 10/17/2013   Lab Results  Component Value Date   CREATININE 8.09* 02/12/2015       Scheduled Meds: . sodium chloride   Intravenous Once  . atorvastatin  40 mg Oral q1800  . carvedilol  25 mg Oral BID WC  . cinacalcet  30 mg Oral Q breakfast  . docusate sodium  100 mg Oral BID  . hydrALAZINE  50 mg Oral 3 times per  day  . insulin aspart  0-5 Units Subcutaneous QHS  . insulin aspart  0-9 Units Subcutaneous TID WC  . multivitamin  1 tablet Oral QHS  . pantoprazole (PROTONIX) IV  40 mg Intravenous Q24H  . phytonadione (VITAMIN K) IV  10 mg Intravenous Once  . polyethylene glycol  17 g Oral BID  . predniSONE  5 mg Oral Q breakfast  . sevelamer carbonate  1,600 mg Oral TID WC  . sodium chloride  3 mL Intravenous Q12H  . sodium polystyrene  60 g Oral Once   Continuous Infusions:   Principal Problem:   Hyperkalemia Active Problems:   ESRD on dialysis- Tues, Thurs, Sat in [facility], nephro: [name]   Chronic combined systolic and diastolic CHF (congestive heart failure)   DM (diabetes mellitus), type 2, uncontrolled, with renal complications   Anemia of chronic disease   Dyslipidemia   HTN (hypertension)   Left upper extremity deep vein thrombosis    Time spent: 45 minutes   [facility] Pager [phone]. If 7PM-7AM, please contact night-coverage at [email or web address], password [facility] 02/12/2015, 9:55 AM  LOS: 1 day

## 2015-02-12 NOTE — Progress Notes (Signed)
PT Cancellation Note  Patient Details Name: TRAVION KE MRN: [id number] DOB: [birth year]   Cancelled Treatment:    Reason Eval/Treat Not Completed: Medical issues which prohibited therapy   Noted Surgery would like to perform surgery for R Inguinal Hernia today;  Will hole PT eval until postop;   Thanks,  Roney Marion, PT  Acute Rehabilitation Services Pager [phone] Office [phone]    Roney Marion [facility] 02/12/2015, 10:33 AM

## 2015-02-12 NOTE — Progress Notes (Signed)
ANTICOAGULATION CONSULT NOTE - Initial Consult  Pharmacy Consult for Heparin Indication: pulmonary embolus and DVT  Allergies  Allergen Reactions  . Ferrlecit [Na Ferric Gluc Cplx In Sucrose] Shortness Of Breath, Swelling and Other (See Comments)    Swelling in throat  . Darvocet [Propoxyphene N-Acetaminophen] Hives    Patient Measurements: Height: _0  (188 cm) Weight: 168 lb 6.9 oz (76.4 kg) IBW/kg (Calculated) : 82.2 Heparin Dosing Weight:   Vital Signs: Temp: 98 F (36.7 C) (07/24 2100) Temp Source: Oral (07/24 2100) BP: 122/91 mmHg (07/24 2100) Pulse Rate: 71 (07/24 2100)  Labs:  Recent Labs  02/10/15 2123  02/11/15 0442 02/11/15 1014 02/11/15 1833 02/12/15 0606 02/12/15 1742  HGB 9.0*  --  8.8*  --   --  8.0*  --   HCT 27.8*  --  27.8*  --   --  24.8*  --   PLT 229  --  214  --   --  184  --   LABPROT  --   < > 19.1*  --   --  22.8* 18.3*  INR  --   < > 1.61*  --   --  2.02* 1.52*  HEPARINUNFRC  --   --   --  0.27* 0.19*  --   --   CREATININE 10.75*  --  11.24*  --  6.77* 8.09*  --   TROPONINI  --   --  0.09*  --  0.07*  --   --   < > = values in this interval not displayed.  Estimated Creatinine Clearance: 11.8 mL/min (by C-G formula based on Cr of 8.09).   Medical History: Past Medical History  Diagnosis Date  . Hypertension   . Depression   . Complication of anesthesia     itching, sore throat  . Diabetes mellitus without complication     No history per patient, but remains under history as A1c would not be accurate given on dialysis  . Shortness of breath   . Anxiety   . ESRD (end stage renal disease)     due to HTN per patient, followed at [facility], s/p failed kidney transplant - dialysis Tue, Th, Sat  . Renal insufficiency   . CHF (congestive heart failure)   . Anemia   . Dialysis patient     Medications:  Scheduled:  . sodium chloride   Intravenous Once  . atorvastatin  40 mg Oral q1800  . carvedilol  25 mg Oral BID WC  . cinacalcet   30 mg Oral Q breakfast  . docusate sodium  100 mg Oral BID  . hydrALAZINE  50 mg Oral 3 times per day  . insulin aspart  0-5 Units Subcutaneous QHS  . insulin aspart  0-9 Units Subcutaneous TID WC  . multivitamin  1 tablet Oral QHS  . pantoprazole (PROTONIX) IV  40 mg Intravenous Q24H  . polyethylene glycol  17 g Oral BID  . predniSONE  5 mg Oral Q breakfast  . sevelamer carbonate  1,600 mg Oral TID WC  . sodium chloride  3 mL Intravenous Q12H  . sodium polystyrene  60 g Oral Once    Assessment: 49yo male with anticoagulation d/c'd today for surgery, now planned for 7/25- to resume Heparin until AM in setting of Acute DVT as well as chronic PE/R-IJ clot.  INR 1.52 this afternoon.  Goal of Therapy:  Heparin level 0.3-0.7 units/ml Monitor platelets by anticoagulation protocol: Yes   Plan:  Heparin 2200 units/hr  and turn off at 6AM Will not check level F/U in AM  Gracy Bruins, PharmD [facility]

## 2015-02-12 NOTE — Progress Notes (Signed)
[place] for Heparin and Coumadin Indication: pulmonary embolus and DVT  Allergies  Allergen Reactions  . Ferrlecit [Na Ferric Gluc Cplx In Sucrose] Shortness Of Breath, Swelling and Other (See Comments)    Swelling in throat  . Darvocet [Propoxyphene N-Acetaminophen] Hives    Patient Measurements: Height: 6' 2" (188 cm) Weight: 168 lb 6.9 oz (76.4 kg) IBW/kg (Calculated) : 82.2 Heparin Dosing Weight:   Vital Signs: Temp: 97.8 F (36.6 C) (07/24 0457) Temp Source: Oral (07/24 0457) BP: 135/95 mmHg (07/24 0457) Pulse Rate: 66 (07/24 0457)  Labs:  Recent Labs  02/10/15 2123 02/10/15 2136 02/11/15 0442 02/11/15 1014 02/11/15 1833 02/12/15 0606  HGB 9.0*  --  8.8*  --   --  8.0*  HCT 27.8*  --  27.8*  --   --  24.8*  PLT 229  --  214  --   --  184  LABPROT  --  18.1* 19.1*  --   --  22.8*  INR  --  1.49 1.61*  --   --  2.02*  HEPARINUNFRC  --   --   --  0.27* 0.19*  --   CREATININE 10.75*  --  11.24*  --  6.77* 8.09*  TROPONINI  --   --  0.09*  --  0.07*  --     Estimated Creatinine Clearance: 11.8 mL/min (by C-G formula based on Cr of 8.09).  Assessment: 49 yo male with h/o LUE DVT, chronic PE, and chronic R-IJ clot, for anticoagulation    Goal of Therapy:  INR 2-3 Monitor platelets by anticoagulation protocol: Yes   Plan:  INR > 2 this morning, so will d/c heparin Coumadin 7.5 mg today  Phillis Knack, PharmD, BCPS

## 2015-02-12 NOTE — Progress Notes (Signed)
Per OR patient not posted for surgery yet.

## 2015-02-13 LAB — COMPREHENSIVE METABOLIC PANEL
ALT: 14 U/L — ABNORMAL LOW (ref 17–63)
ANION GAP: 9 (ref 5–15)
AST: 50 U/L — ABNORMAL HIGH (ref 15–41)
Albumin: 1.9 g/dL — ABNORMAL LOW (ref 3.5–5.0)
Alkaline Phosphatase: 94 U/L (ref 38–126)
BUN: 47 mg/dL — ABNORMAL HIGH (ref 6–20)
CHLORIDE: 95 mmol/L — AB (ref 101–111)
CO2: 26 mmol/L (ref 22–32)
CREATININE: 10.08 mg/dL — AB (ref 0.61–1.24)
Calcium: 7.3 mg/dL — ABNORMAL LOW (ref 8.9–10.3)
GFR calc non Af Amer: 5 mL/min — ABNORMAL LOW (ref >60)
GFR, EST AFRICAN AMERICAN: 6 mL/min — AB (ref >60)
GLUCOSE: 104 mg/dL — AB (ref 65–99)
POTASSIUM: 4.7 mmol/L (ref 3.5–5.1)
Sodium: 130 mmol/L — ABNORMAL LOW (ref 135–145)
Total Bilirubin: 0.4 mg/dL (ref 0.3–1.2)
Total Protein: 6.2 g/dL — ABNORMAL LOW (ref 6.5–8.1)

## 2015-02-13 LAB — SURGICAL PCR SCREEN
MRSA, PCR: NEGATIVE
Staphylococcus aureus: NEGATIVE

## 2015-02-13 LAB — CBC
HEMATOCRIT: 26 % — AB (ref 39.0–52.0)
HEMOGLOBIN: 8.4 g/dL — AB (ref 13.0–17.0)
MCH: 27.3 pg (ref 26.0–34.0)
MCHC: 32.3 g/dL (ref 30.0–36.0)
MCV: 84.4 fL (ref 78.0–100.0)
Platelets: 207 10*3/uL (ref 150–400)
RBC: 3.08 MIL/uL — ABNORMAL LOW (ref 4.22–5.81)
RDW: 18.8 % — ABNORMAL HIGH (ref 11.5–15.5)
WBC: 7 10*3/uL (ref 4.0–10.5)

## 2015-02-13 LAB — GLUCOSE, CAPILLARY
Glucose-Capillary: 83 mg/dL (ref 65–99)
Glucose-Capillary: 104 mg/dL — ABNORMAL HIGH (ref 65–99)
Glucose-Capillary: 110 mg/dL — ABNORMAL HIGH (ref 65–99)
Glucose-Capillary: 111 mg/dL — ABNORMAL HIGH (ref 65–99)

## 2015-02-13 LAB — PREPARE FRESH FROZEN PLASMA
Unit division: 0
Unit division: 0

## 2015-02-13 LAB — PROTIME-INR
INR: 1.4 (ref 0.00–1.49)
PROTHROMBIN TIME: 17.3 s — AB (ref 11.6–15.2)

## 2015-02-13 LAB — HEMOGLOBIN A1C
Hgb A1c MFr Bld: 5.4 % (ref 4.8–5.6)
MEAN PLASMA GLUCOSE: 108 mg/dL

## 2015-02-13 LAB — HEPARIN LEVEL (UNFRACTIONATED): HEPARIN UNFRACTIONATED: 0.26 [IU]/mL — AB (ref 0.30–0.70)

## 2015-02-13 MED ORDER — HYDROMORPHONE HCL 1 MG/ML IJ SOLN
1.0000 mg | Freq: Four times a day (QID) | INTRAMUSCULAR | Status: DC | PRN
Start: 2015-02-13 — End: 2015-02-14
  Administered 2015-02-13 – 2015-02-14 (×3): 1 mg via INTRAVENOUS
  Filled 2015-02-13 (×2): qty 1

## 2015-02-13 MED ORDER — HEPARIN (PORCINE) IN NACL 100-0.45 UNIT/ML-% IJ SOLN
2400.0000 [IU]/h | INTRAMUSCULAR | Status: AC
  Administered 2015-02-13: 2250 [IU]/h via INTRAVENOUS
  Filled 2015-02-13 (×2): qty 250

## 2015-02-13 MED ORDER — HEPARIN BOLUS VIA INFUSION
1100.0000 [IU] | Freq: Once | INTRAVENOUS | Status: AC
  Administered 2015-02-13: 1100 [IU] via INTRAVENOUS
  Filled 2015-02-13: qty 1100

## 2015-02-13 MED ORDER — HYDRALAZINE HCL 20 MG/ML IJ SOLN
5.0000 mg | Freq: Once | INTRAMUSCULAR | Status: AC
  Administered 2015-02-13: 5 mg via INTRAVENOUS
  Filled 2015-02-13: qty 1

## 2015-02-13 NOTE — Progress Notes (Signed)
[place] for Heparin Indication: pulmonary embolus and DVT  Allergies  Allergen Reactions  . Ferrlecit [Na Ferric Gluc Cplx In Sucrose] Shortness Of Breath, Swelling and Other (See Comments)    Swelling in throat  . Darvocet [Propoxyphene N-Acetaminophen] Hives    Patient Measurements: Height: _0  (188 cm) Weight: 167 lb 5.3 oz (75.9 kg) IBW/kg (Calculated) : 82.2 Heparin Dosing Weight: 75.9kg  Vital Signs: Temp: 98 F (36.7 C) (07/25 0737) Temp Source: Oral (07/25 0737) BP: 144/95 mmHg (07/25 0737) Pulse Rate: 72 (07/25 0737)  Labs:  Recent Labs  02/11/15 0442 02/11/15 1014 02/11/15 1833 02/12/15 0606 02/12/15 1742 02/13/15 0539  HGB 8.8*  --   --  8.0*  --  8.4*  HCT 27.8*  --   --  24.8*  --  26.0*  PLT 214  --   --  184  --  207  LABPROT 19.1*  --   --  22.8* 18.3* 17.3*  INR 1.61*  --   --  2.02* 1.52* 1.40  HEPARINUNFRC  --  0.27* 0.19*  --   --   --   CREATININE 11.24*  --  6.77* 8.09*  --  10.08*  TROPONINI 0.09*  --  0.07*  --   --   --     Estimated Creatinine Clearance: 9.4 mL/min (by C-G formula based on Cr of 10.08).   Medical History: Past Medical History  Diagnosis Date  . Hypertension   . Depression   . Complication of anesthesia     itching, sore throat  . Diabetes mellitus without complication     No history per patient, but remains under history as A1c would not be accurate given on dialysis  . Shortness of breath   . Anxiety   . ESRD (end stage renal disease)     due to HTN per patient, followed at [facility], s/p failed kidney transplant - dialysis Tue, Th, Sat  . Renal insufficiency   . CHF (congestive heart failure)   . Anemia   . Dialysis patient     Medications:  Scheduled:  . sodium chloride   Intravenous Once  . atorvastatin  40 mg Oral q1800  . carvedilol  25 mg Oral BID WC  . cinacalcet  30 mg Oral Q breakfast  . docusate sodium  100 mg Oral BID  . hydrALAZINE  50 mg Oral 3  times per day  . insulin aspart  0-5 Units Subcutaneous QHS  . insulin aspart  0-9 Units Subcutaneous TID WC  . multivitamin  1 tablet Oral QHS  . pantoprazole (PROTONIX) IV  40 mg Intravenous Q24H  . polyethylene glycol  17 g Oral BID  . predniSONE  5 mg Oral Q breakfast  . sevelamer carbonate  1,600 mg Oral TID WC  . sodium chloride  3 mL Intravenous Q12H  . sodium polystyrene  60 g Oral Once    Assessment: 49yo male continuing on Heparin until AM in setting of Acute DVT as well as chronic PE/R-IJ clot (warfarin pta on hold). Hg 8.4 stable, pltc wnl stable. Previously scheduled for OR today and Coum d/c'd & Vit K 16m IV given 7/24. Surgery cannot get to him today. To restart heparin until AM on 7/26 and continue to hold warfarin. INR 1.4 today. Hg stable 8.4, plt stable wnl. No bleed documented.   Goal of Therapy:  Heparin level 0.3-0.7 units/ml Monitor platelets by anticoagulation protocol: Yes   Plan:  Heparin _1   units/h - turned back on this AM 8HL - will check in case OR pushed back again Daily HL/CBC/INR For OR tomorrow - f/u turn on off heparin _0  Holding warfarin  Elicia Lamp, PharmD Clinical Pharmacist Pager [phone] 02/13/2015 10:18 AM

## 2015-02-13 NOTE — Progress Notes (Signed)
[place] for Heparin Indication: pulmonary embolus and DVT  Allergies  Allergen Reactions  . Ferrlecit [Na Ferric Gluc Cplx In Sucrose] Shortness Of Breath, Swelling and Other (See Comments)    Swelling in throat  . Darvocet [Propoxyphene N-Acetaminophen] Hives    Patient Measurements: Height: _0  (188 cm) Weight: 167 lb 5.3 oz (75.9 kg) IBW/kg (Calculated) : 82.2 Heparin Dosing Weight: 75.9kg  Vital Signs: Temp: 98 F (36.7 C) (07/25 1636) Temp Source: Oral (07/25 1636) BP: 146/94 mmHg (07/25 1636) Pulse Rate: 75 (07/25 1636)  Labs:  Recent Labs  02/11/15 0442 02/11/15 1014 02/11/15 1833 02/12/15 0606 02/12/15 1742 02/13/15 0539 02/13/15 1904  HGB 8.8*  --   --  8.0*  --  8.4*  --   HCT 27.8*  --   --  24.8*  --  26.0*  --   PLT 214  --   --  184  --  207  --   LABPROT 19.1*  --   --  22.8* 18.3* 17.3*  --   INR 1.61*  --   --  2.02* 1.52* 1.40  --   HEPARINUNFRC  --  0.27* 0.19*  --   --   --  0.26*  CREATININE 11.24*  --  6.77* 8.09*  --  10.08*  --   TROPONINI 0.09*  --  0.07*  --   --   --   --     Estimated Creatinine Clearance: 9.4 mL/min (by C-G formula based on Cr of 10.08).   Medical History: Past Medical History  Diagnosis Date  . Hypertension   . Depression   . Complication of anesthesia     itching, sore throat  . Diabetes mellitus without complication     No history per patient, but remains under history as A1c would not be accurate given on dialysis  . Shortness of breath   . Anxiety   . ESRD (end stage renal disease)     due to HTN per patient, followed at [facility], s/p failed kidney transplant - dialysis Tue, Th, Sat  . Renal insufficiency   . CHF (congestive heart failure)   . Anemia   . Dialysis patient     Medications:  Scheduled:  . sodium chloride   Intravenous Once  . atorvastatin  40 mg Oral q1800  . carvedilol  25 mg Oral BID WC  . cinacalcet  30 mg Oral Q breakfast  . docusate  sodium  100 mg Oral BID  . hydrALAZINE  50 mg Oral 3 times per day  . insulin aspart  0-5 Units Subcutaneous QHS  . insulin aspart  0-9 Units Subcutaneous TID WC  . multivitamin  1 tablet Oral QHS  . pantoprazole (PROTONIX) IV  40 mg Intravenous Q24H  . polyethylene glycol  17 g Oral BID  . predniSONE  5 mg Oral Q breakfast  . sevelamer carbonate  1,600 mg Oral TID WC  . sodium chloride  3 mL Intravenous Q12H  . sodium polystyrene  60 g Oral Once    Assessment: 49yo male continuing on Heparin until AM in setting of Acute DVT as well as chronic PE/R-IJ clot (warfarin pta on hold). Hg 8.4 stable, pltc wnl stable. Previously scheduled for OR today and Coum d/c'd & Vit K 98m IV given 7/24. Surgery cannot get to him today. To restart heparin until AM on 7/26 and continue to hold warfarin. INR 1.4 today. Hg stable 8.4, plt stable wnl. No  bleed documented. Most recent HL 0.26, which is SUBtherapeutic.    Goal of Therapy:  Heparin level 0.3-0.7 units/ml Monitor platelets by anticoagulation protocol: Yes   Plan:  - Bolus heparin 1100 units x 1 - Increase heparin drip to 2400 units/hr - Will not check HL as drip to be stopped at 0600 (02/14/15) - Follow up plans to restart heparin - Daily HL/CBC/INR - Continue Holding warfarin  Nicoletta Ba, PharmD, BCPS Clinical Pharmacist [phone]

## 2015-02-13 NOTE — Progress Notes (Signed)
[name] Progress Note   Subjective: patient excited about draining his PD cath daily which he has been doing in the hospital room, without sterile gloves or procedure, on a daily basis when by "belly is swollen".  Says he is seriously considering getting a fistula placed but wants to do it after his [place] trip which is scheduled for mid-August.   Filed Vitals:   02/12/15 1624 02/12/15 2100 02/13/15 0500 02/13/15 0737  BP: 141/85 122/91 149/103 144/95  Pulse: 70 71 73 72  Temp: 97.4 F (36.3 C) 98 F (36.7 C) 98 F (36.7 C) 98 F (36.7 C)  TempSrc: Oral Oral Oral Oral  Resp: _0 Height:      Weight:  75.9 kg (167 lb 5.3 oz)    SpO2: 99% 100% 98% 99%   Exam: NAD, calm PERRL EOMI NO jvd Chest clear bilat RRR no MRG Abd soft ntnd + bs Ext no LEE R IJ TDC intact Neuro is fully a&o x 3   TTS HD at [facility]   76 kg dry wt  A/P 1. ESRD:  1. TTS at [facility] using [facility] 2. Not currently a candidate to transfer to [facility] as has been addressed numerous times 3. Keep on TTS Schedule 4. Using TDC 5. Heparin with HD 2. R Inguinal Mass: likey hernia, painful; CCS evaluated, surg postponed today d/t scheduling 3. HTN/Vol:  1. Use 76kg as EDW 2. BP stable on coreg/ hydral 4. Hyperkalemia: 1. resolved 5. Anemia:  1. 2/2 no ESA from nonadherence 2. Needs to go to dialysis; can't address appropriately on hospital based urgent HD 6. MBD: stable currently, to be addressed as outpt 7. Abd Pain: per primary 8. UE DVT on heparin gtt and warfarin; Per primary and on warfarin  Plan - HD tomorrow, surg tomorrow    Kelly Splinter MD  pager [phone]    cell [phone]  02/13/2015, 12:48 PM     Recent Labs Lab 02/11/15 1833 02/12/15 0606 02/13/15 0539  NA 133* 134* 130*  K 4.9 4.8 4.7  CL 97* 97* 95*  CO2 _1 GLUCOSE 111* 134* 104*  BUN 27* 35* 47*  CREATININE 6.77* 8.09* 10.08*  CALCIUM 7.3* 7.6* 7.3*  PHOS 4.1  --   --      Recent Labs Lab 02/11/15 0442 02/11/15 1833 02/12/15 0606 02/13/15 0539  AST 63*  --  52* 50*  ALT 20  --  15* 14*  ALKPHOS 95  --  85 94  BILITOT 0.8  --  0.5 0.4  PROT 6.1*  --  5.9* 6.2*  ALBUMIN 2.1* 1.9* 1.8* 1.9*    Recent Labs Lab 02/11/15 0442 02/12/15 0606 02/13/15 0539  WBC 10.8* 7.4 7.0  HGB 8.8* 8.0* 8.4*  HCT 27.8* 24.8* 26.0*  MCV 84.2 84.9 84.4  PLT 214 184 207   . sodium chloride   Intravenous Once  . atorvastatin  40 mg Oral q1800  . carvedilol  25 mg Oral BID WC  . cinacalcet  30 mg Oral Q breakfast  . docusate sodium  100 mg Oral BID  . hydrALAZINE  50 mg Oral 3 times per day  . insulin aspart  0-5 Units Subcutaneous QHS  . insulin aspart  0-9 Units Subcutaneous TID WC  . multivitamin  1 tablet Oral QHS  . pantoprazole (PROTONIX) IV  40 mg Intravenous Q24H  . polyethylene glycol  17 g Oral BID  . predniSONE  5 mg Oral Q breakfast  . sevelamer carbonate  1,600 mg Oral TID WC  . sodium chloride  3 mL Intravenous Q12H  . sodium polystyrene  60 g Oral Once   . heparin 2,250 Units/hr (02/13/15 0945)   sodium chloride, sodium chloride, ALPRAZolam, heparin, heparin, HYDROmorphone (DILAUDID) injection, levalbuterol, lidocaine (PF), lidocaine-prilocaine, ondansetron, oxyCODONE, pentafluoroprop-tetrafluoroeth, sevelamer carbonate

## 2015-02-13 NOTE — Progress Notes (Signed)
PT Cancellation Note  Patient Details Name: [name] MRN: [id number] DOB: 11/20/1965   Cancelled Treatment:    Reason Eval/Treat Not Completed: Other (comment)   Politely declining PT in anticipation of surgery tomorrow;    Roney Marion [facility] 02/13/2015, 4:23 PM

## 2015-02-13 NOTE — Progress Notes (Signed)
[facility] PROGRESS NOTE  Frank [name] UPJ:[id number] DOB: [birth year] DOA: 02/10/2015 PCP: Angelica Chessman, MD  Assessment/Plan: Principal Problem:   Hyperkalemia Active Problems:   ESRD on dialysis- Tues, Thurs, Sat in [facility], nephro: [name]   Chronic combined systolic and diastolic CHF (congestive heart failure)   DM (diabetes mellitus), type 2, uncontrolled, with renal complications   Anemia of chronic disease   Dyslipidemia   HTN (hypertension)   Left upper extremity deep vein thrombosis       ESRD , hemodialysis Tuesday Thursday and Friday at [facility] - Received insulin, bicarb, calcium, glucose, ESRD for  hyperkalemia Appreciate nephrology input   Scrotal pain, suspected incarcerated and while hernia Seen with [name], MD, patient has a CT abdomen pelvis ordered, anticipated to go to the [facility] ,NPO after midnight. May eat now.  Reversed warfarin coagulopathy with FFP and vitamin K Repeat INR 1.4, cont heparin gtt until midnight  RESTART HEPARIN AFTER SURGERY BASED ON HIS BLEEDING RISK POST OP -DR WAKEFIELD TO ADVISE  Started on iv dilaudid for pain   Left upper extremity deep vein thrombosis, RIJ thrombus Cont  heparin drip as INR   subtherapeutic today,   Abdominal pain, suspected incarcerated inguinal hernia Thought to be secondary to constipation, however abdominal pain has resolved and the patient developed scrotal pain due to above  Evaluated by general surgery as above    Elevated Troponin Unchanged and flat in the setting of ESRD Chest pain-free,    Chronic combined systolic and diastolic CHF (congestive heart failure) - Per previous notes, this is managed with HD - HD will be ordered by Nephrology   DM (diabetes mellitus), type 2, uncontrolled, with renal complications - SSI ordered   Anemia of chronic disease CBC stable, transfuse during hemodialysis if needed   HTN (hypertension) Continue Coreg and  hydralazine    Code Status:      Code Status Orders        Start     Ordered   02/11/15 0210  Full code   Continuous     02/11/15 0209     Family Communication: family updated about patient's clinical progress Disposition Plan:  As above    Brief narrative: 49yo man with extensive PMH, most importantly recent admission for LUE DVT per last discharge summary as well as chronic right IJ clot and chronic PE. He also has ESRD and is on HD. He was recently admitted as noted below. He also complains of chronic chest pain.   Admitted 7/12 - 7/16 at [facility] - found to have LUE DVT. On coumadin. Continued to have CP, thought to be MSK in nature. Also at that time with accelerated HTN due to noncompliance with medications.   Seen at [facility] 02/07/15 - 02/09/15: Swelling in arm and hypertensive urgency. Found to have chronic clot in the Right IJ. He was sent home with heparin injections b/c he insisted on leaving the hospital. He was given coumadin as well. He had HD twice while at [facility]. He had a subtherapeutic INR at the time of admission. Per notes, he insisted on discharge and was given injectable heparin and coumadin to go home with.   Today, his main symptom was a knot in his stomach. He notes that he thinks the clot in his arm or neck moved into his lungs and then into his stomach. He is very concerned that the clots are moving all over his  body. He told me that he had a syncopal episode today in his car and had to be pulled out of his car "by strangers." He did not tell the same story to the ED providers. He said the syncope was brought on by the clots moving in to his abdomen. He has other chronic pains including in his right shoulder, which also "moves all around" and chest pain which is reproducible on palpation. Further symptoms include fatigue, epigastric pain, decreased PO intake. He reports taking his medications including injectable heparin given to him at  discharge.   Objective findings in the ED showed hyperkalemia with K of 6.6, Creatinine increase to 10.75. Lipase was normal. AST was elevated to 61 (3:1 ratio AST to ALT). His INR was 1.49. H/H were stable, chronically low. Troponin I was 0.12, however, he has CKD.   Consultants:  None  Procedures:  *None  Antibiotics: Anti-infectives    None         HPI/Subjective: Complains of some  scrotal pain this morning,requesting pain medicine  Objective: Filed Vitals:   02/12/15 1624 02/12/15 2100 02/13/15 0500 02/13/15 0737  BP: 141/85 122/91 149/103 144/95  Pulse: 70 71 73 72  Temp: 97.4 F (36.3 C) 98 F (36.7 C) 98 F (36.7 C) 98 F (36.7 C)  TempSrc: Oral Oral Oral Oral  Resp: _0 Height:      Weight:  75.9 kg (167 lb 5.3 oz)    SpO2: 99% 100% 98% 99%    Intake/Output Summary (Last 24 hours) at 02/13/15 1249 Last data filed at 02/13/15 0850  Gross per 24 hour  Intake 831.38 ml  Output      0 ml  Net 831.38 ml    Exam:  General: No acute respiratory distress Lungs: Clear to auscultation bilaterally without wheezes or crackles Cardiovascular: Regular rate and rhythm without murmur gallop or rub normal S1 and S2 Abdomen: Nontender, nondistended, soft, bowel sounds positive, no rebound, no ascites, no appreciable mass Extremities: No significant cyanosis, clubbing, or edema bilateral lower extremities     Data Review   Micro Results Recent Results (from the past 240 hour(s))  Surgical pcr screen     Status: None   Collection Time: 02/12/15 11:03 PM  Result Value Ref Range Status   MRSA, PCR NEGATIVE NEGATIVE Final   Staphylococcus aureus NEGATIVE NEGATIVE Final    Comment:        The Xpert SA Assay (FDA approved for NASAL specimens in patients over 74 years of age), is one component of a comprehensive surveillance program.  Test performance has been validated by [facility] for patients greater than or equal to 62 year old. It is  not intended to diagnose infection nor to guide or monitor treatment.     Radiology Reports Ct Abdomen Pelvis Wo Contrast  02/12/2015   CLINICAL DATA:  Acute onset right groin pain 1 day ago. Patient reports a right groin lesion. Initial encounter.  EXAM: CT ABDOMEN AND PELVIS WITHOUT CONTRAST  TECHNIQUE: Multidetector CT imaging of the abdomen and pelvis was performed following the standard protocol without IV contrast.  COMPARISON:  CT abdomen and pelvis 11/05/2014.  FINDINGS: The patient has small bilateral pleural effusions which are new since the prior exam. Mild dependent atelectasis is identified. There is cardiomegaly and small pericardial effusion, unchanged.  There is a small amount of abdominal and pelvic free-fluid. Catheter for peritoneal dialysis is identified. The patient has a right inguinal hernia containing free  fluid. Its appearance is unchanged.  The kidneys are markedly atrophic consistent with end-stage renal disease. Renal transplant in the left lower quadrant is unchanged in appearance. There is extensive aortoiliac atherosclerosis without aneurysm. The spleen, pancreas, liver, gallbladder and adrenal glands are unremarkable. No lymphadenopathy is seen. No bony abnormality is identified.  IMPRESSION: No acute finding.  Right inguinal hernia containing free fluid is unchanged. Note is made that the patient is on peritoneal dialysis accounting for small volume of fluid in the abdomen and pelvis.  Small bilateral pleural effusions, new since the prior study.  No change in cardiomegaly and a small pericardial effusion.   Electronically Signed   By: Inge Rise M.D.   On: 02/12/2015 10:34   Dg Chest 2 View  02/10/2015   CLINICAL DATA:  Acute onset of generalized chest pain and lethargy. Shortness of breath. Initial encounter.  EXAM: CHEST  2 VIEW  COMPARISON:  Chest radiograph and CTA of the chest performed 01/31/2015  FINDINGS: The lungs are well-aerated. Mild vascular congestion  is noted. There is no evidence of focal opacification, pleural effusion or pneumothorax.  The heart is mildly enlarged. A right-sided dual-lumen catheter is noted ending within the right atrium. No acute osseous abnormalities are seen.  IMPRESSION: Mild vascular congestion and mild cardiomegaly noted. Lungs remain grossly clear.   Electronically Signed   By: Garald Balding M.D.   On: 02/10/2015 21:51   Dg Chest 2 View  01/31/2015   CLINICAL DATA:  Shortness of breath. Left-sided chest pain. Dialysis patient. History of blood clots.  EXAM: CHEST  2 VIEW  COMPARISON:  01/26/2015  FINDINGS: Mild cardiac enlargement without significant vascular congestion. Mediastinal contours appear intact. No focal airspace disease or consolidation in the lungs. No blunting of costophrenic angles. No pneumothorax. Unchanged appearance of right central venous catheter.  IMPRESSION: Mild cardiac enlargement.  No evidence of active pulmonary disease.   Electronically Signed   By: Lucienne Capers M.D.   On: 01/31/2015 01:24   Abd 1 View (kub)  02/11/2015   CLINICAL DATA:  Acute onset of generalized abdominal pain. Initial encounter.  EXAM: ABDOMEN - 1 VIEW  COMPARISON:  CT of the abdomen and pelvis from 11/05/2014, and abdominal radiograph performed 12/02/2014  FINDINGS: The visualized bowel gas pattern is unremarkable. Scattered air and stool filled loops of colon are seen; no abnormal dilatation of small bowel loops is seen to suggest small bowel obstruction. No free intra-abdominal air is identified, though evaluation for free air is limited on a single supine view.  The visualized osseous structures are within normal limits; the sacroiliac joints are unremarkable in appearance. A dialysis catheter is noted overlying the pelvis. The visualized lung bases are essentially clear.  IMPRESSION: Unremarkable bowel gas pattern; no free intra-abdominal air seen. Moderate amount of stool noted in the colon.   Electronically Signed   By:  Garald Balding M.D.   On: 02/11/2015 04:14   Ct Angio Chest Pe W/cm &/or Wo Cm  01/31/2015   CLINICAL DATA:  Patient woke up this evening with central chest pain and shortness of breath.  EXAM: CT ANGIOGRAPHY CHEST WITH CONTRAST  TECHNIQUE: Multidetector CT imaging of the chest was performed using the standard protocol during bolus administration of intravenous contrast. Multiplanar CT image reconstructions and MIPs were obtained to evaluate the vascular anatomy.  CONTRAST:  158m OMNIPAQUE IOHEXOL 350 MG/ML SOLN  COMPARISON:  10/07/2014  FINDINGS: Technically adequate study with good opacification of the central and segmental pulmonary  arteries. No focal filling defects are demonstrated. No evidence of significant pulmonary embolus.  Cardiac enlargement. Mild dilatation of the proximal descending thoracic aorta at 3.8 cm diameter. No change since prior study. Enlarged lymph nodes demonstrated throughout the mediastinum. Largest lymph node is in the pretracheal region and measures about 16 mm short axis dimension. Lymph nodes appear similar to prior study and may represent reactive change. Esophagus is decompressed.  Evaluation of lungs is limited due to respiratory motion artifact. No evidence of focal consolidation or airspace disease. Airways appear patent. No pleural effusions. No pneumothorax.  Included portions of the upper abdominal organs demonstrate retrograde opacification of the hepatic veins suggesting right heart failure. No destructive bone lesions.  Review of the MIP images confirms the above findings.  IMPRESSION: Cardiac enlargement and contrast reflux into the hepatic veins suggesting right heart failure. No evidence of significant pulmonary embolus.   Electronically Signed   By: Lucienne Capers M.D.   On: 01/31/2015 05:02   US Abdomen Complete  02/11/2015   CLINICAL DATA:  Acute onset of midline abdominal pain. Initial encounter.  EXAM: ULTRASOUND ABDOMEN COMPLETE  COMPARISON:  CT of the  abdomen and pelvis performed 11/05/2014  FINDINGS: Gallbladder: No gallstones or wall thickening visualized. No sonographic Murphy sign noted.  Common bile duct: Diameter: 0.4 cm, within normal limits in caliber.  Liver: No focal lesion identified. Within normal limits in parenchymal echogenicity.  IVC: No abnormality visualized.  Pancreas: Visualized portion unremarkable.  Spleen: Not characterized on this study.  Right Kidney: Length: 7.3 cm. Diffusely increased parenchymal echogenicity, with mild atrophy. A small 1.4 cm cyst is noted at the upper pole of the right kidney. No hydronephrosis visualized.  Left Kidney:  The native left kidney is not visualized.  Transplant Kidney: Length: 10.6 cm. Echogenicity within normal limits. No mass or hydronephrosis visualized.  Abdominal aorta: No aneurysm visualized.  Other findings: A small amount of ascites is noted at the left lower quadrant, with adjacent small bowel loops.  IMPRESSION: 1. No acute abnormality seen to explain the patient's symptoms. 2. Mild atrophy of the native right kidney, with findings of medical renal disease. Native left kidney not visualized. Transplant kidney is unremarkable in appearance. 3. Small amount of ascites at the left lower quadrant.   Electronically Signed   By: Garald Balding M.D.   On: 02/11/2015 04:01   Dg Chest Port 1 View  01/26/2015   CLINICAL DATA:  Central chest pain  EXAM: PORTABLE CHEST - 1 VIEW  COMPARISON:  01/09/2015  FINDINGS: Dialysis catheter from right IJ approach is in stable position, tip at the right atrium. Stable cardiomegaly. Stable aortic and hilar contours.  Baseline appearance of the lungs. There is no edema, consolidation, effusion, or pneumothorax.  No acute osseous findings.  IMPRESSION: Stable exam.  No acute cardiopulmonary disease.   Electronically Signed   By: Monte Fantasia M.D.   On: 01/26/2015 04:30     CBC  Recent Labs Lab 02/10/15 2123 02/11/15 0442 02/12/15 0606 02/13/15 0539  WBC  9.6 10.8* 7.4 7.0  HGB 9.0* 8.8* 8.0* 8.4*  HCT 27.8* 27.8* 24.8* 26.0*  PLT 229 214 184 207  MCV 84.5 84.2 84.9 84.4  MCH 27.4 26.7 27.4 27.3  MCHC 32.4 31.7 32.3 32.3  RDW 18.9* 19.0* 19.0* 18.8*    Chemistries   Recent Labs Lab 02/10/15 2123 02/10/15 2136 02/11/15 0442 02/11/15 1833 02/12/15 0606 02/13/15 0539  NA 130*  --  131* 133* 134* 130*  K 6.6*  --  5.6* 4.9 4.8 4.7  CL 97*  --  96* 97* 97* 95*  CO2 24  --  _0 GLUCOSE 86  --  73 111* 134* 104*  BUN 46*  --  51* 27* 35* 47*  CREATININE 10.75*  --  11.24* 6.77* 8.09* 10.08*  CALCIUM 7.9*  --  8.0* 7.3* 7.6* 7.3*  AST  --  61* 63*  --  52* 50*  ALT  --  17 20  --  15* 14*  ALKPHOS  --  98 95  --  85 94  BILITOT  --  0.6 0.8  --  0.5 0.4   ------------------------------------------------------------------------------------------------------------------ estimated creatinine clearance is 9.4 mL/min (by C-G formula based on Cr of 10.08). ------------------------------------------------------------------------------------------------------------------  Recent Labs  02/11/15 0442  HGBA1C 5.4   ------------------------------------------------------------------------------------------------------------------ No results for input(s): CHOL, HDL, LDLCALC, TRIG, CHOLHDL, LDLDIRECT in the last 72 hours. ------------------------------------------------------------------------------------------------------------------ No results for input(s): TSH, T4TOTAL, T3FREE, THYROIDAB in the last 72 hours.  Invalid input(s): FREET3 ------------------------------------------------------------------------------------------------------------------ No results for input(s): VITAMINB12, FOLATE, FERRITIN, TIBC, IRON, RETICCTPCT in the last 72 hours.  Coagulation profile  Recent Labs Lab 02/10/15 2136 02/11/15 0442 02/12/15 0606 02/12/15 1742 02/13/15 0539  INR 1.49 1.61* 2.02* 1.52* 1.40    No results for input(s):  DDIMER in the last 72 hours.  Cardiac Enzymes  Recent Labs Lab 02/11/15 0442 02/11/15 1833  TROPONINI 0.09* 0.07*   ------------------------------------------------------------------------------------------------------------------ Invalid input(s): POCBNP   CBG:  Recent Labs Lab 02/12/15 1125 02/12/15 1621 02/12/15 2051 02/13/15 0734 02/13/15 1117  GLUCAP 79 111* 120* 83 104*       Studies: Ct Abdomen Pelvis Wo Contrast  02/12/2015   CLINICAL DATA:  Acute onset right groin pain 1 day ago. Patient reports a right groin lesion. Initial encounter.  EXAM: CT ABDOMEN AND PELVIS WITHOUT CONTRAST  TECHNIQUE: Multidetector CT imaging of the abdomen and pelvis was performed following the standard protocol without IV contrast.  COMPARISON:  CT abdomen and pelvis 11/05/2014.  FINDINGS: The patient has small bilateral pleural effusions which are new since the prior exam. Mild dependent atelectasis is identified. There is cardiomegaly and small pericardial effusion, unchanged.  There is a small amount of abdominal and pelvic free-fluid. Catheter for peritoneal dialysis is identified. The patient has a right inguinal hernia containing free fluid. Its appearance is unchanged.  The kidneys are markedly atrophic consistent with end-stage renal disease. Renal transplant in the left lower quadrant is unchanged in appearance. There is extensive aortoiliac atherosclerosis without aneurysm. The spleen, pancreas, liver, gallbladder and adrenal glands are unremarkable. No lymphadenopathy is seen. No bony abnormality is identified.  IMPRESSION: No acute finding.  Right inguinal hernia containing free fluid is unchanged. Note is made that the patient is on peritoneal dialysis accounting for small volume of fluid in the abdomen and pelvis.  Small bilateral pleural effusions, new since the prior study.  No change in cardiomegaly and a small pericardial effusion.   Electronically Signed   By: Inge Rise M.D.    On: 02/12/2015 10:34      Lab Results  Component Value Date   HGBA1C 5.4 02/11/2015   HGBA1C 5.9* 01/31/2015   HGBA1C 5.70 10/26/2014   Lab Results  Component Value Date   CREATININE 10.08* 02/13/2015       Scheduled Meds: . sodium chloride   Intravenous Once  . atorvastatin  40 mg Oral q1800  . carvedilol  25 mg Oral BID WC  .  cinacalcet  30 mg Oral Q breakfast  . docusate sodium  100 mg Oral BID  . hydrALAZINE  50 mg Oral 3 times per day  . insulin aspart  0-5 Units Subcutaneous QHS  . insulin aspart  0-9 Units Subcutaneous TID WC  . multivitamin  1 tablet Oral QHS  . pantoprazole (PROTONIX) IV  40 mg Intravenous Q24H  . polyethylene glycol  17 g Oral BID  . predniSONE  5 mg Oral Q breakfast  . sevelamer carbonate  1,600 mg Oral TID WC  . sodium chloride  3 mL Intravenous Q12H  . sodium polystyrene  60 g Oral Once   Continuous Infusions: . heparin 2,250 Units/hr (02/13/15 0945)    Principal Problem:   Hyperkalemia Active Problems:   ESRD on dialysis- Tues, Thurs, Sat in [facility], nephro: [name]   Chronic combined systolic and diastolic CHF (congestive heart failure)   DM (diabetes mellitus), type 2, uncontrolled, with renal complications   Anemia of chronic disease   Dyslipidemia   HTN (hypertension)   Left upper extremity deep vein thrombosis    Time spent: 45 minutes   [facility] Pager [phone]. If 7PM-7AM, please contact night-coverage at [email or web address], password [facility] 02/13/2015, 12:49 PM  LOS: 2 days

## 2015-02-13 NOTE — Progress Notes (Signed)
.   [facility]/Frank [name] Progress Note    Subjective: Patient has incarcerated RIH, but no bowel.  Cannot get to him today.  Will  Allow to eat and put on schedule tomorrow.  Objective: Vital signs in last 24 hours: Temp:  [97.4 F (36.3 C)-98 F (36.7 C)] 98 F (36.7 C) (07/25 0737) Pulse Rate:  [70-73] 72 (07/25 0737) Resp:  [17-18] 17 (07/25 0737) BP: (122-149)/(85-103) 144/95 mmHg (07/25 0737) SpO2:  [98 %-100 %] 99 % (07/25 0737) Weight:  [75.9 kg (167 lb 5.3 oz)] 75.9 kg (167 lb 5.3 oz) (07/24 2100) Last BM Date: 02/12/15  Intake/Output from previous day: 07/24 0701 - 07/25 0700 In: 1071.4 [P.O.:960; I.V.:111.4] Out: 0  Intake/Output this shift:    General: No acute distress  Lungs: Clear  Abd: Peritoneal dialysis catheter in place.  Soft, incarcerated RIH.  Partially reduced.  Extremities: No changes  Neuro: Intact  Lab Results:  _0 (wbc:2,hgb:2,hct:2,plt:2) BMET  Recent Labs  02/12/15 0606 02/13/15 0539  NA 134* 130*  K 4.8 4.7  CL 97* 95*  CO2 29 26  GLUCOSE 134* 104*  BUN 35* 47*  CREATININE 8.09* 10.08*  CALCIUM 7.6* 7.3*   PT/INR  Recent Labs  02/12/15 1742 02/13/15 0539  LABPROT 18.3* 17.3*  INR 1.52* 1.40   ABG No results for input(s): PHART, HCO3 in the last 72 hours.  Invalid input(s): PCO2, PO2  Studies/Results: Ct Abdomen Pelvis Wo Contrast  02/12/2015   CLINICAL DATA:  Acute onset right groin pain 1 day ago. Patient reports a right groin lesion. Initial encounter.  EXAM: CT ABDOMEN AND PELVIS WITHOUT CONTRAST  TECHNIQUE: Multidetector CT imaging of the abdomen and pelvis was performed following the standard protocol without IV contrast.  COMPARISON:  CT abdomen and pelvis 11/05/2014.  FINDINGS: The patient has small bilateral pleural effusions which are new since the prior exam. Mild dependent atelectasis is identified. There is cardiomegaly and small pericardial effusion, unchanged.  There is a small amount of abdominal and pelvic  free-fluid. Catheter for peritoneal dialysis is identified. The patient has a right inguinal hernia containing free fluid. Its appearance is unchanged.  The kidneys are markedly atrophic consistent with end-stage renal disease. Renal transplant in the left lower quadrant is unchanged in appearance. There is extensive aortoiliac atherosclerosis without aneurysm. The spleen, pancreas, liver, gallbladder and adrenal glands are unremarkable. No lymphadenopathy is seen. No bony abnormality is identified.  IMPRESSION: No acute finding.  Right inguinal hernia containing free fluid is unchanged. Note is made that the patient is on peritoneal dialysis accounting for small volume of fluid in the abdomen and pelvis.  Small bilateral pleural effusions, new since the prior study.  No change in cardiomegaly and a small pericardial effusion.   Electronically Signed   By: Inge Rise M.D.   On: 02/12/2015 10:34    Anti-infectives: Anti-infectives    None      Assessment/Plan: s/p  Advance diet NPO after midnight.  May eat now. Surgery likely tomorrow.  LOS: 2 days   Kathryne Eriksson. Dahlia Bailiff, MD, FACS [phone] [facility] Surgery 02/13/2015

## 2015-02-14 ENCOUNTER — Inpatient Hospital Stay (HOSPITAL_COMMUNITY): Payer: Medicare Other | Admitting: Anesthesiology

## 2015-02-14 ENCOUNTER — Encounter (HOSPITAL_COMMUNITY)
Admission: EM | Disposition: A | Discharge status: Home / Self Care | Payer: Self-pay | Source: Home / Self Care | Attending: Internal Medicine

## 2015-02-14 ENCOUNTER — Encounter (HOSPITAL_COMMUNITY): Payer: Self-pay | Admitting: Anesthesiology

## 2015-02-14 HISTORY — PX: INGUINAL HERNIA REPAIR: SHX194

## 2015-02-14 LAB — RENAL FUNCTION PANEL
ALBUMIN: 2.1 g/dL — AB (ref 3.5–5.0)
Anion gap: 11 (ref 5–15)
BUN: 20 mg/dL (ref 6–20)
CHLORIDE: 99 mmol/L — AB (ref 101–111)
CO2: 25 mmol/L (ref 22–32)
CREATININE: 6.16 mg/dL — AB (ref 0.61–1.24)
Calcium: 8 mg/dL — ABNORMAL LOW (ref 8.9–10.3)
GFR calc Af Amer: 11 mL/min — ABNORMAL LOW (ref >60)
GFR, EST NON AFRICAN AMERICAN: 10 mL/min — AB (ref >60)
Glucose, Bld: 73 mg/dL (ref 65–99)
PHOSPHORUS: 3.4 mg/dL (ref 2.5–4.6)
Potassium: 4.4 mmol/L (ref 3.5–5.1)
Sodium: 135 mmol/L (ref 135–145)

## 2015-02-14 LAB — GLUCOSE, CAPILLARY
GLUCOSE-CAPILLARY: 82 mg/dL (ref 65–99)
GLUCOSE-CAPILLARY: 59 mg/dL — AB (ref 65–99)
Glucose-Capillary: 100 mg/dL — ABNORMAL HIGH (ref 65–99)
Glucose-Capillary: 57 mg/dL — ABNORMAL LOW (ref 65–99)
Glucose-Capillary: 70 mg/dL (ref 65–99)
Glucose-Capillary: 77 mg/dL (ref 65–99)
Glucose-Capillary: 88 mg/dL (ref 65–99)

## 2015-02-14 LAB — CBC
HCT: 30 % — ABNORMAL LOW (ref 39.0–52.0)
Hemoglobin: 9.6 g/dL — ABNORMAL LOW (ref 13.0–17.0)
MCH: 27 pg (ref 26.0–34.0)
MCHC: 32 g/dL (ref 30.0–36.0)
MCV: 84.5 fL (ref 78.0–100.0)
Platelets: 222 10*3/uL (ref 150–400)
RBC: 3.55 MIL/uL — ABNORMAL LOW (ref 4.22–5.81)
RDW: 18.2 % — AB (ref 11.5–15.5)
WBC: 9.5 10*3/uL (ref 4.0–10.5)

## 2015-02-14 LAB — POCT I-STAT 4, (NA,K, GLUC, HGB,HCT)
Glucose, Bld: 77 mg/dL (ref 65–99)
HEMATOCRIT: 29 % — AB (ref 39.0–52.0)
HEMOGLOBIN: 9.9 g/dL — AB (ref 13.0–17.0)
Potassium: 5.5 mmol/L — ABNORMAL HIGH (ref 3.5–5.1)
Sodium: 132 mmol/L — ABNORMAL LOW (ref 135–145)

## 2015-02-14 LAB — PROTIME-INR
INR: 1.34 (ref 0.00–1.49)
PROTHROMBIN TIME: 16.7 s — AB (ref 11.6–15.2)

## 2015-02-14 SURGERY — REPAIR, HERNIA, INGUINAL, INCARCERATED
Anesthesia: General | Site: Groin | Laterality: Right

## 2015-02-14 MED ORDER — SODIUM CHLORIDE 0.9 % IV SOLN: 100.0000 mL | INTRAVENOUS | Status: DC | PRN

## 2015-02-14 MED ORDER — LABETALOL HCL 5 MG/ML IV SOLN
10.0000 mg | INTRAVENOUS | Status: AC | PRN
  Administered 2015-02-14 (×2): 10 mg via INTRAVENOUS

## 2015-02-14 MED ORDER — FENTANYL CITRATE (PF) 250 MCG/5ML IJ SOLN
INTRAMUSCULAR | Status: AC
  Filled 2015-02-14: qty 5

## 2015-02-14 MED ORDER — HYDROMORPHONE HCL 1 MG/ML IJ SOLN
INTRAMUSCULAR | Status: AC
  Administered 2015-02-14: 0.5 mg via INTRAVENOUS
  Filled 2015-02-14: qty 1

## 2015-02-14 MED ORDER — ARTIFICIAL TEARS OP OINT
TOPICAL_OINTMENT | OPHTHALMIC | Status: AC
  Filled 2015-02-14: qty 3.5

## 2015-02-14 MED ORDER — GLYCOPYRROLATE 0.2 MG/ML IJ SOLN
INTRAMUSCULAR | Status: DC | PRN
  Administered 2015-02-14: 0.6 mg via INTRAVENOUS

## 2015-02-14 MED ORDER — PHENYLEPHRINE 40 MCG/ML (10ML) SYRINGE FOR IV PUSH (FOR BLOOD PRESSURE SUPPORT)
PREFILLED_SYRINGE | INTRAVENOUS | Status: AC
  Filled 2015-02-14: qty 10

## 2015-02-14 MED ORDER — LABETALOL HCL 5 MG/ML IV SOLN
INTRAVENOUS | Status: AC
  Filled 2015-02-14: qty 4

## 2015-02-14 MED ORDER — LIDOCAINE HCL (CARDIAC) 20 MG/ML IV SOLN
INTRAVENOUS | Status: DC | PRN
  Administered 2015-02-14: 70 mg via INTRAVENOUS

## 2015-02-14 MED ORDER — LIDOCAINE HCL (CARDIAC) 20 MG/ML IV SOLN
INTRAVENOUS | Status: AC
  Filled 2015-02-14: qty 5

## 2015-02-14 MED ORDER — LABETALOL HCL 5 MG/ML IV SOLN
5.0000 mg | INTRAVENOUS | Status: DC | PRN
  Administered 2015-02-15 (×2): 5 mg via INTRAVENOUS
  Filled 2015-02-14: qty 4

## 2015-02-14 MED ORDER — PROPOFOL 10 MG/ML IV BOLUS
INTRAVENOUS | Status: AC
  Filled 2015-02-14: qty 20

## 2015-02-14 MED ORDER — HYDROMORPHONE HCL 1 MG/ML IJ SOLN
INTRAMUSCULAR | Status: AC
  Administered 2015-02-14: 14:00:00
  Filled 2015-02-14: qty 1

## 2015-02-14 MED ORDER — ROCURONIUM BROMIDE 50 MG/5ML IV SOLN
INTRAVENOUS | Status: AC
Start: 2015-02-14 — End: 2015-02-14
  Filled 2015-02-14: qty 1

## 2015-02-14 MED ORDER — ONDANSETRON HCL 4 MG/2ML IJ SOLN: 4.0000 mg | Freq: Once | INTRAMUSCULAR | Status: DC | PRN

## 2015-02-14 MED ORDER — HYDROMORPHONE HCL 1 MG/ML IJ SOLN
INTRAMUSCULAR | Status: AC
  Administered 2015-02-14: 1 mg via INTRAVENOUS
  Filled 2015-02-14: qty 1

## 2015-02-14 MED ORDER — GLYCOPYRROLATE 0.2 MG/ML IJ SOLN
INTRAMUSCULAR | Status: AC
  Filled 2015-02-14: qty 1

## 2015-02-14 MED ORDER — DEXTROSE 5 % IV SOLN
2.0000 g | INTRAVENOUS | Status: AC
  Administered 2015-02-14: 2 g via INTRAVENOUS
  Filled 2015-02-14: qty 2

## 2015-02-14 MED ORDER — NEOSTIGMINE METHYLSULFATE 10 MG/10ML IV SOLN
INTRAVENOUS | Status: DC | PRN
  Administered 2015-02-14: 4 mg via INTRAVENOUS

## 2015-02-14 MED ORDER — HEPARIN SODIUM (PORCINE) 1000 UNIT/ML DIALYSIS: 1000.0000 [IU] | INTRAMUSCULAR | Status: DC | PRN

## 2015-02-14 MED ORDER — NEOSTIGMINE METHYLSULFATE 10 MG/10ML IV SOLN
INTRAVENOUS | Status: AC
  Filled 2015-02-14: qty 1

## 2015-02-14 MED ORDER — ONDANSETRON HCL 4 MG/2ML IJ SOLN
INTRAMUSCULAR | Status: AC
  Filled 2015-02-14: qty 2

## 2015-02-14 MED ORDER — HEPARIN (PORCINE) IN NACL 100-0.45 UNIT/ML-% IJ SOLN
2400.0000 [IU]/h | INTRAMUSCULAR | Status: AC
  Administered 2015-02-14 – 2015-02-15 (×2): 2400 [IU]/h via INTRAVENOUS
  Filled 2015-02-14: qty 250

## 2015-02-14 MED ORDER — GLYCOPYRROLATE 0.2 MG/ML IJ SOLN
INTRAMUSCULAR | Status: AC
  Filled 2015-02-14: qty 2

## 2015-02-14 MED ORDER — SODIUM CHLORIDE 0.9 % IV SOLN
INTRAVENOUS | Status: DC
  Administered 2015-02-14: 09:00:00 via INTRAVENOUS

## 2015-02-14 MED ORDER — ONDANSETRON HCL 4 MG/2ML IJ SOLN
INTRAMUSCULAR | Status: DC | PRN
  Administered 2015-02-14: 4 mg via INTRAVENOUS

## 2015-02-14 MED ORDER — 0.9 % SODIUM CHLORIDE (POUR BTL) OPTIME
TOPICAL | Status: DC | PRN
  Administered 2015-02-14: 1000 mL

## 2015-02-14 MED ORDER — ROCURONIUM BROMIDE 100 MG/10ML IV SOLN
INTRAVENOUS | Status: DC | PRN
  Administered 2015-02-14: 25 mg via INTRAVENOUS
  Administered 2015-02-14: 10 mg via INTRAVENOUS

## 2015-02-14 MED ORDER — NEPRO/CARBSTEADY PO LIQD: 237.0000 mL | ORAL | Status: DC | PRN

## 2015-02-14 MED ORDER — ALTEPLASE 2 MG IJ SOLR
2.0000 mg | Freq: Once | INTRAMUSCULAR | Status: AC | PRN
  Filled 2015-02-14: qty 2

## 2015-02-14 MED ORDER — PROPOFOL 10 MG/ML IV BOLUS
INTRAVENOUS | Status: DC | PRN
  Administered 2015-02-14: 200 mg via INTRAVENOUS

## 2015-02-14 MED ORDER — BUPIVACAINE HCL (PF) 0.25 % IJ SOLN
INTRAMUSCULAR | Status: DC | PRN
  Administered 2015-02-14: 18 mL

## 2015-02-14 MED ORDER — FENTANYL CITRATE (PF) 100 MCG/2ML IJ SOLN
INTRAMUSCULAR | Status: DC | PRN
  Administered 2015-02-14: 100 ug via INTRAVENOUS

## 2015-02-14 MED ORDER — LIDOCAINE HCL (PF) 1 % IJ SOLN: 5.0000 mL | INTRAMUSCULAR | Status: DC | PRN

## 2015-02-14 MED ORDER — OXYCODONE HCL 5 MG PO TABS
ORAL_TABLET | ORAL | Status: AC
  Administered 2015-02-14: 14:00:00
  Filled 2015-02-14: qty 2

## 2015-02-14 MED ORDER — SODIUM CHLORIDE 0.9 % IR SOLN
Status: DC | PRN
  Administered 2015-02-14: 500 mL

## 2015-02-14 MED ORDER — HYDROMORPHONE HCL 1 MG/ML IJ SOLN
1.0000 mg | INTRAMUSCULAR | Status: DC | PRN
  Administered 2015-02-14 – 2015-02-16 (×8): 1 mg via INTRAVENOUS
  Filled 2015-02-14 (×7): qty 1

## 2015-02-14 MED ORDER — PENTAFLUOROPROP-TETRAFLUOROETH EX AERO: 1.0000 "application " | INHALATION_SPRAY | CUTANEOUS | Status: DC | PRN

## 2015-02-14 MED ORDER — POVIDONE-IODINE 10 % EX OINT
TOPICAL_OINTMENT | CUTANEOUS | Status: AC
  Filled 2015-02-14: qty 28.35

## 2015-02-14 MED ORDER — SUCCINYLCHOLINE CHLORIDE 20 MG/ML IJ SOLN
INTRAMUSCULAR | Status: AC
  Filled 2015-02-14: qty 1

## 2015-02-14 MED ORDER — EPHEDRINE SULFATE 50 MG/ML IJ SOLN
INTRAMUSCULAR | Status: DC | PRN
  Administered 2015-02-14: 5 mg via INTRAVENOUS
  Administered 2015-02-14 (×2): 10 mg via INTRAVENOUS

## 2015-02-14 MED ORDER — PHENYLEPHRINE HCL 10 MG/ML IJ SOLN
INTRAMUSCULAR | Status: DC | PRN
  Administered 2015-02-14 (×5): 80 ug via INTRAVENOUS

## 2015-02-14 MED ORDER — OXYCODONE HCL 5 MG PO TABS
ORAL_TABLET | ORAL | Status: AC
  Administered 2015-02-14: 10 mg via ORAL
  Filled 2015-02-14: qty 2

## 2015-02-14 MED ORDER — LABETALOL HCL 5 MG/ML IV SOLN
INTRAVENOUS | Status: AC
  Administered 2015-02-14: 12:00:00
  Filled 2015-02-14: qty 4

## 2015-02-14 MED ORDER — HYDROMORPHONE HCL 1 MG/ML IJ SOLN
0.5000 mg | INTRAMUSCULAR | Status: AC | PRN
  Administered 2015-02-14 (×4): 0.5 mg via INTRAVENOUS

## 2015-02-14 MED ORDER — LIDOCAINE-PRILOCAINE 2.5-2.5 % EX CREA
1.0000 | TOPICAL_CREAM | CUTANEOUS | Status: DC | PRN
Start: 2015-02-14 — End: 2015-02-15
  Filled 2015-02-14: qty 5

## 2015-02-14 SURGICAL SUPPLY — 59 items
ADH SKN CLS APL DERMABOND .7 (GAUZE/BANDAGES/DRESSINGS) ×1
BAG DECANTER FOR FLEXI CONT (MISCELLANEOUS) ×3 IMPLANT
BINDER ABD UNIV 12 45-62 (WOUND CARE) ×1 IMPLANT
BINDER ABDOMINAL 46IN 62IN (WOUND CARE) ×3
BLADE SURG ROTATE 9660 (MISCELLANEOUS) IMPLANT
CANISTER SUCTION 2500CC (MISCELLANEOUS) ×3 IMPLANT
CHLORAPREP W/TINT 26ML (MISCELLANEOUS) ×3 IMPLANT
CLEANER TIP ELECTROSURG 2X2 (MISCELLANEOUS) ×2 IMPLANT
CLOSURE STERI-STRIP 1/4X4 (GAUZE/BANDAGES/DRESSINGS) ×2 IMPLANT
CLOSURE WOUND 1/2 X4 (GAUZE/BANDAGES/DRESSINGS) ×1
COVER SURGICAL LIGHT HANDLE (MISCELLANEOUS) ×3 IMPLANT
DERMABOND ADVANCED (GAUZE/BANDAGES/DRESSINGS) ×2
DERMABOND ADVANCED .7 DNX12 (GAUZE/BANDAGES/DRESSINGS) IMPLANT
DRAIN PENROSE 3/4X12 (DRAIN) ×2 IMPLANT
DRAPE LAPAROSCOPIC ABDOMINAL (DRAPES) ×3 IMPLANT
DRAPE LAPAROTOMY TRNSV 102X78 (DRAPE) ×2 IMPLANT
DRAPE UTILITY XL STRL (DRAPES) ×6 IMPLANT
DRSG TEGADERM 4X4.75 (GAUZE/BANDAGES/DRESSINGS) ×2 IMPLANT
ELECT CAUTERY BLADE 6.4 (BLADE) ×3 IMPLANT
ELECT REM PT RETURN 9FT ADLT (ELECTROSURGICAL) ×3
ELECTRODE REM PT RTRN 9FT ADLT (ELECTROSURGICAL) ×1 IMPLANT
GAUZE SPONGE 4X4 12PLY STRL (GAUZE/BANDAGES/DRESSINGS) ×3 IMPLANT
GLOVE BIOGEL PI IND STRL 8 (GLOVE) ×1 IMPLANT
GLOVE BIOGEL PI INDICATOR 8 (GLOVE) ×2
GLOVE ECLIPSE 7.5 STRL STRAW (GLOVE) ×3 IMPLANT
GLOVE SURG SS PI 7.5 STRL IVOR (GLOVE) ×2 IMPLANT
GOWN STRL REUS W/ TWL LRG LVL3 (GOWN DISPOSABLE) ×3 IMPLANT
GOWN STRL REUS W/TWL LRG LVL3 (GOWN DISPOSABLE) ×9
KIT BASIN OR (CUSTOM PROCEDURE TRAY) ×3 IMPLANT
KIT ROOM TURNOVER OR (KITS) ×3 IMPLANT
MESH PROLENE 3X6 (Mesh General) ×2 IMPLANT
NDL HYPO 25GX1X1/2 BEV (NEEDLE) ×1 IMPLANT
NEEDLE HYPO 25GX1X1/2 BEV (NEEDLE) ×3 IMPLANT
NS IRRIG 1000ML POUR BTL (IV SOLUTION) ×3 IMPLANT
PACK GENERAL/GYN (CUSTOM PROCEDURE TRAY) ×3 IMPLANT
PAD ARMBOARD 7.5X6 YLW CONV (MISCELLANEOUS) ×1 IMPLANT
SPONGE INTESTINAL PEANUT (DISPOSABLE) ×2 IMPLANT
STAPLER VISISTAT 35W (STAPLE) IMPLANT
STRIP CLOSURE SKIN 1/2X4 (GAUZE/BANDAGES/DRESSINGS) ×1 IMPLANT
SUCTION POOLE TIP (SUCTIONS) ×2 IMPLANT
SUT ETHIBOND 0 MO6 C/R (SUTURE) ×2 IMPLANT
SUT ETHIBOND NAB CTX #1 30IN (SUTURE) IMPLANT
SUT MNCRL AB 4-0 PS2 18 (SUTURE) ×3 IMPLANT
SUT MON AB 4-0 PC3 18 (SUTURE) ×2 IMPLANT
SUT PROLENE 0 CT 2 (SUTURE) ×4 IMPLANT
SUT PROLENE 1 CT (SUTURE) IMPLANT
SUT SILK 2 0 (SUTURE) ×3
SUT SILK 2-0 18XBRD TIE 12 (SUTURE) ×1 IMPLANT
SUT VIC AB 2-0 CT1 27 (SUTURE)
SUT VIC AB 2-0 CT1 TAPERPNT 27 (SUTURE) IMPLANT
SUT VIC AB 3-0 CT1 27 (SUTURE) ×3
SUT VIC AB 3-0 CT1 TAPERPNT 27 (SUTURE) IMPLANT
SUT VIC AB 3-0 SH 27 (SUTURE) ×3
SUT VIC AB 3-0 SH 27X BRD (SUTURE) IMPLANT
SUT VICRYL AB 3 0 TIES (SUTURE) ×2 IMPLANT
SYR CONTROL 10ML LL (SYRINGE) IMPLANT
TOWEL OR 17X24 6PK STRL BLUE (TOWEL DISPOSABLE) ×3 IMPLANT
TOWEL OR 17X26 10 PK STRL BLUE (TOWEL DISPOSABLE) ×3 IMPLANT
TRAY FOLEY CATH 14FRSI W/METER (CATHETERS) IMPLANT

## 2015-02-14 NOTE — Progress Notes (Signed)
Patient ID: Frank [name], male   DOB: [birth year], 49 y.o.   MRN: [id number]     [address] [name], [place] [address]    Phone: [phone] FAX: [phone]     Subjective: Npo since MN.  Little flatus.  Pain.  Afebrile.   Objective:  Vital signs:  Filed Vitals:   02/13/15 1636 02/13/15 1945 02/13/15 2143 02/14/15 0421  BP: 146/94 145/101  149/98  Pulse: 75 79  75  Temp: 98 F (36.7 C) 98 F (36.7 C)  97.7 F (36.5 C)  TempSrc: Oral Oral  Oral  Resp: _0 Height:      Weight:   77.111 kg (170 lb)   SpO2: 98% 100%  98%    Last BM Date: 02/12/15  Intake/Output   Yesterday:  07/25 0701 - 07/26 0700 In: 873.6 [P.O.:600; I.V.:273.6] Out: 100 [Urine:100] This shift:    I/O last 3 completed shifts: In: 4008 [P.O.:840; I.V.:385] Out: 100 [Urine:100]    Physical Exam: General: Pt awake/alert/oriented x4 in no  acute distress Abdomen: Soft.  Nondistended.  Right inguinal bulge, painful, unable to reduce, no erythema. No evidence of peritonitis.  No incarcerated hernias.    Problem List:   Principal Problem:   Hyperkalemia Active Problems:   ESRD on dialysis- Tues, Thurs, Sat in [facility], nephro: [name]   Chronic combined systolic and diastolic CHF (congestive heart failure)   DM (diabetes mellitus), type 2, uncontrolled, with renal complications   Anemia of chronic disease   Dyslipidemia   HTN (hypertension)   Left upper extremity deep vein thrombosis    Results:   Labs: Results for orders placed or performed during the hospital encounter of 02/10/15 (from the past 48 hour(s))  Prepare fresh frozen plasma     Status: None   Collection Time: 02/12/15  9:58 AM  Result Value Ref Range   Unit Number [id number]    Blood Component Type THAWED PLASMA    Unit division 00    Status of Unit REL FROM [facility]    Transfusion Status OK TO TRANSFUSE    Unit Number [id number]    Blood  Component Type THAWED PLASMA    Unit division 00    Status of Unit REL FROM [facility]    Transfusion Status OK TO TRANSFUSE   Type and screen     Status: None   Collection Time: 02/12/15 11:07 AM  Result Value Ref Range   ABO/RH(D) B POS    Antibody Screen NEG    Sample Expiration 02/15/2015   Glucose, capillary     Status: None   Collection Time: 02/12/15 11:25 AM  Result Value Ref Range   Glucose-Capillary 79 65 - 99 mg/dL  Glucose, capillary     Status: Abnormal   Collection Time: 02/12/15  4:21 PM  Result Value Ref Range   Glucose-Capillary 111 (H) 65 - 99 mg/dL  Protime-INR     Status: Abnormal   Collection Time: 02/12/15  5:42 PM  Result Value Ref Range   Prothrombin Time 18.3 (H) 11.6 - 15.2 seconds   INR 1.52 (H) 0.00 - 1.49  Glucose, capillary     Status: Abnormal   Collection Time: 02/12/15  8:51 PM  Result Value Ref Range   Glucose-Capillary 120 (H) 65 - 99 mg/dL  Surgical pcr screen     Status: None   Collection Time: 02/12/15 11:03 PM  Result Value Ref Range   MRSA, PCR NEGATIVE NEGATIVE   Staphylococcus aureus NEGATIVE NEGATIVE    Comment:        The Xpert SA Assay (FDA approved for NASAL specimens in patients over 28 years of age), is one component of a comprehensive surveillance program.  Test performance has been validated by [facility] for patients greater than or equal to 62 year old. It is not intended to diagnose infection nor to guide or monitor treatment.   Protime-INR     Status: Abnormal   Collection Time: 02/13/15  5:39 AM  Result Value Ref Range   Prothrombin Time 17.3 (H) 11.6 - 15.2 seconds   INR 1.40 0.00 - 1.49  CBC     Status: Abnormal   Collection Time: 02/13/15  5:39 AM  Result Value Ref Range   WBC 7.0 4.0 - 10.5 K/uL   RBC 3.08 (L) 4.22 - 5.81 MIL/uL   Hemoglobin 8.4 (L) 13.0 - 17.0 g/dL   HCT 26.0 (L) 39.0 - 52.0 %   MCV 84.4 78.0 - 100.0 fL   MCH 27.3 26.0 - 34.0 pg   MCHC 32.3 30.0 - 36.0 g/dL   RDW 18.8 (H) 11.5 - 15.5 %    Platelets 207 150 - 400 K/uL  Comprehensive metabolic panel     Status: Abnormal   Collection Time: 02/13/15  5:39 AM  Result Value Ref Range   Sodium 130 (L) 135 - 145 mmol/L   Potassium 4.7 3.5 - 5.1 mmol/L   Chloride 95 (L) 101 - 111 mmol/L   CO2 26 22 - 32 mmol/L   Glucose, Bld 104 (H) 65 - 99 mg/dL   BUN 47 (H) 6 - 20 mg/dL   Creatinine, Ser 10.08 (H) 0.61 - 1.24 mg/dL   Calcium 7.3 (L) 8.9 - 10.3 mg/dL   Total Protein 6.2 (L) 6.5 - 8.1 g/dL   Albumin 1.9 (L) 3.5 - 5.0 g/dL   AST 50 (H) 15 - 41 U/L   ALT 14 (L) 17 - 63 U/L   Alkaline Phosphatase 94 38 - 126 U/L   Total Bilirubin 0.4 0.3 - 1.2 mg/dL   GFR calc non Af Amer 5 (L) >60 mL/min   GFR calc Af Amer 6 (L) >60 mL/min    Comment: (NOTE) The eGFR has been calculated using the CKD EPI equation. This calculation has not been validated in all clinical situations. eGFR's persistently <60 mL/min signify possible Chronic Kidney Disease.    Anion gap 9 5 - 15  Glucose, capillary     Status: None   Collection Time: 02/13/15  7:34 AM  Result Value Ref Range   Glucose-Capillary 83 65 - 99 mg/dL  Glucose, capillary     Status: Abnormal   Collection Time: 02/13/15 11:17 AM  Result Value Ref Range   Glucose-Capillary 104 (H) 65 - 99 mg/dL  Glucose, capillary     Status: Abnormal   Collection Time: 02/13/15  4:33 PM  Result Value Ref Range   Glucose-Capillary 110 (H) 65 - 99 mg/dL  Heparin level (unfractionated)     Status: Abnormal   Collection Time: 02/13/15  7:04 PM  Result Value Ref Range   Heparin Unfractionated 0.26 (L) 0.30 - 0.70 IU/mL    Comment:        IF HEPARIN RESULTS ARE BELOW EXPECTED VALUES, AND PATIENT DOSAGE HAS BEEN CONFIRMED, SUGGEST FOLLOW UP TESTING OF ANTITHROMBIN III LEVELS.   Glucose, capillary     Status: Abnormal  Collection Time: 02/13/15  7:37 PM  Result Value Ref Range   Glucose-Capillary 111 (H) 65 - 99 mg/dL  Glucose, capillary     Status: Abnormal   Collection Time: 02/13/15 11:58  PM  Result Value Ref Range   Glucose-Capillary 100 (H) 65 - 99 mg/dL  Glucose, capillary     Status: None   Collection Time: 02/14/15  4:16 AM  Result Value Ref Range   Glucose-Capillary 88 65 - 99 mg/dL  Protime-INR     Status: Abnormal   Collection Time: 02/14/15  5:19 AM  Result Value Ref Range   Prothrombin Time 16.7 (H) 11.6 - 15.2 seconds   INR 1.34 0.00 - 1.49    Imaging / Studies: Ct Abdomen Pelvis Wo Contrast  02/12/2015   CLINICAL DATA:  Acute onset right groin pain 1 day ago. Patient reports a right groin lesion. Initial encounter.  EXAM: CT ABDOMEN AND PELVIS WITHOUT CONTRAST  TECHNIQUE: Multidetector CT imaging of the abdomen and pelvis was performed following the standard protocol without IV contrast.  COMPARISON:  CT abdomen and pelvis 11/05/2014.  FINDINGS: The patient has small bilateral pleural effusions which are new since the prior exam. Mild dependent atelectasis is identified. There is cardiomegaly and small pericardial effusion, unchanged.  There is a small amount of abdominal and pelvic free-fluid. Catheter for peritoneal dialysis is identified. The patient has a right inguinal hernia containing free fluid. Its appearance is unchanged.  The kidneys are markedly atrophic consistent with end-stage renal disease. Renal transplant in the left lower quadrant is unchanged in appearance. There is extensive aortoiliac atherosclerosis without aneurysm. The spleen, pancreas, liver, gallbladder and adrenal glands are unremarkable. No lymphadenopathy is seen. No bony abnormality is identified.  IMPRESSION: No acute finding.  Right inguinal hernia containing free fluid is unchanged. Note is made that the patient is on peritoneal dialysis accounting for small volume of fluid in the abdomen and pelvis.  Small bilateral pleural effusions, new since the prior study.  No change in cardiomegaly and a small pericardial effusion.   Electronically Signed   By: Inge Rise M.D.   On: 02/12/2015  10:34    Medications / Allergies:  Scheduled Meds: . sodium chloride   Intravenous Once  . atorvastatin  40 mg Oral q1800  . carvedilol  25 mg Oral BID WC  . cefOXitin  2 g Intravenous To SS-Surg  . cinacalcet  30 mg Oral Q breakfast  . docusate sodium  100 mg Oral BID  . hydrALAZINE  50 mg Oral 3 times per day  . insulin aspart  0-5 Units Subcutaneous QHS  . insulin aspart  0-9 Units Subcutaneous TID WC  . multivitamin  1 tablet Oral QHS  . pantoprazole (PROTONIX) IV  40 mg Intravenous Q24H  . polyethylene glycol  17 g Oral BID  . predniSONE  5 mg Oral Q breakfast  . sevelamer carbonate  1,600 mg Oral TID WC  . sodium chloride  3 mL Intravenous Q12H  . sodium polystyrene  60 g Oral Once   Continuous Infusions:  PRN Meds:.sodium chloride, sodium chloride, ALPRAZolam, heparin, heparin, HYDROmorphone (DILAUDID) injection, levalbuterol, lidocaine (PF), lidocaine-prilocaine, ondansetron, oxyCODONE, pentafluoroprop-tetrafluoroeth, sevelamer carbonate  Antibiotics: Anti-infectives    Start     Dose/Rate Route Frequency Ordered Stop   02/14/15 0915  cefOXitin (MEFOXIN) 2 g in dextrose 5 % 50 mL IVPB    Comments:  Pharmacy may adjust dosing strength, interval, or rate of medication as needed for optimal therapy for  the patient Send with patient on call to the OR.  Anesthesia to complete antibiotic administration <58mn prior [facility]   2 g 100 mL/hr over 30 Minutes [facility] 02/14/15 0525807/27/16 0915        Assessment/Plan Incarcerated right inguinal hernia -to OR this AM for repair.  Surgical risks discussed including but not limited to infection, bleeding, injury to bowel, risk of recurrence, anesthesia risks such as heart attack, DVT/PE, respiratory failure and death.  The patient verbalizes understanding and wishes to proceed.  -obtain consent -heparin on hold since midnight -cefoxitin on call to OR  Off note, the patient wants  to go home by Thursday. 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  He does not do any of the work, more so delegates which he can do post op as long as he isn't lifting/pushing/pulling >10lbs.  I told him this would be up to his primary team because of his coumadin, but unlikely that he will be ready by then. He told me to be more optimistic.   EErby Pian [facility] Pager [phone] For consults and floor pages call [phone](7A-4:30P)  02/14/2015 7:53 AM

## 2015-02-14 NOTE — Progress Notes (Signed)
pts bp remains 181/113 consulted with dr Tamala Julian ok to return pt to [id number] with this pressure.  start taking bp pills ordered on floor and pt also to go to dialysis  Report called to [employer]

## 2015-02-14 NOTE — Progress Notes (Signed)
Pt returned to floor,recieved report from OR RN's. Pt received xanax and hydralazine po and left for HD, back in room approx. 10-13 minutes. 02/14/2015 1:54 PM Johnette Teigen

## 2015-02-14 NOTE — Anesthesia Procedure Notes (Signed)
Procedure Name: Intubation Date/Time: 02/14/2015 9:49 AM Performed by: Laretta Alstrom Pre-anesthesia Checklist: Patient identified, Emergency Drugs available, Suction available, Patient being monitored and Timeout performed Patient Re-evaluated:Patient Re-evaluated prior to inductionOxygen Delivery Method: Circle system utilized Preoxygenation: Pre-oxygenation with 100% oxygen Ventilation: Mask ventilation without difficulty and Oral airway inserted - appropriate to patient size Laryngoscope Size: Mac and 3 Grade View: Grade II Tube type: Oral Tube size: 7.5 mm Number of attempts: 1 Airway Equipment and Method: Stylet Placement Confirmation: ETT inserted through vocal cords under direct vision,  positive ETCO2 and breath sounds checked- equal and bilateral Tube secured with: Tape Dental Injury: Teeth and Oropharynx as per pre-operative assessment

## 2015-02-14 NOTE — Anesthesia Postprocedure Evaluation (Signed)
  Anesthesia Post-op Note  Patient: Frank [name]  Procedure(s) Performed: Procedure(s): REPAIR INCARCERATED RIGHT INGUINAL HERNIA (Right)  Patient Location: PACU  Anesthesia Type:General  Level of Consciousness: awake, alert , oriented and patient cooperative  Airway and Oxygen Therapy: Patient Spontanous Breathing  Post-op Pain: mild, moderate  Post-op Assessment: Post-op Vital signs reviewed, Patient's Cardiovascular Status Stable, Respiratory Function Stable, Patent Airway, No signs of Nausea or vomiting and Pain level controlled              Post-op Vital Signs: stable  Last Vitals:  Filed Vitals:   02/14/15 1200  BP: 178/116  Pulse:   Temp:   Resp:     Complications: No apparent anesthesia complications

## 2015-02-14 NOTE — Transfer of Care (Signed)
Immediate Anesthesia Transfer of Care Note  Patient: Frank [name]  Procedure(s) Performed: Procedure(s): REPAIR INCARCERATED RIGHT INGUINAL HERNIA (Right)  Patient Location: PACU  Anesthesia Type:General  Level of Consciousness: awake and patient cooperative  Airway & Oxygen Therapy: Patient Spontanous Breathing and Patient connected to nasal cannula oxygen  Post-op Assessment: Report given to RN  Post vital signs: Reviewed and stable  Last Vitals:  Filed Vitals:   02/14/15 1145  BP: 174/119  Pulse: 76  Temp:   Resp:     Complications: No apparent anesthesia complications

## 2015-02-14 NOTE — Anesthesia Preprocedure Evaluation (Addendum)
Anesthesia Evaluation  Patient identified by MRN, date of birth, ID band Patient awake    Reviewed: Allergy & Precautions, NPO status , Patient's Chart, lab work & pertinent test results  Airway Mallampati: I       Dental  (+) Teeth Intact, Dental Advisory Given   Pulmonary former smoker,    Pulmonary exam normal       Cardiovascular hypertension, + Peripheral Vascular Disease and +CHF Normal cardiovascular examRhythm:Regular     Neuro/Psych Depression    GI/Hepatic   Endo/Other  diabetes, Type 2  Renal/GU ESRF and DialysisRenal disease     Musculoskeletal   Abdominal   Peds  Hematology  (+) anemia ,   Anesthesia Other Findings   Reproductive/Obstetrics                           Anesthesia Physical Anesthesia Plan  ASA: III  Anesthesia Plan: General   Post-op Pain Management:    Induction: Intravenous  Airway Management Planned: Oral ETT  Additional Equipment:   Intra-op Plan:   Post-operative Plan: Extubation in OR  Informed Consent: I have reviewed the patients History and Physical, chart, labs and discussed the procedure including the risks, benefits and alternatives for the proposed anesthesia with the patient or authorized representative who has indicated his/her understanding and acceptance.     Plan Discussed with: CRNA, Anesthesiologist and Surgeon  Anesthesia Plan Comments:         Anesthesia Quick Evaluation

## 2015-02-14 NOTE — Progress Notes (Signed)
Spoke to dr Tamala Julian regarding pt bp 181/112 ok to return to room and take po bp pills ordered on floor and pt to go dialysis

## 2015-02-14 NOTE — Op Note (Signed)
OPERATIVE REPORT  DATE OF OPERATION:  02/14/2015  PATIENT:  Frank [name]  49 y.o. male  PRE-OPERATIVE DIAGNOSIS:  Incarcerated RIGHT inguinal hernia  POST-OPERATIVE DIAGNOSIS:  Incarcerated RIGHT inguinal hernia, communicating hydrocele  PROCEDURE:  Procedure(s): REPAIR INCARCERATED RIGHT INGUINAL HERNIA  SURGEON:  Surgeon(s): [name], MD  ASSISTANT: None  ANESTHESIA:   general  EBL: <20 ml  BLOOD ADMINISTERED: none  DRAINS: none   SPECIMEN:  No Specimen  COUNTS CORRECT:  YES  PROCEDURE DETAILS: The patient was taken to the operating room and placed on the table in supine position. After an adequate general endotracheal anesthesia was administered, he was prepped and draped in usual sterile manner exposing his right inguinal area.  A proper timeout was performed identifying the patient and procedure to be performed. A right transverse curvilinear incision was made at the level of superficial ring. Was taken down into the subcutaneous tissue with electrocautery. A subcutaneous vein was ligated using 30 by Milinda Cave. We isolated the external oblique fascia and then opened the fascia through its fibers down through the superficial ring.  The spermatic cord was isolated at the pubic tubercle with a Penrose drain. We subsequently lifted up and dissected out the hernia sac on the anterior medial aspect of the cord from the spermatic cord. We dissected a core away from the what appeared to be large right inguinal hernia sac which was thickened and scarred in and likely to been there for some time.  We opened the hernia sac where there is a large amount of peritoneal fluid likely representing a small indirect inguinal hernia and a communicating hydrocele.  We ligated the hernia sac at his pain is using 2 suture ligatures of 0 Ethibond suture. We then repaired the floor of the hernia using an oval piece of mesh measuring approximately 5 x 3 cm in size attachment to the pubic tubercle, the  conjoined tendon anterior medially, and the reflected portion of the inguinal ligament inferolaterally.  The mesh was sutured in place using 0 Prolene suture. The mesh and then soaked in and about solution and was subsequently irrigated with anabiotic solution. Care was taken not to entrap the iliohypogastric with the ilioinguinal nerve doing repair. The iliohypogastric nerve was actually taking with electrocautery at its base.  The external oblique fascia was reapproximated on top of the spermatic cord using running 3-0 by Vicryl suture. Scarpa's fascia was reapproximated using interrupted 3-0 Vicryl suture. 0.25% Marcaine was injected into the subcutaneous tissue and also near the anterior superior iliac spine. The skin was closed using running subcuticular stitch of 3-0 Monocryl. All needle counts, sponge counts, and instrument counts were correct. Dermabond, Steri-Strips, and Tegaderm used to complete the dressing.[name]  PATIENT DISPOSITION:  PACU - hemodynamically stable.   Frank [name] 7/26/201611:09 AM

## 2015-02-14 NOTE — Progress Notes (Signed)
[facility] Progress Note  Assessment/Plan: 1. Hyperkalemia: Revolved. 2. S/P Incarcerated right inguinal hernia repair : per Dr. Hulen Skains. Steri Strips intact. A & O after procedure.  2. ESRD - Currently on HD. UFG 2500. Tolerating well.  3. Anemia - HGB 8.4 Follow CBCs.   4. Secondary hyperparathyroidism -  5. HTN/volume - Had HD 02/11/15 Net UF 3770. Post Wt. 76.4 Pre wt today: 79.3 kg. Hypertensive but pt is insisting HTN is due to pain rather than fld. No edema. Will challenge if BP doesn't start to come down. 6. Nutrition - NPO post op. Resume renal diet when appropriate.  7. Abdominal Pain: Chronic: Per primary 7.  UE DVT on heparin gtt and warfarin; Per primary and on warfarin  Rita H. Brown NP-C 02/14/2015, 2:08 PM  [facility] [phone]  Pt seen, examined and agree w A/P as above.  Kelly Splinter MD pager [phone]    cell [phone] 02/14/2015, 4:32 PM    Subjective:  "My incision hurts-ya'll don't need to take off too much fluid-if my blood pressure is high, it's because I'm hurting". Recently returned from OR for repair of incarcerated inguinal hernia. Denies SOB. C/O numbness R foot which is resolving as foot is warmed.    Objective Filed Vitals:   02/14/15 1215 02/14/15 1230 02/14/15 1245 02/14/15 1300  BP: 181/126 176/114 181/110 181/112  Pulse: 77  75   Temp:    97.7 F (36.5 C)  TempSrc:      Resp:      Height:      Weight:      SpO2:       Physical Exam General: Well nourished male NAD.  Heart: S1, S2, RRR. No M/R/G Lungs: Bilateral breath sounds CTA Abdomen: soft, nontender. Very hypoactive BS at present. Steristrips RLG C/D/I Extremities: No edema. Pulses present Dialysis Access: R. Tunneled perm cath   Dialysis Orders: TTS HD at [facility] 76 kg dry wt  Additional Objective Labs: Basic Metabolic Panel:  Recent Labs Lab 02/11/15 1833 02/12/15 0606 02/13/15 0539 02/14/15 0904  NA 133* 134* 130* 132*   K 4.9 4.8 4.7 5.5*  CL 97* 97* 95*  --   CO2 _0 --   GLUCOSE 111* 134* 104* 77  BUN 27* 35* 47*  --   CREATININE 6.77* 8.09* 10.08*  --   CALCIUM 7.3* 7.6* 7.3*  --   PHOS 4.1  --   --   --    Liver Function Tests:  Recent Labs Lab 02/11/15 0442 02/11/15 1833 02/12/15 0606 02/13/15 0539  AST 63*  --  52* 50*  ALT 20  --  15* 14*  ALKPHOS 95  --  85 94  BILITOT 0.8  --  0.5 0.4  PROT 6.1*  --  5.9* 6.2*  ALBUMIN 2.1* 1.9* 1.8* 1.9*    Recent Labs Lab 02/10/15 2136  LIPASE 17*   CBC:  Recent Labs Lab 02/10/15 2123 02/11/15 0442 02/12/15 0606 02/13/15 0539 02/14/15 0904  WBC 9.6 10.8* 7.4 7.0  --   HGB 9.0* 8.8* 8.0* 8.4* 9.9*  HCT 27.8* 27.8* 24.8* 26.0* 29.0*  MCV 84.5 84.2 84.9 84.4  --   PLT 229 214 184 207  --    Blood Culture    Component Value Date/Time   SDES BLOOD LEFT HAND 10/07/2014 0725   SPECREQUEST BOTTLES DRAWN AEROBIC AND ANAEROBIC 5CCS 10/07/2014 0725   CULT  10/07/2014 0725    NO GROWTH 5  DAYS Performed at [facility] 10/13/2014 FINAL 10/07/2014 0725    Cardiac Enzymes:  Recent Labs Lab 02/11/15 0442 02/11/15 1833  TROPONINI 0.09* 0.07*   CBG:  Recent Labs Lab 02/13/15 1937 02/13/15 2358 02/14/15 0416 02/14/15 0816 02/14/15 1107  GLUCAP 111* 100* 88 82 77   Iron Studies: No results for input(s): IRON, TIBC, TRANSFERRIN, FERRITIN in the last 72 hours. _0 @ Studies/Results: No results found. Medications: . sodium chloride 10 mL/hr at 02/14/15 0901   . sodium chloride   Intravenous Once  . atorvastatin  40 mg Oral q1800  . carvedilol  25 mg Oral BID WC  . cinacalcet  30 mg Oral Q breakfast  . docusate sodium  100 mg Oral BID  . hydrALAZINE  50 mg Oral 3 times per day  . insulin aspart  0-5 Units Subcutaneous QHS  . insulin aspart  0-9 Units Subcutaneous TID WC  . multivitamin  1 tablet Oral QHS  . pantoprazole (PROTONIX) IV  40 mg Intravenous Q24H  . polyethylene glycol   17 g Oral BID  . predniSONE  5 mg Oral Q breakfast  . sevelamer carbonate  1,600 mg Oral TID WC  . sodium chloride  3 mL Intravenous Q12H  . sodium polystyrene  60 g Oral Once

## 2015-02-14 NOTE — Progress Notes (Addendum)
[place] for Heparin Indication: pulmonary embolus and DVT  Allergies  Allergen Reactions  . Ferrlecit [Na Ferric Gluc Cplx In Sucrose] Shortness Of Breath, Swelling and Other (See Comments)    Swelling in throat  . Darvocet [Propoxyphene N-Acetaminophen] Hives    Patient Measurements: Height: _0  (188 cm) Weight: 174 lb 13.2 oz (79.3 kg) IBW/kg (Calculated) : 82.2 Heparin Dosing Weight: 75.9kg  Vital Signs: Temp: 97.7 F (36.5 C) (07/26 1300) Temp Source: Oral (07/26 0421) BP: 150/95 mmHg (07/26 1415) Pulse Rate: 78 (07/26 1415)  Labs:  Recent Labs  02/11/15 1833  02/12/15 0606 02/12/15 1742 02/13/15 0539 02/13/15 1904 02/14/15 0519 02/14/15 0904  HGB  --   < > 8.0*  --  8.4*  --   --  9.9*  HCT  --   --  24.8*  --  26.0*  --   --  29.0*  PLT  --   --  184  --  207  --   --   --   LABPROT  --   < > 22.8* 18.3* 17.3*  --  16.7*  --   INR  --   < > 2.02* 1.52* 1.40  --  1.34  --   HEPARINUNFRC 0.19*  --   --   --   --  0.26*  --   --   CREATININE 6.77*  --  8.09*  --  10.08*  --   --   --   TROPONINI 0.07*  --   --   --   --   --   --   --   < > = values in this interval not displayed.  Estimated Creatinine Clearance: 9.8 mL/min (by C-G formula based on Cr of 10.08).   Medical History: Past Medical History  Diagnosis Date  . Hypertension   . Depression   . Complication of anesthesia     itching, sore throat  . Diabetes mellitus without complication     No history per patient, but remains under history as A1c would not be accurate given on dialysis  . Shortness of breath   . Anxiety   . ESRD (end stage renal disease)     due to HTN per patient, followed at [facility], s/p failed kidney transplant - dialysis Tue, Th, Sat  . Renal insufficiency   . CHF (congestive heart failure)   . Anemia   . Dialysis patient     Medications:  Scheduled:  . sodium chloride   Intravenous Once  . atorvastatin  40 mg Oral q1800  .  carvedilol  25 mg Oral BID WC  . cinacalcet  30 mg Oral Q breakfast  . docusate sodium  100 mg Oral BID  . hydrALAZINE  50 mg Oral 3 times per day  . insulin aspart  0-5 Units Subcutaneous QHS  . insulin aspart  0-9 Units Subcutaneous TID WC  . multivitamin  1 tablet Oral QHS  . pantoprazole (PROTONIX) IV  40 mg Intravenous Q24H  . polyethylene glycol  17 g Oral BID  . predniSONE  5 mg Oral Q breakfast  . sevelamer carbonate  1,600 mg Oral TID WC  . sodium chloride  3 mL Intravenous Q12H  . sodium polystyrene  60 g Oral Once    Assessment: 49yo male continuing on Heparin until AM in setting of Acute DVT as well as chronic PE/R-IJ clot (warfarin pta on hold). Hg 8.4 stable, pltc wnl stable. Previously scheduled  for OR today and Coum d/c'd & Vit K 64m IV given 7/24. Surgery cannot get to him today. To restart heparin until AM on 7/26 and continue to hold warfarin. INR 1.4 today. Hg stable 8.4, plt stable wnl. No bleed documented. Most recent HL 0.26, which is SUBtherapeutic.    Goal of Therapy:  Heparin level 0.3-0.7 units/ml Monitor platelets by anticoagulation protocol: Yes   Plan:  Will restart heparin at previous dose of 2400u/hr starting at 1900 tonight (7/26) per surgery. 8h HL  Hold warfarin dose for today per surgery.  Daily HL/CBC/INR Mon s/sx bleeding   Lou Loewe C. MLennox Grumbles PharmD Pharmacy Resident  Pager: [phone]/26/2016 2:55 PM

## 2015-02-14 NOTE — Progress Notes (Signed)
Frank [name] PROGRESS NOTE  TRAFTON [name] [name]:[id number] DOB: 1966/03/25 DOA: 02/10/2015 PCP: Angelica Chessman, MD  Assessment/Plan: Principal Problem:   Hyperkalemia Active Problems:   ESRD on dialysis- Tues, Thurs, Sat in [facility], nephro: [name]   Chronic combined systolic and diastolic CHF (congestive heart failure)   DM (diabetes mellitus), type 2, uncontrolled, with renal complications   Anemia of chronic disease   Dyslipidemia   HTN (hypertension)   Left upper extremity deep vein thrombosis     ESRD , hemodialysis Tuesday Thursday and Friday at [facility] - Received insulin, bicarb, calcium, glucose, ESRD for  hyperkalemia Appreciate nephrology input   Scrotal pain, suspected incarcerated and while hernia Seen with [name], MD, patient has a CT abdomen pelvis ordered, -to OR this AM for repair  Reversed warfarin coagulopathy with FFP and vitamin K Repeat INR 1.34, RESUME  heparin gtt when OK with surgery  RESTART HEPARIN AFTER SURGERY BASED ON HIS BLEEDING RISK POST OP -DR WAKEFIELD TO ADVISE  Continue  iv dilaudid for pain   Left upper extremity deep vein thrombosis, RIJ thrombus Cont  heparin drip until INR therapeutic prior to discharge   Abdominal pain, suspected incarcerated inguinal hernia Thought to be secondary to constipation, however abdominal pain has resolved and the patient developed scrotal pain due to incarcerated inguinal hernia Management as above   Elevated Troponin Unchanged and flat in the setting of ESRD Chest pain-free,    Chronic combined systolic and diastolic CHF (congestive heart failure) - Per previous notes, this is managed with HD - HD will be ordered by Nephrology   DM (diabetes mellitus), type 2, uncontrolled, with renal complications - SSI ordered   Anemia of chronic disease CBC stable, transfuse during hemodialysis if needed   HTN (hypertension) Continue Coreg and hydralazine    Code  Status:      Code Status Orders        Start     Ordered   02/11/15 0210  Full code   Continuous     02/11/15 0209     Family Communication: family updated about patient's clinical progress Disposition Plan:  Discharge patient when INR is therapeutic  Brief narrative: 49yo man with extensive PMH, most importantly recent admission for LUE DVT per last discharge summary as well as chronic right IJ clot and chronic PE. He also has ESRD and is on HD. He was recently admitted as noted below. He also complains of chronic chest pain.   Admitted 7/12 - 7/16 at [facility] - found to have LUE DVT. On coumadin. Continued to have CP, thought to be MSK in nature. Also at that time with accelerated HTN due to noncompliance with medications.   Seen at [facility] 02/07/15 - 02/09/15: Swelling in arm and hypertensive urgency. Found to have chronic clot in the Right IJ. He was sent home with heparin injections b/c he insisted on leaving the hospital. He was given coumadin as well. He had HD twice while at [facility]. He had a subtherapeutic INR at the time of admission. Per notes, he insisted on discharge and was given injectable heparin and coumadin to go home with.   Today, his main symptom was a knot in his stomach. He notes that he thinks the clot in his arm or neck moved into his lungs and then into his stomach. He is very concerned that the clots are moving all over his body. He told me that he had a  syncopal episode today in his car and had to be pulled out of his car "by strangers." He did not tell the same story to the ED providers. He said the syncope was brought on by the clots moving in to his abdomen. He has other chronic pains including in his right shoulder, which also "moves all around" and chest pain which is reproducible on palpation. Further symptoms include fatigue, epigastric pain, decreased PO intake. He reports taking his medications including injectable heparin given to him  at discharge.   Objective findings in the ED showed hyperkalemia with K of 6.6, Creatinine increase to 10.75. Lipase was normal. AST was elevated to 61 (3:1 ratio AST to ALT). His INR was 1.49. H/H were stable, chronically low. Troponin I was 0.12, however, he has CKD.   Consultants:  None  Procedures:  *None  Antibiotics: Anti-infectives    Start     Dose/Rate Route Frequency Ordered Stop   02/14/15 1011  polymyxin B 500,000 Units, bacitracin 50,000 Units in sodium chloride irrigation 0.9 % 500 mL irrigation       As needed 02/14/15 1011     02/14/15 0915  [MAR Hold]  cefOXitin (MEFOXIN) 2 g in dextrose 5 % 50 mL IVPB     (MAR Hold since 02/14/15 0847)  Comments:  Pharmacy may adjust dosing strength, interval, or rate of medication as needed for optimal therapy for the patient Send with patient on call to the OR.  Anesthesia to complete antibiotic administration <68mn prior to [facility]   2 g 100 mL/hr over 30 Minutes [facility] 02/14/15 0709 02/14/15 0940         HPI/Subjective:  patient appears very comfortable, nothing by mouth for surgery, denies abdominal pain   Objective: Filed Vitals:   02/13/15 1636 02/13/15 1945 02/13/15 2143 02/14/15 0421  BP: 146/94 145/101  149/98  Pulse: 75 79  75  Temp: 98 F (36.7 C) 98 F (36.7 C)  97.7 F (36.5 C)  TempSrc: Oral Oral  Oral  Resp: _0 Height:      Weight:   77.111 kg (170 lb)   SpO2: 98% 100%  98%    Intake/Output Summary (Last 24 hours) at 02/14/15 1053 Last data filed at 02/14/15 0827  Gross per 24 hour  Intake    780 ml  Output    100 ml  Net    680 ml    Exam:  General: No acute respiratory distress Lungs: Clear to auscultation bilaterally without wheezes or crackles Cardiovascular: Regular rate and rhythm without murmur gallop or rub normal S1 and S2 Abdomen: Nontender, nondistended, soft, bowel sounds positive, no rebound, no ascites, no  appreciable mass Extremities: No significant cyanosis, clubbing, or edema bilateral lower extremities     Data Review   Micro Results Recent Results (from the past 240 hour(s))  Surgical pcr screen     Status: None   Collection Time: 02/12/15 11:03 PM  Result Value Ref Range Status   MRSA, PCR NEGATIVE NEGATIVE Final   Staphylococcus aureus NEGATIVE NEGATIVE Final    Comment:        The Xpert SA Assay (FDA approved for NASAL specimens in patients over 273years of age), is one component of a comprehensive surveillance program.  Test performance has been validated by [facility] patients greater than or equal to 176year old. It is not intended to diagnose infection nor to guide or monitor treatment.  Radiology Reports Ct Abdomen Pelvis Wo Contrast  02/12/2015   CLINICAL DATA:  Acute onset right groin pain 1 day ago. Patient reports a right groin lesion. Initial encounter.  EXAM: CT ABDOMEN AND PELVIS WITHOUT CONTRAST  TECHNIQUE: Multidetector CT imaging of the abdomen and pelvis was performed following the standard protocol without IV contrast.  COMPARISON:  CT abdomen and pelvis 11/05/2014.  FINDINGS: The patient has small bilateral pleural effusions which are new since the prior exam. Mild dependent atelectasis is identified. There is cardiomegaly and small pericardial effusion, unchanged.  There is a small amount of abdominal and pelvic free-fluid. Catheter for peritoneal dialysis is identified. The patient has a right inguinal hernia containing free fluid. Its appearance is unchanged.  The kidneys are markedly atrophic consistent with end-stage renal disease. Renal transplant in the left lower quadrant is unchanged in appearance. There is extensive aortoiliac atherosclerosis without aneurysm. The spleen, pancreas, liver, gallbladder and adrenal glands are unremarkable. No lymphadenopathy is seen. No bony abnormality is identified.  IMPRESSION: No acute finding.  Right  inguinal hernia containing free fluid is unchanged. Note is made that the patient is on peritoneal dialysis accounting for small volume of fluid in the abdomen and pelvis.  Small bilateral pleural effusions, new since the prior study.  No change in cardiomegaly and a small pericardial effusion.   Electronically Signed   By: Inge Rise M.D.   On: 02/12/2015 10:34   Dg Chest 2 View  02/10/2015   CLINICAL DATA:  Acute onset of generalized chest pain and lethargy. Shortness of breath. Initial encounter.  EXAM: CHEST  2 VIEW  COMPARISON:  Chest radiograph and CTA of the chest performed 01/31/2015  FINDINGS: The lungs are well-aerated. Mild vascular congestion is noted. There is no evidence of focal opacification, pleural effusion or pneumothorax.  The heart is mildly enlarged. A right-sided dual-lumen catheter is noted ending within the right atrium. No acute osseous abnormalities are seen.  IMPRESSION: Mild vascular congestion and mild cardiomegaly noted. Lungs remain grossly clear.   Electronically Signed   By: Garald Balding M.D.   On: 02/10/2015 21:51   Dg Chest 2 View  01/31/2015   CLINICAL DATA:  Shortness of breath. Left-sided chest pain. Dialysis patient. History of blood clots.  EXAM: CHEST  2 VIEW  COMPARISON:  01/26/2015  FINDINGS: Mild cardiac enlargement without significant vascular congestion. Mediastinal contours appear intact. No focal airspace disease or consolidation in the lungs. No blunting of costophrenic angles. No pneumothorax. Unchanged appearance of right central venous catheter.  IMPRESSION: Mild cardiac enlargement.  No evidence of active pulmonary disease.   Electronically Signed   By: Lucienne Capers M.D.   On: 01/31/2015 01:24   Abd 1 View (kub)  02/11/2015   CLINICAL DATA:  Acute onset of generalized abdominal pain. Initial encounter.  EXAM: ABDOMEN - 1 VIEW  COMPARISON:  CT of the abdomen and pelvis from 11/05/2014, and abdominal radiograph performed 12/02/2014  FINDINGS:  The visualized bowel gas pattern is unremarkable. Scattered air and stool filled loops of colon are seen; no abnormal dilatation of small bowel loops is seen to suggest small bowel obstruction. No free intra-abdominal air is identified, though evaluation for free air is limited on a single supine view.  The visualized osseous structures are within normal limits; the sacroiliac joints are unremarkable in appearance. A dialysis catheter is noted overlying the pelvis. The visualized lung bases are essentially clear.  IMPRESSION: Unremarkable bowel gas pattern; no free intra-abdominal air seen. Moderate  amount of stool noted in the colon.   Electronically Signed   By: Garald Balding M.D.   On: 02/11/2015 04:14   Ct Angio Chest Pe W/cm &/or Wo Cm  01/31/2015   CLINICAL DATA:  Patient woke up this evening with central chest pain and shortness of breath.  EXAM: CT ANGIOGRAPHY CHEST WITH CONTRAST  TECHNIQUE: Multidetector CT imaging of the chest was performed using the standard protocol during bolus administration of intravenous contrast. Multiplanar CT image reconstructions and MIPs were obtained to evaluate the vascular anatomy.  CONTRAST:  135m OMNIPAQUE IOHEXOL 350 MG/ML SOLN  COMPARISON:  10/07/2014  FINDINGS: Technically adequate study with good opacification of the central and segmental pulmonary arteries. No focal filling defects are demonstrated. No evidence of significant pulmonary embolus.  Cardiac enlargement. Mild dilatation of the proximal descending thoracic aorta at 3.8 cm diameter. No change since prior study. Enlarged lymph nodes demonstrated throughout the mediastinum. Largest lymph node is in the pretracheal region and measures about 16 mm short axis dimension. Lymph nodes appear similar to prior study and may represent reactive change. Esophagus is decompressed.  Evaluation of lungs is limited due to respiratory motion artifact. No evidence of focal consolidation or airspace disease. Airways appear  patent. No pleural effusions. No pneumothorax.  Included portions of the upper abdominal organs demonstrate retrograde opacification of the hepatic veins suggesting right heart failure. No destructive bone lesions.  Review of the MIP images confirms the above findings.  IMPRESSION: Cardiac enlargement and contrast reflux into the hepatic veins suggesting right heart failure. No evidence of significant pulmonary embolus.   Electronically Signed   By: WLucienne CapersM.D.   On: 01/31/2015 05:02   UKoreaAbdomen Complete  02/11/2015   CLINICAL DATA:  Acute onset of midline abdominal pain. Initial encounter.  EXAM: ULTRASOUND ABDOMEN COMPLETE  COMPARISON:  CT of the abdomen and pelvis performed 11/05/2014  FINDINGS: Gallbladder: No gallstones or wall thickening visualized. No sonographic Murphy sign noted.  Common bile duct: Diameter: 0.4 cm, within normal limits in caliber.  Liver: No focal lesion identified. Within normal limits in parenchymal echogenicity.  IVC: No abnormality visualized.  Pancreas: Visualized portion unremarkable.  Spleen: Not characterized on this study.  Right Kidney: Length: 7.3 cm. Diffusely increased parenchymal echogenicity, with mild atrophy. A small 1.4 cm cyst is noted at the upper pole of the right kidney. No hydronephrosis visualized.  Left Kidney:  The native left kidney is not visualized.  Transplant Kidney: Length: 10.6 cm. Echogenicity within normal limits. No mass or hydronephrosis visualized.  Abdominal aorta: No aneurysm visualized.  Other findings: A small amount of ascites is noted at the left lower quadrant, with adjacent small bowel loops.  IMPRESSION: 1. No acute abnormality seen to explain the patient's symptoms. 2. Mild atrophy of the native right kidney, with findings of medical renal disease. Native left kidney not visualized. Transplant kidney is unremarkable in appearance. 3. Small amount of ascites at the left lower quadrant.   Electronically Signed   By: JGarald Balding M.D.   On: 02/11/2015 04:01   Dg Chest Port 1 View  01/26/2015   CLINICAL DATA:  Central chest pain  EXAM: PORTABLE CHEST - 1 VIEW  COMPARISON:  01/09/2015  FINDINGS: Dialysis catheter from right IJ approach is in stable position, tip at the right atrium. Stable cardiomegaly. Stable aortic and hilar contours.  Baseline appearance of the lungs. There is no edema, consolidation, effusion, or pneumothorax.  No acute osseous findings.  IMPRESSION:  Stable exam.  No acute cardiopulmonary disease.   Electronically Signed   By: Monte Fantasia M.D.   On: 01/26/2015 04:30     CBC  Recent Labs Lab 02/10/15 2123 02/11/15 0442 02/12/15 0606 02/13/15 0539 02/14/15 0904  WBC 9.6 10.8* 7.4 7.0  --   HGB 9.0* 8.8* 8.0* 8.4* 9.9*  HCT 27.8* 27.8* 24.8* 26.0* 29.0*  PLT 229 214 184 207  --   MCV 84.5 84.2 84.9 84.4  --   MCH 27.4 26.7 27.4 27.3  --   MCHC 32.4 31.7 32.3 32.3  --   RDW 18.9* 19.0* 19.0* 18.8*  --     Chemistries   Recent Labs Lab 02/10/15 2123 02/10/15 2136 02/11/15 0442 02/11/15 1833 02/12/15 0606 02/13/15 0539 02/14/15 0904  NA 130*  --  131* 133* 134* 130* 132*  K 6.6*  --  5.6* 4.9 4.8 4.7 5.5*  CL 97*  --  96* 97* 97* 95*  --   CO2 24  --  _0 --   GLUCOSE 86  --  73 111* 134* 104* 77  BUN 46*  --  51* 27* 35* 47*  --   CREATININE 10.75*  --  11.24* 6.77* 8.09* 10.08*  --   CALCIUM 7.9*  --  8.0* 7.3* 7.6* 7.3*  --   AST  --  61* 63*  --  52* 50*  --   ALT  --  17 20  --  15* 14*  --   ALKPHOS  --  98 95  --  85 94  --   BILITOT  --  0.6 0.8  --  0.5 0.4  --    ------------------------------------------------------------------------------------------------------------------ estimated creatinine clearance is 9.6 mL/min (by C-G formula based on Cr of 10.08). ------------------------------------------------------------------------------------------------------------------ No results for input(s): HGBA1C in the last 72  hours. ------------------------------------------------------------------------------------------------------------------ No results for input(s): CHOL, HDL, LDLCALC, TRIG, CHOLHDL, LDLDIRECT in the last 72 hours. ------------------------------------------------------------------------------------------------------------------ No results for input(s): TSH, T4TOTAL, T3FREE, THYROIDAB in the last 72 hours.  Invalid input(s): FREET3 ------------------------------------------------------------------------------------------------------------------ No results for input(s): VITAMINB12, FOLATE, FERRITIN, TIBC, IRON, RETICCTPCT in the last 72 hours.  Coagulation profile  Recent Labs Lab 02/11/15 0442 02/12/15 0606 02/12/15 1742 02/13/15 0539 02/14/15 0519  INR 1.61* 2.02* 1.52* 1.40 1.34    No results for input(s): DDIMER in the last 72 hours.  Cardiac Enzymes  Recent Labs Lab 02/11/15 0442 02/11/15 1833  TROPONINI 0.09* 0.07*   ------------------------------------------------------------------------------------------------------------------ Invalid input(s): POCBNP   CBG:  Recent Labs Lab 02/13/15 1633 02/13/15 1937 02/13/15 2358 02/14/15 0416 02/14/15 0816  GLUCAP 110* 111* 100* 88 82       Studies: No results found.    Lab Results  Component Value Date   HGBA1C 5.4 02/11/2015   HGBA1C 5.9* 01/31/2015   HGBA1C 5.70 10/26/2014   Lab Results  Component Value Date   CREATININE 10.08* 02/13/2015       Scheduled Meds: . [MAR Hold] sodium chloride   Intravenous Once  . [MAR Hold] atorvastatin  40 mg Oral q1800  . [MAR Hold] carvedilol  25 mg Oral BID WC  . [MAR Hold] cinacalcet  30 mg Oral Q breakfast  . [MAR Hold] docusate sodium  100 mg Oral BID  . [MAR Hold] hydrALAZINE  50 mg Oral 3 times per day  . [MAR Hold] insulin aspart  0-5 Units Subcutaneous QHS  . [MAR Hold] insulin aspart  0-9 Units Subcutaneous TID WC  . [MAR Hold] multivitamin  1 tablet  Oral QHS  . [MAR Hold] pantoprazole (PROTONIX) IV  40 mg Intravenous Q24H  . [MAR Hold] polyethylene glycol  17 g Oral BID  . [MAR Hold] predniSONE  5 mg Oral Q breakfast  . [MAR Hold] sevelamer carbonate  1,600 mg Oral TID WC  . [MAR Hold] sodium chloride  3 mL Intravenous Q12H  . [MAR Hold] sodium polystyrene  60 g Oral Once   Continuous Infusions: . sodium chloride 10 mL/hr at 02/14/15 0901    Principal Problem:   Hyperkalemia Active Problems:   ESRD on dialysis- Tues, Thurs, Sat in [facility], nephro: [name]   Chronic combined systolic and diastolic CHF (congestive heart failure)   DM (diabetes mellitus), type 2, uncontrolled, with renal complications   Anemia of chronic disease   Dyslipidemia   HTN (hypertension)   Left upper extremity deep vein thrombosis    Time spent: 45 minutes   [facility] Pager [phone]. If 7PM-7AM, please contact night-coverage at [email or web address], password [facility] 02/14/2015, 10:53 AM  LOS: 3 days

## 2015-02-14 NOTE — Progress Notes (Signed)
Utilization Review Completed.Donne Anon T7/26/2016

## 2015-02-15 ENCOUNTER — Encounter (HOSPITAL_COMMUNITY): Payer: Self-pay | Admitting: General Surgery

## 2015-02-15 DIAGNOSIS — K4030 Unilateral inguinal hernia, with obstruction, without gangrene, not specified as recurrent: Secondary | ICD-10-CM

## 2015-02-15 DIAGNOSIS — D638 Anemia in other chronic diseases classified elsewhere: Secondary | ICD-10-CM

## 2015-02-15 DIAGNOSIS — I10 Essential (primary) hypertension: Secondary | ICD-10-CM

## 2015-02-15 LAB — COMPREHENSIVE METABOLIC PANEL WITH GFR
ALT: 13 U/L — ABNORMAL LOW (ref 17–63)
AST: 64 U/L — ABNORMAL HIGH (ref 15–41)
Albumin: 2.2 g/dL — ABNORMAL LOW (ref 3.5–5.0)
Alkaline Phosphatase: 90 U/L (ref 38–126)
Anion gap: 12 (ref 5–15)
BUN: 25 mg/dL — ABNORMAL HIGH (ref 6–20)
CO2: 22 mmol/L (ref 22–32)
Calcium: 8.4 mg/dL — ABNORMAL LOW (ref 8.9–10.3)
Chloride: 98 mmol/L — ABNORMAL LOW (ref 101–111)
Creatinine, Ser: 7.55 mg/dL — ABNORMAL HIGH (ref 0.61–1.24)
GFR calc Af Amer: 9 mL/min — ABNORMAL LOW
GFR calc non Af Amer: 7 mL/min — ABNORMAL LOW
Glucose, Bld: 66 mg/dL (ref 65–99)
Potassium: 5.9 mmol/L — ABNORMAL HIGH (ref 3.5–5.1)
Sodium: 132 mmol/L — ABNORMAL LOW (ref 135–145)
Total Bilirubin: 0.9 mg/dL (ref 0.3–1.2)
Total Protein: 7 g/dL (ref 6.5–8.1)

## 2015-02-15 LAB — PROTIME-INR
INR: 1.31 (ref 0.00–1.49)
Prothrombin Time: 16.4 seconds — ABNORMAL HIGH (ref 11.6–15.2)

## 2015-02-15 LAB — GLUCOSE, CAPILLARY
GLUCOSE-CAPILLARY: 63 mg/dL — AB (ref 65–99)
GLUCOSE-CAPILLARY: 76 mg/dL (ref 65–99)
GLUCOSE-CAPILLARY: 96 mg/dL (ref 65–99)
Glucose-Capillary: 114 mg/dL — ABNORMAL HIGH (ref 65–99)
Glucose-Capillary: 67 mg/dL (ref 65–99)
Glucose-Capillary: 87 mg/dL (ref 65–99)
Glucose-Capillary: 88 mg/dL (ref 65–99)

## 2015-02-15 LAB — CBC
HCT: 29 % — ABNORMAL LOW (ref 39.0–52.0)
Hemoglobin: 9.5 g/dL — ABNORMAL LOW (ref 13.0–17.0)
MCH: 27.5 pg (ref 26.0–34.0)
MCHC: 32.8 g/dL (ref 30.0–36.0)
MCV: 83.8 fL (ref 78.0–100.0)
Platelets: 246 10*3/uL (ref 150–400)
RBC: 3.46 MIL/uL — ABNORMAL LOW (ref 4.22–5.81)
RDW: 18.4 % — ABNORMAL HIGH (ref 11.5–15.5)
WBC: 10 10*3/uL (ref 4.0–10.5)

## 2015-02-15 LAB — HEPARIN LEVEL (UNFRACTIONATED)
HEPARIN UNFRACTIONATED: 0.29 [IU]/mL — AB (ref 0.30–0.70)
Heparin Unfractionated: 0.17 IU/mL — ABNORMAL LOW (ref 0.30–0.70)

## 2015-02-15 LAB — POTASSIUM: Potassium: 5.7 mmol/L — ABNORMAL HIGH (ref 3.5–5.1)

## 2015-02-15 MED ORDER — CARVEDILOL 25 MG PO TABS
25.0000 mg | ORAL_TABLET | Freq: Two times a day (BID) | ORAL | Status: DC
  Administered 2015-02-15 – 2015-02-16 (×2): 25 mg via ORAL
  Filled 2015-02-15: qty 1

## 2015-02-15 MED ORDER — LISINOPRIL 20 MG PO TABS
20.0000 mg | ORAL_TABLET | Freq: Every day | ORAL | Status: DC
  Administered 2015-02-15 – 2015-02-16 (×2): 20 mg via ORAL
  Filled 2015-02-15 (×2): qty 1

## 2015-02-15 MED ORDER — OXYCODONE HCL 5 MG PO TABS
10.0000 mg | ORAL_TABLET | ORAL | Status: DC | PRN
  Administered 2015-02-15: 10 mg via ORAL
  Administered 2015-02-15 – 2015-02-16 (×2): 15 mg via ORAL
  Filled 2015-02-15 (×2): qty 3
  Filled 2015-02-15: qty 2

## 2015-02-15 MED ORDER — WARFARIN - PHARMACIST DOSING INPATIENT
Freq: Every day | Status: DC
  Administered 2015-02-16: 18:00:00

## 2015-02-15 MED ORDER — HEPARIN (PORCINE) IN NACL 100-0.45 UNIT/ML-% IJ SOLN
2650.0000 [IU]/h | INTRAMUSCULAR | Status: DC
  Administered 2015-02-15: 2550 [IU]/h via INTRAVENOUS
  Administered 2015-02-15 – 2015-02-16 (×2): 2650 [IU]/h via INTRAVENOUS
  Filled 2015-02-15 (×4): qty 250

## 2015-02-15 MED ORDER — WARFARIN SODIUM 6 MG PO TABS
6.0000 mg | ORAL_TABLET | Freq: Once | ORAL | Status: AC
  Administered 2015-02-15: 6 mg via ORAL
  Filled 2015-02-15: qty 1

## 2015-02-15 MED ORDER — HYDROMORPHONE HCL 1 MG/ML IJ SOLN
INTRAMUSCULAR | Status: AC
  Filled 2015-02-15: qty 1

## 2015-02-15 MED ORDER — AMLODIPINE BESYLATE 10 MG PO TABS
10.0000 mg | ORAL_TABLET | Freq: Every day | ORAL | Status: DC
  Administered 2015-02-15 – 2015-02-16 (×2): 10 mg via ORAL
  Filled 2015-02-15 (×2): qty 1

## 2015-02-15 MED ORDER — METHOCARBAMOL 500 MG PO TABS
1000.0000 mg | ORAL_TABLET | Freq: Three times a day (TID) | ORAL | Status: DC
  Administered 2015-02-15 – 2015-02-16 (×4): 1000 mg via ORAL
  Filled 2015-02-15 (×4): qty 2

## 2015-02-15 MED ORDER — KETOROLAC TROMETHAMINE 15 MG/ML IJ SOLN
15.0000 mg | Freq: Four times a day (QID) | INTRAMUSCULAR | Status: DC | PRN
  Administered 2015-02-15: 15 mg via INTRAVENOUS
  Filled 2015-02-15: qty 1

## 2015-02-15 NOTE — Progress Notes (Signed)
[place] for Heparin Indication: pulmonary embolus and DVT  Allergies  Allergen Reactions  . Ferrlecit [Na Ferric Gluc Cplx In Sucrose] Shortness Of Breath, Swelling and Other (See Comments)    Swelling in throat  . Darvocet [Propoxyphene N-Acetaminophen] Hives    Patient Measurements: Height: 6' 2" (188 cm) Weight: 175 lb 0.7 oz (79.4 kg) IBW/kg (Calculated) : 82.2 Heparin Dosing Weight: 75.9kg  Vital Signs: Temp: 98.1 F (36.7 C) (07/27 0459) Temp Source: Oral (07/27 0459) BP: 189/134 mmHg (07/27 0611) Pulse Rate: 92 (07/27 0611)  Labs:  Recent Labs  02/13/15 0539 02/13/15 1904 02/14/15 0519 02/14/15 0904 02/14/15 1954 02/15/15 0448  HGB 8.4*  --   --  9.9* 9.6* 9.5*  HCT 26.0*  --   --  29.0* 30.0* 29.0*  PLT 207  --   --   --  222 246  LABPROT 17.3*  --  16.7*  --   --  16.4*  INR 1.40  --  1.34  --   --  1.31  HEPARINUNFRC  --  0.26*  --   --   --  0.17*  CREATININE 10.08*  --   --   --  6.16*  --     Estimated Creatinine Clearance: 16.1 mL/min (by C-G formula based on Cr of 6.16).  Assessment: 49 yo male with Afib s/p hernia repair yesterday for heparin  Goal of Therapy:  Heparin level 0.3-0.7 units/ml Monitor platelets by anticoagulation protocol: Yes   Plan:  Increase Heparin 2550 units/hr Check heparin level in 8 hours.  Phillis Knack, PharmD, BCPS  02/15/2015 7:32 AM

## 2015-02-15 NOTE — Care Management Important Message (Signed)
Important Message  Patient Details  Name: Frank [name] MRN: [id number] Date of Birth: [birth year]   Medicare Important Message Given:  Yes-third notification given    Delorse Lek 02/15/2015, 12:46 PM

## 2015-02-15 NOTE — Progress Notes (Signed)
Patient ID: Frank [name], male   DOB: 1966/01/07, 49 y.o.   MRN: [id number] 1 Day Post-Op  Subjective: Pt c/o awful inguinal pain.  Asking how he is supposed to walk or move.  Tried oxyIR but had to go back to dilaudid.  Eating solid food for breakfast with no nausea  Objective: Vital signs in last 24 hours: Temp:  [97.7 F (36.5 C)-98.1 F (36.7 C)] 97.7 F (36.5 C) (07/27 0854) Pulse Rate:  [72-95] 94 (07/27 0854) Resp:  [10-20] 18 (07/27 0854) BP: (150-207)/(74-134) 181/120 mmHg (07/27 0854) SpO2:  [93 %-100 %] 97 % (07/27 0854) Weight:  [79.3 kg (174 lb 13.2 oz)-79.4 kg (175 lb 0.7 oz)] 79.4 kg (175 lb 0.7 oz) (07/26 2000) Last BM Date: 02/13/15  Intake/Output from previous day: 07/26 0701 - 07/27 0700 In: 360 [P.O.:360] Out: 1500  Intake/Output this shift: Total I/O In: 60 [P.O.:60] Out: 0   PE: Abd: soft, SP tube in place, right inguinal hernia repair incision is c/d/i with no evidence of hematoma or infection, tender, +BS  Lab Results:   Recent Labs  02/14/15 1954 02/15/15 0448  WBC 9.5 10.0  HGB 9.6* 9.5*  HCT 30.0* 29.0*  PLT 222 246   BMET  Recent Labs  02/14/15 1954 02/15/15 0448  NA 135 132*  K 4.4 5.9*  CL 99* 98*  CO2 25 22  GLUCOSE 73 66  BUN 20 25*  CREATININE 6.16* 7.55*  CALCIUM 8.0* 8.4*   PT/INR  Recent Labs  02/14/15 0519 02/15/15 0448  LABPROT 16.7* 16.4*  INR 1.34 1.31   CMP     Component Value Date/Time   NA 132* 02/15/2015 0448   K 5.9* 02/15/2015 0448   CL 98* 02/15/2015 0448   CO2 22 02/15/2015 0448   GLUCOSE 66 02/15/2015 0448   BUN 25* 02/15/2015 0448   CREATININE 7.55* 02/15/2015 0448   CALCIUM 8.4* 02/15/2015 0448   PROT 7.0 02/15/2015 0448   ALBUMIN 2.2* 02/15/2015 0448   AST 64* 02/15/2015 0448   ALT 13* 02/15/2015 0448   ALKPHOS 90 02/15/2015 0448   BILITOT 0.9 02/15/2015 0448   GFRNONAA 7* 02/15/2015 0448   GFRAA 9* 02/15/2015 0448   Lipase     Component Value Date/Time   LIPASE 17* 02/10/2015 2136        Studies/Results: No results found.  Anti-infectives: Anti-infectives    Start     Dose/Rate Route Frequency Ordered Stop   02/14/15 1011  polymyxin B 500,000 Units, bacitracin 50,000 Units in sodium chloride irrigation 0.9 % 500 mL irrigation  Status:  Discontinued       As needed 02/14/15 1011 02/14/15 1104   02/14/15 0915  [MAR Hold]  cefOXitin (MEFOXIN) 2 g in dextrose 5 % 50 mL IVPB     (MAR Hold since 02/14/15 0847)  Comments:  Pharmacy may adjust dosing strength, interval, or rate of medication as needed for optimal therapy for the patient Send with patient on call to the OR.  Anesthesia to complete antibiotic administration <46mn [facility]   2 g 100 mL/hr over 30 Minutes [facility] 02/14/15 0709 02/14/15 0940       Assessment/Plan  POD 1, s/p right inguinal hernia repair with mesh -a lot of pain today, increase oxy to 10-15 prn q4, add robaxin for muscle spasm pain control, and added prn toradol since he is ESRD -patient needs to try to get pain under control so he can  mobilize -ok to resume coumadin from our standpoint -reorder PT since he is post op to make sure they still follow.  LOS: 4 days    Babetta Paterson E 02/15/2015, 9:28 AM Pager: [phone]

## 2015-02-15 NOTE — Progress Notes (Signed)
[facility] for Heparin Indication: pulmonary embolus and DVT  Allergies  Allergen Reactions  . Ferrlecit [Na Ferric Gluc Cplx In Sucrose] Shortness Of Breath, Swelling and Other (See Comments)    Swelling in throat  . Darvocet [Propoxyphene N-Acetaminophen] Hives    Patient Measurements: Height: _0  (188 cm) Weight: 172 lb 13.5 oz (78.4 kg) IBW/kg (Calculated) : 82.2 Heparin Dosing Weight: 75.9kg  Vital Signs: Temp: 97.8 F (36.6 C) (07/27 1530) Temp Source: Oral (07/27 1530) BP: 164/103 mmHg (07/27 1700) Pulse Rate: 90 (07/27 1700)  Labs:  Recent Labs  02/13/15 0539 02/13/15 1904 02/14/15 0519 02/14/15 0904 02/14/15 1954 02/15/15 0448 02/15/15 1600  HGB 8.4*  --   --  9.9* 9.6* 9.5*  --   HCT 26.0*  --   --  29.0* 30.0* 29.0*  --   PLT 207  --   --   --  222 246  --   LABPROT 17.3*  --  16.7*  --   --  16.4*  --   INR 1.40  --  1.34  --   --  1.31  --   HEPARINUNFRC  --  0.26*  --   --   --  0.17* 0.29*  CREATININE 10.08*  --   --   --  6.16* 7.55*  --     Estimated Creatinine Clearance: 13 mL/min (by C-G formula based on Cr of 7.55).  Assessment: 49 yo male with Afib s/p hernia repair yesterday. Restarted heparin last night and ok to restart warfarin today per surgery for recent LUE DVT & chronic PE + R-IJ clot. Will d/c heparin when INR therapeutic x2.    Heparin level this afternoon increased, but still slightly low at 0.29 units/mL.    Goal of Therapy:  Heparin level 0.3-0.7 units/ml Monitor platelets by anticoagulation protocol: Yes   Plan:  -increase Heparin to 2650units/hr - d/c when INR therapeutic x2 -Daily HL/CBC/INR -Mon s/sx bleeding   Franchon Ketterman D. Miria Cappelli, PharmD, BCPS Clinical Pharmacist Pager: [phone] 02/15/2015 5:32 PM

## 2015-02-15 NOTE — Progress Notes (Signed)
ANTICOAGULATION CONSULT NOTE  Pharmacy Consult for Heparin/warfarin Indication: pulmonary embolus and DVT  Allergies  Allergen Reactions  . Ferrlecit [Na Ferric Gluc Cplx In Sucrose] Shortness Of Breath, Swelling and Other (See Comments)    Swelling in throat  . Darvocet [Propoxyphene N-Acetaminophen] Hives    Patient Measurements: Height: _0  (188 cm) Weight: 175 lb 0.7 oz (79.4 kg) IBW/kg (Calculated) : 82.2 Heparin Dosing Weight: 75.9kg  Vital Signs: Temp: 97.7 F (36.5 C) (07/27 0854) Temp Source: Oral (07/27 0854) BP: 186/120 mmHg (07/27 0930) Pulse Rate: 95 (07/27 1021)  Labs:  Recent Labs  02/13/15 0539 02/13/15 1904 02/14/15 0519 02/14/15 0904 02/14/15 1954 02/15/15 0448  HGB 8.4*  --   --  9.9* 9.6* 9.5*  HCT 26.0*  --   --  29.0* 30.0* 29.0*  PLT 207  --   --   --  222 246  LABPROT 17.3*  --  16.7*  --   --  16.4*  INR 1.40  --  1.34  --   --  1.31  HEPARINUNFRC  --  0.26*  --   --   --  0.17*  CREATININE 10.08*  --   --   --  6.16* 7.55*    Estimated Creatinine Clearance: 13.1 mL/min (by C-G formula based on Cr of 7.55).  Assessment: 49 yo male with Afib s/p hernia repair yesterday. Restarted heparin last night and ok to restart warfarin today per surgery for recent LUE DVT & chronic PE + R-IJ clot. Will d/c heparin when INR therapeutic x2. On warfarin pta. Heparin increased this AM with subtherapeutic level. INR 1.31 stable this AM (previously therapeutic prior to vitamin K for OR). Hg 9.5 (increased), plt stable wnl. No bleeding issues.  PTA warfarin dose: 32m daily  Goal of Therapy:  Heparin level 0.3-0.7 units/ml Monitor platelets by anticoagulation protocol: Yes   Plan:  Continue Heparin 2550 units/hr - d/c when INR therapeutic x2 Warfarin 635mx 1 dose tonight 8h HL Daily HL/CBC/INR Mon s/sx bleeding  HaElicia LampPharmD Clinical Pharmacist Pager [phone]/27/2016 10:27 AM

## 2015-02-15 NOTE — Progress Notes (Signed)
[facility] Progress Note  Assessment/Plan: Hyperkalemia: Revolved. 2. S/P Incarcerated right inguinal hernia repair : per Dr. Hulen Skains. Steri Strips intact. A & O after procedure.  2. ESRD - TTS at [facility].  3. Anemia - HGB 9.4. Today Follow CBCs.  4. Secondary hyperparathyroidism -  5. HTN/volume - Had HD 02/14/15 Net UF 1500 post Wt. 79.4 Actually came out 1/10 kg higher!  Still Hypertensive. Will UF next [employer] for 3-4 liters. Set new EDW. Accurate wts.  K+5.9. Slight hemolysis. Will redraw stat. If still high may need HD today.  6. Nutrition -renal diet  7. Abdominal Pain: Chronic: Per primary 7. UE DVT on heparin gtt and warfarin; Per primary and on warfarin  Rita H. Brown NP-C 02/15/2015, 8:05 AM  [facility] [phone]  Pt seen, examined, agree w assess/plan as above with additions as indicated. Patient with high K today, plan short HD today and again tomorrow to get K and vol down.  BP still very high, pain control may be affecting this. Also is not getting lisinopril as at home, have added.  Kelly Splinter MD pager [phone]    cell [phone] 02/15/2015, 12:47 PM     Subjective:  "It (surgical incision) burns when I walk". No specific complaints. Asking if there is anything to make pain in incision area go away. Encouraged to get OOB and ambulate as tolerated.   Objective Filed Vitals:   02/14/15 1830 02/14/15 2000 02/15/15 0459 02/15/15 0611  BP: 207/123 183/116 194/134 189/134  Pulse: 91 94 95 92  Temp: 98.1 F (36.7 C) 98.1 F (36.7 C) 98.1 F (36.7 C)   TempSrc: Oral Oral Oral   Resp: _0 Height:      Weight:  79.4 kg (175 lb 0.7 oz)    SpO2: 100% 93% 99%    Physical Exam General: Well nourished NAD Heart: S1, S2 RRR  Lungs: Bilateral breath sounds CTA Abdomen: Soft non-tender. Active BS. Incision C/D/I Extremities: No edema. Pulses intact. Still has swelling R FA.  Dialysis Access: R. Tunneled perm cath    Dialysis Orders: TTS HD at [facility] 76 kg dry wt Additional Objective Labs: Basic Metabolic Panel:  Recent Labs Lab 02/11/15 1833  02/13/15 0539 02/14/15 0904 02/14/15 1954 02/15/15 0448  NA 133*  < > 130* 132* 135 132*  K 4.9  < > 4.7 5.5* 4.4 5.9*  CL 97*  < > 95*  --  99* 98*  CO2 28  < > 26  --  25 22  GLUCOSE 111*  < > 104* 77 73 66  BUN 27*  < > 47*  --  20 25*  CREATININE 6.77*  < > 10.08*  --  6.16* 7.55*  CALCIUM 7.3*  < > 7.3*  --  8.0* 8.4*  PHOS 4.1  --   --   --  3.4  --   < > = values in this interval not displayed. Liver Function Tests:  Recent Labs Lab 02/12/15 0606 02/13/15 0539 02/14/15 1954 02/15/15 0448  AST 52* 50*  --  64*  ALT 15* 14*  --  13*  ALKPHOS 85 94  --  90  BILITOT 0.5 0.4  --  0.9  PROT 5.9* 6.2*  --  7.0  ALBUMIN 1.8* 1.9* 2.1* 2.2*    Recent Labs Lab 02/10/15 2136  LIPASE 17*   CBC:  Recent Labs Lab 02/11/15 0442 02/12/15 0606 02/13/15 0539 02/14/15 0904 02/14/15 1954 02/15/15  0448  WBC 10.8* 7.4 7.0  --  9.5 10.0  HGB 8.8* 8.0* 8.4* 9.9* 9.6* 9.5*  HCT 27.8* 24.8* 26.0* 29.0* 30.0* 29.0*  MCV 84.2 84.9 84.4  --  84.5 83.8  PLT 214 184 207  --  222 246   Blood Culture    Component Value Date/Time   SDES BLOOD LEFT HAND 10/07/2014 0725   SPECREQUEST BOTTLES DRAWN AEROBIC AND ANAEROBIC 5CCS 10/07/2014 0725   CULT  10/07/2014 0725    NO GROWTH 5 DAYS Performed at [place] 10/13/2014 FINAL 10/07/2014 0725    Cardiac Enzymes:  Recent Labs Lab 02/11/15 0442 02/11/15 1833  TROPONINI 0.09* 0.07*   CBG:  Recent Labs Lab 02/14/15 1858 02/14/15 1954 02/15/15 02/15/15 0048 02/15/15 0139  GLUCAP 57* 70 67 63* 87   Iron Studies: No results for input(s): IRON, TIBC, TRANSFERRIN, FERRITIN in the last 72 hours. _0 @ Studies/Results: No results found. Medications: . sodium chloride 10 mL/hr at 02/14/15 0901  . heparin     . sodium chloride   Intravenous  Once  . atorvastatin  40 mg Oral q1800  . carvedilol  25 mg Oral BID WC  . cinacalcet  30 mg Oral Q breakfast  . docusate sodium  100 mg Oral BID  . hydrALAZINE  50 mg Oral 3 times per day  . insulin aspart  0-5 Units Subcutaneous QHS  . insulin aspart  0-9 Units Subcutaneous TID WC  . multivitamin  1 tablet Oral QHS  . pantoprazole (PROTONIX) IV  40 mg Intravenous Q24H  . polyethylene glycol  17 g Oral BID  . predniSONE  5 mg Oral Q breakfast  . sevelamer carbonate  1,600 mg Oral TID WC  . sodium chloride  3 mL Intravenous Q12H  . sodium polystyrene  60 g Oral Once

## 2015-02-15 NOTE — Discharge Instructions (Addendum)
[facility] _______[facility]  UMBILICAL OR INGUINAL HERNIA REPAIR: POST OP INSTRUCTIONS  Always review your discharge instruction sheet given to you by the facility where your surgery was performed. IF YOU HAVE DISABILITY OR FAMILY LEAVE FORMS, YOU MUST BRING THEM TO THE OFFICE FOR PROCESSING.   DO NOT GIVE THEM TO YOUR DOCTOR.  1. A  prescription for pain medication may be given to you upon discharge.  Take your pain medication as prescribed, if needed.  If narcotic pain medicine is not needed, then you may take acetaminophen (Tylenol) or ibuprofen (Advil) as needed. 2. Take your usually prescribed medications unless otherwise directed. 3. If you need a refill on your pain medication, please contact your pharmacy.  They will contact our office to request authorization. Prescriptions will not be filled after 5 pm or on week-ends. 4. You should follow a light diet the first 24 hours after arrival home, such as soup and crackers, etc.  Be sure to include lots of fluids daily.  Resume your normal diet the day after surgery. 5. Most patients will experience some swelling and bruising around the umbilicus or in the groin and scrotum.  Ice packs and reclining will help.  Swelling and bruising can take several days to resolve.  6. It is common to experience some constipation if taking pain medication after surgery.  Increasing fluid intake and taking a stool softener (such as Colace) will usually help or prevent this problem from occurring.  A mild laxative (Milk of Magnesia or Miralax) should be taken according to package directions if there are no bowel movements after 48 hours. 7. Unless discharge instructions indicate otherwise, you may remove your bandages 24-48 hours after surgery, and you may shower at that time.  You may have steri-strips (small skin tapes) in place directly over the incision.  These strips should be left on the skin for 7-10 days.  If your surgeon used skin glue on the  incision, you may shower in 24 hours.  The glue will flake off over the next 2-3 weeks.  Any sutures or staples will be removed at the office during your follow-up visit. 8. ACTIVITIES:  You may resume regular (light) daily activities beginning the next day--such as daily self-care, walking, climbing stairs--gradually increasing activities as tolerated.  You may have sexual intercourse when it is comfortable.  Refrain from any heavy lifting or straining until approved by your doctor. a. You may drive when you are no longer taking prescription pain medication, you can comfortably wear a seatbelt, and you can safely maneuver your car and apply brakes. b. RETURN TO WORK:  __________________________________________________________ 9. You should see your doctor in the office for a follow-up appointment approximately 2-3 weeks after your surgery.  Make sure that you call for this appointment within a day or two after you arrive home to insure a convenient appointment time. 10. OTHER INSTRUCTIONS:  __________________________________________________________________________________________________________________________________________________________________________________________  WHEN TO CALL YOUR DOCTOR: 1. Fever over 101.0 2. Inability to urinate 3. Nausea and/or vomiting 4. Extreme swelling or bruising 5. Continued bleeding from incision. 6. Increased pain, redness, or drainage from the incision  The clinic staff is available to answer your questions during regular business hours.  Please dont hesitate to call and ask to speak to one of the nurses for clinical concerns.  If you have a medical emergency, go to the nearest emergency room or call 911.  A surgeon from [facility] is always on call at the hospital  [address] [place], [place], [place]  [address] ?  [address], [place], [place]   [phone] ? [phone] ? FAX [phone] Web site:  [email or web address]   Information on my medicine - Coumadin   (Warfarin)  This medication education was reviewed with me or my healthcare representative as part of my discharge preparation.  The pharmacist that spoke with me during my hospital stay was:  Romona Curls, [facility]  Why was Coumadin prescribed for you? Coumadin was prescribed for you because you have a blood clot or a medical condition that can cause an increased risk of forming blood clots. Blood clots can cause serious health problems by blocking the flow of blood to the heart, lung, or brain. Coumadin can prevent harmful blood clots from forming. As a reminder your indication for Coumadin is:   Select from menu  What test will check on my response to Coumadin? While on Coumadin (warfarin) you will need to have an INR test regularly to ensure that your dose is keeping you in the desired range. The INR (international normalized ratio) number is calculated from the result of the laboratory test called prothrombin time (PT).  If an INR APPOINTMENT HAS NOT ALREADY BEEN MADE FOR YOU please schedule an appointment to have this lab work done by your health care provider within 7 days. Your INR goal is usually a number between:  2 to 3 or your provider may give you a more narrow range like 2-2.5.  Ask your health care provider during an office visit what your goal INR is.  What  do you need to  know  About  COUMADIN? Take Coumadin (warfarin) exactly as prescribed by your healthcare provider about the same time each day.  DO NOT stop taking without talking to the doctor who prescribed the medication.  Stopping without other blood clot prevention medication to take the place of Coumadin may increase your risk of developing a new clot or stroke.  Get refills before you run out.  What do you do if you miss a dose? If you miss a dose, take it as soon as you remember on the same day then continue your regularly scheduled regimen the  next day.  Do not take two doses of Coumadin at the same time.  Important Safety Information A possible side effect of Coumadin (Warfarin) is an increased risk of bleeding. You should call your healthcare provider right away if you experience any of the following: ? Bleeding from an injury or your nose that does not stop. ? Unusual colored urine (red or dark brown) or unusual colored stools (red or black). ? Unusual bruising for unknown reasons. ? A serious fall or if you hit your head (even if there is no bleeding).  Some foods or medicines interact with Coumadin (warfarin) and might alter your response to warfarin. To help avoid this: ? Eat a balanced diet, maintaining a consistent amount of Vitamin K. ? Notify your provider about major diet changes you plan to make. ? Avoid alcohol or limit your intake to 1 drink for women and 2 drinks for men per day. (1 drink is 5 oz. wine, 12 oz. beer, or 1.5 oz. liquor.)  Make sure that ANY health care provider who prescribes medication for you knows that you are taking Coumadin (warfarin).  Also make sure the healthcare provider who is monitoring your Coumadin knows when you have started a new medication including herbals and non-prescription products.  Coumadin (  Warfarin)  Major Drug Interactions  Increased Warfarin Effect Decreased Warfarin Effect  Alcohol (large quantities) Antibiotics (esp. Septra/Bactrim, Flagyl, Cipro) Amiodarone (Cordarone) Aspirin (ASA) Cimetidine (Tagamet) Megestrol (Megace) NSAIDs (ibuprofen, naproxen, etc.) Piroxicam (Feldene) Propafenone (Rythmol SR) Propranolol (Inderal) Isoniazid (INH) Posaconazole (Noxafil) Barbiturates (Phenobarbital) Carbamazepine (Tegretol) Chlordiazepoxide (Librium) Cholestyramine (Questran) Griseofulvin Oral Contraceptives Rifampin Sucralfate (Carafate) Vitamin K   Coumadin (Warfarin) Major Herbal Interactions  Increased Warfarin Effect Decreased Warfarin Effect   Garlic Ginseng Ginkgo biloba Coenzyme Q10 Green tea St. Johns wort    Coumadin (Warfarin) FOOD Interactions  Eat a consistent number of servings per week of foods HIGH in Vitamin K (1 serving =  cup)  Collards (cooked, or boiled & drained) Kale (cooked, or boiled & drained) Mustard greens (cooked, or boiled & drained) Parsley *serving size only =  cup Spinach (cooked, or boiled & drained) Swiss chard (cooked, or boiled & drained) Turnip greens (cooked, or boiled & drained)  Eat a consistent number of servings per week of foods MEDIUM-HIGH in Vitamin K (1 serving = 1 cup)  Asparagus (cooked, or boiled & drained) Broccoli (cooked, boiled & drained, or raw & chopped) Brussel sprouts (cooked, or boiled & drained) *serving size only =  cup Lettuce, raw (green leaf, endive, romaine) Spinach, raw Turnip greens, raw & chopped   These websites have more information on Coumadin (warfarin):  FailFactory.se; VeganReport.com.au;

## 2015-02-15 NOTE — Evaluation (Signed)
Physical Therapy Evaluation Patient Details Name: Frank [name] MRN: [id number] DOB: 12/17/1965 Today's Date: 02/15/2015   History of Present Illness  Frank [name] is a 49 y.o. male with Past medical history of ESRD on peritoneal dialysis with when necessary hemodialysis, diabetes mellitus, anasarca, hypertension, chronic pain syndrome, chronic noncompliance with medical regimen. Presents with chest pain, epigastric region radiating to arm. S/p right inguinal hernia repair on 02/14/15.  Clinical Impression  Frank [name] is eager to improve but extremely hesitant to move due to high pain anticipation, and needs constant reassurance for new movements. He was able to ambulate with supervision using a walker, cane, and no AD but states that he cannot go home walking like he is. Depending on pain progress, patient will benefit from continued PT to address movement hesitation, to practice transfer techniques to minimize pain and improve, and improve ambulation technique and endurance.    Follow Up Recommendations No PT follow up    Equipment Recommendations  Cane (single point cane)    Recommendations for Other Services       Precautions / Restrictions Precautions Precautions: None Restrictions Weight Bearing Restrictions: No      Mobility  Bed Mobility Overal bed mobility: +2 for physical assistance;Modified Independent (+2 min assist initially due to pain, worked up to [place])             General bed mobility comments: hesitant and unsure how to perform but capable  Transfers Overall transfer level: Needs assistance Equipment used: 2 person hand held assist (for initial transfer, subsequent transfers independently) Transfers: Sit to/from Stand Sit to Stand: +2 physical assistance;Mod assist;Modified independent (Device/Increase time) (+2 mod assist intially due to pain, worked up to [place])         General transfer comment: required use of bed rails and/or pushing up from  the bed for sit to stand  Ambulation/Gait Ambulation/Gait assistance: Min assist;+2 safety/equipment;Modified independent (Device/Increase time) (+2 min assist intially, worked up to [place], +2 for equip) Ambulation Distance (Feet): 220 Feet Assistive device: Rolling walker (2 wheeled);Straight cane (was able to ambulate short distances with no AD) Gait Pattern/deviations: Decreased step length - left;Decreased stance time - right;Decreased weight shift to right;Step-to pattern (toe out on right)   Gait velocity interpretation: <1.8 ft/sec, indicative of risk for recurrent falls General Gait Details: slow but relatively steady, limited by pain and needed frequent rest breaks  Stairs            Wheelchair Mobility    Modified Rankin (Stroke Patients Only)       Balance Overall balance assessment: Needs assistance   Sitting balance-Leahy Scale: Good       Standing balance-Leahy Scale: Fair                               Pertinent Vitals/Pain Pain Assessment: 0-10 Pain Score: 6  Pain Location: right anterior hip Pain Descriptors / Indicators: Burning Pain Intervention(s): Limited activity within patient's tolerance;Monitored during session;Premedicated before session;RN gave pain meds during session;Relaxation    Home Living Family/patient expects to be discharged to:: Private residence Living Arrangements: Alone   Type of Home: House Home Access: Level entry     Home Layout: One level Home Equipment: None      Prior Function Level of Independence: Independent               Hand Dominance   Dominant Hand: Right  Extremity/Trunk Assessment               Lower Extremity Assessment: RLE deficits/detail RLE Deficits / Details: decreased knee and hip flexion AROM due to pain, decreased WBing during gait, difficulty lifting leg against gravity but was able to propel leg forward during gait.       Communication      Cognition  Arousal/Alertness: Awake/alert Behavior During Therapy: WFL for tasks assessed/performed Overall Cognitive Status: Within Functional Limits for tasks assessed                      General Comments      Exercises General Exercises - Lower Extremity Ankle Circles/Pumps: Right;AROM;10 reps;Supine Quad Sets: Strengthening;Right;10 reps;Supine Heel Slides: AROM;Right;10 reps;Supine Hip ABduction/ADduction: AROM;Right;10 reps;Supine      Assessment/Plan    PT Assessment Patient needs continued PT services  PT Diagnosis Difficulty walking;Acute pain;Abnormality of gait;Generalized weakness   PT Problem List Decreased strength;Decreased range of motion;Decreased activity tolerance;Decreased mobility;Decreased knowledge of use of DME;Pain;Decreased balance  PT Treatment Interventions DME instruction;Gait training;Stair training;Functional mobility training;Therapeutic activities;Therapeutic exercise;Balance training;Modalities   PT Goals (Current goals can be found in the Care Plan section) Acute Rehab PT Goals Patient Stated Goal: return to work PT Goal Formulation: With patient Time For Goal Achievement: 03/01/15 Potential to Achieve Goals: Good    Frequency Min 4X/week   Barriers to discharge Decreased caregiver support lives alone so needs to be independent    Co-evaluation               End of Session   Activity Tolerance: Patient tolerated treatment well;Patient limited by fatigue;Patient limited by pain Patient left: in bed;with call bell/phone within reach;with bed alarm set;with SCD's reapplied Nurse Communication: Mobility status;Weight bearing status         Time: 9767-3419 PT Time Calculation (min) (ACUTE ONLY): 84 min   Charges:   PT Evaluation $Initial PT Evaluation Tier I: 1 Procedure PT Treatments $Gait Training: 23-37 mins $Therapeutic Activity: 23-37 mins $Self Care/Home Management: 03-24-23   PT G CodesRoanna Epley,  SPT [phone]  02/15/2015, 2:06 PM

## 2015-02-15 NOTE — Progress Notes (Signed)
[facility] PROGRESS NOTE  Frank [name] BWG:[id number] DOB: 1966/03/15 DOA: 02/10/2015 PCP: Angelica Chessman, MD  Assessment/Plan: #1 scrotal pain/suspected incarcerated right inguinal hernia with communicating hydrocele Status post repair of incarcerated right inguinal hernia. Per general surgery.  #2 end-stage renal disease on hemodialysis Tuesday Thursday and Saturday at [facility] Patient has been seen by nephrology patient currently in hemodialysis. Per renal.  #3 hyperkalemia Patient still hyperkalemic. Patient in hemodialysis. Lisinopril was initially held but resumed by nephrology. The patient is still hyperkalemic tomorrow may consider discontinuing lisinopril.  #4 hypertension Patient was significantly elevated blood pressure this morning. Patient is to continue on his Coreg and hydralazine. We'll resume patient's Norvasc. Patient's lisinopril has been resumed per nephrology. Follow.  #5 left upper extremity DVT/right IJ thrombus Currently on heparin drip. Coumadin to be resumed today. Continue heparin drip until INR is therapeutic prior to discharge.  #6 abdominal pain/suspected incarcerated inguinal hernia Initially thought to be secondary to constipation. Abdominal pain resolved however patient developed scrotal pain due to incarcerated inguinal hernia. Patient status post repair. See problem #1.  #7 elevated troponin Likely demand ischemia. Troponin has plateaued. Patient is asymptomatic. Follow.  #8 chronic combined systolic and diastolic CHF Patient on hemodialysis. Per nephrology.  #9 dysuria Maybe secondary to problem #1. Will check a UA with cultures and sensitivities to rule out UTI.  #10 anemia of chronic disease Stable. Transfusion during hemodialysis as needed.  #11 prophylaxis Heparin for DVT prophylaxis.   Code Status: Full Family Communication: Updated patient. No family at bedside. Disposition Plan: Remain  inpatient.   Consultants:  General surgery Dr. Donne Hazel 02/12/2015  Nephrology Dr. Joelyn Oms 02/11/2015  Procedures:  Repair of incarcerated right inguinal hernia per Dr. Hulen Skains 02/14/2015  CT abdomen and pelvis 02/12/2015  Abdominal films 02/11/2015  Chest x-ray 02/10/2015  Abdominal ultrasound 02/11/2015  Antibiotics:  None  HPI/Subjective: Patient in hemodialysis. Patient complaining of some dysuria. Patient denies any chest pain. No shortness of breath.  Objective: Filed Vitals:   02/15/15 1700  BP: 164/103  Pulse: 90  Temp:   Resp:     Intake/Output Summary (Last 24 hours) at 02/15/15 1743 Last data filed at 02/15/15 1535  Gross per 24 hour  Intake    540 ml  Output   1500 ml  Net   -960 ml   Filed Weights   02/14/15 1330 02/14/15 2000 02/15/15 1530  Weight: 79.3 kg (174 lb 13.2 oz) 79.4 kg (175 lb 0.7 oz) 78.4 kg (172 lb 13.5 oz)    Exam:   General:  NAD. IN HD  Cardiovascular: RRR  Respiratory: CTAB  anterior lung fields   Abdomen: Soft, nondistended, positive bowel sounds, incision site in the right lower quadrant with Steri-Strips site clean dry intact.   Musculoskeletal:  no clubbing cyanosis or edema.   Data Reviewed: Basic Metabolic Panel:  Recent Labs Lab 02/11/15 1833 02/12/15 0606 02/13/15 0539 02/14/15 0904 02/14/15 1954 02/15/15 0448 02/15/15 1120  NA 133* 134* 130* 132* 135 132*  --   K 4.9 4.8 4.7 5.5* 4.4 5.9* 5.7*  CL 97* 97* 95*  --  99* 98*  --   CO2 _0 --  25 22  --   GLUCOSE 111* 134* 104* 77 73 66  --   BUN 27* 35* 47*  --  20 25*  --   CREATININE 6.77* 8.09* 10.08*  --  6.16* 7.55*  --   CALCIUM 7.3* 7.6* 7.3*  --  8.0* 8.4*  --  PHOS 4.1  --   --   --  3.4  --   --    Liver Function Tests:  Recent Labs Lab 02/10/15 2136 02/11/15 0442 02/11/15 1833 02/12/15 0606 02/13/15 0539 02/14/15 1954 02/15/15 0448  AST 61* 63*  --  52* 50*  --  64*  ALT 17 20  --  15* 14*  --  13*  ALKPHOS 98 95  --   85 94  --  90  BILITOT 0.6 0.8  --  0.5 0.4  --  0.9  PROT 6.4* 6.1*  --  5.9* 6.2*  --  7.0  ALBUMIN 2.2* 2.1* 1.9* 1.8* 1.9* 2.1* 2.2*    Recent Labs Lab 02/10/15 2136  LIPASE 17*   No results for input(s): AMMONIA in the last 168 hours. CBC:  Recent Labs Lab 02/11/15 0442 02/12/15 0606 02/13/15 0539 02/14/15 0904 02/14/15 1954 02/15/15 0448  WBC 10.8* 7.4 7.0  --  9.5 10.0  HGB 8.8* 8.0* 8.4* 9.9* 9.6* 9.5*  HCT 27.8* 24.8* 26.0* 29.0* 30.0* 29.0*  MCV 84.2 84.9 84.4  --  84.5 83.8  PLT 214 184 207  --  222 246   Cardiac Enzymes:  Recent Labs Lab 02/11/15 0442 02/11/15 1833  TROPONINI 0.09* 0.07*   BNP (last 3 results)  Recent Labs  11/26/14 2352 01/09/15 1529 01/26/15 0421  BNP >4500.0* 3606.1* >4500.0*    ProBNP (last 3 results)  Recent Labs  04/15/14 0809  PROBNP >70000.0*    CBG:  Recent Labs Lab 02/15/15 02/15/15 0048 02/15/15 0139 02/15/15 0748 02/15/15 1114  GLUCAP 67 63* 87 76 88    Recent Results (from the past 240 hour(s))  Surgical pcr screen     Status: None   Collection Time: 02/12/15 11:03 PM  Result Value Ref Range Status   MRSA, PCR NEGATIVE NEGATIVE Final   Staphylococcus aureus NEGATIVE NEGATIVE Final    Comment:        The Xpert SA Assay (FDA approved for NASAL specimens in patients over 42 years of age), is one component of a comprehensive surveillance program.  Test performance has been validated by [facility] for patients greater than or equal to 54 year old. It is not intended to diagnose infection nor to guide or monitor treatment.      Studies: No results found.  Scheduled Meds: . sodium chloride   Intravenous Once  . amLODipine  10 mg Oral Daily  . atorvastatin  40 mg Oral q1800  . carvedilol  25 mg Oral BID WC  . cinacalcet  30 mg Oral Q breakfast  . docusate sodium  100 mg Oral BID  . hydrALAZINE  50 mg Oral 3 times per day  . insulin aspart  0-5 Units Subcutaneous QHS  . insulin aspart   0-9 Units Subcutaneous TID WC  . lisinopril  20 mg Oral Daily  . methocarbamol  1,000 mg Oral TID  . multivitamin  1 tablet Oral QHS  . pantoprazole (PROTONIX) IV  40 mg Intravenous Q24H  . polyethylene glycol  17 g Oral BID  . predniSONE  5 mg Oral Q breakfast  . sevelamer carbonate  1,600 mg Oral TID WC  . sodium chloride  3 mL Intravenous Q12H  . sodium polystyrene  60 g Oral Once  . warfarin  6 mg Oral ONCE-1800  . Warfarin - Pharmacist Dosing Inpatient   Does not apply q1800   Continuous Infusions: . sodium chloride 10 mL/hr at 02/14/15 0901  .  heparin 2,550 Units/hr (02/15/15 1222)    Principal Problem:   Hyperkalemia Active Problems:   ESRD on dialysis- Tues, Thurs, Sat in [employer], nephro: [name]   Chronic combined systolic and diastolic CHF (congestive heart failure)   DM (diabetes mellitus), type 2, uncontrolled, with renal complications   Anemia of chronic disease   Dyslipidemia   HTN (hypertension)   Left upper extremity deep vein thrombosis    Time spent: 35 minutes    [facility] MD [facility] Pager [phone]. If 7PM-7AM, please contact night-coverage at [email or web address], password [facility] 02/15/2015, 5:43 PM  LOS: 4 days

## 2015-02-16 DIAGNOSIS — E1129 Type 2 diabetes mellitus with other diabetic kidney complication: Secondary | ICD-10-CM

## 2015-02-16 DIAGNOSIS — K4030 Unilateral inguinal hernia, with obstruction, without gangrene, not specified as recurrent: Secondary | ICD-10-CM | POA: Diagnosis present

## 2015-02-16 DIAGNOSIS — E785 Hyperlipidemia, unspecified: Secondary | ICD-10-CM

## 2015-02-16 LAB — RENAL FUNCTION PANEL
ANION GAP: 7 (ref 5–15)
Albumin: 1.8 g/dL — ABNORMAL LOW (ref 3.5–5.0)
BUN: 21 mg/dL — AB (ref 6–20)
CALCIUM: 7.9 mg/dL — AB (ref 8.9–10.3)
CO2: 28 mmol/L (ref 22–32)
CREATININE: 7.04 mg/dL — AB (ref 0.61–1.24)
Chloride: 102 mmol/L (ref 101–111)
GFR calc Af Amer: 9 mL/min — ABNORMAL LOW (ref >60)
GFR, EST NON AFRICAN AMERICAN: 8 mL/min — AB (ref >60)
Glucose, Bld: 95 mg/dL (ref 65–99)
PHOSPHORUS: 5 mg/dL — AB (ref 2.5–4.6)
POTASSIUM: 4.7 mmol/L (ref 3.5–5.1)
Sodium: 137 mmol/L (ref 135–145)

## 2015-02-16 LAB — PROTIME-INR
INR: 1.37 (ref 0.00–1.49)
PROTHROMBIN TIME: 17 s — AB (ref 11.6–15.2)

## 2015-02-16 LAB — CBC
HCT: 25.4 % — ABNORMAL LOW (ref 39.0–52.0)
HEMOGLOBIN: 8.1 g/dL — AB (ref 13.0–17.0)
MCH: 27.2 pg (ref 26.0–34.0)
MCHC: 31.9 g/dL (ref 30.0–36.0)
MCV: 85.2 fL (ref 78.0–100.0)
PLATELETS: 207 10*3/uL (ref 150–400)
RBC: 2.98 MIL/uL — AB (ref 4.22–5.81)
RDW: 18.6 % — AB (ref 11.5–15.5)
WBC: 8.6 10*3/uL (ref 4.0–10.5)

## 2015-02-16 LAB — GLUCOSE, CAPILLARY
GLUCOSE-CAPILLARY: 110 mg/dL — AB (ref 65–99)
GLUCOSE-CAPILLARY: 105 mg/dL — AB (ref 65–99)

## 2015-02-16 LAB — HEPARIN LEVEL (UNFRACTIONATED): Heparin Unfractionated: 0.39 IU/mL (ref 0.30–0.70)

## 2015-02-16 MED ORDER — NEPRO/CARBSTEADY PO LIQD: 237.0000 mL | ORAL | Status: DC | PRN

## 2015-02-16 MED ORDER — LIDOCAINE HCL (PF) 1 % IJ SOLN: 5.0000 mL | INTRAMUSCULAR | Status: DC | PRN

## 2015-02-16 MED ORDER — OXYCODONE HCL 5 MG PO CAPS: 5.0000 mg | ORAL_CAPSULE | ORAL | Status: DC | PRN

## 2015-02-16 MED ORDER — WARFARIN SODIUM 6 MG PO TABS
6.0000 mg | ORAL_TABLET | Freq: Once | ORAL | Status: AC
Start: 2015-02-16 — End: 2015-02-16
  Administered 2015-02-16: 6 mg via ORAL
  Filled 2015-02-16: qty 1

## 2015-02-16 MED ORDER — HEPARIN SODIUM (PORCINE) 10000 UNIT/ML IJ SOLN: 19600.0000 [IU] | Freq: Two times a day (BID) | INTRAMUSCULAR | Status: DC

## 2015-02-16 MED ORDER — LIDOCAINE-PRILOCAINE 2.5-2.5 % EX CREA: 1.0000 "application " | TOPICAL_CREAM | CUTANEOUS | Status: DC | PRN

## 2015-02-16 MED ORDER — SODIUM CHLORIDE 0.9 % IV SOLN: 100.0000 mL | INTRAVENOUS | Status: DC | PRN

## 2015-02-16 MED ORDER — PENTAFLUOROPROP-TETRAFLUOROETH EX AERO: 1.0000 "application " | INHALATION_SPRAY | CUTANEOUS | Status: DC | PRN

## 2015-02-16 MED ORDER — HEPARIN SODIUM (PORCINE) 1000 UNIT/ML DIALYSIS: 1000.0000 [IU] | INTRAMUSCULAR | Status: DC | PRN

## 2015-02-16 MED ORDER — ENOXAPARIN (LOVENOX) PATIENT EDUCATION KIT
PACK | Freq: Once | Status: DC
  Filled 2015-02-16 (×2): qty 1

## 2015-02-16 MED ORDER — ALTEPLASE 2 MG IJ SOLR: 2.0000 mg | Freq: Once | INTRAMUSCULAR | Status: DC | PRN

## 2015-02-16 MED ORDER — ALTEPLASE 2 MG IJ SOLR
2.0000 mg | Freq: Once | INTRAMUSCULAR | Status: DC | PRN
  Filled 2015-02-16: qty 2

## 2015-02-16 MED ORDER — CLONIDINE HCL 0.1 MG/24HR TD PTWK
0.1000 mg | MEDICATED_PATCH | TRANSDERMAL | Status: DC
  Administered 2015-02-16: 0.1 mg via TRANSDERMAL
  Filled 2015-02-16: qty 1

## 2015-02-16 MED ORDER — WARFARIN SODIUM 6 MG PO TABS: 6.0000 mg | ORAL_TABLET | Freq: Every day | ORAL | Status: DC

## 2015-02-16 NOTE — Discharge Summary (Addendum)
Physician Discharge Summary  Frank [name] KZS:[id number] DOB: 1965/12/22 DOA: 02/10/2015  PCP: Angelica Chessman, MD  Admit date: 02/10/2015 Discharge date: 02/16/2015  Time spent: 65 minutes  Recommendations for Outpatient Follow-up:  1. Follow-up at hemodialysis center on Saturday, 02/16/2014. Patient will need a CBC done to follow-up on patient's hemoglobin. Patient needed PT/INR checked to follow-up on INR levels as patient is on chronic Coumadin therapy. 2. Follow-up with Dr. Hulen Skains or Dr. Donne Hazel of general surgery in 3 weeks. 3. Follow up with JEGEDE, OLUGBEMIGA, MD in 1 week. Patient will need his Coumadin levels checked and followed up upon. 4.   Discharge Diagnoses:  Principal Problem:   Incarcerated right inguinal hernia Active Problems:   ESRD on dialysis- Tues, Thurs, Sat in [employer], nephro: [name]   Chronic combined systolic and diastolic CHF (congestive heart failure)   DM (diabetes mellitus), type 2, uncontrolled, with renal complications   Hyperkalemia   Anemia of chronic disease   Dyslipidemia   HTN (hypertension)   Left upper extremity deep vein thrombosis   Inguinal hernia   Discharge Condition: Stable and improved  Diet recommendation: Heart healthy  Filed Weights   02/15/15 1730 02/15/15 2050 02/16/15 0750  Weight: 76.4 kg (168 lb 6.9 oz) 76.4 kg (168 lb 6.9 oz) 78.4 kg (172 lb 13.5 oz)    History of present illness:  Per Dr Daryll Drown Frank [name] is a 49yo man with extensive PMH, most importantly recent admission for LUE DVT per last discharge summary as well as chronic right IJ clot and chronic PE. He also has ESRD and is on HD. He was recently admitted as noted below. He also complained of chronic chest pain.   Admitted 7/12 - 7/16 at [facility] - found to have LUE DVT. On coumadin. Continued to have CP, thought to be MSK in nature. Also at that time with accelerated HTN due to noncompliance with medications.   Seen at [facility] 02/07/15 - 02/09/15:  Swelling in arm and hypertensive urgency. Found to have chronic clot in the Right IJ. He was sent home with heparin injections b/c he insisted on leaving the hospital. He was given coumadin as well. He had HD twice while at [facility]. He had a subtherapeutic INR at the time of admission. Per notes, he insisted on discharge and was given injectable heparin and coumadin to go home with.   On Day of admission, his main symptom was a knot in his stomach. He noted that he thinks the clot in his arm or neck moved into his lungs and then into his stomach. He is very concerned that the clots are moving all over his body. He told admitting MD, that he had a syncopal episode on the day of admission, in his car and had to be pulled out of his car "by strangers." He did not tell the same story to the ED providers. He said the syncope was brought on by the clots moving in to his abdomen. He has other chronic pains including in his right shoulder, which also "moves all around" and chest pain which is reproducible on palpation. Further symptoms include fatigue, epigastric pain, decreased PO intake. He reported taking his medications including injectable heparin given to him at discharge.   Objective findings in the ED showed hyperkalemia with K of 6.6, Creatinine increase to 10.75. Lipase was normal. AST was elevated to 61 (3:1 ratio AST to ALT). His INR was 1.49. H/H were stable, chronically low. Troponin I was  0.12, however, he has CKD.    Hospital Course:  #1 scrotal pain/suspected incarcerated right inguinal hernia with communicating hydrocele  Patient was admitted with complaints of abdominal pain. Abdominal ultrasound was ordered patient was placed on a PPI patient was placed on pain medication and monitored on telemetry. Abdominal ultrasound was negative for any acute abnormalities except moderate amount of stool noted in the colon. CT of the abdomen and pelvis was done that showed right  inguinal hernia containing free fluid unchanged. Small bilateral pleural effusions. A general surgery consultation was obtained and patient was seen in consultation by Dr. Donne Hazel on 02/12/2015. It was felt per general surgery that patient had a hernia in the right inguinal area that was tender and nonreducible. It was felt patient likely had a incarcerated right inguinal hernia. Patient's INR was reversed with FFP and vitamin K patient was subsequently taken to the operating room. Patient's heparin was held and patient subsequently underwent a repair of incarcerated right inguinal hernia on 02/14/2015 by Dr. Hulen Skains with no complications. Patient tolerated the procedure well. Patient was followed by general surgery throughout the hospitalization. Patient's pain was managed. Patient will follow-up with general surgery as outpatient. Patient was discharged in stable and improved condition.  #2 hyperkalemia Admission patient was noted to be hyperkalemic. Was felt patient's hypokalemia was likely multifactorial secondary to end-stage renal disease on hemodialysis. Patient was given insulin, bicarbonate, calcium gluconate. Patient refused Kayexalate. Nephrology was consulted and patient was followed throughout the hospitalization by nephrology. Patient's ACE inhibitor was initially held and subsequently resumed for better blood pressure control. Patient underwent hemodialysis during the hospitalization with resolution of his hyperkalemia. Patient will follow-up at his hemodialysis clinic on Saturday, 02/18/2015. Patient was discharged in stable and improved condition.  #3 left upper extremity DVT/right IJ thrombus Patient on admission was noted to be on chronic anticoagulation with Coumadin. Due to patient's suspected incarcerated right inguinal hernia patient's INR was reversed with vitamin K and FFP. Patient subsequently underwent surgery. Patient's Coumadin was subsequently resumed postsurgery and patient  started on IV heparin for overlap until INR was therapeutic. Patient was insistent on being discharged and stated that his Coumadin was bridged with subcutaneous heparin prior to admission had requested to be discharged on this. Patient's chart was reviewed via Epic and it was noted that patient due to insistent on being discharged at [facility] was discharged on heparin 19,600 units subcutaneous twice a day for bridging with Coumadin. Patient will subsequently be discharged on a similar regimen of heparin 19,600 units subcutaneously twice daily in addition to Coumadin 6 mg daily. Patient is to get a PT/INR checked during hemodialysis on Saturday, 02/18/2015 at which point his dose may be adjusted. Patient did not have any bleeding. Patient be discharged in stable and improved condition.  #4 end-stage renal disease on hemodialysis Tuesday Thursday Saturday Nephrology was consulted. Patient underwent hemodialysis during this hospitalization. Outpatient follow-up.  #5 hypertension Patient was noted to have some bouts of hypertensive urgency. Patient was resumed back on a home regimen with better blood pressure control. Outpatient follow-up.  The rest of patient's chronic medical issues remained stable throughout the hospitalization the patient be discharged in stable and improved condition.   Procedures:  Repair of incarcerated right inguinal hernia per Dr. Hulen Skains 02/14/2015  CT abdomen and pelvis 02/12/2015  Abdominal films 02/11/2015  Chest x-ray 02/10/2015  Abdominal ultrasound 02/11/2015  Consultations:  General surgery Dr. Donne Hazel 02/12/2015  Nephrology Dr. Joelyn Oms 02/11/2015    Discharge Exam:  Filed Vitals:   02/16/15 1657  BP: 147/95  Pulse: 92  Temp:   Resp: 18    General: NAD Cardiovascular: RRR Respiratory: CTAB  Discharge Instructions   Discharge Instructions    Diet - low sodium heart healthy    Complete by:  As directed      Discharge instructions     Complete by:  As directed   Follow up at HD on Saturday 02/18/15 for HD and labwork for coumadin. PT/INR. Follow up with General Surgery.     Increase activity slowly    Complete by:  As directed           Current Discharge Medication List    START taking these medications   Details  heparin 10000 UNIT/ML injection Inject 1.96 mLs (19,600 Units total) into the skin every 12 (twelve) hours. Qty: 28 mL, Refills: 0      CONTINUE these medications which have CHANGED   Details  oxycodone (OXY-IR) 5 MG capsule Take 1-2 capsules (5-10 mg total) by mouth every 4 (four) hours as needed for pain. Qty: 20 capsule, Refills: 0    warfarin (COUMADIN) 6 MG tablet Take 1 tablet (6 mg total) by mouth daily at 6 PM. Take daily until adjusted per MD Qty: 20 tablet, Refills: 0      CONTINUE these medications which have NOT CHANGED   Details  ALPRAZolam (XANAX) 0.5 MG tablet Take 0.5 mg by mouth 2 (two) times daily as needed for anxiety.    amLODipine (NORVASC) 10 MG tablet Take 10 mg by mouth daily.    atorvastatin (LIPITOR) 40 MG tablet Take 1 tablet (40 mg total) by mouth daily at 6 PM. Qty: 30 tablet, Refills: 1    carvedilol (COREG) 25 MG tablet Take 1 tablet (25 mg total) by mouth 2 (two) times daily with a meal.    cinacalcet (SENSIPAR) 30 MG tablet Take 30 mg by mouth daily.    cloNIDine (CATAPRES - DOSED IN MG/24 HR) 0.1 mg/24hr patch Place 0.1 mg onto the skin once a week.    docusate sodium (COLACE) 100 MG capsule Take 1 capsule (100 mg total) by mouth 2 (two) times daily. Qty: 60 capsule, Refills: 0    doxercalciferol (HECTOROL) 4 MCG/2ML injection Inject 1.25 mLs (2.5 mcg total) into the vein Every Tuesday,Thursday,and Saturday with dialysis. Qty: 2 mL    hydrALAZINE (APRESOLINE) 50 MG tablet Take 1 tablet (50 mg total) by mouth every 8 (eight) hours. Qty: 90 tablet, Refills: 1    hydrocerin (EUCERIN) CREA Apply 1 application topically 2 (two) times daily. Qty: 454 g,  Refills: 0    levalbuterol (XOPENEX HFA) 45 MCG/ACT inhaler Inhale 2 puffs into the lungs every 6 (six) hours as needed for wheezing or shortness of breath. Qty: 1 Inhaler, Refills: 2    lisinopril (PRINIVIL,ZESTRIL) 20 MG tablet Take 1 tablet (20 mg total) by mouth daily. Qty: 30 tablet, Refills: 1    methocarbamol (ROBAXIN) 500 MG tablet Take 2 tablets (1,000 mg total) by mouth every 8 (eight) hours as needed for muscle spasms. Qty: 20 tablet, Refills: 0    multivitamin (RENA-VIT) TABS tablet Take 1 tablet by mouth at bedtime. Qty: 30 tablet, Refills: 1    omeprazole (PRILOSEC) 20 MG capsule Take 20 mg by mouth daily.    ondansetron (ZOFRAN-ODT) 4 MG disintegrating tablet Take 1 tablet (4 mg total) by mouth every 8 (eight) hours as needed for nausea or vomiting. Qty: 20 tablet, Refills: 0  predniSONE (DELTASONE) 5 MG tablet Take 5 mg by mouth daily with breakfast.    sevelamer carbonate (RENVELA) 800 MG tablet Take 800-1,600 mg by mouth 3 (three) times daily with meals. 2 tabs three times daily with meals, and 1 tablet with snacks       Allergies  Allergen Reactions  . Ferrlecit [Na Ferric Gluc Cplx In Sucrose] Shortness Of Breath, Swelling and Other (See Comments)    Swelling in throat  . Darvocet [Propoxyphene N-Acetaminophen] Hives   Follow-up Information    Follow up with [name], MD. Schedule an appointment as soon as possible for a visit in 3 weeks.   Specialty:  General Surgery   Contact information:   [address] [place] [address] [phone]       Please follow up.   Why:  f/u at HD center for HD and PT/INR check.      Follow up with Angelica Chessman, MD. Schedule an appointment as soon as possible for a visit in 1 week.   Specialty:  Internal Medicine   Contact information:   [place] [address] [phone]        The results of significant diagnostics from this hospitalization (including imaging,  microbiology, ancillary and laboratory) are listed below for reference.    Significant Diagnostic Studies: Ct Abdomen Pelvis Wo Contrast  02/12/2015   CLINICAL DATA:  Acute onset right groin pain 1 day ago. Patient reports a right groin lesion. Initial encounter.  EXAM: CT ABDOMEN AND PELVIS WITHOUT CONTRAST  TECHNIQUE: Multidetector CT imaging of the abdomen and pelvis was performed following the standard protocol without IV contrast.  COMPARISON:  CT abdomen and pelvis 11/05/2014.  FINDINGS: The patient has small bilateral pleural effusions which are new since the prior exam. Mild dependent atelectasis is identified. There is cardiomegaly and small pericardial effusion, unchanged.  There is a small amount of abdominal and pelvic free-fluid. Catheter for peritoneal dialysis is identified. The patient has a right inguinal hernia containing free fluid. Its appearance is unchanged.  The kidneys are markedly atrophic consistent with end-stage renal disease. Renal transplant in the left lower quadrant is unchanged in appearance. There is extensive aortoiliac atherosclerosis without aneurysm. The spleen, pancreas, liver, gallbladder and adrenal glands are unremarkable. No lymphadenopathy is seen. No bony abnormality is identified.  IMPRESSION: No acute finding.  Right inguinal hernia containing free fluid is unchanged. Note is made that the patient is on peritoneal dialysis accounting for small volume of fluid in the abdomen and pelvis.  Small bilateral pleural effusions, new since the prior study.  No change in cardiomegaly and a small pericardial effusion.   Electronically Signed   By: Inge Rise M.D.   On: 02/12/2015 10:34   Dg Chest 2 View  02/10/2015   CLINICAL DATA:  Acute onset of generalized chest pain and lethargy. Shortness of breath. Initial encounter.  EXAM: CHEST  2 VIEW  COMPARISON:  Chest radiograph and CTA of the chest performed 01/31/2015  FINDINGS: The lungs are well-aerated. Mild vascular  congestion is noted. There is no evidence of focal opacification, pleural effusion or pneumothorax.  The heart is mildly enlarged. A right-sided dual-lumen catheter is noted ending within the right atrium. No acute osseous abnormalities are seen.  IMPRESSION: Mild vascular congestion and mild cardiomegaly noted. Lungs remain grossly clear.   Electronically Signed   By: Garald Balding M.D.   On: 02/10/2015 21:51   Dg Chest 2 View  01/31/2015  CLINICAL DATA:  Shortness of breath. Left-sided chest pain. Dialysis patient. History of blood clots.  EXAM: CHEST  2 VIEW  COMPARISON:  01/26/2015  FINDINGS: Mild cardiac enlargement without significant vascular congestion. Mediastinal contours appear intact. No focal airspace disease or consolidation in the lungs. No blunting of costophrenic angles. No pneumothorax. Unchanged appearance of right central venous catheter.  IMPRESSION: Mild cardiac enlargement.  No evidence of active pulmonary disease.   Electronically Signed   By: Lucienne Capers M.D.   On: 01/31/2015 01:24   Abd 1 View (kub)  02/11/2015   CLINICAL DATA:  Acute onset of generalized abdominal pain. Initial encounter.  EXAM: ABDOMEN - 1 VIEW  COMPARISON:  CT of the abdomen and pelvis from 11/05/2014, and abdominal radiograph performed 12/02/2014  FINDINGS: The visualized bowel gas pattern is unremarkable. Scattered air and stool filled loops of colon are seen; no abnormal dilatation of small bowel loops is seen to suggest small bowel obstruction. No free intra-abdominal air is identified, though evaluation for free air is limited on a single supine view.  The visualized osseous structures are within normal limits; the sacroiliac joints are unremarkable in appearance. A dialysis catheter is noted overlying the pelvis. The visualized lung bases are essentially clear.  IMPRESSION: Unremarkable bowel gas pattern; no free intra-abdominal air seen. Moderate amount of stool noted in the colon.   Electronically  Signed   By: Garald Balding M.D.   On: 02/11/2015 04:14   Ct Angio Chest Pe W/cm &/or Wo Cm  01/31/2015   CLINICAL DATA:  Patient woke up this evening with central chest pain and shortness of breath.  EXAM: CT ANGIOGRAPHY CHEST WITH CONTRAST  TECHNIQUE: Multidetector CT imaging of the chest was performed using the standard protocol during bolus administration of intravenous contrast. Multiplanar CT image reconstructions and MIPs were obtained to evaluate the vascular anatomy.  CONTRAST:  126m OMNIPAQUE IOHEXOL 350 MG/ML SOLN  COMPARISON:  10/07/2014  FINDINGS: Technically adequate study with good opacification of the central and segmental pulmonary arteries. No focal filling defects are demonstrated. No evidence of significant pulmonary embolus.  Cardiac enlargement. Mild dilatation of the proximal descending thoracic aorta at 3.8 cm diameter. No change since prior study. Enlarged lymph nodes demonstrated throughout the mediastinum. Largest lymph node is in the pretracheal region and measures about 16 mm short axis dimension. Lymph nodes appear similar to prior study and may represent reactive change. Esophagus is decompressed.  Evaluation of lungs is limited due to respiratory motion artifact. No evidence of focal consolidation or airspace disease. Airways appear patent. No pleural effusions. No pneumothorax.  Included portions of the upper abdominal organs demonstrate retrograde opacification of the hepatic veins suggesting right heart failure. No destructive bone lesions.  Review of the MIP images confirms the above findings.  IMPRESSION: Cardiac enlargement and contrast reflux into the hepatic veins suggesting right heart failure. No evidence of significant pulmonary embolus.   Electronically Signed   By: WLucienne CapersM.D.   On: 01/31/2015 05:02   UKoreaAbdomen Complete  02/11/2015   CLINICAL DATA:  Acute onset of midline abdominal pain. Initial encounter.  EXAM: ULTRASOUND ABDOMEN COMPLETE  COMPARISON:   CT of the abdomen and pelvis performed 11/05/2014  FINDINGS: Gallbladder: No gallstones or wall thickening visualized. No sonographic Murphy sign noted.  Common bile duct: Diameter: 0.4 cm, within normal limits in caliber.  Liver: No focal lesion identified. Within normal limits in parenchymal echogenicity.  IVC: No abnormality visualized.  Pancreas: Visualized portion unremarkable.  Spleen: Not characterized on this study.  Right Kidney: Length: 7.3 cm. Diffusely increased parenchymal echogenicity, with mild atrophy. A small 1.4 cm cyst is noted at the upper pole of the right kidney. No hydronephrosis visualized.  Left Kidney:  The native left kidney is not visualized.  Transplant Kidney: Length: 10.6 cm. Echogenicity within normal limits. No mass or hydronephrosis visualized.  Abdominal aorta: No aneurysm visualized.  Other findings: A small amount of ascites is noted at the left lower quadrant, with adjacent small bowel loops.  IMPRESSION: 1. No acute abnormality seen to explain the patient's symptoms. 2. Mild atrophy of the native right kidney, with findings of medical renal disease. Native left kidney not visualized. Transplant kidney is unremarkable in appearance. 3. Small amount of ascites at the left lower quadrant.   Electronically Signed   By: Garald Balding M.D.   On: 02/11/2015 04:01   Dg Chest Port 1 View  01/26/2015   CLINICAL DATA:  Central chest pain  EXAM: PORTABLE CHEST - 1 VIEW  COMPARISON:  01/09/2015  FINDINGS: Dialysis catheter from right IJ approach is in stable position, tip at the right atrium. Stable cardiomegaly. Stable aortic and hilar contours.  Baseline appearance of the lungs. There is no edema, consolidation, effusion, or pneumothorax.  No acute osseous findings.  IMPRESSION: Stable exam.  No acute cardiopulmonary disease.   Electronically Signed   By: Monte Fantasia M.D.   On: 01/26/2015 04:30    Microbiology: Recent Results (from the past 240 hour(s))  Surgical pcr screen      Status: None   Collection Time: 02/12/15 11:03 PM  Result Value Ref Range Status   MRSA, PCR NEGATIVE NEGATIVE Final   Staphylococcus aureus NEGATIVE NEGATIVE Final    Comment:        The Xpert SA Assay (FDA approved for NASAL specimens in patients over 79 years of age), is one component of a comprehensive surveillance program.  Test performance has been validated by [facility] for patients greater than or equal to 78 year old. It is not intended to diagnose infection nor to guide or monitor treatment.      Labs: Basic Metabolic Panel:  Recent Labs Lab 02/11/15 1833 02/12/15 0606 02/13/15 0539 02/14/15 0904 02/14/15 1954 02/15/15 0448 02/15/15 1120 02/16/15 0434  NA 133* 134* 130* 132* 135 132*  --  137  K 4.9 4.8 4.7 5.5* 4.4 5.9* 5.7* 4.7  CL 97* 97* 95*  --  99* 98*  --  102  CO2 _0 --  25 22  --  28  GLUCOSE 111* 134* 104* 77 73 66  --  95  BUN 27* 35* 47*  --  20 25*  --  21*  CREATININE 6.77* 8.09* 10.08*  --  6.16* 7.55*  --  7.04*  CALCIUM 7.3* 7.6* 7.3*  --  8.0* 8.4*  --  7.9*  PHOS 4.1  --   --   --  3.4  --   --  5.0*   Liver Function Tests:  Recent Labs Lab 02/10/15 2136 02/11/15 0442  02/12/15 0606 02/13/15 0539 02/14/15 1954 02/15/15 0448 02/16/15 0434  AST 61* 63*  --  52* 50*  --  64*  --   ALT 17 20  --  15* 14*  --  13*  --   ALKPHOS 98 95  --  85 94  --  90  --   BILITOT 0.6 0.8  --  0.5 0.4  --  0.9  --   PROT 6.4* 6.1*  --  5.9* 6.2*  --  7.0  --   ALBUMIN 2.2* 2.1*  < > 1.8* 1.9* 2.1* 2.2* 1.8*  < > = values in this interval not displayed.  Recent Labs Lab 02/10/15 2136  LIPASE 17*   No results for input(s): AMMONIA in the last 168 hours. CBC:  Recent Labs Lab 02/12/15 0606 02/13/15 0539 02/14/15 0904 02/14/15 1954 02/15/15 0448 02/16/15 0439  WBC 7.4 7.0  --  9.5 10.0 8.6  HGB 8.0* 8.4* 9.9* 9.6* 9.5* 8.1*  HCT 24.8* 26.0* 29.0* 30.0* 29.0* 25.4*  MCV 84.9 84.4  --  84.5 83.8 85.2  PLT 184 207  --  222  246 207   Cardiac Enzymes:  Recent Labs Lab 02/11/15 0442 02/11/15 1833  TROPONINI 0.09* 0.07*   BNP: BNP (last 3 results)  Recent Labs  11/26/14 2352 01/09/15 1529 01/26/15 0421  BNP >4500.0* 3606.1* >4500.0*    ProBNP (last 3 results)  Recent Labs  04/15/14 0809  PROBNP >70000.0*    CBG:  Recent Labs Lab 02/15/15 1114 02/15/15 1834 02/15/15 2048 02/16/15 1205 02/16/15 1654  GLUCAP 88 96 114* 110* 105*       Signed:  Burak Zerbe  MD [facility] 02/16/2015, 5:45 PM

## 2015-02-16 NOTE — Progress Notes (Signed)
ANTICOAGULATION CONSULT NOTE - Follow Up Consult  Pharmacy Consult for Heparin and COumadin Indication: pulmonary embolus and DVT  Allergies  Allergen Reactions  . Ferrlecit [Na Ferric Gluc Cplx In Sucrose] Shortness Of Breath, Swelling and Other (See Comments)    Swelling in throat  . Darvocet [Propoxyphene N-Acetaminophen] Hives    Patient Measurements: Height: _0  (188 cm) Weight: 172 lb 13.5 oz (78.4 kg) IBW/kg (Calculated) : 82.2 Heparin Dosing Weight:   Vital Signs: Temp: 98.6 F (37 C) (07/28 1210) Temp Source: Oral (07/28 1210) BP: 174/107 mmHg (07/28 1210) Pulse Rate: 87 (07/28 1210)  Labs:  Recent Labs  02/14/15 0519  02/14/15 1954 02/15/15 0448 02/15/15 1600 02/16/15 0434 02/16/15 0439  HGB  --   < > 9.6* 9.5*  --   --  8.1*  HCT  --   < > 30.0* 29.0*  --   --  25.4*  PLT  --   --  222 246  --   --  207  LABPROT 16.7*  --   --  16.4*  --   --  17.0*  INR 1.34  --   --  1.31  --   --  1.37  HEPARINUNFRC  --   --   --  0.17* 0.29*  --  0.39  CREATININE  --   --  6.16* 7.55*  --  7.04*  --   < > = values in this interval not displayed.  Estimated Creatinine Clearance: 13.9 mL/min (by C-G formula based on Cr of 7.04).   Medications:  Scheduled:  . amLODipine  10 mg Oral Daily  . atorvastatin  40 mg Oral q1800  . carvedilol  25 mg Oral BID WC  . cinacalcet  30 mg Oral Q breakfast  . cloNIDine  0.1 mg Transdermal Weekly  . docusate sodium  100 mg Oral BID  . enoxaparin   Does not apply Once  . hydrALAZINE  50 mg Oral 3 times per day  . insulin aspart  0-5 Units Subcutaneous QHS  . insulin aspart  0-9 Units Subcutaneous TID WC  . lisinopril  20 mg Oral Daily  . methocarbamol  1,000 mg Oral TID  . multivitamin  1 tablet Oral QHS  . pantoprazole (PROTONIX) IV  40 mg Intravenous Q24H  . polyethylene glycol  17 g Oral BID  . predniSONE  5 mg Oral Q breakfast  . sevelamer carbonate  1,600 mg Oral TID WC  . sodium chloride  3 mL Intravenous Q12H  .  sodium polystyrene  60 g Oral Once  . warfarin  6 mg Oral ONCE-1800  . Warfarin - Pharmacist Dosing Inpatient   Does not apply q1800    Assessment: 49yo male with acute RUE DVT and chronic PE/R-IJ clot, currently on heparin bridge to Coumadin.  INR remain subtherapeutic as expected with resumption of dosing on 7/27.  HL within goal, Hg 8.1- down some, pltc wnl.  No bleeding noted.    Enoxaparin was considered for bridging, but is not recommended in ESRD patients, therefore will continue with Heparin bridge.  Goal of Therapy:  INR 2-3 Heparin level 0.3-0.7 units/ml Monitor platelets by anticoagulation protocol: Yes   Plan:  Continue Heparin 2650 units/hr Repeat heparin level at 2PM to verify therapeutic x 2 Coumadin 60m po x 1 this evening Daily HL, INR, CBC Monitor for s/s of bleeding  [facility]acy Bruins [facility]

## 2015-02-16 NOTE — Progress Notes (Signed)
PT Cancellation/Discharge Note  Patient Details Name: Frank [name] MRN: [id number] DOB: Jul 20, 1966   Cancelled Treatment:    Reason Eval/Treat Not Completed: Patient declined, no reason specified.  Patient reports he is up ambulating in the hallway with PT's cane.  Reports he does not need further PT at this time.  PT will sign off.  Patient does need a single point cane for home use.   Despina Pole 02/16/2015, 1:33 PM Carita Pian. Sanjuana Kava, [place] Pager [phone]

## 2015-02-16 NOTE — Progress Notes (Signed)
Patient ID: Frank [name], male   DOB: 29-Sep-1965, 49 y.o.   MRN: [id number] 2 Days Post-Op  Subjective: Pt c/o burning.  Otherwise still has pain, but states he can walk and he is eager to go home.  Objective: Vital signs in last 24 hours: Temp:  [97.2 F (36.2 C)-98.6 F (37 C)] 97.2 F (36.2 C) (07/28 0750) Pulse Rate:  [52-95] 87 (07/28 0830) Resp:  [17-23] 23 (07/28 0750) BP: (142-177)/(93-127) 150/115 mmHg (07/28 0830) SpO2:  [93 %-99 %] 98 % (07/28 0750) Weight:  [76.4 kg (168 lb 6.9 oz)-78.4 kg (172 lb 13.5 oz)] 78.4 kg (172 lb 13.5 oz) (07/28 0750) Last BM Date: 02/13/15  Intake/Output from previous day: 07/27 0701 - 07/28 0700 In: 420 [P.O.:420] Out: 2000  Intake/Output this shift:    PE: Abd: soft, PD cath in place. +BS, incision c/d/i no evidence of hematoma currently.    Lab Results:   Recent Labs  02/15/15 0448 02/16/15 0439  WBC 10.0 8.6  HGB 9.5* 8.1*  HCT 29.0* 25.4*  PLT 246 207   BMET  Recent Labs  02/15/15 0448 02/15/15 1120 02/16/15 0434  NA 132*  --  137  K 5.9* 5.7* 4.7  CL 98*  --  102  CO2 22  --  28  GLUCOSE 66  --  95  BUN 25*  --  21*  CREATININE 7.55*  --  7.04*  CALCIUM 8.4*  --  7.9*   PT/INR  Recent Labs  02/15/15 0448 02/16/15 0439  LABPROT 16.4* 17.0*  INR 1.31 1.37   CMP     Component Value Date/Time   NA 137 02/16/2015 0434   K 4.7 02/16/2015 0434   CL 102 02/16/2015 0434   CO2 28 02/16/2015 0434   GLUCOSE 95 02/16/2015 0434   BUN 21* 02/16/2015 0434   CREATININE 7.04* 02/16/2015 0434   CALCIUM 7.9* 02/16/2015 0434   PROT 7.0 02/15/2015 0448   ALBUMIN 1.8* 02/16/2015 0434   AST 64* 02/15/2015 0448   ALT 13* 02/15/2015 0448   ALKPHOS 90 02/15/2015 0448   BILITOT 0.9 02/15/2015 0448   GFRNONAA 8* 02/16/2015 0434   GFRAA 9* 02/16/2015 0434   Lipase     Component Value Date/Time   LIPASE 17* 02/10/2015 2136       Studies/Results: No results found.  Anti-infectives: Anti-infectives    Start      Dose/Rate Route Frequency Ordered Stop   02/14/15 1011  polymyxin B 500,000 Units, bacitracin 50,000 Units in sodium chloride irrigation 0.9 % 500 mL irrigation  Status:  Discontinued       As needed 02/14/15 1011 02/14/15 1104   02/14/15 0915  [MAR Hold]  cefOXitin (MEFOXIN) 2 g in dextrose 5 % 50 mL IVPB     (MAR Hold since 02/14/15 0847)  Comments:  Pharmacy may adjust dosing strength, interval, or rate of medication as needed for optimal therapy for the patient Send with patient on call to the OR.  Anesthesia to complete antibiotic administration <9mn [facility]   2 g 100 mL/hr over 30 Minutes [facility] 02/14/15 0709 02/14/15 0940       Assessment/Plan   1. POD 2, s/p right inguinal hernia repair with mesh -patient is surgically stable for dc home.  He is tolerating a regular diet and pain seems controlled -he is on heparin drip and awaiting coumadin to become therapeutic.  hgb dropped from 9.5 to 8.1 overnight.  Will defer to primary service about his stability for dc home.  The patient is adamant to me that he goes home so he can return to work.  Will defer to primary as to whether he is truly stable for dc home.  I would prefer him to be out of work for 1 week for recovery, then he may return to work, but he is to not lift over 10-15lbs for 6 weeks.  Work note written.  LOS: 5 days    Hardie Veltre E 02/16/2015, 10:00 AM Pager: [phone]

## 2015-02-16 NOTE — Progress Notes (Signed)
[facility] Progress Note  Assessment/Plan: 1.  S/P Incarcerated right inguinal hernia repair : stable 2. Hyperkalemia - resolve  2. ESRD - TTS at [facility].  3. Anemia - HGB 8-9 rangeToday Follow CBCs.  4. Secondary hyperparathyroidism -  5. HTN/volume - stable, up 2-3 kg  6. Nutrition -renal diet  7. Abdominal Pain: Chronic: Per primary 7. LUE DVT on heparin gtt and warfarin; Per primary and on warfarin   Kelly Splinter MD (pgr) [phone]    ([phone] 02/16/2015, 10:55 AM    Subjective:    Objective Filed Vitals:   02/16/15 0830 02/16/15 0900 02/16/15 0930 02/16/15 1000  BP: 150/115 163/111 158/113 170/101  Pulse: 87 87 86 92  Temp:      TempSrc:      Resp:      Height:      Weight:      SpO2:       Physical Exam General: Well nourished NAD Heart: S1, S2 RRR  Lungs: Bilateral breath sounds CTA Abdomen: Soft non-tender. Active BS. Incision C/D/I Extremities: No edema. Pulses intact. Still has swelling R FA.  Dialysis Access: R. Tunneled perm cath   Dialysis Orders: TTS HD at [facility] 76 kg dry wt Additional Objective Labs: Basic Metabolic Panel:  Recent Labs Lab 02/11/15 1833  02/14/15 1954 02/15/15 0448 02/15/15 1120 02/16/15 0434  NA 133*  < > 135 132*  --  137  K 4.9  < > 4.4 5.9* 5.7* 4.7  CL 97*  < > 99* 98*  --  102  CO2 28  < > 25 22  --  28  GLUCOSE 111*  < > 73 66  --  95  BUN 27*  < > 20 25*  --  21*  CREATININE 6.77*  < > 6.16* 7.55*  --  7.04*  CALCIUM 7.3*  < > 8.0* 8.4*  --  7.9*  PHOS 4.1  --  3.4  --   --  5.0*  < > = values in this interval not displayed. Liver Function Tests:  Recent Labs Lab 02/12/15 0606 02/13/15 0539 02/14/15 1954 02/15/15 0448 02/16/15 0434  AST 52* 50*  --  64*  --   ALT 15* 14*  --  13*  --   ALKPHOS 85 94  --  90  --   BILITOT 0.5 0.4  --  0.9  --   PROT 5.9* 6.2*  --  7.0  --   ALBUMIN 1.8* 1.9* 2.1* 2.2* 1.8*    Recent Labs Lab 02/10/15 2136  LIPASE 17*    CBC:  Recent Labs Lab 02/12/15 0606 02/13/15 0539  02/14/15 1954 02/15/15 0448 02/16/15 0439  WBC 7.4 7.0  --  9.5 10.0 8.6  HGB 8.0* 8.4*  < > 9.6* 9.5* 8.1*  HCT 24.8* 26.0*  < > 30.0* 29.0* 25.4*  MCV 84.9 84.4  --  84.5 83.8 85.2  PLT 184 207  --  222 246 207  < > = values in this interval not displayed. Blood Culture    Component Value Date/Time   SDES BLOOD LEFT HAND 10/07/2014 0725   SPECREQUEST BOTTLES DRAWN AEROBIC AND ANAEROBIC 5CCS 10/07/2014 0725   CULT  10/07/2014 0725    NO GROWTH 5 DAYS Performed at [place] 10/13/2014 FINAL 10/07/2014 0725    Cardiac Enzymes:  Recent Labs Lab 02/11/15 0442 02/11/15 1833  TROPONINI 0.09* 0.07*   CBG:  Recent Labs Lab  02/15/15 0139 02/15/15 0748 02/15/15 1114 02/15/15 1834 02/15/15 2048  GLUCAP 87 76 88 96 114*   Iron Studies: No results for input(s): IRON, TIBC, TRANSFERRIN, FERRITIN in the last 72 hours. _0 @ Studies/Results: No results found. Medications: . sodium chloride 10 mL/hr at 02/14/15 0901  . heparin 2,650 Units/hr (02/16/15 1023)   . amLODipine  10 mg Oral Daily  . atorvastatin  40 mg Oral q1800  . carvedilol  25 mg Oral BID WC  . cinacalcet  30 mg Oral Q breakfast  . cloNIDine  0.1 mg Transdermal Weekly  . docusate sodium  100 mg Oral BID  . hydrALAZINE  50 mg Oral 3 times per day  . insulin aspart  0-5 Units Subcutaneous QHS  . insulin aspart  0-9 Units Subcutaneous TID WC  . lisinopril  20 mg Oral Daily  . methocarbamol  1,000 mg Oral TID  . multivitamin  1 tablet Oral QHS  . pantoprazole (PROTONIX) IV  40 mg Intravenous Q24H  . polyethylene glycol  17 g Oral BID  . predniSONE  5 mg Oral Q breakfast  . sevelamer carbonate  1,600 mg Oral TID WC  . sodium chloride  3 mL Intravenous Q12H  . sodium polystyrene  60 g Oral Once  . Warfarin - Pharmacist Dosing Inpatient   Does not apply [phone]

## 2015-02-18 ENCOUNTER — Other Ambulatory Visit (HOSPITAL_COMMUNITY)
Admission: AD | Admit: 2015-02-18 | Discharge: 2015-02-18 | Disposition: A | Discharge status: Home / Self Care | Payer: Medicare Other | Source: Ambulatory Visit | Attending: Internal Medicine | Admitting: Internal Medicine

## 2015-02-18 DIAGNOSIS — Z029 Encounter for administrative examinations, unspecified: Secondary | ICD-10-CM | POA: Insufficient documentation

## 2015-03-10 ENCOUNTER — Encounter (HOSPITAL_COMMUNITY): Payer: Self-pay | Admitting: Emergency Medicine

## 2015-03-10 ENCOUNTER — Emergency Department (HOSPITAL_COMMUNITY)
Admission: EM | Admit: 2015-03-10 | Discharge: 2015-03-11 | Disposition: A | Discharge status: Home / Self Care | Payer: Medicare Other | Attending: Emergency Medicine | Admitting: Emergency Medicine

## 2015-03-10 ENCOUNTER — Emergency Department (HOSPITAL_COMMUNITY): Payer: Medicare Other

## 2015-03-10 DIAGNOSIS — N186 End stage renal disease: Secondary | ICD-10-CM | POA: Insufficient documentation

## 2015-03-10 DIAGNOSIS — Z992 Dependence on renal dialysis: Secondary | ICD-10-CM | POA: Insufficient documentation

## 2015-03-10 DIAGNOSIS — Z862 Personal history of diseases of the blood and blood-forming organs and certain disorders involving the immune mechanism: Secondary | ICD-10-CM | POA: Diagnosis not present

## 2015-03-10 DIAGNOSIS — F419 Anxiety disorder, unspecified: Secondary | ICD-10-CM | POA: Diagnosis not present

## 2015-03-10 DIAGNOSIS — Z79899 Other long term (current) drug therapy: Secondary | ICD-10-CM | POA: Insufficient documentation

## 2015-03-10 DIAGNOSIS — Z7901 Long term (current) use of anticoagulants: Secondary | ICD-10-CM | POA: Insufficient documentation

## 2015-03-10 DIAGNOSIS — Z7952 Long term (current) use of systemic steroids: Secondary | ICD-10-CM | POA: Diagnosis not present

## 2015-03-10 DIAGNOSIS — N451 Epididymitis: Secondary | ICD-10-CM | POA: Insufficient documentation

## 2015-03-10 DIAGNOSIS — I120 Hypertensive chronic kidney disease with stage 5 chronic kidney disease or end stage renal disease: Secondary | ICD-10-CM | POA: Insufficient documentation

## 2015-03-10 DIAGNOSIS — F329 Major depressive disorder, single episode, unspecified: Secondary | ICD-10-CM | POA: Insufficient documentation

## 2015-03-10 DIAGNOSIS — N508 Other specified disorders of male genital organs: Secondary | ICD-10-CM | POA: Diagnosis present

## 2015-03-10 DIAGNOSIS — Z87891 Personal history of nicotine dependence: Secondary | ICD-10-CM | POA: Insufficient documentation

## 2015-03-10 DIAGNOSIS — E119 Type 2 diabetes mellitus without complications: Secondary | ICD-10-CM | POA: Diagnosis not present

## 2015-03-10 DIAGNOSIS — N50811 Right testicular pain: Secondary | ICD-10-CM

## 2015-03-10 DIAGNOSIS — I509 Heart failure, unspecified: Secondary | ICD-10-CM | POA: Insufficient documentation

## 2015-03-10 LAB — I-STAT CG4 LACTIC ACID, ED: Lactic Acid, Venous: 0.66 mmol/L (ref 0.5–2.0)

## 2015-03-10 MED ORDER — HYDROMORPHONE HCL 1 MG/ML IJ SOLN
1.0000 mg | Freq: Once | INTRAMUSCULAR | Status: AC
  Administered 2015-03-10: 1 mg via INTRAVENOUS
  Filled 2015-03-10: qty 1

## 2015-03-10 NOTE — ED Notes (Signed)
No redness around incision site.

## 2015-03-10 NOTE — ED Notes (Signed)
Patient here after hernia repair, having more pain at the site of the incision.  Patient denies any drainage from incision site.  Patient also states that he PT is low.

## 2015-03-10 NOTE — ED Provider Notes (Signed)
CSN: [id number]     Arrival date & time 03/10/15  2226 History   First MD Initiated Contact with Patient 03/10/15 2245     Chief Complaint  Patient presents with  . Post-op Problem     (Consider location/radiation/quality/duration/timing/severity/associated sxs/prior Treatment) Patient is a 49 y.o. male presenting with male genitourinary complaint. The history is provided by the patient.  Male GU Problem Presenting symptoms: scrotal pain   Scrotal pain:    Affected testicle:  Right   Severity:  Severe   Onset quality:  Gradual   Duration:  3 days   Timing:  Constant   Progression:  Worsening   Chronicity:  New Context comment:  Recent R inguinal hernia repair Relieved by:  Nothing Worsened by:  Nothing tried Associated symptoms: no abdominal pain, no fever and no vomiting     Past Medical History  Diagnosis Date  . Hypertension   . Depression   . Complication of anesthesia     itching, sore throat  . Diabetes mellitus without complication     No history per patient, but remains under history as A1c would not be accurate given on dialysis  . Shortness of breath   . Anxiety   . ESRD (end stage renal disease)     due to HTN per patient, followed at [facility], s/p failed kidney transplant - dialysis Tue, Th, Sat  . Renal insufficiency   . CHF (congestive heart failure)   . Anemia   . Dialysis patient    Past Surgical History  Procedure Laterality Date  . Kidney receipient  2006    failed and started HD in March 2014  . Capd insertion    . Capd removal    . Left heart catheterization with coronary angiogram N/A 09/02/2014    Procedure: LEFT HEART CATHETERIZATION WITH CORONARY ANGIOGRAM;  Surgeon: [name], MD;  [facility]: [facility] CATH LAB;  Service: Cardiovascular;  Laterality: N/A;  . Inguinal hernia repair Right 02/14/2015    Procedure: REPAIR INCARCERATED RIGHT INGUINAL HERNIA;  Surgeon: [name], MD;  Location: [place];  Service: General;  Laterality: Right;   No  family history on file. Social History  Substance Use Topics  . Smoking status: Former Smoker -- 1.00 packs/day for 1 years    Types: Cigarettes  . Smokeless tobacco: Never Used     Comment: quit Jan 2014  . Alcohol Use: No    Review of Systems  Constitutional: Negative for fever.  Respiratory: Negative for cough and shortness of breath.   Gastrointestinal: Negative for vomiting and abdominal pain.  All other systems reviewed and are negative.     Allergies  Ferrlecit and Darvocet  Home Medications   Prior to Admission medications   Medication Sig Start Date End Date Taking? Authorizing Provider  ALPRAZolam [name]) 0.5 MG tablet Take 0.5 mg by mouth 2 (two) times daily as needed for anxiety.    Historical Provider, MD  amLODipine (NORVASC) 10 MG tablet Take 10 mg by mouth daily.    Historical Provider, MD  atorvastatin (LIPITOR) 40 MG tablet Take 1 tablet (40 mg total) by mouth daily at 6 PM. 09/02/14   Barton Dubois, MD  carvedilol (COREG) 25 MG tablet Take 1 tablet (25 mg total) by mouth 2 (two) times daily with a meal. 10/10/14   [name], DO  cinacalcet (SENSIPAR) 30 MG tablet Take 30 mg by mouth daily.    Historical Provider, MD  cloNIDine (CATAPRES - DOSED IN MG/24 HR)  0.1 mg/24hr patch Place 0.1 mg onto the skin once a week.    Historical Provider, MD  docusate sodium (COLACE) 100 MG capsule Take 1 capsule (100 mg total) by mouth 2 (two) times daily. 11/19/14   [name], MD  doxercalciferol (HECTOROL) 4 MCG/2ML injection Inject 1.25 mLs (2.5 mcg total) into the vein Every Tuesday,Thursday,and Saturday with dialysis. 10/10/14   [name], DO  heparin 10000 UNIT/ML injection Inject 1.96 mLs (19,600 Units total) into the skin every 12 (twelve) hours. 02/16/15   Eugenie Filler, MD  hydrALAZINE (APRESOLINE) 50 MG tablet Take 1 tablet (50 mg total) by mouth every 8 (eight) hours. 02/04/15   [name], MD  hydrocerin (EUCERIN) CREA Apply 1 application topically 2  (two) times daily. 02/04/15   [name], MD  levalbuterol [facility] HFA) 45 MCG/ACT inhaler Inhale 2 puffs into the lungs every 6 (six) hours as needed for wheezing or shortness of breath. 11/19/14   [name], MD  lisinopril (PRINIVIL,ZESTRIL) 20 MG tablet Take 1 tablet (20 mg total) by mouth daily. 02/04/15   [name], MD  methocarbamol (ROBAXIN) 500 MG tablet Take 2 tablets (1,000 mg total) by mouth every 8 (eight) hours as needed for muscle spasms. 01/26/15   [name], MD  multivitamin (RENA-VIT) TABS tablet Take 1 tablet by mouth at bedtime. 02/04/15   [name], MD  omeprazole (PRILOSEC) 20 MG capsule Take 20 mg by mouth daily. 07/01/13   Historical Provider, MD  ondansetron (ZOFRAN-ODT) 4 MG disintegrating tablet Take 1 tablet (4 mg total) by mouth every 8 (eight) hours as needed for nausea or vomiting. 12/05/14   Verlee Monte, MD  oxycodone (OXY-IR) 5 MG capsule Take 1-2 capsules (5-10 mg total) by mouth every 4 (four) hours as needed for pain. 02/16/15   Eugenie Filler, MD  predniSONE (DELTASONE) 5 MG tablet Take 5 mg by mouth daily with breakfast.    Historical Provider, MD  sevelamer carbonate (RENVELA) 800 MG tablet Take 800-1,600 mg by mouth 3 (three) times daily with meals. 2 tabs three times daily with meals, and 1 tablet with snacks    Historical Provider, MD  warfarin (COUMADIN) 6 MG tablet Take 1 tablet (6 mg total) by mouth daily at 6 PM. Take daily until adjusted per MD 02/17/15   Eugenie Filler, MD   BP 179/118 mmHg  Pulse 86  Temp(Src) 98 F (36.7 C) (Oral)  Resp 16  SpO2 100% Physical Exam  Constitutional: He is oriented to person, place, and time. He appears well-developed and well-nourished. No distress.  HENT:  Head: Normocephalic and atraumatic.  Mouth/Throat: No oropharyngeal exudate.  Eyes: EOM are normal. Pupils are equal, round, and reactive to light.  Neck: Normal range of motion. Neck supple.  Cardiovascular: Normal rate and regular  rhythm.  Exam reveals no friction rub.   No murmur heard. Pulmonary/Chest: Effort normal and breath sounds normal. No respiratory distress. He has no wheezes. He has no rales.  Abdominal: He exhibits no distension. There is no tenderness. There is no rebound.    Genitourinary:     Musculoskeletal: Normal range of motion. He exhibits no edema.  Neurological: He is alert and oriented to person, place, and time.  Skin: He is not diaphoretic.    ED Course  Procedures (including critical care time) Labs Review Labs Reviewed  CBC  COMPREHENSIVE METABOLIC PANEL  URINALYSIS, ROUTINE W REFLEX MICROSCOPIC (NOT AT [facility])  I-STAT CG4  LACTIC ACID, ED    Imaging Review Ct Abdomen Pelvis Wo Contrast  03/11/2015   CLINICAL DATA:  Status post hernia repair, pain at incision site for 3 days. History of end-stage renal disease, on dialysis.  EXAM: CT ABDOMEN AND PELVIS WITHOUT CONTRAST  TECHNIQUE: Multidetector CT imaging of the abdomen and pelvis was performed following the standard protocol without IV contrast.  COMPARISON:  Scrotal ultrasound March 10, 2015 and CT abdomen and pelvis February 12, 2015  FINDINGS: LUNG BASES: The included heart is enlarged. Dependent atelectasis. Small pericardial effusions. Partially imaged dialysis catheter in RIGHT atrium.  KIDNEYS/BLADDER: Kidneys are orthotopic, and severely atrophic bilaterally. Stable 12 mm RIGHT upper pole density cyst. Stable 19 mm exophytic intermediate LEFT interpolar 15 mm cyst. Limited assessment for renal masses on this nonenhanced examination. The unopacified ureters are normal in course and caliber. Urinary bladder is decompressed and unremarkable.  SOLID ORGANS: The liver, spleen, gallbladder, pancreas and adrenal glands are unremarkable for this non-contrast examination.  GASTROINTESTINAL TRACT: The stomach, small and large bowel are normal in course and caliber without inflammatory changes, the sensitivity may be decreased by lack of enteric  contrast. Normal appendix.  PERITONEUM/RETROPERITONEUM: Peritoneal dialysis catheter terminates in the pelvis. Moderate to large amount of ascites, increased from prior examination. No intraperitoneal free air. LEFT pelvic kidney transplant. Aortoiliac vessels are normal in course and caliber, severe calcific atherosclerosis. No lymphadenopathy by CT size criteria. Prostate size appears normal. Seminal vesicle calcifications can be seen with diabetes.  SOFT TISSUES/ OSSEOUS STRUCTURES: Thickened RIGHT spermatic cord without discrete mass, fluid collection or definite bowel though limited by non contrast examination. Bilateral inguinal lymphadenopathy with reniform morphology is similar.  IMPRESSION: Thickened appearance of the RIGHT spermatic cord compatible with postoperative change, no discrete hernia sac. Mild inguinal lymphadenopathy is likely reactive.  Moderate to large amount of ascites with peritoneal dialysis catheter in place.  Stable cardiomegaly and small pericardial effusion.   Electronically Signed   By: Elon Alas M.D.   On: 03/11/2015 01:56   US Scrotum  03/11/2015   CLINICAL DATA:  Right testicular pain.  Rule out torsion.  EXAM: SCROTAL ULTRASOUND  DOPPLER ULTRASOUND OF THE TESTICLES  TECHNIQUE: Complete ultrasound examination of the testicles, epididymis, and other scrotal structures was performed. Color and spectral Doppler ultrasound were also utilized to evaluate blood flow to the testicles.  COMPARISON:  12/16/2014  FINDINGS: Right testicle  Measurements: 3.9 x 1.9 x 3 cm. No mass or microlithiasis visualized. There is mild capsular deformation by the phlegmonous changes around the epididymis.  Left testicle  Measurements: 3.2 x 1.9 x 3.8 cm. No mass or microlithiasis visualized.  Right epididymis: Thickened and heterogeneous with hypervascularity.  Left epididymis:  Thickened and heterogeneous with hypervascularity.  Varicocele:  Not well established in the setting of inflammation.   Pulsed Doppler interrogation of both testes demonstrates normal low resistance arterial and venous waveforms bilaterally.  The right inguinal canal cyst seen on the previous study was not visualized.  IMPRESSION: 1. Negative for testicular torsion. 2. Bilateral epididymitis, also seen 12/16/2014.   Electronically Signed   By: Monte Fantasia M.D.   On: 03/11/2015 00:23   [place] Art/ven Flow Abd Pelv Doppler  03/11/2015   CLINICAL DATA:  Right testicular pain.  Rule out torsion.  EXAM: SCROTAL ULTRASOUND  DOPPLER ULTRASOUND OF THE TESTICLES  TECHNIQUE: Complete ultrasound examination of the testicles, epididymis, and other scrotal structures was performed. Color and spectral Doppler ultrasound were also utilized to evaluate blood  flow to the testicles.  COMPARISON:  12/16/2014  FINDINGS: Right testicle  Measurements: 3.9 x 1.9 x 3 cm. No mass or microlithiasis visualized. There is mild capsular deformation by the phlegmonous changes around the epididymis.  Left testicle  Measurements: 3.2 x 1.9 x 3.8 cm. No mass or microlithiasis visualized.  Right epididymis: Thickened and heterogeneous with hypervascularity.  Left epididymis:  Thickened and heterogeneous with hypervascularity.  Varicocele:  Not well established in the setting of inflammation.  Pulsed Doppler interrogation of both testes demonstrates normal low resistance arterial and venous waveforms bilaterally.  The right inguinal canal cyst seen on the previous study was not visualized.  IMPRESSION: 1. Negative for testicular torsion. 2. Bilateral epididymitis, also seen 12/16/2014.   Electronically Signed   By: Monte Fantasia M.D.   On: 03/11/2015 00:23   I have personally reviewed and evaluated these images and lab results as part of my medical decision-making.   EKG Interpretation None      MDM   Final diagnoses:  Epididymitis    49 year old male here with right scrotal pain. Been present for 3-4 days. Had recent right inguinal hernia repair.  He denies any vomiting, nausea, diarrhea. He denies difficulty urinating. Hears vitals show hypertension, but otherwise stable. He has exquisitely tender right scrotum with swelling. He is actively guarding or pulls my hands away. He has no belly pain. Surgical site is well appearing. We'll ultrasound his scrotum to see if he is a new hernia or possible testicular torsion.  Ultrasound shows bilateral epididymitis, similar previous.  CT shows some spermatic cord thickening. No redness of his scrotum, no perineal redness. No concern for Fournier's. No white count.  Stable for discharge.  Evelina Bucy, MD 03/11/15 [phone]

## 2015-03-11 ENCOUNTER — Emergency Department (HOSPITAL_COMMUNITY): Payer: Medicare Other

## 2015-03-11 LAB — COMPREHENSIVE METABOLIC PANEL
ALT: 11 U/L — ABNORMAL LOW (ref 17–63)
AST: 46 U/L — ABNORMAL HIGH (ref 15–41)
Albumin: 2 g/dL — ABNORMAL LOW (ref 3.5–5.0)
Alkaline Phosphatase: 92 U/L (ref 38–126)
Anion gap: 11 (ref 5–15)
BUN: 30 mg/dL — ABNORMAL HIGH (ref 6–20)
CO2: 26 mmol/L (ref 22–32)
Calcium: 7.8 mg/dL — ABNORMAL LOW (ref 8.9–10.3)
Chloride: 103 mmol/L (ref 101–111)
Creatinine, Ser: 10.04 mg/dL — ABNORMAL HIGH (ref 0.61–1.24)
GFR calc Af Amer: 6 mL/min — ABNORMAL LOW (ref >60)
GFR, EST NON AFRICAN AMERICAN: 5 mL/min — AB (ref >60)
Glucose, Bld: 89 mg/dL (ref 65–99)
Potassium: 4.3 mmol/L (ref 3.5–5.1)
Sodium: 140 mmol/L (ref 135–145)
Total Bilirubin: 0.5 mg/dL (ref 0.3–1.2)
Total Protein: 6.7 g/dL (ref 6.5–8.1)

## 2015-03-11 LAB — CBC
HCT: 22.1 % — ABNORMAL LOW (ref 39.0–52.0)
Hemoglobin: 7 g/dL — ABNORMAL LOW (ref 13.0–17.0)
MCH: 26.5 pg (ref 26.0–34.0)
MCHC: 31.7 g/dL (ref 30.0–36.0)
MCV: 83.7 fL (ref 78.0–100.0)
PLATELETS: 275 10*3/uL (ref 150–400)
RBC: 2.64 MIL/uL — AB (ref 4.22–5.81)
RDW: 17.9 % — ABNORMAL HIGH (ref 11.5–15.5)
WBC: 5.4 10*3/uL (ref 4.0–10.5)

## 2015-03-11 LAB — PROTIME-INR
INR: 1.15 (ref 0.00–1.49)
PROTHROMBIN TIME: 14.9 s (ref 11.6–15.2)

## 2015-03-11 MED ORDER — LEVOFLOXACIN 750 MG PO TABS
750.0000 mg | ORAL_TABLET | Freq: Once | ORAL | Status: AC
  Administered 2015-03-11: 750 mg via ORAL
  Filled 2015-03-11: qty 1

## 2015-03-11 MED ORDER — OXYCODONE-ACETAMINOPHEN 5-325 MG PO TABS
2.0000 | ORAL_TABLET | Freq: Once | ORAL | Status: AC
  Administered 2015-03-11: 2 via ORAL
  Filled 2015-03-11: qty 2

## 2015-03-11 MED ORDER — OXYCODONE HCL 5 MG PO TABS: 5.0000 mg | ORAL_TABLET | ORAL | Status: DC | PRN

## 2015-03-11 MED ORDER — LEVOFLOXACIN 500 MG PO TABS: 500.0000 mg | ORAL_TABLET | Freq: Every day | ORAL | Status: DC

## 2015-03-11 NOTE — Discharge Instructions (Signed)
Epididymitis Epididymitis is a swelling (inflammation) of the epididymis. The epididymis is a cord-like structure along the back part of the testicle. Epididymitis is usually, but not always, caused by infection. This is usually a sudden problem beginning with chills, fever and pain behind the scrotum and in the testicle. There may be swelling and redness of the testicle. DIAGNOSIS  Physical examination will reveal a tender, swollen epididymis. Sometimes, cultures are obtained from the urine or from prostate secretions to help find out if there is an infection or if the cause is a different problem. Sometimes, blood work is performed to see if your white blood cell count is elevated and if a germ (bacterial) or viral infection is present. Using this knowledge, an appropriate medicine which kills germs (antibiotic) can be chosen by your caregiver. A viral infection causing epididymitis will most often go away (resolve) without treatment. HOME CARE INSTRUCTIONS   Hot sitz baths for 20 minutes, 4 times per day, may help relieve pain.  Only take over-the-counter or prescription medicines for pain, discomfort or fever as directed by your caregiver.  Take all medicines, including antibiotics, as directed. Take the antibiotics for the full prescribed length of time even if you are feeling better.  It is very important to keep all follow-up appointments. SEEK IMMEDIATE MEDICAL CARE IF:   You have a fever.  You have pain not relieved with medicines.  You have any worsening of your problems.  Your pain seems to come and go.  You develop pain, redness, and swelling in the scrotum and surrounding areas. MAKE SURE YOU:   Understand these instructions.  Will watch your condition.  Will get help right away if you are not doing well or get worse. Document Released: 07/05/2000 Document Revised: 09/30/2011 Document Reviewed: 05/25/2009 [facility] Patient Information 2015 [place], [place]. This information  is not intended to replace advice given to you by your health care provider. Make sure you discuss any questions you have with your health care provider.

## 2015-03-11 NOTE — ED Notes (Signed)
Pt. Left with all belongings. Discharge instructions were reviewed and all questions were answered.

## 2015-03-20 ENCOUNTER — Ambulatory Visit (HOSPITAL_COMMUNITY): Admission: RE | Admit: 2015-03-20 | Payer: Medicare Other | Source: Ambulatory Visit

## 2015-03-20 ENCOUNTER — Emergency Department (HOSPITAL_COMMUNITY)
Admission: EM | Admit: 2015-03-20 | Discharge: 2015-03-20 | Disposition: A | Discharge status: Home / Self Care | Payer: Medicare Other | Attending: Emergency Medicine | Admitting: Emergency Medicine

## 2015-03-20 ENCOUNTER — Encounter (HOSPITAL_COMMUNITY): Payer: Self-pay | Admitting: Emergency Medicine

## 2015-03-20 DIAGNOSIS — Z862 Personal history of diseases of the blood and blood-forming organs and certain disorders involving the immune mechanism: Secondary | ICD-10-CM | POA: Insufficient documentation

## 2015-03-20 DIAGNOSIS — F419 Anxiety disorder, unspecified: Secondary | ICD-10-CM | POA: Insufficient documentation

## 2015-03-20 DIAGNOSIS — E119 Type 2 diabetes mellitus without complications: Secondary | ICD-10-CM | POA: Insufficient documentation

## 2015-03-20 DIAGNOSIS — Z87891 Personal history of nicotine dependence: Secondary | ICD-10-CM | POA: Insufficient documentation

## 2015-03-20 DIAGNOSIS — Z7901 Long term (current) use of anticoagulants: Secondary | ICD-10-CM | POA: Diagnosis not present

## 2015-03-20 DIAGNOSIS — I509 Heart failure, unspecified: Secondary | ICD-10-CM | POA: Insufficient documentation

## 2015-03-20 DIAGNOSIS — Z79899 Other long term (current) drug therapy: Secondary | ICD-10-CM | POA: Diagnosis not present

## 2015-03-20 DIAGNOSIS — N509 Disorder of male genital organs, unspecified: Secondary | ICD-10-CM

## 2015-03-20 DIAGNOSIS — R2233 Localized swelling, mass and lump, upper limb, bilateral: Secondary | ICD-10-CM | POA: Diagnosis not present

## 2015-03-20 DIAGNOSIS — M7989 Other specified soft tissue disorders: Secondary | ICD-10-CM

## 2015-03-20 DIAGNOSIS — N186 End stage renal disease: Secondary | ICD-10-CM | POA: Diagnosis not present

## 2015-03-20 DIAGNOSIS — N508 Other specified disorders of male genital organs: Secondary | ICD-10-CM | POA: Diagnosis not present

## 2015-03-20 DIAGNOSIS — Z992 Dependence on renal dialysis: Secondary | ICD-10-CM | POA: Diagnosis not present

## 2015-03-20 DIAGNOSIS — Z792 Long term (current) use of antibiotics: Secondary | ICD-10-CM | POA: Diagnosis not present

## 2015-03-20 DIAGNOSIS — F329 Major depressive disorder, single episode, unspecified: Secondary | ICD-10-CM | POA: Diagnosis not present

## 2015-03-20 DIAGNOSIS — I120 Hypertensive chronic kidney disease with stage 5 chronic kidney disease or end stage renal disease: Secondary | ICD-10-CM | POA: Insufficient documentation

## 2015-03-20 DIAGNOSIS — N50819 Testicular pain, unspecified: Secondary | ICD-10-CM

## 2015-03-20 LAB — COMPREHENSIVE METABOLIC PANEL
ALBUMIN: 2.1 g/dL — AB (ref 3.5–5.0)
ALT: 8 U/L — ABNORMAL LOW (ref 17–63)
ANION GAP: 12 (ref 5–15)
AST: 40 U/L (ref 15–41)
Alkaline Phosphatase: 111 U/L (ref 38–126)
BUN: 63 mg/dL — AB (ref 6–20)
CALCIUM: 8.1 mg/dL — AB (ref 8.9–10.3)
CO2: 23 mmol/L (ref 22–32)
Chloride: 103 mmol/L (ref 101–111)
Creatinine, Ser: 14.76 mg/dL — ABNORMAL HIGH (ref 0.61–1.24)
GFR calc Af Amer: 4 mL/min — ABNORMAL LOW (ref >60)
GFR calc non Af Amer: 3 mL/min — ABNORMAL LOW (ref >60)
GLUCOSE: 95 mg/dL (ref 65–99)
Potassium: 5 mmol/L (ref 3.5–5.1)
SODIUM: 138 mmol/L (ref 135–145)
Total Bilirubin: 0.4 mg/dL (ref 0.3–1.2)
Total Protein: 6.3 g/dL — ABNORMAL LOW (ref 6.5–8.1)

## 2015-03-20 LAB — CBC WITH DIFFERENTIAL/PLATELET
BASOS PCT: 0 % (ref 0–1)
Basophils Absolute: 0 10*3/uL (ref 0.0–0.1)
EOS ABS: 0.2 10*3/uL (ref 0.0–0.7)
Eosinophils Relative: 3 % (ref 0–5)
HEMATOCRIT: 24.9 % — AB (ref 39.0–52.0)
Hemoglobin: 8.1 g/dL — ABNORMAL LOW (ref 13.0–17.0)
Lymphocytes Relative: 18 % (ref 12–46)
Lymphs Abs: 1.1 10*3/uL (ref 0.7–4.0)
MCH: 26.7 pg (ref 26.0–34.0)
MCHC: 32.5 g/dL (ref 30.0–36.0)
MCV: 82.2 fL (ref 78.0–100.0)
Monocytes Absolute: 0.9 10*3/uL (ref 0.1–1.0)
Monocytes Relative: 15 % — ABNORMAL HIGH (ref 3–12)
NEUTROS PCT: 63 % (ref 43–77)
Neutro Abs: 3.7 10*3/uL (ref 1.7–7.7)
Platelets: 228 10*3/uL (ref 150–400)
RBC: 3.03 MIL/uL — AB (ref 4.22–5.81)
RDW: 18.3 % — ABNORMAL HIGH (ref 11.5–15.5)
WBC: 5.9 10*3/uL (ref 4.0–10.5)

## 2015-03-20 LAB — PROTIME-INR
INR: 1.91 — ABNORMAL HIGH (ref 0.00–1.49)
Prothrombin Time: 21.8 seconds — ABNORMAL HIGH (ref 11.6–15.2)

## 2015-03-20 NOTE — ED Notes (Signed)
Pt. Left with all belongings. Discharge instructions were reviewed and all questions were answered.  

## 2015-03-20 NOTE — ED Provider Notes (Signed)
CSN: [id number]     Arrival date & time 03/20/15  0019 History   This chart was scribed for Frank Rice, MD by [name], ED Scribe. This patient was seen in room D30C/D30C and the patient's care was started 1:09 AM.   Chief Complaint  Patient presents with  . Arm Swelling   The history is provided by the patient. No language interpreter was used.    HPI Comments: Frank [name] is a 49 y.o. male with a PMHx of HTN, DM, anxiety, CHF and ESRD on peritoneal dialysis who presents to the Emergency Department complaining of constant, ongoing swelling to the groin x 2 weeks. He also reports bilateral arm swelling with pain onset 2-3 hours prior to arrival. Denies any recent injury or trauma. No noted lower extremity swelling. [name] was evaluated 8/20 for scrotal pain. Prior to discharge pt was diagnosed with epididymidis. No recent fever or chills. No shortness of breath or chest pain.  Past Medical History  Diagnosis Date  . Hypertension   . Depression   . Complication of anesthesia     itching, sore throat  . Diabetes mellitus without complication     No history per patient, but remains under history as A1c would not be accurate given on dialysis  . Shortness of breath   . Anxiety   . ESRD (end stage renal disease)     due to HTN per patient, followed at [facility], s/p failed kidney transplant - dialysis Tue, Th, Sat  . Renal insufficiency   . CHF (congestive heart failure)   . Anemia   . Dialysis patient    Past Surgical History  Procedure Laterality Date  . Kidney receipient  2006    failed and started HD in March 2014  . Capd insertion    . Capd removal    . Left heart catheterization with coronary angiogram N/A 09/02/2014    Procedure: LEFT HEART CATHETERIZATION WITH CORONARY ANGIOGRAM;  Surgeon: [name], MD;  Location: [facility] CATH LAB;  Service: Cardiovascular;  Laterality: N/A;  . Inguinal hernia repair Right 02/14/2015    Procedure: REPAIR INCARCERATED RIGHT INGUINAL  HERNIA;  Surgeon: [name], MD;  Location: [facility];  Service: General;  Laterality: Right;   No family history on file. Social History  Substance Use Topics  . Smoking status: Former Smoker -- 1.00 packs/day for 1 years    Types: Cigarettes  . Smokeless tobacco: Never Used     Comment: quit Jan 2014  . Alcohol Use: No    Review of Systems  Constitutional: Negative for fever and chills.  Respiratory: Negative for cough and shortness of breath.   Cardiovascular: Negative for chest pain, palpitations and leg swelling.  Gastrointestinal: Negative for nausea, vomiting and abdominal pain.  Genitourinary: Positive for scrotal swelling. Negative for discharge and penile pain.  Musculoskeletal: Negative for joint swelling and arthralgias.  Skin: Negative for rash and wound.  Neurological: Negative for dizziness, weakness, light-headedness, numbness and headaches.  Psychiatric/Behavioral: Negative for confusion.  All other systems reviewed and are negative.     Allergies  Ferrlecit and Darvocet  Home Medications   Prior to Admission medications   Medication Sig Start Date End Date Taking? Authorizing Provider  ALPRAZolam [name]) 0.5 MG tablet Take 0.5 mg by mouth 2 (two) times daily as needed for anxiety.   Yes Historical Provider, MD  amLODipine (NORVASC) 10 MG tablet Take 10 mg by mouth daily.   Yes Historical Provider, MD  atorvastatin (  LIPITOR) 40 MG tablet Take 1 tablet (40 mg total) by mouth daily at 6 PM. 09/02/14  Yes Barton Dubois, MD  carvedilol (COREG) 25 MG tablet Take 1 tablet (25 mg total) by mouth 2 (two) times daily with a meal. 10/10/14  Yes [name], DO  cinacalcet (SENSIPAR) 30 MG tablet Take 30 mg by mouth daily.   Yes Historical Provider, MD  cloNIDine (CATAPRES - DOSED IN MG/24 HR) 0.1 mg/24hr patch Place 0.1 mg onto the skin once a week.   Yes Historical Provider, MD  docusate sodium (COLACE) 100 MG capsule Take 1 capsule (100 mg total) by mouth 2 (two) times  daily. 11/19/14  Yes [name], MD  doxercalciferol (HECTOROL) 4 MCG/2ML injection Inject 1.25 mLs (2.5 mcg total) into the vein Every Tuesday,Thursday,and Saturday with dialysis. 10/10/14  Yes [name], DO  hydrALAZINE (APRESOLINE) 50 MG tablet Take 1 tablet (50 mg total) by mouth every 8 (eight) hours. 02/04/15  Yes [name], MD  hydrocerin (EUCERIN) CREA Apply 1 application topically 2 (two) times daily. 02/04/15  Yes [name], MD  levalbuterol [facility] HFA) 45 MCG/ACT inhaler Inhale 2 puffs into the lungs every 6 (six) hours as needed for wheezing or shortness of breath. 11/19/14  Yes [name], MD  levofloxacin (LEVAQUIN) 500 MG tablet Take 1 tablet (500 mg total) by mouth daily. 03/11/15  Yes Evelina Bucy, MD  lisinopril (PRINIVIL,ZESTRIL) 20 MG tablet Take 1 tablet (20 mg total) by mouth daily. 02/04/15  Yes [name], MD  methocarbamol (ROBAXIN) 500 MG tablet Take 2 tablets (1,000 mg total) by mouth every 8 (eight) hours as needed for muscle spasms. 01/26/15  Yes Frank Rice, MD  multivitamin (RENA-VIT) TABS tablet Take 1 tablet by mouth at bedtime. 02/04/15  Yes [name], MD  omeprazole (PRILOSEC) 20 MG capsule Take 20 mg by mouth daily. 07/01/13  Yes Historical Provider, MD  ondansetron (ZOFRAN-ODT) 4 MG disintegrating tablet Take 1 tablet (4 mg total) by mouth every 8 (eight) hours as needed for nausea or vomiting. 12/05/14  Yes Verlee Monte, MD  oxycodone (OXY-IR) 5 MG capsule Take 1-2 capsules (5-10 mg total) by mouth every 4 (four) hours as needed for pain. 02/16/15  Yes Eugenie Filler, MD  oxyCODONE (ROXICODONE) 5 MG immediate release tablet Take 1 tablet (5 mg total) by mouth every 4 (four) hours as needed for severe pain. 03/11/15  Yes Evelina Bucy, MD  predniSONE (DELTASONE) 5 MG tablet Take 5 mg by mouth daily with breakfast.   Yes Historical Provider, MD  sevelamer carbonate (RENVELA) 800 MG tablet Take 800-1,600 mg by mouth 3 (three) times daily with  meals. 2 tabs three times daily with meals, and 1 tablet with snacks   Yes Historical Provider, MD  warfarin (COUMADIN) 6 MG tablet Take 1 tablet (6 mg total) by mouth daily at 6 PM. Take daily until adjusted per MD 02/17/15  Yes Eugenie Filler, MD  heparin 10000 UNIT/ML injection Inject 1.96 mLs (19,600 Units total) into the skin every 12 (twelve) hours. Patient not taking: Reported on 03/20/2015 02/16/15   Eugenie Filler, MD   Triage Vitals: BP 182/97 mmHg  Pulse 59  Temp(Src) 98.8 F (37.1 C) (Oral)  Resp 16  Ht _0  (1.88 m)  Wt 177 lb (80.287 kg)  BMI 22.72 kg/m2  SpO2 94%   Physical Exam  Constitutional: He is oriented to person, place, and time. He appears well-developed and  well-nourished. No distress.  HENT:  Head: Normocephalic and atraumatic.  Mouth/Throat: Oropharynx is clear and moist.  Eyes: EOM are normal. Pupils are equal, round, and reactive to light.  Neck: Normal range of motion. Neck supple.  Cardiovascular: Normal rate and regular rhythm.   Pulmonary/Chest: Effort normal and breath sounds normal. No respiratory distress. He has no wheezes. He has no rales. He exhibits no tenderness.  Abdominal: Soft. Bowel sounds are normal. He exhibits distension. He exhibits no mass. There is no tenderness. There is no rebound and no guarding.  Genitourinary:  Patient with right inguinal surgical wound that appears to be well-healing. Patient is uncooperative with testicular exam. There appears to be more tenderness in the right testicle in the left. Mild swelling noted. Both testicles appear to be in a normal lie.  Musculoskeletal: Normal range of motion. He exhibits no edema or tenderness.  Right upper extremity appears enlarged compared to left. Distal pulses intact. No warmth, erythema.   Neurological: He is alert and oriented to person, place, and time.  5/5 motor in all extremities. Sensation is fully intact.  Skin: Skin is warm and dry. No rash noted. No erythema.   Psychiatric: He has a normal mood and affect. His behavior is normal.  Nursing note and vitals reviewed.   ED Course  Procedures (including critical care time)  DIAGNOSTIC STUDIES: Oxygen Saturation is 100% on RA, Normal by my interpretation.    COORDINATION OF CARE: 1:15 AM-Discussed treatment plan with pt at bedside and pt agreed to plan.     Labs Review Labs Reviewed  CBC WITH DIFFERENTIAL/PLATELET - Abnormal; Notable for the following:    RBC 3.03 (*)    Hemoglobin 8.1 (*)    HCT 24.9 (*)    RDW 18.3 (*)    Monocytes Relative 15 (*)    All other components within normal limits  COMPREHENSIVE METABOLIC PANEL - Abnormal; Notable for the following:    BUN 63 (*)    Creatinine, Ser 14.76 (*)    Calcium 8.1 (*)    Total Protein 6.3 (*)    Albumin 2.1 (*)    ALT 8 (*)    GFR calc non Af Amer 3 (*)    GFR calc Af Amer 4 (*)    All other components within normal limits  PROTIME-INR - Abnormal; Notable for the following:    Prothrombin Time 21.8 (*)    INR 1.91 (*)    All other components within normal limits    Imaging Review No results found. I have personally reviewed and evaluated these images and lab results as part of my medical decision-making.   EKG Interpretation None      MDM   Final diagnoses:  Arm swelling  Persistent testicular pain   I personally performed the services described in this documentation, which was scribed in my presence. The recorded information has been reviewed and is accurate.  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 He is currently on Coumadin for this. INR is nearly therapeutic. We'll repeat bilateral upper extremity ultrasounds in the morning to rule out new DVT. Return precautions given.  Patient also instructed to follow-up with urology given his chronic testicular pain. No evidence of torsion. Do not believe ultrasound needs to be repeated. Patient is currently taking Levaquin.  Advised follow-up with urology.  Frank Rice, MD 03/20/15 [phone]

## 2015-03-20 NOTE — ED Notes (Signed)
Pt. reports bilateral arm swelling with pain onset this evening , denies injury, pt. added right groin pain , respirations unlabored /ambulatory.

## 2015-03-20 NOTE — Discharge Instructions (Signed)
Follow-up to have the ultrasound performed in the morning. Continue to take her Coumadin as instructed. Return immediately for difficulty breathing or chest pain. Follow-up with your primary physician.  For your ongoing testicular pain, call and make an appointment to follow-up with urology.   Emergency Department Resource Guide 1) Find a Doctor and Pay Out of Pocket Although you won't have to find out who is covered by your insurance plan, it is a good idea to ask around and get recommendations. You will then need to call the office and see if the doctor you have chosen will accept you as a new patient and what types of options they offer for patients who are self-pay. Some doctors offer discounts or will set up payment plans for their patients who do not have insurance, but you will need to ask so you aren't surprised when you get to your appointment.  2) Contact Your Local Health Department Not all health departments have doctors that can see patients for sick visits, but many do, so it is worth a call to see if yours does. If you don't know where your local health department is, you can check in your phone book. The CDC also has a tool to help you locate your state's health department, and many state websites also have listings of all of their local health departments.  3) Find a [facility] If your illness is not likely to be very severe or complicated, you may want to try a walk in clinic. These are popping up all over the country in pharmacies, drugstores, and shopping centers. They're usually staffed by nurse practitioners or physician assistants that have been trained to treat common illnesses and complaints. They're usually fairly quick and inexpensive. However, if you have serious medical issues or chronic medical problems, these are probably not your best option.  No Primary Care Doctor: - Call Health Connect at  [phone] - they can help you locate a primary care doctor that  accepts  your insurance, provides certain services, etc. - Physician Referral Service- [phone]  Chronic Pain Problems: Organization         Address  Phone   Notes  [facility]  [phone] Patients need to be referred by their primary care doctor.   Medication Assistance: Organization         Address  Phone   Notes  [facility] Medication [facility] [place] [place], [place] [address] [phone] --Must be a resident of [facility] -- Must have NO insurance coverage whatsoever (no Medicaid/ Medicare, etc.) -- The pt. MUST have a primary care doctor that directs their care regularly and follows them in the community   MedAssist  [phone]   [employer]  [phone]    Agencies that provide inexpensive medical care: Organization         Address  Phone   Notes  [place]  [phone]   [facility]    [phone]   [facility] [place], [place] [address] [phone]   [place] [address] [place] [phone]   Planned Parenthood    [phone]   [facility]    [phone]   [place] and [place] [address], [place] Phone:  [phone], Fax:  [phone] Hours of Operation:  9 am - 6 pm, M-F.  Also accepts Medicaid/Medicare and self-pay.  [facility] for [place], [address], [place] Phone: [phone], Fax: [phone]. Hours of Operation:  8:30 am - 5:30 pm, M-F.  Also accepts Medicaid and self-pay.  [facility] [place] [address], [place] Phone: [phone]   [place], [place], [place] [phone], Ext. [phone] Mondays & Thursdays: 7-9 AM.  First 15 patients are seen on a first come, first serve basis.    [place] Providers:  Organization          Address  Phone   Notes  [facility] [address], [place] [phone] Also accepts self-pay patients.  [facility] [address], [place]  [phone]   [place], [address], [place] [phone]   [facility] [address], [place] [phone]   Lucianne Lei [address], [place]   [phone] Only accepts [place] Access [place] patients after they have their name applied to their card.   Self-Pay (no insurance) in [facility]:  Organization         Address  Phone   Notes  Sickle Cell Patients, [facility] [place] [phone]   [facility] Urgent Care [place] [phone]   [facility] [place]  [place], [place], [place] [phone]   [facility]/Dr. Osei-Bonsu  [address] [place] or [address], [place] [phone] Phone number for both [place] and [place] locations is the same.  Urgent Medical and [facility] [address], [place] [phone]   [facility] [address], [place] or [address] [phone]   [facility] [address] [place] [phone], phone; [phone], fax Sees patients 1st and 3rd Saturday of every month.  Must not qualify for public or private insurance (i.e. Medicaid, Medicare, [place] Health Choice, Veterans' Benefits)  Household income should be no more than 200% of the poverty level The clinic cannot treat you if you are pregnant or think you are pregnant  Sexually transmitted diseases are not treated at the clinic.    Dental Care: Organization         Address  Phone  Notes  [facility] Department of [facility] [place] [phone] Accepts children up to age 40 who are enrolled in [place] or [place]; pregnant women with a Medicaid card; and children who have applied for Medicaid or [place] Health Choice, but were declined, whose parents can pay a reduced fee at time of service.  [facility] Department of [facility]  [address], [place] [phone] Accepts children up to age 10 who are enrolled in [place] or [place]; pregnant women with a Medicaid card; and children who have applied for Medicaid or [place] Health Choice, but were declined, whose parents can pay a reduced fee at time of service.  [place] Adult Dental Access PROGRAM  [place] [phone] Patients are seen by appointment only. Walk-ins are not accepted. [place] will see patients 12 years of age and older. Monday - Tuesday (  8am-5pm) Most Wednesdays (8:30-5pm) $30 per visit, cash only  [facility] Adult Dental Access PROGRAM  [address], [facility] [phone] Patients are seen by appointment only. Walk-ins are not accepted. [place] will see patients 35 years of age and older. One Wednesday Evening (Monthly: Volunteer Based).  $30 per visit, cash only  [place]  [phone] for adults; Children under age 38, call Graduate Pediatric Dentistry at [phone]. Children aged 78-14, please call [phone] to request a pediatric application.  Dental services are provided in all areas of dental care including fillings, crowns and bridges, complete and partial dentures, implants, gum treatment, root canals, and extractions. Preventive care is also provided. Treatment is provided to both adults and children. Patients are selected via a lottery and there is often a waiting list.   [facility] [address] [place]  [phone] [email or web address]   [facility] [address] [place], [place]  [phone], Ext. [phone] Second and Fourth Thursday of each month, opens at 6:30 AM; Clinic ends at 9 AM.  Patients are seen on a first-come first-served basis, and a limited number are seen during each clinic.   [facility]  [address] Hillard Danker [place], [place] [phone]   Eligibility Requirements You must have lived in [place], [place], or [place] counties for at least the last three months.   You cannot be eligible for state or federal sponsored [employer], including [employer], [place], or [employer].   You generally cannot be eligible for healthcare insurance through your employer.    How to apply: Eligibility screenings are held every Tuesday and Wednesday afternoon from 1:00 pm until 4:00 pm. You do not need an appointment for the interview!  [facility] [address] [place], [place]   [place]  [place] Department  [place]  [phone]    Behavioral Health Resources in the Community: Intensive Outpatient Programs Organization         Address  Phone  Notes  [place]. [address] [place], [place] [phone]   [facility] [address] [place], [place]   ADS: Alcohol & Drug Svcs [address], [place], [place]   [place] [address]  [place], [place] or [phone]   Substance Abuse Resources Organization         Address  Phone  Notes  Alcohol and Drug Services  [phone]   [place]  [phone]   [facility] [place]   [place]  [phone]   Residential & Outpatient Substance Abuse Program  [phone]   Psychological Services Organization         Address  Phone  Notes  [facility] [place]  [place]  [phone]    [place] [address], [place] or [phone]    Mobile Crisis Teams Organization         Address  Phone  Notes  [facility], Mobile Crisis Care Unit  [phone]   [facility]  [address] [place], [place]   [place] [address] [place] [phone]    Self-Help/Support Groups Organization         Address  Phone  Notes  [facility]ental [facility]oc[facility] of [place] - variety of support groups  [place] Call for more information  Narcotics Anonymous (NA), [facility]aring [facility] [address], [employer] [place]  2 meetings at this location   Special educational needs teacher         Address  Phone  Notes  [facility]idential [facility] [place],    [place]  [phone]   [facility]  [address], [place] [phone], [place], [place]   [place], [place] [phone] Admissions: 8am-3pm M-F  Incentives Substance [place] [address],    [place], [place] [phone]   [facility] [address] [place], [place], [place]   [facility] [address]  [place], [place]   [facility] Programs - Intensive Outpatient [address] Kristeen [address], [place], [place]   [facility] ([place].,  [place], [place] [phone] or [phone]   [facility] Services (RTS) [address] [place], [place] Accepts Medicaid  [facility] [address],  [place] [phone] Substance Abuse/Addiction Treatment   [facility] Organization         Address  Phone  Notes  [facility]  [phone]   Domenic Schwab, PhD [address] Arlis [place], [place]   [phone] or [phone]   [place]  [place], [place] [phone]   [facility] [address], [place], [place] [phone] Insurance/Medicaid/sponsorship through [facility] and Families [address], [address] [place]                                    [place], [place] [phone] [place] [address], [place] [phone]    Dr. Adele Schilder  [phone]   [facility] Dept. 1) [address] [place] 2) [place] 3)  [place] [address], [place] [phone]  [phone]   [place] [phone] or [phone] (After Hours)

## 2015-03-29 ENCOUNTER — Ambulatory Visit: Payer: Medicare Other | Admitting: Internal Medicine

## 2015-03-29 ENCOUNTER — Encounter: Payer: Self-pay | Admitting: Internal Medicine

## 2015-04-19 IMAGING — CR DG SHOULDER 2+V*L*
4 series · 4 of 4 positions shown · non-contrast
Comparison: None.

CLINICAL DATA: Left shoulder injury with pain.

LEFT SHOULDER - 2+ VIEW

[w shoulder internal left]
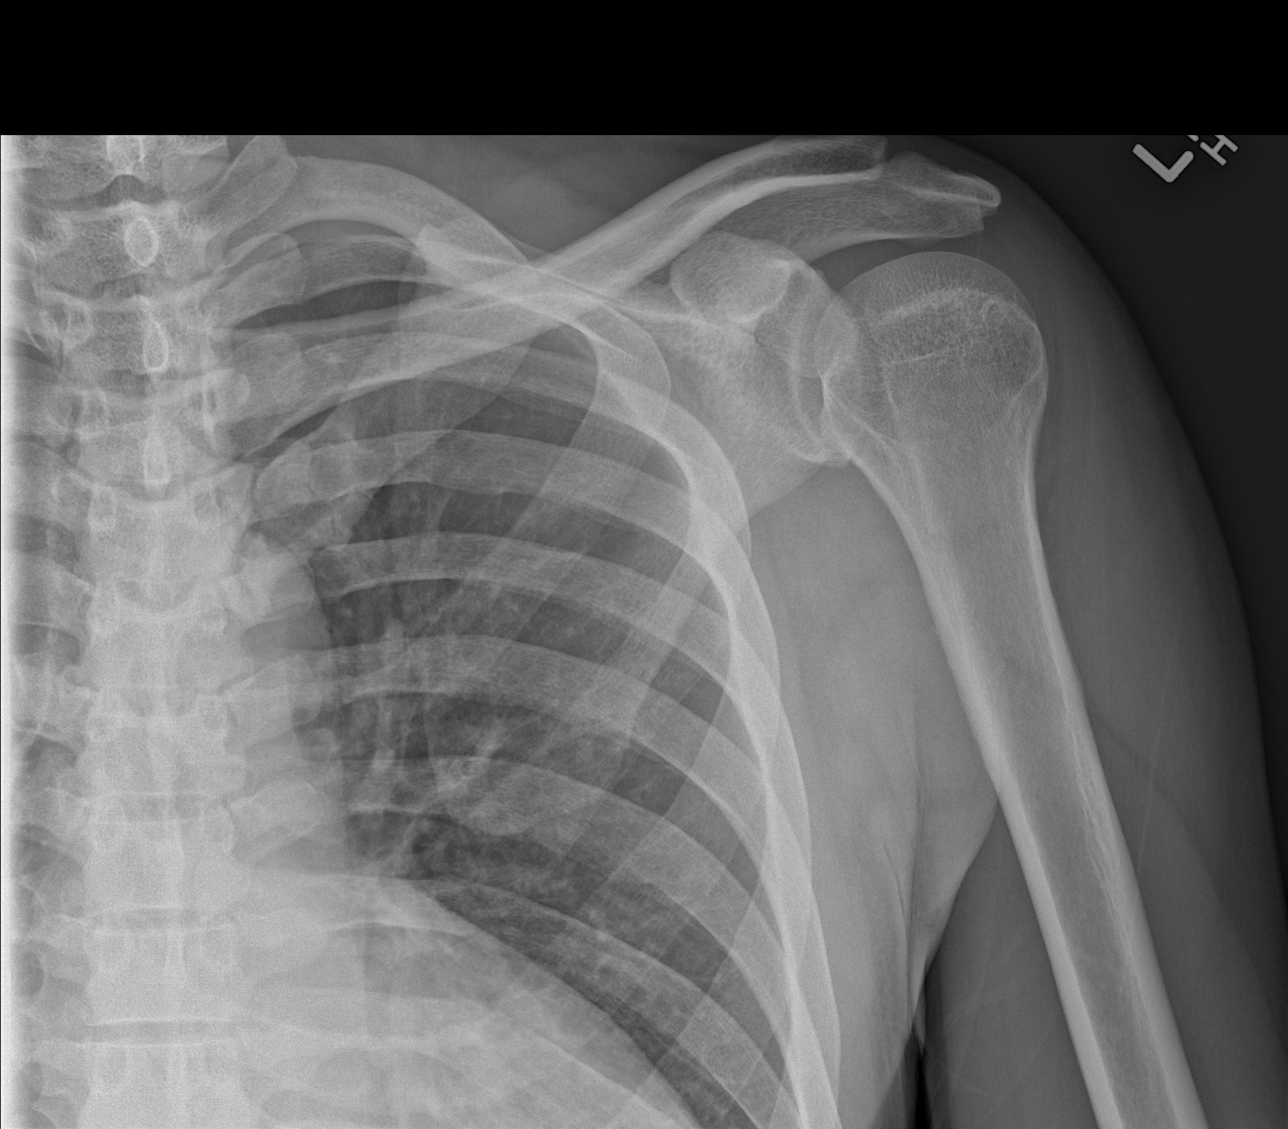

[w shoulder external left]
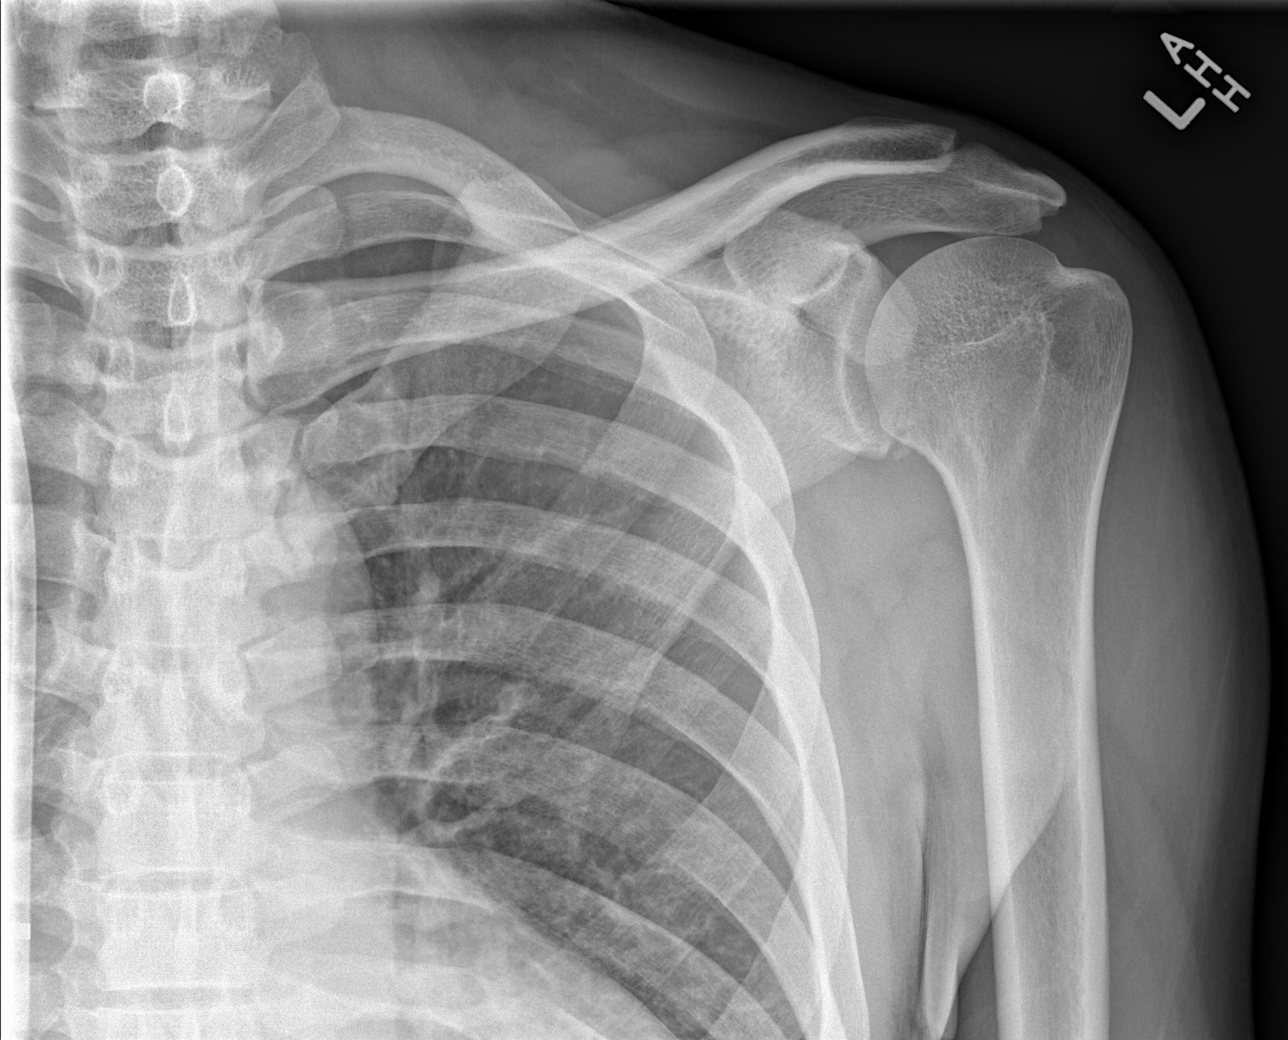

[w shoulder y-view left (1 of 2)]
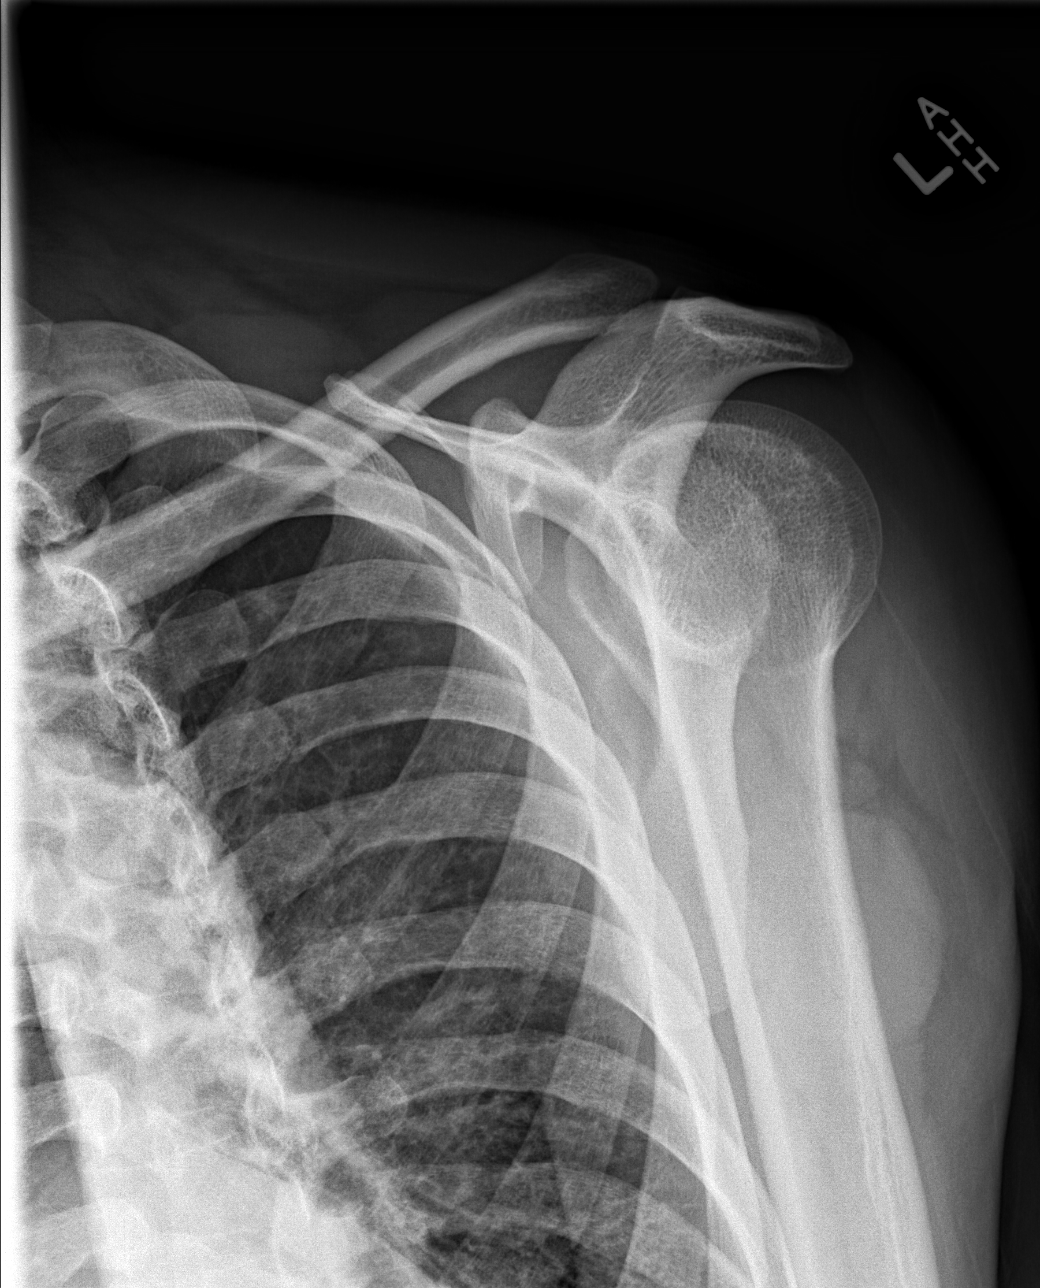

[w shoulder y-view left (2 of 2)]
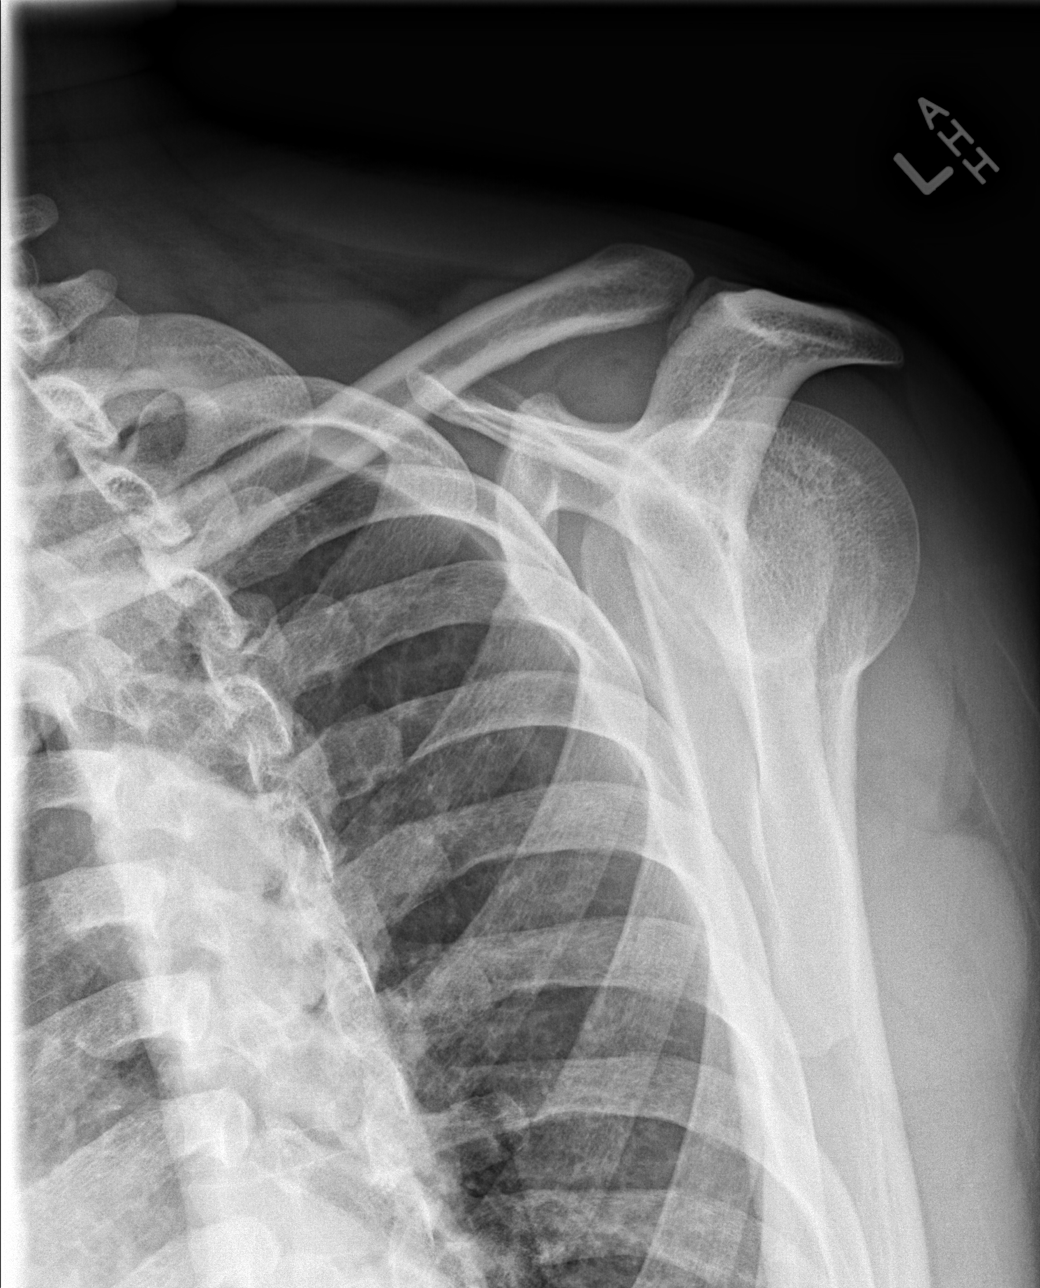

[4 of 4 positions shown; findings below may reference images not displayed]

FINDINGS: No acute fracture or dislocation is identified.  No
significant degenerative changes.  No bony lesions are identified.
Soft tissues are unremarkable.
IMPRESSION: Normal left shoulder.

## 2015-05-09 ENCOUNTER — Emergency Department (HOSPITAL_COMMUNITY): Payer: Medicare Other

## 2015-05-09 ENCOUNTER — Encounter (HOSPITAL_COMMUNITY): Payer: Self-pay | Admitting: Vascular Surgery

## 2015-05-09 ENCOUNTER — Emergency Department (HOSPITAL_COMMUNITY)
Admission: EM | Admit: 2015-05-09 | Discharge: 2015-05-09 | Disposition: A | Discharge status: Home / Self Care | Payer: Medicare Other | Attending: Emergency Medicine | Admitting: Emergency Medicine

## 2015-05-09 DIAGNOSIS — F329 Major depressive disorder, single episode, unspecified: Secondary | ICD-10-CM | POA: Diagnosis not present

## 2015-05-09 DIAGNOSIS — Z87891 Personal history of nicotine dependence: Secondary | ICD-10-CM | POA: Insufficient documentation

## 2015-05-09 DIAGNOSIS — N451 Epididymitis: Secondary | ICD-10-CM | POA: Diagnosis not present

## 2015-05-09 DIAGNOSIS — I509 Heart failure, unspecified: Secondary | ICD-10-CM | POA: Insufficient documentation

## 2015-05-09 DIAGNOSIS — Z992 Dependence on renal dialysis: Secondary | ICD-10-CM | POA: Insufficient documentation

## 2015-05-09 DIAGNOSIS — N50819 Testicular pain, unspecified: Secondary | ICD-10-CM | POA: Diagnosis present

## 2015-05-09 DIAGNOSIS — I129 Hypertensive chronic kidney disease with stage 1 through stage 4 chronic kidney disease, or unspecified chronic kidney disease: Secondary | ICD-10-CM | POA: Insufficient documentation

## 2015-05-09 DIAGNOSIS — Z7901 Long term (current) use of anticoagulants: Secondary | ICD-10-CM | POA: Diagnosis not present

## 2015-05-09 DIAGNOSIS — E119 Type 2 diabetes mellitus without complications: Secondary | ICD-10-CM | POA: Diagnosis not present

## 2015-05-09 DIAGNOSIS — Z862 Personal history of diseases of the blood and blood-forming organs and certain disorders involving the immune mechanism: Secondary | ICD-10-CM | POA: Diagnosis not present

## 2015-05-09 DIAGNOSIS — Z79899 Other long term (current) drug therapy: Secondary | ICD-10-CM | POA: Diagnosis not present

## 2015-05-09 DIAGNOSIS — N189 Chronic kidney disease, unspecified: Secondary | ICD-10-CM | POA: Insufficient documentation

## 2015-05-09 DIAGNOSIS — F419 Anxiety disorder, unspecified: Secondary | ICD-10-CM | POA: Insufficient documentation

## 2015-05-09 MED ORDER — ONDANSETRON 4 MG PO TBDP
4.0000 mg | ORAL_TABLET | Freq: Once | ORAL | Status: AC
  Administered 2015-05-09: 4 mg via ORAL
  Filled 2015-05-09: qty 1

## 2015-05-09 MED ORDER — LEVOFLOXACIN 500 MG PO TABS: 500.0000 mg | ORAL_TABLET | Freq: Every day | ORAL | Status: DC

## 2015-05-09 MED ORDER — OXYCODONE-ACETAMINOPHEN 5-325 MG PO TABS: 1.0000 | ORAL_TABLET | Freq: Four times a day (QID) | ORAL | Status: DC | PRN

## 2015-05-09 MED ORDER — OXYCODONE-ACETAMINOPHEN 5-325 MG PO TABS
1.0000 | ORAL_TABLET | Freq: Once | ORAL | Status: AC
Start: 2015-05-09 — End: 2015-05-09
  Administered 2015-05-09: 1 via ORAL
  Filled 2015-05-09: qty 1

## 2015-05-09 NOTE — Discharge Instructions (Signed)
Epididymitis Epididymitis is swelling (inflammation) of the epididymis. The epididymis is a cord-like structure that is located along the top and back part of the testicle. It collects and stores sperm from the testicle. This condition can also cause pain and swelling of the testicle and scrotum. Symptoms usually start suddenly (acute epididymitis). Sometimes epididymitis starts gradually and lasts for a while (chronic epididymitis). This type may be harder to treat. CAUSES In men 47 and younger, this condition is usually caused by a bacterial infection or sexually transmitted disease (STD), such as:  Gonorrhea.  Chlamydia.  In men 76 and older who do not have anal sex, this condition is usually caused by bacteria from a blockage or abnormalities in the urinary system. These can result from:  Having a tube placed into the bladder (urinary catheter).  Having an enlarged or inflamed prostate gland.  Having recent urinary tract surgery. In men who have a condition that weakens the body's defense system (immune system), such as HIV, this condition can be caused by:   Other bacteria, including tuberculosis and syphilis.  Viruses.  Fungi. Sometimes this condition occurs without infection. That may happen if urine flows backward into the epididymis after heavy lifting or straining. RISK FACTORS This condition is more likely to develop in men:  Who have unprotected sex with more than one partner.  Who have anal sex.   Who have recently had surgery.   Who have a urinary catheter.  Who have urinary problems.  Who have a suppressed immune system. SYMPTOMS  This condition usually begins suddenly with chills, fever, and pain behind the scrotum and in the testicle. Other symptoms include:   Swelling of the scrotum, testicle, or both.  Pain whenejaculatingor urinating.  Pain in the back or belly.  Nausea.  Itching and discharge from the penis.  Frequent need to pass  urine.  Redness and tenderness of the scrotum. DIAGNOSIS Your health care provider can diagnose this condition based on your symptoms and medical history. Your health care provider will also do a physical exam to ask about your symptoms and check your scrotum and testicle for swelling, pain, and redness. You may also have other tests, including:   Examination of discharge from the penis.  Urine tests for infections, such as STDs.  Your health care provider may test you for other STDs, including HIV. TREATMENT Treatment for this condition depends on the cause. If your condition is caused by a bacterial infection, oral antibiotic medicine may be prescribed. If the bacterial infection has spread to your blood, you may need to receive IV antibiotics. Nonbacterial epididymitis is treated with home care that includes bed rest and elevation of the scrotum. Surgery may be needed to treat:  Bacterial epididymitis that causes pus to build up in the scrotum (abscess).  Chronic epididymitis that has not responded to other treatments. HOME CARE INSTRUCTIONS Medicines  Take over-the-counter and prescription medicines only as told by your health care provider.   If you were prescribed an antibiotic medicine, take it as told by your health care provider. Do not stop taking the antibiotic even if your condition improves. Sexual Activity  If your epididymitis was caused by an STD, avoid sexual activity until your treatment is complete.  Inform your sexual partner or partners if you test positive for an STD. They may need to be treated.Do not engage in sexual activity with your partner or partners until their treatment is completed. General Instructions  Return to your normal activities as told  by your health care provider. Ask your health care provider what activities are safe for you.  Keep your scrotum elevated and supported while resting. Ask your health care provider if you should wear a  scrotal support, such as a jockstrap. Wear it as told by your health care provider.  If directed, apply ice to the affected area:   Put ice in a plastic bag.  Place a towel between your skin and the bag.  Leave the ice on for 20 minutes, 2-3 times per day.  Try taking a sitz bath to help with discomfort. This is a warm water bath that is taken while you are sitting down. The water should only come up to your hips and should cover your buttocks. Do this 3-4 times per day or as told by your health care provider.  Keep all follow-up visits as told by your health care provider. This is important. SEEK MEDICAL CARE IF:   You have a fever.   Your pain medicine is not helping.   Your pain is getting worse.   Your symptoms do not improve within three days.   This information is not intended to replace advice given to you by your health care provider. Make sure you discuss any questions you have with your health care provider.   Document Released: 07/05/2000 Document Revised: 03/29/2015 Document Reviewed: 11/23/2014 Elsevier Interactive Patient Education [employer].

## 2015-05-09 NOTE — ED Provider Notes (Signed)
CSN: [id number]     Arrival date & time 05/09/15  2051 History   First MD Initiated Contact with Patient 05/09/15 2203     Chief Complaint  Patient presents with  . Testicle Pain     (Consider location/radiation/quality/duration/timing/severity/associated sxs/prior Treatment) HPI 49 year old male who presents with bilateral testicular pain. History of diabetes, end-stage renal disease on hemodialysis, CHF, hypertension, and hyperlipidemia. States one week of scrotal swelling, which she has had before in the past in the setting of fluid overload. Reports that he went to dialysis yesterday, and although the swelling decrease he began to develop bilateral testicular pain. Has not had any abnormal penile discharge, fevers, chills. Does not make any urine at baseline. Denies abdominal pain, nausea, vomiting, or diarrhea. No overlying skin changes involving his scrotum.   Past Medical History  Diagnosis Date  . Hypertension   . Depression   . Complication of anesthesia     itching, sore throat  . Diabetes mellitus without complication ([place])     No history per patient, but remains under history as A1c would not be accurate given on dialysis  . Shortness of breath   . Anxiety   . ESRD (end stage renal disease) ([place])     due to HTN per patient, followed at [facility], s/p failed kidney transplant - dialysis Tue, Th, Sat  . Renal insufficiency   . CHF (congestive heart failure) ([place])   . Anemia   . Dialysis patient [facility])    Past Surgical History  Procedure Laterality Date  . Kidney receipient  2006    failed and started HD in March 2014  . Capd insertion    . Capd removal    . Left heart catheterization with coronary angiogram N/A 09/02/2014    Procedure: LEFT HEART CATHETERIZATION WITH CORONARY ANGIOGRAM;  Surgeon: [name], MD;  Location: [facility] CATH LAB;  Service: Cardiovascular;  Laterality: N/A;  . Inguinal hernia repair Right 02/14/2015    Procedure: REPAIR INCARCERATED RIGHT  INGUINAL HERNIA;  Surgeon: [name], MD;  Location: [place];  Service: General;  Laterality: Right;   No family history on file. Social History  Substance Use Topics  . Smoking status: Former Smoker -- 1.00 packs/day for 1 years    Types: Cigarettes  . Smokeless tobacco: Never Used     Comment: quit Jan 2014  . Alcohol Use: No    Review of Systems 10/14 systems reviewed and are negative other than those stated in the HPI   Allergies  Ferrlecit and Darvocet  Home Medications   Prior to Admission medications   Medication Sig Start Date End Date Taking? Authorizing Provider  ALPRAZolam [name]) 0.5 MG tablet Take 0.5 mg by mouth 2 (two) times daily as needed for anxiety.    Historical Provider, MD  amLODipine (NORVASC) 10 MG tablet Take 10 mg by mouth daily.    Historical Provider, MD  atorvastatin (LIPITOR) 40 MG tablet Take 1 tablet (40 mg total) by mouth daily at 6 PM. 09/02/14   Barton Dubois, MD  carvedilol (COREG) 25 MG tablet Take 1 tablet (25 mg total) by mouth 2 (two) times daily with a meal. 10/10/14   [name], DO  cinacalcet (SENSIPAR) 30 MG tablet Take 30 mg by mouth daily.    Historical Provider, MD  cloNIDine (CATAPRES - DOSED IN MG/24 HR) 0.1 mg/24hr patch Place 0.1 mg onto the skin once a week.    Historical Provider, MD  docusate sodium (COLACE) 100 MG  capsule Take 1 capsule (100 mg total) by mouth 2 (two) times daily. 11/19/14   [name], MD  doxercalciferol (HECTOROL) 4 MCG/2ML injection Inject 1.25 mLs (2.5 mcg total) into the vein Every Tuesday,Thursday,and Saturday with dialysis. 10/10/14   [name], DO  heparin 10000 UNIT/ML injection Inject 1.96 mLs (19,600 Units total) into the skin every 12 (twelve) hours. Patient not taking: Reported on 03/20/2015 02/16/15   Eugenie Filler, MD  hydrALAZINE (APRESOLINE) 50 MG tablet Take 1 tablet (50 mg total) by mouth every 8 (eight) hours. 02/04/15   [name], MD  hydrocerin (EUCERIN) CREA Apply 1  application topically 2 (two) times daily. 02/04/15   [name], MD  levalbuterol [facility] HFA) 45 MCG/ACT inhaler Inhale 2 puffs into the lungs every 6 (six) hours as needed for wheezing or shortness of breath. 11/19/14   [name], MD  levofloxacin (LEVAQUIN) 500 MG tablet Take 1 tablet (500 mg total) by mouth daily. 03/11/15   Evelina Bucy, MD  levofloxacin (LEVAQUIN) 500 MG tablet Take 1 tablet (500 mg total) by mouth daily. 05/09/15   Forde Dandy, MD  lisinopril (PRINIVIL,ZESTRIL) 20 MG tablet Take 1 tablet (20 mg total) by mouth daily. 02/04/15   [name], MD  methocarbamol (ROBAXIN) 500 MG tablet Take 2 tablets (1,000 mg total) by mouth every 8 (eight) hours as needed for muscle spasms. 01/26/15   [name], MD  multivitamin (RENA-VIT) TABS tablet Take 1 tablet by mouth at bedtime. 02/04/15   [name], MD  omeprazole (PRILOSEC) 20 MG capsule Take 20 mg by mouth daily. 07/01/13   Historical Provider, MD  ondansetron (ZOFRAN-ODT) 4 MG disintegrating tablet Take 1 tablet (4 mg total) by mouth every 8 (eight) hours as needed for nausea or vomiting. 12/05/14   Verlee Monte, MD  oxycodone (OXY-IR) 5 MG capsule Take 1-2 capsules (5-10 mg total) by mouth every 4 (four) hours as needed for pain. 02/16/15   Eugenie Filler, MD  oxyCODONE (ROXICODONE) 5 MG immediate release tablet Take 1 tablet (5 mg total) by mouth every 4 (four) hours as needed for severe pain. 03/11/15   Evelina Bucy, MD  oxyCODONE-acetaminophen (PERCOCET/ROXICET) 5-325 MG tablet Take 1 tablet by mouth every 6 (six) hours as needed for severe pain. 05/09/15   Forde Dandy, MD  predniSONE (DELTASONE) 5 MG tablet Take 5 mg by mouth daily with breakfast.    Historical Provider, MD  sevelamer carbonate (RENVELA) 800 MG tablet Take 800-1,600 mg by mouth 3 (three) times daily with meals. 2 tabs three times daily with meals, and 1 tablet with snacks    Historical Provider, MD  warfarin (COUMADIN) 6 MG tablet Take 1 tablet (6  mg total) by mouth daily at 6 PM. Take daily until adjusted per MD 02/17/15   Eugenie Filler, MD   BP 186/116 mmHg  Pulse 79  Temp(Src) 98.3 F (36.8 C) (Oral)  Resp 18  SpO2 98% Physical Exam Physical Exam  Nursing note and vitals reviewed. Constitutional: Chronically ill-appearing, non-toxic, and in no acute distress Head: Normocephalic and atraumatic.  Mouth/Throat: Oropharynx is clear and moist.  Neck: Normal range of motion. Neck supple.  Cardiovascular: Normal rate and regular rhythm.   Pulmonary/Chest: Effort normal and breath sounds normal.  Abdominal: Soft. There is no tenderness. There is no rebound and no guarding.  GU: Normal external genitalia. Normal testicular lie. Normal bilateral cremasteric reflexes. Tender to palpation over bilateral testicles over  the epididymis. No overlying skin changes.  Musculoskeletal: Normal range of motion.  Neurological: Alert, no facial droop, fluent speech, moves all extremities symmetrically Skin: Skin is warm and dry.  Psychiatric: Cooperative  ED Course  Procedures (including critical care time) Labs Review Labs Reviewed  URINE CULTURE  URINALYSIS W MICROSCOPIC    Imaging Review US Scrotum  05/09/2015  CLINICAL DATA:  Acute onset of right testicular pain. Initial encounter. EXAM: SCROTAL ULTRASOUND DOPPLER ULTRASOUND OF THE TESTICLES TECHNIQUE: Complete ultrasound examination of the testicles, epididymis, and other scrotal structures was performed. Color and spectral Doppler ultrasound were also utilized to evaluate blood flow to the testicles. COMPARISON:  Scrotal ultrasound performed 03/10/2015, and CT of the abdomen and pelvis from 03/11/2015 FINDINGS: Right testicle Measurements: 3.7 x 2.8 x 2.9 cm. No mass or microlithiasis visualized. Left testicle Measurements: 4.1 x 1.8 x 2.6 cm. No mass or microlithiasis visualized. Right epididymis: Diffuse hyperemia and mild enlargement raises concern for epididymitis. Left epididymis:  Diffuse hyperemia and mild enlargement raises concern for epididymitis. Hydrocele:  None visualized. Varicocele:  None visualized. Pulsed Doppler interrogation of both testes demonstrates normal low resistance arterial and venous waveforms bilaterally. IMPRESSION: No evidence for testicular torsion. Suspect bilateral epididymitis, with diffuse hyperemia and mild enlargement of the epididymides, and associated severe tenderness. Electronically Signed   By: Garald Balding M.D.   On: 05/09/2015 22:01   [place] Art/ven Flow Abd Pelv Doppler  05/09/2015  CLINICAL DATA:  Acute onset of right testicular pain. Initial encounter. EXAM: SCROTAL ULTRASOUND DOPPLER ULTRASOUND OF THE TESTICLES TECHNIQUE: Complete ultrasound examination of the testicles, epididymis, and other scrotal structures was performed. Color and spectral Doppler ultrasound were also utilized to evaluate blood flow to the testicles. COMPARISON:  Scrotal ultrasound performed 03/10/2015, and CT of the abdomen and pelvis from 03/11/2015 FINDINGS: Right testicle Measurements: 3.7 x 2.8 x 2.9 cm. No mass or microlithiasis visualized. Left testicle Measurements: 4.1 x 1.8 x 2.6 cm. No mass or microlithiasis visualized. Right epididymis: Diffuse hyperemia and mild enlargement raises concern for epididymitis. Left epididymis: Diffuse hyperemia and mild enlargement raises concern for epididymitis. Hydrocele:  None visualized. Varicocele:  None visualized. Pulsed Doppler interrogation of both testes demonstrates normal low resistance arterial and venous waveforms bilaterally. IMPRESSION: No evidence for testicular torsion. Suspect bilateral epididymitis, with diffuse hyperemia and mild enlargement of the epididymides, and associated severe tenderness. Electronically Signed   By: Garald Balding M.D.   On: 05/09/2015 22:01   I have personally reviewed and evaluated these images and lab results as part of my medical decision-making.   MDM   Final diagnoses:   Epididymitis     49 year old male with history of diabetes, end-stage renal disease on hemodialysis, and CHF, who presents with 2 days of bilateral testicular pain. Well-appearing and in no acute distress on presentation. Vital signs are non-concerning. Abdomen is soft and benign. On GU exam, he has normal external genitalia, but bilateral testicular tenderness to palpation over the epididymis. There is no abnormal lie and he has normal cremasteric reflexes. Unable to make any urine given his end-stage renal disease. Evidence of bilateral epididymitis on ultrasound. Denies rectal pain or pain with defecation. Do not think there is associating prostatitis. Given a course of Levaquin for treatment, will follow-up closely with his primary care doctor for reevaluation. Strict return and follow-up instructions are reviewed. He states understanding of all discharge instructions for comfortable to plan of care.   Forde Dandy, MD 05/10/15 [phone]

## 2015-05-09 NOTE — ED Notes (Addendum)
Pt reports to the ED for eval of testicle pain x 2 days. Pain unilateral to the right side. Sudden onset. Reports nausea denies any vomiting. He had a hernia surgery approx 5 weeks ago and still has the sutures in place. Also reports some swelling to the testicles. He went to dialysis and had some fluid drawn off but that did not help swelling or pain.  Denies any issues. Last dialysis today. Had to stop early r/t the pain. Denies any CP. Does not urinate anymore r/t dialysis. Able to still have BMs.Reports some SOB. Pt A&Ox4, resp e/u, and skin warm and dry.

## 2015-05-21 ENCOUNTER — Encounter (HOSPITAL_COMMUNITY): Payer: Self-pay | Admitting: Emergency Medicine

## 2015-05-21 ENCOUNTER — Emergency Department (HOSPITAL_COMMUNITY)
Admission: EM | Admit: 2015-05-21 | Discharge: 2015-05-21 | Disposition: A | Discharge status: Home / Self Care | Payer: Medicare Other | Attending: Emergency Medicine | Admitting: Emergency Medicine

## 2015-05-21 ENCOUNTER — Emergency Department (HOSPITAL_COMMUNITY): Payer: Medicare Other

## 2015-05-21 DIAGNOSIS — Z7952 Long term (current) use of systemic steroids: Secondary | ICD-10-CM | POA: Insufficient documentation

## 2015-05-21 DIAGNOSIS — Z79899 Other long term (current) drug therapy: Secondary | ICD-10-CM | POA: Insufficient documentation

## 2015-05-21 DIAGNOSIS — F419 Anxiety disorder, unspecified: Secondary | ICD-10-CM | POA: Diagnosis not present

## 2015-05-21 DIAGNOSIS — F329 Major depressive disorder, single episode, unspecified: Secondary | ICD-10-CM | POA: Diagnosis not present

## 2015-05-21 DIAGNOSIS — N186 End stage renal disease: Secondary | ICD-10-CM | POA: Insufficient documentation

## 2015-05-21 DIAGNOSIS — N50811 Right testicular pain: Secondary | ICD-10-CM | POA: Diagnosis not present

## 2015-05-21 DIAGNOSIS — I120 Hypertensive chronic kidney disease with stage 5 chronic kidney disease or end stage renal disease: Secondary | ICD-10-CM | POA: Diagnosis not present

## 2015-05-21 DIAGNOSIS — Z992 Dependence on renal dialysis: Secondary | ICD-10-CM | POA: Insufficient documentation

## 2015-05-21 DIAGNOSIS — R52 Pain, unspecified: Secondary | ICD-10-CM

## 2015-05-21 DIAGNOSIS — Z7901 Long term (current) use of anticoagulants: Secondary | ICD-10-CM | POA: Insufficient documentation

## 2015-05-21 DIAGNOSIS — I509 Heart failure, unspecified: Secondary | ICD-10-CM | POA: Insufficient documentation

## 2015-05-21 DIAGNOSIS — Z862 Personal history of diseases of the blood and blood-forming organs and certain disorders involving the immune mechanism: Secondary | ICD-10-CM | POA: Diagnosis not present

## 2015-05-21 DIAGNOSIS — E119 Type 2 diabetes mellitus without complications: Secondary | ICD-10-CM | POA: Insufficient documentation

## 2015-05-21 DIAGNOSIS — Z792 Long term (current) use of antibiotics: Secondary | ICD-10-CM | POA: Diagnosis not present

## 2015-05-21 DIAGNOSIS — R1031 Right lower quadrant pain: Secondary | ICD-10-CM | POA: Diagnosis present

## 2015-05-21 DIAGNOSIS — I10 Essential (primary) hypertension: Secondary | ICD-10-CM

## 2015-05-21 MED ORDER — LEVOFLOXACIN 500 MG PO TABS
500.0000 mg | ORAL_TABLET | Freq: Once | ORAL | Status: AC
Start: 2015-05-21 — End: 2015-05-21
  Administered 2015-05-21: 500 mg via ORAL
  Filled 2015-05-21: qty 1

## 2015-05-21 MED ORDER — OXYCODONE-ACETAMINOPHEN 5-325 MG PO TABS: 1.0000 | ORAL_TABLET | ORAL | Status: DC | PRN

## 2015-05-21 MED ORDER — LEVOFLOXACIN 500 MG PO TABS: 500.0000 mg | ORAL_TABLET | Freq: Every day | ORAL | Status: DC

## 2015-05-21 MED ORDER — HYDROMORPHONE HCL 1 MG/ML IJ SOLN
1.0000 mg | Freq: Once | INTRAMUSCULAR | Status: AC
  Administered 2015-05-21: 1 mg via INTRAMUSCULAR
  Filled 2015-05-21: qty 1

## 2015-05-21 MED ORDER — ONDANSETRON 4 MG PO TBDP
4.0000 mg | ORAL_TABLET | Freq: Once | ORAL | Status: AC
  Administered 2015-05-21: 4 mg via ORAL
  Filled 2015-05-21: qty 1

## 2015-05-21 MED ORDER — OXYCODONE-ACETAMINOPHEN 5-325 MG PO TABS
2.0000 | ORAL_TABLET | Freq: Once | ORAL | Status: AC
  Administered 2015-05-21: 2 via ORAL
  Filled 2015-05-21: qty 2

## 2015-05-21 NOTE — ED Notes (Signed)
Pt stable, ambulatory, states understanding of discharge instructions 

## 2015-05-21 NOTE — ED Notes (Signed)
Contacted ultrasound

## 2015-05-21 NOTE — ED Notes (Signed)
Pt. reports right groin pain radiating to right lower back onset yesterday , denies injury / no urinary discomfort or fever.

## 2015-05-21 NOTE — ED Provider Notes (Signed)
By signing my name below, I, Hansel Feinstein, attest that this documentation has been prepared under the direction and in the presence of [place], DO. Electronically Signed: Hansel Feinstein, ED Scribe. 05/21/2015. 4:23 AM.   TIME SEEN: 4:10 AM   CHIEF COMPLAINT:  Chief Complaint  Patient presents with  . Groin Pain    HPI:  HPI Comments: Frank [name] is a 49 y.o. male who presents to the Emergency Department complaining of moderate right groin pain onset 2 days ago. No recent injury. Pt is on dialysis since 2000 (last dialyzed today). Hx of similar symptoms with dx of epididymitis. No h/o renal calculi. Pt is not sexually active. No h/o STD. Pt is followed by a nephrologist. He denies penile discharge, left groin pain, fever, visual disturbance, numbness or tingling in the extremities, generalized weakness, chest pain or shortness of breath, headache.   ROS: See HPI Constitutional: no fever  Eyes: no drainage, visual disturbance ENT: no runny nose   Cardiovascular:  no chest pain  Resp: no SOB  GI: no vomiting GU: no dysuria, penile discharge, left groin pain. Positive for right groin pain  Integumentary: no rash  Allergy: no hives  Musculoskeletal: no leg swelling  Neurological: no slurred speech, numbness or tingling  ROS otherwise negative  PAST MEDICAL HISTORY/PAST SURGICAL HISTORY:  Past Medical History  Diagnosis Date  . Hypertension   . Depression   . Complication of anesthesia     itching, sore throat  . Diabetes mellitus without complication ([place])     No history per patient, but remains under history as A1c would not be accurate given on dialysis  . Shortness of breath   . Anxiety   . ESRD (end stage renal disease) ([place])     due to HTN per patient, followed at [facility], s/p failed kidney transplant - dialysis Tue, Th, Sat  . Renal insufficiency   . CHF (congestive heart failure) ([place])   . Anemia   . Dialysis patient [facility])     MEDICATIONS:  Prior to Admission  medications   Medication Sig Start Date End Date Taking? Authorizing Provider  ALPRAZolam [name]) 0.5 MG tablet Take 0.5 mg by mouth 2 (two) times daily as needed for anxiety.    Historical Provider, MD  amLODipine (NORVASC) 10 MG tablet Take 10 mg by mouth daily.    Historical Provider, MD  atorvastatin (LIPITOR) 40 MG tablet Take 1 tablet (40 mg total) by mouth daily at 6 PM. 09/02/14   Barton Dubois, MD  carvedilol (COREG) 25 MG tablet Take 1 tablet (25 mg total) by mouth 2 (two) times daily with a meal. 10/10/14   [name], DO  cinacalcet (SENSIPAR) 30 MG tablet Take 30 mg by mouth daily.    Historical Provider, MD  cloNIDine (CATAPRES - DOSED IN MG/24 HR) 0.1 mg/24hr patch Place 0.1 mg onto the skin once a week.    Historical Provider, MD  docusate sodium (COLACE) 100 MG capsule Take 1 capsule (100 mg total) by mouth 2 (two) times daily. 11/19/14   [name], MD  doxercalciferol (HECTOROL) 4 MCG/2ML injection Inject 1.25 mLs (2.5 mcg total) into the vein Every Tuesday,Thursday,and Saturday with dialysis. 10/10/14   [name], DO  heparin 10000 UNIT/ML injection Inject 1.96 mLs (19,600 Units total) into the skin every 12 (twelve) hours. Patient not taking: Reported on 03/20/2015 02/16/15   Eugenie Filler, MD  hydrALAZINE (APRESOLINE) 50 MG tablet Take 1 tablet (50 mg  total) by mouth every 8 (eight) hours. 02/04/15   [name], MD  hydrocerin (EUCERIN) CREA Apply 1 application topically 2 (two) times daily. 02/04/15   [name], MD  levalbuterol [facility] HFA) 45 MCG/ACT inhaler Inhale 2 puffs into the lungs every 6 (six) hours as needed for wheezing or shortness of breath. 11/19/14   [name], MD  levofloxacin (LEVAQUIN) 500 MG tablet Take 1 tablet (500 mg total) by mouth daily. 03/11/15   Evelina Bucy, MD  levofloxacin (LEVAQUIN) 500 MG tablet Take 1 tablet (500 mg total) by mouth daily. 05/09/15   Forde Dandy, MD  lisinopril (PRINIVIL,ZESTRIL) 20 MG tablet Take 1 tablet  (20 mg total) by mouth daily. 02/04/15   [name], MD  methocarbamol (ROBAXIN) 500 MG tablet Take 2 tablets (1,000 mg total) by mouth every 8 (eight) hours as needed for muscle spasms. 01/26/15   [name], MD  multivitamin (RENA-VIT) TABS tablet Take 1 tablet by mouth at bedtime. 02/04/15   [name], MD  omeprazole (PRILOSEC) 20 MG capsule Take 20 mg by mouth daily. 07/01/13   Historical Provider, MD  ondansetron (ZOFRAN-ODT) 4 MG disintegrating tablet Take 1 tablet (4 mg total) by mouth every 8 (eight) hours as needed for nausea or vomiting. 12/05/14   Verlee Monte, MD  oxycodone (OXY-IR) 5 MG capsule Take 1-2 capsules (5-10 mg total) by mouth every 4 (four) hours as needed for pain. 02/16/15   Eugenie Filler, MD  oxyCODONE (ROXICODONE) 5 MG immediate release tablet Take 1 tablet (5 mg total) by mouth every 4 (four) hours as needed for severe pain. 03/11/15   Evelina Bucy, MD  oxyCODONE-acetaminophen (PERCOCET/ROXICET) 5-325 MG tablet Take 1 tablet by mouth every 6 (six) hours as needed for severe pain. 05/09/15   Forde Dandy, MD  predniSONE (DELTASONE) 5 MG tablet Take 5 mg by mouth daily with breakfast.    Historical Provider, MD  sevelamer carbonate (RENVELA) 800 MG tablet Take 800-1,600 mg by mouth 3 (three) times daily with meals. 2 tabs three times daily with meals, and 1 tablet with snacks    Historical Provider, MD  warfarin (COUMADIN) 6 MG tablet Take 1 tablet (6 mg total) by mouth daily at 6 PM. Take daily until adjusted per MD 02/17/15   Eugenie Filler, MD    ALLERGIES:  Allergies  Allergen Reactions  . Ferrlecit [Na Ferric Gluc Cplx In Sucrose] Shortness Of Breath, Swelling and Other (See Comments)    Swelling in throat  . Darvocet [Propoxyphene N-Acetaminophen] Hives    SOCIAL HISTORY:  Social History  Substance Use Topics  . Smoking status: Former Smoker -- 0.00 packs/day for 1 years    Types: Cigarettes  . Smokeless tobacco: Never Used     Comment: quit Jan  2014  . Alcohol Use: No    FAMILY HISTORY: No family history on file.  EXAM: BP 190/126 mmHg  Pulse 65  Temp(Src) 98.6 F (37 C) (Oral)  Resp 22  SpO2 100% CONSTITUTIONAL: Alert and oriented and responds appropriately to questions. Appears uncomfortable, chronically ill-appearing HEAD: Normocephalic EYES: Conjunctivae clear, PERRL ENT: normal nose; no rhinorrhea; moist mucous membranes; pharynx without lesions noted NECK: Supple, no meningismus, no LAD  CARD: RRR; S1 and S2 appreciated; no murmurs, no clicks, no rubs, no gallops. Dialysis catheter in the right chest wall without surrounding erythema or warmth.  RESP: Normal chest excursion without splinting or tachypnea; breath sounds clear and equal bilaterally; no  wheezes, no rhonchi, no rales,  ABD/GI: Normal bowel sounds; non-distended; soft, non-tender, no rebound, no guarding GU: Circumcised male. Normal external genitalia. Right testicle TTP, especially at the epididymis without masses, scrotal swelling, erythema or warmth. No hernias appreciated. No perineal erythemal warmth, subcutaneous erythema or crepitus. No high-riding testicle. No penile discharge or blood at the urethral meatus.  BACK:  The back appears normal and is non-tender to palpation, there is no CVA tenderness EXT: Normal ROM in all joints; non-tender to palpation; no edema; normal capillary refill; no cyanosis    SKIN: Normal color for age and race; warm NEURO: Moves all extremities equally PSYCH: The patient's mood and manner are appropriate. Grooming and personal hygiene are appropriate.  MEDICAL DECISION MAKING: Patient here with testicular pain. Patient is very tender over the epididymis on the right side. Suspect epididymitis. Was recently in the emergency department on October 18 for bilateral epididymitis and discharged on Levaquin. States he finished this course but pain started again today. No injury to this area. No history of kidney stones. No  abdominal pain on exam and no flank pain. Pain is reproducible with palpation of the testicle. No high riding testicle. Patient no longer makes urine. Has been on dialysis since 2000. Last dialyzed today. He is hypertensive but this appears to be his baseline. He states he is on medication for his blood pressure. No headache, vision changes, neurologic deficits, chest pain or shortness of breath. Does not appear volume overloaded on exam.  ED PROGRESS: Ultrasound shows no evidence of testicular torsion. He has small calcification within the right testis but otherwise it is unremarkable. Also has chronic inflammatory changes of the scrotal wall more prominent on the left side. Suspect that this may be early epididymitis given this feels similar to his previous episode of epididymitis. We'll discharge him again on Levaquin and have him follow-up with urology. His pain is improved after IM Dilaudid. We'll discharge with prescription for Percocet. I doubt this is a kidney stone given pain is reproducible completely with palpation of the testicle. Unable to obtain urine as patient has been on dialysis since 2000. Discussed return precautions. He verbalizes understanding and is comfortable with this plan.  [place], DO 05/21/15 [phone]

## 2015-05-21 NOTE — Discharge Instructions (Signed)
Please follow-up with a urologist for your continued testicular pain. You also need to follow-up with a primary care physician for your elevated blood pressure. Please take your blood pressure medication every day as prescribed.   Probably Early Epididymitis Epididymitis is swelling (inflammation) of the epididymis. The epididymis is a cord-like structure that is located along the top and back part of the testicle. It collects and stores sperm from the testicle. This condition can also cause pain and swelling of the testicle and scrotum. Symptoms usually start suddenly (acute epididymitis). Sometimes epididymitis starts gradually and lasts for a while (chronic epididymitis). This type may be harder to treat. CAUSES In men 78 and younger, this condition is usually caused by a bacterial infection or sexually transmitted disease (STD), such as:  Gonorrhea.  Chlamydia.  In men 26 and older who do not have anal sex, this condition is usually caused by bacteria from a blockage or abnormalities in the urinary system. These can result from:  Having a tube placed into the bladder (urinary catheter).  Having an enlarged or inflamed prostate gland.  Having recent urinary tract surgery. In men who have a condition that weakens the body's defense system (immune system), such as HIV, this condition can be caused by:   Other bacteria, including tuberculosis and syphilis.  Viruses.  Fungi. Sometimes this condition occurs without infection. That may happen if urine flows backward into the epididymis after heavy lifting or straining. RISK FACTORS This condition is more likely to develop in men:  Who have unprotected sex with more than one partner.  Who have anal sex.   Who have recently had surgery.   Who have a urinary catheter.  Who have urinary problems.  Who have a suppressed immune system. SYMPTOMS  This condition usually begins suddenly with chills, fever, and pain behind the  scrotum and in the testicle. Other symptoms include:   Swelling of the scrotum, testicle, or both.  Pain whenejaculatingor urinating.  Pain in the back or belly.  Nausea.  Itching and discharge from the penis.  Frequent need to pass urine.  Redness and tenderness of the scrotum. DIAGNOSIS Your health care provider can diagnose this condition based on your symptoms and medical history. Your health care provider will also do a physical exam to ask about your symptoms and check your scrotum and testicle for swelling, pain, and redness. You may also have other tests, including:   Examination of discharge from the penis.  Urine tests for infections, such as STDs.  Your health care provider may test you for other STDs, including HIV. TREATMENT Treatment for this condition depends on the cause. If your condition is caused by a bacterial infection, oral antibiotic medicine may be prescribed. If the bacterial infection has spread to your blood, you may need to receive IV antibiotics. Nonbacterial epididymitis is treated with home care that includes bed rest and elevation of the scrotum. Surgery may be needed to treat:  Bacterial epididymitis that causes pus to build up in the scrotum (abscess).  Chronic epididymitis that has not responded to other treatments. HOME CARE INSTRUCTIONS Medicines  Take over-the-counter and prescription medicines only as told by your health care provider.   If you were prescribed an antibiotic medicine, take it as told by your health care provider. Do not stop taking the antibiotic even if your condition improves. Sexual Activity  If your epididymitis was caused by an STD, avoid sexual activity until your treatment is complete.  Inform your sexual partner or  partners if you test positive for an STD. They may need to be treated.Do not engage in sexual activity with your partner or partners until their treatment is completed. General  Instructions  Return to your normal activities as told by your health care provider. Ask your health care provider what activities are safe for you.  Keep your scrotum elevated and supported while resting. Ask your health care provider if you should wear a scrotal support, such as a jockstrap. Wear it as told by your health care provider.  If directed, apply ice to the affected area:   Put ice in a plastic bag.  Place a towel between your skin and the bag.  Leave the ice on for 20 minutes, 2-3 times per day.  Try taking a sitz bath to help with discomfort. This is a warm water bath that is taken while you are sitting down. The water should only come up to your hips and should cover your buttocks. Do this 3-4 times per day or as told by your health care provider.  Keep all follow-up visits as told by your health care provider. This is important. SEEK MEDICAL CARE IF:   You have a fever.   Your pain medicine is not helping.   Your pain is getting worse.   Your symptoms do not improve within three days.   This information is not intended to replace advice given to you by your health care provider. Make sure you discuss any questions you have with your health care provider.   Document Released: 07/05/2000 Document Revised: 03/29/2015 Document Reviewed: 11/23/2014 Elsevier Interactive Patient Education 2016 [employer].   Hypertension Hypertension, commonly called high blood pressure, is when the force of blood pumping through your arteries is too strong. Your arteries are the blood vessels that carry blood from your heart throughout your body. A blood pressure reading consists of a higher number over a lower number, such as 110/72. The higher number (systolic) is the pressure inside your arteries when your heart pumps. The lower number (diastolic) is the pressure inside your arteries when your heart relaxes. Ideally you want your blood pressure below 120/80. Hypertension forces  your heart to work harder to pump blood. Your arteries may become narrow or stiff. Having untreated or uncontrolled hypertension can cause heart attack, stroke, kidney disease, and other problems. RISK FACTORS Some risk factors for high blood pressure are controllable. Others are not.  Risk factors you cannot control include:   Race. You may be at higher risk if you are African American.  Age. Risk increases with age.  Gender. Men are at higher risk than women before age 85 years. After age 53, women are at higher risk than men. Risk factors you can control include:  Not getting enough exercise or physical activity.  Being overweight.  Getting too much fat, sugar, calories, or salt in your diet.  Drinking too much alcohol. SIGNS AND SYMPTOMS Hypertension does not usually cause signs or symptoms. Extremely high blood pressure (hypertensive crisis) may cause headache, anxiety, shortness of breath, and nosebleed. DIAGNOSIS To check if you have hypertension, your health care provider will measure your blood pressure while you are seated, with your arm held at the level of your heart. It should be measured at least twice using the same arm. Certain conditions can cause a difference in blood pressure between your right and left arms. A blood pressure reading that is higher than normal on one occasion does not mean that you need  treatment. If it is not clear whether you have high blood pressure, you may be asked to return on a different day to have your blood pressure checked again. Or, you may be asked to monitor your blood pressure at home for 1 or more weeks. TREATMENT Treating high blood pressure includes making lifestyle changes and possibly taking medicine. Living a healthy lifestyle can help lower high blood pressure. You may need to change some of your habits. Lifestyle changes may include:  Following the DASH diet. This diet is high in fruits, vegetables, and whole grains. It is low in  salt, red meat, and added sugars.  Keep your sodium intake below 2,300 mg per day.  Getting at least 30-45 minutes of aerobic exercise at least 4 times per week.  Losing weight if necessary.  Not smoking.  Limiting alcoholic beverages.  Learning ways to reduce stress. Your health care provider may prescribe medicine if lifestyle changes are not enough to get your blood pressure under control, and if one of the following is true:  You are 48-31 years of age and your systolic blood pressure is above 140.  You are 59 years of age or older, and your systolic blood pressure is above 150.  Your diastolic blood pressure is above 90.  You have diabetes, and your systolic blood pressure is over 947 or your diastolic blood pressure is over 90.  You have kidney disease and your blood pressure is above 140/90.  You have heart disease and your blood pressure is above 140/90. Your personal target blood pressure may vary depending on your medical conditions, your age, and other factors. HOME CARE INSTRUCTIONS  Have your blood pressure rechecked as directed by your health care provider.   Take medicines only as directed by your health care provider. Follow the directions carefully. Blood pressure medicines must be taken as prescribed. The medicine does not work as well when you skip doses. Skipping doses also puts you at risk for problems.  Do not smoke.   Monitor your blood pressure at home as directed by your health care provider. SEEK MEDICAL CARE IF:   You think you are having a reaction to medicines taken.  You have recurrent headaches or feel dizzy.  You have swelling in your ankles.  You have trouble with your vision. SEEK IMMEDIATE MEDICAL CARE IF:  You develop a severe headache or confusion.  You have unusual weakness, numbness, or feel faint.  You have severe chest or abdominal pain.  You vomit repeatedly.  You have trouble breathing. MAKE SURE YOU:   Understand  these instructions.  Will watch your condition.  Will get help right away if you are not doing well or get worse.   This information is not intended to replace advice given to you by your health care provider. Make sure you discuss any questions you have with your health care provider.   Document Released: 07/08/2005 Document Revised: 11/22/2014 Document Reviewed: 04/30/2013 Elsevier Interactive Patient Education [employer].

## 2015-05-21 NOTE — ED Notes (Signed)
Patient transported to Ultrasound 

## 2015-05-30 NOTE — Progress Notes (Signed)
  This encounter was created in error - please disregard.

## 2015-06-05 ENCOUNTER — Encounter (HOSPITAL_COMMUNITY): Payer: Self-pay | Admitting: Emergency Medicine

## 2015-06-05 ENCOUNTER — Emergency Department (HOSPITAL_COMMUNITY): Payer: Medicare Other

## 2015-06-05 ENCOUNTER — Emergency Department (HOSPITAL_COMMUNITY)
Admission: EM | Admit: 2015-06-05 | Discharge: 2015-06-05 | Disposition: A | Discharge status: Home / Self Care | Payer: Medicare Other | Attending: Emergency Medicine | Admitting: Emergency Medicine

## 2015-06-05 DIAGNOSIS — N186 End stage renal disease: Secondary | ICD-10-CM | POA: Diagnosis not present

## 2015-06-05 DIAGNOSIS — Z79899 Other long term (current) drug therapy: Secondary | ICD-10-CM | POA: Insufficient documentation

## 2015-06-05 DIAGNOSIS — F419 Anxiety disorder, unspecified: Secondary | ICD-10-CM | POA: Insufficient documentation

## 2015-06-05 DIAGNOSIS — N451 Epididymitis: Secondary | ICD-10-CM

## 2015-06-05 DIAGNOSIS — I509 Heart failure, unspecified: Secondary | ICD-10-CM | POA: Diagnosis not present

## 2015-06-05 DIAGNOSIS — E119 Type 2 diabetes mellitus without complications: Secondary | ICD-10-CM | POA: Diagnosis not present

## 2015-06-05 DIAGNOSIS — Z7952 Long term (current) use of systemic steroids: Secondary | ICD-10-CM | POA: Insufficient documentation

## 2015-06-05 DIAGNOSIS — F329 Major depressive disorder, single episode, unspecified: Secondary | ICD-10-CM | POA: Insufficient documentation

## 2015-06-05 DIAGNOSIS — I120 Hypertensive chronic kidney disease with stage 5 chronic kidney disease or end stage renal disease: Secondary | ICD-10-CM | POA: Insufficient documentation

## 2015-06-05 DIAGNOSIS — Z862 Personal history of diseases of the blood and blood-forming organs and certain disorders involving the immune mechanism: Secondary | ICD-10-CM | POA: Diagnosis not present

## 2015-06-05 DIAGNOSIS — N50811 Right testicular pain: Secondary | ICD-10-CM | POA: Diagnosis present

## 2015-06-05 DIAGNOSIS — Z992 Dependence on renal dialysis: Secondary | ICD-10-CM | POA: Diagnosis not present

## 2015-06-05 LAB — CBC WITH DIFFERENTIAL/PLATELET
BASOS ABS: 0 10*3/uL (ref 0.0–0.1)
Basophils Relative: 0 %
EOS PCT: 3 %
Eosinophils Absolute: 0.2 10*3/uL (ref 0.0–0.7)
HEMATOCRIT: 23.8 % — AB (ref 39.0–52.0)
HEMOGLOBIN: 7.3 g/dL — AB (ref 13.0–17.0)
LYMPHS ABS: 0.7 10*3/uL (ref 0.7–4.0)
LYMPHS PCT: 15 %
MCH: 26.2 pg (ref 26.0–34.0)
MCHC: 30.7 g/dL (ref 30.0–36.0)
MCV: 85.3 fL (ref 78.0–100.0)
Monocytes Absolute: 0.6 10*3/uL (ref 0.1–1.0)
Monocytes Relative: 13 %
NEUTROS ABS: 3.4 10*3/uL (ref 1.7–7.7)
Neutrophils Relative %: 69 %
PLATELETS: 189 10*3/uL (ref 150–400)
RBC: 2.79 MIL/uL — AB (ref 4.22–5.81)
RDW: 17.8 % — ABNORMAL HIGH (ref 11.5–15.5)
WBC: 4.9 10*3/uL (ref 4.0–10.5)

## 2015-06-05 LAB — COMPREHENSIVE METABOLIC PANEL
ALK PHOS: 139 U/L — AB (ref 38–126)
ALT: 23 U/L (ref 17–63)
AST: 63 U/L — AB (ref 15–41)
Albumin: 2.5 g/dL — ABNORMAL LOW (ref 3.5–5.0)
Anion gap: 10 (ref 5–15)
BILIRUBIN TOTAL: 0.2 mg/dL — AB (ref 0.3–1.2)
BUN: 32 mg/dL — ABNORMAL HIGH (ref 6–20)
CALCIUM: 7.9 mg/dL — AB (ref 8.9–10.3)
CO2: 25 mmol/L (ref 22–32)
Chloride: 105 mmol/L (ref 101–111)
Creatinine, Ser: 8.43 mg/dL — ABNORMAL HIGH (ref 0.61–1.24)
GFR, EST AFRICAN AMERICAN: 8 mL/min — AB (ref >60)
GFR, EST NON AFRICAN AMERICAN: 6 mL/min — AB (ref >60)
GLUCOSE: 83 mg/dL (ref 65–99)
Potassium: 4.4 mmol/L (ref 3.5–5.1)
Sodium: 140 mmol/L (ref 135–145)
TOTAL PROTEIN: 6 g/dL — AB (ref 6.5–8.1)

## 2015-06-05 MED ORDER — HYDROMORPHONE HCL 1 MG/ML IJ SOLN
2.0000 mg | Freq: Once | INTRAMUSCULAR | Status: AC
  Administered 2015-06-05: 2 mg via INTRAMUSCULAR
  Filled 2015-06-05: qty 2

## 2015-06-05 MED ORDER — OXYCODONE-ACETAMINOPHEN 5-325 MG PO TABS: 1.0000 | ORAL_TABLET | ORAL | Status: DC | PRN

## 2015-06-05 MED ORDER — LEVOFLOXACIN 500 MG PO TABS: 500.0000 mg | ORAL_TABLET | Freq: Every day | ORAL | Status: DC

## 2015-06-05 NOTE — ED Provider Notes (Signed)
CSN: [id number]     Arrival date & time 06/05/15  0001 History   By signing my name below, I, Frank [name], attest that this documentation has been prepared under the direction and in the presence of Frank Rice, MD.  Electronically Signed: Forrestine [name], ED Scribe. 06/05/2015. 1:13 AM.   Chief Complaint  Patient presents with  . Testicle Pain   The history is provided by the patient. No language interpreter was used.    HPI Comments: Frank [name] is a 49 y.o. male with a PMHx of HTN, DM, ESRD, CHF who presents to the Emergency Department complaining of constant, ongoing bilateral testicular pain with mild associated swelling x 3-4 days. Pain is made worse when standing. No alleviating factors at this time. Mild chills also reported. No OTC medications or home remedies attempted prior to arrival. No recent fever, nausea, vomiting, or abdominal pain. [name] has been evaluated in the Emergency Department for same. Last seen on 10/30. Scheduled follow up with Urologist on 12/8. Pt is typically dialyzed on Tuesdays, Thursdays, and Saturdays with last successful treatment yesterday. Patient does not produce urine. No penile discharge or pain.  PCP: [name] Patient     Past Medical History  Diagnosis Date  . Hypertension   . Depression   . Complication of anesthesia     itching, sore throat  . Diabetes mellitus without complication ([place])     No history per patient, but remains under history as A1c would not be accurate given on dialysis  . Shortness of breath   . Anxiety   . ESRD (end stage renal disease) ([place])     due to HTN per patient, followed at [facility], s/p failed kidney transplant - dialysis Tue, Th, Sat  . Renal insufficiency   . CHF (congestive heart failure) ([place])   . Anemia   . Dialysis patient [facility])    Past Surgical History  Procedure Laterality Date  . Kidney receipient  2006    failed and started HD in March 2014  . Capd insertion    . Capd removal    . Left  heart catheterization with coronary angiogram N/A 09/02/2014    Procedure: LEFT HEART CATHETERIZATION WITH CORONARY ANGIOGRAM;  Surgeon: [name], MD;  Location: [facility] CATH LAB;  Service: Cardiovascular;  Laterality: N/A;  . Inguinal hernia repair Right 02/14/2015    Procedure: REPAIR INCARCERATED RIGHT INGUINAL HERNIA;  Surgeon: [name], MD;  Location: [place];  Service: General;  Laterality: Right;   No family history on file. Social History  Substance Use Topics  . Smoking status: Former Smoker -- 0.00 packs/day for 1 years    Types: Cigarettes  . Smokeless tobacco: Never Used     Comment: quit Jan 2014  . Alcohol Use: No    Review of Systems  Constitutional: Positive for chills. Negative for fever.  Respiratory: Negative for cough and shortness of breath.   Cardiovascular: Negative for chest pain.  Gastrointestinal: Negative for nausea, vomiting and abdominal pain.  Genitourinary: Positive for scrotal swelling and testicular pain. Negative for discharge, penile swelling and penile pain.  Musculoskeletal: Negative for back pain.  Skin: Negative for rash.  Neurological: Negative for dizziness, weakness, numbness and headaches.  Psychiatric/Behavioral: Negative for confusion.  All other systems reviewed and are negative.     Allergies  Ferrlecit and Darvocet  Home Medications   Prior to Admission medications   Medication Sig Start Date End Date Taking? Authorizing Provider  ALPRAZolam (  XANAX) 0.5 MG tablet Take 0.5 mg by mouth 2 (two) times daily as needed for anxiety.   Yes Historical Provider, MD  amLODipine (NORVASC) 10 MG tablet Take 10 mg by mouth daily.   Yes Historical Provider, MD  atorvastatin (LIPITOR) 40 MG tablet Take 1 tablet (40 mg total) by mouth daily at 6 PM. 09/02/14  Yes Barton Dubois, MD  carvedilol (COREG) 25 MG tablet Take 1 tablet (25 mg total) by mouth 2 (two) times daily with a meal. 10/10/14  Yes [name], DO  cinacalcet (SENSIPAR) 30 MG  tablet Take 30 mg by mouth daily.   Yes Historical Provider, MD  cloNIDine (CATAPRES - DOSED IN MG/24 HR) 0.1 mg/24hr patch Place 0.1 mg onto the skin once a week.   Yes Historical Provider, MD  docusate sodium (COLACE) 100 MG capsule Take 1 capsule (100 mg total) by mouth 2 (two) times daily. 11/19/14  Yes [name], MD  doxercalciferol (HECTOROL) 4 MCG/2ML injection Inject 1.25 mLs (2.5 mcg total) into the vein Every Tuesday,Thursday,and Saturday with dialysis. 10/10/14  Yes [name], DO  hydrALAZINE (APRESOLINE) 50 MG tablet Take 1 tablet (50 mg total) by mouth every 8 (eight) hours. 02/04/15  Yes [name], MD  hydrocerin (EUCERIN) CREA Apply 1 application topically 2 (two) times daily. 02/04/15  Yes [name], MD  levalbuterol [facility] HFA) 45 MCG/ACT inhaler Inhale 2 puffs into the lungs every 6 (six) hours as needed for wheezing or shortness of breath. 11/19/14  Yes [name], MD  lisinopril (PRINIVIL,ZESTRIL) 20 MG tablet Take 1 tablet (20 mg total) by mouth daily. 02/04/15  Yes [name], MD  methocarbamol (ROBAXIN) 500 MG tablet Take 2 tablets (1,000 mg total) by mouth every 8 (eight) hours as needed for muscle spasms. 01/26/15  Yes Frank Rice, MD  multivitamin (RENA-VIT) TABS tablet Take 1 tablet by mouth at bedtime. 02/04/15  Yes [name], MD  omeprazole (PRILOSEC) 20 MG capsule Take 20 mg by mouth daily. 07/01/13  Yes Historical Provider, MD  ondansetron (ZOFRAN-ODT) 4 MG disintegrating tablet Take 1 tablet (4 mg total) by mouth every 8 (eight) hours as needed for nausea or vomiting. 12/05/14  Yes Verlee Monte, MD  oxyCODONE (ROXICODONE) 5 MG immediate release tablet Take 1 tablet (5 mg total) by mouth every 4 (four) hours as needed for severe pain. 03/11/15  Yes Evelina Bucy, MD  predniSONE (DELTASONE) 5 MG tablet Take 5 mg by mouth daily with breakfast.   Yes Historical Provider, MD  sevelamer carbonate (RENVELA) 800 MG tablet Take 800-1,600 mg by mouth 3  (three) times daily with meals. 2 tabs three times daily with meals, and 1 tablet with snacks   Yes Historical Provider, MD  warfarin (COUMADIN) 6 MG tablet Take 1 tablet (6 mg total) by mouth daily at 6 PM. Take daily until adjusted per MD 02/17/15  Yes Eugenie Filler, MD  levofloxacin (LEVAQUIN) 500 MG tablet Take 1 tablet (500 mg total) by mouth daily. 06/05/15   Frank Rice, MD  oxyCODONE-acetaminophen (PERCOCET/ROXICET) 5-325 MG tablet Take 1 tablet by mouth every 4 (four) hours as needed for moderate pain. 06/05/15   Frank Rice, MD   Triage Vitals: BP 147/98 mmHg  Pulse 70  Temp(Src) 98.1 F (36.7 C) (Oral)  Resp 18  SpO2 97%   Physical Exam  Constitutional: He is oriented to person, place, and time. He appears well-developed and well-nourished. No distress.  HENT:  Head:  Normocephalic and atraumatic.  Mouth/Throat: Oropharynx is clear and moist.  Eyes: EOM are normal. Pupils are equal, round, and reactive to light.  Neck: Normal range of motion. Neck supple.  Cardiovascular: Normal rate and regular rhythm.   Pulmonary/Chest: Effort normal and breath sounds normal. No respiratory distress. He has no wheezes. He has no rales. He exhibits no tenderness.  Abdominal: Soft. Bowel sounds are normal. He exhibits distension (mild abdominal distention). He exhibits no mass. There is no tenderness. There is no rebound and no guarding.  Genitourinary:  Patient has bilateral testicular tenderness. Normal testicular lie. No masses appreciated. Uncircumcised penis. no penile discharge.  Musculoskeletal: Normal range of motion. He exhibits no edema or tenderness.  Neurological: He is alert and oriented to person, place, and time.  Skin: Skin is warm and dry. No rash noted. No erythema.  Psychiatric: He has a normal mood and affect. His behavior is normal.  Nursing note and vitals reviewed.   ED Course  Procedures (including critical care time)  DIAGNOSTIC STUDIES: Oxygen  Saturation is 100% on RA, Normal by my interpretation.    COORDINATION OF CARE: 1:06 AM-Discussed treatment plan with pt at bedside and pt agreed to plan.  a   Labs Review Labs Reviewed  CBC WITH DIFFERENTIAL/PLATELET - Abnormal; Notable for the following:    RBC 2.79 (*)    Hemoglobin 7.3 (*)    HCT 23.8 (*)    RDW 17.8 (*)    All other components within normal limits  COMPREHENSIVE METABOLIC PANEL - Abnormal; Notable for the following:    BUN 32 (*)    Creatinine, Ser 8.43 (*)    Calcium 7.9 (*)    Total Protein 6.0 (*)    Albumin 2.5 (*)    AST 63 (*)    Alkaline Phosphatase 139 (*)    Total Bilirubin 0.2 (*)    GFR calc non Af Amer 6 (*)    GFR calc Af Amer 8 (*)    All other components within normal limits    Imaging Review US Scrotum  06/05/2015  CLINICAL DATA:  Testicular pain and swelling for 3 days. EXAM: ULTRASOUND OF SCROTUM TECHNIQUE: Complete ultrasound examination of the testicles, epididymis, and other scrotal structures was performed. COMPARISON:  Scrotal ultrasound 05/21/2015, 05/09/2015, 03/10/2015 FINDINGS: Right testicle Measurements: 4.4 x 2.3 x 2.1 cm. Homogeneous echotexture. No mass visualized. 1 mm calcification is noted, unchanged. Blood flow seen. Left testicle Measurements: 3.9 x 2.3 x 2.6 cm. Homogeneous echotexture. No mass or microlithiasis visualized. Blood flow is seen. Right epididymis: Prominent in size. Mild hyperemia persists but has diminished from prior exam. Left epididymis: Prominent in size. Mild hyperemia persist but has diminished from prior exam. Hydrocele:  None visualized. Varicocele:  Left varicocele is noted. IMPRESSION: 1. Chronic inflammatory changes about both epididymis, however improved from prior exams. 2. Single right testicular calcification, stable. Testes otherwise normal in sonographic appearance with blood flow. 3. Left varicocele. Electronically Signed   By: Jeb Levering M.D.   On: 06/05/2015 02:36   I have personally  reviewed and evaluated these images and lab results as part of my medical decision-making.   EKG Interpretation None      MDM   Final diagnoses:  Chronic epididymitis    I personally performed the services described in this documentation, which was scribed in my presence. The recorded information has been reviewed and is accurate.   Patient has chronic epididymitis. Patient states pain is similar to previous acute flares.  Has yet to see urology. We'll discharge home with antibiotics and pain medication. Return precautions given.  Frank Rice, MD 06/05/15 [phone]

## 2015-06-05 NOTE — ED Notes (Signed)
Pt to discharge via [place] with RN

## 2015-06-05 NOTE — ED Notes (Signed)
Pt given [place] sandwich and soda.

## 2015-06-05 NOTE — Discharge Instructions (Signed)
Epididymitis Epididymitis is swelling (inflammation) of the epididymis. The epididymis is a cord-like structure that is located along the top and back part of the testicle. It collects and stores sperm from the testicle. This condition can also cause pain and swelling of the testicle and scrotum. Symptoms usually start suddenly (acute epididymitis). Sometimes epididymitis starts gradually and lasts for a while (chronic epididymitis). This type may be harder to treat. CAUSES In men 69 and younger, this condition is usually caused by a bacterial infection or sexually transmitted disease (STD), such as:  Gonorrhea.  Chlamydia.  In men 44 and older who do not have anal sex, this condition is usually caused by bacteria from a blockage or abnormalities in the urinary system. These can result from:  Having a tube placed into the bladder (urinary catheter).  Having an enlarged or inflamed prostate gland.  Having recent urinary tract surgery. In men who have a condition that weakens the body's defense system (immune system), such as HIV, this condition can be caused by:   Other bacteria, including tuberculosis and syphilis.  Viruses.  Fungi. Sometimes this condition occurs without infection. That may happen if urine flows backward into the epididymis after heavy lifting or straining. RISK FACTORS This condition is more likely to develop in men:  Who have unprotected sex with more than one partner.  Who have anal sex.   Who have recently had surgery.   Who have a urinary catheter.  Who have urinary problems.  Who have a suppressed immune system. SYMPTOMS  This condition usually begins suddenly with chills, fever, and pain behind the scrotum and in the testicle. Other symptoms include:   Swelling of the scrotum, testicle, or both.  Pain whenejaculatingor urinating.  Pain in the back or belly.  Nausea.  Itching and discharge from the penis.  Frequent need to pass  urine.  Redness and tenderness of the scrotum. DIAGNOSIS Your health care provider can diagnose this condition based on your symptoms and medical history. Your health care provider will also do a physical exam to ask about your symptoms and check your scrotum and testicle for swelling, pain, and redness. You may also have other tests, including:   Examination of discharge from the penis.  Urine tests for infections, such as STDs.  Your health care provider may test you for other STDs, including HIV. TREATMENT Treatment for this condition depends on the cause. If your condition is caused by a bacterial infection, oral antibiotic medicine may be prescribed. If the bacterial infection has spread to your blood, you may need to receive IV antibiotics. Nonbacterial epididymitis is treated with home care that includes bed rest and elevation of the scrotum. Surgery may be needed to treat:  Bacterial epididymitis that causes pus to build up in the scrotum (abscess).  Chronic epididymitis that has not responded to other treatments. HOME CARE INSTRUCTIONS Medicines  Take over-the-counter and prescription medicines only as told by your health care provider.   If you were prescribed an antibiotic medicine, take it as told by your health care provider. Do not stop taking the antibiotic even if your condition improves. Sexual Activity  If your epididymitis was caused by an STD, avoid sexual activity until your treatment is complete.  Inform your sexual partner or partners if you test positive for an STD. They may need to be treated.Do not engage in sexual activity with your partner or partners until their treatment is completed. General Instructions  Return to your normal activities as told  by your health care provider. Ask your health care provider what activities are safe for you.  Keep your scrotum elevated and supported while resting. Ask your health care provider if you should wear a  scrotal support, such as a jockstrap. Wear it as told by your health care provider.  If directed, apply ice to the affected area:   Put ice in a plastic bag.  Place a towel between your skin and the bag.  Leave the ice on for 20 minutes, 2-3 times per day.  Try taking a sitz bath to help with discomfort. This is a warm water bath that is taken while you are sitting down. The water should only come up to your hips and should cover your buttocks. Do this 3-4 times per day or as told by your health care provider.  Keep all follow-up visits as told by your health care provider. This is important. SEEK MEDICAL CARE IF:   You have a fever.   Your pain medicine is not helping.   Your pain is getting worse.   Your symptoms do not improve within three days.   This information is not intended to replace advice given to you by your health care provider. Make sure you discuss any questions you have with your health care provider.   Document Released: 07/05/2000 Document Revised: 03/29/2015 Document Reviewed: 11/23/2014 Elsevier Interactive Patient Education [employer].

## 2015-06-05 NOTE — ED Notes (Addendum)
C/o pain and swelling to L testicle x 3-4 days.  Also reports pain and swelling to surgical site from recent hernia repair (4 weeks ago per patient). Per chart Right inguinal hernia repair was 7/26 .

## 2015-06-18 ENCOUNTER — Encounter (HOSPITAL_COMMUNITY): Payer: Self-pay | Admitting: *Deleted

## 2015-06-18 DIAGNOSIS — Z7901 Long term (current) use of anticoagulants: Secondary | ICD-10-CM | POA: Diagnosis not present

## 2015-06-18 DIAGNOSIS — D631 Anemia in chronic kidney disease: Secondary | ICD-10-CM | POA: Insufficient documentation

## 2015-06-18 DIAGNOSIS — E875 Hyperkalemia: Principal | ICD-10-CM | POA: Insufficient documentation

## 2015-06-18 DIAGNOSIS — Z940 Kidney transplant status: Secondary | ICD-10-CM | POA: Diagnosis not present

## 2015-06-18 DIAGNOSIS — N186 End stage renal disease: Secondary | ICD-10-CM | POA: Diagnosis not present

## 2015-06-18 DIAGNOSIS — G8929 Other chronic pain: Secondary | ICD-10-CM | POA: Diagnosis not present

## 2015-06-18 DIAGNOSIS — Z86718 Personal history of other venous thrombosis and embolism: Secondary | ICD-10-CM | POA: Diagnosis not present

## 2015-06-18 DIAGNOSIS — I132 Hypertensive heart and chronic kidney disease with heart failure and with stage 5 chronic kidney disease, or end stage renal disease: Secondary | ICD-10-CM | POA: Insufficient documentation

## 2015-06-18 DIAGNOSIS — Z79899 Other long term (current) drug therapy: Secondary | ICD-10-CM | POA: Diagnosis not present

## 2015-06-18 DIAGNOSIS — I5042 Chronic combined systolic (congestive) and diastolic (congestive) heart failure: Secondary | ICD-10-CM | POA: Diagnosis not present

## 2015-06-18 DIAGNOSIS — Z86711 Personal history of pulmonary embolism: Secondary | ICD-10-CM | POA: Diagnosis not present

## 2015-06-18 DIAGNOSIS — N451 Epididymitis: Secondary | ICD-10-CM | POA: Diagnosis not present

## 2015-06-18 DIAGNOSIS — Z992 Dependence on renal dialysis: Secondary | ICD-10-CM | POA: Diagnosis not present

## 2015-06-18 DIAGNOSIS — F329 Major depressive disorder, single episode, unspecified: Secondary | ICD-10-CM | POA: Insufficient documentation

## 2015-06-18 DIAGNOSIS — F419 Anxiety disorder, unspecified: Secondary | ICD-10-CM | POA: Diagnosis not present

## 2015-06-18 DIAGNOSIS — Z7952 Long term (current) use of systemic steroids: Secondary | ICD-10-CM | POA: Diagnosis not present

## 2015-06-18 DIAGNOSIS — Z87891 Personal history of nicotine dependence: Secondary | ICD-10-CM | POA: Diagnosis not present

## 2015-06-18 DIAGNOSIS — E1122 Type 2 diabetes mellitus with diabetic chronic kidney disease: Secondary | ICD-10-CM | POA: Insufficient documentation

## 2015-06-18 NOTE — ED Notes (Signed)
abd pain for 4-5 days with nv and diarrfhea

## 2015-06-19 ENCOUNTER — Encounter (HOSPITAL_COMMUNITY): Payer: Self-pay | Admitting: Family Medicine

## 2015-06-19 ENCOUNTER — Observation Stay (HOSPITAL_COMMUNITY)
Admission: EM | Admit: 2015-06-19 | Discharge: 2015-06-19 | Disposition: A | Discharge status: Home / Self Care | Payer: Medicare Other | Attending: Internal Medicine | Admitting: Internal Medicine

## 2015-06-19 DIAGNOSIS — I5042 Chronic combined systolic (congestive) and diastolic (congestive) heart failure: Secondary | ICD-10-CM

## 2015-06-19 DIAGNOSIS — E875 Hyperkalemia: Principal | ICD-10-CM

## 2015-06-19 DIAGNOSIS — N451 Epididymitis: Secondary | ICD-10-CM

## 2015-06-19 DIAGNOSIS — Z992 Dependence on renal dialysis: Secondary | ICD-10-CM

## 2015-06-19 DIAGNOSIS — I10 Essential (primary) hypertension: Secondary | ICD-10-CM

## 2015-06-19 DIAGNOSIS — D638 Anemia in other chronic diseases classified elsewhere: Secondary | ICD-10-CM

## 2015-06-19 DIAGNOSIS — I2782 Chronic pulmonary embolism: Secondary | ICD-10-CM

## 2015-06-19 DIAGNOSIS — E1129 Type 2 diabetes mellitus with other diabetic kidney complication: Secondary | ICD-10-CM | POA: Diagnosis present

## 2015-06-19 DIAGNOSIS — N186 End stage renal disease: Secondary | ICD-10-CM

## 2015-06-19 DIAGNOSIS — IMO0002 Reserved for concepts with insufficient information to code with codable children: Secondary | ICD-10-CM | POA: Diagnosis present

## 2015-06-19 DIAGNOSIS — I160 Hypertensive urgency: Secondary | ICD-10-CM

## 2015-06-19 DIAGNOSIS — E1122 Type 2 diabetes mellitus with diabetic chronic kidney disease: Secondary | ICD-10-CM

## 2015-06-19 DIAGNOSIS — R1030 Lower abdominal pain, unspecified: Secondary | ICD-10-CM

## 2015-06-19 DIAGNOSIS — E1165 Type 2 diabetes mellitus with hyperglycemia: Secondary | ICD-10-CM

## 2015-06-19 LAB — CBC WITH DIFFERENTIAL/PLATELET
BASOS PCT: 0 %
Basophils Absolute: 0 10*3/uL (ref 0.0–0.1)
Eosinophils Absolute: 0.2 10*3/uL (ref 0.0–0.7)
Eosinophils Relative: 3 %
HEMATOCRIT: 25.1 % — AB (ref 39.0–52.0)
HEMOGLOBIN: 8.1 g/dL — AB (ref 13.0–17.0)
Lymphocytes Relative: 10 %
Lymphs Abs: 0.7 10*3/uL (ref 0.7–4.0)
MCH: 27.8 pg (ref 26.0–34.0)
MCHC: 32.3 g/dL (ref 30.0–36.0)
MCV: 86.3 fL (ref 78.0–100.0)
MONOS PCT: 10 %
Monocytes Absolute: 0.7 10*3/uL (ref 0.1–1.0)
NEUTROS ABS: 5.3 10*3/uL (ref 1.7–7.7)
NEUTROS PCT: 77 %
Platelets: 153 10*3/uL (ref 150–400)
RBC: 2.91 MIL/uL — AB (ref 4.22–5.81)
RDW: 17.4 % — ABNORMAL HIGH (ref 11.5–15.5)
WBC: 6.8 10*3/uL (ref 4.0–10.5)

## 2015-06-19 LAB — POCT I-STAT, CHEM 8
BUN: 75 mg/dL — ABNORMAL HIGH (ref 6–20)
BUN: 42 mg/dL — AB (ref 6–20)
CHLORIDE: 105 mmol/L (ref 101–111)
CHLORIDE: 103 mmol/L (ref 101–111)
Calcium, Ion: 1.15 mmol/L (ref 1.12–1.23)
Calcium, Ion: 1.14 mmol/L (ref 1.12–1.23)
Creatinine, Ser: 10.6 mg/dL — ABNORMAL HIGH (ref 0.61–1.24)
Creatinine, Ser: 7 mg/dL — ABNORMAL HIGH (ref 0.61–1.24)
GLUCOSE: 148 mg/dL — AB (ref 65–99)
Glucose, Bld: 109 mg/dL — ABNORMAL HIGH (ref 65–99)
HEMATOCRIT: 25 % — AB (ref 39.0–52.0)
HEMATOCRIT: 26 % — AB (ref 39.0–52.0)
Hemoglobin: 8.5 g/dL — ABNORMAL LOW (ref 13.0–17.0)
Hemoglobin: 8.8 g/dL — ABNORMAL LOW (ref 13.0–17.0)
POTASSIUM: 5.4 mmol/L — AB (ref 3.5–5.1)
POTASSIUM: 3.9 mmol/L (ref 3.5–5.1)
SODIUM: 143 mmol/L (ref 135–145)
SODIUM: 143 mmol/L (ref 135–145)
TCO2: 23 mmol/L (ref 0–100)
TCO2: 25 mmol/L (ref 0–100)

## 2015-06-19 LAB — COMPREHENSIVE METABOLIC PANEL
ALK PHOS: 127 U/L — AB (ref 38–126)
ALT: 17 U/L (ref 17–63)
AST: 58 U/L — ABNORMAL HIGH (ref 15–41)
Albumin: 3.1 g/dL — ABNORMAL LOW (ref 3.5–5.0)
Anion gap: 11 (ref 5–15)
BUN: 93 mg/dL — ABNORMAL HIGH (ref 6–20)
CALCIUM: 8.8 mg/dL — AB (ref 8.9–10.3)
CHLORIDE: 108 mmol/L (ref 101–111)
CO2: 23 mmol/L (ref 22–32)
CREATININE: 14.07 mg/dL — AB (ref 0.61–1.24)
GFR, EST AFRICAN AMERICAN: 4 mL/min — AB (ref >60)
GFR, EST NON AFRICAN AMERICAN: 3 mL/min — AB (ref >60)
Glucose, Bld: 116 mg/dL — ABNORMAL HIGH (ref 65–99)
Potassium: 6.5 mmol/L (ref 3.5–5.1)
Sodium: 142 mmol/L (ref 135–145)
Total Bilirubin: 0.5 mg/dL (ref 0.3–1.2)
Total Protein: 7.6 g/dL (ref 6.5–8.1)

## 2015-06-19 LAB — RENAL FUNCTION PANEL
ALBUMIN: 2.8 g/dL — AB (ref 3.5–5.0)
ANION GAP: 12 (ref 5–15)
BUN: 97 mg/dL — ABNORMAL HIGH (ref 6–20)
CALCIUM: 8.6 mg/dL — AB (ref 8.9–10.3)
CO2: 23 mmol/L (ref 22–32)
Chloride: 107 mmol/L (ref 101–111)
Creatinine, Ser: 14.87 mg/dL — ABNORMAL HIGH (ref 0.61–1.24)
GFR, EST AFRICAN AMERICAN: 4 mL/min — AB (ref >60)
GFR, EST NON AFRICAN AMERICAN: 3 mL/min — AB (ref >60)
Glucose, Bld: 84 mg/dL (ref 65–99)
PHOSPHORUS: 4.7 mg/dL — AB (ref 2.5–4.6)
POTASSIUM: 6.8 mmol/L — AB (ref 3.5–5.1)
SODIUM: 142 mmol/L (ref 135–145)

## 2015-06-19 LAB — PROTIME-INR
INR: 1.19 (ref 0.00–1.49)
Prothrombin Time: 15.3 seconds — ABNORMAL HIGH (ref 11.6–15.2)

## 2015-06-19 LAB — CBC
HCT: 27.4 % — ABNORMAL LOW (ref 39.0–52.0)
Hemoglobin: 8.8 g/dL — ABNORMAL LOW (ref 13.0–17.0)
MCH: 28.2 pg (ref 26.0–34.0)
MCHC: 32.1 g/dL (ref 30.0–36.0)
MCV: 87.8 fL (ref 78.0–100.0)
PLATELETS: 173 10*3/uL (ref 150–400)
RBC: 3.12 MIL/uL — AB (ref 4.22–5.81)
RDW: 17.7 % — ABNORMAL HIGH (ref 11.5–15.5)
WBC: 8.1 10*3/uL (ref 4.0–10.5)

## 2015-06-19 LAB — LIPASE, BLOOD: LIPASE: 27 U/L (ref 11–51)

## 2015-06-19 MED ORDER — HYDRALAZINE HCL 20 MG/ML IJ SOLN
5.0000 mg | Freq: Once | INTRAMUSCULAR | Status: AC
  Administered 2015-06-19: 5 mg via INTRAVENOUS
  Filled 2015-06-19: qty 1

## 2015-06-19 MED ORDER — ATORVASTATIN CALCIUM 40 MG PO TABS
40.0000 mg | ORAL_TABLET | Freq: Every day | ORAL | Status: DC
  Administered 2015-06-19: 40 mg via ORAL
  Filled 2015-06-19: qty 1

## 2015-06-19 MED ORDER — INSULIN ASPART 100 UNIT/ML IV SOLN
10.0000 [IU] | Freq: Once | INTRAVENOUS | Status: AC
  Administered 2015-06-19: 10 [IU] via INTRAVENOUS
  Filled 2015-06-19: qty 1

## 2015-06-19 MED ORDER — LISINOPRIL 20 MG PO TABS
20.0000 mg | ORAL_TABLET | Freq: Every day | ORAL | Status: DC
  Administered 2015-06-19: 20 mg via ORAL
  Filled 2015-06-19: qty 1

## 2015-06-19 MED ORDER — OXYCODONE-ACETAMINOPHEN 5-325 MG PO TABS
ORAL_TABLET | ORAL | Status: AC
  Filled 2015-06-19: qty 1

## 2015-06-19 MED ORDER — OXYCODONE-ACETAMINOPHEN 5-325 MG PO TABS
1.0000 | ORAL_TABLET | ORAL | Status: DC | PRN
  Administered 2015-06-19 (×2): 1 via ORAL
  Filled 2015-06-19: qty 1

## 2015-06-19 MED ORDER — GABAPENTIN 100 MG PO CAPS
100.0000 mg | ORAL_CAPSULE | Freq: Three times a day (TID) | ORAL | Status: DC
  Administered 2015-06-19: 100 mg via ORAL
  Filled 2015-06-19: qty 1

## 2015-06-19 MED ORDER — WARFARIN SODIUM 7.5 MG PO TABS
7.5000 mg | ORAL_TABLET | Freq: Once | ORAL | Status: AC
Start: 2015-06-19 — End: 2015-06-19
  Administered 2015-06-19: 7.5 mg via ORAL
  Filled 2015-06-19: qty 1

## 2015-06-19 MED ORDER — OXYCODONE-ACETAMINOPHEN 5-325 MG PO TABS: 2.0000 | ORAL_TABLET | ORAL | Status: DC | PRN

## 2015-06-19 MED ORDER — AMLODIPINE BESYLATE 10 MG PO TABS
10.0000 mg | ORAL_TABLET | Freq: Every day | ORAL | Status: DC
  Filled 2015-06-19: qty 1

## 2015-06-19 MED ORDER — DOXAZOSIN MESYLATE 2 MG PO TABS
2.0000 mg | ORAL_TABLET | Freq: Every day | ORAL | Status: DC
  Administered 2015-06-19: 2 mg via ORAL
  Filled 2015-06-19: qty 1

## 2015-06-19 MED ORDER — ALPRAZOLAM 0.5 MG PO TABS: 0.5000 mg | ORAL_TABLET | Freq: Two times a day (BID) | ORAL | Status: DC | PRN

## 2015-06-19 MED ORDER — DEXTROSE 50 % IV SOLN
50.0000 mL | Freq: Once | INTRAVENOUS | Status: AC
  Administered 2015-06-19: 50 mL via INTRAVENOUS
  Filled 2015-06-19: qty 50

## 2015-06-19 MED ORDER — METHOCARBAMOL 500 MG PO TABS: 1000.0000 mg | ORAL_TABLET | Freq: Three times a day (TID) | ORAL | Status: DC | PRN

## 2015-06-19 MED ORDER — AMLODIPINE BESYLATE 5 MG PO TABS
10.0000 mg | ORAL_TABLET | Freq: Once | ORAL | Status: AC
  Administered 2015-06-19: 10 mg via ORAL
  Filled 2015-06-19: qty 2

## 2015-06-19 MED ORDER — CARVEDILOL 25 MG PO TABS
25.0000 mg | ORAL_TABLET | Freq: Two times a day (BID) | ORAL | Status: DC
  Administered 2015-06-19: 25 mg via ORAL
  Filled 2015-06-19: qty 1

## 2015-06-19 MED ORDER — PANTOPRAZOLE SODIUM 40 MG PO TBEC
40.0000 mg | DELAYED_RELEASE_TABLET | Freq: Every day | ORAL | Status: DC
  Administered 2015-06-19: 40 mg via ORAL
  Filled 2015-06-19: qty 1

## 2015-06-19 MED ORDER — WARFARIN - PHARMACIST DOSING INPATIENT
Freq: Every day | Status: DC
  Administered 2015-06-19: 18:00:00

## 2015-06-19 MED ORDER — SEVELAMER CARBONATE 800 MG PO TABS
800.0000 mg | ORAL_TABLET | Freq: Three times a day (TID) | ORAL | Status: DC
  Administered 2015-06-19: 1600 mg via ORAL
  Filled 2015-06-19: qty 2

## 2015-06-19 MED ORDER — RENA-VITE PO TABS: 1.0000 | ORAL_TABLET | Freq: Every day | ORAL | Status: DC

## 2015-06-19 MED ORDER — ONDANSETRON HCL 4 MG/2ML IJ SOLN
4.0000 mg | Freq: Four times a day (QID) | INTRAMUSCULAR | Status: DC | PRN
  Administered 2015-06-19: 4 mg via INTRAVENOUS
  Filled 2015-06-19: qty 2

## 2015-06-19 MED ORDER — OXYCODONE-ACETAMINOPHEN 5-325 MG PO TABS
1.0000 | ORAL_TABLET | ORAL | Status: DC | PRN
Start: 2015-06-19 — End: 2015-06-19
  Administered 2015-06-19: 1 via ORAL
  Filled 2015-06-19: qty 1

## 2015-06-19 MED ORDER — HYDRALAZINE HCL 50 MG PO TABS
50.0000 mg | ORAL_TABLET | Freq: Three times a day (TID) | ORAL | Status: DC
  Administered 2015-06-19: 50 mg via ORAL
  Filled 2015-06-19: qty 1

## 2015-06-19 MED ORDER — ISOSORBIDE MONONITRATE ER 60 MG PO TB24
60.0000 mg | ORAL_TABLET | Freq: Every day | ORAL | Status: DC
  Administered 2015-06-19: 60 mg via ORAL
  Filled 2015-06-19: qty 1

## 2015-06-19 MED ORDER — SODIUM CHLORIDE 0.9 % IJ SOLN: 3.0000 mL | Freq: Two times a day (BID) | INTRAMUSCULAR | Status: DC

## 2015-06-19 MED ORDER — SODIUM CHLORIDE 0.9 % IV SOLN
1.0000 g | Freq: Once | INTRAVENOUS | Status: AC
  Administered 2015-06-19: 1 g via INTRAVENOUS
  Filled 2015-06-19: qty 10

## 2015-06-19 MED ORDER — CINACALCET HCL 30 MG PO TABS
30.0000 mg | ORAL_TABLET | Freq: Every day | ORAL | Status: DC
  Filled 2015-06-19: qty 1

## 2015-06-19 NOTE — Progress Notes (Signed)
ANTICOAGULATION CONSULT NOTE - Initial Consult  Pharmacy Consult for Coumadin Indication: VTE history  Allergies  Allergen Reactions  . Ferrlecit [Na Ferric Gluc Cplx In Sucrose] Shortness Of Breath, Swelling and Other (See Comments)    Swelling in throat  . Darvocet [Propoxyphene N-Acetaminophen] Hives    Patient Measurements: Height: _0  (188 cm) Weight: 177 lb 1.6 oz (80.332 kg) IBW/kg (Calculated) : 82.2  Vital Signs: Temp: 98.6 F (37 C) (11/28 1300) Temp Source: Oral (11/28 1300) BP: 190/110 mmHg (11/28 1300) Pulse Rate: 82 (11/28 1300)  Labs:  Recent Labs  06/19/15 0002 06/19/15 0752 06/19/15 0831 06/19/15 1001 06/19/15 1300  HGB 8.8*  --  8.5* 8.8* 8.1*  HCT 27.4*  --  25.0* 26.0* 25.1*  PLT 173  --   --   --  153  LABPROT  --   --   --   --  15.3*  INR  --   --   --   --  1.19  CREATININE 14.07* 14.87* 10.60* 7.00*  --     Estimated Creatinine Clearance: 14.2 mL/min (by C-G formula based on Cr of 7).   Medical History: Past Medical History  Diagnosis Date  . Hypertension   . Depression   . Complication of anesthesia     itching, sore throat  . Diabetes mellitus without complication ([place])     No history per patient, but remains under history as A1c would not be accurate given on dialysis  . Shortness of breath   . Anxiety   . ESRD (end stage renal disease) ([place])     due to HTN per patient, followed at [facility], s/p failed kidney transplant - dialysis Tue, Th, Sat  . Renal insufficiency   . CHF (congestive heart failure) ([place])   . Anemia   . Dialysis patient [facility])      Assessment: 49 year old male on Coumadin PTA for VTE history with a history of non-compliance as wel. INR low on admission at 1.19 (o Coumadin 6 mg po daily?)  Goal of Therapy:  INR 2-3 Monitor platelets by anticoagulation protocol: Yes   Plan:  Coumadin 7.5 mg po x 1 today Daily INR  Thank you Anette Guarneri, PharmD [phone]  06/19/2015,2:27 PM

## 2015-06-19 NOTE — Discharge Summary (Addendum)
Physician Discharge Summary  Frank [name]:[id number] DOB: 1966-01-21 DOA: 06/19/2015  PCP: [name] Patient  Admit date: 06/19/2015 Discharge date: 06/19/2015  Time spent: 25 minutes  Recommendations for Outpatient Follow-up:  1. Discharge home with outpatient follow-up with urologist on 06/29/2015   Discharge Diagnoses:  Principal Problem:   Hyperkalemia  Active Problems:   ESRD on dialysis- Tues, Thurs, Sat in [place], nephro: [name]   Chronic combined systolic and diastolic CHF (congestive heart failure) ([place])   DM (diabetes mellitus), type 2, uncontrolled, with renal complications ([facility])   Accelerated hypertension  Hyperkalemia   Chronic pulmonary embolism ([facility])   Anemia of chronic disease   Chronic epididymitis   Groin pain, chronic, right   Discharge Condition: fair  Diet recommendation: renal  Filed Weights   06/19/15 0700 06/19/15 1120 06/19/15 1300  Weight: 78.8 kg (173 lb 11.6 oz) 76.2 kg (167 lb 15.9 oz) 80.332 kg (177 lb 1.6 oz)    History of present illness:  49 year old male with history of incisional disease on dialysis (Tuesdays, Thursdays and Saturday), type 2 diabetes mellitus, chronic systolic and diastolic CHF with last able 35% in May 2016, anemia of chronic renal disease, hypertension and chronic? Epididymitis currently following with urologist Dr. Felicie Morn at [facility] presented to the ED with worsening right groin pain. Patient seen several times in the ED and following with urologist as outpatient and has several scrotal ultrasound done in the past 2 months. He also had a CT of his abdomen and pelvis without contrast done 2 weeks back (on 11/16) which showed a 3 cm located fluid collection with thickened walls in the left hemiabdomen possibly inflammatory. Also showed an exophytic solid-appearing lesion in the left kidney concerning for renal cell carcinoma. Pelvic area appears unremarkable. Last scrotal ultrasound done on 11/17  showed mildly enlarged right epididymis with normal vascular flow and a small left hydrocele with improving scrotal wall edema. Patient was given a course of Levaquin as outpatient. Patient reported missing his last dialysis and having significant right groin/scrotal pain. In the ED patient was found to have accelerated hypertension with blood work showing hyperkalemia with potassium of 6.8. Patient received IV calcium gluconate and insulin with D50. Renal consulted and patient hospitalized for dialysis this morning.  Hospital Course:  Hyperkalemia with underlying ESRD Patient reported missing his last dialysis. Received calcium gluconate with IV insulin and D50 in the ED. Patient underwent dialysis this morning and subsequent labs were improving. Appreciate renal recommendations.  Accelerated hypertension Possibly associated with volume overload and resolved after dialysis. Resume home blood pressure medications.  Right groin pain with history of underlying chronic epididymitis Patient recently completed a course of Levaquin as outpatient. He is following with urologist Dr. Felicie Morn at [facility] (last seen on 11/13 and had CT of his abdomen and pelvis without contrast and scrotal ultrasound done). He has a follow-up with him on 12/8. The recent CT showed a possible left renal cell carcinoma which is urologist wants to follow-up with a repeat renal ultrasound prior to next visit. The left kidney is his transplanted kidney. -On clinical exam patient is afebrile without any signs of infection or sepsis. There is no scrotal edema on exam or palpable cords on his scrotal exam. He does have a right scrotal and testicular tenderness on exam The maximum area of tenderness is on external inguinal canal. Also found to have palpable inguinal lymph nodes. Of note patient had surgical repair of his in  castrated right hernia in July 2016. I consulted general surgery who evaluated the patient and recommend  that the hernia site repair site was well-healed with no bulge noted and no evidence of recurrent hernia on recent imaging. Did recommend outpatient urology follow-up. -Patient has recently completed antibiotic course. Symptoms are better this afternoon. I would prescribe him with some pain medications and have instructed to keep his appointment with his urologist next week.  Accelerated hypertension Possibly secondary to volume overload. Improved after dialysis. Resume home blood pressure medications. Neck  Chronic pain Have prescribed him some pain medications.   history of DVT/PE Continue warfarin. INR sub therapeutic. Concern for medication adherence and needs to be addressed as outpt..  Chronic combined systolic and diastolic CHF Continue Coreg, hydralazine, lisinopril and Imdur. Symptoms resolved after dialysis. Had cardiac cath at [facility] few weeks back showing clean coronaries  Procedures:  Dialysis  Consultations:  Renal  Surgery  Discharge Exam: Filed Vitals:   06/19/15 1300 06/19/15 1540  BP: 190/110 163/93  Pulse: 82   Temp: 98.6 F (37 C)   Resp: 19     General: Middle aged male not in distress HEENT: No pallor, moist oral mucosa, supple neck Chest: Clear to auscultation bilaterally CVS: Normal S1-S2, no murmurs rub or gallop GI: Soft, nondistended, bowel sounds present, right groin tenderness with palpable lymph nodes, right scrotal and testicular tenderness, no swelling or scrotal edema, no penile tenderness Musculoskeletal: Warm, no edema CNS: Alert and oriented  Discharge Instructions    Current Discharge Medication List    CONTINUE these medications which have CHANGED   Details  oxyCODONE-acetaminophen (PERCOCET/ROXICET) 5-325 MG tablet Take 2 tablets by mouth every 4 (four) hours as needed for moderate pain. Qty: 30 tablet, Refills: 0      CONTINUE these medications which have NOT CHANGED   Details  ALPRAZolam (XANAX) 0.5 MG tablet Take 0.5  mg by mouth 2 (two) times daily as needed for anxiety.    amLODipine (NORVASC) 10 MG tablet Take 10 mg by mouth daily.    atorvastatin (LIPITOR) 40 MG tablet Take 1 tablet (40 mg total) by mouth daily at 6 PM. Qty: 30 tablet, Refills: 1    carvedilol (COREG) 25 MG tablet Take 1 tablet (25 mg total) by mouth 2 (two) times daily with a meal.    cinacalcet (SENSIPAR) 30 MG tablet Take 30 mg by mouth daily.    cloNIDine (CATAPRES - DOSED IN MG/24 HR) 0.1 mg/24hr patch Place 0.1 mg onto the skin once a week.    docusate sodium (COLACE) 100 MG capsule Take 1 capsule (100 mg total) by mouth 2 (two) times daily. Qty: 60 capsule, Refills: 0    doxazosin (CARDURA) 2 MG tablet Take 2 mg by mouth daily.    doxercalciferol (HECTOROL) 4 MCG/2ML injection Inject 1.25 mLs (2.5 mcg total) into the vein Every Tuesday,Thursday,and Saturday with dialysis. Qty: 2 mL    gabapentin (NEURONTIN) 100 MG capsule Take 100 mg by mouth 3 (three) times daily.    hydrALAZINE (APRESOLINE) 50 MG tablet Take 1 tablet (50 mg total) by mouth every 8 (eight) hours. Qty: 90 tablet, Refills: 1    hydrocerin (EUCERIN) CREA Apply 1 application topically 2 (two) times daily. Qty: 454 g, Refills: 0    isosorbide mononitrate (IMDUR) 60 MG 24 hr tablet Take 60 mg by mouth daily.    levalbuterol (XOPENEX HFA) 45 MCG/ACT inhaler Inhale 2 puffs into the lungs every 6 (six) hours as needed for wheezing  or shortness of breath. Qty: 1 Inhaler, Refills: 2    lisinopril (PRINIVIL,ZESTRIL) 20 MG tablet Take 1 tablet (20 mg total) by mouth daily. Qty: 30 tablet, Refills: 1    methocarbamol (ROBAXIN) 500 MG tablet Take 2 tablets (1,000 mg total) by mouth every 8 (eight) hours as needed for muscle spasms. Qty: 20 tablet, Refills: 0    multivitamin (RENA-VIT) TABS tablet Take 1 tablet by mouth at bedtime. Qty: 30 tablet, Refills: 1    omeprazole (PRILOSEC) 20 MG capsule Take 20 mg by mouth daily.    ondansetron (ZOFRAN-ODT) 4 MG  disintegrating tablet Take 1 tablet (4 mg total) by mouth every 8 (eight) hours as needed for nausea or vomiting. Qty: 20 tablet, Refills: 0    predniSONE (DELTASONE) 5 MG tablet Take 5 mg by mouth daily with breakfast.    sevelamer carbonate (RENVELA) 800 MG tablet Take 800-1,600 mg by mouth 3 (three) times daily with meals. 2 tabs three times daily with meals, and 1 tablet with snacks    warfarin (COUMADIN) 6 MG tablet Take 1 tablet (6 mg total) by mouth daily at 6 PM. Take daily until adjusted per MD Qty: 20 tablet, Refills: 0      STOP taking these medications     levofloxacin (LEVAQUIN) 500 MG tablet      oxyCODONE (ROXICODONE) 5 MG immediate release tablet        Allergies  Allergen Reactions  . Ferrlecit [Na Ferric Gluc Cplx In Sucrose] Shortness Of Breath, Swelling and Other (See Comments)    Swelling in throat  . Darvocet [Propoxyphene N-Acetaminophen] Hives   Follow-up Information    Follow up with Caren Macadam, MD On 06/29/2015.   Specialty:  Urology   Contact information:   [place] [phone]        The results of significant diagnostics from this hospitalization (including imaging, microbiology, ancillary and laboratory) are listed below for reference.    Significant Diagnostic Studies: US Scrotum  06/05/2015  CLINICAL DATA:  Testicular pain and swelling for 3 days. EXAM: ULTRASOUND OF SCROTUM TECHNIQUE: Complete ultrasound examination of the testicles, epididymis, and other scrotal structures was performed. COMPARISON:  Scrotal ultrasound 05/21/2015, 05/09/2015, 03/10/2015 FINDINGS: Right testicle Measurements: 4.4 x 2.3 x 2.1 cm. Homogeneous echotexture. No mass visualized. 1 mm calcification is noted, unchanged. Blood flow seen. Left testicle Measurements: 3.9 x 2.3 x 2.6 cm. Homogeneous echotexture. No mass or microlithiasis visualized. Blood flow is seen. Right epididymis: Prominent in size. Mild hyperemia  persists but has diminished from prior exam. Left epididymis: Prominent in size. Mild hyperemia persist but has diminished from prior exam. Hydrocele:  None visualized. Varicocele:  Left varicocele is noted. IMPRESSION: 1. Chronic inflammatory changes about both epididymis, however improved from prior exams. 2. Single right testicular calcification, stable. Testes otherwise normal in sonographic appearance with blood flow. 3. Left varicocele. Electronically Signed   By: Jeb Levering M.D.   On: 06/05/2015 02:36   US Scrotum  05/21/2015  CLINICAL DATA:  Acute onset of right testicular pain. Initial encounter. EXAM: SCROTAL ULTRASOUND DOPPLER ULTRASOUND OF THE TESTICLES TECHNIQUE: Complete ultrasound examination of the testicles, epididymis, and other scrotal structures was performed. Color and spectral Doppler ultrasound were also utilized to evaluate blood flow to the testicles. COMPARISON:  Scrotal ultrasound performed 05/09/2015 FINDINGS: Right testicle Measurements: 3.5 x 2.2 x 3.0 cm. No mass visualized. A single 2 mm calcification is noted at the superior aspect of the testis. Left testicle  Measurements: 3.1 x 2.2 x 2.8 cm. No mass or microlithiasis visualized. Right epididymis: Normal in size and appearance, though only partially characterized. Left epididymis:  Not visualized. Hydrocele:  None visualized. Varicocele:  None visualized. Pulsed Doppler interrogation of both testes demonstrates normal low resistance arterial and venous waveforms bilaterally. Chronic inflammatory change is noted at the scrotal wall, more prominent on the left. IMPRESSION: 1. No evidence of testicular torsion. 2. Small calcification noted within the right testis. Testes otherwise unremarkable. 3. Chronic inflammatory change at the scrotal wall, more prominent on the left. Electronically Signed   By: Garald Balding M.D.   On: 05/21/2015 06:13   [place] Art/ven Flow Abd Pelv Doppler  05/21/2015  CLINICAL DATA:  Acute onset of  right testicular pain. Initial encounter. EXAM: SCROTAL ULTRASOUND DOPPLER ULTRASOUND OF THE TESTICLES TECHNIQUE: Complete ultrasound examination of the testicles, epididymis, and other scrotal structures was performed. Color and spectral Doppler ultrasound were also utilized to evaluate blood flow to the testicles. COMPARISON:  Scrotal ultrasound performed 05/09/2015 FINDINGS: Right testicle Measurements: 3.5 x 2.2 x 3.0 cm. No mass visualized. A single 2 mm calcification is noted at the superior aspect of the testis. Left testicle Measurements: 3.1 x 2.2 x 2.8 cm. No mass or microlithiasis visualized. Right epididymis: Normal in size and appearance, though only partially characterized. Left epididymis:  Not visualized. Hydrocele:  None visualized. Varicocele:  None visualized. Pulsed Doppler interrogation of both testes demonstrates normal low resistance arterial and venous waveforms bilaterally. Chronic inflammatory change is noted at the scrotal wall, more prominent on the left. IMPRESSION: 1. No evidence of testicular torsion. 2. Small calcification noted within the right testis. Testes otherwise unremarkable. 3. Chronic inflammatory change at the scrotal wall, more prominent on the left. Electronically Signed   By: Garald Balding M.D.   On: 05/21/2015 06:13    Microbiology: No results found for this or any previous visit (from the past 240 hour(s)).   Labs: Basic Metabolic Panel:  Recent Labs Lab 06/19/15 0002 06/19/15 0752 06/19/15 0831 06/19/15 1001  NA 142 142 143 143  K 6.5* 6.8* 5.4* 3.9  CL 108 107 105 103  CO2 23 23  --   --   GLUCOSE 116* 84 148* 109*  BUN 93* 97* 75* 42*  CREATININE 14.07* 14.87* 10.60* 7.00*  CALCIUM 8.8* 8.6*  --   --   PHOS  --  4.7*  --   --    Liver Function Tests:  Recent Labs Lab 06/19/15 0002 06/19/15 0752  AST 58*  --   ALT 17  --   ALKPHOS 127*  --   BILITOT 0.5  --   PROT 7.6  --   ALBUMIN 3.1* 2.8*    Recent Labs Lab 06/19/15 0002   LIPASE 27   No results for input(s): AMMONIA in the last 168 hours. CBC:  Recent Labs Lab 06/19/15 0002 06/19/15 0831 06/19/15 1001 06/19/15 1300  WBC 8.1  --   --  6.8  NEUTROABS  --   --   --  5.3  HGB 8.8* 8.5* 8.8* 8.1*  HCT 27.4* 25.0* 26.0* 25.1*  MCV 87.8  --   --  86.3  PLT 173  --   --  153   Cardiac Enzymes: No results for input(s): CKTOTAL, CKMB, CKMBINDEX, TROPONINI in the last 168 hours. BNP: BNP (last 3 results)  Recent Labs  11/26/14 2352 01/09/15 1529 01/26/15 0421  BNP >4500.0* 3606.1* >4500.0*    ProBNP (last 3  results) No results for input(s): PROBNP in the last 8760 hours.  CBG: No results for input(s): GLUCAP in the last 168 hours.     SignedLouellen Molder  [facility] 06/19/2015, 4:03 PM

## 2015-06-19 NOTE — Consult Note (Signed)
[facility] General Surgery  Subjective: 49 y/o AA male with ESRD on HD ongoing b/l groin/testicular pain for 3 weeks.  He tells me the pain is the same it always is over the last 3 weeks, no improvement or worsening.  He denies any problems at the site of his hernia.  He denies recurrent hernia.  He denies N/V or obstructive symptoms.  He has been diagnosed with epididymitis and has been on antibiotics.  He is scheduled for urology on 06/29/15 at [facility].  Recent CT's (in care everywhere) do not show a hernia.  There are reactive LAD on his right groin which goes along with his infection.    CT abdomen/pelvis WO contrast 06/07/15 [facility]: 1. Evaluation is markedly limited due to lack of IV contrast, however there is similar appearance of 3 cm loculated fluid collection with thickened walls in the left hemiabdomen, which remains most concerning for abscess. There is likely reactive inflammation of the associated small bowel loops. 2. Exophytic solid-appearing lesion in the left kidney concerning for renal cell carcinoma. Would recommend MRI of the abdomen for further evaluation. 3. Soft tissue density in the left para-aortic region, which could represent a dilated lumbar vein, however an enlarged lymph node cannot be excluded. This can also be evaluated with the MRI of the abdomen. 4. Small fluid collection in the perisplenic region which is favored to represent ascites. Small amount of perihepatic fluid. Similar extensive mesenteric edema. 5. Similar cardiomegaly and moderate-sized pericardial effusion. 6. Additional findings as detailed above.  Abdominal ultrasound 06/02/15: 1. Left lower quadrant transplant kidney with moderate hydronephrosis and urothelial thickening. Resistive indices and other dedicated metrics were not obtained as part of this study. Clinical correlation advised. 2. Atrophic native right kidney compatible with medical renal disease. The native left kidney is not  identified. 3. Hepatomegaly and diffuse hepatic steatosis. Increased phasicity of the portal and hepatic veins may be seen in patients with heart failure or increased respiratory variability.   US Scrotum 06/05/15: 1. Chronic inflammatory changes about both epididymis, however improved from prior exams. 2. Single right testicular calcification, stable. Testes otherwise normal in sonographic appearance with blood flow. 3. Left varicocele.   PE: Right groin:  Inguinal canal is tender, multiple LAD palpated <1cm in size, no hernia, scrotum and testicle on right are tender to palpation Left groin:  No hernia, no tenderness, no LAD Penis:  Normal appearing, no discharge, rash, or lesions  A/P: Right groin pain Epididymitis Inguinal Lymphadenopathy Left kidney lesion 1.  No role for general surgery.  His hernia repair site is well healed, no bulge is identified.  No evidence of recurrent hernia on recent CT scans or on exam.   2.  Would recommend he follow up with urology on 06/29/15 who will address his left kidney which is concerning for RCC and his unresolved epididymitis.  If more urgent consult is needed, call [facility].

## 2015-06-19 NOTE — Consult Note (Signed)
Frank [name] is an 49 y.o. male referred by Dr Frank [name]   Chief Complaint: hyperkalemia, ESRD HPI: 49yo BM with ESRD on HD at [facility]  TTS comes to ER for testicular pain for the 3rd or 4th time. Says he saw a urologist in [place] but when asked what he was told he cannot give a straight answer.  Last HD was Friday and says he is not due til tues which is impossible as that would be 3d without HD.  When confronted with this he says "well then maybe I'm suppose to dialyze today".  K in ER 6.5.  Past Medical History  Diagnosis Date  . Hypertension   . Depression   . Complication of anesthesia     itching, sore throat  . Diabetes mellitus without complication ([place])     No history per patient, but remains under history as A1c would not be accurate given on dialysis  . Shortness of breath   . Anxiety   . ESRD (end stage renal disease) ([place])     due to HTN per patient, followed at [facility], s/p failed kidney transplant - dialysis Tue, Th, Sat  . Renal insufficiency   . CHF (congestive heart failure) ([place])   . Anemia   . Dialysis patient [facility])     Past Surgical History  Procedure Laterality Date  . Kidney receipient  2006    failed and started HD in March 2014  . Capd insertion    . Capd removal    . Left heart catheterization with coronary angiogram N/A 09/02/2014    Procedure: LEFT HEART CATHETERIZATION WITH CORONARY ANGIOGRAM;  Surgeon: [name], MD;  Location: [facility] CATH LAB;  Service: Cardiovascular;  Laterality: N/A;  . Inguinal hernia repair Right 02/14/2015    Procedure: REPAIR INCARCERATED RIGHT INGUINAL HERNIA;  Surgeon: [name], MD;  Location: [facility];  Service: General;  Laterality: Right;    History reviewed. No pertinent family history. No FH renal ds Social History:  reports that he has quit smoking. His smoking use included Cigarettes. He smoked 0.00 packs per day for 1 year. He has never used smokeless tobacco. He reports that he does not drink alcohol or use  illicit drugs.  Lives by himself in [place]  Allergies:  Allergies  Allergen Reactions  . Ferrlecit [Na Ferric Gluc Cplx In Sucrose] Shortness Of Breath, Swelling and Other (See Comments)    Swelling in throat  . Darvocet [Propoxyphene N-Acetaminophen] Hives     (Not in a hospital admission)   Lab Results: UA: ND   Recent Labs  06/19/15 0002  WBC 8.1  HGB 8.8*  HCT 27.4*  PLT 173   BMET  Recent Labs  06/19/15 0002  NA 142  K 6.5*  CL 108  CO2 23  GLUCOSE 116*  BUN 93*  CREATININE 14.07*  CALCIUM 8.8*   LFT  Recent Labs  06/19/15 0002  PROT 7.6  ALBUMIN 3.1*  AST 58*  ALT 17  ALKPHOS 127*  BILITOT 0.5   No results found.  ROS: No change in vision No SOB No CP CO Rt testicular pain Mild abd pain since hernia surgery No dysuria  PHYSICAL EXAM: Blood pressure 190/122, pulse 74, temperature 98.3 F (36.8 C), resp. rate 14, height _0  (1.88 m), weight 78.019 kg (172 lb), SpO2 96 %. HEENT: PERRLA EOMI NECK:Rt IJ permcath LUNGS:Clear CARDIAC:RRR ABD:+BS Mild Rt LQ tenderness, no rebound or guarding EXT:1+ edema NEURO:CNI Ox3 no asterixis  Assessment: 1. Hyperkalemia 2. Rt testicular pain ? Epididymitis 3. ESRD 4. HTN PLAN: 1. Urgent HD this AM.  2. Decrease volume to see if helps with BP 3. Resume BP meds 4. Needs to start being compliant   Laquisha Northcraft T 06/19/2015, 5:36 AM

## 2015-06-19 NOTE — Progress Notes (Signed)
  [facility] Progress Note   Subjective: reports "throwing up " po pain meds, wants IV pain meds for R scrotal pain. Says he was prescribed two weeks of po abx by [facility] urologist and completed them 1 wk ago, given for what was probably epididymitis, says he has f/u appt but not for 3 mos. Says also that he has a quartrer-sized mass on one of his kidneys and that the same urol group if planning to take the kidney out  Filed Vitals:   06/19/15 0602 06/19/15 0610 06/19/15 0615 06/19/15 0700  BP: 187/106 169/105 157/100 182/123  Pulse:    83  Temp:    98.2 F (36.8 C)  TempSrc:    Oral  Resp: _0 Height:      Weight:    78.8 kg (173 lb 11.6 oz)  SpO2:    98%   . oxyCODONE-acetaminophen         ondansetron (ZOFRAN) IV, oxyCODONE-acetaminophen Exam: Alert, no distress No jv Chest clear RRR  R IJ permcath No LE edema Scrotum very tender, not swollen, no erythema/ abcess, no penile drainage, no LAN Abd soft ntnd Neuro alert  [facility] [place] TTS Orders not available      Assessment: 1 Hyperkalemia 2 Rt testicular pain ?epididymitis- seeing urol in [place] 3 ESRD  4 HTN  Plan - HD underway this am, will be stable for dc after HD from our standpoint   Kelly Splinter MD [place] pager [phone]    cell [phone] 06/19/2015, 9:35 AM    Recent Labs Lab 06/19/15 0002 06/19/15 0752  NA 142 142  K 6.5* 6.8*  CL 108 107  CO2 23 23  GLUCOSE 116* 84  BUN 93* 97*  CREATININE 14.07* 14.87*  CALCIUM 8.8* 8.6*  PHOS  --  4.7*    Recent Labs Lab 06/19/15 0002 06/19/15 0752  AST 58*  --   ALT 17  --   ALKPHOS 127*  --   BILITOT 0.5  --   PROT 7.6  --   ALBUMIN 3.1* 2.8*    Recent Labs Lab 06/19/15 0002  WBC 8.1  HGB 8.8*  HCT 27.4*  MCV 87.8  PLT 173

## 2015-06-19 NOTE — H&P (Signed)
History and Physical  Patient Name: Frank [name]     IRW:[id number]    DOB: 1966/04/02    DOA: 06/19/2015 Referring physician: [name], MD PCP: [name] Patient      Chief Complaint: Groin pain  HPI: Frank [name] is a 49 y.o. male with a past medical history significant for ESRD on HD, NIDDM, chronic systolic and diastolic CHF last EF 76% May 2016, HTN, anemia of renal disease, and chronic epididymitis who presents with groin pain.  The patient is sleepy and is unclear about his presenting complaint. Per EDP, the patient presents with groin pain which recurred over the last few days and is similar to his previous chronic epididymitis.  He has been seen for this 5 times in the last 2 months, and had 6 scrotal ultrasounds. He has follow-up with urology on December 8. He was last treated with Levaquin for a second time November 14.  More pertinently, the patient is somnolent, nauseated, "wacky", and with leg swelling. His blood pressure is 190/122 mmHg. His potassium is 6.5 mmol/L, and his last dialysis was "Thursday" which seems doubtful because of the holiday.  The patient received calcium gluconate and insulin with D50 intravenously. Nephrology recommended dialysis later this morning.      Review of Systems:  All other systems negative except as just noted or noted in the history of present illness.  Allergies  Allergen Reactions  . Ferrlecit [Na Ferric Gluc Cplx In Sucrose] Shortness Of Breath, Swelling and Other (See Comments)    Swelling in throat  . Darvocet [Propoxyphene N-Acetaminophen] Hives    Prior to Admission medications   Medication Sig Start Date End Date Taking? Authorizing Provider  ALPRAZolam [name]) 0.5 MG tablet Take 0.5 mg by mouth 2 (two) times daily as needed for anxiety.   Yes Historical Provider, MD  amLODipine (NORVASC) 10 MG tablet Take 10 mg by mouth daily.   Yes Historical Provider, MD  atorvastatin (LIPITOR) 40 MG tablet Take 1 tablet (40 mg  total) by mouth daily at 6 PM. 09/02/14  Yes Barton Dubois, MD  carvedilol (COREG) 25 MG tablet Take 1 tablet (25 mg total) by mouth 2 (two) times daily with a meal. 10/10/14  Yes [name], DO  cinacalcet (SENSIPAR) 30 MG tablet Take 30 mg by mouth daily.   Yes Historical Provider, MD  cloNIDine (CATAPRES - DOSED IN MG/24 HR) 0.1 mg/24hr patch Place 0.1 mg onto the skin once a week.   Yes Historical Provider, MD  docusate sodium (COLACE) 100 MG capsule Take 1 capsule (100 mg total) by mouth 2 (two) times daily. 11/19/14  Yes [name], MD  doxazosin (CARDURA) 2 MG tablet Take 2 mg by mouth daily. 06/14/15 09/12/15 Yes Historical Provider, MD  doxercalciferol (HECTOROL) 4 MCG/2ML injection Inject 1.25 mLs (2.5 mcg total) into the vein Every Tuesday,Thursday,and Saturday with dialysis. 10/10/14  Yes [name], DO  gabapentin (NEURONTIN) 100 MG capsule Take 100 mg by mouth 3 (three) times daily. 06/14/15 09/12/15 Yes Historical Provider, MD  hydrALAZINE (APRESOLINE) 50 MG tablet Take 1 tablet (50 mg total) by mouth every 8 (eight) hours. 02/04/15  Yes [name], MD  hydrocerin (EUCERIN) CREA Apply 1 application topically 2 (two) times daily. 02/04/15  Yes [name], MD  isosorbide mononitrate (IMDUR) 60 MG 24 hr tablet Take 60 mg by mouth daily. 06/14/15 09/12/15 Yes Historical Provider, MD  levalbuterol Penne Lash HFA) 45 MCG/ACT inhaler Inhale 2 puffs into  the lungs every 6 (six) hours as needed for wheezing or shortness of breath. 11/19/14  Yes [name], MD  levofloxacin (LEVAQUIN) 500 MG tablet Take 1 tablet (500 mg total) by mouth daily. 06/05/15  Yes [name], MD  lisinopril (PRINIVIL,ZESTRIL) 20 MG tablet Take 1 tablet (20 mg total) by mouth daily. 02/04/15  Yes [name], MD  methocarbamol (ROBAXIN) 500 MG tablet Take 2 tablets (1,000 mg total) by mouth every 8 (eight) hours as needed for muscle spasms. 01/26/15  Yes [name], MD  multivitamin (RENA-VIT) TABS  tablet Take 1 tablet by mouth at bedtime. 02/04/15  Yes [name], MD  omeprazole (PRILOSEC) 20 MG capsule Take 20 mg by mouth daily. 07/01/13  Yes Historical Provider, MD  ondansetron (ZOFRAN-ODT) 4 MG disintegrating tablet Take 1 tablet (4 mg total) by mouth every 8 (eight) hours as needed for nausea or vomiting. 12/05/14  Yes Verlee Monte, MD  oxyCODONE (ROXICODONE) 5 MG immediate release tablet Take 1 tablet (5 mg total) by mouth every 4 (four) hours as needed for severe pain. 03/11/15  Yes Evelina Bucy, MD  oxyCODONE-acetaminophen (PERCOCET/ROXICET) 5-325 MG tablet Take 1 tablet by mouth every 4 (four) hours as needed for moderate pain. 06/05/15  Yes [name], MD  predniSONE (DELTASONE) 5 MG tablet Take 5 mg by mouth daily with breakfast.   Yes Historical Provider, MD  sevelamer carbonate (RENVELA) 800 MG tablet Take 800-1,600 mg by mouth 3 (three) times daily with meals. 2 tabs three times daily with meals, and 1 tablet with snacks   Yes Historical Provider, MD  warfarin (COUMADIN) 6 MG tablet Take 1 tablet (6 mg total) by mouth daily at 6 PM. Take daily until adjusted per MD 02/17/15  Yes Eugenie Filler, MD    Past Medical History  Diagnosis Date  . Hypertension   . Depression   . Complication of anesthesia     itching, sore throat  . Diabetes mellitus without complication ([place])     No history per patient, but remains under history as A1c would not be accurate given on dialysis  . Shortness of breath   . Anxiety   . ESRD (end stage renal disease) ([place])     due to HTN per patient, followed at [facility], s/p failed kidney transplant - dialysis Tue, Th, Sat  . Renal insufficiency   . CHF (congestive heart failure) ([place])   . Anemia   . Dialysis patient [facility])     Past Surgical History  Procedure Laterality Date  . Kidney receipient  2006    failed and started HD in March 2014  . Capd insertion    . Capd removal    . Left heart catheterization with coronary angiogram N/A  09/02/2014    Procedure: LEFT HEART CATHETERIZATION WITH CORONARY ANGIOGRAM;  Surgeon: [name], MD;  Location: [facility] CATH LAB;  Service: Cardiovascular;  Laterality: N/A;  . Inguinal hernia repair Right 02/14/2015    Procedure: REPAIR INCARCERATED RIGHT INGUINAL HERNIA;  Surgeon: [name], MD;  Location: [facility];  Service: General;  Laterality: Right;    Family history: Patient denies.  Social History: Patient is a former smoker.      Physical Exam: BP 190/122 mmHg  Pulse 74  Temp(Src) 98.3 F (36.8 C)  Resp 14  Ht _0  (1.88 m)  Wt 78.019 kg (172 lb)  BMI 22.07 kg/m2  SpO2 96% General appearance: Well-developed, adult male, sleepy but rousable and in no acute  distress.   Eyes: Normal.     ENT: No nasal discharge.  OP moist without lesions.   Skin: Warm and dry.  In fact, very dry, excessively so and craquele all over. Cardiac: RRR, nl S1-S2, no murmurs appreciated.  2+ to shin LE edema.   Respiratory: Normal respiratory rate and rhythm.  CTAB without rales or wheezes. Abdomen: Abdomen soft without rigidity.  Mild TTP on R. No guarding.   MSK: No deformities or effusions. Neuro: Sleepy, but sensorium intact and responding to questions, drifts back off to sleep.  Speech is fluent.  Moves all extremities equally and with normal coordination.   Cranial nerves grossly intact. GU: Patient refuses. Psych: Behavior appropriate.  Affect normal.  No evidence of aural or visual hallucinations or delusions.       Labs on Admission:  The metabolic panel shows potassium 6.5 mmol/L Creatinine 14. AST 58 minimally elevated, total bilirubin normal. Lipase is normal. The complete blood count shows chronic stable anemia. No leukocytosis.   EKG: Independently reviewed in ER. Peaking of T waves.       Assessment/Plan 1. Hyperkalemia in ESRD:  This is new.  The patient does not remember his last dialysis day. He was already administered insulin and calcium gluconate. He will  undergo semi-urgent dialysis this morning, per nephrology. -Consult to nephrology for dialysis, appreciate cares      2. Chronic combined systolic and diastolic CHF:  No dyspnea. -Continue carvedilol, hydralazine, lisinopril and Imdur -Dialysis today  3. Anemia of renal disease:  Stable.    4. HTN:  -Administer home Imdur, amlodipine now -Hydralazine IV 5 mg now -Continue home BP, clonidine patch, doxazosin, and lisinopril -Dialysis later this morning  5. Chronic epididymitis:  Groin pain actually does not seem to be the patient's chief complaint, and he declines my exam. -Urology follow-up as previously scheduled  6. Chronic pain: -Continue Percocet  7. History of DVT/PE: -Continue warfarin, dosing per pharmacy    DVT PPx: Warfarin Diet: Renal Consultants: Nephrology Code Status: Full Medical decision making: What exists of the patient's previous chart  was reviewed in depth and the case was discussed with Dr. Lita Mains. Patient seen 5:19 AM on 06/19/2015.  Disposition Plan:  Admit for dialysis.  Likely home after, later today.      [name] [facility] Pager [phone]

## 2015-06-19 NOTE — Progress Notes (Signed)
Patient given discharge instructions and prescription. Patient verbalized understanding of instructions.  IV and telemetry removed. Patient walked off floor with belongings.

## 2015-06-20 LAB — HEPATITIS B SURFACE ANTIGEN: HEP B S AG: NEGATIVE

## 2015-06-20 LAB — HEPATITIS B SURFACE ANTIBODY,QUALITATIVE: HEP B S AB: REACTIVE

## 2015-06-20 LAB — HEPATITIS B CORE ANTIBODY, TOTAL: Hep B Core Total Ab: NEGATIVE

## 2015-06-30 ENCOUNTER — Emergency Department (HOSPITAL_COMMUNITY)
Admission: EM | Admit: 2015-06-30 | Discharge: 2015-06-30 | Disposition: A | Discharge status: Home / Self Care | Payer: Medicare Other | Attending: Emergency Medicine | Admitting: Emergency Medicine

## 2015-06-30 ENCOUNTER — Encounter (HOSPITAL_COMMUNITY): Payer: Self-pay | Admitting: Emergency Medicine

## 2015-06-30 DIAGNOSIS — Z87891 Personal history of nicotine dependence: Secondary | ICD-10-CM | POA: Insufficient documentation

## 2015-06-30 DIAGNOSIS — Z7901 Long term (current) use of anticoagulants: Secondary | ICD-10-CM | POA: Diagnosis not present

## 2015-06-30 DIAGNOSIS — N186 End stage renal disease: Secondary | ICD-10-CM | POA: Insufficient documentation

## 2015-06-30 DIAGNOSIS — F419 Anxiety disorder, unspecified: Secondary | ICD-10-CM | POA: Diagnosis not present

## 2015-06-30 DIAGNOSIS — N50811 Right testicular pain: Secondary | ICD-10-CM | POA: Diagnosis not present

## 2015-06-30 DIAGNOSIS — I120 Hypertensive chronic kidney disease with stage 5 chronic kidney disease or end stage renal disease: Secondary | ICD-10-CM | POA: Diagnosis not present

## 2015-06-30 DIAGNOSIS — Z7952 Long term (current) use of systemic steroids: Secondary | ICD-10-CM | POA: Diagnosis not present

## 2015-06-30 DIAGNOSIS — I509 Heart failure, unspecified: Secondary | ICD-10-CM | POA: Diagnosis not present

## 2015-06-30 DIAGNOSIS — Z79899 Other long term (current) drug therapy: Secondary | ICD-10-CM | POA: Diagnosis not present

## 2015-06-30 DIAGNOSIS — Z862 Personal history of diseases of the blood and blood-forming organs and certain disorders involving the immune mechanism: Secondary | ICD-10-CM | POA: Diagnosis not present

## 2015-06-30 DIAGNOSIS — F329 Major depressive disorder, single episode, unspecified: Secondary | ICD-10-CM | POA: Insufficient documentation

## 2015-06-30 DIAGNOSIS — N5089 Other specified disorders of the male genital organs: Secondary | ICD-10-CM | POA: Diagnosis not present

## 2015-06-30 DIAGNOSIS — Z87438 Personal history of other diseases of male genital organs: Secondary | ICD-10-CM

## 2015-06-30 DIAGNOSIS — Z992 Dependence on renal dialysis: Secondary | ICD-10-CM | POA: Insufficient documentation

## 2015-06-30 DIAGNOSIS — E119 Type 2 diabetes mellitus without complications: Secondary | ICD-10-CM | POA: Insufficient documentation

## 2015-06-30 MED ORDER — HYDROMORPHONE HCL 1 MG/ML IJ SOLN
2.0000 mg | Freq: Once | INTRAMUSCULAR | Status: AC
  Administered 2015-06-30: 2 mg via INTRAMUSCULAR
  Filled 2015-06-30: qty 2

## 2015-06-30 MED ORDER — LEVOFLOXACIN IN D5W 750 MG/150ML IV SOLN: 750.0000 mg | Freq: Once | INTRAVENOUS | Status: DC

## 2015-06-30 MED ORDER — LEVOFLOXACIN 750 MG PO TABS
750.0000 mg | ORAL_TABLET | Freq: Every day | ORAL | Status: DC
  Administered 2015-06-30: 750 mg via ORAL
  Filled 2015-06-30: qty 1

## 2015-06-30 MED ORDER — LEVOFLOXACIN 750 MG PO TABS: 750.0000 mg | ORAL_TABLET | Freq: Every day | ORAL | Status: DC

## 2015-06-30 MED ORDER — OXYCODONE-ACETAMINOPHEN 5-325 MG PO TABS: 1.0000 | ORAL_TABLET | Freq: Four times a day (QID) | ORAL | Status: DC | PRN

## 2015-06-30 NOTE — ED Notes (Signed)
Per EMS pt states he called due to groin pain.  He was unable to complete dialysis today due to the pain.  He had hernia surgery 3 weeks ago and has had swelling and pain since.  He was able to walk out to the ambulance.  He was given 100 of fentanyl IN as IV access was not successful.

## 2015-06-30 NOTE — ED Notes (Signed)
This RN provided the pt with a disposable scrub top to wear and was about to transport the pt to the waiting room when the pt asked this RN for an emesis bag and began to spit in and stated "I need some phenergan"

## 2015-06-30 NOTE — ED Notes (Signed)
Pt states now "i'm okay and ready to go"

## 2015-06-30 NOTE — Discharge Instructions (Signed)
Levaquin as prescribed.  Percocet as prescribed as needed for pain.  Please follow-up with your urologist to discuss this situation.

## 2015-06-30 NOTE — ED Notes (Signed)
Pt verbalized understanding of d/c instructions and has no further questions. Pt stable and NAD. Pt d/c to the waiting room to wait for ride.

## 2015-06-30 NOTE — ED Provider Notes (Signed)
CSN: [id number]     Arrival date & time 06/30/15  4098 History  By signing my name below, I, [name], attest that this documentation has been prepared under the direction and in the presence of Veryl Speak, MD. Electronically Signed: Judithann Sauger, ED Scribe. 06/30/2015. 3:38 AM.   Chief Complaint  Patient presents with  . Groin Pain   The history is provided by the patient. No language interpreter was used.   HPI Comments: Frank [name] is a 49 y.o. male with a hx of HTN, DM, CHF, and renal insufficiency who presents to the Emergency Department complaining of gradually worsening constant right testicular pain onset 3 days ago. He reports that he had an inguinal hernia repair on 02/14/15 and has had intermittent testicular pain since the surgery. He states that he has an upcoming appointment with his surgeon on 07/04/15 but has not spoken to him about his recurrent symptoms yet. No alleviating factors noted. Pt was seen here for same symptoms on 06/05/15 and prescribed antibiotics and pain medication.   Past Medical History  Diagnosis Date  . Hypertension   . Depression   . Complication of anesthesia     itching, sore throat  . Diabetes mellitus without complication ([place])     No history per patient, but remains under history as A1c would not be accurate given on dialysis  . Shortness of breath   . Anxiety   . ESRD (end stage renal disease) ([place])     due to HTN per patient, followed at [facility], s/p failed kidney transplant - dialysis Tue, Th, Sat  . Renal insufficiency   . CHF (congestive heart failure) ([place])   . Anemia   . Dialysis patient [facility])    Past Surgical History  Procedure Laterality Date  . Kidney receipient  2006    failed and started HD in March 2014  . Capd insertion    . Capd removal    . Left heart catheterization with coronary angiogram N/A 09/02/2014    Procedure: LEFT HEART CATHETERIZATION WITH CORONARY ANGIOGRAM;  Surgeon: [name], MD;   Location: [facility] CATH LAB;  Service: Cardiovascular;  Laterality: N/A;  . Inguinal hernia repair Right 02/14/2015    Procedure: REPAIR INCARCERATED RIGHT INGUINAL HERNIA;  Surgeon: [name], MD;  Location: [place];  Service: General;  Laterality: Right;   No family history on file. Social History  Substance Use Topics  . Smoking status: Former Smoker -- 0.00 packs/day for 1 years    Types: Cigarettes  . Smokeless tobacco: Never Used     Comment: quit Jan 2014  . Alcohol Use: No    Review of Systems  Constitutional: Negative for fever and chills.  Genitourinary: Positive for scrotal swelling and testicular pain. Negative for dysuria and discharge.  All other systems reviewed and are negative.     Allergies  Ferrlecit and Darvocet  Home Medications   Prior to Admission medications   Medication Sig Start Date End Date Taking? Authorizing Provider  ALPRAZolam [name]) 0.5 MG tablet Take 0.5 mg by mouth 2 (two) times daily as needed for anxiety.    Historical Provider, MD  amLODipine (NORVASC) 10 MG tablet Take 10 mg by mouth daily.    Historical Provider, MD  atorvastatin (LIPITOR) 40 MG tablet Take 1 tablet (40 mg total) by mouth daily at 6 PM. 09/02/14   Barton Dubois, MD  carvedilol (COREG) 25 MG tablet Take 1 tablet (25 mg total) by mouth 2 (two) times  daily with a meal. 10/10/14   Mechele Claude, DO  cinacalcet (SENSIPAR) 30 MG tablet Take 30 mg by mouth daily.    Historical Provider, MD  cloNIDine (CATAPRES - DOSED IN MG/24 HR) 0.1 mg/24hr patch Place 0.1 mg onto the skin once a week.    Historical Provider, MD  docusate sodium (COLACE) 100 MG capsule Take 1 capsule (100 mg total) by mouth 2 (two) times daily. 11/19/14   [name], MD  doxazosin (CARDURA) 2 MG tablet Take 2 mg by mouth daily. 06/14/15 09/12/15  Historical Provider, MD  doxercalciferol (HECTOROL) 4 MCG/2ML injection Inject 1.25 mLs (2.5 mcg total) into the vein Every Tuesday,Thursday,and Saturday with  dialysis. 10/10/14   Mechele Claude, DO  gabapentin (NEURONTIN) 100 MG capsule Take 100 mg by mouth 3 (three) times daily. 06/14/15 09/12/15  Historical Provider, MD  hydrALAZINE (APRESOLINE) 50 MG tablet Take 1 tablet (50 mg total) by mouth every 8 (eight) hours. 02/04/15   [name], MD  hydrocerin (EUCERIN) CREA Apply 1 application topically 2 (two) times daily. 02/04/15   [name], MD  isosorbide mononitrate (IMDUR) 60 MG 24 hr tablet Take 60 mg by mouth daily. 06/14/15 09/12/15  Historical Provider, MD  levalbuterol Penne Lash HFA) 45 MCG/ACT inhaler Inhale 2 puffs into the lungs every 6 (six) hours as needed for wheezing or shortness of breath. 11/19/14   [name], MD  lisinopril (PRINIVIL,ZESTRIL) 20 MG tablet Take 1 tablet (20 mg total) by mouth daily. 02/04/15   [name], MD  methocarbamol (ROBAXIN) 500 MG tablet Take 2 tablets (1,000 mg total) by mouth every 8 (eight) hours as needed for muscle spasms. 01/26/15   [name], MD  multivitamin (RENA-VIT) TABS tablet Take 1 tablet by mouth at bedtime. 02/04/15   [name], MD  omeprazole (PRILOSEC) 20 MG capsule Take 20 mg by mouth daily. 07/01/13   Historical Provider, MD  ondansetron (ZOFRAN-ODT) 4 MG disintegrating tablet Take 1 tablet (4 mg total) by mouth every 8 (eight) hours as needed for nausea or vomiting. 12/05/14   Verlee Monte, MD  oxyCODONE-acetaminophen (PERCOCET/ROXICET) 5-325 MG tablet Take 2 tablets by mouth every 4 (four) hours as needed for moderate pain. 06/19/15   Nishant Dhungel, MD  predniSONE (DELTASONE) 5 MG tablet Take 5 mg by mouth daily with breakfast.    Historical Provider, MD  sevelamer carbonate (RENVELA) 800 MG tablet Take 800-1,600 mg by mouth 3 (three) times daily with meals. 2 tabs three times daily with meals, and 1 tablet with snacks    Historical Provider, MD  warfarin (COUMADIN) 6 MG tablet Take 1 tablet (6 mg total) by mouth daily at 6 PM. Take daily until adjusted per MD 02/17/15    Eugenie Filler, MD   BP 181/121 mmHg  Pulse 90  Temp(Src) 98.7 F (37.1 C) (Oral)  Resp 20  Ht _0  (1.88 m)  Wt 165 lb (74.844 kg)  BMI 21.18 kg/m2  SpO2 94% Physical Exam  Constitutional: He is oriented to person, place, and time. He appears well-developed and well-nourished. No distress.  HENT:  Head: Normocephalic and atraumatic.  Eyes: Conjunctivae and EOM are normal.  Neck: Neck supple. No tracheal deviation present.  Cardiovascular: Normal rate.   Pulmonary/Chest: Effort normal. No respiratory distress.  Genitourinary:  Testicular pain   Musculoskeletal: Normal range of motion.  Neurological: He is alert and oriented to person, place, and time.  Skin: Skin is warm and dry.  Psychiatric: He has a normal mood and affect. His behavior is normal.  Nursing note and vitals reviewed.   ED Course  Procedures (including critical care time) DIAGNOSTIC STUDIES: Oxygen Saturation is 94% on RA, adequate by my interpretation.    COORDINATION OF CARE: 3:23 AM- Pt refused physical examination and stated that he wants pain medication first.   3:29 AM - Pt allowed examination and will prescribe antibiotics.    MDM   Final diagnoses:  None    Patient is a 49 year old male with history of end-stage renal disease on hemodialysis. He has been in the hospital in the emergency department multiple times for a recurrent epididymitis. He returns again today with complaints of severe pain in his right testicle. He was given fentanyl by EMS and is requesting more pain medication immediately upon arrival to the emergency department.  I went to the room to examine this patient and a very prompt fashion. I attempted to examine the areas were that were causing him his discomfort, however he refused to allow me to look until he received additional pain medication. I informed them that I needed to see exactly what I was treating for I was willing to give him medications. He became  confrontational and again demanded additional pain medication. After several minutes of back and forth, I informed him that I would see another patient while he was getting undressed and he could let the nurse know when he is ready for the physician to examine him.  After I left the room, was brought to my attention that he was making comments that he did not want to see me anymore and that he demanded transfer to [facility]. I return to the room and was able to calm him somewhat enough that he was willing to now allow me to examine him. His right testicle appears grossly normal. There is no swelling or redness of the scrotum. The testicle is freely mobile within the scrotum, however is tender to the touch.  He will be treated with an IM injection of Dilaudid, Levaquin, and discharged to home. He was also given a small quantity of Percocet to take for his pain. When reviewing this patient's medical record, he has had multiple visits for this same condition along with other painful conditions. He has received multiple prescriptions for narcotics from multiple providers at multiple pharmacies over the past several months. His demeanor and pattern of doctor shopping and prescription filling are concerning for drug-seeking behavior. I informed this patient that he really needs to see the urologist so this condition can ultimately be treated once and for all.  I personally performed the services described in this documentation, which was scribed in my presence. The recorded information has been reviewed and is accurate.       Veryl Speak, MD 06/30/15 [phone]

## 2015-06-30 NOTE — ED Notes (Signed)
2 Unsuccessful attempts by this RN at IV access.

## 2015-06-30 NOTE — ED Notes (Signed)
Dr Stark Jock ordered not to put in an IV.

## 2015-07-01 ENCOUNTER — Emergency Department (HOSPITAL_COMMUNITY)
Admission: EM | Admit: 2015-07-01 | Discharge: 2015-07-01 | Disposition: A | Discharge status: Home / Self Care | Payer: Medicare Other | Attending: Emergency Medicine | Admitting: Emergency Medicine

## 2015-07-01 ENCOUNTER — Encounter (HOSPITAL_COMMUNITY): Payer: Self-pay | Admitting: Emergency Medicine

## 2015-07-01 DIAGNOSIS — Z7952 Long term (current) use of systemic steroids: Secondary | ICD-10-CM | POA: Diagnosis not present

## 2015-07-01 DIAGNOSIS — Z862 Personal history of diseases of the blood and blood-forming organs and certain disorders involving the immune mechanism: Secondary | ICD-10-CM | POA: Diagnosis not present

## 2015-07-01 DIAGNOSIS — N451 Epididymitis: Secondary | ICD-10-CM | POA: Insufficient documentation

## 2015-07-01 DIAGNOSIS — N186 End stage renal disease: Secondary | ICD-10-CM | POA: Diagnosis not present

## 2015-07-01 DIAGNOSIS — E119 Type 2 diabetes mellitus without complications: Secondary | ICD-10-CM | POA: Insufficient documentation

## 2015-07-01 DIAGNOSIS — Z87891 Personal history of nicotine dependence: Secondary | ICD-10-CM | POA: Diagnosis not present

## 2015-07-01 DIAGNOSIS — R1030 Lower abdominal pain, unspecified: Secondary | ICD-10-CM | POA: Diagnosis not present

## 2015-07-01 DIAGNOSIS — I120 Hypertensive chronic kidney disease with stage 5 chronic kidney disease or end stage renal disease: Secondary | ICD-10-CM | POA: Diagnosis not present

## 2015-07-01 DIAGNOSIS — Z9889 Other specified postprocedural states: Secondary | ICD-10-CM | POA: Insufficient documentation

## 2015-07-01 DIAGNOSIS — I509 Heart failure, unspecified: Secondary | ICD-10-CM | POA: Diagnosis not present

## 2015-07-01 DIAGNOSIS — Z992 Dependence on renal dialysis: Secondary | ICD-10-CM | POA: Insufficient documentation

## 2015-07-01 DIAGNOSIS — R369 Urethral discharge, unspecified: Secondary | ICD-10-CM | POA: Insufficient documentation

## 2015-07-01 DIAGNOSIS — F419 Anxiety disorder, unspecified: Secondary | ICD-10-CM | POA: Insufficient documentation

## 2015-07-01 DIAGNOSIS — G8929 Other chronic pain: Secondary | ICD-10-CM | POA: Diagnosis not present

## 2015-07-01 DIAGNOSIS — Z79899 Other long term (current) drug therapy: Secondary | ICD-10-CM | POA: Insufficient documentation

## 2015-07-01 DIAGNOSIS — Z7901 Long term (current) use of anticoagulants: Secondary | ICD-10-CM | POA: Diagnosis not present

## 2015-07-01 DIAGNOSIS — F329 Major depressive disorder, single episode, unspecified: Secondary | ICD-10-CM | POA: Insufficient documentation

## 2015-07-01 DIAGNOSIS — N50811 Right testicular pain: Secondary | ICD-10-CM | POA: Diagnosis present

## 2015-07-01 DIAGNOSIS — R1031 Right lower quadrant pain: Secondary | ICD-10-CM

## 2015-07-01 MED ORDER — OXYCODONE-ACETAMINOPHEN 5-325 MG PO TABS
2.0000 | ORAL_TABLET | Freq: Once | ORAL | Status: AC
  Administered 2015-07-01: 2 via ORAL
  Filled 2015-07-01: qty 2

## 2015-07-01 MED ORDER — ONDANSETRON 4 MG PO TBDP
8.0000 mg | ORAL_TABLET | Freq: Once | ORAL | Status: AC
  Administered 2015-07-01: 8 mg via ORAL
  Filled 2015-07-01: qty 2

## 2015-07-01 NOTE — Discharge Instructions (Signed)
Epididymitis Epididymitis is swelling (inflammation) of the epididymis. The epididymis is a cord-like structure that is located along the top and back part of the testicle. It collects and stores sperm from the testicle. This condition can also cause pain and swelling of the testicle and scrotum. Symptoms usually start suddenly (acute epididymitis). Sometimes epididymitis starts gradually and lasts for a while (chronic epididymitis). This type may be harder to treat. CAUSES In men 12 and younger, this condition is usually caused by a bacterial infection or sexually transmitted disease (STD), such as:  Gonorrhea.  Chlamydia.  In men 47 and older who do not have anal sex, this condition is usually caused by bacteria from a blockage or abnormalities in the urinary system. These can result from:  Having a tube placed into the bladder (urinary catheter).  Having an enlarged or inflamed prostate gland.  Having recent urinary tract surgery. In men who have a condition that weakens the body's defense system (immune system), such as HIV, this condition can be caused by:   Other bacteria, including tuberculosis and syphilis.  Viruses.  Fungi. Sometimes this condition occurs without infection. That may happen if urine flows backward into the epididymis after heavy lifting or straining. RISK FACTORS This condition is more likely to develop in men:  Who have unprotected sex with more than one partner.  Who have anal sex.   Who have recently had surgery.   Who have a urinary catheter.  Who have urinary problems.  Who have a suppressed immune system. SYMPTOMS  This condition usually begins suddenly with chills, fever, and pain behind the scrotum and in the testicle. Other symptoms include:   Swelling of the scrotum, testicle, or both.  Pain whenejaculatingor urinating.  Pain in the back or belly.  Nausea.  Itching and discharge from the penis.  Frequent need to pass  urine.  Redness and tenderness of the scrotum. DIAGNOSIS Your health care provider can diagnose this condition based on your symptoms and medical history. Your health care provider will also do a physical exam to ask about your symptoms and check your scrotum and testicle for swelling, pain, and redness. You may also have other tests, including:   Examination of discharge from the penis.  Urine tests for infections, such as STDs.  Your health care provider may test you for other STDs, including HIV. TREATMENT Treatment for this condition depends on the cause. If your condition is caused by a bacterial infection, oral antibiotic medicine may be prescribed. If the bacterial infection has spread to your blood, you may need to receive IV antibiotics. Nonbacterial epididymitis is treated with home care that includes bed rest and elevation of the scrotum. Surgery may be needed to treat:  Bacterial epididymitis that causes pus to build up in the scrotum (abscess).  Chronic epididymitis that has not responded to other treatments. HOME CARE INSTRUCTIONS Medicines  Take over-the-counter and prescription medicines only as told by your health care provider.   If you were prescribed an antibiotic medicine, take it as told by your health care provider. Do not stop taking the antibiotic even if your condition improves. Sexual Activity  If your epididymitis was caused by an STD, avoid sexual activity until your treatment is complete.  Inform your sexual partner or partners if you test positive for an STD. They may need to be treated.Do not engage in sexual activity with your partner or partners until their treatment is completed. General Instructions  Return to your normal activities as told  by your health care provider. Ask your health care provider what activities are safe for you.  Keep your scrotum elevated and supported while resting. Ask your health care provider if you should wear a  scrotal support, such as a jockstrap. Wear it as told by your health care provider.  If directed, apply ice to the affected area:   Put ice in a plastic bag.  Place a towel between your skin and the bag.  Leave the ice on for 20 minutes, 2-3 times per day.  Try taking a sitz bath to help with discomfort. This is a warm water bath that is taken while you are sitting down. The water should only come up to your hips and should cover your buttocks. Do this 3-4 times per day or as told by your health care provider.  Keep all follow-up visits as told by your health care provider. This is important. SEEK MEDICAL CARE IF:   You have a fever.   Your pain medicine is not helping.   Your pain is getting worse.   Your symptoms do not improve within three days.   This information is not intended to replace advice given to you by your health care provider. Make sure you discuss any questions you have with your health care provider.   Document Released: 07/05/2000 Document Revised: 03/29/2015 Document Reviewed: 11/23/2014 Elsevier Interactive Patient Education [employer].

## 2015-07-01 NOTE — ED Provider Notes (Signed)
CSN: [id number]     Arrival date & time 07/01/15  1937 History   First MD Initiated Contact with Patient 07/01/15 2018     Chief Complaint  Patient presents with  . Abdominal Pain     (Consider location/radiation/quality/duration/timing/severity/associated sxs/prior Treatment) HPI Comments: Patient with history of hypertension, diabetes, ESRD on HD, CHF, presents to the emergency department with chief complaint of chronic right testicle pain. He reports some mild penile discharge. He has a history of chronic epididymitis. He was seen yesterday for the same. Was discharged with Levaquin and Percocet. He states that he has been taking the medication, but still complains of pain. He states that he has a follow-up appointment with the urologist on Tuesday. His last dialysis was today.  The history is provided by the patient. No language interpreter was used.    Past Medical History  Diagnosis Date  . Hypertension   . Depression   . Complication of anesthesia     itching, sore throat  . Diabetes mellitus without complication ([place])     No history per patient, but remains under history as A1c would not be accurate given on dialysis  . Shortness of breath   . Anxiety   . ESRD (end stage renal disease) ([place])     due to HTN per patient, followed at [facility], s/p failed kidney transplant - dialysis Tue, Th, Sat  . Renal insufficiency   . CHF (congestive heart failure) ([place])   . Anemia   . Dialysis patient [facility])    Past Surgical History  Procedure Laterality Date  . Kidney receipient  2006    failed and started HD in March 2014  . Capd insertion    . Capd removal    . Left heart catheterization with coronary angiogram N/A 09/02/2014    Procedure: LEFT HEART CATHETERIZATION WITH CORONARY ANGIOGRAM;  Surgeon: [name], MD;  Location: [facility] CATH LAB;  Service: Cardiovascular;  Laterality: N/A;  . Inguinal hernia repair Right 02/14/2015    Procedure: REPAIR INCARCERATED RIGHT INGUINAL  HERNIA;  Surgeon: [name], MD;  Location: [place];  Service: General;  Laterality: Right;   No family history on file. Social History  Substance Use Topics  . Smoking status: Former Smoker -- 0.00 packs/day for 1 years    Types: Cigarettes  . Smokeless tobacco: Never Used     Comment: quit Jan 2014  . Alcohol Use: No    Review of Systems  Constitutional: Negative for fever and chills.  Respiratory: Negative for shortness of breath.   Cardiovascular: Negative for chest pain.  Gastrointestinal: Negative for nausea, vomiting, diarrhea and constipation.  Genitourinary: Positive for discharge and testicular pain. Negative for dysuria.  All other systems reviewed and are negative.     Allergies  Ferrlecit and Darvocet  Home Medications   Prior to Admission medications   Medication Sig Start Date End Date Taking? Authorizing Provider  ALPRAZolam [name]) 0.5 MG tablet Take 0.5 mg by mouth 2 (two) times daily as needed for anxiety.   Yes Historical Provider, MD  amLODipine (NORVASC) 10 MG tablet Take 10 mg by mouth daily.   Yes Historical Provider, MD  atorvastatin (LIPITOR) 40 MG tablet Take 1 tablet (40 mg total) by mouth daily at 6 PM. 09/02/14  Yes Barton Dubois, MD  carvedilol (COREG) 25 MG tablet Take 1 tablet (25 mg total) by mouth 2 (two) times daily with a meal. 10/10/14  Yes Mechele Claude, DO  cinacalcet (SENSIPAR) 30 MG  tablet Take 30 mg by mouth daily.   Yes Historical Provider, MD  cloNIDine (CATAPRES - DOSED IN MG/24 HR) 0.1 mg/24hr patch Place 0.1 mg onto the skin once a week.   Yes Historical Provider, MD  docusate sodium (COLACE) 100 MG capsule Take 1 capsule (100 mg total) by mouth 2 (two) times daily. 11/19/14  Yes [name], MD  doxazosin (CARDURA) 2 MG tablet Take 2 mg by mouth daily. 06/14/15 09/12/15 Yes Historical Provider, MD  doxercalciferol (HECTOROL) 4 MCG/2ML injection Inject 1.25 mLs (2.5 mcg total) into the vein Every Tuesday,Thursday,and  Saturday with dialysis. 10/10/14  Yes Penelope Coop Vann, DO  gabapentin (NEURONTIN) 100 MG capsule Take 100 mg by mouth 3 (three) times daily. 06/14/15 09/12/15 Yes Historical Provider, MD  hydrALAZINE (APRESOLINE) 50 MG tablet Take 1 tablet (50 mg total) by mouth every 8 (eight) hours. 02/04/15  Yes [name], MD  hydrocerin (EUCERIN) CREA Apply 1 application topically 2 (two) times daily. 02/04/15  Yes [name], MD  isosorbide mononitrate (IMDUR) 60 MG 24 hr tablet Take 60 mg by mouth daily. 06/14/15 09/12/15 Yes Historical Provider, MD  levalbuterol Penne Lash HFA) 45 MCG/ACT inhaler Inhale 2 puffs into the lungs every 6 (six) hours as needed for wheezing or shortness of breath. 11/19/14  Yes [name], MD  levofloxacin (LEVAQUIN) 750 MG tablet Take 1 tablet (750 mg total) by mouth daily. X 7 days 06/30/15  Yes Veryl Speak, MD  lisinopril (PRINIVIL,ZESTRIL) 20 MG tablet Take 1 tablet (20 mg total) by mouth daily. 02/04/15  Yes [name], MD  methocarbamol (ROBAXIN) 500 MG tablet Take 2 tablets (1,000 mg total) by mouth every 8 (eight) hours as needed for muscle spasms. 01/26/15  Yes [name], MD  multivitamin (RENA-VIT) TABS tablet Take 1 tablet by mouth at bedtime. 02/04/15  Yes [name], MD  omeprazole (PRILOSEC) 20 MG capsule Take 20 mg by mouth daily. 07/01/13  Yes Historical Provider, MD  ondansetron (ZOFRAN-ODT) 4 MG disintegrating tablet Take 1 tablet (4 mg total) by mouth every 8 (eight) hours as needed for nausea or vomiting. 12/05/14  Yes Verlee Monte, MD  oxyCODONE-acetaminophen (PERCOCET) 5-325 MG tablet Take 1-2 tablets by mouth every 6 (six) hours as needed. 06/30/15  Yes Veryl Speak, MD  predniSONE (DELTASONE) 5 MG tablet Take 5 mg by mouth daily with breakfast.   Yes Historical Provider, MD  sevelamer carbonate (RENVELA) 800 MG tablet Take 800-1,600 mg by mouth 3 (three) times daily with meals. 2 tabs three times daily with meals, and 1 tablet with snacks   Yes  Historical Provider, MD  warfarin (COUMADIN) 6 MG tablet Take 1 tablet (6 mg total) by mouth daily at 6 PM. Take daily until adjusted per MD 02/17/15  Yes Eugenie Filler, MD   BP 188/136 mmHg  Pulse 98  Temp(Src) 98.4 F (36.9 C) (Oral)  Resp 16  SpO2 98% Physical Exam  Constitutional: He is oriented to person, place, and time. He appears well-developed and well-nourished.  HENT:  Head: Normocephalic and atraumatic.  Eyes: Conjunctivae and EOM are normal. Pupils are equal, round, and reactive to light. Right eye exhibits no discharge. Left eye exhibits no discharge. No scleral icterus.  Neck: Normal range of motion. Neck supple. No JVD present.  Cardiovascular: Normal rate, regular rhythm and normal heart sounds.  Exam reveals no gallop and no friction rub.   No murmur heard. Pulmonary/Chest: Effort normal and breath sounds normal. No respiratory  distress. He has no wheezes. He has no rales. He exhibits no tenderness.  Abdominal: Soft. He exhibits no distension and no mass. There is no tenderness. There is no rebound and no guarding.  Genitourinary:  Normal uncircumcised penis, no discharge, R testicle ttp, but not swollen, smooth, no masses, no evidence of abscess, cellulitis, or other infection, no other abnormality or deformity  Musculoskeletal: Normal range of motion. He exhibits no edema or tenderness.  Neurological: He is alert and oriented to person, place, and time.  Skin: Skin is warm and dry.  Psychiatric: He has a normal mood and affect. His behavior is normal. Judgment and thought content normal.  Nursing note and vitals reviewed.   ED Course  Procedures (including critical care time)   MDM   Final diagnoses:  Chronic epididymitis  Groin pain, chronic, right    Patient with chronic epididymitis.  Seen yesterday for the same.  Prior notes concerning for doctor shopping and drug seeking behavior.  Symptoms tonight are consistent with prior episodes of epididymitis.   Will give a dose of percocet and zofran here.  He has percocet and levaquin at home.  Completed HD this morning and states that his mouth is dry.  Will give glass of water and DC.  Discussed briefly with Dr. Alvino Chapel, who agrees with plan.    Montine Circle, PA-C 07/01/15 2119  Davonna Belling, MD 07/01/15 [phone]

## 2015-07-01 NOTE — ED Notes (Signed)
Pt called out and this RN went into pt's room. Pt sitting on the edge of the bed stating "I need something for nausea, I need phenergen"

## 2015-07-01 NOTE — ED Notes (Signed)
Per [facility], pt from home, has R chest PICC for dialysis, about 7am this morning had dialysis, pain in lower abdomen and scrotum, intermittent blood in urine. Been having this issue for months, seen for the same thing yesterday. Dialysis completed today. Told to come here for evaluation of symptoms.

## 2015-07-17 ENCOUNTER — Encounter (HOSPITAL_COMMUNITY): Payer: Self-pay | Admitting: Emergency Medicine

## 2015-07-17 ENCOUNTER — Emergency Department (HOSPITAL_COMMUNITY): Payer: Medicare Other

## 2015-07-17 DIAGNOSIS — F419 Anxiety disorder, unspecified: Secondary | ICD-10-CM | POA: Diagnosis not present

## 2015-07-17 DIAGNOSIS — Z7952 Long term (current) use of systemic steroids: Secondary | ICD-10-CM | POA: Insufficient documentation

## 2015-07-17 DIAGNOSIS — E119 Type 2 diabetes mellitus without complications: Secondary | ICD-10-CM | POA: Insufficient documentation

## 2015-07-17 DIAGNOSIS — Z87891 Personal history of nicotine dependence: Secondary | ICD-10-CM | POA: Diagnosis not present

## 2015-07-17 DIAGNOSIS — R0602 Shortness of breath: Secondary | ICD-10-CM | POA: Diagnosis not present

## 2015-07-17 DIAGNOSIS — Z79899 Other long term (current) drug therapy: Secondary | ICD-10-CM | POA: Insufficient documentation

## 2015-07-17 DIAGNOSIS — Z862 Personal history of diseases of the blood and blood-forming organs and certain disorders involving the immune mechanism: Secondary | ICD-10-CM | POA: Insufficient documentation

## 2015-07-17 DIAGNOSIS — N186 End stage renal disease: Secondary | ICD-10-CM | POA: Diagnosis not present

## 2015-07-17 DIAGNOSIS — I509 Heart failure, unspecified: Secondary | ICD-10-CM | POA: Diagnosis not present

## 2015-07-17 DIAGNOSIS — F329 Major depressive disorder, single episode, unspecified: Secondary | ICD-10-CM | POA: Insufficient documentation

## 2015-07-17 DIAGNOSIS — Z9889 Other specified postprocedural states: Secondary | ICD-10-CM | POA: Insufficient documentation

## 2015-07-17 DIAGNOSIS — I120 Hypertensive chronic kidney disease with stage 5 chronic kidney disease or end stage renal disease: Secondary | ICD-10-CM | POA: Diagnosis not present

## 2015-07-17 DIAGNOSIS — R079 Chest pain, unspecified: Secondary | ICD-10-CM | POA: Diagnosis present

## 2015-07-17 DIAGNOSIS — G8929 Other chronic pain: Secondary | ICD-10-CM | POA: Diagnosis not present

## 2015-07-17 DIAGNOSIS — Z992 Dependence on renal dialysis: Secondary | ICD-10-CM | POA: Insufficient documentation

## 2015-07-17 DIAGNOSIS — Z7901 Long term (current) use of anticoagulants: Secondary | ICD-10-CM | POA: Insufficient documentation

## 2015-07-17 LAB — I-STAT TROPONIN, ED: TROPONIN I, POC: 0.05 ng/mL (ref 0.00–0.08)

## 2015-07-17 LAB — BASIC METABOLIC PANEL
Anion gap: 12 (ref 5–15)
BUN: 46 mg/dL — ABNORMAL HIGH (ref 6–20)
CALCIUM: 8.3 mg/dL — AB (ref 8.9–10.3)
CHLORIDE: 104 mmol/L (ref 101–111)
CO2: 24 mmol/L (ref 22–32)
CREATININE: 8.82 mg/dL — AB (ref 0.61–1.24)
GFR calc Af Amer: 7 mL/min — ABNORMAL LOW (ref >60)
GFR, EST NON AFRICAN AMERICAN: 6 mL/min — AB (ref >60)
Glucose, Bld: 93 mg/dL (ref 65–99)
POTASSIUM: 4.7 mmol/L (ref 3.5–5.1)
Sodium: 140 mmol/L (ref 135–145)

## 2015-07-17 LAB — CBC
HCT: 23.4 % — ABNORMAL LOW (ref 39.0–52.0)
Hemoglobin: 7.4 g/dL — ABNORMAL LOW (ref 13.0–17.0)
MCH: 27.7 pg (ref 26.0–34.0)
MCHC: 31.6 g/dL (ref 30.0–36.0)
MCV: 87.6 fL (ref 78.0–100.0)
PLATELETS: 233 10*3/uL (ref 150–400)
RBC: 2.67 MIL/uL — AB (ref 4.22–5.81)
RDW: 16.8 % — ABNORMAL HIGH (ref 11.5–15.5)
WBC: 6.1 10*3/uL (ref 4.0–10.5)

## 2015-07-17 LAB — BRAIN NATRIURETIC PEPTIDE

## 2015-07-17 NOTE — ED Notes (Signed)
Pt reports midsternal cp with sob and cough x 4-5 hours. Pt takes dialysis tues, thurs, sat.

## 2015-07-18 ENCOUNTER — Emergency Department (HOSPITAL_COMMUNITY)
Admission: EM | Admit: 2015-07-18 | Discharge: 2015-07-18 | Disposition: A | Discharge status: Home / Self Care | Payer: Medicare Other | Attending: Emergency Medicine | Admitting: Emergency Medicine

## 2015-07-18 DIAGNOSIS — G8929 Other chronic pain: Secondary | ICD-10-CM

## 2015-07-18 LAB — PROTIME-INR
INR: 1.18 (ref 0.00–1.49)
Prothrombin Time: 15.2 seconds (ref 11.6–15.2)

## 2015-07-18 MED ORDER — HYDROCODONE-ACETAMINOPHEN 5-325 MG PO TABS: 1.0000 | ORAL_TABLET | Freq: Two times a day (BID) | ORAL | Status: DC | PRN

## 2015-07-18 NOTE — ED Notes (Signed)
Pt A&OX4, ambulatory at d/c with steady gait, NAD 

## 2015-07-18 NOTE — ED Provider Notes (Signed)
CSN: [id number]     Arrival date & time 07/17/15  2058 History  By signing my name below, I, Helane Gunther, attest that this documentation has been prepared under the direction and in the presence of Everlene Balls, MD. Electronically Signed: Helane Gunther, ED Scribe. 07/18/2015. 2:05 AM.    Chief Complaint  Patient presents with  . Chest Pain  . Shortness of Breath   The history is provided by the patient. No language interpreter was used.   HPI Comments: AABAN GRIEP is a 49 y.o. male former smoker with a PMHx of CHF, HTN, DM, SOB, ESRD, renal insufficiency, anemia, and a dialysis patient (17 years) who presents to the Emergency Department complaining of constant, throbbing chest pain and SOB onset 5 hours ago. He reports associated cough. He notes exacerbation of the pain with breathing. He notes his last dialysis session was 3 days ago, and that he has been going to them regularly. He notes he is easily susceptible to catching an illness from other people. He notes he is taking coumadin due to a PMHx of PE, diagnosed in May of this year. He states that he was told 3 days ago during his dialysis that his coumadin levels were low. Pt denies n/v, diaphoresis, rhinorrhea, congestion, and fever.   Past Medical History  Diagnosis Date  . Hypertension   . Depression   . Complication of anesthesia     itching, sore throat  . Diabetes mellitus without complication ([place])     No history per patient, but remains under history as A1c would not be accurate given on dialysis  . Shortness of breath   . Anxiety   . ESRD (end stage renal disease) ([place])     due to HTN per patient, followed at [facility], s/p failed kidney transplant - dialysis Tue, Th, Sat  . Renal insufficiency   . CHF (congestive heart failure) ([place])   . Anemia   . Dialysis patient [facility])    Past Surgical History  Procedure Laterality Date  . Kidney receipient  2006    failed and started HD in March 2014  . Capd insertion    .  Capd removal    . Left heart catheterization with coronary angiogram N/A 09/02/2014    Procedure: LEFT HEART CATHETERIZATION WITH CORONARY ANGIOGRAM;  Surgeon: [name], MD;  Location: [facility] [place] CATH LAB;  Service: Cardiovascular;  Laterality: N/A;  . Inguinal hernia repair Right 02/14/2015    Procedure: REPAIR INCARCERATED RIGHT INGUINAL HERNIA;  Surgeon: [name], MD;  Location: [place];  Service: General;  Laterality: Right;   No family history on file. Social History  Substance Use Topics  . Smoking status: Former Smoker -- 0.00 packs/day for 1 years    Types: Cigarettes  . Smokeless tobacco: Never Used     Comment: quit Jan 2014  . Alcohol Use: No    Review of Systems A complete 10 system review of systems was obtained and all systems are negative except as noted in the HPI and PMH.   Allergies  Ferrlecit and Darvocet  Home Medications   Prior to Admission medications   Medication Sig Start Date End Date Taking? Authorizing Provider  ALPRAZolam [name]) 0.5 MG tablet Take 0.5 mg by mouth 2 (two) times daily as needed for anxiety.   Yes Historical Provider, MD  amLODipine (NORVASC) 10 MG tablet Take 10 mg by mouth daily.   Yes Historical Provider, MD  atorvastatin (LIPITOR) 40 MG tablet Take 1 tablet (  40 mg total) by mouth daily at 6 PM. 09/02/14  Yes Barton Dubois, MD  carvedilol (COREG) 25 MG tablet Take 1 tablet (25 mg total) by mouth 2 (two) times daily with a meal. 10/10/14  Yes [name], DO  cinacalcet (SENSIPAR) 30 MG tablet Take 30 mg by mouth daily.   Yes Historical Provider, MD  cloNIDine (CATAPRES - DOSED IN MG/24 HR) 0.1 mg/24hr patch Place 0.1 mg onto the skin once a week.   Yes Historical Provider, MD  docusate sodium (COLACE) 100 MG capsule Take 1 capsule (100 mg total) by mouth 2 (two) times daily. 11/19/14  Yes [name], MD  doxazosin (CARDURA) 2 MG tablet Take 2 mg by mouth daily. 06/14/15 09/12/15 Yes Historical Provider, MD  doxercalciferol (HECTOROL) 4  MCG/2ML injection Inject 1.25 mLs (2.5 mcg total) into the vein Every Tuesday,Thursday,and Saturday with dialysis. 10/10/14  Yes [name], DO  gabapentin (NEURONTIN) 100 MG capsule Take 100 mg by mouth 3 (three) times daily. 06/14/15 09/12/15 Yes Historical Provider, MD  hydrALAZINE (APRESOLINE) 50 MG tablet Take 1 tablet (50 mg total) by mouth every 8 (eight) hours. 02/04/15  Yes [name], MD  hydrocerin (EUCERIN) CREA Apply 1 application topically 2 (two) times daily. 02/04/15  Yes [name], MD  isosorbide mononitrate (IMDUR) 60 MG 24 hr tablet Take 60 mg by mouth daily. 06/14/15 09/12/15 Yes Historical Provider, MD  levalbuterol Penne Lash HFA) 45 MCG/ACT inhaler Inhale 2 puffs into the lungs every 6 (six) hours as needed for wheezing or shortness of breath. 11/19/14  Yes [name], MD  lisinopril (PRINIVIL,ZESTRIL) 20 MG tablet Take 1 tablet (20 mg total) by mouth daily. 02/04/15  Yes [name], MD  methocarbamol (ROBAXIN) 500 MG tablet Take 2 tablets (1,000 mg total) by mouth every 8 (eight) hours as needed for muscle spasms. 01/26/15  Yes [name], MD  multivitamin (RENA-VIT) TABS tablet Take 1 tablet by mouth at bedtime. 02/04/15  Yes [name], MD  omeprazole (PRILOSEC) 20 MG capsule Take 20 mg by mouth daily. 07/01/13  Yes Historical Provider, MD  ondansetron (ZOFRAN-ODT) 4 MG disintegrating tablet Take 1 tablet (4 mg total) by mouth every 8 (eight) hours as needed for nausea or vomiting. 12/05/14  Yes Verlee Monte, MD  oxyCODONE-acetaminophen (PERCOCET) 5-325 MG tablet Take 1-2 tablets by mouth every 6 (six) hours as needed. Patient taking differently: Take 1-2 tablets by mouth every 6 (six) hours as needed for moderate pain.  06/30/15  Yes Veryl Speak, MD  predniSONE (DELTASONE) 5 MG tablet Take 5 mg by mouth daily with breakfast.   Yes Historical Provider, MD  sevelamer carbonate (RENVELA) 800 MG tablet Take 800-1,600 mg by mouth 3 (three) times daily with meals. 2  tabs three times daily with meals, and 1 tablet with snacks   Yes Historical Provider, MD  warfarin (COUMADIN) 6 MG tablet Take 1 tablet (6 mg total) by mouth daily at 6 PM. Take daily until adjusted per MD 02/17/15  Yes Eugenie Filler, MD   BP 181/133 mmHg  Pulse 91  Temp(Src) 98 F (36.7 C) (Oral)  Resp 18  Ht _0  (1.88 m)  Wt 174 lb 12.8 oz (79.289 kg)  BMI 22.43 kg/m2  SpO2 100% Physical Exam  Constitutional: He is oriented to person, place, and time. Vital signs are normal. He appears well-developed and well-nourished.  Non-toxic appearance. He does not appear ill. No distress.  HENT:  Head: Normocephalic  and atraumatic.  Nose: Nose normal.  Mouth/Throat: Oropharynx is clear and moist. No oropharyngeal exudate.  Eyes: Conjunctivae and EOM are normal. Pupils are equal, round, and reactive to light. No scleral icterus.  Neck: Normal range of motion. Neck supple. No tracheal deviation, no edema, no erythema and normal range of motion present. No thyroid mass and no thyromegaly present.  Cardiovascular: Normal rate, regular rhythm, S1 normal, S2 normal, normal heart sounds, intact distal pulses and normal pulses.  Exam reveals no gallop and no friction rub.   No murmur heard. Pulmonary/Chest: Effort normal and breath sounds normal. No respiratory distress. He has no wheezes. He has no rhonchi. He has no rales.  Abdominal: Soft. Normal appearance and bowel sounds are normal. He exhibits no distension, no ascites and no mass. There is no hepatosplenomegaly. There is no tenderness. There is no rebound, no guarding and no CVA tenderness.  Musculoskeletal: Normal range of motion. He exhibits no edema or tenderness.  Lymphadenopathy:    He has no cervical adenopathy.  Neurological: He is alert and oriented to person, place, and time. He has normal strength. No cranial nerve deficit or sensory deficit.  Skin: Skin is warm, dry and intact. No petechiae and no rash noted. He is not  diaphoretic. No erythema. No pallor.  Psychiatric: He has a normal mood and affect. His behavior is normal. Judgment normal.  Nursing note and vitals reviewed.   ED Course  Procedures  DIAGNOSTIC STUDIES: Oxygen Saturation is 100% on RA, normal by my interpretation.    COORDINATION OF CARE: 1:47 AM - Discussed plans to order diagnostic studies. Pt advised of plan for treatment and pt agrees.  Labs Review Labs Reviewed  BASIC METABOLIC PANEL - Abnormal; Notable for the following:    BUN 46 (*)    Creatinine, Ser 8.82 (*)    Calcium 8.3 (*)    GFR calc non Af Amer 6 (*)    GFR calc Af Amer 7 (*)    All other components within normal limits  CBC - Abnormal; Notable for the following:    RBC 2.67 (*)    Hemoglobin 7.4 (*)    HCT 23.4 (*)    RDW 16.8 (*)    All other components within normal limits  BRAIN NATRIURETIC PEPTIDE - Abnormal; Notable for the following:    B Natriuretic Peptide >4500.0 (*)    All other components within normal limits  PROTIME-INR  I-STAT TROPOININ, ED    Imaging Review Dg Chest 2 View  07/17/2015  CLINICAL DATA:  Chest pain and cough EXAM: CHEST  2 VIEW COMPARISON:  02/10/2015 FINDINGS: Dual-lumen dialysis catheter in the right atrium unchanged. Cardiac enlargement with mild vascular congestion. Small pleural effusions bilaterally. Negative for pneumonia. IMPRESSION: Mild vascular congestion with small pleural effusions. Findings consistent with mild fluid overload. Electronically Signed   By: Franchot Gallo M.D.   On: 07/17/2015 21:31   I have personally reviewed and evaluated these images and lab results as part of my medical decision-making.   EKG Interpretation   Date/Time:  Monday July 17 2015 21:02:41 EST Ventricular Rate:  83 PR Interval:  174 QRS Duration: 84 QT Interval:  406 QTC Calculation: 477 R Axis:   -41 Text Interpretation:  Normal sinus rhythm Left axis deviation Cannot rule  out Anterior infarct , age undetermined  Abnormal ECG No significant change  since last tracing Confirmed by Glynn Octave [phone]) on  07/18/2015 1:21:23 AM      MDM  Final diagnoses:  None   Patient presents to emergency department for shoulder pain. His pain is chronic in nature. He has history of multiple visits to the emergency department requesting pain medication. I had a long discussion with the patient regarding his abuse of narcotics.  He states that his other doctors instruct him to go to the ED for more pain medications.  He was informed of withdrawal pain from narcotics.  Will provide 8 tabs of norco 46m to last him to get to his PCP.  He appears well and in NAD.  No indications for emergent dialysis.  VS remain within his normal limits and he is safe for DC.    I personally performed the services described in this documentation, which was scribed in my presence. The recorded information has been reviewed and is accurate.     AEverlene Balls MD 07/18/15 [phone]

## 2015-07-18 NOTE — ED Notes (Signed)
Dr. Claudine Mouton at bedside.

## 2015-07-18 NOTE — Discharge Instructions (Signed)
Chronic Pain Frank [name], you need to find a pain clinic to help you with your chronic pain.  For any worsening symptoms, come back to the ED immediately.  Thank you. Chronic pain can be defined as pain that is off and on and lasts for 3-6 months or longer. Many things cause chronic pain, which can make it difficult to make a diagnosis. There are many treatment options available for chronic pain. However, finding a treatment that works well for you may require trying various approaches until the right one is found. Many people benefit from a combination of two or more types of treatment to control their pain. SYMPTOMS  Chronic pain can occur anywhere in the body and can range from mild to very severe. Some types of chronic pain include:  Headache.  Low back pain.  Cancer pain.  Arthritis pain.  Neurogenic pain. This is pain resulting from damage to nerves. People with chronic pain may also have other symptoms such as:  Depression.  Anger.  Insomnia.  Anxiety. DIAGNOSIS  Your health care provider will help diagnose your condition over time. In many cases, the initial focus will be on excluding possible conditions that could be causing the pain. Depending on your symptoms, your health care provider may order tests to diagnose your condition. Some of these tests may include:   Blood tests.   CT scan.   MRI.   X-rays.   Ultrasounds.   Nerve conduction studies.  You may need to see a specialist.  TREATMENT  Finding treatment that works well may take time. You may be referred to a pain specialist. He or she may prescribe medicine or therapies, such as:   Mindful meditation or yoga.  Shots (injections) of numbing or pain-relieving medicines into the spine or area of pain.  Local electrical stimulation.  Acupuncture.   Massage therapy.   Aroma, color, light, or sound therapy.   Biofeedback.   Working with a physical therapist to keep from getting stiff.    Regular, gentle exercise.   Cognitive or behavioral therapy.   Group support.  Sometimes, surgery may be recommended.  HOME CARE INSTRUCTIONS   Take all medicines as directed by your health care provider.   Lessen stress in your life by relaxing and doing things such as listening to calming music.   Exercise or be active as directed by your health care provider.   Eat a healthy diet and include things such as vegetables, fruits, fish, and lean meats in your diet.   Keep all follow-up appointments with your health care provider.   Attend a support group with others suffering from chronic pain. SEEK MEDICAL CARE IF:   Your pain gets worse.   You develop a new pain that was not there before.   You cannot tolerate medicines given to you by your health care provider.   You have new symptoms since your last visit with your health care provider.  SEEK IMMEDIATE MEDICAL CARE IF:   You feel weak.   You have decreased sensation or numbness.   You lose control of bowel or bladder function.   Your pain suddenly gets much worse.   You develop shaking.  You develop chills.  You develop confusion.  You develop chest pain.  You develop shortness of breath.  MAKE SURE YOU:  Understand these instructions.  Will watch your condition.  Will get help right away if you are not doing well or get worse.   This information is not  intended to replace advice given to you by your health care provider. Make sure you discuss any questions you have with your health care provider.   Document Released: 03/30/2002 Document Revised: 03/10/2013 Document Reviewed: 01/01/2013 Elsevier Interactive Patient Education [employer].

## 2015-07-24 DIAGNOSIS — N186 End stage renal disease: Secondary | ICD-10-CM | POA: Diagnosis not present

## 2015-07-24 DIAGNOSIS — D631 Anemia in chronic kidney disease: Secondary | ICD-10-CM | POA: Diagnosis not present

## 2015-07-24 DIAGNOSIS — I10 Essential (primary) hypertension: Secondary | ICD-10-CM | POA: Diagnosis not present

## 2015-07-24 DIAGNOSIS — N2581 Secondary hyperparathyroidism of renal origin: Secondary | ICD-10-CM | POA: Diagnosis not present

## 2015-07-24 DIAGNOSIS — Z418 Encounter for other procedures for purposes other than remedying health state: Secondary | ICD-10-CM | POA: Diagnosis not present

## 2015-07-27 DIAGNOSIS — N186 End stage renal disease: Secondary | ICD-10-CM | POA: Diagnosis not present

## 2015-07-27 DIAGNOSIS — N2581 Secondary hyperparathyroidism of renal origin: Secondary | ICD-10-CM | POA: Diagnosis not present

## 2015-07-27 DIAGNOSIS — D631 Anemia in chronic kidney disease: Secondary | ICD-10-CM | POA: Diagnosis not present

## 2015-07-27 DIAGNOSIS — I10 Essential (primary) hypertension: Secondary | ICD-10-CM | POA: Diagnosis not present

## 2015-07-27 DIAGNOSIS — Z418 Encounter for other procedures for purposes other than remedying health state: Secondary | ICD-10-CM | POA: Diagnosis not present

## 2015-07-27 DIAGNOSIS — E119 Type 2 diabetes mellitus without complications: Secondary | ICD-10-CM | POA: Diagnosis not present

## 2015-07-27 DIAGNOSIS — Z7901 Long term (current) use of anticoagulants: Secondary | ICD-10-CM | POA: Diagnosis not present

## 2015-07-29 DIAGNOSIS — N2581 Secondary hyperparathyroidism of renal origin: Secondary | ICD-10-CM | POA: Diagnosis not present

## 2015-07-29 DIAGNOSIS — Z418 Encounter for other procedures for purposes other than remedying health state: Secondary | ICD-10-CM | POA: Diagnosis not present

## 2015-07-29 DIAGNOSIS — N186 End stage renal disease: Secondary | ICD-10-CM | POA: Diagnosis not present

## 2015-07-29 DIAGNOSIS — I10 Essential (primary) hypertension: Secondary | ICD-10-CM | POA: Diagnosis not present

## 2015-07-29 DIAGNOSIS — D631 Anemia in chronic kidney disease: Secondary | ICD-10-CM | POA: Diagnosis not present

## 2015-08-03 DIAGNOSIS — Z7901 Long term (current) use of anticoagulants: Secondary | ICD-10-CM | POA: Diagnosis not present

## 2015-08-03 DIAGNOSIS — Z418 Encounter for other procedures for purposes other than remedying health state: Secondary | ICD-10-CM | POA: Diagnosis not present

## 2015-08-03 DIAGNOSIS — N2581 Secondary hyperparathyroidism of renal origin: Secondary | ICD-10-CM | POA: Diagnosis not present

## 2015-08-03 DIAGNOSIS — N186 End stage renal disease: Secondary | ICD-10-CM | POA: Diagnosis not present

## 2015-08-03 DIAGNOSIS — D631 Anemia in chronic kidney disease: Secondary | ICD-10-CM | POA: Diagnosis not present

## 2015-08-03 DIAGNOSIS — I10 Essential (primary) hypertension: Secondary | ICD-10-CM | POA: Diagnosis not present

## 2015-08-04 DIAGNOSIS — Z940 Kidney transplant status: Secondary | ICD-10-CM | POA: Diagnosis not present

## 2015-08-04 DIAGNOSIS — I5022 Chronic systolic (congestive) heart failure: Secondary | ICD-10-CM | POA: Diagnosis not present

## 2015-08-04 DIAGNOSIS — R935 Abnormal findings on diagnostic imaging of other abdominal regions, including retroperitoneum: Secondary | ICD-10-CM | POA: Diagnosis not present

## 2015-08-04 DIAGNOSIS — I313 Pericardial effusion (noninflammatory): Secondary | ICD-10-CM | POA: Diagnosis not present

## 2015-08-04 DIAGNOSIS — Z992 Dependence on renal dialysis: Secondary | ICD-10-CM | POA: Diagnosis not present

## 2015-08-04 DIAGNOSIS — R0602 Shortness of breath: Secondary | ICD-10-CM | POA: Diagnosis not present

## 2015-08-04 DIAGNOSIS — Z418 Encounter for other procedures for purposes other than remedying health state: Secondary | ICD-10-CM | POA: Diagnosis not present

## 2015-08-04 DIAGNOSIS — I517 Cardiomegaly: Secondary | ICD-10-CM | POA: Diagnosis not present

## 2015-08-04 DIAGNOSIS — N186 End stage renal disease: Secondary | ICD-10-CM | POA: Diagnosis not present

## 2015-08-04 DIAGNOSIS — I10 Essential (primary) hypertension: Secondary | ICD-10-CM | POA: Diagnosis not present

## 2015-08-04 DIAGNOSIS — D631 Anemia in chronic kidney disease: Secondary | ICD-10-CM | POA: Diagnosis not present

## 2015-08-04 DIAGNOSIS — R1084 Generalized abdominal pain: Secondary | ICD-10-CM | POA: Diagnosis not present

## 2015-08-04 DIAGNOSIS — N2581 Secondary hyperparathyroidism of renal origin: Secondary | ICD-10-CM | POA: Diagnosis not present

## 2015-08-04 DIAGNOSIS — I120 Hypertensive chronic kidney disease with stage 5 chronic kidney disease or end stage renal disease: Secondary | ICD-10-CM | POA: Diagnosis not present

## 2015-08-04 DIAGNOSIS — I132 Hypertensive heart and chronic kidney disease with heart failure and with stage 5 chronic kidney disease, or end stage renal disease: Secondary | ICD-10-CM | POA: Diagnosis not present

## 2015-08-05 DIAGNOSIS — Z992 Dependence on renal dialysis: Secondary | ICD-10-CM | POA: Diagnosis not present

## 2015-08-05 DIAGNOSIS — R1084 Generalized abdominal pain: Secondary | ICD-10-CM | POA: Diagnosis not present

## 2015-08-05 DIAGNOSIS — E8770 Fluid overload, unspecified: Secondary | ICD-10-CM | POA: Diagnosis not present

## 2015-08-05 DIAGNOSIS — N186 End stage renal disease: Secondary | ICD-10-CM | POA: Diagnosis not present

## 2015-08-05 DIAGNOSIS — R0602 Shortness of breath: Secondary | ICD-10-CM | POA: Diagnosis not present

## 2015-08-05 DIAGNOSIS — I517 Cardiomegaly: Secondary | ICD-10-CM | POA: Diagnosis not present

## 2015-08-05 DIAGNOSIS — I120 Hypertensive chronic kidney disease with stage 5 chronic kidney disease or end stage renal disease: Secondary | ICD-10-CM | POA: Diagnosis not present

## 2015-08-06 DIAGNOSIS — Z992 Dependence on renal dialysis: Secondary | ICD-10-CM | POA: Diagnosis not present

## 2015-08-06 DIAGNOSIS — N2889 Other specified disorders of kidney and ureter: Secondary | ICD-10-CM | POA: Diagnosis present

## 2015-08-06 DIAGNOSIS — Z7901 Long term (current) use of anticoagulants: Secondary | ICD-10-CM | POA: Diagnosis not present

## 2015-08-06 DIAGNOSIS — Z86718 Personal history of other venous thrombosis and embolism: Secondary | ICD-10-CM | POA: Diagnosis not present

## 2015-08-06 DIAGNOSIS — R109 Unspecified abdominal pain: Secondary | ICD-10-CM | POA: Diagnosis not present

## 2015-08-06 DIAGNOSIS — D649 Anemia, unspecified: Secondary | ICD-10-CM | POA: Diagnosis not present

## 2015-08-06 DIAGNOSIS — I5022 Chronic systolic (congestive) heart failure: Secondary | ICD-10-CM | POA: Diagnosis present

## 2015-08-06 DIAGNOSIS — Z841 Family history of disorders of kidney and ureter: Secondary | ICD-10-CM | POA: Diagnosis not present

## 2015-08-06 DIAGNOSIS — G8929 Other chronic pain: Secondary | ICD-10-CM | POA: Diagnosis present

## 2015-08-06 DIAGNOSIS — G4733 Obstructive sleep apnea (adult) (pediatric): Secondary | ICD-10-CM | POA: Diagnosis present

## 2015-08-06 DIAGNOSIS — N186 End stage renal disease: Secondary | ICD-10-CM | POA: Diagnosis not present

## 2015-08-06 DIAGNOSIS — I120 Hypertensive chronic kidney disease with stage 5 chronic kidney disease or end stage renal disease: Secondary | ICD-10-CM | POA: Diagnosis not present

## 2015-08-06 DIAGNOSIS — Z856 Personal history of leukemia: Secondary | ICD-10-CM | POA: Diagnosis not present

## 2015-08-06 DIAGNOSIS — E8770 Fluid overload, unspecified: Secondary | ICD-10-CM | POA: Diagnosis not present

## 2015-08-06 DIAGNOSIS — I313 Pericardial effusion (noninflammatory): Secondary | ICD-10-CM | POA: Diagnosis present

## 2015-08-06 DIAGNOSIS — R0602 Shortness of breath: Secondary | ICD-10-CM | POA: Diagnosis not present

## 2015-08-06 DIAGNOSIS — M549 Dorsalgia, unspecified: Secondary | ICD-10-CM | POA: Diagnosis not present

## 2015-08-06 DIAGNOSIS — Z833 Family history of diabetes mellitus: Secondary | ICD-10-CM | POA: Diagnosis not present

## 2015-08-06 DIAGNOSIS — Z87891 Personal history of nicotine dependence: Secondary | ICD-10-CM | POA: Diagnosis not present

## 2015-08-06 DIAGNOSIS — I132 Hypertensive heart and chronic kidney disease with heart failure and with stage 5 chronic kidney disease, or end stage renal disease: Secondary | ICD-10-CM | POA: Diagnosis present

## 2015-08-06 DIAGNOSIS — K219 Gastro-esophageal reflux disease without esophagitis: Secondary | ICD-10-CM | POA: Diagnosis present

## 2015-08-06 DIAGNOSIS — Z9119 Patient's noncompliance with other medical treatment and regimen: Secondary | ICD-10-CM | POA: Diagnosis not present

## 2015-08-06 DIAGNOSIS — Z809 Family history of malignant neoplasm, unspecified: Secondary | ICD-10-CM | POA: Diagnosis not present

## 2015-08-06 DIAGNOSIS — D638 Anemia in other chronic diseases classified elsewhere: Secondary | ICD-10-CM | POA: Diagnosis present

## 2015-08-06 DIAGNOSIS — Z940 Kidney transplant status: Secondary | ICD-10-CM | POA: Diagnosis not present

## 2015-08-06 DIAGNOSIS — Z8249 Family history of ischemic heart disease and other diseases of the circulatory system: Secondary | ICD-10-CM | POA: Diagnosis not present

## 2015-08-10 ENCOUNTER — Emergency Department (HOSPITAL_COMMUNITY): Payer: Medicare Other

## 2015-08-10 ENCOUNTER — Emergency Department (HOSPITAL_COMMUNITY)
Admission: EM | Admit: 2015-08-10 | Discharge: 2015-08-10 | Disposition: A | Discharge status: Home / Self Care | Payer: Medicare Other | Attending: Emergency Medicine | Admitting: Emergency Medicine

## 2015-08-10 ENCOUNTER — Encounter (HOSPITAL_COMMUNITY): Payer: Self-pay | Admitting: Emergency Medicine

## 2015-08-10 DIAGNOSIS — Z862 Personal history of diseases of the blood and blood-forming organs and certain disorders involving the immune mechanism: Secondary | ICD-10-CM | POA: Insufficient documentation

## 2015-08-10 DIAGNOSIS — F419 Anxiety disorder, unspecified: Secondary | ICD-10-CM | POA: Diagnosis not present

## 2015-08-10 DIAGNOSIS — Z992 Dependence on renal dialysis: Secondary | ICD-10-CM | POA: Diagnosis not present

## 2015-08-10 DIAGNOSIS — N186 End stage renal disease: Secondary | ICD-10-CM | POA: Insufficient documentation

## 2015-08-10 DIAGNOSIS — I120 Hypertensive chronic kidney disease with stage 5 chronic kidney disease or end stage renal disease: Secondary | ICD-10-CM | POA: Diagnosis not present

## 2015-08-10 DIAGNOSIS — Z87891 Personal history of nicotine dependence: Secondary | ICD-10-CM | POA: Diagnosis not present

## 2015-08-10 DIAGNOSIS — I132 Hypertensive heart and chronic kidney disease with heart failure and with stage 5 chronic kidney disease, or end stage renal disease: Secondary | ICD-10-CM | POA: Diagnosis not present

## 2015-08-10 DIAGNOSIS — T8612 Kidney transplant failure: Secondary | ICD-10-CM | POA: Diagnosis not present

## 2015-08-10 DIAGNOSIS — Z9115 Patient's noncompliance with renal dialysis: Secondary | ICD-10-CM | POA: Diagnosis not present

## 2015-08-10 DIAGNOSIS — G8929 Other chronic pain: Secondary | ICD-10-CM | POA: Insufficient documentation

## 2015-08-10 DIAGNOSIS — I509 Heart failure, unspecified: Secondary | ICD-10-CM | POA: Insufficient documentation

## 2015-08-10 DIAGNOSIS — E119 Type 2 diabetes mellitus without complications: Secondary | ICD-10-CM | POA: Diagnosis not present

## 2015-08-10 DIAGNOSIS — Z7901 Long term (current) use of anticoagulants: Secondary | ICD-10-CM | POA: Diagnosis not present

## 2015-08-10 DIAGNOSIS — N2889 Other specified disorders of kidney and ureter: Secondary | ICD-10-CM

## 2015-08-10 DIAGNOSIS — Z7952 Long term (current) use of systemic steroids: Secondary | ICD-10-CM | POA: Diagnosis not present

## 2015-08-10 DIAGNOSIS — R1084 Generalized abdominal pain: Secondary | ICD-10-CM | POA: Diagnosis not present

## 2015-08-10 DIAGNOSIS — Z79899 Other long term (current) drug therapy: Secondary | ICD-10-CM | POA: Diagnosis not present

## 2015-08-10 DIAGNOSIS — R140 Abdominal distension (gaseous): Secondary | ICD-10-CM | POA: Diagnosis not present

## 2015-08-10 DIAGNOSIS — D696 Thrombocytopenia, unspecified: Secondary | ICD-10-CM | POA: Diagnosis not present

## 2015-08-10 DIAGNOSIS — F329 Major depressive disorder, single episode, unspecified: Secondary | ICD-10-CM | POA: Diagnosis not present

## 2015-08-10 DIAGNOSIS — R109 Unspecified abdominal pain: Secondary | ICD-10-CM

## 2015-08-10 DIAGNOSIS — J811 Chronic pulmonary edema: Secondary | ICD-10-CM | POA: Diagnosis not present

## 2015-08-10 DIAGNOSIS — E871 Hypo-osmolality and hyponatremia: Secondary | ICD-10-CM | POA: Diagnosis not present

## 2015-08-10 DIAGNOSIS — I151 Hypertension secondary to other renal disorders: Secondary | ICD-10-CM

## 2015-08-10 DIAGNOSIS — E875 Hyperkalemia: Secondary | ICD-10-CM | POA: Diagnosis not present

## 2015-08-10 DIAGNOSIS — R0602 Shortness of breath: Secondary | ICD-10-CM | POA: Diagnosis not present

## 2015-08-10 LAB — COMPREHENSIVE METABOLIC PANEL
ALT: 43 U/L (ref 17–63)
AST: 97 U/L — AB (ref 15–41)
Albumin: 3.1 g/dL — ABNORMAL LOW (ref 3.5–5.0)
Alkaline Phosphatase: 125 U/L (ref 38–126)
Anion gap: 12 (ref 5–15)
BUN: 91 mg/dL — AB (ref 6–20)
CHLORIDE: 103 mmol/L (ref 101–111)
CO2: 23 mmol/L (ref 22–32)
Calcium: 8.9 mg/dL (ref 8.9–10.3)
Creatinine, Ser: 11.18 mg/dL — ABNORMAL HIGH (ref 0.61–1.24)
GFR calc Af Amer: 5 mL/min — ABNORMAL LOW (ref >60)
GFR calc non Af Amer: 5 mL/min — ABNORMAL LOW (ref >60)
GLUCOSE: 146 mg/dL — AB (ref 65–99)
POTASSIUM: 5.8 mmol/L — AB (ref 3.5–5.1)
Sodium: 138 mmol/L (ref 135–145)
Total Bilirubin: 0.4 mg/dL (ref 0.3–1.2)
Total Protein: 7.9 g/dL (ref 6.5–8.1)

## 2015-08-10 LAB — CBC WITH DIFFERENTIAL/PLATELET
BASOS ABS: 0 10*3/uL (ref 0.0–0.1)
BASOS PCT: 0 %
EOS PCT: 3 %
Eosinophils Absolute: 0.3 10*3/uL (ref 0.0–0.7)
HCT: 27.1 % — ABNORMAL LOW (ref 39.0–52.0)
Hemoglobin: 8.5 g/dL — ABNORMAL LOW (ref 13.0–17.0)
LYMPHS PCT: 12 %
Lymphs Abs: 0.9 10*3/uL (ref 0.7–4.0)
MCH: 27.9 pg (ref 26.0–34.0)
MCHC: 31.4 g/dL (ref 30.0–36.0)
MCV: 88.9 fL (ref 78.0–100.0)
MONO ABS: 0.6 10*3/uL (ref 0.1–1.0)
Monocytes Relative: 8 %
Neutro Abs: 5.9 10*3/uL (ref 1.7–7.7)
Neutrophils Relative %: 77 %
PLATELETS: 164 10*3/uL (ref 150–400)
RBC: 3.05 MIL/uL — ABNORMAL LOW (ref 4.22–5.81)
RDW: 16.5 % — AB (ref 11.5–15.5)
WBC: 7.6 10*3/uL (ref 4.0–10.5)

## 2015-08-10 LAB — I-STAT TROPONIN, ED: Troponin i, poc: 0.07 ng/mL (ref 0.00–0.08)

## 2015-08-10 LAB — LIPASE, BLOOD: Lipase: 30 U/L (ref 11–51)

## 2015-08-10 MED ORDER — HYDROMORPHONE HCL 1 MG/ML IJ SOLN
1.0000 mg | Freq: Once | INTRAMUSCULAR | Status: AC
  Administered 2015-08-10: 1 mg via INTRAVENOUS
  Filled 2015-08-10: qty 1

## 2015-08-10 MED ORDER — HYDRALAZINE HCL 20 MG/ML IJ SOLN
10.0000 mg | Freq: Once | INTRAMUSCULAR | Status: AC
  Administered 2015-08-10: 10 mg via INTRAVENOUS
  Filled 2015-08-10: qty 1

## 2015-08-10 MED ORDER — ONDANSETRON HCL 4 MG/2ML IJ SOLN
4.0000 mg | Freq: Once | INTRAMUSCULAR | Status: AC
  Administered 2015-08-10: 4 mg via INTRAVENOUS
  Filled 2015-08-10: qty 2

## 2015-08-10 NOTE — ED Provider Notes (Addendum)
CSN: [id number]     Arrival date & time 08/10/15  0240 History   First MD Initiated Contact with Patient 08/10/15 [phone]     Chief Complaint  Patient presents with  . Shortness of Breath  . Vomiting   HPI Pt presents to the ED from dialysis.  He last went on saturday.  He skipped Tuesday because he was not feeling well.  Pt states at the dialysis center his BP was too elevated so he was sent to the ED.  Pt has been having abdominal pain, he has been feeling short of breath, and he has been having chest pain.  No fevers.  He vomited on Monday and Tuesday.  He then took some phenergan and was able to eat after that.  He has not vomited since.  He does feel fluid overloaded.  He has had this often in the past. Past Medical History  Diagnosis Date  . Hypertension   . Depression   . Complication of anesthesia     itching, sore throat  . Diabetes mellitus without complication ([place])     No history per patient, but remains under history as A1c would not be accurate given on dialysis  . Shortness of breath   . Anxiety   . ESRD (end stage renal disease) ([place])     due to HTN per patient, followed at [facility], s/p failed kidney transplant - dialysis Tue, Th, Sat  . Renal insufficiency   . CHF (congestive heart failure) ([place])   . Anemia   . Dialysis patient [facility])    Past Surgical History  Procedure Laterality Date  . Kidney receipient  2006    failed and started HD in March 2014  . Capd insertion    . Capd removal    . Left heart catheterization with coronary angiogram N/A 09/02/2014    Procedure: LEFT HEART CATHETERIZATION WITH CORONARY ANGIOGRAM;  Surgeon: [name], MD;  Location[facility] CATH LAB;  Service: Cardiovascular;  Laterality: N/A;  . Inguinal hernia repair Right 02/14/2015    Procedure: REPAIR INCARCERATED RIGHT INGUINAL HERNIA;  Surgeon: [name], MD;  Location: [place];  Service: General;  Laterality: Right;   History reviewed. No pertinent family history. Social History   Substance Use Topics  . Smoking status: Former Smoker -- 0.00 packs/day for 1 years    Types: Cigarettes  . Smokeless tobacco: Never Used     Comment: quit Jan 2014  . Alcohol Use: No    Review of Systems  All other systems reviewed and are negative.     Allergies  Ferrlecit and Darvocet  Home Medications   Prior to Admission medications   Medication Sig Start Date End Date Taking? Authorizing Provider  ALPRAZolam [name]) 0.5 MG tablet Take 0.5 mg by mouth 2 (two) times daily as needed for anxiety.   Yes Historical Provider, MD  amLODipine (NORVASC) 10 MG tablet Take 10 mg by mouth daily.   Yes Historical Provider, MD  atorvastatin (LIPITOR) 40 MG tablet Take 1 tablet (40 mg total) by mouth daily at 6 PM. 09/02/14  Yes Barton Dubois, MD  carvedilol (COREG) 25 MG tablet Take 1 tablet (25 mg total) by mouth 2 (two) times daily with a meal. 10/10/14  Yes [name], DO  cinacalcet (SENSIPAR) 30 MG tablet Take 30 mg by mouth daily.   Yes Historical Provider, MD  cloNIDine (CATAPRES - DOSED IN MG/24 HR) 0.1 mg/24hr patch Place 0.1 mg onto the skin once a week.  Yes Historical Provider, MD  docusate sodium (COLACE) 100 MG capsule Take 1 capsule (100 mg total) by mouth 2 (two) times daily. 11/19/14  Yes [name], MD  doxazosin (CARDURA) 2 MG tablet Take 2 mg by mouth daily. 06/14/15 09/12/15 Yes Historical Provider, MD  doxercalciferol (HECTOROL) 4 MCG/2ML injection Inject 1.25 mLs (2.5 mcg total) into the vein Every Tuesday,Thursday,and Saturday with dialysis. 10/10/14  Yes [name], DO  gabapentin (NEURONTIN) 100 MG capsule Take 100 mg by mouth 3 (three) times daily. 06/14/15 09/12/15 Yes Historical Provider, MD  hydrALAZINE (APRESOLINE) 50 MG tablet Take 1 tablet (50 mg total) by mouth every 8 (eight) hours. 02/04/15  Yes [name], MD  hydrocerin (EUCERIN) CREA Apply 1 application topically 2 (two) times daily. 02/04/15  Yes [name], MD  HYDROcodone-acetaminophen  (NORCO/VICODIN) 5-325 MG tablet Take 1 tablet by mouth 2 (two) times daily as needed for severe pain. 07/18/15  Yes Everlene Balls, MD  isosorbide mononitrate (IMDUR) 60 MG 24 hr tablet Take 60 mg by mouth daily. 06/14/15 09/12/15 Yes Historical Provider, MD  levalbuterol Penne Lash HFA) 45 MCG/ACT inhaler Inhale 2 puffs into the lungs every 6 (six) hours as needed for wheezing or shortness of breath. 11/19/14  Yes [name], MD  lisinopril (PRINIVIL,ZESTRIL) 20 MG tablet Take 1 tablet (20 mg total) by mouth daily. 02/04/15  Yes [name], MD  methocarbamol (ROBAXIN) 500 MG tablet Take 2 tablets (1,000 mg total) by mouth every 8 (eight) hours as needed for muscle spasms. 01/26/15  Yes [name], MD  multivitamin (RENA-VIT) TABS tablet Take 1 tablet by mouth at bedtime. 02/04/15  Yes [name], MD  omeprazole (PRILOSEC) 20 MG capsule Take 20 mg by mouth daily. 07/01/13  Yes Historical Provider, MD  ondansetron (ZOFRAN-ODT) 4 MG disintegrating tablet Take 1 tablet (4 mg total) by mouth every 8 (eight) hours as needed for nausea or vomiting. 12/05/14  Yes Verlee Monte, MD  predniSONE (DELTASONE) 5 MG tablet Take 5 mg by mouth daily with breakfast.   Yes Historical Provider, MD  sevelamer carbonate (RENVELA) 800 MG tablet Take 800-1,600 mg by mouth 3 (three) times daily with meals. 2 tabs three times daily with meals, and 1 tablet with snacks   Yes Historical Provider, MD  warfarin (COUMADIN) 6 MG tablet Take 1 tablet (6 mg total) by mouth daily at 6 PM. Take daily until adjusted per MD 02/17/15  Yes Eugenie Filler, MD   BP 164/115 mmHg  Pulse 87  Temp(Src) 97.7 F (36.5 C) (Oral)  Resp 15  SpO2 92% Physical Exam  Constitutional: No distress.  HENT:  Head: Normocephalic and atraumatic.  Right Ear: External ear normal.  Left Ear: External ear normal.  Eyes: Conjunctivae are normal. Right eye exhibits no discharge. Left eye exhibits no discharge. No scleral icterus.  Neck: Neck supple. No  tracheal deviation present.  Cardiovascular: Normal rate, regular rhythm and intact distal pulses.   Pulmonary/Chest: Effort normal and breath sounds normal. No stridor. No respiratory distress. He has no wheezes. He has no rales.  Right subclavian vasc catheter, no ee  Abdominal: Soft. Bowel sounds are normal. He exhibits no distension. There is generalized tenderness. There is no rebound and no guarding.  Musculoskeletal: He exhibits edema (bilateral lower extrem). He exhibits no tenderness.  Neurological: He is alert. He has normal strength. No cranial nerve deficit (no facial droop, extraocular movements intact, no slurred speech) or sensory deficit. He exhibits normal  muscle tone. He displays no seizure activity. Coordination normal.  Skin: Skin is warm and dry. Rash noted. No pallor.  Dry skin diffusely, lichenification  Psychiatric: He has a normal mood and affect.  Nursing note and vitals reviewed.   ED Course  Procedures (including critical care time) Labs Review Labs Reviewed  CBC WITH DIFFERENTIAL/PLATELET - Abnormal; Notable for the following:    RBC 3.05 (*)    Hemoglobin 8.5 (*)    HCT 27.1 (*)    RDW 16.5 (*)    All other components within normal limits  COMPREHENSIVE METABOLIC PANEL - Abnormal; Notable for the following:    Potassium 5.8 (*)    Glucose, Bld 146 (*)    BUN 91 (*)    Creatinine, Ser 11.18 (*)    Albumin 3.1 (*)    AST 97 (*)    GFR calc non Af Amer 5 (*)    GFR calc Af Amer 5 (*)    All other components within normal limits  LIPASE, BLOOD  I-STAT TROPOININ, ED    Imaging Review Dg Chest 2 View  08/10/2015  CLINICAL DATA:  Shortness of breath.  Chronic renal failure EXAM: CHEST  2 VIEW COMPARISON:  July 17, 2015 FINDINGS: There is cardiomegaly with mild pulmonary venous hypertension. There is a minimal right effusion. No edema or consolidation. Central catheter tip is in the right atrium slightly beyond the cavoatrial junction. No pneumothorax.  No adenopathy. IMPRESSION: Persistent pulmonary vascular congestion. No frank edema or consolidation. Minimal right effusion. No change and central catheter position. No pneumothorax. Electronically Signed   By: Lowella Grip III M.D.   On: 08/10/2015 09:16   I have personally reviewed and evaluated these images and lab results as part of my medical decision-making.   EKG Interpretation   Date/Time:  Thursday August 10 2015 08:42:14 EST Ventricular Rate:  81 PR Interval:  174 QRS Duration: 82 QT Interval:  400 QTC Calculation: 464 R Axis:   28 Text Interpretation:  Normal sinus rhythm Cannot rule out Anterior infarct  , age undetermined T wave abnormality, consider lateral ischemia Abnormal  ECG No significant change since last tracing Confirmed by Tearra Ouk  MD-J, Shirlyn Savin  ([id number]) on 08/10/2015 9:47:34 AM     Medications  hydrALAZINE (APRESOLINE) injection 10 mg (10 mg Intravenous Given 08/10/15 1006)  HYDROmorphone (DILAUDID) injection 1 mg (1 mg Intravenous Given 08/10/15 1006)  ondansetron (ZOFRAN) injection 4 mg (4 mg Intravenous Given 08/10/15 1124)    MDM   Final diagnoses:  Hypertension secondary to other renal disorders  Chronic abdominal pain  Non-compliance with renal dialysis ([place])    Pt presents to the ED with HTN, nausea and vomiting after missing dialysis.  Labs today are consistent with his chronic renal failure but otherwise no emergent issues.  Mild increase in potasium but no EKG changes.  BP is better after a dose of IV bp meds  Discussed case with Dr Jimmy Footman.  Pt does not need emergent dialysis.  Pt should follow up at his dialysis center today for treatment.   Dorie Rank, MD 08/10/15 1222  Will contact dialysis center to make arrangements  Dorie Rank, MD 08/10/15 1237

## 2015-08-10 NOTE — ED Notes (Signed)
Pt sts N/V x 4 days and SOB; pt dialysis pt and went to dialysis today but was too htn and sent here; pt sts last dialysis was Saturday

## 2015-08-10 NOTE — Discharge Instructions (Signed)

## 2015-08-11 DIAGNOSIS — L299 Pruritus, unspecified: Secondary | ICD-10-CM | POA: Diagnosis present

## 2015-08-11 DIAGNOSIS — Z856 Personal history of leukemia: Secondary | ICD-10-CM | POA: Diagnosis not present

## 2015-08-11 DIAGNOSIS — Z7901 Long term (current) use of anticoagulants: Secondary | ICD-10-CM | POA: Diagnosis not present

## 2015-08-11 DIAGNOSIS — N186 End stage renal disease: Secondary | ICD-10-CM | POA: Diagnosis not present

## 2015-08-11 DIAGNOSIS — J811 Chronic pulmonary edema: Secondary | ICD-10-CM | POA: Diagnosis present

## 2015-08-11 DIAGNOSIS — Z841 Family history of disorders of kidney and ureter: Secondary | ICD-10-CM | POA: Diagnosis not present

## 2015-08-11 DIAGNOSIS — D696 Thrombocytopenia, unspecified: Secondary | ICD-10-CM | POA: Diagnosis present

## 2015-08-11 DIAGNOSIS — Z992 Dependence on renal dialysis: Secondary | ICD-10-CM | POA: Diagnosis not present

## 2015-08-11 DIAGNOSIS — E8770 Fluid overload, unspecified: Secondary | ICD-10-CM | POA: Diagnosis not present

## 2015-08-11 DIAGNOSIS — D649 Anemia, unspecified: Secondary | ICD-10-CM | POA: Diagnosis present

## 2015-08-11 DIAGNOSIS — R1031 Right lower quadrant pain: Secondary | ICD-10-CM | POA: Diagnosis not present

## 2015-08-11 DIAGNOSIS — I5032 Chronic diastolic (congestive) heart failure: Secondary | ICD-10-CM | POA: Diagnosis present

## 2015-08-11 DIAGNOSIS — I120 Hypertensive chronic kidney disease with stage 5 chronic kidney disease or end stage renal disease: Secondary | ICD-10-CM | POA: Diagnosis not present

## 2015-08-11 DIAGNOSIS — R140 Abdominal distension (gaseous): Secondary | ICD-10-CM | POA: Diagnosis not present

## 2015-08-11 DIAGNOSIS — E871 Hypo-osmolality and hyponatremia: Secondary | ICD-10-CM | POA: Diagnosis present

## 2015-08-11 DIAGNOSIS — E8779 Other fluid overload: Secondary | ICD-10-CM | POA: Diagnosis present

## 2015-08-11 DIAGNOSIS — Z833 Family history of diabetes mellitus: Secondary | ICD-10-CM | POA: Diagnosis not present

## 2015-08-11 DIAGNOSIS — T8612 Kidney transplant failure: Secondary | ICD-10-CM | POA: Diagnosis present

## 2015-08-11 DIAGNOSIS — G4733 Obstructive sleep apnea (adult) (pediatric): Secondary | ICD-10-CM | POA: Diagnosis not present

## 2015-08-11 DIAGNOSIS — K219 Gastro-esophageal reflux disease without esophagitis: Secondary | ICD-10-CM | POA: Diagnosis present

## 2015-08-11 DIAGNOSIS — E875 Hyperkalemia: Secondary | ICD-10-CM | POA: Diagnosis not present

## 2015-08-11 DIAGNOSIS — G8929 Other chronic pain: Secondary | ICD-10-CM | POA: Diagnosis present

## 2015-08-11 DIAGNOSIS — N2889 Other specified disorders of kidney and ureter: Secondary | ICD-10-CM | POA: Diagnosis present

## 2015-08-11 DIAGNOSIS — R0602 Shortness of breath: Secondary | ICD-10-CM | POA: Diagnosis not present

## 2015-08-11 DIAGNOSIS — I132 Hypertensive heart and chronic kidney disease with heart failure and with stage 5 chronic kidney disease, or end stage renal disease: Secondary | ICD-10-CM | POA: Diagnosis present

## 2015-08-11 DIAGNOSIS — R9431 Abnormal electrocardiogram [ECG] [EKG]: Secondary | ICD-10-CM | POA: Diagnosis not present

## 2015-08-11 DIAGNOSIS — R0902 Hypoxemia: Secondary | ICD-10-CM | POA: Diagnosis present

## 2015-08-11 DIAGNOSIS — R109 Unspecified abdominal pain: Secondary | ICD-10-CM | POA: Diagnosis not present

## 2015-08-11 DIAGNOSIS — I10 Essential (primary) hypertension: Secondary | ICD-10-CM | POA: Diagnosis not present

## 2015-08-11 DIAGNOSIS — Z87891 Personal history of nicotine dependence: Secondary | ICD-10-CM | POA: Diagnosis not present

## 2015-08-11 DIAGNOSIS — Z8249 Family history of ischemic heart disease and other diseases of the circulatory system: Secondary | ICD-10-CM | POA: Diagnosis not present

## 2015-08-11 DIAGNOSIS — N451 Epididymitis: Secondary | ICD-10-CM | POA: Diagnosis present

## 2015-08-18 ENCOUNTER — Encounter (HOSPITAL_COMMUNITY): Payer: Self-pay | Admitting: *Deleted

## 2015-08-18 ENCOUNTER — Emergency Department (HOSPITAL_COMMUNITY): Payer: Medicare Other

## 2015-08-18 ENCOUNTER — Inpatient Hospital Stay (HOSPITAL_COMMUNITY)
Admission: EM | Admit: 2015-08-18 | Discharge: 2015-08-20 | DRG: 640 | Disposition: A | Discharge status: Home / Self Care | Payer: Medicare Other | Attending: Pulmonary Disease | Admitting: Pulmonary Disease

## 2015-08-18 DIAGNOSIS — N2581 Secondary hyperparathyroidism of renal origin: Secondary | ICD-10-CM | POA: Diagnosis not present

## 2015-08-18 DIAGNOSIS — D649 Anemia, unspecified: Secondary | ICD-10-CM | POA: Diagnosis present

## 2015-08-18 DIAGNOSIS — N19 Unspecified kidney failure: Secondary | ICD-10-CM

## 2015-08-18 DIAGNOSIS — E1142 Type 2 diabetes mellitus with diabetic polyneuropathy: Secondary | ICD-10-CM | POA: Diagnosis present

## 2015-08-18 DIAGNOSIS — Z9119 Patient's noncompliance with other medical treatment and regimen: Secondary | ICD-10-CM

## 2015-08-18 DIAGNOSIS — E875 Hyperkalemia: Secondary | ICD-10-CM | POA: Diagnosis not present

## 2015-08-18 DIAGNOSIS — Z86718 Personal history of other venous thrombosis and embolism: Secondary | ICD-10-CM

## 2015-08-18 DIAGNOSIS — R001 Bradycardia, unspecified: Secondary | ICD-10-CM | POA: Diagnosis present

## 2015-08-18 DIAGNOSIS — F329 Major depressive disorder, single episode, unspecified: Secondary | ICD-10-CM | POA: Diagnosis present

## 2015-08-18 DIAGNOSIS — I441 Atrioventricular block, second degree: Secondary | ICD-10-CM | POA: Diagnosis present

## 2015-08-18 DIAGNOSIS — R410 Disorientation, unspecified: Secondary | ICD-10-CM

## 2015-08-18 DIAGNOSIS — F1110 Opioid abuse, uncomplicated: Secondary | ICD-10-CM | POA: Diagnosis present

## 2015-08-18 DIAGNOSIS — F4323 Adjustment disorder with mixed anxiety and depressed mood: Secondary | ICD-10-CM | POA: Diagnosis not present

## 2015-08-18 DIAGNOSIS — Z87891 Personal history of nicotine dependence: Secondary | ICD-10-CM | POA: Diagnosis not present

## 2015-08-18 DIAGNOSIS — Z86711 Personal history of pulmonary embolism: Secondary | ICD-10-CM | POA: Diagnosis not present

## 2015-08-18 DIAGNOSIS — T8612 Kidney transplant failure: Secondary | ICD-10-CM | POA: Diagnosis present

## 2015-08-18 DIAGNOSIS — I4891 Unspecified atrial fibrillation: Secondary | ICD-10-CM | POA: Diagnosis present

## 2015-08-18 DIAGNOSIS — I5042 Chronic combined systolic (congestive) and diastolic (congestive) heart failure: Secondary | ICD-10-CM | POA: Diagnosis present

## 2015-08-18 DIAGNOSIS — J811 Chronic pulmonary edema: Secondary | ICD-10-CM | POA: Diagnosis present

## 2015-08-18 DIAGNOSIS — R0600 Dyspnea, unspecified: Secondary | ICD-10-CM

## 2015-08-18 DIAGNOSIS — E785 Hyperlipidemia, unspecified: Secondary | ICD-10-CM | POA: Diagnosis present

## 2015-08-18 DIAGNOSIS — E1122 Type 2 diabetes mellitus with diabetic chronic kidney disease: Secondary | ICD-10-CM | POA: Diagnosis present

## 2015-08-18 DIAGNOSIS — Z992 Dependence on renal dialysis: Secondary | ICD-10-CM

## 2015-08-18 DIAGNOSIS — N186 End stage renal disease: Secondary | ICD-10-CM

## 2015-08-18 DIAGNOSIS — K219 Gastro-esophageal reflux disease without esophagitis: Secondary | ICD-10-CM | POA: Diagnosis present

## 2015-08-18 DIAGNOSIS — Z9115 Patient's noncompliance with renal dialysis: Secondary | ICD-10-CM | POA: Diagnosis not present

## 2015-08-18 DIAGNOSIS — I132 Hypertensive heart and chronic kidney disease with heart failure and with stage 5 chronic kidney disease, or end stage renal disease: Secondary | ICD-10-CM | POA: Diagnosis present

## 2015-08-18 DIAGNOSIS — D631 Anemia in chronic kidney disease: Secondary | ICD-10-CM | POA: Diagnosis not present

## 2015-08-18 DIAGNOSIS — G9349 Other encephalopathy: Secondary | ICD-10-CM | POA: Diagnosis present

## 2015-08-18 DIAGNOSIS — J810 Acute pulmonary edema: Secondary | ICD-10-CM | POA: Diagnosis not present

## 2015-08-18 DIAGNOSIS — Z7901 Long term (current) use of anticoagulants: Secondary | ICD-10-CM | POA: Diagnosis not present

## 2015-08-18 DIAGNOSIS — R0602 Shortness of breath: Secondary | ICD-10-CM | POA: Diagnosis not present

## 2015-08-18 DIAGNOSIS — Z91199 Patient's noncompliance with other medical treatment and regimen due to unspecified reason: Secondary | ICD-10-CM

## 2015-08-18 DIAGNOSIS — I120 Hypertensive chronic kidney disease with stage 5 chronic kidney disease or end stage renal disease: Secondary | ICD-10-CM | POA: Diagnosis not present

## 2015-08-18 DIAGNOSIS — J9601 Acute respiratory failure with hypoxia: Secondary | ICD-10-CM

## 2015-08-18 DIAGNOSIS — Y830 Surgical operation with transplant of whole organ as the cause of abnormal reaction of the patient, or of later complication, without mention of misadventure at the time of the procedure: Secondary | ICD-10-CM | POA: Diagnosis present

## 2015-08-18 LAB — CBC
HCT: 28.5 % — ABNORMAL LOW (ref 39.0–52.0)
Hemoglobin: 9 g/dL — ABNORMAL LOW (ref 13.0–17.0)
MCH: 28.3 pg (ref 26.0–34.0)
MCHC: 31.6 g/dL (ref 30.0–36.0)
MCV: 89.6 fL (ref 78.0–100.0)
Platelets: 145 10*3/uL — ABNORMAL LOW (ref 150–400)
RBC: 3.18 MIL/uL — AB (ref 4.22–5.81)
RDW: 15.8 % — AB (ref 11.5–15.5)
WBC: 6.5 10*3/uL (ref 4.0–10.5)

## 2015-08-18 LAB — GLUCOSE, CAPILLARY
GLUCOSE-CAPILLARY: 195 mg/dL — AB (ref 65–99)
Glucose-Capillary: 73 mg/dL (ref 65–99)

## 2015-08-18 LAB — BASIC METABOLIC PANEL
Anion gap: 13 (ref 5–15)
BUN: 99 mg/dL — AB (ref 6–20)
CALCIUM: 8.9 mg/dL (ref 8.9–10.3)
CO2: 23 mmol/L (ref 22–32)
CREATININE: 13.71 mg/dL — AB (ref 0.61–1.24)
Chloride: 106 mmol/L (ref 101–111)
GFR, EST AFRICAN AMERICAN: 4 mL/min — AB (ref >60)
GFR, EST NON AFRICAN AMERICAN: 4 mL/min — AB (ref >60)
Glucose, Bld: 152 mg/dL — ABNORMAL HIGH (ref 65–99)
Potassium: >7.5 mmol/L (ref 3.5–5.1)
SODIUM: 142 mmol/L (ref 135–145)

## 2015-08-18 LAB — CBG MONITORING, ED: Glucose-Capillary: 256 mg/dL — ABNORMAL HIGH (ref 65–99)

## 2015-08-18 LAB — I-STAT TROPONIN, ED: TROPONIN I, POC: 0.04 ng/mL (ref 0.00–0.08)

## 2015-08-18 LAB — MRSA PCR SCREENING: MRSA BY PCR: INVALID — AB

## 2015-08-18 MED ORDER — SODIUM CHLORIDE 0.9 % IV SOLN: 1.0000 g | Freq: Once | INTRAVENOUS | Status: DC

## 2015-08-18 MED ORDER — SODIUM CHLORIDE 0.9 % IV SOLN: 100.0000 mL | INTRAVENOUS | Status: DC | PRN

## 2015-08-18 MED ORDER — INSULIN ASPART 100 UNIT/ML IV SOLN
INTRAVENOUS | Status: AC
  Filled 2015-08-18: qty 1

## 2015-08-18 MED ORDER — HEPARIN SODIUM (PORCINE) 1000 UNIT/ML DIALYSIS: 1000.0000 [IU] | INTRAMUSCULAR | Status: DC | PRN

## 2015-08-18 MED ORDER — INSULIN ASPART 100 UNIT/ML ~~LOC~~ SOLN: 0.0000 [IU] | Freq: Three times a day (TID) | SUBCUTANEOUS | Status: DC

## 2015-08-18 MED ORDER — PANTOPRAZOLE SODIUM 40 MG PO TBEC
40.0000 mg | DELAYED_RELEASE_TABLET | Freq: Every day | ORAL | Status: DC
  Administered 2015-08-19 – 2015-08-20 (×2): 40 mg via ORAL
  Filled 2015-08-18 (×2): qty 1

## 2015-08-18 MED ORDER — ONDANSETRON HCL 4 MG/2ML IJ SOLN: 4.0000 mg | Freq: Four times a day (QID) | INTRAMUSCULAR | Status: DC | PRN

## 2015-08-18 MED ORDER — INSULIN ASPART 100 UNIT/ML IV SOLN
10.0000 [IU] | Freq: Once | INTRAVENOUS | Status: AC
  Administered 2015-08-18: 10 [IU] via INTRAVENOUS

## 2015-08-18 MED ORDER — CALCIUM CHLORIDE 10 % IV SOLN: 1.0000 g | Freq: Once | INTRAVENOUS | Status: DC

## 2015-08-18 MED ORDER — INSULIN ASPART 100 UNIT/ML ~~LOC~~ SOLN: 0.0000 [IU] | SUBCUTANEOUS | Status: DC

## 2015-08-18 MED ORDER — ISOSORBIDE MONONITRATE ER 60 MG PO TB24
60.0000 mg | ORAL_TABLET | Freq: Every day | ORAL | Status: DC
  Administered 2015-08-19 – 2015-08-20 (×2): 60 mg via ORAL
  Filled 2015-08-18 (×2): qty 1

## 2015-08-18 MED ORDER — CALCIUM CHLORIDE 10 % IV SOLN
1.0000 g | Freq: Once | INTRAVENOUS | Status: AC
  Administered 2015-08-18: 1 g via INTRAVENOUS

## 2015-08-18 MED ORDER — HYDRALAZINE HCL 50 MG PO TABS
50.0000 mg | ORAL_TABLET | Freq: Three times a day (TID) | ORAL | Status: DC
  Administered 2015-08-18 – 2015-08-20 (×5): 50 mg via ORAL
  Filled 2015-08-18 (×8): qty 1

## 2015-08-18 MED ORDER — HEPARIN SODIUM (PORCINE) 1000 UNIT/ML DIALYSIS: 20.0000 [IU]/kg | INTRAMUSCULAR | Status: DC | PRN

## 2015-08-18 MED ORDER — ALBUTEROL (5 MG/ML) CONTINUOUS INHALATION SOLN
10.0000 mg/h | INHALATION_SOLUTION | Freq: Once | RESPIRATORY_TRACT | Status: AC
  Administered 2015-08-18: 10 mg/h via RESPIRATORY_TRACT
  Filled 2015-08-18: qty 20

## 2015-08-18 MED ORDER — LIDOCAINE HCL (PF) 1 % IJ SOLN: 5.0000 mL | INTRAMUSCULAR | Status: DC | PRN

## 2015-08-18 MED ORDER — LIDOCAINE-PRILOCAINE 2.5-2.5 % EX CREA
1.0000 "application " | TOPICAL_CREAM | CUTANEOUS | Status: DC | PRN
  Filled 2015-08-18: qty 5

## 2015-08-18 MED ORDER — DEXTROSE 50 % IV SOLN
1.0000 | Freq: Once | INTRAVENOUS | Status: AC
  Administered 2015-08-18: 50 mL via INTRAVENOUS
  Filled 2015-08-18: qty 50

## 2015-08-18 MED ORDER — HEPARIN SODIUM (PORCINE) 5000 UNIT/ML IJ SOLN: 5000.0000 [IU] | Freq: Three times a day (TID) | INTRAMUSCULAR | Status: DC

## 2015-08-18 MED ORDER — CARVEDILOL 25 MG PO TABS
25.0000 mg | ORAL_TABLET | Freq: Two times a day (BID) | ORAL | Status: DC
  Administered 2015-08-18 – 2015-08-20 (×5): 25 mg via ORAL
  Filled 2015-08-18 (×7): qty 1

## 2015-08-18 MED ORDER — ALTEPLASE 2 MG IJ SOLR
2.0000 mg | Freq: Once | INTRAMUSCULAR | Status: DC | PRN
  Filled 2015-08-18: qty 2

## 2015-08-18 MED ORDER — DEXTROSE 50 % IV SOLN
INTRAVENOUS | Status: AC
  Filled 2015-08-18: qty 50

## 2015-08-18 MED ORDER — INSULIN ASPART 100 UNIT/ML IV SOLN
10.0000 [IU] | Freq: Once | INTRAVENOUS | Status: AC
  Administered 2015-08-18: 10 [IU] via INTRAVENOUS
  Filled 2015-08-18: qty 1

## 2015-08-18 MED ORDER — AMLODIPINE BESYLATE 10 MG PO TABS
10.0000 mg | ORAL_TABLET | Freq: Every day | ORAL | Status: DC
  Administered 2015-08-19 – 2015-08-20 (×2): 10 mg via ORAL
  Filled 2015-08-18 (×3): qty 1

## 2015-08-18 MED ORDER — PANTOPRAZOLE SODIUM 40 MG IV SOLR: 40.0000 mg | Freq: Every day | INTRAVENOUS | Status: DC

## 2015-08-18 MED ORDER — CALCIUM GLUCONATE 10 % IV SOLN: 1.0000 g | Freq: Once | INTRAVENOUS | Status: DC

## 2015-08-18 MED ORDER — CALCIUM GLUCONATE 10 % IV SOLN
1.0000 g | Freq: Once | INTRAVENOUS | Status: AC
  Administered 2015-08-18: 1 g via INTRAVENOUS
  Filled 2015-08-18: qty 10

## 2015-08-18 MED ORDER — LISINOPRIL 20 MG PO TABS
20.0000 mg | ORAL_TABLET | Freq: Every day | ORAL | Status: DC
  Administered 2015-08-19 – 2015-08-20 (×2): 20 mg via ORAL
  Filled 2015-08-18 (×2): qty 1

## 2015-08-18 MED ORDER — PENTAFLUOROPROP-TETRAFLUOROETH EX AERO: 1.0000 "application " | INHALATION_SPRAY | CUTANEOUS | Status: DC | PRN

## 2015-08-18 MED ORDER — DEXTROSE 50 % IV SOLN
1.0000 | Freq: Once | INTRAVENOUS | Status: AC
  Administered 2015-08-18: 50 mL via INTRAVENOUS

## 2015-08-18 MED ORDER — SODIUM CHLORIDE 0.9 % IV SOLN: 250.0000 mL | INTRAVENOUS | Status: DC | PRN

## 2015-08-18 NOTE — ED Notes (Signed)
Pt brady in 53s PA at bedside, 2RNs unable to obtain access, hemodialysis RN to bedside to access.

## 2015-08-18 NOTE — Procedures (Signed)
Patient was seen on dialysis and the procedure was supervised. BFR 400 Via RIJ TDC BP is 152/95.  Patient appears to be tolerating treatment well, HR 70

## 2015-08-18 NOTE — H&P (Signed)
PULMONARY / CRITICAL CARE MEDICINE   Name: Frank [name] MRN: [id number] DOB: [birth year]    ADMISSION DATE:  08/18/2015 CONSULTATION DATE:  08/18/15  REFERRING MD :  EDP PRIMARY SERVICE: PCCM  CHIEF COMPLAINT:  Hyperkalemia   BRIEF PATIENT DESCRIPTION:  50 yo M with ESRD noncompliant with HD who presented from his outpatient HD center in [facility] with dyspnea and substernal chest pain found to have hyperkalemia with K of >7.5 with peaked t-waves, pulmonary edema, and new 1st heart block requiring emergent dialysis.     SIGNIFICANT EVENTS / STUDIES:  1/27 Admitted with hyperkalemia requiring emergent HD 1/27 CXR with cardiomegaly and vascular congestion 1/27 12-lead EKG with marked sinus bradycardia with HR 36 and marked 1st degree heart block. Noted to be in 20's per ED nursing note.   LINES / TUBES: Permanent right HD double lumen catheter  CULTURES: 1/27 MRSA >>  ANTIBIOTICS: None  HISTORY OF PRESENT ILLNESS:    Frank [name] is a pleasant 50 yo man with ESRD s/p failed kidney transplant, hypertension, hyperlipidemia, chronic combined CHF (EF 35-40%), history of PE/DVT on coumadin, diet controlled Type 2 DM, ACD, and anxiety/depression with history of  noncompliance with HD (last treatment cut short 2.5/4 hr) who presented from his outpatient HD center in [facility] after being unable to complete treatment due to dyspnea, substernal chest pain, nausea, and weakness found to have in the ED hyperkalemia of >7.5, pulmonary edema, and new 1st heart block with HR 36 on EKG and peaked t-waves. In the ED he received continuous albuterol nebulizer, IV insulin/glucose, IV calcium, and is to have to emergent HD. He also had external pacer pads placed in ED with HR as low as 20's per ED nursing note.         PAST MEDICAL HISTORY :  Past Medical History  Diagnosis Date  . Hypertension   . Depression   . Complication of anesthesia     itching, sore throat  . Diabetes mellitus without  complication ([place])     No history per patient, but remains under history as A1c would not be accurate given on dialysis  . Shortness of breath   . Anxiety   . ESRD (end stage renal disease) ([place])     due to HTN per patient, followed at [facility], s/p failed kidney transplant - dialysis Tue, Th, Sat  . Renal insufficiency   . CHF (congestive heart failure) ([place])   . Anemia   . Dialysis patient [facility])    Past Surgical History  Procedure Laterality Date  . Kidney receipient  2006    failed and started HD in March 2014  . Capd insertion    . Capd removal    . Left heart catheterization with coronary angiogram N/A 09/02/2014    Procedure: LEFT HEART CATHETERIZATION WITH CORONARY ANGIOGRAM;  Surgeon: [name], MD;  Location: [facility] CATH LAB;  Service: Cardiovascular;  Laterality: N/A;  . Inguinal hernia repair Right 02/14/2015    Procedure: REPAIR INCARCERATED RIGHT INGUINAL HERNIA;  Surgeon: [name], MD;  Location: [place];  Service: General;  Laterality: Right;   Prior to Admission medications   Medication Sig Start Date End Date Taking? Authorizing Provider  ALPRAZolam [name]) 0.5 MG tablet Take 0.5 mg by mouth 2 (two) times daily as needed for anxiety.    Historical Provider, MD  amLODipine (NORVASC) 10 MG tablet Take 10 mg by mouth daily.    Historical Provider, MD  atorvastatin (LIPITOR) 40 MG  tablet Take 1 tablet (40 mg total) by mouth daily at 6 PM. 09/02/14   Barton Dubois, MD  carvedilol (COREG) 25 MG tablet Take 1 tablet (25 mg total) by mouth 2 (two) times daily with a meal. 10/10/14   [name], DO  cinacalcet (SENSIPAR) 30 MG tablet Take 30 mg by mouth daily.    Historical Provider, MD  cloNIDine (CATAPRES - DOSED IN MG/24 HR) 0.1 mg/24hr patch Place 0.1 mg onto the skin once a week.    Historical Provider, MD  docusate sodium (COLACE) 100 MG capsule Take 1 capsule (100 mg total) by mouth 2 (two) times daily. 11/19/14   [name], MD  doxazosin (CARDURA) 2 MG tablet Take  2 mg by mouth daily. 06/14/15 09/12/15  Historical Provider, MD  doxercalciferol (HECTOROL) 4 MCG/2ML injection Inject 1.25 mLs (2.5 mcg total) into the vein Every Tuesday,Thursday,and Saturday with dialysis. 10/10/14   [name], DO  gabapentin (NEURONTIN) 100 MG capsule Take 100 mg by mouth 3 (three) times daily. 06/14/15 09/12/15  Historical Provider, MD  hydrALAZINE (APRESOLINE) 50 MG tablet Take 1 tablet (50 mg total) by mouth every 8 (eight) hours. 02/04/15   [name], MD  hydrocerin (EUCERIN) CREA Apply 1 application topically 2 (two) times daily. 02/04/15   [name], MD  HYDROcodone-acetaminophen (NORCO/VICODIN) 5-325 MG tablet Take 1 tablet by mouth 2 (two) times daily as needed for severe pain. 07/18/15   Everlene Balls, MD  isosorbide mononitrate (IMDUR) 60 MG 24 hr tablet Take 60 mg by mouth daily. 06/14/15 09/12/15  Historical Provider, MD  levalbuterol Penne Lash HFA) 45 MCG/ACT inhaler Inhale 2 puffs into the lungs every 6 (six) hours as needed for wheezing or shortness of breath. 11/19/14   [name], MD  lisinopril (PRINIVIL,ZESTRIL) 20 MG tablet Take 1 tablet (20 mg total) by mouth daily. 02/04/15   [name], MD  methocarbamol (ROBAXIN) 500 MG tablet Take 2 tablets (1,000 mg total) by mouth every 8 (eight) hours as needed for muscle spasms. 01/26/15   [name], MD  multivitamin (RENA-VIT) TABS tablet Take 1 tablet by mouth at bedtime. 02/04/15   [name], MD  omeprazole (PRILOSEC) 20 MG capsule Take 20 mg by mouth daily. 07/01/13   Historical Provider, MD  ondansetron (ZOFRAN-ODT) 4 MG disintegrating tablet Take 1 tablet (4 mg total) by mouth every 8 (eight) hours as needed for nausea or vomiting. 12/05/14   Verlee Monte, MD  predniSONE (DELTASONE) 5 MG tablet Take 5 mg by mouth daily with breakfast.    Historical Provider, MD  sevelamer carbonate (RENVELA) 800 MG tablet Take 800-1,600 mg by mouth 3 (three) times daily with meals. 2 tabs three times daily with  meals, and 1 tablet with snacks    Historical Provider, MD  warfarin (COUMADIN) 6 MG tablet Take 1 tablet (6 mg total) by mouth daily at 6 PM. Take daily until adjusted per MD 02/17/15   Eugenie Filler, MD   Allergies  Allergen Reactions  . Ferrlecit [Na Ferric Gluc Cplx In Sucrose] Shortness Of Breath, Swelling and Other (See Comments)    Swelling in throat  . Darvocet [Propoxyphene N-Acetaminophen] Hives    FAMILY HISTORY:  History reviewed. No pertinent family history. SOCIAL HISTORY:  reports that he has quit smoking. His smoking use included Cigarettes. He smoked 0.00 packs per day for 1 year. He has never used smokeless tobacco. He reports that he does not drink alcohol or use  illicit drugs.  REVIEW OF SYSTEMS:  Unable to obtain, pt on aerosol mask  VITAL SIGNS: Temp:  [97.5 F (36.4 C)] 97.5 F (36.4 C) (01/27 1533) Pulse Rate:  [31-50] 47 (01/27 1730) Resp:  [16-22] 16 (01/27 1730) BP: (126-148)/(73-92) 147/92 mmHg (01/27 1716) SpO2:  [95 %-100 %] 100 % (01/27 1730) HEMODYNAMICS:   VENTILATOR SETTINGS:   INTAKE / OUTPUT: Intake/Output    None     PHYSICAL EXAMINATION: General:  Lethargic appearing Neuro:  Alert, drowsy, on aerosol mask HEENT: Normocephalic Cardiovascular:  Normal rate and rhythm. Pacer pads on. Lungs:  Ronchi and coarse anterior breath sounds Abdomen:  Mildly distended, diffuse abdominal tenderness, normal BS Musculoskeletal:  +1 pitting LE edema Skin: Thin, no rashes   LABS:  CBC  Recent Labs Lab 08/18/15 1548  WBC 6.5  HGB 9.0*  HCT 28.5*  PLT 145*   Coag's No results for input(s): APTT, INR in the last 168 hours. BMET  Recent Labs Lab 08/18/15 1548  NA 142  K >7.5*  CL 106  CO2 23  BUN 99*  CREATININE 13.71*  GLUCOSE 152*   Electrolytes  Recent Labs Lab 08/18/15 1548  CALCIUM 8.9   Sepsis Markers No results for input(s): LATICACIDVEN, PROCALCITON, O2SATVEN in the last 168 hours. ABG No results for input(s):  PHART, PCO2ART, PO2ART in the last 168 hours. Liver Enzymes No results for input(s): AST, ALT, ALKPHOS, BILITOT, ALBUMIN in the last 168 hours. Cardiac Enzymes No results for input(s): TROPONINI, PROBNP in the last 168 hours. Glucose  Recent Labs Lab 08/18/15 1734  GLUCAP 256*    Imaging Dg Chest 2 View  08/18/2015  CLINICAL DATA:  Chest pain and shortness of breath while at dialysis today. EXAM: CHEST  2 VIEW COMPARISON:  08/10/2015. FINDINGS: The cardio pericardial silhouette is enlarged. There is pulmonary vascular congestion without overt pulmonary edema. The lungs are clear wiithout focal pneumonia, pneumothorax or pleural effusion. Right IJ dialysis catheter tip projects at the level of the mid right atrium. The visualized bony structures of the thorax are intact. IMPRESSION: Cardiomegaly with vascular congestion. Electronically Signed   By: Misty Stanley M.D.   On: 08/18/2015 15:57     CXR: See above   ASSESSMENT / PLAN:  PULMONARY A: Pulmonary edema in setting of noncompliance with HD History of tobacco use  P:   Emergent HD  CXR in AM Oxygen therapy to keep SpO2 >92%  CARDIOVASCULAR A: Symptomatic bradycardia with 1st degree AV block in setting of severe hyperkalemia  History of hypertension Combined chronic CHF EF 35-40% on 11/30/14 Hyperlipidemia  P:  Emergent HD Temporary external pads placed Repeat 12-lead EKG in AM Hold home coreq 25 mg BID, clonidine patch, doxazosin 2 mg daily, lisinopril; 20 mg daily, norvasc 10 mg daily, hydralazine 50 mg TID, and imdur  60 mg daily   Hold home lipitor 40 mg daily   RENAL A:  Hyperkalemia with peaked T waves and 1st degree AVB - K >7.5 on admission ESRD with noncompliance  History of failed kidney transplant  P:   Emergent HD Monitor renal function panel  Repeat 12-lead EKG in aM Daily weights and strict I/0's Hold renelva [phone] TID with meals and rena-vit  GASTROINTESTINAL A:  Nausea and abdominal pain in  setting of hyperkalemia  Nutrition  GERD History of inguinal hernia repair  P:   NPO for now, then renal diet  IV protonix 40 mg daily  IV zofran Q 6 hr PRN  HEMATOLOGIC  A:  History of PE/DVT on coumadin  Anemia of renal disease - last Tstat 42% on 11/30/14 DVT Ppx  P:  Coumadin per pharmacy, if unable to take PO then IV heparin   Consider ESA due to Hg <10 Monitor CBC  INFECTIOUS A:  No acute issues P:   Monitor for hospital acquired infection  ENDOCRINE A:  Diet-controlled Type 2 DM  - Last A1c 5.4 on 02/11/15  Secondary hyperparathyroidism  P:   Sensitive SSI Q4 hr then with meals  Goal CBG <180 Hold sensipar 30 mg daily    NEUROLOGIC A:  Acute encephalopathy in setting of severe hyperkalemia Chronic peripheral neuropathy   Anxiety and depression History of narcotic abuse  P:   Emergent HD Hold home xanax 0.5 mg BID PRN  Hold home gabapentin 100 mg TID  Hold home norco 5-325 mg BID PRN Follow-up UDS   [name] MD PGY-3 IMTS Pager [phone]  08/18/2015, 6:09 PM

## 2015-08-18 NOTE — Consult Note (Signed)
[facility] Renal Consultation Note    Indication for Consultation:  Management of ESRD/hemodialysis; anemia, hypertension/volume and secondary hyperparathyroidism  HPI: Frank [name] is a 50 y.o. male.  [name] is a 50yo AAM with PMH sig for DM, HTN, ESRD, CHF, anemia, and noncompliance with dialysis who was sent from his outpatient dialysis center in [facility] to [facility] with SOB and SSCP.  He has a long history of noncompliance with his treatments and states that he hasn't missed this week but upon further questioning he admits that he only stayed for 2.5 hours of a 4 hour treatment.  In the ED he was noted to have bradycardia (HR in the 30's-40's) and hypoxia.  His labs were significant for a potassium level >7.5 and ECG revealed new 1st degree AV block and peaked T waves.  We were asked to perform emergent hemodialysis due to his volume overload and life-threatening hyperkalemia.  While being evaluated his bradycardia worsened with HR in the 20's and he now has an external pacer.  He was given 2 doses of insulin and D50 as well as calcium gluconate and then calcium chloride.  HR is now in the 40's and his BP is stable.  He will be admitted to [facility] and once in the ICU will perform urgent HD to help lower his potassium.  Past Medical History  Diagnosis Date  . Hypertension   . Depression   . Complication of anesthesia     itching, sore throat  . Diabetes mellitus without complication ([place])     No history per patient, but remains under history as A1c would not be accurate given on dialysis  . Shortness of breath   . Anxiety   . ESRD (end stage renal disease) ([place])     due to HTN per patient, followed at [facility], s/p failed kidney transplant - dialysis Tue, Th, Sat  . Renal insufficiency   . CHF (congestive heart failure) ([place])   . Anemia   . Dialysis patient [facility])    Past Surgical History  Procedure Laterality Date  . Kidney receipient  2006    failed and started HD in March  2014  . Capd insertion    . Capd removal    . Left heart catheterization with coronary angiogram N/A 09/02/2014    Procedure: LEFT HEART CATHETERIZATION WITH CORONARY ANGIOGRAM;  Surgeon: [name], MD;  Location: [facility] CATH LAB;  Service: Cardiovascular;  Laterality: N/A;  . Inguinal hernia repair Right 02/14/2015    Procedure: REPAIR INCARCERATED RIGHT INGUINAL HERNIA;  Surgeon: [name], MD;  Location: [place];  Service: General;  Laterality: Right;   Family History:   History reviewed. No pertinent family history. Social History:  reports that he has quit smoking. His smoking use included Cigarettes. He smoked 0.00 packs per day for 1 year. He has never used smokeless tobacco. He reports that he does not drink alcohol or use illicit drugs. Allergies  Allergen Reactions  . Ferrlecit [Na Ferric Gluc Cplx In Sucrose] Shortness Of Breath, Swelling and Other (See Comments)    Swelling in throat  . Darvocet [Propoxyphene N-Acetaminophen] Hives   Prior to Admission medications   Medication Sig Start Date End Date Taking? Authorizing Provider  ALPRAZolam [name]) 0.5 MG tablet Take 0.5 mg by mouth 2 (two) times daily as needed for anxiety.    Historical Provider, MD  amLODipine (NORVASC) 10 MG tablet Take 10 mg by mouth daily.    Historical Provider, MD  atorvastatin (LIPITOR) 40 MG tablet Take 1 tablet (40 mg total) by mouth daily at 6 PM. 09/02/14   Barton Dubois, MD  carvedilol (COREG) 25 MG tablet Take 1 tablet (25 mg total) by mouth 2 (two) times daily with a meal. 10/10/14   [name], DO  cinacalcet (SENSIPAR) 30 MG tablet Take 30 mg by mouth daily.    Historical Provider, MD  cloNIDine (CATAPRES - DOSED IN MG/24 HR) 0.1 mg/24hr patch Place 0.1 mg onto the skin once a week.    Historical Provider, MD  docusate sodium (COLACE) 100 MG capsule Take 1 capsule (100 mg total) by mouth 2 (two) times daily. 11/19/14   [name], MD  doxazosin (CARDURA) 2 MG tablet Take 2 mg by mouth  daily. 06/14/15 09/12/15  Historical Provider, MD  doxercalciferol (HECTOROL) 4 MCG/2ML injection Inject 1.25 mLs (2.5 mcg total) into the vein Every Tuesday,Thursday,and Saturday with dialysis. 10/10/14   [name], DO  gabapentin (NEURONTIN) 100 MG capsule Take 100 mg by mouth 3 (three) times daily. 06/14/15 09/12/15  Historical Provider, MD  hydrALAZINE (APRESOLINE) 50 MG tablet Take 1 tablet (50 mg total) by mouth every 8 (eight) hours. 02/04/15   [name], MD  hydrocerin (EUCERIN) CREA Apply 1 application topically 2 (two) times daily. 02/04/15   [name], MD  HYDROcodone-acetaminophen (NORCO/VICODIN) 5-325 MG tablet Take 1 tablet by mouth 2 (two) times daily as needed for severe pain. 07/18/15   Everlene Balls, MD  isosorbide mononitrate (IMDUR) 60 MG 24 hr tablet Take 60 mg by mouth daily. 06/14/15 09/12/15  Historical Provider, MD  levalbuterol Penne Lash HFA) 45 MCG/ACT inhaler Inhale 2 puffs into the lungs every 6 (six) hours as needed for wheezing or shortness of breath. 11/19/14   [name], MD  lisinopril (PRINIVIL,ZESTRIL) 20 MG tablet Take 1 tablet (20 mg total) by mouth daily. 02/04/15   [name], MD  methocarbamol (ROBAXIN) 500 MG tablet Take 2 tablets (1,000 mg total) by mouth every 8 (eight) hours as needed for muscle spasms. 01/26/15   [name], MD  multivitamin (RENA-VIT) TABS tablet Take 1 tablet by mouth at bedtime. 02/04/15   [name], MD  omeprazole (PRILOSEC) 20 MG capsule Take 20 mg by mouth daily. 07/01/13   Historical Provider, MD  ondansetron (ZOFRAN-ODT) 4 MG disintegrating tablet Take 1 tablet (4 mg total) by mouth every 8 (eight) hours as needed for nausea or vomiting. 12/05/14   Verlee Monte, MD  predniSONE (DELTASONE) 5 MG tablet Take 5 mg by mouth daily with breakfast.    Historical Provider, MD  sevelamer carbonate (RENVELA) 800 MG tablet Take 800-1,600 mg by mouth 3 (three) times daily with meals. 2 tabs three times daily with meals, and 1  tablet with snacks    Historical Provider, MD  warfarin (COUMADIN) 6 MG tablet Take 1 tablet (6 mg total) by mouth daily at 6 PM. Take daily until adjusted per MD 02/17/15   Eugenie Filler, MD   Current Facility-Administered Medications  Medication Dose Route Frequency Provider Last Rate Last Dose  . albuterol (PROVENTIL,VENTOLIN) solution continuous neb  10 mg/hr Nebulization Once Fransico Meadow, PA-C      . calcium gluconate inj 10% (1 g) URGENT USE ONLY!  1 g Intravenous Once Fransico Meadow, PA-C      . dextrose 50 % solution 50 mL  1 ampule Intravenous Once Fransico Meadow, PA-C      .  insulin aspart (novoLOG) injection 10 Units  10 Units Intravenous Once Fransico Meadow, PA-C       Current Outpatient Prescriptions  Medication Sig Dispense Refill  . ALPRAZolam (XANAX) 0.5 MG tablet Take 0.5 mg by mouth 2 (two) times daily as needed for anxiety.    [name] amLODipine (NORVASC) 10 MG tablet Take 10 mg by mouth daily.    [name] atorvastatin (LIPITOR) 40 MG tablet Take 1 tablet (40 mg total) by mouth daily at 6 PM. 30 tablet 1  . carvedilol (COREG) 25 MG tablet Take 1 tablet (25 mg total) by mouth 2 (two) times daily with a meal.    . cinacalcet (SENSIPAR) 30 MG tablet Take 30 mg by mouth daily.    . cloNIDine (CATAPRES - DOSED IN MG/24 HR) 0.1 mg/24hr patch Place 0.1 mg onto the skin once a week.    . docusate sodium (COLACE) 100 MG capsule Take 1 capsule (100 mg total) by mouth 2 (two) times daily. 60 capsule 0  . doxazosin (CARDURA) 2 MG tablet Take 2 mg by mouth daily.    [name] doxercalciferol (HECTOROL) 4 MCG/2ML injection Inject 1.25 mLs (2.5 mcg total) into the vein Every Tuesday,Thursday,and Saturday with dialysis. 2 mL   . gabapentin (NEURONTIN) 100 MG capsule Take 100 mg by mouth 3 (three) times daily.    . hydrALAZINE (APRESOLINE) 50 MG tablet Take 1 tablet (50 mg total) by mouth every 8 (eight) hours. 90 tablet 1  . hydrocerin (EUCERIN) CREA Apply 1 application topically 2 (two) times daily. 454  g 0  . HYDROcodone-acetaminophen (NORCO/VICODIN) 5-325 MG tablet Take 1 tablet by mouth 2 (two) times daily as needed for severe pain. 8 tablet 0  . isosorbide mononitrate (IMDUR) 60 MG 24 hr tablet Take 60 mg by mouth daily.    [name] levalbuterol (XOPENEX HFA) 45 MCG/ACT inhaler Inhale 2 puffs into the lungs every 6 (six) hours as needed for wheezing or shortness of breath. 1 Inhaler 2  . lisinopril (PRINIVIL,ZESTRIL) 20 MG tablet Take 1 tablet (20 mg total) by mouth daily. 30 tablet 1  . methocarbamol (ROBAXIN) 500 MG tablet Take 2 tablets (1,000 mg total) by mouth every 8 (eight) hours as needed for muscle spasms. 20 tablet 0  . multivitamin (RENA-VIT) TABS tablet Take 1 tablet by mouth at bedtime. 30 tablet 1  . omeprazole (PRILOSEC) 20 MG capsule Take 20 mg by mouth daily.    . ondansetron (ZOFRAN-ODT) 4 MG disintegrating tablet Take 1 tablet (4 mg total) by mouth every 8 (eight) hours as needed for nausea or vomiting. 20 tablet 0  . predniSONE (DELTASONE) 5 MG tablet Take 5 mg by mouth daily with breakfast.    . sevelamer carbonate (RENVELA) 800 MG tablet Take 800-1,600 mg by mouth 3 (three) times daily with meals. 2 tabs three times daily with meals, and 1 tablet with snacks    . warfarin (COUMADIN) 6 MG tablet Take 1 tablet (6 mg total) by mouth daily at 6 PM. Take daily until adjusted per MD 20 tablet 0   Labs: Basic Metabolic Panel:  Recent Labs Lab 08/18/15 1548  NA 142  K >7.5*  CL 106  CO2 23  GLUCOSE 152*  BUN 99*  CREATININE 13.71*  CALCIUM 8.9   Liver Function Tests: No results for input(s): AST, ALT, ALKPHOS, BILITOT, PROT, ALBUMIN in the last 168 hours. No results for input(s): LIPASE, AMYLASE in the last 168 hours. No results for input(s): AMMONIA in the last 168  hours. CBC:  Recent Labs Lab 08/18/15 1548  WBC 6.5  HGB 9.0*  HCT 28.5*  MCV 89.6  PLT 145*   Cardiac Enzymes: No results for input(s): CKTOTAL, CKMB, CKMBINDEX, TROPONINI in the last 168  hours. CBG: No results for input(s): GLUCAP in the last 168 hours. Iron Studies: No results for input(s): IRON, TIBC, TRANSFERRIN, FERRITIN in the last 72 hours. Studies/Results: Dg Chest 2 View  08/18/2015  CLINICAL DATA:  Chest pain and shortness of breath while at dialysis today. EXAM: CHEST  2 VIEW COMPARISON:  08/10/2015. FINDINGS: The cardio pericardial silhouette is enlarged. There is pulmonary vascular congestion without overt pulmonary edema. The lungs are clear wiithout focal pneumonia, pneumothorax or pleural effusion. Right IJ dialysis catheter tip projects at the level of the mid right atrium. The visualized bony structures of the thorax are intact. IMPRESSION: Cardiomegaly with vascular congestion. Electronically Signed   By: Misty Stanley M.D.   On: 08/18/2015 15:57    ROS: Pertinent items are noted in HPI. Physical Exam: Filed Vitals:   08/18/15 1533 08/18/15 1630  BP: 148/85 126/73  Pulse: 50   Temp: 97.5 F (36.4 C)   TempSrc: Oral   Resp: 18 16  SpO2: 100% 99%      Weight change:  No intake or output data in the 24 hours ending 08/18/15 1707 BP 126/73 mmHg  Pulse 50  Temp(Src) 97.5 F (36.4 C) (Oral)  Resp 16  SpO2 99% General appearance: fatigued, moderate distress and slowed mentation Head: Normocephalic, without obvious abnormality, atraumatic Resp: diminished breath sounds LLL, rales bilaterally and rhonchi bilaterally Cardio: bradycardic, no rub GI: distended and using accessory muscles with paradoxical breathing and +BS, soft, somewhat distended Extremities: edema 2+ pitting edema of lower extremities Dialysis Access:  Dialysis Orders: Center: [facility]  on TTS . Rest of dialysis information is not available as the dialysis unit is closed at this time  Assessment/Plan: 1.  Hyperkalemia- currently life-threatening with peaked T waves, bradycardia, and 1st degree AV block.  Plan for emergent HD with low K bath and follow 2. Noncardiogenic  pulmonary edema- related to nonadherence with HD prescription and schedule  3. Bradycardia- now with 1st degree AV block, likely related to severe hyperkalemia.  Treated with IV Insulin/D50 x 2, external pacemaker, IV calcium chloride and plan for emergent HD and follow in the ICU 4. ESRD -  Chronically noncompliant with hemodialysis.  Plan for HD today and will likely require it again tomorrow. 5.  Hypertension/volume  - plan for UF with HD 6.  Anemia  - will follow and dose with ESA 7.  Metabolic bone disease -  Continue with outpatient meds and follow Calcium and phosphorus. 8.  Nutrition - per primary 9. Narcotics abuse- ongoing issue 10. Vascular access- has RIJ TDC.  Donetta Potts, MD [place] Pager [phone] 08/18/2015, 5:07 PM

## 2015-08-18 NOTE — ED Notes (Signed)
Pt went for dialysis today, unable to finish treatment due to sob and nausea. No resp distress noted at this time, ekg done.

## 2015-08-18 NOTE — ED Provider Notes (Signed)
CSN: [id number]     Arrival date & time 08/18/15  1526 History   First MD Initiated Contact with Patient 08/18/15 1549     Chief Complaint  Patient presents with  . Shortness of Breath  . Nausea     (Consider location/radiation/quality/duration/timing/severity/associated sxs/prior Treatment) Patient is a 50 y.o. male presenting with shortness of breath. The history is provided by the patient. No language interpreter was used.  Shortness of Breath Severity:  Moderate Onset quality:  Gradual Duration:  1 day Timing:  Constant Progression:  Worsening Chronicity:  New Relieved by:  Nothing Ineffective treatments:  None tried Associated symptoms: no abdominal pain and no wheezing    Pt reports he went to dialysis at [facility].  Pt reports he was to weak and he was sent here for evaluation of potassium level and for dialysis.   Pt complains of some shortness of breath.  Past Medical History  Diagnosis Date  . Hypertension   . Depression   . Complication of anesthesia     itching, sore throat  . Diabetes mellitus without complication ([place])     No history per patient, but remains under history as A1c would not be accurate given on dialysis  . Shortness of breath   . Anxiety   . ESRD (end stage renal disease) ([place])     due to HTN per patient, followed at [facility], s/p failed kidney transplant - dialysis Tue, Th, Sat  . Renal insufficiency   . CHF (congestive heart failure) ([place])   . Anemia   . Dialysis patient [facility])    Past Surgical History  Procedure Laterality Date  . Kidney receipient  2006    failed and started HD in March 2014  . Capd insertion    . Capd removal    . Left heart catheterization with coronary angiogram N/A 09/02/2014    Procedure: LEFT HEART CATHETERIZATION WITH CORONARY ANGIOGRAM;  Surgeon: [name], MD;  Location: [facility] CATH LAB;  Service: Cardiovascular;  Laterality: N/A;  . Inguinal hernia repair Right 02/14/2015    Procedure: REPAIR INCARCERATED  RIGHT INGUINAL HERNIA;  Surgeon: [name], MD;  Location: [place];  Service: General;  Laterality: Right;   History reviewed. No pertinent family history. Social History  Substance Use Topics  . Smoking status: Former Smoker -- 0.00 packs/day for 1 years    Types: Cigarettes  . Smokeless tobacco: Never Used     Comment: quit Jan 2014  . Alcohol Use: No    Review of Systems  Respiratory: Positive for shortness of breath. Negative for wheezing.   Cardiovascular: Positive for leg swelling.  Gastrointestinal: Negative for abdominal pain.  All other systems reviewed and are negative.     Allergies  Ferrlecit and Darvocet  Home Medications   Prior to Admission medications   Medication Sig Start Date End Date Taking? Authorizing Provider  ALPRAZolam [name]) 0.5 MG tablet Take 0.5 mg by mouth 2 (two) times daily as needed for anxiety.    Historical Provider, MD  amLODipine (NORVASC) 10 MG tablet Take 10 mg by mouth daily.    Historical Provider, MD  atorvastatin (LIPITOR) 40 MG tablet Take 1 tablet (40 mg total) by mouth daily at 6 PM. 09/02/14   Barton Dubois, MD  carvedilol (COREG) 25 MG tablet Take 1 tablet (25 mg total) by mouth 2 (two) times daily with a meal. 10/10/14   [name], DO  cinacalcet (SENSIPAR) 30 MG tablet Take 30 mg by mouth  daily.    Historical Provider, MD  cloNIDine (CATAPRES - DOSED IN MG/24 HR) 0.1 mg/24hr patch Place 0.1 mg onto the skin once a week.    Historical Provider, MD  docusate sodium (COLACE) 100 MG capsule Take 1 capsule (100 mg total) by mouth 2 (two) times daily. 11/19/14   [name], MD  doxazosin (CARDURA) 2 MG tablet Take 2 mg by mouth daily. 06/14/15 09/12/15  Historical Provider, MD  doxercalciferol (HECTOROL) 4 MCG/2ML injection Inject 1.25 mLs (2.5 mcg total) into the vein Every Tuesday,Thursday,and Saturday with dialysis. 10/10/14   [name], DO  gabapentin (NEURONTIN) 100 MG capsule Take 100 mg by mouth 3 (three) times daily.  06/14/15 09/12/15  Historical Provider, MD  hydrALAZINE (APRESOLINE) 50 MG tablet Take 1 tablet (50 mg total) by mouth every 8 (eight) hours. 02/04/15   [name], MD  hydrocerin (EUCERIN) CREA Apply 1 application topically 2 (two) times daily. 02/04/15   [name], MD  HYDROcodone-acetaminophen (NORCO/VICODIN) 5-325 MG tablet Take 1 tablet by mouth 2 (two) times daily as needed for severe pain. 07/18/15   Everlene Balls, MD  isosorbide mononitrate (IMDUR) 60 MG 24 hr tablet Take 60 mg by mouth daily. 06/14/15 09/12/15  Historical Provider, MD  levalbuterol Penne Lash HFA) 45 MCG/ACT inhaler Inhale 2 puffs into the lungs every 6 (six) hours as needed for wheezing or shortness of breath. 11/19/14   [name], MD  lisinopril (PRINIVIL,ZESTRIL) 20 MG tablet Take 1 tablet (20 mg total) by mouth daily. 02/04/15   [name], MD  methocarbamol (ROBAXIN) 500 MG tablet Take 2 tablets (1,000 mg total) by mouth every 8 (eight) hours as needed for muscle spasms. 01/26/15   [name], MD  multivitamin (RENA-VIT) TABS tablet Take 1 tablet by mouth at bedtime. 02/04/15   [name], MD  omeprazole (PRILOSEC) 20 MG capsule Take 20 mg by mouth daily. 07/01/13   Historical Provider, MD  ondansetron (ZOFRAN-ODT) 4 MG disintegrating tablet Take 1 tablet (4 mg total) by mouth every 8 (eight) hours as needed for nausea or vomiting. 12/05/14   Verlee Monte, MD  predniSONE (DELTASONE) 5 MG tablet Take 5 mg by mouth daily with breakfast.    Historical Provider, MD  sevelamer carbonate (RENVELA) 800 MG tablet Take 800-1,600 mg by mouth 3 (three) times daily with meals. 2 tabs three times daily with meals, and 1 tablet with snacks    Historical Provider, MD  warfarin (COUMADIN) 6 MG tablet Take 1 tablet (6 mg total) by mouth daily at 6 PM. Take daily until adjusted per MD 02/17/15   Eugenie Filler, MD   BP 148/85 mmHg  Pulse 50  Temp(Src) 97.5 F (36.4 C) (Oral)  Resp 18  SpO2 100% Physical Exam   Constitutional: He is oriented to person, place, and time. He appears well-developed and well-nourished.  HENT:  Head: Normocephalic and atraumatic.  Eyes: EOM are normal. Pupils are equal, round, and reactive to light.  Neck: Normal range of motion.  Cardiovascular: Normal rate and intact distal pulses.   Bradycardia 45-50.  Heart rate noted to have dropped to 39 on EKg.   Pulmonary/Chest: Effort normal. He has no wheezes.  Abdominal: Soft. He exhibits no distension.  Musculoskeletal: Normal range of motion.  Neurological: He is alert and oriented to person, place, and time.  Skin: Skin is warm.  Psychiatric: He has a normal mood and affect.  Vitals reviewed.   ED Course  Procedures (  including critical care time) Labs Review Labs Reviewed  BASIC METABOLIC PANEL - Abnormal; Notable for the following:    Glucose, Bld 152 (*)    BUN 99 (*)    Creatinine, Ser 13.71 (*)    GFR calc non Af Amer 4 (*)    GFR calc Af Amer 4 (*)    All other components within normal limits  CBC - Abnormal; Notable for the following:    RBC 3.18 (*)    Hemoglobin 9.0 (*)    HCT 28.5 (*)    RDW 15.8 (*)    Platelets 145 (*)    All other components within normal limits  I-STAT TROPOININ, ED    Imaging Review Dg Chest 2 View  08/18/2015  CLINICAL DATA:  Chest pain and shortness of breath while at dialysis today. EXAM: CHEST  2 VIEW COMPARISON:  08/10/2015. FINDINGS: The cardio pericardial silhouette is enlarged. There is pulmonary vascular congestion without overt pulmonary edema. The lungs are clear wiithout focal pneumonia, pneumothorax or pleural effusion. Right IJ dialysis catheter tip projects at the level of the mid right atrium. The visualized bony structures of the thorax are intact. IMPRESSION: Cardiomegaly with vascular congestion. Electronically Signed   By: Misty Stanley M.D.   On: 08/18/2015 15:57   I have personally reviewed and evaluated these images and lab results as part of my medical  decision-making.   EKG Interpretation   Date/Time:  Friday August 18 2015 15:33:42 EST Ventricular Rate:  36 PR Interval:  292 QRS Duration: 140 QT Interval:  564 QTC Calculation: 436 R Axis:   -47 Text Interpretation:  Marked sinus bradycardia with 1st degree A-V block  with Premature atrial complexes in a pattern of bigeminy Left axis  deviation Non-specific intra-ventricular conduction block Inferior infarct  , age undetermined Cannot rule out Anterior infarct , age undetermined  Abnormal ECG bradycardia and 1st degree block new from previous, peaked T  waves Confirmed by LITTLE MD, RACHEL [phone]) on 08/18/2015 4:43:31 PM     CRITICAL CARE Performed by: IRSWN,IOEVO Total critical care time: 45 minutes Critical care time was exclusive of separately billable procedures and treating other patients. Critical care was necessary to treat or prevent imminent or life-threatening deterioration. Critical care was time spent personally by me on the following activities: development of treatment plan with patient and/or surrogate as well as nursing, discussions with consultants, evaluation of patient's response to treatment, examination of patient, obtaining history from patient or surrogate, ordering and performing treatments and interventions, ordering and review of laboratory studies, ordering and review of radiographic studies, pulse oximetry and re-evaluation of patient's condition.  MDM  Pt has K of greater than 7.5.  5;00 p,   Rn reports decrease in heart rate.  Pt dropped to 29-30.  Dialysis Rn was able to access dialysis catheter.  Ultrasound Iv obtained.  Pt started on albuterol 10 continous neb. Pt given insulin, calcium , dextrose,  Pacer pad placed.   Dr. Rex Kras at bedside.  She spoke to Dr. Kathe Mariner.   Dr. Raynelle Chary will set up dialysis.  I spoke to critical care MD Dr. Corinna Lines who advised ICu admit to Dr. Chase Caller.   Pt's heart rate 50's.     Final diagnoses:  Hyperkalemia  Renal  failure  Bradycardia    Admit to [place], PA-C 08/18/15 [place], MD 08/20/15 [phone]

## 2015-08-18 NOTE — Progress Notes (Addendum)
ANTICOAGULATION CONSULT NOTE - Initial Consult  Pharmacy Consult for Coumadin Indication: Hx DVT/PE  Allergies  Allergen Reactions  . Butalbital-Apap-Caffeine Shortness Of Breath and Swelling    Swelling in throat  . Ferrlecit [Na Ferric Gluc Cplx In Sucrose] Shortness Of Breath, Swelling and Other (See Comments)    Swelling in throat  . Minoxidil Shortness Of Breath  . Darvocet [Propoxyphene N-Acetaminophen] Hives  . Propoxyphene Hives    Allergy listed on file from [facility] (Pt did not mention any allergies)     Patient Measurements:   Vital Signs: Temp: 97.3 F (36.3 C) (01/27 2016) Temp Source: Oral (01/27 2016) BP: 155/92 mmHg (01/27 2030) Pulse Rate: 67 (01/27 2030) Labs:  Recent Labs  08/18/15 1548  HGB 9.0*  HCT 28.5*  PLT 145*  CREATININE 13.71*   CrCl cannot be calculated (Unknown ideal weight.).  Assessment: 50 year old male with history of DVT and PE in October 2015 admitted with severe hyperkalemia receiving emergent HD. Pharmacy consulted to continue Coumadin. Patient reported last dose of Coumadin being in the past week - history of noncompliance. No recent INR. Currently patient is in dialysis and unable to obtain INR. Also, patient very lethargic and RN concerned for po intake ability. Discussed with [facility]. Oletta Darter at [facility] - He verbally gave ok to delay initial INR check until after HD and therefore delay start of therapy. He will order and NG tube to be placed for oral access.   Goal of Therapy:  INR 2-3 Monitor platelets by anticoagulation protocol: Yes   Plan:  INR check after HD at 2330 PM to determine if ok to re-dose Coumadin.  Follow-up NG tube placement.  Daily PT/INR while on therapy.   Sloan Leiter, PharmD, BCPS Clinical Pharmacist [phone]  08/18/2015,8:46 PM    Initial INR = 1.45 Plan: Coumadin 7.5 mg po x 1 tonight Daily INR  Thank you Anette Guarneri, PharmD [phone] 08/19/15 1230 AM

## 2015-08-18 NOTE — ED Notes (Signed)
Pa at MD at bedside.  Zoll pads on.

## 2015-08-18 NOTE — ED Notes (Signed)
Nephrology at bedside

## 2015-08-19 ENCOUNTER — Inpatient Hospital Stay (HOSPITAL_COMMUNITY): Payer: Medicare Other

## 2015-08-19 DIAGNOSIS — R001 Bradycardia, unspecified: Secondary | ICD-10-CM

## 2015-08-19 DIAGNOSIS — E875 Hyperkalemia: Secondary | ICD-10-CM

## 2015-08-19 LAB — CBC
HEMATOCRIT: 24.7 % — AB (ref 39.0–52.0)
Hemoglobin: 8.2 g/dL — ABNORMAL LOW (ref 13.0–17.0)
MCH: 29 pg (ref 26.0–34.0)
MCHC: 33.2 g/dL (ref 30.0–36.0)
MCV: 87.3 fL (ref 78.0–100.0)
PLATELETS: 138 10*3/uL — AB (ref 150–400)
RBC: 2.83 MIL/uL — AB (ref 4.22–5.81)
RDW: 15.5 % (ref 11.5–15.5)
WBC: 6.6 10*3/uL (ref 4.0–10.5)

## 2015-08-19 LAB — GLUCOSE, CAPILLARY
GLUCOSE-CAPILLARY: 104 mg/dL — AB (ref 65–99)
Glucose-Capillary: 128 mg/dL — ABNORMAL HIGH (ref 65–99)
Glucose-Capillary: 170 mg/dL — ABNORMAL HIGH (ref 65–99)
Glucose-Capillary: 61 mg/dL — ABNORMAL LOW (ref 65–99)
Glucose-Capillary: 87 mg/dL (ref 65–99)

## 2015-08-19 LAB — BASIC METABOLIC PANEL
Anion gap: 14 (ref 5–15)
BUN: 34 mg/dL — AB (ref 6–20)
CHLORIDE: 97 mmol/L — AB (ref 101–111)
CO2: 26 mmol/L (ref 22–32)
Calcium: 8.4 mg/dL — ABNORMAL LOW (ref 8.9–10.3)
Creatinine, Ser: 7.73 mg/dL — ABNORMAL HIGH (ref 0.61–1.24)
GFR calc Af Amer: 8 mL/min — ABNORMAL LOW (ref >60)
GFR calc non Af Amer: 7 mL/min — ABNORMAL LOW (ref >60)
GLUCOSE: 135 mg/dL — AB (ref 65–99)
POTASSIUM: 5.3 mmol/L — AB (ref 3.5–5.1)
Sodium: 137 mmol/L (ref 135–145)

## 2015-08-19 LAB — RENAL FUNCTION PANEL
ALBUMIN: 2.9 g/dL — AB (ref 3.5–5.0)
Anion gap: 15 (ref 5–15)
BUN: 30 mg/dL — ABNORMAL HIGH (ref 6–20)
CHLORIDE: 97 mmol/L — AB (ref 101–111)
CO2: 26 mmol/L (ref 22–32)
Calcium: 8.8 mg/dL — ABNORMAL LOW (ref 8.9–10.3)
Creatinine, Ser: 5.97 mg/dL — ABNORMAL HIGH (ref 0.61–1.24)
GFR calc Af Amer: 11 mL/min — ABNORMAL LOW (ref >60)
GFR calc non Af Amer: 10 mL/min — ABNORMAL LOW (ref >60)
Glucose, Bld: 65 mg/dL (ref 65–99)
PHOSPHORUS: 2.7 mg/dL (ref 2.5–4.6)
POTASSIUM: 3.9 mmol/L (ref 3.5–5.1)
Sodium: 138 mmol/L (ref 135–145)

## 2015-08-19 LAB — PROTIME-INR
INR: 1.45 (ref 0.00–1.49)
PROTHROMBIN TIME: 17.8 s — AB (ref 11.6–15.2)

## 2015-08-19 LAB — MAGNESIUM: Magnesium: 1.7 mg/dL (ref 1.7–2.4)

## 2015-08-19 LAB — TROPONIN I: TROPONIN I: 0.05 ng/mL — AB (ref <0.031)

## 2015-08-19 MED ORDER — INSULIN ASPART 100 UNIT/ML ~~LOC~~ SOLN
0.0000 [IU] | Freq: Three times a day (TID) | SUBCUTANEOUS | Status: DC
  Administered 2015-08-19: 1 [IU] via SUBCUTANEOUS
  Administered 2015-08-20: 2 [IU] via SUBCUTANEOUS

## 2015-08-19 MED ORDER — INSULIN ASPART 100 UNIT/ML ~~LOC~~ SOLN: 0.0000 [IU] | SUBCUTANEOUS | Status: DC

## 2015-08-19 MED ORDER — ATORVASTATIN CALCIUM 40 MG PO TABS
40.0000 mg | ORAL_TABLET | Freq: Every day | ORAL | Status: DC
Start: 2015-08-19 — End: 2015-08-20
  Administered 2015-08-19 – 2015-08-20 (×2): 40 mg via ORAL
  Filled 2015-08-19 (×3): qty 1

## 2015-08-19 MED ORDER — IBUPROFEN 400 MG PO TABS
400.0000 mg | ORAL_TABLET | Freq: Four times a day (QID) | ORAL | Status: DC | PRN
  Filled 2015-08-19: qty 1

## 2015-08-19 MED ORDER — GABAPENTIN 100 MG PO CAPS
100.0000 mg | ORAL_CAPSULE | Freq: Three times a day (TID) | ORAL | Status: DC
Start: 2015-08-19 — End: 2015-08-20
  Administered 2015-08-19 – 2015-08-20 (×4): 100 mg via ORAL
  Filled 2015-08-19 (×6): qty 1

## 2015-08-19 MED ORDER — WARFARIN SODIUM 6 MG PO TABS
6.0000 mg | ORAL_TABLET | Freq: Once | ORAL | Status: AC
  Administered 2015-08-19: 6 mg via ORAL
  Filled 2015-08-19: qty 1

## 2015-08-19 MED ORDER — PREDNISONE 5 MG PO TABS
5.0000 mg | ORAL_TABLET | Freq: Every day | ORAL | Status: DC
  Administered 2015-08-19 – 2015-08-20 (×2): 5 mg via ORAL
  Filled 2015-08-19 (×3): qty 1

## 2015-08-19 MED ORDER — ACETAMINOPHEN 325 MG PO TABS
650.0000 mg | ORAL_TABLET | Freq: Four times a day (QID) | ORAL | Status: DC | PRN
  Administered 2015-08-19: 650 mg via ORAL
  Filled 2015-08-19: qty 2

## 2015-08-19 MED ORDER — MORPHINE SULFATE (PF) 2 MG/ML IV SOLN
2.0000 mg | Freq: Once | INTRAVENOUS | Status: AC
  Administered 2015-08-19: 2 mg via INTRAVENOUS
  Filled 2015-08-19: qty 1

## 2015-08-19 MED ORDER — ALPRAZOLAM 0.5 MG PO TABS
0.5000 mg | ORAL_TABLET | Freq: Once | ORAL | Status: AC
  Administered 2015-08-19: 0.5 mg via ORAL
  Filled 2015-08-19: qty 1

## 2015-08-19 MED ORDER — WARFARIN - PHARMACIST DOSING INPATIENT: Freq: Every day | Status: DC

## 2015-08-19 MED ORDER — WARFARIN SODIUM 7.5 MG PO TABS
7.5000 mg | ORAL_TABLET | Freq: Once | ORAL | Status: AC
Start: 2015-08-19 — End: 2015-08-19
  Administered 2015-08-19: 7.5 mg via ORAL
  Filled 2015-08-19: qty 1

## 2015-08-19 MED ORDER — INSULIN ASPART 100 UNIT/ML ~~LOC~~ SOLN: 0.0000 [IU] | Freq: Every day | SUBCUTANEOUS | Status: DC

## 2015-08-19 NOTE — Progress Notes (Signed)
Patient ID: Frank [name], male   DOB: 12/10/1965, 50 y.o.   MRN: [id number]  [facility] Progress Note    Subjective:   Pt feeling better    Objective:   BP 161/97 mmHg  Pulse 72  Temp(Src) 99.1 F (37.3 C) (Oral)  Resp 36  Ht _0  (1.88 m)  Wt 80 kg (176 lb 5.9 oz)  BMI 22.63 kg/m2  SpO2 92%  Intake/Output: I/O last 3 completed shifts: In: 270 [P.O.:240; Other:30] Out: 4000 [Other:4000]   Intake/Output this shift:    Weight change:   Physical Exam: Gen:WD WN AAM CVS:no rub Resp:scattered rhonchi EHU:DJSHFW Ext:1+ edema  Labs: BMET  Recent Labs Lab 08/18/15 1548 08/19/15 0026  NA 142 138  K >7.5* 3.9  CL 106 97*  CO2 23 26  GLUCOSE 152* 65  BUN 99* 30*  CREATININE 13.71* 5.97*  ALBUMIN  --  2.9*  CALCIUM 8.9 8.8*  PHOS  --  2.7   CBC  Recent Labs Lab 08/18/15 1548 08/19/15 0026  WBC 6.5 6.6  HGB 9.0* 8.2*  HCT 28.5* 24.7*  MCV 89.6 87.3  PLT 145* 138*    _1 @ Medications:    . amLODipine  10 mg Oral Daily  . atorvastatin  40 mg Oral q1800  . calcium gluconate  1 g Intravenous Once  . carvedilol  25 mg Oral BID WC  . gabapentin  100 mg Oral TID  . hydrALAZINE  50 mg Oral 3 times per day  . insulin aspart  0-5 Units Subcutaneous QHS  . insulin aspart  0-9 Units Subcutaneous TID WC  . isosorbide mononitrate  60 mg Oral Daily  . lisinopril  20 mg Oral Daily  . pantoprazole  40 mg Oral Daily  . Warfarin - Pharmacist Dosing Inpatient   Does not apply q1800   Dialysis Orders: Center: [facility] on [facility]. 3 hours, bfr350, dfr 800, 2K 2.5Ca, edw 161   Assessment/ Plan:    1. Hyperkalemia- life-threatening with peaked T waves, bradycardia, and 1st degree AV block due to patient's noncompliance. Improved after emergent HD with low K bath. 2. Noncardiogenic pulmonary edema- related to nonadherence with HD prescription and schedule  3. Bradycardia- now with 1st degree AV block, likely related to severe  hyperkalemia. Treated with IV Insulin/D50 x 2, external pacemaker, IV calcium chloride and plan for emergent HD and follow in the ICU 4. ESRD - Chronically noncompliant with hemodialysis. Plan for HD again today for volume removal (short treatment) He is supposed to be on 4 treatments per week of 3 hours but does not adhere to this and is in and out of different hospitals. 5. Hypertension/volume - plan for UF with HD 6. Anemia - will follow and no ESA for now due to concern over 2.5 cm renal mass 7. Metabolic bone disease - Continue with outpatient meds and follow Calcium and phosphorus. 8. Nutrition - per primary 9. Narcotics abuse- ongoing issue 10. Vascular access- has RIJ TDC. 11. Renal Mass- 2.5cm and no ESA due to concern over malignancy 12. Disposition- pt has been repeatedly noncompliant and goes from hospital to hospital and blatantly lies about treatments.  He stated his dialysis unit told him to come on Friday, but in fact he was admitted to [facility] and discharged on the 24th and never showed up for dialysis on the 2022-10-07 th.  He almost died, again.  Patient should be evaluated by psychiatry as he is clearly a danger to himself.  Frank [name]  Denice Paradise, MD [place] Pager [phone] 08/19/2015, 8:38 AM

## 2015-08-19 NOTE — Progress Notes (Signed)
ANTICOAGULATION CONSULT NOTE - Initial Consult  Pharmacy Consult for warfarin Indication: hx of DVT/PE  Allergies  Allergen Reactions  . Butalbital-Apap-Caffeine Shortness Of Breath and Swelling    Swelling in throat  . Ferrlecit [Na Ferric Gluc Cplx In Sucrose] Shortness Of Breath, Swelling and Other (See Comments)    Swelling in throat  . Minoxidil Shortness Of Breath  . Darvocet [Propoxyphene N-Acetaminophen] Hives  . Propoxyphene Hives    Allergy listed on file from [facility] (Pt did not mention any allergies)     Patient Measurements: Height: _0  (188 cm) Weight: 176 lb 5.9 oz (80 kg) IBW/kg (Calculated) : 82.2 Heparin Dosing Weight:   Vital Signs: Temp: 99.1 F (37.3 C) (01/28 0824) Temp Source: Oral (01/28 0824) BP: 160/108 mmHg (01/28 0916) Pulse Rate: 80 (01/28 0900)  Labs:  Recent Labs  08/18/15 1548 08/18/15 2330 08/19/15 0026  HGB 9.0*  --  8.2*  HCT 28.5*  --  24.7*  PLT 145*  --  138*  LABPROT  --  17.8*  --   INR  --  1.45  --   CREATININE 13.71*  --  5.97*  TROPONINI  --   --  0.05*    Estimated Creatinine Clearance: 16.6 mL/min (by C-G formula based on Cr of 5.97).   Medical History: Past Medical History  Diagnosis Date  . Hypertension   . Depression   . Complication of anesthesia     itching, sore throat  . Diabetes mellitus without complication ([place])     No history per patient, but remains under history as A1c would not be accurate given on dialysis  . Shortness of breath   . Anxiety   . ESRD (end stage renal disease) ([place])     due to HTN per patient, followed at [facility], s/p failed kidney transplant - dialysis Tue, Th, Sat  . Renal insufficiency   . CHF (congestive heart failure) ([place])   . Anemia   . Dialysis patient [facility])     Medications:  Prescriptions prior to admission  Medication Sig Dispense Refill Last Dose  . ALPRAZolam (XANAX) 0.5 MG tablet Take 0.5 mg by mouth 2 (two) times daily as needed for anxiety.   Past  Week at Unknown time  . amLODipine (NORVASC) 10 MG tablet Take 10 mg by mouth daily.   Past Week at Unknown time  . atorvastatin (LIPITOR) 40 MG tablet Take 1 tablet (40 mg total) by mouth daily at 6 PM. 30 tablet 1 Past Week at Unknown time  . carvedilol (COREG) 25 MG tablet Take 1 tablet (25 mg total) by mouth 2 (two) times daily with a meal.   Past Week at 2200  . cinacalcet (SENSIPAR) 30 MG tablet Take 30 mg by mouth daily.   Past Week at Unknown time  . cloNIDine (CATAPRES - DOSED IN MG/24 HR) 0.1 mg/24hr patch Place 0.1 mg onto the skin once a week.   Past Week at Unknown time  . docusate sodium (COLACE) 100 MG capsule Take 1 capsule (100 mg total) by mouth 2 (two) times daily. 60 capsule 0 Past Week at Unknown time  . doxazosin (CARDURA) 2 MG tablet Take 2 mg by mouth daily.   Past Week at Unknown time  . doxercalciferol (HECTOROL) 4 MCG/2ML injection Inject 1.25 mLs (2.5 mcg total) into the vein Every Tuesday,Thursday,and Saturday with dialysis. 2 mL  Past Week at Unknown time  . gabapentin (NEURONTIN) 100 MG capsule Take 100 mg by mouth 3 (  three) times daily.   Past Week at Unknown time  . hydrALAZINE (APRESOLINE) 50 MG tablet Take 1 tablet (50 mg total) by mouth every 8 (eight) hours. 90 tablet 1 Past Week at Unknown time  . hydrocerin (EUCERIN) CREA Apply 1 application topically 2 (two) times daily. 454 g 0 Past Week at Unknown time  . HYDROcodone-acetaminophen (NORCO/VICODIN) 5-325 MG tablet Take 1 tablet by mouth 2 (two) times daily as needed for severe pain. 8 tablet 0 unknown  . isosorbide mononitrate (IMDUR) 60 MG 24 hr tablet Take 60 mg by mouth daily.   Past Week at Unknown time  . levalbuterol (XOPENEX HFA) 45 MCG/ACT inhaler Inhale 2 puffs into the lungs every 6 (six) hours as needed for wheezing or shortness of breath. 1 Inhaler 2 Past Week at Unknown time  . lisinopril (PRINIVIL,ZESTRIL) 20 MG tablet Take 1 tablet (20 mg total) by mouth daily. 30 tablet 1 Past Week at Unknown  time  . multivitamin (RENA-VIT) TABS tablet Take 1 tablet by mouth at bedtime. 30 tablet 1 Past Week at Unknown time  . omeprazole (PRILOSEC) 20 MG capsule Take 20 mg by mouth daily.   Past Week at Unknown time  . predniSONE (DELTASONE) 5 MG tablet Take 5 mg by mouth daily with breakfast.   Past Week at Unknown time  . sevelamer carbonate (RENVELA) 800 MG tablet Take 800-1,600 mg by mouth 3 (three) times daily with meals. 2 tabs three times daily with meals, and 1 tablet with snacks   Past Week at Unknown time  . warfarin (COUMADIN) 6 MG tablet Take 1 tablet (6 mg total) by mouth daily at 6 PM. Take daily until adjusted per MD 20 tablet 0 Past Week at Unknown time  . methocarbamol (ROBAXIN) 500 MG tablet Take 2 tablets (1,000 mg total) by mouth every 8 (eight) hours as needed for muscle spasms. 20 tablet 0 Past Month at Unknown time  . ondansetron (ZOFRAN-ODT) 4 MG disintegrating tablet Take 1 tablet (4 mg total) by mouth every 8 (eight) hours as needed for nausea or vomiting. 20 tablet 0 unk    Assessment: 50 yo male with hx of DVT/PE admitted for urgent HD secondary to hyperkalemia. Pt has a history of noncompliance, PTA dose: 6 mg po daily. INR 1.45 yesterday evening.  Goal of Therapy:  INR 2-3 Monitor platelets by anticoagulation protocol: Yes   Plan:  -Warfarin 6 mg po x1 -Daily INR   Frank [name], Frank [name] 08/19/2015,10:24 AM

## 2015-08-19 NOTE — Progress Notes (Signed)
Noted psychiatric consult for possible risk to self. Discussed with Dr. Ashok Cordia via telephone to clarify need for sitter. No sitter needed. Will continue to monitor. Bartholomew Crews, RN

## 2015-08-19 NOTE — Progress Notes (Signed)
Per Nephrology recommendations Psychiatry Consult called by Dr. Ashok Cordia at [facility] 08/19/15.  Sonia Baller Ashok Cordia, M.D. [facility] Pager:  [phone] After 3pm or if no response, call [phone]

## 2015-08-19 NOTE — Progress Notes (Signed)
[place] Progress Note Patient Name: Frank [name] DOB: [birth year] MRN: [id number]   Date of Service  08/19/2015  HPI/Events of Note  Patient on Xanax 0.5 mg PO BID at home. Wants home med.   eICU Interventions  Will order Xanax 0.5 mg PO now X 1.     Intervention Category Minor Interventions: Agitation / anxiety - evaluation and management  Sommer,Steven Eugene 08/19/2015, 11:05 PM

## 2015-08-19 NOTE — Progress Notes (Signed)
[name] Progress Note Patient Name: Frank [name] DOB: 1965/08/15 MRN: [id number]   Date of Service  08/19/2015  HPI/Events of Note  Patient c/o headache. No relief with Tylenol.   eICU Interventions  Will order Motrin 400 mg PO Q 6 hours PRN HA.     Intervention Category Intermediate Interventions: Pain - evaluation and management  Sommer,Steven Cornelia [place] 08/19/2015, 5:47 PM

## 2015-08-19 NOTE — Progress Notes (Signed)
PULMONARY / CRITICAL CARE MEDICINE   Name: Frank [name] MRN: [id number] DOB: [birth year]    ADMISSION DATE:  08/18/2015 CONSULTATION DATE:  08/18/15  REFERRING MD :  EDP PRIMARY SERVICE: PCCM  CHIEF COMPLAINT:  Hyperkalemia   BRIEF PATIENT DESCRIPTION:  50 yo M with ESRD noncompliant with HD who presented from his outpatient HD center in [place] [facility] with dyspnea and substernal chest pain found to have hyperkalemia with K of >7.5 with peaked t-waves, pulmonary edema, and new 1st heart block requiring emergent dialysis.     SIGNIFICANT EVENTS:  1/27 - Admitted with hyperkalemia requiring emergent HD  STUDIES: Port CXR 1/27: cardiomegaly and vascular congestion EKG 1/27: marked sinus bradycardia with HR 36 and marked 1st degree heart block. Noted to be in 20's per ED nursing note.  EKG 1/28: Normal sinus rhythm. LAE. QTc 428m. Port CXR 1/28: Personally reviewed by me. Suggestion of cardiomegaly. R IJ catheter in good position. No significant change.  LINES / TUBES: Permanent right HD double lumen catheter PIV x1  CULTURES: MRSA PCR 1/27:  Pending  ANTIBIOTICS: None  SUBJECTIVE: Patient underwent emergent hemodialysis last night. Reports significant improvement in right-sided pleuritic chest discomfort as well as resolution of abdominal pain and nausea. Denies any new chest pain.  REVIEW OF SYSTEMS:  Denies any dyspnea or cough currently. Denies any subjective fever, chills, or sweats.  VITAL SIGNS: Temp:  [97.3 F (36.3 C)-99.1 F (37.3 C)] 98.7 F (37.1 C) (01/28 0350) Pulse Rate:  [31-85] 72 (01/28 0715) Resp:  [3-36] 36 (01/28 0545) BP: (126-206)/(73-133) 161/97 mmHg (01/28 0700) SpO2:  [90 %-100 %] 92 % (01/28 0715) Weight:  [174 lb 13.2 oz (79.3 kg)-176 lb 5.9 oz (80 kg)] 176 lb 5.9 oz (80 kg) (01/28 0500) HEMODYNAMICS:   VENTILATOR SETTINGS:   INTAKE / OUTPUT: Intake/Output      01/27 0701 - 01/28 0700 01/28 0701 - 01/29 0700   P.O. 240    Other 30    Total  Intake(mL/kg) 270 (3.4)    Other 4000    Total Output 4000     Net -3730            PHYSICAL EXAMINATION: General:  Awake. Alert. No acute distress. Laying in bed watching TV.  Integument:  Warm & dry. No rash on exposed skin. No bruising. HEENT:  No scleral injection or icterus. PERRL. moist mucous membranes. Cardiovascular:  Regular rate. No edema. No appreciable JVD.  Pulmonary:  Good aeration & clear to auscultation bilaterally. Symmetric chest wall expansion. No accessory muscle use. Abdomen: Soft. Normal bowel sounds. Nondistended. Neurological: Oriented to person, place, time and situation. Grossly nonfocal. Cranial nerves intact.  LABS:  CBC  Recent Labs Lab 08/18/15 1548 08/19/15 0026  WBC 6.5 6.6  HGB 9.0* 8.2*  HCT 28.5* 24.7*  PLT 145* 138*   Coag's  Recent Labs Lab 08/18/15 2330  INR 1.45   BMET  Recent Labs Lab 08/18/15 1548 08/19/15 0026  NA 142 138  K >7.5* 3.9  CL 106 97*  CO2 23 26  BUN 99* 30*  CREATININE 13.71* 5.97*  GLUCOSE 152* 65   Electrolytes  Recent Labs Lab 08/18/15 1548 08/19/15 0026  CALCIUM 8.9 8.8*  MG  --  1.7  PHOS  --  2.7   Sepsis Markers No results for input(s): LATICACIDVEN, PROCALCITON, O2SATVEN in the last 168 hours. ABG No results for input(s): PHART, PCO2ART, PO2ART in the last 168 hours. Liver Enzymes  Recent Labs Lab 08/19/15  0026  ALBUMIN 2.9*   Cardiac Enzymes  Recent Labs Lab 08/19/15 0026  TROPONINI 0.05*   Glucose  Recent Labs Lab 08/18/15 1734 08/18/15 1806 08/18/15 2207 08/18/15 2354 08/19/15 0352  GLUCAP 256* 195* 73 61* 104*    Imaging Dg Chest 2 View  08/18/2015  CLINICAL DATA:  Chest pain and shortness of breath while at dialysis today. EXAM: CHEST  2 VIEW COMPARISON:  08/10/2015. FINDINGS: The cardio pericardial silhouette is enlarged. There is pulmonary vascular congestion without overt pulmonary edema. The lungs are clear wiithout focal pneumonia, pneumothorax or  pleural effusion. Right IJ dialysis catheter tip projects at the level of the mid right atrium. The visualized bony structures of the thorax are intact. IMPRESSION: Cardiomegaly with vascular congestion. Electronically Signed   By: Misty Stanley M.D.   On: 08/18/2015 15:57   Dg Chest Port 1 View  08/19/2015  CLINICAL DATA:  50 year old male with shortness of breath. End-stage renal disease. EXAM: PORTABLE CHEST 1 VIEW COMPARISON:  08/18/2015 FINDINGS: Cardiomegaly and pulmonary vascular congestion noted. Slightly increased left retrocardiac opacity may represent atelectasis. Trace bilateral pleural effusions are present. There is no evidence of pneumothorax. A right IJ central venous catheter is present with tip overlying the upper right atrium. IMPRESSION: Little significant change except for possible development of left lower lung atelectasis. Cardiomegaly, pulmonary vascular congestion and trace pleural effusions again noted. Electronically Signed   By: Margarette Canada M.D.   On: 08/19/2015 07:07    ASSESSMENT / PLAN:  50 year old male with known history of end-stage renal disease and history of noncompliance with hemodialysis. Patient reports he dialyzes normally on Tuesday but was told on Thursday to return Friday for hemodialysis. Presenting with acute hypoxic respiratory failure and hyperkalemia resulting in EKG changes with a first-degree block and peaked T waves with significant bradycardia. Patient underwent emergent hemodialysis last night with resolution of hyperkalemia and EKG findings. He reports he is compliant with his Coumadin at home and is also taking Neurontin but is unclear on his other medications.  1. Hyperkalemia: Resolved. Status post hemodialysis. 2. Acute hypoxic respiratory failure: Resolved after hemodialysis. Likely secured to pulmonary edema. 3. Sympotomatic bradycardia with peaked T waves: Secondary to hyperkalemia. Resolved. Continuing to monitor the patient on  telemetry. 4. Acute encephalopathy: In the setting of missed hemodialysis likely multifactorial. Resolved. History of narcotic abuse as well as depression. Holding home Xanax & Norco. 5. Nausea & Emesis: In the setting of hyperkalemia with missed hemodialysis. Resolved. Continuing Zofran IV when necessary. 6. ESRD: Nephrology following. Hemodialysis per nephrology recommendations. 7. H/O PE/DVT:  Supposed to be taking Coumadin as an outpatient and reports he is compliant. Continuing Coumadin per pharmacy protocol. 8. H/O Chronic systolic CHF: Continuing hemodialysis per nephrology. Continuing Lisinopril & Coreg. 9. H/O Anxiety/Depression:  Reports he takes Xanax intermittently. Holding given altered mentation. 10. H/O Neuropathy: Restarting home gabapentin. 11. H/O DM Type 2: Switching Accu-Cheks to every before meals & at bedtime. Low-dose sliding scale algorithm. 12. H/O Hyperlipidemia: Continuing on Lipitor. 44. H/O HTN:  Improving on dialysis. Continuing on amlodipine, Coreg, Imdur, and lisinopril. Monitor per unit protocol. 14. H/O GERD:  Protonix PO daily. 15. Prophylaxis: Coumadin for systemic and coagulation. Continuing Protonix by mouth daily. SCDs for DVT prophylaxis. 16. Diet: Renal hemodialysis diet to be advanced as tolerated. 17. Disposition: Transfer patient to 6 E telemetry. [facility] to assume care & PCCM to sign off 1/29.  Sonia Baller Ashok Cordia, M.D. [facility] Pulmonary & Critical Care Pager:  [phone] After  3pm or if no response, call [phone] 08/19/2015, 7:59 AM

## 2015-08-20 DIAGNOSIS — Z9119 Patient's noncompliance with other medical treatment and regimen: Secondary | ICD-10-CM

## 2015-08-20 DIAGNOSIS — F4323 Adjustment disorder with mixed anxiety and depressed mood: Secondary | ICD-10-CM

## 2015-08-20 HISTORY — DX: Adjustment disorder with mixed anxiety and depressed mood: F43.23

## 2015-08-20 LAB — CBC WITH DIFFERENTIAL/PLATELET
BASOS ABS: 0 10*3/uL (ref 0.0–0.1)
BASOS PCT: 0 %
Eosinophils Absolute: 0.4 10*3/uL (ref 0.0–0.7)
Eosinophils Relative: 7 %
HEMATOCRIT: 27.6 % — AB (ref 39.0–52.0)
Hemoglobin: 8.7 g/dL — ABNORMAL LOW (ref 13.0–17.0)
Lymphocytes Relative: 10 %
Lymphs Abs: 0.6 10*3/uL — ABNORMAL LOW (ref 0.7–4.0)
MCH: 27.9 pg (ref 26.0–34.0)
MCHC: 31.5 g/dL (ref 30.0–36.0)
MCV: 88.5 fL (ref 78.0–100.0)
MONO ABS: 0.9 10*3/uL (ref 0.1–1.0)
Monocytes Relative: 14 %
NEUTROS ABS: 4.3 10*3/uL (ref 1.7–7.7)
NEUTROS PCT: 69 %
Platelets: 164 10*3/uL (ref 150–400)
RBC: 3.12 MIL/uL — AB (ref 4.22–5.81)
RDW: 15.8 % — AB (ref 11.5–15.5)
WBC: 6.2 10*3/uL (ref 4.0–10.5)

## 2015-08-20 LAB — RENAL FUNCTION PANEL
ALBUMIN: 2.5 g/dL — AB (ref 3.5–5.0)
Anion gap: 9 (ref 5–15)
BUN: 24 mg/dL — AB (ref 6–20)
CO2: 30 mmol/L (ref 22–32)
CREATININE: 5.65 mg/dL — AB (ref 0.61–1.24)
Calcium: 8 mg/dL — ABNORMAL LOW (ref 8.9–10.3)
Chloride: 101 mmol/L (ref 101–111)
GFR calc Af Amer: 12 mL/min — ABNORMAL LOW (ref >60)
GFR, EST NON AFRICAN AMERICAN: 11 mL/min — AB (ref >60)
Glucose, Bld: 92 mg/dL (ref 65–99)
PHOSPHORUS: 3.7 mg/dL (ref 2.5–4.6)
POTASSIUM: 4.2 mmol/L (ref 3.5–5.1)
Sodium: 140 mmol/L (ref 135–145)

## 2015-08-20 LAB — GLUCOSE, CAPILLARY
GLUCOSE-CAPILLARY: 115 mg/dL — AB (ref 65–99)
Glucose-Capillary: 153 mg/dL — ABNORMAL HIGH (ref 65–99)
Glucose-Capillary: 99 mg/dL (ref 65–99)

## 2015-08-20 LAB — HEPATITIS B SURFACE ANTIGEN: HEP B S AG: NEGATIVE

## 2015-08-20 LAB — PROTIME-INR
INR: 1.73 — ABNORMAL HIGH (ref 0.00–1.49)
PROTHROMBIN TIME: 20.3 s — AB (ref 11.6–15.2)

## 2015-08-20 LAB — MAGNESIUM: MAGNESIUM: 1.7 mg/dL (ref 1.7–2.4)

## 2015-08-20 MED ORDER — SEVELAMER CARBONATE 800 MG PO TABS
1600.0000 mg | ORAL_TABLET | Freq: Three times a day (TID) | ORAL | Status: DC
  Administered 2015-08-20: 1600 mg via ORAL
  Filled 2015-08-20: qty 2

## 2015-08-20 MED ORDER — HYDROCODONE-ACETAMINOPHEN 5-325 MG PO TABS: 1.0000 | ORAL_TABLET | Freq: Two times a day (BID) | ORAL | Status: DC | PRN

## 2015-08-20 MED ORDER — WARFARIN SODIUM 6 MG PO TABS
6.0000 mg | ORAL_TABLET | Freq: Once | ORAL | Status: DC
  Filled 2015-08-20: qty 1

## 2015-08-20 NOTE — Consult Note (Signed)
Frank [name] Psychiatry Consult   Reason for Consult:  Capacity, noncompliant with dialysis Referring Physician:  Dr Frank [name] Patient Identification: Frank [name] MRN:  [id number] Principal Diagnosis: Adjustment disorder with mixed anxiety and depressed mood Diagnosis:   Patient Active Problem List   Diagnosis Date Noted  . Chronic epididymitis [N45.1] 06/19/2015  . Groin pain, chronic, right [R10.9] 06/19/2015  . Incarcerated right inguinal hernia [K40.30] 02/16/2015  . Inguinal hernia [K40.90]   . Abdominal pain in male [R10.9]   . Malnutrition of moderate degree ([place]) [E44.0] 02/03/2015  . Left upper extremity deep vein thrombosis (HCC) [I82.622] 02/01/2015  . Drug-seeking behavior [F19.10] 02/01/2015  . HTN (hypertension) [I10] 01/02/2015  . Anemia of chronic disease [D63.8]   . Dyslipidemia [E78.5]   . Volume overload [E87.70] 12/19/2014  . Chronic pulmonary embolism ([place]) [I27.82]   . DVT (deep venous thrombosis) ([place]) [I82.409]   . Accelerated hypertension [I10] 11/29/2014  . History of noncompliance with medical treatment [Z91.19]   . Hyperkalemia [E87.5] 11/17/2014  . ESRD on hemodialysis ([place]) [N18.6, Z99.2]   . Chronic combined systolic and diastolic CHF (congestive heart failure) ([place]) [I50.42]   . DM (diabetes mellitus), type 2, uncontrolled, with renal complications (HCC) [X72.62, E11.65]   . ESRD on dialysis- Tues, Thurs, Sat in [place], nephro: Frank [name] [N18.6, Z99.2] 04/13/2013    Total Time spent with patient: 45 minutes  Subjective:   Frank [name] is a 50 y.o. male patient admitted with hyperkalemia.  HPI:  Patient is 50 year old African-American, married, employed [name] who was admitted on medical floor for hyperkalemia.  Psychiatry consult was called because patient has been noncompliant with dialysis and appears danger to himself.  Patient seen chart reviewed.  Patient denies any history of psychiatric inpatient treatment and he denied any current  treatment for depression.  He admitted admitted multiple times on the medical floor due to complication .  Patient has end-stage renal disease and currently on dialysis but admitted noncompliant with dialysis because most of the time he is in the hospital.  He endorse that he is not happy with his nephrologist.  He is getting hemodialysis but he preferred to have peritoneal dialysis because he liked to work.  Patient is a Theatre manager.  He endorse multiple health issues including fluid in his heart and for that reason he had missed dialysis but he wants to follow the recommendation from his doctor.  He admitted having anxiety and felt that he takes Xanax.  Currently he has no primary care physician but he promised to find one so his health issues can be addressed sooner.  He denies any insomnia, irritability, hallucination, aggressive behavior, suicidal thoughts or homicidal thought.  [facility] he [facility] because it is close to his home.  Patient lives with his wife.  He has 2 kids who are in college.  Patient denies any illegal substance use.  Patient has history of renal transplant in the past but his kidney rejected when he was in jail for 30 days and did not receive his medication.  Patient has kidney disease for 17 years.  He denies any anhedonia, feeling of hopelessness or worthlessness.  He denies any crying spells, insomnia or any hallucination or paranoia.  Past Psychiatric History: He was briefly seen psychiatrist when he applied for disability.  Patient denies any history of psychiatric inpatient treatment or any suicidal attempt.    Risk to Self: Is patient at risk for suicide?: No  Risk to Others:   Prior Inpatient Therapy:   Prior Outpatient Therapy:    Past Medical History:  Past Medical History  Diagnosis Date  . Hypertension   . Depression   . Complication of anesthesia     itching, sore throat  . Diabetes mellitus without complication ([place])      No history per patient, but remains under history as A1c would not be accurate given on dialysis  . Shortness of breath   . Anxiety   . ESRD (end stage renal disease) ([place])     due to HTN per patient, followed at [facility], s/p failed kidney transplant - dialysis Tue, Th, Sat  . Renal insufficiency   . CHF (congestive heart failure) ([place])   . Anemia   . Dialysis patient [facility])     Past Surgical History  Procedure Laterality Date  . Kidney receipient  2006    failed and started HD in March 2014  . Capd insertion    . Capd removal    . Left heart catheterization with coronary angiogram N/A 09/02/2014    Procedure: LEFT HEART CATHETERIZATION WITH CORONARY ANGIOGRAM;  Surgeon: Frank Man, MD;  Location: [facility] CATH LAB;  Service: Cardiovascular;  Laterality: N/A;  . Inguinal hernia repair Right 02/14/2015    Procedure: REPAIR INCARCERATED RIGHT INGUINAL HERNIA;  Surgeon: [name], MD;  Location: [facility];  Service: General;  Laterality: Right;   Family History: History reviewed. No pertinent family history.   Family Psychiatric  History Social History:  patient denies any family history of psychiatric illness.  History  Alcohol Use No     History  Drug Use No    Social History   Social History  . Marital Status: Married    Spouse Name: N/A  . Number of Children: 3  . Years of Education: [employer]   Social History Main Topics  . Smoking status: Former Smoker -- 0.00 packs/day for 1 years    Types: Cigarettes  . Smokeless tobacco: Never Used     Comment: quit Jan 2014  . Alcohol Use: No  . Drug Use: No  . Sexual Activity: Not Currently   Other Topics Concern  . None   Social History Narrative   Owns own plumbing company   Additional Social History:      Allergies:   Allergies  Allergen Reactions  . Butalbital-Apap-Caffeine Shortness Of Breath and Swelling    Swelling in throat  . Ferrlecit [Na Ferric Gluc Cplx In Sucrose] Shortness Of Breath, Swelling and Other (See  Comments)    Swelling in throat  . Minoxidil Shortness Of Breath  . Darvocet [Propoxyphene N-Acetaminophen] Hives  . Propoxyphene Hives    Allergy listed on file from [facility] (Pt did not mention any allergies)     Labs:  Results for orders placed or performed during the hospital encounter of 08/18/15 (from the past 48 hour(s))  Basic metabolic panel     Status: Abnormal   Collection Time: 08/18/15  3:48 PM  Result Value Ref Range   Sodium 142 135 - 145 mmol/L   Potassium >7.5 (HH) 3.5 - 5.1 mmol/L    Comment: REPEATED TO VERIFY NO VISIBLE HEMOLYSIS CRITICAL RESULT CALLED TO, READ BACK BY AND VERIFIED WITH: H SHARP,RN 1641 08/18/2015 WBOND    Chloride 106 101 - 111 mmol/L   CO2 23 22 - 32 mmol/L   Glucose, Bld 152 (H) 65 - 99 mg/dL   BUN 99 (H) 6 - 20 mg/dL  Creatinine, Ser 13.71 (H) 0.61 - 1.24 mg/dL   Calcium 8.9 8.9 - 10.3 mg/dL   GFR calc non Af Amer 4 (L) >60 mL/min   GFR calc Af Amer 4 (L) >60 mL/min    Comment: (NOTE) The eGFR has been calculated using the CKD EPI equation. This calculation has not been validated in all clinical situations. eGFR's persistently <60 mL/min signify possible Chronic Kidney Disease.    Anion gap 13 5 - 15  CBC     Status: Abnormal   Collection Time: 08/18/15  3:48 PM  Result Value Ref Range   WBC 6.5 4.0 - 10.5 K/uL   RBC 3.18 (L) 4.22 - 5.81 MIL/uL   Hemoglobin 9.0 (L) 13.0 - 17.0 g/dL   HCT 28.5 (L) 39.0 - 52.0 %   MCV 89.6 78.0 - 100.0 fL   MCH 28.3 26.0 - 34.0 pg   MCHC 31.6 30.0 - 36.0 g/dL   RDW 15.8 (H) 11.5 - 15.5 %   Platelets 145 (L) 150 - 400 K/uL  I-stat troponin, ED (not at [facility])     Status: None   Collection Time: 08/18/15  3:55 PM  Result Value Ref Range   Troponin i, poc 0.04 0.00 - 0.08 ng/mL   Comment 3            Comment: Due to the release kinetics of cTnI, a negative result within the first hours of the onset of symptoms does not rule out myocardial infarction with certainty. If myocardial  infarction is still suspected, repeat the test at appropriate intervals.   CBG monitoring, ED     Status: Abnormal   Collection Time: 08/18/15  5:34 PM  Result Value Ref Range   Glucose-Capillary 256 (H) 65 - 99 mg/dL  MRSA PCR Screening     Status: Abnormal   Collection Time: 08/18/15  6:00 PM  Result Value Ref Range   MRSA by PCR INVALID RESULTS, SPECIMEN SENT FOR CULTURE (A) NEGATIVE    Comment:        The GeneXpert MRSA Assay (FDA approved for NASAL specimens only), is one component of a comprehensive MRSA colonization surveillance program. It is not intended to diagnose MRSA infection nor to guide or monitor treatment for MRSA infections. RESULT CALLED TO, READ BACK BY AND VERIFIED WITH: MWHITE _0  08/18/15 MKELLY   MRSA culture     Status: None (Preliminary result)   Collection Time: 08/18/15  6:00 PM  Result Value Ref Range   Specimen Description NASAL SWAB    Special Requests NONE    Culture      NORMAL NASOPHARYNGEAL FLORA Performed at [facility]    Report Status PENDING   Glucose, capillary     Status: Abnormal   Collection Time: 08/18/15  6:06 PM  Result Value Ref Range   Glucose-Capillary 195 (H) 65 - 99 mg/dL  Glucose, capillary     Status: None   Collection Time: 08/18/15 10:07 PM  Result Value Ref Range   Glucose-Capillary 73 65 - 99 mg/dL  Protime-INR     Status: Abnormal   Collection Time: 08/18/15 11:30 PM  Result Value Ref Range   Prothrombin Time 17.8 (H) 11.6 - 15.2 seconds   INR 1.45 0.00 - 1.49  Glucose, capillary     Status: Abnormal   Collection Time: 08/18/15 11:54 PM  Result Value Ref Range   Glucose-Capillary 61 (L) 65 - 99 mg/dL  CBC     Status: Abnormal   Collection  Time: 08/19/15 12:26 AM  Result Value Ref Range   WBC 6.6 4.0 - 10.5 K/uL   RBC 2.83 (L) 4.22 - 5.81 MIL/uL   Hemoglobin 8.2 (L) 13.0 - 17.0 g/dL   HCT 24.7 (L) 39.0 - 52.0 %   MCV 87.3 78.0 - 100.0 fL   MCH 29.0 26.0 - 34.0 pg   MCHC 33.2 30.0 - 36.0  g/dL   RDW 15.5 11.5 - 15.5 %   Platelets 138 (L) 150 - 400 K/uL  Magnesium     Status: None   Collection Time: 08/19/15 12:26 AM  Result Value Ref Range   Magnesium 1.7 1.7 - 2.4 mg/dL  Renal function panel     Status: Abnormal   Collection Time: 08/19/15 12:26 AM  Result Value Ref Range   Sodium 138 135 - 145 mmol/L   Potassium 3.9 3.5 - 5.1 mmol/L    Comment: DELTA CHECK NOTED   Chloride 97 (L) 101 - 111 mmol/L   CO2 26 22 - 32 mmol/L   Glucose, Bld 65 65 - 99 mg/dL   BUN 30 (H) 6 - 20 mg/dL   Creatinine, Ser 5.97 (H) 0.61 - 1.24 mg/dL    Comment: DELTA CHECK NOTED   Calcium 8.8 (L) 8.9 - 10.3 mg/dL   Phosphorus 2.7 2.5 - 4.6 mg/dL   Albumin 2.9 (L) 3.5 - 5.0 g/dL   GFR calc non Af Amer 10 (L) >60 mL/min   GFR calc Af Amer 11 (L) >60 mL/min    Comment: (NOTE) The eGFR has been calculated using the CKD EPI equation. This calculation has not been validated in all clinical situations. eGFR's persistently <60 mL/min signify possible Chronic Kidney Disease.    Anion gap 15 5 - 15  Troponin I     Status: Abnormal   Collection Time: 08/19/15 12:26 AM  Result Value Ref Range   Troponin I 0.05 (H) <0.031 ng/mL    Comment:        PERSISTENTLY INCREASED TROPONIN VALUES IN THE RANGE OF 0.04-0.49 ng/mL CAN BE SEEN IN:       -UNSTABLE ANGINA       -CONGESTIVE HEART FAILURE       -MYOCARDITIS       -CHEST TRAUMA       -ARRYHTHMIAS       -LATE PRESENTING MYOCARDIAL INFARCTION       -COPD   CLINICAL FOLLOW-UP RECOMMENDED.   Hepatitis B surface antigen     Status: None   Collection Time: 08/19/15  1:41 AM  Result Value Ref Range   Hepatitis B Surface Ag Negative Negative    Comment: (NOTE) Performed At: [facility] [place], [place] [id number] Lindon Romp MD HU:[id number]   Glucose, capillary     Status: Abnormal   Collection Time: 08/19/15  3:52 AM  Result Value Ref Range   Glucose-Capillary 104 (H) 65 - 99 mg/dL  Glucose, capillary      Status: None   Collection Time: 08/19/15  8:22 AM  Result Value Ref Range   Glucose-Capillary 87 65 - 99 mg/dL  Basic metabolic panel     Status: Abnormal   Collection Time: 08/19/15  9:25 AM  Result Value Ref Range   Sodium 137 135 - 145 mmol/L   Potassium 5.3 (H) 3.5 - 5.1 mmol/L   Chloride 97 (L) 101 - 111 mmol/L   CO2 26 22 - 32 mmol/L   Glucose, Bld 135 (H) 65 - 99 mg/dL  BUN 34 (H) 6 - 20 mg/dL   Creatinine, Ser 7.73 (H) 0.61 - 1.24 mg/dL   Calcium 8.4 (L) 8.9 - 10.3 mg/dL   GFR calc non Af Amer 7 (L) >60 mL/min   GFR calc Af Amer 8 (L) >60 mL/min    Comment: (NOTE) The eGFR has been calculated using the CKD EPI equation. This calculation has not been validated in all clinical situations. eGFR's persistently <60 mL/min signify possible Chronic Kidney Disease.    Anion gap 14 5 - 15  Glucose, capillary     Status: Abnormal   Collection Time: 08/19/15 12:00 PM  Result Value Ref Range   Glucose-Capillary 128 (H) 65 - 99 mg/dL   Comment 1 Notify RN    Comment 2 Document in Chart   Glucose, capillary     Status: Abnormal   Collection Time: 08/19/15  8:18 PM  Result Value Ref Range   Glucose-Capillary 170 (H) 65 - 99 mg/dL  Protime-INR     Status: Abnormal   Collection Time: 08/20/15  5:55 AM  Result Value Ref Range   Prothrombin Time 20.3 (H) 11.6 - 15.2 seconds   INR 1.73 (H) 0.00 - 1.49  Magnesium     Status: None   Collection Time: 08/20/15  5:55 AM  Result Value Ref Range   Magnesium 1.7 1.7 - 2.4 mg/dL  CBC with Differential/Platelet     Status: Abnormal   Collection Time: 08/20/15  5:55 AM  Result Value Ref Range   WBC 6.2 4.0 - 10.5 K/uL   RBC 3.12 (L) 4.22 - 5.81 MIL/uL   Hemoglobin 8.7 (L) 13.0 - 17.0 g/dL   HCT 27.6 (L) 39.0 - 52.0 %   MCV 88.5 78.0 - 100.0 fL   MCH 27.9 26.0 - 34.0 pg   MCHC 31.5 30.0 - 36.0 g/dL   RDW 15.8 (H) 11.5 - 15.5 %   Platelets 164 150 - 400 K/uL   Neutrophils Relative % 69 %   Neutro Abs 4.3 1.7 - 7.7 K/uL   Lymphocytes  Relative 10 %   Lymphs Abs 0.6 (L) 0.7 - 4.0 K/uL   Monocytes Relative 14 %   Monocytes Absolute 0.9 0.1 - 1.0 K/uL   Eosinophils Relative 7 %   Eosinophils Absolute 0.4 0.0 - 0.7 K/uL   Basophils Relative 0 %   Basophils Absolute 0.0 0.0 - 0.1 K/uL  Renal function panel     Status: Abnormal   Collection Time: 08/20/15  5:55 AM  Result Value Ref Range   Sodium 140 135 - 145 mmol/L   Potassium 4.2 3.5 - 5.1 mmol/L    Comment: DELTA CHECK NOTED   Chloride 101 101 - 111 mmol/L   CO2 30 22 - 32 mmol/L   Glucose, Bld 92 65 - 99 mg/dL   BUN 24 (H) 6 - 20 mg/dL   Creatinine, Ser 5.65 (H) 0.61 - 1.24 mg/dL   Calcium 8.0 (L) 8.9 - 10.3 mg/dL   Phosphorus 3.7 2.5 - 4.6 mg/dL   Albumin 2.5 (L) 3.5 - 5.0 g/dL   GFR calc non Af Amer 11 (L) >60 mL/min   GFR calc Af Amer 12 (L) >60 mL/min    Comment: (NOTE) The eGFR has been calculated using the CKD EPI equation. This calculation has not been validated in all clinical situations. eGFR's persistently <60 mL/min signify possible Chronic Kidney Disease.    Anion gap 9 5 - 15  Glucose, capillary     Status: Abnormal  Collection Time: 08/20/15  8:03 AM  Result Value Ref Range   Glucose-Capillary 115 (H) 65 - 99 mg/dL  Glucose, capillary     Status: Abnormal   Collection Time: 08/20/15 12:37 PM  Result Value Ref Range   Glucose-Capillary 153 (H) 65 - 99 mg/dL    Current Facility-Administered Medications  Medication Dose Route Frequency Provider Last Rate Last Dose  . 0.9 %  sodium chloride infusion  100 mL Intravenous PRN Donato Heinz, MD      . 0.9 %  sodium chloride infusion  100 mL Intravenous PRN Donato Heinz, MD      . 0.9 %  sodium chloride infusion  250 mL Intravenous PRN [name], MD      . acetaminophen (TYLENOL) tablet 650 mg  650 mg Oral Q6H PRN Javier Glazier, MD   650 mg at 08/19/15 1209  . alteplase (CATHFLO ACTIVASE) injection 2 mg  2 mg Intracatheter Once PRN Donato Heinz, MD      . amLODipine  (NORVASC) tablet 10 mg  10 mg Oral Daily Corky Sox, MD   10 mg at 08/20/15 1023  . atorvastatin (LIPITOR) tablet 40 mg  40 mg Oral q1800 Javier Glazier, MD   40 mg at 08/19/15 2249  . calcium gluconate inj 10% (1 g) URGENT USE ONLY!  1 g Intravenous Once Sharlett Iles, MD   1 g at 08/18/15 1714  . carvedilol (COREG) tablet 25 mg  25 mg Oral BID WC Corky Sox, MD   25 mg at 08/20/15 1022  . gabapentin (NEURONTIN) capsule 100 mg  100 mg Oral TID Javier Glazier, MD   100 mg at 08/20/15 1023  . heparin injection 1,000 Units  1,000 Units Dialysis PRN Donato Heinz, MD      . heparin injection 1,600 Units  20 Units/kg Dialysis PRN Donato Heinz, MD      . hydrALAZINE (APRESOLINE) tablet 50 mg  50 mg Oral 3 times per day Corky Sox, MD   50 mg at 08/20/15 1256  . ibuprofen (ADVIL,MOTRIN) tablet 400 mg  400 mg Oral Q6H PRN Anders Simmonds, MD      . insulin aspart (novoLOG) injection 0-5 Units  0-5 Units Subcutaneous QHS Javier Glazier, MD      . insulin aspart (novoLOG) injection 0-9 Units  0-9 Units Subcutaneous TID WC Javier Glazier, MD   2 Units at 08/20/15 1255  . isosorbide mononitrate (IMDUR) 24 hr tablet 60 mg  60 mg Oral Daily Corky Sox, MD   60 mg at 08/20/15 1022  . lidocaine (PF) (XYLOCAINE) 1 % injection 5 mL  5 mL Intradermal PRN Donato Heinz, MD      . lidocaine-prilocaine (EMLA) cream 1 application  1 application Topical PRN Donato Heinz, MD      . lisinopril (PRINIVIL,ZESTRIL) tablet 20 mg  20 mg Oral Daily Corky Sox, MD   20 mg at 08/20/15 1023  . ondansetron (ZOFRAN) injection 4 mg  4 mg Intravenous Q6H PRN Marjan Rabbani, MD      . pantoprazole (PROTONIX) EC tablet 40 mg  40 mg Oral Daily Corky Sox, MD   40 mg at 08/20/15 1023  . pentafluoroprop-tetrafluoroeth (GEBAUERS) aerosol 1 application  1 application Topical PRN Donato Heinz, MD      . predniSONE (DELTASONE) tablet 5 mg  5 mg Oral Q breakfast Javier Glazier, MD   5 mg  at 08/20/15 1023  .  sevelamer carbonate (RENVELA) tablet 1,600 mg  1,600 mg Oral TID WC Javier Glazier, MD   1,600 mg at 08/20/15 1414  . warfarin (COUMADIN) tablet 6 mg  6 mg Oral ONCE-1800 Roma Schanz, [facility]      . Warfarin - Pharmacist Dosing Inpatient   Does not apply [id number] Brand Males, MD        Musculoskeletal: Strength & Muscle Tone: within normal limits Gait & Station: normal Patient leans: N/A  Psychiatric Specialty Exam: ROS  Blood pressure 112/97, pulse 79, temperature 98 F (36.7 C), temperature source Oral, resp. rate 18, height _0  (1.88 m), weight 78.9 kg (173 lb 15.1 oz), SpO2 99 %.Body mass index is 22.32 kg/(m^2).  General Appearance: Casual and Fairly Groomed  Engineer, water::  Good  Speech:  Normal Rate  Volume:  Normal  Mood:  Anxious  Affect:  Appropriate  Thought Process:  Coherent  Orientation:  Full (Time, Place, and Person)  Thought Content:  Rumination  Suicidal Thoughts:  No  Homicidal Thoughts:  No  Memory:  Immediate;   Fair Recent;   Fair Remote;   Good  Judgement:  Fair  Insight:  Fair  Psychomotor Activity:  Normal  Concentration:  Fair  Recall:  [place] of Knowledge:Fair  Language: Good  Akathisia:  No  Handed:  Right  AIMS (if indicated):     Assets:  Communication Skills Desire for Improvement Housing Social Support  ADL's:  Intact  Cognition: WNL  Sleep:      Treatment Plan Summary: Plan Patient does not require inpatient psychiatric services. Patient promised that he will establish care with his primary care physician and agreed to continue dialysis in the future.  Disposition: No evidence of imminent risk to self or others at present.   Patient does not meet criteria for psychiatric inpatient admission. Supportive therapy provided about ongoing stressors. Discussed crisis plan, support from social network, calling 911, coming to the Emergency Department, and calling Suicide Hotline.  ARFEEN,SYED T. 08/20/2015  2:44 PM

## 2015-08-20 NOTE — Progress Notes (Signed)
Patient came up to me and told me he was going to walk to the secretary's desk to ask for a [place] sandwich. Secretary told and showed the patient that there were none on the floor so the patient walked off the unit without notifying his nurse to look for a [place] sandwich. patient walked to various units within the hospital then returned to [facility] and is now back in his room. Secretary notifies not to let him off the unit by himself.

## 2015-08-20 NOTE — Progress Notes (Signed)
Patient ID: Frank [name], male   DOB: June 17, 1966, 50 y.o.   MRN: [id number]   [facility] Progress Note    Subjective:   Feels much better and wants to go home.  He now understands that he is scheduled for HD every Monday, Tuesday, Thursday, and Saturday at [facility] in [facility] and stated that he would go tomorrow.  He denies any suicidal ideation and understands the need for HD.   Objective:   BP 174/98 mmHg  Pulse 75  Temp(Src) 97.9 F (36.6 C) (Oral)  Resp 22  Ht _0  (1.88 m)  Wt 78.9 kg (173 lb 15.1 oz)  BMI 22.32 kg/m2  SpO2 97%  Intake/Output: I/O last 3 completed shifts: In: 900 [P.O.:900] Out: 7000 [Other:7000]   Intake/Output this shift:    Weight change: -1.3 kg (-2 lb 13.9 oz)  Physical Exam: Gen:WD WN AAM in NAd CVS: no rub Resp:cta Abd:+BS, soft, mildly tender [id number] pretibial edema  Labs: BMET  Recent Labs Lab 08/18/15 1548 08/19/15 0026 08/19/15 0925 08/20/15 0555  NA 142 138 137 140  K >7.5* 3.9 5.3* 4.2  CL 106 97* 97* 101  CO2 _1 GLUCOSE 152* 65 135* 92  BUN 99* 30* 34* 24*  CREATININE 13.71* 5.97* 7.73* 5.65*  ALBUMIN  --  2.9*  --  2.5*  CALCIUM 8.9 8.8* 8.4* 8.0*  PHOS  --  2.7  --  3.7   CBC  Recent Labs Lab 08/18/15 1548 08/19/15 0026 08/20/15 0555  WBC 6.5 6.6 6.2  NEUTROABS  --   --  4.3  HGB 9.0* 8.2* 8.7*  HCT 28.5* 24.7* 27.6*  MCV 89.6 87.3 88.5  PLT 145* 138* 164    _2 @ Medications:    . amLODipine  10 mg Oral Daily  . atorvastatin  40 mg Oral q1800  . calcium gluconate  1 g Intravenous Once  . carvedilol  25 mg Oral BID WC  . gabapentin  100 mg Oral TID  . hydrALAZINE  50 mg Oral 3 times per day  . insulin aspart  0-5 Units Subcutaneous QHS  . insulin aspart  0-9 Units Subcutaneous TID WC  . isosorbide mononitrate  60 mg Oral Daily  . lisinopril  20 mg Oral Daily  . pantoprazole  40 mg Oral Daily  . predniSONE  5 mg Oral Q breakfast  . warfarin  6  mg Oral ONCE-1800  . Warfarin - Pharmacist Dosing Inpatient   Does not apply q1800   Dialysis Orders: Center: [facility] on [facility]. 3 hours, bfr350, dfr 800, 2K 2.5Ca, edw 161  Assessment/ Plan:   1. Hyperkalemia- life-threatening with peaked T waves, bradycardia, and 1st degree AV block due to patient's noncompliance. Improved after emergent HD with low K bath. 2. Noncardiogenic pulmonary edema- related to nonadherence with HD prescription and schedule  3. Bradycardia- now with 1st degree AV block, likely related to severe hyperkalemia. Treated with IV Insulin/D50 x 2, external pacemaker, IV calcium chloride and plan for emergent HD and follow in the ICU 1. Currently in NSR and again stressed the importance of compliance with HD to prevent this from recurring. 4. ESRD - Chronically noncompliant with hemodialysis. Plan for HD again today for volume removal (short treatment) He is supposed to be on 4 treatments per week of 3 hours but does not adhere to this and is in and out of different hospitals.  Today he states that he will go to  HD tomorrow and follow his outpatient schedule/regimen. 5. Hypertension/volume - improved after UF with HD 6. Anemia - will follow and no ESA for now due to concern over 2.5 cm renal mass 7. Metabolic bone disease - Continue with outpatient meds and follow Calcium and phosphorus. 8. Nutrition - per primary 9. Narcotics abuse- ongoing issue 10. Vascular access- has RIJ TDC. 11. Renal Mass- 2.5cm and no ESA due to concern over malignancy 12. Disposition- pt has been repeatedly noncompliant and goes from hospital to hospital and blatantly lies about treatments. He stated his dialysis unit told him to come on Friday, but in fact he was admitted to [facility] and discharged on the 24th and never showed up for dialysis on the 26-Sep-2022 th. He almost died, again. Patient declines evaluation by psychiatry as he wants to leave today.  He denies suicidal ideation and  expresses understanding for the need of compliance with hemodialysis.  Unfortunately he blames [facility] for not letting him resume peritoneal dialysis for his hospitalizations and states that he gets dialysis but not at his home unit regularly.  He is stable to discharge as he is not suicidal but again stressed the importance of 4 HD sessions per week.  He agreed to go to [facility] unit tomorrow.  Pt is stable for discharge and will defer Psych eval.  Donetta Potts, MD [place] Pager [phone] 08/20/2015, 9:26 AM

## 2015-08-20 NOTE — Progress Notes (Signed)
PULMONARY / CRITICAL CARE MEDICINE   Name: Frank [name] MRN: [id number] DOB: 1966/02/13    ADMISSION DATE:  08/18/2015 CONSULTATION DATE:  08/18/15  REFERRING MD :  EDP PRIMARY SERVICE: PCCM  CHIEF COMPLAINT:  Hyperkalemia   BRIEF PATIENT DESCRIPTION:  50 yo M with ESRD noncompliant with HD who presented from his outpatient HD center in [facility] with dyspnea and substernal chest pain found to have hyperkalemia with K of >7.5 with peaked t-waves, pulmonary edema, and new 1st heart block requiring emergent dialysis.     SIGNIFICANT EVENTS:  1/27 - Admitted with hyperkalemia requiring emergent HD  STUDIES: Port CXR 1/27: cardiomegaly and vascular congestion EKG 1/27: marked sinus bradycardia with HR 36 and marked 1st degree heart block. Noted to be in 20's per ED nursing note.  EKG 1/28: Normal sinus rhythm. LAE. QTc 439m. Port CXR 1/28: Personally reviewed by me. Suggestion of cardiomegaly. R IJ catheter in good position. No significant change.  LINES / TUBES: Permanent right HD double lumen catheter PIV x1  CULTURES: MRSA PCR 1/27:  Pending  ANTIBIOTICS: None  SUBJECTIVE: Nausea or vomiting. Status post hemodialysis. Minimal residual right sided abdominal discomfort. No new chest pain or pressure. Tolerating diet.  REVIEW OF SYSTEMS:  No coughing or dyspnea. No fever or chills.  VITAL SIGNS: Temp:  [97.6 F (36.4 C)-98.5 F (36.9 C)] 98 F (36.7 C) (01/29 0900) Pulse Rate:  [67-79] 79 (01/29 0900) Resp:  [18-22] 18 (01/29 0900) BP: (112-174)/(84-109) 112/97 mmHg (01/29 0900) SpO2:  [97 %-99 %] 99 % (01/29 0900) Weight:  [173 lb 15.1 oz (78.9 kg)] 173 lb 15.1 oz (78.9 kg) (01/28 1600) HEMODYNAMICS:   VENTILATOR SETTINGS:   INTAKE / OUTPUT: Intake/Output      01/28 0701 - 01/29 0700 01/29 0701 - 01/30 0700   P.O. 420 240   Other     Total Intake(mL/kg) 420 (5.3) 240 (3)   Urine (mL/kg/hr) 0 (0)    Other 3000 (1.6)    Stool 0 (0)    Total Output 3000     Net  -2580 +240          PHYSICAL EXAMINATION: General:  Awake. Alert. Laying on left side in bed.  Integument:  Warm & dry. No rash on exposed skin. No bruising. HEENT:  No scleral injection or icterus. PERRL. moist mucous membranes. Cardiovascular:  Regular rate. No  appreciable edema. No appreciable JVD.  Pulmclear bilaterally to auscultation. Normal work of breathing on room air Abdomen: Soft. Normal bowel sounds. Nondistended. Neurological: Oriented to person, place, time and situation. Grossly nonfocal. Cranial nerves intact.  LABS:  CBC  Recent Labs Lab 08/18/15 1548 08/19/15 0026 08/20/15 0555  WBC 6.5 6.6 6.2  HGB 9.0* 8.2* 8.7*  HCT 28.5* 24.7* 27.6*  PLT 145* 138* 164   Coag's  Recent Labs Lab 08/18/15 2330 08/20/15 0555  INR 1.45 1.73*   BMET  Recent Labs Lab 08/19/15 0026 08/19/15 0925 08/20/15 0555  NA 138 137 140  K 3.9 5.3* 4.2  CL 97* 97* 101  CO2 _0 BUN 30* 34* 24*  CREATININE 5.97* 7.73* 5.65*  GLUCOSE 65 135* 92   Electrolytes  Recent Labs Lab 08/19/15 0026 08/19/15 0925 08/20/15 0555  CALCIUM 8.8* 8.4* 8.0*  MG 1.7  --  1.7  PHOS 2.7  --  3.7   Sepsis Markers No results for input(s): LATICACIDVEN, PROCALCITON, O2SATVEN in the last 168 hours. ABG No results for input(s): PHART, PCO2ART,  PO2ART in the last 168 hours. Liver Enzymes  Recent Labs Lab 08/19/15 0026 08/20/15 0555  ALBUMIN 2.9* 2.5*   Cardiac Enzymes  Recent Labs Lab 08/19/15 0026  TROPONINI 0.05*   Glucose  Recent Labs Lab 08/19/15 0352 08/19/15 0822 08/19/15 1200 08/19/15 2018 08/20/15 0803 08/20/15 1237  GLUCAP 104* 87 128* 170* 115* 153*    Imaging Dg Chest 2 View  08/18/2015  CLINICAL DATA:  Chest pain and shortness of breath while at dialysis today. EXAM: CHEST  2 VIEW COMPARISON:  08/10/2015. FINDINGS: The cardio pericardial silhouette is enlarged. There is pulmonary vascular congestion without overt pulmonary edema. The lungs are  clear wiithout focal pneumonia, pneumothorax or pleural effusion. Right IJ dialysis catheter tip projects at the level of the mid right atrium. The visualized bony structures of the thorax are intact. IMPRESSION: Cardiomegaly with vascular congestion. Electronically Signed   By: Misty Stanley M.D.   On: 08/18/2015 15:57   Dg Chest Port 1 View  08/19/2015  CLINICAL DATA:  50 year old male with shortness of breath. End-stage renal disease. EXAM: PORTABLE CHEST 1 VIEW COMPARISON:  08/18/2015 FINDINGS: Cardiomegaly and pulmonary vascular congestion noted. Slightly increased left retrocardiac opacity may represent atelectasis. Trace bilateral pleural effusions are present. There is no evidence of pneumothorax. A right IJ central venous catheter is present with tip overlying the upper right atrium. IMPRESSION: Little significant change except for possible development of left lower lung atelectasis. Cardiomegaly, pulmonary vascular congestion and trace pleural effusions again noted. Electronically Signed   By: Margarette Canada M.D.   On: 08/19/2015 07:07    ASSESSMENT / PLAN:  50 year old male with known history of end-stage renal disease and history of noncompliance with hemodialysis. Patient reports he dialyzes normally on Tuesday but was told on Thursday to return Friday for hemodialysis. Presenting with acute hypoxic respiratory failure and hyperkalemia resulting in EKG changes with a first-degree block and peaked T waves with significant bradycardia. Patient underwent emergent hemodialysis last night with resolution of hyperkalemia and EKG findings. Patient has had significant improvement in all of his symptoms postdialysis. Blood pressure has stabilized significantly postdialysis. Patient again denies any suicidal ideation or desire to die. He understands he must be compliant with his hemodialysis to continue living.  1. Hyperkalemia: Resolved. Status post hemodialysis. 2. Acute hypoxic respiratory failure:  Resolved after hemodialysis. Likely secured to pulmonary edema. 3. Sympotomatic bradycardia with peaked T waves: Secondary to hyperkalemia. Resolved.  4. Acute encephalopathy: In the setting of missed hemodialysis likely multifactorial. Resolved. . 5. Nausea & Emesis: In the setting of hyperkalemia with missed hemodialysis. Resolved.  6. ESRD: Nephrology following. Hemodialysis per nephrology recommendations. 7. H/O PE/DVT:  Supposed to be taking Coumadin as an outpatient and reports he is compliant. Continuing Coumadin. 8. H/O Chronic systolic CHF: Continuing hemodialysis per nephrology. Continuing Lisinopril & Coreg. 9. H/O Anxiety/Depression:  Reports he takes Xanax intermittently.  10. H/O Neuropathy: Continue home Neurontin. 11. H/O DM Type 2: BG controlled. Accu-Cheks to every before meals & at bedtime. Low-dose sliding scale algorithm. 12. H/O Hyperlipidemia: Continuedn Lipitor. 13. H/O HTN:  Controlled with amlodipine, Coreg, Imdur, and lisinopril. Monitor per unit protocol. 14. H/O GERD:  Protonix PO daily. 15. Prophylaxis: Coumadin for systemic and coagulation. Continuing Protonix by mouth daily. SCDs for DVT prophylaxis. 16. Diet: Renal hemodialysis diet to be advanced as tolerated. 17. Disposition: Discharge patient today cleared with Nephrology.  Sonia Baller Ashok Cordia, M.D. [facility] Pager:  [phone] After 3pm or if no response,  call [phone] 08/20/2015, 3:12 PM

## 2015-08-20 NOTE — Progress Notes (Signed)
Frank [name] to be D/C'd Home per MD order.  Discussed prescriptions and follow up appointments with the patient. Prescriptions given to patient, medication list explained in detail. Pt verbalized understanding.    Medication List    STOP taking these medications        cloNIDine 0.1 mg/24hr patch  Commonly known as:  CATAPRES - Dosed in mg/24 hr     doxazosin 2 MG tablet  Commonly known as:  CARDURA     methocarbamol 500 MG tablet  Commonly known as:  ROBAXIN     ondansetron 4 MG disintegrating tablet  Commonly known as:  ZOFRAN-ODT      TAKE these medications        ALPRAZolam 0.5 MG tablet  Commonly known as:  XANAX  Take 0.5 mg by mouth 2 (two) times daily as needed for anxiety.     amLODipine 10 MG tablet  Commonly known as:  NORVASC  Take 10 mg by mouth daily.     atorvastatin 40 MG tablet  Commonly known as:  LIPITOR  Take 1 tablet (40 mg total) by mouth daily at 6 PM.     carvedilol 25 MG tablet  Commonly known as:  COREG  Take 1 tablet (25 mg total) by mouth 2 (two) times daily with a meal.     cinacalcet 30 MG tablet  Commonly known as:  SENSIPAR  Take 30 mg by mouth daily.     docusate sodium 100 MG capsule  Commonly known as:  COLACE  Take 1 capsule (100 mg total) by mouth 2 (two) times daily.     doxercalciferol 4 MCG/2ML injection  Commonly known as:  HECTOROL  Inject 1.25 mLs (2.5 mcg total) into the vein Every Tuesday,Thursday,and Saturday with dialysis.     gabapentin 100 MG capsule  Commonly known as:  NEURONTIN  Take 100 mg by mouth 3 (three) times daily.     hydrALAZINE 50 MG tablet  Commonly known as:  APRESOLINE  Take 1 tablet (50 mg total) by mouth every 8 (eight) hours.     hydrocerin Crea  Apply 1 application topically 2 (two) times daily.     HYDROcodone-acetaminophen 5-325 MG tablet  Commonly known as:  NORCO/VICODIN  Take 1 tablet by mouth 2 (two) times daily as needed for moderate pain or severe pain.     isosorbide  mononitrate 60 MG 24 hr tablet  Commonly known as:  IMDUR  Take 60 mg by mouth daily.     levalbuterol 45 MCG/ACT inhaler  Commonly known as:  XOPENEX HFA  Inhale 2 puffs into the lungs every 6 (six) hours as needed for wheezing or shortness of breath.     lisinopril 20 MG tablet  Commonly known as:  PRINIVIL,ZESTRIL  Take 1 tablet (20 mg total) by mouth daily.     multivitamin Tabs tablet  Take 1 tablet by mouth at bedtime.     omeprazole 20 MG capsule  Commonly known as:  PRILOSEC  Take 20 mg by mouth daily.     predniSONE 5 MG tablet  Commonly known as:  DELTASONE  Take 5 mg by mouth daily with breakfast.     sevelamer carbonate 800 MG tablet  Commonly known as:  RENVELA  Take 800-1,600 mg by mouth 3 (three) times daily with meals. 2 tabs three times daily with meals, and 1 tablet with snacks     warfarin 6 MG tablet  Commonly known as:  COUMADIN  Take 1  tablet (6 mg total) by mouth daily at 6 PM. Take daily until adjusted per MD        Filed Vitals:   08/20/15 0440 08/20/15 0900  BP: 174/98 112/97  Pulse: 75 79  Temp: 97.9 F (36.6 C) 98 F (36.7 C)  Resp: 22 18    Skin clean, dry and intact without evidence of skin break down, no evidence of skin tears noted. IV catheter discontinued intact. Site without signs and symptoms of complications. Dressing and pressure applied. Pt denies pain at this time. No complaints noted.  An After Visit Summary was printed and given to the patient. Patient escorted via [place], and D/C home via private auto.  Retta Mac BSN, RN

## 2015-08-20 NOTE — Progress Notes (Signed)
Pt's linen was wet from sweat. i offered to change and pt refused

## 2015-08-20 NOTE — Discharge Summary (Signed)
Physician Discharge Summary  Patient ID: Frank [name] MRN: [id number] DOB/AGE: [birth year] 50 y.o.  Admit date: 08/18/2015 Discharge date: 08/20/2015    Discharge Diagnoses:  Active Problems:   Chronic combined systolic and diastolic CHF (congestive heart failure) (HCC)   ESRD on hemodialysis (HCC)   Hyperkalemia   History of noncompliance with medical treatment                                                       D/c plan by Discharge Diagnosis  Hyperkalemia - resolved post HD  Sympotomatic bradycardia with peaked T waves PLAN -  CONSISTENT dialysis 4x/week per renal recs - pt agrees to this  F/u chem   Acute hypoxic respiratory failure - resolved.  Pulmonary edema - resolved  PLAN -  Consistent HD  Renal f/u    ESRD  S/p failed renal transplant PLAN -  HD 4x/week  Cont low dose maintenance prednisone   H/O Chronic systolic CHF AFib  PLAN -  Continuing hemodialysis per nephrology Continue on amlodipine, Coreg, Imdur, and lisinopril Will hold off on resuming clonidine, cardura  Continue coumadin  F/u INR with HD 1/30   Brief Summary: Frank [name] is a 50 y/o y/o male with a PMH of ESRD s/p failed kidney transplant who is frequently non compliant with HD, HFrEF, hx PE/DVT on coumadin, DM who presented 1/27 from his outpt HD center in [place] after being unable to complete HD r/t dyspnea, substernal chest pain, nausea, and weakness.  In ER was found to have life threatening yperkalemia with K >7.5, Mobitz II second degree Heart block vs 1st degree AV block with sinus pauses as well as pulmonary edema from volume overload. In the ED he received continuous albuterol nebulizer, IV insulin/glucose, IV calcium and was ultimately taken for emergent HD.  He improved quickly after emergent HD with resolution of hyperkalemia and EKG changes.  He was tx to floor 1/28.  He was seen again 1/29 by renal who cleared him for d/c home.  He now understands that he is scheduled for HD every  Monday, Tuesday, Thursday, and Saturday at [facility] in [facility] and stated that he would go tomorrow. He denies any suicidal ideation and understands the need for HD 4x/week. Pt states that he would ultimately like to tx his care back to [place] and would ideally prefer peritoneal dialysis which he has done in the past.     Consults: Renal - [place]   LINES / TUBES: Permanent right HD double lumen catheter PIV x1  CULTURES: MRSA PCR 1/27: Pending  ANTIBIOTICS: None  STUDIES: Port CXR 1/27: cardiomegaly and vascular congestion EKG 1/27: marked sinus bradycardia with HR 36 and marked 1st degree heart block. Noted to be in 20's per ED nursing note.  EKG 1/28: Normal sinus rhythm. LAE. QTc 46m. Port CXR 1/28:  Suggestion of cardiomegaly. R IJ catheter in good position. No significant change.    Filed Vitals:   08/19/15 1900 08/19/15 2009 08/20/15 0440 08/20/15 0900  BP: 165/101 169/96 174/98 112/97  Pulse: 78 75 75 79  Temp:  98.5 F (36.9 C) 97.9 F (36.6 C) 98 F (36.7 C)  TempSrc: Oral   Oral  Resp: _0 Height:      Weight:  SpO2:  97% 97% 99%     Discharge Labs  BMET  Recent Labs Lab 08/18/15 1548 08/19/15 0026 08/19/15 0925 08/20/15 0555  NA 142 138 137 140  K >7.5* 3.9 5.3* 4.2  CL 106 97* 97* 101  CO2 _0 GLUCOSE 152* 65 135* 92  BUN 99* 30* 34* 24*  CREATININE 13.71* 5.97* 7.73* 5.65*  CALCIUM 8.9 8.8* 8.4* 8.0*  MG  --  1.7  --  1.7  PHOS  --  2.7  --  3.7     CBC   Recent Labs Lab 08/18/15 1548 08/19/15 0026 08/20/15 0555  HGB 9.0* 8.2* 8.7*  HCT 28.5* 24.7* 27.6*  WBC 6.5 6.6 6.2  PLT 145* 138* 164   Anti-Coagulation  Recent Labs Lab 08/18/15 2330 08/20/15 0555  INR 1.45 1.73*             Follow-up Information    Follow up with Donetta Potts, MD.   Specialty:  Nephrology   Why:  Dialysis in [facility] 1/30!!   Contact information:   [address] [place]  [place] [address] [phone]          Medication List    STOP taking these medications        cloNIDine 0.1 mg/24hr patch  Commonly known as:  CATAPRES - Dosed in mg/24 hr     doxazosin 2 MG tablet  Commonly known as:  CARDURA     HYDROcodone-acetaminophen 5-325 MG tablet  Commonly known as:  NORCO/VICODIN     methocarbamol 500 MG tablet  Commonly known as:  ROBAXIN     ondansetron 4 MG disintegrating tablet  Commonly known as:  ZOFRAN-ODT      TAKE these medications        ALPRAZolam 0.5 MG tablet  Commonly known as:  XANAX  Take 0.5 mg by mouth 2 (two) times daily as needed for anxiety.     amLODipine 10 MG tablet  Commonly known as:  NORVASC  Take 10 mg by mouth daily.     atorvastatin 40 MG tablet  Commonly known as:  LIPITOR  Take 1 tablet (40 mg total) by mouth daily at 6 PM.     carvedilol 25 MG tablet  Commonly known as:  COREG  Take 1 tablet (25 mg total) by mouth 2 (two) times daily with a meal.     cinacalcet 30 MG tablet  Commonly known as:  SENSIPAR  Take 30 mg by mouth daily.     docusate sodium 100 MG capsule  Commonly known as:  COLACE  Take 1 capsule (100 mg total) by mouth 2 (two) times daily.     doxercalciferol 4 MCG/2ML injection  Commonly known as:  HECTOROL  Inject 1.25 mLs (2.5 mcg total) into the vein Every Tuesday,Thursday,and Saturday with dialysis.     gabapentin 100 MG capsule  Commonly known as:  NEURONTIN  Take 100 mg by mouth 3 (three) times daily.     hydrALAZINE 50 MG tablet  Commonly known as:  APRESOLINE  Take 1 tablet (50 mg total) by mouth every 8 (eight) hours.     hydrocerin Crea  Apply 1 application topically 2 (two) times daily.     isosorbide mononitrate 60 MG 24 hr tablet  Commonly known as:  IMDUR  Take 60 mg by mouth daily.     levalbuterol 45 MCG/ACT inhaler  Commonly known as:  XOPENEX HFA  Inhale 2 puffs into the lungs  every 6 (six) hours as needed for wheezing or shortness of breath.     lisinopril  20 MG tablet  Commonly known as:  PRINIVIL,ZESTRIL  Take 1 tablet (20 mg total) by mouth daily.     multivitamin Tabs tablet  Take 1 tablet by mouth at bedtime.     omeprazole 20 MG capsule  Commonly known as:  PRILOSEC  Take 20 mg by mouth daily.     predniSONE 5 MG tablet  Commonly known as:  DELTASONE  Take 5 mg by mouth daily with breakfast.     sevelamer carbonate 800 MG tablet  Commonly known as:  RENVELA  Take 800-1,600 mg by mouth 3 (three) times daily with meals. 2 tabs three times daily with meals, and 1 tablet with snacks     warfarin 6 MG tablet  Commonly known as:  COUMADIN  Take 1 tablet (6 mg total) by mouth daily at 6 PM. Take daily until adjusted per MD          Disposition: 01-Home or Self Care  Discharged Condition: DIXON LUCZAK has met maximum benefit of inpatient care and is medically stable and cleared for discharge.  Patient is pending follow up as above.      Time spent on disposition:  Greater than 35 minutes.   Signed: Nickolas Madrid, NP 08/20/2015  2:44 PM Pager: [phone] or [phone]  PCCM Attending Note: Patient seen and discussed with nurse practitioner. Please refer to discharge summary dictated by her. Patient stable for discharge to home. Has appropriate follow-up with his nephrologist. Understands the list of medications will be given to him outlining medicines to continue and medicines to stop. I am only giving him 2 tablets of his home dose narcotic as recorded given his prior history of drug-seeking. This will allow him to contact his regular physician tomorrow for a refill. Patient understands that missed hemodialysis results in an increased risk of death and he denies any suicidal ideation or wish to die.  I have spent an additional 10 minutes of time planning the patient's discharge from hospital today.  Sonia Baller Ashok Cordia, M.D. [place] Pager:  [phone] After 3pm or if no response, call  [phone]

## 2015-08-21 LAB — MRSA CULTURE: CULTURE: NORMAL

## 2015-08-22 DIAGNOSIS — Z7901 Long term (current) use of anticoagulants: Secondary | ICD-10-CM | POA: Diagnosis not present

## 2015-08-22 DIAGNOSIS — N2581 Secondary hyperparathyroidism of renal origin: Secondary | ICD-10-CM | POA: Diagnosis not present

## 2015-08-22 DIAGNOSIS — D631 Anemia in chronic kidney disease: Secondary | ICD-10-CM | POA: Diagnosis not present

## 2015-08-22 DIAGNOSIS — I10 Essential (primary) hypertension: Secondary | ICD-10-CM | POA: Diagnosis not present

## 2015-08-22 DIAGNOSIS — Z418 Encounter for other procedures for purposes other than remedying health state: Secondary | ICD-10-CM | POA: Diagnosis not present

## 2015-08-22 DIAGNOSIS — Z992 Dependence on renal dialysis: Secondary | ICD-10-CM | POA: Diagnosis not present

## 2015-08-22 DIAGNOSIS — N186 End stage renal disease: Secondary | ICD-10-CM | POA: Diagnosis not present

## 2015-08-24 DIAGNOSIS — D631 Anemia in chronic kidney disease: Secondary | ICD-10-CM | POA: Diagnosis not present

## 2015-08-24 DIAGNOSIS — I509 Heart failure, unspecified: Secondary | ICD-10-CM | POA: Diagnosis not present

## 2015-08-24 DIAGNOSIS — I491 Atrial premature depolarization: Secondary | ICD-10-CM | POA: Diagnosis not present

## 2015-08-24 DIAGNOSIS — Z9225 Personal history of immunosupression therapy: Secondary | ICD-10-CM | POA: Diagnosis not present

## 2015-08-24 DIAGNOSIS — Z418 Encounter for other procedures for purposes other than remedying health state: Secondary | ICD-10-CM | POA: Diagnosis not present

## 2015-08-24 DIAGNOSIS — N50811 Right testicular pain: Secondary | ICD-10-CM | POA: Diagnosis not present

## 2015-08-24 DIAGNOSIS — N186 End stage renal disease: Secondary | ICD-10-CM | POA: Diagnosis not present

## 2015-08-24 DIAGNOSIS — R188 Other ascites: Secondary | ICD-10-CM | POA: Diagnosis not present

## 2015-08-24 DIAGNOSIS — E1139 Type 2 diabetes mellitus with other diabetic ophthalmic complication: Secondary | ICD-10-CM | POA: Diagnosis not present

## 2015-08-24 DIAGNOSIS — Z87891 Personal history of nicotine dependence: Secondary | ICD-10-CM | POA: Diagnosis not present

## 2015-08-24 DIAGNOSIS — R1031 Right lower quadrant pain: Secondary | ICD-10-CM | POA: Diagnosis not present

## 2015-08-24 DIAGNOSIS — N433 Hydrocele, unspecified: Secondary | ICD-10-CM | POA: Diagnosis not present

## 2015-08-24 DIAGNOSIS — N50819 Testicular pain, unspecified: Secondary | ICD-10-CM | POA: Diagnosis not present

## 2015-08-24 DIAGNOSIS — N2581 Secondary hyperparathyroidism of renal origin: Secondary | ICD-10-CM | POA: Diagnosis not present

## 2015-08-24 DIAGNOSIS — I132 Hypertensive heart and chronic kidney disease with heart failure and with stage 5 chronic kidney disease, or end stage renal disease: Secondary | ICD-10-CM | POA: Diagnosis not present

## 2015-08-24 DIAGNOSIS — Z86718 Personal history of other venous thrombosis and embolism: Secondary | ICD-10-CM | POA: Diagnosis not present

## 2015-08-24 DIAGNOSIS — Z992 Dependence on renal dialysis: Secondary | ICD-10-CM | POA: Diagnosis not present

## 2015-08-24 DIAGNOSIS — Z79899 Other long term (current) drug therapy: Secondary | ICD-10-CM | POA: Diagnosis not present

## 2015-08-24 DIAGNOSIS — N1330 Unspecified hydronephrosis: Secondary | ICD-10-CM | POA: Diagnosis not present

## 2015-08-24 DIAGNOSIS — N451 Epididymitis: Secondary | ICD-10-CM | POA: Diagnosis not present

## 2015-08-24 DIAGNOSIS — Z940 Kidney transplant status: Secondary | ICD-10-CM | POA: Diagnosis not present

## 2015-08-25 ENCOUNTER — Emergency Department (HOSPITAL_COMMUNITY)
Admission: EM | Admit: 2015-08-25 | Discharge: 2015-08-25 | Disposition: A | Discharge status: Home / Self Care | Payer: Medicare Other | Attending: Emergency Medicine | Admitting: Emergency Medicine

## 2015-08-25 ENCOUNTER — Encounter (HOSPITAL_COMMUNITY): Payer: Self-pay | Admitting: Family Medicine

## 2015-08-25 ENCOUNTER — Emergency Department (HOSPITAL_COMMUNITY): Payer: Medicare Other

## 2015-08-25 DIAGNOSIS — Z940 Kidney transplant status: Secondary | ICD-10-CM | POA: Diagnosis not present

## 2015-08-25 DIAGNOSIS — R1084 Generalized abdominal pain: Secondary | ICD-10-CM | POA: Insufficient documentation

## 2015-08-25 DIAGNOSIS — I509 Heart failure, unspecified: Secondary | ICD-10-CM | POA: Insufficient documentation

## 2015-08-25 DIAGNOSIS — R1032 Left lower quadrant pain: Secondary | ICD-10-CM | POA: Diagnosis not present

## 2015-08-25 DIAGNOSIS — Z992 Dependence on renal dialysis: Secondary | ICD-10-CM | POA: Insufficient documentation

## 2015-08-25 DIAGNOSIS — Z79899 Other long term (current) drug therapy: Secondary | ICD-10-CM | POA: Insufficient documentation

## 2015-08-25 DIAGNOSIS — N50819 Testicular pain, unspecified: Secondary | ICD-10-CM | POA: Diagnosis not present

## 2015-08-25 DIAGNOSIS — F419 Anxiety disorder, unspecified: Secondary | ICD-10-CM | POA: Insufficient documentation

## 2015-08-25 DIAGNOSIS — R1030 Lower abdominal pain, unspecified: Secondary | ICD-10-CM

## 2015-08-25 DIAGNOSIS — E119 Type 2 diabetes mellitus without complications: Secondary | ICD-10-CM | POA: Diagnosis not present

## 2015-08-25 DIAGNOSIS — N186 End stage renal disease: Secondary | ICD-10-CM | POA: Insufficient documentation

## 2015-08-25 DIAGNOSIS — Z87891 Personal history of nicotine dependence: Secondary | ICD-10-CM | POA: Insufficient documentation

## 2015-08-25 DIAGNOSIS — Z79810 Long term (current) use of selective estrogen receptor modulators (SERMs): Secondary | ICD-10-CM | POA: Insufficient documentation

## 2015-08-25 DIAGNOSIS — I120 Hypertensive chronic kidney disease with stage 5 chronic kidney disease or end stage renal disease: Secondary | ICD-10-CM | POA: Diagnosis not present

## 2015-08-25 DIAGNOSIS — Z7901 Long term (current) use of anticoagulants: Secondary | ICD-10-CM | POA: Diagnosis not present

## 2015-08-25 DIAGNOSIS — F329 Major depressive disorder, single episode, unspecified: Secondary | ICD-10-CM | POA: Diagnosis not present

## 2015-08-25 DIAGNOSIS — R188 Other ascites: Secondary | ICD-10-CM | POA: Diagnosis not present

## 2015-08-25 DIAGNOSIS — Z862 Personal history of diseases of the blood and blood-forming organs and certain disorders involving the immune mechanism: Secondary | ICD-10-CM | POA: Insufficient documentation

## 2015-08-25 DIAGNOSIS — Z9889 Other specified postprocedural states: Secondary | ICD-10-CM | POA: Diagnosis not present

## 2015-08-25 DIAGNOSIS — N1339 Other hydronephrosis: Secondary | ICD-10-CM | POA: Diagnosis not present

## 2015-08-25 DIAGNOSIS — R1031 Right lower quadrant pain: Secondary | ICD-10-CM | POA: Diagnosis not present

## 2015-08-25 LAB — COMPREHENSIVE METABOLIC PANEL
ALT: 16 U/L — ABNORMAL LOW (ref 17–63)
ANION GAP: 13 (ref 5–15)
AST: 54 U/L — ABNORMAL HIGH (ref 15–41)
Albumin: 3.1 g/dL — ABNORMAL LOW (ref 3.5–5.0)
Alkaline Phosphatase: 122 U/L (ref 38–126)
BILIRUBIN TOTAL: 0.4 mg/dL (ref 0.3–1.2)
BUN: 44 mg/dL — AB (ref 6–20)
CO2: 25 mmol/L (ref 22–32)
Calcium: 8.9 mg/dL (ref 8.9–10.3)
Chloride: 101 mmol/L (ref 101–111)
Creatinine, Ser: 8.94 mg/dL — ABNORMAL HIGH (ref 0.61–1.24)
GFR, EST AFRICAN AMERICAN: 7 mL/min — AB (ref >60)
GFR, EST NON AFRICAN AMERICAN: 6 mL/min — AB (ref >60)
Glucose, Bld: 101 mg/dL — ABNORMAL HIGH (ref 65–99)
POTASSIUM: 5.1 mmol/L (ref 3.5–5.1)
Sodium: 139 mmol/L (ref 135–145)
TOTAL PROTEIN: 7.8 g/dL (ref 6.5–8.1)

## 2015-08-25 LAB — CBC WITH DIFFERENTIAL/PLATELET
BASOS ABS: 0 10*3/uL (ref 0.0–0.1)
Basophils Relative: 0 %
EOS ABS: 0.5 10*3/uL (ref 0.0–0.7)
Eosinophils Relative: 6 %
HCT: 27.8 % — ABNORMAL LOW (ref 39.0–52.0)
HEMOGLOBIN: 8.9 g/dL — AB (ref 13.0–17.0)
LYMPHS PCT: 17 %
Lymphs Abs: 1.5 10*3/uL (ref 0.7–4.0)
MCH: 28.6 pg (ref 26.0–34.0)
MCHC: 32 g/dL (ref 30.0–36.0)
MCV: 89.4 fL (ref 78.0–100.0)
Monocytes Absolute: 0.5 10*3/uL (ref 0.1–1.0)
Monocytes Relative: 6 %
NEUTROS ABS: 6.2 10*3/uL (ref 1.7–7.7)
NEUTROS PCT: 71 %
Platelets: 183 10*3/uL (ref 150–400)
RBC: 3.11 MIL/uL — ABNORMAL LOW (ref 4.22–5.81)
RDW: 15.6 % — ABNORMAL HIGH (ref 11.5–15.5)
WBC: 8.7 10*3/uL (ref 4.0–10.5)

## 2015-08-25 LAB — LIPASE, BLOOD: LIPASE: 42 U/L (ref 11–51)

## 2015-08-25 LAB — PROTIME-INR
INR: 1.29 (ref 0.00–1.49)
PROTHROMBIN TIME: 16.2 s — AB (ref 11.6–15.2)

## 2015-08-25 MED ORDER — HYDRALAZINE HCL 25 MG PO TABS
50.0000 mg | ORAL_TABLET | Freq: Once | ORAL | Status: AC
  Administered 2015-08-25: 50 mg via ORAL
  Filled 2015-08-25: qty 2

## 2015-08-25 MED ORDER — CARVEDILOL 12.5 MG PO TABS: 25.0000 mg | ORAL_TABLET | Freq: Two times a day (BID) | ORAL | Status: DC

## 2015-08-25 MED ORDER — CARVEDILOL 12.5 MG PO TABS
25.0000 mg | ORAL_TABLET | ORAL | Status: AC
  Administered 2015-08-25: 25 mg via ORAL
  Filled 2015-08-25: qty 2

## 2015-08-25 MED ORDER — AMLODIPINE BESYLATE 5 MG PO TABS
10.0000 mg | ORAL_TABLET | Freq: Once | ORAL | Status: AC
  Administered 2015-08-25: 10 mg via ORAL
  Filled 2015-08-25: qty 2

## 2015-08-25 MED ORDER — ONDANSETRON HCL 4 MG/2ML IJ SOLN
4.0000 mg | Freq: Once | INTRAMUSCULAR | Status: AC
  Administered 2015-08-25: 4 mg via INTRAVENOUS
  Filled 2015-08-25: qty 2

## 2015-08-25 MED ORDER — HYDROCODONE-ACETAMINOPHEN 5-325 MG PO TABS: 1.0000 | ORAL_TABLET | Freq: Four times a day (QID) | ORAL | Status: DC | PRN

## 2015-08-25 MED ORDER — LISINOPRIL 20 MG PO TABS
20.0000 mg | ORAL_TABLET | Freq: Once | ORAL | Status: AC
  Administered 2015-08-25: 20 mg via ORAL
  Filled 2015-08-25: qty 1

## 2015-08-25 MED ORDER — MORPHINE SULFATE (PF) 4 MG/ML IV SOLN
4.0000 mg | Freq: Once | INTRAVENOUS | Status: AC
  Administered 2015-08-25: 4 mg via INTRAVENOUS
  Filled 2015-08-25: qty 1

## 2015-08-25 NOTE — ED Notes (Signed)
Pt here for bilateral groin pain more on the right side and sts recent hernia surgery. sts 4 days ago.

## 2015-08-25 NOTE — Discharge Instructions (Signed)
Your labs and exam today were normal. I will give you a small prescription for pain medicine to take as needed. Per our discussion, I have also given you the contact information for surgery and urology. Please also see the list below to establish primary care for follow up.  Take medications as prescribed. Return to the emergency room for worsening condition or new concerning symptoms. Follow up with your regular doctor. If you don't have a regular doctor use one of the numbers below to establish a primary care doctor.   Emergency Department Resource Guide 1) Find a Doctor and Pay Out of Pocket Although you won't have to find out who is covered by your insurance plan, it is a good idea to ask around and get recommendations. You will then need to call the office and see if the doctor you have chosen will accept you as a new patient and what types of options they offer for patients who are self-pay. Some doctors offer discounts or will set up payment plans for their patients who do not have insurance, but you will need to ask so you aren't surprised when you get to your appointment.  2) Contact Your Local Health Department Not all health departments have doctors that can see patients for sick visits, but many do, so it is worth a call to see if yours does. If you don't know where your local health department is, you can check in your phone book. The CDC also has a tool to help you locate your state's health department, and many state websites also have listings of all of their local health departments.  3) Find a [facility] If your illness is not likely to be very severe or complicated, you may want to try a walk in clinic. These are popping up all over the country in pharmacies, drugstores, and shopping centers. They're usually staffed by nurse practitioners or physician assistants that have been trained to treat common illnesses and complaints. They're usually fairly quick and inexpensive. However,  if you have serious medical issues or chronic medical problems, these are probably not your best option.  No Primary Care Doctor: - Call Health Connect at  [phone] - they can help you locate a primary care doctor that  accepts your insurance, provides certain services, etc. - Physician Referral [phone]  Emergency Department Resource Guide 1) Find a Doctor and Pay Out of Pocket Although you won't have to find out who is covered by your insurance plan, it is a good idea to ask around and get recommendations. You will then need to call the office and see if the doctor you have chosen will accept you as a new patient and what types of options they offer for patients who are self-pay. Some doctors offer discounts or will set up payment plans for their patients who do not have insurance, but you will need to ask so you aren't surprised when you get to your appointment.  2) Contact Your Local Health Department Not all health departments have doctors that can see patients for sick visits, but many do, so it is worth a call to see if yours does. If you don't know where your local health department is, you can check in your phone book. The CDC also has a tool to help you locate your state's health department, and many state websites also have listings of all of their local health departments.  3) Find a [facility] If your illness is not likely to  be very severe or complicated, you may want to try a walk in clinic. These are popping up all over the country in pharmacies, drugstores, and shopping centers. They're usually staffed by nurse practitioners or physician assistants that have been trained to treat common illnesses and complaints. They're usually fairly quick and inexpensive. However, if you have serious medical issues or chronic medical problems, these are probably not your best option.  No Primary Care Doctor: - Call Health Connect at  [phone] - they can help you locate a primary  care doctor that  accepts your insurance, provides certain services, etc. - Physician Referral Service- [phone]  Chronic Pain Problems: Organization         Address  Phone   Notes  [facility]  [phone] Patients need to be referred by their primary care doctor.   Medication Assistance: Organization         Address  Phone   Notes  [facility] Medication [facility] [address] [place], [place] [address] [phone] --Must be a resident of [facility] -- Must have NO insurance coverage whatsoever (no Medicaid/ Medicare, etc.) -- The pt. MUST have a primary care doctor that directs their care regularly and follows them in the community   [facility]  [phone]   [employer]  [phone]    Agencies that provide inexpensive medical care: Organization         Address  Phone   Notes  [place]  [phone]   [facility]    [phone]   [facility] [place], [place] [address] [phone]   [place] [address] [place] [phone]   [facility]anned Parenthood    [phone]   [facility]    [phone]   [place] and [place] [address], [place] Phone:  [phone], Fax:  [phone] Hours of Operation:  9 am - 6 pm, M-F.  Also accepts Medicaid/Medicare and self-pay.  [facility] for [place], [address], [place] Phone: [phone], Fax: [phone]. Hours of Operation:  8:30 am - 5:30 pm, M-F.  Also accepts Medicaid and self-pay.  [facility] [place] [address] [place] Phone: [phone]   [place], [place], [place] [phone], Ext. [phone] Mondays & Thursdays: 7-9 AM.  First 15 patients are seen on a first come, first serve basis.    [place]  Providers:  Organization         Address  Phone   Notes  [facility] [address], [place] [phone] Also accepts self-pay patients.  [facility] [address], [place]  [phone]   [place], [address], [place] [phone]   [facility] [address] [place] [phone]   Lucianne Lei [address], [place]   [phone] Only accepts [place] patients after they have their name applied to their card.   Self-Pay (no insurance) in [facility]:  Organization         Address  Phone   Notes  Sickle Cell Patients, [facility] [place] [phone]   [facility] [place] [phone]   [facility] [place]  [address], [place] [phone]   [facility]/Dr. Osei-Bonsu  [address], [place] or [address], [place] [phone] Phone number for both [place] and [place] locations is the same.  [facility] [address] [place] [phone]   [facility] [address], [place] or [address] [phone]   [facility] [address] [place] [phone], phone; [phone], fax Sees patients 1st and 3rd Saturday of every month.  Must not qualify for public or private insurance (i.e. Medicaid, Medicare, [place] Health Choice, Veterans' Benefits)  Household income should be no more than 200% of the poverty level The clinic cannot treat you if you are pregnant or think you are pregnant  Sexually transmitted diseases are not treated at the clinic.

## 2015-08-25 NOTE — ED Notes (Signed)
Pt ambulates independently and with steady gait at time of discharge. Discharge instructions and follow up information reviewed with patient. No other questions or concerns voiced at this time. RX x 1 gven

## 2015-08-25 NOTE — ED Provider Notes (Signed)
CSN: [id number]     Arrival date & time 08/25/15  1222 History   First MD Initiated Contact with Patient 08/25/15 1308     Chief Complaint  Patient presents with  . Hernia  . Groin Pain    HPI  Frank [name] is an 50 y.o. male with history of ESRD (T/Th/Sat dialysis schedule), DM, HTN, CHF s/p right inguinal hernia repair, chronic abdominal and scrotal pain, who presents to the ED for evaluation of groin and lower abdominal pain. Pt states he is here because he "knows" he has a hernia. He complains of RLQ pain that radiates to his right scrotum. He states sometimes the pain is in his LLQ. He denies recent injury or trauma. Denies pain onset with exertion, heavy lifting, coughing/sneezing. States he has had this flare of pain for the past two days. He was notably seen in the [facility] ED yesterday evening with negative workup including CT abd/pelvis and scrotal US. In the ED today he is markedly hypertensive at triage; states he did go to dialysis yesterday and has taken his home BP medications (coreg, lisinopril, norvasc, and hydralazine) this morning. Denies chest pain or SOB. Denies new edema. States he has not taken any meds at home for the pain.   Past Medical History  Diagnosis Date  . Hypertension   . Depression   . Complication of anesthesia     itching, sore throat  . Diabetes mellitus without complication ([place])     No history per patient, but remains under history as A1c would not be accurate given on dialysis  . Shortness of breath   . Anxiety   . ESRD (end stage renal disease) ([place])     due to HTN per patient, followed at [facility], s/p failed kidney transplant - dialysis Tue, Th, Sat  . Renal insufficiency   . CHF (congestive heart failure) ([place])   . Anemia   . Dialysis patient [facility])    Past Surgical History  Procedure Laterality Date  . Kidney receipient  2006    failed and started HD in March 2014  . Capd insertion    . Capd removal    . Left heart catheterization with coronary  angiogram N/A 09/02/2014    Procedure: LEFT HEART CATHETERIZATION WITH CORONARY ANGIOGRAM;  Surgeon: [name], MD;  Location: [facility] CATH LAB;  Service: Cardiovascular;  Laterality: N/A;  . Inguinal hernia repair Right 02/14/2015    Procedure: REPAIR INCARCERATED RIGHT INGUINAL HERNIA;  Surgeon: [name], MD;  Location: [facility];  Service: General;  Laterality: Right;   History reviewed. No pertinent family history. Social History  Substance Use Topics  . Smoking status: Former Smoker -- 0.00 packs/day for 1 years    Types: Cigarettes  . Smokeless tobacco: Never Used     Comment: quit Jan 2014  . Alcohol Use: No    Review of Systems  All other systems reviewed and are negative.     Allergies  Butalbital-apap-caffeine; Ferrlecit; Minoxidil; Darvocet; and Propoxyphene  Home Medications   Prior to Admission medications   Medication Sig Start Date End Date Taking? Authorizing Provider  ALPRAZolam [name]) 0.5 MG tablet Take 0.5 mg by mouth 2 (two) times daily as needed for anxiety.    Historical Provider, MD  amLODipine (NORVASC) 10 MG tablet Take 10 mg by mouth daily.    Historical Provider, MD  atorvastatin (LIPITOR) 40 MG tablet Take 1 tablet (40 mg total) by mouth daily at 6 PM. 09/02/14   Clifton James  Dyann Kief, MD  carvedilol (COREG) 25 MG tablet Take 1 tablet (25 mg total) by mouth 2 (two) times daily with a meal. 10/10/14   [name], DO  cinacalcet (SENSIPAR) 30 MG tablet Take 30 mg by mouth daily.    Historical Provider, MD  docusate sodium (COLACE) 100 MG capsule Take 1 capsule (100 mg total) by mouth 2 (two) times daily. 11/19/14   [name], MD  doxercalciferol (HECTOROL) 4 MCG/2ML injection Inject 1.25 mLs (2.5 mcg total) into the vein Every Tuesday,Thursday,and Saturday with dialysis. 10/10/14   [name], DO  gabapentin (NEURONTIN) 100 MG capsule Take 100 mg by mouth 3 (three) times daily. 06/14/15 09/12/15  Historical Provider, MD  hydrALAZINE (APRESOLINE) 50 MG  tablet Take 1 tablet (50 mg total) by mouth every 8 (eight) hours. 02/04/15   [name], MD  hydrocerin (EUCERIN) CREA Apply 1 application topically 2 (two) times daily. 02/04/15   [name], MD  HYDROcodone-acetaminophen (NORCO/VICODIN) 5-325 MG tablet Take 1 tablet by mouth 2 (two) times daily as needed for moderate pain or severe pain. 08/20/15   Marijean Heath, NP  isosorbide mononitrate (IMDUR) 60 MG 24 hr tablet Take 60 mg by mouth daily. 06/14/15 09/12/15  Historical Provider, MD  levalbuterol Penne Lash HFA) 45 MCG/ACT inhaler Inhale 2 puffs into the lungs every 6 (six) hours as needed for wheezing or shortness of breath. 11/19/14   [name], MD  lisinopril (PRINIVIL,ZESTRIL) 20 MG tablet Take 1 tablet (20 mg total) by mouth daily. 02/04/15   [name], MD  multivitamin (RENA-VIT) TABS tablet Take 1 tablet by mouth at bedtime. 02/04/15   [name], MD  omeprazole (PRILOSEC) 20 MG capsule Take 20 mg by mouth daily. 07/01/13   Historical Provider, MD  predniSONE (DELTASONE) 5 MG tablet Take 5 mg by mouth daily with breakfast.    Historical Provider, MD  sevelamer carbonate (RENVELA) 800 MG tablet Take 800-1,600 mg by mouth 3 (three) times daily with meals. 2 tabs three times daily with meals, and 1 tablet with snacks    Historical Provider, MD  warfarin (COUMADIN) 6 MG tablet Take 1 tablet (6 mg total) by mouth daily at 6 PM. Take daily until adjusted per MD 02/17/15   Eugenie Filler, MD   BP 198/132 mmHg  Pulse 97  Temp(Src) 97.6 F (36.4 C) (Oral)  Resp 18  SpO2 99% Physical Exam  Constitutional: He is oriented to person, place, and time.  Pt laying in bed, NAD. As we begin exam he starts moaning and crying out in pain.  HENT:  Right Ear: External ear normal.  Left Ear: External ear normal.  Nose: Nose normal.  Mouth/Throat: Oropharynx is clear and moist. No oropharyngeal exudate.  Eyes: Conjunctivae and EOM are normal. Pupils are equal, round, and reactive to  light.  Neck: Normal range of motion. Neck supple.  Cardiovascular: Normal rate, regular rhythm, normal heart sounds and intact distal pulses.   Pulmonary/Chest: Effort normal and breath sounds normal. No respiratory distress. He has no wheezes. He exhibits no tenderness.  Abdominal: Soft. Bowel sounds are normal. He exhibits no distension. There is no rebound and no guarding.  Pt's abdomen is diffusely tender. Multiple times he grabs my arms and will not let me palpate.   Genitourinary:  No scrotal or penile swelling or erythema noted. Difficult to assess scrotal tenderness due to pt grabbing my hands asking me to stop  Musculoskeletal: He exhibits no  edema.  Neurological: He is alert and oriented to person, place, and time. No cranial nerve deficit.  Skin: Skin is warm and dry.  Psychiatric: He has a normal mood and affect.  Nursing note and vitals reviewed.   ED Course  Procedures (including critical care time) Labs Review Labs Reviewed  COMPREHENSIVE METABOLIC PANEL - Abnormal; Notable for the following:    Glucose, Bld 101 (*)    BUN 44 (*)    Creatinine, Ser 8.94 (*)    Albumin 3.1 (*)    AST 54 (*)    ALT 16 (*)    GFR calc non Af Amer 6 (*)    GFR calc Af Amer 7 (*)    All other components within normal limits  CBC WITH DIFFERENTIAL/PLATELET - Abnormal; Notable for the following:    RBC 3.11 (*)    Hemoglobin 8.9 (*)    HCT 27.8 (*)    RDW 15.6 (*)    All other components within normal limits  PROTIME-INR - Abnormal; Notable for the following:    Prothrombin Time 16.2 (*)    All other components within normal limits  LIPASE, BLOOD    Imaging Review Dg Abd Acute W/chest  08/25/2015  CLINICAL DATA:  BILATERAL groin pain greater on RIGHT, recent hernia surgery 4 days ago, hypertension, diabetes mellitus, end- stage renal disease on dialysis, CHF, former smoker EXAM: DG ABDOMEN ACUTE W/ 1V CHEST COMPARISON:  08/19/2015 chest radiograph, abdominal radiograph 02/11/2015  FINDINGS: RIGHT jugular dual-lumen central venous catheter with tip projecting over high RIGHT atrium. Enlargement of cardiac silhouette with pulmonary vascular congestion. Central peribronchial thickening with question minimal perihilar edema asymmetric on RIGHT. Remaining lungs clear. No definite pleural effusion or pneumothorax. Normal bowel gas pattern with scattered stool and gas throughout colon. No bowel dilatation, bowel wall thickening, or free intraperitoneal air. Osseous structures unremarkable. Scattered atherosclerotic calcifications. No definite urinary tract calcification. IMPRESSION: Enlargement of cardiac silhouette with pulmonary vascular congestion and question minimal asymmetric RIGHT perihilar edema. No acute abdominal findings. Electronically Signed   By: Lavonia Dana M.D.   On: 08/25/2015 14:37   I have personally reviewed and evaluated these images and lab results as part of my medical decision-making.   EKG Interpretation   Date/Time:  Friday August 25 2015 15:26:39 EST Ventricular Rate:  91 PR Interval:  165 QRS Duration: 90 QT Interval:  399 QTC Calculation: 491 R Axis:   -21 Text Interpretation:  Sinus rhythm Probable left atrial enlargement LVH  with secondary repolarization abnormality Borderline prolonged QT interval  Baseline wander in lead(s) V2 No significant change since last tracing  Confirmed by YAO  MD, DAVID ([id number]) on 08/25/2015 4:09:09 PM      MDM   Final diagnoses:  Lower abdominal pain  Groin pain, unspecified laterality    Pt is an 50 y.o. male s/p right hernia repair presenting with bilateral lower abdominal pain with radiation to his scrotum. CT abd/pelvis and [facility]rotal [facility] yesterday unremarkable. Initially diffusely tender on my exam but no notable scrotal or abdominal swelling or erythema, no palpable ventral or inguinal hernia. Initially hypertensive to 604V systolic. Home BP meds given, 1 dose IV pain meds given. BP improved, pain  improved. Pt's labs at baseline. CXR shows questionable minimal right perihilar edema, otherwise unremarkable. Discussed with attending MD Darl Householder. At this point pt's elevated BP appears related to pain. His pain appears chronic. Pt stable for discharge. He is requesting an abdominal binder which I ordered for  him. Discussed with pt that given his multiple negative ED workups and ongoing pain I believe he would benefit from evaluation by surgery and urology. He also needs PCP. Referrals given for all of the aforementioned providers. Will give short course of Norco for pain. ER return precautions given. Pt verbalized agreement and understanding with plan.     Anne Ng, PA-C 08/25/15 [place], MD 08/25/15 1640

## 2015-08-26 DIAGNOSIS — Z418 Encounter for other procedures for purposes other than remedying health state: Secondary | ICD-10-CM | POA: Diagnosis not present

## 2015-08-26 DIAGNOSIS — N2581 Secondary hyperparathyroidism of renal origin: Secondary | ICD-10-CM | POA: Diagnosis not present

## 2015-08-26 DIAGNOSIS — N186 End stage renal disease: Secondary | ICD-10-CM | POA: Diagnosis not present

## 2015-08-26 DIAGNOSIS — D631 Anemia in chronic kidney disease: Secondary | ICD-10-CM | POA: Diagnosis not present

## 2015-08-26 LAB — OPIATES,MS,WB/SP RFX
6-ACETYLMORPHINE: NEGATIVE
Codeine: NEGATIVE ng/mL
DIHYDROCODEINE: NEGATIVE ng/mL
HYDROCODONE: NEGATIVE ng/mL
Hydromorphone: NEGATIVE ng/mL
Morphine: NEGATIVE ng/mL
OPIATE CONFIRMATION: NEGATIVE

## 2015-08-26 LAB — OXYCODONES,MS,WB/SP RFX
OXYCOCONE: NEGATIVE ng/mL
OXYCODONES CONFIRMATION: NEGATIVE
Oxymorphone: NEGATIVE ng/mL

## 2015-08-29 DIAGNOSIS — Z86718 Personal history of other venous thrombosis and embolism: Secondary | ICD-10-CM | POA: Diagnosis not present

## 2015-08-29 DIAGNOSIS — I2699 Other pulmonary embolism without acute cor pulmonale: Secondary | ICD-10-CM | POA: Diagnosis not present

## 2015-08-29 DIAGNOSIS — D631 Anemia in chronic kidney disease: Secondary | ICD-10-CM | POA: Diagnosis not present

## 2015-08-29 DIAGNOSIS — Z418 Encounter for other procedures for purposes other than remedying health state: Secondary | ICD-10-CM | POA: Diagnosis not present

## 2015-08-29 DIAGNOSIS — I509 Heart failure, unspecified: Secondary | ICD-10-CM | POA: Diagnosis not present

## 2015-08-29 DIAGNOSIS — R0602 Shortness of breath: Secondary | ICD-10-CM | POA: Diagnosis not present

## 2015-08-29 DIAGNOSIS — D649 Anemia, unspecified: Secondary | ICD-10-CM | POA: Diagnosis not present

## 2015-08-29 DIAGNOSIS — R9431 Abnormal electrocardiogram [ECG] [EKG]: Secondary | ICD-10-CM | POA: Diagnosis not present

## 2015-08-29 DIAGNOSIS — Z992 Dependence on renal dialysis: Secondary | ICD-10-CM | POA: Diagnosis not present

## 2015-08-29 DIAGNOSIS — I120 Hypertensive chronic kidney disease with stage 5 chronic kidney disease or end stage renal disease: Secondary | ICD-10-CM | POA: Diagnosis not present

## 2015-08-29 DIAGNOSIS — Z7901 Long term (current) use of anticoagulants: Secondary | ICD-10-CM | POA: Diagnosis not present

## 2015-08-29 DIAGNOSIS — Z86711 Personal history of pulmonary embolism: Secondary | ICD-10-CM | POA: Diagnosis not present

## 2015-08-29 DIAGNOSIS — N2581 Secondary hyperparathyroidism of renal origin: Secondary | ICD-10-CM | POA: Diagnosis not present

## 2015-08-29 DIAGNOSIS — R0600 Dyspnea, unspecified: Secondary | ICD-10-CM | POA: Diagnosis not present

## 2015-08-29 DIAGNOSIS — N186 End stage renal disease: Secondary | ICD-10-CM | POA: Diagnosis not present

## 2015-08-29 DIAGNOSIS — R0989 Other specified symptoms and signs involving the circulatory and respiratory systems: Secondary | ICD-10-CM | POA: Diagnosis not present

## 2015-08-29 DIAGNOSIS — I313 Pericardial effusion (noninflammatory): Secondary | ICD-10-CM | POA: Diagnosis not present

## 2015-08-29 DIAGNOSIS — R51 Headache: Secondary | ICD-10-CM | POA: Diagnosis not present

## 2015-08-29 DIAGNOSIS — R0609 Other forms of dyspnea: Secondary | ICD-10-CM | POA: Diagnosis not present

## 2015-08-29 DIAGNOSIS — G4733 Obstructive sleep apnea (adult) (pediatric): Secondary | ICD-10-CM | POA: Diagnosis not present

## 2015-08-29 DIAGNOSIS — I5042 Chronic combined systolic (congestive) and diastolic (congestive) heart failure: Secondary | ICD-10-CM | POA: Diagnosis not present

## 2015-08-29 DIAGNOSIS — I132 Hypertensive heart and chronic kidney disease with heart failure and with stage 5 chronic kidney disease, or end stage renal disease: Secondary | ICD-10-CM | POA: Diagnosis not present

## 2015-08-29 DIAGNOSIS — Z87891 Personal history of nicotine dependence: Secondary | ICD-10-CM | POA: Diagnosis not present

## 2015-08-29 DIAGNOSIS — I161 Hypertensive emergency: Secondary | ICD-10-CM | POA: Diagnosis not present

## 2015-08-29 DIAGNOSIS — I429 Cardiomyopathy, unspecified: Secondary | ICD-10-CM | POA: Diagnosis not present

## 2015-08-29 DIAGNOSIS — I159 Secondary hypertension, unspecified: Secondary | ICD-10-CM | POA: Diagnosis not present

## 2015-08-29 DIAGNOSIS — I4581 Long QT syndrome: Secondary | ICD-10-CM | POA: Diagnosis not present

## 2015-08-29 DIAGNOSIS — R7989 Other specified abnormal findings of blood chemistry: Secondary | ICD-10-CM | POA: Diagnosis not present

## 2015-08-29 LAB — DRUG SCREEN 10 W/CONF, SERUM
AMPHETAMINES, IA: NEGATIVE ng/mL
BARBITURATES, IA: NEGATIVE ug/mL
Benzodiazepines, IA: NEGATIVE ng/mL
Cocaine & Metabolite, IA: NEGATIVE ng/mL
METHADONE, IA: NEGATIVE ng/mL
OPIATES, IA: NEGATIVE ng/mL
Oxycodones, IA: NEGATIVE ng/mL
Phencyclidine, IA: NEGATIVE ng/mL
Propoxyphene, IA: NEGATIVE ng/mL

## 2015-08-29 IMAGING — CR DG TOE GREAT 2+V*L*
3 series · 3 of 3 positions shown · non-contrast
Comparison: None.

CLINICAL DATA: Distal left 1st toe pain

EXAM:
LEFT GREAT TOE

[x toes ap left]
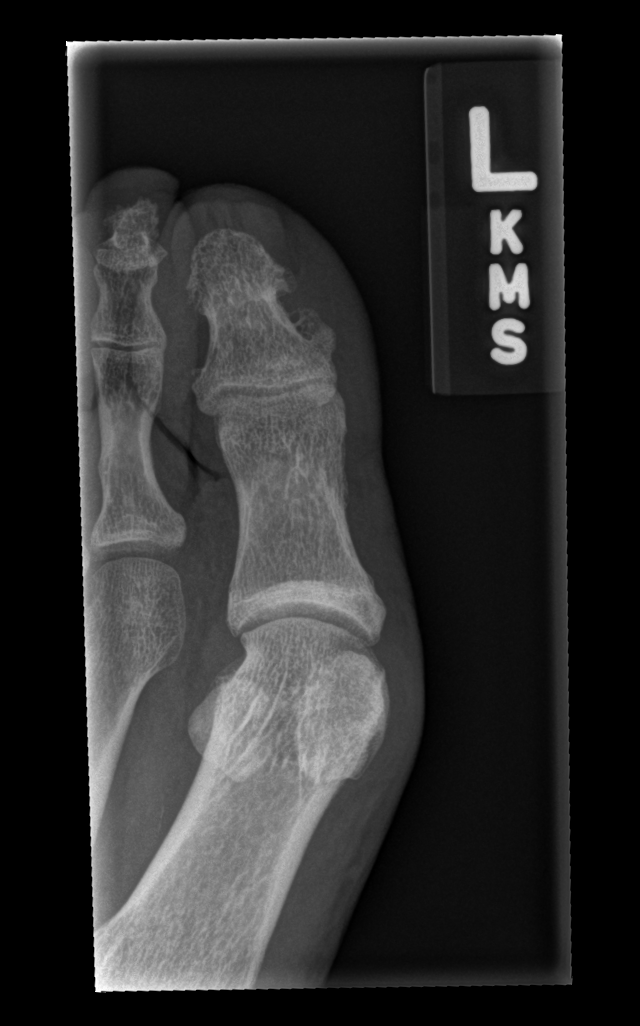

[x toes obl left]
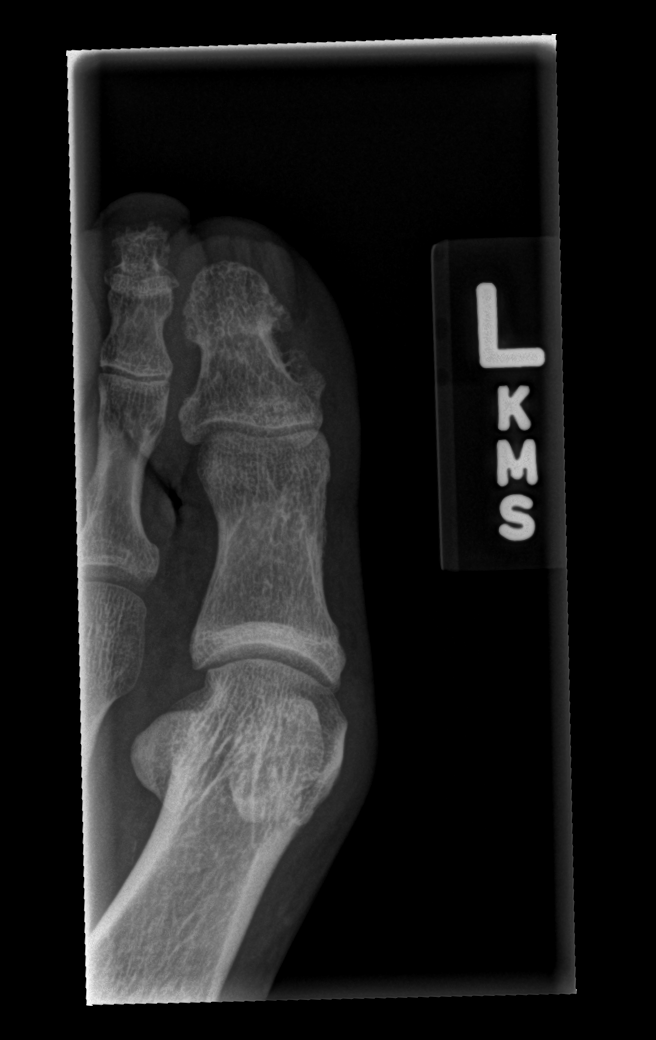

[x toes lat left]
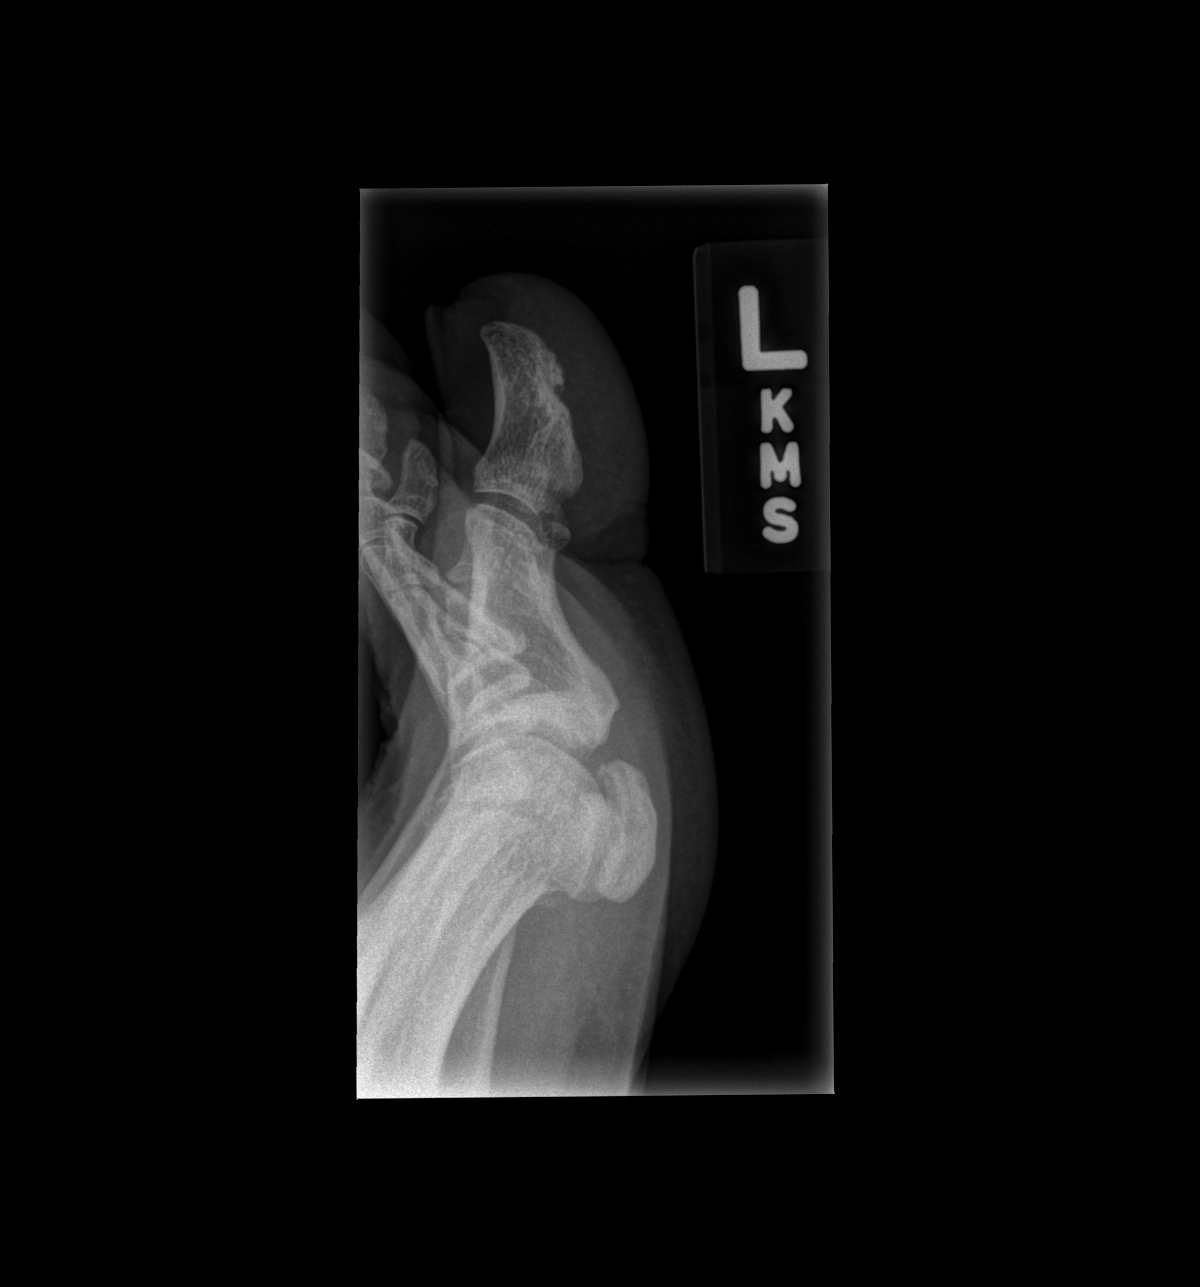

[3 of 3 positions shown; findings below may reference images not displayed]

FINDINGS: No fracture or dislocation is seen.

The joint spaces are preserved.

The visualized soft tissues are unremarkable.
IMPRESSION: No acute osseous abnormality is seen.

## 2015-08-30 DIAGNOSIS — D649 Anemia, unspecified: Secondary | ICD-10-CM | POA: Diagnosis not present

## 2015-08-30 DIAGNOSIS — I120 Hypertensive chronic kidney disease with stage 5 chronic kidney disease or end stage renal disease: Secondary | ICD-10-CM | POA: Diagnosis not present

## 2015-08-30 DIAGNOSIS — I161 Hypertensive emergency: Secondary | ICD-10-CM | POA: Diagnosis not present

## 2015-08-30 DIAGNOSIS — N186 End stage renal disease: Secondary | ICD-10-CM | POA: Diagnosis not present

## 2015-08-30 DIAGNOSIS — G8929 Other chronic pain: Secondary | ICD-10-CM | POA: Diagnosis not present

## 2015-08-30 DIAGNOSIS — Z79899 Other long term (current) drug therapy: Secondary | ICD-10-CM | POA: Diagnosis not present

## 2015-08-30 DIAGNOSIS — Z992 Dependence on renal dialysis: Secondary | ICD-10-CM | POA: Diagnosis not present

## 2015-08-30 DIAGNOSIS — I5042 Chronic combined systolic (congestive) and diastolic (congestive) heart failure: Secondary | ICD-10-CM | POA: Diagnosis not present

## 2015-08-31 LAB — THC,MS,WB/SP RFX

## 2015-09-01 ENCOUNTER — Emergency Department (HOSPITAL_COMMUNITY): Payer: Medicare Other

## 2015-09-01 ENCOUNTER — Other Ambulatory Visit: Payer: Self-pay

## 2015-09-01 ENCOUNTER — Encounter (HOSPITAL_COMMUNITY): Payer: Self-pay | Admitting: Emergency Medicine

## 2015-09-01 DIAGNOSIS — E119 Type 2 diabetes mellitus without complications: Secondary | ICD-10-CM | POA: Insufficient documentation

## 2015-09-01 DIAGNOSIS — I150 Renovascular hypertension: Secondary | ICD-10-CM | POA: Diagnosis not present

## 2015-09-01 DIAGNOSIS — R079 Chest pain, unspecified: Secondary | ICD-10-CM | POA: Insufficient documentation

## 2015-09-01 DIAGNOSIS — R51 Headache: Secondary | ICD-10-CM | POA: Insufficient documentation

## 2015-09-01 DIAGNOSIS — Z7951 Long term (current) use of inhaled steroids: Secondary | ICD-10-CM | POA: Insufficient documentation

## 2015-09-01 DIAGNOSIS — Z862 Personal history of diseases of the blood and blood-forming organs and certain disorders involving the immune mechanism: Secondary | ICD-10-CM | POA: Insufficient documentation

## 2015-09-01 DIAGNOSIS — R05 Cough: Secondary | ICD-10-CM | POA: Insufficient documentation

## 2015-09-01 DIAGNOSIS — R0602 Shortness of breath: Secondary | ICD-10-CM | POA: Insufficient documentation

## 2015-09-01 DIAGNOSIS — Z79899 Other long term (current) drug therapy: Secondary | ICD-10-CM | POA: Insufficient documentation

## 2015-09-01 DIAGNOSIS — R072 Precordial pain: Secondary | ICD-10-CM | POA: Diagnosis not present

## 2015-09-01 DIAGNOSIS — Z87891 Personal history of nicotine dependence: Secondary | ICD-10-CM | POA: Insufficient documentation

## 2015-09-01 DIAGNOSIS — I509 Heart failure, unspecified: Secondary | ICD-10-CM | POA: Insufficient documentation

## 2015-09-01 DIAGNOSIS — Z7901 Long term (current) use of anticoagulants: Secondary | ICD-10-CM | POA: Insufficient documentation

## 2015-09-01 DIAGNOSIS — Z992 Dependence on renal dialysis: Secondary | ICD-10-CM | POA: Insufficient documentation

## 2015-09-01 LAB — I-STAT TROPONIN, ED: Troponin i, poc: 0.04 ng/mL (ref 0.00–0.08)

## 2015-09-01 NOTE — ED Notes (Signed)
Patient here with complaint of chest pain and headache. Onset 3 days ago.

## 2015-09-02 ENCOUNTER — Emergency Department (HOSPITAL_COMMUNITY)
Admission: EM | Admit: 2015-09-02 | Discharge: 2015-09-02 | Disposition: A | Discharge status: Home / Self Care | Payer: Medicare Other | Attending: Emergency Medicine | Admitting: Emergency Medicine

## 2015-09-02 DIAGNOSIS — R079 Chest pain, unspecified: Secondary | ICD-10-CM

## 2015-09-02 DIAGNOSIS — I150 Renovascular hypertension: Secondary | ICD-10-CM

## 2015-09-02 LAB — CBC
HCT: 25.9 % — ABNORMAL LOW (ref 39.0–52.0)
HEMOGLOBIN: 8.2 g/dL — AB (ref 13.0–17.0)
MCH: 27.5 pg (ref 26.0–34.0)
MCHC: 31.7 g/dL (ref 30.0–36.0)
MCV: 86.9 fL (ref 78.0–100.0)
Platelets: 216 10*3/uL (ref 150–400)
RBC: 2.98 MIL/uL — AB (ref 4.22–5.81)
RDW: 14.9 % (ref 11.5–15.5)
WBC: 6.4 10*3/uL (ref 4.0–10.5)

## 2015-09-02 LAB — BASIC METABOLIC PANEL
ANION GAP: 16 — AB (ref 5–15)
BUN: 71 mg/dL — ABNORMAL HIGH (ref 6–20)
CALCIUM: 8.8 mg/dL — AB (ref 8.9–10.3)
CO2: 24 mmol/L (ref 22–32)
Chloride: 97 mmol/L — ABNORMAL LOW (ref 101–111)
Creatinine, Ser: 13.83 mg/dL — ABNORMAL HIGH (ref 0.61–1.24)
GFR, EST AFRICAN AMERICAN: 4 mL/min — AB (ref >60)
GFR, EST NON AFRICAN AMERICAN: 4 mL/min — AB (ref >60)
Glucose, Bld: 88 mg/dL (ref 65–99)
POTASSIUM: 5.9 mmol/L — AB (ref 3.5–5.1)
Sodium: 137 mmol/L (ref 135–145)

## 2015-09-02 MED ORDER — MORPHINE SULFATE (PF) 4 MG/ML IV SOLN: 4.0000 mg | Freq: Once | INTRAVENOUS | Status: DC

## 2015-09-02 MED ORDER — ONDANSETRON 4 MG PO TBDP
4.0000 mg | ORAL_TABLET | Freq: Once | ORAL | Status: AC
  Administered 2015-09-02: 4 mg via ORAL
  Filled 2015-09-02: qty 1

## 2015-09-02 MED ORDER — ACETAMINOPHEN 325 MG PO TABS: 325.0000 mg | ORAL_TABLET | Freq: Once | ORAL | Status: DC

## 2015-09-02 MED ORDER — ONDANSETRON 4 MG PO TBDP
ORAL_TABLET | ORAL | Status: AC
Start: 2015-09-02 — End: 2015-09-02
  Administered 2015-09-02: 4 mg
  Filled 2015-09-02: qty 1

## 2015-09-02 MED ORDER — MORPHINE SULFATE (PF) 4 MG/ML IV SOLN
4.0000 mg | Freq: Once | INTRAVENOUS | Status: AC
  Administered 2015-09-02: 4 mg via INTRAMUSCULAR
  Filled 2015-09-02: qty 1

## 2015-09-02 NOTE — ED Provider Notes (Signed)
CSN: [id number]     Arrival date & time 09/01/15  2329 History   First MD Initiated Contact with Patient 09/02/15 0357     Chief Complaint  Patient presents with  . Chest Pain  . Headache     (Consider location/radiation/quality/duration/timing/severity/associated sxs/prior Treatment) HPI Comments: Patient presents with complaint of chest pain that started 3 days ago. He has a history of ESRD-HD after failed transplant in 2006 - dialyzes M, T, Th, Sat in [place], HTN, CHF. He reports SOB. He states he was admitted recently for same as well as dialysis and transferred to [facility] for further care. He was discharged from that facility but reports his pain never resolved. He reports he was on the way back to [place] because he was told if symptoms returned that they would want to re-admit him, but his car broke down on the way there so he came back here. He reports he is taking his meds as prescribed and he has not missed any dialysis.  Patient is a 50 y.o. male presenting with chest pain and headaches. The history is provided by the patient. No language interpreter was used.  Chest Pain Pain location:  Substernal area Pain radiates to the back: no   Associated symptoms: cough, headache and shortness of breath   Associated symptoms: no abdominal pain, no fever, not vomiting and no weakness   Headache Associated symptoms: cough   Associated symptoms: no abdominal pain, no fever, no myalgias, no vomiting and no weakness     Past Medical History  Diagnosis Date  . Hypertension   . Depression   . Complication of anesthesia     itching, sore throat  . Diabetes mellitus without complication ([place])     No history per patient, but remains under history as A1c would not be accurate given on dialysis  . Shortness of breath   . Anxiety   . ESRD (end stage renal disease) ([place])     due to HTN per patient, followed at [facility], s/p failed kidney transplant - dialysis Tue, Th, Sat  . Renal  insufficiency   . CHF (congestive heart failure) ([place])   . Anemia   . Dialysis patient [facility])    Past Surgical History  Procedure Laterality Date  . Kidney receipient  2006    failed and started HD in March 2014  . Capd insertion    . Capd removal    . Left heart catheterization with coronary angiogram N/A 09/02/2014    Procedure: LEFT HEART CATHETERIZATION WITH CORONARY ANGIOGRAM;  Surgeon: [name], MD;  Location: [facility] CATH LAB;  Service: Cardiovascular;  Laterality: N/A;  . Inguinal hernia repair Right 02/14/2015    Procedure: REPAIR INCARCERATED RIGHT INGUINAL HERNIA;  Surgeon: [name], MD;  Location: [place];  Service: General;  Laterality: Right;   History reviewed. No pertinent family history. Social History  Substance Use Topics  . Smoking status: Former Smoker -- 0.00 packs/day for 1 years    Types: Cigarettes  . Smokeless tobacco: Never Used     Comment: quit Jan 2014  . Alcohol Use: No    Review of Systems  Constitutional: Negative for fever.  Respiratory: Positive for cough and shortness of breath.   Cardiovascular: Positive for chest pain.  Gastrointestinal: Negative for vomiting and abdominal pain.  Musculoskeletal: Negative for myalgias.  Neurological: Positive for headaches. Negative for weakness.      Allergies  Butalbital-apap-caffeine; Ferrlecit; Minoxidil; Darvocet; and Propoxyphene  Home Medications  Prior to Admission medications   Medication Sig Start Date End Date Taking? Authorizing Provider  ALPRAZolam [name]) 0.5 MG tablet Take 0.5 mg by mouth 2 (two) times daily as needed for anxiety.    Historical Provider, MD  amLODipine (NORVASC) 10 MG tablet Take 10 mg by mouth daily.    Historical Provider, MD  atorvastatin (LIPITOR) 40 MG tablet Take 1 tablet (40 mg total) by mouth daily at 6 PM. 09/02/14   Barton Dubois, MD  carvedilol (COREG) 25 MG tablet Take 1 tablet (25 mg total) by mouth 2 (two) times daily with a meal. 10/10/14   [name], DO  cinacalcet (SENSIPAR) 30 MG tablet Take 30 mg by mouth daily.    Historical Provider, MD  docusate sodium (COLACE) 100 MG capsule Take 1 capsule (100 mg total) by mouth 2 (two) times daily. 11/19/14   [name], MD  doxercalciferol (HECTOROL) 4 MCG/2ML injection Inject 1.25 mLs (2.5 mcg total) into the vein Every Tuesday,Thursday,and Saturday with dialysis. 10/10/14   [name], DO  gabapentin (NEURONTIN) 100 MG capsule Take 100 mg by mouth 3 (three) times daily. 06/14/15 09/12/15  Historical Provider, MD  hydrALAZINE (APRESOLINE) 50 MG tablet Take 1 tablet (50 mg total) by mouth every 8 (eight) hours. 02/04/15   [name], MD  hydrocerin (EUCERIN) CREA Apply 1 application topically 2 (two) times daily. 02/04/15   [name], MD  HYDROcodone-acetaminophen (NORCO/VICODIN) 5-325 MG tablet Take 1 tablet by mouth 2 (two) times daily as needed for moderate pain or severe pain. 08/20/15   Marijean Heath, NP  HYDROcodone-acetaminophen (NORCO/VICODIN) 5-325 MG tablet Take 1 tablet by mouth every 6 (six) hours as needed. 08/25/15   Olivia Canter Sam, PA-C  isosorbide mononitrate (IMDUR) 60 MG 24 hr tablet Take 60 mg by mouth daily. 06/14/15 09/12/15  Historical Provider, MD  levalbuterol Penne Lash HFA) 45 MCG/ACT inhaler Inhale 2 puffs into the lungs every 6 (six) hours as needed for wheezing or shortness of breath. 11/19/14   [name], MD  lisinopril (PRINIVIL,ZESTRIL) 20 MG tablet Take 1 tablet (20 mg total) by mouth daily. 02/04/15   [name], MD  multivitamin (RENA-VIT) TABS tablet Take 1 tablet by mouth at bedtime. 02/04/15   [name], MD  omeprazole (PRILOSEC) 20 MG capsule Take 20 mg by mouth daily. 07/01/13   Historical Provider, MD  predniSONE (DELTASONE) 5 MG tablet Take 5 mg by mouth daily with breakfast.    Historical Provider, MD  sevelamer carbonate (RENVELA) 800 MG tablet Take 800-1,600 mg by mouth 3 (three) times daily with meals. 2 tabs three times daily with  meals, and 1 tablet with snacks    Historical Provider, MD  warfarin (COUMADIN) 6 MG tablet Take 1 tablet (6 mg total) by mouth daily at 6 PM. Take daily until adjusted per MD 02/17/15   Eugenie Filler, MD   BP 180/110 mmHg  Pulse 68  Temp(Src) 98.3 F (36.8 C) (Oral)  Resp 22  SpO2 100% Physical Exam  Constitutional: He is oriented to person, place, and time. He appears well-developed and well-nourished.  HENT:  Head: Normocephalic.  Neck: Normal range of motion. Neck supple.  Cardiovascular: Normal rate and regular rhythm.   Pulmonary/Chest: Effort normal and breath sounds normal.  Dialysis port in right chest, site unremarkable.   Abdominal: Soft. Bowel sounds are normal. There is no tenderness. There is no rebound and no guarding.  Musculoskeletal: Normal range of  motion.  Neurological: He is alert and oriented to person, place, and time.  Skin: Skin is warm and dry. No rash noted.  Psychiatric: He has a normal mood and affect.  Nursing note and vitals reviewed.   ED Course  Procedures (including critical care time) Labs Review Labs Reviewed  BASIC METABOLIC PANEL - Abnormal; Notable for the following:    Potassium 5.9 (*)    Chloride 97 (*)    BUN 71 (*)    Creatinine, Ser 13.83 (*)    Calcium 8.8 (*)    GFR calc non Af Amer 4 (*)    GFR calc Af Amer 4 (*)    Anion gap 16 (*)    All other components within normal limits  CBC - Abnormal; Notable for the following:    RBC 2.98 (*)    Hemoglobin 8.2 (*)    HCT 25.9 (*)    All other components within normal limits  I-STAT TROPOININ, ED   Dg Chest 2 View  09/02/2015  CLINICAL DATA:  50 year old male with shortness of breath and chest pain EXAM: CHEST  2 VIEW COMPARISON:  Radiograph dated 08/25/2015 FINDINGS: 50 year old catheter is noted with tip over the right atrium in stable positioning. There is stable cardiomegaly with mild vascular congestion. No focal consolidation, pleural effusion, or pneumothorax. No acute  osseous pathology. IMPRESSION: No interval change. Electronically Signed   By: Anner Crete M.D.   On: 09/02/2015 00:02   Dg Chest 2 View  08/18/2015  CLINICAL DATA:  Chest pain and shortness of breath while at dialysis today. EXAM: CHEST  2 VIEW COMPARISON:  08/10/2015. FINDINGS: The cardio pericardial silhouette is enlarged. There is pulmonary vascular congestion without overt pulmonary edema. The lungs are clear wiithout focal pneumonia, pneumothorax or pleural effusion. Right IJ dialysis catheter tip projects at the level of the mid right atrium. The visualized bony structures of the thorax are intact. IMPRESSION: Cardiomegaly with vascular congestion. Electronically Signed   By: Misty Stanley M.D.   On: 08/18/2015 15:57   Dg Chest 2 View  08/10/2015  CLINICAL DATA:  Shortness of breath.  Chronic renal failure EXAM: CHEST  2 VIEW COMPARISON:  July 17, 2015 FINDINGS: There is cardiomegaly with mild pulmonary venous hypertension. There is a minimal right effusion. No edema or consolidation. Central catheter tip is in the right atrium slightly beyond the cavoatrial junction. No pneumothorax. No adenopathy. IMPRESSION: Persistent pulmonary vascular congestion. No frank edema or consolidation. Minimal right effusion. No change and central catheter position. No pneumothorax. Electronically Signed   By: Lowella Grip III M.D.   On: 08/10/2015 09:16   Dg Chest Port 1 View  08/19/2015  CLINICAL DATA:  50 year old male with shortness of breath. End-stage renal disease. EXAM: PORTABLE CHEST 1 VIEW COMPARISON:  08/18/2015 FINDINGS: Cardiomegaly and pulmonary vascular congestion noted. Slightly increased left retrocardiac opacity may represent atelectasis. Trace bilateral pleural effusions are present. There is no evidence of pneumothorax. A right IJ central venous catheter is present with tip overlying the upper right atrium. IMPRESSION: Little significant change except for possible development of left  lower lung atelectasis. Cardiomegaly, pulmonary vascular congestion and trace pleural effusions again noted. Electronically Signed   By: Margarette Canada M.D.   On: 08/19/2015 07:07   Dg Abd Acute W/chest  08/25/2015  CLINICAL DATA:  BILATERAL groin pain greater on RIGHT, recent hernia surgery 4 days ago, hypertension, diabetes mellitus, end- stage renal disease on dialysis, CHF, former smoker EXAM: DG ABDOMEN ACUTE  W/ 1V CHEST COMPARISON:  08/19/2015 chest radiograph, abdominal radiograph 02/11/2015 FINDINGS: RIGHT jugular dual-lumen central venous catheter with tip projecting over high RIGHT atrium. Enlargement of cardiac silhouette with pulmonary vascular congestion. Central peribronchial thickening with question minimal perihilar edema asymmetric on RIGHT. Remaining lungs clear. No definite pleural effusion or pneumothorax. Normal bowel gas pattern with scattered stool and gas throughout colon. No bowel dilatation, bowel wall thickening, or free intraperitoneal air. Osseous structures unremarkable. Scattered atherosclerotic calcifications. No definite urinary tract calcification. IMPRESSION: Enlargement of cardiac silhouette with pulmonary vascular congestion and question minimal asymmetric RIGHT perihilar edema. No acute abdominal findings. Electronically Signed   By: Lavonia Dana M.D.   On: 08/25/2015 14:37   Results for orders placed or performed during the hospital encounter of 34/74/25  Basic metabolic panel  Result Value Ref Range   Sodium 137 135 - 145 mmol/L   Potassium 5.9 (H) 3.5 - 5.1 mmol/L   Chloride 97 (L) 101 - 111 mmol/L   CO2 24 22 - 32 mmol/L   Glucose, Bld 88 65 - 99 mg/dL   BUN 71 (H) 6 - 20 mg/dL   Creatinine, Ser 13.83 (H) 0.61 - 1.24 mg/dL   Calcium 8.8 (L) 8.9 - 10.3 mg/dL   GFR calc non Af Amer 4 (L) >60 mL/min   GFR calc Af Amer 4 (L) >60 mL/min   Anion gap 16 (H) 5 - 15  CBC  Result Value Ref Range   WBC 6.4 4.0 - 10.5 K/uL   RBC 2.98 (L) 4.22 - 5.81 MIL/uL   Hemoglobin  8.2 (L) 13.0 - 17.0 g/dL   HCT 25.9 (L) 39.0 - 52.0 %   MCV 86.9 78.0 - 100.0 fL   MCH 27.5 26.0 - 34.0 pg   MCHC 31.7 30.0 - 36.0 g/dL   RDW 14.9 11.5 - 15.5 %   Platelets 216 150 - 400 K/uL  I-stat troponin, ED (not at [facility])  Result Value Ref Range   Troponin i, poc 0.04 0.00 - 0.08 ng/mL   Comment 3            Imaging Review Dg Chest 2 View  09/02/2015  CLINICAL DATA:  50 year old male with shortness of breath and chest pain EXAM: CHEST  2 VIEW COMPARISON:  Radiograph dated 08/25/2015 FINDINGS: 50 year old catheter is noted with tip over the right atrium in stable positioning. There is stable cardiomegaly with mild vascular congestion. No focal consolidation, pleural effusion, or pneumothorax. No acute osseous pathology. IMPRESSION: No interval change. Electronically Signed   By: Anner Crete M.D.   On: 09/02/2015 00:02   I have personally reviewed and evaluated these images and lab results as part of my medical decision-making.   EKG Interpretation None      MDM   Final diagnoses:  None    1. Chest pain 2. Hypertension 3. Chronic noncompliance  The patient's chart is reviewed. He was seen on 1/27 and admitted for critical hyperkalemia with EKG changes, dischargd home on 1/29 after dialysis with resolution of hyperkalemia and cardiac concerns. Seen in [place] on 08/30/15, discharged. Seen at [facility] on 08/25/15 for groin pain and discharged home.   The patient has a history of chronic noncompliance with medications and with dialysis. See and evaluated tonight with labs showing mild hyperkalemia but no EKG changes, no edema or evidence of fluid overload. He is scheduled to dialyze this afternoon in [facility] and patient is aware of his schedule. He can be discharged home to  go to his dialysis appointment as scheduled.   The patient is reluctant to be discharged. He does not feel he has been taken care of. Dr. Leonides Schanz in to see patient. Agrees he is stable for discharge. Taxi  voucher provided so that patient can be taken to dialysis as scheduled today. He is given pain medication for headache.   Charlann Lange, PA-C 09/05/15 2018

## 2015-09-02 NOTE — ED Notes (Addendum)
Pt verbalized understanding of d/c instructions and as no further questions. Pt stable and NAD. Pt no longer vomiting since second admin of zofran odt. Pt given cab voucher to get to [facility] on [address]

## 2015-09-02 NOTE — ED Notes (Signed)
[employer] approved cab voucher for pt to make it to dialysis.

## 2015-09-02 NOTE — ED Provider Notes (Signed)
Medical screening examination/treatment/procedure(s) were conducted as a shared visit with non-physician practitioner(s) and myself.  I personally evaluated the patient during the encounter.   EKG Interpretation   Date/Time:  Friday September 01 2015 23:39:23 EST Ventricular Rate:  74 PR Interval:  182 QRS Duration: 98 QT Interval:  424 QTC Calculation: 470 R Axis:   19 Text Interpretation:  Normal sinus rhythm ST \T\ T wave abnormality,  consider lateral ischemia Prolonged QT Abnormal ECG No significant change  since last tracing Confirmed by Klea Nall,  DO, Hillary Schwegler [phone]) on 09/02/2015  5:44:52 AM      Pt is a 50 y.o. male with chronic chest wall pain, end-stage disease on hemodialysis this due to be dialyzed this morning who presents emergency department nasal congestion, dry cough and chest wall tenderness. Also complaining of headache and nausea. Has present to the emergency department many times in the past with these similar complaints. Exam is unremarkable. Chest pain is reproducible with palpation of his chest wall and with movement in the bed. Labs show very mild hyperkalemia with a potassium of 5.9 with no EKG changes compared to prior. Troponin is negative. Chest x-ray is clear. He is chronically hypertensive. He does not appear significant volume overloaded. I feel he is safe to be discharged home and to his dialysis center today for non-emergent dialysis. I do not feel he needs further emergent workup.  [place], DO 09/02/15 [phone]

## 2015-09-02 NOTE — ED Notes (Signed)
Pt vomited zofran odt up, Dr Leonides Schanz notified and ordered another zofran odt.

## 2015-09-02 NOTE — Discharge Instructions (Signed)
Upper Respiratory Infection, Adult Most upper respiratory infections (URIs) are a viral infection of the air passages leading to the lungs. A URI affects the nose, throat, and upper air passages. The most common type of URI is nasopharyngitis and is typically referred to as "the common cold." URIs run their course and usually go away on their own. Most of the time, a URI does not require medical attention, but sometimes a bacterial infection in the upper airways can follow a viral infection. This is called a secondary infection. Sinus and middle ear infections are common types of secondary upper respiratory infections. Bacterial pneumonia can also complicate a URI. A URI can worsen asthma and chronic obstructive pulmonary disease (COPD). Sometimes, these complications can require emergency medical care and may be life threatening.  CAUSES Almost all URIs are caused by viruses. A virus is a type of germ and can spread from one person to another.  RISKS FACTORS You may be at risk for a URI if:   You smoke.   You have chronic heart or lung disease.  You have a weakened defense (immune) system.   You are very young or very old.   You have nasal allergies or asthma.  You work in crowded or poorly ventilated areas.  You work in health care facilities or schools. SIGNS AND SYMPTOMS  Symptoms typically develop 2-3 days after you come in contact with a cold virus. Most viral URIs last 7-10 days. However, viral URIs from the influenza virus (flu virus) can last 14-18 days and are typically more severe. Symptoms may include:   Runny or stuffy (congested) nose.   Sneezing.   Cough.   Sore throat.   Headache.   Fatigue.   Fever.   Loss of appetite.   Pain in your forehead, behind your eyes, and over your cheekbones (sinus pain).  Muscle aches.  DIAGNOSIS  Your health care provider may diagnose a URI by:  Physical exam.  Tests to check that your symptoms are not due to  another condition such as:  Strep throat.  Sinusitis.  Pneumonia.  Asthma. TREATMENT  A URI goes away on its own with time. It cannot be cured with medicines, but medicines may be prescribed or recommended to relieve symptoms. Medicines may help:  Reduce your fever.  Reduce your cough.  Relieve nasal congestion. HOME CARE INSTRUCTIONS   Take medicines only as directed by your health care provider.   Gargle warm saltwater or take cough drops to comfort your throat as directed by your health care provider.  Use a warm mist humidifier or inhale steam from a shower to increase air moisture. This may make it easier to breathe.  Drink enough fluid to keep your urine clear or pale yellow.   Eat soups and other clear broths and maintain good nutrition.   Rest as needed.   Return to work when your temperature has returned to normal or as your health care provider advises. You may need to stay home longer to avoid infecting others. You can also use a face mask and careful hand washing to prevent spread of the virus.  Increase the usage of your inhaler if you have asthma.   Do not use any tobacco products, including cigarettes, chewing tobacco, or electronic cigarettes. If you need help quitting, ask your health care provider. PREVENTION  The best way to protect yourself from getting a cold is to practice good hygiene.   Avoid oral or hand contact with people with cold  symptoms.   Wash your hands often if contact occurs.  There is no clear evidence that vitamin C, vitamin E, echinacea, or exercise reduces the chance of developing a cold. However, it is always recommended to get plenty of rest, exercise, and practice good nutrition.  SEEK MEDICAL CARE IF:   You are getting worse rather than better.   Your symptoms are not controlled by medicine.   You have chills.  You have worsening shortness of breath.  You have brown or red mucus.  You have yellow or brown nasal  discharge.  You have pain in your face, especially when you bend forward.  You have a fever.  You have swollen neck glands.  You have pain while swallowing.  You have white areas in the back of your throat. SEEK IMMEDIATE MEDICAL CARE IF:   You have severe or persistent:  Headache.  Ear pain.  Sinus pain.  Chest pain.  You have chronic lung disease and any of the following:  Wheezing.  Prolonged cough.  Coughing up blood.  A change in your usual mucus.  You have a stiff neck.  You have changes in your:  Vision.  Hearing.  Thinking.  Mood. MAKE SURE YOU:   Understand these instructions.  Will watch your condition.  Will get help right away if you are not doing well or get worse.   This information is not intended to replace advice given to you by your health care provider. Make sure you discuss any questions you have with your health care provider.   Document Released: 01/01/2001 Document Revised: 11/22/2014 Document Reviewed: 10/13/2013 Elsevier Interactive Patient Education 2016 Elsevier Inc. Nonspecific Chest Pain  Chest pain can be caused by many different conditions. There is always a chance that your pain could be related to something serious, such as a heart attack or a blood clot in your lungs. Chest pain can also be caused by conditions that are not life-threatening. If you have chest pain, it is very important to follow up with your health care provider. CAUSES  Chest pain can be caused by:  Heartburn.  Pneumonia or bronchitis.  Anxiety or stress.  Inflammation around your heart (pericarditis) or lung (pleuritis or pleurisy).  A blood clot in your lung.  A collapsed lung (pneumothorax). It can develop suddenly on its own (spontaneous pneumothorax) or from trauma to the chest.  Shingles infection (varicella-zoster virus).  Heart attack.  Damage to the bones, muscles, and cartilage that make up your chest wall. This can  include:  Bruised bones due to injury.  Strained muscles or cartilage due to frequent or repeated coughing or overwork.  Fracture to one or more ribs.  Sore cartilage due to inflammation (costochondritis). RISK FACTORS  Risk factors for chest pain may include:  Activities that increase your risk for trauma or injury to your chest.  Respiratory infections or conditions that cause frequent coughing.  Medical conditions or overeating that can cause heartburn.  Heart disease or family history of heart disease.  Conditions or health behaviors that increase your risk of developing a blood clot.  Having had chicken pox (varicella zoster). SIGNS AND SYMPTOMS Chest pain can feel like:  Burning or tingling on the surface of your chest or deep in your chest.  Crushing, pressure, aching, or squeezing pain.  Dull or sharp pain that is worse when you move, cough, or take a deep breath.  Pain that is also felt in your back, neck, shoulder, or arm, or pain  that spreads to any of these areas. Your chest pain may come and go, or it may stay constant. DIAGNOSIS Lab tests or other studies may be needed to find the cause of your pain. Your health care provider may have you take a test called an ambulatory ECG (electrocardiogram). An ECG records your heartbeat patterns at the time the test is performed. You may also have other tests, such as:  Transthoracic echocardiogram (TTE). During echocardiography, sound waves are used to create a picture of all of the heart structures and to look at how blood flows through your heart.  Transesophageal echocardiogram (TEE).This is a more advanced imaging test that obtains images from inside your body. It allows your health care provider to see your heart in finer detail.  Cardiac monitoring. This allows your health care provider to monitor your heart rate and rhythm in real time.  Holter monitor. This is a portable device that records your heartbeat and  can help to diagnose abnormal heartbeats. It allows your health care provider to track your heart activity for several days, if needed.  Stress tests. These can be done through exercise or by taking medicine that makes your heart beat more quickly.  Blood tests.  Imaging tests. TREATMENT  Your treatment depends on what is causing your chest pain. Treatment may include:  Medicines. These may include:  Acid blockers for heartburn.  Anti-inflammatory medicine.  Pain medicine for inflammatory conditions.  Antibiotic medicine, if an infection is present.  Medicines to dissolve blood clots.  Medicines to treat coronary artery disease.  Supportive care for conditions that do not require medicines. This may include:  Resting.  Applying heat or cold packs to injured areas.  Limiting activities until pain decreases. HOME CARE INSTRUCTIONS  If you were prescribed an antibiotic medicine, finish it all even if you start to feel better.  Avoid any activities that bring on chest pain.  Do not use any tobacco products, including cigarettes, chewing tobacco, or electronic cigarettes. If you need help quitting, ask your health care provider.  Do not drink alcohol.  Take medicines only as directed by your health care provider.  Keep all follow-up visits as directed by your health care provider. This is important. This includes any further testing if your chest pain does not go away.  If heartburn is the cause for your chest pain, you may be told to keep your head raised (elevated) while sleeping. This reduces the chance that acid will go from your stomach into your esophagus.  Make lifestyle changes as directed by your health care provider. These may include:  Getting regular exercise. Ask your health care provider to suggest some activities that are safe for you.  Eating a heart-healthy diet. A registered dietitian can help you to learn healthy eating options.  Maintaining a  healthy weight.  Managing diabetes, if necessary.  Reducing stress. SEEK MEDICAL CARE IF:  Your chest pain does not go away after treatment.  You have a rash with blisters on your chest.  You have a fever. SEEK IMMEDIATE MEDICAL CARE IF:   Your chest pain is worse.  You have an increasing cough, or you cough up blood.  You have severe abdominal pain.  You have severe weakness.  You faint.  You have chills.  You have sudden, unexplained chest discomfort.  You have sudden, unexplained discomfort in your arms, back, neck, or jaw.  You have shortness of breath at any time.  You suddenly start to sweat, or your  skin gets clammy.  You feel nauseous or you vomit.  You suddenly feel light-headed or dizzy.  Your heart begins to beat quickly, or it feels like it is skipping beats. These symptoms may represent a serious problem that is an emergency. Do not wait to see if the symptoms will go away. Get medical help right away. Call your local emergency services (911 in the [place] Do not drive yourself to the hospital.   This information is not intended to replace advice given to you by your health care provider. Make sure you discuss any questions you have with your health care provider.   Document Released: 04/17/2005 Document Revised: 07/29/2014 Document Reviewed: 02/11/2014 Elsevier Interactive Patient Education [employer].

## 2015-09-05 DIAGNOSIS — K219 Gastro-esophageal reflux disease without esophagitis: Secondary | ICD-10-CM | POA: Diagnosis not present

## 2015-09-05 DIAGNOSIS — Z992 Dependence on renal dialysis: Secondary | ICD-10-CM | POA: Diagnosis not present

## 2015-09-05 DIAGNOSIS — Z7901 Long term (current) use of anticoagulants: Secondary | ICD-10-CM | POA: Diagnosis not present

## 2015-09-05 DIAGNOSIS — N186 End stage renal disease: Secondary | ICD-10-CM | POA: Diagnosis not present

## 2015-09-05 DIAGNOSIS — G4733 Obstructive sleep apnea (adult) (pediatric): Secondary | ICD-10-CM | POA: Diagnosis not present

## 2015-09-05 DIAGNOSIS — Z940 Kidney transplant status: Secondary | ICD-10-CM | POA: Diagnosis not present

## 2015-09-05 DIAGNOSIS — J90 Pleural effusion, not elsewhere classified: Secondary | ICD-10-CM | POA: Diagnosis not present

## 2015-09-05 DIAGNOSIS — I517 Cardiomegaly: Secondary | ICD-10-CM | POA: Diagnosis not present

## 2015-09-05 DIAGNOSIS — R001 Bradycardia, unspecified: Secondary | ICD-10-CM | POA: Diagnosis not present

## 2015-09-05 DIAGNOSIS — I491 Atrial premature depolarization: Secondary | ICD-10-CM | POA: Diagnosis not present

## 2015-09-05 DIAGNOSIS — I509 Heart failure, unspecified: Secondary | ICD-10-CM | POA: Diagnosis not present

## 2015-09-05 DIAGNOSIS — I444 Left anterior fascicular block: Secondary | ICD-10-CM | POA: Diagnosis not present

## 2015-09-05 DIAGNOSIS — R079 Chest pain, unspecified: Secondary | ICD-10-CM | POA: Diagnosis not present

## 2015-09-05 DIAGNOSIS — Z87891 Personal history of nicotine dependence: Secondary | ICD-10-CM | POA: Diagnosis not present

## 2015-09-05 DIAGNOSIS — I120 Hypertensive chronic kidney disease with stage 5 chronic kidney disease or end stage renal disease: Secondary | ICD-10-CM | POA: Diagnosis not present

## 2015-09-05 DIAGNOSIS — R0602 Shortness of breath: Secondary | ICD-10-CM | POA: Diagnosis not present

## 2015-09-05 DIAGNOSIS — R51 Headache: Secondary | ICD-10-CM | POA: Diagnosis not present

## 2015-09-05 DIAGNOSIS — R072 Precordial pain: Secondary | ICD-10-CM | POA: Diagnosis not present

## 2015-09-05 DIAGNOSIS — E875 Hyperkalemia: Secondary | ICD-10-CM | POA: Diagnosis not present

## 2015-09-06 DIAGNOSIS — Z79899 Other long term (current) drug therapy: Secondary | ICD-10-CM | POA: Diagnosis not present

## 2015-09-06 DIAGNOSIS — R9431 Abnormal electrocardiogram [ECG] [EKG]: Secondary | ICD-10-CM | POA: Diagnosis not present

## 2015-09-06 DIAGNOSIS — R001 Bradycardia, unspecified: Secondary | ICD-10-CM | POA: Diagnosis not present

## 2015-09-06 DIAGNOSIS — I491 Atrial premature depolarization: Secondary | ICD-10-CM | POA: Diagnosis not present

## 2015-09-06 DIAGNOSIS — Z992 Dependence on renal dialysis: Secondary | ICD-10-CM | POA: Diagnosis not present

## 2015-09-06 DIAGNOSIS — I120 Hypertensive chronic kidney disease with stage 5 chronic kidney disease or end stage renal disease: Secondary | ICD-10-CM | POA: Diagnosis not present

## 2015-09-06 DIAGNOSIS — I517 Cardiomegaly: Secondary | ICD-10-CM | POA: Diagnosis not present

## 2015-09-06 DIAGNOSIS — N186 End stage renal disease: Secondary | ICD-10-CM | POA: Diagnosis not present

## 2015-09-06 DIAGNOSIS — I444 Left anterior fascicular block: Secondary | ICD-10-CM | POA: Diagnosis not present

## 2015-09-07 ENCOUNTER — Inpatient Hospital Stay (HOSPITAL_COMMUNITY)
Admission: EM | Admit: 2015-09-07 | Discharge: 2015-09-11 | DRG: 291 | Disposition: A | Discharge status: Home / Self Care | Payer: Medicare Other | Attending: Internal Medicine | Admitting: Internal Medicine

## 2015-09-07 ENCOUNTER — Encounter (HOSPITAL_COMMUNITY): Payer: Self-pay | Admitting: Emergency Medicine

## 2015-09-07 DIAGNOSIS — Z86711 Personal history of pulmonary embolism: Secondary | ICD-10-CM

## 2015-09-07 DIAGNOSIS — Z8249 Family history of ischemic heart disease and other diseases of the circulatory system: Secondary | ICD-10-CM

## 2015-09-07 DIAGNOSIS — E1165 Type 2 diabetes mellitus with hyperglycemia: Secondary | ICD-10-CM | POA: Diagnosis present

## 2015-09-07 DIAGNOSIS — R7989 Other specified abnormal findings of blood chemistry: Secondary | ICD-10-CM

## 2015-09-07 DIAGNOSIS — I132 Hypertensive heart and chronic kidney disease with heart failure and with stage 5 chronic kidney disease, or end stage renal disease: Principal | ICD-10-CM | POA: Diagnosis present

## 2015-09-07 DIAGNOSIS — Z87891 Personal history of nicotine dependence: Secondary | ICD-10-CM

## 2015-09-07 DIAGNOSIS — E1129 Type 2 diabetes mellitus with other diabetic kidney complication: Secondary | ICD-10-CM | POA: Diagnosis present

## 2015-09-07 DIAGNOSIS — N189 Chronic kidney disease, unspecified: Secondary | ICD-10-CM

## 2015-09-07 DIAGNOSIS — E875 Hyperkalemia: Secondary | ICD-10-CM

## 2015-09-07 DIAGNOSIS — R079 Chest pain, unspecified: Secondary | ICD-10-CM | POA: Diagnosis not present

## 2015-09-07 DIAGNOSIS — F1110 Opioid abuse, uncomplicated: Secondary | ICD-10-CM | POA: Diagnosis present

## 2015-09-07 DIAGNOSIS — Z7952 Long term (current) use of systemic steroids: Secondary | ICD-10-CM

## 2015-09-07 DIAGNOSIS — N186 End stage renal disease: Secondary | ICD-10-CM

## 2015-09-07 DIAGNOSIS — I160 Hypertensive urgency: Secondary | ICD-10-CM | POA: Diagnosis present

## 2015-09-07 DIAGNOSIS — Y830 Surgical operation with transplant of whole organ as the cause of abnormal reaction of the patient, or of later complication, without mention of misadventure at the time of the procedure: Secondary | ICD-10-CM | POA: Diagnosis present

## 2015-09-07 DIAGNOSIS — R05 Cough: Secondary | ICD-10-CM | POA: Diagnosis not present

## 2015-09-07 DIAGNOSIS — R791 Abnormal coagulation profile: Secondary | ICD-10-CM

## 2015-09-07 DIAGNOSIS — R001 Bradycardia, unspecified: Secondary | ICD-10-CM | POA: Diagnosis not present

## 2015-09-07 DIAGNOSIS — D631 Anemia in chronic kidney disease: Secondary | ICD-10-CM

## 2015-09-07 DIAGNOSIS — Z992 Dependence on renal dialysis: Secondary | ICD-10-CM

## 2015-09-07 DIAGNOSIS — G8929 Other chronic pain: Secondary | ICD-10-CM | POA: Diagnosis present

## 2015-09-07 DIAGNOSIS — Z9119 Patient's noncompliance with other medical treatment and regimen: Secondary | ICD-10-CM

## 2015-09-07 DIAGNOSIS — R0789 Other chest pain: Secondary | ICD-10-CM

## 2015-09-07 DIAGNOSIS — IMO0002 Reserved for concepts with insufficient information to code with codable children: Secondary | ICD-10-CM | POA: Diagnosis present

## 2015-09-07 DIAGNOSIS — T8612 Kidney transplant failure: Secondary | ICD-10-CM | POA: Diagnosis present

## 2015-09-07 DIAGNOSIS — I120 Hypertensive chronic kidney disease with stage 5 chronic kidney disease or end stage renal disease: Secondary | ICD-10-CM | POA: Diagnosis not present

## 2015-09-07 DIAGNOSIS — E1122 Type 2 diabetes mellitus with diabetic chronic kidney disease: Secondary | ICD-10-CM | POA: Diagnosis present

## 2015-09-07 DIAGNOSIS — I480 Paroxysmal atrial fibrillation: Secondary | ICD-10-CM | POA: Diagnosis present

## 2015-09-07 DIAGNOSIS — R778 Other specified abnormalities of plasma proteins: Secondary | ICD-10-CM

## 2015-09-07 DIAGNOSIS — J9601 Acute respiratory failure with hypoxia: Secondary | ICD-10-CM | POA: Diagnosis present

## 2015-09-07 DIAGNOSIS — Z86718 Personal history of other venous thrombosis and embolism: Secondary | ICD-10-CM

## 2015-09-07 DIAGNOSIS — I5023 Acute on chronic systolic (congestive) heart failure: Secondary | ICD-10-CM | POA: Diagnosis present

## 2015-09-07 DIAGNOSIS — Z7901 Long term (current) use of anticoagulants: Secondary | ICD-10-CM

## 2015-09-07 DIAGNOSIS — Z940 Kidney transplant status: Secondary | ICD-10-CM

## 2015-09-07 NOTE — ED Notes (Signed)
Pt presents from car via [facility] for centralized CP with no radiation rated as 9/10 and sharp; pt reports he was on his way to hospital when he called 911; pt reports CP worse with movement and deep breathing; pt also reports he needs hemodialysis- last treatment was x 10 days ago (tu, th, sat) Aug 29, 2015 pt given 369m ASA by EMS enroute to hospital

## 2015-09-08 ENCOUNTER — Emergency Department (HOSPITAL_COMMUNITY): Payer: Medicare Other

## 2015-09-08 ENCOUNTER — Encounter (HOSPITAL_COMMUNITY): Payer: Self-pay | Admitting: Internal Medicine

## 2015-09-08 DIAGNOSIS — J9601 Acute respiratory failure with hypoxia: Secondary | ICD-10-CM

## 2015-09-08 DIAGNOSIS — I160 Hypertensive urgency: Secondary | ICD-10-CM

## 2015-09-08 DIAGNOSIS — Z86711 Personal history of pulmonary embolism: Secondary | ICD-10-CM | POA: Diagnosis not present

## 2015-09-08 DIAGNOSIS — N186 End stage renal disease: Secondary | ICD-10-CM | POA: Diagnosis not present

## 2015-09-08 DIAGNOSIS — E875 Hyperkalemia: Secondary | ICD-10-CM

## 2015-09-08 DIAGNOSIS — G8929 Other chronic pain: Secondary | ICD-10-CM | POA: Diagnosis present

## 2015-09-08 DIAGNOSIS — Z7901 Long term (current) use of anticoagulants: Secondary | ICD-10-CM | POA: Diagnosis not present

## 2015-09-08 DIAGNOSIS — Z9119 Patient's noncompliance with other medical treatment and regimen: Secondary | ICD-10-CM | POA: Diagnosis not present

## 2015-09-08 DIAGNOSIS — R079 Chest pain, unspecified: Secondary | ICD-10-CM | POA: Diagnosis present

## 2015-09-08 DIAGNOSIS — E1122 Type 2 diabetes mellitus with diabetic chronic kidney disease: Secondary | ICD-10-CM | POA: Diagnosis not present

## 2015-09-08 DIAGNOSIS — Z992 Dependence on renal dialysis: Secondary | ICD-10-CM | POA: Diagnosis not present

## 2015-09-08 DIAGNOSIS — I5023 Acute on chronic systolic (congestive) heart failure: Secondary | ICD-10-CM | POA: Diagnosis not present

## 2015-09-08 DIAGNOSIS — Z7952 Long term (current) use of systemic steroids: Secondary | ICD-10-CM | POA: Diagnosis not present

## 2015-09-08 DIAGNOSIS — R001 Bradycardia, unspecified: Secondary | ICD-10-CM | POA: Diagnosis not present

## 2015-09-08 DIAGNOSIS — R05 Cough: Secondary | ICD-10-CM | POA: Diagnosis not present

## 2015-09-08 DIAGNOSIS — R0789 Other chest pain: Secondary | ICD-10-CM

## 2015-09-08 DIAGNOSIS — Z87891 Personal history of nicotine dependence: Secondary | ICD-10-CM | POA: Diagnosis not present

## 2015-09-08 DIAGNOSIS — Z940 Kidney transplant status: Secondary | ICD-10-CM | POA: Diagnosis not present

## 2015-09-08 DIAGNOSIS — Z8249 Family history of ischemic heart disease and other diseases of the circulatory system: Secondary | ICD-10-CM | POA: Diagnosis not present

## 2015-09-08 DIAGNOSIS — F1110 Opioid abuse, uncomplicated: Secondary | ICD-10-CM | POA: Diagnosis present

## 2015-09-08 DIAGNOSIS — D631 Anemia in chronic kidney disease: Secondary | ICD-10-CM | POA: Diagnosis present

## 2015-09-08 DIAGNOSIS — I132 Hypertensive heart and chronic kidney disease with heart failure and with stage 5 chronic kidney disease, or end stage renal disease: Secondary | ICD-10-CM | POA: Diagnosis not present

## 2015-09-08 DIAGNOSIS — Z86718 Personal history of other venous thrombosis and embolism: Secondary | ICD-10-CM | POA: Diagnosis not present

## 2015-09-08 DIAGNOSIS — E1165 Type 2 diabetes mellitus with hyperglycemia: Secondary | ICD-10-CM | POA: Diagnosis present

## 2015-09-08 DIAGNOSIS — I480 Paroxysmal atrial fibrillation: Secondary | ICD-10-CM | POA: Diagnosis present

## 2015-09-08 DIAGNOSIS — Y830 Surgical operation with transplant of whole organ as the cause of abnormal reaction of the patient, or of later complication, without mention of misadventure at the time of the procedure: Secondary | ICD-10-CM | POA: Diagnosis present

## 2015-09-08 DIAGNOSIS — T8612 Kidney transplant failure: Secondary | ICD-10-CM | POA: Diagnosis present

## 2015-09-08 DIAGNOSIS — I120 Hypertensive chronic kidney disease with stage 5 chronic kidney disease or end stage renal disease: Secondary | ICD-10-CM | POA: Diagnosis not present

## 2015-09-08 HISTORY — DX: Chest pain, unspecified: R07.9

## 2015-09-08 HISTORY — DX: Acute respiratory failure with hypoxia: J96.01

## 2015-09-08 LAB — PROTIME-INR
INR: 1.53 — ABNORMAL HIGH (ref 0.00–1.49)
INR: 1.51 — ABNORMAL HIGH (ref 0.00–1.49)
Prothrombin Time: 18.4 seconds — ABNORMAL HIGH (ref 11.6–15.2)
Prothrombin Time: 18.3 seconds — ABNORMAL HIGH (ref 11.6–15.2)

## 2015-09-08 LAB — GLUCOSE, CAPILLARY
GLUCOSE-CAPILLARY: 99 mg/dL (ref 65–99)
GLUCOSE-CAPILLARY: 95 mg/dL (ref 65–99)
Glucose-Capillary: 135 mg/dL — ABNORMAL HIGH (ref 65–99)

## 2015-09-08 LAB — CBC
HCT: 23.2 % — ABNORMAL LOW (ref 39.0–52.0)
HCT: 23.3 % — ABNORMAL LOW (ref 39.0–52.0)
HCT: 25.5 % — ABNORMAL LOW (ref 39.0–52.0)
HEMOGLOBIN: 7.8 g/dL — AB (ref 13.0–17.0)
Hemoglobin: 7.6 g/dL — ABNORMAL LOW (ref 13.0–17.0)
Hemoglobin: 8.2 g/dL — ABNORMAL LOW (ref 13.0–17.0)
MCH: 27.5 pg (ref 26.0–34.0)
MCH: 27.9 pg (ref 26.0–34.0)
MCH: 28.7 pg (ref 26.0–34.0)
MCHC: 32.2 g/dL (ref 30.0–36.0)
MCHC: 32.6 g/dL (ref 30.0–36.0)
MCHC: 33.6 g/dL (ref 30.0–36.0)
MCV: 85.3 fL (ref 78.0–100.0)
MCV: 85.6 fL (ref 78.0–100.0)
MCV: 85.7 fL (ref 78.0–100.0)
PLATELETS: 170 10*3/uL (ref 150–400)
PLATELETS: 174 10*3/uL (ref 150–400)
Platelets: 168 10*3/uL (ref 150–400)
RBC: 2.72 MIL/uL — AB (ref 4.22–5.81)
RBC: 2.72 MIL/uL — ABNORMAL LOW (ref 4.22–5.81)
RBC: 2.98 MIL/uL — ABNORMAL LOW (ref 4.22–5.81)
RDW: 14.8 % (ref 11.5–15.5)
RDW: 14.9 % (ref 11.5–15.5)
RDW: 15 % (ref 11.5–15.5)
WBC: 4.5 10*3/uL (ref 4.0–10.5)
WBC: 4.7 10*3/uL (ref 4.0–10.5)
WBC: 5.8 10*3/uL (ref 4.0–10.5)

## 2015-09-08 LAB — BASIC METABOLIC PANEL
ANION GAP: 11 (ref 5–15)
Anion gap: 16 — ABNORMAL HIGH (ref 5–15)
BUN: 27 mg/dL — ABNORMAL HIGH (ref 6–20)
BUN: 76 mg/dL — AB (ref 6–20)
CALCIUM: 8.5 mg/dL — AB (ref 8.9–10.3)
CO2: 22 mmol/L (ref 22–32)
CO2: 28 mmol/L (ref 22–32)
CREATININE: 15.84 mg/dL — AB (ref 0.61–1.24)
Calcium: 9.1 mg/dL (ref 8.9–10.3)
Chloride: 101 mmol/L (ref 101–111)
Chloride: 98 mmol/L — ABNORMAL LOW (ref 101–111)
Creatinine, Ser: 7.37 mg/dL — ABNORMAL HIGH (ref 0.61–1.24)
GFR calc non Af Amer: 8 mL/min — ABNORMAL LOW (ref >60)
GFR, EST AFRICAN AMERICAN: 4 mL/min — AB (ref >60)
GFR, EST AFRICAN AMERICAN: 9 mL/min — AB (ref >60)
GFR, EST NON AFRICAN AMERICAN: 3 mL/min — AB (ref >60)
Glucose, Bld: 86 mg/dL (ref 65–99)
Glucose, Bld: 99 mg/dL (ref 65–99)
POTASSIUM: 6.5 mmol/L — AB (ref 3.5–5.1)
Potassium: 3.6 mmol/L (ref 3.5–5.1)
SODIUM: 137 mmol/L (ref 135–145)
SODIUM: 139 mmol/L (ref 135–145)

## 2015-09-08 LAB — RENAL FUNCTION PANEL
ALBUMIN: 2.6 g/dL — AB (ref 3.5–5.0)
Anion gap: 9 (ref 5–15)
BUN: 34 mg/dL — AB (ref 6–20)
CO2: 26 mmol/L (ref 22–32)
CREATININE: 9.02 mg/dL — AB (ref 0.61–1.24)
Calcium: 8.3 mg/dL — ABNORMAL LOW (ref 8.9–10.3)
Chloride: 99 mmol/L — ABNORMAL LOW (ref 101–111)
GFR calc Af Amer: 7 mL/min — ABNORMAL LOW (ref >60)
GFR, EST NON AFRICAN AMERICAN: 6 mL/min — AB (ref >60)
GLUCOSE: 147 mg/dL — AB (ref 65–99)
PHOSPHORUS: 4.3 mg/dL (ref 2.5–4.6)
Potassium: 5 mmol/L (ref 3.5–5.1)
Sodium: 134 mmol/L — ABNORMAL LOW (ref 135–145)

## 2015-09-08 LAB — TROPONIN I
TROPONIN I: 0.06 ng/mL — AB (ref <0.031)
TROPONIN I: 0.04 ng/mL — AB (ref <0.031)
TROPONIN I: 0.05 ng/mL — AB (ref <0.031)

## 2015-09-08 LAB — I-STAT TROPONIN, ED: TROPONIN I, POC: 0.09 ng/mL — AB (ref 0.00–0.08)

## 2015-09-08 LAB — MRSA PCR SCREENING: MRSA BY PCR: NEGATIVE

## 2015-09-08 MED ORDER — LEVALBUTEROL TARTRATE 45 MCG/ACT IN AERO: 2.0000 | INHALATION_SPRAY | Freq: Four times a day (QID) | RESPIRATORY_TRACT | Status: DC | PRN

## 2015-09-08 MED ORDER — HYDRALAZINE HCL 20 MG/ML IJ SOLN
INTRAMUSCULAR | Status: AC
Start: 2015-09-08 — End: 2015-09-08
  Administered 2015-09-08: 10 mg via INTRAVENOUS
  Filled 2015-09-08: qty 1

## 2015-09-08 MED ORDER — HYDRALAZINE HCL 20 MG/ML IJ SOLN
20.0000 mg | INTRAMUSCULAR | Status: DC | PRN
Start: 2015-09-08 — End: 2015-09-11
  Administered 2015-09-09: 20 mg via INTRAVENOUS

## 2015-09-08 MED ORDER — HYDROCODONE-ACETAMINOPHEN 5-325 MG PO TABS
1.0000 | ORAL_TABLET | Freq: Four times a day (QID) | ORAL | Status: DC | PRN
  Administered 2015-09-08 – 2015-09-11 (×9): 1 via ORAL
  Filled 2015-09-08 (×9): qty 1

## 2015-09-08 MED ORDER — ACETAMINOPHEN 325 MG PO TABS
650.0000 mg | ORAL_TABLET | Freq: Four times a day (QID) | ORAL | Status: DC | PRN
  Administered 2015-09-08 – 2015-09-09 (×2): 650 mg via ORAL
  Filled 2015-09-08: qty 2

## 2015-09-08 MED ORDER — LIDOCAINE HCL (PF) 1 % IJ SOLN: 5.0000 mL | INTRAMUSCULAR | Status: DC | PRN

## 2015-09-08 MED ORDER — ALBUTEROL SULFATE (2.5 MG/3ML) 0.083% IN NEBU
2.5000 mg | INHALATION_SOLUTION | Freq: Once | RESPIRATORY_TRACT | Status: AC
  Administered 2015-09-08: 2.5 mg via RESPIRATORY_TRACT
  Filled 2015-09-08: qty 3

## 2015-09-08 MED ORDER — INSULIN ASPART 100 UNIT/ML IV SOLN
10.0000 [IU] | Freq: Once | INTRAVENOUS | Status: AC
  Administered 2015-09-08: 10 [IU] via INTRAVENOUS
  Filled 2015-09-08: qty 1

## 2015-09-08 MED ORDER — ISOSORBIDE MONONITRATE ER 60 MG PO TB24
60.0000 mg | ORAL_TABLET | Freq: Every day | ORAL | Status: DC
  Administered 2015-09-08 – 2015-09-10 (×3): 60 mg via ORAL
  Filled 2015-09-08 (×3): qty 1

## 2015-09-08 MED ORDER — PENTAFLUOROPROP-TETRAFLUOROETH EX AERO: 1.0000 "application " | INHALATION_SPRAY | CUTANEOUS | Status: DC | PRN

## 2015-09-08 MED ORDER — SODIUM CHLORIDE 0.9 % IV SOLN: 100.0000 mL | INTRAVENOUS | Status: DC | PRN

## 2015-09-08 MED ORDER — AMLODIPINE BESYLATE 10 MG PO TABS
10.0000 mg | ORAL_TABLET | Freq: Every day | ORAL | Status: DC
  Administered 2015-09-08 – 2015-09-11 (×4): 10 mg via ORAL
  Filled 2015-09-08: qty 2
  Filled 2015-09-08 (×2): qty 1
  Filled 2015-09-08: qty 2

## 2015-09-08 MED ORDER — PANTOPRAZOLE SODIUM 40 MG PO TBEC
40.0000 mg | DELAYED_RELEASE_TABLET | Freq: Every day | ORAL | Status: DC
  Administered 2015-09-08 – 2015-09-11 (×4): 40 mg via ORAL
  Filled 2015-09-08 (×4): qty 1

## 2015-09-08 MED ORDER — HEPARIN SODIUM (PORCINE) 1000 UNIT/ML DIALYSIS
1000.0000 [IU] | INTRAMUSCULAR | Status: DC | PRN
  Filled 2015-09-08: qty 1

## 2015-09-08 MED ORDER — HEPARIN SODIUM (PORCINE) 1000 UNIT/ML DIALYSIS
20.0000 [IU]/kg | INTRAMUSCULAR | Status: DC | PRN
  Filled 2015-09-08: qty 2

## 2015-09-08 MED ORDER — SODIUM BICARBONATE 8.4 % IV SOLN
50.0000 meq | Freq: Once | INTRAVENOUS | Status: AC
  Administered 2015-09-08: 50 meq via INTRAVENOUS
  Filled 2015-09-08: qty 50

## 2015-09-08 MED ORDER — ONDANSETRON HCL 4 MG/2ML IJ SOLN
4.0000 mg | Freq: Four times a day (QID) | INTRAMUSCULAR | Status: DC | PRN
  Administered 2015-09-08 – 2015-09-11 (×3): 4 mg via INTRAVENOUS
  Filled 2015-09-08 (×2): qty 2

## 2015-09-08 MED ORDER — MORPHINE SULFATE (PF) 4 MG/ML IV SOLN
4.0000 mg | Freq: Once | INTRAVENOUS | Status: AC
  Administered 2015-09-08: 4 mg via INTRAVENOUS
  Filled 2015-09-08: qty 1

## 2015-09-08 MED ORDER — ONDANSETRON HCL 4 MG/2ML IJ SOLN
4.0000 mg | Freq: Once | INTRAMUSCULAR | Status: AC
  Administered 2015-09-08: 4 mg via INTRAVENOUS
  Filled 2015-09-08: qty 2

## 2015-09-08 MED ORDER — ALPRAZOLAM 0.5 MG PO TABS
0.5000 mg | ORAL_TABLET | Freq: Two times a day (BID) | ORAL | Status: DC | PRN
  Administered 2015-09-08 – 2015-09-10 (×5): 0.5 mg via ORAL
  Filled 2015-09-08 (×5): qty 1

## 2015-09-08 MED ORDER — LIDOCAINE-PRILOCAINE 2.5-2.5 % EX CREA
1.0000 "application " | TOPICAL_CREAM | CUTANEOUS | Status: DC | PRN
  Filled 2015-09-08: qty 5

## 2015-09-08 MED ORDER — CARVEDILOL 25 MG PO TABS
25.0000 mg | ORAL_TABLET | Freq: Two times a day (BID) | ORAL | Status: DC
  Administered 2015-09-08 – 2015-09-10 (×5): 25 mg via ORAL
  Filled 2015-09-08 (×5): qty 1

## 2015-09-08 MED ORDER — ONDANSETRON HCL 4 MG PO TABS
4.0000 mg | ORAL_TABLET | Freq: Four times a day (QID) | ORAL | Status: DC | PRN
  Administered 2015-09-10: 4 mg via ORAL
  Filled 2015-09-08: qty 1

## 2015-09-08 MED ORDER — GABAPENTIN 100 MG PO CAPS
100.0000 mg | ORAL_CAPSULE | Freq: Three times a day (TID) | ORAL | Status: DC
  Administered 2015-09-08 – 2015-09-10 (×9): 100 mg via ORAL
  Filled 2015-09-08 (×9): qty 1

## 2015-09-08 MED ORDER — LISINOPRIL 20 MG PO TABS
20.0000 mg | ORAL_TABLET | Freq: Every day | ORAL | Status: DC
  Administered 2015-09-08 – 2015-09-10 (×3): 20 mg via ORAL
  Filled 2015-09-08 (×4): qty 1

## 2015-09-08 MED ORDER — ACETAMINOPHEN 650 MG RE SUPP: 650.0000 mg | Freq: Four times a day (QID) | RECTAL | Status: DC | PRN

## 2015-09-08 MED ORDER — ALTEPLASE 2 MG IJ SOLR
2.0000 mg | Freq: Once | INTRAMUSCULAR | Status: DC | PRN
  Filled 2015-09-08: qty 2

## 2015-09-08 MED ORDER — SEVELAMER CARBONATE 800 MG PO TABS: 800.0000 mg | ORAL_TABLET | Freq: Two times a day (BID) | ORAL | Status: DC | PRN

## 2015-09-08 MED ORDER — CINACALCET HCL 30 MG PO TABS
30.0000 mg | ORAL_TABLET | Freq: Every day | ORAL | Status: DC
  Administered 2015-09-09 – 2015-09-10 (×2): 30 mg via ORAL
  Filled 2015-09-08 (×5): qty 1

## 2015-09-08 MED ORDER — LEVALBUTEROL HCL 0.63 MG/3ML IN NEBU: 0.6300 mg | INHALATION_SOLUTION | Freq: Four times a day (QID) | RESPIRATORY_TRACT | Status: DC | PRN

## 2015-09-08 MED ORDER — HYDRALAZINE HCL 20 MG/ML IJ SOLN
10.0000 mg | INTRAMUSCULAR | Status: DC | PRN
  Administered 2015-09-08: 10 mg via INTRAVENOUS

## 2015-09-08 MED ORDER — DEXTROSE 50 % IV SOLN
50.0000 mL | Freq: Once | INTRAVENOUS | Status: AC
  Administered 2015-09-08: 50 mL via INTRAVENOUS
  Filled 2015-09-08: qty 50

## 2015-09-08 MED ORDER — SODIUM CHLORIDE 0.9% FLUSH
3.0000 mL | Freq: Two times a day (BID) | INTRAVENOUS | Status: DC
  Administered 2015-09-08 – 2015-09-10 (×6): 3 mL via INTRAVENOUS

## 2015-09-08 MED ORDER — HYDRALAZINE HCL 50 MG PO TABS
50.0000 mg | ORAL_TABLET | Freq: Three times a day (TID) | ORAL | Status: DC
  Administered 2015-09-08 – 2015-09-10 (×5): 50 mg via ORAL
  Filled 2015-09-08 (×6): qty 1

## 2015-09-08 MED ORDER — INSULIN ASPART 100 UNIT/ML ~~LOC~~ SOLN: 0.0000 [IU] | Freq: Three times a day (TID) | SUBCUTANEOUS | Status: DC

## 2015-09-08 MED ORDER — WARFARIN - PHARMACIST DOSING INPATIENT
Freq: Every day | Status: DC
Start: 2015-09-08 — End: 2015-09-11

## 2015-09-08 MED ORDER — DOXERCALCIFEROL 4 MCG/2ML IV SOLN: 2.5000 ug | INTRAVENOUS | Status: DC

## 2015-09-08 MED ORDER — ATORVASTATIN CALCIUM 40 MG PO TABS
40.0000 mg | ORAL_TABLET | Freq: Every day | ORAL | Status: DC
  Administered 2015-09-08 – 2015-09-10 (×3): 40 mg via ORAL
  Filled 2015-09-08 (×3): qty 1

## 2015-09-08 MED ORDER — SEVELAMER CARBONATE 800 MG PO TABS
1600.0000 mg | ORAL_TABLET | Freq: Three times a day (TID) | ORAL | Status: DC
  Administered 2015-09-08 – 2015-09-11 (×8): 1600 mg via ORAL
  Filled 2015-09-08 (×8): qty 2

## 2015-09-08 MED ORDER — WARFARIN SODIUM 10 MG PO TABS
10.0000 mg | ORAL_TABLET | Freq: Once | ORAL | Status: AC
  Administered 2015-09-08: 10 mg via ORAL
  Filled 2015-09-08: qty 1

## 2015-09-08 MED ORDER — DOCUSATE SODIUM 100 MG PO CAPS
100.0000 mg | ORAL_CAPSULE | Freq: Two times a day (BID) | ORAL | Status: DC
  Administered 2015-09-08 – 2015-09-11 (×7): 100 mg via ORAL
  Filled 2015-09-08 (×7): qty 1

## 2015-09-08 MED ORDER — RENA-VITE PO TABS
1.0000 | ORAL_TABLET | Freq: Every day | ORAL | Status: DC
  Administered 2015-09-08 – 2015-09-10 (×3): 1 via ORAL
  Filled 2015-09-08 (×3): qty 1

## 2015-09-08 MED ORDER — SODIUM CHLORIDE 0.9 % IV SOLN
1.0000 g | Freq: Once | INTRAVENOUS | Status: AC
  Administered 2015-09-08: 1 g via INTRAVENOUS
  Filled 2015-09-08: qty 10

## 2015-09-08 MED ORDER — NITROGLYCERIN 0.4 MG SL SUBL
0.4000 mg | SUBLINGUAL_TABLET | SUBLINGUAL | Status: DC | PRN
  Administered 2015-09-08 (×2): 0.4 mg via SUBLINGUAL
  Filled 2015-09-08: qty 1

## 2015-09-08 MED ORDER — PREDNISONE 5 MG PO TABS
5.0000 mg | ORAL_TABLET | Freq: Every day | ORAL | Status: DC
  Administered 2015-09-08 – 2015-09-11 (×4): 5 mg via ORAL
  Filled 2015-09-08 (×4): qty 1

## 2015-09-08 MED ORDER — MORPHINE SULFATE (PF) 2 MG/ML IV SOLN
1.0000 mg | INTRAVENOUS | Status: DC | PRN
  Administered 2015-09-08 – 2015-09-10 (×9): 1 mg via INTRAVENOUS
  Filled 2015-09-08 (×8): qty 1

## 2015-09-08 NOTE — ED Notes (Signed)
Pt back from radiology

## 2015-09-08 NOTE — H&P (Signed)
[facility] History and Physical  Frank [name] AST:[id number] DOB: 25-Jan-1966 DOA: 09/07/2015  Referring physician: Dr. [name]. PCP: [name] Patient  Specialists: Nephrologist.  Chief Complaint: Shortness of breath and chest pain.  HPI: Frank [name] is a 50 y.o. male with history of ESRD on hemodialysis noncompliant presents to the ER because of shortness of breath and chest pain. Patient states he has missed dialysis for last 8 days because he didn't have a ride. In the ER patient is found to be hypertensive chest x-ray showing pulmonary edema and EKG showing junctional rhythm. Potassium was elevated at 6.5 and patient was given IV insulin and D50 bicarbonate and calcium following which patient's rhythm improved. On-call nephrology was consulted and patient will be taken for urgent dialysis. Patient states the chest pain is retrosternal has been constant and persistent along with shortness of breath. Denies any fever chills productive cough nausea vomiting or diarrhea.   Review of Systems: As presented in the history of presenting illness, rest negative.  Past Medical History  Diagnosis Date  . Hypertension   . Depression   . Complication of anesthesia     itching, sore throat  . Diabetes mellitus without complication ([place])     No history per patient, but remains under history as A1c would not be accurate given on dialysis  . Shortness of breath   . Anxiety   . ESRD (end stage renal disease) ([place])     due to HTN per patient, followed at [facility], s/p failed kidney transplant - dialysis Tue, Th, Sat  . Renal insufficiency   . CHF (congestive heart failure) ([place])   . Anemia   . Dialysis patient [facility])    Past Surgical History  Procedure Laterality Date  . Kidney receipient  2006    failed and started HD in March 2014  . Capd insertion    . Capd removal    . Left heart catheterization with coronary angiogram N/A 09/02/2014    Procedure: LEFT HEART CATHETERIZATION WITH  CORONARY ANGIOGRAM;  Surgeon: [name], MD;  Location[facility] CATH LAB;  Service: Cardiovascular;  Laterality: N/A;  . Inguinal hernia repair Right 02/14/2015    Procedure: REPAIR INCARCERATED RIGHT INGUINAL HERNIA;  Surgeon: [name], MD;  Location: [place];  Service: General;  Laterality: Right;   Social History:  reports that he has quit smoking. His smoking use included Cigarettes. He smoked 0.00 packs per day for 1 year. He has never used smokeless tobacco. He reports that he does not drink alcohol or use illicit drugs. Where does patient live home. Can patient participate in ADLs? Yes.  Allergies  Allergen Reactions  . Butalbital-Apap-Caffeine Shortness Of Breath and Swelling    Swelling in throat  . Ferrlecit [Na Ferric Gluc Cplx In Sucrose] Shortness Of Breath, Swelling and Other (See Comments)    Swelling in throat  . Minoxidil Shortness Of Breath  . Darvocet [Propoxyphene N-Acetaminophen] Hives  . Propoxyphene Hives    Allergy listed on file from [facility] (Pt did not mention any allergies)     Family History:  Family History  Problem Relation Age of Onset  . Hypertension Other       Prior to Admission medications   Medication Sig Start Date End Date Taking? Authorizing Provider  ALPRAZolam [name]) 0.5 MG tablet Take 0.5 mg by mouth 2 (two) times daily as needed for anxiety.   Yes Historical Provider, MD  amLODipine (NORVASC) 10 MG tablet Take 10 mg  by mouth daily.   Yes Historical Provider, MD  atorvastatin (LIPITOR) 40 MG tablet Take 1 tablet (40 mg total) by mouth daily at 6 PM. 09/02/14  Yes Barton Dubois, MD  carvedilol (COREG) 25 MG tablet Take 1 tablet (25 mg total) by mouth 2 (two) times daily with a meal. 10/10/14  Yes [name], DO  cinacalcet (SENSIPAR) 30 MG tablet Take 30 mg by mouth daily.   Yes Historical Provider, MD  docusate sodium (COLACE) 100 MG capsule Take 1 capsule (100 mg total) by mouth 2 (two) times daily. 11/19/14  Yes [name], MD   doxercalciferol (HECTOROL) 4 MCG/2ML injection Inject 1.25 mLs (2.5 mcg total) into the vein Every Tuesday,Thursday,and Saturday with dialysis. 10/10/14  Yes [name], DO  gabapentin (NEURONTIN) 100 MG capsule Take 100 mg by mouth 3 (three) times daily. 06/14/15 09/12/15 Yes Historical Provider, MD  hydrALAZINE (APRESOLINE) 50 MG tablet Take 1 tablet (50 mg total) by mouth every 8 (eight) hours. 02/04/15  Yes [name], MD  hydrocerin (EUCERIN) CREA Apply 1 application topically 2 (two) times daily. 02/04/15  Yes [name], MD  HYDROcodone-acetaminophen (NORCO/VICODIN) 5-325 MG tablet Take 1 tablet by mouth every 6 (six) hours as needed. 08/25/15  Yes Olivia Canter Sam, PA-C  isosorbide mononitrate (IMDUR) 60 MG 24 hr tablet Take 60 mg by mouth daily. 06/14/15 09/12/15 Yes Historical Provider, MD  levalbuterol Penne Lash HFA) 45 MCG/ACT inhaler Inhale 2 puffs into the lungs every 6 (six) hours as needed for wheezing or shortness of breath. 11/19/14  Yes [name], MD  lisinopril (PRINIVIL,ZESTRIL) 20 MG tablet Take 1 tablet (20 mg total) by mouth daily. 02/04/15  Yes [name], MD  multivitamin (RENA-VIT) TABS tablet Take 1 tablet by mouth at bedtime. 02/04/15  Yes [name], MD  omeprazole (PRILOSEC) 20 MG capsule Take 20 mg by mouth daily. 07/01/13  Yes Historical Provider, MD  predniSONE (DELTASONE) 5 MG tablet Take 5 mg by mouth daily with breakfast.   Yes Historical Provider, MD  sevelamer carbonate (RENVELA) 800 MG tablet Take 800-1,600 mg by mouth 3 (three) times daily with meals. 2 tabs three times daily with meals, and 1 tablet with snacks   Yes Historical Provider, MD  warfarin (COUMADIN) 6 MG tablet Take 1 tablet (6 mg total) by mouth daily at 6 PM. Take daily until adjusted per MD Patient taking differently: Take 10 mg by mouth daily at 6 PM. Take daily until adjusted per MD 02/17/15  Yes Eugenie Filler, MD    Physical Exam: Filed Vitals:   09/08/15 0200 09/08/15 0230  09/08/15 0300 09/08/15 0345  BP: 179/117 184/124 190/125 187/132  Pulse: 60 80 86 85  Temp:    98.3 F (36.8 C)  TempSrc:    Oral  Resp: _0 Height:    _1  (1.88 m)  Weight:    77.293 kg (170 lb 6.4 oz)  SpO2: 99% 98% 94% 95%     General:  Moderately built and nourished.  Eyes: Anicteric no pallor.  ENT: No discharge from the ears eyes nose or mouth.  Neck: No mass felt.  Cardiovascular: S1-S2 heard.  Respiratory: No rhonchi or crepitations.  Abdomen: Soft nontender bowel sounds present.  Skin: No rash.  Musculoskeletal: No edema.  Psychiatric: Appears normal.  Neurologic: Alert awake oriented to time place and person. Moves all extremities.  Labs on Admission:  Basic Metabolic Panel:  Recent Labs  Lab 09/01/15 2339 09/07/15 2357  NA 137 139  K 5.9* 6.5*  CL 97* 101  CO2 24 22  GLUCOSE 88 86  BUN 71* 76*  CREATININE 13.83* 15.84*  CALCIUM 8.8* 9.1   Liver Function Tests: No results for input(s): AST, ALT, ALKPHOS, BILITOT, PROT, ALBUMIN in the last 168 hours. No results for input(s): LIPASE, AMYLASE in the last 168 hours. No results for input(s): AMMONIA in the last 168 hours. CBC:  Recent Labs Lab 09/01/15 2339 09/07/15 2357  WBC 6.4 5.8  HGB 8.2* 8.2*  HCT 25.9* 25.5*  MCV 86.9 85.6  PLT 216 174   Cardiac Enzymes: No results for input(s): CKTOTAL, CKMB, CKMBINDEX, TROPONINI in the last 168 hours.  BNP (last 3 results)  Recent Labs  01/09/15 1529 01/26/15 0421 07/17/15 2135  BNP 3606.1* >4500.0* >4500.0*    ProBNP (last 3 results) No results for input(s): PROBNP in the last 8760 hours.  CBG: No results for input(s): GLUCAP in the last 168 hours.  Radiological Exams on Admission: Dg Chest 2 View  09/08/2015  CLINICAL DATA:  Chest pain radiating to the left arm. Shortness of breath. Nausea and vomiting beginning tonight. Cough and congestion. EXAM: CHEST  2 VIEW COMPARISON:  09/01/2015 FINDINGS: Right central venous  catheter with tip over the cavoatrial junction/upper RA. Diffuse cardiac enlargement. Mild pulmonary vascular congestion. No edema or consolidation. No blunting of costophrenic angles. No pneumothorax. IMPRESSION: Cardiac enlargement with mild pulmonary vascular congestion suggesting fluid overload. No edema or consolidation. Electronically Signed   By: Lucienne Capers M.D.   On: 09/08/2015 02:12    EKG: Independently reviewed. Junctional bradycardia.  Assessment/Plan Principal Problem:   Acute respiratory failure with hypoxia (HCC) Active Problems:   ESRD on dialysis- Tues, Thurs, Sat in [facility], nephro: [name]   DM (diabetes mellitus), type 2, uncontrolled, with renal complications (HCC)   Hyperkalemia   Chest pain   Hypertensive urgency   Acute respiratory failure ([place])   1. Acute hypoxic respiratory failure secondary to pulmonary edema secondary to missing dialysis - nephrology has been consulted. Patient will be going for urgent dialysis. Closely follow intake output metabolic panel. 2. ESRD on hemodialysis with hyperkalemia due to missed dialysis - going for urgent dialysis. Patient's rhythm initially was junctional bradycardia which improved with calcium D50 and IV insulin. 3. Chest pain - probably from fluid overload and hypertensive urgency. Cycle cardiac markers. I think patient chest pain will improve with blood pressure control and dialysis. 4. Hypertensive urgency - due to missed dialysis. Patient is receiving urgent dialysis. Addition I have placed patient on when necessary IV hydralazine. Will hold off Coreg until patient's heart rate stabilizes. Was in junctional bradycardia initially. 5. Chronic atrial fibrillation - chads 2 vasc score is 2. Patient is on Coumadin. Coreg on hold due to initial presentation of junctional bradycardia. May resume after her heart rate stabilizes. 6. Chronic anemia probably from ESRD - follow CBC.   DVT Prophylaxis Coumadin.  Code Status:  Full code.  Family Communication: Discussed with patient.  Disposition Plan: Admit to inpatient.    KAKRAKANDY,ARSHAD N. [facility] Pager [phone].  If 7PM-7AM, please contact night-coverage [email or web address] Password [email or web address] 09/08/2015, 4:08 AM

## 2015-09-08 NOTE — Progress Notes (Signed)
ESRD Hyperkalemia with bradycardia Plan: Dialysis again in AM.  Subjective: Interval History: He is back again after having missed dialysis for a week due to trancposrtation issues.  He was bradycardic and hyperkalemia.  He still has a right IJ TDC.  Objective: Vital signs in last 24 hours: Temp:  [97.9 F (36.6 C)-98.3 F (36.8 C)] 98.3 F (36.8 C) (02/17 0800) Pulse Rate:  [52-87] 82 (02/17 1252) Resp:  [12-21] 20 (02/17 1252) BP: (157-215)/(103-140) 180/103 mmHg (02/17 1252) SpO2:  [94 %-100 %] 96 % (02/17 0428) Weight:  [77.2 kg (170 lb 3.1 oz)-77.293 kg (170 lb 6.4 oz)] 77.2 kg (170 lb 3.1 oz) (02/17 0428) Weight change:   Intake/Output from previous day:   Intake/Output this shift: Total I/O In: -  Out: 3000 [Other:3000]  General appearance: alert and cooperative Resp: clear to auscultation bilaterally Chest wall: no tenderness Cardio: regular rate and rhythm, S1, S2 normal, no murmur, click, rub or gallop Extremities: extremities normal, atraumatic, no cyanosis or edema  Lab Results:  Recent Labs  09/07/15 2357 09/08/15 1012  WBC 5.8 4.7  HGB 8.2* 7.8*  HCT 25.5* 23.2*  PLT 174 168   BMET:  Recent Labs  09/07/15 2357 09/08/15 1012  NA 139 137  K 6.5* 3.6  CL 101 98*  CO2 22 28  GLUCOSE 86 99  BUN 76* 27*  CREATININE 15.84* 7.37*  CALCIUM 9.1 8.5*   No results for input(s): PTH in the last 72 hours. Iron Studies: No results for input(s): IRON, TIBC, TRANSFERRIN, FERRITIN in the last 72 hours. Studies/Results: Dg Chest 2 View  09/08/2015  CLINICAL DATA:  Chest pain radiating to the left arm. Shortness of breath. Nausea and vomiting beginning tonight. Cough and congestion. EXAM: CHEST  2 VIEW COMPARISON:  09/01/2015 FINDINGS: Right central venous catheter with tip over the cavoatrial junction/upper RA. Diffuse cardiac enlargement. Mild pulmonary vascular congestion. No edema or consolidation. No blunting of costophrenic angles. No pneumothorax.  IMPRESSION: Cardiac enlargement with mild pulmonary vascular congestion suggesting fluid overload. No edema or consolidation. Electronically Signed   By: Lucienne Capers M.D.   On: 09/08/2015 02:12    Scheduled: . amLODipine  10 mg Oral Daily  . atorvastatin  40 mg Oral q1800  . carvedilol  25 mg Oral BID WC  . cinacalcet  30 mg Oral Q breakfast  . docusate sodium  100 mg Oral BID  . gabapentin  100 mg Oral TID  . hydrALAZINE  50 mg Oral 3 times per day  . insulin aspart  0-9 Units Subcutaneous TID WC  . isosorbide mononitrate  60 mg Oral Daily  . lisinopril  20 mg Oral Daily  . multivitamin  1 tablet Oral QHS  . pantoprazole  40 mg Oral Daily  . predniSONE  5 mg Oral Q breakfast  . sevelamer carbonate  1,600 mg Oral TID WC  . sodium chloride flush  3 mL Intravenous Q12H  . warfarin  10 mg Oral ONCE-1800  . Warfarin - Pharmacist Dosing Inpatient   Does not apply q1800    LOS: 0 days   Meshawn Oconnor C 09/08/2015,4:39 PM

## 2015-09-08 NOTE — Progress Notes (Signed)
Frank [name] (Dialysis RN ) came up to the unit to take him for dialysis and he had just finsihed eatig some chicken noodle soup after I got orders and now he won't go to dialysis until he gets more to eat, gave Frank [name] some cereal and milk, he will eat it in his bed on the way up to Dialysis. HR in the 80's and looks like SR with 1st Degree AV block with a PR interval of 0.24 but his B/P is elevated and has been all day in the 180-200's/110-130's and MD is aware. Patient needs his dialysis at this time and will see how he feels when he returns.

## 2015-09-08 NOTE — Discharge Instructions (Addendum)
Follow with Primary MD in 2 days   Get CBC, CMP, INR,  2 view Chest X ray checked  by Primary MD next visit.    Activity: As tolerated with Full fall precautions use walker/cane & assistance as needed   Disposition Home     Diet:   Renal - Check your Weight same time everyday, if you gain over 2 pounds, or you develop in leg swelling, experience more shortness of breath or chest pain, call your Primary MD immediately. Follow Cardiac Low Salt Diet and 1.2 lit/day fluid restriction.   On your next visit with your primary care physician please Get Medicines reviewed and adjusted.   Please request your Prim.MD to go over all Hospital Tests and Procedure/Radiological results at the follow up, please get all Hospital records sent to your Prim MD by signing hospital release before you go home.   If you experience worsening of your admission symptoms, develop shortness of breath, life threatening emergency, suicidal or homicidal thoughts you must seek medical attention immediately by calling 911 or calling your MD immediately  if symptoms less severe.  You Must read complete instructions/literature along with all the possible adverse reactions/side effects for all the Medicines you take and that have been prescribed to you. Take any new Medicines after you have completely understood and accpet all the possible adverse reactions/side effects.   Do not drive, operating heavy machinery, perform activities at heights, swimming or participation in water activities or provide baby sitting services if your were admitted for syncope or siezures until you have seen by Primary MD or a Neurologist and advised to do so again.  Do not drive when taking Pain medications.    Do not take more than prescribed Pain, Sleep and Anxiety Medications  Special Instructions: If you have smoked or chewed Tobacco  in the last 2 yrs please stop smoking, stop any regular Alcohol  and or any Recreational drug use.  Wear  Seat belts while driving.   Please note  You were cared for by a hospitalist during your hospital stay. If you have any questions about your discharge medications or the care you received while you were in the hospital after you are discharged, you can call the unit and asked to speak with the hospitalist on call if the hospitalist that took care of you is not available. Once you are discharged, your primary care physician will handle any further medical issues. Please note that NO REFILLS for any discharge medications will be authorized once you are discharged, as it is imperative that you return to your primary care physician (or establish a relationship with a primary care physician if you do not have one) for your aftercare needs so that they can reassess your need for medications and monitor your lab values.    Information on my medicine - Coumadin   (Warfarin)  This medication education was reviewed with me or my healthcare representative as part of my discharge preparation.  The pharmacist that spoke with me during my hospital stay was:  Wayland Salinas, [facility]  Why was Coumadin prescribed for you? Coumadin was prescribed for you because you have a blood clot or a medical condition that can cause an increased risk of forming blood clots. Blood clots can cause serious health problems by blocking the flow of blood to the heart, lung, or brain. Coumadin can prevent harmful blood clots from forming. As a reminder your indication for Coumadin is:   Stroke Prevention Because Of  Atrial Fibrillation  What test will check on my response to Coumadin? While on Coumadin (warfarin) you will need to have an INR test regularly to ensure that your dose is keeping you in the desired range. The INR (international normalized ratio) number is calculated from the result of the laboratory test called prothrombin time (PT).  If an INR APPOINTMENT HAS NOT ALREADY BEEN MADE FOR YOU please schedule an  appointment to have this lab work done by your health care provider within 7 days. Your INR goal is usually a number between:  2 to 3 or your provider may give you a more narrow range like 2-2.5.  Ask your health care provider during an office visit what your goal INR is.  What  do you need to  know  About  COUMADIN? Take Coumadin (warfarin) exactly as prescribed by your healthcare provider about the same time each day.  DO NOT stop taking without talking to the doctor who prescribed the medication.  Stopping without other blood clot prevention medication to take the place of Coumadin may increase your risk of developing a new clot or stroke.  Get refills before you run out.  What do you do if you miss a dose? If you miss a dose, take it as soon as you remember on the same day then continue your regularly scheduled regimen the next day.  Do not take two doses of Coumadin at the same time.  Important Safety Information A possible side effect of Coumadin (Warfarin) is an increased risk of bleeding. You should call your healthcare provider right away if you experience any of the following: ? Bleeding from an injury or your nose that does not stop. ? Unusual colored urine (red or dark brown) or unusual colored stools (red or black). ? Unusual bruising for unknown reasons. ? A serious fall or if you hit your head (even if there is no bleeding).  Some foods or medicines interact with Coumadin (warfarin) and might alter your response to warfarin. To help avoid this: ? Eat a balanced diet, maintaining a consistent amount of Vitamin K. ? Notify your provider about major diet changes you plan to make. ? Avoid alcohol or limit your intake to 1 drink for women and 2 drinks for men per day. (1 drink is 5 oz. wine, 12 oz. beer, or 1.5 oz. liquor.)  Make sure that ANY health care provider who prescribes medication for you knows that you are taking Coumadin (warfarin).  Also make sure the healthcare provider  who is monitoring your Coumadin knows when you have started a new medication including herbals and non-prescription products.  Coumadin (Warfarin)  Major Drug Interactions  Increased Warfarin Effect Decreased Warfarin Effect  Alcohol (large quantities) Antibiotics (esp. Septra/Bactrim, Flagyl, Cipro) Amiodarone (Cordarone) Aspirin (ASA) Cimetidine (Tagamet) Megestrol (Megace) NSAIDs (ibuprofen, naproxen, etc.) Piroxicam (Feldene) Propafenone (Rythmol SR) Propranolol (Inderal) Isoniazid (INH) Posaconazole (Noxafil) Barbiturates (Phenobarbital) Carbamazepine (Tegretol) Chlordiazepoxide (Librium) Cholestyramine (Questran) Griseofulvin Oral Contraceptives Rifampin Sucralfate (Carafate) Vitamin K   Coumadin (Warfarin) Major Herbal Interactions  Increased Warfarin Effect Decreased Warfarin Effect  Garlic Ginseng Ginkgo biloba Coenzyme Q10 Green tea St. Johns wort    Coumadin (Warfarin) FOOD Interactions  Eat a consistent number of servings per week of foods HIGH in Vitamin K (1 serving =  cup)  Collards (cooked, or boiled & drained) Kale (cooked, or boiled & drained) Mustard greens (cooked, or boiled & drained) Parsley *serving size only =  cup Spinach (cooked, or boiled & drained) Swiss  chard (cooked, or boiled & drained) Turnip greens (cooked, or boiled & drained)  Eat a consistent number of servings per week of foods MEDIUM-HIGH in Vitamin K (1 serving = 1 cup)  Asparagus (cooked, or boiled & drained) Broccoli (cooked, boiled & drained, or raw & chopped) Brussel sprouts (cooked, or boiled & drained) *serving size only =  cup Lettuce, raw (green leaf, endive, romaine) Spinach, raw Turnip greens, raw & chopped   These websites have more information on Coumadin (warfarin):  FailFactory.se; VeganReport.com.au;

## 2015-09-08 NOTE — Consult Note (Signed)
CARDIOLOGY CONSULT NOTE       Patient ID: Frank [name] MRN: [id number] DOB/AGE: 1966/02/19 50 y.o.  Admit date: 09/07/2015 Referring Physician:  Hgimire Primary Physician: [name] Patient Primary Cardiologist:  Gwenlyn Found Reason for Consultation:  Chest Pain  Principal Problem:   Acute respiratory failure with hypoxia ([place]) Active Problems:   ESRD on dialysis- Tues, Thurs, Sat in [employer], nephro: [name]   DM (diabetes mellitus), type 2, uncontrolled, with renal complications ([place])   Hyperkalemia   Chest pain   Hypertensive urgency   Acute respiratory failure ([facility])   HPI:   50 y.o. currently on dialysis .  Asked to see for chest pain. Noncompliant History of previous renal transplant at [facility].  Has missed dialysis for 8 days.  Admitted uremic in junctional rhythm with high K.  History of presumed non ischemic DCM with EF 35-40% by echo 11/30/14. Pain is constant center chest to back positional not pleuritic.  Troponin only .09 ECG with LVH and rhythm changes from hyperkalemia.  Has had volume overload in setting of missing dialysis in past.  Has been in jail in past.  No documented CAD.  CRF;s  DM and HTN  ROS All other systems reviewed and negative except as noted above  Past Medical History  Diagnosis Date  . Hypertension   . Depression   . Complication of anesthesia     itching, sore throat  . Diabetes mellitus without complication ([place])     No history per patient, but remains under history as A1c would not be accurate given on dialysis  . Shortness of breath   . Anxiety   . ESRD (end stage renal disease) ([place])     due to HTN per patient, followed at [facility], s/p failed kidney transplant - dialysis Tue, Th, Sat  . Renal insufficiency   . CHF (congestive heart failure) ([place])   . Anemia   . Dialysis patient [facility])     Family History  Problem Relation Age of Onset  . Hypertension Other     Social History   Social History  . Marital Status: Married    Spouse  Name: N/A  . Number of Children: 3  . Years of Education: [employer]   Occupational History  . Not on file.   Social History Main Topics  . Smoking status: Former Smoker -- 0.00 packs/day for 1 years    Types: Cigarettes  . Smokeless tobacco: Never Used     Comment: quit Jan 2014  . Alcohol Use: No  . Drug Use: No  . Sexual Activity: Not Currently   Other Topics Concern  . Not on file   Social History Narrative   Owns own plumbing company    Past Surgical History  Procedure Laterality Date  . Kidney receipient  2006    failed and started HD in March 2014  . Capd insertion    . Capd removal    . Left heart catheterization with coronary angiogram N/A 09/02/2014    Procedure: LEFT HEART CATHETERIZATION WITH CORONARY ANGIOGRAM;  Surgeon: [name], MD;  Location: [facility] CATH LAB;  Service: Cardiovascular;  Laterality: N/A;  . Inguinal hernia repair Right 02/14/2015    Procedure: REPAIR INCARCERATED RIGHT INGUINAL HERNIA;  Surgeon: [name], MD;  Location: [facility];  Service: General;  Laterality: Right;     . amLODipine  10 mg Oral Daily  . atorvastatin  40 mg Oral q1800  . cinacalcet  30 mg Oral Q  breakfast  . docusate sodium  100 mg Oral BID  . gabapentin  100 mg Oral TID  . hydrALAZINE  50 mg Oral 3 times per day  . insulin aspart  0-9 Units Subcutaneous TID WC  . isosorbide mononitrate  60 mg Oral Daily  . multivitamin  1 tablet Oral QHS  . pantoprazole  40 mg Oral Daily  . predniSONE  5 mg Oral Q breakfast  . sevelamer carbonate  1,600 mg Oral TID WC  . sodium chloride flush  3 mL Intravenous Q12H  . warfarin  10 mg Oral ONCE-1800  . Warfarin - Pharmacist Dosing Inpatient   Does not apply q1800      Physical Exam: Blood pressure 197/136, pulse 81, temperature 97.9 F (36.6 C), temperature source Oral, resp. rate 20, height 6' 2" (1.88 m), weight 77.2 kg (170 lb 3.1 oz), SpO2 96 %.    Lethargic Chronically ill black male  HEENT: normal Neck supple with no  adenopathy JVP normal no bruits no thyromegaly Lungs clear with no wheezing and good diaphragmatic motion Heart:  S1/S2 SEM  no rub, gallop or click PMI normal Abdomen: benighn, BS positve, no tenderness, no AAA no bruit.  No HSM or HJR Distal pulses intact with no bruits Plus one bilateral edema Neuro non-focal Skin warm and dry No muscular weakness Triple lumen dialysis catheter right subclavian   Labs:   Lab Results  Component Value Date   WBC 5.8 09/07/2015   HGB 8.2* 09/07/2015   HCT 25.5* 09/07/2015   MCV 85.6 09/07/2015   PLT 174 09/07/2015    Recent Labs Lab 09/07/15 2357  NA 139  K 6.5*  CL 101  CO2 22  BUN 76*  CREATININE 15.84*  CALCIUM 9.1  GLUCOSE 86   Lab Results  Component Value Date   TROPONINI 0.05* 08/19/2015   No results found for: CHOL No results found for: HDL No results found for: LDLCALC No results found for: TRIG No results found for: CHOLHDL No results found for: LDLDIRECT    Radiology: Dg Chest 2 View  09/08/2015  CLINICAL DATA:  Chest pain radiating to the left arm. Shortness of breath. Nausea and vomiting beginning tonight. Cough and congestion. EXAM: CHEST  2 VIEW COMPARISON:  09/01/2015 FINDINGS: Right central venous catheter with tip over the cavoatrial junction/upper RA. Diffuse cardiac enlargement. Mild pulmonary vascular congestion. No edema or consolidation. No blunting of costophrenic angles. No pneumothorax. IMPRESSION: Cardiac enlargement with mild pulmonary vascular congestion suggesting fluid overload. No edema or consolidation. Electronically Signed   By: Lucienne Capers M.D.   On: 09/08/2015 02:12   Dg Chest 2 View  09/02/2015  CLINICAL DATA:  50 year old male with shortness of breath and chest pain EXAM: CHEST  2 VIEW COMPARISON:  Radiograph dated 08/25/2015 FINDINGS: 50 year old catheter is noted with tip over the right atrium in stable positioning. There is stable cardiomegaly with mild vascular congestion. No focal  consolidation, pleural effusion, or pneumothorax. No acute osseous pathology. IMPRESSION: No interval change. Electronically Signed   By: Anner Crete M.D.   On: 09/02/2015 00:02   Dg Chest 2 View  08/18/2015  CLINICAL DATA:  Chest pain and shortness of breath while at dialysis today. EXAM: CHEST  2 VIEW COMPARISON:  08/10/2015. FINDINGS: The cardio pericardial silhouette is enlarged. There is pulmonary vascular congestion without overt pulmonary edema. The lungs are clear wiithout focal pneumonia, pneumothorax or pleural effusion. Right IJ dialysis catheter tip projects at the level of the mid  right atrium. The visualized bony structures of the thorax are intact. IMPRESSION: Cardiomegaly with vascular congestion. Electronically Signed   By: Misty Stanley M.D.   On: 08/18/2015 15:57   Dg Chest 2 View  08/10/2015  CLINICAL DATA:  Shortness of breath.  Chronic renal failure EXAM: CHEST  2 VIEW COMPARISON:  July 17, 2015 FINDINGS: There is cardiomegaly with mild pulmonary venous hypertension. There is a minimal right effusion. No edema or consolidation. Central catheter tip is in the right atrium slightly beyond the cavoatrial junction. No pneumothorax. No adenopathy. IMPRESSION: Persistent pulmonary vascular congestion. No frank edema or consolidation. Minimal right effusion. No change and central catheter position. No pneumothorax. Electronically Signed   By: Lowella Grip III M.D.   On: 08/10/2015 09:16   Dg Chest Port 1 View  08/19/2015  CLINICAL DATA:  50 year old male with shortness of breath. End-stage renal disease. EXAM: PORTABLE CHEST 1 VIEW COMPARISON:  08/18/2015 FINDINGS: Cardiomegaly and pulmonary vascular congestion noted. Slightly increased left retrocardiac opacity may represent atelectasis. Trace bilateral pleural effusions are present. There is no evidence of pneumothorax. A right IJ central venous catheter is present with tip overlying the upper right atrium. IMPRESSION: Little  significant change except for possible development of left lower lung atelectasis. Cardiomegaly, pulmonary vascular congestion and trace pleural effusions again noted. Electronically Signed   By: Margarette Canada M.D.   On: 08/19/2015 07:07   Dg Abd Acute W/chest  08/25/2015  CLINICAL DATA:  BILATERAL groin pain greater on RIGHT, recent hernia surgery 4 days ago, hypertension, diabetes mellitus, end- stage renal disease on dialysis, CHF, former smoker EXAM: DG ABDOMEN ACUTE W/ 1V CHEST COMPARISON:  08/19/2015 chest radiograph, abdominal radiograph 02/11/2015 FINDINGS: RIGHT jugular dual-lumen central venous catheter with tip projecting over high RIGHT atrium. Enlargement of cardiac silhouette with pulmonary vascular congestion. Central peribronchial thickening with question minimal perihilar edema asymmetric on RIGHT. Remaining lungs clear. No definite pleural effusion or pneumothorax. Normal bowel gas pattern with scattered stool and gas throughout colon. No bowel dilatation, bowel wall thickening, or free intraperitoneal air. Osseous structures unremarkable. Scattered atherosclerotic calcifications. No definite urinary tract calcification. IMPRESSION: Enlargement of cardiac silhouette with pulmonary vascular congestion and question minimal asymmetric RIGHT perihilar edema. No acute abdominal findings. Electronically Signed   By: Lavonia Dana M.D.   On: 08/25/2015 14:37    EKG: junctional rhythm LVH   ASSESSMENT AND PLAN:  Chest Pain:  Atypical likely related to uremia with possible perciardial inflammation.  No acute ECG changes Can consider f/u myovue but would not order until bradycardic rhythms have resolved  ? Sunday  CHF:  Will order echo to reassess EF and r/o pericardial effusion   HTN:  Non compliant continue hydralazine , nitrates and amlodipine    CRF:   On dialysis now will need to have it done next 2-3 days to catch up  Rhythm:  Junctional related to hyperkalemia follow avoid beta  blockers  Signed: Jenkins Rouge 09/08/2015, 8:18 AM

## 2015-09-08 NOTE — ED Notes (Signed)
Admitting MD at bedside

## 2015-09-08 NOTE — ED Provider Notes (Signed)
CSN: [id number]     Arrival date & time 09/07/15  2330 History   By signing my name below, I, Frank [name], attest that this documentation has been prepared under the direction and in the presence of Delora Fuel, MD.  Electronically Signed: Forrestine [name], ED Scribe. 09/08/2015. 12:35 AM.   Chief Complaint  Patient presents with  . Chest Pain  . Vascular Access Problem    needs hemodialysis  . Hypertension   HPI  HPI Comments: Frank [name] brought in by EMS is a 50 y.o. male with a PMHx of HTN, DM, ESRD, and CHF who presents to the Emergency Department complaining of constant, ongoing L upper chest pain that radiates into the L arm onset few hours prior to arrival . Currently he rates pain 9/10 and describes as sharp. Discomfort is exacerbated with pressure to area and with deep breathing. No alleviating factors at this time. He states chest pain feels similar to chest pain experienced in the past but worsened. Pt also reports nausea, vomiting, and mild shortness of breath. 324 mg of ASA given en route to department. However, no OTC/prescribed medications attempted at home.  Pt has not been dialyzed in 10 days as his car recently broke down and has not had any transportation. Frank [name] states he has been complaint with all medications. He is currently on Coumadin daily; he is unaware of last PT-INR. PSHx includes left heart catheterization with coronary angiogram 09/02/2014.   PCP: [name] Patient    Past Medical History  Diagnosis Date  . Hypertension   . Depression   . Complication of anesthesia     itching, sore throat  . Diabetes mellitus without complication ([place])     No history per patient, but remains under history as A1c would not be accurate given on dialysis  . Shortness of breath   . Anxiety   . ESRD (end stage renal disease) ([place])     due to HTN per patient, followed at [facility], s/p failed kidney transplant - dialysis Tue, Th, Sat  . Renal insufficiency   . CHF (congestive  heart failure) ([place])   . Anemia   . Dialysis patient [facility])    Past Surgical History  Procedure Laterality Date  . Kidney receipient  2006    failed and started HD in March 2014  . Capd insertion    . Capd removal    . Left heart catheterization with coronary angiogram N/A 09/02/2014    Procedure: LEFT HEART CATHETERIZATION WITH CORONARY ANGIOGRAM;  Surgeon: [name], MD;  Location: [facility] CATH LAB;  Service: Cardiovascular;  Laterality: N/A;  . Inguinal hernia repair Right 02/14/2015    Procedure: REPAIR INCARCERATED RIGHT INGUINAL HERNIA;  Surgeon: [name], MD;  Location: [facility];  Service: General;  Laterality: Right;   History reviewed. No pertinent family history. Social History  Substance Use Topics  . Smoking status: Former Smoker -- 0.00 packs/day for 1 years    Types: Cigarettes  . Smokeless tobacco: Never Used     Comment: quit Jan 2014  . Alcohol Use: No    Review of Systems  Constitutional: Negative for fever and chills.  Respiratory: Negative for cough and shortness of breath.   Cardiovascular: Positive for chest pain.  Gastrointestinal: Positive for nausea and vomiting. Negative for abdominal pain.  Neurological: Negative for headaches.  Psychiatric/Behavioral: Negative for confusion.  All other systems reviewed and are negative.     Allergies  Butalbital-apap-caffeine; Ferrlecit; Minoxidil; Darvocet;  and Propoxyphene  Home Medications   Prior to Admission medications   Medication Sig Start Date End Date Taking? Authorizing Provider  ALPRAZolam [name]) 0.5 MG tablet Take 0.5 mg by mouth 2 (two) times daily as needed for anxiety.    Historical Provider, MD  amLODipine (NORVASC) 10 MG tablet Take 10 mg by mouth daily.    Historical Provider, MD  atorvastatin (LIPITOR) 40 MG tablet Take 1 tablet (40 mg total) by mouth daily at 6 PM. 09/02/14   Barton Dubois, MD  carvedilol (COREG) 25 MG tablet Take 1 tablet (25 mg total) by mouth 2 (two) times daily with a  meal. 10/10/14   [name], DO  cinacalcet (SENSIPAR) 30 MG tablet Take 30 mg by mouth daily.    Historical Provider, MD  docusate sodium (COLACE) 100 MG capsule Take 1 capsule (100 mg total) by mouth 2 (two) times daily. 11/19/14   [name], MD  doxercalciferol (HECTOROL) 4 MCG/2ML injection Inject 1.25 mLs (2.5 mcg total) into the vein Every Tuesday,Thursday,and Saturday with dialysis. 10/10/14   [name], DO  gabapentin (NEURONTIN) 100 MG capsule Take 100 mg by mouth 3 (three) times daily. 06/14/15 09/12/15  Historical Provider, MD  hydrALAZINE (APRESOLINE) 50 MG tablet Take 1 tablet (50 mg total) by mouth every 8 (eight) hours. 02/04/15   [name], MD  hydrocerin (EUCERIN) CREA Apply 1 application topically 2 (two) times daily. 02/04/15   [name], MD  HYDROcodone-acetaminophen (NORCO/VICODIN) 5-325 MG tablet Take 1 tablet by mouth 2 (two) times daily as needed for moderate pain or severe pain. Patient not taking: Reported on 09/02/2015 08/20/15   Marijean Heath, NP  HYDROcodone-acetaminophen (NORCO/VICODIN) 5-325 MG tablet Take 1 tablet by mouth every 6 (six) hours as needed. 08/25/15   Olivia Canter Sam, PA-C  isosorbide mononitrate (IMDUR) 60 MG 24 hr tablet Take 60 mg by mouth daily. 06/14/15 09/12/15  Historical Provider, MD  levalbuterol Penne Lash HFA) 45 MCG/ACT inhaler Inhale 2 puffs into the lungs every 6 (six) hours as needed for wheezing or shortness of breath. 11/19/14   [name], MD  lisinopril (PRINIVIL,ZESTRIL) 20 MG tablet Take 1 tablet (20 mg total) by mouth daily. 02/04/15   [name], MD  multivitamin (RENA-VIT) TABS tablet Take 1 tablet by mouth at bedtime. 02/04/15   [name], MD  omeprazole (PRILOSEC) 20 MG capsule Take 20 mg by mouth daily. 07/01/13   Historical Provider, MD  predniSONE (DELTASONE) 5 MG tablet Take 5 mg by mouth daily with breakfast.    Historical Provider, MD  sevelamer carbonate (RENVELA) 800 MG tablet Take 800-1,600 mg by  mouth 3 (three) times daily with meals. 2 tabs three times daily with meals, and 1 tablet with snacks    Historical Provider, MD  warfarin (COUMADIN) 6 MG tablet Take 1 tablet (6 mg total) by mouth daily at 6 PM. Take daily until adjusted per MD 02/17/15   Eugenie Filler, MD   Triage Vitals: BP 180/122 mmHg  Pulse 58  Resp 20  SpO2 100%   Physical Exam  Constitutional: He is oriented to person, place, and time. He appears well-developed and well-nourished.  HENT:  Head: Normocephalic and atraumatic.  Eyes: EOM are normal. Pupils are equal, round, and reactive to light.  Neck: Normal range of motion. Neck supple. No JVD present.  Cardiovascular: Normal rate, regular rhythm, normal heart sounds and intact distal pulses.   No murmur heard. Pulmonary/Chest: Effort  normal and breath sounds normal. He has no wheezes. He has no rales. He exhibits tenderness.  Dialysis catheter present in R subclavian  Moderate tenderness to L parasternal muscle   Abdominal: Soft. Bowel sounds are normal. He exhibits no distension and no mass. There is no tenderness.  Musculoskeletal: Normal range of motion. He exhibits no edema.  Lymphadenopathy:    He has no cervical adenopathy.  Neurological: He is alert and oriented to person, place, and time. No cranial nerve deficit. He exhibits normal muscle tone. Coordination normal.  Skin: Skin is warm and dry. No rash noted.  Psychiatric: He has a normal mood and affect.  Nursing note and vitals reviewed.   ED Course  Procedures (including critical care time)  DIAGNOSTIC STUDIES: Oxygen Saturation is 99% on RA, Normal by my interpretation.    COORDINATION OF CARE: 12:33 AM- Will give BMP, i-stat troponin, CBC, CXR, PT-INR, and EKG. Discussed treatment plan with pt at bedside and pt agreed to plan.     Labs Review Results for orders placed or performed during the hospital encounter of 31/51/76  Basic metabolic panel  Result Value Ref Range   Sodium 139  135 - 145 mmol/L   Potassium 6.5 (HH) 3.5 - 5.1 mmol/L   Chloride 101 101 - 111 mmol/L   CO2 22 22 - 32 mmol/L   Glucose, Bld 86 65 - 99 mg/dL   BUN 76 (H) 6 - 20 mg/dL   Creatinine, Ser 15.84 (H) 0.61 - 1.24 mg/dL   Calcium 9.1 8.9 - 10.3 mg/dL   GFR calc non Af Amer 3 (L) >60 mL/min   GFR calc Af Amer 4 (L) >60 mL/min   Anion gap 16 (H) 5 - 15  CBC  Result Value Ref Range   WBC 5.8 4.0 - 10.5 K/uL   RBC 2.98 (L) 4.22 - 5.81 MIL/uL   Hemoglobin 8.2 (L) 13.0 - 17.0 g/dL   HCT 25.5 (L) 39.0 - 52.0 %   MCV 85.6 78.0 - 100.0 fL   MCH 27.5 26.0 - 34.0 pg   MCHC 32.2 30.0 - 36.0 g/dL   RDW 14.8 11.5 - 15.5 %   Platelets 174 150 - 400 K/uL  Protime-INR  Result Value Ref Range   Prothrombin Time 18.4 (H) 11.6 - 15.2 seconds   INR 1.53 (H) 0.00 - 1.49  I-stat troponin, ED (not at [facility], [facility])  Result Value Ref Range   Troponin i, poc 0.09 (HH) 0.00 - 0.08 ng/mL   Comment NOTIFIED PHYSICIAN    Comment 3            Imaging Review No results found. I have personally reviewed and evaluated these images and lab results as part of my medical decision-making.   EKG Interpretation   Date/Time:  Thursday September 07 2015 23:40:25 EST Ventricular Rate:  70 PR Interval:  186 QRS Duration: 101 QT Interval:  450 QTC Calculation: 486 R Axis:   -2 Text Interpretation:  Sinus rhythm Repol abnrm suggests ischemia,  anterolateral When compared with ECG of 09/01/2015, No significant change  was found Confirmed by [facility]  MD, Dyllen Menning ([id number]) on 09/07/2015 11:48:10 PM       EKG Interpretation  Date/Time:  Friday September 08 2015 01:36:58 EST Ventricular Rate:  52 PR Interval:  186 QRS Duration: 109 QT Interval:  527 QTC Calculation: 490 R Axis:   -53 Text Interpretation:  Junctional rhythm Left anterior fascicular block LVH with secondary repolarization abnormality Baseline wander in lead(s)  V2 Partial missing lead(s): V2 V3 V4 When compared with ECG of 09/07/2015, Junctional rhythm has  replaced Sinus rhythm Confirmed by [name]  MD, Arion Morgan ([id number]) on 09/08/2015 2:07:28 AM      CRITICAL CARE Performed by: FEXMD,[email or web address] Total critical care time: 80 minutes Critical care time was exclusive of separately billable procedures and treating other patients. Critical care was necessary to treat or prevent imminent or life-threatening deterioration. Critical care was time spent personally by me on the following activities: development of treatment plan with patient and/or surrogate as well as nursing, discussions with consultants, evaluation of patient's response to treatment, examination of patient, obtaining history from patient or surrogate, ordering and performing treatments and interventions, ordering and review of laboratory studies, ordering and review of radiographic studies, pulse oximetry and re-evaluation of patient's condition.  MDM   Final diagnoses:  Hyperkalemia  Junctional bradycardia  Chest wall pain  End-stage renal disease on hemodialysis (HCC)  Anemia of renal disease  Subtherapeutic international normalized ratio (INR)  Elevated troponin I level    Chest pain which appears to be chest wall pain. Old records are reviewed and he has multiple ED visits for chest wall pain. Poor compliance with hemodialysis. Screening labs were obtained which did show hyperkalemia but there were no ECG changes of hyperkalemia. However, while in the emergency department, his heart rhythm suddenly changed to a junctional bradycardia. ECG was repeated documenting the rhythm change, but still not showing any tall and peaked T waves and still not showing any QRS widening. Still, the rhythm was felt to represent evidence of potassium toxicity and was treated aggressively with intravenous glucose, insulin, calcium, sodium bicarbonate and also nebulizer treatment with albuterol. Case was discussed with Dr. Justin Mend of nephrology service who agrees to dialyze [name] on an emergent basis. He requests patient  be admitted to the hospitalist service and case was discussed with Dr. Hal Hope who agrees to admit [name] to stepdown unit.  I personally performed the services described in this documentation, which was scribed in my presence. The recorded information has been reviewed and is accurate.      Delora Fuel, MD [id number]

## 2015-09-08 NOTE — Progress Notes (Signed)
Pt developed 8/10 chest pain.  NTG with minimal relief after 2- then morphine now improving.  Pain increased when lying flat. Has another troponin at 1630

## 2015-09-08 NOTE — Progress Notes (Signed)
Pt with much improved pain control at present, has refused BP mult times throughout shift, BP improved with last measurement, has also refused CBGs when tech tried to get them.  Edward Qualia RN

## 2015-09-08 NOTE — Progress Notes (Signed)
Pt complaining of chest pain accompanied by nausea,and anxiety, treated with prn zofran, xanax and hydrocodone, EKG performed and placed on chart,  MD notified, sublinqual nitroglycerin orders received and admin will continue to monitor closely.  Edward Qualia RN

## 2015-09-08 NOTE — Progress Notes (Addendum)
PATIENT DETAILS Name: Frank [name] Age: 50 y.o. Sex: male Date of Birth: 1966-04-29 Admit Date: 09/07/2015 Admitting Physician [name], MD PCP:[name] Patient  Subjective: Shortness of breath much better following dialysis. Intermittent chest pain continues.  Assessment/Plan: Principal Problem: Acute respiratory failure with hypoxia: Secondary to noncardiogenic pulmonary edema in the setting of nonadherence to hemodialysis. . Back on room air following hemodialysis. Follow.  Active Problems: Chronic systolic heart failure: Volume status improved after dialysis. Resume Coreg and lisinopril. Last EF by TTE on May 2016 around 35-40% Follow.  Uncontrolled hypertension: Slowly improving-secondary to missed hemodialysis. Continue to read, amlodipine, hydralazine, Imdur and lisinopril. Follow and adjust accordingly.  Chest pain: With both typical and atypical features. Troponins only minimally elevated. Cycle troponins, follow. Cardiology following as well  Hyperkalemia: Secondary to missed hemodialysis, resolved following dialysis. Follow  ESRD: Noncompliant-nephrology following, underwent emergent hemodialysis. Has history of failed renal transplant-continue low-dose maintenance prednisone.  Paroxysmal atrial fibrillation: Rate controlled with Coreg, INR subtherapeutic-? Compliance. Coumadin per pharmacy.  Anemia: Likely secondary to ESRD. Per nephrology. Repeat CBC tomorrow-may require PRBC transfusion  History of DVT/PE: Continue Coumadin-claims he has not missed any doses of his medications. INR subtherapeutic, pharmacy dosing Coumadin. Follow.  History of chronic pain/narcotic abuse: Supportive care for now.  Noncompliance: Counseled, he understands seriousness of missing dialysis-he understands life threatening/leg disabling consequences.  Disposition: Remain inpatient  Antimicrobial agents  See below  Anti-infectives    None      DVT  Prophylaxis: Coumadin  Code Status: Full code  Family Communication None at bedside  Procedures: Nond  CONSULTS:  nephrology  Time spent 30 minutes-Greater than 50% of this time was spent in counseling, explanation of diagnosis, planning of further management, and coordination of care.  MEDICATIONS: Scheduled Meds: . amLODipine  10 mg Oral Daily  . atorvastatin  40 mg Oral q1800  . carvedilol  25 mg Oral BID WC  . cinacalcet  30 mg Oral Q breakfast  . docusate sodium  100 mg Oral BID  . gabapentin  100 mg Oral TID  . hydrALAZINE  50 mg Oral 3 times per day  . insulin aspart  0-9 Units Subcutaneous TID WC  . isosorbide mononitrate  60 mg Oral Daily  . lisinopril  20 mg Oral Daily  . multivitamin  1 tablet Oral QHS  . pantoprazole  40 mg Oral Daily  . predniSONE  5 mg Oral Q breakfast  . sevelamer carbonate  1,600 mg Oral TID WC  . sodium chloride flush  3 mL Intravenous Q12H  . warfarin  10 mg Oral ONCE-1800  . Warfarin - Pharmacist Dosing Inpatient   Does not apply q1800   Continuous Infusions:  PRN Meds:.sodium chloride, sodium chloride, acetaminophen **OR** acetaminophen, ALPRAZolam, alteplase, heparin, hydrALAZINE, HYDROcodone-acetaminophen, levalbuterol, lidocaine (PF), lidocaine-prilocaine, morphine injection, nitroGLYCERIN, ondansetron **OR** ondansetron (ZOFRAN) IV, pentafluoroprop-tetrafluoroeth, sevelamer carbonate    PHYSICAL EXAM: Vital signs in last 24 hours: Filed Vitals:   09/08/15 0700 09/08/15 0730 09/08/15 0800 09/08/15 1252  BP: 194/131 207/132 197/136 180/103  Pulse: 84 84 81 82  Temp:   98.3 F (36.8 C)   TempSrc:   Oral   Resp:   20 20  Height:      Weight:      SpO2:        Weight change:  Filed Weights   09/08/15 0345 09/08/15 0428  Weight: 77.293 kg (  170 lb 6.4 oz) 77.2 kg (170 lb 3.1 oz)   Body mass index is 21.84 kg/(m^2).   Gen Exam: Awake and alert with clear speech.   Neck: Supple, No JVD.   Chest: B/L Clear.   CVS: S1  S2 Regular, +syst murmur.  Abdomen: soft, BS +, non tender, non distended.  Extremities: no edema, lower extremities warm to touch. Neurologic: Non Focal.   Skin: No Rash.   Wounds: N/A.   Intake/Output from previous day:  Intake/Output Summary (Last 24 hours) at 09/08/15 1508 Last data filed at 09/08/15 0800  Gross per 24 hour  Intake      0 ml  Output   3000 ml  Net  -3000 ml     LAB RESULTS: CBC  Recent Labs Lab 09/01/15 2339 09/07/15 2357 09/08/15 1012  WBC 6.4 5.8 4.7  HGB 8.2* 8.2* 7.8*  HCT 25.9* 25.5* 23.2*  PLT 216 174 168  MCV 86.9 85.6 85.3  MCH 27.5 27.5 28.7  MCHC 31.7 32.2 33.6  RDW 14.9 14.8 15.0    Chemistries   Recent Labs Lab 09/01/15 2339 09/07/15 2357 09/08/15 1012  NA 137 139 137  K 5.9* 6.5* 3.6  CL 97* 101 98*  CO2 _0 GLUCOSE 88 86 99  BUN 71* 76* 27*  CREATININE 13.83* 15.84* 7.37*  CALCIUM 8.8* 9.1 8.5*    CBG:  Recent Labs Lab 09/08/15 1136  GLUCAP 99    GFR Estimated Creatinine Clearance: 12.9 mL/min (by C-G formula based on Cr of 7.37).  Coagulation profile  Recent Labs Lab 09/08/15 0043 09/08/15 1012  INR 1.53* 1.51*    Cardiac Enzymes  Recent Labs Lab 09/08/15 1012  TROPONINI 0.06*    Invalid input(s): POCBNP No results for input(s): DDIMER in the last 72 hours. No results for input(s): HGBA1C in the last 72 hours. No results for input(s): CHOL, HDL, LDLCALC, TRIG, CHOLHDL, LDLDIRECT in the last 72 hours. No results for input(s): TSH, T4TOTAL, T3FREE, THYROIDAB in the last 72 hours.  Invalid input(s): FREET3 No results for input(s): VITAMINB12, FOLATE, FERRITIN, TIBC, IRON, RETICCTPCT in the last 72 hours. No results for input(s): LIPASE, AMYLASE in the last 72 hours.  Urine Studies No results for input(s): UHGB, CRYS in the last 72 hours.  Invalid input(s): UACOL, UAPR, USPG, UPH, UTP, UGL, UKET, UBIL, UNIT, UROB, ULEU, UEPI, UWBC, URBC, UBAC, CAST, UCOM, BILUA  MICROBIOLOGY: Recent  Results (from the past 240 hour(s))  MRSA PCR Screening     Status: None   Collection Time: 09/08/15  3:30 AM  Result Value Ref Range Status   MRSA by PCR NEGATIVE NEGATIVE Final    Comment:        The GeneXpert MRSA Assay (FDA approved for NASAL specimens only), is one component of a comprehensive MRSA colonization surveillance program. It is not intended to diagnose MRSA infection nor to guide or monitor treatment for MRSA infections.     RADIOLOGY STUDIES/RESULTS: Dg Chest 2 View  09/08/2015  CLINICAL DATA:  Chest pain radiating to the left arm. Shortness of breath. Nausea and vomiting beginning tonight. Cough and congestion. EXAM: CHEST  2 VIEW COMPARISON:  09/01/2015 FINDINGS: Right central venous catheter with tip over the cavoatrial junction/upper RA. Diffuse cardiac enlargement. Mild pulmonary vascular congestion. No edema or consolidation. No blunting of costophrenic angles. No pneumothorax. IMPRESSION: Cardiac enlargement with mild pulmonary vascular congestion suggesting fluid overload. No edema or consolidation. Electronically Signed   By: Oren Beckmann.D.  On: 09/08/2015 02:12   Dg Chest 2 View  09/02/2015  CLINICAL DATA:  50 year old male with shortness of breath and chest pain EXAM: CHEST  2 VIEW COMPARISON:  Radiograph dated 08/25/2015 FINDINGS: 50 year old catheter is noted with tip over the right atrium in stable positioning. There is stable cardiomegaly with mild vascular congestion. No focal consolidation, pleural effusion, or pneumothorax. No acute osseous pathology. IMPRESSION: No interval change. Electronically Signed   By: Anner Crete M.D.   On: 09/02/2015 00:02   Dg Chest 2 View  08/18/2015  CLINICAL DATA:  Chest pain and shortness of breath while at dialysis today. EXAM: CHEST  2 VIEW COMPARISON:  08/10/2015. FINDINGS: The cardio pericardial silhouette is enlarged. There is pulmonary vascular congestion without overt pulmonary edema. The lungs are clear  wiithout focal pneumonia, pneumothorax or pleural effusion. Right IJ dialysis catheter tip projects at the level of the mid right atrium. The visualized bony structures of the thorax are intact. IMPRESSION: Cardiomegaly with vascular congestion. Electronically Signed   By: Misty Stanley M.D.   On: 08/18/2015 15:57   Dg Chest 2 View  08/10/2015  CLINICAL DATA:  Shortness of breath.  Chronic renal failure EXAM: CHEST  2 VIEW COMPARISON:  July 17, 2015 FINDINGS: There is cardiomegaly with mild pulmonary venous hypertension. There is a minimal right effusion. No edema or consolidation. Central catheter tip is in the right atrium slightly beyond the cavoatrial junction. No pneumothorax. No adenopathy. IMPRESSION: Persistent pulmonary vascular congestion. No frank edema or consolidation. Minimal right effusion. No change and central catheter position. No pneumothorax. Electronically Signed   By: Lowella Grip III M.D.   On: 08/10/2015 09:16   Dg Chest Port 1 View  08/19/2015  CLINICAL DATA:  50 year old male with shortness of breath. End-stage renal disease. EXAM: PORTABLE CHEST 1 VIEW COMPARISON:  08/18/2015 FINDINGS: Cardiomegaly and pulmonary vascular congestion noted. Slightly increased left retrocardiac opacity may represent atelectasis. Trace bilateral pleural effusions are present. There is no evidence of pneumothorax. A right IJ central venous catheter is present with tip overlying the upper right atrium. IMPRESSION: Little significant change except for possible development of left lower lung atelectasis. Cardiomegaly, pulmonary vascular congestion and trace pleural effusions again noted. Electronically Signed   By: Margarette Canada M.D.   On: 08/19/2015 07:07   Dg Abd Acute W/chest  08/25/2015  CLINICAL DATA:  BILATERAL groin pain greater on RIGHT, recent hernia surgery 4 days ago, hypertension, diabetes mellitus, end- stage renal disease on dialysis, CHF, former smoker EXAM: DG ABDOMEN ACUTE W/ 1V  CHEST COMPARISON:  08/19/2015 chest radiograph, abdominal radiograph 02/11/2015 FINDINGS: RIGHT jugular dual-lumen central venous catheter with tip projecting over high RIGHT atrium. Enlargement of cardiac silhouette with pulmonary vascular congestion. Central peribronchial thickening with question minimal perihilar edema asymmetric on RIGHT. Remaining lungs clear. No definite pleural effusion or pneumothorax. Normal bowel gas pattern with scattered stool and gas throughout colon. No bowel dilatation, bowel wall thickening, or free intraperitoneal air. Osseous structures unremarkable. Scattered atherosclerotic calcifications. No definite urinary tract calcification. IMPRESSION: Enlargement of cardiac silhouette with pulmonary vascular congestion and question minimal asymmetric RIGHT perihilar edema. No acute abdominal findings. Electronically Signed   By: Lavonia Dana M.D.   On: 08/25/2015 14:37    Oren Binet, MD  [facility] Pager:[phone]  If 7PM-7AM, please contact night-coverage [email or web address] Password [email or web address] 09/08/2015, 3:08 PM   LOS: 0 days

## 2015-09-08 NOTE — Progress Notes (Signed)
Patient arrived to 3W-05 and attempted to place on the monitor but patient is being noncompliant and insisting on going into the bathroom and he will do his own CHG bath with the wipes and will put his own gown on. Gerald Stabs (Camera operator) was in the room with me when he was refusing certain care and that he would do it himself. Patient is demanding to be fed and I explained to him that there are no orders in the computer for a menu, will page his MD and await orders or a phone call. I was able to get his MRSA swab and will send down.

## 2015-09-08 NOTE — Progress Notes (Signed)
ANTICOAGULATION CONSULT NOTE - Initial Consult  Pharmacy Consult for Coumadin Indication: atrial fibrillation  Allergies  Allergen Reactions  . Butalbital-Apap-Caffeine Shortness Of Breath and Swelling    Swelling in throat  . Ferrlecit [Na Ferric Gluc Cplx In Sucrose] Shortness Of Breath, Swelling and Other (See Comments)    Swelling in throat  . Minoxidil Shortness Of Breath  . Darvocet [Propoxyphene N-Acetaminophen] Hives  . Propoxyphene Hives    Allergy listed on file from [facility] (Pt did not mention any allergies)     Patient Measurements: Height: _0  (188 cm) Weight: 170 lb 6.4 oz (77.293 kg) IBW/kg (Calculated) : 82.2  Vital Signs: Temp: 98.3 F (36.8 C) (02/17 0345) Temp Source: Oral (02/17 0345) BP: 187/132 mmHg (02/17 0345) Pulse Rate: 85 (02/17 0345)  Labs:  Recent Labs  09/07/15 2357 09/08/15 0043  HGB 8.2*  --   HCT 25.5*  --   PLT 174  --   LABPROT  --  18.4*  INR  --  1.53*  CREATININE 15.84*  --     Estimated Creatinine Clearance: 6 mL/min (by C-G formula based on Cr of 15.84).   Medical History: Past Medical History  Diagnosis Date  . Hypertension   . Depression   . Complication of anesthesia     itching, sore throat  . Diabetes mellitus without complication ([place])     No history per patient, but remains under history as A1c would not be accurate given on dialysis  . Shortness of breath   . Anxiety   . ESRD (end stage renal disease) ([place])     due to HTN per patient, followed at [facility], s/p failed kidney transplant - dialysis Tue, Th, Sat  . Renal insufficiency   . CHF (congestive heart failure) ([place])   . Anemia   . Dialysis patient [facility])     Medications:  Prescriptions prior to admission  Medication Sig Dispense Refill Last Dose  . ALPRAZolam (XANAX) 0.5 MG tablet Take 0.5 mg by mouth 2 (two) times daily as needed for anxiety.   Past Week at Unknown time  . amLODipine (NORVASC) 10 MG tablet Take 10 mg by mouth daily.    09/07/2015 at Unknown time  . atorvastatin (LIPITOR) 40 MG tablet Take 1 tablet (40 mg total) by mouth daily at 6 PM. 30 tablet 1 09/07/2015 at Unknown time  . carvedilol (COREG) 25 MG tablet Take 1 tablet (25 mg total) by mouth 2 (two) times daily with a meal.   09/07/2015 at 1800  . cinacalcet (SENSIPAR) 30 MG tablet Take 30 mg by mouth daily.   09/07/2015 at Unknown time  . docusate sodium (COLACE) 100 MG capsule Take 1 capsule (100 mg total) by mouth 2 (two) times daily. 60 capsule 0 09/07/2015 at Unknown time  . doxercalciferol (HECTOROL) 4 MCG/2ML injection Inject 1.25 mLs (2.5 mcg total) into the vein Every Tuesday,Thursday,and Saturday with dialysis. 2 mL  Past Week at Unknown time  . gabapentin (NEURONTIN) 100 MG capsule Take 100 mg by mouth 3 (three) times daily.   09/07/2015 at Unknown time  . hydrALAZINE (APRESOLINE) 50 MG tablet Take 1 tablet (50 mg total) by mouth every 8 (eight) hours. 90 tablet 1 09/07/2015 at Unknown time  . hydrocerin (EUCERIN) CREA Apply 1 application topically 2 (two) times daily. 454 g 0 09/07/2015 at Unknown time  . HYDROcodone-acetaminophen (NORCO/VICODIN) 5-325 MG tablet Take 1 tablet by mouth every 6 (six) hours as needed. 6 tablet 0 09/07/2015 at Unknown  time  . isosorbide mononitrate (IMDUR) 60 MG 24 hr tablet Take 60 mg by mouth daily.   09/07/2015 at Unknown time  . levalbuterol (XOPENEX HFA) 45 MCG/ACT inhaler Inhale 2 puffs into the lungs every 6 (six) hours as needed for wheezing or shortness of breath. 1 Inhaler 2 09/07/2015 at Unknown time  . lisinopril (PRINIVIL,ZESTRIL) 20 MG tablet Take 1 tablet (20 mg total) by mouth daily. 30 tablet 1 09/07/2015 at Unknown time  . multivitamin (RENA-VIT) TABS tablet Take 1 tablet by mouth at bedtime. 30 tablet 1 09/07/2015 at Unknown time  . omeprazole (PRILOSEC) 20 MG capsule Take 20 mg by mouth daily.   09/07/2015 at Unknown time  . predniSONE (DELTASONE) 5 MG tablet Take 5 mg by mouth daily with breakfast.   09/07/2015 at  Unknown time  . sevelamer carbonate (RENVELA) 800 MG tablet Take 800-1,600 mg by mouth 3 (three) times daily with meals. 2 tabs three times daily with meals, and 1 tablet with snacks   09/07/2015 at Unknown time  . warfarin (COUMADIN) 6 MG tablet Take 1 tablet (6 mg total) by mouth daily at 6 PM. Take daily until adjusted per MD (Patient taking differently: Take 10 mg by mouth daily at 6 PM. Take daily until adjusted per MD) 20 tablet 0 09/06/2015    Assessment: 50 y.o. male admitted with chest pain, h/o Afib, to continue Coumadin  Goal of Therapy:  INR 2-3 Monitor platelets by anticoagulation protocol: Yes   Plan:  Coumadin 10 mg today Daily INR  Caryl Pina 09/08/2015,4:09 AM

## 2015-09-08 NOTE — Progress Notes (Signed)
Utilization review completed. Arrionna Serena, RN, BSN. 

## 2015-09-08 NOTE — Progress Notes (Signed)
Report received via phone from [facility], reviewed chart, orders, labs, VS, meds, EKG and patient's general condition, will assume care when patient arrives  on unit.

## 2015-09-08 NOTE — ED Notes (Signed)
Pt noted to be bradycardic on cardiac monitor; repeat ekg completed and given to [facility].

## 2015-09-09 ENCOUNTER — Inpatient Hospital Stay (HOSPITAL_COMMUNITY): Payer: Medicare Other

## 2015-09-09 DIAGNOSIS — R079 Chest pain, unspecified: Secondary | ICD-10-CM

## 2015-09-09 DIAGNOSIS — E1122 Type 2 diabetes mellitus with diabetic chronic kidney disease: Secondary | ICD-10-CM

## 2015-09-09 DIAGNOSIS — Z992 Dependence on renal dialysis: Secondary | ICD-10-CM

## 2015-09-09 DIAGNOSIS — E1165 Type 2 diabetes mellitus with hyperglycemia: Secondary | ICD-10-CM

## 2015-09-09 DIAGNOSIS — N186 End stage renal disease: Secondary | ICD-10-CM

## 2015-09-09 LAB — GLUCOSE, CAPILLARY
GLUCOSE-CAPILLARY: 117 mg/dL — AB (ref 65–99)
Glucose-Capillary: 78 mg/dL (ref 65–99)
Glucose-Capillary: 134 mg/dL — ABNORMAL HIGH (ref 65–99)

## 2015-09-09 LAB — HEMOGLOBIN AND HEMATOCRIT, BLOOD
HCT: 21.7 % — ABNORMAL LOW (ref 39.0–52.0)
HEMOGLOBIN: 7 g/dL — AB (ref 13.0–17.0)

## 2015-09-09 LAB — CBC
HEMATOCRIT: 22.3 % — AB (ref 39.0–52.0)
HEMATOCRIT: 26.7 % — AB (ref 39.0–52.0)
HEMOGLOBIN: 7.2 g/dL — AB (ref 13.0–17.0)
HEMOGLOBIN: 8.6 g/dL — AB (ref 13.0–17.0)
MCH: 27.7 pg (ref 26.0–34.0)
MCH: 27.9 pg (ref 26.0–34.0)
MCHC: 32.3 g/dL (ref 30.0–36.0)
MCHC: 32.2 g/dL (ref 30.0–36.0)
MCV: 85.8 fL (ref 78.0–100.0)
MCV: 86.7 fL (ref 78.0–100.0)
Platelets: 163 10*3/uL (ref 150–400)
Platelets: 144 10*3/uL — ABNORMAL LOW (ref 150–400)
RBC: 2.6 MIL/uL — ABNORMAL LOW (ref 4.22–5.81)
RBC: 3.08 MIL/uL — ABNORMAL LOW (ref 4.22–5.81)
RDW: 14.9 % (ref 11.5–15.5)
RDW: 14.7 % (ref 11.5–15.5)
WBC: 4.9 10*3/uL (ref 4.0–10.5)
WBC: 6.8 10*3/uL (ref 4.0–10.5)

## 2015-09-09 LAB — PREPARE RBC (CROSSMATCH)

## 2015-09-09 LAB — RENAL FUNCTION PANEL
ALBUMIN: 2.4 g/dL — AB (ref 3.5–5.0)
ANION GAP: 10 (ref 5–15)
BUN: 39 mg/dL — ABNORMAL HIGH (ref 6–20)
CALCIUM: 8.2 mg/dL — AB (ref 8.9–10.3)
CO2: 27 mmol/L (ref 22–32)
Chloride: 99 mmol/L — ABNORMAL LOW (ref 101–111)
Creatinine, Ser: 9.68 mg/dL — ABNORMAL HIGH (ref 0.61–1.24)
GFR calc Af Amer: 6 mL/min — ABNORMAL LOW (ref >60)
GFR, EST NON AFRICAN AMERICAN: 5 mL/min — AB (ref >60)
GLUCOSE: 89 mg/dL (ref 65–99)
PHOSPHORUS: 4.8 mg/dL — AB (ref 2.5–4.6)
Potassium: 5.1 mmol/L (ref 3.5–5.1)
Sodium: 136 mmol/L (ref 135–145)

## 2015-09-09 LAB — PROTIME-INR
INR: 1.66 — ABNORMAL HIGH (ref 0.00–1.49)
Prothrombin Time: 19.6 seconds — ABNORMAL HIGH (ref 11.6–15.2)

## 2015-09-09 MED ORDER — SODIUM CHLORIDE 0.9 % IV SOLN: Freq: Once | INTRAVENOUS | Status: DC

## 2015-09-09 MED ORDER — ACETAMINOPHEN 325 MG PO TABS: 650.0000 mg | ORAL_TABLET | Freq: Once | ORAL | Status: DC

## 2015-09-09 MED ORDER — DIPHENHYDRAMINE HCL 50 MG/ML IJ SOLN: 25.0000 mg | Freq: Once | INTRAMUSCULAR | Status: DC

## 2015-09-09 MED ORDER — ACETAMINOPHEN 325 MG PO TABS
ORAL_TABLET | ORAL | Status: AC
  Filled 2015-09-09: qty 2

## 2015-09-09 MED ORDER — MORPHINE SULFATE (PF) 2 MG/ML IV SOLN
INTRAVENOUS | Status: AC
  Filled 2015-09-09: qty 1

## 2015-09-09 MED ORDER — HYDRALAZINE HCL 20 MG/ML IJ SOLN
INTRAMUSCULAR | Status: AC
  Filled 2015-09-09: qty 1

## 2015-09-09 MED ORDER — WARFARIN SODIUM 10 MG PO TABS
10.0000 mg | ORAL_TABLET | Freq: Once | ORAL | Status: AC
  Administered 2015-09-09: 10 mg via ORAL
  Filled 2015-09-09 (×2): qty 1

## 2015-09-09 NOTE — Procedures (Signed)
Tol HD Extra volume being removed.  He will need to go back to his home dialysis unit for dialysis Monday.  His schedule is MWFS. Amelia Macken C

## 2015-09-09 NOTE — Plan of Care (Signed)
Problem: Education: Goal: Knowledge of [facility] Education information/materials will improve Outcome: Progressing Patient is more alert and cooperative today and able to teach and accept information, will continue to monitor

## 2015-09-09 NOTE — Plan of Care (Signed)
Problem: Safety: Goal: Ability to remain free from injury will improve Outcome: Progressing Patient willing to let us put the bed alarm on today and has been using the call light appropriately but is now more compliant with the policies and rules for safety, will continue to monitor

## 2015-09-09 NOTE — Progress Notes (Signed)
ANTICOAGULATION CONSULT NOTE - Initial Consult  Pharmacy Consult for Coumadin Indication: atrial fibrillation  Allergies  Allergen Reactions  . Butalbital-Apap-Caffeine Shortness Of Breath and Swelling    Swelling in throat  . Ferrlecit [Na Ferric Gluc Cplx In Sucrose] Shortness Of Breath, Swelling and Other (See Comments)    Swelling in throat  . Minoxidil Shortness Of Breath  . Darvocet [Propoxyphene N-Acetaminophen] Hives  . Propoxyphene Hives    Allergy listed on file from [facility] (Pt did not mention any allergies)     Patient Measurements: Height: _0  (188 cm) Weight: 163 lb 12.8 oz (74.299 kg) IBW/kg (Calculated) : 82.2  Vital Signs: Temp: 97.9 F (36.6 C) (02/18 0700) Temp Source: Oral (02/18 0700) BP: 165/100 mmHg (02/18 0720) Pulse Rate: 81 (02/18 0720)  Labs:  Recent Labs  09/08/15 0043 09/08/15 1012 09/08/15 1549 09/08/15 2131 09/09/15 0357  HGB  --  7.8*  --  7.6* 7.2*  HCT  --  23.2*  --  23.3* 22.3*  PLT  --  168  --  170 163  LABPROT 18.4* 18.3*  --   --  19.6*  INR 1.53* 1.51*  --   --  1.66*  CREATININE  --  7.37*  --  9.02* 9.68*  TROPONINI  --  0.06* 0.04* 0.05*  --     Estimated Creatinine Clearance: 9.5 mL/min (by C-G formula based on Cr of 9.68).   Medical History: Past Medical History  Diagnosis Date  . Hypertension   . Depression   . Complication of anesthesia     itching, sore throat  . Diabetes mellitus without complication ([place])     No history per patient, but remains under history as A1c would not be accurate given on dialysis  . Shortness of breath   . Anxiety   . ESRD (end stage renal disease) ([place])     due to HTN per patient, followed at [facility], s/p failed kidney transplant - dialysis Tue, Th, Sat  . Renal insufficiency   . CHF (congestive heart failure) ([place])   . Anemia   . Dialysis patient [facility])     Medications:  Prescriptions prior to admission  Medication Sig Dispense Refill Last Dose  . ALPRAZolam  (XANAX) 0.5 MG tablet Take 0.5 mg by mouth 2 (two) times daily as needed for anxiety.   Past Week at Unknown time  . amLODipine (NORVASC) 10 MG tablet Take 10 mg by mouth daily.   09/07/2015 at Unknown time  . atorvastatin (LIPITOR) 40 MG tablet Take 1 tablet (40 mg total) by mouth daily at 6 PM. 30 tablet 1 09/07/2015 at Unknown time  . carvedilol (COREG) 25 MG tablet Take 1 tablet (25 mg total) by mouth 2 (two) times daily with a meal.   09/07/2015 at 1800  . cinacalcet (SENSIPAR) 30 MG tablet Take 30 mg by mouth daily.   09/07/2015 at Unknown time  . docusate sodium (COLACE) 100 MG capsule Take 1 capsule (100 mg total) by mouth 2 (two) times daily. 60 capsule 0 09/07/2015 at Unknown time  . doxercalciferol (HECTOROL) 4 MCG/2ML injection Inject 1.25 mLs (2.5 mcg total) into the vein Every Tuesday,Thursday,and Saturday with dialysis. 2 mL  Past Week at Unknown time  . gabapentin (NEURONTIN) 100 MG capsule Take 100 mg by mouth 3 (three) times daily.   09/07/2015 at Unknown time  . hydrALAZINE (APRESOLINE) 50 MG tablet Take 1 tablet (50 mg total) by mouth every 8 (eight) hours. 90 tablet 1 09/07/2015  at Unknown time  . hydrocerin (EUCERIN) CREA Apply 1 application topically 2 (two) times daily. 454 g 0 09/07/2015 at Unknown time  . HYDROcodone-acetaminophen (NORCO/VICODIN) 5-325 MG tablet Take 1 tablet by mouth every 6 (six) hours as needed. 6 tablet 0 09/07/2015 at Unknown time  . isosorbide mononitrate (IMDUR) 60 MG 24 hr tablet Take 60 mg by mouth daily.   09/07/2015 at Unknown time  . levalbuterol (XOPENEX HFA) 45 MCG/ACT inhaler Inhale 2 puffs into the lungs every 6 (six) hours as needed for wheezing or shortness of breath. 1 Inhaler 2 09/07/2015 at Unknown time  . lisinopril (PRINIVIL,ZESTRIL) 20 MG tablet Take 1 tablet (20 mg total) by mouth daily. 30 tablet 1 09/07/2015 at Unknown time  . multivitamin (RENA-VIT) TABS tablet Take 1 tablet by mouth at bedtime. 30 tablet 1 09/07/2015 at Unknown time  .  omeprazole (PRILOSEC) 20 MG capsule Take 20 mg by mouth daily.   09/07/2015 at Unknown time  . predniSONE (DELTASONE) 5 MG tablet Take 5 mg by mouth daily with breakfast.   09/07/2015 at Unknown time  . sevelamer carbonate (RENVELA) 800 MG tablet Take 800-1,600 mg by mouth 3 (three) times daily with meals. 2 tabs three times daily with meals, and 1 tablet with snacks   09/07/2015 at Unknown time  . warfarin (COUMADIN) 6 MG tablet Take 1 tablet (6 mg total) by mouth daily at 6 PM. Take daily until adjusted per MD (Patient taking differently: Take 10 mg by mouth daily at 6 PM. Take daily until adjusted per MD) 20 tablet 0 09/06/2015    Assessment: 50 y.o. male admitted with chest pain, h/o Afib, to continue Coumadin INR subtherapeutic at 1.66, Hgb 7.2, Plts 163, no s/sx bleeding noted.  INR on admission may be 2/2 hx of noncompliance.  - PTA Coumadin 10 mg daily  Goal of Therapy:  INR 2-3 Monitor platelets by anticoagulation protocol: Yes   Plan:  Coumadin 10 mg x 1 today INR daily, CBC Q72h Monitor for s/sx of bleeding   Kelsy E Combs 09/09/2015,8:21 AM

## 2015-09-09 NOTE — Progress Notes (Signed)
PATIENT DETAILS Name: Frank [name] Age: 50 y.o. Sex: male Date of Birth: 03-17-1966 Admit Date: 09/07/2015 Admitting Physician [name], MD PCP:[name] Patient  Brief narrative: 50 year old male history of ESRD-noncompliant with outpatient dialysis-presented with acute respiratory failure, hyperkalemia in junctional rhythm. Underwent emergent dialysis, hospital course now complicated by intermittent chest pain, and anemia requiring PRBC transfusion. See below for further details  Subjective: Shortness of breath much better following dialysis. Intermittent chest pain continues.  Assessment/Plan: Principal Problem: Acute respiratory failure with hypoxia: Secondary to noncardiogenic pulmonary edema in the setting of nonadherence to hemodialysis.  Back on room air following hemodialysis. Follow.  Active Problems: Chronic systolic heart failure: Volume status improved after dialysis. Continue Coreg and lisinopril. Last EF by TTE on May 2016 around 35-40% Follow.  Junctional rhythm: Resolved, secondary to hyperkalemia.  Uncontrolled hypertension: Improved-secondary to missed hemodialysis. Continue Coreg, amlodipine, hydralazine, Imdur and lisinopril. Follow and adjust accordingly.  Chest pain: Continues to have intermittent chest pain- both typical and atypical features. Troponins only minimally elevated. Cycle troponins, follow. Cardiology following as well-await further recommendations from cardiology  Hyperkalemia: Secondary to missed hemodialysis, resolved following dialysis. Follow  ESRD: Noncompliant-nephrology following, underwent emergent hemodialysis on 2/17, and will undergo another dialysis on 2/18. Has history of failed renal transplant-continue low-dose maintenance prednisone. Unfortunately, patient is noncompliant to outpatient dialysis-he apparently has a outpatient dialysis center in [place] he supposed to go on Monday, Wednesday and  Friday. He claims he does not have transportation to get there.   Paroxysmal atrial fibrillation: Rate controlled with Coreg, INR subtherapeutic-? Compliance. Coumadin per pharmacy.  Anemia: Likely secondary to ESRD. Will transfuse 1 unit of PRBC with dialysis today. Repeat CBC tomorrow.  History of DVT/PE: Continue Coumadin-claims he has not missed any doses of his medications. INR subtherapeutic, pharmacy dosing Coumadin. Follow.  History of chronic pain/narcotic abuse: Supportive care for now.  Noncompliance: Counseled, he understands seriousness of missing dialysis-he understands life threatening/leg disabling consequences.  Disposition: Remain inpatient-suspect home 2/19.  Antimicrobial agents  See below  Anti-infectives    None      DVT Prophylaxis: Coumadin  Code Status: Full code  Family Communication None at bedside  Procedures: None  CONSULTS:  nephrology  Time spent 30 minutes-Greater than 50% of this time was spent in counseling, explanation of diagnosis, planning of further management, and coordination of care.  MEDICATIONS: Scheduled Meds: . sodium chloride   Intravenous Once  . acetaminophen      . acetaminophen  650 mg Oral Once  . amLODipine  10 mg Oral Daily  . atorvastatin  40 mg Oral q1800  . carvedilol  25 mg Oral BID WC  . cinacalcet  30 mg Oral Q breakfast  . diphenhydrAMINE  25 mg Intravenous Once  . docusate sodium  100 mg Oral BID  . gabapentin  100 mg Oral TID  . hydrALAZINE      . hydrALAZINE  50 mg Oral 3 times per day  . insulin aspart  0-9 Units Subcutaneous TID WC  . isosorbide mononitrate  60 mg Oral Daily  . lisinopril  20 mg Oral Daily  . morphine      . multivitamin  1 tablet Oral QHS  . pantoprazole  40 mg Oral Daily  . predniSONE  5 mg Oral Q breakfast  . sevelamer carbonate  1,600 mg Oral TID WC  . sodium chloride flush  3  mL Intravenous Q12H  . warfarin  10 mg Oral ONCE-1800  . Warfarin - Pharmacist Dosing  Inpatient   Does not apply q1800   Continuous Infusions:  PRN Meds:.sodium chloride, sodium chloride, acetaminophen **OR** acetaminophen, ALPRAZolam, hydrALAZINE, HYDROcodone-acetaminophen, levalbuterol, morphine injection, nitroGLYCERIN, ondansetron **OR** ondansetron (ZOFRAN) IV, sevelamer carbonate    PHYSICAL EXAM: Vital signs in last 24 hours: Filed Vitals:   09/09/15 1030 09/09/15 1044 09/09/15 1100 09/09/15 1115  BP: 172/105 170/110 179/115 160/100  Pulse: 82 79 79 80  Temp:  98 F (36.7 C)  98 F (36.7 C)  TempSrc:  Oral  Oral  Resp: _0 Height:      Weight:    71.5 kg (157 lb 10.1 oz)  SpO2:    98%    Weight change: -3.093 kg (-6 lb 13.1 oz) Filed Weights   09/09/15 0400 09/09/15 0700 09/09/15 1115  Weight: 74.299 kg (163 lb 12.8 oz) 74.5 kg (164 lb 3.9 oz) 71.5 kg (157 lb 10.1 oz)   Body mass index is 20.23 kg/(m^2).   Gen Exam: Awake and alert with clear speech.   Neck: Supple, No JVD.   Chest: B/L Clear.   CVS: S1 S2 Regular, +syst murmur.  Abdomen: soft, BS +, non tender, non distended.  Extremities: no edema, lower extremities warm to touch. Neurologic: Non Focal.   Skin: No Rash.   Wounds: N/A.   Intake/Output from previous day:  Intake/Output Summary (Last 24 hours) at 09/09/15 1212 Last data filed at 09/09/15 1115  Gross per 24 hour  Intake    578 ml  Output   3015 ml  Net  -2437 ml     LAB RESULTS: CBC  Recent Labs Lab 09/07/15 2357 09/08/15 1012 09/08/15 2131 09/09/15 0357 09/09/15 0753  WBC 5.8 4.7 4.5 4.9  --   HGB 8.2* 7.8* 7.6* 7.2* 7.0*  HCT 25.5* 23.2* 23.3* 22.3* 21.7*  PLT 174 168 170 163  --   MCV 85.6 85.3 85.7 85.8  --   MCH 27.5 28.7 27.9 27.7  --   MCHC 32.2 33.6 32.6 32.3  --   RDW 14.8 15.0 14.9 14.9  --     Chemistries   Recent Labs Lab 09/07/15 2357 09/08/15 1012 09/08/15 2131 09/09/15 0357  NA 139 137 134* 136  K 6.5* 3.6 5.0 5.1  CL 101 98* 99* 99*  CO2 _1 GLUCOSE 86 99 147* 89    BUN 76* 27* 34* 39*  CREATININE 15.84* 7.37* 9.02* 9.68*  CALCIUM 9.1 8.5* 8.3* 8.2*    CBG:  Recent Labs Lab 09/08/15 1136 09/08/15 1650 09/08/15 2130  GLUCAP 99 95 135*    GFR Estimated Creatinine Clearance: 9.1 mL/min (by C-G formula based on Cr of 9.68).  Coagulation profile  Recent Labs Lab 09/08/15 0043 09/08/15 1012 09/09/15 0357  INR 1.53* 1.51* 1.66*    Cardiac Enzymes  Recent Labs Lab 09/08/15 1012 09/08/15 1549 09/08/15 2131  TROPONINI 0.06* 0.04* 0.05*    Invalid input(s): POCBNP No results for input(s): DDIMER in the last 72 hours. No results for input(s): HGBA1C in the last 72 hours. No results for input(s): CHOL, HDL, LDLCALC, TRIG, CHOLHDL, LDLDIRECT in the last 72 hours. No results for input(s): TSH, T4TOTAL, T3FREE, THYROIDAB in the last 72 hours.  Invalid input(s): FREET3 No results for input(s): VITAMINB12, FOLATE, FERRITIN, TIBC, IRON, RETICCTPCT in the last 72 hours. No results for input(s): LIPASE, AMYLASE in the last 72 hours.  Urine Studies No results for input(s): UHGB, CRYS in the last 72 hours.  Invalid input(s): UACOL, UAPR, USPG, UPH, UTP, UGL, UKET, UBIL, UNIT, UROB, ULEU, UEPI, UWBC, URBC, UBAC, CAST, UCOM, BILUA  MICROBIOLOGY: Recent Results (from the past 240 hour(s))  MRSA PCR Screening     Status: None   Collection Time: 09/08/15  3:30 AM  Result Value Ref Range Status   MRSA by PCR NEGATIVE NEGATIVE Final    Comment:        The GeneXpert MRSA Assay (FDA approved for NASAL specimens only), is one component of a comprehensive MRSA colonization surveillance program. It is not intended to diagnose MRSA infection nor to guide or monitor treatment for MRSA infections.     RADIOLOGY STUDIES/RESULTS: Dg Chest 2 View  09/08/2015  CLINICAL DATA:  Chest pain radiating to the left arm. Shortness of breath. Nausea and vomiting beginning tonight. Cough and congestion. EXAM: CHEST  2 VIEW COMPARISON:  09/01/2015  FINDINGS: Right central venous catheter with tip over the cavoatrial junction/upper RA. Diffuse cardiac enlargement. Mild pulmonary vascular congestion. No edema or consolidation. No blunting of costophrenic angles. No pneumothorax. IMPRESSION: Cardiac enlargement with mild pulmonary vascular congestion suggesting fluid overload. No edema or consolidation. Electronically Signed   By: Lucienne Capers M.D.   On: 09/08/2015 02:12   Dg Chest 2 View  09/02/2015  CLINICAL DATA:  50 year old male with shortness of breath and chest pain EXAM: CHEST  2 VIEW COMPARISON:  Radiograph dated 08/25/2015 FINDINGS: 50 year old catheter is noted with tip over the right atrium in stable positioning. There is stable cardiomegaly with mild vascular congestion. No focal consolidation, pleural effusion, or pneumothorax. No acute osseous pathology. IMPRESSION: No interval change. Electronically Signed   By: Anner Crete M.D.   On: 09/02/2015 00:02   Dg Chest 2 View  08/18/2015  CLINICAL DATA:  Chest pain and shortness of breath while at dialysis today. EXAM: CHEST  2 VIEW COMPARISON:  08/10/2015. FINDINGS: The cardio pericardial silhouette is enlarged. There is pulmonary vascular congestion without overt pulmonary edema. The lungs are clear wiithout focal pneumonia, pneumothorax or pleural effusion. Right IJ dialysis catheter tip projects at the level of the mid right atrium. The visualized bony structures of the thorax are intact. IMPRESSION: Cardiomegaly with vascular congestion. Electronically Signed   By: Misty Stanley M.D.   On: 08/18/2015 15:57   Dg Chest Port 1 View  08/19/2015  CLINICAL DATA:  50 year old male with shortness of breath. End-stage renal disease. EXAM: PORTABLE CHEST 1 VIEW COMPARISON:  08/18/2015 FINDINGS: Cardiomegaly and pulmonary vascular congestion noted. Slightly increased left retrocardiac opacity may represent atelectasis. Trace bilateral pleural effusions are present. There is no evidence of  pneumothorax. A right IJ central venous catheter is present with tip overlying the upper right atrium. IMPRESSION: Little significant change except for possible development of left lower lung atelectasis. Cardiomegaly, pulmonary vascular congestion and trace pleural effusions again noted. Electronically Signed   By: Margarette Canada M.D.   On: 08/19/2015 07:07   Dg Abd Acute W/chest  08/25/2015  CLINICAL DATA:  BILATERAL groin pain greater on RIGHT, recent hernia surgery 4 days ago, hypertension, diabetes mellitus, end- stage renal disease on dialysis, CHF, former smoker EXAM: DG ABDOMEN ACUTE W/ 1V CHEST COMPARISON:  08/19/2015 chest radiograph, abdominal radiograph 02/11/2015 FINDINGS: RIGHT jugular dual-lumen central venous catheter with tip projecting over high RIGHT atrium. Enlargement of cardiac silhouette with pulmonary vascular congestion. Central peribronchial thickening with question minimal perihilar edema asymmetric on RIGHT.  Remaining lungs clear. No definite pleural effusion or pneumothorax. Normal bowel gas pattern with scattered stool and gas throughout colon. No bowel dilatation, bowel wall thickening, or free intraperitoneal air. Osseous structures unremarkable. Scattered atherosclerotic calcifications. No definite urinary tract calcification. IMPRESSION: Enlargement of cardiac silhouette with pulmonary vascular congestion and question minimal asymmetric RIGHT perihilar edema. No acute abdominal findings. Electronically Signed   By: Lavonia Dana M.D.   On: 08/25/2015 14:37    Oren Binet, MD  [facility] Pager:[phone]  If 7PM-7AM, please contact night-coverage [email or web address] Password [email or web address] 09/09/2015, 12:12 PM   LOS: 1 day

## 2015-09-09 NOTE — Plan of Care (Signed)
Problem: Pain Managment: Goal: General experience of comfort will improve Outcome: Progressing Patient was ordered morphine 1 mg every 4 hours and is asking for it as he needs it, he also knows that he has Vicodan to assist in keeping pain at a tolerable level, will continue to monitor

## 2015-09-09 NOTE — Plan of Care (Signed)
Problem: Skin Integrity: Goal: Risk for impaired skin integrity will decrease Outcome: Completed/Met Date Met:  09/09/15 Patient's skin is a little dry and flaky but is intact

## 2015-09-09 NOTE — Progress Notes (Signed)
Echocardiogram 2D Echocardiogram has been performed.  Frank [name] 09/09/2015, 2:17 PM

## 2015-09-09 NOTE — Progress Notes (Signed)
Updated report received in patient's room via [facility] RN using SBAR format, reviewed VS, new orders, meds and today's events, assumed care of patient.

## 2015-09-10 DIAGNOSIS — J9601 Acute respiratory failure with hypoxia: Secondary | ICD-10-CM

## 2015-09-10 LAB — RENAL FUNCTION PANEL
Albumin: 2.6 g/dL — ABNORMAL LOW (ref 3.5–5.0)
Anion gap: 9 (ref 5–15)
BUN: 25 mg/dL — AB (ref 6–20)
CALCIUM: 8.1 mg/dL — AB (ref 8.9–10.3)
CHLORIDE: 101 mmol/L (ref 101–111)
CO2: 27 mmol/L (ref 22–32)
CREATININE: 6.68 mg/dL — AB (ref 0.61–1.24)
GFR calc non Af Amer: 9 mL/min — ABNORMAL LOW (ref >60)
GFR, EST AFRICAN AMERICAN: 10 mL/min — AB (ref >60)
GLUCOSE: 80 mg/dL (ref 65–99)
Phosphorus: 3.8 mg/dL (ref 2.5–4.6)
Potassium: 4.5 mmol/L (ref 3.5–5.1)
SODIUM: 137 mmol/L (ref 135–145)

## 2015-09-10 LAB — CBC
HCT: 27.1 % — ABNORMAL LOW (ref 39.0–52.0)
Hemoglobin: 8.6 g/dL — ABNORMAL LOW (ref 13.0–17.0)
MCH: 27.6 pg (ref 26.0–34.0)
MCHC: 31.7 g/dL (ref 30.0–36.0)
MCV: 86.9 fL (ref 78.0–100.0)
PLATELETS: 163 10*3/uL (ref 150–400)
RBC: 3.12 MIL/uL — AB (ref 4.22–5.81)
RDW: 14.8 % (ref 11.5–15.5)
WBC: 6.3 10*3/uL (ref 4.0–10.5)

## 2015-09-10 LAB — TYPE AND SCREEN
ABO/RH(D): B POS
Antibody Screen: NEGATIVE
Unit division: 0

## 2015-09-10 LAB — GLUCOSE, CAPILLARY
Glucose-Capillary: 116 mg/dL — ABNORMAL HIGH (ref 65–99)
Glucose-Capillary: 91 mg/dL (ref 65–99)
Glucose-Capillary: 102 mg/dL — ABNORMAL HIGH (ref 65–99)
Glucose-Capillary: 115 mg/dL — ABNORMAL HIGH (ref 65–99)

## 2015-09-10 LAB — PROTIME-INR
INR: 2.12 — AB (ref 0.00–1.49)
PROTHROMBIN TIME: 23.6 s — AB (ref 11.6–15.2)

## 2015-09-10 MED ORDER — HYDRALAZINE HCL 50 MG PO TABS
100.0000 mg | ORAL_TABLET | Freq: Three times a day (TID) | ORAL | Status: DC
  Administered 2015-09-10 – 2015-09-11 (×3): 100 mg via ORAL
  Filled 2015-09-10 (×4): qty 2

## 2015-09-10 MED ORDER — ISOSORBIDE MONONITRATE ER 60 MG PO TB24
90.0000 mg | ORAL_TABLET | Freq: Every day | ORAL | Status: DC
  Administered 2015-09-11: 90 mg via ORAL
  Filled 2015-09-10: qty 1

## 2015-09-10 MED ORDER — LISINOPRIL 40 MG PO TABS
40.0000 mg | ORAL_TABLET | Freq: Every day | ORAL | Status: DC
  Administered 2015-09-11: 40 mg via ORAL
  Filled 2015-09-10: qty 1

## 2015-09-10 MED ORDER — WARFARIN SODIUM 5 MG PO TABS
5.0000 mg | ORAL_TABLET | Freq: Once | ORAL | Status: AC
Start: 2015-09-10 — End: 2015-09-10
  Administered 2015-09-10: 5 mg via ORAL
  Filled 2015-09-10: qty 1

## 2015-09-10 MED ORDER — ISOSORB DINITRATE-HYDRALAZINE 20-37.5 MG PO TABS
1.0000 | ORAL_TABLET | Freq: Three times a day (TID) | ORAL | Status: DC
Start: 2015-09-10 — End: 2015-09-10

## 2015-09-10 MED ORDER — OXYCODONE-ACETAMINOPHEN 5-325 MG PO TABS
1.0000 | ORAL_TABLET | Freq: Three times a day (TID) | ORAL | Status: DC | PRN
  Administered 2015-09-11: 1 via ORAL

## 2015-09-10 NOTE — Progress Notes (Signed)
Report received via [facility] RN in patient's room using SBAR format, updated on new orders, VS and events of the day, will assume care of the patient.

## 2015-09-10 NOTE — Progress Notes (Signed)
PATIENT DETAILS Name: Frank [name] Age: 50 y.o. Sex: male Date of Birth: 11-24-1965 Admit Date: 09/07/2015 Admitting Physician [name], MD PCP:[name] Patient  Brief narrative: 50 year old male history of ESRD-noncompliant with outpatient dialysis-presented with acute respiratory failure, hyperkalemia in junctional rhythm. Underwent emergent dialysis, hospital course now complicated by intermittent chest pain, and anemia requiring PRBC transfusion. See below for further details  Subjective: Bed, appears to be in no distress but demanding IV morphine for atypical chest pain, denies any shortness of breath, no abdominal pain or focal weakness.   Assessment/Plan:   Acute respiratory failure with hypoxia: Secondary to noncardiogenic pulmonary edema in the setting of nonadherence to hemodialysis.  Back on room air following hemodialysis.   Acute on chronic Chronic systolic heart failure: Volume status improved after dialysis. Continue Coreg and lisinopril. Last EF by TTE on May 2016 around 35-40% Follow.  Junctional rhythm: Resolved, secondary to hyperkalemia.  Uncontrolled hypertension: Improved-secondary to missed hemodialysis. Continue Coreg, amlodipine, will increase the dose of hydralazine, Imdur and lisinopril. Follow and adjust accordingly.  Chest pain: Continues to have intermittent chest pain- both typical and atypical features. Troponins only minimally elevated. Cycle troponins, follow. Cardiology following as well-await further recommendations from cardiology  Hyperkalemia: Secondary to missed hemodialysis, resolved following dialysis. Follow  ESRD: Noncompliant-nephrology following, underwent emergent hemodialysis on 2/17, and will undergo another dialysis on 2/18. Has history of failed renal transplant-continue low-dose maintenance prednisone. Unfortunately, patient is noncompliant to outpatient dialysis-he apparently has a outpatient dialysis  [place] supposed [place] go on Monday, Wednesday and Friday. He claims he does not have transportation to get there.  Paroxysmal atrial fibrillation CHADS VASC 2 score of > 3 : Rate controlled with Coreg, INR subtherapeutic-? Compliance. Coumadin per pharmacy. INR stable.  AOCD: Secondary to ESRD. 1 unit of packed RBC with dialysis on 09/09/2015, post dialysis and transfusion H&H stable.  History of DVT/PE: Continue Coumadin-claims he has not missed any doses of his medications. INR now stable, pharmacy dosing Coumadin. Follow.  History of chronic pain/narcotic abuse: Supportive care for now. Appears to be in no distress but still demanding for IV narcotics every few hours per nursing staff, will place on oral narcotics and discontinue IV narcotics.  Noncompliance: Counseled, he understands seriousness of missing dialysis-he understands life threatening/leg disabling consequences.   Disposition: Remain inpatient-per Cards  Antimicrobial agents  See below  Anti-infectives    None      DVT Prophylaxis: Coumadin  Lab Results  Component Value Date   INR 2.12* 09/10/2015   INR 1.66* 09/09/2015   INR 1.51* 09/08/2015    Code Status: Full code  Family Communication None at bedside  Procedures: None  CONSULTS:  nephrology, Cards  Time spent 30 minutes-Greater than 50% of this time was spent in counseling, explanation of diagnosis, planning of further management, and coordination of care.  MEDICATIONS: Scheduled Meds: . sodium chloride   Intravenous Once  . amLODipine  10 mg Oral Daily  . atorvastatin  40 mg Oral q1800  . carvedilol  25 mg Oral BID WC  . cinacalcet  30 mg Oral Q breakfast  . diphenhydrAMINE  25 mg Intravenous Once  . docusate sodium  100 mg Oral BID  . gabapentin  100 mg Oral TID  . hydrALAZINE  50 mg Oral 3 times per day  . insulin aspart  0-9 Units Subcutaneous TID WC  .  isosorbide mononitrate  60 mg Oral Daily  . lisinopril   20 mg Oral Daily  . multivitamin  1 tablet Oral QHS  . pantoprazole  40 mg Oral Daily  . predniSONE  5 mg Oral Q breakfast  . sevelamer carbonate  1,600 mg Oral TID WC  . sodium chloride flush  3 mL Intravenous Q12H  . Warfarin - Pharmacist Dosing Inpatient   Does not apply q1800   Continuous Infusions:  PRN Meds:.sodium chloride, sodium chloride, acetaminophen **OR** [DISCONTINUED] acetaminophen, ALPRAZolam, hydrALAZINE, HYDROcodone-acetaminophen, levalbuterol, nitroGLYCERIN, ondansetron **OR** ondansetron (ZOFRAN) IV, oxyCODONE-acetaminophen, sevelamer carbonate    PHYSICAL EXAM: Vital signs in last 24 hours: Filed Vitals:   09/10/15 0600 09/10/15 0612 09/10/15 0757 09/10/15 0759  BP:  176/96 157/116 170/106  Pulse:   86   Temp:   98.3 F (36.8 C)   TempSrc:   Oral   Resp:   18   Height:      Weight: 71.6 kg (157 lb 13.6 oz)     SpO2:   100%     Weight change: -2.7 kg (-5 lb 15.2 oz) Filed Weights   09/09/15 0700 09/09/15 1115 09/10/15 0600  Weight: 74.5 kg (164 lb 3.9 oz) 71.5 kg (157 lb 10.1 oz) 71.6 kg (157 lb 13.6 oz)   Body mass index is 20.26 kg/(m^2).   Gen Exam: Awake and alert with clear speech.   Neck: Supple, No JVD.   Chest: B/L Clear.   CVS: S1 S2 Regular, +syst murmur.  Abdomen: soft, BS +, non tender, non distended.  Extremities: no edema, lower extremities warm to touch. Neurologic: Non Focal.   Skin: No Rash.   Wounds: N/A.   Intake/Output from previous day:  Intake/Output Summary (Last 24 hours) at 09/10/15 0912 Last data filed at 09/10/15 0052  Gross per 24 hour  Intake   1775 ml  Output   3015 ml  Net  -1240 ml     LAB RESULTS: CBC  Recent Labs Lab 09/08/15 1012 09/08/15 2131 09/09/15 0357 09/09/15 0753 09/09/15 1348 09/10/15 0329  WBC 4.7 4.5 4.9  --  6.8 6.3  HGB 7.8* 7.6* 7.2* 7.0* 8.6* 8.6*  HCT 23.2* 23.3* 22.3* 21.7* 26.7* 27.1*  PLT 168 170 163  --  144* 163  MCV 85.3 85.7 85.8  --  86.7 86.9  MCH 28.7 27.9 27.7  --   27.9 27.6  MCHC 33.6 32.6 32.3  --  32.2 31.7  RDW 15.0 14.9 14.9  --  14.7 14.8    Chemistries   Recent Labs Lab 09/07/15 2357 09/08/15 1012 09/08/15 2131 09/09/15 0357 09/10/15 0329  NA 139 137 134* 136 137  K 6.5* 3.6 5.0 5.1 4.5  CL 101 98* 99* 99* 101  CO2 _0 GLUCOSE 86 99 147* 89 80  BUN 76* 27* 34* 39* 25*  CREATININE 15.84* 7.37* 9.02* 9.68* 6.68*  CALCIUM 9.1 8.5* 8.3* 8.2* 8.1*    CBG:  Recent Labs Lab 09/08/15 2130 09/09/15 1213 09/09/15 1701 09/09/15 2108 09/10/15 0756  GLUCAP 135* 78 117* 134* 116*    GFR Estimated Creatinine Clearance: 13.2 mL/min (by C-G formula based on Cr of 6.68).  Coagulation profile  Recent Labs Lab 09/08/15 0043 09/08/15 1012 09/09/15 0357 09/10/15 0329  INR 1.53* 1.51* 1.66* 2.12*    Cardiac Enzymes  Recent Labs Lab 09/08/15 1012 09/08/15 1549 09/08/15 2131  TROPONINI 0.06* 0.04* 0.05*    Invalid input(s): POCBNP No results for input(s): DDIMER  in the last 72 hours. No results for input(s): HGBA1C in the last 72 hours. No results for input(s): CHOL, HDL, LDLCALC, TRIG, CHOLHDL, LDLDIRECT in the last 72 hours. No results for input(s): TSH, T4TOTAL, T3FREE, THYROIDAB in the last 72 hours.  Invalid input(s): FREET3 No results for input(s): VITAMINB12, FOLATE, FERRITIN, TIBC, IRON, RETICCTPCT in the last 72 hours. No results for input(s): LIPASE, AMYLASE in the last 72 hours.  Urine Studies No results for input(s): UHGB, CRYS in the last 72 hours.  Invalid input(s): UACOL, UAPR, USPG, UPH, UTP, UGL, UKET, UBIL, UNIT, UROB, ULEU, UEPI, UWBC, URBC, UBAC, CAST, UCOM, BILUA  MICROBIOLOGY: Recent Results (from the past 240 hour(s))  MRSA PCR Screening     Status: None   Collection Time: 09/08/15  3:30 AM  Result Value Ref Range Status   MRSA by PCR NEGATIVE NEGATIVE Final    Comment:        The GeneXpert MRSA Assay (FDA approved for NASAL specimens only), is one component of a comprehensive  MRSA colonization surveillance program. It is not intended to diagnose MRSA infection nor to guide or monitor treatment for MRSA infections.     RADIOLOGY STUDIES/RESULTS: Dg Chest 2 View  09/08/2015  CLINICAL DATA:  Chest pain radiating to the left arm. Shortness of breath. Nausea and vomiting beginning tonight. Cough and congestion. EXAM: CHEST  2 VIEW COMPARISON:  09/01/2015 FINDINGS: Right central venous catheter with tip over the cavoatrial junction/upper RA. Diffuse cardiac enlargement. Mild pulmonary vascular congestion. No edema or consolidation. No blunting of costophrenic angles. No pneumothorax. IMPRESSION: Cardiac enlargement with mild pulmonary vascular congestion suggesting fluid overload. No edema or consolidation. Electronically Signed   By: Lucienne Capers M.D.   On: 09/08/2015 02:12   Dg Chest 2 View  09/02/2015  CLINICAL DATA:  50 year old male with shortness of breath and chest pain EXAM: CHEST  2 VIEW COMPARISON:  Radiograph dated 08/25/2015 FINDINGS: 50 year old catheter is noted with tip over the right atrium in stable positioning. There is stable cardiomegaly with mild vascular congestion. No focal consolidation, pleural effusion, or pneumothorax. No acute osseous pathology. IMPRESSION: No interval change. Electronically Signed   By: Anner Crete M.D.   On: 09/02/2015 00:02   Dg Chest 2 View  08/18/2015  CLINICAL DATA:  Chest pain and shortness of breath while at dialysis today. EXAM: CHEST  2 VIEW COMPARISON:  08/10/2015. FINDINGS: The cardio pericardial silhouette is enlarged. There is pulmonary vascular congestion without overt pulmonary edema. The lungs are clear wiithout focal pneumonia, pneumothorax or pleural effusion. Right IJ dialysis catheter tip projects at the level of the mid right atrium. The visualized bony structures of the thorax are intact. IMPRESSION: Cardiomegaly with vascular congestion. Electronically Signed   By: Misty Stanley M.D.   On: 08/18/2015  15:57   Dg Chest Port 1 View  08/19/2015  CLINICAL DATA:  50 year old male with shortness of breath. End-stage renal disease. EXAM: PORTABLE CHEST 1 VIEW COMPARISON:  08/18/2015 FINDINGS: Cardiomegaly and pulmonary vascular congestion noted. Slightly increased left retrocardiac opacity may represent atelectasis. Trace bilateral pleural effusions are present. There is no evidence of pneumothorax. A right IJ central venous catheter is present with tip overlying the upper right atrium. IMPRESSION: Little significant change except for possible development of left lower lung atelectasis. Cardiomegaly, pulmonary vascular congestion and trace pleural effusions again noted. Electronically Signed   By: Margarette Canada M.D.   On: 08/19/2015 07:07   Dg Abd Acute W/chest  08/25/2015  CLINICAL  DATA:  BILATERAL groin pain greater on RIGHT, recent hernia surgery 4 days ago, hypertension, diabetes mellitus, end- stage renal disease on dialysis, CHF, former smoker EXAM: DG ABDOMEN ACUTE W/ 1V CHEST COMPARISON:  08/19/2015 chest radiograph, abdominal radiograph 02/11/2015 FINDINGS: RIGHT jugular dual-lumen central venous catheter with tip projecting over high RIGHT atrium. Enlargement of cardiac silhouette with pulmonary vascular congestion. Central peribronchial thickening with question minimal perihilar edema asymmetric on RIGHT. Remaining lungs clear. No definite pleural effusion or pneumothorax. Normal bowel gas pattern with scattered stool and gas throughout colon. No bowel dilatation, bowel wall thickening, or free intraperitoneal air. Osseous structures unremarkable. Scattered atherosclerotic calcifications. No definite urinary tract calcification. IMPRESSION: Enlargement of cardiac silhouette with pulmonary vascular congestion and question minimal asymmetric RIGHT perihilar edema. No acute abdominal findings. Electronically Signed   By: Lavonia Dana M.D.   On: 08/25/2015 14:37    [name], MD  [facility] Pager:[phone]  If 7PM-7AM, please contact night-coverage [email or web address] Password [email or web address] 09/10/2015, 9:12 AM   LOS: 2 days

## 2015-09-10 NOTE — Progress Notes (Signed)
ESRD Hyperkalemia with bradycardia Cardiomyopathy Hypertension, uncontrolled  Plan: Dialysis again in AM. Increase lisinopril dosage. Plans per cardiology. Not sure if he still needs steroids(after failed transplant) but will defer to his primary nephrologist   Objective: Vital signs in last 24 hours: Temp:  [98 F (36.7 C)-98.5 F (36.9 C)] 98.3 F (36.8 C) (02/19 0757) Pulse Rate:  [76-86] 86 (02/19 0757) Resp:  [13-20] 18 (02/19 0757) BP: (130-179)/(82-116) 170/106 mmHg (02/19 0759) SpO2:  [97 %-100 %] 100 % (02/19 0757) Weight:  [71.5 kg (157 lb 10.1 oz)-71.6 kg (157 lb 13.6 oz)] 71.6 kg (157 lb 13.6 oz) (02/19 0600) Weight change: -2.7 kg (-5 lb 15.2 oz)  Intake/Output from previous day: 02/18 0701 - 02/19 0700 In: 1775 [P.O.:1440; Blood:335] Out: 3015  Intake/Output this shift:    General appearance: alert and cooperative Extremities: extremities normal, atraumatic, no cyanosis or edema  PC in chest  Lab Results:  Recent Labs  09/09/15 1348 09/10/15 0329  WBC 6.8 6.3  HGB 8.6* 8.6*  HCT 26.7* 27.1*  PLT 144* 163   BMET:  Recent Labs  09/09/15 0357 09/10/15 0329  NA 136 137  K 5.1 4.5  CL 99* 101  CO2 27 27  GLUCOSE 89 80  BUN 39* 25*  CREATININE 9.68* 6.68*  CALCIUM 8.2* 8.1*   No results for input(s): PTH in the last 72 hours. Iron Studies: No results for input(s): IRON, TIBC, TRANSFERRIN, FERRITIN in the last 72 hours. Studies/Results: No results found.  Scheduled: . sodium chloride   Intravenous Once  . amLODipine  10 mg Oral Daily  . atorvastatin  40 mg Oral q1800  . carvedilol  25 mg Oral BID WC  . cinacalcet  30 mg Oral Q breakfast  . diphenhydrAMINE  25 mg Intravenous Once  . docusate sodium  100 mg Oral BID  . gabapentin  100 mg Oral TID  . hydrALAZINE  100 mg Oral 3 times per day  . insulin aspart  0-9 Units Subcutaneous TID WC  . isosorbide mononitrate  60 mg Oral Daily  . lisinopril  20 mg Oral Daily  . multivitamin  1 tablet  Oral QHS  . pantoprazole  40 mg Oral Daily  . predniSONE  5 mg Oral Q breakfast  . sevelamer carbonate  1,600 mg Oral TID WC  . sodium chloride flush  3 mL Intravenous Q12H  . warfarin  5 mg Oral ONCE-1800  . Warfarin - Pharmacist Dosing Inpatient   Does not apply q1800    LOS: 2 days   Krisna Omar C 09/10/2015,10:13 AM

## 2015-09-10 NOTE — Progress Notes (Addendum)
Patient ID: Frank [name], male   DOB: [birth year], 50 y.o.   MRN: [id number]      Subjective:    Still with intermittent sharp chest pain.   Objective:   Temp:  [98 F (36.7 C)-98.5 F (36.9 C)] 98.3 F (36.8 C) (02/19 0757) Pulse Rate:  [76-86] 86 (02/19 0757) Resp:  [13-20] 18 (02/19 0757) BP: (130-179)/(82-116) 170/106 mmHg (02/19 0759) SpO2:  [97 %-100 %] 100 % (02/19 0757) Weight:  [157 lb 10.1 oz (71.5 kg)-157 lb 13.6 oz (71.6 kg)] 157 lb 13.6 oz (71.6 kg) (02/19 0600) Last BM Date: 09/08/15  Filed Weights   09/09/15 0700 09/09/15 1115 09/10/15 0600  Weight: 164 lb 3.9 oz (74.5 kg) 157 lb 10.1 oz (71.5 kg) 157 lb 13.6 oz (71.6 kg)    Intake/Output Summary (Last 24 hours) at 09/10/15 1015 Last data filed at 09/10/15 0052  Gross per 24 hour  Intake   1775 ml  Output   3015 ml  Net  -1240 ml    Telemetry: NSR  Exam:  General: NAD  HEENT: sclera clear, throat clear  Resp: CTAB  Cardiac: RRR, no m/r/g, no JVD GI: abdomen soft, NT, ND  MSK: no LE edema  Neuro: no focal deficits  Psych: appropriate affect  Lab Results:  Basic Metabolic Panel:  Recent Labs Lab 09/08/15 2131 09/09/15 0357 09/10/15 0329  NA 134* 136 137  K 5.0 5.1 4.5  CL 99* 99* 101  CO2 _0 GLUCOSE 147* 89 80  BUN 34* 39* 25*  CREATININE 9.02* 9.68* 6.68*  CALCIUM 8.3* 8.2* 8.1*    Liver Function Tests:  Recent Labs Lab 09/08/15 2131 09/09/15 0357 09/10/15 0329  ALBUMIN 2.6* 2.4* 2.6*    CBC:  Recent Labs Lab 09/09/15 0357 09/09/15 0753 09/09/15 1348 09/10/15 0329  WBC 4.9  --  6.8 6.3  HGB 7.2* 7.0* 8.6* 8.6*  HCT 22.3* 21.7* 26.7* 27.1*  MCV 85.8  --  86.7 86.9  PLT 163  --  144* 163    Cardiac Enzymes:  Recent Labs Lab 09/08/15 1012 09/08/15 1549 09/08/15 2131  TROPONINI 0.06* 0.04* 0.05*    BNP: No results for input(s): PROBNP in the last 8760 hours.  Coagulation:  Recent Labs Lab 09/08/15 1012 09/09/15 0357 09/10/15 0329  INR  1.51* 1.66* 2.12*    ECG:   Medications:   Scheduled Medications: . sodium chloride   Intravenous Once  . amLODipine  10 mg Oral Daily  . atorvastatin  40 mg Oral q1800  . carvedilol  25 mg Oral BID WC  . cinacalcet  30 mg Oral Q breakfast  . diphenhydrAMINE  25 mg Intravenous Once  . docusate sodium  100 mg Oral BID  . gabapentin  100 mg Oral TID  . hydrALAZINE  100 mg Oral 3 times per day  . insulin aspart  0-9 Units Subcutaneous TID WC  . isosorbide mononitrate  60 mg Oral Daily  . lisinopril  20 mg Oral Daily  . multivitamin  1 tablet Oral QHS  . pantoprazole  40 mg Oral Daily  . predniSONE  5 mg Oral Q breakfast  . sevelamer carbonate  1,600 mg Oral TID WC  . sodium chloride flush  3 mL Intravenous Q12H  . warfarin  5 mg Oral ONCE-1800  . Warfarin - Pharmacist Dosing Inpatient   Does not apply q1800     Infusions:     PRN Medications:  sodium chloride, sodium chloride, acetaminophen **OR** [  DISCONTINUED] acetaminophen, ALPRAZolam, hydrALAZINE, HYDROcodone-acetaminophen, levalbuterol, nitroGLYCERIN, ondansetron **OR** ondansetron (ZOFRAN) IV, oxyCODONE-acetaminophen, sevelamer carbonate     Assessment/Plan    1. Chest pain/Chronic systolif HF - atypical symptoms. Fairly mild nonspecific flat troponin in setting of volume overload from missed HD sessions. EKG chronic ST/T changes - echo 08/2015 LVEF 20-25%, grade II diastolic dysfunction - cath 08/2014 minimal CAD - LVEF has decreased from echo 11/2014. I think this is likely secondary to poor compliance, he had a cath 08/2014 with minimal CAD. Cath was done after an abnormal myoview. Without objective evidence of ischemia would not repeat,with prior false positive myoview would not repeat.   - will increase imdur to 33m daily.   2. ESRD - admitted after missing several HD sessions.   3. Bradycardia/juntional rhythm - in setting of hyperkalemia due to missed HD sessions.  - this has resolved  4. PAF - rate  controlled with coreg, anticoag with coumadin  5. History of DVT/PE - on long term coumadin  6. Chronic pain/opiate abuse - management per primary team. From notes he has been demanding IV narcotics frequently.    JCarlyle Dolly M.D.

## 2015-09-10 NOTE — Progress Notes (Signed)
[facility] for Coumadin Indication: atrial fibrillation  Allergies  Allergen Reactions  . Butalbital-Apap-Caffeine Shortness Of Breath and Swelling    Swelling in throat  . Ferrlecit [Na Ferric Gluc Cplx In Sucrose] Shortness Of Breath, Swelling and Other (See Comments)    Swelling in throat  . Minoxidil Shortness Of Breath  . Darvocet [Propoxyphene N-Acetaminophen] Hives  . Propoxyphene Hives    Allergy listed on file from [facility] (Pt did not mention any allergies)     Patient Measurements: Height: _0  (188 cm) Weight: 157 lb 13.6 oz (71.6 kg) IBW/kg (Calculated) : 82.2  Vital Signs: Temp: 98.3 F (36.8 C) (02/19 0757) Temp Source: Oral (02/19 0757) BP: 170/106 mmHg (02/19 0759) Pulse Rate: 86 (02/19 0757)  Labs:  Recent Labs  09/08/15 1012 09/08/15 1549 09/08/15 2131 09/09/15 0357 09/09/15 0753 09/09/15 1348 09/10/15 0329  HGB 7.8*  --  7.6* 7.2* 7.0* 8.6* 8.6*  HCT 23.2*  --  23.3* 22.3* 21.7* 26.7* 27.1*  PLT 168  --  170 163  --  144* 163  LABPROT 18.3*  --   --  19.6*  --   --  23.6*  INR 1.51*  --   --  1.66*  --   --  2.12*  CREATININE 7.37*  --  9.02* 9.68*  --   --  6.68*  TROPONINI 0.06* 0.04* 0.05*  --   --   --   --     Estimated Creatinine Clearance: 13.2 mL/min (by C-G formula based on Cr of 6.68).   Assessment: 50 y.o. male admitted with chest pain, h/o Afib, to continue Coumadin (INR 1.51 at admit with likely non-compliance). -INR up to 2.12 and trend up likely in the setting of HF  PTA Coumadin 10 mg daily  Goal of Therapy:  INR 2-3 Monitor platelets by anticoagulation protocol: Yes   Plan:  Coumadin 5 mg x 1 today INR daily  Hildred Laser, Pharm D 09/10/2015 9:24 AM

## 2015-09-11 LAB — PROTIME-INR
INR: 4.76 — ABNORMAL HIGH (ref 0.00–1.49)
PROTHROMBIN TIME: 43.3 s — AB (ref 11.6–15.2)

## 2015-09-11 LAB — CBC
HCT: 26.7 % — ABNORMAL LOW (ref 39.0–52.0)
HEMOGLOBIN: 8.8 g/dL — AB (ref 13.0–17.0)
MCH: 29.1 pg (ref 26.0–34.0)
MCHC: 33 g/dL (ref 30.0–36.0)
MCV: 88.4 fL (ref 78.0–100.0)
PLATELETS: 151 10*3/uL (ref 150–400)
RBC: 3.02 MIL/uL — AB (ref 4.22–5.81)
RDW: 15 % (ref 11.5–15.5)
WBC: 6.6 10*3/uL (ref 4.0–10.5)

## 2015-09-11 LAB — RENAL FUNCTION PANEL
ANION GAP: 14 (ref 5–15)
Albumin: 2.7 g/dL — ABNORMAL LOW (ref 3.5–5.0)
BUN: 41 mg/dL — ABNORMAL HIGH (ref 6–20)
CHLORIDE: 98 mmol/L — AB (ref 101–111)
CO2: 25 mmol/L (ref 22–32)
Calcium: 8.3 mg/dL — ABNORMAL LOW (ref 8.9–10.3)
Creatinine, Ser: 9.17 mg/dL — ABNORMAL HIGH (ref 0.61–1.24)
GFR, EST AFRICAN AMERICAN: 7 mL/min — AB (ref >60)
GFR, EST NON AFRICAN AMERICAN: 6 mL/min — AB (ref >60)
Glucose, Bld: 91 mg/dL (ref 65–99)
POTASSIUM: 4.9 mmol/L (ref 3.5–5.1)
Phosphorus: 4.2 mg/dL (ref 2.5–4.6)
Sodium: 137 mmol/L (ref 135–145)

## 2015-09-11 LAB — GLUCOSE, CAPILLARY
Glucose-Capillary: 83 mg/dL (ref 65–99)
Glucose-Capillary: 89 mg/dL (ref 65–99)

## 2015-09-11 MED ORDER — HEPARIN SODIUM (PORCINE) 1000 UNIT/ML DIALYSIS: 1000.0000 [IU] | INTRAMUSCULAR | Status: DC | PRN

## 2015-09-11 MED ORDER — ONDANSETRON HCL 4 MG/2ML IJ SOLN
INTRAMUSCULAR | Status: AC
  Filled 2015-09-11: qty 2

## 2015-09-11 MED ORDER — ALTEPLASE 2 MG IJ SOLR: 2.0000 mg | Freq: Once | INTRAMUSCULAR | Status: DC | PRN

## 2015-09-11 MED ORDER — HYDROCODONE-ACETAMINOPHEN 5-325 MG PO TABS: 1.0000 | ORAL_TABLET | Freq: Four times a day (QID) | ORAL | Status: DC | PRN

## 2015-09-11 MED ORDER — ALPRAZOLAM 0.5 MG PO TABS: 0.5000 mg | ORAL_TABLET | Freq: Two times a day (BID) | ORAL | Status: DC | PRN

## 2015-09-11 MED ORDER — HEPARIN SODIUM (PORCINE) 1000 UNIT/ML DIALYSIS: 20.0000 [IU]/kg | INTRAMUSCULAR | Status: DC | PRN

## 2015-09-11 MED ORDER — PENTAFLUOROPROP-TETRAFLUOROETH EX AERO: 1.0000 "application " | INHALATION_SPRAY | CUTANEOUS | Status: DC | PRN

## 2015-09-11 MED ORDER — SODIUM CHLORIDE 0.9 % IV SOLN: 100.0000 mL | INTRAVENOUS | Status: DC | PRN

## 2015-09-11 MED ORDER — OXYCODONE-ACETAMINOPHEN 5-325 MG PO TABS
ORAL_TABLET | ORAL | Status: AC
  Administered 2015-09-11: 1 via ORAL
  Filled 2015-09-11: qty 1

## 2015-09-11 MED ORDER — LIDOCAINE HCL (PF) 1 % IJ SOLN: 5.0000 mL | INTRAMUSCULAR | Status: DC | PRN

## 2015-09-11 MED ORDER — LIDOCAINE-PRILOCAINE 2.5-2.5 % EX CREA: 1.0000 "application " | TOPICAL_CREAM | CUTANEOUS | Status: DC | PRN

## 2015-09-11 MED ORDER — WARFARIN SODIUM 6 MG PO TABS: 10.0000 mg | ORAL_TABLET | Freq: Every day | ORAL | Status: DC

## 2015-09-11 MED ORDER — HYDRALAZINE HCL 50 MG PO TABS: 100.0000 mg | ORAL_TABLET | Freq: Three times a day (TID) | ORAL | Status: DC

## 2015-09-11 MED ORDER — ISOSORBIDE MONONITRATE ER 30 MG PO TB24: 90.0000 mg | ORAL_TABLET | Freq: Every day | ORAL | Status: DC

## 2015-09-11 NOTE — Plan of Care (Signed)
Problem: Health Behavior/Discharge Planning: Goal: Ability to manage health-related needs will improve Outcome: Completed/Met Date Met:  09/11/15 Patient given prescriptions and pharmacy information, Discharged with instructions for follow up and medication compliance.

## 2015-09-11 NOTE — Care Management Important Message (Signed)
Important Message  Patient Details  Name: Frank [name] MRN: [id number] Date of Birth: 17-Oct-1965   Medicare Important Message Given:  Yes    Loann Quill 09/11/2015, 11:41 AM

## 2015-09-11 NOTE — Discharge Summary (Signed)
Frank [name], is a 50 y.o. male  DOB 1966/05/10  MRN [id number].  Admission date:  09/07/2015  Admitting Physician  [name], MD  Discharge Date:  09/11/2015   Primary MD  [name] Patient  Recommendations for primary care physician for things to follow:   Monitor INR closely check in the next 2-3 days. Must follow with [facility] cardiology within 1-2 months.   Admission Diagnosis  Hyperkalemia [E87.5] Chest wall pain [R07.89] Junctional bradycardia [R00.1] Anemia of renal disease [D63.1] End-stage renal disease on hemodialysis (HCC) [N18.6, Z99.2] Subtherapeutic international normalized ratio (INR) [R79.1] Elevated troponin I level [R79.89]   Discharge Diagnosis  Hyperkalemia [E87.5] Chest wall pain [R07.89] Junctional bradycardia [R00.1] Anemia of renal disease [D63.1] End-stage renal disease on hemodialysis (HCC) [N18.6, Z99.2] Subtherapeutic international normalized ratio (INR) [R79.1] Elevated troponin I level [R79.89]     Principal Problem:   Acute respiratory failure with hypoxia (HCC) Active Problems:   ESRD on dialysis- Tues, Thurs, Sat in [facility], nephro: [name]   DM (diabetes mellitus), type 2, uncontrolled, with renal complications ([place])   Hyperkalemia   Chest pain   Hypertensive urgency   Acute respiratory failure [facility])      Past Medical History  Diagnosis Date  . Hypertension   . Depression   . Complication of anesthesia     itching, sore throat  . Diabetes mellitus without complication ([place])     No history per patient, but remains under history as A1c would not be accurate given on dialysis  . Shortness of breath   . Anxiety   . ESRD (end stage renal disease) ([place])     due to HTN per patient, followed at [facility], s/p failed kidney transplant - dialysis Tue, Th,  Sat  . Renal insufficiency   . CHF (congestive heart failure) ([place])   . Anemia   . Dialysis patient [facility])     Past Surgical History  Procedure Laterality Date  . Kidney receipient  2006    failed and started HD in March 2014  . Capd insertion    . Capd removal    . Left heart catheterization with coronary angiogram N/A 09/02/2014    Procedure: LEFT HEART CATHETERIZATION WITH CORONARY ANGIOGRAM;  Surgeon: [name], MD;  Location: [facility] CATH LAB;  Service: Cardiovascular;  Laterality: N/A;  . Inguinal hernia repair Right 02/14/2015    Procedure: REPAIR INCARCERATED RIGHT INGUINAL HERNIA;  Surgeon: [name], MD;  Location: [place];  Service: General;  Laterality: Right;       HPI  from the history and physical done on the day of admission:    50 year old male history of ESRD-noncompliant with outpatient dialysis-presented with acute respiratory failure, hyperkalemia in junctional rhythm. Underwent emergent dialysis, hospital course now complicated by intermittent chest pain, and anemia requiring PRBC transfusion. See below for further details     Hospital Course:     Acute respiratory failure with hypoxia: Secondary to noncardiogenic pulmonary edema in the setting of nonadherence to  hemodialysis. Back on room air following hemodialysis.   Acute on chronic Chronic systolic heart failure: Volume status improved after dialysis. Continue Coreg and lisinopril. Last EF by TTE on May 2016 around 35-40% Follow.  Junctional rhythm: Resolved, secondary to hyperkalemia.  Uncontrolled hypertension: Improved-secondary to missed hemodialysis. Continue Coreg, amlodipine, Frank increase the dose of hydralazine, Imdur and lisinopril. Follow and adjust accordingly.  Chest pain: Continues to have intermittent chest pain- both typical and atypical features. Troponins only minimally elevated likely due to demand ischemia from fluid overload, not in ACS pattern, seen by cardiology, he had a  unremarkable left heart cath recently after false-positive stress test.  Per Cardiology no further workup except increasing Imdur to 90 mg daily. He Frank follow with cardiology in the outpatient setting post discharge. Likely Frank require a repeat echogram in the next 2-3 months.   Hyperkalemia: Secondary to missed hemodialysis, resolved following dialysis. Follow  ESRD: Noncompliant-nephrology following, underwent emergent hemodialysis on 2/17, and Frank undergo another dialysis on 2/18. Has history of failed renal transplant-continue low-dose maintenance prednisone. Unfortunately, patient is noncompliant to outpatient dialysis-he apparently has a outpatient dialysis center in [place] he supposed to go on Monday, Wednesday and Friday. He claims he does not have transportation to get there. He was seen this admission by social work and case much up. Case discussed during the morning meeting on 09/11/2015.  Paroxysmal atrial fibrillation CHADS VASC 2 score of > 3 : Rate controlled with Coreg, INR is around 4 this morning, asked to skip today's Coumadin, request PCP to recheck in our next visit in 2-3 days.  AOCD: Secondary to ESRD. 1 unit of packed RBC with dialysis on 09/09/2015, post dialysis and transfusion H&H stable.  History of DVT/PE: Continue Coumadin-claims he has not missed any doses of his medications. INR now stable, pharmacy dosing Coumadin. Follow.  History of chronic pain/narcotic abuse: Supportive care for now. Appears to be in no distress but still demanding for IV narcotics every few hours per nursing staff, Frank place on oral narcotics and discontinue IV narcotics.  Noncompliance: Counseled, he understands seriousness of missing dialysis-he understands life threatening/leg disabling consequences.       Discharge Condition: Fair  Follow UP  Follow-up Information    Follow up with PCP. Schedule an appointment as soon as possible for a visit in 2 days.   Why:   INR check      Follow up with Nahser, [name], MD. Schedule an appointment as soon as possible for a visit in 1 month.   Specialty:  Cardiology   Contact information:   [address] [place] [address] [phone]        Consults obtained - Cards, Renal  Diet and Activity recommendation: See Discharge Instructions below  Discharge Instructions           Discharge Instructions    Discharge instructions    Complete by:  As directed   Follow with Primary MD in 2 days   Get CBC, CMP, INR,  2 view Chest X ray checked  by Primary MD next visit.    Activity: As tolerated with Full fall precautions use walker/cane & assistance as needed   Disposition Home     Diet:   Renal - Check your Weight same time everyday, if you gain over 2 pounds, or you develop in leg swelling, experience more shortness of breath or chest pain, call your Primary MD immediately. Follow Cardiac Low Salt Diet and 1.2 lit/day  fluid restriction.   On your next visit with your primary care physician please Get Medicines reviewed and adjusted.   Please request your Prim.MD to go over all Hospital Tests and Procedure/Radiological results at the follow up, please get all Hospital records sent to your Prim MD by signing hospital release before you go home.   If you experience worsening of your admission symptoms, develop shortness of breath, life threatening emergency, suicidal or homicidal thoughts you must seek medical attention immediately by calling 911 or calling your MD immediately  if symptoms less severe.  You Must read complete instructions/literature along with all the possible adverse reactions/side effects for all the Medicines you take and that have been prescribed to you. Take any new Medicines after you have completely understood and accpet all the possible adverse reactions/side effects.   Do not drive, operating heavy machinery, perform activities at heights, swimming or  participation in water activities or provide baby sitting services if your were admitted for syncope or siezures until you have seen by Primary MD or a Neurologist and advised to do so again.  Do not drive when taking Pain medications.    Do not take more than prescribed Pain, Sleep and Anxiety Medications  Special Instructions: If you have smoked or chewed Tobacco  in the last 2 yrs please stop smoking, stop any regular Alcohol  and or any Recreational drug use.  Wear Seat belts while driving.   Please note  You were cared for by a hospitalist during your hospital stay. If you have any questions about your discharge medications or the care you received while you were in the hospital after you are discharged, you can call the unit and asked to speak with the hospitalist on call if the hospitalist that took care of you is not available. Once you are discharged, your primary care physician Frank handle any further medical issues. Please note that NO REFILLS for any discharge medications Frank be authorized once you are discharged, as it is imperative that you return to your primary care physician (or establish a relationship with a primary care physician if you do not have one) for your aftercare needs so that they can reassess your need for medications and monitor your lab values.     Increase activity slowly    Complete by:  As directed              Discharge Medications       Medication List    TAKE these medications        ALPRAZolam 0.5 MG tablet  Commonly known as:  XANAX  Take 1 tablet (0.5 mg total) by mouth 2 (two) times daily as needed for anxiety.     amLODipine 10 MG tablet  Commonly known as:  NORVASC  Take 10 mg by mouth daily.     atorvastatin 40 MG tablet  Commonly known as:  LIPITOR  Take 1 tablet (40 mg total) by mouth daily at 6 PM.     carvedilol 25 MG tablet  Commonly known as:  COREG  Take 1 tablet (25 mg total) by mouth 2 (two) times daily with a meal.       cinacalcet 30 MG tablet  Commonly known as:  SENSIPAR  Take 30 mg by mouth daily.     docusate sodium 100 MG capsule  Commonly known as:  COLACE  Take 1 capsule (100 mg total) by mouth 2 (two) times daily.     doxercalciferol 4 MCG/2ML  injection  Commonly known as:  HECTOROL  Inject 1.25 mLs (2.5 mcg total) into the vein Every Tuesday,Thursday,and Saturday with dialysis.     gabapentin 100 MG capsule  Commonly known as:  NEURONTIN  Take 100 mg by mouth 3 (three) times daily.     hydrALAZINE 50 MG tablet  Commonly known as:  APRESOLINE  Take 2 tablets (100 mg total) by mouth every 8 (eight) hours.     hydrocerin Crea  Apply 1 application topically 2 (two) times daily.     HYDROcodone-acetaminophen 5-325 MG tablet  Commonly known as:  NORCO/VICODIN  Take 1 tablet by mouth every 6 (six) hours as needed.     isosorbide mononitrate 30 MG 24 hr tablet  Commonly known as:  IMDUR  Take 3 tablets (90 mg total) by mouth daily.     levalbuterol 45 MCG/ACT inhaler  Commonly known as:  XOPENEX HFA  Inhale 2 puffs into the lungs every 6 (six) hours as needed for wheezing or shortness of breath.     lisinopril 20 MG tablet  Commonly known as:  PRINIVIL,ZESTRIL  Take 1 tablet (20 mg total) by mouth daily.     multivitamin Tabs tablet  Take 1 tablet by mouth at bedtime.     omeprazole 20 MG capsule  Commonly known as:  PRILOSEC  Take 20 mg by mouth daily.     predniSONE 5 MG tablet  Commonly known as:  DELTASONE  Take 5 mg by mouth daily with breakfast.     sevelamer carbonate 800 MG tablet  Commonly known as:  RENVELA  Take 800-1,600 mg by mouth 3 (three) times daily with meals. 2 tabs three times daily with meals, and 1 tablet with snacks     warfarin 6 MG tablet  Commonly known as:  COUMADIN  Take 1.5 tablets (9 mg total) by mouth daily at 6 PM. Take daily until adjusted per MD  Start taking on:  09/12/2015        Major procedures and Radiology Reports - PLEASE  review detailed and final reports for all details, in brief -       Dg Chest 2 View  09/08/2015  CLINICAL DATA:  Chest pain radiating to the left arm. Shortness of breath. Nausea and vomiting beginning tonight. Cough and congestion. EXAM: CHEST  2 VIEW COMPARISON:  09/01/2015 FINDINGS: Right central venous catheter with tip over the cavoatrial junction/upper RA. Diffuse cardiac enlargement. Mild pulmonary vascular congestion. No edema or consolidation. No blunting of costophrenic angles. No pneumothorax. IMPRESSION: Cardiac enlargement with mild pulmonary vascular congestion suggesting fluid overload. No edema or consolidation. Electronically Signed   By: Lucienne Capers M.D.   On: 09/08/2015 02:12   Dg Chest 2 View  09/02/2015  CLINICAL DATA:  50 year old male with shortness of breath and chest pain EXAM: CHEST  2 VIEW COMPARISON:  Radiograph dated 08/25/2015 FINDINGS: 50 year old catheter is noted with tip over the right atrium in stable positioning. There is stable cardiomegaly with mild vascular congestion. No focal consolidation, pleural effusion, or pneumothorax. No acute osseous pathology. IMPRESSION: No interval change. Electronically Signed   By: Anner Crete M.D.   On: 09/02/2015 00:02   Dg Chest 2 View  08/18/2015  CLINICAL DATA:  Chest pain and shortness of breath while at dialysis today. EXAM: CHEST  2 VIEW COMPARISON:  08/10/2015. FINDINGS: The cardio pericardial silhouette is enlarged. There is pulmonary vascular congestion without overt pulmonary edema. The lungs are clear wiithout focal pneumonia, pneumothorax or  pleural effusion. Right IJ dialysis catheter tip projects at the level of the mid right atrium. The visualized bony structures of the thorax are intact. IMPRESSION: Cardiomegaly with vascular congestion. Electronically Signed   By: Misty Stanley M.D.   On: 08/18/2015 15:57   Dg Chest Port 1 View  08/19/2015  CLINICAL DATA:  50 year old male with shortness of breath.  End-stage renal disease. EXAM: PORTABLE CHEST 1 VIEW COMPARISON:  08/18/2015 FINDINGS: Cardiomegaly and pulmonary vascular congestion noted. Slightly increased left retrocardiac opacity may represent atelectasis. Trace bilateral pleural effusions are present. There is no evidence of pneumothorax. A right IJ central venous catheter is present with tip overlying the upper right atrium. IMPRESSION: Little significant change except for possible development of left lower lung atelectasis. Cardiomegaly, pulmonary vascular congestion and trace pleural effusions again noted. Electronically Signed   By: Margarette Canada M.D.   On: 08/19/2015 07:07   Dg Abd Acute W/chest  08/25/2015  CLINICAL DATA:  BILATERAL groin pain greater on RIGHT, recent hernia surgery 4 days ago, hypertension, diabetes mellitus, end- stage renal disease on dialysis, CHF, former smoker EXAM: DG ABDOMEN ACUTE W/ 1V CHEST COMPARISON:  08/19/2015 chest radiograph, abdominal radiograph 02/11/2015 FINDINGS: RIGHT jugular dual-lumen central venous catheter with tip projecting over high RIGHT atrium. Enlargement of cardiac silhouette with pulmonary vascular congestion. Central peribronchial thickening with question minimal perihilar edema asymmetric on RIGHT. Remaining lungs clear. No definite pleural effusion or pneumothorax. Normal bowel gas pattern with scattered stool and gas throughout colon. No bowel dilatation, bowel wall thickening, or free intraperitoneal air. Osseous structures unremarkable. Scattered atherosclerotic calcifications. No definite urinary tract calcification. IMPRESSION: Enlargement of cardiac silhouette with pulmonary vascular congestion and question minimal asymmetric RIGHT perihilar edema. No acute abdominal findings. Electronically Signed   By: Lavonia Dana M.D.   On: 08/25/2015 14:37    Micro Results      Recent Results (from the past 240 hour(s))  MRSA PCR Screening     Status: None   Collection Time: 09/08/15  3:30 AM    Result Value Ref Range Status   MRSA by PCR NEGATIVE NEGATIVE Final    Comment:        The GeneXpert MRSA Assay (FDA approved for NASAL specimens only), is one component of a comprehensive MRSA colonization surveillance program. It is not intended to diagnose MRSA infection nor to guide or monitor treatment for MRSA infections.        Today   Subjective    Frank [name] today has no headache,no chest abdominal pain,no new weakness tingling or numbness, feels much better wants to go home today.     Objective   Blood pressure 155/105, pulse 83, temperature 98.2 F (36.8 C), temperature source Oral, resp. rate 14, height _0  (1.88 m), weight 75.2 kg (165 lb 12.6 oz), SpO2 100 %.   Intake/Output Summary (Last 24 hours) at 09/11/15 1007 Last data filed at 09/11/15 0500  Gross per 24 hour  Intake   1560 ml  Output      0 ml  Net   1560 ml    Exam Awake Alert, Oriented x 3, No new F.N deficits, Normal affect [place].AT,PERRAL Supple Neck,No JVD, No cervical lymphadenopathy appriciated.  Symmetrical Chest wall movement, Good air movement bilaterally, CTAB RRR,No Gallops,Rubs or new Murmurs, No Parasternal Heave +ve B.Sounds, Abd Soft, Non tender, No organomegaly appriciated, No rebound -guarding or rigidity. No Cyanosis, Clubbing or edema, No new Rash or bruise   Data Review   CBC  w Diff:  Lab Results  Component Value Date   WBC 6.6 09/11/2015   HGB 8.8* 09/11/2015   HCT 26.7* 09/11/2015   PLT 151 09/11/2015   LYMPHOPCT 17 08/25/2015   MONOPCT 6 08/25/2015   EOSPCT 6 08/25/2015   BASOPCT 0 08/25/2015    CMP:  Lab Results  Component Value Date   NA 137 09/11/2015   K 4.9 09/11/2015   CL 98* 09/11/2015   CO2 25 09/11/2015   BUN 41* 09/11/2015   CREATININE 9.17* 09/11/2015   PROT 7.8 08/25/2015   ALBUMIN 2.7* 09/11/2015   BILITOT 0.4 08/25/2015   ALKPHOS 122 08/25/2015   AST 54* 08/25/2015   ALT 16* 08/25/2015  . Lab Results  Component Value Date    INR 4.76* 09/11/2015   INR 2.12* 09/10/2015   INR 1.66* 09/09/2015     Total Time in preparing paper work, data evaluation and todays exam - 35 minutes  [name] M.D on 09/11/2015 at 10:07 AM  [facility]   Office  [phone]

## 2015-09-11 NOTE — Care Management (Addendum)
1352 09-11-15 CM did speak with pt in regards to disposition needs. Per pt he was having problems with transportation. His car is in the shop and will not be out until Thursday. CM did speak with CSW to assist with bus passes and additional transportation needs. Patient is a readmission and he will not qualify for home services due to not being homebound. No further needs from CM at this time.  Bethena Roys, RN,BSN [phone]

## 2015-09-11 NOTE — Procedures (Signed)
Patient was seen on dialysis and the procedure was supervised.  BFR 400  Via PC BP is  Still high.   Patient appears to be tolerating treatment well- plan is for him to be discharged today- he knows to go to HD tomorrow at his home unit and he says that he has BP meds- got to end of treatment today without cramping so maybe could use EDW lowering   Latarra Eagleton A 09/11/2015

## 2015-09-16 ENCOUNTER — Emergency Department (HOSPITAL_COMMUNITY): Payer: Medicare Other

## 2015-09-16 ENCOUNTER — Non-Acute Institutional Stay (HOSPITAL_COMMUNITY)
Admission: EM | Admit: 2015-09-16 | Discharge: 2015-09-16 | Disposition: A | Discharge status: Home / Self Care | Payer: Medicare Other | Attending: Emergency Medicine | Admitting: Emergency Medicine

## 2015-09-16 ENCOUNTER — Encounter (HOSPITAL_COMMUNITY): Payer: Self-pay | Admitting: *Deleted

## 2015-09-16 DIAGNOSIS — Z992 Dependence on renal dialysis: Secondary | ICD-10-CM | POA: Insufficient documentation

## 2015-09-16 DIAGNOSIS — R079 Chest pain, unspecified: Secondary | ICD-10-CM | POA: Diagnosis not present

## 2015-09-16 DIAGNOSIS — I120 Hypertensive chronic kidney disease with stage 5 chronic kidney disease or end stage renal disease: Secondary | ICD-10-CM | POA: Insufficient documentation

## 2015-09-16 DIAGNOSIS — R0789 Other chest pain: Secondary | ICD-10-CM

## 2015-09-16 DIAGNOSIS — E1122 Type 2 diabetes mellitus with diabetic chronic kidney disease: Secondary | ICD-10-CM | POA: Diagnosis not present

## 2015-09-16 DIAGNOSIS — Z7901 Long term (current) use of anticoagulants: Secondary | ICD-10-CM | POA: Insufficient documentation

## 2015-09-16 DIAGNOSIS — I509 Heart failure, unspecified: Secondary | ICD-10-CM | POA: Diagnosis not present

## 2015-09-16 DIAGNOSIS — Z9119 Patient's noncompliance with other medical treatment and regimen: Secondary | ICD-10-CM | POA: Insufficient documentation

## 2015-09-16 DIAGNOSIS — E875 Hyperkalemia: Secondary | ICD-10-CM | POA: Diagnosis not present

## 2015-09-16 DIAGNOSIS — N186 End stage renal disease: Secondary | ICD-10-CM | POA: Insufficient documentation

## 2015-09-16 DIAGNOSIS — Z87891 Personal history of nicotine dependence: Secondary | ICD-10-CM | POA: Insufficient documentation

## 2015-09-16 DIAGNOSIS — Z940 Kidney transplant status: Secondary | ICD-10-CM | POA: Insufficient documentation

## 2015-09-16 LAB — BASIC METABOLIC PANEL
Anion gap: 17 — ABNORMAL HIGH (ref 5–15)
BUN: 94 mg/dL — ABNORMAL HIGH (ref 6–20)
CALCIUM: 8.8 mg/dL — AB (ref 8.9–10.3)
CO2: 19 mmol/L — ABNORMAL LOW (ref 22–32)
CREATININE: 15.22 mg/dL — AB (ref 0.61–1.24)
Chloride: 99 mmol/L — ABNORMAL LOW (ref 101–111)
GFR calc non Af Amer: 3 mL/min — ABNORMAL LOW (ref >60)
GFR, EST AFRICAN AMERICAN: 4 mL/min — AB (ref >60)
Glucose, Bld: 92 mg/dL (ref 65–99)
Potassium: 7.5 mmol/L (ref 3.5–5.1)
SODIUM: 135 mmol/L (ref 135–145)

## 2015-09-16 LAB — CBC
HCT: 25.5 % — ABNORMAL LOW (ref 39.0–52.0)
Hemoglobin: 8.1 g/dL — ABNORMAL LOW (ref 13.0–17.0)
MCH: 27.3 pg (ref 26.0–34.0)
MCHC: 31.8 g/dL (ref 30.0–36.0)
MCV: 85.9 fL (ref 78.0–100.0)
PLATELETS: 164 10*3/uL (ref 150–400)
RBC: 2.97 MIL/uL — AB (ref 4.22–5.81)
RDW: 14.8 % (ref 11.5–15.5)
WBC: 6.4 10*3/uL (ref 4.0–10.5)

## 2015-09-16 LAB — I-STAT TROPONIN, ED: TROPONIN I, POC: 0.09 ng/mL — AB (ref 0.00–0.08)

## 2015-09-16 LAB — PROTIME-INR
INR: 2.49 — ABNORMAL HIGH (ref 0.00–1.49)
PROTHROMBIN TIME: 26.6 s — AB (ref 11.6–15.2)

## 2015-09-16 MED ORDER — FENTANYL CITRATE (PF) 100 MCG/2ML IJ SOLN
INTRAMUSCULAR | Status: DC
Start: 2015-09-16 — End: 2015-09-16
  Filled 2015-09-16: qty 2

## 2015-09-16 MED ORDER — FENTANYL CITRATE (PF) 100 MCG/2ML IJ SOLN
50.0000 ug | Freq: Once | INTRAMUSCULAR | Status: AC
  Administered 2015-09-16: 50 ug via INTRAVENOUS

## 2015-09-16 MED ORDER — CLONIDINE HCL 0.1 MG PO TABS
0.1000 mg | ORAL_TABLET | Freq: Once | ORAL | Status: AC
  Administered 2015-09-16: 0.1 mg via ORAL
  Filled 2015-09-16: qty 1

## 2015-09-16 MED ORDER — INSULIN ASPART 100 UNIT/ML ~~LOC~~ SOLN
10.0000 [IU] | Freq: Once | SUBCUTANEOUS | Status: AC
  Administered 2015-09-16: 10 [IU] via INTRAVENOUS
  Filled 2015-09-16: qty 1

## 2015-09-16 MED ORDER — DEXTROSE 50 % IV SOLN
1.0000 | Freq: Once | INTRAVENOUS | Status: AC
  Administered 2015-09-16: 50 mL via INTRAVENOUS
  Filled 2015-09-16: qty 50

## 2015-09-16 MED ORDER — SODIUM CHLORIDE 0.9 % IV SOLN
1.0000 g | Freq: Once | INTRAVENOUS | Status: AC
  Administered 2015-09-16: 1 g via INTRAVENOUS
  Filled 2015-09-16: qty 10

## 2015-09-16 MED ORDER — ONDANSETRON HCL 4 MG/2ML IJ SOLN
4.0000 mg | Freq: Once | INTRAMUSCULAR | Status: AC
  Administered 2015-09-16: 4 mg via INTRAVENOUS
  Filled 2015-09-16: qty 2

## 2015-09-16 NOTE — ED Notes (Signed)
Brought pt back from triage to room asked pt to get undressed x3 pt refused RN aware

## 2015-09-16 NOTE — ED Notes (Signed)
pt c/o chest pain since this morning, nausea and vomiting.

## 2015-09-16 NOTE — ED Provider Notes (Signed)
CSN: [id number]     Arrival date & time 09/16/15  0231 History  By signing my name below, I, Hansel Feinstein, attest that this documentation has been prepared under the direction and in the presence of Merryl Hacker, MD. Electronically Signed: Hansel Feinstein, ED Scribe. 09/16/2015. 3:54 AM.    Chief Complaint  Patient presents with  . Chest Pain   The history is provided by the patient. No language interpreter was used.   HPI Comments: CHIBUEZE BEASLEY is a 50 y.o. male with h/o HTN, DM, ESRD on dialysis, CHF who presents to the Emergency Department complaining of intermittent bilateral leg paresthesias onset this morning. Pt also complains of CP for 1.5 days, nausea, emesis, cough, generalized myalgias. He states his chest pain is worsened with breathing and coughing.  Pt reports h/o similar CP. Pt endorses that he did not take his regular medications today d/t nausea and emesis. Pt notes he is able to ambulate without difficulty. Pt has had multiple visits to the ED for CP in the past and was most recently seen on 09/08/15. Pt states he was last dialyzed 4 days ago. He denies numbness, back pain, leg swelling, diarrhea, fever, SOB.    Past Medical History  Diagnosis Date  . Hypertension   . Depression   . Complication of anesthesia     itching, sore throat  . Diabetes mellitus without complication ([place])     No history per patient, but remains under history as A1c would not be accurate given on dialysis  . Shortness of breath   . Anxiety   . ESRD (end stage renal disease) ([place])     due to HTN per patient, followed at [facility], s/p failed kidney transplant - dialysis Tue, Th, Sat  . Renal insufficiency   . CHF (congestive heart failure) ([place])   . Anemia   . Dialysis patient [facility])    Past Surgical History  Procedure Laterality Date  . Kidney receipient  2006    failed and started HD in March 2014  . Capd insertion    . Capd removal    . Left heart catheterization with coronary angiogram N/A  09/02/2014    Procedure: LEFT HEART CATHETERIZATION WITH CORONARY ANGIOGRAM;  Surgeon: [name], MD;  Location: [facility]: [facility] CATH [facility];  Service: Cardiovascular;  Laterality: N/A;  . Inguinal hernia repair Right 02/14/2015    Procedure: REPAIR INCARCERATED RIGHT INGUINAL HERNIA;  Surgeon: [name], MD;  Location: [place];  Service: General;  Laterality: Right;   Family History  Problem Relation Age of Onset  . Hypertension Other    Social History  Substance Use Topics  . Smoking status: Former Smoker -- 0.00 packs/day for 1 years    Types: Cigarettes  . Smokeless tobacco: Never Used     Comment: quit Jan 2014  . Alcohol Use: No    Review of Systems  Constitutional: Negative for fever.  Respiratory: Positive for cough. Negative for shortness of breath.   Cardiovascular: Positive for chest pain. Negative for leg swelling.  Gastrointestinal: Positive for nausea and vomiting. Negative for diarrhea.  Musculoskeletal: Positive for myalgias. Negative for back pain.  Neurological: Negative for numbness.  All other systems reviewed and are negative.  Allergies  Butalbital-apap-caffeine; Ferrlecit; Minoxidil; Darvocet; and Propoxyphene  Home Medications   Prior to Admission medications   Medication Sig Start Date End Date Taking? Authorizing Provider  ALPRAZolam [name]) 0.5 MG tablet Take 1 tablet (0.5 mg total) by mouth 2 (two) times  daily as needed for anxiety. 09/11/15  Yes [name], MD  amLODipine (NORVASC) 10 MG tablet Take 10 mg by mouth daily.   Yes Historical Provider, MD  atorvastatin (LIPITOR) 40 MG tablet Take 1 tablet (40 mg total) by mouth daily at 6 PM. 09/02/14  Yes Barton Dubois, MD  carvedilol (COREG) 25 MG tablet Take 1 tablet (25 mg total) by mouth 2 (two) times daily with a meal. 10/10/14  Yes [name], DO  cinacalcet (SENSIPAR) 30 MG tablet Take 30 mg by mouth daily.   Yes Historical Provider, MD  docusate sodium (COLACE) 100 MG capsule Take 1 capsule (100 mg  total) by mouth 2 (two) times daily. 11/19/14  Yes [name], MD  doxercalciferol (HECTOROL) 4 MCG/2ML injection Inject 1.25 mLs (2.5 mcg total) into the vein Every Tuesday,Thursday,and Saturday with dialysis. 10/10/14  Yes [name], DO  hydrALAZINE (APRESOLINE) 50 MG tablet Take 2 tablets (100 mg total) by mouth every 8 (eight) hours. 09/11/15  Yes [name], MD  hydrocerin (EUCERIN) CREA Apply 1 application topically 2 (two) times daily. 02/04/15  Yes [name], MD  HYDROcodone-acetaminophen (NORCO/VICODIN) 5-325 MG tablet Take 1 tablet by mouth every 6 (six) hours as needed. 09/11/15  Yes [name], MD  isosorbide mononitrate (IMDUR) 30 MG 24 hr tablet Take 3 tablets (90 mg total) by mouth daily. 09/11/15  Yes [name], MD  levalbuterol [facility] HFA) 45 MCG/ACT inhaler Inhale 2 puffs into the lungs every 6 (six) hours as needed for wheezing or shortness of breath. 11/19/14  Yes [name], MD  lisinopril (PRINIVIL,ZESTRIL) 20 MG tablet Take 1 tablet (20 mg total) by mouth daily. 02/04/15  Yes [name], MD  multivitamin (RENA-VIT) TABS tablet Take 1 tablet by mouth at bedtime. 02/04/15  Yes [name], MD  omeprazole (PRILOSEC) 20 MG capsule Take 20 mg by mouth daily. 07/01/13  Yes Historical Provider, MD  predniSONE (DELTASONE) 5 MG tablet Take 5 mg by mouth daily with breakfast.   Yes Historical Provider, MD  sevelamer carbonate (RENVELA) 800 MG tablet Take 800-1,600 mg by mouth 3 (three) times daily with meals. 2 tabs three times daily with meals, and 1 tablet with snacks   Yes Historical Provider, MD  warfarin (COUMADIN) 6 MG tablet Take 1.5 tablets (9 mg total) by mouth daily at 6 PM. Take daily until adjusted per MD 09/12/15  Yes [name], MD  gabapentin (NEURONTIN) 100 MG capsule Take 100 mg by mouth 3 (three) times daily. 06/14/15 09/12/15  Historical Provider, MD   BP 158/113 mmHg  Pulse 67  Temp(Src) 98.1 F (36.7 C) (Oral)  Resp 19  SpO2  99% Physical Exam  Constitutional: He is oriented to person, place, and time. No distress.  Chronically ill-appearing, no acute distress  HENT:  Head: Normocephalic and atraumatic.  Cardiovascular: Normal rate, regular rhythm and normal heart sounds.   No murmur heard. Pulmonary/Chest: Effort normal and breath sounds normal. No respiratory distress. He has no wheezes.  Vas-Cath palpable right upper chest, crackles with scant wheeze  Abdominal: Soft. Bowel sounds are normal. There is no tenderness. There is no rebound.  Musculoskeletal: He exhibits edema.  Neurological: He is alert and oriented to person, place, and time.  Skin: Skin is warm and dry.  Psychiatric: He has a normal mood and affect.  Nursing note and vitals reviewed.   ED Course  Procedures (including critical care time)  CRITICAL  CARE Performed by: Merryl Hacker   Total critical care time: 35 minutes  Critical care time was exclusive of separately billable procedures and treating other patients.  Critical care was necessary to treat or prevent imminent or life-threatening deterioration.  Critical care was time spent personally by me on the following activities: development of treatment plan with patient and/or surrogate as well as nursing, discussions with consultants, evaluation of patient's response to treatment, examination of patient, obtaining history from patient or surrogate, ordering and performing treatments and interventions, ordering and review of laboratory studies, ordering and review of radiographic studies, pulse oximetry and re-evaluation of patient's condition.  DIAGNOSTIC STUDIES: Oxygen Saturation is 97% on RA, normal by my interpretation.    COORDINATION OF CARE: 3:46 AM Discussed treatment plan with pt at bedside and pt agreed to plan.   Labs Review Labs Reviewed  BASIC METABOLIC PANEL - Abnormal; Notable for the following:    Potassium 7.5 (*)    Chloride 99 (*)    CO2 19 (*)     BUN 94 (*)    Creatinine, Ser 15.22 (*)    Calcium 8.8 (*)    GFR calc non Af Amer 3 (*)    GFR calc Af Amer 4 (*)    Anion gap 17 (*)    All other components within normal limits  CBC - Abnormal; Notable for the following:    RBC 2.97 (*)    Hemoglobin 8.1 (*)    HCT 25.5 (*)    All other components within normal limits  PROTIME-INR - Abnormal; Notable for the following:    Prothrombin Time 26.6 (*)    INR 2.49 (*)    All other components within normal limits  I-STAT TROPOININ, ED - Abnormal; Notable for the following:    Troponin i, poc 0.09 (*)    All other components within normal limits    Imaging Review Dg Chest 2 View  09/16/2015  CLINICAL DATA:  Acute onset of generalized chest pain, nausea and vomiting. Initial encounter. EXAM: CHEST  2 VIEW COMPARISON:  Chest radiograph performed 09/08/2015 FINDINGS: The lungs are well-aerated. Vascular congestion is noted. Mild right-sided and left basilar airspace opacities could reflect mild interstitial edema or pneumonia. There is no evidence of pleural effusion or pneumothorax. The heart is enlarged. A right-sided dual-lumen catheter is noted ending at the right atrium. No acute osseous abnormalities are seen. IMPRESSION: Vascular congestion and cardiomegaly. Mild right-sided and left basilar airspace opacities could reflect mild interstitial edema or pneumonia. Electronically Signed   By: Garald Balding M.D.   On: 09/16/2015 03:02   I have personally reviewed and evaluated these images and lab results as part of my medical decision-making.   EKG Interpretation   Date/Time:  Saturday September 16 2015 02:37:20 EST Ventricular Rate:  62 PR Interval:  216 QRS Duration: 102 QT Interval:  486 QTC Calculation: 493 R Axis:   -55 Text Interpretation:  Sinus rhythm with 1st degree A-V block Possible Left  atrial enlargement Left anterior fascicular block Nonspecific T wave  abnormality Prolonged QT Abnormal ECG When compared with ECG of  09/08/2015,  T wave inversion Anterolateral leads is no longer Present Confirmed by  [facility]  MD, DAVID ([id number]) on 09/16/2015 2:49:12 AM      MDM   Final diagnoses:  Hyperkalemia  Other chest pain    Patient presents with multiple complaints including chest pain, nausea, vomiting. Vital signs notable for blood pressure 179/114. Last dialyzed on Tuesday. Recent  admission with chest pain and hyperkalemia after missing dialysis. Patient states that he did not have a ride. He is in no acute distress. EKG shows no widening of the QRS. Chest x-ray does show some mild interstitial edema. Aside from cough, he has no leukocytosis and is afebrile. Doubt pneumonia. Potassium is 7.5. Patient was given calcium gluconate, insulin, and glucose. Cardiology consulted on the patient while in the hospital during his last admission. He has a baseline elevated troponin. 0.09 is his troponin today which is consistent with prior troponins. Heart cath 1 year ago without significant disease.  Discussed the patient with Dr. Louie Boston. She will arrange for the patient to be dialyzed. If patient tolerates dialysis and repeat lab work looks  reassuring he can be discharged from dialysis. Feel patient's symptoms are likely related to his volume overload.  I personally performed the services described in this documentation, which was scribed in my presence. The recorded information has been reviewed and is accurate.   Merryl Hacker, MD 09/16/15 [phone]

## 2015-09-16 NOTE — Procedures (Signed)
Patient was seen on dialysis and the procedure was supervised.  BFR 400  Via PC BP is  179/136.   Patient appears to be tolerating treatment well- ESRD pt who has an OP spot in [facility]- he was admitted here last weekend- left on Monday with instructions to resume his OP treatments- he tells me he went on Tuesday  But not Thursday- now with K of greater than 7.5- told him how close he was to death and that he would benefit his life greatly if he would just show up for his OP HD- plan is for him to be discharged home after HD today   Kiesha Ensey A 09/16/2015

## 2015-09-16 NOTE — Progress Notes (Addendum)
CM noted pt to have at least monthly visits to this ED, the most recent being 09/07/2015  for Hyperkalemia, Junctional Bradycardia, Chest Wall Pain, End Stage Renal Disease on Hemodialysis; Anemia of Renal Disease, Sub-therapeutic INR and Elevated Troponin Level.  CM also noted pt does not have PCP listed on face sheet. Will interview pt when he returns to ED room and ascertain status of PCP. Repeated notes by Internists, as well as, Nephrologists that pt is non compliant with treatments and "goes from hospital to hospital" telling un-truths about his care. Unsure what I could add to his care at this time. Will continue to follow.

## 2015-09-16 NOTE — ED Notes (Signed)
Per pt, last dialysis was Tuesday.

## 2015-09-16 NOTE — ED Notes (Signed)
HR consistently in the low 40s, [place], MD notified.

## 2015-09-16 NOTE — Progress Notes (Signed)
Patient completed his prescribed tx. 5 liters have been removed. Patient with elevated blood pressure. Clonidine 0.16m given. Patient has refused to stay before blood pressure improvement. Patient understands the complications from hypertension, but declines to stay in the ED any longer.

## 2015-09-18 ENCOUNTER — Encounter (HOSPITAL_COMMUNITY): Payer: Self-pay | Admitting: Emergency Medicine

## 2015-09-18 ENCOUNTER — Non-Acute Institutional Stay (HOSPITAL_COMMUNITY)
Admission: EM | Admit: 2015-09-18 | Discharge: 2015-09-18 | Disposition: A | Discharge status: Home / Self Care | Payer: Medicare Other | Attending: Emergency Medicine | Admitting: Emergency Medicine

## 2015-09-18 DIAGNOSIS — D649 Anemia, unspecified: Secondary | ICD-10-CM

## 2015-09-18 DIAGNOSIS — R112 Nausea with vomiting, unspecified: Secondary | ICD-10-CM

## 2015-09-18 DIAGNOSIS — R0602 Shortness of breath: Secondary | ICD-10-CM

## 2015-09-18 DIAGNOSIS — I120 Hypertensive chronic kidney disease with stage 5 chronic kidney disease or end stage renal disease: Secondary | ICD-10-CM | POA: Insufficient documentation

## 2015-09-18 DIAGNOSIS — I150 Renovascular hypertension: Secondary | ICD-10-CM

## 2015-09-18 DIAGNOSIS — Z86711 Personal history of pulmonary embolism: Secondary | ICD-10-CM | POA: Diagnosis not present

## 2015-09-18 DIAGNOSIS — R0789 Other chest pain: Secondary | ICD-10-CM

## 2015-09-18 DIAGNOSIS — Z7901 Long term (current) use of anticoagulants: Secondary | ICD-10-CM | POA: Diagnosis not present

## 2015-09-18 DIAGNOSIS — Z87891 Personal history of nicotine dependence: Secondary | ICD-10-CM | POA: Diagnosis not present

## 2015-09-18 DIAGNOSIS — Z79899 Other long term (current) drug therapy: Secondary | ICD-10-CM | POA: Diagnosis not present

## 2015-09-18 DIAGNOSIS — Z992 Dependence on renal dialysis: Secondary | ICD-10-CM | POA: Diagnosis not present

## 2015-09-18 DIAGNOSIS — N186 End stage renal disease: Secondary | ICD-10-CM | POA: Insufficient documentation

## 2015-09-18 DIAGNOSIS — Z86718 Personal history of other venous thrombosis and embolism: Secondary | ICD-10-CM | POA: Diagnosis not present

## 2015-09-18 DIAGNOSIS — G8929 Other chronic pain: Secondary | ICD-10-CM

## 2015-09-18 LAB — PROTIME-INR
INR: 1.83 — ABNORMAL HIGH (ref 0.00–1.49)
Prothrombin Time: 21.1 seconds — ABNORMAL HIGH (ref 11.6–15.2)

## 2015-09-18 LAB — COMPREHENSIVE METABOLIC PANEL
ALBUMIN: 3 g/dL — AB (ref 3.5–5.0)
ALT: 19 U/L (ref 17–63)
AST: 50 U/L — AB (ref 15–41)
Alkaline Phosphatase: 127 U/L — ABNORMAL HIGH (ref 38–126)
Anion gap: 15 (ref 5–15)
BILIRUBIN TOTAL: 0.5 mg/dL (ref 0.3–1.2)
BUN: 65 mg/dL — AB (ref 6–20)
CHLORIDE: 100 mmol/L — AB (ref 101–111)
CO2: 21 mmol/L — ABNORMAL LOW (ref 22–32)
Calcium: 8.4 mg/dL — ABNORMAL LOW (ref 8.9–10.3)
Creatinine, Ser: 11.96 mg/dL — ABNORMAL HIGH (ref 0.61–1.24)
GFR calc Af Amer: 5 mL/min — ABNORMAL LOW (ref >60)
GFR calc non Af Amer: 4 mL/min — ABNORMAL LOW (ref >60)
GLUCOSE: 79 mg/dL (ref 65–99)
POTASSIUM: 4.6 mmol/L (ref 3.5–5.1)
Sodium: 136 mmol/L (ref 135–145)
TOTAL PROTEIN: 7.3 g/dL (ref 6.5–8.1)

## 2015-09-18 LAB — RENAL FUNCTION PANEL
ALBUMIN: 2.8 g/dL — AB (ref 3.5–5.0)
Anion gap: 14 (ref 5–15)
BUN: 68 mg/dL — ABNORMAL HIGH (ref 6–20)
CHLORIDE: 100 mmol/L — AB (ref 101–111)
CO2: 23 mmol/L (ref 22–32)
CREATININE: 12.42 mg/dL — AB (ref 0.61–1.24)
Calcium: 8.4 mg/dL — ABNORMAL LOW (ref 8.9–10.3)
GFR, EST AFRICAN AMERICAN: 5 mL/min — AB (ref >60)
GFR, EST NON AFRICAN AMERICAN: 4 mL/min — AB (ref >60)
Glucose, Bld: 107 mg/dL — ABNORMAL HIGH (ref 65–99)
PHOSPHORUS: 5.5 mg/dL — AB (ref 2.5–4.6)
POTASSIUM: 4.5 mmol/L (ref 3.5–5.1)
Sodium: 137 mmol/L (ref 135–145)

## 2015-09-18 LAB — CBC
HEMATOCRIT: 24.7 % — AB (ref 39.0–52.0)
HEMATOCRIT: 23.3 % — AB (ref 39.0–52.0)
Hemoglobin: 7.8 g/dL — ABNORMAL LOW (ref 13.0–17.0)
Hemoglobin: 7.7 g/dL — ABNORMAL LOW (ref 13.0–17.0)
MCH: 27.4 pg (ref 26.0–34.0)
MCH: 28.7 pg (ref 26.0–34.0)
MCHC: 31.6 g/dL (ref 30.0–36.0)
MCHC: 33 g/dL (ref 30.0–36.0)
MCV: 86.7 fL (ref 78.0–100.0)
MCV: 86.9 fL (ref 78.0–100.0)
Platelets: 184 10*3/uL (ref 150–400)
Platelets: 155 10*3/uL (ref 150–400)
RBC: 2.85 MIL/uL — ABNORMAL LOW (ref 4.22–5.81)
RBC: 2.68 MIL/uL — AB (ref 4.22–5.81)
RDW: 14.7 % (ref 11.5–15.5)
RDW: 14.8 % (ref 11.5–15.5)
WBC: 6.6 10*3/uL (ref 4.0–10.5)
WBC: 5.6 10*3/uL (ref 4.0–10.5)

## 2015-09-18 LAB — I-STAT CHEM 8, ED
BUN: 55 mg/dL — AB (ref 6–20)
CHLORIDE: 96 mmol/L — AB (ref 101–111)
CREATININE: 8.8 mg/dL — AB (ref 0.61–1.24)
Calcium, Ion: 1.08 mmol/L — ABNORMAL LOW (ref 1.12–1.23)
Glucose, Bld: 86 mg/dL (ref 65–99)
HEMATOCRIT: 26 % — AB (ref 39.0–52.0)
Hemoglobin: 8.8 g/dL — ABNORMAL LOW (ref 13.0–17.0)
POTASSIUM: 3.9 mmol/L (ref 3.5–5.1)
Sodium: 138 mmol/L (ref 135–145)
TCO2: 25 mmol/L (ref 0–100)

## 2015-09-18 MED ORDER — HEPARIN SODIUM (PORCINE) 1000 UNIT/ML DIALYSIS: 1000.0000 [IU] | INTRAMUSCULAR | Status: DC | PRN

## 2015-09-18 MED ORDER — FENTANYL CITRATE (PF) 100 MCG/2ML IJ SOLN
50.0000 ug | Freq: Once | INTRAMUSCULAR | Status: AC
  Administered 2015-09-18: 50 ug via INTRAVENOUS
  Filled 2015-09-18: qty 2

## 2015-09-18 MED ORDER — SODIUM CHLORIDE 0.9 % IV SOLN: 100.0000 mL | INTRAVENOUS | Status: DC | PRN

## 2015-09-18 MED ORDER — ALTEPLASE 2 MG IJ SOLR: 2.0000 mg | Freq: Once | INTRAMUSCULAR | Status: DC | PRN

## 2015-09-18 MED ORDER — LIDOCAINE HCL (PF) 1 % IJ SOLN: 5.0000 mL | INTRAMUSCULAR | Status: DC | PRN

## 2015-09-18 MED ORDER — ONDANSETRON HCL 4 MG/2ML IJ SOLN
4.0000 mg | Freq: Once | INTRAMUSCULAR | Status: AC
  Administered 2015-09-18: 4 mg via INTRAVENOUS
  Filled 2015-09-18: qty 2

## 2015-09-18 MED ORDER — PROMETHAZINE HCL 25 MG/ML IJ SOLN
25.0000 mg | Freq: Once | INTRAMUSCULAR | Status: AC
  Administered 2015-09-18: 25 mg via INTRAVENOUS
  Filled 2015-09-18: qty 1

## 2015-09-18 MED ORDER — LIDOCAINE-PRILOCAINE 2.5-2.5 % EX CREA: 1.0000 "application " | TOPICAL_CREAM | CUTANEOUS | Status: DC | PRN

## 2015-09-18 MED ORDER — PENTAFLUOROPROP-TETRAFLUOROETH EX AERO: 1.0000 "application " | INHALATION_SPRAY | CUTANEOUS | Status: DC | PRN

## 2015-09-18 NOTE — Progress Notes (Addendum)
Patient c/o chest pain, 8 out 10 on 0-10 pain scale.  Dr. Justin Mend called and no orders received for pain medication. Patient placed on 2L O2 [place] and given 17m NS and UF turned off.  Per Dr. WJustin Mend patient given the option to have blood returned and be discharged from Hemodialysis and taken back down to ED and re-checked in to have chest pain evaluated and treated.  Patient refused and states, " I just need something to eat". Will continue to monitor patient.  1500 Patient states chest pain has decreased but fails to rate the pain using 0-10 pain scale. Patient denies SOB and no ECG changes are noted on monitor, patient is in NSR, HR 84.  Patient is now requesting a [place] and states, " don't worry about the chest pain, it is under control".  1530 Patient asleep, remains in NSR HR 86 on the monitor, 2L [place], no signs of distress.  Will continue to monitor patient.  1630 Patient terminated treatment early against medical advise, SBP 184/129, HR 86, 96%RA and Respirations 24.  Patient refused to sign AMA paper work. Patient denies chest pain at this time, states, " The pain has moved to my stomach because I am hungry".  Patient Alert and oriented and discharged to home following hemodialysis treatment per orders.  Patient left will all belongings.

## 2015-09-18 NOTE — ED Notes (Signed)
Pt complaining of nausea and reporting that "you all have not done anything for my nausea or my pain." assured patient that he has received 25 mg of phenergan as well as 4 mg of zofran. Pt states "that shit hasn't helped." PA made aware. No recommendations at this time.

## 2015-09-18 NOTE — ED Notes (Signed)
Placed diet tray

## 2015-09-18 NOTE — ED Notes (Signed)
Reports SOB and chest pain.  States I am here for dialysis.  Seen two days ago for the same.  States I felt better after dialysis.

## 2015-09-18 NOTE — ED Provider Notes (Signed)
CSN: [id number]     Arrival date & time 09/18/15  6606 History   First MD Initiated Contact with Patient 09/18/15 [phone]     Chief Complaint  Patient presents with  . Shortness of Breath  . Chest Pain     (Consider location/radiation/quality/duration/timing/severity/associated sxs/prior Treatment) HPI Comments: Frank [name] is a 50 y.o. male with a PMHx of HTN, renal failure s/p failed kidney transplant now ESRD on dialysis Tue/Thurs/Sat, chronic anemia, CHF, prior PE on coumadin, chronic CP/SOB, depression, DM2, and anxiety, who presents to the ED with complaints of needing dialysis. He states that he was told on Saturday to come back today for his routine dialysis. He also states that he has his "typical chest pain and shortness of breath" which he gets when he needs dialysis. He describes the chest pain is 8/10 constant central aching, nonradiating, worse with movement and breathing, with no treatments tried prior to arrival. He also reports nausea and 5 episodes of nonbloody nonbilious emesis. He is on Coumadin for prior PE, takes it as he is instructed. He denies any new changes in any of his symptoms. States he feels like he needs dialysis.  He denies any fevers, chills, diaphoresis, lightheadedness, worsening leg swelling, recent travel/surgery/immobilization, abdominal pain, hematochezia, melena, hematemesis, diarrhea, constipation, numbness, tingling, weakness, or smoking. He does not produce urine. He does not have a dialysis center  Patient is a 50 y.o. male presenting with shortness of breath and chest pain. The history is provided by the patient and medical records. No language interpreter was used.  Shortness of Breath Associated symptoms: chest pain and vomiting   Associated symptoms: no abdominal pain, no diaphoresis and no fever   Chest Pain Pain location:  Substernal area Pain quality: aching   Pain radiates to:  Does not radiate Pain radiates to the back: no   Pain severity:   Moderate Onset quality:  Gradual Duration:  2 days Timing:  Constant Progression:  Unchanged Chronicity:  Chronic Context: at rest   Relieved by:  None tried Worsened by:  Deep breathing and movement Ineffective treatments:  None tried Associated symptoms: nausea, shortness of breath and vomiting   Associated symptoms: no abdominal pain, no diaphoresis, no fever, no lower extremity edema (unchanged), no numbness and no weakness   Risk factors: diabetes mellitus, hypertension, male sex and prior DVT/PE   Risk factors: no coronary artery disease, no immobilization, no smoking and no surgery     Past Medical History  Diagnosis Date  . Hypertension   . Depression   . Complication of anesthesia     itching, sore throat  . Diabetes mellitus without complication ([place])     No history per patient, but remains under history as A1c would not be accurate given on dialysis  . Shortness of breath   . Anxiety   . ESRD (end stage renal disease) ([place])     due to HTN per patient, followed at [facility], s/p failed kidney transplant - dialysis Tue, Th, Sat  . Renal insufficiency   . CHF (congestive heart failure) ([place])   . Anemia   . Dialysis patient [facility])    Past Surgical History  Procedure Laterality Date  . Kidney receipient  2006    failed and started HD in March 2014  . Capd insertion    . Capd removal    . Left heart catheterization with coronary angiogram N/A 09/02/2014    Procedure: LEFT HEART CATHETERIZATION WITH CORONARY ANGIOGRAM;  Surgeon: Leonie Green  Ellyn Hack, MD;  Location: [facility] CATH LAB;  Service: Cardiovascular;  Laterality: N/A;  . Inguinal hernia repair Right 02/14/2015    Procedure: REPAIR INCARCERATED RIGHT INGUINAL HERNIA;  Surgeon: [name], MD;  Location: [place];  Service: General;  Laterality: Right;   Family History  Problem Relation Age of Onset  . Hypertension Other    Social History  Substance Use Topics  . Smoking status: Former Smoker -- 0.00 packs/day for 1 years      Types: Cigarettes  . Smokeless tobacco: Never Used     Comment: quit Jan 2014  . Alcohol Use: No    Review of Systems  Constitutional: Negative for fever, chills and diaphoresis.  Respiratory: Positive for shortness of breath.   Cardiovascular: Positive for chest pain. Negative for leg swelling.  Gastrointestinal: Positive for nausea and vomiting. Negative for abdominal pain, diarrhea, constipation and blood in stool.  Genitourinary:       Does not produce urine  Musculoskeletal: Negative for myalgias and arthralgias.  Skin: Negative for color change.  Allergic/Immunologic: Positive for immunocompromised state (diabetic, ESRD on dialysis).  Neurological: Negative for weakness, light-headedness and numbness.  Psychiatric/Behavioral: Negative for confusion.   10 Systems reviewed and are negative for acute change except as noted in the HPI.    Allergies  Butalbital-apap-caffeine; Ferrlecit; Minoxidil; Darvocet; and Propoxyphene  Home Medications   Prior to Admission medications   Medication Sig Start Date End Date Taking? Authorizing Provider  ALPRAZolam [name]) 0.5 MG tablet Take 1 tablet (0.5 mg total) by mouth 2 (two) times daily as needed for anxiety. 09/11/15   [name], MD  amLODipine (NORVASC) 10 MG tablet Take 10 mg by mouth daily.    Historical Provider, MD  atorvastatin (LIPITOR) 40 MG tablet Take 1 tablet (40 mg total) by mouth daily at 6 PM. 09/02/14   Barton Dubois, MD  carvedilol (COREG) 25 MG tablet Take 1 tablet (25 mg total) by mouth 2 (two) times daily with a meal. 10/10/14   [name], DO  cinacalcet (SENSIPAR) 30 MG tablet Take 30 mg by mouth daily.    Historical Provider, MD  docusate sodium (COLACE) 100 MG capsule Take 1 capsule (100 mg total) by mouth 2 (two) times daily. 11/19/14   [name], MD  doxercalciferol (HECTOROL) 4 MCG/2ML injection Inject 1.25 mLs (2.5 mcg total) into the vein Every Tuesday,Thursday,and Saturday with dialysis.  10/10/14   [name], DO  gabapentin (NEURONTIN) 100 MG capsule Take 100 mg by mouth 3 (three) times daily. 06/14/15 09/12/15  Historical Provider, MD  hydrALAZINE (APRESOLINE) 50 MG tablet Take 2 tablets (100 mg total) by mouth every 8 (eight) hours. 09/11/15   [name], MD  hydrocerin (EUCERIN) CREA Apply 1 application topically 2 (two) times daily. 02/04/15   [name], MD  HYDROcodone-acetaminophen (NORCO/VICODIN) 5-325 MG tablet Take 1 tablet by mouth every 6 (six) hours as needed. 09/11/15   [name], MD  isosorbide mononitrate (IMDUR) 30 MG 24 hr tablet Take 3 tablets (90 mg total) by mouth daily. 09/11/15   [name], MD  levalbuterol [facility] HFA) 45 MCG/ACT inhaler Inhale 2 puffs into the lungs every 6 (six) hours as needed for wheezing or shortness of breath. 11/19/14   [name], MD  lisinopril (PRINIVIL,ZESTRIL) 20 MG tablet Take 1 tablet (20 mg total) by mouth daily. 02/04/15   [name], MD  multivitamin (RENA-VIT) TABS tablet Take 1 tablet by mouth at  bedtime. 02/04/15   [name], MD  omeprazole (PRILOSEC) 20 MG capsule Take 20 mg by mouth daily. 07/01/13   Historical Provider, MD  predniSONE (DELTASONE) 5 MG tablet Take 5 mg by mouth daily with breakfast.    Historical Provider, MD  sevelamer carbonate (RENVELA) 800 MG tablet Take 800-1,600 mg by mouth 3 (three) times daily with meals. 2 tabs three times daily with meals, and 1 tablet with snacks    Historical Provider, MD  warfarin (COUMADIN) 6 MG tablet Take 1.5 tablets (9 mg total) by mouth daily at 6 PM. Take daily until adjusted per MD 09/12/15   [name], MD   BP 187/147 mmHg  Pulse 87  Temp(Src) 98.2 F (36.8 C) (Oral)  Resp 16  Ht _0  (1.88 m)  Wt 74.844 kg  BMI 21.18 kg/m2  SpO2 100% Physical Exam  Constitutional: He is oriented to person, place, and time. Vital signs are normal. He appears well-developed and well-nourished.  Non-toxic appearance. No distress.  Afebrile,  nontoxic, NAD. HTN noted which is similar to all prior ER visits  HENT:  Head: Normocephalic and atraumatic.  Mouth/Throat: Oropharynx is clear and moist and mucous membranes are normal.  Eyes: Conjunctivae and EOM are normal. Right eye exhibits no discharge. Left eye exhibits no discharge.  Neck: Normal range of motion. Neck supple.  Cardiovascular: Normal rate, regular rhythm, normal heart sounds and intact distal pulses.  Exam reveals no gallop and no friction rub.   No murmur heard. RRR, nl s1/s2, no m/r/g, distal pulses intact, trace b/l pedal edema   Pulmonary/Chest: Effort normal and breath sounds normal. No respiratory distress. He has no decreased breath sounds. He has no wheezes. He has no rhonchi. He has no rales. He exhibits tenderness. He exhibits no crepitus, no deformity and no retraction.    CTAB in all lung fields, no w/r/r, no hypoxia or increased WOB, speaking in full sentences, SpO2 100% on RA Chest wall with mild TTP over the sternum, without crepitus, deformities, or retractions. Dialysis access to R chest wall  Abdominal: Soft. Normal appearance and bowel sounds are normal. He exhibits no distension. There is no tenderness. There is no rigidity, no rebound, no guarding, no CVA tenderness, no tenderness at McBurney's point and negative Murphy's sign.  Musculoskeletal: Normal range of motion.  MAE x4 Strength and sensation grossly intact Distal pulses intact Gait steady Trace b/l pedal edema, neg homan's bilaterally   Neurological: He is alert and oriented to person, place, and time. He has normal strength. No sensory deficit.  Skin: Skin is warm, dry and intact. No rash noted.  Psychiatric: He has a normal mood and affect.  Nursing note and vitals reviewed.   ED Course  Procedures (including critical care time) Labs Review Labs Reviewed  CBC - Abnormal; Notable for the following:    RBC 2.85 (*)    Hemoglobin 7.8 (*)    HCT 24.7 (*)    All other components  within normal limits  COMPREHENSIVE METABOLIC PANEL  PROTIME-INR    Imaging Review No results found.   Echo 08/2015: LVEF 20-25%, grade II diastolic dysfunction  LHC 08/2014: minimal CAD  Exercise tolerance test 08/2014:  Overall Impression: Nondiagnostic stress test. Conclusions Lexiscan myoview completed without complications, + SOB resolved in recovery. Nuc results to follow. No ECG evidence of ischemia with pharmacologic stress Confirmed by Irish Lack MD, JAYADEEP ([id number]) on 09/01/2014 9:45:42 PM  MyoView 08/2014: IMPRESSION: 1. Prior inferior wall infarct with peri-infarct  ischemia and moderate reversible defect along the lateral wall. Additional small to moderate reversible defect along the anterior wall. 2. Global hypokinesis with associated left ventricular dilatation. 3. Left ventricular ejection fraction 35% 4. High-risk stress test findings*. *2012 Appropriate Use Criteria for Coronary Revascularization Focused Update: J Am Coll Cardiol. 6834;19(6):222-979. http://content.airportbarriers.com.aspx?articleid=[email or web address] These results will be called to the ordering clinician or representative by the Radiologist Assistant, and communication documented in the PACS or zVision Dashboard.  Electronically Signed  By: Julian Hy M.D.  On: 09/01/2014 16:36  I have personally reviewed and evaluated these images and lab results as part of my medical decision-making.   EKG Interpretation   Date/Time:  Monday September 18 2015 06:37:52 EST Ventricular Rate:  83 PR Interval:  177 QRS Duration: 93 QT Interval:  420 QTC Calculation: 493 R Axis:   -32 Text Interpretation:  Sinus rhythm Probable left atrial enlargement Left  ventricular hypertrophy Borderline prolonged QT interval Baseline wander  in lead(s) V3 Unchanged from 2/17 Confirmed by HORTON  MD, COURTNEY  ([id number]) on 09/18/2015 7:17:21 AM      MDM   Final diagnoses:  ESRD on hemodialysis (HCC)    Renovascular hypertension  Chronic chest wall pain  SOB (shortness of breath)  Nausea and vomiting in adult  Chronic anemia    50 y.o. male here for dialysis. States he's here because dialysis center told him to come. Also states he has his "typical CP and SOB" that he gets when he needs dialysis. CP is reproducible on exam, likely the chest wall pain he has been admitted for/seen for in the past. No hypoxia or tachycardia, pt on coumadin therefore PE less likely. EKG today unchanged from 09/08/15. Has had LHC last year with minimal CAD, cardiology during his last admission stated that he has a chronically elevated trop likely due to being on dialysis. They felt he didn't need another cath or myoview/stress test this year. He likely just needs dialysis today, which is what he states he's here for. Will obtain CBC, CMP, and INR. Doubt need for troponin given that his EKG is unchanged and no acute changes in his symptoms. Doubt need for BNP or CXR. Will get labs and then call nephrology for his dialysis. I discussed case with my attending Dr. Dina Rich who agrees with work up/plan.   7:28 AM Dr. Justin Mend returning page, will call dialysis unit and get him to dialysis. CBC showing chronic stable anemia, other labs still pending, which was relayed to Dr. Justin Mend, who is aware of this. Please see Dr. Jason Nest notes for further documentation of care/dispo. He will be discharged from dialysis. He is in stable condition at this time.   BP 187/147 mmHg  Pulse 87  Temp(Src) 98.2 F (36.8 C) (Oral)  Resp 16  Ht 6' 2" (1.88 m)  Wt 74.844 kg  BMI 21.18 kg/m2  SpO2 100%  Meds ordered this encounter  Medications  . promethazine (PHENERGAN) injection 25 mg    Sig:   . fentaNYL (SUBLIMAZE) injection 50 mcg    Sig:      Maston Wight Camprubi-Soms, PA-C 09/18/15 [id number]  Merryl Hacker, MD 09/20/15 [phone]

## 2015-09-19 ENCOUNTER — Encounter (HOSPITAL_COMMUNITY): Payer: Self-pay | Admitting: Emergency Medicine

## 2015-09-19 ENCOUNTER — Emergency Department (HOSPITAL_COMMUNITY): Payer: Medicare Other

## 2015-09-19 ENCOUNTER — Emergency Department (HOSPITAL_COMMUNITY)
Admission: EM | Admit: 2015-09-19 | Discharge: 2015-09-20 | Disposition: A | Discharge status: Home / Self Care | Payer: Medicare Other | Attending: Emergency Medicine | Admitting: Emergency Medicine

## 2015-09-19 DIAGNOSIS — Z7901 Long term (current) use of anticoagulants: Secondary | ICD-10-CM | POA: Insufficient documentation

## 2015-09-19 DIAGNOSIS — F329 Major depressive disorder, single episode, unspecified: Secondary | ICD-10-CM | POA: Diagnosis not present

## 2015-09-19 DIAGNOSIS — R0602 Shortness of breath: Secondary | ICD-10-CM | POA: Diagnosis not present

## 2015-09-19 DIAGNOSIS — Z7952 Long term (current) use of systemic steroids: Secondary | ICD-10-CM | POA: Diagnosis not present

## 2015-09-19 DIAGNOSIS — R079 Chest pain, unspecified: Secondary | ICD-10-CM | POA: Insufficient documentation

## 2015-09-19 DIAGNOSIS — I509 Heart failure, unspecified: Secondary | ICD-10-CM | POA: Insufficient documentation

## 2015-09-19 DIAGNOSIS — F419 Anxiety disorder, unspecified: Secondary | ICD-10-CM | POA: Diagnosis not present

## 2015-09-19 DIAGNOSIS — N186 End stage renal disease: Secondary | ICD-10-CM | POA: Diagnosis not present

## 2015-09-19 DIAGNOSIS — Z9889 Other specified postprocedural states: Secondary | ICD-10-CM | POA: Diagnosis not present

## 2015-09-19 DIAGNOSIS — Z992 Dependence on renal dialysis: Secondary | ICD-10-CM | POA: Diagnosis not present

## 2015-09-19 DIAGNOSIS — E119 Type 2 diabetes mellitus without complications: Secondary | ICD-10-CM | POA: Insufficient documentation

## 2015-09-19 DIAGNOSIS — Z862 Personal history of diseases of the blood and blood-forming organs and certain disorders involving the immune mechanism: Secondary | ICD-10-CM | POA: Diagnosis not present

## 2015-09-19 DIAGNOSIS — R05 Cough: Secondary | ICD-10-CM | POA: Diagnosis not present

## 2015-09-19 DIAGNOSIS — Z79899 Other long term (current) drug therapy: Secondary | ICD-10-CM | POA: Diagnosis not present

## 2015-09-19 DIAGNOSIS — I120 Hypertensive chronic kidney disease with stage 5 chronic kidney disease or end stage renal disease: Secondary | ICD-10-CM | POA: Insufficient documentation

## 2015-09-19 DIAGNOSIS — R072 Precordial pain: Secondary | ICD-10-CM | POA: Diagnosis not present

## 2015-09-19 DIAGNOSIS — Z87891 Personal history of nicotine dependence: Secondary | ICD-10-CM | POA: Diagnosis not present

## 2015-09-19 LAB — I-STAT TROPONIN, ED: TROPONIN I, POC: 0.05 ng/mL (ref 0.00–0.08)

## 2015-09-19 LAB — BASIC METABOLIC PANEL
ANION GAP: 10 (ref 5–15)
BUN: 49 mg/dL — ABNORMAL HIGH (ref 6–20)
CALCIUM: 8.3 mg/dL — AB (ref 8.9–10.3)
CO2: 26 mmol/L (ref 22–32)
Chloride: 101 mmol/L (ref 101–111)
Creatinine, Ser: 11.21 mg/dL — ABNORMAL HIGH (ref 0.61–1.24)
GFR, EST AFRICAN AMERICAN: 5 mL/min — AB (ref >60)
GFR, EST NON AFRICAN AMERICAN: 5 mL/min — AB (ref >60)
Glucose, Bld: 158 mg/dL — ABNORMAL HIGH (ref 65–99)
Potassium: 4.8 mmol/L (ref 3.5–5.1)
Sodium: 137 mmol/L (ref 135–145)

## 2015-09-19 LAB — CBC
HCT: 21.1 % — ABNORMAL LOW (ref 39.0–52.0)
HEMOGLOBIN: 6.8 g/dL — AB (ref 13.0–17.0)
MCH: 27.5 pg (ref 26.0–34.0)
MCHC: 32.2 g/dL (ref 30.0–36.0)
MCV: 85.4 fL (ref 78.0–100.0)
Platelets: 160 10*3/uL (ref 150–400)
RBC: 2.47 MIL/uL — AB (ref 4.22–5.81)
RDW: 14.6 % (ref 11.5–15.5)
WBC: 4.7 10*3/uL (ref 4.0–10.5)

## 2015-09-19 NOTE — ED Notes (Signed)
HGB 6.8 received from lab- EDP aware.
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
## 2015-09-19 NOTE — ED Notes (Signed)
Pt reports he has been having central chest pain since waking up this am. Pt also reports sob and n/v. Pt had dialysis yesterday.

## 2015-09-20 MED ORDER — METHOCARBAMOL 500 MG PO TABS
500.0000 mg | ORAL_TABLET | Freq: Once | ORAL | Status: AC
  Administered 2015-09-20: 500 mg via ORAL
  Filled 2015-09-20: qty 1

## 2015-09-20 MED ORDER — ONDANSETRON 4 MG PO TBDP
4.0000 mg | ORAL_TABLET | Freq: Once | ORAL | Status: AC
Start: 2015-09-20 — End: 2015-09-20
  Administered 2015-09-20: 4 mg via ORAL
  Filled 2015-09-20: qty 1

## 2015-09-20 NOTE — ED Notes (Signed)
Lahoma Crocker, PA at bedside speaking with patient at this time.

## 2015-09-20 NOTE — Discharge Instructions (Signed)
Go to your dialysis Center in 2 days for your regularly scheduled dialysis. Return to the emergency department with new, worsening or concerning symptoms.   Chest Pain Observation It is often hard to give a specific diagnosis for the cause of chest pain. Among other possibilities your symptoms might be caused by inadequate oxygen delivery to your heart (angina). Angina that is not treated or evaluated can lead to a heart attack (myocardial infarction) or death. Blood tests, electrocardiograms, and X-rays may have been done to help determine a possible cause of your chest pain. After evaluation and observation, your health care provider has determined that it is unlikely your pain was caused by an unstable condition that requires hospitalization. However, a full evaluation of your pain may need to be completed, with additional diagnostic testing as directed. It is very important to keep your follow-up appointments. Not keeping your follow-up appointments could result in permanent heart damage, disability, or death. If there is any problem keeping your follow-up appointments, you must call your health care provider. HOME CARE INSTRUCTIONS  Due to the slight chance that your pain could be angina, it is important to follow your health care provider's treatment plan and also maintain a healthy lifestyle:  Maintain or work toward achieving a healthy weight.  Stay physically active and exercise regularly.  Decrease your salt intake.  Eat a balanced, healthy diet. Talk to a dietitian to learn about heart-healthy foods.  Increase your fiber intake by including whole grains, vegetables, fruits, and nuts in your diet.  Avoid situations that cause stress, anger, or depression.  Take medicines as advised by your health care provider. Report any side effects to your health care provider. Do not stop medicines or adjust the dosages on your own.  Quit smoking. Do not use nicotine patches or gum until you  check with your health care provider.  Keep your blood pressure, blood sugar, and cholesterol levels within normal limits.  Limit alcohol intake to no more than 1 drink per day for women who are not pregnant and 2 drinks per day for men.  Do not abuse drugs. SEEK IMMEDIATE MEDICAL CARE IF: You have severe chest pain or pressure which may include symptoms such as:  You feel pain or pressure in your arms, neck, jaw, or back.  You have severe back or abdominal pain, feel sick to your stomach (nauseous), or throw up (vomit).  You are sweating profusely.  You are having a fast or irregular heartbeat.  You feel short of breath while at rest.  You notice increasing shortness of breath during rest, sleep, or with activity.  You have chest pain that does not get better after rest or after taking your usual medicine.  You wake from sleep with chest pain.  You are unable to sleep because you cannot breathe.  You develop a frequent cough or you are coughing up blood.  You feel dizzy, faint, or experience extreme fatigue.  You develop severe weakness, dizziness, fainting, or chills. Any of these symptoms may represent a serious problem that is an emergency. Do not wait to see if the symptoms will go away. Call your local emergency services (911 in the [place] Do not drive yourself to the hospital. MAKE SURE YOU:  Understand these instructions.  Will watch your condition.  Will get help right away if you are not doing well or get worse.   This information is not intended to replace advice given to you by your health care provider. Make  sure you discuss any questions you have with your health care provider.   Document Released: 08/10/2010 Document Revised: 07/13/2013 Document Reviewed: 01/07/2013 Elsevier Interactive Patient Education 2016 [employer]  Chronic Kidney Disease Chronic kidney disease occurs when the kidneys are damaged over a long period. The kidneys are two organs  that lie on either side of the spine between the middle of the back and the front of the abdomen. The kidneys:  Remove wastes and extra water from the blood.  Produce important hormones. These help keep bones strong, regulate blood pressure, and help create red blood cells.  Balance the fluids and chemicals in the blood and tissues. A small amount of kidney damage may not cause problems, but a large amount of damage may make it difficult or impossible for the kidneys to work the way they should. If steps are not taken to slow down the kidney damage or stop it from getting worse, the kidneys may stop working permanently. Most of the time, chronic kidney disease does not go away. However, it can often be controlled, and those with the disease can usually live normal lives. CAUSES The most common causes of chronic kidney disease are diabetes and high blood pressure (hypertension). Chronic kidney disease may also be caused by:  Diseases that cause the kidneys' filters to become inflamed.  Diseases that affect the immune system.  Genetic diseases.  Medicines that damage the kidneys, such as anti-inflammatory medicines.  Poisoning or exposure to toxic substances.  A reoccurring kidney or urinary infection.  A problem with urine flow. This may be caused by:  Cancer.  Kidney stones.  An enlarged prostate in males. SIGNS AND SYMPTOMS Because the kidney damage in chronic kidney disease occurs slowly, symptoms develop slowly and may not be obvious until the kidney damage becomes severe. A person may have a kidney disease for years without showing any symptoms. Symptoms can include:  Swelling (edema) of the legs, ankles, or feet.  Tiredness (lethargy).  Nausea or vomiting.  Confusion.  Problems with urination, such as:  Decreased urine production.  Frequent urination, especially at night.  Frequent accidents in children who are potty trained.  Muscle twitches and  cramps.  Shortness of breath.  Weakness.  Persistent itchiness.  Loss of appetite.  Metallic taste in the mouth.  Trouble sleeping.  Slowed development in children.  Short stature in children. DIAGNOSIS Chronic kidney disease may be detected and diagnosed by tests, including blood, urine, imaging, or kidney biopsy tests. TREATMENT Most chronic kidney diseases cannot be cured. Treatment usually involves relieving symptoms and preventing or slowing the progression of the disease. Treatment may include:  A special diet. You may need to avoid alcohol and foods thatare salty and high in potassium.  Medicines. These may:  Lower blood pressure.  Relieve anemia.  Relieve swelling.  Protect the bones. HOME CARE INSTRUCTIONS  Follow your prescribed diet. Your health care provider may instruct you to limit daily salt (sodium) and protein intake.  Take medicines only as directed by your health care provider. Do not take any new medicines (prescription, over-the-counter, or nutritional supplements) unless approved by your health care provider. Many medicines can worsen your kidney damage or need to have the dose adjusted.   Quit smoking if you smoke. Talk to your health care provider about a smoking cessation program.  Keep all follow-up visits as directed by your health care provider.  Monitor your blood pressure.  Start or continue an exercise plan.  Get immunizations as  directed by your health care provider.  Take vitamin and mineral supplements as directed by your health care provider. SEEK IMMEDIATE MEDICAL CARE IF:  Your symptoms get worse or you develop new symptoms.  You develop symptoms of end-stage kidney disease. These include:  Headaches.  Abnormally dark or light skin.  Numbness in the hands or feet.  Easy bruising.  Frequent hiccups.  Menstruation stops.  You have a fever.  You have decreased urine production.  You havepain or bleeding when  urinating. MAKE SURE YOU:  Understand these instructions.  Will watch your condition.  Will get help right away if you are not doing well or get worse. FOR MORE INFORMATION   American Association of Kidney Patients: BombTimer.gl  National Kidney Foundation: www.kidney.[place]: https://mathis.com/  [facility]: www.lifeoptions.org and www.kidneyschool.org   This information is not intended to replace advice given to you by your health care provider. Make sure you discuss any questions you have with your health care provider.   Document Released: 04/16/2008 Document Revised: 07/29/2014 Document Reviewed: 03/06/2012 Elsevier Interactive Patient Education [employer].  Dialysis Dialysis is a procedure that replaces some of the work healthy kidneys do. It is done when you lose about 85-90% of your kidney function. It may also be done earlier if your symptoms may be improved by dialysis. During dialysis, wastes, salt, and extra water are removed from the blood, and the levels of certain chemicals in the blood (such as potassium) are maintained. Dialysis is done in sessions. Dialysis sessions are continued until the kidneys get better. If the kidneys cannot get better, such as in end-stage kidney disease, dialysis is continued for life or until you receive a new kidney (kidney transplant). There are two types of dialysis: hemodialysis and peritoneal dialysis. WHAT IS HEMODIALYSIS?  Hemodialysis is a type of dialysis in which a machine called a dialyzer is used to filter the blood. Before beginning hemodialysis, you will have surgery to create a site where blood can be removed from the body and returned to the body (vascular access). There are three types of vascular accesses:  Arteriovenous fistula. To create this type of access, an artery is connected to a vein (usually in the arm). A fistula takes 1-6 months to develop after surgery. If it develops  properly, it usually lasts longer than the other types of vascular accesses. It is also less likely to become infected and cause blood clots.  Arteriovenous graft. To create this type of access, an artery and a vein in the arm are connected with a tube. A graft may be used within 2-3 weeks of surgery.  A venous catheter. To create this type of access, a thin, flexible tube (catheter) is placed in a large vein in your neck, chest, or groin. A catheter may be used right away. It is usually used as a temporary access when dialysis needs to begin immediately. During hemodialysis, blood leaves the body through your access. It travels through a tube to the dialyzer, where it is filtered. The blood then returns to your body through another tube. Hemodialysis is usually performed by a health care provider at a hospital or dialysis center three times a week. Visits last about 3-4 hours. It may also be performed with the help of another person at home with training.  WHAT IS PERITONEAL DIALYSIS? Peritoneal dialysis is a type of dialysis in which the thin lining of the abdomen (peritoneum) is used as a filter. Before beginning  peritoneal dialysis, you will have surgery to place a catheter in your abdomen. The catheter will be used to transfer a fluid called dialysate to and from your abdomen. At the start of a session, your abdomen is filled with dialysate. During the session, wastes, salt, and extra water in the blood pass through the peritoneum and into the dialysate. The dialysate is drained from the body at the end of the session. The process of filling and draining the dialysate is called an exchange. Exchanges are repeated until you have used up all the dialysate for the day. Peritoneal dialysis may be performed by you at home or at almost any other location. It is done every day. You may need up to five exchanges a day. The amount of time the dialysate is in your body between exchanges is called a dwell. The  dwell depends on the number of exchanges needed and the characteristics of the peritoneum. It usually varies from 1.5-3 hours. You may go about your day normally between exchanges. Alternately, the exchanges may be done at night while you sleep, using a machine called a cycler. WHICH TYPE OF DIALYSIS SHOULD I CHOOSE?  Both hemodialysis and peritoneal dialysis have advantages and disadvantages. Talk to your health care provider about which type of dialysis would be best for you. Your lifestyle and preferences should be considered along with your medical condition. In some cases, only one type of dialysis may be an option.  Advantages of hemodialysis  It is done less often than peritoneal dialysis.  Someone else can do the dialysis for you.  If you go to a dialysis center, your health care provider will be able to recognize any problems right away.  If you go to a dialysis center, you can interact with others who are having dialysis. This can provide you with emotional support. Disadvantages of hemodialysis  Hemodialysis may cause cramps and low blood pressure. It may leave you feeling tired on the days you have the treatment.  If you go to a dialysis center, you will need to make weekly appointments and work around the center's schedule.  You will need to take extra care when traveling. If you go to a dialysis center, you will need to make special arrangements to visit a dialysis center near your destination. If you are having treatments at home, you will need to take the dialyzer with you to your destination.  You will need to avoid more foods than you would need to avoid on peritoneal dialysis. Advantages of peritoneal dialysis  It is less likely than hemodialysis to cause cramps and low blood pressure.  You may do exchanges on your own wherever you are, including when you travel.  You do not need to avoid as many foods as you do on hemodialysis. Disadvantages of peritoneal  dialysis  It is done more often than hemodialysis.  Performing peritoneal dialysis requires you to have dexterity of the hands. You must also be able to lift bags.  You will have to learn sterilization techniques. You will need to practice them every day to reduce the risk of infection. WHAT CHANGES WILL I NEED TO MAKE TO MY DIET DURING DIALYSIS? Both hemodialysis and peritoneal dialysis require you to make some changes to your diet. For example, you will need to limit your intake of foods high in the minerals phosphorus and potassium. You will also need to limit your fluid intake. Your dietitian can help you plan meals. A good meal plan can improve your  dialysis and your health.  WHAT SHOULD I EXPECT WHEN BEGINNING DIALYSIS? Adjusting to the dialysis treatment, schedule, and diet can take some time. You may need to stop working and may not be able to do some of the things you normally do. You may feel anxious or depressed when beginning dialysis. Eventually, many people feel better overall because of dialysis. Some people are able to return to work after making some changes, such as reducing work intensity. WHERE CAN I FIND MORE INFORMATION?   [place]: www.kidney.org  American Association of Kidney Patients: BombTimer.gl  American Kidney Fund: www.kidneyfund.org   This information is not intended to replace advice given to you by your health care provider. Make sure you discuss any questions you have with your health care provider.   Document Released: 09/28/2002 Document Revised: 07/29/2014 Document Reviewed: 09/01/2012 Elsevier Interactive Patient Education 2016 [employer].  Shortness of Breath Shortness of breath means you have trouble breathing. It could also mean that you have a medical problem. You should get immediate medical care for shortness of breath. CAUSES   Not enough oxygen in the air such as with high altitudes or a smoke-filled room.  Certain  lung diseases, infections, or problems.  Heart disease or conditions, such as angina or heart failure.  Low red blood cells (anemia).  Poor physical fitness, which can cause shortness of breath when you exercise.  Chest or back injuries or stiffness.  Being overweight.  Smoking.  Anxiety, which can make you feel like you are not getting enough air. DIAGNOSIS  Serious medical problems can often be found during your physical exam. Tests may also be done to determine why you are having shortness of breath. Tests may include:  Chest X-rays.  Lung function tests.  Blood tests.  An electrocardiogram (ECG).  An ambulatory electrocardiogram. An ambulatory ECG records your heartbeat patterns over a 24-hour period.  Exercise testing.  A transthoracic echocardiogram (TTE). During echocardiography, sound waves are used to evaluate how blood flows through your heart.  A transesophageal echocardiogram (TEE).  Imaging scans. Your health care provider may not be able to find a cause for your shortness of breath after your exam. In this case, it is important to have a follow-up exam with your health care provider as directed.  TREATMENT  Treatment for shortness of breath depends on the cause of your symptoms and can vary greatly. HOME CARE INSTRUCTIONS   Do not smoke. Smoking is a common cause of shortness of breath. If you smoke, ask for help to quit.  Avoid being around chemicals or things that may bother your breathing, such as paint fumes and dust.  Rest as needed. Slowly resume your usual activities.  If medicines were prescribed, take them as directed for the full length of time directed. This includes oxygen and any inhaled medicines.  Keep all follow-up appointments as directed by your health care provider. SEEK MEDICAL CARE IF:   Your condition does not improve in the time expected.  You have a hard time doing your normal activities even with rest.  You have any new  symptoms. SEEK IMMEDIATE MEDICAL CARE IF:   Your shortness of breath gets worse.  You feel light-headed, faint, or develop a cough not controlled with medicines.  You start coughing up blood.  You have pain with breathing.  You have chest pain or pain in your arms, shoulders, or abdomen.  You have a fever.  You are unable to walk up stairs or exercise  the way you normally do. MAKE SURE YOU:  Understand these instructions.  Will watch your condition.  Will get help right away if you are not doing well or get worse.   This information is not intended to replace advice given to you by your health care provider. Make sure you discuss any questions you have with your health care provider.   Document Released: 04/02/2001 Document Revised: 07/13/2013 Document Reviewed: 09/23/2011 Elsevier Interactive Patient Education [employer].

## 2015-09-21 ENCOUNTER — Non-Acute Institutional Stay (HOSPITAL_COMMUNITY)
Admission: EM | Admit: 2015-09-21 | Discharge: 2015-09-22 | Disposition: A | Discharge status: Home / Self Care | Payer: Medicare Other | Source: Home / Self Care | Attending: Emergency Medicine | Admitting: Emergency Medicine

## 2015-09-21 ENCOUNTER — Emergency Department (HOSPITAL_COMMUNITY): Payer: Medicare Other

## 2015-09-21 ENCOUNTER — Encounter (HOSPITAL_COMMUNITY): Payer: Self-pay | Admitting: *Deleted

## 2015-09-21 DIAGNOSIS — Z7901 Long term (current) use of anticoagulants: Secondary | ICD-10-CM

## 2015-09-21 DIAGNOSIS — Z87891 Personal history of nicotine dependence: Secondary | ICD-10-CM | POA: Insufficient documentation

## 2015-09-21 DIAGNOSIS — Z79899 Other long term (current) drug therapy: Secondary | ICD-10-CM

## 2015-09-21 DIAGNOSIS — I132 Hypertensive heart and chronic kidney disease with heart failure and with stage 5 chronic kidney disease, or end stage renal disease: Secondary | ICD-10-CM

## 2015-09-21 DIAGNOSIS — R0602 Shortness of breath: Secondary | ICD-10-CM | POA: Diagnosis not present

## 2015-09-21 DIAGNOSIS — F329 Major depressive disorder, single episode, unspecified: Secondary | ICD-10-CM

## 2015-09-21 DIAGNOSIS — I509 Heart failure, unspecified: Secondary | ICD-10-CM | POA: Insufficient documentation

## 2015-09-21 DIAGNOSIS — N186 End stage renal disease: Secondary | ICD-10-CM | POA: Insufficient documentation

## 2015-09-21 DIAGNOSIS — E1122 Type 2 diabetes mellitus with diabetic chronic kidney disease: Secondary | ICD-10-CM | POA: Insufficient documentation

## 2015-09-21 DIAGNOSIS — F419 Anxiety disorder, unspecified: Secondary | ICD-10-CM

## 2015-09-21 DIAGNOSIS — Z992 Dependence on renal dialysis: Secondary | ICD-10-CM

## 2015-09-21 DIAGNOSIS — M546 Pain in thoracic spine: Secondary | ICD-10-CM | POA: Diagnosis not present

## 2015-09-21 DIAGNOSIS — Z7952 Long term (current) use of systemic steroids: Secondary | ICD-10-CM

## 2015-09-21 DIAGNOSIS — M549 Dorsalgia, unspecified: Secondary | ICD-10-CM

## 2015-09-21 LAB — CBC WITH DIFFERENTIAL/PLATELET
Basophils Absolute: 0 10*3/uL (ref 0.0–0.1)
Basophils Relative: 0 %
Eosinophils Absolute: 0.1 10*3/uL (ref 0.0–0.7)
Eosinophils Relative: 1 %
HEMATOCRIT: 21.8 % — AB (ref 39.0–52.0)
HEMOGLOBIN: 7.3 g/dL — AB (ref 13.0–17.0)
LYMPHS PCT: 10 %
Lymphs Abs: 0.7 10*3/uL (ref 0.7–4.0)
MCH: 29.2 pg (ref 26.0–34.0)
MCHC: 33.5 g/dL (ref 30.0–36.0)
MCV: 87.2 fL (ref 78.0–100.0)
MONO ABS: 0.4 10*3/uL (ref 0.1–1.0)
MONOS PCT: 5 %
NEUTROS ABS: 5.9 10*3/uL (ref 1.7–7.7)
Neutrophils Relative %: 84 %
Platelets: 142 10*3/uL — ABNORMAL LOW (ref 150–400)
RBC: 2.5 MIL/uL — ABNORMAL LOW (ref 4.22–5.81)
RDW: 15.1 % (ref 11.5–15.5)
WBC: 7.1 10*3/uL (ref 4.0–10.5)

## 2015-09-21 LAB — BASIC METABOLIC PANEL
ANION GAP: 17 — AB (ref 5–15)
BUN: 76 mg/dL — ABNORMAL HIGH (ref 6–20)
CALCIUM: 8.2 mg/dL — AB (ref 8.9–10.3)
CHLORIDE: 101 mmol/L (ref 101–111)
CO2: 19 mmol/L — AB (ref 22–32)
CREATININE: 13.26 mg/dL — AB (ref 0.61–1.24)
GFR calc Af Amer: 4 mL/min — ABNORMAL LOW (ref >60)
GFR calc non Af Amer: 4 mL/min — ABNORMAL LOW (ref >60)
GLUCOSE: 143 mg/dL — AB (ref 65–99)
Potassium: 4.9 mmol/L (ref 3.5–5.1)
Sodium: 137 mmol/L (ref 135–145)

## 2015-09-21 MED ORDER — PENTAFLUOROPROP-TETRAFLUOROETH EX AERO: 1.0000 "application " | INHALATION_SPRAY | CUTANEOUS | Status: DC | PRN

## 2015-09-21 MED ORDER — ONDANSETRON 4 MG PO TBDP: 4.0000 mg | ORAL_TABLET | Freq: Once | ORAL | Status: DC

## 2015-09-21 MED ORDER — SODIUM CHLORIDE 0.9 % IV SOLN: 100.0000 mL | INTRAVENOUS | Status: DC | PRN

## 2015-09-21 MED ORDER — ALPRAZOLAM 0.5 MG PO TABS
0.5000 mg | ORAL_TABLET | Freq: Once | ORAL | Status: AC
  Administered 2015-09-21: 0.5 mg via ORAL
  Filled 2015-09-21: qty 1

## 2015-09-21 MED ORDER — HYDROCODONE-ACETAMINOPHEN 5-325 MG PO TABS
2.0000 | ORAL_TABLET | Freq: Once | ORAL | Status: AC
  Administered 2015-09-21: 2 via ORAL
  Filled 2015-09-21: qty 2

## 2015-09-21 MED ORDER — ONDANSETRON HCL 4 MG/2ML IJ SOLN: 4.0000 mg | INTRAMUSCULAR | Status: DC

## 2015-09-21 MED ORDER — ONDANSETRON 4 MG PO TBDP
4.0000 mg | ORAL_TABLET | Freq: Once | ORAL | Status: AC
  Administered 2015-09-21: 4 mg via ORAL
  Filled 2015-09-21: qty 1

## 2015-09-21 MED ORDER — HYDROCODONE-ACETAMINOPHEN 5-325 MG PO TABS
1.0000 | ORAL_TABLET | Freq: Four times a day (QID) | ORAL | Status: DC | PRN
  Administered 2015-09-21 – 2015-09-22 (×2): 1 via ORAL
  Filled 2015-09-21: qty 1

## 2015-09-21 MED ORDER — DARBEPOETIN ALFA 200 MCG/0.4ML IJ SOSY
200.0000 ug | PREFILLED_SYRINGE | INTRAMUSCULAR | Status: DC
  Administered 2015-09-22: 200 ug via INTRAVENOUS

## 2015-09-21 MED ORDER — LIDOCAINE-PRILOCAINE 2.5-2.5 % EX CREA: 1.0000 "application " | TOPICAL_CREAM | CUTANEOUS | Status: DC | PRN

## 2015-09-21 MED ORDER — ALTEPLASE 2 MG IJ SOLR: 2.0000 mg | Freq: Once | INTRAMUSCULAR | Status: DC | PRN

## 2015-09-21 MED ORDER — LIDOCAINE HCL (PF) 1 % IJ SOLN: 5.0000 mL | INTRAMUSCULAR | Status: DC | PRN

## 2015-09-21 NOTE — ED Notes (Signed)
Pt complained that dietician removed his chicken pie from his dinner and substituted grill chicken breasts.  He stated that, "She can't do that."  This EMT replied that when the MD orders a renal diet, that the dietician can do whatever follows the doctor's orders.  Pt stated that he would not be eating any of the chicken breast, that he wanted the chix pie instead.  This EMT apologized for the substitution, but shared that we had to all follow doctor's orders.

## 2015-09-21 NOTE — ED Provider Notes (Signed)
CSN: [id number]     Arrival date & time 09/21/15  0636 History   First MD Initiated Contact with Patient 09/21/15 [phone]     Chief Complaint  Patient presents with  . Dialysis   . Shortness of Breath    HPI   Frank [name] is a 50 y.o. male with a PMH of HTN, DM, CHF, ESRD on HD who presents to the ED for dialysis. He notes shortness of breath, which he states is characteristic of the symptoms he experiences prior to needing dialysis and is unchanged. He reports his last session was Monday, and he is supposed to receive dialysis Monday, Tuesday, Thursday, and Saturday. He notes his symptoms are constant. He denies exacerbating or alleviating factors. He also reports back pain and nausea. He denies fever, chills, chest pain, abdominal pain, vomiting, diarrhea, constipation, bowel or bladder incontinence, numbness, weakness, paresthesia, recent injury.   Past Medical History  Diagnosis Date  . Hypertension   . Depression   . Complication of anesthesia     itching, sore throat  . Diabetes mellitus without complication ([place])     No history per patient, but remains under history as A1c would not be accurate given on dialysis  . Shortness of breath   . Anxiety   . ESRD (end stage renal disease) ([place])     due to HTN per patient, followed at [facility], s/p failed kidney transplant - dialysis Tue, Th, Sat  . Renal insufficiency   . CHF (congestive heart failure) ([place])   . Anemia   . Dialysis patient [facility])    Past Surgical History  Procedure Laterality Date  . Kidney receipient  2006    failed and started HD in March 2014  . Capd insertion    . Capd removal    . Left heart catheterization with coronary angiogram N/A 09/02/2014    Procedure: LEFT HEART CATHETERIZATION WITH CORONARY ANGIOGRAM;  Surgeon: [name], MD;  Location: [facility] CATH LAB;  Service: Cardiovascular;  Laterality: N/A;  . Inguinal hernia repair Right 02/14/2015    Procedure: REPAIR INCARCERATED RIGHT INGUINAL HERNIA;   Surgeon: [name], MD;  Location: [place];  Service: General;  Laterality: Right;   Family History  Problem Relation Age of Onset  . Hypertension Other    Social History  Substance Use Topics  . Smoking status: Former Smoker -- 0.00 packs/day for 1 years    Types: Cigarettes  . Smokeless tobacco: Never Used     Comment: quit Jan 2014  . Alcohol Use: No      Review of Systems  Constitutional: Negative for fever and chills.  Respiratory: Positive for shortness of breath.   Cardiovascular: Negative for chest pain.  Gastrointestinal: Positive for nausea. Negative for vomiting, abdominal pain, diarrhea and constipation.  Musculoskeletal: Positive for back pain.  All other systems reviewed and are negative.     Allergies  Butalbital-apap-caffeine; Ferrlecit; Minoxidil; Darvocet; and Propoxyphene  Home Medications   Prior to Admission medications   Medication Sig Start Date End Date Taking? Authorizing Provider  ALPRAZolam [name]) 0.5 MG tablet Take 1 tablet (0.5 mg total) by mouth 2 (two) times daily as needed for anxiety. 09/11/15  Yes [name], MD  amLODipine (NORVASC) 10 MG tablet Take 10 mg by mouth daily.   Yes Historical Provider, MD  atorvastatin (LIPITOR) 40 MG tablet Take 1 tablet (40 mg total) by mouth daily at 6 PM. 09/02/14  Yes Barton Dubois, MD  carvedilol (COREG) 25  MG tablet Take 1 tablet (25 mg total) by mouth 2 (two) times daily with a meal. 10/10/14  Yes [name], DO  cinacalcet (SENSIPAR) 30 MG tablet Take 30 mg by mouth daily.   Yes Historical Provider, MD  docusate sodium (COLACE) 100 MG capsule Take 1 capsule (100 mg total) by mouth 2 (two) times daily. 11/19/14  Yes [name], MD  doxercalciferol (HECTOROL) 4 MCG/2ML injection Inject 1.25 mLs (2.5 mcg total) into the vein Every Tuesday,Thursday,and Saturday with dialysis. 10/10/14  Yes [name], DO  gabapentin (NEURONTIN) 100 MG capsule Take 100 mg by mouth 3 (three) times daily.  06/14/15 09/21/15 Yes Historical Provider, MD  hydrALAZINE (APRESOLINE) 50 MG tablet Take 2 tablets (100 mg total) by mouth every 8 (eight) hours. 09/11/15  Yes [name], MD  hydrocerin (EUCERIN) CREA Apply 1 application topically 2 (two) times daily. 02/04/15  Yes [name], MD  HYDROcodone-acetaminophen (NORCO/VICODIN) 5-325 MG tablet Take 1 tablet by mouth every 6 (six) hours as needed. Patient taking differently: Take 1 tablet by mouth every 6 (six) hours as needed for moderate pain.  09/11/15  Yes [name], MD  isosorbide mononitrate (IMDUR) 30 MG 24 hr tablet Take 3 tablets (90 mg total) by mouth daily. 09/11/15  Yes [name], MD  levalbuterol [facility] HFA) 45 MCG/ACT inhaler Inhale 2 puffs into the lungs every 6 (six) hours as needed for wheezing or shortness of breath. 11/19/14  Yes [name], MD  lisinopril (PRINIVIL,ZESTRIL) 20 MG tablet Take 1 tablet (20 mg total) by mouth daily. 02/04/15  Yes [name], MD  multivitamin (RENA-VIT) TABS tablet Take 1 tablet by mouth at bedtime. 02/04/15  Yes [name], MD  omeprazole (PRILOSEC) 20 MG capsule Take 20 mg by mouth daily. 07/01/13  Yes Historical Provider, MD  predniSONE (DELTASONE) 5 MG tablet Take 5 mg by mouth daily with breakfast.   Yes Historical Provider, MD  sevelamer carbonate (RENVELA) 800 MG tablet Take 800-1,600 mg by mouth 3 (three) times daily with meals. 2 tabs three times daily with meals, and 1 tablet with snacks   Yes Historical Provider, MD  warfarin (COUMADIN) 6 MG tablet Take 1.5 tablets (9 mg total) by mouth daily at 6 PM. Take daily until adjusted per MD 09/12/15  Yes [name], MD    BP 168/126 mmHg  Pulse 82  Temp(Src) 97.5 F (36.4 C) (Oral)  Resp 18  Ht _0  (1.88 m)  Wt 80.06 kg  BMI 22.65 kg/m2  SpO2 100% Physical Exam  Constitutional: He is oriented to person, place, and time. He appears well-developed and well-nourished. No distress.  HENT:  Head: Normocephalic and  atraumatic.  Right Ear: External ear normal.  Left Ear: External ear normal.  Nose: Nose normal.  Mouth/Throat: Uvula is midline, oropharynx is clear and moist and mucous membranes are normal.  Eyes: Conjunctivae, EOM and lids are normal. Pupils are equal, round, and reactive to light. Right eye exhibits no discharge. Left eye exhibits no discharge. No scleral icterus.  Neck: Normal range of motion. Neck supple.  Cardiovascular: Normal rate, regular rhythm, normal heart sounds, intact distal pulses and normal pulses.   Pulmonary/Chest: Effort normal and breath sounds normal. No respiratory distress. He has no wheezes. He has no rales.  Abdominal: Soft. Normal appearance and bowel sounds are normal. He exhibits no distension and no mass. There is no tenderness. There is no rigidity, no rebound and  no guarding.  Musculoskeletal: Normal range of motion. He exhibits tenderness. He exhibits no edema.  Mild TTP over thoracic paraspinal muscles. No midline tenderness, step-off, or deformity.  Neurological: He is alert and oriented to person, place, and time. He has normal strength. No sensory deficit.  Skin: Skin is warm, dry and intact. No rash noted. He is not diaphoretic. No erythema. No pallor.  Psychiatric: He has a normal mood and affect. His speech is normal and behavior is normal.  Nursing note and vitals reviewed.   ED Course  Procedures (including critical care time)  Labs Review Labs Reviewed  CBC WITH DIFFERENTIAL/PLATELET - Abnormal; Notable for the following:    RBC 2.50 (*)    Hemoglobin 7.3 (*)    HCT 21.8 (*)    Platelets 142 (*)    All other components within normal limits  BASIC METABOLIC PANEL - Abnormal; Notable for the following:    CO2 19 (*)    Glucose, Bld 143 (*)    BUN 76 (*)    Creatinine, Ser 13.26 (*)    Calcium 8.2 (*)    GFR calc non Af Amer 4 (*)    GFR calc Af Amer 4 (*)    Anion gap 17 (*)    All other components within normal limits    Imaging  Review Dg Chest 2 View  09/21/2015  CLINICAL DATA:  Shortness of breath. EXAM: CHEST  2 VIEW COMPARISON:  September 19, 2015. FINDINGS: Stable cardiomegaly. Right internal jugular dialysis catheter is unchanged with distal tip in expected position of right atrium. No pneumothorax or pleural effusion is noted. No acute pulmonary disease is noted. Mild central pulmonary vascular congestion is noted. Bony thorax is unremarkable. IMPRESSION: Stable cardiomegaly with central pulmonary vascular congestion. Electronically Signed   By: Marijo Conception, M.D.   On: 09/21/2015 07:21   Dg Chest 2 View  09/19/2015  CLINICAL DATA:  Acute onset of shortness of breath, nausea and vomiting. Mid chest pain and cough. Initial encounter. EXAM: CHEST  2 VIEW COMPARISON:  Chest radiograph from 09/16/2015 FINDINGS: Vascular congestion is noted. Mildly increased interstitial markings raise concern for mild interstitial edema. No pleural effusion or pneumothorax is seen. The cardiomediastinal silhouette is enlarged. No acute osseous abnormalities are identified. A right-sided dual-lumen catheter is noted ending within the right atrium. IMPRESSION: Vascular congestion and cardiomegaly. Mildly increased interstitial markings raise concern for mild interstitial edema. Electronically Signed   By: Garald Balding M.D.   On: 09/19/2015 22:58   I have personally reviewed and evaluated these images and lab results as part of my medical decision-making.   EKG Interpretation   Date/Time:  Thursday September 21 2015 07:42:42 EST Ventricular Rate:  81 PR Interval:  194 QRS Duration: 95 QT Interval:  407 QTC Calculation: 472 R Axis:   3 Text Interpretation:  Sinus rhythm Probable left atrial enlargement LVH  with secondary repolarization abnormality No significant change since last  tracing Confirmed by YAO  MD, DAVID ([id number]) on 09/21/2015 8:12:49 AM      MDM   Final diagnoses:  Encounter for dialysis [facility])  Shortness of breath   Back pain, unspecified location    50 year old male presents requesting dialysis. Notes shortness of breath, which he reports is characteristic of the symptoms he experiences when he needs dialysis and is unchanged. Also reports back pain. Denies fever, chills, chest pain. Patient is afebrile. Hypertensive. Heart regular rate and rhythm. Lungs clear to auscultation bilaterally. Abdomen soft, nontender,  nondistended. Mild tenderness palpation of thoracic paraspinal muscles. No significant midline tenderness, step-off, or deformity. Strength and sensation intact. Patient ambulates without difficulty. Labs and imaging pending.  EKG no significant change since last tracing, sinus rhythm, HR 81. CBC remarkable for hemoglobin 7.3, which appears stable from baseline. BMP remarkable for potassium 4.9, creatinine 13.26. Chest x-ray remarkable for vascular congestion and cardiomegaly, mildly increased interstitial markings.  Dr. Darl Householder spoke with nephrology, who advised no emergent need for dialysis, however patient on dialysis schedule for this afternoon. Patient requesting breakfast and pain medicine for back pain. States he takes vicodin at home.  2 PM - Spoke with dialysis, patient on schedule for later today, however dialysis is currently busy and they do not have an estimate for when he will go to receive treatment.   BP 168/126 mmHg  Pulse 82  Temp(Src) 97.5 F (36.4 C) (Oral)  Resp 18  Ht _0  (1.88 m)  Wt 80.06 kg  BMI 22.65 kg/m2  SpO2 100%     Marella Chimes, PA-C 09/21/15 1515  Veryl Speak, MD 09/22/15 1707

## 2015-09-21 NOTE — ED Notes (Signed)
Ordered diet tray

## 2015-09-21 NOTE — Care Management (Signed)
ED CM met with patient at bedside concerning status of OP HD. Patient states, he has not been accepted to a HD center as of yet still awaits transfer to local HD center. Patient states, he is having transportation issues unable to get to HD in [place]. Patient advised to contact HD center's SW to discuss issues with transfer and transportation. Patient verbalized understanding. Patient awaiting HD in the ED. Patient informed about the [employer] Kid[employer] Patient  https://owens.org/ for patient support. Provided patient with organization contact. Teach back done, patient verbalized understanding. No further ED CM needs identified.

## 2015-09-21 NOTE — Discharge Instructions (Signed)
° °

## 2015-09-21 NOTE — ED Notes (Signed)
Pt c/o chronic pain -- dr Zenia Resides notified, orders received.

## 2015-09-21 NOTE — ED Notes (Signed)
Patient presents being SOB and needs dialysis.  Unable to make it to [facility] for his dialysis

## 2015-09-21 NOTE — ED Notes (Signed)
Pt requesting O2-- states is on home o2

## 2015-09-22 MED ORDER — HYDROCODONE-ACETAMINOPHEN 5-325 MG PO TABS
ORAL_TABLET | ORAL | Status: AC
  Filled 2015-09-22: qty 1

## 2015-09-22 MED ORDER — CLONIDINE HCL 0.1 MG PO TABS
0.1000 mg | ORAL_TABLET | Freq: Once | ORAL | Status: AC
  Administered 2015-09-22: 0.1 mg via ORAL
  Filled 2015-09-22: qty 1

## 2015-09-22 MED ORDER — DARBEPOETIN ALFA 200 MCG/0.4ML IJ SOSY
PREFILLED_SYRINGE | INTRAMUSCULAR | Status: AC
  Filled 2015-09-22: qty 0.4

## 2015-09-22 NOTE — Progress Notes (Signed)
Patient discharge home after dialysis.  Dr deterding notified of B/p 183/112. Patient has had elevated b/p prior to dialysis. Medicated with clonidine 0.53m as ordered. Patient denies discomfort. No c/o voiced. Alert and oriented x 4.

## 2015-09-23 ENCOUNTER — Emergency Department (HOSPITAL_COMMUNITY): Payer: Medicare Other

## 2015-09-23 ENCOUNTER — Emergency Department (HOSPITAL_COMMUNITY): Payer: Medicare Other | Admitting: Anesthesiology

## 2015-09-23 ENCOUNTER — Inpatient Hospital Stay (HOSPITAL_COMMUNITY)
Admission: EM | Admit: 2015-09-23 | Discharge: 2015-09-27 | DRG: 291 | Disposition: A | Discharge status: Home / Self Care | Payer: Medicare Other | Attending: Internal Medicine | Admitting: Internal Medicine

## 2015-09-23 ENCOUNTER — Encounter (HOSPITAL_COMMUNITY): Payer: Self-pay | Admitting: Emergency Medicine

## 2015-09-23 ENCOUNTER — Encounter (HOSPITAL_COMMUNITY)
Admission: EM | Disposition: A | Discharge status: Home / Self Care | Payer: Self-pay | Source: Home / Self Care | Attending: Internal Medicine

## 2015-09-23 DIAGNOSIS — Z79899 Other long term (current) drug therapy: Secondary | ICD-10-CM

## 2015-09-23 DIAGNOSIS — I132 Hypertensive heart and chronic kidney disease with heart failure and with stage 5 chronic kidney disease, or end stage renal disease: Secondary | ICD-10-CM | POA: Diagnosis not present

## 2015-09-23 DIAGNOSIS — I5043 Acute on chronic combined systolic (congestive) and diastolic (congestive) heart failure: Secondary | ICD-10-CM | POA: Diagnosis present

## 2015-09-23 DIAGNOSIS — Z87891 Personal history of nicotine dependence: Secondary | ICD-10-CM

## 2015-09-23 DIAGNOSIS — R0602 Shortness of breath: Secondary | ICD-10-CM | POA: Diagnosis not present

## 2015-09-23 DIAGNOSIS — I2782 Chronic pulmonary embolism: Secondary | ICD-10-CM | POA: Diagnosis present

## 2015-09-23 DIAGNOSIS — T8612 Kidney transplant failure: Secondary | ICD-10-CM | POA: Diagnosis present

## 2015-09-23 DIAGNOSIS — Z888 Allergy status to other drugs, medicaments and biological substances status: Secondary | ICD-10-CM

## 2015-09-23 DIAGNOSIS — T82898A Other specified complication of vascular prosthetic devices, implants and grafts, initial encounter: Secondary | ICD-10-CM | POA: Diagnosis not present

## 2015-09-23 DIAGNOSIS — N186 End stage renal disease: Secondary | ICD-10-CM | POA: Diagnosis not present

## 2015-09-23 DIAGNOSIS — I120 Hypertensive chronic kidney disease with stage 5 chronic kidney disease or end stage renal disease: Secondary | ICD-10-CM | POA: Diagnosis not present

## 2015-09-23 DIAGNOSIS — Z452 Encounter for adjustment and management of vascular access device: Secondary | ICD-10-CM | POA: Diagnosis not present

## 2015-09-23 DIAGNOSIS — I480 Paroxysmal atrial fibrillation: Secondary | ICD-10-CM | POA: Diagnosis present

## 2015-09-23 DIAGNOSIS — I5023 Acute on chronic systolic (congestive) heart failure: Secondary | ICD-10-CM | POA: Diagnosis present

## 2015-09-23 DIAGNOSIS — Z7901 Long term (current) use of anticoagulants: Secondary | ICD-10-CM

## 2015-09-23 DIAGNOSIS — R0789 Other chest pain: Secondary | ICD-10-CM

## 2015-09-23 DIAGNOSIS — D638 Anemia in other chronic diseases classified elsewhere: Secondary | ICD-10-CM | POA: Diagnosis present

## 2015-09-23 DIAGNOSIS — Z8249 Family history of ischemic heart disease and other diseases of the circulatory system: Secondary | ICD-10-CM

## 2015-09-23 DIAGNOSIS — Z885 Allergy status to narcotic agent status: Secondary | ICD-10-CM

## 2015-09-23 DIAGNOSIS — Z86718 Personal history of other venous thrombosis and embolism: Secondary | ICD-10-CM

## 2015-09-23 DIAGNOSIS — T8241XA Breakdown (mechanical) of vascular dialysis catheter, initial encounter: Secondary | ICD-10-CM | POA: Diagnosis not present

## 2015-09-23 DIAGNOSIS — I5022 Chronic systolic (congestive) heart failure: Secondary | ICD-10-CM

## 2015-09-23 DIAGNOSIS — I160 Hypertensive urgency: Secondary | ICD-10-CM | POA: Diagnosis present

## 2015-09-23 DIAGNOSIS — F329 Major depressive disorder, single episode, unspecified: Secondary | ICD-10-CM | POA: Diagnosis present

## 2015-09-23 DIAGNOSIS — Z992 Dependence on renal dialysis: Secondary | ICD-10-CM

## 2015-09-23 DIAGNOSIS — R079 Chest pain, unspecified: Secondary | ICD-10-CM | POA: Diagnosis present

## 2015-09-23 DIAGNOSIS — Z9119 Patient's noncompliance with other medical treatment and regimen: Secondary | ICD-10-CM

## 2015-09-23 DIAGNOSIS — T8243XA Leakage of vascular dialysis catheter, initial encounter: Secondary | ICD-10-CM | POA: Diagnosis not present

## 2015-09-23 DIAGNOSIS — E1122 Type 2 diabetes mellitus with diabetic chronic kidney disease: Secondary | ICD-10-CM | POA: Diagnosis present

## 2015-09-23 DIAGNOSIS — Z9114 Patient's other noncompliance with medication regimen: Secondary | ICD-10-CM

## 2015-09-23 DIAGNOSIS — E785 Hyperlipidemia, unspecified: Secondary | ICD-10-CM | POA: Diagnosis present

## 2015-09-23 DIAGNOSIS — I509 Heart failure, unspecified: Secondary | ICD-10-CM | POA: Diagnosis not present

## 2015-09-23 DIAGNOSIS — Z7952 Long term (current) use of systemic steroids: Secondary | ICD-10-CM

## 2015-09-23 DIAGNOSIS — Z419 Encounter for procedure for purposes other than remedying health state, unspecified: Secondary | ICD-10-CM

## 2015-09-23 HISTORY — PX: INSERTION OF DIALYSIS CATHETER: SHX1324

## 2015-09-23 HISTORY — DX: Acute on chronic systolic (congestive) heart failure: I50.23

## 2015-09-23 HISTORY — DX: Chronic systolic (congestive) heart failure: I50.22

## 2015-09-23 LAB — CBC
HEMATOCRIT: 23.8 % — AB (ref 39.0–52.0)
HEMOGLOBIN: 7.4 g/dL — AB (ref 13.0–17.0)
MCH: 27.5 pg (ref 26.0–34.0)
MCHC: 31.1 g/dL (ref 30.0–36.0)
MCV: 88.5 fL (ref 78.0–100.0)
Platelets: 173 10*3/uL (ref 150–400)
RBC: 2.69 MIL/uL — ABNORMAL LOW (ref 4.22–5.81)
RDW: 15.2 % (ref 11.5–15.5)
WBC: 6.3 10*3/uL (ref 4.0–10.5)

## 2015-09-23 LAB — RENAL FUNCTION PANEL
ALBUMIN: 2.8 g/dL — AB (ref 3.5–5.0)
ANION GAP: 13 (ref 5–15)
BUN: 55 mg/dL — ABNORMAL HIGH (ref 6–20)
CHLORIDE: 99 mmol/L — AB (ref 101–111)
CO2: 28 mmol/L (ref 22–32)
Calcium: 8.6 mg/dL — ABNORMAL LOW (ref 8.9–10.3)
Creatinine, Ser: 9.55 mg/dL — ABNORMAL HIGH (ref 0.61–1.24)
GFR calc Af Amer: 6 mL/min — ABNORMAL LOW (ref >60)
GFR, EST NON AFRICAN AMERICAN: 6 mL/min — AB (ref >60)
GLUCOSE: 138 mg/dL — AB (ref 65–99)
PHOSPHORUS: 4.2 mg/dL (ref 2.5–4.6)
POTASSIUM: 5.1 mmol/L (ref 3.5–5.1)
Sodium: 140 mmol/L (ref 135–145)

## 2015-09-23 LAB — APTT: APTT: 26 s (ref 24–37)

## 2015-09-23 LAB — PROTIME-INR
INR: 1.32 (ref 0.00–1.49)
Prothrombin Time: 16.5 seconds — ABNORMAL HIGH (ref 11.6–15.2)

## 2015-09-23 LAB — HEPATITIS B SURFACE ANTIGEN: Hepatitis B Surface Ag: NEGATIVE

## 2015-09-23 SURGERY — INSERTION OF DIALYSIS CATHETER
Anesthesia: Monitor Anesthesia Care | Site: Neck | Laterality: Right

## 2015-09-23 MED ORDER — LABETALOL HCL 5 MG/ML IV SOLN
INTRAVENOUS | Status: AC
  Filled 2015-09-23: qty 4

## 2015-09-23 MED ORDER — WARFARIN SODIUM 10 MG PO TABS: 10.0000 mg | ORAL_TABLET | Freq: Every day | ORAL | Status: DC

## 2015-09-23 MED ORDER — DOXERCALCIFEROL 4 MCG/2ML IV SOLN: 2.5000 ug | INTRAVENOUS | Status: DC

## 2015-09-23 MED ORDER — FENTANYL CITRATE (PF) 250 MCG/5ML IJ SOLN
INTRAMUSCULAR | Status: AC
  Filled 2015-09-23: qty 5

## 2015-09-23 MED ORDER — MIDAZOLAM HCL 2 MG/2ML IJ SOLN
INTRAMUSCULAR | Status: AC
  Filled 2015-09-23: qty 2

## 2015-09-23 MED ORDER — FENTANYL CITRATE (PF) 100 MCG/2ML IJ SOLN
25.0000 ug | INTRAMUSCULAR | Status: DC | PRN
  Administered 2015-09-23 (×2): 50 ug via INTRAVENOUS

## 2015-09-23 MED ORDER — VANCOMYCIN HCL IN DEXTROSE 1-5 GM/200ML-% IV SOLN
INTRAVENOUS | Status: AC
  Administered 2015-09-23: 1000 mg via INTRAVENOUS
  Filled 2015-09-23: qty 200

## 2015-09-23 MED ORDER — HYDROCODONE-ACETAMINOPHEN 5-325 MG PO TABS
1.0000 | ORAL_TABLET | Freq: Four times a day (QID) | ORAL | Status: DC | PRN
  Filled 2015-09-23: qty 1

## 2015-09-23 MED ORDER — CINACALCET HCL 30 MG PO TABS
30.0000 mg | ORAL_TABLET | Freq: Every day | ORAL | Status: DC
  Administered 2015-09-24 – 2015-09-27 (×4): 30 mg via ORAL
  Filled 2015-09-23 (×4): qty 1

## 2015-09-23 MED ORDER — HEPARIN SODIUM (PORCINE) 1000 UNIT/ML DIALYSIS
1500.0000 [IU] | INTRAMUSCULAR | Status: DC | PRN
  Filled 2015-09-23: qty 2

## 2015-09-23 MED ORDER — PREDNISONE 5 MG PO TABS: 5.0000 mg | ORAL_TABLET | Freq: Every day | ORAL | Status: DC

## 2015-09-23 MED ORDER — PROPOFOL 10 MG/ML IV BOLUS
INTRAVENOUS | Status: DC | PRN
  Administered 2015-09-23: 40 mg via INTRAVENOUS
  Administered 2015-09-23: 120 mg via INTRAVENOUS

## 2015-09-23 MED ORDER — SODIUM CHLORIDE 0.9% FLUSH
3.0000 mL | Freq: Two times a day (BID) | INTRAVENOUS | Status: DC
  Administered 2015-09-23 – 2015-09-27 (×7): 3 mL via INTRAVENOUS

## 2015-09-23 MED ORDER — HEPARIN SODIUM (PORCINE) 5000 UNIT/ML IJ SOLN
5000.0000 [IU] | Freq: Three times a day (TID) | INTRAMUSCULAR | Status: DC
Start: 2015-09-23 — End: 2015-09-23

## 2015-09-23 MED ORDER — LIDOCAINE-EPINEPHRINE (PF) 1 %-1:200000 IJ SOLN
INTRAMUSCULAR | Status: AC
  Filled 2015-09-23: qty 30

## 2015-09-23 MED ORDER — LABETALOL HCL 5 MG/ML IV SOLN
INTRAVENOUS | Status: DC | PRN
  Administered 2015-09-23: 10 mg via INTRAVENOUS

## 2015-09-23 MED ORDER — SODIUM CHLORIDE 0.9% FLUSH: 3.0000 mL | INTRAVENOUS | Status: DC | PRN

## 2015-09-23 MED ORDER — ALPRAZOLAM 0.5 MG PO TABS
0.5000 mg | ORAL_TABLET | Freq: Two times a day (BID) | ORAL | Status: DC | PRN
  Administered 2015-09-23 – 2015-09-27 (×3): 0.5 mg via ORAL
  Filled 2015-09-23 (×3): qty 1

## 2015-09-23 MED ORDER — ONDANSETRON HCL 4 MG/2ML IJ SOLN
4.0000 mg | Freq: Four times a day (QID) | INTRAMUSCULAR | Status: DC | PRN
  Administered 2015-09-24 – 2015-09-26 (×6): 4 mg via INTRAVENOUS
  Filled 2015-09-23 (×6): qty 2

## 2015-09-23 MED ORDER — SEVELAMER CARBONATE 800 MG PO TABS: 800.0000 mg | ORAL_TABLET | Freq: Two times a day (BID) | ORAL | Status: DC | PRN

## 2015-09-23 MED ORDER — ONDANSETRON HCL 4 MG PO TABS: 4.0000 mg | ORAL_TABLET | Freq: Four times a day (QID) | ORAL | Status: DC | PRN

## 2015-09-23 MED ORDER — PANTOPRAZOLE SODIUM 40 MG PO TBEC
40.0000 mg | DELAYED_RELEASE_TABLET | Freq: Every day | ORAL | Status: DC
  Administered 2015-09-24 – 2015-09-27 (×4): 40 mg via ORAL
  Filled 2015-09-23 (×4): qty 1

## 2015-09-23 MED ORDER — ONDANSETRON HCL 4 MG/2ML IJ SOLN
INTRAMUSCULAR | Status: DC | PRN
  Administered 2015-09-23: 4 mg via INTRAVENOUS

## 2015-09-23 MED ORDER — ATORVASTATIN CALCIUM 40 MG PO TABS
40.0000 mg | ORAL_TABLET | Freq: Every day | ORAL | Status: DC
  Administered 2015-09-25: 40 mg via ORAL
  Filled 2015-09-23 (×2): qty 1

## 2015-09-23 MED ORDER — HEPARIN SODIUM (PORCINE) 1000 UNIT/ML IJ SOLN
INTRAMUSCULAR | Status: AC
  Filled 2015-09-23: qty 1

## 2015-09-23 MED ORDER — SUCCINYLCHOLINE CHLORIDE 20 MG/ML IJ SOLN
INTRAMUSCULAR | Status: DC | PRN
  Administered 2015-09-23: 100 mg via INTRAVENOUS

## 2015-09-23 MED ORDER — SEVELAMER CARBONATE 800 MG PO TABS: 1600.0000 mg | ORAL_TABLET | Freq: Three times a day (TID) | ORAL | Status: DC

## 2015-09-23 MED ORDER — AMLODIPINE BESYLATE 10 MG PO TABS
10.0000 mg | ORAL_TABLET | Freq: Every day | ORAL | Status: DC
  Administered 2015-09-24 – 2015-09-25 (×2): 10 mg via ORAL
  Filled 2015-09-23 (×2): qty 1

## 2015-09-23 MED ORDER — ONDANSETRON 4 MG PO TBDP
4.0000 mg | ORAL_TABLET | Freq: Once | ORAL | Status: AC
  Administered 2015-09-23: 4 mg via ORAL
  Filled 2015-09-23: qty 1

## 2015-09-23 MED ORDER — LISINOPRIL 20 MG PO TABS
20.0000 mg | ORAL_TABLET | Freq: Every day | ORAL | Status: DC
  Administered 2015-09-24 – 2015-09-27 (×4): 20 mg via ORAL
  Filled 2015-09-23 (×4): qty 1

## 2015-09-23 MED ORDER — SODIUM CHLORIDE 0.9 % IV SOLN: 100.0000 mL | INTRAVENOUS | Status: DC | PRN

## 2015-09-23 MED ORDER — ISOSORBIDE MONONITRATE ER 60 MG PO TB24
90.0000 mg | ORAL_TABLET | Freq: Every day | ORAL | Status: DC
  Administered 2015-09-24 – 2015-09-27 (×4): 90 mg via ORAL
  Filled 2015-09-23 (×5): qty 1

## 2015-09-23 MED ORDER — WARFARIN - PHARMACIST DOSING INPATIENT
Freq: Every day | Status: DC
  Administered 2015-09-24 – 2015-09-25 (×2)

## 2015-09-23 MED ORDER — FENTANYL CITRATE (PF) 100 MCG/2ML IJ SOLN
INTRAMUSCULAR | Status: AC
  Filled 2015-09-23: qty 2

## 2015-09-23 MED ORDER — HYDRALAZINE HCL 50 MG PO TABS
100.0000 mg | ORAL_TABLET | Freq: Three times a day (TID) | ORAL | Status: DC
  Administered 2015-09-23 – 2015-09-27 (×10): 100 mg via ORAL
  Filled 2015-09-23 (×10): qty 2

## 2015-09-23 MED ORDER — LEVALBUTEROL HCL 0.63 MG/3ML IN NEBU: 0.6300 mg | INHALATION_SOLUTION | Freq: Four times a day (QID) | RESPIRATORY_TRACT | Status: DC | PRN

## 2015-09-23 MED ORDER — SODIUM CHLORIDE 0.9 % IV SOLN
INTRAVENOUS | Status: DC | PRN
  Administered 2015-09-23: 500 mL

## 2015-09-23 MED ORDER — PROPOFOL 10 MG/ML IV BOLUS
INTRAVENOUS | Status: AC
Start: 2015-09-23 — End: 2015-09-23
  Filled 2015-09-23: qty 20

## 2015-09-23 MED ORDER — PENTAFLUOROPROP-TETRAFLUOROETH EX AERO
1.0000 "application " | INHALATION_SPRAY | CUTANEOUS | Status: DC | PRN
  Filled 2015-09-23: qty 30

## 2015-09-23 MED ORDER — RENA-VITE PO TABS
1.0000 | ORAL_TABLET | Freq: Every day | ORAL | Status: DC
  Administered 2015-09-25 – 2015-09-27 (×2): 1 via ORAL
  Filled 2015-09-23 (×3): qty 1

## 2015-09-23 MED ORDER — ESMOLOL HCL 100 MG/10ML IV SOLN
INTRAVENOUS | Status: AC
  Filled 2015-09-23: qty 10

## 2015-09-23 MED ORDER — LIDOCAINE-PRILOCAINE 2.5-2.5 % EX CREA
1.0000 "application " | TOPICAL_CREAM | CUTANEOUS | Status: DC | PRN
  Filled 2015-09-23: qty 5

## 2015-09-23 MED ORDER — SODIUM CHLORIDE 0.9 % IR SOLN
Status: DC | PRN
  Administered 2015-09-23: 1000 mL

## 2015-09-23 MED ORDER — SODIUM CHLORIDE 0.9 % IV SOLN
250.0000 mL | INTRAVENOUS | Status: DC | PRN
Start: 2015-09-23 — End: 2015-09-27

## 2015-09-23 MED ORDER — SUCCINYLCHOLINE CHLORIDE 20 MG/ML IJ SOLN
INTRAMUSCULAR | Status: AC
  Filled 2015-09-23: qty 1

## 2015-09-23 MED ORDER — FENTANYL CITRATE (PF) 250 MCG/5ML IJ SOLN
INTRAMUSCULAR | Status: DC | PRN
  Administered 2015-09-23: 50 ug via INTRAVENOUS
  Administered 2015-09-23 (×2): 100 ug via INTRAVENOUS

## 2015-09-23 MED ORDER — WARFARIN SODIUM 2.5 MG PO TABS
12.5000 mg | ORAL_TABLET | ORAL | Status: AC
  Administered 2015-09-23: 12.5 mg via ORAL
  Filled 2015-09-23 (×2): qty 1

## 2015-09-23 MED ORDER — HEPARIN SODIUM (PORCINE) 1000 UNIT/ML IJ SOLN
INTRAMUSCULAR | Status: DC | PRN
  Administered 2015-09-23: 3.4 mL via INTRAVENOUS

## 2015-09-23 MED ORDER — IPRATROPIUM-ALBUTEROL 0.5-2.5 (3) MG/3ML IN SOLN: 3.0000 mL | RESPIRATORY_TRACT | Status: DC | PRN

## 2015-09-23 MED ORDER — SODIUM CHLORIDE 0.9 % IV SOLN
INTRAVENOUS | Status: DC | PRN
Start: 2015-09-23 — End: 2015-09-23
  Administered 2015-09-23: 16:00:00 via INTRAVENOUS

## 2015-09-23 MED ORDER — DOCUSATE SODIUM 100 MG PO CAPS
100.0000 mg | ORAL_CAPSULE | Freq: Two times a day (BID) | ORAL | Status: DC
  Administered 2015-09-23 – 2015-09-27 (×6): 100 mg via ORAL
  Filled 2015-09-23 (×7): qty 1

## 2015-09-23 MED ORDER — LIDOCAINE HCL (CARDIAC) 20 MG/ML IV SOLN
INTRAVENOUS | Status: AC
  Filled 2015-09-23: qty 5

## 2015-09-23 MED ORDER — HYDROMORPHONE HCL 1 MG/ML IJ SOLN
0.5000 mg | INTRAMUSCULAR | Status: DC | PRN
  Administered 2015-09-23 – 2015-09-26 (×10): 0.5 mg via INTRAVENOUS
  Filled 2015-09-23 (×10): qty 1

## 2015-09-23 MED ORDER — MIDAZOLAM HCL 2 MG/2ML IJ SOLN
INTRAMUSCULAR | Status: DC | PRN
  Administered 2015-09-23: 2 mg via INTRAVENOUS

## 2015-09-23 MED ORDER — FENTANYL CITRATE (PF) 100 MCG/2ML IJ SOLN: INTRAMUSCULAR | Status: DC | PRN

## 2015-09-23 MED ORDER — HYDRALAZINE HCL 20 MG/ML IJ SOLN
10.0000 mg | INTRAMUSCULAR | Status: DC | PRN
  Administered 2015-09-24: 10 mg via INTRAVENOUS
  Filled 2015-09-23: qty 1

## 2015-09-23 MED ORDER — CARVEDILOL 25 MG PO TABS: 25.0000 mg | ORAL_TABLET | Freq: Two times a day (BID) | ORAL | Status: DC

## 2015-09-23 MED ORDER — LIDOCAINE HCL (PF) 1 % IJ SOLN: 5.0000 mL | INTRAMUSCULAR | Status: DC | PRN

## 2015-09-23 MED ORDER — GABAPENTIN 100 MG PO CAPS
100.0000 mg | ORAL_CAPSULE | Freq: Three times a day (TID) | ORAL | Status: DC
  Administered 2015-09-23 – 2015-09-27 (×10): 100 mg via ORAL
  Filled 2015-09-23 (×9): qty 1

## 2015-09-23 MED ORDER — PROMETHAZINE HCL 25 MG/ML IJ SOLN: 6.2500 mg | INTRAMUSCULAR | Status: DC | PRN

## 2015-09-23 SURGICAL SUPPLY — 42 items
BAG DECANTER FOR FLEXI CONT (MISCELLANEOUS) ×3 IMPLANT
BIOPATCH RED 1 DISK 7.0 (GAUZE/BANDAGES/DRESSINGS) ×2 IMPLANT
BIOPATCH RED 1IN DISK 7.0MM (GAUZE/BANDAGES/DRESSINGS) ×1
CATH PALINDROME REV 19CM (CATHETERS) ×2 IMPLANT
CATH PALINDROME RT-P 15FX19CM (CATHETERS) IMPLANT
CATH PALINDROME RT-P 15FX23CM (CATHETERS) IMPLANT
CATH PALINDROME RT-P 15FX28CM (CATHETERS) IMPLANT
CATH PALINDROME RT-P 15FX55CM (CATHETERS) IMPLANT
COVER PROBE W GEL 5X96 (DRAPES) ×3 IMPLANT
DRAPE C-ARM 42X72 X-RAY (DRAPES) ×3 IMPLANT
DRAPE CHEST BREAST 15X10 FENES (DRAPES) ×3 IMPLANT
GAUZE SPONGE 2X2 8PLY STRL LF (GAUZE/BANDAGES/DRESSINGS) IMPLANT
GAUZE SPONGE 4X4 16PLY XRAY LF (GAUZE/BANDAGES/DRESSINGS) ×3 IMPLANT
GLOVE BIOGEL PI IND STRL 7.5 (GLOVE) ×1 IMPLANT
GLOVE BIOGEL PI INDICATOR 7.5 (GLOVE) ×6
GLOVE SURG SS PI 7.5 STRL IVOR (GLOVE) ×7 IMPLANT
GOWN STRL REUS W/ TWL LRG LVL3 (GOWN DISPOSABLE) ×1 IMPLANT
GOWN STRL REUS W/ TWL XL LVL3 (GOWN DISPOSABLE) ×1 IMPLANT
GOWN STRL REUS W/TWL LRG LVL3 (GOWN DISPOSABLE) ×6
GOWN STRL REUS W/TWL XL LVL3 (GOWN DISPOSABLE) ×3
KIT BASIN OR (CUSTOM PROCEDURE TRAY) ×3 IMPLANT
KIT ROOM TURNOVER OR (KITS) ×3 IMPLANT
LIQUID BAND (GAUZE/BANDAGES/DRESSINGS) ×3 IMPLANT
NDL 18GX1X1/2 (RX/OR ONLY) (NEEDLE) ×1 IMPLANT
NDL HYPO 25GX1X1/2 BEV (NEEDLE) ×1 IMPLANT
NEEDLE 18GX1X1/2 (RX/OR ONLY) (NEEDLE) ×3 IMPLANT
NEEDLE HYPO 25GX1X1/2 BEV (NEEDLE) ×3 IMPLANT
NS IRRIG 1000ML POUR BTL (IV SOLUTION) ×3 IMPLANT
PACK SURGICAL SETUP 50X90 (CUSTOM PROCEDURE TRAY) ×3 IMPLANT
PAD ARMBOARD 7.5X6 YLW CONV (MISCELLANEOUS) ×6 IMPLANT
PENCIL BUTTON HOLSTER BLD 10FT (ELECTRODE) ×2 IMPLANT
SOAP 2 % CHG 4 OZ (WOUND CARE) ×3 IMPLANT
SPONGE GAUZE 2X2 STER 10/PKG (GAUZE/BANDAGES/DRESSINGS) ×2
SUT ETHILON 3 0 PS 1 (SUTURE) ×5 IMPLANT
SUT VICRYL 4-0 PS2 18IN ABS (SUTURE) ×3 IMPLANT
SYR 20CC LL (SYRINGE) ×6 IMPLANT
SYR 5ML LL (SYRINGE) ×3 IMPLANT
SYR CONTROL 10ML LL (SYRINGE) ×3 IMPLANT
SYRINGE 10CC LL (SYRINGE) ×3 IMPLANT
TAPE CLOTH SURG 4X10 WHT LF (GAUZE/BANDAGES/DRESSINGS) ×2 IMPLANT
WATER STERILE IRR 1000ML POUR (IV SOLUTION) ×3 IMPLANT
WIRE BENTSON .035X145CM (WIRE) ×2 IMPLANT

## 2015-09-23 NOTE — Progress Notes (Signed)
ANTICOAGULATION CONSULT NOTE - Initial Consult  Pharmacy Consult for Coumadin Indication: PE history  Allergies  Allergen Reactions  . Butalbital-Apap-Caffeine Shortness Of Breath and Swelling    Swelling in throat  . Ferrlecit [Na Ferric Gluc Cplx In Sucrose] Shortness Of Breath, Swelling and Other (See Comments)    Swelling in throat  . Minoxidil Shortness Of Breath  . Darvocet [Propoxyphene N-Acetaminophen] Hives  . Propoxyphene Hives    Allergy listed on file from [place] (Pt did not mention any allergies)     Patient Measurements: Weight:  (Refused to be weigh)  Vital Signs: Temp: 97.9 F (36.6 C) (03/04 1830) Temp Source: Oral (03/04 1245) BP: 159/117 mmHg (03/04 1830) Pulse Rate: 76 (03/04 1830)  Labs:  Recent Labs  09/21/15 0736 09/23/15 1240 09/23/15 1655  HGB 7.3* 7.4*  --   HCT 21.8* 23.8*  --   PLT 142* 173  --   APTT  --   --  26  LABPROT  --   --  16.5*  INR  --   --  1.32  CREATININE 13.26* 9.55*  --     Estimated Creatinine Clearance: 10 mL/min (by C-G formula based on Cr of 9.55).   Medical History: Past Medical History  Diagnosis Date  . Hypertension   . Depression   . Complication of anesthesia     itching, sore throat  . Diabetes mellitus without complication ([place])     No history per patient, but remains under history as A1c would not be accurate given on dialysis  . Shortness of breath   . Anxiety   . ESRD (end stage renal disease) ([place])     due to HTN per patient, followed at [facility], s/p failed kidney transplant - dialysis Tue, Th, Sat  . Renal insufficiency   . CHF (congestive heart failure) ([place])   . Anemia   . Dialysis patient [facility])    Assessment: 50 year old male on Coumadin PTA for PE history. Dose PTA = 9 mg daily INR on admission low at 1.32  Goal of Therapy:  INR 2-3 Monitor platelets by anticoagulation protocol: Yes   Plan:  Coumadin 12.5 mg po x 1 dose tonight Daily INR  Thank you Anette Guarneri,  PharmD [phone]  09/23/2015,9:06 PM

## 2015-09-23 NOTE — ED Provider Notes (Signed)
CSN: [id number]     Arrival date & time 09/23/15  6945 History   First MD Initiated Contact with Patient 09/23/15 [phone]     Chief Complaint  Patient presents with  . Chest Pain  . Shortness of Breath  . Vascular Access Problem    Patient is a 50 y.o. male presenting with chest pain and shortness of breath. The history is provided by the patient. No language interpreter was used.  Chest Pain Associated symptoms: shortness of breath   Shortness of Breath Associated symptoms: chest pain    Frank [name] is a 50 y.o. male who presents to the Emergency Department complaining of needs dialysis.  He presents to the emergency department today for a routine dialysis session. He gets dialyzed Monday, Tuesday, today, Saturday. He is currently unable to attend his dialysis center due to transportation issues. He endorses swelling in his lower abdomen and groin that happens when he needs dialysis. He also reports some shortness of breath and chest discomfort. Symptoms are mild and constant in nature. His last dialysis session was 2 days ago.  Past Medical History  Diagnosis Date  . Hypertension   . Depression   . Complication of anesthesia     itching, sore throat  . Diabetes mellitus without complication ([place])     No history per patient, but remains under history as A1c would not be accurate given on dialysis  . Shortness of breath   . Anxiety   . ESRD (end stage renal disease) ([place])     due to HTN per patient, followed at [facility], s/p failed kidney transplant - dialysis Tue, Th, Sat  . Renal insufficiency   . CHF (congestive heart failure) ([place])   . Anemia   . Dialysis patient [facility])    Past Surgical History  Procedure Laterality Date  . Kidney receipient  2006    failed and started HD in March 2014  . Capd insertion    . Capd removal    . Left heart catheterization with coronary angiogram N/A 09/02/2014    Procedure: LEFT HEART CATHETERIZATION WITH CORONARY ANGIOGRAM;  Surgeon: [name], MD;  Location: [facility] CATH LAB;  Service: Cardiovascular;  Laterality: N/A;  . Inguinal hernia repair Right 02/14/2015    Procedure: REPAIR INCARCERATED RIGHT INGUINAL HERNIA;  Surgeon: [name], MD;  Location: [place];  Service: General;  Laterality: Right;   Family History  Problem Relation Age of Onset  . Hypertension Other    Social History  Substance Use Topics  . Smoking status: Former Smoker -- 0.00 packs/day for 1 years    Types: Cigarettes  . Smokeless tobacco: Never Used     Comment: quit Jan 2014  . Alcohol Use: No    Review of Systems  Respiratory: Positive for shortness of breath.   Cardiovascular: Positive for chest pain.  All other systems reviewed and are negative.     Allergies  Butalbital-apap-caffeine; Ferrlecit; Minoxidil; Darvocet; and Propoxyphene  Home Medications   Prior to Admission medications   Medication Sig Start Date End Date Taking? Authorizing Provider  ALPRAZolam [name]) 0.5 MG tablet Take 1 tablet (0.5 mg total) by mouth 2 (two) times daily as needed for anxiety. 09/11/15  Yes [name], MD  amLODipine (NORVASC) 10 MG tablet Take 10 mg by mouth daily.   Yes Historical Provider, MD  atorvastatin (LIPITOR) 40 MG tablet Take 1 tablet (40 mg total) by mouth daily at 6 PM. 09/02/14  Yes Barton Dubois,  MD  carvedilol (COREG) 25 MG tablet Take 1 tablet (25 mg total) by mouth 2 (two) times daily with a meal. 10/10/14  Yes [name], DO  cinacalcet (SENSIPAR) 30 MG tablet Take 30 mg by mouth daily.   Yes Historical Provider, MD  docusate sodium (COLACE) 100 MG capsule Take 1 capsule (100 mg total) by mouth 2 (two) times daily. 11/19/14  Yes [name], MD  doxercalciferol (HECTOROL) 4 MCG/2ML injection Inject 1.25 mLs (2.5 mcg total) into the vein Every Tuesday,Thursday,and Saturday with dialysis. 10/10/14  Yes [name], DO  hydrALAZINE (APRESOLINE) 50 MG tablet Take 2 tablets (100 mg total) by mouth every 8 (eight) hours. 09/11/15   Yes [name], MD  hydrocerin (EUCERIN) CREA Apply 1 application topically 2 (two) times daily. 02/04/15  Yes [name], MD  HYDROcodone-acetaminophen (NORCO/VICODIN) 5-325 MG tablet Take 1 tablet by mouth every 6 (six) hours as needed. Patient taking differently: Take 1 tablet by mouth every 6 (six) hours as needed for moderate pain.  09/11/15  Yes [name], MD  isosorbide mononitrate (IMDUR) 30 MG 24 hr tablet Take 3 tablets (90 mg total) by mouth daily. 09/11/15  Yes [name], MD  levalbuterol [facility] HFA) 45 MCG/ACT inhaler Inhale 2 puffs into the lungs every 6 (six) hours as needed for wheezing or shortness of breath. 11/19/14  Yes [name], MD  lisinopril (PRINIVIL,ZESTRIL) 20 MG tablet Take 1 tablet (20 mg total) by mouth daily. 02/04/15  Yes [name], MD  multivitamin (RENA-VIT) TABS tablet Take 1 tablet by mouth at bedtime. 02/04/15  Yes [name], MD  omeprazole (PRILOSEC) 20 MG capsule Take 20 mg by mouth daily. 07/01/13  Yes Historical Provider, MD  predniSONE (DELTASONE) 5 MG tablet Take 5 mg by mouth daily with breakfast.   Yes Historical Provider, MD  sevelamer carbonate (RENVELA) 800 MG tablet Take 800-1,600 mg by mouth 3 (three) times daily with meals. 2 tabs three times daily with meals, and 1 tablet with snacks   Yes Historical Provider, MD  warfarin (COUMADIN) 6 MG tablet Take 1.5 tablets (9 mg total) by mouth daily at 6 PM. Take daily until adjusted per MD 09/12/15  Yes [name], MD  gabapentin (NEURONTIN) 100 MG capsule Take 100 mg by mouth 3 (three) times daily. 06/14/15 09/21/15  Historical Provider, MD   BP 174/99 mmHg  Pulse 79  Temp(Src) 98.2 F (36.8 C) (Oral)  Resp 16  SpO2 100% Physical Exam  Constitutional: He is oriented to person, place, and time. He appears well-developed and well-nourished.  Chronically ill-appearing  HENT:  Head: Normocephalic and atraumatic.  Cardiovascular: Normal rate and regular rhythm.   No  murmur heard. Pulmonary/Chest: Effort normal and breath sounds normal. No respiratory distress.  Vas-Cath in the right upper chest wall  Abdominal: Soft. There is no rebound and no guarding.  Mild diffuse abdominal tenderness without any palpable masses  Musculoskeletal: He exhibits no edema or tenderness.  Neurological: He is alert and oriented to person, place, and time.  Skin: Skin is warm and dry.  Dry and flaking skin  Psychiatric: He has a normal mood and affect. His behavior is normal.  Nursing note and vitals reviewed.   ED Course  Procedures (including critical care time) Labs Review Labs Reviewed - No data to display  Imaging Review Dg Chest 2 View  09/23/2015  CLINICAL DATA:  Chest pain and shortness of breath, dialysis patient  EXAM: CHEST  2 VIEW COMPARISON:  09/21/2015 FINDINGS: Dialysis catheter unchanged with tip into the right atrium. Severe cardiac silhouette enlargement. Central vascular congestion similar to prior study. No pulmonary edema or pleural effusion. IMPRESSION: Cardiomegaly with central vascular congestion similar to prior studies with no acute findings Electronically Signed   By: Skipper Cliche M.D.   On: 09/23/2015 07:55   I have personally reviewed and evaluated these images and lab results as part of my medical decision-making.   EKG Interpretation   Date/Time:  Saturday September 23 2015 06:50:13 EST Ventricular Rate:  78 PR Interval:  178 QRS Duration: 96 QT Interval:  422 QTC Calculation: 481 R Axis:   52 Text Interpretation:  Normal sinus rhythm Left ventricular hypertrophy  with repolarization abnormality Prolonged QT Abnormal ECG Confirmed by  Hazle Coca [id number]) on 09/23/2015 7:10:19 AM      MDM   Final diagnoses:  ESRD on hemodialysis [facility])    Patient with history of end-stage renal disease on hemodialysis presents emergency department for a routine dialysis session. He has no acute complaints at this time. Provided doses of Zofran for  some nausea. Plan to admit for dialysis.  Quintella Reichert, MD 09/23/15 1019

## 2015-09-23 NOTE — Anesthesia Preprocedure Evaluation (Deleted)
Anesthesia Evaluation    Airway        Dental   Pulmonary former smoker,           Cardiovascular hypertension,      Neuro/Psych    GI/Hepatic   Endo/Other  diabetes  Renal/GU      Musculoskeletal   Abdominal   Peds  Hematology   Anesthesia Other Findings   Reproductive/Obstetrics                             Anesthesia Physical Anesthesia Plan Anesthesia Quick Evaluation

## 2015-09-23 NOTE — ED Notes (Signed)
Pt upset d/t his lunch order request was changed by svc response d/t "renal diet". Pt took dessert but refused chicken sandwich and green beans. States he is not able to go to dialysis w/o eating. Pt requested [place] sandwich - heated and apple juice. RN gave pt 2 sandwiches -  Pt wanted them warmer - RN re-heated per pt request and gave to pt.

## 2015-09-23 NOTE — ED Notes (Signed)
Patient here with chest pain and shortness of breath, needing dialysis today.  Patient states that his last dialysis was on Thursday.   Patient is feeling bloated and swollen in groin.

## 2015-09-23 NOTE — H&P (Signed)
[facility] History and Physical  Frank [name] FMB:[id number] DOB: Feb 26, 1966 DOA: 09/23/2015  Referring physician: Dr. Justin Mend PCP: [name] Patient   Chief Complaint: Shortness of breath and chest pain  HPI:  Frank [name] is a 50 year old male with past medical history of ESRD on HD(T/TH/Sat), combined systolic and diastolic congestive heart failure last EF 20-25% in 08/2015, diabetes mellitus type 2, anxiety/depression, PE on chronic anticoagulation of Coumadin, anemia of chronic disease, and noncompliance; who presented to receive dialysis with reports of shortness of breath and chest discomfort. He reports being unable to dialyze at his dialysis center due to transportation issues. Patient's last hemodialysis session was apparently 2 days ago. It appears he does not have a set routine when he dialyzes and nephrology who called for the patient to be admitted questions his compliance with taking his medications. The patient himself notes that he takes them from a bundle pack all at one time daily. Today during dialysis however patient notes that he woke up and there was blood all over him. Somehow the catheter was noted to have a hole in it which required replacing. Vascular surgery was consulted and patient had to be placed under general anesthesia for the catheter to be replaced. Patient had successful exchange of the catheter and was sent to the PACU thereafter transferred to the dialysis unit to finish his hemodialysis session. Due to the patient receiving general anesthesia and his need for subsequent hemodialysis for his fluid overload status is recommended that he be admitted for observation overnight. Patient still complains of some shortness of breath and chest discomfort from where the new catheter was placed. Notes fentanyl did not provide adequate relief of symptoms. Shortness of breath symptoms are still present.  Review of Systems  Constitutional: Positive for malaise/fatigue. Negative  for diaphoresis.  HENT: Negative for ear pain.   Eyes: Negative for photophobia and pain.  Respiratory: Positive for shortness of breath.   Cardiovascular: Positive for chest pain and leg swelling.  Gastrointestinal: Negative for abdominal pain and diarrhea.  Genitourinary: Negative for urgency and frequency.  Musculoskeletal: Positive for joint pain. Negative for falls.  Skin: Negative for itching and rash.  Neurological: Negative for focal weakness and seizures.  Endo/Heme/Allergies: Negative for environmental allergies and polydipsia.  Psychiatric/Behavioral: Negative for hallucinations and substance abuse.      Past Medical History  Diagnosis Date  . Hypertension   . Depression   . Complication of anesthesia     itching, sore throat  . Diabetes mellitus without complication ([place])     No history per patient, but remains under history as A1c would not be accurate given on dialysis  . Shortness of breath   . Anxiety   . ESRD (end stage renal disease) ([place])     due to HTN per patient, followed at [facility], s/p failed kidney transplant - dialysis Tue, Th, Sat  . Renal insufficiency   . CHF (congestive heart failure) ([place])   . Anemia   . Dialysis patient [facility])      Past Surgical History  Procedure Laterality Date  . Kidney receipient  2006    failed and started HD in March 2014  . Capd insertion    . Capd removal    . Left heart catheterization with coronary angiogram N/A 09/02/2014    Procedure: LEFT HEART CATHETERIZATION WITH CORONARY ANGIOGRAM;  Surgeon: [name], MD;  Location: [facility] CATH LAB;  Service: Cardiovascular;  Laterality: N/A;  . Inguinal hernia repair Right  02/14/2015    Procedure: REPAIR INCARCERATED RIGHT INGUINAL HERNIA;  Surgeon: [name], MD;  Location: [facility];  Service: General;  Laterality: Right;      Social History:  reports that he has quit smoking. His smoking use included Cigarettes. He smoked 0.00 packs per day for 1 year. He has never  used smokeless tobacco. He reports that he does not drink alcohol or use illicit drugs.   Allergies  Allergen Reactions  . Butalbital-Apap-Caffeine Shortness Of Breath and Swelling    Swelling in throat  . Ferrlecit [Na Ferric Gluc Cplx In Sucrose] Shortness Of Breath, Swelling and Other (See Comments)    Swelling in throat  . Minoxidil Shortness Of Breath  . Darvocet [Propoxyphene N-Acetaminophen] Hives  . Propoxyphene Hives    Allergy listed on file from [place] (Pt did not mention any allergies)     Family History  Problem Relation Age of Onset  . Hypertension Other       Prior to Admission medications   Medication Sig Start Date End Date Taking? Authorizing Provider  ALPRAZolam [name]) 0.5 MG tablet Take 1 tablet (0.5 mg total) by mouth 2 (two) times daily as needed for anxiety. 09/11/15  Yes [name], MD  amLODipine (NORVASC) 10 MG tablet Take 10 mg by mouth daily.   Yes Historical Provider, MD  atorvastatin (LIPITOR) 40 MG tablet Take 1 tablet (40 mg total) by mouth daily at 6 PM. 09/02/14  Yes Barton Dubois, MD  carvedilol (COREG) 25 MG tablet Take 1 tablet (25 mg total) by mouth 2 (two) times daily with a meal. 10/10/14  Yes [name], DO  cinacalcet (SENSIPAR) 30 MG tablet Take 30 mg by mouth daily.   Yes Historical Provider, MD  docusate sodium (COLACE) 100 MG capsule Take 1 capsule (100 mg total) by mouth 2 (two) times daily. 11/19/14  Yes [name], MD  doxercalciferol (HECTOROL) 4 MCG/2ML injection Inject 1.25 mLs (2.5 mcg total) into the vein Every Tuesday,Thursday,and Saturday with dialysis. 10/10/14  Yes [name], DO  hydrALAZINE (APRESOLINE) 50 MG tablet Take 2 tablets (100 mg total) by mouth every 8 (eight) hours. 09/11/15  Yes [name], MD  hydrocerin (EUCERIN) CREA Apply 1 application topically 2 (two) times daily. 02/04/15  Yes [name], MD  HYDROcodone-acetaminophen (NORCO/VICODIN) 5-325 MG tablet Take 1 tablet by mouth every 6  (six) hours as needed. Patient taking differently: Take 1 tablet by mouth every 6 (six) hours as needed for moderate pain.  09/11/15  Yes [name], MD  isosorbide mononitrate (IMDUR) 30 MG 24 hr tablet Take 3 tablets (90 mg total) by mouth daily. 09/11/15  Yes [name], MD  levalbuterol [facility] HFA) 45 MCG/ACT inhaler Inhale 2 puffs into the lungs every 6 (six) hours as needed for wheezing or shortness of breath. 11/19/14  Yes [name], MD  lisinopril (PRINIVIL,ZESTRIL) 20 MG tablet Take 1 tablet (20 mg total) by mouth daily. 02/04/15  Yes [name], MD  multivitamin (RENA-VIT) TABS tablet Take 1 tablet by mouth at bedtime. 02/04/15  Yes [name], MD  omeprazole (PRILOSEC) 20 MG capsule Take 20 mg by mouth daily. 07/01/13  Yes Historical Provider, MD  predniSONE (DELTASONE) 5 MG tablet Take 5 mg by mouth daily with breakfast.   Yes Historical Provider, MD  sevelamer carbonate (RENVELA) 800 MG tablet Take 800-1,600 mg by mouth 3 (three) times daily with meals. 2 tabs three times daily  with meals, and 1 tablet with snacks   Yes Historical Provider, MD  warfarin (COUMADIN) 6 MG tablet Take 1.5 tablets (9 mg total) by mouth daily at 6 PM. Take daily until adjusted per MD 09/12/15  Yes [name], MD  gabapentin (NEURONTIN) 100 MG capsule Take 100 mg by mouth 3 (three) times daily. 06/14/15 09/21/15  Historical Provider, MD     Physical Exam: Filed Vitals:   09/23/15 1315 09/23/15 1759 09/23/15 1814 09/23/15 1830  BP: 196/130 143/102 150/108 159/117  Pulse: 88 78 80 76  Temp:  98 F (36.7 C)  97.9 F (36.6 C)  TempSrc:      Resp:   15 14  SpO2:  98% 96% 100%     Constitutional: Vital signs reviewed. Patient is a well-developed and well-nourished in no acute distress and cooperative with exam. Alert and oriented x3. Watching the basketball game during dialysis. Head: Normocephalic and atraumatic  Ear: TM normal bilaterally  Mouth: no erythema or exudates, MMM   Eyes: PERRL, EOMI, conjunctivae normal, No scleral icterus.  Neck: Supple, Trachea midline normal ROM, No JVD, mass, thyromegaly, or carotid bruit present.  Cardiovascular: RRR, S1 normal, S2 normal, no MRG, pulses symmetric and intact bilaterally . Vascular access in place on the right side of the chest as patients receiving dialysis currently Pulmonary/Chest: Normal effort with scant scattered crackles appreciated, but no wheezes. Abdominal: Soft. Non-tender, non-distended, bowel sounds are normal, no masses, organomegaly, or guarding present.  GU: no CVA tenderness Musculoskeletal: No joint deformities, erythema, or stiffness, ROM full and no nontender Ext: Bilateral trace pedal edema and no cyanosis, pulses palpable bilaterally (DP and PT)  Hematology: no cervical, inginal, or axillary adenopathy.  Neurological: A&O x3, Strenght is normal and symmetric bilaterally, cranial nerve II-XII are grossly intact, no focal motor deficit, sensory intact to light touch bilaterally.  Skin: Warm, dry and intact. No rash, cyanosis, or clubbing.  Psychiatric: Normal mood and affect. speech and behavior is normal. Judgment and thought content normal. Cognition and memory are normal.      Data Review   Micro Results No results found for this or any previous visit (from the past 240 hour(s)).  Radiology Reports Dg Chest 2 View  09/23/2015  CLINICAL DATA:  Chest pain and shortness of breath, dialysis patient EXAM: CHEST  2 VIEW COMPARISON:  09/21/2015 FINDINGS: Dialysis catheter unchanged with tip into the right atrium. Severe cardiac silhouette enlargement. Central vascular congestion similar to prior study. No pulmonary edema or pleural effusion. IMPRESSION: Cardiomegaly with central vascular congestion similar to prior studies with no acute findings Electronically Signed   By: Skipper Cliche M.D.   On: 09/23/2015 07:55   Dg Chest 2 View  09/21/2015  CLINICAL DATA:  Shortness of breath. EXAM: CHEST  2  VIEW COMPARISON:  September 19, 2015. FINDINGS: Stable cardiomegaly. Right internal jugular dialysis catheter is unchanged with distal tip in expected position of right atrium. No pneumothorax or pleural effusion is noted. No acute pulmonary disease is noted. Mild central pulmonary vascular congestion is noted. Bony thorax is unremarkable. IMPRESSION: Stable cardiomegaly with central pulmonary vascular congestion. Electronically Signed   By: Marijo Conception, M.D.   On: 09/21/2015 07:21   Dg Chest 2 View  09/19/2015  CLINICAL DATA:  Acute onset of shortness of breath, nausea and vomiting. Mid chest pain and cough. Initial encounter. EXAM: CHEST  2 VIEW COMPARISON:  Chest radiograph from 09/16/2015 FINDINGS: Vascular congestion is noted. Mildly increased interstitial markings  raise concern for mild interstitial edema. No pleural effusion or pneumothorax is seen. The cardiomediastinal silhouette is enlarged. No acute osseous abnormalities are identified. A right-sided dual-lumen catheter is noted ending within the right atrium. IMPRESSION: Vascular congestion and cardiomegaly. Mildly increased interstitial markings raise concern for mild interstitial edema. Electronically Signed   By: Garald Balding M.D.   On: 09/19/2015 22:58   Dg Chest 2 View  09/16/2015  CLINICAL DATA:  Acute onset of generalized chest pain, nausea and vomiting. Initial encounter. EXAM: CHEST  2 VIEW COMPARISON:  Chest radiograph performed 09/08/2015 FINDINGS: The lungs are well-aerated. Vascular congestion is noted. Mild right-sided and left basilar airspace opacities could reflect mild interstitial edema or pneumonia. There is no evidence of pleural effusion or pneumothorax. The heart is enlarged. A right-sided dual-lumen catheter is noted ending at the right atrium. No acute osseous abnormalities are seen. IMPRESSION: Vascular congestion and cardiomegaly. Mild right-sided and left basilar airspace opacities could reflect mild interstitial  edema or pneumonia. Electronically Signed   By: Garald Balding M.D.   On: 09/16/2015 03:02   Dg Chest 2 View  09/08/2015  CLINICAL DATA:  Chest pain radiating to the left arm. Shortness of breath. Nausea and vomiting beginning tonight. Cough and congestion. EXAM: CHEST  2 VIEW COMPARISON:  09/01/2015 FINDINGS: Right central venous catheter with tip over the cavoatrial junction/upper RA. Diffuse cardiac enlargement. Mild pulmonary vascular congestion. No edema or consolidation. No blunting of costophrenic angles. No pneumothorax. IMPRESSION: Cardiac enlargement with mild pulmonary vascular congestion suggesting fluid overload. No edema or consolidation. Electronically Signed   By: Lucienne Capers M.D.   On: 09/08/2015 02:12   Dg Chest 2 View  09/02/2015  CLINICAL DATA:  50 year old male with shortness of breath and chest pain EXAM: CHEST  2 VIEW COMPARISON:  Radiograph dated 08/25/2015 FINDINGS: 50 year old catheter is noted with tip over the right atrium in stable positioning. There is stable cardiomegaly with mild vascular congestion. No focal consolidation, pleural effusion, or pneumothorax. No acute osseous pathology. IMPRESSION: No interval change. Electronically Signed   By: Anner Crete M.D.   On: 09/02/2015 00:02   Dg Chest Port 1v Same Day  09/23/2015  CLINICAL DATA:  Encounter for central line placement EXAM: PORTABLE CHEST 1 VIEW COMPARISON:  09/23/2015 FINDINGS: There is a right chest wall dialysis catheter with tips in the cavoatrial junction. No pneumothorax identified. Cardiac enlargement and pulmonary vascular congestion noted. IMPRESSION: 1. No complication after right chest wall dialysis catheter placement with tips in the cavoatrial junction. Electronically Signed   By: Kerby Moors M.D.   On: 09/23/2015 18:46   Dg Abd Acute W/chest  08/25/2015  CLINICAL DATA:  BILATERAL groin pain greater on RIGHT, recent hernia surgery 4 days ago, hypertension, diabetes mellitus, end- stage renal  disease on dialysis, CHF, former smoker EXAM: DG ABDOMEN ACUTE W/ 1V CHEST COMPARISON:  08/19/2015 chest radiograph, abdominal radiograph 02/11/2015 FINDINGS: RIGHT jugular dual-lumen central venous catheter with tip projecting over high RIGHT atrium. Enlargement of cardiac silhouette with pulmonary vascular congestion. Central peribronchial thickening with question minimal perihilar edema asymmetric on RIGHT. Remaining lungs clear. No definite pleural effusion or pneumothorax. Normal bowel gas pattern with scattered stool and gas throughout colon. No bowel dilatation, bowel wall thickening, or free intraperitoneal air. Osseous structures unremarkable. Scattered atherosclerotic calcifications. No definite urinary tract calcification. IMPRESSION: Enlargement of cardiac silhouette with pulmonary vascular congestion and question minimal asymmetric RIGHT perihilar edema. No acute abdominal findings. Electronically Signed   By: Elta Guadeloupe  Thornton Papas M.D.   On: 08/25/2015 14:37   Dg Fluoro Guide Cv Line-no Report  09/23/2015  CLINICAL DATA:  FLOURO GUIDE CV LINE Fluoroscopy was utilized by the requesting physician.  No radiographic interpretation.     CBC  Recent Labs Lab 09/18/15 0700 09/18/15 1400 09/19/15 2207 09/21/15 0736 09/23/15 1240  WBC 6.6 5.6 4.7 7.1 6.3  HGB 7.8* 7.7* 6.8* 7.3* 7.4*  HCT 24.7* 23.3* 21.1* 21.8* 23.8*  PLT 184 155 160 142* 173  MCV 86.7 86.9 85.4 87.2 88.5  MCH 27.4 28.7 27.5 29.2 27.5  MCHC 31.6 33.0 32.2 33.5 31.1  RDW 14.7 14.8 14.6 15.1 15.2  LYMPHSABS  --   --   --  0.7  --   MONOABS  --   --   --  0.4  --   EOSABS  --   --   --  0.1  --   BASOSABS  --   --   --  0.0  --     Chemistries   Recent Labs Lab 09/18/15 0700 09/18/15 1400 09/19/15 2207 09/21/15 0736 09/23/15 1240  NA 136 137 137 137 140  K 4.6 4.5 4.8 4.9 5.1  CL 100* 100* 101 101 99*  CO2 21* 23 26 19* 28  GLUCOSE 79 107* 158* 143* 138*  BUN 65* 68* 49* 76* 55*  CREATININE 11.96* 12.42* 11.21*  13.26* 9.55*  CALCIUM 8.4* 8.4* 8.3* 8.2* 8.6*  AST 50*  --   --   --   --   ALT 19  --   --   --   --   ALKPHOS 127*  --   --   --   --   BILITOT 0.5  --   --   --   --    ------------------------------------------------------------------------------------------------------------------ estimated creatinine clearance is 10 mL/min (by C-G formula based on Cr of 9.55). ------------------------------------------------------------------------------------------------------------------ No results for input(s): HGBA1C in the last 72 hours. ------------------------------------------------------------------------------------------------------------------ No results for input(s): CHOL, HDL, LDLCALC, TRIG, CHOLHDL, LDLDIRECT in the last 72 hours. ------------------------------------------------------------------------------------------------------------------ No results for input(s): TSH, T4TOTAL, T3FREE, THYROIDAB in the last 72 hours.  Invalid input(s): FREET3 ------------------------------------------------------------------------------------------------------------------ No results for input(s): VITAMINB12, FOLATE, FERRITIN, TIBC, IRON, RETICCTPCT in the last 72 hours.  Coagulation profile  Recent Labs Lab 09/18/15 0700 09/23/15 1655  INR 1.83* 1.32    No results for input(s): DDIMER in the last 72 hours.  Cardiac Enzymes No results for input(s): CKMB, TROPONINI, MYOGLOBIN in the last 168 hours.  Invalid input(s): CK ------------------------------------------------------------------------------------------------------------------ Invalid input(s): POCBNP   CBG: No results for input(s): GLUCAP in the last 168 hours.     EKG: Independently reviewed. Normal sinus rhythm with a prolonged [id number]   Assessment/Plan Acute on chronic combined systolic and diastolic congestive heart failure: Patient's last echocardiogram was performed on 09/09/2015 EF was noted to be 20-25% with grade  2 diastolic dysfunction. Patient presents to the hospital with complaints of shortness of breath and chest pain. Chest x-ray showing pulmonary congestion and cardiomegaly. - Admit to a telemetry bed overnight - Strict ins and outs - Renal and car modified diet with fluid restriction - Continue isosorbide mononitrate, lisinopril, hydralazine, and Coreg - Care management for help with compliance issues and medications  Vascular access complication:  Patient required replacement of his access catheter for hemodialysis. Replacement performed under general anesthesia with vascular surgery today. Patient suffered no complications with the procedure and successfully receive dialysis this evening. Patient still complaining of pain around vascular access point. -  Continue to monitor  - Home medications of hydrocodone for moderate pain and Dilaudid prn severe pain  ESRD on hemodialysis: Patient was able to receive dialysis after initial access complications. BUN 55 and creatinine 9.5 on admission. - renal panel in A.M. - Nephrology on board Dr. Justin Mend - Patient likely to receive hemodialysis in a.m.  - Continue Sensipar   Chest pain and dyspnea  - probably from fluid overload and hypertensive urgency.  - Cycle cardiac troponin  - Duonebs as needed  Diabetes mellitus type 2: Previously well controlled last hemoglobin A1c 7 months ago 5.4. Patient currently requiring no medications for treatment. - Continue to monitor, would schedule CBGs if seen to be greater than 180s consistently  Hypertensive urgency - blood pressure as high as 201/133 on admission. Likely due to missed dialysis and noncompliance with medications. - Continue hydralazine, amlodipine, and all other blood pressure medications as tolerated as seen above   Anemia of chronic disease ESRD: Hemoglobin 7.4 on admission. - follow CBC nephrology managing.  Chronic PE on chronic anticoagulation therapy with subtherapeutic INR 1.32. Patient  appears not to be very compliant with Coumadin. - coumadin per pharmacy  Hyperlipidemia  - Continue atorvastatin   Heparin for DVT prophylaxis  Code Status:   full Family Communication: bedside Disposition Plan: admit   Total time spent 55 minutes.Greater than 50% of this time was spent in counseling, explanation of diagnosis, planning of further management, and coordination of care  [facility] Pager [phone]  If 7PM-7AM, please contact night-coverage [email or web address] Password [email or web address] 09/23/2015, 8:14 PM

## 2015-09-23 NOTE — Progress Notes (Signed)
Noted a blood from patients linen, offered catheter dressing to be check but patient initially refused for me to change . I explained to the patient that I need to check where the bleeding is coming from. Patient claimed he is on heparin. No skin cut noted on the site. Patient eventually agreed for me to change the dressing, bleeding not coming from catheter insertion site. As i was cleaning the port, I noted a blood coming out from the catheter itself , catheter has a hole from the arterial site. MD was made aware by the charge nurse, patient was rinse back, catheter was flushed and capped per facility protocol.

## 2015-09-23 NOTE — Anesthesia Postprocedure Evaluation (Signed)
Anesthesia Post Note  Patient: Frank [name]  Procedure(s) Performed: Procedure(s) (LRB): exchange of Right internal Dialysis Catheter. (Right)  Patient location during evaluation: PACU Anesthesia Type: General Level of consciousness: awake and alert Pain management: pain level controlled Vital Signs Assessment: post-procedure vital signs reviewed and stable Respiratory status: spontaneous breathing, nonlabored ventilation, respiratory function stable and patient connected to nasal cannula oxygen Cardiovascular status: blood pressure returned to baseline and stable Postop Assessment: no signs of nausea or vomiting Anesthetic complications: no    Last Vitals:  Filed Vitals:   09/23/15 1814 09/23/15 1830  BP: 150/108 159/117  Pulse: 80 76  Temp:  36.6 C  Resp: 15 14    Last Pain:  Filed Vitals:   09/23/15 1904  PainSc: 10-Worst pain ever                 Tiajuana Amass

## 2015-09-23 NOTE — Consult Note (Signed)
Patient name: Frank [name] MRN: [id number] DOB: Jul 06, 1966 Sex: male     Chief Complaint  Patient presents with  . Chest Pain  . Shortness of Breath  . Vascular Access Problem    HISTORY OF PRESENT ILLNESS: 50 yo with ESRD.  Noted to have hole in catheter and change is requested  Past Medical History  Diagnosis Date  . Hypertension   . Depression   . Complication of anesthesia     itching, sore throat  . Diabetes mellitus without complication ([place])     No history per patient, but remains under history as A1c would not be accurate given on dialysis  . Shortness of breath   . Anxiety   . ESRD (end stage renal disease) ([place])     due to HTN per patient, followed at [facility], s/p failed kidney transplant - dialysis Tue, Th, Sat  . Renal insufficiency   . CHF (congestive heart failure) ([place])   . Anemia   . Dialysis patient [facility])     Past Surgical History  Procedure Laterality Date  . Kidney receipient  2006    failed and started HD in March 2014  . Capd insertion    . Capd removal    . Left heart catheterization with coronary angiogram N/A 09/02/2014    Procedure: LEFT HEART CATHETERIZATION WITH CORONARY ANGIOGRAM;  Surgeon: [name], MD;  Location: [facility] CATH LAB;  Service: Cardiovascular;  Laterality: N/A;  . Inguinal hernia repair Right 02/14/2015    Procedure: REPAIR INCARCERATED RIGHT INGUINAL HERNIA;  Surgeon: [name], MD;  Location: [place];  Service: General;  Laterality: Right;    Social History   Social History  . Marital Status: Married    Spouse Name: N/A  . Number of Children: 3  . Years of Education: [employer]   Occupational History  . Not on file.   Social History Main Topics  . Smoking status: Former Smoker -- 0.00 packs/day for 1 years    Types: Cigarettes  . Smokeless tobacco: Never Used     Comment: quit Jan 2014  . Alcohol Use: No  . Drug Use: No  . Sexual Activity: Not Currently   Other Topics Concern  . Not on file   Social History  Narrative   Owns own plumbing company    Family History  Problem Relation Age of Onset  . Hypertension Other     Allergies as of 09/23/2015 - Review Complete 09/23/2015  Allergen Reaction Noted  . Butalbital-apap-caffeine Shortness Of Breath and Swelling 08/18/2015  . Ferrlecit [na ferric gluc cplx in sucrose] Shortness Of Breath, Swelling, and Other (See Comments) 06/24/2013  . Minoxidil Shortness Of Breath 08/18/2015  . Darvocet [propoxyphene n-acetaminophen] Hives 10/16/2011  . Propoxyphene Hives 08/18/2015    No current facility-administered medications on file prior to encounter.   Current Outpatient Prescriptions on File Prior to Encounter  Medication Sig Dispense Refill  . ALPRAZolam (XANAX) 0.5 MG tablet Take 1 tablet (0.5 mg total) by mouth 2 (two) times daily as needed for anxiety. 10 tablet 0  . amLODipine (NORVASC) 10 MG tablet Take 10 mg by mouth daily.    [name] atorvastatin (LIPITOR) 40 MG tablet Take 1 tablet (40 mg total) by mouth daily at 6 PM. 30 tablet 1  . carvedilol (COREG) 25 MG tablet Take 1 tablet (25 mg total) by mouth 2 (two) times daily with a meal.    . cinacalcet (SENSIPAR) 30 MG tablet Take 30 mg  by mouth daily.    [name] docusate sodium (COLACE) 100 MG capsule Take 1 capsule (100 mg total) by mouth 2 (two) times daily. 60 capsule 0  . doxercalciferol (HECTOROL) 4 MCG/2ML injection Inject 1.25 mLs (2.5 mcg total) into the vein Every Tuesday,Thursday,and Saturday with dialysis. 2 mL   . hydrALAZINE (APRESOLINE) 50 MG tablet Take 2 tablets (100 mg total) by mouth every 8 (eight) hours. 90 tablet 1  . hydrocerin (EUCERIN) CREA Apply 1 application topically 2 (two) times daily. 454 g 0  . HYDROcodone-acetaminophen (NORCO/VICODIN) 5-325 MG tablet Take 1 tablet by mouth every 6 (six) hours as needed. (Patient taking differently: Take 1 tablet by mouth every 6 (six) hours as needed for moderate pain. ) 10 tablet 0  . isosorbide mononitrate (IMDUR) 30 MG 24 hr tablet Take  3 tablets (90 mg total) by mouth daily. 90 tablet 0  . levalbuterol (XOPENEX HFA) 45 MCG/ACT inhaler Inhale 2 puffs into the lungs every 6 (six) hours as needed for wheezing or shortness of breath. 1 Inhaler 2  . lisinopril (PRINIVIL,ZESTRIL) 20 MG tablet Take 1 tablet (20 mg total) by mouth daily. 30 tablet 1  . multivitamin (RENA-VIT) TABS tablet Take 1 tablet by mouth at bedtime. 30 tablet 1  . omeprazole (PRILOSEC) 20 MG capsule Take 20 mg by mouth daily.    . predniSONE (DELTASONE) 5 MG tablet Take 5 mg by mouth daily with breakfast.    . sevelamer carbonate (RENVELA) 800 MG tablet Take 800-1,600 mg by mouth 3 (three) times daily with meals. 2 tabs three times daily with meals, and 1 tablet with snacks    . warfarin (COUMADIN) 6 MG tablet Take 1.5 tablets (9 mg total) by mouth daily at 6 PM. Take daily until adjusted per MD 20 tablet 0  . gabapentin (NEURONTIN) 100 MG capsule Take 100 mg by mouth 3 (three) times daily.         PHYSICAL EXAMINATION:   Vital signs are  Filed Vitals:   09/23/15 1145 09/23/15 1245 09/23/15 1250 09/23/15 1315  BP: 188/123 201/133 190/119 196/130  Pulse: 82 81 83 88  Temp: 97.7 F (36.5 C) 98.1 F (36.7 C)    TempSrc: Oral Oral    Resp: 16     SpO2: 100% 100%     There is no weight on file to calculate BMI. General: The patient appears their stated age. HEENT:  No gross abnormalities Pulmonary:  Non labored breathing Musculoskeletal: There are no major deformities. Neurologic: No focal weakness or paresthesias are detected, Skin: There are no ulcer or rashes noted. Psychiatric: The patient has normal affect. Cardiovascular: There is a regular rate and rhythm without significant murmur appreciated.    Assessment: ESRD Plan: Will exchange dialysis catheter  V. Leia Alf, M.D. [facility] [place] Office: [phone] Pager:  [phone]

## 2015-09-23 NOTE — ED Notes (Addendum)
Pt ambulatory to Pod C 25 w/NT. Pt noted to be wearing hospital gowns x 2. Kendal Hymen, RN, advised pt Dr Ralene Bathe advised no pain meds to be given. Voiced understanding. Pt then asking for food and also c/o nausea. This RN requested for pt to give time for Zofran to be effective, then will bring him Sprite and crackers. Voiced understanding.

## 2015-09-23 NOTE — Transfer of Care (Signed)
Immediate Anesthesia Transfer of Care Note  Patient: Frank [name]  Procedure(s) Performed: Procedure(s): exchange of Right internal Dialysis Catheter. (Right)  Patient Location: PACU  Anesthesia Type:General  Level of Consciousness: awake  Airway & Oxygen Therapy: Patient Spontanous Breathing  Post-op Assessment: Report given to RN and Post -op Vital signs reviewed and stable  Post vital signs: Reviewed and stable  Last Vitals:  Filed Vitals:   09/23/15 1250 09/23/15 1315  BP: 190/119 196/130  Pulse: 83 88  Temp:    Resp:      Complications: No apparent anesthesia complications

## 2015-09-23 NOTE — ED Notes (Signed)
Pt on phone w/svc response requesting lunch tray. Advised pt RN faxed lunch orders and called svc response to request his tray to be brought early. Pt returned to room.

## 2015-09-23 NOTE — ED Notes (Signed)
Pt requesting something for pain and nausea. Dr Ralene Bathe made aware.

## 2015-09-23 NOTE — Anesthesia Preprocedure Evaluation (Addendum)
Anesthesia Evaluation  Patient identified by MRN, date of birth, ID band Patient awake    Reviewed: Allergy & Precautions, NPO status , Patient's Chart, lab work & pertinent test results  Airway Mallampati: II  TM Distance: >3 FB Neck ROM: Full    Dental  (+) Dental Advisory Given, Teeth Intact   Pulmonary former smoker,    Pulmonary exam normal        Cardiovascular hypertension, Pt. on medications and Pt. on home beta blockers + Peripheral Vascular Disease and +CHF  Normal cardiovascular exam     Neuro/Psych Anxiety Depression negative neurological ROS     GI/Hepatic negative GI ROS, Neg liver ROS,   Endo/Other  diabetes  Renal/GU ESRF and DialysisRenal disease     Musculoskeletal   Abdominal   Peds  Hematology  (+) anemia ,   Anesthesia Other Findings   Reproductive/Obstetrics                          Lab Results  Component Value Date   WBC 6.3 09/23/2015   HGB 7.4* 09/23/2015   HCT 23.8* 09/23/2015   MCV 88.5 09/23/2015   PLT 173 09/23/2015   Lab Results  Component Value Date   CREATININE 9.55* 09/23/2015   BUN 55* 09/23/2015   NA 140 09/23/2015   K 5.1 09/23/2015   CL 99* 09/23/2015   CO2 28 09/23/2015    Anesthesia Physical Anesthesia Plan  ASA: III  Anesthesia Plan: MAC and General   Post-op Pain Management:    Induction: Intravenous  Airway Management Planned: Oral ETT and Natural Airway  Additional Equipment:   Intra-op Plan:   Post-operative Plan: Extubation in OR  Informed Consent: I have reviewed the patients History and Physical, chart, labs and discussed the procedure including the risks, benefits and alternatives for the proposed anesthesia with the patient or authorized representative who has indicated his/her understanding and acceptance.   Dental advisory given  Plan Discussed with: CRNA  Anesthesia Plan Comments:         Anesthesia  Quick Evaluation

## 2015-09-23 NOTE — Anesthesia Procedure Notes (Signed)
Procedure Name: Intubation Date/Time: 09/23/2015 4:44 PM Performed by: Marinda Elk A Pre-anesthesia Checklist: Patient identified, Timeout performed, Emergency Drugs available, Suction available and Patient being monitored Patient Re-evaluated:Patient Re-evaluated prior to inductionOxygen Delivery Method: Circle system utilized Preoxygenation: Pre-oxygenation with 100% oxygen Intubation Type: IV induction Laryngoscope Size: Mac and 3 Grade View: Grade III Tube type: Oral Tube size: 7.5 mm Number of attempts: 1 Airway Equipment and Method: Stylet Placement Confirmation: ETT inserted through vocal cords under direct vision,  breath sounds checked- equal and bilateral and positive ETCO2 Secured at: 23 cm Tube secured with: Tape Dental Injury: Teeth and Oropharynx as per pre-operative assessment

## 2015-09-23 NOTE — ED Notes (Signed)
Pt ate crackers and drank Sprite given. States nausea is relieved. Pt asking for pain med. Advised pt Dr Ralene Bathe advised no pain meds will be ordered/given. Voiced understanding. Pt given warm blankets x 2 as requested.

## 2015-09-24 ENCOUNTER — Encounter (HOSPITAL_COMMUNITY): Payer: Self-pay

## 2015-09-24 DIAGNOSIS — D638 Anemia in other chronic diseases classified elsewhere: Secondary | ICD-10-CM | POA: Diagnosis present

## 2015-09-24 DIAGNOSIS — D631 Anemia in chronic kidney disease: Secondary | ICD-10-CM | POA: Diagnosis not present

## 2015-09-24 DIAGNOSIS — Z86718 Personal history of other venous thrombosis and embolism: Secondary | ICD-10-CM | POA: Diagnosis not present

## 2015-09-24 DIAGNOSIS — E785 Hyperlipidemia, unspecified: Secondary | ICD-10-CM | POA: Diagnosis present

## 2015-09-24 DIAGNOSIS — Z885 Allergy status to narcotic agent status: Secondary | ICD-10-CM | POA: Diagnosis not present

## 2015-09-24 DIAGNOSIS — Z7952 Long term (current) use of systemic steroids: Secondary | ICD-10-CM | POA: Diagnosis not present

## 2015-09-24 DIAGNOSIS — R0602 Shortness of breath: Secondary | ICD-10-CM | POA: Diagnosis not present

## 2015-09-24 DIAGNOSIS — N186 End stage renal disease: Secondary | ICD-10-CM

## 2015-09-24 DIAGNOSIS — Z888 Allergy status to other drugs, medicaments and biological substances status: Secondary | ICD-10-CM | POA: Diagnosis not present

## 2015-09-24 DIAGNOSIS — Z87891 Personal history of nicotine dependence: Secondary | ICD-10-CM | POA: Diagnosis not present

## 2015-09-24 DIAGNOSIS — E118 Type 2 diabetes mellitus with unspecified complications: Secondary | ICD-10-CM | POA: Diagnosis not present

## 2015-09-24 DIAGNOSIS — I160 Hypertensive urgency: Secondary | ICD-10-CM

## 2015-09-24 DIAGNOSIS — Z8249 Family history of ischemic heart disease and other diseases of the circulatory system: Secondary | ICD-10-CM | POA: Diagnosis not present

## 2015-09-24 DIAGNOSIS — I120 Hypertensive chronic kidney disease with stage 5 chronic kidney disease or end stage renal disease: Secondary | ICD-10-CM | POA: Diagnosis not present

## 2015-09-24 DIAGNOSIS — Z7901 Long term (current) use of anticoagulants: Secondary | ICD-10-CM | POA: Diagnosis not present

## 2015-09-24 DIAGNOSIS — I132 Hypertensive heart and chronic kidney disease with heart failure and with stage 5 chronic kidney disease, or end stage renal disease: Secondary | ICD-10-CM | POA: Diagnosis not present

## 2015-09-24 DIAGNOSIS — F329 Major depressive disorder, single episode, unspecified: Secondary | ICD-10-CM | POA: Diagnosis present

## 2015-09-24 DIAGNOSIS — I480 Paroxysmal atrial fibrillation: Secondary | ICD-10-CM | POA: Diagnosis present

## 2015-09-24 DIAGNOSIS — I2782 Chronic pulmonary embolism: Secondary | ICD-10-CM | POA: Diagnosis not present

## 2015-09-24 DIAGNOSIS — Z992 Dependence on renal dialysis: Secondary | ICD-10-CM

## 2015-09-24 DIAGNOSIS — T8241XA Breakdown (mechanical) of vascular dialysis catheter, initial encounter: Secondary | ICD-10-CM | POA: Diagnosis present

## 2015-09-24 DIAGNOSIS — I5043 Acute on chronic combined systolic (congestive) and diastolic (congestive) heart failure: Secondary | ICD-10-CM | POA: Diagnosis not present

## 2015-09-24 DIAGNOSIS — Z9119 Patient's noncompliance with other medical treatment and regimen: Secondary | ICD-10-CM | POA: Diagnosis not present

## 2015-09-24 DIAGNOSIS — E1122 Type 2 diabetes mellitus with diabetic chronic kidney disease: Secondary | ICD-10-CM | POA: Diagnosis present

## 2015-09-24 DIAGNOSIS — R0782 Intercostal pain: Secondary | ICD-10-CM | POA: Diagnosis not present

## 2015-09-24 DIAGNOSIS — T8612 Kidney transplant failure: Secondary | ICD-10-CM | POA: Diagnosis not present

## 2015-09-24 DIAGNOSIS — Z79899 Other long term (current) drug therapy: Secondary | ICD-10-CM | POA: Diagnosis not present

## 2015-09-24 DIAGNOSIS — Z9114 Patient's other noncompliance with medication regimen: Secondary | ICD-10-CM | POA: Diagnosis not present

## 2015-09-24 LAB — RENAL FUNCTION PANEL
Albumin: 2.8 g/dL — ABNORMAL LOW (ref 3.5–5.0)
Anion gap: 11 (ref 5–15)
BUN: 24 mg/dL — AB (ref 6–20)
CALCIUM: 9 mg/dL (ref 8.9–10.3)
CHLORIDE: 101 mmol/L (ref 101–111)
CO2: 26 mmol/L (ref 22–32)
CREATININE: 5.77 mg/dL — AB (ref 0.61–1.24)
GFR calc Af Amer: 12 mL/min — ABNORMAL LOW (ref >60)
GFR, EST NON AFRICAN AMERICAN: 10 mL/min — AB (ref >60)
Glucose, Bld: 86 mg/dL (ref 65–99)
POTASSIUM: 4.1 mmol/L (ref 3.5–5.1)
Phosphorus: 3.4 mg/dL (ref 2.5–4.6)
Sodium: 138 mmol/L (ref 135–145)

## 2015-09-24 LAB — PROTIME-INR
INR: 1.4 (ref 0.00–1.49)
PROTHROMBIN TIME: 17.3 s — AB (ref 11.6–15.2)

## 2015-09-24 LAB — TROPONIN I
TROPONIN I: 0.06 ng/mL — AB (ref <0.031)
TROPONIN I: 0.06 ng/mL — AB (ref <0.031)

## 2015-09-24 LAB — HEPARIN LEVEL (UNFRACTIONATED)

## 2015-09-24 LAB — BRAIN NATRIURETIC PEPTIDE: B Natriuretic Peptide: >4500 pg/mL — ABNORMAL HIGH (ref 0.0–100.0)

## 2015-09-24 MED ORDER — HEPARIN BOLUS VIA INFUSION
2000.0000 [IU] | Freq: Once | INTRAVENOUS | Status: AC
  Administered 2015-09-24: 2000 [IU] via INTRAVENOUS
  Filled 2015-09-24: qty 2000

## 2015-09-24 MED ORDER — HEPARIN (PORCINE) IN NACL 100-0.45 UNIT/ML-% IJ SOLN
2100.0000 [IU]/h | INTRAMUSCULAR | Status: DC
  Administered 2015-09-24 (×2): 1200 [IU]/h via INTRAVENOUS
  Administered 2015-09-25: 1400 [IU]/h via INTRAVENOUS
  Administered 2015-09-26: 2100 [IU]/h via INTRAVENOUS
  Filled 2015-09-24 (×2): qty 250

## 2015-09-24 MED ORDER — PREDNISONE 5 MG PO TABS: 5.0000 mg | ORAL_TABLET | Freq: Every day | ORAL | Status: DC

## 2015-09-24 MED ORDER — SEVELAMER CARBONATE 800 MG PO TABS
1600.0000 mg | ORAL_TABLET | Freq: Three times a day (TID) | ORAL | Status: DC
  Administered 2015-09-24 – 2015-09-27 (×6): 1600 mg via ORAL
  Filled 2015-09-24 (×7): qty 2

## 2015-09-24 MED ORDER — CARVEDILOL 25 MG PO TABS
25.0000 mg | ORAL_TABLET | Freq: Two times a day (BID) | ORAL | Status: DC
  Administered 2015-09-24 (×2): 25 mg via ORAL
  Filled 2015-09-24: qty 1

## 2015-09-24 MED ORDER — HEPARIN BOLUS VIA INFUSION
3000.0000 [IU] | Freq: Once | INTRAVENOUS | Status: AC
  Administered 2015-09-24: 3000 [IU] via INTRAVENOUS
  Filled 2015-09-24: qty 3000

## 2015-09-24 MED ORDER — WARFARIN SODIUM 3 MG PO TABS
9.0000 mg | ORAL_TABLET | Freq: Once | ORAL | Status: AC
  Administered 2015-09-24: 9 mg via ORAL
  Filled 2015-09-24: qty 3

## 2015-09-24 MED ORDER — SEVELAMER CARBONATE 800 MG PO TABS
1600.0000 mg | ORAL_TABLET | Freq: Three times a day (TID) | ORAL | Status: DC
  Filled 2015-09-24 (×2): qty 2

## 2015-09-24 MED ORDER — PREDNISONE 5 MG PO TABS
5.0000 mg | ORAL_TABLET | Freq: Every day | ORAL | Status: DC
  Administered 2015-09-24 – 2015-09-27 (×4): 5 mg via ORAL
  Filled 2015-09-24 (×4): qty 1

## 2015-09-24 MED ORDER — CARVEDILOL 25 MG PO TABS
25.0000 mg | ORAL_TABLET | Freq: Two times a day (BID) | ORAL | Status: DC
  Administered 2015-09-25: 25 mg via ORAL
  Filled 2015-09-24 (×2): qty 1

## 2015-09-24 MED ORDER — CARVEDILOL 25 MG PO TABS: 25.0000 mg | ORAL_TABLET | Freq: Two times a day (BID) | ORAL | Status: DC

## 2015-09-24 MED ORDER — MENTHOL 3 MG MT LOZG
1.0000 | LOZENGE | OROMUCOSAL | Status: DC | PRN
  Filled 2015-09-24: qty 9

## 2015-09-24 NOTE — Op Note (Signed)
    Patient name: Frank [name] MRN: [id number] DOB: 1965/08/30 Sex: male  09/23/2015 Pre-operative Diagnosis: ESRD Post-operative diagnosis:  Same Surgeon:  Annamarie Major Assistants:  none Procedure:   1.)  Removal of hemodialysis catheter (tunnelled)    2.)  Placement of new tunnelled dialysis catheter Anesthesia:  gen Blood Loss:  See anesthesia record Specimens:  none  Indications:  Pt with hole in existing catheter.  Needs replacement  Procedure:  The patient was identified in the holding area and taken to [place] 11  The patient was then placed supine on the table. general anesthesia was administered.  The patient was prepped and draped in the usual sterile fashion.  A time out was called and antibiotics were administered.  A 11 blade was used to make a nick over the catheter in the neck.  The catheter was dissected free, and clamped with a hemostat.  Next the cuff at the skin exit site was exposed.  Once the catheter was fully mobile, the portion attached to the cuff was cut, and the tubing brought into the neck incision.  A benson wire was threaded into the catheter which was then removed.  A peel-a-way sheath was placed.  A 23 cm Pallindrome catheter was inserted and the sheath removed.  A new skin exit site was selected.  A tunnel was created and the catheter brought through the tunnel.  It was connected to the port.  Both ports flushed and aspirated without difficulty.  Flouro confirmed that the tip was in good position and that there were no kinks.  The catheter was secured with 2-0 nylon, and the neck incision closed with 4-0 vicryl.  Sterile dressing was applied, and the catheter was filled with the appropriate volumes of heparin.   Disposition:  To PACU in stable condition.   Theotis Burrow, M.D. [facility] [place] Office: [phone] Pager:  [phone]

## 2015-09-24 NOTE — Progress Notes (Signed)
Bp elevated earlier, given Hydralazine at 0900 with bp slightly lower. Pt unable to take routine meds earlier due to nausea. Will continue to monitor

## 2015-09-24 NOTE — Progress Notes (Addendum)
[facility] PROGRESS NOTE    Progress Note   Frank [name] TKP:[id number] DOB: February 12, 1966 DOA: 09/23/2015 PCP: [name] Patient   Brief Narrative:   Frank [name] is an 50 y.o. male past medical history of end-stage renal disease on dialysis Thursday status is at _0 /04/17 2317    Code Status History    Date Active Date Inactive Code Status Order ID Comments User Context   09/08/2015  4:07 AM 09/11/2015  5:40 PM Full Code [id number]  [name], MD Inpatient   08/18/2015  6:06 PM 08/20/2015  8:49 PM Full Code [id number]  [name], MD Inpatient   06/19/2015 12:04 PM 06/19/2015  8:49 PM Full Code [id number]  [name], MD Inpatient   02/11/2015  2:09 AM 02/16/2015 10:07 PM Full Code [id number]  [name], MD ED   01/31/2015  9:12 PM 02/04/2015  4:50 PM Full Code [id number]  [name], MD Inpatient   01/02/2015  4:29 PM 01/03/2015  5:33 PM Full Code [id number]  [name], MD Inpatient   11/29/2014  3:19 AM 12/05/2014  5:30 PM Full Code [id number]  [name], MD ED   11/17/2014 10:06 AM 11/19/2014 12:17 PM Full Code [id number]  [name], MD ED   10/07/2014  4:29 PM 10/10/2014  5:48 PM Full Code [id number]  [name], MD Inpatient   09/30/2014 11:42 PM 10/02/2014  7:14 PM Full Code [id number]  [name], DO ED   09/02/2014  3:16 PM 09/02/2014 10:15 PM Full Code [id number]  [name], MD Inpatient   08/30/2014  6:29 PM 09/02/2014  3:16 PM Full Code [id number]  [name], NP ED  05/08/2014  9:04 AM 05/13/2014  5:50 PM Full Code [id number]  [name], MD ED   04/05/2014  7:16 PM 04/06/2014  3:04 PM Full Code [id number]  [name], MD Inpatient   03/27/2014  9:43 AM 03/27/2014  9:38 PM Full Code [id number]  [name], MD Inpatient   01/19/2014  5:35 AM 01/19/2014  8:19 PM Full Code [id number]  [name], MD Inpatient   12/12/2013  8:07 PM 12/13/2013  7:42 PM Full Code [id number]  [name], MD Inpatient   10/17/2013  4:15 PM 10/18/2013  4:39 PM Full Code [id number]  [name], MD Inpatient   09/16/2013  8:55 PM  09/20/2013  7:20 PM Full Code [id number]  [name], NP Inpatient   09/12/2013  2:48 AM 09/12/2013 10:56 PM Full Code [id number]  [name], MD Inpatient   06/24/2013  1:27 PM 06/24/2013  7:16 PM Full Code [id number]  [name], MD Inpatient        IV Access:    Peripheral IV   Procedures and diagnostic studies:   Dg Chest 2 View  09/23/2015  CLINICAL DATA:  Chest pain and shortness of breath, dialysis patient EXAM: CHEST  2 VIEW COMPARISON:  09/21/2015 FINDINGS: Dialysis catheter unchanged with tip into the right atrium. Severe cardiac silhouette enlargement. Central vascular congestion similar to prior study. No pulmonary edema or pleural effusion. IMPRESSION: Cardiomegaly with central vascular congestion similar to prior studies with no acute findings Electronically Signed   By: Skipper Cliche M.D.   On: 09/23/2015 07:55   Dg Chest Port 1v Same Day  09/23/2015  CLINICAL DATA:  Encounter for central line placement EXAM: PORTABLE CHEST 1 VIEW COMPARISON:  09/23/2015 FINDINGS: There is a right chest wall dialysis catheter with tips in the cavoatrial junction. No pneumothorax identified. Cardiac enlargement and pulmonary vascular congestion noted. IMPRESSION: 1. No complication after right chest wall dialysis catheter placement with tips in the cavoatrial junction. Electronically Signed   By: Kerby Moors M.D.   On: 09/23/2015 18:46   Dg Fluoro Guide Cv Line-no Report  09/23/2015  CLINICAL DATA:  FLOURO GUIDE CV LINE Fluoroscopy was utilized by the requesting physician.  No radiographic interpretation.     Medical Consultants:    None.  Anti-Infectives:   Anti-infectives    Start     Dose/Rate Route Frequency Ordered Stop   09/23/15 1630  vancomycin (VANCOCIN) 1 GM/200ML IVPB    Comments:  Marinda Elk   : cabinet override      09/23/15 1630 09/23/15 1737      Subjective:    [name] complaining of cough and difficulty swallowing.  Objective:    Filed  Vitals:   09/23/15 2230 09/23/15 2300 09/23/15 2330 09/24/15 0528  BP: 184/129 178/122 183/110 173/122  Pulse: 84 83 84 90  Temp:   97.8 F (36.6 C) 98.3 F (36.8 C)  TempSrc:   Oral Oral  Resp: _0 Weight:   74.98 kg (165 lb 4.8 oz) 74.8 kg (164 lb 14.5 oz)  SpO2:  97% 98% 93%    Intake/Output Summary (Last 24 hours) at 09/24/15 0728 Last data filed at 09/24/15 0600  Gross per 24 hour  Intake    240 ml  Output   4020 ml  Net  -3780 ml   Filed Weights   09/23/15 2330 09/24/15 0528  Weight: 74.98 kg (165 lb 4.8 oz) 74.8 kg (164 lb 14.5  oz)    Exam: Gen:  NAD Cardiovascular:  RRR.+JVD Chest and lungs:   CTAB Abdomen:  Abdomen soft, NT/ND, + BS Extremities:  No edema   Data Reviewed:    Labs: Basic Metabolic Panel:  Recent Labs Lab 09/18/15 1400 09/19/15 2207 09/21/15 0736 09/23/15 1240 09/24/15 0324  NA 137 137 137 140 138  K 4.5 4.8 4.9 5.1 4.1  CL 100* 101 101 99* 101  CO2 23 26 19* 28 26  GLUCOSE 107* 158* 143* 138* 86  BUN 68* 49* 76* 55* 24*  CREATININE 12.42* 11.21* 13.26* 9.55* 5.77*  CALCIUM 8.4* 8.3* 8.2* 8.6* 9.0  PHOS 5.5*  --   --  4.2 3.4   GFR Estimated Creatinine Clearance: 16 mL/min (by C-G formula based on Cr of 5.77). Liver Function Tests:  Recent Labs Lab 09/18/15 0700 09/18/15 1400 09/23/15 1240 09/24/15 0324  AST 50*  --   --   --   ALT 19  --   --   --   ALKPHOS 127*  --   --   --   BILITOT 0.5  --   --   --   PROT 7.3  --   --   --   ALBUMIN 3.0* 2.8* 2.8* 2.8*   No results for input(s): LIPASE, AMYLASE in the last 168 hours. No results for input(s): AMMONIA in the last 168 hours. Coagulation profile  Recent Labs Lab 09/18/15 0700 09/23/15 1655  INR 1.83* 1.32    CBC:  Recent Labs Lab 09/18/15 0700 09/18/15 1400 09/19/15 2207 09/21/15 0736 09/23/15 1240  WBC 6.6 5.6 4.7 7.1 6.3  NEUTROABS  --   --   --  5.9  --   HGB 7.8* 7.7* 6.8* 7.3* 7.4*  HCT 24.7* 23.3* 21.1* 21.8* 23.8*  MCV 86.7 86.9  85.4 87.2 88.5  PLT 184 155 160 142* 173   Cardiac Enzymes:  Recent Labs Lab 09/24/15 0324  TROPONINI 0.06*   BNP (last 3 results) No results for input(s): PROBNP in the last 8760 hours. CBG: No results for input(s): GLUCAP in the last 168 hours. D-Dimer: No results for input(s): DDIMER in the last 72 hours. Hgb A1c: No results for input(s): HGBA1C in the last 72 hours. Lipid Profile: No results for input(s): CHOL, HDL, LDLCALC, TRIG, CHOLHDL, LDLDIRECT in the last 72 hours. Thyroid function studies: No results for input(s): TSH, T4TOTAL, T3FREE, THYROIDAB in the last 72 hours.  Invalid input(s): FREET3 Anemia work up: No results for input(s): VITAMINB12, FOLATE, FERRITIN, TIBC, IRON, RETICCTPCT in the last 72 hours. Sepsis Labs:  Recent Labs Lab 09/18/15 1400 09/19/15 2207 09/21/15 0736 09/23/15 1240  WBC 5.6 4.7 7.1 6.3   Microbiology No results found for this or any previous visit (from the past 240 hour(s)).   Medications:   . amLODipine  10 mg Oral Daily  . atorvastatin  40 mg Oral q1800  . carvedilol  25 mg Oral BID WC  . cinacalcet  30 mg Oral Daily  . docusate sodium  100 mg Oral BID  . gabapentin  100 mg Oral TID  . hydrALAZINE  100 mg Oral 3 times per day  . isosorbide mononitrate  90 mg Oral Daily  . lisinopril  20 mg Oral Daily  . multivitamin  1 tablet Oral QHS  . pantoprazole  40 mg Oral Daily  . predniSONE  5 mg Oral Q breakfast  . sevelamer carbonate  1,600 mg Oral TID WC  . sodium chloride flush  3 mL Intravenous Q12H  . Warfarin - Pharmacist Dosing Inpatient   Does not apply q1800   Continuous Infusions:   Time spent: 15 min     [name]  [facility] Pager [phone]  *Please refer to [email or web address], password [email or web address] to get updated schedule on who will round on this patient, as hospitalists switch teams weekly. If 7PM-7AM, please contact night-coverage at [email or web address], password [email or web address] for any overnight needs.  09/24/2015,  7:28 AM

## 2015-09-24 NOTE — Progress Notes (Addendum)
ANTICOAGULATION CONSULT NOTE - Follow Up Consult  Pharmacy Consult for warfarin Indication: Hx of PE.  Allergies  Allergen Reactions  . Butalbital-Apap-Caffeine Shortness Of Breath and Swelling    Swelling in throat  . Ferrlecit [Na Ferric Gluc Cplx In Sucrose] Shortness Of Breath, Swelling and Other (See Comments)    Swelling in throat  . Minoxidil Shortness Of Breath  . Darvocet [Propoxyphene N-Acetaminophen] Hives  . Propoxyphene Hives    Allergy listed on file from [facility] (Pt did not mention any allergies)     Patient Measurements: Weight: 164 lb 14.5 oz (74.8 kg)  Vital Signs: Temp: 98.3 F (36.8 C) (03/05 0528) Temp Source: Oral (03/05 0528) BP: 173/122 mmHg (03/05 0528) Pulse Rate: 90 (03/05 0528)  Labs:  Recent Labs  09/23/15 1240 09/23/15 1655 09/24/15 0324  HGB 7.4*  --   --   HCT 23.8*  --   --   PLT 173  --   --   APTT  --  26  --   LABPROT  --  16.5*  --   INR  --  1.32  --   CREATININE 9.55*  --  5.77*  TROPONINI  --   --  0.06*    Estimated Creatinine Clearance: 16 mL/min (by C-G formula based on Cr of 5.77).   Assessment: 50 yo M admitted 09/23/2015 on coumadin PTA for hx of PE. Pharmacy consulted to manage coumadin. INR on admission was low at 1.32 (?compliance)  INR 1.40, Hgb 7.4 (hx of anemia of CKD), Plt wnl. No s/sx of bleeding noted since HD cath replaced.  Coumadin PTA Dose: 9 mg daily  Goal of Therapy:  INR 2-3 Monitor platelets by anticoagulation protocol: Yes   Plan:  - Give warfarin 9 mg x1 tonight - Monitor daily INR, CBC and s/sx of bleeding - F/u need for bridge therapy while patient's INR remains subtherapeutic  Dimitri Ped, PharmD. PGY-1 Pharmacy Resident Pager: [phone] 09/24/2015,11:55 AM   Pharmacy consulted to start heparin bridge for PE treatment.  - Bolus heparin 3000 units x1 - Start heparin at 1200 units/hr - Monitor 8hr HL - Monitor daily HL, CBC and s/sx of bleeding - Continue warfarin  Dimitri Ped, PharmD. PGY-1 Pharmacy Resident Pager: [phone] 09/24/2015,12:30 PM

## 2015-09-24 NOTE — Progress Notes (Signed)
ANTICOAGULATION CONSULT NOTE - Follow Up Consult  Pharmacy Consult for Heparin Indication: Hx of PE.  Allergies  Allergen Reactions  . Butalbital-Apap-Caffeine Shortness Of Breath and Swelling    Swelling in throat  . Ferrlecit [Na Ferric Gluc Cplx In Sucrose] Shortness Of Breath, Swelling and Other (See Comments)    Swelling in throat  . Minoxidil Shortness Of Breath  . Darvocet [Propoxyphene N-Acetaminophen] Hives  . Propoxyphene Hives    Allergy listed on file from [facility] (Pt did not mention any allergies)     Patient Measurements: Height: _0  (188 cm) Weight: 164 lb 14.5 oz (74.8 kg) IBW/kg (Calculated) : 82.2  Vital Signs: Temp: 98.3 F (36.8 C) (03/05 2001) Temp Source: Oral (03/05 2001) BP: 152/87 mmHg (03/05 2001) Pulse Rate: 86 (03/05 2001)  Labs:  Recent Labs  09/23/15 1240 09/23/15 1655 09/24/15 0324 09/24/15 1033 09/24/15 2100  HGB 7.4*  --   --   --   --   HCT 23.8*  --   --   --   --   PLT 173  --   --   --   --   APTT  --  26  --   --   --   LABPROT  --  16.5*  --  17.3*  --   INR  --  1.32  --  1.40  --   HEPARINUNFRC  --   --   --   --  <0.10*  CREATININE 9.55*  --  5.77*  --   --   TROPONINI  --   --  0.06* 0.06*  --     Estimated Creatinine Clearance: 16 mL/min (by C-G formula based on Cr of 5.77).   Assessment: 50 yo M admitted 09/23/2015 on coumadin PTA for hx of PE. Pharmacy consulted to manage coumadin. INR on admission was low at 1.32 (?compliance)  Bridging with heparin, initial heparin level low at < 0.10   Goal of Therapy:  INR 2-3 Monitor platelets by anticoagulation protocol: Yes  Heparin level = 0.3-0.7   Plan:  Heparin 2000 units iv bolus x 1 Heparin drip to 1400 units / hr 8 hour level  Thank you Anette Guarneri, PharmD [phone]  09/24/2015,10:17 PM

## 2015-09-25 ENCOUNTER — Encounter (HOSPITAL_COMMUNITY): Payer: Self-pay | Admitting: Surgery

## 2015-09-25 LAB — GLUCOSE, CAPILLARY
GLUCOSE-CAPILLARY: 79 mg/dL (ref 65–99)
Glucose-Capillary: 55 mg/dL — ABNORMAL LOW (ref 65–99)
Glucose-Capillary: 71 mg/dL (ref 65–99)
Glucose-Capillary: 78 mg/dL (ref 65–99)
Glucose-Capillary: 92 mg/dL (ref 65–99)

## 2015-09-25 LAB — CBC
HEMATOCRIT: 21.3 % — AB (ref 39.0–52.0)
HEMOGLOBIN: 6.9 g/dL — AB (ref 13.0–17.0)
MCH: 28.5 pg (ref 26.0–34.0)
MCHC: 32.4 g/dL (ref 30.0–36.0)
MCV: 88 fL (ref 78.0–100.0)
Platelets: 155 10*3/uL (ref 150–400)
RBC: 2.42 MIL/uL — ABNORMAL LOW (ref 4.22–5.81)
RDW: 15.4 % (ref 11.5–15.5)
WBC: 5.9 10*3/uL (ref 4.0–10.5)

## 2015-09-25 LAB — HEPARIN LEVEL (UNFRACTIONATED): Heparin Unfractionated: <0.1 IU/mL — ABNORMAL LOW (ref 0.30–0.70)

## 2015-09-25 LAB — PROTIME-INR
INR: 1.68 — AB (ref 0.00–1.49)
Prothrombin Time: 19.8 seconds — ABNORMAL HIGH (ref 11.6–15.2)

## 2015-09-25 LAB — HEMOGLOBIN AND HEMATOCRIT, BLOOD
HEMATOCRIT: 24.9 % — AB (ref 39.0–52.0)
HEMOGLOBIN: 8 g/dL — AB (ref 13.0–17.0)

## 2015-09-25 MED ORDER — WARFARIN SODIUM 3 MG PO TABS
9.0000 mg | ORAL_TABLET | Freq: Once | ORAL | Status: AC
Start: 2015-09-25 — End: 2015-09-25
  Administered 2015-09-25: 9 mg via ORAL
  Filled 2015-09-25: qty 3

## 2015-09-25 MED ORDER — HEPARIN BOLUS VIA INFUSION
2000.0000 [IU] | Freq: Once | INTRAVENOUS | Status: AC
  Administered 2015-09-25: 2000 [IU] via INTRAVENOUS
  Filled 2015-09-25: qty 2000

## 2015-09-25 MED ORDER — ATROPINE SULFATE 0.1 MG/ML IJ SOLN
INTRAMUSCULAR | Status: AC
  Filled 2015-09-25: qty 10

## 2015-09-25 MED ORDER — GLUCAGON HCL RDNA (DIAGNOSTIC) 1 MG IJ SOLR
5.0000 mg | Freq: Once | INTRAVENOUS | Status: DC
  Filled 2015-09-25: qty 5

## 2015-09-25 MED ORDER — ATROPINE SULFATE 0.4 MG/ML IJ SOLN
0.4000 mg | Freq: Once | INTRAMUSCULAR | Status: AC
  Administered 2015-09-25: 0.4 mg via INTRAVENOUS
  Filled 2015-09-25: qty 1

## 2015-09-25 MED ORDER — ATROPINE SULFATE 0.1 MG/ML IJ SOLN
INTRAMUSCULAR | Status: AC
  Administered 2015-09-25: 0.4 mg
  Filled 2015-09-25: qty 10

## 2015-09-25 MED ORDER — GLUCAGON HCL RDNA (DIAGNOSTIC) 1 MG IJ SOLR
INTRAMUSCULAR | Status: AC
  Administered 2015-09-25: 3 mg
  Filled 2015-09-25: qty 1

## 2015-09-25 MED ORDER — SODIUM CHLORIDE 0.9 % IV BOLUS (SEPSIS)
250.0000 mL | Freq: Once | INTRAVENOUS | Status: AC
  Administered 2015-09-25: 250 mL via INTRAVENOUS

## 2015-09-25 NOTE — Progress Notes (Signed)
BP 152/99 mmHg  Pulse 55  Temp(Src) 97.5 F (36.4 C) (Oral)  Resp 18  Ht _0  (1.88 m)  Wt 74.8 kg (164 lb 14.5 oz)  BMI 21.16 kg/m2  SpO2 100% Was called by the nurse as he cont to have extreme bradycardia. Below 30, will give him atropine, placed pad and transfer to stepdown. Pt relates he feels tired and weak. Will discuss with cards if he may benefit from a glucagon ggt.

## 2015-09-25 NOTE — Progress Notes (Signed)
ANTICOAGULATION CONSULT NOTE - Follow Up Consult  Pharmacy Consult for Heparin Indication: Hx of PE.  Allergies  Allergen Reactions  . Butalbital-Apap-Caffeine Shortness Of Breath and Swelling    Swelling in throat  . Ferrlecit [Na Ferric Gluc Cplx In Sucrose] Shortness Of Breath, Swelling and Other (See Comments)    Swelling in throat  . Minoxidil Shortness Of Breath  . Darvocet [Propoxyphene N-Acetaminophen] Hives  . Propoxyphene Hives    Allergy listed on file from [facility] (Pt did not mention any allergies)     Patient Measurements: Height: 6' 2" (188 cm) Weight:  (pt refused) IBW/kg (Calculated) : 82.2  Dosing weight = 75 kg  Vital Signs: Temp: 97.3 F (36.3 C) (03/06 2000) Temp Source: Oral (03/06 2000) BP: 159/117 mmHg (03/06 2000) Pulse Rate: 64 (03/06 2000)  Labs:  Recent Labs  09/23/15 1240 09/23/15 1655 09/24/15 0324 09/24/15 1033 09/24/15 2100 09/25/15 0325 09/25/15 0941 09/25/15 1916  HGB 7.4*  --   --   --   --  6.9*  --   --   HCT 23.8*  --   --   --   --  21.3*  --   --   PLT 173  --   --   --   --  155  --   --   APTT  --  26  --   --   --   --   --   --   LABPROT  --  16.5*  --  17.3*  --  19.8*  --   --   INR  --  1.32  --  1.40  --  1.68*  --   --   HEPARINUNFRC  --   --   --   --  <0.10*  --  <0.10* <0.10*  CREATININE 9.55*  --  5.77*  --   --   --   --   --   TROPONINI  --   --  0.06* 0.06*  --   --   --   --     Estimated Creatinine Clearance: 16 mL/min (by C-G formula based on Cr of 5.77).   Assessment: 50 yo M admitted 09/23/2015 on coumadin PTA for hx of PE. Pharmacy consulted to manage coumadin. INR subtherapeutic at 1.68, Bridging with heparin.   Heparin level this evening remains undetectable (HL <0.1, goal of 0.3-0.7). Hgb was down to 6.9 this AM from 7.4 - it appears as if the MD wanted the patient to receive a unit with HD however the patient has not received HD today nor are there plans for HD today. The patient noted to be  symptomatic earlier this evening with symptoms of "dizzy, weak, and tired"  Contacted [facility] on-call NP Baltazar Najjar) to see if the patient should receive a unit of PRBC this evening - since the patient did not go to HD and receive it there. She has ordered repeat Hgb/Hct this evening and will transfuse if needed based on these results. She is okay with continuing the heparin drip as there are no signs/symptoms of active bleeding at this time.  Goal of Therapy:  Heparin level 0.3-0.7 units/ml Monitor platelets by anticoagulation protocol: Yes    Plan:  1. Hold heparin bolus until Hgb more stable 2. Increase heparin to 2100 units/hr (21 ml/hr) 3. Will continue to monitor for any signs/symptoms of bleeding and will follow up with heparin level in 8 hours   Alycia Rossetti, PharmD, BCPS Clinical Pharmacist  Pager: [phone] 09/25/2015 9:29 PM

## 2015-09-25 NOTE — Progress Notes (Signed)
[place] Progress Note Patient Name: Frank [name] DOB: 07-16-1966 MRN: [id number]   Date of Service  09/25/2015  HPI/Events of Note  Camera Check on Patient after Transfer to the Intensive Care Unit. Patient Status Post Atropine IV 2.  Also Received Glucagon IV 1. Patient Now Resting Comfortably. Request That Staff Leave Him Alone. Discussed Briefly with Bedside Nurse. Patient Now in Normal Sinus Rhythm.  eICU Interventions  Plan for continued close monitoring. Continuing to hold Coreg.  Plan for formal PCCM consult if patient decompensates.     Intervention Category Evaluation Type: New Patient Evaluation  Tera Partridge 09/25/2015, 7:44 PM

## 2015-09-25 NOTE — Progress Notes (Signed)
Report given to [facility] RN. Pt stable and will transfer.

## 2015-09-25 NOTE — Progress Notes (Addendum)
[facility] PROGRESS NOTE    Progress Note   Frank [name] BTY:[id number] DOB: Oct 28, 1965 DOA: 09/23/2015 PCP: [name] Patient   Brief Narrative:   Frank [name] is an 50 y.o. male past medical history of end-stage renal disease on dialysis Thursday status is at _0 /04/17 2317    Code Status History    Date Active Date Inactive Code Status Order ID Comments User Context   09/08/2015  4:07 AM 09/11/2015  5:40 PM Full Code [id number]  [name], MD Inpatient   08/18/2015  6:06 PM 08/20/2015  8:49 PM Full Code [id number]  [name], MD Inpatient   06/19/2015 12:04 PM 06/19/2015  8:49 PM Full Code [id number]  [name], MD Inpatient   02/11/2015  2:09 AM 02/16/2015 10:07 PM Full Code [id number]  [name], MD ED   01/31/2015  9:12 PM 02/04/2015  4:50 PM Full Code [id number]  [name], MD Inpatient   01/02/2015  4:29 PM 01/03/2015  5:33 PM Full Code [id number]  [name], MD Inpatient   11/29/2014  3:19 AM 12/05/2014  5:30 PM Full Code [id number]  [name], MD ED   11/17/2014 10:06 AM 11/19/2014 12:17 PM Full Code [id number]  [name], MD ED   10/07/2014  4:29 PM 10/10/2014  5:48 PM Full Code [id number]  [name], MD Inpatient   09/30/2014 11:42 PM 10/02/2014  7:14 PM Full Code [id number]  [name], DO ED   09/02/2014  3:16 PM 09/02/2014 10:15 PM Full Code [id number]  [name], MD Inpatient   08/30/2014  6:29 PM 09/02/2014  3:16 PM Full Code [id number]  Leisa Lenz  Lissa Merlin, NP ED   05/08/2014  9:04 AM 05/13/2014  5:50 PM Full Code [id number]  [name], MD ED   04/05/2014  7:16 PM 04/06/2014  3:04 PM Full Code [id number]  [name], MD Inpatient   03/27/2014  9:43 AM 03/27/2014  9:38 PM Full Code [id number]  [name], MD Inpatient   01/19/2014  5:35 AM 01/19/2014  8:19 PM Full Code [id number]  [name], MD Inpatient   12/12/2013  8:07 PM 12/13/2013  7:42 PM Full Code [id number]  [name], MD Inpatient   10/17/2013  4:15 PM 10/18/2013  4:39 PM Full Code [id number]  [name], MD Inpatient   09/16/2013  8:55 PM  09/20/2013  7:20 PM Full Code [id number]  [name], NP Inpatient   09/12/2013  2:48 AM 09/12/2013 10:56 PM Full Code [id number]  [name], MD Inpatient   06/24/2013  1:27 PM 06/24/2013  7:16 PM Full Code [id number]  [name], MD Inpatient        IV Access:    Peripheral IV   Procedures and diagnostic studies:   Dg Chest Port 1v Same Day  09/23/2015  CLINICAL DATA:  Encounter for central line placement EXAM: PORTABLE CHEST 1 VIEW COMPARISON:  09/23/2015 FINDINGS: There is a right chest wall dialysis catheter with tips in the cavoatrial junction. No pneumothorax identified. Cardiac enlargement and pulmonary vascular congestion noted. IMPRESSION: 1. No complication after right chest wall dialysis catheter placement with tips in the cavoatrial junction. Electronically Signed   By: Kerby Moors M.D.   On: 09/23/2015 18:46   Dg Fluoro Guide Cv Line-no Report  09/23/2015  CLINICAL DATA:  FLOURO GUIDE CV LINE Fluoroscopy was utilized by the requesting physician.  No radiographic interpretation.     Medical Consultants:    None.  Anti-Infectives:   Anti-infectives    Start     Dose/Rate Route Frequency Ordered Stop   09/23/15 1630  vancomycin (VANCOCIN) 1 GM/200ML IVPB    Comments:  Marinda Elk   : cabinet override      09/23/15 1630 09/23/15 1737      Subjective:    [name] no complains  Objective:    Filed Vitals:   09/24/15 1512 09/24/15 1538 09/24/15 2001 09/25/15 0352  BP: 156/104  152/87 146/99  Pulse:   86 82  Temp:   98.3 F (36.8 C)   TempSrc:   Oral   Resp:   16 16  Height:  _0  (1.88 m)    Weight:      SpO2:   96% 98%    Intake/Output Summary (Last 24 hours) at 09/25/15 1111 Last data filed at 09/25/15 0934  Gross per 24 hour  Intake 690.73 ml  Output      0 ml  Net 690.73 ml   Filed Weights   09/23/15 2330 09/24/15 0528  Weight: 74.98 kg (165 lb 4.8 oz) 74.8 kg (164 lb 14.5 oz)    Exam: Gen:  NAD Cardiovascular:   RRR.+JVD Chest and lungs:   Good Air movement and clear auscultation. Abdomen:  Abdomen soft, NT/ND, + BS Extremities:  No edema   Data Reviewed:    Labs: Basic Metabolic Panel:  Recent Labs Lab 09/18/15 1400 09/19/15 2207 09/21/15 0736 09/23/15 1240 09/24/15 0324  NA 137 137 137 140 138  K 4.5 4.8 4.9 5.1 4.1  CL 100* 101 101 99* 101  CO2 23 26 19* 28 26  GLUCOSE 107* 158* 143* 138* 86  BUN 68* 49* 76* 55* 24*  CREATININE 12.42* 11.21* 13.26* 9.55* 5.77*  CALCIUM 8.4* 8.3* 8.2* 8.6* 9.0  PHOS 5.5*  --   --  4.2 3.4   GFR Estimated Creatinine Clearance: 16 mL/min (by C-G formula based on Cr of 5.77). Liver Function Tests:  Recent Labs Lab 09/18/15 1400 09/23/15 1240 09/24/15 0324  ALBUMIN 2.8* 2.8* 2.8*   No results for input(s): LIPASE, AMYLASE in the last 168 hours. No results for input(s): AMMONIA in the last 168 hours. Coagulation profile  Recent Labs Lab 09/23/15 1655 09/24/15 1033 09/25/15 0325  INR 1.32 1.40 1.68*    CBC:  Recent Labs Lab 09/18/15 1400 09/19/15 2207 09/21/15 0736 09/23/15 1240 09/25/15 0325  WBC 5.6 4.7 7.1 6.3 5.9  NEUTROABS  --   --  5.9  --   --   HGB 7.7* 6.8* 7.3* 7.4* 6.9*  HCT 23.3* 21.1* 21.8* 23.8* 21.3*  MCV 86.9 85.4 87.2 88.5 88.0  PLT 155 160 142* 173 155   Cardiac Enzymes:  Recent Labs Lab 09/24/15 0324 09/24/15 1033  TROPONINI 0.06* 0.06*   BNP (last 3 results) No results for input(s): PROBNP in the last 8760 hours. CBG: No results for input(s): GLUCAP in the last 168 hours. D-Dimer: No results for input(s): DDIMER in the last 72 hours. Hgb A1c: No results for input(s): HGBA1C in the last 72 hours. Lipid Profile: No results for input(s): CHOL, HDL, LDLCALC, TRIG, CHOLHDL, LDLDIRECT in the last 72 hours. Thyroid function studies: No results for input(s): TSH, T4TOTAL, T3FREE, THYROIDAB in the last 72 hours.  Invalid input(s): FREET3 Anemia work up: No results for input(s): VITAMINB12,  FOLATE, FERRITIN, TIBC, IRON, RETICCTPCT in the last 72 hours. Sepsis Labs:  Recent Labs Lab 09/19/15 2207 09/21/15 0736 09/23/15 1240 09/25/15 0325  WBC 4.7 7.1 6.3 5.9   Microbiology No results found for this or any previous visit (from the past 240 hour(s)).   Medications:   . amLODipine  10 mg Oral Daily  . atorvastatin  40 mg Oral q1800  . carvedilol  25 mg Oral BID WC  . cinacalcet  30 mg Oral Daily  . docusate sodium  100 mg Oral BID  . gabapentin  100 mg Oral TID  . heparin  2,000 Units Intravenous Once  . hydrALAZINE  100 mg Oral 3 times per day  . isosorbide mononitrate  90 mg Oral Daily  . lisinopril  20 mg Oral Daily  . multivitamin  1 tablet Oral QHS  . pantoprazole  40 mg Oral Daily  . predniSONE  5 mg Oral Q breakfast  . sevelamer carbonate  1,600 mg Oral TID WC  . sevelamer carbonate  1,600 mg Oral TID WC  . sodium chloride flush  3 mL Intravenous Q12H  . warfarin  9 mg Oral ONCE-1800  . Warfarin - Pharmacist Dosing Inpatient   Does not apply q1800   Continuous Infusions: . heparin 1,400 Units/hr (09/25/15 0721)    Time spent: 15 min   LOS: 1 day   [name]  [facility] Pager [phone]  *Please refer to [email or web address], password [email or web address] to get updated schedule on who will round on this patient, as hospitalists switch teams weekly. If 7PM-7AM, please contact night-coverage at [email or web address], password [email or web address] for any overnight needs.  09/25/2015, 11:11 AM

## 2015-09-25 NOTE — Progress Notes (Signed)
ANTICOAGULATION CONSULT NOTE - Follow Up Consult  Pharmacy Consult for Heparin Indication: Hx of PE.  Allergies  Allergen Reactions  . Butalbital-Apap-Caffeine Shortness Of Breath and Swelling    Swelling in throat  . Ferrlecit [Na Ferric Gluc Cplx In Sucrose] Shortness Of Breath, Swelling and Other (See Comments)    Swelling in throat  . Minoxidil Shortness Of Breath  . Darvocet [Propoxyphene N-Acetaminophen] Hives  . Propoxyphene Hives    Allergy listed on file from [place] (Pt did not mention any allergies)     Patient Measurements: Height: _0  (188 cm) Weight:  (pt refused) IBW/kg (Calculated) : 82.2  Dosing weight = 75 kg  Vital Signs: BP: 146/99 mmHg (03/06 0352) Pulse Rate: 82 (03/06 0352)  Labs:  Recent Labs  09/23/15 1240 09/23/15 1655 09/24/15 0324 09/24/15 1033 09/24/15 2100 09/25/15 0325 09/25/15 0941  HGB 7.4*  --   --   --   --  6.9*  --   HCT 23.8*  --   --   --   --  21.3*  --   PLT 173  --   --   --   --  155  --   APTT  --  26  --   --   --   --   --   LABPROT  --  16.5*  --  17.3*  --  19.8*  --   INR  --  1.32  --  1.40  --  1.68*  --   HEPARINUNFRC  --   --   --   --  <0.10*  --  <0.10*  CREATININE 9.55*  --  5.77*  --   --   --   --   TROPONINI  --   --  0.06* 0.06*  --   --   --     Estimated Creatinine Clearance: 16 mL/min (by C-G formula based on Cr of 5.77).   Assessment: 50 yo M admitted 09/23/2015 on coumadin PTA for hx of PE. Pharmacy consulted to manage coumadin. INR subtherapeutic at 1.68, Bridging with heparin, heparin level undetectable on 1400 units/hr, required much higher heparin dose during previous admissions. hgb 6.9, low but stable from yesterday. plt 155K  Coumadin PTA dose: 9 mg daily   Goal of Therapy:  INR 2-3 Monitor platelets by anticoagulation protocol: Yes  Heparin level = 0.3-0.7   Plan:  Coumadin 9 mg po x 1 Heparin 2000 units iv bolus x 1 Heparin drip to 1800 units / hr 8 hour heparin level at  [place], PharmD, [place] Pharmacist  Pager: [phone]   09/25/2015,10:53 AM

## 2015-09-25 NOTE — Progress Notes (Signed)
Transferred -in from [place] by bed, refused the blood pressure cuff to be applied, and chg wipes,  wants to take a rest since he was nauseous early and was given zofran. Zoll at bedside.

## 2015-09-25 NOTE — Significant Event (Signed)
Rapid Response Event Note  Overview: Time Called: 1756 Arrival Time: 1800 Event Type: Cardiac  Initial Focused Assessment:  Called by primary RN for patient with persistent bradycardia, now 25-30's.  Upon my arrival to [place] room, RN and MD at bedside.  Patient lying flat, BP 138/86.  Patient states he feels dizzy, weak and tired.     Interventions:   Patient  Given atropine prior to my arrival.  Pads placed on patient.  Patient received 3 mg IV glucagon, HR now at 70's.  Patient developed some vomiting and nausea after glucagon.  Event Summary:  Patient to transfer to 2 H   at      at          [facility], Harlin Rain

## 2015-09-25 NOTE — Progress Notes (Signed)
Hypoglycemic Event  CBG: 55  Treatment: 15 GM carbohydrate snack  Symptoms: Sweaty  Follow-up CBG: Time:1455 CBG Result:71  Possible Reasons for Event: Unknown  Comments/MD notified:     Mar Daring

## 2015-09-25 NOTE — Progress Notes (Signed)
Patient's HR has dropped to the 30's. Upon entering the room, the patient is seen sitting on the side of the bed and stating he is nauseated, dizzy, has chest pain, and SOB. Vitals completed. Rapid response and physician notified. 250 bolus ordered. EKG completed.

## 2015-09-25 NOTE — Progress Notes (Signed)
Called by primary RN for patient with decreased HR 30's and c/o dizziness and nausea.  BP stable.  On my arrival to patients room, MD and RN at bedside.  Patient alert, lying in trendelenburg with nasal cannula on.  HR 50's SBP 140's.  No RRT interventions at this time.  RN to call if assistance needed

## 2015-09-26 DIAGNOSIS — R0782 Intercostal pain: Secondary | ICD-10-CM

## 2015-09-26 DIAGNOSIS — E118 Type 2 diabetes mellitus with unspecified complications: Secondary | ICD-10-CM

## 2015-09-26 LAB — RENAL FUNCTION PANEL
ALBUMIN: 2.7 g/dL — AB (ref 3.5–5.0)
Anion gap: 16 — ABNORMAL HIGH (ref 5–15)
BUN: 68 mg/dL — AB (ref 6–20)
CHLORIDE: 98 mmol/L — AB (ref 101–111)
CO2: 22 mmol/L (ref 22–32)
Calcium: 7.6 mg/dL — ABNORMAL LOW (ref 8.9–10.3)
Creatinine, Ser: 11.4 mg/dL — ABNORMAL HIGH (ref 0.61–1.24)
GFR calc Af Amer: 5 mL/min — ABNORMAL LOW (ref >60)
GFR calc non Af Amer: 5 mL/min — ABNORMAL LOW (ref >60)
GLUCOSE: 142 mg/dL — AB (ref 65–99)
POTASSIUM: 6 mmol/L — AB (ref 3.5–5.1)
Phosphorus: 3.9 mg/dL (ref 2.5–4.6)
Sodium: 136 mmol/L (ref 135–145)

## 2015-09-26 LAB — TROPONIN I: Troponin I: 0.03 ng/mL (ref <0.031)

## 2015-09-26 LAB — CBC
HEMATOCRIT: 22.8 % — AB (ref 39.0–52.0)
Hemoglobin: 7.6 g/dL — ABNORMAL LOW (ref 13.0–17.0)
MCH: 28.9 pg (ref 26.0–34.0)
MCHC: 33.3 g/dL (ref 30.0–36.0)
MCV: 86.7 fL (ref 78.0–100.0)
Platelets: 173 10*3/uL (ref 150–400)
RBC: 2.63 MIL/uL — AB (ref 4.22–5.81)
RDW: 15.5 % (ref 11.5–15.5)
WBC: 6.1 10*3/uL (ref 4.0–10.5)

## 2015-09-26 LAB — HEPARIN LEVEL (UNFRACTIONATED): HEPARIN UNFRACTIONATED: 0.2 [IU]/mL — AB (ref 0.30–0.70)

## 2015-09-26 LAB — PREPARE RBC (CROSSMATCH)

## 2015-09-26 LAB — PROTIME-INR
INR: 2.95 — AB (ref 0.00–1.49)
PROTHROMBIN TIME: 30.2 s — AB (ref 11.6–15.2)

## 2015-09-26 MED ORDER — AMLODIPINE BESYLATE 10 MG PO TABS
10.0000 mg | ORAL_TABLET | Freq: Every day | ORAL | Status: DC
  Administered 2015-09-26 – 2015-09-27 (×2): 10 mg via ORAL
  Filled 2015-09-26 (×3): qty 1

## 2015-09-26 MED ORDER — HYDROMORPHONE HCL 1 MG/ML IJ SOLN
0.5000 mg | Freq: Once | INTRAMUSCULAR | Status: AC
Start: 2015-09-26 — End: 2015-09-26
  Administered 2015-09-26: 0.5 mg via INTRAVENOUS
  Filled 2015-09-26: qty 1

## 2015-09-26 MED ORDER — HYDROXYZINE HCL 10 MG PO TABS
10.0000 mg | ORAL_TABLET | Freq: Three times a day (TID) | ORAL | Status: DC | PRN
  Administered 2015-09-26: 10 mg via ORAL
  Filled 2015-09-26 (×2): qty 1

## 2015-09-26 MED ORDER — HYDROCODONE-ACETAMINOPHEN 5-325 MG PO TABS
1.0000 | ORAL_TABLET | ORAL | Status: DC | PRN
  Administered 2015-09-26 – 2015-09-27 (×2): 1 via ORAL
  Filled 2015-09-26 (×2): qty 1

## 2015-09-26 MED ORDER — SODIUM CHLORIDE 0.9 % IV SOLN: Freq: Once | INTRAVENOUS | Status: DC

## 2015-09-26 NOTE — Consult Note (Signed)
[facility] Consultation Note Requesting Physician:  Dr. Aileen Fass Reason for Consult:  Provision of dialysis services  HPI: The patient is a 50 y.o. year-old with PMH sig for DM, HTN, ESRD, CHF (EF 25%), anemia, and noncompliance with dialysis. Has  Dialysis home in [facility] but doesn't show up there very often. (His most recent treatments here at [facility] were 2/20, 2/25, 2/27, 3/2 and 3/4)  He was getting dialysis here on Saturday when his catheter was noted to have a hole in it.  The catheter was exchanged by Dr. Trula Slade and he subsequently received a full treatment. The plan was for discharge on Sunday (overnight obs d/t having cath replaced under [place]) but Saturday evening he developed bradycardia into the 30's. Received fluid bolus, atropineX2, glucagon and was transferred to ICU. Stable since that time.  Was not seen by cardiology. Has not been particularly cooperative with staff on [place].  Renal was just notified that he was still here. Called today for orders.    Past Medical History  Diagnosis Date  . Hypertension   . Depression   . Complication of anesthesia     itching, sore throat  . Diabetes mellitus without complication ([place])     No history per patient, but remains under history as A1c would not be accurate given on dialysis  . Shortness of breath   . Anxiety   . ESRD (end stage renal disease) ([place])     due to HTN per patient, followed at [facility], s/p failed kidney transplant - dialysis Tue, Th, Sat  . Renal insufficiency   . CHF (congestive heart failure) ([place])   . Anemia   . Dialysis patient [facility])      Past Surgical History  Procedure Laterality Date  . Kidney receipient  2006    failed and started HD in March 2014  . Capd insertion    . Capd removal    . Left heart catheterization with coronary angiogram N/A 09/02/2014    Procedure: LEFT HEART CATHETERIZATION WITH CORONARY ANGIOGRAM;  Surgeon: [name], MD;  Location: [facility] CATH LAB;  Service: Cardiovascular;  Laterality:  N/A;  . Inguinal hernia repair Right 02/14/2015    Procedure: REPAIR INCARCERATED RIGHT INGUINAL HERNIA;  Surgeon: [name], MD;  Location: [place];  Service: General;  Laterality: Right;  . Insertion of dialysis catheter Right 09/23/2015    Procedure: exchange of Right internal Dialysis Catheter.;  Surgeon: [name], MD;  Location: [facility] OR;  Service: Vascular;  Laterality: Right;    Family History  Problem Relation Age of Onset  . Hypertension Other    Social History:  reports that he has quit smoking. His smoking use included Cigarettes. He smoked 0.00 packs per day for 1 year. He has never used smokeless tobacco. He reports that he does not drink alcohol or use illicit drugs.   Allergies  Allergen Reactions  . Butalbital-Apap-Caffeine Shortness Of Breath and Swelling    Swelling in throat  . Ferrlecit [Na Ferric Gluc Cplx In Sucrose] Shortness Of Breath, Swelling and Other (See Comments)    Swelling in throat  . Minoxidil Shortness Of Breath  . Darvocet [Propoxyphene N-Acetaminophen] Hives  . Propoxyphene Hives    Allergy listed on file from [place] (Pt did not mention any allergies)      Prior to Admission medications   Medication Sig Start Date End Date Taking? Authorizing Provider  ALPRAZolam [name]) 0.5 MG tablet Take 1 tablet (0.5 mg total) by mouth 2 (  two) times daily as needed for anxiety. 09/11/15  Yes [name], MD  amLODipine (NORVASC) 10 MG tablet Take 10 mg by mouth daily.   Yes Historical Provider, MD  atorvastatin (LIPITOR) 40 MG tablet Take 1 tablet (40 mg total) by mouth daily at 6 PM. 09/02/14  Yes Barton Dubois, MD  carvedilol (COREG) 25 MG tablet Take 1 tablet (25 mg total) by mouth 2 (two) times daily with a meal. 10/10/14  Yes [name], DO  cinacalcet (SENSIPAR) 30 MG tablet Take 30 mg by mouth daily.   Yes Historical Provider, MD  docusate sodium (COLACE) 100 MG capsule Take 1 capsule (100 mg total) by mouth 2 (two) times daily. 11/19/14   Yes [name], MD  doxercalciferol (HECTOROL) 4 MCG/2ML injection Inject 1.25 mLs (2.5 mcg total) into the vein Every Tuesday,Thursday,and Saturday with dialysis. 10/10/14  Yes [name], DO  hydrALAZINE (APRESOLINE) 50 MG tablet Take 2 tablets (100 mg total) by mouth every 8 (eight) hours. 09/11/15  Yes [name], MD  hydrocerin (EUCERIN) CREA Apply 1 application topically 2 (two) times daily. 02/04/15  Yes [name], MD  HYDROcodone-acetaminophen (NORCO/VICODIN) 5-325 MG tablet Take 1 tablet by mouth every 6 (six) hours as needed. Patient taking differently: Take 1 tablet by mouth every 6 (six) hours as needed for moderate pain.  09/11/15  Yes [name], MD  isosorbide mononitrate (IMDUR) 30 MG 24 hr tablet Take 3 tablets (90 mg total) by mouth daily. 09/11/15  Yes [name], MD  levalbuterol [facility] HFA) 45 MCG/ACT inhaler Inhale 2 puffs into the lungs every 6 (six) hours as needed for wheezing or shortness of breath. 11/19/14  Yes [name], MD  lisinopril (PRINIVIL,ZESTRIL) 20 MG tablet Take 1 tablet (20 mg total) by mouth daily. 02/04/15  Yes [name], MD  multivitamin (RENA-VIT) TABS tablet Take 1 tablet by mouth at bedtime. 02/04/15  Yes [name], MD  omeprazole (PRILOSEC) 20 MG capsule Take 20 mg by mouth daily. 07/01/13  Yes Historical Provider, MD  predniSONE (DELTASONE) 5 MG tablet Take 5 mg by mouth daily with breakfast.   Yes Historical Provider, MD  sevelamer carbonate (RENVELA) 800 MG tablet Take 800-1,600 mg by mouth 3 (three) times daily with meals. 2 tabs three times daily with meals, and 1 tablet with snacks   Yes Historical Provider, MD  warfarin (COUMADIN) 6 MG tablet Take 1.5 tablets (9 mg total) by mouth daily at 6 PM. Take daily until adjusted per MD 09/12/15  Yes [name], MD  gabapentin (NEURONTIN) 100 MG capsule Take 100 mg by mouth 3 (three) times daily. 06/14/15 09/21/15  Historical Provider, MD    Inpatient  medications: . amLODipine  10 mg Oral Daily  . atorvastatin  40 mg Oral q1800  . cinacalcet  30 mg Oral Daily  . docusate sodium  100 mg Oral BID  . gabapentin  100 mg Oral TID  . glucagon (GLUCAGEN)  IVPB (bolus for beta blocker/calcium channel bocker overdose)  5 mg Intravenous Once  . hydrALAZINE  100 mg Oral 3 times per day  . isosorbide mononitrate  90 mg Oral Daily  . lisinopril  20 mg Oral Daily  . multivitamin  1 tablet Oral QHS  . pantoprazole  40 mg Oral Daily  . predniSONE  5 mg Oral Q breakfast  . sevelamer carbonate  1,600 mg Oral TID WC  . sodium chloride flush  3  mL Intravenous Q12H  . Warfarin - Pharmacist Dosing Inpatient   Does not apply q1800    Review of Systems See HPI   Physical Exam:  Blood pressure 166/119, pulse 77, temperature 97.8 F (36.6 C), temperature source Oral, resp. rate 20, height _0  (1.88 m), weight 74.8 kg (164 lb 14.5 oz), SpO2 99 %.  Gen: Younger AAM NAD R sided TDC with local tenderness Chest: clear Heart: Regular, rate 70's no rub or gallop Abdomen: soft, not tender Ext: edema Neuro: alert, Ox3, no focal deficit   Labs: Basic Metabolic Panel:  Recent Labs Lab 09/19/15 2207 09/21/15 0736 09/23/15 1240 09/24/15 0324  NA 137 137 140 138  K 4.8 4.9 5.1 4.1  CL 101 101 99* 101  CO2 26 19* 28 26  GLUCOSE 158* 143* 138* 86  BUN 49* 76* 55* 24*  CREATININE 11.21* 13.26* 9.55* 5.77*  CALCIUM 8.3* 8.2* 8.6* 9.0  PHOS  --   --  4.2 3.4     Recent Labs Lab 09/23/15 1240 09/24/15 0324  ALBUMIN 2.8* 2.8*     Recent Labs Lab 09/21/15 0736 09/23/15 1240 09/25/15 0325 09/25/15 2150 09/26/15 0605  WBC 7.1 6.3 5.9  --  6.1  NEUTROABS 5.9  --   --   --   --   HGB 7.3* 7.4* 6.9* 8.0* 7.6*  HCT 21.8* 23.8* 21.3* 24.9* 22.8*  MCV 87.2 88.5 88.0  --  86.7  PLT 142* 173 155  --  173     Recent Labs Lab 09/24/15 0324 09/24/15 1033 09/26/15 0615  TROPONINI 0.06* 0.06* 0.03     Recent Labs Lab 09/25/15 1435  09/25/15 1455 09/25/15 1520 09/25/15 1809 09/25/15 2255  GLUCAP 55* 71 78 92 79    Impression/Plan  1. ESRD - HD today. TTS. Check labs pre HD (last done 3/5) 2. TDC malfunction - s/p TDC exchange on 09/23/15 d/t hole in catheter 3. S/p profound bradycardia - resolved. Has had prior episodes associated with hyperkalemia, but K OK on this occasion (occurred post HD). Trop flat. Cards did not see.  4. Anemia  - primary requests transfusion on HD - will give 1 unit 5. Medical non-compliance 6. PAF 7. H/O DVT/PE - pharmacy managing anticoagulation 8. Chronic pain/narcotic use   Jamal Maes,  MD [facility] [phone] pager 09/26/2015, 2:54 PM

## 2015-09-26 NOTE — Progress Notes (Signed)
Took his monitor off and went to the bathroom to wash . CMT aware. Placed back after washing.

## 2015-09-26 NOTE — Progress Notes (Signed)
[facility] PROGRESS NOTE    Progress Note   [name] ZRV:[id number] DOB: 17-Feb-1966 DOA: 09/23/2015 PCP: [name] Patient   Brief Narrative:   Frank [name] is an 50 y.o. male past medical history of end-stage renal disease on dialysis Thursday status is at _0 /04/17 2317    Code Status History    Date Active Date Inactive Code Status Order ID Comments User Context   09/08/2015  4:07 AM 09/11/2015  5:40 PM Full Code [id number]  [name], MD Inpatient   08/18/2015  6:06 PM 08/20/2015  8:49 PM Full Code [id number]  [name], MD Inpatient   06/19/2015 12:04 PM 06/19/2015  8:49 PM Full Code [id number]  [name], MD Inpatient   02/11/2015  2:09 AM 02/16/2015 10:07 PM Full Code [id number]  [name], MD ED   01/31/2015  9:12 PM 02/04/2015  4:50 PM Full Code [id number]  [name], MD Inpatient   01/02/2015  4:29 PM 01/03/2015  5:33 PM Full Code [id number]  [name], MD Inpatient   11/29/2014  3:19 AM 12/05/2014  5:30 PM Full Code [id number]  [name], MD ED   11/17/2014 10:06 AM 11/19/2014 12:17 PM Full Code [id number]  [name], MD ED   10/07/2014  4:29 PM 10/10/2014  5:48 PM Full Code [id number]  [name], MD Inpatient   09/30/2014 11:42 PM 10/02/2014  7:14 PM Full  Code [id number]  [name], DO ED   09/02/2014  3:16 PM 09/02/2014 10:15 PM Full Code [id number]  [name], MD Inpatient   08/30/2014  6:29 PM 09/02/2014  3:16 PM Full Code [id number]  [name], NP ED   05/08/2014  9:04 AM 05/13/2014  5:50 PM Full Code [id number]  [name], MD ED   04/05/2014  7:16 PM 04/06/2014  3:04 PM Full Code [id number]  [name], MD Inpatient   03/27/2014  9:43 AM 03/27/2014  9:38 PM Full Code [id number]  [name], MD Inpatient   01/19/2014  5:35 AM  01/19/2014  8:19 PM Full Code [id number]  [name], MD Inpatient   12/12/2013  8:07 PM 12/13/2013  7:42 PM Full Code [id number]  [name], MD Inpatient   10/17/2013  4:15 PM 10/18/2013  4:39 PM Full Code [id number]  [name], MD Inpatient   09/16/2013  8:55 PM 09/20/2013  7:20 PM Full Code [id number]  [name], NP Inpatient   09/12/2013  2:48 AM 09/12/2013 10:56 PM Full Code [id number]  [name], MD Inpatient   06/24/2013  1:27 PM 06/24/2013  7:16 PM Full Code [id number]  [name], MD Inpatient        IV Access:    Peripheral IV   Procedures and diagnostic studies:   No results found.   Medical Consultants:    None.  Anti-Infectives:   Anti-infectives    Start     Dose/Rate Route Frequency Ordered Stop   09/23/15 1630  vancomycin (VANCOCIN) 1 GM/200ML IVPB    Comments:  Marinda Elk   : cabinet override      09/23/15 1630 09/23/15 1737      Subjective:    Frank [name] complaining of pain with a catheter was exchanged and the left shoulder. Likely musculoskeletal.  Objective:    Filed Vitals:   09/26/15 0300 09/26/15 0400 09/26/15 0435 09/26/15 0500  BP:  158/124    Pulse: 72 117 72 71  Temp:  97.5 F (36.4 C)    TempSrc:  Oral    Resp: _0 Height:      Weight:      SpO2: 98% 92% 98% 97%    Intake/Output Summary (Last 24 hours) at 09/26/15 0726 Last data filed at 09/26/15 0500  Gross per 24 hour  Intake 649.93 ml  Output      0 ml  Net 649.93 ml   Filed Weights   09/23/15 2330 09/24/15 0528  Weight: 74.98 kg (165 lb 4.8 oz) 74.8 kg (164 lb 14.5 oz)    Exam: Gen:  NAD Cardiovascular:  RRR.+JVD, Chest pain is reproducible by palpation. Chest and lungs:   Good Air movement and clear auscultation. Abdomen:  Abdomen soft, NT/ND, + BS Extremities:  No edema   Data Reviewed:    Labs: Basic Metabolic Panel:  Recent Labs Lab 09/19/15 2207 09/21/15 0736 09/23/15 1240 09/24/15 0324  NA 137 137 140 138  K  4.8 4.9 5.1 4.1  CL 101 101 99* 101  CO2 26 19* 28 26  GLUCOSE 158* 143* 138* 86  BUN 49* 76* 55* 24*  CREATININE 11.21* 13.26* 9.55* 5.77*  CALCIUM 8.3* 8.2* 8.6* 9.0  PHOS  --   --  4.2 3.4   GFR Estimated Creatinine Clearance: 16 mL/min (by C-G formula based on Cr of 5.77). Liver Function Tests:  Recent Labs Lab 09/23/15 1240  09/24/15 0324  ALBUMIN 2.8* 2.8*   No results for input(s): LIPASE, AMYLASE in the last 168 hours. No results for input(s): AMMONIA in the last 168 hours. Coagulation profile  Recent Labs Lab 09/23/15 1655 09/24/15 1033 09/25/15 0325 09/26/15 0615  INR 1.32 1.40 1.68* 2.95*    CBC:  Recent Labs Lab 09/19/15 2207 09/21/15 0736 09/23/15 1240 09/25/15 0325 09/25/15 2150 09/26/15 0605  WBC 4.7 7.1 6.3 5.9  --  6.1  NEUTROABS  --  5.9  --   --   --   --   HGB 6.8* 7.3* 7.4* 6.9* 8.0* 7.6*  HCT 21.1* 21.8* 23.8* 21.3* 24.9* 22.8*  MCV 85.4 87.2 88.5 88.0  --  86.7  PLT 160 142* 173 155  --  173   Cardiac Enzymes:  Recent Labs Lab 09/24/15 0324 09/24/15 1033  TROPONINI 0.06* 0.06*   BNP (last 3 results) No results for input(s): PROBNP in the last 8760 hours. CBG:  Recent Labs Lab 09/25/15 1435 09/25/15 1455 09/25/15 1520 09/25/15 1809 09/25/15 2255  GLUCAP 55* 71 78 92 79   D-Dimer: No results for input(s): DDIMER in the last 72 hours. Hgb A1c: No results for input(s): HGBA1C in the last 72 hours. Lipid Profile: No results for input(s): CHOL, HDL, LDLCALC, TRIG, CHOLHDL, LDLDIRECT in the last 72 hours. Thyroid function studies: No results for input(s): TSH, T4TOTAL, T3FREE, THYROIDAB in the last 72 hours.  Invalid input(s): FREET3 Anemia work up: No results for input(s): VITAMINB12, FOLATE, FERRITIN, TIBC, IRON, RETICCTPCT in the last 72 hours. Sepsis Labs:  Recent Labs Lab 09/21/15 0736 09/23/15 1240 09/25/15 0325 09/26/15 0605  WBC 7.1 6.3 5.9 6.1   Microbiology No results found for this or any previous  visit (from the past 240 hour(s)).   Medications:   . atorvastatin  40 mg Oral q1800  . atropine      . cinacalcet  30 mg Oral Daily  . docusate sodium  100 mg Oral BID  . gabapentin  100 mg Oral TID  . glucagon (GLUCAGEN)  IVPB (bolus for beta blocker/calcium channel bocker overdose)  5 mg Intravenous Once  . hydrALAZINE  100 mg Oral 3 times per day  . isosorbide mononitrate  90 mg Oral Daily  . lisinopril  20 mg Oral Daily  . multivitamin  1 tablet Oral QHS  . pantoprazole  40 mg Oral Daily  . predniSONE  5 mg Oral Q breakfast  . sevelamer carbonate  1,600 mg Oral TID WC  . sodium chloride flush  3 mL Intravenous Q12H  . Warfarin - Pharmacist Dosing Inpatient   Does not apply q1800   Continuous Infusions: . heparin 2,100 Units/hr (09/26/15 0153)    Time spent: 15 min   LOS: 2 days   [name]  [facility] Pager [phone]  *Please refer to [email or web address], password [email or web address] to get updated schedule on who will round on this patient, as hospitalists switch teams weekly. If 7PM-7AM, please contact night-coverage at [email or web address], password [email or web address] for any overnight needs.  09/26/2015, 7:26 AM

## 2015-09-26 NOTE — Progress Notes (Signed)
ANTICOAGULATION CONSULT NOTE - Follow Up Consult  Pharmacy Consult for coumadin Indication: Hx of PE.  Allergies  Allergen Reactions  . Butalbital-Apap-Caffeine Shortness Of Breath and Swelling    Swelling in throat  . Ferrlecit [Na Ferric Gluc Cplx In Sucrose] Shortness Of Breath, Swelling and Other (See Comments)    Swelling in throat  . Minoxidil Shortness Of Breath  . Darvocet [Propoxyphene N-Acetaminophen] Hives  . Propoxyphene Hives    Allergy listed on file from [place] (Pt did not mention any allergies)     Patient Measurements: Height: _0  (188 cm) Weight:  (pt refused) IBW/kg (Calculated) : 82.2  Dosing weight = 75 kg  Vital Signs: Temp: 97.8 F (36.6 C) (03/07 0743) Temp Source: Oral (03/07 0743) BP: 142/98 mmHg (03/07 0743) Pulse Rate: 73 (03/07 0743)  Labs:  Recent Labs  09/23/15 1240  09/23/15 1655 09/24/15 0324 09/24/15 1033  09/25/15 0325 09/25/15 0941 09/25/15 1916 09/25/15 2150 09/26/15 0605 09/26/15 0615  HGB 7.4*  --   --   --   --   --  6.9*  --   --  8.0* 7.6*  --   HCT 23.8*  --   --   --   --   --  21.3*  --   --  24.9* 22.8*  --   PLT 173  --   --   --   --   --  155  --   --   --  173  --   APTT  --   --  26  --   --   --   --   --   --   --   --   --   LABPROT  --   < > 16.5*  --  17.3*  --  19.8*  --   --   --   --  30.2*  INR  --   < > 1.32  --  1.40  --  1.68*  --   --   --   --  2.95*  HEPARINUNFRC  --   --   --   --   --   < >  --  <0.10* <0.10*  --   --  0.20*  CREATININE 9.55*  --   --  5.77*  --   --   --   --   --   --   --   --   TROPONINI  --   --   --  0.06* 0.06*  --   --   --   --   --   --  0.03  < > = values in this interval not displayed.  Estimated Creatinine Clearance: 16 mL/min (by C-G formula based on Cr of 5.77).   Assessment: 50 yo M admitted 09/23/2015 on coumadin PTA for hx of PE. Pharmacy consulted to manage coumadin. He was on heparin (started 3/5) and this was discontinued on 3/7 with INR of 2.95.  Hg= 7.6, plt= 173 -INR increased from 1.68 to 2.95 in the last 24 hrs. LFTs= 50/19 on 09/18/2015  Goal of Therapy:  INR= 2-3 Monitor platelets by anticoagulation protocol: Yes   Plan -Hold coumadin today  -Daily PT/INR  Hildred Laser, Pharm D 09/26/2015 10:25 AM      Plan:

## 2015-09-26 NOTE — Consult Note (Signed)
[facility] Consultation Note Requesting Physician:  Dr. Aileen Fass Reason for Consult:  Provision of dialysis services  HPI: The patient is a 50 y.o. year-old with PMH sig for DM, HTN, ESRD, CHF (EF 25%), anemia, and noncompliance with dialysis. Has  Dialysis home in [facility] but doesn't show up there very often. (His most recent treatments here at [facility] were 2/20, 2/25, 2/27, 3/2 and 3/4)  He was getting dialysis here on Saturday when his catheter was noted to have a hole in it.  The catheter was exchanged by Dr. Trula Slade and he subsequently received a full treatment. The plan was for discharge on Sunday (overnight obs d/t having cath replaced under [place]) but Saturday evening he developed bradycardia into the 30's. Received fluid bolus, atropineX2, glucagon and was transferred to ICU. Stable since that time.  Was not seen by cardiology. Has not been particularly cooperative with staff on [place].  Renal was just notified that he was still here. Called today for orders.   Presently other than some chest wall pain from the replacement of his catheter he has no complaints. Says "We only need to take of 5-6 lb today".   Past Medical History  Diagnosis Date  . Hypertension   . Depression   . Complication of anesthesia     itching, sore throat  . Diabetes mellitus without complication ([place])     No history per patient, but remains under history as A1c would not be accurate given on dialysis  . Shortness of breath   . Anxiety   . ESRD (end stage renal disease) ([place])     due to HTN per patient, followed at [facility], s/p failed kidney transplant - dialysis Tue, Th, Sat  . Renal insufficiency   . CHF (congestive heart failure) ([place])   . Anemia   . Dialysis patient [facility])      Past Surgical History  Procedure Laterality Date  . Kidney receipient  2006    failed and started HD in March 2014  . Capd insertion    . Capd removal    . Left heart catheterization with coronary angiogram N/A 09/02/2014     Procedure: LEFT HEART CATHETERIZATION WITH CORONARY ANGIOGRAM;  Surgeon: [name], MD;  Location: [facility] CATH LAB;  Service: Cardiovascular;  Laterality: N/A;  . Inguinal hernia repair Right 02/14/2015    Procedure: REPAIR INCARCERATED RIGHT INGUINAL HERNIA;  Surgeon: [name], MD;  Location: [place];  Service: General;  Laterality: Right;  . Insertion of dialysis catheter Right 09/23/2015    Procedure: exchange of Right internal Dialysis Catheter.;  Surgeon: [name], MD;  Location[facility] OR;  Service: Vascular;  Laterality: Right;    Family History  Problem Relation Age of Onset  . Hypertension Other    Social History:  reports that he has quit smoking. His smoking use included Cigarettes. He smoked 0.00 packs per day for 1 year. He has never used smokeless tobacco. He reports that he does not drink alcohol or use illicit drugs.   Allergies  Allergen Reactions  . Butalbital-Apap-Caffeine Shortness Of Breath and Swelling    Swelling in throat  . Ferrlecit [Na Ferric Gluc Cplx In Sucrose] Shortness Of Breath, Swelling and Other (See Comments)    Swelling in throat  . Minoxidil Shortness Of Breath  . Darvocet [Propoxyphene N-Acetaminophen] Hives  . Propoxyphene Hives    Allergy listed on file from [facility] (Pt did not mention any allergies)      Prior to  Admission medications   Medication Sig Start Date End Date Taking? Authorizing Provider  ALPRAZolam [name]) 0.5 MG tablet Take 1 tablet (0.5 mg total) by mouth 2 (two) times daily as needed for anxiety. 09/11/15  Yes [name], MD  amLODipine (NORVASC) 10 MG tablet Take 10 mg by mouth daily.   Yes Historical Provider, MD  atorvastatin (LIPITOR) 40 MG tablet Take 1 tablet (40 mg total) by mouth daily at 6 PM. 09/02/14  Yes Barton Dubois, MD  carvedilol (COREG) 25 MG tablet Take 1 tablet (25 mg total) by mouth 2 (two) times daily with a meal. 10/10/14  Yes [name], DO  cinacalcet (SENSIPAR) 30 MG tablet Take 30 mg by  mouth daily.   Yes Historical Provider, MD  docusate sodium (COLACE) 100 MG capsule Take 1 capsule (100 mg total) by mouth 2 (two) times daily. 11/19/14  Yes [name], MD  doxercalciferol (HECTOROL) 4 MCG/2ML injection Inject 1.25 mLs (2.5 mcg total) into the vein Every Tuesday,Thursday,and Saturday with dialysis. 10/10/14  Yes [name], DO  hydrALAZINE (APRESOLINE) 50 MG tablet Take 2 tablets (100 mg total) by mouth every 8 (eight) hours. 09/11/15  Yes [name], MD  hydrocerin (EUCERIN) CREA Apply 1 application topically 2 (two) times daily. 02/04/15  Yes [name], MD  HYDROcodone-acetaminophen (NORCO/VICODIN) 5-325 MG tablet Take 1 tablet by mouth every 6 (six) hours as needed. Patient taking differently: Take 1 tablet by mouth every 6 (six) hours as needed for moderate pain.  09/11/15  Yes [name], MD  isosorbide mononitrate (IMDUR) 30 MG 24 hr tablet Take 3 tablets (90 mg total) by mouth daily. 09/11/15  Yes [name], MD  levalbuterol [facility] HFA) 45 MCG/ACT inhaler Inhale 2 puffs into the lungs every 6 (six) hours as needed for wheezing or shortness of breath. 11/19/14  Yes [name], MD  lisinopril (PRINIVIL,ZESTRIL) 20 MG tablet Take 1 tablet (20 mg total) by mouth daily. 02/04/15  Yes [name], MD  multivitamin (RENA-VIT) TABS tablet Take 1 tablet by mouth at bedtime. 02/04/15  Yes [name], MD  omeprazole (PRILOSEC) 20 MG capsule Take 20 mg by mouth daily. 07/01/13  Yes Historical Provider, MD  predniSONE (DELTASONE) 5 MG tablet Take 5 mg by mouth daily with breakfast.   Yes Historical Provider, MD  sevelamer carbonate (RENVELA) 800 MG tablet Take 800-1,600 mg by mouth 3 (three) times daily with meals. 2 tabs three times daily with meals, and 1 tablet with snacks   Yes Historical Provider, MD  warfarin (COUMADIN) 6 MG tablet Take 1.5 tablets (9 mg total) by mouth daily at 6 PM. Take daily until adjusted per MD 09/12/15  Yes [name], MD   gabapentin (NEURONTIN) 100 MG capsule Take 100 mg by mouth 3 (three) times daily. 06/14/15 09/21/15  Historical Provider, MD    Inpatient medications: . amLODipine  10 mg Oral Daily  . atorvastatin  40 mg Oral q1800  . cinacalcet  30 mg Oral Daily  . docusate sodium  100 mg Oral BID  . gabapentin  100 mg Oral TID  . glucagon (GLUCAGEN)  IVPB (bolus for beta blocker/calcium channel bocker overdose)  5 mg Intravenous Once  . hydrALAZINE  100 mg Oral 3 times per day  . isosorbide mononitrate  90 mg Oral Daily  . lisinopril  20 mg Oral Daily  . multivitamin  1 tablet Oral QHS  . pantoprazole  40 mg  Oral Daily  . predniSONE  5 mg Oral Q breakfast  . sevelamer carbonate  1,600 mg Oral TID WC  . sodium chloride flush  3 mL Intravenous Q12H  . Warfarin - Pharmacist Dosing Inpatient   Does not apply q1800    Review of Systems See HPI   Physical Exam:  Blood pressure 166/119, pulse 77, temperature 97.8 F (36.6 C), temperature source Oral, resp. rate 20, height _0  (1.88 m), weight 74.8 kg (164 lb 14.5 oz), SpO2 99 %.  Gen: Younger AAM NAD Sleeping when I came in R sided TDC with local tenderness Chest: clear no crackles or wheezes Heart: Regular, rate 70's no rub or gallop Abdomen: soft, not tender Ext: No edema. Dry skin, nails in poor repair Neuro: alert, Ox3, no focal deficit   Labs: Basic Metabolic Panel:  Recent Labs Lab 09/19/15 2207 09/21/15 0736 09/23/15 1240 09/24/15 0324  NA 137 137 140 138  K 4.8 4.9 5.1 4.1  CL 101 101 99* 101  CO2 26 19* 28 26  GLUCOSE 158* 143* 138* 86  BUN 49* 76* 55* 24*  CREATININE 11.21* 13.26* 9.55* 5.77*  CALCIUM 8.3* 8.2* 8.6* 9.0  PHOS  --   --  4.2 3.4     Recent Labs Lab 09/23/15 1240 09/24/15 0324  ALBUMIN 2.8* 2.8*     Recent Labs Lab 09/21/15 0736 09/23/15 1240 09/25/15 0325 09/25/15 2150 09/26/15 0605  WBC 7.1 6.3 5.9  --  6.1  NEUTROABS 5.9  --   --   --   --   HGB 7.3* 7.4* 6.9* 8.0* 7.6*  HCT 21.8*  23.8* 21.3* 24.9* 22.8*  MCV 87.2 88.5 88.0  --  86.7  PLT 142* 173 155  --  173     Recent Labs Lab 09/24/15 0324 09/24/15 1033 09/26/15 0615  TROPONINI 0.06* 0.06* 0.03     Recent Labs Lab 09/25/15 1435 09/25/15 1455 09/25/15 1520 09/25/15 1809 09/25/15 2255  GLUCAP 55* 71 78 92 79    Impression/Plan  1. ESRD - HD today. TTS. Check labs pre HD (last done 3/5) 2. TDC malfunction - s/p TDC exchange on 09/23/15 d/t hole in catheter 3. S/p profound bradycardia - resolved. Has had prior episodes associated with hyperkalemia, but K OK on this occasion (occurred post HD). Trop flat. Cards did not see.  4. Anemia  - primary requests transfusion on HD - will give 1 unit. Never goes to his unit so never gets ESA. No recent Fe studies here.  5. CKD-MBD - on binders and cinacalcet in the hospital. No recent PTH data 6. Medical non-compliance 7. PAF - SR at present 8. H/O DVT/PE - pharmacy managing anticoagulation 9. Chronic pain/narcotic use   Jamal Maes,  MD [facility] [phone] pager 09/26/2015, 3:36 PM

## 2015-09-26 NOTE — Progress Notes (Signed)
Came to patient's room to follow up on his complain of 9/10  chest pain. Patient is sleeping soundly and does not want to be disturbed

## 2015-09-26 NOTE — Progress Notes (Signed)
Patient complains of 9/10 chest pain to RN, however patient refuses ekg for now. States "am not ready for ekg now".provider oncall informed. Will continue to monitor

## 2015-09-26 NOTE — Care Management Important Message (Signed)
Important Message  Patient Details  Name: Frank [name] MRN: [id number] Date of Birth: [birth year]   Medicare Important Message Given:  Yes    Seva Chancy, [place] 09/26/2015, 8:22 AM

## 2015-09-26 NOTE — Progress Notes (Signed)
Patient is verbally abusive to staff. Shouts and orders staff with his needs. Asked me to power flush his IV line after receiving a prn dilaudid iv medication.pr

## 2015-09-26 NOTE — Procedures (Signed)
No issues with HD catheter. BFR 400 Weight 76.79 pre HD 2K bath - K 6 pre HD To get 1 unit PRBC's with HD  Jamal Maes, MD [place] Pager 09/26/2015, 5:54 PM

## 2015-09-26 NOTE — Progress Notes (Signed)
Patient is sitting in bed eating comfortably.

## 2015-09-27 LAB — TYPE AND SCREEN
ABO/RH(D): B POS
ANTIBODY SCREEN: NEGATIVE
Unit division: 0

## 2015-09-27 LAB — PROTIME-INR
INR: 3.4 — ABNORMAL HIGH (ref 0.00–1.49)
Prothrombin Time: 33.6 seconds — ABNORMAL HIGH (ref 11.6–15.2)

## 2015-09-27 LAB — CBC
HCT: 26.3 % — ABNORMAL LOW (ref 39.0–52.0)
Hemoglobin: 8.9 g/dL — ABNORMAL LOW (ref 13.0–17.0)
MCH: 29.9 pg (ref 26.0–34.0)
MCHC: 33.8 g/dL (ref 30.0–36.0)
MCV: 88.3 fL (ref 78.0–100.0)
PLATELETS: 188 10*3/uL (ref 150–400)
RBC: 2.98 MIL/uL — ABNORMAL LOW (ref 4.22–5.81)
RDW: 15.8 % — AB (ref 11.5–15.5)
WBC: 7.2 10*3/uL (ref 4.0–10.5)

## 2015-09-27 MED ORDER — HYDROCODONE-ACETAMINOPHEN 5-325 MG PO TABS: 1.0000 | ORAL_TABLET | Freq: Four times a day (QID) | ORAL | Status: DC | PRN

## 2015-09-27 MED ORDER — CARVEDILOL 6.25 MG PO TABS: 6.2500 mg | ORAL_TABLET | Freq: Two times a day (BID) | ORAL | Status: DC

## 2015-09-27 MED ORDER — HYDROXYZINE HCL 10 MG PO TABS: 10.0000 mg | ORAL_TABLET | Freq: Three times a day (TID) | ORAL | Status: DC | PRN

## 2015-09-27 NOTE — Discharge Summary (Signed)
Physician Discharge Summary  Frank [name] YIR:[id number] DOB: [birth year] DOA: 09/23/2015  PCP: [name] Patient  Admit date: 09/23/2015 Discharge date: 09/27/2015  Time spent:35 minutes  Recommendations for Outpatient Follow-up:  1. Follow-up with primary care doctor in 2-4 weeks. 2. Will need education about taking his medications.   Discharge Diagnoses:  Principal Problem:   Acute on chronic combined systolic (congestive) and diastolic (congestive) heart failure (HCC) Active Problems:   ESRD on hemodialysis (HCC)   Chronic pulmonary embolism (HCC)   Dyslipidemia   Chest pain   Hypertensive urgency   Diabetes mellitus, controlled ([place])   Discharge Condition: Stable  Diet recommendation: renal  Filed Weights   09/24/15 0528 09/26/15 1650 09/26/15 2049  Weight: 74.8 kg (164 lb 14.5 oz) 76.794 kg (169 lb 4.8 oz) 73.3 kg (161 lb 9.6 oz)    History of present illness:  50 year old male with past medical history of ESRD on HD(T/TH/Sat), combined systolic and diastolic congestive heart failure last EF 20-25% in 08/2015, diabetes mellitus type 2, anxiety/depression, PE on chronic anticoagulation of Coumadin, anemia of chronic disease, and noncompliance; who presented to receive dialysis with reports of shortness of breath and chest discomfort. He reports being unable to dialyze at his dialysis center due to transportation issues. Patient's last hemodialysis session was apparently 2 days ago. It appears he does not have a set routine when he dialyzes and nephrology who called for the patient to be admitted questions his compliance with taking his medications  Hospital Course:  Acute on chronic combined diastolic heart failure: Likely due to noncompliance, he was continued on indoor and lisinopril. Renal was consulted who dialyzed him.  Vascular access complication: He is status post dialysis catheter exchange.  End-stage renal disease: Has been noncompliant for a years. We had a long  discussion about his  life expectancy had not been compliant with dialysis.  Anemia of chronic disease: He received 1 unit of liquid blood cells with dialysis, his hemoglobin is now 8.9.  Paroxysmal atrial fibrillation: CHADS VASC 2 score of > 3, is currently rate controlled INR sub-therapeutic.  History of DVT and PE: INR was subtherapeutic on admission he was started on heparin now his INR is therapeutic. He will continue Coumadin as an outpatient.  History of chronic pain/narcotic abuse: Continue orals narcotics an outpatient.  Noncompliance: He has been counseled.  Chronic pulmonary embolism (HCC) Continue Coumadin per pharmacy. We'll DC heparin continue Coumadin.  Hypertensive urgency Likely due to medication noncompliance. His blood pressure has improve only to follow-up with PCP as an outpatient.  Extreme Sinus bradycardia: Patient relates after being severely bradycardic that he was not taking his Coreg. He was transferred to the stepdown unit glucagon was given pacing pads were placed. He was monitored overnight in step down his heart rate improved. No further doses of glucagon given. Continue lower dose of Coreg 6.25 twice a day.  Procedures:  Hemodialysis catheter exchange on 09/21/2015  Consultations:  Vascular   renal  Discharge Exam: Filed Vitals:   09/27/15 0628 09/27/15 1005  BP: 158/113 139/82  Pulse: 84 79  Temp: 98.5 F (36.9 C) 97.6 F (36.4 C)  Resp: 19 16    General: A&O x3 Cardiovascular: RRR Respiratory: good air movement CTA B/L  Discharge Instructions   Discharge Instructions    Diet - low sodium heart healthy    Complete by:  As directed      Increase activity slowly    Complete by:  As directed  Current Discharge Medication List    START taking these medications   Details  hydrOXYzine (ATARAX/VISTARIL) 10 MG tablet Take 1 tablet (10 mg total) by mouth 3 (three) times daily as needed for itching. Qty: 30  tablet, Refills: 0      CONTINUE these medications which have CHANGED   Details  carvedilol (COREG) 6.25 MG tablet Take 1 tablet (6.25 mg total) by mouth 2 (two) times daily with a meal. Qty: 60 tablet, Refills: 3    HYDROcodone-acetaminophen (NORCO/VICODIN) 5-325 MG tablet Take 1 tablet by mouth every 6 (six) hours as needed. Qty: 10 tablet, Refills: 0      CONTINUE these medications which have NOT CHANGED   Details  ALPRAZolam (XANAX) 0.5 MG tablet Take 1 tablet (0.5 mg total) by mouth 2 (two) times daily as needed for anxiety. Qty: 10 tablet, Refills: 0    amLODipine (NORVASC) 10 MG tablet Take 10 mg by mouth daily.    atorvastatin (LIPITOR) 40 MG tablet Take 1 tablet (40 mg total) by mouth daily at 6 PM. Qty: 30 tablet, Refills: 1    cinacalcet (SENSIPAR) 30 MG tablet Take 30 mg by mouth daily.    docusate sodium (COLACE) 100 MG capsule Take 1 capsule (100 mg total) by mouth 2 (two) times daily. Qty: 60 capsule, Refills: 0    doxercalciferol (HECTOROL) 4 MCG/2ML injection Inject 1.25 mLs (2.5 mcg total) into the vein Every Tuesday,Thursday,and Saturday with dialysis. Qty: 2 mL    hydrALAZINE (APRESOLINE) 50 MG tablet Take 2 tablets (100 mg total) by mouth every 8 (eight) hours. Qty: 90 tablet, Refills: 1    hydrocerin (EUCERIN) CREA Apply 1 application topically 2 (two) times daily. Qty: 454 g, Refills: 0    isosorbide mononitrate (IMDUR) 30 MG 24 hr tablet Take 3 tablets (90 mg total) by mouth daily. Qty: 90 tablet, Refills: 0    levalbuterol (XOPENEX HFA) 45 MCG/ACT inhaler Inhale 2 puffs into the lungs every 6 (six) hours as needed for wheezing or shortness of breath. Qty: 1 Inhaler, Refills: 2    lisinopril (PRINIVIL,ZESTRIL) 20 MG tablet Take 1 tablet (20 mg total) by mouth daily. Qty: 30 tablet, Refills: 1    multivitamin (RENA-VIT) TABS tablet Take 1 tablet by mouth at bedtime. Qty: 30 tablet, Refills: 1    omeprazole (PRILOSEC) 20 MG capsule Take 20 mg by  mouth daily.    predniSONE (DELTASONE) 5 MG tablet Take 5 mg by mouth daily with breakfast.    sevelamer carbonate (RENVELA) 800 MG tablet Take 800-1,600 mg by mouth 3 (three) times daily with meals. 2 tabs three times daily with meals, and 1 tablet with snacks    warfarin (COUMADIN) 6 MG tablet Take 1.5 tablets (9 mg total) by mouth daily at 6 PM. Take daily until adjusted per MD Qty: 20 tablet, Refills: 0    gabapentin (NEURONTIN) 100 MG capsule Take 100 mg by mouth 3 (three) times daily.       Allergies  Allergen Reactions  . Butalbital-Apap-Caffeine Shortness Of Breath and Swelling    Swelling in throat  . Ferrlecit [Na Ferric Gluc Cplx In Sucrose] Shortness Of Breath, Swelling and Other (See Comments)    Swelling in throat  . Minoxidil Shortness Of Breath  . Darvocet [Propoxyphene N-Acetaminophen] Hives  . Propoxyphene Hives    Allergy listed on [facility] from [facility] (Pt did not mention any allergies)       The results of significant diagnostics from this hospitalization (including imaging,  microbiology, ancillary and laboratory) are listed below for reference.    Significant Diagnostic Studies: Dg Chest 2 View  09/23/2015  CLINICAL DATA:  Chest pain and shortness of breath, dialysis patient EXAM: CHEST  2 VIEW COMPARISON:  09/21/2015 FINDINGS: Dialysis catheter unchanged with tip into the right atrium. Severe cardiac silhouette enlargement. Central vascular congestion similar to prior study. No pulmonary edema or pleural effusion. IMPRESSION: Cardiomegaly with central vascular congestion similar to prior studies with no acute findings Electronically Signed   By: Skipper Cliche M.D.   On: 09/23/2015 07:55   Dg Chest 2 View  09/21/2015  CLINICAL DATA:  Shortness of breath. EXAM: CHEST  2 VIEW COMPARISON:  September 19, 2015. FINDINGS: Stable cardiomegaly. Right internal jugular dialysis catheter is unchanged with distal tip in expected position of right atrium. No pneumothorax or  pleural effusion is noted. No acute pulmonary disease is noted. Mild central pulmonary vascular congestion is noted. Bony thorax is unremarkable. IMPRESSION: Stable cardiomegaly with central pulmonary vascular congestion. Electronically Signed   By: Marijo Conception, M.D.   On: 09/21/2015 07:21   Dg Chest 2 View  09/19/2015  CLINICAL DATA:  Acute onset of shortness of breath, nausea and vomiting. Mid chest pain and cough. Initial encounter. EXAM: CHEST  2 VIEW COMPARISON:  Chest radiograph from 09/16/2015 FINDINGS: Vascular congestion is noted. Mildly increased interstitial markings raise concern for mild interstitial edema. No pleural effusion or pneumothorax is seen. The cardiomediastinal silhouette is enlarged. No acute osseous abnormalities are identified. A right-sided dual-lumen catheter is noted ending within the right atrium. IMPRESSION: Vascular congestion and cardiomegaly. Mildly increased interstitial markings raise concern for mild interstitial edema. Electronically Signed   By: Garald Balding M.D.   On: 09/19/2015 22:58   Dg Chest 2 View  09/16/2015  CLINICAL DATA:  Acute onset of generalized chest pain, nausea and vomiting. Initial encounter. EXAM: CHEST  2 VIEW COMPARISON:  Chest radiograph performed 09/08/2015 FINDINGS: The lungs are well-aerated. Vascular congestion is noted. Mild right-sided and left basilar airspace opacities could reflect mild interstitial edema or pneumonia. There is no evidence of pleural effusion or pneumothorax. The heart is enlarged. A right-sided dual-lumen catheter is noted ending at the right atrium. No acute osseous abnormalities are seen. IMPRESSION: Vascular congestion and cardiomegaly. Mild right-sided and left basilar airspace opacities could reflect mild interstitial edema or pneumonia. Electronically Signed   By: Garald Balding M.D.   On: 09/16/2015 03:02   Dg Chest 2 View  09/08/2015  CLINICAL DATA:  Chest pain radiating to the left arm. Shortness of  breath. Nausea and vomiting beginning tonight. Cough and congestion. EXAM: CHEST  2 VIEW COMPARISON:  09/01/2015 FINDINGS: Right central venous catheter with tip over the cavoatrial junction/upper RA. Diffuse cardiac enlargement. Mild pulmonary vascular congestion. No edema or consolidation. No blunting of costophrenic angles. No pneumothorax. IMPRESSION: Cardiac enlargement with mild pulmonary vascular congestion suggesting fluid overload. No edema or consolidation. Electronically Signed   By: Lucienne Capers M.D.   On: 09/08/2015 02:12   Dg Chest 2 View  09/02/2015  CLINICAL DATA:  50 year old male with shortness of breath and chest pain EXAM: CHEST  2 VIEW COMPARISON:  Radiograph dated 08/25/2015 FINDINGS: 50 year old catheter is noted with tip over the right atrium in stable positioning. There is stable cardiomegaly with mild vascular congestion. No focal consolidation, pleural effusion, or pneumothorax. No acute osseous pathology. IMPRESSION: No interval change. Electronically Signed   By: Anner Crete M.D.   On: 09/02/2015 00:02  Dg Chest Port 1v Same Day  09/23/2015  CLINICAL DATA:  Encounter for central line placement EXAM: PORTABLE CHEST 1 VIEW COMPARISON:  09/23/2015 FINDINGS: There is a right chest wall dialysis catheter with tips in the cavoatrial junction. No pneumothorax identified. Cardiac enlargement and pulmonary vascular congestion noted. IMPRESSION: 1. No complication after right chest wall dialysis catheter placement with tips in the cavoatrial junction. Electronically Signed   By: Kerby Moors M.D.   On: 09/23/2015 18:46   Dg Fluoro Guide Cv Line-no Report  09/23/2015  CLINICAL DATA:  FLOURO GUIDE CV LINE Fluoroscopy was utilized by the requesting physician.  No radiographic interpretation.    Microbiology: No results found for this or any previous visit (from the past 240 hour(s)).   Labs: Basic Metabolic Panel:  Recent Labs Lab 09/21/15 0736 09/23/15 1240  09/24/15 0324 09/26/15 1709  NA 137 140 138 136  K 4.9 5.1 4.1 6.0*  CL 101 99* 101 98*  CO2 19* _0 GLUCOSE 143* 138* 86 142*  BUN 76* 55* 24* 68*  CREATININE 13.26* 9.55* 5.77* 11.40*  CALCIUM 8.2* 8.6* 9.0 7.6*  PHOS  --  4.2 3.4 3.9   Liver Function Tests:  Recent Labs Lab 09/23/15 1240 09/24/15 0324 09/26/15 1709  ALBUMIN 2.8* 2.8* 2.7*   No results for input(s): LIPASE, AMYLASE in the last 168 hours. No results for input(s): AMMONIA in the last 168 hours. CBC:  Recent Labs Lab 09/21/15 0736 09/23/15 1240 09/25/15 0325 09/25/15 2150 09/26/15 0605 09/27/15 0602  WBC 7.1 6.3 5.9  --  6.1 7.2  NEUTROABS 5.9  --   --   --   --   --   HGB 7.3* 7.4* 6.9* 8.0* 7.6* 8.9*  HCT 21.8* 23.8* 21.3* 24.9* 22.8* 26.3*  MCV 87.2 88.5 88.0  --  86.7 88.3  PLT 142* 173 155  --  173 188   Cardiac Enzymes:  Recent Labs Lab 09/24/15 0324 09/24/15 1033 09/26/15 0615  TROPONINI 0.06* 0.06* 0.03   BNP: BNP (last 3 results)  Recent Labs  01/26/15 0421 07/17/15 2135 09/24/15 0324  BNP >4500.0* >4500.0* >4500.0*    ProBNP (last 3 results) No results for input(s): PROBNP in the last 8760 hours.  CBG:  Recent Labs Lab 09/25/15 1435 09/25/15 1455 09/25/15 1520 09/25/15 1809 09/25/15 2255  GLUCAP 55* 71 78 92 79       Signed:  [name] MD.  [facility] 09/27/2015, 10:53 AM

## 2015-09-27 NOTE — Progress Notes (Signed)
ANTICOAGULATION CONSULT NOTE - Follow Up Consult  Pharmacy Consult for coumadin Indication: Hx of PE.  Allergies  Allergen Reactions  . Butalbital-Apap-Caffeine Shortness Of Breath and Swelling    Swelling in throat  . Ferrlecit [Na Ferric Gluc Cplx In Sucrose] Shortness Of Breath, Swelling and Other (See Comments)    Swelling in throat  . Minoxidil Shortness Of Breath  . Darvocet [Propoxyphene N-Acetaminophen] Hives  . Propoxyphene Hives    Allergy listed on file from [place] (Pt did not mention any allergies)     Patient Measurements: Height: _0  (188 cm) Weight: 161 lb 9.6 oz (73.3 kg) (patient refuses to stand) IBW/kg (Calculated) : 82.2  Dosing weight = 75 kg  Vital Signs: Temp: 97.6 F (36.4 C) (03/08 1005) Temp Source: Oral (03/08 1005) BP: 139/82 mmHg (03/08 1005) Pulse Rate: 79 (03/08 1005)  Labs:  Recent Labs  09/25/15 0325 09/25/15 0941 09/25/15 1916 09/25/15 2150 09/26/15 0605 09/26/15 0615 09/26/15 1709 09/27/15 0602  HGB 6.9*  --   --  8.0* 7.6*  --   --  8.9*  HCT 21.3*  --   --  24.9* 22.8*  --   --  26.3*  PLT 155  --   --   --  173  --   --  188  LABPROT 19.8*  --   --   --   --  30.2*  --  33.6*  INR 1.68*  --   --   --   --  2.95*  --  3.40*  HEPARINUNFRC  --  <0.10* <0.10*  --   --  0.20*  --   --   CREATININE  --   --   --   --   --   --  11.40*  --   TROPONINI  --   --   --   --   --  0.03  --   --     Estimated Creatinine Clearance: 7.9 mL/min (by C-G formula based on Cr of 11.4).   Assessment: 50 yo M admitted 09/23/2015 on coumadin PTA for hx of PE. Pharmacy consulted to manage coumadin. He was on heparin (started 3/5) and this was discontinued on 3/7.   INR today remains SUPRAtherapeutic (INR 3.4 << 2.95, goal of 2-3), CBC stable today- no bleeding noted.   Goal of Therapy:  INR= 2-3 Monitor platelets by anticoagulation protocol: Yes   Plan -Hold coumadin again today  -Daily PT/INR  Alycia Rossetti, PharmD,  BCPS Clinical Pharmacist Pager: [phone] 09/27/2015 1:17 PM

## 2015-09-27 NOTE — Progress Notes (Signed)
[name] to be D/C'd Home per MD order.  Discussed prescriptions and follow up appointments with the patient. Prescriptions given to patient, medication list explained in detail. Pt verbalized understanding.    Medication List    TAKE these medications        ALPRAZolam 0.5 MG tablet  Commonly known as:  XANAX  Take 1 tablet (0.5 mg total) by mouth 2 (two) times daily as needed for anxiety.     amLODipine 10 MG tablet  Commonly known as:  NORVASC  Take 10 mg by mouth daily.     atorvastatin 40 MG tablet  Commonly known as:  LIPITOR  Take 1 tablet (40 mg total) by mouth daily at 6 PM.     carvedilol 6.25 MG tablet  Commonly known as:  COREG  Take 1 tablet (6.25 mg total) by mouth 2 (two) times daily with a meal.     cinacalcet 30 MG tablet  Commonly known as:  SENSIPAR  Take 30 mg by mouth daily.     docusate sodium 100 MG capsule  Commonly known as:  COLACE  Take 1 capsule (100 mg total) by mouth 2 (two) times daily.     doxercalciferol 4 MCG/2ML injection  Commonly known as:  HECTOROL  Inject 1.25 mLs (2.5 mcg total) into the vein Every Tuesday,Thursday,and Saturday with dialysis.     gabapentin 100 MG capsule  Commonly known as:  NEURONTIN  Take 100 mg by mouth 3 (three) times daily.     hydrALAZINE 50 MG tablet  Commonly known as:  APRESOLINE  Take 2 tablets (100 mg total) by mouth every 8 (eight) hours.     hydrocerin Crea  Apply 1 application topically 2 (two) times daily.     HYDROcodone-acetaminophen 5-325 MG tablet  Commonly known as:  NORCO/VICODIN  Take 1 tablet by mouth every 6 (six) hours as needed.     hydrOXYzine 10 MG tablet  Commonly known as:  ATARAX/VISTARIL  Take 1 tablet (10 mg total) by mouth 3 (three) times daily as needed for itching.     isosorbide mononitrate 30 MG 24 hr tablet  Commonly known as:  IMDUR  Take 3 tablets (90 mg total) by mouth daily.     levalbuterol 45 MCG/ACT inhaler  Commonly known as:  XOPENEX HFA  Inhale 2  puffs into the lungs every 6 (six) hours as needed for wheezing or shortness of breath.     lisinopril 20 MG tablet  Commonly known as:  PRINIVIL,ZESTRIL  Take 1 tablet (20 mg total) by mouth daily.     multivitamin Tabs tablet  Take 1 tablet by mouth at bedtime.     omeprazole 20 MG capsule  Commonly known as:  PRILOSEC  Take 20 mg by mouth daily.     predniSONE 5 MG tablet  Commonly known as:  DELTASONE  Take 5 mg by mouth daily with breakfast.     sevelamer carbonate 800 MG tablet  Commonly known as:  RENVELA  Take 800-1,600 mg by mouth 3 (three) times daily with meals. 2 tabs three times daily with meals, and 1 tablet with snacks     warfarin 6 MG tablet  Commonly known as:  COUMADIN  Take 1.5 tablets (9 mg total) by mouth daily at 6 PM. Take daily until adjusted per MD        Filed Vitals:   09/27/15 0628 09/27/15 1005  BP: 158/113 139/82  Pulse: 84 79  Temp: 98.5 F (  36.9 C) 97.6 F (36.4 C)  Resp: 19 16    Skin clean, dry and intact without evidence of skin break down, no evidence of skin tears noted. IV catheter discontinued intact. Site without signs and symptoms of complications. Dressing and pressure applied. Pt denies pain at this time. No complaints noted.  An After Visit Summary was printed and given to the patient. Patient ambulated off unit, and D/C home via private auto.  Retta Mac BSN, RN

## 2015-09-30 ENCOUNTER — Encounter (HOSPITAL_COMMUNITY): Payer: Self-pay

## 2015-09-30 ENCOUNTER — Non-Acute Institutional Stay (HOSPITAL_COMMUNITY)
Admission: EM | Admit: 2015-09-30 | Discharge: 2015-09-30 | Disposition: A | Discharge status: Home / Self Care | Payer: Medicare Other | Attending: Physician Assistant | Admitting: Physician Assistant

## 2015-09-30 DIAGNOSIS — R05 Cough: Secondary | ICD-10-CM | POA: Diagnosis not present

## 2015-09-30 DIAGNOSIS — E1122 Type 2 diabetes mellitus with diabetic chronic kidney disease: Secondary | ICD-10-CM | POA: Diagnosis not present

## 2015-09-30 DIAGNOSIS — I120 Hypertensive chronic kidney disease with stage 5 chronic kidney disease or end stage renal disease: Secondary | ICD-10-CM | POA: Diagnosis not present

## 2015-09-30 DIAGNOSIS — F329 Major depressive disorder, single episode, unspecified: Secondary | ICD-10-CM | POA: Diagnosis not present

## 2015-09-30 DIAGNOSIS — Z87891 Personal history of nicotine dependence: Secondary | ICD-10-CM | POA: Insufficient documentation

## 2015-09-30 DIAGNOSIS — Z7901 Long term (current) use of anticoagulants: Secondary | ICD-10-CM | POA: Insufficient documentation

## 2015-09-30 DIAGNOSIS — Z992 Dependence on renal dialysis: Secondary | ICD-10-CM | POA: Diagnosis not present

## 2015-09-30 DIAGNOSIS — N186 End stage renal disease: Secondary | ICD-10-CM | POA: Insufficient documentation

## 2015-09-30 LAB — COMPREHENSIVE METABOLIC PANEL
ALBUMIN: 3.2 g/dL — AB (ref 3.5–5.0)
ALK PHOS: 144 U/L — AB (ref 38–126)
ALT: 36 U/L (ref 17–63)
ANION GAP: 18 — AB (ref 5–15)
AST: 104 U/L — ABNORMAL HIGH (ref 15–41)
BILIRUBIN TOTAL: 0.7 mg/dL (ref 0.3–1.2)
BUN: 87 mg/dL — ABNORMAL HIGH (ref 6–20)
CALCIUM: 8.5 mg/dL — AB (ref 8.9–10.3)
CO2: 20 mmol/L — ABNORMAL LOW (ref 22–32)
Chloride: 99 mmol/L — ABNORMAL LOW (ref 101–111)
Creatinine, Ser: 13.97 mg/dL — ABNORMAL HIGH (ref 0.61–1.24)
GFR calc non Af Amer: 4 mL/min — ABNORMAL LOW (ref >60)
GFR, EST AFRICAN AMERICAN: 4 mL/min — AB (ref >60)
Glucose, Bld: 76 mg/dL (ref 65–99)
POTASSIUM: 5.9 mmol/L — AB (ref 3.5–5.1)
Sodium: 137 mmol/L (ref 135–145)
TOTAL PROTEIN: 8 g/dL (ref 6.5–8.1)

## 2015-09-30 LAB — CBC WITH DIFFERENTIAL/PLATELET
BASOS ABS: 0 10*3/uL (ref 0.0–0.1)
Basophils Relative: 0 %
EOS ABS: 0.4 10*3/uL (ref 0.0–0.7)
Eosinophils Relative: 6 %
HEMATOCRIT: 28.9 % — AB (ref 39.0–52.0)
HEMOGLOBIN: 9.3 g/dL — AB (ref 13.0–17.0)
LYMPHS PCT: 15 %
Lymphs Abs: 1.1 10*3/uL (ref 0.7–4.0)
MCH: 29 pg (ref 26.0–34.0)
MCHC: 32.2 g/dL (ref 30.0–36.0)
MCV: 90 fL (ref 78.0–100.0)
MONOS PCT: 16 %
Monocytes Absolute: 1.1 10*3/uL — ABNORMAL HIGH (ref 0.1–1.0)
NEUTROS PCT: 63 %
Neutro Abs: 4.4 10*3/uL (ref 1.7–7.7)
Platelets: 180 10*3/uL (ref 150–400)
RBC: 3.21 MIL/uL — AB (ref 4.22–5.81)
RDW: 17 % — ABNORMAL HIGH (ref 11.5–15.5)
WBC: 7 10*3/uL (ref 4.0–10.5)

## 2015-09-30 MED ORDER — SODIUM CHLORIDE 0.9 % IV SOLN: 100.0000 mL | INTRAVENOUS | Status: DC | PRN

## 2015-09-30 MED ORDER — LIDOCAINE-PRILOCAINE 2.5-2.5 % EX CREA
1.0000 | TOPICAL_CREAM | CUTANEOUS | Status: DC | PRN
Start: 2015-09-30 — End: 2015-09-30

## 2015-09-30 MED ORDER — ONDANSETRON HCL 4 MG PO TABS
4.0000 mg | ORAL_TABLET | Freq: Once | ORAL | Status: AC
  Administered 2015-09-30: 4 mg via ORAL
  Filled 2015-09-30: qty 1

## 2015-09-30 MED ORDER — LIDOCAINE HCL (PF) 1 % IJ SOLN
5.0000 mL | INTRAMUSCULAR | Status: DC | PRN
Start: 2015-09-30 — End: 2015-09-30

## 2015-09-30 MED ORDER — HEPARIN SODIUM (PORCINE) 1000 UNIT/ML DIALYSIS
3500.0000 [IU] | INTRAMUSCULAR | Status: DC | PRN
Start: 2015-09-30 — End: 2015-09-30

## 2015-09-30 MED ORDER — LIDOCAINE-PRILOCAINE 2.5-2.5 % EX CREA: 1.0000 "application " | TOPICAL_CREAM | CUTANEOUS | Status: DC | PRN

## 2015-09-30 MED ORDER — HEPARIN SODIUM (PORCINE) 1000 UNIT/ML DIALYSIS
1000.0000 [IU] | INTRAMUSCULAR | Status: DC | PRN
Start: 2015-09-30 — End: 2015-09-30

## 2015-09-30 MED ORDER — HEPARIN SODIUM (PORCINE) 1000 UNIT/ML DIALYSIS
3000.0000 [IU] | INTRAMUSCULAR | Status: DC | PRN
Start: 2015-09-30 — End: 2015-09-30

## 2015-09-30 MED ORDER — ALTEPLASE 2 MG IJ SOLR: 2.0000 mg | Freq: Once | INTRAMUSCULAR | Status: DC | PRN

## 2015-09-30 MED ORDER — LIDOCAINE HCL (PF) 1 % IJ SOLN: 5.0000 mL | INTRAMUSCULAR | Status: DC | PRN

## 2015-09-30 MED ORDER — HEPARIN SODIUM (PORCINE) 1000 UNIT/ML DIALYSIS: 1000.0000 [IU] | INTRAMUSCULAR | Status: DC | PRN

## 2015-09-30 MED ORDER — PENTAFLUOROPROP-TETRAFLUOROETH EX AERO
1.0000 | INHALATION_SPRAY | CUTANEOUS | Status: DC | PRN
Start: 2015-09-30 — End: 2015-09-30

## 2015-09-30 NOTE — Procedures (Signed)
Patient is a [facility] patient in [facility], however his transportation "blew up" a couple of weeks ago and he hasn't had a way to get to his HD unit. So currently per the patient he doesn't have any way to get to outpatient dialysis so is coming to the hospital for dialysis.  He presented to ED with SOB, malaise and labs show K 5.9, creat 13.97 and Hb 9.3.  Asked to see for HD.  On HD now and will be ready for dc after dialysis. Exam showed no resp distress, +JVD, 1-2+ ankle edema, scattered basilar lung crackles.  Alert and Ox 3.       I was present at this dialysis session, have reviewed the session itself and made  appropriate changes Kelly Splinter MD [place] pager [phone]    cell [phone] 09/30/2015, 7:28 PM

## 2015-09-30 NOTE — ED Notes (Signed)
Pt given ice.

## 2015-09-30 NOTE — ED Notes (Signed)
Pt. Is here for dialysis, he is over 17 lbs.  He is also sob with scrotum pain. And he has been having n/v.  He reports his dialysis appointment is 800 am.

## 2015-09-30 NOTE — ED Notes (Signed)
Meal tray ordered for patient per request.

## 2015-09-30 NOTE — ED Provider Notes (Signed)
CSN: [id number]     Arrival date & time 09/30/15  3559 History   First MD Initiated Contact with Patient 09/30/15 0919     Chief Complaint  Patient presents with  . Shortness of Breath     (Consider location/radiation/quality/duration/timing/severity/associated sxs/prior Treatment) HPI  Patient is a 50 year old male with history of hypertension, depression, diabetes, end-stage renal disease on dialysis. He is presenting today with shortness of breath and for dialysis. Patient reports that he's been coming here to the hospital for dialysis because he is unable to get to his dialysis session. Patient reports his last dialysis was Tuesday. He reports he's had increased swelling in his scrotum and shortness of breath since then.  Patient has nausea and pain everywhere which is baseline for patient.   Past Medical History  Diagnosis Date  . Hypertension   . Depression   . Complication of anesthesia     itching, sore throat  . Diabetes mellitus without complication ([place])     No history per patient, but remains under history as A1c would not be accurate given on dialysis  . Shortness of breath   . Anxiety   . ESRD (end stage renal disease) ([place])     due to HTN per patient, followed at [facility], s/p failed kidney transplant - dialysis Tue, Th, Sat  . Renal insufficiency   . CHF (congestive heart failure) ([place])   . Anemia   . Dialysis patient [facility])    Past Surgical History  Procedure Laterality Date  . Kidney receipient  2006    failed and started HD in March 2014  . Capd insertion    . Capd removal    . Left heart catheterization with coronary angiogram N/A 09/02/2014    Procedure: LEFT HEART CATHETERIZATION WITH CORONARY ANGIOGRAM;  Surgeon: [name], MD;  Location: [facility] CATH LAB;  Service: Cardiovascular;  Laterality: N/A;  . Inguinal hernia repair Right 02/14/2015    Procedure: REPAIR INCARCERATED RIGHT INGUINAL HERNIA;  Surgeon: [name], MD;  Location: [place];  Service:  General;  Laterality: Right;  . Insertion of dialysis catheter Right 09/23/2015    Procedure: exchange of Right internal Dialysis Catheter.;  Surgeon: [name], MD;  Location: [facility] OR;  Service: Vascular;  Laterality: Right;   Family History  Problem Relation Age of Onset  . Hypertension Other    Social History  Substance Use Topics  . Smoking status: Former Smoker -- 0.00 packs/day for 1 years    Types: Cigarettes  . Smokeless tobacco: Never Used     Comment: quit Jan 2014  . Alcohol Use: No    Review of Systems  Constitutional: Negative for fever and activity change.  HENT: Negative for congestion.   Respiratory: Positive for cough and shortness of breath.   Cardiovascular: Negative for chest pain.  Allergic/Immunologic: Negative for immunocompromised state.  Neurological: Negative for seizures and speech difficulty.  All other systems reviewed and are negative.     Allergies  Butalbital-apap-caffeine; Ferrlecit; Minoxidil; Darvocet; and Propoxyphene  Home Medications   Prior to Admission medications   Medication Sig Start Date End Date Taking? Authorizing Provider  ALPRAZolam [name]) 0.5 MG tablet Take 1 tablet (0.5 mg total) by mouth 2 (two) times daily as needed for anxiety. 09/11/15   [name], MD  amLODipine (NORVASC) 10 MG tablet Take 10 mg by mouth daily.    Historical Provider, MD  atorvastatin (LIPITOR) 40 MG tablet Take 1 tablet (40 mg total) by mouth  daily at 6 PM. 09/02/14   Barton Dubois, MD  carvedilol (COREG) 6.25 MG tablet Take 1 tablet (6.25 mg total) by mouth 2 (two) times daily with a meal. 09/27/15   [name], MD  cinacalcet (SENSIPAR) 30 MG tablet Take 30 mg by mouth daily.    Historical Provider, MD  docusate sodium (COLACE) 100 MG capsule Take 1 capsule (100 mg total) by mouth 2 (two) times daily. 11/19/14   [name], MD  doxercalciferol (HECTOROL) 4 MCG/2ML injection Inject 1.25 mLs (2.5 mcg total) into the vein Every  Tuesday,Thursday,and Saturday with dialysis. 10/10/14   [name], DO  gabapentin (NEURONTIN) 100 MG capsule Take 100 mg by mouth 3 (three) times daily. 06/14/15 09/21/15  Historical Provider, MD  hydrALAZINE (APRESOLINE) 50 MG tablet Take 2 tablets (100 mg total) by mouth every 8 (eight) hours. 09/11/15   [name], MD  hydrocerin (EUCERIN) CREA Apply 1 application topically 2 (two) times daily. 02/04/15   [name], MD  HYDROcodone-acetaminophen (NORCO/VICODIN) 5-325 MG tablet Take 1 tablet by mouth every 6 (six) hours as needed. 09/27/15   [name], MD  hydrOXYzine (ATARAX/VISTARIL) 10 MG tablet Take 1 tablet (10 mg total) by mouth 3 (three) times daily as needed for itching. 09/27/15   [name], MD  isosorbide mononitrate (IMDUR) 30 MG 24 hr tablet Take 3 tablets (90 mg total) by mouth daily. 09/11/15   [name], MD  levalbuterol [facility] HFA) 45 MCG/ACT inhaler Inhale 2 puffs into the lungs every 6 (six) hours as needed for wheezing or shortness of breath. 11/19/14   [name], MD  lisinopril (PRINIVIL,ZESTRIL) 20 MG tablet Take 1 tablet (20 mg total) by mouth daily. 02/04/15   [name], MD  multivitamin (RENA-VIT) TABS tablet Take 1 tablet by mouth at bedtime. 02/04/15   [name], MD  omeprazole (PRILOSEC) 20 MG capsule Take 20 mg by mouth daily. 07/01/13   Historical Provider, MD  predniSONE (DELTASONE) 5 MG tablet Take 5 mg by mouth daily with breakfast.    Historical Provider, MD  sevelamer carbonate (RENVELA) 800 MG tablet Take 800-1,600 mg by mouth 3 (three) times daily with meals. 2 tabs three times daily with meals, and 1 tablet with snacks    Historical Provider, MD  warfarin (COUMADIN) 6 MG tablet Take 1.5 tablets (9 mg total) by mouth daily at 6 PM. Take daily until adjusted per MD 09/12/15   [name], MD   BP 185/130 mmHg  Pulse 88  Temp(Src) 98.9 F (37.2 C) (Oral)  Resp 20  Ht _0  (1.88 m)  Wt 182 lb 12.8 oz (82.918 kg)   BMI 23.46 kg/m2  SpO2 99% Physical Exam  Constitutional: He is oriented to person, place, and time. He appears well-nourished.  HENT:  Head: Normocephalic.  Mouth/Throat: Oropharynx is clear and moist.  Eyes: Conjunctivae are normal.  Neck: No tracheal deviation present.  Cardiovascular: Normal rate.   Pulmonary/Chest: Effort normal. No stridor. He has rales.  Abdominal: Soft. There is no tenderness. There is no guarding.  Genitourinary:  Scrotal edema  Musculoskeletal: Normal range of motion. He exhibits edema.  Neurological: He is oriented to person, place, and time. No cranial nerve deficit.  Skin: Skin is warm and dry. No rash noted. He is not diaphoretic.  Psychiatric:  Pt uncooperative with nursing staff  Nursing note and vitals reviewed.   ED Course  Procedures (including critical care time)  Labs Review Labs Reviewed  COMPREHENSIVE METABOLIC PANEL - Abnormal; Notable for the following:    Potassium 5.9 (*)    Chloride 99 (*)    CO2 20 (*)    BUN 87 (*)    Creatinine, Ser 13.97 (*)    Calcium 8.5 (*)    Albumin 3.2 (*)    AST 104 (*)    Alkaline Phosphatase 144 (*)    GFR calc non Af Amer 4 (*)    GFR calc Af Amer 4 (*)    Anion gap 18 (*)    All other components within normal limits  CBC WITH DIFFERENTIAL/PLATELET - Abnormal; Notable for the following:    RBC 3.21 (*)    Hemoglobin 9.3 (*)    HCT 28.9 (*)    RDW 17.0 (*)    Monocytes Absolute 1.1 (*)    All other components within normal limits    Imaging Review No results found. I have personally reviewed and evaluated these images and lab results as part of my medical decision-making.   EKG Interpretation None      MDM   Final diagnoses:  None  Patient is a 50 year old male with history of end-stage renal disease on dialysis presenting today with dialysis needs. Patient reports he comes here to the hospital when he needs dialysis. He has not had dialysis since Tuesday. He reports increasing  shortness of breath.  Patient not requiring oxygen at this time. Minimal rales.Burnis Medin touch base with dialysis here in the hospital.  12:23 PM DIalysis aware he needs dialysis.  There is no indication for admission.     Courteney Julio Alm, MD 09/30/15 1223

## 2015-09-30 NOTE — Progress Notes (Signed)
Patient arrived to unit by ED stretcher.  Reviewed treatment plan and this RN agrees with plan.  Report received from bedside RN, Vicente Males.  Consent verified.  Patient A&OX4.   Lung sounds clear, diminished to ausculation in all fields. Generazlied edema. Cardiac:  Regular R&R.  Removed caps and cleansed RIJ catheter with chlorhedxidine.  Aspirated ports of heparin and flushed them with saline per protocol.  Connected and secured lines, initiated treatment at 1459.  UF Goal of 5032m and net fluid removal 4.5L.  Will continue to monitor.

## 2015-09-30 NOTE — Progress Notes (Signed)
Dialysis treatment completed.  5000 mL ultrafiltrated.  4500 mL net fluid removal.  Patient status unchanged. Lung sounds clear to ausculation in all fields. No edema. Cardiac: Regular R&R.  Cleansed RIJ catheter with chlorhexidine.  Disconnected lines and flushed ports with saline per protocol.  Ports locked with heparin and capped per protocol.    Pt discharged after treatment completed, per nephrologist.

## 2015-09-30 NOTE — ED Notes (Signed)
Pt states he does not have a dialysis center and comes here for dialysis. Pt refused to get off of the phone for an assessment.

## 2015-09-30 NOTE — ED Notes (Signed)
Pt given sandwich bag per pt request and beverage. Pt states "They normally have a breakfast tray waiting for me but you missed breakfast."

## 2015-09-30 NOTE — ED Notes (Signed)
Pt given warm blanket.

## 2015-10-03 ENCOUNTER — Encounter (HOSPITAL_COMMUNITY): Payer: Self-pay

## 2015-10-03 ENCOUNTER — Emergency Department (HOSPITAL_COMMUNITY)
Admission: EM | Admit: 2015-10-03 | Discharge: 2015-10-03 | Disposition: A | Discharge status: Home / Self Care | Payer: Medicare Other | Attending: Emergency Medicine | Admitting: Emergency Medicine

## 2015-10-03 DIAGNOSIS — F329 Major depressive disorder, single episode, unspecified: Secondary | ICD-10-CM | POA: Diagnosis not present

## 2015-10-03 DIAGNOSIS — R112 Nausea with vomiting, unspecified: Secondary | ICD-10-CM | POA: Insufficient documentation

## 2015-10-03 DIAGNOSIS — I120 Hypertensive chronic kidney disease with stage 5 chronic kidney disease or end stage renal disease: Secondary | ICD-10-CM | POA: Insufficient documentation

## 2015-10-03 DIAGNOSIS — Z862 Personal history of diseases of the blood and blood-forming organs and certain disorders involving the immune mechanism: Secondary | ICD-10-CM | POA: Diagnosis not present

## 2015-10-03 DIAGNOSIS — F419 Anxiety disorder, unspecified: Secondary | ICD-10-CM | POA: Insufficient documentation

## 2015-10-03 DIAGNOSIS — Z79899 Other long term (current) drug therapy: Secondary | ICD-10-CM | POA: Insufficient documentation

## 2015-10-03 DIAGNOSIS — Z7901 Long term (current) use of anticoagulants: Secondary | ICD-10-CM | POA: Insufficient documentation

## 2015-10-03 DIAGNOSIS — R140 Abdominal distension (gaseous): Secondary | ICD-10-CM | POA: Diagnosis not present

## 2015-10-03 DIAGNOSIS — D631 Anemia in chronic kidney disease: Secondary | ICD-10-CM | POA: Diagnosis not present

## 2015-10-03 DIAGNOSIS — R05 Cough: Secondary | ICD-10-CM | POA: Diagnosis not present

## 2015-10-03 DIAGNOSIS — Z418 Encounter for other procedures for purposes other than remedying health state: Secondary | ICD-10-CM | POA: Diagnosis not present

## 2015-10-03 DIAGNOSIS — Z7952 Long term (current) use of systemic steroids: Secondary | ICD-10-CM | POA: Insufficient documentation

## 2015-10-03 DIAGNOSIS — Z87891 Personal history of nicotine dependence: Secondary | ICD-10-CM | POA: Diagnosis not present

## 2015-10-03 DIAGNOSIS — I132 Hypertensive heart and chronic kidney disease with heart failure and with stage 5 chronic kidney disease, or end stage renal disease: Secondary | ICD-10-CM | POA: Diagnosis not present

## 2015-10-03 DIAGNOSIS — R079 Chest pain, unspecified: Secondary | ICD-10-CM | POA: Diagnosis not present

## 2015-10-03 DIAGNOSIS — N2581 Secondary hyperparathyroidism of renal origin: Secondary | ICD-10-CM | POA: Diagnosis not present

## 2015-10-03 DIAGNOSIS — E1139 Type 2 diabetes mellitus with other diabetic ophthalmic complication: Secondary | ICD-10-CM | POA: Diagnosis not present

## 2015-10-03 DIAGNOSIS — I509 Heart failure, unspecified: Secondary | ICD-10-CM | POA: Insufficient documentation

## 2015-10-03 DIAGNOSIS — E119 Type 2 diabetes mellitus without complications: Secondary | ICD-10-CM | POA: Insufficient documentation

## 2015-10-03 DIAGNOSIS — G4733 Obstructive sleep apnea (adult) (pediatric): Secondary | ICD-10-CM | POA: Diagnosis not present

## 2015-10-03 DIAGNOSIS — N186 End stage renal disease: Secondary | ICD-10-CM

## 2015-10-03 DIAGNOSIS — Z992 Dependence on renal dialysis: Secondary | ICD-10-CM | POA: Diagnosis not present

## 2015-10-03 DIAGNOSIS — Z940 Kidney transplant status: Secondary | ICD-10-CM | POA: Diagnosis not present

## 2015-10-03 LAB — BASIC METABOLIC PANEL WITH GFR
Anion gap: 19 — ABNORMAL HIGH (ref 5–15)
BUN: 75 mg/dL — ABNORMAL HIGH (ref 6–20)
CO2: 21 mmol/L — ABNORMAL LOW (ref 22–32)
Calcium: 8 mg/dL — ABNORMAL LOW (ref 8.9–10.3)
Chloride: 99 mmol/L — ABNORMAL LOW (ref 101–111)
Creatinine, Ser: 13.83 mg/dL — ABNORMAL HIGH (ref 0.61–1.24)
GFR calc Af Amer: 4 mL/min — ABNORMAL LOW
GFR calc non Af Amer: 4 mL/min — ABNORMAL LOW
Glucose, Bld: 130 mg/dL — ABNORMAL HIGH (ref 65–99)
Potassium: 5 mmol/L (ref 3.5–5.1)
Sodium: 139 mmol/L (ref 135–145)

## 2015-10-03 MED ORDER — ONDANSETRON 4 MG PO TBDP
4.0000 mg | ORAL_TABLET | Freq: Once | ORAL | Status: AC
  Administered 2015-10-03: 4 mg via ORAL
  Filled 2015-10-03: qty 1

## 2015-10-03 NOTE — Progress Notes (Signed)
Called by ED Dr. Regenia Skeeter pt presented for routine HD.  His labs show K 5.0, BP ok on RA.  He states no transport to his outpt HD unit but has spot today at 12:30pm at [facility].    Given no emergent need for HD he needs to contact his HD unit, work with Education officer, museum, and arrange transportation.  Given that he does have an outpatient dialysis unit, he will not rec HD unless emergent indication.

## 2015-10-03 NOTE — Discharge Instructions (Signed)
Call the social worker at your dialysis center in [facility] to help arrange transportation.

## 2015-10-03 NOTE — ED Notes (Signed)
Patient here for dialysis, no complaints. Last treatment saturday

## 2015-10-03 NOTE — ED Provider Notes (Signed)
CSN: [id number]     Arrival date & time 10/03/15  0741 History   First MD Initiated Contact with Patient 10/03/15 [phone]     Chief Complaint  Patient presents with  . dialysis      (Consider location/radiation/quality/duration/timing/severity/associated sxs/prior Treatment) HPI  50 year old male presents for dialysis. Today is Tuesday, last had dialysis on Saturday. Patient states overnight he has developed nausea, vomiting 4 times, and abdominal pain. Last bowel movement was last night and was normal. No hematemesis. He states it is typical for him to get nausea, vomiting, and pain whenever he is due for dialysis. No shortness of breath. No fevers. No chest pain or other complaints. He states he has had this N/V multiple times with abd pain and dialysis always fixes it.  Past Medical History  Diagnosis Date  . Hypertension   . Depression   . Complication of anesthesia     itching, sore throat  . Diabetes mellitus without complication ([place])     No history per patient, but remains under history as A1c would not be accurate given on dialysis  . Shortness of breath   . Anxiety   . ESRD (end stage renal disease) ([place])     due to HTN per patient, followed at [facility], s/p failed kidney transplant - dialysis Tue, Th, Sat  . Renal insufficiency   . CHF (congestive heart failure) ([place])   . Anemia   . Dialysis patient [facility])    Past Surgical History  Procedure Laterality Date  . Kidney receipient  2006    failed and started HD in March 2014  . Capd insertion    . Capd removal    . Left heart catheterization with coronary angiogram N/A 09/02/2014    Procedure: LEFT HEART CATHETERIZATION WITH CORONARY ANGIOGRAM;  Surgeon: [name], MD;  Location: [facility] CATH LAB;  Service: Cardiovascular;  Laterality: N/A;  . Inguinal hernia repair Right 02/14/2015    Procedure: REPAIR INCARCERATED RIGHT INGUINAL HERNIA;  Surgeon: [name], MD;  Location: [place];  Service: General;  Laterality: Right;  .  Insertion of dialysis catheter Right 09/23/2015    Procedure: exchange of Right internal Dialysis Catheter.;  Surgeon: [name], MD;  Location: [facility] OR;  Service: Vascular;  Laterality: Right;   Family History  Problem Relation Age of Onset  . Hypertension Other    Social History  Substance Use Topics  . Smoking status: Former Smoker -- 0.00 packs/day for 1 years    Types: Cigarettes  . Smokeless tobacco: Never Used     Comment: quit Jan 2014  . Alcohol Use: No    Review of Systems  Constitutional: Negative for fever.  Respiratory: Negative for shortness of breath.   Gastrointestinal: Positive for nausea, vomiting, abdominal pain and abdominal distention. Negative for diarrhea and constipation.  All other systems reviewed and are negative.     Allergies  Butalbital-apap-caffeine; Ferrlecit; Minoxidil; Darvocet; and Propoxyphene  Home Medications   Prior to Admission medications   Medication Sig Start Date End Date Taking? Authorizing Provider  ALPRAZolam [name]) 0.5 MG tablet Take 1 tablet (0.5 mg total) by mouth 2 (two) times daily as needed for anxiety. 09/11/15   [name], MD  amLODipine (NORVASC) 10 MG tablet Take 10 mg by mouth daily.    Historical Provider, MD  atorvastatin (LIPITOR) 40 MG tablet Take 1 tablet (40 mg total) by mouth daily at 6 PM. 09/02/14   Barton Dubois, MD  carvedilol (COREG) 6.25 MG  tablet Take 1 tablet (6.25 mg total) by mouth 2 (two) times daily with a meal. 09/27/15   [name], MD  cinacalcet (SENSIPAR) 30 MG tablet Take 30 mg by mouth daily.    Historical Provider, MD  docusate sodium (COLACE) 100 MG capsule Take 1 capsule (100 mg total) by mouth 2 (two) times daily. 11/19/14   [name], MD  doxercalciferol (HECTOROL) 4 MCG/2ML injection Inject 1.25 mLs (2.5 mcg total) into the vein Every Tuesday,Thursday,and Saturday with dialysis. 10/10/14   [name], DO  gabapentin (NEURONTIN) 100 MG capsule Take 100 mg by mouth 3  (three) times daily. 06/14/15 09/21/15  Historical Provider, MD  hydrALAZINE (APRESOLINE) 50 MG tablet Take 2 tablets (100 mg total) by mouth every 8 (eight) hours. 09/11/15   [name], MD  hydrocerin (EUCERIN) CREA Apply 1 application topically 2 (two) times daily. 02/04/15   [name], MD  HYDROcodone-acetaminophen (NORCO/VICODIN) 5-325 MG tablet Take 1 tablet by mouth every 6 (six) hours as needed. 09/27/15   [name], MD  hydrOXYzine (ATARAX/VISTARIL) 10 MG tablet Take 1 tablet (10 mg total) by mouth 3 (three) times daily as needed for itching. 09/27/15   [name], MD  isosorbide mononitrate (IMDUR) 30 MG 24 hr tablet Take 3 tablets (90 mg total) by mouth daily. 09/11/15   [name], MD  levalbuterol [name] HFA) 45 MCG/ACT inhaler Inhale 2 puffs into the lungs every 6 (six) hours as needed for wheezing or shortness of breath. 11/19/14   [name], MD  lisinopril (PRINIVIL,ZESTRIL) 20 MG tablet Take 1 tablet (20 mg total) by mouth daily. 02/04/15   [name], MD  multivitamin (RENA-VIT) TABS tablet Take 1 tablet by mouth at bedtime. 02/04/15   [name], MD  omeprazole (PRILOSEC) 20 MG capsule Take 20 mg by mouth daily. 07/01/13   Historical Provider, MD  predniSONE (DELTASONE) 5 MG tablet Take 5 mg by mouth daily with breakfast.    Historical Provider, MD  sevelamer carbonate (RENVELA) 800 MG tablet Take 800-1,600 mg by mouth 3 (three) times daily with meals. 2 tabs three times daily with meals, and 1 tablet with snacks    Historical Provider, MD  warfarin (COUMADIN) 6 MG tablet Take 1.5 tablets (9 mg total) by mouth daily at 6 PM. Take daily until adjusted per MD 09/12/15   [name], MD   BP 168/121 mmHg  Pulse 83  Temp(Src) 97.6 F (36.4 C) (Oral)  Resp 20  Ht 6' 2" (1.88 m)  Wt 174 lb (78.926 kg)  BMI 22.33 kg/m2  SpO2 100% Physical Exam  Constitutional: He is oriented to person, place, and time. He appears well-developed and  well-nourished.  HENT:  Head: Normocephalic and atraumatic.  Right Ear: External ear normal.  Left Ear: External ear normal.  Nose: Nose normal.  Eyes: Right eye exhibits no discharge. Left eye exhibits no discharge.  Neck: Neck supple.  Cardiovascular: Normal rate, regular rhythm, normal heart sounds and intact distal pulses.   Pulmonary/Chest: Effort normal and breath sounds normal.  Abdominal: Soft. He exhibits no distension. There is tenderness.  Musculoskeletal: He exhibits no edema.  Neurological: He is alert and oriented to person, place, and time.  Skin: Skin is warm and dry.  Nursing note and vitals reviewed.   ED Course  Procedures (including critical care time) Labs Review Labs Reviewed  BASIC METABOLIC PANEL - Abnormal; Notable for the following:  Chloride 99 (*)    CO2 21 (*)    Glucose, Bld 130 (*)    BUN 75 (*)    Creatinine, Ser 13.83 (*)    Calcium 8.0 (*)    GFR calc non Af Amer 4 (*)    GFR calc Af Amer 4 (*)    Anion gap 19 (*)    All other components within normal limits    Imaging Review No results found. I have personally reviewed and evaluated these images and lab results as part of my medical decision-making.   EKG Interpretation None      MDM   Final diagnoses:  ESRD (end stage renal disease) ([place])    Patient is in no acute distress. Has not had any vomiting while in the ED. No dyspnea or signs of volume overload. Electrolytes are unremarkable. Discussed with renal, Dr. Joelyn Oms. Patient apparently has an open dialysis spot today at 12:30 PM [facility]. Patient tells me he is unable to get there. After talking with social work and nephrology, he should be able to easily contact the Education officer, museum at [employer] and they should be a little arrange transport form given he is Medicare. Patient has no emergent need for dialysis. Patient tells me that his car is broken down but Dr. Joelyn Oms tells me this has been an issue for him for 6 months. He  does not think patient needs emergent dialysis and will not dialyze him today. Discussed this with the patient who agrees to go to [employer] and call their Education officer, museum.    Sherwood Gambler, MD 10/03/15 1113

## 2015-10-04 DIAGNOSIS — I498 Other specified cardiac arrhythmias: Secondary | ICD-10-CM | POA: Diagnosis not present

## 2015-10-04 DIAGNOSIS — I4581 Long QT syndrome: Secondary | ICD-10-CM | POA: Diagnosis not present

## 2015-10-05 ENCOUNTER — Encounter (HOSPITAL_COMMUNITY): Payer: Self-pay

## 2015-10-05 DIAGNOSIS — E1165 Type 2 diabetes mellitus with hyperglycemia: Secondary | ICD-10-CM | POA: Diagnosis not present

## 2015-10-05 DIAGNOSIS — E11649 Type 2 diabetes mellitus with hypoglycemia without coma: Secondary | ICD-10-CM | POA: Diagnosis not present

## 2015-10-05 DIAGNOSIS — I4581 Long QT syndrome: Secondary | ICD-10-CM | POA: Diagnosis not present

## 2015-10-05 DIAGNOSIS — I2782 Chronic pulmonary embolism: Secondary | ICD-10-CM | POA: Diagnosis not present

## 2015-10-05 DIAGNOSIS — F329 Major depressive disorder, single episode, unspecified: Secondary | ICD-10-CM | POA: Diagnosis not present

## 2015-10-05 DIAGNOSIS — R112 Nausea with vomiting, unspecified: Secondary | ICD-10-CM | POA: Diagnosis not present

## 2015-10-05 DIAGNOSIS — N186 End stage renal disease: Secondary | ICD-10-CM | POA: Insufficient documentation

## 2015-10-05 DIAGNOSIS — I132 Hypertensive heart and chronic kidney disease with heart failure and with stage 5 chronic kidney disease, or end stage renal disease: Secondary | ICD-10-CM | POA: Diagnosis not present

## 2015-10-05 DIAGNOSIS — E875 Hyperkalemia: Principal | ICD-10-CM | POA: Insufficient documentation

## 2015-10-05 DIAGNOSIS — Z794 Long term (current) use of insulin: Secondary | ICD-10-CM | POA: Insufficient documentation

## 2015-10-05 DIAGNOSIS — D631 Anemia in chronic kidney disease: Secondary | ICD-10-CM | POA: Insufficient documentation

## 2015-10-05 DIAGNOSIS — I509 Heart failure, unspecified: Secondary | ICD-10-CM | POA: Diagnosis not present

## 2015-10-05 DIAGNOSIS — R0602 Shortness of breath: Secondary | ICD-10-CM | POA: Diagnosis not present

## 2015-10-05 DIAGNOSIS — Z9115 Patient's noncompliance with renal dialysis: Secondary | ICD-10-CM | POA: Insufficient documentation

## 2015-10-05 DIAGNOSIS — I429 Cardiomyopathy, unspecified: Secondary | ICD-10-CM | POA: Insufficient documentation

## 2015-10-05 DIAGNOSIS — E1122 Type 2 diabetes mellitus with diabetic chronic kidney disease: Secondary | ICD-10-CM | POA: Diagnosis not present

## 2015-10-05 DIAGNOSIS — Z7901 Long term (current) use of anticoagulants: Secondary | ICD-10-CM | POA: Diagnosis not present

## 2015-10-05 DIAGNOSIS — I120 Hypertensive chronic kidney disease with stage 5 chronic kidney disease or end stage renal disease: Secondary | ICD-10-CM | POA: Diagnosis not present

## 2015-10-05 DIAGNOSIS — Z992 Dependence on renal dialysis: Secondary | ICD-10-CM | POA: Insufficient documentation

## 2015-10-05 DIAGNOSIS — E785 Hyperlipidemia, unspecified: Secondary | ICD-10-CM | POA: Diagnosis not present

## 2015-10-05 DIAGNOSIS — F419 Anxiety disorder, unspecified: Secondary | ICD-10-CM | POA: Diagnosis not present

## 2015-10-05 DIAGNOSIS — I498 Other specified cardiac arrhythmias: Secondary | ICD-10-CM | POA: Diagnosis not present

## 2015-10-05 DIAGNOSIS — M62830 Muscle spasm of back: Secondary | ICD-10-CM | POA: Diagnosis not present

## 2015-10-05 DIAGNOSIS — Z87891 Personal history of nicotine dependence: Secondary | ICD-10-CM | POA: Insufficient documentation

## 2015-10-05 DIAGNOSIS — R200 Anesthesia of skin: Secondary | ICD-10-CM | POA: Diagnosis not present

## 2015-10-05 DIAGNOSIS — R079 Chest pain, unspecified: Secondary | ICD-10-CM | POA: Insufficient documentation

## 2015-10-05 DIAGNOSIS — M62838 Other muscle spasm: Secondary | ICD-10-CM | POA: Diagnosis not present

## 2015-10-05 DIAGNOSIS — M5440 Lumbago with sciatica, unspecified side: Secondary | ICD-10-CM | POA: Diagnosis not present

## 2015-10-05 DIAGNOSIS — Z79899 Other long term (current) drug therapy: Secondary | ICD-10-CM | POA: Diagnosis not present

## 2015-10-05 DIAGNOSIS — E119 Type 2 diabetes mellitus without complications: Secondary | ICD-10-CM | POA: Diagnosis not present

## 2015-10-05 LAB — I-STAT CHEM 8, ED
BUN: 73 mg/dL — AB (ref 6–20)
CALCIUM ION: 0.97 mmol/L — AB (ref 1.12–1.23)
Chloride: 103 mmol/L (ref 101–111)
Creatinine, Ser: 15.8 mg/dL — ABNORMAL HIGH (ref 0.61–1.24)
GLUCOSE: 92 mg/dL (ref 65–99)
HEMATOCRIT: 32 % — AB (ref 39.0–52.0)
Hemoglobin: 10.9 g/dL — ABNORMAL LOW (ref 13.0–17.0)
Potassium: 6.1 mmol/L (ref 3.5–5.1)
Sodium: 140 mmol/L (ref 135–145)
TCO2: 24 mmol/L (ref 0–100)

## 2015-10-05 NOTE — ED Notes (Signed)
Pt here for weakness and states he needs dialysis. Last dialyzed on Saturday because "they turned me down when i came here on Tuesday." Pt also endorses back pain.

## 2015-10-05 NOTE — ED Notes (Signed)
Pt refuses blood work at triage and wants it done when he gets into a room. Pt ambulatory and states, 'I'm going to the cafeteria."

## 2015-10-06 ENCOUNTER — Encounter (HOSPITAL_COMMUNITY): Payer: Self-pay | Admitting: Nurse Practitioner

## 2015-10-06 ENCOUNTER — Observation Stay (HOSPITAL_COMMUNITY)
Admission: EM | Admit: 2015-10-06 | Discharge: 2015-10-06 | Disposition: A | Discharge status: Home / Self Care | Payer: Medicare Other | Attending: Internal Medicine | Admitting: Internal Medicine

## 2015-10-06 ENCOUNTER — Emergency Department (HOSPITAL_COMMUNITY)
Admission: EM | Admit: 2015-10-06 | Discharge: 2015-10-06 | Disposition: A | Discharge status: Home / Self Care | Payer: Medicare Other | Attending: Emergency Medicine | Admitting: Emergency Medicine

## 2015-10-06 ENCOUNTER — Emergency Department (HOSPITAL_COMMUNITY): Payer: Medicare Other

## 2015-10-06 DIAGNOSIS — M62838 Other muscle spasm: Secondary | ICD-10-CM | POA: Insufficient documentation

## 2015-10-06 DIAGNOSIS — I160 Hypertensive urgency: Secondary | ICD-10-CM | POA: Diagnosis not present

## 2015-10-06 DIAGNOSIS — Z862 Personal history of diseases of the blood and blood-forming organs and certain disorders involving the immune mechanism: Secondary | ICD-10-CM | POA: Diagnosis not present

## 2015-10-06 DIAGNOSIS — E119 Type 2 diabetes mellitus without complications: Secondary | ICD-10-CM | POA: Insufficient documentation

## 2015-10-06 DIAGNOSIS — Z79899 Other long term (current) drug therapy: Secondary | ICD-10-CM | POA: Insufficient documentation

## 2015-10-06 DIAGNOSIS — N2581 Secondary hyperparathyroidism of renal origin: Secondary | ICD-10-CM | POA: Diagnosis not present

## 2015-10-06 DIAGNOSIS — F419 Anxiety disorder, unspecified: Secondary | ICD-10-CM | POA: Insufficient documentation

## 2015-10-06 DIAGNOSIS — M5440 Lumbago with sciatica, unspecified side: Secondary | ICD-10-CM | POA: Diagnosis not present

## 2015-10-06 DIAGNOSIS — Z7952 Long term (current) use of systemic steroids: Secondary | ICD-10-CM | POA: Insufficient documentation

## 2015-10-06 DIAGNOSIS — I509 Heart failure, unspecified: Secondary | ICD-10-CM | POA: Diagnosis not present

## 2015-10-06 DIAGNOSIS — Z992 Dependence on renal dialysis: Secondary | ICD-10-CM

## 2015-10-06 DIAGNOSIS — I120 Hypertensive chronic kidney disease with stage 5 chronic kidney disease or end stage renal disease: Secondary | ICD-10-CM | POA: Insufficient documentation

## 2015-10-06 DIAGNOSIS — Z87891 Personal history of nicotine dependence: Secondary | ICD-10-CM | POA: Diagnosis not present

## 2015-10-06 DIAGNOSIS — Z7901 Long term (current) use of anticoagulants: Secondary | ICD-10-CM | POA: Diagnosis not present

## 2015-10-06 DIAGNOSIS — R200 Anesthesia of skin: Secondary | ICD-10-CM | POA: Diagnosis not present

## 2015-10-06 DIAGNOSIS — N186 End stage renal disease: Secondary | ICD-10-CM | POA: Insufficient documentation

## 2015-10-06 DIAGNOSIS — R079 Chest pain, unspecified: Secondary | ICD-10-CM | POA: Diagnosis not present

## 2015-10-06 DIAGNOSIS — F329 Major depressive disorder, single episode, unspecified: Secondary | ICD-10-CM | POA: Insufficient documentation

## 2015-10-06 DIAGNOSIS — IMO0002 Reserved for concepts with insufficient information to code with codable children: Secondary | ICD-10-CM | POA: Diagnosis present

## 2015-10-06 DIAGNOSIS — M62830 Muscle spasm of back: Secondary | ICD-10-CM | POA: Diagnosis not present

## 2015-10-06 DIAGNOSIS — E1165 Type 2 diabetes mellitus with hyperglycemia: Secondary | ICD-10-CM

## 2015-10-06 DIAGNOSIS — E1129 Type 2 diabetes mellitus with other diabetic kidney complication: Secondary | ICD-10-CM | POA: Diagnosis present

## 2015-10-06 DIAGNOSIS — E875 Hyperkalemia: Secondary | ICD-10-CM | POA: Diagnosis not present

## 2015-10-06 DIAGNOSIS — R0602 Shortness of breath: Secondary | ICD-10-CM | POA: Diagnosis not present

## 2015-10-06 DIAGNOSIS — M5442 Lumbago with sciatica, left side: Secondary | ICD-10-CM

## 2015-10-06 DIAGNOSIS — E162 Hypoglycemia, unspecified: Secondary | ICD-10-CM

## 2015-10-06 DIAGNOSIS — E8770 Fluid overload, unspecified: Secondary | ICD-10-CM

## 2015-10-06 DIAGNOSIS — M5441 Lumbago with sciatica, right side: Secondary | ICD-10-CM

## 2015-10-06 DIAGNOSIS — D631 Anemia in chronic kidney disease: Secondary | ICD-10-CM | POA: Diagnosis not present

## 2015-10-06 LAB — COMPREHENSIVE METABOLIC PANEL
ALBUMIN: 3.1 g/dL — AB (ref 3.5–5.0)
ALT: 25 U/L (ref 17–63)
AST: 70 U/L — AB (ref 15–41)
Alkaline Phosphatase: 112 U/L (ref 38–126)
Anion gap: 19 — ABNORMAL HIGH (ref 5–15)
BUN: 77 mg/dL — AB (ref 6–20)
CHLORIDE: 100 mmol/L — AB (ref 101–111)
CO2: 22 mmol/L (ref 22–32)
Calcium: 8.5 mg/dL — ABNORMAL LOW (ref 8.9–10.3)
Creatinine, Ser: 15.43 mg/dL — ABNORMAL HIGH (ref 0.61–1.24)
GFR calc Af Amer: 4 mL/min — ABNORMAL LOW (ref >60)
GFR calc non Af Amer: 3 mL/min — ABNORMAL LOW (ref >60)
GLUCOSE: 79 mg/dL (ref 65–99)
POTASSIUM: 6.4 mmol/L — AB (ref 3.5–5.1)
SODIUM: 141 mmol/L (ref 135–145)
Total Bilirubin: 0.7 mg/dL (ref 0.3–1.2)
Total Protein: 8.2 g/dL — ABNORMAL HIGH (ref 6.5–8.1)

## 2015-10-06 LAB — CBC WITH DIFFERENTIAL/PLATELET
BASOS ABS: 0.1 10*3/uL (ref 0.0–0.1)
Basophils Relative: 1 %
Eosinophils Absolute: 0.4 10*3/uL (ref 0.0–0.7)
Eosinophils Relative: 9 %
HCT: 28.5 % — ABNORMAL LOW (ref 39.0–52.0)
Hemoglobin: 9 g/dL — ABNORMAL LOW (ref 13.0–17.0)
LYMPHS ABS: 1 10*3/uL (ref 0.7–4.0)
Lymphocytes Relative: 21 %
MCH: 28 pg (ref 26.0–34.0)
MCHC: 31.6 g/dL (ref 30.0–36.0)
MCV: 88.5 fL (ref 78.0–100.0)
MONO ABS: 0.3 10*3/uL (ref 0.1–1.0)
Monocytes Relative: 7 %
Neutro Abs: 3.1 10*3/uL (ref 1.7–7.7)
Neutrophils Relative %: 62 %
PLATELETS: 148 10*3/uL — AB (ref 150–400)
RBC: 3.22 MIL/uL — AB (ref 4.22–5.81)
RDW: 16.8 % — AB (ref 11.5–15.5)
WBC: 4.9 10*3/uL (ref 4.0–10.5)

## 2015-10-06 LAB — CBG MONITORING, ED
GLUCOSE-CAPILLARY: 128 mg/dL — AB (ref 65–99)
GLUCOSE-CAPILLARY: 54 mg/dL — AB (ref 65–99)
Glucose-Capillary: 12 mg/dL — CL (ref 65–99)
Glucose-Capillary: 176 mg/dL — ABNORMAL HIGH (ref 65–99)
Glucose-Capillary: 78 mg/dL (ref 65–99)

## 2015-10-06 LAB — BASIC METABOLIC PANEL
Anion gap: 20 — ABNORMAL HIGH (ref 5–15)
BUN: 79 mg/dL — AB (ref 6–20)
CO2: 21 mmol/L — ABNORMAL LOW (ref 22–32)
CREATININE: 15.55 mg/dL — AB (ref 0.61–1.24)
Calcium: 8.7 mg/dL — ABNORMAL LOW (ref 8.9–10.3)
Chloride: 100 mmol/L — ABNORMAL LOW (ref 101–111)
GFR, EST AFRICAN AMERICAN: 4 mL/min — AB (ref >60)
GFR, EST NON AFRICAN AMERICAN: 3 mL/min — AB (ref >60)
Glucose, Bld: 23 mg/dL — CL (ref 65–99)
POTASSIUM: 5.1 mmol/L (ref 3.5–5.1)
SODIUM: 141 mmol/L (ref 135–145)

## 2015-10-06 LAB — PROTIME-INR
INR: 1.37 (ref 0.00–1.49)
Prothrombin Time: 16.9 seconds — ABNORMAL HIGH (ref 11.6–15.2)

## 2015-10-06 LAB — TROPONIN I: Troponin I: 0.07 ng/mL — ABNORMAL HIGH (ref <0.031)

## 2015-10-06 MED ORDER — ISOSORBIDE MONONITRATE ER 30 MG PO TB24
90.0000 mg | ORAL_TABLET | Freq: Every day | ORAL | Status: DC
  Filled 2015-10-06: qty 1

## 2015-10-06 MED ORDER — PENTAFLUOROPROP-TETRAFLUOROETH EX AERO: 1.0000 "application " | INHALATION_SPRAY | CUTANEOUS | Status: DC | PRN

## 2015-10-06 MED ORDER — ONDANSETRON HCL 4 MG PO TABS
4.0000 mg | ORAL_TABLET | Freq: Four times a day (QID) | ORAL | Status: DC | PRN
Start: 2015-10-06 — End: 2015-10-06

## 2015-10-06 MED ORDER — AMLODIPINE BESYLATE 5 MG PO TABS: 10.0000 mg | ORAL_TABLET | Freq: Every day | ORAL | Status: DC

## 2015-10-06 MED ORDER — SODIUM CHLORIDE 0.9 % IV SOLN: 100.0000 mL | INTRAVENOUS | Status: DC | PRN

## 2015-10-06 MED ORDER — DARBEPOETIN ALFA 200 MCG/0.4ML IJ SOSY
200.0000 ug | PREFILLED_SYRINGE | INTRAMUSCULAR | Status: DC
  Administered 2015-10-06: 200 ug via INTRAVENOUS
  Filled 2015-10-06: qty 0.4

## 2015-10-06 MED ORDER — HYDRALAZINE HCL 20 MG/ML IJ SOLN
10.0000 mg | INTRAMUSCULAR | Status: DC | PRN
  Administered 2015-10-06: 10 mg via INTRAVENOUS
  Filled 2015-10-06: qty 1

## 2015-10-06 MED ORDER — CARVEDILOL 12.5 MG PO TABS: 6.2500 mg | ORAL_TABLET | Freq: Two times a day (BID) | ORAL | Status: DC

## 2015-10-06 MED ORDER — ONDANSETRON HCL 4 MG/2ML IJ SOLN
INTRAMUSCULAR | Status: AC
  Filled 2015-10-06: qty 2

## 2015-10-06 MED ORDER — ATORVASTATIN CALCIUM 10 MG PO TABS: 40.0000 mg | ORAL_TABLET | Freq: Every day | ORAL | Status: DC

## 2015-10-06 MED ORDER — SODIUM POLYSTYRENE SULFONATE 15 GM/60ML PO SUSP
60.0000 g | Freq: Once | ORAL | Status: AC
  Administered 2015-10-06: 60 g via ORAL

## 2015-10-06 MED ORDER — HYDRALAZINE HCL 25 MG PO TABS
100.0000 mg | ORAL_TABLET | Freq: Three times a day (TID) | ORAL | Status: DC
  Administered 2015-10-06: 100 mg via ORAL
  Filled 2015-10-06: qty 4

## 2015-10-06 MED ORDER — LIDOCAINE HCL (PF) 1 % IJ SOLN: 5.0000 mL | INTRAMUSCULAR | Status: DC | PRN

## 2015-10-06 MED ORDER — HYDRALAZINE HCL 20 MG/ML IJ SOLN
INTRAMUSCULAR | Status: AC
  Administered 2015-10-06: 20 mg via INTRAVENOUS_CENTRAL
  Filled 2015-10-06: qty 1

## 2015-10-06 MED ORDER — INSULIN ASPART 100 UNIT/ML ~~LOC~~ SOLN: 0.0000 [IU] | SUBCUTANEOUS | Status: DC

## 2015-10-06 MED ORDER — DEXTROSE 50 % IV SOLN
2.0000 | Freq: Once | INTRAVENOUS | Status: AC
  Administered 2015-10-06: 100 mL via INTRAVENOUS
  Filled 2015-10-06: qty 100

## 2015-10-06 MED ORDER — ONDANSETRON HCL 4 MG/2ML IJ SOLN
4.0000 mg | Freq: Once | INTRAMUSCULAR | Status: AC
  Administered 2015-10-06: 4 mg via INTRAVENOUS
  Filled 2015-10-06: qty 2

## 2015-10-06 MED ORDER — IBUPROFEN 400 MG PO TABS
600.0000 mg | ORAL_TABLET | Freq: Once | ORAL | Status: AC
  Administered 2015-10-06: 600 mg via ORAL
  Filled 2015-10-06: qty 1

## 2015-10-06 MED ORDER — HEPARIN SODIUM (PORCINE) 5000 UNIT/ML IJ SOLN
5000.0000 [IU] | Freq: Three times a day (TID) | INTRAMUSCULAR | Status: DC
  Administered 2015-10-06: 5000 [IU] via SUBCUTANEOUS
  Filled 2015-10-06: qty 1

## 2015-10-06 MED ORDER — ONDANSETRON HCL 4 MG/2ML IJ SOLN
4.0000 mg | Freq: Four times a day (QID) | INTRAMUSCULAR | Status: DC | PRN
  Administered 2015-10-06: 4 mg via INTRAVENOUS

## 2015-10-06 MED ORDER — INSULIN ASPART 100 UNIT/ML ~~LOC~~ SOLN
5.0000 [IU] | Freq: Once | SUBCUTANEOUS | Status: AC
  Administered 2015-10-06: 5 [IU] via INTRAVENOUS
  Filled 2015-10-06: qty 1

## 2015-10-06 MED ORDER — METHOCARBAMOL 500 MG PO TABS: 500.0000 mg | ORAL_TABLET | Freq: Three times a day (TID) | ORAL | Status: DC | PRN

## 2015-10-06 MED ORDER — DOCUSATE SODIUM 100 MG PO CAPS: 100.0000 mg | ORAL_CAPSULE | Freq: Two times a day (BID) | ORAL | Status: DC

## 2015-10-06 MED ORDER — DEXTROSE 50 % IV SOLN
INTRAVENOUS | Status: AC
  Administered 2015-10-06: 50 mL
  Filled 2015-10-06: qty 50

## 2015-10-06 MED ORDER — HEPARIN SODIUM (PORCINE) 1000 UNIT/ML DIALYSIS: 1000.0000 [IU] | INTRAMUSCULAR | Status: DC | PRN

## 2015-10-06 MED ORDER — CINACALCET HCL 30 MG PO TABS
30.0000 mg | ORAL_TABLET | Freq: Every day | ORAL | Status: DC
  Filled 2015-10-06: qty 1

## 2015-10-06 MED ORDER — HYDROXYZINE HCL 10 MG PO TABS
10.0000 mg | ORAL_TABLET | Freq: Three times a day (TID) | ORAL | Status: DC | PRN
  Filled 2015-10-06: qty 1

## 2015-10-06 MED ORDER — DARBEPOETIN ALFA 200 MCG/0.4ML IJ SOSY
PREFILLED_SYRINGE | INTRAMUSCULAR | Status: AC
  Administered 2015-10-06: 200 ug via INTRAVENOUS
  Filled 2015-10-06: qty 0.4

## 2015-10-06 MED ORDER — DOXERCALCIFEROL 4 MCG/2ML IV SOLN: 2.5000 ug | INTRAVENOUS | Status: DC

## 2015-10-06 MED ORDER — SEVELAMER CARBONATE 800 MG PO TABS
1600.0000 mg | ORAL_TABLET | Freq: Three times a day (TID) | ORAL | Status: DC
  Filled 2015-10-06 (×3): qty 2

## 2015-10-06 MED ORDER — SEVELAMER CARBONATE 800 MG PO TABS: 800.0000 mg | ORAL_TABLET | Freq: Three times a day (TID) | ORAL | Status: DC

## 2015-10-06 MED ORDER — HYDROCODONE-ACETAMINOPHEN 5-325 MG PO TABS: 1.0000 | ORAL_TABLET | Freq: Four times a day (QID) | ORAL | Status: DC | PRN

## 2015-10-06 MED ORDER — ALPRAZOLAM 0.25 MG PO TABS
0.5000 mg | ORAL_TABLET | Freq: Two times a day (BID) | ORAL | Status: DC | PRN
Start: 2015-10-06 — End: 2015-10-06

## 2015-10-06 MED ORDER — METHOCARBAMOL 500 MG PO TABS: 500.0000 mg | ORAL_TABLET | Freq: Four times a day (QID) | ORAL | Status: DC | PRN

## 2015-10-06 MED ORDER — SODIUM CHLORIDE 0.9% FLUSH: 3.0000 mL | Freq: Two times a day (BID) | INTRAVENOUS | Status: DC

## 2015-10-06 MED ORDER — ALTEPLASE 2 MG IJ SOLR: 2.0000 mg | Freq: Once | INTRAMUSCULAR | Status: DC | PRN

## 2015-10-06 MED ORDER — LABETALOL HCL 5 MG/ML IV SOLN
10.0000 mg | Freq: Once | INTRAVENOUS | Status: AC
  Administered 2015-10-06: 10 mg via INTRAVENOUS
  Filled 2015-10-06: qty 4

## 2015-10-06 MED ORDER — LISINOPRIL 20 MG PO TABS: 20.0000 mg | ORAL_TABLET | Freq: Every day | ORAL | Status: DC

## 2015-10-06 MED ORDER — PREDNISONE 5 MG PO TABS
5.0000 mg | ORAL_TABLET | Freq: Every day | ORAL | Status: DC
  Filled 2015-10-06: qty 1

## 2015-10-06 MED ORDER — GABAPENTIN 100 MG PO CAPS: 100.0000 mg | ORAL_CAPSULE | Freq: Three times a day (TID) | ORAL | Status: DC

## 2015-10-06 MED ORDER — RENA-VITE PO TABS: 1.0000 | ORAL_TABLET | Freq: Every day | ORAL | Status: DC

## 2015-10-06 MED ORDER — DEXTROSE 50 % IV SOLN: 50.0000 mL | Freq: Once | INTRAVENOUS | Status: DC

## 2015-10-06 MED ORDER — DEXTROSE 50 % IV SOLN
1.0000 | Freq: Once | INTRAVENOUS | Status: AC
  Administered 2015-10-06: 50 mL via INTRAVENOUS
  Filled 2015-10-06: qty 50

## 2015-10-06 MED ORDER — PANTOPRAZOLE SODIUM 40 MG PO TBEC: 40.0000 mg | DELAYED_RELEASE_TABLET | Freq: Every day | ORAL | Status: DC

## 2015-10-06 MED ORDER — IBUPROFEN 600 MG PO TABS: 600.0000 mg | ORAL_TABLET | Freq: Three times a day (TID) | ORAL | Status: DC | PRN

## 2015-10-06 MED ORDER — ALBUTEROL SULFATE (2.5 MG/3ML) 0.083% IN NEBU: 2.5000 mg | INHALATION_SOLUTION | Freq: Four times a day (QID) | RESPIRATORY_TRACT | Status: DC | PRN

## 2015-10-06 MED ORDER — OXYCODONE HCL 5 MG PO TABS
5.0000 mg | ORAL_TABLET | Freq: Once | ORAL | Status: AC
  Administered 2015-10-06: 5 mg via ORAL
  Filled 2015-10-06: qty 1

## 2015-10-06 MED ORDER — METHOCARBAMOL 500 MG PO TABS
500.0000 mg | ORAL_TABLET | Freq: Once | ORAL | Status: AC
  Administered 2015-10-06: 500 mg via ORAL
  Filled 2015-10-06: qty 1

## 2015-10-06 MED ORDER — LIDOCAINE-PRILOCAINE 2.5-2.5 % EX CREA: 1.0000 "application " | TOPICAL_CREAM | CUTANEOUS | Status: DC | PRN

## 2015-10-06 MED ORDER — SODIUM POLYSTYRENE SULFONATE 15 GM/60ML PO SUSP
30.0000 g | Freq: Once | ORAL | Status: DC
  Filled 2015-10-06: qty 120

## 2015-10-06 MED ORDER — ALBUTEROL SULFATE (2.5 MG/3ML) 0.083% IN NEBU: 2.5000 mg | INHALATION_SOLUTION | RESPIRATORY_TRACT | Status: DC | PRN

## 2015-10-06 MED ORDER — HEPARIN SODIUM (PORCINE) 1000 UNIT/ML DIALYSIS: 20.0000 [IU]/kg | INTRAMUSCULAR | Status: DC | PRN

## 2015-10-06 MED ORDER — SEVELAMER CARBONATE 800 MG PO TABS
800.0000 mg | ORAL_TABLET | ORAL | Status: DC | PRN
  Filled 2015-10-06: qty 1

## 2015-10-06 NOTE — Progress Notes (Signed)
Patient completed HD treatment as ordered. Patient has been seen by both Dr Joelyn Oms and Dr Earlie Counts. Patient has been given discharge orders. Patient has discussed transportation to his own outpatient unit with [place] worker with no resolve on the part of the patient. Patient states he made arrangements to dialyze on Monday at [facility]. Patient desired to stay in the hospital and receive HD tomorrow and that has been discussed with both Nephology and [facility]. Patient now states that he cannot feel his legs but he is able to stand. Patient will be discharged and has been told to present back to the ED with any new complaints.

## 2015-10-06 NOTE — Progress Notes (Signed)
ANTICOAGULATION CONSULT NOTE - Initial Consult  Pharmacy Consult for Warfarin  Indication: History of PE  Allergies  Allergen Reactions  . Butalbital-Apap-Caffeine Shortness Of Breath and Swelling    Swelling in throat  . Ferrlecit [Na Ferric Gluc Cplx In Sucrose] Shortness Of Breath, Swelling and Other (See Comments)    Swelling in throat  . Minoxidil Shortness Of Breath  . Darvocet [Propoxyphene N-Acetaminophen] Hives  . Propoxyphene Hives    Allergy listed on file from [facility] (Pt did not mention any allergies)    Patient Measurements: Height: _0  (188 cm) Weight: 171 lb 6 oz (77.735 kg) IBW/kg (Calculated) : 82.2  Vital Signs: Temp: 98.6 F (37 C) (03/16 1843) Temp Source: Oral (03/16 1843) BP: 163/110 mmHg (03/17 0630) Pulse Rate: 63 (03/17 0630)  Labs:  Recent Labs  10/05/15 2112 10/06/15 0025 10/06/15 0414 10/06/15 0446  HGB 10.9* 9.0*  --   --   HCT 32.0* 28.5*  --   --   PLT  --  148*  --   --   CREATININE 15.80* 15.43*  --  15.55*  TROPONINI  --   --  0.07*  --     Estimated Creatinine Clearance: 6.2 mL/min (by C-G formula based on Cr of 15.55).   Medical History: Past Medical History  Diagnosis Date  . Hypertension   . Depression   . Complication of anesthesia     itching, sore throat  . Diabetes mellitus without complication ([place])     No history per patient, but remains under history as A1c would not be accurate given on dialysis  . Shortness of breath   . Anxiety   . ESRD (end stage renal disease) ([place])     due to HTN per patient, followed at [facility], s/p failed kidney transplant - dialysis Tue, Th, Sat  . Renal insufficiency   . CHF (congestive heart failure) ([place])   . Anemia   . Dialysis patient [facility])     Assessment: Warfarin for hx PE, here with weakness/needing HD, no INR yet  Goal of Therapy:  INR 2-3 Monitor platelets by anticoagulation protocol: Yes   Plan:  -Ordering INR to assess dosing needs  Narda Bonds 10/06/2015,6:40 AM

## 2015-10-06 NOTE — ED Notes (Signed)
CBG is 54

## 2015-10-06 NOTE — ED Notes (Signed)
Pt very confuse and combative, CBG 12 at this time. One amp of D50 given Dr. Tamala Julian notified.

## 2015-10-06 NOTE — ED Provider Notes (Signed)
Late entry: Alerted by Dr. Wyline Copas that patient was checking back into the ED with numbness in his legs and "not being able to feel legs" after being discharged after dialysis today.  Frank Essex, MD 10/06/15 1816

## 2015-10-06 NOTE — Progress Notes (Signed)
Patient with a blood sugar of 78 but has refused any snacks or juice.

## 2015-10-06 NOTE — ED Provider Notes (Signed)
CSN: [id number]     Arrival date & time 10/05/15  1825 History  By signing my name below, I, Evelene Croon, attest that this documentation has been prepared under the direction and in the presence of Gareth Morgan, MD . Electronically Signed: Evelene Croon, Scribe. 10/06/2015. 2:10 AM.  Chief Complaint  Patient presents with  . Weakness  . Needs dialysis     The history is provided by the patient and medical records. No language interpreter was used.    HPI Comments:  Frank [name] is a 50 y.o. male with a history of HTN, ESRD, and DM, who presents to the Emergency Department for dialysis. Pt receives dialysis on Tu/Th/Sat. He has been unable to get to his normal dialysis center due to lack of transportation. He came to the ED on 10/03/15 for dialysis but states the center was full. Pt was last dialyzed on 09/30/14. At this time pt is complaining of achy 7/10 mid back pain, states his pain is worse with movement. He notes mild CP that has resolved at this time. He also notes generalized weakness,  SOB x 1 day, nausea, and vomiting with 2-3 episodes; all of which he states is typical when he has not received dialysis. He denies fever, and cough. No alleviating factors noted.  Past Medical History  Diagnosis Date  . Hypertension   . Depression   . Complication of anesthesia     itching, sore throat  . Diabetes mellitus without complication ([place])     No history per patient, but remains under history as A1c would not be accurate given on dialysis  . Shortness of breath   . Anxiety   . ESRD (end stage renal disease) ([place])     due to HTN per patient, followed at [facility], s/p failed kidney transplant - dialysis Tue, Th, Sat  . Renal insufficiency   . CHF (congestive heart failure) ([place])   . Anemia   . Dialysis patient [facility])    Past Surgical History  Procedure Laterality Date  . Kidney receipient  2006    failed and started HD in March 2014  . Capd insertion    . Capd removal    . Left  heart catheterization with coronary angiogram N/A 09/02/2014    Procedure: LEFT HEART CATHETERIZATION WITH CORONARY ANGIOGRAM;  Surgeon: [name], MD;  Location: [facility] CATH LAB;  Service: Cardiovascular;  Laterality: N/A;  . Inguinal hernia repair Right 02/14/2015    Procedure: REPAIR INCARCERATED RIGHT INGUINAL HERNIA;  Surgeon: [name], MD;  Location: [place];  Service: General;  Laterality: Right;  . Insertion of dialysis catheter Right 09/23/2015    Procedure: exchange of Right internal Dialysis Catheter.;  Surgeon: [name], MD;  Location: [facility] OR;  Service: Vascular;  Laterality: Right;   Family History  Problem Relation Age of Onset  . Hypertension Other    Social History  Substance Use Topics  . Smoking status: Former Smoker -- 0.00 packs/day for 1 years    Types: Cigarettes  . Smokeless tobacco: Never Used     Comment: quit Jan 2014  . Alcohol Use: No    Review of Systems  Constitutional: Negative for fever.  HENT: Negative for sore throat.   Eyes: Negative for visual disturbance.  Respiratory: Positive for shortness of breath. Negative for cough.   Cardiovascular: Positive for chest pain (PTA, resolved).  Gastrointestinal: Positive for nausea and vomiting. Negative for abdominal pain and diarrhea.  Genitourinary: Negative for difficulty urinating.  Musculoskeletal: Positive for back pain. Negative for neck stiffness.  Skin: Negative for rash.  Neurological: Positive for weakness (generalized ). Negative for syncope, numbness and headaches.  All other systems reviewed and are negative.   Allergies  Butalbital-apap-caffeine; Ferrlecit; Minoxidil; Darvocet; and Propoxyphene  Home Medications   Prior to Admission medications   Medication Sig Start Date End Date Taking? Authorizing Provider  ALPRAZolam [name]) 0.5 MG tablet Take 1 tablet (0.5 mg total) by mouth 2 (two) times daily as needed for anxiety. 09/11/15  Yes [name], MD  amLODipine (NORVASC) 10  MG tablet Take 10 mg by mouth daily.   Yes Historical Provider, MD  atorvastatin (LIPITOR) 40 MG tablet Take 1 tablet (40 mg total) by mouth daily at 6 PM. 09/02/14  Yes Barton Dubois, MD  carvedilol (COREG) 6.25 MG tablet Take 1 tablet (6.25 mg total) by mouth 2 (two) times daily with a meal. 09/27/15  Yes [name], MD  cinacalcet (SENSIPAR) 30 MG tablet Take 30 mg by mouth daily.   Yes Historical Provider, MD  docusate sodium (COLACE) 100 MG capsule Take 1 capsule (100 mg total) by mouth 2 (two) times daily. 11/19/14  Yes [name], MD  doxercalciferol (HECTOROL) 4 MCG/2ML injection Inject 1.25 mLs (2.5 mcg total) into the vein Every Tuesday,Thursday,and Saturday with dialysis. 10/10/14  Yes [name], DO  gabapentin (NEURONTIN) 100 MG capsule Take 100 mg by mouth 3 (three) times daily. 06/14/15 10/06/15 Yes Historical Provider, MD  hydrALAZINE (APRESOLINE) 50 MG tablet Take 2 tablets (100 mg total) by mouth every 8 (eight) hours. 09/11/15  Yes [name], MD  hydrocerin (EUCERIN) CREA Apply 1 application topically 2 (two) times daily. 02/04/15  Yes [name], MD  HYDROcodone-acetaminophen (NORCO/VICODIN) 5-325 MG tablet Take 1 tablet by mouth every 6 (six) hours as needed. Patient taking differently: Take 1 tablet by mouth every 6 (six) hours as needed for moderate pain.  09/27/15  Yes [name], MD  hydrOXYzine (ATARAX/VISTARIL) 10 MG tablet Take 1 tablet (10 mg total) by mouth 3 (three) times daily as needed for itching. 09/27/15  Yes [name], MD  isosorbide mononitrate (IMDUR) 30 MG 24 hr tablet Take 3 tablets (90 mg total) by mouth daily. 09/11/15  Yes [name], MD  levalbuterol [facility] HFA) 45 MCG/ACT inhaler Inhale 2 puffs into the lungs every 6 (six) hours as needed for wheezing or shortness of breath. 11/19/14  Yes [name], MD  lisinopril (PRINIVIL,ZESTRIL) 20 MG tablet Take 1 tablet (20 mg total) by mouth daily. 02/04/15  Yes [name], MD  multivitamin (RENA-VIT) TABS tablet Take 1 tablet by mouth at bedtime. 02/04/15  Yes [name], MD  omeprazole (PRILOSEC) 20 MG capsule Take 20 mg by mouth daily. 07/01/13  Yes Historical Provider, MD  predniSONE (DELTASONE) 5 MG tablet Take 5 mg by mouth daily with breakfast.   Yes Historical Provider, MD  sevelamer carbonate (RENVELA) 800 MG tablet Take 800-1,600 mg by mouth 3 (three) times daily with meals. 2 tabs three times daily with meals, and 1 tablet with snacks   Yes Historical Provider, MD  warfarin (COUMADIN) 6 MG tablet Take 1.5 tablets (9 mg total) by mouth daily at 6 PM. Take daily until adjusted per MD 09/12/15  Yes [name], MD   BP 194/120 mmHg  Pulse 88  Temp(Src) 98.2 F (36.8 C) (Oral)  Resp 15  Ht _0  (1.88 m)  Wt 163 lb 12.8 oz (74.3 kg)  BMI 21.02 kg/m2  SpO2 98% Physical Exam  Constitutional: He is oriented to person, place, and time. He appears well-developed and well-nourished. No distress.  HENT:  Head: Normocephalic and atraumatic.  Eyes: Conjunctivae and EOM are normal.  Neck: Normal range of motion.  Cardiovascular: Normal rate, regular rhythm, normal heart sounds and intact distal pulses.  Exam reveals no gallop and no friction rub.   No murmur heard. Pulmonary/Chest: Effort normal. No respiratory distress. He has no wheezes. He has rales (mild bilateral).  Abdominal: Soft. He exhibits no distension. There is no tenderness. There is no guarding.  Musculoskeletal: He exhibits no edema.       Thoracic back: He exhibits tenderness and bony tenderness.  Neurological: He is alert and oriented to person, place, and time.  Skin: Skin is warm and dry. He is not diaphoretic.  Nursing note and vitals reviewed.   ED Course  Procedures   DIAGNOSTIC STUDIES:  Oxygen Saturation is 100% on RA, normal by my interpretation.    COORDINATION OF CARE:  1:10 AM Discussed treatment plan with pt at bedside and pt agreed to plan.  Labs  Review Labs Reviewed  CBC WITH DIFFERENTIAL/PLATELET - Abnormal; Notable for the following:    RBC 3.22 (*)    Hemoglobin 9.0 (*)    HCT 28.5 (*)    RDW 16.8 (*)    Platelets 148 (*)    All other components within normal limits  COMPREHENSIVE METABOLIC PANEL - Abnormal; Notable for the following:    Potassium 6.4 (*)    Chloride 100 (*)    BUN 77 (*)    Creatinine, Ser 15.43 (*)    Calcium 8.5 (*)    Total Protein 8.2 (*)    Albumin 3.1 (*)    AST 70 (*)    GFR calc non Af Amer 3 (*)    GFR calc Af Amer 4 (*)    Anion gap 19 (*)    All other components within normal limits  TROPONIN I - Abnormal; Notable for the following:    Troponin I 0.07 (*)    All other components within normal limits  BASIC METABOLIC PANEL - Abnormal; Notable for the following:    Chloride 100 (*)    CO2 21 (*)    Glucose, Bld 23 (*)    BUN 79 (*)    Creatinine, Ser 15.55 (*)    Calcium 8.7 (*)    GFR calc non Af Amer 3 (*)    GFR calc Af Amer 4 (*)    Anion gap 20 (*)    All other components within normal limits  PROTIME-INR - Abnormal; Notable for the following:    Prothrombin Time 16.9 (*)    All other components within normal limits  I-STAT CHEM 8, ED - Abnormal; Notable for the following:    Potassium 6.1 (*)    BUN 73 (*)    Creatinine, Ser 15.80 (*)    Calcium, Ion 0.97 (*)    Hemoglobin 10.9 (*)    HCT 32.0 (*)    All other components within normal limits  CBG MONITORING, ED - Abnormal; Notable for the following:    Glucose-Capillary 12 (*)    All other components within normal limits  CBG MONITORING, ED - Abnormal; Notable for the following:    Glucose-Capillary 128 (*)    All other components within normal limits  CBG MONITORING, ED - Abnormal; Notable for the  following:    Glucose-Capillary 54 (*)    All other components within normal limits  CBG MONITORING, ED - Abnormal; Notable for the following:    Glucose-Capillary 176 (*)    All other components within normal limits     Imaging Review Dg Chest 2 View  10/06/2015  CLINICAL DATA:  Acute onset of mid back pain and generalized weakness. Shortness of breath, nausea and vomiting. Initial encounter. EXAM: CHEST  2 VIEW COMPARISON:  Chest radiograph performed 09/23/2015 FINDINGS: The lungs are well-aerated. Vascular congestion is noted. Mildly increased interstitial markings could reflect minimal interstitial edema. There is no evidence of pleural effusion or pneumothorax. The heart is enlarged. A right-sided dual-lumen catheter is noted ending about the cavoatrial junction. No acute osseous abnormalities are seen. IMPRESSION: Vascular congestion and cardiomegaly. Mildly increased interstitial markings could reflect minimal interstitial edema. Electronically Signed   By: Garald Balding M.D.   On: 10/06/2015 03:04   I have personally reviewed and evaluated these images and lab results as part of my medical decision-making.   EKG Interpretation   Date/Time:  Friday October 06 2015 00:41:51 EDT Ventricular Rate:  86 PR Interval:  179 QRS Duration: 88 QT Interval:  428 QTC Calculation: 512 R Axis:   -40 Text Interpretation:  Sinus rhythm Probable left atrial enlargement Left  anterior fascicular block Abnormal R-wave progression, late transition  Left ventricular hypertrophy Prolonged QT interval ST changes in V6 no  longer persent Otherwise no significant change Confirmed by [facility] MD,  Jazaria Jarecki ([id number]) on 10/06/2015 12:56:47 AM      MDM  Will administer kayexalate to lower potassium, and admit pt to hospitalist to receive dialysis in the AM.  Final diagnoses:  None   50yo male with history of hypertension, ESRD, DM, CHF, presents to the ED with chief concern of needing dialysis. Reports symptoms today are consistent with prior symptoms of needing dialysis, including shortness of breath and nausea.  Patient also notes back pain, worse with palpations and movements, has equal pulses, and doubt dissection. Given  oxycodone for pain. Reports chest pain also on review of systems, no ECG changes, trop ordered by admitting physician.  Do not feel presentation is consistent with PE. XR shows mild interstitial edema. Patient reports he is here for dialysis.  Patient with normal O2 saturation on room air.   Labs show potassium 6.4. No ECG changes. Given insulin 5U and amp D50 as well as 60 of kayexelate.  Called nephrology who will do dialysis in AM. Pt to be admitted to hospitalist for further care.  Given labetalol 55m for hypertension.   I personally performed the services described in this documentation, which was scribed in my presence. The recorded information has been reviewed and is accurate.   EGareth Morgan MD 10/06/15 1455
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
## 2015-10-06 NOTE — ED Notes (Addendum)
CRITICAL VALUE ALERT  Critical value received:  Potassium 6.4

## 2015-10-06 NOTE — ED Provider Notes (Addendum)
CSN: [id number]     Arrival date & time 10/06/15  1223 History   First MD Initiated Contact with Patient 10/06/15 1812     Chief Complaint  Patient presents with  . Numbness      HPI Patient presents to the emergency department with complaints of severe low back pain with radiation down his bilateral buttocks and down into his legs.  He states it is painful for him to walk and that he feels more comfortable with his knees drawn forward.  He denies fevers.  He denies IV drug abuse.  He was admitted the hospital last night and discharged earlier today after need for acute dialysis.  He's been receiving his dialysis here at the hospital over the past 2 weeks because of some transportation issues.  He denies abdominal pain.  He denies chest pain shortness of breath.  He denies weakness of his arms.  He reports numbness and tingling of his bilateral lower extremity was.  No bowel incontinence.  No saddle anesthesia described.  Pain is moderate in severity.  He states he normally receives Percocet for his recurrent low back pain which is prescribed through the emergency departments at hospitals.  He does not have a primary care physician who prescribes opioids on a monthly basis.   Past Medical History  Diagnosis Date  . Hypertension   . Depression   . Complication of anesthesia     itching, sore throat  . Diabetes mellitus without complication ([place])     No history per patient, but remains under history as A1c would not be accurate given on dialysis  . Shortness of breath   . Anxiety   . ESRD (end stage renal disease) ([place])     due to HTN per patient, followed at [facility], s/p failed kidney transplant - dialysis Tue, Th, Sat  . Renal insufficiency   . CHF (congestive heart failure) ([place])   . Anemia   . Dialysis patient [facility])    Past Surgical History  Procedure Laterality Date  . Kidney receipient  2006    failed and started HD in March 2014  . Capd insertion    . Capd removal    . Left  heart catheterization with coronary angiogram N/A 09/02/2014    Procedure: LEFT HEART CATHETERIZATION WITH CORONARY ANGIOGRAM;  Surgeon: [name], MD;  Location: [facility] CATH LAB;  Service: Cardiovascular;  Laterality: N/A;  . Inguinal hernia repair Right 02/14/2015    Procedure: REPAIR INCARCERATED RIGHT INGUINAL HERNIA;  Surgeon: [name], MD;  Location: [facility];  Service: General;  Laterality: Right;  . Insertion of dialysis catheter Right 09/23/2015    Procedure: exchange of Right internal Dialysis Catheter.;  Surgeon: [name], MD;  Location: [facility] OR;  Service: Vascular;  Laterality: Right;   Family History  Problem Relation Age of Onset  . Hypertension Other    Social History  Substance Use Topics  . Smoking status: Former Smoker -- 0.00 packs/day for 1 years    Types: Cigarettes  . Smokeless tobacco: Never Used     Comment: quit Jan 2014  . Alcohol Use: No    Review of Systems  All other systems reviewed and are negative.     Allergies  Butalbital-apap-caffeine; Ferrlecit; Minoxidil; Darvocet; and Propoxyphene  Home Medications   Prior to Admission medications   Medication Sig Start Date End Date Taking? Authorizing Provider  ALPRAZolam [name]) 0.5 MG tablet Take 1 tablet (0.5 mg total) by mouth 2 (two) times  daily as needed for anxiety. 09/11/15   [name], MD  amLODipine (NORVASC) 10 MG tablet Take 10 mg by mouth daily.    Historical Provider, MD  atorvastatin (LIPITOR) 40 MG tablet Take 1 tablet (40 mg total) by mouth daily at 6 PM. 09/02/14   Barton Dubois, MD  carvedilol (COREG) 6.25 MG tablet Take 1 tablet (6.25 mg total) by mouth 2 (two) times daily with a meal. 09/27/15   [name], MD  cinacalcet (SENSIPAR) 30 MG tablet Take 30 mg by mouth daily.    Historical Provider, MD  docusate sodium (COLACE) 100 MG capsule Take 1 capsule (100 mg total) by mouth 2 (two) times daily. 11/19/14   [name], MD  doxercalciferol (HECTOROL) 4 MCG/2ML  injection Inject 1.25 mLs (2.5 mcg total) into the vein Every Tuesday,Thursday,and Saturday with dialysis. 10/10/14   [name], DO  gabapentin (NEURONTIN) 100 MG capsule Take 100 mg by mouth 3 (three) times daily. 06/14/15 10/06/15  Historical Provider, MD  hydrALAZINE (APRESOLINE) 50 MG tablet Take 2 tablets (100 mg total) by mouth every 8 (eight) hours. 09/11/15   [name], MD  hydrocerin (EUCERIN) CREA Apply 1 application topically 2 (two) times daily. 02/04/15   [name], MD  HYDROcodone-acetaminophen (NORCO/VICODIN) 5-325 MG tablet Take 1 tablet by mouth every 6 (six) hours as needed. Patient taking differently: Take 1 tablet by mouth every 6 (six) hours as needed for moderate pain.  09/27/15   [name], MD  hydrOXYzine (ATARAX/VISTARIL) 10 MG tablet Take 1 tablet (10 mg total) by mouth 3 (three) times daily as needed for itching. 09/27/15   [name], MD  ibuprofen (ADVIL,MOTRIN) 600 MG tablet Take 1 tablet (600 mg total) by mouth every 8 (eight) hours as needed. 10/06/15   [name], MD  isosorbide mononitrate (IMDUR) 30 MG 24 hr tablet Take 3 tablets (90 mg total) by mouth daily. 09/11/15   [name], MD  levalbuterol [facility] HFA) 45 MCG/ACT inhaler Inhale 2 puffs into the lungs every 6 (six) hours as needed for wheezing or shortness of breath. 11/19/14   [name], MD  lisinopril (PRINIVIL,ZESTRIL) 20 MG tablet Take 1 tablet (20 mg total) by mouth daily. 02/04/15   [name], MD  methocarbamol (ROBAXIN) 500 MG tablet Take 1 tablet (500 mg total) by mouth every 8 (eight) hours as needed for muscle spasms. 10/06/15   [name], MD  multivitamin (RENA-VIT) TABS tablet Take 1 tablet by mouth at bedtime. 02/04/15   [name], MD  omeprazole (PRILOSEC) 20 MG capsule Take 20 mg by mouth daily. 07/01/13   Historical Provider, MD  predniSONE (DELTASONE) 5 MG tablet Take 5 mg by mouth daily with breakfast.    Historical Provider, MD  sevelamer  carbonate (RENVELA) 800 MG tablet Take 800-1,600 mg by mouth 3 (three) times daily with meals. 2 tabs three times daily with meals, and 1 tablet with snacks    Historical Provider, MD  warfarin (COUMADIN) 6 MG tablet Take 1.5 tablets (9 mg total) by mouth daily at 6 PM. Take daily until adjusted per MD 09/12/15   [name], MD   BP 187/129 mmHg  Pulse 87  Temp(Src) 98 F (36.7 C) (Oral)  Resp 18  SpO2 100% Physical Exam  Constitutional: He is oriented to person, place, and time. He appears well-developed and well-nourished.  HENT:  Head: Normocephalic and atraumatic.  Eyes: EOM are normal.  Neck: Normal range of motion.  Cardiovascular: Normal rate, regular rhythm and intact distal pulses.   Pulmonary/Chest: Effort normal and breath sounds normal. No respiratory distress.  Dialysis access in his right upper chest without surrounding signs of infection.  Abdominal: Soft. He exhibits no distension. There is no tenderness.  Musculoskeletal:  Full range of motion bilateral hips, knees, ankles.  Normal patellar and Achilles reflexes bilaterally.  Normal PT and DP pulses bilaterally.  Legs are symmetric without swelling.  No rash of his lower extremities noted.  Patient is able to raise each leg off the bed.  He is able to bring his knees to his chest.  He reports standing is too painful at this time  Neurological: He is alert and oriented to person, place, and time.  Skin: Skin is warm and dry.  Psychiatric: He has a normal mood and affect. Judgment normal.  Nursing note and vitals reviewed.   ED Course  Procedures (including critical care time) Labs Review Labs Reviewed  CBG MONITORING, ED    Imaging Review Dg Chest 2 View  10/06/2015  CLINICAL DATA:  Acute onset of mid back pain and generalized weakness. Shortness of breath, nausea and vomiting. Initial encounter. EXAM: CHEST  2 VIEW COMPARISON:  Chest radiograph performed 09/23/2015 FINDINGS: The lungs are well-aerated.  Vascular congestion is noted. Mildly increased interstitial markings could reflect minimal interstitial edema. There is no evidence of pleural effusion or pneumothorax. The heart is enlarged. A right-sided dual-lumen catheter is noted ending about the cavoatrial junction. No acute osseous abnormalities are seen. IMPRESSION: Vascular congestion and cardiomegaly. Mildly increased interstitial markings could reflect minimal interstitial edema. Electronically Signed   By: Garald Balding M.D.   On: 10/06/2015 03:04   I have personally reviewed and evaluated these images and lab results as part of my medical decision-making.   EKG Interpretation None      MDM   Final diagnoses:  Bilateral low back pain with sciatica, sciatica laterality unspecified  Lumbar paraspinal muscle spasm    Patient denies recent injury or trauma.  He has normal strength and reflexes in his lower extremities.  I suspect that the majority of his symptoms are related to recurrent low back pain with radiation.  This appears to be more of a pain exacerbation.  His pain will be treated with heat, ibuprofen, muscle relaxants.  I recommended that if opioid medications are required for his recurrent back pain that this best be prescribed her primary care physician.  I do not think he has an epidural abscess or cauda equina.  I do not think he needs emergent imaging of his low back with MRI.  If his symptoms are persistent he may benefit in the future as he likely has some degree of chronic arthritis/herniated disc as he has had recurrent back pain for years.  INR 1.37 earlier today  [name], MD 10/06/15 [place], MD 10/06/15 [id number]

## 2015-10-06 NOTE — Procedures (Signed)
I was present at this dialysis session. I have reviewed the session itself and made appropriate changes.   Patient now states he went to HD 3/14 at his local unit but was sent to hosp for HTN.  HE states he has no transportation.  Advised to talk with HD unit SW to arrange transportation.    Given that he has an outpatient unit he will only rec HD here for hyperkalemia, Vol issues, or other emergent issues.  Otherwise he should present to his local unit.   OK for discharge after HD today, can go to outpt HD tomorrow.   Pearson Grippe  MD 10/06/2015, 9:14 AM

## 2015-10-06 NOTE — ED Notes (Addendum)
Pt was here for hemodialysis, received full treatment today. He told HD staff that he was having numbness in bilateral legs during HD but was able to stand and ambulate so he was discharged. He returned to ED For re-evaluation. He states he continues to not be able to feel his legs and hes having cold chills. He denies pain. Grips = bilaterally, no slurred speech, no arm drift or facial droop, A&Ox4. Pt continues to say he is going to fall out of his chair but he is able to maintain good body posture AND resting easily at this time

## 2015-10-06 NOTE — Consult Note (Signed)
Reason for Consult: To manage dialysis and dialysis related needs Referring Physician: [name] is an 50 y.o. male with PMH sig for DM, HTN, ESRD, CHF (EF 25%), anemia, and noncompliance with dialysis. Has Dialysis home in [facility] but doesn't show up there very often. (His most recent treatments here at [facility] were  2/27, 3/2 and 3/4, 3/7 and 3/11).  He has many excuses as to why he cannot get to his OP unit- came here on 3/14 but did not need emergent HD so was told to go to his OP unit- he did not- presents here today- K is 6.4 - hypertensive and volume overloaded      Past Medical History  Diagnosis Date  . Hypertension   . Depression   . Complication of anesthesia     itching, sore throat  . Diabetes mellitus without complication ([place])     No history per patient, but remains under history as A1c would not be accurate given on dialysis  . Shortness of breath   . Anxiety   . ESRD (end stage renal disease) ([place])     due to HTN per patient, followed at [facility], s/p failed kidney transplant - dialysis Tue, Th, Sat  . Renal insufficiency   . CHF (congestive heart failure) ([place])   . Anemia   . Dialysis patient [facility])     Past Surgical History  Procedure Laterality Date  . Kidney receipient  2006    failed and started HD in March 2014  . Capd insertion    . Capd removal    . Left heart catheterization with coronary angiogram N/A 09/02/2014    Procedure: LEFT HEART CATHETERIZATION WITH CORONARY ANGIOGRAM;  Surgeon: [name], MD;  Location: [facility] CATH LAB;  Service: Cardiovascular;  Laterality: N/A;  . Inguinal hernia repair Right 02/14/2015    Procedure: REPAIR INCARCERATED RIGHT INGUINAL HERNIA;  Surgeon: [name], MD;  Location: [place];  Service: General;  Laterality: Right;  . Insertion of dialysis catheter Right 09/23/2015    Procedure: exchange of Right internal Dialysis Catheter.;  Surgeon: [name], MD;  Location: [facility] OR;  Service: Vascular;  Laterality:  Right;    Family History  Problem Relation Age of Onset  . Hypertension Other     Social History:  reports that he has quit smoking. His smoking use included Cigarettes. He smoked 0.00 packs per day for 1 year. He has never used smokeless tobacco. He reports that he does not drink alcohol or use illicit drugs.  Allergies:  Allergies  Allergen Reactions  . Butalbital-Apap-Caffeine Shortness Of Breath and Swelling    Swelling in throat  . Ferrlecit [Na Ferric Gluc Cplx In Sucrose] Shortness Of Breath, Swelling and Other (See Comments)    Swelling in throat  . Minoxidil Shortness Of Breath  . Darvocet [Propoxyphene N-Acetaminophen] Hives  . Propoxyphene Hives    Allergy listed on file from [facility] (Pt did not mention any allergies)     Medications: I have reviewed the patient's current medications.   Results for orders placed or performed during the hospital encounter of 10/06/15 (from the past 48 hour(s))  I-Stat Chem 8, ED     Status: Abnormal   Collection Time: 10/05/15  9:12 PM  Result Value Ref Range   Sodium 140 135 - 145 mmol/L   Potassium 6.1 (HH) 3.5 - 5.1 mmol/L   Chloride 103 101 - 111 mmol/L   BUN 73 (H) 6 - 20  mg/dL   Creatinine, Ser 15.80 (H) 0.61 - 1.24 mg/dL   Glucose, Bld 92 65 - 99 mg/dL   Calcium, Ion 0.97 (L) 1.12 - 1.23 mmol/L   TCO2 24 0 - 100 mmol/L   Hemoglobin 10.9 (L) 13.0 - 17.0 g/dL   HCT 32.0 (L) 39.0 - 52.0 %   Comment NOTIFIED PHYSICIAN   CBC with Differential     Status: Abnormal   Collection Time: 10/06/15 12:25 AM  Result Value Ref Range   WBC 4.9 4.0 - 10.5 K/uL   RBC 3.22 (L) 4.22 - 5.81 MIL/uL   Hemoglobin 9.0 (L) 13.0 - 17.0 g/dL   HCT 28.5 (L) 39.0 - 52.0 %   MCV 88.5 78.0 - 100.0 fL   MCH 28.0 26.0 - 34.0 pg   MCHC 31.6 30.0 - 36.0 g/dL   RDW 16.8 (H) 11.5 - 15.5 %   Platelets 148 (L) 150 - 400 K/uL   Neutrophils Relative % 62 %   Lymphocytes Relative 21 %   Monocytes Relative 7 %   Eosinophils Relative 9 %   Basophils  Relative 1 %   Neutro Abs 3.1 1.7 - 7.7 K/uL   Lymphs Abs 1.0 0.7 - 4.0 K/uL   Monocytes Absolute 0.3 0.1 - 1.0 K/uL   Eosinophils Absolute 0.4 0.0 - 0.7 K/uL   Basophils Absolute 0.1 0.0 - 0.1 K/uL   RBC Morphology RARE NRBCs   Comprehensive metabolic panel     Status: Abnormal   Collection Time: 10/06/15 12:25 AM  Result Value Ref Range   Sodium 141 135 - 145 mmol/L   Potassium 6.4 (HH) 3.5 - 5.1 mmol/L    Comment: CRITICAL RESULT CALLED TO, READ BACK BY AND VERIFIED WITH: [name],RN 10/06/15 0117 WAYK    Chloride 100 (L) 101 - 111 mmol/L   CO2 22 22 - 32 mmol/L   Glucose, Bld 79 65 - 99 mg/dL   BUN 77 (H) 6 - 20 mg/dL   Creatinine, Ser 15.43 (H) 0.61 - 1.24 mg/dL   Calcium 8.5 (L) 8.9 - 10.3 mg/dL   Total Protein 8.2 (H) 6.5 - 8.1 g/dL   Albumin 3.1 (L) 3.5 - 5.0 g/dL   AST 70 (H) 15 - 41 U/L   ALT 25 17 - 63 U/L   Alkaline Phosphatase 112 38 - 126 U/L   Total Bilirubin 0.7 0.3 - 1.2 mg/dL   GFR calc non Af Amer 3 (L) >60 mL/min   GFR calc Af Amer 4 (L) >60 mL/min    Comment: (NOTE) The eGFR has been calculated using the CKD EPI equation. This calculation has not been validated in all clinical situations. eGFR's persistently <60 mL/min signify possible Chronic Kidney Disease.    Anion gap 19 (H) 5 - 15    Dg Chest 2 View  10/06/2015  CLINICAL DATA:  Acute onset of mid back pain and generalized weakness. Shortness of breath, nausea and vomiting. Initial encounter. EXAM: CHEST  2 VIEW COMPARISON:  Chest radiograph performed 09/23/2015 FINDINGS: The lungs are well-aerated. Vascular congestion is noted. Mildly increased interstitial markings could reflect minimal interstitial edema. There is no evidence of pleural effusion or pneumothorax. The heart is enlarged. A right-sided dual-lumen catheter is noted ending about the cavoatrial junction. No acute osseous abnormalities are seen. IMPRESSION: Vascular congestion and cardiomegaly. Mildly increased interstitial markings could  reflect minimal interstitial edema. Electronically Signed   By: Garald Balding M.D.   On: 10/06/2015 03:04    ROS: pt  sleeping- unwilling or unable to wake up to answer ROS questions Blood pressure 203/160, pulse 72, temperature 98.6 F (37 C), temperature source Oral, resp. rate 12, height 6' 2" (1.88 m), weight 77.735 kg (171 lb 6 oz), SpO2 95 %. General appearance: uncooperative and sleeping Eyes: negative Resp: rales bilaterally Cardio: regular rate and rhythm, S1, S2 normal, no murmur, click, rub or gallop GI: soft, non-tender; bowel sounds normal; no masses,  no organomegaly Extremities: edema 2 plus right sided chest PC- dressing dirty  Assessment/Plan: 50 year old with significant medical issues including a cardiomyopathy and ESRD - noncompliance with HD- essentially getting it only in hospital setting for unclear reasons 1 noncompliance with HD- this is a major issue- not sure what the true problem is- if there could be a SW to see him during this hospitalization to see if something could be worked out to get him to his OP unit to avoid all of these ER trips 2 ESRD: has been coming here pretty regularly - has not been to his unit for at least 2 weeks likely more- this is not ideal and will lead to an early death for him- HD in the AM for K- would benefit from having it on Saturday as well 3 Hypertension: due to volume- UF as able in AM 4. Anemia of ESRD: given blood with HD on 3/7- will give ESA with tx today 5. Metabolic Bone Disease: gets binders and sensipar in the hospital- doubt he takes on the outside    Emerson A 10/06/2015, 3:32 AM
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
## 2015-10-06 NOTE — ED Notes (Signed)
Pt is giving hard time to take his Kayexalate, requested has to be place on a cup for drink it and has to be given with a ginger ale soda, medication given and pt placed on the table states he will take it when he is ready.

## 2015-10-06 NOTE — Discharge Instructions (Signed)
Sciatica °Sciatica is pain, weakness, numbness, or tingling along the path of the sciatic nerve. The nerve starts in the lower back and runs down the back of each leg. The nerve controls the muscles in the lower leg and in the back of the knee, while also providing sensation to the back of the thigh, lower leg, and the sole of your foot. Sciatica is a symptom of another medical condition. For instance, nerve damage or certain conditions, such as a herniated disk or bone spur on the spine, pinch or put pressure on the sciatic nerve. This causes the pain, weakness, or other sensations normally associated with sciatica. Generally, sciatica only affects one side of the body. °CAUSES  °· Herniated or slipped disc. °· Degenerative disk disease. °· A pain disorder involving the narrow muscle in the buttocks (piriformis syndrome). °· Pelvic injury or fracture. °· Pregnancy. °· Tumor (rare). °SYMPTOMS  °Symptoms can vary from mild to very severe. The symptoms usually travel from the low back to the buttocks and down the back of the leg. Symptoms can include: °· Mild tingling or dull aches in the lower back, leg, or hip. °· Numbness in the back of the calf or sole of the foot. °· Burning sensations in the lower back, leg, or hip. °· Sharp pains in the lower back, leg, or hip. °· Leg weakness. °· Severe back pain inhibiting movement. °These symptoms may get worse with coughing, sneezing, laughing, or prolonged sitting or standing. Also, being overweight may worsen symptoms. °DIAGNOSIS  °Your caregiver will perform a physical exam to look for common symptoms of sciatica. He or she may ask you to do certain movements or activities that would trigger sciatic nerve pain. Other tests may be performed to find the cause of the sciatica. These may include: °· Blood tests. °· X-rays. °· Imaging tests, such as an MRI or CT scan. °TREATMENT  °Treatment is directed at the cause of the sciatic pain. Sometimes, treatment is not necessary  and the pain and discomfort goes away on its own. If treatment is needed, your caregiver may suggest: °· Over-the-counter medicines to relieve pain. °· Prescription medicines, such as anti-inflammatory medicine, muscle relaxants, or narcotics. °· Applying heat or ice to the painful area. °· Steroid injections to lessen pain, irritation, and inflammation around the nerve. °· Reducing activity during periods of pain. °· Exercising and stretching to strengthen your abdomen and improve flexibility of your spine. Your caregiver may suggest losing weight if the extra weight makes the back pain worse. °· Physical therapy. °· Surgery to eliminate what is pressing or pinching the nerve, such as a bone spur or part of a herniated disk. °HOME CARE INSTRUCTIONS  °· Only take over-the-counter or prescription medicines for pain or discomfort as directed by your caregiver. °· Apply ice to the affected area for 20 minutes, 3-4 times a day for the first 48-72 hours. Then try heat in the same way. °· Exercise, stretch, or perform your usual activities if these do not aggravate your pain. °· Attend physical therapy sessions as directed by your caregiver. °· Keep all follow-up appointments as directed by your caregiver. °· Do not wear high heels or shoes that do not provide proper support. °· Check your mattress to see if it is too soft. A firm mattress may lessen your pain and discomfort. °SEEK IMMEDIATE MEDICAL CARE IF:  °· You lose control of your bowel or bladder (incontinence). °· You have increasing weakness in the lower back, pelvis, buttocks,   or legs. °· You have redness or swelling of your back. °· You have a burning sensation when you urinate. °· You have pain that gets worse when you lie down or awakens you at night. °· Your pain is worse than you have experienced in the past. °· Your pain is lasting longer than 4 weeks. °· You are suddenly losing weight without reason. °MAKE SURE YOU: °· Understand these  instructions. °· Will watch your condition. °· Will get help right away if you are not doing well or get worse. °  °This information is not intended to replace advice given to you by your health care provider. Make sure you discuss any questions you have with your health care provider. °  °Document Released: 07/02/2001 Document Revised: 03/29/2015 Document Reviewed: 11/17/2011 °Elsevier Interactive Patient Education ©2016 [employer] ° °

## 2015-10-08 DIAGNOSIS — R0602 Shortness of breath: Secondary | ICD-10-CM | POA: Diagnosis not present

## 2015-10-08 DIAGNOSIS — Z992 Dependence on renal dialysis: Secondary | ICD-10-CM | POA: Diagnosis not present

## 2015-10-08 DIAGNOSIS — I132 Hypertensive heart and chronic kidney disease with heart failure and with stage 5 chronic kidney disease, or end stage renal disease: Secondary | ICD-10-CM | POA: Diagnosis not present

## 2015-10-08 DIAGNOSIS — Z7902 Long term (current) use of antithrombotics/antiplatelets: Secondary | ICD-10-CM | POA: Diagnosis not present

## 2015-10-08 DIAGNOSIS — Z79899 Other long term (current) drug therapy: Secondary | ICD-10-CM | POA: Diagnosis not present

## 2015-10-08 DIAGNOSIS — Z7901 Long term (current) use of anticoagulants: Secondary | ICD-10-CM | POA: Diagnosis not present

## 2015-10-08 DIAGNOSIS — Z885 Allergy status to narcotic agent status: Secondary | ICD-10-CM | POA: Diagnosis not present

## 2015-10-08 DIAGNOSIS — E8770 Fluid overload, unspecified: Secondary | ICD-10-CM | POA: Diagnosis not present

## 2015-10-08 DIAGNOSIS — Z86718 Personal history of other venous thrombosis and embolism: Secondary | ICD-10-CM | POA: Diagnosis not present

## 2015-10-08 DIAGNOSIS — G4733 Obstructive sleep apnea (adult) (pediatric): Secondary | ICD-10-CM | POA: Diagnosis not present

## 2015-10-08 DIAGNOSIS — I517 Cardiomegaly: Secondary | ICD-10-CM | POA: Diagnosis not present

## 2015-10-08 DIAGNOSIS — R079 Chest pain, unspecified: Secondary | ICD-10-CM | POA: Diagnosis not present

## 2015-10-08 DIAGNOSIS — Z87891 Personal history of nicotine dependence: Secondary | ICD-10-CM | POA: Diagnosis not present

## 2015-10-08 DIAGNOSIS — Z86711 Personal history of pulmonary embolism: Secondary | ICD-10-CM | POA: Diagnosis not present

## 2015-10-08 DIAGNOSIS — M546 Pain in thoracic spine: Secondary | ICD-10-CM | POA: Diagnosis not present

## 2015-10-08 DIAGNOSIS — Z888 Allergy status to other drugs, medicaments and biological substances status: Secondary | ICD-10-CM | POA: Diagnosis not present

## 2015-10-08 DIAGNOSIS — N186 End stage renal disease: Secondary | ICD-10-CM | POA: Diagnosis not present

## 2015-10-08 DIAGNOSIS — Z7952 Long term (current) use of systemic steroids: Secondary | ICD-10-CM | POA: Diagnosis not present

## 2015-10-08 DIAGNOSIS — R9431 Abnormal electrocardiogram [ECG] [EKG]: Secondary | ICD-10-CM | POA: Diagnosis not present

## 2015-10-08 DIAGNOSIS — I5023 Acute on chronic systolic (congestive) heart failure: Secondary | ICD-10-CM | POA: Diagnosis not present

## 2015-10-08 DIAGNOSIS — K219 Gastro-esophageal reflux disease without esophagitis: Secondary | ICD-10-CM | POA: Diagnosis not present

## 2015-10-08 DIAGNOSIS — Z886 Allergy status to analgesic agent status: Secondary | ICD-10-CM | POA: Diagnosis not present

## 2015-10-08 DIAGNOSIS — Z940 Kidney transplant status: Secondary | ICD-10-CM | POA: Diagnosis not present

## 2015-10-09 DIAGNOSIS — E875 Hyperkalemia: Secondary | ICD-10-CM | POA: Diagnosis not present

## 2015-10-09 DIAGNOSIS — I088 Other rheumatic multiple valve diseases: Secondary | ICD-10-CM | POA: Diagnosis not present

## 2015-10-09 DIAGNOSIS — J40 Bronchitis, not specified as acute or chronic: Secondary | ICD-10-CM | POA: Diagnosis not present

## 2015-10-09 DIAGNOSIS — T8612 Kidney transplant failure: Secondary | ICD-10-CM | POA: Diagnosis not present

## 2015-10-09 DIAGNOSIS — I5189 Other ill-defined heart diseases: Secondary | ICD-10-CM | POA: Diagnosis not present

## 2015-10-09 DIAGNOSIS — R0602 Shortness of breath: Secondary | ICD-10-CM | POA: Diagnosis not present

## 2015-10-09 DIAGNOSIS — Z86711 Personal history of pulmonary embolism: Secondary | ICD-10-CM | POA: Diagnosis not present

## 2015-10-09 DIAGNOSIS — K219 Gastro-esophageal reflux disease without esophagitis: Secondary | ICD-10-CM | POA: Diagnosis present

## 2015-10-09 DIAGNOSIS — N2889 Other specified disorders of kidney and ureter: Secondary | ICD-10-CM | POA: Diagnosis not present

## 2015-10-09 DIAGNOSIS — M546 Pain in thoracic spine: Secondary | ICD-10-CM | POA: Diagnosis not present

## 2015-10-09 DIAGNOSIS — R911 Solitary pulmonary nodule: Secondary | ICD-10-CM | POA: Diagnosis not present

## 2015-10-09 DIAGNOSIS — R079 Chest pain, unspecified: Secondary | ICD-10-CM | POA: Diagnosis not present

## 2015-10-09 DIAGNOSIS — I517 Cardiomegaly: Secondary | ICD-10-CM | POA: Diagnosis not present

## 2015-10-09 DIAGNOSIS — I132 Hypertensive heart and chronic kidney disease with heart failure and with stage 5 chronic kidney disease, or end stage renal disease: Secondary | ICD-10-CM | POA: Diagnosis present

## 2015-10-09 DIAGNOSIS — I120 Hypertensive chronic kidney disease with stage 5 chronic kidney disease or end stage renal disease: Secondary | ICD-10-CM | POA: Diagnosis not present

## 2015-10-09 DIAGNOSIS — Z79899 Other long term (current) drug therapy: Secondary | ICD-10-CM | POA: Diagnosis not present

## 2015-10-09 DIAGNOSIS — I2699 Other pulmonary embolism without acute cor pulmonale: Secondary | ICD-10-CM | POA: Diagnosis not present

## 2015-10-09 DIAGNOSIS — J90 Pleural effusion, not elsewhere classified: Secondary | ICD-10-CM | POA: Diagnosis not present

## 2015-10-09 DIAGNOSIS — Z86718 Personal history of other venous thrombosis and embolism: Secondary | ICD-10-CM | POA: Diagnosis not present

## 2015-10-09 DIAGNOSIS — I5023 Acute on chronic systolic (congestive) heart failure: Secondary | ICD-10-CM | POA: Diagnosis not present

## 2015-10-09 DIAGNOSIS — R0789 Other chest pain: Secondary | ICD-10-CM | POA: Diagnosis not present

## 2015-10-09 DIAGNOSIS — E8770 Fluid overload, unspecified: Secondary | ICD-10-CM | POA: Diagnosis not present

## 2015-10-09 DIAGNOSIS — I160 Hypertensive urgency: Secondary | ICD-10-CM | POA: Diagnosis not present

## 2015-10-09 DIAGNOSIS — N186 End stage renal disease: Secondary | ICD-10-CM | POA: Diagnosis present

## 2015-10-09 DIAGNOSIS — D649 Anemia, unspecified: Secondary | ICD-10-CM | POA: Diagnosis present

## 2015-10-09 DIAGNOSIS — I313 Pericardial effusion (noninflammatory): Secondary | ICD-10-CM | POA: Diagnosis not present

## 2015-10-09 DIAGNOSIS — I519 Heart disease, unspecified: Secondary | ICD-10-CM | POA: Diagnosis not present

## 2015-10-09 DIAGNOSIS — R590 Localized enlarged lymph nodes: Secondary | ICD-10-CM | POA: Diagnosis not present

## 2015-10-09 DIAGNOSIS — G4733 Obstructive sleep apnea (adult) (pediatric): Secondary | ICD-10-CM | POA: Diagnosis present

## 2015-10-09 DIAGNOSIS — Z7901 Long term (current) use of anticoagulants: Secondary | ICD-10-CM | POA: Diagnosis not present

## 2015-10-09 DIAGNOSIS — I10 Essential (primary) hypertension: Secondary | ICD-10-CM | POA: Diagnosis not present

## 2015-10-09 DIAGNOSIS — R1114 Bilious vomiting: Secondary | ICD-10-CM | POA: Diagnosis not present

## 2015-10-09 DIAGNOSIS — B349 Viral infection, unspecified: Secondary | ICD-10-CM | POA: Diagnosis present

## 2015-10-09 DIAGNOSIS — Z992 Dependence on renal dialysis: Secondary | ICD-10-CM | POA: Diagnosis not present

## 2015-10-09 DIAGNOSIS — R9431 Abnormal electrocardiogram [ECG] [EKG]: Secondary | ICD-10-CM | POA: Diagnosis not present

## 2015-10-09 DIAGNOSIS — I509 Heart failure, unspecified: Secondary | ICD-10-CM | POA: Diagnosis present

## 2015-10-09 DIAGNOSIS — R0781 Pleurodynia: Secondary | ICD-10-CM | POA: Diagnosis not present

## 2015-10-13 IMAGING — US US ABDOMEN COMPLETE
1 series · 13 of 25 positions shown · non-contrast
Comparison: CT of the abdomen and pelvis performed 11/05/2014

CLINICAL DATA: Acute onset of midline abdominal pain. Initial
encounter.

EXAM:
ULTRASOUND ABDOMEN COMPLETE

[Series 1: us abdomen complete · 0.25mm/px · 13 of 63 slices shown]
[im 1/63]
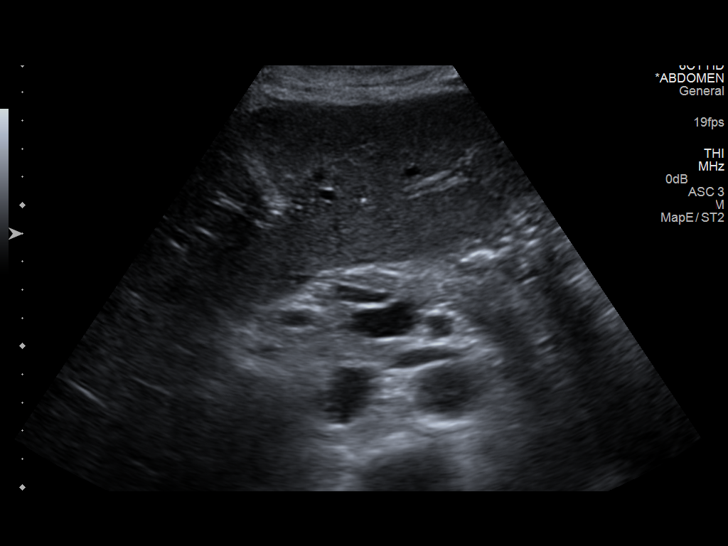
[im 6/63]
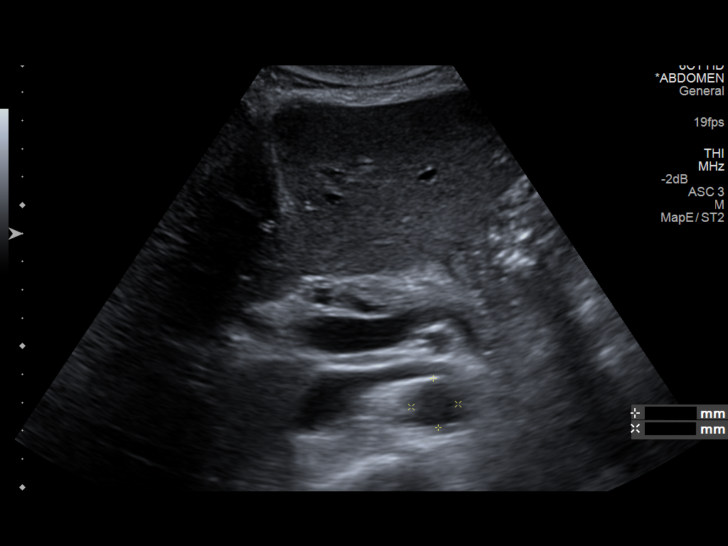
[im 11/63]
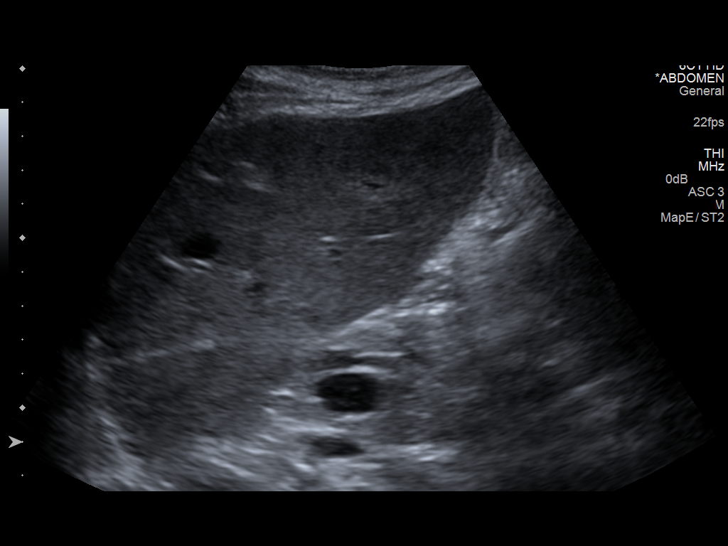
[im 16/63]
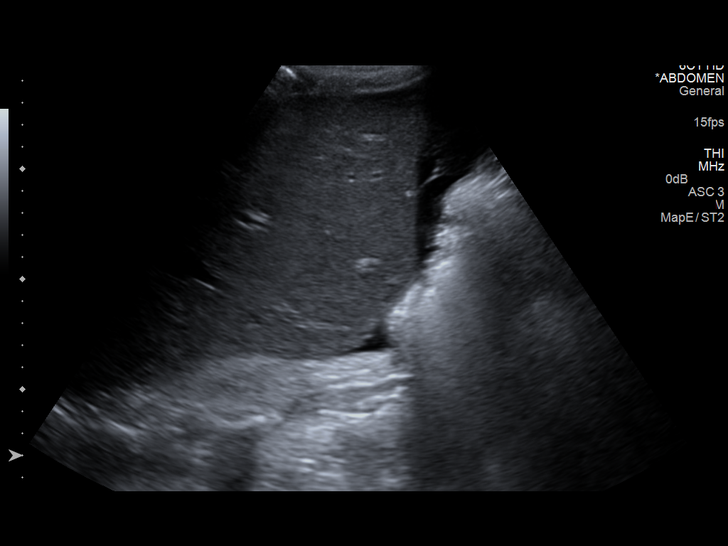
[im 21/63]
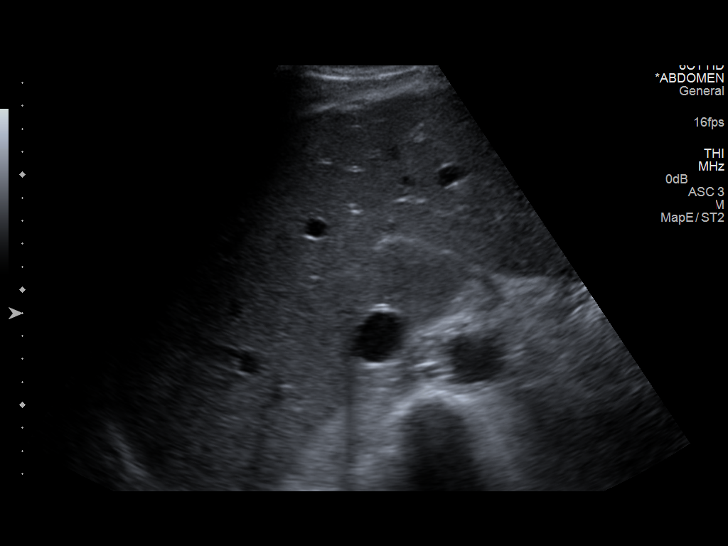
[im 26/63]
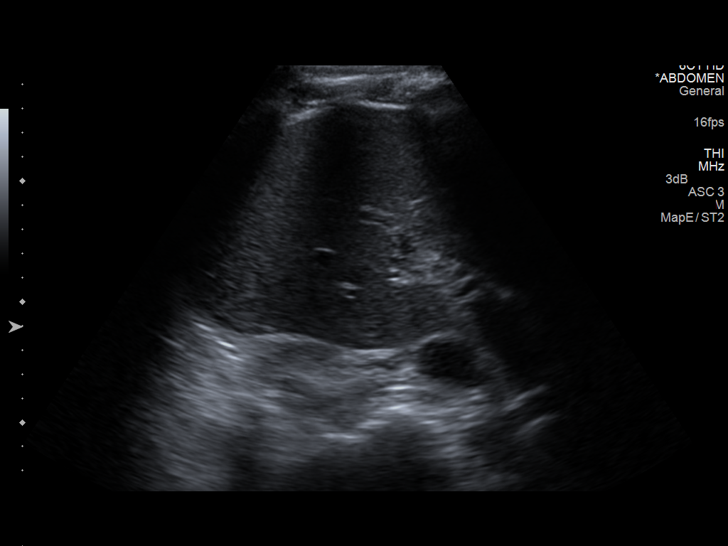
[im 32/63]
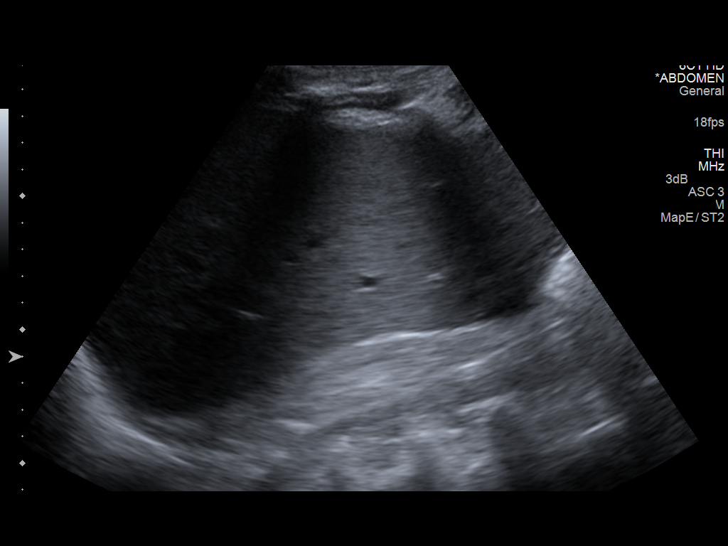
[im 37/63]
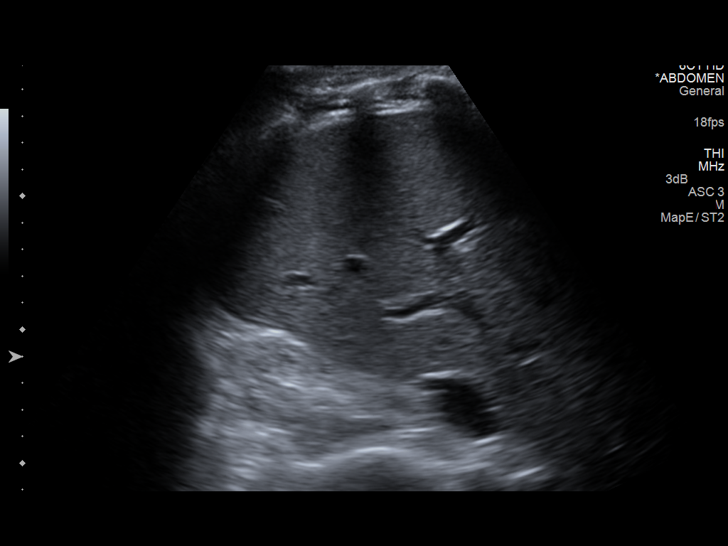
[im 42/63]
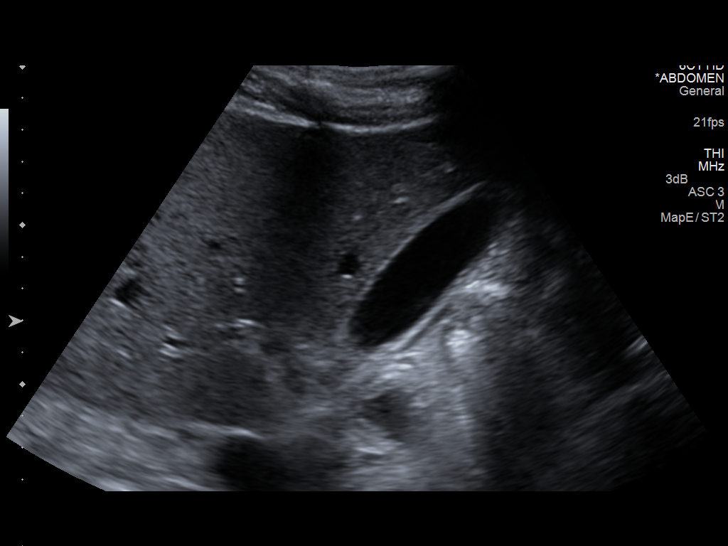
[im 47/63]
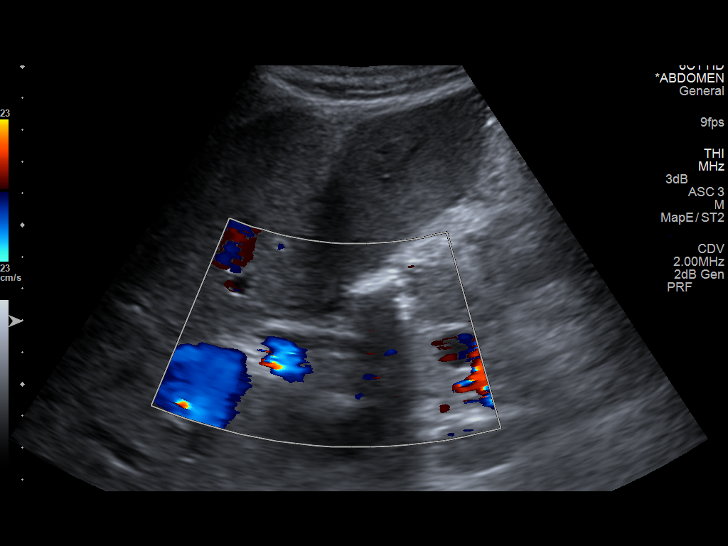
[im 52/63]
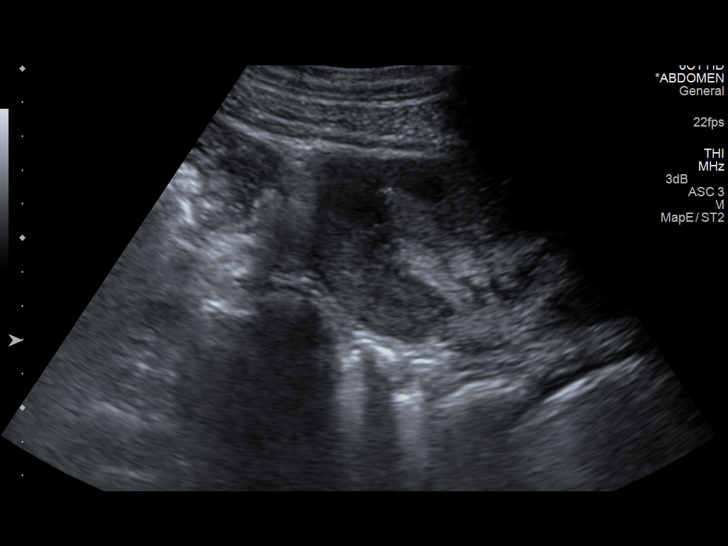
[im 57/63]
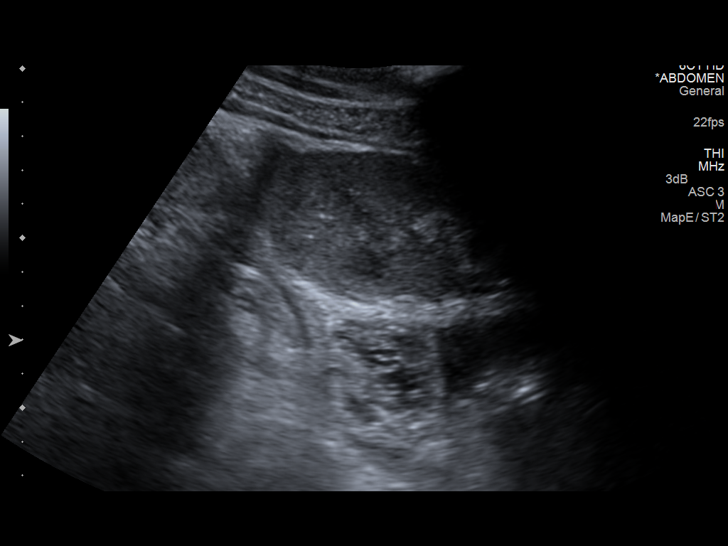
[im 63/63]
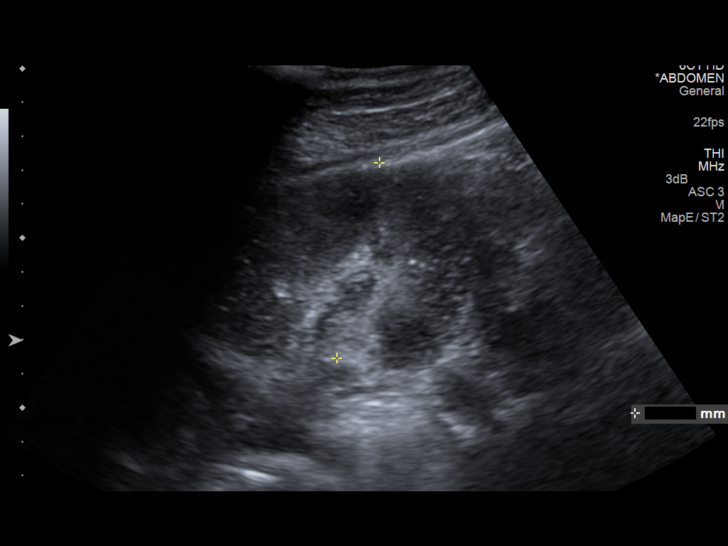

[13 of 25 positions shown; findings below may reference images not displayed]

FINDINGS: Gallbladder: No gallstones or wall thickening visualized. No
sonographic Murphy sign noted.

Common bile duct: Diameter: 0.4 cm, within normal limits in caliber.

Liver: No focal lesion identified. Within normal limits in
parenchymal echogenicity.

IVC: No abnormality visualized.

Pancreas: Visualized portion unremarkable.

Spleen: Not characterized on this study.

Right Kidney: Length: 7.3 cm. Diffusely increased parenchymal
echogenicity, with mild atrophy. A small 1.4 cm cyst is noted at the
upper pole of the right kidney. No hydronephrosis visualized.

Left Kidney:  The native left kidney is not visualized.

Transplant Kidney: Length: 10.6 cm. Echogenicity within normal
limits. No mass or hydronephrosis visualized.

Abdominal aorta: No aneurysm visualized.

Other findings: A small amount of ascites is noted at the left lower
quadrant, with adjacent small bowel loops.
IMPRESSION: 1. No acute abnormality seen to explain the patient's symptoms.
2. Mild atrophy of the native right kidney, with findings of medical
renal disease. Native left kidney not visualized. Transplant kidney
is unremarkable in appearance.
3. Small amount of ascites at the left lower quadrant.

## 2015-10-14 DIAGNOSIS — I120 Hypertensive chronic kidney disease with stage 5 chronic kidney disease or end stage renal disease: Secondary | ICD-10-CM | POA: Diagnosis not present

## 2015-10-14 DIAGNOSIS — R0989 Other specified symptoms and signs involving the circulatory and respiratory systems: Secondary | ICD-10-CM | POA: Diagnosis not present

## 2015-10-14 DIAGNOSIS — Z886 Allergy status to analgesic agent status: Secondary | ICD-10-CM | POA: Diagnosis not present

## 2015-10-14 DIAGNOSIS — Z7901 Long term (current) use of anticoagulants: Secondary | ICD-10-CM | POA: Diagnosis not present

## 2015-10-14 DIAGNOSIS — Z992 Dependence on renal dialysis: Secondary | ICD-10-CM | POA: Diagnosis not present

## 2015-10-14 DIAGNOSIS — Z7952 Long term (current) use of systemic steroids: Secondary | ICD-10-CM | POA: Diagnosis not present

## 2015-10-14 DIAGNOSIS — Z79899 Other long term (current) drug therapy: Secondary | ICD-10-CM | POA: Diagnosis not present

## 2015-10-14 DIAGNOSIS — Z91048 Other nonmedicinal substance allergy status: Secondary | ICD-10-CM | POA: Diagnosis not present

## 2015-10-14 DIAGNOSIS — R0789 Other chest pain: Secondary | ICD-10-CM | POA: Diagnosis not present

## 2015-10-14 DIAGNOSIS — N186 End stage renal disease: Secondary | ICD-10-CM | POA: Diagnosis not present

## 2015-10-14 DIAGNOSIS — Z888 Allergy status to other drugs, medicaments and biological substances status: Secondary | ICD-10-CM | POA: Diagnosis not present

## 2015-10-14 DIAGNOSIS — I517 Cardiomegaly: Secondary | ICD-10-CM | POA: Diagnosis not present

## 2015-10-14 DIAGNOSIS — Z9889 Other specified postprocedural states: Secondary | ICD-10-CM | POA: Diagnosis not present

## 2015-10-16 ENCOUNTER — Observation Stay (HOSPITAL_COMMUNITY): Payer: Medicare Other

## 2015-10-16 ENCOUNTER — Encounter (HOSPITAL_COMMUNITY): Payer: Self-pay | Admitting: Emergency Medicine

## 2015-10-16 ENCOUNTER — Emergency Department (HOSPITAL_COMMUNITY): Payer: Medicare Other

## 2015-10-16 ENCOUNTER — Observation Stay (HOSPITAL_COMMUNITY)
Admission: EM | Admit: 2015-10-16 | Discharge: 2015-10-17 | Discharge status: Against Medical Advice | Payer: Medicare Other | Attending: Internal Medicine | Admitting: Internal Medicine

## 2015-10-16 DIAGNOSIS — Z87891 Personal history of nicotine dependence: Secondary | ICD-10-CM | POA: Diagnosis not present

## 2015-10-16 DIAGNOSIS — Z7901 Long term (current) use of anticoagulants: Secondary | ICD-10-CM | POA: Diagnosis not present

## 2015-10-16 DIAGNOSIS — E875 Hyperkalemia: Secondary | ICD-10-CM | POA: Diagnosis not present

## 2015-10-16 DIAGNOSIS — I120 Hypertensive chronic kidney disease with stage 5 chronic kidney disease or end stage renal disease: Secondary | ICD-10-CM | POA: Diagnosis not present

## 2015-10-16 DIAGNOSIS — R079 Chest pain, unspecified: Secondary | ICD-10-CM | POA: Diagnosis not present

## 2015-10-16 DIAGNOSIS — D649 Anemia, unspecified: Secondary | ICD-10-CM | POA: Diagnosis not present

## 2015-10-16 DIAGNOSIS — E1122 Type 2 diabetes mellitus with diabetic chronic kidney disease: Secondary | ICD-10-CM | POA: Insufficient documentation

## 2015-10-16 DIAGNOSIS — T8612 Kidney transplant failure: Secondary | ICD-10-CM | POA: Insufficient documentation

## 2015-10-16 DIAGNOSIS — E8770 Fluid overload, unspecified: Secondary | ICD-10-CM | POA: Diagnosis not present

## 2015-10-16 DIAGNOSIS — N186 End stage renal disease: Secondary | ICD-10-CM | POA: Insufficient documentation

## 2015-10-16 DIAGNOSIS — Z992 Dependence on renal dialysis: Secondary | ICD-10-CM | POA: Insufficient documentation

## 2015-10-16 DIAGNOSIS — I2782 Chronic pulmonary embolism: Secondary | ICD-10-CM | POA: Diagnosis present

## 2015-10-16 DIAGNOSIS — R1084 Generalized abdominal pain: Secondary | ICD-10-CM

## 2015-10-16 DIAGNOSIS — I5022 Chronic systolic (congestive) heart failure: Secondary | ICD-10-CM | POA: Diagnosis not present

## 2015-10-16 DIAGNOSIS — I132 Hypertensive heart and chronic kidney disease with heart failure and with stage 5 chronic kidney disease, or end stage renal disease: Secondary | ICD-10-CM | POA: Diagnosis not present

## 2015-10-16 DIAGNOSIS — R072 Precordial pain: Secondary | ICD-10-CM | POA: Diagnosis not present

## 2015-10-16 DIAGNOSIS — R0602 Shortness of breath: Secondary | ICD-10-CM | POA: Diagnosis not present

## 2015-10-16 LAB — BASIC METABOLIC PANEL
Anion gap: 15 (ref 5–15)
BUN: 84 mg/dL — ABNORMAL HIGH (ref 6–20)
CO2: 20 mmol/L — ABNORMAL LOW (ref 22–32)
Calcium: 8.7 mg/dL — ABNORMAL LOW (ref 8.9–10.3)
Chloride: 102 mmol/L (ref 101–111)
Creatinine, Ser: 15.37 mg/dL — ABNORMAL HIGH (ref 0.61–1.24)
GFR calc Af Amer: 4 mL/min — ABNORMAL LOW (ref >60)
GFR calc non Af Amer: 3 mL/min — ABNORMAL LOW (ref >60)
Glucose, Bld: 108 mg/dL — ABNORMAL HIGH (ref 65–99)
Potassium: 6.9 mmol/L (ref 3.5–5.1)
Sodium: 137 mmol/L (ref 135–145)

## 2015-10-16 LAB — CBC
HEMATOCRIT: 28 % — AB (ref 39.0–52.0)
HEMOGLOBIN: 8.7 g/dL — AB (ref 13.0–17.0)
MCH: 28.2 pg (ref 26.0–34.0)
MCHC: 31.1 g/dL (ref 30.0–36.0)
MCV: 90.9 fL (ref 78.0–100.0)
Platelets: 187 10*3/uL (ref 150–400)
RBC: 3.08 MIL/uL — ABNORMAL LOW (ref 4.22–5.81)
RDW: 18.4 % — ABNORMAL HIGH (ref 11.5–15.5)
WBC: 6.5 10*3/uL (ref 4.0–10.5)

## 2015-10-16 LAB — PROTIME-INR
INR: 1.92 — ABNORMAL HIGH (ref 0.00–1.49)
Prothrombin Time: 21.9 seconds — ABNORMAL HIGH (ref 11.6–15.2)

## 2015-10-16 LAB — I-STAT TROPONIN, ED: Troponin i, poc: 0.09 ng/mL (ref 0.00–0.08)

## 2015-10-16 LAB — TROPONIN I: TROPONIN I: 0.07 ng/mL — AB (ref <0.031)

## 2015-10-16 MED ORDER — SEVELAMER CARBONATE 800 MG PO TABS
1600.0000 mg | ORAL_TABLET | Freq: Three times a day (TID) | ORAL | Status: DC
  Administered 2015-10-17: 1600 mg via ORAL
  Filled 2015-10-16: qty 2

## 2015-10-16 MED ORDER — CARVEDILOL 6.25 MG PO TABS: 6.2500 mg | ORAL_TABLET | Freq: Two times a day (BID) | ORAL | Status: DC

## 2015-10-16 MED ORDER — LEVALBUTEROL HCL 0.63 MG/3ML IN NEBU
0.6300 mg | INHALATION_SOLUTION | Freq: Four times a day (QID) | RESPIRATORY_TRACT | Status: DC | PRN
  Administered 2015-10-17: 0.63 mg via RESPIRATORY_TRACT
  Filled 2015-10-16: qty 3

## 2015-10-16 MED ORDER — CINACALCET HCL 30 MG PO TABS
30.0000 mg | ORAL_TABLET | Freq: Every day | ORAL | Status: DC
  Filled 2015-10-16: qty 1

## 2015-10-16 MED ORDER — ACETAMINOPHEN 650 MG RE SUPP: 650.0000 mg | Freq: Four times a day (QID) | RECTAL | Status: DC | PRN

## 2015-10-16 MED ORDER — SODIUM BICARBONATE 8.4 % IV SOLN
50.0000 meq | Freq: Once | INTRAVENOUS | Status: AC
  Administered 2015-10-16: 50 meq via INTRAVENOUS
  Filled 2015-10-16: qty 50

## 2015-10-16 MED ORDER — ALBUTEROL SULFATE (2.5 MG/3ML) 0.083% IN NEBU
10.0000 mg | INHALATION_SOLUTION | Freq: Once | RESPIRATORY_TRACT | Status: AC
Start: 2015-10-16 — End: 2015-10-16
  Administered 2015-10-16: 10 mg via RESPIRATORY_TRACT
  Filled 2015-10-16: qty 12

## 2015-10-16 MED ORDER — PANTOPRAZOLE SODIUM 40 MG PO TBEC
40.0000 mg | DELAYED_RELEASE_TABLET | Freq: Every day | ORAL | Status: DC
  Administered 2015-10-17: 40 mg via ORAL
  Filled 2015-10-16: qty 1

## 2015-10-16 MED ORDER — ACETAMINOPHEN 325 MG PO TABS: 650.0000 mg | ORAL_TABLET | Freq: Four times a day (QID) | ORAL | Status: DC | PRN

## 2015-10-16 MED ORDER — SEVELAMER CARBONATE 800 MG PO TABS: 800.0000 mg | ORAL_TABLET | ORAL | Status: DC | PRN

## 2015-10-16 MED ORDER — RENA-VITE PO TABS
1.0000 | ORAL_TABLET | Freq: Every day | ORAL | Status: DC
  Administered 2015-10-16: 1 via ORAL
  Filled 2015-10-16: qty 1

## 2015-10-16 MED ORDER — ONDANSETRON HCL 4 MG PO TABS: 4.0000 mg | ORAL_TABLET | Freq: Four times a day (QID) | ORAL | Status: DC | PRN

## 2015-10-16 MED ORDER — ALPRAZOLAM 0.5 MG PO TABS
0.5000 mg | ORAL_TABLET | Freq: Two times a day (BID) | ORAL | Status: DC | PRN
Start: 2015-10-16 — End: 2015-10-17

## 2015-10-16 MED ORDER — MORPHINE SULFATE (PF) 2 MG/ML IV SOLN
1.0000 mg | INTRAVENOUS | Status: DC | PRN
  Administered 2015-10-16 – 2015-10-17 (×2): 1 mg via INTRAVENOUS
  Filled 2015-10-16 (×2): qty 1

## 2015-10-16 MED ORDER — ISOSORBIDE MONONITRATE ER 60 MG PO TB24
90.0000 mg | ORAL_TABLET | Freq: Every day | ORAL | Status: DC
  Administered 2015-10-17: 90 mg via ORAL
  Filled 2015-10-16: qty 1

## 2015-10-16 MED ORDER — GABAPENTIN 100 MG PO CAPS
100.0000 mg | ORAL_CAPSULE | Freq: Three times a day (TID) | ORAL | Status: DC
  Administered 2015-10-16: 100 mg via ORAL
  Filled 2015-10-16 (×2): qty 1

## 2015-10-16 MED ORDER — HYDRALAZINE HCL 50 MG PO TABS
100.0000 mg | ORAL_TABLET | Freq: Three times a day (TID) | ORAL | Status: DC
  Administered 2015-10-16 – 2015-10-17 (×2): 100 mg via ORAL
  Filled 2015-10-16 (×2): qty 2

## 2015-10-16 MED ORDER — DOXERCALCIFEROL 4 MCG/2ML IV SOLN
2.5000 ug | INTRAVENOUS | Status: DC
  Administered 2015-10-17: 2.5 ug via INTRAVENOUS
  Filled 2015-10-16: qty 2

## 2015-10-16 MED ORDER — AMLODIPINE BESYLATE 10 MG PO TABS
10.0000 mg | ORAL_TABLET | Freq: Every day | ORAL | Status: DC
  Administered 2015-10-17: 10 mg via ORAL
  Filled 2015-10-16: qty 1

## 2015-10-16 MED ORDER — DOCUSATE SODIUM 100 MG PO CAPS
100.0000 mg | ORAL_CAPSULE | Freq: Two times a day (BID) | ORAL | Status: DC
  Filled 2015-10-16: qty 1

## 2015-10-16 MED ORDER — HYDRALAZINE HCL 20 MG/ML IJ SOLN
10.0000 mg | INTRAMUSCULAR | Status: DC | PRN
  Administered 2015-10-17: 10 mg via INTRAVENOUS
  Filled 2015-10-16: qty 1

## 2015-10-16 MED ORDER — ATORVASTATIN CALCIUM 40 MG PO TABS
40.0000 mg | ORAL_TABLET | Freq: Every day | ORAL | Status: DC
  Administered 2015-10-16: 40 mg via ORAL
  Filled 2015-10-16: qty 1

## 2015-10-16 MED ORDER — SEVELAMER CARBONATE 800 MG PO TABS: 800.0000 mg | ORAL_TABLET | Freq: Three times a day (TID) | ORAL | Status: DC

## 2015-10-16 MED ORDER — ASPIRIN EC 81 MG PO TBEC
81.0000 mg | DELAYED_RELEASE_TABLET | Freq: Every day | ORAL | Status: DC
  Filled 2015-10-16: qty 1

## 2015-10-16 MED ORDER — HEPARIN SODIUM (PORCINE) 5000 UNIT/ML IJ SOLN: 5000.0000 [IU] | Freq: Three times a day (TID) | INTRAMUSCULAR | Status: DC

## 2015-10-16 MED ORDER — PREDNISONE 5 MG PO TABS: 5.0000 mg | ORAL_TABLET | Freq: Every day | ORAL | Status: DC

## 2015-10-16 MED ORDER — METHOCARBAMOL 500 MG PO TABS: 500.0000 mg | ORAL_TABLET | Freq: Three times a day (TID) | ORAL | Status: DC | PRN

## 2015-10-16 MED ORDER — FENTANYL CITRATE (PF) 100 MCG/2ML IJ SOLN
50.0000 ug | Freq: Once | INTRAMUSCULAR | Status: AC
  Administered 2015-10-16: 50 ug via INTRAMUSCULAR
  Filled 2015-10-16: qty 2

## 2015-10-16 MED ORDER — ONDANSETRON 4 MG PO TBDP
8.0000 mg | ORAL_TABLET | Freq: Once | ORAL | Status: AC
Start: 2015-10-16 — End: 2015-10-16
  Administered 2015-10-16: 8 mg via ORAL
  Filled 2015-10-16: qty 2

## 2015-10-16 MED ORDER — ONDANSETRON HCL 4 MG/2ML IJ SOLN
4.0000 mg | Freq: Four times a day (QID) | INTRAMUSCULAR | Status: DC | PRN
Start: 2015-10-16 — End: 2015-10-17
  Administered 2015-10-17: 4 mg via INTRAVENOUS
  Filled 2015-10-16: qty 2

## 2015-10-16 MED ORDER — SODIUM POLYSTYRENE SULFONATE 15 GM/60ML PO SUSP
60.0000 g | Freq: Once | ORAL | Status: AC
  Administered 2015-10-16: 60 g via ORAL
  Filled 2015-10-16: qty 240

## 2015-10-16 NOTE — ED Notes (Signed)
Pt sts CP and SOB; pt sts last dialysis was Friday; pt sts recent hospitalization

## 2015-10-16 NOTE — H&P (Signed)
[facility] History and Physical  MARL SEAGO QAE:[id number] DOB: Mar 14, 1966 DOA: 10/16/2015  Referring physician: Dr.Lockwood. PCP: [name] Patient  Specialists: Nephrologist.  Chief Complaint: Chest pain.  HPI: Frank [name] is a 50 y.o. male with history of ESRD on hemodialysis on Tuesday Thursday Saturday, hypertension, history of PE on Coumadin, CHF presents to the ER because of increasing chest pain. Patient states his last dialysis on Friday 4 days ago. Chest pain has been there since Saturday 3 days ago which is throbbing in nature retrosternal radiating to the back and lower extremities. Chest x-ray shows congestion and patient does have mild shortness of breath. Patient's EKG shows normal sinus rhythm with nonspecific T-wave changes. Cardiac markers is mildly elevated. Patient's blood pressure was markedly elevated. Patient was given Kayexalate for his elevated potassium and on-call nephrologist Dr. Florene Glen has been consulted. Patient has been admitted for further management of his hypokalemia and chest pain. Patient was recently admitted for similar complaints. Patient denies any nausea vomiting abdominal pain or diarrhea.  Review of Systems: As presented in the history of presenting illness, rest negative.  Past Medical History  Diagnosis Date  . Hypertension   . Depression   . Complication of anesthesia     itching, sore throat  . Shortness of breath   . Anxiety   . CHF (congestive heart failure) ([place])   . Anemia   . Dialysis patient ([place])   . Type II diabetes mellitus (HCC)     No history per patient, but remains under history as A1c would not be accurate given on dialysis  . ESRD (end stage renal disease) ([place])     due to HTN per patient, followed at [facility], s/p failed kidney transplant - dialysis Tue, Th, Sat  . Renal insufficiency    Past Surgical History  Procedure Laterality Date  . Kidney receipient  2006    failed and started HD in March 2014  . Capd  insertion    . Capd removal    . Left heart catheterization with coronary angiogram N/A 09/02/2014    Procedure: LEFT HEART CATHETERIZATION WITH CORONARY ANGIOGRAM;  Surgeon: [name], MD;  Location: [facility] CATH LAB;  Service: Cardiovascular;  Laterality: N/A;  . Inguinal hernia repair Right 02/14/2015    Procedure: REPAIR INCARCERATED RIGHT INGUINAL HERNIA;  Surgeon: [name], MD;  Location: [place];  Service: General;  Laterality: Right;  . Insertion of dialysis catheter Right 09/23/2015    Procedure: exchange of Right internal Dialysis Catheter.;  Surgeon: [name], MD;  Location: [facility] OR;  Service: Vascular;  Laterality: Right;   Social History:  reports that he has quit smoking. His smoking use included Cigarettes. He smoked 0.00 packs per day for 1 year. He has never used smokeless tobacco. He reports that he does not drink alcohol or use illicit drugs. Where does patient live home. Can patient participate in ADLs? Yes.  Allergies  Allergen Reactions  . Butalbital-Apap-Caffeine Shortness Of Breath and Swelling    Swelling in throat  . Ferrlecit [Na Ferric Gluc Cplx In Sucrose] Shortness Of Breath, Swelling and Other (See Comments)    Swelling in throat  . Minoxidil Shortness Of Breath  . Darvocet [Propoxyphene N-Acetaminophen] Hives  . Propoxyphene Hives    Allergy listed on file from [place] (Pt did not mention any allergies)     Family History:  Family History  Problem Relation Age of Onset  . Hypertension Other  Prior to Admission medications   Medication Sig Start Date End Date Taking? Authorizing Provider  ALPRAZolam [name]) 0.5 MG tablet Take 1 tablet (0.5 mg total) by mouth 2 (two) times daily as needed for anxiety. 09/11/15  Yes [name], MD  amLODipine (NORVASC) 10 MG tablet Take 10 mg by mouth daily.   Yes Historical Provider, MD  atorvastatin (LIPITOR) 40 MG tablet Take 1 tablet (40 mg total) by mouth daily at 6 PM. 09/02/14  Yes Barton Dubois,  MD  carvedilol (COREG) 6.25 MG tablet Take 1 tablet (6.25 mg total) by mouth 2 (two) times daily with a meal. 09/27/15  Yes [name], MD  cinacalcet (SENSIPAR) 30 MG tablet Take 30 mg by mouth daily.   Yes Historical Provider, MD  docusate sodium (COLACE) 100 MG capsule Take 1 capsule (100 mg total) by mouth 2 (two) times daily. 11/19/14  Yes [name], MD  doxercalciferol (HECTOROL) 4 MCG/2ML injection Inject 1.25 mLs (2.5 mcg total) into the vein Every Tuesday,Thursday,and Saturday with dialysis. 10/10/14  Yes [name], DO  hydrALAZINE (APRESOLINE) 50 MG tablet Take 2 tablets (100 mg total) by mouth every 8 (eight) hours. 09/11/15  Yes [name], MD  hydrocerin (EUCERIN) CREA Apply 1 application topically 2 (two) times daily. 02/04/15  Yes [name], MD  HYDROcodone-acetaminophen (NORCO/VICODIN) 5-325 MG tablet Take 1 tablet by mouth every 6 (six) hours as needed. Patient taking differently: Take 1 tablet by mouth every 6 (six) hours as needed for moderate pain.  09/27/15  Yes [name], MD  hydrOXYzine (ATARAX/VISTARIL) 10 MG tablet Take 1 tablet (10 mg total) by mouth 3 (three) times daily as needed for itching. 09/27/15  Yes [name], MD  ibuprofen (ADVIL,MOTRIN) 600 MG tablet Take 1 tablet (600 mg total) by mouth every 8 (eight) hours as needed. 10/06/15  Yes [name], MD  isosorbide mononitrate (IMDUR) 30 MG 24 hr tablet Take 3 tablets (90 mg total) by mouth daily. 09/11/15  Yes [name], MD  levalbuterol [facility] HFA) 45 MCG/ACT inhaler Inhale 2 puffs into the lungs every 6 (six) hours as needed for wheezing or shortness of breath. 11/19/14  Yes [name], MD  lisinopril (PRINIVIL,ZESTRIL) 20 MG tablet Take 1 tablet (20 mg total) by mouth daily. 02/04/15  Yes [name], MD  methocarbamol (ROBAXIN) 500 MG tablet Take 1 tablet (500 mg total) by mouth every 8 (eight) hours as needed for muscle spasms. 10/06/15  Yes [name], MD   multivitamin (RENA-VIT) TABS tablet Take 1 tablet by mouth at bedtime. 02/04/15  Yes [name], MD  omeprazole (PRILOSEC) 20 MG capsule Take 20 mg by mouth daily. 07/01/13  Yes Historical Provider, MD  predniSONE (DELTASONE) 5 MG tablet Take 5 mg by mouth daily with breakfast.   Yes Historical Provider, MD  sevelamer carbonate (RENVELA) 800 MG tablet Take 800-1,600 mg by mouth 3 (three) times daily with meals. 2 tabs three times daily with meals, and 1 tablet with snacks   Yes Historical Provider, MD  warfarin (COUMADIN) 6 MG tablet Take 1.5 tablets (9 mg total) by mouth daily at 6 PM. Take daily until adjusted per MD 09/12/15  Yes [name], MD  gabapentin (NEURONTIN) 100 MG capsule Take 100 mg by mouth 3 (three) times daily. 06/14/15 10/06/15  Historical Provider, MD    Physical Exam: Filed Vitals:   10/16/15 2015 10/16/15 2030 10/16/15 2202 10/16/15 2203  BP:  169/114 173/118 175/115 186/110  Pulse:   78 76  Temp:   97.8 F (36.6 C)   TempSrc:   Oral   Resp: 17 13    SpO2:   95% 92%     General:  Moderately built and nourished.  Eyes: Anicteric. No pallor.  ENT: No discharge from the ears eyes nose and mouth.  Neck: No mass felt. No JVD appreciated.  Cardiovascular: S1 and S2 heard.  Respiratory: No rhonchi or crepitations.  Abdomen: Soft nontender bowel sounds present. No guarding or rigidity.  Skin: No rash.  Musculoskeletal: Mild bilateral lower extremity edema.  Psychiatric: Appears normal.  Neurologic: Alert awake oriented to time place and person. Moves all extremities.  Labs on Admission:  Basic Metabolic Panel:  Recent Labs Lab 10/16/15 1556  NA 137  K 6.9*  CL 102  CO2 20*  GLUCOSE 108*  BUN 84*  CREATININE 15.37*  CALCIUM 8.7*   Liver Function Tests: No results for input(s): AST, ALT, ALKPHOS, BILITOT, PROT, ALBUMIN in the last 168 hours. No results for input(s): LIPASE, AMYLASE in the last 168 hours. No results for input(s): AMMONIA  in the last 168 hours. CBC:  Recent Labs Lab 10/16/15 1556  WBC 6.5  HGB 8.7*  HCT 28.0*  MCV 90.9  PLT 187   Cardiac Enzymes: No results for input(s): CKTOTAL, CKMB, CKMBINDEX, TROPONINI in the last 168 hours.  BNP (last 3 results)  Recent Labs  01/26/15 0421 07/17/15 2135 09/24/15 0324  BNP >4500.0* >4500.0* >4500.0*    ProBNP (last 3 results) No results for input(s): PROBNP in the last 8760 hours.  CBG: No results for input(s): GLUCAP in the last 168 hours.  Radiological Exams on Admission: Dg Chest 2 View  10/16/2015  CLINICAL DATA:  Chest pain with shortness of breath. Dialysis 3 days ago. Recent hospitalization. EXAM: CHEST  2 VIEW COMPARISON:  10/06/2015 and 09/23/2015. FINDINGS: 1546 hours. Right IJ dialysis catheters are unchanged at the SVC right atrial level. The mediastinal contours and cardiomegaly appear unchanged. There is mild chronic vascular congestion without overt pulmonary edema, confluent airspace opacity or significant pleural effusion. The bones appear unchanged. IMPRESSION: Cardiomegaly with chronic vascular congestion. No edema or other definite acute findings. Electronically Signed   By: Richardean Sale M.D.   On: 10/16/2015 15:58    EKG: Independently reviewed. Normal sinus rhythm with nonspecific ST-T changes.  Assessment/Plan Principal Problem:   Chest pain Active Problems:   ESRD on dialysis- Tues, Thurs, Sat in [facility], nephro: [name]   Hyperkalemia   Chronic pulmonary embolism (HCC)   Hypertensive urgency   Pain in the chest   1. Chest pain - appears atypical but since patient chest pain radiates to the back and lower extremity I have ordered CT scan of the chest to rule out dissection. Cycle cardiac markers. With the patient's chest pain will improve with control of blood pressure in dialysis. 2. Hypertensive urgency - we'll continue home medications but will hold lisinopril secondary to hypokalemia. When necessary IV  hydralazine for now. I think blood pressure will improve with dialysis. 3. ESRD on hemodialysis with hyperkalemia - appreciate nephrology consult. Nephrology planning dialysis in the morning. Patient did receive Kayexalate for hyperkalemia. Holding off lisinopril secondary to hypokalemia. They'll diet. Closely follow intake output metabolic panel. 4. History of PE on Coumadin. Coumadin per pharmacy. 5. Chronic anemia - follow CBC. 6. Records state the patient is diabetic but patient states he does not take any diabetic medications  now. 7. History of CHF - fluid management per nephrology.   DVT Prophylaxis Coumadin per pharmacy.  Code Status: Full code.  Family Communication: Discussed with patient.  Disposition Plan: Admit for observation.    Frank [name] N. [facility] Pager [phone].  If 7PM-7AM, please contact night-coverage [email or web address] Password [facility] 10/16/2015, 10:07 PM

## 2015-10-16 NOTE — ED Notes (Signed)
EDP aware of patient's POTASSIUM of 6.9.

## 2015-10-16 NOTE — ED Notes (Signed)
Elevated Troponin called from lab  0.09.  Melissa S. Charge nurse made aware.

## 2015-10-16 NOTE — Consult Note (Signed)
HPI: The patient is a 50 y.o. year-old with PMH sig for DM, HTN, ESRD, CHF (EF 25%), anemia, and noncompliance with dialysis. Has Dialysis home unit is in [facility] but doesn't show up there very often. His last inpatient treatment at [facility] was 10/06/15.  He reports a treatment at his dialysis unit on Friday but I cannot verify that treatment.  He presents now overloaded, SOB and hyperkalemic without EKG changes. CXR shows cardiomegaly and mild edema.  Past Medical History  Diagnosis Date  . Hypertension   . Depression   . Complication of anesthesia     itching, sore throat  . Diabetes mellitus without complication ([place])     No history per patient, but remains under history as A1c would not be accurate given on dialysis  . Shortness of breath   . Anxiety   . ESRD (end stage renal disease) ([place])     due to HTN per patient, followed at [facility], s/p failed kidney transplant - dialysis Tue, Th, Sat  . Renal insufficiency   . CHF (congestive heart failure) ([place])   . Anemia   . Dialysis patient [facility])    Past Surgical History  Procedure Laterality Date  . Kidney receipient  2006    failed and started HD in March 2014  . Capd insertion    . Capd removal    . Left heart catheterization with coronary angiogram N/A 09/02/2014    Procedure: LEFT HEART CATHETERIZATION WITH CORONARY ANGIOGRAM;  Surgeon: [name], MD;  Location: [facility] CATH LAB;  Service: Cardiovascular;  Laterality: N/A;  . Inguinal hernia repair Right 02/14/2015    Procedure: REPAIR INCARCERATED RIGHT INGUINAL HERNIA;  Surgeon: [name], MD;  Location: [place];  Service: General;  Laterality: Right;  . Insertion of dialysis catheter Right 09/23/2015    Procedure: exchange of Right internal Dialysis Catheter.;  Surgeon: [name], MD;  Location: [facility] OR;  Service: Vascular;  Laterality: Right;   Social History:  reports that he has quit smoking. His smoking use included Cigarettes. He smoked 0.00 packs per day for 1 year. He  has never used smokeless tobacco. He reports that he does not drink alcohol or use illicit drugs. Allergies:  Allergies  Allergen Reactions  . Butalbital-Apap-Caffeine Shortness Of Breath and Swelling    Swelling in throat  . Ferrlecit [Na Ferric Gluc Cplx In Sucrose] Shortness Of Breath, Swelling and Other (See Comments)    Swelling in throat  . Minoxidil Shortness Of Breath  . Darvocet [Propoxyphene N-Acetaminophen] Hives  . Propoxyphene Hives    Allergy listed on file from [place] (Pt did not mention any allergies)    Family History  Problem Relation Age of Onset  . Hypertension Other     Medications:  Prior to Admission:  Prescriptions prior to admission  Medication Sig Dispense Refill Last Dose  . ALPRAZolam (XANAX) 0.5 MG tablet Take 1 tablet (0.5 mg total) by mouth 2 (two) times daily as needed for anxiety. 10 tablet 0 10/15/2015 at Unknown time  . amLODipine (NORVASC) 10 MG tablet Take 10 mg by mouth daily.   10/16/2015 at Unknown time  . atorvastatin (LIPITOR) 40 MG tablet Take 1 tablet (40 mg total) by mouth daily at 6 PM. 30 tablet 1 10/15/2015 at Unknown time  . carvedilol (COREG) 6.25 MG tablet Take 1 tablet (6.25 mg total) by mouth 2 (two) times daily with a meal. 60 tablet 3 10/16/2015 at 0700  . cinacalcet (SENSIPAR) 30 MG  tablet Take 30 mg by mouth daily.   10/16/2015 at Unknown time  . docusate sodium (COLACE) 100 MG capsule Take 1 capsule (100 mg total) by mouth 2 (two) times daily. 60 capsule 0 10/15/2015 at Unknown time  . doxercalciferol (HECTOROL) 4 MCG/2ML injection Inject 1.25 mLs (2.5 mcg total) into the vein Every Tuesday,Thursday,and Saturday with dialysis. 2 mL  10/14/2015  . hydrALAZINE (APRESOLINE) 50 MG tablet Take 2 tablets (100 mg total) by mouth every 8 (eight) hours. 90 tablet 1 10/16/2015 at Unknown time  . hydrocerin (EUCERIN) CREA Apply 1 application topically 2 (two) times daily. 454 g 0 10/15/2015 at Unknown time  . HYDROcodone-acetaminophen  (NORCO/VICODIN) 5-325 MG tablet Take 1 tablet by mouth every 6 (six) hours as needed. (Patient taking differently: Take 1 tablet by mouth every 6 (six) hours as needed for moderate pain. ) 10 tablet 0 10/16/2015 at Unknown time  . hydrOXYzine (ATARAX/VISTARIL) 10 MG tablet Take 1 tablet (10 mg total) by mouth 3 (three) times daily as needed for itching. 30 tablet 0 PRN  . ibuprofen (ADVIL,MOTRIN) 600 MG tablet Take 1 tablet (600 mg total) by mouth every 8 (eight) hours as needed. 15 tablet 0 PRN  . isosorbide mononitrate (IMDUR) 30 MG 24 hr tablet Take 3 tablets (90 mg total) by mouth daily. 90 tablet 0 10/16/2015 at Unknown time  . levalbuterol (XOPENEX HFA) 45 MCG/ACT inhaler Inhale 2 puffs into the lungs every 6 (six) hours as needed for wheezing or shortness of breath. 1 Inhaler 2 PRN  . lisinopril (PRINIVIL,ZESTRIL) 20 MG tablet Take 1 tablet (20 mg total) by mouth daily. 30 tablet 1 10/16/2015 at Unknown time  . methocarbamol (ROBAXIN) 500 MG tablet Take 1 tablet (500 mg total) by mouth every 8 (eight) hours as needed for muscle spasms. 12 tablet 0 10/15/2015 at Unknown time  . multivitamin (RENA-VIT) TABS tablet Take 1 tablet by mouth at bedtime. 30 tablet 1 10/15/2015 at Unknown time  . omeprazole (PRILOSEC) 20 MG capsule Take 20 mg by mouth daily.   10/16/2015 at Unknown time  . predniSONE (DELTASONE) 5 MG tablet Take 5 mg by mouth daily with breakfast.   10/16/2015 at Unknown time  . sevelamer carbonate (RENVELA) 800 MG tablet Take 800-1,600 mg by mouth 3 (three) times daily with meals. 2 tabs three times daily with meals, and 1 tablet with snacks   10/16/2015 at Unknown time  . warfarin (COUMADIN) 6 MG tablet Take 1.5 tablets (9 mg total) by mouth daily at 6 PM. Take daily until adjusted per MD 20 tablet 0 10/15/2015 at Unknown time  . gabapentin (NEURONTIN) 100 MG capsule Take 100 mg by mouth 3 (three) times daily.   10/05/2015 at Unknown time   ROS: as per HPI  Blood pressure 173/118, pulse 60,  temperature 97.9 F (36.6 C), temperature source Oral, resp. rate 13, SpO2 100 %.  General appearance: alert and cooperative Head: Normocephalic, without obvious abnormality, atraumatic, neck veins bulging Nose: Nares normal. Septum midline. Mucosa normal. No drainage or sinus tenderness. Throat: lips, mucosa, and tongue normal; teeth and gums normal Resp: diminished breath sounds bilaterally Chest wall: no tenderness Cardio: regular rate and rhythm, S1, S2 normal, no murmur, click, rub or gallop GI: soft, non-tender; bowel sounds normal; no masses,  no organomegaly Extremities: edema 2-3+ Skin: Skin color, texture, turgor normal. No rashes or lesions Neurologic: Grossly normal Results for orders placed or performed during the hospital encounter of 10/16/15 (from the past 48 hour(s))  I-stat troponin, ED (not at [facility])     Status: Abnormal   Collection Time: 10/16/15  3:46 PM  Result Value Ref Range   Troponin i, poc 0.09 (HH) 0.00 - 0.08 ng/mL   Comment NOTIFIED PHYSICIAN    Comment 3            Comment: Due to the release kinetics of cTnI, a negative result within the first hours of the onset of symptoms does not rule out myocardial infarction with certainty. If myocardial infarction is still suspected, repeat the test at appropriate intervals.   Basic metabolic panel     Status: Abnormal   Collection Time: 10/16/15  3:56 PM  Result Value Ref Range   Sodium 137 135 - 145 mmol/L   Potassium 6.9 (HH) 3.5 - 5.1 mmol/L    Comment: NO VISIBLE HEMOLYSIS CRITICAL RESULT CALLED TO, READ BACK BY AND VERIFIED WITH: B MILLER,RN 1631 10/16/15 D BRADLEY    Chloride 102 101 - 111 mmol/L   CO2 20 (L) 22 - 32 mmol/L   Glucose, Bld 108 (H) 65 - 99 mg/dL   BUN 84 (H) 6 - 20 mg/dL   Creatinine, Ser 15.37 (H) 0.61 - 1.24 mg/dL   Calcium 8.7 (L) 8.9 - 10.3 mg/dL   GFR calc non Af Amer 3 (L) >60 mL/min   GFR calc Af Amer 4 (L) >60 mL/min    Comment: (NOTE) The eGFR has been calculated  using the CKD EPI equation. This calculation has not been validated in all clinical situations. eGFR's persistently <60 mL/min signify possible Chronic Kidney Disease.    Anion gap 15 5 - 15  CBC     Status: Abnormal   Collection Time: 10/16/15  3:56 PM  Result Value Ref Range   WBC 6.5 4.0 - 10.5 K/uL   RBC 3.08 (L) 4.22 - 5.81 MIL/uL   Hemoglobin 8.7 (L) 13.0 - 17.0 g/dL   HCT 28.0 (L) 39.0 - 52.0 %   MCV 90.9 78.0 - 100.0 fL   MCH 28.2 26.0 - 34.0 pg   MCHC 31.1 30.0 - 36.0 g/dL   RDW 18.4 (H) 11.5 - 15.5 %   Platelets 187 150 - 400 K/uL   Dg Chest 2 View  10/16/2015  CLINICAL DATA:  Chest pain with shortness of breath. Dialysis 3 days ago. Recent hospitalization. EXAM: CHEST  2 VIEW COMPARISON:  10/06/2015 and 09/23/2015. FINDINGS: 1546 hours. Right IJ dialysis catheters are unchanged at the SVC right atrial level. The mediastinal contours and cardiomegaly appear unchanged. There is mild chronic vascular congestion without overt pulmonary edema, confluent airspace opacity or significant pleural effusion. The bones appear unchanged. IMPRESSION: Cardiomegaly with chronic vascular congestion. No edema or other definite acute findings. Electronically Signed   By: Richardean Sale M.D.   On: 10/16/2015 15:58    Assessment:  1 ESRD 2 Hyperkalemia 3 Volume overload 4 Non-adherence to dialysis Plan: 1 Kayexalate tonight 2 Hemodialysis to follow 3 Follow up with outpt HD unit  Danie Hannig C 10/16/2015, 9:43 PM

## 2015-10-16 NOTE — Progress Notes (Signed)
Received report from [facility] ED nurse

## 2015-10-16 NOTE — ED Provider Notes (Signed)
CSN: [id number]     Arrival date & time 10/16/15  1505 History   First MD Initiated Contact with Patient 10/16/15 1622     Chief Complaint  Patient presents with  . Chest Pain     HPI  Patient presents with concern of chest pain and dyspnea. Symptoms symptoms began over the past 24 hours, without clear precipitant. Patient has multiple medical issues, including end-stage renal disease, last had dialysis 4 days ago per He notes that since onset, over the past 24 hours he has had worsening overall discomfort, as well as focal chest discomfort in the sternum, sore, moderate, nonradiating. There is associated dyspnea. He notes he has had similar episodes multiple times, typically improved with dialysis. He denies nausea, vomiting, diarrhea, confusion, disorientation, syncope, fever, chills.   Past Medical History  Diagnosis Date  . Hypertension   . Depression   . Complication of anesthesia     itching, sore throat  . Diabetes mellitus without complication ([place])     No history per patient, but remains under history as A1c would not be accurate given on dialysis  . Shortness of breath   . Anxiety   . ESRD (end stage renal disease) ([place])     due to HTN per patient, followed at [facility], s/p failed kidney transplant - dialysis Tue, Th, Sat  . Renal insufficiency   . CHF (congestive heart failure) ([place])   . Anemia   . Dialysis patient [facility])    Past Surgical History  Procedure Laterality Date  . Kidney receipient  2006    failed and started HD in March 2014  . Capd insertion    . Capd removal    . Left heart catheterization with coronary angiogram N/A 09/02/2014    Procedure: LEFT HEART CATHETERIZATION WITH CORONARY ANGIOGRAM;  Surgeon: [name], MD;  Location: [facility] CATH LAB;  Service: Cardiovascular;  Laterality: N/A;  . Inguinal hernia repair Right 02/14/2015    Procedure: REPAIR INCARCERATED RIGHT INGUINAL HERNIA;  Surgeon: [name], MD;  Location: [place];  Service: General;   Laterality: Right;  . Insertion of dialysis catheter Right 09/23/2015    Procedure: exchange of Right internal Dialysis Catheter.;  Surgeon: [name], MD;  Location: [facility] OR;  Service: Vascular;  Laterality: Right;   Family History  Problem Relation Age of Onset  . Hypertension Other    Social History  Substance Use Topics  . Smoking status: Former Smoker -- 0.00 packs/day for 1 years    Types: Cigarettes  . Smokeless tobacco: Never Used     Comment: quit Jan 2014  . Alcohol Use: No    Review of Systems  Constitutional:       Per HPI, otherwise negative  HENT:       Per HPI, otherwise negative  Respiratory:       Per HPI, otherwise negative  Cardiovascular:       Per HPI, otherwise negative  Gastrointestinal: Negative for vomiting.  Endocrine:       Negative aside from HPI  Genitourinary:       Neg aside from HPI   Musculoskeletal:       Per HPI, otherwise negative  Skin: Negative.   Neurological: Negative for syncope.      Allergies  Butalbital-apap-caffeine; Ferrlecit; Minoxidil; Darvocet; and Propoxyphene  Home Medications   Prior to Admission medications   Medication Sig Start Date End Date Taking? Authorizing Provider  ALPRAZolam [name]) 0.5 MG tablet Take 1 tablet (0.5  mg total) by mouth 2 (two) times daily as needed for anxiety. 09/11/15  Yes [name], MD  amLODipine (NORVASC) 10 MG tablet Take 10 mg by mouth daily.   Yes Historical Provider, MD  atorvastatin (LIPITOR) 40 MG tablet Take 1 tablet (40 mg total) by mouth daily at 6 PM. 09/02/14  Yes Barton Dubois, MD  carvedilol (COREG) 6.25 MG tablet Take 1 tablet (6.25 mg total) by mouth 2 (two) times daily with a meal. 09/27/15  Yes [name], MD  cinacalcet (SENSIPAR) 30 MG tablet Take 30 mg by mouth daily.   Yes Historical Provider, MD  docusate sodium (COLACE) 100 MG capsule Take 1 capsule (100 mg total) by mouth 2 (two) times daily. 11/19/14  Yes [name], MD  doxercalciferol  (HECTOROL) 4 MCG/2ML injection Inject 1.25 mLs (2.5 mcg total) into the vein Every Tuesday,Thursday,and Saturday with dialysis. 10/10/14  Yes [name], DO  hydrALAZINE (APRESOLINE) 50 MG tablet Take 2 tablets (100 mg total) by mouth every 8 (eight) hours. 09/11/15  Yes [name], MD  hydrocerin (EUCERIN) CREA Apply 1 application topically 2 (two) times daily. 02/04/15  Yes [name], MD  HYDROcodone-acetaminophen (NORCO/VICODIN) 5-325 MG tablet Take 1 tablet by mouth every 6 (six) hours as needed. Patient taking differently: Take 1 tablet by mouth every 6 (six) hours as needed for moderate pain.  09/27/15  Yes [name], MD  hydrOXYzine (ATARAX/VISTARIL) 10 MG tablet Take 1 tablet (10 mg total) by mouth 3 (three) times daily as needed for itching. 09/27/15  Yes [name], MD  ibuprofen (ADVIL,MOTRIN) 600 MG tablet Take 1 tablet (600 mg total) by mouth every 8 (eight) hours as needed. 10/06/15  Yes [name], MD  isosorbide mononitrate (IMDUR) 30 MG 24 hr tablet Take 3 tablets (90 mg total) by mouth daily. 09/11/15  Yes [name], MD  levalbuterol [facility] HFA) 45 MCG/ACT inhaler Inhale 2 puffs into the lungs every 6 (six) hours as needed for wheezing or shortness of breath. 11/19/14  Yes [name], MD  lisinopril (PRINIVIL,ZESTRIL) 20 MG tablet Take 1 tablet (20 mg total) by mouth daily. 02/04/15  Yes [name], MD  methocarbamol (ROBAXIN) 500 MG tablet Take 1 tablet (500 mg total) by mouth every 8 (eight) hours as needed for muscle spasms. 10/06/15  Yes [name], MD  multivitamin (RENA-VIT) TABS tablet Take 1 tablet by mouth at bedtime. 02/04/15  Yes [name], MD  omeprazole (PRILOSEC) 20 MG capsule Take 20 mg by mouth daily. 07/01/13  Yes Historical Provider, MD  predniSONE (DELTASONE) 5 MG tablet Take 5 mg by mouth daily with breakfast.   Yes Historical Provider, MD  sevelamer carbonate (RENVELA) 800 MG tablet Take 800-1,600 mg by mouth 3 (three)  times daily with meals. 2 tabs three times daily with meals, and 1 tablet with snacks   Yes Historical Provider, MD  warfarin (COUMADIN) 6 MG tablet Take 1.5 tablets (9 mg total) by mouth daily at 6 PM. Take daily until adjusted per MD 09/12/15  Yes [name], MD  gabapentin (NEURONTIN) 100 MG capsule Take 100 mg by mouth 3 (three) times daily. 06/14/15 10/06/15  Historical Provider, MD   BP 178/123 mmHg  Pulse 60  Temp(Src) 97.9 F (36.6 C) (Oral)  Resp 18  SpO2 95% Physical Exam  Constitutional: He is oriented to person, place, and time. He appears well-developed and well-nourished.  HENT:  Head: Normocephalic and  atraumatic.  Eyes: EOM are normal.  Neck: Normal range of motion.  Cardiovascular: Normal rate, regular rhythm and intact distal pulses.   Pulmonary/Chest: Effort normal and breath sounds normal. No respiratory distress.  Dialysis access in his right upper chest without surrounding signs of infection.  Abdominal: Soft. He exhibits no distension. There is no tenderness.  Musculoskeletal:  No appreciable deformity, the patient was alternately spontaneously.  Neurological: He is alert and oriented to person, place, and time.  Skin: Skin is warm and dry.  Psychiatric: He has a normal mood and affect. Judgment normal.  Nursing note and vitals reviewed.   ED Course  Procedures (including critical care time) Labs Review Labs Reviewed  BASIC METABOLIC PANEL - Abnormal; Notable for the following:    Potassium 6.9 (*)    CO2 20 (*)    Glucose, Bld 108 (*)    BUN 84 (*)    Creatinine, Ser 15.37 (*)    Calcium 8.7 (*)    GFR calc non Af Amer 3 (*)    GFR calc Af Amer 4 (*)    All other components within normal limits  CBC - Abnormal; Notable for the following:    RBC 3.08 (*)    Hemoglobin 8.7 (*)    HCT 28.0 (*)    RDW 18.4 (*)    All other components within normal limits  I-STAT TROPOININ, ED - Abnormal; Notable for the following:    Troponin i, poc 0.09 (*)     All other components within normal limits    Imaging Review Dg Chest 2 View  10/16/2015  CLINICAL DATA:  Chest pain with shortness of breath. Dialysis 3 days ago. Recent hospitalization. EXAM: CHEST  2 VIEW COMPARISON:  10/06/2015 and 09/23/2015. FINDINGS: 1546 hours. Right IJ dialysis catheters are unchanged at the SVC right atrial level. The mediastinal contours and cardiomegaly appear unchanged. There is mild chronic vascular congestion without overt pulmonary edema, confluent airspace opacity or significant pleural effusion. The bones appear unchanged. IMPRESSION: Cardiomegaly with chronic vascular congestion. No edema or other definite acute findings. Electronically Signed   By: Richardean Sale M.D.   On: 10/16/2015 15:58   I have personally reviewed and evaluated these images and lab results as part of my medical decision-making.   EKG Interpretation   Date/Time:  Monday October 16 2015 15:11:38 EDT Ventricular Rate:  60 PR Interval:  204 QRS Duration: 96 QT Interval:  470 QTC Calculation: 470 R Axis:   -11 Text Interpretation:  Normal sinus rhythm Possible Anterior infarct , age  undetermined Abnormal ECG Sinus rhythm T wave abnormality No significant  change since last tracing Abnormal ekg Confirmed by Carmin Muskrat  MD  [phone]) on 10/16/2015 4:25:30 PM     Review notable for 20 visits in the past 6 months, admission earlier this month. On repeat exam patient appears in similar condition. Initial labs notable for elevated troponin, and potassium of 6.5. I discussed patient's case with our nephrology colleagues, patient will receive Kayexalate, is awaiting dialysis.  Patient will also receive albuterol, bicarbonate MDM  This patient with multiple medical issues, including inconsistent follow-up with his dialysis center is now presents with chest pain, dyspnea, and is found to have fluid overload status, with elevated troponin, hyperkalemia. Patient's EKG is not remarkably  different from prior, which is mildly reassuring. However, given the patient's hyperkalemia, elevated troponin, his history of multiple medical issues, he will require dialysis. I discussed this case with nephrologists, and our hospitalist team  for admission.   CRITICAL CARE Performed by: Carmin Muskrat Total critical care time: 40 minutes Critical care time was exclusive of separately billable procedures and treating other patients. Critical care was necessary to treat or prevent imminent or life-threatening deterioration. Critical care was time spent personally by me on the following activities: development of treatment plan with patient and/or surrogate as well as nursing, discussions with consultants, evaluation of patient's response to treatment, examination of patient, obtaining history from patient or surrogate, ordering and performing treatments and interventions, ordering and review of laboratory studies, ordering and review of radiographic studies, pulse oximetry and re-evaluation of patient's condition.   Carmin Muskrat, MD 10/16/15 [phone]

## 2015-10-17 ENCOUNTER — Observation Stay (HOSPITAL_COMMUNITY): Payer: Medicare Other

## 2015-10-17 DIAGNOSIS — N186 End stage renal disease: Secondary | ICD-10-CM

## 2015-10-17 DIAGNOSIS — E8770 Fluid overload, unspecified: Secondary | ICD-10-CM | POA: Diagnosis not present

## 2015-10-17 DIAGNOSIS — E875 Hyperkalemia: Secondary | ICD-10-CM | POA: Diagnosis not present

## 2015-10-17 DIAGNOSIS — R079 Chest pain, unspecified: Secondary | ICD-10-CM | POA: Diagnosis not present

## 2015-10-17 DIAGNOSIS — Z992 Dependence on renal dialysis: Secondary | ICD-10-CM

## 2015-10-17 DIAGNOSIS — I120 Hypertensive chronic kidney disease with stage 5 chronic kidney disease or end stage renal disease: Secondary | ICD-10-CM | POA: Diagnosis not present

## 2015-10-17 DIAGNOSIS — I160 Hypertensive urgency: Secondary | ICD-10-CM

## 2015-10-17 DIAGNOSIS — I2782 Chronic pulmonary embolism: Secondary | ICD-10-CM | POA: Diagnosis not present

## 2015-10-17 LAB — BASIC METABOLIC PANEL
ANION GAP: 17 — AB (ref 5–15)
BUN: 89 mg/dL — ABNORMAL HIGH (ref 6–20)
CHLORIDE: 101 mmol/L (ref 101–111)
CO2: 20 mmol/L — AB (ref 22–32)
Calcium: 8.7 mg/dL — ABNORMAL LOW (ref 8.9–10.3)
Creatinine, Ser: 16.09 mg/dL — ABNORMAL HIGH (ref 0.61–1.24)
GFR calc Af Amer: 3 mL/min — ABNORMAL LOW (ref >60)
GFR, EST NON AFRICAN AMERICAN: 3 mL/min — AB (ref >60)
GLUCOSE: 81 mg/dL (ref 65–99)
POTASSIUM: 5.4 mmol/L — AB (ref 3.5–5.1)
Sodium: 138 mmol/L (ref 135–145)

## 2015-10-17 LAB — MRSA PCR SCREENING: MRSA by PCR: NEGATIVE

## 2015-10-17 LAB — CBC
HEMATOCRIT: 28.6 % — AB (ref 39.0–52.0)
HEMOGLOBIN: 9 g/dL — AB (ref 13.0–17.0)
MCH: 28.2 pg (ref 26.0–34.0)
MCHC: 31.5 g/dL (ref 30.0–36.0)
MCV: 89.7 fL (ref 78.0–100.0)
PLATELETS: 204 10*3/uL (ref 150–400)
RBC: 3.19 MIL/uL — ABNORMAL LOW (ref 4.22–5.81)
RDW: 17.9 % — ABNORMAL HIGH (ref 11.5–15.5)
WBC: 7.6 10*3/uL (ref 4.0–10.5)

## 2015-10-17 LAB — PROTIME-INR
INR: 1.99 — ABNORMAL HIGH (ref 0.00–1.49)
PROTHROMBIN TIME: 22.5 s — AB (ref 11.6–15.2)

## 2015-10-17 LAB — TROPONIN I: Troponin I: 0.07 ng/mL — ABNORMAL HIGH (ref <0.031)

## 2015-10-17 MED ORDER — LIDOCAINE HCL (PF) 1 % IJ SOLN: 5.0000 mL | INTRAMUSCULAR | Status: DC | PRN

## 2015-10-17 MED ORDER — HEPARIN SODIUM (PORCINE) 1000 UNIT/ML DIALYSIS: 1000.0000 [IU] | INTRAMUSCULAR | Status: DC | PRN

## 2015-10-17 MED ORDER — SODIUM CHLORIDE 0.9 % IV SOLN: 100.0000 mL | INTRAVENOUS | Status: DC | PRN

## 2015-10-17 MED ORDER — WARFARIN - PHARMACIST DOSING INPATIENT: Freq: Every day | Status: DC

## 2015-10-17 MED ORDER — WARFARIN SODIUM 6 MG PO TABS
9.0000 mg | ORAL_TABLET | Freq: Once | ORAL | Status: DC
  Filled 2015-10-17: qty 1

## 2015-10-17 MED ORDER — HEPARIN SODIUM (PORCINE) 1000 UNIT/ML DIALYSIS: 20.0000 [IU]/kg | INTRAMUSCULAR | Status: DC | PRN

## 2015-10-17 MED ORDER — LIDOCAINE-PRILOCAINE 2.5-2.5 % EX CREA: 1.0000 "application " | TOPICAL_CREAM | CUTANEOUS | Status: DC | PRN

## 2015-10-17 MED ORDER — WARFARIN SODIUM 6 MG PO TABS
12.0000 mg | ORAL_TABLET | ORAL | Status: AC
  Administered 2015-10-17: 12 mg via ORAL
  Filled 2015-10-17: qty 2

## 2015-10-17 MED ORDER — DOXERCALCIFEROL 4 MCG/2ML IV SOLN
INTRAVENOUS | Status: AC
  Administered 2015-10-17: 2.5 ug via INTRAVENOUS
  Filled 2015-10-17: qty 2

## 2015-10-17 MED ORDER — PROCHLORPERAZINE EDISYLATE 5 MG/ML IJ SOLN
10.0000 mg | Freq: Once | INTRAMUSCULAR | Status: AC
  Administered 2015-10-17: 10 mg via INTRAVENOUS
  Filled 2015-10-17: qty 2

## 2015-10-17 MED ORDER — PENTAFLUOROPROP-TETRAFLUOROETH EX AERO: 1.0000 "application " | INHALATION_SPRAY | CUTANEOUS | Status: DC | PRN

## 2015-10-17 MED ORDER — ALTEPLASE 2 MG IJ SOLR: 2.0000 mg | Freq: Once | INTRAMUSCULAR | Status: DC | PRN

## 2015-10-17 MED ORDER — HYDRALAZINE HCL 20 MG/ML IJ SOLN: 10.0000 mg | Freq: Once | INTRAMUSCULAR | Status: DC

## 2015-10-17 NOTE — Progress Notes (Signed)
ANTICOAGULATION CONSULT NOTE - Initial Consult  Pharmacy Consult for Coumadin Indication: h/o PE  Allergies  Allergen Reactions  . Butalbital-Apap-Caffeine Shortness Of Breath and Swelling    Swelling in throat  . Ferrlecit [Na Ferric Gluc Cplx In Sucrose] Shortness Of Breath, Swelling and Other (See Comments)    Swelling in throat  . Minoxidil Shortness Of Breath  . Darvocet [Propoxyphene N-Acetaminophen] Hives  . Propoxyphene Hives    Allergy listed on file from [facility] (Pt did not mention any allergies)     Vital Signs: Temp: 97.8 F (36.6 C) (03/27 2202) Temp Source: Oral (03/27 2202) BP: 186/110 mmHg (03/27 2203) Pulse Rate: 76 (03/27 2203)  Labs:  Recent Labs  10/16/15 1556 10/16/15 2231  HGB 8.7*  --   HCT 28.0*  --   PLT 187  --   LABPROT  --  21.9*  INR  --  1.92*  CREATININE 15.37*  --   TROPONINI  --  0.07*    Estimated Creatinine Clearance: 6 mL/min (by C-G formula based on Cr of 15.37).   Medical History: Past Medical History  Diagnosis Date  . Hypertension   . Depression   . Complication of anesthesia     itching, sore throat  . Shortness of breath   . Anxiety   . CHF (congestive heart failure) ([place])   . Anemia   . Dialysis patient ([place])   . Type II diabetes mellitus (HCC)     No history per patient, but remains under history as A1c would not be accurate given on dialysis  . ESRD (end stage renal disease) ([place])     due to HTN per patient, followed at [facility], s/p failed kidney transplant - dialysis Tue, Th, Sat  . Renal insufficiency     Medications:  Prescriptions prior to admission  Medication Sig Dispense Refill Last Dose  . ALPRAZolam (XANAX) 0.5 MG tablet Take 1 tablet (0.5 mg total) by mouth 2 (two) times daily as needed for anxiety. 10 tablet 0 10/15/2015 at Unknown time  . amLODipine (NORVASC) 10 MG tablet Take 10 mg by mouth daily.   10/16/2015 at Unknown time  . atorvastatin (LIPITOR) 40 MG tablet Take 1 tablet (40 mg total)  by mouth daily at 6 PM. 30 tablet 1 10/15/2015 at Unknown time  . carvedilol (COREG) 6.25 MG tablet Take 1 tablet (6.25 mg total) by mouth 2 (two) times daily with a meal. 60 tablet 3 10/16/2015 at 0700  . cinacalcet (SENSIPAR) 30 MG tablet Take 30 mg by mouth daily.   10/16/2015 at Unknown time  . docusate sodium (COLACE) 100 MG capsule Take 1 capsule (100 mg total) by mouth 2 (two) times daily. 60 capsule 0 10/15/2015 at Unknown time  . doxercalciferol (HECTOROL) 4 MCG/2ML injection Inject 1.25 mLs (2.5 mcg total) into the vein Every Tuesday,Thursday,and Saturday with dialysis. 2 mL  10/14/2015  . hydrALAZINE (APRESOLINE) 50 MG tablet Take 2 tablets (100 mg total) by mouth every 8 (eight) hours. 90 tablet 1 10/16/2015 at Unknown time  . hydrocerin (EUCERIN) CREA Apply 1 application topically 2 (two) times daily. 454 g 0 10/15/2015 at Unknown time  . HYDROcodone-acetaminophen (NORCO/VICODIN) 5-325 MG tablet Take 1 tablet by mouth every 6 (six) hours as needed. (Patient taking differently: Take 1 tablet by mouth every 6 (six) hours as needed for moderate pain. ) 10 tablet 0 10/16/2015 at Unknown time  . hydrOXYzine (ATARAX/VISTARIL) 10 MG tablet Take 1 tablet (10 mg total) by mouth 3 (  three) times daily as needed for itching. 30 tablet 0 PRN  . ibuprofen (ADVIL,MOTRIN) 600 MG tablet Take 1 tablet (600 mg total) by mouth every 8 (eight) hours as needed. 15 tablet 0 PRN  . isosorbide mononitrate (IMDUR) 30 MG 24 hr tablet Take 3 tablets (90 mg total) by mouth daily. 90 tablet 0 10/16/2015 at Unknown time  . levalbuterol (XOPENEX HFA) 45 MCG/ACT inhaler Inhale 2 puffs into the lungs every 6 (six) hours as needed for wheezing or shortness of breath. 1 Inhaler 2 PRN  . lisinopril (PRINIVIL,ZESTRIL) 20 MG tablet Take 1 tablet (20 mg total) by mouth daily. 30 tablet 1 10/16/2015 at Unknown time  . methocarbamol (ROBAXIN) 500 MG tablet Take 1 tablet (500 mg total) by mouth every 8 (eight) hours as needed for muscle  spasms. 12 tablet 0 10/15/2015 at Unknown time  . multivitamin (RENA-VIT) TABS tablet Take 1 tablet by mouth at bedtime. 30 tablet 1 10/15/2015 at Unknown time  . omeprazole (PRILOSEC) 20 MG capsule Take 20 mg by mouth daily.   10/16/2015 at Unknown time  . predniSONE (DELTASONE) 5 MG tablet Take 5 mg by mouth daily with breakfast.   10/16/2015 at Unknown time  . sevelamer carbonate (RENVELA) 800 MG tablet Take 800-1,600 mg by mouth 3 (three) times daily with meals. 2 tabs three times daily with meals, and 1 tablet with snacks   10/16/2015 at Unknown time  . warfarin (COUMADIN) 6 MG tablet Take 1.5 tablets (9 mg total) by mouth daily at 6 PM. Take daily until adjusted per MD 20 tablet 0 10/15/2015 at Unknown time  . gabapentin (NEURONTIN) 100 MG capsule Take 100 mg by mouth 3 (three) times daily.   10/05/2015 at Unknown time   Scheduled:  . amLODipine  10 mg Oral Daily  . aspirin EC  81 mg Oral Daily  . atorvastatin  40 mg Oral q1800  . carvedilol  6.25 mg Oral BID WC  . cinacalcet  30 mg Oral Q breakfast  . docusate sodium  100 mg Oral BID  . doxercalciferol  2.5 mcg Intravenous Q T,Th,Sa-HD  . gabapentin  100 mg Oral TID  . hydrALAZINE  100 mg Oral 3 times per day  . isosorbide mononitrate  90 mg Oral Daily  . multivitamin  1 tablet Oral QHS  . pantoprazole  40 mg Oral Daily  . predniSONE  5 mg Oral Q breakfast  . sevelamer carbonate  1,600 mg Oral TID WC  . warfarin  12 mg Oral NOW  . Warfarin - Pharmacist Dosing Inpatient   Does not apply q1800    Assessment: 50yo male c/o CP, to continue Coumadin during admission for further w/u; current INR slightly below goal w/ last dose taken 3/26.  Goal of Therapy:  INR 2-3   Plan:  Will give boosted Coumadin dose of 43m po x1 now and monitor INR for dose adjustments.  VWynona Neat PharmD, BCPS  10/17/2015,12:09 AM

## 2015-10-17 NOTE — Care Management Obs Status (Signed)
[facility] NOTIFICATION   Patient Details  Name: Frank [name] MRN: [id number] Date of Birth: 11-05-1965   Medicare Observation Status Notification Given:  Other (see comment) Patient left AMA prior to notice being given    Carles Collet, RN 10/17/2015, 3:33 PM

## 2015-10-17 NOTE — Progress Notes (Signed)
[facility] PROGRESS NOTE  Frank [name] TDD:[id number] DOB: 09-Jan-1966 DOA: 10/16/2015  PCP: [name] Patient  Brief HPI: 50 year old African-American male with a past medical history of end-stage renal disease on dialysis, but is noncompliant with this treatment, history of PE on Coumadin, presented with complaints of chest pain. Apparently, his last dialysis was Friday. Patient's blood pressure was noted to be markedly elevated. He was noted to have hyperkalemia. He was hospitalized for further management.  Past medical history:  Past Medical History  Diagnosis Date  . Hypertension   . Depression   . Complication of anesthesia     itching, sore throat  . Shortness of breath   . Anxiety   . CHF (congestive heart failure) ([place])   . Anemia   . Dialysis patient ([place])   . Type II diabetes mellitus (HCC)     No history per patient, but remains under history as A1c would not be accurate given on dialysis  . ESRD (end stage renal disease) ([place])     due to HTN per patient, followed at [facility], s/p failed kidney transplant - dialysis Tue, Th, Sat  . Renal insufficiency     Consultants: Nephrology  Procedures: None  Antibiotics: None  Subjective: Patient seen at dialysis. He wasn't very communicative. Not very cooperative with examination. Did say that he was experiencing some chest pain. But unable to specify any further.  Objective: Vital Signs  Filed Vitals:   10/17/15 1050 10/17/15 1105 10/17/15 1120 10/17/15 1209  BP: 178/106 182/105 194/115 181/95  Pulse: 87 86 85 92  Temp:   98.2 F (36.8 C) 97.9 F (36.6 C)  TempSrc:   Oral   Resp: _0 Weight:   81.4 kg (179 lb 7.3 oz)   SpO2:    95%    Intake/Output Summary (Last 24 hours) at 10/17/15 1321 Last data filed at 10/17/15 1110  Gross per 24 hour  Intake      0 ml  Output   3696 ml  Net  -3696 ml   Filed Weights   10/17/15 0545 10/17/15 0649 10/17/15 1120  Weight: 79.3 kg (174 lb 13.2 oz)  85.1 kg (187 lb 9.8 oz) 81.4 kg (179 lb 7.3 oz)    General appearance: appears stated age, no distress and uncooperative Resp: Diminished air entry to the bases. No crackles or wheezing. Cardio: regular rate and rhythm, S1, S2 normal, no murmur, click, rub or gallop GI: soft, non-tender; bowel sounds normal; no masses,  no organomegaly Extremities: Edema noted bilateral lower extremities, pitting. Neurologic: Somewhat lethargic. Easily arousable. Not fully cooperative.  Lab Results:  Basic Metabolic Panel:  Recent Labs Lab 10/16/15 1556 10/17/15 0556  NA 137 138  K 6.9* 5.4*  CL 102 101  CO2 20* 20*  GLUCOSE 108* 81  BUN 84* 89*  CREATININE 15.37* 16.09*  CALCIUM 8.7* 8.7*   CBC:  Recent Labs Lab 10/16/15 1556 10/17/15 0556  WBC 6.5 7.6  HGB 8.7* 9.0*  HCT 28.0* 28.6*  MCV 90.9 89.7  PLT 187 204   Cardiac Enzymes:  Recent Labs Lab 10/16/15 2231 10/17/15 0556  TROPONINI 0.07* 0.07*   BNP (last 3 results)  Recent Labs  01/26/15 0421 07/17/15 2135 09/24/15 0324  BNP >4500.0* >4500.0* >4500.0*    Recent Results (from the past 240 hour(s))  MRSA PCR Screening     Status: None   Collection Time: 10/16/15  9:52 PM  Result Value Ref Range Status  MRSA by PCR NEGATIVE NEGATIVE Final    Comment:        The GeneXpert MRSA Assay (FDA approved for NASAL specimens only), is one component of a comprehensive MRSA colonization surveillance program. It is not intended to diagnose MRSA infection nor to guide or monitor treatment for MRSA infections.       Studies/Results: Dg Chest 2 View  10/16/2015  CLINICAL DATA:  Chest pain with shortness of breath. Dialysis 3 days ago. Recent hospitalization. EXAM: CHEST  2 VIEW COMPARISON:  10/06/2015 and 09/23/2015. FINDINGS: 1546 hours. Right IJ dialysis catheters are unchanged at the SVC right atrial level. The mediastinal contours and cardiomegaly appear unchanged. There is mild chronic vascular congestion without  overt pulmonary edema, confluent airspace opacity or significant pleural effusion. The bones appear unchanged. IMPRESSION: Cardiomegaly with chronic vascular congestion. No edema or other definite acute findings. Electronically Signed   By: Richardean Sale M.D.   On: 10/16/2015 15:58    Medications:  Scheduled: . amLODipine  10 mg Oral Daily  . aspirin EC  81 mg Oral Daily  . atorvastatin  40 mg Oral q1800  . carvedilol  6.25 mg Oral BID WC  . cinacalcet  30 mg Oral Q breakfast  . docusate sodium  100 mg Oral BID  . doxercalciferol  2.5 mcg Intravenous Q T,Th,Sa-HD  . gabapentin  100 mg Oral TID  . hydrALAZINE  10 mg Intravenous Once  . hydrALAZINE  100 mg Oral 3 times per day  . isosorbide mononitrate  90 mg Oral Daily  . multivitamin  1 tablet Oral QHS  . pantoprazole  40 mg Oral Daily  . predniSONE  5 mg Oral Q breakfast  . sevelamer carbonate  1,600 mg Oral TID WC  . Warfarin - Pharmacist Dosing Inpatient   Does not apply q1800   Continuous:  DPO:[id number] **OR** acetaminophen, ALPRAZolam, hydrALAZINE, levalbuterol, methocarbamol, morphine injection, ondansetron **OR** ondansetron (ZOFRAN) IV, sevelamer carbonate  Assessment/Plan:  Principal Problem:   Chest pain Active Problems:   ESRD on dialysis- Tues, Thurs, Sat in [facility] [employer], nephro: [name]   Hyperkalemia   Chronic pulmonary embolism (HCC)   Hypertensive urgency   Pain in the chest    Chest pain Chest pain appeared to be atypical to the admitting M.D. CT scan was recommended. However, patient has declined. No mediastinal widening noted on chest x-ray. His elevated blood pressure and the fact that he has not had dialysis in many days could've contributed. Troponins are noted to be mildly elevated, but these are chronic for this patient. Reassess his symptoms after dialysis and with blood pressure control. EKG did show ST depression and T inversion in lead V6. Could have been due to electrolyte abnormalities and  elevated blood pressure.  Hypertensive urgency Continue home medications. When necessary IV hydralazine for now. Patient is likely noncompliant with his medication regimen.  ESRD on hemodialysis with hyperkalemia Dialyzed this morning. Patient received Kayexalate for hyperkalemia. Lisinopril is on hold. Potassium levels improved this morning.  History of PE on Coumadin Coumadin per pharmacy. INR 1.99.  Chronic anemia Follow CBC.  Chronic Systolic CHF Ejection fraction is 20-25% based on echocardiogram from February. Fluid management per nephrology.  DVT Prophylaxis: On warfarin    Code Status: Full code  Family Communication: Discussed with the patient  Disposition Plan: Continue current management. Anticipate discharge tomorrow if his symptoms improve.    LOS: 1 day   [facility] Pager [phone] 10/17/2015, 1:21 PM  If  7PM-7AM, please contact night-coverage at [email or web address], password [facility]

## 2015-10-17 NOTE — Progress Notes (Signed)
Subjective: Interval History: has no complaint, discussed going to his own unit.  Objective: Vital signs in last 24 hours: Temp:  [97.6 F (36.4 C)-97.9 F (36.6 C)] 97.6 F (36.4 C) (03/28 0649) Pulse Rate:  [60-98] 85 (03/28 0950) Resp:  [9-29] 15 (03/28 0950) BP: (121-193)/(95-131) 178/103 mmHg (03/28 0950) SpO2:  [92 %-100 %] 100 % (03/28 0545) Weight:  [79.3 kg (174 lb 13.2 oz)-85.1 kg (187 lb 9.8 oz)] 85.1 kg (187 lb 9.8 oz) (03/28 0649) Weight change:   Intake/Output from previous day:   Intake/Output this shift:    General appearance: alert, no distress and pale Resp: rales bibasilar Chest wall: RIJ cath Cardio: S1, S2 normal, systolic murmur: holosystolic 2/6, blowing at apex and LV lift GI: liver down 7 cm tender Extremities: edema 1+  Lab Results:  Recent Labs  10/16/15 1556 10/17/15 0556  WBC 6.5 7.6  HGB 8.7* 9.0*  HCT 28.0* 28.6*  PLT 187 204   BMET:  Recent Labs  10/16/15 1556 10/17/15 0556  NA 137 138  K 6.9* 5.4*  CL 102 101  CO2 20* 20*  GLUCOSE 108* 81  BUN 84* 89*  CREATININE 15.37* 16.09*  CALCIUM 8.7* 8.7*   No results for input(s): PTH in the last 72 hours. Iron Studies: No results for input(s): IRON, TIBC, TRANSFERRIN, FERRITIN in the last 72 hours.  Studies/Results: Dg Chest 2 View  10/16/2015  CLINICAL DATA:  Chest pain with shortness of breath. Dialysis 3 days ago. Recent hospitalization. EXAM: CHEST  2 VIEW COMPARISON:  10/06/2015 and 09/23/2015. FINDINGS: 1546 hours. Right IJ dialysis catheters are unchanged at the SVC right atrial level. The mediastinal contours and cardiomegaly appear unchanged. There is mild chronic vascular congestion without overt pulmonary edema, confluent airspace opacity or significant pleural effusion. The bones appear unchanged. IMPRESSION: Cardiomegaly with chronic vascular congestion. No edema or other definite acute findings. Electronically Signed   By: Richardean Sale M.D.   On: 10/16/2015 15:58     I have reviewed the patient's current medications.  Assessment/Plan: 1 ESRd vol xs, HTN , uremia, ^ k . Nonadherence 2 NONADHERENCE primary issue 3 HTN 4 Anemia 5 HPTH not addressed P HD, vol decrease,     LOS: 1 day   Adel Neyer L 10/17/2015,10:14 AM

## 2015-10-17 NOTE — Progress Notes (Signed)
Patient arrived back from hemodialysis. Patient refusing troponin lab draw. Explained to patient why MD ordered lab and patient still refusing. Dr. [name] paged to make aware.

## 2015-10-17 NOTE — Discharge Summary (Signed)
[facility]  Physician Discharge Summary   Patient ID: Frank [name] MRN: [id number] DOB/AGE: 01-25-1966 50 y.o.  Admit date: 10/16/2015 Discharge date: 10/17/2015  PCP: [name] Patient  DISCHARGE DIAGNOSES:  Principal Problem:   Chest pain Active Problems:   ESRD on dialysis- Tues, Thurs, Sat in [employer], nephro: [name]   Hyperkalemia   Chronic pulmonary embolism (HCC)   Hypertensive urgency   Pain in the chest   PATIENT LEFT AMA  RECOMMENDATIONS FOR OUTPATIENT FOLLOW UP: Patient left AMA  INITIAL HISTORY: 50 year old African-American male with a past medical history of end-stage renal disease on dialysis, but is noncompliant with this treatment, history of PE on Coumadin, presented with complaints of chest pain. Apparently, his last dialysis was Friday. Patient's blood pressure was noted to be markedly elevated. He was noted to have hyperkalemia. He was hospitalized for further management.  Consultations:  Nephrology  Procedures:  Hemodialysis  HOSPITAL COURSE:   Chest pain Chest pain appeared to be atypical to the admitting M.D. CT scan was recommended. However, patient has declined. No mediastinal widening noted on chest x-ray. His elevated blood pressure and the fact that he has not had dialysis in many days could've contributed. Troponins are noted to be mildly elevated, but these are chronic for this patient. Reassess his symptoms after dialysis and with blood pressure control. EKG did show ST depression and T inversion in lead V6. Could have been due to electrolyte abnormalities and elevated blood pressure.  Hypertensive urgency Continue home medications. When necessary IV hydralazine for now. Patient is likely noncompliant with his medication regimen.  ESRD on hemodialysis with hyperkalemia Dialyzed this morning. Patient received Kayexalate for hyperkalemia. Lisinopril is on hold. Potassium levels improved this morning.  History of PE on  Coumadin Coumadin per pharmacy. INR 1.99.  Chronic anemia Follow CBC.  Chronic Systolic CHF Ejection fraction is 20-25% based on echocardiogram from February. Fluid management per nephrology.  Patient wanted to go home. He was still with elevated BP. He was told that it would be preferable to be monitored for one more day. However he did not agree and decided to leave AMA.   PERTINENT LABS:  The results of significant diagnostics from this hospitalization (including imaging, microbiology, ancillary and laboratory) are listed below for reference.    Microbiology: Recent Results (from the past 240 hour(s))  MRSA PCR Screening     Status: None   Collection Time: 10/16/15  9:52 PM  Result Value Ref Range Status   MRSA by PCR NEGATIVE NEGATIVE Final    Comment:        The GeneXpert MRSA Assay (FDA approved for NASAL specimens only), is one component of a comprehensive MRSA colonization surveillance program. It is not intended to diagnose MRSA infection nor to guide or monitor treatment for MRSA infections.      Labs: Basic Metabolic Panel:  Recent Labs Lab 10/16/15 1556 10/17/15 0556  NA 137 138  K 6.9* 5.4*  CL 102 101  CO2 20* 20*  GLUCOSE 108* 81  BUN 84* 89*  CREATININE 15.37* 16.09*  CALCIUM 8.7* 8.7*   CBC:  Recent Labs Lab 10/16/15 1556 10/17/15 0556  WBC 6.5 7.6  HGB 8.7* 9.0*  HCT 28.0* 28.6*  MCV 90.9 89.7  PLT 187 204   Cardiac Enzymes:  Recent Labs Lab 10/16/15 2231 10/17/15 0556  TROPONINI 0.07* 0.07*   BNP: BNP (last 3 results)  Recent Labs  01/26/15 0421 07/17/15 2135 09/24/15 0324  BNP >  4500.0* >4500.0* >4500.0*    IMAGING STUDIES Dg Chest 2 View  10/16/2015  CLINICAL DATA:  Chest pain with shortness of breath. Dialysis 3 days ago. Recent hospitalization. EXAM: CHEST  2 VIEW COMPARISON:  10/06/2015 and 09/23/2015. FINDINGS: 1546 hours. Right IJ dialysis catheters are unchanged at the SVC right atrial level. The mediastinal  contours and cardiomegaly appear unchanged. There is mild chronic vascular congestion without overt pulmonary edema, confluent airspace opacity or significant pleural effusion. The bones appear unchanged. IMPRESSION: Cardiomegaly with chronic vascular congestion. No edema or other definite acute findings. Electronically Signed   By: Richardean Sale M.D.   On: 10/16/2015 15:58   Dg Chest 2 View  10/06/2015  CLINICAL DATA:  Acute onset of mid back pain and generalized weakness. Shortness of breath, nausea and vomiting. Initial encounter. EXAM: CHEST  2 VIEW COMPARISON:  Chest radiograph performed 09/23/2015 FINDINGS: The lungs are well-aerated. Vascular congestion is noted. Mildly increased interstitial markings could reflect minimal interstitial edema. There is no evidence of pleural effusion or pneumothorax. The heart is enlarged. A right-sided dual-lumen catheter is noted ending about the cavoatrial junction. No acute osseous abnormalities are seen. IMPRESSION: Vascular congestion and cardiomegaly. Mildly increased interstitial markings could reflect minimal interstitial edema. Electronically Signed   By: Garald Balding M.D.   On: 10/06/2015 03:04   Dg Chest 2 View  09/23/2015  CLINICAL DATA:  Chest pain and shortness of breath, dialysis patient EXAM: CHEST  2 VIEW COMPARISON:  09/21/2015 FINDINGS: Dialysis catheter unchanged with tip into the right atrium. Severe cardiac silhouette enlargement. Central vascular congestion similar to prior study. No pulmonary edema or pleural effusion. IMPRESSION: Cardiomegaly with central vascular congestion similar to prior studies with no acute findings Electronically Signed   By: Skipper Cliche M.D.   On: 09/23/2015 07:55   Dg Chest 2 View  09/21/2015  CLINICAL DATA:  Shortness of breath. EXAM: CHEST  2 VIEW COMPARISON:  September 19, 2015. FINDINGS: Stable cardiomegaly. Right internal jugular dialysis catheter is unchanged with distal tip in expected position of right  atrium. No pneumothorax or pleural effusion is noted. No acute pulmonary disease is noted. Mild central pulmonary vascular congestion is noted. Bony thorax is unremarkable. IMPRESSION: Stable cardiomegaly with central pulmonary vascular congestion. Electronically Signed   By: Marijo Conception, M.D.   On: 09/21/2015 07:21   Dg Chest 2 View  09/19/2015  CLINICAL DATA:  Acute onset of shortness of breath, nausea and vomiting. Mid chest pain and cough. Initial encounter. EXAM: CHEST  2 VIEW COMPARISON:  Chest radiograph from 09/16/2015 FINDINGS: Vascular congestion is noted. Mildly increased interstitial markings raise concern for mild interstitial edema. No pleural effusion or pneumothorax is seen. The cardiomediastinal silhouette is enlarged. No acute osseous abnormalities are identified. A right-sided dual-lumen catheter is noted ending within the right atrium. IMPRESSION: Vascular congestion and cardiomegaly. Mildly increased interstitial markings raise concern for mild interstitial edema. Electronically Signed   By: Garald Balding M.D.   On: 09/19/2015 22:58   Dg Chest Port 1v Same Day  09/23/2015  CLINICAL DATA:  Encounter for central line placement EXAM: PORTABLE CHEST 1 VIEW COMPARISON:  09/23/2015 FINDINGS: There is a right chest wall dialysis catheter with tips in the cavoatrial junction. No pneumothorax identified. Cardiac enlargement and pulmonary vascular congestion noted. IMPRESSION: 1. No complication after right chest wall dialysis catheter placement with tips in the cavoatrial junction. Electronically Signed   By: Kerby Moors M.D.   On: 09/23/2015 18:46   Dg  Fluoro Guide Cv Line-no Report  09/23/2015  CLINICAL DATA:  FLOURO GUIDE CV LINE Fluoroscopy was utilized by the requesting physician.  No radiographic interpretation.    ALLERGIES:  Allergies  Allergen Reactions  . Butalbital-Apap-Caffeine Shortness Of Breath and Swelling    Swelling in throat  . Ferrlecit [Na Ferric Gluc Cplx In  Sucrose] Shortness Of Breath, Swelling and Other (See Comments)    Swelling in throat  . Minoxidil Shortness Of Breath  . Darvocet [Propoxyphene N-Acetaminophen] Hives    Patient left AMA and so DC meds could not be reconciled.   [facility] Pager [phone]  10/17/2015, 3:21 PM

## 2015-10-17 NOTE — Progress Notes (Addendum)
ANTICOAGULATION CONSULT NOTE - Follow Up Consult  Pharmacy Consult for Coumadin Indication: Hx PE  Allergies  Allergen Reactions  . Butalbital-Apap-Caffeine Shortness Of Breath and Swelling    Swelling in throat  . Ferrlecit [Na Ferric Gluc Cplx In Sucrose] Shortness Of Breath, Swelling and Other (See Comments)    Swelling in throat  . Minoxidil Shortness Of Breath  . Darvocet [Propoxyphene N-Acetaminophen] Hives  . Propoxyphene Hives    Allergy listed on file from [facility] (Pt did not mention any allergies)     Patient Measurements: Weight: 179 lb 7.3 oz (81.4 kg) Heparin Dosing Weight:   Vital Signs: Temp: 97.9 F (36.6 C) (03/28 1209) Temp Source: Oral (03/28 1120) BP: 181/95 mmHg (03/28 1209) Pulse Rate: 92 (03/28 1209)  Labs:  Recent Labs  10/16/15 1556 10/16/15 2231 10/17/15 0556  HGB 8.7*  --  9.0*  HCT 28.0*  --  28.6*  PLT 187  --  204  LABPROT  --  21.9* 22.5*  INR  --  1.92* 1.99*  CREATININE 15.37*  --  16.09*  TROPONINI  --  0.07* 0.07*    Estimated Creatinine Clearance: 6.3 mL/min (by C-G formula based on Cr of 16.09).   Medications:  Scheduled:  . amLODipine  10 mg Oral Daily  . aspirin EC  81 mg Oral Daily  . atorvastatin  40 mg Oral q1800  . carvedilol  6.25 mg Oral BID WC  . cinacalcet  30 mg Oral Q breakfast  . docusate sodium  100 mg Oral BID  . doxercalciferol  2.5 mcg Intravenous Q T,Th,Sa-HD  . gabapentin  100 mg Oral TID  . hydrALAZINE  10 mg Intravenous Once  . hydrALAZINE  100 mg Oral 3 times per day  . isosorbide mononitrate  90 mg Oral Daily  . multivitamin  1 tablet Oral QHS  . pantoprazole  40 mg Oral Daily  . predniSONE  5 mg Oral Q breakfast  . sevelamer carbonate  1,600 mg Oral TID WC  . Warfarin - Pharmacist Dosing Inpatient   Does not apply q1800    Assessment: 50yo male with ESRD, non-compliant with HD, on Coumadin for history of PE.  Home dose of Coumadin is 64m daily.  INR 1.99 this AM after 140mdose ~1AM  as pt mildly below goal.  His last dose of Coumadin pta was 3/26.  Hg 9, pltc wnl.  No bleeding noted.  Goal of Therapy:  INR 2-3 Monitor platelets by anticoagulation protocol: Yes   Plan:  Coumadin 65m21monight Daily INR Watch for s/s of bleeding  [facility]

## 2015-10-17 NOTE — Progress Notes (Addendum)
Patient refusing medications and assessment. Patient stating he wants needs to leave now. Patient agitated while attempting to explain why he needs to stay. MD contacted and Dr. [name] stated that patient is not ready to discharge home and told to patient. Patient made aware that he is leaving against medical advice and AMA paperwork reviewed. Patient stated he is aware and agreed to sign AMA paper.

## 2015-10-17 NOTE — Procedures (Signed)
I was present at this session.  I have reviewed the session itself and made appropriate changes.  Going for 4-5 L.  Cath ok.  ??why no hep. Clotting with 1 h to go.  Isma Tietje L 3/28/201710:13 AM

## 2015-10-19 ENCOUNTER — Emergency Department (HOSPITAL_COMMUNITY)
Admission: EM | Admit: 2015-10-19 | Discharge: 2015-10-19 | Disposition: A | Discharge status: Home / Self Care | Payer: Medicare Other | Attending: Emergency Medicine | Admitting: Emergency Medicine

## 2015-10-19 ENCOUNTER — Emergency Department (HOSPITAL_COMMUNITY): Payer: Medicare Other

## 2015-10-19 ENCOUNTER — Encounter (HOSPITAL_COMMUNITY): Payer: Self-pay | Admitting: Emergency Medicine

## 2015-10-19 DIAGNOSIS — Z992 Dependence on renal dialysis: Secondary | ICD-10-CM | POA: Insufficient documentation

## 2015-10-19 DIAGNOSIS — N2581 Secondary hyperparathyroidism of renal origin: Secondary | ICD-10-CM | POA: Diagnosis not present

## 2015-10-19 DIAGNOSIS — I120 Hypertensive chronic kidney disease with stage 5 chronic kidney disease or end stage renal disease: Secondary | ICD-10-CM | POA: Diagnosis not present

## 2015-10-19 DIAGNOSIS — E119 Type 2 diabetes mellitus without complications: Secondary | ICD-10-CM | POA: Diagnosis not present

## 2015-10-19 DIAGNOSIS — I159 Secondary hypertension, unspecified: Secondary | ICD-10-CM

## 2015-10-19 DIAGNOSIS — D689 Coagulation defect, unspecified: Secondary | ICD-10-CM | POA: Diagnosis not present

## 2015-10-19 DIAGNOSIS — Z7952 Long term (current) use of systemic steroids: Secondary | ICD-10-CM | POA: Insufficient documentation

## 2015-10-19 DIAGNOSIS — R1084 Generalized abdominal pain: Secondary | ICD-10-CM | POA: Diagnosis not present

## 2015-10-19 DIAGNOSIS — Z87891 Personal history of nicotine dependence: Secondary | ICD-10-CM | POA: Insufficient documentation

## 2015-10-19 DIAGNOSIS — I509 Heart failure, unspecified: Secondary | ICD-10-CM | POA: Insufficient documentation

## 2015-10-19 DIAGNOSIS — Z9889 Other specified postprocedural states: Secondary | ICD-10-CM | POA: Insufficient documentation

## 2015-10-19 DIAGNOSIS — F329 Major depressive disorder, single episode, unspecified: Secondary | ICD-10-CM | POA: Insufficient documentation

## 2015-10-19 DIAGNOSIS — D631 Anemia in chronic kidney disease: Secondary | ICD-10-CM | POA: Diagnosis not present

## 2015-10-19 DIAGNOSIS — Z418 Encounter for other procedures for purposes other than remedying health state: Secondary | ICD-10-CM | POA: Diagnosis not present

## 2015-10-19 DIAGNOSIS — F419 Anxiety disorder, unspecified: Secondary | ICD-10-CM | POA: Diagnosis not present

## 2015-10-19 DIAGNOSIS — N186 End stage renal disease: Secondary | ICD-10-CM | POA: Diagnosis not present

## 2015-10-19 DIAGNOSIS — Z008 Encounter for other general examination: Secondary | ICD-10-CM | POA: Diagnosis present

## 2015-10-19 DIAGNOSIS — Z79899 Other long term (current) drug therapy: Secondary | ICD-10-CM | POA: Diagnosis not present

## 2015-10-19 DIAGNOSIS — M549 Dorsalgia, unspecified: Secondary | ICD-10-CM | POA: Diagnosis not present

## 2015-10-19 DIAGNOSIS — Z7901 Long term (current) use of anticoagulants: Secondary | ICD-10-CM | POA: Diagnosis not present

## 2015-10-19 DIAGNOSIS — Z862 Personal history of diseases of the blood and blood-forming organs and certain disorders involving the immune mechanism: Secondary | ICD-10-CM | POA: Diagnosis not present

## 2015-10-19 LAB — COMPREHENSIVE METABOLIC PANEL
ALBUMIN: 2.9 g/dL — AB (ref 3.5–5.0)
ALT: 19 U/L (ref 17–63)
ANION GAP: 12 (ref 5–15)
AST: 70 U/L — ABNORMAL HIGH (ref 15–41)
Alkaline Phosphatase: 123 U/L (ref 38–126)
BUN: 38 mg/dL — ABNORMAL HIGH (ref 6–20)
CHLORIDE: 101 mmol/L (ref 101–111)
CO2: 24 mmol/L (ref 22–32)
CREATININE: 8.41 mg/dL — AB (ref 0.61–1.24)
Calcium: 8 mg/dL — ABNORMAL LOW (ref 8.9–10.3)
GFR calc non Af Amer: 7 mL/min — ABNORMAL LOW (ref >60)
GFR, EST AFRICAN AMERICAN: 8 mL/min — AB (ref >60)
GLUCOSE: 160 mg/dL — AB (ref 65–99)
Potassium: 4.3 mmol/L (ref 3.5–5.1)
SODIUM: 137 mmol/L (ref 135–145)
Total Bilirubin: 0.8 mg/dL (ref 0.3–1.2)
Total Protein: 7.3 g/dL (ref 6.5–8.1)

## 2015-10-19 LAB — CBC WITH DIFFERENTIAL/PLATELET
Basophils Absolute: 0 10*3/uL (ref 0.0–0.1)
Basophils Relative: 0 %
Eosinophils Absolute: 0.6 10*3/uL (ref 0.0–0.7)
Eosinophils Relative: 10 %
HEMATOCRIT: 29.3 % — AB (ref 39.0–52.0)
HEMOGLOBIN: 9.2 g/dL — AB (ref 13.0–17.0)
LYMPHS ABS: 1.1 10*3/uL (ref 0.7–4.0)
Lymphocytes Relative: 21 %
MCH: 28.3 pg (ref 26.0–34.0)
MCHC: 31.4 g/dL (ref 30.0–36.0)
MCV: 90.2 fL (ref 78.0–100.0)
MONOS PCT: 9 %
Monocytes Absolute: 0.5 10*3/uL (ref 0.1–1.0)
NEUTROS ABS: 3.3 10*3/uL (ref 1.7–7.7)
NEUTROS PCT: 60 %
Platelets: 183 10*3/uL (ref 150–400)
RBC: 3.25 MIL/uL — ABNORMAL LOW (ref 4.22–5.81)
RDW: 17.8 % — ABNORMAL HIGH (ref 11.5–15.5)
WBC: 5.5 10*3/uL (ref 4.0–10.5)

## 2015-10-19 LAB — PROTIME-INR
INR: 2.72 — ABNORMAL HIGH (ref 0.00–1.49)
Prothrombin Time: 28.5 seconds — ABNORMAL HIGH (ref 11.6–15.2)

## 2015-10-19 LAB — APTT: aPTT: 48 seconds — ABNORMAL HIGH (ref 24–37)

## 2015-10-19 MED ORDER — METHOCARBAMOL 500 MG PO TABS
500.0000 mg | ORAL_TABLET | Freq: Once | ORAL | Status: AC
  Administered 2015-10-19: 500 mg via ORAL
  Filled 2015-10-19: qty 1

## 2015-10-19 MED ORDER — HYDRALAZINE HCL 25 MG PO TABS
100.0000 mg | ORAL_TABLET | Freq: Once | ORAL | Status: AC
  Administered 2015-10-19: 100 mg via ORAL
  Filled 2015-10-19: qty 4

## 2015-10-19 MED ORDER — HYDROCODONE-ACETAMINOPHEN 5-325 MG PO TABS
1.0000 | ORAL_TABLET | Freq: Once | ORAL | Status: AC
Start: 2015-10-19 — End: 2015-10-19
  Administered 2015-10-19: 1 via ORAL
  Filled 2015-10-19: qty 1

## 2015-10-19 MED ORDER — CARVEDILOL 12.5 MG PO TABS
6.2500 mg | ORAL_TABLET | Freq: Two times a day (BID) | ORAL | Status: DC
  Administered 2015-10-19: 6.25 mg via ORAL
  Filled 2015-10-19: qty 1

## 2015-10-19 NOTE — ED Notes (Signed)
Pt. is in [employer] custody , states medical clearance needed prior to imprisonment due to his hypertension  / missed his hemodialysis today , denies pain /respirations unlabored .

## 2015-10-19 NOTE — ED Notes (Signed)
Changed dialysis port dressing per request of officer using sterile technique.  Tolerated well.  Discharged to police custody at this time.

## 2015-10-19 NOTE — Discharge Instructions (Signed)
CXR clear, no edema. Potassium normal. He appears well, without dyspnea and no c/o SOB. He does not appear to be in emergent need of dialysis and is stable from this standpoint.   Improving BP from admission in a patient with chronically elevated blood pressure, no cardiovascular complaints currently, unchanged EKG. He is considered stable for outpatient treatment of blood pressure with regular medications.  Cogulopathy - therapeutic on Coumadin. No change indicated.

## 2015-10-19 NOTE — ED Notes (Signed)
Reports no change in pain.  Noted to turn on stretcher without grimacing.  Eating at this time.  Encouraged to call for assistance as needed.

## 2015-10-19 NOTE — ED Provider Notes (Signed)
CSN: [id number]     Arrival date & time 10/19/15  1922 History   First MD Initiated Contact with Patient 10/19/15 2059     Chief Complaint  Patient presents with  . Medical Clearance     (Consider location/radiation/quality/duration/timing/severity/associated sxs/prior Treatment) HPI Comments: Patient with complicated medical history including ESRD-HD (Tues, Thurs, Fri in [place], Dr. Donnetta Simpers), HTN, CHF, DM, and depression presents in police custody for medical management prior to incarceration. Per police history, the patient was being processed into the jail when the jail nurse refused to clear him secondary to elevated blood pressure and that he had missed his dialysis today. The patient reports he was in [place] at the funeral of his brother and did not make it back in time for his dialysis treatment. He reports taking his morning medications but no other medications today. His last received dialysis was 4 days ago while hositalized. He currently complains of back pain (chronic).   The history is provided by the patient and the police. No language interpreter was used.    Past Medical History  Diagnosis Date  . Hypertension   . Depression   . Complication of anesthesia     itching, sore throat  . Shortness of breath   . Anxiety   . CHF (congestive heart failure) ([place])   . Anemia   . Dialysis patient ([place])   . Type II diabetes mellitus (HCC)     No history per patient, but remains under history as A1c would not be accurate given on dialysis  . ESRD (end stage renal disease) ([place])     due to HTN per patient, followed at [facility], s/p failed kidney transplant - dialysis Tue, Th, Sat  . Renal insufficiency    Past Surgical History  Procedure Laterality Date  . Kidney receipient  2006    failed and started HD in March 2014  . Capd insertion    . Capd removal    . Left heart catheterization with coronary angiogram N/A 09/02/2014    Procedure: LEFT HEART CATHETERIZATION WITH  CORONARY ANGIOGRAM;  Surgeon: [name], MD;  Location: [facility] CATH LAB;  Service: Cardiovascular;  Laterality: N/A;  . Inguinal hernia repair Right 02/14/2015    Procedure: REPAIR INCARCERATED RIGHT INGUINAL HERNIA;  Surgeon: [name], MD;  Location: [place];  Service: General;  Laterality: Right;  . Insertion of dialysis catheter Right 09/23/2015    Procedure: exchange of Right internal Dialysis Catheter.;  Surgeon: [name], MD;  Location[facility] OR;  Service: Vascular;  Laterality: Right;   Family History  Problem Relation Age of Onset  . Hypertension Other    Social History  Substance Use Topics  . Smoking status: Former Smoker -- 0.00 packs/day for 1 years    Types: Cigarettes  . Smokeless tobacco: Never Used     Comment: quit Jan 2014  . Alcohol Use: No    Review of Systems  Constitutional: Negative for fever and chills.  HENT: Negative.   Respiratory: Negative.   Cardiovascular: Negative.   Gastrointestinal: Negative.   Musculoskeletal: Positive for back pain.  Skin: Negative.   Neurological: Negative.       Allergies  Butalbital-apap-caffeine; Ferrlecit; Minoxidil; and Darvocet  Home Medications   Prior to Admission medications   Medication Sig Start Date End Date Taking? Authorizing Provider  ALPRAZolam [name]) 0.5 MG tablet Take 1 tablet (0.5 mg total) by mouth 2 (two) times daily as needed for anxiety. 09/11/15  Yes Prashant K  Candiss Norse, MD  amLODipine (NORVASC) 10 MG tablet Take 10 mg by mouth daily.   Yes Historical Provider, MD  atorvastatin (LIPITOR) 40 MG tablet Take 1 tablet (40 mg total) by mouth daily at 6 PM. 09/02/14  Yes Barton Dubois, MD  carvedilol (COREG) 6.25 MG tablet Take 1 tablet (6.25 mg total) by mouth 2 (two) times daily with a meal. 09/27/15  Yes [name], MD  cinacalcet (SENSIPAR) 30 MG tablet Take 30 mg by mouth daily.   Yes Historical Provider, MD  docusate sodium (COLACE) 100 MG capsule Take 1 capsule (100 mg total) by mouth 2  (two) times daily. 11/19/14  Yes [name], MD  hydrALAZINE (APRESOLINE) 50 MG tablet Take 2 tablets (100 mg total) by mouth every 8 (eight) hours. 09/11/15  Yes [name], MD  hydrocerin (EUCERIN) CREA Apply 1 application topically 2 (two) times daily. 02/04/15  Yes [name], MD  HYDROcodone-acetaminophen (NORCO/VICODIN) 5-325 MG tablet Take 1 tablet by mouth every 6 (six) hours as needed. Patient taking differently: Take 1 tablet by mouth every 6 (six) hours as needed for moderate pain.  09/27/15  Yes [name], MD  hydrOXYzine (ATARAX/VISTARIL) 10 MG tablet Take 1 tablet (10 mg total) by mouth 3 (three) times daily as needed for itching. 09/27/15  Yes [name], MD  ibuprofen (ADVIL,MOTRIN) 600 MG tablet Take 1 tablet (600 mg total) by mouth every 8 (eight) hours as needed. 10/06/15  Yes [name], MD  isosorbide mononitrate (IMDUR) 30 MG 24 hr tablet Take 3 tablets (90 mg total) by mouth daily. 09/11/15  Yes [name], MD  levalbuterol [facility] HFA) 45 MCG/ACT inhaler Inhale 2 puffs into the lungs every 6 (six) hours as needed for wheezing or shortness of breath. 11/19/14  Yes [name], MD  lisinopril (PRINIVIL,ZESTRIL) 20 MG tablet Take 1 tablet (20 mg total) by mouth daily. 02/04/15  Yes [name], MD  methocarbamol (ROBAXIN) 500 MG tablet Take 1 tablet (500 mg total) by mouth every 8 (eight) hours as needed for muscle spasms. 10/06/15  Yes [name], MD  multivitamin (RENA-VIT) TABS tablet Take 1 tablet by mouth at bedtime. 02/04/15  Yes [name], MD  omeprazole (PRILOSEC) 20 MG capsule Take 20 mg by mouth daily. 07/01/13  Yes Historical Provider, MD  predniSONE (DELTASONE) 5 MG tablet Take 5 mg by mouth daily with breakfast.   Yes Historical Provider, MD  sevelamer carbonate (RENVELA) 800 MG tablet Take 800-1,600 mg by mouth 3 (three) times daily with meals. 2 tabs three times daily with meals, and 1 tablet with snacks   Yes Historical  Provider, MD  warfarin (COUMADIN) 6 MG tablet Take 1.5 tablets (9 mg total) by mouth daily at 6 PM. Take daily until adjusted per MD 09/12/15  Yes [name], MD  doxercalciferol (HECTOROL) 4 MCG/2ML injection Inject 1.25 mLs (2.5 mcg total) into the vein Every Tuesday,Thursday,and Saturday with dialysis. 10/10/14   [name], DO  gabapentin (NEURONTIN) 100 MG capsule Take 100 mg by mouth 3 (three) times daily. 06/14/15 10/06/15  Historical Provider, MD   BP 178/120 mmHg  Pulse 88  Temp(Src) 98.4 F (36.9 C) (Oral)  Resp 22  SpO2 100% Physical Exam  Constitutional: He is oriented to person, place, and time. He appears well-developed and well-nourished. No distress.  HENT:  Head: Normocephalic.  Mouth/Throat: Oropharynx is clear and moist.  Eyes: Conjunctivae are normal.  Neck: Normal range  of motion. Neck supple.  Cardiovascular: Normal rate and regular rhythm.   Pulmonary/Chest: Effort normal and breath sounds normal. He has no wheezes. He has no rales.  Abdominal: Soft. Bowel sounds are normal. There is tenderness. There is no rebound and no guarding.  Generalized abdominal tenderness.   Musculoskeletal: Normal range of motion. He exhibits no edema.  Neurological: He is alert and oriented to person, place, and time. Coordination normal.  Skin: Skin is warm and dry. He is not diaphoretic.  Dry scaling skin generally, c/w history of same.  Psychiatric: He has a normal mood and affect.    ED Course  Procedures (including critical care time) Labs Review Labs Reviewed  COMPREHENSIVE METABOLIC PANEL  CBC WITH DIFFERENTIAL/PLATELET  PROTIME-INR  APTT   Results for orders placed or performed during the hospital encounter of 10/19/15  Comprehensive metabolic panel  Result Value Ref Range   Sodium 137 135 - 145 mmol/L   Potassium 4.3 3.5 - 5.1 mmol/L   Chloride 101 101 - 111 mmol/L   CO2 24 22 - 32 mmol/L   Glucose, Bld 160 (H) 65 - 99 mg/dL   BUN 38 (H) 6 - 20 mg/dL    Creatinine, Ser 8.41 (H) 0.61 - 1.24 mg/dL   Calcium 8.0 (L) 8.9 - 10.3 mg/dL   Total Protein 7.3 6.5 - 8.1 g/dL   Albumin 2.9 (L) 3.5 - 5.0 g/dL   AST 70 (H) 15 - 41 U/L   ALT 19 17 - 63 U/L   Alkaline Phosphatase 123 38 - 126 U/L   Total Bilirubin 0.8 0.3 - 1.2 mg/dL   GFR calc non Af Amer 7 (L) >60 mL/min   GFR calc Af Amer 8 (L) >60 mL/min   Anion gap 12 5 - 15  CBC with Differential  Result Value Ref Range   WBC 5.5 4.0 - 10.5 K/uL   RBC 3.25 (L) 4.22 - 5.81 MIL/uL   Hemoglobin 9.2 (L) 13.0 - 17.0 g/dL   HCT 29.3 (L) 39.0 - 52.0 %   MCV 90.2 78.0 - 100.0 fL   MCH 28.3 26.0 - 34.0 pg   MCHC 31.4 30.0 - 36.0 g/dL   RDW 17.8 (H) 11.5 - 15.5 %   Platelets 183 150 - 400 K/uL   Neutrophils Relative % 60 %   Neutro Abs 3.3 1.7 - 7.7 K/uL   Lymphocytes Relative 21 %   Lymphs Abs 1.1 0.7 - 4.0 K/uL   Monocytes Relative 9 %   Monocytes Absolute 0.5 0.1 - 1.0 K/uL   Eosinophils Relative 10 %   Eosinophils Absolute 0.6 0.0 - 0.7 K/uL   Basophils Relative 0 %   Basophils Absolute 0.0 0.0 - 0.1 K/uL  Protime-INR  Result Value Ref Range   Prothrombin Time 28.5 (H) 11.6 - 15.2 seconds   INR 2.72 (H) 0.00 - 1.49  APTT  Result Value Ref Range   aPTT 48 (H) 24 - 37 seconds    Imaging Review No results found. I have personally reviewed and evaluated these images and lab results as part of my medical decision-making.   EKG Interpretation None      MDM   Final diagnoses:  None    1. Hypertension 2. Coagulopathy 3. ESRD - HD, stable  Patient here for medical clearance to be held in jail. H/o ESRD on HD after failed transplant. H/o HTN. Recent hospitalization for chest pain, left AMA to attend family funeral this week. He missed HD today, as  well as second doses of regular medications. The nurse at the jail was concerned about elevated blood pressure and that he missed dialysis.   CXR clear, no edema. Potassium normal. He appears well, without dyspnea and no c/o SOB. He  does not appear to be in emergent need of dialysis and is stable from this standpoint.   Blood pressure - second doses of medications provided in ED. Will recheck blood pressure in one hour for improvement.  10:45 - Improving BP from admission in a patient with chronic elevated blood pressure, no cardiovascular complaints currently, unchanged EKG. He is considered stable for outpatient treatment of blood pressure with regular medications.  Cogulopathy - therapeutic on Coumadin. No change indicated.   He is felt stable for discharge and safe for incarceration.   Charlann Lange, PA-C 10/19/15 2308  Sharlett Iles, MD 10/20/15 Laureen Abrahams

## 2015-10-20 ENCOUNTER — Encounter (HOSPITAL_COMMUNITY): Payer: Self-pay | Admitting: Family Medicine

## 2015-10-20 ENCOUNTER — Emergency Department (HOSPITAL_COMMUNITY)
Admission: EM | Admit: 2015-10-20 | Discharge: 2015-10-20 | Disposition: A | Discharge status: Home / Self Care | Payer: Medicare Other | Attending: Emergency Medicine | Admitting: Emergency Medicine

## 2015-10-20 DIAGNOSIS — Z79899 Other long term (current) drug therapy: Secondary | ICD-10-CM | POA: Insufficient documentation

## 2015-10-20 DIAGNOSIS — F419 Anxiety disorder, unspecified: Secondary | ICD-10-CM | POA: Diagnosis not present

## 2015-10-20 DIAGNOSIS — Z9889 Other specified postprocedural states: Secondary | ICD-10-CM | POA: Diagnosis not present

## 2015-10-20 DIAGNOSIS — Z992 Dependence on renal dialysis: Secondary | ICD-10-CM | POA: Insufficient documentation

## 2015-10-20 DIAGNOSIS — F329 Major depressive disorder, single episode, unspecified: Secondary | ICD-10-CM | POA: Diagnosis not present

## 2015-10-20 DIAGNOSIS — I509 Heart failure, unspecified: Secondary | ICD-10-CM | POA: Diagnosis not present

## 2015-10-20 DIAGNOSIS — G8929 Other chronic pain: Secondary | ICD-10-CM | POA: Insufficient documentation

## 2015-10-20 DIAGNOSIS — Z7952 Long term (current) use of systemic steroids: Secondary | ICD-10-CM | POA: Insufficient documentation

## 2015-10-20 DIAGNOSIS — Z87891 Personal history of nicotine dependence: Secondary | ICD-10-CM | POA: Diagnosis not present

## 2015-10-20 DIAGNOSIS — I160 Hypertensive urgency: Secondary | ICD-10-CM | POA: Insufficient documentation

## 2015-10-20 DIAGNOSIS — Z7901 Long term (current) use of anticoagulants: Secondary | ICD-10-CM | POA: Insufficient documentation

## 2015-10-20 DIAGNOSIS — E119 Type 2 diabetes mellitus without complications: Secondary | ICD-10-CM | POA: Insufficient documentation

## 2015-10-20 DIAGNOSIS — M549 Dorsalgia, unspecified: Secondary | ICD-10-CM | POA: Insufficient documentation

## 2015-10-20 DIAGNOSIS — F17210 Nicotine dependence, cigarettes, uncomplicated: Secondary | ICD-10-CM | POA: Diagnosis not present

## 2015-10-20 DIAGNOSIS — N186 End stage renal disease: Secondary | ICD-10-CM | POA: Diagnosis not present

## 2015-10-20 DIAGNOSIS — Z862 Personal history of diseases of the blood and blood-forming organs and certain disorders involving the immune mechanism: Secondary | ICD-10-CM | POA: Insufficient documentation

## 2015-10-20 DIAGNOSIS — Z008 Encounter for other general examination: Secondary | ICD-10-CM | POA: Diagnosis present

## 2015-10-20 LAB — I-STAT CHEM 8, ED
BUN: 51 mg/dL — AB (ref 6–20)
CREATININE: 9.8 mg/dL — AB (ref 0.61–1.24)
Calcium, Ion: 0.88 mmol/L — ABNORMAL LOW (ref 1.12–1.23)
Chloride: 104 mmol/L (ref 101–111)
GLUCOSE: 97 mg/dL (ref 65–99)
HEMATOCRIT: 39 % (ref 39.0–52.0)
HEMOGLOBIN: 13.3 g/dL (ref 13.0–17.0)
POTASSIUM: 4.9 mmol/L (ref 3.5–5.1)
Sodium: 138 mmol/L (ref 135–145)
TCO2: 20 mmol/L (ref 0–100)

## 2015-10-20 NOTE — Discharge Instructions (Signed)
Your blood pressure has been consistently elevated during each of your ER visit.  Please make sure you go to your dialysis appointment tomorrow.  Follow up with your doctor for further care.

## 2015-10-20 NOTE — ED Notes (Signed)
Pt here from jail and needs clearance to be released and sts he will get his dialysis tomorrow.

## 2015-10-20 NOTE — ED Provider Notes (Signed)
CSN: [id number]     Arrival date & time 10/20/15  1635 History  By signing my name below, I, Frank [name], attest that this documentation has been prepared under the direction and in the presence of [name], PA-C. Electronically Signed: [name], ED Scribe. 10/20/2015. 5:39 PM.    Chief Complaint  Patient presents with  . Medical Clearance   The history is provided by the patient. No language interpreter was used.    HPI Comments: BENJAMYN [name] is a 50 y.o. male with a PMHx including ESRD-HD (Tues, Thurs, Sat in [place]; Dr. Donnetta Simpers), HTN, CHF, DM, and depression who presents in police custody presents to the Emergency Department for medical clearance due to his BP (199/136 mmHg in triage) prior to being released. Pt confirms being seen yesterday, had labs obtained which appeared to be at his baseline with nml potassium and a CXR showing no signs of fluid overload. He confirms his next dialysis appointment is tomorrow. Pt endorses a PMHx of chronic back pain but denies any acute worsening of his symptoms. He denies fever, headache, vision changes, focal numbness/weakness, SOB, abd pain, CP or any other associated symptoms at this time.   Past Medical History  Diagnosis Date  . Hypertension   . Depression   . Complication of anesthesia     itching, sore throat  . Shortness of breath   . Anxiety   . CHF (congestive heart failure) ([name])   . Anemia   . Dialysis patient ([name])   . Type II diabetes mellitus (HCC)     No history per patient, but remains under history as A1c would not be accurate given on dialysis  . ESRD (end stage renal disease) ([name])     due to HTN per patient, followed at [facility], s/p failed kidney transplant - dialysis Tue, Th, Sat  . Renal insufficiency    Past Surgical History  Procedure Laterality Date  . Kidney receipient  2006    failed and started HD in March 2014  . Capd insertion    . Capd removal    . Left heart catheterization with coronary  angiogram N/A 09/02/2014    Procedure: LEFT HEART CATHETERIZATION WITH CORONARY ANGIOGRAM;  Surgeon: [name], MD;  Location[facility] CATH LAB;  Service: Cardiovascular;  Laterality: N/A;  . Inguinal hernia repair Right 02/14/2015    Procedure: REPAIR INCARCERATED RIGHT INGUINAL HERNIA;  Surgeon: [name], MD;  Location: [place];  Service: General;  Laterality: Right;  . Insertion of dialysis catheter Right 09/23/2015    Procedure: exchange of Right internal Dialysis Catheter.;  Surgeon: [name], MD;  Location: [facility] OR;  Service: Vascular;  Laterality: Right;   Family History  Problem Relation Age of Onset  . Hypertension Other    Social History  Substance Use Topics  . Smoking status: Former Smoker -- 0.00 packs/day for 1 years    Types: Cigarettes  . Smokeless tobacco: Never Used     Comment: quit Jan 2014  . Alcohol Use: No    Review of Systems  Respiratory: Negative for shortness of breath.   Cardiovascular: Negative for chest pain.  Gastrointestinal: Negative for abdominal pain.  Musculoskeletal: Positive for back pain (chronic).  All other systems reviewed and are negative.   Allergies  Butalbital-apap-caffeine; Ferrlecit; Minoxidil; and Darvocet  Home Medications   Prior to Admission medications   Medication Sig Start Date End Date Taking? Authorizing Provider  ALPRAZolam [name]) 0.5 MG tablet Take 1 tablet (  0.5 mg total) by mouth 2 (two) times daily as needed for anxiety. 09/11/15   [name], MD  amLODipine (NORVASC) 10 MG tablet Take 10 mg by mouth daily.    Historical Provider, MD  atorvastatin (LIPITOR) 40 MG tablet Take 1 tablet (40 mg total) by mouth daily at 6 PM. 09/02/14   Barton Dubois, MD  carvedilol (COREG) 6.25 MG tablet Take 1 tablet (6.25 mg total) by mouth 2 (two) times daily with a meal. 09/27/15   [name], MD  cinacalcet (SENSIPAR) 30 MG tablet Take 30 mg by mouth daily.    Historical Provider, MD  docusate sodium (COLACE) 100 MG  capsule Take 1 capsule (100 mg total) by mouth 2 (two) times daily. 11/19/14   [name], MD  doxercalciferol (HECTOROL) 4 MCG/2ML injection Inject 1.25 mLs (2.5 mcg total) into the vein Every Tuesday,Thursday,and Saturday with dialysis. 10/10/14   [name], DO  gabapentin (NEURONTIN) 100 MG capsule Take 100 mg by mouth 3 (three) times daily. 06/14/15 10/06/15  Historical Provider, MD  hydrALAZINE (APRESOLINE) 50 MG tablet Take 2 tablets (100 mg total) by mouth every 8 (eight) hours. 09/11/15   [name], MD  hydrocerin (EUCERIN) CREA Apply 1 application topically 2 (two) times daily. 02/04/15   [name], MD  HYDROcodone-acetaminophen (NORCO/VICODIN) 5-325 MG tablet Take 1 tablet by mouth every 6 (six) hours as needed. Patient taking differently: Take 1 tablet by mouth every 6 (six) hours as needed for moderate pain.  09/27/15   [name], MD  hydrOXYzine (ATARAX/VISTARIL) 10 MG tablet Take 1 tablet (10 mg total) by mouth 3 (three) times daily as needed for itching. 09/27/15   [name], MD  ibuprofen (ADVIL,MOTRIN) 600 MG tablet Take 1 tablet (600 mg total) by mouth every 8 (eight) hours as needed. 10/06/15   [name], MD  isosorbide mononitrate (IMDUR) 30 MG 24 hr tablet Take 3 tablets (90 mg total) by mouth daily. 09/11/15   [name], MD  levalbuterol [facility] HFA) 45 MCG/ACT inhaler Inhale 2 puffs into the lungs every 6 (six) hours as needed for wheezing or shortness of breath. 11/19/14   [name], MD  lisinopril (PRINIVIL,ZESTRIL) 20 MG tablet Take 1 tablet (20 mg total) by mouth daily. 02/04/15   [name], MD  methocarbamol (ROBAXIN) 500 MG tablet Take 1 tablet (500 mg total) by mouth every 8 (eight) hours as needed for muscle spasms. 10/06/15   [name], MD  multivitamin (RENA-VIT) TABS tablet Take 1 tablet by mouth at bedtime. 02/04/15   [name], MD  omeprazole (PRILOSEC) 20 MG capsule Take 20 mg by mouth daily. 07/01/13   Historical  Provider, MD  predniSONE (DELTASONE) 5 MG tablet Take 5 mg by mouth daily with breakfast.    Historical Provider, MD  sevelamer carbonate (RENVELA) 800 MG tablet Take 800-1,600 mg by mouth 3 (three) times daily with meals. 2 tabs three times daily with meals, and 1 tablet with snacks    Historical Provider, MD  warfarin (COUMADIN) 6 MG tablet Take 1.5 tablets (9 mg total) by mouth daily at 6 PM. Take daily until adjusted per MD 09/12/15   [name], MD   BP 199/136 mmHg  Pulse 93  Temp(Src) 97.8 F (36.6 C) (Oral)  Ht _0  (1.88 m)  Wt 165 lb (74.844 kg)  BMI 21.18 kg/m2  SpO2 97% Physical Exam  Constitutional: He is oriented to person,  place, and time. He appears well-developed and well-nourished.  HENT:  Head: Normocephalic and atraumatic.  Eyes: EOM are normal.  Neck: Normal range of motion.  Cardiovascular: Normal rate, regular rhythm and normal heart sounds.  Exam reveals no gallop and no friction rub.   No murmur heard. RUE: AV fistula with nml bruit and thrill  Pulmonary/Chest: Effort normal and breath sounds normal. No respiratory distress. He has no wheezes. He has no rales.  No evidence of pleural effusion  Abdominal: Soft.  Musculoskeletal: Normal range of motion.  Neurological: He is alert and oriented to person, place, and time.  Skin: Skin is warm and dry.  Psychiatric: He has a normal mood and affect.  Nursing note and vitals reviewed.  ED Course  Procedures  DIAGNOSTIC STUDIES: Oxygen Saturation is 97% on RA, normal by my interpretation.    COORDINATION OF CARE: 5:36 PM Pt presents today for medical clearance. Discussed treatment plan with pt at bedside. Return precautions noted. Pt agreed to plan.  Labs Review Labs Reviewed  I-STAT CHEM 8, ED - Abnormal; Notable for the following:    BUN 51 (*)    Creatinine, Ser 9.80 (*)    Calcium, Ion 0.88 (*)    All other components within normal limits   Imaging Review Dg Chest 2 View  10/19/2015  CLINICAL  DATA:  HTN, ESRD, dialysis patient, Type II diabetes, CHF EXAM: CHEST  2 VIEW COMPARISON:  10/16/2015 FINDINGS: Mild to moderate enlargement of the cardiopericardial silhouette, stable. No mediastinal or hilar masses or evidence of adenopathy. Right internal jugular tunneled dual lumen central venous catheter is stable with its tip in the lower superior vena cava just above the caval atrial junction. Lungs are essentially clear. No evidence of pulmonary edema or pneumonia. No pleural effusion or pneumothorax. Skeletal structures are unremarkable. IMPRESSION: 1. No acute cardiopulmonary disease. Electronically Signed   By: Lajean Manes M.D.   On: 10/19/2015 21:37   I have personally reviewed and evaluated these images and lab results as part of my medical decision-making.   EKG Interpretation None      MDM   Final diagnoses:  Dialysis patient [facility])  Asymptomatic hypertensive urgency    BP 192/136 mmHg  Pulse 93  Temp(Src) 97.5 F (36.4 C) (Oral)  Resp 20  Ht _0  (1.88 m)  Wt 74.844 kg  BMI 21.18 kg/m2  SpO2 100%   I personally performed the services described in this documentation, which was scribed in my presence. The recorded information has been reviewed and is accurate.     5:51 PM This is a dialysis patient sent here from the jail house for evaluation of his hypertension before he's being release from the jail house today. He is currently asymptomatic. His recent chest x-ray from yesterday was unremarkable. Repeat i-STAT's today shows no evidence of hyperkalemia. I have reviewed his prior charts and he has been consistently hypertensive throughout each visit. He will need his dialysis tomorrow but otherwise he is medically cleared to return to his facility.  [name], PA-C 10/20/15 [place], MD 10/21/15 [phone]

## 2015-10-21 ENCOUNTER — Emergency Department (HOSPITAL_COMMUNITY)
Admission: EM | Admit: 2015-10-21 | Discharge: 2015-10-21 | Disposition: A | Discharge status: Home / Self Care | Payer: Medicare Other | Attending: Emergency Medicine | Admitting: Emergency Medicine

## 2015-10-21 ENCOUNTER — Encounter (HOSPITAL_COMMUNITY): Payer: Self-pay

## 2015-10-21 DIAGNOSIS — F419 Anxiety disorder, unspecified: Secondary | ICD-10-CM | POA: Insufficient documentation

## 2015-10-21 DIAGNOSIS — Z79899 Other long term (current) drug therapy: Secondary | ICD-10-CM | POA: Insufficient documentation

## 2015-10-21 DIAGNOSIS — E119 Type 2 diabetes mellitus without complications: Secondary | ICD-10-CM | POA: Insufficient documentation

## 2015-10-21 DIAGNOSIS — Z992 Dependence on renal dialysis: Secondary | ICD-10-CM | POA: Insufficient documentation

## 2015-10-21 DIAGNOSIS — Z87891 Personal history of nicotine dependence: Secondary | ICD-10-CM | POA: Diagnosis not present

## 2015-10-21 DIAGNOSIS — I120 Hypertensive chronic kidney disease with stage 5 chronic kidney disease or end stage renal disease: Secondary | ICD-10-CM | POA: Insufficient documentation

## 2015-10-21 DIAGNOSIS — D631 Anemia in chronic kidney disease: Secondary | ICD-10-CM | POA: Diagnosis not present

## 2015-10-21 DIAGNOSIS — D649 Anemia, unspecified: Secondary | ICD-10-CM

## 2015-10-21 DIAGNOSIS — N185 Chronic kidney disease, stage 5: Secondary | ICD-10-CM | POA: Diagnosis not present

## 2015-10-21 DIAGNOSIS — D539 Nutritional anemia, unspecified: Secondary | ICD-10-CM | POA: Diagnosis not present

## 2015-10-21 DIAGNOSIS — Z7901 Long term (current) use of anticoagulants: Secondary | ICD-10-CM | POA: Diagnosis not present

## 2015-10-21 DIAGNOSIS — N186 End stage renal disease: Secondary | ICD-10-CM | POA: Insufficient documentation

## 2015-10-21 LAB — BASIC METABOLIC PANEL
Anion gap: 12 (ref 5–15)
BUN: 59 mg/dL — AB (ref 6–20)
CO2: 22 mmol/L (ref 22–32)
Calcium: 8.3 mg/dL — ABNORMAL LOW (ref 8.9–10.3)
Chloride: 104 mmol/L (ref 101–111)
Creatinine, Ser: 10.52 mg/dL — ABNORMAL HIGH (ref 0.61–1.24)
GFR, EST AFRICAN AMERICAN: 6 mL/min — AB (ref >60)
GFR, EST NON AFRICAN AMERICAN: 5 mL/min — AB (ref >60)
Glucose, Bld: 142 mg/dL — ABNORMAL HIGH (ref 65–99)
POTASSIUM: 4.9 mmol/L (ref 3.5–5.1)
SODIUM: 138 mmol/L (ref 135–145)

## 2015-10-21 LAB — CBC
HCT: 26.6 % — ABNORMAL LOW (ref 39.0–52.0)
Hemoglobin: 8.5 g/dL — ABNORMAL LOW (ref 13.0–17.0)
MCH: 28.9 pg (ref 26.0–34.0)
MCHC: 32 g/dL (ref 30.0–36.0)
MCV: 90.5 fL (ref 78.0–100.0)
PLATELETS: 159 10*3/uL (ref 150–400)
RBC: 2.94 MIL/uL — ABNORMAL LOW (ref 4.22–5.81)
RDW: 17.5 % — AB (ref 11.5–15.5)
WBC: 4.1 10*3/uL (ref 4.0–10.5)

## 2015-10-21 NOTE — ED Notes (Addendum)
Spoke with [facility] who reports that pt is able to come to center today for dialysis.  Tomi Bamberger MD made aware

## 2015-10-21 NOTE — ED Provider Notes (Signed)
CSN: [id number]     Arrival date & time 10/21/15  0715 History   First MD Initiated Contact with Patient 10/21/15 [phone]     Chief complaint: Routine dialysis HPI The patient has history of chronic renal failure.  He normally goes to an Outpatient Dialysis Ctr., Tuesday Thursday Saturday in [place]. The patient's currently incarcerated. Last evening he came to the emergency room because of hypertension. Patient was evaluated and was released after it was terminated there is no acute emergent issue and he was not in need of any emergent dialysis. When the patient was discharged to his instructed to follow-up with dialysis today. Patient is scheduled to be seen at his outpatient Center in [facility] today. The patient thought he was supposed to come back to the emergency room for dialysis so the jail [facility] him here to the emergency department.  Patient denies any trouble chest pain or shortness of breath. No fevers or chills.  Past Medical History  Diagnosis Date  . Hypertension   . Depression   . Complication of anesthesia     itching, sore throat  . Shortness of breath   . Anxiety   . CHF (congestive heart failure) ([place])   . Anemia   . Dialysis patient ([place])   . Type II diabetes mellitus (HCC)     No history per patient, but remains under history as A1c would not be accurate given on dialysis  . ESRD (end stage renal disease) ([place])     due to HTN per patient, followed at [facility], s/p failed kidney transplant - dialysis Tue, Th, Sat  . Renal insufficiency    Past Surgical History  Procedure Laterality Date  . Kidney receipient  2006    failed and started HD in March 2014  . Capd insertion    . Capd removal    . Left heart catheterization with coronary angiogram N/A 09/02/2014    Procedure: LEFT HEART CATHETERIZATION WITH CORONARY ANGIOGRAM;  Surgeon: [name], MD;  Location: [facility] CATH LAB;  Service: Cardiovascular;  Laterality: N/A;  . Inguinal hernia repair Right 02/14/2015    Procedure: REPAIR INCARCERATED RIGHT INGUINAL HERNIA;  Surgeon: [name], MD;  Location: [place];  Service: General;  Laterality: Right;  . Insertion of dialysis catheter Right 09/23/2015    Procedure: exchange of Right internal Dialysis Catheter.;  Surgeon: [name], MD;  Location: [facility] OR;  Service: Vascular;  Laterality: Right;   Family History  Problem Relation Age of Onset  . Hypertension Other    Social History  Substance Use Topics  . Smoking status: Former Smoker -- 0.00 packs/day for 1 years    Types: Cigarettes  . Smokeless tobacco: Never Used     Comment: quit Jan 2014  . Alcohol Use: No    Review of Systems  All other systems reviewed and are negative.     Allergies  Butalbital-apap-caffeine; Ferrlecit; Minoxidil; and Darvocet  Home Medications   Prior to Admission medications   Medication Sig Start Date End Date Taking? Authorizing Provider  ALPRAZolam [name]) 0.5 MG tablet Take 1 tablet (0.5 mg total) by mouth 2 (two) times daily as needed for anxiety. 09/11/15   [name], MD  amLODipine (NORVASC) 10 MG tablet Take 10 mg by mouth daily.    Historical Provider, MD  atorvastatin (LIPITOR) 40 MG tablet Take 1 tablet (40 mg total) by mouth daily at 6 PM. 09/02/14   Barton Dubois, MD  carvedilol (COREG) 6.25 MG  tablet Take 1 tablet (6.25 mg total) by mouth 2 (two) times daily with a meal. 09/27/15   [name], MD  cinacalcet (SENSIPAR) 30 MG tablet Take 30 mg by mouth daily.    Historical Provider, MD  docusate sodium (COLACE) 100 MG capsule Take 1 capsule (100 mg total) by mouth 2 (two) times daily. 11/19/14   [name], MD  doxercalciferol (HECTOROL) 4 MCG/2ML injection Inject 1.25 mLs (2.5 mcg total) into the vein Every Tuesday,Thursday,and Saturday with dialysis. 10/10/14   [name], DO  gabapentin (NEURONTIN) 100 MG capsule Take 100 mg by mouth 3 (three) times daily. 06/14/15 10/06/15  Historical Provider, MD  hydrALAZINE (APRESOLINE) 50  MG tablet Take 2 tablets (100 mg total) by mouth every 8 (eight) hours. 09/11/15   [name], MD  hydrocerin (EUCERIN) CREA Apply 1 application topically 2 (two) times daily. 02/04/15   [name], MD  HYDROcodone-acetaminophen (NORCO/VICODIN) 5-325 MG tablet Take 1 tablet by mouth every 6 (six) hours as needed. Patient taking differently: Take 1 tablet by mouth every 6 (six) hours as needed for moderate pain.  09/27/15   [name], MD  hydrOXYzine (ATARAX/VISTARIL) 10 MG tablet Take 1 tablet (10 mg total) by mouth 3 (three) times daily as needed for itching. 09/27/15   [name], MD  ibuprofen (ADVIL,MOTRIN) 600 MG tablet Take 1 tablet (600 mg total) by mouth every 8 (eight) hours as needed. 10/06/15   [name], MD  isosorbide mononitrate (IMDUR) 30 MG 24 hr tablet Take 3 tablets (90 mg total) by mouth daily. 09/11/15   [name], MD  levalbuterol [name] HFA) 45 MCG/ACT inhaler Inhale 2 puffs into the lungs every 6 (six) hours as needed for wheezing or shortness of breath. 11/19/14   [name], MD  lisinopril (PRINIVIL,ZESTRIL) 20 MG tablet Take 1 tablet (20 mg total) by mouth daily. 02/04/15   [name], MD  methocarbamol (ROBAXIN) 500 MG tablet Take 1 tablet (500 mg total) by mouth every 8 (eight) hours as needed for muscle spasms. 10/06/15   [name], MD  multivitamin (RENA-VIT) TABS tablet Take 1 tablet by mouth at bedtime. 02/04/15   [name], MD  omeprazole (PRILOSEC) 20 MG capsule Take 20 mg by mouth daily. 07/01/13   Historical Provider, MD  predniSONE (DELTASONE) 5 MG tablet Take 5 mg by mouth daily with breakfast.    Historical Provider, MD  sevelamer carbonate (RENVELA) 800 MG tablet Take 800-1,600 mg by mouth 3 (three) times daily with meals. 2 tabs three times daily with meals, and 1 tablet with snacks    Historical Provider, MD  warfarin (COUMADIN) 6 MG tablet Take 1.5 tablets (9 mg total) by mouth daily at 6 PM. Take daily until adjusted  per MD 09/12/15   [name], MD   BP 122/83 mmHg  Pulse 79  Temp(Src) 97.5 F (36.4 C) (Oral)  Resp 16  SpO2 100% Physical Exam  Constitutional: No distress.  HENT:  Head: Normocephalic and atraumatic.  Right Ear: External ear normal.  Left Ear: External ear normal.  Eyes: Conjunctivae are normal. Right eye exhibits no discharge. Left eye exhibits no discharge. No scleral icterus.  Neck: Neck supple. No tracheal deviation present.  Cardiovascular: Normal rate, regular rhythm and intact distal pulses.   Pulmonary/Chest: Effort normal and breath sounds normal. No stridor. No respiratory distress. He has no wheezes. He has no rales.  Abdominal: Soft. Bowel sounds are  normal. He exhibits no distension. There is no tenderness. There is no rebound and no guarding.  Musculoskeletal: He exhibits no edema or tenderness.  Neurological: He is alert. He has normal strength. No cranial nerve deficit (no facial droop, extraocular movements intact, no slurred speech) or sensory deficit. He exhibits normal muscle tone. He displays no seizure activity. Coordination normal.  Skin: Skin is warm and dry. No rash noted. He is not diaphoretic.  Psychiatric: He has a normal mood and affect.  Nursing note and vitals reviewed.   ED Course  Procedures (including critical care time) Labs Review Labs Reviewed  BASIC METABOLIC PANEL - Abnormal; Notable for the following:    Glucose, Bld 142 (*)    BUN 59 (*)    Creatinine, Ser 10.52 (*)    Calcium 8.3 (*)    GFR calc non Af Amer 5 (*)    GFR calc Af Amer 6 (*)    All other components within normal limits  CBC - Abnormal; Notable for the following:    RBC 2.94 (*)    Hemoglobin 8.5 (*)    HCT 26.6 (*)    RDW 17.5 (*)    All other components within normal limits    Imaging Review Dg Chest 2 View  10/19/2015  CLINICAL DATA:  HTN, ESRD, dialysis patient, Type II diabetes, CHF EXAM: CHEST  2 VIEW COMPARISON:  10/16/2015 FINDINGS: Mild to moderate  enlargement of the cardiopericardial silhouette, stable. No mediastinal or hilar masses or evidence of adenopathy. Right internal jugular tunneled dual lumen central venous catheter is stable with its tip in the lower superior vena cava just above the caval atrial junction. Lungs are essentially clear. No evidence of pulmonary edema or pneumonia. No pleural effusion or pneumothorax. Skeletal structures are unremarkable. IMPRESSION: 1. No acute cardiopulmonary disease. Electronically Signed   By: Lajean Manes M.D.   On: 10/19/2015 21:37   I have personally reviewed and evaluated these images and lab results as part of my medical decision-making.   MDM   Final diagnoses:  Chronic renal failure, stage 5 (HCC)    I Review the patient's discharge instructions. He was told to follow-up with dialysis today but he was not instructed to come back to the emergency department. I spoke with Dr. Joseph Berkshire  on-call for nephrology and he confirmed that there are no specific plans to have the patient dialyzed today here in the hospital  Patient's laboratory tests today did not show any acute emergent abnormalities. He has a stable anemia.  He has chronic renal failure without hyperkalemia.  We contacted the patient's dialysis center. He can go there today to have his dialysis as planned.  Dorie Rank, MD 10/21/15 [phone]

## 2015-10-21 NOTE — ED Notes (Signed)
Pt seen in ED 10/20/14 to be cleared for dialysis.  Pt is back today to receive dialysis.  Pt last had dialysis on Monday.  Pt denies any symptoms at this time.

## 2015-10-21 NOTE — Discharge Instructions (Signed)
Dialysis Dialysis is a procedure that replaces some of the work healthy kidneys do. It is done when you lose about 85-90% of your kidney function. It may also be done earlier if your symptoms may be improved by dialysis. During dialysis, wastes, salt, and extra water are removed from the blood, and the levels of certain chemicals in the blood (such as potassium) are maintained. Dialysis is done in sessions. Dialysis sessions are continued until the kidneys get better. If the kidneys cannot get better, such as in end-stage kidney disease, dialysis is continued for life or until you receive a new kidney (kidney transplant). There are two types of dialysis: hemodialysis and peritoneal dialysis. WHAT IS HEMODIALYSIS?  Hemodialysis is a type of dialysis in which a machine called a dialyzer is used to filter the blood. Before beginning hemodialysis, you will have surgery to create a site where blood can be removed from the body and returned to the body (vascular access). There are three types of vascular accesses:  Arteriovenous fistula. To create this type of access, an artery is connected to a vein (usually in the arm). A fistula takes 1-6 months to develop after surgery. If it develops properly, it usually lasts longer than the other types of vascular accesses. It is also less likely to become infected and cause blood clots.  Arteriovenous graft. To create this type of access, an artery and a vein in the arm are connected with a tube. A graft may be used within 2-3 weeks of surgery.  A venous catheter. To create this type of access, a thin, flexible tube (catheter) is placed in a large vein in your neck, chest, or groin. A catheter may be used right away. It is usually used as a temporary access when dialysis needs to begin immediately. During hemodialysis, blood leaves the body through your access. It travels through a tube to the dialyzer, where it is filtered. The blood then returns to your body through  another tube. Hemodialysis is usually performed by a health care provider at a hospital or dialysis center three times a week. Visits last about 3-4 hours. It may also be performed with the help of another person at home with training.  WHAT IS PERITONEAL DIALYSIS? Peritoneal dialysis is a type of dialysis in which the thin lining of the abdomen (peritoneum) is used as a filter. Before beginning peritoneal dialysis, you will have surgery to place a catheter in your abdomen. The catheter will be used to transfer a fluid called dialysate to and from your abdomen. At the start of a session, your abdomen is filled with dialysate. During the session, wastes, salt, and extra water in the blood pass through the peritoneum and into the dialysate. The dialysate is drained from the body at the end of the session. The process of filling and draining the dialysate is called an exchange. Exchanges are repeated until you have used up all the dialysate for the day. Peritoneal dialysis may be performed by you at home or at almost any other location. It is done every day. You may need up to five exchanges a day. The amount of time the dialysate is in your body between exchanges is called a dwell. The dwell depends on the number of exchanges needed and the characteristics of the peritoneum. It usually varies from 1.5-3 hours. You may go about your day normally between exchanges. Alternately, the exchanges may be done at night while you sleep, using a machine called a cycler. WHICH TYPE  OF DIALYSIS SHOULD I CHOOSE?  Both hemodialysis and peritoneal dialysis have advantages and disadvantages. Talk to your health care provider about which type of dialysis would be best for you. Your lifestyle and preferences should be considered along with your medical condition. In some cases, only one type of dialysis may be an option.  Advantages of hemodialysis  It is done less often than peritoneal dialysis.  Someone else can do the  dialysis for you.  If you go to a dialysis center, your health care provider will be able to recognize any problems right away.  If you go to a dialysis center, you can interact with others who are having dialysis. This can provide you with emotional support. Disadvantages of hemodialysis  Hemodialysis may cause cramps and low blood pressure. It may leave you feeling tired on the days you have the treatment.  If you go to a dialysis center, you will need to make weekly appointments and work around the center's schedule.  You will need to take extra care when traveling. If you go to a dialysis center, you will need to make special arrangements to visit a dialysis center near your destination. If you are having treatments at home, you will need to take the dialyzer with you to your destination.  You will need to avoid more foods than you would need to avoid on peritoneal dialysis. Advantages of peritoneal dialysis  It is less likely than hemodialysis to cause cramps and low blood pressure.  You may do exchanges on your own wherever you are, including when you travel.  You do not need to avoid as many foods as you do on hemodialysis. Disadvantages of peritoneal dialysis  It is done more often than hemodialysis.  Performing peritoneal dialysis requires you to have dexterity of the hands. You must also be able to lift bags.  You will have to learn sterilization techniques. You will need to practice them every day to reduce the risk of infection. WHAT CHANGES WILL I NEED TO MAKE TO MY DIET DURING DIALYSIS? Both hemodialysis and peritoneal dialysis require you to make some changes to your diet. For example, you will need to limit your intake of foods high in the minerals phosphorus and potassium. You will also need to limit your fluid intake. Your dietitian can help you plan meals. A good meal plan can improve your dialysis and your health.  WHAT SHOULD I EXPECT WHEN BEGINNING  DIALYSIS? Adjusting to the dialysis treatment, schedule, and diet can take some time. You may need to stop working and may not be able to do some of the things you normally do. You may feel anxious or depressed when beginning dialysis. Eventually, many people feel better overall because of dialysis. Some people are able to return to work after making some changes, such as reducing work intensity. WHERE CAN I FIND MORE INFORMATION?   [place]: www.kidney.org  American Association of Kidney Patients: BombTimer.gl  [employer]: www.kidneyfund.org   This information is not intended to replace advice given to you by your health care provider. Make sure you discuss any questions you have with your health care provider.   Document Released: 09/28/2002 Document Revised: 07/29/2014 Document Reviewed: 09/01/2012 Elsevier Interactive Patient Education [employer].

## 2015-10-23 DIAGNOSIS — N2581 Secondary hyperparathyroidism of renal origin: Secondary | ICD-10-CM | POA: Diagnosis not present

## 2015-10-23 DIAGNOSIS — Z418 Encounter for other procedures for purposes other than remedying health state: Secondary | ICD-10-CM | POA: Diagnosis not present

## 2015-10-23 DIAGNOSIS — N186 End stage renal disease: Secondary | ICD-10-CM | POA: Diagnosis not present

## 2015-10-23 DIAGNOSIS — D631 Anemia in chronic kidney disease: Secondary | ICD-10-CM | POA: Diagnosis not present

## 2015-10-24 DIAGNOSIS — Z418 Encounter for other procedures for purposes other than remedying health state: Secondary | ICD-10-CM | POA: Diagnosis not present

## 2015-10-24 DIAGNOSIS — D631 Anemia in chronic kidney disease: Secondary | ICD-10-CM | POA: Diagnosis not present

## 2015-10-24 DIAGNOSIS — N186 End stage renal disease: Secondary | ICD-10-CM | POA: Diagnosis not present

## 2015-10-24 DIAGNOSIS — Z7901 Long term (current) use of anticoagulants: Secondary | ICD-10-CM | POA: Diagnosis not present

## 2015-10-24 DIAGNOSIS — N2581 Secondary hyperparathyroidism of renal origin: Secondary | ICD-10-CM | POA: Diagnosis not present

## 2015-10-25 DIAGNOSIS — Z79899 Other long term (current) drug therapy: Secondary | ICD-10-CM | POA: Diagnosis not present

## 2015-10-25 DIAGNOSIS — Z888 Allergy status to other drugs, medicaments and biological substances status: Secondary | ICD-10-CM | POA: Diagnosis not present

## 2015-10-25 DIAGNOSIS — Z940 Kidney transplant status: Secondary | ICD-10-CM | POA: Diagnosis not present

## 2015-10-25 DIAGNOSIS — Z992 Dependence on renal dialysis: Secondary | ICD-10-CM | POA: Diagnosis not present

## 2015-10-25 DIAGNOSIS — R1110 Vomiting, unspecified: Secondary | ICD-10-CM | POA: Diagnosis not present

## 2015-10-25 DIAGNOSIS — N186 End stage renal disease: Secondary | ICD-10-CM | POA: Diagnosis not present

## 2015-10-25 DIAGNOSIS — I120 Hypertensive chronic kidney disease with stage 5 chronic kidney disease or end stage renal disease: Secondary | ICD-10-CM | POA: Diagnosis not present

## 2015-10-25 DIAGNOSIS — Z7952 Long term (current) use of systemic steroids: Secondary | ICD-10-CM | POA: Diagnosis not present

## 2015-10-25 DIAGNOSIS — Z7901 Long term (current) use of anticoagulants: Secondary | ICD-10-CM | POA: Diagnosis not present

## 2015-10-25 DIAGNOSIS — R1031 Right lower quadrant pain: Secondary | ICD-10-CM | POA: Diagnosis not present

## 2015-10-26 DIAGNOSIS — N2889 Other specified disorders of kidney and ureter: Secondary | ICD-10-CM | POA: Diagnosis not present

## 2015-10-26 DIAGNOSIS — Z992 Dependence on renal dialysis: Secondary | ICD-10-CM | POA: Diagnosis not present

## 2015-10-26 DIAGNOSIS — E1139 Type 2 diabetes mellitus with other diabetic ophthalmic complication: Secondary | ICD-10-CM | POA: Diagnosis not present

## 2015-10-26 DIAGNOSIS — I120 Hypertensive chronic kidney disease with stage 5 chronic kidney disease or end stage renal disease: Secondary | ICD-10-CM | POA: Diagnosis not present

## 2015-10-26 DIAGNOSIS — Z418 Encounter for other procedures for purposes other than remedying health state: Secondary | ICD-10-CM | POA: Diagnosis not present

## 2015-10-26 DIAGNOSIS — N2581 Secondary hyperparathyroidism of renal origin: Secondary | ICD-10-CM | POA: Diagnosis not present

## 2015-10-26 DIAGNOSIS — Z940 Kidney transplant status: Secondary | ICD-10-CM | POA: Diagnosis not present

## 2015-10-26 DIAGNOSIS — D631 Anemia in chronic kidney disease: Secondary | ICD-10-CM | POA: Diagnosis not present

## 2015-10-26 DIAGNOSIS — R1031 Right lower quadrant pain: Secondary | ICD-10-CM | POA: Diagnosis not present

## 2015-10-26 DIAGNOSIS — Z7901 Long term (current) use of anticoagulants: Secondary | ICD-10-CM | POA: Diagnosis not present

## 2015-10-26 DIAGNOSIS — R112 Nausea with vomiting, unspecified: Secondary | ICD-10-CM | POA: Diagnosis not present

## 2015-10-26 DIAGNOSIS — R109 Unspecified abdominal pain: Secondary | ICD-10-CM | POA: Diagnosis not present

## 2015-10-26 DIAGNOSIS — N186 End stage renal disease: Secondary | ICD-10-CM | POA: Diagnosis not present

## 2015-10-28 DIAGNOSIS — D631 Anemia in chronic kidney disease: Secondary | ICD-10-CM | POA: Diagnosis not present

## 2015-10-28 DIAGNOSIS — Z418 Encounter for other procedures for purposes other than remedying health state: Secondary | ICD-10-CM | POA: Diagnosis not present

## 2015-10-28 DIAGNOSIS — N186 End stage renal disease: Secondary | ICD-10-CM | POA: Diagnosis not present

## 2015-10-28 DIAGNOSIS — N2581 Secondary hyperparathyroidism of renal origin: Secondary | ICD-10-CM | POA: Diagnosis not present

## 2015-10-29 ENCOUNTER — Emergency Department (HOSPITAL_COMMUNITY)
Admission: EM | Admit: 2015-10-29 | Discharge: 2015-10-29 | Disposition: A | Discharge status: Home / Self Care | Payer: Medicare Other | Source: Home / Self Care | Attending: Emergency Medicine | Admitting: Emergency Medicine

## 2015-10-29 ENCOUNTER — Encounter (HOSPITAL_COMMUNITY): Payer: Self-pay | Admitting: *Deleted

## 2015-10-29 ENCOUNTER — Emergency Department (HOSPITAL_COMMUNITY): Payer: Medicare Other

## 2015-10-29 DIAGNOSIS — M79605 Pain in left leg: Secondary | ICD-10-CM

## 2015-10-29 DIAGNOSIS — R197 Diarrhea, unspecified: Secondary | ICD-10-CM | POA: Insufficient documentation

## 2015-10-29 DIAGNOSIS — Z87891 Personal history of nicotine dependence: Secondary | ICD-10-CM

## 2015-10-29 DIAGNOSIS — Z7952 Long term (current) use of systemic steroids: Secondary | ICD-10-CM

## 2015-10-29 DIAGNOSIS — Z862 Personal history of diseases of the blood and blood-forming organs and certain disorders involving the immune mechanism: Secondary | ICD-10-CM

## 2015-10-29 DIAGNOSIS — F419 Anxiety disorder, unspecified: Secondary | ICD-10-CM

## 2015-10-29 DIAGNOSIS — Z992 Dependence on renal dialysis: Secondary | ICD-10-CM

## 2015-10-29 DIAGNOSIS — Z9889 Other specified postprocedural states: Secondary | ICD-10-CM | POA: Insufficient documentation

## 2015-10-29 DIAGNOSIS — I509 Heart failure, unspecified: Secondary | ICD-10-CM

## 2015-10-29 DIAGNOSIS — F329 Major depressive disorder, single episode, unspecified: Secondary | ICD-10-CM | POA: Insufficient documentation

## 2015-10-29 DIAGNOSIS — E119 Type 2 diabetes mellitus without complications: Secondary | ICD-10-CM

## 2015-10-29 DIAGNOSIS — M545 Low back pain, unspecified: Secondary | ICD-10-CM

## 2015-10-29 DIAGNOSIS — M79604 Pain in right leg: Secondary | ICD-10-CM | POA: Insufficient documentation

## 2015-10-29 DIAGNOSIS — R112 Nausea with vomiting, unspecified: Secondary | ICD-10-CM | POA: Insufficient documentation

## 2015-10-29 DIAGNOSIS — N186 End stage renal disease: Secondary | ICD-10-CM

## 2015-10-29 DIAGNOSIS — Z79899 Other long term (current) drug therapy: Secondary | ICD-10-CM

## 2015-10-29 DIAGNOSIS — Z7901 Long term (current) use of anticoagulants: Secondary | ICD-10-CM | POA: Insufficient documentation

## 2015-10-29 DIAGNOSIS — I120 Hypertensive chronic kidney disease with stage 5 chronic kidney disease or end stage renal disease: Secondary | ICD-10-CM | POA: Insufficient documentation

## 2015-10-29 LAB — COMPREHENSIVE METABOLIC PANEL
ALT: 14 U/L — ABNORMAL LOW (ref 17–63)
AST: 52 U/L — ABNORMAL HIGH (ref 15–41)
Albumin: 2.7 g/dL — ABNORMAL LOW (ref 3.5–5.0)
Alkaline Phosphatase: 112 U/L (ref 38–126)
Anion gap: 13 (ref 5–15)
BUN: 30 mg/dL — ABNORMAL HIGH (ref 6–20)
CHLORIDE: 102 mmol/L (ref 101–111)
CO2: 23 mmol/L (ref 22–32)
CREATININE: 7.85 mg/dL — AB (ref 0.61–1.24)
Calcium: 8.8 mg/dL — ABNORMAL LOW (ref 8.9–10.3)
GFR, EST AFRICAN AMERICAN: 8 mL/min — AB (ref >60)
GFR, EST NON AFRICAN AMERICAN: 7 mL/min — AB (ref >60)
Glucose, Bld: 96 mg/dL (ref 65–99)
POTASSIUM: 4.7 mmol/L (ref 3.5–5.1)
Sodium: 138 mmol/L (ref 135–145)
Total Bilirubin: 0.7 mg/dL (ref 0.3–1.2)
Total Protein: 7.9 g/dL (ref 6.5–8.1)

## 2015-10-29 LAB — CBC
HEMATOCRIT: 27.8 % — AB (ref 39.0–52.0)
Hemoglobin: 9.1 g/dL — ABNORMAL LOW (ref 13.0–17.0)
MCH: 29.4 pg (ref 26.0–34.0)
MCHC: 32.7 g/dL (ref 30.0–36.0)
MCV: 89.7 fL (ref 78.0–100.0)
PLATELETS: 151 10*3/uL (ref 150–400)
RBC: 3.1 MIL/uL — AB (ref 4.22–5.81)
RDW: 16.7 % — ABNORMAL HIGH (ref 11.5–15.5)
WBC: 5.4 10*3/uL (ref 4.0–10.5)

## 2015-10-29 LAB — LIPASE, BLOOD: LIPASE: 89 U/L — AB (ref 11–51)

## 2015-10-29 MED ORDER — PROCHLORPERAZINE EDISYLATE 5 MG/ML IJ SOLN
10.0000 mg | Freq: Once | INTRAMUSCULAR | Status: AC
  Administered 2015-10-29: 10 mg via INTRAMUSCULAR
  Filled 2015-10-29: qty 2

## 2015-10-29 MED ORDER — TRAMADOL HCL 50 MG PO TABS: 50.0000 mg | ORAL_TABLET | Freq: Four times a day (QID) | ORAL | Status: DC | PRN

## 2015-10-29 MED ORDER — HYDROMORPHONE HCL 1 MG/ML IJ SOLN
1.0000 mg | Freq: Once | INTRAMUSCULAR | Status: AC
  Administered 2015-10-29: 1 mg via INTRAMUSCULAR
  Filled 2015-10-29: qty 1

## 2015-10-29 MED ORDER — ONDANSETRON HCL 4 MG PO TABS: 4.0000 mg | ORAL_TABLET | Freq: Four times a day (QID) | ORAL | Status: DC

## 2015-10-29 MED ORDER — CYCLOBENZAPRINE HCL 10 MG PO TABS: 10.0000 mg | ORAL_TABLET | Freq: Three times a day (TID) | ORAL | Status: DC | PRN

## 2015-10-29 NOTE — ED Notes (Signed)
Pt left with all his belongings and ambulated out of the treatment area.

## 2015-10-29 NOTE — ED Provider Notes (Signed)
CSN: [id number]     Arrival date & time 10/29/15  0017 History  By signing my name below, I, Irene Pap, attest that this documentation has been prepared under the direction and in the presence of Orpah Greek, MD. Electronically Signed: Irene Pap, ED Scribe. 10/29/2015. 1:51 AM.  Chief Complaint  Patient presents with  . Abdominal Pain   The history is provided by the patient. No language interpreter was used.  HPI Comments: Frank [name] is a 50 y.o. Male with a hx of HTN, CHF, dialysis pt, Type II DM, ESRD and renal insufficiency who presents to the Emergency Department complaining of back pain onset 2 days ago. Pt reports associated bilateral leg pain, nausea, diarrhea, and vomiting. He reports worsening back pain with bending at the waist and worsening leg pain with walking. Pt states that he last had dialysis today. He has not taken anything for his pain. He denies abdominal pain.   Past Medical History  Diagnosis Date  . Hypertension   . Depression   . Complication of anesthesia     itching, sore throat  . Shortness of breath   . Anxiety   . CHF (congestive heart failure) ([place])   . Anemia   . Dialysis patient ([place])   . Type II diabetes mellitus ([facility])     No history per patient, but remains under history as A1c would not be accurate given on dialysis  . ESRD (end stage renal disease) ([place])     due to HTN per patient, followed at [facility], s/p failed kidney transplant - dialysis Tue, Th, Sat  . Renal insufficiency    Past Surgical History  Procedure Laterality Date  . Kidney receipient  2006    failed and started HD in March 2014  . Capd insertion    . Capd removal    . Left heart catheterization with coronary angiogram N/A 09/02/2014    Procedure: LEFT HEART CATHETERIZATION WITH CORONARY ANGIOGRAM;  Surgeon: [name], MD;  Location: [facility] CATH [facility];  Service: Cardiovascular;  Laterality: N/A;  . Inguinal hernia repair Right 02/14/2015    Procedure: REPAIR  INCARCERATED RIGHT INGUINAL HERNIA;  Surgeon: [name], MD;  Location: [place];  Service: General;  Laterality: Right;  . Insertion of dialysis catheter Right 09/23/2015    Procedure: exchange of Right internal Dialysis Catheter.;  Surgeon: [name], MD;  Location[facility] OR;  Service: Vascular;  Laterality: Right;   Family History  Problem Relation Age of Onset  . Hypertension Other    Social History  Substance Use Topics  . Smoking status: Former Smoker -- 0.00 packs/day for 1 years    Types: Cigarettes  . Smokeless tobacco: Never Used     Comment: quit Jan 2014  . Alcohol Use: No    Review of Systems  Gastrointestinal: Positive for nausea and vomiting. Negative for abdominal pain.  Musculoskeletal: Positive for back pain and arthralgias (bilateral legs).  All other systems reviewed and are negative.   Allergies  Butalbital-apap-caffeine; Ferrlecit; Minoxidil; and Darvocet  Home Medications   Prior to Admission medications   Medication Sig Start Date End Date Taking? Authorizing Provider  ALPRAZolam [name]) 0.5 MG tablet Take 1 tablet (0.5 mg total) by mouth 2 (two) times daily as needed for anxiety. 09/11/15   [name], MD  amLODipine (NORVASC) 10 MG tablet Take 10 mg by mouth daily.    Historical Provider, MD  atorvastatin (LIPITOR) 40 MG tablet Take 1 tablet (40  mg total) by mouth daily at 6 PM. 09/02/14   Barton Dubois, MD  carvedilol (COREG) 6.25 MG tablet Take 1 tablet (6.25 mg total) by mouth 2 (two) times daily with a meal. 09/27/15   [name], MD  cinacalcet (SENSIPAR) 30 MG tablet Take 30 mg by mouth daily.    Historical Provider, MD  docusate sodium (COLACE) 100 MG capsule Take 1 capsule (100 mg total) by mouth 2 (two) times daily. 11/19/14   [name], MD  doxercalciferol (HECTOROL) 4 MCG/2ML injection Inject 1.25 mLs (2.5 mcg total) into the vein Every Tuesday,Thursday,and Saturday with dialysis. 10/10/14   [name], DO  gabapentin  (NEURONTIN) 100 MG capsule Take 100 mg by mouth 3 (three) times daily. 06/14/15 10/06/15  Historical Provider, MD  hydrALAZINE (APRESOLINE) 50 MG tablet Take 2 tablets (100 mg total) by mouth every 8 (eight) hours. 09/11/15   [name], MD  hydrocerin (EUCERIN) CREA Apply 1 application topically 2 (two) times daily. 02/04/15   [name], MD  HYDROcodone-acetaminophen (NORCO/VICODIN) 5-325 MG tablet Take 1 tablet by mouth every 6 (six) hours as needed. Patient taking differently: Take 1 tablet by mouth every 6 (six) hours as needed for moderate pain.  09/27/15   [name], MD  hydrOXYzine (ATARAX/VISTARIL) 10 MG tablet Take 1 tablet (10 mg total) by mouth 3 (three) times daily as needed for itching. 09/27/15   [name], MD  ibuprofen (ADVIL,MOTRIN) 600 MG tablet Take 1 tablet (600 mg total) by mouth every 8 (eight) hours as needed. 10/06/15   [name], MD  isosorbide mononitrate (IMDUR) 30 MG 24 hr tablet Take 3 tablets (90 mg total) by mouth daily. 09/11/15   [name], MD  levalbuterol [facility] HFA) 45 MCG/ACT inhaler Inhale 2 puffs into the lungs every 6 (six) hours as needed for wheezing or shortness of breath. 11/19/14   [name], MD  lisinopril (PRINIVIL,ZESTRIL) 20 MG tablet Take 1 tablet (20 mg total) by mouth daily. 02/04/15   [name], MD  methocarbamol (ROBAXIN) 500 MG tablet Take 1 tablet (500 mg total) by mouth every 8 (eight) hours as needed for muscle spasms. 10/06/15   [name], MD  multivitamin (RENA-VIT) TABS tablet Take 1 tablet by mouth at bedtime. 02/04/15   [name], MD  omeprazole (PRILOSEC) 20 MG capsule Take 20 mg by mouth daily. 07/01/13   Historical Provider, MD  predniSONE (DELTASONE) 5 MG tablet Take 5 mg by mouth daily with breakfast.    Historical Provider, MD  sevelamer carbonate (RENVELA) 800 MG tablet Take 800-1,600 mg by mouth 3 (three) times daily with meals. 2 tabs three times daily with meals, and 1 tablet with  snacks    Historical Provider, MD  warfarin (COUMADIN) 6 MG tablet Take 1.5 tablets (9 mg total) by mouth daily at 6 PM. Take daily until adjusted per MD 09/12/15   [name], MD   BP 156/106 mmHg  Pulse 91  Temp(Src) 99 F (37.2 C) (Oral)  Resp 18  Ht _0  (1.88 m)  Wt 163 lb (73.936 kg)  BMI 20.92 kg/m2  SpO2 100% Physical Exam  Constitutional: He is oriented to person, place, and time. He appears well-developed and well-nourished. No distress.  HENT:  Head: Normocephalic and atraumatic.  Right Ear: Hearing normal.  Left Ear: Hearing normal.  Nose: Nose normal.  Mouth/Throat: Oropharynx is clear and moist and mucous membranes are normal.  Eyes:  Conjunctivae and EOM are normal. Pupils are equal, round, and reactive to light.  Neck: Normal range of motion. Neck supple.  Cardiovascular: Regular rhythm, S1 normal and S2 normal.  Exam reveals no gallop and no friction rub.   No murmur heard. Pulmonary/Chest: Effort normal and breath sounds normal. No respiratory distress. He exhibits no tenderness.  Abdominal: Soft. Normal appearance and bowel sounds are normal. There is no hepatosplenomegaly. There is no tenderness. There is no rebound, no guarding, no tenderness at McBurney's point and negative Murphy's sign. No hernia.  Musculoskeletal: Normal range of motion.       Lumbar back: He exhibits tenderness.  Diffuse lumbar tenderness; pain with flexion of the torso and SLRs; normal strength and sensation  Neurological: He is alert and oriented to person, place, and time. He has normal strength. No cranial nerve deficit or sensory deficit. Coordination normal. GCS eye subscore is 4. GCS verbal subscore is 5. GCS motor subscore is 6.  Skin: Skin is warm, dry and intact. No rash noted. No cyanosis.  Psychiatric: He has a normal mood and affect. His speech is normal and behavior is normal. Thought content normal.  Nursing note and vitals reviewed.   ED Course  Procedures (including  critical care time) DIAGNOSTIC STUDIES: Oxygen Saturation is 100% on RA, normal by my interpretation.    COORDINATION OF CARE: 1:49 AM-Discussed treatment plan which includes labs with pt at bedside and pt agreed to plan.    Labs Review Labs Reviewed  LIPASE, BLOOD - Abnormal; Notable for the following:    Lipase 89 (*)    All other components within normal limits  COMPREHENSIVE METABOLIC PANEL - Abnormal; Notable for the following:    BUN 30 (*)    Creatinine, Ser 7.85 (*)    Calcium 8.8 (*)    Albumin 2.7 (*)    AST 52 (*)    ALT 14 (*)    GFR calc non Af Amer 7 (*)    GFR calc Af Amer 8 (*)    All other components within normal limits  CBC - Abnormal; Notable for the following:    RBC 3.10 (*)    Hemoglobin 9.1 (*)    HCT 27.8 (*)    RDW 16.7 (*)    All other components within normal limits  URINALYSIS, ROUTINE W REFLEX MICROSCOPIC (NOT AT [facility])    Imaging Review No results found. I have personally reviewed and evaluated these images and lab results as part of my medical decision-making.   EKG Interpretation None      MDM   Final diagnoses:  None  low back pain  Patient presents to the emergency department for evaluation of back pain. Patient reports that he has a history of recurrent back pain with similar features. Patient reports sharp pain in the lower back that worsens with movement. He denies any direct injury. Examination revealed diffuse tenderness over the soft tissues of the low back with increased pain with movement. He has normal lower extremity strength and sensation. No foot drop. No saddle anesthesia. No concern for any significant neurologic impingement. He reports that he had nausea and vomiting earlier. Abdominal exam is benign and nontender. X-ray does not show any evidence of obstruction.  I personally performed the services described in this documentation, which was scribed in my presence. The recorded information has been reviewed and is  accurate.  Orpah Greek, MD 10/29/15 [phone]

## 2015-10-29 NOTE — ED Notes (Signed)
The pt is a dialysis pt he is c/o abd pain with n v for 3-4 days

## 2015-10-29 NOTE — Discharge Instructions (Signed)
Back Pain, Adult °Back pain is very common in adults. The cause of back pain is rarely dangerous and the pain often gets better over time. The cause of your back pain may not be known. Some common causes of back pain include: °· Strain of the muscles or ligaments supporting the spine. °· Wear and tear (degeneration) of the spinal disks. °· Arthritis. °· Direct injury to the back. °For many people, back pain may return. Since back pain is rarely dangerous, most people can learn to manage this condition on their own. °HOME CARE INSTRUCTIONS °Watch your back pain for any changes. The following actions may help to lessen any discomfort you are feeling: °· Remain active. It is stressful on your back to sit or stand in one place for long periods of time. Do not sit, drive, or stand in one place for more than 30 minutes at a time. Take short walks on even surfaces as soon as you are able. Try to increase the length of time you walk each day. °· Exercise regularly as directed by your health care provider. Exercise helps your back heal faster. It also helps avoid future injury by keeping your muscles strong and flexible. °· Do not stay in bed. Resting more than 1-2 days can delay your recovery. °· Pay attention to your body when you bend and lift. The most comfortable positions are those that put less stress on your recovering back. Always use proper lifting techniques, including: °¨ Bending your knees. °¨ Keeping the load close to your body. °¨ Avoiding twisting. °· Find a comfortable position to sleep. Use a firm mattress and lie on your side with your knees slightly bent. If you lie on your back, put a pillow under your knees. °· Avoid feeling anxious or stressed. Stress increases muscle tension and can worsen back pain. It is important to recognize when you are anxious or stressed and learn ways to manage it, such as with exercise. °· Take medicines only as directed by your health care provider. Over-the-counter  medicines to reduce pain and inflammation are often the most helpful. Your health care provider may prescribe muscle relaxant drugs. These medicines help dull your pain so you can more quickly return to your normal activities and healthy exercise. °· Apply ice to the injured area: °¨ Put ice in a plastic bag. °¨ Place a towel between your skin and the bag. °¨ Leave the ice on for 20 minutes, 2-3 times a day for the first 2-3 days. After that, ice and heat may be alternated to reduce pain and spasms. °· Maintain a healthy weight. Excess weight puts extra stress on your back and makes it difficult to maintain good posture. °SEEK MEDICAL CARE IF: °· You have pain that is not relieved with rest or medicine. °· You have increasing pain going down into the legs or buttocks. °· You have pain that does not improve in one week. °· You have night pain. °· You lose weight. °· You have a fever or chills. °SEEK IMMEDIATE MEDICAL CARE IF:  °· You develop new bowel or bladder control problems. °· You have unusual weakness or numbness in your arms or legs. °· You develop nausea or vomiting. °· You develop abdominal pain. °· You feel faint. °  °This information is not intended to replace advice given to you by your health care provider. Make sure you discuss any questions you have with your health care provider. °  °Document Released: 07/08/2005 Document Revised: 07/29/2014 Document Reviewed: 11/09/2013 °[employer] Interactive Patient Education ©2016 [employer]

## 2015-10-30 ENCOUNTER — Inpatient Hospital Stay (HOSPITAL_COMMUNITY): Payer: Medicare Other

## 2015-10-30 ENCOUNTER — Inpatient Hospital Stay (HOSPITAL_COMMUNITY)
Admission: EM | Admit: 2015-10-30 | Discharge: 2015-11-04 | DRG: 686 | Disposition: A | Discharge status: Home / Self Care | Payer: Medicare Other | Attending: Internal Medicine | Admitting: Internal Medicine

## 2015-10-30 ENCOUNTER — Emergency Department (HOSPITAL_COMMUNITY): Payer: Medicare Other

## 2015-10-30 ENCOUNTER — Encounter (HOSPITAL_COMMUNITY): Payer: Self-pay | Admitting: Emergency Medicine

## 2015-10-30 DIAGNOSIS — Z9115 Patient's noncompliance with renal dialysis: Secondary | ICD-10-CM

## 2015-10-30 DIAGNOSIS — I2782 Chronic pulmonary embolism: Secondary | ICD-10-CM | POA: Diagnosis not present

## 2015-10-30 DIAGNOSIS — N186 End stage renal disease: Secondary | ICD-10-CM | POA: Diagnosis not present

## 2015-10-30 DIAGNOSIS — K5900 Constipation, unspecified: Secondary | ICD-10-CM | POA: Diagnosis not present

## 2015-10-30 DIAGNOSIS — Z9119 Patient's noncompliance with other medical treatment and regimen: Secondary | ICD-10-CM

## 2015-10-30 DIAGNOSIS — Z79899 Other long term (current) drug therapy: Secondary | ICD-10-CM | POA: Diagnosis not present

## 2015-10-30 DIAGNOSIS — Z6820 Body mass index (BMI) 20.0-20.9, adult: Secondary | ICD-10-CM | POA: Diagnosis not present

## 2015-10-30 DIAGNOSIS — M545 Low back pain, unspecified: Secondary | ICD-10-CM

## 2015-10-30 DIAGNOSIS — E1122 Type 2 diabetes mellitus with diabetic chronic kidney disease: Secondary | ICD-10-CM | POA: Diagnosis present

## 2015-10-30 DIAGNOSIS — T8612 Kidney transplant failure: Secondary | ICD-10-CM | POA: Diagnosis present

## 2015-10-30 DIAGNOSIS — F1910 Other psychoactive substance abuse, uncomplicated: Secondary | ICD-10-CM

## 2015-10-30 DIAGNOSIS — Z87891 Personal history of nicotine dependence: Secondary | ICD-10-CM | POA: Diagnosis not present

## 2015-10-30 DIAGNOSIS — I313 Pericardial effusion (noninflammatory): Secondary | ICD-10-CM | POA: Diagnosis not present

## 2015-10-30 DIAGNOSIS — I5042 Chronic combined systolic (congestive) and diastolic (congestive) heart failure: Secondary | ICD-10-CM | POA: Diagnosis present

## 2015-10-30 DIAGNOSIS — I120 Hypertensive chronic kidney disease with stage 5 chronic kidney disease or end stage renal disease: Secondary | ICD-10-CM | POA: Diagnosis not present

## 2015-10-30 DIAGNOSIS — I160 Hypertensive urgency: Secondary | ICD-10-CM | POA: Diagnosis present

## 2015-10-30 DIAGNOSIS — E785 Hyperlipidemia, unspecified: Secondary | ICD-10-CM | POA: Diagnosis present

## 2015-10-30 DIAGNOSIS — K5901 Slow transit constipation: Secondary | ICD-10-CM

## 2015-10-30 DIAGNOSIS — F419 Anxiety disorder, unspecified: Secondary | ICD-10-CM | POA: Diagnosis present

## 2015-10-30 DIAGNOSIS — E8770 Fluid overload, unspecified: Secondary | ICD-10-CM | POA: Diagnosis not present

## 2015-10-30 DIAGNOSIS — R112 Nausea with vomiting, unspecified: Secondary | ICD-10-CM

## 2015-10-30 DIAGNOSIS — N2889 Other specified disorders of kidney and ureter: Secondary | ICD-10-CM | POA: Diagnosis not present

## 2015-10-30 DIAGNOSIS — Z992 Dependence on renal dialysis: Secondary | ICD-10-CM | POA: Diagnosis not present

## 2015-10-30 DIAGNOSIS — Z7952 Long term (current) use of systemic steroids: Secondary | ICD-10-CM | POA: Diagnosis not present

## 2015-10-30 DIAGNOSIS — I10 Essential (primary) hypertension: Secondary | ICD-10-CM | POA: Diagnosis present

## 2015-10-30 DIAGNOSIS — Z7901 Long term (current) use of anticoagulants: Secondary | ICD-10-CM | POA: Diagnosis not present

## 2015-10-30 DIAGNOSIS — I132 Hypertensive heart and chronic kidney disease with heart failure and with stage 5 chronic kidney disease, or end stage renal disease: Secondary | ICD-10-CM | POA: Diagnosis present

## 2015-10-30 DIAGNOSIS — C642 Malignant neoplasm of left kidney, except renal pelvis: Secondary | ICD-10-CM | POA: Diagnosis not present

## 2015-10-30 DIAGNOSIS — N281 Cyst of kidney, acquired: Secondary | ICD-10-CM

## 2015-10-30 DIAGNOSIS — D638 Anemia in other chronic diseases classified elsewhere: Secondary | ICD-10-CM | POA: Diagnosis present

## 2015-10-30 DIAGNOSIS — I319 Disease of pericardium, unspecified: Secondary | ICD-10-CM | POA: Diagnosis not present

## 2015-10-30 DIAGNOSIS — E46 Unspecified protein-calorie malnutrition: Secondary | ICD-10-CM | POA: Diagnosis present

## 2015-10-30 DIAGNOSIS — Z765 Malingerer [conscious simulation]: Secondary | ICD-10-CM

## 2015-10-30 DIAGNOSIS — I3139 Other pericardial effusion (noninflammatory): Secondary | ICD-10-CM

## 2015-10-30 DIAGNOSIS — E1129 Type 2 diabetes mellitus with other diabetic kidney complication: Secondary | ICD-10-CM | POA: Diagnosis not present

## 2015-10-30 HISTORY — DX: Slow transit constipation: K59.01

## 2015-10-30 HISTORY — DX: Cyst of kidney, acquired: N28.1

## 2015-10-30 HISTORY — DX: Other specified disorders of kidney and ureter: N28.89

## 2015-10-30 LAB — PROTIME-INR
INR: 1.4 (ref 0.00–1.49)
Prothrombin Time: 17.2 seconds — ABNORMAL HIGH (ref 11.6–15.2)

## 2015-10-30 LAB — LIPASE, BLOOD: Lipase: 52 U/L — ABNORMAL HIGH (ref 11–51)

## 2015-10-30 LAB — GLUCOSE, CAPILLARY: GLUCOSE-CAPILLARY: 78 mg/dL (ref 65–99)

## 2015-10-30 MED ORDER — IOPAMIDOL (ISOVUE-300) INJECTION 61%
INTRAVENOUS | Status: AC
  Administered 2015-10-30: 100 mL
  Filled 2015-10-30: qty 100

## 2015-10-30 MED ORDER — ADULT MULTIVITAMIN W/MINERALS CH
1.0000 | ORAL_TABLET | Freq: Every day | ORAL | Status: DC
  Administered 2015-11-01 – 2015-11-04 (×4): 1 via ORAL
  Filled 2015-10-30 (×5): qty 1

## 2015-10-30 MED ORDER — ONDANSETRON HCL 4 MG PO TABS
4.0000 mg | ORAL_TABLET | Freq: Four times a day (QID) | ORAL | Status: DC | PRN
  Filled 2015-10-30: qty 1

## 2015-10-30 MED ORDER — LORAZEPAM 1 MG PO TABS: 1.0000 mg | ORAL_TABLET | Freq: Four times a day (QID) | ORAL | Status: DC | PRN

## 2015-10-30 MED ORDER — ONDANSETRON 4 MG PO TBDP: 8.0000 mg | ORAL_TABLET | Freq: Once | ORAL | Status: DC

## 2015-10-30 MED ORDER — ACETAMINOPHEN 325 MG PO TABS: 650.0000 mg | ORAL_TABLET | Freq: Four times a day (QID) | ORAL | Status: DC | PRN

## 2015-10-30 MED ORDER — BISACODYL 10 MG RE SUPP
10.0000 mg | Freq: Once | RECTAL | Status: DC
  Filled 2015-10-30: qty 1

## 2015-10-30 MED ORDER — SODIUM CHLORIDE 0.9% FLUSH
3.0000 mL | Freq: Two times a day (BID) | INTRAVENOUS | Status: DC
  Administered 2015-10-30 – 2015-11-04 (×10): 3 mL via INTRAVENOUS

## 2015-10-30 MED ORDER — SEVELAMER CARBONATE 800 MG PO TABS: 800.0000 mg | ORAL_TABLET | Freq: Two times a day (BID) | ORAL | Status: DC | PRN

## 2015-10-30 MED ORDER — LORAZEPAM 2 MG/ML IJ SOLN
1.0000 mg | Freq: Four times a day (QID) | INTRAMUSCULAR | Status: DC | PRN
  Administered 2015-10-31 – 2015-11-01 (×2): 1 mg via INTRAVENOUS
  Filled 2015-10-30 (×2): qty 1

## 2015-10-30 MED ORDER — SEVELAMER CARBONATE 800 MG PO TABS: 800.0000 mg | ORAL_TABLET | Freq: Three times a day (TID) | ORAL | Status: DC

## 2015-10-30 MED ORDER — LISINOPRIL 10 MG PO TABS
20.0000 mg | ORAL_TABLET | Freq: Every day | ORAL | Status: DC
  Administered 2015-11-01 – 2015-11-04 (×4): 20 mg via ORAL
  Filled 2015-10-30: qty 4
  Filled 2015-10-30 (×3): qty 2
  Filled 2015-10-30: qty 1
  Filled 2015-10-30: qty 2

## 2015-10-30 MED ORDER — RENA-VITE PO TABS
1.0000 | ORAL_TABLET | Freq: Every day | ORAL | Status: DC
  Administered 2015-10-31 – 2015-11-03 (×4): 1 via ORAL
  Filled 2015-10-30 (×5): qty 1

## 2015-10-30 MED ORDER — FOLIC ACID 1 MG PO TABS
1.0000 mg | ORAL_TABLET | Freq: Every day | ORAL | Status: DC
  Administered 2015-11-01 – 2015-11-04 (×4): 1 mg via ORAL
  Filled 2015-10-30 (×5): qty 1

## 2015-10-30 MED ORDER — DIPHENHYDRAMINE HCL 50 MG/ML IJ SOLN
25.0000 mg | Freq: Once | INTRAMUSCULAR | Status: AC
  Administered 2015-10-30: 25 mg via INTRAVENOUS
  Filled 2015-10-30: qty 1

## 2015-10-30 MED ORDER — PANTOPRAZOLE SODIUM 40 MG PO TBEC
40.0000 mg | DELAYED_RELEASE_TABLET | Freq: Every day | ORAL | Status: DC
  Administered 2015-10-30 – 2015-11-04 (×5): 40 mg via ORAL
  Filled 2015-10-30 (×6): qty 1

## 2015-10-30 MED ORDER — HYDRALAZINE HCL 50 MG PO TABS
100.0000 mg | ORAL_TABLET | Freq: Three times a day (TID) | ORAL | Status: DC
  Administered 2015-10-30 – 2015-11-04 (×14): 100 mg via ORAL
  Filled 2015-10-30 (×12): qty 2
  Filled 2015-10-30: qty 4
  Filled 2015-10-30 (×2): qty 2

## 2015-10-30 MED ORDER — DARBEPOETIN ALFA 100 MCG/0.5ML IJ SOSY
100.0000 ug | PREFILLED_SYRINGE | INTRAMUSCULAR | Status: DC
  Filled 2015-10-30: qty 0.5

## 2015-10-30 MED ORDER — LACTULOSE 10 GM/15ML PO SOLN
20.0000 g | Freq: Once | ORAL | Status: AC
Start: 2015-10-30 — End: 2015-10-30
  Administered 2015-10-30: 20 g via ORAL
  Filled 2015-10-30: qty 30

## 2015-10-30 MED ORDER — PREDNISONE 5 MG PO TABS
5.0000 mg | ORAL_TABLET | Freq: Every day | ORAL | Status: DC
  Administered 2015-11-01: 5 mg via ORAL
  Filled 2015-10-30: qty 1

## 2015-10-30 MED ORDER — DOXERCALCIFEROL 4 MCG/2ML IV SOLN
2.5000 ug | INTRAVENOUS | Status: DC
  Administered 2015-11-02 – 2015-11-04 (×2): 2.5 ug via INTRAVENOUS
  Filled 2015-10-30 (×3): qty 2

## 2015-10-30 MED ORDER — THIAMINE HCL 100 MG/ML IJ SOLN
100.0000 mg | Freq: Every day | INTRAMUSCULAR | Status: DC
  Administered 2015-10-31: 100 mg via INTRAVENOUS
  Filled 2015-10-30 (×3): qty 2

## 2015-10-30 MED ORDER — ISOSORBIDE MONONITRATE ER 60 MG PO TB24
90.0000 mg | ORAL_TABLET | Freq: Every day | ORAL | Status: DC
  Administered 2015-11-01 – 2015-11-04 (×4): 90 mg via ORAL
  Filled 2015-10-30 (×5): qty 1

## 2015-10-30 MED ORDER — SEVELAMER CARBONATE 800 MG PO TABS
1600.0000 mg | ORAL_TABLET | Freq: Three times a day (TID) | ORAL | Status: DC
  Administered 2015-10-30 – 2015-11-04 (×8): 1600 mg via ORAL
  Filled 2015-10-30 (×11): qty 2

## 2015-10-30 MED ORDER — CARVEDILOL 6.25 MG PO TABS
6.2500 mg | ORAL_TABLET | Freq: Two times a day (BID) | ORAL | Status: DC
  Administered 2015-10-31 – 2015-11-01 (×3): 6.25 mg via ORAL
  Filled 2015-10-30 (×4): qty 1

## 2015-10-30 MED ORDER — ALPRAZOLAM 0.5 MG PO TABS
0.5000 mg | ORAL_TABLET | Freq: Two times a day (BID) | ORAL | Status: DC | PRN
Start: 2015-10-30 — End: 2015-11-04
  Administered 2015-11-01 – 2015-11-03 (×3): 0.5 mg via ORAL
  Filled 2015-10-30 (×2): qty 1
  Filled 2015-10-30: qty 2
  Filled 2015-10-30: qty 1

## 2015-10-30 MED ORDER — GABAPENTIN 100 MG PO CAPS
100.0000 mg | ORAL_CAPSULE | Freq: Three times a day (TID) | ORAL | Status: DC
  Administered 2015-10-30 – 2015-11-04 (×11): 100 mg via ORAL
  Filled 2015-10-30 (×14): qty 1

## 2015-10-30 MED ORDER — HYDROCODONE-ACETAMINOPHEN 5-325 MG PO TABS
1.0000 | ORAL_TABLET | Freq: Four times a day (QID) | ORAL | Status: DC | PRN
  Administered 2015-10-30 – 2015-11-02 (×9): 1 via ORAL
  Filled 2015-10-30 (×9): qty 1

## 2015-10-30 MED ORDER — LABETALOL HCL 5 MG/ML IV SOLN
5.0000 mg | INTRAVENOUS | Status: DC | PRN
Start: 2015-10-30 — End: 2015-11-04
  Administered 2015-10-30 – 2015-10-31 (×3): 10 mg via INTRAVENOUS
  Administered 2015-10-31: 5 mg via INTRAVENOUS
  Filled 2015-10-30 (×4): qty 4

## 2015-10-30 MED ORDER — WARFARIN SODIUM 10 MG PO TABS
10.0000 mg | ORAL_TABLET | Freq: Once | ORAL | Status: AC
  Administered 2015-10-30: 10 mg via ORAL
  Filled 2015-10-30: qty 1

## 2015-10-30 MED ORDER — METOCLOPRAMIDE HCL 5 MG/ML IJ SOLN
10.0000 mg | Freq: Once | INTRAMUSCULAR | Status: AC
  Administered 2015-10-30: 10 mg via INTRAVENOUS
  Filled 2015-10-30: qty 2

## 2015-10-30 MED ORDER — LEVALBUTEROL HCL 1.25 MG/0.5ML IN NEBU: 1.2500 mg | INHALATION_SOLUTION | Freq: Four times a day (QID) | RESPIRATORY_TRACT | Status: DC | PRN

## 2015-10-30 MED ORDER — ATORVASTATIN CALCIUM 40 MG PO TABS
40.0000 mg | ORAL_TABLET | Freq: Every day | ORAL | Status: DC
  Administered 2015-10-31 – 2015-11-03 (×4): 40 mg via ORAL
  Filled 2015-10-30 (×4): qty 1

## 2015-10-30 MED ORDER — HYDROMORPHONE HCL 1 MG/ML IJ SOLN
1.0000 mg | INTRAMUSCULAR | Status: DC | PRN
  Administered 2015-10-30 – 2015-11-01 (×11): 1 mg via INTRAVENOUS
  Filled 2015-10-30 (×10): qty 1

## 2015-10-30 MED ORDER — HYDROMORPHONE HCL 1 MG/ML IJ SOLN
1.0000 mg | Freq: Once | INTRAMUSCULAR | Status: AC
  Administered 2015-10-30: 1 mg via INTRAVENOUS
  Filled 2015-10-30: qty 1

## 2015-10-30 MED ORDER — DOCUSATE SODIUM 100 MG PO CAPS
100.0000 mg | ORAL_CAPSULE | Freq: Two times a day (BID) | ORAL | Status: DC
  Administered 2015-10-30 – 2015-11-04 (×7): 100 mg via ORAL
  Filled 2015-10-30 (×12): qty 1

## 2015-10-30 MED ORDER — ONDANSETRON HCL 4 MG/2ML IJ SOLN
4.0000 mg | Freq: Four times a day (QID) | INTRAMUSCULAR | Status: DC | PRN
  Administered 2015-10-30 – 2015-11-02 (×11): 4 mg via INTRAVENOUS
  Filled 2015-10-30 (×11): qty 2

## 2015-10-30 MED ORDER — LEVALBUTEROL TARTRATE 45 MCG/ACT IN AERO: 2.0000 | INHALATION_SPRAY | Freq: Four times a day (QID) | RESPIRATORY_TRACT | Status: DC | PRN

## 2015-10-30 MED ORDER — SODIUM CHLORIDE 0.9 % IV BOLUS (SEPSIS): 1000.0000 mL | Freq: Once | INTRAVENOUS | Status: DC

## 2015-10-30 MED ORDER — HYDROXYZINE HCL 10 MG PO TABS
10.0000 mg | ORAL_TABLET | Freq: Three times a day (TID) | ORAL | Status: DC | PRN
  Administered 2015-10-31 – 2015-11-04 (×2): 10 mg via ORAL
  Filled 2015-10-30 (×3): qty 1

## 2015-10-30 MED ORDER — ACETAMINOPHEN 650 MG RE SUPP: 650.0000 mg | Freq: Four times a day (QID) | RECTAL | Status: DC | PRN

## 2015-10-30 MED ORDER — AMLODIPINE BESYLATE 10 MG PO TABS
10.0000 mg | ORAL_TABLET | Freq: Every day | ORAL | Status: DC
  Administered 2015-10-31 – 2015-11-04 (×5): 10 mg via ORAL
  Filled 2015-10-30 (×6): qty 1

## 2015-10-30 MED ORDER — WARFARIN - PHARMACIST DOSING INPATIENT
Freq: Every day | Status: DC
  Administered 2015-10-30: 18:00:00

## 2015-10-30 MED ORDER — VITAMIN B-1 100 MG PO TABS
100.0000 mg | ORAL_TABLET | Freq: Every day | ORAL | Status: DC
  Administered 2015-11-01 – 2015-11-04 (×4): 100 mg via ORAL
  Filled 2015-10-30 (×4): qty 1

## 2015-10-30 MED ORDER — CINACALCET HCL 30 MG PO TABS
30.0000 mg | ORAL_TABLET | Freq: Every day | ORAL | Status: DC
  Administered 2015-10-30 – 2015-11-04 (×5): 30 mg via ORAL
  Filled 2015-10-30 (×6): qty 1

## 2015-10-30 NOTE — Consult Note (Signed)
HPI: The patient is a 50 y.o. year-old with PMH sig for DM, HTN, ESRD, CHF (EF 25%), anemia, and noncompliance with dialysis. Has Dialysis home unit is in [facility] but doesn't show up there very often- TTS. His last inpatient treatment at [facility] was 10/17/15 after which he left AMA.  After that he has been jailed- he has had trips to the ER for various things but since he has been jailed he probably has been getting his HD.  He presents now with nausea and abdominal/ back pain- labs early on 4/9 show a K of 4.9 and a BUN of 51 so does not need HD today.  He is being admitted for work up of a renal mass that he has had before but is larger- 4x4 cm and also for workup of a moderate pericardial effusion and management of blood pressure off meds.  He tells me this time to be a more "normal" dialysis patient- even had an appointment to get AV fistula placed.  Says he has gone down to as low as 159  pounds post dialysis.   Past Medical History  Diagnosis Date  . Hypertension   . Depression   . Complication of anesthesia     itching, sore throat  . Shortness of breath   . Anxiety   . CHF (congestive heart failure) ([place])   . Anemia   . Dialysis patient ([place])   . Type II diabetes mellitus ([facility])     No history per patient, but remains under history as A1c would not be accurate given on dialysis  . ESRD (end stage renal disease) ([place])     due to HTN per patient, followed at [facility], s/p failed kidney transplant - dialysis Tue, Th, Sat  . Renal insufficiency    Past Surgical History  Procedure Laterality Date  . Kidney receipient  2006    failed and started HD in March 2014  . Capd insertion    . Capd removal    . Left heart catheterization with coronary angiogram N/A 09/02/2014    Procedure: LEFT HEART CATHETERIZATION WITH CORONARY ANGIOGRAM;  Surgeon: [name], MD;  Location: [facility] CATH LAB;  Service: Cardiovascular;  Laterality: N/A;  . Inguinal hernia repair Right 02/14/2015    Procedure:  REPAIR INCARCERATED RIGHT INGUINAL HERNIA;  Surgeon: [name], MD;  Location: [place];  Service: General;  Laterality: Right;  . Insertion of dialysis catheter Right 09/23/2015    Procedure: exchange of Right internal Dialysis Catheter.;  Surgeon: [name], MD;  Location: [facility] OR;  Service: Vascular;  Laterality: Right;   Social History:  reports that he has quit smoking. His smoking use included Cigarettes. He smoked 0.00 packs per day for 1 year. He has never used smokeless tobacco. He reports that he does not drink alcohol or use illicit drugs. Allergies:  Allergies  Allergen Reactions  . Butalbital-Apap-Caffeine Shortness Of Breath and Swelling    Swelling in throat  . Ferrlecit [Na Ferric Gluc Cplx In Sucrose] Shortness Of Breath, Swelling and Other (See Comments)    Swelling in throat  . Minoxidil Shortness Of Breath  . Darvocet [Propoxyphene N-Acetaminophen] Hives   Family History  Problem Relation Age of Onset  . Hypertension Other     Medications:  Prior to Admission:  Prescriptions prior to admission  Medication Sig Dispense Refill Last Dose  . ALPRAZolam (XANAX) 0.5 MG tablet Take 1 tablet (0.5 mg total) by mouth 2 (two) times daily as needed for  anxiety. 10 tablet 0 10/28/2015 at Unknown time  . amLODipine (NORVASC) 10 MG tablet Take 10 mg by mouth daily.   10/28/2015 at Unknown time  . atorvastatin (LIPITOR) 40 MG tablet Take 1 tablet (40 mg total) by mouth daily at 6 PM. 30 tablet 1 10/28/2015 at Unknown time  . carvedilol (COREG) 6.25 MG tablet Take 1 tablet (6.25 mg total) by mouth 2 (two) times daily with a meal. 60 tablet 3 10/28/2015 at 1800  . cinacalcet (SENSIPAR) 30 MG tablet Take 30 mg by mouth daily.   10/28/2015 at Unknown time  . docusate sodium (COLACE) 100 MG capsule Take 1 capsule (100 mg total) by mouth 2 (two) times daily. 60 capsule 0 10/28/2015 at Unknown time  . doxercalciferol (HECTOROL) 4 MCG/2ML injection Inject 1.25 mLs (2.5 mcg total) into the vein Every  Tuesday,Thursday,and Saturday with dialysis. 2 mL  Past Week at Unknown time  . gabapentin (NEURONTIN) 100 MG capsule Take 100 mg by mouth 3 (three) times daily.   10/28/2015 at Unknown time  . hydrALAZINE (APRESOLINE) 50 MG tablet Take 2 tablets (100 mg total) by mouth every 8 (eight) hours. 90 tablet 1 10/28/2015 at Unknown time  . hydrocerin (EUCERIN) CREA Apply 1 application topically 2 (two) times daily. 454 g 0 10/28/2015 at Unknown time  . HYDROcodone-acetaminophen (NORCO/VICODIN) 5-325 MG tablet Take 1 tablet by mouth every 6 (six) hours as needed. (Patient taking differently: Take 1 tablet by mouth every 6 (six) hours as needed for moderate pain. ) 10 tablet 0 10/28/2015 at Unknown time  . hydrOXYzine (ATARAX/VISTARIL) 10 MG tablet Take 1 tablet (10 mg total) by mouth 3 (three) times daily as needed for itching. 30 tablet 0 10/28/2015 at Unknown time  . ibuprofen (ADVIL,MOTRIN) 600 MG tablet Take 1 tablet (600 mg total) by mouth every 8 (eight) hours as needed. 15 tablet 0 10/28/2015 at Unknown time  . isosorbide mononitrate (IMDUR) 30 MG 24 hr tablet Take 3 tablets (90 mg total) by mouth daily. 90 tablet 0 10/28/2015 at Unknown time  . levalbuterol (XOPENEX HFA) 45 MCG/ACT inhaler Inhale 2 puffs into the lungs every 6 (six) hours as needed for wheezing or shortness of breath. 1 Inhaler 2 10/28/2015 at Unknown time  . lisinopril (PRINIVIL,ZESTRIL) 20 MG tablet Take 1 tablet (20 mg total) by mouth daily. 30 tablet 1 10/28/2015 at Unknown time  . methocarbamol (ROBAXIN) 500 MG tablet Take 1 tablet (500 mg total) by mouth every 8 (eight) hours as needed for muscle spasms. 12 tablet 0 10/28/2015 at Unknown time  . multivitamin (RENA-VIT) TABS tablet Take 1 tablet by mouth at bedtime. 30 tablet 1 10/28/2015 at Unknown time  . omeprazole (PRILOSEC) 20 MG capsule Take 20 mg by mouth daily.   10/28/2015 at Unknown time  . predniSONE (DELTASONE) 5 MG tablet Take 5 mg by mouth daily with breakfast.   10/28/2015 at Unknown time   . sevelamer carbonate (RENVELA) 800 MG tablet Take 800-1,600 mg by mouth 3 (three) times daily with meals. 2 tabs three times daily with meals, and 1 tablet with snacks   10/28/2015 at Unknown time  . warfarin (COUMADIN) 6 MG tablet Take 1.5 tablets (9 mg total) by mouth daily at 6 PM. Take daily until adjusted per MD 20 tablet 0 10/28/2015 at 1800  . cyclobenzaprine (FLEXERIL) 10 MG tablet Take 1 tablet (10 mg total) by mouth 3 (three) times daily as needed for muscle spasms. 20 tablet 0   . ondansetron (ZOFRAN)  4 MG tablet Take 1 tablet (4 mg total) by mouth every 6 (six) hours. 12 tablet 0   . traMADol (ULTRAM) 50 MG tablet Take 1 tablet (50 mg total) by mouth every 6 (six) hours as needed. 15 tablet 0    ROS: as per HPI  Blood pressure 168/112, pulse 77, temperature 97.8 F (36.6 C), temperature source Oral, resp. rate 18, height _0  (1.88 m), weight 73.9 kg (162 lb 14.7 oz), SpO2 99 %.  General appearance: alert and cooperative Head: Normocephalic, without obvious abnormality, atraumatic, neck veins bulging Nose: Nares normal. Septum midline. Mucosa normal. No drainage or sinus tenderness. Throat: lips, mucosa, and tongue normal; teeth and gums normal Resp: diminished breath sounds bilaterally Chest wall: no tenderness Cardio: regular rate and rhythm, S1, S2 normal, no murmur, click, rub or gallop GI: soft, non-tender; bowel sounds normal; no masses,  no organomegaly Extremities: edema 2-3+ Skin: Skin color, texture, turgor normal. No rashes or lesions Neurologic: Grossly normal Results for orders placed or performed during the hospital encounter of 10/30/15 (from the past 48 hour(s))  Lipase, blood     Status: Abnormal   Collection Time: 10/30/15  6:20 AM  Result Value Ref Range   Lipase 52 (H) 11 - 51 U/L  Protime-INR     Status: Abnormal   Collection Time: 10/30/15  7:57 AM  Result Value Ref Range   Prothrombin Time 17.2 (H) 11.6 - 15.2 seconds   INR 1.40 0.00 - 1.49  Glucose,  capillary     Status: None   Collection Time: 10/30/15  9:44 AM  Result Value Ref Range   Glucose-Capillary 78 65 - 99 mg/dL   Comment 1 Notify RN    Dg Abd Acute W/chest  10/29/2015  CLINICAL DATA:  Nausea and vomiting for several days. EXAM: DG ABDOMEN ACUTE W/ 1V CHEST COMPARISON:  10/19/2015 FINDINGS: There is a right jugular central line with tip in the low SVC. There is moderate unchanged cardiomegaly. Mild chronic appearing interstitial coarsening is present, unchanged. No consolidation. No large effusion. The abdominal gas pattern is negative for obstruction or perforation. There is a generous volume of stool throughout the colon. No biliary or urinary calculi are evident. IMPRESSION: Negative abdominal radiographs. No acute cardiopulmonary disease. Unchanged cardiomegaly. Electronically Signed   By: Andreas Newport M.D.   On: 10/29/2015 03:15   Ct Renal Stone Study  10/30/2015  CLINICAL DATA:  Back pain. Nausea vomiting 2 days. Dialysis patient. EXAM: CT ABDOMEN AND PELVIS WITHOUT CONTRAST TECHNIQUE: Multidetector CT imaging of the abdomen and pelvis was performed following the standard protocol without IV contrast. COMPARISON:  CT abdomen pelvis 03/11/2015 FINDINGS: Lower chest: Cardiac enlargement with moderate pericardial effusion. Lung bases clear Hepatobiliary: The liver is mildly enlarged. No focal liver lesion. Gallbladder and bile ducts normal. Pancreas: Negative Spleen: Negative Adrenals/Urinary Tract: Left midpole renal mass measures 39 x 42 mm and shows significant interval growth since the prior study. Limited evaluation without intravenous contrast however this appears to be a solid lesion and is likely renal cell carcinoma. Atrophy of both kidneys without hydronephrosis. No renal calculi. Urinary bladder is collapsed. Renal transplant in the left pelvis unchanged from the prior study. Limited evaluation without intravenous contrast. Stomach/Bowel: Negative for bowel obstruction. No  bowel edema or mass. Appendix not well seen but no evidence of appendicitis. Negative for diverticulitis. Vascular/Lymphatic: Advanced atherosclerotic calcification in the aorta and iliac arteries. No aneurysm. No lymphadenopathy. Reproductive: Negative Other: Negative for ascites. Musculoskeletal: Negative IMPRESSION:  Left renal mass has enlarged since the prior CT and is consistent with carcinoma. Renal transplant left lower quadrant with limited evaluation without intravenous contrast. Moderate pericardial effusion Electronically Signed   By: Franchot Gallo M.D.   On: 10/30/2015 07:22   Ct Renal Abd W/wo  10/30/2015  CLINICAL DATA:  Left renal mass, abdominal pain for 2 days with nausea and vomiting. EXAM: CT ABDOMEN AND PELVIS WITHOUT AND WITH CONTRAST TECHNIQUE: Multidetector CT imaging of the abdomen and pelvis was performed following the standard protocol before and following the bolus administration of intravenous contrast. CONTRAST:  100 cc Isovue-300. COMPARISON:  10/30/2015 and 03/11/2015. FINDINGS: Lower chest: Lung bases show no acute findings. Heart is moderately enlarged. Small pericardial effusion. No pericardial effusion. Hepatobiliary: Liver is heterogeneous. Gallbladder is unremarkable. No biliary ductal dilatation. Pancreas: Negative. Spleen: Negative. Adrenals/Urinary Tract: Adrenal glands are unremarkable. Native kidneys are atrophic. Heterogeneous mass off the interpolar left kidney measures 3.5 cm (401/54), previously 1.9 cm on 03/11/2015. It measures 33 Hounsfield units on precontrast imaging and 53 Hounsfield units on nephrographic phase imaging. Enhancement characteristics are most likely stunted by renal failure. Additional low-attenuation lesions in the kidneys measure up to 1.4 cm on the right, too small to characterize. Left renal transplant is partially imaged in the left iliac fossa. Stomach/Bowel: Stomach and visualized portions of the small bowel and colon are grossly  unremarkable. Vascular/Lymphatic: Atherosclerotic calcification of the arterial vasculature without abdominal aortic aneurysm. Scattered lymph nodes are not enlarged by CT size criteria. Other: No free fluid. Mesenteries and peritoneum are grossly unremarkable. Musculoskeletal: No worrisome lytic or sclerotic lesions. IMPRESSION: 1. Enlarging left renal mass with mild enhancement, worrisome for renal cell carcinoma. Please see discussion above. 2. Small pericardial effusion. 3. Heterogeneity within the liver parenchyma may be due to passive congestion related to impaired cardiac function. Electronically Signed   By: Lorin Picket M.D.   On: 10/30/2015 11:54    Assessment: 50 year old noncompliant HD patient presenting with abdominal/back pain and also imaging revealing a renal mass and a pericardial effusion  1 ESRD- notoriously noncompliant.  Normally TTS in [place].  Given his incarceration this may have improved his compliance- he also tells me his is trying to do better.  There do not seem to be any urgent indications for HD right now, will arrange for in the AM- has PC- says he missed his eval for an access.  EDW around 159- or 72 kg. and 2 Volume overload- chronically so and also gives him HTN- should improve with HD in AM 3 Non-adherence to dialysis- could certainly cause a uremic pericardial effusion 4. Renal mass- has been known in the past.  Seems unlikely that it would be causing pain unless bleeding into it and there does not seem to be clinical evidence of that.  Have to wonder about secondary gain of complaining of pain that necessitates repeated trips to the ER and narcotics.  Would eventually need kidney removed - urology has been consulted  5.  bones - will continue him on his home Renvela, Hectorol and Sensipar  6. Anemia - i'll add Aranesp   Aissa Lisowski A 10/30/2015, 3:50 PM

## 2015-10-30 NOTE — ED Notes (Signed)
Patient states he was discharged yesterday with complaints of back pain and nausea and states medication is not working. Patient states he is vomiting every 15 mins. And is unable to keep the medication "down".

## 2015-10-30 NOTE — ED Notes (Signed)
Admitting MD at bedside.

## 2015-10-30 NOTE — ED Provider Notes (Addendum)
CSN: [id number]     Arrival date & time 10/30/15  5364 History   First MD Initiated Contact with Patient 10/30/15 0541     Chief Complaint  Patient presents with  . Nausea  . Back Pain     (Consider location/radiation/quality/duration/timing/severity/associated sxs/prior Treatment) HPI Comments: Patient presents to the emergency department for evaluation of nausea and vomiting with back pain. Patient was seen last night for same. He was administered analgesia and improved, discharged. Patient reports that after he got home, however, he started having nausea and vomiting once again. He also has been experiencing low back pain. Patient is unchanged from his previous presentation.  Patient is a 50 y.o. male presenting with back pain.  Back Pain   Past Medical History  Diagnosis Date  . Hypertension   . Depression   . Complication of anesthesia     itching, sore throat  . Shortness of breath   . Anxiety   . CHF (congestive heart failure) ([place])   . Anemia   . Dialysis patient ([place])   . Type II diabetes mellitus ([facility])     No history per patient, but remains under history as A1c would not be accurate given on dialysis  . ESRD (end stage renal disease) ([place])     due to HTN per patient, followed at [facility], s/p failed kidney transplant - dialysis Tue, Th, Sat  . Renal insufficiency    Past Surgical History  Procedure Laterality Date  . Kidney receipient  2006    failed and started HD in March 2014  . Capd insertion    . Capd removal    . Left heart catheterization with coronary angiogram N/A 09/02/2014    Procedure: LEFT HEART CATHETERIZATION WITH CORONARY ANGIOGRAM;  Surgeon: [name], MD;  Location: [facility] CATH LAB;  Service: Cardiovascular;  Laterality: N/A;  . Inguinal hernia repair Right 02/14/2015    Procedure: REPAIR INCARCERATED RIGHT INGUINAL HERNIA;  Surgeon: [name], MD;  Location: [place];  Service: General;  Laterality: Right;  . Insertion of dialysis catheter Right  09/23/2015    Procedure: exchange of Right internal Dialysis Catheter.;  Surgeon: [name], MD;  Location: [facility] OR;  Service: Vascular;  Laterality: Right;   Family History  Problem Relation Age of Onset  . Hypertension Other    Social History  Substance Use Topics  . Smoking status: Former Smoker -- 0.00 packs/day for 1 years    Types: Cigarettes  . Smokeless tobacco: Never Used     Comment: quit Jan 2014  . Alcohol Use: No    Review of Systems  Gastrointestinal: Positive for nausea and vomiting.  Musculoskeletal: Positive for back pain.  All other systems reviewed and are negative.     Allergies  Butalbital-apap-caffeine; Ferrlecit; Minoxidil; and Darvocet  Home Medications   Prior to Admission medications   Medication Sig Start Date End Date Taking? Authorizing Provider  ALPRAZolam [name]) 0.5 MG tablet Take 1 tablet (0.5 mg total) by mouth 2 (two) times daily as needed for anxiety. 09/11/15  Yes [name], MD  amLODipine (NORVASC) 10 MG tablet Take 10 mg by mouth daily.   Yes Historical Provider, MD  atorvastatin (LIPITOR) 40 MG tablet Take 1 tablet (40 mg total) by mouth daily at 6 PM. 09/02/14  Yes Barton Dubois, MD  carvedilol (COREG) 6.25 MG tablet Take 1 tablet (6.25 mg total) by mouth 2 (two) times daily with a meal. 09/27/15  Yes [name], MD  cinacalcet (SENSIPAR) 30 MG tablet Take 30 mg by mouth daily.   Yes Historical Provider, MD  docusate sodium (COLACE) 100 MG capsule Take 1 capsule (100 mg total) by mouth 2 (two) times daily. 11/19/14  Yes [name], MD  doxercalciferol (HECTOROL) 4 MCG/2ML injection Inject 1.25 mLs (2.5 mcg total) into the vein Every Tuesday,Thursday,and Saturday with dialysis. 10/10/14  Yes [name], DO  gabapentin (NEURONTIN) 100 MG capsule Take 100 mg by mouth 3 (three) times daily. 06/14/15 10/30/15 Yes Historical Provider, MD  hydrALAZINE (APRESOLINE) 50 MG tablet Take 2 tablets (100 mg total) by mouth every 8  (eight) hours. 09/11/15  Yes [name], MD  hydrocerin (EUCERIN) CREA Apply 1 application topically 2 (two) times daily. 02/04/15  Yes [name], MD  HYDROcodone-acetaminophen (NORCO/VICODIN) 5-325 MG tablet Take 1 tablet by mouth every 6 (six) hours as needed. Patient taking differently: Take 1 tablet by mouth every 6 (six) hours as needed for moderate pain.  09/27/15  Yes [name], MD  hydrOXYzine (ATARAX/VISTARIL) 10 MG tablet Take 1 tablet (10 mg total) by mouth 3 (three) times daily as needed for itching. 09/27/15  Yes [name], MD  ibuprofen (ADVIL,MOTRIN) 600 MG tablet Take 1 tablet (600 mg total) by mouth every 8 (eight) hours as needed. 10/06/15  Yes [name], MD  isosorbide mononitrate (IMDUR) 30 MG 24 hr tablet Take 3 tablets (90 mg total) by mouth daily. 09/11/15  Yes [name], MD  levalbuterol [facility] H[facility]) 45 MCG/ACT inhaler Inhale 2 puffs into the lungs every 6 (six) hours as needed for wheezing or shortness of breath. 11/19/14  Yes [name], MD  lisinopril (PRINIVIL,ZESTRIL) 20 MG tablet Take 1 tablet (20 mg total) by mouth daily. 02/04/15  Yes [name], MD  methocarbamol (ROBAXIN) 500 MG tablet Take 1 tablet (500 mg total) by mouth every 8 (eight) hours as needed for muscle spasms. 10/06/15  Yes [name], MD  multivitamin (RENA-VIT) TABS tablet Take 1 tablet by mouth at bedtime. 02/04/15  Yes [name], MD  omeprazole (PRILOSEC) 20 MG capsule Take 20 mg by mouth daily. 07/01/13  Yes Historical Provider, MD  predniSONE (DELTASONE) 5 MG tablet Take 5 mg by mouth daily with breakfast.   Yes Historical Provider, MD  sevelamer carbonate (RENVELA) 800 MG tablet Take 800-1,600 mg by mouth 3 (three) times daily with meals. 2 tabs three times daily with meals, and 1 tablet with snacks   Yes Historical Provider, MD  warfarin (COUMADIN) 6 MG tablet Take 1.5 tablets (9 mg total) by mouth daily at 6 PM. Take daily until adjusted per MD 09/12/15   Yes [name], MD  cyclobenzaprine (FLEXERIL) 10 MG tablet Take 1 tablet (10 mg total) by mouth 3 (three) times daily as needed for muscle spasms. 10/29/15   Orpah Greek, MD  ondansetron (ZOFRAN) 4 MG tablet Take 1 tablet (4 mg total) by mouth every 6 (six) hours. 10/29/15   Orpah Greek, MD  traMADol (ULTRAM) 50 MG tablet Take 1 tablet (50 mg total) by mouth every 6 (six) hours as needed. 10/29/15   Orpah Greek, MD   BP 148/110 mmHg  Pulse 77  Temp(Src) 98.4 F (36.9 C) (Oral)  Resp 18  Ht _0  (1.88 m)  Wt 162 lb 14.7 oz (73.9 kg)  BMI 20.91 kg/m2  SpO2 100% Physical Exam  Constitutional: He is oriented to person, place, and time. He  appears well-developed and well-nourished. No distress.  HENT:  Head: Normocephalic and atraumatic.  Right Ear: Hearing normal.  Left Ear: Hearing normal.  Nose: Nose normal.  Mouth/Throat: Oropharynx is clear and moist and mucous membranes are normal.  Eyes: Conjunctivae and EOM are normal. Pupils are equal, round, and reactive to light.  Neck: Normal range of motion. Neck supple.  Cardiovascular: Regular rhythm, S1 normal and S2 normal.  Exam reveals no gallop and no friction rub.   No murmur heard. Pulmonary/Chest: Effort normal and breath sounds normal. No respiratory distress. He exhibits no tenderness.  Abdominal: Soft. Normal appearance and bowel sounds are normal. There is no hepatosplenomegaly. There is no tenderness. There is no rebound, no guarding, no tenderness at McBurney's point and negative Murphy's sign. No hernia.  Musculoskeletal: Normal range of motion.       Lumbar back: He exhibits tenderness.       Back:  Neurological: He is alert and oriented to person, place, and time. He has normal strength. No cranial nerve deficit or sensory deficit. Coordination normal. GCS eye subscore is 4. GCS verbal subscore is 5. GCS motor subscore is 6.  Normal strength and sensation in both lower extremities.  Right  leg raises bilaterally cause pain in the back without radicular pain  Skin: Skin is warm, dry and intact. No rash noted. No cyanosis.  Psychiatric: He has a normal mood and affect. His speech is normal and behavior is normal. Thought content normal.  Nursing note and vitals reviewed.   ED Course  Procedures (including critical care time) Labs Review Labs Reviewed  LIPASE, BLOOD - Abnormal; Notable for the following:    Lipase 52 (*)    All other components within normal limits  PROTIME-INR - Abnormal; Notable for the following:    Prothrombin Time 17.2 (*)    All other components within normal limits  GLUCOSE, CAPILLARY  DRUG SCREEN 10 W/CONF, SERUM  PROTIME-INR  CBC  COMPREHENSIVE METABOLIC PANEL    Imaging Review Dg Abd Acute W/chest  10/29/2015  CLINICAL DATA:  Nausea and vomiting for several days. EXAM: DG ABDOMEN ACUTE W/ 1V CHEST COMPARISON:  10/19/2015 FINDINGS: There is a right jugular central line with tip in the low SVC. There is moderate unchanged cardiomegaly. Mild chronic appearing interstitial coarsening is present, unchanged. No consolidation. No large effusion. The abdominal gas pattern is negative for obstruction or perforation. There is a generous volume of stool throughout the colon. No biliary or urinary calculi are evident. IMPRESSION: Negative abdominal radiographs. No acute cardiopulmonary disease. Unchanged cardiomegaly. Electronically Signed   By: Andreas Newport M.D.   On: 10/29/2015 03:15   Ct Renal Stone Study  10/30/2015  CLINICAL DATA:  Back pain. Nausea vomiting 2 days. Dialysis patient. EXAM: CT ABDOMEN AND PELVIS WITHOUT CONTRAST TECHNIQUE: Multidetector CT imaging of the abdomen and pelvis was performed following the standard protocol without IV contrast. COMPARISON:  CT abdomen pelvis 03/11/2015 FINDINGS: Lower chest: Cardiac enlargement with moderate pericardial effusion. Lung bases clear Hepatobiliary: The liver is mildly enlarged. No focal liver  lesion. Gallbladder and bile ducts normal. Pancreas: Negative Spleen: Negative Adrenals/Urinary Tract: Left midpole renal mass measures 39 x 42 mm and shows significant interval growth since the prior study. Limited evaluation without intravenous contrast however this appears to be a solid lesion and is likely renal cell carcinoma. Atrophy of both kidneys without hydronephrosis. No renal calculi. Urinary bladder is collapsed. Renal transplant in the left pelvis unchanged from the prior study. Limited  evaluation without intravenous contrast. Stomach/Bowel: Negative for bowel obstruction. No bowel edema or mass. Appendix not well seen but no evidence of appendicitis. Negative for diverticulitis. Vascular/Lymphatic: Advanced atherosclerotic calcification in the aorta and iliac arteries. No aneurysm. No lymphadenopathy. Reproductive: Negative Other: Negative for ascites. Musculoskeletal: Negative IMPRESSION: Left renal mass has enlarged since the prior CT and is consistent with carcinoma. Renal transplant left lower quadrant with limited evaluation without intravenous contrast. Moderate pericardial effusion Electronically Signed   By: Franchot Gallo M.D.   On: 10/30/2015 07:22   Ct Renal Abd W/wo  10/30/2015  CLINICAL DATA:  Left renal mass, abdominal pain for 2 days with nausea and vomiting. EXAM: CT ABDOMEN AND PELVIS WITHOUT AND WITH CONTRAST TECHNIQUE: Multidetector CT imaging of the abdomen and pelvis was performed following the standard protocol before and following the bolus administration of intravenous contrast. CONTRAST:  100 cc Isovue-300. COMPARISON:  10/30/2015 and 03/11/2015. FINDINGS: Lower chest: Lung bases show no acute findings. Heart is moderately enlarged. Small pericardial effusion. No pericardial effusion. Hepatobiliary: Liver is heterogeneous. Gallbladder is unremarkable. No biliary ductal dilatation. Pancreas: Negative. Spleen: Negative. Adrenals/Urinary Tract: Adrenal glands are unremarkable.  Native kidneys are atrophic. Heterogeneous mass off the interpolar left kidney measures 3.5 cm (401/54), previously 1.9 cm on 03/11/2015. It measures 33 Hounsfield units on precontrast imaging and 53 Hounsfield units on nephrographic phase imaging. Enhancement characteristics are most likely stunted by renal failure. Additional low-attenuation lesions in the kidneys measure up to 1.4 cm on the right, too small to characterize. Left renal transplant is partially imaged in the left iliac fossa. Stomach/Bowel: Stomach and visualized portions of the small bowel and colon are grossly unremarkable. Vascular/Lymphatic: Atherosclerotic calcification of the arterial vasculature without abdominal aortic aneurysm. Scattered lymph nodes are not enlarged by CT size criteria. Other: No free fluid. Mesenteries and peritoneum are grossly unremarkable. Musculoskeletal: No worrisome lytic or sclerotic lesions. IMPRESSION: 1. Enlarging left renal mass with mild enhancement, worrisome for renal cell carcinoma. Please see discussion above. 2. Small pericardial effusion. 3. Heterogeneity within the liver parenchyma may be due to passive congestion related to impaired cardiac function. Electronically Signed   By: Lorin Picket M.D.   On: 10/30/2015 11:54   I have personally reviewed and evaluated these images and lab results as part of my medical decision-making.   EKG Interpretation None      MDM   Final diagnoses:  Bilateral low back pain without sciatica  Nausea and vomiting, vomiting of unspecified type  Pericardial effusion  Renal mass    Patient presents to the ER for evaluation of low back pain and vomiting. Patient was seen by myself overnight for similar complaints. Patient had full laboratory workup and there were no acute abnormalities. Patient does receive dialysis, but he did not have any evidence of hyperkalemia. Patient was treated with analgesia while he was here in the ER and reported improvement,  discharged to home. He reports that after he got home he started having increased pain and he has been vomiting multiple times.  Patient did have a slightly elevated lipase at his previous visit. This was felt to be nonspecific. He was not experiencing abdominal pain. Repeat lipase is now 52, not felt to be consistent with acute pancreatitis. Patient declined IV fluids despite reporting vomiting every 15 minutes all night long. He reports that he is a dialysis patient and did not want to take any fluids for this. Patient was administered medicine for nausea and vomiting and this has improved.  noncontrast CT scan was performed to rule out catastrophic mouth causing the patient's back pain, such as AAA, etc. CT scan shows enlarging renal mass that is consistent with carcinoma. I discussed this with the patient. He was told that he had a mass in the past and was told that he needed surgery, but he had "a lot of things going on" at that time and did not follow-up.  Patient also has moderate pericardial effusion. Reviewing his records reveals an echo from 2 months ago that showed a small pericardial effusion. This will need further evaluation. Patient does not have any tamponade physiology.  Patient still complaining of moderate pain and nausea. As he has been seen twice in the ER for this overnight and is still feeling symptomatic, requires further workup for pericardial effusion, will ask hospitalist to admit the patient.   Orpah Greek, MD 10/30/15 3009  Orpah Greek, MD 10/30/15 [phone]

## 2015-10-30 NOTE — Consult Note (Addendum)
Urology Consult   Physician requesting consult: Linna Darner  Reason for consult: Enlarging renal mass  History of Present Illness: Frank [name] is a 50 y.o. male with PMH significant for ESRD on HD TThSat, HTN, CHF, and DM II who was admitted on 10/30/15 for eval of abdominal pain with N/V and low back pain.  CT scan revealed a 3.5cm left renal mass that has increased in size over the past year (1.9cm 03/11/15) and is worrisome for RCC.  There is atrophy of both kidneys with no hydro or stones noted.  He is aneuric.  Pt states he was told about the left renal lesion last year at the time of the scan and was subsequently evaled by a urologist in [facility].  Pt states he was told that the lesion could be followed but he has not returned for additional evaluation.    Pt started peritoneal dialysis over 14 years ago and then had a kidney transplant that worked for approx 8 years. After the transplant failed he began hemodialysis.  He denies any other GU hx.   He is currently resting and his only complaint is of back pain.  He denies F/C, HA, CP, SOB, N/V, diarrhea/constipation.    Past Medical History  Diagnosis Date  . Hypertension   . Depression   . Complication of anesthesia     itching, sore throat  . Shortness of breath   . Anxiety   . CHF (congestive heart failure) ([place])   . Anemia   . Dialysis patient ([place])   . Type II diabetes mellitus ([facility])     No history per patient, but remains under history as A1c would not be accurate given on dialysis  . ESRD (end stage renal disease) ([place])     due to HTN per patient, followed at [facility], s/p failed kidney transplant - dialysis Tue, Th, Sat  . Renal insufficiency     Past Surgical History  Procedure Laterality Date  . Kidney receipient  2006    failed and started HD in March 2014  . Capd insertion    . Capd removal    . Left heart catheterization with coronary angiogram N/A 09/02/2014    Procedure: LEFT HEART CATHETERIZATION WITH  CORONARY ANGIOGRAM;  Surgeon: [name], MD;  Location: [facility] CATH LAB;  Service: Cardiovascular;  Laterality: N/A;  . Inguinal hernia repair Right 02/14/2015    Procedure: REPAIR INCARCERATED RIGHT INGUINAL HERNIA;  Surgeon: [name], MD;  Location: [place];  Service: General;  Laterality: Right;  . Insertion of dialysis catheter Right 09/23/2015    Procedure: exchange of Right internal Dialysis Catheter.;  Surgeon: [name], MD;  Location: [place];  Service: Vascular;  Laterality: Right;    Current Hospital Medications:  Home Meds:    Medication List    ASK your doctor about these medications        ALPRAZolam 0.5 MG tablet  Commonly known as:  XANAX  Take 1 tablet (0.5 mg total) by mouth 2 (two) times daily as needed for anxiety.     amLODipine 10 MG tablet  Commonly known as:  NORVASC  Take 10 mg by mouth daily.     atorvastatin 40 MG tablet  Commonly known as:  LIPITOR  Take 1 tablet (40 mg total) by mouth daily at 6 PM.     carvedilol 6.25 MG tablet  Commonly known as:  COREG  Take 1 tablet (6.25 mg total) by mouth 2 (two) times  daily with a meal.     cinacalcet 30 MG tablet  Commonly known as:  SENSIPAR  Take 30 mg by mouth daily.     cyclobenzaprine 10 MG tablet  Commonly known as:  FLEXERIL  Take 1 tablet (10 mg total) by mouth 3 (three) times daily as needed for muscle spasms.     docusate sodium 100 MG capsule  Commonly known as:  COLACE  Take 1 capsule (100 mg total) by mouth 2 (two) times daily.     doxercalciferol 4 MCG/2ML injection  Commonly known as:  HECTOROL  Inject 1.25 mLs (2.5 mcg total) into the vein Every Tuesday,Thursday,and Saturday with dialysis.     gabapentin 100 MG capsule  Commonly known as:  NEURONTIN  Take 100 mg by mouth 3 (three) times daily.     hydrALAZINE 50 MG tablet  Commonly known as:  APRESOLINE  Take 2 tablets (100 mg total) by mouth every 8 (eight) hours.     hydrocerin Crea  Apply 1 application topically 2  (two) times daily.     HYDROcodone-acetaminophen 5-325 MG tablet  Commonly known as:  NORCO/VICODIN  Take 1 tablet by mouth every 6 (six) hours as needed.     hydrOXYzine 10 MG tablet  Commonly known as:  ATARAX/VISTARIL  Take 1 tablet (10 mg total) by mouth 3 (three) times daily as needed for itching.     ibuprofen 600 MG tablet  Commonly known as:  ADVIL,MOTRIN  Take 1 tablet (600 mg total) by mouth every 8 (eight) hours as needed.     isosorbide mononitrate 30 MG 24 hr tablet  Commonly known as:  IMDUR  Take 3 tablets (90 mg total) by mouth daily.     levalbuterol 45 MCG/ACT inhaler  Commonly known as:  XOPENEX HFA  Inhale 2 puffs into the lungs every 6 (six) hours as needed for wheezing or shortness of breath.     lisinopril 20 MG tablet  Commonly known as:  PRINIVIL,ZESTRIL  Take 1 tablet (20 mg total) by mouth daily.     methocarbamol 500 MG tablet  Commonly known as:  ROBAXIN  Take 1 tablet (500 mg total) by mouth every 8 (eight) hours as needed for muscle spasms.     multivitamin Tabs tablet  Take 1 tablet by mouth at bedtime.     omeprazole 20 MG capsule  Commonly known as:  PRILOSEC  Take 20 mg by mouth daily.     ondansetron 4 MG tablet  Commonly known as:  ZOFRAN  Take 1 tablet (4 mg total) by mouth every 6 (six) hours.     predniSONE 5 MG tablet  Commonly known as:  DELTASONE  Take 5 mg by mouth daily with breakfast.     sevelamer carbonate 800 MG tablet  Commonly known as:  RENVELA  Take 800-1,600 mg by mouth 3 (three) times daily with meals. 2 tabs three times daily with meals, and 1 tablet with snacks     traMADol 50 MG tablet  Commonly known as:  ULTRAM  Take 1 tablet (50 mg total) by mouth every 6 (six) hours as needed.     warfarin 6 MG tablet  Commonly known as:  COUMADIN  Take 1.5 tablets (9 mg total) by mouth daily at 6 PM. Take daily until adjusted per MD        Scheduled Meds: . amLODipine  10 mg Oral Daily  . atorvastatin  40 mg  Oral q1800  . bisacodyl  10 mg Rectal Once  . carvedilol  6.25 mg Oral BID WC  . cinacalcet  30 mg Oral Q lunch  . docusate sodium  100 mg Oral BID  . [START ON 10/31/2015] doxercalciferol  2.5 mcg Intravenous Q T,Th,Sa-HD  . folic acid  1 mg Oral Daily  . gabapentin  100 mg Oral TID  . hydrALAZINE  100 mg Oral 3 times per day  . isosorbide mononitrate  90 mg Oral Daily  . lisinopril  20 mg Oral Daily  . multivitamin  1 tablet Oral QHS  . multivitamin with minerals  1 tablet Oral Daily  . pantoprazole  40 mg Oral Daily  . [START ON 10/31/2015] predniSONE  5 mg Oral Q breakfast  . sevelamer carbonate  1,600 mg Oral TID WC  . sodium chloride flush  3 mL Intravenous Q12H  . thiamine  100 mg Oral Daily   Or  . thiamine  100 mg Intravenous Daily  . warfarin  10 mg Oral ONCE-1800  . Warfarin - Pharmacist Dosing Inpatient   Does not apply q1800   Continuous Infusions:  PRN Meds:.acetaminophen **OR** acetaminophen, ALPRAZolam, HYDROcodone-acetaminophen, HYDROmorphone (DILAUDID) injection, hydrOXYzine, labetalol, levalbuterol, LORazepam **OR** LORazepam, ondansetron **OR** ondansetron (ZOFRAN) IV, sevelamer carbonate  Allergies:  Allergies  Allergen Reactions  . Butalbital-Apap-Caffeine Shortness Of Breath and Swelling    Swelling in throat  . Ferrlecit [Na Ferric Gluc Cplx In Sucrose] Shortness Of Breath, Swelling and Other (See Comments)    Swelling in throat  . Minoxidil Shortness Of Breath  . Darvocet [Propoxyphene N-Acetaminophen] Hives    Family History  Problem Relation Age of Onset  . Hypertension Other     Social History:  reports that he has quit smoking. His smoking use included Cigarettes. He smoked 0.00 packs per day for 1 year. He has never used smokeless tobacco. He reports that he does not drink alcohol or use illicit drugs.  ROS: A complete review of systems was performed.  All systems are negative except for pertinent findings as noted.  Physical Exam:  Vital  signs in last 24 hours: Temp:  [97.5 F (36.4 C)-98.1 F (36.7 C)] 97.8 F (36.6 C) (04/10 1446) Pulse Rate:  [77-91] 77 (04/10 1446) Resp:  [18] 18 (04/10 1446) BP: (138-171)/(96-127) 168/112 mmHg (04/10 1446) SpO2:  [88 %-100 %] 99 % (04/10 1446) Weight:  [73.9 kg (162 lb 14.7 oz)] 73.9 kg (162 lb 14.7 oz) (04/10 0949) Constitutional:  Alert and oriented, No acute distress Cardiovascular: Regular rate and rhythm Respiratory: Normal respiratory effort GI: Abdomen is soft, tender over LUQ, nondistended, no abdominal masses. Gibson incision LLQ. GU: No CVA tenderness. (Pt winces at palpation of left hip, no L CVAT). Lymphatic: No lymphadenopathy Neurologic: Grossly intact, no focal deficits Psychiatric: Normal mood and affect  Laboratory Data:   Recent Labs  10/29/15 0055  WBC 5.4  HGB 9.1*  HCT 27.8*  PLT 151     Recent Labs  10/29/15 0055  NA 138  K 4.7  CL 102  GLUCOSE 96  BUN 30*  CALCIUM 8.8*  CREATININE 7.85*     Results for orders placed or performed during the hospital encounter of 10/30/15 (from the past 24 hour(s))  Lipase, blood     Status: Abnormal   Collection Time: 10/30/15  6:20 AM  Result Value Ref Range   Lipase 52 (H) 11 - 51 U/L  Protime-INR     Status: Abnormal   Collection Time: 10/30/15  7:57 AM  Result  Value Ref Range   Prothrombin Time 17.2 (H) 11.6 - 15.2 seconds   INR 1.40 0.00 - 1.49  Glucose, capillary     Status: None   Collection Time: 10/30/15  9:44 AM  Result Value Ref Range   Glucose-Capillary 78 65 - 99 mg/dL   Comment 1 Notify RN    No results found for this or any previous visit (from the past 240 hour(s)).  Renal Function:  Recent Labs  10/29/15 0055  CREATININE 7.85*   Estimated Creatinine Clearance: 11.6 mL/min (by C-G formula based on Cr of 7.85).  Radiologic Imaging: Dg Abd Acute W/chest  10/29/2015  CLINICAL DATA:  Nausea and vomiting for several days. EXAM: DG ABDOMEN ACUTE W/ 1V CHEST COMPARISON:   10/19/2015 FINDINGS: There is a right jugular central line with tip in the low SVC. There is moderate unchanged cardiomegaly. Mild chronic appearing interstitial coarsening is present, unchanged. No consolidation. No large effusion. The abdominal gas pattern is negative for obstruction or perforation. There is a generous volume of stool throughout the colon. No biliary or urinary calculi are evident. IMPRESSION: Negative abdominal radiographs. No acute cardiopulmonary disease. Unchanged cardiomegaly. Electronically Signed   By: Andreas Newport M.D.   On: 10/29/2015 03:15   Ct Renal Stone Study  10/30/2015  CLINICAL DATA:  Back pain. Nausea vomiting 2 days. Dialysis patient. EXAM: CT ABDOMEN AND PELVIS WITHOUT CONTRAST TECHNIQUE: Multidetector CT imaging of the abdomen and pelvis was performed following the standard protocol without IV contrast. COMPARISON:  CT abdomen pelvis 03/11/2015 FINDINGS: Lower chest: Cardiac enlargement with moderate pericardial effusion. Lung bases clear Hepatobiliary: The liver is mildly enlarged. No focal liver lesion. Gallbladder and bile ducts normal. Pancreas: Negative Spleen: Negative Adrenals/Urinary Tract: Left midpole renal mass measures 39 x 42 mm and shows significant interval growth since the prior study. Limited evaluation without intravenous contrast however this appears to be a solid lesion and is likely renal cell carcinoma. Atrophy of both kidneys without hydronephrosis. No renal calculi. Urinary bladder is collapsed. Renal transplant in the left pelvis unchanged from the prior study. Limited evaluation without intravenous contrast. Stomach/Bowel: Negative for bowel obstruction. No bowel edema or mass. Appendix not well seen but no evidence of appendicitis. Negative for diverticulitis. Vascular/Lymphatic: Advanced atherosclerotic calcification in the aorta and iliac arteries. No aneurysm. No lymphadenopathy. Reproductive: Negative Other: Negative for ascites.  Musculoskeletal: Negative IMPRESSION: Left renal mass has enlarged since the prior CT and is consistent with carcinoma. Renal transplant left lower quadrant with limited evaluation without intravenous contrast. Moderate pericardial effusion Electronically Signed   By: Franchot Gallo M.D.   On: 10/30/2015 07:22   Ct Renal Abd W/wo  10/30/2015  CLINICAL DATA:  Left renal mass, abdominal pain for 2 days with nausea and vomiting. EXAM: CT ABDOMEN AND PELVIS WITHOUT AND WITH CONTRAST TECHNIQUE: Multidetector CT imaging of the abdomen and pelvis was performed following the standard protocol before and following the bolus administration of intravenous contrast. CONTRAST:  100 cc Isovue-300. COMPARISON:  10/30/2015 and 03/11/2015. FINDINGS: Lower chest: Lung bases show no acute findings. Heart is moderately enlarged. Small pericardial effusion. No pericardial effusion. Hepatobiliary: Liver is heterogeneous. Gallbladder is unremarkable. No biliary ductal dilatation. Pancreas: Negative. Spleen: Negative. Adrenals/Urinary Tract: Adrenal glands are unremarkable. Native kidneys are atrophic. Heterogeneous mass off the interpolar left kidney measures 3.5 cm (401/54), previously 1.9 cm on 03/11/2015. It measures 33 Hounsfield units on precontrast imaging and 53 Hounsfield units on nephrographic phase imaging. Enhancement characteristics are most likely stunted  by renal failure. Additional low-attenuation lesions in the kidneys measure up to 1.4 cm on the right, too small to characterize. Left renal transplant is partially imaged in the left iliac fossa. Stomach/Bowel: Stomach and visualized portions of the small bowel and colon are grossly unremarkable. Vascular/Lymphatic: Atherosclerotic calcification of the arterial vasculature without abdominal aortic aneurysm. Scattered lymph nodes are not enlarged by CT size criteria. Other: No free fluid. Mesenteries and peritoneum are grossly unremarkable. Musculoskeletal: No worrisome  lytic or sclerotic lesions. IMPRESSION: 1. Enlarging left renal mass with mild enhancement, worrisome for renal cell carcinoma. Please see discussion above. 2. Small pericardial effusion. 3. Heterogeneity within the liver parenchyma may be due to passive congestion related to impaired cardiac function. Electronically Signed   By: Lorin Picket M.D.   On: 10/30/2015 11:54    Above imaging studies were personally reviewed Impression/Recommendation  Left renal mass--enlarged over the past year and is suspicious for renal cell carcinoma. Pt will likely need radical nephrectomy as the kidney is atrophic and non functioning. Dr. Diona Fanti will review the CT scans and see the pt later today.   Pt interviewed/examined. I agree with above assessment. I do not think the mass is symptomatic at this time, but I agree that nonemergent left nephrectomy is called for. I have recommmended that pt see [place] in our [place] office to schedule eventual lap nephrectomy.  DANCY, AMANDA 10/30/2015, 3:50 PM

## 2015-10-30 NOTE — Procedures (Signed)
Radiologist approved for patient to receive contrast since patient is being admitted.

## 2015-10-30 NOTE — H&P (Signed)
[facility] History and Physical                                                                                    Frank [name], is a 50 y.o. male  MRN: [id number]   DOB - May 22, 1966  Admit Date - 10/30/2015  Outpatient Primary MD for the patient is [name] Patient  Referring MD: Betsey Holiday / ER  Consulting M.D: Moshe Cipro / Renal  PMH: Past Medical History  Diagnosis Date  . Hypertension   . Depression   . Complication of anesthesia     itching, sore throat  . Shortness of breath   . Anxiety   . CHF (congestive heart failure) ([place])   . Anemia   . Dialysis patient ([place])   . Type II diabetes mellitus (HCC)     No history per patient, but remains under history as A1c would not be accurate given on dialysis  . ESRD (end stage renal disease) ([place])     due to HTN per patient, followed at [facility], s/p failed kidney transplant - dialysis Tue, Th, Sat  . Renal insufficiency       PSH: Past Surgical History  Procedure Laterality Date  . Kidney receipient  2006    failed and started HD in March 2014  . Capd insertion    . Capd removal    . Left heart catheterization with coronary angiogram N/A 09/02/2014    Procedure: LEFT HEART CATHETERIZATION WITH CORONARY ANGIOGRAM;  Surgeon: [name], MD;  Location: [facility] CATH LAB;  Service: Cardiovascular;  Laterality: N/A;  . Inguinal hernia repair Right 02/14/2015    Procedure: REPAIR INCARCERATED RIGHT INGUINAL HERNIA;  Surgeon: [name], MD;  Location: [facility];  Service: General;  Laterality: Right;  . Insertion of dialysis catheter Right 09/23/2015    Procedure: exchange of Right internal Dialysis Catheter.;  Surgeon: [name], MD;  Location: [facility];  Service: Vascular;  Laterality: Right;     CC:  Chief Complaint  Patient presents with  . Nausea  . Back Pain     HPI: 50 year old male patient who presents to the ER frequently and is typically hospitalized at this facility about once a month. He recently left AMA  on 3/28 after an admission for nonspecific chest pain. Since that time he has been evaluated in the ER 5 times (including for this admission): He was evaluated on 3/30 for medical clearance prior to incarceration, he presented in police custody; on 9/73 his blood pressure was markedly elevated at 199/136 and once again had been brought in by the police for medical clearance prior to re-incarceration; back again on 4/1 apparently for dialysis while incarcerated; he presented yesterday on 4/9 with abdominal pain and workup was unrevealing for anything significant that would require admission so he was discharged from the ER. He returned this morning on 4/10 with complaints of nausea and back pain noting he had been given analgesia the previous evening and had approved. He reports that after returning home he began having nausea and vomiting and now has developed low back pain. Patient has been having intermittent low back pain for the past  24 hours but has become significant. He has vomited nonbilious material several times in the past 24 hours as well. He has not had any fevers or chills. He is having mild anterior abdominal pain nonspecified and upon reviewing her chart this is noted to be chronic in nature. He denies missing any dialysis sessions or signing out dialysis early.  ER Evaluation and treatment: Initial temp 98.1-BP 149/97-pulse 78-respirations 18-room air saturations 99% Repeat vital signs BP now up to 171/118 with a pulse of 91 AAS: No obstruction or perforation but significant volume of stool throughout the colon CT renal stone study: Left renal mass has enlarged since prior CT and appears consistent with carcinoma; renal transplant left lower quadrant limited evaluation without IV contrast; moderate pericardial effusion  Lab data: Na 138, K 4.7, BUN 30, Cr 7.85,  lipase originally 89 now down to 52, AST 52 ALT 14, WBC 5400 noted differential obtained, hemoglobin 9.1 which is consistent with  previous readings of hemoglobin between 8.5 and 9.2, PTT 28.5, INR 2.72, PTT 48 Reglan 10 mg IV 1 Benadryl 25 mg IV 1 Dilaudid 1 mg IV 1  Review of Systems   In addition to the HPI above,  No Fever-chills, myalgias or other constitutional symptoms No Headache, changes with Vision or hearing, new weakness, tingling, numbness in any extremity, No problems swallowing food or Liquids, indigestion/reflux No Chest pain, Cough or Shortness of Breath, palpitations, orthopnea or DOE No melena or hematochezia, no dark tarry stools No dysuria, hematuria or flank pain No new skin rashes, lesions, masses or bruises, No new joints pains-aches No recent weight gain or loss No polyuria, polydypsia or polyphagia,  *A full 10 point Review of Systems was done, except as stated above, all other Review of Systems were negative.  Social History Social History  Substance Use Topics  . Smoking status: Former Smoker -- 0.00 packs/day for 1 years    Types: Cigarettes  . Smokeless tobacco: Never Used     Comment: quit Jan 2014  . Alcohol Use: No    Resides at: Private residence  Lives with: Alone  Ambulatory status: Without assistive devices   Family History Family History  Problem Relation Age of Onset  . Hypertension Other      Prior to Admission medications   Medication Sig Start Date End Date Taking? Authorizing Provider  ALPRAZolam [name]) 0.5 MG tablet Take 1 tablet (0.5 mg total) by mouth 2 (two) times daily as needed for anxiety. 09/11/15  Yes [name], MD  amLODipine (NORVASC) 10 MG tablet Take 10 mg by mouth daily.   Yes Historical Provider, MD  atorvastatin (LIPITOR) 40 MG tablet Take 1 tablet (40 mg total) by mouth daily at 6 PM. 09/02/14  Yes Barton Dubois, MD  carvedilol (COREG) 6.25 MG tablet Take 1 tablet (6.25 mg total) by mouth 2 (two) times daily with a meal. 09/27/15  Yes [name], MD  cinacalcet (SENSIPAR) 30 MG tablet Take 30 mg by mouth daily.   Yes  Historical Provider, MD  docusate sodium (COLACE) 100 MG capsule Take 1 capsule (100 mg total) by mouth 2 (two) times daily. 11/19/14  Yes [name], MD  doxercalciferol (HECTOROL) 4 MCG/2ML injection Inject 1.25 mLs (2.5 mcg total) into the vein Every Tuesday,Thursday,and Saturday with dialysis. 10/10/14  Yes [name], DO  gabapentin (NEURONTIN) 100 MG capsule Take 100 mg by mouth 3 (three) times daily. 06/14/15 10/30/15 Yes Historical Provider, MD  hydrALAZINE (APRESOLINE) 50 MG  tablet Take 2 tablets (100 mg total) by mouth every 8 (eight) hours. 09/11/15  Yes [name], MD  hydrocerin (EUCERIN) CREA Apply 1 application topically 2 (two) times daily. 02/04/15  Yes [name], MD  HYDROcodone-acetaminophen (NORCO/VICODIN) 5-325 MG tablet Take 1 tablet by mouth every 6 (six) hours as needed. Patient taking differently: Take 1 tablet by mouth every 6 (six) hours as needed for moderate pain.  09/27/15  Yes [name], MD  hydrOXYzine (ATARAX/VISTARIL) 10 MG tablet Take 1 tablet (10 mg total) by mouth 3 (three) times daily as needed for itching. 09/27/15  Yes [name], MD  ibuprofen (ADVIL,MOTRIN) 600 MG tablet Take 1 tablet (600 mg total) by mouth every 8 (eight) hours as needed. 10/06/15  Yes [name], MD  isosorbide mononitrate (IMDUR) 30 MG 24 hr tablet Take 3 tablets (90 mg total) by mouth daily. 09/11/15  Yes [name], MD  levalbuterol [facility] HFA) 45 MCG/ACT inhaler Inhale 2 puffs into the lungs every 6 (six) hours as needed for wheezing or shortness of breath. 11/19/14  Yes [name], MD  lisinopril (PRINIVIL,ZESTRIL) 20 MG tablet Take 1 tablet (20 mg total) by mouth daily. 02/04/15  Yes [name], MD  methocarbamol (ROBAXIN) 500 MG tablet Take 1 tablet (500 mg total) by mouth every 8 (eight) hours as needed for muscle spasms. 10/06/15  Yes [name], MD  multivitamin (RENA-VIT) TABS tablet Take 1 tablet by mouth at bedtime. 02/04/15  Yes [name], MD  omeprazole (PRILOSEC) 20 MG capsule Take 20 mg by mouth daily. 07/01/13  Yes Historical Provider, MD  predniSONE (DELTASONE) 5 MG tablet Take 5 mg by mouth daily with breakfast.   Yes Historical Provider, MD  sevelamer carbonate (RENVELA) 800 MG tablet Take 800-1,600 mg by mouth 3 (three) times daily with meals. 2 tabs three times daily with meals, and 1 tablet with snacks   Yes Historical Provider, MD  warfarin (COUMADIN) 6 MG tablet Take 1.5 tablets (9 mg total) by mouth daily at 6 PM. Take daily until adjusted per MD 09/12/15  Yes [name], MD  cyclobenzaprine (FLEXERIL) 10 MG tablet Take 1 tablet (10 mg total) by mouth 3 (three) times daily as needed for muscle spasms. 10/29/15   Orpah Greek, MD  ondansetron (ZOFRAN) 4 MG tablet Take 1 tablet (4 mg total) by mouth every 6 (six) hours. 10/29/15   Orpah Greek, MD  traMADol (ULTRAM) 50 MG tablet Take 1 tablet (50 mg total) by mouth every 6 (six) hours as needed. 10/29/15   Orpah Greek, MD    Allergies  Allergen Reactions  . Butalbital-Apap-Caffeine Shortness Of Breath and Swelling    Swelling in throat  . Ferrlecit [Na Ferric Gluc Cplx In Sucrose] Shortness Of Breath, Swelling and Other (See Comments)    Swelling in throat  . Minoxidil Shortness Of Breath  . Darvocet [Propoxyphene N-Acetaminophen] Hives    Physical Exam  Vitals  Blood pressure 171/118, pulse 91, temperature 98.1 F (36.7 C), temperature source Oral, resp. rate 18, SpO2 100 %.   General:  In no acute distress, appears Chronically ill and malnourished  Psych:  Normal affect, Denies Suicidal or Homicidal ideations, Awake Alert, Oriented X 3. Speech and thought patterns are clear and appropriate, no apparent short term memory deficits  Neuro:   No focal neurological deficits, CN II through XII intact, Strength 5/5 all 4 extremities, Sensation intact all 4 extremities.  ENT:  Ears and Eyes appear Normal, Conjunctivae clear, PER.  Moist oral mucosa without erythema or exudates.  Neck:  Supple, No lymphadenopathy appreciated  Respiratory:  Symmetrical chest wall movement, Good air movement bilaterally, CTAB. Room Air  Cardiac:  RRR, No Murmurs or rubs, no LE edema noted, no JVD, No carotid bruits, peripheral pulses palpable at 2+  Abdomen:  Positive bowel sounds, Soft, somewhat diffusely tender out guarding or rebounding, Non distended,  No masses appreciated, no obvious hepatosplenomegaly  Genitourinary: Positive CVAT on left  Skin:  No Cyanosis, Normal Skin Turgor, No Skin Rash or Bruise.  Extremities: Symmetrical without obvious trauma or injury,  no effusions.  Data Review  CBC  Recent Labs Lab 10/29/15 0055  WBC 5.4  HGB 9.1*  HCT 27.8*  PLT 151  MCV 89.7  MCH 29.4  MCHC 32.7  RDW 16.7*    Chemistries   Recent Labs Lab 10/29/15 0055  NA 138  K 4.7  CL 102  CO2 23  GLUCOSE 96  BUN 30*  CREATININE 7.85*  CALCIUM 8.8*  AST 52*  ALT 14*  ALKPHOS 112  BILITOT 0.7    estimated creatinine clearance is 11.6 mL/min (by C-G formula based on Cr of 7.85).  No results for input(s): TSH, T4TOTAL, T3FREE, THYROIDAB in the last 72 hours.  Invalid input(s): FREET3  Coagulation profile No results for input(s): INR, PROTIME in the last 168 hours.  No results for input(s): DDIMER in the last 72 hours.  Cardiac Enzymes No results for input(s): CKMB, TROPONINI, MYOGLOBIN in the last 168 hours.  Invalid input(s): CK  Invalid input(s): POCBNP  Urinalysis    Component Value Date/Time   COLORURINE YELLOW 10/18/2013 [place] 10/18/2013 0419   LABSPEC 1.008 10/18/2013 0419   PHURINE 8.5* 10/18/2013 0419   GLUCOSEU 100* 10/18/2013 0419   HGBUR TRACE* 10/18/2013 0419   BILIRUBINUR NEGATIVE 10/18/2013 0419   KETONESUR NEGATIVE 10/18/2013 0419   PROTEINUR 100* 10/18/2013 0419   UROBILINOGEN 0.2 10/18/2013 0419   NITRITE NEGATIVE 10/18/2013 0419   LEUKOCYTESUR NEGATIVE  10/18/2013 0419    Imaging results:   Dg Chest 2 View  10/19/2015  CLINICAL DATA:  HTN, ESRD, dialysis patient, Type II diabetes, CHF EXAM: CHEST  2 VIEW COMPARISON:  10/16/2015 FINDINGS: Mild to moderate enlargement of the cardiopericardial silhouette, stable. No mediastinal or hilar masses or evidence of adenopathy. Right internal jugular tunneled dual lumen central venous catheter is stable with its tip in the lower superior vena cava just above the caval atrial junction. Lungs are essentially clear. No evidence of pulmonary edema or pneumonia. No pleural effusion or pneumothorax. Skeletal structures are unremarkable. IMPRESSION: 1. No acute cardiopulmonary disease. Electronically Signed   By: Lajean Manes M.D.   On: 10/19/2015 21:37   Dg Chest 2 View  10/16/2015  CLINICAL DATA:  Chest pain with shortness of breath. Dialysis 3 days ago. Recent hospitalization. EXAM: CHEST  2 VIEW COMPARISON:  10/06/2015 and 09/23/2015. FINDINGS: 1546 hours. Right IJ dialysis catheters are unchanged at the SVC right atrial level. The mediastinal contours and cardiomegaly appear unchanged. There is mild chronic vascular congestion without overt pulmonary edema, confluent airspace opacity or significant pleural effusion. The bones appear unchanged. IMPRESSION: Cardiomegaly with chronic vascular congestion. No edema or other definite acute findings. Electronically Signed   By: Richardean Sale M.D.   On: 10/16/2015 15:58   Dg Chest 2 View  10/06/2015  CLINICAL DATA:  Acute onset of mid back pain  and generalized weakness. Shortness of breath, nausea and vomiting. Initial encounter. EXAM: CHEST  2 VIEW COMPARISON:  Chest radiograph performed 09/23/2015 FINDINGS: The lungs are well-aerated. Vascular congestion is noted. Mildly increased interstitial markings could reflect minimal interstitial edema. There is no evidence of pleural effusion or pneumothorax. The heart is enlarged. A right-sided dual-lumen catheter is noted  ending about the cavoatrial junction. No acute osseous abnormalities are seen. IMPRESSION: Vascular congestion and cardiomegaly. Mildly increased interstitial markings could reflect minimal interstitial edema. Electronically Signed   By: Garald Balding M.D.   On: 10/06/2015 03:04   Dg Abd Acute W/chest  10/29/2015  CLINICAL DATA:  Nausea and vomiting for several days. EXAM: DG ABDOMEN ACUTE W/ 1V CHEST COMPARISON:  10/19/2015 FINDINGS: There is a right jugular central line with tip in the low SVC. There is moderate unchanged cardiomegaly. Mild chronic appearing interstitial coarsening is present, unchanged. No consolidation. No large effusion. The abdominal gas pattern is negative for obstruction or perforation. There is a generous volume of stool throughout the colon. No biliary or urinary calculi are evident. IMPRESSION: Negative abdominal radiographs. No acute cardiopulmonary disease. Unchanged cardiomegaly. Electronically Signed   By: Andreas Newport M.D.   On: 10/29/2015 03:15   Ct Renal Stone Study  10/30/2015  CLINICAL DATA:  Back pain. Nausea vomiting 2 days. Dialysis patient. EXAM: CT ABDOMEN AND PELVIS WITHOUT CONTRAST TECHNIQUE: Multidetector CT imaging of the abdomen and pelvis was performed following the standard protocol without IV contrast. COMPARISON:  CT abdomen pelvis 03/11/2015 FINDINGS: Lower chest: Cardiac enlargement with moderate pericardial effusion. Lung bases clear Hepatobiliary: The liver is mildly enlarged. No focal liver lesion. Gallbladder and bile ducts normal. Pancreas: Negative Spleen: Negative Adrenals/Urinary Tract: Left midpole renal mass measures 39 x 42 mm and shows significant interval growth since the prior study. Limited evaluation without intravenous contrast however this appears to be a solid lesion and is likely renal cell carcinoma. Atrophy of both kidneys without hydronephrosis. No renal calculi. Urinary bladder is collapsed. Renal transplant in the left pelvis  unchanged from the prior study. Limited evaluation without intravenous contrast. Stomach/Bowel: Negative for bowel obstruction. No bowel edema or mass. Appendix not well seen but no evidence of appendicitis. Negative for diverticulitis. Vascular/Lymphatic: Advanced atherosclerotic calcification in the aorta and iliac arteries. No aneurysm. No lymphadenopathy. Reproductive: Negative Other: Negative for ascites. Musculoskeletal: Negative IMPRESSION: Left renal mass has enlarged since the prior CT and is consistent with carcinoma. Renal transplant left lower quadrant with limited evaluation without intravenous contrast. Moderate pericardial effusion Electronically Signed   By: Franchot Gallo M.D.   On: 10/30/2015 07:22     Assessment & Plan  Principal Problem:   Left renal mass -Patient presents with significant left CVAT and CT evidence of enlarging mass concerning for either malignancy or potential acute bleeding and setting of chronic anticoagulation -Admit to telemetry/inpatient -Discussed with radiology appropriate study and have ordered CT abdomen with renal mass protocol with and without contrast since patient has been on dialysis greater than 6 months -Patient states he has known he has had a mass but has not followed up for further investigation/imaging  Active Problems:   Hypertensive urgency -Patient's blood pressure is elevated but he has not received morning medications and this is further influence by back pain -Resume all preadmission medications (Norvasc, carvedilol, hydralazine, and lisinopril)    Pericardial effusion -Defined as moderate on CT scan -I have ordered echocardiogram to better clarify; echocardiogram in February that revealed small circumferential pericardial effusion  measuring 0.8 cm adjacent to the LVN diastole without evidence of tampon physiology -No rub auscultated on exam -Uncertain if uremic in nature, potential bleeding secondary to anticoagulation (although  INR appropriately therapeutic), or if renal mass is truly neoplasm effusion could be malignant in nature    ESRD on dialysis- Tues, Thurs, Sat in [place], nephro: [name] -Nephrology notified of admission -Potassium stable and does not appear to be volume overloaded -Patient reports has had right upper chest permacath in place for 4 years and was due to go this week to see a vascular surgeon Rondall Allegra regarding permanent access placement    Chronic combined systolic and diastolic CHF -Currently compensated without signs of volume overload -Had echocardiogram 09/09/2015 that demonstrated EF 20-25% with grade 2 diastolic dysfunction with biventricular heart failure, pulmonary hypertension 45 mmHg    Drug-seeking behavior -Patient on chronic narcotics and nausea symptoms improved after receipt of analgesia and concern patient's elevated blood pressure and some of his nausea and vomiting could be related to narcotic withdrawal so we'll continue to monitor -Agent denies alcohol but as precaution will order low level CIWA    Constipation -Marked pan colonic constipation seen on plain x-ray -We'll give one-time dose of lactulose by mouth and a Dulcolax suppository and encourage increased water intake today while giving laxatives    Chronic pulmonary embolism  -Pharmacy to manage warfarin -INR therapeutic presentation    Anemia of chronic disease -Hemoglobin stable and at baseline between 8.5 and 9.6    Dyslipidemia -Continue Lipitor    DVT Prophylaxis: Warfarin  Family Communication:  No family at bedside  Code Status:  Full code  Condition:  Stable  Discharge disposition: Once medically stable anticipate discharge back to home  Time spent in minutes : 60      Amanda Pote L. ANP on 10/30/2015 at 8:44 AM  You may contact me by going to [email or web address] - password [email or web address]  I am available from 7a-7p but please confirm I am on the schedule by going to Amion as above.    After 7p please contact night coverage person covering me after hours  [facility]

## 2015-10-30 NOTE — Progress Notes (Signed)
Repeat CT abdomen and pelvis shows a left renal mass that has increased from 1.9 cm August 2016 to 3.5 cm today and concerning for renal cell carcinoma. I spoke with Dr. [place]/urology will see the patient in formal consultation later today  Erin Hearing, ANP

## 2015-10-30 NOTE — Progress Notes (Addendum)
ANTICOAGULATION CONSULT NOTE - Initial Consult  Pharmacy Consult for warfarin Indication: pulmonary embolus  Allergies  Allergen Reactions  . Butalbital-Apap-Caffeine Shortness Of Breath and Swelling    Swelling in throat  . Ferrlecit [Na Ferric Gluc Cplx In Sucrose] Shortness Of Breath, Swelling and Other (See Comments)    Swelling in throat  . Minoxidil Shortness Of Breath  . Darvocet [Propoxyphene N-Acetaminophen] Hives    Patient Measurements:   Heparin Dosing Weight:   Vital Signs: Temp: 98.1 F (36.7 C) (04/10 0523) Temp Source: Oral (04/10 0523) BP: 138/98 mmHg (04/10 0830) Pulse Rate: 88 (04/10 0830)  Labs:  Recent Labs  10/29/15 0055  HGB 9.1*  HCT 27.8*  PLT 151  CREATININE 7.85*    Estimated Creatinine Clearance: 11.6 mL/min (by C-G formula based on Cr of 7.85).   Medical History: Past Medical History  Diagnosis Date  . Hypertension   . Depression   . Complication of anesthesia     itching, sore throat  . Shortness of breath   . Anxiety   . CHF (congestive heart failure) ([place])   . Anemia   . Dialysis patient ([place])   . Type II diabetes mellitus (HCC)     No history per patient, but remains under history as A1c would not be accurate given on dialysis  . ESRD (end stage renal disease) ([place])     due to HTN per patient, followed at [facility], s/p failed kidney transplant - dialysis Tue, Th, Sat  . Renal insufficiency     Medications:  See EMR   Assessment: 50 yo male on warfarin for hx of PE. Currently admitted due to N/V associated with back pain. Last dose taken was on Saturday 4/8. Pt is also ESRD on HD. INR 1.4. PTA warfarin dose: 9 mg po daily  Goal of Therapy:  INR 2-3 Monitor platelets by anticoagulation protocol: Yes    Plan:  -Warfarin 10 mg po x1 -Daily INR   Cerenity Goshorn, Jake Church 10/30/2015,9:00 AM

## 2015-10-31 ENCOUNTER — Inpatient Hospital Stay (HOSPITAL_COMMUNITY): Payer: Medicare Other

## 2015-10-31 DIAGNOSIS — I319 Disease of pericardium, unspecified: Secondary | ICD-10-CM

## 2015-10-31 LAB — COMPREHENSIVE METABOLIC PANEL
ALBUMIN: 2.8 g/dL — AB (ref 3.5–5.0)
ALK PHOS: 92 U/L (ref 38–126)
ALT: 13 U/L — ABNORMAL LOW (ref 17–63)
ANION GAP: 13 (ref 5–15)
AST: 52 U/L — ABNORMAL HIGH (ref 15–41)
BILIRUBIN TOTAL: 0.9 mg/dL (ref 0.3–1.2)
BUN: 58 mg/dL — ABNORMAL HIGH (ref 6–20)
CALCIUM: 8.5 mg/dL — AB (ref 8.9–10.3)
CO2: 21 mmol/L — ABNORMAL LOW (ref 22–32)
Chloride: 99 mmol/L — ABNORMAL LOW (ref 101–111)
Creatinine, Ser: 13 mg/dL — ABNORMAL HIGH (ref 0.61–1.24)
GFR, EST AFRICAN AMERICAN: 4 mL/min — AB (ref >60)
GFR, EST NON AFRICAN AMERICAN: 4 mL/min — AB (ref >60)
GLUCOSE: 80 mg/dL (ref 65–99)
POTASSIUM: 6.2 mmol/L — AB (ref 3.5–5.1)
Sodium: 133 mmol/L — ABNORMAL LOW (ref 135–145)
TOTAL PROTEIN: 7.3 g/dL (ref 6.5–8.1)

## 2015-10-31 LAB — PROTIME-INR
INR: 1.62 — AB (ref 0.00–1.49)
Prothrombin Time: 19.3 seconds — ABNORMAL HIGH (ref 11.6–15.2)

## 2015-10-31 LAB — HEPATITIS B SURFACE ANTIGEN: HEP B S AG: NEGATIVE

## 2015-10-31 LAB — CBC
HEMATOCRIT: 24.6 % — AB (ref 39.0–52.0)
HEMOGLOBIN: 7.8 g/dL — AB (ref 13.0–17.0)
MCH: 27.9 pg (ref 26.0–34.0)
MCHC: 31.7 g/dL (ref 30.0–36.0)
MCV: 87.9 fL (ref 78.0–100.0)
Platelets: 152 10*3/uL (ref 150–400)
RBC: 2.8 MIL/uL — ABNORMAL LOW (ref 4.22–5.81)
RDW: 16.4 % — AB (ref 11.5–15.5)
WBC: 6.1 10*3/uL (ref 4.0–10.5)

## 2015-10-31 LAB — ECHOCARDIOGRAM COMPLETE
Height: 74 in
Weight: 2652.57 oz

## 2015-10-31 MED ORDER — ONDANSETRON HCL 4 MG/2ML IJ SOLN
4.0000 mg | Freq: Once | INTRAMUSCULAR | Status: AC
  Administered 2015-10-31: 4 mg via INTRAVENOUS

## 2015-10-31 MED ORDER — ONDANSETRON HCL 4 MG/2ML IJ SOLN
INTRAMUSCULAR | Status: AC
  Filled 2015-10-31: qty 2

## 2015-10-31 MED ORDER — HYDROMORPHONE HCL 1 MG/ML IJ SOLN
INTRAMUSCULAR | Status: AC
  Filled 2015-10-31: qty 1

## 2015-10-31 MED ORDER — WARFARIN SODIUM 10 MG PO TABS
10.0000 mg | ORAL_TABLET | Freq: Once | ORAL | Status: AC
  Administered 2015-10-31: 10 mg via ORAL
  Filled 2015-10-31: qty 1

## 2015-10-31 NOTE — Procedures (Signed)
Patient was seen on dialysis and the procedure was supervised.  BFR 400  Via PC BP is  high.   Patient appears to be tolerating treatment well- attempting to challenge  Dajon Rowe A 10/31/2015

## 2015-10-31 NOTE — Progress Notes (Signed)
[facility] Progress Note                                            Patient Demographics:    Frank [name], is a 50 y.o. male, DOB - 04-May-1966, CCE:[id number]  Admit date - 10/30/2015   Admitting Physician Waldemar Dickens, MD  Outpatient Primary MD for the patient is [name] Patient  LOS - 1  Chief Complaint  Patient presents with  . Nausea  . Back Pain        Subjective:    continues to have nausea and pain all over especially abd /back   Assessment  & Plan :     Left renal mass -likely RCC, seen by Urology-Dr.Dahlstedt -recommended Outpatient FU for nephrectomy   Non compliance/drug seeking behavior -long h/o this    Nausea/vomiting -suspect uremia related, from long standing under dialysis due to non compliance -HD per Renal -also reportedly was constipated, had a BM yesterday and today -per Staff-no vomiting witnessed although pt reports this, monitor    ESRD on dialysis- Tues, Thurs, Sat in [facility], nephro: [name] -non compliant with HD per Renal    Chronic combined systolic and diastolic CHF -compensated, volume management with HD -Had echocardiogram 09/09/2015 that demonstrated EF 20-25% with grade 2 diastolic dysfunction with biventricular heart failure, pulmonary hypertension 45 mmHg   Chronic pulmonary embolism  -warfarin per pharmacy, INR was subtherapeutic on admission   Anemia of chronic disease -Hb down to 7.8, no overt bleeding -monitor, needs EPO, transfuse if trends down further   Dyslipidemia -Continue Lipitor  DVT Prophylaxis: Warfarin   Code Status : Full Code  Family Communication  : none at bedside  Disposition Plan  : home when improved, 1-2days  Barriers For Discharge :   Consults  : Renal  Lab Results  Component Value Date   PLT 152 10/31/2015    Anti-infectives    None        Objective:    Filed Vitals:   10/31/15 0830 10/31/15 0900 10/31/15 0926 10/31/15 0948  BP: 168/116 158/118 164/123 162/109  Pulse: 93 89 91 90  Temp:      TempSrc:      Resp: _0 Height:      Weight:      SpO2:        Wt Readings from Last 3 Encounters:  10/31/15 75.2 kg (165 lb 12.6 oz)  10/29/15 73.936 kg (163 lb)  10/20/15 74.844 kg (165 lb)     Intake/Output Summary (Last 24 hours) at 10/31/15 1035 Last data filed at 10/31/15 6047  Gross per 24 hour  Intake    420 ml  Output     20 ml  Net    400 ml     Physical Exam  Awake Alert, Oriented X 3, chroncially ill appearing HEENT: Supple Neck,No JVD, No cervical lymphadenopathy appreiciated.  Lungs: Symmetrical Chest wall movement, Good air movement bilaterally, CTAB CVS: RRR,No Gallops,Rubs or new Murmurs, No Parasternal Heave Abd: +ve B.Sounds, Abd Soft, No tenderness, No organomegaly appriciated, No rebound - guarding or rigidity. No Cyanosis, Clubbing or edema, No new Rash or bruise      Data Review:    CBC  Recent Labs Lab 10/29/15 0055 10/31/15 0914  WBC 5.4 6.1  HGB 9.1* 7.8*  HCT 27.8* 24.6*  PLT  151 152  MCV 89.7 87.9  MCH 29.4 27.9  MCHC 32.7 31.7  RDW 16.7* 16.4*    Chemistries   Recent Labs Lab 10/29/15 0055 10/31/15 0914  NA 138 133*  K 4.7 6.2*  CL 102 99*  CO2 23 21*  GLUCOSE 96 80  BUN 30* 58*  CREATININE 7.85* 13.00*  CALCIUM 8.8* 8.5*  AST 52* 52*  ALT 14* 13*  ALKPHOS 112 92  BILITOT 0.7 0.9   ------------------------------------------------------------------------------------------------------------------ No results for input(s): CHOL, HDL, LDLCALC, TRIG, CHOLHDL, LDLDIRECT in the last 72 hours.  Lab Results  Component Value Date   HGBA1C 5.4 02/11/2015   ------------------------------------------------------------------------------------------------------------------ No results for input(s): TSH, T4TOTAL, T3FREE, THYROIDAB in the last 72 hours.  Invalid  input(s): FREET3 ------------------------------------------------------------------------------------------------------------------ No results for input(s): VITAMINB12, FOLATE, FERRITIN, TIBC, IRON, RETICCTPCT in the last 72 hours.  Coagulation profile  Recent Labs Lab 10/30/15 0757 10/31/15 0914  INR 1.40 1.62*    No results for input(s): DDIMER in the last 72 hours.  Cardiac Enzymes No results for input(s): CKMB, TROPONINI, MYOGLOBIN in the last 168 hours.  Invalid input(s): CK ------------------------------------------------------------------------------------------------------------------    Component Value Date/Time   BNP >4500.0* 09/24/2015 0324    Inpatient Medications  Scheduled Meds: . amLODipine  10 mg Oral Daily  . atorvastatin  40 mg Oral q1800  . bisacodyl  10 mg Rectal Once  . carvedilol  6.25 mg Oral BID WC  . cinacalcet  30 mg Oral Q lunch  . darbepoetin (ARANESP) injection - DIALYSIS  100 mcg Intravenous Q Tue-HD  . docusate sodium  100 mg Oral BID  . doxercalciferol  2.5 mcg Intravenous Q T,Th,Sa-HD  . folic acid  1 mg Oral Daily  . gabapentin  100 mg Oral TID  . hydrALAZINE  100 mg Oral 3 times per day  . isosorbide mononitrate  90 mg Oral Daily  . lisinopril  20 mg Oral Daily  . multivitamin  1 tablet Oral QHS  . multivitamin with minerals  1 tablet Oral Daily  . pantoprazole  40 mg Oral Daily  . predniSONE  5 mg Oral Q breakfast  . sevelamer carbonate  1,600 mg Oral TID WC  . sodium chloride flush  3 mL Intravenous Q12H  . thiamine  100 mg Oral Daily   Or  . thiamine  100 mg Intravenous Daily  . Warfarin - Pharmacist Dosing Inpatient   Does not apply q1800   Continuous Infusions:  PRN Meds:.acetaminophen **OR** acetaminophen, ALPRAZolam, HYDROcodone-acetaminophen, HYDROmorphone (DILAUDID) injection, hydrOXYzine, labetalol, levalbuterol, LORazepam **OR** LORazepam, ondansetron **OR** ondansetron (ZOFRAN) IV, sevelamer carbonate  Micro  Results No results found for this or any previous visit (from the past 240 hour(s)).  Radiology Reports Dg Chest 2 View  10/19/2015  CLINICAL DATA:  HTN, ESRD, dialysis patient, Type II diabetes, CHF EXAM: CHEST  2 VIEW COMPARISON:  10/16/2015 FINDINGS: Mild to moderate enlargement of the cardiopericardial silhouette, stable. No mediastinal or hilar masses or evidence of adenopathy. Right internal jugular tunneled dual lumen central venous catheter is stable with its tip in the lower superior vena cava just above the caval atrial junction. Lungs are essentially clear. No evidence of pulmonary edema or pneumonia. No pleural effusion or pneumothorax. Skeletal structures are unremarkable. IMPRESSION: 1. No acute cardiopulmonary disease. Electronically Signed   By: Lajean Manes M.D.   On: 10/19/2015 21:37   Dg Chest 2 View  10/16/2015  CLINICAL DATA:  Chest pain with shortness of breath. Dialysis 3 days ago. Recent  hospitalization. EXAM: CHEST  2 VIEW COMPARISON:  10/06/2015 and 09/23/2015. FINDINGS: 1546 hours. Right IJ dialysis catheters are unchanged at the SVC right atrial level. The mediastinal contours and cardiomegaly appear unchanged. There is mild chronic vascular congestion without overt pulmonary edema, confluent airspace opacity or significant pleural effusion. The bones appear unchanged. IMPRESSION: Cardiomegaly with chronic vascular congestion. No edema or other definite acute findings. Electronically Signed   By: Richardean Sale M.D.   On: 10/16/2015 15:58   Dg Chest 2 View  10/06/2015  CLINICAL DATA:  Acute onset of mid back pain and generalized weakness. Shortness of breath, nausea and vomiting. Initial encounter. EXAM: CHEST  2 VIEW COMPARISON:  Chest radiograph performed 09/23/2015 FINDINGS: The lungs are well-aerated. Vascular congestion is noted. Mildly increased interstitial markings could reflect minimal interstitial edema. There is no evidence of pleural effusion or pneumothorax. The  heart is enlarged. A right-sided dual-lumen catheter is noted ending about the cavoatrial junction. No acute osseous abnormalities are seen. IMPRESSION: Vascular congestion and cardiomegaly. Mildly increased interstitial markings could reflect minimal interstitial edema. Electronically Signed   By: Garald Balding M.D.   On: 10/06/2015 03:04   Dg Abd Acute W/chest  10/29/2015  CLINICAL DATA:  Nausea and vomiting for several days. EXAM: DG ABDOMEN ACUTE W/ 1V CHEST COMPARISON:  10/19/2015 FINDINGS: There is a right jugular central line with tip in the low SVC. There is moderate unchanged cardiomegaly. Mild chronic appearing interstitial coarsening is present, unchanged. No consolidation. No large effusion. The abdominal gas pattern is negative for obstruction or perforation. There is a generous volume of stool throughout the colon. No biliary or urinary calculi are evident. IMPRESSION: Negative abdominal radiographs. No acute cardiopulmonary disease. Unchanged cardiomegaly. Electronically Signed   By: Andreas Newport M.D.   On: 10/29/2015 03:15   Ct Renal Stone Study  10/30/2015  CLINICAL DATA:  Back pain. Nausea vomiting 2 days. Dialysis patient. EXAM: CT ABDOMEN AND PELVIS WITHOUT CONTRAST TECHNIQUE: Multidetector CT imaging of the abdomen and pelvis was performed following the standard protocol without IV contrast. COMPARISON:  CT abdomen pelvis 03/11/2015 FINDINGS: Lower chest: Cardiac enlargement with moderate pericardial effusion. Lung bases clear Hepatobiliary: The liver is mildly enlarged. No focal liver lesion. Gallbladder and bile ducts normal. Pancreas: Negative Spleen: Negative Adrenals/Urinary Tract: Left midpole renal mass measures 39 x 42 mm and shows significant interval growth since the prior study. Limited evaluation without intravenous contrast however this appears to be a solid lesion and is likely renal cell carcinoma. Atrophy of both kidneys without hydronephrosis. No renal calculi.  Urinary bladder is collapsed. Renal transplant in the left pelvis unchanged from the prior study. Limited evaluation without intravenous contrast. Stomach/Bowel: Negative for bowel obstruction. No bowel edema or mass. Appendix not well seen but no evidence of appendicitis. Negative for diverticulitis. Vascular/Lymphatic: Advanced atherosclerotic calcification in the aorta and iliac arteries. No aneurysm. No lymphadenopathy. Reproductive: Negative Other: Negative for ascites. Musculoskeletal: Negative IMPRESSION: Left renal mass has enlarged since the prior CT and is consistent with carcinoma. Renal transplant left lower quadrant with limited evaluation without intravenous contrast. Moderate pericardial effusion Electronically Signed   By: Franchot Gallo M.D.   On: 10/30/2015 07:22   Ct Renal Abd W/wo  10/30/2015  CLINICAL DATA:  Left renal mass, abdominal pain for 2 days with nausea and vomiting. EXAM: CT ABDOMEN AND PELVIS WITHOUT AND WITH CONTRAST TECHNIQUE: Multidetector CT imaging of the abdomen and pelvis was performed following the standard protocol before and following the bolus  administration of intravenous contrast. CONTRAST:  100 cc Isovue-300. COMPARISON:  10/30/2015 and 03/11/2015. FINDINGS: Lower chest: Lung bases show no acute findings. Heart is moderately enlarged. Small pericardial effusion. No pericardial effusion. Hepatobiliary: Liver is heterogeneous. Gallbladder is unremarkable. No biliary ductal dilatation. Pancreas: Negative. Spleen: Negative. Adrenals/Urinary Tract: Adrenal glands are unremarkable. Native kidneys are atrophic. Heterogeneous mass off the interpolar left kidney measures 3.5 cm (401/54), previously 1.9 cm on 03/11/2015. It measures 33 Hounsfield units on precontrast imaging and 53 Hounsfield units on nephrographic phase imaging. Enhancement characteristics are most likely stunted by renal failure. Additional low-attenuation lesions in the kidneys measure up to 1.4 cm on the  right, too small to characterize. Left renal transplant is partially imaged in the left iliac fossa. Stomach/Bowel: Stomach and visualized portions of the small bowel and colon are grossly unremarkable. Vascular/Lymphatic: Atherosclerotic calcification of the arterial vasculature without abdominal aortic aneurysm. Scattered lymph nodes are not enlarged by CT size criteria. Other: No free fluid. Mesenteries and peritoneum are grossly unremarkable. Musculoskeletal: No worrisome lytic or sclerotic lesions. IMPRESSION: 1. Enlarging left renal mass with mild enhancement, worrisome for renal cell carcinoma. Please see discussion above. 2. Small pericardial effusion. 3. Heterogeneity within the liver parenchyma may be due to passive congestion related to impaired cardiac function. Electronically Signed   By: Lorin Picket M.D.   On: 10/30/2015 11:54    Time Spent in minutes  57mn   Johnica Armwood M.D on 10/31/2015 at 10:35 AM  Between 7am to 7pm - Pager - [phone]  After 7pm go to [email or web address] - password [facility] -  Office  [phone]

## 2015-10-31 NOTE — Discharge Instructions (Signed)

## 2015-10-31 NOTE — Progress Notes (Signed)
[place] for warfarin Indication: pulmonary embolus  Allergies  Allergen Reactions  . Butalbital-Apap-Caffeine Shortness Of Breath and Swelling    Swelling in throat  . Ferrlecit [Na Ferric Gluc Cplx In Sucrose] Shortness Of Breath, Swelling and Other (See Comments)    Swelling in throat  . Minoxidil Shortness Of Breath  . Darvocet [Propoxyphene N-Acetaminophen] Hives    Patient Measurements: Height: _0  (188 cm) Weight: 165 lb 12.6 oz (75.2 kg) IBW/kg (Calculated) : 82.2  Vital Signs: Temp: 98.1 F (36.7 C) (04/11 0815) Temp Source: Oral (04/11 0815) BP: 165/124 mmHg (04/11 1100) Pulse Rate: 89 (04/11 1100)  Labs:  Recent Labs  10/29/15 0055 10/30/15 0757 10/31/15 0914  HGB 9.1*  --  7.8*  HCT 27.8*  --  24.6*  PLT 151  --  152  LABPROT  --  17.2* 19.3*  INR  --  1.40 1.62*  CREATININE 7.85*  --  13.00*    Estimated Creatinine Clearance: 7.2 mL/min (by C-G formula based on Cr of 13).   Medical History: Past Medical History  Diagnosis Date  . Hypertension   . Depression   . Complication of anesthesia     itching, sore throat  . Shortness of breath   . Anxiety   . CHF (congestive heart failure) ([place])   . Anemia   . Dialysis patient ([place])   . Type II diabetes mellitus ([facility])     No history per patient, but remains under history as A1c would not be accurate given on dialysis  . ESRD (end stage renal disease) ([place])     due to HTN per patient, followed at [facility], s/p failed kidney transplant - dialysis Tue, Th, Sat  . Renal insufficiency    Medications:  See EMR  Assessment: 50 yo male on warfarin for hx of PE. Currently admitted due to N/V associated with back pain. Last dose taken was on Saturday 4/8. Pt is also ESRD on HD. INR 1.4 on admit  PTA warfarin dose: 9 mg po daily  INR has trended up to 1.6 this morning after dose last night. Hgb down to 7.8 this am, no bleeding noted.  Goal of Therapy:  INR  2-3 Monitor platelets by anticoagulation protocol: Yes    Plan:  -Repeat Warfarin 10 mg po x1 -Daily INR  Erin Hearing PharmD., BCPS Clinical Pharmacist Pager [phone] 10/31/2015 1:10 PM

## 2015-10-31 NOTE — Progress Notes (Signed)
  Echocardiogram 2D Echocardiogram has been performed.  Bobbye Charleston 10/31/2015, 4:26 PM

## 2015-10-31 NOTE — Progress Notes (Signed)
Subjective:  Seen on HD- many subjective complaints- says is throwing up and still having back pain- old incision from hernia with possibly superficial infection not bad Objective Vital signs in last 24 hours: Filed Vitals:   10/31/15 0815 10/31/15 0830 10/31/15 0900 10/31/15 0926  BP: 156/117 168/116 158/118 164/123  Pulse: 88 93 89 91  Temp: 98.1 F (36.7 C)     TempSrc: Oral     Resp: _0 Height:      Weight: 75.2 kg (165 lb 12.6 oz)     SpO2: 100%      Weight change:   Intake/Output Summary (Last 24 hours) at 10/31/15 0939 Last data filed at 10/31/15 5366  Gross per 24 hour  Intake    660 ml  Output     20 ml  Net    640 ml    Assessment: 50 year old noncompliant HD patient presenting with abdominal/back pain and also imaging revealing a renal mass and a pericardial effusion  1 ESRD- notoriously noncompliant. Normally TTS in [place]. Given his incarceration this may have improved his compliance- he also tells me his is trying to do better. Continue HD TTS via [facility]- - says he missed his eval for an access. EDW around 159- or 72 kg. possibly lower- will cont to eval 2 Volume overload- chronically so and also gives him HTN- should improve with HD in AM- will continue to challenge as able- also coreg/norvasc/lisinopril and hydralazine ? 3 Non-adherence to dialysis- could certainly cause a uremic pericardial effusion 4. Renal mass- has been known in the past. Seems unlikely that it would be causing pain unless bleeding into it and there does not seem to be clinical evidence of that. Have to wonder about secondary gain of complaining of pain that necessitates repeated trips to the ER and narcotics. Would eventually need kidney removed - urology has been consulted  5. bones - will continue him on his home Renvela, Hectorol and Sensipar  6. Anemia - have added  Aranesp  7.  Pain seeking/comfort seeking- long standing- eventually he will get bored and sign out  [place]: Basic Metabolic Panel:  Recent Labs Lab 10/29/15 0055  NA 138  K 4.7  CL 102  CO2 23  GLUCOSE 96  BUN 30*  CREATININE 7.85*  CALCIUM 8.8*   Liver Function Tests:  Recent Labs Lab 10/29/15 0055  AST 52*  ALT 14*  ALKPHOS 112  BILITOT 0.7  PROT 7.9  ALBUMIN 2.7*    Recent Labs Lab 10/29/15 0055 10/30/15 0620  LIPASE 89* 52*   No results for input(s): AMMONIA in the last 168 hours. CBC:  Recent Labs Lab 10/29/15 0055 10/31/15 0914  WBC 5.4 6.1  HGB 9.1* 7.8*  HCT 27.8* 24.6*  MCV 89.7 87.9  PLT 151 152   Cardiac Enzymes: No results for input(s): CKTOTAL, CKMB, CKMBINDEX, TROPONINI in the last 168 hours. CBG:  Recent Labs Lab 10/30/15 0944  GLUCAP 78    Iron Studies: No results for input(s): IRON, TIBC, TRANSFERRIN, FERRITIN in the last 72 hours. Studies/Results: Ct Renal Stone Study  10/30/2015  CLINICAL DATA:  Back pain. Nausea vomiting 2 days. Dialysis patient. EXAM: CT ABDOMEN AND PELVIS WITHOUT CONTRAST TECHNIQUE: Multidetector CT imaging of the abdomen and pelvis was performed following the standard protocol without IV contrast. COMPARISON:  CT abdomen pelvis 03/11/2015 FINDINGS: Lower chest: Cardiac enlargement with moderate pericardial effusion. Lung bases  clear Hepatobiliary: The liver is mildly enlarged. No focal liver lesion. Gallbladder and bile ducts normal. Pancreas: Negative Spleen: Negative Adrenals/Urinary Tract: Left midpole renal mass measures 39 x 42 mm and shows significant interval growth since the prior study. Limited evaluation without intravenous contrast however this appears to be a solid lesion and is likely renal cell carcinoma. Atrophy of both kidneys without hydronephrosis. No renal calculi. Urinary bladder is collapsed. Renal transplant in the left pelvis unchanged from the prior study. Limited evaluation without intravenous contrast. Stomach/Bowel: Negative for bowel obstruction. No  bowel edema or mass. Appendix not well seen but no evidence of appendicitis. Negative for diverticulitis. Vascular/Lymphatic: Advanced atherosclerotic calcification in the aorta and iliac arteries. No aneurysm. No lymphadenopathy. Reproductive: Negative Other: Negative for ascites. Musculoskeletal: Negative IMPRESSION: Left renal mass has enlarged since the prior CT and is consistent with carcinoma. Renal transplant left lower quadrant with limited evaluation without intravenous contrast. Moderate pericardial effusion Electronically Signed   By: Franchot Gallo M.D.   On: 10/30/2015 07:22   Ct Renal Abd W/wo  10/30/2015  CLINICAL DATA:  Left renal mass, abdominal pain for 2 days with nausea and vomiting. EXAM: CT ABDOMEN AND PELVIS WITHOUT AND WITH CONTRAST TECHNIQUE: Multidetector CT imaging of the abdomen and pelvis was performed following the standard protocol before and following the bolus administration of intravenous contrast. CONTRAST:  100 cc Isovue-300. COMPARISON:  10/30/2015 and 03/11/2015. FINDINGS: Lower chest: Lung bases show no acute findings. Heart is moderately enlarged. Small pericardial effusion. No pericardial effusion. Hepatobiliary: Liver is heterogeneous. Gallbladder is unremarkable. No biliary ductal dilatation. Pancreas: Negative. Spleen: Negative. Adrenals/Urinary Tract: Adrenal glands are unremarkable. Native kidneys are atrophic. Heterogeneous mass off the interpolar left kidney measures 3.5 cm (401/54), previously 1.9 cm on 03/11/2015. It measures 33 Hounsfield units on precontrast imaging and 53 Hounsfield units on nephrographic phase imaging. Enhancement characteristics are most likely stunted by renal failure. Additional low-attenuation lesions in the kidneys measure up to 1.4 cm on the right, too small to characterize. Left renal transplant is partially imaged in the left iliac fossa. Stomach/Bowel: Stomach and visualized portions of the small bowel and colon are grossly  unremarkable. Vascular/Lymphatic: Atherosclerotic calcification of the arterial vasculature without abdominal aortic aneurysm. Scattered lymph nodes are not enlarged by CT size criteria. Other: No free fluid. Mesenteries and peritoneum are grossly unremarkable. Musculoskeletal: No worrisome lytic or sclerotic lesions. IMPRESSION: 1. Enlarging left renal mass with mild enhancement, worrisome for renal cell carcinoma. Please see discussion above. 2. Small pericardial effusion. 3. Heterogeneity within the liver parenchyma may be due to passive congestion related to impaired cardiac function. Electronically Signed   By: Lorin Picket M.D.   On: 10/30/2015 11:54   Medications: Infusions:    Scheduled Medications: . amLODipine  10 mg Oral Daily  . atorvastatin  40 mg Oral q1800  . bisacodyl  10 mg Rectal Once  . carvedilol  6.25 mg Oral BID WC  . cinacalcet  30 mg Oral Q lunch  . darbepoetin (ARANESP) injection - DIALYSIS  100 mcg Intravenous Q Tue-HD  . docusate sodium  100 mg Oral BID  . doxercalciferol  2.5 mcg Intravenous Q T,Th,Sa-HD  . folic acid  1 mg Oral Daily  . gabapentin  100 mg Oral TID  . hydrALAZINE  100 mg Oral 3 times per day  . isosorbide mononitrate  90 mg Oral Daily  . lisinopril  20 mg Oral Daily  . multivitamin  1 tablet Oral QHS  . multivitamin  with minerals  1 tablet Oral Daily  . pantoprazole  40 mg Oral Daily  . predniSONE  5 mg Oral Q breakfast  . sevelamer carbonate  1,600 mg Oral TID WC  . sodium chloride flush  3 mL Intravenous Q12H  . thiamine  100 mg Oral Daily   Or  . thiamine  100 mg Intravenous Daily  . Warfarin - Pharmacist Dosing Inpatient   Does not apply q1800    have reviewed scheduled and prn medications.  Physical Exam: General: many complaints but seems comfortable Heart: RRR Lungs: moslty clear Abdomen: soft, non tender incision with scant amount of light colored fluid- not frank pus Extremities: no peripheral edema Dialysis Access: PC      10/31/2015,9:39 AM  LOS: 1 day

## 2015-11-01 DIAGNOSIS — M545 Low back pain, unspecified: Secondary | ICD-10-CM | POA: Insufficient documentation

## 2015-11-01 LAB — BASIC METABOLIC PANEL
ANION GAP: 13 (ref 5–15)
BUN: 28 mg/dL — ABNORMAL HIGH (ref 6–20)
CHLORIDE: 95 mmol/L — AB (ref 101–111)
CO2: 25 mmol/L (ref 22–32)
Calcium: 8.8 mg/dL — ABNORMAL LOW (ref 8.9–10.3)
Creatinine, Ser: 8.45 mg/dL — ABNORMAL HIGH (ref 0.61–1.24)
GFR calc non Af Amer: 6 mL/min — ABNORMAL LOW (ref >60)
GFR, EST AFRICAN AMERICAN: 7 mL/min — AB (ref >60)
Glucose, Bld: 99 mg/dL (ref 65–99)
Potassium: 5.2 mmol/L — ABNORMAL HIGH (ref 3.5–5.1)
Sodium: 133 mmol/L — ABNORMAL LOW (ref 135–145)

## 2015-11-01 LAB — PROTIME-INR
INR: 2.06 — ABNORMAL HIGH (ref 0.00–1.49)
Prothrombin Time: 23.1 seconds — ABNORMAL HIGH (ref 11.6–15.2)

## 2015-11-01 MED ORDER — CARVEDILOL 12.5 MG PO TABS
12.5000 mg | ORAL_TABLET | Freq: Two times a day (BID) | ORAL | Status: DC
  Administered 2015-11-01 – 2015-11-04 (×6): 12.5 mg via ORAL
  Filled 2015-11-01 (×6): qty 1

## 2015-11-01 MED ORDER — WARFARIN SODIUM 5 MG PO TABS
9.0000 mg | ORAL_TABLET | Freq: Once | ORAL | Status: AC
  Administered 2015-11-01: 9 mg via ORAL
  Filled 2015-11-01: qty 1

## 2015-11-01 NOTE — Progress Notes (Addendum)
T [facility] Progress Note                                            Patient Demographics:    Frank [name], is a 50 y.o. male, DOB - 07/05/1966, UUV:[id number]  Admit date - 10/30/2015   Admitting Physician Waldemar Dickens, MD  Outpatient Primary MD for the patient is [name] Patient    49/M with ESRD, non compliance, chronic underdialysis per Renal, chronic pain, drug seeking behavior, frequent admissions, recently left AMA on 3/28 after an admission for nonspecific chest pain. Since that time he has been evaluated in the ER 5 times (including for this admission): He was evaluated on 3/30 for medical clearance prior to incarceration, he presented in police custody; on 4/74 his blood pressure was markedly elevated at 199/136 and once again had been brought in by the police for medical clearance prior to re-incarceration; back again on 4/1 apparently for dialysis while incarcerated. On 4/10 here with Nausea, emesis and back pain. K was 6.2, creatinine 13, volume overloaded and noted to have enlarging L renal mass concerning for RCC. Urology consulted, recommended outpt FU for nephrectomy Volume overloaded and getting Dialyzed per renal Continues to report vomiting although none witnessed since admission    Subjective:    continues to complain of nausea, pt reports multiple episodes of vomiting, none witnessed by Staff, per staff eating ok, complains of pain all over esp R flank   Assessment  & Plan :     Left renal mass -likely RCC, seen by Urology-Dr.Dahlstedt -recommended Outpatient FU for nephrectomy   Non compliance/drug seeking behavior -long h/o this -chronically on narcotics, continue vicodin, no indication for IV pain meds    Nausea/vomiting -suspect uremia related, from long standing under dialysis due to non compliance -HD per Renal -d/w Staff yesterday and  today, no vomiting witnessed by RN and NT, although pt reports this, monitor -was also constipated on admission, now resolved    ESRD on dialysis- Tues, Thurs, Sat in [facility], nephro: [name] -non compliant with HD per Renal -volume overloaded chronically due to under dialysis/non compliance, dry weight to be challenged tomorrow per Renal    Chronic combined systolic and diastolic CHF -compensated, volume management with HD -Had echocardiogram 09/09/2015 that demonstrated EF 20-25% with grade 2 diastolic dysfunction with biventricular heart failure, pulmonary hypertension 45 mmHg   Chronic pulmonary embolism  -warfarin per pharmacy, INR was subtherapeutic on admission, now 2.0   Anemia of chronic disease -Hb down to 7.8, no overt bleeding -monitor, needs EPO with HD, transfuse if trends down further -CBC in am   Dyslipidemia -Continue Lipitor  DVT prophylaxis: on warfarin, INR 2  Full Code Family communication: none at bedside, plan d/w pt  Dispo: Home in 1-2days, pending extra volume removal per Renal  Consults  : Renal  Lab Results  Component Value Date   PLT 152 10/31/2015    Anti-infectives    None        Objective:   Filed Vitals:   10/31/15 2100 11/01/15 0445 11/01/15 1134 11/01/15 1331  BP: 151/118 162/117 157/116 153/106  Pulse: 99 94 98 92  Temp: 100.2 F (37.9 C) 98.7 F (37.1 C)  98.3 F (36.8 C)  TempSrc: Oral Oral  Oral  Resp: _0 Height:      Weight:  72 kg (158 lb 11.7 oz)    SpO2: 97% 95%  95%    Wt Readings from Last 3 Encounters:  11/01/15 72 kg (158 lb 11.7 oz)  10/29/15 73.936 kg (163 lb)  10/20/15 74.844 kg (165 lb)     Intake/Output Summary (Last 24 hours) at 11/01/15 1426 Last data filed at 11/01/15 1238  Gross per 24 hour  Intake    480 ml  Output    400 ml  Net     80 ml     Physical Exam  Awake Alert, Oriented X 3, chroncially ill appearing HEENT: Supple Neck,No JVD, No cervical lymphadenopathy  appreiciated, HD catheter noted Lungs: Symmetrical Chest wall movement, Good air movement bilaterally, CTAB CVS: RRR,No Gallops,Rubs or new Murmurs, No Parasternal Heave Abd: +ve B.Sounds, multiple abd scars, pin point lesion with minimal drainage, Abd Soft, No tenderness, No organomegaly appriciated, No rebound - guarding or rigidity. No Cyanosis, Clubbing, 1plus edema, No new Rash or bruise     Data Review:    CBC  Recent Labs Lab 10/29/15 0055 10/31/15 0914  WBC 5.4 6.1  HGB 9.1* 7.8*  HCT 27.8* 24.6*  PLT 151 152  MCV 89.7 87.9  MCH 29.4 27.9  MCHC 32.7 31.7  RDW 16.7* 16.4*    Chemistries   Recent Labs Lab 10/29/15 0055 10/31/15 0914 11/01/15 1017  NA 138 133* 133*  K 4.7 6.2* 5.2*  CL 102 99* 95*  CO2 23 21* 25  GLUCOSE 96 80 99  BUN 30* 58* 28*  CREATININE 7.85* 13.00* 8.45*  CALCIUM 8.8* 8.5* 8.8*  AST 52* 52*  --   ALT 14* 13*  --   ALKPHOS 112 92  --   BILITOT 0.7 0.9  --    ------------------------------------------------------------------------------------------------------------------ No results for input(s): CHOL, HDL, LDLCALC, TRIG, CHOLHDL, LDLDIRECT in the last 72 hours.  Lab Results  Component Value Date   HGBA1C 5.4 02/11/2015   ------------------------------------------------------------------------------------------------------------------ No results for input(s): TSH, T4TOTAL, T3FREE, THYROIDAB in the last 72 hours.  Invalid input(s): FREET3 ------------------------------------------------------------------------------------------------------------------ No results for input(s): VITAMINB12, FOLATE, FERRITIN, TIBC, IRON, RETICCTPCT in the last 72 hours.  Coagulation profile  Recent Labs Lab 10/30/15 0757 10/31/15 0914 11/01/15 0539  INR 1.40 1.62* 2.06*    No results for input(s): DDIMER in the last 72 hours.  Cardiac Enzymes No results for input(s): CKMB, TROPONINI, MYOGLOBIN in the last 168 hours.  Invalid input(s):  CK ------------------------------------------------------------------------------------------------------------------    Component Value Date/Time   BNP >4500.0* 09/24/2015 0324    Inpatient Medications  Scheduled Meds: . amLODipine  10 mg Oral Daily  . atorvastatin  40 mg Oral q1800  . bisacodyl  10 mg Rectal Once  . carvedilol  12.5 mg Oral BID WC  . cinacalcet  30 mg Oral Q lunch  . darbepoetin (ARANESP) injection - DIALYSIS  100 mcg Intravenous Q Tue-HD  . docusate sodium  100 mg Oral BID  . doxercalciferol  2.5 mcg Intravenous Q T,Th,Sa-HD  . folic acid  1 mg Oral Daily  . gabapentin  100 mg Oral TID  . hydrALAZINE  100 mg Oral 3 times per day  . isosorbide mononitrate  90 mg Oral Daily  . lisinopril  20 mg Oral Daily  . multivitamin  1 tablet Oral QHS  . multivitamin with minerals  1 tablet Oral Daily  . pantoprazole  40 mg Oral Daily  . predniSONE  5 mg Oral Q breakfast  . sevelamer carbonate  1,600 mg Oral  TID WC  . sodium chloride flush  3 mL Intravenous Q12H  . thiamine  100 mg Oral Daily   Or  . thiamine  100 mg Intravenous Daily  . warfarin  9 mg Oral ONCE-1800  . Warfarin - Pharmacist Dosing Inpatient   Does not apply q1800   Continuous Infusions:  PRN Meds:.acetaminophen **OR** acetaminophen, ALPRAZolam, HYDROcodone-acetaminophen, HYDROmorphone (DILAUDID) injection, hydrOXYzine, labetalol, levalbuterol, LORazepam **OR** LORazepam, ondansetron **OR** ondansetron (ZOFRAN) IV, sevelamer carbonate  Micro Results No results found for this or any previous visit (from the past 240 hour(s)).  Radiology Reports Dg Chest 2 View  10/19/2015  CLINICAL DATA:  HTN, ESRD, dialysis patient, Type II diabetes, CHF EXAM: CHEST  2 VIEW COMPARISON:  10/16/2015 FINDINGS: Mild to moderate enlargement of the cardiopericardial silhouette, stable. No mediastinal or hilar masses or evidence of adenopathy. Right internal jugular tunneled dual lumen central venous catheter is stable with  its tip in the lower superior vena cava just above the caval atrial junction. Lungs are essentially clear. No evidence of pulmonary edema or pneumonia. No pleural effusion or pneumothorax. Skeletal structures are unremarkable. IMPRESSION: 1. No acute cardiopulmonary disease. Electronically Signed   By: Lajean Manes M.D.   On: 10/19/2015 21:37   Dg Chest 2 View  10/16/2015  CLINICAL DATA:  Chest pain with shortness of breath. Dialysis 3 days ago. Recent hospitalization. EXAM: CHEST  2 VIEW COMPARISON:  10/06/2015 and 09/23/2015. FINDINGS: 1546 hours. Right IJ dialysis catheters are unchanged at the SVC right atrial level. The mediastinal contours and cardiomegaly appear unchanged. There is mild chronic vascular congestion without overt pulmonary edema, confluent airspace opacity or significant pleural effusion. The bones appear unchanged. IMPRESSION: Cardiomegaly with chronic vascular congestion. No edema or other definite acute findings. Electronically Signed   By: Richardean Sale M.D.   On: 10/16/2015 15:58   Dg Chest 2 View  10/06/2015  CLINICAL DATA:  Acute onset of mid back pain and generalized weakness. Shortness of breath, nausea and vomiting. Initial encounter. EXAM: CHEST  2 VIEW COMPARISON:  Chest radiograph performed 09/23/2015 FINDINGS: The lungs are well-aerated. Vascular congestion is noted. Mildly increased interstitial markings could reflect minimal interstitial edema. There is no evidence of pleural effusion or pneumothorax. The heart is enlarged. A right-sided dual-lumen catheter is noted ending about the cavoatrial junction. No acute osseous abnormalities are seen. IMPRESSION: Vascular congestion and cardiomegaly. Mildly increased interstitial markings could reflect minimal interstitial edema. Electronically Signed   By: Garald Balding M.D.   On: 10/06/2015 03:04   Dg Abd Acute W/chest  10/29/2015  CLINICAL DATA:  Nausea and vomiting for several days. EXAM: DG ABDOMEN ACUTE W/ 1V CHEST  COMPARISON:  10/19/2015 FINDINGS: There is a right jugular central line with tip in the low SVC. There is moderate unchanged cardiomegaly. Mild chronic appearing interstitial coarsening is present, unchanged. No consolidation. No large effusion. The abdominal gas pattern is negative for obstruction or perforation. There is a generous volume of stool throughout the colon. No biliary or urinary calculi are evident. IMPRESSION: Negative abdominal radiographs. No acute cardiopulmonary disease. Unchanged cardiomegaly. Electronically Signed   By: Andreas Newport M.D.   On: 10/29/2015 03:15   Ct Renal Stone Study  10/30/2015  CLINICAL DATA:  Back pain. Nausea vomiting 2 days. Dialysis patient. EXAM: CT ABDOMEN AND PELVIS WITHOUT CONTRAST TECHNIQUE: Multidetector CT imaging of the abdomen and pelvis was performed following the standard protocol without IV contrast. COMPARISON:  CT abdomen pelvis 03/11/2015 FINDINGS: Lower chest: Cardiac enlargement with  moderate pericardial effusion. Lung bases clear Hepatobiliary: The liver is mildly enlarged. No focal liver lesion. Gallbladder and bile ducts normal. Pancreas: Negative Spleen: Negative Adrenals/Urinary Tract: Left midpole renal mass measures 39 x 42 mm and shows significant interval growth since the prior study. Limited evaluation without intravenous contrast however this appears to be a solid lesion and is likely renal cell carcinoma. Atrophy of both kidneys without hydronephrosis. No renal calculi. Urinary bladder is collapsed. Renal transplant in the left pelvis unchanged from the prior study. Limited evaluation without intravenous contrast. Stomach/Bowel: Negative for bowel obstruction. No bowel edema or mass. Appendix not well seen but no evidence of appendicitis. Negative for diverticulitis. Vascular/Lymphatic: Advanced atherosclerotic calcification in the aorta and iliac arteries. No aneurysm. No lymphadenopathy. Reproductive: Negative Other: Negative for  ascites. Musculoskeletal: Negative IMPRESSION: Left renal mass has enlarged since the prior CT and is consistent with carcinoma. Renal transplant left lower quadrant with limited evaluation without intravenous contrast. Moderate pericardial effusion Electronically Signed   By: Franchot Gallo M.D.   On: 10/30/2015 07:22   Ct Renal Abd W/wo  10/30/2015  CLINICAL DATA:  Left renal mass, abdominal pain for 2 days with nausea and vomiting. EXAM: CT ABDOMEN AND PELVIS WITHOUT AND WITH CONTRAST TECHNIQUE: Multidetector CT imaging of the abdomen and pelvis was performed following the standard protocol before and following the bolus administration of intravenous contrast. CONTRAST:  100 cc Isovue-300. COMPARISON:  10/30/2015 and 03/11/2015. FINDINGS: Lower chest: Lung bases show no acute findings. Heart is moderately enlarged. Small pericardial effusion. No pericardial effusion. Hepatobiliary: Liver is heterogeneous. Gallbladder is unremarkable. No biliary ductal dilatation. Pancreas: Negative. Spleen: Negative. Adrenals/Urinary Tract: Adrenal glands are unremarkable. Native kidneys are atrophic. Heterogeneous mass off the interpolar left kidney measures 3.5 cm (401/54), previously 1.9 cm on 03/11/2015. It measures 33 Hounsfield units on precontrast imaging and 53 Hounsfield units on nephrographic phase imaging. Enhancement characteristics are most likely stunted by renal failure. Additional low-attenuation lesions in the kidneys measure up to 1.4 cm on the right, too small to characterize. Left renal transplant is partially imaged in the left iliac fossa. Stomach/Bowel: Stomach and visualized portions of the small bowel and colon are grossly unremarkable. Vascular/Lymphatic: Atherosclerotic calcification of the arterial vasculature without abdominal aortic aneurysm. Scattered lymph nodes are not enlarged by CT size criteria. Other: No free fluid. Mesenteries and peritoneum are grossly unremarkable. Musculoskeletal: No  worrisome lytic or sclerotic lesions. IMPRESSION: 1. Enlarging left renal mass with mild enhancement, worrisome for renal cell carcinoma. Please see discussion above. 2. Small pericardial effusion. 3. Heterogeneity within the liver parenchyma may be due to passive congestion related to impaired cardiac function. Electronically Signed   By: Lorin Picket M.D.   On: 10/30/2015 11:54    Time Spent in minutes  18mn   Belle Charlie M.D on 11/01/2015 at 2:26 PM  Between 7am to 7pm - Pager - [phone]  After 7pm go to [email or web address] - password [email or web address] -  Office  [phone]

## 2015-11-01 NOTE — Progress Notes (Signed)
ANTICOAGULATION CONSULT NOTE - Follow Up Consult  Pharmacy Consult for Coumadin Indication: hx PE  Allergies  Allergen Reactions  . Butalbital-Apap-Caffeine Shortness Of Breath and Swelling    Swelling in throat  . Ferrlecit [Na Ferric Gluc Cplx In Sucrose] Shortness Of Breath, Swelling and Other (See Comments)    Swelling in throat  . Minoxidil Shortness Of Breath  . Darvocet [Propoxyphene N-Acetaminophen] Hives    Patient Measurements: Height: _0  (188 cm) Weight: 158 lb 11.7 oz (72 kg) IBW/kg (Calculated) : 82.2   Vital Signs: Temp: 98.7 F (37.1 C) (04/12 0445) Temp Source: Oral (04/12 0445) BP: 162/117 mmHg (04/12 0445) Pulse Rate: 94 (04/12 0445)  Labs:  Recent Labs  10/30/15 0757 10/31/15 0914 11/01/15 0539  HGB  --  7.8*  --   HCT  --  24.6*  --   PLT  --  152  --   LABPROT 17.2* 19.3* 23.1*  INR 1.40 1.62* 2.06*  CREATININE  --  13.00*  --     Estimated Creatinine Clearance: 6.8 mL/min (by C-G formula based on Cr of 13).  Assessment: 49yom continues on coumadin for hx chronic PE. INR subtherapeutic on admit and he received 2 days of 3m. INR is now at goal today 2.06.  Home dose: 930mdaily  Goal of Therapy:  INR 2-3 Monitor platelets by anticoagulation protocol: Yes   Plan:  1) Coumadin 58m2m 1 2) Daily INR  MarDeboraha Sprang12/2017,9:47 AM

## 2015-11-01 NOTE — Progress Notes (Signed)
Subjective:  BP is still high- he got to what I thought his EDW was yest- maybe it can be lower- still c/o back pain and nausea Objective Vital signs in last 24 hours: Filed Vitals:   10/31/15 1405 10/31/15 1732 10/31/15 2100 11/01/15 0445  BP: 185/123 164/114 151/118 162/117  Pulse:  96 99 94  Temp:   100.2 F (37.9 C) 98.7 F (37.1 C)  TempSrc:   Oral Oral  Resp:   18 20  Height:      Weight:    72 kg (158 lb 11.7 oz)  SpO2:   97% 95%   Weight change: 1.3 kg (2 lb 13.9 oz)  Intake/Output Summary (Last 24 hours) at 11/01/15 0910 Last data filed at 10/31/15 2104  Gross per 24 hour  Intake    360 ml  Output      0 ml  Net    360 ml    Assessment: 50 year old noncompliant HD patient presenting with abdominal/back pain and also imaging revealing a renal mass and a pericardial effusion  1 ESRD- notoriously noncompliant in past. Normally TTS in [place]. Given his incarceration this may have improved his compliance- he also tells me his is trying to do better. Continue HD TTS via [facility]- - says he missed his eval for an access. EDW around 159- or 72 kg. possibly lower- will challenge tomorrow 2 Volume overload- chronically so and also gives him HTN- BP did not budge with HD- willl increase coreg and challenge tomorrow 3 Non-adherence to dialysis- could certainly cause a uremic pericardial effusion 4. Renal mass- has been known in the past. Seems unlikely that it would be causing pain unless bleeding into it and there does not seem to be clinical evidence of that. Have to wonder about secondary gain of complaining of pain that necessitates repeated trips to the ER and narcotics. Would eventually need kidney removed - urology has been consulted  5. bones - will continue him on his home Renvela, Hectorol and Sensipar  6. Anemia - have added  Aranesp - will check iron 7.  Pain seeking/comfort seeking- long standing- eventually I feel he will get bored and sign out  [place]: Basic Metabolic Panel:  Recent Labs Lab 10/29/15 0055 10/31/15 0914  NA 138 133*  K 4.7 6.2*  CL 102 99*  CO2 23 21*  GLUCOSE 96 80  BUN 30* 58*  CREATININE 7.85* 13.00*  CALCIUM 8.8* 8.5*   Liver Function Tests:  Recent Labs Lab 10/29/15 0055 10/31/15 0914  AST 52* 52*  ALT 14* 13*  ALKPHOS 112 92  BILITOT 0.7 0.9  PROT 7.9 7.3  ALBUMIN 2.7* 2.8*    Recent Labs Lab 10/29/15 0055 10/30/15 0620  LIPASE 89* 52*   No results for input(s): AMMONIA in the last 168 hours. CBC:  Recent Labs Lab 10/29/15 0055 10/31/15 0914  WBC 5.4 6.1  HGB 9.1* 7.8*  HCT 27.8* 24.6*  MCV 89.7 87.9  PLT 151 152   Cardiac Enzymes: No results for input(s): CKTOTAL, CKMB, CKMBINDEX, TROPONINI in the last 168 hours. CBG:  Recent Labs Lab 10/30/15 0944  GLUCAP 78    Iron Studies: No results for input(s): IRON, TIBC, TRANSFERRIN, FERRITIN in the last 72 hours. Studies/Results: Ct Renal Abd W/wo  10/30/2015  CLINICAL DATA:  Left renal mass, abdominal pain for 2 days with nausea and vomiting. EXAM: CT ABDOMEN AND PELVIS WITHOUT AND WITH CONTRAST TECHNIQUE:  Multidetector CT imaging of the abdomen and pelvis was performed following the standard protocol before and following the bolus administration of intravenous contrast. CONTRAST:  100 cc Isovue-300. COMPARISON:  10/30/2015 and 03/11/2015. FINDINGS: Lower chest: Lung bases show no acute findings. Heart is moderately enlarged. Small pericardial effusion. No pericardial effusion. Hepatobiliary: Liver is heterogeneous. Gallbladder is unremarkable. No biliary ductal dilatation. Pancreas: Negative. Spleen: Negative. Adrenals/Urinary Tract: Adrenal glands are unremarkable. Native kidneys are atrophic. Heterogeneous mass off the interpolar left kidney measures 3.5 cm (401/54), previously 1.9 cm on 03/11/2015. It measures 33 Hounsfield units on precontrast imaging and 53 Hounsfield units on  nephrographic phase imaging. Enhancement characteristics are most likely stunted by renal failure. Additional low-attenuation lesions in the kidneys measure up to 1.4 cm on the right, too small to characterize. Left renal transplant is partially imaged in the left iliac fossa. Stomach/Bowel: Stomach and visualized portions of the small bowel and colon are grossly unremarkable. Vascular/Lymphatic: Atherosclerotic calcification of the arterial vasculature without abdominal aortic aneurysm. Scattered lymph nodes are not enlarged by CT size criteria. Other: No free fluid. Mesenteries and peritoneum are grossly unremarkable. Musculoskeletal: No worrisome lytic or sclerotic lesions. IMPRESSION: 1. Enlarging left renal mass with mild enhancement, worrisome for renal cell carcinoma. Please see discussion above. 2. Small pericardial effusion. 3. Heterogeneity within the liver parenchyma may be due to passive congestion related to impaired cardiac function. Electronically Signed   By: Lorin Picket M.D.   On: 10/30/2015 11:54   Medications: Infusions:    Scheduled Medications: . amLODipine  10 mg Oral Daily  . atorvastatin  40 mg Oral q1800  . bisacodyl  10 mg Rectal Once  . carvedilol  6.25 mg Oral BID WC  . cinacalcet  30 mg Oral Q lunch  . darbepoetin (ARANESP) injection - DIALYSIS  100 mcg Intravenous Q Tue-HD  . docusate sodium  100 mg Oral BID  . doxercalciferol  2.5 mcg Intravenous Q T,Th,Sa-HD  . folic acid  1 mg Oral Daily  . gabapentin  100 mg Oral TID  . hydrALAZINE  100 mg Oral 3 times per day  . isosorbide mononitrate  90 mg Oral Daily  . lisinopril  20 mg Oral Daily  . multivitamin  1 tablet Oral QHS  . multivitamin with minerals  1 tablet Oral Daily  . pantoprazole  40 mg Oral Daily  . predniSONE  5 mg Oral Q breakfast  . sevelamer carbonate  1,600 mg Oral TID WC  . sodium chloride flush  3 mL Intravenous Q12H  . thiamine  100 mg Oral Daily   Or  . thiamine  100 mg Intravenous  Daily  . Warfarin - Pharmacist Dosing Inpatient   Does not apply q1800    have reviewed scheduled and prn medications.  Physical Exam: General: many complaints but seems comfortable Heart: RRR Lungs: moslty clear Abdomen: soft, non tender incision with scant amount of light colored fluid- not frank pus Extremities: no peripheral edema Dialysis Access: PC     11/01/2015,9:10 AM  LOS: 2 days

## 2015-11-01 NOTE — Care Management Important Message (Signed)
Important Message  Patient Details  Name: Frank [name] MRN: [id number] Date of Birth: [birth year]   Medicare Important Message Given:  Yes    Verdon Ferrante Abena 11/01/2015, 11:08 AM

## 2015-11-01 NOTE — Progress Notes (Signed)
Utilization review completed

## 2015-11-02 DIAGNOSIS — D638 Anemia in other chronic diseases classified elsewhere: Secondary | ICD-10-CM

## 2015-11-02 DIAGNOSIS — N2889 Other specified disorders of kidney and ureter: Secondary | ICD-10-CM

## 2015-11-02 DIAGNOSIS — M545 Low back pain: Secondary | ICD-10-CM

## 2015-11-02 DIAGNOSIS — I10 Essential (primary) hypertension: Secondary | ICD-10-CM

## 2015-11-02 DIAGNOSIS — I5042 Chronic combined systolic (congestive) and diastolic (congestive) heart failure: Secondary | ICD-10-CM

## 2015-11-02 LAB — BASIC METABOLIC PANEL
Anion gap: 12 (ref 5–15)
BUN: 38 mg/dL — AB (ref 6–20)
CO2: 24 mmol/L (ref 22–32)
Calcium: 8.6 mg/dL — ABNORMAL LOW (ref 8.9–10.3)
Chloride: 96 mmol/L — ABNORMAL LOW (ref 101–111)
Creatinine, Ser: 10.06 mg/dL — ABNORMAL HIGH (ref 0.61–1.24)
GFR, EST AFRICAN AMERICAN: 6 mL/min — AB (ref >60)
GFR, EST NON AFRICAN AMERICAN: 5 mL/min — AB (ref >60)
Glucose, Bld: 89 mg/dL (ref 65–99)
Potassium: 5 mmol/L (ref 3.5–5.1)
Sodium: 132 mmol/L — ABNORMAL LOW (ref 135–145)

## 2015-11-02 LAB — IRON AND TIBC
IRON: 28 ug/dL — AB (ref 45–182)
SATURATION RATIOS: 13 % — AB (ref 17.9–39.5)
TIBC: 210 ug/dL — AB (ref 250–450)
UIBC: 182 ug/dL

## 2015-11-02 LAB — CBC
HCT: 24.6 % — ABNORMAL LOW (ref 39.0–52.0)
HEMOGLOBIN: 7.7 g/dL — AB (ref 13.0–17.0)
MCH: 27.3 pg (ref 26.0–34.0)
MCHC: 31.3 g/dL (ref 30.0–36.0)
MCV: 87.2 fL (ref 78.0–100.0)
PLATELETS: 152 10*3/uL (ref 150–400)
RBC: 2.82 MIL/uL — AB (ref 4.22–5.81)
RDW: 16.2 % — ABNORMAL HIGH (ref 11.5–15.5)
WBC: 5 10*3/uL (ref 4.0–10.5)

## 2015-11-02 LAB — PROTIME-INR
INR: 2.3 — AB (ref 0.00–1.49)
PROTHROMBIN TIME: 25 s — AB (ref 11.6–15.2)

## 2015-11-02 LAB — THC,MS,WB/SP RFX
CANNABIDIOL: NEGATIVE ng/mL
CARBOXY-THC: 65.5 ng/mL
Cannabinoid Confirmation: POSITIVE
Cannabinol: NEGATIVE ng/mL
Hydroxy-THC: 3.8 ng/mL
Tetrahydrocannabinol(THC): 3.6 ng/mL

## 2015-11-02 LAB — FERRITIN: FERRITIN: 328 ng/mL (ref 24–336)

## 2015-11-02 MED ORDER — DIPHENHYDRAMINE HCL 25 MG PO CAPS
25.0000 mg | ORAL_CAPSULE | ORAL | Status: DC
  Administered 2015-11-02: 25 mg via ORAL
  Filled 2015-11-02: qty 1

## 2015-11-02 MED ORDER — WARFARIN SODIUM 5 MG PO TABS
9.0000 mg | ORAL_TABLET | Freq: Every day | ORAL | Status: DC
Start: 2015-11-02 — End: 2015-11-03
  Administered 2015-11-02: 9 mg via ORAL
  Filled 2015-11-02 (×2): qty 1

## 2015-11-02 MED ORDER — METHOCARBAMOL 500 MG PO TABS
500.0000 mg | ORAL_TABLET | Freq: Three times a day (TID) | ORAL | Status: DC | PRN
  Administered 2015-11-02: 500 mg via ORAL
  Filled 2015-11-02: qty 1

## 2015-11-02 MED ORDER — SODIUM CHLORIDE 0.9 % IV SOLN
510.0000 mg | INTRAVENOUS | Status: DC
  Administered 2015-11-02: 510 mg via INTRAVENOUS
  Filled 2015-11-02: qty 17

## 2015-11-02 MED ORDER — DOXERCALCIFEROL 4 MCG/2ML IV SOLN
INTRAVENOUS | Status: AC
  Administered 2015-11-02: 2.5 ug via INTRAVENOUS
  Filled 2015-11-02: qty 2

## 2015-11-02 NOTE — Progress Notes (Signed)
OT Cancellation Note  Patient Details Name: Frank [name] MRN: [id number] DOB: 1965-09-14   Cancelled Treatment:    Reason Eval/Treat Not Completed: Patient declined, no reason specified  Malka So 11/02/2015, 3:19 PM

## 2015-11-02 NOTE — Progress Notes (Signed)
PT Cancellation Note  Patient Details Name: Frank [name] MRN: [id number] DOB: [birth year]   Cancelled Treatment:    Reason Eval/Treat Not Completed: Patient at HD, will continue to follow.    Cassell Clement, PT, CSCS Pager [phone] Office [phone]  11/02/2015, 8:15 AM

## 2015-11-02 NOTE — Progress Notes (Signed)
ANTICOAGULATION CONSULT NOTE - Follow Up Consult  Pharmacy Consult for Coumadin Indication: hx PE  Allergies  Allergen Reactions  . Butalbital-Apap-Caffeine Shortness Of Breath and Swelling    Swelling in throat  . Ferrlecit [Na Ferric Gluc Cplx In Sucrose] Shortness Of Breath, Swelling and Other (See Comments)    Swelling in throat  . Minoxidil Shortness Of Breath  . Darvocet [Propoxyphene N-Acetaminophen] Hives    Patient Measurements: Height: _0  (188 cm) Weight: 159 lb 2.8 oz (72.2 kg) IBW/kg (Calculated) : 82.2   Vital Signs: Temp: 98.2 F (36.8 C) (04/13 0648) Temp Source: Oral (04/13 0648) BP: 142/97 mmHg (04/13 0900) Pulse Rate: 83 (04/13 0900)  Labs:  Recent Labs  10/31/15 0914 11/01/15 0539 11/01/15 1017 11/02/15 0346  HGB 7.8*  --   --  7.7*  HCT 24.6*  --   --  24.6*  PLT 152  --   --  152  LABPROT 19.3* 23.1*  --  25.0*  INR 1.62* 2.06*  --  2.30*  CREATININE 13.00*  --  8.45* 10.06*    Estimated Creatinine Clearance: 8.9 mL/min (by C-G formula based on Cr of 10.06).  Assessment: 49yom continues on coumadin for hx chronic PE. INR subtherapeutic on admit and he received 2 days of 62m.  INR is now at goal today 2.3.  Home dose: 940mdaily  Goal of Therapy:  INR 2-3 Monitor platelets by anticoagulation protocol: Yes   Plan:  1) Coumadin 17m2m 1 2) Daily INR  FraErin HearingarmD., BCPS Clinical Pharmacist Pager [phone]/2017 9:05 AM

## 2015-11-02 NOTE — Progress Notes (Signed)
Subjective:  BP is better- seen on HD- Says is throwing up but not witnessed also still with pain   Objective Vital signs in last 24 hours: Filed Vitals:   11/02/15 0700 11/02/15 0730 11/02/15 0800 11/02/15 0830  BP: 165/113 146/95 143/90 140/95  Pulse: 82 84 82 82  Temp:      TempSrc:      Resp:      Height:      Weight:      SpO2:       Weight change: -2.624 kg (-5 lb 12.6 oz)  Intake/Output Summary (Last 24 hours) at 11/02/15 0859 Last data filed at 11/01/15 2114  Gross per 24 hour  Intake      3 ml  Output    400 ml  Net   -397 ml    Assessment: 50 year old noncompliant HD patient presenting with abdominal/back pain and also imaging revealing a renal mass and a pericardial effusion  1 ESRD- notoriously noncompliant in past. Normally TTS in [place].  he tells me his is trying to do better. Continue HD TTS via PC- - says he missed his eval for an access. EDW around 159- or 72 kg. possibly lower- challenging today  2 Volume overload- chronically so and also gives him HTN- BP did not budge with HD- willl increase coreg and challenge today- making progress 3 Non-adherence to dialysis- could certainly cause a uremic pericardial effusion- compliance with HD will help  4. Renal mass- has been known in the past. Seems unlikely that it would be causing pain unless bleeding into it and there does not seem to be clinical evidence of that. Have to wonder about secondary gain of complaining of pain that necessitates repeated trips to the ER and narcotics. Would eventually need kidney removed - urology has been consulted  5. bones - will continue him on his home Renvela, Hectorol and Sensipar  6. Anemia - have added  Aranesp - iron low- even though claims reaction to iron have talked to him today and he is willing to try feraheme 7.  Pain seeking/comfort seeking- long standing- eventually I feel he will get bored and sign out AMA- does not need to stay inpatient for my  issues    Taisa Deloria A    Labs: Basic Metabolic Panel:  Recent Labs Lab 10/31/15 0914 11/01/15 1017 11/02/15 0346  NA 133* 133* 132*  K 6.2* 5.2* 5.0  CL 99* 95* 96*  CO2 21* 25 24  GLUCOSE 80 99 89  BUN 58* 28* 38*  CREATININE 13.00* 8.45* 10.06*  CALCIUM 8.5* 8.8* 8.6*   Liver Function Tests:  Recent Labs Lab 10/29/15 0055 10/31/15 0914  AST 52* 52*  ALT 14* 13*  ALKPHOS 112 92  BILITOT 0.7 0.9  PROT 7.9 7.3  ALBUMIN 2.7* 2.8*    Recent Labs Lab 10/29/15 0055 10/30/15 0620  LIPASE 89* 52*   No results for input(s): AMMONIA in the last 168 hours. CBC:  Recent Labs Lab 10/29/15 0055 10/31/15 0914 11/02/15 0346  WBC 5.4 6.1 5.0  HGB 9.1* 7.8* 7.7*  HCT 27.8* 24.6* 24.6*  MCV 89.7 87.9 87.2  PLT 151 152 152   Cardiac Enzymes: No results for input(s): CKTOTAL, CKMB, CKMBINDEX, TROPONINI in the last 168 hours. CBG:  Recent Labs Lab 10/30/15 0944  GLUCAP 78    Iron Studies:   Recent Labs  11/02/15 0346  IRON 28*  TIBC 210*  FERRITIN 328   Studies/Results: No results found.  Medications: Infusions:    Scheduled Medications: . amLODipine  10 mg Oral Daily  . atorvastatin  40 mg Oral q1800  . bisacodyl  10 mg Rectal Once  . carvedilol  12.5 mg Oral BID WC  . cinacalcet  30 mg Oral Q lunch  . darbepoetin (ARANESP) injection - DIALYSIS  100 mcg Intravenous Q Tue-HD  . docusate sodium  100 mg Oral BID  . doxercalciferol  2.5 mcg Intravenous Q T,Th,Sa-HD  . folic acid  1 mg Oral Daily  . gabapentin  100 mg Oral TID  . hydrALAZINE  100 mg Oral 3 times per day  . isosorbide mononitrate  90 mg Oral Daily  . lisinopril  20 mg Oral Daily  . multivitamin  1 tablet Oral QHS  . multivitamin with minerals  1 tablet Oral Daily  . pantoprazole  40 mg Oral Daily  . sevelamer carbonate  1,600 mg Oral TID WC  . sodium chloride flush  3 mL Intravenous Q12H  . thiamine  100 mg Oral Daily   Or  . thiamine  100 mg Intravenous Daily  .  Warfarin - Pharmacist Dosing Inpatient   Does not apply q1800    have reviewed scheduled and prn medications.  Physical Exam: General: sleeping soundly with HD Heart: RRR Lungs: moslty clear Abdomen: soft, non tender incision with scant amount of light colored fluid- not frank pus Extremities: no peripheral edema Dialysis Access: PC     11/02/2015,8:59 AM  LOS: 3 days

## 2015-11-02 NOTE — Progress Notes (Signed)
OT Cancellation Note  Patient Details Name: Frank [name] MRN: [id number] DOB: 24-Feb-1966   Cancelled Treatment:    Reason Eval/Treat Not Completed: Patient at procedure or test/ unavailable (HD). Will follow.  Malka So 11/02/2015, 8:09 AM

## 2015-11-02 NOTE — Procedures (Signed)
Patient was seen on dialysis and the procedure was supervised.  BFR 400  Via PC BP is  142/97.   Patient appears to be tolerating treatment well  Benna Arno A 11/02/2015

## 2015-11-02 NOTE — Progress Notes (Signed)
[facility]                                                                              Patient Demographics  Frank [name], is a 50 y.o. male, DOB - [birth year], IEO:[id number]  Admit date - 10/30/2015   Admitting Physician Waldemar Dickens, MD  Outpatient Primary MD for the patient is [name] Patient  LOS - 3  days    Chief Complaint  Patient presents with  . Nausea  . Back Pain       Brief HPI    49/M with ESRD, non compliance, chronic underdialysis per Renal, chronic pain, drug seeking behavior, frequent admissions, recently left AMA on 3/28 after an admission for nonspecific chest pain. Since that time he has been evaluated in the ER 5 times (including for this admission): He was evaluated on 3/30 for medical clearance prior to incarceration, he presented in police custody; on 6/13 his blood pressure was markedly elevated at 199/136 and once again had been brought in by the police for medical clearance prior to re-incarceration; back again on 4/1 apparently for dialysis while incarcerated. On 4/10 here with Nausea, emesis and back pain. K was 6.2, creatinine 13, volume overloaded and noted to have enlarging L renal mass concerning for RCC. Urology was consulted, recommended outpt FU for nephrectomy Volume overloaded and getting Dialyzed per renal   Assessment & Plan   Left renal mass -likely RCC, seen by Urology-Dr.Dahlstedt -recommended Outpatient Follow-up for nephrectomy  Non compliance/drug seeking behavior: Patient asking for IV pain medications repeatedly during examination -Patient has a long history of noncompliance, narcotic seeking behavior -chronically on narcotics, continue vicodin, no indication for IV pain meds   Nausea/vomiting -suspect uremia related, from long standing under dialysis due to non compliance -HD per Renal -Tolerating diet without any difficulty, although reporting unwitnessed vomiting episodes - CT abdomen/renal stone  study had shown enlarging left renal mass, consistent with carcinoma, no bowel obstruction or acute intra-abdominal pathology.   ESRD on dialysis- Tues, Thurs, Sat in [place], nephro: [name] -non compliant with HD per Renal -volume overloaded chronically due to under dialysis/non compliance - CT abdomen also showed moderate pericardial effusion likely due to volume overload.   Chronic combined systolic and diastolic CHF -compensated, volume management with HD -CT abdomen had also shown moderate pericardial effusion likely due to volume overload - 2-D echo 4/11 showed EF of 35-40%, diffuse hypokinesis, PA pressure 52 ( echo 2/17 had shown EF of 20-25% with grade 2 diastolic dysfunction).   Chronic pulmonary embolism  -warfarin per pharmacy, INR was subtherapeutic on admission   Anemia of chronic disease -no overt bleeding -monitor CBC, needs EPO with HD, transfuse if trends down further   Dyslipidemia -Continue Lipitor  Code Status: Full code  Family Communication: Discussed in detail with the patient, all imaging results, lab results explained to the patient   Disposition Plan: when cleared by nephrology  Time Spent in minutes 25 minutes  Procedures  HD  Consults   renal  DVT Prophylaxis  SCD's  Medications  Scheduled Meds: . amLODipine  10 mg Oral Daily  .  atorvastatin  40 mg Oral q1800  . bisacodyl  10 mg Rectal Once  . carvedilol  12.5 mg Oral BID WC  . cinacalcet  30 mg Oral Q lunch  . darbepoetin (ARANESP) injection - DIALYSIS  100 mcg Intravenous Q Tue-HD  . diphenhydrAMINE  25 mg Oral Once per day on Mon Thu  . docusate sodium  100 mg Oral BID  . doxercalciferol  2.5 mcg Intravenous Q T,Th,Sa-HD  . ferumoxytol  510 mg Intravenous Once per day on Mon Thu  . folic acid  1 mg Oral Daily  . gabapentin  100 mg Oral TID  . hydrALAZINE  100 mg Oral 3 times per day  . isosorbide mononitrate  90 mg Oral Daily  . lisinopril  20 mg Oral Daily  .  multivitamin  1 tablet Oral QHS  . multivitamin with minerals  1 tablet Oral Daily  . pantoprazole  40 mg Oral Daily  . sevelamer carbonate  1,600 mg Oral TID WC  . sodium chloride flush  3 mL Intravenous Q12H  . thiamine  100 mg Oral Daily   Or  . thiamine  100 mg Intravenous Daily  . Warfarin - Pharmacist Dosing Inpatient   Does not apply q1800   Continuous Infusions:  PRN Meds:.acetaminophen **OR** acetaminophen, ALPRAZolam, HYDROcodone-acetaminophen, hydrOXYzine, labetalol, levalbuterol, ondansetron **OR** ondansetron (ZOFRAN) IV, sevelamer carbonate   Antibiotics   Anti-infectives    None        Subjective:   Frank [name] was seen and examined today.  Continues to complain of back pain and specifically asking for IV pain medications/narcotics. Patient denies dizziness, chest pain, shortness of breath, abdominal pain, new weakness, numbess, tingling. No acute events overnight.  No documented vomiting episodes.   Objective:   Filed Vitals:   11/02/15 0930 11/02/15 1000 11/02/15 1030 11/02/15 1055  BP: 157/103 142/92 156/102 174/121  Pulse: 81 82 82 84  Temp:    98.3 F (36.8 C)  TempSrc:    Oral  Resp:    21  Height:      Weight:    69 kg (152 lb 1.9 oz)  SpO2:    96%    Intake/Output Summary (Last 24 hours) at 11/02/15 1237 Last data filed at 11/02/15 1055  Gross per 24 hour  Intake      3 ml  Output   3400 ml  Net  -3397 ml     Wt Readings from Last 3 Encounters:  11/02/15 69 kg (152 lb 1.9 oz)  10/29/15 73.936 kg (163 lb)  10/20/15 74.844 kg (165 lb)     Exam  General: Alert and oriented x 3, NAD  HEENT:    Neck : Supple no JVD   CVS: S1 S2 auscultated, no rubs, murmurs or gallops. Regular rate and rhythm.  Respiratory: Clear to auscultation bilaterally, no wheezing, rales or rhonchi  Abdomen: Soft, nontender, nondistended, + bowel sounds, multiple abdominal scars, no rebound or guarding   Ext: no cyanosis clubbing , 1+ edema  Neuro: no  new deficits   Skin: No rashes  Psych: Normal affect and demeanor, alert and oriented x3    Data Reviewed:  I have personally reviewed following labs and imaging studies  Micro Results No results found for this or any previous visit (from the past 240 hour(s)).  Radiology Reports Dg Chest 2 View  10/19/2015  CLINICAL DATA:  HTN, ESRD, dialysis patient, Type II diabetes, CHF EXAM: CHEST  2 VIEW COMPARISON:  10/16/2015 FINDINGS: Mild  to moderate enlargement of the cardiopericardial silhouette, stable. No mediastinal or hilar masses or evidence of adenopathy. Right internal jugular tunneled dual lumen central venous catheter is stable with its tip in the lower superior vena cava just above the caval atrial junction. Lungs are essentially clear. No evidence of pulmonary edema or pneumonia. No pleural effusion or pneumothorax. Skeletal structures are unremarkable. IMPRESSION: 1. No acute cardiopulmonary disease. Electronically Signed   By: Lajean Manes M.D.   On: 10/19/2015 21:37   Dg Chest 2 View  10/16/2015  CLINICAL DATA:  Chest pain with shortness of breath. Dialysis 3 days ago. Recent hospitalization. EXAM: CHEST  2 VIEW COMPARISON:  10/06/2015 and 09/23/2015. FINDINGS: 1546 hours. Right IJ dialysis catheters are unchanged at the SVC right atrial level. The mediastinal contours and cardiomegaly appear unchanged. There is mild chronic vascular congestion without overt pulmonary edema, confluent airspace opacity or significant pleural effusion. The bones appear unchanged. IMPRESSION: Cardiomegaly with chronic vascular congestion. No edema or other definite acute findings. Electronically Signed   By: Richardean Sale M.D.   On: 10/16/2015 15:58   Dg Chest 2 View  10/06/2015  CLINICAL DATA:  Acute onset of mid back pain and generalized weakness. Shortness of breath, nausea and vomiting. Initial encounter. EXAM: CHEST  2 VIEW COMPARISON:  Chest radiograph performed 09/23/2015 FINDINGS: The lungs are  well-aerated. Vascular congestion is noted. Mildly increased interstitial markings could reflect minimal interstitial edema. There is no evidence of pleural effusion or pneumothorax. The heart is enlarged. A right-sided dual-lumen catheter is noted ending about the cavoatrial junction. No acute osseous abnormalities are seen. IMPRESSION: Vascular congestion and cardiomegaly. Mildly increased interstitial markings could reflect minimal interstitial edema. Electronically Signed   By: Garald Balding M.D.   On: 10/06/2015 03:04   Dg Abd Acute W/chest  10/29/2015  CLINICAL DATA:  Nausea and vomiting for several days. EXAM: DG ABDOMEN ACUTE W/ 1V CHEST COMPARISON:  10/19/2015 FINDINGS: There is a right jugular central line with tip in the low SVC. There is moderate unchanged cardiomegaly. Mild chronic appearing interstitial coarsening is present, unchanged. No consolidation. No large effusion. The abdominal gas pattern is negative for obstruction or perforation. There is a generous volume of stool throughout the colon. No biliary or urinary calculi are evident. IMPRESSION: Negative abdominal radiographs. No acute cardiopulmonary disease. Unchanged cardiomegaly. Electronically Signed   By: Andreas Newport M.D.   On: 10/29/2015 03:15   Ct Renal Stone Study  10/30/2015  CLINICAL DATA:  Back pain. Nausea vomiting 2 days. Dialysis patient. EXAM: CT ABDOMEN AND PELVIS WITHOUT CONTRAST TECHNIQUE: Multidetector CT imaging of the abdomen and pelvis was performed following the standard protocol without IV contrast. COMPARISON:  CT abdomen pelvis 03/11/2015 FINDINGS: Lower chest: Cardiac enlargement with moderate pericardial effusion. Lung bases clear Hepatobiliary: The liver is mildly enlarged. No focal liver lesion. Gallbladder and bile ducts normal. Pancreas: Negative Spleen: Negative Adrenals/Urinary Tract: Left midpole renal mass measures 39 x 42 mm and shows significant interval growth since the prior study. Limited  evaluation without intravenous contrast however this appears to be a solid lesion and is likely renal cell carcinoma. Atrophy of both kidneys without hydronephrosis. No renal calculi. Urinary bladder is collapsed. Renal transplant in the left pelvis unchanged from the prior study. Limited evaluation without intravenous contrast. Stomach/Bowel: Negative for bowel obstruction. No bowel edema or mass. Appendix not well seen but no evidence of appendicitis. Negative for diverticulitis. Vascular/Lymphatic: Advanced atherosclerotic calcification in the aorta and iliac arteries. No aneurysm.  No lymphadenopathy. Reproductive: Negative Other: Negative for ascites. Musculoskeletal: Negative IMPRESSION: Left renal mass has enlarged since the prior CT and is consistent with carcinoma. Renal transplant left lower quadrant with limited evaluation without intravenous contrast. Moderate pericardial effusion Electronically Signed   By: Franchot Gallo M.D.   On: 10/30/2015 07:22   Ct Renal Abd W/wo  10/30/2015  CLINICAL DATA:  Left renal mass, abdominal pain for 2 days with nausea and vomiting. EXAM: CT ABDOMEN AND PELVIS WITHOUT AND WITH CONTRAST TECHNIQUE: Multidetector CT imaging of the abdomen and pelvis was performed following the standard protocol before and following the bolus administration of intravenous contrast. CONTRAST:  100 cc Isovue-300. COMPARISON:  10/30/2015 and 03/11/2015. FINDINGS: Lower chest: Lung bases show no acute findings. Heart is moderately enlarged. Small pericardial effusion. No pericardial effusion. Hepatobiliary: Liver is heterogeneous. Gallbladder is unremarkable. No biliary ductal dilatation. Pancreas: Negative. Spleen: Negative. Adrenals/Urinary Tract: Adrenal glands are unremarkable. Native kidneys are atrophic. Heterogeneous mass off the interpolar left kidney measures 3.5 cm (401/54), previously 1.9 cm on 03/11/2015. It measures 33 Hounsfield units on precontrast imaging and 53 Hounsfield  units on nephrographic phase imaging. Enhancement characteristics are most likely stunted by renal failure. Additional low-attenuation lesions in the kidneys measure up to 1.4 cm on the right, too small to characterize. Left renal transplant is partially imaged in the left iliac fossa. Stomach/Bowel: Stomach and visualized portions of the small bowel and colon are grossly unremarkable. Vascular/Lymphatic: Atherosclerotic calcification of the arterial vasculature without abdominal aortic aneurysm. Scattered lymph nodes are not enlarged by CT size criteria. Other: No free fluid. Mesenteries and peritoneum are grossly unremarkable. Musculoskeletal: No worrisome lytic or sclerotic lesions. IMPRESSION: 1. Enlarging left renal mass with mild enhancement, worrisome for renal cell carcinoma. Please see discussion above. 2. Small pericardial effusion. 3. Heterogeneity within the liver parenchyma may be due to passive congestion related to impaired cardiac function. Electronically Signed   By: Lorin Picket M.D.   On: 10/30/2015 11:54    CBC  Recent Labs Lab 10/29/15 0055 10/31/15 0914 11/02/15 0346  WBC 5.4 6.1 5.0  HGB 9.1* 7.8* 7.7*  HCT 27.8* 24.6* 24.6*  PLT 151 152 152  MCV 89.7 87.9 87.2  MCH 29.4 27.9 27.3  MCHC 32.7 31.7 31.3  RDW 16.7* 16.4* 16.2*    Chemistries   Recent Labs Lab 10/29/15 0055 10/31/15 0914 11/01/15 1017 11/02/15 0346  NA 138 133* 133* 132*  K 4.7 6.2* 5.2* 5.0  CL 102 99* 95* 96*  CO2 23 21* 25 24  GLUCOSE 96 80 99 89  BUN 30* 58* 28* 38*  CREATININE 7.85* 13.00* 8.45* 10.06*  CALCIUM 8.8* 8.5* 8.8* 8.6*  AST 52* 52*  --   --   ALT 14* 13*  --   --   ALKPHOS 112 92  --   --   BILITOT 0.7 0.9  --   --    ------------------------------------------------------------------------------------------------------------------ estimated creatinine clearance is 8.5 mL/min (by C-G formula based on Cr of  10.06). ------------------------------------------------------------------------------------------------------------------ No results for input(s): HGBA1C in the last 72 hours. ------------------------------------------------------------------------------------------------------------------ No results for input(s): CHOL, HDL, LDLCALC, TRIG, CHOLHDL, LDLDIRECT in the last 72 hours. ------------------------------------------------------------------------------------------------------------------ No results for input(s): TSH, T4TOTAL, T3FREE, THYROIDAB in the last 72 hours.  Invalid input(s): FREET3 ------------------------------------------------------------------------------------------------------------------  Recent Labs  11/02/15 0346  FERRITIN 328  TIBC 210*  IRON 28*    Coagulation profile  Recent Labs Lab 10/30/15 0757 10/31/15 0914 11/01/15 0539 11/02/15 0346  INR 1.40 1.62* 2.06*  2.30*    No results for input(s): DDIMER in the last 72 hours.  Cardiac Enzymes No results for input(s): CKMB, TROPONINI, MYOGLOBIN in the last 168 hours.  Invalid input(s): CK ------------------------------------------------------------------------------------------------------------------ Invalid input(s): POCBNP  No results for input(s): GLUCAP in the last 72 hours.   RAI,RIPUDEEP M.D. [facility] 11/02/2015, 12:37 PM  Pager: [phone] Between 7am to 7pm - call Pager - [phone]  After 7pm go to [email or web address] - password [email or web address]  Call night coverage person covering after 7pm

## 2015-11-02 NOTE — Progress Notes (Signed)
PT Cancellation Note  Patient Details Name: Frank [name] MRN: [id number] DOB: February 23, 1966   Cancelled Treatment:    Reason Eval/Treat Not Completed: Patient declined, no reason specified.   Cassell Clement, PT, CSCS Pager [phone] Office [phone]  11/02/2015, 3:21 PM

## 2015-11-03 LAB — OPIATES,MS,WB/SP RFX
6-ACETYLMORPHINE: NEGATIVE
Codeine: NEGATIVE ng/mL
DIHYDROCODEINE: NEGATIVE ng/mL
HYDROMORPHONE: NEGATIVE ng/mL
Hydrocodone: NEGATIVE ng/mL
MORPHINE: NEGATIVE ng/mL
Opiate Confirmation: NEGATIVE

## 2015-11-03 LAB — DRUG SCREEN 10 W/CONF, SERUM
Amphetamines, IA: NEGATIVE ng/mL
BARBITURATES, IA: NEGATIVE ug/mL
Benzodiazepines, IA: NEGATIVE ng/mL
Cocaine & Metabolite, IA: NEGATIVE ng/mL
METHADONE, IA: NEGATIVE ng/mL
OXYCODONES, IA: NEGATIVE ng/mL
Opiates, IA: NEGATIVE ng/mL
PHENCYCLIDINE, IA: NEGATIVE ng/mL
Propoxyphene, IA: NEGATIVE ng/mL
THC(MARIJUANA) METABOLITE, IA: POSITIVE ng/mL

## 2015-11-03 LAB — OXYCODONES,MS,WB/SP RFX
OXYCODONES CONFIRMATION: NEGATIVE
Oxycocone: NEGATIVE ng/mL
Oxymorphone: NEGATIVE ng/mL

## 2015-11-03 LAB — PROTIME-INR
INR: 2.88 — AB (ref 0.00–1.49)
PROTHROMBIN TIME: 29.7 s — AB (ref 11.6–15.2)

## 2015-11-03 MED ORDER — WARFARIN SODIUM 7.5 MG PO TABS
7.5000 mg | ORAL_TABLET | Freq: Once | ORAL | Status: AC
  Administered 2015-11-03: 7.5 mg via ORAL
  Filled 2015-11-03: qty 1

## 2015-11-03 MED ORDER — CYCLOBENZAPRINE HCL 10 MG PO TABS: 10.0000 mg | ORAL_TABLET | Freq: Three times a day (TID) | ORAL | Status: DC | PRN

## 2015-11-03 MED ORDER — OXYCODONE-ACETAMINOPHEN 5-325 MG PO TABS
1.0000 | ORAL_TABLET | ORAL | Status: DC | PRN
Start: 2015-11-03 — End: 2015-11-04
  Administered 2015-11-03 – 2015-11-04 (×8): 2 via ORAL
  Filled 2015-11-03 (×7): qty 2

## 2015-11-03 MED ORDER — HYDROMORPHONE HCL 1 MG/ML IJ SOLN
0.5000 mg | Freq: Once | INTRAMUSCULAR | Status: AC
  Administered 2015-11-03: 0.5 mg via INTRAVENOUS
  Filled 2015-11-03: qty 1

## 2015-11-03 NOTE — Progress Notes (Signed)
OT Cancellation Note  Patient Details Name: Frank [name] MRN: [id number] DOB: 10-09-1965   Cancelled Treatment:    Reason Eval/Treat Not Completed: OT screened, no needs identified, will sign off  Malka So 11/03/2015, 1:05 PM

## 2015-11-03 NOTE — Progress Notes (Signed)
PT Cancellation Note  Patient Details Name: Frank [name] MRN: [id number] DOB: 1965-10-11   Cancelled Treatment:     Pt refused to get OOB at this time.  Talked with pt and nursing - nursing had report that pt walked OK.  Pt denies previous falls.  Pt denies problems with walking - says he will get up and walk in halls later today.  Screen complete and no skilled needs or follow up identified.     Loyal Buba 11/03/2015, 2:24 PM

## 2015-11-03 NOTE — Progress Notes (Signed)
ANTICOAGULATION CONSULT NOTE - Follow Up Consult  Pharmacy Consult for Coumadin Indication: hx PE  Allergies  Allergen Reactions  . Butalbital-Apap-Caffeine Shortness Of Breath and Swelling    Swelling in throat  . Ferrlecit [Na Ferric Gluc Cplx In Sucrose] Shortness Of Breath, Swelling and Other (See Comments)    Swelling in throat  . Minoxidil Shortness Of Breath  . Darvocet [Propoxyphene N-Acetaminophen] Hives    Patient Measurements: Height: 6' 2" (188 cm) Weight: 156 lb 3.2 oz (70.852 kg) IBW/kg (Calculated) : 82.2   Vital Signs: Temp: 97.9 F (36.6 C) (04/14 0354) Temp Source: Oral (04/14 0354) BP: 127/88 mmHg (04/14 0612) Pulse Rate: 81 (04/14 0354)  Labs:  Recent Labs  11/01/15 0539 11/01/15 1017 11/02/15 0346 11/03/15 0515  HGB  --   --  7.7*  --   HCT  --   --  24.6*  --   PLT  --   --  152  --   LABPROT 23.1*  --  25.0* 29.7*  INR 2.06*  --  2.30* 2.88*  CREATININE  --  8.45* 10.06*  --     Estimated Creatinine Clearance: 8.7 mL/min (by C-G formula based on Cr of 10.06).  Assessment: 49yom continues on coumadin for hx chronic PE. INR subtherapeutic on admit and he received 2 days of 38m.  INR is now at goal today 2.8 today. Given rise overnight will give lower dose tonight but expect to discharge back on home dose of 978mdaily.  Home dose: 54m97maily  Goal of Therapy:  INR 2-3 Monitor platelets by anticoagulation protocol: Yes   Plan:  1) Coumadin 7.5 mg x 1 2) Daily INR  FraErin HearingarmD., BCPS Clinical Pharmacist Pager [phone]/2017 10:18 AM

## 2015-11-03 NOTE — Progress Notes (Signed)
Pt c/o of severe chronic back pain. Pt upset that he only has 1 tab Norco ordered for pain PRN. Pt verbally aggressive demanding something else for pain. Paged on call [facility] and PA Lauren Harduk ordered 500 mg robaxin.

## 2015-11-03 NOTE — Progress Notes (Signed)
Pt slept from about 11pm until 2:15am. Pt woke up to use the restroom and called out asking for more pain medication. No other PRN med was due at the moment. Pt verbally aggressive stating that someone needs to get him some pain medication or he is leaving AMA. He stated, "I will leave and go to [facility] ER where they take better care of me and will give me the meds I need." Pt also states, " I can leave and go home and my back hurt there. I should not be at the hospital with my back in pain." Pt refused his labs at 2:22am. Pt upset and requests a doctor come and speak with him. Pt stated that he is fine when he sleeps, but when he wakes up he is in pain.  I paged the on call [facility] PA, Lauren Harduk making her aware of the patients behavior. She ordered a one time dose of 0.33m Dilaudid IV for pain. Pt pain med administered. Pt resting in bed. Will continue to monitor.

## 2015-11-03 NOTE — Progress Notes (Signed)
Offered assistance with bath, Pt stated he will do bath later. Tech gave bath and shaving supplies to Pt.

## 2015-11-03 NOTE — Progress Notes (Signed)
[facility]                                                                              Patient Demographics  Frank [name], is a 50 y.o. male, DOB - 1965/09/12, YJW:[id number]  Admit date - 10/30/2015   Admitting Physician Waldemar Dickens, MD  Outpatient Primary MD for the patient is [name] Patient  LOS - 4  days    Chief Complaint  Patient presents with  . Nausea  . Back Pain       Brief HPI    49/M with ESRD, non compliance, chronic underdialysis per Renal, chronic pain, drug seeking behavior, frequent admissions, recently left AMA on 3/28 after an admission for nonspecific chest pain. Since that time he has been evaluated in the ER 5 times (including for this admission): He was evaluated on 3/30 for medical clearance prior to incarceration, he presented in police custody; on 0/37 his blood pressure was markedly elevated at 199/136 and once again had been brought in by the police for medical clearance prior to re-incarceration; back again on 4/1 apparently for dialysis while incarcerated. On 4/10 here with Nausea, emesis and back pain. K was 6.2, creatinine 13, volume overloaded and noted to have enlarging L renal mass concerning for RCC. Urology was consulted, recommended outpt FU for nephrectomy Volume overloaded and getting Dialyzed per renal   Assessment & Plan   Left renal mass -likely RCC, seen by Urology-Dr.Dahlstedt -recommended Outpatient Follow-up for nephrectomy  Non compliance/drug seeking behavior:  -Patient has a long history of noncompliance, narcotic seeking behavior -chronically on narcotics, continue vicodin, no indication for IV pain meds.  - Patient overnight was acting up and demanding IV pain meds. I had a long discussion with the patient today and he is nearing discharge, hence no indication of starting IV pain medications at this time and he needs to have better control with oral pain medications. Patient then requested if he can be  changed to Percocet instead of Norco, which I have changed and also added Flexeril for muscle spasms. Patient is agreeable with current plan. He states that he will not have a ride for outpatient hemodialysis tomorrow hence he will have inpatient HD tomorrow and DC home after that.    Nausea/vomiting- none, patient was eating breakfast at the time of my examination this morning -suspect uremia related, from long standing under dialysis due to non compliance -HD per Renal -Tolerating diet without any difficulty, although reporting unwitnessed vomiting episodes - CT abdomen/renal stone study had shown enlarging left renal mass, consistent with carcinoma, no bowel obstruction or acute intra-abdominal pathology.   ESRD on dialysis- Tues, Thurs, Sat in [place], nephro: [name] -non compliant with HD per Renal -volume overloaded chronically due to under dialysis/non compliance - CT abdomen also showed moderate pericardial effusion likely due to volume overload.   Chronic combined systolic and diastolic CHF -compensated, volume management with HD -CT abdomen had also shown moderate pericardial effusion likely due to volume overload - 2-D echo 4/11 showed EF of 35-40%, diffuse hypokinesis, PA pressure 52 ( echo 2/17 had shown EF of 20-25% with  grade 2 diastolic dysfunction).   Chronic pulmonary embolism  -warfarin per pharmacy, INR was subtherapeutic on admission   Anemia of chronic disease -no overt bleeding -monitor CBC, needs EPO with HD, transfuse if trends down further   Dyslipidemia -Continue Lipitor  Code Status: Full code  Family Communication: Discussed in detail with the patient, all imaging results, lab results explained to the patient   Disposition Plan: DC home in a.m. after hemodialysis  Time Spent in minutes 25 minutes  Procedures  HD  Consults   renal  DVT Prophylaxis  SCD's  Medications  Scheduled Meds: . amLODipine  10 mg Oral Daily  . atorvastatin   40 mg Oral q1800  . bisacodyl  10 mg Rectal Once  . carvedilol  12.5 mg Oral BID WC  . cinacalcet  30 mg Oral Q lunch  . darbepoetin (ARANESP) injection - DIALYSIS  100 mcg Intravenous Q Tue-HD  . diphenhydrAMINE  25 mg Oral Once per day on Mon Thu  . docusate sodium  100 mg Oral BID  . doxercalciferol  2.5 mcg Intravenous Q T,Th,Sa-HD  . ferumoxytol  510 mg Intravenous Once per day on Mon Thu  . folic acid  1 mg Oral Daily  . gabapentin  100 mg Oral TID  . hydrALAZINE  100 mg Oral 3 times per day  . isosorbide mononitrate  90 mg Oral Daily  . lisinopril  20 mg Oral Daily  . multivitamin  1 tablet Oral QHS  . multivitamin with minerals  1 tablet Oral Daily  . pantoprazole  40 mg Oral Daily  . sevelamer carbonate  1,600 mg Oral TID WC  . sodium chloride flush  3 mL Intravenous Q12H  . thiamine  100 mg Oral Daily   Or  . thiamine  100 mg Intravenous Daily  . warfarin  7.5 mg Oral ONCE-1800  . Warfarin - Pharmacist Dosing Inpatient   Does not apply q1800   Continuous Infusions:  PRN Meds:.acetaminophen **OR** acetaminophen, ALPRAZolam, cyclobenzaprine, hydrOXYzine, labetalol, levalbuterol, ondansetron **OR** ondansetron (ZOFRAN) IV, oxyCODONE-acetaminophen, sevelamer carbonate   Antibiotics   Anti-infectives    None        Subjective:   Frank [name] was seen and examined today.  Continues to complain of back pain and specifically asking for IV pain medications/narcotics. Overnight events noted, patient demanding IV pain medications. Patient denies dizziness, chest pain, shortness of breath, abdominal pain, new weakness, numbess, tingling.  No documented vomiting episodes, patient eating solid breakfast at the time of my examination today.  Objective:   Filed Vitals:   11/02/15 2147 11/03/15 0354 11/03/15 0612 11/03/15 1030  BP: 147/88 132/77 127/88 136/86  Pulse:  81  85  Temp:  97.9 F (36.6 C)  98.2 F (36.8 C)  TempSrc:  Oral  Oral  Resp:  18  18  Height:        Weight:  70.852 kg (156 lb 3.2 oz)    SpO2:  97%  93%    Intake/Output Summary (Last 24 hours) at 11/03/15 1204 Last data filed at 11/03/15 0846  Gross per 24 hour  Intake    853 ml  Output      0 ml  Net    853 ml     Wt Readings from Last 3 Encounters:  11/03/15 70.852 kg (156 lb 3.2 oz)  10/29/15 73.936 kg (163 lb)  10/20/15 74.844 kg (165 lb)     Exam  General: Alert and oriented x 3, NAD  HEENT:  Neck : Supple no JVD   CVS: S1 S2 clear, RRR  Respiratory: CTAB  Abdomen: Soft, nontender, nondistended, + bowel sounds, multiple abdominal scars, no rebound or guarding   Ext: no cyanosis clubbing , 1+ edema  Neuro: no new deficits   Skin: No rashes  Psych: Normal affect and demeanor, alert and oriented x3    Data Reviewed:  I have personally reviewed following labs and imaging studies  Micro Results No results found for this or any previous visit (from the past 240 hour(s)).  Radiology Reports Dg Chest 2 View  10/19/2015  CLINICAL DATA:  HTN, ESRD, dialysis patient, Type II diabetes, CHF EXAM: CHEST  2 VIEW COMPARISON:  10/16/2015 FINDINGS: Mild to moderate enlargement of the cardiopericardial silhouette, stable. No mediastinal or hilar masses or evidence of adenopathy. Right internal jugular tunneled dual lumen central venous catheter is stable with its tip in the lower superior vena cava just above the caval atrial junction. Lungs are essentially clear. No evidence of pulmonary edema or pneumonia. No pleural effusion or pneumothorax. Skeletal structures are unremarkable. IMPRESSION: 1. No acute cardiopulmonary disease. Electronically Signed   By: Lajean Manes M.D.   On: 10/19/2015 21:37   Dg Chest 2 View  10/16/2015  CLINICAL DATA:  Chest pain with shortness of breath. Dialysis 3 days ago. Recent hospitalization. EXAM: CHEST  2 VIEW COMPARISON:  10/06/2015 and 09/23/2015. FINDINGS: 1546 hours. Right IJ dialysis catheters are unchanged at the SVC right atrial  level. The mediastinal contours and cardiomegaly appear unchanged. There is mild chronic vascular congestion without overt pulmonary edema, confluent airspace opacity or significant pleural effusion. The bones appear unchanged. IMPRESSION: Cardiomegaly with chronic vascular congestion. No edema or other definite acute findings. Electronically Signed   By: Richardean Sale M.D.   On: 10/16/2015 15:58   Dg Chest 2 View  10/06/2015  CLINICAL DATA:  Acute onset of mid back pain and generalized weakness. Shortness of breath, nausea and vomiting. Initial encounter. EXAM: CHEST  2 VIEW COMPARISON:  Chest radiograph performed 09/23/2015 FINDINGS: The lungs are well-aerated. Vascular congestion is noted. Mildly increased interstitial markings could reflect minimal interstitial edema. There is no evidence of pleural effusion or pneumothorax. The heart is enlarged. A right-sided dual-lumen catheter is noted ending about the cavoatrial junction. No acute osseous abnormalities are seen. IMPRESSION: Vascular congestion and cardiomegaly. Mildly increased interstitial markings could reflect minimal interstitial edema. Electronically Signed   By: Garald Balding M.D.   On: 10/06/2015 03:04   Dg Abd Acute W/chest  10/29/2015  CLINICAL DATA:  Nausea and vomiting for several days. EXAM: DG ABDOMEN ACUTE W/ 1V CHEST COMPARISON:  10/19/2015 FINDINGS: There is a right jugular central line with tip in the low SVC. There is moderate unchanged cardiomegaly. Mild chronic appearing interstitial coarsening is present, unchanged. No consolidation. No large effusion. The abdominal gas pattern is negative for obstruction or perforation. There is a generous volume of stool throughout the colon. No biliary or urinary calculi are evident. IMPRESSION: Negative abdominal radiographs. No acute cardiopulmonary disease. Unchanged cardiomegaly. Electronically Signed   By: Andreas Newport M.D.   On: 10/29/2015 03:15   Ct Renal Stone  Study  10/30/2015  CLINICAL DATA:  Back pain. Nausea vomiting 2 days. Dialysis patient. EXAM: CT ABDOMEN AND PELVIS WITHOUT CONTRAST TECHNIQUE: Multidetector CT imaging of the abdomen and pelvis was performed following the standard protocol without IV contrast. COMPARISON:  CT abdomen pelvis 03/11/2015 FINDINGS: Lower chest: Cardiac enlargement with moderate pericardial effusion. Lung bases  clear Hepatobiliary: The liver is mildly enlarged. No focal liver lesion. Gallbladder and bile ducts normal. Pancreas: Negative Spleen: Negative Adrenals/Urinary Tract: Left midpole renal mass measures 39 x 42 mm and shows significant interval growth since the prior study. Limited evaluation without intravenous contrast however this appears to be a solid lesion and is likely renal cell carcinoma. Atrophy of both kidneys without hydronephrosis. No renal calculi. Urinary bladder is collapsed. Renal transplant in the left pelvis unchanged from the prior study. Limited evaluation without intravenous contrast. Stomach/Bowel: Negative for bowel obstruction. No bowel edema or mass. Appendix not well seen but no evidence of appendicitis. Negative for diverticulitis. Vascular/Lymphatic: Advanced atherosclerotic calcification in the aorta and iliac arteries. No aneurysm. No lymphadenopathy. Reproductive: Negative Other: Negative for ascites. Musculoskeletal: Negative IMPRESSION: Left renal mass has enlarged since the prior CT and is consistent with carcinoma. Renal transplant left lower quadrant with limited evaluation without intravenous contrast. Moderate pericardial effusion Electronically Signed   By: Franchot Gallo M.D.   On: 10/30/2015 07:22   Ct Renal Abd W/wo  10/30/2015  CLINICAL DATA:  Left renal mass, abdominal pain for 2 days with nausea and vomiting. EXAM: CT ABDOMEN AND PELVIS WITHOUT AND WITH CONTRAST TECHNIQUE: Multidetector CT imaging of the abdomen and pelvis was performed following the standard protocol before and  following the bolus administration of intravenous contrast. CONTRAST:  100 cc Isovue-300. COMPARISON:  10/30/2015 and 03/11/2015. FINDINGS: Lower chest: Lung bases show no acute findings. Heart is moderately enlarged. Small pericardial effusion. No pericardial effusion. Hepatobiliary: Liver is heterogeneous. Gallbladder is unremarkable. No biliary ductal dilatation. Pancreas: Negative. Spleen: Negative. Adrenals/Urinary Tract: Adrenal glands are unremarkable. Native kidneys are atrophic. Heterogeneous mass off the interpolar left kidney measures 3.5 cm (401/54), previously 1.9 cm on 03/11/2015. It measures 33 Hounsfield units on precontrast imaging and 53 Hounsfield units on nephrographic phase imaging. Enhancement characteristics are most likely stunted by renal failure. Additional low-attenuation lesions in the kidneys measure up to 1.4 cm on the right, too small to characterize. Left renal transplant is partially imaged in the left iliac fossa. Stomach/Bowel: Stomach and visualized portions of the small bowel and colon are grossly unremarkable. Vascular/Lymphatic: Atherosclerotic calcification of the arterial vasculature without abdominal aortic aneurysm. Scattered lymph nodes are not enlarged by CT size criteria. Other: No free fluid. Mesenteries and peritoneum are grossly unremarkable. Musculoskeletal: No worrisome lytic or sclerotic lesions. IMPRESSION: 1. Enlarging left renal mass with mild enhancement, worrisome for renal cell carcinoma. Please see discussion above. 2. Small pericardial effusion. 3. Heterogeneity within the liver parenchyma may be due to passive congestion related to impaired cardiac function. Electronically Signed   By: Lorin Picket M.D.   On: 10/30/2015 11:54    CBC  Recent Labs Lab 10/29/15 0055 10/31/15 0914 11/02/15 0346  WBC 5.4 6.1 5.0  HGB 9.1* 7.8* 7.7*  HCT 27.8* 24.6* 24.6*  PLT 151 152 152  MCV 89.7 87.9 87.2  MCH 29.4 27.9 27.3  MCHC 32.7 31.7 31.3  RDW 16.7*  16.4* 16.2*    Chemistries   Recent Labs Lab 10/29/15 0055 10/31/15 0914 11/01/15 1017 11/02/15 0346  NA 138 133* 133* 132*  K 4.7 6.2* 5.2* 5.0  CL 102 99* 95* 96*  CO2 23 21* 25 24  GLUCOSE 96 80 99 89  BUN 30* 58* 28* 38*  CREATININE 7.85* 13.00* 8.45* 10.06*  CALCIUM 8.8* 8.5* 8.8* 8.6*  AST 52* 52*  --   --   ALT 14* 13*  --   --  ALKPHOS 112 92  --   --   BILITOT 0.7 0.9  --   --    ------------------------------------------------------------------------------------------------------------------ estimated creatinine clearance is 8.7 mL/min (by C-G formula based on Cr of 10.06). ------------------------------------------------------------------------------------------------------------------ No results for input(s): HGBA1C in the last 72 hours. ------------------------------------------------------------------------------------------------------------------ No results for input(s): CHOL, HDL, LDLCALC, TRIG, CHOLHDL, LDLDIRECT in the last 72 hours. ------------------------------------------------------------------------------------------------------------------ No results for input(s): TSH, T4TOTAL, T3FREE, THYROIDAB in the last 72 hours.  Invalid input(s): FREET3 ------------------------------------------------------------------------------------------------------------------  Recent Labs  11/02/15 0346  FERRITIN 328  TIBC 210*  IRON 28*    Coagulation profile  Recent Labs Lab 10/30/15 0757 10/31/15 0914 11/01/15 0539 11/02/15 0346 11/03/15 0515  INR 1.40 1.62* 2.06* 2.30* 2.88*    No results for input(s): DDIMER in the last 72 hours.  Cardiac Enzymes No results for input(s): CKMB, TROPONINI, MYOGLOBIN in the last 168 hours.  Invalid input(s): CK ------------------------------------------------------------------------------------------------------------------ Invalid input(s): POCBNP  No results for input(s): GLUCAP in the last 72  hours.   Delaine Canter M.D. [facility] 11/03/2015, 12:04 PM  Pager: [phone] Between 7am to 7pm - call Pager - [phone]  After 7pm go to [email or web address] - password [email or web address]  Call night coverage person covering after 7pm

## 2015-11-03 NOTE — Progress Notes (Signed)
Subjective:  BP is better- HD yest- removed 3 liters- did well - Patient acting out overnight- demanding pain meds Objective Vital signs in last 24 hours: Filed Vitals:   11/02/15 1922 11/02/15 2147 11/03/15 0354 11/03/15 0612  BP: 129/79 147/88 132/77 127/88  Pulse: 88  81   Temp: 98.9 F (37.2 C)  97.9 F (36.6 C)   TempSrc: Oral  Oral   Resp: 20  18   Height:      Weight:   70.852 kg (156 lb 3.2 oz)   SpO2: 97%  97%    Weight change: -3.575 kg (-7 lb 14.1 oz)  Intake/Output Summary (Last 24 hours) at 11/03/15 1011 Last data filed at 11/03/15 0846  Gross per 24 hour  Intake    853 ml  Output   3000 ml  Net  -2147 ml    Assessment: 50 year old noncompliant HD patient presenting with abdominal/back pain and also imaging revealing a renal mass and a pericardial effusion  1 ESRD- notoriously noncompliant in past. Normally TTS in [place].  he tells me his is trying to do better. Continue HD TTS via [facility]- - says he missed his eval for an access.new  EDW maybe around 69  kg.  2 Volume overload- I think resolved BP excellent on coreg/hydralazine and lisinopril and new EDW 3 Non-adherence to dialysis- could certainly cause a uremic pericardial effusion- compliance with HD will help  4. Renal mass- has been known in the past. Seems unlikely that it would be causing pain unless bleeding into it and there does not seem to be clinical evidence of that. Have to wonder about secondary gain of complaining of pain that necessitates repeated trips to the ER and narcotics. Would eventually need kidney removed - urology has been consulted  5. bones - will continue him on his home Renvela, Hectorol and Sensipar  6. Anemia - have added  Aranesp - iron low- given feraheme 7.  Pain seeking/comfort seeking- long standing- eventually I feel he will get bored and sign out AMA- does not need to stay inpatient for my issues- thinking that he will go home tomorrow after HD    Frank [name]  A    Labs: Basic Metabolic Panel:  Recent Labs Lab 10/31/15 0914 11/01/15 1017 11/02/15 0346  NA 133* 133* 132*  K 6.2* 5.2* 5.0  CL 99* 95* 96*  CO2 21* 25 24  GLUCOSE 80 99 89  BUN 58* 28* 38*  CREATININE 13.00* 8.45* 10.06*  CALCIUM 8.5* 8.8* 8.6*   Liver Function Tests:  Recent Labs Lab 10/29/15 0055 10/31/15 0914  AST 52* 52*  ALT 14* 13*  ALKPHOS 112 92  BILITOT 0.7 0.9  PROT 7.9 7.3  ALBUMIN 2.7* 2.8*    Recent Labs Lab 10/29/15 0055 10/30/15 0620  LIPASE 89* 52*   No results for input(s): AMMONIA in the last 168 hours. CBC:  Recent Labs Lab 10/29/15 0055 10/31/15 0914 11/02/15 0346  WBC 5.4 6.1 5.0  HGB 9.1* 7.8* 7.7*  HCT 27.8* 24.6* 24.6*  MCV 89.7 87.9 87.2  PLT 151 152 152   Cardiac Enzymes: No results for input(s): CKTOTAL, CKMB, CKMBINDEX, TROPONINI in the last 168 hours. CBG:  Recent Labs Lab 10/30/15 0944  GLUCAP 78    Iron Studies:   Recent Labs  11/02/15 0346  IRON 28*  TIBC 210*  FERRITIN 328   Studies/Results: No results found. Medications: Infusions:    Scheduled Medications: . amLODipine  10 mg Oral  Daily  . atorvastatin  40 mg Oral q1800  . bisacodyl  10 mg Rectal Once  . carvedilol  12.5 mg Oral BID WC  . cinacalcet  30 mg Oral Q lunch  . darbepoetin (ARANESP) injection - DIALYSIS  100 mcg Intravenous Q Tue-HD  . diphenhydrAMINE  25 mg Oral Once per day on Mon Thu  . docusate sodium  100 mg Oral BID  . doxercalciferol  2.5 mcg Intravenous Q T,Th,Sa-HD  . ferumoxytol  510 mg Intravenous Once per day on Mon Thu  . folic acid  1 mg Oral Daily  . gabapentin  100 mg Oral TID  . hydrALAZINE  100 mg Oral 3 times per day  . isosorbide mononitrate  90 mg Oral Daily  . lisinopril  20 mg Oral Daily  . multivitamin  1 tablet Oral QHS  . multivitamin with minerals  1 tablet Oral Daily  . pantoprazole  40 mg Oral Daily  . sevelamer carbonate  1,600 mg Oral TID WC  . sodium chloride flush  3 mL Intravenous  Q12H  . thiamine  100 mg Oral Daily   Or  . thiamine  100 mg Intravenous Daily  . warfarin  9 mg Oral q1800  . Warfarin - Pharmacist Dosing Inpatient   Does not apply q1800    have reviewed scheduled and prn medications.  Physical Exam: General: calm Heart: RRR Lungs: moslty clear Abdomen: soft, non tender incision with scant amount of light colored fluid- not frank pus Extremities: no peripheral edema Dialysis Access: PC     11/03/2015,10:11 AM  LOS: 4 days

## 2015-11-04 LAB — RENAL FUNCTION PANEL
ALBUMIN: 2.4 g/dL — AB (ref 3.5–5.0)
ANION GAP: 9 (ref 5–15)
BUN: 29 mg/dL — ABNORMAL HIGH (ref 6–20)
CALCIUM: 8.4 mg/dL — AB (ref 8.9–10.3)
CO2: 28 mmol/L (ref 22–32)
Chloride: 99 mmol/L — ABNORMAL LOW (ref 101–111)
Creatinine, Ser: 9.16 mg/dL — ABNORMAL HIGH (ref 0.61–1.24)
GFR calc Af Amer: 7 mL/min — ABNORMAL LOW (ref >60)
GFR calc non Af Amer: 6 mL/min — ABNORMAL LOW (ref >60)
GLUCOSE: 98 mg/dL (ref 65–99)
PHOSPHORUS: 3.3 mg/dL (ref 2.5–4.6)
Potassium: 4.6 mmol/L (ref 3.5–5.1)
SODIUM: 136 mmol/L (ref 135–145)

## 2015-11-04 LAB — CBC
HCT: 23.7 % — ABNORMAL LOW (ref 39.0–52.0)
HEMOGLOBIN: 7.4 g/dL — AB (ref 13.0–17.0)
MCH: 27.4 pg (ref 26.0–34.0)
MCHC: 31.2 g/dL (ref 30.0–36.0)
MCV: 87.8 fL (ref 78.0–100.0)
Platelets: 200 10*3/uL (ref 150–400)
RBC: 2.7 MIL/uL — AB (ref 4.22–5.81)
RDW: 16 % — ABNORMAL HIGH (ref 11.5–15.5)
WBC: 5.3 10*3/uL (ref 4.0–10.5)

## 2015-11-04 LAB — PROTIME-INR
INR: 3.77 — AB (ref 0.00–1.49)
PROTHROMBIN TIME: 36.4 s — AB (ref 11.6–15.2)

## 2015-11-04 MED ORDER — HEPARIN SODIUM (PORCINE) 1000 UNIT/ML DIALYSIS: 20.0000 [IU]/kg | INTRAMUSCULAR | Status: DC | PRN

## 2015-11-04 MED ORDER — OXYCODONE-ACETAMINOPHEN 5-325 MG PO TABS: 1.0000 | ORAL_TABLET | ORAL | Status: DC | PRN

## 2015-11-04 MED ORDER — OXYCODONE-ACETAMINOPHEN 5-325 MG PO TABS
ORAL_TABLET | ORAL | Status: AC
  Filled 2015-11-04: qty 2

## 2015-11-04 MED ORDER — DOXERCALCIFEROL 4 MCG/2ML IV SOLN
INTRAVENOUS | Status: AC
  Filled 2015-11-04: qty 2

## 2015-11-04 MED ORDER — ALPRAZOLAM 0.5 MG PO TABS: 0.5000 mg | ORAL_TABLET | Freq: Two times a day (BID) | ORAL | Status: DC | PRN

## 2015-11-04 MED ORDER — GABAPENTIN 100 MG PO CAPS: 100.0000 mg | ORAL_CAPSULE | Freq: Three times a day (TID) | ORAL | Status: DC

## 2015-11-04 MED ORDER — CARVEDILOL 12.5 MG PO TABS: 12.5000 mg | ORAL_TABLET | Freq: Two times a day (BID) | ORAL | Status: DC

## 2015-11-04 MED ORDER — HYDROXYZINE HCL 25 MG PO TABS
ORAL_TABLET | ORAL | Status: AC
  Filled 2015-11-04: qty 2

## 2015-11-04 MED ORDER — TRAMADOL HCL 50 MG PO TABS: 50.0000 mg | ORAL_TABLET | Freq: Four times a day (QID) | ORAL | Status: DC | PRN

## 2015-11-04 MED ORDER — CYCLOBENZAPRINE HCL 10 MG PO TABS: 10.0000 mg | ORAL_TABLET | Freq: Three times a day (TID) | ORAL | Status: DC | PRN

## 2015-11-04 NOTE — Discharge Summary (Signed)
Pt got discharged to home, discharge instructions provided and patient showed understanding to it, IV taken out,Telemonitor DC,pt left unit  ambulatory with all of the belongings .

## 2015-11-04 NOTE — Progress Notes (Signed)
Subjective:  Seen in HD- BP remains good  Objective Vital signs in last 24 hours: Filed Vitals:   11/04/15 0728 11/04/15 0736 11/04/15 0739 11/04/15 0800  BP: 125/58   140/90  Pulse: 85 85 87 80  Temp: 98.3 F (36.8 C)     TempSrc: Oral     Resp: _0 Height:      Weight: 73.8 kg (162 lb 11.2 oz)     SpO2: 98%      Weight change: 3.757 kg (8 lb 4.5 oz)  Intake/Output Summary (Last 24 hours) at 11/04/15 8413 Last data filed at 11/04/15 0400  Gross per 24 hour  Intake   1200 ml  Output      0 ml  Net   1200 ml    Assessment: 50 year old noncompliant HD patient presenting with abdominal/back pain and also imaging revealing a renal mass and a pericardial effusion  1 ESRD- notoriously noncompliant in past. Normally TTS in [place].  he tells me his is trying to do better. Continue HD TTS via PC- - says he missed his eval for an access.new  EDW maybe around 69-70  kg.  2 Volume overload- I think resolved BP excellent on coreg/hydralazine and lisinopril and new EDW 3 Non-adherence to dialysis- could certainly cause a uremic pericardial effusion- compliance with HD will help  4. Renal mass- has been known in the past. Seems unlikely that it would be causing pain unless bleeding into it and there does not seem to be clinical evidence of that. Have to wonder about secondary gain of complaining of pain that necessitates repeated trips to the ER and narcotics. Would eventually need kidney removed - urology has been consulted  5. bones - will continue him on his home Renvela, Hectorol and Sensipar  6. Anemia - have added  Aranesp - iron low- given feraheme which he did tolerate so will communicate that to his HD unit 7.  Pain seeking/comfort seeking- long standing-  thinking that he will go home today after HD    Shalanda Brogden A    Labs: Basic Metabolic Panel:  Recent Labs Lab 11/01/15 1017 11/02/15 0346 11/04/15 0754  NA 133* 132* 136  K 5.2* 5.0 4.6   CL 95* 96* 99*  CO2 _1 GLUCOSE 99 89 98  BUN 28* 38* 29*  CREATININE 8.45* 10.06* 9.16*  CALCIUM 8.8* 8.6* 8.4*  PHOS  --   --  3.3   Liver Function Tests:  Recent Labs Lab 10/29/15 0055 10/31/15 0914 11/04/15 0754  AST 52* 52*  --   ALT 14* 13*  --   ALKPHOS 112 92  --   BILITOT 0.7 0.9  --   PROT 7.9 7.3  --   ALBUMIN 2.7* 2.8* 2.4*    Recent Labs Lab 10/29/15 0055 10/30/15 0620  LIPASE 89* 52*   No results for input(s): AMMONIA in the last 168 hours. CBC:  Recent Labs Lab 10/29/15 0055 10/31/15 0914 11/02/15 0346 11/04/15 0753  WBC 5.4 6.1 5.0 5.3  HGB 9.1* 7.8* 7.7* 7.4*  HCT 27.8* 24.6* 24.6* 23.7*  MCV 89.7 87.9 87.2 87.8  PLT 151 152 152 200   Cardiac Enzymes: No results for input(s): CKTOTAL, CKMB, CKMBINDEX, TROPONINI in the last 168 hours. CBG:  Recent Labs Lab 10/30/15 0944  GLUCAP 78    Iron Studies:   Recent Labs  11/02/15 0346  IRON 28*  TIBC 210*  FERRITIN 328   Studies/Results:  No results found. Medications: Infusions:    Scheduled Medications: . amLODipine  10 mg Oral Daily  . atorvastatin  40 mg Oral q1800  . bisacodyl  10 mg Rectal Once  . carvedilol  12.5 mg Oral BID WC  . cinacalcet  30 mg Oral Q lunch  . darbepoetin (ARANESP) injection - DIALYSIS  100 mcg Intravenous Q Tue-HD  . diphenhydrAMINE  25 mg Oral Once per day on Mon Thu  . docusate sodium  100 mg Oral BID  . doxercalciferol  2.5 mcg Intravenous Q T,Th,Sa-HD  . ferumoxytol  510 mg Intravenous Once per day on Mon Thu  . folic acid  1 mg Oral Daily  . gabapentin  100 mg Oral TID  . hydrALAZINE  100 mg Oral 3 times per day  . isosorbide mononitrate  90 mg Oral Daily  . lisinopril  20 mg Oral Daily  . multivitamin  1 tablet Oral QHS  . multivitamin with minerals  1 tablet Oral Daily  . pantoprazole  40 mg Oral Daily  . sevelamer carbonate  1,600 mg Oral TID WC  . sodium chloride flush  3 mL Intravenous Q12H  . thiamine  100 mg Oral Daily    Or  . thiamine  100 mg Intravenous Daily  . Warfarin - Pharmacist Dosing Inpatient   Does not apply q1800    have reviewed scheduled and prn medications.  Physical Exam: General: calm Heart: RRR Lungs: moslty clear Abdomen: soft, non tender  Extremities: no peripheral edema Dialysis Access: PC     11/04/2015,9:09 AM  LOS: 5 days

## 2015-11-04 NOTE — Procedures (Signed)
Patient was seen on dialysis and the procedure was supervised.  BFR 350  Via PC BP is  140/90.   Patient appears to be tolerating treatment well  Frank [name] A 11/04/2015

## 2015-11-04 NOTE — Discharge Summary (Signed)
Physician Discharge Summary   Patient ID: Frank [name] MRN: [id number] DOB/AGE: [birth year] 50 y.o.  Admit date: 10/30/2015 Discharge date: 11/04/2015  Primary Care Physician:  [name] Patient  Discharge Diagnoses:   . Left renal mass . ESRD on TTS . Chronic combined systolic and diastolic CHF (congestive heart failure) ([place]) . Accelerated hypertension . Chronic pulmonary embolism ([place]) . Dyslipidemia . Anemia of chronic disease . Left renal mass . Constipation . Hypertensive urgency . Drug-seeking behavior  Consults:   Nephrology Urology   Recommendations for Outpatient Follow-up:  1. Patient was recommended to follow outpatient with urology, Dr Luberta Robertson for the appointment and timing of surgery/nephrectomy 2. Please repeat CBC/BMET at next visit   DIET: Heart healthy diet    Allergies:   Allergies  Allergen Reactions  . Butalbital-Apap-Caffeine Shortness Of Breath and Swelling    Swelling in throat  . Ferrlecit [Na Ferric Gluc Cplx In Sucrose] Shortness Of Breath, Swelling and Other (See Comments)    Swelling in throat  . Minoxidil Shortness Of Breath  . Darvocet [Propoxyphene N-Acetaminophen] Hives     DISCHARGE MEDICATIONS: Current Discharge Medication List    START taking these medications   Details  oxyCODONE-acetaminophen (PERCOCET/ROXICET) 5-325 MG tablet Take 1-2 tablets by mouth every 4 (four) hours as needed for moderate pain or severe pain. Qty: 30 tablet, Refills: 0      CONTINUE these medications which have CHANGED   Details  ALPRAZolam (XANAX) 0.5 MG tablet Take 1 tablet (0.5 mg total) by mouth 2 (two) times daily as needed for anxiety. Qty: 15 tablet, Refills: 0    carvedilol (COREG) 12.5 MG tablet Take 1 tablet (12.5 mg total) by mouth 2 (two) times daily with a meal. Qty: 60 tablet, Refills: 0    cyclobenzaprine (FLEXERIL) 10 MG tablet Take 1 tablet (10 mg total) by mouth 3 (three) times daily as needed for muscle spasms. Qty: 60  tablet, Refills: 0    gabapentin (NEURONTIN) 100 MG capsule Take 1 capsule (100 mg total) by mouth 3 (three) times daily. Qty: 90 capsule, Refills: 0    traMADol (ULTRAM) 50 MG tablet Take 1 tablet (50 mg total) by mouth every 6 (six) hours as needed for moderate pain. Qty: 15 tablet, Refills: 0      CONTINUE these medications which have NOT CHANGED   Details  amLODipine (NORVASC) 10 MG tablet Take 10 mg by mouth daily.    atorvastatin (LIPITOR) 40 MG tablet Take 1 tablet (40 mg total) by mouth daily at 6 PM. Qty: 30 tablet, Refills: 1    cinacalcet (SENSIPAR) 30 MG tablet Take 30 mg by mouth daily.    docusate sodium (COLACE) 100 MG capsule Take 1 capsule (100 mg total) by mouth 2 (two) times daily. Qty: 60 capsule, Refills: 0    doxercalciferol (HECTOROL) 4 MCG/2ML injection Inject 1.25 mLs (2.5 mcg total) into the vein Every Tuesday,Thursday,and Saturday with dialysis. Qty: 2 mL    hydrALAZINE (APRESOLINE) 50 MG tablet Take 2 tablets (100 mg total) by mouth every 8 (eight) hours. Qty: 90 tablet, Refills: 1    hydrocerin (EUCERIN) CREA Apply 1 application topically 2 (two) times daily. Qty: 454 g, Refills: 0    hydrOXYzine (ATARAX/VISTARIL) 10 MG tablet Take 1 tablet (10 mg total) by mouth 3 (three) times daily as needed for itching. Qty: 30 tablet, Refills: 0    ibuprofen (ADVIL,MOTRIN) 600 MG tablet Take 1 tablet (600 mg total) by mouth every 8 (eight) hours as  needed. Qty: 15 tablet, Refills: 0    isosorbide mononitrate (IMDUR) 30 MG 24 hr tablet Take 3 tablets (90 mg total) by mouth daily. Qty: 90 tablet, Refills: 0    levalbuterol (XOPENEX HFA) 45 MCG/ACT inhaler Inhale 2 puffs into the lungs every 6 (six) hours as needed for wheezing or shortness of breath. Qty: 1 Inhaler, Refills: 2    lisinopril (PRINIVIL,ZESTRIL) 20 MG tablet Take 1 tablet (20 mg total) by mouth daily. Qty: 30 tablet, Refills: 1    multivitamin (RENA-VIT) TABS tablet Take 1 tablet by mouth at  bedtime. Qty: 30 tablet, Refills: 1    omeprazole (PRILOSEC) 20 MG capsule Take 20 mg by mouth daily.    predniSONE (DELTASONE) 5 MG tablet Take 5 mg by mouth daily with breakfast.    sevelamer carbonate (RENVELA) 800 MG tablet Take 800-1,600 mg by mouth 3 (three) times daily with meals. 2 tabs three times daily with meals, and 1 tablet with snacks    warfarin (COUMADIN) 6 MG tablet Take 1.5 tablets (9 mg total) by mouth daily at 6 PM. Take daily until adjusted per MD Qty: 20 tablet, Refills: 0    ondansetron (ZOFRAN) 4 MG tablet Take 1 tablet (4 mg total) by mouth every 6 (six) hours. Qty: 12 tablet, Refills: 0      STOP taking these medications     HYDROcodone-acetaminophen (NORCO/VICODIN) 5-325 MG tablet      methocarbamol (ROBAXIN) 500 MG tablet          Brief H and P: For complete details please refer to admission H and P, but in brief 50/M with ESRD, non compliance, chronic underdialysis per Renal, chronic pain, drug seeking behavior, frequent admissions, recently left AMA on 3/28 after an admission for nonspecific chest pain. Since that time he has been evaluated in the ER 5 times (including for this admission): He was evaluated on 3/30 for medical clearance prior to incarceration, he presented in police custody; on 5/40 his blood pressure was markedly elevated at 199/136 and once again had been brought in by the police for medical clearance prior to re-incarceration; back again on 4/1 apparently for dialysis while incarcerated. On 4/10 here with Nausea, emesis and back pain. K was 6.2, creatinine 13, volume overloaded and noted to have enlarging L renal mass concerning for RCC. Urology was consulted, recommended outpt FU for nephrectomy   Hospital Course:   Left renal mass -likely RCC, seen by Urology-Dr.Dahlstedt, recommended Outpatient Follow-up for nephrectomy. Patient was strongly recommended to follow-up-  Non compliance/drug seeking behavior: Chronic back  pain -Patient has a long history of noncompliance, narcotic seeking behavior -chronically on narcotics, during the admission, patient continued to demand for IV pain meds. Once placed on Percocet PRN instead of Norco per his request, also added Flexeril for muscle spasms, patient had remarkable improvement in his pain and felt ready for discharge after hemodialysis.   Nausea/vomiting- resolved -suspect uremia related, from long standing under dialysis due to non compliance -Nephrology was consulted and patient underwent hemodialysis per his schedule  -Tolerating diet without any difficulty. - CT abdomen/renal stone study had shown enlarging left renal mass, consistent with carcinoma, no bowel obstruction or acute intra-abdominal pathology.   ESRD on dialysis- Tues, Thurs, Sat in [place], nephro: [name] -non compliant with HD per Renal -volume overloaded chronically due to under dialysis/non compliance - CT abdomen also showed moderate pericardial effusion likely due to volume overload.   Chronic combined systolic and diastolic CHF -compensated, volume  management with HD -CT abdomen had also shown moderate pericardial effusion likely due to volume overload - 2-D echo 4/11 showed EF of 35-40%, diffuse hypokinesis, PA pressure 52 ( echo 2/17 had shown EF of 20-25% with grade 2 diastolic dysfunction).   Chronic pulmonary embolism  -Patient was placed on warfarin per pharmacy, INR 3.7 at the time of discharge.    Anemia of chronic disease -no overt bleeding -monitor CBC, needs EPO with HD, transfuse if trends down further   Dyslipidemia -Continue Lipitor   Day of Discharge BP 150/98 mmHg  Pulse 87  Temp(Src) 98 F (36.7 C) (Oral)  Resp 17  Ht _0  (1.88 m)  Wt 73.8 kg (162 lb 11.2 oz)  BMI 20.88 kg/m2  SpO2 98%  Physical Exam: General: Alert and awake oriented x3 not in any acute distress. HEENT: anicteric sclera, pupils reactive to light and accommodation CVS:  S1-S2 clear no murmur rubs or gallops Chest: clear to auscultation bilaterally, no wheezing rales or rhonchi Abdomen: soft nontender, nondistended, normal bowel sounds Extremities: no cyanosis, clubbing or edema noted bilaterally Neuro: Cranial nerves II-XII intact, no focal neurological deficits   The results of significant diagnostics from this hospitalization (including imaging, microbiology, ancillary and laboratory) are listed below for reference.    LAB RESULTS: Basic Metabolic Panel:  Recent Labs Lab 11/02/15 0346 11/04/15 0754  NA 132* 136  K 5.0 4.6  CL 96* 99*  CO2 24 28  GLUCOSE 89 98  BUN 38* 29*  CREATININE 10.06* 9.16*  CALCIUM 8.6* 8.4*  PHOS  --  3.3   Liver Function Tests:  Recent Labs Lab 10/29/15 0055 10/31/15 0914 11/04/15 0754  AST 52* 52*  --   ALT 14* 13*  --   ALKPHOS 112 92  --   BILITOT 0.7 0.9  --   PROT 7.9 7.3  --   ALBUMIN 2.7* 2.8* 2.4*    Recent Labs Lab 10/29/15 0055 10/30/15 0620  LIPASE 89* 52*   No results for input(s): AMMONIA in the last 168 hours. CBC:  Recent Labs Lab 11/02/15 0346 11/04/15 0753  WBC 5.0 5.3  HGB 7.7* 7.4*  HCT 24.6* 23.7*  MCV 87.2 87.8  PLT 152 200   Cardiac Enzymes: No results for input(s): CKTOTAL, CKMB, CKMBINDEX, TROPONINI in the last 168 hours. BNP: Invalid input(s): POCBNP CBG:  Recent Labs Lab 10/30/15 0944  GLUCAP 78    Significant Diagnostic Studies:  Ct Renal Stone Study  10/30/2015  CLINICAL DATA:  Back pain. Nausea vomiting 2 days. Dialysis patient. EXAM: CT ABDOMEN AND PELVIS WITHOUT CONTRAST TECHNIQUE: Multidetector CT imaging of the abdomen and pelvis was performed following the standard protocol without IV contrast. COMPARISON:  CT abdomen pelvis 03/11/2015 FINDINGS: Lower chest: Cardiac enlargement with moderate pericardial effusion. Lung bases clear Hepatobiliary: The liver is mildly enlarged. No focal liver lesion. Gallbladder and bile ducts normal. Pancreas:  Negative Spleen: Negative Adrenals/Urinary Tract: Left midpole renal mass measures 39 x 42 mm and shows significant interval growth since the prior study. Limited evaluation without intravenous contrast however this appears to be a solid lesion and is likely renal cell carcinoma. Atrophy of both kidneys without hydronephrosis. No renal calculi. Urinary bladder is collapsed. Renal transplant in the left pelvis unchanged from the prior study. Limited evaluation without intravenous contrast. Stomach/Bowel: Negative for bowel obstruction. No bowel edema or mass. Appendix not well seen but no evidence of appendicitis. Negative for diverticulitis. Vascular/Lymphatic: Advanced atherosclerotic calcification in the aorta and iliac arteries.  No aneurysm. No lymphadenopathy. Reproductive: Negative Other: Negative for ascites. Musculoskeletal: Negative IMPRESSION: Left renal mass has enlarged since the prior CT and is consistent with carcinoma. Renal transplant left lower quadrant with limited evaluation without intravenous contrast. Moderate pericardial effusion Electronically Signed   By: Franchot Gallo M.D.   On: 10/30/2015 07:22   Ct Renal Abd W/wo  10/30/2015  CLINICAL DATA:  Left renal mass, abdominal pain for 2 days with nausea and vomiting. EXAM: CT ABDOMEN AND PELVIS WITHOUT AND WITH CONTRAST TECHNIQUE: Multidetector CT imaging of the abdomen and pelvis was performed following the standard protocol before and following the bolus administration of intravenous contrast. CONTRAST:  100 cc Isovue-300. COMPARISON:  10/30/2015 and 03/11/2015. FINDINGS: Lower chest: Lung bases show no acute findings. Heart is moderately enlarged. Small pericardial effusion. No pericardial effusion. Hepatobiliary: Liver is heterogeneous. Gallbladder is unremarkable. No biliary ductal dilatation. Pancreas: Negative. Spleen: Negative. Adrenals/Urinary Tract: Adrenal glands are unremarkable. Native kidneys are atrophic. Heterogeneous mass off  the interpolar left kidney measures 3.5 cm (401/54), previously 1.9 cm on 03/11/2015. It measures 33 Hounsfield units on precontrast imaging and 53 Hounsfield units on nephrographic phase imaging. Enhancement characteristics are most likely stunted by renal failure. Additional low-attenuation lesions in the kidneys measure up to 1.4 cm on the right, too small to characterize. Left renal transplant is partially imaged in the left iliac fossa. Stomach/Bowel: Stomach and visualized portions of the small bowel and colon are grossly unremarkable. Vascular/Lymphatic: Atherosclerotic calcification of the arterial vasculature without abdominal aortic aneurysm. Scattered lymph nodes are not enlarged by CT size criteria. Other: No free fluid. Mesenteries and peritoneum are grossly unremarkable. Musculoskeletal: No worrisome lytic or sclerotic lesions. IMPRESSION: 1. Enlarging left renal mass with mild enhancement, worrisome for renal cell carcinoma. Please see discussion above. 2. Small pericardial effusion. 3. Heterogeneity within the liver parenchyma may be due to passive congestion related to impaired cardiac function. Electronically Signed   By: Lorin Picket M.D.   On: 10/30/2015 11:54    2D ECHO: Study Conclusions  - Left ventricle: The cavity size was normal. There was severe  concentric hypertrophy. Systolic function was moderately reduced.  The estimated ejection fraction was in the range of 35% to 40%.  Abnormal GLPSS at -11% with inferoseptal strain abnormality.  Diffuse hypokinesis. The study is not technically sufficient to  allow evaluation of LV diastolic function. - Mitral valve: Mildly thickened leaflets . There was mild  regurgitation. - Left atrium: Severely dilated at 63 ml/m2. - Right ventricle: RVH. The cavity size was moderately dilated.  Reduced systolic function. - Right atrium: Moderately dilated at 24 cm2. - Tricuspid valve: There was moderate regurgitation. - Pulmonary  arteries: Dilated. PA peak pressure: 52 mm Hg (S). - Inferior vena cava: The vessel was dilated. The respirophasic  diameter changes were blunted (< 50%), consistent with elevated  central venous pressure. - Pericardium, extracardiac: Small pericardial effusion. Cannot  exclude tamponade physiology. Clinical correlation is advised.  Impressions:  - Compared to a prior study in 08/2015, the EF has improved to  35-40%, global hypokinesis, severe LAE, moderate RAE, moderately  dilated and hypertrophied RV with reduced systolic function,  moderate TR, RVSP 52 mmHg, persistent small pericardial effusion.  Disposition and Follow-up: Discharge Instructions    Diet - low sodium heart healthy    Complete by:  As directed      Discharge instructions    Complete by:  As directed   Renal     Increase activity slowly  Complete by:  As directed      Increase activity slowly    Complete by:  As directed             DISPOSITION: home    DISCHARGE FOLLOW-UP Follow-up Information    Follow up with Jorja Loa, MD.   Specialty:  Urology   Why:  call once home from hospital to set up appt for discussion about removing your left kidney   Contact information:   [place] [address] [phone]       Follow up with PCP. Schedule an appointment as soon as possible for a visit in 1 week.      Follow up with DAHLSTEDT, Lillette Boxer, MD. Schedule an appointment as soon as possible for a visit in 1 week.   Specialty:  Urology   Why:  for hospital follow-up. Please call on Monday to set up the appt and the surgery    Contact information:   [place] [address] [phone]        Time spent on Discharge: 37mns   Signed:   Teckla Christiansen M.D. [facility] 11/04/2015, 1:32 PM Pager: [phone]

## 2015-11-04 NOTE — Progress Notes (Signed)
ANTICOAGULATION CONSULT NOTE - Follow Up Consult  Pharmacy Consult for Coumadin Indication: hx PE  Allergies  Allergen Reactions  . Butalbital-Apap-Caffeine Shortness Of Breath and Swelling    Swelling in throat  . Ferrlecit [Na Ferric Gluc Cplx In Sucrose] Shortness Of Breath, Swelling and Other (See Comments)    Swelling in throat  . Minoxidil Shortness Of Breath  . Darvocet [Propoxyphene N-Acetaminophen] Hives    Patient Measurements: Height: _0  (188 cm) Weight: 162 lb 11.2 oz (73.8 kg) IBW/kg (Calculated) : 82.2   Vital Signs: Temp: 98.3 F (36.8 C) (04/15 0728) Temp Source: Oral (04/15 0728) BP: 140/90 mmHg (04/15 0800) Pulse Rate: 80 (04/15 0800)  Labs:  Recent Labs  11/01/15 1017 11/02/15 0346 11/03/15 0515 11/04/15 0227 11/04/15 0753 11/04/15 0754  HGB  --  7.7*  --   --  7.4*  --   HCT  --  24.6*  --   --  23.7*  --   PLT  --  152  --   --  200  --   LABPROT  --  25.0* 29.7* 36.4*  --   --   INR  --  2.30* 2.88* 3.77*  --   --   CREATININE 8.45* 10.06*  --   --   --  9.16*    Estimated Creatinine Clearance: 10 mL/min (by C-G formula based on Cr of 9.16).  Assessment: 49yom continues on coumadin for hx chronic PE. INR subtherapeutic on admit and he received 2 days of 60m.  INR is now SUPERtherapeutic at 3.77 today. Given rise overnight will hold dose tonight. H/H stable, no bleeding noted   Home dose: 919mdaily  Goal of Therapy:  INR 2-3 Monitor platelets by anticoagulation protocol: Yes   Plan:  1) Hold tonight's coumadin dose  2) Daily INR  Paighton Godette C. MiLennox GrumblesPharmD Pharmacy Resident  Pager: [phone]/15/2017 9:10 AM

## 2015-11-07 DIAGNOSIS — D631 Anemia in chronic kidney disease: Secondary | ICD-10-CM | POA: Diagnosis not present

## 2015-11-07 DIAGNOSIS — N186 End stage renal disease: Secondary | ICD-10-CM | POA: Diagnosis not present

## 2015-11-07 DIAGNOSIS — N2581 Secondary hyperparathyroidism of renal origin: Secondary | ICD-10-CM | POA: Diagnosis not present

## 2015-11-07 DIAGNOSIS — Z418 Encounter for other procedures for purposes other than remedying health state: Secondary | ICD-10-CM | POA: Diagnosis not present

## 2015-11-07 DIAGNOSIS — Z7901 Long term (current) use of anticoagulants: Secondary | ICD-10-CM | POA: Diagnosis not present

## 2015-11-09 ENCOUNTER — Ambulatory Visit: Payer: Self-pay | Admitting: Cardiology

## 2015-11-09 IMAGING — US US SCROTUM
1 series · 14 of 25 positions shown · non-contrast
Comparison: 12/16/2014

CLINICAL DATA: Right testicular pain.  Rule out torsion.

EXAM:
SCROTAL ULTRASOUND
DOPPLER ULTRASOUND OF THE TESTICLES
TECHNIQUE: Complete ultrasound examination of the testicles, epididymis, and
other scrotal structures was performed. Color and spectral Doppler
ultrasound were also utilized to evaluate blood flow to the
testicles.

[Series 1: us scrotum · 0.06mm/px · 43 acquisitions, 14 frames shown]
[im 1/43]
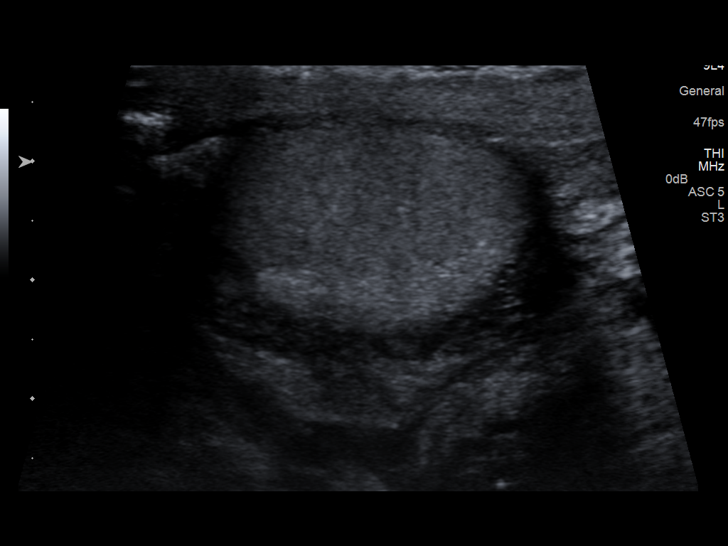
[im 4/43]
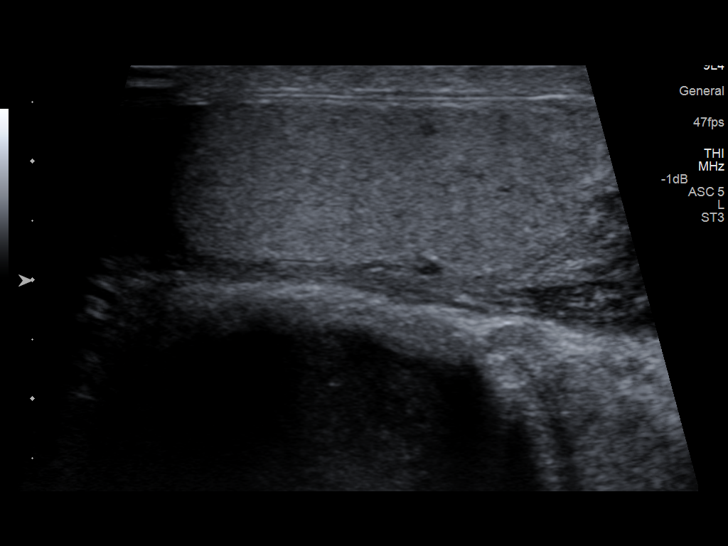
[im 8/43]
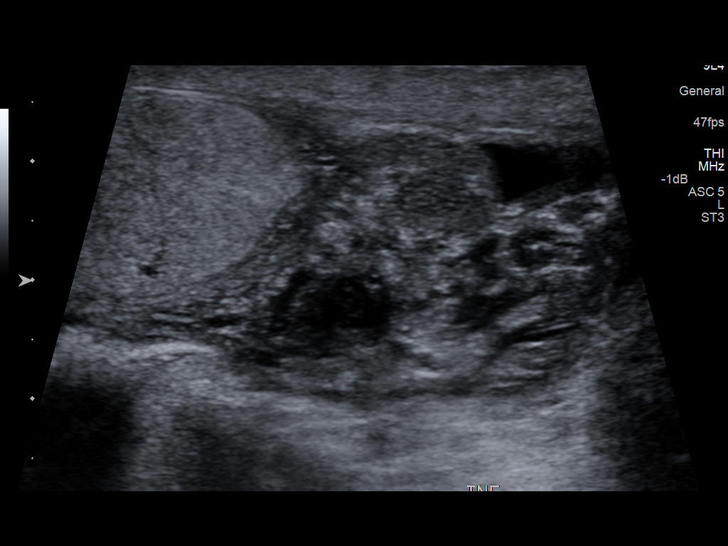
[im 11/43]
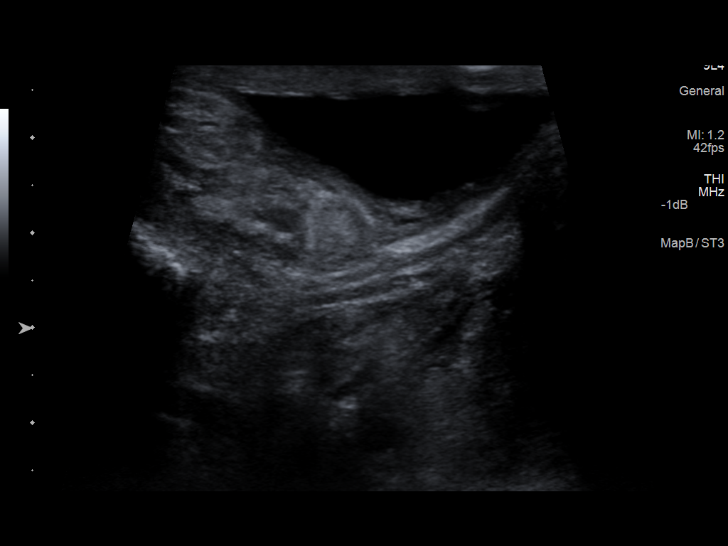
[im 15/43]
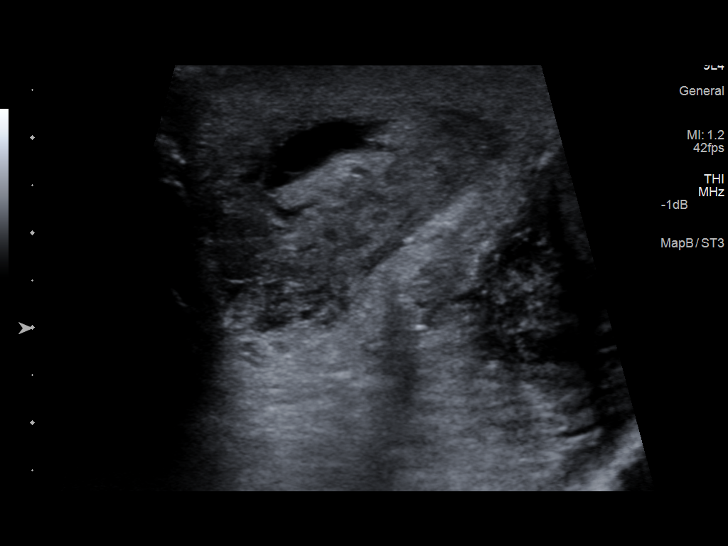
[im 16/43]
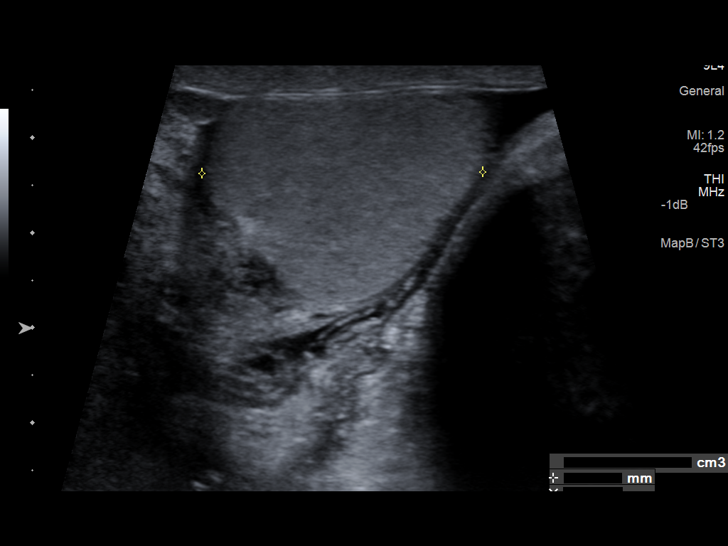
[im 20/43]
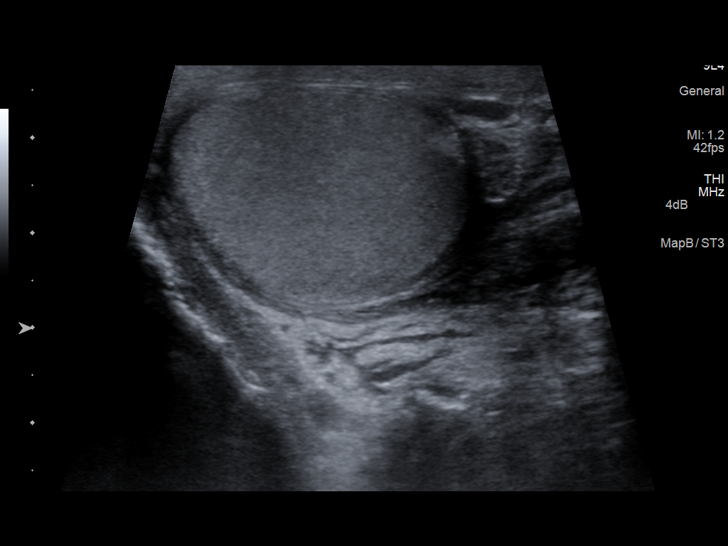
[im 23/43]
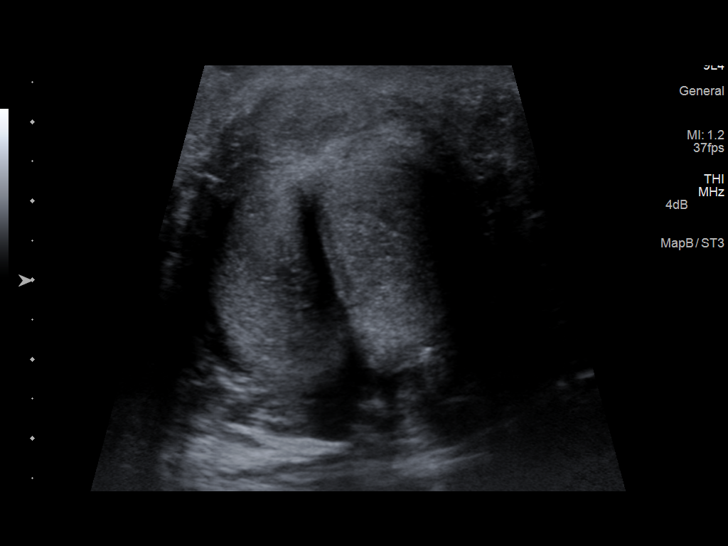
[im 27/43]
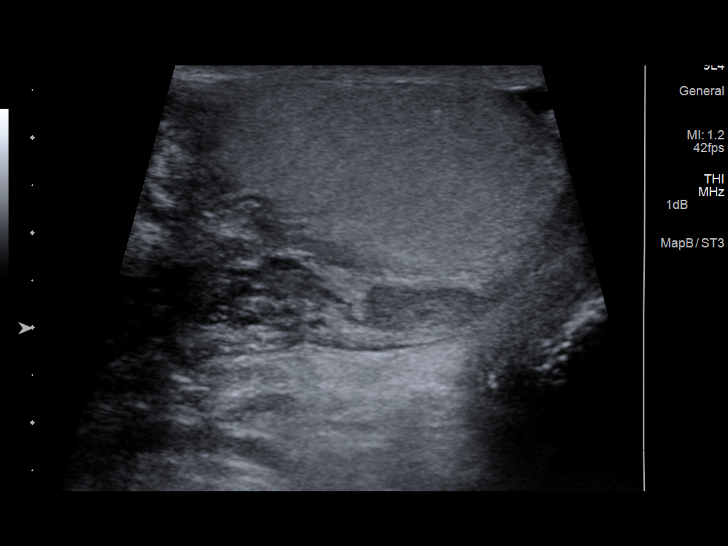
[im 29/43]
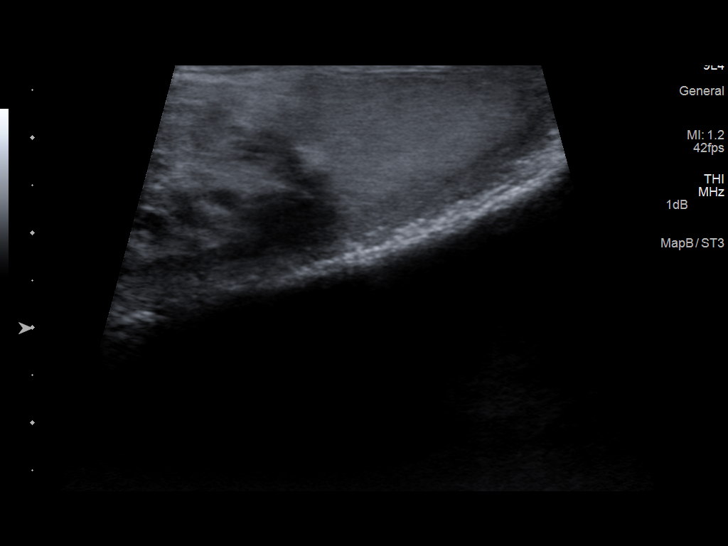
[im 32/43]
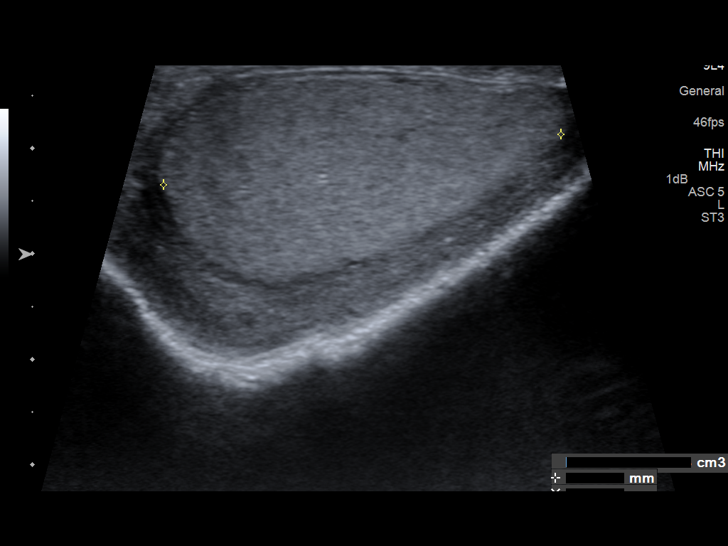
[im 36/43]
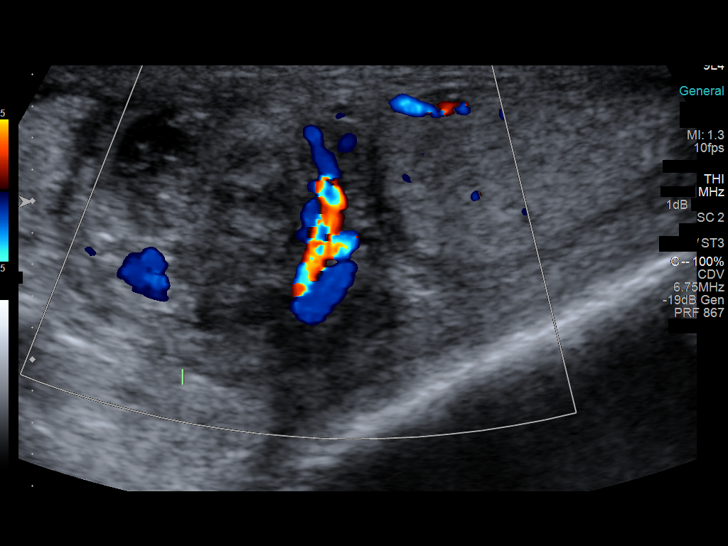
[im 39/43]
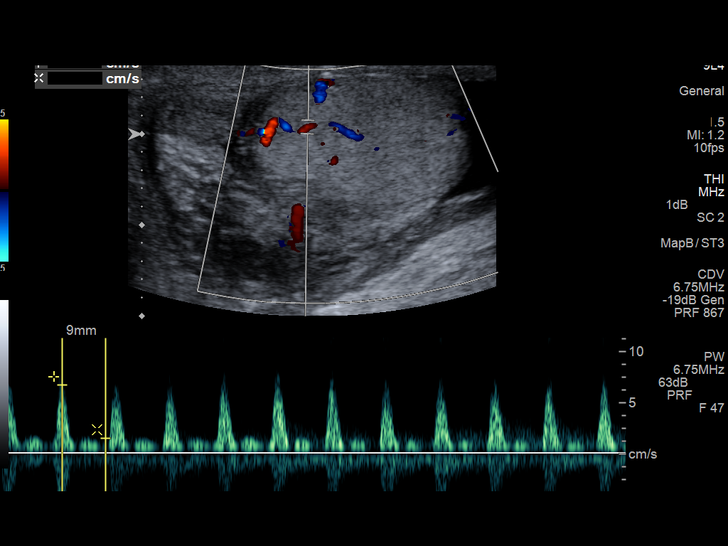
[im 43/43]
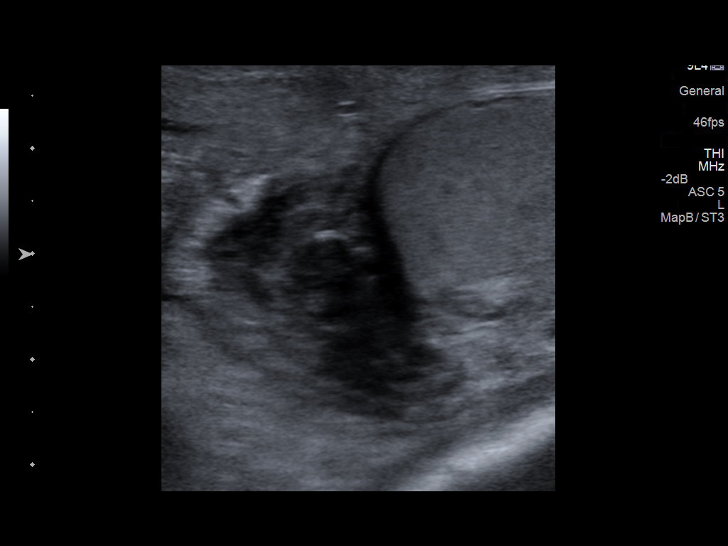

[14 of 25 positions shown; findings below may reference images not displayed]

FINDINGS: Right testicle

Measurements: 3.9 x 1.9 x 3 cm. No mass or microlithiasis
visualized. There is mild capsular deformation by the phlegmonous
changes around the epididymis.

Left testicle

Measurements: 3.2 x 1.9 x 3.8 cm. No mass or microlithiasis
visualized.

Right epididymis: Thickened and heterogeneous with hypervascularity.

Left epididymis:  Thickened and heterogeneous with hypervascularity.

Varicocele:  Not well established in the setting of inflammation.

Pulsed Doppler interrogation of both testes demonstrates normal low
resistance arterial and venous waveforms bilaterally.

The right inguinal canal cyst seen on the previous study was not
visualized.
IMPRESSION: 1. Negative for testicular torsion.
2. Bilateral epididymitis, also seen 12/16/2014.

## 2015-11-11 DIAGNOSIS — D631 Anemia in chronic kidney disease: Secondary | ICD-10-CM | POA: Diagnosis not present

## 2015-11-11 DIAGNOSIS — N186 End stage renal disease: Secondary | ICD-10-CM | POA: Diagnosis not present

## 2015-11-11 DIAGNOSIS — Z418 Encounter for other procedures for purposes other than remedying health state: Secondary | ICD-10-CM | POA: Diagnosis not present

## 2015-11-11 DIAGNOSIS — N2581 Secondary hyperparathyroidism of renal origin: Secondary | ICD-10-CM | POA: Diagnosis not present

## 2015-11-12 ENCOUNTER — Emergency Department (HOSPITAL_COMMUNITY)
Admission: EM | Admit: 2015-11-12 | Discharge: 2015-11-13 | Disposition: A | Discharge status: Home / Self Care | Payer: Medicare Other | Attending: Emergency Medicine | Admitting: Emergency Medicine

## 2015-11-12 ENCOUNTER — Encounter (HOSPITAL_COMMUNITY): Payer: Self-pay | Admitting: Nurse Practitioner

## 2015-11-12 DIAGNOSIS — R109 Unspecified abdominal pain: Secondary | ICD-10-CM | POA: Insufficient documentation

## 2015-11-12 DIAGNOSIS — M545 Low back pain, unspecified: Secondary | ICD-10-CM

## 2015-11-12 DIAGNOSIS — Z7952 Long term (current) use of systemic steroids: Secondary | ICD-10-CM | POA: Insufficient documentation

## 2015-11-12 DIAGNOSIS — Z862 Personal history of diseases of the blood and blood-forming organs and certain disorders involving the immune mechanism: Secondary | ICD-10-CM | POA: Insufficient documentation

## 2015-11-12 DIAGNOSIS — N186 End stage renal disease: Secondary | ICD-10-CM | POA: Diagnosis not present

## 2015-11-12 DIAGNOSIS — R34 Anuria and oliguria: Secondary | ICD-10-CM | POA: Insufficient documentation

## 2015-11-12 DIAGNOSIS — Z87891 Personal history of nicotine dependence: Secondary | ICD-10-CM | POA: Diagnosis not present

## 2015-11-12 DIAGNOSIS — E119 Type 2 diabetes mellitus without complications: Secondary | ICD-10-CM | POA: Diagnosis not present

## 2015-11-12 DIAGNOSIS — F329 Major depressive disorder, single episode, unspecified: Secondary | ICD-10-CM | POA: Diagnosis not present

## 2015-11-12 DIAGNOSIS — I120 Hypertensive chronic kidney disease with stage 5 chronic kidney disease or end stage renal disease: Secondary | ICD-10-CM | POA: Insufficient documentation

## 2015-11-12 DIAGNOSIS — I509 Heart failure, unspecified: Secondary | ICD-10-CM | POA: Insufficient documentation

## 2015-11-12 DIAGNOSIS — R6883 Chills (without fever): Secondary | ICD-10-CM | POA: Insufficient documentation

## 2015-11-12 DIAGNOSIS — R112 Nausea with vomiting, unspecified: Secondary | ICD-10-CM | POA: Diagnosis not present

## 2015-11-12 DIAGNOSIS — Z9889 Other specified postprocedural states: Secondary | ICD-10-CM | POA: Insufficient documentation

## 2015-11-12 DIAGNOSIS — Z7901 Long term (current) use of anticoagulants: Secondary | ICD-10-CM | POA: Insufficient documentation

## 2015-11-12 DIAGNOSIS — G8929 Other chronic pain: Secondary | ICD-10-CM | POA: Diagnosis not present

## 2015-11-12 DIAGNOSIS — R1031 Right lower quadrant pain: Secondary | ICD-10-CM | POA: Diagnosis not present

## 2015-11-12 DIAGNOSIS — F419 Anxiety disorder, unspecified: Secondary | ICD-10-CM | POA: Diagnosis not present

## 2015-11-12 DIAGNOSIS — Z79899 Other long term (current) drug therapy: Secondary | ICD-10-CM | POA: Insufficient documentation

## 2015-11-12 DIAGNOSIS — Z992 Dependence on renal dialysis: Secondary | ICD-10-CM | POA: Insufficient documentation

## 2015-11-12 LAB — COMPREHENSIVE METABOLIC PANEL
ALK PHOS: 138 U/L — AB (ref 38–126)
ALT: 14 U/L — AB (ref 17–63)
AST: 50 U/L — ABNORMAL HIGH (ref 15–41)
Albumin: 2.8 g/dL — ABNORMAL LOW (ref 3.5–5.0)
Anion gap: 14 (ref 5–15)
BUN: 48 mg/dL — ABNORMAL HIGH (ref 6–20)
CALCIUM: 8.9 mg/dL (ref 8.9–10.3)
CO2: 23 mmol/L (ref 22–32)
CREATININE: 12.13 mg/dL — AB (ref 0.61–1.24)
Chloride: 100 mmol/L — ABNORMAL LOW (ref 101–111)
GFR, EST AFRICAN AMERICAN: 5 mL/min — AB (ref >60)
GFR, EST NON AFRICAN AMERICAN: 4 mL/min — AB (ref >60)
Glucose, Bld: 98 mg/dL (ref 65–99)
Potassium: 5.4 mmol/L — ABNORMAL HIGH (ref 3.5–5.1)
Sodium: 137 mmol/L (ref 135–145)
Total Bilirubin: 0.8 mg/dL (ref 0.3–1.2)
Total Protein: 8.2 g/dL — ABNORMAL HIGH (ref 6.5–8.1)

## 2015-11-12 LAB — CBC
HCT: 26.9 % — ABNORMAL LOW (ref 39.0–52.0)
Hemoglobin: 8.4 g/dL — ABNORMAL LOW (ref 13.0–17.0)
MCH: 27.2 pg (ref 26.0–34.0)
MCHC: 31.2 g/dL (ref 30.0–36.0)
MCV: 87.1 fL (ref 78.0–100.0)
PLATELETS: 251 10*3/uL (ref 150–400)
RBC: 3.09 MIL/uL — AB (ref 4.22–5.81)
RDW: 15.9 % — AB (ref 11.5–15.5)
WBC: 7.4 10*3/uL (ref 4.0–10.5)

## 2015-11-12 LAB — PROTIME-INR
INR: 1.39 (ref 0.00–1.49)
PROTHROMBIN TIME: 17.2 s — AB (ref 11.6–15.2)

## 2015-11-12 MED ORDER — ONDANSETRON 4 MG PO TBDP
4.0000 mg | ORAL_TABLET | Freq: Once | ORAL | Status: AC
Start: 2015-11-12 — End: 2015-11-12
  Administered 2015-11-12: 4 mg via ORAL
  Filled 2015-11-12: qty 1

## 2015-11-12 MED ORDER — HYDROMORPHONE HCL 1 MG/ML IJ SOLN
1.0000 mg | Freq: Once | INTRAMUSCULAR | Status: AC
  Administered 2015-11-12: 1 mg via INTRAMUSCULAR
  Filled 2015-11-12: qty 1

## 2015-11-12 MED ORDER — WARFARIN SODIUM 6 MG PO TABS
9.0000 mg | ORAL_TABLET | Freq: Once | ORAL | Status: AC
  Administered 2015-11-12: 9 mg via ORAL
  Filled 2015-11-12: qty 1.5

## 2015-11-12 MED ORDER — HYDROMORPHONE HCL 1 MG/ML IJ SOLN
2.0000 mg | Freq: Once | INTRAMUSCULAR | Status: AC
  Administered 2015-11-12: 2 mg via INTRAMUSCULAR
  Filled 2015-11-12: qty 2

## 2015-11-12 MED ORDER — ONDANSETRON 4 MG PO TBDP
4.0000 mg | ORAL_TABLET | Freq: Once | ORAL | Status: AC
  Administered 2015-11-13: 4 mg via ORAL
  Filled 2015-11-12: qty 1

## 2015-11-12 MED ORDER — OXYCODONE-ACETAMINOPHEN 5-325 MG PO TABS
1.0000 | ORAL_TABLET | Freq: Once | ORAL | Status: AC
Start: 2015-11-12 — End: 2015-11-13
  Administered 2015-11-13: 1 via ORAL
  Filled 2015-11-12: qty 1

## 2015-11-12 NOTE — ED Notes (Signed)
He c/o 2 day history of lower back and abd pain. He reports n/v, chills. He denies bowel, bladder changes. He is alert, breathing easily

## 2015-11-12 NOTE — ED Provider Notes (Signed)
CSN: [id number]     Arrival date & time 11/12/15  1841 History   First MD Initiated Contact with Patient 11/12/15 2025     Chief Complaint  Patient presents with  . Abdominal Pain     (Consider location/radiation/quality/duration/timing/severity/associated sxs/prior Treatment) HPI Comments: Patient with history of end-stage renal disease, known left-sided renal mass likely renal cell carcinoma, previous PE and coumadin use -- presents with complaint of lower back pain as well as right-sided abdominal pain with vomiting. Patient states that his pain has been worse for 2 days. He states that he has not been able to eat or drink much for 2 days. He is currently out of pain medication at home. Back pain is similar to chronic back pain but worse in usual. Patient denies other warning symptoms of back pain including: fecal incontinence, night sweats, waking from sleep with back pain, unexplained fevers or weight loss,  IVDU, recent trauma. He does not make urine. Abdominal pain is right sided. No fevers. No diarrhea or blood in stool. States compliance with coumadin.    The history is provided by the patient and medical records.    Past Medical History  Diagnosis Date  . Hypertension   . Depression   . Complication of anesthesia     itching, sore throat  . Shortness of breath   . Anxiety   . CHF (congestive heart failure) ([place])   . Anemia   . Dialysis patient ([place])   . Type II diabetes mellitus (H[facility])     No history per patient, but remains under history as A1c would not be accurate given on dialysis  . ESRD (end stage renal disease) ([place])     due to HTN per patient, followed at [facility], s/p failed kidney transplant - dialysis Tue, Th, Sat  . Renal insufficiency    Past Surgical History  Procedure Laterality Date  . Kidney receipient  2006    failed and started HD in March 2014  . Capd insertion    . Capd removal    . Left heart catheterization with coronary angiogram N/A 09/02/2014      Procedure: LEFT HEART CATHETERIZATION WITH CORONARY ANGIOGRAM;  Surgeon: [name], MD;  Location: [facility] CATH LAB;  Service: Cardiovascular;  Laterality: N/A;  . Inguinal hernia repair Right 02/14/2015    Procedure: REPAIR INCARCERATED RIGHT INGUINAL HERNIA;  Surgeon: [name], MD;  Location: [place];  Service: General;  Laterality: Right;  . Insertion of dialysis catheter Right 09/23/2015    Procedure: exchange of Right internal Dialysis Catheter.;  Surgeon: [name], MD;  Location: [facility] OR;  Service: Vascular;  Laterality: Right;   Family History  Problem Relation Age of Onset  . Hypertension Other    Social History  Substance Use Topics  . Smoking status: Former Smoker -- 0.00 packs/day for 1 years    Types: Cigarettes  . Smokeless tobacco: Never Used     Comment: quit Jan 2014  . Alcohol Use: No    Review of Systems  Constitutional: Positive for chills. Negative for fever.  HENT: Negative for rhinorrhea and sore throat.   Eyes: Negative for redness.  Respiratory: Negative for cough.   Cardiovascular: Negative for chest pain.  Gastrointestinal: Positive for nausea and vomiting. Negative for abdominal pain and diarrhea.  Genitourinary: Positive for decreased urine volume (does not make urine).  Musculoskeletal: Positive for back pain. Negative for myalgias.  Skin: Negative for rash.  Neurological: Negative for headaches.  Allergies  Butalbital-apap-caffeine; Ferrlecit; Minoxidil; and Darvocet  Home Medications   Prior to Admission medications   Medication Sig Start Date End Date Taking? Authorizing Provider  ALPRAZolam [name]) 0.5 MG tablet Take 1 tablet (0.5 mg total) by mouth 2 (two) times daily as needed for anxiety. 11/04/15   [name], MD  amLODipine (NORVASC) 10 MG tablet Take 10 mg by mouth daily.    Historical Provider, MD  atorvastatin (LIPITOR) 40 MG tablet Take 1 tablet (40 mg total) by mouth daily at 6 PM. 09/02/14   Barton Dubois, MD   carvedilol (COREG) 12.5 MG tablet Take 1 tablet (12.5 mg total) by mouth 2 (two) times daily with a meal. 11/04/15   [name], MD  cinacalcet (SENSIPAR) 30 MG tablet Take 30 mg by mouth daily.    Historical Provider, MD  cyclobenzaprine (FLEXERIL) 10 MG tablet Take 1 tablet (10 mg total) by mouth 3 (three) times daily as needed for muscle spasms. 11/04/15   [name], MD  docusate sodium (COLACE) 100 MG capsule Take 1 capsule (100 mg total) by mouth 2 (two) times daily. 11/19/14   [name], MD  doxercalciferol (HECTOROL) 4 MCG/2ML injection Inject 1.25 mLs (2.5 mcg total) into the vein Every Tuesday,Thursday,and Saturday with dialysis. 10/10/14   [name], DO  gabapentin (NEURONTIN) 100 MG capsule Take 100 mg by mouth 3 (three) times daily. 06/14/15 10/30/15  Historical Provider, MD  gabapentin (NEURONTIN) 100 MG capsule Take 1 capsule (100 mg total) by mouth 3 (three) times daily. 11/04/15 03/21/16  [name], MD  hydrALAZINE (APRESOLINE) 50 MG tablet Take 2 tablets (100 mg total) by mouth every 8 (eight) hours. 09/11/15   [name], MD  hydrocerin (EUCERIN) CREA Apply 1 application topically 2 (two) times daily. 02/04/15   [name], MD  hydrOXYzine (ATARAX/VISTARIL) 10 MG tablet Take 1 tablet (10 mg total) by mouth 3 (three) times daily as needed for itching. 09/27/15   [name], MD  ibuprofen (ADVIL,MOTRIN) 600 MG tablet Take 1 tablet (600 mg total) by mouth every 8 (eight) hours as needed. 10/06/15   [name], MD  isosorbide mononitrate (IMDUR) 30 MG 24 hr tablet Take 3 tablets (90 mg total) by mouth daily. 09/11/15   [name], MD  levalbuterol [facility] HFA) 45 MCG/ACT inhaler Inhale 2 puffs into the lungs every 6 (six) hours as needed for wheezing or shortness of breath. 11/19/14   [name], MD  lisinopril (PRINIVIL,ZESTRIL) 20 MG tablet Take 1 tablet (20 mg total) by mouth daily. 02/04/15   [name], MD  multivitamin (RENA-VIT) TABS  tablet Take 1 tablet by mouth at bedtime. 02/04/15   [name], MD  omeprazole (PRILOSEC) 20 MG capsule Take 20 mg by mouth daily. 07/01/13   Historical Provider, MD  ondansetron (ZOFRAN) 4 MG tablet Take 1 tablet (4 mg total) by mouth every 6 (six) hours. 10/29/15   Orpah Greek, MD  oxyCODONE-acetaminophen (PERCOCET/ROXICET) 5-325 MG tablet Take 1-2 tablets by mouth every 4 (four) hours as needed for moderate pain or severe pain. 11/04/15   [name], MD  predniSONE (DELTASONE) 5 MG tablet Take 5 mg by mouth daily with breakfast.    Historical Provider, MD  sevelamer carbonate (RENVELA) 800 MG tablet Take 800-1,600 mg by mouth 3 (three) times daily with meals. 2 tabs three times daily with meals, and 1 tablet with snacks    Historical Provider,  MD  traMADol (ULTRAM) 50 MG tablet Take 1 tablet (50 mg total) by mouth every 6 (six) hours as needed for moderate pain. 11/04/15   [name], MD  warfarin (COUMADIN) 6 MG tablet Take 1.5 tablets (9 mg total) by mouth daily at 6 PM. Take daily until adjusted per MD 09/12/15   [name], MD   BP 169/120 mmHg  Pulse 80  Temp(Src) 98.4 F (36.9 C) (Oral)  Resp 18  Ht _0  (1.88 m)  Wt 72.122 kg  BMI 20.41 kg/m2  SpO2 100%   Physical Exam  Constitutional: He appears well-developed and well-nourished.  HENT:  Head: Normocephalic and atraumatic.  Eyes: Conjunctivae are normal. Right eye exhibits no discharge. Left eye exhibits no discharge.  Neck: Normal range of motion. Neck supple.  Cardiovascular: Normal rate, regular rhythm and normal heart sounds.   Pulmonary/Chest: Effort normal and breath sounds normal.  Abdominal: Soft. Bowel sounds are normal. He exhibits no distension. There is tenderness. There is no rebound and no guarding.    Musculoskeletal:       Right shoulder: Normal.       Left shoulder: Normal.       Cervical back: He exhibits normal range of motion, no tenderness and no bony tenderness.       Thoracic  back: He exhibits normal range of motion, no tenderness and no bony tenderness.       Lumbar back: He exhibits tenderness and bony tenderness. He exhibits normal range of motion.  Neurological: He is alert.  Skin: Skin is warm and dry.  Psychiatric: He has a normal mood and affect.  Nursing note and vitals reviewed.   ED Course  Procedures (including critical care time) Labs Review Labs Reviewed  COMPREHENSIVE METABOLIC PANEL - Abnormal; Notable for the following:    Potassium 5.4 (*)    Chloride 100 (*)    BUN 48 (*)    Creatinine, Ser 12.13 (*)    Total Protein 8.2 (*)    Albumin 2.8 (*)    AST 50 (*)    ALT 14 (*)    Alkaline Phosphatase 138 (*)    GFR calc non Af Amer 4 (*)    GFR calc Af Amer 5 (*)    All other components within normal limits  CBC - Abnormal; Notable for the following:    RBC 3.09 (*)    Hemoglobin 8.4 (*)    HCT 26.9 (*)    RDW 15.9 (*)    All other components within normal limits  URINALYSIS, ROUTINE W REFLEX MICROSCOPIC (NOT AT [facility])  PROTIME-INR     EKG Interpretation   Date/Time:  Sunday November 12 2015 21:18:41 EDT Ventricular Rate:  86 PR Interval:  177 QRS Duration: 94 QT Interval:  402 QTC Calculation: 481 R Axis:   -12 Text Interpretation:  Sinus rhythm biphasic t waves Abnormal ekg Confirmed  by Carmin Muskrat  MD ([id number]) on 11/12/2015 10:01:48 PM      8:43 PM Patient not yet in room. Records from recent hospitalization reviewed.    Vital signs reviewed and are as follows: BP 169/120 mmHg  Pulse 80  Temp(Src) 98.4 F (36.9 C) (Oral)  Resp 18  Ht _1  (1.88 m)  Wt 72.122 kg  BMI 20.41 kg/m2  SpO2 100%  Patient seen and examined, discussed with Dr. Vanita Panda who will see.   10:30 PM Patient had slight improvement in pain. Labs reassuring. EKG reviewed with Dr. Vanita Panda.  12:54 AM patient notified of results. States that abdominal pain is now resolved. Back pain is much improved. He is ready for discharge home. We'll give  #5 Percocet for home as well as Zofran. No further vomiting in emergency department. Encouraged PCP follow-up/dialysis follow-up on Tuesday.  The patient was urged to return to the Emergency Department immediately with worsening of current symptoms, worsening abdominal pain, persistent vomiting, blood noted in stools, fever, or any other concerns. The patient verbalized understanding.    MDM   Final diagnoses:  Bilateral low back pain without sciatica  Abdominal pain, unspecified abdominal location   ESRD: Slight hyperkalemia, no EKG changes from this. Patient due for dialysis tomorrow. No signs of fluid overload.  Abdominal pain/vomiting: Right-sided. CT on 4/10 without aneurysm or any other concerning features. Normal white blood cell count today. Anemia baseline. Mild transaminitis is chronic. No focal right upper quadrant tenderness to suggest cholecystitis. No fever. Pain does not radiate to the shoulders.  Back pain: Likely chronic. Patient denies warning symptoms of back pain including: fecal incontinence, urinary retention or overflow incontinence, night sweats, waking from sleep with back pain, unexplained fevers or weight loss, h/o cancer, IVDU, recent trauma.    Known pericardial effusion: No SOB, abnormal heart sounds. No further eval deemed necessary tonight.      Carlisle Cater, PA-C 11/13/15 [id number]  Carmin Muskrat, MD 11/14/15 [phone]

## 2015-11-12 NOTE — ED Notes (Addendum)
Pt provided with ginger ale and ice chips, per request.    Pt does not create urine.  Will speak to MD about cancelling UA

## 2015-11-13 MED ORDER — ONDANSETRON 4 MG PO TBDP
4.0000 mg | ORAL_TABLET | Freq: Three times a day (TID) | ORAL | Status: DC | PRN
Start: 2015-11-13 — End: 2015-12-17

## 2015-11-13 MED ORDER — OXYCODONE-ACETAMINOPHEN 5-325 MG PO TABS: 1.0000 | ORAL_TABLET | Freq: Four times a day (QID) | ORAL | Status: DC | PRN

## 2015-11-13 NOTE — ED Notes (Signed)
Patient able to ambulate independently

## 2015-11-13 NOTE — Discharge Instructions (Signed)
Please read and follow all provided instructions.  Your diagnoses today include:  1. Bilateral low back pain without sciatica   2. Abdominal pain, unspecified abdominal location     Tests performed today include:  Vital signs - see below for your results today  Blood counts and electrolytes  Coumadin test - Coumadin level is low, please notify your Coumadin clinic  Medications prescribed:   Percocet (oxycodone/acetaminophen) - narcotic pain medication  DO NOT drive or perform any activities that require you to be awake and alert because this medicine can make you drowsy. BE VERY CAREFUL not to take multiple medicines containing Tylenol (also called acetaminophen). Doing so can lead to an overdose which can damage your liver and cause liver failure and possibly death.   Zofran (ondansetron) - for nausea and vomiting  Take any prescribed medications only as directed.  Home care instructions:   Follow any educational materials contained in this packet  Please rest, use ice or heat on your back for the next several days  Do not lift, push, pull anything more than 10 pounds for the next week  Follow-up instructions: Please follow-up with your primary care provider in the next 1 week for further evaluation of your symptoms.   Return instructions:  SEEK IMMEDIATE MEDICAL ATTENTION IF YOU HAVE:  New numbness, tingling, weakness, or problem with the use of your arms or legs  Severe back pain not relieved with medications  Loss control of your bowels or bladder  Increasing pain in any areas of the body (such as chest or abdominal pain)  Shortness of breath, dizziness, or fainting.   Worsening nausea (feeling sick to your stomach), vomiting, fever, or sweats  Any other emergent concerns regarding your health   Additional Information:  Your vital signs today were: BP 139/91 mmHg   Pulse 89   Temp(Src) 98.4 F (36.9 C) (Oral)   Resp 16   Ht 6' 2" (1.88 m)   Wt 72.122 kg    BMI 20.41 kg/m2   SpO2 94% If your blood pressure (BP) was elevated above 135/85 this visit, please have this repeated by your doctor within one month. --------------

## 2015-11-14 DIAGNOSIS — Z418 Encounter for other procedures for purposes other than remedying health state: Secondary | ICD-10-CM | POA: Diagnosis not present

## 2015-11-14 DIAGNOSIS — Z7901 Long term (current) use of anticoagulants: Secondary | ICD-10-CM | POA: Diagnosis not present

## 2015-11-14 DIAGNOSIS — N186 End stage renal disease: Secondary | ICD-10-CM | POA: Diagnosis not present

## 2015-11-14 DIAGNOSIS — D631 Anemia in chronic kidney disease: Secondary | ICD-10-CM | POA: Diagnosis not present

## 2015-11-14 DIAGNOSIS — N2581 Secondary hyperparathyroidism of renal origin: Secondary | ICD-10-CM | POA: Diagnosis not present

## 2015-11-15 ENCOUNTER — Encounter: Payer: Self-pay | Admitting: Cardiology

## 2015-11-15 ENCOUNTER — Ambulatory Visit: Payer: Self-pay | Admitting: Cardiology

## 2015-11-15 DIAGNOSIS — G4733 Obstructive sleep apnea (adult) (pediatric): Secondary | ICD-10-CM | POA: Diagnosis not present

## 2015-11-15 DIAGNOSIS — N5082 Scrotal pain: Secondary | ICD-10-CM | POA: Diagnosis not present

## 2015-11-15 DIAGNOSIS — I517 Cardiomegaly: Secondary | ICD-10-CM | POA: Diagnosis not present

## 2015-11-15 DIAGNOSIS — N186 End stage renal disease: Secondary | ICD-10-CM | POA: Diagnosis not present

## 2015-11-15 DIAGNOSIS — K219 Gastro-esophageal reflux disease without esophagitis: Secondary | ICD-10-CM | POA: Diagnosis not present

## 2015-11-15 DIAGNOSIS — N2889 Other specified disorders of kidney and ureter: Secondary | ICD-10-CM | POA: Diagnosis not present

## 2015-11-15 DIAGNOSIS — I509 Heart failure, unspecified: Secondary | ICD-10-CM | POA: Diagnosis not present

## 2015-11-15 DIAGNOSIS — I447 Left bundle-branch block, unspecified: Secondary | ICD-10-CM | POA: Diagnosis not present

## 2015-11-15 DIAGNOSIS — R112 Nausea with vomiting, unspecified: Secondary | ICD-10-CM | POA: Diagnosis not present

## 2015-11-15 DIAGNOSIS — N433 Hydrocele, unspecified: Secondary | ICD-10-CM | POA: Diagnosis not present

## 2015-11-15 DIAGNOSIS — Z992 Dependence on renal dialysis: Secondary | ICD-10-CM | POA: Diagnosis not present

## 2015-11-15 DIAGNOSIS — I444 Left anterior fascicular block: Secondary | ICD-10-CM | POA: Diagnosis not present

## 2015-11-15 DIAGNOSIS — Z87891 Personal history of nicotine dependence: Secondary | ICD-10-CM | POA: Diagnosis not present

## 2015-11-15 DIAGNOSIS — I120 Hypertensive chronic kidney disease with stage 5 chronic kidney disease or end stage renal disease: Secondary | ICD-10-CM | POA: Diagnosis not present

## 2015-11-16 ENCOUNTER — Encounter (HOSPITAL_COMMUNITY): Payer: Self-pay | Admitting: *Deleted

## 2015-11-16 DIAGNOSIS — N186 End stage renal disease: Secondary | ICD-10-CM | POA: Diagnosis not present

## 2015-11-16 DIAGNOSIS — Z992 Dependence on renal dialysis: Secondary | ICD-10-CM | POA: Diagnosis not present

## 2015-11-16 DIAGNOSIS — K5909 Other constipation: Secondary | ICD-10-CM | POA: Insufficient documentation

## 2015-11-16 DIAGNOSIS — I120 Hypertensive chronic kidney disease with stage 5 chronic kidney disease or end stage renal disease: Secondary | ICD-10-CM | POA: Diagnosis not present

## 2015-11-16 DIAGNOSIS — Z7901 Long term (current) use of anticoagulants: Secondary | ICD-10-CM | POA: Insufficient documentation

## 2015-11-16 DIAGNOSIS — Z79899 Other long term (current) drug therapy: Secondary | ICD-10-CM | POA: Insufficient documentation

## 2015-11-16 DIAGNOSIS — F329 Major depressive disorder, single episode, unspecified: Secondary | ICD-10-CM | POA: Diagnosis not present

## 2015-11-16 DIAGNOSIS — Z87891 Personal history of nicotine dependence: Secondary | ICD-10-CM | POA: Diagnosis not present

## 2015-11-16 DIAGNOSIS — Z7952 Long term (current) use of systemic steroids: Secondary | ICD-10-CM | POA: Insufficient documentation

## 2015-11-16 DIAGNOSIS — R112 Nausea with vomiting, unspecified: Secondary | ICD-10-CM | POA: Diagnosis not present

## 2015-11-16 DIAGNOSIS — R109 Unspecified abdominal pain: Secondary | ICD-10-CM | POA: Diagnosis present

## 2015-11-16 DIAGNOSIS — Z9889 Other specified postprocedural states: Secondary | ICD-10-CM | POA: Diagnosis not present

## 2015-11-16 DIAGNOSIS — I509 Heart failure, unspecified: Secondary | ICD-10-CM | POA: Insufficient documentation

## 2015-11-16 DIAGNOSIS — E1122 Type 2 diabetes mellitus with diabetic chronic kidney disease: Secondary | ICD-10-CM | POA: Diagnosis not present

## 2015-11-16 DIAGNOSIS — Z862 Personal history of diseases of the blood and blood-forming organs and certain disorders involving the immune mechanism: Secondary | ICD-10-CM | POA: Diagnosis not present

## 2015-11-16 DIAGNOSIS — D631 Anemia in chronic kidney disease: Secondary | ICD-10-CM | POA: Diagnosis not present

## 2015-11-16 DIAGNOSIS — N2581 Secondary hyperparathyroidism of renal origin: Secondary | ICD-10-CM | POA: Diagnosis not present

## 2015-11-16 DIAGNOSIS — Z418 Encounter for other procedures for purposes other than remedying health state: Secondary | ICD-10-CM | POA: Diagnosis not present

## 2015-11-16 DIAGNOSIS — F419 Anxiety disorder, unspecified: Secondary | ICD-10-CM | POA: Insufficient documentation

## 2015-11-16 LAB — COMPREHENSIVE METABOLIC PANEL WITH GFR
ALT: 11 U/L — ABNORMAL LOW (ref 17–63)
AST: 43 U/L — ABNORMAL HIGH (ref 15–41)
Albumin: 2.7 g/dL — ABNORMAL LOW (ref 3.5–5.0)
Alkaline Phosphatase: 126 U/L (ref 38–126)
Anion gap: 10 (ref 5–15)
BUN: 23 mg/dL — ABNORMAL HIGH (ref 6–20)
CO2: 26 mmol/L (ref 22–32)
Calcium: 9.1 mg/dL (ref 8.9–10.3)
Chloride: 101 mmol/L (ref 101–111)
Creatinine, Ser: 7.96 mg/dL — ABNORMAL HIGH (ref 0.61–1.24)
GFR calc Af Amer: 8 mL/min — ABNORMAL LOW
GFR calc non Af Amer: 7 mL/min — ABNORMAL LOW
Glucose, Bld: 96 mg/dL (ref 65–99)
Potassium: 5.2 mmol/L — ABNORMAL HIGH (ref 3.5–5.1)
Sodium: 137 mmol/L (ref 135–145)
Total Bilirubin: 0.4 mg/dL (ref 0.3–1.2)
Total Protein: 7.9 g/dL (ref 6.5–8.1)

## 2015-11-16 LAB — CBC
HCT: 22.8 % — ABNORMAL LOW (ref 39.0–52.0)
Hemoglobin: 7.4 g/dL — ABNORMAL LOW (ref 13.0–17.0)
MCH: 27.9 pg (ref 26.0–34.0)
MCHC: 32.5 g/dL (ref 30.0–36.0)
MCV: 86 fL (ref 78.0–100.0)
Platelets: 229 K/uL (ref 150–400)
RBC: 2.65 MIL/uL — ABNORMAL LOW (ref 4.22–5.81)
RDW: 15.9 % — ABNORMAL HIGH (ref 11.5–15.5)
WBC: 6.6 K/uL (ref 4.0–10.5)

## 2015-11-16 LAB — LIPASE, BLOOD: Lipase: 72 U/L — ABNORMAL HIGH (ref 11–51)

## 2015-11-16 NOTE — ED Notes (Signed)
Pt has been having lower back pain, abdominal pain and vomiting x 2 days.  Pt also reports pain and burning with urination.  No fever or chills

## 2015-11-17 ENCOUNTER — Emergency Department (HOSPITAL_COMMUNITY): Payer: Medicare Other

## 2015-11-17 ENCOUNTER — Encounter (HOSPITAL_COMMUNITY): Payer: Self-pay | Admitting: Emergency Medicine

## 2015-11-17 ENCOUNTER — Encounter (HOSPITAL_COMMUNITY): Payer: Self-pay | Admitting: *Deleted

## 2015-11-17 ENCOUNTER — Emergency Department (HOSPITAL_COMMUNITY)
Admission: EM | Admit: 2015-11-17 | Discharge: 2015-11-17 | Disposition: A | Discharge status: Home / Self Care | Payer: Medicare Other | Source: Home / Self Care | Attending: Emergency Medicine | Admitting: Emergency Medicine

## 2015-11-17 ENCOUNTER — Emergency Department (HOSPITAL_COMMUNITY)
Admission: EM | Admit: 2015-11-17 | Discharge: 2015-11-17 | Disposition: A | Discharge status: Home / Self Care | Payer: Medicare Other | Attending: Emergency Medicine | Admitting: Emergency Medicine

## 2015-11-17 DIAGNOSIS — Z9889 Other specified postprocedural states: Secondary | ICD-10-CM | POA: Insufficient documentation

## 2015-11-17 DIAGNOSIS — I509 Heart failure, unspecified: Secondary | ICD-10-CM

## 2015-11-17 DIAGNOSIS — K5909 Other constipation: Secondary | ICD-10-CM

## 2015-11-17 DIAGNOSIS — D649 Anemia, unspecified: Secondary | ICD-10-CM | POA: Diagnosis not present

## 2015-11-17 DIAGNOSIS — Z7952 Long term (current) use of systemic steroids: Secondary | ICD-10-CM

## 2015-11-17 DIAGNOSIS — Z992 Dependence on renal dialysis: Secondary | ICD-10-CM

## 2015-11-17 DIAGNOSIS — Z862 Personal history of diseases of the blood and blood-forming organs and certain disorders involving the immune mechanism: Secondary | ICD-10-CM | POA: Insufficient documentation

## 2015-11-17 DIAGNOSIS — R1909 Other intra-abdominal and pelvic swelling, mass and lump: Secondary | ICD-10-CM | POA: Diagnosis not present

## 2015-11-17 DIAGNOSIS — N451 Epididymitis: Secondary | ICD-10-CM | POA: Diagnosis not present

## 2015-11-17 DIAGNOSIS — R102 Pelvic and perineal pain: Secondary | ICD-10-CM | POA: Diagnosis not present

## 2015-11-17 DIAGNOSIS — R1031 Right lower quadrant pain: Secondary | ICD-10-CM | POA: Diagnosis not present

## 2015-11-17 DIAGNOSIS — N186 End stage renal disease: Secondary | ICD-10-CM

## 2015-11-17 DIAGNOSIS — D6489 Other specified anemias: Secondary | ICD-10-CM | POA: Diagnosis not present

## 2015-11-17 DIAGNOSIS — F419 Anxiety disorder, unspecified: Secondary | ICD-10-CM

## 2015-11-17 DIAGNOSIS — Z87891 Personal history of nicotine dependence: Secondary | ICD-10-CM

## 2015-11-17 DIAGNOSIS — R1084 Generalized abdominal pain: Secondary | ICD-10-CM | POA: Diagnosis not present

## 2015-11-17 DIAGNOSIS — Z79899 Other long term (current) drug therapy: Secondary | ICD-10-CM | POA: Insufficient documentation

## 2015-11-17 DIAGNOSIS — N2889 Other specified disorders of kidney and ureter: Secondary | ICD-10-CM | POA: Insufficient documentation

## 2015-11-17 DIAGNOSIS — G8929 Other chronic pain: Secondary | ICD-10-CM | POA: Insufficient documentation

## 2015-11-17 DIAGNOSIS — E1122 Type 2 diabetes mellitus with diabetic chronic kidney disease: Secondary | ICD-10-CM

## 2015-11-17 DIAGNOSIS — I723 Aneurysm of iliac artery: Secondary | ICD-10-CM | POA: Diagnosis not present

## 2015-11-17 DIAGNOSIS — F329 Major depressive disorder, single episode, unspecified: Secondary | ICD-10-CM

## 2015-11-17 DIAGNOSIS — Z7901 Long term (current) use of anticoagulants: Secondary | ICD-10-CM

## 2015-11-17 DIAGNOSIS — I10 Essential (primary) hypertension: Secondary | ICD-10-CM | POA: Diagnosis not present

## 2015-11-17 DIAGNOSIS — K828 Other specified diseases of gallbladder: Secondary | ICD-10-CM | POA: Diagnosis not present

## 2015-11-17 DIAGNOSIS — I120 Hypertensive chronic kidney disease with stage 5 chronic kidney disease or end stage renal disease: Secondary | ICD-10-CM | POA: Insufficient documentation

## 2015-11-17 DIAGNOSIS — R112 Nausea with vomiting, unspecified: Secondary | ICD-10-CM

## 2015-11-17 DIAGNOSIS — R109 Unspecified abdominal pain: Secondary | ICD-10-CM | POA: Diagnosis not present

## 2015-11-17 DIAGNOSIS — N50811 Right testicular pain: Secondary | ICD-10-CM | POA: Diagnosis not present

## 2015-11-17 DIAGNOSIS — M545 Low back pain: Secondary | ICD-10-CM | POA: Diagnosis not present

## 2015-11-17 LAB — I-STAT TROPONIN, ED: TROPONIN I, POC: 0.06 ng/mL (ref 0.00–0.08)

## 2015-11-17 MED ORDER — DICYCLOMINE HCL 10 MG/ML IM SOLN
20.0000 mg | Freq: Once | INTRAMUSCULAR | Status: AC
  Administered 2015-11-17: 20 mg via INTRAMUSCULAR
  Filled 2015-11-17: qty 2

## 2015-11-17 MED ORDER — HYOSCYAMINE SULFATE 0.125 MG SL SUBL: 0.1250 mg | SUBLINGUAL_TABLET | SUBLINGUAL | Status: DC | PRN

## 2015-11-17 MED ORDER — ONDANSETRON HCL 4 MG/2ML IJ SOLN
4.0000 mg | Freq: Once | INTRAMUSCULAR | Status: AC
  Administered 2015-11-17: 4 mg via INTRAVENOUS
  Filled 2015-11-17: qty 2

## 2015-11-17 MED ORDER — KETOROLAC TROMETHAMINE 30 MG/ML IJ SOLN
30.0000 mg | Freq: Once | INTRAMUSCULAR | Status: AC
  Administered 2015-11-17: 30 mg via INTRAVENOUS
  Filled 2015-11-17: qty 1

## 2015-11-17 MED ORDER — HYDROMORPHONE HCL 1 MG/ML IJ SOLN
1.0000 mg | Freq: Once | INTRAMUSCULAR | Status: AC
Start: 2015-11-17 — End: 2015-11-17
  Administered 2015-11-17: 1 mg via INTRAMUSCULAR
  Filled 2015-11-17: qty 1

## 2015-11-17 MED ORDER — ONDANSETRON 8 MG PO TBDP: ORAL_TABLET | ORAL | Status: DC

## 2015-11-17 NOTE — ED Notes (Signed)
Pt taken to [facility]

## 2015-11-17 NOTE — Discharge Instructions (Signed)
High-Fiber Diet Fiber, also called dietary fiber, is a type of carbohydrate found in fruits, vegetables, whole grains, and beans. A high-fiber diet can have many health benefits. Your health care provider may recommend a high-fiber diet to help:  Prevent constipation. Fiber can make your bowel movements more regular.  Lower your cholesterol.  Relieve hemorrhoids, uncomplicated diverticulosis, or irritable bowel syndrome.  Prevent overeating as part of a weight-loss plan.  Prevent heart disease, type 2 diabetes, and certain cancers. WHAT IS MY PLAN? The recommended daily intake of fiber includes:  38 grams for men under age 69.  28 grams for men over age 42.  16 grams for women under age 71.  86 grams for women over age 75. You can get the recommended daily intake of dietary fiber by eating a variety of fruits, vegetables, grains, and beans. Your health care provider may also recommend a fiber supplement if it is not possible to get enough fiber through your diet. WHAT DO I NEED TO KNOW ABOUT A HIGH-FIBER DIET?  Fiber supplements have not been widely studied for their effectiveness, so it is better to get fiber through food sources.  Always check the fiber content on thenutrition facts label of any prepackaged food. Look for foods that contain at least 5 grams of fiber per serving.  Ask your dietitian if you have questions about specific foods that are related to your condition, especially if those foods are not listed in the following section.  Increase your daily fiber consumption gradually. Increasing your intake of dietary fiber too quickly may cause bloating, cramping, or gas.  Drink plenty of water. Water helps you to digest fiber. WHAT FOODS CAN I EAT? Grains Whole-grain breads. Multigrain cereal. Oats and oatmeal. Brown rice. Barley. Bulgur wheat. [place]. Bran muffins. Popcorn. Rye wafer crackers. Vegetables Sweet potatoes. Spinach. Kale. Artichokes. Cabbage. Broccoli.  Green peas. Carrots. Squash. Fruits Berries. Pears. Apples. Oranges. Avocados. Prunes and raisins. Dried figs. Meats and Other Protein Sources [place], kidney, pinto, and soy beans. Split peas. Lentils. Nuts and seeds. Dairy Fiber-fortified yogurt. Beverages Fiber-fortified soy milk. Fiber-fortified orange juice. Other Fiber bars. The items listed above may not be a complete list of recommended foods or beverages. Contact your dietitian for more options. WHAT FOODS ARE NOT RECOMMENDED? Grains White bread. Pasta made with refined flour. White rice. Vegetables Fried potatoes. Canned vegetables. Well-cooked vegetables.  Fruits Fruit juice. Cooked, strained fruit. Meats and Other Protein Sources Fatty cuts of meat. Fried Sales executive or fried fish. Dairy Milk. Yogurt. Cream cheese. Sour cream. Beverages Soft drinks. Other Cakes and pastries. Butter and oils. The items listed above may not be a complete list of foods and beverages to avoid. Contact your dietitian for more information. WHAT ARE SOME TIPS FOR INCLUDING HIGH-FIBER FOODS IN MY DIET?  Eat a wide variety of high-fiber foods.  Make sure that half of all grains consumed each day are whole grains.  Replace breads and cereals made from refined flour or white flour with whole-grain breads and cereals.  Replace white rice with brown rice, bulgur wheat, or millet.  Start the day with a breakfast that is high in fiber, such as a cereal that contains at least 5 grams of fiber per serving.  Use beans in place of meat in soups, salads, or pasta.  Eat high-fiber snacks, such as berries, raw vegetables, nuts, or popcorn.   This information is not intended to replace advice given to you by your health care provider. Make sure you discuss  any questions you have with your health care provider.   Document Released: 07/08/2005 Document Revised: 07/29/2014 Document Reviewed: 12/21/2013 Elsevier Interactive Patient Education 2016 Elsevier  Inc.  Nausea and Vomiting Nausea is a sick feeling that often comes before throwing up (vomiting). Vomiting is a reflex where stomach contents come out of your mouth. Vomiting can cause severe loss of body fluids (dehydration). Children and elderly adults can become dehydrated quickly, especially if they also have diarrhea. Nausea and vomiting are symptoms of a condition or disease. It is important to find the cause of your symptoms. CAUSES   Direct irritation of the stomach lining. This irritation can result from increased acid production (gastroesophageal reflux disease), infection, food poisoning, taking certain medicines (such as nonsteroidal anti-inflammatory drugs), alcohol use, or tobacco use.  Signals from the brain.These signals could be caused by a headache, heat exposure, an inner ear disturbance, increased pressure in the brain from injury, infection, a tumor, or a concussion, pain, emotional stimulus, or metabolic problems.  An obstruction in the gastrointestinal tract (bowel obstruction).  Illnesses such as diabetes, hepatitis, gallbladder problems, appendicitis, kidney problems, cancer, sepsis, atypical symptoms of a heart attack, or eating disorders.  Medical treatments such as chemotherapy and radiation.  Receiving medicine that makes you sleep (general anesthetic) during surgery. DIAGNOSIS Your caregiver may ask for tests to be done if the problems do not improve after a few days. Tests may also be done if symptoms are severe or if the reason for the nausea and vomiting is not clear. Tests may include:  Urine tests.  Blood tests.  Stool tests.  Cultures (to look for evidence of infection).  X-rays or other imaging studies. Test results can help your caregiver make decisions about treatment or the need for additional tests. TREATMENT You need to stay well hydrated. Drink frequently but in small amounts.You may wish to drink water, sports drinks, clear broth, or eat  frozen ice pops or gelatin dessert to help stay hydrated.When you eat, eating slowly may help prevent nausea.There are also some antinausea medicines that may help prevent nausea. HOME CARE INSTRUCTIONS   Take all medicine as directed by your caregiver.  If you do not have an appetite, do not force yourself to eat. However, you must continue to drink fluids.  If you have an appetite, eat a normal diet unless your caregiver tells you differently.  Eat a variety of complex carbohydrates (rice, wheat, potatoes, bread), lean meats, yogurt, fruits, and vegetables.  Avoid high-fat foods because they are more difficult to digest.  Drink enough water and fluids to keep your urine clear or pale yellow.  If you are dehydrated, ask your caregiver for specific rehydration instructions. Signs of dehydration may include:  Severe thirst.  Dry lips and mouth.  Dizziness.  Dark urine.  Decreasing urine frequency and amount.  Confusion.  Rapid breathing or pulse. SEEK IMMEDIATE MEDICAL CARE IF:   You have blood or brown flecks (like coffee grounds) in your vomit.  You have black or bloody stools.  You have a severe headache or stiff neck.  You are confused.  You have severe abdominal pain.  You have chest pain or trouble breathing.  You do not urinate at least once every 8 hours.  You develop cold or clammy skin.  You continue to vomit for longer than 24 to 48 hours.  You have a fever. MAKE SURE YOU:   Understand these instructions.  Will watch your condition.  Will get help right away  if you are not doing well or get worse.   This information is not intended to replace advice given to you by your health care provider. Make sure you discuss any questions you have with your health care provider.   Document Released: 07/08/2005 Document Revised: 09/30/2011 Document Reviewed: 12/05/2010 Elsevier Interactive Patient Education 2016 [employer].  Constipation,  Adult Constipation is when a person:  Poops (has a bowel movement) less than 3 times a week.  Has a hard time pooping.  Has poop that is dry, hard, or bigger than normal. HOME CARE   Eat foods with a lot of fiber in them. This includes fruits, vegetables, beans, and whole grains such as brown rice.  Avoid fatty foods and foods with a lot of sugar. This includes french fries, hamburgers, cookies, candy, and soda.  If you are not getting enough fiber from food, take products with added fiber in them (supplements).  Drink enough fluid to keep your pee (urine) clear or pale yellow.  Exercise on a regular basis, or as told by your doctor.  Go to the restroom when you feel like you need to poop. Do not hold it.  Only take medicine as told by your doctor. Do not take medicines that help you poop (laxatives) without talking to your doctor first. GET HELP RIGHT AWAY IF:   You have bright red blood in your poop (stool).  Your constipation lasts more than 4 days or gets worse.  You have belly (abdominal) or butt (rectal) pain.  You have thin poop (as thin as a pencil).  You lose weight, and it cannot be explained. MAKE SURE YOU:   Understand these instructions.  Will watch your condition.  Will get help right away if you are not doing well or get worse.   This information is not intended to replace advice given to you by your health care provider. Make sure you discuss any questions you have with your health care provider.   Document Released: 12/25/2007 Document Revised: 07/29/2014 Document Reviewed: 04/19/2013 Elsevier Interactive Patient Education [employer].

## 2015-11-17 NOTE — ED Provider Notes (Addendum)
CSN: [id number]     Arrival date & time 11/16/15  2127 History   First MD Initiated Contact with Patient 11/17/15 [phone]     Chief Complaint  Patient presents with  . Emesis     (Consider location/radiation/quality/duration/timing/severity/associated sxs/prior Treatment) Patient is a 50 y.o. male presenting with vomiting. The history is provided by the patient.  Emesis Severity:  Mild Timing:  Intermittent Quality:  Stomach contents Progression:  Unchanged Chronicity:  New Context: not post-tussive   Relieved by:  Nothing Worsened by:  Nothing tried Ineffective treatments:  None tried Associated symptoms: abdominal pain   Associated symptoms: no diarrhea and no fever   Risk factors: no alcohol use and no suspect food intake   Pain has been going on for several days.  Had dialysis today and they told him to come to the ED for it because his hernia was probably causing the problem  Past Medical History  Diagnosis Date  . Hypertension   . Depression   . Complication of anesthesia     itching, sore throat  . Shortness of breath   . Anxiety   . CHF (congestive heart failure) ([place])   . Anemia   . Dialysis patient ([place])   . Type II diabetes mellitus ([facility])     No history per patient, but remains under history as A1c would not be accurate given on dialysis  . ESRD (end stage renal disease) ([place])     due to HTN per patient, followed at [facility], s/p failed kidney transplant - dialysis Tue, Th, Sat  . Renal insufficiency    Past Surgical History  Procedure Laterality Date  . Kidney receipient  2006    failed and started HD in March 2014  . Capd insertion    . Capd removal    . Left heart catheterization with coronary angiogram N/A 09/02/2014    Procedure: LEFT HEART CATHETERIZATION WITH CORONARY ANGIOGRAM;  Surgeon: [name], MD;  Location: [facility] CATH LAB;  Service: Cardiovascular;  Laterality: N/A;  . Inguinal hernia repair Right 02/14/2015    Procedure: REPAIR INCARCERATED  RIGHT INGUINAL HERNIA;  Surgeon: [name], MD;  Location: [place];  Service: General;  Laterality: Right;  . Insertion of dialysis catheter Right 09/23/2015    Procedure: exchange of Right internal Dialysis Catheter.;  Surgeon: [name], MD;  Location: [facility] OR;  Service: Vascular;  Laterality: Right;   Family History  Problem Relation Age of Onset  . Hypertension Other    Social History  Substance Use Topics  . Smoking status: Former Smoker -- 0.00 packs/day for 1 years    Types: Cigarettes  . Smokeless tobacco: Never Used     Comment: quit Jan 2014  . Alcohol Use: No    Review of Systems  Respiratory: Negative for chest tightness and shortness of breath.   Cardiovascular: Negative for chest pain, palpitations and leg swelling.  Gastrointestinal: Positive for vomiting and abdominal pain. Negative for diarrhea and constipation.  All other systems reviewed and are negative.     Allergies  Butalbital-apap-caffeine; Ferrlecit; Minoxidil; and Darvocet  Home Medications   Prior to Admission medications   Medication Sig Start Date End Date Taking? Authorizing Provider  ALPRAZolam [name]) 0.5 MG tablet Take 1 tablet (0.5 mg total) by mouth 2 (two) times daily as needed for anxiety. 11/04/15   [name], MD  amLODipine (NORVASC) 10 MG tablet Take 10 mg by mouth daily.    Historical Provider, MD  atorvastatin (  LIPITOR) 40 MG tablet Take 1 tablet (40 mg total) by mouth daily at 6 PM. 09/02/14   Barton Dubois, MD  carvedilol (COREG) 12.5 MG tablet Take 1 tablet (12.5 mg total) by mouth 2 (two) times daily with a meal. 11/04/15   [name], MD  cinacalcet (SENSIPAR) 30 MG tablet Take 30 mg by mouth daily.    Historical Provider, MD  cyclobenzaprine (FLEXERIL) 10 MG tablet Take 1 tablet (10 mg total) by mouth 3 (three) times daily as needed for muscle spasms. 11/04/15   [name], MD  docusate sodium (COLACE) 100 MG capsule Take 1 capsule (100 mg total) by mouth 2 (two) times  daily. 11/19/14   [name], MD  doxercalciferol (HECTOROL) 4 MCG/2ML injection Inject 1.25 mLs (2.5 mcg total) into the vein Every Tuesday,Thursday,and Saturday with dialysis. 10/10/14   [name], DO  gabapentin (NEURONTIN) 100 MG capsule Take 100 mg by mouth 3 (three) times daily. 06/14/15 10/30/15  Historical Provider, MD  gabapentin (NEURONTIN) 100 MG capsule Take 1 capsule (100 mg total) by mouth 3 (three) times daily. 11/04/15 03/21/16  [name], MD  hydrALAZINE (APRESOLINE) 50 MG tablet Take 2 tablets (100 mg total) by mouth every 8 (eight) hours. 09/11/15   [name], MD  hydrocerin (EUCERIN) CREA Apply 1 application topically 2 (two) times daily. 02/04/15   [name], MD  hydrOXYzine (ATARAX/VISTARIL) 10 MG tablet Take 1 tablet (10 mg total) by mouth 3 (three) times daily as needed for itching. 09/27/15   [name], MD  ibuprofen (ADVIL,MOTRIN) 600 MG tablet Take 1 tablet (600 mg total) by mouth every 8 (eight) hours as needed. 10/06/15   [name], MD  isosorbide mononitrate (IMDUR) 30 MG 24 hr tablet Take 3 tablets (90 mg total) by mouth daily. 09/11/15   [name], MD  levalbuterol [facility] HFA) 45 MCG/ACT inhaler Inhale 2 puffs into the lungs every 6 (six) hours as needed for wheezing or shortness of breath. 11/19/14   [name], MD  lisinopril (PRINIVIL,ZESTRIL) 20 MG tablet Take 1 tablet (20 mg total) by mouth daily. 02/04/15   [name], MD  multivitamin (RENA-VIT) TABS tablet Take 1 tablet by mouth at bedtime. 02/04/15   [name], MD  omeprazole (PRILOSEC) 20 MG capsule Take 20 mg by mouth daily. 07/01/13   Historical Provider, MD  ondansetron (ZOFRAN ODT) 4 MG disintegrating tablet Take 1 tablet (4 mg total) by mouth every 8 (eight) hours as needed for nausea or vomiting. 11/13/15   Carlisle Cater, PA-C  ondansetron (ZOFRAN) 4 MG tablet Take 1 tablet (4 mg total) by mouth every 6 (six) hours. 10/29/15   Orpah Greek, MD   oxyCODONE-acetaminophen (PERCOCET/ROXICET) 5-325 MG tablet Take 1-2 tablets by mouth every 6 (six) hours as needed for severe pain. 11/13/15   Carlisle Cater, PA-C  predniSONE (DELTASONE) 5 MG tablet Take 5 mg by mouth daily with breakfast.    Historical Provider, MD  sevelamer carbonate (RENVELA) 800 MG tablet Take 800-1,600 mg by mouth 3 (three) times daily with meals. 2 tabs three times daily with meals, and 1 tablet with snacks    Historical Provider, MD  traMADol (ULTRAM) 50 MG tablet Take 1 tablet (50 mg total) by mouth every 6 (six) hours as needed for moderate pain. 11/04/15   [name], MD  warfarin (COUMADIN) 6 MG tablet Take 1.5 tablets (9 mg total) by mouth daily at 6  PM. Take daily until adjusted per MD 09/12/15   [name], MD   BP 184/128 mmHg  Pulse 98  Temp(Src) 99.5 F (37.5 C) (Oral)  Resp 20  Wt 160 lb (72.576 kg)  SpO2 100% Physical Exam  Constitutional: He is oriented to person, place, and time. He appears well-developed and well-nourished. No distress.  HENT:  Head: Normocephalic and atraumatic.  Mouth/Throat: Oropharynx is clear and moist.  Eyes: Conjunctivae are normal. Pupils are equal, round, and reactive to light.  Neck: Normal range of motion. Neck supple.  Cardiovascular: Normal rate, regular rhythm and intact distal pulses.   Pulmonary/Chest: Effort normal and breath sounds normal. No respiratory distress. He has no wheezes. He has no rales.  Abdominal: Soft. He exhibits no distension. There is no tenderness. There is no rigidity, no rebound, no guarding, no tenderness at McBurney's point and negative Murphy's sign. No hernia. Hernia confirmed negative in the ventral area and confirmed negative in the left inguinal area.  gassy  Musculoskeletal: Normal range of motion.  Neurological: He is alert and oriented to person, place, and time. He has normal reflexes.  Skin: Skin is warm and dry.  Psychiatric: He has a normal mood and affect.    ED Course   Procedures (including critical care time) Labs Review Labs Reviewed  LIPASE, BLOOD - Abnormal; Notable for the following:    Lipase 72 (*)    All other components within normal limits  COMPREHENSIVE METABOLIC PANEL - Abnormal; Notable for the following:    Potassium 5.2 (*)    BUN 23 (*)    Creatinine, Ser 7.96 (*)    Albumin 2.7 (*)    AST 43 (*)    ALT 11 (*)    GFR calc non Af Amer 7 (*)    GFR calc Af Amer 8 (*)    All other components within normal limits  CBC - Abnormal; Notable for the following:    RBC 2.65 (*)    Hemoglobin 7.4 (*)    HCT 22.8 (*)    RDW 15.9 (*)    All other components within normal limits  URINALYSIS, ROUTINE W REFLEX MICROSCOPIC (NOT AT [facility])  URINALYSIS, ROUTINE W REFLEX MICROSCOPIC (NOT AT [facility])  I-STAT TROPOININ, ED    Imaging Review Dg Abd Acute W/chest  11/17/2015  CLINICAL DATA:  Acute onset of generalized back and abdominal pain. Vomiting. Initial encounter. EXAM: DG ABDOMEN ACUTE W/ 1V CHEST COMPARISON:  Chest and abdominal radiographs performed 10/29/2015, and CT of the abdomen and pelvis performed 10/30/2015 FINDINGS: The lungs are well-aerated. Vascular congestion is noted. Increased interstitial markings raise concern for mild interstitial edema. There is no evidence of pleural effusion or pneumothorax. The cardiomediastinal silhouette is this borderline enlarged. A right-sided dual-lumen catheter is noted ending about the distal SVC. The visualized bowel gas pattern is unremarkable. Scattered stool and air are seen within the colon; there is no evidence of small bowel dilatation to suggest obstruction. No free intra-abdominal air is identified on the provided upright view. No acute osseous abnormalities are seen; the sacroiliac joints are unremarkable in appearance. IMPRESSION: 1. Unremarkable bowel gas pattern; no free intra-abdominal air seen. Small to moderate amount of stool noted in the colon. 2. Vascular congestion and mild cardiomegaly,  without significant pulmonary edema. Electronically Signed   By: Garald Balding M.D.   On: 11/17/2015 04:02   I have personally reviewed and evaluated these images and lab results as part of my medical decision-making.  EKG Interpretation None      MDM   Final diagnoses:  None    ED ECG REPORT   Date: 11/17/2015  Rate: 85  Rhythm: normal sinus rhythm  QRS Axis: normal  Intervals: QT prolonged  ST/T Wave abnormalities: nonspecific ST changes  Conduction Disutrbances:none  Narrative Interpretation:   Old EKG Reviewed: none available Results for orders placed or performed during the hospital encounter of 11/17/15  Lipase, blood  Result Value Ref Range   Lipase 72 (H) 11 - 51 U/L  Comprehensive metabolic panel  Result Value Ref Range   Sodium 137 135 - 145 mmol/L   Potassium 5.2 (H) 3.5 - 5.1 mmol/L   Chloride 101 101 - 111 mmol/L   CO2 26 22 - 32 mmol/L   Glucose, Bld 96 65 - 99 mg/dL   BUN 23 (H) 6 - 20 mg/dL   Creatinine, Ser 7.96 (H) 0.61 - 1.24 mg/dL   Calcium 9.1 8.9 - 10.3 mg/dL   Total Protein 7.9 6.5 - 8.1 g/dL   Albumin 2.7 (L) 3.5 - 5.0 g/dL   AST 43 (H) 15 - 41 U/L   ALT 11 (L) 17 - 63 U/L   Alkaline Phosphatase 126 38 - 126 U/L   Total Bilirubin 0.4 0.3 - 1.2 mg/dL   GFR calc non Af Amer 7 (L) >60 mL/min   GFR calc Af Amer 8 (L) >60 mL/min   Anion gap 10 5 - 15  CBC  Result Value Ref Range   WBC 6.6 4.0 - 10.5 K/uL   RBC 2.65 (L) 4.22 - 5.81 MIL/uL   Hemoglobin 7.4 (L) 13.0 - 17.0 g/dL   HCT 22.8 (L) 39.0 - 52.0 %   MCV 86.0 78.0 - 100.0 fL   MCH 27.9 26.0 - 34.0 pg   MCHC 32.5 30.0 - 36.0 g/dL   RDW 15.9 (H) 11.5 - 15.5 %   Platelets 229 150 - 400 K/uL  I-stat troponin, ED  Result Value Ref Range   Troponin i, poc 0.06 0.00 - 0.08 ng/mL   Comment 3           Dg Chest 2 View  10/19/2015  CLINICAL DATA:  HTN, ESRD, dialysis patient, Type II diabetes, CHF EXAM: CHEST  2 VIEW COMPARISON:  10/16/2015 FINDINGS: Mild to moderate enlargement of the  cardiopericardial silhouette, stable. No mediastinal or hilar masses or evidence of adenopathy. Right internal jugular tunneled dual lumen central venous catheter is stable with its tip in the lower superior vena cava just above the caval atrial junction. Lungs are essentially clear. No evidence of pulmonary edema or pneumonia. No pleural effusion or pneumothorax. Skeletal structures are unremarkable. IMPRESSION: 1. No acute cardiopulmonary disease. Electronically Signed   By: Lajean Manes M.D.   On: 10/19/2015 21:37   Dg Abd Acute W/chest  11/17/2015  CLINICAL DATA:  Acute onset of generalized back and abdominal pain. Vomiting. Initial encounter. EXAM: DG ABDOMEN ACUTE W/ 1V CHEST COMPARISON:  Chest and abdominal radiographs performed 10/29/2015, and CT of the abdomen and pelvis performed 10/30/2015 FINDINGS: The lungs are well-aerated. Vascular congestion is noted. Increased interstitial markings raise concern for mild interstitial edema. There is no evidence of pleural effusion or pneumothorax. The cardiomediastinal silhouette is this borderline enlarged. A right-sided dual-lumen catheter is noted ending about the distal SVC. The visualized bowel gas pattern is unremarkable. Scattered stool and air are seen within the colon; there is no evidence of small bowel dilatation to suggest obstruction. No free  intra-abdominal air is identified on the provided upright view. No acute osseous abnormalities are seen; the sacroiliac joints are unremarkable in appearance. IMPRESSION: 1. Unremarkable bowel gas pattern; no free intra-abdominal air seen. Small to moderate amount of stool noted in the colon. 2. Vascular congestion and mild cardiomegaly, without significant pulmonary edema. Electronically Signed   By: Garald Balding M.D.   On: 11/17/2015 04:02   Dg Abd Acute W/chest  10/29/2015  CLINICAL DATA:  Nausea and vomiting for several days. EXAM: DG ABDOMEN ACUTE W/ 1V CHEST COMPARISON:  10/19/2015 FINDINGS: There is  a right jugular central line with tip in the low SVC. There is moderate unchanged cardiomegaly. Mild chronic appearing interstitial coarsening is present, unchanged. No consolidation. No large effusion. The abdominal gas pattern is negative for obstruction or perforation. There is a generous volume of stool throughout the colon. No biliary or urinary calculi are evident. IMPRESSION: Negative abdominal radiographs. No acute cardiopulmonary disease. Unchanged cardiomegaly. Electronically Signed   By: Andreas Newport M.D.   On: 10/29/2015 03:15   Ct Renal Stone Study  10/30/2015  CLINICAL DATA:  Back pain. Nausea vomiting 2 days. Dialysis patient. EXAM: CT ABDOMEN AND PELVIS WITHOUT CONTRAST TECHNIQUE: Multidetector CT imaging of the abdomen and pelvis was performed following the standard protocol without IV contrast. COMPARISON:  CT abdomen pelvis 03/11/2015 FINDINGS: Lower chest: Cardiac enlargement with moderate pericardial effusion. Lung bases clear Hepatobiliary: The liver is mildly enlarged. No focal liver lesion. Gallbladder and bile ducts normal. Pancreas: Negative Spleen: Negative Adrenals/Urinary Tract: Left midpole renal mass measures 39 x 42 mm and shows significant interval growth since the prior study. Limited evaluation without intravenous contrast however this appears to be a solid lesion and is likely renal cell carcinoma. Atrophy of both kidneys without hydronephrosis. No renal calculi. Urinary bladder is collapsed. Renal transplant in the left pelvis unchanged from the prior study. Limited evaluation without intravenous contrast. Stomach/Bowel: Negative for bowel obstruction. No bowel edema or mass. Appendix not well seen but no evidence of appendicitis. Negative for diverticulitis. Vascular/Lymphatic: Advanced atherosclerotic calcification in the aorta and iliac arteries. No aneurysm. No lymphadenopathy. Reproductive: Negative Other: Negative for ascites. Musculoskeletal: Negative IMPRESSION:  Left renal mass has enlarged since the prior CT and is consistent with carcinoma. Renal transplant left lower quadrant with limited evaluation without intravenous contrast. Moderate pericardial effusion Electronically Signed   By: Franchot Gallo M.D.   On: 10/30/2015 07:22   Ct Renal Abd W/wo  10/30/2015  CLINICAL DATA:  Left renal mass, abdominal pain for 2 days with nausea and vomiting. EXAM: CT ABDOMEN AND PELVIS WITHOUT AND WITH CONTRAST TECHNIQUE: Multidetector CT imaging of the abdomen and pelvis was performed following the standard protocol before and following the bolus administration of intravenous contrast. CONTRAST:  100 cc Isovue-300. COMPARISON:  10/30/2015 and 03/11/2015. FINDINGS: Lower chest: Lung bases show no acute findings. Heart is moderately enlarged. Small pericardial effusion. No pericardial effusion. Hepatobiliary: Liver is heterogeneous. Gallbladder is unremarkable. No biliary ductal dilatation. Pancreas: Negative. Spleen: Negative. Adrenals/Urinary Tract: Adrenal glands are unremarkable. Native kidneys are atrophic. Heterogeneous mass off the interpolar left kidney measures 3.5 cm (401/54), previously 1.9 cm on 03/11/2015. It measures 33 Hounsfield units on precontrast imaging and 53 Hounsfield units on nephrographic phase imaging. Enhancement characteristics are most likely stunted by renal failure. Additional low-attenuation lesions in the kidneys measure up to 1.4 cm on the right, too small to characterize. Left renal transplant is partially imaged in the left iliac fossa. Stomach/Bowel: Stomach  and visualized portions of the small bowel and colon are grossly unremarkable. Vascular/Lymphatic: Atherosclerotic calcification of the arterial vasculature without abdominal aortic aneurysm. Scattered lymph nodes are not enlarged by CT size criteria. Other: No free fluid. Mesenteries and peritoneum are grossly unremarkable. Musculoskeletal: No worrisome lytic or sclerotic lesions. IMPRESSION:  1. Enlarging left renal mass with mild enhancement, worrisome for renal cell carcinoma. Please see discussion above. 2. Small pericardial effusion. 3. Heterogeneity within the liver parenchyma may be due to passive congestion related to impaired cardiac function. Electronically Signed   By: Lorin Picket M.D.   On: 10/30/2015 11:54    Medications  dicyclomine (BENTYL) injection 20 mg (20 mg Intramuscular Given 11/17/15 0437)  ketorolac (TORADOL) 30 MG/ML injection 30 mg (30 mg Intravenous Given 11/17/15 0437)  ondansetron (ZOFRAN) injection 4 mg (4 mg Intravenous Given 11/17/15 0506)   PO challenged successfully no signs of infection.  States he does not make urine.  Well appearing stable for discharge.  Strict return precautions given.  Just filled opioid RX 2.5 days ago.  Will not be writing for another in light of recent fill as well as constipation     Kayti Poss, MD 11/17/15 1749  Veatrice Kells, MD 11/17/15 [id number]

## 2015-11-17 NOTE — ED Notes (Signed)
Pt tolerating liquids, asking for food.

## 2015-11-17 NOTE — ED Provider Notes (Signed)
CSN: [id number]     Arrival date & time 11/17/15  4709 History   First MD Initiated Contact with Patient 11/17/15 [phone]     Chief Complaint  Patient presents with  . Back Pain     (Consider location/radiation/quality/duration/timing/severity/associated sxs/prior Treatment) Patient is a 50 y.o. male presenting with back pain and flank pain.  Back Pain Associated symptoms: no abdominal pain (improved), no chest pain, no fever and no headaches   Flank Pain This is a chronic problem. The current episode started more than 1 week ago. The problem occurs constantly. The problem has been rapidly worsening. Pertinent negatives include no chest pain, no abdominal pain (improved), no headaches and no shortness of breath. Relieved by: narcotics. Treatments tried: toradol, bentyl,zofran in ED. The treatment provided mild relief.    Past Medical History  Diagnosis Date  . Hypertension   . Depression   . Complication of anesthesia     itching, sore throat  . Shortness of breath   . Anxiety   . CHF (congestive heart failure) ([place])   . Anemia   . Dialysis patient ([place])   . Type II diabetes mellitus (H[facility])     No history per patient, but remains under history as A1c would not be accurate given on dialysis  . ESRD (end stage renal disease) ([place])     due to HTN per patient, followed at [facility], s/p failed kidney transplant - dialysis Tue, Th, Sat  . Renal insufficiency    Past Surgical History  Procedure Laterality Date  . Kidney receipient  2006    failed and started HD in March 2014  . Capd insertion    . Capd removal    . Left heart catheterization with coronary angiogram N/A 09/02/2014    Procedure: LEFT HEART CATHETERIZATION WITH CORONARY ANGIOGRAM;  Surgeon: [name], MD;  Location: [facility] CATH LAB;  Service: Cardiovascular;  Laterality: N/A;  . Inguinal hernia repair Right 02/14/2015    Procedure: REPAIR INCARCERATED RIGHT INGUINAL HERNIA;  Surgeon: [name], MD;  Location: [facility];   Service: General;  Laterality: Right;  . Insertion of dialysis catheter Right 09/23/2015    Procedure: exchange of Right internal Dialysis Catheter.;  Surgeon: [name], MD;  Location: [facility] OR;  Service: Vascular;  Laterality: Right;   Family History  Problem Relation Age of Onset  . Hypertension Other    Social History  Substance Use Topics  . Smoking status: Former Smoker -- 0.00 packs/day for 1 years    Types: Cigarettes  . Smokeless tobacco: Never Used     Comment: quit Jan 2014  . Alcohol Use: No    Review of Systems  Constitutional: Negative for fever.  HENT: Negative for sore throat.   Eyes: Negative for visual disturbance.  Respiratory: Negative for shortness of breath.   Cardiovascular: Negative for chest pain.  Gastrointestinal: Negative for nausea (improved), vomiting (improved) and abdominal pain (improved).  Genitourinary: Positive for flank pain. Negative for difficulty urinating (does not make urine).  Musculoskeletal: Negative for back pain and neck stiffness.  Skin: Negative for rash.  Neurological: Negative for syncope and headaches.      Allergies  Butalbital-apap-caffeine; Ferrlecit; Minoxidil; and Darvocet  Home Medications   Prior to Admission medications   Medication Sig Start Date End Date Taking? Authorizing Provider  ALPRAZolam [name]) 0.5 MG tablet Take 1 tablet (0.5 mg total) by mouth 2 (two) times daily as needed for anxiety. 11/04/15   [name], MD  amLODipine (NORVASC) 10 MG tablet Take 10 mg by mouth daily.    Historical Provider, MD  atorvastatin (LIPITOR) 40 MG tablet Take 1 tablet (40 mg total) by mouth daily at 6 PM. 09/02/14   Barton Dubois, MD  carvedilol (COREG) 12.5 MG tablet Take 1 tablet (12.5 mg total) by mouth 2 (two) times daily with a meal. 11/04/15   [name], MD  cinacalcet (SENSIPAR) 30 MG tablet Take 30 mg by mouth daily.    Historical Provider, MD  cyclobenzaprine (FLEXERIL) 10 MG tablet Take 1 tablet (10 mg  total) by mouth 3 (three) times daily as needed for muscle spasms. 11/04/15   [name], MD  docusate sodium (COLACE) 100 MG capsule Take 1 capsule (100 mg total) by mouth 2 (two) times daily. 11/19/14   [name], MD  doxercalciferol (HECTOROL) 4 MCG/2ML injection Inject 1.25 mLs (2.5 mcg total) into the vein Every Tuesday,Thursday,and Saturday with dialysis. 10/10/14   [name], DO  gabapentin (NEURONTIN) 100 MG capsule Take 100 mg by mouth 3 (three) times daily. 06/14/15 10/30/15  Historical Provider, MD  gabapentin (NEURONTIN) 100 MG capsule Take 1 capsule (100 mg total) by mouth 3 (three) times daily. 11/04/15 03/21/16  [name], MD  hydrALAZINE (APRESOLINE) 50 MG tablet Take 2 tablets (100 mg total) by mouth every 8 (eight) hours. 09/11/15   [name], MD  hydrocerin (EUCERIN) CREA Apply 1 application topically 2 (two) times daily. 02/04/15   [name], MD  hydrOXYzine (ATARAX/VISTARIL) 10 MG tablet Take 1 tablet (10 mg total) by mouth 3 (three) times daily as needed for itching. 09/27/15   [name], MD  hyoscyamine (LEVSIN/SL) 0.125 MG SL tablet Place 1 tablet (0.125 mg total) under the tongue every 4 (four) hours as needed. 11/17/15   April Palumbo, MD  ibuprofen (ADVIL,MOTRIN) 600 MG tablet Take 1 tablet (600 mg total) by mouth every 8 (eight) hours as needed. 10/06/15   [name], MD  isosorbide mononitrate (IMDUR) 30 MG 24 hr tablet Take 3 tablets (90 mg total) by mouth daily. 09/11/15   [name], MD  levalbuterol [facility] HFA) 45 MCG/ACT inhaler Inhale 2 puffs into the lungs every 6 (six) hours as needed for wheezing or shortness of breath. 11/19/14   [name], MD  lisinopril (PRINIVIL,ZESTRIL) 20 MG tablet Take 1 tablet (20 mg total) by mouth daily. 02/04/15   [name], MD  multivitamin (RENA-VIT) TABS tablet Take 1 tablet by mouth at bedtime. 02/04/15   [name], MD  omeprazole (PRILOSEC) 20 MG capsule Take 20 mg by mouth daily.  07/01/13   Historical Provider, MD  ondansetron (ZOFRAN ODT) 4 MG disintegrating tablet Take 1 tablet (4 mg total) by mouth every 8 (eight) hours as needed for nausea or vomiting. 11/13/15   Carlisle Cater, PA-C  ondansetron (ZOFRAN ODT) 8 MG disintegrating tablet 87m ODT q8 hours prn nausea 11/17/15   April Palumbo, MD  ondansetron (ZOFRAN) 4 MG tablet Take 1 tablet (4 mg total) by mouth every 6 (six) hours. 10/29/15   COrpah Greek MD  oxyCODONE-acetaminophen (PERCOCET/ROXICET) 5-325 MG tablet Take 1-2 tablets by mouth every 6 (six) hours as needed for severe pain. 11/13/15   JCarlisle Cater PA-C  predniSONE (DELTASONE) 5 MG tablet Take 5 mg by mouth daily with breakfast.    Historical Provider, MD  sevelamer carbonate (RENVELA) 800 MG tablet Take 800-1,600 mg by mouth 3 (three) times daily  with meals. 2 tabs three times daily with meals, and 1 tablet with snacks    Historical Provider, MD  traMADol (ULTRAM) 50 MG tablet Take 1 tablet (50 mg total) by mouth every 6 (six) hours as needed for moderate pain. 11/04/15   [name], MD  warfarin (COUMADIN) 6 MG tablet Take 1.5 tablets (9 mg total) by mouth daily at 6 PM. Take daily until adjusted per MD 09/12/15   [name], MD   BP 175/102 mmHg  Pulse 78  Temp(Src) 97.3 F (36.3 C) (Oral)  Resp 18  Ht _0  (1.803 m)  Wt 160 lb (72.576 kg)  BMI 22.33 kg/m2  SpO2 99% Physical Exam  Constitutional: He is oriented to person, place, and time. He appears well-developed and well-nourished. No distress.  HENT:  Head: Normocephalic and atraumatic.  Eyes: Conjunctivae and EOM are normal.  Neck: Normal range of motion.  Cardiovascular: Normal rate, regular rhythm, normal heart sounds and intact distal pulses.  Exam reveals no gallop and no friction rub.   No murmur heard. Pulmonary/Chest: Effort normal and breath sounds normal. No respiratory distress. He has no wheezes. He has no rales.  Abdominal: Soft. He exhibits no distension. There  is no tenderness. There is CVA tenderness (left). There is no guarding.  Musculoskeletal: He exhibits no edema.  Neurological: He is alert and oriented to person, place, and time.  Skin: Skin is warm and dry. He is not diaphoretic.  Nursing note and vitals reviewed.   ED Course  Procedures (including critical care time) Labs Review Labs Reviewed - No data to display  Imaging Review Dg Abd Acute W/chest  11/17/2015  CLINICAL DATA:  Acute onset of generalized back and abdominal pain. Vomiting. Initial encounter. EXAM: DG ABDOMEN ACUTE W/ 1V CHEST COMPARISON:  Chest and abdominal radiographs performed 10/29/2015, and CT of the abdomen and pelvis performed 10/30/2015 FINDINGS: The lungs are well-aerated. Vascular congestion is noted. Increased interstitial markings raise concern for mild interstitial edema. There is no evidence of pleural effusion or pneumothorax. The cardiomediastinal silhouette is this borderline enlarged. A right-sided dual-lumen catheter is noted ending about the distal SVC. The visualized bowel gas pattern is unremarkable. Scattered stool and air are seen within the colon; there is no evidence of small bowel dilatation to suggest obstruction. No free intra-abdominal air is identified on the provided upright view. No acute osseous abnormalities are seen; the sacroiliac joints are unremarkable in appearance. IMPRESSION: 1. Unremarkable bowel gas pattern; no free intra-abdominal air seen. Small to moderate amount of stool noted in the colon. 2. Vascular congestion and mild cardiomegaly, without significant pulmonary edema. Electronically Signed   By: Garald Balding M.D.   On: 11/17/2015 04:02   I have personally reviewed and evaluated these images and lab results as part of my medical decision-making.   EKG Interpretation None      MDM   Final diagnoses:  Left renal mass  Chronic pain   50 year old male with a history of ESRD on dialysis Tuesday Thursday Saturday,  noncompliance, chronic pain, drug-seeking behavior, enlarging left renal mass concerning for renal cell carcinoma who was just seen in the emergency department and discharged 1 hour ago for concern of nausea, vomiting and abdominal pain who presents with left flank pain. Patient had received Toradol, Bentyl, zofran and was able to tolerate by mouth fluids prior to discharge, but immediately checked back into the emergency department.  Reports vomiting has improved however pain continues.  Discussed with patient  his history of drug-seeking behavior, concern for opiate addiction, and hyperalgesia that occurs with chronic opiate use.  Patient, however is reporting severe pain, and given history of likely renal cancer as etiology, we'll provide one dose of IM Dilaudid. Recommend patient call urology today to schedule appointment with Dr. Diona Fanti for outpatient nephrectomy.  Doubt other etiology of pain, labs with mild hyperk this AM 5.2, with pt scheduled for dialysis tomorrow.  Gareth Morgan, MD 11/17/15 [phone]

## 2015-11-17 NOTE — ED Notes (Signed)
Pt c/o lower back pain that radiates to the abdomen. Pt was just seen but states that he did not do anything and he wants some pain meds administered here not asking for a prescription.

## 2015-11-17 NOTE — ED Notes (Signed)
Tolerating [place] sandwich

## 2015-11-17 NOTE — Discharge Instructions (Signed)
Chronic Pain  Chronic pain can be defined as pain that is off and on and lasts for 3-6 months or longer. Many things cause chronic pain, which can make it difficult to make a diagnosis. There are many treatment options available for chronic pain. However, finding a treatment that works well for you may require trying various approaches until the right one is found. Many people benefit from a combination of two or more types of treatment to control their pain.  SYMPTOMS   Chronic pain can occur anywhere in the body and can range from mild to very severe. Some types of chronic pain include:  · Headache.  · Low back pain.  · Cancer pain.  · Arthritis pain.  · Neurogenic pain. This is pain resulting from damage to nerves.   People with chronic pain may also have other symptoms such as:  · Depression.  · Anger.  · Insomnia.  · Anxiety.  DIAGNOSIS   Your health care provider will help diagnose your condition over time. In many cases, the initial focus will be on excluding possible conditions that could be causing the pain. Depending on your symptoms, your health care provider may order tests to diagnose your condition. Some of these tests may include:   · Blood tests.    · CT scan.    · MRI.    · X-rays.    · Ultrasounds.    · Nerve conduction studies.    You may need to see a specialist.   TREATMENT   Finding treatment that works well may take time. You may be referred to a pain specialist. He or she may prescribe medicine or therapies, such as:   · Mindful meditation or yoga.  · Shots (injections) of numbing or pain-relieving medicines into the spine or area of pain.  · Local electrical stimulation.  · Acupuncture.    · Massage therapy.    · Aroma, color, light, or sound therapy.    · Biofeedback.    · Working with a physical therapist to keep from getting stiff.    · Regular, gentle exercise.    · Cognitive or behavioral therapy.    · Group support.    Sometimes, surgery may be recommended.   HOME CARE INSTRUCTIONS    · Take all medicines as directed by your health care provider.    · Lessen stress in your life by relaxing and doing things such as listening to calming music.    · Exercise or be active as directed by your health care provider.    · Eat a healthy diet and include things such as vegetables, fruits, fish, and lean meats in your diet.    · Keep all follow-up appointments with your health care provider.    · Attend a support group with others suffering from chronic pain.  SEEK MEDICAL CARE IF:   · Your pain gets worse.    · You develop a new pain that was not there before.    · You cannot tolerate medicines given to you by your health care provider.    · You have new symptoms since your last visit with your health care provider.    SEEK IMMEDIATE MEDICAL CARE IF:   · You feel weak.    · You have decreased sensation or numbness.    · You lose control of bowel or bladder function.    · Your pain suddenly gets much worse.    · You develop shaking.  · You develop chills.  · You develop confusion.  · You develop chest pain.  · You develop shortness of breath.    MAKE SURE YOU:  ·   Document Revised: 03/10/2013 Document Reviewed: 01/01/2013 Elsevier Interactive Patient Education [employer].

## 2015-11-18 DIAGNOSIS — D631 Anemia in chronic kidney disease: Secondary | ICD-10-CM | POA: Diagnosis not present

## 2015-11-18 DIAGNOSIS — N186 End stage renal disease: Secondary | ICD-10-CM | POA: Diagnosis not present

## 2015-11-18 DIAGNOSIS — N2581 Secondary hyperparathyroidism of renal origin: Secondary | ICD-10-CM | POA: Diagnosis not present

## 2015-11-18 DIAGNOSIS — Z418 Encounter for other procedures for purposes other than remedying health state: Secondary | ICD-10-CM | POA: Diagnosis not present

## 2015-11-19 DIAGNOSIS — Z992 Dependence on renal dialysis: Secondary | ICD-10-CM | POA: Diagnosis not present

## 2015-11-19 DIAGNOSIS — N186 End stage renal disease: Secondary | ICD-10-CM | POA: Diagnosis not present

## 2015-11-20 DIAGNOSIS — R109 Unspecified abdominal pain: Secondary | ICD-10-CM | POA: Diagnosis not present

## 2015-11-20 DIAGNOSIS — G8929 Other chronic pain: Secondary | ICD-10-CM | POA: Diagnosis not present

## 2015-11-20 DIAGNOSIS — Z888 Allergy status to other drugs, medicaments and biological substances status: Secondary | ICD-10-CM | POA: Diagnosis not present

## 2015-11-20 DIAGNOSIS — Z01818 Encounter for other preprocedural examination: Secondary | ICD-10-CM | POA: Diagnosis not present

## 2015-11-20 DIAGNOSIS — T8612 Kidney transplant failure: Secondary | ICD-10-CM | POA: Diagnosis not present

## 2015-11-20 DIAGNOSIS — Z9889 Other specified postprocedural states: Secondary | ICD-10-CM | POA: Diagnosis not present

## 2015-11-20 DIAGNOSIS — N186 End stage renal disease: Secondary | ICD-10-CM | POA: Diagnosis not present

## 2015-11-20 DIAGNOSIS — N2889 Other specified disorders of kidney and ureter: Secondary | ICD-10-CM | POA: Diagnosis not present

## 2015-11-20 DIAGNOSIS — Z992 Dependence on renal dialysis: Secondary | ICD-10-CM | POA: Diagnosis not present

## 2015-11-20 DIAGNOSIS — R1032 Left lower quadrant pain: Secondary | ICD-10-CM | POA: Diagnosis not present

## 2015-11-23 DIAGNOSIS — E8779 Other fluid overload: Secondary | ICD-10-CM | POA: Diagnosis not present

## 2015-11-23 DIAGNOSIS — D631 Anemia in chronic kidney disease: Secondary | ICD-10-CM | POA: Diagnosis not present

## 2015-11-23 DIAGNOSIS — N2581 Secondary hyperparathyroidism of renal origin: Secondary | ICD-10-CM | POA: Diagnosis not present

## 2015-11-23 DIAGNOSIS — E1139 Type 2 diabetes mellitus with other diabetic ophthalmic complication: Secondary | ICD-10-CM | POA: Diagnosis not present

## 2015-11-23 DIAGNOSIS — Z418 Encounter for other procedures for purposes other than remedying health state: Secondary | ICD-10-CM | POA: Diagnosis not present

## 2015-11-23 DIAGNOSIS — N186 End stage renal disease: Secondary | ICD-10-CM | POA: Diagnosis not present

## 2015-11-23 DIAGNOSIS — Z7901 Long term (current) use of anticoagulants: Secondary | ICD-10-CM | POA: Diagnosis not present

## 2015-11-25 DIAGNOSIS — Z7901 Long term (current) use of anticoagulants: Secondary | ICD-10-CM | POA: Diagnosis not present

## 2015-11-25 DIAGNOSIS — Z791 Long term (current) use of non-steroidal anti-inflammatories (NSAID): Secondary | ICD-10-CM | POA: Diagnosis not present

## 2015-11-25 DIAGNOSIS — E1122 Type 2 diabetes mellitus with diabetic chronic kidney disease: Secondary | ICD-10-CM | POA: Diagnosis not present

## 2015-11-25 DIAGNOSIS — N186 End stage renal disease: Secondary | ICD-10-CM | POA: Diagnosis not present

## 2015-11-25 DIAGNOSIS — I120 Hypertensive chronic kidney disease with stage 5 chronic kidney disease or end stage renal disease: Secondary | ICD-10-CM | POA: Diagnosis not present

## 2015-11-25 DIAGNOSIS — Z885 Allergy status to narcotic agent status: Secondary | ICD-10-CM | POA: Diagnosis not present

## 2015-11-25 DIAGNOSIS — Z888 Allergy status to other drugs, medicaments and biological substances status: Secondary | ICD-10-CM | POA: Diagnosis not present

## 2015-11-25 DIAGNOSIS — N2581 Secondary hyperparathyroidism of renal origin: Secondary | ICD-10-CM | POA: Diagnosis not present

## 2015-11-25 DIAGNOSIS — D649 Anemia, unspecified: Secondary | ICD-10-CM | POA: Diagnosis not present

## 2015-11-25 DIAGNOSIS — Z940 Kidney transplant status: Secondary | ICD-10-CM | POA: Diagnosis not present

## 2015-11-25 DIAGNOSIS — E8779 Other fluid overload: Secondary | ICD-10-CM | POA: Diagnosis not present

## 2015-11-25 DIAGNOSIS — N2889 Other specified disorders of kidney and ureter: Secondary | ICD-10-CM | POA: Diagnosis not present

## 2015-11-25 DIAGNOSIS — D631 Anemia in chronic kidney disease: Secondary | ICD-10-CM | POA: Diagnosis not present

## 2015-11-25 DIAGNOSIS — Z418 Encounter for other procedures for purposes other than remedying health state: Secondary | ICD-10-CM | POA: Diagnosis not present

## 2015-11-25 DIAGNOSIS — Z8249 Family history of ischemic heart disease and other diseases of the circulatory system: Secondary | ICD-10-CM | POA: Diagnosis not present

## 2015-11-25 DIAGNOSIS — I517 Cardiomegaly: Secondary | ICD-10-CM | POA: Diagnosis not present

## 2015-11-25 DIAGNOSIS — Z79899 Other long term (current) drug therapy: Secondary | ICD-10-CM | POA: Diagnosis not present

## 2015-11-25 DIAGNOSIS — Z992 Dependence on renal dialysis: Secondary | ICD-10-CM | POA: Diagnosis not present

## 2015-11-25 DIAGNOSIS — Z833 Family history of diabetes mellitus: Secondary | ICD-10-CM | POA: Diagnosis not present

## 2015-11-25 DIAGNOSIS — R0989 Other specified symptoms and signs involving the circulatory and respiratory systems: Secondary | ICD-10-CM | POA: Diagnosis not present

## 2015-11-26 DIAGNOSIS — D649 Anemia, unspecified: Secondary | ICD-10-CM | POA: Diagnosis not present

## 2015-11-26 DIAGNOSIS — R140 Abdominal distension (gaseous): Secondary | ICD-10-CM | POA: Diagnosis not present

## 2015-11-30 DIAGNOSIS — Z418 Encounter for other procedures for purposes other than remedying health state: Secondary | ICD-10-CM | POA: Diagnosis not present

## 2015-11-30 DIAGNOSIS — N186 End stage renal disease: Secondary | ICD-10-CM | POA: Diagnosis not present

## 2015-11-30 DIAGNOSIS — Z7901 Long term (current) use of anticoagulants: Secondary | ICD-10-CM | POA: Diagnosis not present

## 2015-11-30 DIAGNOSIS — D631 Anemia in chronic kidney disease: Secondary | ICD-10-CM | POA: Diagnosis not present

## 2015-11-30 DIAGNOSIS — N2581 Secondary hyperparathyroidism of renal origin: Secondary | ICD-10-CM | POA: Diagnosis not present

## 2015-11-30 DIAGNOSIS — E8779 Other fluid overload: Secondary | ICD-10-CM | POA: Diagnosis not present

## 2015-12-01 DIAGNOSIS — N186 End stage renal disease: Secondary | ICD-10-CM | POA: Diagnosis not present

## 2015-12-01 DIAGNOSIS — D631 Anemia in chronic kidney disease: Secondary | ICD-10-CM | POA: Diagnosis not present

## 2015-12-01 DIAGNOSIS — N2581 Secondary hyperparathyroidism of renal origin: Secondary | ICD-10-CM | POA: Diagnosis not present

## 2015-12-01 DIAGNOSIS — E8779 Other fluid overload: Secondary | ICD-10-CM | POA: Diagnosis not present

## 2015-12-01 DIAGNOSIS — Z418 Encounter for other procedures for purposes other than remedying health state: Secondary | ICD-10-CM | POA: Diagnosis not present

## 2015-12-02 DIAGNOSIS — R1013 Epigastric pain: Secondary | ICD-10-CM | POA: Diagnosis not present

## 2015-12-02 DIAGNOSIS — I120 Hypertensive chronic kidney disease with stage 5 chronic kidney disease or end stage renal disease: Secondary | ICD-10-CM | POA: Diagnosis not present

## 2015-12-02 DIAGNOSIS — N2889 Other specified disorders of kidney and ureter: Secondary | ICD-10-CM | POA: Diagnosis not present

## 2015-12-02 DIAGNOSIS — E875 Hyperkalemia: Secondary | ICD-10-CM | POA: Diagnosis not present

## 2015-12-02 DIAGNOSIS — T8611 Kidney transplant rejection: Secondary | ICD-10-CM | POA: Diagnosis not present

## 2015-12-02 DIAGNOSIS — R188 Other ascites: Secondary | ICD-10-CM | POA: Diagnosis not present

## 2015-12-02 DIAGNOSIS — Z940 Kidney transplant status: Secondary | ICD-10-CM | POA: Diagnosis not present

## 2015-12-02 DIAGNOSIS — R918 Other nonspecific abnormal finding of lung field: Secondary | ICD-10-CM | POA: Diagnosis not present

## 2015-12-02 DIAGNOSIS — R590 Localized enlarged lymph nodes: Secondary | ICD-10-CM | POA: Diagnosis not present

## 2015-12-02 DIAGNOSIS — N186 End stage renal disease: Secondary | ICD-10-CM | POA: Diagnosis not present

## 2015-12-02 DIAGNOSIS — Z87891 Personal history of nicotine dependence: Secondary | ICD-10-CM | POA: Diagnosis not present

## 2015-12-02 DIAGNOSIS — C641 Malignant neoplasm of right kidney, except renal pelvis: Secondary | ICD-10-CM | POA: Diagnosis not present

## 2015-12-03 DIAGNOSIS — T8612 Kidney transplant failure: Secondary | ICD-10-CM | POA: Diagnosis not present

## 2015-12-03 DIAGNOSIS — I120 Hypertensive chronic kidney disease with stage 5 chronic kidney disease or end stage renal disease: Secondary | ICD-10-CM | POA: Diagnosis not present

## 2015-12-03 DIAGNOSIS — N186 End stage renal disease: Secondary | ICD-10-CM | POA: Diagnosis not present

## 2015-12-03 DIAGNOSIS — Z992 Dependence on renal dialysis: Secondary | ICD-10-CM | POA: Diagnosis not present

## 2015-12-04 DIAGNOSIS — G8929 Other chronic pain: Secondary | ICD-10-CM | POA: Diagnosis present

## 2015-12-04 DIAGNOSIS — Z833 Family history of diabetes mellitus: Secondary | ICD-10-CM | POA: Diagnosis not present

## 2015-12-04 DIAGNOSIS — Z992 Dependence on renal dialysis: Secondary | ICD-10-CM | POA: Diagnosis not present

## 2015-12-04 DIAGNOSIS — C641 Malignant neoplasm of right kidney, except renal pelvis: Secondary | ICD-10-CM | POA: Diagnosis present

## 2015-12-04 DIAGNOSIS — T8611 Kidney transplant rejection: Secondary | ICD-10-CM | POA: Diagnosis present

## 2015-12-04 DIAGNOSIS — N186 End stage renal disease: Secondary | ICD-10-CM | POA: Diagnosis not present

## 2015-12-04 DIAGNOSIS — M109 Gout, unspecified: Secondary | ICD-10-CM | POA: Diagnosis present

## 2015-12-04 DIAGNOSIS — Z86711 Personal history of pulmonary embolism: Secondary | ICD-10-CM | POA: Diagnosis not present

## 2015-12-04 DIAGNOSIS — E875 Hyperkalemia: Secondary | ICD-10-CM | POA: Diagnosis present

## 2015-12-04 DIAGNOSIS — Z8249 Family history of ischemic heart disease and other diseases of the circulatory system: Secondary | ICD-10-CM | POA: Diagnosis not present

## 2015-12-04 DIAGNOSIS — I120 Hypertensive chronic kidney disease with stage 5 chronic kidney disease or end stage renal disease: Secondary | ICD-10-CM | POA: Diagnosis not present

## 2015-12-04 DIAGNOSIS — Z940 Kidney transplant status: Secondary | ICD-10-CM | POA: Diagnosis not present

## 2015-12-04 DIAGNOSIS — T8612 Kidney transplant failure: Secondary | ICD-10-CM | POA: Diagnosis not present

## 2015-12-04 DIAGNOSIS — Z8042 Family history of malignant neoplasm of prostate: Secondary | ICD-10-CM | POA: Diagnosis not present

## 2015-12-04 DIAGNOSIS — Z7901 Long term (current) use of anticoagulants: Secondary | ICD-10-CM | POA: Diagnosis not present

## 2015-12-04 DIAGNOSIS — E785 Hyperlipidemia, unspecified: Secondary | ICD-10-CM | POA: Diagnosis present

## 2015-12-04 DIAGNOSIS — R109 Unspecified abdominal pain: Secondary | ICD-10-CM | POA: Diagnosis present

## 2015-12-04 DIAGNOSIS — Z841 Family history of disorders of kidney and ureter: Secondary | ICD-10-CM | POA: Diagnosis not present

## 2015-12-04 DIAGNOSIS — Z86718 Personal history of other venous thrombosis and embolism: Secondary | ICD-10-CM | POA: Diagnosis not present

## 2015-12-04 DIAGNOSIS — I4510 Unspecified right bundle-branch block: Secondary | ICD-10-CM | POA: Diagnosis not present

## 2015-12-04 DIAGNOSIS — G4733 Obstructive sleep apnea (adult) (pediatric): Secondary | ICD-10-CM | POA: Diagnosis present

## 2015-12-04 DIAGNOSIS — Z87891 Personal history of nicotine dependence: Secondary | ICD-10-CM | POA: Diagnosis not present

## 2015-12-04 DIAGNOSIS — K219 Gastro-esophageal reflux disease without esophagitis: Secondary | ICD-10-CM | POA: Diagnosis present

## 2015-12-05 IMAGING — CR DG CHEST 2V
2 series · 2 of 2 positions shown · non-contrast
Comparison: Chest radiograph performed 08/31/2012

CLINICAL DATA: Shortness of breath and productive cough.

EXAM:
CHEST  2 VIEW

[w chest pa]
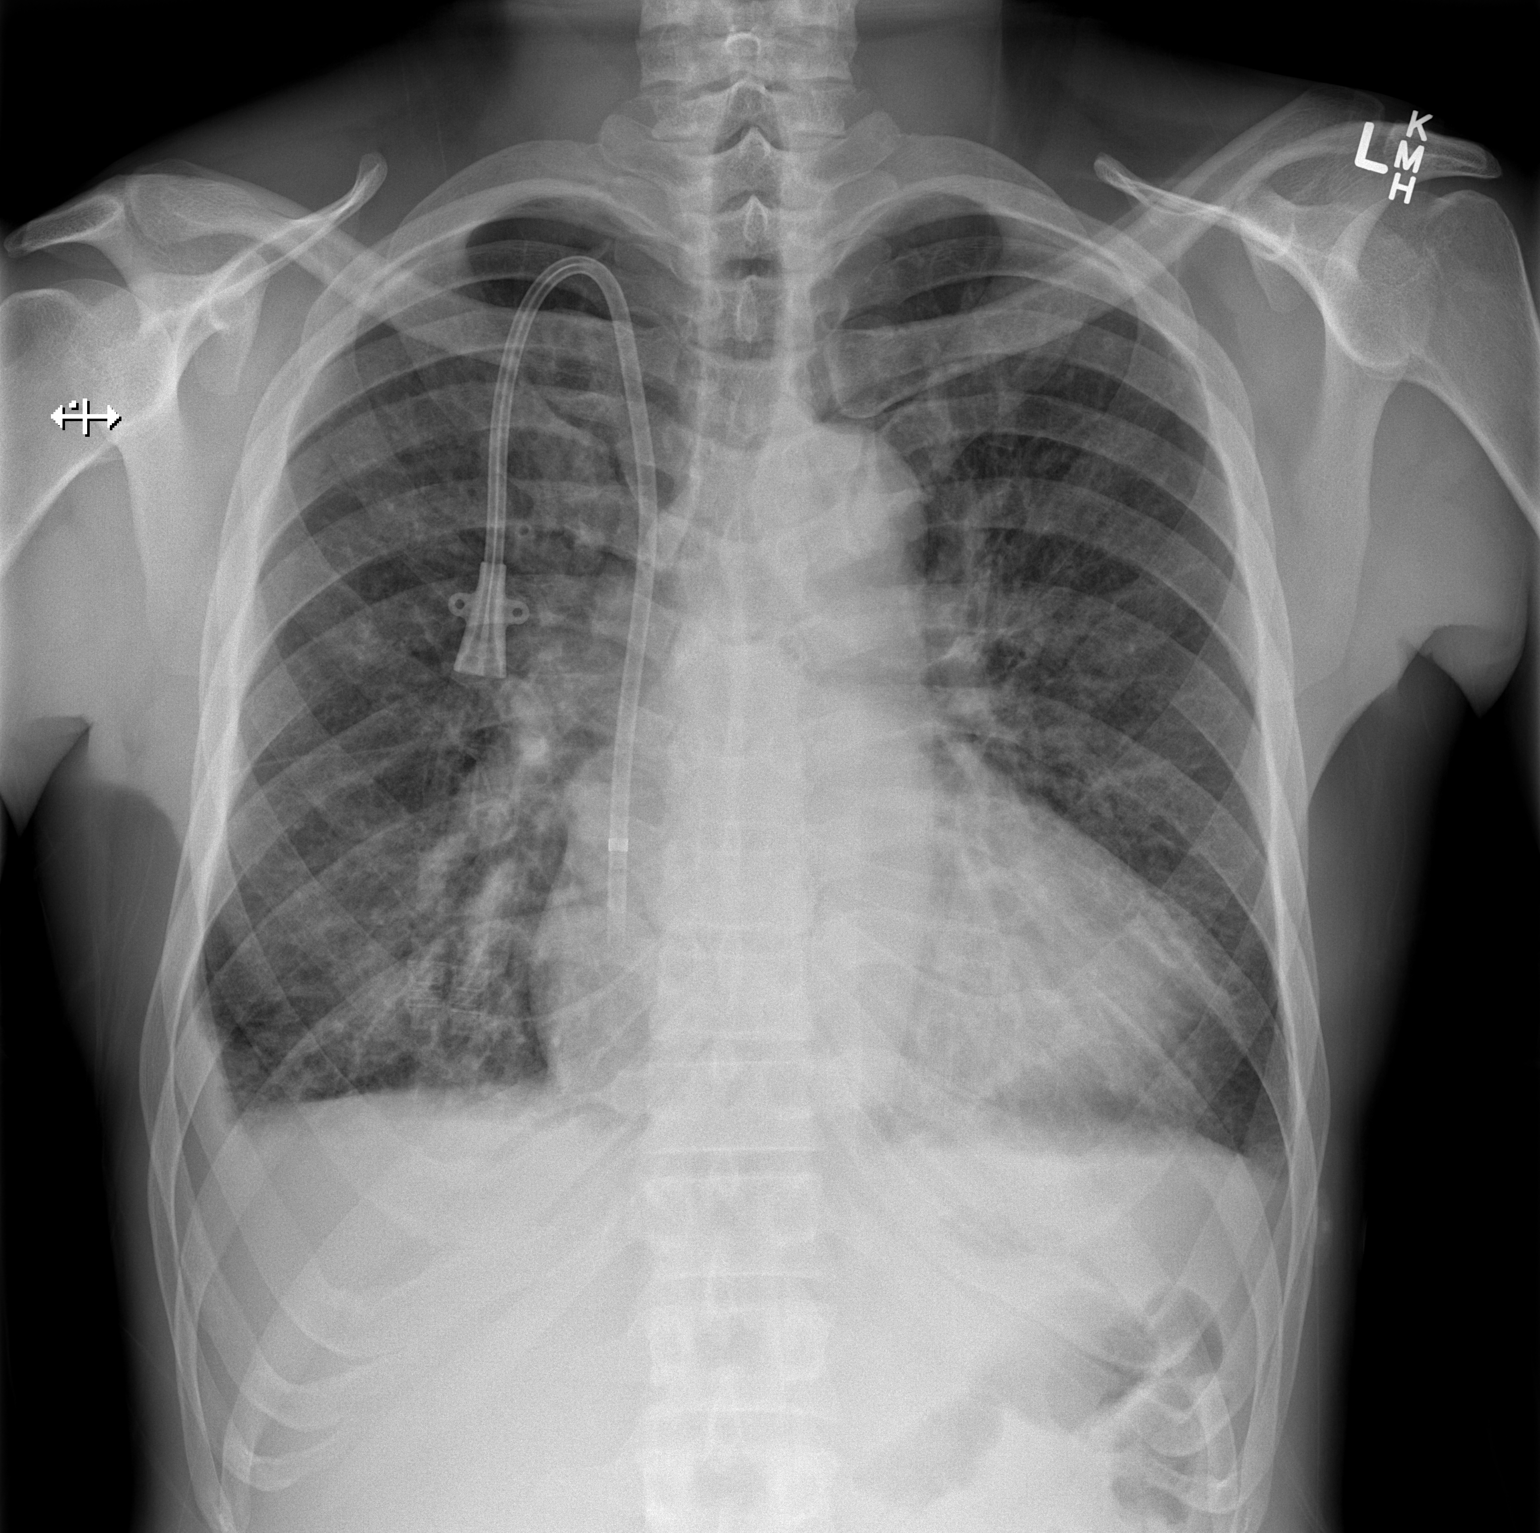

[w chest lat]
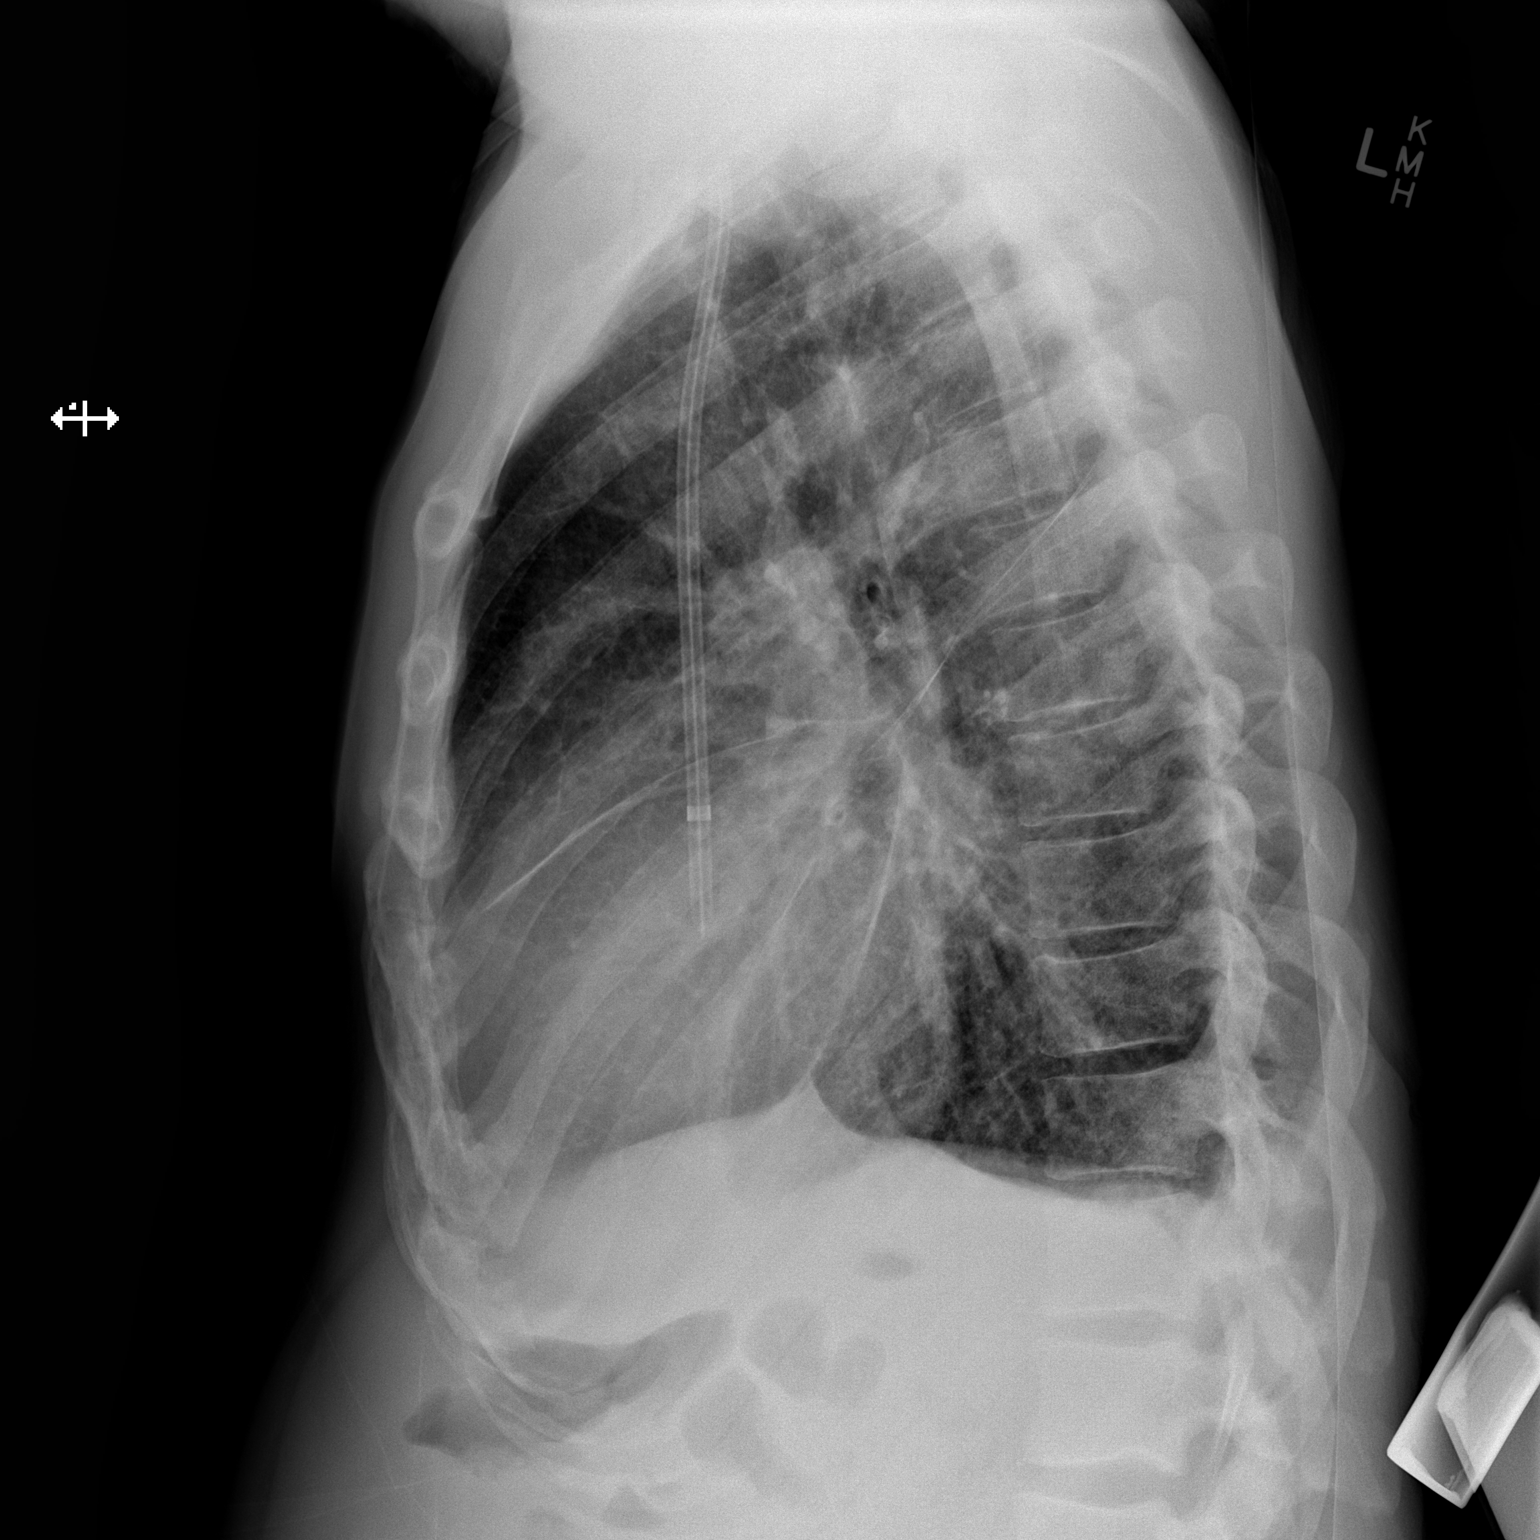

[2 of 2 positions shown; findings below may reference images not displayed]

FINDINGS: The lungs are well-aerated. Vascular congestion is noted, with
bilateral central airspace opacities, likely reflecting mild
pulmonary edema. A small right pleural effusion is seen. Pneumonia
might have a similar appearance. No pneumothorax is identified.

The heart is mildly enlarged. No acute osseous abnormalities are
seen. A right sided dual-lumen catheter is noted ending within the
right atrium.
IMPRESSION: Vascular congestion and mild cardiomegaly, with bilateral central
airspace opacities, likely reflecting mild pulmonary edema. Small
right pleural effusion seen. Pneumonia might have a similar
appearance.

## 2015-12-07 DIAGNOSIS — Z7901 Long term (current) use of anticoagulants: Secondary | ICD-10-CM | POA: Diagnosis not present

## 2015-12-07 DIAGNOSIS — Z418 Encounter for other procedures for purposes other than remedying health state: Secondary | ICD-10-CM | POA: Diagnosis not present

## 2015-12-07 DIAGNOSIS — N186 End stage renal disease: Secondary | ICD-10-CM | POA: Diagnosis not present

## 2015-12-07 DIAGNOSIS — N2581 Secondary hyperparathyroidism of renal origin: Secondary | ICD-10-CM | POA: Diagnosis not present

## 2015-12-07 DIAGNOSIS — D631 Anemia in chronic kidney disease: Secondary | ICD-10-CM | POA: Diagnosis not present

## 2015-12-07 DIAGNOSIS — E8779 Other fluid overload: Secondary | ICD-10-CM | POA: Diagnosis not present

## 2015-12-09 ENCOUNTER — Emergency Department (HOSPITAL_COMMUNITY): Payer: Medicare Other

## 2015-12-09 ENCOUNTER — Emergency Department (HOSPITAL_COMMUNITY)
Admission: EM | Admit: 2015-12-09 | Discharge: 2015-12-09 | Disposition: A | Discharge status: Home / Self Care | Payer: Medicare Other | Attending: Emergency Medicine | Admitting: Emergency Medicine

## 2015-12-09 ENCOUNTER — Encounter (HOSPITAL_COMMUNITY): Payer: Self-pay | Admitting: Emergency Medicine

## 2015-12-09 DIAGNOSIS — Z992 Dependence on renal dialysis: Secondary | ICD-10-CM | POA: Diagnosis not present

## 2015-12-09 DIAGNOSIS — Z87891 Personal history of nicotine dependence: Secondary | ICD-10-CM | POA: Insufficient documentation

## 2015-12-09 DIAGNOSIS — Z885 Allergy status to narcotic agent status: Secondary | ICD-10-CM | POA: Diagnosis not present

## 2015-12-09 DIAGNOSIS — I120 Hypertensive chronic kidney disease with stage 5 chronic kidney disease or end stage renal disease: Secondary | ICD-10-CM | POA: Insufficient documentation

## 2015-12-09 DIAGNOSIS — R0602 Shortness of breath: Secondary | ICD-10-CM | POA: Diagnosis not present

## 2015-12-09 DIAGNOSIS — Z862 Personal history of diseases of the blood and blood-forming organs and certain disorders involving the immune mechanism: Secondary | ICD-10-CM | POA: Diagnosis not present

## 2015-12-09 DIAGNOSIS — Z7952 Long term (current) use of systemic steroids: Secondary | ICD-10-CM | POA: Insufficient documentation

## 2015-12-09 DIAGNOSIS — I160 Hypertensive urgency: Secondary | ICD-10-CM | POA: Diagnosis not present

## 2015-12-09 DIAGNOSIS — J9811 Atelectasis: Secondary | ICD-10-CM | POA: Diagnosis not present

## 2015-12-09 DIAGNOSIS — F329 Major depressive disorder, single episode, unspecified: Secondary | ICD-10-CM | POA: Insufficient documentation

## 2015-12-09 DIAGNOSIS — R188 Other ascites: Secondary | ICD-10-CM | POA: Diagnosis not present

## 2015-12-09 DIAGNOSIS — F419 Anxiety disorder, unspecified: Secondary | ICD-10-CM | POA: Diagnosis not present

## 2015-12-09 DIAGNOSIS — Z7901 Long term (current) use of anticoagulants: Secondary | ICD-10-CM | POA: Insufficient documentation

## 2015-12-09 DIAGNOSIS — R0789 Other chest pain: Secondary | ICD-10-CM | POA: Diagnosis not present

## 2015-12-09 DIAGNOSIS — M549 Dorsalgia, unspecified: Secondary | ICD-10-CM | POA: Diagnosis not present

## 2015-12-09 DIAGNOSIS — Z79899 Other long term (current) drug therapy: Secondary | ICD-10-CM | POA: Insufficient documentation

## 2015-12-09 DIAGNOSIS — I517 Cardiomegaly: Secondary | ICD-10-CM | POA: Diagnosis not present

## 2015-12-09 DIAGNOSIS — Z886 Allergy status to analgesic agent status: Secondary | ICD-10-CM | POA: Diagnosis not present

## 2015-12-09 DIAGNOSIS — N186 End stage renal disease: Secondary | ICD-10-CM | POA: Insufficient documentation

## 2015-12-09 DIAGNOSIS — Z940 Kidney transplant status: Secondary | ICD-10-CM | POA: Insufficient documentation

## 2015-12-09 DIAGNOSIS — R079 Chest pain, unspecified: Secondary | ICD-10-CM | POA: Diagnosis not present

## 2015-12-09 DIAGNOSIS — Z888 Allergy status to other drugs, medicaments and biological substances status: Secondary | ICD-10-CM | POA: Diagnosis not present

## 2015-12-09 DIAGNOSIS — E1122 Type 2 diabetes mellitus with diabetic chronic kidney disease: Secondary | ICD-10-CM | POA: Diagnosis not present

## 2015-12-09 DIAGNOSIS — J90 Pleural effusion, not elsewhere classified: Secondary | ICD-10-CM | POA: Diagnosis not present

## 2015-12-09 DIAGNOSIS — N2889 Other specified disorders of kidney and ureter: Secondary | ICD-10-CM | POA: Diagnosis not present

## 2015-12-09 DIAGNOSIS — I509 Heart failure, unspecified: Secondary | ICD-10-CM | POA: Insufficient documentation

## 2015-12-09 DIAGNOSIS — J811 Chronic pulmonary edema: Secondary | ICD-10-CM | POA: Diagnosis not present

## 2015-12-09 LAB — BASIC METABOLIC PANEL
Anion gap: 14 (ref 5–15)
BUN: 37 mg/dL — ABNORMAL HIGH (ref 6–20)
CHLORIDE: 103 mmol/L (ref 101–111)
CO2: 24 mmol/L (ref 22–32)
CREATININE: 10.49 mg/dL — AB (ref 0.61–1.24)
Calcium: 8.8 mg/dL — ABNORMAL LOW (ref 8.9–10.3)
GFR calc non Af Amer: 5 mL/min — ABNORMAL LOW (ref >60)
GFR, EST AFRICAN AMERICAN: 6 mL/min — AB (ref >60)
Glucose, Bld: 88 mg/dL (ref 65–99)
POTASSIUM: 5.1 mmol/L (ref 3.5–5.1)
Sodium: 141 mmol/L (ref 135–145)

## 2015-12-09 LAB — CBC
HEMATOCRIT: 29 % — AB (ref 39.0–52.0)
Hemoglobin: 8.9 g/dL — ABNORMAL LOW (ref 13.0–17.0)
MCH: 27 pg (ref 26.0–34.0)
MCHC: 30.7 g/dL (ref 30.0–36.0)
MCV: 87.9 fL (ref 78.0–100.0)
PLATELETS: 167 10*3/uL (ref 150–400)
RBC: 3.3 MIL/uL — AB (ref 4.22–5.81)
RDW: 16.6 % — ABNORMAL HIGH (ref 11.5–15.5)
WBC: 8.4 10*3/uL (ref 4.0–10.5)

## 2015-12-09 LAB — TROPONIN I
TROPONIN I: 0.06 ng/mL — AB (ref <0.031)
Troponin I: 0.06 ng/mL — ABNORMAL HIGH (ref <0.031)

## 2015-12-09 LAB — I-STAT TROPONIN, ED: Troponin i, poc: 0.08 ng/mL (ref 0.00–0.08)

## 2015-12-09 LAB — PROTIME-INR
INR: 1.42 (ref 0.00–1.49)
PROTHROMBIN TIME: 17.4 s — AB (ref 11.6–15.2)

## 2015-12-09 LAB — MAGNESIUM: Magnesium: 1.6 mg/dL — ABNORMAL LOW (ref 1.7–2.4)

## 2015-12-09 MED ORDER — LISINOPRIL 10 MG PO TABS
10.0000 mg | ORAL_TABLET | Freq: Once | ORAL | Status: AC
  Administered 2015-12-09: 10 mg via ORAL
  Filled 2015-12-09: qty 1

## 2015-12-09 MED ORDER — ASPIRIN 81 MG PO CHEW
324.0000 mg | CHEWABLE_TABLET | Freq: Once | ORAL | Status: AC
  Administered 2015-12-09: 324 mg via ORAL
  Filled 2015-12-09: qty 4

## 2015-12-09 MED ORDER — OXYCODONE-ACETAMINOPHEN 5-325 MG PO TABS
2.0000 | ORAL_TABLET | Freq: Once | ORAL | Status: AC
  Administered 2015-12-09: 2 via ORAL
  Filled 2015-12-09: qty 2

## 2015-12-09 MED ORDER — FENTANYL CITRATE (PF) 100 MCG/2ML IJ SOLN
50.0000 ug | Freq: Once | INTRAMUSCULAR | Status: AC
  Administered 2015-12-09: 50 ug via INTRAVENOUS
  Filled 2015-12-09: qty 2

## 2015-12-09 MED ORDER — HYDRALAZINE HCL 20 MG/ML IJ SOLN
20.0000 mg | Freq: Once | INTRAMUSCULAR | Status: AC
  Administered 2015-12-09: 20 mg via INTRAVENOUS
  Filled 2015-12-09: qty 1

## 2015-12-09 MED ORDER — ISOSORBIDE MONONITRATE 10 MG PO TABS
10.0000 mg | ORAL_TABLET | Freq: Once | ORAL | Status: AC
  Administered 2015-12-09: 10 mg via ORAL
  Filled 2015-12-09: qty 1

## 2015-12-09 MED ORDER — ONDANSETRON HCL 4 MG/2ML IJ SOLN
4.0000 mg | Freq: Once | INTRAMUSCULAR | Status: AC
  Administered 2015-12-09: 4 mg via INTRAVENOUS
  Filled 2015-12-09: qty 2

## 2015-12-09 NOTE — Discharge Instructions (Signed)
Please be sure to go to your dialysis appointment on Monday.  Return here for concerning changes in the interim.

## 2015-12-09 NOTE — ED Notes (Signed)
Pt presents with LEFT sided CP and SOB that began last night; pt reports pain radiates to back; pt also states he is due for dialysis today

## 2015-12-09 NOTE — ED Notes (Signed)
Pt stated "you need to remove my IV or I will." This RN removed IV and explained that staff would review d/c papers in a moment. Pt left before able to give papers to pt.

## 2015-12-09 NOTE — ED Provider Notes (Signed)
CSN: [id number]     Arrival date & time 12/09/15  7893 History   First MD Initiated Contact with Patient 12/09/15 [phone]     Chief Complaint  Patient presents with  . Shortness of Breath  . Chest Pain  . Back Pain  . Vascular Access Problem    needs dialysis     HPI   Patient presents with concern of chest pain, back pain. Pain began yesterday. Since onset symptoms of been persistent, severe, present in both areas. Patient also complains of difficulty breathing, generalized discomfort. Patient went to dialysis today, was found to be hypertensive.  He was sent here for evaluation. No relief with Tylenol or anything. No other new complaints.   Past Medical History  Diagnosis Date  . Hypertension   . Depression   . Complication of anesthesia     itching, sore throat  . Shortness of breath   . Anxiety   . CHF (congestive heart failure) ([place])   . Anemia   . Dialysis patient ([place])   . Type II diabetes mellitus (HCC)     No history per patient, but remains under history as A1c would not be accurate given on dialysis  . ESRD (end stage renal disease) ([place])     due to HTN per patient, followed at [facility], s/p failed kidney transplant - dialysis Tue, Th, Sat  . Renal insufficiency    Past Surgical History  Procedure Laterality Date  . Kidney receipient  2006    failed and started HD in March 2014  . Capd insertion    . Capd removal    . Left heart catheterization with coronary angiogram N/A 09/02/2014    Procedure: LEFT HEART CATHETERIZATION WITH CORONARY ANGIOGRAM;  Surgeon: [name], MD;  Location: [facility] CATH LAB;  Service: Cardiovascular;  Laterality: N/A;  . Inguinal hernia repair Right 02/14/2015    Procedure: REPAIR INCARCERATED RIGHT INGUINAL HERNIA;  Surgeon: [name], MD;  Location: [place];  Service: General;  Laterality: Right;  . Insertion of dialysis catheter Right 09/23/2015    Procedure: exchange of Right internal Dialysis Catheter.;  Surgeon: [name],  MD;  Location: [facility] OR;  Service: Vascular;  Laterality: Right;   Family History  Problem Relation Age of Onset  . Hypertension Other    Social History  Substance Use Topics  . Smoking status: Former Smoker -- 0.00 packs/day for 1 years    Types: Cigarettes  . Smokeless tobacco: Never Used     Comment: quit Jan 2014  . Alcohol Use: No    Review of Systems  Constitutional:       Per HPI, otherwise negative  HENT:       Per HPI, otherwise negative  Respiratory:       Per HPI, otherwise negative  Cardiovascular:       Per HPI, otherwise negative  Gastrointestinal: Negative for vomiting.  Endocrine:       Negative aside from HPI  Genitourinary:       Neg aside from HPI   Musculoskeletal:       Per HPI, otherwise negative  Skin: Negative.   Allergic/Immunologic: Positive for immunocompromised state.  Neurological: Negative for syncope.      Allergies  Butalbital-apap-caffeine; Ferrlecit; Minoxidil; and Darvocet  Home Medications   Prior to Admission medications   Medication Sig Start Date End Date Taking? Authorizing Provider  ALPRAZolam [name]) 0.5 MG tablet Take 1 tablet (0.5 mg total) by mouth 2 (two) times daily  as needed for anxiety. 11/04/15   [name], MD  amLODipine (NORVASC) 10 MG tablet Take 10 mg by mouth daily.    Historical Provider, MD  atorvastatin (LIPITOR) 40 MG tablet Take 1 tablet (40 mg total) by mouth daily at 6 PM. 09/02/14   Barton Dubois, MD  carvedilol (COREG) 12.5 MG tablet Take 1 tablet (12.5 mg total) by mouth 2 (two) times daily with a meal. 11/04/15   [name], MD  cinacalcet (SENSIPAR) 30 MG tablet Take 30 mg by mouth daily.    Historical Provider, MD  cyclobenzaprine (FLEXERIL) 10 MG tablet Take 1 tablet (10 mg total) by mouth 3 (three) times daily as needed for muscle spasms. 11/04/15   [name], MD  docusate sodium (COLACE) 100 MG capsule Take 1 capsule (100 mg total) by mouth 2 (two) times daily. 11/19/14   [name], MD   doxercalciferol (HECTOROL) 4 MCG/2ML injection Inject 1.25 mLs (2.5 mcg total) into the vein Every Tuesday,Thursday,and Saturday with dialysis. 10/10/14   [name], DO  gabapentin (NEURONTIN) 100 MG capsule Take 100 mg by mouth 3 (three) times daily. 06/14/15 10/30/15  Historical Provider, MD  gabapentin (NEURONTIN) 100 MG capsule Take 1 capsule (100 mg total) by mouth 3 (three) times daily. 11/04/15 03/21/16  [name], MD  hydrALAZINE (APRESOLINE) 50 MG tablet Take 2 tablets (100 mg total) by mouth every 8 (eight) hours. 09/11/15   [name], MD  hydrocerin (EUCERIN) CREA Apply 1 application topically 2 (two) times daily. 02/04/15   [name], MD  hydrOXYzine (ATARAX/VISTARIL) 10 MG tablet Take 1 tablet (10 mg total) by mouth 3 (three) times daily as needed for itching. 09/27/15   [name], MD  hyoscyamine (LEVSIN/SL) 0.125 MG SL tablet Place 1 tablet (0.125 mg total) under the tongue every 4 (four) hours as needed. 11/17/15   April Palumbo, MD  ibuprofen (ADVIL,MOTRIN) 600 MG tablet Take 1 tablet (600 mg total) by mouth every 8 (eight) hours as needed. 10/06/15   [name], MD  isosorbide mononitrate (IMDUR) 30 MG 24 hr tablet Take 3 tablets (90 mg total) by mouth daily. 09/11/15   [name], MD  levalbuterol [facility] HFA) 45 MCG/ACT inhaler Inhale 2 puffs into the lungs every 6 (six) hours as needed for wheezing or shortness of breath. 11/19/14   [name], MD  lisinopril (PRINIVIL,ZESTRIL) 20 MG tablet Take 1 tablet (20 mg total) by mouth daily. 02/04/15   [name], MD  multivitamin (RENA-VIT) TABS tablet Take 1 tablet by mouth at bedtime. 02/04/15   [name], MD  omeprazole (PRILOSEC) 20 MG capsule Take 20 mg by mouth daily. 07/01/13   Historical Provider, MD  ondansetron (ZOFRAN ODT) 4 MG disintegrating tablet Take 1 tablet (4 mg total) by mouth every 8 (eight) hours as needed for nausea or vomiting. 11/13/15   Carlisle Cater, PA-C  ondansetron (ZOFRAN  ODT) 8 MG disintegrating tablet 99m ODT q8 hours prn nausea 11/17/15   April Palumbo, MD  ondansetron (ZOFRAN) 4 MG tablet Take 1 tablet (4 mg total) by mouth every 6 (six) hours. 10/29/15   COrpah Greek MD  oxyCODONE-acetaminophen (PERCOCET/ROXICET) 5-325 MG tablet Take 1-2 tablets by mouth every 6 (six) hours as needed for severe pain. 11/13/15   JCarlisle Cater PA-C  predniSONE (DELTASONE) 5 MG tablet Take 5 mg by mouth daily with breakfast.    Historical Provider, MD  sevelamer carbonate (RENVELA)  800 MG tablet Take 800-1,600 mg by mouth 3 (three) times daily with meals. 2 tabs three times daily with meals, and 1 tablet with snacks    Historical Provider, MD  traMADol (ULTRAM) 50 MG tablet Take 1 tablet (50 mg total) by mouth every 6 (six) hours as needed for moderate pain. 11/04/15   [name], MD  warfarin (COUMADIN) 6 MG tablet Take 1.5 tablets (9 mg total) by mouth daily at 6 PM. Take daily until adjusted per MD 09/12/15   [name], MD   BP 223/127 mmHg  Pulse 79  Temp(Src) 97.8 F (36.6 C) (Oral)  Resp 18  Ht _0  (1.88 m)  Wt 160 lb (72.576 kg)  BMI 20.53 kg/m2  SpO2 100% Physical Exam  Constitutional: He is oriented to person, place, and time. He appears well-developed. No distress.  HENT:  Head: Normocephalic and atraumatic.  Eyes: Conjunctivae and EOM are normal.  Cardiovascular: Normal rate and regular rhythm.   Pulmonary/Chest: Effort normal. No stridor. No respiratory distress.    Abdominal: He exhibits no distension.  Musculoskeletal: He exhibits no edema.  Neurological: He is alert and oriented to person, place, and time.  Skin: Skin is warm and dry.  Psychiatric: His mood appears anxious.  Nursing note and vitals reviewed.   ED Course  Procedures (including critical care time) Labs Review Labs Reviewed  BASIC METABOLIC PANEL - Abnormal; Notable for the following:    BUN 37 (*)    Creatinine, Ser 10.49 (*)    Calcium 8.8 (*)    GFR calc non  Af Amer 5 (*)    GFR calc Af Amer 6 (*)    All other components within normal limits  CBC - Abnormal; Notable for the following:    RBC 3.30 (*)    Hemoglobin 8.9 (*)    HCT 29.0 (*)    RDW 16.6 (*)    All other components within normal limits  MAGNESIUM - Abnormal; Notable for the following:    Magnesium 1.6 (*)    All other components within normal limits  PROTIME-INR - Abnormal; Notable for the following:    Prothrombin Time 17.4 (*)    All other components within normal limits  TROPONIN I - Abnormal; Notable for the following:    Troponin I 0.06 (*)    All other components within normal limits  TROPONIN I - Abnormal; Notable for the following:    Troponin I 0.06 (*)    All other components within normal limits  I-STAT TROPOININ, ED    Imaging Review Dg Chest 2 View  12/09/2015  CLINICAL DATA:  Pt presents with LEFT sided CP and SOB that began last night; pt reports pain radiates to back; pt also states he is due for dialysis today EXAM: CHEST  2 VIEW COMPARISON:  11/17/2015 FINDINGS: Moderate enlargement of the cardiopericardial silhouette. No mediastinal or hilar masses or evidence of adenopathy. Lungs are clear.  No pleural effusion or pneumothorax. Right-sided tunneled central venous catheter tip projects at the caval atrial junction, stable. Skeletal structures are unremarkable. IMPRESSION: 1. No acute cardiopulmonary disease. 2. Moderate cardiomegaly. Electronically Signed   By: Lajean Manes M.D.   On: 12/09/2015 08:13   I have personally reviewed and evaluated these images and lab results as part of my medical decision-making.   EKG Interpretation   Date/Time:  Saturday Dec 09 2015 06:45:05 EDT Ventricular Rate:  85 PR Interval:  196 QRS Duration: 93 QT Interval:  413  QTC Calculation: 491 R Axis:   -25 Text Interpretation:  Sinus rhythm Probable left atrial enlargement LVH  with secondary repolarization abnormality Borderline prolonged QT interval  Baseline wander  in lead(s) I III aVL V3 No significant change since last  tracing Abnormal ekg Confirmed by Carmin Muskrat  MD [phone]) on 12/09/2015  7:22:32 AM     Chart review notable for 25 ED visits in 6 months.  12:57 PM Patient appears comfortable, no distress, blood pressure is now 150/95. Remainder vital signs unremarkable. Labs reviewed with the patient, need for dialysis on Monday reviewed with the patient.   MDM  Patient with multiple medical issues including chronic pain, end-stage renal disease presents with concern of ongoing pain, and hypertension. Here the patient is initially quite hypertensive, but it seems as though he has not been taking his medicine regularly. No evidence for ongoing coronary ischemia, with multiple negative troponins, unchanged EKG. No evidence for ongoing infection, nor central fluid overload. No evidence for substantial electrolyte abnormality. Patient's blood pressure decreased appropriately, after multiple medication, hydralazine 2, nitrate, lisinopril. With improvement in vital signs, no ongoing substantial complaints, no evidence for new pathology, patient discharged to follow-up with dialysis in 2 days.  Carmin Muskrat, MD 12/09/15 1259

## 2015-12-10 DIAGNOSIS — R0789 Other chest pain: Secondary | ICD-10-CM | POA: Diagnosis not present

## 2015-12-10 DIAGNOSIS — Z87891 Personal history of nicotine dependence: Secondary | ICD-10-CM | POA: Diagnosis not present

## 2015-12-10 DIAGNOSIS — I4581 Long QT syndrome: Secondary | ICD-10-CM | POA: Diagnosis not present

## 2015-12-10 DIAGNOSIS — I509 Heart failure, unspecified: Secondary | ICD-10-CM | POA: Diagnosis not present

## 2015-12-10 DIAGNOSIS — I120 Hypertensive chronic kidney disease with stage 5 chronic kidney disease or end stage renal disease: Secondary | ICD-10-CM | POA: Diagnosis not present

## 2015-12-10 DIAGNOSIS — R918 Other nonspecific abnormal finding of lung field: Secondary | ICD-10-CM | POA: Diagnosis not present

## 2015-12-10 DIAGNOSIS — K219 Gastro-esophageal reflux disease without esophagitis: Secondary | ICD-10-CM | POA: Diagnosis not present

## 2015-12-10 DIAGNOSIS — R072 Precordial pain: Secondary | ICD-10-CM | POA: Diagnosis not present

## 2015-12-10 DIAGNOSIS — N186 End stage renal disease: Secondary | ICD-10-CM | POA: Diagnosis not present

## 2015-12-10 DIAGNOSIS — R079 Chest pain, unspecified: Secondary | ICD-10-CM | POA: Diagnosis not present

## 2015-12-10 DIAGNOSIS — Z992 Dependence on renal dialysis: Secondary | ICD-10-CM | POA: Diagnosis not present

## 2015-12-10 DIAGNOSIS — Z940 Kidney transplant status: Secondary | ICD-10-CM | POA: Diagnosis not present

## 2015-12-12 DIAGNOSIS — Z7901 Long term (current) use of anticoagulants: Secondary | ICD-10-CM | POA: Diagnosis not present

## 2015-12-12 DIAGNOSIS — N186 End stage renal disease: Secondary | ICD-10-CM | POA: Diagnosis not present

## 2015-12-12 DIAGNOSIS — E8779 Other fluid overload: Secondary | ICD-10-CM | POA: Diagnosis not present

## 2015-12-12 DIAGNOSIS — D631 Anemia in chronic kidney disease: Secondary | ICD-10-CM | POA: Diagnosis not present

## 2015-12-12 DIAGNOSIS — N2581 Secondary hyperparathyroidism of renal origin: Secondary | ICD-10-CM | POA: Diagnosis not present

## 2015-12-12 DIAGNOSIS — Z418 Encounter for other procedures for purposes other than remedying health state: Secondary | ICD-10-CM | POA: Diagnosis not present

## 2015-12-13 DIAGNOSIS — Z86718 Personal history of other venous thrombosis and embolism: Secondary | ICD-10-CM | POA: Diagnosis not present

## 2015-12-13 DIAGNOSIS — Z885 Allergy status to narcotic agent status: Secondary | ICD-10-CM | POA: Diagnosis not present

## 2015-12-13 DIAGNOSIS — J8489 Other specified interstitial pulmonary diseases: Secondary | ICD-10-CM | POA: Diagnosis not present

## 2015-12-13 DIAGNOSIS — Z86711 Personal history of pulmonary embolism: Secondary | ICD-10-CM | POA: Diagnosis not present

## 2015-12-13 DIAGNOSIS — Z79899 Other long term (current) drug therapy: Secondary | ICD-10-CM | POA: Diagnosis not present

## 2015-12-13 DIAGNOSIS — I509 Heart failure, unspecified: Secondary | ICD-10-CM | POA: Diagnosis not present

## 2015-12-13 DIAGNOSIS — R1013 Epigastric pain: Secondary | ICD-10-CM | POA: Diagnosis not present

## 2015-12-13 DIAGNOSIS — N186 End stage renal disease: Secondary | ICD-10-CM | POA: Diagnosis not present

## 2015-12-13 DIAGNOSIS — Z940 Kidney transplant status: Secondary | ICD-10-CM | POA: Diagnosis not present

## 2015-12-13 DIAGNOSIS — J810 Acute pulmonary edema: Secondary | ICD-10-CM | POA: Diagnosis not present

## 2015-12-13 DIAGNOSIS — R748 Abnormal levels of other serum enzymes: Secondary | ICD-10-CM | POA: Diagnosis not present

## 2015-12-13 DIAGNOSIS — Z7901 Long term (current) use of anticoagulants: Secondary | ICD-10-CM | POA: Diagnosis not present

## 2015-12-13 DIAGNOSIS — Z91048 Other nonmedicinal substance allergy status: Secondary | ICD-10-CM | POA: Diagnosis not present

## 2015-12-13 DIAGNOSIS — R591 Generalized enlarged lymph nodes: Secondary | ICD-10-CM | POA: Diagnosis not present

## 2015-12-13 DIAGNOSIS — Z992 Dependence on renal dialysis: Secondary | ICD-10-CM | POA: Diagnosis not present

## 2015-12-13 DIAGNOSIS — R0989 Other specified symptoms and signs involving the circulatory and respiratory systems: Secondary | ICD-10-CM | POA: Diagnosis not present

## 2015-12-13 DIAGNOSIS — I4581 Long QT syndrome: Secondary | ICD-10-CM | POA: Diagnosis not present

## 2015-12-13 DIAGNOSIS — D631 Anemia in chronic kidney disease: Secondary | ICD-10-CM | POA: Diagnosis not present

## 2015-12-13 DIAGNOSIS — Z886 Allergy status to analgesic agent status: Secondary | ICD-10-CM | POA: Diagnosis not present

## 2015-12-13 DIAGNOSIS — Z856 Personal history of leukemia: Secondary | ICD-10-CM | POA: Diagnosis not present

## 2015-12-13 DIAGNOSIS — Z7952 Long term (current) use of systemic steroids: Secondary | ICD-10-CM | POA: Diagnosis not present

## 2015-12-13 DIAGNOSIS — D649 Anemia, unspecified: Secondary | ICD-10-CM | POA: Diagnosis not present

## 2015-12-13 DIAGNOSIS — I120 Hypertensive chronic kidney disease with stage 5 chronic kidney disease or end stage renal disease: Secondary | ICD-10-CM | POA: Diagnosis not present

## 2015-12-13 DIAGNOSIS — Z9889 Other specified postprocedural states: Secondary | ICD-10-CM | POA: Diagnosis not present

## 2015-12-13 DIAGNOSIS — R0789 Other chest pain: Secondary | ICD-10-CM | POA: Diagnosis not present

## 2015-12-13 DIAGNOSIS — K219 Gastro-esophageal reflux disease without esophagitis: Secondary | ICD-10-CM | POA: Diagnosis not present

## 2015-12-13 DIAGNOSIS — R079 Chest pain, unspecified: Secondary | ICD-10-CM | POA: Diagnosis not present

## 2015-12-13 DIAGNOSIS — J90 Pleural effusion, not elsewhere classified: Secondary | ICD-10-CM | POA: Diagnosis not present

## 2015-12-13 DIAGNOSIS — I517 Cardiomegaly: Secondary | ICD-10-CM | POA: Diagnosis not present

## 2015-12-13 DIAGNOSIS — I132 Hypertensive heart and chronic kidney disease with heart failure and with stage 5 chronic kidney disease, or end stage renal disease: Secondary | ICD-10-CM | POA: Diagnosis not present

## 2015-12-13 DIAGNOSIS — Z888 Allergy status to other drugs, medicaments and biological substances status: Secondary | ICD-10-CM | POA: Diagnosis not present

## 2015-12-13 DIAGNOSIS — Z87891 Personal history of nicotine dependence: Secondary | ICD-10-CM | POA: Diagnosis not present

## 2015-12-14 DIAGNOSIS — Z79899 Other long term (current) drug therapy: Secondary | ICD-10-CM | POA: Diagnosis not present

## 2015-12-14 DIAGNOSIS — Z91048 Other nonmedicinal substance allergy status: Secondary | ICD-10-CM | POA: Diagnosis not present

## 2015-12-14 DIAGNOSIS — Z992 Dependence on renal dialysis: Secondary | ICD-10-CM | POA: Diagnosis not present

## 2015-12-14 DIAGNOSIS — Z7952 Long term (current) use of systemic steroids: Secondary | ICD-10-CM | POA: Diagnosis not present

## 2015-12-14 DIAGNOSIS — I4581 Long QT syndrome: Secondary | ICD-10-CM | POA: Diagnosis not present

## 2015-12-14 DIAGNOSIS — I120 Hypertensive chronic kidney disease with stage 5 chronic kidney disease or end stage renal disease: Secondary | ICD-10-CM | POA: Diagnosis not present

## 2015-12-14 DIAGNOSIS — J90 Pleural effusion, not elsewhere classified: Secondary | ICD-10-CM | POA: Diagnosis not present

## 2015-12-14 DIAGNOSIS — Z888 Allergy status to other drugs, medicaments and biological substances status: Secondary | ICD-10-CM | POA: Diagnosis not present

## 2015-12-14 DIAGNOSIS — D649 Anemia, unspecified: Secondary | ICD-10-CM | POA: Diagnosis not present

## 2015-12-14 DIAGNOSIS — N186 End stage renal disease: Secondary | ICD-10-CM | POA: Diagnosis not present

## 2015-12-14 DIAGNOSIS — Z7901 Long term (current) use of anticoagulants: Secondary | ICD-10-CM | POA: Diagnosis not present

## 2015-12-14 DIAGNOSIS — I517 Cardiomegaly: Secondary | ICD-10-CM | POA: Diagnosis not present

## 2015-12-14 DIAGNOSIS — R591 Generalized enlarged lymph nodes: Secondary | ICD-10-CM | POA: Diagnosis not present

## 2015-12-16 ENCOUNTER — Encounter (HOSPITAL_COMMUNITY): Payer: Self-pay | Admitting: Emergency Medicine

## 2015-12-16 ENCOUNTER — Emergency Department (HOSPITAL_COMMUNITY): Payer: Medicare Other

## 2015-12-16 ENCOUNTER — Encounter (HOSPITAL_COMMUNITY): Payer: Self-pay | Admitting: *Deleted

## 2015-12-16 ENCOUNTER — Non-Acute Institutional Stay (HOSPITAL_COMMUNITY)
Admission: EM | Admit: 2015-12-16 | Discharge: 2015-12-16 | Disposition: A | Discharge status: Home / Self Care | Payer: Medicare Other | Source: Home / Self Care | Attending: Emergency Medicine | Admitting: Emergency Medicine

## 2015-12-16 DIAGNOSIS — R1084 Generalized abdominal pain: Secondary | ICD-10-CM | POA: Diagnosis not present

## 2015-12-16 DIAGNOSIS — Z79891 Long term (current) use of opiate analgesic: Secondary | ICD-10-CM | POA: Insufficient documentation

## 2015-12-16 DIAGNOSIS — Z87891 Personal history of nicotine dependence: Secondary | ICD-10-CM

## 2015-12-16 DIAGNOSIS — I132 Hypertensive heart and chronic kidney disease with heart failure and with stage 5 chronic kidney disease, or end stage renal disease: Secondary | ICD-10-CM | POA: Insufficient documentation

## 2015-12-16 DIAGNOSIS — R112 Nausea with vomiting, unspecified: Secondary | ICD-10-CM | POA: Insufficient documentation

## 2015-12-16 DIAGNOSIS — I509 Heart failure, unspecified: Secondary | ICD-10-CM | POA: Insufficient documentation

## 2015-12-16 DIAGNOSIS — Z7901 Long term (current) use of anticoagulants: Secondary | ICD-10-CM | POA: Insufficient documentation

## 2015-12-16 DIAGNOSIS — Z940 Kidney transplant status: Secondary | ICD-10-CM | POA: Insufficient documentation

## 2015-12-16 DIAGNOSIS — Z7952 Long term (current) use of systemic steroids: Secondary | ICD-10-CM

## 2015-12-16 DIAGNOSIS — E875 Hyperkalemia: Secondary | ICD-10-CM | POA: Insufficient documentation

## 2015-12-16 DIAGNOSIS — R0602 Shortness of breath: Secondary | ICD-10-CM | POA: Diagnosis not present

## 2015-12-16 DIAGNOSIS — N186 End stage renal disease: Secondary | ICD-10-CM

## 2015-12-16 DIAGNOSIS — D649 Anemia, unspecified: Secondary | ICD-10-CM

## 2015-12-16 DIAGNOSIS — Z791 Long term (current) use of non-steroidal anti-inflammatories (NSAID): Secondary | ICD-10-CM | POA: Diagnosis not present

## 2015-12-16 DIAGNOSIS — Z79899 Other long term (current) drug therapy: Secondary | ICD-10-CM | POA: Insufficient documentation

## 2015-12-16 DIAGNOSIS — Z992 Dependence on renal dialysis: Secondary | ICD-10-CM | POA: Insufficient documentation

## 2015-12-16 DIAGNOSIS — F419 Anxiety disorder, unspecified: Secondary | ICD-10-CM | POA: Insufficient documentation

## 2015-12-16 DIAGNOSIS — E1122 Type 2 diabetes mellitus with diabetic chronic kidney disease: Secondary | ICD-10-CM | POA: Insufficient documentation

## 2015-12-16 DIAGNOSIS — R1030 Lower abdominal pain, unspecified: Secondary | ICD-10-CM | POA: Diagnosis not present

## 2015-12-16 DIAGNOSIS — R109 Unspecified abdominal pain: Secondary | ICD-10-CM | POA: Diagnosis present

## 2015-12-16 LAB — CBC WITH DIFFERENTIAL/PLATELET
Basophils Absolute: 0 10*3/uL (ref 0.0–0.1)
Basophils Relative: 0 %
Eosinophils Absolute: 0.2 10*3/uL (ref 0.0–0.7)
Eosinophils Relative: 5 %
HCT: 24.4 % — ABNORMAL LOW (ref 39.0–52.0)
HEMOGLOBIN: 7.7 g/dL — AB (ref 13.0–17.0)
LYMPHS ABS: 1 10*3/uL (ref 0.7–4.0)
LYMPHS PCT: 19 %
MCH: 26.5 pg (ref 26.0–34.0)
MCHC: 31.6 g/dL (ref 30.0–36.0)
MCV: 83.8 fL (ref 78.0–100.0)
Monocytes Absolute: 0.6 10*3/uL (ref 0.1–1.0)
Monocytes Relative: 11 %
NEUTROS ABS: 3.4 10*3/uL (ref 1.7–7.7)
NEUTROS PCT: 65 %
Platelets: 212 10*3/uL (ref 150–400)
RBC: 2.91 MIL/uL — AB (ref 4.22–5.81)
RDW: 15.9 % — ABNORMAL HIGH (ref 11.5–15.5)
WBC: 5.2 10*3/uL (ref 4.0–10.5)

## 2015-12-16 LAB — COMPREHENSIVE METABOLIC PANEL
ALT: 15 U/L — ABNORMAL LOW (ref 17–63)
ANION GAP: 21 — AB (ref 5–15)
AST: 66 U/L — AB (ref 15–41)
Albumin: 3.1 g/dL — ABNORMAL LOW (ref 3.5–5.0)
Alkaline Phosphatase: 121 U/L (ref 38–126)
BUN: 78 mg/dL — ABNORMAL HIGH (ref 6–20)
CHLORIDE: 93 mmol/L — AB (ref 101–111)
CO2: 17 mmol/L — ABNORMAL LOW (ref 22–32)
Calcium: 8 mg/dL — ABNORMAL LOW (ref 8.9–10.3)
Creatinine, Ser: 17.05 mg/dL — ABNORMAL HIGH (ref 0.61–1.24)
GFR, EST AFRICAN AMERICAN: 3 mL/min — AB (ref >60)
GFR, EST NON AFRICAN AMERICAN: 3 mL/min — AB (ref >60)
Glucose, Bld: 59 mg/dL — ABNORMAL LOW (ref 65–99)
POTASSIUM: 7.3 mmol/L — AB (ref 3.5–5.1)
Sodium: 131 mmol/L — ABNORMAL LOW (ref 135–145)
Total Bilirubin: 1.4 mg/dL — ABNORMAL HIGH (ref 0.3–1.2)
Total Protein: 7.9 g/dL (ref 6.5–8.1)

## 2015-12-16 LAB — CBG MONITORING, ED
Glucose-Capillary: 47 mg/dL — ABNORMAL LOW (ref 65–99)
Glucose-Capillary: 53 mg/dL — ABNORMAL LOW (ref 65–99)

## 2015-12-16 LAB — PROTIME-INR
INR: 1.41 (ref 0.00–1.49)
PROTHROMBIN TIME: 17.3 s — AB (ref 11.6–15.2)

## 2015-12-16 LAB — LIPASE, BLOOD: Lipase: 20 U/L (ref 11–51)

## 2015-12-16 MED ORDER — ALTEPLASE 2 MG IJ SOLR: 2.0000 mg | Freq: Once | INTRAMUSCULAR | Status: DC | PRN

## 2015-12-16 MED ORDER — LIDOCAINE-PRILOCAINE 2.5-2.5 % EX CREA
1.0000 "application " | TOPICAL_CREAM | CUTANEOUS | Status: DC | PRN
  Filled 2015-12-16: qty 5

## 2015-12-16 MED ORDER — LIDOCAINE HCL (PF) 1 % IJ SOLN: 5.0000 mL | INTRAMUSCULAR | Status: DC | PRN

## 2015-12-16 MED ORDER — HEPARIN SODIUM (PORCINE) 1000 UNIT/ML DIALYSIS
40.0000 [IU]/kg | INTRAMUSCULAR | Status: DC | PRN
  Filled 2015-12-16: qty 3

## 2015-12-16 MED ORDER — SODIUM CHLORIDE 0.9 % IV SOLN: 100.0000 mL | INTRAVENOUS | Status: DC | PRN

## 2015-12-16 MED ORDER — GLUCOSE 40 % PO GEL
1.0000 | Freq: Once | ORAL | Status: AC
  Administered 2015-12-16: 37.5 g via ORAL
  Filled 2015-12-16: qty 1

## 2015-12-16 MED ORDER — METOCLOPRAMIDE HCL 10 MG PO TABS
5.0000 mg | ORAL_TABLET | Freq: Once | ORAL | Status: AC
  Administered 2015-12-16: 5 mg via ORAL
  Filled 2015-12-16: qty 1

## 2015-12-16 MED ORDER — CLONIDINE HCL 0.1 MG PO TABS
0.1000 mg | ORAL_TABLET | Freq: Once | ORAL | Status: DC
  Administered 2015-12-16: 0.1 mg via ORAL
  Filled 2015-12-16 (×2): qty 1

## 2015-12-16 MED ORDER — HEPARIN SODIUM (PORCINE) 1000 UNIT/ML DIALYSIS
1000.0000 [IU] | INTRAMUSCULAR | Status: DC | PRN
  Filled 2015-12-16: qty 1

## 2015-12-16 MED ORDER — PENTAFLUOROPROP-TETRAFLUOROETH EX AERO
1.0000 "application " | INHALATION_SPRAY | CUTANEOUS | Status: DC | PRN
  Filled 2015-12-16: qty 30

## 2015-12-16 NOTE — ED Provider Notes (Signed)
CSN: [id number]     Arrival date & time 12/16/15  1255 History   First MD Initiated Contact with Patient 12/16/15 1351     Chief Complaint  Patient presents with  . Shortness of Breath    Patient is a 50 y.o. male presenting with shortness of breath. The history is provided by the patient. No language interpreter was used.  Shortness of Breath  KRAYTON WORTLEY is a 50 y.o. male who presents to the Emergency Department complaining of hyperkalemia.  Mr. Burandt presents today saying that his potassium is high and he was referred from dialysis for evaluation. He has a history of end-stage renal disease and receives dialysis Tuesday, Thursday, Saturday. His last dialysis session was Tuesday. When he presented to dialysis today he was told that his potassium was high and he was referred to the emergency department. He is unclear on whether or not labs were drawn at dialysis. He also endorses abdominal pain, back pain, vomiting that up and ongoing for the last several days. He has pain with deep breaths and he is coughing up white sputum. He denies any fevers.  Past Medical History  Diagnosis Date  . Hypertension   . Depression   . Complication of anesthesia     itching, sore throat  . Shortness of breath   . Anxiety   . CHF (congestive heart failure) ([place])   . Anemia   . Dialysis patient ([place])   . Type II diabetes mellitus (HCC)     No history per patient, but remains under history as A1c would not be accurate given on dialysis  . ESRD (end stage renal disease) ([place])     due to HTN per patient, followed at [facility], s/p failed kidney transplant - dialysis Tue, Th, Sat  . Renal insufficiency    Past Surgical History  Procedure Laterality Date  . Kidney receipient  2006    failed and started HD in March 2014  . Capd insertion    . Capd removal    . Left heart catheterization with coronary angiogram N/A 09/02/2014    Procedure: LEFT HEART CATHETERIZATION WITH CORONARY ANGIOGRAM;  Surgeon: [name], MD;  Location: [facility] CATH LAB;  Service: Cardiovascular;  Laterality: N/A;  . Inguinal hernia repair Right 02/14/2015    Procedure: REPAIR INCARCERATED RIGHT INGUINAL HERNIA;  Surgeon: [name], MD;  Location: [place];  Service: General;  Laterality: Right;  . Insertion of dialysis catheter Right 09/23/2015    Procedure: exchange of Right internal Dialysis Catheter.;  Surgeon: [name], MD;  Location: [facility] OR;  Service: Vascular;  Laterality: Right;   Family History  Problem Relation Age of Onset  . Hypertension Other    Social History  Substance Use Topics  . Smoking status: Former Smoker -- 0.00 packs/day for 1 years    Types: Cigarettes  . Smokeless tobacco: Never Used     Comment: quit Jan 2014  . Alcohol Use: No    Review of Systems  Respiratory: Positive for shortness of breath.   All other systems reviewed and are negative.     Allergies  Butalbital-apap-caffeine; Ferrlecit; Minoxidil; and Darvocet  Home Medications   Prior to Admission medications   Medication Sig Start Date End Date Taking? Authorizing Provider  ALPRAZolam [name]) 0.5 MG tablet Take 1 tablet (0.5 mg total) by mouth 2 (two) times daily as needed for anxiety. 11/04/15   [name], MD  amLODipine (NORVASC) 10 MG tablet Take 10  mg by mouth daily.    Historical Provider, MD  atorvastatin (LIPITOR) 40 MG tablet Take 1 tablet (40 mg total) by mouth daily at 6 PM. 09/02/14   Barton Dubois, MD  carvedilol (COREG) 12.5 MG tablet Take 1 tablet (12.5 mg total) by mouth 2 (two) times daily with a meal. 11/04/15   [name], MD  cinacalcet (SENSIPAR) 30 MG tablet Take 30 mg by mouth daily.    Historical Provider, MD  cyclobenzaprine (FLEXERIL) 10 MG tablet Take 1 tablet (10 mg total) by mouth 3 (three) times daily as needed for muscle spasms. 11/04/15   [name], MD  docusate sodium (COLACE) 100 MG capsule Take 1 capsule (100 mg total) by mouth 2 (two) times daily. 11/19/14   [name], MD   doxercalciferol (HECTOROL) 4 MCG/2ML injection Inject 1.25 mLs (2.5 mcg total) into the vein Every Tuesday,Thursday,and Saturday with dialysis. 10/10/14   [name], DO  gabapentin (NEURONTIN) 100 MG capsule Take 100 mg by mouth 3 (three) times daily. 06/14/15 10/30/15  Historical Provider, MD  gabapentin (NEURONTIN) 100 MG capsule Take 1 capsule (100 mg total) by mouth 3 (three) times daily. 11/04/15 03/21/16  [name], MD  hydrALAZINE (APRESOLINE) 50 MG tablet Take 2 tablets (100 mg total) by mouth every 8 (eight) hours. 09/11/15   [name], MD  hydrocerin (EUCERIN) CREA Apply 1 application topically 2 (two) times daily. 02/04/15   [name], MD  hydrOXYzine (ATARAX/VISTARIL) 10 MG tablet Take 1 tablet (10 mg total) by mouth 3 (three) times daily as needed for itching. 09/27/15   [name], MD  hyoscyamine (LEVSIN/SL) 0.125 MG SL tablet Place 1 tablet (0.125 mg total) under the tongue every 4 (four) hours as needed. 11/17/15   April Palumbo, MD  ibuprofen (ADVIL,MOTRIN) 600 MG tablet Take 1 tablet (600 mg total) by mouth every 8 (eight) hours as needed. 10/06/15   [name], MD  isosorbide mononitrate (IMDUR) 30 MG 24 hr tablet Take 3 tablets (90 mg total) by mouth daily. 09/11/15   [name], MD  levalbuterol [facility] HFA) 45 MCG/ACT inhaler Inhale 2 puffs into the lungs every 6 (six) hours as needed for wheezing or shortness of breath. 11/19/14   [name], MD  lisinopril (PRINIVIL,ZESTRIL) 20 MG tablet Take 1 tablet (20 mg total) by mouth daily. 02/04/15   [name], MD  multivitamin (RENA-VIT) TABS tablet Take 1 tablet by mouth at bedtime. 02/04/15   [name], MD  omeprazole (PRILOSEC) 20 MG capsule Take 20 mg by mouth daily. 07/01/13   Historical Provider, MD  ondansetron (ZOFRAN ODT) 4 MG disintegrating tablet Take 1 tablet (4 mg total) by mouth every 8 (eight) hours as needed for nausea or vomiting. 11/13/15   Carlisle Cater, PA-C  ondansetron (ZOFRAN  ODT) 8 MG disintegrating tablet 27m ODT q8 hours prn nausea 11/17/15   April Palumbo, MD  ondansetron (ZOFRAN) 4 MG tablet Take 1 tablet (4 mg total) by mouth every 6 (six) hours. 10/29/15   COrpah Greek MD  oxyCODONE-acetaminophen (PERCOCET/ROXICET) 5-325 MG tablet Take 1-2 tablets by mouth every 6 (six) hours as needed for severe pain. 11/13/15   JCarlisle Cater PA-C  predniSONE (DELTASONE) 5 MG tablet Take 5 mg by mouth daily with breakfast.    Historical Provider, MD  sevelamer carbonate (RENVELA) 800 MG tablet Take 800-1,600 mg by mouth 3 (three) times daily with meals. 2 tabs three times daily  with meals, and 1 tablet with snacks    Historical Provider, MD  traMADol (ULTRAM) 50 MG tablet Take 1 tablet (50 mg total) by mouth every 6 (six) hours as needed for moderate pain. 11/04/15   [name], MD  warfarin (COUMADIN) 6 MG tablet Take 1.5 tablets (9 mg total) by mouth daily at 6 PM. Take daily until adjusted per MD 09/12/15   [name], MD   BP 189/119 mmHg  Pulse 59  Resp 18  SpO2 94% Physical Exam  Constitutional: He is oriented to person, place, and time. He appears well-developed.  Chronically ill-appearing  HENT:  Head: Normocephalic and atraumatic.  Cardiovascular: Normal rate and regular rhythm.   No murmur heard. Pulmonary/Chest: Effort normal. No respiratory distress.  Vascular catheter in right anterior chest wall. Occasional end expiratory wheezes in the bases.  Abdominal: Soft. There is no rebound and no guarding.  Moderate upper abdominal tenderness without guarding or rebound  Musculoskeletal: He exhibits no tenderness.  1+ edema bilateral lower extremities  Neurological: He is alert and oriented to person, place, and time.  Skin: Skin is warm and dry.  Psychiatric: He has a normal mood and affect. His behavior is normal.  Nursing note and vitals reviewed.   ED Course  Procedures (including critical care time) Labs Review Labs Reviewed   COMPREHENSIVE METABOLIC PANEL - Abnormal; Notable for the following:    Sodium 131 (*)    Potassium 7.3 (*)    Chloride 93 (*)    CO2 17 (*)    Glucose, Bld 59 (*)    BUN 78 (*)    Creatinine, Ser 17.05 (*)    Calcium 8.0 (*)    Albumin 3.1 (*)    AST 66 (*)    ALT 15 (*)    Total Bilirubin 1.4 (*)    GFR calc non Af Amer 3 (*)    GFR calc Af Amer 3 (*)    Anion gap 21 (*)    All other components within normal limits  CBC WITH DIFFERENTIAL/PLATELET - Abnormal; Notable for the following:    RBC 2.91 (*)    Hemoglobin 7.7 (*)    HCT 24.4 (*)    RDW 15.9 (*)    All other components within normal limits  PROTIME-INR - Abnormal; Notable for the following:    Prothrombin Time 17.3 (*)    All other components within normal limits  LIPASE, BLOOD  CBG MONITORING, ED    Imaging Review Dg Chest Port 1 View  12/16/2015  CLINICAL DATA:  50 year old male with shortness breath and hypertension for the past 3 days. EXAM: PORTABLE CHEST 1 VIEW COMPARISON:  Chest x-ray 12/09/2015. FINDINGS: Right internal jugular PermCath with tip terminating at the superior cavoatrial junction. Lung volumes are normal. No consolidative airspace disease. No pleural effusions. No pneumothorax. No evidence of pulmonary edema. Mild cardiomegaly. The patient is rotated to the right on today's exam, resulting in distortion of the mediastinal contours and reduced diagnostic sensitivity and specificity for mediastinal pathology. IMPRESSION: 1. No radiographic evidence of acute cardiopulmonary disease. 2. Mild cardiomegaly. Electronically Signed   By: Vinnie Langton M.D.   On: 12/16/2015 14:44   I have personally reviewed and evaluated these images and lab results as part of my medical decision-making.   EKG Interpretation   Date/Time:  Saturday Dec 16 2015 13:01:56 EDT Ventricular Rate:  65 PR Interval:  176 QRS Duration: 98 QT Interval:  464 QTC Calculation: 482 R Axis:   -  10 Text Interpretation:  Normal  sinus rhythm Possible Left atrial enlargement  Left ventricular hypertrophy with repolarization abnormality Prolonged QT  Abnormal ECG Confirmed by Hazle Coca [phone]) on 12/16/2015 1:27:49 PM      MDM   Final diagnoses:  ESRD (end stage renal disease) on dialysis [facility])    Patient here for evaluation of needing dialysis, vomiting, epigastric pain. CMP demonstrates hyperkalemia but there is significant hemolysis. Patient eating in the department. CBC was stable anemia compared to priors. Reviewed multiple prior visits including visits to [facility] in [facility] in the last few days. He has had multiple recent workups for abdominal pain and cardiac disease. Plan to dialyze the patient today with outpatient follow-up.  Quintella Reichert, MD 12/16/15 [id number]

## 2015-12-16 NOTE — ED Notes (Signed)
Report given to dialysis.

## 2015-12-16 NOTE — ED Notes (Signed)
C/o sob states he was sent to ed for elevated K and High blood pressure, last dialysis was Tues. States he didn't feel well Thurs. So he didn't go to dialysis. Patient will not answer questions.

## 2015-12-16 NOTE — ED Notes (Signed)
Pt refusing to have blood redrawn. States he had blood drawn here today. Pt made aware that was before dialysis.

## 2015-12-16 NOTE — ED Notes (Signed)
Pt was seen here earlier today for abd pain and was sent to dialysis and discharge from there and pt came back here for abd pain and vomiting.

## 2015-12-17 ENCOUNTER — Emergency Department (HOSPITAL_COMMUNITY)
Admission: EM | Admit: 2015-12-17 | Discharge: 2015-12-17 | Disposition: A | Discharge status: Home / Self Care | Payer: Medicare Other | Attending: Emergency Medicine | Admitting: Emergency Medicine

## 2015-12-17 ENCOUNTER — Emergency Department (HOSPITAL_COMMUNITY): Payer: Medicare Other

## 2015-12-17 DIAGNOSIS — R112 Nausea with vomiting, unspecified: Secondary | ICD-10-CM

## 2015-12-17 DIAGNOSIS — R1084 Generalized abdominal pain: Secondary | ICD-10-CM | POA: Diagnosis not present

## 2015-12-17 DIAGNOSIS — R109 Unspecified abdominal pain: Secondary | ICD-10-CM

## 2015-12-17 LAB — COMPREHENSIVE METABOLIC PANEL
ALK PHOS: 106 U/L (ref 38–126)
ALT: 15 U/L — ABNORMAL LOW (ref 17–63)
ANION GAP: 13 (ref 5–15)
AST: 58 U/L — ABNORMAL HIGH (ref 15–41)
Albumin: 2.7 g/dL — ABNORMAL LOW (ref 3.5–5.0)
BILIRUBIN TOTAL: 0.6 mg/dL (ref 0.3–1.2)
BUN: 27 mg/dL — ABNORMAL HIGH (ref 6–20)
CALCIUM: 8.8 mg/dL — AB (ref 8.9–10.3)
CO2: 26 mmol/L (ref 22–32)
Chloride: 95 mmol/L — ABNORMAL LOW (ref 101–111)
Creatinine, Ser: 9.34 mg/dL — ABNORMAL HIGH (ref 0.61–1.24)
GFR calc non Af Amer: 6 mL/min — ABNORMAL LOW (ref >60)
GFR, EST AFRICAN AMERICAN: 7 mL/min — AB (ref >60)
Glucose, Bld: 83 mg/dL (ref 65–99)
POTASSIUM: 3.9 mmol/L (ref 3.5–5.1)
SODIUM: 134 mmol/L — AB (ref 135–145)
TOTAL PROTEIN: 7.7 g/dL (ref 6.5–8.1)

## 2015-12-17 LAB — CBC
HCT: 24.2 % — ABNORMAL LOW (ref 39.0–52.0)
HEMOGLOBIN: 7.7 g/dL — AB (ref 13.0–17.0)
MCH: 26.4 pg (ref 26.0–34.0)
MCHC: 31.8 g/dL (ref 30.0–36.0)
MCV: 82.9 fL (ref 78.0–100.0)
Platelets: 186 10*3/uL (ref 150–400)
RBC: 2.92 MIL/uL — AB (ref 4.22–5.81)
RDW: 15.8 % — ABNORMAL HIGH (ref 11.5–15.5)
WBC: 4.5 10*3/uL (ref 4.0–10.5)

## 2015-12-17 LAB — LIPASE, BLOOD: Lipase: 32 U/L (ref 11–51)

## 2015-12-17 MED ORDER — HYDROMORPHONE HCL 1 MG/ML IJ SOLN
1.0000 mg | Freq: Once | INTRAMUSCULAR | Status: AC
  Administered 2015-12-17: 1 mg via INTRAVENOUS
  Filled 2015-12-17: qty 1

## 2015-12-17 MED ORDER — ONDANSETRON HCL 4 MG PO TABS: 4.0000 mg | ORAL_TABLET | Freq: Four times a day (QID) | ORAL | Status: DC | PRN

## 2015-12-17 MED ORDER — METOCLOPRAMIDE HCL 10 MG PO TABS: 10.0000 mg | ORAL_TABLET | Freq: Four times a day (QID) | ORAL | Status: DC | PRN

## 2015-12-17 MED ORDER — METOCLOPRAMIDE HCL 5 MG/ML IJ SOLN
10.0000 mg | Freq: Once | INTRAMUSCULAR | Status: AC
  Administered 2015-12-17: 10 mg via INTRAVENOUS
  Filled 2015-12-17: qty 2

## 2015-12-17 MED ORDER — ONDANSETRON HCL 4 MG/2ML IJ SOLN
4.0000 mg | Freq: Once | INTRAMUSCULAR | Status: AC
  Administered 2015-12-17: 4 mg via INTRAVENOUS
  Filled 2015-12-17: qty 2

## 2015-12-17 NOTE — ED Provider Notes (Signed)
CSN: [id number]     Arrival date & time 12/16/15  2234 History   First MD Initiated Contact with Patient 12/17/15 [phone]     Chief Complaint  Patient presents with  . Abdominal Pain     (Consider location/radiation/quality/duration/timing/severity/associated sxs/prior Treatment) HPI Comments: Patient presents to the emergency department for evaluation of abdominal pain. Patient was seen earlier and identified as being volume overloaded and hyperkalemic, requiring urgent dialysis. Patient went to dialysis but continues to have abdominal pain with nausea and vomiting. He reports that he is prescribed Percocet for his pain, but has been unable to hold the Percocet down.  Patient is a 50 y.o. male presenting with abdominal pain.  Abdominal Pain Associated symptoms: nausea and vomiting     Past Medical History  Diagnosis Date  . Hypertension   . Depression   . Complication of anesthesia     itching, sore throat  . Shortness of breath   . Anxiety   . CHF (congestive heart failure) ([place])   . Anemia   . Dialysis patient ([place])   . Type II diabetes mellitus (HCC)     No history per patient, but remains under history as A1c would not be accurate given on dialysis  . ESRD (end stage renal disease) ([place])     due to HTN per patient, followed at [facility], s/p failed kidney transplant - dialysis Tue, Th, Sat  . Renal insufficiency    Past Surgical History  Procedure Laterality Date  . Kidney receipient  2006    failed and started HD in March 2014  . Capd insertion    . Capd removal    . Left heart catheterization with coronary angiogram N/A 09/02/2014    Procedure: LEFT HEART CATHETERIZATION WITH CORONARY ANGIOGRAM;  Surgeon: [name], MD;  Location: [facility] CATH LAB;  Service: Cardiovascular;  Laterality: N/A;  . Inguinal hernia repair Right 02/14/2015    Procedure: REPAIR INCARCERATED RIGHT INGUINAL HERNIA;  Surgeon: [name], MD;  Location: [facility];  Service: General;  Laterality: Right;   . Insertion of dialysis catheter Right 09/23/2015    Procedure: exchange of Right internal Dialysis Catheter.;  Surgeon: [name], MD;  Location: [facility] OR;  Service: Vascular;  Laterality: Right;   Family History  Problem Relation Age of Onset  . Hypertension Other    Social History  Substance Use Topics  . Smoking status: Former Smoker -- 0.00 packs/day for 1 years    Types: Cigarettes  . Smokeless tobacco: Never Used     Comment: quit Jan 2014  . Alcohol Use: No    Review of Systems  Gastrointestinal: Positive for nausea, vomiting and abdominal pain.  All other systems reviewed and are negative.     Allergies  Butalbital-apap-caffeine; Ferrlecit; Minoxidil; and Darvocet  Home Medications   Prior to Admission medications   Medication Sig Start Date End Date Taking? Authorizing Provider  ALPRAZolam [name]) 0.5 MG tablet Take 1 tablet (0.5 mg total) by mouth 2 (two) times daily as needed for anxiety. 11/04/15  Yes [name], MD  amLODipine (NORVASC) 10 MG tablet Take 10 mg by mouth daily.   Yes Historical Provider, MD  atorvastatin (LIPITOR) 40 MG tablet Take 1 tablet (40 mg total) by mouth daily at 6 PM. 09/02/14  Yes Barton Dubois, MD  carvedilol (COREG) 12.5 MG tablet Take 1 tablet (12.5 mg total) by mouth 2 (two) times daily with a meal. 11/04/15  Yes [name], MD  cinacalcet (  SENSIPAR) 30 MG tablet Take 30 mg by mouth daily.   Yes Historical Provider, MD  cyclobenzaprine (FLEXERIL) 10 MG tablet Take 1 tablet (10 mg total) by mouth 3 (three) times daily as needed for muscle spasms. 11/04/15  Yes [name], MD  docusate sodium (COLACE) 100 MG capsule Take 1 capsule (100 mg total) by mouth 2 (two) times daily. 11/19/14  Yes [name], MD  doxercalciferol (HECTOROL) 4 MCG/2ML injection Inject 1.25 mLs (2.5 mcg total) into the vein Every Tuesday,Thursday,and Saturday with dialysis. 10/10/14  Yes [name], DO  gabapentin (NEURONTIN) 100 MG capsule Take 1  capsule (100 mg total) by mouth 3 (three) times daily. 11/04/15 03/21/16 Yes [name], MD  hydrALAZINE (APRESOLINE) 50 MG tablet Take 2 tablets (100 mg total) by mouth every 8 (eight) hours. 09/11/15  Yes [name], MD  hydrocerin (EUCERIN) CREA Apply 1 application topically 2 (two) times daily. 02/04/15  Yes [name], MD  hydrOXYzine (ATARAX/VISTARIL) 10 MG tablet Take 1 tablet (10 mg total) by mouth 3 (three) times daily as needed for itching. 09/27/15  Yes [name], MD  hyoscyamine (LEVSIN/SL) 0.125 MG SL tablet Place 1 tablet (0.125 mg total) under the tongue every 4 (four) hours as needed. Patient taking differently: Place 0.125 mg under the tongue every 4 (four) hours as needed for cramping.  11/17/15  Yes April Palumbo, MD  ibuprofen (ADVIL,MOTRIN) 600 MG tablet Take 1 tablet (600 mg total) by mouth every 8 (eight) hours as needed. Patient taking differently: Take 600 mg by mouth every 8 (eight) hours as needed for moderate pain.  10/06/15  Yes [name], MD  isosorbide mononitrate (IMDUR) 30 MG 24 hr tablet Take 3 tablets (90 mg total) by mouth daily. 09/11/15  Yes [name], MD  levalbuterol [facility] HFA) 45 MCG/ACT inhaler Inhale 2 puffs into the lungs every 6 (six) hours as needed for wheezing or shortness of breath. 11/19/14  Yes [name], MD  lisinopril (PRINIVIL,ZESTRIL) 20 MG tablet Take 1 tablet (20 mg total) by mouth daily. 02/04/15  Yes [name], MD  multivitamin (RENA-VIT) TABS tablet Take 1 tablet by mouth at bedtime. 02/04/15  Yes [name], MD  omeprazole (PRILOSEC) 20 MG capsule Take 20 mg by mouth daily. 07/01/13  Yes Historical Provider, MD  ondansetron (ZOFRAN ODT) 4 MG disintegrating tablet Take 1 tablet (4 mg total) by mouth every 8 (eight) hours as needed for nausea or vomiting. 11/13/15  Yes Carlisle Cater, PA-C  ondansetron (ZOFRAN ODT) 8 MG disintegrating tablet 18m ODT q8 hours prn nausea 11/17/15  Yes April Palumbo, MD   ondansetron (ZOFRAN) 4 MG tablet Take 1 tablet (4 mg total) by mouth every 6 (six) hours. Patient taking differently: Take 4 mg by mouth every 6 (six) hours as needed for nausea or vomiting.  10/29/15  Yes COrpah Greek MD  oxyCODONE-acetaminophen (PERCOCET/ROXICET) 5-325 MG tablet Take 1-2 tablets by mouth every 6 (six) hours as needed for severe pain. 11/13/15  Yes JCarlisle Cater PA-C  predniSONE (DELTASONE) 5 MG tablet Take 5 mg by mouth daily with breakfast.   Yes Historical Provider, MD  sevelamer carbonate (RENVELA) 800 MG tablet Take 800-1,600 mg by mouth 3 (three) times daily with meals. 2 tabs three times daily with meals, and 1 tablet with snacks   Yes Historical Provider, MD  traMADol (ULTRAM) 50 MG tablet Take 1 tablet (50 mg total) by mouth every 6 (six)  hours as needed for moderate pain. 11/04/15  Yes [name], MD  warfarin (COUMADIN) 6 MG tablet Take 1.5 tablets (9 mg total) by mouth daily at 6 PM. Take daily until adjusted per MD 09/12/15  Yes [name], MD   BP 172/112 mmHg  Pulse 80  Temp(Src) 99.4 F (37.4 C) (Oral)  Resp 16  Ht 6' (1.829 m)  Wt 173 lb (78.472 kg)  BMI 23.46 kg/m2  SpO2 94% Physical Exam  Constitutional: He is oriented to person, place, and time. He appears well-developed and well-nourished. No distress.  HENT:  Head: Normocephalic and atraumatic.  Right Ear: Hearing normal.  Left Ear: Hearing normal.  Nose: Nose normal.  Mouth/Throat: Oropharynx is clear and moist and mucous membranes are normal.  Eyes: Conjunctivae and EOM are normal. Pupils are equal, round, and reactive to light.  Neck: Normal range of motion. Neck supple.  Cardiovascular: Regular rhythm, S1 normal and S2 normal.  Exam reveals no gallop and no friction rub.   No murmur heard. Pulmonary/Chest: Effort normal and breath sounds normal. No respiratory distress. He exhibits no tenderness.  Abdominal: Soft. Normal appearance and bowel sounds are normal. There is no  hepatosplenomegaly. There is tenderness. There is no rebound, no guarding, no tenderness at McBurney's point and negative Murphy's sign. No hernia.  Musculoskeletal: Normal range of motion.  Neurological: He is alert and oriented to person, place, and time. He has normal strength. No cranial nerve deficit or sensory deficit. Coordination normal. GCS eye subscore is 4. GCS verbal subscore is 5. GCS motor subscore is 6.  Skin: Skin is warm, dry and intact. No rash noted. No cyanosis.  Psychiatric: He has a normal mood and affect. His speech is normal and behavior is normal. Thought content normal.  Nursing note and vitals reviewed.   ED Course  Procedures (including critical care time) Labs Review Labs Reviewed  COMPREHENSIVE METABOLIC PANEL - Abnormal; Notable for the following:    Sodium 134 (*)    Chloride 95 (*)    BUN 27 (*)    Creatinine, Ser 9.34 (*)    Calcium 8.8 (*)    Albumin 2.7 (*)    AST 58 (*)    ALT 15 (*)    GFR calc non Af Amer 6 (*)    GFR calc Af Amer 7 (*)    All other components within normal limits  CBC - Abnormal; Notable for the following:    RBC 2.92 (*)    Hemoglobin 7.7 (*)    HCT 24.2 (*)    RDW 15.8 (*)    All other components within normal limits  LIPASE, BLOOD  CBG MONITORING, ED    Imaging Review Dg Chest Port 1 View  12/16/2015  CLINICAL DATA:  50 year old male with shortness breath and hypertension for the past 3 days. EXAM: PORTABLE CHEST 1 VIEW COMPARISON:  Chest x-ray 12/09/2015. FINDINGS: Right internal jugular PermCath with tip terminating at the superior cavoatrial junction. Lung volumes are normal. No consolidative airspace disease. No pleural effusions. No pneumothorax. No evidence of pulmonary edema. Mild cardiomegaly. The patient is rotated to the right on today's exam, resulting in distortion of the mediastinal contours and reduced diagnostic sensitivity and specificity for mediastinal pathology. IMPRESSION: 1. No radiographic evidence  of acute cardiopulmonary disease. 2. Mild cardiomegaly. Electronically Signed   By: Vinnie Langton M.D.   On: 12/16/2015 14:44   Dg Abd 2 Views  12/17/2015  CLINICAL DATA:  Generalized abdominal pain,  nausea, vomiting. EXAM: ABDOMEN - 2 VIEW COMPARISON:  November 17, 2015. FINDINGS: The bowel gas pattern is normal. There is no evidence of free air. Phleboliths are noted in the pelvis. IMPRESSION: There is no evidence of bowel obstruction or ileus. Electronically Signed   By: Marijo Conception, M.D.   On: 12/17/2015 07:08   I have personally reviewed and evaluated these images and lab results as part of my medical decision-making.   EKG Interpretation None      MDM   Final diagnoses:  Nausea and vomiting, vomiting of unspecified type  Abdominal pain  Flank pain    Patient presents to the emergency department for evaluation of nausea and vomiting with abdominal pain. This appears to be somewhat of a chronic problem for the patient. He has been seen for this in the past. Patient had recent hospitalization with similar symptoms. He was diagnosed with a renal tumor at that time and this is felt to be consistent with renal cell carcinoma. Patient reports that he is scheduled for surgery in 2 weeks. He has been prescribed Percocet for the ongoing pain issues that he has had secondary to the tumor, however, he has not been able to hold it down secondary to nausea and vomiting overnight.  Patient administered analgesia and antibiotics with improvement. File status is euvolemic having just finished hemodialysis. He does not require any IV fluids. Patient is experiencing chronic pain in the back that is secondary to the tumor. He will be provided antibiotics to use at home in addition to his Percocet and encouraged to follow-up with his surgery which is scheduled in 2 weeks.  Orpah Greek, MD 12/17/15 [phone]

## 2015-12-17 NOTE — Discharge Instructions (Signed)
Flank Pain Flank pain refers to pain that is located on the side of the body between the upper abdomen and the back. The pain may occur over a short period of time (acute) or may be long-term or reoccurring (chronic). It may be mild or severe. Flank pain can be caused by many things. CAUSES  Some of the more common causes of flank pain include:  Muscle strains.   Muscle spasms.   A disease of your spine (vertebral disk disease).   A lung infection (pneumonia).   Fluid around your lungs (pulmonary edema).   A kidney infection.   Kidney stones.   A very painful skin rash caused by the chickenpox virus (shingles).   Gallbladder disease.  [place] care will depend on the cause of your pain. In general,  Rest as directed by your caregiver.  Drink enough fluids to keep your urine clear or pale yellow.  Only take over-the-counter or prescription medicines as directed by your caregiver. Some medicines may help relieve the pain.  Tell your caregiver about any changes in your pain.  Follow up with your caregiver as directed. SEEK IMMEDIATE MEDICAL CARE IF:   Your pain is not controlled with medicine.   You have new or worsening symptoms.  Your pain increases.   You have abdominal pain.   You have shortness of breath.   You have persistent nausea or vomiting.   You have swelling in your abdomen.   You feel faint or pass out.   You have blood in your urine.  You have a fever or persistent symptoms for more than 2-3 days.  You have a fever and your symptoms suddenly get worse. MAKE SURE YOU:   Understand these instructions.  Will watch your condition.  Will get help right away if you are not doing well or get worse.   This information is not intended to replace advice given to you by your health care provider. Make sure you discuss any questions you have with your health care provider.   Document Released: 08/29/2005 Document  Revised: 04/01/2012 Document Reviewed: 02/20/2012 Elsevier Interactive Patient Education 2016 [employer]  Nausea and Vomiting Nausea means you feel sick to your stomach. Throwing up (vomiting) is a reflex where stomach contents come out of your mouth. HOME CARE   Take medicine as told by your doctor.  Do not force yourself to eat. However, you do need to drink fluids.  If you feel like eating, eat a normal diet as told by your doctor.  Eat rice, wheat, potatoes, bread, lean meats, yogurt, fruits, and vegetables.  Avoid high-fat foods.  Drink enough fluids to keep your pee (urine) clear or pale yellow.  Ask your doctor how to replace body fluid losses (rehydrate). Signs of body fluid loss (dehydration) include:  Feeling very thirsty.  Dry lips and mouth.  Feeling dizzy.  Dark pee.  Peeing less than normal.  Feeling confused.  Fast breathing or heart rate. GET HELP RIGHT AWAY IF:   You have blood in your throw up.  You have black or bloody poop (stool).  You have a bad headache or stiff neck.  You feel confused.  You have bad belly (abdominal) pain.  You have chest pain or trouble breathing.  You do not pee at least once every 8 hours.  You have cold, clammy skin.  You keep throwing up after 24 to 48 hours.  You have a fever. MAKE SURE YOU:   Understand  these instructions.  Will watch your condition.  Will get help right away if you are not doing well or get worse.   This information is not intended to replace advice given to you by your health care provider. Make sure you discuss any questions you have with your health care provider.   Document Released: 12/25/2007 Document Revised: 09/30/2011 Document Reviewed: 12/07/2010 Elsevier Interactive Patient Education [employer].

## 2015-12-19 DIAGNOSIS — Z7901 Long term (current) use of anticoagulants: Secondary | ICD-10-CM | POA: Diagnosis not present

## 2015-12-19 DIAGNOSIS — D631 Anemia in chronic kidney disease: Secondary | ICD-10-CM | POA: Diagnosis not present

## 2015-12-19 DIAGNOSIS — E8779 Other fluid overload: Secondary | ICD-10-CM | POA: Diagnosis not present

## 2015-12-19 DIAGNOSIS — N186 End stage renal disease: Secondary | ICD-10-CM | POA: Diagnosis not present

## 2015-12-19 DIAGNOSIS — Z418 Encounter for other procedures for purposes other than remedying health state: Secondary | ICD-10-CM | POA: Diagnosis not present

## 2015-12-19 DIAGNOSIS — N2581 Secondary hyperparathyroidism of renal origin: Secondary | ICD-10-CM | POA: Diagnosis not present

## 2015-12-19 LAB — CBG MONITORING, ED
GLUCOSE-CAPILLARY: 92 mg/dL (ref 65–99)
Glucose-Capillary: 83 mg/dL (ref 65–99)

## 2015-12-20 DIAGNOSIS — N186 End stage renal disease: Secondary | ICD-10-CM | POA: Diagnosis not present

## 2015-12-20 DIAGNOSIS — Z992 Dependence on renal dialysis: Secondary | ICD-10-CM | POA: Diagnosis not present

## 2015-12-21 DIAGNOSIS — N2581 Secondary hyperparathyroidism of renal origin: Secondary | ICD-10-CM | POA: Diagnosis not present

## 2015-12-21 DIAGNOSIS — N186 End stage renal disease: Secondary | ICD-10-CM | POA: Diagnosis not present

## 2015-12-21 DIAGNOSIS — Z418 Encounter for other procedures for purposes other than remedying health state: Secondary | ICD-10-CM | POA: Diagnosis not present

## 2015-12-21 DIAGNOSIS — E875 Hyperkalemia: Secondary | ICD-10-CM

## 2015-12-21 DIAGNOSIS — D631 Anemia in chronic kidney disease: Secondary | ICD-10-CM | POA: Diagnosis not present

## 2015-12-21 HISTORY — DX: Hyperkalemia: E87.5

## 2015-12-23 ENCOUNTER — Encounter (HOSPITAL_COMMUNITY): Payer: Self-pay | Admitting: Emergency Medicine

## 2015-12-23 ENCOUNTER — Emergency Department (HOSPITAL_COMMUNITY)
Admission: EM | Admit: 2015-12-23 | Discharge: 2015-12-23 | Disposition: A | Discharge status: Home / Self Care | Payer: Medicare Other | Attending: Emergency Medicine | Admitting: Emergency Medicine

## 2015-12-23 DIAGNOSIS — T8611 Kidney transplant rejection: Secondary | ICD-10-CM | POA: Diagnosis not present

## 2015-12-23 DIAGNOSIS — Z7901 Long term (current) use of anticoagulants: Secondary | ICD-10-CM | POA: Insufficient documentation

## 2015-12-23 DIAGNOSIS — I509 Heart failure, unspecified: Secondary | ICD-10-CM | POA: Diagnosis not present

## 2015-12-23 DIAGNOSIS — R112 Nausea with vomiting, unspecified: Secondary | ICD-10-CM | POA: Diagnosis not present

## 2015-12-23 DIAGNOSIS — Z87891 Personal history of nicotine dependence: Secondary | ICD-10-CM | POA: Diagnosis not present

## 2015-12-23 DIAGNOSIS — Z992 Dependence on renal dialysis: Secondary | ICD-10-CM | POA: Insufficient documentation

## 2015-12-23 DIAGNOSIS — R188 Other ascites: Secondary | ICD-10-CM | POA: Diagnosis not present

## 2015-12-23 DIAGNOSIS — E119 Type 2 diabetes mellitus without complications: Secondary | ICD-10-CM | POA: Insufficient documentation

## 2015-12-23 DIAGNOSIS — R5383 Other fatigue: Secondary | ICD-10-CM | POA: Diagnosis not present

## 2015-12-23 DIAGNOSIS — R1030 Lower abdominal pain, unspecified: Secondary | ICD-10-CM | POA: Diagnosis not present

## 2015-12-23 DIAGNOSIS — N186 End stage renal disease: Secondary | ICD-10-CM | POA: Insufficient documentation

## 2015-12-23 DIAGNOSIS — R1084 Generalized abdominal pain: Secondary | ICD-10-CM | POA: Insufficient documentation

## 2015-12-23 DIAGNOSIS — Z79899 Other long term (current) drug therapy: Secondary | ICD-10-CM | POA: Diagnosis not present

## 2015-12-23 DIAGNOSIS — R1032 Left lower quadrant pain: Secondary | ICD-10-CM | POA: Diagnosis not present

## 2015-12-23 DIAGNOSIS — N451 Epididymitis: Secondary | ICD-10-CM | POA: Diagnosis not present

## 2015-12-23 DIAGNOSIS — F329 Major depressive disorder, single episode, unspecified: Secondary | ICD-10-CM | POA: Diagnosis not present

## 2015-12-23 DIAGNOSIS — I132 Hypertensive heart and chronic kidney disease with heart failure and with stage 5 chronic kidney disease, or end stage renal disease: Secondary | ICD-10-CM | POA: Diagnosis not present

## 2015-12-23 DIAGNOSIS — I313 Pericardial effusion (noninflammatory): Secondary | ICD-10-CM | POA: Diagnosis not present

## 2015-12-23 LAB — COMPREHENSIVE METABOLIC PANEL
ALT: 12 U/L — AB (ref 17–63)
ANION GAP: 12 (ref 5–15)
AST: 48 U/L — ABNORMAL HIGH (ref 15–41)
Albumin: 2.8 g/dL — ABNORMAL LOW (ref 3.5–5.0)
Alkaline Phosphatase: 91 U/L (ref 38–126)
BUN: 40 mg/dL — ABNORMAL HIGH (ref 6–20)
CHLORIDE: 101 mmol/L (ref 101–111)
CO2: 24 mmol/L (ref 22–32)
Calcium: 8.9 mg/dL (ref 8.9–10.3)
Creatinine, Ser: 11.54 mg/dL — ABNORMAL HIGH (ref 0.61–1.24)
GFR calc non Af Amer: 4 mL/min — ABNORMAL LOW (ref >60)
GFR, EST AFRICAN AMERICAN: 5 mL/min — AB (ref >60)
Glucose, Bld: 81 mg/dL (ref 65–99)
Potassium: 5 mmol/L (ref 3.5–5.1)
SODIUM: 137 mmol/L (ref 135–145)
Total Bilirubin: 0.5 mg/dL (ref 0.3–1.2)
Total Protein: 7.7 g/dL (ref 6.5–8.1)

## 2015-12-23 LAB — CBC
HCT: 23.7 % — ABNORMAL LOW (ref 39.0–52.0)
HEMOGLOBIN: 7.4 g/dL — AB (ref 13.0–17.0)
MCH: 26.7 pg (ref 26.0–34.0)
MCHC: 31.2 g/dL (ref 30.0–36.0)
MCV: 85.6 fL (ref 78.0–100.0)
Platelets: 234 10*3/uL (ref 150–400)
RBC: 2.77 MIL/uL — AB (ref 4.22–5.81)
RDW: 15.7 % — ABNORMAL HIGH (ref 11.5–15.5)
WBC: 6 10*3/uL (ref 4.0–10.5)

## 2015-12-23 LAB — LIPASE, BLOOD: LIPASE: 42 U/L (ref 11–51)

## 2015-12-23 MED ORDER — HYDROMORPHONE HCL 1 MG/ML IJ SOLN
1.0000 mg | Freq: Once | INTRAMUSCULAR | Status: AC
  Administered 2015-12-23: 1 mg via INTRAVENOUS
  Filled 2015-12-23: qty 1

## 2015-12-23 MED ORDER — ONDANSETRON HCL 4 MG/2ML IJ SOLN
4.0000 mg | Freq: Once | INTRAMUSCULAR | Status: AC
  Administered 2015-12-23: 4 mg via INTRAVENOUS
  Filled 2015-12-23: qty 2

## 2015-12-23 NOTE — ED Notes (Addendum)
Patient is a dialysis patient (Tu, Th, Sat). Reports anuria.

## 2015-12-23 NOTE — ED Provider Notes (Signed)
CSN: [id number]     Arrival date & time 12/23/15  0348 History   First MD Initiated Contact with Patient 12/23/15 [phone]     Chief Complaint  Patient presents with  . Abdominal Pain     (Consider location/radiation/quality/duration/timing/severity/associated sxs/prior Treatment) HPI Patient presents to the emergency department with chronic abdominal pain that he states got worse last night.  The patient states that he did do his dialysis Thursday and is due for dialysis today.  The patient states that he has a cyst on his kidney.  This been causing him pain and is due to have it removed on the 16th of this month.  Patient states that he ran out of pain medicine at home.  He has no other complaints.  He states that nothing seems make the condition better.  Palpation makes the pain worse.  He should not had a recent CT scan done did not show any significant abnormalities. The patient denies chest pain, shortness of breath, headache,blurred vision, neck pain, fever, cough, weakness, numbness, dizziness, anorexia, edema, abdominal pain, nausea, vomiting, diarrhea, rash, back pain, dysuria, hematemesis, bloody stool, near syncope, or syncope. Past Medical History  Diagnosis Date  . Hypertension   . Depression   . Complication of anesthesia     itching, sore throat  . Shortness of breath   . Anxiety   . CHF (congestive heart failure) ([place])   . Anemia   . Dialysis patient ([place])   . Type II diabetes mellitus (HCC)     No history per patient, but remains under history as A1c would not be accurate given on dialysis  . ESRD (end stage renal disease) ([place])     due to HTN per patient, followed at [facility], s/p failed kidney transplant - dialysis Tue, Th, Sat  . Renal insufficiency    Past Surgical History  Procedure Laterality Date  . Kidney receipient  2006    failed and started HD in March 2014  . Capd insertion    . Capd removal    . Left heart catheterization with coronary angiogram N/A  09/02/2014    Procedure: LEFT HEART CATHETERIZATION WITH CORONARY ANGIOGRAM;  Surgeon: [name], MD;  Location: [facility] CATH LAB;  Service: Cardiovascular;  Laterality: N/A;  . Inguinal hernia repair Right 02/14/2015    Procedure: REPAIR INCARCERATED RIGHT INGUINAL HERNIA;  Surgeon: [name], MD;  Location: [place];  Service: General;  Laterality: Right;  . Insertion of dialysis catheter Right 09/23/2015    Procedure: exchange of Right internal Dialysis Catheter.;  Surgeon: [name], MD;  Location: [facility] OR;  Service: Vascular;  Laterality: Right;   Family History  Problem Relation Age of Onset  . Hypertension Other    Social History  Substance Use Topics  . Smoking status: Former Smoker -- 0.00 packs/day for 1 years    Types: Cigarettes  . Smokeless tobacco: Never Used     Comment: quit Jan 2014  . Alcohol Use: No    Review of Systems All other systems negative except as documented in the HPI. All pertinent positives and negatives as reviewed in the HPI.   Allergies  Butalbital-apap-caffeine; Ferrlecit; Minoxidil; and Darvocet  Home Medications   Prior to Admission medications   Medication Sig Start Date End Date Taking? Authorizing Provider  ALPRAZolam [name]) 0.5 MG tablet Take 1 tablet (0.5 mg total) by mouth 2 (two) times daily as needed for anxiety. 11/04/15  Yes [name], MD  amLODipine (NORVASC) 10  MG tablet Take 10 mg by mouth daily.   Yes Historical Provider, MD  atorvastatin (LIPITOR) 40 MG tablet Take 1 tablet (40 mg total) by mouth daily at 6 PM. 09/02/14  Yes Barton Dubois, MD  carvedilol (COREG) 12.5 MG tablet Take 1 tablet (12.5 mg total) by mouth 2 (two) times daily with a meal. 11/04/15  Yes [name], MD  cinacalcet (SENSIPAR) 30 MG tablet Take 30 mg by mouth daily.   Yes Historical Provider, MD  cyclobenzaprine (FLEXERIL) 10 MG tablet Take 1 tablet (10 mg total) by mouth 3 (three) times daily as needed for muscle spasms. 11/04/15  Yes [name],  MD  docusate sodium (COLACE) 100 MG capsule Take 1 capsule (100 mg total) by mouth 2 (two) times daily. 11/19/14  Yes [name], MD  doxercalciferol (HECTOROL) 4 MCG/2ML injection Inject 1.25 mLs (2.5 mcg total) into the vein Every Tuesday,Thursday,and Saturday with dialysis. 10/10/14  Yes [name], DO  gabapentin (NEURONTIN) 100 MG capsule Take 1 capsule (100 mg total) by mouth 3 (three) times daily. 11/04/15 03/21/16 Yes [name], MD  hydrALAZINE (APRESOLINE) 50 MG tablet Take 2 tablets (100 mg total) by mouth every 8 (eight) hours. 09/11/15  Yes [name], MD  hydrocerin (EUCERIN) CREA Apply 1 application topically 2 (two) times daily. 02/04/15  Yes [name], MD  hydrOXYzine (ATARAX/VISTARIL) 10 MG tablet Take 1 tablet (10 mg total) by mouth 3 (three) times daily as needed for itching. 09/27/15  Yes [name], MD  hyoscyamine (LEVSIN/SL) 0.125 MG SL tablet Place 1 tablet (0.125 mg total) under the tongue every 4 (four) hours as needed. Patient taking differently: Place 0.125 mg under the tongue every 4 (four) hours as needed for cramping.  11/17/15  Yes April Palumbo, MD  ibuprofen (ADVIL,MOTRIN) 600 MG tablet Take 1 tablet (600 mg total) by mouth every 8 (eight) hours as needed. Patient taking differently: Take 600 mg by mouth every 8 (eight) hours as needed for moderate pain.  10/06/15  Yes [name], MD  isosorbide mononitrate (IMDUR) 30 MG 24 hr tablet Take 3 tablets (90 mg total) by mouth daily. 09/11/15  Yes [name], MD  levalbuterol [facility] HFA) 45 MCG/ACT inhaler Inhale 2 puffs into the lungs every 6 (six) hours as needed for wheezing or shortness of breath. 11/19/14  Yes [name], MD  lisinopril (PRINIVIL,ZESTRIL) 20 MG tablet Take 1 tablet (20 mg total) by mouth daily. 02/04/15  Yes [name], MD  metoCLOPramide (REGLAN) 10 MG tablet Take 1 tablet (10 mg total) by mouth every 6 (six) hours as needed for nausea or vomiting. 12/17/15  Yes  Orpah Greek, MD  multivitamin (RENA-VIT) TABS tablet Take 1 tablet by mouth at bedtime. 02/04/15  Yes [name], MD  omeprazole (PRILOSEC) 20 MG capsule Take 20 mg by mouth daily. 07/01/13  Yes Historical Provider, MD  ondansetron (ZOFRAN) 4 MG tablet Take 1 tablet (4 mg total) by mouth every 6 (six) hours as needed for nausea or vomiting. 12/17/15  Yes Orpah Greek, MD  oxyCODONE-acetaminophen (PERCOCET/ROXICET) 5-325 MG tablet Take 1-2 tablets by mouth every 6 (six) hours as needed for severe pain. 11/13/15  Yes Carlisle Cater, PA-C  predniSONE (DELTASONE) 5 MG tablet Take 5 mg by mouth daily with breakfast.   Yes Historical Provider, MD  sevelamer carbonate (RENVELA) 800 MG tablet Take 800-1,600 mg by mouth 3 (three) times daily with meals. 2  tabs three times daily with meals, and 1 tablet with snacks   Yes Historical Provider, MD  traMADol (ULTRAM) 50 MG tablet Take 1 tablet (50 mg total) by mouth every 6 (six) hours as needed for moderate pain. 11/04/15  Yes [name], MD  warfarin (COUMADIN) 6 MG tablet Take 1.5 tablets (9 mg total) by mouth daily at 6 PM. Take daily until adjusted per MD 09/12/15  Yes [name], MD   BP 179/119 mmHg  Pulse 90  Temp(Src) 97.8 F (36.6 C) (Oral)  Resp 24  Ht _0  (1.88 m)  Wt 74.844 kg  BMI 21.18 kg/m2  SpO2 93% Physical Exam  Constitutional: He is oriented to person, place, and time. He appears well-developed and well-nourished. No distress.  HENT:  Head: Normocephalic and atraumatic.  Mouth/Throat: Oropharynx is clear and moist.  Eyes: Pupils are equal, round, and reactive to light.  Neck: Normal range of motion. Neck supple.  Cardiovascular: Normal rate, regular rhythm and normal heart sounds.  Exam reveals no gallop and no friction rub.   No murmur heard. Pulmonary/Chest: Effort normal and breath sounds normal. No respiratory distress. He has no wheezes.  Abdominal: Soft. Bowel sounds are normal. He exhibits no  distension. There is tenderness. There is no rebound and no guarding.  Neurological: He is alert and oriented to person, place, and time. He exhibits normal muscle tone. Coordination normal.  Skin: Skin is warm and dry. No rash noted. No erythema.  Psychiatric: He has a normal mood and affect. His behavior is normal.  Nursing note and vitals reviewed.   ED Course  Procedures (including critical care time) Labs Review Labs Reviewed  COMPREHENSIVE METABOLIC PANEL - Abnormal; Notable for the following:    BUN 40 (*)    Creatinine, Ser 11.54 (*)    Albumin 2.8 (*)    AST 48 (*)    ALT 12 (*)    GFR calc non Af Amer 4 (*)    GFR calc Af Amer 5 (*)    All other components within normal limits  CBC - Abnormal; Notable for the following:    RBC 2.77 (*)    Hemoglobin 7.4 (*)    HCT 23.7 (*)    RDW 15.7 (*)    All other components within normal limits  LIPASE, BLOOD    Imaging Review No results found. I have personally reviewed and evaluated these images and lab results as part of my medical decision-making.  Patient is feeling better at this time and will be discharged and go to have his dialysis.  The patient is advised return here as needed.  Patient agrees the plan and all questions were answered.  I feel that this is his chronic pain and does not further need any workup   Dalia Heading, PA-C 12/24/15 1724  Sherwood Gambler, MD 12/25/15 [phone]

## 2015-12-23 NOTE — Discharge Instructions (Signed)
Return here as needed. Follow up with your doctor. You need to go to dialysis today.

## 2015-12-23 NOTE — ED Notes (Signed)
Pt requesting pain medication-PA aware.

## 2015-12-23 NOTE — ED Notes (Signed)
Pt states he woke up with R sided back pain that radiates to R abd with nausea, vomiting, and diarrhea since 1:30am.

## 2015-12-24 DIAGNOSIS — Z86718 Personal history of other venous thrombosis and embolism: Secondary | ICD-10-CM | POA: Diagnosis not present

## 2015-12-24 DIAGNOSIS — E875 Hyperkalemia: Secondary | ICD-10-CM | POA: Diagnosis present

## 2015-12-24 DIAGNOSIS — N186 End stage renal disease: Secondary | ICD-10-CM | POA: Diagnosis not present

## 2015-12-24 DIAGNOSIS — I5022 Chronic systolic (congestive) heart failure: Secondary | ICD-10-CM | POA: Diagnosis present

## 2015-12-24 DIAGNOSIS — Z86711 Personal history of pulmonary embolism: Secondary | ICD-10-CM | POA: Diagnosis not present

## 2015-12-24 DIAGNOSIS — T8611 Kidney transplant rejection: Secondary | ICD-10-CM | POA: Diagnosis present

## 2015-12-24 DIAGNOSIS — R1084 Generalized abdominal pain: Secondary | ICD-10-CM | POA: Diagnosis not present

## 2015-12-24 DIAGNOSIS — R188 Other ascites: Secondary | ICD-10-CM | POA: Diagnosis present

## 2015-12-24 DIAGNOSIS — K219 Gastro-esophageal reflux disease without esophagitis: Secondary | ICD-10-CM | POA: Diagnosis present

## 2015-12-24 DIAGNOSIS — T8612 Kidney transplant failure: Secondary | ICD-10-CM | POA: Diagnosis not present

## 2015-12-24 DIAGNOSIS — G629 Polyneuropathy, unspecified: Secondary | ICD-10-CM | POA: Diagnosis not present

## 2015-12-24 DIAGNOSIS — R1031 Right lower quadrant pain: Secondary | ICD-10-CM | POA: Diagnosis present

## 2015-12-24 DIAGNOSIS — R197 Diarrhea, unspecified: Secondary | ICD-10-CM | POA: Diagnosis present

## 2015-12-24 DIAGNOSIS — G4733 Obstructive sleep apnea (adult) (pediatric): Secondary | ICD-10-CM | POA: Diagnosis present

## 2015-12-24 DIAGNOSIS — R05 Cough: Secondary | ICD-10-CM | POA: Diagnosis not present

## 2015-12-24 DIAGNOSIS — R112 Nausea with vomiting, unspecified: Secondary | ICD-10-CM | POA: Diagnosis present

## 2015-12-24 DIAGNOSIS — R1030 Lower abdominal pain, unspecified: Secondary | ICD-10-CM | POA: Diagnosis not present

## 2015-12-24 DIAGNOSIS — G8929 Other chronic pain: Secondary | ICD-10-CM | POA: Diagnosis present

## 2015-12-24 DIAGNOSIS — N2889 Other specified disorders of kidney and ureter: Secondary | ICD-10-CM | POA: Diagnosis present

## 2015-12-24 DIAGNOSIS — M109 Gout, unspecified: Secondary | ICD-10-CM | POA: Diagnosis not present

## 2015-12-24 DIAGNOSIS — R109 Unspecified abdominal pain: Secondary | ICD-10-CM | POA: Diagnosis not present

## 2015-12-24 DIAGNOSIS — N50819 Testicular pain, unspecified: Secondary | ICD-10-CM | POA: Diagnosis not present

## 2015-12-24 DIAGNOSIS — N1330 Unspecified hydronephrosis: Secondary | ICD-10-CM | POA: Diagnosis not present

## 2015-12-24 DIAGNOSIS — Z7901 Long term (current) use of anticoagulants: Secondary | ICD-10-CM | POA: Diagnosis not present

## 2015-12-24 DIAGNOSIS — E785 Hyperlipidemia, unspecified: Secondary | ICD-10-CM | POA: Diagnosis not present

## 2015-12-24 DIAGNOSIS — Z940 Kidney transplant status: Secondary | ICD-10-CM | POA: Diagnosis not present

## 2015-12-24 DIAGNOSIS — N1339 Other hydronephrosis: Secondary | ICD-10-CM | POA: Diagnosis present

## 2015-12-24 DIAGNOSIS — R0600 Dyspnea, unspecified: Secondary | ICD-10-CM | POA: Diagnosis present

## 2015-12-24 DIAGNOSIS — R1032 Left lower quadrant pain: Secondary | ICD-10-CM | POA: Diagnosis present

## 2015-12-24 DIAGNOSIS — N451 Epididymitis: Secondary | ICD-10-CM | POA: Diagnosis not present

## 2015-12-24 DIAGNOSIS — Z992 Dependence on renal dialysis: Secondary | ICD-10-CM | POA: Diagnosis not present

## 2015-12-24 DIAGNOSIS — I313 Pericardial effusion (noninflammatory): Secondary | ICD-10-CM | POA: Diagnosis present

## 2015-12-24 DIAGNOSIS — I132 Hypertensive heart and chronic kidney disease with heart failure and with stage 5 chronic kidney disease, or end stage renal disease: Secondary | ICD-10-CM | POA: Diagnosis present

## 2015-12-24 DIAGNOSIS — I4581 Long QT syndrome: Secondary | ICD-10-CM | POA: Diagnosis not present

## 2015-12-24 DIAGNOSIS — J810 Acute pulmonary edema: Secondary | ICD-10-CM | POA: Diagnosis not present

## 2015-12-24 DIAGNOSIS — N433 Hydrocele, unspecified: Secondary | ICD-10-CM | POA: Diagnosis present

## 2015-12-24 DIAGNOSIS — I120 Hypertensive chronic kidney disease with stage 5 chronic kidney disease or end stage renal disease: Secondary | ICD-10-CM | POA: Diagnosis not present

## 2015-12-24 DIAGNOSIS — R791 Abnormal coagulation profile: Secondary | ICD-10-CM | POA: Diagnosis not present

## 2015-12-24 DIAGNOSIS — D631 Anemia in chronic kidney disease: Secondary | ICD-10-CM | POA: Diagnosis present

## 2015-12-28 DIAGNOSIS — N186 End stage renal disease: Secondary | ICD-10-CM | POA: Diagnosis not present

## 2015-12-28 DIAGNOSIS — E1139 Type 2 diabetes mellitus with other diabetic ophthalmic complication: Secondary | ICD-10-CM | POA: Diagnosis not present

## 2015-12-28 DIAGNOSIS — N2581 Secondary hyperparathyroidism of renal origin: Secondary | ICD-10-CM | POA: Diagnosis not present

## 2015-12-28 DIAGNOSIS — D631 Anemia in chronic kidney disease: Secondary | ICD-10-CM | POA: Diagnosis not present

## 2015-12-28 DIAGNOSIS — Z418 Encounter for other procedures for purposes other than remedying health state: Secondary | ICD-10-CM | POA: Diagnosis not present

## 2015-12-28 DIAGNOSIS — Z7901 Long term (current) use of anticoagulants: Secondary | ICD-10-CM | POA: Diagnosis not present

## 2015-12-30 ENCOUNTER — Encounter (HOSPITAL_COMMUNITY): Payer: Self-pay | Admitting: Emergency Medicine

## 2015-12-30 DIAGNOSIS — N5082 Scrotal pain: Secondary | ICD-10-CM | POA: Insufficient documentation

## 2015-12-30 DIAGNOSIS — N2581 Secondary hyperparathyroidism of renal origin: Secondary | ICD-10-CM | POA: Diagnosis not present

## 2015-12-30 DIAGNOSIS — I509 Heart failure, unspecified: Secondary | ICD-10-CM | POA: Diagnosis not present

## 2015-12-30 DIAGNOSIS — Z418 Encounter for other procedures for purposes other than remedying health state: Secondary | ICD-10-CM | POA: Diagnosis not present

## 2015-12-30 DIAGNOSIS — Z992 Dependence on renal dialysis: Secondary | ICD-10-CM | POA: Diagnosis not present

## 2015-12-30 DIAGNOSIS — Z79899 Other long term (current) drug therapy: Secondary | ICD-10-CM | POA: Diagnosis not present

## 2015-12-30 DIAGNOSIS — I132 Hypertensive heart and chronic kidney disease with heart failure and with stage 5 chronic kidney disease, or end stage renal disease: Secondary | ICD-10-CM | POA: Diagnosis not present

## 2015-12-30 DIAGNOSIS — Z791 Long term (current) use of non-steroidal anti-inflammatories (NSAID): Secondary | ICD-10-CM | POA: Insufficient documentation

## 2015-12-30 DIAGNOSIS — I120 Hypertensive chronic kidney disease with stage 5 chronic kidney disease or end stage renal disease: Secondary | ICD-10-CM | POA: Diagnosis not present

## 2015-12-30 DIAGNOSIS — N50819 Testicular pain, unspecified: Secondary | ICD-10-CM | POA: Diagnosis not present

## 2015-12-30 DIAGNOSIS — Z79891 Long term (current) use of opiate analgesic: Secondary | ICD-10-CM | POA: Insufficient documentation

## 2015-12-30 DIAGNOSIS — Z87891 Personal history of nicotine dependence: Secondary | ICD-10-CM | POA: Diagnosis not present

## 2015-12-30 DIAGNOSIS — R1031 Right lower quadrant pain: Secondary | ICD-10-CM | POA: Diagnosis not present

## 2015-12-30 DIAGNOSIS — N186 End stage renal disease: Secondary | ICD-10-CM | POA: Diagnosis not present

## 2015-12-30 DIAGNOSIS — Z7901 Long term (current) use of anticoagulants: Secondary | ICD-10-CM | POA: Insufficient documentation

## 2015-12-30 DIAGNOSIS — D631 Anemia in chronic kidney disease: Secondary | ICD-10-CM | POA: Diagnosis not present

## 2015-12-30 LAB — COMPREHENSIVE METABOLIC PANEL
ALK PHOS: 112 U/L (ref 38–126)
ALT: 10 U/L — AB (ref 17–63)
ANION GAP: 9 (ref 5–15)
AST: 43 U/L — ABNORMAL HIGH (ref 15–41)
Albumin: 3 g/dL — ABNORMAL LOW (ref 3.5–5.0)
BILIRUBIN TOTAL: 0.6 mg/dL (ref 0.3–1.2)
BUN: 22 mg/dL — ABNORMAL HIGH (ref 6–20)
CALCIUM: 8.9 mg/dL (ref 8.9–10.3)
CO2: 27 mmol/L (ref 22–32)
CREATININE: 7.36 mg/dL — AB (ref 0.61–1.24)
Chloride: 101 mmol/L (ref 101–111)
GFR, EST AFRICAN AMERICAN: 9 mL/min — AB (ref >60)
GFR, EST NON AFRICAN AMERICAN: 8 mL/min — AB (ref >60)
Glucose, Bld: 80 mg/dL (ref 65–99)
Potassium: 4.5 mmol/L (ref 3.5–5.1)
SODIUM: 137 mmol/L (ref 135–145)
TOTAL PROTEIN: 8.1 g/dL (ref 6.5–8.1)

## 2015-12-30 LAB — CBC
HCT: 25.7 % — ABNORMAL LOW (ref 39.0–52.0)
HEMOGLOBIN: 8.1 g/dL — AB (ref 13.0–17.0)
MCH: 26.8 pg (ref 26.0–34.0)
MCHC: 31.5 g/dL (ref 30.0–36.0)
MCV: 85.1 fL (ref 78.0–100.0)
PLATELETS: 200 10*3/uL (ref 150–400)
RBC: 3.02 MIL/uL — AB (ref 4.22–5.81)
RDW: 16 % — ABNORMAL HIGH (ref 11.5–15.5)
WBC: 5.2 10*3/uL (ref 4.0–10.5)

## 2015-12-30 LAB — PROTIME-INR
INR: 1.34 (ref 0.00–1.49)
PROTHROMBIN TIME: 16.7 s — AB (ref 11.6–15.2)

## 2015-12-30 LAB — LIPASE, BLOOD: Lipase: 27 U/L (ref 11–51)

## 2015-12-30 NOTE — ED Notes (Signed)
Pt states his "coumadin level was off".

## 2015-12-30 NOTE — ED Notes (Signed)
Pt states "my right kidney is hurting down to my scrotum". Pt states he had dialysis today and was sent down here because his blood level was off. Pt states he also feels nauseated. Pt also c/o RLQ abdominal pain.

## 2015-12-31 ENCOUNTER — Emergency Department (HOSPITAL_COMMUNITY): Payer: Medicare Other

## 2015-12-31 ENCOUNTER — Emergency Department (HOSPITAL_COMMUNITY)
Admission: EM | Admit: 2015-12-31 | Discharge: 2015-12-31 | Disposition: A | Discharge status: Home / Self Care | Payer: Medicare Other | Attending: Emergency Medicine | Admitting: Emergency Medicine

## 2015-12-31 DIAGNOSIS — K2970 Gastritis, unspecified, without bleeding: Secondary | ICD-10-CM | POA: Diagnosis not present

## 2015-12-31 DIAGNOSIS — R112 Nausea with vomiting, unspecified: Secondary | ICD-10-CM | POA: Diagnosis not present

## 2015-12-31 DIAGNOSIS — Z992 Dependence on renal dialysis: Secondary | ICD-10-CM

## 2015-12-31 DIAGNOSIS — N186 End stage renal disease: Secondary | ICD-10-CM

## 2015-12-31 DIAGNOSIS — R1030 Lower abdominal pain, unspecified: Secondary | ICD-10-CM

## 2015-12-31 DIAGNOSIS — N50819 Testicular pain, unspecified: Secondary | ICD-10-CM | POA: Diagnosis not present

## 2015-12-31 DIAGNOSIS — N5082 Scrotal pain: Secondary | ICD-10-CM

## 2015-12-31 MED ORDER — HYDROMORPHONE HCL 1 MG/ML IJ SOLN
1.0000 mg | Freq: Once | INTRAMUSCULAR | Status: AC
  Administered 2015-12-31: 1 mg via INTRAVENOUS
  Filled 2015-12-31: qty 1

## 2015-12-31 MED ORDER — ONDANSETRON HCL 4 MG/2ML IJ SOLN
4.0000 mg | Freq: Once | INTRAMUSCULAR | Status: AC
  Administered 2015-12-31: 4 mg via INTRAVENOUS
  Filled 2015-12-31: qty 2

## 2015-12-31 MED ORDER — SODIUM CHLORIDE 0.9 % IV SOLN
1000.0000 mL | Freq: Once | INTRAVENOUS | Status: AC
  Administered 2015-12-31: 1000 mL via INTRAVENOUS

## 2015-12-31 MED ORDER — ONDANSETRON 8 MG PO TBDP: ORAL_TABLET | ORAL | Status: DC

## 2015-12-31 MED ORDER — ENOXAPARIN SODIUM 80 MG/0.8ML ~~LOC~~ SOLN
1.0000 mg/kg | Freq: Once | SUBCUTANEOUS | Status: AC
  Administered 2015-12-31: 75 mg via SUBCUTANEOUS
  Filled 2015-12-31: qty 0.8

## 2015-12-31 MED ORDER — SODIUM CHLORIDE 0.9 % IV SOLN
1000.0000 mL | INTRAVENOUS | Status: DC
  Administered 2015-12-31: 1000 mL via INTRAVENOUS

## 2015-12-31 MED ORDER — HYDROCODONE-ACETAMINOPHEN 5-325 MG PO TABS: 1.0000 | ORAL_TABLET | Freq: Four times a day (QID) | ORAL | Status: DC | PRN

## 2015-12-31 NOTE — ED Notes (Signed)
Dr. [name] at bedside.

## 2015-12-31 NOTE — ED Notes (Signed)
Patient transported to Ultrasound 

## 2015-12-31 NOTE — ED Provider Notes (Signed)
Care assumed from Dr. [name] at shift change. Patient awaiting results of a scrotal ultrasound to further evaluate the cause of his scrotal pain. The ultrasound has been read by radiology and reported as negative. This patient is asking for more pain medication and nausea medication. He receives frequent prescriptions from multiple providers for this. I've advised him I will write him a small quantity of pain medication and Zofran, however future prescriptions absolutely must be obtained from his primary Dr.  Veryl Speak, MD 12/31/15 [phone]

## 2015-12-31 NOTE — ED Provider Notes (Signed)
CSN: [id number]     Arrival date & time 12/30/15  2126 History  By signing my name below, I, Altamease Oiler, attest that this documentation has been prepared under the direction and in the presence of Delora Fuel, MD. Electronically Signed: Altamease Oiler, ED Scribe. 12/31/2015. 3:01 AM    Chief Complaint  Patient presents with  . Abdominal Pain  . Abnormal Lab   The history is provided by the patient. No language interpreter was used.   Frank [name] is a 50 y.o. male with PMHx of ESRD on dialysis, HTN, DM, and CHF who presents to the Emergency Department complaining of constant throbbing, 7-8/10 in severity, right lower abdominal pain with onset 2 days ago that he associates with his kidney. Pt states that his transplanted right kidney failed and has an abscess on it. The kidney is reportedly scheduled to be removed in 10 days. The pain is worse with movement and coughing. He took nothing for pain at home.  Associated symptoms include scrotal pain, fever up to 101, nausea, and vomiting for 2 days. Additionally he states that his dialysis nurse called last night and advised him to come to the ED because his INR was abnormal. Pt states that he is on coumadin for blood clots in his lungs and around his heart. He was dialyzed yesterday.   Past Medical History  Diagnosis Date  . Hypertension   . Depression   . Complication of anesthesia     itching, sore throat  . Shortness of breath   . Anxiety   . CHF (congestive heart failure) ([place])   . Anemia   . Dialysis patient ([place])   . Type II diabetes mellitus (HCC)     No history per patient, but remains under history as A1c would not be accurate given on dialysis  . ESRD (end stage renal disease) ([place])     due to HTN per patient, followed at [facility], s/p failed kidney transplant - dialysis Tue, Th, Sat  . Renal insufficiency    Past Surgical History  Procedure Laterality Date  . Kidney receipient  2006    failed and started HD in March 2014   . Capd insertion    . Capd removal    . Left heart catheterization with coronary angiogram N/A 09/02/2014    Procedure: LEFT HEART CATHETERIZATION WITH CORONARY ANGIOGRAM;  Surgeon: [name], MD;  Location[facility] CATH LAB;  Service: Cardiovascular;  Laterality: N/A;  . Inguinal hernia repair Right 02/14/2015    Procedure: REPAIR INCARCERATED RIGHT INGUINAL HERNIA;  Surgeon: [name], MD;  Location: [place];  Service: General;  Laterality: Right;  . Insertion of dialysis catheter Right 09/23/2015    Procedure: exchange of Right internal Dialysis Catheter.;  Surgeon: [name], MD;  Location: [facility] OR;  Service: Vascular;  Laterality: Right;   Family History  Problem Relation Age of Onset  . Hypertension Other    Social History  Substance Use Topics  . Smoking status: Former Smoker -- 0.00 packs/day for 1 years    Types: Cigarettes  . Smokeless tobacco: Never Used     Comment: quit Jan 2014  . Alcohol Use: No    Review of Systems  Constitutional: Positive for fever.  Gastrointestinal: Positive for nausea, vomiting and abdominal pain.  Genitourinary: Positive for testicular pain.  All other systems reviewed and are negative.     Allergies  Butalbital-apap-caffeine; Ferrlecit; Minoxidil; and Darvocet  Home Medications   Prior to Admission medications  Medication Sig Start Date End Date Taking? Authorizing Provider  ALPRAZolam [name]) 0.5 MG tablet Take 1 tablet (0.5 mg total) by mouth 2 (two) times daily as needed for anxiety. 11/04/15   [name], MD  amLODipine (NORVASC) 10 MG tablet Take 10 mg by mouth daily.    Historical Provider, MD  atorvastatin (LIPITOR) 40 MG tablet Take 1 tablet (40 mg total) by mouth daily at 6 PM. 09/02/14   Barton Dubois, MD  carvedilol (COREG) 12.5 MG tablet Take 1 tablet (12.5 mg total) by mouth 2 (two) times daily with a meal. 11/04/15   [name], MD  cinacalcet (SENSIPAR) 30 MG tablet Take 30 mg by mouth daily.    Historical  Provider, MD  cyclobenzaprine (FLEXERIL) 10 MG tablet Take 1 tablet (10 mg total) by mouth 3 (three) times daily as needed for muscle spasms. 11/04/15   [name], MD  docusate sodium (COLACE) 100 MG capsule Take 1 capsule (100 mg total) by mouth 2 (two) times daily. 11/19/14   [name], MD  doxercalciferol (HECTOROL) 4 MCG/2ML injection Inject 1.25 mLs (2.5 mcg total) into the vein Every Tuesday,Thursday,and Saturday with dialysis. 10/10/14   [name], DO  gabapentin (NEURONTIN) 100 MG capsule Take 1 capsule (100 mg total) by mouth 3 (three) times daily. 11/04/15 03/21/16  [name], MD  hydrALAZINE (APRESOLINE) 50 MG tablet Take 2 tablets (100 mg total) by mouth every 8 (eight) hours. 09/11/15   [name], MD  hydrocerin (EUCERIN) CREA Apply 1 application topically 2 (two) times daily. 02/04/15   [name], MD  hydrOXYzine (ATARAX/VISTARIL) 10 MG tablet Take 1 tablet (10 mg total) by mouth 3 (three) times daily as needed for itching. 09/27/15   [name], MD  hyoscyamine (LEVSIN/SL) 0.125 MG SL tablet Place 1 tablet (0.125 mg total) under the tongue every 4 (four) hours as needed. Patient taking differently: Place 0.125 mg under the tongue every 4 (four) hours as needed for cramping.  11/17/15   April Palumbo, MD  ibuprofen (ADVIL,MOTRIN) 600 MG tablet Take 1 tablet (600 mg total) by mouth every 8 (eight) hours as needed. Patient taking differently: Take 600 mg by mouth every 8 (eight) hours as needed for moderate pain.  10/06/15   [name], MD  isosorbide mononitrate (IMDUR) 30 MG 24 hr tablet Take 3 tablets (90 mg total) by mouth daily. 09/11/15   [name], MD  levalbuterol [facility] HFA) 45 MCG/ACT inhaler Inhale 2 puffs into the lungs every 6 (six) hours as needed for wheezing or shortness of breath. 11/19/14   [name], MD  lisinopril (PRINIVIL,ZESTRIL) 20 MG tablet Take 1 tablet (20 mg total) by mouth daily. 02/04/15   [name], MD   metoCLOPramide (REGLAN) 10 MG tablet Take 1 tablet (10 mg total) by mouth every 6 (six) hours as needed for nausea or vomiting. 12/17/15   Orpah Greek, MD  multivitamin (RENA-VIT) TABS tablet Take 1 tablet by mouth at bedtime. 02/04/15   [name], MD  omeprazole (PRILOSEC) 20 MG capsule Take 20 mg by mouth daily. 07/01/13   Historical Provider, MD  ondansetron (ZOFRAN) 4 MG tablet Take 1 tablet (4 mg total) by mouth every 6 (six) hours as needed for nausea or vomiting. 12/17/15   Orpah Greek, MD  oxyCODONE-acetaminophen (PERCOCET/ROXICET) 5-325 MG tablet Take 1-2 tablets by mouth every 6 (six) hours as needed for severe pain. 11/13/15  Carlisle Cater, PA-C  predniSONE (DELTASONE) 5 MG tablet Take 5 mg by mouth daily with breakfast.    Historical Provider, MD  sevelamer carbonate (RENVELA) 800 MG tablet Take 800-1,600 mg by mouth 3 (three) times daily with meals. 2 tabs three times daily with meals, and 1 tablet with snacks    Historical Provider, MD  traMADol (ULTRAM) 50 MG tablet Take 1 tablet (50 mg total) by mouth every 6 (six) hours as needed for moderate pain. 11/04/15   [name], MD  warfarin (COUMADIN) 6 MG tablet Take 1.5 tablets (9 mg total) by mouth daily at 6 PM. Take daily until adjusted per MD 09/12/15   [name], MD   BP 180/118 mmHg  Pulse 74  Temp(Src) 99.4 F (37.4 C) (Oral)  Resp 18  Ht _0  (1.88 m)  Wt 165 lb (74.844 kg)  BMI 21.18 kg/m2  SpO2 100% Physical Exam  Constitutional: He is oriented to person, place, and time. He appears well-developed and well-nourished.  Appears uncomfortable, in pain   HENT:  Head: Normocephalic and atraumatic.  Eyes: EOM are normal. Pupils are equal, round, and reactive to light.  Neck: Normal range of motion. Neck supple. No JVD present.  Cardiovascular: Normal rate, regular rhythm and normal heart sounds.   No murmur heard. Pulmonary/Chest: Effort normal and breath sounds normal. No respiratory  distress.  Dialysis access catheter present in right subclavian   Abdominal: Soft. Bowel sounds are normal. He exhibits no distension and no mass. There is tenderness.  Transplanted kidney palpable  Right lower abdomen is tender  Musculoskeletal: Normal range of motion.  Lymphadenopathy:    He has no cervical adenopathy.  Neurological: He is alert and oriented to person, place, and time. No cranial nerve deficit. He exhibits normal muscle tone. Coordination normal.  Skin: Skin is warm and dry. No rash noted.  Nursing note and vitals reviewed.   ED Course  Procedures (including critical care time) DIAGNOSTIC STUDIES: Oxygen Saturation is 100% on RA,  normal by my interpretation.    COORDINATION OF CARE: 2:58 AM Discussed treatment plan which includes lab work, IVF, pain medication, and antiemetic medication with pt at bedside and pt agreed to plan.  Labs Review Results for orders placed or performed during the hospital encounter of 12/31/15  Lipase, blood  Result Value Ref Range   Lipase 27 11 - 51 U/L  Comprehensive metabolic panel  Result Value Ref Range   Sodium 137 135 - 145 mmol/L   Potassium 4.5 3.5 - 5.1 mmol/L   Chloride 101 101 - 111 mmol/L   CO2 27 22 - 32 mmol/L   Glucose, Bld 80 65 - 99 mg/dL   BUN 22 (H) 6 - 20 mg/dL   Creatinine, Ser 7.36 (H) 0.61 - 1.24 mg/dL   Calcium 8.9 8.9 - 10.3 mg/dL   Total Protein 8.1 6.5 - 8.1 g/dL   Albumin 3.0 (L) 3.5 - 5.0 g/dL   AST 43 (H) 15 - 41 U/L   ALT 10 (L) 17 - 63 U/L   Alkaline Phosphatase 112 38 - 126 U/L   Total Bilirubin 0.6 0.3 - 1.2 mg/dL   GFR calc non Af Amer 8 (L) >60 mL/min   GFR calc Af Amer 9 (L) >60 mL/min   Anion gap 9 5 - 15  CBC  Result Value Ref Range   WBC 5.2 4.0 - 10.5 K/uL   RBC 3.02 (L) 4.22 - 5.81 MIL/uL   Hemoglobin 8.1 (  L) 13.0 - 17.0 g/dL   HCT 25.7 (L) 39.0 - 52.0 %   MCV 85.1 78.0 - 100.0 fL   MCH 26.8 26.0 - 34.0 pg   MCHC 31.5 30.0 - 36.0 g/dL   RDW 16.0 (H) 11.5 - 15.5 %    Platelets 200 150 - 400 K/uL  Protime-INR - (order if Patient is taking Coumadin / Warfarin)  Result Value Ref Range   Prothrombin Time 16.7 (H) 11.6 - 15.2 seconds   INR 1.34 0.00 - 1.49   I have personally reviewed and evaluated these lab results as part of my medical decision-making.   MDM   Final diagnoses:  Lower abdominal pain  Scrotal pain  End-stage renal disease on hemodialysis [facility])    Patient with complaints of abdominal pain and scrotal pain. Review of old records shows he has multiple ED visits for chronic abdominal pain and chronic epididymitis. He does have a failed renal transplant which seems to be the source of his abdominal pain. Renal CT in April of this year showed no evidence of renal calculi that show left renal mass which is being worked up as an outpatient. He is given IV fluids and hydromorphone for pain and he seemed much more comfortable but continued to complain of severe pain. After first dose of hydromorphone, his main complaint was scrotal pain. I did not find anything concerning on exam, but given his ongoing pain, ultrasound has been ordered. Case is signed out to Dr. Stark Jock to assess the ultrasound and his response to medications.  I personally performed the services described in this documentation, which was scribed in my presence. The recorded information has been reviewed and is accurate.      Delora Fuel, MD [id number]

## 2015-12-31 NOTE — ED Notes (Signed)
Dr. [name] notified of pt's elevated B/P. States understanding. No new orders at this time.

## 2015-12-31 NOTE — Discharge Instructions (Signed)
Hydrocodone as prescribed as needed for pain. Zofran as prescribed as needed for nausea.  Please follow-up with your primary Dr. for future prescriptions for the above medications.   End-Stage Kidney Disease The kidneys are two organs that lie on either side of the spine between the middle of the back and the front of the abdomen. The kidneys:   Remove wastes and extra water from the blood.   Produce important hormones. These help keep bones strong, regulate blood pressure, and help create red blood cells.   Balance the fluids and chemicals in the blood and tissues. End-stage kidney disease occurs when the kidneys are so damaged that they cannot do their job. When the kidneys cannot do their job, life-threatening problems occur. The body cannot stay clean and strong without the help of the kidneys. In end-stage kidney disease, the kidneys cannot get better.You need a new kidney or treatments to do some of the work healthy kidneys do in order to stay alive. CAUSES  End-stage kidney disease usually occurs when a long-lasting (chronic) kidney disease gets worse. It may also occur after the kidneys are suddenly damaged (acute kidney injury).  SYMPTOMS   Swelling (edema) of the legs, ankles, or feet.   Tiredness (lethargy).   Nausea or vomiting.   Confusion.   Problems with urination, such as:   Decreased urine production.   Frequent urination, especially at night.   Frequent accidents in children who are potty trained.   Muscle twitches and cramps.   Persistent itchiness.   Loss of appetite.   Headaches.   Abnormally dark or light skin.   Numbness in the hands or feet.   Easy bruising.   Frequent hiccups.   Menstruation stops. DIAGNOSIS  Your health care provider will measure your blood pressure and take some tests. These may include:   Urine tests.   Blood tests.   Imaging tests, such as:   An ultrasound exam.   Computed tomography  (CT).  A kidney biopsy. TREATMENT  There are two treatments for end-stage kidney disease:   A procedure that removes toxic wastes from the body (dialysis).   Receiving a new kidney (kidney transplant). Both of these treatments have serious risks and consequences. Your health care provider will help you determine which treatment is best for you based on your health, age, and other factors. In addition to having dialysis or a kidney transplant, you may need to take medicines to control high blood pressure (hypertension) and cholesterol and to decrease phosphorus levels in your blood.  HOME CARE INSTRUCTIONS  Follow your prescribed diet.   Take medicines only as directed by your health care provider.   Do not take any new medicines (prescription, over-the-counter, or nutritional supplements) unless approved by your health care provider. Many medicines can worsen your kidney damage or need to have the dose adjusted.   Keep all follow-up visits as directed by your health care provider. MAKE SURE YOU:  Understand these instructions.  Will watch your condition.  Will get help right away if you are not doing well or get worse.   This information is not intended to replace advice given to you by your health care provider. Make sure you discuss any questions you have with your health care provider.   Document Released: 09/28/2003 Document Revised: 07/29/2014 Document Reviewed: 03/06/2012 Elsevier Interactive Patient Education [employer].

## 2016-01-01 ENCOUNTER — Emergency Department (HOSPITAL_COMMUNITY)
Admission: EM | Admit: 2016-01-01 | Discharge: 2016-01-01 | Disposition: A | Discharge status: Home / Self Care | Payer: Medicare Other | Attending: Emergency Medicine | Admitting: Emergency Medicine

## 2016-01-01 ENCOUNTER — Encounter (HOSPITAL_COMMUNITY): Payer: Self-pay | Admitting: Emergency Medicine

## 2016-01-01 ENCOUNTER — Emergency Department (HOSPITAL_COMMUNITY): Payer: Medicare Other

## 2016-01-01 DIAGNOSIS — N189 Chronic kidney disease, unspecified: Secondary | ICD-10-CM

## 2016-01-01 DIAGNOSIS — J811 Chronic pulmonary edema: Secondary | ICD-10-CM | POA: Diagnosis not present

## 2016-01-01 DIAGNOSIS — Z992 Dependence on renal dialysis: Secondary | ICD-10-CM | POA: Diagnosis not present

## 2016-01-01 DIAGNOSIS — R1031 Right lower quadrant pain: Secondary | ICD-10-CM | POA: Diagnosis not present

## 2016-01-01 DIAGNOSIS — N50811 Right testicular pain: Secondary | ICD-10-CM | POA: Diagnosis not present

## 2016-01-01 DIAGNOSIS — Z888 Allergy status to other drugs, medicaments and biological substances status: Secondary | ICD-10-CM | POA: Diagnosis not present

## 2016-01-01 DIAGNOSIS — I120 Hypertensive chronic kidney disease with stage 5 chronic kidney disease or end stage renal disease: Secondary | ICD-10-CM | POA: Diagnosis not present

## 2016-01-01 DIAGNOSIS — I4581 Long QT syndrome: Secondary | ICD-10-CM | POA: Diagnosis not present

## 2016-01-01 DIAGNOSIS — R600 Localized edema: Secondary | ICD-10-CM | POA: Diagnosis not present

## 2016-01-01 DIAGNOSIS — R109 Unspecified abdominal pain: Secondary | ICD-10-CM

## 2016-01-01 DIAGNOSIS — Z7901 Long term (current) use of anticoagulants: Secondary | ICD-10-CM | POA: Diagnosis not present

## 2016-01-01 DIAGNOSIS — N2889 Other specified disorders of kidney and ureter: Secondary | ICD-10-CM

## 2016-01-01 DIAGNOSIS — D631 Anemia in chronic kidney disease: Secondary | ICD-10-CM | POA: Diagnosis not present

## 2016-01-01 DIAGNOSIS — I509 Heart failure, unspecified: Secondary | ICD-10-CM | POA: Insufficient documentation

## 2016-01-01 DIAGNOSIS — Z7952 Long term (current) use of systemic steroids: Secondary | ICD-10-CM | POA: Diagnosis not present

## 2016-01-01 DIAGNOSIS — E162 Hypoglycemia, unspecified: Secondary | ICD-10-CM | POA: Diagnosis not present

## 2016-01-01 DIAGNOSIS — Z79899 Other long term (current) drug therapy: Secondary | ICD-10-CM | POA: Diagnosis not present

## 2016-01-01 DIAGNOSIS — Z885 Allergy status to narcotic agent status: Secondary | ICD-10-CM | POA: Diagnosis not present

## 2016-01-01 DIAGNOSIS — I517 Cardiomegaly: Secondary | ICD-10-CM | POA: Diagnosis not present

## 2016-01-01 DIAGNOSIS — E1122 Type 2 diabetes mellitus with diabetic chronic kidney disease: Secondary | ICD-10-CM | POA: Diagnosis not present

## 2016-01-01 DIAGNOSIS — Z87891 Personal history of nicotine dependence: Secondary | ICD-10-CM | POA: Insufficient documentation

## 2016-01-01 DIAGNOSIS — N186 End stage renal disease: Secondary | ICD-10-CM | POA: Diagnosis not present

## 2016-01-01 DIAGNOSIS — G4733 Obstructive sleep apnea (adult) (pediatric): Secondary | ICD-10-CM | POA: Diagnosis not present

## 2016-01-01 DIAGNOSIS — K219 Gastro-esophageal reflux disease without esophagitis: Secondary | ICD-10-CM | POA: Diagnosis not present

## 2016-01-01 DIAGNOSIS — I132 Hypertensive heart and chronic kidney disease with heart failure and with stage 5 chronic kidney disease, or end stage renal disease: Secondary | ICD-10-CM | POA: Insufficient documentation

## 2016-01-01 DIAGNOSIS — R1084 Generalized abdominal pain: Secondary | ICD-10-CM | POA: Diagnosis not present

## 2016-01-01 DIAGNOSIS — R069 Unspecified abnormalities of breathing: Secondary | ICD-10-CM | POA: Diagnosis not present

## 2016-01-01 DIAGNOSIS — Z91048 Other nonmedicinal substance allergy status: Secondary | ICD-10-CM | POA: Diagnosis not present

## 2016-01-01 DIAGNOSIS — R188 Other ascites: Secondary | ICD-10-CM | POA: Diagnosis not present

## 2016-01-01 DIAGNOSIS — I444 Left anterior fascicular block: Secondary | ICD-10-CM | POA: Diagnosis not present

## 2016-01-01 DIAGNOSIS — R112 Nausea with vomiting, unspecified: Secondary | ICD-10-CM | POA: Diagnosis not present

## 2016-01-01 DIAGNOSIS — Z940 Kidney transplant status: Secondary | ICD-10-CM | POA: Diagnosis not present

## 2016-01-01 DIAGNOSIS — E875 Hyperkalemia: Secondary | ICD-10-CM | POA: Diagnosis not present

## 2016-01-01 DIAGNOSIS — R0600 Dyspnea, unspecified: Secondary | ICD-10-CM | POA: Diagnosis not present

## 2016-01-01 LAB — CBC WITH DIFFERENTIAL/PLATELET
BASOS PCT: 0 %
Basophils Absolute: 0 10*3/uL (ref 0.0–0.1)
Eosinophils Absolute: 0.5 10*3/uL (ref 0.0–0.7)
Eosinophils Relative: 8 %
HEMATOCRIT: 23.1 % — AB (ref 39.0–52.0)
HEMOGLOBIN: 7.2 g/dL — AB (ref 13.0–17.0)
LYMPHS ABS: 1.1 10*3/uL (ref 0.7–4.0)
LYMPHS PCT: 21 %
MCH: 26.7 pg (ref 26.0–34.0)
MCHC: 31.2 g/dL (ref 30.0–36.0)
MCV: 85.6 fL (ref 78.0–100.0)
MONO ABS: 0.6 10*3/uL (ref 0.1–1.0)
MONOS PCT: 11 %
NEUTROS ABS: 3.2 10*3/uL (ref 1.7–7.7)
NEUTROS PCT: 60 %
Platelets: 158 10*3/uL (ref 150–400)
RBC: 2.7 MIL/uL — ABNORMAL LOW (ref 4.22–5.81)
RDW: 16.1 % — ABNORMAL HIGH (ref 11.5–15.5)
WBC: 5.4 10*3/uL (ref 4.0–10.5)

## 2016-01-01 LAB — COMPREHENSIVE METABOLIC PANEL
ALT: 12 U/L — AB (ref 17–63)
AST: 46 U/L — ABNORMAL HIGH (ref 15–41)
Albumin: 2.7 g/dL — ABNORMAL LOW (ref 3.5–5.0)
Alkaline Phosphatase: 89 U/L (ref 38–126)
Anion gap: 10 (ref 5–15)
BILIRUBIN TOTAL: 0.6 mg/dL (ref 0.3–1.2)
BUN: 37 mg/dL — AB (ref 6–20)
CHLORIDE: 101 mmol/L (ref 101–111)
CO2: 23 mmol/L (ref 22–32)
Calcium: 8.6 mg/dL — ABNORMAL LOW (ref 8.9–10.3)
Creatinine, Ser: 9.57 mg/dL — ABNORMAL HIGH (ref 0.61–1.24)
GFR, EST AFRICAN AMERICAN: 6 mL/min — AB (ref >60)
GFR, EST NON AFRICAN AMERICAN: 6 mL/min — AB (ref >60)
GLUCOSE: 77 mg/dL (ref 65–99)
Potassium: 5.4 mmol/L — ABNORMAL HIGH (ref 3.5–5.1)
Sodium: 134 mmol/L — ABNORMAL LOW (ref 135–145)
TOTAL PROTEIN: 7.4 g/dL (ref 6.5–8.1)

## 2016-01-01 LAB — LIPASE, BLOOD: Lipase: 17 U/L (ref 11–51)

## 2016-01-01 MED ORDER — IOPAMIDOL (ISOVUE-300) INJECTION 61%
INTRAVENOUS | Status: AC
  Administered 2016-01-01: 100 mL
  Filled 2016-01-01: qty 100

## 2016-01-01 MED ORDER — HYDROMORPHONE HCL 1 MG/ML IJ SOLN
1.0000 mg | Freq: Once | INTRAMUSCULAR | Status: AC
  Administered 2016-01-01: 1 mg via INTRAVENOUS
  Filled 2016-01-01: qty 1

## 2016-01-01 MED ORDER — SODIUM CHLORIDE 0.9 % IV SOLN
Freq: Once | INTRAVENOUS | Status: AC
  Administered 2016-01-01: 01:00:00 via INTRAVENOUS

## 2016-01-01 MED ORDER — ONDANSETRON HCL 4 MG/2ML IJ SOLN
4.0000 mg | Freq: Once | INTRAMUSCULAR | Status: AC
  Administered 2016-01-01: 4 mg via INTRAVENOUS
  Filled 2016-01-01: qty 2

## 2016-01-01 NOTE — ED Notes (Signed)
Per EMS, pt from home with c/o abdominal pain. Rates pain 10/10. C/o n/v given 69m zofran PTA. BP-180/120, HR-76. Last dialysis Saturday.

## 2016-01-01 NOTE — ED Provider Notes (Signed)
CSN: [id number]     Arrival date & time 01/01/16  0027 History  By signing my name below, I, Altamease Oiler, attest that this documentation has been prepared under the direction and in the presence of Delora Fuel, MD. Electronically Signed: Altamease Oiler, ED Scribe. 01/01/2016. 12:59 AM     Chief Complaint  Patient presents with  . Abdominal Pain   The history is provided by the patient. No language interpreter was used.   Frank [name] is a 50 y.o. male with PMHx of ESRD on dialysis, HTN, DM, and CHF who presents to the Emergency Department complaining of ongoing, throbbing, severe, right lower abdominal pain with onset 3 days ago. Pt states that he went to sleep and woke up with increased pain and abdominal swelling that makes it hard for him to breathe. He was seen in the ED yesterday took the pain medication that he was prescribed with insufficient relief at home. Associated symptoms include nausea and 2 episodes of emesis. Pt states that he had the failed kidney transplant 3 years ago and is scheduled to have the transplant removed in 8 days.  Past Medical History  Diagnosis Date  . Hypertension   . Depression   . Complication of anesthesia     itching, sore throat  . Shortness of breath   . Anxiety   . CHF (congestive heart failure) ([place])   . Anemia   . Dialysis patient ([place])   . Type II diabetes mellitus (HCC)     No history per patient, but remains under history as A1c would not be accurate given on dialysis  . ESRD (end stage renal disease) ([place])     due to HTN per patient, followed at [facility], s/p failed kidney transplant - dialysis Tue, Th, Sat  . Renal insufficiency    Past Surgical History  Procedure Laterality Date  . Kidney receipient  2006    failed and started HD in March 2014  . Capd insertion    . Capd removal    . Left heart catheterization with coronary angiogram N/A 09/02/2014    Procedure: LEFT HEART CATHETERIZATION WITH CORONARY ANGIOGRAM;  Surgeon: [name], MD;  Location: [facility] CATH LAB;  Service: Cardiovascular;  Laterality: N/A;  . Inguinal hernia repair Right 02/14/2015    Procedure: REPAIR INCARCERATED RIGHT INGUINAL HERNIA;  Surgeon: [name], MD;  Location: [place];  Service: General;  Laterality: Right;  . Insertion of dialysis catheter Right 09/23/2015    Procedure: exchange of Right internal Dialysis Catheter.;  Surgeon: [name], MD;  Location: [facility] OR;  Service: Vascular;  Laterality: Right;   Family History  Problem Relation Age of Onset  . Hypertension Other    Social History  Substance Use Topics  . Smoking status: Former Smoker -- 0.00 packs/day for 1 years    Types: Cigarettes  . Smokeless tobacco: Never Used     Comment: quit Jan 2014  . Alcohol Use: No    Review of Systems  Respiratory: Positive for shortness of breath.   Gastrointestinal: Positive for nausea, vomiting, abdominal pain and abdominal distention.  All other systems reviewed and are negative.  Allergies  Butalbital-apap-caffeine; Ferrlecit; Minoxidil; and Darvocet  Home Medications   Prior to Admission medications   Medication Sig Start Date End Date Taking? Authorizing Provider  ALPRAZolam [name]) 0.5 MG tablet Take 1 tablet (0.5 mg total) by mouth 2 (two) times daily as needed for anxiety. 11/04/15   [name], MD  amLODipine (NORVASC) 10 MG tablet Take 10 mg by mouth daily.    Historical Provider, MD  atorvastatin (LIPITOR) 40 MG tablet Take 1 tablet (40 mg total) by mouth daily at 6 PM. 09/02/14   Barton Dubois, MD  carvedilol (COREG) 12.5 MG tablet Take 1 tablet (12.5 mg total) by mouth 2 (two) times daily with a meal. 11/04/15   [name], MD  cinacalcet (SENSIPAR) 30 MG tablet Take 30 mg by mouth daily.    Historical Provider, MD  cyclobenzaprine (FLEXERIL) 10 MG tablet Take 1 tablet (10 mg total) by mouth 3 (three) times daily as needed for muscle spasms. 11/04/15   [name], MD  docusate sodium (COLACE) 100 MG capsule  Take 1 capsule (100 mg total) by mouth 2 (two) times daily. 11/19/14   [name], MD  doxercalciferol (HECTOROL) 4 MCG/2ML injection Inject 1.25 mLs (2.5 mcg total) into the vein Every Tuesday,Thursday,and Saturday with dialysis. 10/10/14   [name], DO  gabapentin (NEURONTIN) 100 MG capsule Take 1 capsule (100 mg total) by mouth 3 (three) times daily. 11/04/15 03/21/16  [name], MD  hydrALAZINE (APRESOLINE) 50 MG tablet Take 2 tablets (100 mg total) by mouth every 8 (eight) hours. 09/11/15   [name], MD  hydrocerin (EUCERIN) CREA Apply 1 application topically 2 (two) times daily. 02/04/15   [name], MD  HYDROcodone-acetaminophen (NORCO) 5-325 MG tablet Take 1-2 tablets by mouth every 6 (six) hours as needed. 12/31/15   Veryl Speak, MD  hydrOXYzine (ATARAX/VISTARIL) 10 MG tablet Take 1 tablet (10 mg total) by mouth 3 (three) times daily as needed for itching. 09/27/15   [name], MD  hyoscyamine (LEVSIN/SL) 0.125 MG SL tablet Place 1 tablet (0.125 mg total) under the tongue every 4 (four) hours as needed. Patient taking differently: Place 0.125 mg under the tongue every 4 (four) hours as needed for cramping.  11/17/15   April Palumbo, MD  ibuprofen (ADVIL,MOTRIN) 600 MG tablet Take 1 tablet (600 mg total) by mouth every 8 (eight) hours as needed. Patient taking differently: Take 600 mg by mouth every 8 (eight) hours as needed for moderate pain.  10/06/15   [name], MD  isosorbide mononitrate (IMDUR) 30 MG 24 hr tablet Take 3 tablets (90 mg total) by mouth daily. 09/11/15   [name], MD  levalbuterol [facility] HFA) 45 MCG/ACT inhaler Inhale 2 puffs into the lungs every 6 (six) hours as needed for wheezing or shortness of breath. 11/19/14   [name], MD  lisinopril (PRINIVIL,ZESTRIL) 20 MG tablet Take 1 tablet (20 mg total) by mouth daily. 02/04/15   [name], MD  metoCLOPramide (REGLAN) 10 MG tablet Take 1 tablet (10 mg total) by mouth every 6 (six)  hours as needed for nausea or vomiting. 12/17/15   Orpah Greek, MD  multivitamin (RENA-VIT) TABS tablet Take 1 tablet by mouth at bedtime. 02/04/15   [name], MD  omeprazole (PRILOSEC) 20 MG capsule Take 20 mg by mouth daily. 07/01/13   Historical Provider, MD  ondansetron (ZOFRAN ODT) 8 MG disintegrating tablet 21m ODT q4 hours prn nausea 12/31/15   DVeryl Speak MD  ondansetron (ZOFRAN) 4 MG tablet Take 1 tablet (4 mg total) by mouth every 6 (six) hours as needed for nausea or vomiting. 12/17/15   COrpah Greek MD  oxyCODONE-acetaminophen (PERCOCET/ROXICET) 5-325 MG tablet Take 1-2 tablets by mouth every 6 (six) hours as needed for severe  pain. 11/13/15   Carlisle Cater, PA-C  predniSONE (DELTASONE) 5 MG tablet Take 5 mg by mouth daily with breakfast.    Historical Provider, MD  sevelamer carbonate (RENVELA) 800 MG tablet Take 800-1,600 mg by mouth 3 (three) times daily with meals. 2 tabs three times daily with meals, and 1 tablet with snacks    Historical Provider, MD  traMADol (ULTRAM) 50 MG tablet Take 1 tablet (50 mg total) by mouth every 6 (six) hours as needed for moderate pain. 11/04/15   [name], MD  warfarin (COUMADIN) 6 MG tablet Take 1.5 tablets (9 mg total) by mouth daily at 6 PM. Take daily until adjusted per MD 09/12/15   [name], MD   BP 151/110 mmHg  Pulse 80  Temp(Src) 97.9 F (36.6 C) (Oral)  Resp 21  SpO2 99% Physical Exam  Constitutional: He is oriented to person, place, and time. He appears well-developed and well-nourished.  Appears to be in pain   HENT:  Head: Normocephalic and atraumatic.  Eyes: EOM are normal. Pupils are equal, round, and reactive to light.  Neck: Normal range of motion. Neck supple. No JVD present.  Cardiovascular: Normal rate, regular rhythm, normal heart sounds and intact distal pulses.   No murmur heard. Pulmonary/Chest: Effort normal and breath sounds normal. He has no wheezes. He has no rales. He exhibits no  tenderness.  Dialysis access catheter in right subclavian   Abdominal: Soft. He exhibits no distension and no mass. There is tenderness. There is no rebound and no guarding.  Moderate TTP in right side of abdomen Transplanted kidney palpable RLQ with tenderness Bowel sounds decreased  Musculoskeletal: Normal range of motion. He exhibits no edema.  Lymphadenopathy:    He has no cervical adenopathy.  Neurological: He is alert and oriented to person, place, and time. No cranial nerve deficit. He exhibits normal muscle tone. Coordination normal.  Skin: Skin is warm and dry. No rash noted.  Psychiatric: He has a normal mood and affect. His behavior is normal. Judgment and thought content normal.  Nursing note and vitals reviewed.   ED Course  Procedures (including critical care time) DIAGNOSTIC STUDIES: Oxygen Saturation is 99% on RA,  normal by my interpretation.    COORDINATION OF CARE: 12:52 AM Discussed treatment plan which includes lab work, CT A/P with contrast, pain medication, antiemetic medication, and IVF with pt at bedside and pt agreed to plan.  Labs Review Results for orders placed or performed during the hospital encounter of 01/01/16  Comprehensive metabolic panel  Result Value Ref Range   Sodium 134 (L) 135 - 145 mmol/L   Potassium 5.4 (H) 3.5 - 5.1 mmol/L   Chloride 101 101 - 111 mmol/L   CO2 23 22 - 32 mmol/L   Glucose, Bld 77 65 - 99 mg/dL   BUN 37 (H) 6 - 20 mg/dL   Creatinine, Ser 9.57 (H) 0.61 - 1.24 mg/dL   Calcium 8.6 (L) 8.9 - 10.3 mg/dL   Total Protein 7.4 6.5 - 8.1 g/dL   Albumin 2.7 (L) 3.5 - 5.0 g/dL   AST 46 (H) 15 - 41 U/L   ALT 12 (L) 17 - 63 U/L   Alkaline Phosphatase 89 38 - 126 U/L   Total Bilirubin 0.6 0.3 - 1.2 mg/dL   GFR calc non Af Amer 6 (L) >60 mL/min   GFR calc Af Amer 6 (L) >60 mL/min   Anion gap 10 5 - 15  Lipase, blood  Result  Value Ref Range   Lipase 17 11 - 51 U/L  CBC with Differential  Result Value Ref Range   WBC 5.4 4.0 -  10.5 K/uL   RBC 2.70 (L) 4.22 - 5.81 MIL/uL   Hemoglobin 7.2 (L) 13.0 - 17.0 g/dL   HCT 23.1 (L) 39.0 - 52.0 %   MCV 85.6 78.0 - 100.0 fL   MCH 26.7 26.0 - 34.0 pg   MCHC 31.2 30.0 - 36.0 g/dL   RDW 16.1 (H) 11.5 - 15.5 %   Platelets 158 150 - 400 K/uL   Neutrophils Relative % 60 %   Neutro Abs 3.2 1.7 - 7.7 K/uL   Lymphocytes Relative 21 %   Lymphs Abs 1.1 0.7 - 4.0 K/uL   Monocytes Relative 11 %   Monocytes Absolute 0.6 0.1 - 1.0 K/uL   Eosinophils Relative 8 %   Eosinophils Absolute 0.5 0.0 - 0.7 K/uL   Basophils Relative 0 %   Basophils Absolute 0.0 0.0 - 0.1 K/uL   Imaging Review US Scrotum  12/31/2015  CLINICAL DATA:  Scrotal pain. Abdominal pain. Dialysis today. History of chronic inflammation of the scrotum. EXAM: ULTRASOUND OF SCROTUM TECHNIQUE: Complete ultrasound examination of the testicles, epididymis, and other scrotal structures was performed. COMPARISON:  CT of 10/30/2015.  06/05/2015 scrotal ultrasound. FINDINGS: Right testicle Measurements: 4.2 x 2.2 x 3.1 cm. No mass or microlithiasis visualized. Left testicle Measurements: 4.2 x 1.9 x 3.1 cm. No mass or microlithiasis visualized. Right epididymis:  Normal in size and appearance. Left epididymis:  Normal in size and appearance. Hydrocele:  None visualized. Varicocele:  None visualized. IMPRESSION: No acute process or explanation for scrotal pain. Electronically Signed   By: Abigail Miyamoto M.D.   On: 12/31/2015 09:00   Ct Abdomen Pelvis W Contrast  01/01/2016  CLINICAL DATA:  Onset of epigastric abdominal pain, nausea and vomiting. Initial encounter. EXAM: CT ABDOMEN AND PELVIS WITH CONTRAST TECHNIQUE: Multidetector CT imaging of the abdomen and pelvis was performed using the standard protocol following bolus administration of intravenous contrast. CONTRAST:  175m ISOVUE-300 IOPAMIDOL (ISOVUE-300) INJECTION 61% COMPARISON:  CT of the abdomen and pelvis performed 10/30/2015 FINDINGS: The visualized lung bases are clear.  Evaluation is suboptimal due to significantly delayed contrast enhancement on this study. Trace ascites is noted tracking about the liver and stomach. The liver and spleen are unremarkable in appearance. Trace fluid about the gallbladder is nonspecific in the presence of ascites. The pancreas and adrenal glands are unremarkable. There is a heterogeneous mass arising at the left kidney, measuring 3.7 cm, again concerning for renal cell carcinoma, though it has decreased mildly in size since the prior study. Severe bilateral native renal atrophy is noted, with mild perinephric stranding. The patient's left iliac transplant kidney is grossly unremarkable in appearance, aside from scattered vascular calcification, though difficult to fully assess. The small bowel is unremarkable in appearance. The stomach is within normal limits. No acute vascular abnormalities are seen. Diffuse calcification is seen along the abdominal aorta and its branches. The appendix is not definitely characterized; there is no evidence for appendicitis. The colon is grossly unremarkable in appearance. The bladder is decompressed and not well assessed. The prostate remains normal in size. No inguinal lymphadenopathy is seen. No acute osseous abnormalities are identified. IMPRESSION: 1. Trace ascites noted tracking about the liver and stomach. 2. 3.7 cm heterogeneous left renal mass at the atrophic native left kidney has decreased mildly in size since the prior study, but remains concerning for  renal cell carcinoma. 3. Severe bilateral native renal atrophy. Left iliac transplant kidney is grossly unremarkable in appearance, though not well assessed. 4. Diffuse calcification along the abdominal aorta and its branches. Electronically Signed   By: Garald Balding M.D.   On: 01/01/2016 02:27   I have personally reviewed and evaluated these images and lab results as part of my medical decision-making.   MDM   Final diagnoses:  Abdominal pain,  unspecified abdominal location  End-stage renal disease on hemodialysis ([place])  Left renal mass  Anemia of renal disease    Patient with chronic abdominal pain and chronic scrotal pain seen by me last night and had negative scrotal ultrasound that had been discharged. Abdominal pain tonight is localized to the right side. In past, pain is been centered around transplanted kidney. He had been discharged this morning with prescription for oxycodone but he obtained no relief with him taking 3 pills at a time. Will check CT of abdomen and pelvis to make sure there is no acute process present. In the meantime, he is given gentle IV hydration as well as IV hydromorphone. Records reviewed and last CT scan was 2 months ago which was a renal protocol study. He does have a renal mass which apparently is being worked up as an outpatient.  CT is unremarkable. Left renal masses still present. He claims little relief from initial dose of hydromorphone and is given a second dose with moderate improvement. Blood pressure has come down to 145/110. Patient is advised of CT findings and told to continue taking his pain medications at home. He states he is scheduled for surgery on June 26 and she is to make sure to keep that appointment.  I personally performed the services described in this documentation, which was scribed in my presence. The recorded information has been reviewed and is accurate.      Delora Fuel, MD 81/01/75 1025

## 2016-01-01 NOTE — ED Notes (Signed)
Pt ambulated to the bathroom without difficulty.

## 2016-01-01 NOTE — Discharge Instructions (Signed)
Your CT scan does not show any acute process. You will need to have your left kidney removed to evaluate the mass that is in there. This will need to be arranged through your providers. Continue taking your current medications. If pain continues to be a problem, you may need to consider referral to a pain management center.   Abdominal Pain, Adult Many things can cause abdominal pain. Usually, abdominal pain is not caused by a disease and will improve without treatment. It can often be observed and treated at home. Your health care provider will do a physical exam and possibly order blood tests and X-rays to help determine the seriousness of your pain. However, in many cases, more time must pass before a clear cause of the pain can be found. Before that point, your health care provider may not know if you need more testing or further treatment. HOME CARE INSTRUCTIONS Monitor your abdominal pain for any changes. The following actions may help to alleviate any discomfort you are experiencing:  Only take over-the-counter or prescription medicines as directed by your health care provider.  Do not take laxatives unless directed to do so by your health care provider.  Try a clear liquid diet (broth, tea, or water) as directed by your health care provider. Slowly move to a bland diet as tolerated. SEEK MEDICAL CARE IF:  You have unexplained abdominal pain.  You have abdominal pain associated with nausea or diarrhea.  You have pain when you urinate or have a bowel movement.  You experience abdominal pain that wakes you in the night.  You have abdominal pain that is worsened or improved by eating food.  You have abdominal pain that is worsened with eating fatty foods.  You have a fever. SEEK IMMEDIATE MEDICAL CARE IF:  Your pain does not go away within 2 hours.  You keep throwing up (vomiting).  Your pain is felt only in portions of the abdomen, such as the right side or the left lower  portion of the abdomen.  You pass bloody or black tarry stools. MAKE SURE YOU:  Understand these instructions.  Will watch your condition.  Will get help right away if you are not doing well or get worse.   This information is not intended to replace advice given to you by your health care provider. Make sure you discuss any questions you have with your health care provider.   Document Released: 04/17/2005 Document Revised: 03/29/2015 Document Reviewed: 03/17/2013 Elsevier Interactive Patient Education [employer].

## 2016-01-02 DIAGNOSIS — I509 Heart failure, unspecified: Secondary | ICD-10-CM | POA: Diagnosis not present

## 2016-01-02 DIAGNOSIS — N2889 Other specified disorders of kidney and ureter: Secondary | ICD-10-CM | POA: Diagnosis not present

## 2016-01-02 DIAGNOSIS — I444 Left anterior fascicular block: Secondary | ICD-10-CM | POA: Diagnosis not present

## 2016-01-02 DIAGNOSIS — R1084 Generalized abdominal pain: Secondary | ICD-10-CM | POA: Diagnosis not present

## 2016-01-02 DIAGNOSIS — I4581 Long QT syndrome: Secondary | ICD-10-CM | POA: Diagnosis not present

## 2016-01-02 DIAGNOSIS — N186 End stage renal disease: Secondary | ICD-10-CM | POA: Diagnosis not present

## 2016-01-02 DIAGNOSIS — I132 Hypertensive heart and chronic kidney disease with heart failure and with stage 5 chronic kidney disease, or end stage renal disease: Secondary | ICD-10-CM | POA: Diagnosis not present

## 2016-01-02 DIAGNOSIS — Z992 Dependence on renal dialysis: Secondary | ICD-10-CM | POA: Diagnosis not present

## 2016-01-04 DIAGNOSIS — N2581 Secondary hyperparathyroidism of renal origin: Secondary | ICD-10-CM | POA: Diagnosis not present

## 2016-01-04 DIAGNOSIS — D631 Anemia in chronic kidney disease: Secondary | ICD-10-CM | POA: Diagnosis not present

## 2016-01-04 DIAGNOSIS — N186 End stage renal disease: Secondary | ICD-10-CM | POA: Diagnosis not present

## 2016-01-04 DIAGNOSIS — Z7901 Long term (current) use of anticoagulants: Secondary | ICD-10-CM | POA: Diagnosis not present

## 2016-01-04 DIAGNOSIS — Z418 Encounter for other procedures for purposes other than remedying health state: Secondary | ICD-10-CM | POA: Diagnosis not present

## 2016-01-05 ENCOUNTER — Encounter (HOSPITAL_COMMUNITY): Payer: Self-pay | Admitting: Emergency Medicine

## 2016-01-05 ENCOUNTER — Emergency Department (HOSPITAL_COMMUNITY)
Admission: EM | Admit: 2016-01-05 | Discharge: 2016-01-05 | Discharge status: Against Medical Advice | Payer: Medicare Other | Source: Home / Self Care | Attending: Emergency Medicine | Admitting: Emergency Medicine

## 2016-01-05 DIAGNOSIS — I120 Hypertensive chronic kidney disease with stage 5 chronic kidney disease or end stage renal disease: Secondary | ICD-10-CM | POA: Diagnosis not present

## 2016-01-05 DIAGNOSIS — R55 Syncope and collapse: Secondary | ICD-10-CM | POA: Diagnosis not present

## 2016-01-05 DIAGNOSIS — G4733 Obstructive sleep apnea (adult) (pediatric): Secondary | ICD-10-CM | POA: Diagnosis not present

## 2016-01-05 DIAGNOSIS — N5082 Scrotal pain: Secondary | ICD-10-CM

## 2016-01-05 DIAGNOSIS — I509 Heart failure, unspecified: Secondary | ICD-10-CM

## 2016-01-05 DIAGNOSIS — Z791 Long term (current) use of non-steroidal anti-inflammatories (NSAID): Secondary | ICD-10-CM | POA: Insufficient documentation

## 2016-01-05 DIAGNOSIS — N186 End stage renal disease: Secondary | ICD-10-CM

## 2016-01-05 DIAGNOSIS — Z765 Malingerer [conscious simulation]: Secondary | ICD-10-CM

## 2016-01-05 DIAGNOSIS — E1122 Type 2 diabetes mellitus with diabetic chronic kidney disease: Secondary | ICD-10-CM | POA: Diagnosis not present

## 2016-01-05 DIAGNOSIS — Z7289 Other problems related to lifestyle: Secondary | ICD-10-CM | POA: Insufficient documentation

## 2016-01-05 DIAGNOSIS — Z79891 Long term (current) use of opiate analgesic: Secondary | ICD-10-CM

## 2016-01-05 DIAGNOSIS — Z7901 Long term (current) use of anticoagulants: Secondary | ICD-10-CM | POA: Insufficient documentation

## 2016-01-05 DIAGNOSIS — Z79899 Other long term (current) drug therapy: Secondary | ICD-10-CM

## 2016-01-05 DIAGNOSIS — R112 Nausea with vomiting, unspecified: Secondary | ICD-10-CM | POA: Diagnosis not present

## 2016-01-05 DIAGNOSIS — Z87891 Personal history of nicotine dependence: Secondary | ICD-10-CM

## 2016-01-05 DIAGNOSIS — Z7952 Long term (current) use of systemic steroids: Secondary | ICD-10-CM | POA: Insufficient documentation

## 2016-01-05 DIAGNOSIS — I132 Hypertensive heart and chronic kidney disease with heart failure and with stage 5 chronic kidney disease, or end stage renal disease: Secondary | ICD-10-CM

## 2016-01-05 DIAGNOSIS — E119 Type 2 diabetes mellitus without complications: Secondary | ICD-10-CM | POA: Insufficient documentation

## 2016-01-05 DIAGNOSIS — I5042 Chronic combined systolic (congestive) and diastolic (congestive) heart failure: Secondary | ICD-10-CM | POA: Diagnosis not present

## 2016-01-05 DIAGNOSIS — R109 Unspecified abdominal pain: Secondary | ICD-10-CM | POA: Diagnosis not present

## 2016-01-05 DIAGNOSIS — I248 Other forms of acute ischemic heart disease: Secondary | ICD-10-CM | POA: Diagnosis not present

## 2016-01-05 DIAGNOSIS — K219 Gastro-esophageal reflux disease without esophagitis: Secondary | ICD-10-CM | POA: Diagnosis not present

## 2016-01-05 DIAGNOSIS — T8612 Kidney transplant failure: Secondary | ICD-10-CM | POA: Diagnosis not present

## 2016-01-05 MED ORDER — PROMETHAZINE HCL 25 MG/ML IJ SOLN
25.0000 mg | Freq: Once | INTRAMUSCULAR | Status: DC
  Filled 2016-01-05: qty 1

## 2016-01-05 NOTE — ED Notes (Signed)
Pt refusing anti-nausea medication; pt insisting to walk out AMA; pt refusing to sign AMA form; pt stating "this doctor doesn't treat me right, Im gonna complain on him"; pt ambulatory to lobby; steady gait noted

## 2016-01-05 NOTE — ED Provider Notes (Signed)
CSN: [id number]     Arrival date & time 01/05/16  0114 History   First MD Initiated Contact with Patient 01/05/16 [phone]     Chief Complaint  Patient presents with  . Testicle Pain     (Consider location/radiation/quality/duration/timing/severity/associated sxs/prior Treatment) HPI Comments: Patient is a 50 year old male with past medical history of end-stage renal disease on hemodialysis. He presents for evaluation of scrotal pain. This is been ongoing for quite some time. He was seen here several days ago and underwent a CAT scan and ultrasound, both of which were unremarkable. He also went to [facility] 2 days ago and underwent another CAT scan and ultrasound, both of these were unremarkable. He reports he was dialyzed today. He denies any difficulty breathing, chest pain, or fevers.  Patient is a 50 y.o. male presenting with testicular pain. The history is provided by the patient.  Testicle Pain This is a chronic problem. The problem occurs constantly. The problem has not changed since onset.Pertinent negatives include no abdominal pain. Nothing aggravates the symptoms. Nothing relieves the symptoms. He has tried nothing for the symptoms. The treatment provided no relief.    Past Medical History  Diagnosis Date  . Hypertension   . Depression   . Complication of anesthesia     itching, sore throat  . Shortness of breath   . Anxiety   . CHF (congestive heart failure) ([place])   . Anemia   . Dialysis patient ([place])   . Type II diabetes mellitus (HCC)     No history per patient, but remains under history as A1c would not be accurate given on dialysis  . ESRD (end stage renal disease) ([place])     due to HTN per patient, followed at [facility], s/p failed kidney transplant - dialysis Tue, Th, Sat  . Renal insufficiency    Past Surgical History  Procedure Laterality Date  . Kidney receipient  2006    failed and started HD in March 2014  . Capd insertion    . Capd removal    . Left heart  catheterization with coronary angiogram N/A 09/02/2014    Procedure: LEFT HEART CATHETERIZATION WITH CORONARY ANGIOGRAM;  Surgeon: [name], MD;  Location: [facility] CATH LAB;  Service: Cardiovascular;  Laterality: N/A;  . Inguinal hernia repair Right 02/14/2015    Procedure: REPAIR INCARCERATED RIGHT INGUINAL HERNIA;  Surgeon: [name], MD;  Location: [place];  Service: General;  Laterality: Right;  . Insertion of dialysis catheter Right 09/23/2015    Procedure: exchange of Right internal Dialysis Catheter.;  Surgeon: [name], MD;  Location: [facility] OR;  Service: Vascular;  Laterality: Right;   Family History  Problem Relation Age of Onset  . Hypertension Other    Social History  Substance Use Topics  . Smoking status: Former Smoker -- 0.00 packs/day for 1 years    Types: Cigarettes  . Smokeless tobacco: Never Used     Comment: quit Jan 2014  . Alcohol Use: No    Review of Systems  Gastrointestinal: Negative for abdominal pain.  Genitourinary: Positive for testicular pain.  All other systems reviewed and are negative.     Allergies  Butalbital-apap-caffeine; Ferrlecit; Minoxidil; and Darvocet  Home Medications   Prior to Admission medications   Medication Sig Start Date End Date Taking? Authorizing Provider  ALPRAZolam [name]) 0.5 MG tablet Take 1 tablet (0.5 mg total) by mouth 2 (two) times daily as needed for anxiety. 11/04/15   [name], MD  amLODipine (  NORVASC) 10 MG tablet Take 10 mg by mouth daily.    Historical Provider, MD  atorvastatin (LIPITOR) 40 MG tablet Take 1 tablet (40 mg total) by mouth daily at 6 PM. 09/02/14   Barton Dubois, MD  carvedilol (COREG) 12.5 MG tablet Take 1 tablet (12.5 mg total) by mouth 2 (two) times daily with a meal. 11/04/15   [name], MD  cinacalcet (SENSIPAR) 30 MG tablet Take 30 mg by mouth daily.    Historical Provider, MD  cyclobenzaprine (FLEXERIL) 10 MG tablet Take 1 tablet (10 mg total) by mouth 3 (three) times daily as  needed for muscle spasms. 11/04/15   [name], MD  docusate sodium (COLACE) 100 MG capsule Take 1 capsule (100 mg total) by mouth 2 (two) times daily. 11/19/14   [name], MD  doxercalciferol (HECTOROL) 4 MCG/2ML injection Inject 1.25 mLs (2.5 mcg total) into the vein Every Tuesday,Thursday,and Saturday with dialysis. 10/10/14   [name], DO  gabapentin (NEURONTIN) 100 MG capsule Take 1 capsule (100 mg total) by mouth 3 (three) times daily. 11/04/15 03/21/16  [name], MD  hydrALAZINE (APRESOLINE) 50 MG tablet Take 2 tablets (100 mg total) by mouth every 8 (eight) hours. 09/11/15   [name], MD  hydrocerin (EUCERIN) CREA Apply 1 application topically 2 (two) times daily. 02/04/15   [name], MD  HYDROcodone-acetaminophen (NORCO) 5-325 MG tablet Take 1-2 tablets by mouth every 6 (six) hours as needed. 12/31/15   Veryl Speak, MD  hydrOXYzine (ATARAX/VISTARIL) 10 MG tablet Take 1 tablet (10 mg total) by mouth 3 (three) times daily as needed for itching. 09/27/15   [name], MD  hyoscyamine (LEVSIN/SL) 0.125 MG SL tablet Place 1 tablet (0.125 mg total) under the tongue every 4 (four) hours as needed. Patient taking differently: Place 0.125 mg under the tongue every 4 (four) hours as needed for cramping.  11/17/15   April Palumbo, MD  ibuprofen (ADVIL,MOTRIN) 600 MG tablet Take 1 tablet (600 mg total) by mouth every 8 (eight) hours as needed. Patient taking differently: Take 600 mg by mouth every 8 (eight) hours as needed for moderate pain.  10/06/15   [name], MD  isosorbide mononitrate (IMDUR) 30 MG 24 hr tablet Take 3 tablets (90 mg total) by mouth daily. 09/11/15   [name], MD  levalbuterol [facility] HFA) 45 MCG/ACT inhaler Inhale 2 puffs into the lungs every 6 (six) hours as needed for wheezing or shortness of breath. 11/19/14   [name], MD  lisinopril (PRINIVIL,ZESTRIL) 20 MG tablet Take 1 tablet (20 mg total) by mouth daily. 02/04/15   [name], MD  metoCLOPramide (REGLAN) 10 MG tablet Take 1 tablet (10 mg total) by mouth every 6 (six) hours as needed for nausea or vomiting. 12/17/15   Orpah Greek, MD  multivitamin (RENA-VIT) TABS tablet Take 1 tablet by mouth at bedtime. 02/04/15   [name], MD  omeprazole (PRILOSEC) 20 MG capsule Take 20 mg by mouth daily. 07/01/13   Historical Provider, MD  ondansetron (ZOFRAN ODT) 8 MG disintegrating tablet 43m ODT q4 hours prn nausea 12/31/15   DVeryl Speak MD  ondansetron (ZOFRAN) 4 MG tablet Take 1 tablet (4 mg total) by mouth every 6 (six) hours as needed for nausea or vomiting. 12/17/15   COrpah Greek MD  oxyCODONE-acetaminophen (PERCOCET/ROXICET) 5-325 MG tablet Take 1-2 tablets by mouth every 6 (six) hours as needed for severe pain.  11/13/15   Carlisle Cater, PA-C  predniSONE (DELTASONE) 5 MG tablet Take 5 mg by mouth daily with breakfast.    Historical Provider, MD  sevelamer carbonate (RENVELA) 800 MG tablet Take 800-1,600 mg by mouth 3 (three) times daily with meals. 2 tabs three times daily with meals, and 1 tablet with snacks    Historical Provider, MD  traMADol (ULTRAM) 50 MG tablet Take 1 tablet (50 mg total) by mouth every 6 (six) hours as needed for moderate pain. 11/04/15   [name], MD  warfarin (COUMADIN) 6 MG tablet Take 1.5 tablets (9 mg total) by mouth daily at 6 PM. Take daily until adjusted per MD 09/12/15   [name], MD   BP 181/128 mmHg  Pulse 75  Temp(Src) 98.5 F (36.9 C) (Oral)  Resp 15  SpO2 99% Physical Exam  Constitutional: He is oriented to person, place, and time. He appears well-developed and well-nourished. No distress.  HENT:  Head: Normocephalic and atraumatic.  Mouth/Throat: Oropharynx is clear and moist.  Neck: Normal range of motion. Neck supple.  Cardiovascular: Normal rate and regular rhythm.  Exam reveals no friction rub.   No murmur heard. Pulmonary/Chest: Effort normal and breath sounds normal. No respiratory  distress. He has no wheezes. He has no rales.  Abdominal: Soft. Bowel sounds are normal. He exhibits no distension. There is no tenderness.  Genitourinary: Penis normal. No penile tenderness.  The penis and testicles appear normal. He has tenderness to the inferior aspect of the scrotum, however there is no crepitus, swelling, warmth, or drainage. Both testicles are freely mobile within the scrotal sac.  Musculoskeletal: Normal range of motion. He exhibits no edema.  Neurological: He is alert and oriented to person, place, and time. Coordination normal.  Skin: Skin is warm and dry. He is not diaphoretic.  Nursing note and vitals reviewed.   ED Course  Procedures (including critical care time) Labs Review Labs Reviewed  URINALYSIS, ROUTINE W REFLEX MICROSCOPIC (NOT AT [facility])  CBC WITH DIFFERENTIAL/PLATELET  COMPREHENSIVE METABOLIC PANEL    Imaging Review No results found. I have personally reviewed and evaluated these images and lab results as part of my medical decision-making.    MDM   Final diagnoses:  None   This patient has now come to the Emergency Department 31 times in the past 6 months. He has chronic pain and is noncompliant with treatment regimens and recommendations.   His physical examination reveals no abnormalities in the patient appears in no discomfort. His scrotum does not appear to be swollen, infected, and there is no evidence for a torsion. Upon reviewing his medical record from here and [place], he is undergone 3 CAT scans and 2 ultrasounds and multiple laboratory studies in the past 3 days. He has also received 35 narcotic pills in the past 4 days. I had a conversation with the patient and informed him that I would not be ordering further imaging studies and that he would have to see his primary Dr. for additional pain medication. The patient then became irate, profane, and verbally abusive, and made quite a scene in the hallway.   He informed me he was not  here for pain medication but for something to help him with throwing up. I then informed him I would give him an injection of Zofran for the ED and a prescription for Phenergan which he could use at home. He refused these, then left the emergency department cursing and carrying on the whole  way to the exit.    Veryl Speak, MD 01/05/16 [phone]

## 2016-01-05 NOTE — ED Notes (Signed)
Pt. reports right testicular pain for 1 week , denies injury , no dysuria or hematuria . Pt. added nausea and emesis today . No fever or chills.

## 2016-01-06 DIAGNOSIS — S300XXA Contusion of lower back and pelvis, initial encounter: Secondary | ICD-10-CM | POA: Diagnosis not present

## 2016-01-06 DIAGNOSIS — S3992XA Unspecified injury of lower back, initial encounter: Secondary | ICD-10-CM | POA: Diagnosis not present

## 2016-01-06 DIAGNOSIS — N186 End stage renal disease: Secondary | ICD-10-CM | POA: Diagnosis not present

## 2016-01-06 DIAGNOSIS — I10 Essential (primary) hypertension: Secondary | ICD-10-CM | POA: Diagnosis not present

## 2016-01-06 DIAGNOSIS — Z885 Allergy status to narcotic agent status: Secondary | ICD-10-CM | POA: Diagnosis not present

## 2016-01-06 DIAGNOSIS — Z940 Kidney transplant status: Secondary | ICD-10-CM | POA: Diagnosis not present

## 2016-01-06 DIAGNOSIS — Z7901 Long term (current) use of anticoagulants: Secondary | ICD-10-CM | POA: Diagnosis not present

## 2016-01-06 DIAGNOSIS — N289 Disorder of kidney and ureter, unspecified: Secondary | ICD-10-CM | POA: Diagnosis not present

## 2016-01-06 DIAGNOSIS — R296 Repeated falls: Secondary | ICD-10-CM | POA: Diagnosis not present

## 2016-01-06 DIAGNOSIS — Z888 Allergy status to other drugs, medicaments and biological substances status: Secondary | ICD-10-CM | POA: Diagnosis not present

## 2016-01-06 DIAGNOSIS — Z79899 Other long term (current) drug therapy: Secondary | ICD-10-CM | POA: Diagnosis not present

## 2016-01-06 DIAGNOSIS — M546 Pain in thoracic spine: Secondary | ICD-10-CM | POA: Diagnosis not present

## 2016-01-06 DIAGNOSIS — R0602 Shortness of breath: Secondary | ICD-10-CM | POA: Diagnosis not present

## 2016-01-06 DIAGNOSIS — M545 Low back pain: Secondary | ICD-10-CM | POA: Diagnosis not present

## 2016-01-06 DIAGNOSIS — S0990XA Unspecified injury of head, initial encounter: Secondary | ICD-10-CM | POA: Diagnosis not present

## 2016-01-06 DIAGNOSIS — R55 Syncope and collapse: Secondary | ICD-10-CM | POA: Diagnosis not present

## 2016-01-06 DIAGNOSIS — R112 Nausea with vomiting, unspecified: Secondary | ICD-10-CM | POA: Diagnosis not present

## 2016-01-06 DIAGNOSIS — I509 Heart failure, unspecified: Secondary | ICD-10-CM | POA: Diagnosis not present

## 2016-01-06 DIAGNOSIS — I132 Hypertensive heart and chronic kidney disease with heart failure and with stage 5 chronic kidney disease, or end stage renal disease: Secondary | ICD-10-CM | POA: Diagnosis not present

## 2016-01-06 DIAGNOSIS — I120 Hypertensive chronic kidney disease with stage 5 chronic kidney disease or end stage renal disease: Secondary | ICD-10-CM | POA: Diagnosis not present

## 2016-01-06 DIAGNOSIS — R1013 Epigastric pain: Secondary | ICD-10-CM | POA: Diagnosis not present

## 2016-01-06 DIAGNOSIS — Z7952 Long term (current) use of systemic steroids: Secondary | ICD-10-CM | POA: Diagnosis not present

## 2016-01-06 DIAGNOSIS — G43A0 Cyclical vomiting, not intractable: Secondary | ICD-10-CM | POA: Diagnosis not present

## 2016-01-06 DIAGNOSIS — R109 Unspecified abdominal pain: Secondary | ICD-10-CM | POA: Diagnosis not present

## 2016-01-06 DIAGNOSIS — G8929 Other chronic pain: Secondary | ICD-10-CM | POA: Diagnosis not present

## 2016-01-06 DIAGNOSIS — Z992 Dependence on renal dialysis: Secondary | ICD-10-CM | POA: Diagnosis not present

## 2016-01-06 DIAGNOSIS — Z0000 Encounter for general adult medical examination without abnormal findings: Secondary | ICD-10-CM | POA: Diagnosis not present

## 2016-01-06 DIAGNOSIS — S20229A Contusion of unspecified back wall of thorax, initial encounter: Secondary | ICD-10-CM | POA: Diagnosis not present

## 2016-01-07 ENCOUNTER — Other Ambulatory Visit: Payer: Self-pay

## 2016-01-07 ENCOUNTER — Encounter (HOSPITAL_COMMUNITY): Payer: Self-pay | Admitting: Emergency Medicine

## 2016-01-07 ENCOUNTER — Inpatient Hospital Stay (HOSPITAL_COMMUNITY)
Admission: EM | Admit: 2016-01-07 | Discharge: 2016-01-10 | DRG: 291 | Disposition: A | Discharge status: Home / Self Care | Payer: Medicare Other | Attending: Family Medicine | Admitting: Family Medicine

## 2016-01-07 ENCOUNTER — Emergency Department (HOSPITAL_COMMUNITY): Payer: Medicare Other

## 2016-01-07 DIAGNOSIS — R001 Bradycardia, unspecified: Secondary | ICD-10-CM | POA: Diagnosis not present

## 2016-01-07 DIAGNOSIS — T8611 Kidney transplant rejection: Secondary | ICD-10-CM | POA: Diagnosis not present

## 2016-01-07 DIAGNOSIS — N186 End stage renal disease: Secondary | ICD-10-CM | POA: Diagnosis not present

## 2016-01-07 DIAGNOSIS — K2970 Gastritis, unspecified, without bleeding: Secondary | ICD-10-CM | POA: Diagnosis not present

## 2016-01-07 DIAGNOSIS — I442 Atrioventricular block, complete: Secondary | ICD-10-CM | POA: Diagnosis not present

## 2016-01-07 DIAGNOSIS — E875 Hyperkalemia: Secondary | ICD-10-CM | POA: Diagnosis not present

## 2016-01-07 DIAGNOSIS — Z856 Personal history of leukemia: Secondary | ICD-10-CM | POA: Diagnosis not present

## 2016-01-07 DIAGNOSIS — N2889 Other specified disorders of kidney and ureter: Secondary | ICD-10-CM | POA: Diagnosis not present

## 2016-01-07 DIAGNOSIS — Z7901 Long term (current) use of anticoagulants: Secondary | ICD-10-CM | POA: Diagnosis not present

## 2016-01-07 DIAGNOSIS — Z87891 Personal history of nicotine dependence: Secondary | ICD-10-CM

## 2016-01-07 DIAGNOSIS — I132 Hypertensive heart and chronic kidney disease with heart failure and with stage 5 chronic kidney disease, or end stage renal disease: Secondary | ICD-10-CM | POA: Diagnosis not present

## 2016-01-07 DIAGNOSIS — Z992 Dependence on renal dialysis: Secondary | ICD-10-CM

## 2016-01-07 DIAGNOSIS — Z7952 Long term (current) use of systemic steroids: Secondary | ICD-10-CM | POA: Diagnosis not present

## 2016-01-07 DIAGNOSIS — R7989 Other specified abnormal findings of blood chemistry: Secondary | ICD-10-CM | POA: Diagnosis not present

## 2016-01-07 DIAGNOSIS — Z9114 Patient's other noncompliance with medication regimen: Secondary | ICD-10-CM | POA: Diagnosis not present

## 2016-01-07 DIAGNOSIS — E1122 Type 2 diabetes mellitus with diabetic chronic kidney disease: Secondary | ICD-10-CM | POA: Diagnosis not present

## 2016-01-07 DIAGNOSIS — I5042 Chronic combined systolic (congestive) and diastolic (congestive) heart failure: Secondary | ICD-10-CM | POA: Diagnosis not present

## 2016-01-07 DIAGNOSIS — F329 Major depressive disorder, single episode, unspecified: Secondary | ICD-10-CM | POA: Diagnosis present

## 2016-01-07 DIAGNOSIS — G8929 Other chronic pain: Secondary | ICD-10-CM

## 2016-01-07 DIAGNOSIS — R110 Nausea: Secondary | ICD-10-CM | POA: Diagnosis not present

## 2016-01-07 DIAGNOSIS — R0602 Shortness of breath: Secondary | ICD-10-CM | POA: Diagnosis not present

## 2016-01-07 DIAGNOSIS — E871 Hypo-osmolality and hyponatremia: Secondary | ICD-10-CM | POA: Diagnosis not present

## 2016-01-07 DIAGNOSIS — Z940 Kidney transplant status: Secondary | ICD-10-CM | POA: Diagnosis not present

## 2016-01-07 DIAGNOSIS — S20229A Contusion of unspecified back wall of thorax, initial encounter: Secondary | ICD-10-CM | POA: Diagnosis not present

## 2016-01-07 DIAGNOSIS — Z86718 Personal history of other venous thrombosis and embolism: Secondary | ICD-10-CM | POA: Diagnosis not present

## 2016-01-07 DIAGNOSIS — E785 Hyperlipidemia, unspecified: Secondary | ICD-10-CM | POA: Diagnosis not present

## 2016-01-07 DIAGNOSIS — Z86711 Personal history of pulmonary embolism: Secondary | ICD-10-CM

## 2016-01-07 DIAGNOSIS — Z888 Allergy status to other drugs, medicaments and biological substances status: Secondary | ICD-10-CM | POA: Diagnosis not present

## 2016-01-07 DIAGNOSIS — R1013 Epigastric pain: Secondary | ICD-10-CM | POA: Diagnosis not present

## 2016-01-07 DIAGNOSIS — R55 Syncope and collapse: Secondary | ICD-10-CM | POA: Diagnosis not present

## 2016-01-07 DIAGNOSIS — R112 Nausea with vomiting, unspecified: Secondary | ICD-10-CM | POA: Diagnosis not present

## 2016-01-07 DIAGNOSIS — I10 Essential (primary) hypertension: Secondary | ICD-10-CM | POA: Diagnosis not present

## 2016-01-07 DIAGNOSIS — K5900 Constipation, unspecified: Secondary | ICD-10-CM | POA: Diagnosis not present

## 2016-01-07 DIAGNOSIS — R0600 Dyspnea, unspecified: Secondary | ICD-10-CM | POA: Diagnosis not present

## 2016-01-07 DIAGNOSIS — R109 Unspecified abdominal pain: Secondary | ICD-10-CM | POA: Diagnosis not present

## 2016-01-07 DIAGNOSIS — D649 Anemia, unspecified: Secondary | ICD-10-CM | POA: Diagnosis not present

## 2016-01-07 DIAGNOSIS — I5020 Unspecified systolic (congestive) heart failure: Secondary | ICD-10-CM | POA: Diagnosis not present

## 2016-01-07 DIAGNOSIS — S300XXA Contusion of lower back and pelvis, initial encounter: Secondary | ICD-10-CM | POA: Diagnosis not present

## 2016-01-07 DIAGNOSIS — I120 Hypertensive chronic kidney disease with stage 5 chronic kidney disease or end stage renal disease: Secondary | ICD-10-CM | POA: Diagnosis not present

## 2016-01-07 DIAGNOSIS — I428 Other cardiomyopathies: Secondary | ICD-10-CM

## 2016-01-07 DIAGNOSIS — D638 Anemia in other chronic diseases classified elsewhere: Secondary | ICD-10-CM | POA: Diagnosis present

## 2016-01-07 DIAGNOSIS — S3992XA Unspecified injury of lower back, initial encounter: Secondary | ICD-10-CM | POA: Diagnosis not present

## 2016-01-07 DIAGNOSIS — Z9119 Patient's noncompliance with other medical treatment and regimen: Secondary | ICD-10-CM | POA: Diagnosis not present

## 2016-01-07 DIAGNOSIS — Z87892 Personal history of anaphylaxis: Secondary | ICD-10-CM | POA: Diagnosis not present

## 2016-01-07 DIAGNOSIS — T8612 Kidney transplant failure: Secondary | ICD-10-CM | POA: Diagnosis present

## 2016-01-07 DIAGNOSIS — F419 Anxiety disorder, unspecified: Secondary | ICD-10-CM | POA: Diagnosis present

## 2016-01-07 DIAGNOSIS — K219 Gastro-esophageal reflux disease without esophagitis: Secondary | ICD-10-CM | POA: Diagnosis not present

## 2016-01-07 DIAGNOSIS — I160 Hypertensive urgency: Secondary | ICD-10-CM | POA: Diagnosis not present

## 2016-01-07 DIAGNOSIS — Z0000 Encounter for general adult medical examination without abnormal findings: Secondary | ICD-10-CM | POA: Diagnosis not present

## 2016-01-07 DIAGNOSIS — I429 Cardiomyopathy, unspecified: Secondary | ICD-10-CM | POA: Diagnosis not present

## 2016-01-07 DIAGNOSIS — Z79899 Other long term (current) drug therapy: Secondary | ICD-10-CM

## 2016-01-07 DIAGNOSIS — Z792 Long term (current) use of antibiotics: Secondary | ICD-10-CM | POA: Diagnosis not present

## 2016-01-07 DIAGNOSIS — G4733 Obstructive sleep apnea (adult) (pediatric): Secondary | ICD-10-CM | POA: Diagnosis present

## 2016-01-07 DIAGNOSIS — I248 Other forms of acute ischemic heart disease: Secondary | ICD-10-CM | POA: Diagnosis present

## 2016-01-07 DIAGNOSIS — S0990XA Unspecified injury of head, initial encounter: Secondary | ICD-10-CM | POA: Diagnosis not present

## 2016-01-07 DIAGNOSIS — I509 Heart failure, unspecified: Secondary | ICD-10-CM | POA: Diagnosis not present

## 2016-01-07 DIAGNOSIS — Z885 Allergy status to narcotic agent status: Secondary | ICD-10-CM | POA: Diagnosis not present

## 2016-01-07 DIAGNOSIS — M109 Gout, unspecified: Secondary | ICD-10-CM | POA: Diagnosis not present

## 2016-01-07 DIAGNOSIS — R296 Repeated falls: Secondary | ICD-10-CM | POA: Diagnosis not present

## 2016-01-07 HISTORY — DX: Hyperkalemia: E87.5

## 2016-01-07 HISTORY — DX: Other specified cardiac arrhythmias: I49.8

## 2016-01-07 HISTORY — DX: Other cardiomyopathies: I42.8

## 2016-01-07 HISTORY — DX: Chronic combined systolic (congestive) and diastolic (congestive) heart failure: I50.42

## 2016-01-07 LAB — COMPREHENSIVE METABOLIC PANEL
ALBUMIN: 3.1 g/dL — AB (ref 3.5–5.0)
ALK PHOS: 117 U/L (ref 38–126)
ALT: 13 U/L — ABNORMAL LOW (ref 17–63)
ANION GAP: 15 (ref 5–15)
AST: 49 U/L — ABNORMAL HIGH (ref 15–41)
BUN: 70 mg/dL — ABNORMAL HIGH (ref 6–20)
CALCIUM: 9.3 mg/dL (ref 8.9–10.3)
CO2: 19 mmol/L — AB (ref 22–32)
Chloride: 100 mmol/L — ABNORMAL LOW (ref 101–111)
Creatinine, Ser: 13.82 mg/dL — ABNORMAL HIGH (ref 0.61–1.24)
GFR calc non Af Amer: 4 mL/min — ABNORMAL LOW (ref >60)
GFR, EST AFRICAN AMERICAN: 4 mL/min — AB (ref >60)
Glucose, Bld: 94 mg/dL (ref 65–99)
POTASSIUM: 6.6 mmol/L — AB (ref 3.5–5.1)
SODIUM: 134 mmol/L — AB (ref 135–145)
TOTAL PROTEIN: 8.4 g/dL — AB (ref 6.5–8.1)
Total Bilirubin: 0.9 mg/dL (ref 0.3–1.2)

## 2016-01-07 LAB — CBC WITH DIFFERENTIAL/PLATELET
BASOS ABS: 0 10*3/uL (ref 0.0–0.1)
BASOS PCT: 0 %
EOS ABS: 0.5 10*3/uL (ref 0.0–0.7)
EOS PCT: 8 %
HEMATOCRIT: 27 % — AB (ref 39.0–52.0)
Hemoglobin: 8.6 g/dL — ABNORMAL LOW (ref 13.0–17.0)
Lymphocytes Relative: 20 %
Lymphs Abs: 1.2 10*3/uL (ref 0.7–4.0)
MCH: 26.7 pg (ref 26.0–34.0)
MCHC: 31.9 g/dL (ref 30.0–36.0)
MCV: 83.9 fL (ref 78.0–100.0)
MONO ABS: 0.5 10*3/uL (ref 0.1–1.0)
MONOS PCT: 8 %
Neutro Abs: 3.8 10*3/uL (ref 1.7–7.7)
Neutrophils Relative %: 64 %
PLATELETS: 184 10*3/uL (ref 150–400)
RBC: 3.22 MIL/uL — ABNORMAL LOW (ref 4.22–5.81)
RDW: 16.6 % — AB (ref 11.5–15.5)
WBC: 6 10*3/uL (ref 4.0–10.5)

## 2016-01-07 LAB — GLUCOSE, CAPILLARY
GLUCOSE-CAPILLARY: 149 mg/dL — AB (ref 65–99)
Glucose-Capillary: 67 mg/dL (ref 65–99)

## 2016-01-07 LAB — LIPASE, BLOOD: Lipase: 33 U/L (ref 11–51)

## 2016-01-07 MED ORDER — ONDANSETRON HCL 4 MG/2ML IJ SOLN
4.0000 mg | Freq: Once | INTRAMUSCULAR | Status: AC
  Administered 2016-01-07: 4 mg via INTRAVENOUS
  Filled 2016-01-07: qty 2

## 2016-01-07 MED ORDER — AMLODIPINE BESYLATE 10 MG PO TABS
10.0000 mg | ORAL_TABLET | Freq: Every day | ORAL | Status: DC
  Administered 2016-01-08 – 2016-01-09 (×2): 10 mg via ORAL
  Filled 2016-01-07 (×3): qty 1

## 2016-01-07 MED ORDER — ONDANSETRON HCL 4 MG PO TABS: 4.0000 mg | ORAL_TABLET | Freq: Four times a day (QID) | ORAL | Status: DC | PRN

## 2016-01-07 MED ORDER — CYCLOBENZAPRINE HCL 10 MG PO TABS
10.0000 mg | ORAL_TABLET | Freq: Three times a day (TID) | ORAL | Status: DC | PRN
  Administered 2016-01-07: 10 mg via ORAL
  Filled 2016-01-07: qty 1

## 2016-01-07 MED ORDER — SODIUM POLYSTYRENE SULFONATE 15 GM/60ML PO SUSP
30.0000 g | Freq: Once | ORAL | Status: AC
  Administered 2016-01-07: 30 g via ORAL
  Filled 2016-01-07: qty 120

## 2016-01-07 MED ORDER — GABAPENTIN 100 MG PO CAPS
100.0000 mg | ORAL_CAPSULE | Freq: Three times a day (TID) | ORAL | Status: DC
  Administered 2016-01-07 – 2016-01-09 (×6): 100 mg via ORAL
  Filled 2016-01-07 (×7): qty 1

## 2016-01-07 MED ORDER — HYDRALAZINE HCL 25 MG PO TABS
50.0000 mg | ORAL_TABLET | Freq: Once | ORAL | Status: AC
  Administered 2016-01-07: 50 mg via ORAL
  Filled 2016-01-07: qty 2

## 2016-01-07 MED ORDER — SEVELAMER CARBONATE 800 MG PO TABS
800.0000 mg | ORAL_TABLET | Freq: Three times a day (TID) | ORAL | Status: DC
  Administered 2016-01-08 (×2): 800 mg via ORAL
  Administered 2016-01-09: 1600 mg via ORAL
  Filled 2016-01-07: qty 1
  Filled 2016-01-07: qty 2
  Filled 2016-01-07: qty 1
  Filled 2016-01-07: qty 2

## 2016-01-07 MED ORDER — HYDROCERIN EX CREA
1.0000 "application " | TOPICAL_CREAM | Freq: Two times a day (BID) | CUTANEOUS | Status: DC
  Administered 2016-01-07 – 2016-01-08 (×2): 1 via TOPICAL
  Filled 2016-01-07: qty 113

## 2016-01-07 MED ORDER — SODIUM CHLORIDE 0.9% FLUSH
3.0000 mL | Freq: Two times a day (BID) | INTRAVENOUS | Status: DC
  Administered 2016-01-08 – 2016-01-09 (×3): 3 mL via INTRAVENOUS

## 2016-01-07 MED ORDER — CINACALCET HCL 30 MG PO TABS
30.0000 mg | ORAL_TABLET | Freq: Every day | ORAL | Status: DC
  Administered 2016-01-09: 30 mg via ORAL
  Filled 2016-01-07 (×3): qty 1

## 2016-01-07 MED ORDER — HYDROXYZINE HCL 10 MG PO TABS
10.0000 mg | ORAL_TABLET | Freq: Three times a day (TID) | ORAL | Status: DC | PRN
  Filled 2016-01-07: qty 1

## 2016-01-07 MED ORDER — OXYCODONE-ACETAMINOPHEN 5-325 MG PO TABS
1.0000 | ORAL_TABLET | Freq: Four times a day (QID) | ORAL | Status: DC | PRN
  Administered 2016-01-07 – 2016-01-08 (×3): 2 via ORAL
  Filled 2016-01-07 (×3): qty 2

## 2016-01-07 MED ORDER — CARVEDILOL 12.5 MG PO TABS
12.5000 mg | ORAL_TABLET | Freq: Two times a day (BID) | ORAL | Status: DC
  Administered 2016-01-08 – 2016-01-09 (×2): 12.5 mg via ORAL
  Filled 2016-01-07 (×2): qty 1

## 2016-01-07 MED ORDER — LISINOPRIL 20 MG PO TABS: 20.0000 mg | ORAL_TABLET | Freq: Every day | ORAL | Status: DC

## 2016-01-07 MED ORDER — ATORVASTATIN CALCIUM 40 MG PO TABS
40.0000 mg | ORAL_TABLET | Freq: Every day | ORAL | Status: DC
  Administered 2016-01-08: 40 mg via ORAL
  Filled 2016-01-07: qty 1

## 2016-01-07 MED ORDER — SODIUM CHLORIDE 0.9 % IV SOLN
INTRAVENOUS | Status: DC
  Administered 2016-01-07: 16:00:00 via INTRAVENOUS

## 2016-01-07 MED ORDER — DOCUSATE SODIUM 100 MG PO CAPS
100.0000 mg | ORAL_CAPSULE | Freq: Two times a day (BID) | ORAL | Status: DC
  Administered 2016-01-07 – 2016-01-09 (×5): 100 mg via ORAL
  Filled 2016-01-07 (×6): qty 1

## 2016-01-07 MED ORDER — HYOSCYAMINE SULFATE 0.125 MG SL SUBL
0.1250 mg | SUBLINGUAL_TABLET | SUBLINGUAL | Status: DC | PRN
  Filled 2016-01-07: qty 1

## 2016-01-07 MED ORDER — HYDRALAZINE HCL 20 MG/ML IJ SOLN
20.0000 mg | Freq: Once | INTRAMUSCULAR | Status: AC
  Administered 2016-01-07: 20 mg via INTRAVENOUS
  Filled 2016-01-07: qty 1

## 2016-01-07 MED ORDER — ALPRAZOLAM 0.5 MG PO TABS
0.5000 mg | ORAL_TABLET | Freq: Two times a day (BID) | ORAL | Status: DC | PRN
Start: 2016-01-07 — End: 2016-01-10
  Administered 2016-01-08 (×2): 0.5 mg via ORAL
  Filled 2016-01-07 (×3): qty 1

## 2016-01-07 MED ORDER — LISINOPRIL 20 MG PO TABS
20.0000 mg | ORAL_TABLET | Freq: Once | ORAL | Status: AC
  Administered 2016-01-07: 20 mg via ORAL
  Filled 2016-01-07: qty 1

## 2016-01-07 MED ORDER — PROMETHAZINE HCL 25 MG/ML IJ SOLN
12.5000 mg | Freq: Once | INTRAMUSCULAR | Status: AC
Start: 2016-01-07 — End: 2016-01-07
  Administered 2016-01-07: 12.5 mg via INTRAVENOUS
  Filled 2016-01-07: qty 1

## 2016-01-07 MED ORDER — LEVALBUTEROL HCL 0.63 MG/3ML IN NEBU: 0.6300 mg | INHALATION_SOLUTION | Freq: Four times a day (QID) | RESPIRATORY_TRACT | Status: DC | PRN

## 2016-01-07 MED ORDER — PANTOPRAZOLE SODIUM 40 MG PO TBEC
40.0000 mg | DELAYED_RELEASE_TABLET | Freq: Every day | ORAL | Status: DC
  Administered 2016-01-08 – 2016-01-09 (×2): 40 mg via ORAL
  Filled 2016-01-07 (×2): qty 1

## 2016-01-07 MED ORDER — ISOSORBIDE MONONITRATE ER 30 MG PO TB24
30.0000 mg | ORAL_TABLET | Freq: Every day | ORAL | Status: DC
  Administered 2016-01-07: 30 mg via ORAL
  Filled 2016-01-07: qty 1

## 2016-01-07 MED ORDER — ONDANSETRON 4 MG PO TBDP: 4.0000 mg | ORAL_TABLET | Freq: Four times a day (QID) | ORAL | Status: DC | PRN

## 2016-01-07 MED ORDER — RENA-VITE PO TABS
1.0000 | ORAL_TABLET | Freq: Every day | ORAL | Status: DC
  Administered 2016-01-07 – 2016-01-09 (×3): 1 via ORAL
  Filled 2016-01-07 (×3): qty 1

## 2016-01-07 MED ORDER — ONDANSETRON HCL 4 MG/2ML IJ SOLN
4.0000 mg | Freq: Four times a day (QID) | INTRAMUSCULAR | Status: DC | PRN
  Administered 2016-01-07 – 2016-01-08 (×3): 4 mg via INTRAVENOUS
  Filled 2016-01-07 (×2): qty 2

## 2016-01-07 NOTE — H&P (Signed)
[facility] Admission History and Physical Service Pager: [phone]  Patient name: Frank [name] Medical record number: [id number] Date of birth: June 14, 1966 Age: 50 y.o. Gender: male  Primary Care Provider: [name] Patient Consultants: Nephrology Code Status: Full (confirmed on admission)  Chief Complaint: SOB, chest pain, abdominal pain  Assessment and Plan: Frank [name] is a 50 y.o. male presenting with chief complaint of dyspnea. PMH is significant for HTN, Depression, Anxiety, CHF, DM?, ESRD s/p failed transplant, anemia, and L renal mass.  #Dyspnea: Maintaining oxygen saturation on RA. Likely 2/2 fluid overload. Mild interstitial edema and cardiomegaly seen on CXR. Had shortened dialysis session 6/17. Trace edema of LEs on exam. Last ECHO 10/31/15 showed EF of 35-40%, LV severe concentric hypertrophy, RVH, and small pericardial effusion. - Admitted to telemetry, Attending Dr. Gwendlyn Deutscher - Monitor oxygen saturation with vital signs - Ordered home xopenex q6h prn - Ordered home xanax 0.5 mg BID prn in case of anxiety component - Dialysis scheduled for 6/19 (a day earlier than normal)  Chest pain: Most likely MSK in nature. Reproducible on exam.  - Monitor on telemetry in setting of electrolyte abnormalities.  - Trend troponin - a.m. EKG  #Abdominal Pain: Lipase 33. Recent CT abdomen 01/01/16 with trace ascites around liver and stomach; left renal mass concerning for renal cell carcinoma; and bilateral rental atrophy. Outside records show patient has been requesting IV pain medication at other EDs. He continued to request IV pain medications, saying he would not be able to keep anything else down, but agreed to taking oral pain medication given with zofran to help with nausea.  - Ordered home flexeril, gabapentin, percocet - Zofran IV and PO ordered prn for nausea. Recheck QTc on EKG tomorrow (was prolonged at 494 on admission).  #HFrEF: last EF of 35-40% in  April of 2017. Unknown dry weight. No overly fluid overloaded on exam. Interstitial edema on chest x-ray. BNP likely to be abnormal since patient is ESRD on dialysis. - daily weights - in/out  #Hyperkalemia: To 6.6 in the ED. No peaked T waves or ST changes on EKG.  - s/p Kayexalate 30 g given in the ED, per Nephrologist Dr. Jonnie Finner - Recheck this evening - BMP in a.m.  #ESRD: Above last reported EDW of 69-70 kg at 74.844 kg on admission. Had shortened session of HD 6/17 due to muscles cramps. Usually receives dialysis TuThSa - Plan for dialysis 6/19 - Nephrology consulted, appreciate recommendations.   - [place] I/Os - Daily weights - Continue renvela, sensipar, renavit  #HTN: BPs to 194/135 in the ED. Fluid overload and/or medication nonadherence thought to be contributing. Restarted BP meds with Dr. Barkley Bruns approval.  - Home amlodipine 10 mg daily, carvedilol 12.5 mg BID, lisinopril 20 mg daily restarted - Restarted home imdur at 30 mg daily (down from 90 mg daily) - Gave hydralazine 50 mg PO and 20 mg IV in ED. Restart home hydralazine 100 mg q8h depending on blood pressures overnight.  #OSA - Ordered CPAP QHS  #Anxiety - Ordered home xanax 0.5 mg PO BID prn - Ordered home hydroxyzine 10 mg TID prn  #Hx of PE - Coumadin per pharmacy consult  FEN/GI: Renal diet, SLIV Prophylaxis: Coumadin per pharmacy consult  Disposition: Admit for chest pain rule-out, monitoring of electrolytes and dialysis tomorrow, 6/19.   History of Present Illness:  Frank [name] is a 50 y.o. male presenting with SOB, chest pain, and abdominal pain. Patient states he is having  dyspnea. He is unsure of when symptoms started. Symptoms are better when walking and worse when sitting. He reports some nausea and three episodes of non-bloody emesis. No diarrhea. He is having associated sharp, substernal pain that is constant. He reports he passed out while driving today, but EMS reports was a near syncopal event.  He refused to answer more extensive questioning, saying he was too short of breath, though was able to speak in full sentences. Patient had shortened dialysis session on Saturday of only 1.5 hours due to muscle cramps.   Patient cannot relay which medications he takes and says he gets them in blister packs. He denies having trouble taking his medications.   Outside records show this is patient's 4th ED visit in just over 1 day.   Review Of Systems: Per HPI with the following additions: Does not make urine.  Otherwise the remainder of the systems were negative.  Patient Active Problem List   Diagnosis Date Noted  . Hyperkalemia 12/16/2015  . Bilateral low back pain without sciatica   . Pericardial effusion 10/30/2015  . Left renal mass 10/30/2015  . Constipation 10/30/2015  . Hypertensive urgency 09/08/2015  . Adjustment disorder with mixed anxiety and depressed mood 08/20/2015  . Chronic epididymitis 06/19/2015  . Groin pain, chronic, right 06/19/2015  . Incarcerated right inguinal hernia 02/16/2015  . Malnutrition of moderate degree ([place]) 02/03/2015  . Left upper extremity deep vein thrombosis ([place]) 02/01/2015  . Drug-seeking behavior 02/01/2015  . Anemia of chronic disease   . Dyslipidemia   . Chronic pulmonary embolism ([place])   . History of noncompliance with medical treatment   . Chronic combined systolic and diastolic CHF (congestive heart failure) ([place])   . ESRD on dialysis- Tues, Thurs, Sat in [place], nephro: [name] 04/13/2013    Past Medical History: Past Medical History  Diagnosis Date  . Hypertension   . Depression   . Complication of anesthesia     itching, sore throat  . Shortness of breath   . Anxiety   . CHF (congestive heart failure) ([place])   . Anemia   . Dialysis patient ([place])   . Type II diabetes mellitus (HCC)     No history per patient, but remains under history as A1c would not be accurate given on dialysis  . ESRD (end stage renal disease) ([place])      due to HTN per patient, followed at [facility], s/p failed kidney transplant - dialysis Tue, Th, Sat  . Renal insufficiency     Past Surgical History: Past Surgical History  Procedure Laterality Date  . Kidney receipient  2006    failed and started HD in March 2014  . Capd insertion    . Capd removal    . Left heart catheterization with coronary angiogram N/A 09/02/2014    Procedure: LEFT HEART CATHETERIZATION WITH CORONARY ANGIOGRAM;  Surgeon: [name], MD;  Location: [facility] CATH LAB;  Service: Cardiovascular;  Laterality: N/A;  . Inguinal hernia repair Right 02/14/2015    Procedure: REPAIR INCARCERATED RIGHT INGUINAL HERNIA;  Surgeon: [name], MD;  Location: [place];  Service: General;  Laterality: Right;  . Insertion of dialysis catheter Right 09/23/2015    Procedure: exchange of Right internal Dialysis Catheter.;  Surgeon: [name], MD;  Location: [facility] OR;  Service: Vascular;  Laterality: Right;    Social History: Social History  Substance Use Topics  . Smoking status: Former Smoker -- 0.00 packs/day for 1 years  Types: Cigarettes  . Smokeless tobacco: Never Used     Comment: quit Jan 2014  . Alcohol Use: No   Additional social history: Lives alone.  Please also refer to relevant sections of EMR.  Family History: Family History  Problem Relation Age of Onset  . Hypertension Other    Allergies and Medications: Allergies  Allergen Reactions  . Butalbital-Apap-Caffeine Shortness Of Breath and Swelling    Swelling in throat  . Ferrlecit [Na Ferric Gluc Cplx In Sucrose] Shortness Of Breath, Swelling and Other (See Comments)    Swelling in throat  . Minoxidil Shortness Of Breath  . Darvocet [Propoxyphene N-Acetaminophen] Hives   No current facility-administered medications on file prior to encounter.   Current Outpatient Prescriptions on File Prior to Encounter  Medication Sig Dispense Refill  . ALPRAZolam (XANAX) 0.5 MG tablet Take 1 tablet (0.5 mg total) by  mouth 2 (two) times daily as needed for anxiety. 15 tablet 0  . amLODipine (NORVASC) 10 MG tablet Take 10 mg by mouth daily.    [name] atorvastatin (LIPITOR) 40 MG tablet Take 1 tablet (40 mg total) by mouth daily at 6 PM. 30 tablet 1  . carvedilol (COREG) 12.5 MG tablet Take 1 tablet (12.5 mg total) by mouth 2 (two) times daily with a meal. 60 tablet 0  . cinacalcet (SENSIPAR) 30 MG tablet Take 30 mg by mouth daily.    . cyclobenzaprine (FLEXERIL) 10 MG tablet Take 1 tablet (10 mg total) by mouth 3 (three) times daily as needed for muscle spasms. 60 tablet 0  . docusate sodium (COLACE) 100 MG capsule Take 1 capsule (100 mg total) by mouth 2 (two) times daily. 60 capsule 0  . doxercalciferol (HECTOROL) 4 MCG/2ML injection Inject 1.25 mLs (2.5 mcg total) into the vein Every Tuesday,Thursday,and Saturday with dialysis. 2 mL   . gabapentin (NEURONTIN) 100 MG capsule Take 1 capsule (100 mg total) by mouth 3 (three) times daily. 90 capsule 0  . hydrALAZINE (APRESOLINE) 50 MG tablet Take 2 tablets (100 mg total) by mouth every 8 (eight) hours. 90 tablet 1  . hydrocerin (EUCERIN) CREA Apply 1 application topically 2 (two) times daily. 454 g 0  . HYDROcodone-acetaminophen (NORCO) 5-325 MG tablet Take 1-2 tablets by mouth every 6 (six) hours as needed. 15 tablet 0  . hydrOXYzine (ATARAX/VISTARIL) 10 MG tablet Take 1 tablet (10 mg total) by mouth 3 (three) times daily as needed for itching. 30 tablet 0  . hyoscyamine (LEVSIN/SL) 0.125 MG SL tablet Place 1 tablet (0.125 mg total) under the tongue every 4 (four) hours as needed. (Patient taking differently: Place 0.125 mg under the tongue every 4 (four) hours as needed for cramping. ) 30 tablet 0  . ibuprofen (ADVIL,MOTRIN) 600 MG tablet Take 1 tablet (600 mg total) by mouth every 8 (eight) hours as needed. (Patient taking differently: Take 600 mg by mouth every 8 (eight) hours as needed for moderate pain. ) 15 tablet 0  . isosorbide mononitrate (IMDUR) 30 MG 24 hr  tablet Take 3 tablets (90 mg total) by mouth daily. 90 tablet 0  . levalbuterol (XOPENEX HFA) 45 MCG/ACT inhaler Inhale 2 puffs into the lungs every 6 (six) hours as needed for wheezing or shortness of breath. 1 Inhaler 2  . lisinopril (PRINIVIL,ZESTRIL) 20 MG tablet Take 1 tablet (20 mg total) by mouth daily. 30 tablet 1  . metoCLOPramide (REGLAN) 10 MG tablet Take 1 tablet (10 mg total) by mouth every 6 (six) hours as needed  for nausea or vomiting. 20 tablet 0  . multivitamin (RENA-VIT) TABS tablet Take 1 tablet by mouth at bedtime. 30 tablet 1  . omeprazole (PRILOSEC) 20 MG capsule Take 20 mg by mouth daily.    . ondansetron (ZOFRAN ODT) 8 MG disintegrating tablet 59m ODT q4 hours prn nausea (Patient taking differently: Take 8 mg by mouth every 4 (four) hours as needed for nausea. 840mODT q4 hours prn nausea) 6 tablet 0  . ondansetron (ZOFRAN) 4 MG tablet Take 1 tablet (4 mg total) by mouth every 6 (six) hours as needed for nausea or vomiting. 30 tablet 0  . oxyCODONE-acetaminophen (PERCOCET/ROXICET) 5-325 MG tablet Take 1-2 tablets by mouth every 6 (six) hours as needed for severe pain. 5 tablet 0  . predniSONE (DELTASONE) 5 MG tablet Take 5 mg by mouth daily with breakfast.    . sevelamer carbonate (RENVELA) 800 MG tablet Take 800-1,600 mg by mouth 3 (three) times daily with meals. 2 tabs three times daily with meals, and 1 tablet with snacks    . traMADol (ULTRAM) 50 MG tablet Take 1 tablet (50 mg total) by mouth every 6 (six) hours as needed for moderate pain. 15 tablet 0  . warfarin (COUMADIN) 6 MG tablet Take 1.5 tablets (9 mg total) by mouth daily at 6 PM. Take daily until adjusted per MD 20 tablet 0    Objective: BP 194/135 mmHg  Pulse 75  Temp(Src) 97.8 F (36.6 C) (Oral)  Resp 15  SpO2 100% Exam: General: Chronically ill appearing male, sitting up in bed Eyes: Scleral icterus, PERRLA, EOMI ENTM: MMM, oropharynx normal Neck: Supple, FROM, no JVD noted Cardiovascular: RRR,  normal S1, S2. S4 appreciated. no murmurs appreciated Respiratory: CTAB, speaking in complete sentences Abdomen: Distended, TTP with minimal touch, would not allow further examination MSK: Trace LE edema; right upper chest dialysis catheter in place Skin: LEs with cracked, dry skin. Thickened toenails. Neuro: AOx3, no focal deficits. Psych: Became frustrated with requests to answer questions, Irritable mood  Labs and Imaging: CBC BMET   Recent Labs Lab 01/07/16 1605  WBC 6.0  HGB 8.6*  HCT 27.0*  PLT 184    Recent Labs Lab 01/07/16 1720  NA 134*  K 6.6*  CL 100*  CO2 19*  BUN 70*  CREATININE 13.82*  GLUCOSE 94  CALCIUM 9.3     Dg Chest 2 View  01/07/2016  CLINICAL DATA:  Patient with shortness of breath, fever and nausea. EXAM: CHEST  2 VIEW COMPARISON:  Chest radiograph 12/16/2015. FINDINGS: Central venous catheter tip projects over the superior vena cava. Stable marked cardiomegaly. Pulmonary vascular redistribution and bilateral perihilar interstitial pulmonary opacities. Mild pleural thickening. No definite pleural effusion. Regional skeleton is unremarkable. IMPRESSION: Cardiomegaly and mild interstitial pulmonary edema. Electronically Signed   By: DrLovey Newcomer.D.   On: 01/07/2016 16:00    HiRogue BussingMD 01/07/2016, 7:11 PM PGY-1, [facility] pager: [phone] pages welcome  I have seen and examined the patient. I have read and agree with the above note. My changes are noted in blue.  RaCordelia PocheMD PGY-3, [facility] 01/07/2016, 10:55 PM

## 2016-01-07 NOTE — ED Notes (Signed)
Pt pacing the hallway demaniding pain medication. MD made aware of pt request. Pt instructed to go back into his room. Pt refusing to remain on the monitor. Pt sts "this is getting ridiculous." Pt refuses to answer questions for registration, sts "I can't tell y'all my birthday no more."

## 2016-01-07 NOTE — ED Notes (Signed)
Pt tolerating coffee at this time.

## 2016-01-07 NOTE — ED Notes (Signed)
Pt refusing to drink Kayexalate until he goes upstairs.

## 2016-01-07 NOTE — ED Notes (Signed)
Pt removed himself off the monitor and ambulated to the restroom without issue, steady gait.

## 2016-01-07 NOTE — Progress Notes (Signed)
RT placed patient on CPAP auto with 2L O2 bleed in. Patient tolerating well. RT will continue to monitor as needed.

## 2016-01-07 NOTE — Progress Notes (Addendum)
Pt is refusing to allow staff to take vitals or do anything. He is refusing telemetry.  All that he is requesting is iv pain medication.

## 2016-01-07 NOTE — ED Notes (Addendum)
Patient states sitting on edge of the stretcher is the only way he can sit comfortably. Requested the patient sit on the stretcher with side-rails up due to near syncopal episode but patient refused on multiple occasions.

## 2016-01-07 NOTE — Progress Notes (Signed)
Lab came three times to draw blood.  Patient told them each time to come back in 10-20 minutes.  Staff informed patient of the importance of getting procedures accomplished in order to assist in getting him well.  Patient is adamant on doing things his way and not in compliance with hospital in place procedures.  We are making as much accommodations for patient but there are limits to what we are able to do.

## 2016-01-07 NOTE — ED Notes (Signed)
Pt requesting coffee, okay to give per Dr. Rogene Houston

## 2016-01-07 NOTE — ED Provider Notes (Signed)
Care is assumed for Dr. Rogene Houston. He advises patient has a pending chemistry and small amount of vascular overload on chest x-ray. Patient is ESRD and reportedly stopped his last dialysis short at approximately an hour due to pain complaints. Return of basic chemistry panel shows patient to have a potassium 6.6.  Consultation with Dr. Jonnie Finner, nephrology. Advises to administer Kayexalate, admitted to hospitalist and he will be seen in consult in the morning. Consult with medical teaching service. Patient will be admitted under Dr. Gwendlyn Deutscher.  Charlesetta Shanks, MD 01/28/16 [phone]

## 2016-01-07 NOTE — ED Notes (Signed)
K of 6.6 given to MD Pfeiffer

## 2016-01-07 NOTE — ED Provider Notes (Signed)
CSN: [id number]     Arrival date & time 01/07/16  1333 History   First MD Initiated Contact with Patient 01/07/16 1447     Chief Complaint  Patient presents with  . Abdominal Pain  . Near Syncope     (Consider location/radiation/quality/duration/timing/severity/associated sxs/prior Treatment) Patient is a 50 y.o. male presenting with abdominal pain and near-syncope. The history is provided by the patient.  Abdominal Pain Associated symptoms: nausea, shortness of breath and vomiting   Associated symptoms: no chest pain and no fever   Near Syncope Associated symptoms include abdominal pain and shortness of breath. Pertinent negatives include no chest pain.  Patient brought in by EMS.  Patient seen frequently for various pain complaints normally abdominal pain. Patient is a dialysis patient normally dialyzed Tuesdays Thursdays and Saturdays. Patient states that he only received one hour of dialysis yesterday because he had nausea and vomiting so they cut it short. Patient with complaint of passing out as well as epigastric abdominal pain, and persistent nausea and vomiting. Patient states that the epigastric bowel pain radiates to the back. Patient also with complaint of body aches. Patient requesting pain medication for the abdominal pain.  Past Medical History  Diagnosis Date  . Hypertension   . Depression   . Complication of anesthesia     itching, sore throat  . Shortness of breath   . Anxiety   . CHF (congestive heart failure) ([place])   . Anemia   . Dialysis patient ([place])   . Type II diabetes mellitus (HCC)     No history per patient, but remains under history as A1c would not be accurate given on dialysis  . ESRD (end stage renal disease) ([place])     due to HTN per patient, followed at [facility], s/p failed kidney transplant - dialysis Tue, Th, Sat  . Renal insufficiency    Past Surgical History  Procedure Laterality Date  . Kidney receipient  2006    failed and started HD in  March 2014  . Capd insertion    . Capd removal    . Left heart catheterization with coronary angiogram N/A 09/02/2014    Procedure: LEFT HEART CATHETERIZATION WITH CORONARY ANGIOGRAM;  Surgeon: [name], MD;  Location: [facility] CATH LAB;  Service: Cardiovascular;  Laterality: N/A;  . Inguinal hernia repair Right 02/14/2015    Procedure: REPAIR INCARCERATED RIGHT INGUINAL HERNIA;  Surgeon: [name], MD;  Location: [place];  Service: General;  Laterality: Right;  . Insertion of dialysis catheter Right 09/23/2015    Procedure: exchange of Right internal Dialysis Catheter.;  Surgeon: [name], MD;  Location: [facility] OR;  Service: Vascular;  Laterality: Right;   Family History  Problem Relation Age of Onset  . Hypertension Other    Social History  Substance Use Topics  . Smoking status: Former Smoker -- 0.00 packs/day for 1 years    Types: Cigarettes  . Smokeless tobacco: Never Used     Comment: quit Jan 2014  . Alcohol Use: No    Review of Systems  Constitutional: Negative for fever.  HENT: Negative for congestion.   Eyes: Negative for visual disturbance.  Respiratory: Positive for shortness of breath.   Cardiovascular: Positive for near-syncope. Negative for chest pain.  Gastrointestinal: Positive for nausea, vomiting and abdominal pain.  Musculoskeletal: Positive for myalgias.  Skin: Negative for rash.  Neurological: Positive for syncope and light-headedness.  Hematological: Bruises/bleeds easily.  Psychiatric/Behavioral: Negative for confusion.      Allergies  Butalbital-apap-caffeine; Ferrlecit; Minoxidil; and Darvocet  Home Medications   Prior to Admission medications   Medication Sig Start Date End Date Taking? Authorizing Provider  ALPRAZolam [name]) 0.5 MG tablet Take 1 tablet (0.5 mg total) by mouth 2 (two) times daily as needed for anxiety. 11/04/15   [name], MD  amLODipine (NORVASC) 10 MG tablet Take 10 mg by mouth daily.    Historical Provider, MD   atorvastatin (LIPITOR) 40 MG tablet Take 1 tablet (40 mg total) by mouth daily at 6 PM. 09/02/14   Barton Dubois, MD  carvedilol (COREG) 12.5 MG tablet Take 1 tablet (12.5 mg total) by mouth 2 (two) times daily with a meal. 11/04/15   [name], MD  cinacalcet (SENSIPAR) 30 MG tablet Take 30 mg by mouth daily.    Historical Provider, MD  cyclobenzaprine (FLEXERIL) 10 MG tablet Take 1 tablet (10 mg total) by mouth 3 (three) times daily as needed for muscle spasms. 11/04/15   [name], MD  docusate sodium (COLACE) 100 MG capsule Take 1 capsule (100 mg total) by mouth 2 (two) times daily. 11/19/14   [name], MD  doxercalciferol (HECTOROL) 4 MCG/2ML injection Inject 1.25 mLs (2.5 mcg total) into the vein Every Tuesday,Thursday,and Saturday with dialysis. 10/10/14   [name], DO  gabapentin (NEURONTIN) 100 MG capsule Take 1 capsule (100 mg total) by mouth 3 (three) times daily. 11/04/15 03/21/16  [name], MD  hydrALAZINE (APRESOLINE) 50 MG tablet Take 2 tablets (100 mg total) by mouth every 8 (eight) hours. 09/11/15   [name], MD  hydrocerin (EUCERIN) CREA Apply 1 application topically 2 (two) times daily. 02/04/15   [name], MD  HYDROcodone-acetaminophen (NORCO) 5-325 MG tablet Take 1-2 tablets by mouth every 6 (six) hours as needed. 12/31/15   Veryl Speak, MD  hydrOXYzine (ATARAX/VISTARIL) 10 MG tablet Take 1 tablet (10 mg total) by mouth 3 (three) times daily as needed for itching. 09/27/15   [name], MD  hyoscyamine (LEVSIN/SL) 0.125 MG SL tablet Place 1 tablet (0.125 mg total) under the tongue every 4 (four) hours as needed. Patient taking differently: Place 0.125 mg under the tongue every 4 (four) hours as needed for cramping.  11/17/15   April Palumbo, MD  ibuprofen (ADVIL,MOTRIN) 600 MG tablet Take 1 tablet (600 mg total) by mouth every 8 (eight) hours as needed. Patient taking differently: Take 600 mg by mouth every 8 (eight) hours as needed for  moderate pain.  10/06/15   [name], MD  isosorbide mononitrate (IMDUR) 30 MG 24 hr tablet Take 3 tablets (90 mg total) by mouth daily. 09/11/15   [name], MD  levalbuterol [facility] HFA) 45 MCG/ACT inhaler Inhale 2 puffs into the lungs every 6 (six) hours as needed for wheezing or shortness of breath. 11/19/14   [name], MD  lisinopril (PRINIVIL,ZESTRIL) 20 MG tablet Take 1 tablet (20 mg total) by mouth daily. 02/04/15   [name], MD  metoCLOPramide (REGLAN) 10 MG tablet Take 1 tablet (10 mg total) by mouth every 6 (six) hours as needed for nausea or vomiting. 12/17/15   Orpah Greek, MD  multivitamin (RENA-VIT) TABS tablet Take 1 tablet by mouth at bedtime. 02/04/15   [name], MD  omeprazole (PRILOSEC) 20 MG capsule Take 20 mg by mouth daily. 07/01/13   Historical Provider, MD  ondansetron (ZOFRAN ODT) 8 MG disintegrating tablet 63m ODT q4 hours prn nausea Patient  taking differently: Take 8 mg by mouth every 4 (four) hours as needed for nausea. 68m ODT q4 hours prn nausea 12/31/15   DVeryl Speak MD  ondansetron (ZOFRAN) 4 MG tablet Take 1 tablet (4 mg total) by mouth every 6 (six) hours as needed for nausea or vomiting. 12/17/15   COrpah Greek MD  oxyCODONE-acetaminophen (PERCOCET/ROXICET) 5-325 MG tablet Take 1-2 tablets by mouth every 6 (six) hours as needed for severe pain. 11/13/15   JCarlisle Cater PA-C  predniSONE (DELTASONE) 5 MG tablet Take 5 mg by mouth daily with breakfast.    Historical Provider, MD  sevelamer carbonate (RENVELA) 800 MG tablet Take 800-1,600 mg by mouth 3 (three) times daily with meals. 2 tabs three times daily with meals, and 1 tablet with snacks    Historical Provider, MD  traMADol (ULTRAM) 50 MG tablet Take 1 tablet (50 mg total) by mouth every 6 (six) hours as needed for moderate pain. 11/04/15   Ripudeep KKrystal Eaton MD  warfarin (COUMADIN) 6 MG tablet Take 1.5 tablets (9 mg total) by mouth daily at 6 PM. Take daily until adjusted per  MD 09/12/15   PThurnell Lose MD   BP 169/113 mmHg  Pulse 58  Temp(Src) 97.8 F (36.6 C) (Oral)  Resp 18  SpO2 100% Physical Exam  Constitutional: He is oriented to person, place, and time. He appears well-developed and well-nourished. No distress.  HENT:  Head: Normocephalic and atraumatic.  Mouth/Throat: Oropharynx is clear and moist.  Eyes: Conjunctivae and EOM are normal. Pupils are equal, round, and reactive to light.  Neck: Normal range of motion.  Cardiovascular: Normal rate, regular rhythm and normal heart sounds.   Pulmonary/Chest: Effort normal and breath sounds normal. No respiratory distress.  Abdominal: Soft. Bowel sounds are normal. He exhibits no distension. There is no tenderness.  Musculoskeletal: Normal range of motion. He exhibits no edema.  Patient of right upper chest with dialysis catheter in place.  Neurological: He is alert and oriented to person, place, and time. No cranial nerve deficit. He exhibits normal muscle tone. Coordination normal.  Skin: No rash noted.  Nursing note and vitals reviewed.   ED Course  Procedures (including critical care time) Labs Review Labs Reviewed  CBC WITH DIFFERENTIAL/PLATELET - Abnormal; Notable for the following:    RBC 3.22 (*)    Hemoglobin 8.6 (*)    HCT 27.0 (*)    RDW 16.6 (*)    All other components within normal limits  LIPASE, BLOOD  COMPREHENSIVE METABOLIC PANEL   Results for orders placed or performed during the hospital encounter of 01/07/16  CBC with Differential/Platelet  Result Value Ref Range   WBC 6.0 4.0 - 10.5 K/uL   RBC 3.22 (L) 4.22 - 5.81 MIL/uL   Hemoglobin 8.6 (L) 13.0 - 17.0 g/dL   HCT 27.0 (L) 39.0 - 52.0 %   MCV 83.9 78.0 - 100.0 fL   MCH 26.7 26.0 - 34.0 pg   MCHC 31.9 30.0 - 36.0 g/dL   RDW 16.6 (H) 11.5 - 15.5 %   Platelets 184 150 - 400 K/uL   Neutrophils Relative % 64 %   Neutro Abs 3.8 1.7 - 7.7 K/uL   Lymphocytes Relative 20 %   Lymphs Abs 1.2 0.7 - 4.0 K/uL   Monocytes  Relative 8 %   Monocytes Absolute 0.5 0.1 - 1.0 K/uL   Eosinophils Relative 8 %   Eosinophils Absolute 0.5 0.0 - 0.7 K/uL   Basophils Relative 0 %  Basophils Absolute 0.0 0.0 - 0.1 K/uL     Imaging Review Dg Chest 2 View  01/07/2016  CLINICAL DATA:  Patient with shortness of breath, fever and nausea. EXAM: CHEST  2 VIEW COMPARISON:  Chest radiograph 12/16/2015. FINDINGS: Central venous catheter tip projects over the superior vena cava. Stable marked cardiomegaly. Pulmonary vascular redistribution and bilateral perihilar interstitial pulmonary opacities. Mild pleural thickening. No definite pleural effusion. Regional skeleton is unremarkable. IMPRESSION: Cardiomegaly and mild interstitial pulmonary edema. Electronically Signed   By: Lovey Newcomer M.D.   On: 01/07/2016 16:00   I have personally reviewed and evaluated these images and lab results as part of my medical decision-making.   EKG Interpretation None      ED ECG REPORT   Date: 01/07/2016  Rate: 68  Rhythm: normal sinus rhythm  QRS Axis: left  Intervals: normal  ST/T Wave abnormalities: nonspecific ST/T changes  Conduction Disutrbances:left anterior fascicular block  Narrative Interpretation:   Old EKG Reviewed: none available  I have personally reviewed the EKG tracing and agree with the computerized printout as noted.     MDM   Final diagnoses:  ESRD (end stage renal disease) on dialysis ([place])  Chronic abdominal pain  SOB (shortness of breath)  Near syncope    Patient presents today with complaint of epigastric abdominal pain. Patient has a history of chronic abdominal pain and abnormal CT of the abdomen on June 12. Patient also with complaint of shortness of breath. Normally dialyzed Tuesday Thursdays and Saturdays had partial dialysis on Saturday but they did not complete it because he had nausea and vomiting. Patient complaint of some shortness of breath chest pain here shows no significant pulmonary edema.  Electrolytes are still pending. Disposition may be based on whether patient requires urgent dialysis or not.  In addition not complete metabolic panel and lipase are pending for comparison to previous labs. Patient's CBC without leukocytosis or significant anemia.  Patient was informed that he would be treated for the nausea no vomiting here but would not receive pain medicines. Patient has been pushing for pain medicines. Patient frequently does when he arrives.  Disposition will be based on the electrolytes. Patient also stated that he had passed out in the vehicle but the embolus report was that it was a near syncopal episode.  Outpatient despite the complaint of abdominal pain has requested coffee and something to eat. We will provide that.  Fredia Sorrow, MD 01/07/16 [phone]

## 2016-01-07 NOTE — Progress Notes (Signed)
Patient is refusing bed alarm to be on and he insists on having his bed high off of the floor.  He has been educated on his risk for falls due to his abdominal pain and feeling weak.  Patient still refuses bed alarm and refuses bed in lowest position.

## 2016-01-07 NOTE — ED Notes (Signed)
Per [facility], pt on his way here due to abdominal pain n/v. Drove here, passed out at the wheel and woke up. Pt T TH Sat dialysis. Was unable to finish treatment yesterday due to N/V. Hx of htn. Denies hitting head from accident, Pt is AAOX4. VSS. EKG unremarkable

## 2016-01-07 NOTE — Progress Notes (Addendum)
ANTICOAGULATION CONSULT NOTE - Initial Consult  Pharmacy Consult:  Coumadin Indication:  History of PE  Allergies  Allergen Reactions  . Butalbital-Apap-Caffeine Shortness Of Breath and Swelling    Swelling in throat  . Ferrlecit [Na Ferric Gluc Cplx In Sucrose] Shortness Of Breath, Swelling and Other (See Comments)    Swelling in throat  . Minoxidil Shortness Of Breath  . Darvocet [Propoxyphene N-Acetaminophen] Hives    Patient Measurements: Height = 74 inches Weight = 74.8 kg  Vital Signs: Temp: 97.8 F (36.6 C) (06/18 1339) Temp Source: Oral (06/18 1339) BP: 194/135 mmHg (06/18 1742) Pulse Rate: 75 (06/18 1742)  Labs:  Recent Labs  01/07/16 1605 01/07/16 1720  HGB 8.6*  --   HCT 27.0*  --   PLT 184  --   CREATININE  --  13.82*    Estimated Creatinine Clearance: 6.7 mL/min (by C-G formula based on Cr of 13.82).   Medical History: Past Medical History  Diagnosis Date  . Hypertension   . Depression   . Complication of anesthesia     itching, sore throat  . Shortness of breath   . Anxiety   . CHF (congestive heart failure) ([place])   . Anemia   . Dialysis patient ([place])   . Type II diabetes mellitus (HCC)     No history per patient, but remains under history as A1c would not be accurate given on dialysis  . ESRD (end stage renal disease) ([place])     due to HTN per patient, followed at [facility], s/p failed kidney transplant - dialysis Tue, Th, Sat  . Renal insufficiency       Assessment: 34 YOM presented with abdominal pain, nausea and vomiting.  Pharmacy consulted to continue Coumadin from PTA for history of PE.  Patient reports taking Coumadin 80m daily PTA and his last dose was 2-3 days ago.  INR pending collection.   Goal of Therapy:  INR 2-3    Plan:  - STAT INR to guide dosing - Daily PT / INR   Thuy D. DMina Marble PharmD, BCPS Pager:  [phone]/18/2017, 8:35 PM     ADDENDUM: Pt has refused lab draw this pm after multiple attempts by  phlebotomy.  Will need to await am labs prior to giving Coumadin. VWynona Neat PharmD, BCPS 01/07/2016 11:09 PM

## 2016-01-08 DIAGNOSIS — R1084 Generalized abdominal pain: Secondary | ICD-10-CM

## 2016-01-08 DIAGNOSIS — N186 End stage renal disease: Secondary | ICD-10-CM

## 2016-01-08 DIAGNOSIS — Z992 Dependence on renal dialysis: Secondary | ICD-10-CM

## 2016-01-08 DIAGNOSIS — R0600 Dyspnea, unspecified: Secondary | ICD-10-CM

## 2016-01-08 DIAGNOSIS — E875 Hyperkalemia: Secondary | ICD-10-CM

## 2016-01-08 DIAGNOSIS — R109 Unspecified abdominal pain: Secondary | ICD-10-CM | POA: Insufficient documentation

## 2016-01-08 DIAGNOSIS — R55 Syncope and collapse: Secondary | ICD-10-CM

## 2016-01-08 DIAGNOSIS — R0602 Shortness of breath: Secondary | ICD-10-CM

## 2016-01-08 LAB — BASIC METABOLIC PANEL
Anion gap: 15 (ref 5–15)
Anion gap: 11 (ref 5–15)
BUN: 82 mg/dL — AB (ref 6–20)
BUN: 33 mg/dL — AB (ref 6–20)
CHLORIDE: 98 mmol/L — AB (ref 101–111)
CO2: 19 mmol/L — ABNORMAL LOW (ref 22–32)
CO2: 27 mmol/L (ref 22–32)
CREATININE: 14.62 mg/dL — AB (ref 0.61–1.24)
Calcium: 8.9 mg/dL (ref 8.9–10.3)
Calcium: 8.9 mg/dL (ref 8.9–10.3)
Chloride: 101 mmol/L (ref 101–111)
Creatinine, Ser: 7.77 mg/dL — ABNORMAL HIGH (ref 0.61–1.24)
GFR calc Af Amer: 4 mL/min — ABNORMAL LOW (ref >60)
GFR calc Af Amer: 8 mL/min — ABNORMAL LOW (ref >60)
GFR calc non Af Amer: 7 mL/min — ABNORMAL LOW (ref >60)
GFR, EST NON AFRICAN AMERICAN: 3 mL/min — AB (ref >60)
Glucose, Bld: 65 mg/dL (ref 65–99)
Glucose, Bld: 102 mg/dL — ABNORMAL HIGH (ref 65–99)
POTASSIUM: 4 mmol/L (ref 3.5–5.1)
Potassium: 6.2 mmol/L (ref 3.5–5.1)
SODIUM: 135 mmol/L (ref 135–145)
SODIUM: 136 mmol/L (ref 135–145)

## 2016-01-08 LAB — TROPONIN I
TROPONIN I: 0.07 ng/mL — AB (ref <0.031)
Troponin I: 0.07 ng/mL — ABNORMAL HIGH (ref <0.031)
Troponin I: 0.08 ng/mL — ABNORMAL HIGH (ref <0.031)

## 2016-01-08 LAB — CBC
HCT: 22.3 % — ABNORMAL LOW (ref 39.0–52.0)
Hemoglobin: 7 g/dL — ABNORMAL LOW (ref 13.0–17.0)
MCH: 26 pg (ref 26.0–34.0)
MCHC: 31.4 g/dL (ref 30.0–36.0)
MCV: 82.9 fL (ref 78.0–100.0)
PLATELETS: 185 10*3/uL (ref 150–400)
RBC: 2.69 MIL/uL — ABNORMAL LOW (ref 4.22–5.81)
RDW: 16.1 % — AB (ref 11.5–15.5)
WBC: 5.4 10*3/uL (ref 4.0–10.5)

## 2016-01-08 LAB — PROTIME-INR
INR: 1.63 — AB (ref 0.00–1.49)
Prothrombin Time: 19.4 seconds — ABNORMAL HIGH (ref 11.6–15.2)

## 2016-01-08 LAB — GLUCOSE, CAPILLARY: GLUCOSE-CAPILLARY: 120 mg/dL — AB (ref 65–99)

## 2016-01-08 IMAGING — US US ART/VEN ABD/PELV/SCROTUM DOPPLER LTD
1 series · 13 of 25 positions shown · non-contrast
Comparison: Scrotal ultrasound performed 03/10/2015, and CT of the
abdomen and pelvis from 03/11/2015

CLINICAL DATA: Acute onset of right testicular pain. Initial
encounter.

EXAM:
SCROTAL ULTRASOUND
DOPPLER ULTRASOUND OF THE TESTICLES
TECHNIQUE: Complete ultrasound examination of the testicles, epididymis, and
other scrotal structures was performed. Color and spectral Doppler
ultrasound were also utilized to evaluate blood flow to the
testicles.

[Series 1: us art/ven abd/pelv/scrotum doppler ltd · 0.06mm/px · 13 of 52 slices shown]
[im 1/52]
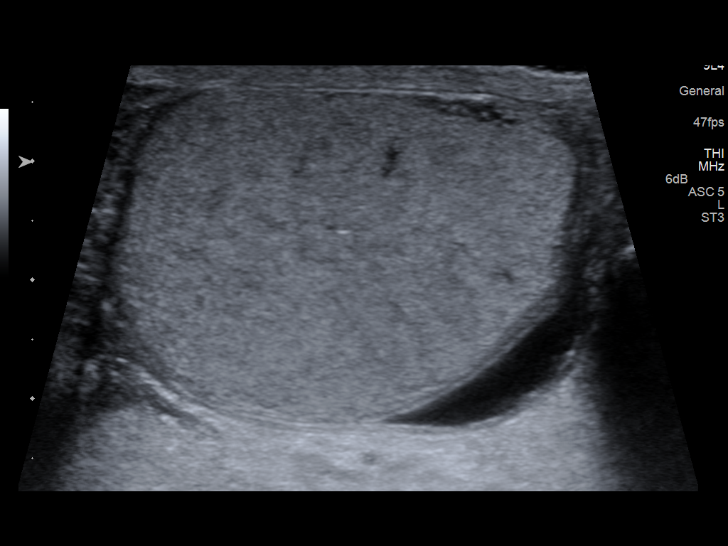
[im 5/52]
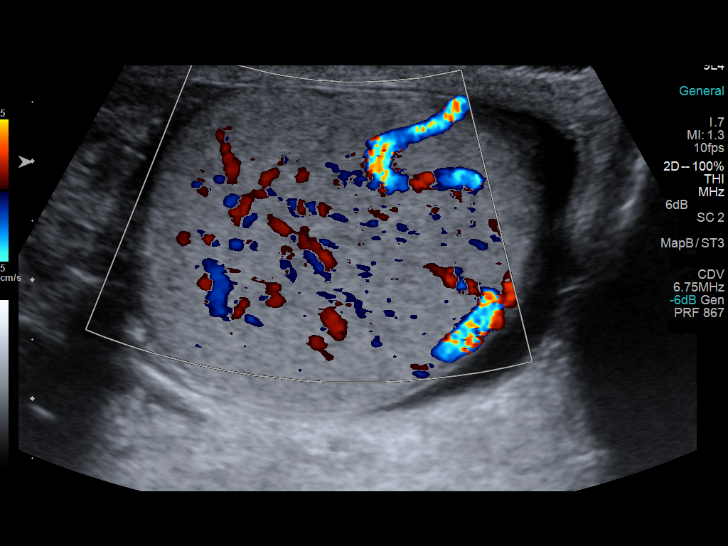
[im 9/52]
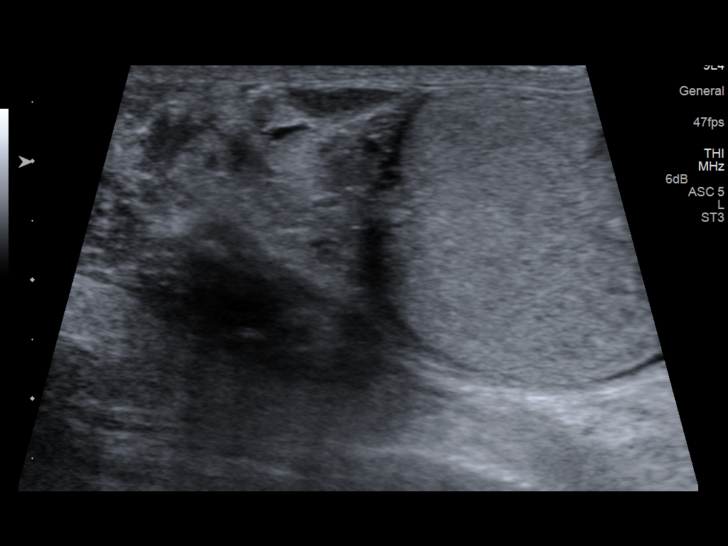
[im 13/52]
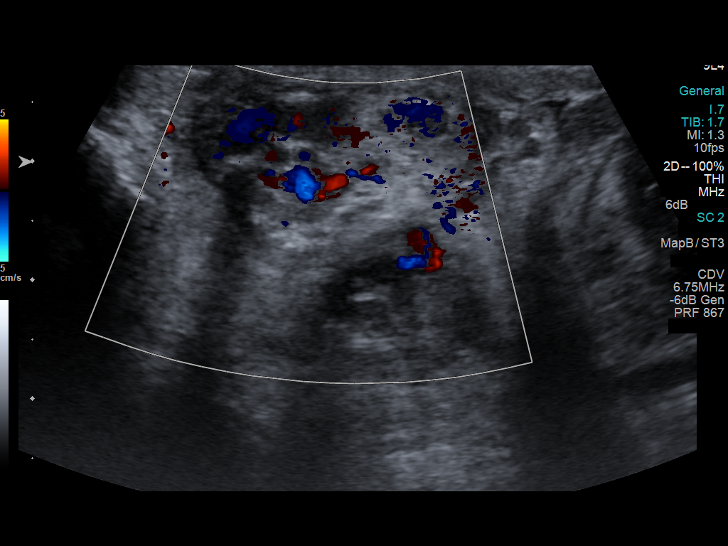
[im 18/52]
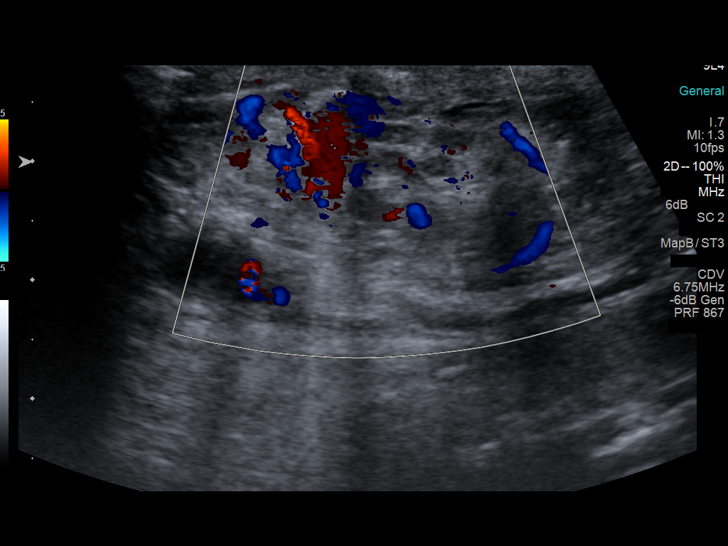
[im 22/52]
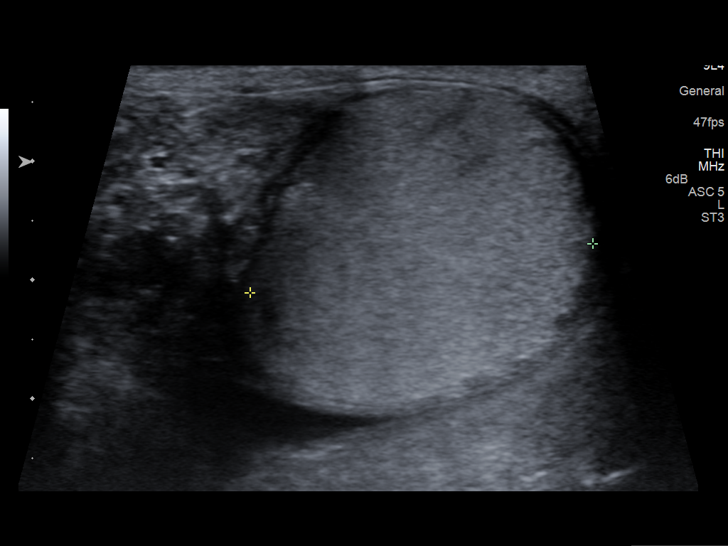
[im 26/52]
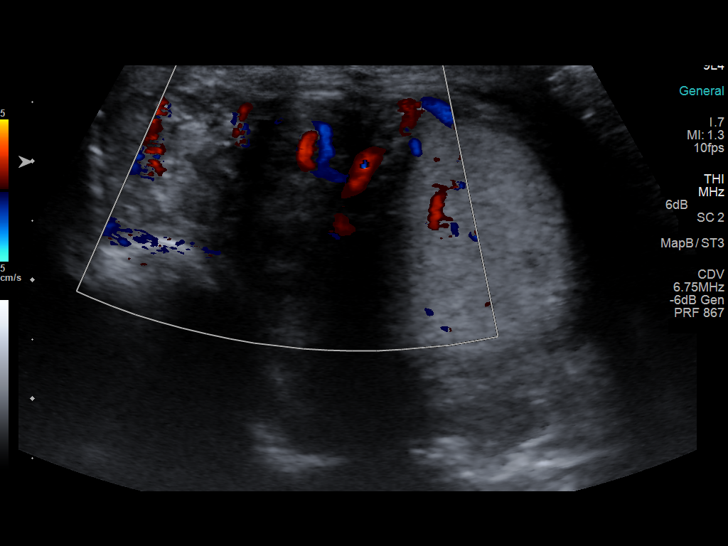
[im 30/52]
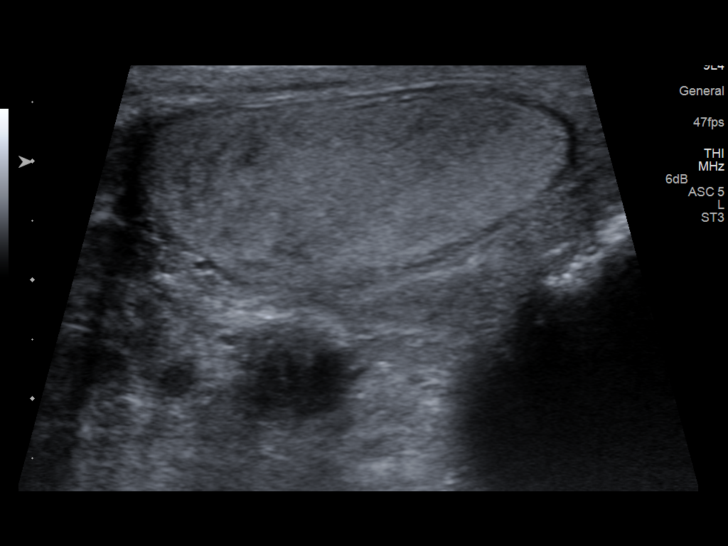
[im 35/52]
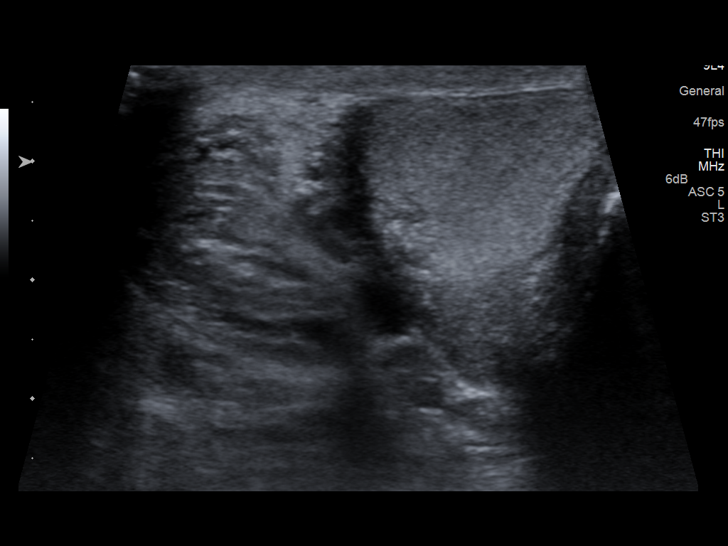
[im 39/52]
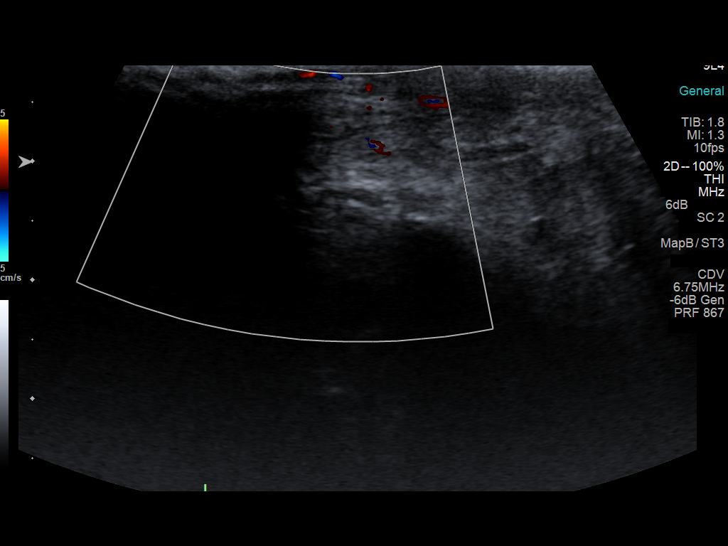
[im 43/52]
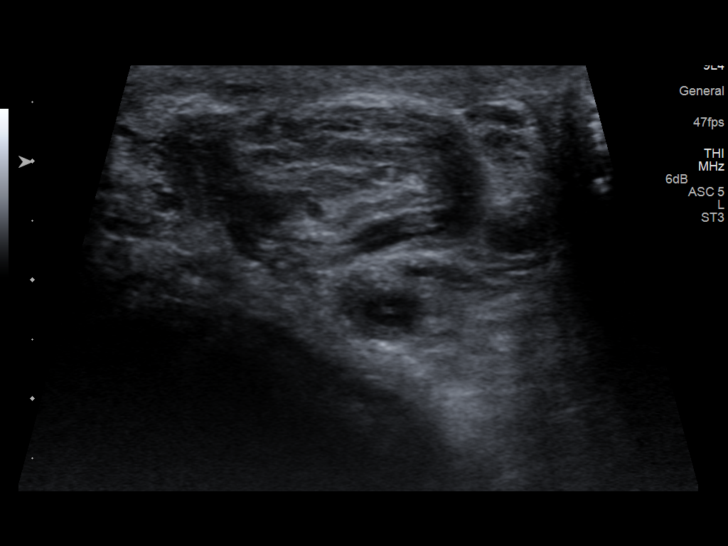
[im 47/52]
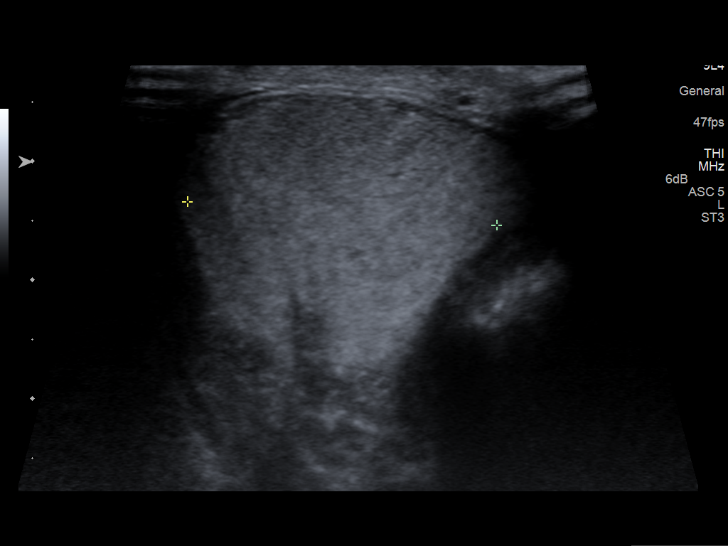
[im 52/52]
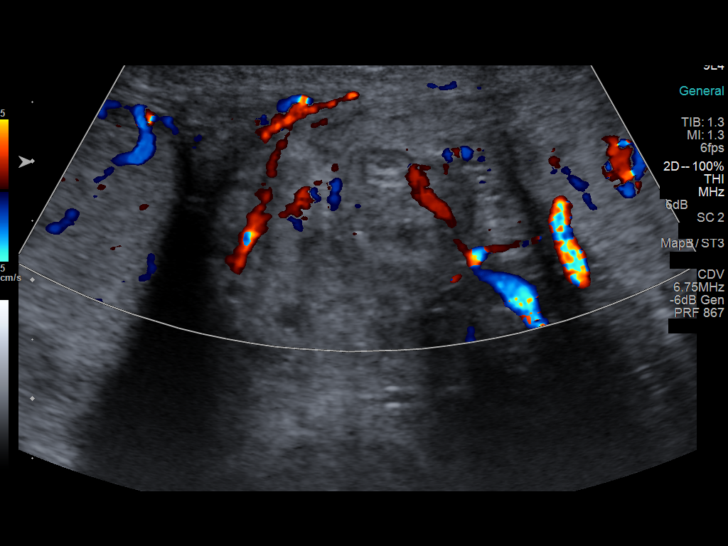

[13 of 25 positions shown; findings below may reference images not displayed]

FINDINGS: Right testicle

Measurements: 3.7 x 2.8 x 2.9 cm. No mass or microlithiasis
visualized.

Left testicle

Measurements: 4.1 x 1.8 x 2.6 cm. No mass or microlithiasis
visualized.

Right epididymis: Diffuse hyperemia and mild enlargement raises
concern for epididymitis.

Left epididymis: Diffuse hyperemia and mild enlargement raises
concern for epididymitis.

Hydrocele:  None visualized.

Varicocele:  None visualized.

Pulsed Doppler interrogation of both testes demonstrates normal low
resistance arterial and venous waveforms bilaterally.
IMPRESSION: No evidence for testicular torsion. Suspect bilateral epididymitis,
with diffuse hyperemia and mild enlargement of the epididymides, and
associated severe tenderness.

## 2016-01-08 MED ORDER — HYDRALAZINE HCL 20 MG/ML IJ SOLN: 5.0000 mg | Freq: Four times a day (QID) | INTRAMUSCULAR | Status: DC | PRN

## 2016-01-08 MED ORDER — ONDANSETRON HCL 4 MG/2ML IJ SOLN
INTRAMUSCULAR | Status: AC
  Filled 2016-01-08: qty 2

## 2016-01-08 MED ORDER — SODIUM CHLORIDE 0.9 % IV SOLN: 100.0000 mL | INTRAVENOUS | Status: DC | PRN

## 2016-01-08 MED ORDER — HEPARIN SODIUM (PORCINE) 1000 UNIT/ML DIALYSIS
1000.0000 [IU] | INTRAMUSCULAR | Status: DC | PRN
  Filled 2016-01-08: qty 1

## 2016-01-08 MED ORDER — LIDOCAINE HCL (PF) 1 % IJ SOLN
5.0000 mL | INTRAMUSCULAR | Status: DC | PRN
  Filled 2016-01-08: qty 5

## 2016-01-08 MED ORDER — WARFARIN SODIUM 7.5 MG PO TABS
7.5000 mg | ORAL_TABLET | Freq: Once | ORAL | Status: AC
  Administered 2016-01-08: 7.5 mg via ORAL
  Filled 2016-01-08: qty 1

## 2016-01-08 MED ORDER — HEPARIN SODIUM (PORCINE) 1000 UNIT/ML DIALYSIS
3000.0000 [IU] | INTRAMUSCULAR | Status: DC | PRN
  Filled 2016-01-08: qty 3

## 2016-01-08 MED ORDER — ISOSORBIDE MONONITRATE ER 60 MG PO TB24
60.0000 mg | ORAL_TABLET | Freq: Every day | ORAL | Status: DC
  Administered 2016-01-08 – 2016-01-09 (×2): 60 mg via ORAL
  Filled 2016-01-08 (×3): qty 1

## 2016-01-08 MED ORDER — ALTEPLASE 2 MG IJ SOLR: 2.0000 mg | Freq: Once | INTRAMUSCULAR | Status: DC | PRN

## 2016-01-08 MED ORDER — WARFARIN - PHARMACIST DOSING INPATIENT: Freq: Every day | Status: DC

## 2016-01-08 MED ORDER — PENTAFLUOROPROP-TETRAFLUOROETH EX AERO: 1.0000 "application " | INHALATION_SPRAY | CUTANEOUS | Status: DC | PRN

## 2016-01-08 MED ORDER — LIDOCAINE-PRILOCAINE 2.5-2.5 % EX CREA: 1.0000 "application " | TOPICAL_CREAM | CUTANEOUS | Status: DC | PRN

## 2016-01-08 MED ORDER — HYDRALAZINE HCL 50 MG PO TABS
100.0000 mg | ORAL_TABLET | Freq: Three times a day (TID) | ORAL | Status: DC
  Administered 2016-01-08 – 2016-01-10 (×5): 100 mg via ORAL
  Filled 2016-01-08 (×6): qty 2

## 2016-01-08 NOTE — Progress Notes (Signed)
Pt refusing to have lab work drawn. Refusing to have vital signs taken. Educated pt about importance of both of these. MD notified and coming to speak with patient. Leanne Chang, RN

## 2016-01-08 NOTE — Progress Notes (Signed)
Witnessed patient scratching off telemetry leads and then yells at staff when we have to come in, turn on the lights and replace the leads.  As per patient "ya'll leave me alone, I am trying to sleep"!

## 2016-01-08 NOTE — Care Management Note (Signed)
Case Management Note  Patient Details  Name: Frank [name] MRN: [id number] Date of Birth: Oct 08, 1965  Subjective/Objective:                 Non compliant HD patient with 12 admissions and 20 ED visits in the last 6 months. Refusing to answer CM questions or speak w/ CM for DC assistance. CM could not finish assessment. Per records patient with history of incarceration in March/ April of this year, frequent ED visits requesting IV pain medication, and history of leaving AMA. Patient being treated for HTN, and hyperkalemia with HD.   Action/Plan:   Expected Discharge Date:                  Expected Discharge Plan:  Home/Self Care  In-House Referral:     Discharge planning Services  CM Consult  Post Acute Care Choice:    Choice offered to:     DME Arranged:    DME Agency:     HH Arranged:    HH Agency:     Status of Service:  In process, will continue to follow  Medicare Important Message Given:    Date Medicare IM Given:    Medicare IM give by:    Date Additional Medicare IM Given:    Additional Medicare Important Message give by:     If discussed at [place] of Stay Meetings, dates discussed:    Additional Comments:  Carles Collet, RN 01/08/2016, 1:23 PM

## 2016-01-08 NOTE — Progress Notes (Signed)
Patient was asleep when entering the room. Woke pt and asked if he would like to be placed on CPAP. Pt said no and that he would do it himself when ready.

## 2016-01-08 NOTE — Progress Notes (Signed)
Interim Note:  Reported by nursing that patient is declining vitals check and ordered labs. Went to discuss with patient with attending Dr. Erin Hearing. From the conversation, patient does have competence as he was able to verbalize the consequences of having a possible MI and the consequence of not being able to check appropriate labs. He states he will allow blood work to be done now.   Update given to nursing.

## 2016-01-08 NOTE — Progress Notes (Signed)
ANTICOAGULATION CONSULT NOTE   Pharmacy Consult:  Coumadin Indication:  History of PE  Allergies  Allergen Reactions  . Butalbital-Apap-Caffeine Shortness Of Breath and Swelling    Swelling in throat  . Ferrlecit [Na Ferric Gluc Cplx In Sucrose] Shortness Of Breath, Swelling and Other (See Comments)    Swelling in throat  . Minoxidil Shortness Of Breath  . Darvocet [Propoxyphene N-Acetaminophen] Hives    Patient Measurements: Height = 74 inches Weight = 74.8 kg  Vital Signs: Temp: 97.1 F (36.2 C) (06/19 1050) Temp Source: Oral (06/19 1050) BP: 178/123 mmHg (06/19 1050) Pulse Rate: 91 (06/19 1050)  Labs:  Recent Labs  01/07/16 1605 01/07/16 1720 01/08/16 0733  HGB 8.6*  --  7.0*  HCT 27.0*  --  22.3*  PLT 184  --  185  LABPROT  --   --  19.4*  INR  --   --  1.63*  CREATININE  --  13.82* 14.62*  TROPONINI  --   --  0.07*    Estimated Creatinine Clearance: 6.5 mL/min (by C-G formula based on Cr of 14.62).   Medical History: Past Medical History  Diagnosis Date  . Hypertension   . Depression   . Complication of anesthesia     itching, sore throat  . Shortness of breath   . Anxiety   . CHF (congestive heart failure) ([place])   . Anemia   . Dialysis patient ([place])   . Type II diabetes mellitus (HCC)     No history per patient, but remains under history as A1c would not be accurate given on dialysis  . ESRD (end stage renal disease) ([place])     due to HTN per patient, followed at [facility], s/p failed kidney transplant - dialysis Tue, Th, Sat  . Renal insufficiency     Assessment: 79 YOM presented with abdominal pain, nausea and vomiting.  Pharmacy consulted to continue Coumadin from PTA for history of PE.  Patient reports taking Coumadin 90m daily PTA and his last dose was 2-3 days ago.    INR not obtained last evening as planned as pt refused to have any labs drawn. Pt finally agreed this am to have lab draw and INR resulted as 1.63 (subtherapeutic). Hgb 7 this  am with no overt sxs of bleeding.   PTA dose : 6 mg daily per pt   Goal of Therapy:  INR 2-3    Plan:  1. Warfarin 7.564mx 1 2. Daily PT / INR   AnVincenza HewsPharmD, BCPS 01/08/2016, 12:35 PM Pager: [phone]

## 2016-01-08 NOTE — Progress Notes (Signed)
CRITICAL VALUE ALERT  Critical value received:  Potassium 6.2  Date of notification:  01/08/2016  Time of notification:  0830  Critical value read back:Yes.    Nurse who received alert:  Leonidas Romberg, RN  MD notified (1st page):  Dr. Florene Glen  Time of first page:  [phone]  MD notified (2nd page):  Time of second page:  Responding MD:  Dr. Florene Glen  Time MD responded:  [phone]

## 2016-01-08 NOTE — Progress Notes (Signed)
Lab came to draw blood yet again.  Patient refuses to let lab draw blood.  Patient has been educated and reminded of the importance of being compliant in order to have the care needed to get better.

## 2016-01-08 NOTE — Progress Notes (Signed)
[name]: [phone]  Patient name: Frank [name] Medical record number: [id number] Date of birth: 05-26-1966 Age: 50 y.o. Gender: male  Primary Care Provider: [name] Patient Consultants: Neprology Code Status: FULL  Pt Overview and Major Events to Date:  6/18: admit  Assessment and Plan: Frank [name] is a 50 y.o. male presenting with chief complaint of dyspnea. PMH is significant for HTN, Depression, Anxiety, CHF, DM?, ESRD s/p failed transplant, anemia, and L renal mass, hx of PE on coumadin.  #Dyspnea, improving per pt: Likely 2/2 fluid overload. Mild interstitial edema and cardiomegaly seen on CXR. Had shortened dialysis session 6/17. Trace edema of LEs on exam. Last ECHO 10/31/15 showed EF of 35-40%, LV severe concentric hypertrophy, RVH, and small pericardial effusion. - Trend Tropoinin 0.07 >  - Monitor oxygen saturation with vital signs - Ordered home xopenex q6h prn - Ordered home xanax 0.5 mg BID prn in case of anxiety component - Dialysis scheduled for 6/19 (a day earlier than normal)  Chest pain, resolved: Most likely MSK in nature. Reproducible on exam.  - Monitor on telemetry in setting of electrolyte abnormalities.  - Trend troponin: 0.07 - a.m. EKG  #Abdominal Pain, chronic: Lipase 33. LFTs wnl.  Recent CT abdomen 01/01/16 with trace ascites around liver and stomach; left renal mass concerning for renal cell carcinoma; and bilateral rental atrophy. Outside records show patient has been requesting IV pain medication at other EDs. He continued to request IV pain medications, saying he would not be able to keep anything else down, but agreed to taking oral pain medication given with zofran to help with nausea.  - Ordered home flexeril, gabapentin, percocet (will try to wean narcotic) - Zofran IV and PO prn for nausea. Recheck QTc on EKG (was prolonged at 494 on admission) - Hepatitis B panel ordered   #HFrEF: last  EF of 35-40% in April of 2017. Unknown dry weight. No overly fluid overloaded on exam. Interstitial edema on chest x-ray. BNP likely to be abnormal since patient is ESRD on dialysis. - Coreg, Hydralazine, Imdur - holding ACE due to hyperkalemia  - daily weights - in/out  #Hyperkalemia: To 6.6 in the ED > 6.2. No peaked T waves or ST changes on EKG.  - s/p Kayexalate 30 g given in the ED, per Nephrologist Dr. Jonnie Finner - HD this AM   #ESRD: Above last reported EDW of 69-70 kg at 74.844 kg on admission. Had shortened session of HD 6/17 due to muscles cramps. Usually receives dialysis TuThSa - Plan for dialysis 6/19 - Nephrology consulted, appreciate recommendations.  - [place] I/Os - Daily weights - Continue renvela, sensipar, renavit  #Normocytic Anemia: Hgb 7 from 8.6. Baseline seems to be 7.5-8. No clinical signs of bleeding.  - will monitor   #HTN:  Fluid overload and/or medication nonadherence thought to be contributing. Restarted BP meds with Dr. Barkley Bruns approval.  - Home amlodipine 10 mg daily, carvedilol 12.5 mg BID - Home imdur at 60 mg daily (down from 90 mg daily) - holding Lisinopril in the setting of hyperkalemia - Hydralazine 100 mg q8h  - Hydralazine 26m q 4hr PRN  #OSA - Ordered CPAP QHS  #Anxiety - Ordered home xanax 0.5 mg PO BID prn - Ordered home hydroxyzine 10 mg TID prn  #Hx of PE - Coumadin per pharmacy consult  #Social: [name] - SW consulted   FEN/GI: Renal diet, SLIV Prophylaxis: Coumadin per pharmacy consult  Disposition:  pending evaluation   Subjective:  Patient reports his breathing is a little better but reports of dyspnea with ambulation; this is new for the "past few days". He also reports continued abdominal pain mainly in the lower suprapubic region. Reports this is constant and worsens with ambulation; this pain has been present for 2-3 months. He is unable to recall which medications he is on for HTN. Reports he needs IV pain  medications. Had BM this AM  Objective: Temp:  [97.5 F (36.4 C)-97.9 F (36.6 C)] 97.9 F (36.6 C) (06/19 0500) Pulse Rate:  [58-76] 72 (06/19 0500) Resp:  [13-25] 18 (06/19 0500) BP: (169-198)/(113-135) 190/115 mmHg (06/19 0500) SpO2:  [92 %-100 %] 100 % (06/19 0500) Physical Exam: General: NAD, in HD Cardiovascular: RRR, no m/r/g, no JVD Respiratory: no increased wob, CTAB Abdomen: soft, + BS, does not seem to have pain with palpation of stethoscope, but reports of tenderness of suprapubic region, RUQ, and epigastric region. No guarding or rebound.   Extremities: no LE swelling noted, DP pulses intact bilaterally   Laboratory:  Recent Labs Lab 01/07/16 1605  WBC 6.0  HGB 8.6*  HCT 27.0*  PLT 184    Recent Labs Lab 01/07/16 1720  NA 134*  K 6.6*  CL 100*  CO2 19*  BUN 70*  CREATININE 13.82*  CALCIUM 9.3  PROT 8.4*  BILITOT 0.9  ALKPHOS 117  ALT 13*  AST 49*  GLUCOSE 94    Imaging/Diagnostic Tests: FINDINGS: Central venous catheter tip projects over the superior vena cava. Stable marked cardiomegaly. Pulmonary vascular redistribution and bilateral perihilar interstitial pulmonary opacities. Mild pleural thickening. No definite pleural effusion. Regional skeleton is unremarkable.  IMPRESSION: Cardiomegaly and mild interstitial pulmonary edema.   From Prior ED Visit: CT abdomen and pelvis 6/12 IMPRESSION: 1. Trace ascites noted tracking about the liver and stomach. 2. 3.7 cm heterogeneous left renal mass at the atrophic native left kidney has decreased mildly in size since the prior study, but remains concerning for renal cell carcinoma. 3. Severe bilateral native renal atrophy. Left iliac transplant kidney is grossly unremarkable in appearance, though not well assessed. 4. Diffuse calcification along the abdominal aorta and its branches. Smiley Houseman, MD 01/08/2016, 6:43 AM PGY-1, [place] Intern pager: [phone],  text pages welcome

## 2016-01-08 NOTE — Procedures (Signed)
Presented urgently for HD.  BP severely elevated with diastolic of 465.  Pt alert and cooperative. Using [facility]. Goal 4000cc.  Needs volume removal and regular dialysis attendance. Halee Glynn C

## 2016-01-09 ENCOUNTER — Encounter (HOSPITAL_COMMUNITY): Payer: Self-pay | Admitting: General Practice

## 2016-01-09 DIAGNOSIS — R7989 Other specified abnormal findings of blood chemistry: Secondary | ICD-10-CM

## 2016-01-09 DIAGNOSIS — I429 Cardiomyopathy, unspecified: Secondary | ICD-10-CM

## 2016-01-09 DIAGNOSIS — R109 Unspecified abdominal pain: Secondary | ICD-10-CM

## 2016-01-09 DIAGNOSIS — G8929 Other chronic pain: Secondary | ICD-10-CM

## 2016-01-09 DIAGNOSIS — I428 Other cardiomyopathies: Secondary | ICD-10-CM

## 2016-01-09 DIAGNOSIS — I5042 Chronic combined systolic (congestive) and diastolic (congestive) heart failure: Secondary | ICD-10-CM

## 2016-01-09 LAB — HEMOGLOBIN AND HEMATOCRIT, BLOOD
HCT: 24.3 % — ABNORMAL LOW (ref 39.0–52.0)
Hemoglobin: 7.7 g/dL — ABNORMAL LOW (ref 13.0–17.0)

## 2016-01-09 LAB — GLUCOSE, CAPILLARY
GLUCOSE-CAPILLARY: 159 mg/dL — AB (ref 65–99)
Glucose-Capillary: 100 mg/dL — ABNORMAL HIGH (ref 65–99)

## 2016-01-09 LAB — CBC
HEMATOCRIT: 21.7 % — AB (ref 39.0–52.0)
HEMOGLOBIN: 6.9 g/dL — AB (ref 13.0–17.0)
MCH: 26.5 pg (ref 26.0–34.0)
MCHC: 31.8 g/dL (ref 30.0–36.0)
MCV: 83.5 fL (ref 78.0–100.0)
Platelets: 164 10*3/uL (ref 150–400)
RBC: 2.6 MIL/uL — ABNORMAL LOW (ref 4.22–5.81)
RDW: 16.2 % — AB (ref 11.5–15.5)
WBC: 5.4 10*3/uL (ref 4.0–10.5)

## 2016-01-09 LAB — HEPATITIS B SURFACE ANTIGEN: Hepatitis B Surface Ag: NEGATIVE

## 2016-01-09 LAB — BASIC METABOLIC PANEL
Anion gap: 11 (ref 5–15)
BUN: 45 mg/dL — AB (ref 6–20)
CALCIUM: 8.7 mg/dL — AB (ref 8.9–10.3)
CHLORIDE: 98 mmol/L — AB (ref 101–111)
CO2: 24 mmol/L (ref 22–32)
CREATININE: 9.5 mg/dL — AB (ref 0.61–1.24)
GFR calc Af Amer: 7 mL/min — ABNORMAL LOW (ref >60)
GFR calc non Af Amer: 6 mL/min — ABNORMAL LOW (ref >60)
GLUCOSE: 102 mg/dL — AB (ref 65–99)
Potassium: 4.6 mmol/L (ref 3.5–5.1)
Sodium: 133 mmol/L — ABNORMAL LOW (ref 135–145)

## 2016-01-09 LAB — PROTIME-INR
INR: 1.43 (ref 0.00–1.49)
Prothrombin Time: 17.5 seconds — ABNORMAL HIGH (ref 11.6–15.2)

## 2016-01-09 LAB — HEPATITIS B SURFACE ANTIBODY,QUALITATIVE: Hep B S Ab: REACTIVE

## 2016-01-09 LAB — HEPATITIS B CORE ANTIBODY, TOTAL: HEP B C TOTAL AB: NEGATIVE

## 2016-01-09 LAB — PREPARE RBC (CROSSMATCH)

## 2016-01-09 LAB — TROPONIN I: Troponin I: 0.1 ng/mL — ABNORMAL HIGH (ref <0.031)

## 2016-01-09 MED ORDER — SODIUM CHLORIDE 0.9 % IV SOLN
Freq: Once | INTRAVENOUS | Status: AC
  Administered 2016-01-09: 09:00:00 via INTRAVENOUS

## 2016-01-09 MED ORDER — OXYCODONE-ACETAMINOPHEN 5-325 MG PO TABS
1.0000 | ORAL_TABLET | Freq: Four times a day (QID) | ORAL | Status: DC | PRN
  Administered 2016-01-09 – 2016-01-10 (×3): 1 via ORAL
  Filled 2016-01-09 (×2): qty 1

## 2016-01-09 MED ORDER — OXYCODONE-ACETAMINOPHEN 5-325 MG PO TABS
ORAL_TABLET | ORAL | Status: AC
  Filled 2016-01-09: qty 1

## 2016-01-09 MED ORDER — ISOSORBIDE MONONITRATE ER 60 MG PO TB24: 60.0000 mg | ORAL_TABLET | Freq: Every day | ORAL | Status: DC

## 2016-01-09 MED ORDER — CARVEDILOL 12.5 MG PO TABS: 12.5000 mg | ORAL_TABLET | Freq: Two times a day (BID) | ORAL | Status: DC

## 2016-01-09 MED ORDER — POLYETHYLENE GLYCOL 3350 17 G PO PACK
17.0000 g | PACK | Freq: Every day | ORAL | Status: DC
  Filled 2016-01-09: qty 1

## 2016-01-09 MED ORDER — WARFARIN SODIUM 7.5 MG PO TABS: 7.5000 mg | ORAL_TABLET | Freq: Once | ORAL | Status: DC

## 2016-01-09 NOTE — Procedures (Signed)
Tolerating HD. Pt goal 3500cc. BP elevated.  He has been encouraged to complete treatment. Shada Nienaber C
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
## 2016-01-09 NOTE — Progress Notes (Signed)
CRITICAL VALUE ALERT  Critical value received:  Hgb 6.9  Date of notification:  01/09/16  Time of notification:  0715  Critical value read back:Yes.    Nurse who received alert:  Richarda Overlie  MD notified (1st page):  Su Ley  Time of first page:  0730  MD notified (2nd page):  Time of second page:  Responding MD:  Felts  Time MD responded:  [phone]

## 2016-01-09 NOTE — Discharge Summary (Signed)
[facility] Discharge Summary  Patient name: Frank [name] Medical record number: [id number] Date of birth: June 19, 1966 Age: 50 y.o. Gender: male Date of Admission: 01/07/2016  Date of Discharge: 01/09/16 Admitting Physician: [name], MD  Primary Care Provider: [name] Patient Consultants: Cardiology, Nephrology  Indication for Hospitalization: dyspnea, chest pain, hyperkalemia  Discharge Diagnoses/Problem List:  Dyspnea 2/2 fluid overload in the setting of shortened dialysis session ACS rule out Chronic HFrEF Hyperkalemia ESRD Normocytic Anemia HTN OSA Anxiety  Hx of PE  Disposition: home  Discharge Condition: stable  Discharge Exam:  GEN: NAD CV: RRR, no murmurs, rubs, or gallops PULM: CTAB, normal effort ABD: Soft, reports of some tenderness to palpation on epigastric region and central abdomen, no guarding or rebound, + BS; patient is able to shift around in bed without any difficulty  SKIN: No rash or cyanosis; warm and well-perfused EXTR: No lower extremity edema or calf tenderness; dry skin on lower extremities  NEURO: Awake, alert, no focal deficits grossly, normal speech  Brief Hospital Course:  Frank [name] is a 50 yo male who presented with dyspnea, chest pain and abdominal pain. HTN, Depression, Anxiety, CHF, ESRD s/p failed transplant, anemia, and L renal mass, hx of PE on coumadin.  Below is a hospital course for the patient. Patient was to be discharged on the evening of 6/20 after receiving dialysis. However, patient refused to leave that evening due to feeling tired despite him reporting that he has a surgery planned for left nephrectomy on 6/21 at [facility].   Dyspnea: Patient reported symptoms started 1 day prior to presentation. He initially reported dyspnea at rest and with exertion. This was thought to be due to fluid overload in the setting of a shortened dialysis session the Saturday prior to presentation due to muscle  cramps. CXR on admission showed cardiomegaly and mild interstitial pulmonary edema. Patient had normal oxygen saturations on room air. Patient had dialysis on Monday (extra day) and his symptoms resolved.      Chest Pain:  Patient reported of chest pain which was most likely MSK in nature as it was reproducible on exam on the day of admission. This self resolved shortly after admission. Due to his history, ACS rule out was done. EKG did not show ST or T wave changes. Troponins were trended: 0.07 >0.07 >0.08 >0.1. In the setting of ESRD this was thought to be unremarkable. Cardiology did evaluate patient; per chart review, patient had a recent cth in 06/2105 showing only minimal CAD therefore cardiology recommended no further ischemic evaluation at this time. He was continued on Beta Blocker and Statin.   Hyperkalemia, ESRD with failed renal transplant: Potassium was found to be 6.6 in the ED. EKG did not show peaked T waves or ST changes. This was discussed with Dr. Jonnie Finner, nephrology, who recommended Kayexalate,then HD in the AM. Potassium decreased after HD to 4. On day of discharge K was 4.6. Due to concern with compliance with HD and the increased risk of Hyperkalemia with the medication, Lisinopril was discontinued. Patient did receive dialysis prior to discharge (on his regular schedule).   HTN: Patient was noted to have hypertensive urgency with BP in 194/135 in the ED. Per chart review of ED visits, patient's blood pressure had been chronically elevated. During medication reconciliation, patient reported he was not taking blood pressure medications: Coreg, Imdur, Lisinopril, Amlodipine, Hydralazine. Patient was restarted on Amlodipine, Coreg, Imdur (at lower dose: 73m instead of 974m, and Hydralazine.  His blood pressures improved but remained elevated. Treatment team did not want to drastically decrease blood pressure to normal levels quickly due to possible adverse effects of this. Patient  reported to team that he does have these tablets at home and declined any refills. Team reviewed instructions regarding which medications patient should take for HTN prior to discharge.   Abdominal Pain, Chronic: Patient noted this has been occurring for the past few months. Patient reports, this is due to his known mass on his L kidney concerning for RCC for which he has surgery scheduled on 6/21. Per chart review, patient sees urology for this pain; he additionally uses narcotic pain medications for this pain. Per note from 11/2015, there was concern for dependence of narcotic. Patient was given medications to last until the planned radical nephrectomy. Additionally, there was discussion about referring to patient to [facility]. Per note, patient understands to not receive narcotics from any other physician.  On admission, his liver function tests were normal. Lipase was also normal. He was afebrile and without leukocytosis. Recent CT abdomen taken on 01/01/16 showed trace ascites around liver and stomach, left renal mass concerning for renal cell carcinoma. Patient was not discharged with a new prescription for pain medications.   Normocytic Anemia:  Patient's baseline hemoglobin seems to be 7.5-8 recently. On morning labs on day of discharge, hemoglobin was 6.9 from 7 from day prior. He was transfused with 1 unit of pRBC. There were no clinical signs or symptoms of acute bleed. Patient would likely benefit from EPO in the setting of ESRD.   Hx of PE, on Coumadin:  Coumadin was managed by pharmacy. On day of discharge patient was recommended to take home dose of Coumadin and follow up with dialysis clinic for titration. INR was 1.43 on day of discharge. Patient reported he missed a few doses prior to presentation.   Issues for Follow Up:  - follow up BP; will likely need titration - patient to have surgery (nephrectomy) on 6/21  Significant Procedures: none  Significant Labs and Imaging:    Recent Labs Lab 01/07/16 1605 01/08/16 0733 01/09/16 0447 01/09/16 1645  WBC 6.0 5.4 5.4  --   HGB 8.6* 7.0* 6.9* 7.7*  HCT 27.0* 22.3* 21.7* 24.3*  PLT 184 185 164  --     Recent Labs Lab 01/07/16 1720 01/08/16 0733 01/08/16 1359 01/09/16 0447  NA 134* 135 136 133*  K 6.6* 6.2* 4.0 4.6  CL 100* 101 98* 98*  CO2 19* 19* 27 24  GLUCOSE 94 65 102* 102*  BUN 70* 82* 33* 45*  CREATININE 13.82* 14.62* 7.77* 9.50*  CALCIUM 9.3 8.9 8.9 8.7*  ALKPHOS 117  --   --   --   AST 49*  --   --   --   ALT 13*  --   --   --   ALBUMIN 3.1*  --   --   --    Troponin: 0.07 > 0.07 > 0.08 >0.1 Hepatitis B core antibody negative Hep B surface antibody positive (immune) Hep B surface antigen negative  CXR 6/18:  FINDINGS: Central venous catheter tip projects over the superior vena cava. Stable marked cardiomegaly. Pulmonary vascular redistribution and bilateral perihilar interstitial pulmonary opacities. Mild pleural thickening. No definite pleural effusion. Regional skeleton is unremarkable.  IMPRESSION: Cardiomegaly and mild interstitial pulmonary edema.  CT Abdomen from recent ED Visit 6/12:  FINDINGS: The visualized lung bases are clear.  Evaluation is suboptimal due to significantly delayed  contrast enhancement on this study.  Trace ascites is noted tracking about the liver and stomach.  The liver and spleen are unremarkable in appearance. Trace fluid about the gallbladder is nonspecific in the presence of ascites. The pancreas and adrenal glands are unremarkable.  There is a heterogeneous mass arising at the left kidney, measuring 3.7 cm, again concerning for renal cell carcinoma, though it has decreased mildly in size since the prior study. Severe bilateral native renal atrophy is noted, with mild perinephric stranding.  The patient's left iliac transplant kidney is grossly unremarkable in appearance, aside from scattered vascular calcification,  though difficult to fully assess.  The small bowel is unremarkable in appearance. The stomach is within normal limits. No acute vascular abnormalities are seen. Diffuse calcification is seen along the abdominal aorta and its branches.  The appendix is not definitely characterized; there is no evidence for appendicitis. The colon is grossly unremarkable in appearance.  The bladder is decompressed and not well assessed. The prostate remains normal in size. No inguinal lymphadenopathy is seen.  No acute osseous abnormalities are identified.  IMPRESSION: 1. Trace ascites noted tracking about the liver and stomach. 2. 3.7 cm heterogeneous left renal mass at the atrophic native left kidney has decreased mildly in size since the prior study, but remains concerning for renal cell carcinoma. 3. Severe bilateral native renal atrophy. Left iliac transplant kidney is grossly unremarkable in appearance, though not well assessed. 4. Diffuse calcification along the abdominal aorta and its branches.  Results/Tests Pending at Time of Discharge:   Discharge Medications:    Medication List    STOP taking these medications        cloNIDine 0.3 mg/24hr patch  Commonly known as:  CATAPRES - Dosed in mg/24 hr     doxazosin 2 MG tablet  Commonly known as:  CARDURA     doxercalciferol 4 MCG/2ML injection  Commonly known as:  HECTOROL     hydrocerin Crea     HYDROcodone-acetaminophen 5-325 MG tablet  Commonly known as:  NORCO     ibuprofen 600 MG tablet  Commonly known as:  ADVIL,MOTRIN     lisinopril 20 MG tablet  Commonly known as:  PRINIVIL,ZESTRIL     lisinopril 40 MG tablet  Commonly known as:  PRINIVIL,ZESTRIL     metoCLOPramide 10 MG tablet  Commonly known as:  REGLAN     ondansetron 4 MG tablet  Commonly known as:  ZOFRAN     ondansetron 8 MG disintegrating tablet  Commonly known as:  ZOFRAN ODT     traMADol 50 MG tablet  Commonly known as:  ULTRAM      TAKE  these medications        allopurinol 100 MG tablet  Commonly known as:  ZYLOPRIM  Take 100 mg by mouth daily.     ALPRAZolam 0.5 MG tablet  Commonly known as:  XANAX  Take 1 tablet (0.5 mg total) by mouth 2 (two) times daily as needed for anxiety.     amLODipine 10 MG tablet  Commonly known as:  NORVASC  Take 10 mg by mouth daily.     atorvastatin 40 MG tablet  Commonly known as:  LIPITOR  Take 1 tablet (40 mg total) by mouth daily at 6 PM.     carvedilol 12.5 MG tablet  Commonly known as:  COREG  Take 1 tablet (12.5 mg total) by mouth 2 (two) times daily with a meal.     cinacalcet 30 MG tablet  Commonly known as:  SENSIPAR  Take 30 mg by mouth daily.     cyclobenzaprine 10 MG tablet  Commonly known as:  FLEXERIL  Take 1 tablet (10 mg total) by mouth 3 (three) times daily as needed for muscle spasms.     docusate sodium 100 MG capsule  Commonly known as:  COLACE  Take 1 capsule (100 mg total) by mouth 2 (two) times daily.     gabapentin 100 MG capsule  Commonly known as:  NEURONTIN  Take 100 mg by mouth 3 (three) times daily.     hydrALAZINE 100 MG tablet  Commonly known as:  APRESOLINE  Take 100 mg by mouth every 8 (eight) hours.     hydrOXYzine 10 MG tablet  Commonly known as:  ATARAX/VISTARIL  Take 1 tablet (10 mg total) by mouth 3 (three) times daily as needed for itching.     hyoscyamine 0.125 MG SL tablet  Commonly known as:  LEVSIN/SL  Place 1 tablet (0.125 mg total) under the tongue every 4 (four) hours as needed.     isosorbide mononitrate 60 MG 24 hr tablet  Commonly known as:  IMDUR  Take 1 tablet (60 mg total) by mouth daily.     levalbuterol 45 MCG/ACT inhaler  Commonly known as:  XOPENEX HFA  Inhale 2 puffs into the lungs every 6 (six) hours as needed for wheezing or shortness of breath.     multivitamin Tabs tablet  Take 1 tablet by mouth at bedtime.     omeprazole 20 MG capsule  Commonly known as:  PRILOSEC  Take 20 mg by mouth daily.      oxyCODONE-acetaminophen 5-325 MG tablet  Commonly known as:  PERCOCET/ROXICET  Take 1-2 tablets by mouth every 6 (six) hours as needed for severe pain.     predniSONE 10 MG tablet  Commonly known as:  DELTASONE  Take 10 mg by mouth daily with breakfast.     sevelamer carbonate 800 MG tablet  Commonly known as:  RENVELA  Take 800-1,600 mg by mouth 3 (three) times daily with meals. 2 tabs three times daily with meals, and 1 tablet with snacks     warfarin 6 MG tablet  Commonly known as:  COUMADIN  Take 6 mg by mouth daily. Take along with a 372m tablet to make total dose of 795mPer pharmacy records     warfarin 1 MG tablet  Commonly known as:  COUMADIN  Take 1 mg by mouth daily. Take along with the 72m72mablet to make total dose of 7mg49m     Discharge Instructions: Please refer to Patient Instructions section of EMR for full details.  Patient was counseled important signs and symptoms that should prompt return to medical care, changes in medications, dietary instructions, activity restrictions, and follow up appointments.   Follow-Up Appointments: Follow-up Information    Follow up with [facility]:  Please make a hospital follow up appointment in 1 week. Please try to establish care here so that you have a general doctor to manage your medical diagnoses   Contact information:   [phone]      KaniSmiley Houseman 01/09/2016, 6:54 PM PGY-1, [facility]

## 2016-01-09 NOTE — Progress Notes (Signed)
ANTICOAGULATION CONSULT NOTE   Pharmacy Consult:  Coumadin Indication:  History of PE  Allergies  Allergen Reactions  . Butalbital-Apap-Caffeine Shortness Of Breath and Swelling    Swelling in throat  . Ferrlecit [Na Ferric Gluc Cplx In Sucrose] Shortness Of Breath, Swelling and Other (See Comments)    Swelling in throat  . Minoxidil Shortness Of Breath  . Darvocet [Propoxyphene N-Acetaminophen] Hives    Patient Measurements: Height = 74 inches Weight = 74.8 kg  Vital Signs: Temp: 97.9 F (36.6 C) (06/20 0547) Temp Source: Oral (06/20 0547) BP: 154/103 mmHg (06/20 0948) Pulse Rate: 89 (06/20 0948)  Labs:  Recent Labs  01/07/16 1605  01/08/16 0733 01/08/16 1359 01/08/16 1920 01/09/16 0447  HGB 8.6*  --  7.0*  --   --  6.9*  HCT 27.0*  --  22.3*  --   --  21.7*  PLT 184  --  185  --   --  164  LABPROT  --   --  19.4*  --   --  17.5*  INR  --   --  1.63*  --   --  1.43  CREATININE  --   < > 14.62* 7.77*  --  9.50*  TROPONINI  --   < > 0.07* 0.07* 0.08* 0.10*  < > = values in this interval not displayed.  Estimated Creatinine Clearance: 9.9 mL/min (by C-G formula based on Cr of 9.5).   Medical History: Past Medical History  Diagnosis Date  . Hypertension   . Depression   . Complication of anesthesia     itching, sore throat  . Shortness of breath   . Anxiety   . CHF (congestive heart failure) ([place])   . Anemia   . Dialysis patient ([place])   . Type II diabetes mellitus ([facility])     No history per patient, but remains under history as A1c would not be accurate given on dialysis  . ESRD (end stage renal disease) ([place])     due to HTN per patient, followed at [facility], s/p failed kidney transplant - dialysis Tue, Th, Sat  . Renal insufficiency   . Hyperkalemia 12/2015    Assessment: 49 YOM presented with abdominal pain, nausea and vomiting.  Pharmacy consulted to continue Coumadin from PTA for history of PE.  Patient reports taking Coumadin 7 mg daily PTA and his  last dose was 2-3 days ago.   INR this am is SUBtherapeutic (1.43 <  1.63, goal 2-3) after restarting warfarin on 6/19. Hgb 6.9 this morning with no sxs of bleeding, to get transfused 1 unit PRBC.   PTA dose : 7 mg daily per pt  Goal of Therapy:  INR 2-3    Plan:  1. Warfarin 7.50m x 1 (conservative dose in light of hgb) 2. Daily PT / INR   AVincenza Hews PharmD, BCPS 01/09/2016, 10:19 AM Pager: [phone]

## 2016-01-09 NOTE — Progress Notes (Signed)
Chosen will be discharged today and team has requested inpatient HD. Have ordered. Frank [name]

## 2016-01-09 NOTE — Progress Notes (Signed)
Refusing to leave hospital for transportation reasons despite nurse offering to call taxi. Reports he is too tired after dialysis to leave. Discussed that there is no medical reason to keep him. Insurance will likely not pay for hospital stay. He states, "That is fine. You can bill me." Surgery scheduled for tomorrow. Unsure if discharge can occur early enough for him to get to surgery and he acknowledges this. Refusing to walk. If ambulatory, will consider calling security to remove from building and escort to taxi.  Dr. Gerlean Ren 01/09/2016, 11:33 PM

## 2016-01-09 NOTE — Progress Notes (Signed)
Patient states he can place self on and off CPAP and will call if needing help.

## 2016-01-09 NOTE — Progress Notes (Signed)
[name]: [phone]  Patient name: Frank [name] Medical record number: [id number] Date of birth: 08/26/1965 Age: 50 y.o. Gender: male  Primary Care Provider: [name] Patient Consultants: Neprology Code Status: FULL  Pt Overview and Major Events to Date:  6/18: admit 6/19: early HD: -4L out  Assessment and Plan: Frank [name] is a 50 y.o. male presenting with chief complaint of dyspnea. PMH is significant for HTN, Depression, Anxiety, CHF, DM?, ESRD s/p failed transplant, anemia, and L renal mass, hx of PE on coumadin.  #Dyspnea, improving per pt: Likely 2/2 fluid overload due to shortened dialysis session prior to presentation. Mild interstitial edema and cardiomegaly seen on CXR on admission.  Last ECHO 10/31/15 showed EF of 35-40%, LV severe concentric hypertrophy, RVH, and small pericardial effusion.  - Trend Tropoinin 0.07 > 0.07 >0.08 > 0.1 ( likely demand ischemia in the setting of fluid overload) - Monitor oxygen saturation with vital signs - home xopenex q6h prn - home xanax 0.5 mg BID prn in case of anxiety component  #Chest pain, resolved: Most likely MSK in nature. No chest pain this AM, however troponin mildly trending up but likely due to demand ischemia.Did have myoview which was read as high risk with inferolateral ischemia in 2016. Had [place] in 08/2014: LAD with only mild luminal irregularity noted, Left Circumflex: tubular eccentric 30-40% stenosis in the distal portion of main OM branch but no obstructive disease noted. Cardiology at that point recommended medical management.  - Monitor on telemetry in setting of electrolyte abnormalities.  - Trend troponin: 0.07 > 0.07 >0.08 > 0.1 - EKG ordered - cardiology consulted in the setting of mildly elevated troponins and also in the setting of dyspnea on exertion, appreciate reccs   #Abdominal Pain, acute on chronic: Lipase 33. LFTs wnl.  Recent CT abdomen 01/01/16 with  trace ascites around liver and stomach; left renal mass concerning for renal cell carcinoma; and bilateral rental atrophy. Patient reports epigastric pain is new. - already on PPI - Ordered home flexeril, gabapentin, percocet (weaned to 5-244m q 6 PRN from 2 tabs) - Zofran IV and PO prn for nausea. Recheck QTc on EKG (was prolonged at 494 on admission) - Hepatitis B panel ordered: Hep B core antibody negative, hep B surface antibody (reactive- immune), Hep B surface antigen (in process)  #HFrEF: last EF of 35-40% in April of 2017. Unknown dry weight. No overly fluid overloaded on exam. Interstitial edema on chest x-ray. BNP likely to be abnormal since patient is ESRD on dialysis. - Coreg, Hydralazine, Imdur - holding ACE due to hyperkalemia  - daily weights: 167lb < 160lb < 169lb - in/out: -3 L total  #Hyperkalemia, resolved:   #ESRD: Above last reported EDW of 69-70 kg at 74.844 kg on admission. Had shortened session of HD 6/17 due to muscles cramps. [facility], appreciate recommendations.  - Continue renvela, sensipar, renavit  #Normocytic Anemia: Hgb 6.9 < 7 < 8.6. Baseline seems to be 7.5-8. No clinical signs of bleeding.  - will monitor  - will order 1 unit pRBC (patient agrees to transfusion)  - will likely benefit from EPO   #HTN: Fluid overload and/or medication nonadherence thought to be contributing. Still elevated but improved slightly- 151-161/102-114.  - amlodipine 10 mg daily, carvedilol 12.5 mg BID - imdur at 60 mg daily (down from 90 mg daily) - holding Lisinopril in the setting of hyperkalemia - Hydralazine 100  mg q8h  - Hydralazine 8m q 4hr PRN SBP >180 and/or DBP > 110  #OSA - Ordered CPAP QHS  #Anxiety - Ordered home xanax 0.5 mg PO BID prn - Ordered home hydroxyzine 10 mg TID prn  #Hx of PE - Coumadin per pharmacy consult - INR is subtheraputic this AM at 1.43  #Social: [name] - SW consulted   FEN/GI:  Renal diet, SLIV Prophylaxis: Coumadin per pharmacy consult  Disposition: pending evaluation   Subjective:  Reports of no shortness of breath as rest. He still has some dyspnea on exertion that has overall improved; this is new per patient and began the day prior to admission. Reports that he continues to have abdominal pain which he attributes to his renal cell carcinoma. He also notes of epigastric pain that started a few days ago. This pain is constant and he is unsure of aggravating/alleviating factors. No history of gastritis, PUD, GER. Normal BM yesterday morning, but states he may be a little constipated  Objective: Temp:  [96.9 F (36.1 C)-97.9 F (36.6 C)] 97.9 F (36.6 C) (06/20 0547) Pulse Rate:  [79-95] 85 (06/20 0547) Resp:  [14-22] 18 (06/20 0547) BP: (151-194)/(99-131) 161/103 mmHg (06/20 0547) SpO2:  [95 %-98 %] 95 % (06/20 0547) Weight:  [72.8 kg (160 lb 7.9 oz)-76.8 kg (169 lb 5 oz)] 76.114 kg (167 lb 12.8 oz) (06/20 0547) Physical Exam: General: NAD, alert and oriented  Cardiovascular: RRR, no m/r/g, no JVD Respiratory: no increased wob, CTAB Abdomen: soft, + BS, epigastric tenderness to palpation as well as RUQ and central abdomen. No guarding or rebound.   Extremities: no LE swelling noted, DP pulses intact bilaterally   Laboratory:  Recent Labs Lab 01/07/16 1605 01/08/16 0733  WBC 6.0 5.4  HGB 8.6* 7.0*  HCT 27.0* 22.3*  PLT 184 185    Recent Labs Lab 01/07/16 1720 01/08/16 0733 01/08/16 1359  NA 134* 135 136  K 6.6* 6.2* 4.0  CL 100* 101 98*  CO2 19* 19* 27  BUN 70* 82* 33*  CREATININE 13.82* 14.62* 7.77*  CALCIUM 9.3 8.9 8.9  PROT 8.4*  --   --   BILITOT 0.9  --   --   ALKPHOS 117  --   --   ALT 13*  --   --   AST 49*  --   --   GLUCOSE 94 65 102*    Imaging/Diagnostic Tests: FINDINGS: Central venous catheter tip projects over the superior vena cava. Stable marked cardiomegaly. Pulmonary vascular redistribution and bilateral  perihilar interstitial pulmonary opacities. Mild pleural thickening. No definite pleural effusion. Regional skeleton is unremarkable.  IMPRESSION: Cardiomegaly and mild interstitial pulmonary edema.   From Prior ED Visit: CT abdomen and pelvis 6/12 IMPRESSION: 1. Trace ascites noted tracking about the liver and stomach. 2. 3.7 cm heterogeneous left renal mass at the atrophic native left kidney has decreased mildly in size since the prior study, but remains concerning for renal cell carcinoma. 3. Severe bilateral native renal atrophy. Left iliac transplant kidney is grossly unremarkable in appearance, though not well assessed. 4. Diffuse calcification along the abdominal aorta and its branches. KSmiley Houseman MD 01/09/2016, 6:50 AM PGY-1, [facility] pager: [phone] text pages welcome

## 2016-01-10 DIAGNOSIS — N186 End stage renal disease: Secondary | ICD-10-CM | POA: Diagnosis not present

## 2016-01-10 DIAGNOSIS — I442 Atrioventricular block, complete: Secondary | ICD-10-CM | POA: Diagnosis not present

## 2016-01-10 DIAGNOSIS — T8611 Kidney transplant rejection: Secondary | ICD-10-CM | POA: Diagnosis not present

## 2016-01-10 DIAGNOSIS — E1122 Type 2 diabetes mellitus with diabetic chronic kidney disease: Secondary | ICD-10-CM | POA: Diagnosis not present

## 2016-01-10 DIAGNOSIS — I132 Hypertensive heart and chronic kidney disease with heart failure and with stage 5 chronic kidney disease, or end stage renal disease: Secondary | ICD-10-CM | POA: Diagnosis not present

## 2016-01-10 DIAGNOSIS — E875 Hyperkalemia: Secondary | ICD-10-CM | POA: Diagnosis not present

## 2016-01-10 LAB — PROTIME-INR
INR: 1.3 (ref 0.00–1.49)
Prothrombin Time: 16.3 seconds — ABNORMAL HIGH (ref 11.6–15.2)

## 2016-01-10 LAB — TYPE AND SCREEN
ABO/RH(D): B POS
Antibody Screen: NEGATIVE
UNIT DIVISION: 0

## 2016-01-10 NOTE — Progress Notes (Signed)
NURSING PROGRESS NOTE  Frank [name] [id number] Discharge Data: 01/10/2016 8:10 AM Attending Provider: [name], MD PCP:[name] Patient     Frank [name] to be D/C'd Home per MD order.  Discussed with the patient the After Visit Summary and all questions fully answered. All IV's discontinued with no bleeding noted. All belongings returned to patient for patient to take home.   Last Vital Signs:  Blood pressure 163/106, pulse 83, temperature 98.2 F (36.8 C), temperature source Oral, resp. rate 18, weight 72.848 kg (160 lb 9.6 oz), SpO2 98 %.  Discharge Medication List   Medication List    STOP taking these medications        cloNIDine 0.3 mg/24hr patch  Commonly known as:  CATAPRES - Dosed in mg/24 hr     doxazosin 2 MG tablet  Commonly known as:  CARDURA     doxercalciferol 4 MCG/2ML injection  Commonly known as:  HECTOROL     hydrocerin Crea     HYDROcodone-acetaminophen 5-325 MG tablet  Commonly known as:  NORCO     ibuprofen 600 MG tablet  Commonly known as:  ADVIL,MOTRIN     lisinopril 20 MG tablet  Commonly known as:  PRINIVIL,ZESTRIL     lisinopril 40 MG tablet  Commonly known as:  PRINIVIL,ZESTRIL     metoCLOPramide 10 MG tablet  Commonly known as:  REGLAN     ondansetron 4 MG tablet  Commonly known as:  ZOFRAN     ondansetron 8 MG disintegrating tablet  Commonly known as:  ZOFRAN ODT     traMADol 50 MG tablet  Commonly known as:  ULTRAM      TAKE these medications        allopurinol 100 MG tablet  Commonly known as:  ZYLOPRIM  Take 100 mg by mouth daily.     ALPRAZolam 0.5 MG tablet  Commonly known as:  XANAX  Take 1 tablet (0.5 mg total) by mouth 2 (two) times daily as needed for anxiety.     amLODipine 10 MG tablet  Commonly known as:  NORVASC  Take 10 mg by mouth daily.     atorvastatin 40 MG tablet  Commonly known as:  LIPITOR  Take 1 tablet (40 mg total) by mouth daily at 6 PM.     carvedilol 12.5 MG tablet  Commonly  known as:  COREG  Take 1 tablet (12.5 mg total) by mouth 2 (two) times daily with a meal.     cinacalcet 30 MG tablet  Commonly known as:  SENSIPAR  Take 30 mg by mouth daily.     cyclobenzaprine 10 MG tablet  Commonly known as:  FLEXERIL  Take 1 tablet (10 mg total) by mouth 3 (three) times daily as needed for muscle spasms.     docusate sodium 100 MG capsule  Commonly known as:  COLACE  Take 1 capsule (100 mg total) by mouth 2 (two) times daily.     gabapentin 100 MG capsule  Commonly known as:  NEURONTIN  Take 100 mg by mouth 3 (three) times daily.     hydrALAZINE 100 MG tablet  Commonly known as:  APRESOLINE  Take 100 mg by mouth every 8 (eight) hours.     hydrOXYzine 10 MG tablet  Commonly known as:  ATARAX/VISTARIL  Take 1 tablet (10 mg total) by mouth 3 (three) times daily as needed for itching.     hyoscyamine 0.125 MG SL tablet  Commonly known as:  LEVSIN/SL  Place 1 tablet (0.125 mg total) under the tongue every 4 (four) hours as needed.     isosorbide mononitrate 60 MG 24 hr tablet  Commonly known as:  IMDUR  Take 1 tablet (60 mg total) by mouth daily.     levalbuterol 45 MCG/ACT inhaler  Commonly known as:  XOPENEX HFA  Inhale 2 puffs into the lungs every 6 (six) hours as needed for wheezing or shortness of breath.     multivitamin Tabs tablet  Take 1 tablet by mouth at bedtime.     omeprazole 20 MG capsule  Commonly known as:  PRILOSEC  Take 20 mg by mouth daily.     oxyCODONE-acetaminophen 5-325 MG tablet  Commonly known as:  PERCOCET/ROXICET  Take 1-2 tablets by mouth every 6 (six) hours as needed for severe pain.     predniSONE 10 MG tablet  Commonly known as:  DELTASONE  Take 10 mg by mouth daily with breakfast.     sevelamer carbonate 800 MG tablet  Commonly known as:  RENVELA  Take 800-1,600 mg by mouth 3 (three) times daily with meals. 2 tabs three times daily with meals, and 1 tablet with snacks     warfarin 6 MG tablet  Commonly known  as:  COUMADIN  Take 6 mg by mouth daily. Take along with a 38m tablet to make total dose of 722mPer pharmacy records     warfarin 1 MG tablet  Commonly known as:  COUMADIN  Take 1 mg by mouth daily. Take along with the 88m59mablet to make total dose of 7mg55m      Frank [name]

## 2016-01-10 NOTE — Progress Notes (Signed)
Patient had discharge orders placed on 01/09/16, before he came back to the floor from hemodialysis. Upon pt arrival to the floor at 2150, he refused to leave due to weakness and transportation issues. MD and house supervisor decided that patient needs to see attending physician before he leaves on 01/10/16. Pt wants to be seen before 0800, so he can go to previously scheduled surgery at [facility]. Pt advised that MD might not be able to make early rounding and if he leaves before seeing a doctor it will be documented as leave AMA. Pt had a breakfast, advised to contact surgery center and inform them about not being NPO as requested by surgery team (per patient).

## 2016-01-10 NOTE — Discharge Instructions (Signed)
You were admitted to the hospital for shortness of breath. It was thought to be due to fluid overload. Your symptoms improved with dialysis. You also reported of chest pain. Your heart markers were trended and the cardiology also evaluated you. Cardiology recommended no further invasive workup because you had a left heart catheterization to look at your heart vessels in 2016.  You were also noted to have elevated blood pressures. Your blood pressures did improve with medication and dialysis. Please take your blood pressure medications as prescribed.   These are the blood pressure medications you need to take. You reported that you have all of these medications at home: Amlodipine 70m daily, Carvedilol 12.550mtwice a day, Imdur 6023maily (this dose is lower than your regular home dose), Hydralazine 100m69mery 8 hours.  Please stop taking Lisinopril for now.   You can take your usual coumadin dose this evening (7mg 61ms evening); your dialysis center will let you know how to adjust this medication if needed.

## 2016-01-10 NOTE — Progress Notes (Signed)
Pt refused telemetry monitoring, CCMD and MD notified.

## 2016-01-11 DIAGNOSIS — I132 Hypertensive heart and chronic kidney disease with heart failure and with stage 5 chronic kidney disease, or end stage renal disease: Secondary | ICD-10-CM | POA: Diagnosis not present

## 2016-01-11 DIAGNOSIS — R918 Other nonspecific abnormal finding of lung field: Secondary | ICD-10-CM | POA: Diagnosis not present

## 2016-01-11 DIAGNOSIS — Z79899 Other long term (current) drug therapy: Secondary | ICD-10-CM | POA: Diagnosis not present

## 2016-01-11 DIAGNOSIS — G8929 Other chronic pain: Secondary | ICD-10-CM | POA: Diagnosis not present

## 2016-01-11 DIAGNOSIS — E1122 Type 2 diabetes mellitus with diabetic chronic kidney disease: Secondary | ICD-10-CM | POA: Diagnosis not present

## 2016-01-11 DIAGNOSIS — R1011 Right upper quadrant pain: Secondary | ICD-10-CM | POA: Diagnosis not present

## 2016-01-11 DIAGNOSIS — R0602 Shortness of breath: Secondary | ICD-10-CM | POA: Diagnosis not present

## 2016-01-11 DIAGNOSIS — E875 Hyperkalemia: Secondary | ICD-10-CM | POA: Diagnosis not present

## 2016-01-11 DIAGNOSIS — T8611 Kidney transplant rejection: Secondary | ICD-10-CM | POA: Diagnosis not present

## 2016-01-11 DIAGNOSIS — Z992 Dependence on renal dialysis: Secondary | ICD-10-CM | POA: Diagnosis not present

## 2016-01-11 DIAGNOSIS — N186 End stage renal disease: Secondary | ICD-10-CM | POA: Diagnosis not present

## 2016-01-11 DIAGNOSIS — R1031 Right lower quadrant pain: Secondary | ICD-10-CM | POA: Diagnosis not present

## 2016-01-11 DIAGNOSIS — I509 Heart failure, unspecified: Secondary | ICD-10-CM | POA: Diagnosis not present

## 2016-01-11 DIAGNOSIS — D649 Anemia, unspecified: Secondary | ICD-10-CM | POA: Diagnosis not present

## 2016-01-11 DIAGNOSIS — I442 Atrioventricular block, complete: Secondary | ICD-10-CM | POA: Diagnosis not present

## 2016-01-11 DIAGNOSIS — R112 Nausea with vomiting, unspecified: Secondary | ICD-10-CM | POA: Diagnosis not present

## 2016-01-14 DIAGNOSIS — K219 Gastro-esophageal reflux disease without esophagitis: Secondary | ICD-10-CM | POA: Diagnosis present

## 2016-01-14 DIAGNOSIS — I5020 Unspecified systolic (congestive) heart failure: Secondary | ICD-10-CM | POA: Diagnosis not present

## 2016-01-14 DIAGNOSIS — G8929 Other chronic pain: Secondary | ICD-10-CM | POA: Diagnosis not present

## 2016-01-14 DIAGNOSIS — I499 Cardiac arrhythmia, unspecified: Secondary | ICD-10-CM | POA: Diagnosis not present

## 2016-01-14 DIAGNOSIS — I132 Hypertensive heart and chronic kidney disease with heart failure and with stage 5 chronic kidney disease, or end stage renal disease: Secondary | ICD-10-CM | POA: Diagnosis present

## 2016-01-14 DIAGNOSIS — E871 Hypo-osmolality and hyponatremia: Secondary | ICD-10-CM | POA: Diagnosis not present

## 2016-01-14 DIAGNOSIS — I442 Atrioventricular block, complete: Secondary | ICD-10-CM | POA: Diagnosis present

## 2016-01-14 DIAGNOSIS — E119 Type 2 diabetes mellitus without complications: Secondary | ICD-10-CM | POA: Diagnosis not present

## 2016-01-14 DIAGNOSIS — N2889 Other specified disorders of kidney and ureter: Secondary | ICD-10-CM | POA: Diagnosis present

## 2016-01-14 DIAGNOSIS — D649 Anemia, unspecified: Secondary | ICD-10-CM | POA: Diagnosis present

## 2016-01-14 DIAGNOSIS — R0602 Shortness of breath: Secondary | ICD-10-CM | POA: Diagnosis not present

## 2016-01-14 DIAGNOSIS — I120 Hypertensive chronic kidney disease with stage 5 chronic kidney disease or end stage renal disease: Secondary | ICD-10-CM | POA: Diagnosis not present

## 2016-01-14 DIAGNOSIS — R001 Bradycardia, unspecified: Secondary | ICD-10-CM | POA: Diagnosis not present

## 2016-01-14 DIAGNOSIS — K5900 Constipation, unspecified: Secondary | ICD-10-CM | POA: Diagnosis present

## 2016-01-14 DIAGNOSIS — I441 Atrioventricular block, second degree: Secondary | ICD-10-CM | POA: Diagnosis not present

## 2016-01-14 DIAGNOSIS — M109 Gout, unspecified: Secondary | ICD-10-CM | POA: Diagnosis present

## 2016-01-14 DIAGNOSIS — Z856 Personal history of leukemia: Secondary | ICD-10-CM | POA: Diagnosis not present

## 2016-01-14 DIAGNOSIS — E875 Hyperkalemia: Secondary | ICD-10-CM | POA: Diagnosis not present

## 2016-01-14 DIAGNOSIS — G4733 Obstructive sleep apnea (adult) (pediatric): Secondary | ICD-10-CM | POA: Diagnosis not present

## 2016-01-14 DIAGNOSIS — E861 Hypovolemia: Secondary | ICD-10-CM | POA: Diagnosis not present

## 2016-01-14 DIAGNOSIS — Z992 Dependence on renal dialysis: Secondary | ICD-10-CM | POA: Diagnosis not present

## 2016-01-14 DIAGNOSIS — R1031 Right lower quadrant pain: Secondary | ICD-10-CM | POA: Diagnosis not present

## 2016-01-14 DIAGNOSIS — R112 Nausea with vomiting, unspecified: Secondary | ICD-10-CM | POA: Diagnosis not present

## 2016-01-14 DIAGNOSIS — I429 Cardiomyopathy, unspecified: Secondary | ICD-10-CM | POA: Diagnosis present

## 2016-01-14 DIAGNOSIS — E1122 Type 2 diabetes mellitus with diabetic chronic kidney disease: Secondary | ICD-10-CM | POA: Diagnosis present

## 2016-01-14 DIAGNOSIS — E785 Hyperlipidemia, unspecified: Secondary | ICD-10-CM | POA: Diagnosis present

## 2016-01-14 DIAGNOSIS — T8612 Kidney transplant failure: Secondary | ICD-10-CM | POA: Diagnosis not present

## 2016-01-14 DIAGNOSIS — R9431 Abnormal electrocardiogram [ECG] [EKG]: Secondary | ICD-10-CM | POA: Diagnosis not present

## 2016-01-14 DIAGNOSIS — Z86718 Personal history of other venous thrombosis and embolism: Secondary | ICD-10-CM | POA: Diagnosis not present

## 2016-01-14 DIAGNOSIS — Z87891 Personal history of nicotine dependence: Secondary | ICD-10-CM | POA: Diagnosis not present

## 2016-01-14 DIAGNOSIS — N186 End stage renal disease: Secondary | ICD-10-CM | POA: Diagnosis not present

## 2016-01-14 DIAGNOSIS — I160 Hypertensive urgency: Secondary | ICD-10-CM | POA: Diagnosis not present

## 2016-01-14 DIAGNOSIS — R1011 Right upper quadrant pain: Secondary | ICD-10-CM | POA: Diagnosis not present

## 2016-01-14 DIAGNOSIS — I444 Left anterior fascicular block: Secondary | ICD-10-CM | POA: Diagnosis not present

## 2016-01-14 DIAGNOSIS — R918 Other nonspecific abnormal finding of lung field: Secondary | ICD-10-CM | POA: Diagnosis not present

## 2016-01-14 DIAGNOSIS — T8611 Kidney transplant rejection: Secondary | ICD-10-CM | POA: Diagnosis present

## 2016-01-18 DIAGNOSIS — D631 Anemia in chronic kidney disease: Secondary | ICD-10-CM | POA: Diagnosis not present

## 2016-01-18 DIAGNOSIS — N2581 Secondary hyperparathyroidism of renal origin: Secondary | ICD-10-CM | POA: Diagnosis not present

## 2016-01-18 DIAGNOSIS — Z7901 Long term (current) use of anticoagulants: Secondary | ICD-10-CM | POA: Diagnosis not present

## 2016-01-18 DIAGNOSIS — Z418 Encounter for other procedures for purposes other than remedying health state: Secondary | ICD-10-CM | POA: Diagnosis not present

## 2016-01-18 DIAGNOSIS — N186 End stage renal disease: Secondary | ICD-10-CM | POA: Diagnosis not present

## 2016-01-19 DIAGNOSIS — N186 End stage renal disease: Secondary | ICD-10-CM | POA: Diagnosis not present

## 2016-01-19 DIAGNOSIS — Z992 Dependence on renal dialysis: Secondary | ICD-10-CM | POA: Diagnosis not present

## 2016-01-20 DIAGNOSIS — E8779 Other fluid overload: Secondary | ICD-10-CM | POA: Diagnosis not present

## 2016-01-20 DIAGNOSIS — I10 Essential (primary) hypertension: Secondary | ICD-10-CM | POA: Diagnosis not present

## 2016-01-20 DIAGNOSIS — N2581 Secondary hyperparathyroidism of renal origin: Secondary | ICD-10-CM | POA: Diagnosis not present

## 2016-01-20 DIAGNOSIS — Z418 Encounter for other procedures for purposes other than remedying health state: Secondary | ICD-10-CM | POA: Diagnosis not present

## 2016-01-20 DIAGNOSIS — N186 End stage renal disease: Secondary | ICD-10-CM | POA: Diagnosis not present

## 2016-01-20 DIAGNOSIS — D631 Anemia in chronic kidney disease: Secondary | ICD-10-CM | POA: Diagnosis not present

## 2016-01-20 IMAGING — US US SCROTUM
1 series · 13 of 25 positions shown · non-contrast
Comparison: Scrotal ultrasound performed 05/09/2015

CLINICAL DATA: Acute onset of right testicular pain. Initial
encounter.

EXAM:
SCROTAL ULTRASOUND
DOPPLER ULTRASOUND OF THE TESTICLES
TECHNIQUE: Complete ultrasound examination of the testicles, epididymis, and
other scrotal structures was performed. Color and spectral Doppler
ultrasound were also utilized to evaluate blood flow to the
testicles.

[Series 1: us scrotum · 0.06mm/px · 13 of 38 slices shown]
[im 1/38]
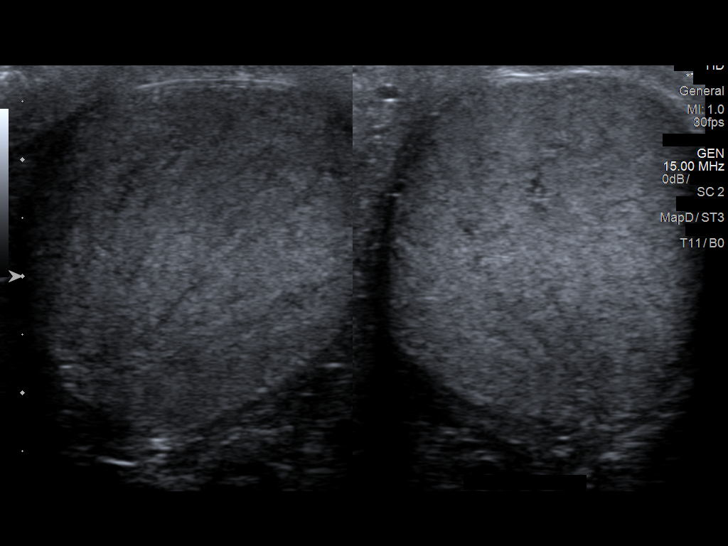
[im 4/38]
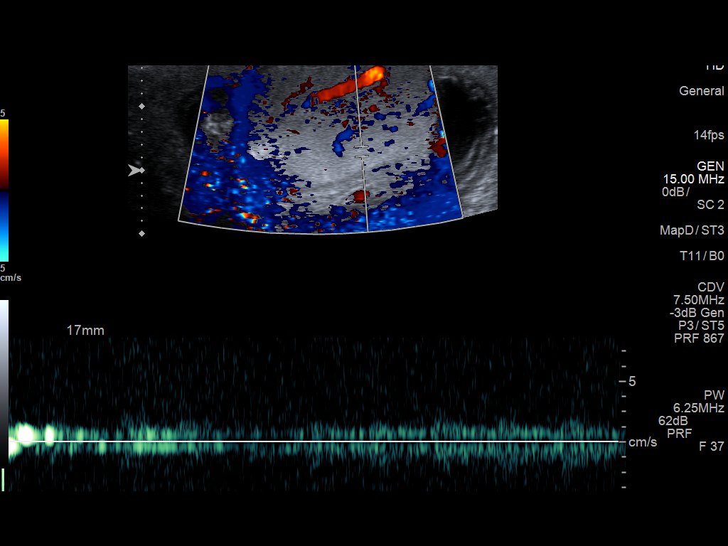
[im 7/38]
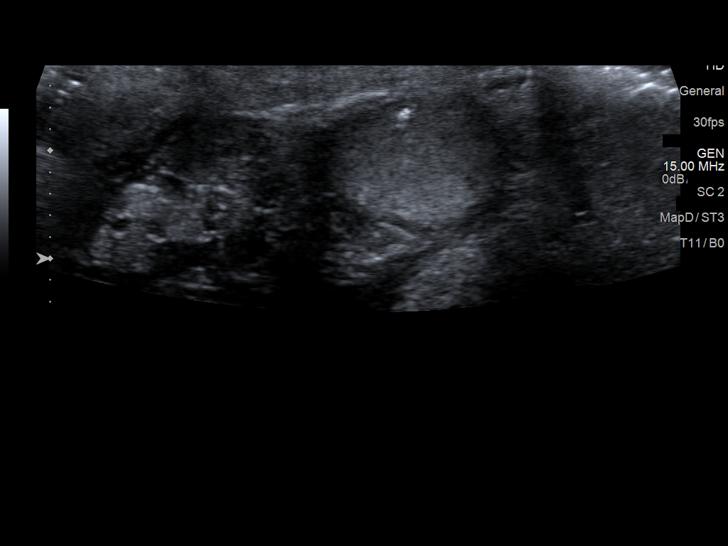
[im 10/38]
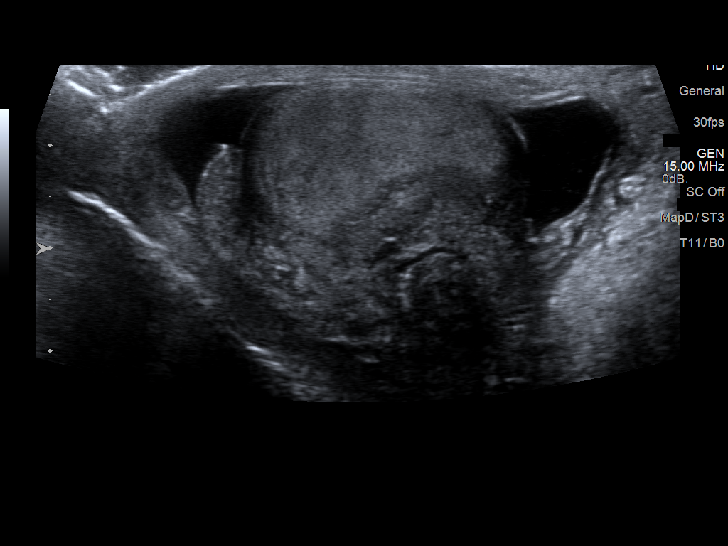
[im 13/38]
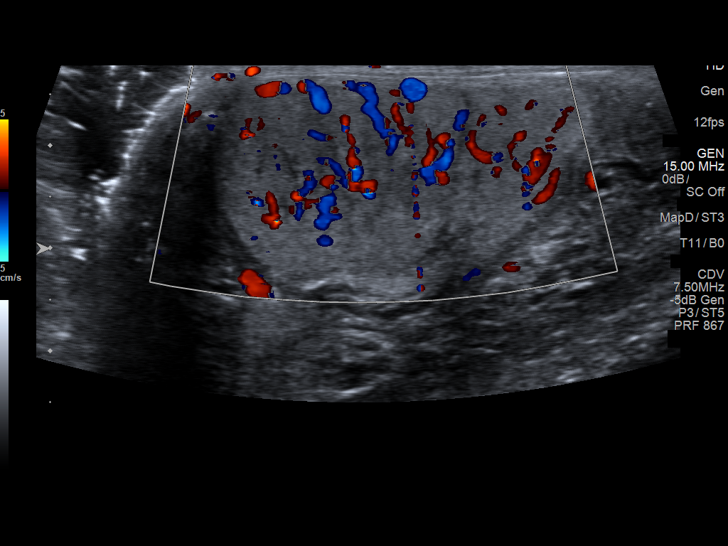
[im 16/38]
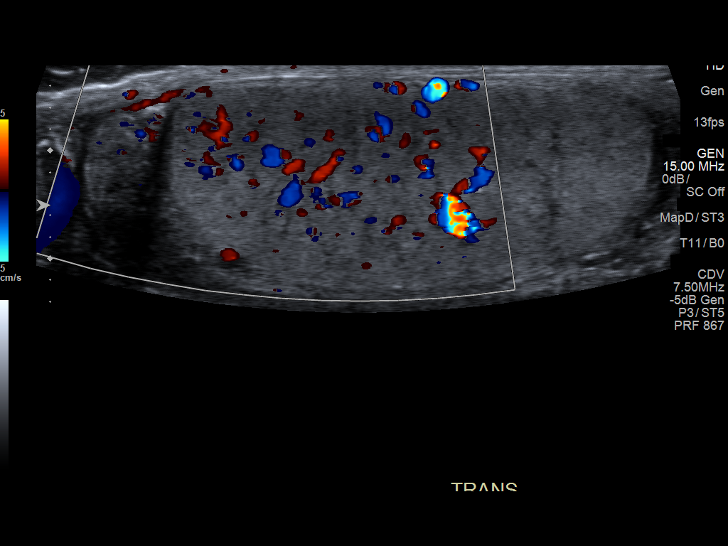
[im 19/38]
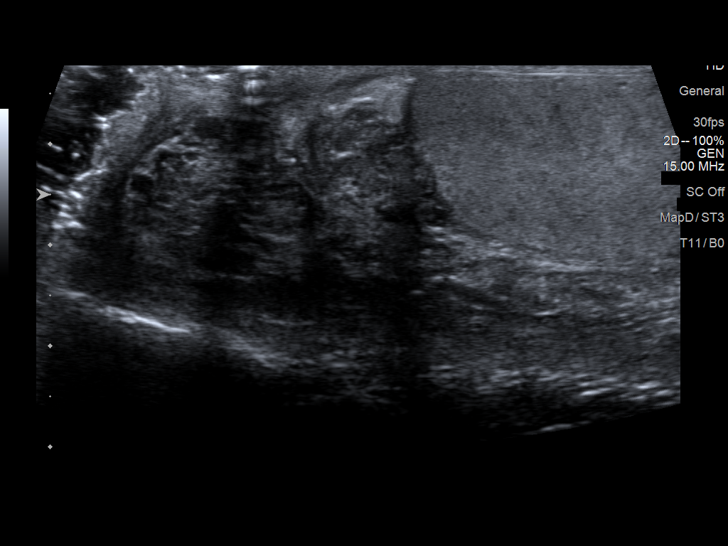
[im 22/38]
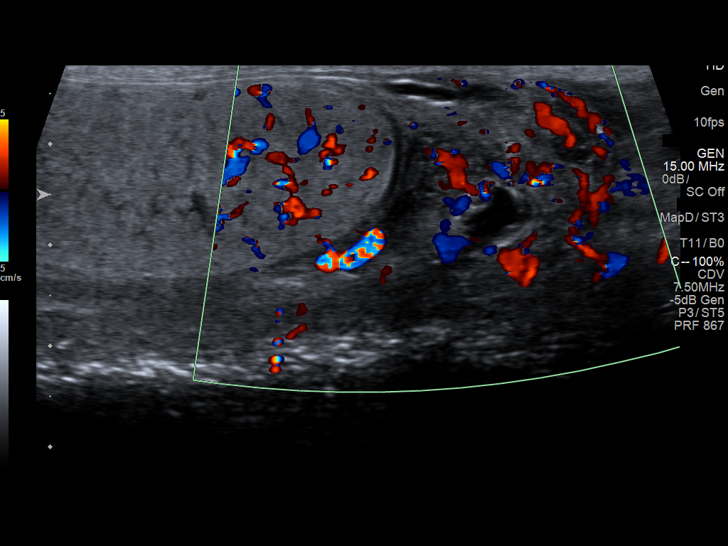
[im 25/38]
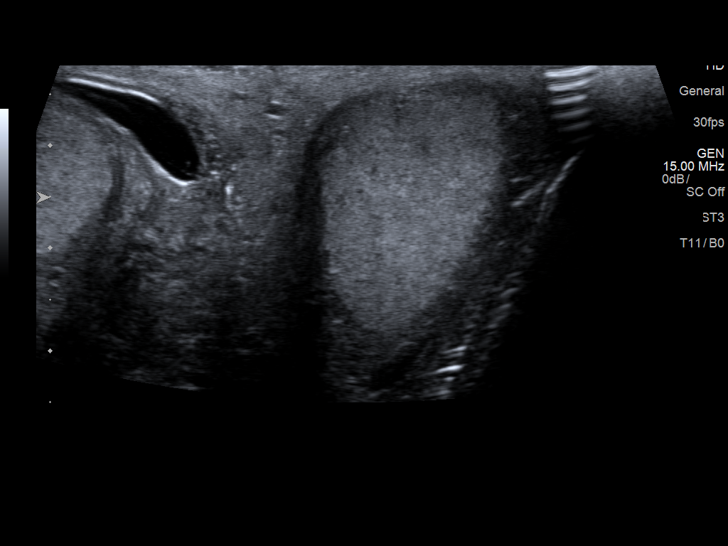
[im 28/38]
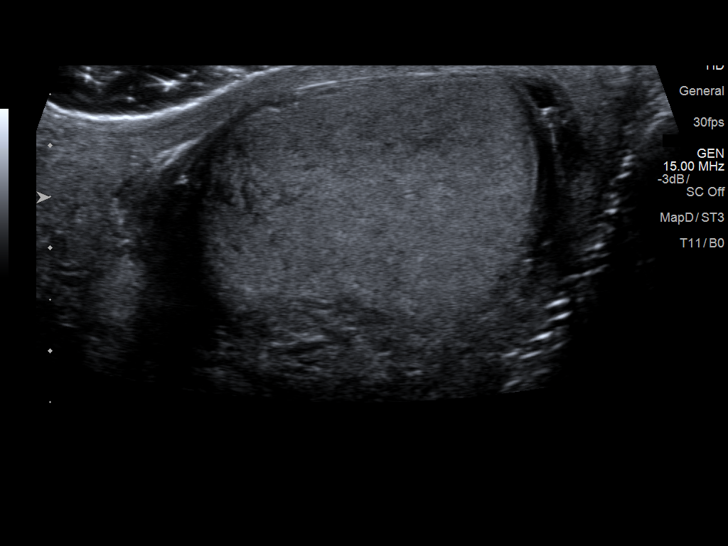
[im 31/38]
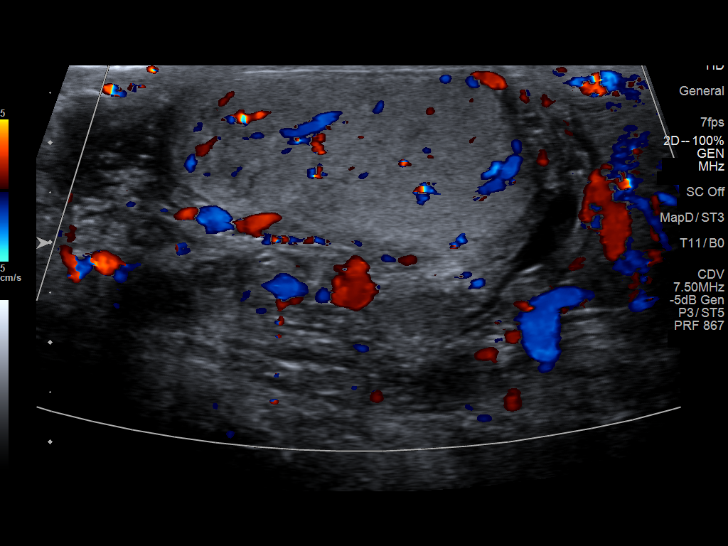
[im 34/38]
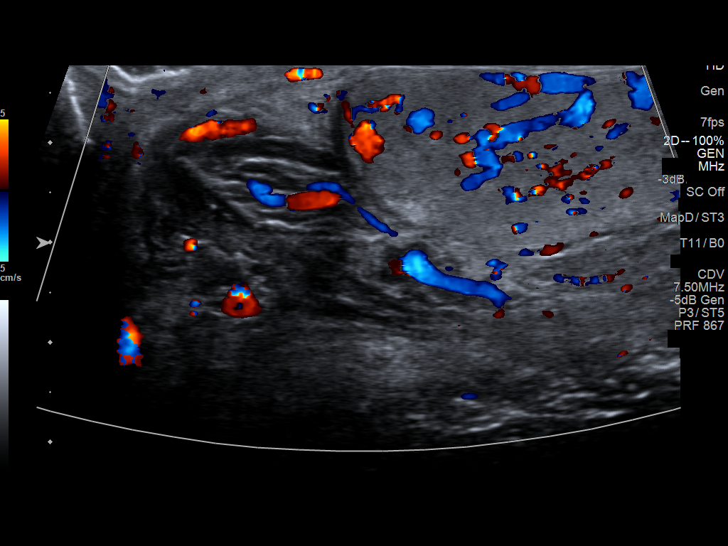
[im 38/38]
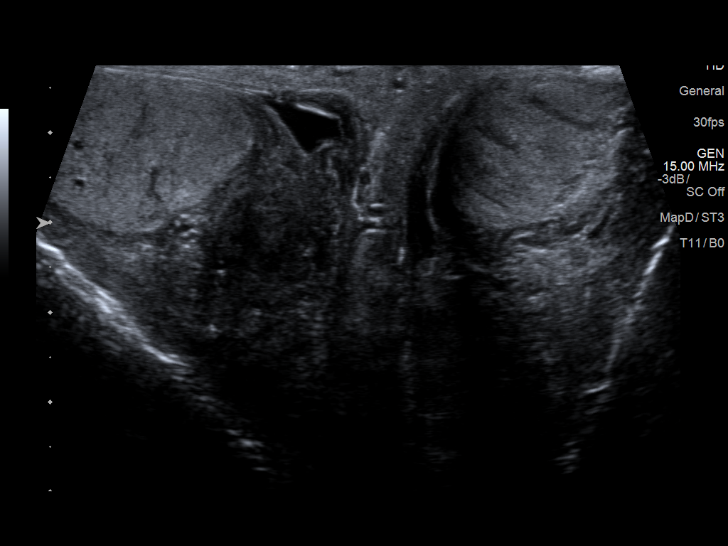

[13 of 25 positions shown; findings below may reference images not displayed]

FINDINGS: Right testicle

Measurements: 3.5 x 2.2 x 3.0 cm. No mass visualized. A single 2 mm
calcification is noted at the superior aspect of the testis.

Left testicle

Measurements: 3.1 x 2.2 x 2.8 cm. No mass or microlithiasis
visualized.

Right epididymis: Normal in size and appearance, though only
partially characterized.

Left epididymis:  Not visualized.

Hydrocele:  None visualized.

Varicocele:  None visualized.

Pulsed Doppler interrogation of both testes demonstrates normal low
resistance arterial and venous waveforms bilaterally.

Chronic inflammatory change is noted at the scrotal wall, more
prominent on the left.
IMPRESSION: 1. No evidence of testicular torsion.
2. Small calcification noted within the right testis. Testes
otherwise unremarkable.
3. Chronic inflammatory change at the scrotal wall, more prominent
on the left.

## 2016-01-23 DIAGNOSIS — Z7901 Long term (current) use of anticoagulants: Secondary | ICD-10-CM | POA: Diagnosis not present

## 2016-01-23 DIAGNOSIS — N2581 Secondary hyperparathyroidism of renal origin: Secondary | ICD-10-CM | POA: Diagnosis not present

## 2016-01-23 DIAGNOSIS — E8779 Other fluid overload: Secondary | ICD-10-CM | POA: Diagnosis not present

## 2016-01-23 DIAGNOSIS — N186 End stage renal disease: Secondary | ICD-10-CM | POA: Diagnosis not present

## 2016-01-23 DIAGNOSIS — D631 Anemia in chronic kidney disease: Secondary | ICD-10-CM | POA: Diagnosis not present

## 2016-01-23 DIAGNOSIS — Z418 Encounter for other procedures for purposes other than remedying health state: Secondary | ICD-10-CM | POA: Diagnosis not present

## 2016-01-23 DIAGNOSIS — I10 Essential (primary) hypertension: Secondary | ICD-10-CM | POA: Diagnosis not present

## 2016-01-26 DIAGNOSIS — D631 Anemia in chronic kidney disease: Secondary | ICD-10-CM | POA: Diagnosis not present

## 2016-01-26 DIAGNOSIS — N186 End stage renal disease: Secondary | ICD-10-CM | POA: Diagnosis not present

## 2016-01-26 DIAGNOSIS — R109 Unspecified abdominal pain: Secondary | ICD-10-CM | POA: Diagnosis not present

## 2016-01-26 DIAGNOSIS — E8779 Other fluid overload: Secondary | ICD-10-CM | POA: Diagnosis not present

## 2016-01-26 DIAGNOSIS — N2581 Secondary hyperparathyroidism of renal origin: Secondary | ICD-10-CM | POA: Diagnosis not present

## 2016-01-26 DIAGNOSIS — I10 Essential (primary) hypertension: Secondary | ICD-10-CM | POA: Diagnosis not present

## 2016-01-26 DIAGNOSIS — Z992 Dependence on renal dialysis: Secondary | ICD-10-CM | POA: Diagnosis not present

## 2016-01-26 DIAGNOSIS — N2889 Other specified disorders of kidney and ureter: Secondary | ICD-10-CM | POA: Diagnosis not present

## 2016-01-26 DIAGNOSIS — Z418 Encounter for other procedures for purposes other than remedying health state: Secondary | ICD-10-CM | POA: Diagnosis not present

## 2016-01-27 DIAGNOSIS — N186 End stage renal disease: Secondary | ICD-10-CM | POA: Diagnosis not present

## 2016-01-27 DIAGNOSIS — N2581 Secondary hyperparathyroidism of renal origin: Secondary | ICD-10-CM | POA: Diagnosis not present

## 2016-01-27 DIAGNOSIS — E1139 Type 2 diabetes mellitus with other diabetic ophthalmic complication: Secondary | ICD-10-CM | POA: Diagnosis not present

## 2016-01-27 DIAGNOSIS — I10 Essential (primary) hypertension: Secondary | ICD-10-CM | POA: Diagnosis not present

## 2016-01-27 DIAGNOSIS — Z418 Encounter for other procedures for purposes other than remedying health state: Secondary | ICD-10-CM | POA: Diagnosis not present

## 2016-01-27 DIAGNOSIS — D631 Anemia in chronic kidney disease: Secondary | ICD-10-CM | POA: Diagnosis not present

## 2016-01-27 DIAGNOSIS — E8779 Other fluid overload: Secondary | ICD-10-CM | POA: Diagnosis not present

## 2016-01-29 DIAGNOSIS — N2581 Secondary hyperparathyroidism of renal origin: Secondary | ICD-10-CM | POA: Diagnosis not present

## 2016-01-29 DIAGNOSIS — Z418 Encounter for other procedures for purposes other than remedying health state: Secondary | ICD-10-CM | POA: Diagnosis not present

## 2016-01-29 DIAGNOSIS — E8779 Other fluid overload: Secondary | ICD-10-CM | POA: Diagnosis not present

## 2016-01-29 DIAGNOSIS — N186 End stage renal disease: Secondary | ICD-10-CM | POA: Diagnosis not present

## 2016-01-29 DIAGNOSIS — D631 Anemia in chronic kidney disease: Secondary | ICD-10-CM | POA: Diagnosis not present

## 2016-01-29 DIAGNOSIS — I10 Essential (primary) hypertension: Secondary | ICD-10-CM | POA: Diagnosis not present

## 2016-01-30 ENCOUNTER — Emergency Department (HOSPITAL_COMMUNITY): Payer: Medicare Other

## 2016-01-30 ENCOUNTER — Emergency Department (HOSPITAL_COMMUNITY)
Admission: EM | Admit: 2016-01-30 | Discharge: 2016-01-30 | Disposition: A | Discharge status: Home / Self Care | Payer: Medicare Other | Attending: Emergency Medicine | Admitting: Emergency Medicine

## 2016-01-30 DIAGNOSIS — R079 Chest pain, unspecified: Secondary | ICD-10-CM

## 2016-01-30 DIAGNOSIS — N2581 Secondary hyperparathyroidism of renal origin: Secondary | ICD-10-CM | POA: Diagnosis not present

## 2016-01-30 DIAGNOSIS — Z87891 Personal history of nicotine dependence: Secondary | ICD-10-CM | POA: Insufficient documentation

## 2016-01-30 DIAGNOSIS — N186 End stage renal disease: Secondary | ICD-10-CM | POA: Diagnosis not present

## 2016-01-30 DIAGNOSIS — R072 Precordial pain: Secondary | ICD-10-CM | POA: Insufficient documentation

## 2016-01-30 DIAGNOSIS — E119 Type 2 diabetes mellitus without complications: Secondary | ICD-10-CM | POA: Diagnosis not present

## 2016-01-30 DIAGNOSIS — I132 Hypertensive heart and chronic kidney disease with heart failure and with stage 5 chronic kidney disease, or end stage renal disease: Secondary | ICD-10-CM | POA: Insufficient documentation

## 2016-01-30 DIAGNOSIS — Z418 Encounter for other procedures for purposes other than remedying health state: Secondary | ICD-10-CM | POA: Diagnosis not present

## 2016-01-30 DIAGNOSIS — I10 Essential (primary) hypertension: Secondary | ICD-10-CM | POA: Diagnosis not present

## 2016-01-30 DIAGNOSIS — D631 Anemia in chronic kidney disease: Secondary | ICD-10-CM | POA: Diagnosis not present

## 2016-01-30 DIAGNOSIS — R0602 Shortness of breath: Secondary | ICD-10-CM | POA: Diagnosis not present

## 2016-01-30 DIAGNOSIS — I5042 Chronic combined systolic (congestive) and diastolic (congestive) heart failure: Secondary | ICD-10-CM | POA: Diagnosis not present

## 2016-01-30 DIAGNOSIS — Z7901 Long term (current) use of anticoagulants: Secondary | ICD-10-CM | POA: Insufficient documentation

## 2016-01-30 DIAGNOSIS — E8779 Other fluid overload: Secondary | ICD-10-CM | POA: Diagnosis not present

## 2016-01-30 LAB — CBC
HEMATOCRIT: 24.6 % — AB (ref 39.0–52.0)
HEMOGLOBIN: 7.7 g/dL — AB (ref 13.0–17.0)
MCH: 27.4 pg (ref 26.0–34.0)
MCHC: 31.3 g/dL (ref 30.0–36.0)
MCV: 87.5 fL (ref 78.0–100.0)
Platelets: 169 10*3/uL (ref 150–400)
RBC: 2.81 MIL/uL — ABNORMAL LOW (ref 4.22–5.81)
RDW: 16.7 % — AB (ref 11.5–15.5)
WBC: 6.2 10*3/uL (ref 4.0–10.5)

## 2016-01-30 LAB — TROPONIN I: TROPONIN I: 0.03 ng/mL — AB (ref <0.03)

## 2016-01-30 LAB — BASIC METABOLIC PANEL
ANION GAP: 8 (ref 5–15)
BUN: 23 mg/dL — AB (ref 6–20)
CHLORIDE: 101 mmol/L (ref 101–111)
CO2: 29 mmol/L (ref 22–32)
Calcium: 8.3 mg/dL — ABNORMAL LOW (ref 8.9–10.3)
Creatinine, Ser: 6.12 mg/dL — ABNORMAL HIGH (ref 0.61–1.24)
GFR calc Af Amer: 11 mL/min — ABNORMAL LOW (ref >60)
GFR, EST NON AFRICAN AMERICAN: 10 mL/min — AB (ref >60)
GLUCOSE: 95 mg/dL (ref 65–99)
POTASSIUM: 4.1 mmol/L (ref 3.5–5.1)
Sodium: 138 mmol/L (ref 135–145)

## 2016-01-30 LAB — I-STAT TROPONIN, ED: Troponin i, poc: 0.03 ng/mL (ref 0.00–0.08)

## 2016-01-30 MED ORDER — HYDROCODONE-ACETAMINOPHEN 5-325 MG PO TABS
1.0000 | ORAL_TABLET | Freq: Once | ORAL | Status: AC
  Administered 2016-01-30: 1 via ORAL
  Filled 2016-01-30: qty 1

## 2016-01-30 MED ORDER — KETOROLAC TROMETHAMINE 30 MG/ML IJ SOLN
15.0000 mg | Freq: Once | INTRAMUSCULAR | Status: AC
  Administered 2016-01-30: 15 mg via INTRAVENOUS
  Filled 2016-01-30: qty 1
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
## 2016-01-30 NOTE — ED Notes (Signed)
Patient c/o of nausea and states he can't take oral pain medication until he has nausea medicine.

## 2016-01-30 NOTE — ED Notes (Signed)
Pt in from home via [facility], per report pt had HD today, per report that he had 10 lbs removed today at HD & usually has 4-5 lbs removed, pt has nausea, denies v/d & SOB, pt reports generalized CP today while ambulating post HD tx, pt rcvd 324 mg ASA pta & 4 mg zofran

## 2016-01-30 NOTE — ED Notes (Signed)
Pt given apple juice, water, saltines, and peanut butter per Christen Bame, RN

## 2016-01-31 IMAGING — CR DG CHEST 2V
3 series · 3 of 3 positions shown · non-contrast
Comparison: Chest radiograph performed 09/07/2013

CLINICAL DATA: Left-sided chest pain, shortness of breath and mild
nausea. History of smoking.

EXAM:
CHEST  2 VIEW

[w chest lat]
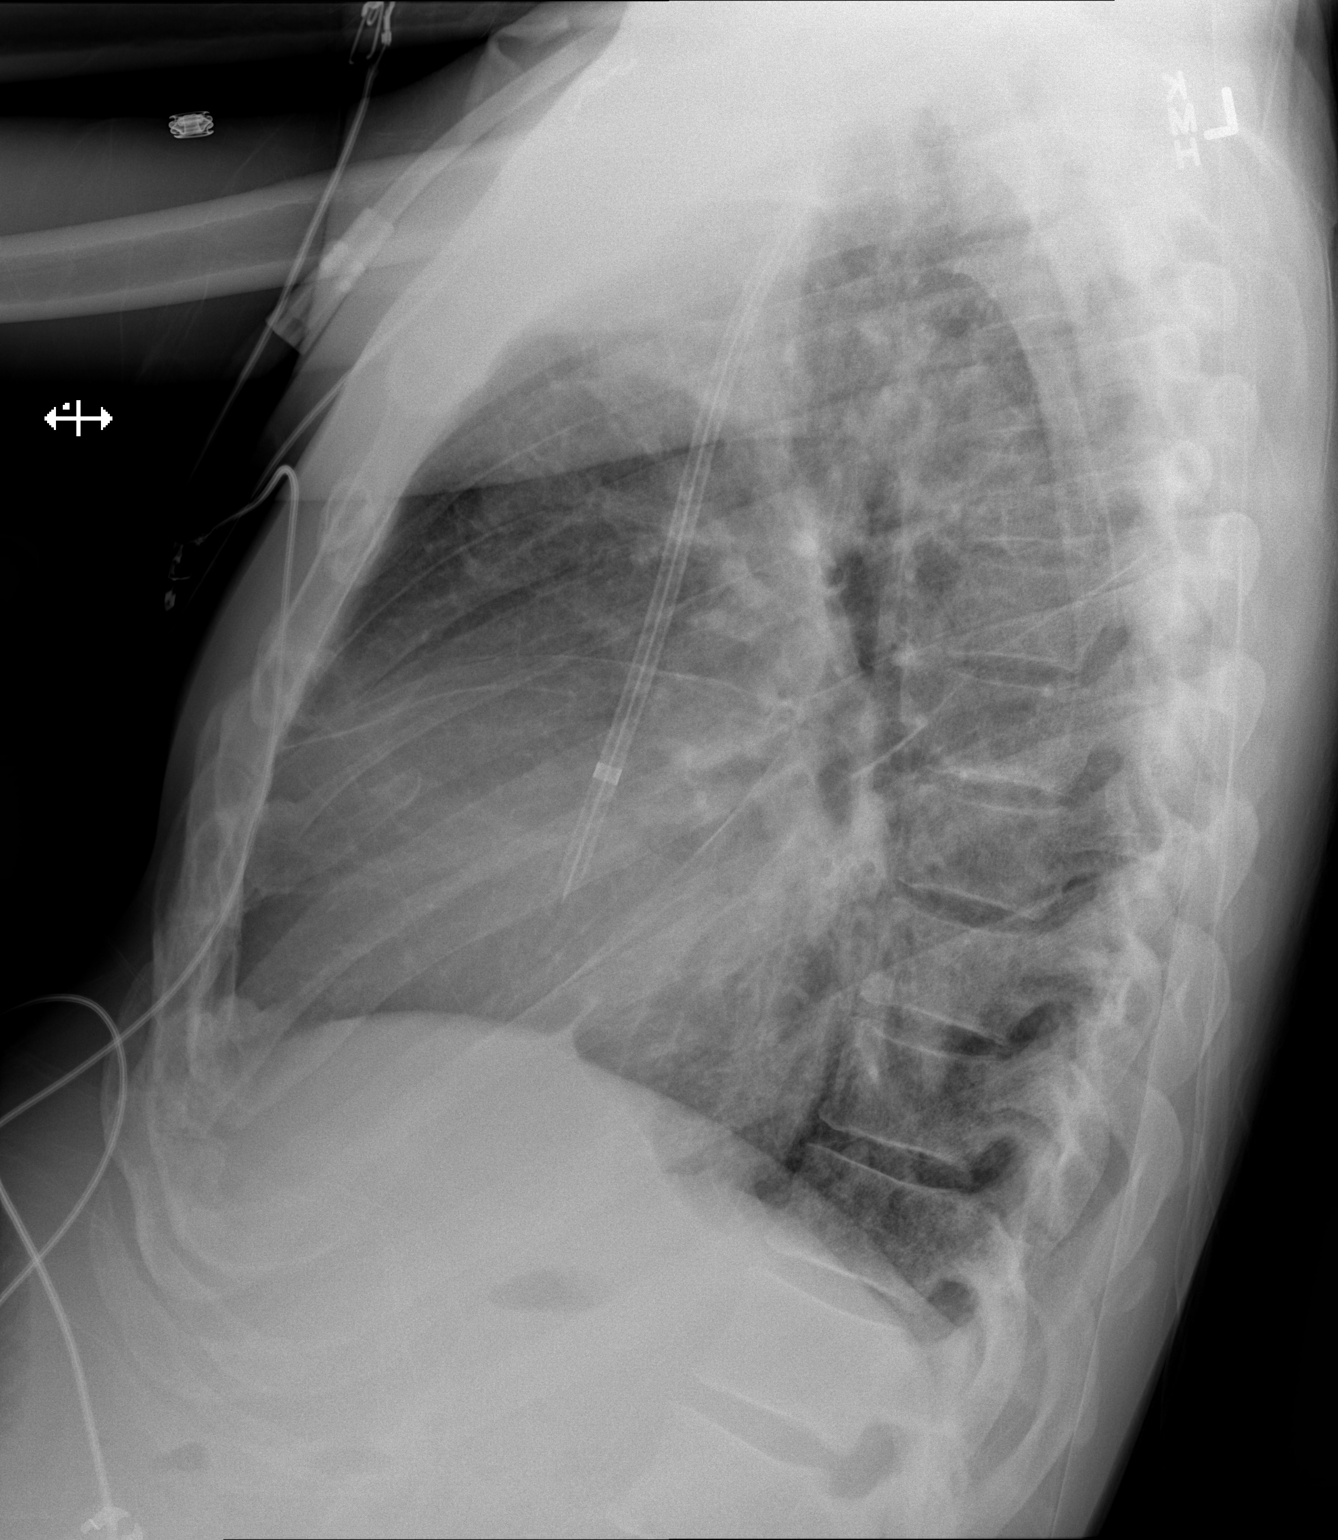

[x chest ap (1 of 2)]
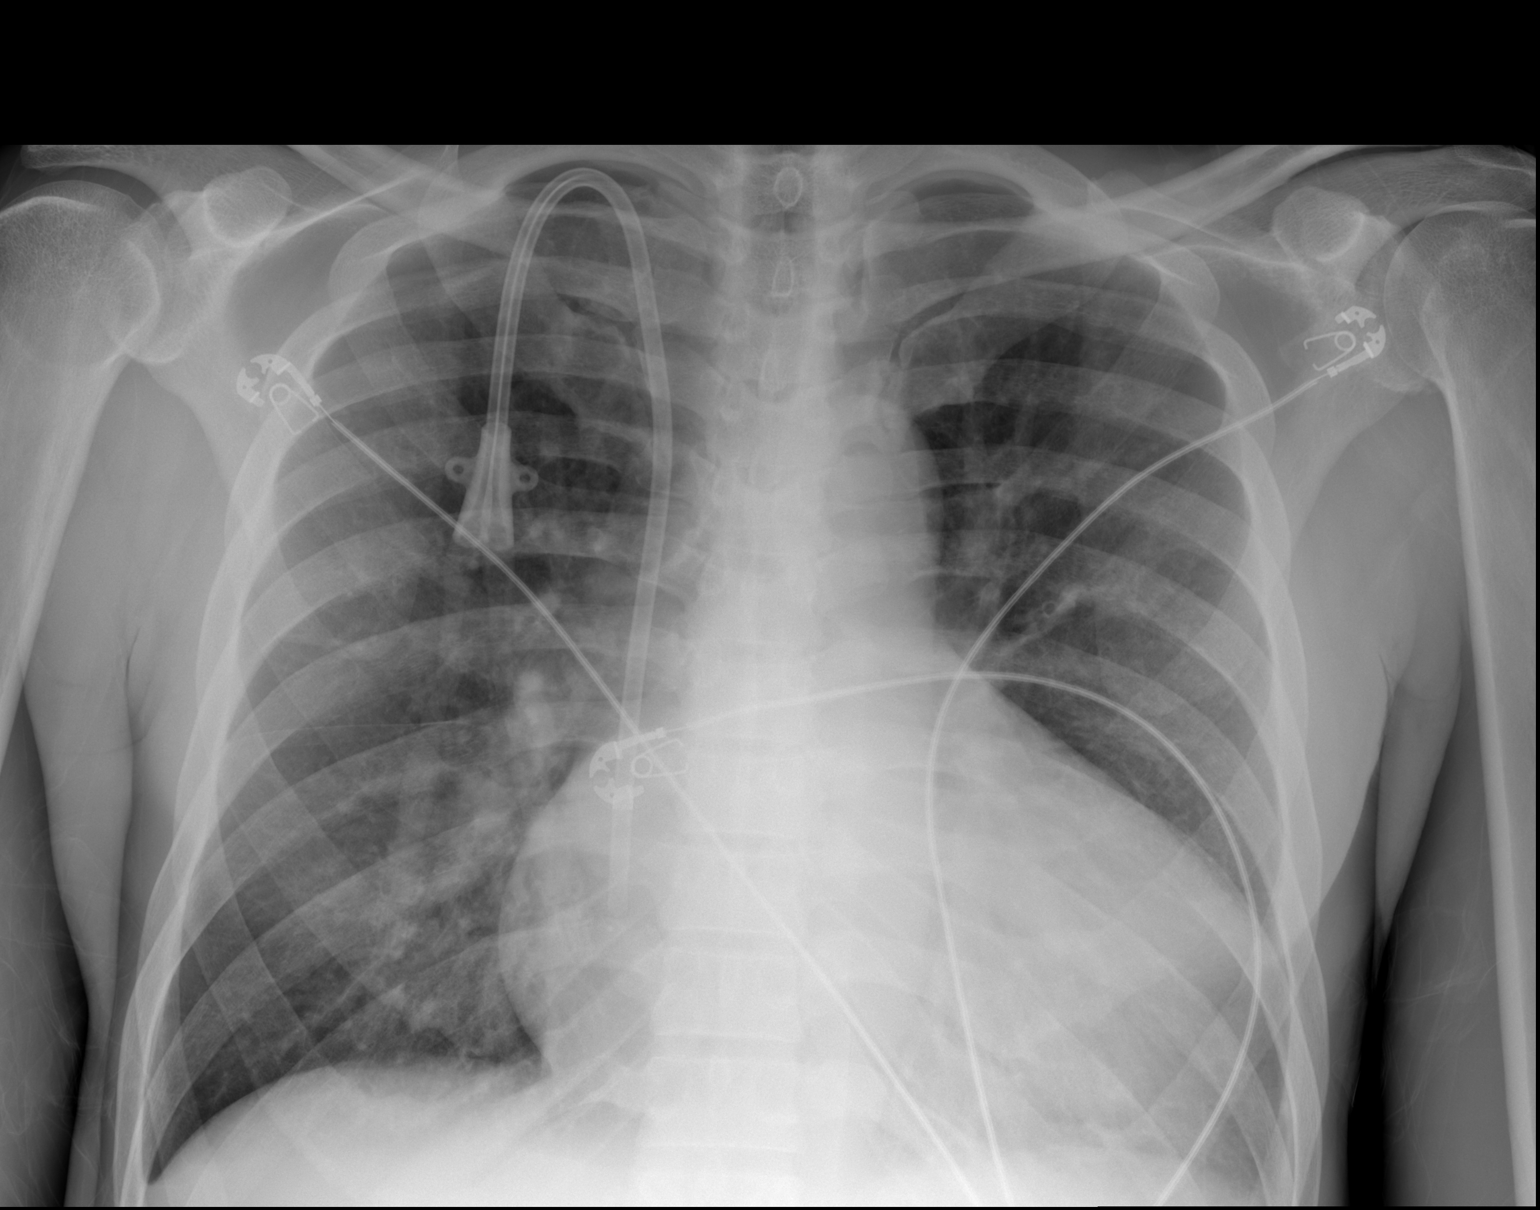

[x chest ap (2 of 2)]
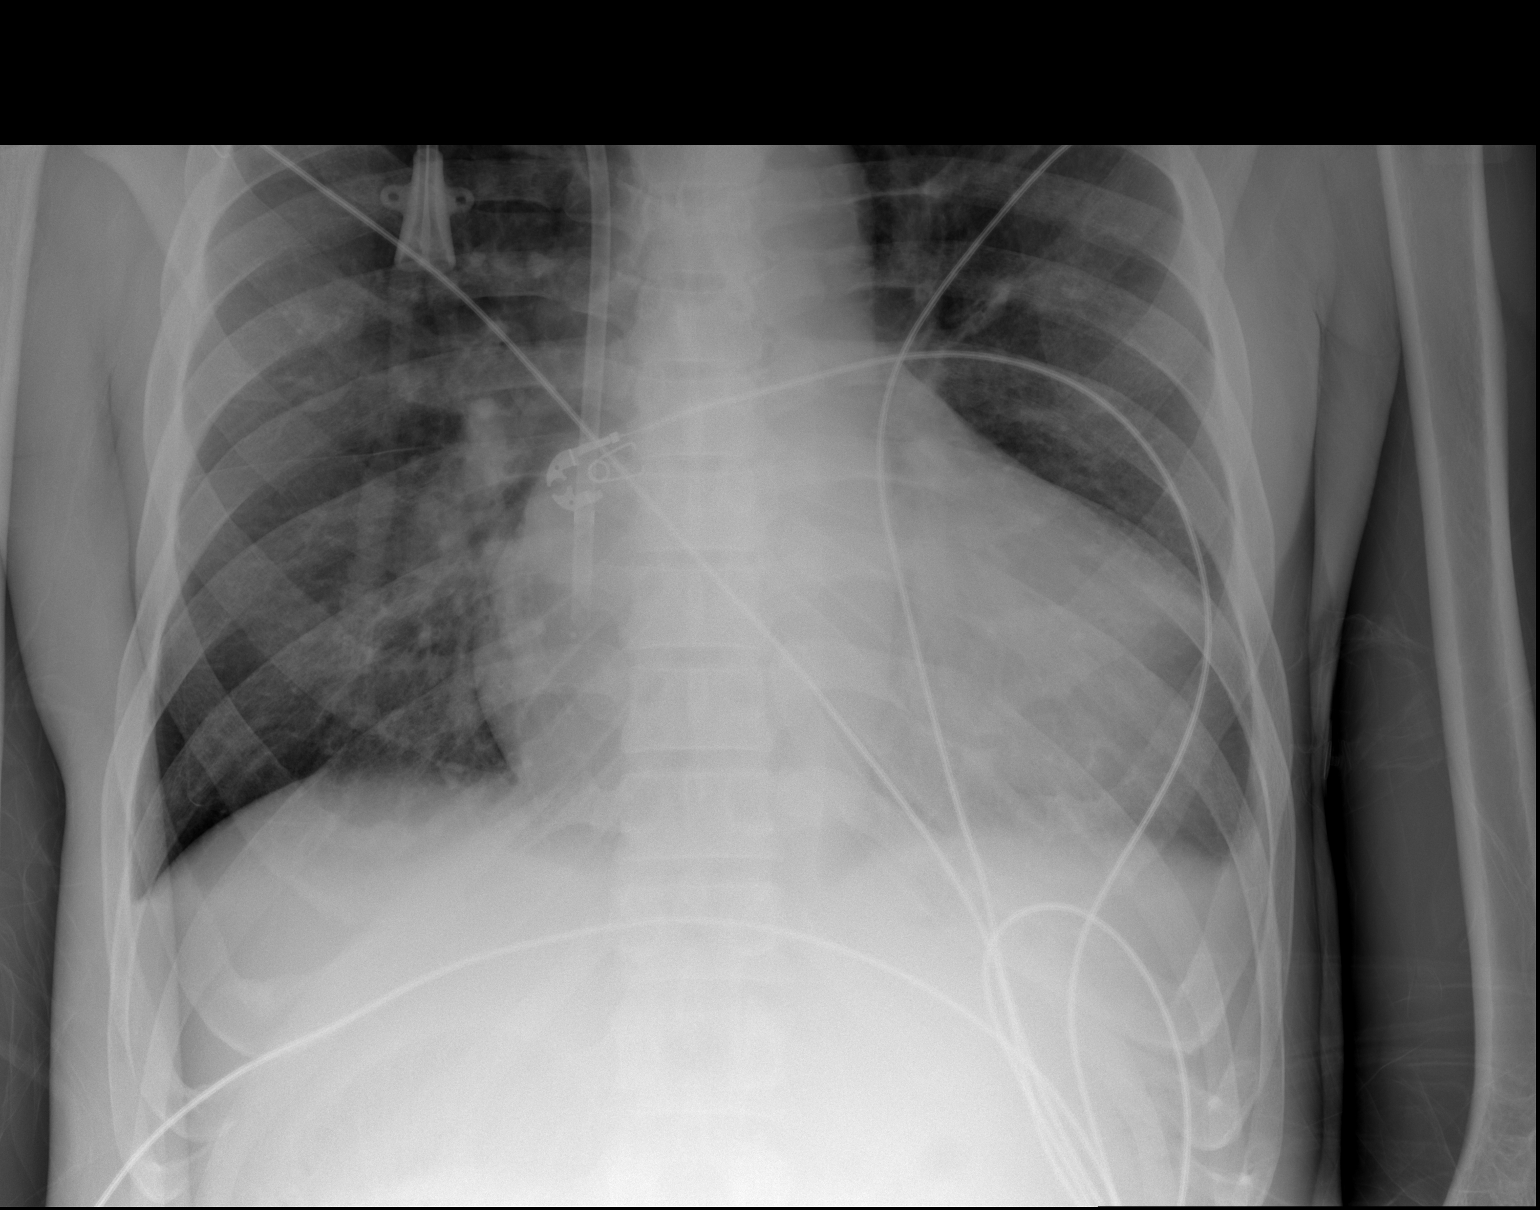

[3 of 3 positions shown; findings below may reference images not displayed]

FINDINGS: The lungs are well-aerated. A small left pleural effusion is again
seen. Mild vascular congestion is noted, without significant
pulmonary edema. There is no evidence of pneumothorax.

The heart is mildly enlarged. A right-sided dual-lumen catheter is
seen ending within the right atrium. No acute osseous abnormalities
are seen.
IMPRESSION: Small left pleural effusion again seen. Vascular congestion, without
significant pulmonary edema. Mild cardiomegaly noted.

## 2016-02-01 DIAGNOSIS — N2581 Secondary hyperparathyroidism of renal origin: Secondary | ICD-10-CM | POA: Diagnosis not present

## 2016-02-01 DIAGNOSIS — N186 End stage renal disease: Secondary | ICD-10-CM | POA: Diagnosis not present

## 2016-02-01 DIAGNOSIS — I10 Essential (primary) hypertension: Secondary | ICD-10-CM | POA: Diagnosis not present

## 2016-02-01 DIAGNOSIS — D631 Anemia in chronic kidney disease: Secondary | ICD-10-CM | POA: Diagnosis not present

## 2016-02-01 DIAGNOSIS — Z418 Encounter for other procedures for purposes other than remedying health state: Secondary | ICD-10-CM | POA: Diagnosis not present

## 2016-02-01 DIAGNOSIS — E8779 Other fluid overload: Secondary | ICD-10-CM | POA: Diagnosis not present

## 2016-02-03 DIAGNOSIS — E8779 Other fluid overload: Secondary | ICD-10-CM | POA: Diagnosis not present

## 2016-02-03 DIAGNOSIS — N186 End stage renal disease: Secondary | ICD-10-CM | POA: Diagnosis not present

## 2016-02-03 DIAGNOSIS — D631 Anemia in chronic kidney disease: Secondary | ICD-10-CM | POA: Diagnosis not present

## 2016-02-03 DIAGNOSIS — Z418 Encounter for other procedures for purposes other than remedying health state: Secondary | ICD-10-CM | POA: Diagnosis not present

## 2016-02-03 DIAGNOSIS — I10 Essential (primary) hypertension: Secondary | ICD-10-CM | POA: Diagnosis not present

## 2016-02-03 DIAGNOSIS — N2581 Secondary hyperparathyroidism of renal origin: Secondary | ICD-10-CM | POA: Diagnosis not present

## 2016-02-04 ENCOUNTER — Emergency Department (HOSPITAL_COMMUNITY)
Admission: EM | Admit: 2016-02-04 | Discharge: 2016-02-05 | Disposition: A | Discharge status: Home / Self Care | Payer: Medicare Other | Attending: Physician Assistant | Admitting: Physician Assistant

## 2016-02-04 ENCOUNTER — Emergency Department (HOSPITAL_COMMUNITY): Payer: Medicare Other

## 2016-02-04 ENCOUNTER — Encounter (HOSPITAL_COMMUNITY): Payer: Self-pay

## 2016-02-04 DIAGNOSIS — Z79899 Other long term (current) drug therapy: Secondary | ICD-10-CM | POA: Insufficient documentation

## 2016-02-04 DIAGNOSIS — R079 Chest pain, unspecified: Secondary | ICD-10-CM | POA: Diagnosis not present

## 2016-02-04 DIAGNOSIS — F329 Major depressive disorder, single episode, unspecified: Secondary | ICD-10-CM | POA: Insufficient documentation

## 2016-02-04 DIAGNOSIS — R112 Nausea with vomiting, unspecified: Secondary | ICD-10-CM | POA: Diagnosis not present

## 2016-02-04 DIAGNOSIS — Y939 Activity, unspecified: Secondary | ICD-10-CM | POA: Insufficient documentation

## 2016-02-04 DIAGNOSIS — I5042 Chronic combined systolic (congestive) and diastolic (congestive) heart failure: Secondary | ICD-10-CM | POA: Insufficient documentation

## 2016-02-04 DIAGNOSIS — Y999 Unspecified external cause status: Secondary | ICD-10-CM | POA: Insufficient documentation

## 2016-02-04 DIAGNOSIS — Z87891 Personal history of nicotine dependence: Secondary | ICD-10-CM | POA: Diagnosis not present

## 2016-02-04 DIAGNOSIS — Y92410 Unspecified street and highway as the place of occurrence of the external cause: Secondary | ICD-10-CM | POA: Insufficient documentation

## 2016-02-04 DIAGNOSIS — N186 End stage renal disease: Secondary | ICD-10-CM | POA: Diagnosis not present

## 2016-02-04 DIAGNOSIS — E1122 Type 2 diabetes mellitus with diabetic chronic kidney disease: Secondary | ICD-10-CM | POA: Insufficient documentation

## 2016-02-04 DIAGNOSIS — R072 Precordial pain: Secondary | ICD-10-CM | POA: Diagnosis not present

## 2016-02-04 DIAGNOSIS — I132 Hypertensive heart and chronic kidney disease with heart failure and with stage 5 chronic kidney disease, or end stage renal disease: Secondary | ICD-10-CM | POA: Diagnosis not present

## 2016-02-04 DIAGNOSIS — R0602 Shortness of breath: Secondary | ICD-10-CM | POA: Diagnosis not present

## 2016-02-04 DIAGNOSIS — Z7984 Long term (current) use of oral hypoglycemic drugs: Secondary | ICD-10-CM | POA: Insufficient documentation

## 2016-02-04 DIAGNOSIS — R0789 Other chest pain: Secondary | ICD-10-CM

## 2016-02-04 LAB — BASIC METABOLIC PANEL
ANION GAP: 10 (ref 5–15)
BUN: 59 mg/dL — ABNORMAL HIGH (ref 6–20)
CALCIUM: 8.9 mg/dL (ref 8.9–10.3)
CHLORIDE: 101 mmol/L (ref 101–111)
CO2: 24 mmol/L (ref 22–32)
Creatinine, Ser: 9.9 mg/dL — ABNORMAL HIGH (ref 0.61–1.24)
GFR calc non Af Amer: 5 mL/min — ABNORMAL LOW (ref >60)
GFR, EST AFRICAN AMERICAN: 6 mL/min — AB (ref >60)
GLUCOSE: 80 mg/dL (ref 65–99)
POTASSIUM: 5.4 mmol/L — AB (ref 3.5–5.1)
Sodium: 135 mmol/L (ref 135–145)

## 2016-02-04 LAB — CBC
HCT: 24 % — ABNORMAL LOW (ref 39.0–52.0)
HEMOGLOBIN: 8 g/dL — AB (ref 13.0–17.0)
MCH: 28.3 pg (ref 26.0–34.0)
MCHC: 33.3 g/dL (ref 30.0–36.0)
MCV: 84.8 fL (ref 78.0–100.0)
Platelets: 196 10*3/uL (ref 150–400)
RBC: 2.83 MIL/uL — AB (ref 4.22–5.81)
RDW: 16.6 % — ABNORMAL HIGH (ref 11.5–15.5)
WBC: 7.2 10*3/uL (ref 4.0–10.5)

## 2016-02-04 LAB — I-STAT TROPONIN, ED: TROPONIN I, POC: 0.05 ng/mL (ref 0.00–0.08)

## 2016-02-04 IMAGING — US US SCROTUM
1 series · 14 of 25 positions shown · non-contrast
Comparison: Scrotal ultrasound 05/21/2015, 05/09/2015, 03/10/2015

CLINICAL DATA: Testicular pain and swelling for 3 days.

EXAM:
ULTRASOUND OF SCROTUM
TECHNIQUE: Complete ultrasound examination of the testicles, epididymis, and
other scrotal structures was performed.

[Series 1: us scrotum · 0.06mm/px · 14 of 46 slices shown]
[im 1/46]
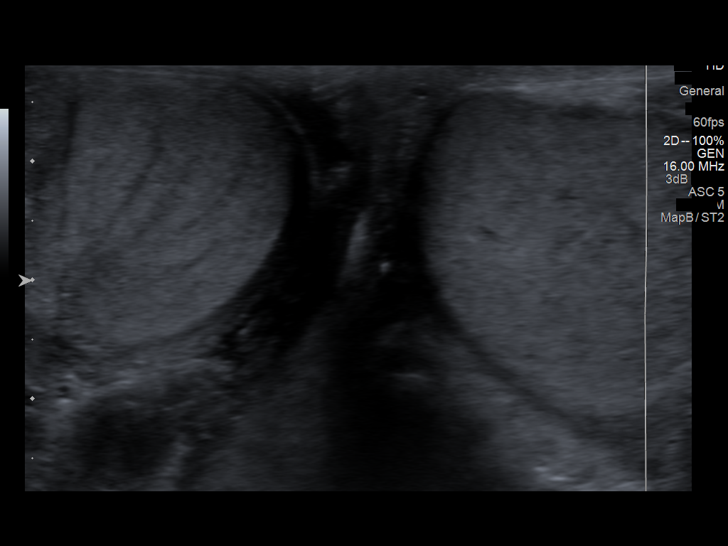
[im 4/46]
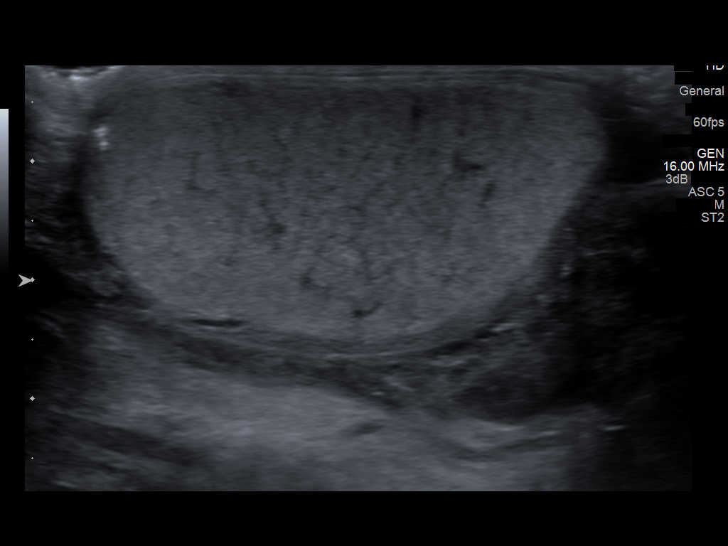
[im 8/46]
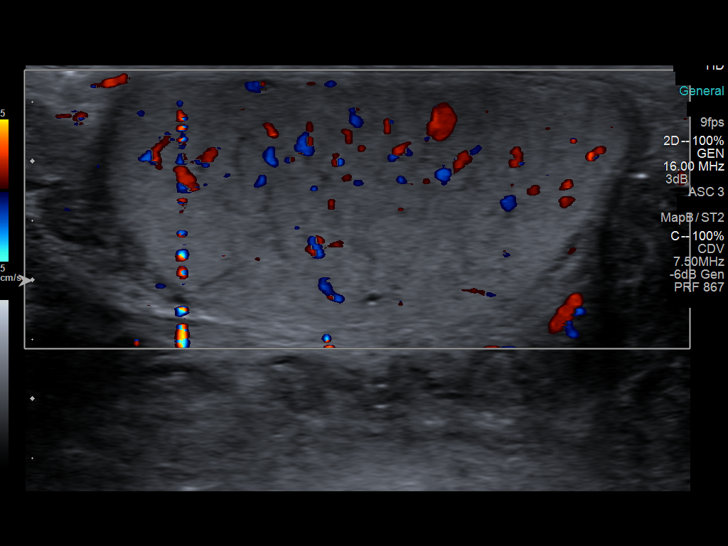
[im 12/46]
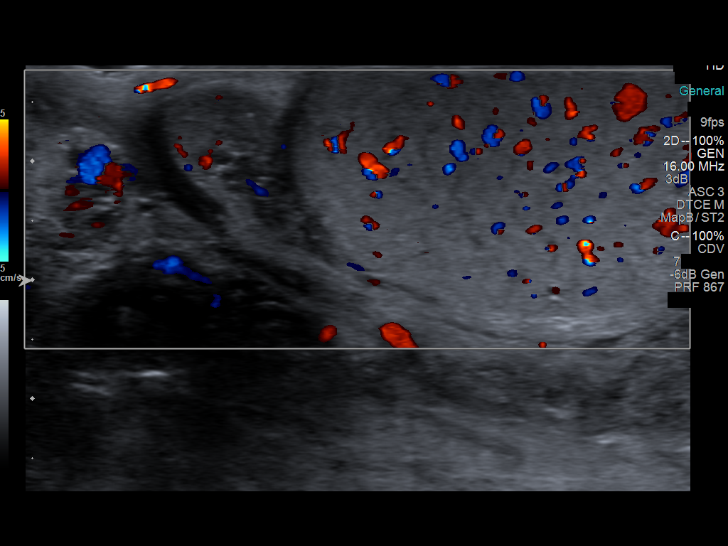
[im 16/46]
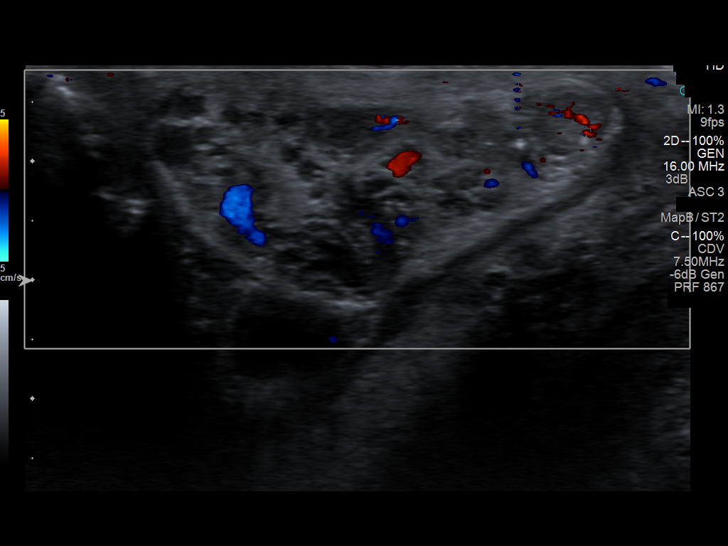
[im 17/46]
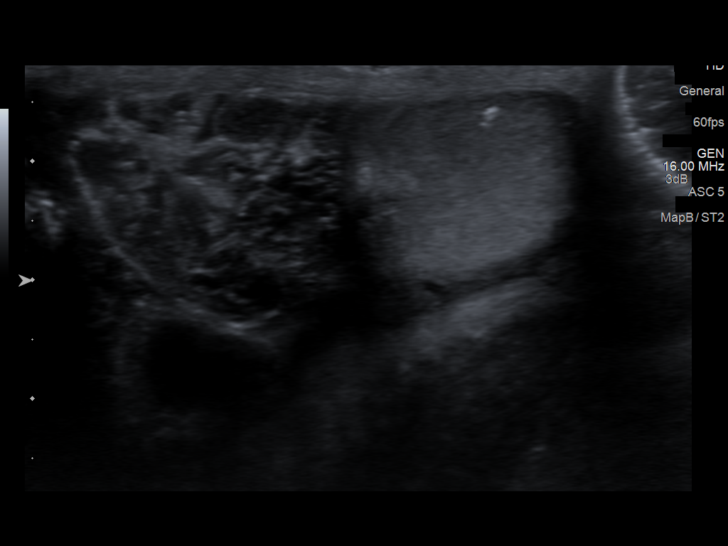
[im 21/46]
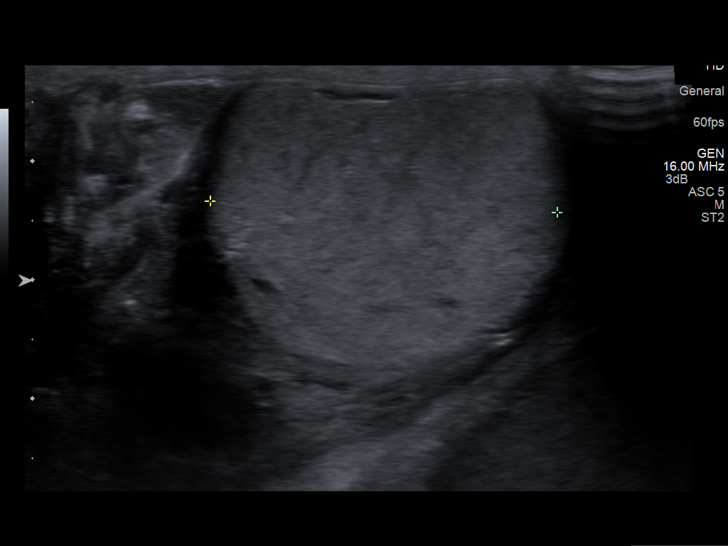
[im 25/46]
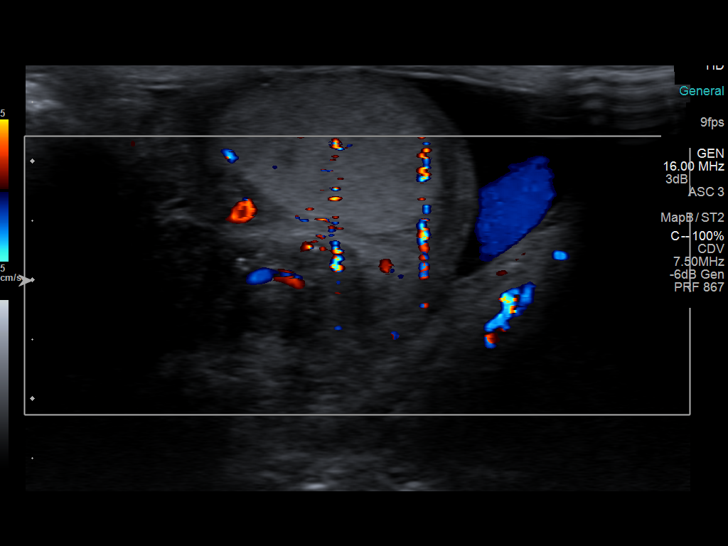
[im 29/46]
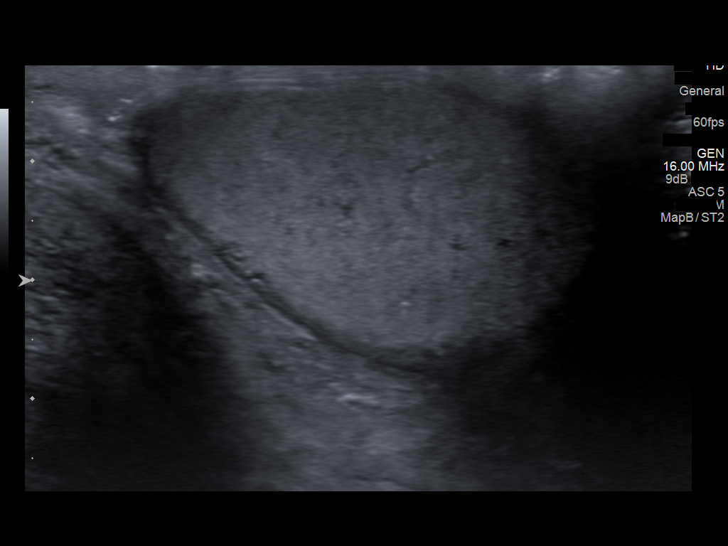
[im 31/46]
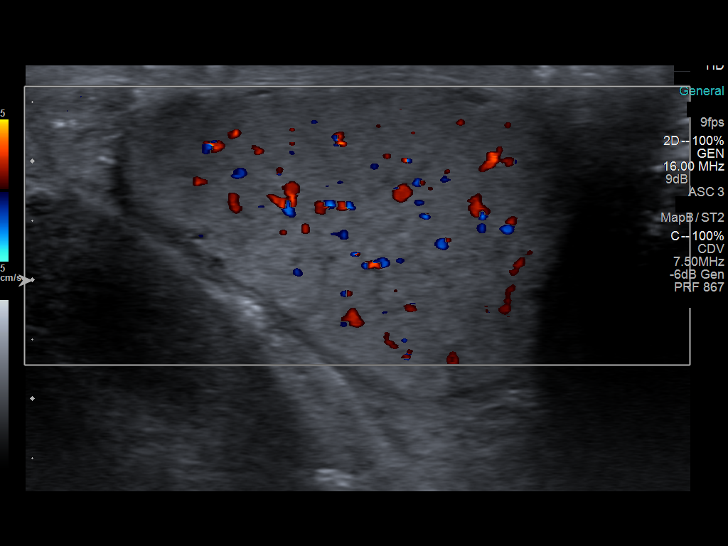
[im 34/46]
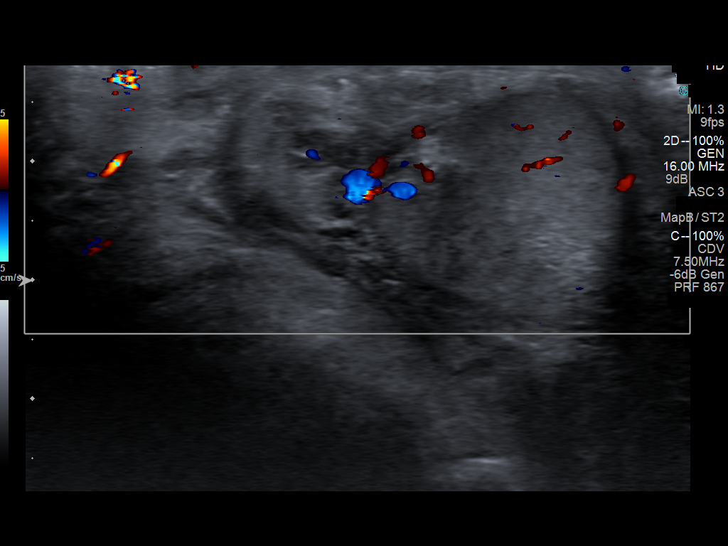
[im 38/46]
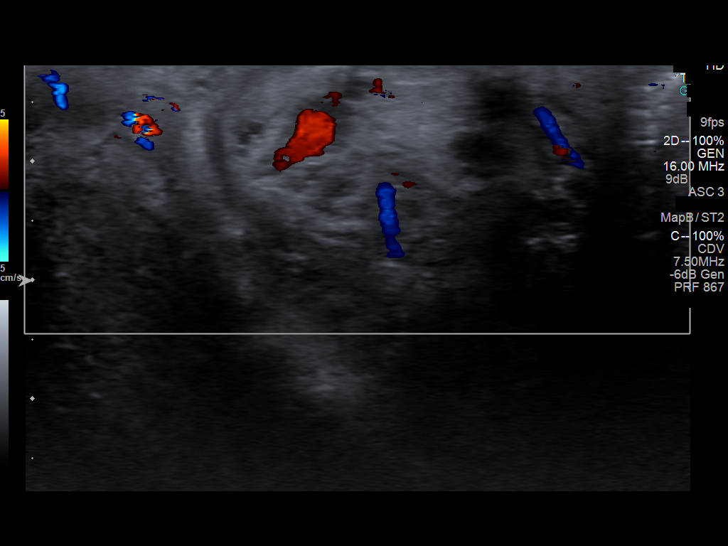
[im 42/46]
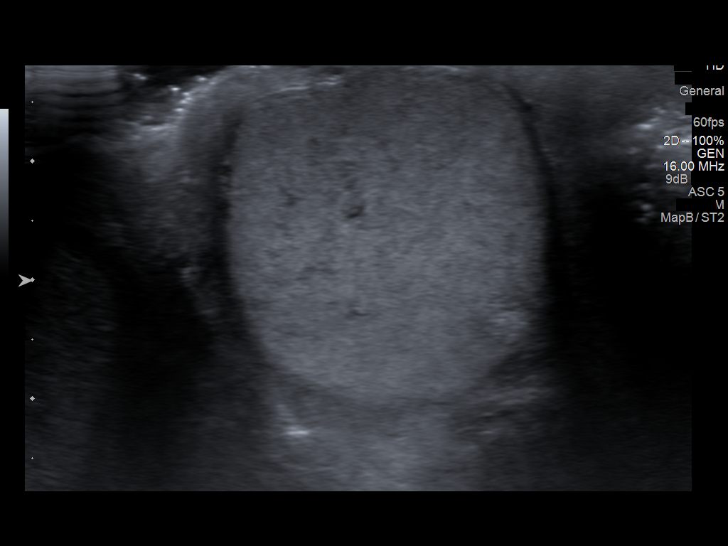
[im 46/46]
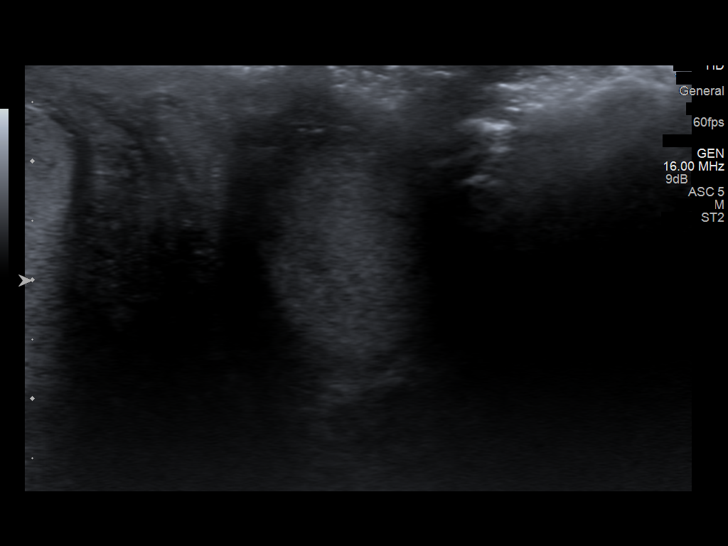

[14 of 25 positions shown; findings below may reference images not displayed]

FINDINGS: Right testicle

Measurements: 4.4 x 2.3 x 2.1 cm. Homogeneous echotexture. No mass
visualized. 1 mm calcification is noted, unchanged. Blood flow seen.

Left testicle

Measurements: 3.9 x 2.3 x 2.6 cm. Homogeneous echotexture. No mass
or microlithiasis visualized. Blood flow is seen.

Right epididymis: Prominent in size. Mild hyperemia persists but has
diminished from prior exam.

Left epididymis: Prominent in size. Mild hyperemia persist but has
diminished from prior exam.

Hydrocele:  None visualized.

Varicocele:  Left varicocele is noted.
IMPRESSION: 1. Chronic inflammatory changes about both epididymis, however
improved from prior exams.
2. Single right testicular calcification, stable. Testes otherwise
normal in sonographic appearance with blood flow.
3. Left varicocele.

## 2016-02-04 MED ORDER — ONDANSETRON HCL 4 MG PO TABS
4.0000 mg | ORAL_TABLET | Freq: Once | ORAL | Status: AC
  Administered 2016-02-04: 4 mg via ORAL
  Filled 2016-02-04: qty 1
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
## 2016-02-04 NOTE — ED Notes (Addendum)
Patient transported to X-ray 

## 2016-02-05 IMAGING — CR DG CHEST 1V PORT
1 series · 1 of 1 positions shown · non-contrast
Comparison: PA and lateral chest 09/11/2013.

CLINICAL DATA: Chest pain and shortness of breath.  Weakness.

EXAM:
PORTABLE CHEST - 1 VIEW

[AP]
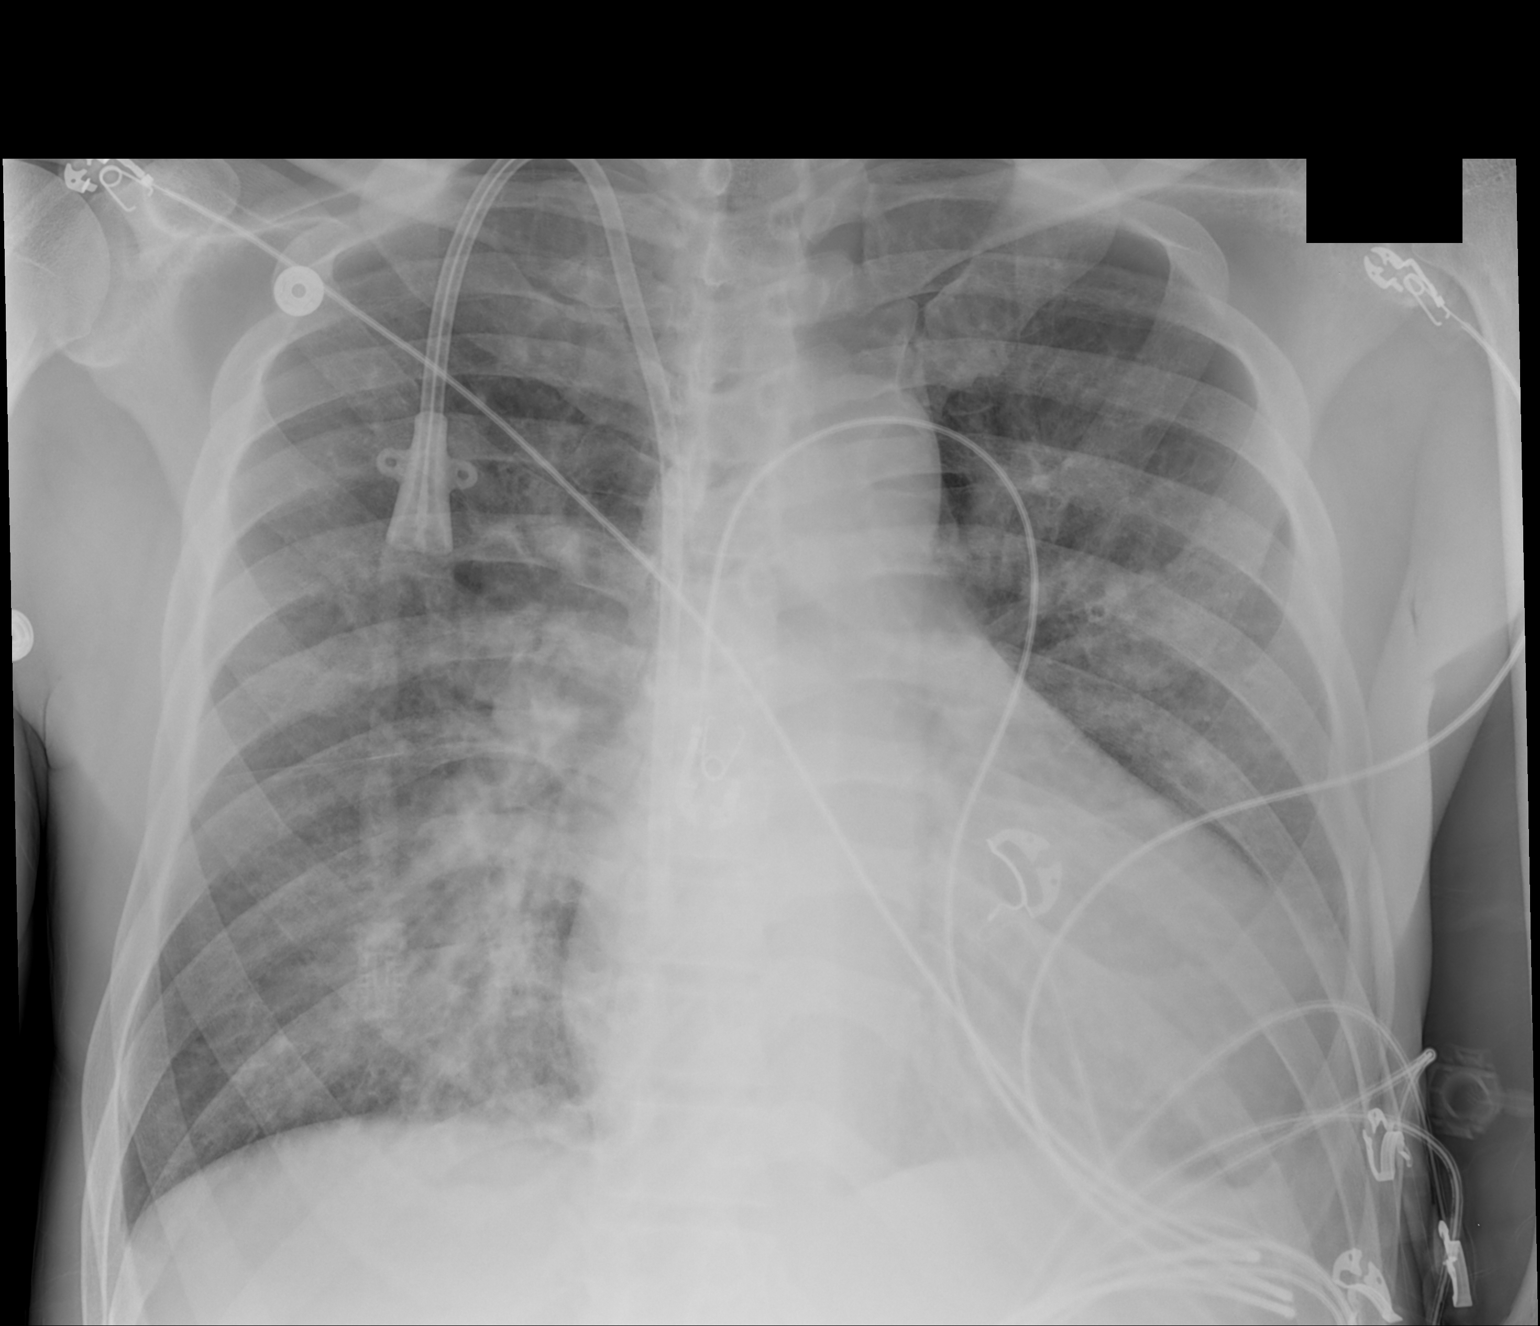

[1 of 1 positions shown; findings below may reference images not displayed]

FINDINGS: There is cardiomegaly and interstitial pulmonary edema. There also
appears to be a small left pleural effusion. No pneumothorax.
IMPRESSION: Cardiomegaly and pulmonary edema. Likely small effusion on the left
noted.

## 2016-02-05 NOTE — Discharge Instructions (Signed)
Please go to dialysis this morning as planned.   Chest Wall Pain Chest wall pain is pain in or around the bones and muscles of your chest. Sometimes, an injury causes this pain. Sometimes, the cause may not be known. This pain may take several weeks or longer to get better. HOME CARE INSTRUCTIONS  Pay attention to any changes in your symptoms. Take these actions to help with your pain:   Rest as told by your health care provider.   Avoid activities that cause pain. These include any activities that use your chest muscles or your abdominal and side muscles to lift heavy items.   If directed, apply ice to the painful area:  Put ice in a plastic bag.  Place a towel between your skin and the bag.  Leave the ice on for 20 minutes, 2-3 times per day.  Take over-the-counter and prescription medicines only as told by your health care provider.  Do not use tobacco products, including cigarettes, chewing tobacco, and e-cigarettes. If you need help quitting, ask your health care provider.  Keep all follow-up visits as told by your health care provider. This is important. SEEK MEDICAL CARE IF:  You have a fever.  Your chest pain becomes worse.  You have new symptoms. SEEK IMMEDIATE MEDICAL CARE IF:  You have nausea or vomiting.  You feel sweaty or light-headed.  You have a cough with phlegm (sputum) or you cough up blood.  You develop shortness of breath.   This information is not intended to replace advice given to you by your health care provider. Make sure you discuss any questions you have with your health care provider.   Document Released: 07/08/2005 Document Revised: 03/29/2015 Document Reviewed: 10/03/2014 Elsevier Interactive Patient Education [employer].

## 2016-02-06 DIAGNOSIS — Z7901 Long term (current) use of anticoagulants: Secondary | ICD-10-CM | POA: Diagnosis not present

## 2016-02-06 DIAGNOSIS — E8779 Other fluid overload: Secondary | ICD-10-CM | POA: Diagnosis not present

## 2016-02-06 DIAGNOSIS — D631 Anemia in chronic kidney disease: Secondary | ICD-10-CM | POA: Diagnosis not present

## 2016-02-06 DIAGNOSIS — N2581 Secondary hyperparathyroidism of renal origin: Secondary | ICD-10-CM | POA: Diagnosis not present

## 2016-02-06 DIAGNOSIS — Z418 Encounter for other procedures for purposes other than remedying health state: Secondary | ICD-10-CM | POA: Diagnosis not present

## 2016-02-06 DIAGNOSIS — I10 Essential (primary) hypertension: Secondary | ICD-10-CM | POA: Diagnosis not present

## 2016-02-06 DIAGNOSIS — N186 End stage renal disease: Secondary | ICD-10-CM | POA: Diagnosis not present

## 2016-02-08 DIAGNOSIS — Z418 Encounter for other procedures for purposes other than remedying health state: Secondary | ICD-10-CM | POA: Diagnosis not present

## 2016-02-08 DIAGNOSIS — D631 Anemia in chronic kidney disease: Secondary | ICD-10-CM | POA: Diagnosis not present

## 2016-02-08 DIAGNOSIS — E8779 Other fluid overload: Secondary | ICD-10-CM | POA: Diagnosis not present

## 2016-02-08 DIAGNOSIS — I10 Essential (primary) hypertension: Secondary | ICD-10-CM | POA: Diagnosis not present

## 2016-02-08 DIAGNOSIS — N2581 Secondary hyperparathyroidism of renal origin: Secondary | ICD-10-CM | POA: Diagnosis not present

## 2016-02-08 DIAGNOSIS — N186 End stage renal disease: Secondary | ICD-10-CM | POA: Diagnosis not present

## 2016-02-09 DIAGNOSIS — N186 End stage renal disease: Secondary | ICD-10-CM | POA: Diagnosis not present

## 2016-02-09 DIAGNOSIS — Z992 Dependence on renal dialysis: Secondary | ICD-10-CM | POA: Diagnosis not present

## 2016-02-09 DIAGNOSIS — N2889 Other specified disorders of kidney and ureter: Secondary | ICD-10-CM | POA: Diagnosis not present

## 2016-02-09 DIAGNOSIS — G8929 Other chronic pain: Secondary | ICD-10-CM | POA: Diagnosis not present

## 2016-02-10 ENCOUNTER — Emergency Department (HOSPITAL_COMMUNITY)
Admission: EM | Admit: 2016-02-10 | Discharge: 2016-02-11 | Disposition: A | Discharge status: Home / Self Care | Payer: Medicare Other | Attending: Emergency Medicine | Admitting: Emergency Medicine

## 2016-02-10 ENCOUNTER — Emergency Department (HOSPITAL_COMMUNITY): Payer: Medicare Other

## 2016-02-10 ENCOUNTER — Encounter (HOSPITAL_COMMUNITY): Payer: Self-pay | Admitting: Emergency Medicine

## 2016-02-10 DIAGNOSIS — R55 Syncope and collapse: Secondary | ICD-10-CM | POA: Diagnosis not present

## 2016-02-10 DIAGNOSIS — Z5321 Procedure and treatment not carried out due to patient leaving prior to being seen by health care provider: Secondary | ICD-10-CM | POA: Insufficient documentation

## 2016-02-10 DIAGNOSIS — R001 Bradycardia, unspecified: Secondary | ICD-10-CM | POA: Diagnosis not present

## 2016-02-10 DIAGNOSIS — R42 Dizziness and giddiness: Secondary | ICD-10-CM | POA: Insufficient documentation

## 2016-02-10 DIAGNOSIS — I499 Cardiac arrhythmia, unspecified: Secondary | ICD-10-CM | POA: Diagnosis not present

## 2016-02-10 LAB — CBG MONITORING, ED: GLUCOSE-CAPILLARY: 128 mg/dL — AB (ref 65–99)

## 2016-02-10 MED ORDER — PROMETHAZINE HCL 25 MG/ML IJ SOLN
25.0000 mg | Freq: Once | INTRAMUSCULAR | Status: AC
  Administered 2016-02-10: 25 mg via INTRAVENOUS
  Filled 2016-02-10: qty 1

## 2016-02-10 NOTE — ED Notes (Signed)
Checked CBG 128, RN Brook informed

## 2016-02-10 NOTE — ED Notes (Addendum)
Pt states he does not make urine any longer and to get the urinal away from him

## 2016-02-10 NOTE — ED Notes (Signed)
Robin, EMT attempting to get EKG on patient at this time. Patient is being very difficult, asking [place] to wait a minute until his nausea calms down and because he is too cold.

## 2016-02-10 NOTE — ED Provider Notes (Signed)
CSN: [id number]     Arrival date & time 02/10/16  2149 History   First MD Initiated Contact with Patient 02/10/16 2218     Chief Complaint  Patient presents with  . Loss of Consciousness     (Consider location/radiation/quality/duration/timing/severity/associated sxs/prior Treatment) HPI Patient presents to the emergency department with feeling of weakness, dizziness, nauseous, weakness, shortness of breath over the last 4 hours.  The patient states that he had dialysis today and he is been feeling weak since that was completed patient states that he is also having chest discomfort.  States that he has had for quite a while.  The patient is not very cooperative during the history of present illness, he will only answer questions, then I have answered so far.  The patient calls me back into the room and states that his main complaint now is dizziness and nauseousness Past Medical History  Diagnosis Date  . Hypertension   . Depression   . Complication of anesthesia     itching, sore throat  . Shortness of breath   . Anxiety   . Chronic combined systolic and diastolic CHF (congestive heart failure) (HCC)     a. EF 20-25% by echo in 08/2015 b. echo 10/2015: EF 35-40%, diffuse HK, severe LAE, moderate RAE, small pericardial effusion  . Anemia   . Dialysis patient ([place])   . Type II diabetes mellitus (HCC)     No history per patient, but remains under history as A1c would not be accurate given on dialysis  . ESRD (end stage renal disease) ([place]     due to HTN per patient, followed at [facility], s/p failed kidney transplant - dialysis Tue, Th, Sat  . Renal insufficiency   . Hyperkalemia 12/2015  . Nonischemic cardiomyopathy ([place])     a. 08/2014: cath showing minimal CAD, but tortuous arteries noted.   . Junctional rhythm     a. noted in 08/2015: hyperkalemic at that time  b. 12/2015: presented in junctional rhythm w/ K+ of 6.6. Resolved with improvement of K+ levels.   Past Surgical History   Procedure Laterality Date  . Kidney receipient  2006    failed and started HD in March 2014  . Capd insertion    . Capd removal    . Left heart catheterization with coronary angiogram N/A 09/02/2014    Procedure: LEFT HEART CATHETERIZATION WITH CORONARY ANGIOGRAM;  Surgeon: [name], MD;  Location: [facility] CATH LAB;  Service: Cardiovascular;  Laterality: N/A;  . Inguinal hernia repair Right 02/14/2015    Procedure: REPAIR INCARCERATED RIGHT INGUINAL HERNIA;  Surgeon: [name], MD;  Location: [place];  Service: General;  Laterality: Right;  . Insertion of dialysis catheter Right 09/23/2015    Procedure: exchange of Right internal Dialysis Catheter.;  Surgeon: [name], MD;  Location: [facility] OR;  Service: Vascular;  Laterality: Right;   Family History  Problem Relation Age of Onset  . Hypertension Other    Social History  Substance Use Topics  . Smoking status: Former Smoker -- 0.00 packs/day for 1 years    Types: Cigarettes  . Smokeless tobacco: Never Used     Comment: quit Jan 2014  . Alcohol Use: No    Review of Systems  Level V caveat applies due to uncooperativeness  Allergies  Butalbital-apap-caffeine; Ferrlecit; Minoxidil; and Darvocet  Home Medications   Prior to Admission medications   Medication Sig Start Date End Date Taking? Authorizing Provider  allopurinol (ZYLOPRIM) 100 MG tablet  Take 100 mg by mouth daily.    Historical Provider, MD  amLODipine (NORVASC) 10 MG tablet Take 10 mg by mouth daily.    Historical Provider, MD  atorvastatin (LIPITOR) 40 MG tablet Take 1 tablet (40 mg total) by mouth daily at 6 PM. 09/02/14   Barton Dubois, MD  AURYXIA 1 GM 210 MG(Fe) TABS Take 420 mg by mouth 3 (three) times daily. 01/26/16   Historical Provider, MD  carvedilol (COREG) 12.5 MG tablet Take 1 tablet (12.5 mg total) by mouth 2 (two) times daily with a meal. Patient not taking: Reported on 01/08/2016 11/04/15   [name], MD  carvedilol (COREG) 25 MG tablet Take 25 mg  by mouth 2 (two) times daily with a meal.    Historical Provider, MD  cinacalcet (SENSIPAR) 30 MG tablet Take 30 mg by mouth daily.    Historical Provider, MD  cloNIDine (CATAPRES - DOSED IN MG/24 HR) 0.3 mg/24hr patch Place 0.3 mg onto the skin once a week.    Historical Provider, MD  doxazosin (CARDURA) 2 MG tablet Take 2 mg by mouth daily.    Historical Provider, MD  gabapentin (NEURONTIN) 100 MG capsule Take 100 mg by mouth 3 (three) times daily.    Historical Provider, MD  hydrALAZINE (APRESOLINE) 100 MG tablet Take 100 mg by mouth every 8 (eight) hours.    Historical Provider, MD  isosorbide mononitrate (IMDUR) 60 MG 24 hr tablet Take 1 tablet (60 mg total) by mouth daily. 01/09/16   Smiley Houseman, MD  omeprazole (PRILOSEC) 20 MG capsule Take 20 mg by mouth daily. 07/01/13   Historical Provider, MD  oxyCODONE-acetaminophen (PERCOCET/ROXICET) 5-325 MG tablet Take 1-2 tablets by mouth every 6 (six) hours as needed for severe pain. 11/13/15   Carlisle Cater, PA-C  predniSONE (DELTASONE) 10 MG tablet Take 10 mg by mouth daily with breakfast.    Historical Provider, MD  sevelamer carbonate (RENVELA) 800 MG tablet Take 800-1,600 mg by mouth 3 (three) times daily with meals. 2 tabs three times daily with meals, and 1 tablet with snacks    Historical Provider, MD  warfarin (COUMADIN) 2 MG tablet Take 2 mg by mouth daily.  01/31/16   Historical Provider, MD  warfarin (COUMADIN) 5 MG tablet  01/31/16   Historical Provider, MD   BP 97/63 mmHg  Pulse 149  Resp 21  Ht _0  (1.88 m)  Wt 72.576 kg  BMI 20.53 kg/m2  SpO2 92% Physical Exam  Constitutional: He is oriented to person, place, and time. He appears well-developed and well-nourished. No distress.  HENT:  Head: Normocephalic and atraumatic.  Mouth/Throat: Oropharynx is clear and moist.  Eyes: Pupils are equal, round, and reactive to light.  Neck: Normal range of motion. Neck supple.  Cardiovascular: Regular rhythm and normal heart sounds.   Bradycardia present.  Exam reveals no gallop and no friction rub.   No murmur heard. Pulmonary/Chest: Effort normal and breath sounds normal. No respiratory distress. He has no wheezes.  Abdominal: Soft. Bowel sounds are normal. He exhibits no distension. There is no tenderness.  Neurological: He is alert and oriented to person, place, and time. He exhibits normal muscle tone. Coordination normal.  Skin: Skin is warm and dry. No rash noted. No erythema.  Psychiatric: He has a normal mood and affect. His behavior is normal.  Nursing note and vitals reviewed.   ED Course  Procedures (including critical care time) Labs Review Labs Reviewed  CBG MONITORING, ED - Abnormal;  Notable for the following:    Glucose-Capillary 128 (*)    All other components within normal limits  BASIC METABOLIC PANEL  CBC  URINALYSIS, ROUTINE W REFLEX MICROSCOPIC (NOT AT [facility])  PROTIME-INR    Imaging Review No results found. I have personally reviewed and evaluated these images and lab results as part of my medical decision-making.   EKG Interpretation None      I went and reassessed the patient is heart rate is now in the 70s.  Patient states that he has had this happen before after dialysis. The patient refuses to have his blood drawn.  He states that all he needs is something for his nausea.  The patient would like something eating, drinking advised the nurse that he can be discharged soon as he is completed that   Dalia Heading, PA-C 02/12/16 [place], MD 02/12/16 [id number]

## 2016-02-10 NOTE — ED Notes (Signed)
PER [facility]: Patient to ED from hotel following syncopal episode - was lying on floor at hotel, upon EMS arrival was pale and slightly diaphoretic. Pt had episode of vomiting and diarrhea PTA. Per EMS, pt had near syncopal episode earlier today while outside and refused to come in after being evaluated by EMS. Pt is Tuesday/Thursday/Saturday dialysis pt, last appointment this morning and everything was fine. EMS VS: HR 56 SB, 136/91, 100% RA, CBG 221. Patient A&O x 4.

## 2016-02-10 NOTE — ED Notes (Signed)
Patient began yelling out for help, this RN went into room and said he needed his call bell close to him so he can call for help. RN showed patient the call bell, which was right beside him. Patient immediately called out. Pt stated he was dizzy and nauseous. PA-C aware.

## 2016-02-10 NOTE — ED Notes (Signed)
Gerald Stabs, PA-C at bedside, aware of patient's HR.

## 2016-02-10 NOTE — ED Notes (Signed)
This RN attempted to pull blood off of patient's IV. IV blew, explained to patient that I needed to get another IV. Patient arguing about patient and I not getting along. This RN took out patient's IV, and began looking for another one. Patient said no to everywhere on his arm except where EMS blew his first IV, in which I told him I was not comfortable going in the same place. Patient again stated "I'm getting upset. I don't know why, but I'm getting real upset." Patient stated that I was giving him attitude. RN asked what I did that made him feel like that and he said that he had asked me for phenergan and I told him no. Patient did not ask me for phenergan and upon attempting to explain that, patient said "you and me are not getting along." RN excused herself from the room.

## 2016-02-11 ENCOUNTER — Emergency Department (HOSPITAL_COMMUNITY): Payer: Medicare Other

## 2016-02-11 DIAGNOSIS — R55 Syncope and collapse: Secondary | ICD-10-CM | POA: Diagnosis not present

## 2016-02-11 MED ORDER — PROMETHAZINE HCL 25 MG PO TABS: 25.0000 mg | ORAL_TABLET | Freq: Three times a day (TID) | ORAL | Status: DC | PRN

## 2016-02-11 NOTE — Discharge Instructions (Signed)
Return here as needed.  Follow-up with your primary care doctor °

## 2016-02-12 DIAGNOSIS — N2581 Secondary hyperparathyroidism of renal origin: Secondary | ICD-10-CM | POA: Diagnosis not present

## 2016-02-12 DIAGNOSIS — N186 End stage renal disease: Secondary | ICD-10-CM | POA: Diagnosis not present

## 2016-02-12 DIAGNOSIS — Z418 Encounter for other procedures for purposes other than remedying health state: Secondary | ICD-10-CM | POA: Diagnosis not present

## 2016-02-12 DIAGNOSIS — D631 Anemia in chronic kidney disease: Secondary | ICD-10-CM | POA: Diagnosis not present

## 2016-02-12 DIAGNOSIS — I10 Essential (primary) hypertension: Secondary | ICD-10-CM | POA: Diagnosis not present

## 2016-02-12 DIAGNOSIS — E8779 Other fluid overload: Secondary | ICD-10-CM | POA: Diagnosis not present

## 2016-02-13 ENCOUNTER — Emergency Department (HOSPITAL_COMMUNITY)
Admission: EM | Admit: 2016-02-13 | Discharge: 2016-02-13 | Disposition: A | Discharge status: Home / Self Care | Payer: Medicare Other | Attending: Emergency Medicine | Admitting: Emergency Medicine

## 2016-02-13 ENCOUNTER — Encounter (HOSPITAL_COMMUNITY): Payer: Self-pay | Admitting: Emergency Medicine

## 2016-02-13 DIAGNOSIS — I5042 Chronic combined systolic (congestive) and diastolic (congestive) heart failure: Secondary | ICD-10-CM | POA: Insufficient documentation

## 2016-02-13 DIAGNOSIS — Z87891 Personal history of nicotine dependence: Secondary | ICD-10-CM | POA: Diagnosis not present

## 2016-02-13 DIAGNOSIS — R0600 Dyspnea, unspecified: Secondary | ICD-10-CM

## 2016-02-13 DIAGNOSIS — R1084 Generalized abdominal pain: Secondary | ICD-10-CM | POA: Diagnosis not present

## 2016-02-13 DIAGNOSIS — N186 End stage renal disease: Secondary | ICD-10-CM | POA: Diagnosis not present

## 2016-02-13 DIAGNOSIS — E8779 Other fluid overload: Secondary | ICD-10-CM | POA: Diagnosis not present

## 2016-02-13 DIAGNOSIS — E1122 Type 2 diabetes mellitus with diabetic chronic kidney disease: Secondary | ICD-10-CM | POA: Insufficient documentation

## 2016-02-13 DIAGNOSIS — Z7901 Long term (current) use of anticoagulants: Secondary | ICD-10-CM | POA: Diagnosis not present

## 2016-02-13 DIAGNOSIS — R531 Weakness: Secondary | ICD-10-CM | POA: Diagnosis not present

## 2016-02-13 DIAGNOSIS — D631 Anemia in chronic kidney disease: Secondary | ICD-10-CM | POA: Diagnosis not present

## 2016-02-13 DIAGNOSIS — I132 Hypertensive heart and chronic kidney disease with heart failure and with stage 5 chronic kidney disease, or end stage renal disease: Secondary | ICD-10-CM | POA: Diagnosis not present

## 2016-02-13 DIAGNOSIS — I10 Essential (primary) hypertension: Secondary | ICD-10-CM | POA: Diagnosis not present

## 2016-02-13 DIAGNOSIS — Z418 Encounter for other procedures for purposes other than remedying health state: Secondary | ICD-10-CM | POA: Diagnosis not present

## 2016-02-13 DIAGNOSIS — N2581 Secondary hyperparathyroidism of renal origin: Secondary | ICD-10-CM | POA: Diagnosis not present

## 2016-02-13 LAB — CBC
HCT: 23.6 % — ABNORMAL LOW (ref 39.0–52.0)
Hemoglobin: 7.2 g/dL — ABNORMAL LOW (ref 13.0–17.0)
MCH: 26.7 pg (ref 26.0–34.0)
MCHC: 30.5 g/dL (ref 30.0–36.0)
MCV: 87.4 fL (ref 78.0–100.0)
PLATELETS: 210 10*3/uL (ref 150–400)
RBC: 2.7 MIL/uL — AB (ref 4.22–5.81)
RDW: 16 % — ABNORMAL HIGH (ref 11.5–15.5)
WBC: 6.7 10*3/uL (ref 4.0–10.5)

## 2016-02-13 LAB — COMPREHENSIVE METABOLIC PANEL
ALK PHOS: 130 U/L — AB (ref 38–126)
ALT: 9 U/L — AB (ref 17–63)
AST: 29 U/L (ref 15–41)
Albumin: 2.9 g/dL — ABNORMAL LOW (ref 3.5–5.0)
Anion gap: 9 (ref 5–15)
BUN: 33 mg/dL — ABNORMAL HIGH (ref 6–20)
CALCIUM: 8.5 mg/dL — AB (ref 8.9–10.3)
CO2: 25 mmol/L (ref 22–32)
CREATININE: 8.64 mg/dL — AB (ref 0.61–1.24)
Chloride: 105 mmol/L (ref 101–111)
GFR calc non Af Amer: 6 mL/min — ABNORMAL LOW (ref >60)
GFR, EST AFRICAN AMERICAN: 7 mL/min — AB (ref >60)
GLUCOSE: 103 mg/dL — AB (ref 65–99)
Potassium: 5.3 mmol/L — ABNORMAL HIGH (ref 3.5–5.1)
SODIUM: 139 mmol/L (ref 135–145)
Total Bilirubin: 0.4 mg/dL (ref 0.3–1.2)
Total Protein: 7.8 g/dL (ref 6.5–8.1)

## 2016-02-13 LAB — LIPASE, BLOOD: Lipase: 32 U/L (ref 11–51)

## 2016-02-13 MED ORDER — ONDANSETRON HCL 4 MG/2ML IJ SOLN
4.0000 mg | Freq: Once | INTRAMUSCULAR | Status: AC
  Administered 2016-02-13: 4 mg via INTRAVENOUS
  Filled 2016-02-13: qty 2

## 2016-02-13 MED ORDER — GABAPENTIN 100 MG PO CAPS
100.0000 mg | ORAL_CAPSULE | Freq: Once | ORAL | Status: AC
  Administered 2016-02-13: 100 mg via ORAL
  Filled 2016-02-13: qty 1

## 2016-02-13 MED ORDER — FAMOTIDINE 20 MG PO TABS: 20.0000 mg | ORAL_TABLET | Freq: Two times a day (BID) | ORAL | 0 refills | Status: DC

## 2016-02-13 MED ORDER — FAMOTIDINE IN NACL 20-0.9 MG/50ML-% IV SOLN
20.0000 mg | Freq: Once | INTRAVENOUS | Status: AC
  Administered 2016-02-13: 20 mg via INTRAVENOUS
  Filled 2016-02-13: qty 50

## 2016-02-13 MED ORDER — CARVEDILOL 12.5 MG PO TABS
25.0000 mg | ORAL_TABLET | Freq: Two times a day (BID) | ORAL | Status: DC
  Administered 2016-02-13: 25 mg via ORAL
  Filled 2016-02-13: qty 2

## 2016-02-13 MED ORDER — ONDANSETRON 4 MG PO TBDP: ORAL_TABLET | ORAL | 0 refills | Status: DC

## 2016-02-13 MED ORDER — ALPRAZOLAM 0.5 MG PO TABS
0.5000 mg | ORAL_TABLET | Freq: Once | ORAL | Status: AC
  Administered 2016-02-13: 0.5 mg via ORAL
  Filled 2016-02-13: qty 1

## 2016-02-13 NOTE — ED Triage Notes (Signed)
Per EMS, pt from home with c/o weakness. Per pt, he had dialysis 2 days in a row, both times he had "9 lbs of fluid" removed.

## 2016-02-13 NOTE — ED Provider Notes (Signed)
[facility] DEPT Provider Note   CSN: [id number] Arrival date & time: 02/13/16  2008  First Provider Contact:  None       History   Chief Complaint Chief Complaint  Patient presents with  . Weakness  . Abdominal Pain    HPI Frank [name] is a 50 y.o. male.  HPI Patient reports he was on the way to a grocery store when he very quickly started to feel like his throat was closing and he was having to take deep breaths. He states he felt lightheaded and also developed generalized abdominal pain. He reports subsequent to that,  he began vomiting. Patient reports all the symptoms came on very quickly. He reports he did complete dialysis today without any unusual symptoms. He has not had fever or cough leading up to this. He denies he was having diarrhea or vomiting leading up to the symptoms. Past Medical History:  Diagnosis Date  . Anemia   . Anxiety   . Chronic combined systolic and diastolic CHF (congestive heart failure) (HCC)    a. EF 20-25% by echo in 08/2015 b. echo 10/2015: EF 35-40%, diffuse HK, severe LAE, moderate RAE, small pericardial effusion  . Complication of anesthesia    itching, sore throat  . Depression   . Dialysis patient ([place])   . ESRD (end stage renal disease) ([place])    due to HTN per patient, followed at [facility], s/p failed kidney transplant - dialysis Tue, Th, Sat  . Hyperkalemia 12/2015  . Hypertension   . Junctional rhythm    a. noted in 08/2015: hyperkalemic at that time  b. 12/2015: presented in junctional rhythm w/ K+ of 6.6. Resolved with improvement of K+ levels.  . Nonischemic cardiomyopathy ([place])    a. 08/2014: cath showing minimal CAD, but tortuous arteries noted.   . Renal insufficiency   . Shortness of breath   . Type II diabetes mellitus (HCC)    No history per patient, but remains under history as A1c would not be accurate given on dialysis    Patient Active Problem List   Diagnosis Date Noted  . Nonischemic cardiomyopathy ([place])  01/09/2016  . Chronic abdominal pain   . ESRD (end stage renal disease) on dialysis ([place])   . SOB (shortness of breath)   . Generalized abdominal pain   . Faintness   . Dyspnea 01/07/2016  . Hyperkalemia 12/16/2015  . Bilateral low back pain without sciatica   . Pericardial effusion 10/30/2015  . Left renal mass 10/30/2015  . Constipation 10/30/2015  . Hypertensive urgency 09/08/2015  . Adjustment disorder with mixed anxiety and depressed mood 08/20/2015  . Chronic epididymitis 06/19/2015  . Groin pain, chronic, right 06/19/2015  . Incarcerated right inguinal hernia 02/16/2015  . Malnutrition of moderate degree ([place]) 02/03/2015  . Left upper extremity deep vein thrombosis ([place]) 02/01/2015  . Drug-seeking behavior 02/01/2015  . Anemia of chronic disease   . Dyslipidemia   . Chronic pulmonary embolism ([place])   . History of noncompliance with medical treatment   . Chronic combined systolic and diastolic CHF (congestive heart failure) ([place])   . ESRD on dialysis- Tues, Thurs, Sat in [place], nephro: [name] 04/13/2013    Past Surgical History:  Procedure Laterality Date  . CAPD INSERTION    . CAPD REMOVAL    . INGUINAL HERNIA REPAIR Right 02/14/2015   Procedure: REPAIR INCARCERATED RIGHT INGUINAL HERNIA;  Surgeon: [name], MD;  Location: [facility];  Service: General;  Laterality: Right;  .  INSERTION OF DIALYSIS CATHETER Right 09/23/2015   Procedure: exchange of Right internal Dialysis Catheter.;  Surgeon: [name], MD;  Location: [facility];  Service: Vascular;  Laterality: Right;  . KIDNEY RECEIPIENT  2006   failed and started HD in March 2014  . LEFT HEART CATHETERIZATION WITH CORONARY ANGIOGRAM N/A 09/02/2014   Procedure: LEFT HEART CATHETERIZATION WITH CORONARY ANGIOGRAM;  Surgeon: [name], MD;  Location: [facility] CATH LAB;  Service: Cardiovascular;  Laterality: N/A;       Home Medications    Prior to Admission medications   Medication Sig Start Date End Date  Taking? Authorizing Provider  allopurinol (ZYLOPRIM) 100 MG tablet Take 100 mg by mouth daily.    Historical Provider, MD  amLODipine (NORVASC) 10 MG tablet Take 10 mg by mouth daily.    Historical Provider, MD  atorvastatin (LIPITOR) 40 MG tablet Take 1 tablet (40 mg total) by mouth daily at 6 PM. 09/02/14   Barton Dubois, MD  AURYXIA 1 GM 210 MG(Fe) TABS Take 420 mg by mouth 3 (three) times daily. 01/26/16   Historical Provider, MD  carvedilol (COREG) 12.5 MG tablet Take 1 tablet (12.5 mg total) by mouth 2 (two) times daily with a meal. Patient not taking: Reported on 01/08/2016 11/04/15   [name], MD  carvedilol (COREG) 25 MG tablet Take 25 mg by mouth 2 (two) times daily with a meal.    Historical Provider, MD  cinacalcet (SENSIPAR) 30 MG tablet Take 30 mg by mouth daily.    Historical Provider, MD  cloNIDine (CATAPRES - DOSED IN MG/24 HR) 0.3 mg/24hr patch Place 0.3 mg onto the skin once a week.    Historical Provider, MD  doxazosin (CARDURA) 2 MG tablet Take 2 mg by mouth daily.    Historical Provider, MD  famotidine (PEPCID) 20 MG tablet Take 1 tablet (20 mg total) by mouth 2 (two) times daily. 02/13/16   Charlesetta Shanks, MD  gabapentin (NEURONTIN) 100 MG capsule Take 100 mg by mouth 3 (three) times daily.    Historical Provider, MD  hydrALAZINE (APRESOLINE) 100 MG tablet Take 100 mg by mouth every 8 (eight) hours.    Historical Provider, MD  isosorbide mononitrate (IMDUR) 60 MG 24 hr tablet Take 1 tablet (60 mg total) by mouth daily. 01/09/16   Smiley Houseman, MD  omeprazole (PRILOSEC) 20 MG capsule Take 20 mg by mouth daily. 07/01/13   Historical Provider, MD  ondansetron (ZOFRAN ODT) 4 MG disintegrating tablet 41m ODT q4 hours prn nausea/vomit 02/13/16   MCharlesetta Shanks MD  oxyCODONE-acetaminophen (PERCOCET/ROXICET) 5-325 MG tablet Take 1-2 tablets by mouth every 6 (six) hours as needed for severe pain. 11/13/15   JCarlisle Cater PA-C  predniSONE (DELTASONE) 10 MG tablet Take 10 mg by  mouth daily with breakfast.    Historical Provider, MD  promethazine (PHENERGAN) 25 MG tablet Take 1 tablet (25 mg total) by mouth every 8 (eight) hours as needed for nausea or vomiting. 02/11/16   CDalia Heading PA-C  sevelamer carbonate (RENVELA) 800 MG tablet Take 800-1,600 mg by mouth 3 (three) times daily with meals. 2 tabs three times daily with meals, and 1 tablet with snacks    Historical Provider, MD  warfarin (COUMADIN) 2 MG tablet Take 2 mg by mouth daily.  01/31/16   Historical Provider, MD  warfarin (COUMADIN) 5 MG tablet  01/31/16   Historical Provider, MD    Family History Family History  Problem Relation Age of Onset  .  Hypertension Other     Social History Social History  Substance Use Topics  . Smoking status: Former Smoker    Packs/day: 0.00    Years: 1.00    Types: Cigarettes  . Smokeless tobacco: Never Used     Comment: quit Jan 2014  . Alcohol use No     Allergies   Butalbital-apap-caffeine; Ferrlecit [na ferric gluc cplx in sucrose]; Minoxidil; and Darvocet [propoxyphene n-acetaminophen]   Review of Systems Review of Systems 10 Systems reviewed and are negative for acute change except as noted in the HPI.  Physical Exam Updated Vital Signs BP 160/90   Pulse 69   Temp 98.3 F (36.8 C) (Oral)   Resp 18   SpO2 100%   Physical Exam  Constitutional: He is oriented to person, place, and time.  Patient is anxious and somewhat brisk in his movements. He is alert and nontoxic. No respiratory distress.  HENT:  Head: Normocephalic and atraumatic.  Mouth/Throat: Oropharynx is clear and moist.  Eyes: EOM are normal. Pupils are equal, round, and reactive to light.  Neck: Neck supple.  Cardiovascular: Normal rate, regular rhythm, normal heart sounds and intact distal pulses.   Pulmonary/Chest: Effort normal and breath sounds normal.  Abdominal: Soft. Bowel sounds are normal. He exhibits no distension. There is tenderness.  Patient endorses generalized  tenderness of his abdomen to palpation. There is no localizing, no palpable masses no guarding.  Musculoskeletal: Normal range of motion. He exhibits no edema.  Neurological: He is alert and oriented to person, place, and time. No cranial nerve deficit. He exhibits normal muscle tone. Coordination normal.  Skin: Skin is warm and dry.  Skin has diffuse thickening and dryness. 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     ED Treatments / Results  Labs (all labs ordered are listed, but only abnormal results are displayed) Labs Reviewed  COMPREHENSIVE METABOLIC PANEL - Abnormal; Notable for the following:       Result Value   Potassium 5.3 (*)    Glucose, Bld 103 (*)    BUN 33 (*)    Creatinine, Ser 8.64 (*)    Calcium 8.5 (*)    Albumin 2.9 (*)    ALT 9 (*)    Alkaline Phosphatase 130 (*)    GFR calc non Af Amer 6 (*)    GFR calc Af Amer 7 (*)    All other components within normal limits  CBC - Abnormal; Notable for the following:    RBC 2.70 (*)    Hemoglobin 7.2 (*)    HCT 23.6 (*)    RDW 16.0 (*)    All other components within normal limits  LIPASE, BLOOD    EKG  EKG Interpretation  Date/Time:  Tuesday February 13 2016 20:19:05 EDT Ventricular Rate:  71 PR Interval:    QRS Duration: 92 QT Interval:  459 QTC Calculation: 499 R Axis:   -14 Text Interpretation:  Sinus rhythm LVH with secondary repolarization abnormality Borderline prolonged QT interval Baseline wander in lead(s) V2 Confirmed by Johnney Killian, MD, Jeannie Done [phone]) on 02/13/2016 8:34:29 PM       Radiology No results found.  Procedures Procedures (including critical care time)  Medications Ordered in ED Medications  carvedilol (COREG) tablet 25 mg (not administered)  gabapentin (NEURONTIN) capsule 100 mg (not administered)  ondansetron (ZOFRAN) injection 4 mg (4 mg Intravenous Given 02/13/16 2212)  famotidine (PEPCID) IVPB 20 mg  premix (  20 mg Intravenous New Bag/Given 02/13/16 2212)  ALPRAZolam [name]) tablet 0.5 mg (0.5 mg Oral Given 02/13/16 2212)     Initial Impression / Assessment and Plan / ED Course  I have reviewed the triage vital signs and the nursing notes.  Pertinent labs & imaging results that were available during my care of the patient were reviewed by me and considered in my medical decision making (see chart for details).  Clinical Course   Recheck (23:15) patient is sleeping soundly. No signs of distress. Vital signs are stable.  Final Clinical Impressions(s) / ED Diagnoses   Final diagnoses:  Weakness  Dyspnea  Generalized abdominal pain  Patient general parents upon arrival suggested some anxiety. He did describe his throat tightening and feeling short of breath although on exam his throat was clear and no evidence of stridor or difficulty breathing. He seemed anxious and abrupt in his movements. Diagnostic workup is not significantly changed from chronic findings. Vital signs have remained stable. Patient predominantly had abdominal discomfort and nausea with the appearance of anxiety. He was given Pepcid and Zofran and an oral dose of Xanax. Patient subsequently shows no distress. He has been able to sleep quietly without any evidence of difficulty breathing and stable vital signs. At this time I do feel he is stable for discharge and continued outpatient management.  New Prescriptions New Prescriptions   FAMOTIDINE (PEPCID) 20 MG TABLET    Take 1 tablet (20 mg total) by mouth 2 (two) times daily.   ONDANSETRON (ZOFRAN ODT) 4 MG DISINTEGRATING TABLET    16m ODT q4 hours prn nausea/vomit     MCharlesetta Shanks MD 02/13/16 [phone]

## 2016-02-13 NOTE — ED Notes (Signed)
This RN provided pt with apple juice, crackers, peanut butter while walking out of ED. Pt demanded a taxi voucher. Spoke with Shanon Brow RN who is charge, stating no taxi voucher would be provided. Pt became verbally aggressive with this RN stating "you're going to make me be mean to you, you're going to make me cuss you out and I know you dont want that to happen" pt was escorted out to the lobby.

## 2016-02-13 NOTE — ED Notes (Signed)
Pt's IV occluded, restarted in the left [facility] per pt request.

## 2016-02-13 NOTE — ED Notes (Signed)
Patient Alert and oriented X4. Stable and ambulatory. Patient verbalized understanding of the discharge instructions.  Patient belongings were taken by the patient.  

## 2016-02-15 DIAGNOSIS — Z418 Encounter for other procedures for purposes other than remedying health state: Secondary | ICD-10-CM | POA: Diagnosis not present

## 2016-02-15 DIAGNOSIS — Z7901 Long term (current) use of anticoagulants: Secondary | ICD-10-CM | POA: Diagnosis not present

## 2016-02-15 DIAGNOSIS — D631 Anemia in chronic kidney disease: Secondary | ICD-10-CM | POA: Diagnosis not present

## 2016-02-15 DIAGNOSIS — N2581 Secondary hyperparathyroidism of renal origin: Secondary | ICD-10-CM | POA: Diagnosis not present

## 2016-02-15 DIAGNOSIS — E8779 Other fluid overload: Secondary | ICD-10-CM | POA: Diagnosis not present

## 2016-02-15 DIAGNOSIS — I10 Essential (primary) hypertension: Secondary | ICD-10-CM | POA: Diagnosis not present

## 2016-02-15 DIAGNOSIS — N186 End stage renal disease: Secondary | ICD-10-CM | POA: Diagnosis not present

## 2016-02-16 DIAGNOSIS — I10 Essential (primary) hypertension: Secondary | ICD-10-CM | POA: Diagnosis not present

## 2016-02-16 DIAGNOSIS — D631 Anemia in chronic kidney disease: Secondary | ICD-10-CM | POA: Diagnosis not present

## 2016-02-16 DIAGNOSIS — Z418 Encounter for other procedures for purposes other than remedying health state: Secondary | ICD-10-CM | POA: Diagnosis not present

## 2016-02-16 DIAGNOSIS — N2581 Secondary hyperparathyroidism of renal origin: Secondary | ICD-10-CM | POA: Diagnosis not present

## 2016-02-16 DIAGNOSIS — N186 End stage renal disease: Secondary | ICD-10-CM | POA: Diagnosis not present

## 2016-02-16 DIAGNOSIS — E8779 Other fluid overload: Secondary | ICD-10-CM | POA: Diagnosis not present

## 2016-02-19 DIAGNOSIS — I10 Essential (primary) hypertension: Secondary | ICD-10-CM | POA: Diagnosis not present

## 2016-02-19 DIAGNOSIS — R109 Unspecified abdominal pain: Secondary | ICD-10-CM | POA: Diagnosis not present

## 2016-02-19 DIAGNOSIS — N186 End stage renal disease: Secondary | ICD-10-CM | POA: Diagnosis not present

## 2016-02-19 DIAGNOSIS — Z418 Encounter for other procedures for purposes other than remedying health state: Secondary | ICD-10-CM | POA: Diagnosis not present

## 2016-02-19 DIAGNOSIS — E8779 Other fluid overload: Secondary | ICD-10-CM | POA: Diagnosis not present

## 2016-02-19 DIAGNOSIS — Z7901 Long term (current) use of anticoagulants: Secondary | ICD-10-CM | POA: Diagnosis not present

## 2016-02-19 DIAGNOSIS — G8929 Other chronic pain: Secondary | ICD-10-CM | POA: Diagnosis not present

## 2016-02-19 DIAGNOSIS — N2889 Other specified disorders of kidney and ureter: Secondary | ICD-10-CM | POA: Diagnosis not present

## 2016-02-19 DIAGNOSIS — Z992 Dependence on renal dialysis: Secondary | ICD-10-CM | POA: Diagnosis not present

## 2016-02-19 DIAGNOSIS — S40871A Other superficial bite of right upper arm, initial encounter: Secondary | ICD-10-CM | POA: Diagnosis not present

## 2016-02-19 DIAGNOSIS — D631 Anemia in chronic kidney disease: Secondary | ICD-10-CM | POA: Diagnosis not present

## 2016-02-19 DIAGNOSIS — N2581 Secondary hyperparathyroidism of renal origin: Secondary | ICD-10-CM | POA: Diagnosis not present

## 2016-02-22 DIAGNOSIS — Z7901 Long term (current) use of anticoagulants: Secondary | ICD-10-CM | POA: Diagnosis not present

## 2016-02-22 DIAGNOSIS — Z418 Encounter for other procedures for purposes other than remedying health state: Secondary | ICD-10-CM | POA: Diagnosis not present

## 2016-02-22 DIAGNOSIS — N186 End stage renal disease: Secondary | ICD-10-CM | POA: Diagnosis not present

## 2016-02-22 DIAGNOSIS — E8779 Other fluid overload: Secondary | ICD-10-CM | POA: Diagnosis not present

## 2016-02-22 DIAGNOSIS — E119 Type 2 diabetes mellitus without complications: Secondary | ICD-10-CM | POA: Diagnosis not present

## 2016-02-22 DIAGNOSIS — N2581 Secondary hyperparathyroidism of renal origin: Secondary | ICD-10-CM | POA: Diagnosis not present

## 2016-02-23 DIAGNOSIS — N2581 Secondary hyperparathyroidism of renal origin: Secondary | ICD-10-CM | POA: Diagnosis not present

## 2016-02-23 DIAGNOSIS — N186 End stage renal disease: Secondary | ICD-10-CM | POA: Diagnosis not present

## 2016-02-23 DIAGNOSIS — E8779 Other fluid overload: Secondary | ICD-10-CM | POA: Diagnosis not present

## 2016-02-23 DIAGNOSIS — Z418 Encounter for other procedures for purposes other than remedying health state: Secondary | ICD-10-CM | POA: Diagnosis not present

## 2016-02-24 DIAGNOSIS — N186 End stage renal disease: Secondary | ICD-10-CM | POA: Diagnosis not present

## 2016-02-24 DIAGNOSIS — E8779 Other fluid overload: Secondary | ICD-10-CM | POA: Diagnosis not present

## 2016-02-24 DIAGNOSIS — N2581 Secondary hyperparathyroidism of renal origin: Secondary | ICD-10-CM | POA: Diagnosis not present

## 2016-02-24 DIAGNOSIS — Z418 Encounter for other procedures for purposes other than remedying health state: Secondary | ICD-10-CM | POA: Diagnosis not present

## 2016-02-26 DIAGNOSIS — N186 End stage renal disease: Secondary | ICD-10-CM | POA: Diagnosis not present

## 2016-02-26 DIAGNOSIS — N2581 Secondary hyperparathyroidism of renal origin: Secondary | ICD-10-CM | POA: Diagnosis not present

## 2016-02-26 DIAGNOSIS — E8779 Other fluid overload: Secondary | ICD-10-CM | POA: Diagnosis not present

## 2016-02-26 DIAGNOSIS — Z418 Encounter for other procedures for purposes other than remedying health state: Secondary | ICD-10-CM | POA: Diagnosis not present

## 2016-02-27 DIAGNOSIS — N186 End stage renal disease: Secondary | ICD-10-CM | POA: Diagnosis not present

## 2016-02-27 DIAGNOSIS — Z7901 Long term (current) use of anticoagulants: Secondary | ICD-10-CM | POA: Diagnosis not present

## 2016-02-27 DIAGNOSIS — D631 Anemia in chronic kidney disease: Secondary | ICD-10-CM | POA: Diagnosis not present

## 2016-02-27 DIAGNOSIS — Z418 Encounter for other procedures for purposes other than remedying health state: Secondary | ICD-10-CM | POA: Diagnosis not present

## 2016-02-27 DIAGNOSIS — N2581 Secondary hyperparathyroidism of renal origin: Secondary | ICD-10-CM | POA: Diagnosis not present

## 2016-02-27 DIAGNOSIS — N189 Chronic kidney disease, unspecified: Secondary | ICD-10-CM | POA: Diagnosis not present

## 2016-02-27 DIAGNOSIS — E8779 Other fluid overload: Secondary | ICD-10-CM | POA: Diagnosis not present

## 2016-02-28 DIAGNOSIS — N189 Chronic kidney disease, unspecified: Secondary | ICD-10-CM | POA: Diagnosis not present

## 2016-02-28 DIAGNOSIS — Z418 Encounter for other procedures for purposes other than remedying health state: Secondary | ICD-10-CM | POA: Diagnosis not present

## 2016-02-28 DIAGNOSIS — N2581 Secondary hyperparathyroidism of renal origin: Secondary | ICD-10-CM | POA: Diagnosis not present

## 2016-02-28 DIAGNOSIS — E8779 Other fluid overload: Secondary | ICD-10-CM | POA: Diagnosis not present

## 2016-02-28 DIAGNOSIS — D631 Anemia in chronic kidney disease: Secondary | ICD-10-CM | POA: Diagnosis not present

## 2016-02-28 DIAGNOSIS — N186 End stage renal disease: Secondary | ICD-10-CM | POA: Diagnosis not present

## 2016-03-02 DIAGNOSIS — Z418 Encounter for other procedures for purposes other than remedying health state: Secondary | ICD-10-CM | POA: Diagnosis not present

## 2016-03-02 DIAGNOSIS — E8779 Other fluid overload: Secondary | ICD-10-CM | POA: Diagnosis not present

## 2016-03-02 DIAGNOSIS — N2581 Secondary hyperparathyroidism of renal origin: Secondary | ICD-10-CM | POA: Diagnosis not present

## 2016-03-02 DIAGNOSIS — N186 End stage renal disease: Secondary | ICD-10-CM | POA: Diagnosis not present

## 2016-03-04 DIAGNOSIS — N2889 Other specified disorders of kidney and ureter: Secondary | ICD-10-CM | POA: Diagnosis not present

## 2016-03-04 DIAGNOSIS — R1030 Lower abdominal pain, unspecified: Secondary | ICD-10-CM | POA: Diagnosis not present

## 2016-03-04 DIAGNOSIS — G8929 Other chronic pain: Secondary | ICD-10-CM | POA: Diagnosis not present

## 2016-03-04 DIAGNOSIS — Z992 Dependence on renal dialysis: Secondary | ICD-10-CM | POA: Diagnosis not present

## 2016-03-04 DIAGNOSIS — N189 Chronic kidney disease, unspecified: Secondary | ICD-10-CM | POA: Diagnosis not present

## 2016-03-05 ENCOUNTER — Emergency Department (HOSPITAL_COMMUNITY): Payer: Medicare Other

## 2016-03-05 ENCOUNTER — Encounter (HOSPITAL_COMMUNITY): Payer: Self-pay | Admitting: Emergency Medicine

## 2016-03-05 ENCOUNTER — Emergency Department (HOSPITAL_COMMUNITY)
Admission: EM | Admit: 2016-03-05 | Discharge: 2016-03-05 | Disposition: A | Discharge status: Home / Self Care | Payer: Medicare Other | Attending: Emergency Medicine | Admitting: Emergency Medicine

## 2016-03-05 DIAGNOSIS — R109 Unspecified abdominal pain: Secondary | ICD-10-CM | POA: Diagnosis not present

## 2016-03-05 DIAGNOSIS — E1122 Type 2 diabetes mellitus with diabetic chronic kidney disease: Secondary | ICD-10-CM | POA: Diagnosis not present

## 2016-03-05 DIAGNOSIS — I160 Hypertensive urgency: Secondary | ICD-10-CM | POA: Diagnosis not present

## 2016-03-05 DIAGNOSIS — Z992 Dependence on renal dialysis: Secondary | ICD-10-CM | POA: Insufficient documentation

## 2016-03-05 DIAGNOSIS — Z7901 Long term (current) use of anticoagulants: Secondary | ICD-10-CM | POA: Insufficient documentation

## 2016-03-05 DIAGNOSIS — G8929 Other chronic pain: Secondary | ICD-10-CM | POA: Diagnosis not present

## 2016-03-05 DIAGNOSIS — I132 Hypertensive heart and chronic kidney disease with heart failure and with stage 5 chronic kidney disease, or end stage renal disease: Secondary | ICD-10-CM | POA: Insufficient documentation

## 2016-03-05 DIAGNOSIS — R0602 Shortness of breath: Secondary | ICD-10-CM | POA: Diagnosis not present

## 2016-03-05 DIAGNOSIS — Z87891 Personal history of nicotine dependence: Secondary | ICD-10-CM | POA: Diagnosis not present

## 2016-03-05 DIAGNOSIS — N186 End stage renal disease: Secondary | ICD-10-CM | POA: Insufficient documentation

## 2016-03-05 DIAGNOSIS — I5042 Chronic combined systolic (congestive) and diastolic (congestive) heart failure: Secondary | ICD-10-CM | POA: Insufficient documentation

## 2016-03-05 LAB — CBC WITH DIFFERENTIAL/PLATELET
Basophils Absolute: 0 10*3/uL (ref 0.0–0.1)
Basophils Relative: 0 %
EOS PCT: 8 %
Eosinophils Absolute: 0.5 10*3/uL (ref 0.0–0.7)
HEMATOCRIT: 28.2 % — AB (ref 39.0–52.0)
Hemoglobin: 8.7 g/dL — ABNORMAL LOW (ref 13.0–17.0)
LYMPHS ABS: 1.1 10*3/uL (ref 0.7–4.0)
LYMPHS PCT: 19 %
MCH: 26.8 pg (ref 26.0–34.0)
MCHC: 30.9 g/dL (ref 30.0–36.0)
MCV: 86.8 fL (ref 78.0–100.0)
MONO ABS: 0.6 10*3/uL (ref 0.1–1.0)
Monocytes Relative: 11 %
Neutro Abs: 3.6 10*3/uL (ref 1.7–7.7)
Neutrophils Relative %: 62 %
PLATELETS: 201 10*3/uL (ref 150–400)
RBC: 3.25 MIL/uL — AB (ref 4.22–5.81)
RDW: 15.9 % — AB (ref 11.5–15.5)
WBC: 5.8 10*3/uL (ref 4.0–10.5)

## 2016-03-05 LAB — BASIC METABOLIC PANEL
Anion gap: 14 (ref 5–15)
BUN: 46 mg/dL — AB (ref 6–20)
CO2: 22 mmol/L (ref 22–32)
Calcium: 9.1 mg/dL (ref 8.9–10.3)
Chloride: 101 mmol/L (ref 101–111)
Creatinine, Ser: 14.11 mg/dL — ABNORMAL HIGH (ref 0.61–1.24)
GFR calc Af Amer: 4 mL/min — ABNORMAL LOW (ref >60)
GFR, EST NON AFRICAN AMERICAN: 3 mL/min — AB (ref >60)
GLUCOSE: 91 mg/dL (ref 65–99)
POTASSIUM: 4.7 mmol/L (ref 3.5–5.1)
Sodium: 137 mmol/L (ref 135–145)

## 2016-03-05 MED ORDER — CLONIDINE HCL 0.2 MG PO TABS
0.2000 mg | ORAL_TABLET | Freq: Once | ORAL | Status: AC
  Administered 2016-03-05: 0.2 mg via ORAL
  Filled 2016-03-05: qty 1

## 2016-03-05 MED ORDER — LABETALOL HCL 5 MG/ML IV SOLN: 10.0000 mg | Freq: Once | INTRAVENOUS | Status: DC

## 2016-03-05 MED ORDER — OXYCODONE-ACETAMINOPHEN 5-325 MG PO TABS
1.0000 | ORAL_TABLET | Freq: Once | ORAL | Status: AC
  Administered 2016-03-05: 1 via ORAL
  Filled 2016-03-05: qty 1

## 2016-03-05 MED ORDER — HYDRALAZINE HCL 50 MG PO TABS
100.0000 mg | ORAL_TABLET | Freq: Once | ORAL | Status: DC
  Filled 2016-03-05: qty 2

## 2016-03-05 NOTE — ED Triage Notes (Signed)
Sent here from dialysis due to high blood pressure-- unable to be dialyzed today-- missed BP meds yesterday and today-- pt moaning with pain in stomach and back.

## 2016-03-05 NOTE — ED Provider Notes (Signed)
[facility] DEPT Provider Note   CSN: [id number] Arrival date & time: 03/05/16  1320     History   Chief Complaint Chief Complaint  Patient presents with  . Hypertension    HPI Frank [name] is a 50 y.o. male who presents with complaints of HTN. PMH significant for ESRD currently on dialysis (dialysis T, Th, Sat. Last dialyzed on Sat), HTN, DM type 2, combined systolic and diastolic CHF EF 00-93%, hx of renal carcinoma, drug seeking behavior, chronic back pain, anxiety. He states he tried to go to dialysis today but was turned away due to his BP being too high and not taking his meds before dialysis. He reports he did take his home BP meds before he came to the ED today however when asked what they were he states "don't ask me that". He states he usually takes Percocet for his pain. On review of [facility] and EMR his last fill of Percocet was 7/21 for 14 days. He also received a prescription for 14 day supply of Percocet yesterday by Urology however this has not been filled yet. He is reporting some SOB, back pain, and abdominal pain. He has chronic back and abdominal pain. He is scheduled for a radical nephrectomy by Dr. Felicie Morn on 03/19/16 and also has an appt with Nephrology tomorrow.  HPI  Past Medical History:  Diagnosis Date  . Anemia   . Anxiety   . Chronic combined systolic and diastolic CHF (congestive heart failure) (HCC)    a. EF 20-25% by echo in 08/2015 b. echo 10/2015: EF 35-40%, diffuse HK, severe LAE, moderate RAE, small pericardial effusion  . Complication of anesthesia    itching, sore throat  . Depression   . Dialysis patient ([place])   . ESRD (end stage renal disease) ([place])    due to HTN per patient, followed at [facility], s/p failed kidney transplant - dialysis Tue, Th, Sat  . Hyperkalemia 12/2015  . Hypertension   . Junctional rhythm    a. noted in 08/2015: hyperkalemic at that time  b. 12/2015: presented in junctional rhythm w/ K+ of 6.6. Resolved with improvement  of K+ levels.  . Nonischemic cardiomyopathy ([place])    a. 08/2014: cath showing minimal CAD, but tortuous arteries noted.   . Renal insufficiency   . Shortness of breath   . Type II diabetes mellitus (HCC)    No history per patient, but remains under history as A1c would not be accurate given on dialysis    Patient Active Problem List   Diagnosis Date Noted  . Nonischemic cardiomyopathy ([place]) 01/09/2016  . Chronic abdominal pain   . ESRD (end stage renal disease) on dialysis ([place])   . SOB (shortness of breath)   . Generalized abdominal pain   . Faintness   . Dyspnea 01/07/2016  . Hyperkalemia 12/16/2015  . Bilateral low back pain without sciatica   . Pericardial effusion 10/30/2015  . Left renal mass 10/30/2015  . Constipation 10/30/2015  . Hypertensive urgency 09/08/2015  . Adjustment disorder with mixed anxiety and depressed mood 08/20/2015  . Chronic epididymitis 06/19/2015  . Groin pain, chronic, right 06/19/2015  . Incarcerated right inguinal hernia 02/16/2015  . Malnutrition of moderate degree ([place]) 02/03/2015  . Left upper extremity deep vein thrombosis ([place]) 02/01/2015  . Drug-seeking behavior 02/01/2015  . Anemia of chronic disease   . Dyslipidemia   . Chronic pulmonary embolism ([place])   . History of noncompliance with medical treatment   . Chronic combined systolic  and diastolic CHF (congestive heart failure) ([place])   . ESRD on dialysis- Tues, Thurs, Sat in [place], nephro: [name] 04/13/2013    Past Surgical History:  Procedure Laterality Date  . CAPD INSERTION    . CAPD REMOVAL    . INGUINAL HERNIA REPAIR Right 02/14/2015   Procedure: REPAIR INCARCERATED RIGHT INGUINAL HERNIA;  Surgeon: [name], MD;  Location: [facility];  Service: General;  Laterality: Right;  . INSERTION OF DIALYSIS CATHETER Right 09/23/2015   Procedure: exchange of Right internal Dialysis Catheter.;  Surgeon: [name], MD;  Location: [facility];  Service: Vascular;  Laterality: Right;  .  KIDNEY RECEIPIENT  2006   failed and started HD in March 2014  . LEFT HEART CATHETERIZATION WITH CORONARY ANGIOGRAM N/A 09/02/2014   Procedure: LEFT HEART CATHETERIZATION WITH CORONARY ANGIOGRAM;  Surgeon: [name], MD;  Location: [facility] C[facility] LAB;  Service: Cardiovascular;  Laterality: N/A;       Home Medications    Prior to Admission medications   Medication Sig Start Date End Date Taking? Authorizing Provider  allopurinol (ZYLOPRIM) 100 MG tablet Take 100 mg by mouth daily.    Historical Provider, MD  amLODipine (NORVASC) 10 MG tablet Take 10 mg by mouth daily.    Historical Provider, MD  atorvastatin (LIPITOR) 40 MG tablet Take 1 tablet (40 mg total) by mouth daily at 6 PM. 09/02/14   Barton Dubois, MD  AURYXIA 1 GM 210 MG(Fe) TABS Take 420 mg by mouth 3 (three) times daily. 01/26/16   Historical Provider, MD  carvedilol (COREG) 12.5 MG tablet Take 1 tablet (12.5 mg total) by mouth 2 (two) times daily with a meal. Patient not taking: Reported on 01/08/2016 11/04/15   [name], MD  carvedilol (COREG) 25 MG tablet Take 25 mg by mouth 2 (two) times daily with a meal.    Historical Provider, MD  cinacalcet (SENSIPAR) 30 MG tablet Take 30 mg by mouth daily.    Historical Provider, MD  cloNIDine (CATAPRES - DOSED IN MG/24 HR) 0.3 mg/24hr patch Place 0.3 mg onto the skin once a week.    Historical Provider, MD  doxazosin (CARDURA) 2 MG tablet Take 2 mg by mouth daily.    Historical Provider, MD  famotidine (PEPCID) 20 MG tablet Take 1 tablet (20 mg total) by mouth 2 (two) times daily. 02/13/16   Charlesetta Shanks, MD  gabapentin (NEURONTIN) 100 MG capsule Take 100 mg by mouth 3 (three) times daily.    Historical Provider, MD  hydrALAZINE (APRESOLINE) 100 MG tablet Take 100 mg by mouth every 8 (eight) hours.    Historical Provider, MD  isosorbide mononitrate (IMDUR) 60 MG 24 hr tablet Take 1 tablet (60 mg total) by mouth daily. 01/09/16   Smiley Houseman, MD  omeprazole (PRILOSEC) 20 MG  capsule Take 20 mg by mouth daily. 07/01/13   Historical Provider, MD  ondansetron (ZOFRAN ODT) 4 MG disintegrating tablet 2m ODT q4 hours prn nausea/vomit 02/13/16   MCharlesetta Shanks MD  oxyCODONE-acetaminophen (PERCOCET/ROXICET) 5-325 MG tablet Take 1-2 tablets by mouth every 6 (six) hours as needed for severe pain. 11/13/15   JCarlisle Cater PA-C  predniSONE (DELTASONE) 10 MG tablet Take 10 mg by mouth daily with breakfast.    Historical Provider, MD  promethazine (PHENERGAN) 25 MG tablet Take 1 tablet (25 mg total) by mouth every 8 (eight) hours as needed for nausea or vomiting. 02/11/16   CDalia Heading PA-C  sevelamer carbonate (RENVELA) 800 MG  tablet Take 800-1,600 mg by mouth 3 (three) times daily with meals. 2 tabs three times daily with meals, and 1 tablet with snacks    Historical Provider, MD  warfarin (COUMADIN) 2 MG tablet Take 2 mg by mouth daily.  01/31/16   Historical Provider, MD  warfarin (COUMADIN) 5 MG tablet  01/31/16   Historical Provider, MD    Family History Family History  Problem Relation Age of Onset  . Hypertension Other     Social History Social History  Substance Use Topics  . Smoking status: Former Smoker    Packs/day: 0.00    Years: 1.00    Types: Cigarettes  . Smokeless tobacco: Never Used     Comment: quit Jan 2014  . Alcohol use No     Allergies   Butalbital-apap-caffeine; Ferrlecit [na ferric gluc cplx in sucrose]; Minoxidil; and Darvocet [propoxyphene n-acetaminophen]   Review of Systems Review of Systems  Constitutional: Negative for chills and fever.  Respiratory: Positive for shortness of breath. Negative for cough.   Cardiovascular: Negative for chest pain.  Gastrointestinal: Positive for abdominal pain. Negative for nausea and vomiting.  Musculoskeletal: Positive for back pain.  Neurological: Negative for headaches.  Psychiatric/Behavioral: Negative for confusion.  All other systems reviewed and are negative.    Physical  Exam Updated Vital Signs BP (!) 195/145   Pulse 100   Temp 98.4 F (36.9 C) (Oral)   Resp 16   Ht _0  (1.88 m)   Wt 74.8 kg   SpO2 98%   BMI 21.18 kg/m   Physical Exam  Constitutional: He is oriented to person, place, and time. He appears well-developed and well-nourished. No distress.  Somewhat uncooperative with exam  HENT:  Head: Normocephalic and atraumatic.  Eyes: Conjunctivae are normal. Pupils are equal, round, and reactive to light. Right eye exhibits no discharge. Left eye exhibits no discharge. No scleral icterus.  Neck: Normal range of motion. Neck supple.  Cardiovascular: Normal rate and regular rhythm.  Exam reveals no gallop and no friction rub.   No murmur heard. Pulmonary/Chest: Effort normal and breath sounds normal. No respiratory distress. He has no wheezes. He has no rales. He exhibits no tenderness.  Abdominal: Soft. Bowel sounds are normal. He exhibits no distension and no mass. There is tenderness. There is guarding. There is no rebound. No hernia.  Multiple surgical scars; diffuse tenderness  Musculoskeletal: He exhibits no edema.  No midline spinal tenderness. Right sided lower muscle tenderness  Neurological: He is alert and oriented to person, place, and time.  Skin: Skin is warm and dry.  Port-a-cath site is clean and dry  Psychiatric: His mood appears anxious. He is agitated (Easily irritated). He expresses impulsivity.  Nursing note and vitals reviewed.    ED Treatments / Results  Labs (all labs ordered are listed, but only abnormal results are displayed) Labs Reviewed  CBC WITH DIFFERENTIAL/PLATELET - Abnormal; Notable for the following:       Result Value   RBC 3.25 (*)    Hemoglobin 8.7 (*)    HCT 28.2 (*)    RDW 15.9 (*)    All other components within normal limits  BASIC METABOLIC PANEL - Abnormal; Notable for the following:    BUN 46 (*)    Creatinine, Ser 14.11 (*)    GFR calc non Af Amer 3 (*)    GFR calc Af Amer 4 (*)    All  other components within normal limits    EKG  EKG Interpretation None       Radiology Dg Chest 2 View  Result Date: 03/05/2016 CLINICAL DATA:  Short of breath.  Dialysis patient EXAM: CHEST  2 VIEW COMPARISON:  02/11/2016 FINDINGS: Interval improvement in pulmonary edema since the prior study. Mild vascular congestion. Small pleural effusions. Dialysis catheter unchanged in position. IMPRESSION: Improving pulmonary edema. Electronically Signed   By: Franchot Gallo M.D.   On: 03/05/2016 16:39    Procedures Procedures (including critical care time)  Medications Ordered in ED Medications  oxyCODONE-acetaminophen (PERCOCET/ROXICET) 5-325 MG per tablet 1 tablet (1 tablet Oral Given 03/05/16 1446)  cloNIDine (CATAPRES) tablet 0.2 mg (0.2 mg Oral Given 03/05/16 1446)  cloNIDine (CATAPRES) tablet 0.2 mg (0.2 mg Oral Given 03/05/16 1649)     Initial Impression / Assessment and Plan / ED Course  I have reviewed the triage vital signs and the nursing notes.  Pertinent labs & imaging results that were available during my care of the patient were reviewed by me and considered in my medical decision making (see chart for details).  Clinical Course   50 year old male presents with complaints of HTN and SOB after being denied dialysis today. He has questionable compliance with medicines. BP is markedly elevated in the 190s/140s. Clonidine 0.2 mg given. CBC remarkable for chronic anemia which is at baseline. BMP remarkable for normal K but elevated BUN/SCr when compared to three weeks ago. CXR shows mild vascular congestion and small pleural effusions which is improved since the last study.   On recheck of BP it is trending down to 166/116. 2nd dose of Clonidine 0.2 mg given. Will d/c with instructions to take home Hydralazine this evening and to call tomorrow to have dialysis in the morning. No need for urgent dialysis at this time. Informed patient of all results. He is unhappy with being  discharged and wants dialysis at the hospital. Discussed with patient reasons for emergent dialysis and why he did not fit criteria at this time. He is still unhappy with this and is arguing. Urged him to follow up for dialysis tomorrow but patient refused saying he has appointment tomorrow. He states he is going to go to [place] because they have different criteria for dialysis. Strict return precautions given.   Final Clinical Impressions(s) / ED Diagnoses   Final diagnoses:  Hypertensive urgency  SOB (shortness of breath)  Chronic abdominal pain    New Prescriptions New Prescriptions   No medications on file     Recardo Evangelist, PA-C 03/05/16 1947    Elnora Morrison, MD 03/06/16 1213

## 2016-03-05 NOTE — ED Notes (Signed)
Pt is in stable condition upon d/c and ambulates from ED. Pt is upset about d/c.

## 2016-03-06 ENCOUNTER — Encounter (HOSPITAL_COMMUNITY): Payer: Self-pay | Admitting: Emergency Medicine

## 2016-03-06 DIAGNOSIS — I5042 Chronic combined systolic (congestive) and diastolic (congestive) heart failure: Secondary | ICD-10-CM | POA: Diagnosis not present

## 2016-03-06 DIAGNOSIS — N186 End stage renal disease: Secondary | ICD-10-CM | POA: Diagnosis not present

## 2016-03-06 DIAGNOSIS — R0602 Shortness of breath: Secondary | ICD-10-CM | POA: Diagnosis not present

## 2016-03-06 DIAGNOSIS — Z5321 Procedure and treatment not carried out due to patient leaving prior to being seen by health care provider: Secondary | ICD-10-CM | POA: Insufficient documentation

## 2016-03-06 DIAGNOSIS — Z79899 Other long term (current) drug therapy: Secondary | ICD-10-CM | POA: Insufficient documentation

## 2016-03-06 DIAGNOSIS — Z992 Dependence on renal dialysis: Secondary | ICD-10-CM | POA: Diagnosis not present

## 2016-03-06 DIAGNOSIS — Z7901 Long term (current) use of anticoagulants: Secondary | ICD-10-CM | POA: Insufficient documentation

## 2016-03-06 DIAGNOSIS — I132 Hypertensive heart and chronic kidney disease with heart failure and with stage 5 chronic kidney disease, or end stage renal disease: Secondary | ICD-10-CM | POA: Diagnosis not present

## 2016-03-06 DIAGNOSIS — Z87891 Personal history of nicotine dependence: Secondary | ICD-10-CM | POA: Insufficient documentation

## 2016-03-06 DIAGNOSIS — E1122 Type 2 diabetes mellitus with diabetic chronic kidney disease: Secondary | ICD-10-CM | POA: Diagnosis not present

## 2016-03-06 DIAGNOSIS — R1110 Vomiting, unspecified: Secondary | ICD-10-CM | POA: Diagnosis not present

## 2016-03-06 LAB — CBC WITH DIFFERENTIAL/PLATELET
BASOS ABS: 0 10*3/uL (ref 0.0–0.1)
Basophils Relative: 0 %
EOS PCT: 9 %
Eosinophils Absolute: 0.4 10*3/uL (ref 0.0–0.7)
HEMATOCRIT: 25.3 % — AB (ref 39.0–52.0)
Hemoglobin: 8.2 g/dL — ABNORMAL LOW (ref 13.0–17.0)
LYMPHS ABS: 1.1 10*3/uL (ref 0.7–4.0)
LYMPHS PCT: 22 %
MCH: 28.1 pg (ref 26.0–34.0)
MCHC: 32.4 g/dL (ref 30.0–36.0)
MCV: 86.6 fL (ref 78.0–100.0)
MONO ABS: 0.4 10*3/uL (ref 0.1–1.0)
MONOS PCT: 9 %
NEUTROS ABS: 2.9 10*3/uL (ref 1.7–7.7)
Neutrophils Relative %: 60 %
PLATELETS: 198 10*3/uL (ref 150–400)
RBC: 2.92 MIL/uL — ABNORMAL LOW (ref 4.22–5.81)
RDW: 15.9 % — AB (ref 11.5–15.5)
WBC: 4.9 10*3/uL (ref 4.0–10.5)

## 2016-03-06 LAB — COMPREHENSIVE METABOLIC PANEL
ALT: 9 U/L — ABNORMAL LOW (ref 17–63)
AST: 24 U/L (ref 15–41)
Albumin: 3.1 g/dL — ABNORMAL LOW (ref 3.5–5.0)
Alkaline Phosphatase: 112 U/L (ref 38–126)
Anion gap: 12 (ref 5–15)
BILIRUBIN TOTAL: 0.7 mg/dL (ref 0.3–1.2)
BUN: 60 mg/dL — AB (ref 6–20)
CHLORIDE: 104 mmol/L (ref 101–111)
CO2: 22 mmol/L (ref 22–32)
Calcium: 9 mg/dL (ref 8.9–10.3)
Creatinine, Ser: 16.05 mg/dL — ABNORMAL HIGH (ref 0.61–1.24)
GFR, EST AFRICAN AMERICAN: 3 mL/min — AB (ref >60)
GFR, EST NON AFRICAN AMERICAN: 3 mL/min — AB (ref >60)
Glucose, Bld: 98 mg/dL (ref 65–99)
POTASSIUM: 5.5 mmol/L — AB (ref 3.5–5.1)
Sodium: 138 mmol/L (ref 135–145)
TOTAL PROTEIN: 7.6 g/dL (ref 6.5–8.1)

## 2016-03-06 MED ORDER — ONDANSETRON 4 MG PO TBDP
8.0000 mg | ORAL_TABLET | Freq: Once | ORAL | Status: AC
  Administered 2016-03-06: 8 mg via ORAL

## 2016-03-06 MED ORDER — ONDANSETRON 4 MG PO TBDP
ORAL_TABLET | ORAL | Status: AC
  Filled 2016-03-06: qty 2

## 2016-03-06 NOTE — ED Triage Notes (Signed)
Pt. requesting hemodialysis his last treatment was 4 days ago , pt. added emesis and mild SOB , denies fever or chills , pt. was seen here yesterday .

## 2016-03-07 ENCOUNTER — Emergency Department (HOSPITAL_COMMUNITY)
Admission: EM | Admit: 2016-03-07 | Discharge: 2016-03-07 | Disposition: A | Discharge status: Home / Self Care | Payer: Medicare Other | Attending: Emergency Medicine | Admitting: Emergency Medicine

## 2016-03-07 DIAGNOSIS — N186 End stage renal disease: Secondary | ICD-10-CM | POA: Diagnosis not present

## 2016-03-07 DIAGNOSIS — R0602 Shortness of breath: Secondary | ICD-10-CM | POA: Diagnosis not present

## 2016-03-07 DIAGNOSIS — I120 Hypertensive chronic kidney disease with stage 5 chronic kidney disease or end stage renal disease: Secondary | ICD-10-CM | POA: Diagnosis not present

## 2016-03-07 DIAGNOSIS — Z79899 Other long term (current) drug therapy: Secondary | ICD-10-CM | POA: Diagnosis not present

## 2016-03-07 DIAGNOSIS — R112 Nausea with vomiting, unspecified: Secondary | ICD-10-CM | POA: Diagnosis not present

## 2016-03-07 DIAGNOSIS — Z992 Dependence on renal dialysis: Secondary | ICD-10-CM | POA: Diagnosis not present

## 2016-03-07 DIAGNOSIS — E875 Hyperkalemia: Secondary | ICD-10-CM | POA: Diagnosis not present

## 2016-03-07 DIAGNOSIS — Z7901 Long term (current) use of anticoagulants: Secondary | ICD-10-CM | POA: Diagnosis not present

## 2016-03-07 DIAGNOSIS — I10 Essential (primary) hypertension: Secondary | ICD-10-CM | POA: Diagnosis not present

## 2016-03-07 DIAGNOSIS — Z888 Allergy status to other drugs, medicaments and biological substances status: Secondary | ICD-10-CM | POA: Diagnosis not present

## 2016-03-07 DIAGNOSIS — Z9115 Patient's noncompliance with renal dialysis: Secondary | ICD-10-CM | POA: Diagnosis not present

## 2016-03-07 DIAGNOSIS — Z886 Allergy status to analgesic agent status: Secondary | ICD-10-CM | POA: Diagnosis not present

## 2016-03-07 DIAGNOSIS — Z9119 Patient's noncompliance with other medical treatment and regimen: Secondary | ICD-10-CM | POA: Diagnosis not present

## 2016-03-07 DIAGNOSIS — R1084 Generalized abdominal pain: Secondary | ICD-10-CM | POA: Diagnosis not present

## 2016-03-07 DIAGNOSIS — Z452 Encounter for adjustment and management of vascular access device: Secondary | ICD-10-CM | POA: Diagnosis not present

## 2016-03-07 IMAGING — CR DG CHEST 2V
3 series · 3 of 3 positions shown · non-contrast
Comparison: 09/16/2013

CLINICAL DATA: Weakness and cough.

EXAM:
CHEST  2 VIEW

[w chest lat]
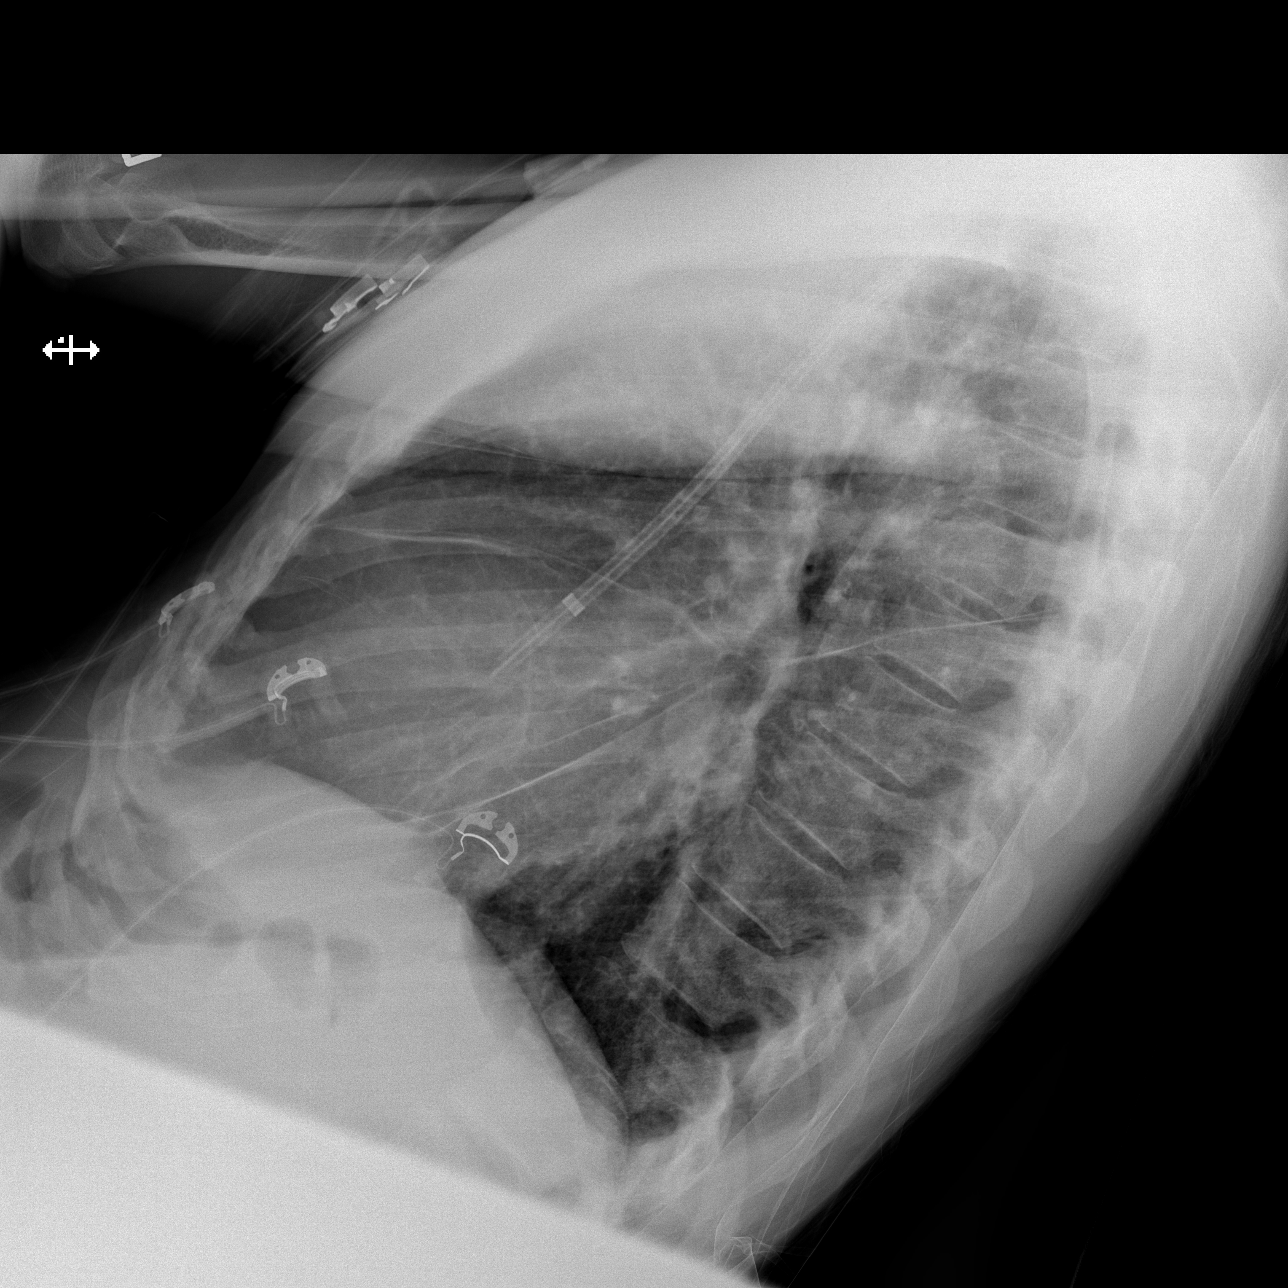

[x chest ap (1 of 2)]
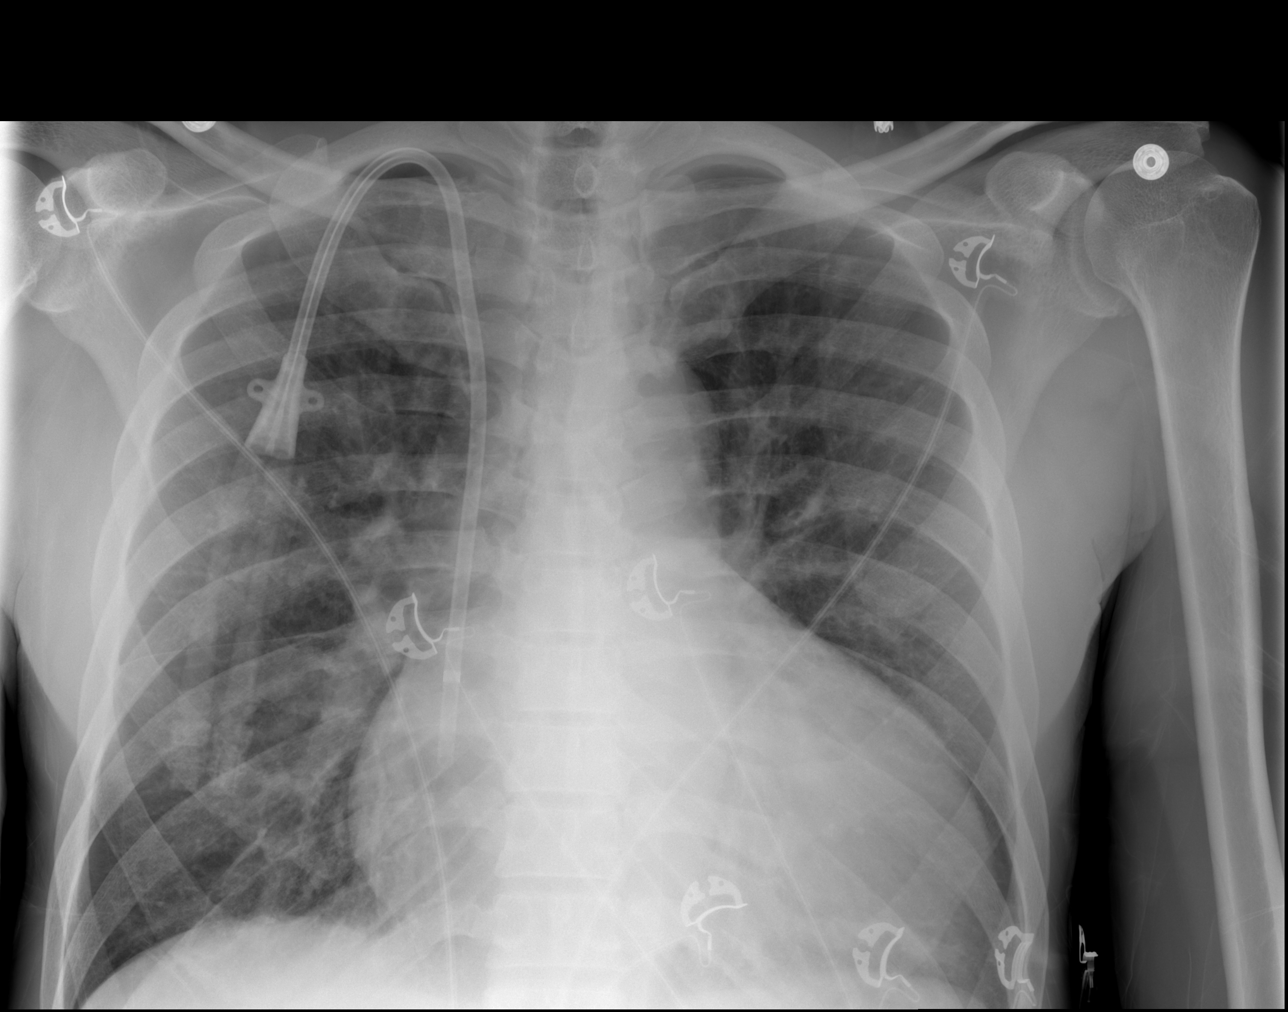

[x chest ap (2 of 2)]
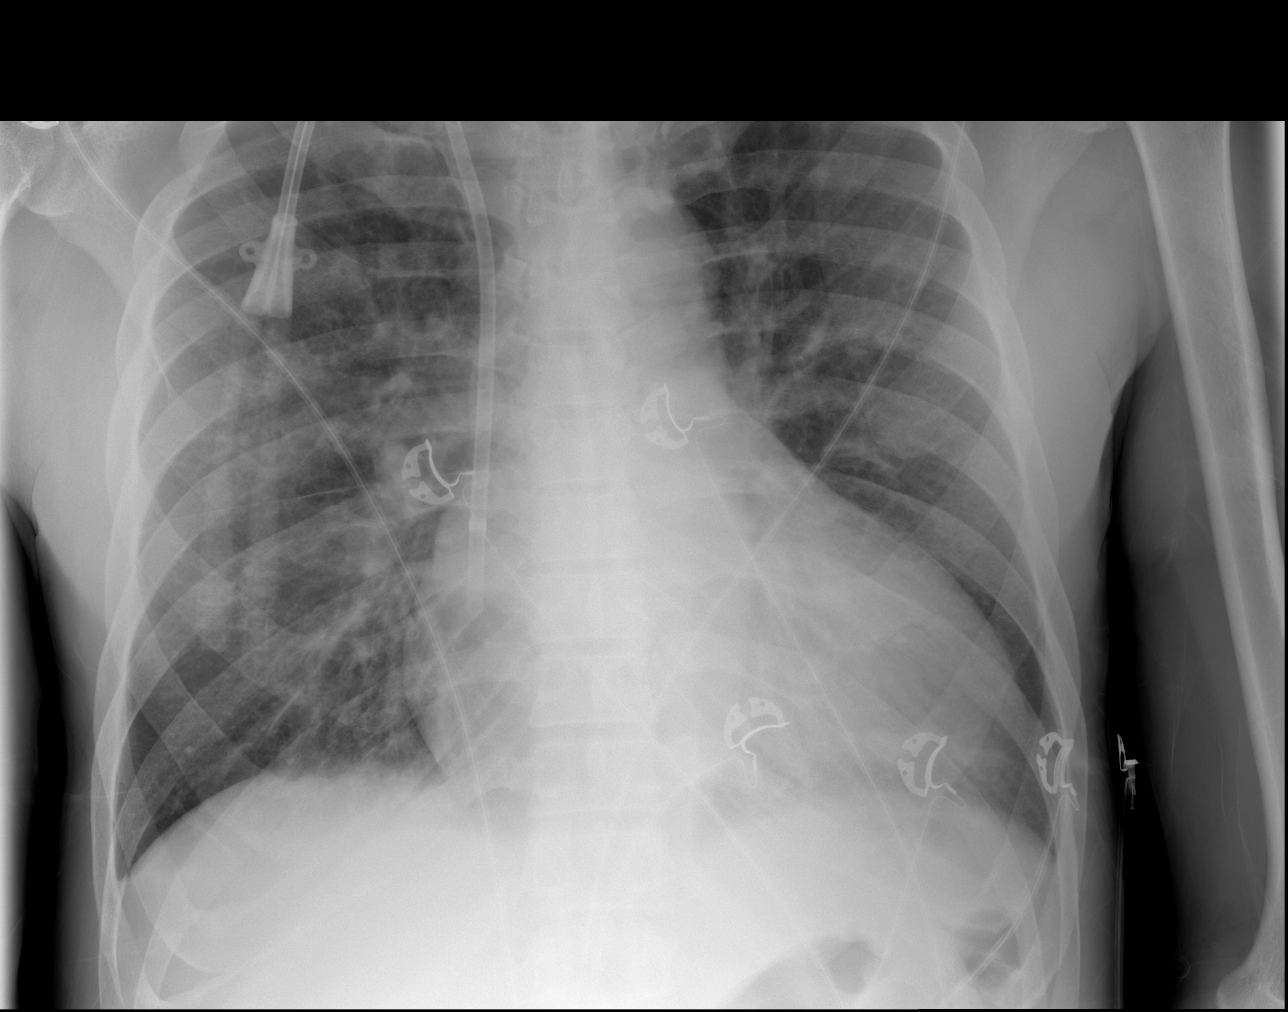

[3 of 3 positions shown; findings below may reference images not displayed]

FINDINGS: Dialysis catheter tip at the superior cavoatrial junction and
unchanged. Prominent central vascular structures without focal
airspace disease or overt pulmonary edema. Heart size is enlarged
but stable. Cannot exclude tiny effusions.
IMPRESSION: Stable cardiomegaly.

Question tiny effusions but no significant pulmonary edema.

Stable position of the dialysis catheter.

## 2016-03-07 NOTE — ED Notes (Signed)
Pt left reporting that he was going to another hospital  He was tired of waiting here

## 2016-03-09 DIAGNOSIS — E8779 Other fluid overload: Secondary | ICD-10-CM | POA: Diagnosis not present

## 2016-03-09 DIAGNOSIS — Z418 Encounter for other procedures for purposes other than remedying health state: Secondary | ICD-10-CM | POA: Diagnosis not present

## 2016-03-09 DIAGNOSIS — N2581 Secondary hyperparathyroidism of renal origin: Secondary | ICD-10-CM | POA: Diagnosis not present

## 2016-03-09 DIAGNOSIS — N186 End stage renal disease: Secondary | ICD-10-CM | POA: Diagnosis not present

## 2016-03-09 DIAGNOSIS — Z7901 Long term (current) use of anticoagulants: Secondary | ICD-10-CM | POA: Diagnosis not present

## 2016-03-11 ENCOUNTER — Encounter (HOSPITAL_COMMUNITY): Payer: Self-pay | Admitting: Emergency Medicine

## 2016-03-11 ENCOUNTER — Telehealth: Payer: Self-pay

## 2016-03-11 ENCOUNTER — Emergency Department (HOSPITAL_COMMUNITY): Payer: Medicare Other

## 2016-03-11 ENCOUNTER — Non-Acute Institutional Stay (HOSPITAL_COMMUNITY)
Admission: EM | Admit: 2016-03-11 | Discharge: 2016-03-11 | Disposition: A | Discharge status: Home / Self Care | Payer: Medicare Other | Attending: Nephrology | Admitting: Nephrology

## 2016-03-11 DIAGNOSIS — Z86711 Personal history of pulmonary embolism: Secondary | ICD-10-CM | POA: Insufficient documentation

## 2016-03-11 DIAGNOSIS — I428 Other cardiomyopathies: Secondary | ICD-10-CM | POA: Diagnosis not present

## 2016-03-11 DIAGNOSIS — Z7952 Long term (current) use of systemic steroids: Secondary | ICD-10-CM | POA: Insufficient documentation

## 2016-03-11 DIAGNOSIS — Z86718 Personal history of other venous thrombosis and embolism: Secondary | ICD-10-CM | POA: Insufficient documentation

## 2016-03-11 DIAGNOSIS — D638 Anemia in other chronic diseases classified elsewhere: Secondary | ICD-10-CM | POA: Diagnosis not present

## 2016-03-11 DIAGNOSIS — R1084 Generalized abdominal pain: Secondary | ICD-10-CM | POA: Insufficient documentation

## 2016-03-11 DIAGNOSIS — Z79899 Other long term (current) drug therapy: Secondary | ICD-10-CM | POA: Insufficient documentation

## 2016-03-11 DIAGNOSIS — Z87891 Personal history of nicotine dependence: Secondary | ICD-10-CM | POA: Insufficient documentation

## 2016-03-11 DIAGNOSIS — I2510 Atherosclerotic heart disease of native coronary artery without angina pectoris: Secondary | ICD-10-CM | POA: Diagnosis not present

## 2016-03-11 DIAGNOSIS — I132 Hypertensive heart and chronic kidney disease with heart failure and with stage 5 chronic kidney disease, or end stage renal disease: Secondary | ICD-10-CM | POA: Diagnosis not present

## 2016-03-11 DIAGNOSIS — Z940 Kidney transplant status: Secondary | ICD-10-CM | POA: Diagnosis not present

## 2016-03-11 DIAGNOSIS — I5042 Chronic combined systolic (congestive) and diastolic (congestive) heart failure: Secondary | ICD-10-CM | POA: Diagnosis not present

## 2016-03-11 DIAGNOSIS — M545 Low back pain: Secondary | ICD-10-CM | POA: Diagnosis not present

## 2016-03-11 DIAGNOSIS — G8929 Other chronic pain: Secondary | ICD-10-CM | POA: Insufficient documentation

## 2016-03-11 DIAGNOSIS — R112 Nausea with vomiting, unspecified: Secondary | ICD-10-CM | POA: Diagnosis present

## 2016-03-11 DIAGNOSIS — R0602 Shortness of breath: Secondary | ICD-10-CM | POA: Diagnosis not present

## 2016-03-11 DIAGNOSIS — I159 Secondary hypertension, unspecified: Secondary | ICD-10-CM | POA: Insufficient documentation

## 2016-03-11 DIAGNOSIS — N186 End stage renal disease: Secondary | ICD-10-CM | POA: Diagnosis not present

## 2016-03-11 DIAGNOSIS — Z9114 Patient's other noncompliance with medication regimen: Secondary | ICD-10-CM | POA: Diagnosis not present

## 2016-03-11 DIAGNOSIS — Z992 Dependence on renal dialysis: Secondary | ICD-10-CM | POA: Diagnosis not present

## 2016-03-11 DIAGNOSIS — I509 Heart failure, unspecified: Secondary | ICD-10-CM | POA: Diagnosis not present

## 2016-03-11 DIAGNOSIS — N2889 Other specified disorders of kidney and ureter: Secondary | ICD-10-CM | POA: Diagnosis not present

## 2016-03-11 DIAGNOSIS — Z7901 Long term (current) use of anticoagulants: Secondary | ICD-10-CM | POA: Insufficient documentation

## 2016-03-11 DIAGNOSIS — Z9115 Patient's noncompliance with renal dialysis: Secondary | ICD-10-CM | POA: Diagnosis not present

## 2016-03-11 DIAGNOSIS — I120 Hypertensive chronic kidney disease with stage 5 chronic kidney disease or end stage renal disease: Secondary | ICD-10-CM | POA: Diagnosis not present

## 2016-03-11 DIAGNOSIS — E1122 Type 2 diabetes mellitus with diabetic chronic kidney disease: Secondary | ICD-10-CM | POA: Insufficient documentation

## 2016-03-11 LAB — PROTIME-INR
INR: 1.2
Prothrombin Time: 15.3 seconds — ABNORMAL HIGH (ref 11.4–15.2)

## 2016-03-11 LAB — COMPREHENSIVE METABOLIC PANEL
ALBUMIN: 2.9 g/dL — AB (ref 3.5–5.0)
ALT: 8 U/L — AB (ref 17–63)
AST: 24 U/L (ref 15–41)
Alkaline Phosphatase: 90 U/L (ref 38–126)
Anion gap: 12 (ref 5–15)
BUN: 35 mg/dL — AB (ref 6–20)
CHLORIDE: 104 mmol/L (ref 101–111)
CO2: 24 mmol/L (ref 22–32)
CREATININE: 12.75 mg/dL — AB (ref 0.61–1.24)
Calcium: 9 mg/dL (ref 8.9–10.3)
GFR calc Af Amer: 5 mL/min — ABNORMAL LOW (ref >60)
GFR, EST NON AFRICAN AMERICAN: 4 mL/min — AB (ref >60)
GLUCOSE: 101 mg/dL — AB (ref 65–99)
Potassium: 5.2 mmol/L — ABNORMAL HIGH (ref 3.5–5.1)
SODIUM: 140 mmol/L (ref 135–145)
Total Bilirubin: 0.6 mg/dL (ref 0.3–1.2)
Total Protein: 6.8 g/dL (ref 6.5–8.1)

## 2016-03-11 LAB — CBC WITH DIFFERENTIAL/PLATELET
BASOS ABS: 0 10*3/uL (ref 0.0–0.1)
BASOS PCT: 1 %
EOS PCT: 8 %
Eosinophils Absolute: 0.4 10*3/uL (ref 0.0–0.7)
HCT: 25.1 % — ABNORMAL LOW (ref 39.0–52.0)
Hemoglobin: 7.9 g/dL — ABNORMAL LOW (ref 13.0–17.0)
LYMPHS PCT: 21 %
Lymphs Abs: 1.1 10*3/uL (ref 0.7–4.0)
MCH: 27.3 pg (ref 26.0–34.0)
MCHC: 31.5 g/dL (ref 30.0–36.0)
MCV: 86.9 fL (ref 78.0–100.0)
Monocytes Absolute: 0.5 10*3/uL (ref 0.1–1.0)
Monocytes Relative: 9 %
NEUTROS ABS: 3.2 10*3/uL (ref 1.7–7.7)
Neutrophils Relative %: 61 %
PLATELETS: 181 10*3/uL (ref 150–400)
RBC: 2.89 MIL/uL — AB (ref 4.22–5.81)
RDW: 16.1 % — ABNORMAL HIGH (ref 11.5–15.5)
WBC: 5.3 10*3/uL (ref 4.0–10.5)

## 2016-03-11 LAB — LIPASE, BLOOD: Lipase: 89 U/L — ABNORMAL HIGH (ref 11–51)
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
## 2016-03-11 MED ORDER — LIDOCAINE HCL (PF) 1 % IJ SOLN: 5.0000 mL | INTRAMUSCULAR | Status: DC | PRN

## 2016-03-11 MED ORDER — HYDROMORPHONE HCL 1 MG/ML IJ SOLN
1.0000 mg | Freq: Once | INTRAMUSCULAR | Status: AC
  Administered 2016-03-11: 1 mg via INTRAVENOUS
  Filled 2016-03-11: qty 1

## 2016-03-11 MED ORDER — SODIUM CHLORIDE 0.9 % IV SOLN: 100.0000 mL | INTRAVENOUS | Status: DC | PRN

## 2016-03-11 MED ORDER — SODIUM CHLORIDE 0.9 % IV BOLUS (SEPSIS): 1000.0000 mL | Freq: Once | INTRAVENOUS | Status: DC

## 2016-03-11 MED ORDER — HEPARIN SODIUM (PORCINE) 1000 UNIT/ML DIALYSIS
2000.0000 [IU] | INTRAMUSCULAR | Status: DC | PRN
  Filled 2016-03-11: qty 2

## 2016-03-11 MED ORDER — ALTEPLASE 2 MG IJ SOLR: 2.0000 mg | Freq: Once | INTRAMUSCULAR | Status: DC | PRN

## 2016-03-11 MED ORDER — HEPARIN SODIUM (PORCINE) 1000 UNIT/ML DIALYSIS
1000.0000 [IU] | INTRAMUSCULAR | Status: DC | PRN
  Filled 2016-03-11: qty 1

## 2016-03-11 MED ORDER — LIDOCAINE-PRILOCAINE 2.5-2.5 % EX CREA
1.0000 | TOPICAL_CREAM | CUTANEOUS | Status: DC | PRN
Start: 2016-03-11 — End: 2016-03-12
  Filled 2016-03-11: qty 5

## 2016-03-11 MED ORDER — PROCHLORPERAZINE EDISYLATE 5 MG/ML IJ SOLN
10.0000 mg | Freq: Once | INTRAMUSCULAR | Status: AC
  Administered 2016-03-11: 10 mg via INTRAVENOUS
  Filled 2016-03-11: qty 2

## 2016-03-11 MED ORDER — PENTAFLUOROPROP-TETRAFLUOROETH EX AERO
1.0000 | INHALATION_SPRAY | CUTANEOUS | Status: DC | PRN
Start: 2016-03-11 — End: 2016-03-12
  Filled 2016-03-11: qty 30

## 2016-03-11 MED ORDER — ONDANSETRON HCL 4 MG/2ML IJ SOLN
4.0000 mg | Freq: Once | INTRAMUSCULAR | Status: AC
  Administered 2016-03-11: 4 mg via INTRAVENOUS
  Filled 2016-03-11: qty 2
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
## 2016-03-11 NOTE — ED Notes (Signed)
MD at bedside.

## 2016-03-11 NOTE — ED Notes (Signed)
Pt given phone to speak with care management.

## 2016-03-11 NOTE — Progress Notes (Addendum)
Updated EDP Eulis Foster about pt [facility] to assist with getting pt assist to another Dialysis company, Pt scheduled for surgery 03/19/16 at [facility] but Dr  Felicie Morn Plus Pt has a pcp appt at [facility] 03/21/16   Pt not in ED at this time OTF

## 2016-03-11 NOTE — ED Provider Notes (Signed)
[facility] DEPT Provider Note   CSN: [id number] Arrival date & time: 03/11/16  0542     History   Chief Complaint Chief Complaint  Patient presents with  . Nausea  . Emesis  . Back Pain    HPI Frank [name] is a 50 y.o. male.  Presents for evaluation of vomiting, abdomen and, low back pain. He states that he last dialyzed 5 days ago at [facility]. Patient does not have a dialysis home. He is getting dialysis at various places. Last week he was discharged from his nephrology practice [facility]), and saw Anesthesia for preoperative clearance who postponed his nephrectomy surgery, because he does not have a active nephrologist. Is reported to have a renal mass suspicious for cancer, in a transplanted kidney. He has frequent emergency department visits, he is noncompliant with dialysis and medication usage. He denies fever, cough, chest pain, weakness or dizziness. He came here by private vehicle. He states that he lives in [place]. He apparently travels to [place] frequently. There are no other known modifying factors.   HPI  Past Medical History:  Diagnosis Date  . Anemia   . Anxiety   . Chronic combined systolic and diastolic CHF (congestive heart failure) (HCC)    a. EF 20-25% by echo in 08/2015 b. echo 10/2015: EF 35-40%, diffuse HK, severe LAE, moderate RAE, small pericardial effusion  . Complication of anesthesia    itching, sore throat  . Depression   . Dialysis patient ([place])   . ESRD (end stage renal disease) ([place])    due to HTN per patient, followed at [facility], s/p failed kidney transplant - dialysis Tue, Th, Sat  . Hyperkalemia 12/2015  . Hypertension   . Junctional rhythm    a. noted in 08/2015: hyperkalemic at that time  b. 12/2015: presented in junctional rhythm w/ K+ of 6.6. Resolved with improvement of K+ levels.  . Nonischemic cardiomyopathy ([place])    a. 08/2014: cath showing minimal CAD, but tortuous arteries noted.   . Renal insufficiency   .  Shortness of breath   . Type II diabetes mellitus (HCC)    No history per patient, but remains under history as A1c would not be accurate given on dialysis    Patient Active Problem List   Diagnosis Date Noted  . Nonischemic cardiomyopathy ([place]) 01/09/2016  . Chronic abdominal pain   . ESRD (end stage renal disease) on dialysis ([place])   . SOB (shortness of breath)   . Generalized abdominal pain   . Faintness   . Dyspnea 01/07/2016  . Hyperkalemia 12/16/2015  . Bilateral low back pain without sciatica   . Pericardial effusion 10/30/2015  . Left renal mass 10/30/2015  . Constipation 10/30/2015  . Hypertensive urgency 09/08/2015  . Adjustment disorder with mixed anxiety and depressed mood 08/20/2015  . Chronic epididymitis 06/19/2015  . Groin pain, chronic, right 06/19/2015  . Incarcerated right inguinal hernia 02/16/2015  . Malnutrition of moderate degree ([place]) 02/03/2015  . Left upper extremity deep vein thrombosis ([place]) 02/01/2015  . Drug-seeking behavior 02/01/2015  . Anemia of chronic disease   . Dyslipidemia   . Chronic pulmonary embolism ([place])   . History of noncompliance with medical treatment   . Chronic combined systolic and diastolic CHF (congestive heart failure) ([place])   . ESRD on dialysis- Tues, Thurs, Sat in [place], nephro: [name] 04/13/2013    Past Surgical History:  Procedure Laterality Date  . CAPD INSERTION    . CAPD REMOVAL    .  INGUINAL HERNIA REPAIR Right 02/14/2015   Procedure: REPAIR INCARCERATED RIGHT INGUINAL HERNIA;  Surgeon: [name], MD;  Location: [facility];  Service: General;  Laterality: Right;  . INSERTION OF DIALYSIS CATHETER Right 09/23/2015   Procedure: exchange of Right internal Dialysis Catheter.;  Surgeon: [name], MD;  Location: [facility];  Service: Vascular;  Laterality: Right;  . KIDNEY RECEIPIENT  2006   failed and started HD in March 2014  . LEFT HEART CATHETERIZATION WITH CORONARY ANGIOGRAM N/A 09/02/2014   Procedure: LEFT  HEART CATHETERIZATION WITH CORONARY ANGIOGRAM;  Surgeon: [name], MD;  Location: [facility] CATH LAB;  Service: Cardiovascular;  Laterality: N/A;       Home Medications    Prior to Admission medications   Medication Sig Start Date End Date Taking? Authorizing Provider  allopurinol (ZYLOPRIM) 100 MG tablet Take 100 mg by mouth daily.    Historical Provider, MD  amLODipine (NORVASC) 10 MG tablet Take 10 mg by mouth daily.    Historical Provider, MD  atorvastatin (LIPITOR) 40 MG tablet Take 1 tablet (40 mg total) by mouth daily at 6 PM. 09/02/14   Barton Dubois, MD  AURYXIA 1 GM 210 MG(Fe) TABS Take 420 mg by mouth 3 (three) times daily. 01/26/16   Historical Provider, MD  carvedilol (COREG) 12.5 MG tablet Take 1 tablet (12.5 mg total) by mouth 2 (two) times daily with a meal. Patient not taking: Reported on 01/08/2016 11/04/15   [name], MD  carvedilol (COREG) 25 MG tablet Take 25 mg by mouth 2 (two) times daily with a meal.    Historical Provider, MD  cinacalcet (SENSIPAR) 30 MG tablet Take 30 mg by mouth daily.    Historical Provider, MD  cloNIDine (CATAPRES - DOSED IN MG/24 HR) 0.3 mg/24hr patch Place 0.3 mg onto the skin once a week.    Historical Provider, MD  doxazosin (CARDURA) 2 MG tablet Take 2 mg by mouth daily.    Historical Provider, MD  famotidine (PEPCID) 20 MG tablet Take 1 tablet (20 mg total) by mouth 2 (two) times daily. 02/13/16   Charlesetta Shanks, MD  gabapentin (NEURONTIN) 100 MG capsule Take 100 mg by mouth 3 (three) times daily.    Historical Provider, MD  hydrALAZINE (APRESOLINE) 100 MG tablet Take 100 mg by mouth every 8 (eight) hours.    Historical Provider, MD  isosorbide mononitrate (IMDUR) 60 MG 24 hr tablet Take 1 tablet (60 mg total) by mouth daily. 01/09/16   Smiley Houseman, MD  omeprazole (PRILOSEC) 20 MG capsule Take 20 mg by mouth daily. 07/01/13   Historical Provider, MD  ondansetron (ZOFRAN ODT) 4 MG disintegrating tablet 25m ODT q4 hours prn  nausea/vomit 02/13/16   MCharlesetta Shanks MD  oxyCODONE-acetaminophen (PERCOCET/ROXICET) 5-325 MG tablet Take 1-2 tablets by mouth every 6 (six) hours as needed for severe pain. 11/13/15   JCarlisle Cater PA-C  predniSONE (DELTASONE) 10 MG tablet Take 10 mg by mouth daily with breakfast.    Historical Provider, MD  promethazine (PHENERGAN) 25 MG tablet Take 1 tablet (25 mg total) by mouth every 8 (eight) hours as needed for nausea or vomiting. 02/11/16   CDalia Heading PA-C  sevelamer carbonate (RENVELA) 800 MG tablet Take 800-1,600 mg by mouth 3 (three) times daily with meals. 2 tabs three times daily with meals, and 1 tablet with snacks    Historical Provider, MD  warfarin (COUMADIN) 2 MG tablet Take 2 mg by mouth daily.  01/31/16   Historical  Provider, MD  warfarin (COUMADIN) 5 MG tablet  01/31/16   Historical Provider, MD    Family History Family History  Problem Relation Age of Onset  . Hypertension Other     Social History Social History  Substance Use Topics  . Smoking status: Former Smoker    Packs/day: 0.00    Years: 1.00    Types: Cigarettes  . Smokeless tobacco: Never Used     Comment: quit Jan 2014  . Alcohol use No     Allergies   Butalbital-apap-caffeine; Ferrlecit [na ferric gluc cplx in sucrose]; Minoxidil; and Darvocet [propoxyphene n-acetaminophen]   Review of Systems Review of Systems  All other systems reviewed and are negative.    Physical Exam Updated Vital Signs BP (!) 183/132   Pulse 73   Temp 98 F (36.7 C)   Resp 16   SpO2 97%   Physical Exam  Constitutional: He is oriented to person, place, and time. He appears well-developed and well-nourished.  HENT:  Head: Normocephalic and atraumatic.  Right Ear: External ear normal.  Left Ear: External ear normal.  Eyes: Conjunctivae and EOM are normal. Pupils are equal, round, and reactive to light.  Neck: Normal range of motion and phonation normal. Neck supple.  Cardiovascular: Normal rate,  regular rhythm and normal heart sounds.   He is hypertensive. Dialysis catheter right upper chest wall, site appears normal.  Pulmonary/Chest: Effort normal and breath sounds normal. He exhibits no bony tenderness.  Abdominal: Soft. He exhibits no mass. There is tenderness (Right mid lateral abdomen, moderate). There is no rebound and no guarding. No hernia.  Musculoskeletal: Normal range of motion.  Neurological: He is alert and oriented to person, place, and time. No cranial nerve deficit or sensory deficit. He exhibits normal muscle tone. Coordination normal.  Skin: Skin is warm, dry and intact.  Psychiatric: He has a normal mood and affect. His behavior is normal. Judgment and thought content normal.  Nursing note and vitals reviewed.    ED Treatments / Results  Labs (all labs ordered are listed, but only abnormal results are displayed) Labs Reviewed  URINALYSIS, ROUTINE W REFLEX MICROSCOPIC (NOT AT [facility])  CBC WITH DIFFERENTIAL/PLATELET  COMPREHENSIVE METABOLIC PANEL  LIPASE, BLOOD  PROTIME-INR    EKG  EKG Interpretation None       Radiology No results found.  Procedures Procedures (including critical care time)  Medications Ordered in ED Medications  sodium chloride 0.9 % bolus 1,000 mL (not administered)  prochlorperazine (COMPAZINE) injection 10 mg (not administered)  HYDROmorphone (DILAUDID) injection 1 mg (not administered)  ondansetron (ZOFRAN) injection 4 mg (4 mg Intravenous Given 03/11/16 2761)     Initial Impression / Assessment and Plan / ED Course  I have reviewed the triage vital signs and the nursing notes.  Pertinent labs & imaging results that were available during my care of the patient were reviewed by me and considered in my medical decision making (see chart for details).  Clinical Course     Medications  ondansetron [facility]) injection 4 mg (4 mg Intravenous Given 03/11/16 0722)  prochlorperazine (COMPAZINE) injection 10 mg (10 mg  Intravenous Given 03/11/16 0809)  HYDROmorphone (DILAUDID) injection 1 mg (1 mg Intravenous Given 03/11/16 0808)    Patient Vitals for the past 24 hrs:  BP Temp Pulse Resp SpO2  03/11/16 1015 (!) 181/123 - 86 - 93 %  03/11/16 0950 (!) 156/108 - 82 - 91 %  03/11/16 0630 (!) 189/130 - 68 17 94 %  03/11/16 0554 (!) 183/132 98 F (36.7 C) 73 16 97 %    11:24 AM Reevaluation with update and discussion. After initial assessment and treatment, an updated evaluation reveals Blood pressure improved somewhat after IV pain medication. I have discussed the case with nephrology, Dr. Jonnie Finner, who states that he will dialyze the patient today. Etheridge Geil L    Final Clinical Impressions(s) / ED Diagnoses   Final diagnoses:  ESRD (end stage renal disease) (HCC)  SOB (shortness of breath)  Abdominal pain, chronic, generalized  Secondary hypertension, unspecified    Chronic abdominal pain, with noncompliance, currently requiring acute dialysis for fluid overload. No indication for further advanced treatment for high blood pressure. Patient should improve with his usual medications.  Nursing Notes Reviewed/ Care Coordinated Applicable Imaging Reviewed Interpretation of Laboratory Data incorporated into ED treatment  Plan- Dialysis today; consider d/c after dialysis.  New Prescriptions New Prescriptions   No medications on file     Daleen Bo, MD 03/11/16 2113

## 2016-03-11 NOTE — Telephone Encounter (Signed)
Call received from Joellyn Quails, RN CM regarding the need for primary care follow up for the patient after discharge. The patient was last seen at the [facility] in 10/2014.  An appointment has been scheduled for 03/21/16 @ 1400 for the patient to follow up with Dr Doreene Burke and the information has been placed on the AVS. The patient's needs to follow up with a nephrologist were also discussed with Dr Claudette Head.   Update provided to K. [name], RN CM

## 2016-03-11 NOTE — ED Notes (Signed)
Pt returned from xray; resting; no needs.

## 2016-03-11 NOTE — Progress Notes (Addendum)
ED Cm noted CM consult - CM reviewed EPIC briefly _ ED CM spoke with EDP, Wentz at 1000 about possible on call Nephrology consult for possible assistance 1005 ED CM spoke with Elmyra Ricks ED RN to get assist to speak with pt Reports pt was unhappy earlier but now better  1010 spoke with pt to get collatorol information Pt with 23 ED visits and 5 admissions in the last 6 months has medicaid of [place] and medicare services per ED registration  Pt confirms with ED Cm he has originally been a resident of [place] but has moved around, [employer] and [place], no pcp, no nephrologist and can not recall any names of any nephrologists connected to his dialysis services nor his 2006 transplant States he was at [facility] [address] in [name] this was his basis dialysis center prior to his transplant and he went to [facility] in [place] after the transplant  Confirms his last dialysis tx was at [facility] in [place] on "thursday" and confirms he was dismissed, "told not to return"  from this practice for "missing appointments", reporting he had 2 appointments at the same time  Pt reports having a surgery that had also been scheduled but now cancelled to remove "a cyst on my kidney" "cancelled" Pt confirms with ED Cm he does live in [place] and [place] is listed on his license  Provided correct address as [place] [address] Confirms only has home number and no cell number Confirms his only Emergency contact is [employer] Confirmed her listed number to be correct in EPIC  Pt confirms with ED CM he has medicare and medicaid CM discussed his access to Medicaid transportation after calling DSS to setup services Pt informs CM he still drives independently Pt denies knowing name of his [place] case worker (obtained Medicaid in [employer])  Pt states his expectations are to have dialysis completed at [facility]  today and to find a new dialysis center Pt states he discussed this with EDP this am. Cm discussed with pt that it is difficult to replace or establish new dialysis center service Cm given permission to contact [facility], DSS and EDP for pt to attempt to assist him Pt voiced understanding that CM would make attempts to assist him but his health care is also his responsibility 1029 Spoke with [facility] CM prior to speaking with pt Had confirmed pt was non compliant with f/u care after his [facility] June 2017 visit and did not f/u with [facility] Last seen in 2016 at [facility] CM assisted with consult to [facility] MD to get pt a f/u appt for 03/21/16 at 1400

## 2016-03-11 NOTE — Progress Notes (Signed)
Dialysis treatment ended with 1 hour remaining per pt. AMA form signed and treatment discontinued per policy

## 2016-03-11 NOTE — Progress Notes (Signed)
Entered in d/c instructions  [facility]  Internal Medicine [phone] [place] Sleeper [phone] [place] [id number]    Next Steps: Go on 03/21/2016    Instructions: at 2:00pm for an appointment with Dr Doreene Burke to establish care with a family doctor in [place], [place]  Urology [phone] [facility] [address] [place] [id number]    Next Steps: Call on 03/19/2016    Instructions: You are scheduled for you procedure at [facility] center with Dr Felicie Morn on 03/19/16 The operating room will call you but you may call operating room at [phone] to check on the time of your procedure     [facility]     [address] [place], [place] [address] [phone]     Next Steps: Call on 03/11/2016    Instructions: Social worker, Pincus Large at [facility] is attempting to assist you to find a new dialysis center &nephrologist- sending your information to [facility] to get an updated on the process & to help her by answering questions

## 2016-03-11 NOTE — ED Triage Notes (Signed)
Pt reports nausea and vomiting for three days with lower back pain. Pt states he has a cyst on his kidney. Pt is currently on dialysis

## 2016-03-11 NOTE — ED Notes (Signed)
Pt has IV in place but unable to draw blood. MD at bedside. Patient is being very rude to staff, demanding where phlebotomy is to draw despite having good veins in other areas. Staff trying to appease patient and remaining professional.

## 2016-03-11 NOTE — ED Notes (Signed)
Pt's IV infiltrated. RN removed; another RN to attempt to insert. Patient is being kinder at this time.

## 2016-03-11 NOTE — ED Notes (Signed)
Patient transported to X-ray 

## 2016-03-11 NOTE — Procedures (Signed)
50 yo AAM w ESRD on HD, failed Tx, CHF EF 25%, anemia, L renal mass and HTN presented to ED today with c/o N/V, abd pain.  States he was discharged from his nephrology practice ([facility], [facility]) last week.  Lives in [place].  Asking to have dialysis here.  No cough, SOB or CP.  No fevers, no vomiting / diarrhea.  Plan HD today , for dc after HD.  4h session, max UF as tolerated.     I was present at this dialysis session, have reviewed the session itself and made  appropriate changes Kelly Splinter MD [place] pager [phone]    cell [phone] 03/11/2016, 1:19 PM

## 2016-03-11 NOTE — ED Notes (Signed)
Xray called to get patient

## 2016-03-12 ENCOUNTER — Telehealth: Payer: Self-pay | Admitting: *Deleted

## 2016-03-12 LAB — HEPATITIS B SURFACE ANTIGEN: HEP B S AG: NEGATIVE

## 2016-03-14 ENCOUNTER — Non-Acute Institutional Stay (HOSPITAL_COMMUNITY)
Admission: EM | Admit: 2016-03-14 | Discharge: 2016-03-15 | Disposition: A | Discharge status: Home / Self Care | Payer: Medicare Other | Attending: Emergency Medicine | Admitting: Emergency Medicine

## 2016-03-14 ENCOUNTER — Encounter (HOSPITAL_COMMUNITY): Payer: Self-pay | Admitting: Emergency Medicine

## 2016-03-14 DIAGNOSIS — R112 Nausea with vomiting, unspecified: Secondary | ICD-10-CM | POA: Insufficient documentation

## 2016-03-14 DIAGNOSIS — G8929 Other chronic pain: Secondary | ICD-10-CM

## 2016-03-14 DIAGNOSIS — Z79899 Other long term (current) drug therapy: Secondary | ICD-10-CM | POA: Diagnosis not present

## 2016-03-14 DIAGNOSIS — Z9114 Patient's other noncompliance with medication regimen: Secondary | ICD-10-CM | POA: Diagnosis not present

## 2016-03-14 DIAGNOSIS — E785 Hyperlipidemia, unspecified: Secondary | ICD-10-CM | POA: Insufficient documentation

## 2016-03-14 DIAGNOSIS — I428 Other cardiomyopathies: Secondary | ICD-10-CM | POA: Diagnosis not present

## 2016-03-14 DIAGNOSIS — E875 Hyperkalemia: Secondary | ICD-10-CM | POA: Diagnosis not present

## 2016-03-14 DIAGNOSIS — S8001XA Contusion of right knee, initial encounter: Secondary | ICD-10-CM | POA: Insufficient documentation

## 2016-03-14 DIAGNOSIS — E1122 Type 2 diabetes mellitus with diabetic chronic kidney disease: Secondary | ICD-10-CM | POA: Diagnosis not present

## 2016-03-14 DIAGNOSIS — I132 Hypertensive heart and chronic kidney disease with heart failure and with stage 5 chronic kidney disease, or end stage renal disease: Secondary | ICD-10-CM | POA: Insufficient documentation

## 2016-03-14 DIAGNOSIS — I5042 Chronic combined systolic (congestive) and diastolic (congestive) heart failure: Secondary | ICD-10-CM | POA: Insufficient documentation

## 2016-03-14 DIAGNOSIS — M25561 Pain in right knee: Secondary | ICD-10-CM | POA: Diagnosis present

## 2016-03-14 DIAGNOSIS — Z87891 Personal history of nicotine dependence: Secondary | ICD-10-CM | POA: Diagnosis not present

## 2016-03-14 DIAGNOSIS — N186 End stage renal disease: Secondary | ICD-10-CM | POA: Diagnosis not present

## 2016-03-14 DIAGNOSIS — Z86711 Personal history of pulmonary embolism: Secondary | ICD-10-CM | POA: Insufficient documentation

## 2016-03-14 DIAGNOSIS — Z940 Kidney transplant status: Secondary | ICD-10-CM | POA: Diagnosis not present

## 2016-03-14 DIAGNOSIS — Z7952 Long term (current) use of systemic steroids: Secondary | ICD-10-CM | POA: Insufficient documentation

## 2016-03-14 DIAGNOSIS — Z9115 Patient's noncompliance with renal dialysis: Secondary | ICD-10-CM | POA: Diagnosis not present

## 2016-03-14 DIAGNOSIS — Z7901 Long term (current) use of anticoagulants: Secondary | ICD-10-CM | POA: Insufficient documentation

## 2016-03-14 DIAGNOSIS — Z992 Dependence on renal dialysis: Secondary | ICD-10-CM | POA: Diagnosis not present

## 2016-03-14 DIAGNOSIS — I159 Secondary hypertension, unspecified: Secondary | ICD-10-CM

## 2016-03-14 LAB — COMPREHENSIVE METABOLIC PANEL
ALT: 9 U/L — AB (ref 17–63)
ANION GAP: 15 (ref 5–15)
AST: 25 U/L (ref 15–41)
Albumin: 3.4 g/dL — ABNORMAL LOW (ref 3.5–5.0)
Alkaline Phosphatase: 96 U/L (ref 38–126)
BUN: 46 mg/dL — ABNORMAL HIGH (ref 6–20)
CALCIUM: 9.6 mg/dL (ref 8.9–10.3)
CHLORIDE: 100 mmol/L — AB (ref 101–111)
CO2: 21 mmol/L — AB (ref 22–32)
CREATININE: 14.51 mg/dL — AB (ref 0.61–1.24)
GFR, EST AFRICAN AMERICAN: 4 mL/min — AB (ref >60)
GFR, EST NON AFRICAN AMERICAN: 3 mL/min — AB (ref >60)
Glucose, Bld: 80 mg/dL (ref 65–99)
Potassium: 6.1 mmol/L — ABNORMAL HIGH (ref 3.5–5.1)
SODIUM: 136 mmol/L (ref 135–145)
Total Bilirubin: 0.7 mg/dL (ref 0.3–1.2)
Total Protein: 8.4 g/dL — ABNORMAL HIGH (ref 6.5–8.1)

## 2016-03-14 LAB — CBC
HCT: 27 % — ABNORMAL LOW (ref 39.0–52.0)
HEMOGLOBIN: 8.5 g/dL — AB (ref 13.0–17.0)
MCH: 27.2 pg (ref 26.0–34.0)
MCHC: 31.5 g/dL (ref 30.0–36.0)
MCV: 86.5 fL (ref 78.0–100.0)
PLATELETS: 183 10*3/uL (ref 150–400)
RBC: 3.12 MIL/uL — AB (ref 4.22–5.81)
RDW: 16 % — ABNORMAL HIGH (ref 11.5–15.5)
WBC: 5.3 10*3/uL (ref 4.0–10.5)

## 2016-03-14 LAB — LIPASE, BLOOD: LIPASE: 20 U/L (ref 11–51)

## 2016-03-14 MED ORDER — ONDANSETRON HCL 4 MG/2ML IJ SOLN: 4.0000 mg | Freq: Once | INTRAMUSCULAR | Status: DC

## 2016-03-14 MED ORDER — ISOSORBIDE MONONITRATE ER 60 MG PO TB24
60.0000 mg | ORAL_TABLET | Freq: Every day | ORAL | Status: DC
  Filled 2016-03-14: qty 1

## 2016-03-14 MED ORDER — CARVEDILOL 12.5 MG PO TABS
25.0000 mg | ORAL_TABLET | Freq: Two times a day (BID) | ORAL | Status: DC
  Administered 2016-03-14: 25 mg via ORAL
  Filled 2016-03-14: qty 2

## 2016-03-14 MED ORDER — OXYCODONE-ACETAMINOPHEN 5-325 MG PO TABS
1.0000 | ORAL_TABLET | Freq: Once | ORAL | Status: AC
  Administered 2016-03-14: 1 via ORAL
  Filled 2016-03-14: qty 1

## 2016-03-14 MED ORDER — OXYCODONE-ACETAMINOPHEN 5-325 MG PO TABS
1.0000 | ORAL_TABLET | Freq: Four times a day (QID) | ORAL | Status: DC | PRN
  Administered 2016-03-15 (×3): 2 via ORAL
  Filled 2016-03-14: qty 2

## 2016-03-14 MED ORDER — ONDANSETRON 4 MG PO TBDP
4.0000 mg | ORAL_TABLET | Freq: Once | ORAL | Status: AC | PRN
  Administered 2016-03-14: 4 mg via ORAL

## 2016-03-14 MED ORDER — PROMETHAZINE HCL 25 MG PO TABS: 25.0000 mg | ORAL_TABLET | Freq: Three times a day (TID) | ORAL | Status: DC | PRN

## 2016-03-14 MED ORDER — PROMETHAZINE HCL 25 MG/ML IJ SOLN
25.0000 mg | Freq: Once | INTRAMUSCULAR | Status: AC
  Administered 2016-03-14: 25 mg via INTRAMUSCULAR
  Filled 2016-03-14: qty 1

## 2016-03-14 MED ORDER — AMLODIPINE BESYLATE 5 MG PO TABS
10.0000 mg | ORAL_TABLET | Freq: Every day | ORAL | Status: DC
  Administered 2016-03-14: 10 mg via ORAL
  Filled 2016-03-14 (×3): qty 2

## 2016-03-14 MED ORDER — ONDANSETRON HCL 4 MG/2ML IJ SOLN
4.0000 mg | Freq: Once | INTRAMUSCULAR | Status: DC
  Filled 2016-03-14: qty 2

## 2016-03-14 MED ORDER — SODIUM POLYSTYRENE SULFONATE 15 GM/60ML PO SUSP
30.0000 g | Freq: Once | ORAL | Status: DC
Start: 2016-03-14 — End: 2016-03-15
  Filled 2016-03-14: qty 120

## 2016-03-14 MED ORDER — HYDRALAZINE HCL 50 MG PO TABS
100.0000 mg | ORAL_TABLET | Freq: Three times a day (TID) | ORAL | Status: DC
  Filled 2016-03-14 (×3): qty 2

## 2016-03-14 MED ORDER — ONDANSETRON 4 MG PO TBDP
ORAL_TABLET | ORAL | Status: AC
  Filled 2016-03-14: qty 1
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
## 2016-03-14 NOTE — ED Notes (Signed)
IV team at bedside at this time.
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
## 2016-03-14 NOTE — ED Notes (Signed)
MD aware of pt's high blood pressure at this time.

## 2016-03-14 NOTE — ED Notes (Signed)
MD at bedside. 

## 2016-03-14 NOTE — ED Notes (Signed)
Pt brought to bathroom via w/c to have BM. Pt unable to void, pt does "not produce urine" per pt.

## 2016-03-15 LAB — IRON AND TIBC
Iron: 62 ug/dL (ref 45–182)
Saturation Ratios: 33 % (ref 17.9–39.5)
TIBC: 188 ug/dL — ABNORMAL LOW (ref 250–450)
UIBC: 126 ug/dL

## 2016-03-15 MED ORDER — OXYCODONE-ACETAMINOPHEN 5-325 MG PO TABS
ORAL_TABLET | ORAL | Status: AC
  Administered 2016-03-15: 2 via ORAL
  Filled 2016-03-15: qty 2

## 2016-03-15 MED ORDER — LIDOCAINE HCL (PF) 1 % IJ SOLN: 5.0000 mL | INTRAMUSCULAR | Status: DC | PRN

## 2016-03-15 MED ORDER — ALTEPLASE 2 MG IJ SOLR: 2.0000 mg | Freq: Once | INTRAMUSCULAR | Status: DC | PRN

## 2016-03-15 MED ORDER — OXYCODONE-ACETAMINOPHEN 5-325 MG PO TABS
ORAL_TABLET | ORAL | Status: AC
  Filled 2016-03-15: qty 2

## 2016-03-15 MED ORDER — FERUMOXYTOL INJECTION 510 MG/17 ML
510.0000 mg | Freq: Once | INTRAVENOUS | Status: AC
  Administered 2016-03-15: 510 mg via INTRAVENOUS
  Filled 2016-03-15 (×3): qty 17

## 2016-03-15 MED ORDER — HEPARIN SODIUM (PORCINE) 1000 UNIT/ML DIALYSIS: 1000.0000 [IU] | INTRAMUSCULAR | Status: DC | PRN

## 2016-03-15 MED ORDER — DARBEPOETIN ALFA 200 MCG/0.4ML IJ SOSY
200.0000 ug | PREFILLED_SYRINGE | Freq: Once | INTRAMUSCULAR | Status: AC
  Administered 2016-03-15: 200 ug via INTRAVENOUS

## 2016-03-15 MED ORDER — PENTAFLUOROPROP-TETRAFLUOROETH EX AERO
1.0000 | INHALATION_SPRAY | CUTANEOUS | Status: DC | PRN
Start: 2016-03-15 — End: 2016-03-15

## 2016-03-15 MED ORDER — HEPARIN SODIUM (PORCINE) 1000 UNIT/ML DIALYSIS: 3000.0000 [IU] | INTRAMUSCULAR | Status: DC | PRN

## 2016-03-15 MED ORDER — SODIUM CHLORIDE 0.9 % IV SOLN: 100.0000 mL | INTRAVENOUS | Status: DC | PRN

## 2016-03-15 MED ORDER — DARBEPOETIN ALFA 200 MCG/0.4ML IJ SOSY
PREFILLED_SYRINGE | INTRAMUSCULAR | Status: AC
  Administered 2016-03-15: 200 ug via INTRAVENOUS
  Filled 2016-03-15: qty 0.4

## 2016-03-15 MED ORDER — LIDOCAINE-PRILOCAINE 2.5-2.5 % EX CREA: 1.0000 "application " | TOPICAL_CREAM | CUTANEOUS | Status: DC | PRN

## 2016-03-15 NOTE — ED Notes (Signed)
Patient given [place] sandwich per request.  Patient okay'd to go to dialysis [facility] once she has eaten.

## 2016-03-15 NOTE — ED Notes (Signed)
Patient meal ordered.   Okay'd to eat by Dr. Winfred Leeds.

## 2016-03-15 NOTE — ED Provider Notes (Signed)
6:39 AM I spoke with Nephrology on call who will place orders to have the patient dialyzed this morning.   Everlene Balls, MD 03/15/16 [phone]

## 2016-03-15 NOTE — ED Notes (Signed)
Patient being transferred to dialysis.

## 2016-03-15 NOTE — ED Notes (Signed)
Pt c/o pain in back and legs.

## 2016-03-15 NOTE — Progress Notes (Signed)
Dialysis treatment completed.  4000 mL ultrafiltrated.  3500 mL net fluid removal.  Patient status unchanged. Lung sounds clear to ausculation in all fields. Generalized edema. Cardiac: NSR.  Cleansed RIJ catheter with chlorhexidine.  Disconnected lines and flushed ports with saline per protocol.  Ports locked with heparin and capped per protocol.    Dressing changed per protocol.  Treatment time extended to to late administration of IV iron.  Pt discharging home per self.

## 2016-03-15 NOTE — Procedures (Signed)
Patient w/o HD unit, asked to see for HD today.  Plan HD w 4-5kg off, 4h, give max ESA darbe 200 ug Last ESA in April Last Fe in April, got feraheme (has allergy to Ferrlicit (ferr gluconate)) DC home after HD   I was present at this dialysis session, have reviewed the session itself and made  appropriate changes Kelly Splinter MD [employer] pager [phone]    cell [phone] 03/15/2016, 8:42 AM

## 2016-03-19 ENCOUNTER — Encounter (HOSPITAL_COMMUNITY): Payer: Self-pay

## 2016-03-19 ENCOUNTER — Emergency Department (HOSPITAL_COMMUNITY)
Admission: EM | Admit: 2016-03-19 | Discharge: 2016-03-19 | Disposition: A | Discharge status: Home / Self Care | Payer: Medicare Other | Attending: Emergency Medicine | Admitting: Emergency Medicine

## 2016-03-19 DIAGNOSIS — T8249XA Other complication of vascular dialysis catheter, initial encounter: Secondary | ICD-10-CM | POA: Diagnosis present

## 2016-03-19 DIAGNOSIS — I10 Essential (primary) hypertension: Secondary | ICD-10-CM

## 2016-03-19 DIAGNOSIS — Z79899 Other long term (current) drug therapy: Secondary | ICD-10-CM | POA: Diagnosis not present

## 2016-03-19 DIAGNOSIS — E1122 Type 2 diabetes mellitus with diabetic chronic kidney disease: Secondary | ICD-10-CM | POA: Diagnosis not present

## 2016-03-19 DIAGNOSIS — Z992 Dependence on renal dialysis: Secondary | ICD-10-CM | POA: Insufficient documentation

## 2016-03-19 DIAGNOSIS — Z87891 Personal history of nicotine dependence: Secondary | ICD-10-CM | POA: Diagnosis not present

## 2016-03-19 DIAGNOSIS — N186 End stage renal disease: Secondary | ICD-10-CM | POA: Diagnosis not present

## 2016-03-19 DIAGNOSIS — I132 Hypertensive heart and chronic kidney disease with heart failure and with stage 5 chronic kidney disease, or end stage renal disease: Secondary | ICD-10-CM | POA: Diagnosis not present

## 2016-03-19 DIAGNOSIS — E8779 Other fluid overload: Secondary | ICD-10-CM | POA: Diagnosis not present

## 2016-03-19 DIAGNOSIS — N2581 Secondary hyperparathyroidism of renal origin: Secondary | ICD-10-CM | POA: Diagnosis not present

## 2016-03-19 DIAGNOSIS — I5042 Chronic combined systolic (congestive) and diastolic (congestive) heart failure: Secondary | ICD-10-CM | POA: Insufficient documentation

## 2016-03-19 DIAGNOSIS — Z418 Encounter for other procedures for purposes other than remedying health state: Secondary | ICD-10-CM | POA: Diagnosis not present

## 2016-03-19 DIAGNOSIS — Z7901 Long term (current) use of anticoagulants: Secondary | ICD-10-CM | POA: Diagnosis not present

## 2016-03-19 DIAGNOSIS — Y732 Prosthetic and other implants, materials and accessory gastroenterology and urology devices associated with adverse incidents: Secondary | ICD-10-CM | POA: Insufficient documentation

## 2016-03-19 LAB — CBC
HCT: 25.3 % — ABNORMAL LOW (ref 39.0–52.0)
HEMOGLOBIN: 7.9 g/dL — AB (ref 13.0–17.0)
MCH: 27.3 pg (ref 26.0–34.0)
MCHC: 31.2 g/dL (ref 30.0–36.0)
MCV: 87.5 fL (ref 78.0–100.0)
Platelets: 221 10*3/uL (ref 150–400)
RBC: 2.89 MIL/uL — AB (ref 4.22–5.81)
RDW: 16 % — ABNORMAL HIGH (ref 11.5–15.5)
WBC: 7.1 10*3/uL (ref 4.0–10.5)

## 2016-03-19 LAB — COMPREHENSIVE METABOLIC PANEL
ALT: 9 U/L — AB (ref 17–63)
ANION GAP: 13 (ref 5–15)
AST: 23 U/L (ref 15–41)
Albumin: 3.2 g/dL — ABNORMAL LOW (ref 3.5–5.0)
Alkaline Phosphatase: 114 U/L (ref 38–126)
BUN: 59 mg/dL — ABNORMAL HIGH (ref 6–20)
CHLORIDE: 104 mmol/L (ref 101–111)
CO2: 22 mmol/L (ref 22–32)
Calcium: 9 mg/dL (ref 8.9–10.3)
Creatinine, Ser: 15.97 mg/dL — ABNORMAL HIGH (ref 0.61–1.24)
GFR calc non Af Amer: 3 mL/min — ABNORMAL LOW (ref >60)
GFR, EST AFRICAN AMERICAN: 3 mL/min — AB (ref >60)
Glucose, Bld: 103 mg/dL — ABNORMAL HIGH (ref 65–99)
Potassium: 5.1 mmol/L (ref 3.5–5.1)
SODIUM: 139 mmol/L (ref 135–145)
Total Bilirubin: 0.4 mg/dL (ref 0.3–1.2)
Total Protein: 7.9 g/dL (ref 6.5–8.1)

## 2016-03-19 LAB — LIPASE, BLOOD: LIPASE: 29 U/L (ref 11–51)

## 2016-03-19 MED ORDER — ONDANSETRON 4 MG PO TBDP
ORAL_TABLET | ORAL | Status: AC
  Filled 2016-03-19: qty 1

## 2016-03-19 MED ORDER — ONDANSETRON 4 MG PO TBDP
8.0000 mg | ORAL_TABLET | Freq: Once | ORAL | Status: AC
  Administered 2016-03-19: 8 mg via ORAL
  Filled 2016-03-19: qty 2

## 2016-03-19 MED ORDER — ONDANSETRON 4 MG PO TBDP
4.0000 mg | ORAL_TABLET | Freq: Once | ORAL | Status: AC | PRN
  Administered 2016-03-19: 4 mg via ORAL

## 2016-03-19 NOTE — ED Notes (Signed)
Re-paged Dr. Florene Glen x2 to [phone]

## 2016-03-19 NOTE — ED Notes (Signed)
Paged Dr. Florene Glen to [phone]

## 2016-03-19 NOTE — Discharge Instructions (Signed)
Go to your dialysis center now.  ([facility] in [facility])

## 2016-03-19 NOTE — ED Notes (Signed)
Pt ambulatory at discharge without any distress. Pt is to go straight to dialysis after leaving here and that is confirmed with nephrologist. Pt witnessed leaving department, left papers at the bedside. States "You don't have to read all that to me, I got it."

## 2016-03-19 NOTE — ED Provider Notes (Signed)
Pt d/w Dr. Florene Glen (on call for nephrology).  He called pt's dialysis center ([facility] in [facility]) who said pt is scheduled this morning for dialysis.  He verified with the nurse that he can get a full dialysis if he gets there by 11.  Pt told of this and encouraged to go there right now.  He has no emergent need of dialysis as an inpatient.  Pt understands and will be discharged.   Isla Pence, MD 03/19/16 [phone]

## 2016-03-19 NOTE — ED Triage Notes (Signed)
Pt requesting Dialysis. Pt states last treatment here on Thursday. Pt states told to return tonight for same. Pt states nausea/vomiting.

## 2016-03-19 NOTE — ED Provider Notes (Signed)
[facility] DEPT Provider Note   CSN: [id number] Arrival date & time: 03/19/16  0026     History   Chief Complaint Chief Complaint  Patient presents with  . Vascular Access Problem    needs dialysis    HPI Frank [name] is a 50 y.o. male.  Patient presents for evaluation and dialysis. Patient last underwent dialysis on August 25. She presents this morning because he feels like he needs dialysis. He is not having any chest pain or significant shortness of breath.      Past Medical History:  Diagnosis Date  . Anemia   . Anxiety   . Chronic combined systolic and diastolic CHF (congestive heart failure) (HCC)    a. EF 20-25% by echo in 08/2015 b. echo 10/2015: EF 35-40%, diffuse HK, severe LAE, moderate RAE, small pericardial effusion  . Complication of anesthesia    itching, sore throat  . Depression   . Dialysis patient ([place])   . ESRD (end stage renal disease) ([place])    due to HTN per patient, followed at [facility], s/p failed kidney transplant - dialysis Tue, Th, Sat  . Hyperkalemia 12/2015  . Hypertension   . Junctional rhythm    a. noted in 08/2015: hyperkalemic at that time  b. 12/2015: presented in junctional rhythm w/ K+ of 6.6. Resolved with improvement of K+ levels.  . Nonischemic cardiomyopathy ([place])    a. 08/2014: cath showing minimal CAD, but tortuous arteries noted.   . Renal insufficiency   . Shortness of breath   . Type II diabetes mellitus (HCC)    No history per patient, but remains under history as A1c would not be accurate given on dialysis    Patient Active Problem List   Diagnosis Date Noted  . Uremia 03/11/2016  . Nonischemic cardiomyopathy ([name]) 01/09/2016  . Chronic abdominal pain   . ESRD (end stage renal disease) on dialysis ([place])   . SOB (shortness of breath)   . Generalized abdominal pain   . Faintness   . Dyspnea 01/07/2016  . Hyperkalemia 12/16/2015  . Bilateral low back pain without sciatica   . Pericardial effusion 10/30/2015  .  Left renal mass 10/30/2015  . Constipation 10/30/2015  . Hypertensive urgency 09/08/2015  . Adjustment disorder with mixed anxiety and depressed mood 08/20/2015  . Chronic epididymitis 06/19/2015  . Groin pain, chronic, right 06/19/2015  . Incarcerated right inguinal hernia 02/16/2015  . Malnutrition of moderate degree ([place]) 02/03/2015  . Left upper extremity deep vein thrombosis ([place]) 02/01/2015  . Drug-seeking behavior 02/01/2015  . Anemia of chronic disease   . Dyslipidemia   . Chronic pulmonary embolism ([place])   . History of noncompliance with medical treatment   . Chronic combined systolic and diastolic CHF (congestive heart failure) ([place])   . ESRD on dialysis- Tues, Thurs, Sat in [place], nephro: [name] 04/13/2013    Past Surgical History:  Procedure Laterality Date  . CAPD INSERTION    . CAPD REMOVAL    . INGUINAL HERNIA REPAIR Right 02/14/2015   Procedure: REPAIR INCARCERATED RIGHT INGUINAL HERNIA;  Surgeon: [name], MD;  Location: [facility];  Service: General;  Laterality: Right;  . INSERTION OF DIALYSIS CATHETER Right 09/23/2015   Procedure: exchange of Right internal Dialysis Catheter.;  Surgeon: [name], MD;  Location: [facility];  Service: Vascular;  Laterality: Right;  . KIDNEY RECEIPIENT  2006   failed and started HD in March 2014  . LEFT HEART CATHETERIZATION WITH CORONARY ANGIOGRAM  N/A 09/02/2014   Procedure: LEFT HEART CATHETERIZATION WITH CORONARY ANGIOGRAM;  Surgeon: [name], MD;  Location: [facility] CATH LAB;  Service: Cardiovascular;  Laterality: N/A;       Home Medications    Prior to Admission medications   Medication Sig Start Date End Date Taking? Authorizing Provider  allopurinol (ZYLOPRIM) 100 MG tablet Take 100 mg by mouth daily.    Historical Provider, MD  amLODipine (NORVASC) 10 MG tablet Take 10 mg by mouth daily.    Historical Provider, MD  atorvastatin (LIPITOR) 40 MG tablet Take 1 tablet (40 mg total) by mouth daily at 6 PM. 09/02/14    Barton Dubois, MD  AURYXIA 1 GM 210 MG(Fe) TABS Take 420 mg by mouth 3 (three) times daily. 01/26/16   Historical Provider, MD  carvedilol (COREG) 12.5 MG tablet Take 1 tablet (12.5 mg total) by mouth 2 (two) times daily with a meal. Patient not taking: Reported on 01/08/2016 11/04/15   [name], MD  carvedilol (COREG) 25 MG tablet Take 25 mg by mouth 2 (two) times daily with a meal.    Historical Provider, MD  cinacalcet (SENSIPAR) 30 MG tablet Take 30 mg by mouth daily.    Historical Provider, MD  cloNIDine (CATAPRES - DOSED IN MG/24 HR) 0.3 mg/24hr patch Place 0.3 mg onto the skin once a week.    Historical Provider, MD  doxazosin (CARDURA) 2 MG tablet Take 2 mg by mouth daily.    Historical Provider, MD  famotidine (PEPCID) 20 MG tablet Take 1 tablet (20 mg total) by mouth 2 (two) times daily. 02/13/16   Charlesetta Shanks, MD  gabapentin (NEURONTIN) 100 MG capsule Take 100 mg by mouth 3 (three) times daily.    Historical Provider, MD  hydrALAZINE (APRESOLINE) 100 MG tablet Take 100 mg by mouth every 8 (eight) hours.    Historical Provider, MD  isosorbide mononitrate (IMDUR) 60 MG 24 hr tablet Take 1 tablet (60 mg total) by mouth daily. 01/09/16   Smiley Houseman, MD  omeprazole (PRILOSEC) 20 MG capsule Take 20 mg by mouth daily. 07/01/13   Historical Provider, MD  ondansetron (ZOFRAN ODT) 4 MG disintegrating tablet 30m ODT q4 hours prn nausea/vomit 02/13/16   MCharlesetta Shanks MD  oxyCODONE-acetaminophen (PERCOCET/ROXICET) 5-325 MG tablet Take 1-2 tablets by mouth every 6 (six) hours as needed for severe pain. 11/13/15   JCarlisle Cater PA-C  predniSONE (DELTASONE) 10 MG tablet Take 10 mg by mouth daily with breakfast.    Historical Provider, MD  promethazine (PHENERGAN) 25 MG tablet Take 1 tablet (25 mg total) by mouth every 8 (eight) hours as needed for nausea or vomiting. 02/11/16   CDalia Heading PA-C  sevelamer carbonate (RENVELA) 800 MG tablet Take 800-1,600 mg by mouth 3 (three) times  daily with meals. 2 tabs three times daily with meals, and 1 tablet with snacks    Historical Provider, MD  warfarin (COUMADIN) 2 MG tablet Take 2 mg by mouth daily.  01/31/16   Historical Provider, MD  warfarin (COUMADIN) 5 MG tablet  01/31/16   Historical Provider, MD  warfarin (COUMADIN) 6 MG tablet Take 6 mg by mouth. 02/28/16   Historical Provider, MD    Family History Family History  Problem Relation Age of Onset  . Hypertension Other     Social History Social History  Substance Use Topics  . Smoking status: Former Smoker    Packs/day: 0.00    Years: 1.00    Types: Cigarettes  .  Smokeless tobacco: Never Used     Comment: quit Jan 2014  . Alcohol use No     Allergies   Butalbital-apap-caffeine; Ferrlecit [na ferric gluc cplx in sucrose]; Minoxidil; and Darvocet [propoxyphene n-acetaminophen]   Review of Systems Review of Systems  Respiratory: Negative for shortness of breath.   Cardiovascular: Negative for chest pain.  All other systems reviewed and are negative.    Physical Exam Updated Vital Signs BP (!) 178/116 (BP Location: Left Arm)   Pulse 87   Temp 98.2 F (36.8 C) (Oral)   Resp 18   Ht _0  (1.88 m)   Wt 164 lb 1 oz (74.4 kg)   SpO2 97%   BMI 21.06 kg/m   Physical Exam  Constitutional: He is oriented to person, place, and time. He appears well-developed and well-nourished. No distress.  HENT:  Head: Normocephalic and atraumatic.  Right Ear: Hearing normal.  Left Ear: Hearing normal.  Nose: Nose normal.  Mouth/Throat: Oropharynx is clear and moist and mucous membranes are normal.  Eyes: Conjunctivae and EOM are normal. Pupils are equal, round, and reactive to light.  Neck: Normal range of motion. Neck supple.  Cardiovascular: Regular rhythm, S1 normal and S2 normal.  Exam reveals no gallop and no friction rub.   No murmur heard. Pulmonary/Chest: Effort normal and breath sounds normal. No respiratory distress. He exhibits no tenderness.    Abdominal: Soft. Normal appearance and bowel sounds are normal. There is no hepatosplenomegaly. There is no tenderness. There is no rebound, no guarding, no tenderness at McBurney's point and negative Murphy's sign. No hernia.  Musculoskeletal: Normal range of motion.  Neurological: He is alert and oriented to person, place, and time. He has normal strength. No cranial nerve deficit or sensory deficit. Coordination normal. GCS eye subscore is 4. GCS verbal subscore is 5. GCS motor subscore is 6.  Skin: Skin is warm, dry and intact. No rash noted. No cyanosis.  Psychiatric: He has a normal mood and affect. His speech is normal and behavior is normal. Thought content normal.  Nursing note and vitals reviewed.    ED Treatments / Results  Labs (all labs ordered are listed, but only abnormal results are displayed) Labs Reviewed  COMPREHENSIVE METABOLIC PANEL - Abnormal; Notable for the following:       Result Value   Glucose, Bld 103 (*)    BUN 59 (*)    Creatinine, Ser 15.97 (*)    Albumin 3.2 (*)    ALT 9 (*)    GFR calc non Af Amer 3 (*)    GFR calc Af Amer 3 (*)    All other components within normal limits  CBC - Abnormal; Notable for the following:    RBC 2.89 (*)    Hemoglobin 7.9 (*)    HCT 25.3 (*)    RDW 16.0 (*)    All other components within normal limits  LIPASE, BLOOD    EKG  EKG Interpretation None       Radiology No results found.  Procedures Procedures (including critical care time)  Medications Ordered in ED Medications  ondansetron (ZOFRAN-ODT) 4 MG disintegrating tablet (not administered)  ondansetron (ZOFRAN-ODT) disintegrating tablet 4 mg (4 mg Oral Given 03/19/16 0048)     Initial Impression / Assessment and Plan / ED Course  I have reviewed the triage vital signs and the nursing notes.  Pertinent labs & imaging results that were available during my care of the patient were reviewed by me and  considered in my medical decision making (see chart  for details).  Clinical Course    Presents to the emergency department for dialysis. Patient has end-stage renal disease and is on hemodialysis. He does not currently have a dialysis center, comes to the ER to have his dialysis. He was last dialyzed 4 days ago. He does not appear to be significantly volume overloaded. Potassium is not significantly elevated. We'll contact nephrology and arrange for dialysis this morning.  Patient is hypertensive at arrival. He has a history of hypertension on multiple agents. Patient will be given morning meds.  Final Clinical Impressions(s) / ED Diagnoses   Final diagnoses:  ESRD (end stage renal disease) ([place])  Essential hypertension    New Prescriptions New Prescriptions   No medications on file     Orpah Greek, MD 03/19/16 [phone]

## 2016-03-20 DIAGNOSIS — Z886 Allergy status to analgesic agent status: Secondary | ICD-10-CM | POA: Diagnosis not present

## 2016-03-20 DIAGNOSIS — E875 Hyperkalemia: Secondary | ICD-10-CM | POA: Diagnosis not present

## 2016-03-20 DIAGNOSIS — Z7901 Long term (current) use of anticoagulants: Secondary | ICD-10-CM | POA: Diagnosis not present

## 2016-03-20 DIAGNOSIS — Z885 Allergy status to narcotic agent status: Secondary | ICD-10-CM | POA: Diagnosis not present

## 2016-03-20 DIAGNOSIS — I120 Hypertensive chronic kidney disease with stage 5 chronic kidney disease or end stage renal disease: Secondary | ICD-10-CM | POA: Diagnosis not present

## 2016-03-20 DIAGNOSIS — Z79899 Other long term (current) drug therapy: Secondary | ICD-10-CM | POA: Diagnosis not present

## 2016-03-20 DIAGNOSIS — Z7952 Long term (current) use of systemic steroids: Secondary | ICD-10-CM | POA: Diagnosis not present

## 2016-03-20 DIAGNOSIS — N186 End stage renal disease: Secondary | ICD-10-CM | POA: Diagnosis not present

## 2016-03-20 DIAGNOSIS — Z888 Allergy status to other drugs, medicaments and biological substances status: Secondary | ICD-10-CM | POA: Diagnosis not present

## 2016-03-20 DIAGNOSIS — Z792 Long term (current) use of antibiotics: Secondary | ICD-10-CM | POA: Diagnosis not present

## 2016-03-20 DIAGNOSIS — Z992 Dependence on renal dialysis: Secondary | ICD-10-CM | POA: Diagnosis not present

## 2016-03-20 DIAGNOSIS — Z940 Kidney transplant status: Secondary | ICD-10-CM | POA: Diagnosis not present

## 2016-03-20 IMAGING — CR DG CHEST 2V
2 series · 2 of 2 positions shown · non-contrast
Comparison: 10/17/2013

CLINICAL DATA: Cough, chest pain, headache.

EXAM:
CHEST  2 VIEW

[w chest lat]
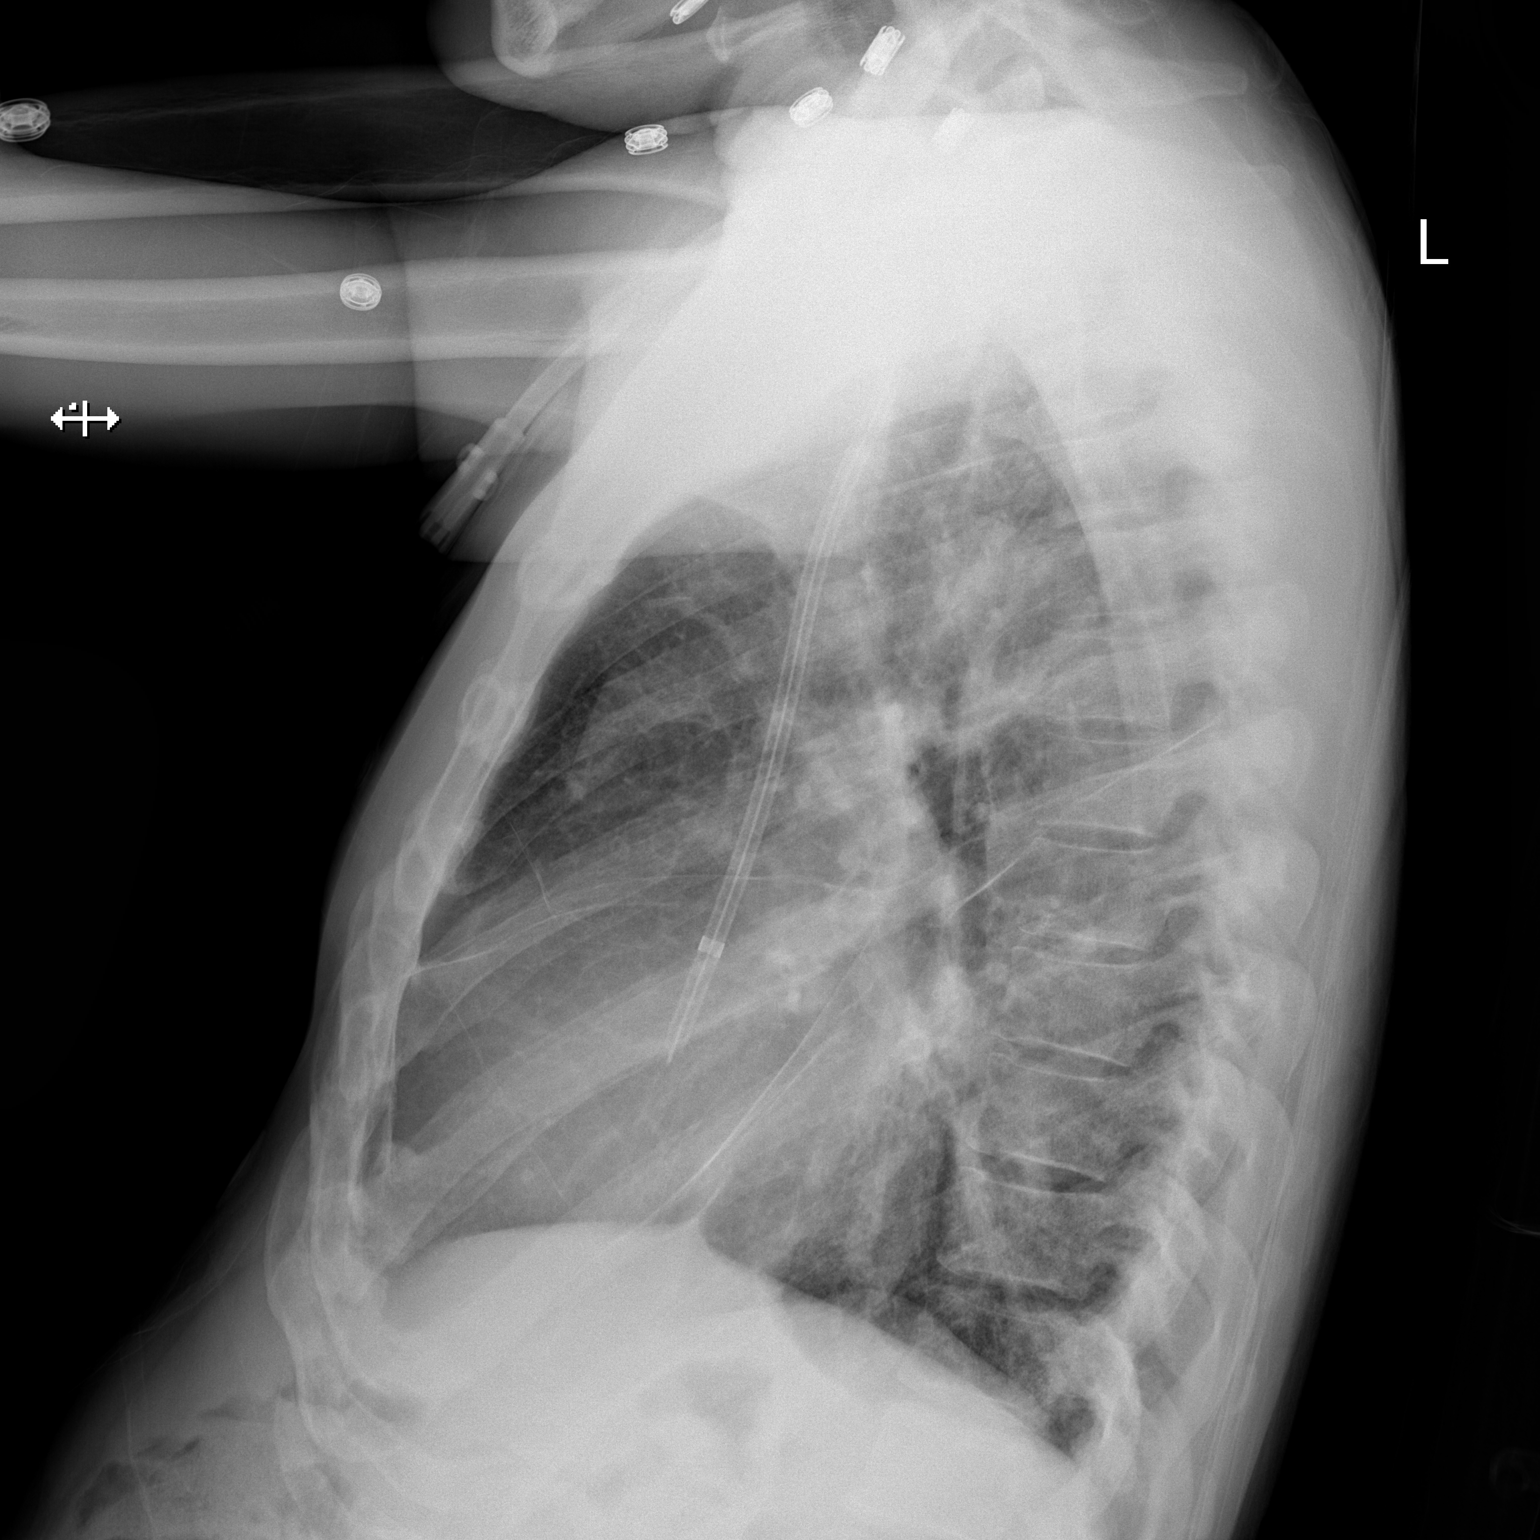

[x chest ap]
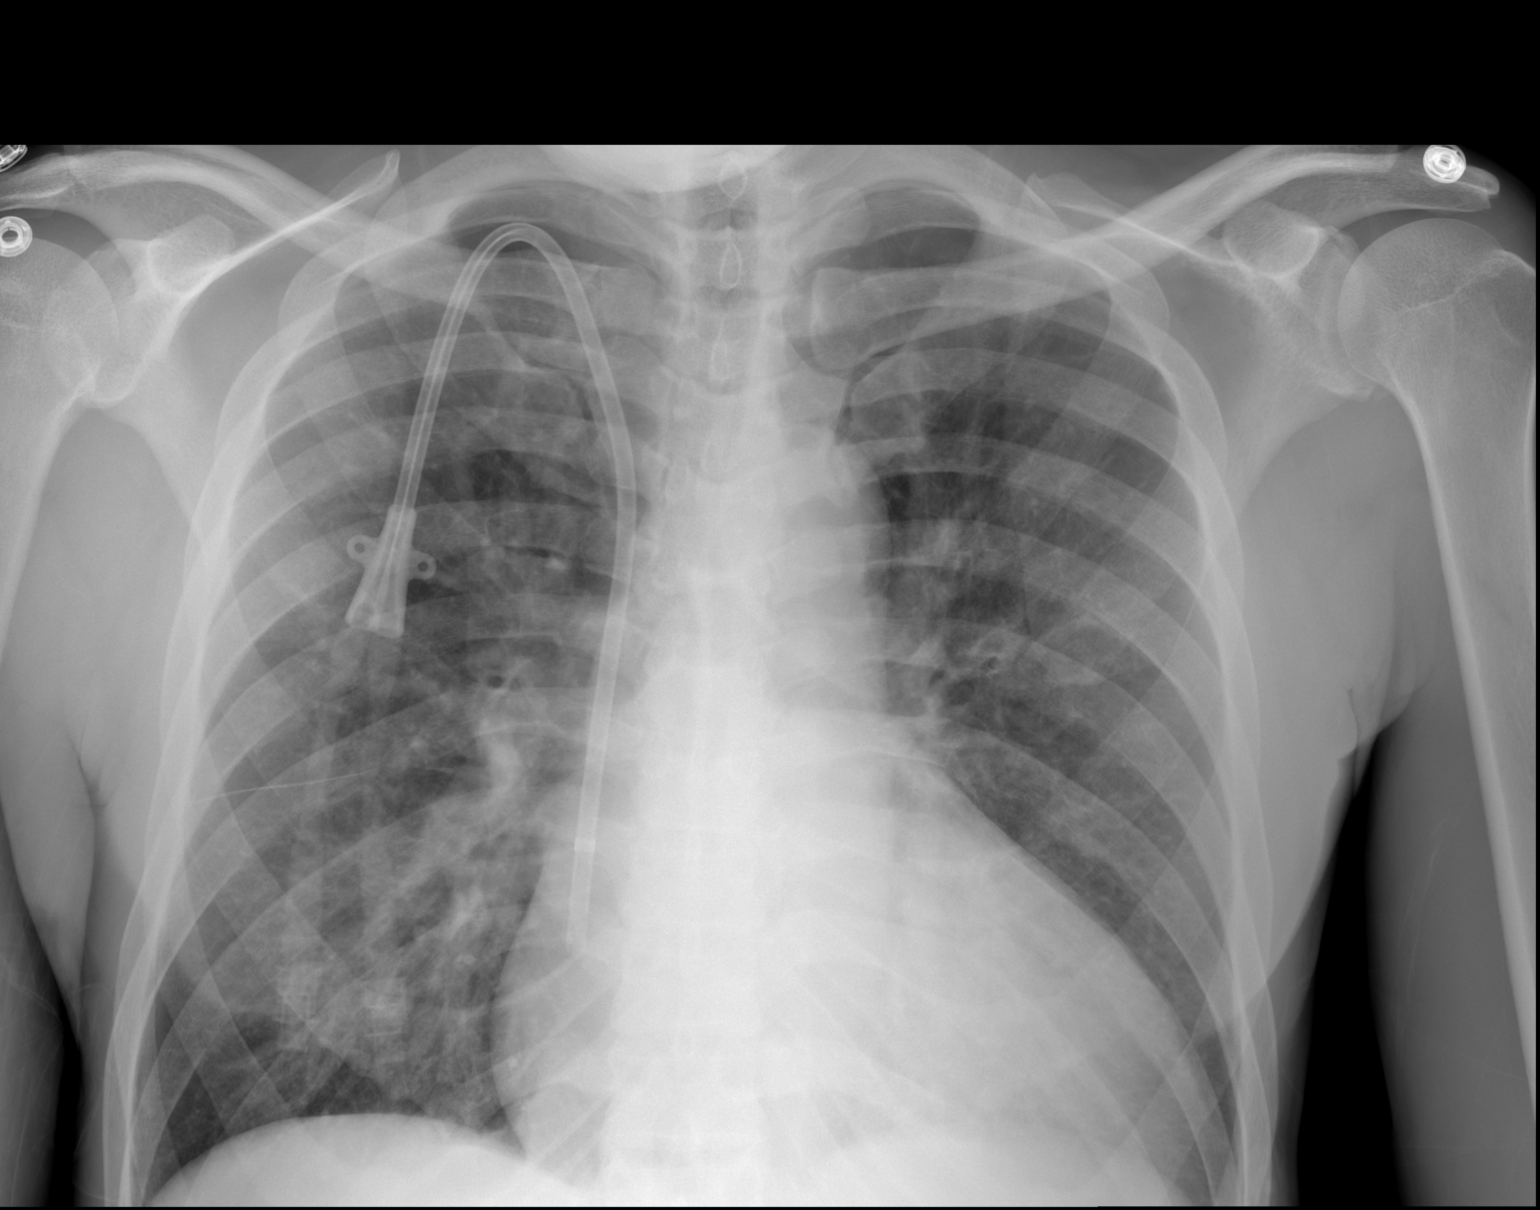

[2 of 2 positions shown; findings below may reference images not displayed]

FINDINGS: Unchanged appearance of right central venous catheter. The heart
size and mediastinal contours are within normal limits. Both lungs
are clear. The visualized skeletal structures are unremarkable.
IMPRESSION: No active cardiopulmonary disease.

## 2016-03-21 ENCOUNTER — Telehealth: Payer: Self-pay

## 2016-03-21 ENCOUNTER — Ambulatory Visit: Payer: Self-pay | Admitting: Internal Medicine

## 2016-03-21 DIAGNOSIS — N2581 Secondary hyperparathyroidism of renal origin: Secondary | ICD-10-CM | POA: Diagnosis not present

## 2016-03-21 DIAGNOSIS — Z992 Dependence on renal dialysis: Secondary | ICD-10-CM | POA: Diagnosis not present

## 2016-03-21 DIAGNOSIS — Z418 Encounter for other procedures for purposes other than remedying health state: Secondary | ICD-10-CM | POA: Diagnosis not present

## 2016-03-21 DIAGNOSIS — N186 End stage renal disease: Secondary | ICD-10-CM | POA: Diagnosis not present

## 2016-03-21 DIAGNOSIS — E8779 Other fluid overload: Secondary | ICD-10-CM | POA: Diagnosis not present

## 2016-03-21 NOTE — Telephone Encounter (Signed)
This Case Manager spoke with Dr. Doreene Burke who indicated patient missed his appointment with him today at 1400. Dr. Doreene Burke would like for appointment to rescheduled with patient if possible. Call placed to [phone] to discuss rescheduling appointment with Dr. Doreene Burke; unable to reach patient. HIPPA compliant voicemail left requesting return call.

## 2016-03-21 NOTE — Telephone Encounter (Signed)
Received return call from patient. Discussed need to reschedule appointment with Dr. Doreene Burke since patient missed his appointment with him today. Patient agreeable. Patient agreeable to appointment with Dr. Doreene Burke on 04/04/16 at 0900.  Patient reminded of clinic address.

## 2016-03-24 DIAGNOSIS — R1084 Generalized abdominal pain: Secondary | ICD-10-CM | POA: Diagnosis not present

## 2016-03-24 DIAGNOSIS — I4581 Long QT syndrome: Secondary | ICD-10-CM | POA: Diagnosis not present

## 2016-03-24 DIAGNOSIS — R140 Abdominal distension (gaseous): Secondary | ICD-10-CM | POA: Diagnosis not present

## 2016-03-24 DIAGNOSIS — Z86718 Personal history of other venous thrombosis and embolism: Secondary | ICD-10-CM | POA: Diagnosis not present

## 2016-03-24 DIAGNOSIS — Z86711 Personal history of pulmonary embolism: Secondary | ICD-10-CM | POA: Diagnosis not present

## 2016-03-24 DIAGNOSIS — G8929 Other chronic pain: Secondary | ICD-10-CM | POA: Diagnosis not present

## 2016-03-24 DIAGNOSIS — I517 Cardiomegaly: Secondary | ICD-10-CM | POA: Diagnosis not present

## 2016-03-24 DIAGNOSIS — Z87891 Personal history of nicotine dependence: Secondary | ICD-10-CM | POA: Diagnosis not present

## 2016-03-24 DIAGNOSIS — E8770 Fluid overload, unspecified: Secondary | ICD-10-CM | POA: Diagnosis not present

## 2016-03-24 DIAGNOSIS — R112 Nausea with vomiting, unspecified: Secondary | ICD-10-CM | POA: Diagnosis not present

## 2016-03-24 DIAGNOSIS — I132 Hypertensive heart and chronic kidney disease with heart failure and with stage 5 chronic kidney disease, or end stage renal disease: Secondary | ICD-10-CM | POA: Diagnosis not present

## 2016-03-24 DIAGNOSIS — N186 End stage renal disease: Secondary | ICD-10-CM | POA: Diagnosis not present

## 2016-03-24 DIAGNOSIS — E875 Hyperkalemia: Secondary | ICD-10-CM | POA: Diagnosis not present

## 2016-03-24 DIAGNOSIS — N2581 Secondary hyperparathyroidism of renal origin: Secondary | ICD-10-CM | POA: Diagnosis not present

## 2016-03-24 DIAGNOSIS — Z7902 Long term (current) use of antithrombotics/antiplatelets: Secondary | ICD-10-CM | POA: Diagnosis not present

## 2016-03-24 DIAGNOSIS — K219 Gastro-esophageal reflux disease without esophagitis: Secondary | ICD-10-CM | POA: Diagnosis not present

## 2016-03-24 DIAGNOSIS — Z886 Allergy status to analgesic agent status: Secondary | ICD-10-CM | POA: Diagnosis not present

## 2016-03-24 DIAGNOSIS — R0600 Dyspnea, unspecified: Secondary | ICD-10-CM | POA: Diagnosis not present

## 2016-03-24 DIAGNOSIS — R918 Other nonspecific abnormal finding of lung field: Secondary | ICD-10-CM | POA: Diagnosis not present

## 2016-03-24 DIAGNOSIS — Z992 Dependence on renal dialysis: Secondary | ICD-10-CM | POA: Diagnosis not present

## 2016-03-24 DIAGNOSIS — Z885 Allergy status to narcotic agent status: Secondary | ICD-10-CM | POA: Diagnosis not present

## 2016-03-24 DIAGNOSIS — R0602 Shortness of breath: Secondary | ICD-10-CM | POA: Diagnosis not present

## 2016-03-24 DIAGNOSIS — Z7901 Long term (current) use of anticoagulants: Secondary | ICD-10-CM | POA: Diagnosis not present

## 2016-03-24 DIAGNOSIS — Z79899 Other long term (current) drug therapy: Secondary | ICD-10-CM | POA: Diagnosis not present

## 2016-03-24 DIAGNOSIS — Z888 Allergy status to other drugs, medicaments and biological substances status: Secondary | ICD-10-CM | POA: Diagnosis not present

## 2016-03-24 DIAGNOSIS — I509 Heart failure, unspecified: Secondary | ICD-10-CM | POA: Diagnosis not present

## 2016-03-24 DIAGNOSIS — T8611 Kidney transplant rejection: Secondary | ICD-10-CM | POA: Diagnosis not present

## 2016-03-24 DIAGNOSIS — Z940 Kidney transplant status: Secondary | ICD-10-CM | POA: Diagnosis not present

## 2016-03-24 DIAGNOSIS — E1122 Type 2 diabetes mellitus with diabetic chronic kidney disease: Secondary | ICD-10-CM | POA: Diagnosis not present

## 2016-03-25 DIAGNOSIS — I517 Cardiomegaly: Secondary | ICD-10-CM | POA: Diagnosis not present

## 2016-03-25 DIAGNOSIS — I4581 Long QT syndrome: Secondary | ICD-10-CM | POA: Diagnosis not present

## 2016-03-26 DIAGNOSIS — N2581 Secondary hyperparathyroidism of renal origin: Secondary | ICD-10-CM | POA: Diagnosis not present

## 2016-03-26 DIAGNOSIS — Z418 Encounter for other procedures for purposes other than remedying health state: Secondary | ICD-10-CM | POA: Diagnosis not present

## 2016-03-26 DIAGNOSIS — D631 Anemia in chronic kidney disease: Secondary | ICD-10-CM | POA: Diagnosis not present

## 2016-03-26 DIAGNOSIS — Z7901 Long term (current) use of anticoagulants: Secondary | ICD-10-CM | POA: Diagnosis not present

## 2016-03-26 DIAGNOSIS — N186 End stage renal disease: Secondary | ICD-10-CM | POA: Diagnosis not present

## 2016-03-27 DIAGNOSIS — I4581 Long QT syndrome: Secondary | ICD-10-CM | POA: Diagnosis not present

## 2016-03-27 DIAGNOSIS — I444 Left anterior fascicular block: Secondary | ICD-10-CM | POA: Diagnosis not present

## 2016-03-27 DIAGNOSIS — M545 Low back pain: Secondary | ICD-10-CM | POA: Diagnosis not present

## 2016-03-27 DIAGNOSIS — I132 Hypertensive heart and chronic kidney disease with heart failure and with stage 5 chronic kidney disease, or end stage renal disease: Secondary | ICD-10-CM | POA: Diagnosis not present

## 2016-03-27 DIAGNOSIS — N186 End stage renal disease: Secondary | ICD-10-CM | POA: Diagnosis not present

## 2016-03-27 DIAGNOSIS — Z79899 Other long term (current) drug therapy: Secondary | ICD-10-CM | POA: Diagnosis not present

## 2016-03-27 DIAGNOSIS — I517 Cardiomegaly: Secondary | ICD-10-CM | POA: Diagnosis not present

## 2016-03-27 DIAGNOSIS — R0989 Other specified symptoms and signs involving the circulatory and respiratory systems: Secondary | ICD-10-CM | POA: Diagnosis not present

## 2016-03-27 DIAGNOSIS — Z888 Allergy status to other drugs, medicaments and biological substances status: Secondary | ICD-10-CM | POA: Diagnosis not present

## 2016-03-27 DIAGNOSIS — Z87891 Personal history of nicotine dependence: Secondary | ICD-10-CM | POA: Diagnosis not present

## 2016-03-27 DIAGNOSIS — Z7901 Long term (current) use of anticoagulants: Secondary | ICD-10-CM | POA: Diagnosis not present

## 2016-03-27 DIAGNOSIS — I509 Heart failure, unspecified: Secondary | ICD-10-CM | POA: Diagnosis not present

## 2016-03-27 DIAGNOSIS — I120 Hypertensive chronic kidney disease with stage 5 chronic kidney disease or end stage renal disease: Secondary | ICD-10-CM | POA: Diagnosis not present

## 2016-03-27 DIAGNOSIS — M25511 Pain in right shoulder: Secondary | ICD-10-CM | POA: Diagnosis not present

## 2016-03-27 DIAGNOSIS — R0602 Shortness of breath: Secondary | ICD-10-CM | POA: Diagnosis not present

## 2016-03-27 DIAGNOSIS — C9590 Leukemia, unspecified not having achieved remission: Secondary | ICD-10-CM | POA: Diagnosis not present

## 2016-03-27 DIAGNOSIS — Z992 Dependence on renal dialysis: Secondary | ICD-10-CM | POA: Diagnosis not present

## 2016-03-27 DIAGNOSIS — M47812 Spondylosis without myelopathy or radiculopathy, cervical region: Secondary | ICD-10-CM | POA: Diagnosis not present

## 2016-03-27 DIAGNOSIS — Z940 Kidney transplant status: Secondary | ICD-10-CM | POA: Diagnosis not present

## 2016-03-27 DIAGNOSIS — M549 Dorsalgia, unspecified: Secondary | ICD-10-CM | POA: Diagnosis not present

## 2016-03-27 DIAGNOSIS — E1122 Type 2 diabetes mellitus with diabetic chronic kidney disease: Secondary | ICD-10-CM | POA: Diagnosis not present

## 2016-03-28 DIAGNOSIS — I517 Cardiomegaly: Secondary | ICD-10-CM | POA: Diagnosis not present

## 2016-03-28 DIAGNOSIS — G894 Chronic pain syndrome: Secondary | ICD-10-CM | POA: Diagnosis not present

## 2016-03-28 DIAGNOSIS — I509 Heart failure, unspecified: Secondary | ICD-10-CM | POA: Diagnosis not present

## 2016-03-28 DIAGNOSIS — N186 End stage renal disease: Secondary | ICD-10-CM | POA: Diagnosis not present

## 2016-03-28 DIAGNOSIS — I444 Left anterior fascicular block: Secondary | ICD-10-CM | POA: Diagnosis not present

## 2016-03-28 DIAGNOSIS — E875 Hyperkalemia: Secondary | ICD-10-CM | POA: Diagnosis not present

## 2016-03-28 DIAGNOSIS — R109 Unspecified abdominal pain: Secondary | ICD-10-CM | POA: Diagnosis not present

## 2016-03-28 DIAGNOSIS — I4581 Long QT syndrome: Secondary | ICD-10-CM | POA: Diagnosis not present

## 2016-03-28 DIAGNOSIS — Z79899 Other long term (current) drug therapy: Secondary | ICD-10-CM | POA: Diagnosis not present

## 2016-03-28 DIAGNOSIS — N2889 Other specified disorders of kidney and ureter: Secondary | ICD-10-CM | POA: Diagnosis not present

## 2016-03-28 DIAGNOSIS — Z992 Dependence on renal dialysis: Secondary | ICD-10-CM | POA: Diagnosis not present

## 2016-03-28 DIAGNOSIS — I132 Hypertensive heart and chronic kidney disease with heart failure and with stage 5 chronic kidney disease, or end stage renal disease: Secondary | ICD-10-CM | POA: Diagnosis not present

## 2016-03-30 DIAGNOSIS — N186 End stage renal disease: Secondary | ICD-10-CM | POA: Diagnosis not present

## 2016-03-30 DIAGNOSIS — Z418 Encounter for other procedures for purposes other than remedying health state: Secondary | ICD-10-CM | POA: Diagnosis not present

## 2016-03-30 DIAGNOSIS — N2581 Secondary hyperparathyroidism of renal origin: Secondary | ICD-10-CM | POA: Diagnosis not present

## 2016-03-30 DIAGNOSIS — D631 Anemia in chronic kidney disease: Secondary | ICD-10-CM | POA: Diagnosis not present

## 2016-03-30 DIAGNOSIS — E1139 Type 2 diabetes mellitus with other diabetic ophthalmic complication: Secondary | ICD-10-CM | POA: Diagnosis not present

## 2016-03-31 ENCOUNTER — Emergency Department (HOSPITAL_COMMUNITY): Payer: Medicare Other

## 2016-03-31 ENCOUNTER — Inpatient Hospital Stay (HOSPITAL_COMMUNITY)
Admission: EM | Admit: 2016-03-31 | Discharge: 2016-04-01 | DRG: 640 | Disposition: A | Discharge status: Home / Self Care | Payer: Medicare Other | Attending: Family Medicine | Admitting: Family Medicine

## 2016-03-31 ENCOUNTER — Encounter (HOSPITAL_COMMUNITY): Payer: Self-pay | Admitting: Emergency Medicine

## 2016-03-31 DIAGNOSIS — Z7952 Long term (current) use of systemic steroids: Secondary | ICD-10-CM | POA: Diagnosis not present

## 2016-03-31 DIAGNOSIS — F1910 Other psychoactive substance abuse, uncomplicated: Secondary | ICD-10-CM | POA: Diagnosis not present

## 2016-03-31 DIAGNOSIS — E785 Hyperlipidemia, unspecified: Secondary | ICD-10-CM | POA: Diagnosis present

## 2016-03-31 DIAGNOSIS — I428 Other cardiomyopathies: Secondary | ICD-10-CM | POA: Diagnosis present

## 2016-03-31 DIAGNOSIS — N186 End stage renal disease: Secondary | ICD-10-CM | POA: Diagnosis present

## 2016-03-31 DIAGNOSIS — Z79899 Other long term (current) drug therapy: Secondary | ICD-10-CM

## 2016-03-31 DIAGNOSIS — I2782 Chronic pulmonary embolism: Secondary | ICD-10-CM | POA: Diagnosis present

## 2016-03-31 DIAGNOSIS — Z87891 Personal history of nicotine dependence: Secondary | ICD-10-CM

## 2016-03-31 DIAGNOSIS — E875 Hyperkalemia: Secondary | ICD-10-CM | POA: Diagnosis not present

## 2016-03-31 DIAGNOSIS — R079 Chest pain, unspecified: Secondary | ICD-10-CM | POA: Diagnosis not present

## 2016-03-31 DIAGNOSIS — F419 Anxiety disorder, unspecified: Secondary | ICD-10-CM | POA: Diagnosis present

## 2016-03-31 DIAGNOSIS — Z7901 Long term (current) use of anticoagulants: Secondary | ICD-10-CM

## 2016-03-31 DIAGNOSIS — R109 Unspecified abdominal pain: Secondary | ICD-10-CM | POA: Diagnosis present

## 2016-03-31 DIAGNOSIS — Z765 Malingerer [conscious simulation]: Secondary | ICD-10-CM | POA: Diagnosis not present

## 2016-03-31 DIAGNOSIS — D631 Anemia in chronic kidney disease: Secondary | ICD-10-CM | POA: Diagnosis present

## 2016-03-31 DIAGNOSIS — E1122 Type 2 diabetes mellitus with diabetic chronic kidney disease: Secondary | ICD-10-CM | POA: Diagnosis present

## 2016-03-31 DIAGNOSIS — F329 Major depressive disorder, single episode, unspecified: Secondary | ICD-10-CM | POA: Diagnosis present

## 2016-03-31 DIAGNOSIS — N2889 Other specified disorders of kidney and ureter: Secondary | ICD-10-CM | POA: Diagnosis present

## 2016-03-31 DIAGNOSIS — I5042 Chronic combined systolic (congestive) and diastolic (congestive) heart failure: Secondary | ICD-10-CM | POA: Diagnosis present

## 2016-03-31 DIAGNOSIS — Z888 Allergy status to other drugs, medicaments and biological substances status: Secondary | ICD-10-CM

## 2016-03-31 DIAGNOSIS — G8929 Other chronic pain: Secondary | ICD-10-CM | POA: Diagnosis present

## 2016-03-31 DIAGNOSIS — Z992 Dependence on renal dialysis: Secondary | ICD-10-CM | POA: Diagnosis not present

## 2016-03-31 DIAGNOSIS — R001 Bradycardia, unspecified: Secondary | ICD-10-CM | POA: Diagnosis not present

## 2016-03-31 DIAGNOSIS — Z8249 Family history of ischemic heart disease and other diseases of the circulatory system: Secondary | ICD-10-CM

## 2016-03-31 DIAGNOSIS — I132 Hypertensive heart and chronic kidney disease with heart failure and with stage 5 chronic kidney disease, or end stage renal disease: Secondary | ICD-10-CM | POA: Diagnosis present

## 2016-03-31 DIAGNOSIS — Z86718 Personal history of other venous thrombosis and embolism: Secondary | ICD-10-CM | POA: Diagnosis not present

## 2016-03-31 LAB — CBC
HCT: 28.8 % — ABNORMAL LOW (ref 39.0–52.0)
Hemoglobin: 8.7 g/dL — ABNORMAL LOW (ref 13.0–17.0)
MCH: 27.2 pg (ref 26.0–34.0)
MCHC: 30.2 g/dL (ref 30.0–36.0)
MCV: 90 fL (ref 78.0–100.0)
PLATELETS: 199 10*3/uL (ref 150–400)
RBC: 3.2 MIL/uL — ABNORMAL LOW (ref 4.22–5.81)
RDW: 17.3 % — AB (ref 11.5–15.5)
WBC: 7.3 10*3/uL (ref 4.0–10.5)

## 2016-03-31 LAB — BASIC METABOLIC PANEL
Anion gap: 15 (ref 5–15)
BUN: 59 mg/dL — AB (ref 6–20)
CALCIUM: 8.8 mg/dL — AB (ref 8.9–10.3)
CO2: 16 mmol/L — ABNORMAL LOW (ref 22–32)
CREATININE: 10.77 mg/dL — AB (ref 0.61–1.24)
Chloride: 103 mmol/L (ref 101–111)
GFR calc Af Amer: 6 mL/min — ABNORMAL LOW (ref >60)
GFR, EST NON AFRICAN AMERICAN: 5 mL/min — AB (ref >60)
GLUCOSE: 101 mg/dL — AB (ref 65–99)
Potassium: >7.5 mmol/L (ref 3.5–5.1)
Sodium: 134 mmol/L — ABNORMAL LOW (ref 135–145)

## 2016-03-31 LAB — TROPONIN I: TROPONIN I: 0.03 ng/mL — AB (ref <0.03)

## 2016-03-31 LAB — I-STAT CHEM 8, ED
BUN: 61 mg/dL — ABNORMAL HIGH (ref 6–20)
CREATININE: 11.3 mg/dL — AB (ref 0.61–1.24)
Calcium, Ion: 1.03 mmol/L — ABNORMAL LOW (ref 1.15–1.40)
Chloride: 104 mmol/L (ref 101–111)
GLUCOSE: 115 mg/dL — AB (ref 65–99)
HCT: 29 % — ABNORMAL LOW (ref 39.0–52.0)
HEMOGLOBIN: 9.9 g/dL — AB (ref 13.0–17.0)
Potassium: 8.4 mmol/L (ref 3.5–5.1)
SODIUM: 135 mmol/L (ref 135–145)
TCO2: 24 mmol/L (ref 0–100)

## 2016-03-31 LAB — PROTIME-INR
INR: 1.21
Prothrombin Time: 15.4 seconds — ABNORMAL HIGH (ref 11.4–15.2)

## 2016-03-31 LAB — TYPE AND SCREEN
ABO/RH(D): B POS
ANTIBODY SCREEN: NEGATIVE

## 2016-03-31 LAB — I-STAT TROPONIN, ED: TROPONIN I, POC: 0.06 ng/mL (ref 0.00–0.08)

## 2016-03-31 MED ORDER — CALCIUM CHLORIDE 10 % IV SOLN
INTRAVENOUS | Status: AC
  Filled 2016-03-31: qty 10

## 2016-03-31 MED ORDER — ALLOPURINOL 100 MG PO TABS
100.0000 mg | ORAL_TABLET | Freq: Every day | ORAL | Status: DC
  Administered 2016-04-01: 100 mg via ORAL
  Filled 2016-03-31: qty 1

## 2016-03-31 MED ORDER — ALBUTEROL SULFATE (2.5 MG/3ML) 0.083% IN NEBU
5.0000 mg | INHALATION_SOLUTION | Freq: Once | RESPIRATORY_TRACT | Status: AC
  Administered 2016-03-31: 5 mg via RESPIRATORY_TRACT
  Filled 2016-03-31: qty 6

## 2016-03-31 MED ORDER — DEXTROSE 50 % IV SOLN
25.0000 g | Freq: Once | INTRAVENOUS | Status: AC
  Administered 2016-03-31: 25 g via INTRAVENOUS
  Filled 2016-03-31: qty 50

## 2016-03-31 MED ORDER — PROMETHAZINE HCL 25 MG PO TABS
25.0000 mg | ORAL_TABLET | Freq: Three times a day (TID) | ORAL | Status: DC | PRN
  Administered 2016-04-01: 25 mg via ORAL
  Filled 2016-03-31: qty 1

## 2016-03-31 MED ORDER — FERRIC CITRATE 1 GM 210 MG(FE) PO TABS: 420.0000 mg | ORAL_TABLET | Freq: Three times a day (TID) | ORAL | Status: DC

## 2016-03-31 MED ORDER — SODIUM BICARBONATE 8.4 % IV SOLN
50.0000 meq | Freq: Once | INTRAVENOUS | Status: AC
  Administered 2016-03-31: 50 meq via INTRAVENOUS
  Filled 2016-03-31: qty 50

## 2016-03-31 MED ORDER — INSULIN ASPART 100 UNIT/ML ~~LOC~~ SOLN
6.0000 [IU] | Freq: Once | SUBCUTANEOUS | Status: AC
  Administered 2016-03-31: 6 [IU] via INTRAVENOUS
  Filled 2016-03-31: qty 1

## 2016-03-31 MED ORDER — SEVELAMER CARBONATE 800 MG PO TABS
1600.0000 mg | ORAL_TABLET | Freq: Three times a day (TID) | ORAL | Status: DC
  Administered 2016-04-01: 1600 mg via ORAL
  Filled 2016-03-31: qty 2

## 2016-03-31 MED ORDER — SODIUM CHLORIDE 0.9 % IV SOLN
INTRAVENOUS | Status: DC
  Administered 2016-03-31: 23:00:00 via INTRAVENOUS

## 2016-03-31 MED ORDER — CALCIUM CHLORIDE 10 % IV SOLN
1.0000 g | Freq: Once | INTRAVENOUS | Status: AC
  Administered 2016-03-31: 1 g via INTRAVENOUS

## 2016-03-31 MED ORDER — FAMOTIDINE 20 MG PO TABS
20.0000 mg | ORAL_TABLET | Freq: Two times a day (BID) | ORAL | Status: DC
  Administered 2016-04-01: 20 mg via ORAL
  Filled 2016-03-31: qty 1

## 2016-03-31 MED ORDER — ATORVASTATIN CALCIUM 40 MG PO TABS: 40.0000 mg | ORAL_TABLET | Freq: Every day | ORAL | Status: DC

## 2016-03-31 NOTE — ED Notes (Signed)
Unsuccessful attempt at IV by Kerrie Pleasure in left upper arm.

## 2016-03-31 NOTE — ED Provider Notes (Signed)
[facility] [place] DEPT Provider Note   CSN: [id number] Arrival date & time: 03/31/16  2205     History   Chief Complaint Chief Complaint  Patient presents with  . Chest Pain  . Bradycardia    HPI Frank [name] is a 50 y.o. male.  HPI  Is a 50 year old male who presents to the emergency department via EMS with chest pain radiating into his back for 2 days. Pain worsened while driving roughly 40 min PTA with associated shortness of breath. Pain is constant, severe and EMS did not give him anything for pain. EMS stated that his pulse was roughly 50 when they picked him up and decreased into the low 30's upon arrival to the ED. Pt denies other complaints. He states he went to Dialysis yesterday and stayed for the entire treatment. Patient also endorses intermittent subjective fevers, 1 episode of emesis, and mild abdominal pain. Patient denies blood in his stool, diarrhea, headache, visual changes, numbness/timing, weakness.  Past Medical History:  Diagnosis Date  . Anemia   . Anxiety   . Chronic combined systolic and diastolic CHF (congestive heart failure) (HCC)    a. EF 20-25% by echo in 08/2015 b. echo 10/2015: EF 35-40%, diffuse HK, severe LAE, moderate RAE, small pericardial effusion  . Complication of anesthesia    itching, sore throat  . Depression   . Dialysis patient ([place])   . ESRD (end stage renal disease) ([place])    due to HTN per patient, followed at [facility], s/p failed kidney transplant - dialysis Tue, Th, Sat  . Hyperkalemia 12/2015  . Hypertension   . Junctional rhythm    a. noted in 08/2015: hyperkalemic at that time  b. 12/2015: presented in junctional rhythm w/ K+ of 6.6. Resolved with improvement of K+ levels.  . Nonischemic cardiomyopathy ([place])    a. 08/2014: cath showing minimal CAD, but tortuous arteries noted.   . Renal insufficiency   . Shortness of breath   . Type II diabetes mellitus (HCC)    No history per patient, but remains under history as A1c would  not be accurate given on dialysis    Patient Active Problem List   Diagnosis Date Noted  . Bradycardia 03/31/2016  . Uremia 03/11/2016  . Nonischemic cardiomyopathy ([place]) 01/09/2016  . Chronic abdominal pain   . ESRD (end stage renal disease) on dialysis ([place])   . SOB (shortness of breath)   . Generalized abdominal pain   . Faintness   . Dyspnea 01/07/2016  . Hyperkalemia 12/16/2015  . Bilateral low back pain without sciatica   . Pericardial effusion 10/30/2015  . Left renal mass 10/30/2015  . Constipation 10/30/2015  . Hypertensive urgency 09/08/2015  . Adjustment disorder with mixed anxiety and depressed mood 08/20/2015  . Chronic epididymitis 06/19/2015  . Groin pain, chronic, right 06/19/2015  . Incarcerated right inguinal hernia 02/16/2015  . Malnutrition of moderate degree ([place]) 02/03/2015  . Left upper extremity deep vein thrombosis ([place]) 02/01/2015  . Drug-seeking behavior 02/01/2015  . Anemia of chronic disease   . Dyslipidemia   . Chronic pulmonary embolism ([place])   . History of noncompliance with medical treatment   . Chronic combined systolic and diastolic CHF (congestive heart failure) ([place])   . ESRD on dialysis- Tues, Thurs, Sat in [place], nephro: [name] 04/13/2013    Past Surgical History:  Procedure Laterality Date  . CAPD INSERTION    . CAPD REMOVAL    . INGUINAL HERNIA REPAIR Right  02/14/2015   Procedure: REPAIR INCARCERATED RIGHT INGUINAL HERNIA;  Surgeon: [name], MD;  Location: [facility];  Service: General;  Laterality: Right;  . INSERTION OF DIALYSIS CATHETER Right 09/23/2015   Procedure: exchange of Right internal Dialysis Catheter.;  Surgeon: [name], MD;  Location: [facility];  Service: Vascular;  Laterality: Right;  . KIDNEY RECEIPIENT  2006   failed and started HD in March 2014  . LEFT HEART CATHETERIZATION WITH CORONARY ANGIOGRAM N/A 09/02/2014   Procedure: LEFT HEART CATHETERIZATION WITH CORONARY ANGIOGRAM;  Surgeon: [name], MD;   Location: [facility] CATH LAB;  Service: Cardiovascular;  Laterality: N/A;       Home Medications    Prior to Admission medications   Medication Sig Start Date End Date Taking? Authorizing Provider  allopurinol (ZYLOPRIM) 100 MG tablet Take 100 mg by mouth daily.    Historical Provider, MD  amLODipine (NORVASC) 10 MG tablet Take 10 mg by mouth daily.    Historical Provider, MD  atorvastatin (LIPITOR) 40 MG tablet Take 1 tablet (40 mg total) by mouth daily at 6 PM. 09/02/14   Barton Dubois, MD  AURYXIA 1 GM 210 MG(Fe) TABS Take 420 mg by mouth 3 (three) times daily. 01/26/16   Historical Provider, MD  carvedilol (COREG) 25 MG tablet Take 25 mg by mouth 2 (two) times daily with a meal.    Historical Provider, MD  cinacalcet (SENSIPAR) 30 MG tablet Take 30 mg by mouth daily.    Historical Provider, MD  cloNIDine (CATAPRES - DOSED IN MG/24 HR) 0.3 mg/24hr patch Place 0.3 mg onto the skin once a week.    Historical Provider, MD  doxazosin (CARDURA) 2 MG tablet Take 2 mg by mouth daily.    Historical Provider, MD  famotidine (PEPCID) 20 MG tablet Take 1 tablet (20 mg total) by mouth 2 (two) times daily. 02/13/16   Charlesetta Shanks, MD  gabapentin (NEURONTIN) 100 MG capsule Take 100 mg by mouth 3 (three) times daily.    Historical Provider, MD  hydrALAZINE (APRESOLINE) 100 MG tablet Take 100 mg by mouth every 8 (eight) hours.    Historical Provider, MD  isosorbide mononitrate (IMDUR) 60 MG 24 hr tablet Take 1 tablet (60 mg total) by mouth daily. 01/09/16   Smiley Houseman, MD  omeprazole (PRILOSEC) 20 MG capsule Take 20 mg by mouth daily. 07/01/13   Historical Provider, MD  ondansetron (ZOFRAN ODT) 4 MG disintegrating tablet 6m ODT q4 hours prn nausea/vomit 02/13/16   MCharlesetta Shanks MD  predniSONE (DELTASONE) 10 MG tablet Take 10 mg by mouth daily with breakfast.    Historical Provider, MD  promethazine (PHENERGAN) 25 MG tablet Take 1 tablet (25 mg total) by mouth every 8 (eight) hours as needed for nausea  or vomiting. 02/11/16   CDalia Heading PA-C  sevelamer carbonate (RENVELA) 800 MG tablet Take 800-1,600 mg by mouth 3 (three) times daily with meals. 2 tabs three times daily with meals, and 1 tablet with snacks    Historical Provider, MD  warfarin (COUMADIN) 2 MG tablet Take 2 mg by mouth daily.  01/31/16   Historical Provider, MD  warfarin (COUMADIN) 5 MG tablet  01/31/16   Historical Provider, MD  warfarin (COUMADIN) 6 MG tablet Take 6 mg by mouth. 02/28/16   Historical Provider, MD    Family History Family History  Problem Relation Age of Onset  . Hypertension Other     Social History Social History  Substance Use Topics  . Smoking  status: Former Smoker    Packs/day: 0.00    Years: 1.00    Types: Cigarettes  . Smokeless tobacco: Never Used     Comment: quit Jan 2014  . Alcohol use No     Allergies   Butalbital-apap-caffeine; Ferrlecit [na ferric gluc cplx in sucrose]; Minoxidil; Darvocet [propoxyphene n-acetaminophen]; and Other   Review of Systems Review of Systems  Constitutional: Positive for chills and fever.  HENT: Negative for trouble swallowing.   Eyes: Negative for visual disturbance.  Respiratory: Positive for shortness of breath. Negative for chest tightness.   Cardiovascular: Positive for chest pain. Negative for leg swelling.  Gastrointestinal: Positive for abdominal pain and vomiting. Negative for abdominal distention, blood in stool and diarrhea.  Genitourinary: Negative for flank pain.  Musculoskeletal: Negative for neck pain.  Skin: Negative for rash.  Neurological: Negative for dizziness, seizures, speech difficulty, weakness and headaches.     Physical Exam Updated Vital Signs BP 111/87   Resp 21   Ht _0  (1.88 m)   Wt 74.4 kg   SpO2 100%   BMI 21.06 kg/m   Physical Exam  Constitutional: He appears well-developed and well-nourished. He appears ill. He appears distressed.  HENT:  Head: Normocephalic and atraumatic.  Eyes: Conjunctivae  are normal.  Neck: Normal range of motion. Neck supple.  Cardiovascular: Regular rhythm.  Bradycardia present.  Exam reveals no gallop and no friction rub.   No murmur heard. Pulses:      Radial pulses are 2+ on the right side, and 2+ on the left side.  Pulmonary/Chest: Effort normal and breath sounds normal. No respiratory distress. He has no decreased breath sounds. He has no wheezes. He has no rhonchi. He has no rales.  Abdominal: Soft. Normal appearance. He exhibits no distension. There is generalized tenderness. There is no rigidity and no guarding.  Musculoskeletal: Normal range of motion.  Neurological: He is alert. Coordination normal.  Skin: Skin is warm and dry. He is not diaphoretic.  Psychiatric: He has a normal mood and affect. His behavior is normal.  Nursing note and vitals reviewed.    ED Treatments / Results  Labs (all labs ordered are listed, but only abnormal results are displayed) Labs Reviewed  CBC - Abnormal; Notable for the following:       Result Value   RBC 3.20 (*)    Hemoglobin 8.7 (*)    HCT 28.8 (*)    RDW 17.3 (*)    All other components within normal limits  BASIC METABOLIC PANEL - Abnormal; Notable for the following:    Sodium 134 (*)    Potassium >7.5 (*)    CO2 16 (*)    Glucose, Bld 101 (*)    BUN 59 (*)    Creatinine, Ser 10.77 (*)    Calcium 8.8 (*)    GFR calc non Af Amer 5 (*)    GFR calc Af Amer 6 (*)    All other components within normal limits  PROTIME-INR - Abnormal; Notable for the following:    Prothrombin Time 15.4 (*)    All other components within normal limits  TROPONIN I - Abnormal; Notable for the following:    Troponin I 0.03 (*)    All other components within normal limits  I-STAT CHEM 8, ED - Abnormal; Notable for the following:    Potassium 8.4 (*)    BUN 61 (*)    Creatinine, Ser 11.30 (*)    Glucose, Bld 115 (*)  Calcium, Ion 1.03 (*)    Hemoglobin 9.9 (*)    HCT 29.0 (*)    All other components within  normal limits  I-STAT TROPOININ, ED  I-STAT CHEM 8, ED  TYPE AND SCREEN    EKG  EKG Interpretation  Date/Time:  Sunday March 31 2016 22:09:44 EDT Ventricular Rate:  34 PR Interval:    QRS Duration: 114 QT Interval:  568 QTC Calculation: 428 R Axis:   -48 Text Interpretation:  Sinus bradycardia Prolonged PR interval Nonspecific ST abnormality Incomplete left bundle branch block Left ventricular hypertrophy `qrs wider than previous, with more prominent t waves, ?hyperkalemia Confirmed by Ashok Cordia  MD, Lennette Bihari ([id number]) on 03/31/2016 10:20:24 PM       Radiology Dg Chest Port 1 View  Result Date: 03/31/2016 CLINICAL DATA:  Chest pain radiating to the back.  Bradycardia. EXAM: PORTABLE CHEST 1 VIEW COMPARISON:  03/11/2016 FINDINGS: Right jugular central line with tip at the cavoatrial junction. Unchanged moderate cardiomegaly. No dense consolidation.  No large effusion. IMPRESSION: Limited study. No dense consolidation or large effusion. Unchanged cardiomegaly. Electronically Signed   By: Andreas Newport M.D.   On: 03/31/2016 23:35    Procedures Procedures (including critical care time)  Medications Ordered in ED Medications  0.9 %  sodium chloride infusion ( Intravenous New Bag/Given 03/31/16 2238)  sevelamer carbonate (RENVELA) tablet 800-1,600 mg (not administered)  promethazine (PHENERGAN) tablet 25 mg (not administered)  allopurinol (ZYLOPRIM) tablet 100 mg (not administered)  atorvastatin (LIPITOR) tablet 40 mg (not administered)  Ferric Citrate TABS 420 mg (not administered)  famotidine (PEPCID) tablet 20 mg (not administered)  calcium chloride injection 1 g (1 g Intravenous Given 03/31/16 2233)  sodium bicarbonate injection 50 mEq (50 mEq Intravenous Given 03/31/16 2237)  dextrose 50 % solution 25 g (25 g Intravenous Given 03/31/16 2237)  insulin aspart (novoLOG) injection 6 Units (6 Units Intravenous Given 03/31/16 2236)  albuterol (PROVENTIL) (2.5 MG/3ML) 0.083% nebulizer  solution 5 mg (5 mg Nebulization Given 03/31/16 2242)     Initial Impression / Assessment and Plan / ED Course  I have reviewed the triage vital signs and the nursing notes.  Pertinent labs & imaging results that were available during my care of the patient were reviewed by me and considered in my medical decision making (see chart for details).  Clinical Course   Patient presents via EMS with bradycardia and hyperkalemia. Patient states he went to dialysis yesterday. Patient was given calcium chloride, dextrose, sodium bicarbonate, insulin, fluid in the ED and his bradycardia improved. Patient will need to be dialyzed. Nephrology was consulted and Dr. Jonnie Finner spoke with Dr. Ashok Cordia and stated the pt will be dialyzed. Hospitalist team was consulted and Dr. Fabio Neighbors will admit the patient.  Thank you Dr. Melvia Heaps and Dr. Alcario Drought for you time, consult and care of this pt.   Patient case discussed the patient seen by Dr. Delane Ginger who agrees with the above plan.  Final Clinical Impressions(s) / ED Diagnoses   Final diagnoses:  Bradycardia  Hyperkalemia    New Prescriptions New Prescriptions   No medications on file     Kalman Drape, [place] 04/01/16 0014    Lajean Saver, MD 04/01/16 1501

## 2016-03-31 NOTE — ED Triage Notes (Signed)
Brought by EMS with c/o central chest pain that radiates to the back.  When EMS arrived HR was in 50's.  Now in 30's.  [id number]

## 2016-03-31 NOTE — ED Provider Notes (Signed)
[facility] DEPT Provider Note   CSN: [id number] Arrival date & time: 03/31/16  2205     History   Chief Complaint Chief Complaint  Patient presents with  . Chest Pain  . Bradycardia    HPI Frank [name] is a 50 y.o. male.  Patient with hx ESRD on HD, T/TH/Sat, hx non compliance with dialysis, c/o last HD 3 days ago (Thursday), and that he has chest pain, and back pain for the past couple of days. Pain moderate, constant, dull, at rest, not pleuritic. States hx same pain, patient unsure of cause.  Patient is difficult historian, esp as relates his dialysis treatment/center.  Patient denies sob. No abd pain. No vomiting or diarrhea. No fever or chills. Indicates is compliant w normal meds.    The history is provided by the patient.  Chest Pain   Associated symptoms include back pain. Pertinent negatives include no abdominal pain, no cough, no fever, no headaches, no numbness, no shortness of breath and no weakness.    Past Medical History:  Diagnosis Date  . Anemia   . Anxiety   . Chronic combined systolic and diastolic CHF (congestive heart failure) (HCC)    a. EF 20-25% by echo in 08/2015 b. echo 10/2015: EF 35-40%, diffuse HK, severe LAE, moderate RAE, small pericardial effusion  . Complication of anesthesia    itching, sore throat  . Depression   . Dialysis patient ([place])   . ESRD (end stage renal disease) ([place])    due to HTN per patient, followed at [facility], s/p failed kidney transplant - dialysis Tue, Th, Sat  . Hyperkalemia 12/2015  . Hypertension   . Junctional rhythm    a. noted in 08/2015: hyperkalemic at that time  b. 12/2015: presented in junctional rhythm w/ K+ of 6.6. Resolved with improvement of K+ levels.  . Nonischemic cardiomyopathy ([place])    a. 08/2014: cath showing minimal CAD, but tortuous arteries noted.   . Renal insufficiency   . Shortness of breath   . Type II diabetes mellitus (HCC)    No history per patient, but remains under history as A1c  would not be accurate given on dialysis    Patient Active Problem List   Diagnosis Date Noted  . Uremia 03/11/2016  . Nonischemic cardiomyopathy ([place]) 01/09/2016  . Chronic abdominal pain   . ESRD (end stage renal disease) on dialysis ([place])   . SOB (shortness of breath)   . Generalized abdominal pain   . Faintness   . Dyspnea 01/07/2016  . Hyperkalemia 12/16/2015  . Bilateral low back pain without sciatica   . Pericardial effusion 10/30/2015  . Left renal mass 10/30/2015  . Constipation 10/30/2015  . Hypertensive urgency 09/08/2015  . Adjustment disorder with mixed anxiety and depressed mood 08/20/2015  . Chronic epididymitis 06/19/2015  . Groin pain, chronic, right 06/19/2015  . Incarcerated right inguinal hernia 02/16/2015  . Malnutrition of moderate degree ([place]) 02/03/2015  . Left upper extremity deep vein thrombosis ([place]) 02/01/2015  . Drug-seeking behavior 02/01/2015  . Anemia of chronic disease   . Dyslipidemia   . Chronic pulmonary embolism ([place])   . History of noncompliance with medical treatment   . Chronic combined systolic and diastolic CHF (congestive heart failure) ([place])   . ESRD on dialysis- Tues, Thurs, Sat in Home, nephro: [name] 04/13/2013    Past Surgical History:  Procedure Laterality Date  . CAPD INSERTION    . CAPD REMOVAL    . INGUINAL HERNIA  REPAIR Right 02/14/2015   Procedure: REPAIR INCARCERATED RIGHT INGUINAL HERNIA;  Surgeon: [name], MD;  Location: [place];  Service: General;  Laterality: Right;  . INSERTION OF DIALYSIS CATHETER Right 09/23/2015   Procedure: exchange of Right internal Dialysis Catheter.;  Surgeon: [name], MD;  Location: [place];  Service: Vascular;  Laterality: Right;  . KIDNEY RECEIPIENT  2006   failed and started HD in March 2014  . LEFT HEART CATHETERIZATION WITH CORONARY ANGIOGRAM N/A 09/02/2014   Procedure: LEFT HEART CATHETERIZATION WITH CORONARY ANGIOGRAM;  Surgeon: [name], MD;  Location: [facility] CATH  LAB;  Service: Cardiovascular;  Laterality: N/A;       Home Medications    Prior to Admission medications   Medication Sig Start Date End Date Taking? Authorizing Provider  allopurinol (ZYLOPRIM) 100 MG tablet Take 100 mg by mouth daily.    Historical Provider, MD  amLODipine (NORVASC) 10 MG tablet Take 10 mg by mouth daily.    Historical Provider, MD  atorvastatin (LIPITOR) 40 MG tablet Take 1 tablet (40 mg total) by mouth daily at 6 PM. 09/02/14   Barton Dubois, MD  AURYXIA 1 GM 210 MG(Fe) TABS Take 420 mg by mouth 3 (three) times daily. 01/26/16   Historical Provider, MD  carvedilol (COREG) 12.5 MG tablet Take 1 tablet (12.5 mg total) by mouth 2 (two) times daily with a meal. Patient not taking: Reported on 01/08/2016 11/04/15   [name], MD  carvedilol (COREG) 25 MG tablet Take 25 mg by mouth 2 (two) times daily with a meal.    Historical Provider, MD  cinacalcet (SENSIPAR) 30 MG tablet Take 30 mg by mouth daily.    Historical Provider, MD  cloNIDine (CATAPRES - DOSED IN MG/24 HR) 0.3 mg/24hr patch Place 0.3 mg onto the skin once a week.    Historical Provider, MD  doxazosin (CARDURA) 2 MG tablet Take 2 mg by mouth daily.    Historical Provider, MD  famotidine (PEPCID) 20 MG tablet Take 1 tablet (20 mg total) by mouth 2 (two) times daily. 02/13/16   Charlesetta Shanks, MD  gabapentin (NEURONTIN) 100 MG capsule Take 100 mg by mouth 3 (three) times daily.    Historical Provider, MD  hydrALAZINE (APRESOLINE) 100 MG tablet Take 100 mg by mouth every 8 (eight) hours.    Historical Provider, MD  isosorbide mononitrate (IMDUR) 60 MG 24 hr tablet Take 1 tablet (60 mg total) by mouth daily. 01/09/16   Smiley Houseman, MD  omeprazole (PRILOSEC) 20 MG capsule Take 20 mg by mouth daily. 07/01/13   Historical Provider, MD  ondansetron (ZOFRAN ODT) 4 MG disintegrating tablet 1m ODT q4 hours prn nausea/vomit 02/13/16   MCharlesetta Shanks MD  oxyCODONE-acetaminophen (PERCOCET/ROXICET) 5-325 MG tablet Take  1-2 tablets by mouth every 6 (six) hours as needed for severe pain. 11/13/15   JCarlisle Cater PA-C  predniSONE (DELTASONE) 10 MG tablet Take 10 mg by mouth daily with breakfast.    Historical Provider, MD  promethazine (PHENERGAN) 25 MG tablet Take 1 tablet (25 mg total) by mouth every 8 (eight) hours as needed for nausea or vomiting. 02/11/16   CDalia Heading PA-C  sevelamer carbonate (RENVELA) 800 MG tablet Take 800-1,600 mg by mouth 3 (three) times daily with meals. 2 tabs three times daily with meals, and 1 tablet with snacks    Historical Provider, MD  warfarin (COUMADIN) 2 MG tablet Take 2 mg by mouth daily.  01/31/16   Historical Provider, MD  warfarin (COUMADIN) 5 MG tablet  01/31/16   Historical Provider, MD  warfarin (COUMADIN) 6 MG tablet Take 6 mg by mouth. 02/28/16   Historical Provider, MD    Family History Family History  Problem Relation Age of Onset  . Hypertension Other     Social History Social History  Substance Use Topics  . Smoking status: Former Smoker    Packs/day: 0.00    Years: 1.00    Types: Cigarettes  . Smokeless tobacco: Never Used     Comment: quit Jan 2014  . Alcohol use No     Allergies   Butalbital-apap-caffeine; Ferrlecit [na ferric gluc cplx in sucrose]; Minoxidil; and Darvocet [propoxyphene n-acetaminophen]   Review of Systems Review of Systems  Constitutional: Negative for fever.  HENT: Negative for sore throat.   Eyes: Negative for redness.  Respiratory: Negative for cough and shortness of breath.   Cardiovascular: Positive for chest pain. Negative for leg swelling.  Gastrointestinal: Negative for abdominal pain.  Genitourinary: Negative for flank pain.  Musculoskeletal: Positive for back pain. Negative for neck pain.  Skin: Negative for rash.  Neurological: Negative for weakness, numbness and headaches.  Hematological: Does not bruise/bleed easily.  Psychiatric/Behavioral: Negative for confusion.     Physical Exam Updated Vital  Signs BP 111/87   Resp 21   Ht _0  (1.88 m)   Wt 74.4 kg   SpO2 100%   BMI 21.06 kg/m   Physical Exam  Constitutional: He appears well-developed and well-nourished. No distress.  HENT:  Head: Atraumatic.  Mouth/Throat: Oropharynx is clear and moist.  Eyes: Pupils are equal, round, and reactive to light.  Neck: Neck supple. No tracheal deviation present.  Cardiovascular: Normal rate, regular rhythm, normal heart sounds and intact distal pulses.  Exam reveals no gallop and no friction rub.   No murmur heard. Pulmonary/Chest: Effort normal and breath sounds normal. No accessory muscle usage. No respiratory distress.  HD cath without sign of infection  Abdominal: Soft. He exhibits no distension. There is no tenderness.  Genitourinary:  Genitourinary Comments: No cva tenderness  Musculoskeletal: Normal range of motion.  CTLS spine, non tender, aligned, no step off. No calf swelling or tenderness.   Neurological: He is alert.  Speech clear. Motor intact bil. stre 5/5.   Skin: Skin is warm and dry. No rash noted. He is not diaphoretic.  Psychiatric: He has a normal mood and affect.  Nursing note and vitals reviewed.    ED Treatments / Results  Labs (all labs ordered are listed, but only abnormal results are displayed) Labs Reviewed  I-STAT CHEM 8, ED - Abnormal; Notable for the following:       Result Value   Potassium 8.4 (*)    BUN 61 (*)    Creatinine, Ser 11.30 (*)    Glucose, Bld 115 (*)    Calcium, Ion 1.03 (*)    Hemoglobin 9.9 (*)    HCT 29.0 (*)    All other components within normal limits  CBC  BASIC METABOLIC PANEL  PROTIME-INR  TROPONIN I  I-STAT TROPOININ, ED  I-STAT CHEM 8, ED  TYPE AND SCREEN    EKG  EKG Interpretation  Date/Time:  Sunday March 31 2016 22:09:44 EDT Ventricular Rate:  34 PR Interval:    QRS Duration: 114 QT Interval:  568 QTC Calculation: 428 R Axis:   -48 Text Interpretation:  Sinus bradycardia Prolonged PR interval  Nonspecific ST abnormality Incomplete left bundle branch block Left ventricular hypertrophy `qrs wider  than previous, with more prominent t waves, ?hyperkalemia Confirmed by Ashok Cordia  MD, Lennette Bihari ([id number]) on 03/31/2016 10:20:24 PM       Radiology No results found.  Procedures Procedures (including critical care time)  Medications Ordered in ED Medications  0.9 %  sodium chloride infusion ( Intravenous New Bag/Given 03/31/16 2238)  calcium chloride injection 1 g (1 g Intravenous Given 03/31/16 2233)  sodium bicarbonate injection 50 mEq (50 mEq Intravenous Given 03/31/16 2237)  dextrose 50 % solution 25 g (25 g Intravenous Given 03/31/16 2237)  insulin aspart (novoLOG) injection 6 Units (6 Units Intravenous Given 03/31/16 2236)  albuterol (PROVENTIL) (2.5 MG/3ML) 0.083% nebulizer solution 5 mg (5 mg Nebulization Given 03/31/16 2242)     Initial Impression / Assessment and Plan / ED Course  I have reviewed the triage vital signs and the nursing notes.  Pertinent labs & imaging results that were available during my care of the patient were reviewed by me and considered in my medical decision making (see chart for details).  Clinical Course   Iv ns. Continuous pulse ox and monitor. o2 [place].   Stat pcxr and stat I stat 8/labs.  K is very high - Renal/Dr Doctor, hospital, is currently in ED seeing another patient, discussed pt, suspected missed HD, and K > 8.  He requests we give Ca, and other hyperkalemia meds in ED, and he will arrange emergent dialysis.   CaCl IV, d50 iv, insulin iv, hco3 iv, alb neb.   Reviewed nursing notes and prior charts for additional history.   Recent ED visits at [facility] with similar c/o.   CRITICAL CARE  RE: severe hyperkalemia, requiring emergent iv medication and emergent dialysis, ESRD on HD, chest pain, severe bradycardia Performed by: Mirna Mires Total critical care time: 35 minutes Critical care time was exclusive of separately billable procedures and treating  other patients. Critical care was necessary to treat or prevent imminent or life-threatening deterioration. Critical care was time spent personally by me on the following activities: development of treatment plan with patient and/or surrogate as well as nursing, discussions with consultants, evaluation of patient's response to treatment, examination of patient, obtaining history from patient or surrogate, ordering and performing treatments and interventions, ordering and review of laboratory studies, ordering and review of radiographic studies, pulse oximetry and re-evaluation of patient's condition.  Recheck, patient alert, content appearing, bp normal.  Dr [place]her[place]/renal arranging emergent dialysis.   Final Clinical Impressions(s) / ED Diagnoses   Final diagnoses:  None    New Prescriptions New Prescriptions   No medications on file     Lajean Saver, MD 04/02/16 1006

## 2016-04-01 DIAGNOSIS — R001 Bradycardia, unspecified: Secondary | ICD-10-CM

## 2016-04-01 DIAGNOSIS — F1910 Other psychoactive substance abuse, uncomplicated: Secondary | ICD-10-CM

## 2016-04-01 DIAGNOSIS — Z992 Dependence on renal dialysis: Secondary | ICD-10-CM

## 2016-04-01 DIAGNOSIS — G8929 Other chronic pain: Secondary | ICD-10-CM

## 2016-04-01 DIAGNOSIS — E875 Hyperkalemia: Principal | ICD-10-CM

## 2016-04-01 DIAGNOSIS — N2889 Other specified disorders of kidney and ureter: Secondary | ICD-10-CM

## 2016-04-01 DIAGNOSIS — R109 Unspecified abdominal pain: Secondary | ICD-10-CM

## 2016-04-01 DIAGNOSIS — N186 End stage renal disease: Secondary | ICD-10-CM

## 2016-04-01 LAB — BASIC METABOLIC PANEL
ANION GAP: 12 (ref 5–15)
BUN: 65 mg/dL — ABNORMAL HIGH (ref 6–20)
CHLORIDE: 102 mmol/L (ref 101–111)
CO2: 23 mmol/L (ref 22–32)
Calcium: 8.9 mg/dL (ref 8.9–10.3)
Creatinine, Ser: 11.37 mg/dL — ABNORMAL HIGH (ref 0.61–1.24)
GFR calc Af Amer: 5 mL/min — ABNORMAL LOW (ref >60)
GFR calc non Af Amer: 4 mL/min — ABNORMAL LOW (ref >60)
Glucose, Bld: 101 mg/dL — ABNORMAL HIGH (ref 65–99)
POTASSIUM: 6.1 mmol/L — AB (ref 3.5–5.1)
Sodium: 137 mmol/L (ref 135–145)

## 2016-04-01 LAB — TROPONIN I
Troponin I: 0.03 ng/mL (ref <0.03)
Troponin I: 0.03 ng/mL (ref <0.03)
Troponin I: 0.06 ng/mL (ref <0.03)

## 2016-04-01 LAB — I-STAT CHEM 8, ED
BUN: 57 mg/dL — ABNORMAL HIGH (ref 6–20)
Calcium, Ion: 1.08 mmol/L — ABNORMAL LOW (ref 1.15–1.40)
Chloride: 105 mmol/L (ref 101–111)
Creatinine, Ser: 11.7 mg/dL — ABNORMAL HIGH (ref 0.61–1.24)
Glucose, Bld: 76 mg/dL (ref 65–99)
HCT: 27 % — ABNORMAL LOW (ref 39.0–52.0)
Hemoglobin: 9.2 g/dL — ABNORMAL LOW (ref 13.0–17.0)
Potassium: 6 mmol/L — ABNORMAL HIGH (ref 3.5–5.1)
Sodium: 139 mmol/L (ref 135–145)
TCO2: 24 mmol/L (ref 0–100)

## 2016-04-01 LAB — MRSA PCR SCREENING: MRSA by PCR: POSITIVE — AB

## 2016-04-01 LAB — GLUCOSE, CAPILLARY
GLUCOSE-CAPILLARY: 103 mg/dL — AB (ref 65–99)
GLUCOSE-CAPILLARY: 151 mg/dL — AB (ref 65–99)

## 2016-04-01 LAB — POTASSIUM: POTASSIUM: 4.6 mmol/L (ref 3.5–5.1)

## 2016-04-01 LAB — PROTIME-INR
INR: 1.3
Prothrombin Time: 16.2 seconds — ABNORMAL HIGH (ref 11.4–15.2)

## 2016-04-01 MED ORDER — OXYCODONE-ACETAMINOPHEN 5-325 MG PO TABS
1.0000 | ORAL_TABLET | Freq: Four times a day (QID) | ORAL | Status: DC | PRN
  Administered 2016-04-01: 2 via ORAL
  Filled 2016-04-01: qty 2

## 2016-04-01 MED ORDER — PENTAFLUOROPROP-TETRAFLUOROETH EX AERO: 1.0000 "application " | INHALATION_SPRAY | CUTANEOUS | Status: DC | PRN

## 2016-04-01 MED ORDER — AMLODIPINE BESYLATE 10 MG PO TABS
10.0000 mg | ORAL_TABLET | Freq: Every day | ORAL | Status: DC
  Administered 2016-04-01: 10 mg via ORAL
  Filled 2016-04-01: qty 1

## 2016-04-01 MED ORDER — CARVEDILOL 12.5 MG PO TABS
25.0000 mg | ORAL_TABLET | Freq: Two times a day (BID) | ORAL | Status: DC
  Administered 2016-04-01: 25 mg via ORAL
  Filled 2016-04-01: qty 2

## 2016-04-01 MED ORDER — LIDOCAINE HCL (PF) 1 % IJ SOLN: 5.0000 mL | INTRAMUSCULAR | Status: DC | PRN

## 2016-04-01 MED ORDER — FAMOTIDINE 20 MG PO TABS: 20.0000 mg | ORAL_TABLET | Freq: Every day | ORAL | Status: DC

## 2016-04-01 MED ORDER — CLONIDINE HCL 0.3 MG/24HR TD PTWK: 0.3000 mg | MEDICATED_PATCH | TRANSDERMAL | Status: DC

## 2016-04-01 MED ORDER — SODIUM CHLORIDE 0.9% FLUSH
3.0000 mL | Freq: Two times a day (BID) | INTRAVENOUS | Status: DC
  Administered 2016-04-01 (×2): 3 mL via INTRAVENOUS

## 2016-04-01 MED ORDER — HYDRALAZINE HCL 50 MG PO TABS
100.0000 mg | ORAL_TABLET | Freq: Three times a day (TID) | ORAL | Status: DC
  Administered 2016-04-01: 100 mg via ORAL
  Filled 2016-04-01 (×2): qty 2

## 2016-04-01 MED ORDER — CINACALCET HCL 30 MG PO TABS: 30.0000 mg | ORAL_TABLET | Freq: Every day | ORAL | Status: DC

## 2016-04-01 MED ORDER — DOXAZOSIN MESYLATE 2 MG PO TABS
2.0000 mg | ORAL_TABLET | Freq: Every day | ORAL | Status: DC
  Administered 2016-04-01: 2 mg via ORAL
  Filled 2016-04-01: qty 1

## 2016-04-01 MED ORDER — SODIUM CHLORIDE 0.9 % IV SOLN: 100.0000 mL | INTRAVENOUS | Status: DC | PRN

## 2016-04-01 MED ORDER — PREDNISONE 20 MG PO TABS
10.0000 mg | ORAL_TABLET | Freq: Every day | ORAL | Status: DC
  Administered 2016-04-01: 10 mg via ORAL
  Filled 2016-04-01: qty 1

## 2016-04-01 MED ORDER — LIDOCAINE-PRILOCAINE 2.5-2.5 % EX CREA: 1.0000 "application " | TOPICAL_CREAM | CUTANEOUS | Status: DC | PRN

## 2016-04-01 MED ORDER — ALTEPLASE 2 MG IJ SOLR: 2.0000 mg | Freq: Once | INTRAMUSCULAR | Status: DC | PRN

## 2016-04-01 MED ORDER — HEPARIN SODIUM (PORCINE) 1000 UNIT/ML DIALYSIS
1000.0000 [IU] | INTRAMUSCULAR | Status: DC | PRN
  Filled 2016-04-01: qty 1

## 2016-04-01 MED ORDER — WARFARIN - PHARMACIST DOSING INPATIENT: Freq: Every day | Status: DC

## 2016-04-01 MED ORDER — PANTOPRAZOLE SODIUM 40 MG PO TBEC
40.0000 mg | DELAYED_RELEASE_TABLET | Freq: Every day | ORAL | Status: DC
  Administered 2016-04-01: 40 mg via ORAL
  Filled 2016-04-01: qty 1

## 2016-04-01 MED ORDER — SEVELAMER CARBONATE 800 MG PO TABS: 800.0000 mg | ORAL_TABLET | ORAL | Status: DC | PRN

## 2016-04-01 MED ORDER — WARFARIN SODIUM 5 MG PO TABS
5.0000 mg | ORAL_TABLET | ORAL | Status: AC
  Administered 2016-04-01: 5 mg via ORAL
  Filled 2016-04-01: qty 1

## 2016-04-01 MED ORDER — ISOSORBIDE MONONITRATE ER 60 MG PO TB24
60.0000 mg | ORAL_TABLET | Freq: Every day | ORAL | Status: DC
  Administered 2016-04-01: 60 mg via ORAL
  Filled 2016-04-01: qty 1

## 2016-04-01 MED ORDER — HEPARIN SODIUM (PORCINE) 5000 UNIT/ML IJ SOLN
5000.0000 [IU] | Freq: Three times a day (TID) | INTRAMUSCULAR | Status: DC
  Administered 2016-04-01: 5000 [IU] via SUBCUTANEOUS
  Filled 2016-04-01: qty 1

## 2016-04-01 MED ORDER — WARFARIN SODIUM 7.5 MG PO TABS: 7.5000 mg | ORAL_TABLET | Freq: Once | ORAL | Status: DC

## 2016-04-01 NOTE — H&P (Signed)
History and Physical    Frank [name] KVQ:[id number] DOB: Aug 06, 1965 DOA: 03/31/2016   PCP: [name] Patient Chief Complaint:  Chief Complaint  Patient presents with  . Chest Pain  . Bradycardia    HPI: Frank [name] is a 50 y.o. male with medical history significant of ESRD, currently looks like patient is going to EDs here and at [place] for his dialysis based on notes, chronic pain with drug seeking behavior and very frequent ED visits.  Patient presents to the ED today with c/o chest pain radiating into his back for 2 days.  Pain worsened while driving roughly 40 min PTA.  Associated SOB, pain is constant, severe.  Pulse was roughly 50 when EMS picked him up but is running in low 30s high 20s on arrival to ED.  He states he had full treatment of dialysis yesterday (im not so convinced of this since it looks like the 6th was the last time he was dialyzed in the ED at [place]).  Patient also endorses subjective fevers, 1 episode of vomiting, and mild abd pain.  ED Course: Potassium is 8.4, his bradycardia resolves with medical treatment of hyperkalemia.  He is headed to the dialysis unit tonight, initial troponin is negative.  Review of Systems: As per HPI otherwise 10 point review of systems negative.    Past Medical History:  Diagnosis Date  . Anemia   . Anxiety   . Chronic combined systolic and diastolic CHF (congestive heart failure) (HCC)    a. EF 20-25% by echo in 08/2015 b. echo 10/2015: EF 35-40%, diffuse HK, severe LAE, moderate RAE, small pericardial effusion  . Complication of anesthesia    itching, sore throat  . Depression   . Dialysis patient ([place])   . ESRD (end stage renal disease) ([place])    due to HTN per patient, followed at [facility], s/p failed kidney transplant - dialysis Tue, Th, Sat  . Hyperkalemia 12/2015  . Hypertension   . Junctional rhythm    a. noted in 08/2015: hyperkalemic at that time  b. 12/2015: presented in junctional rhythm w/ K+ of 6.6.  Resolved with improvement of K+ levels.  . Nonischemic cardiomyopathy ([place])    a. 08/2014: cath showing minimal CAD, but tortuous arteries noted.   . Renal insufficiency   . Shortness of breath   . Type II diabetes mellitus (HCC)    No history per patient, but remains under history as A1c would not be accurate given on dialysis    Past Surgical History:  Procedure Laterality Date  . CAPD INSERTION    . CAPD REMOVAL    . INGUINAL HERNIA REPAIR Right 02/14/2015   Procedure: REPAIR INCARCERATED RIGHT INGUINAL HERNIA;  Surgeon: [name], MD;  Location: [place];  Service: General;  Laterality: Right;  . INSERTION OF DIALYSIS CATHETER Right 09/23/2015   Procedure: exchange of Right internal Dialysis Catheter.;  Surgeon: [name], MD;  Location: [place];  Service: Vascular;  Laterality: Right;  . KIDNEY RECEIPIENT  2006   failed and started HD in March 2014  . LEFT HEART CATHETERIZATION WITH CORONARY ANGIOGRAM N/A 09/02/2014   Procedure: LEFT HEART CATHETERIZATION WITH CORONARY ANGIOGRAM;  Surgeon: [name], MD;  Location[facility] LAB;  Service: Cardiovascular;  Laterality: N/A;     reports that he has quit smoking. His smoking use included Cigarettes. He smoked 0.00 packs per day for 1.00 year. He has never used smokeless tobacco. He reports that  he does not drink alcohol or use drugs.  Allergies  Allergen Reactions  . Butalbital-Apap-Caffeine Shortness Of Breath and Swelling    Swelling in throat  . Ferrlecit [Na Ferric Gluc Cplx In Sucrose] Shortness Of Breath, Swelling and Other (See Comments)    Swelling in throat  . Minoxidil Shortness Of Breath  . Darvocet [Propoxyphene N-Acetaminophen] Hives  . Other Hives    Allergy listed on file from [place] (Pt did not mention any allergies) Pt able to do norco (tylenol)    Family History  Problem Relation Age of Onset  . Hypertension Other       Prior to Admission medications   Medication Sig Start Date End Date Taking?  Authorizing Provider  allopurinol (ZYLOPRIM) 100 MG tablet Take 100 mg by mouth daily.    Historical Provider, MD  amLODipine (NORVASC) 10 MG tablet Take 10 mg by mouth daily.    Historical Provider, MD  atorvastatin (LIPITOR) 40 MG tablet Take 1 tablet (40 mg total) by mouth daily at 6 PM. 09/02/14   Barton Dubois, MD  AURYXIA 1 GM 210 MG(Fe) TABS Take 420 mg by mouth 3 (three) times daily. 01/26/16   Historical Provider, MD  carvedilol (COREG) 25 MG tablet Take 25 mg by mouth 2 (two) times daily with a meal.    Historical Provider, MD  cinacalcet (SENSIPAR) 30 MG tablet Take 30 mg by mouth daily.    Historical Provider, MD  cloNIDine (CATAPRES - DOSED IN MG/24 HR) 0.3 mg/24hr patch Place 0.3 mg onto the skin once a week.    Historical Provider, MD  doxazosin (CARDURA) 2 MG tablet Take 2 mg by mouth daily.    Historical Provider, MD  famotidine (PEPCID) 20 MG tablet Take 1 tablet (20 mg total) by mouth 2 (two) times daily. 02/13/16   Charlesetta Shanks, MD  gabapentin (NEURONTIN) 100 MG capsule Take 100 mg by mouth 3 (three) times daily.    Historical Provider, MD  hydrALAZINE (APRESOLINE) 100 MG tablet Take 100 mg by mouth every 8 (eight) hours.    Historical Provider, MD  isosorbide mononitrate (IMDUR) 60 MG 24 hr tablet Take 1 tablet (60 mg total) by mouth daily. 01/09/16   Smiley Houseman, MD  omeprazole (PRILOSEC) 20 MG capsule Take 20 mg by mouth daily. 07/01/13   Historical Provider, MD  ondansetron (ZOFRAN ODT) 4 MG disintegrating tablet 72m ODT q4 hours prn nausea/vomit 02/13/16   MCharlesetta Shanks MD  predniSONE (DELTASONE) 10 MG tablet Take 10 mg by mouth daily with breakfast.    Historical Provider, MD  promethazine (PHENERGAN) 25 MG tablet Take 1 tablet (25 mg total) by mouth every 8 (eight) hours as needed for nausea or vomiting. 02/11/16   CDalia Heading PA-C  sevelamer carbonate (RENVELA) 800 MG tablet Take 800-1,600 mg by mouth 3 (three) times daily with meals. 2 tabs three times daily  with meals, and 1 tablet with snacks    Historical Provider, MD  warfarin (COUMADIN) 2 MG tablet Take 2 mg by mouth daily.  01/31/16   Historical Provider, MD  warfarin (COUMADIN) 5 MG tablet  01/31/16   Historical Provider, MD  warfarin (COUMADIN) 6 MG tablet Take 6 mg by mouth. 02/28/16   Historical Provider, MD    Physical Exam: Vitals:   03/31/16 2215 04/01/16 0000 04/01/16 0015 04/01/16 0026  BP: 111/87 172/99 157/99   Pulse:  70 66   Resp: _0 Temp:    97.8 F (36.6  C)  TempSrc:    Oral  SpO2: 100% 95% 96%   Weight:      Height:          Constitutional: NAD, calm, appears ill and distressed Eyes: PERRL, lids and conjunctivae normal ENMT: Mucous membranes are moist. Posterior pharynx clear of any exudate or lesions.Normal dentition.  Neck: normal, supple, no masses, no thyromegaly Respiratory: clear to auscultation bilaterally, no wheezing, no crackles. Normal respiratory effort. No accessory muscle use.  Cardiovascular: Regular rate and rhythm, no murmurs / rubs / gallops. No extremity edema. 2+ pedal pulses. No carotid bruits.  Abdomen: generalized tenderness, no masses palpated. No hepatosplenomegaly. Bowel sounds positive.  Musculoskeletal: no clubbing / cyanosis. No joint deformity upper and lower extremities. Good ROM, no contractures. Normal muscle tone.  Skin: no rashes, lesions, ulcers. No induration Neurologic: CN 2-12 grossly intact. Sensation intact, DTR normal. Strength 5/5 in all 4.  Psychiatric: Normal judgment and insight. Alert and oriented x 3. Normal mood.    Labs on Admission: I have personally reviewed following labs and imaging studies  CBC:  Recent Labs Lab 03/31/16 2221 03/31/16 2224  WBC 7.3  --   HGB 8.7* 9.9*  HCT 28.8* 29.0*  MCV 90.0  --   PLT 199  --    Basic Metabolic Panel:  Recent Labs Lab 03/31/16 2221 03/31/16 2224  NA 134* 135  K >7.5* 8.4*  CL 103 104  CO2 16*  --   GLUCOSE 101* 115*  BUN 59* 61*  CREATININE  10.77* 11.30*  CALCIUM 8.8*  --    GFR: Estimated Creatinine Clearance: 8 mL/min (by C-G formula based on SCr of 11.3 mg/dL). Liver Function Tests: No results for input(s): AST, ALT, ALKPHOS, BILITOT, PROT, ALBUMIN in the last 168 hours. No results for input(s): LIPASE, AMYLASE in the last 168 hours. No results for input(s): AMMONIA in the last 168 hours. Coagulation Profile:  Recent Labs Lab 03/31/16 2221  INR 1.21   Cardiac Enzymes:  Recent Labs Lab 03/31/16 2221  TROPONINI 0.03*   BNP (last 3 results) No results for input(s): PROBNP in the last 8760 hours. HbA1C: No results for input(s): HGBA1C in the last 72 hours. CBG: No results for input(s): GLUCAP in the last 168 hours. Lipid Profile: No results for input(s): CHOL, HDL, LDLCALC, TRIG, CHOLHDL, LDLDIRECT in the last 72 hours. Thyroid Function Tests: No results for input(s): TSH, T4TOTAL, FREET4, T3FREE, THYROIDAB in the last 72 hours. Anemia Panel: No results for input(s): VITAMINB12, FOLATE, FERRITIN, TIBC, IRON, RETICCTPCT in the last 72 hours. Urine analysis:    Component Value Date/Time   COLORURINE YELLOW 10/18/2013 0419   APPEARANCEUR CLEAR 10/18/2013 0419   LABSPEC 1.008 10/18/2013 0419   PHURINE 8.5 (H) 10/18/2013 0419   GLUCOSEU 100 (A) 10/18/2013 0419   HGBUR TRACE (A) 10/18/2013 0419   BILIRUBINUR NEGATIVE 10/18/2013 0419   KETONESUR NEGATIVE 10/18/2013 0419   PROTEINUR 100 (A) 10/18/2013 0419   UROBILINOGEN 0.2 10/18/2013 0419   NITRITE NEGATIVE 10/18/2013 0419   LEUKOCYTESUR NEGATIVE 10/18/2013 0419   Sepsis Labs: _0 (procalcitonin:4,lacticidven:4) )No results found for this or any previous visit (from the past 240 hour(s)).   Radiological Exams on Admission: Dg Chest Port 1 View  Result Date: 03/31/2016 CLINICAL DATA:  Chest pain radiating to the back.  Bradycardia. EXAM: PORTABLE CHEST 1 VIEW COMPARISON:  03/11/2016 FINDINGS: Right jugular central line with tip at the cavoatrial  junction. Unchanged moderate cardiomegaly. No dense consolidation.  No large effusion. IMPRESSION: Limited  study. No dense consolidation or large effusion. Unchanged cardiomegaly. Electronically Signed   By: Andreas Newport M.D.   On: 03/31/2016 23:35    EKG: Independently reviewed.  Assessment/Plan Principal Problem:   Hyperkalemia Active Problems:   Drug-seeking behavior   Left renal mass   ESRD (end stage renal disease) on dialysis (HCC)   Chronic abdominal pain   Bradycardia    1. Hyperkalemia - 1. Dialysis tonight 2. States that his last dialysis was yesterday; however, I am somewhat suspicious that it may not have been.  Due to not having dialysis center recently he has been going to the ED here and at [place] these past few weeks for dialysis, in which case last dialysis looks to have been on the 6th based on [facility] records. 2. Chronic abdominal pain - 1. Not believed to be due to the small renal mass 2. Per urology believed to be due to chronic rejection of renal transplant 3. Of note his urologist has refused to prescribe further narcotics to the patient based on his office note in care everywhere 03/28/16 4. Due to patient not currently receiving narcotics from any outpatient provider other than occasional EDP scripts, I will not put him on narcotics as inpatient for the moment. 3. L renal mass - see above and urology note 4. ESRD - dialysis tonight, nephrology following 5. Bradycardia - 1. Tele monitor 2. Resolved with medical treatment of hyperkalemia 3. Presumably due to hyperkalemia   DVT prophylaxis: Coumadin Code Status: Full Family Communication: No family in room Consults called: Dr. Jonnie Finner has spoken to Dr. Ashok Cordia about patient and dialysis tonight Admission status: Admit to inpatient   [name] DO [facility] Pager [phone] from 7PM-7AM  If 7AM-7PM, please contact the day physician for the patient [email or web address] Password  [facility]  04/01/2016, 12:35 AM

## 2016-04-01 NOTE — Progress Notes (Signed)
ANTICOAGULATION CONSULT NOTE Pharmacy Consult for Coumadin Indication: chronic pulmonary thromboembolism syndrome; h/o PE/DVT  Allergies  Allergen Reactions  . Butalbital-Apap-Caffeine Shortness Of Breath and Swelling    Swelling in throat  . Ferrlecit [Na Ferric Gluc Cplx In Sucrose] Shortness Of Breath, Swelling and Other (See Comments)    Swelling in throat  . Minoxidil Shortness Of Breath  . Darvocet [Propoxyphene N-Acetaminophen] Hives  . Other Hives    Allergy listed on file from [place] (Pt did not mention any allergies) Pt able to do norco (tylenol)    Patient Measurements: Height: _0  (188 cm) Weight: 173 lb 1 oz (78.5 kg) IBW/kg (Calculated) : 82.2  Vital Signs: Temp: 97.4 F (36.3 C) (09/11 0730) Temp Source: Oral (09/11 0730) BP: 165/106 (09/11 0930) Pulse Rate: 70 (09/11 0930)  Labs:  Recent Labs  03/31/16 2221 03/31/16 2224 04/01/16 0034 04/01/16 0114 04/01/16 0528  HGB 8.7* 9.9*  --  9.2*  --   HCT 28.8* 29.0*  --  27.0*  --   PLT 199  --   --   --   --   LABPROT 15.4*  --   --   --  16.2*  INR 1.21  --   --   --  1.30  CREATININE 10.77* 11.30*  --  11.70* 11.37*  TROPONINI 0.03*  --  0.03*  --  0.03*    Estimated Creatinine Clearance: 8.4 mL/min (by C-G formula based on SCr of 11.37 mg/dL).   Assessment: 50yo male presents w/ hyperkalemia and bradycardia, to continue Coumadin for h/o PE/DVT; pt reports that he takes Coumadin 86m daily w/ last dose 9/10, but it is unclear whether this is accurate; current INR well below goal.  INR = 1.3 this AM  Goal of Therapy:  INR 2-3   Plan:  Coumadin 7.5 mg po x 1 tonight Daily INR  Thank you LAnette Guarneri PharmD [phone]/05/2016,10:14 AM

## 2016-04-01 NOTE — Discharge Instructions (Addendum)
Frank [name] he was seen in the hospital because her potassium was severely elevated. This was improved in the emergency department and subsequently after dialysis. Nephrology dialyze you and cleared for discharge home. upon review of her history, it appears he had a history of a mass in either kidney. I recommend he follow up with the [facility] for evaluation of this mass on the kidney. I have included follow-up information in your discharge paperwork. He should be scheduled for dialysis tomorrow. You have an appointment are to scheduled for a primary care physician on September 14 at 8:45 AM. Please make this appointment sooner care can be coordinated properly.

## 2016-04-01 NOTE — Progress Notes (Signed)
Pt has orders to be discharged. Discharge instructions given and pt has no additional questions at this time. Pt refused to have RN review discharge instructions with him. Telemetry box removed. IV's removed and sites in good condition. MD ordered potassium lab draw and EKG before patient left hospital; both were done. Patient walked out of hospital with son in a rush before lab results.

## 2016-04-01 NOTE — Care Management Note (Signed)
Case Management Note  Patient Details  Name: Frank [name] MRN: [id number] Date of Birth: [birth year]  Subjective/Objective:       CM following for progression and d/c planning.              Action/Plan: 04/01/16 Chart reviewed , noted pt H&P, no HH or DME needs identified. Pt ambulatory, travels to hospital by car to receive HD at intervals. No other needs identified.   Expected Discharge Date:    04/01/2016              Expected Discharge Plan:  Home/Self Care  In-House Referral:  NA  Discharge planning Services  NA  Post Acute Care Choice:  NA Choice offered to:  NA  DME Arranged:  N/A DME Agency:  NA  HH Arranged:  NA HH Agency:  NA  Status of Service:  Completed, signed off  If discussed at [place] of Stay Meetings, dates discussed:    Additional Comments:  Adron Bene, RN 04/01/2016, 1:29 PM

## 2016-04-01 NOTE — Progress Notes (Signed)
ANTICOAGULATION CONSULT NOTE - Initial Consult  Pharmacy Consult for Coumadin Indication: chronic pulmonary thromboembolism syndrome; h/o PE/DVT  Allergies  Allergen Reactions  . Butalbital-Apap-Caffeine Shortness Of Breath and Swelling    Swelling in throat  . Ferrlecit [Na Ferric Gluc Cplx In Sucrose] Shortness Of Breath, Swelling and Other (See Comments)    Swelling in throat  . Minoxidil Shortness Of Breath  . Darvocet [Propoxyphene N-Acetaminophen] Hives  . Other Hives    Allergy listed on file from [place] (Pt did not mention any allergies) Pt able to do norco (tylenol)    Patient Measurements: Height: 6' 2" (188 cm) Weight: 164 lb (74.4 kg) IBW/kg (Calculated) : 82.2  Vital Signs: Temp: 97.8 F (36.6 C) (09/11 0026) Temp Source: Oral (09/11 0026) BP: 153/99 (09/11 0030) Pulse Rate: 67 (09/11 0030)  Labs:  Recent Labs  03/31/16 2221 03/31/16 2224  HGB 8.7* 9.9*  HCT 28.8* 29.0*  PLT 199  --   LABPROT 15.4*  --   INR 1.21  --   CREATININE 10.77* 11.30*  TROPONINI 0.03*  --     Estimated Creatinine Clearance: 8 mL/min (by C-G formula based on SCr of 11.3 mg/dL).   Medical History: Past Medical History:  Diagnosis Date  . Anemia   . Anxiety   . Chronic combined systolic and diastolic CHF (congestive heart failure) (HCC)    a. EF 20-25% by echo in 08/2015 b. echo 10/2015: EF 35-40%, diffuse HK, severe LAE, moderate RAE, small pericardial effusion  . Complication of anesthesia    itching, sore throat  . Depression   . Dialysis patient ([place])   . ESRD (end stage renal disease) ([place])    due to HTN per patient, followed at [facility], s/p failed kidney transplant - dialysis Tue, Th, Sat  . Hyperkalemia 12/2015  . Hypertension   . Junctional rhythm    a. noted in 08/2015: hyperkalemic at that time  b. 12/2015: presented in junctional rhythm w/ K+ of 6.6. Resolved with improvement of K+ levels.  . Nonischemic cardiomyopathy ([place])    a. 08/2014: cath  showing minimal CAD, but tortuous arteries noted.   . Renal insufficiency   . Shortness of breath   . Type II diabetes mellitus (HCC)    No history per patient, but remains under history as A1c would not be accurate given on dialysis    Assessment: 50yo male presents w/ hyperkalemia and bradycardia, to continue Coumadin for h/o PE/DVT; pt reports that he takes Coumadin 12m daily w/ last dose 9/10, but it is unclear whether this is accurate; current INR well below goal.  Goal of Therapy:  INR 2-3   Plan:  Will give Coumadin 512mpo x1 now in addition to the 43m43me reports taking and monitor INR for dose adjustments.  VerWynona NeatharmD, BCPS  04/01/2016,12:48 AM

## 2016-04-01 NOTE — ED Notes (Signed)
IV Access: William Hamburger RN and this RN attempted IV placement x5 with success x2.

## 2016-04-01 NOTE — Discharge Summary (Signed)
Physician Discharge Summary  Frank [name]:[id number] DOB: [birth year] DOA: 03/31/2016  PCP: [name] Patient  Admit date: 03/31/2016 Discharge date: 04/01/2016  Admitted From:  Home Disposition:  Home  Recommendations for Outpatient Follow-up:  1. Follow up with PCP in 1 week 2. Patient needs follow-up with urology regarding renal mass  Home Health: No Equipment/Devices: None  Discharge Condition: Guarded CODE STATUS: Full code Diet recommendation: Renal diet  Brief/Interim Summary: Frank [name] is a 50 y.o. male with medical history significant of ESRD currently receiving care at [facility] and [facility] for dialysis. He presented complaining  Of chest pain radiating to his back for two days. EKG non-ischemic but significant for peaked T-waves. He had profound bradycardia to high 20s low 30s on arrival to the ED. Troponin trended and low. Potassium found to be 8.4. He was given albuterol, novolog 6u with d50, sodium bicarbonate, calcium chloride with improvement to 6 and resolution of bradycardia. He received dialysis in the morning and repeat potassium was 4.6 with an EKG showing resolution of peaked t-waves.  Discharge Diagnoses:  Principal Problem:   Hyperkalemia Active Problems:   Drug-seeking behavior   Left renal mass   ESRD (end stage renal disease) on dialysis (HCC)   Chronic abdominal pain   Bradycardia   Discharge Instructions     Medication List    TAKE these medications   allopurinol 100 MG tablet Commonly known as:  ZYLOPRIM Take 100 mg by mouth daily.   amLODipine 10 MG tablet Commonly known as:  NORVASC Take 10 mg by mouth daily.   atorvastatin 40 MG tablet Commonly known as:  LIPITOR Take 1 tablet (40 mg total) by mouth daily at 6 PM.   AURYXIA 1 GM 210 MG(Fe) Tabs Generic drug:  Ferric Citrate Take 420 mg by mouth 3 (three) times daily.   carvedilol 25 MG tablet Commonly known as:  COREG Take 25 mg by mouth 2 (two) times daily with a  meal.   cinacalcet 30 MG tablet Commonly known as:  SENSIPAR Take 30 mg by mouth daily.   cloNIDine 0.3 mg/24hr patch Commonly known as:  CATAPRES - Dosed in mg/24 hr Place 0.3 mg onto the skin once a week.   doxazosin 2 MG tablet Commonly known as:  CARDURA Take 2 mg by mouth daily.   famotidine 20 MG tablet Commonly known as:  PEPCID Take 1 tablet (20 mg total) by mouth 2 (two) times daily.   gabapentin 100 MG capsule Commonly known as:  NEURONTIN Take 100 mg by mouth 3 (three) times daily.   hydrALAZINE 100 MG tablet Commonly known as:  APRESOLINE Take 100 mg by mouth every 8 (eight) hours.   isosorbide mononitrate 60 MG 24 hr tablet Commonly known as:  IMDUR Take 1 tablet (60 mg total) by mouth daily.   omeprazole 20 MG capsule Commonly known as:  PRILOSEC Take 20 mg by mouth daily.   ondansetron 4 MG disintegrating tablet Commonly known as:  ZOFRAN ODT 77m ODT q4 hours prn nausea/vomit   predniSONE 10 MG tablet Commonly known as:  DELTASONE Take 10 mg by mouth daily with breakfast.   promethazine 25 MG tablet Commonly known as:  PHENERGAN Take 1 tablet (25 mg total) by mouth every 8 (eight) hours as needed for nausea or vomiting.   sevelamer carbonate 800 MG tablet Commonly known as:  RENVELA Take 800-1,600 mg by mouth 3 (three) times daily with meals. 2 tabs three times daily with meals,  and 1 tablet with snacks   warfarin 2 MG tablet Commonly known as:  COUMADIN Take 2 mg by mouth daily. Take with 90m tablet to equal 723m  warfarin 5 MG tablet Commonly known as:  COUMADIN Take 5 mg by mouth daily. Take with 34m55mablet to equal 7mg33m   Follow-up Information    JEGEAngelica Chessman. Go on 04/04/2016.   Specialty:  Internal Medicine Why:  8:45AM Contact information: [phone]    [facility] Schedule an appointment as soon as possible for a visit in 1 week(s).   Why:  Renal  mass Contact information: [address]  [place] [address] [place] [phone]          Allergies  Allergen Reactions  . Butalbital-Apap-Caffeine Shortness Of Breath and Swelling    Swelling in throat  . Ferrlecit [Na Ferric Gluc Cplx In Sucrose] Shortness Of Breath, Swelling and Other (See Comments)    Swelling in throat  . Minoxidil Shortness Of Breath  . Darvocet [Propoxyphene N-Acetaminophen] Hives  . Other Hives    Allergy listed on file from [facility] did not mention any allergies) Pt able to do norco (tylenol)    Consultations:  Nephrology   Procedures/Studies: Dg Chest 2 View  Result Date: 03/11/2016 CLINICAL DATA:  Nausea vomiting. EXAM: CHEST  2 VIEW COMPARISON:  03/05/2016.  02/11/2016 FINDINGS: Dialysis catheter in stable position. Cardiomegaly with pulmonary vascular prominence and bilateral interstitial prominence consistent congestive heart failure. Findings have progressed from prior study 03/05/2016. IMPRESSION: 1. Dialysis catheter stable position. 2. Congestive heart failure with progressive bilateral pulmonary interstitial edema . Electronically Signed   By: ThomMarcello Mooresgister   On: 03/11/2016 09:15   Dg Chest 2 View  Result Date: 03/05/2016 CLINICAL DATA:  Short of breath.  Dialysis patient EXAM: CHEST  2 VIEW COMPARISON:  02/11/2016 FINDINGS: Interval improvement in pulmonary edema since the prior study. Mild vascular congestion. Small pleural effusions. Dialysis catheter unchanged in position. IMPRESSION: Improving pulmonary edema. Electronically Signed   By: CharFranchot Gallo.   On: 03/05/2016 16:39   Dg Chest Port 1 View  Result Date: 03/31/2016 CLINICAL DATA:  Chest pain radiating to the back.  Bradycardia. EXAM: PORTABLE CHEST 1 VIEW COMPARISON:  03/11/2016 FINDINGS: Right jugular central line with tip at the cavoatrial junction. Unchanged moderate cardiomegaly. No dense consolidation.  No large effusion. IMPRESSION: Limited study. No dense  consolidation or large effusion. Unchanged cardiomegaly. Electronically Signed   By: DaniAndreas Newport.   On: 03/31/2016 23:35      Subjective: Patient reports some chest pain. No other complaints.  Discharge Exam: Vitals:   04/01/16 1100 04/01/16 1110  BP: (!) 179/118 (!) 171/97  Pulse: 78 70  Resp: 17 13  Temp:  97.6 F (36.4 C)   Vitals:   04/01/16 1000 04/01/16 1030 04/01/16 1100 04/01/16 1110  BP: (!) 180/108 (!) 167/104 (!) 179/118 (!) 171/97  Pulse: 69 75 78 70  Resp: (!) 21 (!) _0 Temp:    97.6 F (36.4 C)  TempSrc:    Oral  SpO2:    100%  Weight:      Height:        General: Pt is alert, awake, not in acute distress Cardiovascular: RRR, S1/S2 +, no rubs, no gallops Respiratory: CTA bilaterally, no wheezing, no rhonchi Abdominal: Soft, NT, ND, bowel sounds +  Musculoskeletal: no edema, no cyanosis. Reproducible chest pain.    The results of significant diagnostics from this hospitalization (including imaging, microbiology, ancillary and laboratory) are listed below for reference.     Microbiology: No results found for this or any previous visit (from the past 240 hour(s)).   Labs: BNP (last 3 results)  Recent Labs  07/17/15 2135 09/24/15 0324  BNP >4,500.0* >2,778.2*   Basic Metabolic Panel:  Recent Labs Lab 03/31/16 2221 03/31/16 2224 04/01/16 0114 04/01/16 0528  NA 134* 135 139 137  K >7.5* 8.4* 6.0* 6.1*  CL 103 104 105 102  CO2 16*  --   --  23  GLUCOSE 101* 115* 76 101*  BUN 59* 61* 57* 65*  CREATININE 10.77* 11.30* 11.70* 11.37*  CALCIUM 8.8*  --   --  8.9   Liver Function Tests: No results for input(s): AST, ALT, ALKPHOS, BILITOT, PROT, ALBUMIN in the last 168 hours. No results for input(s): LIPASE, AMYLASE in the last 168 hours. No results for input(s): AMMONIA in the last 168 hours. CBC:  Recent Labs Lab 03/31/16 2221 03/31/16 2224 04/01/16 0114  WBC 7.3  --   --   HGB 8.7* 9.9* 9.2*  HCT 28.8* 29.0* 27.0*   MCV 90.0  --   --   PLT 199  --   --    Cardiac Enzymes:  Recent Labs Lab 03/31/16 2221 04/01/16 0034 04/01/16 0528  TROPONINI 0.03* 0.03* 0.03*   BNP: Invalid input(s): POCBNP CBG:  Recent Labs Lab 04/01/16 0146 04/01/16 1155  GLUCAP 103* 151*   D-Dimer No results for input(s): DDIMER in the last 72 hours. Hgb A1c No results for input(s): HGBA1C in the last 72 hours. Lipid Profile No results for input(s): CHOL, HDL, LDLCALC, TRIG, CHOLHDL, LDLDIRECT in the last 72 hours. Thyroid function studies No results for input(s): TSH, T4TOTAL, T3FREE, THYROIDAB in the last 72 hours.  Invalid input(s): FREET3 Anemia work up No results for input(s): VITAMINB12, FOLATE, FERRITIN, TIBC, IRON, RETICCTPCT in the last 72 hours. Urinalysis    Component Value Date/Time   COLORURINE YELLOW 10/18/2013 0419   APPEARANCEUR CLEAR 10/18/2013 0419   LABSPEC 1.008 10/18/2013 0419   PHURINE 8.5 (H) 10/18/2013 0419   GLUCOSEU 100 (A) 10/18/2013 0419   HGBUR TRACE (A) 10/18/2013 0419   BILIRUBINUR NEGATIVE 10/18/2013 0419   KETONESUR NEGATIVE 10/18/2013 0419   PROTEINUR 100 (A) 10/18/2013 0419   UROBILINOGEN 0.2 10/18/2013 0419   NITRITE NEGATIVE 10/18/2013 0419   LEUKOCYTESUR NEGATIVE 10/18/2013 0419   Sepsis Labs Invalid input(s): PROCALCITONIN,  WBC,  LACTICIDVEN Microbiology No results found for this or any previous visit (from the past 240 hour(s)).   Time coordinating discharge: Over 30 minutes  SIGNED:   Cordelia Poche, MD  [facility] 04/01/2016, 1:01 PM Pager [phone]  If 7PM-7AM, please contact night-coverage [email or web address] Password [email or web address]

## 2016-04-01 NOTE — Consult Note (Signed)
Renal Service Consult Note [name]  Frank [name] 04/01/2016 [name] Requesting Physician:  DR Alcario Drought  Reason for Consult:  ESRD pt w bradycardia and high K+ HPI: The patient is a 50 y.o. year-old with history of DM2, HTN, NICM/ EF 35-40%, depression, and ESRD on HD in [place].  Presents with near syncope while driving today.  "I pulled over" when I felt bad.  No palpiations, bit tongue or incontinence.  In ED pt found bradycardic.  Serum K returned at 8.0. Asked to see for hyperkalemia.  EKG improved after acute IV Rx in ED with IV insulin/ glu/ Ca++/ NaHCO3.  Now is in NSR , no peaked T's or QRS widneing.    His main c/o is chest pain.   Says he went to HD on Sat , which was yesterday.  Says that [place] is letting him go this week, Thursday, is his last HD with [facility] per patient.  He says they're supposed to be "setting me up" in [place] or [place].  He is living alone in [name] now, temporary he says.  No etoh or tobacco.     Old chart >> June 2017 - fluid overload/ ACS ruled out, high K, ESRD, anxiety, hx PE April 2017 - accel HTN, chronic PE, HL, L renal mass enlarge since prior study and c/w renal cell carcinoma, combined syst/ diast CHF, ESRD, drug- seek behavior March 2017 - chest pain , atypical. CXR neg, pt declined CT angio of chest.  EKG w/o ischemic changes.  HTN urgency. ERSD on HD, high K rx with HD. Hx PE on coumadin  ROS  no joint pain   no HA  no blurry vision  no rash  no diarrhea   Past Medical History  Past Medical History:  Diagnosis Date  . Anemia   . Anxiety   . Chronic combined systolic and diastolic CHF (congestive heart failure) (HCC)    a. EF 20-25% by echo in 08/2015 b. echo 10/2015: EF 35-40%, diffuse HK, severe LAE, moderate RAE, small pericardial effusion  . Complication of anesthesia    itching, sore throat  . Depression   . Dialysis patient ([place])   . ESRD (end stage renal disease) ([place])    due  to HTN per patient, [facility], s/p failed kidney transplant - dialysis Tue, Th, Sat  . Hyperkalemia 12/2015  . Hypertension   . Junctional rhythm    a. noted in 08/2015: hyperkalemic at that time  b. 12/2015: presented in junctional rhythm w/ K+ of 6.6. Resolved with improvement of K+ levels.  . Nonischemic cardiomyopathy ([place])    a. 08/2014: cath showing minimal CAD, but tortuous arteries noted.   . Renal insufficiency   . Shortness of breath   . Type II diabetes mellitus (HCC)    No history per patient, but remains under history as A1c would not be accurate given on dialysis   Past Surgical History  Past Surgical History:  Procedure Laterality Date  . CAPD INSERTION    . CAPD REMOVAL    . INGUINAL HERNIA REPAIR Right 02/14/2015   Procedure: REPAIR INCARCERATED RIGHT INGUINAL HERNIA;  Surgeon: [name], MD;  Location: [place];  Service: General;  Laterality: Right;  . INSERTION OF DIALYSIS CATHETER Right 09/23/2015   Procedure: exchange of Right internal Dialysis Catheter.;  Surgeon: [name], MD;  Location: [place];  Service: Vascular;  Laterality: Right;  . KIDNEY RECEIPIENT  2006   failed and  started HD in March 2014  . LEFT HEART CATHETERIZATION WITH CORONARY ANGIOGRAM N/A 09/02/2014   Procedure: LEFT HEART CATHETERIZATION WITH CORONARY ANGIOGRAM;  Surgeon: [name], MD;  Location: [facility] C[facility] LAB;  Service: Cardiovascular;  Laterality: N/A;   Family History  Family History  Problem Relation Age of Onset  . Hypertension Other    Social History  reports that he has quit smoking. His smoking use included Cigarettes. He smoked 0.00 packs per day for 1.00 year. He has never used smokeless tobacco. He reports that he does not drink alcohol or use drugs. Allergies  Allergies  Allergen Reactions  . Butalbital-Apap-Caffeine Shortness Of Breath and Swelling    Swelling in throat  . Ferrlecit [Na Ferric Gluc Cplx In Sucrose] Shortness Of Breath, Swelling and Other (See  Comments)    Swelling in throat  . Minoxidil Shortness Of Breath  . Darvocet [Propoxyphene N-Acetaminophen] Hives  . Other Hives    Allergy listed on file from [place] (Pt did not mention any allergies) Pt able to do norco (tylenol)   Home medications Prior to Admission medications   Medication Sig Start Date End Date Taking? Authorizing Provider  allopurinol (ZYLOPRIM) 100 MG tablet Take 100 mg by mouth daily.   Yes Historical Provider, MD  amLODipine (NORVASC) 10 MG tablet Take 10 mg by mouth daily.   Yes Historical Provider, MD  atorvastatin (LIPITOR) 40 MG tablet Take 1 tablet (40 mg total) by mouth daily at 6 PM. 09/02/14  Yes Barton Dubois, MD  AURYXIA 1 GM 210 MG(Fe) TABS Take 420 mg by mouth 3 (three) times daily. 01/26/16  Yes Historical Provider, MD  carvedilol (COREG) 25 MG tablet Take 25 mg by mouth 2 (two) times daily with a meal.   Yes Historical Provider, MD  cinacalcet (SENSIPAR) 30 MG tablet Take 30 mg by mouth daily.   Yes Historical Provider, MD  cloNIDine (CATAPRES - DOSED IN MG/24 HR) 0.3 mg/24hr patch Place 0.3 mg onto the skin once a week.   Yes Historical Provider, MD  doxazosin (CARDURA) 2 MG tablet Take 2 mg by mouth daily.   Yes Historical Provider, MD  famotidine (PEPCID) 20 MG tablet Take 1 tablet (20 mg total) by mouth 2 (two) times daily. 02/13/16  Yes Charlesetta Shanks, MD  gabapentin (NEURONTIN) 100 MG capsule Take 100 mg by mouth 3 (three) times daily.   Yes Historical Provider, MD  hydrALAZINE (APRESOLINE) 100 MG tablet Take 100 mg by mouth every 8 (eight) hours.   Yes Historical Provider, MD  isosorbide mononitrate (IMDUR) 60 MG 24 hr tablet Take 1 tablet (60 mg total) by mouth daily. 01/09/16  Yes Smiley Houseman, MD  omeprazole (PRILOSEC) 20 MG capsule Take 20 mg by mouth daily. 07/01/13  Yes Historical Provider, MD  ondansetron (ZOFRAN ODT) 4 MG disintegrating tablet 77m ODT q4 hours prn nausea/vomit 02/13/16  Yes MCharlesetta Shanks MD  predniSONE  (DELTASONE) 10 MG tablet Take 10 mg by mouth daily with breakfast.   Yes Historical Provider, MD  promethazine (PHENERGAN) 25 MG tablet Take 1 tablet (25 mg total) by mouth every 8 (eight) hours as needed for nausea or vomiting. 02/11/16  Yes CDalia Heading PA-C  sevelamer carbonate (RENVELA) 800 MG tablet Take 800-1,600 mg by mouth 3 (three) times daily with meals. 2 tabs three times daily with meals, and 1 tablet with snacks   Yes Historical Provider, MD  warfarin (COUMADIN) 2 MG tablet Take 2 mg by mouth daily. Take with  57m tablet to equal 7103m7/12/17  Yes Historical Provider, MD  warfarin (COUMADIN) 5 MG tablet Take 5 mg by mouth daily. Take with 39m61mablet to equal 7mg339m12/17  Yes Historical Provider, MD   Liver Function Tests No results for input(s): AST, ALT, ALKPHOS, BILITOT, PROT, ALBUMIN in the last 168 hours. No results for input(s): LIPASE, AMYLASE in the last 168 hours. CBC  Recent Labs Lab 03/31/16 2221 03/31/16 2224  WBC 7.3  --   HGB 8.7* 9.9*  HCT 28.8* 29.0*  MCV 90.0  --   PLT 199  --    Basic Metabolic Panel  Recent Labs Lab 03/31/16 2221 03/31/16 2224  NA 134* 135  K >7.5* 8.4*  CL 103 104  CO2 16*  --   GLUCOSE 101* 115*  BUN 59* 61*  CREATININE 10.77* 11.30*  CALCIUM 8.8*  --    Iron/TIBC/Ferritin/ %Sat    Component Value Date/Time   IRON 62 03/15/2016 0900   TIBC 188 (L) 03/15/2016 0900   FERRITIN 328 11/02/2015 0346   IRONPCTSAT 33 03/15/2016 0900    Vitals:   04/01/16 0000 04/01/16 0015 04/01/16 0026 04/01/16 0030  BP: 172/99 157/99  153/99  Pulse: 70 66  67  Resp: 15 15    Temp:   97.8 F (36.6 C)   TempSrc:   Oral   SpO2: 95% 96%  97%  Weight:      Height:       Exam Gen awake, alert, chron ill appearing No rash, cyanosis or gangrene Sclera anicteric, throat clear  No jvd or bruits Chest clear bilat RRR no MRG Abd soft ntnd no mass or ascites +bs GU normal male MS no joint effusions or deformity Ext trace ankle edema  bilat / no wounds or ulcers Neuro is alert, Ox 3 , nf R IJ HD cath site no drainage / erythema/ fluctuance  EKG - after Rx is NSR at 64 bpm, no ischemic changes   Assessment: 1 Hyperkalemia - severe, but improved from 8 > 6 with ED Rx.  Plan HD first shift this am.   2 ESRD usual HD TTS in [place] 3  HTN 4  L renal mass 5  NICM  6  MBD 7  Anemia get records 8  Volume - no gross excess, get OP edw  Plan - HD upstairs first shift this am.  Please ask urology to see while her for the enlargening renal mass.    Rob Kelly SplinterCaroNewell [employer] [phone] [phone]/2017, 1:04 AM

## 2016-04-01 NOTE — Procedures (Signed)
On HD, repeat K this am after ER meds is down to 6.0 and bradycardia / widened QRS is resolved.  Plan HD now, max UF, 1K bath for 1/2 of session.  Can be dc'd home after HD from renal standpoint.     I was present at this dialysis session, have reviewed the session itself and made  appropriate changes Kelly Splinter MD [place] pager [phone]    cell [phone] 04/01/2016, 9:41 AM

## 2016-04-01 NOTE — ED Notes (Signed)
Ronalee Belts RN updated on lab results and plan for dialysis per nephrology.

## 2016-04-02 DIAGNOSIS — I517 Cardiomegaly: Secondary | ICD-10-CM | POA: Diagnosis not present

## 2016-04-02 DIAGNOSIS — K5900 Constipation, unspecified: Secondary | ICD-10-CM | POA: Diagnosis not present

## 2016-04-02 DIAGNOSIS — I120 Hypertensive chronic kidney disease with stage 5 chronic kidney disease or end stage renal disease: Secondary | ICD-10-CM | POA: Diagnosis not present

## 2016-04-02 DIAGNOSIS — R791 Abnormal coagulation profile: Secondary | ICD-10-CM | POA: Diagnosis not present

## 2016-04-02 DIAGNOSIS — Z992 Dependence on renal dialysis: Secondary | ICD-10-CM | POA: Diagnosis not present

## 2016-04-02 DIAGNOSIS — Z7901 Long term (current) use of anticoagulants: Secondary | ICD-10-CM | POA: Diagnosis not present

## 2016-04-02 DIAGNOSIS — I770 Arteriovenous fistula, acquired: Secondary | ICD-10-CM | POA: Diagnosis not present

## 2016-04-02 DIAGNOSIS — I4581 Long QT syndrome: Secondary | ICD-10-CM | POA: Diagnosis not present

## 2016-04-02 DIAGNOSIS — I5022 Chronic systolic (congestive) heart failure: Secondary | ICD-10-CM | POA: Diagnosis present

## 2016-04-02 DIAGNOSIS — Z01818 Encounter for other preprocedural examination: Secondary | ICD-10-CM | POA: Diagnosis not present

## 2016-04-02 DIAGNOSIS — I132 Hypertensive heart and chronic kidney disease with heart failure and with stage 5 chronic kidney disease, or end stage renal disease: Secondary | ICD-10-CM | POA: Diagnosis present

## 2016-04-02 DIAGNOSIS — T8611 Kidney transplant rejection: Secondary | ICD-10-CM | POA: Diagnosis not present

## 2016-04-02 DIAGNOSIS — L7632 Postprocedural hematoma of skin and subcutaneous tissue following other procedure: Secondary | ICD-10-CM | POA: Diagnosis not present

## 2016-04-02 DIAGNOSIS — G4733 Obstructive sleep apnea (adult) (pediatric): Secondary | ICD-10-CM | POA: Diagnosis present

## 2016-04-02 DIAGNOSIS — N186 End stage renal disease: Secondary | ICD-10-CM | POA: Diagnosis not present

## 2016-04-02 DIAGNOSIS — I82721 Chronic embolism and thrombosis of deep veins of right upper extremity: Secondary | ICD-10-CM | POA: Diagnosis not present

## 2016-04-02 DIAGNOSIS — G6289 Other specified polyneuropathies: Secondary | ICD-10-CM | POA: Diagnosis not present

## 2016-04-02 DIAGNOSIS — I82409 Acute embolism and thrombosis of unspecified deep veins of unspecified lower extremity: Secondary | ICD-10-CM | POA: Diagnosis not present

## 2016-04-02 DIAGNOSIS — K219 Gastro-esophageal reflux disease without esophagitis: Secondary | ICD-10-CM | POA: Diagnosis not present

## 2016-04-02 DIAGNOSIS — Z86711 Personal history of pulmonary embolism: Secondary | ICD-10-CM | POA: Diagnosis not present

## 2016-04-02 DIAGNOSIS — T8612 Kidney transplant failure: Secondary | ICD-10-CM | POA: Diagnosis present

## 2016-04-02 DIAGNOSIS — N2581 Secondary hyperparathyroidism of renal origin: Secondary | ICD-10-CM | POA: Diagnosis not present

## 2016-04-02 DIAGNOSIS — D631 Anemia in chronic kidney disease: Secondary | ICD-10-CM | POA: Diagnosis not present

## 2016-04-02 DIAGNOSIS — Z86718 Personal history of other venous thrombosis and embolism: Secondary | ICD-10-CM | POA: Diagnosis not present

## 2016-04-02 DIAGNOSIS — E1122 Type 2 diabetes mellitus with diabetic chronic kidney disease: Secondary | ICD-10-CM | POA: Diagnosis present

## 2016-04-02 DIAGNOSIS — Z79899 Other long term (current) drug therapy: Secondary | ICD-10-CM | POA: Diagnosis not present

## 2016-04-02 DIAGNOSIS — E785 Hyperlipidemia, unspecified: Secondary | ICD-10-CM | POA: Diagnosis not present

## 2016-04-02 DIAGNOSIS — Z418 Encounter for other procedures for purposes other than remedying health state: Secondary | ICD-10-CM | POA: Diagnosis not present

## 2016-04-04 ENCOUNTER — Ambulatory Visit: Payer: Self-pay | Admitting: Internal Medicine

## 2016-04-18 ENCOUNTER — Ambulatory Visit: Payer: Self-pay

## 2016-04-20 DIAGNOSIS — Z79899 Other long term (current) drug therapy: Secondary | ICD-10-CM | POA: Diagnosis not present

## 2016-04-20 DIAGNOSIS — Z888 Allergy status to other drugs, medicaments and biological substances status: Secondary | ICD-10-CM | POA: Diagnosis not present

## 2016-04-20 DIAGNOSIS — Z7901 Long term (current) use of anticoagulants: Secondary | ICD-10-CM | POA: Diagnosis not present

## 2016-04-20 DIAGNOSIS — E875 Hyperkalemia: Secondary | ICD-10-CM | POA: Diagnosis not present

## 2016-04-20 DIAGNOSIS — K219 Gastro-esophageal reflux disease without esophagitis: Secondary | ICD-10-CM | POA: Diagnosis not present

## 2016-04-20 DIAGNOSIS — I132 Hypertensive heart and chronic kidney disease with heart failure and with stage 5 chronic kidney disease, or end stage renal disease: Secondary | ICD-10-CM | POA: Diagnosis not present

## 2016-04-20 DIAGNOSIS — Z87891 Personal history of nicotine dependence: Secondary | ICD-10-CM | POA: Diagnosis not present

## 2016-04-20 DIAGNOSIS — D649 Anemia, unspecified: Secondary | ICD-10-CM | POA: Diagnosis not present

## 2016-04-20 DIAGNOSIS — Z86718 Personal history of other venous thrombosis and embolism: Secondary | ICD-10-CM | POA: Diagnosis not present

## 2016-04-20 DIAGNOSIS — Z856 Personal history of leukemia: Secondary | ICD-10-CM | POA: Diagnosis not present

## 2016-04-20 DIAGNOSIS — I509 Heart failure, unspecified: Secondary | ICD-10-CM | POA: Diagnosis not present

## 2016-04-20 DIAGNOSIS — Z940 Kidney transplant status: Secondary | ICD-10-CM | POA: Diagnosis not present

## 2016-04-20 DIAGNOSIS — I517 Cardiomegaly: Secondary | ICD-10-CM | POA: Diagnosis not present

## 2016-04-20 DIAGNOSIS — E1122 Type 2 diabetes mellitus with diabetic chronic kidney disease: Secondary | ICD-10-CM | POA: Diagnosis not present

## 2016-04-20 DIAGNOSIS — N186 End stage renal disease: Secondary | ICD-10-CM | POA: Diagnosis not present

## 2016-04-20 DIAGNOSIS — Z992 Dependence on renal dialysis: Secondary | ICD-10-CM | POA: Diagnosis not present

## 2016-04-20 DIAGNOSIS — R0989 Other specified symptoms and signs involving the circulatory and respiratory systems: Secondary | ICD-10-CM | POA: Diagnosis not present

## 2016-04-20 DIAGNOSIS — I120 Hypertensive chronic kidney disease with stage 5 chronic kidney disease or end stage renal disease: Secondary | ICD-10-CM | POA: Diagnosis not present

## 2016-04-22 DIAGNOSIS — I5022 Chronic systolic (congestive) heart failure: Secondary | ICD-10-CM | POA: Diagnosis not present

## 2016-04-22 DIAGNOSIS — N2889 Other specified disorders of kidney and ureter: Secondary | ICD-10-CM | POA: Diagnosis not present

## 2016-04-22 DIAGNOSIS — Z992 Dependence on renal dialysis: Secondary | ICD-10-CM | POA: Diagnosis not present

## 2016-04-22 DIAGNOSIS — N186 End stage renal disease: Secondary | ICD-10-CM | POA: Diagnosis not present

## 2016-04-25 DIAGNOSIS — D649 Anemia, unspecified: Secondary | ICD-10-CM | POA: Diagnosis not present

## 2016-04-25 DIAGNOSIS — E875 Hyperkalemia: Secondary | ICD-10-CM | POA: Diagnosis not present

## 2016-04-25 DIAGNOSIS — E8809 Other disorders of plasma-protein metabolism, not elsewhere classified: Secondary | ICD-10-CM | POA: Diagnosis not present

## 2016-04-25 DIAGNOSIS — Z992 Dependence on renal dialysis: Secondary | ICD-10-CM | POA: Diagnosis not present

## 2016-04-25 DIAGNOSIS — I120 Hypertensive chronic kidney disease with stage 5 chronic kidney disease or end stage renal disease: Secondary | ICD-10-CM | POA: Diagnosis not present

## 2016-04-25 DIAGNOSIS — T8612 Kidney transplant failure: Secondary | ICD-10-CM | POA: Diagnosis not present

## 2016-04-25 DIAGNOSIS — R112 Nausea with vomiting, unspecified: Secondary | ICD-10-CM | POA: Diagnosis not present

## 2016-04-25 DIAGNOSIS — G8929 Other chronic pain: Secondary | ICD-10-CM | POA: Diagnosis not present

## 2016-04-25 DIAGNOSIS — I517 Cardiomegaly: Secondary | ICD-10-CM | POA: Diagnosis not present

## 2016-04-25 DIAGNOSIS — Z4931 Encounter for adequacy testing for hemodialysis: Secondary | ICD-10-CM | POA: Diagnosis not present

## 2016-04-25 DIAGNOSIS — I440 Atrioventricular block, first degree: Secondary | ICD-10-CM | POA: Diagnosis not present

## 2016-04-25 DIAGNOSIS — N186 End stage renal disease: Secondary | ICD-10-CM | POA: Diagnosis not present

## 2016-04-25 DIAGNOSIS — T82848A Pain from vascular prosthetic devices, implants and grafts, initial encounter: Secondary | ICD-10-CM | POA: Diagnosis not present

## 2016-04-25 DIAGNOSIS — Z87891 Personal history of nicotine dependence: Secondary | ICD-10-CM | POA: Diagnosis not present

## 2016-04-25 DIAGNOSIS — R64 Cachexia: Secondary | ICD-10-CM | POA: Diagnosis not present

## 2016-04-25 DIAGNOSIS — R001 Bradycardia, unspecified: Secondary | ICD-10-CM | POA: Diagnosis not present

## 2016-04-25 DIAGNOSIS — M79602 Pain in left arm: Secondary | ICD-10-CM | POA: Diagnosis not present

## 2016-04-25 DIAGNOSIS — Z79899 Other long term (current) drug therapy: Secondary | ICD-10-CM | POA: Diagnosis not present

## 2016-04-25 IMAGING — CR DG ABDOMEN 1V
1 series · 1 of 1 positions shown · non-contrast
Comparison: None.

CLINICAL DATA: Vomiting for 3 days

EXAM:
ABDOMEN - 1 VIEW

[t abdomen supine]
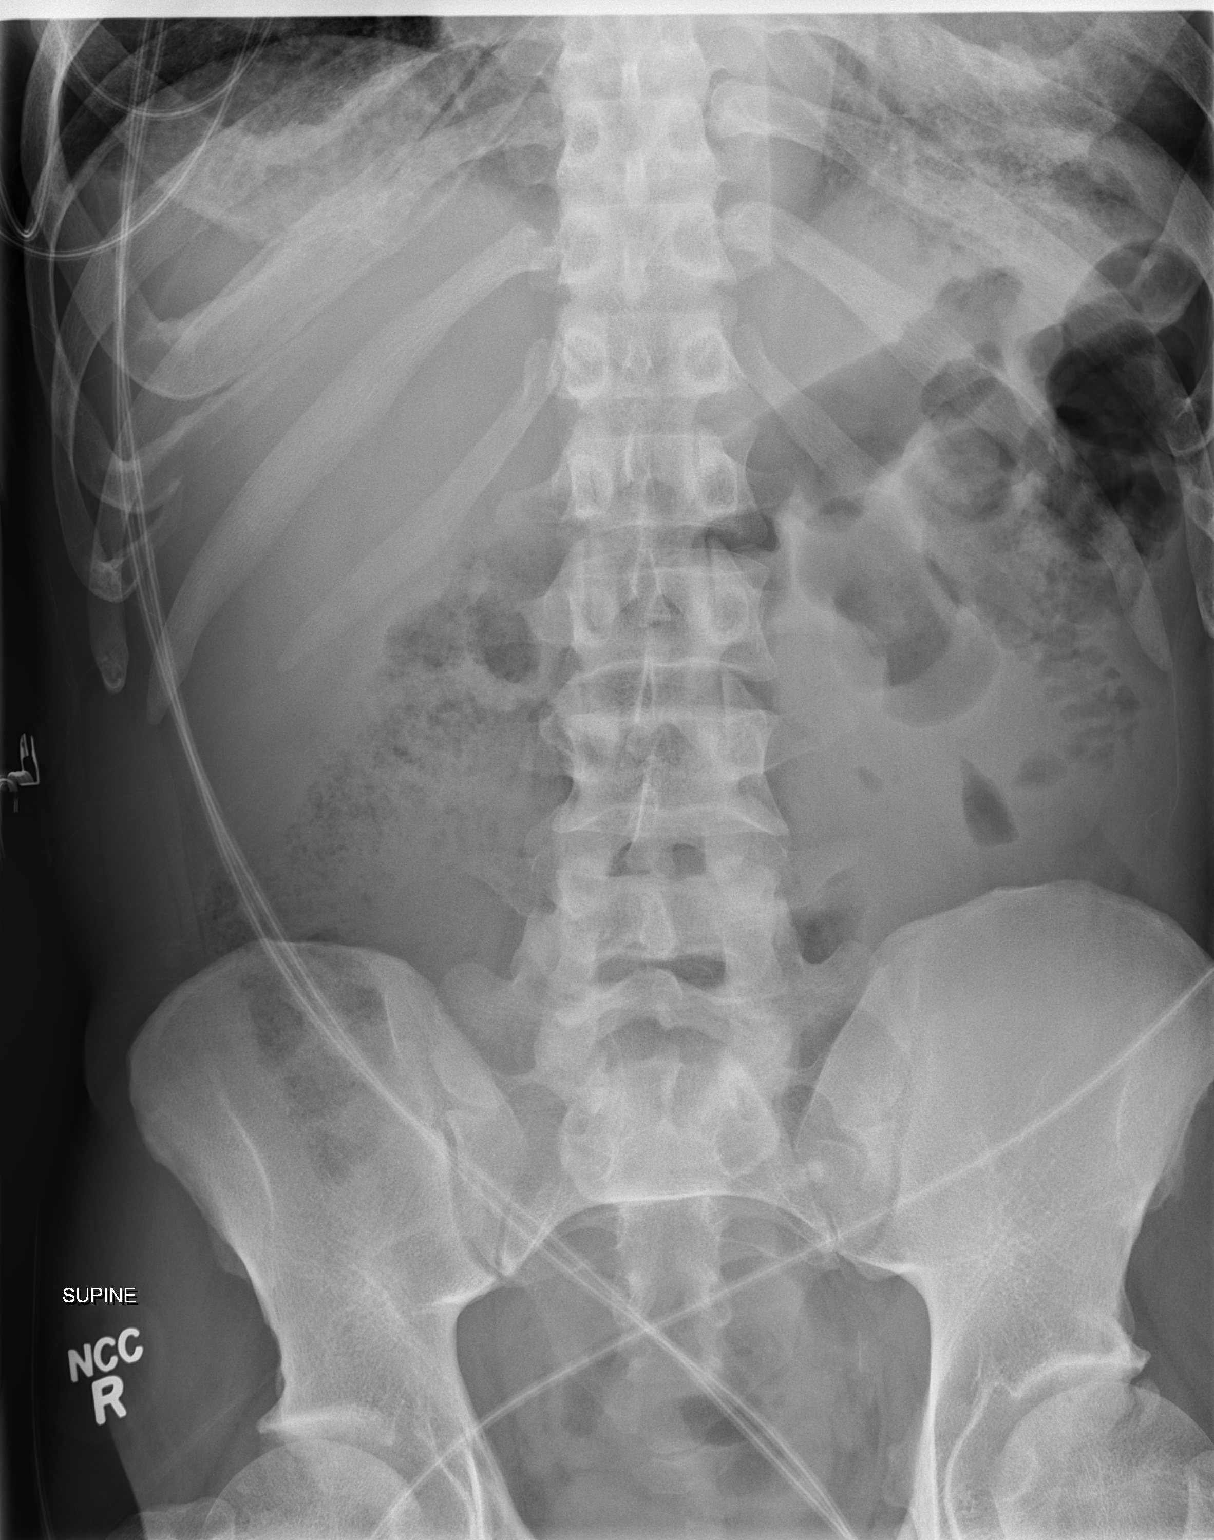

[1 of 1 positions shown; findings below may reference images not displayed]

FINDINGS: There is moderate stool in the colon. The bowel gas pattern is
unremarkable. No obstruction or free air is seen on this supine
examination. There are no abnormal calcifications.
IMPRESSION: Overall bowel gas pattern unremarkable.  Moderate stool in colon.

## 2016-04-25 IMAGING — CR DG CHEST 2V
2 series · 2 of 2 positions shown · non-contrast
Comparison: 10/30/2013.

CLINICAL DATA: Vomiting and shortness of breath.

EXAM:
CHEST  2 VIEW

[w chest lat]
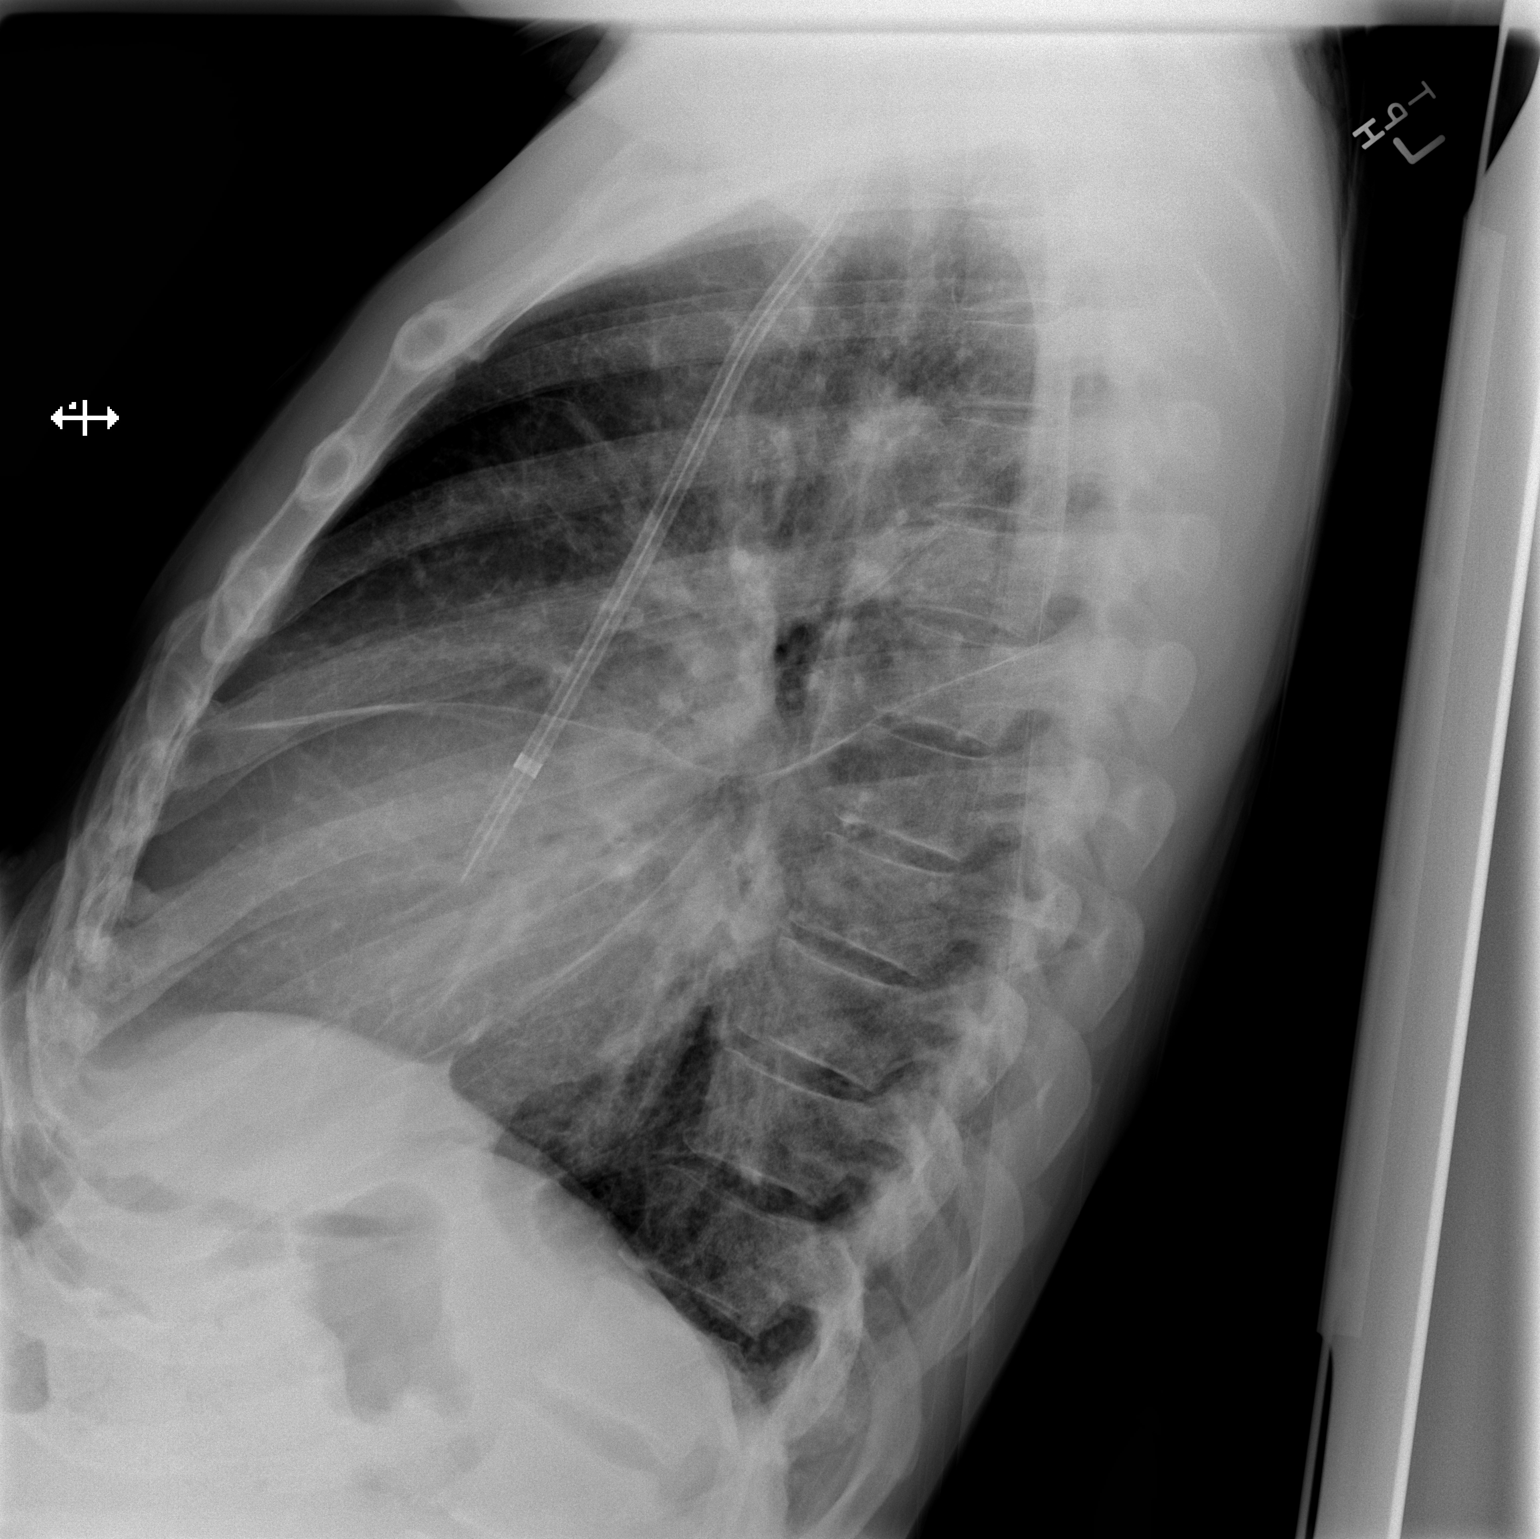

[x chest ap]
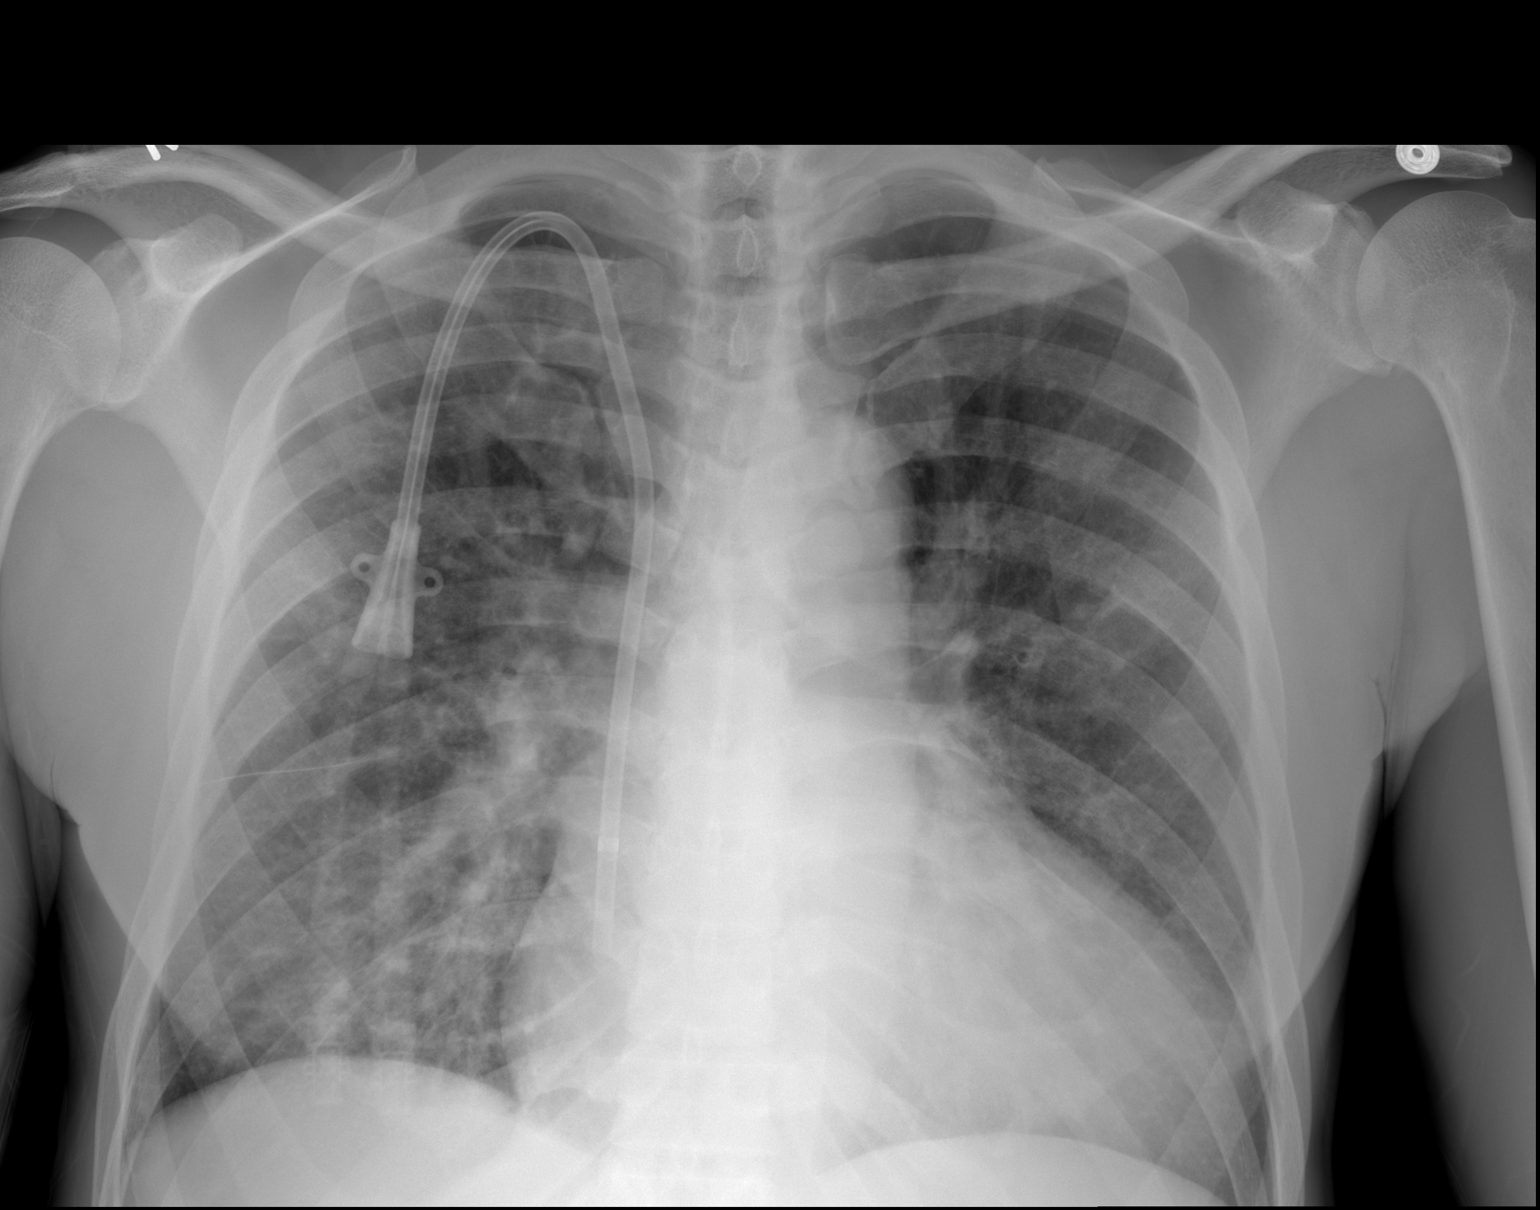

[2 of 2 positions shown; findings below may reference images not displayed]

FINDINGS: Stable enlarged cardiac silhouette and right jugular catheter. No
significant change in diffuse peribronchial thickening and
accentuation of the interstitial markings. Unremarkable bones.
IMPRESSION: Stable cardiomegaly, chronic bronchitic changes and chronic
interstitial lung disease.

## 2016-04-27 DIAGNOSIS — T8612 Kidney transplant failure: Secondary | ICD-10-CM | POA: Diagnosis not present

## 2016-04-27 DIAGNOSIS — Z992 Dependence on renal dialysis: Secondary | ICD-10-CM | POA: Diagnosis not present

## 2016-04-27 DIAGNOSIS — D631 Anemia in chronic kidney disease: Secondary | ICD-10-CM | POA: Diagnosis not present

## 2016-04-27 DIAGNOSIS — T82848A Pain from vascular prosthetic devices, implants and grafts, initial encounter: Secondary | ICD-10-CM | POA: Diagnosis not present

## 2016-04-27 DIAGNOSIS — N186 End stage renal disease: Secondary | ICD-10-CM | POA: Diagnosis not present

## 2016-04-27 DIAGNOSIS — E875 Hyperkalemia: Secondary | ICD-10-CM | POA: Diagnosis not present

## 2016-04-27 DIAGNOSIS — I120 Hypertensive chronic kidney disease with stage 5 chronic kidney disease or end stage renal disease: Secondary | ICD-10-CM | POA: Diagnosis not present

## 2016-04-27 DIAGNOSIS — Z87891 Personal history of nicotine dependence: Secondary | ICD-10-CM | POA: Diagnosis not present

## 2016-04-28 DIAGNOSIS — G8929 Other chronic pain: Secondary | ICD-10-CM | POA: Diagnosis not present

## 2016-04-28 DIAGNOSIS — D631 Anemia in chronic kidney disease: Secondary | ICD-10-CM | POA: Diagnosis not present

## 2016-04-28 DIAGNOSIS — Z992 Dependence on renal dialysis: Secondary | ICD-10-CM | POA: Diagnosis not present

## 2016-04-28 DIAGNOSIS — E785 Hyperlipidemia, unspecified: Secondary | ICD-10-CM | POA: Diagnosis present

## 2016-04-28 DIAGNOSIS — T82848A Pain from vascular prosthetic devices, implants and grafts, initial encounter: Secondary | ICD-10-CM | POA: Diagnosis present

## 2016-04-28 DIAGNOSIS — Z885 Allergy status to narcotic agent status: Secondary | ICD-10-CM | POA: Diagnosis not present

## 2016-04-28 DIAGNOSIS — I770 Arteriovenous fistula, acquired: Secondary | ICD-10-CM | POA: Diagnosis not present

## 2016-04-28 DIAGNOSIS — I5042 Chronic combined systolic (congestive) and diastolic (congestive) heart failure: Secondary | ICD-10-CM | POA: Diagnosis not present

## 2016-04-28 DIAGNOSIS — Z8719 Personal history of other diseases of the digestive system: Secondary | ICD-10-CM | POA: Diagnosis not present

## 2016-04-28 DIAGNOSIS — Z79899 Other long term (current) drug therapy: Secondary | ICD-10-CM | POA: Diagnosis not present

## 2016-04-28 DIAGNOSIS — K219 Gastro-esophageal reflux disease without esophagitis: Secondary | ICD-10-CM | POA: Diagnosis not present

## 2016-04-28 DIAGNOSIS — E8809 Other disorders of plasma-protein metabolism, not elsewhere classified: Secondary | ICD-10-CM | POA: Diagnosis not present

## 2016-04-28 DIAGNOSIS — I10 Essential (primary) hypertension: Secondary | ICD-10-CM | POA: Diagnosis not present

## 2016-04-28 DIAGNOSIS — Z86711 Personal history of pulmonary embolism: Secondary | ICD-10-CM | POA: Diagnosis not present

## 2016-04-28 DIAGNOSIS — N2889 Other specified disorders of kidney and ureter: Secondary | ICD-10-CM | POA: Diagnosis not present

## 2016-04-28 DIAGNOSIS — T8612 Kidney transplant failure: Secondary | ICD-10-CM | POA: Diagnosis present

## 2016-04-28 DIAGNOSIS — G4733 Obstructive sleep apnea (adult) (pediatric): Secondary | ICD-10-CM | POA: Diagnosis present

## 2016-04-28 DIAGNOSIS — E1122 Type 2 diabetes mellitus with diabetic chronic kidney disease: Secondary | ICD-10-CM | POA: Diagnosis not present

## 2016-04-28 DIAGNOSIS — R64 Cachexia: Secondary | ICD-10-CM | POA: Diagnosis present

## 2016-04-28 DIAGNOSIS — R112 Nausea with vomiting, unspecified: Secondary | ICD-10-CM | POA: Diagnosis not present

## 2016-04-28 DIAGNOSIS — R001 Bradycardia, unspecified: Secondary | ICD-10-CM | POA: Diagnosis not present

## 2016-04-28 DIAGNOSIS — Z856 Personal history of leukemia: Secondary | ICD-10-CM | POA: Diagnosis not present

## 2016-04-28 DIAGNOSIS — E875 Hyperkalemia: Secondary | ICD-10-CM | POA: Diagnosis not present

## 2016-04-28 DIAGNOSIS — Z9889 Other specified postprocedural states: Secondary | ICD-10-CM | POA: Diagnosis not present

## 2016-04-28 DIAGNOSIS — I160 Hypertensive urgency: Secondary | ICD-10-CM | POA: Diagnosis not present

## 2016-04-28 DIAGNOSIS — Z888 Allergy status to other drugs, medicaments and biological substances status: Secondary | ICD-10-CM | POA: Diagnosis not present

## 2016-04-28 DIAGNOSIS — Z7901 Long term (current) use of anticoagulants: Secondary | ICD-10-CM | POA: Diagnosis not present

## 2016-04-28 DIAGNOSIS — R1030 Lower abdominal pain, unspecified: Secondary | ICD-10-CM | POA: Diagnosis not present

## 2016-04-28 DIAGNOSIS — N186 End stage renal disease: Secondary | ICD-10-CM | POA: Diagnosis not present

## 2016-04-28 DIAGNOSIS — M109 Gout, unspecified: Secondary | ICD-10-CM | POA: Diagnosis not present

## 2016-04-28 DIAGNOSIS — M79602 Pain in left arm: Secondary | ICD-10-CM | POA: Diagnosis not present

## 2016-04-28 DIAGNOSIS — I2699 Other pulmonary embolism without acute cor pulmonale: Secondary | ICD-10-CM | POA: Diagnosis not present

## 2016-04-28 DIAGNOSIS — Z86718 Personal history of other venous thrombosis and embolism: Secondary | ICD-10-CM | POA: Diagnosis not present

## 2016-04-28 DIAGNOSIS — Z841 Family history of disorders of kidney and ureter: Secondary | ICD-10-CM | POA: Diagnosis not present

## 2016-04-28 DIAGNOSIS — R109 Unspecified abdominal pain: Secondary | ICD-10-CM | POA: Diagnosis not present

## 2016-04-28 DIAGNOSIS — I132 Hypertensive heart and chronic kidney disease with heart failure and with stage 5 chronic kidney disease, or end stage renal disease: Secondary | ICD-10-CM | POA: Diagnosis not present

## 2016-04-28 DIAGNOSIS — I120 Hypertensive chronic kidney disease with stage 5 chronic kidney disease or end stage renal disease: Secondary | ICD-10-CM | POA: Diagnosis not present

## 2016-04-28 DIAGNOSIS — G894 Chronic pain syndrome: Secondary | ICD-10-CM | POA: Diagnosis not present

## 2016-04-28 DIAGNOSIS — Z87891 Personal history of nicotine dependence: Secondary | ICD-10-CM | POA: Diagnosis not present

## 2016-04-28 DIAGNOSIS — I517 Cardiomegaly: Secondary | ICD-10-CM | POA: Diagnosis not present

## 2016-04-28 DIAGNOSIS — Z85528 Personal history of other malignant neoplasm of kidney: Secondary | ICD-10-CM | POA: Diagnosis not present

## 2016-05-02 ENCOUNTER — Ambulatory Visit: Payer: Medicare Other | Attending: Internal Medicine | Admitting: Internal Medicine

## 2016-05-02 ENCOUNTER — Encounter: Payer: Self-pay | Admitting: Internal Medicine

## 2016-05-02 VITALS — BP 103/62 | HR 85 | Temp 98.4°F | Resp 18 | Ht 74.0 in | Wt 162.0 lb

## 2016-05-02 DIAGNOSIS — G894 Chronic pain syndrome: Secondary | ICD-10-CM | POA: Diagnosis not present

## 2016-05-02 DIAGNOSIS — N186 End stage renal disease: Secondary | ICD-10-CM | POA: Diagnosis not present

## 2016-05-02 DIAGNOSIS — I132 Hypertensive heart and chronic kidney disease with heart failure and with stage 5 chronic kidney disease, or end stage renal disease: Secondary | ICD-10-CM | POA: Diagnosis not present

## 2016-05-02 DIAGNOSIS — I10 Essential (primary) hypertension: Secondary | ICD-10-CM | POA: Diagnosis not present

## 2016-05-02 DIAGNOSIS — E1122 Type 2 diabetes mellitus with diabetic chronic kidney disease: Secondary | ICD-10-CM | POA: Diagnosis not present

## 2016-05-02 DIAGNOSIS — N2889 Other specified disorders of kidney and ureter: Secondary | ICD-10-CM | POA: Diagnosis not present

## 2016-05-02 DIAGNOSIS — I5042 Chronic combined systolic (congestive) and diastolic (congestive) heart failure: Secondary | ICD-10-CM | POA: Insufficient documentation

## 2016-05-02 DIAGNOSIS — Z888 Allergy status to other drugs, medicaments and biological substances status: Secondary | ICD-10-CM | POA: Insufficient documentation

## 2016-05-02 DIAGNOSIS — Z885 Allergy status to narcotic agent status: Secondary | ICD-10-CM | POA: Insufficient documentation

## 2016-05-02 DIAGNOSIS — Z992 Dependence on renal dialysis: Secondary | ICD-10-CM | POA: Insufficient documentation

## 2016-05-02 DIAGNOSIS — Z79899 Other long term (current) drug therapy: Secondary | ICD-10-CM | POA: Insufficient documentation

## 2016-05-02 DIAGNOSIS — Z7901 Long term (current) use of anticoagulants: Secondary | ICD-10-CM | POA: Insufficient documentation

## 2016-05-02 IMAGING — CR DG CHEST 2V
2 series · 2 of 2 positions shown · non-contrast
Comparison: Chest radiograph performed 12/05/2013

CLINICAL DATA: Mid chest pain and shortness of breath.

EXAM:
CHEST  2 VIEW

[w chest lat]
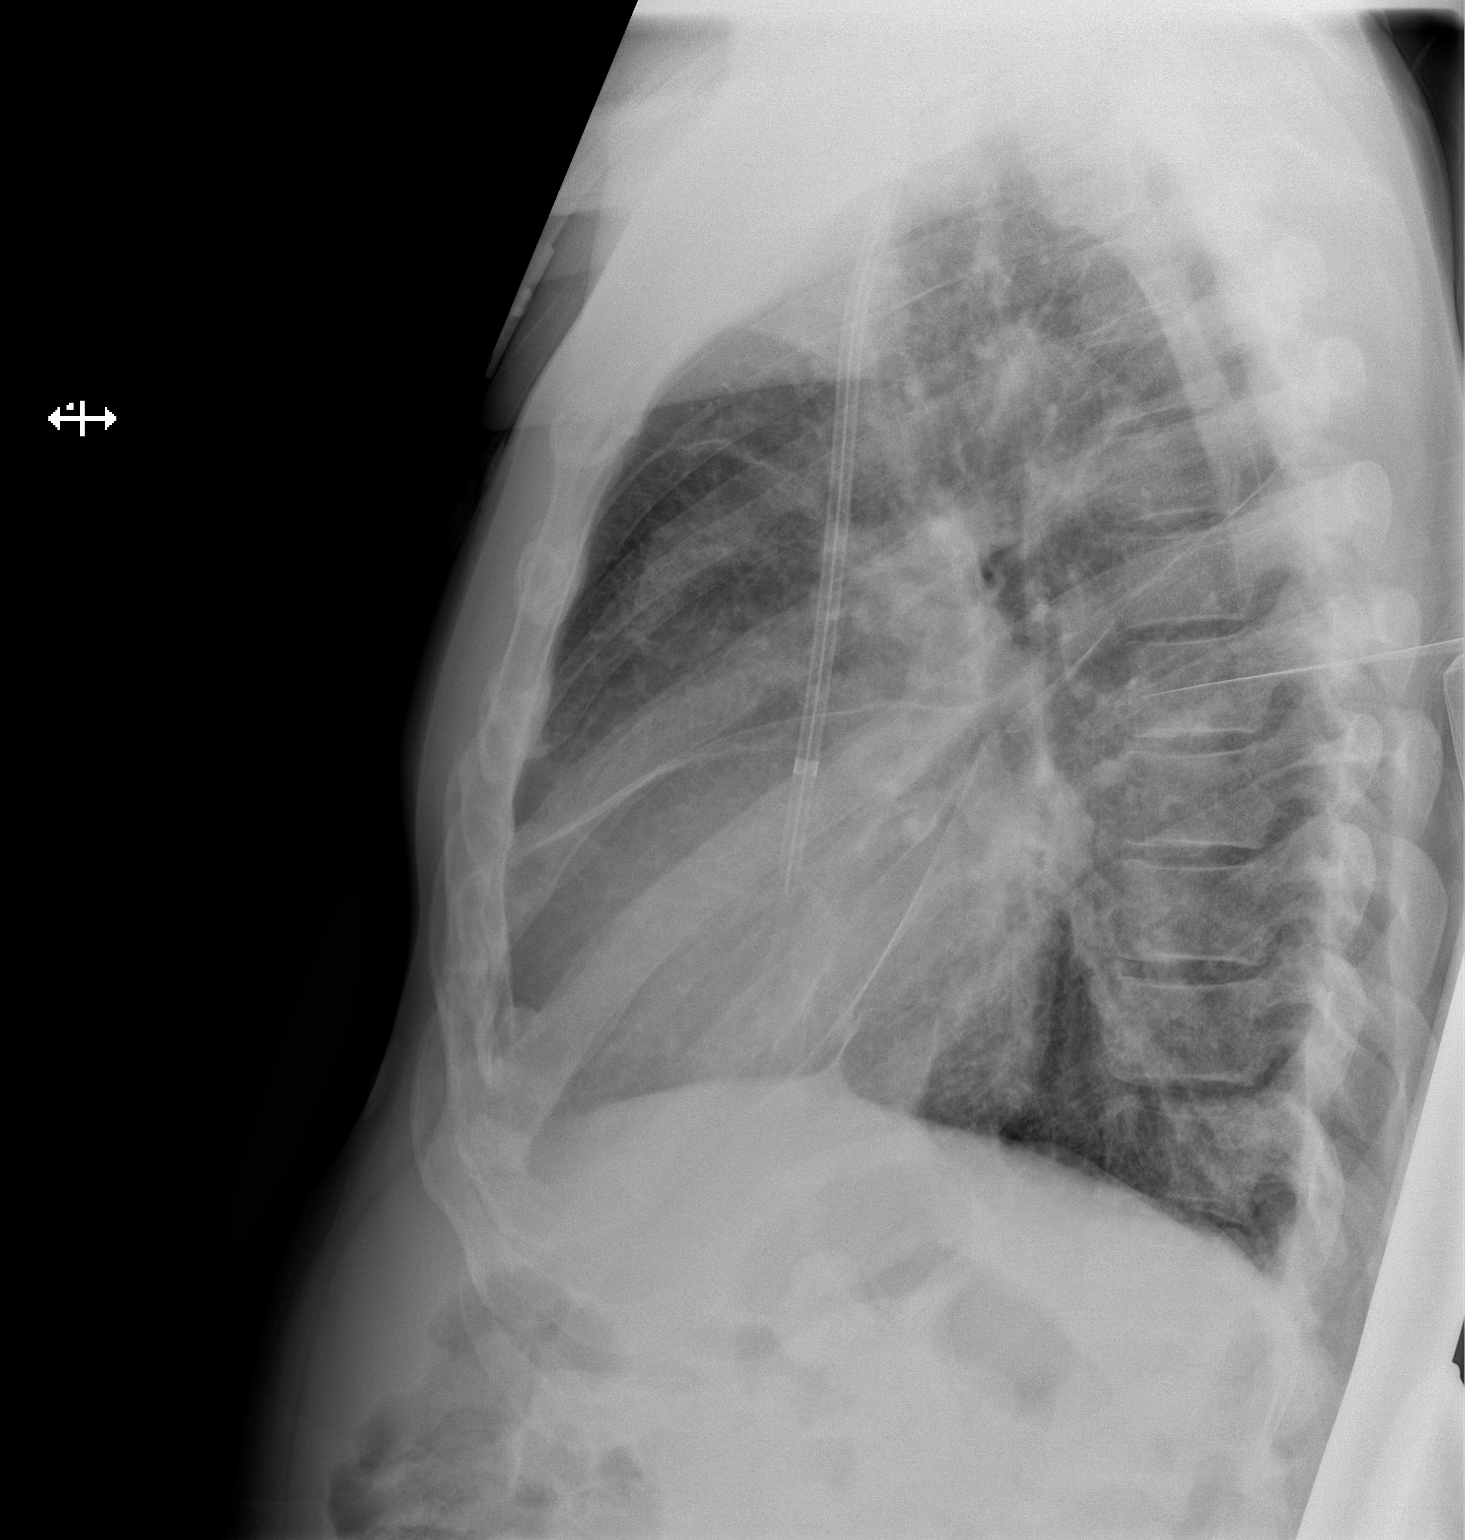

[x chest ap]
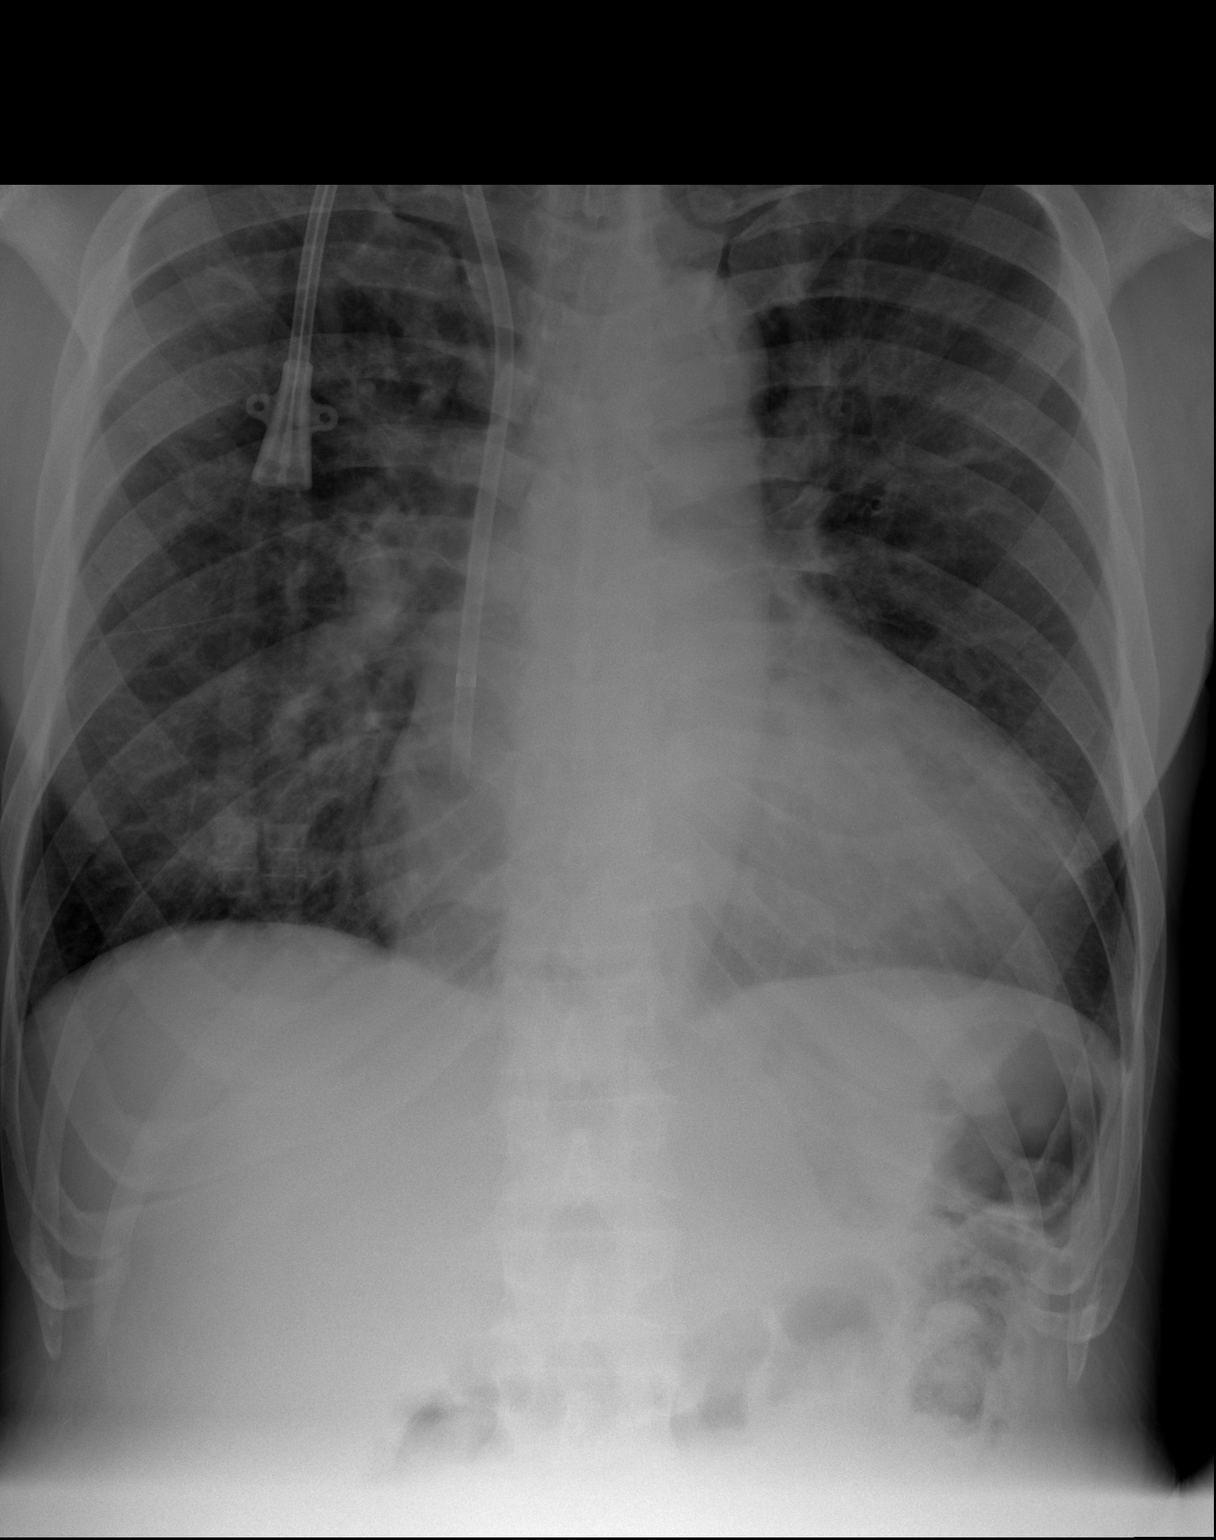

[2 of 2 positions shown; findings below may reference images not displayed]

FINDINGS: The lungs are well-aerated. Vascular congestion is noted. Mild
bilateral central opacities may reflect underlying chronic
interstitial lung disease or recurrent interstitial edema. No
definite pleural effusion or pneumothorax is seen.

The heart is mildly enlarged. A right-sided dual-lumen catheter is
noted ending about the cavoatrial junction. No acute osseous
abnormalities are seen.
IMPRESSION: Vascular congestion and mild cardiomegaly noted. Mild bilateral
central airspace opacities reflect may reflect underlying chronic
interstitial lung disease or recurrent interstitial edema.

## 2016-05-02 MED ORDER — ONDANSETRON 4 MG PO TBDP: ORAL_TABLET | ORAL | 3 refills | Status: DC

## 2016-05-02 NOTE — Patient Instructions (Signed)
End-Stage Kidney Disease The kidneys are two organs that lie on either side of the spine between the middle of the back and the front of the abdomen. The kidneys:   Remove wastes and extra water from the blood.   Produce important hormones. These help keep bones strong, regulate blood pressure, and help create red blood cells.   Balance the fluids and chemicals in the blood and tissues. End-stage kidney disease occurs when the kidneys are so damaged that they cannot do their job. When the kidneys cannot do their job, life-threatening problems occur. The body cannot stay clean and strong without the help of the kidneys. In end-stage kidney disease, the kidneys cannot get better.You need a new kidney or treatments to do some of the work healthy kidneys do in order to stay alive. CAUSES  End-stage kidney disease usually occurs when a long-lasting (chronic) kidney disease gets worse. It may also occur after the kidneys are suddenly damaged (acute kidney injury).  SYMPTOMS   Swelling (edema) of the legs, ankles, or feet.   Tiredness (lethargy).   Nausea or vomiting.   Confusion.   Problems with urination, such as:   Decreased urine production.   Frequent urination, especially at night.   Frequent accidents in children who are potty trained.   Muscle twitches and cramps.   Persistent itchiness.   Loss of appetite.   Headaches.   Abnormally dark or light skin.   Numbness in the hands or feet.   Easy bruising.   Frequent hiccups.   Menstruation stops. DIAGNOSIS  Your health care provider will measure your blood pressure and take some tests. These may include:   Urine tests.   Blood tests.   Imaging tests, such as:   An ultrasound exam.   Computed tomography (CT).  A kidney biopsy. TREATMENT  There are two treatments for end-stage kidney disease:   A procedure that removes toxic wastes from the body (dialysis).   Receiving a new kidney  (kidney transplant). Both of these treatments have serious risks and consequences. Your health care provider will help you determine which treatment is best for you based on your health, age, and other factors. In addition to having dialysis or a kidney transplant, you may need to take medicines to control high blood pressure (hypertension) and cholesterol and to decrease phosphorus levels in your blood.  HOME CARE INSTRUCTIONS  Follow your prescribed diet.   Take medicines only as directed by your health care provider.   Do not take any new medicines (prescription, over-the-counter, or nutritional supplements) unless approved by your health care provider. Many medicines can worsen your kidney damage or need to have the dose adjusted.   Keep all follow-up visits as directed by your health care provider. MAKE SURE YOU:  Understand these instructions.  Will watch your condition.  Will get help right away if you are not doing well or get worse.   This information is not intended to replace advice given to you by your health care provider. Make sure you discuss any questions you have with your health care provider.   Document Released: 09/28/2003 Document Revised: 07/29/2014 Document Reviewed: 03/06/2012 Elsevier Interactive Patient Education [employer].

## 2016-05-02 NOTE — Progress Notes (Signed)
Patient is here for HFU  Patient denies pain at this time.  Patient has taken medication today. Patient has eaten today.

## 2016-05-02 NOTE — Progress Notes (Signed)
Frank [name], is a 50 y.o. male  WUJ:[id number]  NFA:[id number]  DOB - 03-14-1966  Chief Complaint  Patient presents with  . Hospitalization Follow-up      Subjective:   Frank [name] is a 50 y.o. male with complex medical history including hypertension, diabetes mellitus, end-stage renal disease on hemodialysis, failed kidney transplant, chronic pain syndrome, RCC being followed by urologist for possible Nephrectomy, generalized anxiety and depression, multiple ED visits and hospitalizations for emergent HD and other related complications here today for a hospital discharge follow up visit and to reestablish medical care after over a year of being lost to follow up. Patient needs referral to pain clinic for chronic pain, he also need a nephrologist as he is been fired from his dialysis center in [place] and he needs one here in [place]. Patient is currently without a hemodialysis center, and has been going to numerous local ERs for his dialysis needs. He has no new complaint today. He needs refill of Zofran for his nausea after dialysis session. Patient has No headache, No chest pain, No abdominal pain, No new weakness tingling or numbness, No Cough - SOB.  Problem  Chronic Pain Syndrome  Essential Hypertension, Malignant   ALLERGIES: Allergies  Allergen Reactions  . Butalbital-Apap-Caffeine Shortness Of Breath and Swelling    Swelling in throat  . Ferrlecit [Na Ferric Gluc Cplx In Sucrose] Shortness Of Breath, Swelling and Other (See Comments)    Swelling in throat  . Minoxidil Shortness Of Breath  . Darvocet [Propoxyphene N-Acetaminophen] Hives  . Other Hives    Allergy listed on file from [place] (Pt did not mention any allergies) Pt able to do norco (tylenol)    PAST MEDICAL HISTORY: Past Medical History:  Diagnosis Date  . Anemia   . Anxiety   . Chronic combined systolic and diastolic CHF (congestive heart failure) (HCC)    a. EF 20-25% by echo in 08/2015 b. echo  10/2015: EF 35-40%, diffuse HK, severe LAE, moderate RAE, small pericardial effusion  . Complication of anesthesia    itching, sore throat  . Depression   . Dialysis patient ([place])   . ESRD (end stage renal disease) ([place])    due to HTN per patient, followed at [facility], s/p failed kidney transplant - dialysis Tue, Th, Sat  . Hyperkalemia 12/2015  . Hypertension   . Junctional rhythm    a. noted in 08/2015: hyperkalemic at that time  b. 12/2015: presented in junctional rhythm w/ K+ of 6.6. Resolved with improvement of K+ levels.  . Nonischemic cardiomyopathy ([place])    a. 08/2014: cath showing minimal CAD, but tortuous arteries noted.   . Renal insufficiency   . Shortness of breath   . Type II diabetes mellitus (HCC)    No history per patient, but remains under history as A1c would not be accurate given on dialysis    MEDICATIONS AT HOME: Prior to Admission medications   Medication Sig Start Date End Date Taking? Authorizing Provider  allopurinol (ZYLOPRIM) 100 MG tablet Take 100 mg by mouth daily.   Yes Historical Provider, MD  amLODipine (NORVASC) 10 MG tablet Take 10 mg by mouth daily.   Yes Historical Provider, MD  atorvastatin (LIPITOR) 40 MG tablet Take 1 tablet (40 mg total) by mouth daily at 6 PM. 09/02/14  Yes Barton Dubois, MD  AURYXIA 1 GM 210 MG(Fe) TABS Take 420 mg by mouth 3 (three) times daily. 01/26/16  Yes Historical Provider, MD  carvedilol (COREG) 25 MG  tablet Take 25 mg by mouth 2 (two) times daily with a meal.   Yes Historical Provider, MD  cinacalcet (SENSIPAR) 30 MG tablet Take 30 mg by mouth daily.   Yes Historical Provider, MD  cloNIDine (CATAPRES - DOSED IN MG/24 HR) 0.3 mg/24hr patch Place 0.3 mg onto the skin once a week.   Yes Historical Provider, MD  doxazosin (CARDURA) 2 MG tablet Take 2 mg by mouth daily.   Yes Historical Provider, MD  famotidine (PEPCID) 20 MG tablet Take 1 tablet (20 mg total) by mouth 2 (two) times daily. 02/13/16  Yes Charlesetta Shanks, MD    gabapentin (NEURONTIN) 100 MG capsule Take 100 mg by mouth 3 (three) times daily.   Yes Historical Provider, MD  hydrALAZINE (APRESOLINE) 100 MG tablet Take 100 mg by mouth every 8 (eight) hours.   Yes Historical Provider, MD  isosorbide mononitrate (IMDUR) 60 MG 24 hr tablet Take 1 tablet (60 mg total) by mouth daily. 01/09/16  Yes Smiley Houseman, MD  omeprazole (PRILOSEC) 20 MG capsule Take 20 mg by mouth daily. 07/01/13  Yes Historical Provider, MD  ondansetron (ZOFRAN ODT) 4 MG disintegrating tablet 19m ODT q4 hours prn nausea/vomit 05/02/16  Yes Omayra Tulloch E JDoreene Burke MD  predniSONE (DELTASONE) 10 MG tablet Take 10 mg by mouth daily with breakfast.   Yes Historical Provider, MD  promethazine (PHENERGAN) 25 MG tablet Take 1 tablet (25 mg total) by mouth every 8 (eight) hours as needed for nausea or vomiting. 02/11/16  Yes CDalia Heading PA-C  sevelamer carbonate (RENVELA) 800 MG tablet Take 800-1,600 mg by mouth 3 (three) times daily with meals. 2 tabs three times daily with meals, and 1 tablet with snacks   Yes Historical Provider, MD  warfarin (COUMADIN) 2 MG tablet Take 2 mg by mouth daily. Take with 549mtablet to equal 70m60m/12/17  Yes Historical Provider, MD  warfarin (COUMADIN) 5 MG tablet Take 5 mg by mouth daily. Take with 2mg65mblet to equal 70mg 66m2/17  Yes Historical Provider, MD    Objective:   Vitals:   05/02/16 1536  BP: 103/62  Pulse: 85  Resp: 18  Temp: 98.4 F (36.9 C)  TempSrc: Oral  SpO2: 99%  Weight: 162 lb (73.5 kg)  Height: _0  (1.88 m)   Exam General appearance : Awake, alert, not in any distress. Speech Clear. Not toxic looking HEENT: Atraumatic and Normocephalic, pupils equally reactive to light and accomodation Neck: Supple, no JVD. No cervical lymphadenopathy.  Chest: Good air entry bilaterally, no added sounds  CVS: S1 S2 regular, no murmurs.  Abdomen: Bowel sounds present, Non tender and not distended with no gaurding, rigidity or  rebound. Extremities: B/L Lower Ext shows no edema, both legs are warm to touch Neurology: Awake alert, and oriented X 3, CN II-XII intact, Non focal Skin: No Rash  Data Review Lab Results  Component Value Date   HGBA1C 5.4 02/11/2015   HGBA1C 5.9 (H) 01/31/2015   HGBA1C 5.70 10/26/2014    Assessment & Plan   1. ESRD on dialysis (HCC)  - ondansetron (ZOFRAN ODT) 4 MG disintegrating tablet; 4mg [id number] hours prn nausea/vomit  Dispense: 30 tablet; Refill: 3   - Ambulatory referral to Nephrology  2. Essential hypertension  We have discussed target BP range and blood pressure goal. I have advised patient to check BP regularly and to call us bac[place]oreaor report to clinic if the numbers are consistently higher than 140/90. We discussed the importance of  compliance with medical therapy and DASH diet recommended, consequences of uncontrolled hypertension discussed.  - continue current BP medications  3. Chronic pain syndrome  - Ambulatory referral to Pain Clinic  4. Left renal mass  - Ambulatory referral to Urology - Ambulatory referral to Nephrology  Patient have been counseled extensively about nutrition and exercise. Other issues discussed during this visit include: low cholesterol diet, weight control and daily exercise, foot care, annual eye examinations at Ophthalmology, importance of adherence with medications and regular follow-up. We also discussed long term complications of uncontrolled diabetes and hypertension.   Return in about 4 weeks (around 05/30/2016) for CKD/ESRD, Follow up HTN, Generalized Anxiety Disorder.  The patient was given clear instructions to go to ER or return to medical center if symptoms don't improve, worsen or new problems develop. The patient verbalized understanding. The patient was told to call to get lab results if they haven't heard anything in the next week.   This note has been created with Automotive engineer. Any transcriptional errors are unintentional.    Angelica Chessman, MD, [place], [place], [place], [place] and [place], [address]   05/02/2016, 4:28 PM

## 2016-05-04 DIAGNOSIS — K219 Gastro-esophageal reflux disease without esophagitis: Secondary | ICD-10-CM | POA: Diagnosis not present

## 2016-05-04 DIAGNOSIS — Z87891 Personal history of nicotine dependence: Secondary | ICD-10-CM | POA: Diagnosis not present

## 2016-05-04 DIAGNOSIS — I517 Cardiomegaly: Secondary | ICD-10-CM | POA: Diagnosis not present

## 2016-05-04 DIAGNOSIS — N186 End stage renal disease: Secondary | ICD-10-CM | POA: Diagnosis not present

## 2016-05-04 DIAGNOSIS — Z7901 Long term (current) use of anticoagulants: Secondary | ICD-10-CM | POA: Diagnosis not present

## 2016-05-04 DIAGNOSIS — Z992 Dependence on renal dialysis: Secondary | ICD-10-CM | POA: Diagnosis not present

## 2016-05-04 DIAGNOSIS — I509 Heart failure, unspecified: Secondary | ICD-10-CM | POA: Diagnosis not present

## 2016-05-04 DIAGNOSIS — E875 Hyperkalemia: Secondary | ICD-10-CM | POA: Diagnosis not present

## 2016-05-04 DIAGNOSIS — G4733 Obstructive sleep apnea (adult) (pediatric): Secondary | ICD-10-CM | POA: Diagnosis not present

## 2016-05-04 DIAGNOSIS — I132 Hypertensive heart and chronic kidney disease with heart failure and with stage 5 chronic kidney disease, or end stage renal disease: Secondary | ICD-10-CM | POA: Diagnosis not present

## 2016-05-05 DIAGNOSIS — N186 End stage renal disease: Secondary | ICD-10-CM | POA: Diagnosis not present

## 2016-05-05 DIAGNOSIS — I120 Hypertensive chronic kidney disease with stage 5 chronic kidney disease or end stage renal disease: Secondary | ICD-10-CM | POA: Diagnosis not present

## 2016-05-05 DIAGNOSIS — I517 Cardiomegaly: Secondary | ICD-10-CM | POA: Diagnosis not present

## 2016-05-05 DIAGNOSIS — E8809 Other disorders of plasma-protein metabolism, not elsewhere classified: Secondary | ICD-10-CM | POA: Diagnosis not present

## 2016-05-05 DIAGNOSIS — Z79899 Other long term (current) drug therapy: Secondary | ICD-10-CM | POA: Diagnosis not present

## 2016-05-05 DIAGNOSIS — Z992 Dependence on renal dialysis: Secondary | ICD-10-CM | POA: Diagnosis not present

## 2016-05-05 DIAGNOSIS — D649 Anemia, unspecified: Secondary | ICD-10-CM | POA: Diagnosis not present

## 2016-05-05 DIAGNOSIS — E875 Hyperkalemia: Secondary | ICD-10-CM | POA: Diagnosis not present

## 2016-05-06 DIAGNOSIS — R0602 Shortness of breath: Secondary | ICD-10-CM | POA: Diagnosis not present

## 2016-05-06 DIAGNOSIS — I120 Hypertensive chronic kidney disease with stage 5 chronic kidney disease or end stage renal disease: Secondary | ICD-10-CM | POA: Diagnosis not present

## 2016-05-06 DIAGNOSIS — E875 Hyperkalemia: Secondary | ICD-10-CM | POA: Diagnosis not present

## 2016-05-06 DIAGNOSIS — D631 Anemia in chronic kidney disease: Secondary | ICD-10-CM | POA: Diagnosis not present

## 2016-05-06 DIAGNOSIS — Z9115 Patient's noncompliance with renal dialysis: Secondary | ICD-10-CM | POA: Diagnosis not present

## 2016-05-06 DIAGNOSIS — T8612 Kidney transplant failure: Secondary | ICD-10-CM | POA: Diagnosis not present

## 2016-05-06 DIAGNOSIS — Z87891 Personal history of nicotine dependence: Secondary | ICD-10-CM | POA: Diagnosis not present

## 2016-05-06 DIAGNOSIS — Z9114 Patient's other noncompliance with medication regimen: Secondary | ICD-10-CM | POA: Diagnosis not present

## 2016-05-06 DIAGNOSIS — Z992 Dependence on renal dialysis: Secondary | ICD-10-CM | POA: Diagnosis not present

## 2016-05-06 DIAGNOSIS — Z86711 Personal history of pulmonary embolism: Secondary | ICD-10-CM | POA: Diagnosis not present

## 2016-05-06 DIAGNOSIS — E8770 Fluid overload, unspecified: Secondary | ICD-10-CM | POA: Diagnosis not present

## 2016-05-06 DIAGNOSIS — G4733 Obstructive sleep apnea (adult) (pediatric): Secondary | ICD-10-CM | POA: Diagnosis not present

## 2016-05-06 DIAGNOSIS — Z7901 Long term (current) use of anticoagulants: Secondary | ICD-10-CM | POA: Diagnosis not present

## 2016-05-06 DIAGNOSIS — N186 End stage renal disease: Secondary | ICD-10-CM | POA: Diagnosis not present

## 2016-05-06 DIAGNOSIS — K219 Gastro-esophageal reflux disease without esophagitis: Secondary | ICD-10-CM | POA: Diagnosis not present

## 2016-05-06 DIAGNOSIS — Z86718 Personal history of other venous thrombosis and embolism: Secondary | ICD-10-CM | POA: Diagnosis not present

## 2016-05-06 DIAGNOSIS — Z940 Kidney transplant status: Secondary | ICD-10-CM | POA: Diagnosis not present

## 2016-05-06 DIAGNOSIS — T8611 Kidney transplant rejection: Secondary | ICD-10-CM | POA: Diagnosis not present

## 2016-05-06 DIAGNOSIS — I132 Hypertensive heart and chronic kidney disease with heart failure and with stage 5 chronic kidney disease, or end stage renal disease: Secondary | ICD-10-CM | POA: Diagnosis not present

## 2016-05-07 DIAGNOSIS — T8612 Kidney transplant failure: Secondary | ICD-10-CM | POA: Diagnosis not present

## 2016-05-07 DIAGNOSIS — Z86711 Personal history of pulmonary embolism: Secondary | ICD-10-CM | POA: Diagnosis not present

## 2016-05-07 DIAGNOSIS — Z992 Dependence on renal dialysis: Secondary | ICD-10-CM | POA: Diagnosis not present

## 2016-05-07 DIAGNOSIS — R0602 Shortness of breath: Secondary | ICD-10-CM | POA: Diagnosis not present

## 2016-05-07 DIAGNOSIS — Z7901 Long term (current) use of anticoagulants: Secondary | ICD-10-CM | POA: Diagnosis not present

## 2016-05-07 DIAGNOSIS — E8770 Fluid overload, unspecified: Secondary | ICD-10-CM | POA: Diagnosis not present

## 2016-05-07 DIAGNOSIS — E875 Hyperkalemia: Secondary | ICD-10-CM | POA: Diagnosis not present

## 2016-05-07 DIAGNOSIS — N186 End stage renal disease: Secondary | ICD-10-CM | POA: Diagnosis not present

## 2016-05-07 DIAGNOSIS — I120 Hypertensive chronic kidney disease with stage 5 chronic kidney disease or end stage renal disease: Secondary | ICD-10-CM | POA: Diagnosis not present

## 2016-05-07 DIAGNOSIS — Z79899 Other long term (current) drug therapy: Secondary | ICD-10-CM | POA: Diagnosis not present

## 2016-05-07 DIAGNOSIS — Z86718 Personal history of other venous thrombosis and embolism: Secondary | ICD-10-CM | POA: Diagnosis not present

## 2016-05-07 DIAGNOSIS — E8809 Other disorders of plasma-protein metabolism, not elsewhere classified: Secondary | ICD-10-CM | POA: Diagnosis not present

## 2016-05-07 DIAGNOSIS — Z9115 Patient's noncompliance with renal dialysis: Secondary | ICD-10-CM | POA: Diagnosis not present

## 2016-05-07 DIAGNOSIS — T8611 Kidney transplant rejection: Secondary | ICD-10-CM | POA: Diagnosis not present

## 2016-05-10 DIAGNOSIS — Z940 Kidney transplant status: Secondary | ICD-10-CM | POA: Diagnosis not present

## 2016-05-10 DIAGNOSIS — E8809 Other disorders of plasma-protein metabolism, not elsewhere classified: Secondary | ICD-10-CM | POA: Diagnosis not present

## 2016-05-10 DIAGNOSIS — E1122 Type 2 diabetes mellitus with diabetic chronic kidney disease: Secondary | ICD-10-CM | POA: Diagnosis not present

## 2016-05-10 DIAGNOSIS — G4733 Obstructive sleep apnea (adult) (pediatric): Secondary | ICD-10-CM | POA: Diagnosis not present

## 2016-05-10 DIAGNOSIS — I509 Heart failure, unspecified: Secondary | ICD-10-CM | POA: Diagnosis not present

## 2016-05-10 DIAGNOSIS — Z7901 Long term (current) use of anticoagulants: Secondary | ICD-10-CM | POA: Diagnosis not present

## 2016-05-10 DIAGNOSIS — I517 Cardiomegaly: Secondary | ICD-10-CM | POA: Diagnosis not present

## 2016-05-10 DIAGNOSIS — D631 Anemia in chronic kidney disease: Secondary | ICD-10-CM | POA: Diagnosis not present

## 2016-05-10 DIAGNOSIS — I132 Hypertensive heart and chronic kidney disease with heart failure and with stage 5 chronic kidney disease, or end stage renal disease: Secondary | ICD-10-CM | POA: Diagnosis not present

## 2016-05-10 DIAGNOSIS — E875 Hyperkalemia: Secondary | ICD-10-CM | POA: Diagnosis not present

## 2016-05-10 DIAGNOSIS — K219 Gastro-esophageal reflux disease without esophagitis: Secondary | ICD-10-CM | POA: Diagnosis not present

## 2016-05-10 DIAGNOSIS — I120 Hypertensive chronic kidney disease with stage 5 chronic kidney disease or end stage renal disease: Secondary | ICD-10-CM | POA: Diagnosis not present

## 2016-05-10 DIAGNOSIS — Z79899 Other long term (current) drug therapy: Secondary | ICD-10-CM | POA: Diagnosis not present

## 2016-05-10 DIAGNOSIS — N186 End stage renal disease: Secondary | ICD-10-CM | POA: Diagnosis not present

## 2016-05-10 DIAGNOSIS — Z87891 Personal history of nicotine dependence: Secondary | ICD-10-CM | POA: Diagnosis not present

## 2016-05-10 DIAGNOSIS — Z992 Dependence on renal dialysis: Secondary | ICD-10-CM | POA: Diagnosis not present

## 2016-05-11 IMAGING — CR DG CHEST 2V
1 series · 1 of 1 positions shown · non-contrast
Comparison: 12/12/2013

CLINICAL DATA: Chest pain.  Abdominal pain.

EXAM:
CHEST  2 VIEW

[w chest lat]
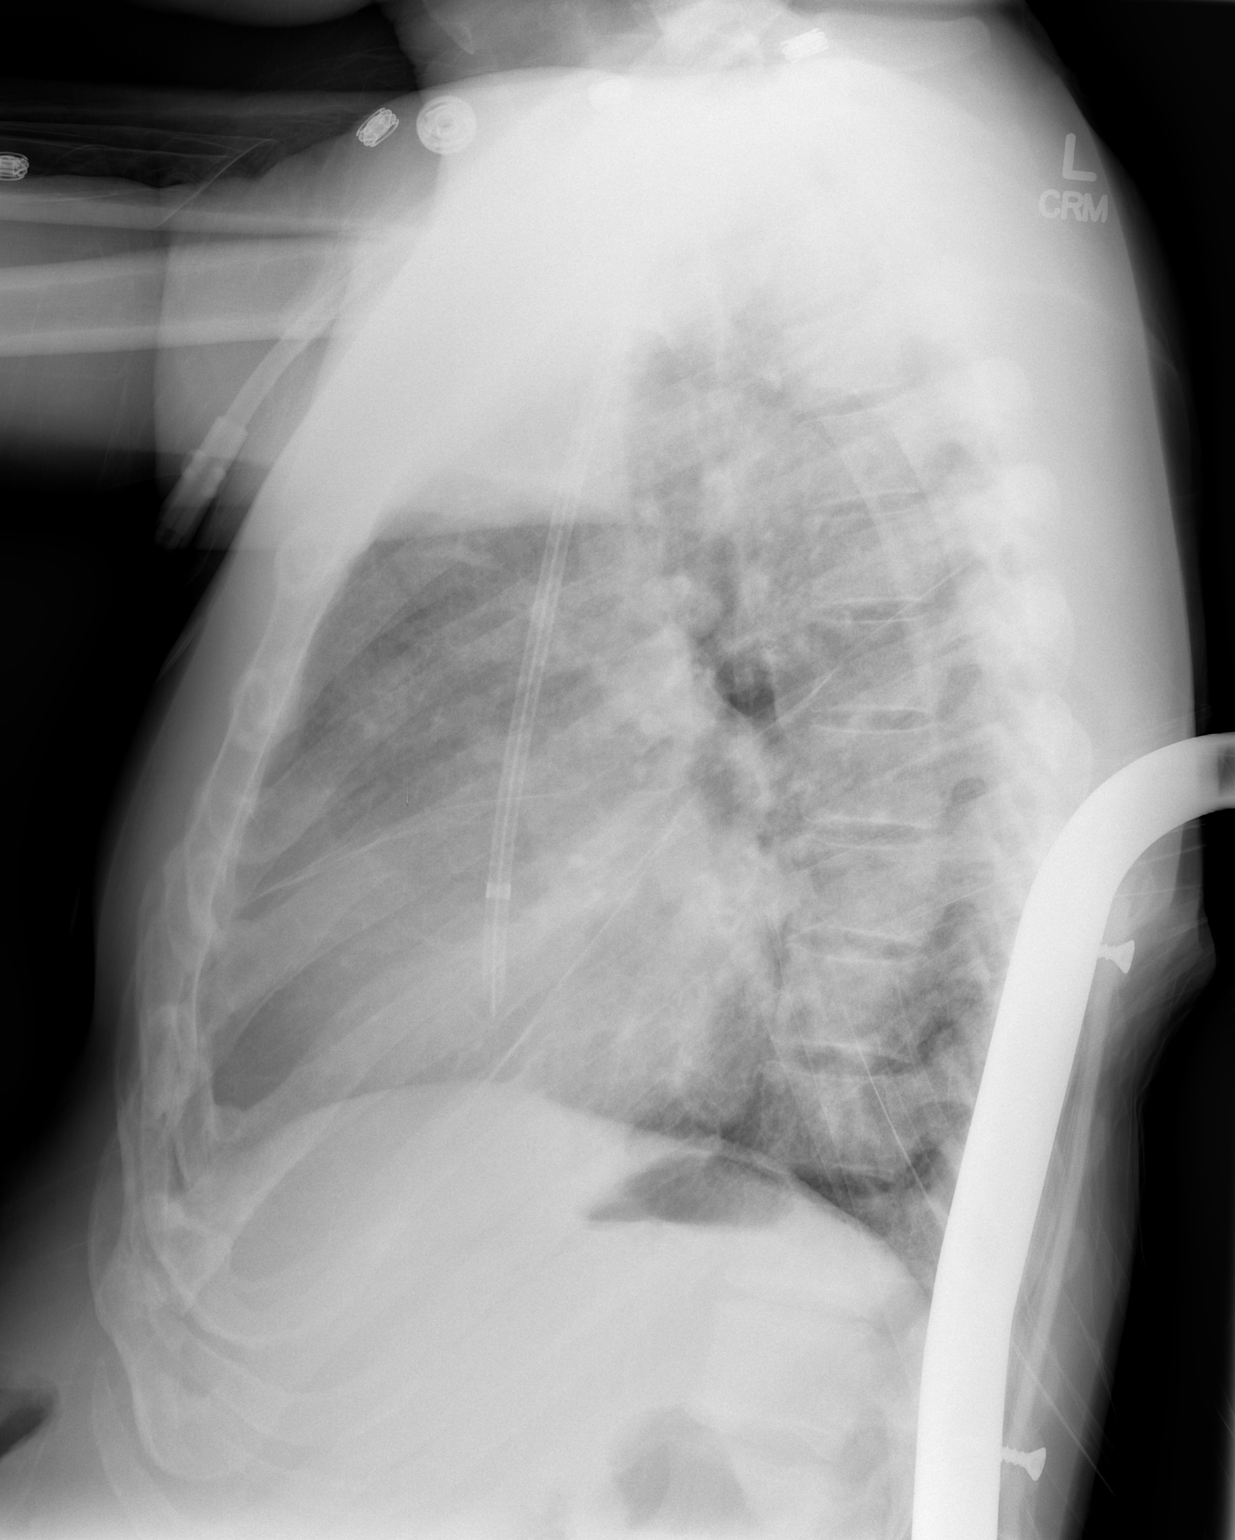

[1 of 1 positions shown; findings below may reference images not displayed]

FINDINGS: Right IJ dialysis catheter in stable position, tip at the upper
right atrial level. Unchanged cardiomegaly. Stable upper mediastinal
contours. Pulmonary venous congestion without edema, consolidation,
effusion, or pneumothorax.
IMPRESSION: Chronic cardiomegaly and venous congestion.

## 2016-05-11 IMAGING — CT CT ABD-PELV W/O CM
2 of 4 series · 16 of 46 positions shown, 18 images · non-contrast
Comparison: 10/31/2005

CLINICAL DATA: Abnormal labs.

EXAM:
CT ABDOMEN AND PELVIS WITHOUT CONTRAST
TECHNIQUE: Multidetector CT imaging of the abdomen and pelvis was performed
following the standard protocol without IV contrast.

[Series 2: abd/ pelvis 5.0 i30f 1 · axial · 0.72mm/px · z∈[-500,-110]mm · 13 of 86 slices shown, 15 images]
[im 4/86  soft-tissue]
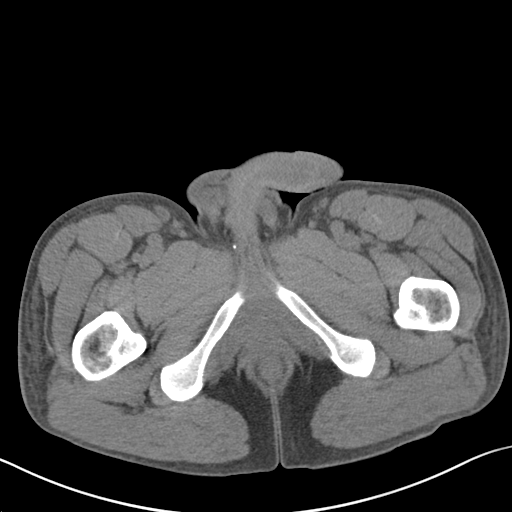
[im 4/86  bone]
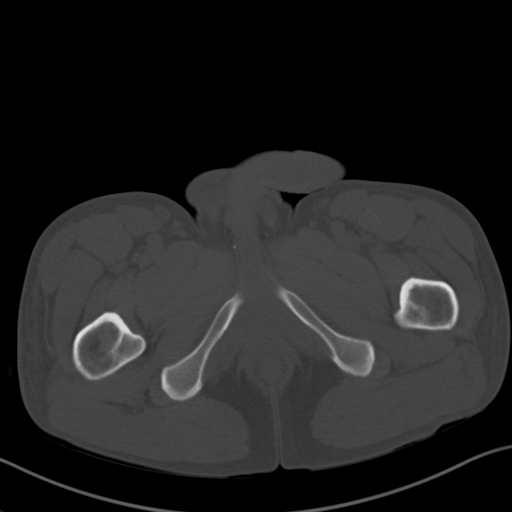
[im 11/86  soft-tissue]
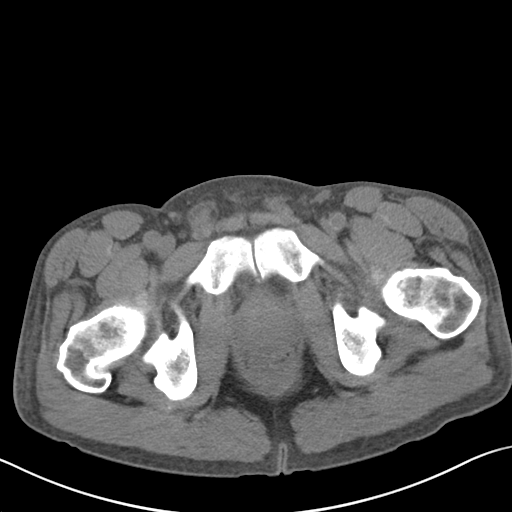
[im 18/86  soft-tissue]
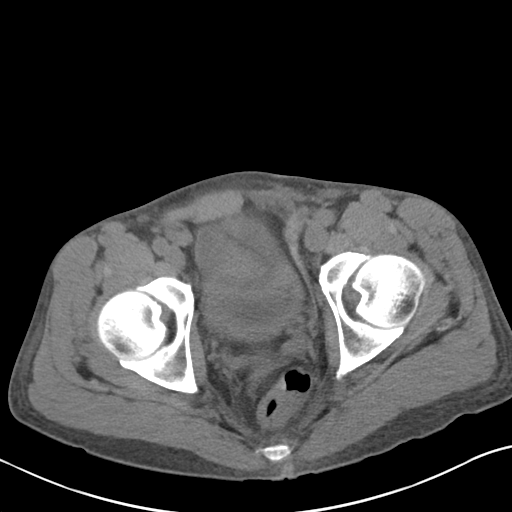
[im 24/86  soft-tissue]
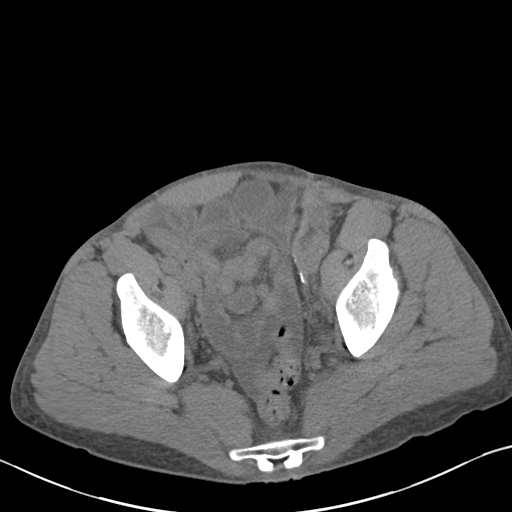
[im 31/86  soft-tissue]
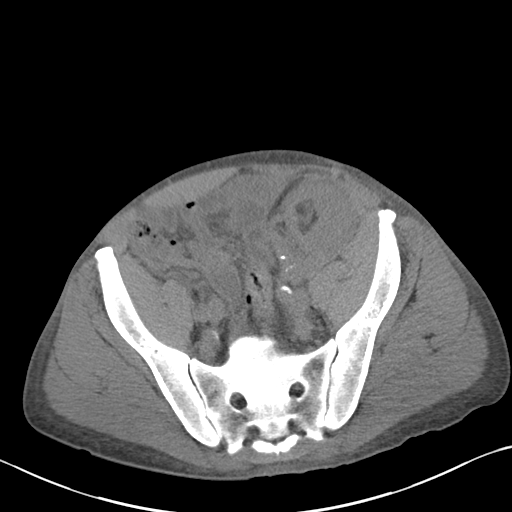
[im 38/86  soft-tissue]
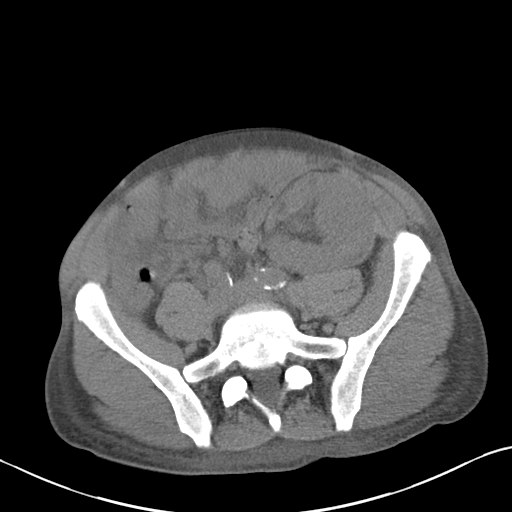
[im 45/86  soft-tissue]
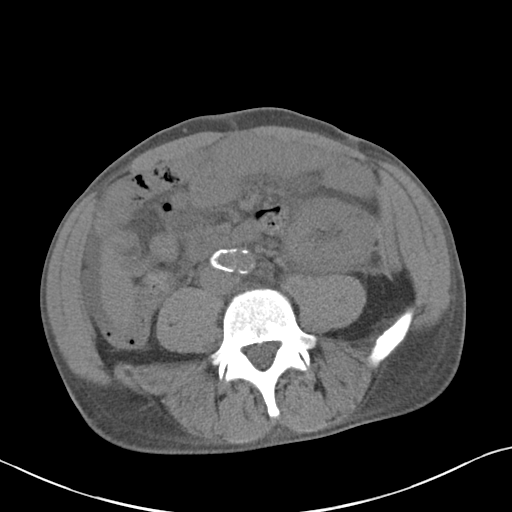
[im 48/86  soft-tissue]
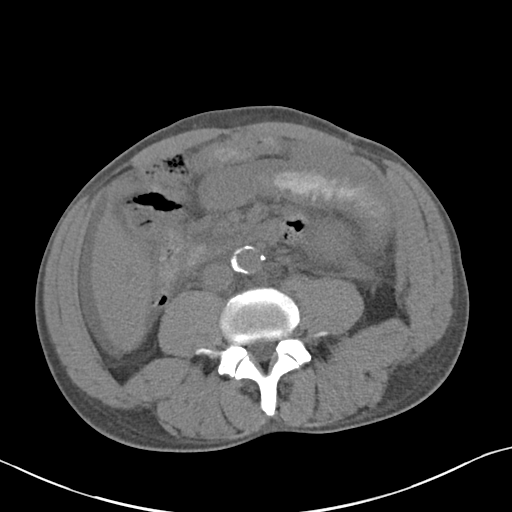
[im 55/86  soft-tissue]
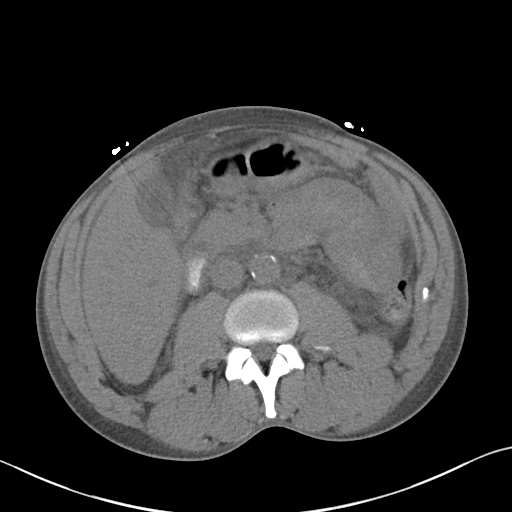
[im 55/86  bone]
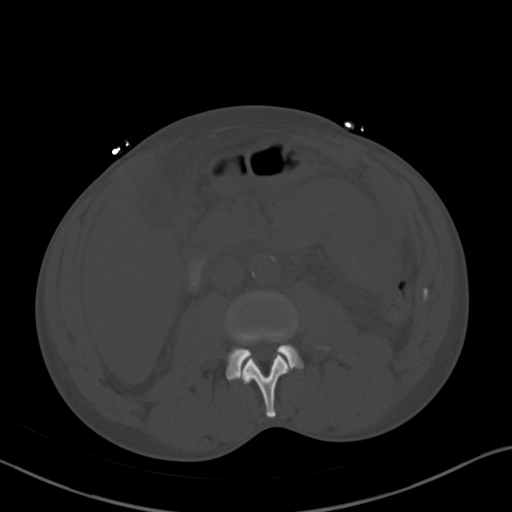
[im 62/86  soft-tissue]
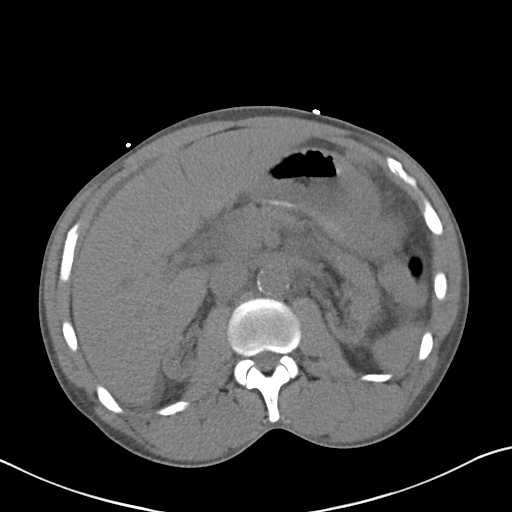
[im 69/86  soft-tissue]
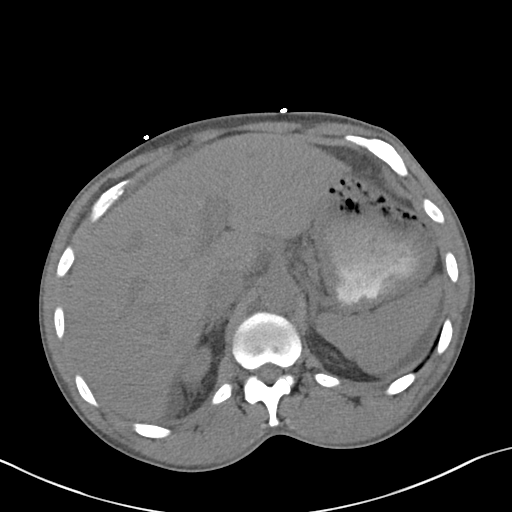
[im 75/86  soft-tissue]
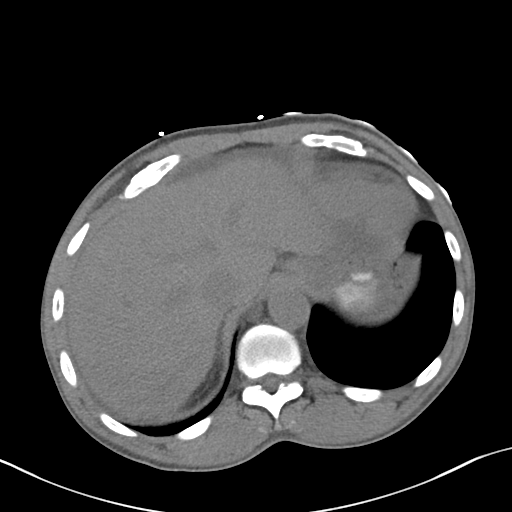
[im 82/86  soft-tissue]
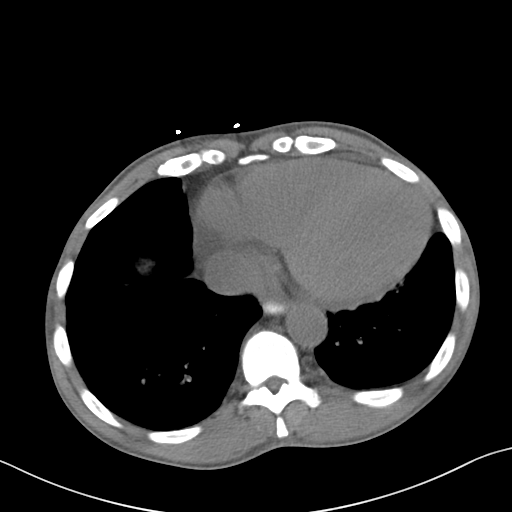

[Series 5: coronals · coronal · 0.77mm/px · 3 of 133 slices shown]
[im 45/133  soft-tissue]
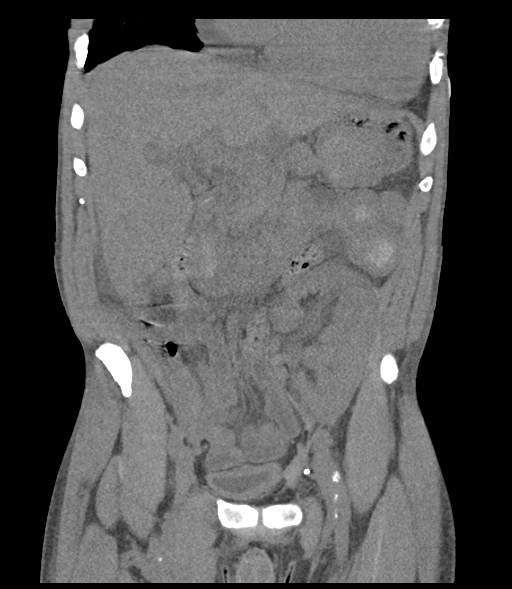
[im 59/133  soft-tissue]
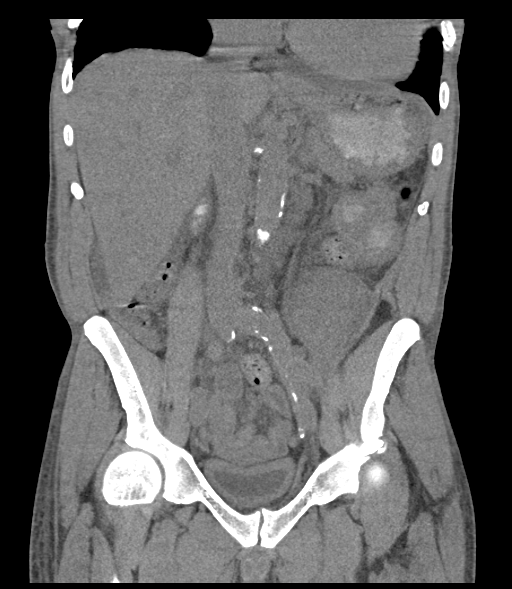
[im 74/133  soft-tissue]
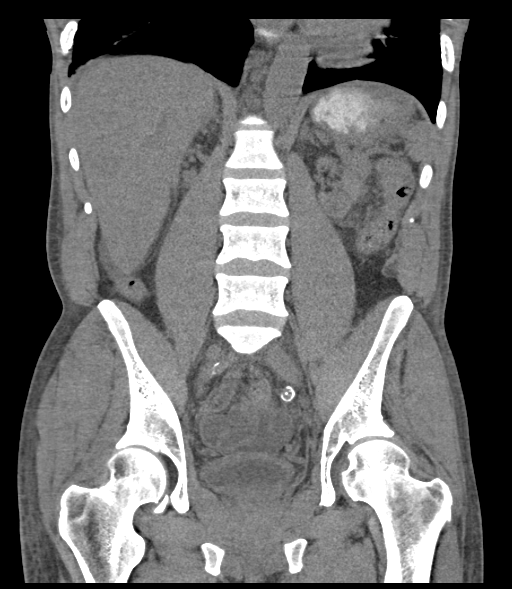

[16 of 46 positions shown; findings below may reference images not displayed]

FINDINGS: BODY WALL: Unremarkable.

LOWER CHEST: Cardiomegaly and trace pericardial effusion.

ABDOMEN/PELVIS:

Liver: No focal abnormality.

Biliary: Circumferential low-density gallbladder wall thickening
which is likely reactive to the patient's presumed volume overload.
No distention to suggest gallbladder obstruction.

Pancreas: Unremarkable.

Spleen: Unremarkable.

Adrenals: Unremarkable.

Kidneys and ureters: Atrophic native kidneys. There is a new soft
tissue density mass in the interpolar left kidney measuring 2.3 cm.
No hydronephrosis. There is a renal transplant in the left iliac
fossa. The transplant is presumably non functional given history of
current dialysis. Small low-attenuation area in anterior to lower
pole region could reflect cyst or site of previous biopsy. Linear
high density along the lateral and anterior aspect of the kidney
stable from previous and considered postsurgical. Stable mild
pelviectasis. No overt hydronephrosis.

Bladder: Minimal urine internally.

Reproductive: Unremarkable.

Bowel: There is a segment of proximal small bowel which has
circumferential thickening, fold thickening, and surrounding
mesenteric fat haziness.

Retroperitoneum: Diffuse retroperitoneal fluid, likely from the
volume overload. There is a rounded mass at the level of the left
renal hilum at 17 mm. This was not seen previously; it is presumably
a lymph node.

Peritoneum: .Small volume ascites distributed throughout the
abdomen.

Vascular: No acute abnormality.

OSSEOUS: No acute abnormalities.
IMPRESSION: 1. Proximal enteritis which is usually infectious, but could be
autoimmune or vascular.
2. 2.3 cm mass in the native left kidney. Renal cell carcinoma is
the diagnosis of exclusion, recommend outpatient noncontrast renal
MRI.
3. Left periaortic adenopathy, significance to be determined based
on workup of #2.

## 2016-05-12 DIAGNOSIS — Z992 Dependence on renal dialysis: Secondary | ICD-10-CM | POA: Diagnosis not present

## 2016-05-12 DIAGNOSIS — Z5321 Procedure and treatment not carried out due to patient leaving prior to being seen by health care provider: Secondary | ICD-10-CM | POA: Diagnosis not present

## 2016-05-12 DIAGNOSIS — M549 Dorsalgia, unspecified: Secondary | ICD-10-CM | POA: Diagnosis not present

## 2016-05-13 ENCOUNTER — Encounter (HOSPITAL_COMMUNITY): Payer: Self-pay

## 2016-05-13 ENCOUNTER — Observation Stay (HOSPITAL_COMMUNITY)
Admission: EM | Admit: 2016-05-13 | Discharge: 2016-05-15 | Disposition: A | Discharge status: Home / Self Care | Payer: Medicare Other | Attending: Internal Medicine | Admitting: Internal Medicine

## 2016-05-13 DIAGNOSIS — I5042 Chronic combined systolic (congestive) and diastolic (congestive) heart failure: Secondary | ICD-10-CM | POA: Diagnosis not present

## 2016-05-13 DIAGNOSIS — I132 Hypertensive heart and chronic kidney disease with heart failure and with stage 5 chronic kidney disease, or end stage renal disease: Secondary | ICD-10-CM | POA: Diagnosis not present

## 2016-05-13 DIAGNOSIS — G894 Chronic pain syndrome: Secondary | ICD-10-CM

## 2016-05-13 DIAGNOSIS — Z992 Dependence on renal dialysis: Secondary | ICD-10-CM | POA: Insufficient documentation

## 2016-05-13 DIAGNOSIS — I10 Essential (primary) hypertension: Secondary | ICD-10-CM

## 2016-05-13 DIAGNOSIS — D638 Anemia in other chronic diseases classified elsewhere: Secondary | ICD-10-CM | POA: Diagnosis not present

## 2016-05-13 DIAGNOSIS — I120 Hypertensive chronic kidney disease with stage 5 chronic kidney disease or end stage renal disease: Secondary | ICD-10-CM | POA: Diagnosis not present

## 2016-05-13 DIAGNOSIS — R112 Nausea with vomiting, unspecified: Secondary | ICD-10-CM | POA: Diagnosis not present

## 2016-05-13 DIAGNOSIS — E875 Hyperkalemia: Principal | ICD-10-CM | POA: Insufficient documentation

## 2016-05-13 DIAGNOSIS — Z79899 Other long term (current) drug therapy: Secondary | ICD-10-CM | POA: Diagnosis not present

## 2016-05-13 DIAGNOSIS — Z91199 Patient's noncompliance with other medical treatment and regimen due to unspecified reason: Secondary | ICD-10-CM

## 2016-05-13 DIAGNOSIS — N2581 Secondary hyperparathyroidism of renal origin: Secondary | ICD-10-CM | POA: Diagnosis not present

## 2016-05-13 DIAGNOSIS — N186 End stage renal disease: Secondary | ICD-10-CM | POA: Diagnosis not present

## 2016-05-13 DIAGNOSIS — D631 Anemia in chronic kidney disease: Secondary | ICD-10-CM | POA: Diagnosis not present

## 2016-05-13 DIAGNOSIS — Z87891 Personal history of nicotine dependence: Secondary | ICD-10-CM | POA: Insufficient documentation

## 2016-05-13 DIAGNOSIS — Z7901 Long term (current) use of anticoagulants: Secondary | ICD-10-CM | POA: Diagnosis not present

## 2016-05-13 DIAGNOSIS — R0602 Shortness of breath: Secondary | ICD-10-CM

## 2016-05-13 DIAGNOSIS — E1122 Type 2 diabetes mellitus with diabetic chronic kidney disease: Secondary | ICD-10-CM | POA: Insufficient documentation

## 2016-05-13 DIAGNOSIS — Z9119 Patient's noncompliance with other medical treatment and regimen: Secondary | ICD-10-CM

## 2016-05-13 LAB — COMPREHENSIVE METABOLIC PANEL
ALT: 13 U/L — AB (ref 17–63)
ANION GAP: 14 (ref 5–15)
AST: 36 U/L (ref 15–41)
Albumin: 3.5 g/dL (ref 3.5–5.0)
Alkaline Phosphatase: 135 U/L — ABNORMAL HIGH (ref 38–126)
BUN: 61 mg/dL — ABNORMAL HIGH (ref 6–20)
CHLORIDE: 100 mmol/L — AB (ref 101–111)
CO2: 23 mmol/L (ref 22–32)
Calcium: 8.8 mg/dL — ABNORMAL LOW (ref 8.9–10.3)
Creatinine, Ser: 16.63 mg/dL — ABNORMAL HIGH (ref 0.61–1.24)
GFR, EST AFRICAN AMERICAN: 3 mL/min — AB (ref >60)
GFR, EST NON AFRICAN AMERICAN: 3 mL/min — AB (ref >60)
Glucose, Bld: 130 mg/dL — ABNORMAL HIGH (ref 65–99)
POTASSIUM: 6.9 mmol/L — AB (ref 3.5–5.1)
SODIUM: 137 mmol/L (ref 135–145)
Total Bilirubin: 0.5 mg/dL (ref 0.3–1.2)
Total Protein: 7.8 g/dL (ref 6.5–8.1)

## 2016-05-13 LAB — CBC
HEMATOCRIT: 26.8 % — AB (ref 39.0–52.0)
HEMOGLOBIN: 8.4 g/dL — AB (ref 13.0–17.0)
MCH: 27.6 pg (ref 26.0–34.0)
MCHC: 31.3 g/dL (ref 30.0–36.0)
MCV: 88.2 fL (ref 78.0–100.0)
PLATELETS: 177 10*3/uL (ref 150–400)
RBC: 3.04 MIL/uL — AB (ref 4.22–5.81)
RDW: 15.7 % — ABNORMAL HIGH (ref 11.5–15.5)
WBC: 7.1 10*3/uL (ref 4.0–10.5)

## 2016-05-13 LAB — LIPASE, BLOOD: LIPASE: 54 U/L — AB (ref 11–51)

## 2016-05-13 MED ORDER — AMLODIPINE BESYLATE 5 MG PO TABS
10.0000 mg | ORAL_TABLET | Freq: Every day | ORAL | Status: DC
  Administered 2016-05-14: 10 mg via ORAL
  Filled 2016-05-13: qty 2

## 2016-05-13 MED ORDER — CARVEDILOL 12.5 MG PO TABS
25.0000 mg | ORAL_TABLET | Freq: Two times a day (BID) | ORAL | Status: DC
  Administered 2016-05-14 – 2016-05-15 (×2): 25 mg via ORAL
  Filled 2016-05-13 (×2): qty 2

## 2016-05-13 MED ORDER — ISOSORBIDE MONONITRATE ER 60 MG PO TB24
60.0000 mg | ORAL_TABLET | Freq: Every day | ORAL | Status: DC
Start: 2016-05-14 — End: 2016-05-15
  Administered 2016-05-14: 60 mg via ORAL
  Filled 2016-05-13: qty 1

## 2016-05-13 MED ORDER — GABAPENTIN 100 MG PO CAPS
100.0000 mg | ORAL_CAPSULE | Freq: Three times a day (TID) | ORAL | Status: DC
  Administered 2016-05-14 (×4): 100 mg via ORAL
  Filled 2016-05-13 (×4): qty 1

## 2016-05-13 MED ORDER — ACETAMINOPHEN 325 MG PO TABS: 650.0000 mg | ORAL_TABLET | Freq: Four times a day (QID) | ORAL | Status: DC | PRN

## 2016-05-13 MED ORDER — SEVELAMER CARBONATE 800 MG PO TABS
1600.0000 mg | ORAL_TABLET | Freq: Three times a day (TID) | ORAL | Status: DC
  Administered 2016-05-14 – 2016-05-15 (×2): 1600 mg via ORAL
  Filled 2016-05-13 (×2): qty 2

## 2016-05-13 MED ORDER — PREDNISONE 20 MG PO TABS
10.0000 mg | ORAL_TABLET | Freq: Every day | ORAL | Status: DC
Start: 2016-05-14 — End: 2016-05-15
  Administered 2016-05-14 – 2016-05-15 (×2): 10 mg via ORAL
  Filled 2016-05-13 (×2): qty 1

## 2016-05-13 MED ORDER — CINACALCET HCL 30 MG PO TABS
30.0000 mg | ORAL_TABLET | Freq: Every day | ORAL | Status: DC
  Administered 2016-05-14 – 2016-05-15 (×2): 30 mg via ORAL
  Filled 2016-05-13 (×2): qty 1

## 2016-05-13 MED ORDER — ONDANSETRON HCL 4 MG/2ML IJ SOLN: 4.0000 mg | Freq: Four times a day (QID) | INTRAMUSCULAR | Status: DC | PRN

## 2016-05-13 MED ORDER — PENTAFLUOROPROP-TETRAFLUOROETH EX AERO
1.0000 "application " | INHALATION_SPRAY | CUTANEOUS | Status: DC | PRN
  Filled 2016-05-13: qty 30

## 2016-05-13 MED ORDER — DOXAZOSIN MESYLATE 2 MG PO TABS
2.0000 mg | ORAL_TABLET | Freq: Every day | ORAL | Status: DC
  Administered 2016-05-14: 2 mg via ORAL
  Filled 2016-05-13 (×2): qty 1

## 2016-05-13 MED ORDER — HEPARIN SODIUM (PORCINE) 1000 UNIT/ML DIALYSIS
3000.0000 [IU] | INTRAMUSCULAR | Status: DC | PRN
  Filled 2016-05-13: qty 3

## 2016-05-13 MED ORDER — LIDOCAINE HCL (PF) 1 % IJ SOLN: 5.0000 mL | INTRAMUSCULAR | Status: DC | PRN

## 2016-05-13 MED ORDER — PANTOPRAZOLE SODIUM 40 MG PO TBEC
40.0000 mg | DELAYED_RELEASE_TABLET | Freq: Every day | ORAL | Status: DC
  Administered 2016-05-14: 40 mg via ORAL
  Filled 2016-05-13: qty 1

## 2016-05-13 MED ORDER — FAMOTIDINE 20 MG PO TABS
20.0000 mg | ORAL_TABLET | Freq: Two times a day (BID) | ORAL | Status: DC
  Administered 2016-05-14 (×3): 20 mg via ORAL
  Filled 2016-05-13 (×3): qty 1

## 2016-05-13 MED ORDER — INSULIN ASPART 100 UNIT/ML ~~LOC~~ SOLN
5.0000 [IU] | Freq: Once | SUBCUTANEOUS | Status: AC
  Administered 2016-05-13: 5 [IU] via INTRAVENOUS
  Filled 2016-05-13: qty 1

## 2016-05-13 MED ORDER — SODIUM CHLORIDE 0.9 % IV SOLN: 100.0000 mL | INTRAVENOUS | Status: DC | PRN

## 2016-05-13 MED ORDER — ATORVASTATIN CALCIUM 40 MG PO TABS
40.0000 mg | ORAL_TABLET | Freq: Every day | ORAL | Status: DC
  Administered 2016-05-14: 40 mg via ORAL
  Filled 2016-05-13: qty 1

## 2016-05-13 MED ORDER — ACETAMINOPHEN 650 MG RE SUPP: 650.0000 mg | Freq: Four times a day (QID) | RECTAL | Status: DC | PRN

## 2016-05-13 MED ORDER — CLONIDINE HCL 0.3 MG/24HR TD PTWK
0.3000 mg | MEDICATED_PATCH | TRANSDERMAL | Status: DC
  Filled 2016-05-13: qty 1

## 2016-05-13 MED ORDER — HYDRALAZINE HCL 50 MG PO TABS
100.0000 mg | ORAL_TABLET | Freq: Three times a day (TID) | ORAL | Status: DC
  Administered 2016-05-14 – 2016-05-15 (×3): 100 mg via ORAL
  Filled 2016-05-13 (×3): qty 2

## 2016-05-13 MED ORDER — SODIUM CHLORIDE 0.9 % IV SOLN
1.0000 g | Freq: Once | INTRAVENOUS | Status: AC
  Administered 2016-05-13: 1 g via INTRAVENOUS
  Filled 2016-05-13: qty 10

## 2016-05-13 MED ORDER — ALLOPURINOL 100 MG PO TABS
100.0000 mg | ORAL_TABLET | Freq: Every day | ORAL | Status: DC
  Administered 2016-05-14: 100 mg via ORAL
  Filled 2016-05-13: qty 1

## 2016-05-13 MED ORDER — DEXTROSE 50 % IV SOLN
1.0000 | Freq: Once | INTRAVENOUS | Status: AC
  Administered 2016-05-13: 50 mL via INTRAVENOUS
  Filled 2016-05-13: qty 50

## 2016-05-13 MED ORDER — LIDOCAINE-PRILOCAINE 2.5-2.5 % EX CREA
1.0000 "application " | TOPICAL_CREAM | CUTANEOUS | Status: DC | PRN
  Filled 2016-05-13: qty 5

## 2016-05-13 MED ORDER — ONDANSETRON HCL 4 MG PO TABS
4.0000 mg | ORAL_TABLET | Freq: Four times a day (QID) | ORAL | Status: DC | PRN
  Administered 2016-05-14 – 2016-05-15 (×2): 4 mg via ORAL
  Filled 2016-05-13 (×2): qty 1

## 2016-05-13 MED ORDER — ONDANSETRON HCL 4 MG/2ML IJ SOLN
4.0000 mg | Freq: Once | INTRAMUSCULAR | Status: AC
  Administered 2016-05-13: 4 mg via INTRAVENOUS
  Filled 2016-05-13: qty 2

## 2016-05-13 MED ORDER — OXYCODONE-ACETAMINOPHEN 5-325 MG PO TABS
2.0000 | ORAL_TABLET | Freq: Once | ORAL | Status: AC
  Administered 2016-05-13: 2 via ORAL
  Filled 2016-05-13: qty 2

## 2016-05-13 MED ORDER — HEPARIN SODIUM (PORCINE) 1000 UNIT/ML DIALYSIS
1000.0000 [IU] | INTRAMUSCULAR | Status: DC | PRN
  Filled 2016-05-13: qty 1

## 2016-05-13 MED ORDER — FERRIC CITRATE 1 GM 210 MG(FE) PO TABS: 420.0000 mg | ORAL_TABLET | Freq: Three times a day (TID) | ORAL | Status: DC

## 2016-05-13 MED ORDER — ALTEPLASE 2 MG IJ SOLR: 2.0000 mg | Freq: Once | INTRAMUSCULAR | Status: DC | PRN

## 2016-05-13 NOTE — Consult Note (Signed)
Reason for Consult: Hyperkalemia in patient with ESRD-missed dialysis Referring Physician: Gareth Morgan M.D (EDP)  HPI:  50 year old African-American man with past medical history significant for ESRD secondary to hypertension and previously failed kidney transplant who is on dialysis on a T/T/S schedule at [facility] in [facility]. He also has a history of DVT/PE on chronic Coumadin, congestive heart failure/nonischemic cardiomyopathy, type 2 diabetes mellitus and polysubstance abuse with poor adherence to therapy. Presents with nausea and vomiting for the last 2 days as well as increasing pain over his left arm (he had revision of left radiocephalic fistula status post hematoma evacuation with bovine patch revision on 9/22 at [facility]). Last went to dialysis last week on Thursday per his report and missed dialysis on Saturday "because they told him he didn't need any". In the emergency room found to have hyperkalemia of 6.9 and is being admitted for management of his presenting complaints.  He denies any shortness of breath and also some midline chest pain. Denies any fever or chills. Prefers not to answer questions at this time because of nausea.  Past Medical History:  Diagnosis Date  . Anemia   . Anxiety   . Chronic combined systolic and diastolic CHF (congestive heart failure) (HCC)    a. EF 20-25% by echo in 08/2015 b. echo 10/2015: EF 35-40%, diffuse HK, severe LAE, moderate RAE, small pericardial effusion  . Complication of anesthesia    itching, sore throat  . Depression   . Dialysis patient ([place])   . ESRD (end stage renal disease) ([place])    due to HTN per patient, followed at [facility], s/p failed kidney transplant - dialysis Tue, Th, Sat  . Hyperkalemia 12/2015  . Hypertension   . Junctional rhythm    a. noted in 08/2015: hyperkalemic at that time  b. 12/2015: presented in junctional rhythm w/ K+ of 6.6. Resolved with improvement of K+ levels.  . Nonischemic cardiomyopathy  ([place])    a. 08/2014: cath showing minimal CAD, but tortuous arteries noted.   . Renal insufficiency   . Shortness of breath   . Type II diabetes mellitus (HCC)    No history per patient, but remains under history as A1c would not be accurate given on dialysis    Past Surgical History:  Procedure Laterality Date  . CAPD INSERTION    . CAPD REMOVAL    . INGUINAL HERNIA REPAIR Right 02/14/2015   Procedure: REPAIR INCARCERATED RIGHT INGUINAL HERNIA;  Surgeon: [name], MD;  Location: [place];  Service: General;  Laterality: Right;  . INSERTION OF DIALYSIS CATHETER Right 09/23/2015   Procedure: exchange of Right internal Dialysis Catheter.;  Surgeon: [name], MD;  Location: [place];  Service: Vascular;  Laterality: Right;  . KIDNEY RECEIPIENT  2006   failed and started HD in March 2014  . LEFT HEART CATHETERIZATION WITH CORONARY ANGIOGRAM N/A 09/02/2014   Procedure: LEFT HEART CATHETERIZATION WITH CORONARY ANGIOGRAM;  Surgeon: [name], MD;  Location: [facility] CATH LAB;  Service: Cardiovascular;  Laterality: N/A;    Family History  Problem Relation Age of Onset  . Hypertension Other     Social History:  reports that he has quit smoking. His smoking use included Cigarettes. He smoked 0.00 packs per day for 1.00 year. He has never used smokeless tobacco. He reports that he does not drink alcohol or use drugs.  Allergies:  Allergies  Allergen Reactions  . Butalbital-Apap-Caffeine Shortness Of Breath and Swelling    Swelling in  throat  . Ferrlecit [Na Ferric Gluc Cplx In Sucrose] Shortness Of Breath, Swelling and Other (See Comments)    Swelling in throat  . Minoxidil Shortness Of Breath  . Darvocet [Propoxyphene N-Acetaminophen] Hives  . Other Hives    Allergy listed on file from [place] (Pt did not mention any allergies) Pt able to do norco (tylenol)    Medications:  Scheduled: . dextrose  1 ampule Intravenous Once  . insulin aspart  5 Units Intravenous Once  .  ondansetron [facility]) IV  4 mg Intravenous Once    BMP Latest Ref Rng & Units 05/13/2016 04/01/2016 04/01/2016  Glucose 65 - 99 mg/dL 130(H) - 101(H)  BUN 6 - 20 mg/dL 61(H) - 65(H)  Creatinine 0.61 - 1.24 mg/dL 16.63(H) - 11.37(H)  Sodium 135 - 145 mmol/L 137 - 137  Potassium 3.5 - 5.1 mmol/L 6.9(HH) 4.6 6.1(H)  Chloride 101 - 111 mmol/L 100(L) - 102  CO2 22 - 32 mmol/L 23 - 23  Calcium 8.9 - 10.3 mg/dL 8.8(L) - 8.9   CBC Latest Ref Rng & Units 05/13/2016 04/01/2016 03/31/2016  WBC 4.0 - 10.5 K/uL 7.1 - -  Hemoglobin 13.0 - 17.0 g/dL 8.4(L) 9.2(L) 9.9(L)  Hematocrit 39.0 - 52.0 % 26.8(L) 27.0(L) 29.0(L)  Platelets 150 - 400 K/uL 177 - -     No results found.  Review of Systems  Constitutional: Positive for malaise/fatigue. Negative for chills and fever.  HENT: Negative.   Eyes: Negative.   Respiratory: Negative.   Cardiovascular: Positive for chest pain. Negative for orthopnea.  Gastrointestinal: Positive for nausea and vomiting. Negative for constipation and diarrhea.  Genitourinary: Negative.   Skin: Negative.   Neurological: Negative for dizziness and tremors.   Blood pressure (!) 191/105, pulse 61, temperature 99 F (37.2 C), temperature source Oral, resp. rate 14, SpO2 100 %. Physical Exam  Nursing note and vitals reviewed. Constitutional: He is oriented to person, place, and time. He appears well-developed and well-nourished. No distress.  HENT:  Head: Normocephalic and atraumatic.  Nose: Nose normal.  Eyes: EOM are normal. Pupils are equal, round, and reactive to light. No scleral icterus.  Neck: Normal range of motion. Neck supple. No JVD present. No thyromegaly present.  Cardiovascular: Normal rate, regular rhythm and normal heart sounds.  Exam reveals no friction rub.   No murmur heard. Respiratory: Effort normal and breath sounds normal. He has no wheezes. He has no rales.  GI: Soft. Bowel sounds are normal. There is no tenderness.  Musculoskeletal: He exhibits  no edema.  Recently repaired left radiocephalic fistula at the antecubital area with some tenderness  Neurological: He is alert and oriented to person, place, and time.  Skin: Skin is warm and dry. No erythema.    Assessment/Plan: 1. Hyperkalemia: Secondary to missed hemodialysis in a patient with ESRD-will order for hemodialysis tonight. 2. End-stage renal disease: Unfortunately with chronic history of poor adherence to treatment recommendations/dialysis. Hemodialysis tonight using right IJ TDC. 3. Hypertension: Elevated likely secondary to poor adherence to ongoing antihypertensive therapy-reassess with ultrafiltration and hemodialysis. 4. Anemia of chronic kidney disease: Check iron studies and redose ESA- appears to be worse from a combination of poor adherence with hemodialysis/ESA. 5. Secondary hyperparathyroidism: Resume phosphorus binder with meals 3 times a day before meals  Aisia Correira K. 05/13/2016, 8:00 PM

## 2016-05-13 NOTE — ED Triage Notes (Signed)
No UA ordered due to pt reports he does not make urine any longer.

## 2016-05-13 NOTE — ED Notes (Signed)
Pt no longer answering RNs questions. Pt states that he is nauseated and cannot answer too many questions.

## 2016-05-13 NOTE — ED Triage Notes (Signed)
Pt reports emesis that began yesterday. He also reports left arm pain, he had AV fistula placed 2 weeks ago. Pt is t/th/sa dialysis pt last dialyzed on Thursday.

## 2016-05-13 NOTE — ED Notes (Signed)
IV team now at bedside

## 2016-05-13 NOTE — H&P (Signed)
History and Physical  Patient Name: Frank [name]     HGD:[id number]    DOB: Mar 25, 1966    DOA: 05/13/2016 PCP: Angelica Chessman, MD   Patient coming from: Home  Chief Complaint: Needs dialysis  HPI: Frank [name] is a 50 y.o. male with a past medical history significant for ESRD on HD without HD center, failed renal transplant, chronic pain, recurrent DVT on warfarin, isch CM/CHF EF 35-40%, history of RCC scheduled for nephrectomy, and HTN who presents with needing dialysis and is found to have K 6.9.  The patient has been discharged from his previous dialysis centers in [place] and has been dialyzing out of the ER at [facility] and [facility] lately (it appears that this has been happening for the past three months; Urology notes suggest that the patient is not truthful about his dialysis).    Recently, he was admitted to [facility] from 9/12-9/26 for AV fistula placement on 9/62, complicated by hematoma formation requiring bovine patch on 9/22.  Since then, he has been dialyzing from the ER at [facility], most recently on:  _0 10/20  In the last day or two, he has had nausea with vomiting and generalized malaise, and so he thought he was due for dialysis and came to the ER.  He has not had dyspnea, leg swelling, other swelling, orthopnea or chest pain.  ED course: -Afebrile, heart rate 110s, respirations and pulse oximetry normal, blood pressure 190/100 -Na 137, K 6.9, Cr 16.63, WBC 7.1K, Hgb 8.4 -ECG showed prominent T waves, but asymmetric and without bradycardia, QRS changes -He was given insulin/D50, calcium and Nephrology arranged for overnight dialysis and TRH were asked to admit for observation      ROS: Review of Systems  Constitutional: Positive for malaise/fatigue.  Gastrointestinal: Positive for nausea and vomiting.  Musculoskeletal: Positive for back pain.       Left arm pain near graft site  All other systems reviewed and are negative.         Past Medical History:    Diagnosis Date  . Anemia   . Anxiety   . Chronic combined systolic and diastolic CHF (congestive heart failure) (HCC)    a. EF 20-25% by echo in 08/2015 b. echo 10/2015: EF 35-40%, diffuse HK, severe LAE, moderate RAE, small pericardial effusion  . Complication of anesthesia    itching, sore throat  . Depression   . Dialysis patient (Frank [name])   . ESRD (end stage renal disease) ([place])    due to HTN per patient, followed at [facility], s/p failed kidney transplant - dialysis Tue, Th, Sat  . Hyperkalemia 12/2015  . Hypertension   . Junctional rhythm    a. noted in 08/2015: hyperkalemic at that time  b. 12/2015: presented in junctional rhythm w/ K+ of 6.6. Resolved with improvement of K+ levels.  . Nonischemic cardiomyopathy ([place])    a. 08/2014: cath showing minimal CAD, but tortuous arteries noted.   . Renal insufficiency   . Shortness of breath   . Type II diabetes mellitus (HCC)    No history per patient, but remains under history as A1c would not be accurate given on dialysis    Past Surgical History:  Procedure Laterality Date  . CAPD INSERTION    . CAPD REMOVAL    . INGUINAL HERNIA REPAIR Right 02/14/2015   Procedure: REPAIR INCARCERATED RIGHT INGUINAL HERNIA;  Surgeon: [name], MD;  Location: [facility];  Service: General;  Laterality: Right;  . INSERTION OF  DIALYSIS CATHETER Right 09/23/2015   Procedure: exchange of Right internal Dialysis Catheter.;  Surgeon: [name], MD;  Location: [facility];  Service: Vascular;  Laterality: Right;  . KIDNEY RECEIPIENT  2006   failed and started HD in March 2014  . LEFT HEART CATHETERIZATION WITH CORONARY ANGIOGRAM N/A 09/02/2014   Procedure: LEFT HEART CATHETERIZATION WITH CORONARY ANGIOGRAM;  Surgeon: [name], MD;  Location: [facility] CATH LAB;  Service: Cardiovascular;  Laterality: N/A;    Social History: Patient lives alone  The patient walks unassisted.  He is a former Development worker, community.  He is a former smoker.    Allergies  Allergen Reactions  .  Butalbital-Apap-Caffeine Shortness Of Breath and Swelling    Swelling in throat  . Ferrlecit [Na Ferric Gluc Cplx In Sucrose] Shortness Of Breath, Swelling and Other (See Comments)    Swelling in throat  . Minoxidil Shortness Of Breath  . Darvocet [Propoxyphene N-Acetaminophen] Hives  . Other Hives    Allergy listed on file from [facility] (Pt did not mention any allergies) Pt able to do norco (tylenol)    Family history: family history includes Hypertension in his other.  Prior to Admission medications   Medication Sig Start Date End Date Taking? Authorizing Provider  allopurinol (ZYLOPRIM) 100 MG tablet Take 100 mg by mouth daily.   Yes Historical Provider, MD  amLODipine (NORVASC) 10 MG tablet Take 10 mg by mouth daily.   Yes Historical Provider, MD  atorvastatin (LIPITOR) 40 MG tablet Take 1 tablet (40 mg total) by mouth daily at 6 PM. 09/02/14  Yes Barton Dubois, MD  AURYXIA 1 GM 210 MG(Fe) TABS Take 420 mg by mouth 3 (three) times daily. 01/26/16  Yes Historical Provider, MD  carvedilol (COREG) 25 MG tablet Take 25 mg by mouth 2 (two) times daily with a meal.   Yes Historical Provider, MD  cinacalcet (SENSIPAR) 30 MG tablet Take 30 mg by mouth daily.   Yes Historical Provider, MD  cloNIDine (CATAPRES - DOSED IN MG/24 HR) 0.3 mg/24hr patch Place 0.3 mg onto the skin once a week.   Yes Historical Provider, MD  doxazosin (CARDURA) 2 MG tablet Take 2 mg by mouth daily.   Yes Historical Provider, MD  famotidine (PEPCID) 20 MG tablet Take 1 tablet (20 mg total) by mouth 2 (two) times daily. 02/13/16  Yes Charlesetta Shanks, MD  gabapentin (NEURONTIN) 100 MG capsule Take 100 mg by mouth 3 (three) times daily.   Yes Historical Provider, MD  hydrALAZINE (APRESOLINE) 100 MG tablet Take 100 mg by mouth every 8 (eight) hours.   Yes Historical Provider, MD  isosorbide mononitrate (IMDUR) 60 MG 24 hr tablet Take 1 tablet (60 mg total) by mouth daily. 01/09/16  Yes Smiley Houseman, MD  omeprazole  (PRILOSEC) 20 MG capsule Take 20 mg by mouth daily. 07/01/13  Yes Historical Provider, MD  ondansetron (ZOFRAN ODT) 4 MG disintegrating tablet 76m ODT q4 hours prn nausea/vomit 05/02/16  Yes Olugbemiga E JDoreene Burke MD  predniSONE (DELTASONE) 10 MG tablet Take 10 mg by mouth daily with breakfast.   Yes Historical Provider, MD  sevelamer carbonate (RENVELA) 800 MG tablet Take 800-1,600 mg by mouth 3 (three) times daily with meals. 2 tabs three times daily with meals, and 1 tablet with snacks   Yes Historical Provider, MD  warfarin (COUMADIN) 2 MG tablet Take 2 mg by mouth daily. Take with 551mtablet to equal 33m42m/12/17  Yes Historical Provider, MD  warfarin (COUMADIN) 5  MG tablet Take 5 mg by mouth daily. Take with 37m tablet to equal 738m7/12/17  Yes Historical Provider, MD  promethazine (PHENERGAN) 25 MG tablet Take 1 tablet (25 mg total) by mouth every 8 (eight) hours as needed for nausea or vomiting. Patient not taking: Reported on 05/13/2016 02/11/16   ChDalia HeadingPA-C       Physical Exam: BP 156/99   Pulse 75   Temp 99 F (37.2 C) (Oral)   Resp 16   SpO2 94%  General appearance: Well-developed, adult male, alert and in no acute distress, being connected to dialysis machine.   Eyes: Anicteric, conjunctiva pink, lids and lashes normal. PERRL.    ENT: No nasal deformity, discharge, epistaxis.  Hearing normal. OP moist without lesions.   Neck: No neck masses.  Trachea midline.  No thyromegaly/tenderness. Lymph: No cervical or supraclavicular lymphadenopathy. Skin: Warm and dry.  Severe xerosis.   Cardiac: Tachycardic, regular, nl S1-S2, no murmurs appreciated.  Capillary refill is brisk.  No JVD.  No LE edema.  Radial and DP pulses 2+ and symmetric. Respiratory: Normal respiratory rate and rhythm.  CTAB without rales or wheezes. Abdomen: Abdomen soft.  No TTP. No ascites, distension, hepatosplenomegaly.   MSK: No deformities or effusions.  No cyanosis or clubbing.  The left arm has  a clean and dry and intact surgical wound with excellent thrill.  There is no redness, induration, drainage, or other signs of infection.  He appears to have pain when palpated on or near the graft. Neuro: Cranial nerves normal.  Sensation intact to light touch. Speech is fluent.  Muscle strength normal.    Psych: Sensorium intact and responding to questions, attention normal.  Behavior appropriate.  Affect normal.  Judgment and insight appear normal.     Labs on Admission:  I have personally reviewed following labs and imaging studies: CBC:  Recent Labs Lab 05/13/16 1800  WBC 7.1  HGB 8.4*  HCT 26.8*  MCV 88.2  PLT 17329 Basic Metabolic Panel:  Recent Labs Lab 05/13/16 1800  NA 137  K 6.9*  CL 100*  CO2 23  GLUCOSE 130*  BUN 61*  CREATININE 16.63*  CALCIUM 8.8*   GFR: Estimated Creatinine Clearance: 5.4 mL/min (by C-G formula based on SCr of 16.63 mg/dL (H)).  Liver Function Tests:  Recent Labs Lab 05/13/16 1800  AST 36  ALT 13*  ALKPHOS 135*  BILITOT 0.5  PROT 7.8  ALBUMIN 3.5    Recent Labs Lab 05/13/16 1800  LIPASE 54*   No results for input(s): AMMONIA in the last 168 hours. Coagulation Profile: No results for input(s): INR, PROTIME in the last 168 hours. Cardiac Enzymes: No results for input(s): CKTOTAL, CKMB, CKMBINDEX, TROPONINI in the last 168 hours. BNP (last 3 results) No results for input(s): PROBNP in the last 8760 hours. HbA1C: No results for input(s): HGBA1C in the last 72 hours. CBG: No results for input(s): GLUCAP in the last 168 hours. Lipid Profile: No results for input(s): CHOL, HDL, LDLCALC, TRIG, CHOLHDL, LDLDIRECT in the last 72 hours. Thyroid Function Tests: No results for input(s): TSH, T4TOTAL, FREET4, T3FREE, THYROIDAB in the last 72 hours. Anemia Panel: No results for input(s): VITAMINB12, FOLATE, FERRITIN, TIBC, IRON, RETICCTPCT in the last 72 hours. Sepsis Labs: Invalid input(s): PROCALCITONIN, LACTICIDVEN No  results found for this or any previous visit (from the past 240 hour(s)).       Radiological Exams on Admission: Personally reviewed: No results found.  EKG: Independently  reviewed. Rate 65, QTc 465, QRS normal.  T waves without symmetric peaking, ST changes.  Echocardiogram Apr 2017: EF 35-40%         Assessment/Plan  1. Hyperkalemia:    From missed HD. -HD now, per Nephrology -Repeat BMP afterwards    2. ESRD:  Has been fired from HD centers in [place] and [place] SW staff and risk mgmt have been unable to find the patient a new HD center.  He claims to me that he is being seen by [facility] now and that they are arranging a new HD center here in [place] as soon as his graft matures. -Continue Renvela and cinacalcet  3. HTN:  -Continue Imdur, doxazosin, hydralazine, carvedilol, clonidine, and amlodipine  4. CHF:  EF 35-40%.  Hypervolemic, slightly.  CXR without edema. -HD -Continue BB  5. Chronic pain:  Patient recently fired by his Urologist/opioid prescriber because of stating he was getting dialysis regularly when he was not and also obtaining prescriptions for opioids from ER providers without telling his Urologist.  He has chronic abdominal pain which has no clear source per Urology notes. -Avoid opioid narcotics  6. Anemia of renal disease:  Stable -Continue iron -ESA per nephrology  7. History of DVT:  -Continue warfarin, dosed per pharmacy  8. Failed renal transplant: -Continue prednisone 10 mg daily  9. Other medications: -Continue PPI and H2RA -Continue allopurinol -Continue gabapentin            DVT prophylaxis: N/A, on warfarin  Code Status: FULL  Family Communication: None present  Disposition Plan: Anticipate HD and then repeat BMP and discharge pending Nephrology recommendations Consults called: None overnigh Admission status: OBS At the point of initial evaluation, it is my clinical opinion that admission for OBSERVATION is  reasonable and necessary because the patient's presenting complaints in the context of their chronic conditions represent sufficient risk of deterioration or significant morbidity to constitute reasonable grounds for close observation in the hospital setting, but that the patient may be medically stable for discharge from the hospital within 24 to 48 hours.    Medical decision making: Patient seen at 11:00 PM on 05/13/2016.  The patient was discussed with Dr. Billy Fischer.  What exists of the patient's chart and was reviewed in depth and outside records in [facility] were requested and summarized above.  Clinical condition: stable.        [name] [facility] Pager [phone]

## 2016-05-13 NOTE — ED Notes (Signed)
Failed IV attempt x2

## 2016-05-13 NOTE — ED Notes (Signed)
ECG was taken and transmitted to MUSE, however, due to printer malfunction, can't be printed at this time.

## 2016-05-14 ENCOUNTER — Observation Stay (HOSPITAL_COMMUNITY): Payer: Medicare Other

## 2016-05-14 DIAGNOSIS — E875 Hyperkalemia: Secondary | ICD-10-CM | POA: Diagnosis not present

## 2016-05-14 DIAGNOSIS — R0602 Shortness of breath: Secondary | ICD-10-CM | POA: Diagnosis not present

## 2016-05-14 DIAGNOSIS — N186 End stage renal disease: Secondary | ICD-10-CM | POA: Diagnosis not present

## 2016-05-14 DIAGNOSIS — Z992 Dependence on renal dialysis: Secondary | ICD-10-CM | POA: Diagnosis not present

## 2016-05-14 DIAGNOSIS — G894 Chronic pain syndrome: Secondary | ICD-10-CM | POA: Diagnosis not present

## 2016-05-14 LAB — GLUCOSE, CAPILLARY
GLUCOSE-CAPILLARY: 103 mg/dL — AB (ref 65–99)
Glucose-Capillary: 80 mg/dL (ref 65–99)
Glucose-Capillary: 98 mg/dL (ref 65–99)
Glucose-Capillary: 164 mg/dL — ABNORMAL HIGH (ref 65–99)

## 2016-05-14 LAB — PROTIME-INR
INR: 1.19
Prothrombin Time: 15.1 seconds (ref 11.4–15.2)

## 2016-05-14 LAB — BASIC METABOLIC PANEL
ANION GAP: 12 (ref 5–15)
Anion gap: 9 (ref 5–15)
BUN: 23 mg/dL — AB (ref 6–20)
BUN: 35 mg/dL — AB (ref 6–20)
CHLORIDE: 99 mmol/L — AB (ref 101–111)
CO2: 25 mmol/L (ref 22–32)
CO2: 26 mmol/L (ref 22–32)
CREATININE: 10.96 mg/dL — AB (ref 0.61–1.24)
Calcium: 8.4 mg/dL — ABNORMAL LOW (ref 8.9–10.3)
Calcium: 8.3 mg/dL — ABNORMAL LOW (ref 8.9–10.3)
Chloride: 97 mmol/L — ABNORMAL LOW (ref 101–111)
Creatinine, Ser: 8.73 mg/dL — ABNORMAL HIGH (ref 0.61–1.24)
GFR calc Af Amer: 7 mL/min — ABNORMAL LOW (ref >60)
GFR calc Af Amer: 5 mL/min — ABNORMAL LOW (ref >60)
GFR calc non Af Amer: 5 mL/min — ABNORMAL LOW (ref >60)
GFR, EST NON AFRICAN AMERICAN: 6 mL/min — AB (ref >60)
GLUCOSE: 106 mg/dL — AB (ref 65–99)
Glucose, Bld: 97 mg/dL (ref 65–99)
POTASSIUM: 4.5 mmol/L (ref 3.5–5.1)
Potassium: 5.9 mmol/L — ABNORMAL HIGH (ref 3.5–5.1)
SODIUM: 134 mmol/L — AB (ref 135–145)
SODIUM: 134 mmol/L — AB (ref 135–145)

## 2016-05-14 LAB — HEPATITIS B SURFACE ANTIGEN: HEP B S AG: NEGATIVE

## 2016-05-14 LAB — CBC
HCT: 25 % — ABNORMAL LOW (ref 39.0–52.0)
HEMOGLOBIN: 8.1 g/dL — AB (ref 13.0–17.0)
MCH: 28 pg (ref 26.0–34.0)
MCHC: 32.4 g/dL (ref 30.0–36.0)
MCV: 86.5 fL (ref 78.0–100.0)
Platelets: 166 10*3/uL (ref 150–400)
RBC: 2.89 MIL/uL — ABNORMAL LOW (ref 4.22–5.81)
RDW: 15.1 % (ref 11.5–15.5)
WBC: 6.5 10*3/uL (ref 4.0–10.5)

## 2016-05-14 LAB — HEPATITIS B SURFACE ANTIBODY,QUALITATIVE: HEP B S AB: REACTIVE

## 2016-05-14 MED ORDER — WARFARIN SODIUM 6 MG PO TABS
7.0000 mg | ORAL_TABLET | Freq: Once | ORAL | Status: AC
  Administered 2016-05-14: 7 mg via ORAL
  Filled 2016-05-14: qty 1

## 2016-05-14 MED ORDER — WARFARIN - PHARMACIST DOSING INPATIENT: Freq: Every day | Status: DC

## 2016-05-14 MED ORDER — SODIUM POLYSTYRENE SULFONATE 15 GM/60ML PO SUSP
30.0000 g | Freq: Once | ORAL | Status: AC
  Administered 2016-05-14: 30 g via ORAL
  Filled 2016-05-14: qty 120

## 2016-05-14 MED ORDER — SEVELAMER CARBONATE 800 MG PO TABS: 800.0000 mg | ORAL_TABLET | ORAL | Status: DC | PRN

## 2016-05-14 NOTE — Care Management Obs Status (Signed)
[facility] NOTIFICATION   Patient Details  Name: Frank [name] MRN: [id number] Date of Birth: [birth year]   Medicare Observation Status Notification Given:  Yes    Raju Coppolino, Rory Percy, RN 05/14/2016, 4:30 PM

## 2016-05-14 NOTE — Progress Notes (Addendum)
Patient now complaining of "just not feeling right", Nausea-claims he is weak and mildly SOB. Lying in bed. When seen earlier this morning-he did not have any of these complaints Vitals-stable  Lungs: moving air well B/L-do not hear rales Abd-soft,Non tender and not distentded  Hold d/c for now Monitor closely Repeat Labs/CXR

## 2016-05-14 NOTE — Progress Notes (Signed)
Patient refused all medications stating medications are "bringing him down". Patient educated and medications were held.

## 2016-05-14 NOTE — Progress Notes (Signed)
Pt refuses kayexalate and all other bed time medications. Pt states "we have done nothing for him this whole admission",  to "just make sure that doctor has my paperwork in the morning", and he's "not taking kayexalate at eleven o'clock at night". Educated the patient that he does not have to take anything as that is his right but that his potassium was still elevated and it would be in his best interest to take it. Pt continues to decline at this time and request to be left alone as he is not taking any medications. Will continue to monitor. Dorthey Sawyer, RN

## 2016-05-14 NOTE — Progress Notes (Signed)
ANTICOAGULATION CONSULT NOTE - Follow Up Consult  Pharmacy Consult for Coumadin Indication: h/o PE/DVT  Allergies  Allergen Reactions  . Butalbital-Apap-Caffeine Shortness Of Breath and Swelling    Swelling in throat  . Ferrlecit [Na Ferric Gluc Cplx In Sucrose] Shortness Of Breath, Swelling and Other (See Comments)    Swelling in throat  . Minoxidil Shortness Of Breath  . Darvocet [Propoxyphene N-Acetaminophen] Hives  . Other Hives    Allergy listed on file from [facility] (Pt did not mention any allergies) Pt able to do norco (tylenol)    Patient Measurements: Height: _0  (188 cm) Weight:  (UTA, pt refused to be weighed.) IBW/kg (Calculated) : 82.2  Vital Signs: Temp: 98.7 F (37.1 C) (10/24 0945) Temp Source: Oral (10/24 0945) BP: 116/77 (10/24 1400) Pulse Rate: 73 (10/24 0945)  Labs:  Recent Labs  05/13/16 1800 05/14/16 0502  HGB 8.4*  --   HCT 26.8*  --   PLT 177  --   LABPROT  --  15.1  INR  --  1.19  CREATININE 16.63* 8.73*   Estimated Creatinine Clearance: 10.3 mL/min (by C-G formula based on SCr of 8.73 mg/dL (H)).  Medications:  Prescriptions Prior to Admission  Medication Sig Dispense Refill Last Dose  . allopurinol (ZYLOPRIM) 100 MG tablet Take 100 mg by mouth daily.   Past Week at Unknown time  . amLODipine (NORVASC) 10 MG tablet Take 10 mg by mouth daily.   Past Week at Unknown time  . atorvastatin (LIPITOR) 40 MG tablet Take 1 tablet (40 mg total) by mouth daily at 6 PM. 30 tablet 1 Past Week at Unknown time  . AURYXIA 1 GM 210 MG(Fe) TABS Take 420 mg by mouth 3 (three) times daily.   Past Week at Unknown time  . carvedilol (COREG) 25 MG tablet Take 25 mg by mouth 2 (two) times daily with a meal.   Past Week at Unknown time  . cinacalcet (SENSIPAR) 30 MG tablet Take 30 mg by mouth daily.   Past Week at Unknown time  . cloNIDine (CATAPRES - DOSED IN MG/24 HR) 0.3 mg/24hr patch Place 0.3 mg onto the skin once a week.   Past Week at Unknown time   . doxazosin (CARDURA) 2 MG tablet Take 2 mg by mouth daily.   Past Week at Unknown time  . famotidine (PEPCID) 20 MG tablet Take 1 tablet (20 mg total) by mouth 2 (two) times daily. 30 tablet 0 Past Week at Unknown time  . gabapentin (NEURONTIN) 100 MG capsule Take 100 mg by mouth 3 (three) times daily.   Past Week at Unknown time  . hydrALAZINE (APRESOLINE) 100 MG tablet Take 100 mg by mouth every 8 (eight) hours.   Past Week at Unknown time  . isosorbide mononitrate (IMDUR) 60 MG 24 hr tablet Take 1 tablet (60 mg total) by mouth daily.   Past Week at Unknown time  . omeprazole (PRILOSEC) 20 MG capsule Take 20 mg by mouth daily.   Past Week at Unknown time  . ondansetron (ZOFRAN ODT) 4 MG disintegrating tablet 7m ODT q4 hours prn nausea/vomit 30 tablet 3 Past Month at Unknown time  . predniSONE (DELTASONE) 10 MG tablet Take 10 mg by mouth daily with breakfast.   Past Week at Unknown time  . sevelamer carbonate (RENVELA) 800 MG tablet Take 800-1,600 mg by mouth 3 (three) times daily with meals. 2 tabs three times daily with meals, and 1 tablet with snacks   Past  Week at Unknown time  . warfarin (COUMADIN) 2 MG tablet Take 2 mg by mouth daily. Take with 64m tablet to equal 791m  Past Week at 1800  . warfarin (COUMADIN) 5 MG tablet Take 5 mg by mouth daily. Take with 29m55mablet to equal 7mg89mPast Week at 1800  . promethazine (PHENERGAN) 25 MG tablet Take 1 tablet (25 mg total) by mouth every 8 (eight) hours as needed for nausea or vomiting. (Patient not taking: Reported on 05/13/2016) 15 tablet 0 Not Taking at Unknown time   Assessment: [id number] c/o emesis and LUE pain, missed Sat HD, found to be hyperkalemic, admitted for further w/u, to continue Coumadin for h/o VTE. HgB 8.4, Platelet 177, INR 1.19. No bleeding or abnormal bruising reported.  Goal of Therapy:  INR 2-3 Monitor platelets by anticoagulation protocol: Yes   Plan:  Warfarin 7mg 40might x1 Daily INR, CBC Monitor s/sx of  bleeding  Jamion Carter L Ula Couvillon 05/14/2016,5:37 PM

## 2016-05-14 NOTE — Progress Notes (Addendum)
Spoke with Dr. Rogers Seeds to discharge patient from his point of view. Per Dr. Maricela Bo plans by [facility] to accept patient for outpatient hemodialysis. He recommends that patient will continue to need to come to the emergency room for his dialysis. See discharge summary for details.

## 2016-05-14 NOTE — Discharge Summary (Signed)
PATIENT DETAILS Name: Frank [name] Age: 50 y.o. Sex: male Date of Birth: 06-12-1966 MRN: [id number]. Admitting Physician: [name], MD YJE:HUDJSH, Gabrielle Dare, MD  Admit Date: 05/13/2016 Discharge date: 05/14/2016  Recommendations for Outpatient Follow-up:  1. Follow up with PCP in 1 weeks 2. Check INR next 2-3 days-on Coumadin  3. Please obtain BMP/CBC in one week  Admitted From:  Home  Disposition: [place]: No  Equipment/Devices: None  Discharge Condition: Stable  CODE STATUS: FULL CODE  Diet recommendation:  Heart Healthy  Brief Summary: See H&P, Labs, Consult and Test reports for all details in brief,  Frank [name] is a 50 y.o. male with a past medical history significant for ESRD on HD without HD center, failed renal transplant, chronic pain, recurrent DVT on warfarin, isch CM/CHF EF 35-40%, history of RCC scheduled for nephrectomy, and HTN who presents with needing dialysis and is found to have K 6.9. Admitted overnight for hemodialysis.  Brief Hospital Course: Hyperkalemia:  from missed hemodialysis, resolved post dialysis.   ESRD:   apparently has been fired hemodialysis centers in [place] claimed to me this morning that he was in the process of establishing care with [facility] here in [place] spoke with Dr. Marylu Lund tells me this is NOT the case. There are no plans by [facility] to accept him per Dr. Joelyn Oms. Patient is currently without a hemodialysis center, and has been going to numerous local ERs for his dialysis needs. Difficult situation-no further recommendations from nephrology at this point-apart from discharge  HTN: Continue Imdur, doxazosin, hydralazine, carvedilol, clonidine, and amlodipine  History of DVT: Continue warfarin-INR is subtherapeutic-claims he missed a few doses of Coumadin due to vomiting. I have asked him to resume his Coumadin and follow-up with his primary care practitioner. I  highly doubt compliance in this patient  Failed renal transplant:Continue prednisone 10 mg daily  Chronic pain:  per H&P-Patient recently fired by his Urologist/opioid prescriber because of stating he was getting dialysis regularly when he was not and also obtaining prescriptions for opioids from ER providers without telling his Urologist.  He has chronic abdominal pain which has no clear source per Urology notes. He will not be provided with narcotics on discharge.  Procedures/Studies: None  Discharge Diagnoses:  Principal Problem:   Hyperkalemia Active Problems:   Essential hypertension, malignant   ESRD on dialysis- Tues, Thurs, Sat in [place], nephro: [name]   Chronic combined systolic and diastolic CHF (congestive heart failure) (HCC)   Chronic pain syndrome   History of noncompliance with medical treatment   Anemia of chronic disease   Discharge Instructions:  Activity:  As tolerated with Full fall precautions use walker/cane & assistance as needed  Discharge Instructions    Diet - low sodium heart healthy    Complete by:  As directed    Discharge instructions    Complete by:  As directed    Follow with Primary MD  Angelica Chessman, MD  and your nephrologist  Your INR is subtherapeutic, please get the INR checked in the next 2-3 days by your primary care M.D.  Please get a complete blood count and chemistry panel checked by your Primary MD at your next visit, and again as instructed by your Primary MD.  Get Medicines reviewed and adjusted: Please take all your medications with you for your next visit with your Primary MD  Laboratory/radiological data: Please request your Primary MD to go over all hospital tests  and procedure/radiological results at the follow up, please ask your Primary MD to get all Hospital records sent to his/her office.  In some cases, they will be blood work, cultures and biopsy results pending at the time of your discharge. Please  request that your primary care M.D. follows up on these results.  Also Note the following: If you experience worsening of your admission symptoms, develop shortness of breath, life threatening emergency, suicidal or homicidal thoughts you must seek medical attention immediately by calling 911 or calling your MD immediately  if symptoms less severe.  You must read complete instructions/literature along with all the possible adverse reactions/side effects for all the Medicines you take and that have been prescribed to you. Take any new Medicines after you have completely understood and accpet all the possible adverse reactions/side effects.   Do not drive when taking Pain medications or sleeping medications (Benzodaizepines)  Do not take more than prescribed Pain, Sleep and Anxiety Medications. It is not advisable to combine anxiety,sleep and pain medications without talking with your primary care practitioner  Special Instructions: If you have smoked or chewed Tobacco  in the last 2 yrs please stop smoking, stop any regular Alcohol  and or any Recreational drug use.  Wear Seat belts while driving.  Please note: You were cared for by a hospitalist during your hospital stay. Once you are discharged, your primary care physician will handle any further medical issues. Please note that NO REFILLS for any discharge medications will be authorized once you are discharged, as it is imperative that you return to your primary care physician (or establish a relationship with a primary care physician if you do not have one) for your post hospital discharge needs so that they can reassess your need for medications and monitor your lab values.   Increase activity slowly    Complete by:  As directed        Medication List    STOP taking these medications   promethazine 25 MG tablet Commonly known as:  PHENERGAN     TAKE these medications   allopurinol 100 MG tablet Commonly known as:  ZYLOPRIM Take  100 mg by mouth daily.   amLODipine 10 MG tablet Commonly known as:  NORVASC Take 10 mg by mouth daily.   atorvastatin 40 MG tablet Commonly known as:  LIPITOR Take 1 tablet (40 mg total) by mouth daily at 6 PM.   AURYXIA 1 GM 210 MG(Fe) Tabs Generic drug:  Ferric Citrate Take 420 mg by mouth 3 (three) times daily.   carvedilol 25 MG tablet Commonly known as:  COREG Take 25 mg by mouth 2 (two) times daily with a meal.   cinacalcet 30 MG tablet Commonly known as:  SENSIPAR Take 30 mg by mouth daily.   cloNIDine 0.3 mg/24hr patch Commonly known as:  CATAPRES - Dosed in mg/24 hr Place 0.3 mg onto the skin once a week.   doxazosin 2 MG tablet Commonly known as:  CARDURA Take 2 mg by mouth daily.   famotidine 20 MG tablet Commonly known as:  PEPCID Take 1 tablet (20 mg total) by mouth 2 (two) times daily.   gabapentin 100 MG capsule Commonly known as:  NEURONTIN Take 100 mg by mouth 3 (three) times daily.   hydrALAZINE 100 MG tablet Commonly known as:  APRESOLINE Take 100 mg by mouth every 8 (eight) hours.   isosorbide mononitrate 60 MG 24 hr tablet Commonly known as:  IMDUR Take 1 tablet (60  mg total) by mouth daily.   omeprazole 20 MG capsule Commonly known as:  PRILOSEC Take 20 mg by mouth daily.   ondansetron 4 MG disintegrating tablet Commonly known as:  ZOFRAN ODT 28m ODT q4 hours prn nausea/vomit   predniSONE 10 MG tablet Commonly known as:  DELTASONE Take 10 mg by mouth daily with breakfast.   sevelamer carbonate 800 MG tablet Commonly known as:  RENVELA Take 800-1,600 mg by mouth 3 (three) times daily with meals. 2 tabs three times daily with meals, and 1 tablet with snacks   warfarin 2 MG tablet Commonly known as:  COUMADIN Take 2 mg by mouth daily. Take with 523mtablet to equal 60m83m warfarin 5 MG tablet Commonly known as:  COUMADIN Take 5 mg by mouth daily. Take with 2mg79mblet to equal 60mg 42m  Follow-up Information    JEGEDE,  OLUGBEMIGA, MD. Schedule an appointment as soon as possible for a visit in 1 week(s).   Specialty:  Internal Medicine Contact information: [phone]   Primary nephrologist or your usual dialysis center .   Why:  At your usual schedule         Allergies  Allergen Reactions  . Butalbital-Apap-Caffeine Shortness Of Breath and Swelling    Swelling in throat  . Ferrlecit [Na Ferric Gluc Cplx In Sucrose] Shortness Of Breath, Swelling and Other (See Comments)    Swelling in throat  . Minoxidil Shortness Of Breath  . Darvocet [Propoxyphene N-Acetaminophen] Hives  . Other Hives    [facility] Pt able to do norco (tylenol)    Consultations:   nephrology  Other Procedures/Studies:  No results found.   TODAY-DAY OF DISCHARGE:  Subjective:   BrianGanon Demasiy has no headache,no chest abdominal pain,no new weakness tingling or numbness, feels much better wants to go home today.   Objective:   Blood pressure (!) 174/88, pulse 73, temperature 98.7 F (37.1 C), temperature source Oral, resp. rate 18, height _0  (1.88 m), SpO2 96 %.  Intake/Output Summary (Last 24 hours) at 05/14/16 1039 Last data filed at 05/14/16 0900  Gross per 24 hour  Intake              480 ml  Output             3000 ml  Net            -2520 ml   Filed Weights    Exam: Awake Alert, Oriented *3, No new F.N deficits, Normal affect [place].AT,PERRAL Supple Neck,No JVD, No cervical lymphadenopathy appriciated.  Symmetrical Chest wall movement, Good air movement bilaterally, CTAB RRR,No Gallops,Rubs or new Murmurs, No Parasternal Heave +ve B.Sounds, Abd Soft, Non tender, No organomegaly appriciated, No rebound -guarding or rigidity. No Cyanosis, Clubbing or edema, No new Rash or bruise   PERTINENT RADIOLOGIC STUDIES: No results found.   PERTINENT LAB RESULTS: CBC:  Recent Labs   05/13/16 1800  WBC 7.1  HGB 8.4*  HCT 26.8*  PLT 177   CMET CMP     Component Value Date/Time   NA 134 (L) 05/14/2016 0502   K 4.5 05/14/2016 0502   CL 97 (L) 05/14/2016 0502   CO2 25 05/14/2016 0502   GLUCOSE 97 05/14/2016 0502   BUN 23 (H) 05/14/2016 0502   CREATININE 8.73 (H) 05/14/2016 0502   CALCIUM  8.4 (L) 05/14/2016 0502   PROT 7.8 05/13/2016 1800   ALBUMIN 3.5 05/13/2016 1800   AST 36 05/13/2016 1800   ALT 13 (L) 05/13/2016 1800   ALKPHOS 135 (H) 05/13/2016 1800   BILITOT 0.5 05/13/2016 1800   GFRNONAA 6 (L) 05/14/2016 0502   GFRAA 7 (L) 05/14/2016 0502    GFR Estimated Creatinine Clearance: 10.3 mL/min (by C-G formula based on SCr of 8.73 mg/dL (H)).  Recent Labs  05/13/16 1800  LIPASE 54*   No results for input(s): CKTOTAL, CKMB, CKMBINDEX, TROPONINI in the last 72 hours. Invalid input(s): POCBNP No results for input(s): DDIMER in the last 72 hours. No results for input(s): HGBA1C in the last 72 hours. No results for input(s): CHOL, HDL, LDLCALC, TRIG, CHOLHDL, LDLDIRECT in the last 72 hours. No results for input(s): TSH, T4TOTAL, T3FREE, THYROIDAB in the last 72 hours.  Invalid input(s): FREET3 No results for input(s): VITAMINB12, FOLATE, FERRITIN, TIBC, IRON, RETICCTPCT in the last 72 hours. Coags:  Recent Labs  05/14/16 0502  INR 1.19   Microbiology: No results found for this or any previous visit (from the past 240 hour(s)).  FURTHER DISCHARGE INSTRUCTIONS:  Get Medicines reviewed and adjusted: Please take all your medications with you for your next visit with your Primary MD  Laboratory/radiological data: Please request your Primary MD to go over all hospital tests and procedure/radiological results at the follow up, please ask your Primary MD to get all Hospital records sent to his/her office.  In some cases, they will be blood work, cultures and biopsy results pending at the time of your discharge. Please request that your primary care  M.D. goes through all the records of your hospital data and follows up on these results.  Also Note the following: If you experience worsening of your admission symptoms, develop shortness of breath, life threatening emergency, suicidal or homicidal thoughts you must seek medical attention immediately by calling 911 or calling your MD immediately  if symptoms less severe.  You must read complete instructions/literature along with all the possible adverse reactions/side effects for all the Medicines you take and that have been prescribed to you. Take any new Medicines after you have completely understood and accpet all the possible adverse reactions/side effects.   Do not drive when taking Pain medications or sleeping medications (Benzodaizepines)  Do not take more than prescribed Pain, Sleep and Anxiety Medications. It is not advisable to combine anxiety,sleep and pain medications without talking with your primary care practitioner  Special Instructions: If you have smoked or chewed Tobacco  in the last 2 yrs please stop smoking, stop any regular Alcohol  and or any Recreational drug use.  Wear Seat belts while driving.  Please note: You were cared for by a hospitalist during your hospital stay. Once you are discharged, your primary care physician will handle any further medical issues. Please note that NO REFILLS for any discharge medications will be authorized once you are discharged, as it is imperative that you return to your primary care physician (or establish a relationship with a primary care physician if you do not have one) for your post hospital discharge needs so that they can reassess your need for medications and monitor your lab values.  Total Time spent coordinating discharge including counseling, education and face to face time equals 25  minutes.  SignedOren Binet 05/14/2016 10:39 AM

## 2016-05-14 NOTE — Progress Notes (Signed)
ANTICOAGULATION CONSULT NOTE - Initial Consult  Pharmacy Consult for Coumadin Indication: h/o PE/DVT  Allergies  Allergen Reactions  . Butalbital-Apap-Caffeine Shortness Of Breath and Swelling    Swelling in throat  . Ferrlecit [Na Ferric Gluc Cplx In Sucrose] Shortness Of Breath, Swelling and Other (See Comments)    Swelling in throat  . Minoxidil Shortness Of Breath  . Darvocet [Propoxyphene N-Acetaminophen] Hives  . Other Hives    Allergy listed on file from [facility] (Pt did not mention any allergies) Pt able to do norco (tylenol)    Vital Signs: Temp: 98.3 F (36.8 C) (10/23 2300) Temp Source: Oral (10/23 2300) BP: 145/100 (10/24 0200) Pulse Rate: 88 (10/24 0200)  Labs:  Recent Labs  05/13/16 1800  HGB 8.4*  HCT 26.8*  PLT 177  CREATININE 16.63*    Estimated Creatinine Clearance: 5.4 mL/min (by C-G formula based on SCr of 16.63 mg/dL (H)).   Medical History: Past Medical History:  Diagnosis Date  . Anemia   . Anxiety   . Chronic combined systolic and diastolic CHF (congestive heart failure) (HCC)    a. EF 20-25% by echo in 08/2015 b. echo 10/2015: EF 35-40%, diffuse HK, severe LAE, moderate RAE, small pericardial effusion  . Complication of anesthesia    itching, sore throat  . Depression   . Dialysis patient ([place])   . ESRD (end stage renal disease) ([place])    due to HTN per patient, followed at [facility], s/p failed kidney transplant - dialysis Tue, Th, Sat  . Hyperkalemia 12/2015  . Hypertension   . Junctional rhythm    a. noted in 08/2015: hyperkalemic at that time  b. 12/2015: presented in junctional rhythm w/ K+ of 6.6. Resolved with improvement of K+ levels.  . Nonischemic cardiomyopathy ([place])    a. 08/2014: cath showing minimal CAD, but tortuous arteries noted.   . Renal insufficiency   . Shortness of breath   . Type II diabetes mellitus (HCC)    No history per patient, but remains under history as A1c would not be accurate given on dialysis     Assessment: 50yo male c/o emesis and LUE pain, missed Sat HD, found to be hyperkalemic, admitted for further w/u, to continue Coumadin for h/o VTE.  Goal of Therapy:  INR 2-3   Plan:  Will obtain current INR prior to dosing Coumadin.  Wynona Neat, PharmD, BCPS  05/14/2016,2:23 AM

## 2016-05-15 DIAGNOSIS — E875 Hyperkalemia: Secondary | ICD-10-CM | POA: Diagnosis not present

## 2016-05-15 DIAGNOSIS — G894 Chronic pain syndrome: Secondary | ICD-10-CM | POA: Diagnosis not present

## 2016-05-15 DIAGNOSIS — I5042 Chronic combined systolic (congestive) and diastolic (congestive) heart failure: Secondary | ICD-10-CM | POA: Diagnosis not present

## 2016-05-15 DIAGNOSIS — D638 Anemia in other chronic diseases classified elsewhere: Secondary | ICD-10-CM | POA: Diagnosis not present

## 2016-05-15 LAB — RENAL FUNCTION PANEL
ANION GAP: 15 (ref 5–15)
Albumin: 3 g/dL — ABNORMAL LOW (ref 3.5–5.0)
BUN: 43 mg/dL — AB (ref 6–20)
CALCIUM: 8.2 mg/dL — AB (ref 8.9–10.3)
CO2: 22 mmol/L (ref 22–32)
Chloride: 98 mmol/L — ABNORMAL LOW (ref 101–111)
Creatinine, Ser: 12.07 mg/dL — ABNORMAL HIGH (ref 0.61–1.24)
GFR calc Af Amer: 5 mL/min — ABNORMAL LOW (ref >60)
GFR calc non Af Amer: 4 mL/min — ABNORMAL LOW (ref >60)
GLUCOSE: 82 mg/dL (ref 65–99)
Phosphorus: 4.7 mg/dL — ABNORMAL HIGH (ref 2.5–4.6)
Potassium: 4.7 mmol/L (ref 3.5–5.1)
SODIUM: 135 mmol/L (ref 135–145)

## 2016-05-15 LAB — CBC
HEMATOCRIT: 25 % — AB (ref 39.0–52.0)
HEMOGLOBIN: 7.9 g/dL — AB (ref 13.0–17.0)
MCH: 27.3 pg (ref 26.0–34.0)
MCHC: 31.6 g/dL (ref 30.0–36.0)
MCV: 86.5 fL (ref 78.0–100.0)
Platelets: 179 10*3/uL (ref 150–400)
RBC: 2.89 MIL/uL — ABNORMAL LOW (ref 4.22–5.81)
RDW: 15.1 % (ref 11.5–15.5)
WBC: 6.9 10*3/uL (ref 4.0–10.5)

## 2016-05-15 LAB — PROTIME-INR
INR: 1.13
Prothrombin Time: 14.6 seconds (ref 11.4–15.2)

## 2016-05-15 LAB — MRSA PCR SCREENING: MRSA by PCR: NEGATIVE

## 2016-05-15 MED ORDER — FAMOTIDINE 20 MG PO TABS: 20.0000 mg | ORAL_TABLET | Freq: Every day | ORAL | Status: DC

## 2016-05-15 NOTE — Discharge Summary (Signed)
Physician Discharge Summary  Frank [name] GNO:[id number] DOB: 07/08/1966 DOA: 05/13/2016  PCP: Angelica Chessman, MD  Admit date: 05/13/2016 Discharge date: 05/15/2016  Admitted From: Home Disposition:  Home  Recommendations for Outpatient Follow-up:  1. Follow up with PCP in 1 weeks 2. Check INR next 2-3 days-on Coumadin  3. Please obtain BMP/CBC in one week  Home Health: No  Equipment/Devices: None   Discharge Condition: Stable CODE STATUS: Full  Diet recommendation: Heart healthy   Brief/Interim Summary: Calan Doren Crowis a 50 y.o.malewith a past medical history significant for ESRD on HD without HD center, failed renal transplant, chronic pain, recurrent DVT on warfarin, ischemic CM/CHF EF 35-40%, history of RCC scheduled for nephrectomy, and HTNwho presents with needing dialysis and is found to have K 6.9. Admitted overnight for hemodialysis.  Discharge Diagnoses:  Principal Problem:   Hyperkalemia Active Problems:   Essential hypertension, malignant   ESRD on dialysis- Tues, Thurs, Sat in [place], nephro: [name]   Chronic combined systolic and diastolic CHF (congestive heart failure) (HCC)   Chronic pain syndrome   History of noncompliance with medical treatment   Anemia of chronic disease  Hyperkalemia: from missed hemodialysis, resolved post dialysis.   ESRD: apparently has been fired hemodialysis centers in [place] claimed that he was in the process of establishing care with [facility] here in [address], however, there are no plans by [facility] to accept him per Dr. Joelyn Oms. Patient is currently without a hemodialysis center, and has been going to numerous local ERs for his dialysis needs. Difficult situation. No further recommendations from nephrology at this point apart from discharge. Patient tells me this morning that he has a couple of "potential" dialysis centers who are willing to take him and assures me that he has phone numbers to  contact them. I discussed with him this morning the importance of dialysis for his well-being and that getting discharged from practices could lead him to eventual death. He voiced understanding.   VQX:IHWTUUEK Imdur, doxazosin, hydralazine, carvedilol, clonidine, and amlodipine  History of CMK:[id number] warfarin-INR is subtherapeutic-claims he missed a few doses of Coumadin due to vomiting. He should resume his Coumadin and follow-up with his primary care practitioner. Compliance remains an issue.   Failed renal transplant: Continue prednisone 10 mg daily  Chronic pain: per H&P-Patient recently fired by his Urologist/opioid prescriber because of stating he was getting dialysis regularly when he was not and also obtaining prescriptions for opioids from ER providers without telling his Urologist. He has chronic abdominal pain which has no clear source per Urology notes. He did ask for pain prescription again this morning; I advised him to follow up with PCP outpatient. No narcotics at discharge.   Discharge Instructions  Discharge Instructions    Diet - low sodium heart healthy    Complete by:  As directed    Discharge instructions    Complete by:  As directed    Follow with Primary MD  Angelica Chessman, MD  and your nephrologist  Your INR is subtherapeutic, please get the INR checked in the next 2-3 days by your primary care M.D.  Please get a complete blood count and chemistry panel checked by your Primary MD at your next visit, and again as instructed by your Primary MD.  Get Medicines reviewed and adjusted: Please take all your medications with you for your next visit with your Primary MD  Laboratory/radiological data: Please request your Primary MD to go over all hospital tests and  procedure/radiological results at the follow up, please ask your Primary MD to get all Hospital records sent to his/her office.  In some cases, they will be blood work, cultures and biopsy results  pending at the time of your discharge. Please request that your primary care M.D. follows up on these results.  Also Note the following: If you experience worsening of your admission symptoms, develop shortness of breath, life threatening emergency, suicidal or homicidal thoughts you must seek medical attention immediately by calling 911 or calling your MD immediately  if symptoms less severe.  You must read complete instructions/literature along with all the possible adverse reactions/side effects for all the Medicines you take and that have been prescribed to you. Take any new Medicines after you have completely understood and accpet all the possible adverse reactions/side effects.   Do not drive when taking Pain medications or sleeping medications (Benzodaizepines)  Do not take more than prescribed Pain, Sleep and Anxiety Medications. It is not advisable to combine anxiety,sleep and pain medications without talking with your primary care practitioner  Special Instructions: If you have smoked or chewed Tobacco  in the last 2 yrs please stop smoking, stop any regular Alcohol  and or any Recreational drug use.  Wear Seat belts while driving.  Please note: You were cared for by a hospitalist during your hospital stay. Once you are discharged, your primary care physician will handle any further medical issues. Please note that NO REFILLS for any discharge medications will be authorized once you are discharged, as it is imperative that you return to your primary care physician (or establish a relationship with a primary care physician if you do not have one) for your post hospital discharge needs so that they can reassess your need for medications and monitor your lab values.   Discharge instructions    Complete by:  As directed    Follow up with PCP in 1 weeks Check INR next 2-3 days-on Coumadin  Please obtain BMP/CBC in one week   Increase activity slowly    Complete by:  As directed         Medication List    STOP taking these medications   promethazine 25 MG tablet Commonly known as:  PHENERGAN     TAKE these medications   allopurinol 100 MG tablet Commonly known as:  ZYLOPRIM Take 100 mg by mouth daily.   amLODipine 10 MG tablet Commonly known as:  NORVASC Take 10 mg by mouth daily.   atorvastatin 40 MG tablet Commonly known as:  LIPITOR Take 1 tablet (40 mg total) by mouth daily at 6 PM.   AURYXIA 1 GM 210 MG(Fe) Tabs Generic drug:  Ferric Citrate Take 420 mg by mouth 3 (three) times daily.   carvedilol 25 MG tablet Commonly known as:  COREG Take 25 mg by mouth 2 (two) times daily with a meal.   cinacalcet 30 MG tablet Commonly known as:  SENSIPAR Take 30 mg by mouth daily.   cloNIDine 0.3 mg/24hr patch Commonly known as:  CATAPRES - Dosed in mg/24 hr Place 0.3 mg onto the skin once a week.   doxazosin 2 MG tablet Commonly known as:  CARDURA Take 2 mg by mouth daily.   famotidine 20 MG tablet Commonly known as:  PEPCID Take 1 tablet (20 mg total) by mouth 2 (two) times daily.   gabapentin 100 MG capsule Commonly known as:  NEURONTIN Take 100 mg by mouth 3 (three) times daily.   hydrALAZINE 100  MG tablet Commonly known as:  APRESOLINE Take 100 mg by mouth every 8 (eight) hours.   isosorbide mononitrate 60 MG 24 hr tablet Commonly known as:  IMDUR Take 1 tablet (60 mg total) by mouth daily.   omeprazole 20 MG capsule Commonly known as:  PRILOSEC Take 20 mg by mouth daily.   ondansetron 4 MG disintegrating tablet Commonly known as:  ZOFRAN ODT 53m ODT q4 hours prn nausea/vomit   predniSONE 10 MG tablet Commonly known as:  DELTASONE Take 10 mg by mouth daily with breakfast.   sevelamer carbonate 800 MG tablet Commonly known as:  RENVELA Take 800-1,600 mg by mouth 3 (three) times daily with meals. 2 tabs three times daily with meals, and 1 tablet with snacks   warfarin 2 MG tablet Commonly known as:  COUMADIN Take 2 mg by mouth  daily. Take with 552mtablet to equal 31m30m warfarin 5 MG tablet Commonly known as:  COUMADIN Take 5 mg by mouth daily. Take with 2mg88mblet to equal 31mg 531m  Follow-up Information    JEGEDE, OLUGBEMIGA, MD. Schedule an appointment as soon as possible for a visit in 1 week(s).   Specialty:  Internal Medicine Contact information: [phone]   Primary nephrologist or your usual dialysis center.   Why:  At your usual schedule         Allergies  Allergen Reactions  . Butalbital-Apap-Caffeine Shortness Of Breath and Swelling    Swelling in throat  . Ferrlecit [Na Ferric Gluc Cplx In Sucrose] Shortness Of Breath, Swelling and Other (See Comments)    Swelling in throat  . Minoxidil Shortness Of Breath  . Darvocet [Propoxyphene N-Acetaminophen] Hives  . Other Hives    [facility] Pt able to do norco (tylenol)    Consultations:  Nephrology    Procedures/Studies: Dg Chest Port 1 View  Result Date: 05/14/2016 CLINICAL DATA:  Acute onset of shortness of breath which began yesterday. Current history of end-stage renal disease on hemodialysis. EXAM: PORTABLE CHEST 1 VIEW COMPARISON:  03/31/2016, 03/11/2016 and earlier. FINDINGS: Cardiac silhouette markedly enlarged, unchanged. Mild pulmonary venous hypertension without overt edema currently. No confluent airspace consolidation. No visible pleural effusions. Right jugular dialysis catheter tip projects over expected location of a cavoatrial junction, unchanged. IMPRESSION: Stable marked cardiomegaly. Pulmonary venous hypertension without overt edema currently. No acute cardiopulmonary disease. Electronically Signed   By: ThomaEvangeline Dakin   On: 05/14/2016 16:44      Subjective: Feeling well this morning. He had walked down to cafeteria without permission to get breakfast. States "I am ready to run out of here." No  complaints.   Discharge Exam: Vitals:   05/15/16 0800 05/15/16 0957  BP: (!) 160/96 (!) 157/90  Pulse: 90 99  Resp:  17  Temp:  97.8 F (36.6 C)   Vitals:   05/14/16 2200 05/15/16 0514 05/15/16 0800 05/15/16 0957  BP: 140/82 (!) 159/88 (!) 160/96 (!) 157/90  Pulse: 80 83 90 99  Resp: _0 Temp: 98.8 F (37.1 C) 98.7 F (37.1 C)  97.8 F (36.6 C)  TempSrc: Oral Oral  Oral  SpO2: 99% 96%  100%  Height:        General: Pt is alert, awake, not in acute distress Cardiovascular: RRR, S1/S2 +, no rubs, no gallops Respiratory: CTA bilaterally, no  wheezing, no rhonchi Abdominal: Soft, NT, ND, bowel sounds + Extremities: no edema, no cyanosis    The results of significant diagnostics from this hospitalization (including imaging, microbiology, ancillary and laboratory) are listed below for reference.     Microbiology: No results found for this or any previous visit (from the past 240 hour(s)).   Labs: BNP (last 3 results)  Recent Labs  07/17/15 2135 09/24/15 0324  BNP >4,500.0* >7,939.0*   Basic Metabolic Panel:  Recent Labs Lab 05/13/16 1800 05/14/16 0502 05/14/16 1820 05/15/16 0558  NA 137 134* 134* 135  K 6.9* 4.5 5.9* 4.7  CL 100* 97* 99* 98*  CO2 _0 GLUCOSE 130* 97 106* 82  BUN 61* 23* 35* 43*  CREATININE 16.63* 8.73* 10.96* 12.07*  CALCIUM 8.8* 8.4* 8.3* 8.2*  PHOS  --   --   --  4.7*   Liver Function Tests:  Recent Labs Lab 05/13/16 1800 05/15/16 0558  AST 36  --   ALT 13*  --   ALKPHOS 135*  --   BILITOT 0.5  --   PROT 7.8  --   ALBUMIN 3.5 3.0*    Recent Labs Lab 05/13/16 1800  LIPASE 54*   No results for input(s): AMMONIA in the last 168 hours. CBC:  Recent Labs Lab 05/13/16 1800 05/14/16 1820 05/15/16 0558  WBC 7.1 6.5 6.9  HGB 8.4* 8.1* 7.9*  HCT 26.8* 25.0* 25.0*  MCV 88.2 86.5 86.5  PLT 177 166 179   Cardiac Enzymes: No results for input(s): CKTOTAL, CKMB, CKMBINDEX, TROPONINI in the last 168  hours. BNP: Invalid input(s): POCBNP CBG:  Recent Labs Lab 05/14/16 0826 05/14/16 1105 05/14/16 1345 05/14/16 2157  GLUCAP 80 103* 98 164*   D-Dimer No results for input(s): DDIMER in the last 72 hours. Hgb A1c No results for input(s): HGBA1C in the last 72 hours. Lipid Profile No results for input(s): CHOL, HDL, LDLCALC, TRIG, CHOLHDL, LDLDIRECT in the last 72 hours. Thyroid function studies No results for input(s): TSH, T4TOTAL, T3FREE, THYROIDAB in the last 72 hours.  Invalid input(s): FREET3 Anemia work up No results for input(s): VITAMINB12, FOLATE, FERRITIN, TIBC, IRON, RETICCTPCT in the last 72 hours. Urinalysis    Component Value Date/Time   COLORURINE YELLOW 10/18/2013 0419   APPEARANCEUR CLEAR 10/18/2013 0419   LABSPEC 1.008 10/18/2013 0419   PHURINE 8.5 (H) 10/18/2013 0419   GLUCOSEU 100 (A) 10/18/2013 0419   HGBUR TRACE (A) 10/18/2013 0419   BILIRUBINUR NEGATIVE 10/18/2013 0419   KETONESUR NEGATIVE 10/18/2013 0419   PROTEINUR 100 (A) 10/18/2013 0419   UROBILINOGEN 0.2 10/18/2013 0419   NITRITE NEGATIVE 10/18/2013 0419   LEUKOCYTESUR NEGATIVE 10/18/2013 0419   Sepsis Labs Invalid input(s): PROCALCITONIN,  WBC,  LACTICIDVEN Microbiology No results found for this or any previous visit (from the past 240 hour(s)).   Time coordinating discharge: Less than 30 minutes  SIGNED:  Dessa Phi, DO [facility] Pager [phone]  If 7PM-7AM, please contact night-coverage [email or web address] Password [email or web address] 05/15/2016, 10:02 AM

## 2016-05-15 NOTE — Progress Notes (Signed)
After further conversation pt agrees to take evening medications, not including his Pepcid. Dorthey Sawyer, RN

## 2016-05-15 NOTE — Progress Notes (Signed)
Patient discharged. Frank [name] was offered hemodialysis but refused. Also refused wheelchair, but was alert and oriented and had a steady gait. Ambulated to exit with staff member. Provided and reviewed AVS. No IV access at discharge. Instructed to follow up with nephrologist.

## 2016-05-16 ENCOUNTER — Encounter (HOSPITAL_COMMUNITY): Payer: Self-pay | Admitting: *Deleted

## 2016-05-16 ENCOUNTER — Telehealth: Payer: Self-pay | Admitting: Internal Medicine

## 2016-05-16 ENCOUNTER — Emergency Department (HOSPITAL_COMMUNITY)
Admission: EM | Admit: 2016-05-16 | Discharge: 2016-05-16 | Disposition: A | Discharge status: Home / Self Care | Payer: Medicare Other | Attending: Emergency Medicine | Admitting: Emergency Medicine

## 2016-05-16 DIAGNOSIS — R112 Nausea with vomiting, unspecified: Secondary | ICD-10-CM | POA: Insufficient documentation

## 2016-05-16 DIAGNOSIS — E1122 Type 2 diabetes mellitus with diabetic chronic kidney disease: Secondary | ICD-10-CM | POA: Diagnosis not present

## 2016-05-16 DIAGNOSIS — Z87891 Personal history of nicotine dependence: Secondary | ICD-10-CM | POA: Insufficient documentation

## 2016-05-16 DIAGNOSIS — G8929 Other chronic pain: Secondary | ICD-10-CM | POA: Insufficient documentation

## 2016-05-16 DIAGNOSIS — Z992 Dependence on renal dialysis: Secondary | ICD-10-CM | POA: Insufficient documentation

## 2016-05-16 DIAGNOSIS — I5042 Chronic combined systolic (congestive) and diastolic (congestive) heart failure: Secondary | ICD-10-CM | POA: Diagnosis not present

## 2016-05-16 DIAGNOSIS — I132 Hypertensive heart and chronic kidney disease with heart failure and with stage 5 chronic kidney disease, or end stage renal disease: Secondary | ICD-10-CM | POA: Insufficient documentation

## 2016-05-16 DIAGNOSIS — M545 Low back pain, unspecified: Secondary | ICD-10-CM

## 2016-05-16 DIAGNOSIS — I120 Hypertensive chronic kidney disease with stage 5 chronic kidney disease or end stage renal disease: Secondary | ICD-10-CM | POA: Diagnosis not present

## 2016-05-16 DIAGNOSIS — N189 Chronic kidney disease, unspecified: Secondary | ICD-10-CM | POA: Diagnosis not present

## 2016-05-16 DIAGNOSIS — I517 Cardiomegaly: Secondary | ICD-10-CM | POA: Diagnosis not present

## 2016-05-16 DIAGNOSIS — N186 End stage renal disease: Secondary | ICD-10-CM | POA: Insufficient documentation

## 2016-05-16 DIAGNOSIS — Z7901 Long term (current) use of anticoagulants: Secondary | ICD-10-CM | POA: Diagnosis not present

## 2016-05-16 DIAGNOSIS — R791 Abnormal coagulation profile: Secondary | ICD-10-CM

## 2016-05-16 LAB — CBC
HEMATOCRIT: 25.4 % — AB (ref 39.0–52.0)
Hemoglobin: 8.4 g/dL — ABNORMAL LOW (ref 13.0–17.0)
MCH: 28.8 pg (ref 26.0–34.0)
MCHC: 33.1 g/dL (ref 30.0–36.0)
MCV: 87 fL (ref 78.0–100.0)
Platelets: 202 10*3/uL (ref 150–400)
RBC: 2.92 MIL/uL — ABNORMAL LOW (ref 4.22–5.81)
RDW: 15.1 % (ref 11.5–15.5)
WBC: 5.6 10*3/uL (ref 4.0–10.5)

## 2016-05-16 LAB — BASIC METABOLIC PANEL
Anion gap: 16 — ABNORMAL HIGH (ref 5–15)
BUN: 60 mg/dL — AB (ref 6–20)
CALCIUM: 7.6 mg/dL — AB (ref 8.9–10.3)
CHLORIDE: 97 mmol/L — AB (ref 101–111)
CO2: 23 mmol/L (ref 22–32)
CREATININE: 15.16 mg/dL — AB (ref 0.61–1.24)
GFR calc non Af Amer: 3 mL/min — ABNORMAL LOW (ref >60)
GFR, EST AFRICAN AMERICAN: 4 mL/min — AB (ref >60)
Glucose, Bld: 144 mg/dL — ABNORMAL HIGH (ref 65–99)
Potassium: 5.6 mmol/L — ABNORMAL HIGH (ref 3.5–5.1)
SODIUM: 136 mmol/L (ref 135–145)

## 2016-05-16 LAB — PROTIME-INR
INR: 1.17
Prothrombin Time: 15 seconds (ref 11.4–15.2)

## 2016-05-16 MED ORDER — ONDANSETRON 4 MG PO TBDP: 4.0000 mg | ORAL_TABLET | Freq: Three times a day (TID) | ORAL | 0 refills | Status: DC | PRN

## 2016-05-16 MED ORDER — OXYCODONE-ACETAMINOPHEN 5-325 MG PO TABS
1.0000 | ORAL_TABLET | Freq: Once | ORAL | Status: AC
  Administered 2016-05-16: 1 via ORAL
  Filled 2016-05-16: qty 1

## 2016-05-16 NOTE — Telephone Encounter (Signed)
Dr Doreene Burke refer Mr Milley to [facility]   and I still waiting for Dr Doreene Burke to close the  notes so, I can process the referral thank you .

## 2016-05-16 NOTE — Telephone Encounter (Signed)
Dr Doreene Burke refer Mr Brink to Urology  and I still waiting for Dr Doreene Burke to close the  notes so, I can process the referral thank you .

## 2016-05-16 NOTE — ED Triage Notes (Signed)
Pt reports that he is here for dialysis and that he has chronic back pain.  Pt states that his last HD treatment was Monday night and that he has some sob.  No acute distress at this time.

## 2016-05-16 NOTE — ED Provider Notes (Signed)
[facility] DEPT Provider Note   CSN: [id number] Arrival date & time: 05/16/16  2021     History   Chief Complaint Chief Complaint  Patient presents with  . Vascular Access Problem    HPI KAZ AULD is a 50 y.o. male.  HPI  50 year old male presents with right lower back pain, nausea and vomiting, and requesting dialysis. Patient states his last dialysis was 3 nights ago. His back has been hurting for the last 2 days, this is an acute exacerbation of chronic low back pain. He states he's had back pain for at least 6 months and probably more. He used to see a pain specialist and be on Percocet but was dropped from the clinic. Back pain does not radiate. No injuries. No weakness or numbness. He has been vomiting for the last 2 days and unable to keep down meds. Denies shortness of breath. States he came at this late at time of night because he did not want have to wait in the waiting room during a busy day. Denies chest pain. Patient tells me he has no dialysis center that he can go to and has to come here for dialysis.  Past Medical History:  Diagnosis Date  . Anemia   . Anxiety   . Chronic combined systolic and diastolic CHF (congestive heart failure) (HCC)    a. EF 20-25% by echo in 08/2015 b. echo 10/2015: EF 35-40%, diffuse HK, severe LAE, moderate RAE, small pericardial effusion  . Complication of anesthesia    itching, sore throat  . Depression   . Dialysis patient ([place])   . ESRD (end stage renal disease) ([place])    due to HTN per patient, followed at [facility], s/p failed kidney transplant - dialysis Tue, Th, Sat  . Hyperkalemia 12/2015  . Hypertension   . Junctional rhythm    a. noted in 08/2015: hyperkalemic at that time  b. 12/2015: presented in junctional rhythm w/ K+ of 6.6. Resolved with improvement of K+ levels.  . Nonischemic cardiomyopathy ([place])    a. 08/2014: cath showing minimal CAD, but tortuous arteries noted.   . Renal insufficiency   . Shortness of breath    . Type II diabetes mellitus (HCC)    No history per patient, but remains under history as A1c would not be accurate given on dialysis    Patient Active Problem List   Diagnosis Date Noted  . Bradycardia 03/31/2016  . Uremia 03/11/2016  . Nonischemic cardiomyopathy ([place]) 01/09/2016  . Chronic abdominal pain   . ESRD (end stage renal disease) on dialysis ([place])   . SOB (shortness of breath)   . Generalized abdominal pain   . Faintness   . Dyspnea 01/07/2016  . Hyperkalemia 12/16/2015  . Bilateral low back pain without sciatica   . Pericardial effusion 10/30/2015  . Left renal mass 10/30/2015  . Constipation 10/30/2015  . Hypertensive urgency 09/08/2015  . Adjustment disorder with mixed anxiety and depressed mood 08/20/2015  . Chronic epididymitis 06/19/2015  . Groin pain, chronic, right 06/19/2015  . Incarcerated right inguinal hernia 02/16/2015  . Malnutrition of moderate degree ([place]) 02/03/2015  . Left upper extremity deep vein thrombosis ([place]) 02/01/2015  . Drug-seeking behavior 02/01/2015  . Anemia of chronic disease   . Dyslipidemia   . Chronic pulmonary embolism ([place])   . History of noncompliance with medical treatment   . Chronic combined systolic and diastolic CHF (congestive heart failure) ([place])   . Chronic pain syndrome   . Essential  hypertension, malignant 04/13/2013  . ESRD on dialysis- Tues, Thurs, Sat in [place], nephro: [name] 04/13/2013    Past Surgical History:  Procedure Laterality Date  . CAPD INSERTION    . CAPD REMOVAL    . INGUINAL HERNIA REPAIR Right 02/14/2015   Procedure: REPAIR INCARCERATED RIGHT INGUINAL HERNIA;  Surgeon: [name], MD;  Location: [facility];  Service: General;  Laterality: Right;  . INSERTION OF DIALYSIS CATHETER Right 09/23/2015   Procedure: exchange of Right internal Dialysis Catheter.;  Surgeon: [name], MD;  Location: [facility];  Service: Vascular;  Laterality: Right;  . KIDNEY RECEIPIENT  2006   failed and started HD  in March 2014  . LEFT HEART CATHETERIZATION WITH CORONARY ANGIOGRAM N/A 09/02/2014   Procedure: LEFT HEART CATHETERIZATION WITH CORONARY ANGIOGRAM;  Surgeon: [name], MD;  Location[facility];  Service: Cardiovascular;  Laterality: N/A;       Home Medications    Prior to Admission medications   Medication Sig Start Date End Date Taking? Authorizing Provider  allopurinol (ZYLOPRIM) 100 MG tablet Take 100 mg by mouth daily.   Yes Historical Provider, MD  amLODipine (NORVASC) 10 MG tablet Take 10 mg by mouth daily.   Yes Historical Provider, MD  atorvastatin (LIPITOR) 40 MG tablet Take 1 tablet (40 mg total) by mouth daily at 6 PM. 09/02/14  Yes Barton Dubois, MD  AURYXIA 1 GM 210 MG(Fe) TABS Take 420 mg by mouth 3 (three) times daily. 01/26/16  Yes Historical Provider, MD  carvedilol (COREG) 25 MG tablet Take 25 mg by mouth 2 (two) times daily with a meal.   Yes Historical Provider, MD  cinacalcet (SENSIPAR) 30 MG tablet Take 30 mg by mouth daily.   Yes Historical Provider, MD  cloNIDine (CATAPRES - DOSED IN MG/24 HR) 0.3 mg/24hr patch Place 0.3 mg onto the skin once a week.   Yes Historical Provider, MD  doxazosin (CARDURA) 2 MG tablet Take 2 mg by mouth daily.   Yes Historical Provider, MD  famotidine (PEPCID) 20 MG tablet Take 1 tablet (20 mg total) by mouth 2 (two) times daily. 02/13/16  Yes Charlesetta Shanks, MD  hydrALAZINE (APRESOLINE) 100 MG tablet Take 100 mg by mouth every 8 (eight) hours.   Yes Historical Provider, MD  isosorbide mononitrate (IMDUR) 60 MG 24 hr tablet Take 1 tablet (60 mg total) by mouth daily. 01/09/16  Yes Smiley Houseman, MD  omeprazole (PRILOSEC) 20 MG capsule Take 20 mg by mouth daily. 07/01/13  Yes Historical Provider, MD  oxycodone (OXY-IR) 5 MG capsule Take 5 mg by mouth every 4 (four) hours as needed.   Yes Historical Provider, MD  predniSONE (DELTASONE) 10 MG tablet Take 10 mg by mouth daily with breakfast.   Yes Historical Provider, MD  sevelamer  carbonate (RENVELA) 800 MG tablet Take 800-1,600 mg by mouth 3 (three) times daily with meals. 2 tabs three times daily with meals, and 1 tablet with snacks   Yes Historical Provider, MD  warfarin (COUMADIN) 2 MG tablet Take 2 mg by mouth daily. Take with 69m tablet to equal 721m7/12/17  Yes Historical Provider, MD  warfarin (COUMADIN) 5 MG tablet Take 5 mg by mouth daily. Take with 32m64mablet to equal 7mg66m12/17  Yes Historical Provider, MD  gabapentin (NEURONTIN) 100 MG capsule Take 100 mg by mouth 3 (three) times daily.    Historical Provider, MD  ondansetron (ZOFRAN ODT) 4 MG disintegrating tablet Take 1 tablet (4  mg total) by mouth every 8 (eight) hours as needed for nausea or vomiting. 05/16/16   Sherwood Gambler, MD    Family History Family History  Problem Relation Age of Onset  . Hypertension Other     Social History Social History  Substance Use Topics  . Smoking status: Former Smoker    Packs/day: 0.00    Years: 1.00    Types: Cigarettes  . Smokeless tobacco: Never Used     Comment: quit Jan 2014  . Alcohol use No     Allergies   Butalbital-apap-caffeine; Ferrlecit [na ferric gluc cplx in sucrose]; Minoxidil; Darvocet [propoxyphene n-acetaminophen]; and Other   Review of Systems Review of Systems  Respiratory: Negative for shortness of breath.   Cardiovascular: Negative for chest pain.  Gastrointestinal: Positive for nausea and vomiting. Negative for abdominal pain.  Musculoskeletal: Positive for back pain.  Neurological: Negative for weakness and numbness.  All other systems reviewed and are negative.    Physical Exam Updated Vital Signs BP 146/95   Pulse 86   Temp 98.4 F (36.9 C) (Oral)   Resp 14   Ht _0  (1.88 m)   Wt 168 lb 2 oz (76.3 kg)   SpO2 99%   BMI 21.59 kg/m   Physical Exam  Constitutional: He is oriented to person, place, and time. He appears well-developed and well-nourished. No distress.  Resting comfortably  HENT:  Head:  Normocephalic and atraumatic.  Right Ear: External ear normal.  Left Ear: External ear normal.  Nose: Nose normal.  Mouth/Throat: Oropharynx is clear and moist.  Eyes: Right eye exhibits no discharge. Left eye exhibits no discharge.  Neck: Neck supple.  Cardiovascular: Normal rate, regular rhythm and normal heart sounds.   Pulmonary/Chest: Effort normal and breath sounds normal. He has no rales.  Abdominal: Soft. He exhibits no distension. There is no tenderness.  Musculoskeletal: He exhibits no edema.       Lumbar back: He exhibits tenderness (mild, right lower back). He exhibits no bony tenderness.       Back:  Neurological: He is alert and oriented to person, place, and time.  5/5 strength in BLE. Normal gross sensation  Skin: Skin is warm and dry. He is not diaphoretic.  Nursing note and vitals reviewed.    ED Treatments / Results  Labs (all labs ordered are listed, but only abnormal results are displayed) Labs Reviewed  CBC - Abnormal; Notable for the following:       Result Value   RBC 2.92 (*)    Hemoglobin 8.4 (*)    HCT 25.4 (*)    All other components within normal limits  BASIC METABOLIC PANEL - Abnormal; Notable for the following:    Potassium 5.6 (*)    Chloride 97 (*)    Glucose, Bld 144 (*)    BUN 60 (*)    Creatinine, Ser 15.16 (*)    Calcium 7.6 (*)    GFR calc non Af Amer 3 (*)    GFR calc Af Amer 4 (*)    Anion gap 16 (*)    All other components within normal limits  PROTIME-INR    EKG  EKG Interpretation  Date/Time:  Thursday May 16 2016 21:41:19 EDT Ventricular Rate:  78 PR Interval:    QRS Duration: 119 QT Interval:  438 QTC Calculation: 499 R Axis:   -13 Text Interpretation:  Sinus rhythm Probable LVH with secondary repol abnrm Borderline prolonged QT interval no significant change since 3 days  ago Confirmed by Regenia Skeeter MD, [place] [id number]) on 05/16/2016 9:43:41 PM       Radiology No results found.  Procedures Procedures  (including critical care time)  Medications Ordered in ED Medications  oxyCODONE-acetaminophen (PERCOCET/ROXICET) 5-325 MG per tablet 1 tablet (1 tablet Oral Given 05/16/16 2123)     Initial Impression / Assessment and Plan / ED Course  I have reviewed the triage vital signs and the nursing notes.  Pertinent labs & imaging results that were available during my care of the patient were reviewed by me and considered in my medical decision making (see chart for details).  Clinical Course  Comment By Time  Will check labs. Appears to have chronic back pain, one dose of percocet while waiting on labs. Overall appears well, will contact renal when potassium is back. Sherwood Gambler, MD 10/26 2107  D/w Dr. Mercy Moore, no indication for emergent dialysis, call his center tomorrow for dialysis Sherwood Gambler, MD 10/26 2150    Patient is overall well appearing. On further talking to patient, he states that his INR is subtherapeutic because he has not been able to take it due to vomiting. However if this was the case several the last couple days and would think that his INR would be higher than just over 1. I suspect he does not take it at baseline. He also denies that he has had dialysis anywhere but here in the last month, then retracts this and states he has been going to [facility] and previous nephrology notes show that he has a spot at [facility]. Encouraged to follow-up at one of these locations tomorrow for dialysis. EKG with no acute hyperkalemic changes. No vomiting while in ED for multiple hours. Will d/c with zofran.  Final Clinical Impressions(s) / ED Diagnoses   Final diagnoses:  Chronic right-sided low back pain without sciatica  ESRD (end stage renal disease) (HCC)  Nausea and vomiting in adult  Subtherapeutic international normalized ratio (INR)    New Prescriptions New Prescriptions   ONDANSETRON (ZOFRAN ODT) 4 MG DISINTEGRATING TABLET    Take 1 tablet (4 mg total) by mouth every 8  (eight) hours as needed for nausea or vomiting.     Sherwood Gambler, MD 05/16/16 2257

## 2016-05-16 NOTE — ED Provider Notes (Signed)
[facility] DEPT Provider Note   CSN: [id number] Arrival date & time: 05/13/16  1720     History   Chief Complaint Chief Complaint  Patient presents with  . Emesis  . Arm Pain    HPI Frank [name] is a 50 y.o. male.     N/v beginning yesterday, about 6 episodes. No blood. No abdominal pain. Last dialysis Thursday, tried to go yesterday but was told "it wasn't time".  Also reports back pain, severe. No trauma, no loss control bowel, does not make urine, no numbness weakness, no fevers. Left arm pain as well over fistula site.     Past Medical History:  Diagnosis Date  . Anemia   . Anxiety   . Chronic combined systolic and diastolic CHF (congestive heart failure) (HCC)    a. EF 20-25% by echo in 08/2015 b. echo 10/2015: EF 35-40%, diffuse HK, severe LAE, moderate RAE, small pericardial effusion  . Complication of anesthesia    itching, sore throat  . Depression   . Dialysis patient ([place])   . ESRD (end stage renal disease) ([place])    due to HTN per patient, followed at [facility], s/p failed kidney transplant - dialysis Tue, Th, Sat  . Hyperkalemia 12/2015  . Hypertension   . Junctional rhythm    a. noted in 08/2015: hyperkalemic at that time  b. 12/2015: presented in junctional rhythm w/ K+ of 6.6. Resolved with improvement of K+ levels.  . Nonischemic cardiomyopathy ([place])    a. 08/2014: cath showing minimal CAD, but tortuous arteries noted.   . Renal insufficiency   . Shortness of breath   . Type II diabetes mellitus (HCC)    No history per patient, but remains under history as A1c would not be accurate given on dialysis    Patient Active Problem List   Diagnosis Date Noted  . Bradycardia 03/31/2016  . Uremia 03/11/2016  . Nonischemic cardiomyopathy ([place]) 01/09/2016  . Chronic abdominal pain   . ESRD (end stage renal disease) on dialysis ([place])   . SOB (shortness of breath)   . Generalized abdominal pain   . Faintness   . Dyspnea 01/07/2016  . Hyperkalemia  12/16/2015  . Bilateral low back pain without sciatica   . Pericardial effusion 10/30/2015  . Left renal mass 10/30/2015  . Constipation 10/30/2015  . Hypertensive urgency 09/08/2015  . Adjustment disorder with mixed anxiety and depressed mood 08/20/2015  . Chronic epididymitis 06/19/2015  . Groin pain, chronic, right 06/19/2015  . Incarcerated right inguinal hernia 02/16/2015  . Malnutrition of moderate degree ([place]) 02/03/2015  . Left upper extremity deep vein thrombosis ([place]) 02/01/2015  . Drug-seeking behavior 02/01/2015  . Anemia of chronic disease   . Dyslipidemia   . Chronic pulmonary embolism ([place])   . History of noncompliance with medical treatment   . Chronic combined systolic and diastolic CHF (congestive heart failure) ([place])   . Chronic pain syndrome   . Essential hypertension, malignant 04/13/2013  . ESRD on dialysis- Tues, Thurs, Sat in [place], nephro: [name] 04/13/2013    Past Surgical History:  Procedure Laterality Date  . CAPD INSERTION    . CAPD REMOVAL    . INGUINAL HERNIA REPAIR Right 02/14/2015   Procedure: REPAIR INCARCERATED RIGHT INGUINAL HERNIA;  Surgeon: [name], MD;  Location: [facility];  Service: General;  Laterality: Right;  . INSERTION OF DIALYSIS CATHETER Right 09/23/2015   Procedure: exchange of Right internal Dialysis Catheter.;  Surgeon: [name], MD;  Location: [facility] OR;  Service: Vascular;  Laterality: Right;  . KIDNEY RECEIPIENT  2006   failed and started HD in March 2014  . LEFT HEART CATHETERIZATION WITH CORONARY ANGIOGRAM N/A 09/02/2014   Procedure: LEFT HEART CATHETERIZATION WITH CORONARY ANGIOGRAM;  Surgeon: [name], MD;  Location: [facility] CATH LAB;  Service: Cardiovascular;  Laterality: N/A;       Home Medications    Prior to Admission medications   Medication Sig Start Date End Date Taking? Authorizing Provider  allopurinol (ZYLOPRIM) 100 MG tablet Take 100 mg by mouth daily.   Yes Historical Provider, MD  amLODipine  (NORVASC) 10 MG tablet Take 10 mg by mouth daily.   Yes Historical Provider, MD  atorvastatin (LIPITOR) 40 MG tablet Take 1 tablet (40 mg total) by mouth daily at 6 PM. 09/02/14  Yes Barton Dubois, MD  AURYXIA 1 GM 210 MG(Fe) TABS Take 420 mg by mouth 3 (three) times daily. 01/26/16  Yes Historical Provider, MD  carvedilol (COREG) 25 MG tablet Take 25 mg by mouth 2 (two) times daily with a meal.   Yes Historical Provider, MD  cinacalcet (SENSIPAR) 30 MG tablet Take 30 mg by mouth daily.   Yes Historical Provider, MD  cloNIDine (CATAPRES - DOSED IN MG/24 HR) 0.3 mg/24hr patch Place 0.3 mg onto the skin once a week.   Yes Historical Provider, MD  doxazosin (CARDURA) 2 MG tablet Take 2 mg by mouth daily.   Yes Historical Provider, MD  famotidine (PEPCID) 20 MG tablet Take 1 tablet (20 mg total) by mouth 2 (two) times daily. 02/13/16  Yes Charlesetta Shanks, MD  gabapentin (NEURONTIN) 100 MG capsule Take 100 mg by mouth 3 (three) times daily.   Yes Historical Provider, MD  hydrALAZINE (APRESOLINE) 100 MG tablet Take 100 mg by mouth every 8 (eight) hours.   Yes Historical Provider, MD  isosorbide mononitrate (IMDUR) 60 MG 24 hr tablet Take 1 tablet (60 mg total) by mouth daily. 01/09/16  Yes Smiley Houseman, MD  omeprazole (PRILOSEC) 20 MG capsule Take 20 mg by mouth daily. 07/01/13  Yes Historical Provider, MD  ondansetron (ZOFRAN ODT) 4 MG disintegrating tablet 50m ODT q4 hours prn nausea/vomit 05/02/16  Yes Olugbemiga E JDoreene Burke MD  predniSONE (DELTASONE) 10 MG tablet Take 10 mg by mouth daily with breakfast.   Yes Historical Provider, MD  sevelamer carbonate (RENVELA) 800 MG tablet Take 800-1,600 mg by mouth 3 (three) times daily with meals. 2 tabs three times daily with meals, and 1 tablet with snacks   Yes Historical Provider, MD  warfarin (COUMADIN) 2 MG tablet Take 2 mg by mouth daily. Take with 555mtablet to equal 56m59m/12/17  Yes Historical Provider, MD  warfarin (COUMADIN) 5 MG tablet Take 5 mg by  mouth daily. Take with 2mg55mblet to equal 56mg 256m2/17  Yes Historical Provider, MD  oxycodone (OXY-IR) 5 MG capsule Take 5 mg by mouth every 4 (four) hours as needed.    Historical Provider, MD    Family History Family History  Problem Relation Age of Onset  . Hypertension Other     Social History Social History  Substance Use Topics  . Smoking status: Former Smoker    Packs/day: 0.00    Years: 1.00    Types: Cigarettes  . Smokeless tobacco: Never Used     Comment: quit Jan 2014  . Alcohol use No     Allergies   Butalbital-apap-caffeine; Ferrlecit [na ferric gluc cplx in sucrose]; Minoxidil;  Darvocet [propoxyphene n-acetaminophen]; and Other   Review of Systems Review of Systems  Constitutional: Negative for fever.  HENT: Negative for sore throat.   Eyes: Negative for visual disturbance.  Respiratory: Negative for shortness of breath.   Cardiovascular: Negative for chest pain.  Gastrointestinal: Positive for nausea and vomiting. Negative for abdominal pain.  Genitourinary: Negative for difficulty urinating (does not make urine).  Musculoskeletal: Positive for back pain. Negative for neck stiffness.  Skin: Negative for rash.  Neurological: Negative for syncope, weakness, numbness and headaches.     Physical Exam Updated Vital Signs BP (!) 157/90 (BP Location: Right Arm)   Pulse 99   Temp 97.8 F (36.6 C) (Oral)   Resp 17   Ht _0  (1.88 m)   SpO2 100%   Physical Exam  Constitutional: He is oriented to person, place, and time. He appears well-developed and well-nourished. No distress.  HENT:  Head: Normocephalic and atraumatic.  Eyes: Conjunctivae and EOM are normal.  Neck: Normal range of motion.  Cardiovascular: Normal rate, regular rhythm, normal heart sounds and intact distal pulses.  Exam reveals no gallop and no friction rub.   No murmur heard. Pulmonary/Chest: Effort normal and breath sounds normal. No respiratory distress. He has no wheezes. He has  no rales.  Abdominal: Soft. He exhibits no distension. There is no tenderness. There is no guarding.  Musculoskeletal: He exhibits no edema.       Cervical back: He exhibits no tenderness.       Thoracic back: He exhibits no tenderness.       Lumbar back: He exhibits tenderness.  Neurological: He is alert and oriented to person, place, and time. He has normal strength. No sensory deficit.  Skin: Skin is warm and dry. He is not diaphoretic.  Left forearm with fistula, no erythema, no fluctuance, palpable thrill   Nursing note and vitals reviewed.    ED Treatments / Results  Labs (all labs ordered are listed, but only abnormal results are displayed) Labs Reviewed  LIPASE, BLOOD - Abnormal; Notable for the following:       Result Value   Lipase 54 (*)    All other components within normal limits  COMPREHENSIVE METABOLIC PANEL - Abnormal; Notable for the following:    Potassium 6.9 (*)    Chloride 100 (*)    Glucose, Bld 130 (*)    BUN 61 (*)    Creatinine, Ser 16.63 (*)    Calcium 8.8 (*)    ALT 13 (*)    Alkaline Phosphatase 135 (*)    GFR calc non Af Amer 3 (*)    GFR calc Af Amer 3 (*)    All other components within normal limits  CBC - Abnormal; Notable for the following:    RBC 3.04 (*)    Hemoglobin 8.4 (*)    HCT 26.8 (*)    RDW 15.7 (*)    All other components within normal limits  BASIC METABOLIC PANEL - Abnormal; Notable for the following:    Sodium 134 (*)    Chloride 97 (*)    BUN 23 (*)    Creatinine, Ser 8.73 (*)    Calcium 8.4 (*)    GFR calc non Af Amer 6 (*)    GFR calc Af Amer 7 (*)    All other components within normal limits  GLUCOSE, CAPILLARY - Abnormal; Notable for the following:    Glucose-Capillary 103 (*)    All other components within normal limits  CBC - Abnormal; Notable for the following:    RBC 2.89 (*)    Hemoglobin 8.1 (*)    HCT 25.0 (*)    All other components within normal limits  BASIC METABOLIC PANEL - Abnormal; Notable for  the following:    Sodium 134 (*)    Potassium 5.9 (*)    Chloride 99 (*)    Glucose, Bld 106 (*)    BUN 35 (*)    Creatinine, Ser 10.96 (*)    Calcium 8.3 (*)    GFR calc non Af Amer 5 (*)    GFR calc Af Amer 5 (*)    All other components within normal limits  CBC - Abnormal; Notable for the following:    RBC 2.89 (*)    Hemoglobin 7.9 (*)    HCT 25.0 (*)    All other components within normal limits  RENAL FUNCTION PANEL - Abnormal; Notable for the following:    Chloride 98 (*)    BUN 43 (*)    Creatinine, Ser 12.07 (*)    Calcium 8.2 (*)    Phosphorus 4.7 (*)    Albumin 3.0 (*)    GFR calc non Af Amer 4 (*)    GFR calc Af Amer 5 (*)    All other components within normal limits  GLUCOSE, CAPILLARY - Abnormal; Notable for the following:    Glucose-Capillary 164 (*)    All other components within normal limits  MRSA PCR SCREENING  HEPATITIS B SURFACE ANTIGEN  HEPATITIS B SURFACE ANTIBODY  PROTIME-INR  GLUCOSE, CAPILLARY  GLUCOSE, CAPILLARY  PROTIME-INR    EKG  EKG Interpretation  Date/Time:  Monday May 13 2016 19:29:10 EDT Ventricular Rate:  65 PR Interval:    QRS Duration: 94 QT Interval:  447 QTC Calculation: 465 R Axis:   -19 Text Interpretation:  Sinus rhythm Probable LVH with secondary repol abnrm Anterior ST elevation, probably due to LVH Baseline wander in lead(s) V3 V5 TW appear more peaked in anterior leads in comparison to prior, LVH wiht repol abnormality unchanged Confirmed by [facility] MD, Ariane Ditullio ([id number]) on 05/13/2016 7:42:26 PM       Radiology No results found.  Procedures Procedures (including critical care time)  Medications Ordered in ED Medications  insulin aspart (novoLOG) injection 5 Units (5 Units Intravenous Given 05/13/16 2232)  dextrose 50 % solution 50 mL (50 mLs Intravenous Given 05/13/16 2232)  calcium gluconate 1 g in sodium chloride 0.9 % 100 mL IVPB (1 g Intravenous New Bag/Given 05/13/16 2233)  oxyCODONE-acetaminophen  (PERCOCET/ROXICET) 5-325 MG per tablet 2 tablet (2 tablets Oral Given 05/13/16 1956)  ondansetron (ZOFRAN) injection 4 mg (4 mg Intravenous Given 05/13/16 2231)  warfarin (COUMADIN) tablet 7 mg (7 mg Oral Given 05/14/16 1813)  sodium polystyrene (KAYEXALATE) 15 GM/60ML suspension 30 g (30 g Oral Given 05/14/16 2315)     Initial Impression / Assessment and Plan / ED Course  I have reviewed the triage vital signs and the nursing notes.  Pertinent labs & imaging results that were available during my care of the patient were reviewed by me and considered in my medical decision making (see chart for details).  Clinical Course   50yo male with hx of ESRD on dialysis T-Th-Sat, DVT, ischemic cardiomyopathy, RCC, fistula placement 3 weeks ago with bovine patch, who presents with concern for nausea, vomiting, pain over left fistula.  Potassium 6.9, tw slightly peaked in anterior leads in comparison to prior. Difficult IV placement with delay in  medications. Given calcium, insulin, dextrose. Nephrology consulted. Pt with back pain, no red flags. Left fistula without erythema, no fluctuance. Will admit for further care, dialysis with potassium 6.9.   Final Clinical Impressions(s) / ED Diagnoses   Final diagnoses:  Hyperkalemia  Non-intractable vomiting with nausea, unspecified vomiting type    New Prescriptions Discharge Medication List as of 05/15/2016 11:10 AM       Gareth Morgan, MD 05/16/16 2251

## 2016-05-17 ENCOUNTER — Encounter (HOSPITAL_COMMUNITY): Payer: Self-pay | Admitting: Emergency Medicine

## 2016-05-17 ENCOUNTER — Emergency Department (HOSPITAL_COMMUNITY): Payer: Medicare Other

## 2016-05-17 DIAGNOSIS — I428 Other cardiomyopathies: Secondary | ICD-10-CM | POA: Diagnosis not present

## 2016-05-17 DIAGNOSIS — E875 Hyperkalemia: Secondary | ICD-10-CM | POA: Diagnosis not present

## 2016-05-17 DIAGNOSIS — G8929 Other chronic pain: Secondary | ICD-10-CM | POA: Insufficient documentation

## 2016-05-17 DIAGNOSIS — I132 Hypertensive heart and chronic kidney disease with heart failure and with stage 5 chronic kidney disease, or end stage renal disease: Secondary | ICD-10-CM | POA: Insufficient documentation

## 2016-05-17 DIAGNOSIS — T8612 Kidney transplant failure: Secondary | ICD-10-CM | POA: Diagnosis not present

## 2016-05-17 DIAGNOSIS — I120 Hypertensive chronic kidney disease with stage 5 chronic kidney disease or end stage renal disease: Secondary | ICD-10-CM | POA: Diagnosis not present

## 2016-05-17 DIAGNOSIS — Z940 Kidney transplant status: Secondary | ICD-10-CM | POA: Diagnosis not present

## 2016-05-17 DIAGNOSIS — R112 Nausea with vomiting, unspecified: Secondary | ICD-10-CM

## 2016-05-17 DIAGNOSIS — R079 Chest pain, unspecified: Secondary | ICD-10-CM | POA: Diagnosis not present

## 2016-05-17 DIAGNOSIS — I509 Heart failure, unspecified: Secondary | ICD-10-CM | POA: Diagnosis not present

## 2016-05-17 DIAGNOSIS — Z7901 Long term (current) use of anticoagulants: Secondary | ICD-10-CM

## 2016-05-17 DIAGNOSIS — E8809 Other disorders of plasma-protein metabolism, not elsewhere classified: Secondary | ICD-10-CM | POA: Diagnosis not present

## 2016-05-17 DIAGNOSIS — R0600 Dyspnea, unspecified: Secondary | ICD-10-CM | POA: Diagnosis not present

## 2016-05-17 DIAGNOSIS — N186 End stage renal disease: Secondary | ICD-10-CM | POA: Diagnosis not present

## 2016-05-17 DIAGNOSIS — M545 Low back pain: Secondary | ICD-10-CM

## 2016-05-17 DIAGNOSIS — Z86718 Personal history of other venous thrombosis and embolism: Secondary | ICD-10-CM | POA: Diagnosis not present

## 2016-05-17 DIAGNOSIS — Z87891 Personal history of nicotine dependence: Secondary | ICD-10-CM | POA: Diagnosis not present

## 2016-05-17 DIAGNOSIS — I5042 Chronic combined systolic (congestive) and diastolic (congestive) heart failure: Secondary | ICD-10-CM

## 2016-05-17 DIAGNOSIS — Z4931 Encounter for adequacy testing for hemodialysis: Secondary | ICD-10-CM | POA: Diagnosis not present

## 2016-05-17 DIAGNOSIS — K219 Gastro-esophageal reflux disease without esophagitis: Secondary | ICD-10-CM | POA: Diagnosis not present

## 2016-05-17 DIAGNOSIS — D631 Anemia in chronic kidney disease: Secondary | ICD-10-CM | POA: Diagnosis not present

## 2016-05-17 DIAGNOSIS — Z79899 Other long term (current) drug therapy: Secondary | ICD-10-CM | POA: Diagnosis not present

## 2016-05-17 DIAGNOSIS — Z992 Dependence on renal dialysis: Secondary | ICD-10-CM | POA: Diagnosis not present

## 2016-05-17 DIAGNOSIS — E1122 Type 2 diabetes mellitus with diabetic chronic kidney disease: Secondary | ICD-10-CM

## 2016-05-17 NOTE — Telephone Encounter (Signed)
Ok, thanks.

## 2016-05-17 NOTE — ED Triage Notes (Signed)
Pt. presents with multiple complaints : reports SOB with fatigue , lower back pain and dizziness after dialysis treatment today , alert and oriented / respirations unlabored at triage .

## 2016-05-17 NOTE — Telephone Encounter (Signed)
Frank [name] is needing notes completed for this patient in order to complete referrals.

## 2016-05-18 ENCOUNTER — Emergency Department (HOSPITAL_COMMUNITY)
Admission: EM | Admit: 2016-05-18 | Discharge: 2016-05-18 | Disposition: A | Discharge status: Home / Self Care | Payer: Medicare Other | Source: Home / Self Care | Attending: Emergency Medicine | Admitting: Emergency Medicine

## 2016-05-18 DIAGNOSIS — R112 Nausea with vomiting, unspecified: Secondary | ICD-10-CM

## 2016-05-18 DIAGNOSIS — G8929 Other chronic pain: Secondary | ICD-10-CM

## 2016-05-18 DIAGNOSIS — M545 Low back pain, unspecified: Secondary | ICD-10-CM

## 2016-05-18 LAB — COMPREHENSIVE METABOLIC PANEL
ALBUMIN: 3.3 g/dL — AB (ref 3.5–5.0)
ALT: 14 U/L — ABNORMAL LOW (ref 17–63)
ANION GAP: 11 (ref 5–15)
AST: 34 U/L (ref 15–41)
Alkaline Phosphatase: 138 U/L — ABNORMAL HIGH (ref 38–126)
BUN: 28 mg/dL — ABNORMAL HIGH (ref 6–20)
CHLORIDE: 99 mmol/L — AB (ref 101–111)
CO2: 25 mmol/L (ref 22–32)
Calcium: 8.4 mg/dL — ABNORMAL LOW (ref 8.9–10.3)
Creatinine, Ser: 9.64 mg/dL — ABNORMAL HIGH (ref 0.61–1.24)
GFR calc non Af Amer: 5 mL/min — ABNORMAL LOW (ref >60)
GFR, EST AFRICAN AMERICAN: 6 mL/min — AB (ref >60)
GLUCOSE: 114 mg/dL — AB (ref 65–99)
POTASSIUM: 5 mmol/L (ref 3.5–5.1)
SODIUM: 135 mmol/L (ref 135–145)
Total Bilirubin: 0.5 mg/dL (ref 0.3–1.2)
Total Protein: 7.9 g/dL (ref 6.5–8.1)

## 2016-05-18 LAB — CBC WITH DIFFERENTIAL/PLATELET
BASOS PCT: 0 %
Basophils Absolute: 0 10*3/uL (ref 0.0–0.1)
EOS ABS: 0.2 10*3/uL (ref 0.0–0.7)
EOS PCT: 4 %
HCT: 27.2 % — ABNORMAL LOW (ref 39.0–52.0)
HEMOGLOBIN: 8.6 g/dL — AB (ref 13.0–17.0)
LYMPHS ABS: 0.9 10*3/uL (ref 0.7–4.0)
Lymphocytes Relative: 17 %
MCH: 27.4 pg (ref 26.0–34.0)
MCHC: 31.6 g/dL (ref 30.0–36.0)
MCV: 86.6 fL (ref 78.0–100.0)
MONO ABS: 0.5 10*3/uL (ref 0.1–1.0)
MONOS PCT: 10 %
NEUTROS PCT: 69 %
Neutro Abs: 3.6 10*3/uL (ref 1.7–7.7)
PLATELETS: 183 10*3/uL (ref 150–400)
RBC: 3.14 MIL/uL — ABNORMAL LOW (ref 4.22–5.81)
RDW: 15 % (ref 11.5–15.5)
WBC: 5.2 10*3/uL (ref 4.0–10.5)

## 2016-05-18 MED ORDER — METOCLOPRAMIDE HCL 5 MG/ML IJ SOLN
10.0000 mg | Freq: Once | INTRAMUSCULAR | Status: DC
  Filled 2016-05-18: qty 2

## 2016-05-18 MED ORDER — OXYCODONE-ACETAMINOPHEN 5-325 MG PO TABS
2.0000 | ORAL_TABLET | Freq: Once | ORAL | Status: AC
  Administered 2016-05-18: 2 via ORAL
  Filled 2016-05-18: qty 2

## 2016-05-18 NOTE — ED Notes (Signed)
Pt refused Reglan inj. Pt questioning why we waited 3 hours to provide medication for n/v. Pt did take pain meds.

## 2016-05-18 NOTE — ED Notes (Signed)
Pt reports after leaving hemodialysis last night @ 2130 he began feeling dizzy with n/v x 3. Pt reports this has happened several times recently.

## 2016-05-18 NOTE — ED Provider Notes (Signed)
[facility] DEPT Provider Note   CSN: [id number] Arrival date & time: 05/17/16  2307   By signing my name below, I, Evelene Croon, attest that this documentation has been prepared under the direction and in the presence of Orpah Greek, MD . Electronically Signed: Evelene Croon, Scribe. 05/18/2016. 2:39 AM.   History   Chief Complaint Chief Complaint  Patient presents with  . Shortness of Breath  . Back Pain  . Dizziness    The history is provided by the patient. No language interpreter was used.   HPI Comments:  Frank [name] is a 50 y.o. male with a history of ESRD and chronic pain syndrome,  who presents to the Emergency Department complaining of intermittent dizziness and diaphoresis which began after receiving dialysis yesterday (05/17/16). He reports associated nausea and 2 episodes of vomiting. No alleviating factors noted.    Past Medical History:  Diagnosis Date  . Anemia   . Anxiety   . Chronic combined systolic and diastolic CHF (congestive heart failure) (HCC)    a. EF 20-25% by echo in 08/2015 b. echo 10/2015: EF 35-40%, diffuse HK, severe LAE, moderate RAE, small pericardial effusion  . Complication of anesthesia    itching, sore throat  . Depression   . Dialysis patient ([place])   . ESRD (end stage renal disease) ([place])    due to HTN per patient, followed at [facility], s/p failed kidney transplant - dialysis Tue, Th, Sat  . Hyperkalemia 12/2015  . Hypertension   . Junctional rhythm    a. noted in 08/2015: hyperkalemic at that time  b. 12/2015: presented in junctional rhythm w/ K+ of 6.6. Resolved with improvement of K+ levels.  . Nonischemic cardiomyopathy ([place])    a. 08/2014: cath showing minimal CAD, but tortuous arteries noted.   . Renal insufficiency   . Shortness of breath   . Type II diabetes mellitus (HCC)    No history per patient, but remains under history as A1c would not be accurate given on dialysis    Patient Active Problem List   Diagnosis Date Noted  . Bradycardia 03/31/2016  . Uremia 03/11/2016  . Nonischemic cardiomyopathy ([place]) 01/09/2016  . Chronic abdominal pain   . ESRD (end stage renal disease) on dialysis ([place])   . SOB (shortness of breath)   . Generalized abdominal pain   . Faintness   . Dyspnea 01/07/2016  . Hyperkalemia 12/16/2015  . Bilateral low back pain without sciatica   . Pericardial effusion 10/30/2015  . Left renal mass 10/30/2015  . Constipation 10/30/2015  . Hypertensive urgency 09/08/2015  . Adjustment disorder with mixed anxiety and depressed mood 08/20/2015  . Chronic epididymitis 06/19/2015  . Groin pain, chronic, right 06/19/2015  . Incarcerated right inguinal hernia 02/16/2015  . Malnutrition of moderate degree ([place]) 02/03/2015  . Left upper extremity deep vein thrombosis ([place]) 02/01/2015  . Drug-seeking behavior 02/01/2015  . Anemia of chronic disease   . Dyslipidemia   . Chronic pulmonary embolism ([place])   . History of noncompliance with medical treatment   . Chronic combined systolic and diastolic CHF (congestive heart failure) ([place])   . Chronic pain syndrome   . Essential hypertension, malignant 04/13/2013  . ESRD on dialysis- Tues, Thurs, Sat in [place], nephro: [name] 04/13/2013    Past Surgical History:  Procedure Laterality Date  . CAPD INSERTION    . CAPD REMOVAL    . INGUINAL HERNIA REPAIR Right 02/14/2015   Procedure: REPAIR INCARCERATED RIGHT INGUINAL  HERNIA;  Surgeon: [name], MD;  Location: [facility];  Service: General;  Laterality: Right;  . INSERTION OF DIALYSIS CATHETER Right 09/23/2015   Procedure: exchange of Right internal Dialysis Catheter.;  Surgeon: [name], MD;  Location: [facility];  Service: Vascular;  Laterality: Right;  . KIDNEY RECEIPIENT  2006   failed and started HD in March 2014  . LEFT HEART CATHETERIZATION WITH CORONARY ANGIOGRAM N/A 09/02/2014   Procedure: LEFT HEART CATHETERIZATION WITH CORONARY ANGIOGRAM;  Surgeon: [name], MD;  [facility];  Service: Cardiovascular;  Laterality: N/A;       Home Medications    Prior to Admission medications   Medication Sig Start Date End Date Taking? Authorizing Provider  allopurinol (ZYLOPRIM) 100 MG tablet Take 100 mg by mouth daily.    Historical Provider, MD  amLODipine (NORVASC) 10 MG tablet Take 10 mg by mouth daily.    Historical Provider, MD  atorvastatin (LIPITOR) 40 MG tablet Take 1 tablet (40 mg total) by mouth daily at 6 PM. 09/02/14   Barton Dubois, MD  AURYXIA 1 GM 210 MG(Fe) TABS Take 420 mg by mouth 3 (three) times daily. 01/26/16   Historical Provider, MD  carvedilol (COREG) 25 MG tablet Take 25 mg by mouth 2 (two) times daily with a meal.    Historical Provider, MD  cinacalcet (SENSIPAR) 30 MG tablet Take 30 mg by mouth daily.    Historical Provider, MD  cloNIDine (CATAPRES - DOSED IN MG/24 HR) 0.3 mg/24hr patch Place 0.3 mg onto the skin once a week.    Historical Provider, MD  doxazosin (CARDURA) 2 MG tablet Take 2 mg by mouth daily.    Historical Provider, MD  famotidine (PEPCID) 20 MG tablet Take 1 tablet (20 mg total) by mouth 2 (two) times daily. 02/13/16   Charlesetta Shanks, MD  gabapentin (NEURONTIN) 100 MG capsule Take 100 mg by mouth 3 (three) times daily.    Historical Provider, MD  hydrALAZINE (APRESOLINE) 100 MG tablet Take 100 mg by mouth every 8 (eight) hours.    Historical Provider, MD  isosorbide mononitrate (IMDUR) 60 MG 24 hr tablet Take 1 tablet (60 mg total) by mouth daily. 01/09/16   Smiley Houseman, MD  omeprazole (PRILOSEC) 20 MG capsule Take 20 mg by mouth daily. 07/01/13   Historical Provider, MD  ondansetron (ZOFRAN ODT) 4 MG disintegrating tablet Take 1 tablet (4 mg total) by mouth every 8 (eight) hours as needed for nausea or vomiting. 05/16/16   Sherwood Gambler, MD  oxycodone (OXY-IR) 5 MG capsule Take 5 mg by mouth every 4 (four) hours as needed.    Historical Provider, MD  predniSONE (DELTASONE) 10 MG tablet Take  10 mg by mouth daily with breakfast.    Historical Provider, MD  sevelamer carbonate (RENVELA) 800 MG tablet Take 800-1,600 mg by mouth 3 (three) times daily with meals. 2 tabs three times daily with meals, and 1 tablet with snacks    Historical Provider, MD  warfarin (COUMADIN) 2 MG tablet Take 2 mg by mouth daily. Take with 18m tablet to equal 726m7/12/17   Historical Provider, MD  warfarin (COUMADIN) 5 MG tablet Take 5 mg by mouth daily. Take with 64m48mablet to equal 7mg13m12/17   Historical Provider, MD    Family History Family History  Problem Relation Age of Onset  . Hypertension Other     Social History Social History  Substance Use Topics  . Smoking status: Former  Smoker    Packs/day: 0.00    Years: 1.00    Types: Cigarettes  . Smokeless tobacco: Never Used     Comment: quit Jan 2014  . Alcohol use No     Allergies   Butalbital-apap-caffeine; Ferrlecit [na ferric gluc cplx in sucrose]; Minoxidil; Darvocet [propoxyphene n-acetaminophen]; and Other   Review of Systems Review of Systems  Constitutional: Positive for diaphoresis.  Gastrointestinal: Positive for nausea and vomiting.  Musculoskeletal: Positive for back pain (chronic).  Neurological: Positive for dizziness.  All other systems reviewed and are negative.    Physical Exam Updated Vital Signs BP (!) 145/104 (BP Location: Right Arm)   Pulse 67   Temp 98.2 F (36.8 C) (Oral)   Resp 16   Ht _0  (1.88 m)   Wt 160 lb (72.6 kg)   SpO2 100%   BMI 20.54 kg/m   Physical Exam  Constitutional: He is oriented to person, place, and time. He appears well-developed and well-nourished. No distress.  HENT:  Head: Normocephalic and atraumatic.  Right Ear: Hearing normal.  Left Ear: Hearing normal.  Nose: Nose normal.  Mouth/Throat: Oropharynx is clear and moist and mucous membranes are normal.  Eyes: Conjunctivae and EOM are normal. Pupils are equal, round, and reactive to light.  Neck: Normal range of  motion. Neck supple.  Cardiovascular: Regular rhythm, S1 normal and S2 normal.  Exam reveals no gallop and no friction rub.   No murmur heard. Pulmonary/Chest: Effort normal and breath sounds normal. No respiratory distress. He exhibits no tenderness.  Abdominal: Soft. Normal appearance and bowel sounds are normal. There is no hepatosplenomegaly. There is no tenderness. There is no rebound, no guarding, no tenderness at McBurney's point and negative Murphy's sign. No hernia.  Musculoskeletal: Normal range of motion. He exhibits tenderness.  Diffuse lower lumbar tenderness  Neurological: He is alert and oriented to person, place, and time. He has normal strength. No cranial nerve deficit or sensory deficit. Coordination normal. GCS eye subscore is 4. GCS verbal subscore is 5. GCS motor subscore is 6.  Skin: Skin is warm, dry and intact. No rash noted. No cyanosis.  Psychiatric: He has a normal mood and affect. His speech is normal and behavior is normal. Thought content normal.  Nursing note and vitals reviewed.    ED Treatments / Results  DIAGNOSTIC STUDIES:  Oxygen Saturation is 100% on RA, normal by my interpretation.    COORDINATION OF CARE:  2:36 AM Discussed treatment plan with pt at bedside and pt agreed to plan.  Labs (all labs ordered are listed, but only abnormal results are displayed) Labs Reviewed  CBC WITH DIFFERENTIAL/PLATELET - Abnormal; Notable for the following:       Result Value   RBC 3.14 (*)    Hemoglobin 8.6 (*)    HCT 27.2 (*)    All other components within normal limits  COMPREHENSIVE METABOLIC PANEL - Abnormal; Notable for the following:    Chloride 99 (*)    Glucose, Bld 114 (*)    BUN 28 (*)    Creatinine, Ser 9.64 (*)    Calcium 8.4 (*)    Albumin 3.3 (*)    ALT 14 (*)    Alkaline Phosphatase 138 (*)    GFR calc non Af Amer 5 (*)    GFR calc Af Amer 6 (*)    All other components within normal limits    EKG  EKG  Interpretation  Date/Time:  Friday May 17 2016 23:15:15 EDT  Ventricular Rate:  91 PR Interval:  154 QRS Duration: 88 QT Interval:  398 QTC Calculation: 489 R Axis:   8 Text Interpretation:  Normal sinus rhythm Possible Left atrial enlargement Left ventricular hypertrophy with repolarization abnormality Prolonged QT Abnormal ECG No significant change since last tracing Confirmed by Betsey Holiday  MD, Maelee Hoot [phone]) on 05/18/2016 2:17:34 AM       Radiology Dg Chest 2 View  Result Date: 05/18/2016 CLINICAL DATA:  Dyspnea with fatigue. Low back pain and dizziness after dialysis. EXAM: CHEST  2 VIEW COMPARISON:  05/14/2016 CXR and FINDINGS: Right dialysis catheter tip is seen in the distal SVC. Aortic atherosclerosis without aneurysm is again noted. The cardiac silhouette is top-normal in size allowing for portable technique. Lungs are free of pneumonic consolidations. No effusions or pulmonary edema. No pneumothorax. No suspicious osseous abnormalities. IMPRESSION: No acute cardiopulmonary disease. Electronically Signed   By: Ashley Royalty M.D.   On: 05/18/2016 00:23    Procedures Procedures (including critical care time)  Medications Ordered in ED Medications - No data to display   Initial Impression / Assessment and Plan / ED Course  I have reviewed the triage vital signs and the nursing notes.  Pertinent labs & imaging results that were available during my care of the patient were reviewed by me and considered in my medical decision making (see chart for details).  Clinical Course    Patient presents with complaints of nausea and vomiting after dialysis. Patient reports that he has vomited several times and cannot hold anything down. He continues to have nausea. No specific abdominal pain complaints. No fever. Patient reports that he has severe low back pain. He has a history of chronic low back pain. Pain is unchanged from its chronic nature. Lab work does not reveal any  hyperkalemia. Chest x-ray does not suggest volume overload. Patient treated symptomatically.  Final Clinical Impressions(s) / ED Diagnoses   Final diagnoses:  Non-intractable vomiting with nausea, unspecified vomiting type  Chronic bilateral low back pain without sciatica    New Prescriptions New Prescriptions   No medications on file   I personally performed the services described in this documentation, which was scribed in my presence. The recorded information has been reviewed and is accurate.     Orpah Greek, MD 05/18/16 [phone]

## 2016-05-19 ENCOUNTER — Encounter (HOSPITAL_COMMUNITY): Payer: Self-pay

## 2016-05-19 ENCOUNTER — Inpatient Hospital Stay (HOSPITAL_COMMUNITY)
Admission: EM | Admit: 2016-05-19 | Discharge: 2016-05-21 | DRG: 640 | Disposition: A | Discharge status: Home / Self Care | Payer: Medicare Other | Attending: Internal Medicine | Admitting: Internal Medicine

## 2016-05-19 DIAGNOSIS — Z7901 Long term (current) use of anticoagulants: Secondary | ICD-10-CM

## 2016-05-19 DIAGNOSIS — I5042 Chronic combined systolic (congestive) and diastolic (congestive) heart failure: Secondary | ICD-10-CM | POA: Diagnosis present

## 2016-05-19 DIAGNOSIS — Z9115 Patient's noncompliance with renal dialysis: Secondary | ICD-10-CM

## 2016-05-19 DIAGNOSIS — Z992 Dependence on renal dialysis: Secondary | ICD-10-CM | POA: Diagnosis not present

## 2016-05-19 DIAGNOSIS — Z86718 Personal history of other venous thrombosis and embolism: Secondary | ICD-10-CM

## 2016-05-19 DIAGNOSIS — E875 Hyperkalemia: Secondary | ICD-10-CM | POA: Diagnosis not present

## 2016-05-19 DIAGNOSIS — M545 Low back pain, unspecified: Secondary | ICD-10-CM

## 2016-05-19 DIAGNOSIS — N186 End stage renal disease: Secondary | ICD-10-CM | POA: Diagnosis not present

## 2016-05-19 DIAGNOSIS — G8929 Other chronic pain: Secondary | ICD-10-CM | POA: Diagnosis present

## 2016-05-19 DIAGNOSIS — Z87891 Personal history of nicotine dependence: Secondary | ICD-10-CM

## 2016-05-19 DIAGNOSIS — N2889 Other specified disorders of kidney and ureter: Secondary | ICD-10-CM | POA: Diagnosis present

## 2016-05-19 DIAGNOSIS — E1122 Type 2 diabetes mellitus with diabetic chronic kidney disease: Secondary | ICD-10-CM | POA: Diagnosis present

## 2016-05-19 DIAGNOSIS — R079 Chest pain, unspecified: Secondary | ICD-10-CM | POA: Diagnosis not present

## 2016-05-19 DIAGNOSIS — I428 Other cardiomyopathies: Secondary | ICD-10-CM | POA: Diagnosis present

## 2016-05-19 DIAGNOSIS — Z8249 Family history of ischemic heart disease and other diseases of the circulatory system: Secondary | ICD-10-CM

## 2016-05-19 DIAGNOSIS — I132 Hypertensive heart and chronic kidney disease with heart failure and with stage 5 chronic kidney disease, or end stage renal disease: Secondary | ICD-10-CM | POA: Diagnosis present

## 2016-05-19 DIAGNOSIS — G894 Chronic pain syndrome: Secondary | ICD-10-CM | POA: Diagnosis present

## 2016-05-19 DIAGNOSIS — Z7952 Long term (current) use of systemic steroids: Secondary | ICD-10-CM

## 2016-05-19 DIAGNOSIS — D631 Anemia in chronic kidney disease: Secondary | ICD-10-CM | POA: Diagnosis present

## 2016-05-19 DIAGNOSIS — T8612 Kidney transplant failure: Secondary | ICD-10-CM | POA: Diagnosis present

## 2016-05-19 LAB — COMPREHENSIVE METABOLIC PANEL
ALT: 13 U/L — AB (ref 17–63)
AST: 33 U/L (ref 15–41)
Albumin: 3.2 g/dL — ABNORMAL LOW (ref 3.5–5.0)
Alkaline Phosphatase: 120 U/L (ref 38–126)
Anion gap: 15 (ref 5–15)
BUN: 59 mg/dL — ABNORMAL HIGH (ref 6–20)
CHLORIDE: 98 mmol/L — AB (ref 101–111)
CO2: 23 mmol/L (ref 22–32)
CREATININE: 14.74 mg/dL — AB (ref 0.61–1.24)
Calcium: 8.2 mg/dL — ABNORMAL LOW (ref 8.9–10.3)
GFR calc non Af Amer: 3 mL/min — ABNORMAL LOW (ref >60)
GFR, EST AFRICAN AMERICAN: 4 mL/min — AB (ref >60)
Glucose, Bld: 142 mg/dL — ABNORMAL HIGH (ref 65–99)
POTASSIUM: 5.7 mmol/L — AB (ref 3.5–5.1)
SODIUM: 136 mmol/L (ref 135–145)
Total Bilirubin: 0.7 mg/dL (ref 0.3–1.2)
Total Protein: 7.4 g/dL (ref 6.5–8.1)

## 2016-05-19 LAB — CBC
HEMATOCRIT: 27 % — AB (ref 39.0–52.0)
Hemoglobin: 8.5 g/dL — ABNORMAL LOW (ref 13.0–17.0)
MCH: 27.5 pg (ref 26.0–34.0)
MCHC: 31.5 g/dL (ref 30.0–36.0)
MCV: 87.4 fL (ref 78.0–100.0)
PLATELETS: 182 10*3/uL (ref 150–400)
RBC: 3.09 MIL/uL — AB (ref 4.22–5.81)
RDW: 15.1 % (ref 11.5–15.5)
WBC: 5.5 10*3/uL (ref 4.0–10.5)

## 2016-05-19 LAB — LIPASE, BLOOD: LIPASE: 31 U/L (ref 11–51)

## 2016-05-19 MED ORDER — OXYCODONE-ACETAMINOPHEN 5-325 MG PO TABS
1.0000 | ORAL_TABLET | Freq: Once | ORAL | Status: AC
  Administered 2016-05-19: 1 via ORAL
  Filled 2016-05-19: qty 1

## 2016-05-19 MED ORDER — SODIUM POLYSTYRENE SULFONATE 15 GM/60ML PO SUSP
30.0000 g | Freq: Once | ORAL | Status: AC
Start: 2016-05-19 — End: 2016-05-19
  Administered 2016-05-19: 30 g via ORAL
  Filled 2016-05-19: qty 120

## 2016-05-19 MED ORDER — ONDANSETRON 4 MG PO TBDP
4.0000 mg | ORAL_TABLET | Freq: Once | ORAL | Status: AC
  Administered 2016-05-19: 4 mg via ORAL
  Filled 2016-05-19: qty 1

## 2016-05-19 NOTE — ED Notes (Signed)
Dr.Knott at bedside.

## 2016-05-19 NOTE — ED Provider Notes (Signed)
[facility] DEPT Provider Note   CSN: [id number] Arrival date & time: 05/19/16  1955   History   Chief Complaint Chief Complaint  Patient presents with  . Back Pain    HPI Frank [name] is a 50 y.o. male.  Frank [name] is a 50 yo gentleman with extensive PMH, notably ESRD on HD but recently fired from dialysis in [place], receiving dialysis and pain medication from various EDs in the area who presents with intermittent  nausea/vomiting that began 2 days ago along acute on chronic right lower back pain of 3-4 days. He has had numerous ER presentations recently for similar complaints. He reports vomiting approx 4x daily, yellow fluid, NBNB. He reports appetite loss and poor po intake but no dizziness. His back pain is aching, constant, does not radiate. He reports not having tried any medications for any of his symptoms. He denies fevers, chills, abdominal pain, diarrhea, constipation, chest pain, or shortness of breath.       Past Medical History:  Diagnosis Date  . Anemia   . Anxiety   . Chronic combined systolic and diastolic CHF (congestive heart failure) (HCC)    a. EF 20-25% by echo in 08/2015 b. echo 10/2015: EF 35-40%, diffuse HK, severe LAE, moderate RAE, small pericardial effusion  . Complication of anesthesia    itching, sore throat  . Depression   . Dialysis patient ([place])   . ESRD (end stage renal disease) ([place])    due to HTN per patient, followed at [facility], s/p failed kidney transplant - dialysis Tue, Th, Sat  . Hyperkalemia 12/2015  . Hypertension   . Junctional rhythm    a. noted in 08/2015: hyperkalemic at that time  b. 12/2015: presented in junctional rhythm w/ K+ of 6.6. Resolved with improvement of K+ levels.  . Nonischemic cardiomyopathy ([place])    a. 08/2014: cath showing minimal CAD, but tortuous arteries noted.   . Renal insufficiency   . Shortness of breath   . Type II diabetes mellitus (HCC)    No history per patient, but remains under history as A1c would  not be accurate given on dialysis    Patient Active Problem List   Diagnosis Date Noted  . Bradycardia 03/31/2016  . Uremia 03/11/2016  . Nonischemic cardiomyopathy ([place]) 01/09/2016  . Chronic abdominal pain   . ESRD (end stage renal disease) on dialysis ([place])   . SOB (shortness of breath)   . Generalized abdominal pain   . Faintness   . Dyspnea 01/07/2016  . Hyperkalemia 12/16/2015  . Bilateral low back pain without sciatica   . Pericardial effusion 10/30/2015  . Left renal mass 10/30/2015  . Constipation 10/30/2015  . Hypertensive urgency 09/08/2015  . Adjustment disorder with mixed anxiety and depressed mood 08/20/2015  . Chronic epididymitis 06/19/2015  . Groin pain, chronic, right 06/19/2015  . Incarcerated right inguinal hernia 02/16/2015  . Malnutrition of moderate degree ([place]) 02/03/2015  . Left upper extremity deep vein thrombosis ([place]) 02/01/2015  . Drug-seeking behavior 02/01/2015  . Anemia of chronic disease   . Dyslipidemia   . Chronic pulmonary embolism ([place])   . History of noncompliance with medical treatment   . Chronic combined systolic and diastolic CHF (congestive heart failure) ([place])   . Chronic pain syndrome   . Essential hypertension, malignant 04/13/2013  . ESRD on dialysis- Tues, Thurs, Sat in [place], nephro: [name] 04/13/2013    Past Surgical History:  Procedure Laterality Date  . CAPD INSERTION    .  CAPD REMOVAL    . INGUINAL HERNIA REPAIR Right 02/14/2015   Procedure: REPAIR INCARCERATED RIGHT INGUINAL HERNIA;  Surgeon: [name], MD;  Location: [facility];  Service: General;  Laterality: Right;  . INSERTION OF DIALYSIS CATHETER Right 09/23/2015   Procedure: exchange of Right internal Dialysis Catheter.;  Surgeon: [name], MD;  Location: [facility];  Service: Vascular;  Laterality: Right;  . KIDNEY RECEIPIENT  2006   failed and started HD in March 2014  . LEFT HEART CATHETERIZATION WITH CORONARY ANGIOGRAM N/A 09/02/2014   Procedure: LEFT  HEART CATHETERIZATION WITH CORONARY ANGIOGRAM;  Surgeon: [name], MD;  Location: [facility] CATH LAB;  Service: Cardiovascular;  Laterality: N/A;       Home Medications    Prior to Admission medications   Medication Sig Start Date End Date Taking? Authorizing Provider  allopurinol (ZYLOPRIM) 100 MG tablet Take 100 mg by mouth daily.    Historical Provider, MD  amLODipine (NORVASC) 10 MG tablet Take 10 mg by mouth daily.    Historical Provider, MD  atorvastatin (LIPITOR) 40 MG tablet Take 1 tablet (40 mg total) by mouth daily at 6 PM. 09/02/14   Barton Dubois, MD  AURYXIA 1 GM 210 MG(Fe) TABS Take 420 mg by mouth 3 (three) times daily. 01/26/16   Historical Provider, MD  carvedilol (COREG) 25 MG tablet Take 25 mg by mouth 2 (two) times daily with a meal.    Historical Provider, MD  cinacalcet (SENSIPAR) 30 MG tablet Take 30 mg by mouth daily.    Historical Provider, MD  cloNIDine (CATAPRES - DOSED IN MG/24 HR) 0.3 mg/24hr patch Place 0.3 mg onto the skin once a week.    Historical Provider, MD  doxazosin (CARDURA) 2 MG tablet Take 2 mg by mouth daily.    Historical Provider, MD  famotidine (PEPCID) 20 MG tablet Take 1 tablet (20 mg total) by mouth 2 (two) times daily. 02/13/16   Charlesetta Shanks, MD  gabapentin (NEURONTIN) 100 MG capsule Take 100 mg by mouth 3 (three) times daily.    Historical Provider, MD  hydrALAZINE (APRESOLINE) 100 MG tablet Take 100 mg by mouth every 8 (eight) hours.    Historical Provider, MD  isosorbide mononitrate (IMDUR) 60 MG 24 hr tablet Take 1 tablet (60 mg total) by mouth daily. 01/09/16   Smiley Houseman, MD  omeprazole (PRILOSEC) 20 MG capsule Take 20 mg by mouth daily. 07/01/13   Historical Provider, MD  ondansetron (ZOFRAN ODT) 4 MG disintegrating tablet Take 1 tablet (4 mg total) by mouth every 8 (eight) hours as needed for nausea or vomiting. 05/16/16   Sherwood Gambler, MD  oxycodone (OXY-IR) 5 MG capsule Take 5 mg by mouth every 4 (four) hours as needed.     Historical Provider, MD  predniSONE (DELTASONE) 10 MG tablet Take 10 mg by mouth daily with breakfast.    Historical Provider, MD  sevelamer carbonate (RENVELA) 800 MG tablet Take 800-1,600 mg by mouth 3 (three) times daily with meals. 2 tabs three times daily with meals, and 1 tablet with snacks    Historical Provider, MD  warfarin (COUMADIN) 2 MG tablet Take 2 mg by mouth daily. Take with 61m tablet to equal 729m7/12/17   Historical Provider, MD  warfarin (COUMADIN) 5 MG tablet Take 5 mg by mouth daily. Take with 33m29mablet to equal 7mg36m12/17   Historical Provider, MD    Family History Family History  Problem Relation Age of Onset  . Hypertension  Other     Social History Social History  Substance Use Topics  . Smoking status: Former Smoker    Packs/day: 0.00    Years: 1.00    Types: Cigarettes  . Smokeless tobacco: Never Used     Comment: quit Jan 2014  . Alcohol use No     Allergies   Butalbital-apap-caffeine; Ferrlecit [na ferric gluc cplx in sucrose]; Minoxidil; Darvocet [propoxyphene n-acetaminophen]; and Other   Review of Systems Review of Systems  Constitutional: Positive for appetite change. Negative for chills, fatigue and fever.  Respiratory: Negative for chest tightness and shortness of breath.   Cardiovascular: Negative for chest pain and palpitations.  Gastrointestinal: Positive for nausea and vomiting. Negative for abdominal distention, abdominal pain, constipation and diarrhea.  Genitourinary: Negative for flank pain.  Musculoskeletal: Positive for back pain. Negative for myalgias.  Neurological: Negative for dizziness.  All other systems reviewed and are negative.    Physical Exam Updated Vital Signs BP 142/84   Pulse 78   Temp 97.9 F (36.6 C) (Oral)   Resp 14   Ht _0  (1.88 m)   Wt 72.6 kg   SpO2 92%   BMI 20.54 kg/m   Physical Exam  Constitutional: He is oriented to person, place, and time. He appears well-developed and well-nourished.  No distress.  HENT:  Head: Normocephalic and atraumatic.  Mouth/Throat: Oropharynx is clear and moist.  Eyes: Conjunctivae and EOM are normal. Pupils are equal, round, and reactive to light.  Neck: Normal range of motion. Neck supple.  Cardiovascular: Normal rate, regular rhythm, normal heart sounds and intact distal pulses.  Exam reveals no gallop and no friction rub.   No murmur heard. Pulmonary/Chest: Effort normal and breath sounds normal. No respiratory distress. He has no wheezes. He has no rales. He exhibits no tenderness.  Abdominal: Soft. Bowel sounds are normal. He exhibits no distension and no mass. There is no tenderness. There is no rebound and no guarding.  Musculoskeletal: Normal range of motion. He exhibits tenderness (right paraspinal tenderness and tenderness along lumbar spine). He exhibits no edema or deformity.  Neurological: He is alert and oriented to person, place, and time.  Skin: Skin is warm and dry. Capillary refill takes less than 2 seconds. He is not diaphoretic.  Psychiatric: Thought content normal.  Hiding beneath blankets, guarded/annoyed affect     ED Treatments / Results  Labs (all labs ordered are listed, but only abnormal results are displayed) Labs Reviewed  COMPREHENSIVE METABOLIC PANEL - Abnormal; Notable for the following:       Result Value   Potassium 5.7 (*)    Chloride 98 (*)    Glucose, Bld 142 (*)    BUN 59 (*)    Creatinine, Ser 14.74 (*)    Calcium 8.2 (*)    Albumin 3.2 (*)    ALT 13 (*)    GFR calc non Af Amer 3 (*)    GFR calc Af Amer 4 (*)    All other components within normal limits  CBC - Abnormal; Notable for the following:    RBC 3.09 (*)    Hemoglobin 8.5 (*)    HCT 27.0 (*)    All other components within normal limits  LIPASE, BLOOD  URINALYSIS, ROUTINE W REFLEX MICROSCOPIC (NOT AT [facility])    EKG  EKG Interpretation None       Radiology Dg Chest 2 View  Result Date: 05/18/2016 CLINICAL DATA:  Dyspnea with  fatigue. Low back pain and  dizziness after dialysis. EXAM: CHEST  2 VIEW COMPARISON:  05/14/2016 CXR and FINDINGS: Right dialysis catheter tip is seen in the distal SVC. Aortic atherosclerosis without aneurysm is again noted. The cardiac silhouette is top-normal in size allowing for portable technique. Lungs are free of pneumonic consolidations. No effusions or pulmonary edema. No pneumothorax. No suspicious osseous abnormalities. IMPRESSION: No acute cardiopulmonary disease. Electronically Signed   By: Ashley Royalty M.D.   On: 05/18/2016 00:23    Procedures Procedures (including critical care time)  Medications Ordered in ED Medications  oxyCODONE-acetaminophen (PERCOCET/ROXICET) 5-325 MG per tablet 1 tablet (not administered)  ondansetron (ZOFRAN-ODT) disintegrating tablet 4 mg (4 mg Oral Given 05/19/16 2147)     Initial Impression / Assessment and Plan / ED Course  I have reviewed the triage vital signs and the nursing notes.  Pertinent labs & imaging results that were available during my care of the patient were reviewed by me and considered in my medical decision making (see chart for details).  Clinical Course   Mr. Cabiness returns again for N/V, chronic back pain, needing dialysis. Uremia to BUN 59, hyperkalemia to 5.7 no EKG changes yet. States 3d since last dialysis (at this hospital) and has no place to go for dialysis in future. Provided Zofran ODT and one dose of oxycodone-acetaminophen for symptom relief, no signs of respiratory distress. Reports percocet had no effect on his pain.  Discussed with nephrology and hospitalist service, will admit and nephrology will evaluate tomorrow morning for need to dialyze. Social work will see/evaluate/formulate stable discharge plan.   Final Clinical Impressions(s) / ED Diagnoses   Final diagnoses:  Chronic right-sided low back pain without sciatica  ESRD (end stage renal disease) on dialysis [facility])    New Prescriptions New Prescriptions     No medications on file     Asencion Partridge, MD 05/20/16 1619    Leo Grosser, MD 05/20/16 1630

## 2016-05-19 NOTE — ED Triage Notes (Signed)
Onset yesterday vomiting x 3 today.  Last dialyzed 2 nights ago.  Pt has not been able to take any medications since yesterday d/t vomiting.  Pt c/o chronic back pain.

## 2016-05-20 ENCOUNTER — Encounter (HOSPITAL_COMMUNITY): Payer: Self-pay | Admitting: Family Medicine

## 2016-05-20 DIAGNOSIS — Z8249 Family history of ischemic heart disease and other diseases of the circulatory system: Secondary | ICD-10-CM | POA: Diagnosis not present

## 2016-05-20 DIAGNOSIS — I428 Other cardiomyopathies: Secondary | ICD-10-CM

## 2016-05-20 DIAGNOSIS — D631 Anemia in chronic kidney disease: Secondary | ICD-10-CM | POA: Diagnosis not present

## 2016-05-20 DIAGNOSIS — Z992 Dependence on renal dialysis: Secondary | ICD-10-CM

## 2016-05-20 DIAGNOSIS — I120 Hypertensive chronic kidney disease with stage 5 chronic kidney disease or end stage renal disease: Secondary | ICD-10-CM | POA: Diagnosis not present

## 2016-05-20 DIAGNOSIS — Z87891 Personal history of nicotine dependence: Secondary | ICD-10-CM | POA: Diagnosis not present

## 2016-05-20 DIAGNOSIS — N2581 Secondary hyperparathyroidism of renal origin: Secondary | ICD-10-CM | POA: Diagnosis not present

## 2016-05-20 DIAGNOSIS — N186 End stage renal disease: Secondary | ICD-10-CM | POA: Diagnosis not present

## 2016-05-20 DIAGNOSIS — Z7952 Long term (current) use of systemic steroids: Secondary | ICD-10-CM | POA: Diagnosis not present

## 2016-05-20 DIAGNOSIS — Z86718 Personal history of other venous thrombosis and embolism: Secondary | ICD-10-CM | POA: Diagnosis not present

## 2016-05-20 DIAGNOSIS — M545 Low back pain: Secondary | ICD-10-CM | POA: Diagnosis present

## 2016-05-20 DIAGNOSIS — D638 Anemia in other chronic diseases classified elsewhere: Secondary | ICD-10-CM | POA: Diagnosis not present

## 2016-05-20 DIAGNOSIS — I10 Essential (primary) hypertension: Secondary | ICD-10-CM

## 2016-05-20 DIAGNOSIS — I5042 Chronic combined systolic (congestive) and diastolic (congestive) heart failure: Secondary | ICD-10-CM

## 2016-05-20 DIAGNOSIS — G8929 Other chronic pain: Secondary | ICD-10-CM | POA: Diagnosis present

## 2016-05-20 DIAGNOSIS — E875 Hyperkalemia: Principal | ICD-10-CM

## 2016-05-20 DIAGNOSIS — Z9115 Patient's noncompliance with renal dialysis: Secondary | ICD-10-CM | POA: Diagnosis not present

## 2016-05-20 DIAGNOSIS — E1122 Type 2 diabetes mellitus with diabetic chronic kidney disease: Secondary | ICD-10-CM | POA: Diagnosis present

## 2016-05-20 DIAGNOSIS — N2889 Other specified disorders of kidney and ureter: Secondary | ICD-10-CM | POA: Diagnosis present

## 2016-05-20 DIAGNOSIS — T8612 Kidney transplant failure: Secondary | ICD-10-CM | POA: Diagnosis present

## 2016-05-20 DIAGNOSIS — Z7901 Long term (current) use of anticoagulants: Secondary | ICD-10-CM | POA: Diagnosis not present

## 2016-05-20 DIAGNOSIS — I132 Hypertensive heart and chronic kidney disease with heart failure and with stage 5 chronic kidney disease, or end stage renal disease: Secondary | ICD-10-CM | POA: Diagnosis present

## 2016-05-20 DIAGNOSIS — G894 Chronic pain syndrome: Secondary | ICD-10-CM

## 2016-05-20 LAB — BASIC METABOLIC PANEL
Anion gap: 13 (ref 5–15)
BUN: 65 mg/dL — ABNORMAL HIGH (ref 6–20)
CHLORIDE: 100 mmol/L — AB (ref 101–111)
CO2: 24 mmol/L (ref 22–32)
CREATININE: 15.8 mg/dL — AB (ref 0.61–1.24)
Calcium: 7.7 mg/dL — ABNORMAL LOW (ref 8.9–10.3)
GFR calc non Af Amer: 3 mL/min — ABNORMAL LOW (ref >60)
GFR, EST AFRICAN AMERICAN: 3 mL/min — AB (ref >60)
Glucose, Bld: 78 mg/dL (ref 65–99)
POTASSIUM: 6 mmol/L — AB (ref 3.5–5.1)
Sodium: 137 mmol/L (ref 135–145)

## 2016-05-20 LAB — PROTIME-INR
INR: 1.17
PROTHROMBIN TIME: 14.9 s (ref 11.4–15.2)

## 2016-05-20 MED ORDER — HEPARIN SODIUM (PORCINE) 1000 UNIT/ML DIALYSIS: 40.0000 [IU]/kg | INTRAMUSCULAR | Status: DC | PRN

## 2016-05-20 MED ORDER — SODIUM CHLORIDE 0.9% FLUSH: 3.0000 mL | Freq: Two times a day (BID) | INTRAVENOUS | Status: DC

## 2016-05-20 MED ORDER — WARFARIN SODIUM 5 MG PO TABS: 5.0000 mg | ORAL_TABLET | Freq: Once | ORAL | Status: DC

## 2016-05-20 MED ORDER — ONDANSETRON HCL 4 MG PO TABS
4.0000 mg | ORAL_TABLET | Freq: Four times a day (QID) | ORAL | Status: DC | PRN
  Filled 2016-05-20: qty 1

## 2016-05-20 MED ORDER — LIDOCAINE-PRILOCAINE 2.5-2.5 % EX CREA: 1.0000 "application " | TOPICAL_CREAM | CUTANEOUS | Status: DC | PRN

## 2016-05-20 MED ORDER — WARFARIN - PHARMACIST DOSING INPATIENT: Freq: Every day | Status: DC

## 2016-05-20 MED ORDER — SODIUM CHLORIDE 0.9 % IV SOLN: 100.0000 mL | INTRAVENOUS | Status: DC | PRN

## 2016-05-20 MED ORDER — FERRIC CITRATE 1 GM 210 MG(FE) PO TABS: 420.0000 mg | ORAL_TABLET | Freq: Three times a day (TID) | ORAL | Status: DC

## 2016-05-20 MED ORDER — HYDRALAZINE HCL 50 MG PO TABS
100.0000 mg | ORAL_TABLET | Freq: Three times a day (TID) | ORAL | Status: DC
  Administered 2016-05-20: 100 mg via ORAL
  Filled 2016-05-20 (×3): qty 2

## 2016-05-20 MED ORDER — SEVELAMER CARBONATE 800 MG PO TABS
1600.0000 mg | ORAL_TABLET | Freq: Three times a day (TID) | ORAL | Status: DC
  Administered 2016-05-20 – 2016-05-21 (×3): 1600 mg via ORAL
  Filled 2016-05-20 (×3): qty 2

## 2016-05-20 MED ORDER — ATORVASTATIN CALCIUM 40 MG PO TABS
40.0000 mg | ORAL_TABLET | Freq: Every day | ORAL | Status: DC
  Administered 2016-05-20: 40 mg via ORAL
  Filled 2016-05-20: qty 1

## 2016-05-20 MED ORDER — CINACALCET HCL 30 MG PO TABS
30.0000 mg | ORAL_TABLET | Freq: Every day | ORAL | Status: DC
  Administered 2016-05-20 – 2016-05-21 (×2): 30 mg via ORAL
  Filled 2016-05-20 (×3): qty 1

## 2016-05-20 MED ORDER — HEPARIN SODIUM (PORCINE) 1000 UNIT/ML DIALYSIS: 1000.0000 [IU] | INTRAMUSCULAR | Status: DC | PRN

## 2016-05-20 MED ORDER — PANTOPRAZOLE SODIUM 40 MG PO TBEC
40.0000 mg | DELAYED_RELEASE_TABLET | Freq: Every day | ORAL | Status: DC
  Administered 2016-05-20: 40 mg via ORAL
  Filled 2016-05-20 (×2): qty 1

## 2016-05-20 MED ORDER — ALTEPLASE 2 MG IJ SOLR: 2.0000 mg | Freq: Once | INTRAMUSCULAR | Status: DC | PRN

## 2016-05-20 MED ORDER — ONDANSETRON HCL 4 MG/2ML IJ SOLN
INTRAMUSCULAR | Status: AC
  Filled 2016-05-20: qty 2

## 2016-05-20 MED ORDER — WARFARIN SODIUM 1 MG PO TABS
7.0000 mg | ORAL_TABLET | Freq: Once | ORAL | Status: AC
  Administered 2016-05-20: 7 mg via ORAL
  Filled 2016-05-20: qty 1

## 2016-05-20 MED ORDER — AMLODIPINE BESYLATE 10 MG PO TABS
10.0000 mg | ORAL_TABLET | Freq: Every day | ORAL | Status: DC
  Administered 2016-05-20: 10 mg via ORAL
  Filled 2016-05-20 (×2): qty 1

## 2016-05-20 MED ORDER — CLONIDINE HCL 0.3 MG/24HR TD PTWK
0.3000 mg | MEDICATED_PATCH | TRANSDERMAL | Status: DC
  Filled 2016-05-20: qty 1

## 2016-05-20 MED ORDER — GABAPENTIN 100 MG PO CAPS
100.0000 mg | ORAL_CAPSULE | Freq: Three times a day (TID) | ORAL | Status: DC
  Administered 2016-05-20: 100 mg via ORAL
  Filled 2016-05-20: qty 1

## 2016-05-20 MED ORDER — SEVELAMER CARBONATE 800 MG PO TABS: 800.0000 mg | ORAL_TABLET | Freq: Two times a day (BID) | ORAL | Status: DC | PRN

## 2016-05-20 MED ORDER — PENTAFLUOROPROP-TETRAFLUOROETH EX AERO: 1.0000 "application " | INHALATION_SPRAY | CUTANEOUS | Status: DC | PRN

## 2016-05-20 MED ORDER — LIDOCAINE HCL (PF) 1 % IJ SOLN: 5.0000 mL | INTRAMUSCULAR | Status: DC | PRN

## 2016-05-20 MED ORDER — GABAPENTIN 100 MG PO CAPS: 100.0000 mg | ORAL_CAPSULE | Freq: Every day | ORAL | Status: DC

## 2016-05-20 MED ORDER — HEPARIN SODIUM (PORCINE) 1000 UNIT/ML DIALYSIS: 20.0000 [IU]/kg | INTRAMUSCULAR | Status: DC | PRN

## 2016-05-20 MED ORDER — ISOSORBIDE MONONITRATE ER 60 MG PO TB24
60.0000 mg | ORAL_TABLET | Freq: Every day | ORAL | Status: DC
  Administered 2016-05-20: 60 mg via ORAL
  Filled 2016-05-20 (×2): qty 1

## 2016-05-20 MED ORDER — PREDNISONE 10 MG PO TABS
10.0000 mg | ORAL_TABLET | Freq: Every day | ORAL | Status: DC
  Administered 2016-05-20 – 2016-05-21 (×2): 10 mg via ORAL
  Filled 2016-05-20 (×3): qty 1

## 2016-05-20 MED ORDER — ALLOPURINOL 100 MG PO TABS
100.0000 mg | ORAL_TABLET | Freq: Every day | ORAL | Status: DC
  Administered 2016-05-20: 100 mg via ORAL
  Filled 2016-05-20 (×2): qty 1

## 2016-05-20 MED ORDER — ACETAMINOPHEN 325 MG PO TABS: 650.0000 mg | ORAL_TABLET | Freq: Four times a day (QID) | ORAL | Status: DC | PRN

## 2016-05-20 MED ORDER — ACETAMINOPHEN 650 MG RE SUPP: 650.0000 mg | Freq: Four times a day (QID) | RECTAL | Status: DC | PRN

## 2016-05-20 MED ORDER — CARVEDILOL 25 MG PO TABS
25.0000 mg | ORAL_TABLET | Freq: Two times a day (BID) | ORAL | Status: DC
  Administered 2016-05-20 – 2016-05-21 (×3): 25 mg via ORAL
  Filled 2016-05-20 (×3): qty 1

## 2016-05-20 MED ORDER — DOXAZOSIN MESYLATE 2 MG PO TABS
2.0000 mg | ORAL_TABLET | Freq: Every day | ORAL | Status: DC
  Administered 2016-05-20: 2 mg via ORAL
  Filled 2016-05-20 (×2): qty 1

## 2016-05-20 MED ORDER — ONDANSETRON HCL 4 MG/2ML IJ SOLN
4.0000 mg | Freq: Four times a day (QID) | INTRAMUSCULAR | Status: DC | PRN
  Administered 2016-05-20: 4 mg via INTRAVENOUS

## 2016-05-20 NOTE — Progress Notes (Signed)
ANTICOAGULATION CONSULT NOTE - Initial Consult  Pharmacy Consult for Warfarin  Indication: Hx PE/DVT  Allergies  Allergen Reactions  . Butalbital-Apap-Caffeine Shortness Of Breath and Swelling    Swelling in throat  . Ferrlecit [Na Ferric Gluc Cplx In Sucrose] Shortness Of Breath, Swelling and Other (See Comments)    Swelling in throat  . Minoxidil Shortness Of Breath  . Darvocet [Propoxyphene N-Acetaminophen] Hives  . Other Hives    Allergy listed on file from [place] (Pt did not mention any allergies) Pt able to do norco (tylenol)    Patient Measurements: Height: _0  (188 cm) Weight: 164 lb 10.9 oz (74.7 kg) IBW/kg (Calculated) : 82.2  Vital Signs: Temp: 98.4 F (36.9 C) (10/30 0143) Temp Source: Oral (10/30 0143) BP: 161/89 (10/30 0143) Pulse Rate: 71 (10/30 0143)  Labs:  Recent Labs  05/17/16 2358 05/19/16 2035  HGB 8.6* 8.5*  HCT 27.2* 27.0*  PLT 183 182  CREATININE 9.64* 14.74*    Estimated Creatinine Clearance: 6.2 mL/min (by C-G formula based on SCr of 14.74 mg/dL (H)).   Medical History: Past Medical History:  Diagnosis Date  . Anemia   . Anxiety   . Chronic combined systolic and diastolic CHF (congestive heart failure) (HCC)    a. EF 20-25% by echo in 08/2015 b. echo 10/2015: EF 35-40%, diffuse HK, severe LAE, moderate RAE, small pericardial effusion  . Complication of anesthesia    itching, sore throat  . Depression   . Dialysis patient ([place])   . ESRD (end stage renal disease) ([place])    due to HTN per patient, followed at [facility], s/p failed kidney transplant - dialysis Tue, Th, Sat  . Hyperkalemia 12/2015  . Hypertension   . Junctional rhythm    a. noted in 08/2015: hyperkalemic at that time  b. 12/2015: presented in junctional rhythm w/ K+ of 6.6. Resolved with improvement of K+ levels.  . Nonischemic cardiomyopathy ([place])    a. 08/2014: cath showing minimal CAD, but tortuous arteries noted.   . Renal insufficiency   . Shortness of  breath   . Type II diabetes mellitus (HCC)    No history per patient, but remains under history as A1c would not be accurate given on dialysis    Assessment: 50 y/o M here with vomiting, warfarin PTA for hx DVT/PE, on INR yet   Goal of Therapy:  INR 2-3 Monitor platelets by anticoagulation protocol: Yes   Plan:  -Await INR with AM labs to assess dosing needs  Narda Bonds 05/20/2016,2:16 AM

## 2016-05-20 NOTE — ED Notes (Signed)
Resident at bedside.  

## 2016-05-20 NOTE — Consult Note (Addendum)
Reason for Consult: Continuity of ESRD care, hyperkalemia Referring Physician: Eulogio Bear D.O. [facility])   HPI:  50 year old African-American man with past medical history significant for ESRD secondary to hypertension, failed renal transplant and history of RCC awaiting nephrectomy who is on dialysis on a TTS schedule at [facility]. He also has history of DVT/PE on chronic Coumadin, history of CHF/nonischemic cardiomyopathy, type 2 diabetes mellitus and extensive history of poor adherence to dialysis/medications. Admitted yesterday after he presented to the emergency room requesting for dialysis after 1 week history of progressive nausea, malaise and vomiting. He states that he last went to hemodialysis to his unit on Thursday but could not go on Saturday because he was feeling sick. He was admitted for hyperkalemia of 5.7 without EKG changes.  His story regarding transfer to a local dialysis unit keeps changing with every encounter and I cannot corroborate that he is being transferred from anyone.  Past Medical History:  Diagnosis Date  . Anemia   . Anxiety   . Chronic combined systolic and diastolic CHF (congestive heart failure) (HCC)    a. EF 20-25% by echo in 08/2015 b. echo 10/2015: EF 35-40%, diffuse HK, severe LAE, moderate RAE, small pericardial effusion  . Complication of anesthesia    itching, sore throat  . Depression   . Dialysis patient ([place])   . ESRD (end stage renal disease) ([place])    due to HTN per patient, followed at [facility], s/p failed kidney transplant - dialysis Tue, Th, Sat  . Hyperkalemia 12/2015  . Hypertension   . Junctional rhythm    a. noted in 08/2015: hyperkalemic at that time  b. 12/2015: presented in junctional rhythm w/ K+ of 6.6. Resolved with improvement of K+ levels.  . Nonischemic cardiomyopathy ([place])    a. 08/2014: cath showing minimal CAD, but tortuous arteries noted.   . Renal insufficiency   . Shortness of breath   . Type II  diabetes mellitus (HCC)    No history per patient, but remains under history as A1c would not be accurate given on dialysis    Past Surgical History:  Procedure Laterality Date  . CAPD INSERTION    . CAPD REMOVAL    . INGUINAL HERNIA REPAIR Right 02/14/2015   Procedure: REPAIR INCARCERATED RIGHT INGUINAL HERNIA;  Surgeon: [name], MD;  Location: [place];  Service: General;  Laterality: Right;  . INSERTION OF DIALYSIS CATHETER Right 09/23/2015   Procedure: exchange of Right internal Dialysis Catheter.;  Surgeon: [name], MD;  Location: [place];  Service: Vascular;  Laterality: Right;  . KIDNEY RECEIPIENT  2006   failed and started HD in March 2014  . LEFT HEART CATHETERIZATION WITH CORONARY ANGIOGRAM N/A 09/02/2014   Procedure: LEFT HEART CATHETERIZATION WITH CORONARY ANGIOGRAM;  Surgeon: [name], MD;  Location: [facility];  Service: Cardiovascular;  Laterality: N/A;    Family History  Problem Relation Age of Onset  . Hypertension Other     Social History:  reports that he has quit smoking. His smoking use included Cigarettes. He smoked 0.00 packs per day for 1.00 year. He has never used smokeless tobacco. He reports that he does not drink alcohol or use drugs.  Allergies:  Allergies  Allergen Reactions  . Butalbital-Apap-Caffeine Shortness Of Breath and Swelling    Swelling in throat  . Ferrlecit [Na Ferric Gluc Cplx In Sucrose] Shortness Of Breath, Swelling and Other (See Comments)    Swelling in throat  .  Minoxidil Shortness Of Breath  . Darvocet [Propoxyphene N-Acetaminophen] Hives  . Other Hives    Allergy listed on file from [place] (Pt did not mention any allergies) Pt able to do norco (tylenol)    Medications:  Scheduled: . allopurinol  100 mg Oral Daily  . amLODipine  10 mg Oral Daily  . atorvastatin  40 mg Oral q1800  . carvedilol  25 mg Oral BID WC  . cinacalcet  30 mg Oral Q breakfast  . cloNIDine  0.3 mg Transdermal Weekly  . doxazosin  2 mg  Oral Daily  . Ferric Citrate  420 mg Oral TID  . gabapentin  100 mg Oral TID  . hydrALAZINE  100 mg Oral Q8H  . isosorbide mononitrate  60 mg Oral Daily  . pantoprazole  40 mg Oral Daily  . predniSONE  10 mg Oral Q breakfast  . sevelamer carbonate  1,600 mg Oral TID WC  . sodium chloride flush  3 mL Intravenous Q12H    BMP Latest Ref Rng & Units 05/20/2016 05/19/2016 05/17/2016  Glucose 65 - 99 mg/dL 78 142(H) 114(H)  BUN 6 - 20 mg/dL 65(H) 59(H) 28(H)  Creatinine 0.61 - 1.24 mg/dL 15.80(H) 14.74(H) 9.64(H)  Sodium 135 - 145 mmol/L 137 136 135  Potassium 3.5 - 5.1 mmol/L 6.0(H) 5.7(H) 5.0  Chloride 101 - 111 mmol/L 100(L) 98(L) 99(L)  CO2 22 - 32 mmol/L _0 Calcium 8.9 - 10.3 mg/dL 7.7(L) 8.2(L) 8.4(L)   CBC Latest Ref Rng & Units 05/19/2016 05/17/2016 05/16/2016  WBC 4.0 - 10.5 K/uL 5.5 5.2 5.6  Hemoglobin 13.0 - 17.0 g/dL 8.5(L) 8.6(L) 8.4(L)  Hematocrit 39.0 - 52.0 % 27.0(L) 27.2(L) 25.4(L)  Platelets 150 - 400 K/uL 182 183 202    Review of Systems  Constitutional: Positive for malaise/fatigue. Negative for chills and fever.  HENT: Negative for congestion, nosebleeds and sore throat.   Eyes: Negative.   Respiratory: Negative.   Cardiovascular: Negative.   Gastrointestinal: Positive for nausea and vomiting. Negative for blood in stool, diarrhea and heartburn.  Genitourinary: Negative.   Musculoskeletal: Positive for back pain and myalgias.  Skin: Negative.   Neurological: Positive for headaches. Negative for dizziness and tingling.   Blood pressure (!) 161/89, pulse 71, temperature 98.4 F (36.9 C), temperature source Oral, resp. rate (!) 9, height _1  (1.88 m), weight 74.7 kg (164 lb 10.9 oz), SpO2 99 %. Physical Exam  Nursing note and vitals reviewed. Constitutional: He is oriented to person, place, and time. He appears well-developed and well-nourished. No distress.  Appears chronically ill  HENT:  Head: Normocephalic and atraumatic.  Nose: Nose normal.   Eyes: EOM are normal. Pupils are equal, round, and reactive to light. No scleral icterus.  Neck: Normal range of motion. Neck supple. No JVD present. No tracheal deviation present.  Cardiovascular: Normal rate and normal heart sounds.  Exam reveals no friction rub.   Respiratory: Effort normal and breath sounds normal. He has no wheezes. He has no rales.  GI: Soft. Bowel sounds are normal. There is no tenderness. There is no rebound.  Musculoskeletal: Normal range of motion. He exhibits no edema.  Left forearm AVF with good bruit, mildly pulsatile thrill  Neurological: He is alert and oriented to person, place, and time.  Skin: Skin is warm and dry. No erythema.    Assessment/Plan: 1. Hyperkalemia: Because of his poor adherence to planned dialysis treatments at his outpatient dialysis unit. Will undertake hemodialysis today. 2. End-stage  renal disease: Hemodialysis today with possible discharge thereafter to resume hemodialysis at his outpatient dialysis unit tomorrow otherwise, will do dialysis again here tomorrow. 3. Anemia: Secondary to anemia of ESRD/chronic disease with poor compliance with dialysis treatments and therefore unable to get scheduled doses of ESA. 4. Metabolic bone disease: Previously checked phosphorus level within acceptable limit, discussed low phosphorus diet/compliance with binders. 5. Hypertension: Reiterated compliance with antihypertensive therapy and will attempt ultrafiltration at hemodialysis.  Aster Eckrich K. 05/20/2016, 10:09 AM

## 2016-05-20 NOTE — Progress Notes (Signed)
Patient admitted after midnight, please see H&P.  Needs dialysis-- nephrology consult.  Does not have permanent place to get dialysis- social work consult  Eulogio Bear DO

## 2016-05-20 NOTE — Progress Notes (Addendum)
[place] for Warfarin  Indication: Hx PE/DVT  Allergies  Allergen Reactions  . Butalbital-Apap-Caffeine Shortness Of Breath and Swelling    Swelling in throat  . Ferrlecit [Na Ferric Gluc Cplx In Sucrose] Shortness Of Breath, Swelling and Other (See Comments)    Swelling in throat  . Minoxidil Shortness Of Breath  . Darvocet [Propoxyphene N-Acetaminophen] Hives  . Other Hives    Allergy listed on file from [place] (Pt did not mention any allergies) Pt able to do norco (tylenol)    Patient Measurements: Height: 6' 2" (188 cm) Weight: 164 lb 10.9 oz (74.7 kg) IBW/kg (Calculated) : 82.2  Vital Signs: Temp: 98.4 F (36.9 C) (10/30 0143) Temp Source: Oral (10/30 0143) BP: 161/89 (10/30 0143) Pulse Rate: 71 (10/30 0143)  Labs:  Recent Labs  05/17/16 2358 05/19/16 2035 05/20/16 0608  HGB 8.6* 8.5*  --   HCT 27.2* 27.0*  --   PLT 183 182  --   LABPROT  --   --  14.9  INR  --   --  1.17  CREATININE 9.64* 14.74* 15.80*    Estimated Creatinine Clearance: 5.8 mL/min (by C-G formula based on SCr of 15.8 mg/dL (H)).   Assessment: 50 y/o M here with vomiting and HD. On w arfarin PTA for hx DVT/PE. Noted history of noncompliance with medications and dialysis.  INR on admission is 1.17- suspect noncompliance with warfarin therapy (patient told medication history technician he is not taking any medications PTA). Home dose should be 25m daily.  Hgb 8.5, plts 182- no bleeding noted.  Note renal's plans for HD today and possible discharge after.  Goal of Therapy:  INR 2-3 Monitor platelets by anticoagulation protocol: Yes   Plan:  -warfarin 72mpo x1 tonight  -daily INR while here -follow for s/s bleeding  Sarena Jezek D. Jessiah Steinhart, PharmD, BCPS Clinical Pharmacist Pager: [phone]/30/2017 11:24 AM

## 2016-05-20 NOTE — Progress Notes (Signed)
Pt refusing care, medications, head to toe assessment, and vitals. He stated that he just wanted to be left alone to sleep. Will inform on call MD. Will continue to monitor.   Eleanora Neighbor, RN

## 2016-05-20 NOTE — H&P (Signed)
History and Physical  Patient Name: Frank [name]     QQV:[id number]    DOB: August 27, 1965    DOA: 05/19/2016 PCP: Angelica Chessman, MD   Patient coming from: Home  Chief Complaint: Nausea, "I need dialysis"  HPI: BRAESON RUPE is a 50 y.o. male with a past medical history significant for ESRD on HD without HD center, failed renal transplant, chronic pain, recurrent DVT on warfarin, isch CM/CHF EF 35-40%, history of RCC scheduled for nephrectomy, and HTN who presents with needing dialysis and is found to have K 5.7.  His last dialysis was out of the ER at [facility] on Friday.  Since then, he has had progressive nausea, malaise, vomiting of NBNB emesis a few times per day.  He came today because he thought it was time for dialysis.  ED course: -Afebrile, heart rate 80s, respirations normal, pulse ox 100% on room air, BP 175/107 -Na 136, K 5.7, Cr 14.74, WBC 5.5K, Hgb 8.5 -ECG showed sinus rhythm with no peaked T waves or QRS widening and some chronic TWI and new lateral TWI; denied chest pain, dyspnea    The patient was last admitted here on Monday of last week for K 6.9. Received urgent dialysis overnight, felt vague malaise the next day and so was observed a second night.  He then went to [facility] ER on 10/25 and then 10/27, the second time receiving dialysis by the fellow and discharged from the ER.  He reports that his last HD center was [facility] in [place].  He claims that he has been seen by [facility] and that he is in touch with [facility] in order to find a new HD center here in [place] but that "no one will take me until my fistula is mature next week".        ROS: Review of Systems  Constitutional: Positive for malaise/fatigue.  Gastrointestinal: Positive for nausea and vomiting.  Musculoskeletal: Positive for back pain.  All other systems reviewed and are negative.         Past Medical History:  Diagnosis Date  . Anemia   . Anxiety   . Chronic combined systolic and diastolic CHF  (congestive heart failure) (HCC)    a. EF 20-25% by echo in 08/2015 b. echo 10/2015: EF 35-40%, diffuse HK, severe LAE, moderate RAE, small pericardial effusion  . Complication of anesthesia    itching, sore throat  . Depression   . Dialysis patient ([place])   . ESRD (end stage renal disease) ([place])    due to HTN per patient, followed at [facility], s/p failed kidney transplant - dialysis Tue, Th, Sat  . Hyperkalemia 12/2015  . Hypertension   . Junctional rhythm    a. noted in 08/2015: hyperkalemic at that time  b. 12/2015: presented in junctional rhythm w/ K+ of 6.6. Resolved with improvement of K+ levels.  . Nonischemic cardiomyopathy ([place])    a. 08/2014: cath showing minimal CAD, but tortuous arteries noted.   . Renal insufficiency   . Shortness of breath   . Type II diabetes mellitus (HCC)    No history per patient, but remains under history as A1c would not be accurate given on dialysis    Past Surgical History:  Procedure Laterality Date  . CAPD INSERTION    . CAPD REMOVAL    . INGUINAL HERNIA REPAIR Right 02/14/2015   Procedure: REPAIR INCARCERATED RIGHT INGUINAL HERNIA;  Surgeon: [name], MD;  Location: [facility];  Service: General;  Laterality: Right;  .  INSERTION OF DIALYSIS CATHETER Right 09/23/2015   Procedure: exchange of Right internal Dialysis Catheter.;  Surgeon: [name], MD;  Location: [facility];  Service: Vascular;  Laterality: Right;  . KIDNEY RECEIPIENT  2006   failed and started HD in March 2014  . LEFT HEART CATHETERIZATION WITH CORONARY ANGIOGRAM N/A 09/02/2014   Procedure: LEFT HEART CATHETERIZATION WITH CORONARY ANGIOGRAM;  Surgeon: [name], MD;  Location: [facility] CATH LAB;  Service: Cardiovascular;  Laterality: N/A;    Social History: Patient lives alone  The patient walks unassisted.  He is a former Development worker, community.  He is a former smoker.      Allergies  Allergen Reactions  . Butalbital-Apap-Caffeine Shortness Of Breath and Swelling    Swelling in throat  .  Ferrlecit [Na Ferric Gluc Cplx In Sucrose] Shortness Of Breath, Swelling and Other (See Comments)    Swelling in throat  . Minoxidil Shortness Of Breath  . Darvocet [Propoxyphene N-Acetaminophen] Hives  . Other Hives    Allergy listed on file from [place] (Pt did not mention any allergies) Pt able to do norco (tylenol)    Family history: family history includes Hypertension in his other.  Prior to Admission medications   Medication Sig Start Date End Date Taking? Authorizing Provider  allopurinol (ZYLOPRIM) 100 MG tablet Take 100 mg by mouth daily.    Historical Provider, MD  amLODipine (NORVASC) 10 MG tablet Take 10 mg by mouth daily.    Historical Provider, MD  atorvastatin (LIPITOR) 40 MG tablet Take 1 tablet (40 mg total) by mouth daily at 6 PM. 09/02/14   Barton Dubois, MD  AURYXIA 1 GM 210 MG(Fe) TABS Take 420 mg by mouth 3 (three) times daily. 01/26/16   Historical Provider, MD  carvedilol (COREG) 25 MG tablet Take 25 mg by mouth 2 (two) times daily with a meal.    Historical Provider, MD  cinacalcet (SENSIPAR) 30 MG tablet Take 30 mg by mouth daily.    Historical Provider, MD  cloNIDine (CATAPRES - DOSED IN MG/24 HR) 0.3 mg/24hr patch Place 0.3 mg onto the skin once a week.    Historical Provider, MD  doxazosin (CARDURA) 2 MG tablet Take 2 mg by mouth daily.    Historical Provider, MD  famotidine (PEPCID) 20 MG tablet Take 1 tablet (20 mg total) by mouth 2 (two) times daily. 02/13/16   Charlesetta Shanks, MD  gabapentin (NEURONTIN) 100 MG capsule Take 100 mg by mouth 3 (three) times daily.    Historical Provider, MD  hydrALAZINE (APRESOLINE) 100 MG tablet Take 100 mg by mouth every 8 (eight) hours.    Historical Provider, MD  isosorbide mononitrate (IMDUR) 60 MG 24 hr tablet Take 1 tablet (60 mg total) by mouth daily. 01/09/16   Smiley Houseman, MD  omeprazole (PRILOSEC) 20 MG capsule Take 20 mg by mouth daily. 07/01/13   Historical Provider, MD  ondansetron (ZOFRAN ODT) 4 MG  disintegrating tablet Take 1 tablet (4 mg total) by mouth every 8 (eight) hours as needed for nausea or vomiting. 05/16/16   Sherwood Gambler, MD  oxycodone (OXY-IR) 5 MG capsule Take 5 mg by mouth every 4 (four) hours as needed.    Historical Provider, MD  predniSONE (DELTASONE) 10 MG tablet Take 10 mg by mouth daily with breakfast.    Historical Provider, MD  sevelamer carbonate (RENVELA) 800 MG tablet Take 800-1,600 mg by mouth 3 (three) times daily with meals. 2 tabs three times daily with meals, and 1  tablet with snacks    Historical Provider, MD  warfarin (COUMADIN) 2 MG tablet Take 2 mg by mouth daily. Take with 78m tablet to equal 7796m7/12/17   Historical Provider, MD  warfarin (COUMADIN) 5 MG tablet Take 5 mg by mouth daily. Take with 96m96mablet to equal 7mg55m12/17   Historical Provider, MD       Physical Exam: BP (!) 146/104   Pulse 69   Temp 97.9 F (36.6 C) (Oral)   Resp 12   Ht _0  (1.88 m)   Wt 72.6 kg (160 lb)   SpO2 96%   BMI 20.54 kg/m  General appearance: Well-developed, adult male, alert and in no acute distress, sitting up in bed, appears tired, uncomfortable.   Eyes: Anicteric, conjunctiva pink, lids and lashes normal. PERRL.    ENT: No nasal deformity, discharge, epistaxis.  Hearing normal. OP moist without lesions.   Skin: Warm and dry.  Severe xerosis.   Cardiac: RRR, nl S1-S2, no murmurs appreciated.  Capillary refill is brisk.  No JVD.  No LE edema.   Respiratory: Normal respiratory rate and rhythm.  CTAB without rales or wheezes. Abdomen: Abdomen soft.  No TTP. No ascites, distension, hepatosplenomegaly.   MSK: No deformities or effusions.  No cyanosis or clubbing.   Neuro: Cranial nerves normal.  Sensation intact to light touch. Speech is fluent.  Muscle strength normal.    Psych: Sensorium intact and responding to questions, attention normal.  Behavior appropriate.  Affect normal.  Judgment and insight appear normal.    Labs on Admission:  I have  personally reviewed following labs and imaging studies: CBC:  Recent Labs Lab 05/14/16 1820 05/15/16 0558 05/16/16 2049 05/17/16 2358 05/19/16 2035  WBC 6.5 6.9 5.6 5.2 5.5  NEUTROABS  --   --   --  3.6  --   HGB 8.1* 7.9* 8.4* 8.6* 8.5*  HCT 25.0* 25.0* 25.4* 27.2* 27.0*  MCV 86.5 86.5 87.0 86.6 87.4  PLT 166 179 202 183 182 409asic Metabolic Panel:  Recent Labs Lab 05/14/16 1820 05/15/16 0558 05/16/16 2049 05/17/16 2358 05/19/16 2035  NA 134* 135 136 135 136  K 5.9* 4.7 5.6* 5.0 5.7*  CL 99* 98* 97* 99* 98*  CO2 _1 GLUCOSE 106* 82 144* 114* 142*  BUN 35* 43* 60* 28* 59*  CREATININE 10.96* 12.07* 15.16* 9.64* 14.74*  CALCIUM 8.3* 8.2* 7.6* 8.4* 8.2*  PHOS  --  4.7*  --   --   --    GFR: Estimated Creatinine Clearance: 6 mL/min (by C-G formula based on SCr of 14.74 mg/dL (H)).  Liver Function Tests:  Recent Labs Lab 05/13/16 1800 05/15/16 0558 05/17/16 2358 05/19/16 2035  AST 36  --  34 33  ALT 13*  --  14* 13*  ALKPHOS 135*  --  138* 120  BILITOT 0.5  --  0.5 0.7  PROT 7.8  --  7.9 7.4  ALBUMIN 3.5 3.0* 3.3* 3.2*    Recent Labs Lab 05/13/16 1800 05/19/16 2035  LIPASE 54* 31   No results for input(s): AMMONIA in the last 168 hours. Coagulation Profile:  Recent Labs Lab 05/14/16 0502 05/15/16 0558 05/16/16 2203  INR 1.19 1.13 1.17   Cardiac Enzymes: No results for input(s): CKTOTAL, CKMB, CKMBINDEX, TROPONINI in the last 168 hours. BNP (last 3 results) No results for input(s): PROBNP in the last 8760 hours. HbA1C: No results for input(s): HGBA1C in the last 72  hours. CBG:  Recent Labs Lab 05/14/16 0826 05/14/16 1105 05/14/16 1345 05/14/16 2157  GLUCAP 80 103* 98 164*   Lipid Profile: No results for input(s): CHOL, HDL, LDLCALC, TRIG, CHOLHDL, LDLDIRECT in the last 72 hours. Thyroid Function Tests: No results for input(s): TSH, T4TOTAL, FREET4, T3FREE, THYROIDAB in the last 72 hours. Anemia Panel: No results for  input(s): VITAMINB12, FOLATE, FERRITIN, TIBC, IRON, RETICCTPCT in the last 72 hours. Sepsis Labs: Invalid input(s): PROCALCITONIN, LACTICIDVEN Recent Results (from the past 240 hour(s))  MRSA PCR Screening     Status: None   Collection Time: 05/15/16  7:38 AM  Result Value Ref Range Status   MRSA by PCR NEGATIVE NEGATIVE Final    Comment:        The GeneXpert MRSA Assay (FDA approved for NASAL specimens only), is one component of a comprehensive MRSA colonization surveillance program. It is not intended to diagnose MRSA infection nor to guide or monitor treatment for MRSA infections.           EKG: Independently reviewed. Rate 82, QRS 463.  TWI in lateral leads are old, TWI in inferior leads are new.    Assessment/Plan  1. Hyperkalemia:    From missed HD.  Kayexalate given in ER. -Check BMP in AM -Consult to Nephrology for HD     2. ESRD:  Has been fired from HD centers in [place] and [place] SW staff and risk mgmt have been unable to find the patient a new HD center.  He claims to me tonight that he is working with his old HD center, [facility] in [place], to establish with a new HD center, but that there is a delay "until his graft matures" -Consult to SW to coordinate HD center placement -Consult to Nephrology, to evaluate for fistula maturity, and if needed, consult to Vascular surgery to evaluate for fistula maturity -Continue Renvela and cinacalcet  3. HTN:  -Continue Imdur, doxazosin, hydralazine, carvedilol, clonidine, and amlodipine  4. CHF:  EF 35-40%.  Euvolemic -Continue BB  5. Chronic pain:  Patient recently fired by his Urologist/opioid prescriber because of stating he was getting dialysis regularly when he was not and also obtaining prescriptions for opioids from ER providers without telling his Urologist.  In review of the state controlled substances registry, he has gotten more than one prescription for 4-6 tablets of opioid this month, from  what appear to be ER providers only.  His previous prescriber/Urologist was prescribing for chronic abdominal pain which has no clear source per Urology notes. -Avoid opioid narcotics  6. Anemia of renal disease:  Stable -Continue iron -ESA per nephrology  7. History of DVT:  -Continue warfarin, dosed per pharmacy  8. Failed renal transplant: -Continue prednisone 10 mg daily  9. Other medications: -Continue PPI and H2RA -Continue allopurinol -Continue gabapentin  10. Renal mass: He is followed by [facility] Urology Dr. Felicie Morn for a renal mass "suspicious for malignancy", and needs nephrectomy or biopsy when he is established with a dialysis center.   -Follow up with Dr. Felicie Morn in November         DVT prophylaxis: Heparin  Code Status: FULL  Family Communication: None present  Disposition Plan: Anticipate OBS overnight then eval tomorrow for HD by nephrology.  Also, eval maturity of fistula.  Also, SW consult to establish with new HD center.  Discharge tomorrow per Nephrology. Consults called: Nephrology Admission status: OBS, tele At the point of initial evaluation, it is my clinical opinion that admission for  OBSERVATION is reasonable and necessary because the patient's presenting complaints in the context of their chronic conditions represent sufficient risk of deterioration or significant morbidity to constitute reasonable grounds for close observation in the hospital setting, but that the patient may be medically stable for discharge from the hospital within 24 to 48 hours.    Medical decision making: Patient seen at 12:19 AM on 05/20/2016.  The patient was discussed with Dr. Wynetta Emery.  What exists of the patient's chart was reviewed in depth and outside records were reviewed in depth from [facility] and summarized above.  Clinical condition: stable.        [name] [facility] Pager [phone]

## 2016-05-21 DIAGNOSIS — D638 Anemia in other chronic diseases classified elsewhere: Secondary | ICD-10-CM

## 2016-05-21 LAB — PROTIME-INR
INR: 1.01
PROTHROMBIN TIME: 13.3 s (ref 11.4–15.2)

## 2016-05-21 MED ORDER — GABAPENTIN 100 MG PO CAPS: 100.0000 mg | ORAL_CAPSULE | Freq: Every day | ORAL | Status: DC

## 2016-05-21 MED ORDER — DARBEPOETIN ALFA 100 MCG/0.5ML IJ SOSY
PREFILLED_SYRINGE | INTRAMUSCULAR | Status: AC
  Filled 2016-05-21: qty 0.5

## 2016-05-21 MED ORDER — DARBEPOETIN ALFA 100 MCG/0.5ML IJ SOSY
100.0000 ug | PREFILLED_SYRINGE | Freq: Once | INTRAMUSCULAR | Status: AC
  Administered 2016-05-21: 100 ug via INTRAVENOUS

## 2016-05-21 NOTE — Progress Notes (Signed)
Patient ID: Frank [name], male   DOB: April 06, 1966, 50 y.o.   MRN: [id number]  [facility] Progress Note   Assessment/ Plan:   1. Hyperkalemia: Due to noncompliance with dialysis treatments-emergent hemodialysis done yesterday and elective hemodialysis on his usual scheduled to be done today. 2. End-stage renal disease: Hemodialysis today to resume his usual outpatient Tuesday/Thursday/Saturday schedule---discussed with him the importance of compliance with scheduled outpatient dialysis and using the proper channels in order to try and get transferred to a new unit. 3. Anemia: Secondary to anemia of ESRD/chronic disease with poor compliance with dialysis treatments -will give him Aranesp today at dialysis. 4. Metabolic bone disease: Previously checked phosphorus level within acceptable limit, discussed low phosphorus diet/compliance with binders. 5. Hypertension: Reiterated compliance with antihypertensive therapy and will attempt ultrafiltration at hemodialysis.  Subjective:   Reports to be feeling better this morning without any chest pain or shortness of breath. Asking to be dialyzed earlier in the day so that he can leave the hospital sooner. Refusal of care/assessment noted from prior notes.     Objective:   BP (!) 142/94 (BP Location: Right Arm)   Pulse 72   Temp 98.1 F (36.7 C) (Oral)   Resp 17   Ht _0  (1.88 m)   Wt 73 kg (160 lb 15 oz)   SpO2 97%   BMI 20.66 kg/m   Physical Exam: EWY:[id number] sitting up in recliner, watching television  JFT:NBZXY regular rhythm, S1 and S2 normal  Resp:Coarse breath sounds bilaterally, no distinct rales or rhonchi  DSW:VTVN, flat, nontender  RWC:HJSCB ankle edema, good thrill and bruit over left radiocephalic fistula  Labs: BMET  Recent Labs Lab 05/14/16 1820 05/15/16 0558 05/16/16 2049 05/17/16 2358 05/19/16 2035 05/20/16 0608  NA 134* 135 136 135 136 137  K 5.9* 4.7 5.6* 5.0 5.7* 6.0*  CL 99* 98* 97* 99* 98*  100*  CO2 _1 GLUCOSE 106* 82 144* 114* 142* 78  BUN 35* 43* 60* 28* 59* 65*  CREATININE 10.96* 12.07* 15.16* 9.64* 14.74* 15.80*  CALCIUM 8.3* 8.2* 7.6* 8.4* 8.2* 7.7*  PHOS  --  4.7*  --   --   --   --    CBC  Recent Labs Lab 05/15/16 0558 05/16/16 2049 05/17/16 2358 05/19/16 2035  WBC 6.9 5.6 5.2 5.5  NEUTROABS  --   --  3.6  --   HGB 7.9* 8.4* 8.6* 8.5*  HCT 25.0* 25.4* 27.2* 27.0*  MCV 86.5 87.0 86.6 87.4  PLT 179 202 183 182   Medications:    . allopurinol  100 mg Oral Daily  . amLODipine  10 mg Oral Daily  . atorvastatin  40 mg Oral q1800  . carvedilol  25 mg Oral BID WC  . cinacalcet  30 mg Oral Q breakfast  . cloNIDine  0.3 mg Transdermal Weekly  . doxazosin  2 mg Oral Daily  . Ferric Citrate  420 mg Oral TID  . gabapentin  100 mg Oral QHS  . hydrALAZINE  100 mg Oral Q8H  . isosorbide mononitrate  60 mg Oral Daily  . pantoprazole  40 mg Oral Daily  . predniSONE  10 mg Oral Q breakfast  . sevelamer carbonate  1,600 mg Oral TID WC  . sodium chloride flush  3 mL Intravenous Q12H  . Warfarin - Pharmacist Dosing Inpatient   Does not apply [id number]   Elmarie Shiley, MD 05/21/2016, 10:18 AM

## 2016-05-21 NOTE — Progress Notes (Signed)
Patient refused to leave until he was given a prescription for pain medications.  I explained again to patient that we do not provide chronic narcotic prescriptions and offered to provide him with [facility] pain policy.  Patient hung up phone. Frank Bear DO

## 2016-05-21 NOTE — Procedures (Signed)
Patient seen on Hemodialysis. QB 400, UF goal 1.5L Treatment adjusted as needed.  Elmarie Shiley MD [facility]. Office # [phone] Pager # [phone] 11:19 AM

## 2016-05-21 NOTE — Discharge Summary (Signed)
Physician Discharge Summary  Frank [name] NGX:[id number] DOB: [birth year] DOA: 05/19/2016  PCP: Angelica Chessman, MD  Admit date: 05/19/2016 Discharge date: 05/21/2016   Recommendations for Outpatient Follow-Up:   1. INR check on Thursday at regular appointment 2.  Continued counseling on compliance with meds and HD   Discharge Diagnosis:   Principal Problem:   Hyperkalemia Active Problems:   Essential hypertension, malignant   ESRD on dialysis- Tues, Thurs, Sat in [employer], nephro: [name]   Chronic combined systolic and diastolic CHF (congestive heart failure) (HCC)   Chronic pain syndrome   Anemia of chronic disease   Nonischemic cardiomyopathy [facility])   Discharge disposition:  Home  Discharge Condition: Improved.  Diet recommendation: renal  Wound care: None.   History of Present Illness:   Frank [name] a 50 y.o.malewith a past medical history significant for ESRD on HD without HD center, failed renal transplant, chronic pain, recurrent DVT on warfarin, isch CM/CHF EF 35-40%, history of RCC scheduled for nephrectomy, and HTNwho presents with needing dialysis and is found to have K 5.7.  His last dialysis was out of the ER at [facility] on Friday.  Since then, he has had progressive nausea, malaise, vomiting of NBNB emesis a few times per day.  He came today because he thought it was time for dialysis   Hospital Course by Problem:   Hyperkalemia:from missed hemodialysis, resolved post dialysis.   ESRD:discussed with him the importance of dialysis  To resume at outpatient facility  JGM:LVXBOZWR Imdur, doxazosin, hydralazine, carvedilol, clonidine, and amlodipine  History of KYB:TVDFPBHE warfarin-INR is subtherapeutic-claims he missed a few doses of Coumadin due to vomiting. He should resume his Coumadin and follow-up with his primary care practitioner. Compliance remains an issue.   Chronic pain: No narcotics- outpatient follow up      Medical Consultants:    renal   Discharge Exam:   Vitals:   05/20/16 1700 05/21/16 0906  BP: 110/73 (!) 142/94  Pulse: 63 72  Resp: 15 17  Temp:  98.1 F (36.7 C)   Vitals:   05/20/16 1530 05/20/16 1550 05/20/16 1700 05/21/16 0906  BP: 110/88 105/75 110/73 (!) 142/94  Pulse: 73 62 63 72  Resp: _0 Temp:  97.8 F (36.6 C) 98 F (36.7 C) 98.1 F (36.7 C)  TempSrc:  Oral Oral Oral  SpO2:  95% 98% 97%  Weight:  73 kg (160 lb 15 oz)    Height:        Gen:  NAD   The results of significant diagnostics from this hospitalization (including imaging, microbiology, ancillary and laboratory) are listed below for reference.     Procedures and Diagnostic Studies:   No results found.   Labs:   Basic Metabolic Panel:  Recent Labs Lab 05/15/16 0558 05/16/16 2049 05/17/16 2358 05/19/16 2035 05/20/16 0608  NA 135 136 135 136 137  K 4.7 5.6* 5.0 5.7* 6.0*  CL 98* 97* 99* 98* 100*  CO2 _1 GLUCOSE 82 144* 114* 142* 78  BUN 43* 60* 28* 59* 65*  CREATININE 12.07* 15.16* 9.64* 14.74* 15.80*  CALCIUM 8.2* 7.6* 8.4* 8.2* 7.7*  PHOS 4.7*  --   --   --   --    GFR Estimated Creatinine Clearance: 5.6 mL/min (by C-G formula based on SCr of 15.8 mg/dL (H)). Liver Function Tests:  Recent Labs Lab 05/15/16 0558 05/17/16 2358 05/19/16 2035  AST  --  34  33  ALT  --  14* 13*  ALKPHOS  --  138* 120  BILITOT  --  0.5 0.7  PROT  --  7.9 7.4  ALBUMIN 3.0* 3.3* 3.2*    Recent Labs Lab 05/19/16 2035  LIPASE 31   No results for input(s): AMMONIA in the last 168 hours. Coagulation profile  Recent Labs Lab 05/15/16 0558 05/16/16 2203 05/20/16 0608 05/21/16 0657  INR 1.13 1.17 1.17 1.01    CBC:  Recent Labs Lab 05/14/16 1820 05/15/16 0558 05/16/16 2049 05/17/16 2358 05/19/16 2035  WBC 6.5 6.9 5.6 5.2 5.5  NEUTROABS  --   --   --  3.6  --   HGB 8.1* 7.9* 8.4* 8.6* 8.5*  HCT 25.0* 25.0* 25.4* 27.2* 27.0*  MCV 86.5 86.5 87.0  86.6 87.4  PLT 166 179 202 183 182   Cardiac Enzymes: No results for input(s): CKTOTAL, CKMB, CKMBINDEX, TROPONINI in the last 168 hours. BNP: Invalid input(s): POCBNP CBG:  Recent Labs Lab 05/14/16 1105 05/14/16 1345 05/14/16 2157  GLUCAP 103* 98 164*   D-Dimer No results for input(s): DDIMER in the last 72 hours. Hgb A1c No results for input(s): HGBA1C in the last 72 hours. Lipid Profile No results for input(s): CHOL, HDL, LDLCALC, TRIG, CHOLHDL, LDLDIRECT in the last 72 hours. Thyroid function studies No results for input(s): TSH, T4TOTAL, T3FREE, THYROIDAB in the last 72 hours.  Invalid input(s): FREET3 Anemia work up No results for input(s): VITAMINB12, FOLATE, FERRITIN, TIBC, IRON, RETICCTPCT in the last 72 hours. Microbiology Recent Results (from the past 240 hour(s))  MRSA PCR Screening     Status: None   Collection Time: 05/15/16  7:38 AM  Result Value Ref Range Status   MRSA by PCR NEGATIVE NEGATIVE Final    Comment:        The GeneXpert MRSA Assay (FDA approved for NASAL specimens only), is one component of a comprehensive MRSA colonization surveillance program. It is not intended to diagnose MRSA infection nor to guide or monitor treatment for MRSA infections.      Discharge Instructions:   Discharge Instructions    Discharge instructions    Complete by:  As directed    INR check on Thursday Resume HD at outpatient facility Renal diet   Increase activity slowly    Complete by:  As directed        Medication List    TAKE these medications   allopurinol 100 MG tablet Commonly known as:  ZYLOPRIM Take 100 mg by mouth daily.   amLODipine 10 MG tablet Commonly known as:  NORVASC Take 10 mg by mouth daily.   atorvastatin 40 MG tablet Commonly known as:  LIPITOR Take 1 tablet (40 mg total) by mouth daily at 6 PM.   AURYXIA 1 GM 210 MG(Fe) Tabs Generic drug:  Ferric Citrate Take 420 mg by mouth 3 (three) times daily.   carvedilol 25  MG tablet Commonly known as:  COREG Take 25 mg by mouth 2 (two) times daily with a meal.   cinacalcet 30 MG tablet Commonly known as:  SENSIPAR Take 30 mg by mouth daily.   cloNIDine 0.3 mg/24hr patch Commonly known as:  CATAPRES - Dosed in mg/24 hr Place 0.3 mg onto the skin once a week.   doxazosin 2 MG tablet Commonly known as:  CARDURA Take 2 mg by mouth daily.   famotidine 20 MG tablet Commonly known as:  PEPCID Take 1 tablet (20 mg total) by mouth 2 (two)  times daily.   gabapentin 100 MG capsule Commonly known as:  NEURONTIN Take 1 capsule (100 mg total) by mouth at bedtime. What changed:  when to take this   hydrALAZINE 100 MG tablet Commonly known as:  APRESOLINE Take 100 mg by mouth every 8 (eight) hours.   isosorbide mononitrate 60 MG 24 hr tablet Commonly known as:  IMDUR Take 1 tablet (60 mg total) by mouth daily.   omeprazole 20 MG capsule Commonly known as:  PRILOSEC Take 20 mg by mouth daily.   ondansetron 4 MG disintegrating tablet Commonly known as:  ZOFRAN ODT Take 1 tablet (4 mg total) by mouth every 8 (eight) hours as needed for nausea or vomiting.   oxycodone 5 MG capsule Commonly known as:  OXY-IR Take 5 mg by mouth every 4 (four) hours as needed.   predniSONE 10 MG tablet Commonly known as:  DELTASONE Take 10 mg by mouth daily with breakfast.   sevelamer carbonate 800 MG tablet Commonly known as:  RENVELA Take 800-1,600 mg by mouth 3 (three) times daily with meals. 2 tabs three times daily with meals, and 1 tablet with snacks   warfarin 2 MG tablet Commonly known as:  COUMADIN Take 2 mg by mouth daily. Take with 68m tablet to equal 734m  warfarin 5 MG tablet Commonly known as:  COUMADIN Take 5 mg by mouth daily. Take with 72m18mablet to equal 7mg10m   Follow-up Information    JEGEAngelica Chessman .   Specialty:  Internal Medicine Why:  as scheduled for INR check (patient says Thursday) Contact information: [phone]        Time coordinating discharge: 35 min  Signed:  Lamonda Noxon U Jaxx Huish   [facility] 05/21/2016, 10:20 AM

## 2016-05-22 DIAGNOSIS — N186 End stage renal disease: Secondary | ICD-10-CM | POA: Diagnosis not present

## 2016-05-22 DIAGNOSIS — Z992 Dependence on renal dialysis: Secondary | ICD-10-CM | POA: Diagnosis not present

## 2016-05-22 DIAGNOSIS — I120 Hypertensive chronic kidney disease with stage 5 chronic kidney disease or end stage renal disease: Secondary | ICD-10-CM | POA: Diagnosis not present

## 2016-05-22 DIAGNOSIS — Z5321 Procedure and treatment not carried out due to patient leaving prior to being seen by health care provider: Secondary | ICD-10-CM | POA: Diagnosis not present

## 2016-05-23 DIAGNOSIS — Z7901 Long term (current) use of anticoagulants: Secondary | ICD-10-CM | POA: Diagnosis not present

## 2016-05-23 DIAGNOSIS — Z992 Dependence on renal dialysis: Secondary | ICD-10-CM | POA: Diagnosis not present

## 2016-05-23 DIAGNOSIS — Z79899 Other long term (current) drug therapy: Secondary | ICD-10-CM | POA: Diagnosis not present

## 2016-05-23 DIAGNOSIS — Z888 Allergy status to other drugs, medicaments and biological substances status: Secondary | ICD-10-CM | POA: Diagnosis not present

## 2016-05-23 DIAGNOSIS — I120 Hypertensive chronic kidney disease with stage 5 chronic kidney disease or end stage renal disease: Secondary | ICD-10-CM | POA: Diagnosis not present

## 2016-05-23 DIAGNOSIS — R9431 Abnormal electrocardiogram [ECG] [EKG]: Secondary | ICD-10-CM | POA: Diagnosis not present

## 2016-05-23 DIAGNOSIS — I517 Cardiomegaly: Secondary | ICD-10-CM | POA: Diagnosis not present

## 2016-05-23 DIAGNOSIS — I872 Venous insufficiency (chronic) (peripheral): Secondary | ICD-10-CM | POA: Diagnosis not present

## 2016-05-23 DIAGNOSIS — N186 End stage renal disease: Secondary | ICD-10-CM | POA: Diagnosis not present

## 2016-05-23 DIAGNOSIS — Z940 Kidney transplant status: Secondary | ICD-10-CM | POA: Diagnosis not present

## 2016-05-23 DIAGNOSIS — R079 Chest pain, unspecified: Secondary | ICD-10-CM | POA: Diagnosis not present

## 2016-05-23 IMAGING — CR DG CHEST 2V
2 series · 2 of 2 positions shown · non-contrast
Comparison: Chest radiograph performed 12/21/2013

CLINICAL DATA: Left-sided chest pain, radiating to the back.
Shortness of breath. History of smoking.

EXAM:
CHEST  2 VIEW

[w chest pa]
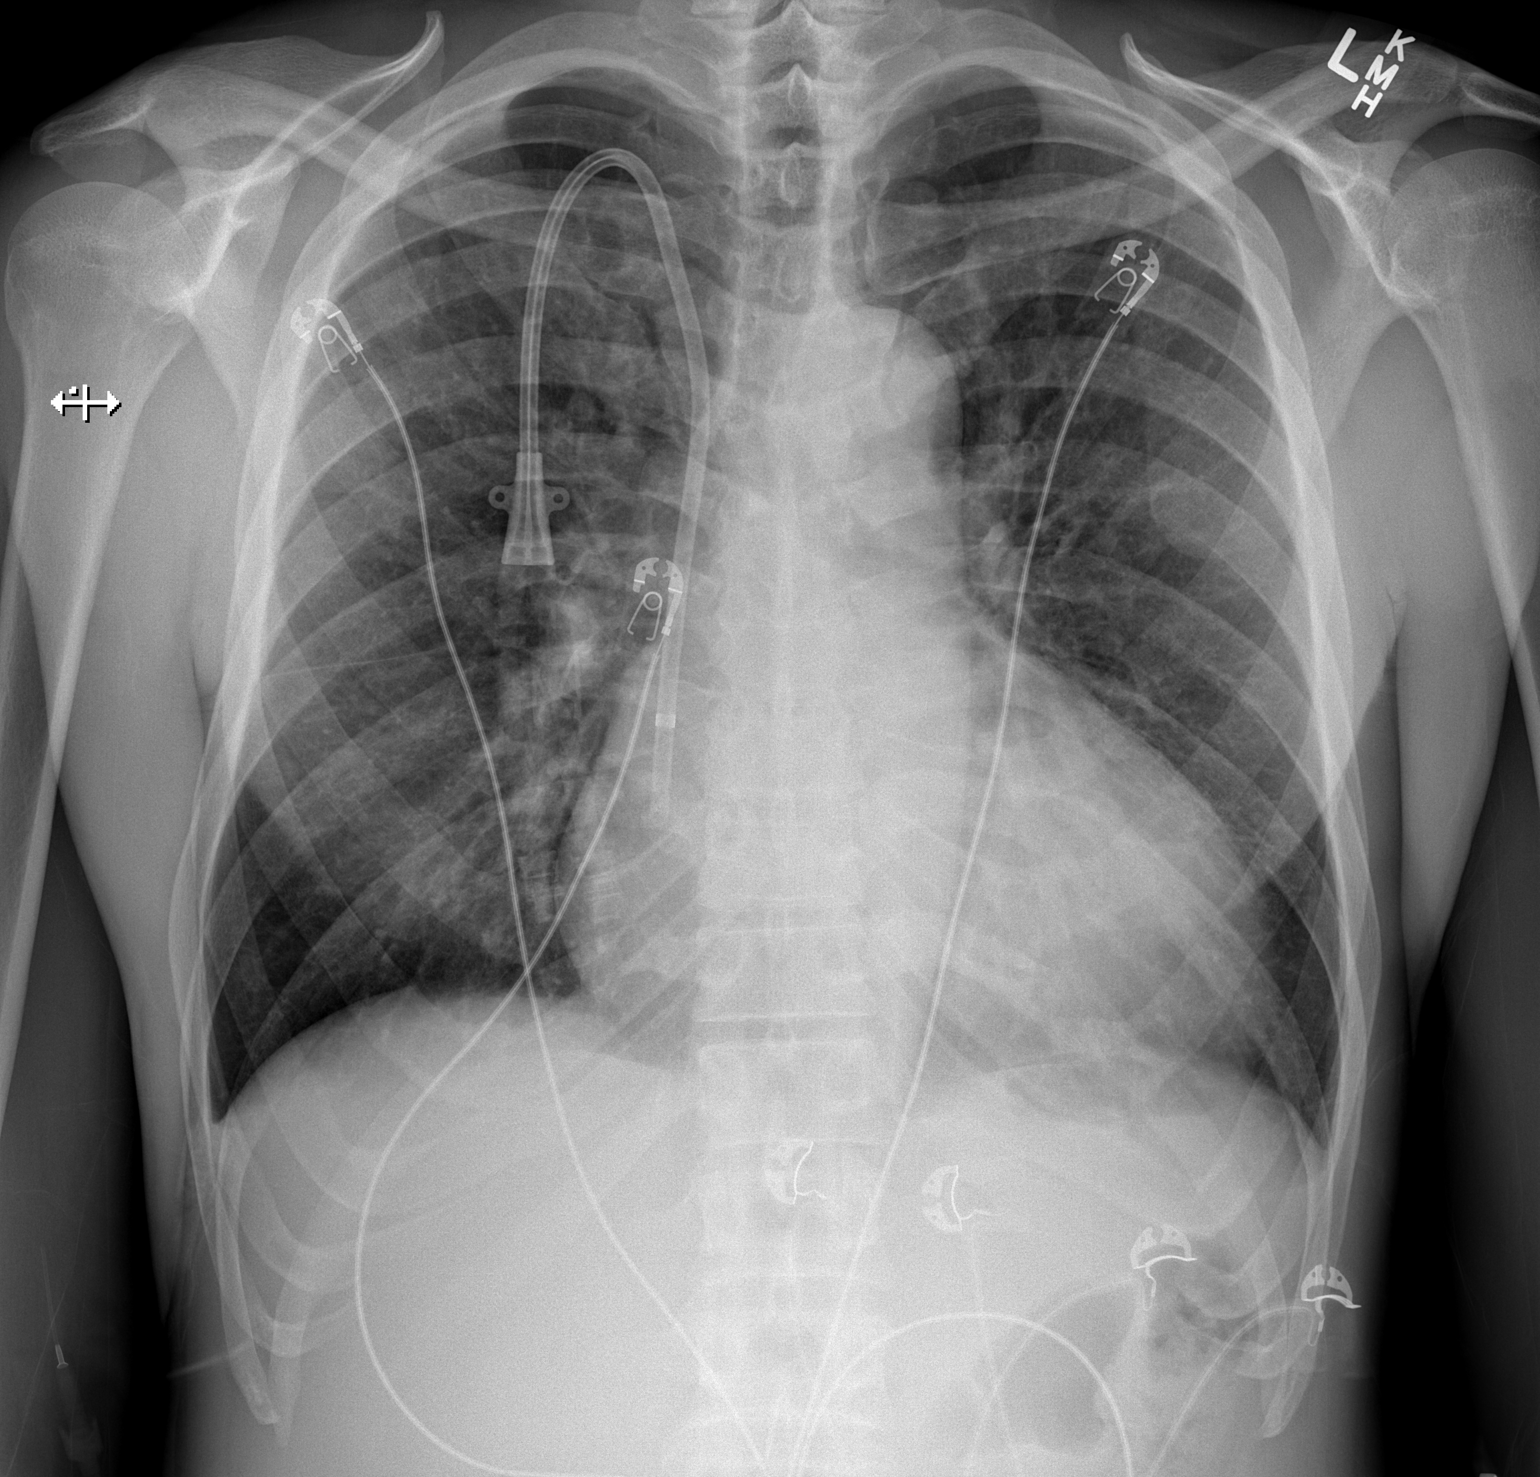

[w chest lat]
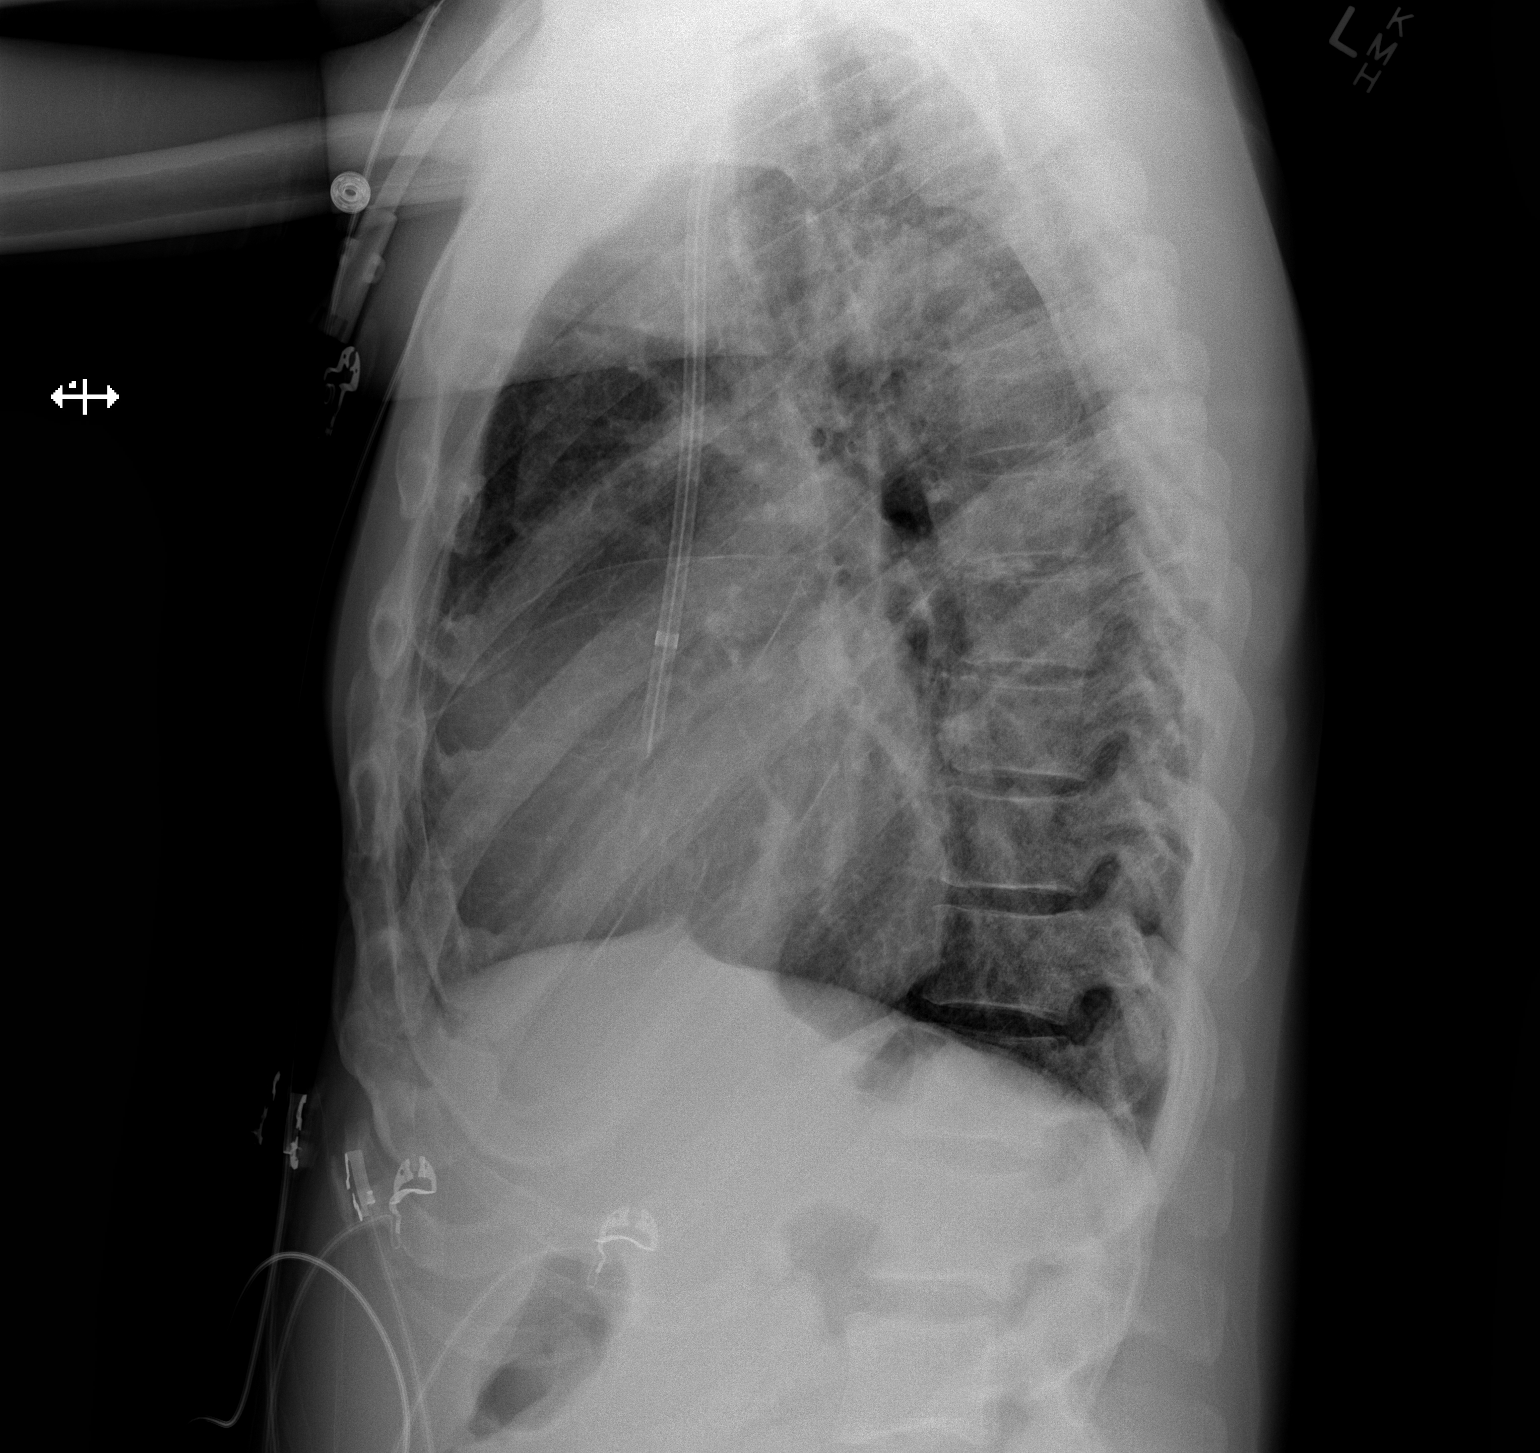

[2 of 2 positions shown; findings below may reference images not displayed]

FINDINGS: The lungs are well-aerated. Vascular congestion is noted, without
definite pulmonary edema. No pleural effusion or pneumothorax is
seen.

The heart is mildly enlarged. A right-sided dual-lumen catheter is
seen ending within the right atrium. No acute osseous abnormalities
are seen.
IMPRESSION: Vascular congestion and mild cardiomegaly, without definite
pulmonary edema.

## 2016-05-24 ENCOUNTER — Emergency Department (HOSPITAL_COMMUNITY)
Admission: EM | Admit: 2016-05-24 | Discharge: 2016-05-24 | Disposition: A | Discharge status: Home / Self Care | Payer: Medicare Other | Source: Home / Self Care | Attending: Emergency Medicine | Admitting: Emergency Medicine

## 2016-05-24 ENCOUNTER — Encounter (HOSPITAL_COMMUNITY): Payer: Self-pay | Admitting: Emergency Medicine

## 2016-05-24 DIAGNOSIS — R55 Syncope and collapse: Secondary | ICD-10-CM

## 2016-05-24 DIAGNOSIS — I132 Hypertensive heart and chronic kidney disease with heart failure and with stage 5 chronic kidney disease, or end stage renal disease: Secondary | ICD-10-CM

## 2016-05-24 DIAGNOSIS — Z992 Dependence on renal dialysis: Secondary | ICD-10-CM | POA: Insufficient documentation

## 2016-05-24 DIAGNOSIS — R404 Transient alteration of awareness: Secondary | ICD-10-CM | POA: Diagnosis not present

## 2016-05-24 DIAGNOSIS — I5042 Chronic combined systolic (congestive) and diastolic (congestive) heart failure: Secondary | ICD-10-CM | POA: Insufficient documentation

## 2016-05-24 DIAGNOSIS — Z87891 Personal history of nicotine dependence: Secondary | ICD-10-CM

## 2016-05-24 DIAGNOSIS — Z7901 Long term (current) use of anticoagulants: Secondary | ICD-10-CM

## 2016-05-24 DIAGNOSIS — R42 Dizziness and giddiness: Secondary | ICD-10-CM | POA: Diagnosis not present

## 2016-05-24 DIAGNOSIS — I429 Cardiomyopathy, unspecified: Secondary | ICD-10-CM | POA: Diagnosis not present

## 2016-05-24 DIAGNOSIS — M549 Dorsalgia, unspecified: Secondary | ICD-10-CM | POA: Diagnosis not present

## 2016-05-24 DIAGNOSIS — N186 End stage renal disease: Secondary | ICD-10-CM

## 2016-05-24 DIAGNOSIS — R112 Nausea with vomiting, unspecified: Secondary | ICD-10-CM | POA: Diagnosis not present

## 2016-05-24 DIAGNOSIS — E875 Hyperkalemia: Secondary | ICD-10-CM | POA: Diagnosis not present

## 2016-05-24 DIAGNOSIS — E1122 Type 2 diabetes mellitus with diabetic chronic kidney disease: Secondary | ICD-10-CM | POA: Diagnosis not present

## 2016-05-24 NOTE — ED Notes (Signed)
Pt is dialysis pt and was at the barber shop today and he  Got dizzy and vomited, pt is T  Th Sat but was seen here in the er and got his time off, , pt states is feeling better and does not want an iv wants to drink and eat

## 2016-05-24 NOTE — Discharge Instructions (Signed)
Return to the emergency room for recurrent symptoms. Follow-up with your dialysis as planned

## 2016-05-24 NOTE — ED Notes (Signed)
Blood drawn by 2 nd tech not enough and clotted, pt states he will not allow anyone else to stick him

## 2016-05-24 NOTE — ED Triage Notes (Signed)
Pt brought in by EMS due to having a syncopal episode while at the barber shop this morning. Per EMS pt was unresponsive for 10 seconds. Pt is HD. Pt a&ox4.

## 2016-05-24 NOTE — ED Provider Notes (Signed)
[place] DEPT Provider Note   CSN: [id number] Arrival date & time: 05/24/16  7939     History   Chief Complaint Chief Complaint  Patient presents with  . Loss of Consciousness    HPI Frank [name] is a 50 y.o. male.  HPI Pt woke up normally this am.  He took his medications first.  He usually eats first but he still felt fine and went for breakfast.  He then went to get his haircut.  He was there for about 45 minutes.  He had the gown tight around his neck.  He started to feel hot and lightheaded.  He asked to go outside.  He stumbled and almost almost passed out.  Bystanders called EMS.  He started to feel better but then he vomited after feeling lightheaded again with all of the excitement.  EMS was called.  He is feeling better now.  He feels well and would like to go.  No cp, abd pain, no cough.  Dialysis patient.  Dialyzed mon, tues and yest.  Got down to dry weight yest of 160. Past Medical History:  Diagnosis Date  . Anemia   . Anxiety   . Chronic combined systolic and diastolic CHF (congestive heart failure) (HCC)    a. EF 20-25% by echo in 08/2015 b. echo 10/2015: EF 35-40%, diffuse HK, severe LAE, moderate RAE, small pericardial effusion  . Complication of anesthesia    itching, sore throat  . Depression   . Dialysis patient ([place])   . ESRD (end stage renal disease) ([place])    due to HTN per patient, followed at [facility], s/p failed kidney transplant - dialysis Tue, Th, Sat  . Hyperkalemia 12/2015  . Hypertension   . Junctional rhythm    a. noted in 08/2015: hyperkalemic at that time  b. 12/2015: presented in junctional rhythm w/ K+ of 6.6. Resolved with improvement of K+ levels.  . Nonischemic cardiomyopathy ([place])    a. 08/2014: cath showing minimal CAD, but tortuous arteries noted.   . Renal insufficiency   . Shortness of breath   . Type II diabetes mellitus (HCC)    No history per patient, but remains under history as A1c would not be accurate given on  dialysis    Patient Active Problem List   Diagnosis Date Noted  . Bradycardia 03/31/2016  . Uremia 03/11/2016  . Nonischemic cardiomyopathy ([place]) 01/09/2016  . Chronic abdominal pain   . SOB (shortness of breath)   . Generalized abdominal pain   . Faintness   . Dyspnea 01/07/2016  . Hyperkalemia 12/16/2015  . Bilateral low back pain without sciatica   . Pericardial effusion 10/30/2015  . Left renal mass 10/30/2015  . Constipation 10/30/2015  . Hypertensive urgency 09/08/2015  . Adjustment disorder with mixed anxiety and depressed mood 08/20/2015  . Chronic epididymitis 06/19/2015  . Groin pain, chronic, right 06/19/2015  . Incarcerated right inguinal hernia 02/16/2015  . Malnutrition of moderate degree ([place]) 02/03/2015  . Left upper extremity deep vein thrombosis ([place]) 02/01/2015  . Drug-seeking behavior 02/01/2015  . Anemia of chronic disease   . Dyslipidemia   . Chronic pulmonary embolism ([place])   . History of noncompliance with medical treatment   . Chronic combined systolic and diastolic CHF (congestive heart failure) ([place])   . Chronic pain syndrome   . Essential hypertension, malignant 04/13/2013  . ESRD on dialysis- Tues, Thurs, Sat in [place], nephro: [name] 04/13/2013    Past Surgical History:  Procedure  Laterality Date  . CAPD INSERTION    . CAPD REMOVAL    . INGUINAL HERNIA REPAIR Right 02/14/2015   Procedure: REPAIR INCARCERATED RIGHT INGUINAL HERNIA;  Surgeon: [name], MD;  Location: [place];  Service: General;  Laterality: Right;  . INSERTION OF DIALYSIS CATHETER Right 09/23/2015   Procedure: exchange of Right internal Dialysis Catheter.;  Surgeon: [name], MD;  Location: [place];  Service: Vascular;  Laterality: Right;  . KIDNEY RECEIPIENT  2006   failed and started HD in March 2014  . LEFT HEART CATHETERIZATION WITH CORONARY ANGIOGRAM N/A 09/02/2014   Procedure: LEFT HEART CATHETERIZATION WITH CORONARY ANGIOGRAM;  Surgeon: [name], MD;   Location: [facility] C[facility] LAB;  Service: Cardiovascular;  Laterality: N/A;       Home Medications    Prior to Admission medications   Medication Sig Start Date End Date Taking? Authorizing Provider  allopurinol (ZYLOPRIM) 100 MG tablet Take 100 mg by mouth daily.   Yes Historical Provider, MD  amLODipine (NORVASC) 10 MG tablet Take 10 mg by mouth daily.   Yes Historical Provider, MD  atorvastatin (LIPITOR) 40 MG tablet Take 1 tablet (40 mg total) by mouth daily at 6 PM. 09/02/14  Yes Barton Dubois, MD  AURYXIA 1 GM 210 MG(Fe) TABS Take 420 mg by mouth 3 (three) times daily. 01/26/16  Yes Historical Provider, MD  carvedilol (COREG) 25 MG tablet Take 25 mg by mouth 2 (two) times daily with a meal.   Yes Historical Provider, MD  cinacalcet (SENSIPAR) 30 MG tablet Take 30 mg by mouth daily.   Yes Historical Provider, MD  doxazosin (CARDURA) 2 MG tablet Take 2 mg by mouth daily.   Yes Historical Provider, MD  famotidine (PEPCID) 20 MG tablet Take 1 tablet (20 mg total) by mouth 2 (two) times daily. 02/13/16  Yes Charlesetta Shanks, MD  gabapentin (NEURONTIN) 100 MG capsule Take 1 capsule (100 mg total) by mouth at bedtime. 05/21/16  Yes [name], DO  hydrALAZINE (APRESOLINE) 100 MG tablet Take 100 mg by mouth every 8 (eight) hours.   Yes Historical Provider, MD  isosorbide mononitrate (IMDUR) 60 MG 24 hr tablet Take 1 tablet (60 mg total) by mouth daily. 01/09/16  Yes Smiley Houseman, MD  omeprazole (PRILOSEC) 20 MG capsule Take 20 mg by mouth daily. 07/01/13  Yes Historical Provider, MD  ondansetron (ZOFRAN ODT) 4 MG disintegrating tablet Take 1 tablet (4 mg total) by mouth every 8 (eight) hours as needed for nausea or vomiting. 05/16/16  Yes Sherwood Gambler, MD  oxycodone (OXY-IR) 5 MG capsule Take 5 mg by mouth every 4 (four) hours as needed for pain.    Yes Historical Provider, MD  predniSONE (DELTASONE) 10 MG tablet Take 10 mg by mouth daily with breakfast.   Yes Historical Provider, MD  sevelamer  carbonate (RENVELA) 800 MG tablet Take 800-1,600 mg by mouth 3 (three) times daily with meals. 2 tabs three times daily with meals, and 1 tablet with snacks   Yes Historical Provider, MD  warfarin (COUMADIN) 2 MG tablet Take 2 mg by mouth daily. Take with 28m tablet to equal 729m7/12/17  Yes Historical Provider, MD  warfarin (COUMADIN) 5 MG tablet Take 5 mg by mouth daily. Take with 107m23mablet to equal 7mg78m12/17  Yes Historical Provider, MD  cloNIDine (CATAPRES - DOSED IN MG/24 HR) 0.3 mg/24hr patch Place 0.3 mg onto the skin once a week.    Historical Provider, MD  Family History Family History  Problem Relation Age of Onset  . Hypertension Other     Social History Social History  Substance Use Topics  . Smoking status: Former Smoker    Packs/day: 0.00    Years: 1.00    Types: Cigarettes  . Smokeless tobacco: Never Used     Comment: quit Jan 2014  . Alcohol use No     Allergies   Butalbital-apap-caffeine; Ferrlecit [na ferric gluc cplx in sucrose]; Minoxidil; Darvocet [propoxyphene n-acetaminophen]; and Other   Review of Systems Review of Systems  All other systems reviewed and are negative.    Physical Exam Updated Vital Signs BP 137/90 (BP Location: Right Arm)   Pulse 71   Temp 97.8 F (36.6 C) (Oral)   Resp 22   Ht _0  (1.88 m)   Wt 72.6 kg   SpO2 100%   BMI 20.54 kg/m   Physical Exam  Constitutional: He appears well-developed and well-nourished. No distress.  HENT:  Head: Normocephalic and atraumatic.  Right Ear: External ear normal.  Left Ear: External ear normal.  Eyes: Conjunctivae are normal. Right eye exhibits no discharge. Left eye exhibits no discharge. No scleral icterus.  Neck: Neck supple. No tracheal deviation present.  Cardiovascular: Normal rate, regular rhythm and intact distal pulses.   Pulmonary/Chest: Effort normal and breath sounds normal. No stridor. No respiratory distress. He has no wheezes. He has no rales.  Abdominal: Soft.  Bowel sounds are normal. He exhibits no distension. There is no tenderness. There is no rebound and no guarding.  Musculoskeletal: He exhibits no edema or tenderness.  Neurological: He is alert. He has normal strength. No cranial nerve deficit (no facial droop, extraocular movements intact, no slurred speech) or sensory deficit. He exhibits normal muscle tone. He displays no seizure activity. Coordination normal.  Skin: Skin is warm and dry. No rash noted.  Psychiatric: He has a normal mood and affect.  Nursing note and vitals reviewed.    ED Treatments / Results  Labs (all labs ordered are listed, but only abnormal results are displayed) Labs Reviewed  CBC  BASIC METABOLIC PANEL    EKG  EKG Interpretation  Date/Time:  Friday May 24 2016 10:11:26 EDT Ventricular Rate:  69 PR Interval:    QRS Duration: 92 QT Interval:  437 QTC Calculation: 469 R Axis:   45 Text Interpretation:  Sinus rhythm Abnrm T, consider ischemia, anterolateral lds Minimal ST elevation, anterior leads No significant change since last tracing Confirmed by Kary Colaizzi  MD-J, Safira Proffit ([id number]) on 05/24/2016 10:17:54 AM       Procedures Procedures (including critical care time)   Initial Impression / Assessment and Plan / ED Course  I have reviewed the triage vital signs and the nursing notes.  Pertinent labs & imaging results that were available during my care of the patient were reviewed by me and considered in my medical decision making (see chart for details).  Clinical Course  Patient had blood drawn twice. The samples were unfortunately inadequate for testing. Patient was monitored in the emergency room for a few hours. He has remained asymptomatic. The patient refuses any further attempts at blood testing.  I suspect his symptoms may have been related to a vasovagal episode. It did start at the barbershop. He did have a tight gown around his neck while he was at the barbershop. He does feel like his symptoms  started after that. it could be related to carotid compression.  Pt has been compliant with  dialysis.  He feels well.  He does not want further attempts at blood testing.  I think that is reasonable at this point.  Final Clinical Impressions(s) / ED Diagnoses   Final diagnoses:  Near syncope    New Prescriptions New Prescriptions   No medications on file     Dorie Rank, MD 05/24/16 1248

## 2016-05-24 NOTE — ED Notes (Signed)
Lab unable to to get labs will send in another tech

## 2016-05-24 NOTE — ED Notes (Signed)
Pt given [place] sand and juice per request

## 2016-05-24 NOTE — ED Notes (Signed)
2nd lab I nto get blood

## 2016-05-25 ENCOUNTER — Encounter (HOSPITAL_COMMUNITY): Payer: Self-pay | Admitting: *Deleted

## 2016-05-25 DIAGNOSIS — E875 Hyperkalemia: Secondary | ICD-10-CM | POA: Diagnosis present

## 2016-05-25 DIAGNOSIS — Z7901 Long term (current) use of anticoagulants: Secondary | ICD-10-CM

## 2016-05-25 DIAGNOSIS — Z87891 Personal history of nicotine dependence: Secondary | ICD-10-CM

## 2016-05-25 DIAGNOSIS — Z8249 Family history of ischemic heart disease and other diseases of the circulatory system: Secondary | ICD-10-CM

## 2016-05-25 DIAGNOSIS — D631 Anemia in chronic kidney disease: Secondary | ICD-10-CM | POA: Diagnosis present

## 2016-05-25 DIAGNOSIS — Z7952 Long term (current) use of systemic steroids: Secondary | ICD-10-CM

## 2016-05-25 DIAGNOSIS — Z79899 Other long term (current) drug therapy: Secondary | ICD-10-CM

## 2016-05-25 DIAGNOSIS — Z888 Allergy status to other drugs, medicaments and biological substances status: Secondary | ICD-10-CM

## 2016-05-25 DIAGNOSIS — F418 Other specified anxiety disorders: Secondary | ICD-10-CM | POA: Diagnosis present

## 2016-05-25 DIAGNOSIS — Z86718 Personal history of other venous thrombosis and embolism: Secondary | ICD-10-CM

## 2016-05-25 DIAGNOSIS — I429 Cardiomyopathy, unspecified: Secondary | ICD-10-CM | POA: Diagnosis present

## 2016-05-25 DIAGNOSIS — N186 End stage renal disease: Secondary | ICD-10-CM | POA: Diagnosis present

## 2016-05-25 DIAGNOSIS — E1122 Type 2 diabetes mellitus with diabetic chronic kidney disease: Secondary | ICD-10-CM | POA: Diagnosis present

## 2016-05-25 DIAGNOSIS — I5042 Chronic combined systolic (congestive) and diastolic (congestive) heart failure: Secondary | ICD-10-CM | POA: Diagnosis present

## 2016-05-25 DIAGNOSIS — Z992 Dependence on renal dialysis: Secondary | ICD-10-CM

## 2016-05-25 DIAGNOSIS — I132 Hypertensive heart and chronic kidney disease with heart failure and with stage 5 chronic kidney disease, or end stage renal disease: Principal | ICD-10-CM | POA: Diagnosis present

## 2016-05-25 DIAGNOSIS — M549 Dorsalgia, unspecified: Secondary | ICD-10-CM | POA: Diagnosis not present

## 2016-05-25 MED ORDER — ONDANSETRON 4 MG PO TBDP
4.0000 mg | ORAL_TABLET | Freq: Once | ORAL | Status: AC | PRN
  Administered 2016-05-25: 4 mg via ORAL

## 2016-05-25 MED ORDER — ONDANSETRON 4 MG PO TBDP
ORAL_TABLET | ORAL | Status: AC
  Filled 2016-05-25: qty 1

## 2016-05-25 NOTE — ED Triage Notes (Signed)
Pt c/o emesis x 2 days and back pain. Last dialysis treatment was Wednesday, reports being due today for dialysis

## 2016-05-26 ENCOUNTER — Inpatient Hospital Stay (HOSPITAL_COMMUNITY)
Admission: EM | Admit: 2016-05-26 | Discharge: 2016-05-27 | DRG: 291 | Disposition: A | Discharge status: Home / Self Care | Payer: Medicare Other | Attending: Nephrology | Admitting: Nephrology

## 2016-05-26 ENCOUNTER — Encounter (HOSPITAL_COMMUNITY): Payer: Self-pay | Admitting: Nephrology

## 2016-05-26 DIAGNOSIS — I428 Other cardiomyopathies: Secondary | ICD-10-CM

## 2016-05-26 DIAGNOSIS — I429 Cardiomyopathy, unspecified: Secondary | ICD-10-CM | POA: Diagnosis present

## 2016-05-26 DIAGNOSIS — Z7901 Long term (current) use of anticoagulants: Secondary | ICD-10-CM | POA: Diagnosis not present

## 2016-05-26 DIAGNOSIS — Z7952 Long term (current) use of systemic steroids: Secondary | ICD-10-CM | POA: Diagnosis not present

## 2016-05-26 DIAGNOSIS — E1122 Type 2 diabetes mellitus with diabetic chronic kidney disease: Secondary | ICD-10-CM | POA: Diagnosis present

## 2016-05-26 DIAGNOSIS — N186 End stage renal disease: Secondary | ICD-10-CM | POA: Diagnosis present

## 2016-05-26 DIAGNOSIS — Z87891 Personal history of nicotine dependence: Secondary | ICD-10-CM | POA: Diagnosis not present

## 2016-05-26 DIAGNOSIS — F418 Other specified anxiety disorders: Secondary | ICD-10-CM | POA: Diagnosis present

## 2016-05-26 DIAGNOSIS — I10 Essential (primary) hypertension: Secondary | ICD-10-CM | POA: Diagnosis present

## 2016-05-26 DIAGNOSIS — Z992 Dependence on renal dialysis: Secondary | ICD-10-CM | POA: Diagnosis not present

## 2016-05-26 DIAGNOSIS — Z86718 Personal history of other venous thrombosis and embolism: Secondary | ICD-10-CM | POA: Diagnosis not present

## 2016-05-26 DIAGNOSIS — D631 Anemia in chronic kidney disease: Secondary | ICD-10-CM | POA: Diagnosis present

## 2016-05-26 DIAGNOSIS — Z888 Allergy status to other drugs, medicaments and biological substances status: Secondary | ICD-10-CM | POA: Diagnosis not present

## 2016-05-26 DIAGNOSIS — E875 Hyperkalemia: Secondary | ICD-10-CM

## 2016-05-26 DIAGNOSIS — N189 Chronic kidney disease, unspecified: Secondary | ICD-10-CM

## 2016-05-26 DIAGNOSIS — I5042 Chronic combined systolic (congestive) and diastolic (congestive) heart failure: Secondary | ICD-10-CM | POA: Diagnosis present

## 2016-05-26 DIAGNOSIS — Z79899 Other long term (current) drug therapy: Secondary | ICD-10-CM | POA: Diagnosis not present

## 2016-05-26 DIAGNOSIS — M549 Dorsalgia, unspecified: Secondary | ICD-10-CM

## 2016-05-26 DIAGNOSIS — I132 Hypertensive heart and chronic kidney disease with heart failure and with stage 5 chronic kidney disease, or end stage renal disease: Secondary | ICD-10-CM | POA: Diagnosis present

## 2016-05-26 DIAGNOSIS — E1159 Type 2 diabetes mellitus with other circulatory complications: Secondary | ICD-10-CM | POA: Diagnosis present

## 2016-05-26 DIAGNOSIS — Z8249 Family history of ischemic heart disease and other diseases of the circulatory system: Secondary | ICD-10-CM | POA: Diagnosis not present

## 2016-05-26 HISTORY — DX: Personal history of other venous thrombosis and embolism: Z86.718

## 2016-05-26 LAB — I-STAT CHEM 8, ED
BUN: 48 mg/dL — AB (ref 6–20)
CALCIUM ION: 0.95 mmol/L — AB (ref 1.15–1.40)
CHLORIDE: 100 mmol/L — AB (ref 101–111)
Creatinine, Ser: 13.9 mg/dL — ABNORMAL HIGH (ref 0.61–1.24)
GLUCOSE: 95 mg/dL (ref 65–99)
HCT: 28 % — ABNORMAL LOW (ref 39.0–52.0)
Hemoglobin: 9.5 g/dL — ABNORMAL LOW (ref 13.0–17.0)
Potassium: 6 mmol/L — ABNORMAL HIGH (ref 3.5–5.1)
Sodium: 140 mmol/L (ref 135–145)
TCO2: 28 mmol/L (ref 0–100)

## 2016-05-26 MED ORDER — GABAPENTIN 100 MG PO CAPS
100.0000 mg | ORAL_CAPSULE | Freq: Every day | ORAL | Status: DC
  Administered 2016-05-26: 100 mg via ORAL
  Filled 2016-05-26: qty 1

## 2016-05-26 MED ORDER — LIDOCAINE-PRILOCAINE 2.5-2.5 % EX CREA: 1.0000 "application " | TOPICAL_CREAM | CUTANEOUS | Status: DC | PRN

## 2016-05-26 MED ORDER — PENTAFLUOROPROP-TETRAFLUOROETH EX AERO: 1.0000 "application " | INHALATION_SPRAY | CUTANEOUS | Status: DC | PRN

## 2016-05-26 MED ORDER — LIDOCAINE HCL (PF) 1 % IJ SOLN: 5.0000 mL | INTRAMUSCULAR | Status: DC | PRN

## 2016-05-26 MED ORDER — SODIUM CHLORIDE 0.9% FLUSH: 3.0000 mL | Freq: Two times a day (BID) | INTRAVENOUS | Status: DC

## 2016-05-26 MED ORDER — POLYETHYLENE GLYCOL 3350 17 G PO PACK: 17.0000 g | PACK | Freq: Every day | ORAL | Status: DC | PRN

## 2016-05-26 MED ORDER — AMLODIPINE BESYLATE 10 MG PO TABS
10.0000 mg | ORAL_TABLET | Freq: Every day | ORAL | Status: DC
  Administered 2016-05-26 – 2016-05-27 (×2): 10 mg via ORAL
  Filled 2016-05-26 (×2): qty 1

## 2016-05-26 MED ORDER — DOXAZOSIN MESYLATE 2 MG PO TABS
2.0000 mg | ORAL_TABLET | Freq: Every day | ORAL | Status: DC
  Administered 2016-05-26 – 2016-05-27 (×2): 2 mg via ORAL
  Filled 2016-05-26 (×2): qty 1

## 2016-05-26 MED ORDER — CARVEDILOL 25 MG PO TABS
25.0000 mg | ORAL_TABLET | Freq: Two times a day (BID) | ORAL | Status: DC
  Administered 2016-05-26: 25 mg via ORAL
  Filled 2016-05-26 (×2): qty 1

## 2016-05-26 MED ORDER — BISACODYL 10 MG RE SUPP: 10.0000 mg | Freq: Every day | RECTAL | Status: DC | PRN

## 2016-05-26 MED ORDER — SEVELAMER CARBONATE 800 MG PO TABS
1600.0000 mg | ORAL_TABLET | Freq: Three times a day (TID) | ORAL | Status: DC
  Administered 2016-05-26 – 2016-05-27 (×2): 1600 mg via ORAL
  Filled 2016-05-26 (×2): qty 2

## 2016-05-26 MED ORDER — PANTOPRAZOLE SODIUM 40 MG PO TBEC
40.0000 mg | DELAYED_RELEASE_TABLET | Freq: Every day | ORAL | Status: DC
  Administered 2016-05-26 – 2016-05-27 (×2): 40 mg via ORAL
  Filled 2016-05-26 (×2): qty 1

## 2016-05-26 MED ORDER — HYDRALAZINE HCL 50 MG PO TABS
100.0000 mg | ORAL_TABLET | Freq: Three times a day (TID) | ORAL | Status: DC
Start: 2016-05-26 — End: 2016-05-27
  Administered 2016-05-26 (×2): 100 mg via ORAL
  Filled 2016-05-26 (×3): qty 2

## 2016-05-26 MED ORDER — ACETAMINOPHEN 650 MG RE SUPP: 650.0000 mg | Freq: Four times a day (QID) | RECTAL | Status: DC | PRN

## 2016-05-26 MED ORDER — WARFARIN - PHYSICIAN DOSING INPATIENT: Freq: Every day | Status: DC

## 2016-05-26 MED ORDER — FAMOTIDINE 20 MG PO TABS
20.0000 mg | ORAL_TABLET | Freq: Two times a day (BID) | ORAL | Status: DC
  Administered 2016-05-26 – 2016-05-27 (×2): 20 mg via ORAL
  Filled 2016-05-26 (×2): qty 1

## 2016-05-26 MED ORDER — PREDNISONE 10 MG PO TABS
10.0000 mg | ORAL_TABLET | Freq: Every day | ORAL | Status: DC
  Administered 2016-05-27: 10 mg via ORAL
  Filled 2016-05-26: qty 1

## 2016-05-26 MED ORDER — SODIUM CHLORIDE 0.9% FLUSH
3.0000 mL | Freq: Two times a day (BID) | INTRAVENOUS | Status: DC
  Administered 2016-05-26 (×2): 3 mL via INTRAVENOUS

## 2016-05-26 MED ORDER — SODIUM CHLORIDE 0.9 % IV SOLN: 100.0000 mL | INTRAVENOUS | Status: DC | PRN

## 2016-05-26 MED ORDER — DEXTROSE 50 % IV SOLN
2.0000 | Freq: Once | INTRAVENOUS | Status: AC
  Administered 2016-05-26: 100 mL via INTRAVENOUS
  Filled 2016-05-26: qty 100

## 2016-05-26 MED ORDER — CLONIDINE HCL 0.3 MG/24HR TD PTWK
0.3000 mg | MEDICATED_PATCH | TRANSDERMAL | Status: DC
  Filled 2016-05-26: qty 1

## 2016-05-26 MED ORDER — ALLOPURINOL 100 MG PO TABS
100.0000 mg | ORAL_TABLET | Freq: Every day | ORAL | Status: DC
  Administered 2016-05-26 – 2016-05-27 (×2): 100 mg via ORAL
  Filled 2016-05-26 (×2): qty 1

## 2016-05-26 MED ORDER — OXYCODONE HCL 5 MG PO TABS: 5.0000 mg | ORAL_TABLET | ORAL | Status: DC | PRN

## 2016-05-26 MED ORDER — ATORVASTATIN CALCIUM 40 MG PO TABS
40.0000 mg | ORAL_TABLET | Freq: Every day | ORAL | Status: DC
  Administered 2016-05-26: 40 mg via ORAL
  Filled 2016-05-26: qty 1

## 2016-05-26 MED ORDER — HEPARIN SODIUM (PORCINE) 1000 UNIT/ML DIALYSIS: 1000.0000 [IU] | INTRAMUSCULAR | Status: DC | PRN

## 2016-05-26 MED ORDER — ACETAMINOPHEN 325 MG PO TABS: 650.0000 mg | ORAL_TABLET | Freq: Four times a day (QID) | ORAL | Status: DC | PRN

## 2016-05-26 MED ORDER — ISOSORBIDE MONONITRATE ER 60 MG PO TB24
60.0000 mg | ORAL_TABLET | Freq: Every day | ORAL | Status: DC
  Administered 2016-05-26 – 2016-05-27 (×2): 60 mg via ORAL
  Filled 2016-05-26 (×2): qty 1

## 2016-05-26 MED ORDER — SODIUM CHLORIDE 0.9 % IV SOLN
2.0000 g | Freq: Once | INTRAVENOUS | Status: AC
  Administered 2016-05-26: 2 g via INTRAVENOUS
  Filled 2016-05-26: qty 20

## 2016-05-26 MED ORDER — INSULIN ASPART 100 UNIT/ML IV SOLN
10.0000 [IU] | Freq: Once | INTRAVENOUS | Status: AC
  Administered 2016-05-26: 10 [IU] via INTRAVENOUS
  Filled 2016-05-26: qty 1

## 2016-05-26 MED ORDER — SODIUM CHLORIDE 0.9 % IV SOLN: 250.0000 mL | INTRAVENOUS | Status: DC | PRN

## 2016-05-26 MED ORDER — WARFARIN SODIUM 6 MG PO TABS
7.0000 mg | ORAL_TABLET | Freq: Every day | ORAL | Status: DC
  Administered 2016-05-26: 7 mg via ORAL
  Filled 2016-05-26: qty 1

## 2016-05-26 MED ORDER — CINACALCET HCL 30 MG PO TABS
30.0000 mg | ORAL_TABLET | Freq: Every day | ORAL | Status: DC
  Administered 2016-05-27: 30 mg via ORAL
  Filled 2016-05-26: qty 1

## 2016-05-26 MED ORDER — OXYCODONE HCL 5 MG PO TABS
5.0000 mg | ORAL_TABLET | Freq: Once | ORAL | Status: AC
  Administered 2016-05-26: 5 mg via ORAL
  Filled 2016-05-26: qty 1

## 2016-05-26 MED ORDER — LACTULOSE 10 GM/15ML PO SOLN
30.0000 g | Freq: Once | ORAL | Status: DC
  Filled 2016-05-26: qty 45

## 2016-05-26 MED ORDER — ALTEPLASE 2 MG IJ SOLR
2.0000 mg | Freq: Once | INTRAMUSCULAR | Status: DC | PRN
Start: 2016-05-26 — End: 2016-05-27

## 2016-05-26 MED ORDER — SODIUM CHLORIDE 0.9% FLUSH: 3.0000 mL | INTRAVENOUS | Status: DC | PRN

## 2016-05-26 NOTE — ED Notes (Signed)
Apple Juice given

## 2016-05-26 NOTE — ED Notes (Signed)
Meal tray ordered 

## 2016-05-26 NOTE — ED Notes (Signed)
Meal tray at bedside. Gave Pt apple juice X2

## 2016-05-26 NOTE — ED Provider Notes (Addendum)
[place] DEPT Provider Note   CSN: [id number] Arrival date & time: 05/25/16  2328  By signing my name below, I, Soijett Blue, attest that this documentation has been prepared under the direction and in the presence of Everlene Balls, MD. Electronically Signed: Soijett Blue, ED Scribe. 05/26/16. 1:34 AM.   History   Chief Complaint Chief Complaint  Patient presents with  . Emesis    HPI  Frank [name] is a 50 y.o. male with a PMHx of ESRD, HTN, who presents to the Emergency Department complaining of vomiting onset 2 days. Pt states that his last round of dialysis was on Wednesday (05/22/2016) and he was due for another round of dialysis on yesterday to which he didn't go to. He states that he is having associated symptoms of back pain. Pt denies pain anywhere else at this time. He states that he has not tried any medications for the relief for his symptoms. He denies any other symptoms.   The history is provided by the patient. No language interpreter was used.    Past Medical History:  Diagnosis Date  . Anemia   . Anxiety   . Chronic combined systolic and diastolic CHF (congestive heart failure) (HCC)    a. EF 20-25% by echo in 08/2015 b. echo 10/2015: EF 35-40%, diffuse HK, severe LAE, moderate RAE, small pericardial effusion  . Complication of anesthesia    itching, sore throat  . Depression   . Dialysis patient ([place])   . ESRD (end stage renal disease) ([place])    due to HTN per patient, followed at [facility], s/p failed kidney transplant - dialysis Tue, Th, Sat  . Hyperkalemia 12/2015  . Hypertension   . Junctional rhythm    a. noted in 08/2015: hyperkalemic at that time  b. 12/2015: presented in junctional rhythm w/ K+ of 6.6. Resolved with improvement of K+ levels.  . Nonischemic cardiomyopathy ([place])    a. 08/2014: cath showing minimal CAD, but tortuous arteries noted.   . Renal insufficiency   . Shortness of breath   . Type II diabetes mellitus (HCC)    No history per  patient, but remains under history as A1c would not be accurate given on dialysis    Patient Active Problem List   Diagnosis Date Noted  . Bradycardia 03/31/2016  . Uremia 03/11/2016  . Nonischemic cardiomyopathy ([place]) 01/09/2016  . Chronic abdominal pain   . SOB (shortness of breath)   . Generalized abdominal pain   . Faintness   . Dyspnea 01/07/2016  . Hyperkalemia 12/16/2015  . Bilateral low back pain without sciatica   . Pericardial effusion 10/30/2015  . Left renal mass 10/30/2015  . Constipation 10/30/2015  . Hypertensive urgency 09/08/2015  . Adjustment disorder with mixed anxiety and depressed mood 08/20/2015  . Chronic epididymitis 06/19/2015  . Groin pain, chronic, right 06/19/2015  . Incarcerated right inguinal hernia 02/16/2015  . Malnutrition of moderate degree ([place]) 02/03/2015  . Left upper extremity deep vein thrombosis ([place]) 02/01/2015  . Drug-seeking behavior 02/01/2015  . Anemia of chronic disease   . Dyslipidemia   . Chronic pulmonary embolism ([place])   . History of noncompliance with medical treatment   . Chronic combined systolic and diastolic CHF (congestive heart failure) ([place])   . Chronic pain syndrome   . Essential hypertension, malignant 04/13/2013  . ESRD on dialysis- Tues, Thurs, Sat in [place], nephro: [name] 04/13/2013    Past Surgical History:  Procedure Laterality Date  . CAPD INSERTION    .  CAPD REMOVAL    . INGUINAL HERNIA REPAIR Right 02/14/2015   Procedure: REPAIR INCARCERATED RIGHT INGUINAL HERNIA;  Surgeon: [name], MD;  Location: [facility];  Service: General;  Laterality: Right;  . INSERTION OF DIALYSIS CATHETER Right 09/23/2015   Procedure: exchange of Right internal Dialysis Catheter.;  Surgeon: [name], MD;  Location: [facility];  Service: Vascular;  Laterality: Right;  . KIDNEY RECEIPIENT  2006   failed and started HD in March 2014  . LEFT HEART CATHETERIZATION WITH CORONARY ANGIOGRAM N/A 09/02/2014   Procedure: LEFT HEART  CATHETERIZATION WITH CORONARY ANGIOGRAM;  Surgeon: [name], MD;  Location: [facility] C[facility] LAB;  Service: Cardiovascular;  Laterality: N/A;       Home Medications    Prior to Admission medications   Medication Sig Start Date End Date Taking? Authorizing Provider  allopurinol (ZYLOPRIM) 100 MG tablet Take 100 mg by mouth daily.   Yes Historical Provider, MD  amLODipine (NORVASC) 10 MG tablet Take 10 mg by mouth daily.   Yes Historical Provider, MD  atorvastatin (LIPITOR) 40 MG tablet Take 1 tablet (40 mg total) by mouth daily at 6 PM. 09/02/14  Yes Barton Dubois, MD  AURYXIA 1 GM 210 MG(Fe) TABS Take 420 mg by mouth 3 (three) times daily. 01/26/16  Yes Historical Provider, MD  carvedilol (COREG) 25 MG tablet Take 25 mg by mouth 2 (two) times daily with a meal.   Yes Historical Provider, MD  cinacalcet (SENSIPAR) 30 MG tablet Take 30 mg by mouth daily.   Yes Historical Provider, MD  cloNIDine (CATAPRES - DOSED IN MG/24 HR) 0.3 mg/24hr patch Place 0.3 mg onto the skin once a week.   Yes Historical Provider, MD  doxazosin (CARDURA) 2 MG tablet Take 2 mg by mouth daily.   Yes Historical Provider, MD  famotidine (PEPCID) 20 MG tablet Take 1 tablet (20 mg total) by mouth 2 (two) times daily. 02/13/16  Yes Charlesetta Shanks, MD  gabapentin (NEURONTIN) 100 MG capsule Take 1 capsule (100 mg total) by mouth at bedtime. 05/21/16  Yes [name], DO  hydrALAZINE (APRESOLINE) 100 MG tablet Take 100 mg by mouth every 8 (eight) hours.   Yes Historical Provider, MD  isosorbide mononitrate (IMDUR) 60 MG 24 hr tablet Take 1 tablet (60 mg total) by mouth daily. 01/09/16  Yes Smiley Houseman, MD  omeprazole (PRILOSEC) 20 MG capsule Take 20 mg by mouth daily. 07/01/13  Yes Historical Provider, MD  ondansetron (ZOFRAN ODT) 4 MG disintegrating tablet Take 1 tablet (4 mg total) by mouth every 8 (eight) hours as needed for nausea or vomiting. 05/16/16  Yes Sherwood Gambler, MD  oxycodone (OXY-IR) 5 MG capsule Take 5 mg  by mouth every 4 (four) hours as needed for pain.    Yes Historical Provider, MD  predniSONE (DELTASONE) 10 MG tablet Take 10 mg by mouth daily with breakfast.   Yes Historical Provider, MD  sevelamer carbonate (RENVELA) 800 MG tablet Take 800-1,600 mg by mouth 3 (three) times daily with meals. 2 tabs three times daily with meals, and 1 tablet with snacks   Yes Historical Provider, MD  warfarin (COUMADIN) 2 MG tablet Take 2 mg by mouth daily. Take with 90m tablet to equal 723m7/12/17  Yes Historical Provider, MD  warfarin (COUMADIN) 5 MG tablet Take 5 mg by mouth daily. Take with 90m790mablet to equal 7mg36m12/17  Yes Historical Provider, MD    Family History Family History  Problem Relation Age  of Onset  . Hypertension Other     Social History Social History  Substance Use Topics  . Smoking status: Former Smoker    Packs/day: 0.00    Years: 1.00    Types: Cigarettes  . Smokeless tobacco: Never Used     Comment: quit Jan 2014  . Alcohol use No     Allergies   Butalbital-apap-caffeine; Ferrlecit [na ferric gluc cplx in sucrose]; Minoxidil; Darvocet [propoxyphene n-acetaminophen]; and Other   Review of Systems Review of Systems A complete 10 system review of systems was obtained and all systems are negative except as noted in the HPI and PMH.   Physical Exam Updated Vital Signs BP 148/87   Pulse 71   Temp 97.9 F (36.6 C) (Oral)   Resp 18   SpO2 100%   Physical Exam  Constitutional: He is oriented to person, place, and time. Vital signs are normal. He appears well-developed and well-nourished.  Non-toxic appearance. He does not appear ill. No distress.  HENT:  Head: Normocephalic and atraumatic.  Nose: Nose normal.  Mouth/Throat: Oropharynx is clear and moist. No oropharyngeal exudate.  Eyes: Conjunctivae and EOM are normal. Pupils are equal, round, and reactive to light. No scleral icterus.  Neck: Normal range of motion. Neck supple. No tracheal deviation, no edema, no  erythema and normal range of motion present. No thyroid mass and no thyromegaly present.  Cardiovascular: Normal rate, regular rhythm, S1 normal, S2 normal, normal heart sounds, intact distal pulses and normal pulses.  Exam reveals no gallop and no friction rub.   No murmur heard. Pulmonary/Chest: Effort normal and breath sounds normal. No respiratory distress. He has no wheezes. He has no rhonchi. He has no rales.  Right chest wall with HD catheter.   Abdominal: Soft. Normal appearance and bowel sounds are normal. He exhibits no distension, no ascites and no mass. There is no hepatosplenomegaly. There is no tenderness. There is no rebound, no guarding and no CVA tenderness.  Musculoskeletal: Normal range of motion. He exhibits no edema or tenderness.  No TTP to back  Lymphadenopathy:    He has no cervical adenopathy.  Neurological: He is alert and oriented to person, place, and time. He has normal strength. No cranial nerve deficit or sensory deficit.  Skin: Skin is warm, dry and intact. No petechiae and no rash noted. He is not diaphoretic. No erythema. No pallor.  Nursing note and vitals reviewed.    ED Treatments / Results  DIAGNOSTIC STUDIES: Oxygen Saturation is 100% on RA, nl by my interpretation.    COORDINATION OF CARE: 1:16 AM Discussed treatment plan with pt at bedside which includes labs, EKG, and pt agreed to plan.  2:54 AM- Consult with Nephrologist, Dr. Kelly Splinter, who recommends follow up call in the morning regarding the pt and dialysis.   Labs (all labs ordered are listed, but only abnormal results are displayed) Labs Reviewed  I-STAT CHEM 8, ED - Abnormal; Notable for the following:       Result Value   Potassium 6.0 (*)    Chloride 100 (*)    BUN 48 (*)    Creatinine, Ser 13.90 (*)    Calcium, Ion 0.95 (*)    Hemoglobin 9.5 (*)    HCT 28.0 (*)    All other components within normal limits    EKG  EKG Interpretation  Date/Time:  Sunday May 26 2016  01:30:29 EDT Ventricular Rate:  73 PR Interval:    QRS Duration: 105 QT  Interval:  436 QTC Calculation: 481 R Axis:   -4 Text Interpretation:  Sinus rhythm LVH with secondary repolarization abnormality Borderline prolonged QT interval Baseline wander in lead(s) V3 no peaked t waves TWI in V4 has now resolved Confirmed by Glynn Octave [phone]) on 05/26/2016 1:35:50 AM       Procedures Procedures (including critical care time)  Medications Ordered in ED Medications  ondansetron (ZOFRAN-ODT) disintegrating tablet 4 mg (4 mg Oral Given 05/25/16 2340)     Initial Impression / Assessment and Plan / ED Course  I have reviewed the triage vital signs and the nursing notes.  Pertinent labs & imaging results that were available during my care of the patient were reviewed by me and considered in my medical decision making (see chart for details).  Clinical Course    PAtient presents to the ED for back pain.  His physical exam is normal.  He has not had dialysis in 4 days, will check a potassium level.  His O2 is normal.   Potassium is 6.0.  Will page nephrology for dialysis session in the morning. EKG does not show any acute changes.  I spoke with Dr. Jonnie Finner with nephrology who will evaluate the patient in the morning for dialysis.  Patient will be given calcium, dextrose, insulin and lactulose in the ED to help with his potassium for now.    There has been a delay in administration of medications due to IV access.  IV team has been consulted.  I will also attempt  Angiocath insertion Performed by: Everlene Balls  Consent: Verbal consent obtained. Risks and benefits: risks, benefits and alternatives were discussed Time out: Immediately prior to procedure a "time out" was called to verify the correct patient, procedure, equipment, support staff and site/side marked as required.  Preparation: Patient was prepped and draped in the usual sterile fashion.  Vein Location: L brachial  vein  Ultrasound Guided  Gauge: 20G  Normal blood return and flush without difficulty Patient tolerance: Patient tolerated the procedure well with no immediate complications.     Final Clinical Impressions(s) / ED Diagnoses   Final diagnoses:  None    New Prescriptions New Prescriptions   No medications on file     I personally performed the services described in this documentation, which was scribed in my presence. The recorded information has been reviewed and is accurate.          Everlene Balls, MD 05/26/16 [phone]

## 2016-05-26 NOTE — ED Notes (Signed)
Pt requesting pain medication, provider made aware

## 2016-05-26 NOTE — ED Notes (Signed)
Pt refusing to wear blood pressure cuff or pulse ox

## 2016-05-26 NOTE — ED Notes (Signed)
Meal tray at bedside.  

## 2016-05-26 NOTE — Progress Notes (Signed)
Patient arrived on unit via stretcher from ED. No family at bedside.  Telemetry placed per MD order. CMT notified. Patient refused complete skin assessment.

## 2016-05-26 NOTE — H&P (Addendum)
[facility] History and Physical  Frank [name] YFV:[id number] DOB: Dec 20, 1965 DOA: 05/26/2016  Referring physician: Dr. Claudine Mouton PCP: Angelica Chessman, MD   Chief Complaint: needs dialysis  HPI: Frank [name] is a 50 y.o. male with history of DM2, HTN, NICM and ESRD.  He is currently w/o a hemodialysis unit, goes to [facility] and/ or [facility] and is getting inpatient dialysis.  Comes to ED asking for dialysis, feeling tired , K 6.0.  Asked to see for dialysis.    Patient says he is without a HD unit for about the last month.  He is working to get in with a unit in [place], says that he had a fistula placed about a month ago and is has been worked on twice ,and that when he has the fistula matured that he is hoping to be accepted by the group there for outpatient HD.  He says the [facility] won't accept him for outpt HD and this has been the case for a while.  He has been getting inpatient HD for the last month either at [facility] [place] or at [facility].  He says he comes frequently but often gets turned down for HD if his labs "don't show that he needs it".    Also reports history of "DVT" approx 2 yrs ago and was supposed to be treated for 3-6 months but they have kept him on coumadin for the last approx 1-2 years.  Review of chart shows patient had a LUL subsegmental PE presenting with CP in Oct 2015, which at the time was suspected to be related to his HD catheter.  In May of 2016 he had a RUE doppler showing acute/ subacute thrombosis in the brachial/ basilic veins and was restarted/ resumed on coumadin.  No PE associated w that finding.   Had renal transplant 2006 > 2014 and went back on HD around 2014.      ROS  denies CP/SOB/ cough  no joint pain   no HA  no blurry vision  no rash  no diarrhea  no nausea/ vomiting  no dysuria  no difficulty voiding  no change in urine color    Past Medical History  Past Medical History:  Diagnosis Date  . Anemia   . Anxiety   . Chronic  combined systolic and diastolic CHF (congestive heart failure) (HCC)    a. EF 20-25% by echo in 08/2015 b. echo 10/2015: EF 35-40%, diffuse HK, severe LAE, moderate RAE, small pericardial effusion  . Complication of anesthesia    itching, sore throat  . Depression   . Dialysis patient ([place])   . ESRD (end stage renal disease) ([place])    due to HTN per patient, followed at [facility], s/p failed kidney transplant - dialysis Tue, Th, Sat  . Hyperkalemia 12/2015  . Hypertension   . Junctional rhythm    a. noted in 08/2015: hyperkalemic at that time  b. 12/2015: presented in junctional rhythm w/ K+ of 6.6. Resolved with improvement of K+ levels.  . Nonischemic cardiomyopathy ([place])    a. 08/2014: cath showing minimal CAD, but tortuous arteries noted.   . Renal insufficiency   . Shortness of breath   . Type II diabetes mellitus (HCC)    No history per patient, but remains under history as A1c would not be accurate given on dialysis   Past Surgical History  Past Surgical History:  Procedure Laterality Date  . CAPD INSERTION    . CAPD REMOVAL    . INGUINAL  HERNIA REPAIR Right 02/14/2015   Procedure: REPAIR INCARCERATED RIGHT INGUINAL HERNIA;  Surgeon: [name], MD;  Location: [facility];  Service: General;  Laterality: Right;  . INSERTION OF DIALYSIS CATHETER Right 09/23/2015   Procedure: exchange of Right internal Dialysis Catheter.;  Surgeon: [name], MD;  Location: [place];  Service: Vascular;  Laterality: Right;  . KIDNEY RECEIPIENT  2006   failed and started HD in March 2014  . LEFT HEART CATHETERIZATION WITH CORONARY ANGIOGRAM N/A 09/02/2014   Procedure: LEFT HEART CATHETERIZATION WITH CORONARY ANGIOGRAM;  Surgeon: [name], MD;  Location: [facility] CATH LAB;  Service: Cardiovascular;  Laterality: N/A;   Family History  Family History  Problem Relation Age of Onset  . Hypertension Other    Social History  reports that he has quit smoking. His smoking use included Cigarettes. He smoked  0.00 packs per day for 1.00 year. He has never used smokeless tobacco. He reports that he does not drink alcohol or use drugs. Allergies  Allergies  Allergen Reactions  . Butalbital-Apap-Caffeine Shortness Of Breath and Swelling    Swelling in throat  . Ferrlecit [Na Ferric Gluc Cplx In Sucrose] Shortness Of Breath, Swelling and Other (See Comments)    Swelling in throat  . Minoxidil Shortness Of Breath  . Darvocet [Propoxyphene N-Acetaminophen] Hives  . Other Hives    Allergy listed on file from [facility] (Pt did not mention any allergies) Pt able to do norco (tylenol)   Home medications Prior to Admission medications   Medication Sig Start Date End Date Taking? Authorizing Provider  allopurinol (ZYLOPRIM) 100 MG tablet Take 100 mg by mouth daily.   Yes Historical Provider, MD  amLODipine (NORVASC) 10 MG tablet Take 10 mg by mouth daily.   Yes Historical Provider, MD  atorvastatin (LIPITOR) 40 MG tablet Take 1 tablet (40 mg total) by mouth daily at 6 PM. 09/02/14  Yes Barton Dubois, MD  AURYXIA 1 GM 210 MG(Fe) TABS Take 420 mg by mouth 3 (three) times daily. 01/26/16  Yes Historical Provider, MD  carvedilol (COREG) 25 MG tablet Take 25 mg by mouth 2 (two) times daily with a meal.   Yes Historical Provider, MD  cinacalcet (SENSIPAR) 30 MG tablet Take 30 mg by mouth daily.   Yes Historical Provider, MD  cloNIDine (CATAPRES - DOSED IN MG/24 HR) 0.3 mg/24hr patch Place 0.3 mg onto the skin once a week.   Yes Historical Provider, MD  doxazosin (CARDURA) 2 MG tablet Take 2 mg by mouth daily.   Yes Historical Provider, MD  famotidine (PEPCID) 20 MG tablet Take 1 tablet (20 mg total) by mouth 2 (two) times daily. 02/13/16  Yes Charlesetta Shanks, MD  gabapentin (NEURONTIN) 100 MG capsule Take 1 capsule (100 mg total) by mouth at bedtime. 05/21/16  Yes [name], DO  hydrALAZINE (APRESOLINE) 100 MG tablet Take 100 mg by mouth every 8 (eight) hours.   Yes Historical Provider, MD  isosorbide  mononitrate (IMDUR) 60 MG 24 hr tablet Take 1 tablet (60 mg total) by mouth daily. 01/09/16  Yes Smiley Houseman, MD  omeprazole (PRILOSEC) 20 MG capsule Take 20 mg by mouth daily. 07/01/13  Yes Historical Provider, MD  ondansetron (ZOFRAN ODT) 4 MG disintegrating tablet Take 1 tablet (4 mg total) by mouth every 8 (eight) hours as needed for nausea or vomiting. 05/16/16  Yes Sherwood Gambler, MD  oxycodone (OXY-IR) 5 MG capsule Take 5 mg by mouth every 4 (four) hours as  needed for pain.    Yes Historical Provider, MD  predniSONE (DELTASONE) 10 MG tablet Take 10 mg by mouth daily with breakfast.   Yes Historical Provider, MD  sevelamer carbonate (RENVELA) 800 MG tablet Take 800-1,600 mg by mouth 3 (three) times daily with meals. 2 tabs three times daily with meals, and 1 tablet with snacks   Yes Historical Provider, MD  warfarin (COUMADIN) 2 MG tablet Take 2 mg by mouth daily. Take with 485m tablet to equal 756m7/12/17  Yes Historical Provider, MD  warfarin (COUMADIN) 5 MG tablet Take 5 mg by mouth daily. Take with 85m55mablet to equal 7mg785m12/17  Yes Historical Provider, MD   Liver Function Tests  Recent Labs Lab 05/19/16 2035  AST 33  ALT 13*  ALKPHOS 120  BILITOT 0.7  PROT 7.4  ALBUMIN 3.2*    Recent Labs Lab 05/19/16 2035  LIPASE 31   CBC  Recent Labs Lab 05/19/16 2035 05/26/16 0116  WBC 5.5  --   HGB 8.5* 9.5*  HCT 27.0* 28.0*  MCV 87.4  --   PLT 182  --    Basic Metabolic Panel  Recent Labs Lab 05/19/16 2035 05/20/16 0608 05/26/16 0116  NA 136 137 140  K 5.7* 6.0* 6.0*  CL 98* 100* 100*  CO2 23 24  --   GLUCOSE 142* 78 95  BUN 59* 65* 48*  CREATININE 14.74* 15.80* 13.90*  CALCIUM 8.2* 7.7*  --      Vitals:   05/26/16 0130 05/26/16 0145 05/26/16 0148 05/26/16 0743  BP: 136/94  147/86 162/86  Pulse: 72 79 74 73  Resp: _0 Temp:      TempSrc:      SpO2: 98% 96% 95% 100%   Exam: Gen chron ill, no distress No rash, cyanosis or  gangrene Sclera anicteric, throat clear  No jvd or bruits Chest clear bilat RRR no MRG Abd soft ntnd no mass or ascites +bs MS no joint effusions or deformity Ext no LE edema / no wounds or ulcers Neuro is alert, Ox 3 , nf R IJ cath/ LUA maturing AVF   Na 140 K 6  BUN 48  Cr 13.90  Assessment: 1. CKD/ ESRD - pt w/o outpatient HD for last month, needs HD.  K is 6.0, will admit and plan HD in the morning tomorrow.  EKG is OK.   2. HTN - cont meds 3. DM2 - not on any medication 4. Hx VTE - Oct 2015, was cath-related subsegmental PE in LUL. Lower ext dopplers were negative and PE was felt to be HD cath-related.  In May 2016 he had acute/ subacute thrombus in the R brachial / basilic veins, prob again cath-related, and was resumed/ restarted on coumadin.  Suspect that all his clotting complications were related directly or indirectly to chronic indwelling HD cath for years.  When AVF is functional and his catheter is out should be able to dc his anticoagulation.   5. Volume - good vol control     Plan - Dialysis in am, then should come to hospital / ED 3 times per week Mon - Sat for inpatient dialysis.  He should ideally get [facility] total [facility]Cr [facility].  DC coumadin after AVF working and HD cath removed.       [facility] Pager [phone] 7PM-7AM, please contact night-coverage [email or web address] Password [email or web address]5/2017,  9:55 AM

## 2016-05-26 NOTE — ED Notes (Addendum)
EMT and Nurse spoke with patient regarding telemetry monitor and transport. Refusing cardiac monitoring during transport and will have upstairs monitor him.

## 2016-05-27 LAB — CBC
HCT: 23.2 % — ABNORMAL LOW (ref 39.0–52.0)
HEMOGLOBIN: 7.6 g/dL — AB (ref 13.0–17.0)
MCH: 28.6 pg (ref 26.0–34.0)
MCHC: 32.8 g/dL (ref 30.0–36.0)
MCV: 87.2 fL (ref 78.0–100.0)
PLATELETS: 200 10*3/uL (ref 150–400)
RBC: 2.66 MIL/uL — AB (ref 4.22–5.81)
RDW: 15.8 % — ABNORMAL HIGH (ref 11.5–15.5)
WBC: 6.1 10*3/uL (ref 4.0–10.5)

## 2016-05-27 LAB — POCT I-STAT, CHEM 8
BUN: 17 mg/dL (ref 6–20)
Calcium, Ion: 1.07 mmol/L — ABNORMAL LOW (ref 1.15–1.40)
Chloride: 96 mmol/L — ABNORMAL LOW (ref 101–111)
Creatinine, Ser: 5.8 mg/dL — ABNORMAL HIGH (ref 0.61–1.24)
Glucose, Bld: 121 mg/dL — ABNORMAL HIGH (ref 65–99)
HEMATOCRIT: 24 % — AB (ref 39.0–52.0)
HEMOGLOBIN: 8.2 g/dL — AB (ref 13.0–17.0)
POTASSIUM: 3.5 mmol/L (ref 3.5–5.1)
SODIUM: 138 mmol/L (ref 135–145)
TCO2: 30 mmol/L (ref 0–100)

## 2016-05-27 LAB — RENAL FUNCTION PANEL
ANION GAP: 16 — AB (ref 5–15)
Albumin: 2.7 g/dL — ABNORMAL LOW (ref 3.5–5.0)
BUN: 55 mg/dL — ABNORMAL HIGH (ref 6–20)
CALCIUM: 8.4 mg/dL — AB (ref 8.9–10.3)
CHLORIDE: 95 mmol/L — AB (ref 101–111)
CO2: 27 mmol/L (ref 22–32)
CREATININE: 15.99 mg/dL — AB (ref 0.61–1.24)
GFR, EST AFRICAN AMERICAN: 3 mL/min — AB (ref >60)
GFR, EST NON AFRICAN AMERICAN: 3 mL/min — AB (ref >60)
Glucose, Bld: 73 mg/dL (ref 65–99)
Phosphorus: 4.2 mg/dL (ref 2.5–4.6)
Potassium: 6.3 mmol/L (ref 3.5–5.1)
SODIUM: 138 mmol/L (ref 135–145)

## 2016-05-27 MED ORDER — HEPARIN SODIUM (PORCINE) 1000 UNIT/ML DIALYSIS
5000.0000 [IU] | INTRAMUSCULAR | Status: DC | PRN
  Filled 2016-05-27: qty 5

## 2016-05-27 NOTE — Progress Notes (Signed)
Patient ready for discharge when he returned from hemodialysis.  Administered morning medications.  Patient did not want to wait from AVS to be completed with medication next dose.  Patient signed discharge instruction copy, took AVS and said he was good that he knew what to do and when to take his medications including medications given today.

## 2016-05-27 NOTE — Procedures (Signed)
Pt seen in dialysis. 4 hour treatment ordered, 400/800 1 K bath for first 1.5 hours pending labs Access is TDC R IJ Has L FA AVF 33 weeks old good bruit and thrill  2-3 kg goal Discharge to home after HD Advised could possibly pursue HD with Dr. Rhona Leavens group in [place].  Jamal Maes, MD [facility] [phone] Pager 05/27/2016, 7:28 AM

## 2016-05-27 NOTE — Discharge Summary (Signed)
Physician Discharge Summary  Patient ID: Frank [name] MRN: [id number] DOB/AGE: 01/29/1966 50 y.o.  Admit date: 05/26/2016 Discharge date: 05/27/2016  Admission Diagnoses:  Discharge Diagnoses:  Active Problems:   Hyperkalemia   Uremia   Anemia of chronic kidney failure   ESRD on hemodialysis ([place])   Essential hypertension   Nonischemic cardiomyopathy (HCC)   Personal history of DVT (deep vein thrombosis)/ PE   Discharged Condition: good  Hospital Course: Frank [name] is a 50 y.o. male with history of DM2, HTN, NICM and ESRD.  He is currently w/o an outpatient hemodialysis unit and is getting "prn" dialysis at inpatient centers including [facility] and [place] to ED asking for dialysis, feeling tired , K 6.0.  Asked to see for dialysis.   Patient admitted, had HD on Monday. [place] home after HD.  No medication changes.   Consults: None  Treatments: dialysis: Hemodialysis  Discharge Exam: Blood pressure (!) 153/84, pulse 85, temperature 97 F (36.1 C), temperature source Oral, resp. rate 15, height _0  (1.88 m), weight 73.5 kg (162 lb 0.6 oz), SpO2 100 %. Gen chron ill, no distress No rash, cyanosis or gangrene Sclera anicteric, throat clear  No jvd or bruits Chest clear bilat RRR no MRG Abd soft ntnd no mass or ascites +bs MS no joint effusions or deformity Ext no LE edema / no wounds or ulcers Neuro is alert, Ox 3 , nf R IJ cath/ LUA maturing AVF  Disposition: 01-Home or Self Care     Medication List    TAKE these medications   allopurinol 100 MG tablet Commonly known as:  ZYLOPRIM Take 100 mg by mouth daily.   amLODipine 10 MG tablet Commonly known as:  NORVASC Take 10 mg by mouth daily.   atorvastatin 40 MG tablet Commonly known as:  LIPITOR Take 1 tablet (40 mg total) by mouth daily at 6 PM.   AURYXIA 1 GM 210 MG(Fe) Tabs Generic drug:  Ferric Citrate Take 420 mg by mouth 3 (three) times daily.   carvedilol 25 MG tablet Commonly known as:   COREG Take 25 mg by mouth 2 (two) times daily with a meal.   cinacalcet 30 MG tablet Commonly known as:  SENSIPAR Take 30 mg by mouth daily.   cloNIDine 0.3 mg/24hr patch Commonly known as:  CATAPRES - Dosed in mg/24 hr Place 0.3 mg onto the skin once a week.   doxazosin 2 MG tablet Commonly known as:  CARDURA Take 2 mg by mouth daily.   famotidine 20 MG tablet Commonly known as:  PEPCID Take 1 tablet (20 mg total) by mouth 2 (two) times daily.   gabapentin 100 MG capsule Commonly known as:  NEURONTIN Take 1 capsule (100 mg total) by mouth at bedtime.   hydrALAZINE 100 MG tablet Commonly known as:  APRESOLINE Take 100 mg by mouth every 8 (eight) hours.   isosorbide mononitrate 60 MG 24 hr tablet Commonly known as:  IMDUR Take 1 tablet (60 mg total) by mouth daily.   omeprazole 20 MG capsule Commonly known as:  PRILOSEC Take 20 mg by mouth daily.   ondansetron 4 MG disintegrating tablet Commonly known as:  ZOFRAN ODT Take 1 tablet (4 mg total) by mouth every 8 (eight) hours as needed for nausea or vomiting.   oxycodone 5 MG capsule Commonly known as:  OXY-IR Take 5 mg by mouth every 4 (four) hours as needed for pain.   predniSONE 10 MG tablet Commonly known  as:  DELTASONE Take 10 mg by mouth daily with breakfast.   sevelamer carbonate 800 MG tablet Commonly known as:  RENVELA Take 800-1,600 mg by mouth 3 (three) times daily with meals. 2 tabs three times daily with meals, and 1 tablet with snacks   warfarin 2 MG tablet Commonly known as:  COUMADIN Take 2 mg by mouth daily. Take with 26m tablet to equal 746m  warfarin 5 MG tablet Commonly known as:  COUMADIN Take 5 mg by mouth daily. Take with 56m58mablet to equal 7mg56m   Follow-up Information    JEGEDE, OLUGBEMIGA, MD. Schedule an appointment as soon as possible for a visit in 3 day(s).   Specialty:  Internal Medicine Why:  for close follow up of your back pain Contact information: [phone]       Signed: SCHESol Blazing6/2017, 8:20 AM

## 2016-05-28 ENCOUNTER — Inpatient Hospital Stay (HOSPITAL_COMMUNITY)
Admission: EM | Admit: 2016-05-28 | Discharge: 2016-05-30 | DRG: 640 | Disposition: A | Discharge status: Home / Self Care | Payer: Medicare Other | Attending: Internal Medicine | Admitting: Internal Medicine

## 2016-05-28 ENCOUNTER — Encounter (HOSPITAL_COMMUNITY): Payer: Self-pay

## 2016-05-28 DIAGNOSIS — I4581 Long QT syndrome: Secondary | ICD-10-CM | POA: Diagnosis not present

## 2016-05-28 DIAGNOSIS — I5042 Chronic combined systolic (congestive) and diastolic (congestive) heart failure: Secondary | ICD-10-CM | POA: Diagnosis present

## 2016-05-28 DIAGNOSIS — E1122 Type 2 diabetes mellitus with diabetic chronic kidney disease: Secondary | ICD-10-CM | POA: Diagnosis not present

## 2016-05-28 DIAGNOSIS — N186 End stage renal disease: Secondary | ICD-10-CM | POA: Diagnosis present

## 2016-05-28 DIAGNOSIS — I10 Essential (primary) hypertension: Secondary | ICD-10-CM | POA: Diagnosis present

## 2016-05-28 DIAGNOSIS — Z888 Allergy status to other drugs, medicaments and biological substances status: Secondary | ICD-10-CM

## 2016-05-28 DIAGNOSIS — Z87891 Personal history of nicotine dependence: Secondary | ICD-10-CM

## 2016-05-28 DIAGNOSIS — Z8249 Family history of ischemic heart disease and other diseases of the circulatory system: Secondary | ICD-10-CM

## 2016-05-28 DIAGNOSIS — I429 Cardiomyopathy, unspecified: Secondary | ICD-10-CM | POA: Diagnosis not present

## 2016-05-28 DIAGNOSIS — N2889 Other specified disorders of kidney and ureter: Secondary | ICD-10-CM | POA: Diagnosis present

## 2016-05-28 DIAGNOSIS — E1159 Type 2 diabetes mellitus with other circulatory complications: Secondary | ICD-10-CM | POA: Diagnosis present

## 2016-05-28 DIAGNOSIS — Z7952 Long term (current) use of systemic steroids: Secondary | ICD-10-CM

## 2016-05-28 DIAGNOSIS — I132 Hypertensive heart and chronic kidney disease with heart failure and with stage 5 chronic kidney disease, or end stage renal disease: Secondary | ICD-10-CM | POA: Diagnosis present

## 2016-05-28 DIAGNOSIS — Z7901 Long term (current) use of anticoagulants: Secondary | ICD-10-CM

## 2016-05-28 DIAGNOSIS — Z86718 Personal history of other venous thrombosis and embolism: Secondary | ICD-10-CM

## 2016-05-28 DIAGNOSIS — D631 Anemia in chronic kidney disease: Secondary | ICD-10-CM | POA: Diagnosis present

## 2016-05-28 DIAGNOSIS — Z86711 Personal history of pulmonary embolism: Secondary | ICD-10-CM

## 2016-05-28 DIAGNOSIS — Z885 Allergy status to narcotic agent status: Secondary | ICD-10-CM

## 2016-05-28 DIAGNOSIS — G8929 Other chronic pain: Secondary | ICD-10-CM | POA: Diagnosis present

## 2016-05-28 DIAGNOSIS — Z992 Dependence on renal dialysis: Secondary | ICD-10-CM

## 2016-05-28 DIAGNOSIS — R112 Nausea with vomiting, unspecified: Secondary | ICD-10-CM | POA: Diagnosis not present

## 2016-05-28 DIAGNOSIS — E875 Hyperkalemia: Secondary | ICD-10-CM | POA: Diagnosis not present

## 2016-05-28 DIAGNOSIS — Z79899 Other long term (current) drug therapy: Secondary | ICD-10-CM

## 2016-05-28 DIAGNOSIS — I120 Hypertensive chronic kidney disease with stage 5 chronic kidney disease or end stage renal disease: Secondary | ICD-10-CM | POA: Diagnosis not present

## 2016-05-28 DIAGNOSIS — N189 Chronic kidney disease, unspecified: Secondary | ICD-10-CM

## 2016-05-28 DIAGNOSIS — E785 Hyperlipidemia, unspecified: Secondary | ICD-10-CM | POA: Diagnosis present

## 2016-05-28 DIAGNOSIS — K219 Gastro-esophageal reflux disease without esophagitis: Secondary | ICD-10-CM | POA: Diagnosis present

## 2016-05-28 DIAGNOSIS — M545 Low back pain: Secondary | ICD-10-CM | POA: Diagnosis present

## 2016-05-28 LAB — COMPREHENSIVE METABOLIC PANEL
ALK PHOS: 126 U/L (ref 38–126)
ALT: 14 U/L — AB (ref 17–63)
AST: 30 U/L (ref 15–41)
Albumin: 3.1 g/dL — ABNORMAL LOW (ref 3.5–5.0)
Anion gap: 13 (ref 5–15)
BUN: 37 mg/dL — AB (ref 6–20)
CHLORIDE: 102 mmol/L (ref 101–111)
CO2: 25 mmol/L (ref 22–32)
CREATININE: 11.78 mg/dL — AB (ref 0.61–1.24)
Calcium: 7.9 mg/dL — ABNORMAL LOW (ref 8.9–10.3)
GFR calc Af Amer: 5 mL/min — ABNORMAL LOW (ref >60)
GFR, EST NON AFRICAN AMERICAN: 4 mL/min — AB (ref >60)
Glucose, Bld: 119 mg/dL — ABNORMAL HIGH (ref 65–99)
Potassium: 6.2 mmol/L — ABNORMAL HIGH (ref 3.5–5.1)
SODIUM: 140 mmol/L (ref 135–145)
Total Bilirubin: 0.4 mg/dL (ref 0.3–1.2)
Total Protein: 6.9 g/dL (ref 6.5–8.1)

## 2016-05-28 LAB — CBC WITH DIFFERENTIAL/PLATELET
BASOS ABS: 0 10*3/uL (ref 0.0–0.1)
Basophils Relative: 0 %
EOS PCT: 6 %
Eosinophils Absolute: 0.3 10*3/uL (ref 0.0–0.7)
HCT: 25.2 % — ABNORMAL LOW (ref 39.0–52.0)
HEMOGLOBIN: 7.8 g/dL — AB (ref 13.0–17.0)
LYMPHS PCT: 19 %
Lymphs Abs: 1 10*3/uL (ref 0.7–4.0)
MCH: 27.9 pg (ref 26.0–34.0)
MCHC: 31 g/dL (ref 30.0–36.0)
MCV: 90 fL (ref 78.0–100.0)
Monocytes Absolute: 0.6 10*3/uL (ref 0.1–1.0)
Monocytes Relative: 11 %
NEUTROS PCT: 64 %
Neutro Abs: 3.5 10*3/uL (ref 1.7–7.7)
PLATELETS: 187 10*3/uL (ref 150–400)
RBC: 2.8 MIL/uL — AB (ref 4.22–5.81)
RDW: 16.2 % — ABNORMAL HIGH (ref 11.5–15.5)
WBC: 5.4 10*3/uL (ref 4.0–10.5)

## 2016-05-28 LAB — I-STAT CHEM 8, ED
BUN: 38 mg/dL — AB (ref 6–20)
CHLORIDE: 102 mmol/L (ref 101–111)
CREATININE: 11.7 mg/dL — AB (ref 0.61–1.24)
Calcium, Ion: 0.92 mmol/L — ABNORMAL LOW (ref 1.15–1.40)
Glucose, Bld: 111 mg/dL — ABNORMAL HIGH (ref 65–99)
HEMATOCRIT: 27 % — AB (ref 39.0–52.0)
Hemoglobin: 9.2 g/dL — ABNORMAL LOW (ref 13.0–17.0)
POTASSIUM: 6.1 mmol/L — AB (ref 3.5–5.1)
SODIUM: 141 mmol/L (ref 135–145)
TCO2: 26 mmol/L (ref 0–100)

## 2016-05-28 LAB — PROTIME-INR
INR: 1.16
Prothrombin Time: 14.9 seconds (ref 11.4–15.2)

## 2016-05-28 MED ORDER — SODIUM POLYSTYRENE SULFONATE 15 GM/60ML PO SUSP
45.0000 g | Freq: Once | ORAL | Status: AC
  Administered 2016-05-28: 45 g via ORAL
  Filled 2016-05-28: qty 180

## 2016-05-28 MED ORDER — SODIUM BICARBONATE 8.4 % IV SOLN
50.0000 meq | Freq: Once | INTRAVENOUS | Status: AC
  Administered 2016-05-29: 50 meq via INTRAVENOUS
  Filled 2016-05-28: qty 50

## 2016-05-28 MED ORDER — OXYCODONE-ACETAMINOPHEN 5-325 MG PO TABS
1.0000 | ORAL_TABLET | Freq: Once | ORAL | Status: AC
  Administered 2016-05-28: 1 via ORAL
  Filled 2016-05-28: qty 1

## 2016-05-28 NOTE — ED Provider Notes (Signed)
[facility] DEPT Provider Note   CSN: [id number] Arrival date & time: 05/28/16  1931  By signing my name below, I, Frank [name], attest that this documentation has been prepared under the direction and in the presence of Frank Essex, MD. Electronically Signed: Judithann [name], ED Scribe. 05/28/16. 11:15 PM.    History   Chief Complaint Chief Complaint  Patient presents with  . Emesis  . Vascular Access Problem   HPI Comments: Frank [name] is a 50 y.o. male with a hx of anemia of chronic kidney failure, ESRD on hemodialysis (17 years Tu/Th/Sat), and chronic lower back pain who presents to the Emergency Department complaining of gradual onset, persistent moderate shortness of breath onset this morning. He reports associated multiple episodes of non-bloody vomiting (4 episodes today) which he states is normal for him after dialysis; his last dialysis treatment was 2 days ago here. He states that he is also experiencing an acute exacerbation of his chronic bilateral lower back pain. No alleviating factors noted. He states that he has tried Percocet for the back pain but he vomited it immediately. Pt has an allergy to Butalbital-apap-caffeine, ferrlecit, minoxidil, and darvocet. He states that he has been complaint with his Coumadin. He denies any IV drug use. He denies any fever, chest pain, headache, cough, abdominal pain, constipation, diarrhea, bowel/bladder incontinence, urinary symptoms, or any other symptoms.   The history is provided by the patient. No language interpreter was used.    Past Medical History:  Diagnosis Date  . Anemia   . Anxiety   . Chronic combined systolic and diastolic CHF (congestive heart failure) (HCC)    a. EF 20-25% by echo in 08/2015 b. echo 10/2015: EF 35-40%, diffuse HK, severe LAE, moderate RAE, small pericardial effusion  . Complication of anesthesia    itching, sore throat  . Depression   . Dialysis patient ([place])   . ESRD (end stage renal  disease) ([place])    due to HTN per patient, followed at [facility], s/p failed kidney transplant - dialysis Tue, Th, Sat  . Hyperkalemia 12/2015  . Hypertension   . Junctional rhythm    a. noted in 08/2015: hyperkalemic at that time  b. 12/2015: presented in junctional rhythm w/ K+ of 6.6. Resolved with improvement of K+ levels.  . Nonischemic cardiomyopathy ([place])    a. 08/2014: cath showing minimal CAD, but tortuous arteries noted.   . Personal history of DVT (deep vein thrombosis)/ PE 05/26/2016   In Oct 2015 had small subsemental LUL PE w/o DVT (LE dopplers neg) and was felt to be HD cath related, treated w coumadin.  IN May 2016 had small vein DVT (acute/subacute) in the R basilic/ brachial veins of the RUE, resumed on coumadin.  Had R sided HD cath at that time.    . Renal insufficiency   . Shortness of breath   . Type II diabetes mellitus (HCC)    No history per patient, but remains under history as A1c would not be accurate given on dialysis    Patient Active Problem List   Diagnosis Date Noted  . Personal history of DVT (deep vein thrombosis)/ PE 05/26/2016  . Nonischemic cardiomyopathy ([place]) 01/09/2016  . Bilateral low back pain without sciatica   . Left renal mass 10/30/2015  . Constipation 10/30/2015  . Adjustment disorder with mixed anxiety and depressed mood 08/20/2015  . Chronic epididymitis 06/19/2015  . Groin pain, chronic, right 06/19/2015  . Incarcerated right inguinal hernia 02/16/2015  . Essential hypertension  01/02/2015  . Dyslipidemia   . ESRD on hemodialysis ([place])   . Anemia of chronic kidney failure 06/24/2013    Past Surgical History:  Procedure Laterality Date  . CAPD INSERTION    . CAPD REMOVAL    . INGUINAL HERNIA REPAIR Right 02/14/2015   Procedure: REPAIR INCARCERATED RIGHT INGUINAL HERNIA;  Surgeon: [name], MD;  Location: [facility];  Service: General;  Laterality: Right;  . INSERTION OF DIALYSIS CATHETER Right 09/23/2015   Procedure: exchange of Right  internal Dialysis Catheter.;  Surgeon: [name], MD;  Location: [facility];  Service: Vascular;  Laterality: Right;  . KIDNEY RECEIPIENT  2006   failed and started HD in March 2014  . LEFT HEART CATHETERIZATION WITH CORONARY ANGIOGRAM N/A 09/02/2014   Procedure: LEFT HEART CATHETERIZATION WITH CORONARY ANGIOGRAM;  Surgeon: [name], MD;  Location: [facility] CATH LAB;  Service: Cardiovascular;  Laterality: N/A;       Home Medications    Prior to Admission medications   Medication Sig Start Date End Date Taking? Authorizing Provider  allopurinol (ZYLOPRIM) 100 MG tablet Take 100 mg by mouth daily.    Historical Provider, MD  amLODipine (NORVASC) 10 MG tablet Take 10 mg by mouth daily.    Historical Provider, MD  atorvastatin (LIPITOR) 40 MG tablet Take 1 tablet (40 mg total) by mouth daily at 6 PM. 09/02/14   Barton Dubois, MD  AURYXIA 1 GM 210 MG(Fe) TABS Take 420 mg by mouth 3 (three) times daily. 01/26/16   Historical Provider, MD  carvedilol (COREG) 25 MG tablet Take 25 mg by mouth 2 (two) times daily with a meal.    Historical Provider, MD  cinacalcet (SENSIPAR) 30 MG tablet Take 30 mg by mouth daily.    Historical Provider, MD  cloNIDine (CATAPRES - DOSED IN MG/24 HR) 0.3 mg/24hr patch Place 0.3 mg onto the skin once a week.    Historical Provider, MD  doxazosin (CARDURA) 2 MG tablet Take 2 mg by mouth daily.    Historical Provider, MD  famotidine (PEPCID) 20 MG tablet Take 1 tablet (20 mg total) by mouth 2 (two) times daily. 02/13/16   Frank Shanks, MD  gabapentin (NEURONTIN) 100 MG capsule Take 1 capsule (100 mg total) by mouth at bedtime. 05/21/16   [name], DO  hydrALAZINE (APRESOLINE) 100 MG tablet Take 100 mg by mouth every 8 (eight) hours.    Historical Provider, MD  isosorbide mononitrate (IMDUR) 60 MG 24 hr tablet Take 1 tablet (60 mg total) by mouth daily. 01/09/16   Smiley Houseman, MD  omeprazole (PRILOSEC) 20 MG capsule Take 20 mg by mouth daily. 07/01/13    Historical Provider, MD  ondansetron (ZOFRAN ODT) 4 MG disintegrating tablet Take 1 tablet (4 mg total) by mouth every 8 (eight) hours as needed for nausea or vomiting. 05/16/16   Sherwood Gambler, MD  oxycodone (OXY-IR) 5 MG capsule Take 5 mg by mouth every 4 (four) hours as needed for pain.     Historical Provider, MD  predniSONE (DELTASONE) 10 MG tablet Take 10 mg by mouth daily with breakfast.    Historical Provider, MD  sevelamer carbonate (RENVELA) 800 MG tablet Take 800-1,600 mg by mouth 3 (three) times daily with meals. 2 tabs three times daily with meals, and 1 tablet with snacks    Historical Provider, MD  warfarin (COUMADIN) 2 MG tablet Take 2 mg by mouth daily. Take with 43m tablet to equal 725m7/12/17   Historical  Provider, MD  warfarin (COUMADIN) 5 MG tablet Take 5 mg by mouth daily. Take with 20m tablet to equal 724m7/12/17   Historical Provider, MD    Family History Family History  Problem Relation Age of Onset  . Hypertension Other     Social History Social History  Substance Use Topics  . Smoking status: Former Smoker    Packs/day: 0.00    Years: 1.00    Types: Cigarettes  . Smokeless tobacco: Never Used     Comment: quit Jan 2014  . Alcohol use No     Allergies   Butalbital-apap-caffeine; Ferrlecit [na ferric gluc cplx in sucrose]; Minoxidil; Darvocet [propoxyphene n-acetaminophen]; and Other   Review of Systems Review of Systems  A complete 10 system review of systems was obtained and all systems are negative except as noted in the HPI and PMH.    Physical Exam Updated Vital Signs BP 178/98 (BP Location: Right Arm)   Pulse 62   Temp 97.7 F (36.5 C) (Oral)   Resp 14   Ht _0  (1.88 m)   Wt 161 lb (73 kg)   SpO2 100%   BMI 20.67 kg/m   Physical Exam  Constitutional: He is oriented to person, place, and time. He appears well-developed and well-nourished. No distress.  HENT:  Head: Normocephalic and atraumatic.  Mouth/Throat: Oropharynx is clear  and moist. No oropharyngeal exudate.  Eyes: Conjunctivae and EOM are normal. Pupils are equal, round, and reactive to light.  Neck: Normal range of motion. Neck supple.  No meningismus.  Cardiovascular: Normal rate, regular rhythm, normal heart sounds and intact distal pulses.   No murmur heard. Pulmonary/Chest: Effort normal and breath sounds normal. No respiratory distress.  Dialysis catheter right chest  Abdominal: Soft. There is no tenderness. There is no rebound and no guarding.  Musculoskeletal: Normal range of motion. He exhibits tenderness. He exhibits no edema.  Paraspinal lumbar tenderness Intact DP pulses  Neurological: He is alert and oriented to person, place, and time. No cranial nerve deficit. He exhibits normal muscle tone. Coordination normal.   5/5 strength throughout. CN 2-12 intact.Equal grip strength.   Skin: Skin is warm.  Psychiatric: He has a normal mood and affect. His behavior is normal.  Nursing note and vitals reviewed.    ED Treatments / Results  DIAGNOSTIC STUDIES: Oxygen Saturation is 100% on RA, normal by my interpretation.    COORDINATION OF CARE: 11:13 PM- Pt advised of plan for treatment and pt agrees. Pt will receive lab work and EKG for further evaluation.    Labs (all labs ordered are listed, but only abnormal results are displayed) Labs Reviewed  CBC WITH DIFFERENTIAL/PLATELET - Abnormal; Notable for the following:       Result Value   RBC 2.80 (*)    Hemoglobin 7.8 (*)    HCT 25.2 (*)    RDW 16.2 (*)    All other components within normal limits  COMPREHENSIVE METABOLIC PANEL - Abnormal; Notable for the following:    Potassium 6.2 (*)    Glucose, Bld 119 (*)    BUN 37 (*)    Creatinine, Ser 11.78 (*)    Calcium 7.9 (*)    Albumin 3.1 (*)    ALT 14 (*)    GFR calc non Af Amer 4 (*)    GFR calc Af Amer 5 (*)    All other components within normal limits  I-STAT CHEM 8, ED - Abnormal; Notable for the following:  Potassium 6.1 (*)      BUN 38 (*)    Creatinine, Ser 11.70 (*)    Glucose, Bld 111 (*)    Calcium, Ion 0.92 (*)    Hemoglobin 9.2 (*)    HCT 27.0 (*)    All other components within normal limits  PROTIME-INR    EKG  EKG Interpretation  Date/Time:  Tuesday May 28 2016 23:05:58 EST Ventricular Rate:  69 PR Interval:    QRS Duration: 90 QT Interval:  460 QTC Calculation: 493 R Axis:   -11 Text Interpretation:  Sinus rhythm LVH with secondary repolarization abnormality Borderline prolonged QT interval No significant change was found Confirmed by Wyvonnia Dusky  MD, Brydan Downard [phone]) on 05/28/2016 11:13:54 PM       Radiology No results found.  Procedures Procedures (including critical care time)  Medications Ordered in ED Medications - No data to display   Initial Impression / Assessment and Plan / ED Course  Frank Essex, MD has reviewed the triage vital signs and the nursing notes.  Pertinent labs & imaging results that were available during my care of the patient were reviewed by me and considered in my medical decision making (see chart for details).  Clinical Course   Patient presents with requesting dialysis. Last session 2 days ago. Has chronic back pain which is unchanged. No neuro deficits. Reports some shortness of breath. Denies chest pain, fever. 2 episodes of vomiting today is typical for him when he needs dialysis. EKG shows T-wave peaked changes without acute change.  D/w Dr. Florene Glen of nephrology. Recommend by mouth Kayexalate only for hyperkalemia. He will plan on dialysis in the morning.  Patient refusing chest Xray.  IV calcium and bicarb given.  Admission d/w Dr. Tamala Julian.  CRITICAL CARE Performed by: Frank [name] Total critical care time: 35 minutes Critical care time was exclusive of separately billable procedures and treating other patients. Critical care was necessary to treat or prevent imminent or life-threatening deterioration. Critical care was time spent  personally by me on the following activities: development of treatment plan with patient and/or surrogate as well as nursing, discussions with consultants, evaluation of patient's response to treatment, examination of patient, obtaining history from patient or surrogate, ordering and performing treatments and interventions, ordering and review of laboratory studies, ordering and review of radiographic studies, pulse oximetry and re-evaluation of patient's condition.   Final Clinical Impressions(s) / ED Diagnoses   Final diagnoses:  Hyperkalemia  ESRD (end stage renal disease) ([place])    New Prescriptions New Prescriptions   No medications on file   I personally performed the services described in this documentation, which was scribed in my presence. The recorded information has been reviewed and is accurate.    Frank Essex, MD 05/29/16 [phone]

## 2016-05-28 NOTE — ED Triage Notes (Signed)
Pt states he is here for dialysis and emesis; pt states last tx on this past Sunday; Pt c/o chronic back pian on arrival at 7/10; Pt a&ox 4 on arrival. Pt states he had emesis x 4 times today.

## 2016-05-29 DIAGNOSIS — Z885 Allergy status to narcotic agent status: Secondary | ICD-10-CM | POA: Diagnosis not present

## 2016-05-29 DIAGNOSIS — Z992 Dependence on renal dialysis: Secondary | ICD-10-CM

## 2016-05-29 DIAGNOSIS — I4581 Long QT syndrome: Secondary | ICD-10-CM | POA: Diagnosis present

## 2016-05-29 DIAGNOSIS — E875 Hyperkalemia: Secondary | ICD-10-CM | POA: Diagnosis not present

## 2016-05-29 DIAGNOSIS — I132 Hypertensive heart and chronic kidney disease with heart failure and with stage 5 chronic kidney disease, or end stage renal disease: Secondary | ICD-10-CM | POA: Diagnosis present

## 2016-05-29 DIAGNOSIS — D631 Anemia in chronic kidney disease: Secondary | ICD-10-CM

## 2016-05-29 DIAGNOSIS — R531 Weakness: Secondary | ICD-10-CM | POA: Diagnosis present

## 2016-05-29 DIAGNOSIS — I5042 Chronic combined systolic (congestive) and diastolic (congestive) heart failure: Secondary | ICD-10-CM | POA: Diagnosis present

## 2016-05-29 DIAGNOSIS — K219 Gastro-esophageal reflux disease without esophagitis: Secondary | ICD-10-CM | POA: Diagnosis present

## 2016-05-29 DIAGNOSIS — N186 End stage renal disease: Secondary | ICD-10-CM | POA: Diagnosis not present

## 2016-05-29 DIAGNOSIS — N2889 Other specified disorders of kidney and ureter: Secondary | ICD-10-CM | POA: Diagnosis present

## 2016-05-29 DIAGNOSIS — M545 Low back pain: Secondary | ICD-10-CM

## 2016-05-29 DIAGNOSIS — E1122 Type 2 diabetes mellitus with diabetic chronic kidney disease: Secondary | ICD-10-CM | POA: Diagnosis present

## 2016-05-29 DIAGNOSIS — D649 Anemia, unspecified: Secondary | ICD-10-CM | POA: Diagnosis not present

## 2016-05-29 DIAGNOSIS — I2510 Atherosclerotic heart disease of native coronary artery without angina pectoris: Secondary | ICD-10-CM | POA: Diagnosis not present

## 2016-05-29 DIAGNOSIS — Z86718 Personal history of other venous thrombosis and embolism: Secondary | ICD-10-CM | POA: Diagnosis not present

## 2016-05-29 DIAGNOSIS — G8929 Other chronic pain: Secondary | ICD-10-CM | POA: Diagnosis present

## 2016-05-29 DIAGNOSIS — Z7901 Long term (current) use of anticoagulants: Secondary | ICD-10-CM | POA: Diagnosis not present

## 2016-05-29 DIAGNOSIS — R202 Paresthesia of skin: Secondary | ICD-10-CM | POA: Diagnosis not present

## 2016-05-29 DIAGNOSIS — T8612 Kidney transplant failure: Secondary | ICD-10-CM | POA: Diagnosis not present

## 2016-05-29 DIAGNOSIS — N185 Chronic kidney disease, stage 5: Secondary | ICD-10-CM | POA: Diagnosis not present

## 2016-05-29 DIAGNOSIS — Z8249 Family history of ischemic heart disease and other diseases of the circulatory system: Secondary | ICD-10-CM | POA: Diagnosis not present

## 2016-05-29 DIAGNOSIS — Z888 Allergy status to other drugs, medicaments and biological substances status: Secondary | ICD-10-CM | POA: Diagnosis not present

## 2016-05-29 DIAGNOSIS — I428 Other cardiomyopathies: Secondary | ICD-10-CM | POA: Diagnosis not present

## 2016-05-29 DIAGNOSIS — Z86711 Personal history of pulmonary embolism: Secondary | ICD-10-CM | POA: Diagnosis not present

## 2016-05-29 DIAGNOSIS — Z7952 Long term (current) use of systemic steroids: Secondary | ICD-10-CM | POA: Diagnosis not present

## 2016-05-29 DIAGNOSIS — Z87891 Personal history of nicotine dependence: Secondary | ICD-10-CM | POA: Diagnosis not present

## 2016-05-29 DIAGNOSIS — R200 Anesthesia of skin: Secondary | ICD-10-CM | POA: Diagnosis not present

## 2016-05-29 DIAGNOSIS — I429 Cardiomyopathy, unspecified: Secondary | ICD-10-CM | POA: Diagnosis present

## 2016-05-29 DIAGNOSIS — R112 Nausea with vomiting, unspecified: Secondary | ICD-10-CM | POA: Diagnosis not present

## 2016-05-29 DIAGNOSIS — Y830 Surgical operation with transplant of whole organ as the cause of abnormal reaction of the patient, or of later complication, without mention of misadventure at the time of the procedure: Secondary | ICD-10-CM | POA: Diagnosis not present

## 2016-05-29 DIAGNOSIS — Z79899 Other long term (current) drug therapy: Secondary | ICD-10-CM | POA: Diagnosis not present

## 2016-05-29 DIAGNOSIS — I10 Essential (primary) hypertension: Secondary | ICD-10-CM

## 2016-05-29 DIAGNOSIS — R001 Bradycardia, unspecified: Secondary | ICD-10-CM | POA: Diagnosis not present

## 2016-05-29 DIAGNOSIS — E785 Hyperlipidemia, unspecified: Secondary | ICD-10-CM | POA: Diagnosis present

## 2016-05-29 LAB — IRON AND TIBC
IRON: 41 ug/dL — AB (ref 45–182)
SATURATION RATIOS: 22 % (ref 17.9–39.5)
TIBC: 190 ug/dL — ABNORMAL LOW (ref 250–450)
UIBC: 149 ug/dL

## 2016-05-29 LAB — RENAL FUNCTION PANEL
Albumin: 3 g/dL — ABNORMAL LOW (ref 3.5–5.0)
Anion gap: 11 (ref 5–15)
BUN: 44 mg/dL — ABNORMAL HIGH (ref 6–20)
CHLORIDE: 100 mmol/L — AB (ref 101–111)
CO2: 27 mmol/L (ref 22–32)
Calcium: 8.1 mg/dL — ABNORMAL LOW (ref 8.9–10.3)
Creatinine, Ser: 12.59 mg/dL — ABNORMAL HIGH (ref 0.61–1.24)
GFR, EST AFRICAN AMERICAN: 5 mL/min — AB (ref >60)
GFR, EST NON AFRICAN AMERICAN: 4 mL/min — AB (ref >60)
Glucose, Bld: 129 mg/dL — ABNORMAL HIGH (ref 65–99)
POTASSIUM: 6.9 mmol/L — AB (ref 3.5–5.1)
Phosphorus: 4.1 mg/dL (ref 2.5–4.6)
Sodium: 138 mmol/L (ref 135–145)

## 2016-05-29 MED ORDER — ATORVASTATIN CALCIUM 40 MG PO TABS
40.0000 mg | ORAL_TABLET | Freq: Every day | ORAL | Status: DC
  Administered 2016-05-29: 40 mg via ORAL
  Filled 2016-05-29: qty 1

## 2016-05-29 MED ORDER — SEVELAMER CARBONATE 800 MG PO TABS
1600.0000 mg | ORAL_TABLET | Freq: Three times a day (TID) | ORAL | Status: DC
  Administered 2016-05-29 – 2016-05-30 (×5): 1600 mg via ORAL
  Filled 2016-05-29 (×6): qty 2

## 2016-05-29 MED ORDER — SEVELAMER CARBONATE 800 MG PO TABS: 800.0000 mg | ORAL_TABLET | ORAL | Status: DC | PRN

## 2016-05-29 MED ORDER — ONDANSETRON 4 MG PO TBDP: 4.0000 mg | ORAL_TABLET | Freq: Three times a day (TID) | ORAL | Status: DC | PRN

## 2016-05-29 MED ORDER — GABAPENTIN 100 MG PO CAPS
100.0000 mg | ORAL_CAPSULE | Freq: Every day | ORAL | Status: DC
  Administered 2016-05-29: 100 mg via ORAL
  Filled 2016-05-29: qty 1

## 2016-05-29 MED ORDER — SODIUM CHLORIDE 0.9 % IV SOLN
1.0000 g | Freq: Once | INTRAVENOUS | Status: AC
  Administered 2016-05-29: 1 g via INTRAVENOUS
  Filled 2016-05-29: qty 10

## 2016-05-29 MED ORDER — PANTOPRAZOLE SODIUM 40 MG PO TBEC
40.0000 mg | DELAYED_RELEASE_TABLET | Freq: Every day | ORAL | Status: DC
  Administered 2016-05-29 – 2016-05-30 (×2): 40 mg via ORAL
  Filled 2016-05-29 (×2): qty 1

## 2016-05-29 MED ORDER — CLONIDINE HCL 0.3 MG/24HR TD PTWK
0.3000 mg | MEDICATED_PATCH | TRANSDERMAL | Status: DC
  Filled 2016-05-29: qty 1

## 2016-05-29 MED ORDER — AMLODIPINE BESYLATE 10 MG PO TABS
10.0000 mg | ORAL_TABLET | Freq: Every day | ORAL | Status: DC
  Administered 2016-05-29 – 2016-05-30 (×2): 10 mg via ORAL
  Filled 2016-05-29 (×2): qty 1

## 2016-05-29 MED ORDER — CARVEDILOL 25 MG PO TABS
25.0000 mg | ORAL_TABLET | Freq: Two times a day (BID) | ORAL | Status: DC
  Administered 2016-05-29 – 2016-05-30 (×3): 25 mg via ORAL
  Filled 2016-05-29 (×3): qty 1

## 2016-05-29 MED ORDER — DARBEPOETIN ALFA 200 MCG/0.4ML IJ SOSY
200.0000 ug | PREFILLED_SYRINGE | INTRAMUSCULAR | Status: DC
  Administered 2016-05-29: 200 ug via INTRAVENOUS

## 2016-05-29 MED ORDER — PREDNISONE 10 MG PO TABS
10.0000 mg | ORAL_TABLET | Freq: Every day | ORAL | Status: DC
  Administered 2016-05-29 – 2016-05-30 (×2): 10 mg via ORAL
  Filled 2016-05-29 (×2): qty 1

## 2016-05-29 MED ORDER — OXYCODONE HCL 5 MG PO TABS
5.0000 mg | ORAL_TABLET | ORAL | Status: DC | PRN
  Administered 2016-05-29 – 2016-05-30 (×6): 5 mg via ORAL
  Filled 2016-05-29 (×7): qty 1

## 2016-05-29 MED ORDER — WARFARIN SODIUM 3 MG PO TABS
3.0000 mg | ORAL_TABLET | Freq: Once | ORAL | Status: AC
  Administered 2016-05-29: 3 mg via ORAL
  Filled 2016-05-29: qty 1

## 2016-05-29 MED ORDER — FAMOTIDINE 20 MG PO TABS
20.0000 mg | ORAL_TABLET | Freq: Two times a day (BID) | ORAL | Status: DC
  Administered 2016-05-29 – 2016-05-30 (×3): 20 mg via ORAL
  Filled 2016-05-29 (×3): qty 1

## 2016-05-29 MED ORDER — CINACALCET HCL 30 MG PO TABS
30.0000 mg | ORAL_TABLET | Freq: Every day | ORAL | Status: DC
  Administered 2016-05-29 – 2016-05-30 (×2): 30 mg via ORAL
  Filled 2016-05-29 (×2): qty 1

## 2016-05-29 MED ORDER — FERRIC CITRATE 1 GM 210 MG(FE) PO TABS: 420.0000 mg | ORAL_TABLET | Freq: Three times a day (TID) | ORAL | Status: DC

## 2016-05-29 MED ORDER — ALLOPURINOL 100 MG PO TABS
100.0000 mg | ORAL_TABLET | Freq: Every day | ORAL | Status: DC
  Administered 2016-05-29 – 2016-05-30 (×2): 100 mg via ORAL
  Filled 2016-05-29 (×2): qty 1

## 2016-05-29 MED ORDER — ONDANSETRON HCL 4 MG/2ML IJ SOLN: 4.0000 mg | Freq: Four times a day (QID) | INTRAMUSCULAR | Status: DC | PRN

## 2016-05-29 MED ORDER — HEPARIN SODIUM (PORCINE) 5000 UNIT/ML IJ SOLN
5000.0000 [IU] | Freq: Three times a day (TID) | INTRAMUSCULAR | Status: DC
  Filled 2016-05-29: qty 1

## 2016-05-29 MED ORDER — OXYCODONE HCL 5 MG PO TABS
ORAL_TABLET | ORAL | Status: AC
  Filled 2016-05-29: qty 1

## 2016-05-29 MED ORDER — DARBEPOETIN ALFA 200 MCG/0.4ML IJ SOSY
PREFILLED_SYRINGE | INTRAMUSCULAR | Status: AC
  Administered 2016-05-29: 200 ug via INTRAVENOUS
  Filled 2016-05-29: qty 0.4

## 2016-05-29 MED ORDER — HYDRALAZINE HCL 50 MG PO TABS
100.0000 mg | ORAL_TABLET | Freq: Three times a day (TID) | ORAL | Status: DC
  Administered 2016-05-29 – 2016-05-30 (×3): 100 mg via ORAL
  Filled 2016-05-29 (×4): qty 2

## 2016-05-29 MED ORDER — WARFARIN - PHARMACIST DOSING INPATIENT: Freq: Every day | Status: DC

## 2016-05-29 MED ORDER — ISOSORBIDE MONONITRATE ER 60 MG PO TB24
60.0000 mg | ORAL_TABLET | Freq: Every day | ORAL | Status: DC
  Administered 2016-05-29 – 2016-05-30 (×2): 60 mg via ORAL
  Filled 2016-05-29 (×2): qty 1

## 2016-05-29 MED ORDER — ONDANSETRON HCL 4 MG PO TABS: 4.0000 mg | ORAL_TABLET | Freq: Four times a day (QID) | ORAL | Status: DC | PRN

## 2016-05-29 MED ORDER — DOXAZOSIN MESYLATE 2 MG PO TABS
2.0000 mg | ORAL_TABLET | Freq: Every day | ORAL | Status: DC
  Administered 2016-05-29 – 2016-05-30 (×2): 2 mg via ORAL
  Filled 2016-05-29 (×2): qty 1

## 2016-05-29 MED ORDER — WARFARIN SODIUM 6 MG PO TABS
7.0000 mg | ORAL_TABLET | Freq: Once | ORAL | Status: AC
  Administered 2016-05-29: 7 mg via ORAL
  Filled 2016-05-29: qty 1

## 2016-05-29 MED ORDER — ALBUTEROL SULFATE (2.5 MG/3ML) 0.083% IN NEBU: 2.5000 mg | INHALATION_SOLUTION | RESPIRATORY_TRACT | Status: DC | PRN

## 2016-05-29 NOTE — Progress Notes (Signed)
Dialysis treatment completed.  3500 mL ultrafiltrated.  3000 mL net fluid removal.  Patient status unchanged. Lung sounds diminished to ausculation in all fields. Generalized edema. Cardiac: NSR.  Cleansed RIJ catheter with chlorhexidine.  Disconnected lines and flushed ports with saline per protocol.  Ports locked with heparin and capped per protocol.    Report given to bedside, RN Anisha.

## 2016-05-29 NOTE — Progress Notes (Signed)
Patient arrived to unit by bed.  Reviewed treatment plan and this RN agrees with plan.  Report received from bedside RN, Anisha.  Consent verified.  Patient A & O X 4.   Lung sounds clear to ausculation in all fields. Generalized edema. Cardiac:  NSR.  Removed caps and cleansed RIJ catheter with chlorhedxidine.  Aspirated ports of heparin and flushed them with saline per protocol.  Connected and secured lines, initiated treatment at 1334.  UF Goal of 3500 and net fluid removal 3000.  Will continue to monitor.

## 2016-05-29 NOTE — H&P (Signed)
History and Physical    AIMEE TIMMONS ZOX:[id number] DOB: 25-Mar-1966 DOA: 05/28/2016  Referring MD/NP/PA: Dr. Wyvonnia Dusky PCP: Angelica Chessman, MD  Patient coming from: home  Chief Complaint: Need dialysis  HPI: Frank [name] is a 50 y.o. male with medical history significant of ESRD on HD( T/TH/Sat), chronic low back pain, anemia of chronic disease, nonischemic cardiomyopathy, and DM type 2; who presents complaining of needing hemodialysis. Patient reports having at least 4-5 episodes of nausea with nonbloody emesis  prior to arrival. Associated symptoms include shortness of breath with intermittent wheezing. Just lasts admitted to the hospital 2 days ago for dialysis. Patient has had no medication changes. He denies having any headache, fever, abdominal pain, or chest pain.  ED Course: Upon admission to the emergency department patient was seen to be afebrile, O2 saturations maintained on room air, blood pressure as high as 172/94, and all other vitals within normal limits. Lab work revealed hemoglobin 7.8, potassium 6.2, BUN 37, and creatinine 11.78. Nephrology was called regarding patient and will dialyze him in the morning. He was given 45 g of Kayexalate, 50 mEq of sodium bicarbonate, 1 g of calcium gluconate, and 5 mg of Percocet while in the ER. Patient was noted to intermittently declined certain tests and evaluations including chest x-ray and vitals.  Review of Systems: As per HPI otherwise 10 point review of systems negative.   Past Medical History:  Diagnosis Date  . Anemia   . Anxiety   . Chronic combined systolic and diastolic CHF (congestive heart failure) (HCC)    a. EF 20-25% by echo in 08/2015 b. echo 10/2015: EF 35-40%, diffuse HK, severe LAE, moderate RAE, small pericardial effusion  . Complication of anesthesia    itching, sore throat  . Depression   . Dialysis patient ([place])   . ESRD (end stage renal disease) ([place])    due to HTN per patient, followed at [facility], s/p failed  kidney transplant - dialysis Tue, Th, Sat  . Hyperkalemia 12/2015  . Hypertension   . Junctional rhythm    a. noted in 08/2015: hyperkalemic at that time  b. 12/2015: presented in junctional rhythm w/ K+ of 6.6. Resolved with improvement of K+ levels.  . Nonischemic cardiomyopathy ([place])    a. 08/2014: cath showing minimal CAD, but tortuous arteries noted.   . Personal history of DVT (deep vein thrombosis)/ PE 05/26/2016   In Oct 2015 had small subsemental LUL PE w/o DVT (LE dopplers neg) and was felt to be HD cath related, treated w coumadin.  IN May 2016 had small vein DVT (acute/subacute) in the R basilic/ brachial veins of the RUE, resumed on coumadin.  Had R sided HD cath at that time.    . Renal insufficiency   . Shortness of breath   . Type II diabetes mellitus (HCC)    No history per patient, but remains under history as A1c would not be accurate given on dialysis    Past Surgical History:  Procedure Laterality Date  . CAPD INSERTION    . CAPD REMOVAL    . INGUINAL HERNIA REPAIR Right 02/14/2015   Procedure: REPAIR INCARCERATED RIGHT INGUINAL HERNIA;  Surgeon: [name], MD;  Location: [place];  Service: General;  Laterality: Right;  . INSERTION OF DIALYSIS CATHETER Right 09/23/2015   Procedure: exchange of Right internal Dialysis Catheter.;  Surgeon: [name], MD;  Location: [place];  Service: Vascular;  Laterality: Right;  . KIDNEY RECEIPIENT  2006   failed  and started HD in March 2014  . LEFT HEART CATHETERIZATION WITH CORONARY ANGIOGRAM N/A 09/02/2014   Procedure: LEFT HEART CATHETERIZATION WITH CORONARY ANGIOGRAM;  Surgeon: [name], MD;  Location: [facility] CATH LAB;  Service: Cardiovascular;  Laterality: N/A;     reports that he has quit smoking. His smoking use included Cigarettes. He smoked 0.00 packs per day for 1.00 year. He has never used smokeless tobacco. He reports that he does not drink alcohol or use drugs.  Allergies  Allergen Reactions  .  Butalbital-Apap-Caffeine Shortness Of Breath and Swelling    Swelling in throat  . Ferrlecit [Na Ferric Gluc Cplx In Sucrose] Shortness Of Breath, Swelling and Other (See Comments)    Swelling in throat  . Minoxidil Shortness Of Breath  . Darvocet [Propoxyphene N-Acetaminophen] Hives  . Other Hives    Allergy listed on file from [place] (Pt did not mention any allergies) Pt able to do norco (tylenol)    Family History  Problem Relation Age of Onset  . Hypertension Other     Prior to Admission medications   Medication Sig Start Date End Date Taking? Authorizing Provider  allopurinol (ZYLOPRIM) 100 MG tablet Take 100 mg by mouth daily.    Historical Provider, MD  amLODipine (NORVASC) 10 MG tablet Take 10 mg by mouth daily.    Historical Provider, MD  atorvastatin (LIPITOR) 40 MG tablet Take 1 tablet (40 mg total) by mouth daily at 6 PM. 09/02/14   Barton Dubois, MD  AURYXIA 1 GM 210 MG(Fe) TABS Take 420 mg by mouth 3 (three) times daily. 01/26/16   Historical Provider, MD  carvedilol (COREG) 25 MG tablet Take 25 mg by mouth 2 (two) times daily with a meal.    Historical Provider, MD  cinacalcet (SENSIPAR) 30 MG tablet Take 30 mg by mouth daily.    Historical Provider, MD  cloNIDine (CATAPRES - DOSED IN MG/24 HR) 0.3 mg/24hr patch Place 0.3 mg onto the skin once a week.    Historical Provider, MD  doxazosin (CARDURA) 2 MG tablet Take 2 mg by mouth daily.    Historical Provider, MD  famotidine (PEPCID) 20 MG tablet Take 1 tablet (20 mg total) by mouth 2 (two) times daily. 02/13/16   Charlesetta Shanks, MD  gabapentin (NEURONTIN) 100 MG capsule Take 1 capsule (100 mg total) by mouth at bedtime. 05/21/16   [name], DO  hydrALAZINE (APRESOLINE) 100 MG tablet Take 100 mg by mouth every 8 (eight) hours.    Historical Provider, MD  isosorbide mononitrate (IMDUR) 60 MG 24 hr tablet Take 1 tablet (60 mg total) by mouth daily. 01/09/16   Smiley Houseman, MD  omeprazole (PRILOSEC) 20 MG capsule  Take 20 mg by mouth daily. 07/01/13   Historical Provider, MD  ondansetron (ZOFRAN ODT) 4 MG disintegrating tablet Take 1 tablet (4 mg total) by mouth every 8 (eight) hours as needed for nausea or vomiting. 05/16/16   Sherwood Gambler, MD  oxycodone (OXY-IR) 5 MG capsule Take 5 mg by mouth every 4 (four) hours as needed for pain.     Historical Provider, MD  predniSONE (DELTASONE) 10 MG tablet Take 10 mg by mouth daily with breakfast.    Historical Provider, MD  sevelamer carbonate (RENVELA) 800 MG tablet Take 800-1,600 mg by mouth 3 (three) times daily with meals. 2 tabs three times daily with meals, and 1 tablet with snacks    Historical Provider, MD  warfarin (COUMADIN) 2 MG tablet Take 2  mg by mouth daily. Take with 848m tablet to equal 7748m7/12/17   Historical Provider, MD  warfarin (COUMADIN) 5 MG tablet Take 5 mg by mouth daily. Take with 48m64mablet to equal 7mg23m12/17   Historical Provider, MD    Physical Exam:    Constitutional:  Older male in NAD, calm, comfortable Vitals:   05/28/16 1952 05/28/16 1953 05/28/16 2210 05/28/16 2300  BP: 135/81  178/98 155/90  Pulse: 70  62 78  Resp: 20  14   Temp: 98.3 F (36.8 C)  97.7 F (36.5 C)   TempSrc: Oral  Oral   SpO2: 99% 98% 100% 98%  Weight:  73 kg (161 lb)    Height:  _0  (1.88 m)     Eyes: PERRL, lids and conjunctivae normal ENMT: Mucous membranes are moist. Posterior pharynx clear of any exudate or lesions.Normal dentition.  Neck: normal, supple, no masses, no thyromegaly Respiratory: Decreased overall aerationsignificant wheezes or rhonchi appreciated. Cardiovascular: Regular rate and rhythm, no murmurs / rubs / gallops. No extremity edema. 2+ pedal pulses. No carotid bruits. Dialysis catheter to the right chest wall Abdomen: no tenderness, no masses palpated. No hepatosplenomegaly. Bowel sounds positive.  Musculoskeletal: no clubbing / cyanosis. No joint deformity upper and lower extremities. Good ROM, no contractures. Normal  muscle tone.  Tenderness to palpation of the lumbar spine. Skin: no rashes, lesions, ulcers. No induration Neurologic: CN 2-12 grossly intact. Sensation intact, DTR normal. Strength 5/5 in all 4.  Psychiatric: Alert and oriented x 3. Normal mood.     Labs on Admission: I have personally reviewed following labs and imaging studies  CBC:  Recent Labs Lab 05/26/16 0116 05/27/16 0711 05/27/16 1106 05/28/16 2310 05/28/16 2319  WBC  --  6.1  --  5.4  --   NEUTROABS  --   --   --  3.5  --   HGB 9.5* 7.6* 8.2* 7.8* 9.2*  HCT 28.0* 23.2* 24.0* 25.2* 27.0*  MCV  --  87.2  --  90.0  --   PLT  --  200  --  187  --    Basic Metabolic Panel:  Recent Labs Lab 05/26/16 0116 05/27/16 0712 05/27/16 1106 05/28/16 2310 05/28/16 2319  NA 140 138 138 140 141  K 6.0* 6.3* 3.5 6.2* 6.1*  CL 100* 95* 96* 102 102  CO2  --  27  --  25  --   GLUCOSE 95 73 121* 119* 111*  BUN 48* 55* 17 37* 38*  CREATININE 13.90* 15.99* 5.80* 11.78* 11.70*  CALCIUM  --  8.4*  --  7.9*  --   PHOS  --  4.2  --   --   --    GFR: Estimated Creatinine Clearance: 7.6 mL/min (by C-G formula based on SCr of 11.7 mg/dL (H)). Liver Function Tests:  Recent Labs Lab 05/27/16 0712 05/28/16 2310  AST  --  30  ALT  --  14*  ALKPHOS  --  126  BILITOT  --  0.4  PROT  --  6.9  ALBUMIN 2.7* 3.1*   No results for input(s): LIPASE, AMYLASE in the last 168 hours. No results for input(s): AMMONIA in the last 168 hours. Coagulation Profile:  Recent Labs Lab 05/28/16 2310  INR 1.16   Cardiac Enzymes: No results for input(s): CKTOTAL, CKMB, CKMBINDEX, TROPONINI in the last 168 hours. BNP (last 3 results) No results for input(s): PROBNP in the last 8760 hours. HbA1C: No results for input(s): HGBA1C  in the last 72 hours. CBG: No results for input(s): GLUCAP in the last 168 hours. Lipid Profile: No results for input(s): CHOL, HDL, LDLCALC, TRIG, CHOLHDL, LDLDIRECT in the last 72 hours. Thyroid Function Tests: No  results for input(s): TSH, T4TOTAL, FREET4, T3FREE, THYROIDAB in the last 72 hours. Anemia Panel: No results for input(s): VITAMINB12, FOLATE, FERRITIN, TIBC, IRON, RETICCTPCT in the last 72 hours. Urine analysis:    Component Value Date/Time   COLORURINE YELLOW 10/18/2013 [place] 10/18/2013 0419   LABSPEC 1.008 10/18/2013 0419   PHURINE 8.5 (H) 10/18/2013 0419   GLUCOSEU 100 (A) 10/18/2013 0419   HGBUR TRACE (A) 10/18/2013 0419   BILIRUBINUR NEGATIVE 10/18/2013 0419   KETONESUR NEGATIVE 10/18/2013 0419   PROTEINUR 100 (A) 10/18/2013 0419   UROBILINOGEN 0.2 10/18/2013 0419   NITRITE NEGATIVE 10/18/2013 0419   LEUKOCYTESUR NEGATIVE 10/18/2013 0419   Sepsis Labs: No results found for this or any previous visit (from the past 240 hour(s)).   Radiological Exams on Admission: No results found.  EKG: Independently reviewed. Sinus rhythm with LVH and peaking T waves  Assessment/Plan Hyperkalemia: Acute. Potassium 6.2 on admission. EKG showed peaking T waves. Given 1 g of calcium gluconate, 45 g Kayexalate, and 1 amp of sodium bicarbonate in the ED. Nephrology consulted and will dialyze patient in a.m. - Admit to telemetry bed - f/u repeat potassium  ESRD on HD: Patient without HD center requiring frequent admissions between [facility] [place] [facility] [place] - Hemodialysis per nephrology - Social work consult  Anemia of chronic renal disease: Hemoglobin 7.8 on admission.  - Continue iron supplementation - Per nephrology  Nausea and vomiting: Acute on chronic. Patient as a chronic history of this - Zofran when necessary  Systolic CHF: last EF 87-19%  - Continue To monitor   Essential hypertension - Continue doxazosin, Coreg, clonidine patch, amlodipine, hydralazine, and isosorbide mononitrate  Chronic low back pain - Continue oxycodone IR prn  History of DVT/PE - Coumadin per pharmacy  Hyperlipidemia - Continue atorvastatin     GERD - Continue Pepcid and pharmacy substitution of  Protonix for omeprazole  History of this renal mass: - Patient to follow-up with Dr. Felicie Morn   DVT prophylaxis: heparin Code Status: Full  Family Communication: No family present at bedside Disposition Plan: Will likely be able to be discharge home after dialysis Consults called: Nephrology Admission status: obs tele  Norval Morton MD [facility] Pager [phone]  If 7PM-7AM, please contact night-coverage [email or web address] Password [email or web address]  05/29/2016, 2:23 AM

## 2016-05-29 NOTE — Progress Notes (Signed)
ANTICOAGULATION CONSULT NOTE - Initial Consult  Pharmacy Consult for Warfarin  Indication: Hx PE/DVT  Allergies  Allergen Reactions  . Butalbital-Apap-Caffeine Shortness Of Breath and Swelling    Swelling in throat  . Ferrlecit [Na Ferric Gluc Cplx In Sucrose] Shortness Of Breath, Swelling and Other (See Comments)    Swelling in throat  . Minoxidil Shortness Of Breath  . Darvocet [Propoxyphene N-Acetaminophen] Hives  . Other Hives    Allergy listed on file from [place] (Pt did not mention any allergies) Pt able to do norco (tylenol)    Patient Measurements: Height: 6' 2" (188 cm) Weight: 161 lb (73 kg) IBW/kg (Calculated) : 82.2  Vital Signs: Temp: 97.7 F (36.5 C) (11/07 2210) Temp Source: Oral (11/07 2210) BP: 155/90 (11/07 2300) Pulse Rate: 78 (11/07 2300)  Labs:  Recent Labs  05/27/16 0711  05/27/16 1106 05/28/16 2310 05/28/16 2319  HGB 7.6*  --  8.2* 7.8* 9.2*  HCT 23.2*  --  24.0* 25.2* 27.0*  PLT 200  --   --  187  --   LABPROT  --   --   --  14.9  --   INR  --   --   --  1.16  --   CREATININE  --   < > 5.80* 11.78* 11.70*  < > = values in this interval not displayed.  Estimated Creatinine Clearance: 7.6 mL/min (by C-G formula based on SCr of 11.7 mg/dL (H)).   Medical History: Past Medical History:  Diagnosis Date  . Anemia   . Anxiety   . Chronic combined systolic and diastolic CHF (congestive heart failure) (HCC)    a. EF 20-25% by echo in 08/2015 b. echo 10/2015: EF 35-40%, diffuse HK, severe LAE, moderate RAE, small pericardial effusion  . Complication of anesthesia    itching, sore throat  . Depression   . Dialysis patient ([place])   . ESRD (end stage renal disease) ([place])    due to HTN per patient, followed at [facility], s/p failed kidney transplant - dialysis Tue, Th, Sat  . Hyperkalemia 12/2015  . Hypertension   . Junctional rhythm    a. noted in 08/2015: hyperkalemic at that time  b. 12/2015: presented in junctional rhythm w/ K+ of 6.6.  Resolved with improvement of K+ levels.  . Nonischemic cardiomyopathy ([place])    a. 08/2014: cath showing minimal CAD, but tortuous arteries noted.   . Personal history of DVT (deep vein thrombosis)/ PE 05/26/2016   In Oct 2015 had small subsemental LUL PE w/o DVT (LE dopplers neg) and was felt to be HD cath related, treated w coumadin.  IN May 2016 had small vein DVT (acute/subacute) in the R basilic/ brachial veins of the RUE, resumed on coumadin.  Had R sided HD cath at that time.    . Renal insufficiency   . Shortness of breath   . Type II diabetes mellitus (HCC)    No history per patient, but remains under history as A1c would not be accurate given on dialysis     Assessment: 50 y/o M here with emesis and needing HD, warfarin PTA for hx DVT/PE, INR low at 1.16, ?compliance.   Goal of Therapy:  INR 2-3 Monitor platelets by anticoagulation protocol: Yes   Plan:  -Warfarin 41m PO x 1 now -Daily PT/INR -Monitor for bleeding  LNarda Bonds11/02/2016,2:47 AM

## 2016-05-29 NOTE — Progress Notes (Addendum)
New Admission Note:   Arrival Method: strtetcher Mental Orientation: axo  x4 Telemetry:refused Assessment: Completed Skin:see assessment JG:[id number]   forearm Pain: medicated Tubes:none Safety Measures: Safety Fall Prevention Plan has been , discussed  Admission: Completed [address] [place] Orientation: Patient has been orientated to the room, unit and staff.  Family:none available       Pt refusing  VS on arrival to unit     Orders have been reviewed and implemented. Will continue to monitor the patient. Call light has been placed within reach Amado Coe, RN Phone number: [phone]

## 2016-05-29 NOTE — ED Notes (Signed)
Pt refused transport to x-ray x 2 attempts.

## 2016-05-29 NOTE — Progress Notes (Signed)
Critical lab value received: 6.9.  Nephrology notified and acid bath altered.    Will continue to monitor.

## 2016-05-29 NOTE — Progress Notes (Signed)
Pt admitted after midnight. Please refer to admission note done 05/29/2016.  Pt with history of HD on TTS, hypertension, dyslipidemia who presented to [facility] with need for dialysis. He was found to have hyperkalemia and on 12 lead EKG peaking T waves.   Assessment and Plan:  Hyperkalemia - Due to CKD - Given calcium gluconate, kayexalate and sodium bicarb - Appreciate nephrology recommendations  ESRD on HD - TTS - Management per renal    Leisa Lenz [facility] [phone]

## 2016-05-29 NOTE — Progress Notes (Signed)
ANTICOAGULATION CONSULT NOTE - Follow Up Consult  Pharmacy Consult for Warfarin Indication: Hx PE/DVT  Allergies  Allergen Reactions  . Butalbital-Apap-Caffeine Shortness Of Breath and Swelling    Swelling in throat  . Ferrlecit [Na Ferric Gluc Cplx In Sucrose] Shortness Of Breath, Swelling and Other (See Comments)    Swelling in throat  . Minoxidil Shortness Of Breath  . Darvocet [Propoxyphene N-Acetaminophen] Hives  . Other Hives    Allergy listed on file from [place] (Pt did not mention any allergies) Pt able to do norco (tylenol)    Patient Measurements: Height: _0  (188 cm) Weight: 161 lb (73 kg) IBW/kg (Calculated) : 82.2  Vital Signs: BP: 182/99 (11/08 0842)  Labs:  Recent Labs  05/27/16 0711  05/27/16 1106 05/28/16 2310 05/28/16 2319  HGB 7.6*  --  8.2* 7.8* 9.2*  HCT 23.2*  --  24.0* 25.2* 27.0*  PLT 200  --   --  187  --   LABPROT  --   --   --  14.9  --   INR  --   --   --  1.16  --   CREATININE  --   < > 5.80* 11.78* 11.70*  < > = values in this interval not displayed.  Estimated Creatinine Clearance: 7.6 mL/min (by C-G formula based on SCr of 11.7 mg/dL (H)).   Assessment: 24 YOM with ESRD with no home HD unit who comes to dialyze frequently at [facility] and [place] was asked to resume warfarin from PTA for hx PE/DVT.  Per review of the notes - VTE episodes appear to be related to HD cath and per renal to consider stopping all anticoagulation once [facility] out and AVF matured. The patient is clearly noncompliant outpatient as the INR is always near baseline.   Admit INR SUBtherapeutic at 1.16 - warfarin 7 mg x 1 dose given this AM. Will give an additional 3 mg this evening for a total daily dose of 10 mg and f/u INR in the AM. Hgb 9.2, plts wnl - known anemia related to ESRD. No overt s/sx of bleeding noted. The patient is on Hep SQ for VTE prophylaxis while INR </= 1.8  Goal of Therapy:  INR 2-3   Plan:  1. Warfarin 3 mg x 1 dose at 1800  today (for a total daily dose of 10 mg) 2. Will continue to monitor for any signs/symptoms of bleeding and will follow up with PT/INR in the a.m.   Thank you for allowing pharmacy to be a part of this patient's care.  Alycia Rossetti, PharmD, BCPS Clinical Pharmacist Pager: [phone] Clinical phone for 05/29/2016 from 7a-3:30p: [phone] If after 3:30p, please call main pharmacy at: [phone] 05/29/2016 11:41 AM

## 2016-05-29 NOTE — Progress Notes (Signed)
[facility] Brief Note  Made aware of pt's hospitalization OBS status ESRD but has no home HD unit Was just here for HD on Monday Needs HD today Orders written for 4 hour treatment Checking Fe studies and giving Aranesp 200 EDW unknown - pull 3 liters  Jamal Maes, MD [facility] [phone] Pager 05/29/2016, 9:21 AM

## 2016-05-29 NOTE — Progress Notes (Signed)
Orders for patient to be on telemetry. Patient refused and educated. MD notified. Will continue to monitor.

## 2016-05-30 ENCOUNTER — Emergency Department (HOSPITAL_COMMUNITY): Payer: Medicare Other

## 2016-05-30 ENCOUNTER — Encounter (HOSPITAL_COMMUNITY): Payer: Self-pay

## 2016-05-30 ENCOUNTER — Observation Stay (HOSPITAL_COMMUNITY)
Admission: EM | Admit: 2016-05-30 | Discharge: 2016-05-31 | Disposition: A | Discharge status: Home / Self Care | Payer: Medicare Other | Attending: Internal Medicine | Admitting: Internal Medicine

## 2016-05-30 DIAGNOSIS — Z79899 Other long term (current) drug therapy: Secondary | ICD-10-CM | POA: Insufficient documentation

## 2016-05-30 DIAGNOSIS — N185 Chronic kidney disease, stage 5: Secondary | ICD-10-CM | POA: Diagnosis not present

## 2016-05-30 DIAGNOSIS — I2510 Atherosclerotic heart disease of native coronary artery without angina pectoris: Secondary | ICD-10-CM | POA: Insufficient documentation

## 2016-05-30 DIAGNOSIS — I132 Hypertensive heart and chronic kidney disease with heart failure and with stage 5 chronic kidney disease, or end stage renal disease: Secondary | ICD-10-CM | POA: Insufficient documentation

## 2016-05-30 DIAGNOSIS — I5042 Chronic combined systolic (congestive) and diastolic (congestive) heart failure: Secondary | ICD-10-CM | POA: Insufficient documentation

## 2016-05-30 DIAGNOSIS — I428 Other cardiomyopathies: Secondary | ICD-10-CM

## 2016-05-30 DIAGNOSIS — E785 Hyperlipidemia, unspecified: Secondary | ICD-10-CM | POA: Insufficient documentation

## 2016-05-30 DIAGNOSIS — N189 Chronic kidney disease, unspecified: Secondary | ICD-10-CM

## 2016-05-30 DIAGNOSIS — I10 Essential (primary) hypertension: Secondary | ICD-10-CM | POA: Diagnosis present

## 2016-05-30 DIAGNOSIS — E1122 Type 2 diabetes mellitus with diabetic chronic kidney disease: Secondary | ICD-10-CM | POA: Insufficient documentation

## 2016-05-30 DIAGNOSIS — Z7952 Long term (current) use of systemic steroids: Secondary | ICD-10-CM | POA: Insufficient documentation

## 2016-05-30 DIAGNOSIS — R001 Bradycardia, unspecified: Secondary | ICD-10-CM | POA: Diagnosis not present

## 2016-05-30 DIAGNOSIS — E875 Hyperkalemia: Secondary | ICD-10-CM | POA: Diagnosis not present

## 2016-05-30 DIAGNOSIS — K219 Gastro-esophageal reflux disease without esophagitis: Secondary | ICD-10-CM | POA: Insufficient documentation

## 2016-05-30 DIAGNOSIS — Z992 Dependence on renal dialysis: Secondary | ICD-10-CM | POA: Insufficient documentation

## 2016-05-30 DIAGNOSIS — G8929 Other chronic pain: Secondary | ICD-10-CM | POA: Diagnosis not present

## 2016-05-30 DIAGNOSIS — Z87891 Personal history of nicotine dependence: Secondary | ICD-10-CM | POA: Insufficient documentation

## 2016-05-30 DIAGNOSIS — R200 Anesthesia of skin: Secondary | ICD-10-CM | POA: Diagnosis not present

## 2016-05-30 DIAGNOSIS — Z86718 Personal history of other venous thrombosis and embolism: Secondary | ICD-10-CM | POA: Insufficient documentation

## 2016-05-30 DIAGNOSIS — D631 Anemia in chronic kidney disease: Secondary | ICD-10-CM | POA: Insufficient documentation

## 2016-05-30 DIAGNOSIS — D649 Anemia, unspecified: Secondary | ICD-10-CM | POA: Diagnosis not present

## 2016-05-30 DIAGNOSIS — N186 End stage renal disease: Secondary | ICD-10-CM | POA: Diagnosis not present

## 2016-05-30 DIAGNOSIS — E1159 Type 2 diabetes mellitus with other circulatory complications: Secondary | ICD-10-CM | POA: Diagnosis present

## 2016-05-30 DIAGNOSIS — R202 Paresthesia of skin: Secondary | ICD-10-CM | POA: Diagnosis not present

## 2016-05-30 DIAGNOSIS — Z7901 Long term (current) use of anticoagulants: Secondary | ICD-10-CM | POA: Insufficient documentation

## 2016-05-30 DIAGNOSIS — Y830 Surgical operation with transplant of whole organ as the cause of abnormal reaction of the patient, or of later complication, without mention of misadventure at the time of the procedure: Secondary | ICD-10-CM | POA: Insufficient documentation

## 2016-05-30 DIAGNOSIS — T8612 Kidney transplant failure: Secondary | ICD-10-CM | POA: Insufficient documentation

## 2016-05-30 LAB — DIFFERENTIAL
BASOS ABS: 0 10*3/uL (ref 0.0–0.1)
BASOS PCT: 0 %
Eosinophils Absolute: 0 10*3/uL (ref 0.0–0.7)
Eosinophils Relative: 0 %
LYMPHS ABS: 0.6 10*3/uL — AB (ref 0.7–4.0)
LYMPHS PCT: 10 %
MONO ABS: 0.5 10*3/uL (ref 0.1–1.0)
MONOS PCT: 9 %
NEUTROS ABS: 5.2 10*3/uL (ref 1.7–7.7)
Neutrophils Relative %: 81 %

## 2016-05-30 LAB — I-STAT CHEM 8, ED
BUN: 34 mg/dL — ABNORMAL HIGH (ref 6–20)
CALCIUM ION: 0.86 mmol/L — AB (ref 1.15–1.40)
CHLORIDE: 98 mmol/L — AB (ref 101–111)
CREATININE: 10.2 mg/dL — AB (ref 0.61–1.24)
GLUCOSE: 97 mg/dL (ref 65–99)
HCT: 27 % — ABNORMAL LOW (ref 39.0–52.0)
Hemoglobin: 9.2 g/dL — ABNORMAL LOW (ref 13.0–17.0)
Potassium: 6.4 mmol/L (ref 3.5–5.1)
SODIUM: 134 mmol/L — AB (ref 135–145)
TCO2: 28 mmol/L (ref 0–100)

## 2016-05-30 LAB — I-STAT TROPONIN, ED
TROPONIN I, POC: 0.03 ng/mL (ref 0.00–0.08)
Troponin i, poc: 0.01 ng/mL (ref 0.00–0.08)

## 2016-05-30 LAB — CBC
HEMATOCRIT: 26.3 % — AB (ref 39.0–52.0)
HEMOGLOBIN: 8.2 g/dL — AB (ref 13.0–17.0)
MCH: 28.1 pg (ref 26.0–34.0)
MCHC: 31.2 g/dL (ref 30.0–36.0)
MCV: 90.1 fL (ref 78.0–100.0)
Platelets: 148 10*3/uL — ABNORMAL LOW (ref 150–400)
RBC: 2.92 MIL/uL — ABNORMAL LOW (ref 4.22–5.81)
RDW: 16.4 % — AB (ref 11.5–15.5)
WBC: 6.4 10*3/uL (ref 4.0–10.5)

## 2016-05-30 LAB — BASIC METABOLIC PANEL
Anion gap: 10 (ref 5–15)
BUN: 20 mg/dL (ref 6–20)
CHLORIDE: 99 mmol/L — AB (ref 101–111)
CO2: 29 mmol/L (ref 22–32)
CREATININE: 7.71 mg/dL — AB (ref 0.61–1.24)
Calcium: 8.1 mg/dL — ABNORMAL LOW (ref 8.9–10.3)
GFR calc Af Amer: 8 mL/min — ABNORMAL LOW (ref >60)
GFR, EST NON AFRICAN AMERICAN: 7 mL/min — AB (ref >60)
GLUCOSE: 83 mg/dL (ref 65–99)
Potassium: 4.6 mmol/L (ref 3.5–5.1)
SODIUM: 138 mmol/L (ref 135–145)

## 2016-05-30 LAB — COMPREHENSIVE METABOLIC PANEL
ALK PHOS: 111 U/L (ref 38–126)
ALT: 13 U/L — AB (ref 17–63)
AST: 30 U/L (ref 15–41)
Albumin: 3.1 g/dL — ABNORMAL LOW (ref 3.5–5.0)
Anion gap: 10 (ref 5–15)
BILIRUBIN TOTAL: 0.5 mg/dL (ref 0.3–1.2)
BUN: 28 mg/dL — AB (ref 6–20)
CALCIUM: 8 mg/dL — AB (ref 8.9–10.3)
CO2: 26 mmol/L (ref 22–32)
CREATININE: 9.34 mg/dL — AB (ref 0.61–1.24)
Chloride: 98 mmol/L — ABNORMAL LOW (ref 101–111)
GFR, EST AFRICAN AMERICAN: 7 mL/min — AB (ref >60)
GFR, EST NON AFRICAN AMERICAN: 6 mL/min — AB (ref >60)
Glucose, Bld: 106 mg/dL — ABNORMAL HIGH (ref 65–99)
Potassium: 6.3 mmol/L (ref 3.5–5.1)
Sodium: 134 mmol/L — ABNORMAL LOW (ref 135–145)
TOTAL PROTEIN: 7.1 g/dL (ref 6.5–8.1)

## 2016-05-30 LAB — PROTIME-INR
INR: 1.29
INR: 1.38
Prothrombin Time: 16.2 seconds — ABNORMAL HIGH (ref 11.4–15.2)
Prothrombin Time: 17.1 seconds — ABNORMAL HIGH (ref 11.4–15.2)

## 2016-05-30 LAB — ETHANOL

## 2016-05-30 LAB — APTT: aPTT: 28 seconds (ref 24–36)

## 2016-05-30 MED ORDER — ACETAMINOPHEN 500 MG PO TABS: 1000.0000 mg | ORAL_TABLET | Freq: Once | ORAL | Status: DC

## 2016-05-30 MED ORDER — SODIUM CHLORIDE 0.9 % IV SOLN
1.0000 g | Freq: Once | INTRAVENOUS | Status: AC
  Administered 2016-05-30: 1 g via INTRAVENOUS
  Filled 2016-05-30: qty 10

## 2016-05-30 MED ORDER — FAMOTIDINE 20 MG PO TABS: 20.0000 mg | ORAL_TABLET | Freq: Every day | ORAL | Status: DC

## 2016-05-30 MED ORDER — INSULIN ASPART 100 UNIT/ML ~~LOC~~ SOLN
10.0000 [IU] | Freq: Once | SUBCUTANEOUS | Status: AC
  Administered 2016-05-30: 10 [IU] via INTRAVENOUS
  Filled 2016-05-30: qty 1

## 2016-05-30 MED ORDER — OXYCODONE HCL 5 MG PO CAPS: 5.0000 mg | ORAL_CAPSULE | ORAL | 0 refills | Status: DC | PRN

## 2016-05-30 MED ORDER — DEXTROSE 50 % IV SOLN
50.0000 mL | Freq: Once | INTRAVENOUS | Status: AC
  Administered 2016-05-30: 50 mL via INTRAVENOUS
  Filled 2016-05-30: qty 50

## 2016-05-30 MED ORDER — WARFARIN SODIUM 10 MG PO TABS: 10.0000 mg | ORAL_TABLET | Freq: Once | ORAL | Status: DC

## 2016-05-30 NOTE — Discharge Instructions (Signed)
Chronic Kidney Disease Chronic kidney disease occurs when the kidneys are damaged over a long period. The kidneys are two organs that lie on either side of the spine between the middle of the back and the front of the abdomen. The kidneys:  Remove wastes and extra water from the blood.  Produce important hormones. These help keep bones strong, regulate blood pressure, and help create red blood cells.  Balance the fluids and chemicals in the blood and tissues. A small amount of kidney damage may not cause problems, but a large amount of damage may make it difficult or impossible for the kidneys to work the way they should. If steps are not taken to slow down the kidney damage or stop it from getting worse, the kidneys may stop working permanently. Most of the time, chronic kidney disease does not go away. However, it can often be controlled, and those with the disease can usually live normal lives. CAUSES The most common causes of chronic kidney disease are diabetes and high blood pressure (hypertension). Chronic kidney disease may also be caused by:  Diseases that cause the kidneys' filters to become inflamed.  Diseases that affect the immune system.  Genetic diseases.  Medicines that damage the kidneys, such as anti-inflammatory medicines.  Poisoning or exposure to toxic substances.  A reoccurring kidney or urinary infection.  A problem with urine flow. This may be caused by:  Cancer.  Kidney stones.  An enlarged prostate in males. SIGNS AND SYMPTOMS Because the kidney damage in chronic kidney disease occurs slowly, symptoms develop slowly and may not be obvious until the kidney damage becomes severe. A person may have a kidney disease for years without showing any symptoms. Symptoms can include:  Swelling (edema) of the legs, ankles, or feet.  Tiredness (lethargy).  Nausea or vomiting.  Confusion.  Problems with urination, such as:  Decreased urine  production.  Frequent urination, especially at night.  Frequent accidents in children who are potty trained.  Muscle twitches and cramps.  Shortness of breath.  Weakness.  Persistent itchiness.  Loss of appetite.  Metallic taste in the mouth.  Trouble sleeping.  Slowed development in children.  Short stature in children. DIAGNOSIS Chronic kidney disease may be detected and diagnosed by tests, including blood, urine, imaging, or kidney biopsy tests. TREATMENT Most chronic kidney diseases cannot be cured. Treatment usually involves relieving symptoms and preventing or slowing the progression of the disease. Treatment may include:  A special diet. You may need to avoid alcohol and foods thatare salty and high in potassium.  Medicines. These may:  Lower blood pressure.  Relieve anemia.  Relieve swelling.  Protect the bones. HOME CARE INSTRUCTIONS  Follow your prescribed diet. Your health care provider may instruct you to limit daily salt (sodium) and protein intake.  Take medicines only as directed by your health care provider. Do not take any new medicines (prescription, over-the-counter, or nutritional supplements) unless approved by your health care provider. Many medicines can worsen your kidney damage or need to have the dose adjusted.   Quit smoking if you smoke. Talk to your health care provider about a smoking cessation program.  Keep all follow-up visits as directed by your health care provider.  Monitor your blood pressure.  Start or continue an exercise plan.  Get immunizations as directed by your health care provider.  Take vitamin and mineral supplements as directed by your health care provider. SEEK IMMEDIATE MEDICAL CARE IF:  Your symptoms get worse or you develop  new symptoms.  You develop symptoms of end-stage kidney disease. These include:  Headaches.  Abnormally dark or light skin.  Numbness in the hands or feet.  Easy  bruising.  Frequent hiccups.  Menstruation stops.  You have a fever.  You have decreased urine production.  You havepain or bleeding when urinating. MAKE SURE YOU:  Understand these instructions.  Will watch your condition.  Will get help right away if you are not doing well or get worse. FOR MORE INFORMATION   American Association of Kidney Patients: BombTimer.gl  National Kidney Foundation: www.kidney.[place]: https://mathis.com/  [facility]: www.lifeoptions.org and www.kidneyschool.org   This information is not intended to replace advice given to you by your health care provider. Make sure you discuss any questions you have with your health care provider.   Document Released: 04/16/2008 Document Revised: 07/29/2014 Document Reviewed: 03/06/2012 Elsevier Interactive Patient Education [employer].

## 2016-05-30 NOTE — ED Notes (Addendum)
Remains in MRI

## 2016-05-30 NOTE — ED Provider Notes (Deleted)
[facility] DEPT Provider Note   CSN: [id number] Arrival date & time: 05/30/16  5400     History   Chief Complaint Chief Complaint  Patient presents with  . Weakness    HPI Frank [name] is a 50 y.o. male.  HPI complains of tingling which started in his left arm followed by tingling in his left leg onset 4 PM today. He denies any difficulty speaking any difficulty moving his extremities. He reports that he did become sweaty for several minutes which resolved. He denies pain anywhere. Patient was discharged from overnight stay here earlier this afternoon. Tingling is improving with time.. Last hemodialysis session yesterday. Denies chest pain denies fever denies headache denies shortness of breath no nausea or vomiting no other associated symptoms. No treatment prior to coming here  Past Medical History:  Diagnosis Date  . Anemia   . Anxiety   . Chronic combined systolic and diastolic CHF (congestive heart failure) (HCC)    a. EF 20-25% by echo in 08/2015 b. echo 10/2015: EF 35-40%, diffuse HK, severe LAE, moderate RAE, small pericardial effusion  . Complication of anesthesia    itching, sore throat  . Depression   . Dialysis patient ([place])   . ESRD (end stage renal disease) ([place])    due to HTN per patient, followed at [facility], s/p failed kidney transplant - dialysis Tue, Th, Sat  . Hyperkalemia 12/2015  . Hypertension   . Junctional rhythm    a. noted in 08/2015: hyperkalemic at that time  b. 12/2015: presented in junctional rhythm w/ K+ of 6.6. Resolved with improvement of K+ levels.  . Nonischemic cardiomyopathy ([place])    a. 08/2014: cath showing minimal CAD, but tortuous arteries noted.   . Personal history of DVT (deep vein thrombosis)/ PE 05/26/2016   In Oct 2015 had small subsemental LUL PE w/o DVT (LE dopplers neg) and was felt to be HD cath related, treated w coumadin.  IN May 2016 had small vein DVT (acute/subacute) in the R basilic/ brachial veins of the RUE, resumed on  coumadin.  Had R sided HD cath at that time.    . Renal insufficiency   . Shortness of breath   . Type II diabetes mellitus (HCC)    No history per patient, but remains under history as A1c would not be accurate given on dialysis    Patient Active Problem List   Diagnosis Date Noted  . Hyperkalemia 05/29/2016  . Chronic back pain 05/29/2016  . GERD (gastroesophageal reflux disease) 05/29/2016  . Personal history of DVT (deep vein thrombosis)/ PE 05/26/2016  . Nonischemic cardiomyopathy ([place]) 01/09/2016  . Bilateral low back pain without sciatica   . Left renal mass 10/30/2015  . Constipation 10/30/2015  . Adjustment disorder with mixed anxiety and depressed mood 08/20/2015  . Chronic epididymitis 06/19/2015  . Groin pain, chronic, right 06/19/2015  . Incarcerated right inguinal hernia 02/16/2015  . Essential hypertension 01/02/2015  . Dyslipidemia   . ESRD on hemodialysis ([place])   . Anemia of chronic kidney failure 06/24/2013    Past Surgical History:  Procedure Laterality Date  . CAPD INSERTION    . CAPD REMOVAL    . INGUINAL HERNIA REPAIR Right 02/14/2015   Procedure: REPAIR INCARCERATED RIGHT INGUINAL HERNIA;  Surgeon: [name], MD;  Location: [facility];  Service: General;  Laterality: Right;  . INSERTION OF DIALYSIS CATHETER Right 09/23/2015   Procedure: exchange of Right internal Dialysis Catheter.;  Surgeon: [name], MD;  Location:  [facility] OR;  Service: Vascular;  Laterality: Right;  . KIDNEY RECEIPIENT  2006   failed and started HD in March 2014  . LEFT HEART CATHETERIZATION WITH CORONARY ANGIOGRAM N/A 09/02/2014   Procedure: LEFT HEART CATHETERIZATION WITH CORONARY ANGIOGRAM;  Surgeon: [name], MD;  Location: [facility] CATH LAB;  Service: Cardiovascular;  Laterality: N/A;       Home Medications    Prior to Admission medications   Medication Sig Start Date End Date Taking? Authorizing Provider  allopurinol (ZYLOPRIM) 100 MG tablet Take 100 mg by mouth daily.     Historical Provider, MD  amLODipine (NORVASC) 10 MG tablet Take 10 mg by mouth daily.    Historical Provider, MD  atorvastatin (LIPITOR) 40 MG tablet Take 1 tablet (40 mg total) by mouth daily at 6 PM. 09/02/14   Barton Dubois, MD  AURYXIA 1 GM 210 MG(Fe) TABS Take 420 mg by mouth 3 (three) times daily. 01/26/16   Historical Provider, MD  carvedilol (COREG) 25 MG tablet Take 25 mg by mouth 2 (two) times daily with a meal.    Historical Provider, MD  cinacalcet (SENSIPAR) 30 MG tablet Take 30 mg by mouth daily.    Historical Provider, MD  cloNIDine (CATAPRES - DOSED IN MG/24 HR) 0.3 mg/24hr patch Place 0.3 mg onto the skin once a week.    Historical Provider, MD  doxazosin (CARDURA) 2 MG tablet Take 2 mg by mouth daily.    Historical Provider, MD  famotidine (PEPCID) 20 MG tablet Take 1 tablet (20 mg total) by mouth 2 (two) times daily. 02/13/16   Charlesetta Shanks, MD  gabapentin (NEURONTIN) 100 MG capsule Take 1 capsule (100 mg total) by mouth at bedtime. 05/21/16   [name], DO  hydrALAZINE (APRESOLINE) 100 MG tablet Take 100 mg by mouth 2 (two) times daily.     Historical Provider, MD  isosorbide mononitrate (IMDUR) 60 MG 24 hr tablet Take 1 tablet (60 mg total) by mouth daily. 01/09/16   Smiley Houseman, MD  omeprazole (PRILOSEC) 20 MG capsule Take 20 mg by mouth daily. 07/01/13   Historical Provider, MD  ondansetron (ZOFRAN ODT) 4 MG disintegrating tablet Take 1 tablet (4 mg total) by mouth every 8 (eight) hours as needed for nausea or vomiting. 05/16/16   Sherwood Gambler, MD  oxycodone (OXY-IR) 5 MG capsule Take 1 capsule (5 mg total) by mouth every 4 (four) hours as needed for pain. 05/30/16   [name], MD  predniSONE (DELTASONE) 10 MG tablet Take 10 mg by mouth daily with breakfast.    Historical Provider, MD  sevelamer carbonate (RENVELA) 800 MG tablet Take 800-1,600 mg by mouth 3 (three) times daily with meals. 2 tabs three times daily with meals, and 1 tablet with snacks    Historical  Provider, MD  warfarin (COUMADIN) 2 MG tablet Take 2 mg by mouth daily. Take with 6m tablet to equal 769m7/12/17   Historical Provider, MD  warfarin (COUMADIN) 5 MG tablet Take 5 mg by mouth daily. Take with 70m33mablet to equal 7mg50m12/17   Historical Provider, MD    Family History Family History  Problem Relation Age of Onset  . Hypertension Other     Social History Social History  Substance Use Topics  . Smoking status: Former Smoker    Packs/day: 0.00    Years: 1.00    Types: Cigarettes  . Smokeless tobacco: Never Used     Comment: quit Jan 2014  .  Alcohol use No     Allergies   Butalbital-apap-caffeine; Ferrlecit [na ferric gluc cplx in sucrose]; Minoxidil; Darvocet [propoxyphene n-acetaminophen]; and Other   Review of Systems Review of Systems  Constitutional: Positive for diaphoresis.  HENT: Negative.   Respiratory: Negative.   Cardiovascular: Negative.   Gastrointestinal: Negative.   Genitourinary:       Anuric  Musculoskeletal: Negative.   Skin: Negative.   Allergic/Immunologic: Positive for immunocompromised state.       Hemodialysis patient  Neurological: Positive for numbness.       Tingling and numbness of left upper and left lower extremities  Psychiatric/Behavioral: Negative.      Physical Exam Updated Vital Signs Temp 98 F (36.7 C) (Oral)   Physical Exam  Constitutional: He is oriented to person, place, and time. He appears well-developed and well-nourished.  HENT:  Head: Normocephalic and atraumatic.  Eyes: Conjunctivae are normal. Pupils are equal, round, and reactive to light.  Neck: Neck supple. No tracheal deviation present. No thyromegaly present.  Cardiovascular: Normal rate and regular rhythm.   No murmur heard. Pulmonary/Chest: Effort normal and breath sounds normal. He exhibits no tenderness.  Dialysis catheter at right subclavian area  Abdominal: Soft. Bowel sounds are normal. He exhibits no distension. There is no tenderness.   Musculoskeletal: Normal range of motion. He exhibits no edema or tenderness.  Neurological: He is alert and oriented to person, place, and time. No cranial nerve deficit. Coordination normal.  Motor strength 5 over 5 overall pronator drift normal. DTR symmetric bilaterally knee jerk ankle jerk and biceps toes downward going bilaterally. Cranial nerves II through XII grossly intact  Skin: Skin is warm and dry. No rash noted.  Psychiatric:  Mildly anxious  Nursing note and vitals reviewed.    ED Treatments / Results  Labs (all labs ordered are listed, but only abnormal results are displayed) Labs Reviewed  ETHANOL  PROTIME-INR  APTT  CBC  DIFFERENTIAL  COMPREHENSIVE METABOLIC PANEL  I-STAT CHEM 8, ED  I-STAT TROPOININ, ED    EKG  EKG Interpretation  Date/Time:  Thursday May 30 2016 17:49:47 EST Ventricular Rate:  44 PR Interval:  172 QRS Duration: 86 QT Interval:  538 QTC Calculation: 459 R Axis:   -59 Text Interpretation:  Marked sinus bradycardia Left axis deviation ST & T wave abnormality, consider inferolateral ischemia Abnormal ECG Since last tracing rate slower Confirmed by Winfred Leeds  MD, Tashon Capp [phone]) on 05/30/2016 6:16:39 PM      Results for orders placed or performed during the hospital encounter of 05/30/16  Protime-INR  Result Value Ref Range   Prothrombin Time 17.1 (H) 11.4 - 15.2 seconds   INR 1.38   APTT  Result Value Ref Range   aPTT 28 24 - 36 seconds  CBC  Result Value Ref Range   WBC 6.4 4.0 - 10.5 K/uL   RBC 2.92 (L) 4.22 - 5.81 MIL/uL   Hemoglobin 8.2 (L) 13.0 - 17.0 g/dL   HCT 26.3 (L) 39.0 - 52.0 %   MCV 90.1 78.0 - 100.0 fL   MCH 28.1 26.0 - 34.0 pg   MCHC 31.2 30.0 - 36.0 g/dL   RDW 16.4 (H) 11.5 - 15.5 %   Platelets 148 (L) 150 - 400 K/uL  Differential  Result Value Ref Range   Neutrophils Relative % 81 %   Neutro Abs 5.2 1.7 - 7.7 K/uL   Lymphocytes Relative 10 %   Lymphs Abs 0.6 (L) 0.7 - 4.0 K/uL   Monocytes  Relative 9 %    Monocytes Absolute 0.5 0.1 - 1.0 K/uL   Eosinophils Relative 0 %   Eosinophils Absolute 0.0 0.0 - 0.7 K/uL   Basophils Relative 0 %   Basophils Absolute 0.0 0.0 - 0.1 K/uL  Comprehensive metabolic panel  Result Value Ref Range   Sodium 134 (L) 135 - 145 mmol/L   Potassium 6.3 (HH) 3.5 - 5.1 mmol/L   Chloride 98 (L) 101 - 111 mmol/L   CO2 26 22 - 32 mmol/L   Glucose, Bld 106 (H) 65 - 99 mg/dL   BUN 28 (H) 6 - 20 mg/dL   Creatinine, Ser 9.34 (H) 0.61 - 1.24 mg/dL   Calcium 8.0 (L) 8.9 - 10.3 mg/dL   Total Protein 7.1 6.5 - 8.1 g/dL   Albumin 3.1 (L) 3.5 - 5.0 g/dL   AST 30 15 - 41 U/L   ALT 13 (L) 17 - 63 U/L   Alkaline Phosphatase 111 38 - 126 U/L   Total Bilirubin 0.5 0.3 - 1.2 mg/dL   GFR calc non Af Amer 6 (L) >60 mL/min   GFR calc Af Amer 7 (L) >60 mL/min   Anion gap 10 5 - 15  I-Stat Chem 8, ED  (not at [facility], [facility])  Result Value Ref Range   Sodium 134 (L) 135 - 145 mmol/L   Potassium 6.4 (HH) 3.5 - 5.1 mmol/L   Chloride 98 (L) 101 - 111 mmol/L   BUN 34 (H) 6 - 20 mg/dL   Creatinine, Ser 10.20 (H) 0.61 - 1.24 mg/dL   Glucose, Bld 97 65 - 99 mg/dL   Calcium, Ion 0.86 (LL) 1.15 - 1.40 mmol/L   TCO2 28 0 - 100 mmol/L   Hemoglobin 9.2 (L) 13.0 - 17.0 g/dL   HCT 27.0 (L) 39.0 - 52.0 %   Comment NOTIFIED PHYSICIAN   I-stat troponin, ED (not at [facility], [facility])  Result Value Ref Range   Troponin i, poc 0.03 0.00 - 0.08 ng/mL   Comment 3          I-stat troponin, ED  Result Value Ref Range   Troponin i, poc 0.01 0.00 - 0.08 ng/mL   Comment 3           Dg Chest 2 View  Result Date: 05/18/2016 CLINICAL DATA:  Dyspnea with fatigue. Low back pain and dizziness after dialysis. EXAM: CHEST  2 VIEW COMPARISON:  05/14/2016 CXR and FINDINGS: Right dialysis catheter tip is seen in the distal SVC. Aortic atherosclerosis without aneurysm is again noted. The cardiac silhouette is top-normal in size allowing for portable technique. Lungs are free of pneumonic consolidations. No effusions or  pulmonary edema. No pneumothorax. No suspicious osseous abnormalities. IMPRESSION: No acute cardiopulmonary disease. Electronically Signed   By: Ashley Royalty M.D.   On: 05/18/2016 00:23   Dg Chest Port 1 View  Result Date: 05/14/2016 CLINICAL DATA:  Acute onset of shortness of breath which began yesterday. Current history of end-stage renal disease on hemodialysis. EXAM: PORTABLE CHEST 1 VIEW COMPARISON:  03/31/2016, 03/11/2016 and earlier. FINDINGS: Cardiac silhouette markedly enlarged, unchanged. Mild pulmonary venous hypertension without overt edema currently. No confluent airspace consolidation. No visible pleural effusions. Right jugular dialysis catheter tip projects over expected location of a cavoatrial junction, unchanged. IMPRESSION: Stable marked cardiomegaly. Pulmonary venous hypertension without overt edema currently. No acute cardiopulmonary disease. Electronically Signed   By: Evangeline Dakin M.D.   On: 05/14/2016 16:44   Mr Brain Ltd Wo Contrast  Result  Date: 05/30/2016 CLINICAL DATA:  Initial evaluation for left-sided numbness. EXAM: MRI HEAD WITHOUT CONTRAST TECHNIQUE: Multiplanar, multiecho pulse sequences of the brain and surrounding structures were obtained without intravenous contrast. COMPARISON:  Prior CT from 05/05/2014. FINDINGS: Brain: Cerebral volume within normal limits. Minimal T2/FLAIR hyperintensity within the periventricular white matter, most likely related chronic small vessel ischemic disease, felt to be within normal limits for age. No abnormal foci of restricted diffusion to suggest acute or subacute ischemia. Gray-white matter differentiation maintained. No evidence for acute or chronic intracranial hemorrhage. No encephalomalacia to suggest chronic infarction. No mass lesion, midline shift, or mass effect. No hydrocephalus. No extra-axial fluid collection. Major dural sinuses are grossly patent. Pituitary gland and suprasellar region within normal limits. Vascular:  Major intracranial vascular flow voids are maintained. Skull and upper cervical spine: Craniocervical junction normal. Visualized upper cervical spine unremarkable without significant degenerative changes are stenosis. Bone marrow signal intensity within normal limits. No scalp soft tissue abnormality. Sinuses/Orbits: Globes and orbital soft tissues within normal limits. Moderate mucosal thickening within the maxillary sinuses and ethmoidal air cells. Paranasal sinuses are otherwise clear. No mastoid effusion. Inner ear structures grossly normal. IMPRESSION: 1. Normal brain MRI. No acute intracranial infarct or other process identified. 2. Moderate maxillary and ethmoidal sinus disease, likely allergic/inflammatory in nature. Electronically Signed   By: Jeannine Boga M.D.   On: 05/30/2016 22:03    Radiology No results found.  Procedures Procedures (including critical care time)  Medications Ordered in ED Medications - No data to display   Initial Impression / Assessment and Plan / ED Course  I have reviewed the triage vital signs and the nursing notes. 11:05 PM patient is alert and in no distress. Complains of slight tingling in fingers of left hand. Noted to be hypokalemic. Hyperkalemia may be contributory to transient bradycardia I consulted nephrology service Dr. Joelyn Oms will arrange for hemodialysis in the morning. Initial EKG showed bradycardia however he remained in normal sinus rhythm throughout his ED stay. Intravenous D50, calcium, and insulin ordered. Dr. Loleta Books from hospital service consulted and will see patient in the ED and arrange for overnight stay. Pertinent labs & imaging results that were available during my care of the patient were reviewed by me and considered in my medical decision making (see chart for details).  Clinical Course     Strongly doubt stroke, negative MRI, nonfocal neurologic exam. Anemia is chronic  Final Clinical Impressions(s) / ED Diagnoses    Diagnoses #1 hyperkalemia #2 paresthesias Final diagnoses:  None  #3anemia CRITICAL CARE Performed by: Orlie Dakin Total critical care time: 30 minutes Critical care time was exclusive of separately billable procedures and treating other patients. Critical care was necessary to treat or prevent imminent or life-threatening deterioration. Critical care was time spent personally by me on the following activities: development of treatment plan with patient and/or surrogate as well as nursing, discussions with consultants, evaluation of patient's response to treatment, examination of patient, obtaining history from patient or surrogate, ordering and performing treatments and interventions, ordering and review of laboratory studies, ordering and review of radiographic studies, pulse oximetry and re-evaluation of patient's condition. New Prescriptions New Prescriptions   No medications on file     Orlie Dakin, MD 05/30/16 [place], MD 05/30/16 [id number]

## 2016-05-30 NOTE — ED Provider Notes (Signed)
CSN: [id number]  Arrival date & time: 05/30/16 1743  History   Chief Complaint     Chief Complaint  Patient presents with  . Weakness   HPI  LENDELL GALLICK is a 50 y.o. male.  HPI complains of tingling which started in his left arm followed by tingling in his left leg onset 4 PM today. He denies any difficulty speaking any difficulty moving his extremities. He reports that he did become sweaty for several minutes which resolved. He denies pain anywhere. Patient was discharged from overnight stay here earlier this afternoon. Tingling is improving with time.. Last hemodialysis session yesterday. Denies chest pain denies fever denies headache denies shortness of breath no nausea or vomiting no other associated symptoms. No treatment prior to coming here       Past Medical History:  Diagnosis Date  . Anemia   . Anxiety   . Chronic combined systolic and diastolic CHF (congestive heart failure) (HCC)    a. EF 20-25% by echo in 08/2015 b. echo 10/2015: EF 35-40%, diffuse HK, severe LAE, moderate RAE, small pericardial effusion  . Complication of anesthesia    itching, sore throat  . Depression   . Dialysis patient ([place])   . ESRD (end stage renal disease) ([place])    due to HTN per patient, followed at [facility], s/p failed kidney transplant - dialysis Tue, Th, Sat  . Hyperkalemia 12/2015  . Hypertension   . Junctional rhythm    a. noted in 08/2015: hyperkalemic at that time b. 12/2015: presented in junctional rhythm w/ K+ of 6.6. Resolved with improvement of K+ levels.  . Nonischemic cardiomyopathy ([place])    a. 08/2014: cath showing minimal CAD, but tortuous arteries noted.   . Personal history of DVT (deep vein thrombosis)/ PE 05/26/2016   In Oct 2015 had small subsemental LUL PE w/o DVT (LE dopplers neg) and was felt to be HD cath related, treated w coumadin. IN May 2016 had small vein DVT (acute/subacute) in the R basilic/ brachial veins of the RUE, resumed on coumadin. Had R sided HD cath at that  time.   . Renal insufficiency   . Shortness of breath   . Type II diabetes mellitus (HCC)    No history per patient, but remains under history as A1c would not be accurate given on dialysis       Patient Active Problem List   Diagnosis Date Noted  . Hyperkalemia 05/29/2016  . Chronic back pain 05/29/2016  . GERD (gastroesophageal reflux disease) 05/29/2016  . Personal history of DVT (deep vein thrombosis)/ PE 05/26/2016  . Nonischemic cardiomyopathy ([place]) 01/09/2016  . Bilateral low back pain without sciatica   . Left renal mass 10/30/2015  . Constipation 10/30/2015  . Adjustment disorder with mixed anxiety and depressed mood 08/20/2015  . Chronic epididymitis 06/19/2015  . Groin pain, chronic, right 06/19/2015  . Incarcerated right inguinal hernia 02/16/2015  . Essential hypertension 01/02/2015  . Dyslipidemia   . ESRD on hemodialysis ([place])   . Anemia of chronic kidney failure 06/24/2013        Past Surgical History:  Procedure Laterality Date  . CAPD INSERTION    . CAPD REMOVAL    . INGUINAL HERNIA REPAIR Right 02/14/2015   Procedure: REPAIR INCARCERATED RIGHT INGUINAL HERNIA; Surgeon: [name], MD; Location: [facility]; Service: General; Laterality: Right;  . INSERTION OF DIALYSIS CATHETER Right 09/23/2015   Procedure: exchange of Right internal Dialysis Catheter.; Surgeon: [name], MD; Location: [facility]; Service:  Vascular; Laterality: Right;  . KIDNEY RECEIPIENT  2006   failed and started HD in March 2014  . LEFT HEART CATHETERIZATION WITH CORONARY ANGIOGRAM N/A 09/02/2014   Procedure: LEFT HEART CATHETERIZATION WITH CORONARY ANGIOGRAM; Surgeon: [name], MD; Location: [facility] CATH LAB; Service: Cardiovascular; Laterality: N/A;    Home Medications           Prior to Admission medications   Medication Sig Start Date End Date Taking? Authorizing Provider  allopurinol (ZYLOPRIM) 100 MG tablet Take 100 mg by mouth daily.    Historical Provider, MD  amLODipine  (NORVASC) 10 MG tablet Take 10 mg by mouth daily.    Historical Provider, MD  atorvastatin (LIPITOR) 40 MG tablet Take 1 tablet (40 mg total) by mouth daily at 6 PM. 09/02/14   Barton Dubois, MD  AURYXIA 1 GM 210 MG(Fe) TABS Take 420 mg by mouth 3 (three) times daily. 01/26/16   Historical Provider, MD  carvedilol (COREG) 25 MG tablet Take 25 mg by mouth 2 (two) times daily with a meal.    Historical Provider, MD  cinacalcet (SENSIPAR) 30 MG tablet Take 30 mg by mouth daily.    Historical Provider, MD  cloNIDine (CATAPRES - DOSED IN MG/24 HR) 0.3 mg/24hr patch Place 0.3 mg onto the skin once a week.    Historical Provider, MD  doxazosin (CARDURA) 2 MG tablet Take 2 mg by mouth daily.    Historical Provider, MD  famotidine (PEPCID) 20 MG tablet Take 1 tablet (20 mg total) by mouth 2 (two) times daily. 02/13/16   Charlesetta Shanks, MD  gabapentin (NEURONTIN) 100 MG capsule Take 1 capsule (100 mg total) by mouth at bedtime. 05/21/16   [name], DO  hydrALAZINE (APRESOLINE) 100 MG tablet Take 100 mg by mouth 2 (two) times daily.     Historical Provider, MD  isosorbide mononitrate (IMDUR) 60 MG 24 hr tablet Take 1 tablet (60 mg total) by mouth daily. 01/09/16   Smiley Houseman, MD  omeprazole (PRILOSEC) 20 MG capsule Take 20 mg by mouth daily. 07/01/13   Historical Provider, MD  ondansetron (ZOFRAN ODT) 4 MG disintegrating tablet Take 1 tablet (4 mg total) by mouth every 8 (eight) hours as needed for nausea or vomiting. 05/16/16   Sherwood Gambler, MD  oxycodone (OXY-IR) 5 MG capsule Take 1 capsule (5 mg total) by mouth every 4 (four) hours as needed for pain. 05/30/16   [name], MD  predniSONE (DELTASONE) 10 MG tablet Take 10 mg by mouth daily with breakfast.    Historical Provider, MD  sevelamer carbonate (RENVELA) 800 MG tablet Take 800-1,600 mg by mouth 3 (three) times daily with meals. 2 tabs three times daily with meals, and 1 tablet with snacks    Historical Provider, MD  warfarin (COUMADIN) 2  MG tablet Take 2 mg by mouth daily. Take with 93m tablet to equal 778m7/12/17   Historical Provider, MD  warfarin (COUMADIN) 5 MG tablet Take 5 mg by mouth daily. Take with 30m15mablet to equal 7mg18m12/17   Historical Provider, MD   Family History       Family History  Problem Relation Age of Onset  . Hypertension Other    Social History        Social History  Substance Use Topics  . Smoking status: Former Smoker    Packs/day: 0.00    Years: 1.00    Types: Cigarettes  . Smokeless tobacco: Never Used  Comment: quit Jan 2014  . Alcohol use No   Allergies   Butalbital-apap-caffeine; Ferrlecit [na ferric gluc cplx in sucrose]; Minoxidil; Darvocet [propoxyphene n-acetaminophen]; and Other  Review of Systems  Review of Systems  Constitutional: Positive for diaphoresis.  HENT: Negative.  Respiratory: Negative.  Cardiovascular: Negative.  Gastrointestinal: Negative.  Genitourinary:  Anuric  Musculoskeletal: Negative.  Skin: Negative.  Allergic/Immunologic: Positive for immunocompromised state.  Hemodialysis patient  Neurological: Positive for numbness.  Tingling and numbness of left upper and left lower extremities  Psychiatric/Behavioral: Negative.   Physical Exam  Updated Vital Signs  Temp 98 F (36.7 C) (Oral)  Physical Exam  Constitutional: He is oriented to person, place, and time. He appears well-developed and well-nourished.  HENT:  Head: Normocephalic and atraumatic.  Eyes: Conjunctivae are normal. Pupils are equal, round, and reactive to light.  Neck: Neck supple. No tracheal deviation present. No thyromegaly present.  Cardiovascular: Normal rate and regular rhythm.  No murmur heard.  Pulmonary/Chest: Effort normal and breath sounds normal. He exhibits no tenderness.  Dialysis catheter at right subclavian area  Abdominal: Soft. Bowel sounds are normal. He exhibits no distension. There is no tenderness.  Musculoskeletal: Normal range of motion. He exhibits no  edema or tenderness.  Neurological: He is alert and oriented to person, place, and time. No cranial nerve deficit. Coordination normal.  Motor strength 5 over 5 overall pronator drift normal. DTR symmetric bilaterally knee jerk ankle jerk and biceps toes downward going bilaterally. Cranial nerves II through XII grossly intact  Skin: Skin is warm and dry. No rash noted.  Psychiatric:  Mildly anxious  Nursing note and vitals reviewed.   ED Treatments / Results  Labs  (all labs ordered are listed, but only abnormal results are displayed)  Labs Reviewed  ETHANOL  PROTIME-INR  APTT  CBC  DIFFERENTIAL  COMPREHENSIVE METABOLIC PANEL  I-STAT CHEM 8, ED  I-STAT TROPOININ, ED   EKG       EKG Interpretation  Date/Time: Thursday May 30 2016 17:49:47 EST  Ventricular Rate: 44  PR Interval: 172  QRS Duration: 86  QT Interval: 538  QTC Calculation: 459  R Axis: -59  Text Interpretation: Marked sinus bradycardia Left axis deviation ST & T wave abnormality, consider inferolateral ischemia Abnormal ECG Since last tracing rate slower Confirmed by Winfred Leeds MD, Keeven Matty [phone]) on 05/30/2016 6:16:39 PM            Results for orders placed or performed during the hospital encounter of 05/30/16  Protime-INR  Result Value Ref Range   Prothrombin Time 17.1 (H) 11.4 - 15.2 seconds   INR 1.38   APTT  Result Value Ref Range   aPTT 28 24 - 36 seconds  CBC  Result Value Ref Range   WBC 6.4 4.0 - 10.5 K/uL   RBC 2.92 (L) 4.22 - 5.81 MIL/uL   Hemoglobin 8.2 (L) 13.0 - 17.0 g/dL   HCT 26.3 (L) 39.0 - 52.0 %   MCV 90.1 78.0 - 100.0 fL   MCH 28.1 26.0 - 34.0 pg   MCHC 31.2 30.0 - 36.0 g/dL   RDW 16.4 (H) 11.5 - 15.5 %   Platelets 148 (L) 150 - 400 K/uL  Differential  Result Value Ref Range   Neutrophils Relative % 81 %   Neutro Abs 5.2 1.7 - 7.7 K/uL   Lymphocytes Relative 10 %   Lymphs Abs 0.6 (L) 0.7 - 4.0 K/uL   Monocytes Relative 9 %   Monocytes Absolute 0.5 0.1 -  1.0 K/uL    Eosinophils Relative 0 %   Eosinophils Absolute 0.0 0.0 - 0.7 K/uL   Basophils Relative 0 %   Basophils Absolute 0.0 0.0 - 0.1 K/uL  Comprehensive metabolic panel  Result Value Ref Range   Sodium 134 (L) 135 - 145 mmol/L   Potassium 6.3 (HH) 3.5 - 5.1 mmol/L   Chloride 98 (L) 101 - 111 mmol/L   CO2 26 22 - 32 mmol/L   Glucose, Bld 106 (H) 65 - 99 mg/dL   BUN 28 (H) 6 - 20 mg/dL   Creatinine, Ser 9.34 (H) 0.61 - 1.24 mg/dL   Calcium 8.0 (L) 8.9 - 10.3 mg/dL   Total Protein 7.1 6.5 - 8.1 g/dL   Albumin 3.1 (L) 3.5 - 5.0 g/dL   AST 30 15 - 41 U/L   ALT 13 (L) 17 - 63 U/L   Alkaline Phosphatase 111 38 - 126 U/L   Total Bilirubin 0.5 0.3 - 1.2 mg/dL   GFR calc non Af Amer 6 (L) >60 mL/min   GFR calc Af Amer 7 (L) >60 mL/min   Anion gap 10 5 - 15  I-Stat Chem 8, ED (not at [facility], [facility])  Result Value Ref Range   Sodium 134 (L) 135 - 145 mmol/L   Potassium 6.4 (HH) 3.5 - 5.1 mmol/L   Chloride 98 (L) 101 - 111 mmol/L   BUN 34 (H) 6 - 20 mg/dL   Creatinine, Ser 10.20 (H) 0.61 - 1.24 mg/dL   Glucose, Bld 97 65 - 99 mg/dL   Calcium, Ion 0.86 (LL) 1.15 - 1.40 mmol/L   TCO2 28 0 - 100 mmol/L   Hemoglobin 9.2 (L) 13.0 - 17.0 g/dL   HCT 27.0 (L) 39.0 - 52.0 %   Comment NOTIFIED PHYSICIAN   I-stat troponin, ED (not at [facility], [facility])  Result Value Ref Range   Troponin i, poc 0.03 0.00 - 0.08 ng/mL   Comment 3    I-stat troponin, ED  Result Value Ref Range   Troponin i, poc 0.01 0.00 - 0.08 ng/mL   Comment 3      Imaging Results     Radiology  Imaging Results (Last 48 hours)     Procedures  Procedures (including critical care time)  Medications Ordered in ED  Medications - No data to display  Initial Impression / Assessment and Plan / ED Course  I have reviewed the triage vital signs and the nursing notes.  11:05 PM patient is alert and in no distress. Complains of slight tingling in fingers of left hand. Noted to be hypokalemic. Hyperkalemia may be contributory to transient  bradycardia I consulted nephrology service Dr. Joelyn Oms will arrange for hemodialysis in the morning. Initial EKG showed bradycardia however he remained in normal sinus rhythm throughout his ED stay. Intravenous D50, calcium, and insulin ordered. Dr. Loleta Books from hospital service consulted and will see patient in the ED and arrange for overnight stay.  Pertinent labs & imaging results that were available during my care of the patient were reviewed by me and considered in my medical decision making (see chart for details).  Clinical Course    Strongly doubt stroke, negative MRI, nonfocal neurologic exam. Anemia is chronic  Final Clinical Impressions(s) / ED Diagnoses  Diagnoses #1 hyperkalemia  #2 paresthesias  Final diagnoses:  None  #3anemia  CRITICAL CARE Performed by: Orlie Dakin  Total critical care time: 30 minutes  Critical care time was exclusive of separately billable procedures and treating other  patients.  Critical care was necessary to treat or prevent imminent or life-threatening deterioration.  Critical care was time spent personally by me on the following activities: development of treatment plan with patient and/or surrogate as well as nursing, discussions with consultants, evaluation of patient's response to treatment, examination of patient, obtaining history from patient or surrogate, ordering and performing treatments and interventions, ordering and review of laboratory studies, ordering and review of radiographic studies, pulse oximetry and re-evaluation of patient's condition.  New Prescriptions     New Prescriptions   No medications on file      Orlie Dakin, MD 05/30/16 2344

## 2016-05-30 NOTE — Discharge Summary (Signed)
Physician Discharge Summary  Frank [name] ZOX:[id number] DOB: Jan 11, 1966 DOA: 05/28/2016  PCP: Angelica Chessman, MD  Admit date: 05/28/2016 Discharge date: 05/30/2016  Recommendations for Outpatient Follow-up:  1. No changes in medications on discharge  2. Please take coumadin 10 mg tonight 05/30/2016, then resume home regimen coumadin. Please recheck INR by Monday to make sure range is 2-3.  Discharge Diagnoses:  Principal Problem:   Hyperkalemia Active Problems:   ESRD on hemodialysis ([place])   Essential hypertension   Anemia of chronic kidney failure   Chronic back pain   GERD (gastroesophageal reflux disease)    Discharge Condition: stable   Diet recommendation: as tolerated   History of present illness:  Pt with history of HD on TTS, hypertension, dyslipidemia who presented to [facility] with need for dialysis. He was found to have hyperkalemia and on 12 lead EKG peaking T waves.  Hospital Course:  Assessment and Plan:  Hyperkalemia - Due to CKD - Given calcium gluconate, kayexalate and sodium bicarb - Had HD and potassium WNL  ESRD on HD - TTS - Management per renal  - Continue cinacalcet and sevelamer   Essential hypertension - Continue clonidine and hydralazine  History of DVT - Continue coumadin as noted, 10 mg tonight and then home regimen until Monday when pt instructed to recheck INR and PCP can adjust coumadin dose as needed to reflect INR 2-3   Signed:  Leisa Lenz, MD  [facility] 05/30/2016, 2:21 PM  Pager #: [phone]  Time spent in minutes: less than 30 minutes  Procedures:  HD  Consultations:  Renal   Discharge Exam: Vitals:   05/30/16 0900 05/30/16 1007  BP: (!) 170/91 (!) 184/111  Pulse: 72   Resp: 16   Temp: 97.9 F (36.6 C)    Vitals:   05/29/16 1740 05/29/16 1836 05/30/16 0900 05/30/16 1007  BP: (!) 178/101 109/80 (!) 170/91 (!) 184/111  Pulse: 77 81 72   Resp:  16 16   Temp:  98.5 F (36.9 C) 97.9 F (36.6 C)    TempSrc:  Oral Oral   SpO2:  100% 100%   Weight:      Height:        General: Pt is alert, follows commands appropriately, not in acute distress Cardiovascular: Regular rate and rhythm, S1/S2 +, no murmurs Respiratory: Clear to auscultation bilaterally, no wheezing, no crackles, no rhonchi Abdominal: Soft, non tender, non distended, bowel sounds +, no guarding Extremities: no edema, no cyanosis, pulses palpable bilaterally DP and PT Neuro: Grossly nonfocal  Discharge Instructions  Discharge Instructions    Call MD for:  persistant nausea and vomiting    Complete by:  As directed    Call MD for:  redness, tenderness, or signs of infection (pain, swelling, redness, odor or green/yellow discharge around incision site)    Complete by:  As directed    Call MD for:  severe uncontrolled pain    Complete by:  As directed    Diet - low sodium heart healthy    Complete by:  As directed    Discharge instructions    Complete by:  As directed    Please take coumadin 10 mg tonight 05/30/2016, then resume home regimen coumadin. Please recheck INR by Monday to make sure range is 2-3.   Increase activity slowly    Complete by:  As directed        Medication List    TAKE these medications   allopurinol 100 MG tablet Commonly  known as:  ZYLOPRIM Take 100 mg by mouth daily.   amLODipine 10 MG tablet Commonly known as:  NORVASC Take 10 mg by mouth daily.   atorvastatin 40 MG tablet Commonly known as:  LIPITOR Take 1 tablet (40 mg total) by mouth daily at 6 PM.   AURYXIA 1 GM 210 MG(Fe) Tabs Generic drug:  Ferric Citrate Take 420 mg by mouth 3 (three) times daily.   carvedilol 25 MG tablet Commonly known as:  COREG Take 25 mg by mouth 2 (two) times daily with a meal.   cinacalcet 30 MG tablet Commonly known as:  SENSIPAR Take 30 mg by mouth daily.   cloNIDine 0.3 mg/24hr patch Commonly known as:  CATAPRES - Dosed in mg/24 hr Place 0.3 mg onto the skin once a week.    doxazosin 2 MG tablet Commonly known as:  CARDURA Take 2 mg by mouth daily.   famotidine 20 MG tablet Commonly known as:  PEPCID Take 1 tablet (20 mg total) by mouth 2 (two) times daily.   gabapentin 100 MG capsule Commonly known as:  NEURONTIN Take 1 capsule (100 mg total) by mouth at bedtime.   hydrALAZINE 100 MG tablet Commonly known as:  APRESOLINE Take 100 mg by mouth 2 (two) times daily.   isosorbide mononitrate 60 MG 24 hr tablet Commonly known as:  IMDUR Take 1 tablet (60 mg total) by mouth daily.   omeprazole 20 MG capsule Commonly known as:  PRILOSEC Take 20 mg by mouth daily.   ondansetron 4 MG disintegrating tablet Commonly known as:  ZOFRAN ODT Take 1 tablet (4 mg total) by mouth every 8 (eight) hours as needed for nausea or vomiting.   oxycodone 5 MG capsule Commonly known as:  OXY-IR Take 1 capsule (5 mg total) by mouth every 4 (four) hours as needed for pain.   predniSONE 10 MG tablet Commonly known as:  DELTASONE Take 10 mg by mouth daily with breakfast.   sevelamer carbonate 800 MG tablet Commonly known as:  RENVELA Take 800-1,600 mg by mouth 3 (three) times daily with meals. 2 tabs three times daily with meals, and 1 tablet with snacks   warfarin 2 MG tablet Commonly known as:  COUMADIN Take 2 mg by mouth daily. Take with 30m tablet to equal 779m  warfarin 5 MG tablet Commonly known as:  COUMADIN Take 5 mg by mouth daily. Take with 62m29mablet to equal 7mg40m   Follow-up Information    JEGEDE, OLUGBEMIGA, MD. Schedule an appointment as soon as possible for a visit.   Specialty:  Internal Medicine Contact information: [phone]        The results of significant diagnostics from this hospitalization (including imaging, microbiology, ancillary and laboratory) are listed below for reference.    Significant Diagnostic Studies: Dg Chest 2 View  Result Date: 05/18/2016 CLINICAL DATA:   Dyspnea with fatigue. Low back pain and dizziness after dialysis. EXAM: CHEST  2 VIEW COMPARISON:  05/14/2016 CXR and FINDINGS: Right dialysis catheter tip is seen in the distal SVC. Aortic atherosclerosis without aneurysm is again noted. The cardiac silhouette is top-normal in size allowing for portable technique. Lungs are free of pneumonic consolidations. No effusions or pulmonary edema. No pneumothorax. No suspicious osseous abnormalities. IMPRESSION: No acute cardiopulmonary disease. Electronically Signed   By: DaviAshley Royalty.   On: 05/18/2016 00:23   [facility]  Result  Date: 05/14/2016 CLINICAL DATA:  Acute onset of shortness of breath which began yesterday. Current history of end-stage renal disease on hemodialysis. EXAM: PORTABLE CHEST 1 VIEW COMPARISON:  03/31/2016, 03/11/2016 and earlier. FINDINGS: Cardiac silhouette markedly enlarged, unchanged. Mild pulmonary venous hypertension without overt edema currently. No confluent airspace consolidation. No visible pleural effusions. Right jugular dialysis catheter tip projects over expected location of a cavoatrial junction, unchanged. IMPRESSION: Stable marked cardiomegaly. Pulmonary venous hypertension without overt edema currently. No acute cardiopulmonary disease. Electronically Signed   By: Evangeline Dakin M.D.   On: 05/14/2016 16:44    Microbiology: No results found for this or any previous visit (from the past 240 hour(s)).   Labs: Basic Metabolic Panel:  Recent Labs Lab 05/27/16 0712 05/27/16 1106 05/28/16 2310 05/28/16 2319 05/29/16 1350 05/30/16 0509  NA 138 138 140 141 138 138  K 6.3* 3.5 6.2* 6.1* 6.9* 4.6  CL 95* 96* 102 102 100* 99*  CO2 27  --  25  --  27 29  GLUCOSE 73 121* 119* 111* 129* 83  BUN 55* 17 37* 38* 44* 20  CREATININE 15.99* 5.80* 11.78* 11.70* 12.59* 7.71*  CALCIUM 8.4*  --  7.9*  --  8.1* 8.1*  PHOS 4.2  --   --   --  4.1  --    Liver Function Tests:  Recent Labs Lab 05/27/16 0712  05/28/16 2310 05/29/16 1350  AST  --  30  --   ALT  --  14*  --   ALKPHOS  --  126  --   BILITOT  --  0.4  --   PROT  --  6.9  --   ALBUMIN 2.7* 3.1* 3.0*   No results for input(s): LIPASE, AMYLASE in the last 168 hours. No results for input(s): AMMONIA in the last 168 hours. CBC:  Recent Labs Lab 05/26/16 0116 05/27/16 0711 05/27/16 1106 05/28/16 2310 05/28/16 2319  WBC  --  6.1  --  5.4  --   NEUTROABS  --   --   --  3.5  --   HGB 9.5* 7.6* 8.2* 7.8* 9.2*  HCT 28.0* 23.2* 24.0* 25.2* 27.0*  MCV  --  87.2  --  90.0  --   PLT  --  200  --  187  --    Cardiac Enzymes: No results for input(s): CKTOTAL, CKMB, CKMBINDEX, TROPONINI in the last 168 hours. BNP: BNP (last 3 results)  Recent Labs  07/17/15 2135 09/24/15 0324  BNP >4,500.0* >4,500.0*    ProBNP (last 3 results) No results for input(s): PROBNP in the last 8760 hours.  CBG: No results for input(s): GLUCAP in the last 168 hours.

## 2016-05-30 NOTE — ED Triage Notes (Signed)
Pt. D/C from hospital today. Went to his car and felt that his heart rate was low, drove to [employer] and they brout him here.

## 2016-05-30 NOTE — Progress Notes (Signed)
[name] to be D/C'd home per MD order.  Discussed prescriptions and follow up appointments with the patient. Prescriptions given to patient, medication list explained in detail. Pt verbalized understanding.    Medication List    TAKE these medications   allopurinol 100 MG tablet Commonly known as:  ZYLOPRIM Take 100 mg by mouth daily.   amLODipine 10 MG tablet Commonly known as:  NORVASC Take 10 mg by mouth daily.   atorvastatin 40 MG tablet Commonly known as:  LIPITOR Take 1 tablet (40 mg total) by mouth daily at 6 PM.   AURYXIA 1 GM 210 MG(Fe) Tabs Generic drug:  Ferric Citrate Take 420 mg by mouth 3 (three) times daily.   carvedilol 25 MG tablet Commonly known as:  COREG Take 25 mg by mouth 2 (two) times daily with a meal.   cinacalcet 30 MG tablet Commonly known as:  SENSIPAR Take 30 mg by mouth daily.   cloNIDine 0.3 mg/24hr patch Commonly known as:  CATAPRES - Dosed in mg/24 hr Place 0.3 mg onto the skin once a week.   doxazosin 2 MG tablet Commonly known as:  CARDURA Take 2 mg by mouth daily.   famotidine 20 MG tablet Commonly known as:  PEPCID Take 1 tablet (20 mg total) by mouth 2 (two) times daily.   gabapentin 100 MG capsule Commonly known as:  NEURONTIN Take 1 capsule (100 mg total) by mouth at bedtime.   hydrALAZINE 100 MG tablet Commonly known as:  APRESOLINE Take 100 mg by mouth 2 (two) times daily.   isosorbide mononitrate 60 MG 24 hr tablet Commonly known as:  IMDUR Take 1 tablet (60 mg total) by mouth daily.   omeprazole 20 MG capsule Commonly known as:  PRILOSEC Take 20 mg by mouth daily.   ondansetron 4 MG disintegrating tablet Commonly known as:  ZOFRAN ODT Take 1 tablet (4 mg total) by mouth every 8 (eight) hours as needed for nausea or vomiting.   oxycodone 5 MG capsule Commonly known as:  OXY-IR Take 1 capsule (5 mg total) by mouth every 4 (four) hours as needed for pain.   predniSONE 10 MG tablet Commonly known as:   DELTASONE Take 10 mg by mouth daily with breakfast.   sevelamer carbonate 800 MG tablet Commonly known as:  RENVELA Take 800-1,600 mg by mouth 3 (three) times daily with meals. 2 tabs three times daily with meals, and 1 tablet with snacks   warfarin 2 MG tablet Commonly known as:  COUMADIN Take 2 mg by mouth daily. Take with 61m tablet to equal 755m  warfarin 5 MG tablet Commonly known as:  COUMADIN Take 5 mg by mouth daily. Take with 6m24mablet to equal 7mg25m    Vitals:   05/30/16 0900 05/30/16 1007  BP: (!) 170/91 (!) 184/111  Pulse: 72   Resp: 16   Temp: 97.9 F (36.6 C)     Skin clean, dry and intact without evidence of skin break down, no evidence of skin tears noted. IV catheter discontinued intact. Site without signs and symptoms of complications. Dressing and pressure applied. Pt denies pain at this time. No complaints noted.  An After Visit Summary was printed and given to the patient. Patient escorted ambulatory, and D/C home via private auto.  AnisDixie Dials BSN

## 2016-05-30 NOTE — H&P (Signed)
History and Physical  Patient Name: Frank [name]     ZOX:[id number]    DOB: November 01, 1965    DOA: 05/30/2016 PCP: Angelica Chessman, MD   Patient coming from: Car  Chief Complaint: Weakness  HPI: Frank [name] is a 50 y.o. male with a past medical history significant forESRD on HD without HD center, failed renal transplant, chronic pain, recurrent DVT on warfarin, isch CM/CHF EF 35-40%, history of RCC scheduled for nephrectomy, and HTN who presents with weakness.  She was admitted 2 days ago for hyperkalemia, underwent hemodialysis yesterday afternoon. He was kept overnight last night (so that he can have dialysis again today he states), but today was told he did not need dialysis and to come back to the emergency room tomorrow, and so this afternoon around 3 PM he was discharged from the hospital.  He claims that he was able to walk as far as his car, but by the time he got his car he was feeling suddenly weak, almost unable to walk, nauseated, "like I was about to fall out", with severe tingling in his left hand and both feet. He was able to get into his car, and drove up to the [place] area where he stopped and had them take him down to the emergency room.  ED course: -Afebrile, heart rate initially in the 30s, respirations and pulse oximetry normal, blood pressure 152/98 -Na and 34, K 6.4, Cr 9.34, WBC 6.4K, Hgb 8.2 -ECG showed peaked T waves and bradycardia with normal QRS duration and new T wave inversions -The patient was given IV calcium, insulin and glucose and his heart rate improved -MRI brain negative for stroke -The case was discussed with Nephrology who recommended admission and dialysis, likely in the morning unless the patient became unstable overnight -TRH were asked to evaluate     ROS: Review of Systems  Constitutional: Positive for diaphoresis and malaise/fatigue.  Respiratory: Positive for shortness of breath.   Gastrointestinal: Positive for nausea.  Neurological:  Positive for dizziness and tingling.  All other systems reviewed and are negative.         Past Medical History:  Diagnosis Date  . Anemia   . Anxiety   . Chronic combined systolic and diastolic CHF (congestive heart failure) (HCC)    a. EF 20-25% by echo in 08/2015 b. echo 10/2015: EF 35-40%, diffuse HK, severe LAE, moderate RAE, small pericardial effusion  . Complication of anesthesia    itching, sore throat  . Depression   . Dialysis patient ([place])   . ESRD (end stage renal disease) ([place])    due to HTN per patient, followed at [facility], s/p failed kidney transplant - dialysis Tue, Th, Sat  . Hyperkalemia 12/2015  . Hypertension   . Junctional rhythm    a. noted in 08/2015: hyperkalemic at that time  b. 12/2015: presented in junctional rhythm w/ K+ of 6.6. Resolved with improvement of K+ levels.  . Nonischemic cardiomyopathy ([place])    a. 08/2014: cath showing minimal CAD, but tortuous arteries noted.   . Personal history of DVT (deep vein thrombosis)/ PE 05/26/2016   In Oct 2015 had small subsemental LUL PE w/o DVT (LE dopplers neg) and was felt to be HD cath related, treated w coumadin.  IN May 2016 had small vein DVT (acute/subacute) in the R basilic/ brachial veins of the RUE, resumed on coumadin.  Had R sided HD cath at that time.    . Renal insufficiency   . Shortness of  breath   . Type II diabetes mellitus (HCC)    No history per patient, but remains under history as A1c would not be accurate given on dialysis    Past Surgical History:  Procedure Laterality Date  . CAPD INSERTION    . CAPD REMOVAL    . INGUINAL HERNIA REPAIR Right 02/14/2015   Procedure: REPAIR INCARCERATED RIGHT INGUINAL HERNIA;  Surgeon: [name], MD;  Location: [facility];  Service: General;  Laterality: Right;  . INSERTION OF DIALYSIS CATHETER Right 09/23/2015   Procedure: exchange of Right internal Dialysis Catheter.;  Surgeon: [name], MD;  Location: [facility];  Service: Vascular;  Laterality: Right;    . KIDNEY RECEIPIENT  2006   failed and started HD in March 2014  . LEFT HEART CATHETERIZATION WITH CORONARY ANGIOGRAM N/A 09/02/2014   Procedure: LEFT HEART CATHETERIZATION WITH CORONARY ANGIOGRAM;  Surgeon: [name], MD;  Location: [facility] CATH LAB;  Service: Cardiovascular;  Laterality: N/A;    Social History: Patient lives aloneThe patient walks unassisted. He is a former Development worker, community. He is a former smoker.   Allergies  Allergen Reactions  . Butalbital-Apap-Caffeine Shortness Of Breath and Swelling    Swelling in throat  . Ferrlecit [Na Ferric Gluc Cplx In Sucrose] Shortness Of Breath, Swelling and Other (See Comments)    Swelling in throat  . Minoxidil Shortness Of Breath  . Darvocet [Propoxyphene N-Acetaminophen] Hives  . Other Hives    Allergy listed on file from [place] (Pt did not mention any allergies) Pt able to do norco (tylenol)    Family history: family history includes Hypertension in his other.  Prior to Admission medications   Medication Sig Start Date End Date Taking? Authorizing Provider  allopurinol (ZYLOPRIM) 100 MG tablet Take 100 mg by mouth daily.    Historical Provider, MD  amLODipine (NORVASC) 10 MG tablet Take 10 mg by mouth daily.    Historical Provider, MD  atorvastatin (LIPITOR) 40 MG tablet Take 1 tablet (40 mg total) by mouth daily at 6 PM. 09/02/14   Barton Dubois, MD  carvedilol (COREG) 25 MG tablet Take 25 mg by mouth 2 (two) times daily with a meal.    Historical Provider, MD  cinacalcet (SENSIPAR) 30 MG tablet Take 30 mg by mouth daily.    Historical Provider, MD  cloNIDine (CATAPRES - DOSED IN MG/24 HR) 0.3 mg/24hr patch Place 0.3 mg onto the skin once a week.    Historical Provider, MD  doxazosin (CARDURA) 2 MG tablet Take 2 mg by mouth daily.    Historical Provider, MD  famotidine (PEPCID) 20 MG tablet Take 1 tablet (20 mg total) by mouth 2 (two) times daily. 02/13/16   Charlesetta Shanks, MD  gabapentin (NEURONTIN) 100 MG capsule Take 1  capsule (100 mg total) by mouth at bedtime. 05/21/16   [name], DO  hydrALAZINE (APRESOLINE) 100 MG tablet Take 100 mg by mouth 2 (two) times daily.     Historical Provider, MD  isosorbide mononitrate (IMDUR) 60 MG 24 hr tablet Take 1 tablet (60 mg total) by mouth daily. 01/09/16   Smiley Houseman, MD  omeprazole (PRILOSEC) 20 MG capsule Take 20 mg by mouth daily. 07/01/13   Historical Provider, MD  ondansetron (ZOFRAN ODT) 4 MG disintegrating tablet Take 1 tablet (4 mg total) by mouth every 8 (eight) hours as needed for nausea or vomiting. 05/16/16   Sherwood Gambler, MD  oxycodone (OXY-IR) 5 MG capsule Take 1 capsule (5 mg total)  by mouth every 4 (four) hours as needed for pain. 05/30/16   [name], MD  predniSONE (DELTASONE) 10 MG tablet Take 10 mg by mouth daily with breakfast.    Historical Provider, MD  sevelamer carbonate (RENVELA) 800 MG tablet Take 800-1,600 mg by mouth 3 (three) times daily with meals. 2 tabs three times daily with meals, and 1 tablet with snacks    Historical Provider, MD  warfarin (COUMADIN) 2 MG tablet Take 2 mg by mouth daily. Take with 21m tablet to equal 746m7/12/17   Historical Provider, MD  warfarin (COUMADIN) 5 MG tablet Take 5 mg by mouth daily. Take with 3m54mablet to equal 7mg77m12/17   Historical Provider, MD       Physical Exam: BP 153/71   Pulse (!) 56   Temp 98 F (36.7 C) (Oral)   Resp 19   SpO2 (!) 89%  General appearance:Well-developed, adult male, alert and in moderate distress, laying down in bed, appears tired, uncomfortable.  Eyes:Anicteric, conjunctiva pink, lids and lashes normal. PERRL.  ENT:No nasal deformity, discharge, epistaxis. Hearing normal. OP moist without lesions.  Skin:Warm and dry. Severe xerosis.  Cardiac:RRR, nl S1-S2, no murmurs appreciated. Capillary refill is brisk. No JVD. No LE edema.  Respiratory:Normal respiratory rate and rhythm. CTAB without rales or wheezes. Abdomen:Abdomen soft.  NoTTP. No ascites, distension, hepatosplenomegaly.  MSK:No deformities or effusions. No cyanosis or clubbing.  Neuro:Cranial nerves normal. Sensation intact to light touch. Speech is fluent. Muscle strength dimiinished but symmetric.  Psych:Sensorium intact and responding to questions, attention normal. Behavior appropriate. Affect normal. Judgment and insight appear normal.     Labs on Admission:  I have personally reviewed following labs and imaging studies: CBC:  Recent Labs Lab 05/27/16 0711 05/27/16 1106 05/28/16 2310 05/28/16 2319 05/30/16 1945 05/30/16 2036  WBC 6.1  --  5.4  --  6.4  --   NEUTROABS  --   --  3.5  --  5.2  --   HGB 7.6* 8.2* 7.8* 9.2* 8.2* 9.2*  HCT 23.2* 24.0* 25.2* 27.0* 26.3* 27.0*  MCV 87.2  --  90.0  --  90.1  --   PLT 200  --  187  --  148*  --    Basic Metabolic Panel:  Recent Labs Lab 05/27/16 0712  05/28/16 2310 05/28/16 2319 05/29/16 1350 05/30/16 0509 05/30/16 1945 05/30/16 2036  NA 138  < > 140 141 138 138 134* 134*  K 6.3*  < > 6.2* 6.1* 6.9* 4.6 6.3* 6.4*  CL 95*  < > 102 102 100* 99* 98* 98*  CO2 27  --  25  --  _0 --   GLUCOSE 73  < > 119* 111* 129* 83 106* 97  BUN 55*  < > 37* 38* 44* 20 28* 34*  CREATININE 15.99*  < > 11.78* 11.70* 12.59* 7.71* 9.34* 10.20*  CALCIUM 8.4*  --  7.9*  --  8.1* 8.1* 8.0*  --   PHOS 4.2  --   --   --  4.1  --   --   --   < > = values in this interval not displayed. GFR: Estimated Creatinine Clearance: 8.7 mL/min (by C-G formula based on SCr of 10.2 mg/dL (H)).  Liver Function Tests:  Recent Labs Lab 05/27/16 0712 05/28/16 2310 05/29/16 1350 05/30/16 1945  AST  --  30  --  30  ALT  --  14*  --  13*  ALKPHOS  --  126  --  111  BILITOT  --  0.4  --  0.5  PROT  --  6.9  --  7.1  ALBUMIN 2.7* 3.1* 3.0* 3.1*   No results for input(s): LIPASE, AMYLASE in the last 168 hours. No results for input(s): AMMONIA in the last 168 hours. Coagulation Profile:  Recent  Labs Lab 05/28/16 2310 05/30/16 0509 05/30/16 1945  INR 1.16 1.29 1.38   Cardiac Enzymes: No results for input(s): CKTOTAL, CKMB, CKMBINDEX, TROPONINI in the last 168 hours. BNP (last 3 results) No results for input(s): PROBNP in the last 8760 hours. HbA1C: No results for input(s): HGBA1C in the last 72 hours. CBG: No results for input(s): GLUCAP in the last 168 hours. Lipid Profile: No results for input(s): CHOL, HDL, LDLCALC, TRIG, CHOLHDL, LDLDIRECT in the last 72 hours. Thyroid Function Tests: No results for input(s): TSH, T4TOTAL, FREET4, T3FREE, THYROIDAB in the last 72 hours. Anemia Panel:  Recent Labs  05/29/16 1350  TIBC 190*  IRON 41*   Sepsis Labs: Invalid input(s): PROCALCITONIN, LACTICIDVEN No results found for this or any previous visit (from the past 240 hour(s)).       Radiological Exams on Admission: Personally reviewed following report: Mr Brain Ltd Wo Contrast  Result Date: 05/30/2016 CLINICAL DATA:  Initial evaluation for left-sided numbness. EXAM: MRI HEAD WITHOUT CONTRAST TECHNIQUE: Multiplanar, multiecho pulse sequences of the brain and surrounding structures were obtained without intravenous contrast. COMPARISON:  Prior CT from 05/05/2014. FINDINGS: Brain: Cerebral volume within normal limits. Minimal T2/FLAIR hyperintensity within the periventricular white matter, most likely related chronic small vessel ischemic disease, felt to be within normal limits for age. No abnormal foci of restricted diffusion to suggest acute or subacute ischemia. Gray-white matter differentiation maintained. No evidence for acute or chronic intracranial hemorrhage. No encephalomalacia to suggest chronic infarction. No mass lesion, midline shift, or mass effect. No hydrocephalus. No extra-axial fluid collection. Major dural sinuses are grossly patent. Pituitary gland and suprasellar region within normal limits. Vascular: Major intracranial vascular flow voids are maintained.  Skull and upper cervical spine: Craniocervical junction normal. Visualized upper cervical spine unremarkable without significant degenerative changes are stenosis. Bone marrow signal intensity within normal limits. No scalp soft tissue abnormality. Sinuses/Orbits: Globes and orbital soft tissues within normal limits. Moderate mucosal thickening within the maxillary sinuses and ethmoidal air cells. Paranasal sinuses are otherwise clear. No mastoid effusion. Inner ear structures grossly normal. IMPRESSION: 1. Normal brain MRI. No acute intracranial infarct or other process identified. 2. Moderate maxillary and ethmoidal sinus disease, likely allergic/inflammatory in nature. Electronically Signed   By: Jeannine Boga M.D.   On: 05/30/2016 22:03    EKG: Independently reviewed. ECG shows rate 44, QTc 459, QRS normal, peaked T waves in precordial leads and TWI in lateral leads.  No ST changes.    Assessment/Plan  1. Hyperkalemia: With ECG changes. -Place in SDU and on telemetry -Repeat K overnight -Nephrology will dialyze in AM, sooner if clinical status changes    2. ESRD: -Continue Renvela and cinacalcet  3. HTN: -Continue Imdur, doxazosin, hydralazine, and amlodipine -Hold carvedilol, clonidine for now given bradycardia  4. CHF: EF 35-40%. Euvolemic -Hold BB  5. Chronic pain: Please see previous notes regarding outpatient opioids. Patient may have secondary gain from being in hospital. -May have oral oxycodone  6. Anemia of renal disease: Stable  7. History of DVT: -Continue warfarin, dosed per pharmacy  8. Failed renal transplant: -Continue prednisone 10 mg daily  9. Other medications: -Continue  PPI and H2RA -Continue allopurinol -Continue gabapentin        DVT prophylaxis: N/A on warfarin  Code Status: FULL  Family Communication: None present  Disposition Plan: Anticipate monitoring K overnight, HD in AM.   Consults called:  Nephrology Admission status: OBS         Medical decision making: Patient seen at 11:46 PM on 05/30/2016.  The patient was discussed with Dr. Winfred Leeds and Dr. Joelyn Oms.  What exists of the patient's chart was reviewed in depth and summarized above.  Clinical condition: BP and heart rate stable at present, will monitor closely in SDU.        [name] [facility] Pager [phone]

## 2016-05-30 NOTE — ED Notes (Signed)
Patient transported to MRI 

## 2016-05-30 NOTE — ED Notes (Signed)
Returned from MRI

## 2016-05-30 NOTE — Progress Notes (Signed)
ANTICOAGULATION CONSULT NOTE - Follow Up Consult  Pharmacy Consult for Warfarin Indication: Hx PE/DVT  Allergies  Allergen Reactions  . Butalbital-Apap-Caffeine Shortness Of Breath and Swelling    Swelling in throat  . Ferrlecit [Na Ferric Gluc Cplx In Sucrose] Shortness Of Breath, Swelling and Other (See Comments)    Swelling in throat  . Minoxidil Shortness Of Breath  . Darvocet [Propoxyphene N-Acetaminophen] Hives  . Other Hives    Allergy listed on file from [place] (Pt did not mention any allergies) Pt able to do norco (tylenol)    Patient Measurements: Height: _0  (188 cm) Weight: 159 lb 6.3 oz (72.3 kg) IBW/kg (Calculated) : 82.2  Vital Signs: Temp: 97.9 F (36.6 C) (11/09 0900) Temp Source: Oral (11/09 0900) BP: 184/111 (11/09 1007) Pulse Rate: 72 (11/09 0900)  Labs:  Recent Labs  05/28/16 2310 05/28/16 2319 05/29/16 1350 05/30/16 0509  HGB 7.8* 9.2*  --   --   HCT 25.2* 27.0*  --   --   PLT 187  --   --   --   LABPROT 14.9  --   --  16.2*  INR 1.16  --   --  1.29  CREATININE 11.78* 11.70* 12.59* 7.71*    Estimated Creatinine Clearance: 11.5 mL/min (by C-G formula based on SCr of 7.71 mg/dL (H)).   Assessment: 50 YOM with ESRD with no home HD unit who comes to dialyze frequently at [facility] and [place] was asked to resume warfarin from PTA for hx PE/DVT.  Per review of the notes - VTE episodes appear to be related to HD cath and per renal to consider stopping all anticoagulation once [facility] out and AVF matured. The patient is clearly noncompliant outpatient as the INR is always near baseline.   INR today remains SUBtherapeutic (INR 1.29 << 1.16, goal of 2-3). No CBC today - no overt s/sx of bleeding noted. Continues on Hep SQ for VTE prophylaxis while INR </= 1.8  Goal of Therapy:  INR 2-3   Plan:  1. Warfarin 10 mg x 1 dose at 1800 today 2. Will continue to monitor for any signs/symptoms of bleeding and will follow up with PT/INR in the  a.m.   Thank you for allowing pharmacy to be a part of this patient's care.  Alycia Rossetti, PharmD, BCPS Clinical Pharmacist Pager: [phone] Clinical phone for 05/30/2016 from 7a-3:30p: [phone] If after 3:30p, please call main pharmacy at: [phone] 05/30/2016 12:58 PM

## 2016-05-30 NOTE — Progress Notes (Signed)
Patient not allowing   NT to obtain VS _0  and 0500.

## 2016-05-30 NOTE — ED Notes (Signed)
Dr.Jacubowitz at bedside  

## 2016-05-30 NOTE — Progress Notes (Signed)
MEDICATION RELATED NOTE   Pharmacy Re:  Home Meds  Medications:  Prescriptions Prior to Admission  Medication Sig Dispense Refill Last Dose  . allopurinol (ZYLOPRIM) 100 MG tablet Take 100 mg by mouth daily.   Unknown at Unknown  . amLODipine (NORVASC) 10 MG tablet Take 10 mg by mouth daily.   Unknown at Unknown  . atorvastatin (LIPITOR) 40 MG tablet Take 1 tablet (40 mg total) by mouth daily at 6 PM. 30 tablet 1 Unknown at Unknown  . carvedilol (COREG) 25 MG tablet Take 25 mg by mouth 2 (two) times daily with a meal.   Unknown at Unknown  . cinacalcet (SENSIPAR) 30 MG tablet Take 30 mg by mouth daily.   Unknown at Unknown  . cloNIDine (CATAPRES - DOSED IN MG/24 HR) 0.3 mg/24hr patch Place 0.3 mg onto the skin once a week.   Unknown at Unknown  . doxazosin (CARDURA) 2 MG tablet Take 2 mg by mouth daily.   Unknown at Unknown  . gabapentin (NEURONTIN) 100 MG capsule Take 1 capsule (100 mg total) by mouth at bedtime.   Unknown at Unknown  . hydrALAZINE (APRESOLINE) 100 MG tablet Take 100 mg by mouth 2 (two) times daily.    Unknown at Unknown  . isosorbide mononitrate (IMDUR) 60 MG 24 hr tablet Take 1 tablet (60 mg total) by mouth daily.   Unknown at Unknown  . omeprazole (PRILOSEC) 20 MG capsule Take 20 mg by mouth daily.   Unknown at Unknown  . predniSONE (DELTASONE) 10 MG tablet Take 10 mg by mouth daily with breakfast.   Unknown at Unknown  . sevelamer carbonate (RENVELA) 800 MG tablet Take 800-1,600 mg by mouth 3 (three) times daily with meals. 2 tabs three times daily with meals, and 1 tablet with snacks   Unknown at Unknown  . AURYXIA 1 GM 210 MG(Fe) TABS Take 420 mg by mouth 3 (three) times daily.   05/24/2016 at Unknown time  . famotidine (PEPCID) 20 MG tablet Take 1 tablet (20 mg total) by mouth 2 (two) times daily. 30 tablet 0 05/24/2016 at Unknown time  . ondansetron (ZOFRAN ODT) 4 MG disintegrating tablet Take 1 tablet (4 mg total) by mouth every 8 (eight) hours as needed for nausea or  vomiting. 10 tablet 0 Unknown at Unknown  . warfarin (COUMADIN) 2 MG tablet Take 2 mg by mouth daily. Take with 26m tablet to equal 746m  05/23/2016 at 1800  . warfarin (COUMADIN) 5 MG tablet Take 5 mg by mouth daily. Take with 51m651mablet to equal 7mg66m11/08/2015 at 1800  . [DISCONTINUED] oxycodone (OXY-IR) 5 MG capsule Take 5 mg by mouth every 4 (four) hours as needed for pain.    Unknown at Unknown    Assessment: Several pharmacy staff have met with patient to review his home meds but he doesn't want to participate in this review.  We have entered as much information as we can from previous records including retail dispensing records.   Plan:  His record will be marked as complete but please take this information and resume those medications you feel are most appropriate for this patient.  JohnRober MinioneMiller9/2017,2:06 PM

## 2016-05-31 DIAGNOSIS — E875 Hyperkalemia: Secondary | ICD-10-CM

## 2016-05-31 LAB — BASIC METABOLIC PANEL
Anion gap: 11 (ref 5–15)
BUN: 33 mg/dL — ABNORMAL HIGH (ref 6–20)
CO2: 28 mmol/L (ref 22–32)
Calcium: 8.1 mg/dL — ABNORMAL LOW (ref 8.9–10.3)
Chloride: 96 mmol/L — ABNORMAL LOW (ref 101–111)
Creatinine, Ser: 11.05 mg/dL — ABNORMAL HIGH (ref 0.61–1.24)
GFR calc Af Amer: 5 mL/min — ABNORMAL LOW (ref >60)
GFR calc non Af Amer: 5 mL/min — ABNORMAL LOW (ref >60)
Glucose, Bld: 113 mg/dL — ABNORMAL HIGH (ref 65–99)
Potassium: 4.8 mmol/L (ref 3.5–5.1)
Sodium: 135 mmol/L (ref 135–145)

## 2016-05-31 LAB — CK TOTAL AND CKMB (NOT AT ARMC)
CK, MB: 2.9 ng/mL (ref 0.5–5.0)
RELATIVE INDEX: 2.8 — AB (ref 0.0–2.5)
Total CK: 104 U/L (ref 49–397)

## 2016-05-31 LAB — CBC
HEMATOCRIT: 24.9 % — AB (ref 39.0–52.0)
HEMOGLOBIN: 7.8 g/dL — AB (ref 13.0–17.0)
MCH: 28 pg (ref 26.0–34.0)
MCHC: 31.3 g/dL (ref 30.0–36.0)
MCV: 89.2 fL (ref 78.0–100.0)
Platelets: 147 10*3/uL — ABNORMAL LOW (ref 150–400)
RBC: 2.79 MIL/uL — ABNORMAL LOW (ref 4.22–5.81)
RDW: 16.4 % — AB (ref 11.5–15.5)
WBC: 5.8 10*3/uL (ref 4.0–10.5)

## 2016-05-31 LAB — TROPONIN I: Troponin I: 0.03 ng/mL (ref <0.03)

## 2016-05-31 LAB — POTASSIUM: Potassium: 5.3 mmol/L — ABNORMAL HIGH (ref 3.5–5.1)

## 2016-05-31 MED ORDER — HYDRALAZINE HCL 50 MG PO TABS
100.0000 mg | ORAL_TABLET | Freq: Two times a day (BID) | ORAL | Status: DC
  Administered 2016-05-31: 100 mg via ORAL
  Filled 2016-05-31: qty 2

## 2016-05-31 MED ORDER — ALLOPURINOL 100 MG PO TABS: 100.0000 mg | ORAL_TABLET | Freq: Every day | ORAL | Status: DC

## 2016-05-31 MED ORDER — ONDANSETRON 4 MG PO TBDP
4.0000 mg | ORAL_TABLET | Freq: Three times a day (TID) | ORAL | Status: DC | PRN
  Filled 2016-05-31: qty 1

## 2016-05-31 MED ORDER — SEVELAMER CARBONATE 800 MG PO TABS: 800.0000 mg | ORAL_TABLET | ORAL | Status: DC | PRN

## 2016-05-31 MED ORDER — OXYCODONE HCL 5 MG PO TABS
5.0000 mg | ORAL_TABLET | ORAL | Status: DC | PRN
  Administered 2016-05-31 (×2): 5 mg via ORAL
  Filled 2016-05-31 (×2): qty 1

## 2016-05-31 MED ORDER — SODIUM CHLORIDE 0.9 % IV SOLN: 100.0000 mL | INTRAVENOUS | Status: DC | PRN

## 2016-05-31 MED ORDER — CARVEDILOL 25 MG PO TABS: 25.0000 mg | ORAL_TABLET | Freq: Two times a day (BID) | ORAL | Status: DC

## 2016-05-31 MED ORDER — PENTAFLUOROPROP-TETRAFLUOROETH EX AERO: 1.0000 "application " | INHALATION_SPRAY | CUTANEOUS | Status: DC | PRN

## 2016-05-31 MED ORDER — CLONIDINE HCL 0.3 MG/24HR TD PTWK
0.3000 mg | MEDICATED_PATCH | TRANSDERMAL | Status: DC
  Filled 2016-05-31: qty 1

## 2016-05-31 MED ORDER — SODIUM CHLORIDE 0.9% FLUSH
3.0000 mL | Freq: Two times a day (BID) | INTRAVENOUS | Status: DC
  Administered 2016-05-31: 3 mL via INTRAVENOUS

## 2016-05-31 MED ORDER — FAMOTIDINE 20 MG PO TABS
20.0000 mg | ORAL_TABLET | Freq: Two times a day (BID) | ORAL | Status: DC
  Administered 2016-05-31: 20 mg via ORAL
  Filled 2016-05-31: qty 1

## 2016-05-31 MED ORDER — LIDOCAINE HCL (PF) 1 % IJ SOLN: 5.0000 mL | INTRAMUSCULAR | Status: DC | PRN

## 2016-05-31 MED ORDER — ATORVASTATIN CALCIUM 40 MG PO TABS: 40.0000 mg | ORAL_TABLET | Freq: Every day | ORAL | Status: DC

## 2016-05-31 MED ORDER — AMLODIPINE BESYLATE 10 MG PO TABS: 10.0000 mg | ORAL_TABLET | Freq: Every day | ORAL | Status: DC

## 2016-05-31 MED ORDER — LIDOCAINE-PRILOCAINE 2.5-2.5 % EX CREA: 1.0000 "application " | TOPICAL_CREAM | CUTANEOUS | Status: DC | PRN

## 2016-05-31 MED ORDER — PREDNISONE 10 MG PO TABS
10.0000 mg | ORAL_TABLET | Freq: Every day | ORAL | Status: DC
  Administered 2016-05-31: 10 mg via ORAL
  Filled 2016-05-31: qty 1

## 2016-05-31 MED ORDER — WARFARIN - PHARMACIST DOSING INPATIENT: Freq: Every day | Status: DC

## 2016-05-31 MED ORDER — HEPARIN SODIUM (PORCINE) 1000 UNIT/ML DIALYSIS
1000.0000 [IU] | INTRAMUSCULAR | Status: DC | PRN
  Filled 2016-05-31: qty 1

## 2016-05-31 MED ORDER — PANTOPRAZOLE SODIUM 40 MG PO TBEC: 40.0000 mg | DELAYED_RELEASE_TABLET | Freq: Every day | ORAL | Status: DC

## 2016-05-31 MED ORDER — ISOSORBIDE MONONITRATE ER 30 MG PO TB24: 60.0000 mg | ORAL_TABLET | Freq: Every day | ORAL | Status: DC

## 2016-05-31 MED ORDER — WARFARIN SODIUM 1 MG PO TABS
7.0000 mg | ORAL_TABLET | Freq: Once | ORAL | Status: AC
  Administered 2016-05-31: 7 mg via ORAL
  Filled 2016-05-31: qty 1

## 2016-05-31 MED ORDER — SEVELAMER CARBONATE 800 MG PO TABS: 800.0000 mg | ORAL_TABLET | Freq: Three times a day (TID) | ORAL | Status: DC

## 2016-05-31 MED ORDER — SEVELAMER CARBONATE 800 MG PO TABS
1600.0000 mg | ORAL_TABLET | Freq: Three times a day (TID) | ORAL | Status: DC
  Administered 2016-05-31: 1600 mg via ORAL
  Filled 2016-05-31: qty 2

## 2016-05-31 MED ORDER — CINACALCET HCL 30 MG PO TABS
30.0000 mg | ORAL_TABLET | Freq: Every day | ORAL | Status: DC
  Administered 2016-05-31: 30 mg via ORAL
  Filled 2016-05-31: qty 1

## 2016-05-31 MED ORDER — GABAPENTIN 100 MG PO CAPS
100.0000 mg | ORAL_CAPSULE | Freq: Every day | ORAL | Status: DC
  Administered 2016-05-31: 100 mg via ORAL
  Filled 2016-05-31: qty 1

## 2016-05-31 MED ORDER — ALTEPLASE 2 MG IJ SOLR: 2.0000 mg | Freq: Once | INTRAMUSCULAR | Status: DC | PRN

## 2016-05-31 MED ORDER — DOXAZOSIN MESYLATE 2 MG PO TABS
2.0000 mg | ORAL_TABLET | Freq: Every day | ORAL | Status: DC
  Filled 2016-05-31: qty 1

## 2016-05-31 NOTE — Consult Note (Signed)
Pt admitted in observation status  Was inpatient until yesterday. Upon leaving [facility] had weakness and returned to ED. Found to have sinus bradycardia and K 6.4, improved to 5.3 with medical therapy.  HR has normalized in 70s.  BP stable/elevated.  Plan for HD this AM: 2K bath, 4h, 3L UF, 400/800, 2.5 Ca, no heparin.

## 2016-05-31 NOTE — Progress Notes (Signed)
CRITICAL VALUE ALERT  Critical value received:  Troponin 0.03  Date of notification:  05/31/16  Time of notification:  0952  Critical value read back:Yes.    Nurse who received alert:  Nyoka Lint   MD notified (1st page):  Dr. Posey Pronto  Time of first page: 1012  Responding MD:  Dr. Posey Pronto  Time MD responded: 1035  No new orders

## 2016-05-31 NOTE — Progress Notes (Signed)
ANTICOAGULATION CONSULT NOTE - Initial Consult  Pharmacy Consult for Warfarin  Indication: Hx PE/DVT  Vital Signs: Temp: 97.9 F (36.6 C) (11/10 0123) Temp Source: Oral (11/10 0123) BP: 170/89 (11/10 0123) Pulse Rate: 29 (11/10 0000)  Labs:  Recent Labs  05/28/16 2310 05/28/16 2319  05/30/16 0509 05/30/16 1945 05/30/16 2036  HGB 7.8* 9.2*  --   --  8.2* 9.2*  HCT 25.2* 27.0*  --   --  26.3* 27.0*  PLT 187  --   --   --  148*  --   APTT  --   --   --   --  28  --   LABPROT 14.9  --   --  16.2* 17.1*  --   INR 1.16  --   --  1.29 1.38  --   CREATININE 11.78* 11.70*  < > 7.71* 9.34* 10.20*  < > = values in this interval not displayed.  Estimated Creatinine Clearance: 8.7 mL/min (by C-G formula based on SCr of 10.2 mg/dL (H)).   Medical History: Past Medical History:  Diagnosis Date  . Anemia   . Anxiety   . Chronic combined systolic and diastolic CHF (congestive heart failure) (HCC)    a. EF 20-25% by echo in 08/2015 b. echo 10/2015: EF 35-40%, diffuse HK, severe LAE, moderate RAE, small pericardial effusion  . Complication of anesthesia    itching, sore throat  . Depression   . Dialysis patient ([place])   . ESRD (end stage renal disease) ([place])    due to HTN per patient, followed at [facility], s/p failed kidney transplant - dialysis Tue, Th, Sat  . Hyperkalemia 12/2015  . Hypertension   . Junctional rhythm    a. noted in 08/2015: hyperkalemic at that time  b. 12/2015: presented in junctional rhythm w/ K+ of 6.6. Resolved with improvement of K+ levels.  . Nonischemic cardiomyopathy ([place])    a. 08/2014: cath showing minimal CAD, but tortuous arteries noted.   . Personal history of DVT (deep vein thrombosis)/ PE 05/26/2016   In Oct 2015 had small subsemental LUL PE w/o DVT (LE dopplers neg) and was felt to be HD cath related, treated w coumadin.  IN May 2016 had small vein DVT (acute/subacute) in the R basilic/ brachial veins of the RUE, resumed on coumadin.  Had R sided HD  cath at that time.    . Renal insufficiency   . Shortness of breath   . Type II diabetes mellitus (HCC)    No history per patient, but remains under history as A1c would not be accurate given on dialysis     Assessment: 50 y/o M just discharged this past evening and came directly back to the ED with left arm tingling , warfarin PTA for hx DVT/PE, INR low up to 1.38 (from 1.16) after receiving a total of 10 mg of warfarin on 11/8.   Goal of Therapy:  INR 2-3 Monitor platelets by anticoagulation protocol: Yes   Plan:  -Warfarin 21m PO x 1 now -Daily PT/INR -Monitor for bleeding  LNarda Bonds11/04/2016,1:28 AM

## 2016-05-31 NOTE — Progress Notes (Signed)
Discharge Note:  Patient alert and oriented X 4 and in no distress. He was given discharge instructions regarding signs and symptoms to report, medications, diet, activity, and upcoming appointments.  He verbalized understanding of all instructions.  Peripheral IV removed. Patient ambulated out without assistance.

## 2016-05-31 NOTE — Procedures (Signed)
  Pt had full 4 hour HD treatment Pre HD K was 4.8 Post HD weight was 73.7 kg with 3.5 liters removed. Access [facility]  Jamal Maes, MD [facility] [phone] Pager 05/31/2016, 1:26 PM

## 2016-05-31 NOTE — Progress Notes (Signed)
Pt refused skin assessment, and CHG bath.

## 2016-05-31 NOTE — Care Management Obs Status (Signed)
[place] NOTIFICATION   Patient Details  Name: AMAAN MEYER MRN: [id number] Date of Birth: 07/13/1966   Medicare Observation Status Notification Given:  Yes    Sadao Weyer T, RN 05/31/2016, 1:25 PM

## 2016-06-01 IMAGING — CR DG CHEST 2V
2 series · 2 of 2 positions shown · non-contrast
Comparison: 01/02/2014

CLINICAL DATA: Chest pain today, history hypertension, end-stage
renal disease, diabetes

EXAM:
CHEST  2 VIEW

[w chest pa]
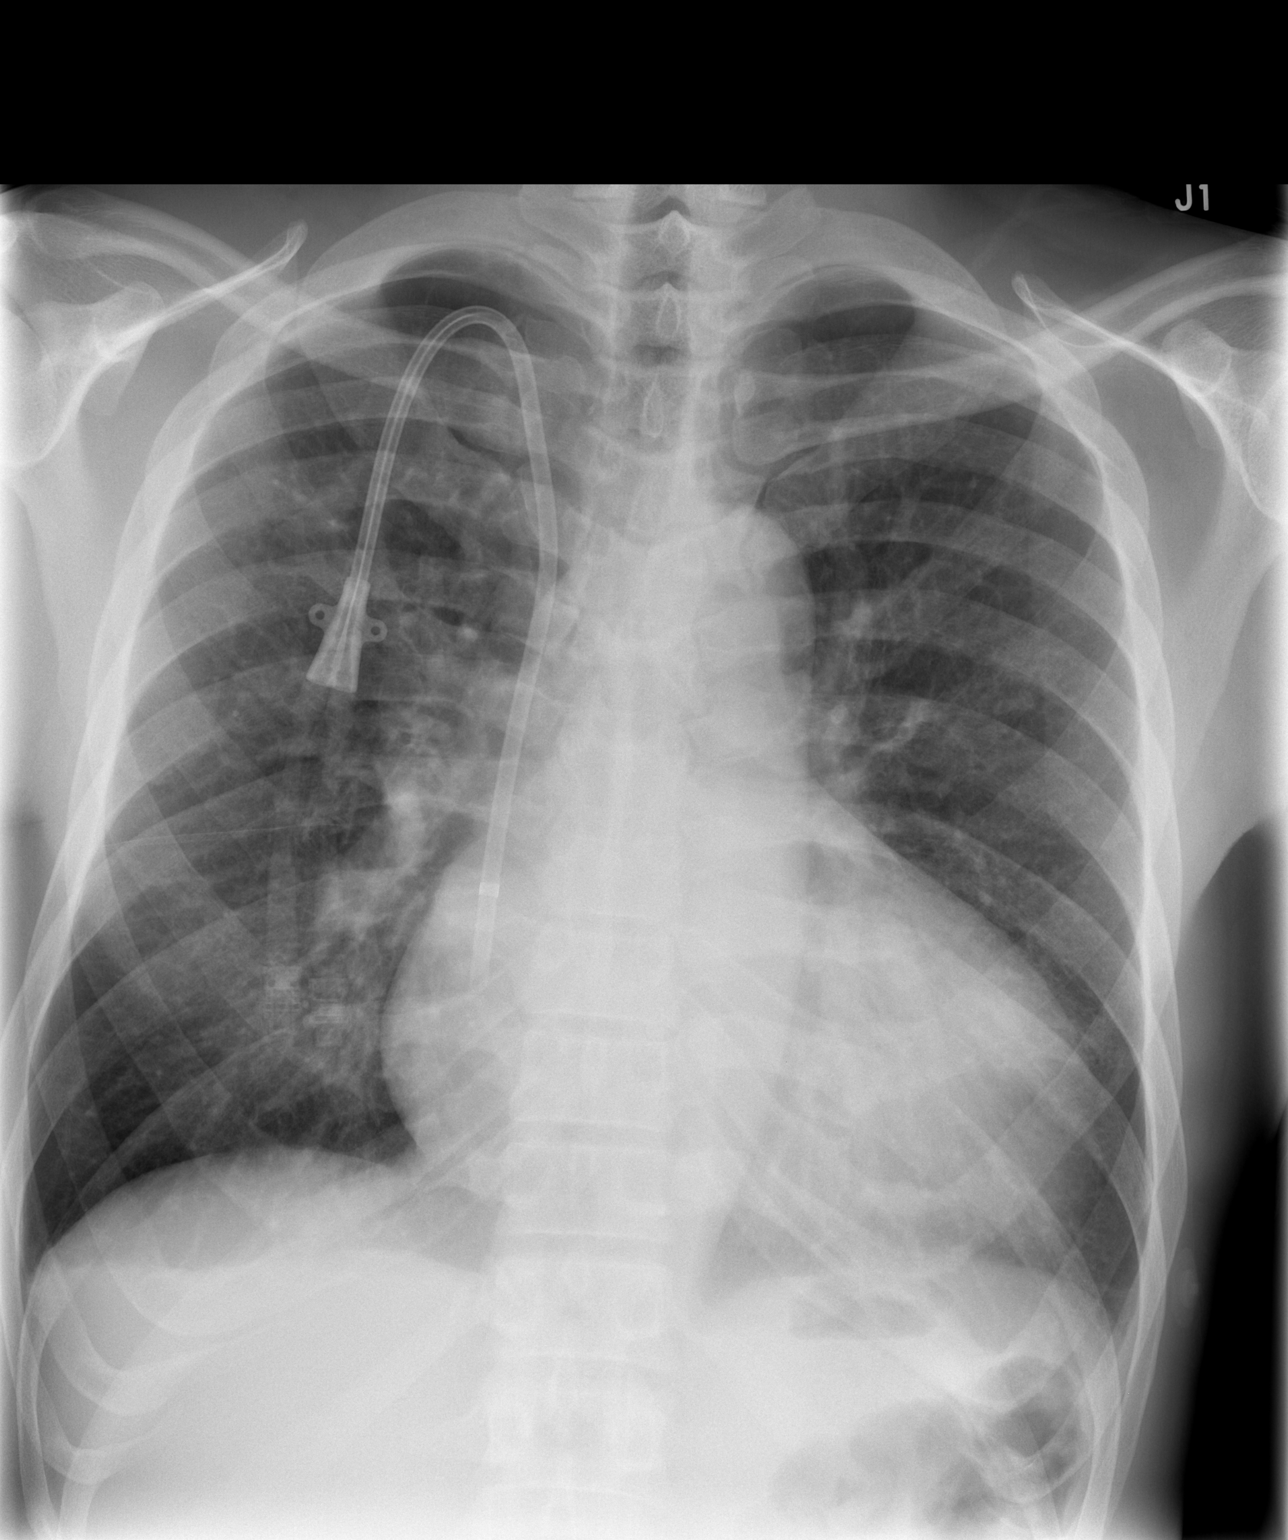

[w chest lat]
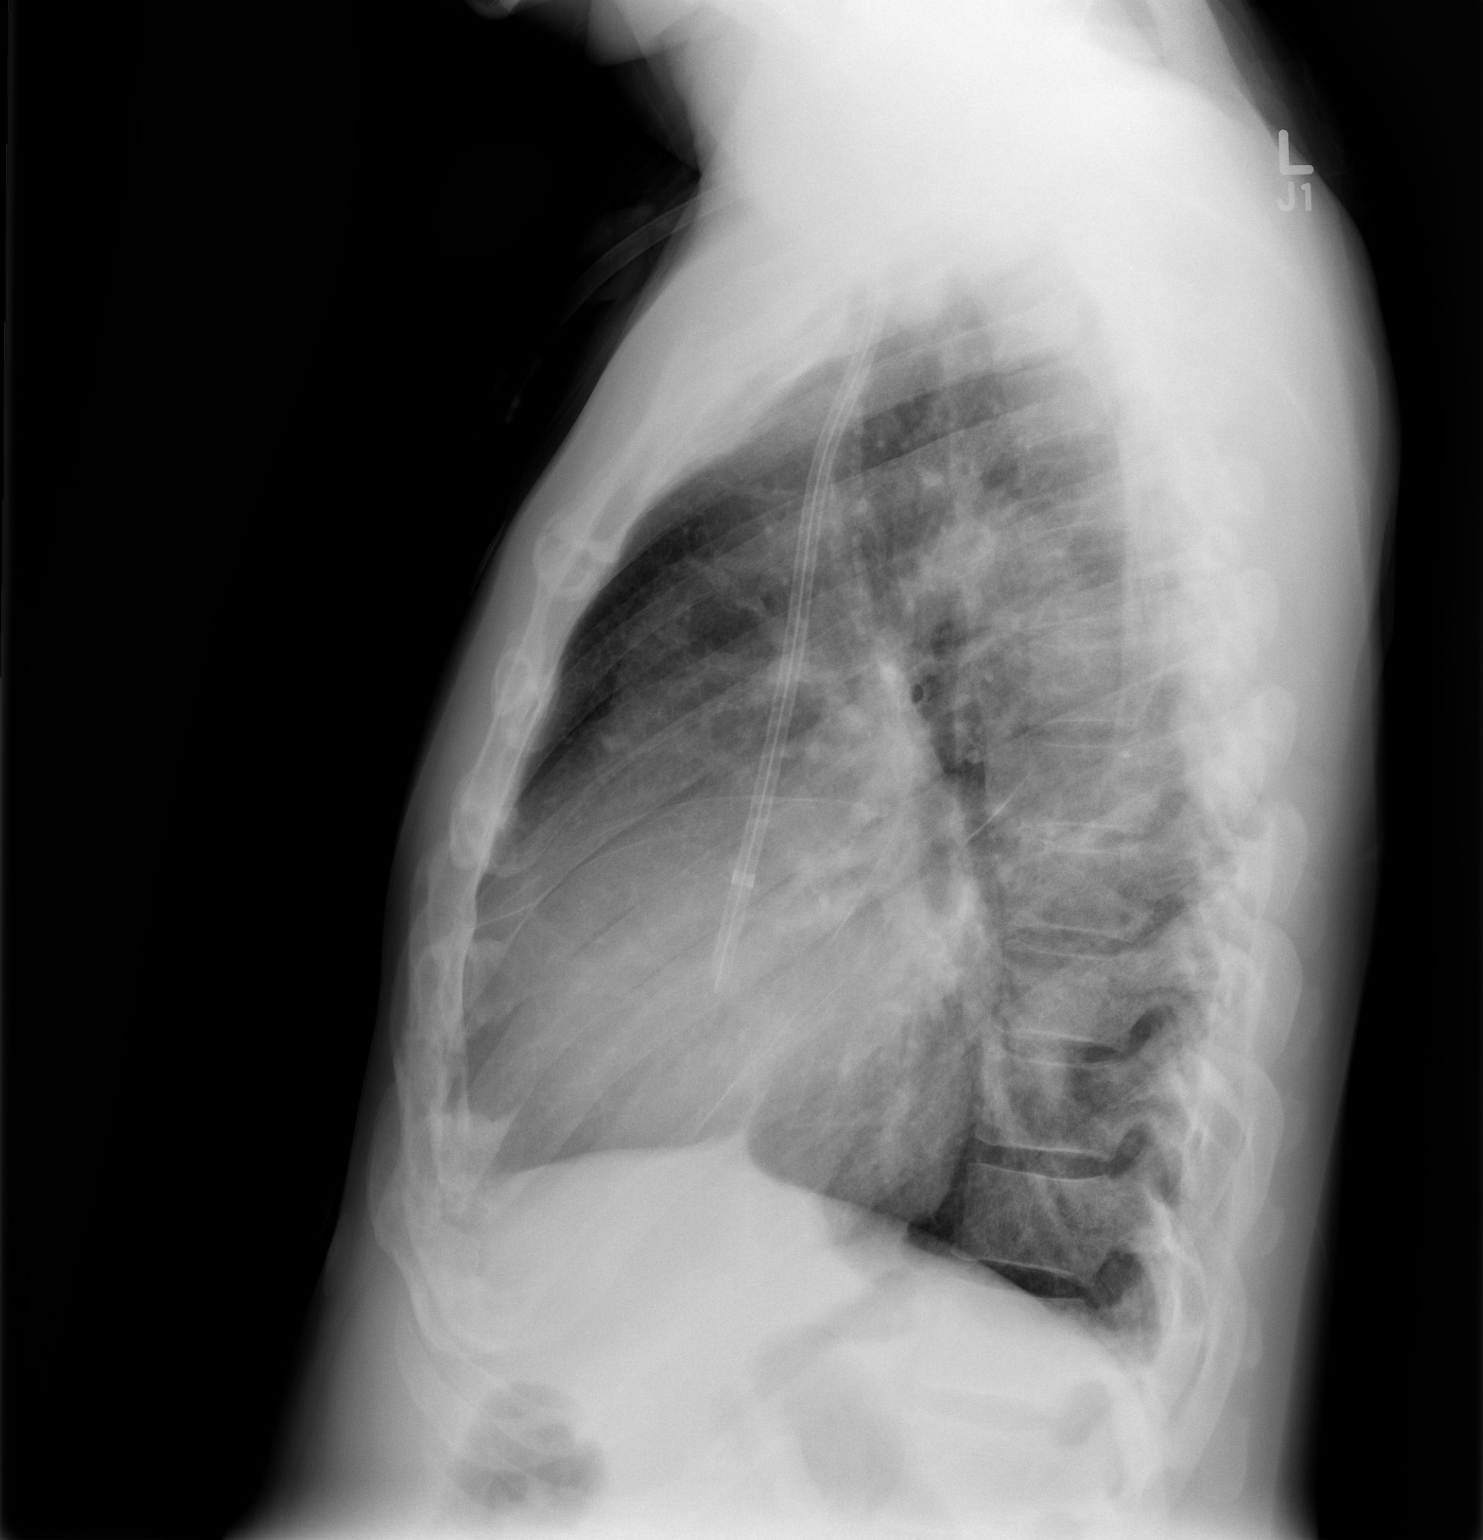

[2 of 2 positions shown; findings below may reference images not displayed]

FINDINGS: RIGHT jugular central venous catheter with tip projecting over
cavoatrial junction versus high RIGHT atrium, unchanged.

Enlargement of cardiac silhouette with pulmonary vascular
congestion.

Mediastinal contours normal.

No acute failure or consolidation.

No pleural effusion or pneumothorax.

Bones unremarkable.
IMPRESSION: Enlargement of cardiac silhouette with pulmonary vascular
congestion.

No acute infiltrate.

## 2016-06-03 ENCOUNTER — Non-Acute Institutional Stay (HOSPITAL_COMMUNITY)
Admission: EM | Admit: 2016-06-03 | Discharge: 2016-06-04 | Disposition: A | Discharge status: Home / Self Care | Payer: Medicare Other | Attending: Emergency Medicine | Admitting: Emergency Medicine

## 2016-06-03 ENCOUNTER — Encounter (HOSPITAL_COMMUNITY): Payer: Self-pay | Admitting: Emergency Medicine

## 2016-06-03 DIAGNOSIS — Z992 Dependence on renal dialysis: Secondary | ICD-10-CM | POA: Insufficient documentation

## 2016-06-03 DIAGNOSIS — R112 Nausea with vomiting, unspecified: Secondary | ICD-10-CM | POA: Diagnosis not present

## 2016-06-03 DIAGNOSIS — I132 Hypertensive heart and chronic kidney disease with heart failure and with stage 5 chronic kidney disease, or end stage renal disease: Secondary | ICD-10-CM | POA: Diagnosis not present

## 2016-06-03 DIAGNOSIS — Z87891 Personal history of nicotine dependence: Secondary | ICD-10-CM | POA: Insufficient documentation

## 2016-06-03 DIAGNOSIS — Z7901 Long term (current) use of anticoagulants: Secondary | ICD-10-CM | POA: Diagnosis not present

## 2016-06-03 DIAGNOSIS — I5042 Chronic combined systolic (congestive) and diastolic (congestive) heart failure: Secondary | ICD-10-CM | POA: Diagnosis not present

## 2016-06-03 DIAGNOSIS — Z86718 Personal history of other venous thrombosis and embolism: Secondary | ICD-10-CM | POA: Insufficient documentation

## 2016-06-03 DIAGNOSIS — I428 Other cardiomyopathies: Secondary | ICD-10-CM | POA: Diagnosis not present

## 2016-06-03 DIAGNOSIS — Z79899 Other long term (current) drug therapy: Secondary | ICD-10-CM | POA: Insufficient documentation

## 2016-06-03 DIAGNOSIS — E785 Hyperlipidemia, unspecified: Secondary | ICD-10-CM | POA: Diagnosis not present

## 2016-06-03 DIAGNOSIS — Z7952 Long term (current) use of systemic steroids: Secondary | ICD-10-CM | POA: Diagnosis not present

## 2016-06-03 DIAGNOSIS — R1110 Vomiting, unspecified: Secondary | ICD-10-CM | POA: Diagnosis present

## 2016-06-03 DIAGNOSIS — K219 Gastro-esophageal reflux disease without esophagitis: Secondary | ICD-10-CM | POA: Insufficient documentation

## 2016-06-03 DIAGNOSIS — N186 End stage renal disease: Secondary | ICD-10-CM | POA: Diagnosis not present

## 2016-06-03 DIAGNOSIS — E1122 Type 2 diabetes mellitus with diabetic chronic kidney disease: Secondary | ICD-10-CM | POA: Insufficient documentation

## 2016-06-03 DIAGNOSIS — E875 Hyperkalemia: Secondary | ICD-10-CM | POA: Insufficient documentation

## 2016-06-03 NOTE — ED Triage Notes (Addendum)
Pt. reports multiple emesis after hemodialysis today , denies diarrhea , no fever or chills , pt. added low back pain / denies injury . Pt. refused blood specimen collection at triage .

## 2016-06-03 NOTE — Discharge Summary (Signed)
[facility] Discharge Summary   Patient: Frank [name] JME:[id number]   PCP: Angelica Chessman, MD DOB: 04-26-1966   Date of admission: 05/30/2016   Date of discharge: 05/31/2016    Discharge Diagnoses:  Principal Problem:   Hyperkalemia Active Problems:   Anemia of chronic kidney failure   ESRD on hemodialysis ([place])   Essential hypertension   Nonischemic cardiomyopathy ([employer])   Bradycardia  Admitted From: home Disposition:  home  Recommendations for Outpatient Follow-up:  1. Please follow up with PCP in 1 week   Follow-up Information    JEGEDE, OLUGBEMIGA, MD. Schedule an appointment as soon as possible for a visit in 1 week(s).   Specialty:  Internal Medicine Contact information: [address] [place] [address] [phone]          Diet recommendation: renal diet  Activity: The patient is advised to gradually reintroduce usual activities.  Discharge Condition: good  Code Status: full code  History of present illness: As per the H and P dictated on admission, "Frank [name] is a 50 y.o. male with a past medical history significant forESRD on HD without HD center, failed renal transplant, chronic pain, recurrent DVT on warfarin, isch CM/CHF EF 35-40%, history of RCC scheduled for nephrectomy, and HTN who presents with weakness.  She was admitted 2 days ago for hyperkalemia, underwent hemodialysis yesterday afternoon. He was kept overnight last night (so that he can have dialysis again today he states), but today was told he did not need dialysis and to come back to the emergency room tomorrow, and so this afternoon around 3 PM he was discharged from the hospital.  He claims that he was able to walk as far as his car, but by the time he got his car he was feeling suddenly weak, almost unable to walk, nauseated, "like I was about to fall out", with severe tingling in his left hand and both feet. He was able to get into his car, and drove up to the Valet area  where he stopped and had them take him down to the emergency room."  Hospital Course:  Summary of his active problems in the hospital is as following. 1. Hyperkalemia:With ECG changes. -Nephrology consulted HD. Pt stable after HD to be discharged.  2. ESRD: -Continue Renvela and cinacalcet  3. HTN: -Continue Imdur, doxazosin, hydralazine, and amlodipine, and other medication   4. CHF: EF 35-40%. Euvolemic  5. Chronic pain: Please see previous notes regarding outpatient opioids. Patient may have secondary gain from being in hospital. Pt requesting 5 days of oxycodone, while he already has received new script on yesterday's discharge. No scripts given.  6. Anemia of renal disease: Stable  7. History of DVT: -Continue warfarin, dosed per pharmacy  8. Failed renal transplant: -Continue prednisone 10 mg daily  9. Other medications: -Continue PPI and H2RA -Continue allopurinol -Continue gabapentin  All other chronic medical condition were stable during the hospitalization.  Patient was ambulatory without any assistance. On the day of the discharge the patient's vitals stable, and no other acute medical condition were reported by patient. the patient was felt safe to be discharge at home with family.  Procedures and Results:  HD   Consultations:  neohrology  DISCHARGE MEDICATION: Discharge Medication List as of 05/31/2016  1:30 PM    CONTINUE these medications which have NOT CHANGED   Details  allopurinol (ZYLOPRIM) 100 MG tablet Take 100 mg by mouth daily., Historical Med    amLODipine (NORVASC) 10 MG  tablet Take 10 mg by mouth daily., Historical Med    atorvastatin (LIPITOR) 40 MG tablet Take 1 tablet (40 mg total) by mouth daily at 6 PM., Starting Fri 09/02/2014, Print    carvedilol (COREG) 25 MG tablet Take 25 mg by mouth 2 (two) times daily with a meal., Historical Med    cinacalcet (SENSIPAR) 30 MG tablet Take 30 mg by mouth daily.,  Historical Med    cloNIDine (CATAPRES - DOSED IN MG/24 HR) 0.3 mg/24hr patch Place 0.3 mg onto the skin once a week., Historical Med    doxazosin (CARDURA) 2 MG tablet Take 2 mg by mouth daily., Historical Med    famotidine (PEPCID) 20 MG tablet Take 1 tablet (20 mg total) by mouth 2 (two) times daily., Starting Tue 02/13/2016, Print    gabapentin (NEURONTIN) 100 MG capsule Take 1 capsule (100 mg total) by mouth at bedtime., Starting Tue 05/21/2016, No Print    hydrALAZINE (APRESOLINE) 100 MG tablet Take 100 mg by mouth 2 (two) times daily. , Historical Med    isosorbide mononitrate (IMDUR) 60 MG 24 hr tablet Take 1 tablet (60 mg total) by mouth daily., Starting Tue 01/09/2016, No Print    omeprazole (PRILOSEC) 20 MG capsule Take 20 mg by mouth daily., Starting Thu 07/01/2013, Historical Med    ondansetron (ZOFRAN ODT) 4 MG disintegrating tablet Take 1 tablet (4 mg total) by mouth every 8 (eight) hours as needed for nausea or vomiting., Starting Thu 05/16/2016, Print    oxycodone (OXY-IR) 5 MG capsule Take 1 capsule (5 mg total) by mouth every 4 (four) hours as needed for pain., Starting Thu 05/30/2016, Print    predniSONE (DELTASONE) 10 MG tablet Take 10 mg by mouth daily with breakfast., Historical Med    sevelamer carbonate (RENVELA) 800 MG tablet Take 800-1,600 mg by mouth 3 (three) times daily with meals. 2 tabs three times daily with meals, and 1 tablet with snacks, Historical Med    !! warfarin (COUMADIN) 2 MG tablet Take 2 mg by mouth daily. Take with 166m tablet to equal 733m Starting Wed 01/31/2016, Historical Med    !! warfarin (COUMADIN) 5 MG tablet Take 5 mg by mouth daily. Take with 66m21mablet to equal 7mg29mtarting Wed 01/31/2016, Historical Med     !! - Potential duplicate medications found. Please discuss with provider.     Allergies  Allergen Reactions  . Butalbital-Apap-Caffeine Shortness Of Breath and Swelling    Swelling in throat  . Ferrlecit [Na Ferric Gluc Cplx In  Sucrose] Shortness Of Breath, Swelling and Other (See Comments)    Swelling in throat  . Minoxidil Shortness Of Breath  . Darvocet [Propoxyphene N-Acetaminophen] Hives  . Other Hives    Allergy listed on file from [facility] did not mention any allergies) Pt able to do norco (tylenol)   Discharge Instructions    Diet renal 60/[id number]    Complete by:  As directed    Discharge instructions    Complete by:  As directed    It is important that you read following instructions as well as go over your medication list with RN to help you understand your care after this hospitalization.  Discharge Instructions: Please follow-up with PCP in one week Please remain complaint with medication and HD schedule  Please request your primary care physician to go over all Hospital Tests and Procedure/Radiological results at the follow up,  Please get all Hospital records sent to your PCP by signing hospital release  before you go home.   Do not take more than prescribed Pain, Sleep and Anxiety Medications. You were cared for by a hospitalist during your hospital stay. If you have any questions about your discharge medications or the care you received while you were in the hospital after you are discharged, you can call the unit and ask to speak with the hospitalist on call if the hospitalist that took care of you is not available.  Once you are discharged, your primary care physician will handle any further medical issues. Please note that NO REFILLS for any discharge medications will be authorized once you are discharged, as it is imperative that you return to your primary care physician (or establish a relationship with a primary care physician if you do not have one) for your aftercare needs so that they can reassess your need for medications and monitor your lab values. You Must read complete instructions/literature along with all the possible adverse reactions/side effects for all the Medicines  you take and that have been prescribed to you. Take any new Medicines after you have completely understood and accept all the possible adverse reactions/side effects. Wear Seat belts while driving. If you have smoked or chewed Tobacco in the last 2 yrs please stop smoking and/or stop any Recreational drug use.   Increase activity slowly    Complete by:  As directed      Discharge Exam: Filed Weights   05/31/16 0841 05/31/16 1242  Weight: 77.2 kg (170 lb 3.1 oz) 73.7 kg (162 lb 7.7 oz)   Vitals:   05/31/16 1230 05/31/16 1242  BP: (!) 172/80 (!) 179/92  Pulse: 73 69  Resp:  17  Temp:  98.2 F (36.8 C)   General: Appear in no distress, no Rash; Oral Mucosa moist. Cardiovascular: S1 and S2 Present, no Murmur, no JVD Respiratory: Bilateral Air entry present and Clear to Auscultation, no Crackles, no wheezes Abdomen: Bowel Sound present, Soft and no tenderness Extremities: no Pedal edema, no calf tenderness Neurology: Grossly no focal neuro deficit.  The results of significant diagnostics from this hospitalization (including imaging, microbiology, ancillary and laboratory) are listed below for reference.    Significant Diagnostic Studies: Dg Chest 2 View  Result Date: 05/18/2016 CLINICAL DATA:  Dyspnea with fatigue. Low back pain and dizziness after dialysis. EXAM: CHEST  2 VIEW COMPARISON:  05/14/2016 CXR and FINDINGS: Right dialysis catheter tip is seen in the distal SVC. Aortic atherosclerosis without aneurysm is again noted. The cardiac silhouette is top-normal in size allowing for portable technique. Lungs are free of pneumonic consolidations. No effusions or pulmonary edema. No pneumothorax. No suspicious osseous abnormalities. IMPRESSION: No acute cardiopulmonary disease. Electronically Signed   By: Ashley Royalty M.D.   On: 05/18/2016 00:23   Dg Chest Port 1 View  Result Date: 05/14/2016 CLINICAL DATA:  Acute onset of shortness of breath which began yesterday. Current history of  end-stage renal disease on hemodialysis. EXAM: PORTABLE CHEST 1 VIEW COMPARISON:  03/31/2016, 03/11/2016 and earlier. FINDINGS: Cardiac silhouette markedly enlarged, unchanged. Mild pulmonary venous hypertension without overt edema currently. No confluent airspace consolidation. No visible pleural effusions. Right jugular dialysis catheter tip projects over expected location of a cavoatrial junction, unchanged. IMPRESSION: Stable marked cardiomegaly. Pulmonary venous hypertension without overt edema currently. No acute cardiopulmonary disease. Electronically Signed   By: Evangeline Dakin M.D.   On: 05/14/2016 16:44   Mr Brain Ltd Wo Contrast  Result Date: 05/30/2016 CLINICAL DATA:  Initial evaluation for left-sided numbness. EXAM:  MRI HEAD WITHOUT CONTRAST TECHNIQUE: Multiplanar, multiecho pulse sequences of the brain and surrounding structures were obtained without intravenous contrast. COMPARISON:  Prior CT from 05/05/2014. FINDINGS: Brain: Cerebral volume within normal limits. Minimal T2/FLAIR hyperintensity within the periventricular white matter, most likely related chronic small vessel ischemic disease, felt to be within normal limits for age. No abnormal foci of restricted diffusion to suggest acute or subacute ischemia. Gray-white matter differentiation maintained. No evidence for acute or chronic intracranial hemorrhage. No encephalomalacia to suggest chronic infarction. No mass lesion, midline shift, or mass effect. No hydrocephalus. No extra-axial fluid collection. Major dural sinuses are grossly patent. Pituitary gland and suprasellar region within normal limits. Vascular: Major intracranial vascular flow voids are maintained. Skull and upper cervical spine: Craniocervical junction normal. Visualized upper cervical spine unremarkable without significant degenerative changes are stenosis. Bone marrow signal intensity within normal limits. No scalp soft tissue abnormality. Sinuses/Orbits: Globes and  orbital soft tissues within normal limits. Moderate mucosal thickening within the maxillary sinuses and ethmoidal air cells. Paranasal sinuses are otherwise clear. No mastoid effusion. Inner ear structures grossly normal. IMPRESSION: 1. Normal brain MRI. No acute intracranial infarct or other process identified. 2. Moderate maxillary and ethmoidal sinus disease, likely allergic/inflammatory in nature. Electronically Signed   By: Jeannine Boga M.D.   On: 05/30/2016 22:03    Microbiology: No results found for this or any previous visit (from the past 240 hour(s)).   Labs: CBC:  Recent Labs Lab 05/27/16 0711  05/28/16 2310 05/28/16 2319 05/30/16 1945 05/30/16 2036 05/31/16 0900  WBC 6.1  --  5.4  --  6.4  --  5.8  NEUTROABS  --   --  3.5  --  5.2  --   --   HGB 7.6*  < > 7.8* 9.2* 8.2* 9.2* 7.8*  HCT 23.2*  < > 25.2* 27.0* 26.3* 27.0* 24.9*  MCV 87.2  --  90.0  --  90.1  --  89.2  PLT 200  --  187  --  148*  --  147*  < > = values in this interval not displayed. Basic Metabolic Panel:  Recent Labs Lab 05/27/16 0712  05/28/16 2310  05/29/16 1350 05/30/16 0509 05/30/16 1945 05/30/16 2036 05/31/16 0139 05/31/16 0902  NA 138  < > 140  < > 138 138 134* 134*  --  135  K 6.3*  < > 6.2*  < > 6.9* 4.6 6.3* 6.4* 5.3* 4.8  CL 95*  < > 102  < > 100* 99* 98* 98*  --  96*  CO2 27  --  25  --  _0 --   --  28  GLUCOSE 73  < > 119*  < > 129* 83 106* 97  --  113*  BUN 55*  < > 37*  < > 44* 20 28* 34*  --  33*  CREATININE 15.99*  < > 11.78*  < > 12.59* 7.71* 9.34* 10.20*  --  11.05*  CALCIUM 8.4*  --  7.9*  --  8.1* 8.1* 8.0*  --   --  8.1*  PHOS 4.2  --   --   --  4.1  --   --   --   --   --   < > = values in this interval not displayed. Liver Function Tests:  Recent Labs Lab 05/27/16 0712 05/28/16 2310 05/29/16 1350 05/30/16 1945  AST  --  30  --  30  ALT  --  14*  --  13*  ALKPHOS  --  126  --  111  BILITOT  --  0.4  --  0.5  PROT  --  6.9  --  7.1  ALBUMIN 2.7*  3.1* 3.0* 3.1*   No results for input(s): LIPASE, AMYLASE in the last 168 hours. No results for input(s): AMMONIA in the last 168 hours. Cardiac Enzymes:  Recent Labs Lab 05/31/16 0139 05/31/16 0902  CKTOTAL 104  --   CKMB 2.9  --   TROPONINI <0.03 0.03*   BNP (last 3 results)  Recent Labs  07/17/15 2135 09/24/15 0324  BNP >4,500.0* >4,500.0*   CBG: No results for input(s): GLUCAP in the last 168 hours. Time spent: 30 minutes  Signed:  Berle Mull  [facility] 05/31/2016 , 6:32 AM

## 2016-06-03 NOTE — ED Provider Notes (Signed)
[facility] DEPT Provider Note   CSN: [id number] Arrival date & time: 06/03/16  2120  By signing my name below, I, Arianna Nassar, attest that this documentation has been prepared under the direction and in the presence of Orpah Greek, MD.  Electronically Signed: Julien Nordmann, ED Scribe. 06/03/16. 12:03 AM.    History   Chief Complaint Chief Complaint  Patient presents with  . Emesis    The history is provided by the patient. No language interpreter was used.   HPI Comments: Frank [name] is a 50 y.o. male who who has a PMhx of hypokalemia, ERSD, HTN, and DMII presents to the Emergency Department complaining of moderate, gradual worsening, vomiting that's started today. He reports associated mid back pain, generalized myalgias and nausea. Pt believes that his potassium is elevated. He is a hemodialysis pt and reports his last treatment was 11/09. He was seen and admitted for hypokalemia in which he received hemodialysis. There are no other complaints.   Past Medical History:  Diagnosis Date  . Anemia   . Anxiety   . Chronic combined systolic and diastolic CHF (congestive heart failure) (HCC)    a. EF 20-25% by echo in 08/2015 b. echo 10/2015: EF 35-40%, diffuse HK, severe LAE, moderate RAE, small pericardial effusion  . Complication of anesthesia    itching, sore throat  . Depression   . Dialysis patient ([place])   . ESRD (end stage renal disease) ([place])    due to HTN per patient, followed at [facility], s/p failed kidney transplant - dialysis Tue, Th, Sat  . Hyperkalemia 12/2015  . Hypertension   . Junctional rhythm    a. noted in 08/2015: hyperkalemic at that time  b. 12/2015: presented in junctional rhythm w/ K+ of 6.6. Resolved with improvement of K+ levels.  . Nonischemic cardiomyopathy ([place])    a. 08/2014: cath showing minimal CAD, but tortuous arteries noted.   . Personal history of DVT (deep vein thrombosis)/ PE 05/26/2016   In Oct 2015 had small subsemental LUL  PE w/o DVT (LE dopplers neg) and was felt to be HD cath related, treated w coumadin.  IN May 2016 had small vein DVT (acute/subacute) in the R basilic/ brachial veins of the RUE, resumed on coumadin.  Had R sided HD cath at that time.    . Renal insufficiency   . Shortness of breath   . Type II diabetes mellitus (HCC)    No history per patient, but remains under history as A1c would not be accurate given on dialysis    Patient Active Problem List   Diagnosis Date Noted  . Hyperkalemia 05/29/2016  . Chronic back pain 05/29/2016  . GERD (gastroesophageal reflux disease) 05/29/2016  . Personal history of DVT (deep vein thrombosis)/ PE 05/26/2016  . Bradycardia 03/31/2016  . Nonischemic cardiomyopathy ([place]) 01/09/2016  . Bilateral low back pain without sciatica   . Left renal mass 10/30/2015  . Constipation 10/30/2015  . Adjustment disorder with mixed anxiety and depressed mood 08/20/2015  . Chronic epididymitis 06/19/2015  . Groin pain, chronic, right 06/19/2015  . Incarcerated right inguinal hernia 02/16/2015  . Essential hypertension 01/02/2015  . Dyslipidemia   . ESRD on hemodialysis ([place])   . Anemia of chronic kidney failure 06/24/2013    Past Surgical History:  Procedure Laterality Date  . CAPD INSERTION    . CAPD REMOVAL    . INGUINAL HERNIA REPAIR Right 02/14/2015   Procedure: REPAIR INCARCERATED RIGHT INGUINAL HERNIA;  Surgeon: [name],  MD;  Location: [place];  Service: General;  Laterality: Right;  . INSERTION OF DIALYSIS CATHETER Right 09/23/2015   Procedure: exchange of Right internal Dialysis Catheter.;  Surgeon: [name], MD;  Location: [place];  Service: Vascular;  Laterality: Right;  . KIDNEY RECEIPIENT  2006   failed and started HD in March 2014  . LEFT HEART CATHETERIZATION WITH CORONARY ANGIOGRAM N/A 09/02/2014   Procedure: LEFT HEART CATHETERIZATION WITH CORONARY ANGIOGRAM;  Surgeon: [name], MD;  Location: [facility] CATH LAB;  Service: Cardiovascular;   Laterality: N/A;       Home Medications    Prior to Admission medications   Medication Sig Start Date End Date Taking? Authorizing Provider  allopurinol (ZYLOPRIM) 100 MG tablet Take 100 mg by mouth daily.    Historical Provider, MD  amLODipine (NORVASC) 10 MG tablet Take 10 mg by mouth daily.    Historical Provider, MD  atorvastatin (LIPITOR) 40 MG tablet Take 1 tablet (40 mg total) by mouth daily at 6 PM. 09/02/14   Barton Dubois, MD  carvedilol (COREG) 25 MG tablet Take 25 mg by mouth 2 (two) times daily with a meal.    Historical Provider, MD  cinacalcet (SENSIPAR) 30 MG tablet Take 30 mg by mouth daily.    Historical Provider, MD  cloNIDine (CATAPRES - DOSED IN MG/24 HR) 0.3 mg/24hr patch Place 0.3 mg onto the skin once a week.    Historical Provider, MD  doxazosin (CARDURA) 2 MG tablet Take 2 mg by mouth daily.    Historical Provider, MD  famotidine (PEPCID) 20 MG tablet Take 1 tablet (20 mg total) by mouth 2 (two) times daily. 02/13/16   Charlesetta Shanks, MD  gabapentin (NEURONTIN) 100 MG capsule Take 1 capsule (100 mg total) by mouth at bedtime. 05/21/16   [name], DO  hydrALAZINE (APRESOLINE) 100 MG tablet Take 100 mg by mouth 2 (two) times daily.     Historical Provider, MD  isosorbide mononitrate (IMDUR) 60 MG 24 hr tablet Take 1 tablet (60 mg total) by mouth daily. 01/09/16   Smiley Houseman, MD  omeprazole (PRILOSEC) 20 MG capsule Take 20 mg by mouth daily. 07/01/13   Historical Provider, MD  ondansetron (ZOFRAN ODT) 4 MG disintegrating tablet Take 1 tablet (4 mg total) by mouth every 8 (eight) hours as needed for nausea or vomiting. 05/16/16   Sherwood Gambler, MD  oxycodone (OXY-IR) 5 MG capsule Take 1 capsule (5 mg total) by mouth every 4 (four) hours as needed for pain. 05/30/16   [name], MD  predniSONE (DELTASONE) 10 MG tablet Take 10 mg by mouth daily with breakfast.    Historical Provider, MD  sevelamer carbonate (RENVELA) 800 MG tablet Take 800-1,600 mg by  mouth 3 (three) times daily with meals. 2 tabs three times daily with meals, and 1 tablet with snacks    Historical Provider, MD  warfarin (COUMADIN) 2 MG tablet Take 2 mg by mouth daily. Take with 75m tablet to equal 733m7/12/17   Historical Provider, MD  warfarin (COUMADIN) 5 MG tablet Take 5 mg by mouth daily. Take with 74m34mablet to equal 7mg8m12/17   Historical Provider, MD    Family History Family History  Problem Relation Age of Onset  . Hypertension Other     Social History Social History  Substance Use Topics  . Smoking status: Former Smoker    Packs/day: 0.00    Years: 1.00    Types: Cigarettes  .  Smokeless tobacco: Never Used     Comment: quit Jan 2014  . Alcohol use No     Allergies   Butalbital-apap-caffeine; Ferrlecit [na ferric gluc cplx in sucrose]; Minoxidil; Darvocet [propoxyphene n-acetaminophen]; and Other   Review of Systems Review of Systems  Gastrointestinal: Positive for nausea.  Musculoskeletal: Positive for back pain.  All other systems reviewed and are negative.    Physical Exam Updated Vital Signs BP 191/96   Pulse 73   Temp 98.1 F (36.7 C) (Oral)   Resp 20   Ht _0  (1.88 m)   Wt 162 lb (73.5 kg)   SpO2 100%   BMI 20.80 kg/m   Physical Exam  Constitutional: He is oriented to person, place, and time. He appears well-developed and well-nourished. No distress.  HENT:  Head: Normocephalic and atraumatic.  Right Ear: Hearing normal.  Left Ear: Hearing normal.  Nose: Nose normal.  Mouth/Throat: Oropharynx is clear and moist and mucous membranes are normal.  Eyes: Conjunctivae and EOM are normal. Pupils are equal, round, and reactive to light.  Neck: Normal range of motion. Neck supple.  Cardiovascular: Regular rhythm, S1 normal and S2 normal.  Exam reveals no gallop and no friction rub.   No murmur heard. Pulmonary/Chest: Effort normal and breath sounds normal. No respiratory distress. He exhibits no tenderness.  Abdominal: Soft.  Normal appearance and bowel sounds are normal. There is no hepatosplenomegaly. There is no tenderness. There is no rebound, no guarding, no tenderness at McBurney's point and negative Murphy's sign. No hernia.  Musculoskeletal: Normal range of motion.  Neurological: He is alert and oriented to person, place, and time. He has normal strength. No cranial nerve deficit or sensory deficit. Coordination normal. GCS eye subscore is 4. GCS verbal subscore is 5. GCS motor subscore is 6.  Skin: Skin is warm, dry and intact. No rash noted. No cyanosis.  Psychiatric: He has a normal mood and affect. His speech is normal and behavior is normal. Thought content normal.  Nursing note and vitals reviewed.    ED Treatments / Results  DIAGNOSTIC STUDIES: Oxygen Saturation is 100% on RA, normal by my interpretation.  COORDINATION OF CARE:  12:02 AM Discussed treatment plan with pt at bedside and pt agreed to plan.  Labs (all labs ordered are listed, but only abnormal results are displayed) Labs Reviewed  COMPREHENSIVE METABOLIC PANEL - Abnormal; Notable for the following:       Result Value   Potassium 6.2 (*)    BUN 66 (*)    Creatinine, Ser 14.13 (*)    Calcium 8.3 (*)    Albumin 3.1 (*)    Alkaline Phosphatase 161 (*)    GFR calc non Af Amer 3 (*)    GFR calc Af Amer 4 (*)    All other components within normal limits  CBC - Abnormal; Notable for the following:    RBC 2.81 (*)    Hemoglobin 8.0 (*)    HCT 25.5 (*)    RDW 17.3 (*)    All other components within normal limits  URINALYSIS, ROUTINE W REFLEX MICROSCOPIC (NOT AT [facility])    EKG  EKG Interpretation  Date/Time:  Tuesday June 04 2016 02:01:57 EST Ventricular Rate:  73 PR Interval:    QRS Duration: 105 QT Interval:  435 QTC Calculation: 480 R Axis:   -59 Text Interpretation:  Sinus or ectopic atrial rhythm Left anterior fascicular block Probable left ventricular hypertrophy Abnormal T, consider ischemia, lateral leads -  unchanged from previous Baseline wander in lead(s) V2 T-wave peaking Confirmed by [facility]  MD, Ahmari Duerson [phone]) on 06/04/2016 2:10:51 AM       Radiology No results found.  Procedures Procedures (including critical care time)  Medications Ordered in ED Medications  oxyCODONE-acetaminophen (PERCOCET/ROXICET) 5-325 MG per tablet 1 tablet (1 tablet Oral Given 06/04/16 0041)  promethazine (PHENERGAN) injection 25 mg (25 mg Intramuscular Given 06/04/16 0041)     Initial Impression / Assessment and Plan / ED Course  I have reviewed the triage vital signs and the nursing notes.  Pertinent labs & imaging results that were available during my care of the patient were reviewed by me and considered in my medical decision making (see chart for details).  Clinical Course    Patient presents to the emergency department for evaluation of abdominal pain. Patient presents with typical abdominal pain and muscle cramping that he has been seen multiple times previously for. Workup is unremarkable. Patient is end-stage renal disease and requires dialysis. He does not currently have a dialysis center, presents for dialysis through the ER here. He has not had dialysis in 4 days. Patient attributes his symptoms to those that he gets when his potassium is elevated. Potassium was found to be 6.2. EKG does show T wave peaking, no arrhythmia.  Discussed with Dr. Florene Glen, nephrology. Patient does not warrant emergent dialysis tonight, will have dialysis first thing in the morning. Recommends Kayexalate.  I personally performed the services described in this documentation, which was scribed in my presence. The recorded information has been reviewed and is accurate.   Final Clinical Impressions(s) / ED Diagnoses   Final diagnoses:  Non-intractable vomiting with nausea, unspecified vomiting type  Hyperkalemia    New Prescriptions New Prescriptions   No medications on file     Orpah Greek,  MD 06/04/16 [phone]

## 2016-06-04 LAB — CBC
HCT: 25.5 % — ABNORMAL LOW (ref 39.0–52.0)
HEMOGLOBIN: 8 g/dL — AB (ref 13.0–17.0)
MCH: 28.5 pg (ref 26.0–34.0)
MCHC: 31.4 g/dL (ref 30.0–36.0)
MCV: 90.7 fL (ref 78.0–100.0)
PLATELETS: 196 10*3/uL (ref 150–400)
RBC: 2.81 MIL/uL — AB (ref 4.22–5.81)
RDW: 17.3 % — ABNORMAL HIGH (ref 11.5–15.5)
WBC: 6.1 10*3/uL (ref 4.0–10.5)

## 2016-06-04 LAB — COMPREHENSIVE METABOLIC PANEL
ALK PHOS: 161 U/L — AB (ref 38–126)
ALT: 18 U/L (ref 17–63)
ANION GAP: 14 (ref 5–15)
AST: 39 U/L (ref 15–41)
Albumin: 3.1 g/dL — ABNORMAL LOW (ref 3.5–5.0)
BILIRUBIN TOTAL: 0.4 mg/dL (ref 0.3–1.2)
BUN: 66 mg/dL — ABNORMAL HIGH (ref 6–20)
CALCIUM: 8.3 mg/dL — AB (ref 8.9–10.3)
CO2: 22 mmol/L (ref 22–32)
CREATININE: 14.13 mg/dL — AB (ref 0.61–1.24)
Chloride: 106 mmol/L (ref 101–111)
GFR calc non Af Amer: 3 mL/min — ABNORMAL LOW (ref >60)
GFR, EST AFRICAN AMERICAN: 4 mL/min — AB (ref >60)
GLUCOSE: 86 mg/dL (ref 65–99)
Potassium: 6.2 mmol/L — ABNORMAL HIGH (ref 3.5–5.1)
Sodium: 142 mmol/L (ref 135–145)
TOTAL PROTEIN: 6.7 g/dL (ref 6.5–8.1)

## 2016-06-04 MED ORDER — PROMETHAZINE HCL 25 MG/ML IJ SOLN
25.0000 mg | Freq: Once | INTRAMUSCULAR | Status: AC
  Administered 2016-06-04: 25 mg via INTRAMUSCULAR

## 2016-06-04 MED ORDER — ONDANSETRON 4 MG PO TBDP
4.0000 mg | ORAL_TABLET | Freq: Once | ORAL | Status: AC
  Administered 2016-06-04: 4 mg via ORAL
  Filled 2016-06-04: qty 1

## 2016-06-04 MED ORDER — PENTAFLUOROPROP-TETRAFLUOROETH EX AERO: 1.0000 "application " | INHALATION_SPRAY | CUTANEOUS | Status: DC | PRN

## 2016-06-04 MED ORDER — OXYCODONE-ACETAMINOPHEN 5-325 MG PO TABS
1.0000 | ORAL_TABLET | Freq: Once | ORAL | Status: AC
  Administered 2016-06-04: 1 via ORAL
  Filled 2016-06-04: qty 1

## 2016-06-04 MED ORDER — HEPARIN SODIUM (PORCINE) 1000 UNIT/ML DIALYSIS: 1000.0000 [IU] | INTRAMUSCULAR | Status: DC | PRN

## 2016-06-04 MED ORDER — DARBEPOETIN ALFA 200 MCG/0.4ML IJ SOSY
200.0000 ug | PREFILLED_SYRINGE | Freq: Once | INTRAMUSCULAR | Status: AC
  Administered 2016-06-04: 200 ug via INTRAVENOUS

## 2016-06-04 MED ORDER — LIDOCAINE HCL (PF) 1 % IJ SOLN: 5.0000 mL | INTRAMUSCULAR | Status: DC | PRN

## 2016-06-04 MED ORDER — SODIUM CHLORIDE 0.9 % IV SOLN: 100.0000 mL | INTRAVENOUS | Status: DC | PRN

## 2016-06-04 MED ORDER — DARBEPOETIN ALFA 200 MCG/0.4ML IJ SOSY
PREFILLED_SYRINGE | INTRAMUSCULAR | Status: AC
  Administered 2016-06-04: 200 ug via INTRAVENOUS
  Filled 2016-06-04: qty 0.4

## 2016-06-04 MED ORDER — ALTEPLASE 2 MG IJ SOLR: 2.0000 mg | Freq: Once | INTRAMUSCULAR | Status: DC | PRN

## 2016-06-04 MED ORDER — LIDOCAINE-PRILOCAINE 2.5-2.5 % EX CREA: 1.0000 "application " | TOPICAL_CREAM | CUTANEOUS | Status: DC | PRN

## 2016-06-04 MED ORDER — PROMETHAZINE HCL 25 MG/ML IJ SOLN: 25.0000 mg | Freq: Once | INTRAMUSCULAR | Status: DC

## 2016-06-04 MED ORDER — SODIUM POLYSTYRENE SULFONATE 15 GM/60ML PO SUSP
30.0000 g | Freq: Once | ORAL | Status: AC
Start: 2016-06-04 — End: 2016-06-04
  Administered 2016-06-04: 30 g via ORAL
  Filled 2016-06-04: qty 120

## 2016-06-04 NOTE — Procedures (Signed)
Patient seen and examined on Hemodialysis. QB 400 R TDC UF goal 3L . Getting aranesp. Treatment adjusted as needed.  Madelon Lips MD [facility]. Cell [phone] [phone] 2:04 PM

## 2016-06-04 NOTE — ED Notes (Signed)
Patients heart rate is 40 and patient is actively vomiting, edp notified

## 2016-06-04 NOTE — ED Notes (Signed)
Pt continues to disconnect him self from vital sign machine. Refuses to be connected all the time.

## 2016-06-04 NOTE — ED Notes (Signed)
Pt had brief episode of bradycardia in the 40's. No chest pain. Obtained EKG, notified MD. Pt became nauseous. Pt HR 60's now sinus rhythm

## 2016-06-04 NOTE — Progress Notes (Signed)
tx completed without  Complications; pt finished his 4 hr hemodialysis tx; denies pain; dizziness, n/v or SOB. Pt ambulated off the unit to go home.

## 2016-06-04 NOTE — ED Notes (Signed)
Gave report to dialysis

## 2016-06-04 NOTE — ED Notes (Signed)
Ordered reg diet tray

## 2016-06-07 ENCOUNTER — Emergency Department (HOSPITAL_COMMUNITY)
Admission: EM | Admit: 2016-06-07 | Discharge: 2016-06-08 | Disposition: A | Discharge status: Home / Self Care | Payer: Medicare Other | Attending: Emergency Medicine | Admitting: Emergency Medicine

## 2016-06-07 ENCOUNTER — Encounter (HOSPITAL_COMMUNITY): Payer: Self-pay | Admitting: Emergency Medicine

## 2016-06-07 ENCOUNTER — Emergency Department (HOSPITAL_COMMUNITY): Payer: Medicare Other

## 2016-06-07 ENCOUNTER — Encounter (HOSPITAL_COMMUNITY): Payer: Self-pay

## 2016-06-07 ENCOUNTER — Non-Acute Institutional Stay (HOSPITAL_COMMUNITY)
Admission: EM | Admit: 2016-06-07 | Discharge: 2016-06-07 | Disposition: A | Discharge status: Home / Self Care | Payer: Medicare Other | Source: Home / Self Care | Attending: Emergency Medicine | Admitting: Emergency Medicine

## 2016-06-07 DIAGNOSIS — E1122 Type 2 diabetes mellitus with diabetic chronic kidney disease: Secondary | ICD-10-CM | POA: Insufficient documentation

## 2016-06-07 DIAGNOSIS — Z7952 Long term (current) use of systemic steroids: Secondary | ICD-10-CM

## 2016-06-07 DIAGNOSIS — R9431 Abnormal electrocardiogram [ECG] [EKG]: Secondary | ICD-10-CM | POA: Insufficient documentation

## 2016-06-07 DIAGNOSIS — E785 Hyperlipidemia, unspecified: Secondary | ICD-10-CM

## 2016-06-07 DIAGNOSIS — I5042 Chronic combined systolic (congestive) and diastolic (congestive) heart failure: Secondary | ICD-10-CM | POA: Insufficient documentation

## 2016-06-07 DIAGNOSIS — Z86718 Personal history of other venous thrombosis and embolism: Secondary | ICD-10-CM | POA: Insufficient documentation

## 2016-06-07 DIAGNOSIS — Z9115 Patient's noncompliance with renal dialysis: Secondary | ICD-10-CM | POA: Diagnosis not present

## 2016-06-07 DIAGNOSIS — K219 Gastro-esophageal reflux disease without esophagitis: Secondary | ICD-10-CM

## 2016-06-07 DIAGNOSIS — Z79899 Other long term (current) drug therapy: Secondary | ICD-10-CM | POA: Insufficient documentation

## 2016-06-07 DIAGNOSIS — Z7901 Long term (current) use of anticoagulants: Secondary | ICD-10-CM | POA: Insufficient documentation

## 2016-06-07 DIAGNOSIS — R079 Chest pain, unspecified: Secondary | ICD-10-CM | POA: Diagnosis not present

## 2016-06-07 DIAGNOSIS — I428 Other cardiomyopathies: Secondary | ICD-10-CM | POA: Insufficient documentation

## 2016-06-07 DIAGNOSIS — Z87891 Personal history of nicotine dependence: Secondary | ICD-10-CM | POA: Insufficient documentation

## 2016-06-07 DIAGNOSIS — N19 Unspecified kidney failure: Secondary | ICD-10-CM | POA: Diagnosis present

## 2016-06-07 DIAGNOSIS — D631 Anemia in chronic kidney disease: Secondary | ICD-10-CM

## 2016-06-07 DIAGNOSIS — I120 Hypertensive chronic kidney disease with stage 5 chronic kidney disease or end stage renal disease: Secondary | ICD-10-CM | POA: Diagnosis not present

## 2016-06-07 DIAGNOSIS — N186 End stage renal disease: Secondary | ICD-10-CM | POA: Insufficient documentation

## 2016-06-07 DIAGNOSIS — E875 Hyperkalemia: Secondary | ICD-10-CM

## 2016-06-07 DIAGNOSIS — Z992 Dependence on renal dialysis: Secondary | ICD-10-CM | POA: Insufficient documentation

## 2016-06-07 DIAGNOSIS — I132 Hypertensive heart and chronic kidney disease with heart failure and with stage 5 chronic kidney disease, or end stage renal disease: Secondary | ICD-10-CM | POA: Insufficient documentation

## 2016-06-07 DIAGNOSIS — R0602 Shortness of breath: Secondary | ICD-10-CM | POA: Diagnosis not present

## 2016-06-07 DIAGNOSIS — I2510 Atherosclerotic heart disease of native coronary artery without angina pectoris: Secondary | ICD-10-CM | POA: Insufficient documentation

## 2016-06-07 DIAGNOSIS — R072 Precordial pain: Secondary | ICD-10-CM | POA: Diagnosis present

## 2016-06-07 LAB — BASIC METABOLIC PANEL
ANION GAP: 12 (ref 5–15)
Anion gap: 12 (ref 5–15)
BUN: 48 mg/dL — AB (ref 6–20)
BUN: 32 mg/dL — AB (ref 6–20)
CHLORIDE: 101 mmol/L (ref 101–111)
CO2: 26 mmol/L (ref 22–32)
CO2: 25 mmol/L (ref 22–32)
CREATININE: 13.85 mg/dL — AB (ref 0.61–1.24)
Calcium: 7.7 mg/dL — ABNORMAL LOW (ref 8.9–10.3)
Calcium: 8 mg/dL — ABNORMAL LOW (ref 8.9–10.3)
Chloride: 98 mmol/L — ABNORMAL LOW (ref 101–111)
Creatinine, Ser: 10.19 mg/dL — ABNORMAL HIGH (ref 0.61–1.24)
GFR calc Af Amer: 4 mL/min — ABNORMAL LOW (ref >60)
GFR calc Af Amer: 6 mL/min — ABNORMAL LOW (ref >60)
GFR calc non Af Amer: 4 mL/min — ABNORMAL LOW (ref >60)
GFR, EST NON AFRICAN AMERICAN: 5 mL/min — AB (ref >60)
GLUCOSE: 99 mg/dL (ref 65–99)
GLUCOSE: 128 mg/dL — AB (ref 65–99)
POTASSIUM: 6.1 mmol/L — AB (ref 3.5–5.1)
POTASSIUM: 6 mmol/L — AB (ref 3.5–5.1)
Sodium: 139 mmol/L (ref 135–145)
Sodium: 135 mmol/L (ref 135–145)

## 2016-06-07 LAB — CBC
HEMATOCRIT: 28.2 % — AB (ref 39.0–52.0)
HEMATOCRIT: 28.8 % — AB (ref 39.0–52.0)
HEMOGLOBIN: 8.7 g/dL — AB (ref 13.0–17.0)
HEMOGLOBIN: 9.1 g/dL — AB (ref 13.0–17.0)
MCH: 27.9 pg (ref 26.0–34.0)
MCH: 28.5 pg (ref 26.0–34.0)
MCHC: 30.9 g/dL (ref 30.0–36.0)
MCHC: 31.6 g/dL (ref 30.0–36.0)
MCV: 90.4 fL (ref 78.0–100.0)
MCV: 90.3 fL (ref 78.0–100.0)
Platelets: 183 10*3/uL (ref 150–400)
Platelets: 189 10*3/uL (ref 150–400)
RBC: 3.12 MIL/uL — AB (ref 4.22–5.81)
RBC: 3.19 MIL/uL — ABNORMAL LOW (ref 4.22–5.81)
RDW: 17.2 % — ABNORMAL HIGH (ref 11.5–15.5)
RDW: 17.2 % — AB (ref 11.5–15.5)
WBC: 5.5 10*3/uL (ref 4.0–10.5)
WBC: 4.7 10*3/uL (ref 4.0–10.5)

## 2016-06-07 LAB — I-STAT TROPONIN, ED: Troponin i, poc: 0.03 ng/mL (ref 0.00–0.08)

## 2016-06-07 MED ORDER — DARBEPOETIN ALFA 150 MCG/0.3ML IJ SOSY
150.0000 ug | PREFILLED_SYRINGE | Freq: Once | INTRAMUSCULAR | Status: AC
  Administered 2016-06-07: 150 ug via INTRAVENOUS

## 2016-06-07 MED ORDER — DARBEPOETIN ALFA 150 MCG/0.3ML IJ SOSY
PREFILLED_SYRINGE | INTRAMUSCULAR | Status: AC
  Filled 2016-06-07: qty 0.3

## 2016-06-07 MED ORDER — SODIUM POLYSTYRENE SULFONATE 15 GM/60ML PO SUSP
30.0000 g | Freq: Once | ORAL | Status: AC
  Administered 2016-06-08: 30 g via ORAL
  Filled 2016-06-07: qty 120

## 2016-06-07 MED ORDER — SODIUM CHLORIDE 0.9 % IV SOLN
1.0000 g | Freq: Once | INTRAVENOUS | Status: AC
  Administered 2016-06-07: 1 g via INTRAVENOUS
  Filled 2016-06-07: qty 10

## 2016-06-07 MED ORDER — SODIUM BICARBONATE 8.4 % IV SOLN
50.0000 meq | Freq: Once | INTRAVENOUS | Status: AC
  Administered 2016-06-08: 50 meq via INTRAVENOUS
  Filled 2016-06-07: qty 50

## 2016-06-07 MED ORDER — SODIUM CHLORIDE 0.9 % IV SOLN
1.0000 g | Freq: Once | INTRAVENOUS | Status: AC
  Administered 2016-06-08: 1 g via INTRAVENOUS
  Filled 2016-06-07: qty 10

## 2016-06-07 MED ORDER — SODIUM CHLORIDE 0.9 % IV SOLN
510.0000 mg | Freq: Once | INTRAVENOUS | Status: DC
  Filled 2016-06-07: qty 17

## 2016-06-07 NOTE — ED Provider Notes (Signed)
[facility] DEPT Provider Note   CSN: [id number] Arrival date & time: 06/07/16  0233     History   Chief Complaint Chief Complaint  Patient presents with  . Vascular Access Problem    HPI Frank [name] is a 50 y.o. male.  HPI  50 y.o. male with a hx of Hypokalemia, ESRD, HTN, and DM2, presents to the Emergency Department today for Dialysis. Notes HTN at home. No active CP/SOB/ABD pain. No N/V/D. No fevers. Notes last full dialysis was on Monday night/Tuesday morning. Has port on right anterior chest. Does not have established Dialysis Center yet and comes to ED for treatments. Does not have nephrologist per patient. No other symptoms noted.   Past Medical History:  Diagnosis Date  . Anemia   . Anxiety   . Chronic combined systolic and diastolic CHF (congestive heart failure) (HCC)    a. EF 20-25% by echo in 08/2015 b. echo 10/2015: EF 35-40%, diffuse HK, severe LAE, moderate RAE, small pericardial effusion  . Complication of anesthesia    itching, sore throat  . Depression   . Dialysis patient ([place])   . ESRD (end stage renal disease) ([place])    due to HTN per patient, followed at [facility], s/p failed kidney transplant - dialysis Tue, Th, Sat  . Hyperkalemia 12/2015  . Hypertension   . Junctional rhythm    a. noted in 08/2015: hyperkalemic at that time  b. 12/2015: presented in junctional rhythm w/ K+ of 6.6. Resolved with improvement of K+ levels.  . Nonischemic cardiomyopathy ([place])    a. 08/2014: cath showing minimal CAD, but tortuous arteries noted.   . Personal history of DVT (deep vein thrombosis)/ PE 05/26/2016   In Oct 2015 had small subsemental LUL PE w/o DVT (LE dopplers neg) and was felt to be HD cath related, treated w coumadin.  IN May 2016 had small vein DVT (acute/subacute) in the R basilic/ brachial veins of the RUE, resumed on coumadin.  Had R sided HD cath at that time.    . Renal insufficiency   . Shortness of breath   . Type II diabetes mellitus (HCC)    No  history per patient, but remains under history as A1c would not be accurate given on dialysis    Patient Active Problem List   Diagnosis Date Noted  . Hyperkalemia 05/29/2016  . Chronic back pain 05/29/2016  . GERD (gastroesophageal reflux disease) 05/29/2016  . Personal history of DVT (deep vein thrombosis)/ PE 05/26/2016  . Bradycardia 03/31/2016  . Nonischemic cardiomyopathy ([place]) 01/09/2016  . Bilateral low back pain without sciatica   . Left renal mass 10/30/2015  . Constipation 10/30/2015  . Adjustment disorder with mixed anxiety and depressed mood 08/20/2015  . Chronic epididymitis 06/19/2015  . Groin pain, chronic, right 06/19/2015  . Incarcerated right inguinal hernia 02/16/2015  . Essential hypertension 01/02/2015  . Dyslipidemia   . ESRD on hemodialysis ([place])   . Anemia of chronic kidney failure 06/24/2013    Past Surgical History:  Procedure Laterality Date  . CAPD INSERTION    . CAPD REMOVAL    . INGUINAL HERNIA REPAIR Right 02/14/2015   Procedure: REPAIR INCARCERATED RIGHT INGUINAL HERNIA;  Surgeon: [name], MD;  Location: [place];  Service: General;  Laterality: Right;  . INSERTION OF DIALYSIS CATHETER Right 09/23/2015   Procedure: exchange of Right internal Dialysis Catheter.;  Surgeon: [name], MD;  Location: [place];  Service: Vascular;  Laterality: Right;  . KIDNEY RECEIPIENT  2006   failed and started HD in March 2014  . LEFT HEART CATHETERIZATION WITH CORONARY ANGIOGRAM N/A 09/02/2014   Procedure: LEFT HEART CATHETERIZATION WITH CORONARY ANGIOGRAM;  Surgeon: [name], MD;  Location: [facility] CATH LAB;  Service: Cardiovascular;  Laterality: N/A;       Home Medications    Prior to Admission medications   Medication Sig Start Date End Date Taking? Authorizing Provider  allopurinol (ZYLOPRIM) 100 MG tablet Take 100 mg by mouth daily.    Historical Provider, MD  amLODipine (NORVASC) 10 MG tablet Take 10 mg by mouth daily.    Historical Provider, MD    atorvastatin (LIPITOR) 40 MG tablet Take 1 tablet (40 mg total) by mouth daily at 6 PM. 09/02/14   Barton Dubois, MD  carvedilol (COREG) 25 MG tablet Take 25 mg by mouth 2 (two) times daily with a meal.    Historical Provider, MD  cinacalcet (SENSIPAR) 30 MG tablet Take 30 mg by mouth daily.    Historical Provider, MD  cloNIDine (CATAPRES - DOSED IN MG/24 HR) 0.3 mg/24hr patch Place 0.3 mg onto the skin once a week.    Historical Provider, MD  doxazosin (CARDURA) 2 MG tablet Take 2 mg by mouth daily.    Historical Provider, MD  famotidine (PEPCID) 20 MG tablet Take 1 tablet (20 mg total) by mouth 2 (two) times daily. 02/13/16   Charlesetta Shanks, MD  gabapentin (NEURONTIN) 100 MG capsule Take 1 capsule (100 mg total) by mouth at bedtime. 05/21/16   [name], DO  hydrALAZINE (APRESOLINE) 100 MG tablet Take 100 mg by mouth 2 (two) times daily.     Historical Provider, MD  isosorbide mononitrate (IMDUR) 60 MG 24 hr tablet Take 1 tablet (60 mg total) by mouth daily. 01/09/16   Smiley Houseman, MD  omeprazole (PRILOSEC) 20 MG capsule Take 20 mg by mouth daily. 07/01/13   Historical Provider, MD  ondansetron (ZOFRAN ODT) 4 MG disintegrating tablet Take 1 tablet (4 mg total) by mouth every 8 (eight) hours as needed for nausea or vomiting. 05/16/16   Sherwood Gambler, MD  oxycodone (OXY-IR) 5 MG capsule Take 1 capsule (5 mg total) by mouth every 4 (four) hours as needed for pain. 05/30/16   [name], MD  predniSONE (DELTASONE) 10 MG tablet Take 10 mg by mouth daily with breakfast.    Historical Provider, MD  sevelamer carbonate (RENVELA) 800 MG tablet Take 800-1,600 mg by mouth 3 (three) times daily with meals. 2 tabs three times daily with meals, and 1 tablet with snacks    Historical Provider, MD  warfarin (COUMADIN) 2 MG tablet Take 2 mg by mouth daily. Take with 49m tablet to equal 716m7/12/17   Historical Provider, MD  warfarin (COUMADIN) 5 MG tablet Take 5 mg by mouth daily. Take with 53m39mablet  to equal 7mg13m12/17   Historical Provider, MD    Family History Family History  Problem Relation Age of Onset  . Hypertension Other     Social History Social History  Substance Use Topics  . Smoking status: Former Smoker    Packs/day: 0.00    Years: 1.00    Types: Cigarettes  . Smokeless tobacco: Never Used     Comment: quit Jan 2014  . Alcohol use No     Allergies   Butalbital-apap-caffeine; Ferrlecit [na ferric gluc cplx in sucrose]; Minoxidil; Darvocet [propoxyphene n-acetaminophen]; and Other   Review of Systems Review of Systems ROS reviewed  and all are negative for acute change except as noted in the HPI.  Physical Exam Updated Vital Signs BP (!) 188/103 (BP Location: Right Arm)   Pulse 70   Temp 98.2 F (36.8 C) (Oral)   Resp 20   Ht _0  (1.88 m)   Wt 75.8 kg   SpO2 98%   BMI 21.44 kg/m   Physical Exam  Constitutional: He is oriented to person, place, and time. Vital signs are normal. He appears well-developed and well-nourished.  HENT:  Head: Normocephalic.  Right Ear: Hearing normal.  Left Ear: Hearing normal.  Eyes: Conjunctivae and EOM are normal. Pupils are equal, round, and reactive to light.  Neck: Normal range of motion. Neck supple.  Cardiovascular: Normal rate, regular rhythm, normal heart sounds and intact distal pulses.   Pulmonary/Chest: Effort normal and breath sounds normal.  Port on Right Anterior chest  Musculoskeletal: Normal range of motion.  Neurological: He is alert and oriented to person, place, and time.  Skin: Skin is warm and dry.  Psychiatric: He has a normal mood and affect. His speech is normal and behavior is normal. Thought content normal.  Nursing note and vitals reviewed.  ED Treatments / Results  Labs (all labs ordered are listed, but only abnormal results are displayed) Labs Reviewed  CBC - Abnormal; Notable for the following:       Result Value   RBC 3.12 (*)    Hemoglobin 8.7 (*)    HCT 28.2 (*)    RDW  17.2 (*)    All other components within normal limits  BASIC METABOLIC PANEL - Abnormal; Notable for the following:    Potassium 6.1 (*)    BUN 48 (*)    Creatinine, Ser 13.85 (*)    Calcium 7.7 (*)    GFR calc non Af Amer 4 (*)    GFR calc Af Amer 4 (*)    All other components within normal limits    EKG  EKG Interpretation  Date/Time:  Friday June 07 2016 08:30:15 EST Ventricular Rate:  66 PR Interval:    QRS Duration: 97 QT Interval:  479 QTC Calculation: 502 R Axis:   -59 Text Interpretation:  Sinus rhythm Left anterior fascicular block Repol abnrm suggests ischemia, lateral leads Prolonged QT interval Baseline wander in lead(s) V3 Confirmed by [facility] MD, Corene Cornea ([id number]) on 06/07/2016 9:41:03 AM       Radiology No results found.  Procedures Procedures (including critical care time)  Medications Ordered in ED Medications - No data to display   Initial Impression / Assessment and Plan / ED Course  I have reviewed the triage vital signs and the nursing notes.  Pertinent labs & imaging results that were available during my care of the patient were reviewed by me and considered in my medical decision making (see chart for details).  Clinical Course    Final Clinical Impressions(s) / ED Diagnoses  I have reviewed and evaluated the relevant laboratory values. I have interpreted the relevant EKG. I have reviewed the relevant previous healthcare records.I obtained HPI from historian. Patient discussed with supervising physician  ED Course:  Assessment: Pt is a 32yM with hx Hypokalemia, ESRD, HTN, and DM2 who presents for Dialysis. Last treatment on Tuesday. No symptoms currently. No CP/SOB/ABD pain. Noted Hypertensive this AM. Does not have establish Dialysis center yet. On exam, pt in NAD. Nontoxic/nonseptic appearing. VSS. Afebrile. Lungs CTA. Heart RRR. Abdomen nontender soft. CBC unremarkable. BMP shows hyperkalemia at 6.1 EKG showed  prolonged QT and flat T waves.  Given Calcium gluconate. Consult to Nephrology (Dr. Melvia Heaps) will Dialyze today.   Disposition/Plan:  Dialysis Pt acknowledges and agrees with plan  Supervising Physician Merrily Pew, MD   Final diagnoses:  Hyperkalemia  ESRD (end stage renal disease) [facility])    New Prescriptions New Prescriptions   No medications on file     Shary Decamp, PA-C 06/07/16 [id number]    Merrily Pew, MD 06/08/16 [phone]

## 2016-06-07 NOTE — ED Triage Notes (Signed)
C/o aching pain to center of chest since dialysis today.  Denies sob, nausea, vomiting, or any other symptoms.

## 2016-06-07 NOTE — ED Notes (Signed)
Placed order for pt breakfast tray

## 2016-06-07 NOTE — Procedures (Addendum)
Patient presented to ED requesting inpt HD.  Has no OP unit as of this time and trying to get accepted in [facility] unit soon.  Has AVF maturing.  Plan HD today, max UF , 3.5 hours.  He is anemic, will give 150 ug darbe (none this year here) and 1 dose 510 mg Feraheme.  He got Feraheme in April so should be ok to give (has ferric gluconate allergy).      I was present at this dialysis session, have reviewed the session itself and made  appropriate changes Kelly Splinter MD [place] pager [phone]   06/07/2016, 2:20 PM

## 2016-06-07 NOTE — Progress Notes (Signed)
[name] came to [facility] today for Hemodialysis tx. 3 hrs and 30 min. Was the ordered tx, however , he signed AMA with 50 min remaining time to go. There was some clotting in the lines and dialyzer;  when he was told that the system will be changed, and his tx will be resumed after fresh system is set up, pt disagreed to continue HD. Pt. Signed AMA. Discharged stable. Ambulatory, walked out of the HD clinic by himself.

## 2016-06-07 NOTE — ED Provider Notes (Signed)
[facility] DEPT Provider Note   CSN: [id number] Arrival date & time: 06/07/16  2048  By signing my name below, I, Gean Quint, attest that this documentation has been prepared under the direction and in the presence of Varney Biles, MD. Electronically Signed: Gean Quint, ED Scribe. 06/07/16. 1:14 AM.   History   Chief Complaint Chief Complaint  Patient presents with  . Chest Pain     The history is provided by the patient. No language interpreter was used.    HPI Comments: KENTLEY BLYDEN is a 50 y.o. male with a history of CHF, who presents to the Emergency Department complaining of non-radiating mid substernal chest pain which began 2 hours into his 3.5 hr dialysis session today (receives dialysis on M,W,F). His pain is constant and describes as aching pain. He denies h/o same pain. Pt states that pain worsens with deep breaths and with palpation of the chest and movement. Pt reports that he takes coumadin irregularly but takes everything else that is prescribed correctly.  Pt notes that nausea stops him from taking the medication. Pt denies nausea at this time, vomiting, dizziness/lightheadedness, cough, fever, diaphoresis, and SOB.   Past Medical History:  Diagnosis Date  . Anemia   . Anxiety   . Chronic combined systolic and diastolic CHF (congestive heart failure) (HCC)    a. EF 20-25% by echo in 08/2015 b. echo 10/2015: EF 35-40%, diffuse HK, severe LAE, moderate RAE, small pericardial effusion  . Complication of anesthesia    itching, sore throat  . Depression   . Dialysis patient ([place])   . ESRD (end stage renal disease) ([place])    due to HTN per patient, followed at [facility], s/p failed kidney transplant - dialysis Tue, Th, Sat  . Hyperkalemia 12/2015  . Hypertension   . Junctional rhythm    a. noted in 08/2015: hyperkalemic at that time  b. 12/2015: presented in junctional rhythm w/ K+ of 6.6. Resolved with improvement of K+ levels.  . Nonischemic cardiomyopathy  ([place])    a. 08/2014: cath showing minimal CAD, but tortuous arteries noted.   . Personal history of DVT (deep vein thrombosis)/ PE 05/26/2016   In Oct 2015 had small subsemental LUL PE w/o DVT (LE dopplers neg) and was felt to be HD cath related, treated w coumadin.  IN May 2016 had small vein DVT (acute/subacute) in the R basilic/ brachial veins of the RUE, resumed on coumadin.  Had R sided HD cath at that time.    . Renal insufficiency   . Shortness of breath   . Type II diabetes mellitus (HCC)    No history per patient, but remains under history as A1c would not be accurate given on dialysis    Patient Active Problem List   Diagnosis Date Noted  . Uremia 06/07/2016  . Hyperkalemia 05/29/2016  . Chronic back pain 05/29/2016  . GERD (gastroesophageal reflux disease) 05/29/2016  . Personal history of DVT (deep vein thrombosis)/ PE 05/26/2016  . Bradycardia 03/31/2016  . Nonischemic cardiomyopathy ([place]) 01/09/2016  . Bilateral low back pain without sciatica   . Left renal mass 10/30/2015  . Constipation 10/30/2015  . Adjustment disorder with mixed anxiety and depressed mood 08/20/2015  . Chronic epididymitis 06/19/2015  . Groin pain, chronic, right 06/19/2015  . Incarcerated right inguinal hernia 02/16/2015  . Essential hypertension 01/02/2015  . Dyslipidemia   . ESRD on hemodialysis ([place])   . Anemia of chronic kidney failure 06/24/2013    Past Surgical History:  Procedure Laterality Date  . CAPD INSERTION    . CAPD REMOVAL    . INGUINAL HERNIA REPAIR Right 02/14/2015   Procedure: REPAIR INCARCERATED RIGHT INGUINAL HERNIA;  Surgeon: [name], MD;  Location: [facility];  Service: General;  Laterality: Right;  . INSERTION OF DIALYSIS CATHETER Right 09/23/2015   Procedure: exchange of Right internal Dialysis Catheter.;  Surgeon: [name], MD;  Location: [facility];  Service: Vascular;  Laterality: Right;  . KIDNEY RECEIPIENT  2006   failed and started HD in March 2014  . LEFT HEART  CATHETERIZATION WITH CORONARY ANGIOGRAM N/A 09/02/2014   Procedure: LEFT HEART CATHETERIZATION WITH CORONARY ANGIOGRAM;  Surgeon: [name], MD;  Location: [facility] CATH LAB;  Service: Cardiovascular;  Laterality: N/A;       Home Medications    Prior to Admission medications   Medication Sig Start Date End Date Taking? Authorizing Provider  allopurinol (ZYLOPRIM) 100 MG tablet Take 100 mg by mouth daily.   Yes Historical Provider, MD  amLODipine (NORVASC) 10 MG tablet Take 10 mg by mouth daily.   Yes Historical Provider, MD  atorvastatin (LIPITOR) 40 MG tablet Take 1 tablet (40 mg total) by mouth daily at 6 PM. 09/02/14  Yes Barton Dubois, MD  carvedilol (COREG) 25 MG tablet Take 25 mg by mouth 2 (two) times daily with a meal.   Yes Historical Provider, MD  cinacalcet (SENSIPAR) 30 MG tablet Take 30 mg by mouth daily.   Yes Historical Provider, MD  cloNIDine (CATAPRES - DOSED IN MG/24 HR) 0.3 mg/24hr patch Place 0.3 mg onto the skin once a week.   Yes Historical Provider, MD  doxazosin (CARDURA) 2 MG tablet Take 2 mg by mouth daily.   Yes Historical Provider, MD  famotidine (PEPCID) 20 MG tablet Take 1 tablet (20 mg total) by mouth 2 (two) times daily. 02/13/16  Yes Charlesetta Shanks, MD  gabapentin (NEURONTIN) 100 MG capsule Take 1 capsule (100 mg total) by mouth at bedtime. Patient taking differently: Take 100 mg by mouth 3 (three) times daily.  05/21/16  Yes [name], DO  hydrALAZINE (APRESOLINE) 100 MG tablet Take 100 mg by mouth 2 (two) times daily.    Yes Historical Provider, MD  isosorbide mononitrate (IMDUR) 60 MG 24 hr tablet Take 1 tablet (60 mg total) by mouth daily. 01/09/16  Yes Smiley Houseman, MD  omeprazole (PRILOSEC) 20 MG capsule Take 20 mg by mouth daily. 07/01/13  Yes Historical Provider, MD  ondansetron (ZOFRAN ODT) 4 MG disintegrating tablet Take 1 tablet (4 mg total) by mouth every 8 (eight) hours as needed for nausea or vomiting. 05/16/16  Yes Sherwood Gambler, MD    oxycodone (OXY-IR) 5 MG capsule Take 1 capsule (5 mg total) by mouth every 4 (four) hours as needed for pain. 05/30/16  Yes [name], MD  predniSONE (DELTASONE) 10 MG tablet Take 10 mg by mouth daily with breakfast.   Yes Historical Provider, MD  sevelamer carbonate (RENVELA) 800 MG tablet Take 800-1,600 mg by mouth See admin instructions. Take 1600 mg by mouth 3 times daily with meals and take 800 mg by mouth with snacks.   Yes Historical Provider, MD  warfarin (COUMADIN) 2 MG tablet Take 2 mg by mouth daily. Take with 15m tablet to equal 793m7/12/17  Yes Historical Provider, MD  warfarin (COUMADIN) 5 MG tablet Take 5 mg by mouth daily. Take with 17m59mablet to equal 7mg37m12/17   Historical Provider, MD  Family History Family History  Problem Relation Age of Onset  . Hypertension Other     Social History Social History  Substance Use Topics  . Smoking status: Former Smoker    Packs/day: 0.00    Years: 1.00    Types: Cigarettes  . Smokeless tobacco: Never Used     Comment: quit Jan 2014  . Alcohol use No     Allergies   Butalbital-apap-caffeine; Ferrlecit [na ferric gluc cplx in sucrose]; Minoxidil; Darvocet [propoxyphene n-acetaminophen]; and Other   Review of Systems Review of Systems  A complete 10 system review of systems was obtained and all systems are negative except as noted in the HPI and PMH.    Physical Exam Updated Vital Signs BP 161/93   Pulse 64   Temp 98.4 F (36.9 C) (Oral)   Resp 21   SpO2 99%   Physical Exam  Constitutional: He appears well-developed and well-nourished.  HENT:  Head: Normocephalic and atraumatic.  Eyes: Conjunctivae are normal.  Neck: Neck supple. JVD present.  Positive JVD  Cardiovascular: Normal rate and regular rhythm.   No murmur heard. Pulmonary/Chest: Effort normal and breath sounds normal. No respiratory distress.  Right thoracic catherter with no surrounding erythema, Bibasilar rales appreciated CP is  reproducible with palpations of the chest wall Pt is supine during exam  Abdominal: Soft. There is no tenderness.  Musculoskeletal: He exhibits no edema.  No pitting edema, No unilateral calf tenderness/swelling   Neurological: He is alert.  Skin: Skin is warm and dry.  Psychiatric: He has a normal mood and affect.  Nursing note and vitals reviewed.    ED Treatments / Results  DIAGNOSTIC STUDIES: Oxygen Saturation is 100% on RA, normal by my interpretation.    COORDINATION OF CARE: 11:35 AM Discussed treatment plan with pt at bedside and pt agreed to plan.  Labs (all labs ordered are listed, but only abnormal results are displayed) Labs Reviewed  BASIC METABOLIC PANEL - Abnormal; Notable for the following:       Result Value   Potassium 6.0 (*)    Chloride 98 (*)    Glucose, Bld 128 (*)    BUN 32 (*)    Creatinine, Ser 10.19 (*)    Calcium 8.0 (*)    GFR calc non Af Amer 5 (*)    GFR calc Af Amer 6 (*)    All other components within normal limits  CBC - Abnormal; Notable for the following:    RBC 3.19 (*)    Hemoglobin 9.1 (*)    HCT 28.8 (*)    RDW 17.2 (*)    All other components within normal limits  I-STAT TROPOININ, ED    EKG  EKG Interpretation None       Radiology Dg Chest 2 View  Result Date: 06/07/2016 CLINICAL DATA:  Shortness of breath and chest pain EXAM: CHEST  2 VIEW COMPARISON:  May 17, 2016 FINDINGS: There is no edema or consolidation. Heart is borderline prominent with pulmonary vascularity within normal limits. There is atherosclerotic calcification aorta. No adenopathy. No bone lesions. No pneumothorax. Central catheter tip is in the superior vena cava near the cavoatrial junction. IMPRESSION: Central catheter tip in superior vena cava near cavoatrial junction. No pneumothorax. No edema or consolidation. Heart borderline prominent. Electronically Signed   By: Lowella Grip III M.D.   On: 06/07/2016 21:30    Procedures .Critical  Care Performed by: Varney Biles Authorized by: Varney Biles   Critical care provider statement:  Critical care time (minutes):  40   Critical care time was exclusive of:  Separately billable procedures and treating other patients   Critical care was necessary to treat or prevent imminent or life-threatening deterioration of the following conditions:  Metabolic crisis   Critical care was time spent personally by me on the following activities:  Blood draw for specimens, development of treatment plan with patient or surrogate, discussions with consultants, obtaining history from patient or surrogate, ordering and performing treatments and interventions, ordering and review of laboratory studies, ordering and review of radiographic studies, re-evaluation of patient's condition and review of old charts   (including critical care time)  Medications Ordered in ED Medications  calcium gluconate 1 g in sodium chloride 0.9 % 100 mL IVPB (0 g Intravenous Stopped 06/08/16 0300)  sodium bicarbonate injection 50 mEq (50 mEq Intravenous Given 06/08/16 0034)  sodium polystyrene (KAYEXALATE) 15 GM/60ML suspension 30 g (30 g Oral Given 06/08/16 0033)     Initial Impression / Assessment and Plan / ED Course  I have reviewed the triage vital signs and the nursing notes.  Pertinent labs & imaging results that were available during my care of the patient were reviewed by me and considered in my medical decision making (see chart for details).  Clinical Course     Pt comes in with cc of chest pain. Pt also reports not completing his HD.  His pain is not cardiac in nature and it started while he was on HD. Initial trop is neg. EKG has no acute ischemic signs. Pt is not profoundly fluid overloaded. Pt does have however hyperkalemia of 6 with slightly prolonged QTc. We will keep him on cardiac monitor. Temporizing meds given.   12:12 AM Discussed case with Dr. Hollie Salk (nephrology) states pt will be  dialyzed tomorrow morning.   Final Clinical Impressions(s) / ED Diagnoses   Final diagnoses:  Dialysis patient, noncompliant ([place])  Acute hyperkalemia  Prolonged Q-T interval on ECG    New Prescriptions New Prescriptions   No medications on file   I personally performed the services described in this documentation, which was scribed in my presence. The recorded information has been reviewed and is accurate.     Varney Biles, MD 06/08/16 [phone]

## 2016-06-07 NOTE — ED Triage Notes (Signed)
Pt presents for dialysis today. Pt. Reports HTN at home. Pt. States chronic back pain, pt. Ambulatory to room in NAD. Pt. Reports last dialysis txt Tuesday.

## 2016-06-08 LAB — RENAL FUNCTION PANEL
Albumin: 2.9 g/dL — ABNORMAL LOW (ref 3.5–5.0)
Anion gap: 11 (ref 5–15)
BUN: 36 mg/dL — AB (ref 6–20)
CHLORIDE: 98 mmol/L — AB (ref 101–111)
CO2: 28 mmol/L (ref 22–32)
CREATININE: 11.8 mg/dL — AB (ref 0.61–1.24)
Calcium: 8 mg/dL — ABNORMAL LOW (ref 8.9–10.3)
GFR calc Af Amer: 5 mL/min — ABNORMAL LOW (ref >60)
GFR, EST NON AFRICAN AMERICAN: 4 mL/min — AB (ref >60)
Glucose, Bld: 129 mg/dL — ABNORMAL HIGH (ref 65–99)
Phosphorus: 5.2 mg/dL — ABNORMAL HIGH (ref 2.5–4.6)
Potassium: 5.6 mmol/L — ABNORMAL HIGH (ref 3.5–5.1)
Sodium: 137 mmol/L (ref 135–145)

## 2016-06-08 LAB — CBC
HCT: 28.1 % — ABNORMAL LOW (ref 39.0–52.0)
Hemoglobin: 8.8 g/dL — ABNORMAL LOW (ref 13.0–17.0)
MCH: 28.1 pg (ref 26.0–34.0)
MCHC: 31.3 g/dL (ref 30.0–36.0)
MCV: 89.8 fL (ref 78.0–100.0)
PLATELETS: 194 10*3/uL (ref 150–400)
RBC: 3.13 MIL/uL — ABNORMAL LOW (ref 4.22–5.81)
RDW: 17 % — AB (ref 11.5–15.5)
WBC: 4.7 10*3/uL (ref 4.0–10.5)

## 2016-06-08 MED ORDER — OXYCODONE HCL 5 MG PO TABS
5.0000 mg | ORAL_TABLET | Freq: Once | ORAL | Status: AC
  Administered 2016-06-08: 5 mg via ORAL
  Filled 2016-06-08: qty 1

## 2016-06-08 MED ORDER — CLONIDINE HCL 0.2 MG PO TABS
0.2000 mg | ORAL_TABLET | Freq: Once | ORAL | Status: AC
  Administered 2016-06-08: 0.2 mg via ORAL
  Filled 2016-06-08: qty 1

## 2016-06-08 MED ORDER — LIDOCAINE-PRILOCAINE 2.5-2.5 % EX CREA
1.0000 "application " | TOPICAL_CREAM | CUTANEOUS | Status: DC | PRN
  Filled 2016-06-08: qty 5

## 2016-06-08 MED ORDER — LIDOCAINE HCL (PF) 1 % IJ SOLN: 5.0000 mL | INTRAMUSCULAR | Status: DC | PRN

## 2016-06-08 MED ORDER — HEPARIN SODIUM (PORCINE) 1000 UNIT/ML DIALYSIS
1000.0000 [IU] | INTRAMUSCULAR | Status: DC | PRN
  Filled 2016-06-08: qty 1

## 2016-06-08 MED ORDER — ONDANSETRON HCL 4 MG/2ML IJ SOLN
4.0000 mg | Freq: Once | INTRAMUSCULAR | Status: AC
  Administered 2016-06-08: 4 mg via INTRAVENOUS
  Filled 2016-06-08: qty 2

## 2016-06-08 MED ORDER — PENTAFLUOROPROP-TETRAFLUOROETH EX AERO
1.0000 "application " | INHALATION_SPRAY | CUTANEOUS | Status: DC | PRN
  Filled 2016-06-08: qty 30

## 2016-06-08 MED ORDER — PENTAFLUOROPROP-TETRAFLUOROETH EX AERO: 1.0000 "application " | INHALATION_SPRAY | CUTANEOUS | Status: DC | PRN

## 2016-06-08 MED ORDER — ALTEPLASE 2 MG IJ SOLR: 2.0000 mg | Freq: Once | INTRAMUSCULAR | Status: DC | PRN

## 2016-06-08 MED ORDER — IBUPROFEN 400 MG PO TABS
600.0000 mg | ORAL_TABLET | Freq: Once | ORAL | Status: AC
  Administered 2016-06-08: 600 mg via ORAL
  Filled 2016-06-08: qty 1

## 2016-06-08 MED ORDER — HEPARIN SODIUM (PORCINE) 1000 UNIT/ML DIALYSIS
4000.0000 [IU] | INTRAMUSCULAR | Status: DC | PRN
  Administered 2016-06-08: 4000 [IU] via INTRAVENOUS_CENTRAL
  Filled 2016-06-08: qty 4

## 2016-06-08 MED ORDER — SODIUM CHLORIDE 0.9 % IV SOLN: 100.0000 mL | INTRAVENOUS | Status: DC | PRN

## 2016-06-08 MED ORDER — LIDOCAINE-PRILOCAINE 2.5-2.5 % EX CREA
1.0000 | TOPICAL_CREAM | CUTANEOUS | Status: DC | PRN
Start: 2016-06-08 — End: 2016-06-08

## 2016-06-08 NOTE — ED Notes (Signed)
Per nurse pt refused blood draw, will wait til receive dialysis

## 2016-06-08 NOTE — Procedures (Signed)
Patient seen and examined on Hemodialysis.  No OP dialysis unit.  Had HD yesterday but signed off after 2 hr 50 min.  Still hyperkalemic.  Pt has costochondritis as well, given ibuprofen.    Pt received 200 mcg Aranesp on 11/14 and per last HD proc note 150 mcg on 11/17.  510 mg feraheme 11/17. AVF with good bruit and thrill.  QB 400 UF goal 2.5L Treatment adjusted as needed.  Madelon Lips MD [facility] Cell [phone] pgr [phone] 1:58 PM

## 2016-06-08 NOTE — ED Notes (Signed)
Dr. Laverta Baltimore is aware of pt. Having chest pain since his arrival.  No orders received to give pain medication.  Pt. Will be going to dialysis.  Catalina Antigua, RN at Dialysis is  Aware of pt. Having chest pain.  Pt. Is eating breakfast .

## 2016-06-08 NOTE — Progress Notes (Signed)
Dialysis treatment completed.  2500 mL ultrafiltrated.  2000 mL net fluid removal.  Patient status unchanged. Lung sounds diminished to ausculation in all fields. Generalized edema. Cardiac: NSR.  Cleansed RIJ catheter with chlorhexidine.  Disconnected lines and flushed ports with saline per protocol.  Ports locked with heparin and capped per protocol.    Discharge teaching completed and patient discharged home per self post treatment.

## 2016-06-08 NOTE — ED Notes (Addendum)
PT Told me to "come back in 10 mins to repeat his EKG". Reported to MD long

## 2016-06-08 NOTE — Procedures (Signed)
STabel on HD now.  OK for dc home after HD.     I was present at this dialysis session, have reviewed the session itself and made  appropriate changes Kelly Splinter MD [facility] pager [phone]   06/08/2016, 3:59 PM

## 2016-06-08 NOTE — ED Notes (Signed)
IV would not draw back. Pt refused a stick. Stated they could get it in dialysis

## 2016-06-08 NOTE — ED Provider Notes (Signed)
Blood pressure (!) 181/102, pulse 64, temperature 98.4 F (36.9 C), temperature source Oral, resp. rate 10, SpO2 99 %.  Assuming care from Dr. Kathrynn Humble.  In short, Frank [name] is a 50 y.o. male with a chief complaint of Chest Pain .  Refer to the original H&P for additional details.  The current plan of care is to follow repeat EKG and follow in the ED until HD later today.  10:15 AM Reviewed repeat EKG. Patient refusing additional blood draw. Plan to transport for HD soon with discharge after HD.    Nanda Quinton, MD    Margette Fast, MD 06/08/16 [phone]

## 2016-06-08 NOTE — Progress Notes (Signed)
Patient arrived to unit by ED stretcher.  Reviewed treatment plan and this RN agrees with plan.  Report received from bedside RN, Cecille Rubin.  Consent obtained.  Patient A & O X 4.   Lung sounds diminished to ausculation in all fields. Generalized edema. Cardiac:  NSR.  Removed caps and cleansed RIJ catheter with chlorhedxidine.  Aspirated ports of heparin and flushed them with saline per protocol.  Connected and secured lines, initiated treatment at 1104.  UF Goal of 254m and net fluid removal 2L.  Will continue to monitor.

## 2016-06-08 NOTE — ED Notes (Signed)
Nurse drawing labs. 

## 2016-06-08 NOTE — ED Notes (Signed)
Explained to pt. That Dr. Laverta Baltimore did not want to give pt. Pain medication.  He felt that his chronic chest pain was due to not having his dialysis done yesterday and that the he will fill better after his treatment.  Pt. Verbalized understanding but was disappointed in not receiving pain medication.

## 2016-06-09 ENCOUNTER — Other Ambulatory Visit: Payer: Self-pay

## 2016-06-09 ENCOUNTER — Encounter (HOSPITAL_COMMUNITY): Payer: Self-pay

## 2016-06-09 ENCOUNTER — Emergency Department (HOSPITAL_COMMUNITY)
Admission: EM | Admit: 2016-06-09 | Discharge: 2016-06-10 | Disposition: A | Discharge status: Home / Self Care | Payer: Medicare Other | Attending: Emergency Medicine | Admitting: Emergency Medicine

## 2016-06-09 ENCOUNTER — Emergency Department (HOSPITAL_COMMUNITY): Payer: Medicare Other

## 2016-06-09 DIAGNOSIS — E875 Hyperkalemia: Secondary | ICD-10-CM | POA: Diagnosis not present

## 2016-06-09 DIAGNOSIS — Z87891 Personal history of nicotine dependence: Secondary | ICD-10-CM | POA: Diagnosis not present

## 2016-06-09 DIAGNOSIS — I5042 Chronic combined systolic (congestive) and diastolic (congestive) heart failure: Secondary | ICD-10-CM | POA: Insufficient documentation

## 2016-06-09 DIAGNOSIS — N186 End stage renal disease: Secondary | ICD-10-CM | POA: Insufficient documentation

## 2016-06-09 DIAGNOSIS — I120 Hypertensive chronic kidney disease with stage 5 chronic kidney disease or end stage renal disease: Secondary | ICD-10-CM | POA: Diagnosis not present

## 2016-06-09 DIAGNOSIS — Z7901 Long term (current) use of anticoagulants: Secondary | ICD-10-CM | POA: Diagnosis not present

## 2016-06-09 DIAGNOSIS — R0789 Other chest pain: Secondary | ICD-10-CM | POA: Diagnosis not present

## 2016-06-09 DIAGNOSIS — E1122 Type 2 diabetes mellitus with diabetic chronic kidney disease: Secondary | ICD-10-CM | POA: Insufficient documentation

## 2016-06-09 DIAGNOSIS — Z992 Dependence on renal dialysis: Secondary | ICD-10-CM | POA: Diagnosis not present

## 2016-06-09 DIAGNOSIS — I132 Hypertensive heart and chronic kidney disease with heart failure and with stage 5 chronic kidney disease, or end stage renal disease: Secondary | ICD-10-CM | POA: Insufficient documentation

## 2016-06-09 DIAGNOSIS — R072 Precordial pain: Secondary | ICD-10-CM | POA: Diagnosis present

## 2016-06-09 DIAGNOSIS — R079 Chest pain, unspecified: Secondary | ICD-10-CM | POA: Diagnosis not present

## 2016-06-09 LAB — BASIC METABOLIC PANEL
Anion gap: 11 (ref 5–15)
BUN: 31 mg/dL — AB (ref 6–20)
CHLORIDE: 99 mmol/L — AB (ref 101–111)
CO2: 28 mmol/L (ref 22–32)
CREATININE: 10.17 mg/dL — AB (ref 0.61–1.24)
Calcium: 8.4 mg/dL — ABNORMAL LOW (ref 8.9–10.3)
GFR calc Af Amer: 6 mL/min — ABNORMAL LOW (ref >60)
GFR, EST NON AFRICAN AMERICAN: 5 mL/min — AB (ref >60)
Glucose, Bld: 129 mg/dL — ABNORMAL HIGH (ref 65–99)
Potassium: 5.4 mmol/L — ABNORMAL HIGH (ref 3.5–5.1)
SODIUM: 138 mmol/L (ref 135–145)

## 2016-06-09 LAB — I-STAT TROPONIN, ED: Troponin i, poc: 0.03 ng/mL (ref 0.00–0.08)

## 2016-06-09 LAB — CBC
HCT: 31.9 % — ABNORMAL LOW (ref 39.0–52.0)
Hemoglobin: 10 g/dL — ABNORMAL LOW (ref 13.0–17.0)
MCH: 28.4 pg (ref 26.0–34.0)
MCHC: 31.3 g/dL (ref 30.0–36.0)
MCV: 90.6 fL (ref 78.0–100.0)
PLATELETS: 192 10*3/uL (ref 150–400)
RBC: 3.52 MIL/uL — ABNORMAL LOW (ref 4.22–5.81)
RDW: 17.2 % — AB (ref 11.5–15.5)
WBC: 4.3 10*3/uL (ref 4.0–10.5)

## 2016-06-09 IMAGING — CR DG CHEST 2V
2 series · 2 of 2 positions shown · non-contrast
Comparison: 01/17/2014.

CLINICAL DATA: Shortness of breath.  Insomnia.  Anxiety.

EXAM:
CHEST  2 VIEW

[w chest pa]
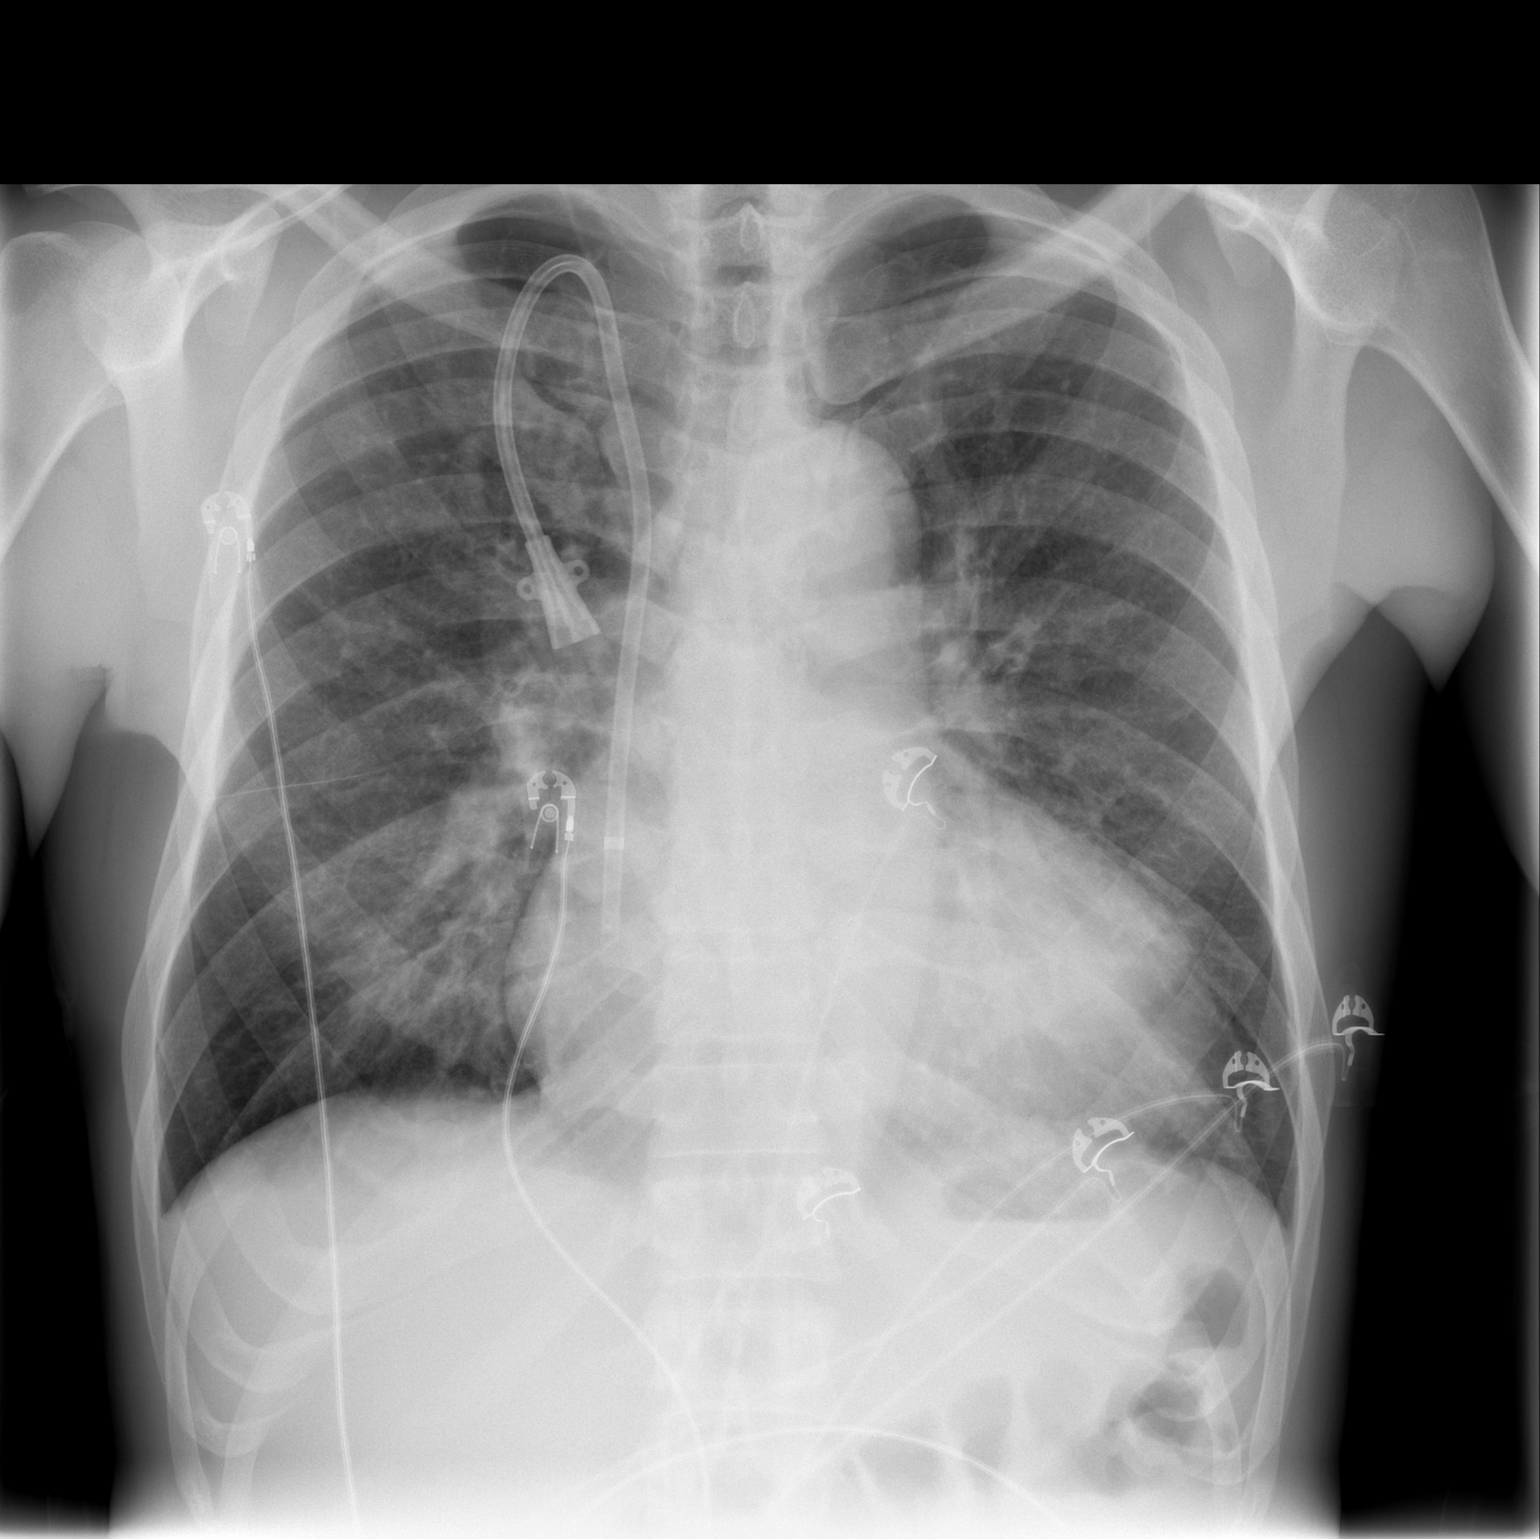

[w chest lat]
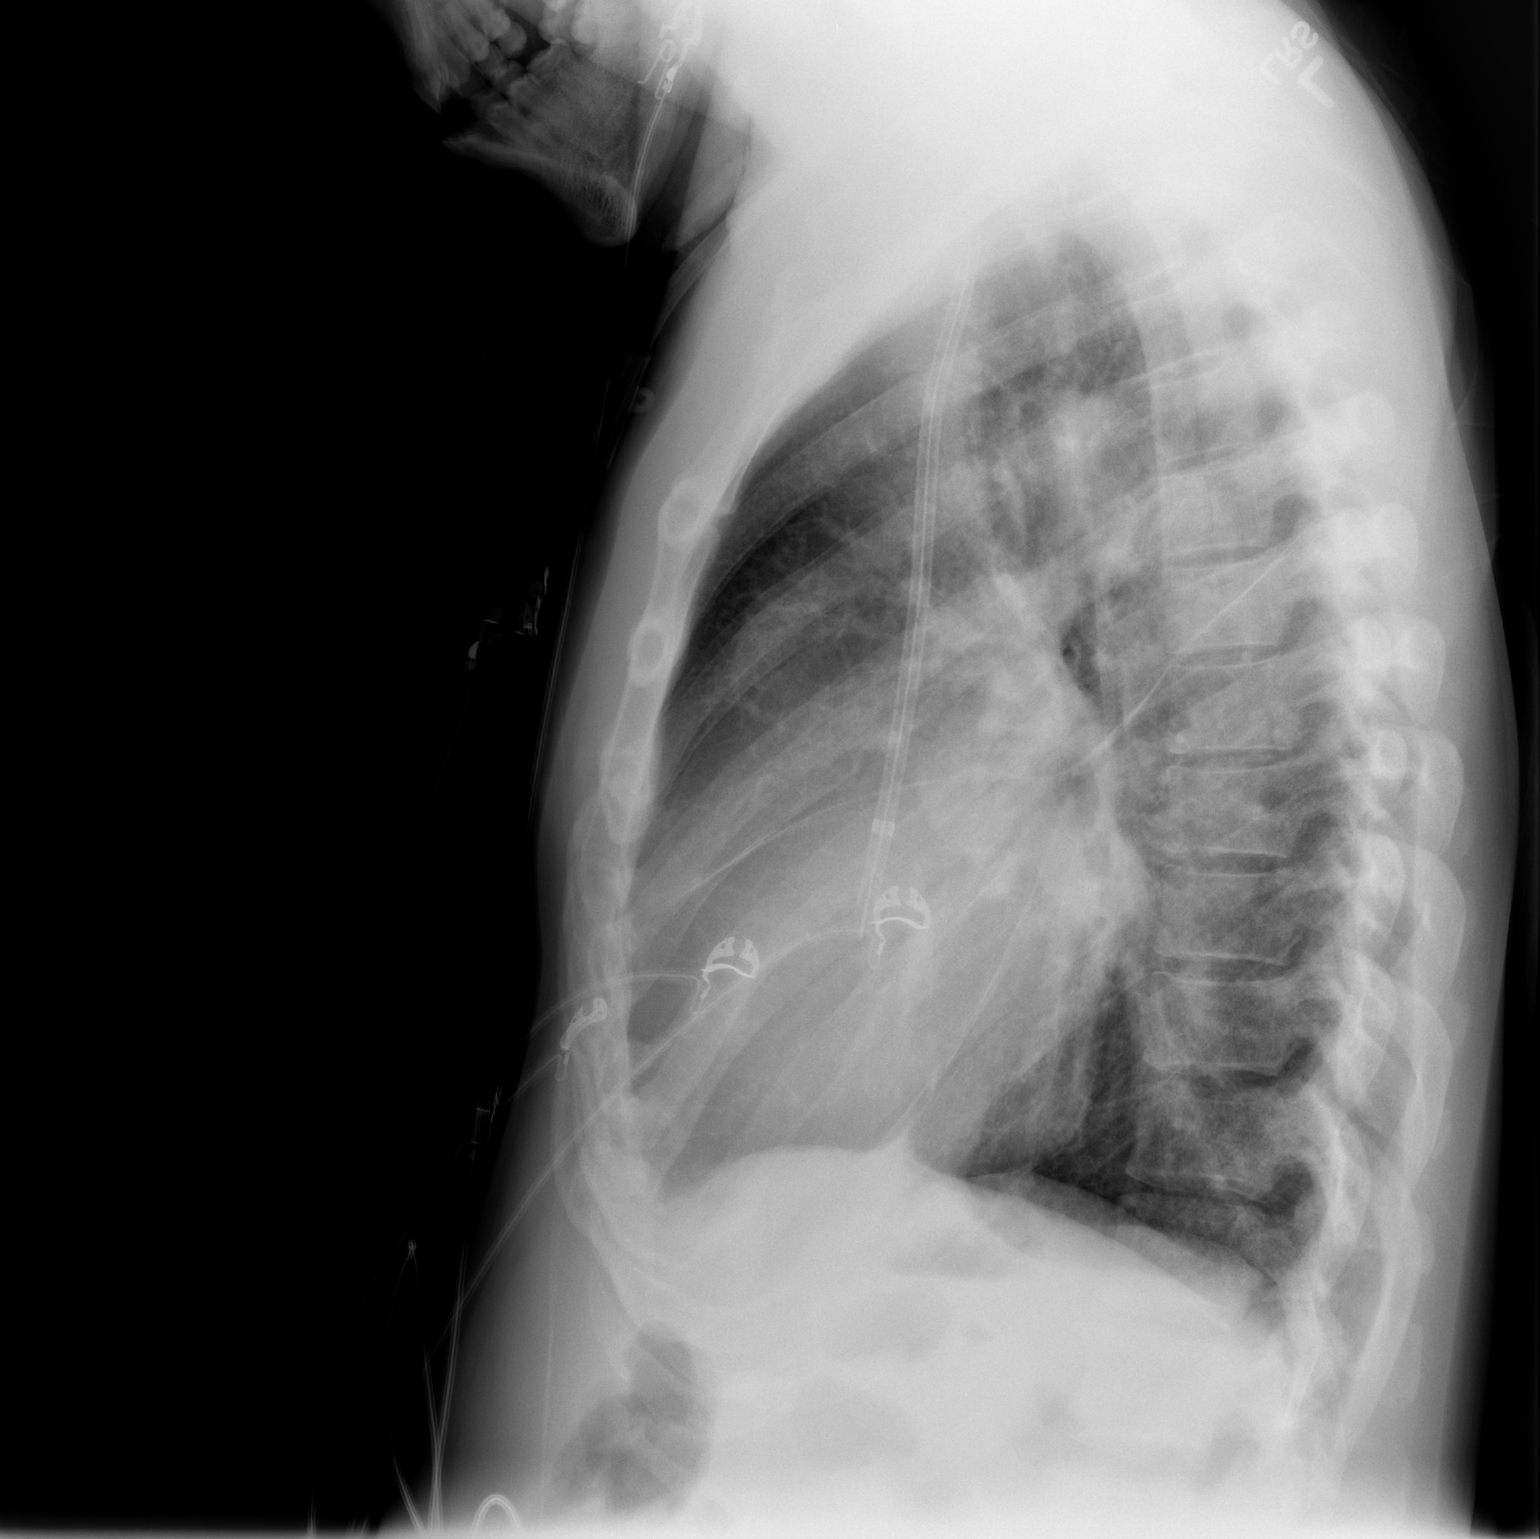

[2 of 2 positions shown; findings below may reference images not displayed]

FINDINGS: Right IJ dialysis catheter is in stable position, tip at the upper
right atrial level. There is chronic cardiomegaly. Unremarkable
upper mediastinal contours.

Chronic pulmonary venous congestion. No edema, consolidation,
effusion, or pneumothorax.
IMPRESSION: Cardiomegaly and chronic venous congestion.  No edema or pneumonia.

## 2016-06-09 MED ORDER — OXYCODONE-ACETAMINOPHEN 5-325 MG PO TABS: 1.0000 | ORAL_TABLET | Freq: Once | ORAL | Status: DC

## 2016-06-09 MED ORDER — SODIUM POLYSTYRENE SULFONATE 15 GM/60ML PO SUSP
30.0000 g | Freq: Once | ORAL | Status: AC
  Administered 2016-06-09: 30 g via ORAL
  Filled 2016-06-09: qty 120

## 2016-06-09 MED ORDER — KETOROLAC TROMETHAMINE 15 MG/ML IJ SOLN
15.0000 mg | Freq: Once | INTRAMUSCULAR | Status: AC
  Administered 2016-06-09: 15 mg via INTRAMUSCULAR
  Filled 2016-06-09: qty 1

## 2016-06-09 NOTE — ED Provider Notes (Signed)
[facility] DEPT Provider Note   CSN: [id number] Arrival date & time: 06/09/16  2241  By signing my name below, I, Hansel Feinstein, attest that this documentation has been prepared under the direction and in the presence of Merryl Hacker, MD. Electronically Signed: Hansel Feinstein, ED Scribe. 06/09/16. 11:19 PM.    History   Chief Complaint Chief Complaint  Patient presents with  . Chest Pain    HPI Frank [name] is a 50 y.o. male with h/o CHF, hypokalemia, ESRD on HD (T, Th, Sa), HTN, DM2 who presents to the Emergency Department complaining of moderate, constant, sharp, 8/10 substernal CP for 2 days. Pt reports associated SOB. Pt denies recent injury, trauma to the chest or heavy lifting. No worsening or alleviating factors noted. He states his pain is unaffected by eating or breathing. He states no relief of pain with ibuprofen or muscle relaxers. Pt reports h/o similar pain, but not as long lasting. No h/o cardiac stents. Pt was last dialyzed yesterday. He denies cough, fever, leg swelling.   She is well known to the ER. Recent multiple ED visits for chest pain. Also does not have a dialysis home and receives dialysis on the inpatient unit. He had a partial dialysis session 2 days ago but it appears he had a full dialysis session yesterday.  The history is provided by the patient. No language interpreter was used.    Past Medical History:  Diagnosis Date  . Anemia   . Anxiety   . Chronic combined systolic and diastolic CHF (congestive heart failure) (HCC)    a. EF 20-25% by echo in 08/2015 b. echo 10/2015: EF 35-40%, diffuse HK, severe LAE, moderate RAE, small pericardial effusion  . Complication of anesthesia    itching, sore throat  . Depression   . Dialysis patient ([place])   . ESRD (end stage renal disease) ([place])    due to HTN per patient, followed at [facility], s/p failed kidney transplant - dialysis Tue, Th, Sat  . Hyperkalemia 12/2015  . Hypertension   . Junctional rhythm    a. noted in 08/2015: hyperkalemic at that time  b. 12/2015: presented in junctional rhythm w/ K+ of 6.6. Resolved with improvement of K+ levels.  . Nonischemic cardiomyopathy ([place])    a. 08/2014: cath showing minimal CAD, but tortuous arteries noted.   . Personal history of DVT (deep vein thrombosis)/ PE 05/26/2016   In Oct 2015 had small subsemental LUL PE w/o DVT (LE dopplers neg) and was felt to be HD cath related, treated w coumadin.  IN May 2016 had small vein DVT (acute/subacute) in the R basilic/ brachial veins of the RUE, resumed on coumadin.  Had R sided HD cath at that time.    . Renal insufficiency   . Shortness of breath   . Type II diabetes mellitus (HCC)    No history per patient, but remains under history as A1c would not be accurate given on dialysis    Patient Active Problem List   Diagnosis Date Noted  . Uremia 06/07/2016  . Hyperkalemia 05/29/2016  . Chronic back pain 05/29/2016  . GERD (gastroesophageal reflux disease) 05/29/2016  . Personal history of DVT (deep vein thrombosis)/ PE 05/26/2016  . Bradycardia 03/31/2016  . Nonischemic cardiomyopathy ([place]) 01/09/2016  . Bilateral low back pain without sciatica   . Left renal mass 10/30/2015  . Constipation 10/30/2015  . Adjustment disorder with mixed anxiety and depressed mood 08/20/2015  . Chronic epididymitis 06/19/2015  . Groin  pain, chronic, right 06/19/2015  . Incarcerated right inguinal hernia 02/16/2015  . Essential hypertension 01/02/2015  . Dyslipidemia   . ESRD on hemodialysis ([place])   . Anemia of chronic kidney failure 06/24/2013    Past Surgical History:  Procedure Laterality Date  . CAPD INSERTION    . CAPD REMOVAL    . INGUINAL HERNIA REPAIR Right 02/14/2015   Procedure: REPAIR INCARCERATED RIGHT INGUINAL HERNIA;  Surgeon: [name], MD;  Location: [facility];  Service: General;  Laterality: Right;  . INSERTION OF DIALYSIS CATHETER Right 09/23/2015   Procedure: exchange of Right internal Dialysis  Catheter.;  Surgeon: [name], MD;  Location: [facility];  Service: Vascular;  Laterality: Right;  . KIDNEY RECEIPIENT  2006   failed and started HD in March 2014  . LEFT HEART CATHETERIZATION WITH CORONARY ANGIOGRAM N/A 09/02/2014   Procedure: LEFT HEART CATHETERIZATION WITH CORONARY ANGIOGRAM;  Surgeon: [name], MD;  Location: [facility] CATH LAB;  Service: Cardiovascular;  Laterality: N/A;       Home Medications    Prior to Admission medications   Medication Sig Start Date End Date Taking? Authorizing Provider  allopurinol (ZYLOPRIM) 100 MG tablet Take 100 mg by mouth daily.    Historical Provider, MD  amLODipine (NORVASC) 10 MG tablet Take 10 mg by mouth daily.    Historical Provider, MD  atorvastatin (LIPITOR) 40 MG tablet Take 1 tablet (40 mg total) by mouth daily at 6 PM. 09/02/14   Barton Dubois, MD  carvedilol (COREG) 25 MG tablet Take 25 mg by mouth 2 (two) times daily with a meal.    Historical Provider, MD  cinacalcet (SENSIPAR) 30 MG tablet Take 30 mg by mouth daily.    Historical Provider, MD  cloNIDine (CATAPRES - DOSED IN MG/24 HR) 0.3 mg/24hr patch Place 0.3 mg onto the skin once a week.    Historical Provider, MD  doxazosin (CARDURA) 2 MG tablet Take 2 mg by mouth daily.    Historical Provider, MD  famotidine (PEPCID) 20 MG tablet Take 1 tablet (20 mg total) by mouth 2 (two) times daily. 02/13/16   Charlesetta Shanks, MD  gabapentin (NEURONTIN) 100 MG capsule Take 1 capsule (100 mg total) by mouth at bedtime. Patient taking differently: Take 100 mg by mouth 3 (three) times daily.  05/21/16   [name], DO  hydrALAZINE (APRESOLINE) 100 MG tablet Take 100 mg by mouth 2 (two) times daily.     Historical Provider, MD  isosorbide mononitrate (IMDUR) 60 MG 24 hr tablet Take 1 tablet (60 mg total) by mouth daily. 01/09/16   Smiley Houseman, MD  omeprazole (PRILOSEC) 20 MG capsule Take 20 mg by mouth daily. 07/01/13   Historical Provider, MD  ondansetron (ZOFRAN ODT) 4 MG  disintegrating tablet Take 1 tablet (4 mg total) by mouth every 8 (eight) hours as needed for nausea or vomiting. 05/16/16   Sherwood Gambler, MD  oxycodone (OXY-IR) 5 MG capsule Take 1 capsule (5 mg total) by mouth every 4 (four) hours as needed for pain. 05/30/16   [name], MD  predniSONE (DELTASONE) 10 MG tablet Take 10 mg by mouth daily with breakfast.    Historical Provider, MD  sevelamer carbonate (RENVELA) 800 MG tablet Take 800-1,600 mg by mouth See admin instructions. Take 1600 mg by mouth 3 times daily with meals and take 800 mg by mouth with snacks.    Historical Provider, MD  warfarin (COUMADIN) 2 MG tablet Take 2 mg by  mouth daily. Take with 79m tablet to equal 764m7/12/17   Historical Provider, MD  warfarin (COUMADIN) 5 MG tablet Take 5 mg by mouth daily. Take with 80m780mablet to equal 7mg60m12/17   Historical Provider, MD    Family History Family History  Problem Relation Age of Onset  . Hypertension Other     Social History Social History  Substance Use Topics  . Smoking status: Former Smoker    Packs/day: 0.00    Years: 1.00    Types: Cigarettes  . Smokeless tobacco: Never Used     Comment: quit Jan 2014  . Alcohol use No     Allergies   Butalbital-apap-caffeine; Ferrlecit [na ferric gluc cplx in sucrose]; Minoxidil; Darvocet [propoxyphene n-acetaminophen]; and Other   Review of Systems Review of Systems  Constitutional: Negative for fever.  Respiratory: Positive for shortness of breath. Negative for cough.   Cardiovascular: Positive for chest pain. Negative for leg swelling.  Gastrointestinal: Negative for abdominal pain, nausea and vomiting.  All other systems reviewed and are negative.    Physical Exam Updated Vital Signs BP (!) 170/108   Pulse 64   Temp 98.5 F (36.9 C)   Resp 16   SpO2 98%   Physical Exam  Constitutional: He is oriented to person, place, and time. No distress.  Chronically ill-appearing, no acute distress  HENT:  Head:  Normocephalic and atraumatic.  Cardiovascular: Normal rate, regular rhythm and normal heart sounds.   No murmur heard. Pulmonary/Chest: Effort normal and breath sounds normal. No respiratory distress. He has no wheezes. He exhibits tenderness.  Abdominal: Soft. There is no tenderness.  Musculoskeletal: He exhibits no edema.  Neurological: He is alert and oriented to person, place, and time.  Skin: Skin is warm and dry.  Psychiatric: He has a normal mood and affect.  Nursing note and vitals reviewed.    ED Treatments / Results   DIAGNOSTIC STUDIES: Oxygen Saturation is 100% on RA, normal by my interpretation.    COORDINATION OF CARE: 11:09 PM Discussed treatment plan with pt at bedside which includes CXR, lab work and pt agreed to plan.    Labs (all labs ordered are listed, but only abnormal results are displayed) Labs Reviewed  BASIC METABOLIC PANEL - Abnormal; Notable for the following:       Result Value   Potassium 5.4 (*)    Chloride 99 (*)    Glucose, Bld 129 (*)    BUN 31 (*)    Creatinine, Ser 10.17 (*)    Calcium 8.4 (*)    GFR calc non Af Amer 5 (*)    GFR calc Af Amer 6 (*)    All other components within normal limits  CBC - Abnormal; Notable for the following:    RBC 3.52 (*)    Hemoglobin 10.0 (*)    HCT 31.9 (*)    RDW 17.2 (*)    All other components within normal limits  I-STAT TROPOININ, ED    EKG  EKG Interpretation  Date/Time:  Sunday June 09 2016 22:50:42 EST Ventricular Rate:  64 PR Interval:  180 QRS Duration: 90 QT Interval:  462 QTC Calculation: 476 R Axis:   47 Text Interpretation:  Normal sinus rhythm Left ventricular hypertrophy with repolarization abnormality Abnormal ECG ST changes inferiorly and laterally more prominent when compared to others Confirmed by HORTDina Rich, [id number] 06/09/2016 11:06:09 PM       Radiology Dg Chest 2 View  Result Date:  06/09/2016 CLINICAL DATA:  Acute onset of generalized chest pain  and shortness of breath. Initial encounter. EXAM: CHEST  2 VIEW COMPARISON:  Chest radiograph performed 06/07/2016 FINDINGS: The lungs are well-aerated and clear. There is no evidence of focal opacification, pleural effusion or pneumothorax. The heart is mildly enlarged. No acute osseous abnormalities are seen. A right-sided dual-lumen catheter is noted ending about the distal SVC. IMPRESSION: Mild cardiomegaly.  Lungs remain grossly clear. Electronically Signed   By: Garald Balding M.D.   On: 06/09/2016 23:13    Procedures Procedures (including critical care time)  Medications Ordered in ED Medications  ketorolac (TORADOL) 15 MG/ML injection 15 mg (15 mg Intramuscular Given 06/09/16 2333)  sodium polystyrene (KAYEXALATE) 15 GM/60ML suspension 30 g (30 g Oral Given 06/09/16 2338)     Initial Impression / Assessment and Plan / ED Course  I have reviewed the triage vital signs and the nursing notes.  Pertinent labs & imaging results that were available during my care of the patient were reviewed by me and considered in my medical decision making (see chart for details).  Clinical Course     Patient presents with 2 days of ongoing chest pain. History of the same. Recent history of costochondritis and frequent visits to the emergency room for chest pain and needing dialysis. Chest pain continues to be reproducible on exam. He was given Toradol. He has inferior and lateral T-wave changes which are consistent with prior EKGs. Doubt ACS. Troponin is negative. Given duration of symptoms, do not feel he needs a second troponin. Was noted to have a potassium of 5.4. No hyperkalemic changes on EKG. He frequently is greater than 6. Based on chart review, he had a full dialysis session yesterday and a partial dialysis session on Friday. He was given 30 mg of Kayexalate. I discussed with the patient that he will likely need dialysis in the next 24 hours given his potassium levels. Do not feel he needs  emergent dialysis tonight and feel he is safe for discharge home. I have encouraged him to return tomorrow for evaluation for dialysis tomorrow.  After history, exam, and medical workup I feel the patient has been appropriately medically screened and is safe for discharge home. Pertinent diagnoses were discussed with the patient. Patient was given return precautions.   Final Clinical Impressions(s) / ED Diagnoses   Final diagnoses:  Atypical chest pain  Chronic hyperkalemia    New Prescriptions New Prescriptions   No medications on file    I personally performed the services described in this documentation, which was scribed in my presence. The recorded information has been reviewed and is accurate.    Merryl Hacker, MD 06/10/16 [phone]

## 2016-06-09 NOTE — ED Triage Notes (Signed)
Onset 2 days constant substernal chest pain and nausea.

## 2016-06-09 NOTE — ED Notes (Signed)
Patient transported to X-ray 

## 2016-06-09 NOTE — ED Notes (Signed)
Pt A & O, medicated per MD order.

## 2016-06-10 ENCOUNTER — Encounter (HOSPITAL_COMMUNITY): Payer: Self-pay | Admitting: Emergency Medicine

## 2016-06-10 ENCOUNTER — Non-Acute Institutional Stay (HOSPITAL_COMMUNITY)
Admission: EM | Admit: 2016-06-10 | Discharge: 2016-06-10 | Disposition: A | Discharge status: Home / Self Care | Payer: Medicare Other | Source: Home / Self Care | Attending: Emergency Medicine | Admitting: Emergency Medicine

## 2016-06-10 DIAGNOSIS — Z79899 Other long term (current) drug therapy: Secondary | ICD-10-CM | POA: Insufficient documentation

## 2016-06-10 DIAGNOSIS — Z79891 Long term (current) use of opiate analgesic: Secondary | ICD-10-CM

## 2016-06-10 DIAGNOSIS — Z87891 Personal history of nicotine dependence: Secondary | ICD-10-CM

## 2016-06-10 DIAGNOSIS — E785 Hyperlipidemia, unspecified: Secondary | ICD-10-CM | POA: Insufficient documentation

## 2016-06-10 DIAGNOSIS — Z86711 Personal history of pulmonary embolism: Secondary | ICD-10-CM | POA: Insufficient documentation

## 2016-06-10 DIAGNOSIS — N19 Unspecified kidney failure: Secondary | ICD-10-CM | POA: Diagnosis present

## 2016-06-10 DIAGNOSIS — I132 Hypertensive heart and chronic kidney disease with heart failure and with stage 5 chronic kidney disease, or end stage renal disease: Secondary | ICD-10-CM

## 2016-06-10 DIAGNOSIS — I429 Cardiomyopathy, unspecified: Secondary | ICD-10-CM

## 2016-06-10 DIAGNOSIS — Z7901 Long term (current) use of anticoagulants: Secondary | ICD-10-CM

## 2016-06-10 DIAGNOSIS — Z992 Dependence on renal dialysis: Secondary | ICD-10-CM

## 2016-06-10 DIAGNOSIS — I120 Hypertensive chronic kidney disease with stage 5 chronic kidney disease or end stage renal disease: Secondary | ICD-10-CM | POA: Diagnosis not present

## 2016-06-10 DIAGNOSIS — M549 Dorsalgia, unspecified: Secondary | ICD-10-CM | POA: Insufficient documentation

## 2016-06-10 DIAGNOSIS — G8929 Other chronic pain: Secondary | ICD-10-CM | POA: Insufficient documentation

## 2016-06-10 DIAGNOSIS — I5042 Chronic combined systolic (congestive) and diastolic (congestive) heart failure: Secondary | ICD-10-CM | POA: Insufficient documentation

## 2016-06-10 DIAGNOSIS — Z86718 Personal history of other venous thrombosis and embolism: Secondary | ICD-10-CM | POA: Insufficient documentation

## 2016-06-10 DIAGNOSIS — E1122 Type 2 diabetes mellitus with diabetic chronic kidney disease: Secondary | ICD-10-CM

## 2016-06-10 DIAGNOSIS — N186 End stage renal disease: Secondary | ICD-10-CM

## 2016-06-10 DIAGNOSIS — R079 Chest pain, unspecified: Secondary | ICD-10-CM | POA: Diagnosis not present

## 2016-06-10 NOTE — ED Notes (Signed)
In room to d/c patient. Pt very upset he is being d/c, pt states the process to have HD is to come through the ED and be seen first. Pt states this is how he is told by MD's this is how the process must be done. Pt states he has been coming here x 2 months doing this routine for HD. Pt is upset he is being told he is being discharged and will need to wait in [place] and check back in for HD in the AM.  Pt continues to state his Medicaid has already pain for this visit so he needs to stay in a bed and wait for HD like he has every other time he has come to ED.

## 2016-06-10 NOTE — ED Notes (Signed)
Pt. Given meal and transported via wheelchair to dialysis

## 2016-06-10 NOTE — ED Notes (Signed)
Dr. Dina Rich and D.Hinson,RN (charge) at bedside

## 2016-06-10 NOTE — ED Notes (Signed)
Pt ambulated to the bathroom without difficulty, refused to be placed back on monitor.

## 2016-06-10 NOTE — ED Provider Notes (Addendum)
[facility] DEPT Provider Note   CSN: [id number] Arrival date & time: 06/10/16  0503     History   Chief Complaint Chief Complaint  Patient presents with  . Vascular Access Problem    HPI Frank [name] is a 50 y.o. male.  HPI  This is a 50 year old male with history of end-stage renal disease who presents requesting dialysis. He was seen and evaluated last night for chest pain. At that time he was noted to have a mildly elevated potassium at 5.4. Otherwise his workup was reassuring. He was given Kayexalate and instructed to return for dialysis today per schedule as an inpatient. He reports persistent chest pain. He currently has no other complaints.  Past Medical History:  Diagnosis Date  . Anemia   . Anxiety   . Chronic combined systolic and diastolic CHF (congestive heart failure) (HCC)    a. EF 20-25% by echo in 08/2015 b. echo 10/2015: EF 35-40%, diffuse HK, severe LAE, moderate RAE, small pericardial effusion  . Complication of anesthesia    itching, sore throat  . Depression   . Dialysis patient ([place])   . ESRD (end stage renal disease) ([place])    due to HTN per patient, followed at [facility], s/p failed kidney transplant - dialysis Tue, Th, Sat  . Hyperkalemia 12/2015  . Hypertension   . Junctional rhythm    a. noted in 08/2015: hyperkalemic at that time  b. 12/2015: presented in junctional rhythm w/ K+ of 6.6. Resolved with improvement of K+ levels.  . Nonischemic cardiomyopathy ([place])    a. 08/2014: cath showing minimal CAD, but tortuous arteries noted.   . Personal history of DVT (deep vein thrombosis)/ PE 05/26/2016   In Oct 2015 had small subsemental LUL PE w/o DVT (LE dopplers neg) and was felt to be HD cath related, treated w coumadin.  IN May 2016 had small vein DVT (acute/subacute) in the R basilic/ brachial veins of the RUE, resumed on coumadin.  Had R sided HD cath at that time.    . Renal insufficiency   . Shortness of breath   . Type II diabetes mellitus  (HCC)    No history per patient, but remains under history as A1c would not be accurate given on dialysis    Patient Active Problem List   Diagnosis Date Noted  . Uremia 06/07/2016  . Hyperkalemia 05/29/2016  . Chronic back pain 05/29/2016  . GERD (gastroesophageal reflux disease) 05/29/2016  . Personal history of DVT (deep vein thrombosis)/ PE 05/26/2016  . Bradycardia 03/31/2016  . Nonischemic cardiomyopathy ([place]) 01/09/2016  . Bilateral low back pain without sciatica   . Left renal mass 10/30/2015  . Constipation 10/30/2015  . Adjustment disorder with mixed anxiety and depressed mood 08/20/2015  . Chronic epididymitis 06/19/2015  . Groin pain, chronic, right 06/19/2015  . Incarcerated right inguinal hernia 02/16/2015  . Essential hypertension 01/02/2015  . Dyslipidemia   . ESRD on hemodialysis ([place])   . Anemia of chronic kidney failure 06/24/2013    Past Surgical History:  Procedure Laterality Date  . CAPD INSERTION    . CAPD REMOVAL    . INGUINAL HERNIA REPAIR Right 02/14/2015   Procedure: REPAIR INCARCERATED RIGHT INGUINAL HERNIA;  Surgeon: [name], MD;  Location: [facility];  Service: General;  Laterality: Right;  . INSERTION OF DIALYSIS CATHETER Right 09/23/2015   Procedure: exchange of Right internal Dialysis Catheter.;  Surgeon: [name], MD;  Location: [place];  Service: Vascular;  Laterality: Right;  .  KIDNEY RECEIPIENT  2006   failed and started HD in March 2014  . LEFT HEART CATHETERIZATION WITH CORONARY ANGIOGRAM N/A 09/02/2014   Procedure: LEFT HEART CATHETERIZATION WITH CORONARY ANGIOGRAM;  Surgeon: [name], MD;  Location: [facility] CATH LAB;  Service: Cardiovascular;  Laterality: N/A;       Home Medications    Prior to Admission medications   Medication Sig Start Date End Date Taking? Authorizing Provider  allopurinol (ZYLOPRIM) 100 MG tablet Take 100 mg by mouth daily.   Yes Historical Provider, MD  amLODipine (NORVASC) 10 MG tablet Take 10 mg by  mouth daily.   Yes Historical Provider, MD  atorvastatin (LIPITOR) 40 MG tablet Take 1 tablet (40 mg total) by mouth daily at 6 PM. 09/02/14  Yes Barton Dubois, MD  carvedilol (COREG) 25 MG tablet Take 25 mg by mouth 2 (two) times daily with a meal.   Yes Historical Provider, MD  cinacalcet (SENSIPAR) 30 MG tablet Take 30 mg by mouth daily.   Yes Historical Provider, MD  cloNIDine (CATAPRES - DOSED IN MG/24 HR) 0.3 mg/24hr patch Place 0.3 mg onto the skin once a week.   Yes Historical Provider, MD  doxazosin (CARDURA) 2 MG tablet Take 2 mg by mouth daily.   Yes Historical Provider, MD  famotidine (PEPCID) 20 MG tablet Take 1 tablet (20 mg total) by mouth 2 (two) times daily. 02/13/16  Yes Charlesetta Shanks, MD  gabapentin (NEURONTIN) 100 MG capsule Take 1 capsule (100 mg total) by mouth at bedtime. Patient taking differently: Take 100 mg by mouth 3 (three) times daily.  05/21/16  Yes [name], DO  hydrALAZINE (APRESOLINE) 100 MG tablet Take 100 mg by mouth 2 (two) times daily.    Yes Historical Provider, MD  isosorbide mononitrate (IMDUR) 60 MG 24 hr tablet Take 1 tablet (60 mg total) by mouth daily. 01/09/16  Yes Smiley Houseman, MD  omeprazole (PRILOSEC) 20 MG capsule Take 20 mg by mouth daily. 07/01/13  Yes Historical Provider, MD  ondansetron (ZOFRAN ODT) 4 MG disintegrating tablet Take 1 tablet (4 mg total) by mouth every 8 (eight) hours as needed for nausea or vomiting. 05/16/16  Yes Sherwood Gambler, MD  oxycodone (OXY-IR) 5 MG capsule Take 1 capsule (5 mg total) by mouth every 4 (four) hours as needed for pain. 05/30/16  Yes [name], MD  predniSONE (DELTASONE) 10 MG tablet Take 10 mg by mouth daily with breakfast.   Yes Historical Provider, MD  sevelamer carbonate (RENVELA) 800 MG tablet Take 800-1,600 mg by mouth See admin instructions. Take 1600 mg by mouth 3 times daily with meals and take 800 mg by mouth with snacks.   Yes Historical Provider, MD  warfarin (COUMADIN) 2 MG tablet  Take 2 mg by mouth daily. Take with 74m tablet to equal 770m7/12/17  Yes Historical Provider, MD  warfarin (COUMADIN) 5 MG tablet Take 5 mg by mouth daily. Take with 71m104mablet to equal 7mg64m12/17  Yes Historical Provider, MD    Family History Family History  Problem Relation Age of Onset  . Hypertension Other     Social History Social History  Substance Use Topics  . Smoking status: Former Smoker    Packs/day: 0.00    Years: 1.00    Types: Cigarettes  . Smokeless tobacco: Never Used     Comment: quit Jan 2014  . Alcohol use No     Allergies   Butalbital-apap-caffeine; Ferrlecit [na ferric gluc cplx  in sucrose]; Minoxidil; Darvocet [propoxyphene n-acetaminophen]; and Other   Review of Systems Review of Systems  Respiratory: Negative for shortness of breath.   Cardiovascular: Positive for chest pain.  Gastrointestinal: Negative for abdominal pain, nausea and vomiting.  All other systems reviewed and are negative.    Physical Exam Updated Vital Signs BP 170/96 (BP Location: Right Arm)   Pulse 63   Temp 98.1 F (36.7 C) (Oral)   Resp 18   SpO2 99%   Physical Exam  Constitutional: He is oriented to person, place, and time. He appears well-developed and well-nourished.  Resting comfortably, chronically ill-appearing  HENT:  Head: Normocephalic and atraumatic.  Cardiovascular: Normal rate and regular rhythm.   Pulmonary/Chest: Effort normal. No respiratory distress.  Neurological: He is alert and oriented to person, place, and time.  Skin: Skin is warm and dry.  Psychiatric: He has a normal mood and affect.  Nursing note and vitals reviewed.    ED Treatments / Results  Labs (all labs ordered are listed, but only abnormal results are displayed) Labs Reviewed - No data to display  EKG  EKG Interpretation None       Radiology Dg Chest 2 View  Result Date: 06/09/2016 CLINICAL DATA:  Acute onset of generalized chest pain and shortness of breath.  Initial encounter. EXAM: CHEST  2 VIEW COMPARISON:  Chest radiograph performed 06/07/2016 FINDINGS: The lungs are well-aerated and clear. There is no evidence of focal opacification, pleural effusion or pneumothorax. The heart is mildly enlarged. No acute osseous abnormalities are seen. A right-sided dual-lumen catheter is noted ending about the distal SVC. IMPRESSION: Mild cardiomegaly.  Lungs remain grossly clear. Electronically Signed   By: Garald Balding M.D.   On: 06/09/2016 23:13    Procedures Procedures (including critical care time)  Medications Ordered in ED Medications - No data to display   Initial Impression / Assessment and Plan / ED Course  I have reviewed the triage vital signs and the nursing notes.  Pertinent labs & imaging results that were available during my care of the patient were reviewed by me and considered in my medical decision making (see chart for details).  Clinical Course     Patient presents for his dialysis session. He normally dialyzes Monday, Wednesday, Friday. Last dialysis was Saturday. He was seen and evaluated last night for chest pain which persists but his evaluation last night was reassuring. He has had ongoing reproducible chest pain. Will not pursue any further workup. Nephrology consulted for dialysis.  5:55 AM Discussed with Dr. Jonnie Finner, nephrology. Plan for dialysis today.  No further evaluation or testing this morning.  Final Clinical Impressions(s) / ED Diagnoses   Final diagnoses:  ESRD (end stage renal disease) ([place])    New Prescriptions New Prescriptions   No medications on file     Merryl Hacker, MD 06/10/16 [id number]    Merryl Hacker, MD 06/10/16 [phone]

## 2016-06-10 NOTE — Progress Notes (Signed)
tx completed without complications; pt denies SOB, dizziness, pain, weakness, or cramping. Pt ambulated off the unit to go home.

## 2016-06-10 NOTE — ED Triage Notes (Signed)
Pt just discharged from ED.  Sts he doesn't need triage or blood work, just dialysis.

## 2016-06-10 NOTE — Discharge Instructions (Signed)
You were seen today for chest pain. You have a history of the same. Your workup is reassuring. Your potassium however, was 5.4. Your potassium is elevated frequently greater than 6. There is no indication at this time for dialysis overnight emergently however, you should return tomorrow morning as you will likely need dialysis in the next 24 hours.

## 2016-06-10 NOTE — Progress Notes (Signed)
Called to have patient dialyzed. HD orders written.  Pt is w/o an outpt HD unit.   Anemia - has rec'd total 450 ug darbepoetin in the last 30 days.  Hb 10 today  Fe - tsat was 22% this week when tested.  He refused IV Feraheme when it was ordered last week due to iron allergy, but did receive it in August successfully.  He has an iron allergy listed but per the chart this allergy is to ferric gluconate.    Kelly Splinter MD [employer] pgr [phone]   06/10/2016, 7:05 AM

## 2016-06-14 ENCOUNTER — Non-Acute Institutional Stay (HOSPITAL_COMMUNITY)
Admission: EM | Admit: 2016-06-14 | Discharge: 2016-06-14 | Disposition: A | Discharge status: Home / Self Care | Payer: Medicare Other | Source: Home / Self Care | Attending: Emergency Medicine | Admitting: Emergency Medicine

## 2016-06-14 ENCOUNTER — Encounter (HOSPITAL_COMMUNITY): Payer: Self-pay

## 2016-06-14 DIAGNOSIS — Z7901 Long term (current) use of anticoagulants: Secondary | ICD-10-CM | POA: Insufficient documentation

## 2016-06-14 DIAGNOSIS — Z888 Allergy status to other drugs, medicaments and biological substances status: Secondary | ICD-10-CM | POA: Diagnosis not present

## 2016-06-14 DIAGNOSIS — Z9115 Patient's noncompliance with renal dialysis: Secondary | ICD-10-CM | POA: Diagnosis not present

## 2016-06-14 DIAGNOSIS — N189 Chronic kidney disease, unspecified: Secondary | ICD-10-CM

## 2016-06-14 DIAGNOSIS — R0789 Other chest pain: Secondary | ICD-10-CM | POA: Diagnosis not present

## 2016-06-14 DIAGNOSIS — N186 End stage renal disease: Secondary | ICD-10-CM

## 2016-06-14 DIAGNOSIS — Z86718 Personal history of other venous thrombosis and embolism: Secondary | ICD-10-CM | POA: Insufficient documentation

## 2016-06-14 DIAGNOSIS — Z79899 Other long term (current) drug therapy: Secondary | ICD-10-CM

## 2016-06-14 DIAGNOSIS — I5042 Chronic combined systolic (congestive) and diastolic (congestive) heart failure: Secondary | ICD-10-CM

## 2016-06-14 DIAGNOSIS — T8612 Kidney transplant failure: Secondary | ICD-10-CM

## 2016-06-14 DIAGNOSIS — I120 Hypertensive chronic kidney disease with stage 5 chronic kidney disease or end stage renal disease: Secondary | ICD-10-CM | POA: Diagnosis not present

## 2016-06-14 DIAGNOSIS — R079 Chest pain, unspecified: Secondary | ICD-10-CM | POA: Diagnosis not present

## 2016-06-14 DIAGNOSIS — E785 Hyperlipidemia, unspecified: Secondary | ICD-10-CM | POA: Insufficient documentation

## 2016-06-14 DIAGNOSIS — I429 Cardiomyopathy, unspecified: Secondary | ICD-10-CM | POA: Diagnosis not present

## 2016-06-14 DIAGNOSIS — Z992 Dependence on renal dialysis: Secondary | ICD-10-CM | POA: Insufficient documentation

## 2016-06-14 DIAGNOSIS — Z7952 Long term (current) use of systemic steroids: Secondary | ICD-10-CM | POA: Diagnosis not present

## 2016-06-14 DIAGNOSIS — Z940 Kidney transplant status: Secondary | ICD-10-CM | POA: Diagnosis not present

## 2016-06-14 DIAGNOSIS — I132 Hypertensive heart and chronic kidney disease with heart failure and with stage 5 chronic kidney disease, or end stage renal disease: Secondary | ICD-10-CM | POA: Insufficient documentation

## 2016-06-14 DIAGNOSIS — R110 Nausea: Secondary | ICD-10-CM | POA: Diagnosis not present

## 2016-06-14 DIAGNOSIS — R9431 Abnormal electrocardiogram [ECG] [EKG]: Secondary | ICD-10-CM | POA: Diagnosis not present

## 2016-06-14 DIAGNOSIS — K219 Gastro-esophageal reflux disease without esophagitis: Secondary | ICD-10-CM

## 2016-06-14 DIAGNOSIS — Z87891 Personal history of nicotine dependence: Secondary | ICD-10-CM | POA: Insufficient documentation

## 2016-06-14 DIAGNOSIS — E1122 Type 2 diabetes mellitus with diabetic chronic kidney disease: Secondary | ICD-10-CM

## 2016-06-14 DIAGNOSIS — E875 Hyperkalemia: Secondary | ICD-10-CM | POA: Diagnosis not present

## 2016-06-14 DIAGNOSIS — I428 Other cardiomyopathies: Secondary | ICD-10-CM

## 2016-06-14 DIAGNOSIS — R112 Nausea with vomiting, unspecified: Secondary | ICD-10-CM | POA: Diagnosis not present

## 2016-06-14 MED ORDER — AMLODIPINE BESYLATE 5 MG PO TABS
10.0000 mg | ORAL_TABLET | Freq: Every day | ORAL | Status: DC
  Administered 2016-06-14: 10 mg via ORAL
  Filled 2016-06-14: qty 2

## 2016-06-14 NOTE — Progress Notes (Signed)
Medicare pt with [facility] ED visits x 17 for hyperkalemia, ESRD,  Near syncope, back pain and intractable vomiting/nausea and admissions x 8 No ED CMP sent to [facility] referral availability Last admission 05/30/16 to 05/31/16 for hyperkalemia

## 2016-06-14 NOTE — ED Notes (Signed)
Lunch tray ordered 

## 2016-06-14 NOTE — ED Notes (Signed)
Breakfast tray delivered

## 2016-06-14 NOTE — ED Notes (Signed)
Patient here stating he is due for dialysis today. Patient in no distress, no complaints. Alert and oriented, last treatment on Monday

## 2016-06-14 NOTE — ED Provider Notes (Addendum)
[facility] DEPT Provider Note   CSN: [id number] Arrival date & time: 06/14/16  0546   History   Frank Complaint Requests dialysis  HPI Frank [name] is a 50 y.o. male.  HPI Pt has a history of chronic renal failure on dialysis.  He no longer has an outpatient dialysis center.  He presents to the ED today requesting dialysis.  His last treatment was on 11/20.  Pt denies any chest pain or shortness of breath.  NO nausea or vomiting.  Overall he feels well, he knows that he needs to have his dialysis though.  Past Medical History:  Diagnosis Date  . Anemia   . Anxiety   . Chronic combined systolic and diastolic CHF (congestive heart failure) (HCC)    a. EF 20-25% by echo in 08/2015 b. echo 10/2015: EF 35-40%, diffuse HK, severe LAE, moderate RAE, small pericardial effusion  . Complication of anesthesia    itching, sore throat  . Depression   . Dialysis patient ([place])   . ESRD (end stage renal disease) ([place])    due to HTN per patient, followed at [facility], s/p failed kidney transplant - dialysis Tue, Th, Sat  . Hyperkalemia 12/2015  . Hypertension   . Junctional rhythm    a. noted in 08/2015: hyperkalemic at that time  b. 12/2015: presented in junctional rhythm w/ K+ of 6.6. Resolved with improvement of K+ levels.  . Nonischemic cardiomyopathy ([place])    a. 08/2014: cath showing minimal CAD, but tortuous arteries noted.   . Personal history of DVT (deep vein thrombosis)/ PE 05/26/2016   In Oct 2015 had small subsemental LUL PE w/o DVT (LE dopplers neg) and was felt to be HD cath related, treated w coumadin.  IN May 2016 had small vein DVT (acute/subacute) in the R basilic/ brachial veins of the RUE, resumed on coumadin.  Had R sided HD cath at that time.    . Renal insufficiency   . Shortness of breath   . Type II diabetes mellitus (HCC)    No history per patient, but remains under history as A1c would not be accurate given on dialysis    Patient Active Problem List   Diagnosis  Date Noted  . Uremia 06/07/2016  . Hyperkalemia 05/29/2016  . Chronic back pain 05/29/2016  . GERD (gastroesophageal reflux disease) 05/29/2016  . Personal history of DVT (deep vein thrombosis)/ PE 05/26/2016  . Bradycardia 03/31/2016  . Nonischemic cardiomyopathy ([place]) 01/09/2016  . Bilateral low back pain without sciatica   . Left renal mass 10/30/2015  . Constipation 10/30/2015  . Adjustment disorder with mixed anxiety and depressed mood 08/20/2015  . Chronic epididymitis 06/19/2015  . Groin pain, chronic, right 06/19/2015  . Incarcerated right inguinal hernia 02/16/2015  . Essential hypertension 01/02/2015  . Dyslipidemia   . ESRD on hemodialysis ([place])   . Anemia of chronic kidney failure 06/24/2013    Past Surgical History:  Procedure Laterality Date  . CAPD INSERTION    . CAPD REMOVAL    . INGUINAL HERNIA REPAIR Right 02/14/2015   Procedure: REPAIR INCARCERATED RIGHT INGUINAL HERNIA;  Surgeon: [name], MD;  Location: [facility];  Service: General;  Laterality: Right;  . INSERTION OF DIALYSIS CATHETER Right 09/23/2015   Procedure: exchange of Right internal Dialysis Catheter.;  Surgeon: [name], MD;  Location: [facility];  Service: Vascular;  Laterality: Right;  . KIDNEY RECEIPIENT  2006   failed and started HD in March 2014  . LEFT HEART CATHETERIZATION  WITH CORONARY ANGIOGRAM N/A 09/02/2014   Procedure: LEFT HEART CATHETERIZATION WITH CORONARY ANGIOGRAM;  Surgeon: [name], MD;  Location: [facility] CATH LAB;  Service: Cardiovascular;  Laterality: N/A;       Home Medications    Prior to Admission medications   Medication Sig Start Date End Date Taking? Authorizing Provider  allopurinol (ZYLOPRIM) 100 MG tablet Take 100 mg by mouth daily.    Historical Provider, MD  amLODipine (NORVASC) 10 MG tablet Take 10 mg by mouth daily.    Historical Provider, MD  atorvastatin (LIPITOR) 40 MG tablet Take 1 tablet (40 mg total) by mouth daily at 6 PM. 09/02/14   Barton Dubois, MD    carvedilol (COREG) 25 MG tablet Take 25 mg by mouth 2 (two) times daily with a meal.    Historical Provider, MD  cinacalcet (SENSIPAR) 30 MG tablet Take 30 mg by mouth daily.    Historical Provider, MD  cloNIDine (CATAPRES - DOSED IN MG/24 HR) 0.3 mg/24hr patch Place 0.3 mg onto the skin once a week.    Historical Provider, MD  doxazosin (CARDURA) 2 MG tablet Take 2 mg by mouth daily.    Historical Provider, MD  famotidine (PEPCID) 20 MG tablet Take 1 tablet (20 mg total) by mouth 2 (two) times daily. 02/13/16   Charlesetta Shanks, MD  gabapentin (NEURONTIN) 100 MG capsule Take 1 capsule (100 mg total) by mouth at bedtime. Patient taking differently: Take 100 mg by mouth 3 (three) times daily.  05/21/16   [name], DO  hydrALAZINE (APRESOLINE) 100 MG tablet Take 100 mg by mouth 2 (two) times daily.     Historical Provider, MD  isosorbide mononitrate (IMDUR) 60 MG 24 hr tablet Take 1 tablet (60 mg total) by mouth daily. 01/09/16   Smiley Houseman, MD  omeprazole (PRILOSEC) 20 MG capsule Take 20 mg by mouth daily. 07/01/13   Historical Provider, MD  ondansetron (ZOFRAN ODT) 4 MG disintegrating tablet Take 1 tablet (4 mg total) by mouth every 8 (eight) hours as needed for nausea or vomiting. 05/16/16   Sherwood Gambler, MD  oxycodone (OXY-IR) 5 MG capsule Take 1 capsule (5 mg total) by mouth every 4 (four) hours as needed for pain. 05/30/16   [name], MD  predniSONE (DELTASONE) 10 MG tablet Take 10 mg by mouth daily with breakfast.    Historical Provider, MD  sevelamer carbonate (RENVELA) 800 MG tablet Take 800-1,600 mg by mouth See admin instructions. Take 1600 mg by mouth 3 times daily with meals and take 800 mg by mouth with snacks.    Historical Provider, MD  warfarin (COUMADIN) 2 MG tablet Take 2 mg by mouth daily. Take with 548m tablet to equal 79m7/12/17   Historical Provider, MD  warfarin (COUMADIN) 5 MG tablet Take 5 mg by mouth daily. Take with 48m7mablet to equal 7mg74m12/17    Historical Provider, MD    Family History Family History  Problem Relation Age of Onset  . Hypertension Other     Social History Social History  Substance Use Topics  . Smoking status: Former Smoker    Packs/day: 0.00    Years: 1.00    Types: Cigarettes  . Smokeless tobacco: Never Used     Comment: quit Jan 2014  . Alcohol use No     Allergies   Butalbital-apap-caffeine; Ferrlecit [na ferric gluc cplx in sucrose]; Minoxidil; Darvocet [propoxyphene n-acetaminophen]; and Other   Review of Systems Review of Systems  All other systems reviewed and are negative.    Physical Exam Updated Vital Signs BP (!) 199/111 (BP Location: Right Arm)   Pulse 87   Temp 97.8 F (36.6 C) (Oral)   Resp 18   SpO2 100%   Physical Exam  Constitutional: He appears well-developed and well-nourished. No distress.  HENT:  Head: Normocephalic and atraumatic.  Right Ear: External ear normal.  Left Ear: External ear normal.  Eyes: Conjunctivae are normal. Right eye exhibits no discharge. Left eye exhibits no discharge. No scleral icterus.  Neck: Neck supple. No tracheal deviation present.  Cardiovascular: Normal rate.   Pulmonary/Chest: Effort normal. No stridor. No respiratory distress.  Abdominal: He exhibits no distension.  Musculoskeletal: He exhibits no edema.  Neurological: He is alert. Cranial nerve deficit: no gross deficits.  Skin: Skin is warm and dry. No rash noted.  Psychiatric: He has a normal mood and affect.  Nursing note and vitals reviewed.    ED Treatments / Results     Procedures Procedures (including critical care time)  Medications Ordered in ED Medications - No data to display   Initial Impression / Assessment and Plan / ED Course  I have reviewed the triage vital signs and the nursing notes.  Pertinent labs & imaging results that were available during my care of the patient were reviewed by me and considered in my medical decision making (see chart for  details).  Clinical Course as of Jun 15 747  Fri Jun 14, 2016  1915 D/w Dr Mercy Moore.  Will plan on dialysis today.  I ordered norvasc for his HTN.  Dr Mercy Moore recommended against that.  I cancelled the order but pt was already given the med.  Will monitor  [JK]    Clinical Course User Index [JK] Dorie Rank, MD   HTN noted.  Will give a dose of his oral medications.  Will consult with nephrology about dialysis  Final Clinical Impressions(s) / ED Diagnoses   Final diagnoses:  Chronic renal failure, unspecified CKD stage     Dorie Rank, MD 06/14/16 0748   3:37 PM  Pt has been holding in the ED.  Just notified that patient would not be getting dialysis today.  He will be discharged and need to return tomorrow per nephrology service.   Dorie Rank, MD 06/14/16 [phone]

## 2016-06-14 NOTE — ED Notes (Signed)
Pt refused to have a EKG done

## 2016-06-14 NOTE — ED Notes (Addendum)
Per dialysis RN patient unable to receive dialysis today. Is to return to hospital tomorrow for dialysis. Pt verbalized understanding. Pt refused vitals. Left ambulatory no active distress noted. Dr. Tomi Bamberger notified. Per Dr. Tomi Bamberger okay for pt to go and return tomorrow.

## 2016-06-14 NOTE — ED Triage Notes (Signed)
Pt here for dialysis, sts last session was Monday. Pt refusing furthur treatment past vitals signs in triage.

## 2016-06-14 NOTE — ED Notes (Signed)
Lunch tray ordered; regular diet

## 2016-06-15 DIAGNOSIS — E875 Hyperkalemia: Secondary | ICD-10-CM | POA: Diagnosis not present

## 2016-06-15 IMAGING — CR DG CHEST 2V
2 series · 2 of 2 positions shown · non-contrast
Comparison: 01/19/2014

CLINICAL DATA: Short of breath in upper back pain for 48 hr.

EXAM:
CHEST  2 VIEW

[w chest pa]
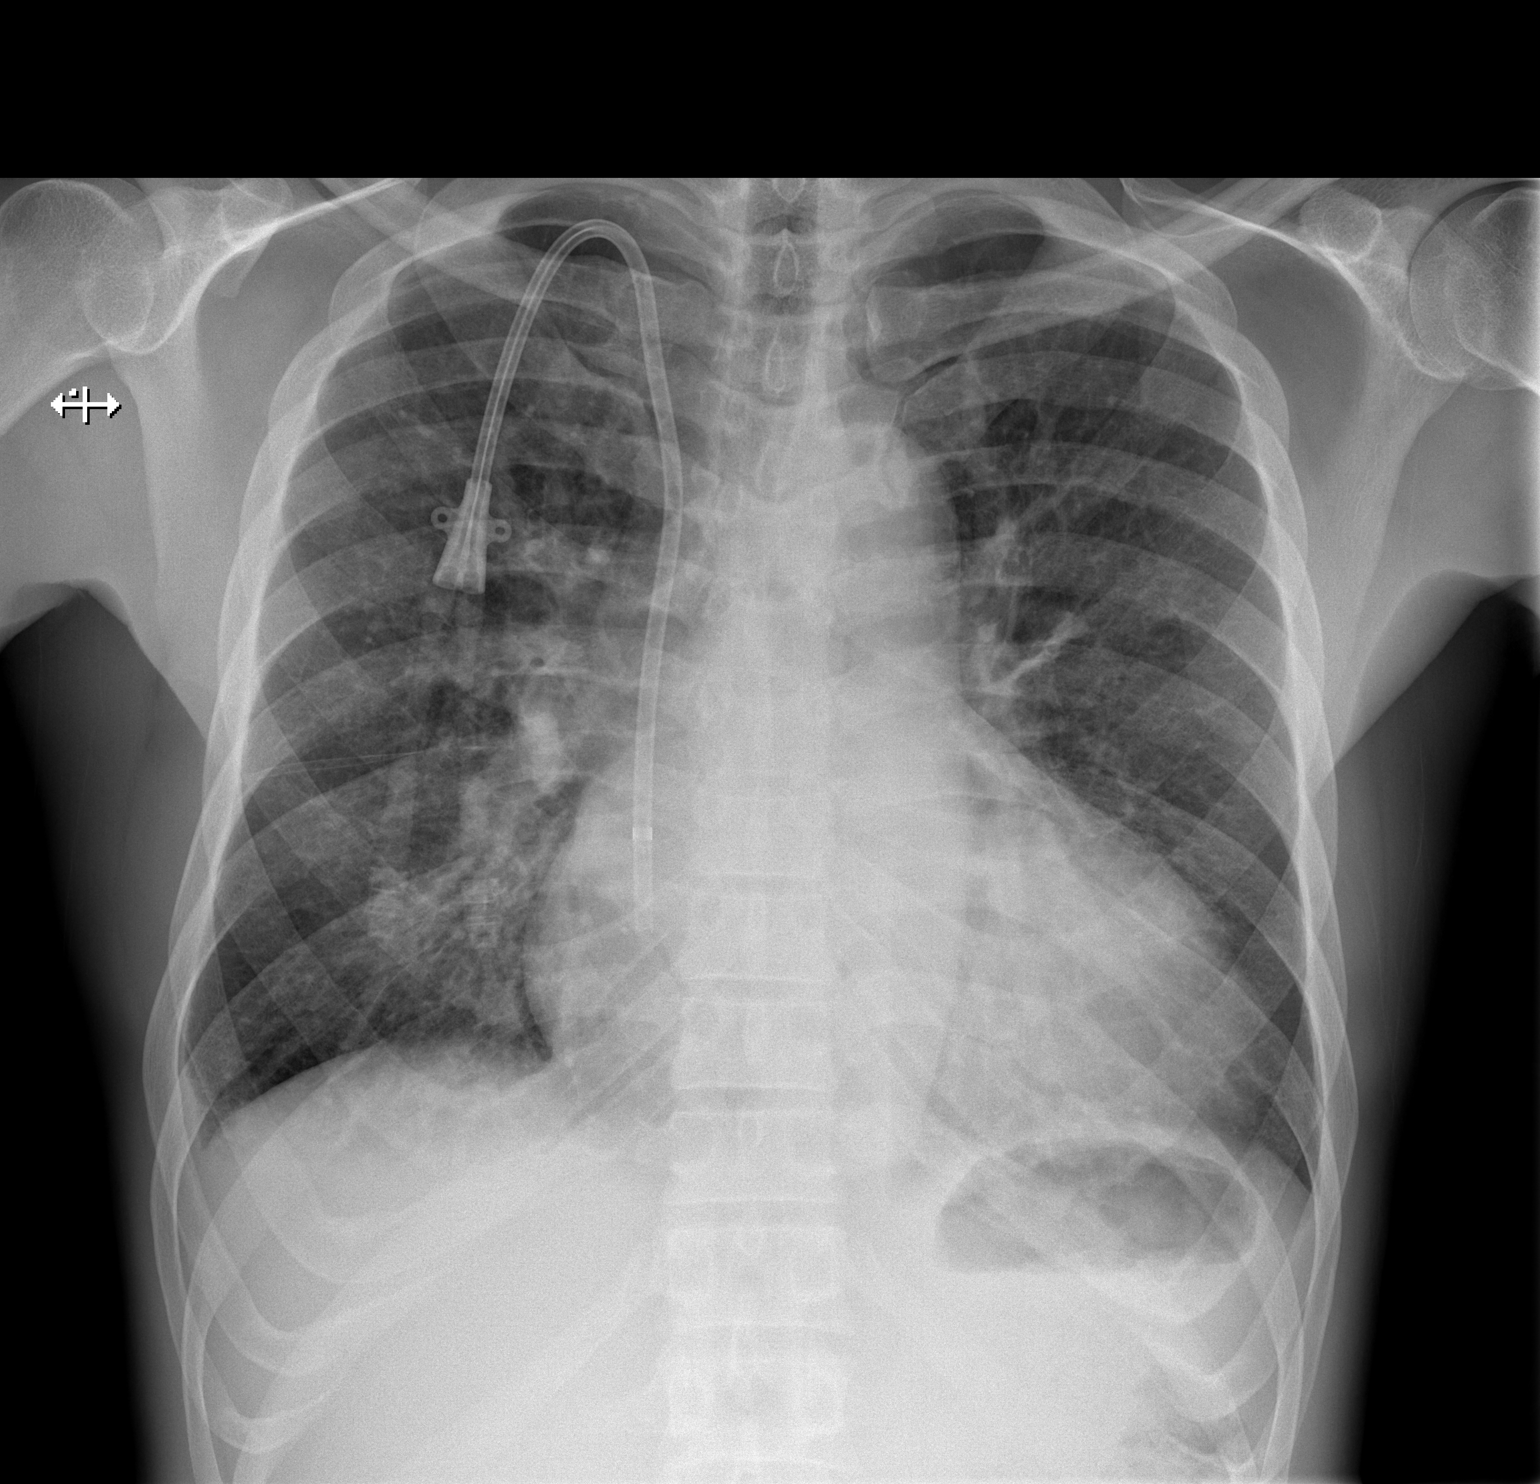

[w chest lat]
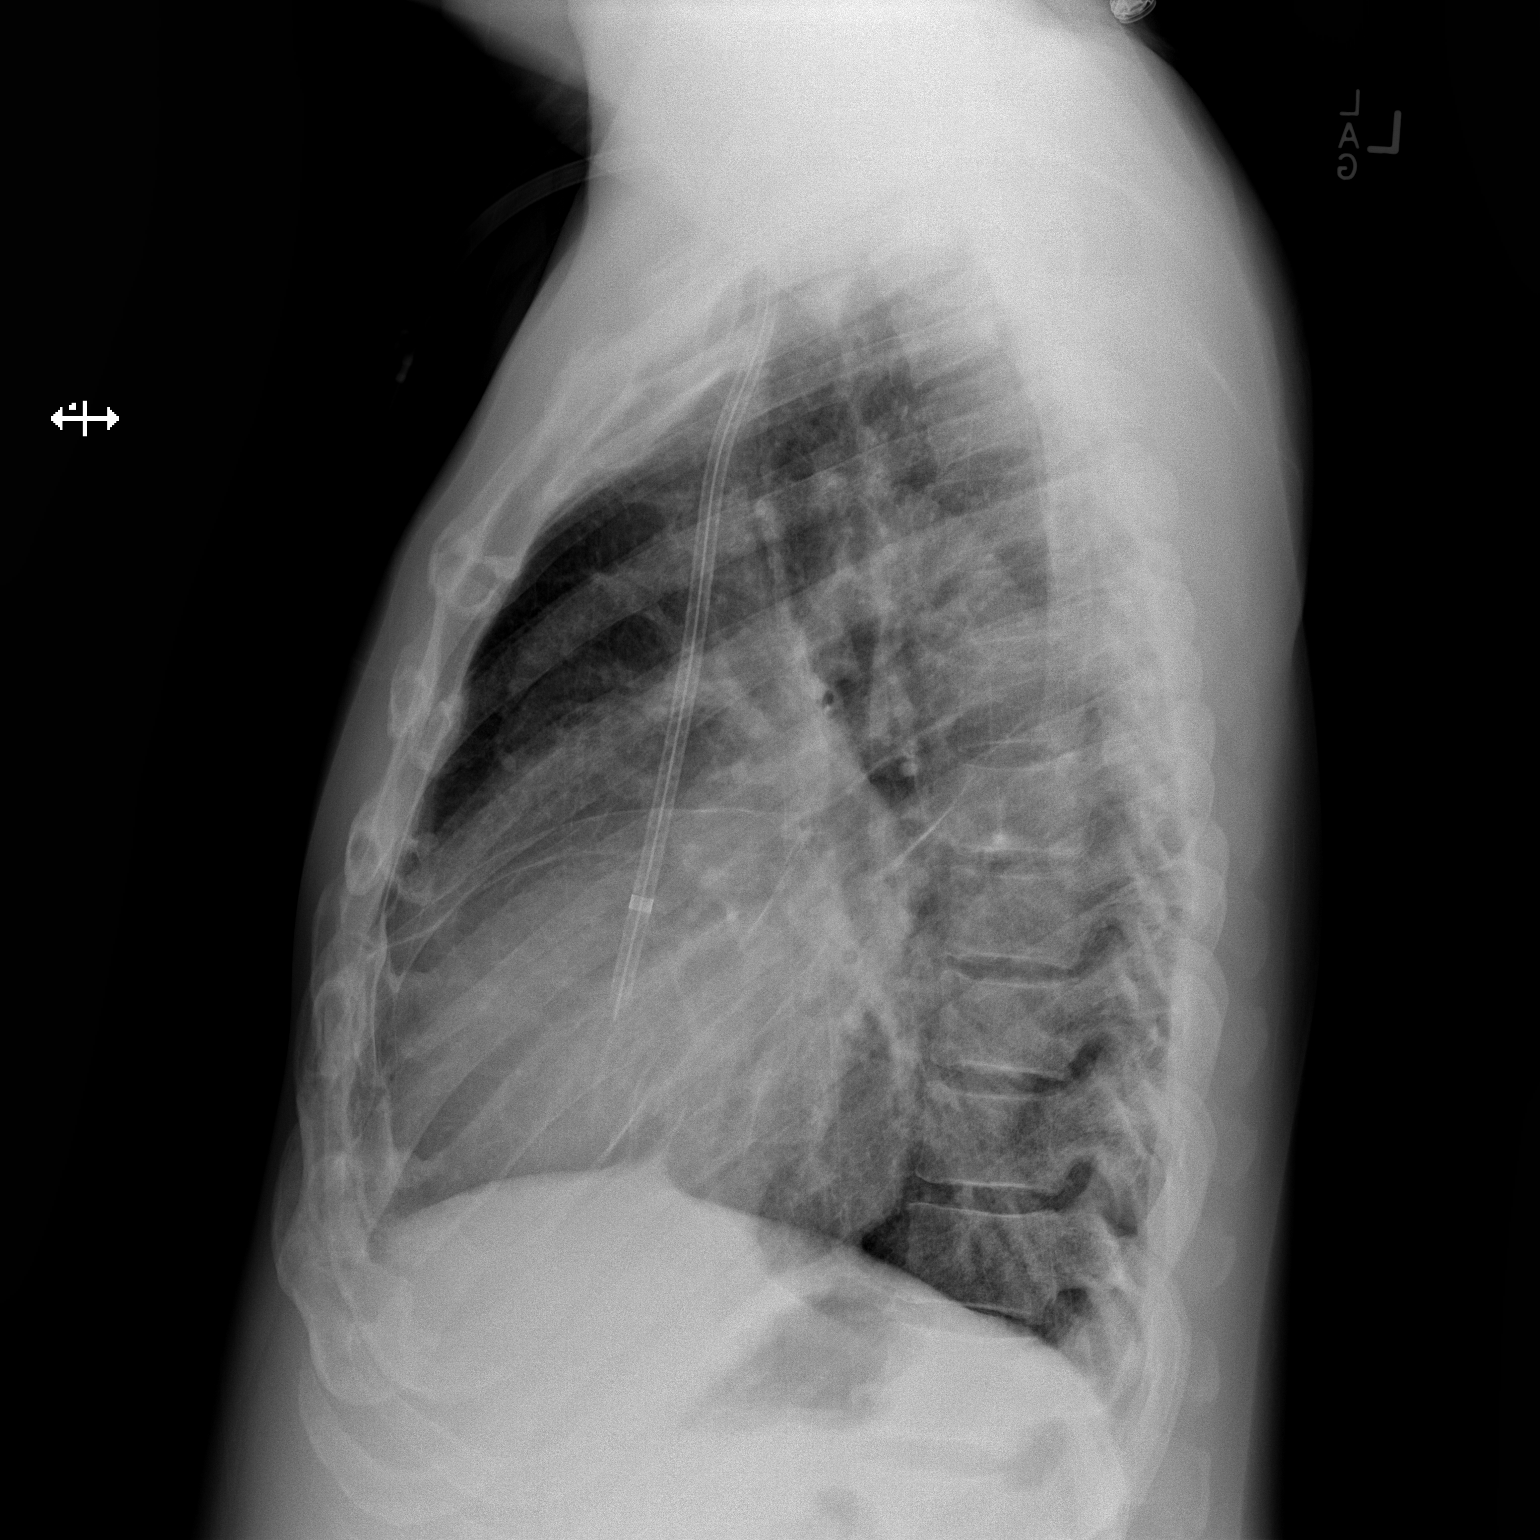

[2 of 2 positions shown; findings below may reference images not displayed]

FINDINGS: Cardiac enlargement with mild central vascular congestion. No
evidence of edema. No focal consolidation. Unchanged appearance of
right central venous catheter. No change since prior study.
IMPRESSION: Cardiac enlargement with mild vascular congestion.  No edema.

## 2016-06-15 IMAGING — CR DG CHEST 2V
3 series · 3 of 3 positions shown · non-contrast
Comparison: 01/25/2014

CLINICAL DATA: Shortness of breath.  Diabetes and hypertension.

EXAM:
CHEST  2 VIEW

[w chest lat]
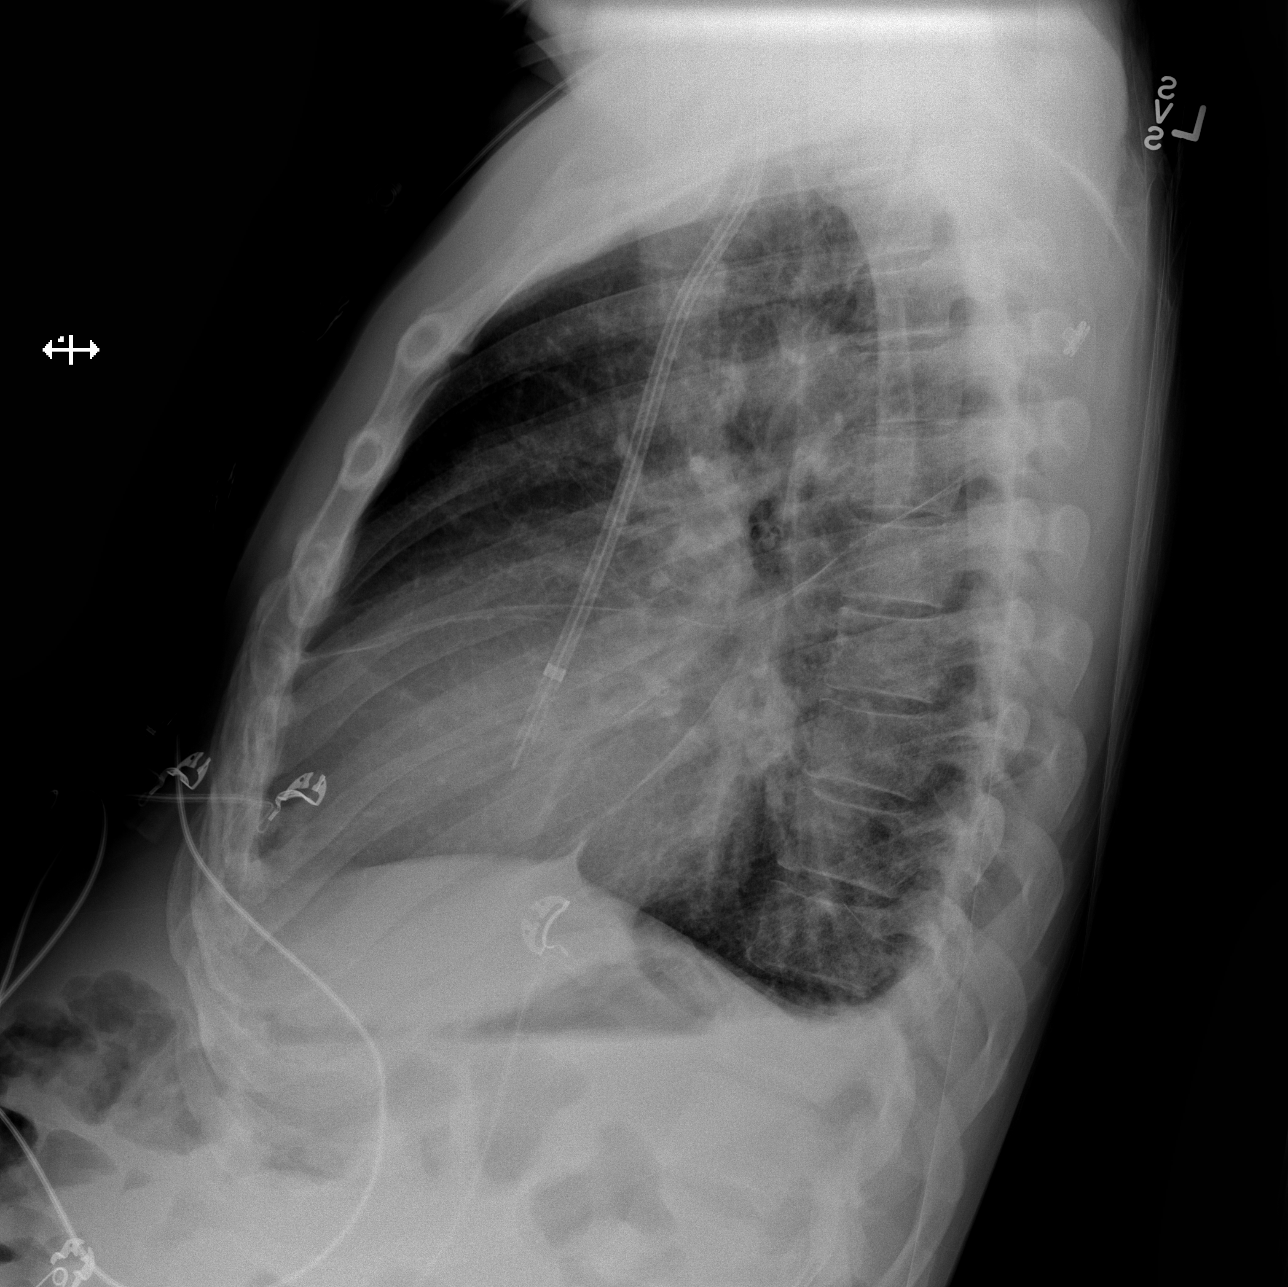

[x chest ap (1 of 2)]
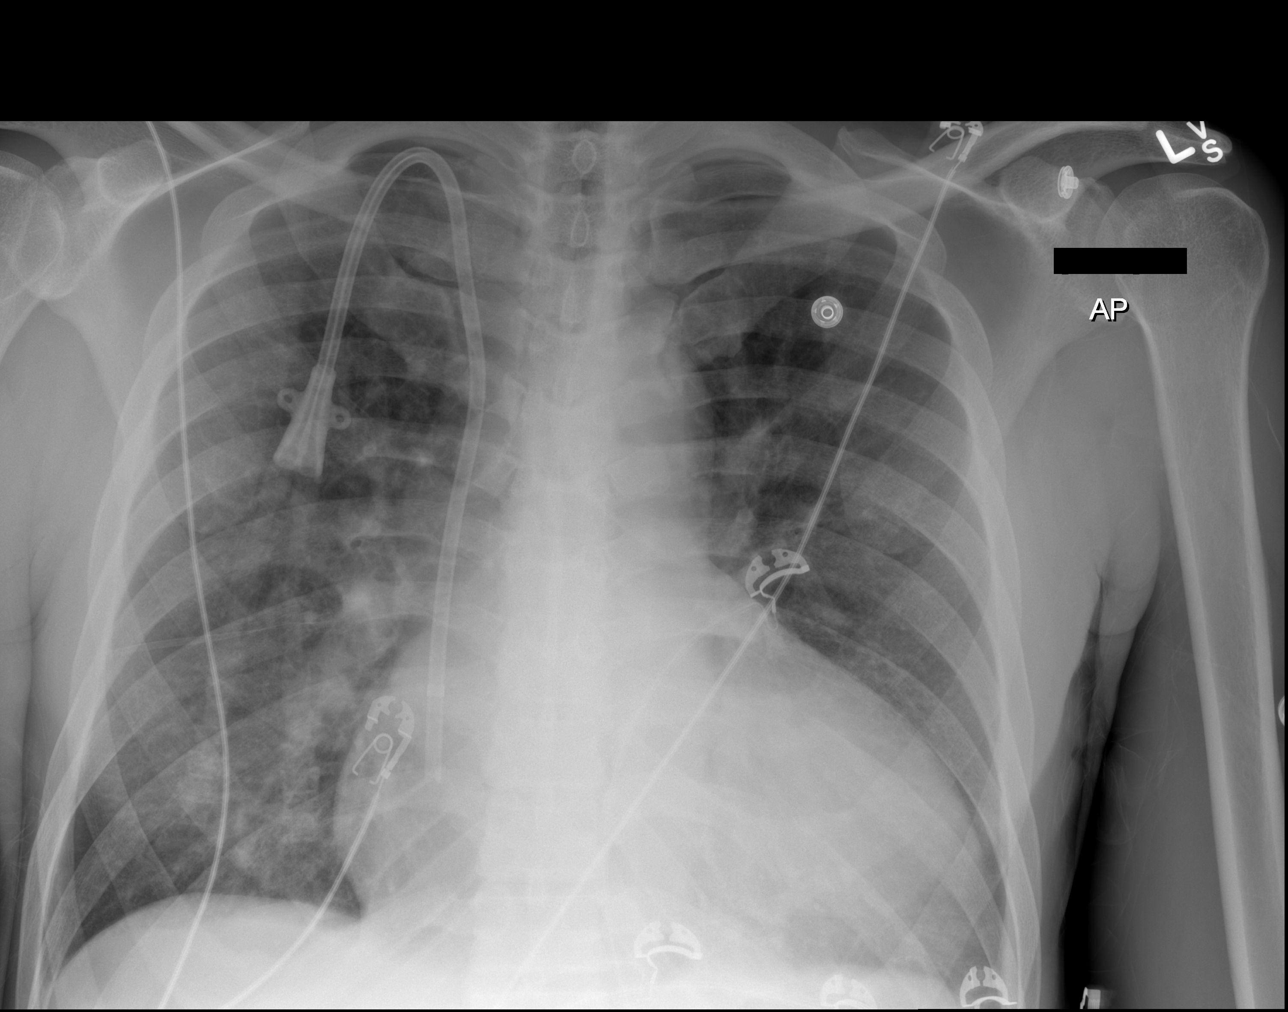

[x chest ap (2 of 2)]
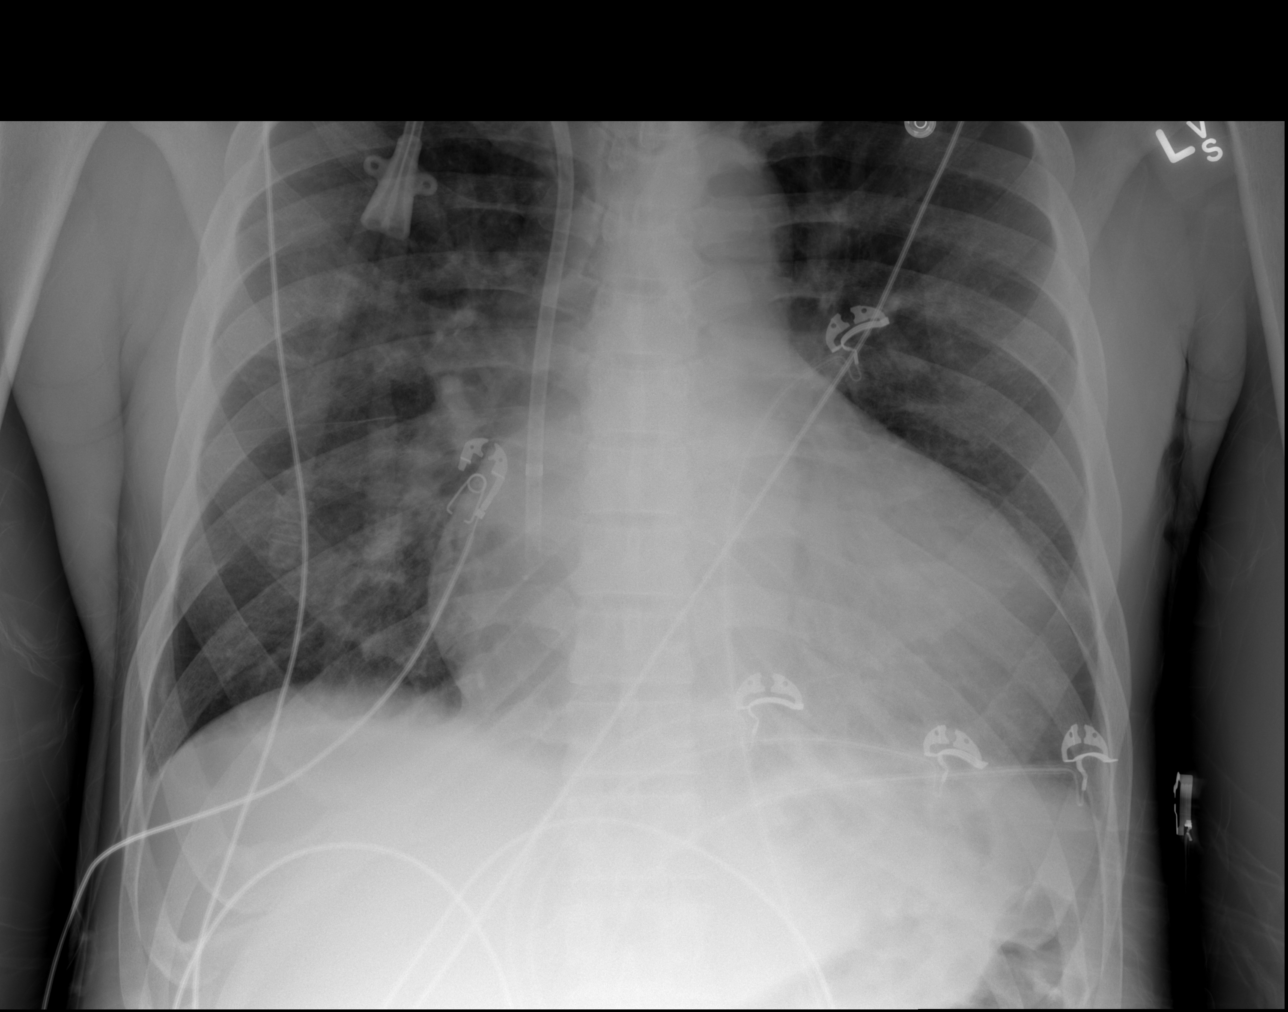

[3 of 3 positions shown; findings below may reference images not displayed]

FINDINGS: Moderate cardiac enlargement is stable. Pulmonary vascular
congestion is unchanged. No evidence of acute pulmonary infiltrate
or edema. Right internal jugular central venous dialysis catheter
remains in stable position.
IMPRESSION: Stable cardiac enlargement and pulmonary venous hypertension. No
active lung disease.

## 2016-06-16 DIAGNOSIS — Z7952 Long term (current) use of systemic steroids: Secondary | ICD-10-CM

## 2016-06-16 DIAGNOSIS — Z7901 Long term (current) use of anticoagulants: Secondary | ICD-10-CM

## 2016-06-16 DIAGNOSIS — Z86718 Personal history of other venous thrombosis and embolism: Secondary | ICD-10-CM

## 2016-06-16 DIAGNOSIS — Z888 Allergy status to other drugs, medicaments and biological substances status: Secondary | ICD-10-CM

## 2016-06-16 DIAGNOSIS — Z992 Dependence on renal dialysis: Secondary | ICD-10-CM

## 2016-06-16 DIAGNOSIS — I5042 Chronic combined systolic (congestive) and diastolic (congestive) heart failure: Secondary | ICD-10-CM | POA: Diagnosis present

## 2016-06-16 DIAGNOSIS — K219 Gastro-esophageal reflux disease without esophagitis: Secondary | ICD-10-CM | POA: Diagnosis present

## 2016-06-16 DIAGNOSIS — I132 Hypertensive heart and chronic kidney disease with heart failure and with stage 5 chronic kidney disease, or end stage renal disease: Secondary | ICD-10-CM | POA: Diagnosis present

## 2016-06-16 DIAGNOSIS — F4323 Adjustment disorder with mixed anxiety and depressed mood: Secondary | ICD-10-CM | POA: Diagnosis present

## 2016-06-16 DIAGNOSIS — Z9119 Patient's noncompliance with other medical treatment and regimen: Secondary | ICD-10-CM

## 2016-06-16 DIAGNOSIS — D631 Anemia in chronic kidney disease: Secondary | ICD-10-CM | POA: Diagnosis present

## 2016-06-16 DIAGNOSIS — F419 Anxiety disorder, unspecified: Secondary | ICD-10-CM | POA: Diagnosis present

## 2016-06-16 DIAGNOSIS — Z86711 Personal history of pulmonary embolism: Secondary | ICD-10-CM

## 2016-06-16 DIAGNOSIS — E1122 Type 2 diabetes mellitus with diabetic chronic kidney disease: Secondary | ICD-10-CM | POA: Diagnosis present

## 2016-06-16 DIAGNOSIS — Z79899 Other long term (current) drug therapy: Secondary | ICD-10-CM

## 2016-06-16 DIAGNOSIS — E875 Hyperkalemia: Principal | ICD-10-CM | POA: Diagnosis present

## 2016-06-16 DIAGNOSIS — Z940 Kidney transplant status: Secondary | ICD-10-CM

## 2016-06-16 DIAGNOSIS — I120 Hypertensive chronic kidney disease with stage 5 chronic kidney disease or end stage renal disease: Secondary | ICD-10-CM | POA: Diagnosis not present

## 2016-06-16 DIAGNOSIS — I429 Cardiomyopathy, unspecified: Secondary | ICD-10-CM | POA: Diagnosis present

## 2016-06-16 DIAGNOSIS — N186 End stage renal disease: Secondary | ICD-10-CM | POA: Diagnosis not present

## 2016-06-16 DIAGNOSIS — Z87891 Personal history of nicotine dependence: Secondary | ICD-10-CM

## 2016-06-16 DIAGNOSIS — F329 Major depressive disorder, single episode, unspecified: Secondary | ICD-10-CM | POA: Diagnosis present

## 2016-06-16 DIAGNOSIS — Z8249 Family history of ischemic heart disease and other diseases of the circulatory system: Secondary | ICD-10-CM

## 2016-06-17 ENCOUNTER — Inpatient Hospital Stay (HOSPITAL_COMMUNITY)
Admission: EM | Admit: 2016-06-17 | Discharge: 2016-06-18 | DRG: 640 | Disposition: A | Discharge status: Home / Self Care | Payer: Medicare Other | Attending: Internal Medicine | Admitting: Internal Medicine

## 2016-06-17 ENCOUNTER — Encounter (HOSPITAL_COMMUNITY): Payer: Self-pay | Admitting: Emergency Medicine

## 2016-06-17 DIAGNOSIS — Z940 Kidney transplant status: Secondary | ICD-10-CM | POA: Diagnosis not present

## 2016-06-17 DIAGNOSIS — N19 Unspecified kidney failure: Secondary | ICD-10-CM | POA: Diagnosis present

## 2016-06-17 DIAGNOSIS — I429 Cardiomyopathy, unspecified: Secondary | ICD-10-CM | POA: Diagnosis present

## 2016-06-17 DIAGNOSIS — I428 Other cardiomyopathies: Secondary | ICD-10-CM

## 2016-06-17 DIAGNOSIS — R110 Nausea: Secondary | ICD-10-CM | POA: Diagnosis not present

## 2016-06-17 DIAGNOSIS — F4323 Adjustment disorder with mixed anxiety and depressed mood: Secondary | ICD-10-CM | POA: Diagnosis present

## 2016-06-17 DIAGNOSIS — N185 Chronic kidney disease, stage 5: Secondary | ICD-10-CM | POA: Diagnosis not present

## 2016-06-17 DIAGNOSIS — Z8249 Family history of ischemic heart disease and other diseases of the circulatory system: Secondary | ICD-10-CM | POA: Diagnosis not present

## 2016-06-17 DIAGNOSIS — Z87891 Personal history of nicotine dependence: Secondary | ICD-10-CM | POA: Diagnosis not present

## 2016-06-17 DIAGNOSIS — I5042 Chronic combined systolic (congestive) and diastolic (congestive) heart failure: Secondary | ICD-10-CM | POA: Diagnosis present

## 2016-06-17 DIAGNOSIS — E1122 Type 2 diabetes mellitus with diabetic chronic kidney disease: Secondary | ICD-10-CM | POA: Diagnosis present

## 2016-06-17 DIAGNOSIS — Z79899 Other long term (current) drug therapy: Secondary | ICD-10-CM | POA: Diagnosis not present

## 2016-06-17 DIAGNOSIS — Z7901 Long term (current) use of anticoagulants: Secondary | ICD-10-CM | POA: Diagnosis not present

## 2016-06-17 DIAGNOSIS — N189 Chronic kidney disease, unspecified: Secondary | ICD-10-CM

## 2016-06-17 DIAGNOSIS — N186 End stage renal disease: Secondary | ICD-10-CM | POA: Diagnosis not present

## 2016-06-17 DIAGNOSIS — Z888 Allergy status to other drugs, medicaments and biological substances status: Secondary | ICD-10-CM | POA: Diagnosis not present

## 2016-06-17 DIAGNOSIS — K219 Gastro-esophageal reflux disease without esophagitis: Secondary | ICD-10-CM | POA: Diagnosis present

## 2016-06-17 DIAGNOSIS — R55 Syncope and collapse: Secondary | ICD-10-CM | POA: Diagnosis not present

## 2016-06-17 DIAGNOSIS — F329 Major depressive disorder, single episode, unspecified: Secondary | ICD-10-CM | POA: Diagnosis present

## 2016-06-17 DIAGNOSIS — F419 Anxiety disorder, unspecified: Secondary | ICD-10-CM | POA: Diagnosis present

## 2016-06-17 DIAGNOSIS — D631 Anemia in chronic kidney disease: Secondary | ICD-10-CM | POA: Diagnosis present

## 2016-06-17 DIAGNOSIS — Z7952 Long term (current) use of systemic steroids: Secondary | ICD-10-CM | POA: Diagnosis not present

## 2016-06-17 DIAGNOSIS — I10 Essential (primary) hypertension: Secondary | ICD-10-CM

## 2016-06-17 DIAGNOSIS — Z86711 Personal history of pulmonary embolism: Secondary | ICD-10-CM | POA: Diagnosis not present

## 2016-06-17 DIAGNOSIS — E875 Hyperkalemia: Secondary | ICD-10-CM | POA: Diagnosis not present

## 2016-06-17 DIAGNOSIS — Z5321 Procedure and treatment not carried out due to patient leaving prior to being seen by health care provider: Secondary | ICD-10-CM | POA: Diagnosis not present

## 2016-06-17 DIAGNOSIS — Z86718 Personal history of other venous thrombosis and embolism: Secondary | ICD-10-CM | POA: Diagnosis not present

## 2016-06-17 DIAGNOSIS — I132 Hypertensive heart and chronic kidney disease with heart failure and with stage 5 chronic kidney disease, or end stage renal disease: Secondary | ICD-10-CM | POA: Diagnosis present

## 2016-06-17 DIAGNOSIS — Z992 Dependence on renal dialysis: Secondary | ICD-10-CM | POA: Diagnosis not present

## 2016-06-17 DIAGNOSIS — I120 Hypertensive chronic kidney disease with stage 5 chronic kidney disease or end stage renal disease: Secondary | ICD-10-CM | POA: Diagnosis not present

## 2016-06-17 DIAGNOSIS — Z9119 Patient's noncompliance with other medical treatment and regimen: Secondary | ICD-10-CM | POA: Diagnosis not present

## 2016-06-17 LAB — CBC
HEMATOCRIT: 29.9 % — AB (ref 39.0–52.0)
HEMOGLOBIN: 9.5 g/dL — AB (ref 13.0–17.0)
MCH: 28.4 pg (ref 26.0–34.0)
MCHC: 31.8 g/dL (ref 30.0–36.0)
MCV: 89.5 fL (ref 78.0–100.0)
Platelets: 240 10*3/uL (ref 150–400)
RBC: 3.34 MIL/uL — AB (ref 4.22–5.81)
RDW: 16.8 % — ABNORMAL HIGH (ref 11.5–15.5)
WBC: 4.2 10*3/uL (ref 4.0–10.5)

## 2016-06-17 LAB — COMPREHENSIVE METABOLIC PANEL
ALBUMIN: 3.4 g/dL — AB (ref 3.5–5.0)
ALT: 9 U/L — ABNORMAL LOW (ref 17–63)
ANION GAP: 14 (ref 5–15)
AST: 17 U/L (ref 15–41)
Alkaline Phosphatase: 159 U/L — ABNORMAL HIGH (ref 38–126)
BUN: 65 mg/dL — ABNORMAL HIGH (ref 6–20)
CHLORIDE: 102 mmol/L (ref 101–111)
CO2: 23 mmol/L (ref 22–32)
Calcium: 8.1 mg/dL — ABNORMAL LOW (ref 8.9–10.3)
Creatinine, Ser: 17.15 mg/dL — ABNORMAL HIGH (ref 0.61–1.24)
GFR calc non Af Amer: 3 mL/min — ABNORMAL LOW (ref >60)
GFR, EST AFRICAN AMERICAN: 3 mL/min — AB (ref >60)
GLUCOSE: 70 mg/dL (ref 65–99)
Potassium: >7.5 mmol/L (ref 3.5–5.1)
Sodium: 139 mmol/L (ref 135–145)
Total Bilirubin: 0.6 mg/dL (ref 0.3–1.2)
Total Protein: 7.5 g/dL (ref 6.5–8.1)

## 2016-06-17 LAB — CBG MONITORING, ED
Glucose-Capillary: 101 mg/dL — ABNORMAL HIGH (ref 65–99)
Glucose-Capillary: 70 mg/dL (ref 65–99)

## 2016-06-17 LAB — I-STAT CHEM 8, ED
BUN: 41 mg/dL — AB (ref 6–20)
CALCIUM ION: 1.07 mmol/L — AB (ref 1.15–1.40)
CHLORIDE: 99 mmol/L — AB (ref 101–111)
Creatinine, Ser: 10.2 mg/dL — ABNORMAL HIGH (ref 0.61–1.24)
GLUCOSE: 109 mg/dL — AB (ref 65–99)
HCT: 31 % — ABNORMAL LOW (ref 39.0–52.0)
Hemoglobin: 10.5 g/dL — ABNORMAL LOW (ref 13.0–17.0)
POTASSIUM: 4.7 mmol/L (ref 3.5–5.1)
SODIUM: 139 mmol/L (ref 135–145)
TCO2: 27 mmol/L (ref 0–100)

## 2016-06-17 LAB — PROTIME-INR
INR: 1.01
Prothrombin Time: 13.3 seconds (ref 11.4–15.2)

## 2016-06-17 LAB — POTASSIUM: Potassium: 7.1 mmol/L (ref 3.5–5.1)

## 2016-06-17 LAB — MRSA PCR SCREENING: MRSA BY PCR: NEGATIVE

## 2016-06-17 LAB — LIPASE, BLOOD: LIPASE: 39 U/L (ref 11–51)

## 2016-06-17 MED ORDER — SODIUM CHLORIDE 0.9 % IV SOLN
1.0000 g | Freq: Once | INTRAVENOUS | Status: AC
  Administered 2016-06-17: 1 g via INTRAVENOUS
  Filled 2016-06-17: qty 10

## 2016-06-17 MED ORDER — HYDRALAZINE HCL 50 MG PO TABS
100.0000 mg | ORAL_TABLET | Freq: Two times a day (BID) | ORAL | Status: DC
  Administered 2016-06-17: 100 mg via ORAL
  Filled 2016-06-17 (×2): qty 2

## 2016-06-17 MED ORDER — ALLOPURINOL 100 MG PO TABS
100.0000 mg | ORAL_TABLET | Freq: Every day | ORAL | Status: DC
  Administered 2016-06-17: 100 mg via ORAL
  Filled 2016-06-17: qty 1

## 2016-06-17 MED ORDER — SEVELAMER CARBONATE 800 MG PO TABS
1600.0000 mg | ORAL_TABLET | Freq: Three times a day (TID) | ORAL | Status: DC
  Administered 2016-06-17: 1600 mg via ORAL
  Filled 2016-06-17: qty 2

## 2016-06-17 MED ORDER — INSULIN ASPART 100 UNIT/ML IV SOLN
10.0000 [IU] | Freq: Once | INTRAVENOUS | Status: AC
  Administered 2016-06-17: 10 [IU] via INTRAVENOUS
  Filled 2016-06-17: qty 1

## 2016-06-17 MED ORDER — CARVEDILOL 25 MG PO TABS
25.0000 mg | ORAL_TABLET | Freq: Two times a day (BID) | ORAL | Status: DC
  Administered 2016-06-17: 25 mg via ORAL
  Filled 2016-06-17: qty 2

## 2016-06-17 MED ORDER — ONDANSETRON HCL 4 MG/2ML IJ SOLN: 4.0000 mg | Freq: Four times a day (QID) | INTRAMUSCULAR | Status: DC | PRN

## 2016-06-17 MED ORDER — OXYCODONE HCL 5 MG PO TABS
5.0000 mg | ORAL_TABLET | ORAL | Status: DC | PRN
  Administered 2016-06-17 – 2016-06-18 (×2): 5 mg via ORAL
  Filled 2016-06-17: qty 1

## 2016-06-17 MED ORDER — CINACALCET HCL 30 MG PO TABS
30.0000 mg | ORAL_TABLET | Freq: Every day | ORAL | Status: DC
  Administered 2016-06-17: 30 mg via ORAL
  Filled 2016-06-17: qty 1

## 2016-06-17 MED ORDER — AMLODIPINE BESYLATE 10 MG PO TABS
10.0000 mg | ORAL_TABLET | Freq: Every day | ORAL | Status: DC
  Administered 2016-06-17: 10 mg via ORAL
  Filled 2016-06-17: qty 2

## 2016-06-17 MED ORDER — ISOSORBIDE MONONITRATE ER 60 MG PO TB24
60.0000 mg | ORAL_TABLET | Freq: Every day | ORAL | Status: DC
  Administered 2016-06-17: 60 mg via ORAL
  Filled 2016-06-17: qty 2

## 2016-06-17 MED ORDER — DOXAZOSIN MESYLATE 2 MG PO TABS
2.0000 mg | ORAL_TABLET | Freq: Every day | ORAL | Status: DC
  Administered 2016-06-17: 2 mg via ORAL
  Filled 2016-06-17: qty 1

## 2016-06-17 MED ORDER — SEVELAMER CARBONATE 800 MG PO TABS: 800.0000 mg | ORAL_TABLET | ORAL | Status: DC | PRN

## 2016-06-17 MED ORDER — ALBUTEROL SULFATE (2.5 MG/3ML) 0.083% IN NEBU
10.0000 mg | INHALATION_SOLUTION | Freq: Once | RESPIRATORY_TRACT | Status: AC
  Administered 2016-06-17: 10 mg via RESPIRATORY_TRACT
  Filled 2016-06-17: qty 12

## 2016-06-17 MED ORDER — FENTANYL CITRATE (PF) 100 MCG/2ML IJ SOLN
50.0000 ug | Freq: Once | INTRAMUSCULAR | Status: AC
  Administered 2016-06-17: 50 ug via INTRAVENOUS
  Filled 2016-06-17: qty 2

## 2016-06-17 MED ORDER — DARBEPOETIN ALFA 100 MCG/0.5ML IJ SOSY
100.0000 ug | PREFILLED_SYRINGE | Freq: Once | INTRAMUSCULAR | Status: AC
  Administered 2016-06-17: 100 ug via SUBCUTANEOUS
  Filled 2016-06-17: qty 0.5

## 2016-06-17 MED ORDER — DARBEPOETIN ALFA 100 MCG/0.5ML IJ SOSY
100.0000 ug | PREFILLED_SYRINGE | Freq: Once | INTRAMUSCULAR | Status: DC
  Filled 2016-06-17: qty 0.5

## 2016-06-17 MED ORDER — DEXTROSE 50 % IV SOLN
1.0000 | Freq: Once | INTRAVENOUS | Status: AC
  Administered 2016-06-17: 50 mL via INTRAVENOUS
  Filled 2016-06-17: qty 50

## 2016-06-17 MED ORDER — FAMOTIDINE 20 MG PO TABS
20.0000 mg | ORAL_TABLET | Freq: Two times a day (BID) | ORAL | Status: DC
  Administered 2016-06-17 (×2): 20 mg via ORAL
  Filled 2016-06-17 (×2): qty 1

## 2016-06-17 MED ORDER — WARFARIN SODIUM 10 MG PO TABS
10.0000 mg | ORAL_TABLET | Freq: Once | ORAL | Status: AC
  Administered 2016-06-17: 10 mg via ORAL
  Filled 2016-06-17: qty 1

## 2016-06-17 MED ORDER — ONDANSETRON HCL 4 MG/2ML IJ SOLN: 4.0000 mg | Freq: Once | INTRAMUSCULAR | Status: DC | PRN

## 2016-06-17 MED ORDER — CLONIDINE HCL 0.3 MG/24HR TD PTWK
0.3000 mg | MEDICATED_PATCH | TRANSDERMAL | Status: DC
  Administered 2016-06-17: 0.3 mg via TRANSDERMAL
  Filled 2016-06-17: qty 1

## 2016-06-17 MED ORDER — PANTOPRAZOLE SODIUM 40 MG PO TBEC
40.0000 mg | DELAYED_RELEASE_TABLET | Freq: Every day | ORAL | Status: DC
  Administered 2016-06-17: 40 mg via ORAL
  Filled 2016-06-17: qty 1

## 2016-06-17 MED ORDER — ONDANSETRON HCL 4 MG PO TABS
4.0000 mg | ORAL_TABLET | Freq: Four times a day (QID) | ORAL | Status: DC | PRN
  Administered 2016-06-17: 4 mg via ORAL
  Filled 2016-06-17: qty 1

## 2016-06-17 MED ORDER — WARFARIN - PHARMACIST DOSING INPATIENT
Freq: Every day | Status: DC
  Administered 2016-06-17: 18:00:00

## 2016-06-17 MED ORDER — DEXTROSE 10 % IV SOLN
Freq: Once | INTRAVENOUS | Status: AC
  Administered 2016-06-17: 05:00:00 via INTRAVENOUS

## 2016-06-17 MED ORDER — ONDANSETRON 4 MG PO TBDP: 4.0000 mg | ORAL_TABLET | Freq: Three times a day (TID) | ORAL | Status: DC | PRN

## 2016-06-17 MED ORDER — PREDNISONE 10 MG PO TABS
10.0000 mg | ORAL_TABLET | Freq: Every day | ORAL | Status: DC
  Administered 2016-06-17: 10 mg via ORAL
  Filled 2016-06-17: qty 1

## 2016-06-17 MED ORDER — GABAPENTIN 100 MG PO CAPS
100.0000 mg | ORAL_CAPSULE | Freq: Three times a day (TID) | ORAL | Status: DC
  Administered 2016-06-17 (×2): 100 mg via ORAL
  Filled 2016-06-17 (×2): qty 1

## 2016-06-17 MED ORDER — ATORVASTATIN CALCIUM 40 MG PO TABS
40.0000 mg | ORAL_TABLET | Freq: Every day | ORAL | Status: DC
  Administered 2016-06-17: 40 mg via ORAL
  Filled 2016-06-17: qty 1

## 2016-06-17 NOTE — Consult Note (Addendum)
Renal Service Consult Note [facility]  Frank [name] 06/17/2016 Frank [name] Requesting Physician:  Dr Hal Hope  Reason for Consult:  ESRD pt w hyperkalemia HPI: The patient is a 50 y.o. year-old w history of DM2, HTN, NICM and ESRD.  He is currently without an outpatient hemodialysis unit and is dependent on hospital HD for his dialysis needs. Comes to ED asking for dialysis, feeling tired , K 7, nauseous and vomiting.    Has been getting inpatient HD for the 1-2 months either at [facility] or at [facility].  Also reports history of "DVT" approx 2 yrs ago and was supposed to be treated for 3-6 months but they have kept him on coumadin for the last approx 1-2 years.  Review of chart shows patient had a LUL subsegmental PE presenting with CP in Oct 2015, which at the time was suspected to be related to his HD catheter.  In May of 2016 he had a RUE doppler showing acute/ subacute thrombosis of the RUE brachial and basilic veins and was restarted/ resumed on coumadin.  No PE associated w that finding.   Had renal transplant 2006 > 2014 and went back on HD around 2014.  Anemia - has rec'd total 450 ug darbepoetin in the last 30 days.  Hb 10 range now.  Last tsat here 22% on 11/8.  Refuses IV iron usually, is listed as allergic to ferric gluconate per chart. He did get Feraheme here in April 2017 w/o incident.   ROS  denies CP  no joint pain   no HA  no blurry vision  no rash  no dysuria  no difficulty voiding  no change in urine color    Past Medical History  Past Medical History:  Diagnosis Date  . Anemia   . Anxiety   . Chronic combined systolic and diastolic CHF (congestive heart failure) (HCC)    a. EF 20-25% by echo in 08/2015 b. echo 10/2015: EF 35-40%, diffuse HK, severe LAE, moderate RAE, small pericardial effusion  . Complication of anesthesia    itching, sore throat  . Depression   . Dialysis patient ([place])   . ESRD (end stage renal disease) ([place])     due to HTN per patient, followed at [facility], s/p failed kidney transplant - dialysis Tue, Th, Sat  . Hyperkalemia 12/2015  . Hypertension   . Junctional rhythm    a. noted in 08/2015: hyperkalemic at that time  b. 12/2015: presented in junctional rhythm w/ K+ of 6.6. Resolved with improvement of K+ levels.  . Nonischemic cardiomyopathy ([place])    a. 08/2014: cath showing minimal CAD, but tortuous arteries noted.   . Personal history of DVT (deep vein thrombosis)/ PE 05/26/2016   In Oct 2015 had small subsemental LUL PE w/o DVT (LE dopplers neg) and was felt to be HD cath related, treated w coumadin.  IN May 2016 had small vein DVT (acute/subacute) in the R basilic/ brachial veins of the RUE, resumed on coumadin.  Had R sided HD cath at that time.    . Renal insufficiency   . Shortness of breath   . Type II diabetes mellitus (HCC)    No history per patient, but remains under history as A1c would not be accurate given on dialysis   Past Surgical History  Past Surgical History:  Procedure Laterality Date  . CAPD INSERTION    . CAPD REMOVAL    . INGUINAL HERNIA REPAIR Right 02/14/2015   Procedure: REPAIR INCARCERATED  RIGHT INGUINAL HERNIA;  Surgeon: [name], MD;  Location: [facility];  Service: General;  Laterality: Right;  . INSERTION OF DIALYSIS CATHETER Right 09/23/2015   Procedure: exchange of Right internal Dialysis Catheter.;  Surgeon: [name], MD;  Location: [place];  Service: Vascular;  Laterality: Right;  . KIDNEY RECEIPIENT  2006   failed and started HD in March 2014  . LEFT HEART CATHETERIZATION WITH CORONARY ANGIOGRAM N/A 09/02/2014   Procedure: LEFT HEART CATHETERIZATION WITH CORONARY ANGIOGRAM;  Surgeon: [name], MD;  Location: [facility] CATH LAB;  Service: Cardiovascular;  Laterality: N/A;   Family History  Family History  Problem Relation Age of Onset  . Hypertension Other    Social History  reports that he has quit smoking. His smoking use included Cigarettes. He smoked  0.00 packs per day for 1.00 year. He has never used smokeless tobacco. He reports that he does not drink alcohol or use drugs. Allergies  Allergies  Allergen Reactions  . Butalbital-Apap-Caffeine Shortness Of Breath, Swelling and Other (See Comments)    Swelling in throat  . Ferrlecit [Na Ferric Gluc Cplx In Sucrose] Shortness Of Breath, Swelling and Other (See Comments)    Swelling in throat  . Minoxidil Shortness Of Breath  . Darvocet [Propoxyphene N-Acetaminophen] Hives  . Other Hives and Other (See Comments)    Allergy listed on file from [facility] (Pt did not mention any allergies) Pt able to do norco (tylenol)   Home medications Prior to Admission medications   Medication Sig Start Date End Date Taking? Authorizing Provider  allopurinol (ZYLOPRIM) 100 MG tablet Take 100 mg by mouth daily.    Historical Provider, MD  amLODipine (NORVASC) 10 MG tablet Take 10 mg by mouth daily.    Historical Provider, MD  atorvastatin (LIPITOR) 40 MG tablet Take 1 tablet (40 mg total) by mouth daily at 6 PM. 09/02/14   Barton Dubois, MD  carvedilol (COREG) 25 MG tablet Take 25 mg by mouth 2 (two) times daily with a meal.    Historical Provider, MD  cinacalcet (SENSIPAR) 30 MG tablet Take 30 mg by mouth daily.    Historical Provider, MD  cloNIDine (CATAPRES - DOSED IN MG/24 HR) 0.3 mg/24hr patch Place 0.3 mg onto the skin once a week.    Historical Provider, MD  doxazosin (CARDURA) 2 MG tablet Take 2 mg by mouth daily.    Historical Provider, MD  famotidine (PEPCID) 20 MG tablet Take 1 tablet (20 mg total) by mouth 2 (two) times daily. 02/13/16   Charlesetta Shanks, MD  gabapentin (NEURONTIN) 100 MG capsule Take 1 capsule (100 mg total) by mouth at bedtime. Patient taking differently: Take 100 mg by mouth 3 (three) times daily.  05/21/16   [name], DO  hydrALAZINE (APRESOLINE) 100 MG tablet Take 100 mg by mouth 2 (two) times daily.     Historical Provider, MD  isosorbide mononitrate (IMDUR) 60 MG 24  hr tablet Take 1 tablet (60 mg total) by mouth daily. 01/09/16   Smiley Houseman, MD  omeprazole (PRILOSEC) 20 MG capsule Take 20 mg by mouth daily. 07/01/13   Historical Provider, MD  ondansetron (ZOFRAN ODT) 4 MG disintegrating tablet Take 1 tablet (4 mg total) by mouth every 8 (eight) hours as needed for nausea or vomiting. 05/16/16   Sherwood Gambler, MD  oxycodone (OXY-IR) 5 MG capsule Take 1 capsule (5 mg total) by mouth every 4 (four) hours as needed for pain. 05/30/16   [place]  Vivi Ferns, MD  predniSONE (DELTASONE) 10 MG tablet Take 10 mg by mouth daily with breakfast.    Historical Provider, MD  sevelamer carbonate (RENVELA) 800 MG tablet Take 800-1,600 mg by mouth See admin instructions. Take 1600 mg by mouth 3 times daily with meals and take 800 mg by mouth with snacks.    Historical Provider, MD  warfarin (COUMADIN) 2 MG tablet Take 2 mg by mouth daily. Take with 47m tablet to equal 729m7/12/17   Historical Provider, MD  warfarin (COUMADIN) 5 MG tablet Take 5 mg by mouth daily. Take with 47m75mablet to equal 7mg31m12/17   Historical Provider, MD   Liver Function Tests  Recent Labs Lab 06/17/16 0022  AST 17  ALT 9*  ALKPHOS 159*  BILITOT 0.6  PROT 7.5  ALBUMIN 3.4*    Recent Labs Lab 06/17/16 0022  LIPASE 39   CBC  Recent Labs Lab 06/17/16 0022 06/17/16 0738  WBC 4.2  --   HGB 9.5* 10.5*  HCT 29.9* 31.0*  MCV 89.5  --   PLT 240  --    Basic Metabolic Panel  Recent Labs Lab 06/17/16 0022 06/17/16 0235 06/17/16 0738  NA 139  --  139  K >7.5* 7.1* 4.7  CL 102  --  99*  CO2 23  --   --   GLUCOSE 70  --  109*  BUN 65*  --  41*  CREATININE 17.15*  --  10.20*  CALCIUM 8.1*  --   --    Iron/TIBC/Ferritin/ %Sat    Component Value Date/Time   IRON 41 (L) 05/29/2016 1350   TIBC 190 (L) 05/29/2016 1350   FERRITIN 328 11/02/2015 0346   IRONPCTSAT 22 05/29/2016 1350    Vitals:   06/17/16 0700 06/17/16 0725 06/17/16 0800 06/17/16 0830  BP: 134/82 155/99 160/94  (!) 171/111  Pulse: 83 89 78 84  Resp: 16 16    Temp:      TempSrc:      SpO2:      Weight:       Exam Gen pale and uncomfortable appearing No rash, cyanosis or gangrene Sclera anicteric, throat clear No jvd or bruits Chest clear bilat RRR no MRG Abd mild diffuse tendereness, no rebound, +bs GU normal male MS no joint effusions or deformity Ext no LE or UE edema / no wounds or ulcers Neuro is alert, Ox 3 , nf LUA AVF maturing/ R IJ cath     Dialysis: patient has no OP HD unit  Assessment: 1. Hyperkalemia 2. Nausea/ vomiting - poss d/t uremia 3. HTN uncontrolled 4. ESRD -- pt had no OP HD unit, is dependent on hospital HD units for his dialysis needs 5. Hx DVT/ PE - see above, has had 2 episodes (1 LUL PE in 2015, other RUE brachial/ basilic thrombosis [id number] PE), ostensibly could have all been related to his chronic indwelling HD cath.  Would keep on coumadin while he has indwelling HD cath. If HD cath comes out at some point would consider dc coumadin.   6. NICM 7. Volume - no gross vol excess on exam 8. HTN - uncont, on 4 bp meds 9. Anemia - as above, give darbe 100 ug today (last dose 10 d ago), Hb 10.5, last tsat 2%, refuses IV iron d/t allergy    Plan - HD today and again tomorrow  Rob Kelly [facility] pager [phone]/27/2017, 9:36 AM

## 2016-06-17 NOTE — ED Provider Notes (Signed)
Medical screening examination/treatment/procedure(s) were conducted as a shared visit with non-physician practitioner(s) and myself.  I personally evaluated the patient during the encounter.   EKG Interpretation  Date/Time:  Monday June 17 2016 02:38:06 EST Ventricular Rate:  61 PR Interval:  202 QRS Duration: 94 QT Interval:  494 QTC Calculation: 497 R Axis:   -60 Text Interpretation:  Normal sinus rhythm Left axis deviation Left ventricular hypertrophy with repolarization abnormality Prolonged QT Abnormal ECG No significant change since last tracing Confirmed by Makayli Bracken,  DO, Jerami Tammen [phone]) on 06/17/2016 2:44:20 AM      Pt is a 50 y.o. male with history of end-stage renal disease on dialysis Tuesday, Thursday and Saturday who last had dialysis on Thursday, missed Saturday. Presents with vomiting, lower back pain, not feeling well. No chest or shortness of breath. Potassium is 7.1 with no significant EKG change. We will treat his hyperkalemia. Discussed with nephrology for dialysis. He is also hypertensive.   [employer], DO 06/17/16 [phone]

## 2016-06-17 NOTE — Progress Notes (Addendum)
Patient refusing skin assessment at this time, states he's not feeling well. Will try again later.   Pharmacy has been paged to verify medications.

## 2016-06-17 NOTE — Progress Notes (Signed)
Patient refused to complete medication history, pharmacy will verify hydralazine for elevated blood pressure.

## 2016-06-17 NOTE — Progress Notes (Signed)
ANTICOAGULATION CONSULT NOTE - Initial Consult  Pharmacy Consult for Warfarin Indication: Hx PE/DVT  Allergies  Allergen Reactions  . Butalbital-Apap-Caffeine Shortness Of Breath, Swelling and Other (See Comments)    Swelling in throat  . Ferrlecit [Na Ferric Gluc Cplx In Sucrose] Shortness Of Breath, Swelling and Other (See Comments)    Swelling in throat  . Minoxidil Shortness Of Breath  . Darvocet [Propoxyphene N-Acetaminophen] Hives    Patient Measurements: Weight: 166 lb 7.2 oz (75.5 kg)  Vital Signs: Temp: 98.5 F (36.9 C) (11/27 1020) Temp Source: Oral (11/27 1020) BP: 171/97 (11/27 1100) Pulse Rate: 84 (11/27 1100)  Labs:  Recent Labs  06/17/16 0022 06/17/16 0738 06/17/16 1055  HGB 9.5* 10.5*  --   HCT 29.9* 31.0*  --   PLT 240  --   --   LABPROT  --   --  13.3  INR  --   --  1.01  CREATININE 17.15* 10.20*  --     Estimated Creatinine Clearance: 9 mL/min (by C-G formula based on SCr of 10.2 mg/dL (H)).   Medical History: Past Medical History:  Diagnosis Date  . Anemia   . Anxiety   . Chronic combined systolic and diastolic CHF (congestive heart failure) (HCC)    a. EF 20-25% by echo in 08/2015 b. echo 10/2015: EF 35-40%, diffuse HK, severe LAE, moderate RAE, small pericardial effusion  . Complication of anesthesia    itching, sore throat  . Depression   . Dialysis patient ([place])   . ESRD (end stage renal disease) ([place])    due to HTN per patient, followed at [facility], s/p failed kidney transplant - dialysis Tue, Th, Sat  . Hyperkalemia 12/2015  . Hypertension   . Junctional rhythm    a. noted in 08/2015: hyperkalemic at that time  b. 12/2015: presented in junctional rhythm w/ K+ of 6.6. Resolved with improvement of K+ levels.  . Nonischemic cardiomyopathy ([place])    a. 08/2014: cath showing minimal CAD, but tortuous arteries noted.   . Personal history of DVT (deep vein thrombosis)/ PE 05/26/2016   In Oct 2015 had small subsemental LUL PE w/o DVT (LE  dopplers neg) and was felt to be HD cath related, treated w coumadin.  IN May 2016 had small vein DVT (acute/subacute) in the R basilic/ brachial veins of the RUE, resumed on coumadin.  Had R sided HD cath at that time.    . Renal insufficiency   . Shortness of breath   . Type II diabetes mellitus (HCC)    No history per patient, but remains under history as A1c would not be accurate given on dialysis    Assessment: 43 YOM with ESRD with no home HD unit who comes to dialyze frequently at [facility] and [place] was asked to resume warfarin from PTA for hx PE/DVT.  Per review of the notes - VTE episodes appear to be related to HD cath and per renal to consider stopping all anticoagulation once [facility] out and AVF matured. The patient is clearly noncompliant outpatient as the INR is always near baseline.   Admit INR today is SUBtherapeutic (INR 1.01, goal of 2-3). Hgb 10.5, plts wnl - noted history of anemia due to CKD. No bleeding noted.   Goal of Therapy:  Heparin level 0.3-0.7 units/ml   Plan:  1. Warfarin 10 mg x 1 dose at 1800 today 2. Daily PT/INR 3. Will continue to monitor for any signs/symptoms of bleeding and will follow up with  PT/INR in the a.m.   Thank you for allowing pharmacy to be a part of this patient's care.  Alycia Rossetti, PharmD, BCPS Clinical Pharmacist Pager: [phone] Clinical phone for 06/17/2016 from 7a-3:30p: [phone] If after 3:30p, please call main pharmacy at: [phone] 06/17/2016 2:15 PM

## 2016-06-17 NOTE — ED Triage Notes (Signed)
PT states he is here for dialysis.  Reports lower back pain x 1 week and vomiting x 3 today.

## 2016-06-17 NOTE — Procedures (Signed)
  I was present at this dialysis session, have reviewed the session itself and made  appropriate changes Kelly Splinter MD [place] pager [phone]   06/17/2016, 10:27 AM

## 2016-06-17 NOTE — ED Provider Notes (Signed)
[facility] DEPT Provider Note   CSN: [id number] Arrival date & time: 06/16/16  2325    History   Chief Complaint Chief Complaint  Patient presents with  . Emesis  . Back Pain  . dialysis    HPI Frank [name] is a 50 y.o. male.  50 year old male with a history of end-stage renal disease, on dialysis, CHF, hypertension, and diabetes mellitus presents to the emergency department for dialysis. He states that he no longer has a dialysis center. When asked why he replies, "it's a long story". Patient reports vomiting today. He has had no chest pain or shortness of breath. He does have a snack at the bedside which are open; he appears to be tolerating this okay. Patient denies recent fevers. He complains of some mild back pain which he attributes to a renal cyst. Potassium noted to be >7 on arrival today. Patient last dialyzed on 06/10/16; he presented to the ED on 06/14/16, but was unable to be dialyzed on this date.     Past Medical History:  Diagnosis Date  . Anemia   . Anxiety   . Chronic combined systolic and diastolic CHF (congestive heart failure) (HCC)    a. EF 20-25% by echo in 08/2015 b. echo 10/2015: EF 35-40%, diffuse HK, severe LAE, moderate RAE, small pericardial effusion  . Complication of anesthesia    itching, sore throat  . Depression   . Dialysis patient ([place])   . ESRD (end stage renal disease) ([place])    due to HTN per patient, followed at [facility], s/p failed kidney transplant - dialysis Tue, Th, Sat  . Hyperkalemia 12/2015  . Hypertension   . Junctional rhythm    a. noted in 08/2015: hyperkalemic at that time  b. 12/2015: presented in junctional rhythm w/ K+ of 6.6. Resolved with improvement of K+ levels.  . Nonischemic cardiomyopathy ([place])    a. 08/2014: cath showing minimal CAD, but tortuous arteries noted.   . Personal history of DVT (deep vein thrombosis)/ PE 05/26/2016   In Oct 2015 had small subsemental LUL PE w/o DVT (LE dopplers neg) and was felt to be  HD cath related, treated w coumadin.  IN May 2016 had small vein DVT (acute/subacute) in the R basilic/ brachial veins of the RUE, resumed on coumadin.  Had R sided HD cath at that time.    . Renal insufficiency   . Shortness of breath   . Type II diabetes mellitus (HCC)    No history per patient, but remains under history as A1c would not be accurate given on dialysis    Patient Active Problem List   Diagnosis Date Noted  . ESRD needing dialysis ([place]) 06/14/2016  . Uremia 06/07/2016  . Hyperkalemia 05/29/2016  . Chronic back pain 05/29/2016  . GERD (gastroesophageal reflux disease) 05/29/2016  . Personal history of DVT (deep vein thrombosis)/ PE 05/26/2016  . Bradycardia 03/31/2016  . Nonischemic cardiomyopathy ([place]) 01/09/2016  . Bilateral low back pain without sciatica   . Left renal mass 10/30/2015  . Constipation 10/30/2015  . Adjustment disorder with mixed anxiety and depressed mood 08/20/2015  . Chronic epididymitis 06/19/2015  . Groin pain, chronic, right 06/19/2015  . Incarcerated right inguinal hernia 02/16/2015  . Essential hypertension 01/02/2015  . Dyslipidemia   . ESRD on hemodialysis ([place])   . Anemia of chronic kidney failure 06/24/2013    Past Surgical History:  Procedure Laterality Date  . CAPD INSERTION    . CAPD REMOVAL    .  INGUINAL HERNIA REPAIR Right 02/14/2015   Procedure: REPAIR INCARCERATED RIGHT INGUINAL HERNIA;  Surgeon: [name], MD;  Location: [facility];  Service: General;  Laterality: Right;  . INSERTION OF DIALYSIS CATHETER Right 09/23/2015   Procedure: exchange of Right internal Dialysis Catheter.;  Surgeon: [name], MD;  Location: [facility];  Service: Vascular;  Laterality: Right;  . KIDNEY RECEIPIENT  2006   failed and started HD in March 2014  . LEFT HEART CATHETERIZATION WITH CORONARY ANGIOGRAM N/A 09/02/2014   Procedure: LEFT HEART CATHETERIZATION WITH CORONARY ANGIOGRAM;  Surgeon: [name], MD;  Location: [facility] CATH LAB;  Service:  Cardiovascular;  Laterality: N/A;       Home Medications    Prior to Admission medications   Medication Sig Start Date End Date Taking? Authorizing Provider  allopurinol (ZYLOPRIM) 100 MG tablet Take 100 mg by mouth daily.    Historical Provider, MD  amLODipine (NORVASC) 10 MG tablet Take 10 mg by mouth daily.    Historical Provider, MD  atorvastatin (LIPITOR) 40 MG tablet Take 1 tablet (40 mg total) by mouth daily at 6 PM. 09/02/14   Barton Dubois, MD  carvedilol (COREG) 25 MG tablet Take 25 mg by mouth 2 (two) times daily with a meal.    Historical Provider, MD  cinacalcet (SENSIPAR) 30 MG tablet Take 30 mg by mouth daily.    Historical Provider, MD  cloNIDine (CATAPRES - DOSED IN MG/24 HR) 0.3 mg/24hr patch Place 0.3 mg onto the skin once a week.    Historical Provider, MD  doxazosin (CARDURA) 2 MG tablet Take 2 mg by mouth daily.    Historical Provider, MD  famotidine (PEPCID) 20 MG tablet Take 1 tablet (20 mg total) by mouth 2 (two) times daily. 02/13/16   Charlesetta Shanks, MD  gabapentin (NEURONTIN) 100 MG capsule Take 1 capsule (100 mg total) by mouth at bedtime. Patient taking differently: Take 100 mg by mouth 3 (three) times daily.  05/21/16   [name], DO  hydrALAZINE (APRESOLINE) 100 MG tablet Take 100 mg by mouth 2 (two) times daily.     Historical Provider, MD  isosorbide mononitrate (IMDUR) 60 MG 24 hr tablet Take 1 tablet (60 mg total) by mouth daily. 01/09/16   Smiley Houseman, MD  omeprazole (PRILOSEC) 20 MG capsule Take 20 mg by mouth daily. 07/01/13   Historical Provider, MD  ondansetron (ZOFRAN ODT) 4 MG disintegrating tablet Take 1 tablet (4 mg total) by mouth every 8 (eight) hours as needed for nausea or vomiting. 05/16/16   Sherwood Gambler, MD  oxycodone (OXY-IR) 5 MG capsule Take 1 capsule (5 mg total) by mouth every 4 (four) hours as needed for pain. 05/30/16   [name], MD  predniSONE (DELTASONE) 10 MG tablet Take 10 mg by mouth daily with breakfast.     Historical Provider, MD  sevelamer carbonate (RENVELA) 800 MG tablet Take 800-1,600 mg by mouth See admin instructions. Take 1600 mg by mouth 3 times daily with meals and take 800 mg by mouth with snacks.    Historical Provider, MD  warfarin (COUMADIN) 2 MG tablet Take 2 mg by mouth daily. Take with 33m tablet to equal 728m7/12/17   Historical Provider, MD  warfarin (COUMADIN) 5 MG tablet Take 5 mg by mouth daily. Take with 9m55mablet to equal 7mg34m12/17   Historical Provider, MD    Family History Family History  Problem Relation Age of Onset  . Hypertension Other  Social History Social History  Substance Use Topics  . Smoking status: Former Smoker    Packs/day: 0.00    Years: 1.00    Types: Cigarettes  . Smokeless tobacco: Never Used     Comment: quit Jan 2014  . Alcohol use No     Allergies   Butalbital-apap-caffeine; Ferrlecit [na ferric gluc cplx in sucrose]; Minoxidil; Darvocet [propoxyphene n-acetaminophen]; and Other   Review of Systems Review of Systems Ten systems reviewed and are negative for acute change, except as noted in the HPI.    Physical Exam Updated Vital Signs BP 172/95   Pulse 79   Temp 98.1 F (36.7 C) (Oral)   Resp 11   SpO2 98%   Physical Exam  Constitutional: He is oriented to person, place, and time. He appears well-developed and well-nourished. No distress.  Nontoxic appearing and in no distress  HENT:  Head: Normocephalic and atraumatic.  Eyes: Conjunctivae and EOM are normal. No scleral icterus.  Neck: Normal range of motion.  Cardiovascular: Normal rate, regular rhythm and intact distal pulses.   Pulmonary/Chest: Effort normal. No respiratory distress. He has no wheezes.  Respirations even and unlabored  Musculoskeletal: Normal range of motion. He exhibits tenderness.  Reproducible tenderness to palpation to the left flank. No appreciable spasm to the back. No bony deformities, step-offs, or crepitus to the lumbar midline.    Neurological: He is alert and oriented to person, place, and time. He exhibits normal muscle tone. Coordination normal.  GCS 15. Patient moving all extremities.  Skin: Skin is warm and dry. No rash noted. He is not diaphoretic. No erythema. No pallor.  Psychiatric: He has a normal mood and affect. His behavior is normal.  Nursing note and vitals reviewed.    ED Treatments / Results  Labs (all labs ordered are listed, but only abnormal results are displayed) Labs Reviewed  COMPREHENSIVE METABOLIC PANEL - Abnormal; Notable for the following:       Result Value   Potassium >7.5 (*)    BUN 65 (*)    Creatinine, Ser 17.15 (*)    Calcium 8.1 (*)    Albumin 3.4 (*)    ALT 9 (*)    Alkaline Phosphatase 159 (*)    GFR calc non Af Amer 3 (*)    GFR calc Af Amer 3 (*)    All other components within normal limits  CBC - Abnormal; Notable for the following:    RBC 3.34 (*)    Hemoglobin 9.5 (*)    HCT 29.9 (*)    RDW 16.8 (*)    All other components within normal limits  POTASSIUM - Abnormal; Notable for the following:    Potassium 7.1 (*)    All other components within normal limits  CBG MONITORING, ED - Abnormal; Notable for the following:    Glucose-Capillary 101 (*)    All other components within normal limits  LIPASE, BLOOD  URINALYSIS, ROUTINE W REFLEX MICROSCOPIC (NOT AT [facility])  CBG MONITORING, ED    EKG  EKG Interpretation  Date/Time:  Monday June 17 2016 02:38:06 EST Ventricular Rate:  61 PR Interval:  202 QRS Duration: 94 QT Interval:  494 QTC Calculation: 497 R Axis:   -60 Text Interpretation:  Normal sinus rhythm Left axis deviation Left ventricular hypertrophy with repolarization abnormality Prolonged QT Abnormal ECG No significant change since last tracing Confirmed by WARD,  DO, KRISTEN ([id number]) on 06/17/2016 2:44:20 AM       Radiology No results found.  Procedures Procedures (including critical care time)  Medications Ordered in ED Medications   calcium gluconate 1 g in sodium chloride 0.9 % 100 mL IVPB (not administered)  ondansetron (ZOFRAN) injection 4 mg (not administered)  dextrose 50 % solution 50 mL (50 mLs Intravenous Given 06/17/16 0333)  insulin aspart (novoLOG) injection 10 Units (10 Units Intravenous Given 06/17/16 0333)  albuterol (PROVENTIL) (2.5 MG/3ML) 0.083% nebulizer solution 10 mg (10 mg Nebulization Given 06/17/16 0333)  fentaNYL (SUBLIMAZE) injection 50 mcg (50 mcg Intravenous Given 06/17/16 0348)  dextrose 10 % infusion ( Intravenous New Bag/Given 06/17/16 0432)    CRITICAL CARE Performed by: [name]   Total critical care time: 35 minutes  Critical care time was exclusive of separately billable procedures and treating other patients.  Critical care was necessary to treat or prevent imminent or life-threatening deterioration.  Critical care was time spent personally by me on the following activities: development of treatment plan with patient and/or surrogate as well as nursing, discussions with consultants, evaluation of patient's response to treatment, examination of patient, obtaining history from patient or surrogate, ordering and performing treatments and interventions, ordering and review of laboratory studies, ordering and review of radiographic studies, pulse oximetry and re-evaluation of patient's condition.   Initial Impression / Assessment and Plan / ED Course  I have reviewed the triage vital signs and the nursing notes.  Pertinent labs & imaging results that were available during my care of the patient were reviewed by me and considered in my medical decision making (see chart for details).  Clinical Course     3:28 AM Dr. Moshe Cipro of nephrology notified of patient's presence in the ED as well as his elevated creatinine and K+ of 7.1. No EKG changes. Plan to proceed with albuterol, insulin, and dextrose. Calcium gluconate ordered. Patient otherwise stable. He will require dialysis  in the AM.  4:41 AM Plan to admit to ensure improving K+. Orders placed by nephrology for AM dialysis.  5:01 AM Dr. Hal Hope to admit under observation. Temporary admission orders placed.   Final Clinical Impressions(s) / ED Diagnoses   Final diagnoses:  Hyperkalemia  ESRD needing dialysis ([place])  Hypertension not at goal    New Prescriptions New Prescriptions   No medications on file     [name], PA-C 06/17/16 0502    [name], PA-C 06/17/16 0503

## 2016-06-17 NOTE — H&P (Signed)
History and Physical    Frank [name]:[id number] DOB: [birth year] DOA: 06/17/2016  PCP: Angelica Chessman, MD  Patient coming from: Home.  Chief Complaint: Nausea vomiting.  HPI: Frank [name] is a 50 y.o. male with ESRD on hemodialysis, nonischemic cardiomyopathy, hypertension, chronic anemia and history of PE presents to the ER because of nausea vomiting over the last 2 days. Patient has missed his dialysis over the last 1 week. Potassium in the ER was more than 7. Patient was given albuterol nebulizer with IV insulin and D50 and calcium gluconate. On-call nephrologist was consulted and patient is presently receiving urgent dialysis. On exam patient's abdomen appears benign and nausea vomiting most likely from uremia. Patient denies any diarrhea chest pain or shortness of breath.   ED Course: Calcium gluconate D50 IV insulin and albuterol nebulizer was given for hyperkalemia and nephrology consulted for urgent dialysis.  Review of Systems: As per HPI, rest all negative.   Past Medical History:  Diagnosis Date  . Anemia   . Anxiety   . Chronic combined systolic and diastolic CHF (congestive heart failure) (HCC)    a. EF 20-25% by echo in 08/2015 b. echo 10/2015: EF 35-40%, diffuse HK, severe LAE, moderate RAE, small pericardial effusion  . Complication of anesthesia    itching, sore throat  . Depression   . Dialysis patient ([place])   . ESRD (end stage renal disease) ([place])    due to HTN per patient, followed at [facility], s/p failed kidney transplant - dialysis Tue, Th, Sat  . Hyperkalemia 12/2015  . Hypertension   . Junctional rhythm    a. noted in 08/2015: hyperkalemic at that time  b. 12/2015: presented in junctional rhythm w/ K+ of 6.6. Resolved with improvement of K+ levels.  . Nonischemic cardiomyopathy ([place])    a. 08/2014: cath showing minimal CAD, but tortuous arteries noted.   . Personal history of DVT (deep vein thrombosis)/ PE 05/26/2016   In Oct 2015 had small subsemental  LUL PE w/o DVT (LE dopplers neg) and was felt to be HD cath related, treated w coumadin.  IN May 2016 had small vein DVT (acute/subacute) in the R basilic/ brachial veins of the RUE, resumed on coumadin.  Had R sided HD cath at that time.    . Renal insufficiency   . Shortness of breath   . Type II diabetes mellitus (HCC)    No history per patient, but remains under history as A1c would not be accurate given on dialysis    Past Surgical History:  Procedure Laterality Date  . CAPD INSERTION    . CAPD REMOVAL    . INGUINAL HERNIA REPAIR Right 02/14/2015   Procedure: REPAIR INCARCERATED RIGHT INGUINAL HERNIA;  Surgeon: [name], MD;  Location: [facility];  Service: General;  Laterality: Right;  . INSERTION OF DIALYSIS CATHETER Right 09/23/2015   Procedure: exchange of Right internal Dialysis Catheter.;  Surgeon: [name], MD;  Location: [place];  Service: Vascular;  Laterality: Right;  . KIDNEY RECEIPIENT  2006   failed and started HD in March 2014  . LEFT HEART CATHETERIZATION WITH CORONARY ANGIOGRAM N/A 09/02/2014   Procedure: LEFT HEART CATHETERIZATION WITH CORONARY ANGIOGRAM;  Surgeon: [name], MD;  Location: [facility] CATH LAB;  Service: Cardiovascular;  Laterality: N/A;     reports that he has quit smoking. His smoking use included Cigarettes. He smoked 0.00 packs per day for 1.00 year. He has never used smokeless tobacco. He reports that  he does not drink alcohol or use drugs.  Allergies  Allergen Reactions  . Butalbital-Apap-Caffeine Shortness Of Breath, Swelling and Other (See Comments)    Swelling in throat  . Ferrlecit [Na Ferric Gluc Cplx In Sucrose] Shortness Of Breath, Swelling and Other (See Comments)    Swelling in throat  . Minoxidil Shortness Of Breath  . Darvocet [Propoxyphene N-Acetaminophen] Hives  . Other Hives and Other (See Comments)    Allergy listed on file from [place] (Pt did not mention any allergies) Pt able to do norco (tylenol)    Family History    Problem Relation Age of Onset  . Hypertension Other     Prior to Admission medications   Medication Sig Start Date End Date Taking? Authorizing Provider  allopurinol (ZYLOPRIM) 100 MG tablet Take 100 mg by mouth daily.    Historical Provider, MD  amLODipine (NORVASC) 10 MG tablet Take 10 mg by mouth daily.    Historical Provider, MD  atorvastatin (LIPITOR) 40 MG tablet Take 1 tablet (40 mg total) by mouth daily at 6 PM. 09/02/14   Barton Dubois, MD  carvedilol (COREG) 25 MG tablet Take 25 mg by mouth 2 (two) times daily with a meal.    Historical Provider, MD  cinacalcet (SENSIPAR) 30 MG tablet Take 30 mg by mouth daily.    Historical Provider, MD  cloNIDine (CATAPRES - DOSED IN MG/24 HR) 0.3 mg/24hr patch Place 0.3 mg onto the skin once a week.    Historical Provider, MD  doxazosin (CARDURA) 2 MG tablet Take 2 mg by mouth daily.    Historical Provider, MD  famotidine (PEPCID) 20 MG tablet Take 1 tablet (20 mg total) by mouth 2 (two) times daily. 02/13/16   Charlesetta Shanks, MD  gabapentin (NEURONTIN) 100 MG capsule Take 1 capsule (100 mg total) by mouth at bedtime. Patient taking differently: Take 100 mg by mouth 3 (three) times daily.  05/21/16   [name], DO  hydrALAZINE (APRESOLINE) 100 MG tablet Take 100 mg by mouth 2 (two) times daily.     Historical Provider, MD  isosorbide mononitrate (IMDUR) 60 MG 24 hr tablet Take 1 tablet (60 mg total) by mouth daily. 01/09/16   Smiley Houseman, MD  omeprazole (PRILOSEC) 20 MG capsule Take 20 mg by mouth daily. 07/01/13   Historical Provider, MD  ondansetron (ZOFRAN ODT) 4 MG disintegrating tablet Take 1 tablet (4 mg total) by mouth every 8 (eight) hours as needed for nausea or vomiting. 05/16/16   Sherwood Gambler, MD  oxycodone (OXY-IR) 5 MG capsule Take 1 capsule (5 mg total) by mouth every 4 (four) hours as needed for pain. 05/30/16   [name], MD  predniSONE (DELTASONE) 10 MG tablet Take 10 mg by mouth daily with breakfast.    Historical  Provider, MD  sevelamer carbonate (RENVELA) 800 MG tablet Take 800-1,600 mg by mouth See admin instructions. Take 1600 mg by mouth 3 times daily with meals and take 800 mg by mouth with snacks.    Historical Provider, MD  warfarin (COUMADIN) 2 MG tablet Take 2 mg by mouth daily. Take with 71m tablet to equal 758m7/12/17   Historical Provider, MD  warfarin (COUMADIN) 5 MG tablet Take 5 mg by mouth daily. Take with 23m60mablet to equal 7mg54m12/17   Historical Provider, MD    Physical Exam: Vitals:   06/17/16 0445 06/17/16 0515 06/17/16 0557 06/17/16 0618  BP: (!) 176/106 (!) 179/103 (!) 200/119 (!) 193/120  Pulse: 80 82 86 88  Resp:   18 16  Temp:   97.8 F (36.6 C)   TempSrc:   Oral   SpO2: 98% 97% 97%   Weight:   78.5 kg (173 lb 1 oz)       Constitutional: Moderately built and nourished. Vitals:   06/17/16 0445 06/17/16 0515 06/17/16 0557 06/17/16 0618  BP: (!) 176/106 (!) 179/103 (!) 200/119 (!) 193/120  Pulse: 80 82 86 88  Resp:   18 16  Temp:   97.8 F (36.6 C)   TempSrc:   Oral   SpO2: 98% 97% 97%   Weight:   78.5 kg (173 lb 1 oz)    Eyes: Anicteric mild pallor. ENMT: No discharge from the ears eyes nose or mouth. Neck: No JVD appreciated. No mass felt. Respiratory: No rhonchi or crepitations. Cardiovascular: S1-S2 heard. Abdomen: Soft nontender bowel sounds present. No guarding or rigidity. Musculoskeletal: No edema. Skin: No rash. Neurologic: Alert awake oriented to time place and person. Moves all extremities. Psychiatric: Appears normal.   Labs on Admission: I have personally reviewed following labs and imaging studies  CBC:  Recent Labs Lab 06/17/16 0022  WBC 4.2  HGB 9.5*  HCT 29.9*  MCV 89.5  PLT 629   Basic Metabolic Panel:  Recent Labs Lab 06/17/16 0022 06/17/16 0235  NA 139  --   K >7.5* 7.1*  CL 102  --   CO2 23  --   GLUCOSE 70  --   BUN 65*  --   CREATININE 17.15*  --   CALCIUM 8.1*  --    GFR: Estimated Creatinine Clearance:  5.6 mL/min (by C-G formula based on SCr of 17.15 mg/dL (H)). Liver Function Tests:  Recent Labs Lab 06/17/16 0022  AST 17  ALT 9*  ALKPHOS 159*  BILITOT 0.6  PROT 7.5  ALBUMIN 3.4*    Recent Labs Lab 06/17/16 0022  LIPASE 39   No results for input(s): AMMONIA in the last 168 hours. Coagulation Profile: No results for input(s): INR, PROTIME in the last 168 hours. Cardiac Enzymes: No results for input(s): CKTOTAL, CKMB, CKMBINDEX, TROPONINI in the last 168 hours. BNP (last 3 results) No results for input(s): PROBNP in the last 8760 hours. HbA1C: No results for input(s): HGBA1C in the last 72 hours. CBG:  Recent Labs Lab 06/17/16 0338 06/17/16 0445  GLUCAP 101* 70   Lipid Profile: No results for input(s): CHOL, HDL, LDLCALC, TRIG, CHOLHDL, LDLDIRECT in the last 72 hours. Thyroid Function Tests: No results for input(s): TSH, T4TOTAL, FREET4, T3FREE, THYROIDAB in the last 72 hours. Anemia Panel: No results for input(s): VITAMINB12, FOLATE, FERRITIN, TIBC, IRON, RETICCTPCT in the last 72 hours. Urine analysis:    Component Value Date/Time   COLORURINE YELLOW 10/18/2013 0419   APPEARANCEUR CLEAR 10/18/2013 0419   LABSPEC 1.008 10/18/2013 0419   PHURINE 8.5 (H) 10/18/2013 0419   GLUCOSEU 100 (A) 10/18/2013 0419   HGBUR TRACE (A) 10/18/2013 0419   BILIRUBINUR NEGATIVE 10/18/2013 0419   KETONESUR NEGATIVE 10/18/2013 0419   PROTEINUR 100 (A) 10/18/2013 0419   UROBILINOGEN 0.2 10/18/2013 0419   NITRITE NEGATIVE 10/18/2013 0419   LEUKOCYTESUR NEGATIVE 10/18/2013 0419   Sepsis Labs: _0 (procalcitonin:4,lacticidven:4) )No results found for this or any previous visit (from the past 240 hour(s)).   Radiological Exams on Admission: No results found.  EKG: Independently reviewed. Normal sinus rhythm with a prolonged QT.  Assessment/Plan Active Problems:   Hyperkalemia    1. Severe hyperkalemia  in an ESRD patient with missed dialysis - patient advised to be  compliant with dialysis. At this time patient is receiving urgent dialysis per nephrology. Follow metabolic panel after dialysis. EKG shows prolonged QT which I think will improve with correction of potassium. 2. Nausea vomiting probably from uremia - denies any abdominal pain and abdomen appears benign on exam. Closely observe. 3. History of PE on Coumadin - Coumadin per pharmacy. Patient states over the last 2 days he was not able to take his Coumadin. May need to bridge him with Lovenox if INR is subtherapeutic. 4. Hypertension uncontrolled - probably secondary to nausea vomiting and patient's noncompliance. I think patient's blood pressure will improve with dialysis and restarting his home antihypertensives. Closely follow blood pressure trends. 5. Nonischemic cardiomyopathy last EF measured in April 2017 was 35-40% - volume management per nephrology. 6. Chronic anemia - follow CBC.   DVT prophylaxis: Coumadin. Code Status: Full code.  Family Communication: Discussed with patient.  Disposition Plan: Home.  Consults called: Nephrology.  Admission status: Observation.    [name] MD [facility] Pager [phone].  If 7PM-7AM, please contact night-coverage [email or web address] Password [facility]  06/17/2016, 6:52 AM

## 2016-06-17 NOTE — ED Notes (Signed)
Repeat Potassium per Dr. Dina Rich

## 2016-06-17 NOTE — Progress Notes (Signed)
CCMD called because all leads were off; patient is in the bathroom and refuses to have leads checked. Patient on standby at this time.

## 2016-06-17 NOTE — ED Notes (Addendum)
Pt is eating a sandwich in the waiting room and keeps telling everyone to wait until he finishes eating to do blood work and EKG. Pt encouraged to come back into triage area for EKG and repeat labs.

## 2016-06-17 NOTE — Progress Notes (Signed)
Late entry: Pt is refusing to have vitals taken.  Eleanora Neighbor, RN

## 2016-06-17 NOTE — ED Notes (Signed)
Pt presents to ED with 2 day history of vomiting.  Last dialysis on Thursday.  Pt also endorses mid back pain, rated 8/10.

## 2016-06-17 NOTE — Progress Notes (Signed)
PROGRESS NOTE    Frank [name]  TWK:[id number] DOB: 09/28/1965 DOA: 06/17/2016 PCP: Angelica Chessman, MD   Subjective: Patient is frustrated with process of receiving HD. He states he has difficult with HD access and has a catheter in place. Otherwise, he is doing well.  Brief Narrative:  Frank [name] is a 50 y.o. male with ESRD on hemodialysis, renal transplant, nonischemic cardiomyopathy, hypertension, chronic anemia and history of PE presented to the ER for vomiting x 2 days. He was receiving inpatient HD for the past 1-2 months either at [facility] or at [facility]. Patient has missed his dialysis over the last 1 week. Potassium in the ER was more than 7. Patient was given albuterol nebulizer with IV insulin and D50 and calcium gluconate. Patient received urgent dialysis.     Assessment & Plan:   Principal Problem:   Hyperkalemia Active Problems:   Anemia of chronic kidney failure   Nonischemic cardiomyopathy ([place])   Uremia   ESRD needing dialysis ([place])   Severe hyperkalemia  - Potassium is >7.5 on 11/27, slowly improving - ESRD patient with missed dialysis - Patient received urgent dialysis per nephrology.  - Repeat metabolic panel after dialysis showed potassium of 4.7. - EKG shows prolonged QT. Follow clinically.  Nausea, vomiting  - Denies any abdominal pain and abdomen appears benign on exam.  - Will monitor  ESRD - Patient has no outpatient HD center. He missed over one week dialysis.  - He is dependent on hospital HD units for dialysis needs - Nephrology is following - HD today and again tomorrow per nephrology  History of PE/DVT - On Coumadin, his INR is 1.01 - Patient states over the last 2 days he was not able to take his Coumadin. Consider bridging with Lovenox if INR is subtherapeutic. -Pharmacy to start Lovenox  Uncontrolled hypertension  - Possibly secondary to nausea, vomiting and patient's noncompliance. - Continue norvasc, clonidine, and carvedilol - Will  monitor  Nonischemic cardiomyopathy  - Last EF measured in April 2017 was 35-40%  - Volume management per nephrology.  Chronic anemia  - Hb 9.5 on 11/27 - Received darbepoetin 100ug today - follow CBC   DVT prophylaxis: coumadin Code Status: Full code Family Communication: None at bedside Disposition Plan: Remain inpatient   Consultants:   Nephrology  Procedures:   HD on 11/27  Antimicrobials:  None      Objective: Vitals:   06/17/16 0830 06/17/16 0900 06/17/16 0930 06/17/16 1000  BP: (!) 171/111 159/100 (!) 180/123 (!) 164/106  Pulse: 84 80 77 83  Resp:      Temp:      TempSrc:      SpO2:      Weight:       No intake or output data in the 24 hours ending 06/17/16 1050 Filed Weights   06/17/16 0557  Weight: 78.5 kg (173 lb 1 oz)    Examination:  General exam: Irritated but well-appearing. No acute distress  Respiratory system: Clear to auscultation. Respiratory effort normal. Cardiovascular system: S1 & S2 heard, RRR. No JVD, murmurs, rubs, gallops or clicks. No pedal edema. Gastrointestinal system: Abdomen is nondistended, soft and nontender. No organomegaly or masses felt. Normal bowel sounds heard. Central nervous system: Alert and oriented. No focal neurological deficits. Psychiatry: Judgement and insight appear normal. Mood & affect appropriate.     Data Reviewed: I have personally reviewed following labs and imaging studies  CBC:  Recent Labs Lab 06/17/16 0022 06/17/16 0738  WBC  4.2  --   HGB 9.5* 10.5*  HCT 29.9* 31.0*  MCV 89.5  --   PLT 240  --    Basic Metabolic Panel:  Recent Labs Lab 06/17/16 0022 06/17/16 0235 06/17/16 0738  NA 139  --  139  K >7.5* 7.1* 4.7  CL 102  --  99*  CO2 23  --   --   GLUCOSE 70  --  109*  BUN 65*  --  41*  CREATININE 17.15*  --  10.20*  CALCIUM 8.1*  --   --    GFR: Estimated Creatinine Clearance: 9.4 mL/min (by C-G formula based on SCr of 10.2 mg/dL (H)). Liver Function  Tests:  Recent Labs Lab 06/17/16 0022  AST 17  ALT 9*  ALKPHOS 159*  BILITOT 0.6  PROT 7.5  ALBUMIN 3.4*    Recent Labs Lab 06/17/16 0022  LIPASE 39   No results for input(s): AMMONIA in the last 168 hours. Coagulation Profile: No results for input(s): INR, PROTIME in the last 168 hours. Cardiac Enzymes: No results for input(s): CKTOTAL, CKMB, CKMBINDEX, TROPONINI in the last 168 hours. BNP (last 3 results) No results for input(s): PROBNP in the last 8760 hours. HbA1C: No results for input(s): HGBA1C in the last 72 hours. CBG:  Recent Labs Lab 06/17/16 0338 06/17/16 0445  GLUCAP 101* 70   Lipid Profile: No results for input(s): CHOL, HDL, LDLCALC, TRIG, CHOLHDL, LDLDIRECT in the last 72 hours. Thyroid Function Tests: No results for input(s): TSH, T4TOTAL, FREET4, T3FREE, THYROIDAB in the last 72 hours. Anemia Panel: No results for input(s): VITAMINB12, FOLATE, FERRITIN, TIBC, IRON, RETICCTPCT in the last 72 hours. Sepsis Labs: No results for input(s): PROCALCITON, LATICACIDVEN in the last 168 hours.  No results found for this or any previous visit (from the past 240 hour(s)).       Radiology Studies: No results found.      Scheduled Meds: . allopurinol  100 mg Oral Daily  . amLODipine  10 mg Oral Daily  . atorvastatin  40 mg Oral q1800  . carvedilol  25 mg Oral BID WC  . cinacalcet  30 mg Oral Daily  . cloNIDine  0.3 mg Transdermal Weekly  . darbepoetin (ARANESP) injection - DIALYSIS  100 mcg Intravenous Once  . doxazosin  2 mg Oral Daily  . famotidine  20 mg Oral BID  . gabapentin  100 mg Oral TID  . hydrALAZINE  100 mg Oral BID  . isosorbide mononitrate  60 mg Oral Daily  . pantoprazole  40 mg Oral Daily  . predniSONE  10 mg Oral Q breakfast  . sevelamer carbonate  800-1,600 mg Oral See admin instructions   Continuous Infusions:   LOS: 0 days    Time spent: [place], PA-S wrote some of this note [facility] Pager [phone]  If 7PM-7AM, please contact night-coverage [email or web address] Password [email or web address] 06/17/2016, 10:50 AM    Birdie Hopes Pager: [phone] 06/17/2016, 1:48 PM

## 2016-06-18 ENCOUNTER — Emergency Department (HOSPITAL_COMMUNITY)
Admission: EM | Admit: 2016-06-18 | Discharge: 2016-06-18 | Disposition: A | Discharge status: Home / Self Care | Payer: Medicare Other | Attending: Emergency Medicine | Admitting: Emergency Medicine

## 2016-06-18 ENCOUNTER — Encounter (HOSPITAL_COMMUNITY): Payer: Self-pay | Admitting: *Deleted

## 2016-06-18 DIAGNOSIS — Z87891 Personal history of nicotine dependence: Secondary | ICD-10-CM | POA: Insufficient documentation

## 2016-06-18 DIAGNOSIS — N185 Chronic kidney disease, stage 5: Secondary | ICD-10-CM

## 2016-06-18 DIAGNOSIS — E1122 Type 2 diabetes mellitus with diabetic chronic kidney disease: Secondary | ICD-10-CM | POA: Insufficient documentation

## 2016-06-18 DIAGNOSIS — R110 Nausea: Secondary | ICD-10-CM | POA: Insufficient documentation

## 2016-06-18 DIAGNOSIS — R55 Syncope and collapse: Secondary | ICD-10-CM | POA: Diagnosis not present

## 2016-06-18 DIAGNOSIS — I5042 Chronic combined systolic (congestive) and diastolic (congestive) heart failure: Secondary | ICD-10-CM | POA: Diagnosis not present

## 2016-06-18 DIAGNOSIS — I132 Hypertensive heart and chronic kidney disease with heart failure and with stage 5 chronic kidney disease, or end stage renal disease: Secondary | ICD-10-CM | POA: Insufficient documentation

## 2016-06-18 DIAGNOSIS — Z7901 Long term (current) use of anticoagulants: Secondary | ICD-10-CM | POA: Insufficient documentation

## 2016-06-18 DIAGNOSIS — N186 End stage renal disease: Secondary | ICD-10-CM | POA: Diagnosis not present

## 2016-06-18 DIAGNOSIS — I428 Other cardiomyopathies: Secondary | ICD-10-CM

## 2016-06-18 DIAGNOSIS — Z5321 Procedure and treatment not carried out due to patient leaving prior to being seen by health care provider: Secondary | ICD-10-CM | POA: Insufficient documentation

## 2016-06-18 DIAGNOSIS — D631 Anemia in chronic kidney disease: Secondary | ICD-10-CM

## 2016-06-18 LAB — CBC
HCT: 35.2 % — ABNORMAL LOW (ref 39.0–52.0)
HEMATOCRIT: 31.8 % — AB (ref 39.0–52.0)
Hemoglobin: 10 g/dL — ABNORMAL LOW (ref 13.0–17.0)
Hemoglobin: 11.2 g/dL — ABNORMAL LOW (ref 13.0–17.0)
MCH: 28.2 pg (ref 26.0–34.0)
MCH: 28.7 pg (ref 26.0–34.0)
MCHC: 31.4 g/dL (ref 30.0–36.0)
MCHC: 31.8 g/dL (ref 30.0–36.0)
MCV: 89.6 fL (ref 78.0–100.0)
MCV: 90.3 fL (ref 78.0–100.0)
PLATELETS: 190 10*3/uL (ref 150–400)
Platelets: 172 10*3/uL (ref 150–400)
RBC: 3.55 MIL/uL — AB (ref 4.22–5.81)
RBC: 3.9 MIL/uL — ABNORMAL LOW (ref 4.22–5.81)
RDW: 17 % — ABNORMAL HIGH (ref 11.5–15.5)
RDW: 16.7 % — ABNORMAL HIGH (ref 11.5–15.5)
WBC: 3.1 10*3/uL — AB (ref 4.0–10.5)
WBC: 3.5 10*3/uL — ABNORMAL LOW (ref 4.0–10.5)

## 2016-06-18 LAB — BASIC METABOLIC PANEL
Anion gap: 11 (ref 5–15)
Anion gap: 12 (ref 5–15)
BUN: 27 mg/dL — ABNORMAL HIGH (ref 6–20)
BUN: 14 mg/dL (ref 6–20)
CHLORIDE: 101 mmol/L (ref 101–111)
CO2: 24 mmol/L (ref 22–32)
CO2: 26 mmol/L (ref 22–32)
Calcium: 8.8 mg/dL — ABNORMAL LOW (ref 8.9–10.3)
Calcium: 8.7 mg/dL — ABNORMAL LOW (ref 8.9–10.3)
Chloride: 96 mmol/L — ABNORMAL LOW (ref 101–111)
Creatinine, Ser: 9.42 mg/dL — ABNORMAL HIGH (ref 0.61–1.24)
Creatinine, Ser: 6.27 mg/dL — ABNORMAL HIGH (ref 0.61–1.24)
GFR calc Af Amer: 11 mL/min — ABNORMAL LOW (ref >60)
GFR calc non Af Amer: 6 mL/min — ABNORMAL LOW (ref >60)
GFR calc non Af Amer: 9 mL/min — ABNORMAL LOW (ref >60)
GFR, EST AFRICAN AMERICAN: 7 mL/min — AB (ref >60)
Glucose, Bld: 86 mg/dL (ref 65–99)
Glucose, Bld: 76 mg/dL (ref 65–99)
POTASSIUM: 5.7 mmol/L — AB (ref 3.5–5.1)
Potassium: 4.5 mmol/L (ref 3.5–5.1)
SODIUM: 136 mmol/L (ref 135–145)
Sodium: 134 mmol/L — ABNORMAL LOW (ref 135–145)

## 2016-06-18 LAB — PROTIME-INR
INR: 1.11
Prothrombin Time: 14.3 seconds (ref 11.4–15.2)

## 2016-06-18 LAB — I-STAT TROPONIN, ED: Troponin i, poc: 0.02 ng/mL (ref 0.00–0.08)

## 2016-06-18 LAB — HEPATITIS B SURFACE ANTIGEN: Hepatitis B Surface Ag: NEGATIVE

## 2016-06-18 MED ORDER — OXYCODONE HCL 5 MG PO TABS
ORAL_TABLET | ORAL | Status: AC
  Filled 2016-06-18: qty 1

## 2016-06-18 MED ORDER — FAMOTIDINE 20 MG PO TABS: 20.0000 mg | ORAL_TABLET | Freq: Every day | ORAL | Status: DC

## 2016-06-18 MED ORDER — WARFARIN SODIUM 10 MG PO TABS: 10.0000 mg | ORAL_TABLET | Freq: Once | ORAL | Status: DC

## 2016-06-18 NOTE — Progress Notes (Signed)
[name] Progress Note   Subjective: feeling a lot better, nausea resolved  Vitals:   06/17/16 1700 06/17/16 2136 06/18/16 0219 06/18/16 0851  BP: (!) 163/106 134/90  (!) 160/110  Pulse:    76  Resp:    16  Temp:    98.2 F (36.8 C)  TempSrc:    Oral  SpO2:    100%  Weight:   75.5 kg (166 lb 7.2 oz)     Inpatient medications: . allopurinol  100 mg Oral Daily  . amLODipine  10 mg Oral Daily  . atorvastatin  40 mg Oral q1800  . carvedilol  25 mg Oral BID WC  . cinacalcet  30 mg Oral Q supper  . cloNIDine  0.3 mg Transdermal Q Mon  . doxazosin  2 mg Oral Daily  . famotidine  20 mg Oral BID  . gabapentin  100 mg Oral TID  . hydrALAZINE  100 mg Oral BID  . isosorbide mononitrate  60 mg Oral Daily  . pantoprazole  40 mg Oral Daily  . predniSONE  10 mg Oral Q breakfast  . sevelamer carbonate  1,600 mg Oral TID WC  . warfarin  10 mg Oral ONCE-1800  . Warfarin - Pharmacist Dosing Inpatient   Does not apply q1800    ondansetron **OR** ondansetron (ZOFRAN) IV, ondansetron, oxyCODONE, sevelamer carbonate  Exam: Gen pale, pleasant, no distress No jvd or bruits Chest clear bilat RRR no MRG Abd soft ntnd no ascites Ext no LE or UE edema Neuro is alert, Ox 3 , nf LUA AVF maturing/ R IJ cath    Dialysis: patient has no OP HD unit. Dry wt "160 lbs" per pt.     Assessment: 1. Hyperkalemia - resolved 2. ESRD - HD again today.  Pt has no OP HD unit, pt is dependent on hospital HD unit(s) for his dialysis needs 3. Nausea/ vomiting - resolved 4. HTN on 5 bp meds 5. Hx DVT/ PE - see above, has had 2 episodes (1 LUL PE in 2015, other RUE brachial/ basilic thrombosis 8502 w/o PE), ostensibly could have all been related to his chronic indwelling HD cath. Would keep on coumadin while he has indwelling HD cath. If HD cath comes out at some point would consider dc coumadin.   6. NICM 7. Anemia - gave darbe 100 ug her (last dose 10 d ago), Hb 10.5, last tsat 22%,  refuses IV iron d/t allergy   Plan - HD again today.  Is stable for dc today from renal standpoint after HD today.    Kelly Splinter MD [facility] pager [phone]   06/18/2016, 10:24 AM    Recent Labs Lab 06/17/16 0022 06/17/16 0235 06/17/16 0738 06/18/16 0422  NA 139  --  139 136  K >7.5* 7.1* 4.7 5.7*  CL 102  --  99* 101  CO2 23  --   --  24  GLUCOSE 70  --  109* 86  BUN 65*  --  41* 27*  CREATININE 17.15*  --  10.20* 9.42*  CALCIUM 8.1*  --   --  8.8*    Recent Labs Lab 06/17/16 0022  AST 17  ALT 9*  ALKPHOS 159*  BILITOT 0.6  PROT 7.5  ALBUMIN 3.4*    Recent Labs Lab 06/17/16 0022 06/17/16 0738 06/18/16 0422  WBC 4.2  --  3.1*  HGB 9.5* 10.5* 10.0*  HCT 29.9* 31.0* 31.8*  MCV 89.5  --  89.6  PLT  240  --  190   Iron/TIBC/Ferritin/ %Sat    Component Value Date/Time   IRON 41 (L) 05/29/2016 1350   TIBC 190 (L) 05/29/2016 1350   FERRITIN 328 11/02/2015 0346   IRONPCTSAT 22 05/29/2016 1350

## 2016-06-18 NOTE — ED Triage Notes (Signed)
Ed from here and going to his brothers where he had a syncopal episode lasting 2-3 minutes. Pt states that he had nausea. Pt states that he had back pain that moved to his chest. Pt states that he had dialysis 3 hours ago.

## 2016-06-18 NOTE — Plan of Care (Signed)
Problem: Education: Goal: Knowledge of [facility] Education information/materials will improve Outcome: Not Met (add Reason) Pt is refusing care and vitals. He is irritable and is not willing to listen or cooperate very much.

## 2016-06-18 NOTE — ED Notes (Signed)
Pt asked for his BP to be recheced, which was 112/80, PT then returned to his seat. PT stated he was feeling better and said he was going home.

## 2016-06-18 NOTE — Progress Notes (Signed)
Patient Discharge: Disposition: Patient discharged to home. Education: Patient was in hurry and didn't let me explain or go over his discharge instructions.  He said he has been in and out of the hospital for HD.  IV: Discontinued IV before discharge. Telemetry: discontinued Tele, notified CCMD. Transportation: Patient refused to be escorted, and left the unit by himself.   Belongings: Patient took all his belonging with him.

## 2016-06-18 NOTE — Plan of Care (Signed)
Problem: Skin Integrity: Goal: Risk for impaired skin integrity will decrease Outcome: Not Met (add Reason) Pt refused skin  check

## 2016-06-18 NOTE — Discharge Summary (Signed)
Physician Discharge Summary  Frank [name] PXT:[id number] DOB: [birth year] DOA: 06/17/2016  PCP: Angelica Chessman, MD  Admit date: 06/17/2016 Discharge date: 06/18/2016  Admitted From: Home Disposition: Home  Recommendations for Outpatient Follow-up:  1. Follow up with PCP in 1-2 weeks 2. Please obtain BMP/CBC in one week  Home Health: NA Equipment/Devices:NA  Discharge Condition: Stable CODE STATUS: Full Code Diet recommendation: Diet renal with fluid restriction Fluid restriction: 1200 mL Fluid; Room service appropriate? Yes; Fluid consistency: Thin Diet - low sodium heart healthy  Brief/Interim Summary: Frank Hamed Crowis a 50 y.o.malewith ESRD on hemodialysis, renal transplant, nonischemic cardiomyopathy, hypertension, chronic anemia and history of PE presented to the ER for vomiting x 2 days. He was receiving inpatient HD [facility] the past [facility] or at [facility]. Patient has missed his dialysis over the last 1 week. Potassium in the ER was more than 7. Patient was given albuterol nebulizer with IV insulin and D50 and calcium gluconate. Patient received urgent dialysis.   Discharge Diagnoses:  Principal Problem:   Hyperkalemia Active Problems:   Anemia of chronic kidney failure   Nonischemic cardiomyopathy ([place])   Uremia   ESRD needing dialysis (HCC)    Severe hyperkalemia - Potassium is >7.5 on 11/27, slowly improving - ESRD patient with missed dialysis - EKG shows prolonged QT. Follow clinically. - Potassium 5.7 today, he will have another dialysis prior to discharge.  Nausea, vomiting  - Denies any abdominal pain and abdomen appears benign on exam.  - This is resolved, was able to move detail breakfast discharge.   ESRD - Patient has no outpatient HD center. He missed over one week dialysis.  - He is dependent on hospital HD units for dialysis needs. Not able to set up outpatient dialysis. -Nephrology consulted received dialysis on 11/27 and today  11/28.  History of PE/DVT - On Coumadin, his INR is 1.01 - History of remote PE/DVT in 2015 and 2016, this is likely related to his dialysis catheter. -Per nephrology recommendations, Continue Coumadin as long as he has catheter.  Uncontrolled hypertension  - Possibly secondary to nausea, vomiting and patient's noncompliance. - Continue norvasc, clonidine, and carvedilol - This is improved.  Nonischemic cardiomyopathy - Last EF measured in April 2017 was 35-40%  - Volume management per nephrology.  Chronic anemia - Hb 9.5 on 11/27 - Received darbepoetin 100ug today - follow CBC   Discharge Instructions  Discharge Instructions    Diet - low sodium heart healthy    Complete by:  As directed    Increase activity slowly    Complete by:  As directed        Medication List    TAKE these medications   allopurinol 100 MG tablet Commonly known as:  ZYLOPRIM Take 100 mg by mouth daily.   amLODipine 10 MG tablet Commonly known as:  NORVASC Take 10 mg by mouth 2 (two) times daily.   atorvastatin 40 MG tablet Commonly known as:  LIPITOR Take 1 tablet (40 mg total) by mouth daily at 6 PM.   carvedilol 25 MG tablet Commonly known as:  COREG Take 25 mg by mouth 2 (two) times daily with a meal.   cinacalcet 30 MG tablet Commonly known as:  SENSIPAR Take 30 mg by mouth every evening.   cloNIDine 0.3 mg/24hr patch Commonly known as:  CATAPRES - Dosed in mg/24 hr Place 0.3 mg onto the skin once a week.   doxazosin 2 MG tablet Commonly known as:  CARDURA  Take 2 mg by mouth daily.   famotidine 20 MG tablet Commonly known as:  PEPCID Take 1 tablet (20 mg total) by mouth 2 (two) times daily.   gabapentin 100 MG capsule Commonly known as:  NEURONTIN Take 1 capsule (100 mg total) by mouth at bedtime. What changed:  when to take this   hydrALAZINE 100 MG tablet Commonly known as:  APRESOLINE Take 100 mg by mouth 2 (two) times daily.   isosorbide mononitrate 60  MG 24 hr tablet Commonly known as:  IMDUR Take 1 tablet (60 mg total) by mouth daily.   omeprazole 20 MG capsule Commonly known as:  PRILOSEC Take 20 mg by mouth daily.   ondansetron 4 MG disintegrating tablet Commonly known as:  ZOFRAN ODT Take 1 tablet (4 mg total) by mouth every 8 (eight) hours as needed for nausea or vomiting.   oxycodone 5 MG capsule Commonly known as:  OXY-IR Take 1 capsule (5 mg total) by mouth every 4 (four) hours as needed for pain.   oxyCODONE-acetaminophen 5-325 MG tablet Commonly known as:  PERCOCET/ROXICET Take 1 tablet by mouth every 6 (six) hours as needed for severe pain.   predniSONE 10 MG tablet Commonly known as:  DELTASONE Take 10 mg by mouth daily with breakfast.   sevelamer carbonate 800 MG tablet Commonly known as:  RENVELA Take 1,600 mg by mouth 3 (three) times daily with meals.   sulfamethoxazole-trimethoprim 400-80 MG tablet Commonly known as:  BACTRIM,SEPTRA Take 1 tablet by mouth 3 (three) times a week.   warfarin 6 MG tablet Commonly known as:  COUMADIN Take 6 mg by mouth See admin instructions. Take 1 tablet (6 mg) by mouth daily with 1 mg tablet for a 7 mg dose   warfarin 1 MG tablet Commonly known as:  COUMADIN Take 1 mg by mouth daily.   warfarin 2 MG tablet Commonly known as:  COUMADIN Take 2 mg by mouth daily. Take with 56m tablet to equal 761m  warfarin 5 MG tablet Commonly known as:  COUMADIN Take 5 mg by mouth See admin instructions. Take 1 tablet (5 mg) by mouth with a 2 mg tablet for a 7 mg dose       Allergies  Allergen Reactions  . Butalbital-Apap-Caffeine Shortness Of Breath, Swelling and Other (See Comments)    Swelling in throat  . Ferrlecit [Na Ferric Gluc Cplx In Sucrose] Shortness Of Breath, Swelling and Other (See Comments)    Swelling in throat  . Minoxidil Shortness Of Breath  . Darvocet [Propoxyphene N-Acetaminophen] Hives    Consultations:  Treatment Team:   KeCorliss Parish MD  RoRoney JaffeMD   Procedures (Echo, Carotid, EGD, Colonoscopy, ERCP)   Radiological studies: Dg Chest 2 View  Result Date: 06/09/2016 CLINICAL DATA:  Acute onset of generalized chest pain and shortness of breath. Initial encounter. EXAM: CHEST  2 VIEW COMPARISON:  Chest radiograph performed 06/07/2016 FINDINGS: The lungs are well-aerated and clear. There is no evidence of focal opacification, pleural effusion or pneumothorax. The heart is mildly enlarged. No acute osseous abnormalities are seen. A right-sided dual-lumen catheter is noted ending about the distal SVC. IMPRESSION: Mild cardiomegaly.  Lungs remain grossly clear. Electronically Signed   By: JeGarald Balding.D.   On: 06/09/2016 23:13   Dg Chest 2 View  Result Date: 06/07/2016 CLINICAL DATA:  Shortness of breath and chest pain EXAM: CHEST  2 VIEW COMPARISON:  May 17, 2016 FINDINGS: There is no edema or consolidation.  Heart is borderline prominent with pulmonary vascularity within normal limits. There is atherosclerotic calcification aorta. No adenopathy. No bone lesions. No pneumothorax. Central catheter tip is in the superior vena cava near the cavoatrial junction. IMPRESSION: Central catheter tip in superior vena cava near cavoatrial junction. No pneumothorax. No edema or consolidation. Heart borderline prominent. Electronically Signed   By: Lowella Grip III M.D.   On: 06/07/2016 21:30   Mr Brain Ltd Wo Contrast  Result Date: 05/30/2016 CLINICAL DATA:  Initial evaluation for left-sided numbness. EXAM: MRI HEAD WITHOUT CONTRAST TECHNIQUE: Multiplanar, multiecho pulse sequences of the brain and surrounding structures were obtained without intravenous contrast. COMPARISON:  Prior CT from 05/05/2014. FINDINGS: Brain: Cerebral volume within normal limits. Minimal T2/FLAIR hyperintensity within the periventricular white matter, most likely related chronic small vessel ischemic disease, felt to be within normal limits for  age. No abnormal foci of restricted diffusion to suggest acute or subacute ischemia. Gray-white matter differentiation maintained. No evidence for acute or chronic intracranial hemorrhage. No encephalomalacia to suggest chronic infarction. No mass lesion, midline shift, or mass effect. No hydrocephalus. No extra-axial fluid collection. Major dural sinuses are grossly patent. Pituitary gland and suprasellar region within normal limits. Vascular: Major intracranial vascular flow voids are maintained. Skull and upper cervical spine: Craniocervical junction normal. Visualized upper cervical spine unremarkable without significant degenerative changes are stenosis. Bone marrow signal intensity within normal limits. No scalp soft tissue abnormality. Sinuses/Orbits: Globes and orbital soft tissues within normal limits. Moderate mucosal thickening within the maxillary sinuses and ethmoidal air cells. Paranasal sinuses are otherwise clear. No mastoid effusion. Inner ear structures grossly normal. IMPRESSION: 1. Normal brain MRI. No acute intracranial infarct or other process identified. 2. Moderate maxillary and ethmoidal sinus disease, likely allergic/inflammatory in nature. Electronically Signed   By: Jeannine Boga M.D.   On: 05/30/2016 22:03     Subjective:  Discharge Exam: Vitals:   06/18/16 0851 06/18/16 1004 06/18/16 1012 06/18/16 1030  BP: (!) 160/110 (!) 147/110 (!) 165/96 (!) 150/91  Pulse: 76 75 70 71  Resp: 16 16    Temp: 98.2 F (36.8 C) 97.3 F (36.3 C)    TempSrc: Oral Oral    SpO2: 100% 98%    Weight:  74.3 kg (163 lb 12.8 oz)     General: Pt is alert, awake, not in acute distress Cardiovascular: RRR, S1/S2 +, no rubs, no gallops Respiratory: CTA bilaterally, no wheezing, no rhonchi Abdominal: Soft, NT, ND, bowel sounds + Extremities: no edema, no cyanosis   The results of significant diagnostics from this hospitalization (including imaging, microbiology, ancillary and  laboratory) are listed below for reference.    Microbiology: Recent Results (from the past 240 hour(s))  MRSA PCR Screening     Status: None   Collection Time: 06/17/16 12:29 PM  Result Value Ref Range Status   MRSA by PCR NEGATIVE NEGATIVE Final    Comment:        The GeneXpert MRSA Assay (FDA approved for NASAL specimens only), is one component of a comprehensive MRSA colonization surveillance program. It is not intended to diagnose MRSA infection nor to guide or monitor treatment for MRSA infections.      Labs: BNP (last 3 results)  Recent Labs  07/17/15 2135 09/24/15 0324  BNP >4,500.0* >9,379.0*   Basic Metabolic Panel:  Recent Labs Lab 06/17/16 0022 06/17/16 0235 06/17/16 0738 06/18/16 0422  NA 139  --  139 136  K >7.5* 7.1* 4.7 5.7*  CL 102  --  99* 101  CO2 23  --   --  24  GLUCOSE 70  --  109* 86  BUN 65*  --  41* 27*  CREATININE 17.15*  --  10.20* 9.42*  CALCIUM 8.1*  --   --  8.8*   Liver Function Tests:  Recent Labs Lab 06/17/16 0022  AST 17  ALT 9*  ALKPHOS 159*  BILITOT 0.6  PROT 7.5  ALBUMIN 3.4*    Recent Labs Lab 06/17/16 0022  LIPASE 39   No results for input(s): AMMONIA in the last 168 hours. CBC:  Recent Labs Lab 06/17/16 0022 06/17/16 0738 06/18/16 0422  WBC 4.2  --  3.1*  HGB 9.5* 10.5* 10.0*  HCT 29.9* 31.0* 31.8*  MCV 89.5  --  89.6  PLT 240  --  190   Cardiac Enzymes: No results for input(s): CKTOTAL, CKMB, CKMBINDEX, TROPONINI in the last 168 hours. BNP: Invalid input(s): POCBNP CBG:  Recent Labs Lab 06/17/16 0338 06/17/16 0445  GLUCAP 101* 70   D-Dimer No results for input(s): DDIMER in the last 72 hours. Hgb A1c No results for input(s): HGBA1C in the last 72 hours. Lipid Profile No results for input(s): CHOL, HDL, LDLCALC, TRIG, CHOLHDL, LDLDIRECT in the last 72 hours. Thyroid function studies No results for input(s): TSH, T4TOTAL, T3FREE, THYROIDAB in the last 72 hours.  Invalid input(s):  FREET3 Anemia work up No results for input(s): VITAMINB12, FOLATE, FERRITIN, TIBC, IRON, RETICCTPCT in the last 72 hours. Urinalysis    Component Value Date/Time   COLORURINE YELLOW 10/18/2013 [place] 10/18/2013 0419   LABSPEC 1.008 10/18/2013 0419   PHURINE 8.5 (H) 10/18/2013 0419   GLUCOSEU 100 (A) 10/18/2013 0419   HGBUR TRACE (A) 10/18/2013 0419   BILIRUBINUR NEGATIVE 10/18/2013 0419   KETONESUR NEGATIVE 10/18/2013 0419   PROTEINUR 100 (A) 10/18/2013 0419   UROBILINOGEN 0.2 10/18/2013 0419   NITRITE NEGATIVE 10/18/2013 0419   LEUKOCYTESUR NEGATIVE 10/18/2013 0419   Sepsis Labs Invalid input(s): PROCALCITONIN,  WBC,  LACTICIDVEN Microbiology Recent Results (from the past 240 hour(s))  MRSA PCR Screening     Status: None   Collection Time: 06/17/16 12:29 PM  Result Value Ref Range Status   MRSA by PCR NEGATIVE NEGATIVE Final    Comment:        The GeneXpert MRSA Assay (FDA approved for NASAL specimens only), is one component of a comprehensive MRSA colonization surveillance program. It is not intended to diagnose MRSA infection nor to guide or monitor treatment for MRSA infections.      Time coordinating discharge: Over 30 minutes  SIGNED:   Birdie Hopes, MD  [facility] 06/18/2016, 11:18 AM Pager   If 7PM-7AM, please contact night-coverage [email or web address] Password [email or web address]

## 2016-06-18 NOTE — ED Notes (Signed)
Unable to locate pt

## 2016-06-18 NOTE — ED Notes (Signed)
Radiology tech called to inform RN pt refused chest xra

## 2016-06-18 NOTE — Progress Notes (Signed)
ANTICOAGULATION CONSULT NOTE - Follow Up Consult  Pharmacy Consult for Warfarin Indication: Hx PE/DVT  Allergies  Allergen Reactions  . Butalbital-Apap-Caffeine Shortness Of Breath, Swelling and Other (See Comments)    Swelling in throat  . Ferrlecit [Na Ferric Gluc Cplx In Sucrose] Shortness Of Breath, Swelling and Other (See Comments)    Swelling in throat  . Minoxidil Shortness Of Breath  . Darvocet [Propoxyphene N-Acetaminophen] Hives    Patient Measurements: Weight: 166 lb 7.2 oz (75.5 kg)  Vital Signs: BP: 134/90 (11/27 2136)  Labs:  Recent Labs  06/17/16 0022 06/17/16 0738 06/17/16 1055 06/18/16 0422  HGB 9.5* 10.5*  --  10.0*  HCT 29.9* 31.0*  --  31.8*  PLT 240  --   --  190  LABPROT  --   --  13.3 14.3  INR  --   --  1.01 1.11  CREATININE 17.15* 10.20*  --  9.42*    Estimated Creatinine Clearance: 9.8 mL/min (by C-G formula based on SCr of 9.42 mg/dL (H)).   Assessment: 37 YOM with ESRD with no home HD unit who comes to dialyze frequently at [facility] and [place] was asked to resume warfarin from PTA for hx PE/DVT.  Per review of the notes - VTE episodes appear to be related to HD cath and per renal to consider stopping all anticoagulation once [facility] out and AVF matured. The patient is clearly noncompliant outpatient as the INR is always near baseline.   INR today remains SUBtherapeutic (INR 1.11 << 1.01, goal of 2-3). Hgb 10, plts wnl - noted history of anemia due to CKD. No bleeding noted.   Goal of Therapy:  INR 2-3   Plan:  1. Repeat Warfarin 10 mg x 1 dose at 1800 today 2. Will continue to monitor for any signs/symptoms of bleeding and will follow up with PT/INR in the a.m.   Thank you for allowing pharmacy to be a part of this patient's care.  Alycia Rossetti, PharmD, BCPS Clinical Pharmacist Pager: [phone] Clinical phone for 06/18/2016 from 7a-3:30p: [phone] If after 3:30p, please call main pharmacy at: [phone] 06/18/2016 8:46 AM

## 2016-06-19 ENCOUNTER — Encounter (HOSPITAL_COMMUNITY): Payer: Self-pay | Admitting: Emergency Medicine

## 2016-06-19 ENCOUNTER — Emergency Department (HOSPITAL_COMMUNITY)
Admission: EM | Admit: 2016-06-19 | Discharge: 2016-06-19 | Disposition: A | Discharge status: Home / Self Care | Payer: Medicare Other | Attending: Emergency Medicine | Admitting: Emergency Medicine

## 2016-06-19 DIAGNOSIS — Z79899 Other long term (current) drug therapy: Secondary | ICD-10-CM | POA: Insufficient documentation

## 2016-06-19 DIAGNOSIS — I132 Hypertensive heart and chronic kidney disease with heart failure and with stage 5 chronic kidney disease, or end stage renal disease: Secondary | ICD-10-CM | POA: Diagnosis not present

## 2016-06-19 DIAGNOSIS — E1122 Type 2 diabetes mellitus with diabetic chronic kidney disease: Secondary | ICD-10-CM | POA: Diagnosis not present

## 2016-06-19 DIAGNOSIS — Z992 Dependence on renal dialysis: Secondary | ICD-10-CM | POA: Insufficient documentation

## 2016-06-19 DIAGNOSIS — Z87891 Personal history of nicotine dependence: Secondary | ICD-10-CM | POA: Diagnosis not present

## 2016-06-19 DIAGNOSIS — G8929 Other chronic pain: Secondary | ICD-10-CM | POA: Diagnosis not present

## 2016-06-19 DIAGNOSIS — I5042 Chronic combined systolic (congestive) and diastolic (congestive) heart failure: Secondary | ICD-10-CM | POA: Diagnosis not present

## 2016-06-19 DIAGNOSIS — E875 Hyperkalemia: Secondary | ICD-10-CM | POA: Diagnosis not present

## 2016-06-19 DIAGNOSIS — Z7901 Long term (current) use of anticoagulants: Secondary | ICD-10-CM | POA: Insufficient documentation

## 2016-06-19 DIAGNOSIS — N186 End stage renal disease: Secondary | ICD-10-CM | POA: Diagnosis not present

## 2016-06-19 DIAGNOSIS — M545 Low back pain: Secondary | ICD-10-CM | POA: Diagnosis not present

## 2016-06-19 DIAGNOSIS — I951 Orthostatic hypotension: Secondary | ICD-10-CM

## 2016-06-19 DIAGNOSIS — R5383 Other fatigue: Secondary | ICD-10-CM | POA: Diagnosis present

## 2016-06-19 DIAGNOSIS — I120 Hypertensive chronic kidney disease with stage 5 chronic kidney disease or end stage renal disease: Secondary | ICD-10-CM | POA: Diagnosis not present

## 2016-06-19 LAB — CBC WITH DIFFERENTIAL/PLATELET
BASOS ABS: 0 10*3/uL (ref 0.0–0.1)
BASOS PCT: 0 %
EOS PCT: 11 %
Eosinophils Absolute: 0.4 10*3/uL (ref 0.0–0.7)
HEMATOCRIT: 35 % — AB (ref 39.0–52.0)
Hemoglobin: 11.2 g/dL — ABNORMAL LOW (ref 13.0–17.0)
Lymphocytes Relative: 20 %
Lymphs Abs: 0.7 10*3/uL (ref 0.7–4.0)
MCH: 28.9 pg (ref 26.0–34.0)
MCHC: 32 g/dL (ref 30.0–36.0)
MCV: 90.4 fL (ref 78.0–100.0)
MONO ABS: 0.5 10*3/uL (ref 0.1–1.0)
MONOS PCT: 13 %
NEUTROS ABS: 2 10*3/uL (ref 1.7–7.7)
Neutrophils Relative %: 56 %
PLATELETS: 211 10*3/uL (ref 150–400)
RBC: 3.87 MIL/uL — ABNORMAL LOW (ref 4.22–5.81)
RDW: 16.7 % — AB (ref 11.5–15.5)
WBC: 3.6 10*3/uL — ABNORMAL LOW (ref 4.0–10.5)

## 2016-06-19 LAB — TROPONIN I: Troponin I: 0.03 ng/mL (ref <0.03)

## 2016-06-19 LAB — BASIC METABOLIC PANEL
Anion gap: 13 (ref 5–15)
BUN: 20 mg/dL (ref 6–20)
CALCIUM: 8.8 mg/dL — AB (ref 8.9–10.3)
CO2: 26 mmol/L (ref 22–32)
CREATININE: 8.2 mg/dL — AB (ref 0.61–1.24)
Chloride: 96 mmol/L — ABNORMAL LOW (ref 101–111)
GFR calc Af Amer: 8 mL/min — ABNORMAL LOW (ref >60)
GFR, EST NON AFRICAN AMERICAN: 7 mL/min — AB (ref >60)
GLUCOSE: 169 mg/dL — AB (ref 65–99)
Potassium: 5.3 mmol/L — ABNORMAL HIGH (ref 3.5–5.1)
Sodium: 135 mmol/L (ref 135–145)

## 2016-06-19 LAB — PROTIME-INR
INR: 1.2
PROTHROMBIN TIME: 15.2 s (ref 11.4–15.2)

## 2016-06-19 MED ORDER — SODIUM CHLORIDE 0.9 % IV BOLUS (SEPSIS)
500.0000 mL | Freq: Once | INTRAVENOUS | Status: AC
  Administered 2016-06-19: 500 mL via INTRAVENOUS

## 2016-06-19 MED ORDER — MORPHINE SULFATE (PF) 4 MG/ML IV SOLN
4.0000 mg | Freq: Once | INTRAVENOUS | Status: AC
  Administered 2016-06-19: 4 mg via INTRAVENOUS
  Filled 2016-06-19: qty 1

## 2016-06-19 MED ORDER — ONDANSETRON HCL 4 MG/2ML IJ SOLN
4.0000 mg | Freq: Once | INTRAMUSCULAR | Status: AC
  Administered 2016-06-19: 4 mg via INTRAVENOUS
  Filled 2016-06-19: qty 2

## 2016-06-19 MED ORDER — OXYCODONE-ACETAMINOPHEN 5-325 MG PO TABS
1.0000 | ORAL_TABLET | Freq: Once | ORAL | Status: AC
  Administered 2016-06-19: 1 via ORAL
  Filled 2016-06-19: qty 1

## 2016-06-19 NOTE — ED Notes (Signed)
Patient refused to take discharge papers threw them in the trash , upset because MD will not give him narcotics.

## 2016-06-19 NOTE — ED Notes (Signed)
Pt given [place] sandwich

## 2016-06-19 NOTE — ED Provider Notes (Signed)
[facility] DEPT Provider Note   CSN: [id number] Arrival date & time: 06/19/16  0737     History   Chief Complaint Chief Complaint  Patient presents with  . Emesis  . Fatigue    HPI Frank [name] is a 50 y.o. male.  Pt presents to the ED today with chest pain.  The pt is a dialysis patient with no OP unit.  He has been getting his dialysis here.  The pt was admitted on the 27th for dialysis.  He was discharged yesterday after dialysis.  The pt said he went home, was sitting on the couch and felt like he was going to pass out.  The pt said that he got up and went outside.  There, he passed out.  He said he was soaking in sweat.  He did not injure himself.  When he got back inside, his SBP was 80.  He came to the ED last night, but LWBS.  The pt's SBP was 100 this morning, so he came back in.  He has CP with n/v.      Past Medical History:  Diagnosis Date  . Anemia   . Anxiety   . Chronic combined systolic and diastolic CHF (congestive heart failure) (HCC)    a. EF 20-25% by echo in 08/2015 b. echo 10/2015: EF 35-40%, diffuse HK, severe LAE, moderate RAE, small pericardial effusion  . Complication of anesthesia    itching, sore throat  . Depression   . Dialysis patient ([place])   . ESRD (end stage renal disease) ([place])    due to HTN per patient, followed at [facility], s/p failed kidney transplant - dialysis Tue, Th, Sat  . Hyperkalemia 12/2015  . Hypertension   . Junctional rhythm    a. noted in 08/2015: hyperkalemic at that time  b. 12/2015: presented in junctional rhythm w/ K+ of 6.6. Resolved with improvement of K+ levels.  . Nonischemic cardiomyopathy ([place])    a. 08/2014: cath showing minimal CAD, but tortuous arteries noted.   . Personal history of DVT (deep vein thrombosis)/ PE 05/26/2016   In Oct 2015 had small subsemental LUL PE w/o DVT (LE dopplers neg) and was felt to be HD cath related, treated w coumadin.  IN May 2016 had small vein DVT (acute/subacute) in the R  basilic/ brachial veins of the RUE, resumed on coumadin.  Had R sided HD cath at that time.    . Renal insufficiency   . Shortness of breath   . Type II diabetes mellitus (HCC)    No history per patient, but remains under history as A1c would not be accurate given on dialysis    Patient Active Problem List   Diagnosis Date Noted  . ESRD needing dialysis ([place]) 06/14/2016  . Uremia 06/07/2016  . Hyperkalemia 05/29/2016  . Chronic back pain 05/29/2016  . GERD (gastroesophageal reflux disease) 05/29/2016  . Personal history of DVT (deep vein thrombosis)/ PE 05/26/2016  . Bradycardia 03/31/2016  . Nonischemic cardiomyopathy ([place]) 01/09/2016  . Bilateral low back pain without sciatica   . Left renal mass 10/30/2015  . Constipation 10/30/2015  . Adjustment disorder with mixed anxiety and depressed mood 08/20/2015  . Chronic epididymitis 06/19/2015  . Groin pain, chronic, right 06/19/2015  . Incarcerated right inguinal hernia 02/16/2015  . Essential hypertension 01/02/2015  . Dyslipidemia   . ESRD on hemodialysis ([place])   . Anemia of chronic kidney failure 06/24/2013    Past Surgical History:  Procedure Laterality Date  .  CAPD INSERTION    . CAPD REMOVAL    . INGUINAL HERNIA REPAIR Right 02/14/2015   Procedure: REPAIR INCARCERATED RIGHT INGUINAL HERNIA;  Surgeon: [name], MD;  Location: [facility];  Service: General;  Laterality: Right;  . INSERTION OF DIALYSIS CATHETER Right 09/23/2015   Procedure: exchange of Right internal Dialysis Catheter.;  Surgeon: [name], MD;  Location: [place];  Service: Vascular;  Laterality: Right;  . KIDNEY RECEIPIENT  2006   failed and started HD in March 2014  . LEFT HEART CATHETERIZATION WITH CORONARY ANGIOGRAM N/A 09/02/2014   Procedure: LEFT HEART CATHETERIZATION WITH CORONARY ANGIOGRAM;  Surgeon: [name], MD;  Location: [facility] CATH LAB;  Service: Cardiovascular;  Laterality: N/A;       Home Medications    Prior to Admission medications    Medication Sig Start Date End Date Taking? Authorizing Provider  allopurinol (ZYLOPRIM) 100 MG tablet Take 100 mg by mouth daily.   Yes Historical Provider, MD  amLODipine (NORVASC) 10 MG tablet Take 10 mg by mouth 2 (two) times daily.    Yes Historical Provider, MD  atorvastatin (LIPITOR) 40 MG tablet Take 1 tablet (40 mg total) by mouth daily at 6 PM. 09/02/14  Yes Barton Dubois, MD  carvedilol (COREG) 25 MG tablet Take 25 mg by mouth 2 (two) times daily with a meal.   Yes Historical Provider, MD  cinacalcet (SENSIPAR) 30 MG tablet Take 30 mg by mouth every evening.    Yes Historical Provider, MD  cloNIDine (CATAPRES - DOSED IN MG/24 HR) 0.3 mg/24hr patch Place 0.3 mg onto the skin once a week.   Yes Historical Provider, MD  doxazosin (CARDURA) 2 MG tablet Take 2 mg by mouth daily.   Yes Historical Provider, MD  famotidine (PEPCID) 20 MG tablet Take 1 tablet (20 mg total) by mouth 2 (two) times daily. 02/13/16  Yes Charlesetta Shanks, MD  gabapentin (NEURONTIN) 100 MG capsule Take 1 capsule (100 mg total) by mouth at bedtime. Patient taking differently: Take 100 mg by mouth 3 (three) times daily.  05/21/16  Yes [name], DO  hydrALAZINE (APRESOLINE) 100 MG tablet Take 100 mg by mouth 2 (two) times daily.    Yes Historical Provider, MD  isosorbide mononitrate (IMDUR) 60 MG 24 hr tablet Take 1 tablet (60 mg total) by mouth daily. 01/09/16  Yes Smiley Houseman, MD  omeprazole (PRILOSEC) 20 MG capsule Take 20 mg by mouth daily. 07/01/13  Yes Historical Provider, MD  ondansetron (ZOFRAN ODT) 4 MG disintegrating tablet Take 1 tablet (4 mg total) by mouth every 8 (eight) hours as needed for nausea or vomiting. 05/16/16  Yes Sherwood Gambler, MD  oxycodone (OXY-IR) 5 MG capsule Take 1 capsule (5 mg total) by mouth every 4 (four) hours as needed for pain. 05/30/16  Yes [name], MD  oxyCODONE-acetaminophen (PERCOCET/ROXICET) 5-325 MG tablet Take 1 tablet by mouth every 6 (six) hours as needed for  severe pain.   Yes Historical Provider, MD  predniSONE (DELTASONE) 10 MG tablet Take 10 mg by mouth daily with breakfast.   Yes Historical Provider, MD  sevelamer carbonate (RENVELA) 800 MG tablet Take 1,600 mg by mouth 3 (three) times daily with meals.    Yes Historical Provider, MD  sulfamethoxazole-trimethoprim (BACTRIM,SEPTRA) 400-80 MG tablet Take 1 tablet by mouth 3 (three) times a week.   Yes Historical Provider, MD  warfarin (COUMADIN) 2 MG tablet Take 2 mg by mouth daily. Take with 24m tablet to  equal 35m 01/31/16  Yes Historical Provider, MD  warfarin (COUMADIN) 5 MG tablet Take 5 mg by mouth See admin instructions. Take 1 tablet (5 mg) by mouth with a 2 mg tablet for a 7 mg dose 01/31/16  Yes Historical Provider, MD    Family History Family History  Problem Relation Age of Onset  . Hypertension Other     Social History Social History  Substance Use Topics  . Smoking status: Former Smoker    Packs/day: 0.00    Years: 1.00    Types: Cigarettes  . Smokeless tobacco: Never Used     Comment: quit Jan 2014  . Alcohol use No     Allergies   Butalbital-apap-caffeine; Ferrlecit [na ferric gluc cplx in sucrose]; Minoxidil; and Darvocet [propoxyphene n-acetaminophen]   Review of Systems Review of Systems  Cardiovascular: Positive for chest pain.  Gastrointestinal: Positive for nausea and vomiting.  All other systems reviewed and are negative.    Physical Exam Updated Vital Signs BP (S) 123/88 (BP Location: Right Arm)   Pulse (!) 58   Temp 98 F (36.7 C) (Oral)   Resp 17   SpO2 99%   Physical Exam  Constitutional: He is oriented to person, place, and time. He appears well-developed and well-nourished.  HENT:  Head: Normocephalic and atraumatic.  Right Ear: External ear normal.  Left Ear: External ear normal.  Nose: Nose normal.  Mouth/Throat: Oropharynx is clear and moist.  Eyes: Conjunctivae and EOM are normal. Pupils are equal, round, and reactive to light.    Neck: Normal range of motion. Neck supple.  Cardiovascular: Normal rate, regular rhythm, normal heart sounds and intact distal pulses.   Pulmonary/Chest: Effort normal and breath sounds normal.  Abdominal: Soft. Bowel sounds are normal.  Musculoskeletal: Normal range of motion.  Neurological: He is alert and oriented to person, place, and time.  Skin: Skin is warm.  Psychiatric: He has a normal mood and affect. His behavior is normal. Judgment and thought content normal.  Nursing note and vitals reviewed.    ED Treatments / Results  Labs (all labs ordered are listed, but only abnormal results are displayed) Labs Reviewed  BASIC METABOLIC PANEL - Abnormal; Notable for the following:       Result Value   Potassium 5.3 (*)    Chloride 96 (*)    Glucose, Bld 169 (*)    Creatinine, Ser 8.20 (*)    Calcium 8.8 (*)    GFR calc non Af Amer 7 (*)    GFR calc Af Amer 8 (*)    All other components within normal limits  CBC WITH DIFFERENTIAL/PLATELET - Abnormal; Notable for the following:    WBC 3.6 (*)    RBC 3.87 (*)    Hemoglobin 11.2 (*)    HCT 35.0 (*)    RDW 16.7 (*)    All other components within normal limits  TROPONIN I - Abnormal; Notable for the following:    Troponin I 0.03 (*)    All other components within normal limits  PROTIME-INR    EKG  EKG Interpretation  Date/Time:  Wednesday June 19 2016 07:17:52 EST Ventricular Rate:  61 PR Interval:    QRS Duration: 115 QT Interval:  434 QTC Calculation: 438 R Axis:   -4 Text Interpretation:  Sinus rhythm Probable left atrial enlargement LVH with IVCD and secondary repol abnrm Confirmed by Addalynne Golding MD, Tari Lecount ([id number] on 06/19/2016 7:31:10 AM Also confirmed by [facility] HospitalMD, Ivori Storr ([id number], editor LYehuda Mao(  [id number])  on 06/19/2016 8:02:38 AM       Radiology No results found.  Procedures Procedures (including critical care time)  Medications Ordered in ED Medications  sodium chloride 0.9 % bolus 500 mL (0  mLs Intravenous Stopped 06/19/16 0830)  ondansetron (ZOFRAN) injection 4 mg (4 mg Intravenous Given 06/19/16 0737)  morphine 4 MG/ML injection 4 mg (4 mg Intravenous Given 06/19/16 0738)  sodium chloride 0.9 % bolus 500 mL (0 mLs Intravenous Stopped 06/19/16 1115)  oxyCODONE-acetaminophen (PERCOCET/ROXICET) 5-325 MG per tablet 1 tablet (1 tablet Oral Given 06/19/16 1102)     Initial Impression / Assessment and Plan / ED Course  I have reviewed the triage vital signs and the nursing notes.  Pertinent labs & imaging results that were available during my care of the patient were reviewed by me and considered in my medical decision making (see chart for details).  Clinical Course     Pt is feeling better after 1L of NS.  The pt likely had too much fluid taken off yesterday.  I did not want to give him too much as he is a dialysis patient without an outpatient center.  Pt's cp is very pleuritic and unlikely cardiac.  Final Clinical Impressions(s) / ED Diagnoses   Final diagnoses:  Orthostatic hypotension  ESRD (end stage renal disease) on dialysis [facility])    New Prescriptions New Prescriptions   No medications on file     Isla Pence, MD 06/19/16 1121

## 2016-06-19 NOTE — ED Triage Notes (Signed)
Pt. reports emesis with fatigue /" feels tired' onset yesterday after receiving hemodialysis treatment yesterday , pt. stated he passed out yesterday at dialysis .

## 2016-06-20 ENCOUNTER — Non-Acute Institutional Stay (HOSPITAL_COMMUNITY)
Admission: EM | Admit: 2016-06-20 | Discharge: 2016-06-20 | Disposition: A | Discharge status: Home / Self Care | Payer: Medicare Other | Attending: Emergency Medicine | Admitting: Emergency Medicine

## 2016-06-20 ENCOUNTER — Encounter (HOSPITAL_COMMUNITY): Payer: Self-pay | Admitting: Emergency Medicine

## 2016-06-20 DIAGNOSIS — Z79899 Other long term (current) drug therapy: Secondary | ICD-10-CM | POA: Insufficient documentation

## 2016-06-20 DIAGNOSIS — E875 Hyperkalemia: Secondary | ICD-10-CM | POA: Diagnosis not present

## 2016-06-20 DIAGNOSIS — M545 Low back pain: Secondary | ICD-10-CM

## 2016-06-20 DIAGNOSIS — N186 End stage renal disease: Secondary | ICD-10-CM

## 2016-06-20 DIAGNOSIS — E1122 Type 2 diabetes mellitus with diabetic chronic kidney disease: Secondary | ICD-10-CM | POA: Insufficient documentation

## 2016-06-20 DIAGNOSIS — G8929 Other chronic pain: Secondary | ICD-10-CM | POA: Insufficient documentation

## 2016-06-20 DIAGNOSIS — I428 Other cardiomyopathies: Secondary | ICD-10-CM | POA: Insufficient documentation

## 2016-06-20 DIAGNOSIS — Z992 Dependence on renal dialysis: Secondary | ICD-10-CM | POA: Insufficient documentation

## 2016-06-20 DIAGNOSIS — Z7901 Long term (current) use of anticoagulants: Secondary | ICD-10-CM | POA: Insufficient documentation

## 2016-06-20 DIAGNOSIS — E785 Hyperlipidemia, unspecified: Secondary | ICD-10-CM | POA: Insufficient documentation

## 2016-06-20 DIAGNOSIS — I132 Hypertensive heart and chronic kidney disease with heart failure and with stage 5 chronic kidney disease, or end stage renal disease: Secondary | ICD-10-CM | POA: Insufficient documentation

## 2016-06-20 DIAGNOSIS — Z87891 Personal history of nicotine dependence: Secondary | ICD-10-CM | POA: Insufficient documentation

## 2016-06-20 DIAGNOSIS — K219 Gastro-esophageal reflux disease without esophagitis: Secondary | ICD-10-CM | POA: Insufficient documentation

## 2016-06-20 DIAGNOSIS — I5042 Chronic combined systolic (congestive) and diastolic (congestive) heart failure: Secondary | ICD-10-CM | POA: Insufficient documentation

## 2016-06-20 DIAGNOSIS — Z7952 Long term (current) use of systemic steroids: Secondary | ICD-10-CM | POA: Insufficient documentation

## 2016-06-20 DIAGNOSIS — Z86718 Personal history of other venous thrombosis and embolism: Secondary | ICD-10-CM | POA: Insufficient documentation

## 2016-06-20 LAB — CBC
HEMATOCRIT: 35.3 % — AB (ref 39.0–52.0)
HEMOGLOBIN: 11.1 g/dL — AB (ref 13.0–17.0)
MCH: 28.5 pg (ref 26.0–34.0)
MCHC: 31.4 g/dL (ref 30.0–36.0)
MCV: 90.7 fL (ref 78.0–100.0)
PLATELETS: 197 10*3/uL (ref 150–400)
RBC: 3.89 MIL/uL — AB (ref 4.22–5.81)
RDW: 16.8 % — ABNORMAL HIGH (ref 11.5–15.5)
WBC: 3.6 10*3/uL — ABNORMAL LOW (ref 4.0–10.5)

## 2016-06-20 LAB — BASIC METABOLIC PANEL
Anion gap: 13 (ref 5–15)
BUN: 31 mg/dL — AB (ref 6–20)
CHLORIDE: 99 mmol/L — AB (ref 101–111)
CO2: 24 mmol/L (ref 22–32)
Calcium: 8.8 mg/dL — ABNORMAL LOW (ref 8.9–10.3)
Creatinine, Ser: 10.73 mg/dL — ABNORMAL HIGH (ref 0.61–1.24)
GFR calc Af Amer: 6 mL/min — ABNORMAL LOW (ref >60)
GFR calc non Af Amer: 5 mL/min — ABNORMAL LOW (ref >60)
GLUCOSE: 131 mg/dL — AB (ref 65–99)
POTASSIUM: 5.4 mmol/L — AB (ref 3.5–5.1)
Sodium: 136 mmol/L (ref 135–145)

## 2016-06-20 MED ORDER — ACETAMINOPHEN 325 MG PO TABS
650.0000 mg | ORAL_TABLET | Freq: Once | ORAL | Status: DC
  Filled 2016-06-20: qty 2

## 2016-06-20 MED ORDER — HYDROCODONE-ACETAMINOPHEN 5-325 MG PO TABS
2.0000 | ORAL_TABLET | Freq: Once | ORAL | Status: AC
  Administered 2016-06-20: 2 via ORAL
  Filled 2016-06-20: qty 2

## 2016-06-20 NOTE — ED Notes (Signed)
Pt refused to get into bed and onto monitor.

## 2016-06-20 NOTE — ED Notes (Addendum)
Pt states he's leaving because he has to sell property.  He'll come back for dialysis tomorrow.  Notified that wait will be longer tomorrow but he still did not want to stay.  Dr Ashok Cordia notified.  Pt left without discharge papers or discharge vitals.  Notified not to drive after taking vicodin.

## 2016-06-20 NOTE — ED Provider Notes (Signed)
[facility] DEPT Provider Note   CSN: [id number] Arrival date & time: 06/20/16  9381     History   Chief Complaint Chief Complaint  Patient presents with  . Routine Hemodialysis    HPI Frank [name] is a 50 y.o. male.  Patient with hx esrd on hd, presents for dialysis. States he currently comes to hospital for dialysis, but plans to become enrolled in outpatient hd in [place] once his LUE fistula matures in the next couple months. Had hemodialysis on Monday and Tuesday for this week.  Denies feeling weak or sob. Is eating and drinking normally. States has chronic low back pain, but denies any acute change or worsening.    The history is provided by the patient.    Past Medical History:  Diagnosis Date  . Anemia   . Anxiety   . Chronic combined systolic and diastolic CHF (congestive heart failure) (HCC)    a. EF 20-25% by echo in 08/2015 b. echo 10/2015: EF 35-40%, diffuse HK, severe LAE, moderate RAE, small pericardial effusion  . Complication of anesthesia    itching, sore throat  . Depression   . Dialysis patient ([place])   . ESRD (end stage renal disease) ([place])    due to HTN per patient, followed at [facility], s/p failed kidney transplant - dialysis Tue, Th, Sat  . Hyperkalemia 12/2015  . Hypertension   . Junctional rhythm    a. noted in 08/2015: hyperkalemic at that time  b. 12/2015: presented in junctional rhythm w/ K+ of 6.6. Resolved with improvement of K+ levels.  . Nonischemic cardiomyopathy ([place])    a. 08/2014: cath showing minimal CAD, but tortuous arteries noted.   . Personal history of DVT (deep vein thrombosis)/ PE 05/26/2016   In Oct 2015 had small subsemental LUL PE w/o DVT (LE dopplers neg) and was felt to be HD cath related, treated w coumadin.  IN May 2016 had small vein DVT (acute/subacute) in the R basilic/ brachial veins of the RUE, resumed on coumadin.  Had R sided HD cath at that time.    . Renal insufficiency   . Shortness of breath   . Type II  diabetes mellitus (HCC)    No history per patient, but remains under history as A1c would not be accurate given on dialysis    Patient Active Problem List   Diagnosis Date Noted  . ESRD needing dialysis ([place]) 06/14/2016  . Uremia 06/07/2016  . Hyperkalemia 05/29/2016  . Chronic back pain 05/29/2016  . GERD (gastroesophageal reflux disease) 05/29/2016  . Personal history of DVT (deep vein thrombosis)/ PE 05/26/2016  . Bradycardia 03/31/2016  . Nonischemic cardiomyopathy ([place]) 01/09/2016  . Bilateral low back pain without sciatica   . Left renal mass 10/30/2015  . Constipation 10/30/2015  . Adjustment disorder with mixed anxiety and depressed mood 08/20/2015  . Chronic epididymitis 06/19/2015  . Groin pain, chronic, right 06/19/2015  . Incarcerated right inguinal hernia 02/16/2015  . Essential hypertension 01/02/2015  . Dyslipidemia   . ESRD on hemodialysis ([place])   . Anemia of chronic kidney failure 06/24/2013    Past Surgical History:  Procedure Laterality Date  . CAPD INSERTION    . CAPD REMOVAL    . INGUINAL HERNIA REPAIR Right 02/14/2015   Procedure: REPAIR INCARCERATED RIGHT INGUINAL HERNIA;  Surgeon: [name], MD;  Location: [facility];  Service: General;  Laterality: Right;  . INSERTION OF DIALYSIS CATHETER Right 09/23/2015   Procedure: exchange of Right internal Dialysis Catheter.;  Surgeon: [name], MD;  Location: [facility];  Service: Vascular;  Laterality: Right;  . KIDNEY RECEIPIENT  2006   failed and started HD in March 2014  . LEFT HEART CATHETERIZATION WITH CORONARY ANGIOGRAM N/A 09/02/2014   Procedure: LEFT HEART CATHETERIZATION WITH CORONARY ANGIOGRAM;  Surgeon: [name], MD;  Location: [facility] CATH LAB;  Service: Cardiovascular;  Laterality: N/A;       Home Medications    Prior to Admission medications   Medication Sig Start Date End Date Taking? Authorizing Provider  allopurinol (ZYLOPRIM) 100 MG tablet Take 100 mg by mouth daily.    Historical  Provider, MD  amLODipine (NORVASC) 10 MG tablet Take 10 mg by mouth 2 (two) times daily.     Historical Provider, MD  atorvastatin (LIPITOR) 40 MG tablet Take 1 tablet (40 mg total) by mouth daily at 6 PM. 09/02/14   Barton Dubois, MD  carvedilol (COREG) 25 MG tablet Take 25 mg by mouth 2 (two) times daily with a meal.    Historical Provider, MD  cinacalcet (SENSIPAR) 30 MG tablet Take 30 mg by mouth every evening.     Historical Provider, MD  cloNIDine (CATAPRES - DOSED IN MG/24 HR) 0.3 mg/24hr patch Place 0.3 mg onto the skin once a week.    Historical Provider, MD  doxazosin (CARDURA) 2 MG tablet Take 2 mg by mouth daily.    Historical Provider, MD  famotidine (PEPCID) 20 MG tablet Take 1 tablet (20 mg total) by mouth 2 (two) times daily. 02/13/16   Charlesetta Shanks, MD  gabapentin (NEURONTIN) 100 MG capsule Take 1 capsule (100 mg total) by mouth at bedtime. Patient taking differently: Take 100 mg by mouth 3 (three) times daily.  05/21/16   [name], DO  hydrALAZINE (APRESOLINE) 100 MG tablet Take 100 mg by mouth 2 (two) times daily.     Historical Provider, MD  isosorbide mononitrate (IMDUR) 60 MG 24 hr tablet Take 1 tablet (60 mg total) by mouth daily. 01/09/16   Smiley Houseman, MD  omeprazole (PRILOSEC) 20 MG capsule Take 20 mg by mouth daily. 07/01/13   Historical Provider, MD  ondansetron (ZOFRAN ODT) 4 MG disintegrating tablet Take 1 tablet (4 mg total) by mouth every 8 (eight) hours as needed for nausea or vomiting. 05/16/16   Sherwood Gambler, MD  oxycodone (OXY-IR) 5 MG capsule Take 1 capsule (5 mg total) by mouth every 4 (four) hours as needed for pain. 05/30/16   [name], MD  oxyCODONE-acetaminophen (PERCOCET/ROXICET) 5-325 MG tablet Take 1 tablet by mouth every 6 (six) hours as needed for severe pain.    Historical Provider, MD  predniSONE (DELTASONE) 10 MG tablet Take 10 mg by mouth daily with breakfast.    Historical Provider, MD  sevelamer carbonate (RENVELA) 800 MG tablet  Take 1,600 mg by mouth 3 (three) times daily with meals.     Historical Provider, MD  sulfamethoxazole-trimethoprim (BACTRIM,SEPTRA) 400-80 MG tablet Take 1 tablet by mouth 3 (three) times a week.    Historical Provider, MD  warfarin (COUMADIN) 2 MG tablet Take 2 mg by mouth daily. Take with 84m tablet to equal 785m7/12/17   Historical Provider, MD  warfarin (COUMADIN) 5 MG tablet Take 5 mg by mouth See admin instructions. Take 1 tablet (5 mg) by mouth with a 2 mg tablet for a 7 mg dose 01/31/16   Historical Provider, MD    Family History Family History  Problem Relation Age of Onset  .  Hypertension Other     Social History Social History  Substance Use Topics  . Smoking status: Former Smoker    Packs/day: 0.00    Years: 1.00    Types: Cigarettes  . Smokeless tobacco: Never Used     Comment: quit Jan 2014  . Alcohol use No     Allergies   Butalbital-apap-caffeine; Ferrlecit [na ferric gluc cplx in sucrose]; Minoxidil; and Darvocet [propoxyphene n-acetaminophen]   Review of Systems Review of Systems  Constitutional: Negative for fever.  Respiratory: Negative for shortness of breath.   Cardiovascular: Negative for chest pain.  Gastrointestinal: Negative for abdominal pain, nausea and vomiting.  Musculoskeletal: Positive for back pain.  Neurological: Negative for weakness.     Physical Exam Updated Vital Signs BP 150/96 (BP Location: Left Arm)   Pulse 64   Temp 97.3 F (36.3 C) (Oral)   Resp 18   Ht _0  (1.88 m)   Wt 72.6 kg   SpO2 97%   BMI 20.54 kg/m   Physical Exam  Constitutional: He appears well-developed and well-nourished. No distress.  HENT:  Head: Atraumatic.  Eyes: Conjunctivae are normal.  Neck: Neck supple. No tracheal deviation present.  Cardiovascular: Normal rate, regular rhythm, normal heart sounds and intact distal pulses.   Pulmonary/Chest: Effort normal and breath sounds normal. No accessory muscle usage. No respiratory distress.  Right HD  cath without sign of infection.   Abdominal: He exhibits no distension. There is no tenderness.  Musculoskeletal: He exhibits no edema.  TLS spine non tender. Mild lumbar muscular tenderness. LUE fistula w palp thrill.   Neurological: He is alert.  Steady gait.   Skin: Skin is warm and dry. He is not diaphoretic.  Psychiatric: He has a normal mood and affect.  Nursing note and vitals reviewed.    ED Treatments / Results  Labs (all labs ordered are listed, but only abnormal results are displayed) Results for orders placed or performed during the hospital encounter of 06/20/16  CBC  Result Value Ref Range   WBC 3.6 (L) 4.0 - 10.5 K/uL   RBC 3.89 (L) 4.22 - 5.81 MIL/uL   Hemoglobin 11.1 (L) 13.0 - 17.0 g/dL   HCT 35.3 (L) 39.0 - 52.0 %   MCV 90.7 78.0 - 100.0 fL   MCH 28.5 26.0 - 34.0 pg   MCHC 31.4 30.0 - 36.0 g/dL   RDW 16.8 (H) 11.5 - 15.5 %   Platelets 197 150 - 400 K/uL  Basic metabolic panel  Result Value Ref Range   Sodium 136 135 - 145 mmol/L   Potassium 5.4 (H) 3.5 - 5.1 mmol/L   Chloride 99 (L) 101 - 111 mmol/L   CO2 24 22 - 32 mmol/L   Glucose, Bld 131 (H) 65 - 99 mg/dL   BUN 31 (H) 6 - 20 mg/dL   Creatinine, Ser 10.73 (H) 0.61 - 1.24 mg/dL   Calcium 8.8 (L) 8.9 - 10.3 mg/dL   GFR calc non Af Amer 5 (L) >60 mL/min   GFR calc Af Amer 6 (L) >60 mL/min   Anion gap 13 5 - 15   Dg Chest 2 View  Result Date: 06/09/2016 CLINICAL DATA:  Acute onset of generalized chest pain and shortness of breath. Initial encounter. EXAM: CHEST  2 VIEW COMPARISON:  Chest radiograph performed 06/07/2016 FINDINGS: The lungs are well-aerated and clear. There is no evidence of focal opacification, pleural effusion or pneumothorax. The heart is mildly enlarged. No acute osseous abnormalities are seen.  A right-sided dual-lumen catheter is noted ending about the distal SVC. IMPRESSION: Mild cardiomegaly.  Lungs remain grossly clear. Electronically Signed   By: Garald Balding M.D.   On:  06/09/2016 23:13   Dg Chest 2 View  Result Date: 06/07/2016 CLINICAL DATA:  Shortness of breath and chest pain EXAM: CHEST  2 VIEW COMPARISON:  May 17, 2016 FINDINGS: There is no edema or consolidation. Heart is borderline prominent with pulmonary vascularity within normal limits. There is atherosclerotic calcification aorta. No adenopathy. No bone lesions. No pneumothorax. Central catheter tip is in the superior vena cava near the cavoatrial junction. IMPRESSION: Central catheter tip in superior vena cava near cavoatrial junction. No pneumothorax. No edema or consolidation. Heart borderline prominent. Electronically Signed   By: Lowella Grip III M.D.   On: 06/07/2016 21:30   Mr Brain Ltd Wo Contrast  Result Date: 05/30/2016 CLINICAL DATA:  Initial evaluation for left-sided numbness. EXAM: MRI HEAD WITHOUT CONTRAST TECHNIQUE: Multiplanar, multiecho pulse sequences of the brain and surrounding structures were obtained without intravenous contrast. COMPARISON:  Prior CT from 05/05/2014. FINDINGS: Brain: Cerebral volume within normal limits. Minimal T2/FLAIR hyperintensity within the periventricular white matter, most likely related chronic small vessel ischemic disease, felt to be within normal limits for age. No abnormal foci of restricted diffusion to suggest acute or subacute ischemia. Gray-white matter differentiation maintained. No evidence for acute or chronic intracranial hemorrhage. No encephalomalacia to suggest chronic infarction. No mass lesion, midline shift, or mass effect. No hydrocephalus. No extra-axial fluid collection. Major dural sinuses are grossly patent. Pituitary gland and suprasellar region within normal limits. Vascular: Major intracranial vascular flow voids are maintained. Skull and upper cervical spine: Craniocervical junction normal. Visualized upper cervical spine unremarkable without significant degenerative changes are stenosis. Bone marrow signal intensity within  normal limits. No scalp soft tissue abnormality. Sinuses/Orbits: Globes and orbital soft tissues within normal limits. Moderate mucosal thickening within the maxillary sinuses and ethmoidal air cells. Paranasal sinuses are otherwise clear. No mastoid effusion. Inner ear structures grossly normal. IMPRESSION: 1. Normal brain MRI. No acute intracranial infarct or other process identified. 2. Moderate maxillary and ethmoidal sinus disease, likely allergic/inflammatory in nature. Electronically Signed   By: Jeannine Boga M.D.   On: 05/30/2016 22:03    EKG  EKG Interpretation None       Radiology No results found.  Procedures Procedures (including critical care time)  Medications Ordered in ED Medications - No data to display   Initial Impression / Assessment and Plan / ED Course  I have reviewed the triage vital signs and the nursing notes.  Pertinent labs & imaging results that were available during my care of the patient were reviewed by me and considered in my medical decision making (see chart for details).  Clinical Course     Renal consulted re possible hd.   Pt requesting pain meds for his pain.  Dr Jonnie Finner indicates he does plan to dialyze today. Percocet po.   Reviewed nursing notes and prior charts for additional history.     Final Clinical Impressions(s) / ED Diagnoses   Final diagnoses:  None    New Prescriptions New Prescriptions   No medications on file     Lajean Saver, MD 06/20/16 1008

## 2016-06-20 NOTE — ED Triage Notes (Signed)
Pt. is here for hemodialysis , last treatment Monday this week , denies pain / respirations unlabored .

## 2016-06-20 NOTE — ED Notes (Signed)
Pt states he is only her for dialysis.  Denies any pain except for his chronic back pain that he would like to be tx while he was here.  He requests breakfast and apple juice.

## 2016-06-22 ENCOUNTER — Encounter (HOSPITAL_COMMUNITY): Payer: Self-pay | Admitting: *Deleted

## 2016-06-22 ENCOUNTER — Non-Acute Institutional Stay (HOSPITAL_COMMUNITY)
Admission: EM | Admit: 2016-06-22 | Discharge: 2016-06-22 | Discharge status: Against Medical Advice | Payer: Medicare Other | Source: Home / Self Care | Attending: Emergency Medicine | Admitting: Emergency Medicine

## 2016-06-22 ENCOUNTER — Inpatient Hospital Stay (HOSPITAL_COMMUNITY)
Admission: EM | Admit: 2016-06-22 | Discharge: 2016-06-24 | DRG: 640 | Disposition: A | Discharge status: Home / Self Care | Payer: Medicare Other | Attending: Family Medicine | Admitting: Family Medicine

## 2016-06-22 DIAGNOSIS — E875 Hyperkalemia: Secondary | ICD-10-CM | POA: Diagnosis not present

## 2016-06-22 DIAGNOSIS — Z87891 Personal history of nicotine dependence: Secondary | ICD-10-CM | POA: Diagnosis not present

## 2016-06-22 DIAGNOSIS — D631 Anemia in chronic kidney disease: Secondary | ICD-10-CM | POA: Diagnosis present

## 2016-06-22 DIAGNOSIS — G8929 Other chronic pain: Secondary | ICD-10-CM | POA: Diagnosis not present

## 2016-06-22 DIAGNOSIS — N186 End stage renal disease: Secondary | ICD-10-CM

## 2016-06-22 DIAGNOSIS — I132 Hypertensive heart and chronic kidney disease with heart failure and with stage 5 chronic kidney disease, or end stage renal disease: Secondary | ICD-10-CM | POA: Diagnosis not present

## 2016-06-22 DIAGNOSIS — Z992 Dependence on renal dialysis: Secondary | ICD-10-CM

## 2016-06-22 DIAGNOSIS — K219 Gastro-esophageal reflux disease without esophagitis: Secondary | ICD-10-CM | POA: Diagnosis not present

## 2016-06-22 DIAGNOSIS — I120 Hypertensive chronic kidney disease with stage 5 chronic kidney disease or end stage renal disease: Secondary | ICD-10-CM | POA: Diagnosis not present

## 2016-06-22 DIAGNOSIS — N19 Unspecified kidney failure: Secondary | ICD-10-CM | POA: Diagnosis present

## 2016-06-22 DIAGNOSIS — Z9115 Patient's noncompliance with renal dialysis: Secondary | ICD-10-CM | POA: Diagnosis not present

## 2016-06-22 DIAGNOSIS — E1122 Type 2 diabetes mellitus with diabetic chronic kidney disease: Secondary | ICD-10-CM | POA: Diagnosis not present

## 2016-06-22 DIAGNOSIS — M545 Low back pain: Secondary | ICD-10-CM

## 2016-06-22 DIAGNOSIS — E876 Hypokalemia: Secondary | ICD-10-CM | POA: Diagnosis not present

## 2016-06-22 DIAGNOSIS — T82898A Other specified complication of vascular prosthetic devices, implants and grafts, initial encounter: Secondary | ICD-10-CM

## 2016-06-22 DIAGNOSIS — N185 Chronic kidney disease, stage 5: Secondary | ICD-10-CM | POA: Diagnosis not present

## 2016-06-22 DIAGNOSIS — F4323 Adjustment disorder with mixed anxiety and depressed mood: Secondary | ICD-10-CM | POA: Diagnosis present

## 2016-06-22 DIAGNOSIS — I429 Cardiomyopathy, unspecified: Secondary | ICD-10-CM | POA: Diagnosis not present

## 2016-06-22 DIAGNOSIS — Z86718 Personal history of other venous thrombosis and embolism: Secondary | ICD-10-CM

## 2016-06-22 DIAGNOSIS — T8612 Kidney transplant failure: Secondary | ICD-10-CM | POA: Diagnosis not present

## 2016-06-22 DIAGNOSIS — N189 Chronic kidney disease, unspecified: Secondary | ICD-10-CM

## 2016-06-22 LAB — I-STAT CHEM 8, ED
BUN: 61 mg/dL — AB (ref 6–20)
CALCIUM ION: 0.96 mmol/L — AB (ref 1.15–1.40)
CHLORIDE: 104 mmol/L (ref 101–111)
Creatinine, Ser: 16 mg/dL — ABNORMAL HIGH (ref 0.61–1.24)
Glucose, Bld: 106 mg/dL — ABNORMAL HIGH (ref 65–99)
HCT: 33 % — ABNORMAL LOW (ref 39.0–52.0)
Hemoglobin: 11.2 g/dL — ABNORMAL LOW (ref 13.0–17.0)
POTASSIUM: 6.2 mmol/L — AB (ref 3.5–5.1)
SODIUM: 136 mmol/L (ref 135–145)
TCO2: 24 mmol/L (ref 0–100)

## 2016-06-22 MED ORDER — ATORVASTATIN CALCIUM 40 MG PO TABS
40.0000 mg | ORAL_TABLET | Freq: Every day | ORAL | Status: DC
  Administered 2016-06-23: 40 mg via ORAL
  Filled 2016-06-22: qty 1

## 2016-06-22 MED ORDER — HYDRALAZINE HCL 50 MG PO TABS
100.0000 mg | ORAL_TABLET | Freq: Two times a day (BID) | ORAL | Status: DC
  Administered 2016-06-22 – 2016-06-24 (×3): 100 mg via ORAL
  Filled 2016-06-22 (×3): qty 2

## 2016-06-22 MED ORDER — PREDNISONE 10 MG PO TABS
10.0000 mg | ORAL_TABLET | Freq: Every day | ORAL | Status: DC
  Administered 2016-06-23 – 2016-06-24 (×2): 10 mg via ORAL
  Filled 2016-06-22 (×2): qty 1

## 2016-06-22 MED ORDER — CINACALCET HCL 30 MG PO TABS
30.0000 mg | ORAL_TABLET | Freq: Every evening | ORAL | Status: DC
  Administered 2016-06-22 – 2016-06-23 (×2): 30 mg via ORAL
  Filled 2016-06-22 (×2): qty 1

## 2016-06-22 MED ORDER — ALBUTEROL SULFATE (2.5 MG/3ML) 0.083% IN NEBU: 2.5000 mg | INHALATION_SOLUTION | RESPIRATORY_TRACT | Status: DC | PRN

## 2016-06-22 MED ORDER — AMLODIPINE BESYLATE 10 MG PO TABS
10.0000 mg | ORAL_TABLET | Freq: Two times a day (BID) | ORAL | Status: DC
  Administered 2016-06-22 – 2016-06-24 (×3): 10 mg via ORAL
  Filled 2016-06-22: qty 1
  Filled 2016-06-22: qty 2

## 2016-06-22 MED ORDER — CARVEDILOL 25 MG PO TABS
25.0000 mg | ORAL_TABLET | Freq: Two times a day (BID) | ORAL | Status: DC
  Administered 2016-06-23 – 2016-06-24 (×2): 25 mg via ORAL
  Filled 2016-06-22 (×2): qty 1

## 2016-06-22 MED ORDER — ISOSORBIDE MONONITRATE ER 60 MG PO TB24
60.0000 mg | ORAL_TABLET | Freq: Every day | ORAL | Status: DC
  Administered 2016-06-23 – 2016-06-24 (×2): 60 mg via ORAL
  Filled 2016-06-22 (×2): qty 1

## 2016-06-22 MED ORDER — OXYCODONE-ACETAMINOPHEN 5-325 MG PO TABS
1.0000 | ORAL_TABLET | Freq: Once | ORAL | Status: AC
  Administered 2016-06-22: 1 via ORAL
  Filled 2016-06-22: qty 1

## 2016-06-22 MED ORDER — SEVELAMER CARBONATE 800 MG PO TABS
1600.0000 mg | ORAL_TABLET | Freq: Three times a day (TID) | ORAL | Status: DC
Start: 2016-06-23 — End: 2016-06-24
  Administered 2016-06-23 – 2016-06-24 (×4): 1600 mg via ORAL
  Filled 2016-06-22 (×5): qty 2

## 2016-06-22 MED ORDER — TRAMADOL HCL 50 MG PO TABS: 50.0000 mg | ORAL_TABLET | Freq: Four times a day (QID) | ORAL | Status: DC | PRN

## 2016-06-22 MED ORDER — DOXAZOSIN MESYLATE 2 MG PO TABS
2.0000 mg | ORAL_TABLET | Freq: Every day | ORAL | Status: DC
  Administered 2016-06-23 – 2016-06-24 (×2): 2 mg via ORAL
  Filled 2016-06-22 (×2): qty 1

## 2016-06-22 MED ORDER — SODIUM POLYSTYRENE SULFONATE 15 GM/60ML PO SUSP
15.0000 g | Freq: Once | ORAL | Status: DC
  Filled 2016-06-22: qty 60

## 2016-06-22 MED ORDER — ONDANSETRON 4 MG PO TBDP: 4.0000 mg | ORAL_TABLET | Freq: Three times a day (TID) | ORAL | Status: DC | PRN

## 2016-06-22 MED ORDER — CLONIDINE HCL 0.3 MG/24HR TD PTWK
0.3000 mg | MEDICATED_PATCH | TRANSDERMAL | Status: DC
  Filled 2016-06-22: qty 1

## 2016-06-22 MED ORDER — PANTOPRAZOLE SODIUM 40 MG PO TBEC
40.0000 mg | DELAYED_RELEASE_TABLET | Freq: Every day | ORAL | Status: DC
  Administered 2016-06-23 – 2016-06-24 (×2): 40 mg via ORAL
  Filled 2016-06-22 (×2): qty 1

## 2016-06-22 MED ORDER — ONDANSETRON HCL 4 MG PO TABS: 4.0000 mg | ORAL_TABLET | Freq: Four times a day (QID) | ORAL | Status: DC | PRN

## 2016-06-22 MED ORDER — ONDANSETRON HCL 4 MG/2ML IJ SOLN: 4.0000 mg | Freq: Four times a day (QID) | INTRAMUSCULAR | Status: DC | PRN

## 2016-06-22 MED ORDER — GABAPENTIN 100 MG PO CAPS
100.0000 mg | ORAL_CAPSULE | Freq: Three times a day (TID) | ORAL | Status: DC
  Administered 2016-06-22 – 2016-06-24 (×5): 100 mg via ORAL
  Filled 2016-06-22 (×4): qty 1

## 2016-06-22 MED ORDER — SODIUM CHLORIDE 0.9% FLUSH: 3.0000 mL | Freq: Two times a day (BID) | INTRAVENOUS | Status: DC

## 2016-06-22 MED ORDER — FAMOTIDINE 20 MG PO TABS
20.0000 mg | ORAL_TABLET | Freq: Every day | ORAL | Status: DC
  Administered 2016-06-23 – 2016-06-24 (×2): 20 mg via ORAL
  Filled 2016-06-22 (×2): qty 1

## 2016-06-22 MED ORDER — ALLOPURINOL 100 MG PO TABS
100.0000 mg | ORAL_TABLET | Freq: Every day | ORAL | Status: DC
  Administered 2016-06-23 – 2016-06-24 (×2): 100 mg via ORAL
  Filled 2016-06-22 (×2): qty 1

## 2016-06-22 MED ORDER — SODIUM CHLORIDE 0.9% FLUSH: 3.0000 mL | INTRAVENOUS | Status: DC | PRN

## 2016-06-22 MED ORDER — HEPARIN SODIUM (PORCINE) 1000 UNIT/ML DIALYSIS
5000.0000 [IU] | Freq: Once | INTRAMUSCULAR | Status: DC
  Filled 2016-06-22: qty 5

## 2016-06-22 MED ORDER — SODIUM CHLORIDE 0.9 % IV SOLN: 250.0000 mL | INTRAVENOUS | Status: DC | PRN

## 2016-06-22 MED ORDER — OXYCODONE-ACETAMINOPHEN 5-325 MG PO TABS
1.0000 | ORAL_TABLET | Freq: Four times a day (QID) | ORAL | Status: DC | PRN
  Administered 2016-06-22 – 2016-06-24 (×5): 1 via ORAL
  Filled 2016-06-22 (×4): qty 1

## 2016-06-22 MED ORDER — SODIUM CHLORIDE 0.9% FLUSH
3.0000 mL | Freq: Two times a day (BID) | INTRAVENOUS | Status: DC
  Administered 2016-06-23: 3 mL via INTRAVENOUS

## 2016-06-22 NOTE — ED Notes (Signed)
Pt refusing to be hooked to monitor or changed into gown. Pt stated he came for dialysis and feels there is no need for monitoring.

## 2016-06-22 NOTE — ED Notes (Signed)
Pt refused to be transported on the Tele monitor. Loma Sousa - RN aware.

## 2016-06-22 NOTE — ED Notes (Signed)
Placed Restricted band (pink) on pt's left wrist.

## 2016-06-22 NOTE — Progress Notes (Signed)
  [facility] Progress Note   Subjective: presented to ED requesting HD  Vitals:   06/22/16 0733 06/22/16 0736 06/22/16 0739  BP: (!) 185/104    Pulse: (!) 56    Resp: 20    Temp: 97.8 F (36.6 C)    TempSrc: Oral    SpO2: 100%    Weight:  79.2 kg (174 lb 9 oz) 77.8 kg (171 lb 9 oz)  Height:  _0  (1.88 m)     Inpatient medications: . oxyCODONE-acetaminophen  1 tablet Oral Once     Exam: Gen pale, pleasant, no distress Dry skin No jvd or bruits Chest clear bilat RRR no MRG Abd soft ntnd no ascites Ext no LE or UEedema Neuro is alert, Ox 3 , nf LUA AVF maturing/ R IJ cath    Dialysis:patient has no OP HD unit. Dry wt "160 lbs" per pt.     Assessment: 1. Hyperkalemia - low K bath  2. ESRD - has not outpatient HD unit currently, pt dependent on hospital HD unit for HD  3. Hx DVT/ PE - has had 2 episodes (1 LUL PE in 2015 felt to be HD-cath related, the other was RUE brachial/ basilic thrombosis 7169 w/o PE); possible that these episodes were all related to his chronic indwelling HD cath. If he ever gets a working fistula and HD cath is removed, would consider coumadin withdrawal.  4. NICM - EF 35-40%   5. Anemia - getting darbe here approx weekly, 100-150 ug, Hb up 11-12 range, so hold for now. 11/8 tsat was 22% but pt refused IV iron, has allergy to ferric gluconate but rec'd Feraheme x 1 in Aug '17 w/o incident 6. HTN on 5 bp meds 7. MBD - Ca ok, check phos/ pth  Plan - HD today, extended HD, dc home after HD   Kelly Splinter MD [facility] pager [phone]   06/22/2016, 11:21 AM    Recent Labs Lab 06/18/16 1843 06/19/16 0726 06/20/16 0736 06/22/16 1052  NA 134* 135 136 136  K 4.5 5.3* 5.4* 6.2*  CL 96* 96* 99* 104  CO2 _1 --   GLUCOSE 76 169* 131* 106*  BUN 14 20 31* 61*  CREATININE 6.27* 8.20* 10.73* 16.00*  CALCIUM 8.7* 8.8* 8.8*  --     Recent Labs Lab 06/17/16 0022  AST 17  ALT 9*  ALKPHOS  159*  BILITOT 0.6  PROT 7.5  ALBUMIN 3.4*    Recent Labs Lab 06/18/16 1843 06/19/16 0726 06/20/16 0736 06/22/16 1052  WBC 3.5* 3.6* 3.6*  --   NEUTROABS  --  2.0  --   --   HGB 11.2* 11.2* 11.1* 11.2*  HCT 35.2* 35.0* 35.3* 33.0*  MCV 90.3 90.4 90.7  --   PLT 172 211 197  --    Iron/TIBC/Ferritin/ %Sat    Component Value Date/Time   IRON 41 (L) 05/29/2016 1350   TIBC 190 (L) 05/29/2016 1350   FERRITIN 328 11/02/2015 0346   IRONPCTSAT 22 05/29/2016 1350

## 2016-06-22 NOTE — ED Notes (Signed)
Attempted report x 2

## 2016-06-22 NOTE — ED Notes (Signed)
Pt refusing to be hooked up to monitor or to change into gown.

## 2016-06-22 NOTE — ED Notes (Signed)
Attempted report x 1

## 2016-06-22 NOTE — ED Provider Notes (Signed)
[place] DEPT Provider Note   CSN: [id number] Arrival date & time: 06/22/16  7062     History   Chief Complaint Chief Complaint  Patient presents with  . Vascular Access Problem    HPI Frank [name] is a 50 y.o. male.  50 year old African-American male past medical history significant for end-stage renal disease currently on hemodialysis that presents to the ED today for his scheduled dialysis. Patient states that he is dialyzed every Tuesday, Thursday, Saturday. He came to the ED this past Thursday and they were too busy so he decided come back today. Patient states that he currently comes to the hospital for dialysis with plans to be enrolled in outpatient hemodialysis clinic in [place]. He has a left upper extremity fistula that they are waiting to mature. Patient endorses 5 lbs weight gain over the past week. Patient states that he has chronic low back pain and is requesting his home dose of pain medicine at this time. Patient denies any new trauma. Patient is followed by a neurosurgeon for his back pain. He denies any urinary retention, loss of bowel or bladder, saddle paresthesias, numbness/tingling, fevers, history of IV drug use, history of cancer. He denies and fever, chills, headache, vision changes, lightheadedness, dizziness, chest pain, shortness of breath, abdominal pain, nausea, emesis, urinary symptoms, change in bowel habits, numbness/tingling.        Past Medical History:  Diagnosis Date  . Anemia   . Anxiety   . Chronic combined systolic and diastolic CHF (congestive heart failure) (HCC)    a. EF 20-25% by echo in 08/2015 b. echo 10/2015: EF 35-40%, diffuse HK, severe LAE, moderate RAE, small pericardial effusion  . Complication of anesthesia    itching, sore throat  . Depression   . Dialysis patient ([place])   . ESRD (end stage renal disease) ([place])    due to HTN per patient, followed at [facility], s/p failed kidney transplant - dialysis Tue, Th, Sat  .  Hyperkalemia 12/2015  . Hypertension   . Junctional rhythm    a. noted in 08/2015: hyperkalemic at that time  b. 12/2015: presented in junctional rhythm w/ K+ of 6.6. Resolved with improvement of K+ levels.  . Nonischemic cardiomyopathy ([place])    a. 08/2014: cath showing minimal CAD, but tortuous arteries noted.   . Personal history of DVT (deep vein thrombosis)/ PE 05/26/2016   In Oct 2015 had small subsemental LUL PE w/o DVT (LE dopplers neg) and was felt to be HD cath related, treated w coumadin.  IN May 2016 had small vein DVT (acute/subacute) in the R basilic/ brachial veins of the RUE, resumed on coumadin.  Had R sided HD cath at that time.    . Renal insufficiency   . Shortness of breath   . Type II diabetes mellitus (HCC)    No history per patient, but remains under history as A1c would not be accurate given on dialysis    Patient Active Problem List   Diagnosis Date Noted  . ESRD (end stage renal disease) ([place]) 06/20/2016  . ESRD needing dialysis ([place]) 06/14/2016  . Uremia 06/07/2016  . Hyperkalemia 05/29/2016  . Chronic back pain 05/29/2016  . GERD (gastroesophageal reflux disease) 05/29/2016  . Personal history of DVT (deep vein thrombosis)/ PE 05/26/2016  . Bradycardia 03/31/2016  . Nonischemic cardiomyopathy ([place]) 01/09/2016  . Bilateral low back pain without sciatica   . Left renal mass 10/30/2015  . Constipation 10/30/2015  . Adjustment disorder with mixed anxiety and  depressed mood 08/20/2015  . Chronic epididymitis 06/19/2015  . Groin pain, chronic, right 06/19/2015  . Incarcerated right inguinal hernia 02/16/2015  . Essential hypertension 01/02/2015  . Dyslipidemia   . ESRD on hemodialysis ([place])   . Anemia of chronic kidney failure 06/24/2013    Past Surgical History:  Procedure Laterality Date  . CAPD INSERTION    . CAPD REMOVAL    . INGUINAL HERNIA REPAIR Right 02/14/2015   Procedure: REPAIR INCARCERATED RIGHT INGUINAL HERNIA;  Surgeon: [name], MD;   Location: [place];  Service: General;  Laterality: Right;  . INSERTION OF DIALYSIS CATHETER Right 09/23/2015   Procedure: exchange of Right internal Dialysis Catheter.;  Surgeon: [name], MD;  Location: [place];  Service: Vascular;  Laterality: Right;  . KIDNEY RECEIPIENT  2006   failed and started HD in March 2014  . LEFT HEART CATHETERIZATION WITH CORONARY ANGIOGRAM N/A 09/02/2014   Procedure: LEFT HEART CATHETERIZATION WITH CORONARY ANGIOGRAM;  Surgeon: [name], MD;  Location: [facility] CATH LAB;  Service: Cardiovascular;  Laterality: N/A;       Home Medications    Prior to Admission medications   Medication Sig Start Date End Date Taking? Authorizing Provider  allopurinol (ZYLOPRIM) 100 MG tablet Take 100 mg by mouth daily.   Yes Historical Provider, MD  amLODipine (NORVASC) 10 MG tablet Take 10 mg by mouth 2 (two) times daily.    Yes Historical Provider, MD  atorvastatin (LIPITOR) 40 MG tablet Take 1 tablet (40 mg total) by mouth daily at 6 PM. 09/02/14  Yes Barton Dubois, MD  carvedilol (COREG) 25 MG tablet Take 25 mg by mouth 2 (two) times daily with a meal.   Yes Historical Provider, MD  cinacalcet (SENSIPAR) 30 MG tablet Take 30 mg by mouth every evening.    Yes Historical Provider, MD  cloNIDine (CATAPRES - DOSED IN MG/24 HR) 0.3 mg/24hr patch Place 0.3 mg onto the skin once a week.   Yes Historical Provider, MD  doxazosin (CARDURA) 2 MG tablet Take 2 mg by mouth daily.   Yes Historical Provider, MD  famotidine (PEPCID) 20 MG tablet Take 1 tablet (20 mg total) by mouth 2 (two) times daily. 02/13/16  Yes Charlesetta Shanks, MD  gabapentin (NEURONTIN) 100 MG capsule Take 1 capsule (100 mg total) by mouth at bedtime. Patient taking differently: Take 100 mg by mouth 3 (three) times daily.  05/21/16  Yes [name], DO  hydrALAZINE (APRESOLINE) 100 MG tablet Take 100 mg by mouth 2 (two) times daily.    Yes Historical Provider, MD  isosorbide mononitrate (IMDUR) 60 MG 24 hr tablet Take  1 tablet (60 mg total) by mouth daily. 01/09/16  Yes Smiley Houseman, MD  omeprazole (PRILOSEC) 20 MG capsule Take 20 mg by mouth daily. 07/01/13  Yes Historical Provider, MD  ondansetron (ZOFRAN ODT) 4 MG disintegrating tablet Take 1 tablet (4 mg total) by mouth every 8 (eight) hours as needed for nausea or vomiting. 05/16/16  Yes Sherwood Gambler, MD  oxycodone (OXY-IR) 5 MG capsule Take 1 capsule (5 mg total) by mouth every 4 (four) hours as needed for pain. 05/30/16  Yes [name], MD  oxyCODONE-acetaminophen (PERCOCET/ROXICET) 5-325 MG tablet Take 1 tablet by mouth every 6 (six) hours as needed for severe pain.   Yes Historical Provider, MD  predniSONE (DELTASONE) 10 MG tablet Take 10 mg by mouth daily with breakfast.   Yes Historical Provider, MD  sevelamer carbonate (RENVELA) 800 MG  tablet Take 1,600 mg by mouth 3 (three) times daily with meals.    Yes Historical Provider, MD  sulfamethoxazole-trimethoprim (BACTRIM,SEPTRA) 400-80 MG tablet Take 1 tablet by mouth 3 (three) times a week.   Yes Historical Provider, MD  warfarin (COUMADIN) 2 MG tablet Take 2 mg by mouth daily. Take with 100m tablet to equal 760m7/12/17  Yes Historical Provider, MD  warfarin (COUMADIN) 5 MG tablet Take 5 mg by mouth See admin instructions. Take 1 tablet (5 mg) by mouth with a 2 mg tablet for a 7 mg dose 01/31/16  Yes Historical Provider, MD    Family History Family History  Problem Relation Age of Onset  . Hypertension Other     Social History Social History  Substance Use Topics  . Smoking status: Former Smoker    Packs/day: 0.00    Years: 1.00    Types: Cigarettes  . Smokeless tobacco: Never Used     Comment: quit Jan 2014  . Alcohol use No     Allergies   Butalbital-apap-caffeine; Ferrlecit [na ferric gluc cplx in sucrose]; Minoxidil; and Darvocet [propoxyphene n-acetaminophen]   Review of Systems Review of Systems  Constitutional: Positive for unexpected weight change. Negative for chills  and fever.  Respiratory: Negative for cough and shortness of breath.   Cardiovascular: Negative for chest pain, palpitations and leg swelling.  Gastrointestinal: Negative for abdominal pain, diarrhea, nausea and vomiting.  Genitourinary: Negative for dysuria, flank pain, frequency, hematuria and urgency.  Musculoskeletal: Positive for back pain (chronic low back pain). Negative for myalgias and neck pain.  Neurological: Negative for dizziness, syncope, weakness, light-headedness, numbness and headaches.  All other systems reviewed and are negative.    Physical Exam Updated Vital Signs BP (!) 185/104 (BP Location: Left Arm)   Pulse (!) 56   Temp 97.8 F (36.6 C) (Oral)   Resp 20   Ht _0  (1.88 m)   Wt 77.8 kg   SpO2 100%   BMI 22.03 kg/m   Physical Exam  Constitutional: He is oriented to person, place, and time. He appears well-developed and well-nourished. No distress.  HENT:  Head: Normocephalic and atraumatic.  Mouth/Throat: Oropharynx is clear and moist.  Eyes: Conjunctivae are normal. Right eye exhibits no discharge. Left eye exhibits no discharge. No scleral icterus.  Neck: Normal range of motion. Neck supple. No thyromegaly present.  Cardiovascular: Normal rate, regular rhythm, normal heart sounds and intact distal pulses.  Exam reveals no gallop and no friction rub.   No murmur heard. Pulmonary/Chest: Effort normal and breath sounds normal. No tachypnea. No respiratory distress. He has no decreased breath sounds.  Abdominal: Soft. Bowel sounds are normal. He exhibits no distension. There is no tenderness. There is no rebound and no guarding.  No CVA tenderness.  Musculoskeletal: Normal range of motion. He exhibits no edema, tenderness or deformity.  Patient with L spine paraspinal tenderness. No midline T spine or Lspine tenderness. No deformity or step offs noted. Full ROM. Patient is ambulatory.   Lymphadenopathy:    He has no cervical adenopathy.  Neurological: He  is alert and oriented to person, place, and time.  Strength 5 out of 5 in lower extremities. Sensation intact. Moving all four extremities without any difficulties.  Skin: Skin is warm and dry. Capillary refill takes less than 2 seconds.  Nursing note and vitals reviewed.    ED Treatments / Results  Labs (all labs ordered are listed, but only abnormal results are displayed) Labs Reviewed  I-STAT CHEM 8, ED - Abnormal; Notable for the following:       Result Value   Potassium 6.2 (*)    BUN 61 (*)    Creatinine, Ser 16.00 (*)    Glucose, Bld 106 (*)    Calcium, Ion 0.96 (*)    Hemoglobin 11.2 (*)    HCT 33.0 (*)    All other components within normal limits    EKG  EKG Interpretation None       Radiology No results found.  Procedures Procedures (including critical care time)  Medications Ordered in ED Medications  oxyCODONE-acetaminophen (PERCOCET/ROXICET) 5-325 MG per tablet 1 tablet (not administered)     Initial Impression / Assessment and Plan / ED Course  I have reviewed the triage vital signs and the nursing notes.  Pertinent labs & imaging results that were available during my care of the patient were reviewed by me and considered in my medical decision making (see chart for details).  Clinical Course   Patient presents to the ED today for scheduled dialysis. Patient states that his last treatment was on Tuesday. He is currently asymptomatic. He denies any chest pain, shortness of breath, abdominal pain. Physical exam is unremarkable. Patient is in no acute distress. He complains of chronic low back pain that is unchanged in nature. He asked for his home dose of pain medicine. He is non-toxic appearing. His vital signs are stable and he is afebrile. His is hypertensive in Ed without history of same and denies any ha, vision changes, cp, or sob. Chem 8 shows hyperkalemia at 6.2. I spoke with Dr. Jonnie Finner with nephrology who agrees to dialyze patient today. Patient  was seen and examined by Dr. Rex Kras who agrees with the above plan. Patient states that he is one to leave AMA. He is tired sitting in the ED and he has "played this game before." I would like to order EKG for patient but he states he is leaving. I have advised patient's of the risk of leaving AMA including cardiac arrest. Patient is agreeable and understands these risks. He still insists on leaving AMA. AMA forms were signed prior to leaving. Patient is ambulatory in no acute distress at this time. I have thoroughly advised patient to stay to be dialyzed and he continues to refuse. Dr. Rex Kras is aware of dispo.    Final Clinical Impressions(s) / ED Diagnoses   Final diagnoses:  Hyperkalemia  Chronic bilateral low back pain without sciatica    New Prescriptions New Prescriptions   No medications on file     Doristine Devoid, PA-C 06/22/16 [place], MD 06/23/16 [phone]

## 2016-06-22 NOTE — ED Triage Notes (Signed)
Pt. Stated, I have to come here for dialysis, I have to come here until my fistula heals which has been 2 months ago.

## 2016-06-22 NOTE — ED Provider Notes (Signed)
[place] DEPT Provider Note   CSN: [id number] Arrival date & time: 06/22/16  1803     History   Chief Complaint Chief Complaint  Patient presents with  . Vascular Access Problem    HPI TRU RANA is a 50 y.o. male.  HPI   50 year old male with past medical history of end-stage renal disease currently on Tuesday Thursday Saturday dialysis presents today requesting to be dialyzed. Patient's last dialysis was 4 days ago. He mentioned having a 5 pound weight gain over the past week. He was seen in the ER requesting for dialysis earlier today but left AGAINST MEDICAL ADVICE. He is now returning is requesting for dialysis again. Patient currently denies having fever, chills, headache, lightheadedness, dizziness, chest pain, short of breath, abdominal pain, nausea vomiting diarrhea. I have reviewed his recent ER visit.     Past Medical History:  Diagnosis Date  . Anemia   . Anxiety   . Chronic combined systolic and diastolic CHF (congestive heart failure) (HCC)    a. EF 20-25% by echo in 08/2015 b. echo 10/2015: EF 35-40%, diffuse HK, severe LAE, moderate RAE, small pericardial effusion  . Complication of anesthesia    itching, sore throat  . Depression   . Dialysis patient ([place])   . ESRD (end stage renal disease) ([place])    due to HTN per patient, followed at [facility], s/p failed kidney transplant - dialysis Tue, Th, Sat  . Hyperkalemia 12/2015  . Hypertension   . Junctional rhythm    a. noted in 08/2015: hyperkalemic at that time  b. 12/2015: presented in junctional rhythm w/ K+ of 6.6. Resolved with improvement of K+ levels.  . Nonischemic cardiomyopathy ([place])    a. 08/2014: cath showing minimal CAD, but tortuous arteries noted.   . Personal history of DVT (deep vein thrombosis)/ PE 05/26/2016   In Oct 2015 had small subsemental LUL PE w/o DVT (LE dopplers neg) and was felt to be HD cath related, treated w coumadin.  IN May 2016 had small vein DVT (acute/subacute) in the R  basilic/ brachial veins of the RUE, resumed on coumadin.  Had R sided HD cath at that time.    . Renal insufficiency   . Shortness of breath   . Type II diabetes mellitus (HCC)    No history per patient, but remains under history as A1c would not be accurate given on dialysis    Patient Active Problem List   Diagnosis Date Noted  . ESRD (end stage renal disease) ([place]) 06/20/2016  . ESRD needing dialysis ([place]) 06/14/2016  . Uremia 06/07/2016  . Hyperkalemia 05/29/2016  . Chronic back pain 05/29/2016  . GERD (gastroesophageal reflux disease) 05/29/2016  . Personal history of DVT (deep vein thrombosis)/ PE 05/26/2016  . Bradycardia 03/31/2016  . Nonischemic cardiomyopathy ([place]) 01/09/2016  . Bilateral low back pain without sciatica   . Left renal mass 10/30/2015  . Constipation 10/30/2015  . Adjustment disorder with mixed anxiety and depressed mood 08/20/2015  . Chronic epididymitis 06/19/2015  . Groin pain, chronic, right 06/19/2015  . Incarcerated right inguinal hernia 02/16/2015  . Essential hypertension 01/02/2015  . Dyslipidemia   . ESRD on hemodialysis ([place])   . Anemia of chronic kidney failure 06/24/2013    Past Surgical History:  Procedure Laterality Date  . CAPD INSERTION    . CAPD REMOVAL    . INGUINAL HERNIA REPAIR Right 02/14/2015   Procedure: REPAIR INCARCERATED RIGHT INGUINAL HERNIA;  Surgeon: [name], MD;  Location:  [place] OR;  Service: General;  Laterality: Right;  . INSERTION OF DIALYSIS CATHETER Right 09/23/2015   Procedure: exchange of Right internal Dialysis Catheter.;  Surgeon: [name], MD;  Location: [place];  Service: Vascular;  Laterality: Right;  . KIDNEY RECEIPIENT  2006   failed and started HD in March 2014  . LEFT HEART CATHETERIZATION WITH CORONARY ANGIOGRAM N/A 09/02/2014   Procedure: LEFT HEART CATHETERIZATION WITH CORONARY ANGIOGRAM;  Surgeon: [name], MD;  Location: [facility] C[facility] LAB;  Service: Cardiovascular;  Laterality: N/A;        Home Medications    Prior to Admission medications   Medication Sig Start Date End Date Taking? Authorizing Provider  allopurinol (ZYLOPRIM) 100 MG tablet Take 100 mg by mouth daily.    Historical Provider, MD  amLODipine (NORVASC) 10 MG tablet Take 10 mg by mouth 2 (two) times daily.     Historical Provider, MD  atorvastatin (LIPITOR) 40 MG tablet Take 1 tablet (40 mg total) by mouth daily at 6 PM. 09/02/14   Barton Dubois, MD  carvedilol (COREG) 25 MG tablet Take 25 mg by mouth 2 (two) times daily with a meal.    Historical Provider, MD  cinacalcet (SENSIPAR) 30 MG tablet Take 30 mg by mouth every evening.     Historical Provider, MD  cloNIDine (CATAPRES - DOSED IN MG/24 HR) 0.3 mg/24hr patch Place 0.3 mg onto the skin once a week.    Historical Provider, MD  doxazosin (CARDURA) 2 MG tablet Take 2 mg by mouth daily.    Historical Provider, MD  famotidine (PEPCID) 20 MG tablet Take 1 tablet (20 mg total) by mouth 2 (two) times daily. 02/13/16   Charlesetta Shanks, MD  gabapentin (NEURONTIN) 100 MG capsule Take 1 capsule (100 mg total) by mouth at bedtime. Patient taking differently: Take 100 mg by mouth 3 (three) times daily.  05/21/16   [name], DO  hydrALAZINE (APRESOLINE) 100 MG tablet Take 100 mg by mouth 2 (two) times daily.     Historical Provider, MD  isosorbide mononitrate (IMDUR) 60 MG 24 hr tablet Take 1 tablet (60 mg total) by mouth daily. 01/09/16   Smiley Houseman, MD  omeprazole (PRILOSEC) 20 MG capsule Take 20 mg by mouth daily. 07/01/13   Historical Provider, MD  ondansetron (ZOFRAN ODT) 4 MG disintegrating tablet Take 1 tablet (4 mg total) by mouth every 8 (eight) hours as needed for nausea or vomiting. 05/16/16   Sherwood Gambler, MD  oxycodone (OXY-IR) 5 MG capsule Take 1 capsule (5 mg total) by mouth every 4 (four) hours as needed for pain. 05/30/16   [name], MD  oxyCODONE-acetaminophen (PERCOCET/ROXICET) 5-325 MG tablet Take 1 tablet by mouth every 6 (six)  hours as needed for severe pain.    Historical Provider, MD  predniSONE (DELTASONE) 10 MG tablet Take 10 mg by mouth daily with breakfast.    Historical Provider, MD  sevelamer carbonate (RENVELA) 800 MG tablet Take 1,600 mg by mouth 3 (three) times daily with meals.     Historical Provider, MD  sulfamethoxazole-trimethoprim (BACTRIM,SEPTRA) 400-80 MG tablet Take 1 tablet by mouth 3 (three) times a week.    Historical Provider, MD  warfarin (COUMADIN) 2 MG tablet Take 2 mg by mouth daily. Take with 14m tablet to equal 752m7/12/17   Historical Provider, MD  warfarin (COUMADIN) 5 MG tablet Take 5 mg by mouth See admin instructions. Take 1 tablet (5 mg) by mouth with a 2  mg tablet for a 7 mg dose 01/31/16   Historical Provider, MD    Family History Family History  Problem Relation Age of Onset  . Hypertension Other     Social History Social History  Substance Use Topics  . Smoking status: Former Smoker    Packs/day: 0.00    Years: 1.00    Types: Cigarettes  . Smokeless tobacco: Never Used     Comment: quit Jan 2014  . Alcohol use No     Allergies   Butalbital-apap-caffeine; Ferrlecit [na ferric gluc cplx in sucrose]; Minoxidil; and Darvocet [propoxyphene n-acetaminophen]   Review of Systems Review of Systems  All other systems reviewed and are negative.    Physical Exam Updated Vital Signs There were no vitals taken for this visit.  Physical Exam  Constitutional: He appears well-developed and well-nourished. No distress.  HENT:  Head: Atraumatic.  Eyes: Conjunctivae are normal.  Neck: Neck supple.  Neurological: He is alert.  Skin: No rash noted.  Psychiatric: He has a normal mood and affect.  Nursing note and vitals reviewed.    ED Treatments / Results  Labs (all labs ordered are listed, but only abnormal results are displayed) Labs Reviewed - No data to display I-stat Chem 8, ED  Order: [id number]  Status:  Final result Visible to patient:  No (Not Released)  Next appt:  None   Ref Range & Units 10:52  Sodium 135 - 145 mmol/L 136   Potassium 3.5 - 5.1 mmol/L 6.2    Chloride 101 - 111 mmol/L 104   BUN 6 - 20 mg/dL 61    Creatinine, Ser 0.61 - 1.24 mg/dL 16.00    Glucose, Bld 65 - 99 mg/dL 106    Calcium, Ion 1.15 - 1.40 mmol/L 0.96    TCO2 0 - 100 mmol/L 24   Hemoglobin 13.0 - 17.0 g/dL 11.2    HCT 39.0 - 52.0 % 33.0    Resulting Agency  [facility]    Specimen Collected: 06/22/16 10:52          EKG  EKG Interpretation None       Radiology No results found.  Procedures Procedures (including critical care time)  Medications Ordered in ED Medications - No data to display   Initial Impression / Assessment and Plan / ED Course  I have reviewed the triage vital signs and the nursing notes.  Pertinent labs & imaging results that were available during my care of the patient were reviewed by me and considered in my medical decision making (see chart for details).  Clinical Course     BP (!) 187/104 (BP Location: Right Arm)   Pulse 67   Temp 98 F (36.7 C) (Oral)   Resp 18   SpO2 94%    Final Clinical Impressions(s) / ED Diagnoses   Final diagnoses:  ESRD (end stage renal disease) on dialysis [facility])  Patient's noncompliance with renal dialysis [facility])    New Prescriptions Current Discharge Medication List     8:17 PM Patient is a Tuesday Thursday Saturday dialysis , who has not had this diagnosis since Tuesday. He was seen earlier in the day requesting for dialysis. Labs remarkable for a K+ 6.2.  The previous ER provider did reached out to oncall nephrologist, Dr. Melvia Heaps, who agrees that pt will need dialysis.    8:43 PM Pt currently refuse to give any blood for labs.  I have consulted [facility], Dr. Myna Hidalgo who agrees to admit pt to telemetry bed,  under observation status to have pt dialyzed.     [name], PA-C 06/22/16 2154    Margette Fast, MD 06/23/16 [phone]

## 2016-06-22 NOTE — H&P (Signed)
History and Physical    Frank [name] QMG:[id number] DOB: 12-15-1965 DOA: 06/22/2016  PCP: Angelica Chessman, MD   Patient coming from: Home  Chief Complaint: Requesting dialysis  HPI: Frank [name] is a 50 y.o. male with medical history significant for end-stage renal disease on dialysis, hypertension, nonischemic cardiomyopathy, and history of PE and DVT on warfarin who presents to the emergency department to request hemodialysis. Patient reports that he is dependent on the hospital for his dialysis and was last dialyzed on 06/18/2016. He denies any significant dyspnea, cough, chest pain, or palpitations. He complains of generalized pruritus, but was otherwise asymptomatic. He denies recent fevers or chills. He reports recent placement of an AV fistula that has not yet matured and has been receiving dialysis through a catheter in the right chest. Patient was evaluated in the emergency department under similar circumstances 2 days ago, but left AMA prior to dialysis. He returned this morning to again request dialysis, but felt that he was taking too long and left. He now returns with no new complaints, again requesting dialysis.    ED Course: Upon arrival to the ED, patient is found to be afebrile, saturating adequately on room air, bradycardic in the mid 50s, and hypertensive to the 180/100 range. Patient refused EKG and repeat vitals. Chemistries were notable for a potassium of 6.2 and H&H appears stable at 11.2 and 33.0, respectively. Nephrology was consulted by the ED physician and agreed to inpatient dialysis. Patient continued to appear well and the emergency department with no apparent respiratory distress and no new complaints, but refused to cooperate with cardiac monitoring and vital signs. He'll be observed on the telemetry unit for inpatient hemodialysis and monitoring of his potassium until HD can be performed.  Review of Systems:  All other systems reviewed and apart from HPI, are  negative.  Past Medical History:  Diagnosis Date  . Anemia   . Anxiety   . Chronic combined systolic and diastolic CHF (congestive heart failure) (HCC)    a. EF 20-25% by echo in 08/2015 b. echo 10/2015: EF 35-40%, diffuse HK, severe LAE, moderate RAE, small pericardial effusion  . Complication of anesthesia    itching, sore throat  . Depression   . Dialysis patient ([place])   . ESRD (end stage renal disease) ([place])    due to HTN per patient, followed at [facility], s/p failed kidney transplant - dialysis Tue, Th, Sat  . Hyperkalemia 12/2015  . Hypertension   . Junctional rhythm    a. noted in 08/2015: hyperkalemic at that time  b. 12/2015: presented in junctional rhythm w/ K+ of 6.6. Resolved with improvement of K+ levels.  . Nonischemic cardiomyopathy ([place])    a. 08/2014: cath showing minimal CAD, but tortuous arteries noted.   . Personal history of DVT (deep vein thrombosis)/ PE 05/26/2016   In Oct 2015 had small subsemental LUL PE w/o DVT (LE dopplers neg) and was felt to be HD cath related, treated w coumadin.  IN May 2016 had small vein DVT (acute/subacute) in the R basilic/ brachial veins of the RUE, resumed on coumadin.  Had R sided HD cath at that time.    . Renal insufficiency   . Shortness of breath   . Type II diabetes mellitus (HCC)    No history per patient, but remains under history as A1c would not be accurate given on dialysis    Past Surgical History:  Procedure Laterality Date  . CAPD INSERTION    .  CAPD REMOVAL    . INGUINAL HERNIA REPAIR Right 02/14/2015   Procedure: REPAIR INCARCERATED RIGHT INGUINAL HERNIA;  Surgeon: [name], MD;  Location: [facility];  Service: General;  Laterality: Right;  . INSERTION OF DIALYSIS CATHETER Right 09/23/2015   Procedure: exchange of Right internal Dialysis Catheter.;  Surgeon: [name], MD;  Location: [facility];  Service: Vascular;  Laterality: Right;  . KIDNEY RECEIPIENT  2006   failed and started HD in March 2014  . LEFT HEART  CATHETERIZATION WITH CORONARY ANGIOGRAM N/A 09/02/2014   Procedure: LEFT HEART CATHETERIZATION WITH CORONARY ANGIOGRAM;  Surgeon: [name], MD;  Location: [facility] CATH LAB;  Service: Cardiovascular;  Laterality: N/A;     reports that he has quit smoking. His smoking use included Cigarettes. He smoked 0.00 packs per day for 1.00 year. He has never used smokeless tobacco. He reports that he does not drink alcohol or use drugs.  Allergies  Allergen Reactions  . Butalbital-Apap-Caffeine Shortness Of Breath, Swelling and Other (See Comments)    Swelling in throat  . Ferrlecit [Na Ferric Gluc Cplx In Sucrose] Shortness Of Breath, Swelling and Other (See Comments)    Swelling in throat  . Minoxidil Shortness Of Breath  . Darvocet [Propoxyphene N-Acetaminophen] Hives    Family History  Problem Relation Age of Onset  . Hypertension Other      Prior to Admission medications   Medication Sig Start Date End Date Taking? Authorizing Provider  allopurinol (ZYLOPRIM) 100 MG tablet Take 100 mg by mouth daily.    Historical Provider, MD  amLODipine (NORVASC) 10 MG tablet Take 10 mg by mouth 2 (two) times daily.     Historical Provider, MD  atorvastatin (LIPITOR) 40 MG tablet Take 1 tablet (40 mg total) by mouth daily at 6 PM. 09/02/14   Barton Dubois, MD  carvedilol (COREG) 25 MG tablet Take 25 mg by mouth 2 (two) times daily with a meal.    Historical Provider, MD  cinacalcet (SENSIPAR) 30 MG tablet Take 30 mg by mouth every evening.     Historical Provider, MD  cloNIDine (CATAPRES - DOSED IN MG/24 HR) 0.3 mg/24hr patch Place 0.3 mg onto the skin once a week.    Historical Provider, MD  doxazosin (CARDURA) 2 MG tablet Take 2 mg by mouth daily.    Historical Provider, MD  famotidine (PEPCID) 20 MG tablet Take 1 tablet (20 mg total) by mouth 2 (two) times daily. 02/13/16   Charlesetta Shanks, MD  gabapentin (NEURONTIN) 100 MG capsule Take 1 capsule (100 mg total) by mouth at bedtime. Patient taking  differently: Take 100 mg by mouth 3 (three) times daily.  05/21/16   [name], DO  hydrALAZINE (APRESOLINE) 100 MG tablet Take 100 mg by mouth 2 (two) times daily.     Historical Provider, MD  isosorbide mononitrate (IMDUR) 60 MG 24 hr tablet Take 1 tablet (60 mg total) by mouth daily. 01/09/16   Smiley Houseman, MD  omeprazole (PRILOSEC) 20 MG capsule Take 20 mg by mouth daily. 07/01/13   Historical Provider, MD  ondansetron (ZOFRAN ODT) 4 MG disintegrating tablet Take 1 tablet (4 mg total) by mouth every 8 (eight) hours as needed for nausea or vomiting. 05/16/16   Sherwood Gambler, MD  oxycodone (OXY-IR) 5 MG capsule Take 1 capsule (5 mg total) by mouth every 4 (four) hours as needed for pain. 05/30/16   [name], MD  oxyCODONE-acetaminophen (PERCOCET/ROXICET) 5-325 MG tablet Take 1 tablet  by mouth every 6 (six) hours as needed for severe pain.    Historical Provider, MD  predniSONE (DELTASONE) 10 MG tablet Take 10 mg by mouth daily with breakfast.    Historical Provider, MD  sevelamer carbonate (RENVELA) 800 MG tablet Take 1,600 mg by mouth 3 (three) times daily with meals.     Historical Provider, MD  sulfamethoxazole-trimethoprim (BACTRIM,SEPTRA) 400-80 MG tablet Take 1 tablet by mouth 3 (three) times a week.    Historical Provider, MD  warfarin (COUMADIN) 2 MG tablet Take 2 mg by mouth daily. Take with 66m tablet to equal 7104m7/12/17   Historical Provider, MD  warfarin (COUMADIN) 5 MG tablet Take 5 mg by mouth See admin instructions. Take 1 tablet (5 mg) by mouth with a 2 mg tablet for a 7 mg dose 01/31/16   Historical Provider, MD    Physical Exam: There were no vitals filed for this visit.    Constitutional: NAD, calm, comfortable Eyes: PERTLA, lids and conjunctivae normal ENMT: Mucous membranes are moist. Posterior pharynx clear of any exudate or lesions.   Neck: normal, supple, no masses, no thyromegaly Respiratory: Crackles at right base, no wheeze or rhonchi. Normal  respiratory effort. No accessory muscle use.  Cardiovascular: S1 & S2 heard, regular rate and rhythm, grade 3 systolic murmur at LSB and apex. Moderate NVD. Abdomen: No distension, no tenderness, no masses palpated. Bowel sounds normal.  Musculoskeletal: no clubbing / cyanosis. No joint deformity upper and lower extremities. Normal muscle tone.  Skin: no significant rashes, lesions, ulcers. Warm, dry, well-perfused. Neurologic: CN 2-12 grossly intact. Sensation intact, DTR normal. Strength 5/5 in all 4 limbs.  Psychiatric: Alert and oriented x 3. Poor cooperation with interview and exam    Labs on Admission: I have personally reviewed following labs and imaging studies  CBC:  Recent Labs Lab 06/17/16 0022  06/18/16 0422 06/18/16 1843 06/19/16 0726 06/20/16 0736 06/22/16 1052  WBC 4.2  --  3.1* 3.5* 3.6* 3.6*  --   NEUTROABS  --   --   --   --  2.0  --   --   HGB 9.5*  < > 10.0* 11.2* 11.2* 11.1* 11.2*  HCT 29.9*  < > 31.8* 35.2* 35.0* 35.3* 33.0*  MCV 89.5  --  89.6 90.3 90.4 90.7  --   PLT 240  --  190 172 211 197  --   < > = values in this interval not displayed. Basic Metabolic Panel:  Recent Labs Lab 06/17/16 0022  06/18/16 0422 06/18/16 1843 06/19/16 0726 06/20/16 0736 06/22/16 1052  NA 139  < > 136 134* 135 136 136  K >7.5*  < > 5.7* 4.5 5.3* 5.4* 6.2*  CL 102  < > 101 96* 96* 99* 104  CO2 23  --  _0 --   GLUCOSE 70  < > 86 76 169* 131* 106*  BUN 65*  < > 27* 14 20 31* 61*  CREATININE 17.15*  < > 9.42* 6.27* 8.20* 10.73* 16.00*  CALCIUM 8.1*  --  8.8* 8.7* 8.8* 8.8*  --   < > = values in this interval not displayed. GFR: Estimated Creatinine Clearance: 5.9 mL/min (by C-G formula based on SCr of 16 mg/dL (H)). Liver Function Tests:  Recent Labs Lab 06/17/16 0022  AST 17  ALT 9*  ALKPHOS 159*  BILITOT 0.6  PROT 7.5  ALBUMIN 3.4*    Recent Labs Lab 06/17/16 0022  LIPASE 39  No results for input(s): AMMONIA in the last 168  hours. Coagulation Profile:  Recent Labs Lab 06/17/16 1055 06/18/16 0422 06/19/16 0726  INR 1.01 1.11 1.20   Cardiac Enzymes:  Recent Labs Lab 06/19/16 0726  TROPONINI 0.03*   BNP (last 3 results) No results for input(s): PROBNP in the last 8760 hours. HbA1C: No results for input(s): HGBA1C in the last 72 hours. CBG:  Recent Labs Lab 06/17/16 0338 06/17/16 0445  GLUCAP 101* 70   Lipid Profile: No results for input(s): CHOL, HDL, LDLCALC, TRIG, CHOLHDL, LDLDIRECT in the last 72 hours. Thyroid Function Tests: No results for input(s): TSH, T4TOTAL, FREET4, T3FREE, THYROIDAB in the last 72 hours. Anemia Panel: No results for input(s): VITAMINB12, FOLATE, FERRITIN, TIBC, IRON, RETICCTPCT in the last 72 hours. Urine analysis:    Component Value Date/Time   COLORURINE YELLOW 10/18/2013 0419   APPEARANCEUR CLEAR 10/18/2013 0419   LABSPEC 1.008 10/18/2013 0419   PHURINE 8.5 (H) 10/18/2013 0419   GLUCOSEU 100 (A) 10/18/2013 0419   HGBUR TRACE (A) 10/18/2013 0419   BILIRUBINUR NEGATIVE 10/18/2013 0419   KETONESUR NEGATIVE 10/18/2013 0419   PROTEINUR 100 (A) 10/18/2013 0419   UROBILINOGEN 0.2 10/18/2013 0419   NITRITE NEGATIVE 10/18/2013 0419   LEUKOCYTESUR NEGATIVE 10/18/2013 0419   Sepsis Labs: _0 (procalcitonin:4,lacticidven:4) ) Recent Results (from the past 240 hour(s))  MRSA PCR Screening     Status: None   Collection Time: 06/17/16 12:29 PM  Result Value Ref Range Status   MRSA by PCR NEGATIVE NEGATIVE Final    Comment:        The GeneXpert MRSA Assay (FDA approved for NASAL specimens only), is one component of a comprehensive MRSA colonization surveillance program. It is not intended to diagnose MRSA infection nor to guide or monitor treatment for MRSA infections.      Radiological Exams on Admission: No results found.  EKG: Ordered and pending.   Assessment/Plan  1. Hyperkalemia  - Serum potassium is 6.2 on arrival; EKG is ordered  and pending, pt reportedly refused initial attempt  - Secondary to ESRD with 4 days since last HD   - Nephrology has been consulted by the ED physician and agreed to inpatient HD  - Treat with 15 g PO Kayexalate, monitor on telemetry, obtain serial potassium levels q4h until HD is performed    2. ESRD  - Pt is currently dependent on hospital for HD and is dialyzed through cath in right chest while awaiting AVF maturation  - Potassium is elevated as above, but no apparent respiratory distress on admission  - "Dry" weight reportedly 160 lb; pt presents at 171 lb  - Nephrology is consulting and much appreciated - Calcium okay, will check PTH and phosphorus    3. Hx of DVT - Managed with warfarin at home - INR currently pending - Plan to continue warfarin; pharmacy asked to assist with dosing   4. GERD - Currently asymptomatic  - No EGD report on file  - Continue Pepcid and PPI   5. Hypertension  - Elevated on admission; excess volume likely contributing  - Continue Coreg, clonidine, hydralazine as tolerated   6. Anemia  - Hgb is stable at 11.2 with no signs of bleeding  - Attributed to CKD; managed by nephrology with darbepoeitin    DVT prophylaxis: warfarin  Code Status: Full  Family Communication: Discussed with patient Disposition Plan: Observe on telemtry Consults called: Nephrology  Admission status: Observation    Vianne Bulls, MD [facility]  Pager [phone]  If 7PM-7AM, please contact night-coverage [email or web address] Password [facility]  06/22/2016, 8:58 PM

## 2016-06-22 NOTE — ED Notes (Signed)
Pt states, "Why would I wait here without a tv?  I am going to call direct to dialysis and find out when there's no wait."  PA and this RN encouraged pt to stay, not to leave AMA.  Pt states he's aware of risks. Pt states intention to return later today.  This RN explained pt would have to begin waiting process over; pt expresses understanding.

## 2016-06-22 NOTE — ED Triage Notes (Signed)
Pt was here earlier today for dialysis. Pt left AMA. Refused to have vitals taken again at triage. No change in condition, just here for dialysis. No distress noted.

## 2016-06-22 NOTE — ED Notes (Signed)
[facility] has not arrived from pharmacy, nurse on [place] notified

## 2016-06-22 NOTE — ED Notes (Signed)
Pt refused kayexalate, informed patient that his potassium was 6.2 and that was dangerous, patient stated he still does not want the medication

## 2016-06-22 NOTE — ED Notes (Signed)
Pt refused the cardiac monitor, and refused to change into a gown, instructed on the importance of being in a gown so the edp can assess him and he still refused, instructed on the importance of the cardiac monitor, and he still refused

## 2016-06-23 ENCOUNTER — Encounter (HOSPITAL_COMMUNITY): Payer: Self-pay

## 2016-06-23 DIAGNOSIS — F329 Major depressive disorder, single episode, unspecified: Secondary | ICD-10-CM | POA: Diagnosis present

## 2016-06-23 DIAGNOSIS — E1122 Type 2 diabetes mellitus with diabetic chronic kidney disease: Secondary | ICD-10-CM | POA: Diagnosis present

## 2016-06-23 DIAGNOSIS — N186 End stage renal disease: Secondary | ICD-10-CM | POA: Diagnosis not present

## 2016-06-23 DIAGNOSIS — I120 Hypertensive chronic kidney disease with stage 5 chronic kidney disease or end stage renal disease: Secondary | ICD-10-CM | POA: Diagnosis not present

## 2016-06-23 DIAGNOSIS — Z992 Dependence on renal dialysis: Secondary | ICD-10-CM | POA: Diagnosis not present

## 2016-06-23 DIAGNOSIS — I132 Hypertensive heart and chronic kidney disease with heart failure and with stage 5 chronic kidney disease, or end stage renal disease: Secondary | ICD-10-CM | POA: Diagnosis present

## 2016-06-23 DIAGNOSIS — Z23 Encounter for immunization: Secondary | ICD-10-CM | POA: Diagnosis not present

## 2016-06-23 DIAGNOSIS — R791 Abnormal coagulation profile: Secondary | ICD-10-CM | POA: Diagnosis present

## 2016-06-23 DIAGNOSIS — Z79899 Other long term (current) drug therapy: Secondary | ICD-10-CM | POA: Diagnosis not present

## 2016-06-23 DIAGNOSIS — G8929 Other chronic pain: Secondary | ICD-10-CM | POA: Diagnosis present

## 2016-06-23 DIAGNOSIS — T8612 Kidney transplant failure: Secondary | ICD-10-CM | POA: Diagnosis present

## 2016-06-23 DIAGNOSIS — F4323 Adjustment disorder with mixed anxiety and depressed mood: Secondary | ICD-10-CM | POA: Diagnosis present

## 2016-06-23 DIAGNOSIS — M545 Low back pain: Secondary | ICD-10-CM | POA: Diagnosis present

## 2016-06-23 DIAGNOSIS — K219 Gastro-esophageal reflux disease without esophagitis: Secondary | ICD-10-CM | POA: Diagnosis not present

## 2016-06-23 DIAGNOSIS — Z9115 Patient's noncompliance with renal dialysis: Secondary | ICD-10-CM | POA: Diagnosis not present

## 2016-06-23 DIAGNOSIS — E875 Hyperkalemia: Secondary | ICD-10-CM | POA: Diagnosis not present

## 2016-06-23 DIAGNOSIS — I5042 Chronic combined systolic (congestive) and diastolic (congestive) heart failure: Secondary | ICD-10-CM | POA: Diagnosis present

## 2016-06-23 DIAGNOSIS — E878 Other disorders of electrolyte and fluid balance, not elsewhere classified: Secondary | ICD-10-CM | POA: Diagnosis present

## 2016-06-23 DIAGNOSIS — N2581 Secondary hyperparathyroidism of renal origin: Secondary | ICD-10-CM | POA: Diagnosis present

## 2016-06-23 DIAGNOSIS — N185 Chronic kidney disease, stage 5: Secondary | ICD-10-CM | POA: Diagnosis not present

## 2016-06-23 DIAGNOSIS — Z7901 Long term (current) use of anticoagulants: Secondary | ICD-10-CM | POA: Diagnosis not present

## 2016-06-23 DIAGNOSIS — E871 Hypo-osmolality and hyponatremia: Secondary | ICD-10-CM | POA: Diagnosis present

## 2016-06-23 DIAGNOSIS — D631 Anemia in chronic kidney disease: Secondary | ICD-10-CM | POA: Diagnosis not present

## 2016-06-23 DIAGNOSIS — F419 Anxiety disorder, unspecified: Secondary | ICD-10-CM | POA: Diagnosis present

## 2016-06-23 DIAGNOSIS — Y830 Surgical operation with transplant of whole organ as the cause of abnormal reaction of the patient, or of later complication, without mention of misadventure at the time of the procedure: Secondary | ICD-10-CM | POA: Diagnosis present

## 2016-06-23 DIAGNOSIS — R001 Bradycardia, unspecified: Secondary | ICD-10-CM | POA: Diagnosis not present

## 2016-06-23 DIAGNOSIS — I429 Cardiomyopathy, unspecified: Secondary | ICD-10-CM | POA: Diagnosis present

## 2016-06-23 DIAGNOSIS — Z86718 Personal history of other venous thrombosis and embolism: Secondary | ICD-10-CM | POA: Diagnosis not present

## 2016-06-23 LAB — PHOSPHORUS: Phosphorus: 6.2 mg/dL — ABNORMAL HIGH (ref 2.5–4.6)

## 2016-06-23 LAB — CBC
HEMATOCRIT: 30.9 % — AB (ref 39.0–52.0)
HEMOGLOBIN: 10 g/dL — AB (ref 13.0–17.0)
MCH: 28.2 pg (ref 26.0–34.0)
MCHC: 32.4 g/dL (ref 30.0–36.0)
MCV: 87.3 fL (ref 78.0–100.0)
Platelets: 263 10*3/uL (ref 150–400)
RBC: 3.54 MIL/uL — ABNORMAL LOW (ref 4.22–5.81)
RDW: 16.2 % — AB (ref 11.5–15.5)
WBC: 4.8 10*3/uL (ref 4.0–10.5)

## 2016-06-23 LAB — BASIC METABOLIC PANEL
ANION GAP: 16 — AB (ref 5–15)
BUN: 72 mg/dL — ABNORMAL HIGH (ref 6–20)
CALCIUM: 7.6 mg/dL — AB (ref 8.9–10.3)
CHLORIDE: 98 mmol/L — AB (ref 101–111)
CO2: 22 mmol/L (ref 22–32)
Creatinine, Ser: 16.59 mg/dL — ABNORMAL HIGH (ref 0.61–1.24)
GFR calc non Af Amer: 3 mL/min — ABNORMAL LOW (ref >60)
GFR, EST AFRICAN AMERICAN: 3 mL/min — AB (ref >60)
Glucose, Bld: 65 mg/dL (ref 65–99)
POTASSIUM: 6.5 mmol/L — AB (ref 3.5–5.1)
Sodium: 136 mmol/L (ref 135–145)

## 2016-06-23 LAB — PROTIME-INR
INR: 1.15
Prothrombin Time: 14.8 seconds (ref 11.4–15.2)

## 2016-06-23 LAB — GLUCOSE, CAPILLARY: Glucose-Capillary: 98 mg/dL (ref 65–99)

## 2016-06-23 MED ORDER — ALTEPLASE 2 MG IJ SOLR: 2.0000 mg | Freq: Once | INTRAMUSCULAR | Status: DC | PRN

## 2016-06-23 MED ORDER — HEPARIN SODIUM (PORCINE) 1000 UNIT/ML DIALYSIS
4000.0000 [IU] | INTRAMUSCULAR | Status: DC | PRN
  Administered 2016-06-23: 4000 [IU] via INTRAVENOUS_CENTRAL

## 2016-06-23 MED ORDER — SODIUM CHLORIDE 0.9 % IV SOLN: 100.0000 mL | INTRAVENOUS | Status: DC | PRN

## 2016-06-23 MED ORDER — LIDOCAINE HCL (PF) 1 % IJ SOLN: 5.0000 mL | INTRAMUSCULAR | Status: DC | PRN

## 2016-06-23 MED ORDER — PENTAFLUOROPROP-TETRAFLUOROETH EX AERO: 1.0000 "application " | INHALATION_SPRAY | CUTANEOUS | Status: DC | PRN

## 2016-06-23 MED ORDER — HEPARIN SODIUM (PORCINE) 1000 UNIT/ML DIALYSIS: 1000.0000 [IU] | INTRAMUSCULAR | Status: DC | PRN

## 2016-06-23 MED ORDER — LIDOCAINE-PRILOCAINE 2.5-2.5 % EX CREA: 1.0000 "application " | TOPICAL_CREAM | CUTANEOUS | Status: DC | PRN

## 2016-06-23 MED ORDER — WARFARIN - PHARMACIST DOSING INPATIENT: Freq: Every day | Status: DC

## 2016-06-23 MED ORDER — WARFARIN SODIUM 10 MG PO TABS
10.0000 mg | ORAL_TABLET | Freq: Once | ORAL | Status: AC
  Administered 2016-06-23: 10 mg via ORAL
  Filled 2016-06-23: qty 1

## 2016-06-23 NOTE — Progress Notes (Addendum)
CRITICAL VALUE ALERT  Critical value received:  K+ 6.5  Date of notification:  06/23/16  Time of notification:  0725  Critical value read back: yes  Nurse who received alert: Manya Silvas, RN  MD notified (1st page):  Hemodialysis RN notified at 0730; Dr. Jonnie Finner paged [phone] Received call back from Dr. Jonnie Finner. Informed of potassium 6.5 and that hemodialysis was aware. Bartholomew Crews, RN

## 2016-06-23 NOTE — Progress Notes (Signed)
Critical lab value received: Potassium - >7.5.  Nephrology notified.    Will continue to monitor.

## 2016-06-23 NOTE — Progress Notes (Signed)
Nephrology notified of bradycardia, to HR of 30-40.  Frequent ectopy.  Will continue to monitor.

## 2016-06-23 NOTE — Progress Notes (Signed)
[facility] Progress Note   Subjective: presented to ED again for IP HD, wrote orders for HD and when put on monitor tracing up in HD unit, tracing showed wide QRS with long pauses and HR's into the 30's off and on.  Got HD on low K bath for about 30 min then the QRS cleared up and now has a nice narrow QRS.  K+ came up > 7.5 from pre HD.   Vitals:   06/23/16 1238 06/23/16 1245 06/23/16 1315 06/23/16 1345  BP: 134/73 119/82 (!) 151/107 (!) 193/118  Pulse: (!) 37 (!) 31 70 70  Resp:      Temp:      TempSrc:      SpO2:      Weight:        Inpatient medications: . allopurinol  100 mg Oral Daily  . amLODipine  10 mg Oral BID  . atorvastatin  40 mg Oral q1800  . carvedilol  25 mg Oral BID WC  . cinacalcet  30 mg Oral QPM  . cloNIDine  0.3 mg Transdermal Weekly  . doxazosin  2 mg Oral Daily  . famotidine  20 mg Oral Daily  . gabapentin  100 mg Oral TID  . hydrALAZINE  100 mg Oral BID  . isosorbide mononitrate  60 mg Oral Daily  . pantoprazole  40 mg Oral Q1200  . predniSONE  10 mg Oral Q breakfast  . sevelamer carbonate  1,600 mg Oral TID WC  . sodium chloride flush  3 mL Intravenous Q12H  . sodium chloride flush  3 mL Intravenous Q12H  . sodium polystyrene  15 g Oral Once  . warfarin  10 mg Oral ONCE-1800  . Warfarin - Pharmacist Dosing Inpatient   Does not apply q1800     Exam: Gen pale, pleasant, no distress Dry skin No jvd or bruits Chest clear bilat RRR no MRG Abd soft ntnd no ascites Ext no LE or UEedema Neuro is alert, Ox 3 , nf LUA AVF maturing/ R IJ cath    Dialysis:patient has no OP HD unit. Dry wt "160 lbs" per pt.     Assessment: 1. Hyperkalemia - severe w EKG changes on HD just now. Resolving with low K bath. Would keep as IP and repeat HD tomorrow.  D/W pt about high K foods.  2. ESRD - has not outpatient HD unit currently; will be accepted by [facility] (he has d/w Dr Florene Glen last week per pt) that when AVF is in use he can  start HD in [place].  3. Hx DVT/ PE - has had 2 episodes (1 LUL PE in 2015 felt to be HD-cath related, the other was RUE brachial/ basilic thrombosis 4580 w/o PE); possible that these episodes were all related to his chronic indwelling HD cath. If he ever gets a working fistula and HD cath is removed, would consider coumadin withdrawal.  4. NICM - EF 35-40%   5. Anemia - getting darbe here approx weekly, 100-150 ug, Hb up 11-12 range, so hold for now. 11/8 tsat was 22% but pt refused IV iron; he has allergy to ferric gluconate but rec'd Feraheme x 1 in Aug '17 w/o incident 6. HTN on 5 bp meds 7. MBD - Ca ok, check phos/ pth  Plan - HD today and tomorrow, low K diet   Kelly Splinter MD [place] pager [phone]   06/23/2016, 1:51 PM    Recent Labs Lab 06/20/16 0736 06/22/16 1052 06/23/16  0521 06/23/16 1257  NA 136 136 136 135  K 5.4* 6.2* 6.5* >7.5*  CL 99* 104 98* 100*  CO2 24  --  22 22  GLUCOSE 131* 106* 65 90  BUN 31* 61* 72* 74*  CREATININE 10.73* 16.00* 16.59* 16.78*  CALCIUM 8.8*  --  7.6* 7.8*  PHOS  --   --  6.2* 5.9*    Recent Labs Lab 06/17/16 0022 06/23/16 1257  AST 17  --   ALT 9*  --   ALKPHOS 159*  --   BILITOT 0.6  --   PROT 7.5  --   ALBUMIN 3.4* 2.7*    Recent Labs Lab 06/19/16 0726 06/20/16 0736 06/22/16 1052 06/23/16 1255  WBC 3.6* 3.6*  --  4.8  NEUTROABS 2.0  --   --   --   HGB 11.2* 11.1* 11.2* 10.0*  HCT 35.0* 35.3* 33.0* 30.9*  MCV 90.4 90.7  --  87.3  PLT 211 197  --  263   Iron/TIBC/Ferritin/ %Sat    Component Value Date/Time   IRON 41 (L) 05/29/2016 1350   TIBC 190 (L) 05/29/2016 1350   FERRITIN 328 11/02/2015 0346   IRONPCTSAT 22 05/29/2016 1350

## 2016-06-23 NOTE — Progress Notes (Signed)
PROGRESS NOTE [facility]   Frank [name]   CVE:[id number] DOB: [birth year]  DOA: 06/22/2016 PCP: Angelica Chessman, MD   Brief Narrative:  50 year old male with medical history significant for end-stage renal disease on dialysis, hypertension, CAD, history of DVT and PE on warfarin, presenting to the emergency department requesting hemodialysis. Patient does not have the dialysis center for which she comes to the hospital and so does his dialysis. On evaluation in the ED he was found to be hyperkalemic potassium of 6.2 and blood pressures ranging the 180s/100s. Patient was admitted for dialysis and nephrology was consulted. Patient is noncompliant with medications as he is not taking warfarin as indicated. He was ordered Kayexalate but patient refused and repeat potassium was 7.5. During dialysis patient was with wide QRS and heart rate in the low 30s.  Subjective: Patient seen and examined, stating that he just need dialysis. Denies any complaints. No chest pain or palpitations. Patient reports that he knows he body and doesn't need anything else other than dialysis. Patient refused to get an EKG.  Assessment & Plan: Hyperkalemia with severe EKG changes seen during hemodialysis Which resulted with low K bath Patient for repeat dialysis tomorrow Compliant with telemetry monitor. Refusing EKGs.  ESRD  Hemodialysis as scheduled by nephrologist.  Patient does not have the dialysis unit per nephrology knows he will be accepted by [facility] is in use. Repeat BMP in the morning  Hx of DVT- likely related to HD catheter. Continue Coumadin Patient noncompliant as an outpatient with therapy, one fistulous working consider to stop Coumadin.  GERD Continue PPIs   Hypertension - secondary to fluid overload due to missing dialysis. Expect to improve with dialysis Continue home medications.  Anemia chronic disease secondary to CKD   Stable   DVT prophylaxis:  Warfarin Code Status: Full Family Communication: None at bedside Disposition Plan: Possible discharge tomorrow after dialysis.  Consultants:   Nephrology  Procedures:   Hemodialysis  Antimicrobials:  None   Objective: Vitals:   06/23/16 1345 06/23/16 1415 06/23/16 1445 06/23/16 1515  BP: (!) 193/118 (!) 168/112 (!) 179/112 (!) 170/109  Pulse: 70 71 69 66  Resp:      Temp:      TempSrc:      SpO2:      Weight:        Intake/Output Summary (Last 24 hours) at 06/23/16 1539 Last data filed at 06/23/16 1055  Gross per 24 hour  Intake              560 ml  Output                0 ml  Net              560 ml   Filed Weights   06/23/16 1229  Weight: 78 kg (171 lb 15.3 oz)    Examination:  General exam: Appears calm and comfortable  Respiratory system: Clear to auscultation. No wheezes,crackle or rhonchi Cardiovascular system: S1 & S2 heard, RRR. No JVD, murmurs, rubs or gallops Central nervous system: Alert and oriented. No focal neurological deficits. Extremities: No pedal edema. LUA fistula with good thrill  Skin: RIJ   Data Reviewed: I have personally reviewed following labs and imaging studies  CBC:  Recent Labs Lab 06/18/16 0422 06/18/16 1843 06/19/16 0726 06/20/16 0736 06/22/16 1052 06/23/16 1255  WBC 3.1* 3.5* 3.6* 3.6*  --  4.8  NEUTROABS  --   --  2.0  --   --   --  HGB 10.0* 11.2* 11.2* 11.1* 11.2* 10.0*  HCT 31.8* 35.2* 35.0* 35.3* 33.0* 30.9*  MCV 89.6 90.3 90.4 90.7  --  87.3  PLT 190 172 211 197  --  329   Basic Metabolic Panel:  Recent Labs Lab 06/18/16 1843 06/19/16 0726 06/20/16 0736 06/22/16 1052 06/23/16 0521 06/23/16 1257  NA 134* 135 136 136 136 135  K 4.5 5.3* 5.4* 6.2* 6.5* >7.5*  CL 96* 96* 99* 104 98* 100*  CO2 _0 --  22 22  GLUCOSE 76 169* 131* 106* 65 90  BUN 14 20 31* 61* 72* 74*  CREATININE 6.27* 8.20* 10.73* 16.00* 16.59* 16.78*  CALCIUM 8.7* 8.8* 8.8*  --  7.6* 7.8*  PHOS  --   --   --   --  6.2*  5.9*   GFR: Estimated Creatinine Clearance: 5.7 mL/min (by C-G formula based on SCr of 16.78 mg/dL (H)). Liver Function Tests:  Recent Labs Lab 06/17/16 0022 06/23/16 1257  AST 17  --   ALT 9*  --   ALKPHOS 159*  --   BILITOT 0.6  --   PROT 7.5  --   ALBUMIN 3.4* 2.7*    Recent Labs Lab 06/17/16 0022  LIPASE 39   No results for input(s): AMMONIA in the last 168 hours. Coagulation Profile:  Recent Labs Lab 06/17/16 1055 06/18/16 0422 06/19/16 0726 06/23/16 0521  INR 1.01 1.11 1.20 1.15   Cardiac Enzymes:  Recent Labs Lab 06/19/16 0726  TROPONINI 0.03*   BNP (last 3 results) No results for input(s): PROBNP in the last 8760 hours. HbA1C: No results for input(s): HGBA1C in the last 72 hours. CBG:  Recent Labs Lab 06/17/16 0338 06/17/16 0445 06/23/16 0754  GLUCAP 101* 70 98   Lipid Profile: No results for input(s): CHOL, HDL, LDLCALC, TRIG, CHOLHDL, LDLDIRECT in the last 72 hours. Thyroid Function Tests: No results for input(s): TSH, T4TOTAL, FREET4, T3FREE, THYROIDAB in the last 72 hours. Anemia Panel: No results for input(s): VITAMINB12, FOLATE, FERRITIN, TIBC, IRON, RETICCTPCT in the last 72 hours. Sepsis Labs: No results for input(s): PROCALCITON, LATICACIDVEN in the last 168 hours.  Recent Results (from the past 240 hour(s))  MRSA PCR Screening     Status: None   Collection Time: 06/17/16 12:29 PM  Result Value Ref Range Status   MRSA by PCR NEGATIVE NEGATIVE Final    Comment:        The GeneXpert MRSA Assay (FDA approved for NASAL specimens only), is one component of a comprehensive MRSA colonization surveillance program. It is not intended to diagnose MRSA infection nor to guide or monitor treatment for MRSA infections.       Radiology Studies: No results found.    Scheduled Meds: . allopurinol  100 mg Oral Daily  . amLODipine  10 mg Oral BID  . atorvastatin  40 mg Oral q1800  . carvedilol  25 mg Oral BID WC  . cinacalcet   30 mg Oral QPM  . cloNIDine  0.3 mg Transdermal Weekly  . doxazosin  2 mg Oral Daily  . famotidine  20 mg Oral Daily  . gabapentin  100 mg Oral TID  . hydrALAZINE  100 mg Oral BID  . isosorbide mononitrate  60 mg Oral Daily  . pantoprazole  40 mg Oral Q1200  . predniSONE  10 mg Oral Q breakfast  . sevelamer carbonate  1,600 mg Oral TID WC  . sodium chloride flush  3 mL Intravenous Q12H  .  sodium chloride flush  3 mL Intravenous Q12H  . sodium polystyrene  15 g Oral Once  . warfarin  10 mg Oral ONCE-1800  . Warfarin - Pharmacist Dosing Inpatient   Does not apply q1800   Continuous Infusions:   LOS: 0 days    Chipper Oman, MD [facility] Pager [phone]  If 7PM-7AM, please contact night-coverage [email or web address] Password [facility] 06/23/2016, 3:39 PM

## 2016-06-23 NOTE — Progress Notes (Addendum)
ANTICOAGULATION CONSULT NOTE - Initial Consult  Pharmacy Consult for warfarin Indication: DVT  Allergies  Allergen Reactions  . Butalbital-Apap-Caffeine Shortness Of Breath, Swelling and Other (See Comments)    Swelling in throat  . Ferrlecit [Na Ferric Gluc Cplx In Sucrose] Shortness Of Breath, Swelling and Other (See Comments)    Swelling in throat  . Minoxidil Shortness Of Breath  . Darvocet [Propoxyphene N-Acetaminophen] Hives   Patient Measurements:   Heparin Dosing Weight: 77.8  Vital Signs: Temp: 98.2 F (36.8 C) (12/03 1011) Temp Source: Oral (12/03 1011) BP: 175/95 (12/03 1011) Pulse Rate: 58 (12/03 1011)  Labs:  Recent Labs  06/22/16 1052 06/23/16 0521  HGB 11.2*  --   HCT 33.0*  --   LABPROT  --  14.8  INR  --  1.15  CREATININE 16.00* 16.59*    Estimated Creatinine Clearance: 5.7 mL/min (by C-G formula based on SCr of 16.59 mg/dL (H)).   Medical History: Past Medical History:  Diagnosis Date  . Anemia   . Anxiety   . Chronic combined systolic and diastolic CHF (congestive heart failure) (HCC)    a. EF 20-25% by echo in 08/2015 b. echo 10/2015: EF 35-40%, diffuse HK, severe LAE, moderate RAE, small pericardial effusion  . Complication of anesthesia    itching, sore throat  . Depression   . Dialysis patient ([place])   . ESRD (end stage renal disease) ([place])    due to HTN per patient, followed at [facility], s/p failed kidney transplant - dialysis Tue, Th, Sat  . Hyperkalemia 12/2015  . Hypertension   . Junctional rhythm    a. noted in 08/2015: hyperkalemic at that time  b. 12/2015: presented in junctional rhythm w/ K+ of 6.6. Resolved with improvement of K+ levels.  . Nonischemic cardiomyopathy ([place])    a. 08/2014: cath showing minimal CAD, but tortuous arteries noted.   . Personal history of DVT (deep vein thrombosis)/ PE 05/26/2016   In Oct 2015 had small subsemental LUL PE w/o DVT (LE dopplers neg) and was felt to be HD cath related, treated w  coumadin.  IN May 2016 had small vein DVT (acute/subacute) in the R basilic/ brachial veins of the RUE, resumed on coumadin.  Had R sided HD cath at that time.    . Renal insufficiency   . Shortness of breath   . Type II diabetes mellitus (HCC)    No history per patient, but remains under history as A1c would not be accurate given on dialysis    Medications:  Prescriptions Prior to Admission  Medication Sig Dispense Refill Last Dose  . allopurinol (ZYLOPRIM) 100 MG tablet Take 100 mg by mouth daily.   06/21/2016 at Unknown time  . amLODipine (NORVASC) 10 MG tablet Take 10 mg by mouth 2 (two) times daily.    06/21/2016 at Unknown time  . atorvastatin (LIPITOR) 40 MG tablet Take 1 tablet (40 mg total) by mouth daily at 6 PM. 30 tablet 1 06/21/2016 at Unknown time  . carvedilol (COREG) 25 MG tablet Take 25 mg by mouth 2 (two) times daily with a meal.   06/21/2016 at Unknown time  . cinacalcet (SENSIPAR) 30 MG tablet Take 30 mg by mouth every evening.    06/21/2016 at Unknown time  . cloNIDine (CATAPRES - DOSED IN MG/24 HR) 0.3 mg/24hr patch Place 0.3 mg onto the skin once a week.   06/21/2016 at Unknown time  . doxazosin (CARDURA) 2 MG tablet Take 2 mg by mouth daily.  06/21/2016 at Unknown time  . famotidine (PEPCID) 20 MG tablet Take 1 tablet (20 mg total) by mouth 2 (two) times daily. 30 tablet 0 06/21/2016 at Unknown time  . gabapentin (NEURONTIN) 100 MG capsule Take 1 capsule (100 mg total) by mouth at bedtime. (Patient taking differently: Take 100 mg by mouth 3 (three) times daily. )   06/21/2016 at Unknown time  . hydrALAZINE (APRESOLINE) 100 MG tablet Take 100 mg by mouth 2 (two) times daily.    06/21/2016 at Unknown time  . isosorbide mononitrate (IMDUR) 60 MG 24 hr tablet Take 1 tablet (60 mg total) by mouth daily.   06/21/2016 at Unknown time  . omeprazole (PRILOSEC) 20 MG capsule Take 20 mg by mouth daily.   06/21/2016 at Unknown time  . ondansetron (ZOFRAN ODT) 4 MG disintegrating tablet Take 1  tablet (4 mg total) by mouth every 8 (eight) hours as needed for nausea or vomiting. 10 tablet 0 06/21/2016 at Unknown time  . oxycodone (OXY-IR) 5 MG capsule Take 1 capsule (5 mg total) by mouth every 4 (four) hours as needed for pain. 5 capsule 0 06/21/2016 at Unknown time  . oxyCODONE-acetaminophen (PERCOCET/ROXICET) 5-325 MG tablet Take 1 tablet by mouth every 6 (six) hours as needed for severe pain.   06/21/2016 at Unknown time  . predniSONE (DELTASONE) 10 MG tablet Take 10 mg by mouth daily with breakfast.   06/21/2016 at Unknown time  . sevelamer carbonate (RENVELA) 800 MG tablet Take 1,600 mg by mouth 3 (three) times daily with meals.    06/21/2016 at Unknown time  . sulfamethoxazole-trimethoprim (BACTRIM,SEPTRA) 400-80 MG tablet Take 1 tablet by mouth 3 (three) times a week.   06/21/2016 at Unknown time  . warfarin (COUMADIN) 2 MG tablet Take 2 mg by mouth daily. Take with 53m tablet to equal 759m  06/21/2016 at Unknown time  . warfarin (COUMADIN) 5 MG tablet Take 5 mg by mouth See admin instructions. Take 1 tablet (5 mg) by mouth with a 2 mg tablet for a 7 mg dose   06/21/2016 at Unknown time    Assessment: BrSHEROD CISSEs a [age].o. male with history of PE and DVT on warfarin PTA who presents to the emergency department to request hemodialysis. Last dose of warfarin was document to be 06/21/16 with INR today 1.15. HgB 11.2. Following dialysis patient will be discharged, MD does not want to bridge at this time.  Warfarin regimen PTA: 70m32maily  Goal of Therapy:  INR 2-3 Monitor platelets by anticoagulation protocol: Yes   Plan:  Warfarin 56m44m Daily INR/CBC Monitor s/sx of bleeding  Prabhjot Piscitello L Gwynevere Lizana 06/23/2016,12:44 PM

## 2016-06-23 NOTE — Progress Notes (Signed)
Dialysis treatment completed.  4500 mL ultrafiltrated.  4000 mL net fluid removal.  Patient status unchanged. Lung sounds diminished to ausculation in all fields. Generalized edema. Cardiac: NSR.  Cleansed RIJ catheter with chlorhexidine.  Disconnected lines and flushed ports with saline per protocol.  Ports locked with heparin and capped per protocol.    Report given to bedside, RN Wendi.

## 2016-06-23 NOTE — Progress Notes (Signed)
Patient arrived to unit by bed.  Reviewed treatment plan and this RN agrees with plan.  Report received from bedside RN, Ellard Artis.  Consent obtained.  Patient A & O X 4.   Lung sounds clear to ausculation in all fields. Generalized edema. Cardiac:  Bradycardic, frequent ectopy.  Removed caps and cleansed RIJ catheter with chlorhedxidine.  Aspirated ports of heparin and flushed them with saline per protocol.  Connected and secured lines, initiated treatment at 1238.  UF Goal of 4545m and net fluid removal 4L.  Will continue to monitor.

## 2016-06-23 NOTE — Progress Notes (Signed)
Pt refused to be placed on tele monitor, have vital signs taken, and to have a full skin assessment. MD notified and aware.

## 2016-06-24 ENCOUNTER — Inpatient Hospital Stay (HOSPITAL_COMMUNITY)
Admission: EM | Admit: 2016-06-24 | Discharge: 2016-06-26 | Disposition: A | Discharge status: Home / Self Care | Payer: Medicare Other | Source: Home / Self Care | Attending: Family Medicine | Admitting: Family Medicine

## 2016-06-24 ENCOUNTER — Encounter (HOSPITAL_COMMUNITY): Payer: Self-pay | Admitting: *Deleted

## 2016-06-24 ENCOUNTER — Other Ambulatory Visit: Payer: Self-pay

## 2016-06-24 DIAGNOSIS — E875 Hyperkalemia: Principal | ICD-10-CM | POA: Diagnosis present

## 2016-06-24 DIAGNOSIS — E1122 Type 2 diabetes mellitus with diabetic chronic kidney disease: Secondary | ICD-10-CM | POA: Diagnosis present

## 2016-06-24 DIAGNOSIS — Z86718 Personal history of other venous thrombosis and embolism: Secondary | ICD-10-CM

## 2016-06-24 DIAGNOSIS — I10 Essential (primary) hypertension: Secondary | ICD-10-CM

## 2016-06-24 DIAGNOSIS — Z23 Encounter for immunization: Secondary | ICD-10-CM

## 2016-06-24 DIAGNOSIS — Z9115 Patient's noncompliance with renal dialysis: Secondary | ICD-10-CM

## 2016-06-24 DIAGNOSIS — Z888 Allergy status to other drugs, medicaments and biological substances status: Secondary | ICD-10-CM

## 2016-06-24 DIAGNOSIS — N2581 Secondary hyperparathyroidism of renal origin: Secondary | ICD-10-CM | POA: Diagnosis present

## 2016-06-24 DIAGNOSIS — I428 Other cardiomyopathies: Secondary | ICD-10-CM

## 2016-06-24 DIAGNOSIS — I132 Hypertensive heart and chronic kidney disease with heart failure and with stage 5 chronic kidney disease, or end stage renal disease: Secondary | ICD-10-CM | POA: Diagnosis present

## 2016-06-24 DIAGNOSIS — G8929 Other chronic pain: Secondary | ICD-10-CM | POA: Diagnosis present

## 2016-06-24 DIAGNOSIS — K219 Gastro-esophageal reflux disease without esophagitis: Secondary | ICD-10-CM | POA: Diagnosis present

## 2016-06-24 DIAGNOSIS — R791 Abnormal coagulation profile: Secondary | ICD-10-CM | POA: Diagnosis present

## 2016-06-24 DIAGNOSIS — M545 Low back pain: Secondary | ICD-10-CM | POA: Diagnosis present

## 2016-06-24 DIAGNOSIS — E878 Other disorders of electrolyte and fluid balance, not elsewhere classified: Secondary | ICD-10-CM | POA: Diagnosis present

## 2016-06-24 DIAGNOSIS — D631 Anemia in chronic kidney disease: Secondary | ICD-10-CM | POA: Diagnosis present

## 2016-06-24 DIAGNOSIS — T8612 Kidney transplant failure: Secondary | ICD-10-CM | POA: Diagnosis present

## 2016-06-24 DIAGNOSIS — T82898A Other specified complication of vascular prosthetic devices, implants and grafts, initial encounter: Secondary | ICD-10-CM

## 2016-06-24 DIAGNOSIS — I5042 Chronic combined systolic (congestive) and diastolic (congestive) heart failure: Secondary | ICD-10-CM | POA: Diagnosis present

## 2016-06-24 DIAGNOSIS — I429 Cardiomyopathy, unspecified: Secondary | ICD-10-CM | POA: Diagnosis present

## 2016-06-24 DIAGNOSIS — F419 Anxiety disorder, unspecified: Secondary | ICD-10-CM | POA: Diagnosis present

## 2016-06-24 DIAGNOSIS — Z87891 Personal history of nicotine dependence: Secondary | ICD-10-CM

## 2016-06-24 DIAGNOSIS — Z86711 Personal history of pulmonary embolism: Secondary | ICD-10-CM

## 2016-06-24 DIAGNOSIS — E871 Hypo-osmolality and hyponatremia: Secondary | ICD-10-CM | POA: Diagnosis present

## 2016-06-24 DIAGNOSIS — F329 Major depressive disorder, single episode, unspecified: Secondary | ICD-10-CM | POA: Diagnosis present

## 2016-06-24 DIAGNOSIS — R001 Bradycardia, unspecified: Secondary | ICD-10-CM | POA: Diagnosis present

## 2016-06-24 DIAGNOSIS — Z8249 Family history of ischemic heart disease and other diseases of the circulatory system: Secondary | ICD-10-CM

## 2016-06-24 DIAGNOSIS — Z992 Dependence on renal dialysis: Secondary | ICD-10-CM

## 2016-06-24 DIAGNOSIS — N189 Chronic kidney disease, unspecified: Secondary | ICD-10-CM

## 2016-06-24 DIAGNOSIS — N186 End stage renal disease: Secondary | ICD-10-CM | POA: Diagnosis present

## 2016-06-24 DIAGNOSIS — Z7901 Long term (current) use of anticoagulants: Secondary | ICD-10-CM

## 2016-06-24 DIAGNOSIS — Y830 Surgical operation with transplant of whole organ as the cause of abnormal reaction of the patient, or of later complication, without mention of misadventure at the time of the procedure: Secondary | ICD-10-CM | POA: Diagnosis present

## 2016-06-24 DIAGNOSIS — Z79899 Other long term (current) drug therapy: Secondary | ICD-10-CM

## 2016-06-24 DIAGNOSIS — F4323 Adjustment disorder with mixed anxiety and depressed mood: Secondary | ICD-10-CM | POA: Diagnosis present

## 2016-06-24 LAB — CBC WITH DIFFERENTIAL/PLATELET
Basophils Absolute: 0 K/uL (ref 0.0–0.1)
Basophils Relative: 0 %
Eosinophils Absolute: 0.1 K/uL (ref 0.0–0.7)
Eosinophils Relative: 3 %
HCT: 31 % — ABNORMAL LOW (ref 39.0–52.0)
Hemoglobin: 9.9 g/dL — ABNORMAL LOW (ref 13.0–17.0)
Lymphocytes Relative: 21 %
Lymphs Abs: 1 K/uL (ref 0.7–4.0)
MCH: 27.9 pg (ref 26.0–34.0)
MCHC: 31.9 g/dL (ref 30.0–36.0)
MCV: 87.3 fL (ref 78.0–100.0)
Monocytes Absolute: 0.6 K/uL (ref 0.1–1.0)
Monocytes Relative: 13 %
Neutro Abs: 2.8 K/uL (ref 1.7–7.7)
Neutrophils Relative %: 63 %
Platelets: 260 K/uL (ref 150–400)
RBC: 3.55 MIL/uL — ABNORMAL LOW (ref 4.22–5.81)
RDW: 16.1 % — ABNORMAL HIGH (ref 11.5–15.5)
WBC: 4.5 K/uL (ref 4.0–10.5)

## 2016-06-24 LAB — CBC
HCT: 31.7 % — ABNORMAL LOW (ref 39.0–52.0)
Hemoglobin: 10 g/dL — ABNORMAL LOW (ref 13.0–17.0)
MCH: 28 pg (ref 26.0–34.0)
MCHC: 31.5 g/dL (ref 30.0–36.0)
MCV: 88.8 fL (ref 78.0–100.0)
PLATELETS: 197 10*3/uL (ref 150–400)
RBC: 3.57 MIL/uL — AB (ref 4.22–5.81)
RDW: 16.2 % — ABNORMAL HIGH (ref 11.5–15.5)
WBC: 4.2 10*3/uL (ref 4.0–10.5)

## 2016-06-24 LAB — HEPATIC FUNCTION PANEL
ALBUMIN: 3.4 g/dL — AB (ref 3.5–5.0)
ALK PHOS: 145 U/L — AB (ref 38–126)
ALT: 12 U/L — ABNORMAL LOW (ref 17–63)
AST: 23 U/L (ref 15–41)
BILIRUBIN TOTAL: 0.3 mg/dL (ref 0.3–1.2)
Total Protein: 7.2 g/dL (ref 6.5–8.1)

## 2016-06-24 LAB — POCT I-STAT, CHEM 8
BUN: 28 mg/dL — ABNORMAL HIGH (ref 6–20)
Calcium, Ion: 1 mmol/L — ABNORMAL LOW (ref 1.15–1.40)
Chloride: 98 mmol/L — ABNORMAL LOW (ref 101–111)
Creatinine, Ser: 8.2 mg/dL — ABNORMAL HIGH (ref 0.61–1.24)
Glucose, Bld: 104 mg/dL — ABNORMAL HIGH (ref 65–99)
HCT: 32 % — ABNORMAL LOW (ref 39.0–52.0)
Hemoglobin: 10.9 g/dL — ABNORMAL LOW (ref 13.0–17.0)
Potassium: 3.6 mmol/L (ref 3.5–5.1)
Sodium: 139 mmol/L (ref 135–145)
TCO2: 28 mmol/L (ref 0–100)

## 2016-06-24 LAB — DIFFERENTIAL
BASOS ABS: 0 10*3/uL (ref 0.0–0.1)
BASOS PCT: 0 %
EOS ABS: 0 10*3/uL (ref 0.0–0.7)
EOS PCT: 0 %
LYMPHS ABS: 0.6 10*3/uL — AB (ref 0.7–4.0)
Lymphocytes Relative: 13 %
MONOS PCT: 5 %
Monocytes Absolute: 0.2 10*3/uL (ref 0.1–1.0)
NEUTROS PCT: 82 %
Neutro Abs: 3.4 10*3/uL (ref 1.7–7.7)

## 2016-06-24 LAB — PROTIME-INR
INR: 1.17
INR: 1.22
PROTHROMBIN TIME: 15 s (ref 11.4–15.2)
Prothrombin Time: 15.4 seconds — ABNORMAL HIGH (ref 11.4–15.2)

## 2016-06-24 LAB — GLUCOSE, CAPILLARY: Glucose-Capillary: 134 mg/dL — ABNORMAL HIGH (ref 65–99)

## 2016-06-24 LAB — BASIC METABOLIC PANEL
Anion gap: 12 (ref 5–15)
BUN: 33 mg/dL — ABNORMAL HIGH (ref 6–20)
CALCIUM: 8.1 mg/dL — AB (ref 8.9–10.3)
CO2: 24 mmol/L (ref 22–32)
CREATININE: 10.25 mg/dL — AB (ref 0.61–1.24)
Chloride: 97 mmol/L — ABNORMAL LOW (ref 101–111)
GFR, EST AFRICAN AMERICAN: 6 mL/min — AB (ref >60)
GFR, EST NON AFRICAN AMERICAN: 5 mL/min — AB (ref >60)
Glucose, Bld: 134 mg/dL — ABNORMAL HIGH (ref 65–99)
Potassium: 7.4 mmol/L (ref 3.5–5.1)
SODIUM: 133 mmol/L — AB (ref 135–145)

## 2016-06-24 LAB — PARATHYROID HORMONE, INTACT (NO CA): PTH: 575 pg/mL — ABNORMAL HIGH (ref 15–65)

## 2016-06-24 LAB — POTASSIUM: POTASSIUM: 5.1 mmol/L (ref 3.5–5.1)

## 2016-06-24 LAB — I-STAT TROPONIN, ED: Troponin i, poc: 0.02 ng/mL (ref 0.00–0.08)

## 2016-06-24 MED ORDER — SEVELAMER CARBONATE 800 MG PO TABS
1600.0000 mg | ORAL_TABLET | Freq: Three times a day (TID) | ORAL | Status: DC
  Administered 2016-06-25 – 2016-06-26 (×4): 1600 mg via ORAL
  Filled 2016-06-24 (×4): qty 2

## 2016-06-24 MED ORDER — OXYCODONE HCL 5 MG PO TABS
5.0000 mg | ORAL_TABLET | ORAL | Status: DC | PRN
  Administered 2016-06-25 – 2016-06-26 (×7): 5 mg via ORAL
  Filled 2016-06-24 (×6): qty 1

## 2016-06-24 MED ORDER — ISOSORBIDE MONONITRATE ER 60 MG PO TB24
60.0000 mg | ORAL_TABLET | Freq: Every day | ORAL | Status: DC
  Administered 2016-06-25 – 2016-06-26 (×2): 60 mg via ORAL
  Filled 2016-06-24 (×2): qty 1

## 2016-06-24 MED ORDER — DEXTROSE 50 % IV SOLN
50.0000 mL | Freq: Once | INTRAVENOUS | Status: AC
  Administered 2016-06-24: 50 mL via INTRAVENOUS
  Filled 2016-06-24: qty 50

## 2016-06-24 MED ORDER — POLYETHYLENE GLYCOL 3350 17 G PO PACK: 17.0000 g | PACK | Freq: Every day | ORAL | Status: DC | PRN

## 2016-06-24 MED ORDER — ALLOPURINOL 100 MG PO TABS
100.0000 mg | ORAL_TABLET | Freq: Every day | ORAL | Status: DC
  Administered 2016-06-25 – 2016-06-26 (×2): 100 mg via ORAL
  Filled 2016-06-24 (×2): qty 1

## 2016-06-24 MED ORDER — BISACODYL 5 MG PO TBEC
5.0000 mg | DELAYED_RELEASE_TABLET | Freq: Every day | ORAL | Status: DC | PRN
  Filled 2016-06-24: qty 1

## 2016-06-24 MED ORDER — ATORVASTATIN CALCIUM 40 MG PO TABS
40.0000 mg | ORAL_TABLET | Freq: Every day | ORAL | Status: DC
  Administered 2016-06-25: 40 mg via ORAL
  Filled 2016-06-24: qty 1

## 2016-06-24 MED ORDER — INSULIN ASPART 100 UNIT/ML IV SOLN
10.0000 [IU] | Freq: Once | INTRAVENOUS | Status: AC
  Administered 2016-06-24: 10 [IU] via INTRAVENOUS
  Filled 2016-06-24: qty 1

## 2016-06-24 MED ORDER — CALCIUM GLUCONATE 10 % IV SOLN
1.0000 g | Freq: Once | INTRAVENOUS | Status: AC
  Administered 2016-06-24: 1 g via INTRAVENOUS
  Filled 2016-06-24: qty 10

## 2016-06-24 MED ORDER — SODIUM CHLORIDE 0.9% FLUSH: 3.0000 mL | INTRAVENOUS | Status: DC | PRN

## 2016-06-24 MED ORDER — FAMOTIDINE 20 MG PO TABS
20.0000 mg | ORAL_TABLET | Freq: Two times a day (BID) | ORAL | Status: DC
  Administered 2016-06-25 (×2): 20 mg via ORAL
  Filled 2016-06-24 (×2): qty 1

## 2016-06-24 MED ORDER — HEPARIN SODIUM (PORCINE) 5000 UNIT/ML IJ SOLN
5000.0000 [IU] | Freq: Three times a day (TID) | INTRAMUSCULAR | Status: DC
  Administered 2016-06-25 – 2016-06-26 (×3): 5000 [IU] via SUBCUTANEOUS
  Filled 2016-06-24 (×3): qty 1

## 2016-06-24 MED ORDER — SODIUM CHLORIDE 0.9% FLUSH
3.0000 mL | Freq: Two times a day (BID) | INTRAVENOUS | Status: DC
  Administered 2016-06-25: 3 mL via INTRAVENOUS

## 2016-06-24 MED ORDER — SODIUM BICARBONATE 8.4 % IV SOLN
50.0000 meq | Freq: Once | INTRAVENOUS | Status: AC
  Administered 2016-06-24: 50 meq via INTRAVENOUS
  Filled 2016-06-24: qty 50

## 2016-06-24 MED ORDER — SODIUM CHLORIDE 0.9% FLUSH
3.0000 mL | Freq: Two times a day (BID) | INTRAVENOUS | Status: DC
  Administered 2016-06-25 (×2): 3 mL via INTRAVENOUS

## 2016-06-24 MED ORDER — PANTOPRAZOLE SODIUM 40 MG PO TBEC
40.0000 mg | DELAYED_RELEASE_TABLET | Freq: Every day | ORAL | Status: DC
  Administered 2016-06-25 – 2016-06-26 (×2): 40 mg via ORAL
  Filled 2016-06-24 (×2): qty 1

## 2016-06-24 MED ORDER — ONDANSETRON HCL 4 MG/2ML IJ SOLN: 4.0000 mg | Freq: Four times a day (QID) | INTRAMUSCULAR | Status: DC | PRN

## 2016-06-24 MED ORDER — SODIUM POLYSTYRENE SULFONATE 15 GM/60ML PO SUSP
60.0000 g | Freq: Once | ORAL | Status: DC
  Filled 2016-06-24: qty 240

## 2016-06-24 MED ORDER — SODIUM CHLORIDE 0.9 % IV SOLN: 250.0000 mL | INTRAVENOUS | Status: DC | PRN

## 2016-06-24 MED ORDER — GABAPENTIN 100 MG PO CAPS
100.0000 mg | ORAL_CAPSULE | Freq: Three times a day (TID) | ORAL | Status: DC
Start: 2016-06-24 — End: 2016-06-26
  Administered 2016-06-25 – 2016-06-26 (×5): 100 mg via ORAL
  Filled 2016-06-24 (×5): qty 1

## 2016-06-24 MED ORDER — CINACALCET HCL 30 MG PO TABS
30.0000 mg | ORAL_TABLET | Freq: Every day | ORAL | Status: DC
  Administered 2016-06-25: 30 mg via ORAL
  Filled 2016-06-24 (×2): qty 1

## 2016-06-24 MED ORDER — ONDANSETRON HCL 4 MG/2ML IJ SOLN
4.0000 mg | Freq: Once | INTRAMUSCULAR | Status: AC
  Administered 2016-06-24: 4 mg via INTRAVENOUS
  Filled 2016-06-24: qty 2

## 2016-06-24 MED ORDER — ONDANSETRON HCL 4 MG PO TABS: 4.0000 mg | ORAL_TABLET | Freq: Four times a day (QID) | ORAL | Status: DC | PRN

## 2016-06-24 MED ORDER — PREDNISONE 10 MG PO TABS
10.0000 mg | ORAL_TABLET | Freq: Every day | ORAL | Status: DC
  Administered 2016-06-25 – 2016-06-26 (×2): 10 mg via ORAL
  Filled 2016-06-24 (×2): qty 1

## 2016-06-24 MED ORDER — AMLODIPINE BESYLATE 10 MG PO TABS
10.0000 mg | ORAL_TABLET | Freq: Two times a day (BID) | ORAL | Status: DC
  Administered 2016-06-25 – 2016-06-26 (×3): 10 mg via ORAL
  Filled 2016-06-24 (×3): qty 1

## 2016-06-24 NOTE — ED Provider Notes (Signed)
[facility] DEPT Provider Note   CSN: [id number] Arrival date & time: 06/24/16  1941     History   Chief Complaint Chief Complaint  Patient presents with  . Irregular Heart Beat  . Shortness of Breath    HPI Frank [name] is a 50 y.o. male.  He states that he had his scheduled dialysis today, but felt bad when he left dialysis. He describes intermittent chest pains which he rates at 7/10. He is not able to describe the pains any further. There is associated dyspnea and he has had some mild nausea and had one episode of diaphoresis. He relates that he had been in the hospital over the last 2 days for episodes of dialysis because of hyperkalemia. He denies fever or chills. Denies cough.   The history is provided by the patient.    Past Medical History:  Diagnosis Date  . Anemia   . Anxiety   . Chronic combined systolic and diastolic CHF (congestive heart failure) (HCC)    a. EF 20-25% by echo in 08/2015 b. echo 10/2015: EF 35-40%, diffuse HK, severe LAE, moderate RAE, small pericardial effusion  . Complication of anesthesia    itching, sore throat  . Depression   . Dialysis patient ([place])   . ESRD (end stage renal disease) ([place])    due to HTN per patient, followed at [facility], s/p failed kidney transplant - dialysis Tue, Th, Sat  . Hyperkalemia 12/2015  . Hypertension   . Junctional rhythm    a. noted in 08/2015: hyperkalemic at that time  b. 12/2015: presented in junctional rhythm w/ K+ of 6.6. Resolved with improvement of K+ levels.  . Nonischemic cardiomyopathy ([place])    a. 08/2014: cath showing minimal CAD, but tortuous arteries noted.   . Personal history of DVT (deep vein thrombosis)/ PE 05/26/2016   In Oct 2015 had small subsemental LUL PE w/o DVT (LE dopplers neg) and was felt to be HD cath related, treated w coumadin.  IN May 2016 had small vein DVT (acute/subacute) in the R basilic/ brachial veins of the RUE, resumed on coumadin.  Had R sided HD cath at that time.      . Renal insufficiency   . Shortness of breath   . Type II diabetes mellitus (HCC)    No history per patient, but remains under history as A1c would not be accurate given on dialysis    Patient Active Problem List   Diagnosis Date Noted  . Hypertension   . Dialysis patient, noncompliant ([place]) 06/22/2016  . ESRD (end stage renal disease) on dialysis ([place])   . ESRD (end stage renal disease) ([place]) 06/20/2016  . ESRD needing dialysis ([place]) 06/14/2016  . Uremia 06/07/2016  . Hyperkalemia 05/29/2016  . Chronic back pain 05/29/2016  . GERD (gastroesophageal reflux disease) 05/29/2016  . Personal history of DVT (deep vein thrombosis)/ PE 05/26/2016  . Bradycardia 03/31/2016  . Nonischemic cardiomyopathy ([place]) 01/09/2016  . Bilateral low back pain without sciatica   . Left renal mass 10/30/2015  . Constipation 10/30/2015  . Adjustment disorder with mixed anxiety and depressed mood 08/20/2015  . Chronic epididymitis 06/19/2015  . Groin pain, chronic, right 06/19/2015  . Incarcerated right inguinal hernia 02/16/2015  . Essential hypertension 01/02/2015  . Dyslipidemia   . ESRD on hemodialysis ([place])   . Anemia of chronic kidney failure 06/24/2013    Past Surgical History:  Procedure Laterality Date  . CAPD INSERTION    . CAPD REMOVAL    .  INGUINAL HERNIA REPAIR Right 02/14/2015   Procedure: REPAIR INCARCERATED RIGHT INGUINAL HERNIA;  Surgeon: [name], MD;  Location: [facility];  Service: General;  Laterality: Right;  . INSERTION OF DIALYSIS CATHETER Right 09/23/2015   Procedure: exchange of Right internal Dialysis Catheter.;  Surgeon: [name], MD;  Location: [facility];  Service: Vascular;  Laterality: Right;  . KIDNEY RECEIPIENT  2006   failed and started HD in March 2014  . LEFT HEART CATHETERIZATION WITH CORONARY ANGIOGRAM N/A 09/02/2014   Procedure: LEFT HEART CATHETERIZATION WITH CORONARY ANGIOGRAM;  Surgeon: [name], MD;  Location: [facility] CATH LAB;  Service: Cardiovascular;   Laterality: N/A;       Home Medications    Prior to Admission medications   Medication Sig Start Date End Date Taking? Authorizing Provider  allopurinol (ZYLOPRIM) 100 MG tablet Take 100 mg by mouth daily.    Historical Provider, MD  amLODipine (NORVASC) 10 MG tablet Take 10 mg by mouth 2 (two) times daily.     Historical Provider, MD  atorvastatin (LIPITOR) 40 MG tablet Take 1 tablet (40 mg total) by mouth daily at 6 PM. 09/02/14   Barton Dubois, MD  carvedilol (COREG) 25 MG tablet Take 25 mg by mouth 2 (two) times daily with a meal.    Historical Provider, MD  cinacalcet (SENSIPAR) 30 MG tablet Take 30 mg by mouth every evening.     Historical Provider, MD  cloNIDine (CATAPRES - DOSED IN MG/24 HR) 0.3 mg/24hr patch Place 0.3 mg onto the skin once a week.    Historical Provider, MD  doxazosin (CARDURA) 2 MG tablet Take 2 mg by mouth daily.    Historical Provider, MD  famotidine (PEPCID) 20 MG tablet Take 1 tablet (20 mg total) by mouth 2 (two) times daily. 02/13/16   Charlesetta Shanks, MD  gabapentin (NEURONTIN) 100 MG capsule Take 1 capsule (100 mg total) by mouth at bedtime. Patient taking differently: Take 100 mg by mouth 3 (three) times daily.  05/21/16   [name], DO  hydrALAZINE (APRESOLINE) 100 MG tablet Take 100 mg by mouth 2 (two) times daily.     Historical Provider, MD  isosorbide mononitrate (IMDUR) 60 MG 24 hr tablet Take 1 tablet (60 mg total) by mouth daily. 01/09/16   Smiley Houseman, MD  omeprazole (PRILOSEC) 20 MG capsule Take 20 mg by mouth daily. 07/01/13   Historical Provider, MD  ondansetron (ZOFRAN ODT) 4 MG disintegrating tablet Take 1 tablet (4 mg total) by mouth every 8 (eight) hours as needed for nausea or vomiting. 05/16/16   Sherwood Gambler, MD  oxycodone (OXY-IR) 5 MG capsule Take 1 capsule (5 mg total) by mouth every 4 (four) hours as needed for pain. 05/30/16   [name], MD  oxyCODONE-acetaminophen (PERCOCET/ROXICET) 5-325 MG tablet Take 1 tablet by  mouth every 6 (six) hours as needed for severe pain.    Historical Provider, MD  predniSONE (DELTASONE) 10 MG tablet Take 10 mg by mouth daily with breakfast.    Historical Provider, MD  sevelamer carbonate (RENVELA) 800 MG tablet Take 1,600 mg by mouth 3 (three) times daily with meals.     Historical Provider, MD  warfarin (COUMADIN) 2 MG tablet Take 2 mg by mouth daily. Take with 51m tablet to equal 760m7/12/17   Historical Provider, MD  warfarin (COUMADIN) 5 MG tablet Take 5 mg by mouth See admin instructions. Take 1 tablet (5 mg) by mouth with a 2 mg tablet  for a 7 mg dose 01/31/16   Historical Provider, MD    Family History Family History  Problem Relation Age of Onset  . Hypertension Other     Social History Social History  Substance Use Topics  . Smoking status: Former Smoker    Packs/day: 0.00    Years: 1.00    Types: Cigarettes  . Smokeless tobacco: Never Used     Comment: quit Jan 2014  . Alcohol use No     Allergies   Butalbital-apap-caffeine; Ferrlecit [na ferric gluc cplx in sucrose]; Minoxidil; and Darvocet [propoxyphene n-acetaminophen]   Review of Systems Review of Systems  All other systems reviewed and are negative.    Physical Exam Updated Vital Signs BP 108/74 (BP Location: Left Arm)   Pulse (!) 42   Resp 16   SpO2 100%   Physical Exam  Nursing note and vitals reviewed.  50 year old male, resting comfortably and in no acute distress. Vital signs are Significant for bradycardia. Oxygen saturation is 100%, which is normal. Head is normocephalic and atraumatic. PERRLA, EOMI. Oropharynx is clear. Neck is nontender and supple without adenopathy or JVD. Back is nontender and there is no CVA tenderness. Lungs are clear without rales, wheezes, or rhonchi. Chest is nontender. Dialysis access catheters present in the right subclavian area. Heart has regular rate and rhythm without murmur. Abdomen is soft, flat, nontender without masses or  hepatosplenomegaly and peristalsis is normoactive. Extremities have no cyanosis or edema, full range of motion is present. Skin is warm and dry without rash. Neurologic: Mental status is normal, cranial nerves are intact, there are no motor or sensory deficits.  ED Treatments / Results  Labs (all labs ordered are listed, but only abnormal results are displayed) Labs Reviewed  BASIC METABOLIC PANEL - Abnormal; Notable for the following:       Result Value   Sodium 133 (*)    Potassium 7.4 (*)    Chloride 97 (*)    Glucose, Bld 134 (*)    BUN 33 (*)    Creatinine, Ser 10.25 (*)    Calcium 8.1 (*)    GFR calc non Af Amer 5 (*)    GFR calc Af Amer 6 (*)    All other components within normal limits  CBC - Abnormal; Notable for the following:    RBC 3.57 (*)    Hemoglobin 10.0 (*)    HCT 31.7 (*)    RDW 16.2 (*)    All other components within normal limits  HEPATIC FUNCTION PANEL - Abnormal; Notable for the following:    Albumin 3.4 (*)    ALT 12 (*)    Alkaline Phosphatase 145 (*)    Bilirubin, Direct <0.1 (*)    All other components within normal limits  DIFFERENTIAL - Abnormal; Notable for the following:    Lymphs Abs 0.6 (*)    All other components within normal limits  PROTIME-INR - Abnormal; Notable for the following:    Prothrombin Time 15.4 (*)    All other components within normal limits  I-STAT TROPOININ, ED    EKG  EKG Interpretation  Date/Time:  Monday June 24 2016 19:52:07 EST Ventricular Rate:  41 PR Interval:  214 QRS Duration: 98 QT Interval:  580 QTC Calculation: 478 R Axis:   -61 Text Interpretation:  Marked sinus bradycardia with sinus arrhythmia with 1st degree A-V block Left anterior fascicular block Left ventricular hypertrophy with repolarization abnormality Abnormal ECG When compared with ECG of  06/19/2016, HEART RATE has decreased Confirmed by [facility]  MD, Ashok Sawaya ([id number]) on 06/24/2016 8:02:40 PM       Procedures Procedures (including  critical care time) CRITICAL CARE Performed by: [email or web address] Total critical care time: 50 minutes Critical care time was exclusive of separately billable procedures and treating other patients. Critical care was necessary to treat or prevent imminent or life-threatening deterioration. Critical care was time spent personally by me on the following activities: development of treatment plan with patient and/or surrogate as well as nursing, discussions with consultants, evaluation of patient's response to treatment, examination of patient, obtaining history from patient or surrogate, ordering and performing treatments and interventions, ordering and review of laboratory studies, ordering and review of radiographic studies, pulse oximetry and re-evaluation of patient's condition.  Medications Ordered in ED Medications  calcium gluconate 1 g in sodium chloride 0.9 % 100 mL IVPB (1 g Intravenous New Bag/Given 06/24/16 2140)  sodium polystyrene (KAYEXALATE) 15 GM/60ML suspension 60 g (not administered)  dextrose 50 % solution 50 mL (50 mLs Intravenous Given 06/24/16 2135)  insulin aspart (novoLOG) injection 10 Units (10 Units Intravenous Given 06/24/16 2134)  sodium bicarbonate injection 50 mEq (50 mEq Intravenous Given 06/24/16 2134)     Initial Impression / Assessment and Plan / ED Course  I have reviewed the triage vital signs and the nursing notes.  Pertinent lab results that were available during my care of the patient were reviewed by me and considered in my medical decision making (see chart for details).  Clinical Course    Chest pain and malaise with bradycardia. Old records are reviewed confirming discharged from a hospital earlier today following admission for hemodialysis for hyperkalemia. Of note, he has history of DVT and pulmonary embolism and INR was subtherapeutic throughout his hospital stay. Most recent INR was 1.17 this morning. ECG showed sinus bradycardia. Heart rate his to the low  to mid 50s when I was in the room with him. We'll check screening labs.  Potassium is come back markedly elevated at 7.4. Treatment for hyperkalemia was initiated including D50, insulin, calcium, sodium bicarbonate and sodium polystyrene. Case is discussed with Dr. Joelyn Oms of nephrology service who agrees to do emergent hemodialysis and requests patient be admitted to the medicine service. Case is discussed with Dr. Myna Hidalgo of [facility] who agrees to admit the patient to stepdown unit. Of note, INR is still subtherapeutic and this will need to be addressed.  Final Clinical Impressions(s) / ED Diagnoses   Final diagnoses:  Hyperkalemia  End-stage renal disease on hemodialysis ([place])  Anemia associated with chronic renal failure  Subtherapeutic international normalized ratio (INR)    New Prescriptions New Prescriptions   No medications on file     Delora Fuel, MD 15/61/53 [id number]

## 2016-06-24 NOTE — Final Progress Note (Signed)
Patient discharge teaching given, including activity, diet, follow-up appoints, and medications. Patient verbalized understanding of all discharge instructions. IV access was d/c'd. Vitals are stable. Skin is intact except as charted in most recent assessments.

## 2016-06-24 NOTE — ED Triage Notes (Addendum)
Pt had dialysis treatment today, reports having more fluid removed today that normal. C/o weakness and irregular heart rate on home monitor. Unable to obtain an axillary or oral temp in triage. Pulse noted to be 30-40s, baseline pulse is 70-80. Hx of PE per patient and requesting to be checked for blood clots

## 2016-06-24 NOTE — H&P (Signed)
History and Physical    Frank [name] EUM:[id number] DOB: October 17, 1965 DOA: 06/24/2016  PCP: Angelica Chessman, MD   Patient coming from: Home  Chief Complaint: Fatigue, palpitations  HPI: Frank [name] is a 50 y.o. male with medical history significant for end-stage renal disease on hemodialysis, history of DVT and PE, nonischemic cardiomyopathy, hypertension, GERD, and nonadherence with his treatment plan who presents to the emergency department with fatigue and palpitations that began just prior to arrival. Patient has recently been dependent on the hospital for his hemodialysis and was dialyzed earlier today and yesterday in the hospital. Just prior to his inpatient dialysis session on 06/23/2016, the patient had developed severe hyperkalemia with wide complex bradyarrhythmia. He was discharged this afternoon in much improved and stable condition, but developed the aforementioned symptoms, and now returns for evaluation. Potassium level was 3.6 this morning prior to discharge. Patient denies eating or drinking anything from the time of hospital discharge up until his presentation tonight. He denies recent fevers or chills and denies cough or dyspnea.  ED Course: Upon arrival to the ED, patient is found to be saturating well on room air, bradycardic in the high 30s - low 40s, and with soft but stable blood pressure. EKG was obtained and features a sinus bradyarrhythmia with rate 41, first degree AV nodal block, left anterior fascicular block, and LVH with repolarization abnormality. Chemistry panel reveals a potassium of 7.4 with mild hyponatremia and hypochloremia, and stable BUN. CBC features a stable normocytic anemia with hemoglobin of 10.0, INR is subtherapeutic at 1.22, and troponin is negative at 0.02. Patient was treated with dextrose and insulin, bicarbonate, and Kayexalate in the emergency department. Heart rate has increased into the 60s and his blood pressure remains stable. Nephrology was  consulted by the ED physician and is evaluating the patient currently in the emergency department. A medical admission has been requested by nephrology and the patient will be admitted to the stepdown unit for ongoing evaluation and management of critical hyperkalemia with EKG changes in an ESRD patient.  Review of Systems:  All other systems reviewed and apart from HPI, are negative.  Past Medical History:  Diagnosis Date  . Anemia   . Anxiety   . Chronic combined systolic and diastolic CHF (congestive heart failure) (HCC)    a. EF 20-25% by echo in 08/2015 b. echo 10/2015: EF 35-40%, diffuse HK, severe LAE, moderate RAE, small pericardial effusion  . Complication of anesthesia    itching, sore throat  . Depression   . Dialysis patient ([place])   . ESRD (end stage renal disease) ([place])    due to HTN per patient, followed at [facility], s/p failed kidney transplant - dialysis Tue, Th, Sat  . Hyperkalemia 12/2015  . Hypertension   . Junctional rhythm    a. noted in 08/2015: hyperkalemic at that time  b. 12/2015: presented in junctional rhythm w/ K+ of 6.6. Resolved with improvement of K+ levels.  . Nonischemic cardiomyopathy ([place])    a. 08/2014: cath showing minimal CAD, but tortuous arteries noted.   . Personal history of DVT (deep vein thrombosis)/ PE 05/26/2016   In Oct 2015 had small subsemental LUL PE w/o DVT (LE dopplers neg) and was felt to be HD cath related, treated w coumadin.  IN May 2016 had small vein DVT (acute/subacute) in the R basilic/ brachial veins of the RUE, resumed on coumadin.  Had R sided HD cath at that time.    . Renal insufficiency   .  Shortness of breath   . Type II diabetes mellitus (HCC)    No history per patient, but remains under history as A1c would not be accurate given on dialysis    Past Surgical History:  Procedure Laterality Date  . CAPD INSERTION    . CAPD REMOVAL    . INGUINAL HERNIA REPAIR Right 02/14/2015   Procedure: REPAIR INCARCERATED RIGHT  INGUINAL HERNIA;  Surgeon: [name], MD;  Location: [place];  Service: General;  Laterality: Right;  . INSERTION OF DIALYSIS CATHETER Right 09/23/2015   Procedure: exchange of Right internal Dialysis Catheter.;  Surgeon: [name], MD;  Location: [place];  Service: Vascular;  Laterality: Right;  . KIDNEY RECEIPIENT  2006   failed and started HD in March 2014  . LEFT HEART CATHETERIZATION WITH CORONARY ANGIOGRAM N/A 09/02/2014   Procedure: LEFT HEART CATHETERIZATION WITH CORONARY ANGIOGRAM;  Surgeon: [name], MD;  Location: [facility] C[facility] LAB;  Service: Cardiovascular;  Laterality: N/A;     reports that he has quit smoking. His smoking use included Cigarettes. He smoked 0.00 packs per day for 1.00 year. He has never used smokeless tobacco. He reports that he does not drink alcohol or use drugs.  Allergies  Allergen Reactions  . Butalbital-Apap-Caffeine Shortness Of Breath, Swelling and Other (See Comments)    Swelling in throat  . Ferrlecit [Na Ferric Gluc Cplx In Sucrose] Shortness Of Breath, Swelling and Other (See Comments)    Swelling in throat  . Minoxidil Shortness Of Breath  . Darvocet [Propoxyphene N-Acetaminophen] Hives    Family History  Problem Relation Age of Onset  . Hypertension Other      Prior to Admission medications   Medication Sig Start Date End Date Taking? Authorizing Provider  allopurinol (ZYLOPRIM) 100 MG tablet Take 100 mg by mouth daily.    Historical Provider, MD  amLODipine (NORVASC) 10 MG tablet Take 10 mg by mouth 2 (two) times daily.     Historical Provider, MD  atorvastatin (LIPITOR) 40 MG tablet Take 1 tablet (40 mg total) by mouth daily at 6 PM. 09/02/14   Barton Dubois, MD  carvedilol (COREG) 25 MG tablet Take 25 mg by mouth 2 (two) times daily with a meal.    Historical Provider, MD  cinacalcet (SENSIPAR) 30 MG tablet Take 30 mg by mouth every evening.     Historical Provider, MD  cloNIDine (CATAPRES - DOSED IN MG/24 HR) 0.3 mg/24hr patch Place  0.3 mg onto the skin once a week.    Historical Provider, MD  doxazosin (CARDURA) 2 MG tablet Take 2 mg by mouth daily.    Historical Provider, MD  famotidine (PEPCID) 20 MG tablet Take 1 tablet (20 mg total) by mouth 2 (two) times daily. 02/13/16   Charlesetta Shanks, MD  gabapentin (NEURONTIN) 100 MG capsule Take 1 capsule (100 mg total) by mouth at bedtime. Patient taking differently: Take 100 mg by mouth 3 (three) times daily.  05/21/16   [name], DO  hydrALAZINE (APRESOLINE) 100 MG tablet Take 100 mg by mouth 2 (two) times daily.     Historical Provider, MD  isosorbide mononitrate (IMDUR) 60 MG 24 hr tablet Take 1 tablet (60 mg total) by mouth daily. 01/09/16   Smiley Houseman, MD  omeprazole (PRILOSEC) 20 MG capsule Take 20 mg by mouth daily. 07/01/13   Historical Provider, MD  ondansetron (ZOFRAN ODT) 4 MG disintegrating tablet Take 1 tablet (4 mg total) by mouth every 8 (eight) hours as  needed for nausea or vomiting. 05/16/16   Sherwood Gambler, MD  oxycodone (OXY-IR) 5 MG capsule Take 1 capsule (5 mg total) by mouth every 4 (four) hours as needed for pain. 05/30/16   [name], MD  oxyCODONE-acetaminophen (PERCOCET/ROXICET) 5-325 MG tablet Take 1 tablet by mouth every 6 (six) hours as needed for severe pain.    Historical Provider, MD  predniSONE (DELTASONE) 10 MG tablet Take 10 mg by mouth daily with breakfast.    Historical Provider, MD  sevelamer carbonate (RENVELA) 800 MG tablet Take 1,600 mg by mouth 3 (three) times daily with meals.     Historical Provider, MD  warfarin (COUMADIN) 2 MG tablet Take 2 mg by mouth daily. Take with 92m tablet to equal 738m7/12/17   Historical Provider, MD  warfarin (COUMADIN) 5 MG tablet Take 5 mg by mouth See admin instructions. Take 1 tablet (5 mg) by mouth with a 2 mg tablet for a 7 mg dose 01/31/16   Historical Provider, MD    Physical Exam: Vitals:   06/24/16 1954 06/24/16 1958  BP: 108/74 108/74  Pulse: (!) 38 (!) 42  Resp: 16 16  SpO2:  100% 100%      Constitutional: NAD, calm, comfortable, chronically-ill in appearance Eyes: PERTLA, lids and conjunctivae normal ENMT: Mucous membranes are moist. Posterior pharynx clear of any exudate or lesions.   Neck: normal, supple, no masses, no thyromegaly Respiratory: clear to auscultation bilaterally, no wheezing, no crackles. Normal respiratory effort.  Cardiovascular: Rate ~45 and regular. No extremity edema. No significant JVD. Abdomen: No distension, no tenderness, no masses palpated. Bowel sounds normal.  Musculoskeletal: no clubbing / cyanosis. No joint deformity upper and lower extremities. Normal muscle tone.  Skin: no significant rashes, lesions, ulcers. Warm, dry, well-perfused. Catheter in right chest c/d/i. Neurologic: CN 2-12 grossly intact. Sensation intact, DTR normal. Strength 5/5 in all 4 limbs.  Psychiatric: Normal judgment and insight. Alert and oriented x 3. Poor cooperation with interview and exam.     Labs on Admission: I have personally reviewed following labs and imaging studies  CBC:  Recent Labs Lab 06/19/16 0726 06/20/16 0736 06/22/16 1052 06/23/16 1255 06/24/16 0557 06/24/16 1004 06/24/16 2009  WBC 3.6* 3.6*  --  4.8 4.5  --  4.2  NEUTROABS 2.0  --   --   --  2.8  --  3.4  HGB 11.2* 11.1* 11.2* 10.0* 9.9* 10.9* 10.0*  HCT 35.0* 35.3* 33.0* 30.9* 31.0* 32.0* 31.7*  MCV 90.4 90.7  --  87.3 87.3  --  88.8  PLT 211 197  --  263 260  --  19161 Basic Metabolic Panel:  Recent Labs Lab 06/19/16 0726 06/20/16 0736 06/22/16 1052 06/23/16 0521 06/23/16 1257 06/24/16 1004 06/24/16 2009  NA 135 136 136 136 135 139 133*  K 5.3* 5.4* 6.2* 6.5* >7.5* 3.6 7.4*  CL 96* 99* 104 98* 100* 98* 97*  CO2 26 24  --  22 22  --  24  GLUCOSE 169* 131* 106* 65 90 104* 134*  BUN 20 31* 61* 72* 74* 28* 33*  CREATININE 8.20* 10.73* 16.00* 16.59* 16.78* 8.20* 10.25*  CALCIUM 8.8* 8.8*  --  7.6* 7.8*  --  8.1*  PHOS  --   --   --  6.2* 5.9*  --   --     GFR: Estimated Creatinine Clearance: 9 mL/min (by C-G formula based on SCr of 10.25 mg/dL (H)). Liver Function Tests:  Recent Labs Lab  06/23/16 1257 06/24/16 2014  AST  --  23  ALT  --  12*  ALKPHOS  --  145*  BILITOT  --  0.3  PROT  --  7.2  ALBUMIN 2.7* 3.4*   No results for input(s): LIPASE, AMYLASE in the last 168 hours. No results for input(s): AMMONIA in the last 168 hours. Coagulation Profile:  Recent Labs Lab 06/18/16 0422 06/19/16 0726 06/23/16 0521 06/24/16 0557 06/24/16 2009  INR 1.11 1.20 1.15 1.17 1.22   Cardiac Enzymes:  Recent Labs Lab 06/19/16 0726  TROPONINI 0.03*   BNP (last 3 results) No results for input(s): PROBNP in the last 8760 hours. HbA1C: No results for input(s): HGBA1C in the last 72 hours. CBG:  Recent Labs Lab 06/23/16 0754 06/24/16 0743  GLUCAP 98 134*   Lipid Profile: No results for input(s): CHOL, HDL, LDLCALC, TRIG, CHOLHDL, LDLDIRECT in the last 72 hours. Thyroid Function Tests: No results for input(s): TSH, T4TOTAL, FREET4, T3FREE, THYROIDAB in the last 72 hours. Anemia Panel: No results for input(s): VITAMINB12, FOLATE, FERRITIN, TIBC, IRON, RETICCTPCT in the last 72 hours. Urine analysis:    Component Value Date/Time   COLORURINE YELLOW 10/18/2013 0419   APPEARANCEUR CLEAR 10/18/2013 0419   LABSPEC 1.008 10/18/2013 0419   PHURINE 8.5 (H) 10/18/2013 0419   GLUCOSEU 100 (A) 10/18/2013 0419   HGBUR TRACE (A) 10/18/2013 0419   BILIRUBINUR NEGATIVE 10/18/2013 0419   KETONESUR NEGATIVE 10/18/2013 0419   PROTEINUR 100 (A) 10/18/2013 0419   UROBILINOGEN 0.2 10/18/2013 0419   NITRITE NEGATIVE 10/18/2013 0419   LEUKOCYTESUR NEGATIVE 10/18/2013 0419   Sepsis Labs: _0 (procalcitonin:4,lacticidven:4) ) Recent Results (from the past 240 hour(s))  MRSA PCR Screening     Status: None   Collection Time: 06/17/16 12:29 PM  Result Value Ref Range Status   MRSA by PCR NEGATIVE NEGATIVE Final    Comment:         The GeneXpert MRSA Assay (FDA approved for NASAL specimens only), is one component of a comprehensive MRSA colonization surveillance program. It is not intended to diagnose MRSA infection nor to guide or monitor treatment for MRSA infections.      Radiological Exams on Admission: No results found.  EKG: Independently reviewed. Sinus bradyarrhythmia (rate 41), 1st deg AV block, LAFB, LVH with repol abnormality.   Assessment/Plan  1. Hyperkalemia with bradyarrhythmia  - Pt presents with palpitations and fatigue and found to have potassium of 7.4 with bradyarrhythmia and rate 38-41  - He was just dialyzed on 12/3 and 12/4; serum potassium was 3.6 the am of 12/4 prior to discharge and pt denies eating or drinking anything in the time between discharge and current presentation  - Temporizing measures given in ED and HR is coming up to 60s in ED  - Nephrology is consulting and much appreciated; orders are placed for inpatient HD  - Plan to monitor on telemetry with serial potassium levels until HD can be performed   2. ESRD on HD  - Pt has not yet established with outpatient HD clinic and has been dependent on hospital for HD  - He has AVF which he reports not yet fully matured and has been dialyzing through catheter in right chest  - Nephrology has arranged for HD on admission    3. Hx of DVT, PE  - There is no evidence of acute VTE  - Pt is prescribed warfarin, but is not adherent  - INR is 1.22 on admission and he will be given  sq heparin at ppx dosing, encouraged toward better adherence with AC   4. Hypertension  - BP is soft on admission but has remained stable  - He is managed at home with Norvasc, clonidine, Coreg, hydralazine; these are held for now given soft BP and will be resumed as appropriate    5. Normocytic anemia  - Hgb is 10.0 on admission and apparently stable with no evidence for bleeding   6. GERD - No EGD report on file  - Managed with Pepcid and Protonix  at home and these will be continued     DVT prophylaxis: sq heparin, SCDs; prescribed warfarin, but not compliant Code Status: Full  Family Communication: Discussed with patient Disposition Plan: Admit to stepdown Consults called: Nephrology Admission status: Inpatient    Vianne Bulls, MD [facility] Pager [phone]  If 7PM-7AM, please contact night-coverage [email or web address] Password [email or web address]  06/24/2016, 10:00 PM

## 2016-06-24 NOTE — Procedures (Signed)
Patient was seen on dialysis and the procedure was supervised.  BFR 300  Via PC BP is  122/75.   Patient appears to be tolerating treatment well  Frank [name] A 06/24/2016

## 2016-06-24 NOTE — Progress Notes (Signed)
Pt discharged earlier today. ESRD without outpt HD unit.  Admitted 12/3 with EKG changes (bradycardia) and severe hyperkalemia.  Received HD x2 12/3 and earlier today (today K was 3.6, 2K bath, 2.5 Tx -- pt s/o early, TDC).    He returned to ED this evening with fatigue and SB in 30s and 40s. K is 7.4. HCO3 24, Na 133, BUN 33.  EKG SB with QRS around 100, T waves not peaked.   At first he says he did nothing from discharge to representation -- but eventually says drank lemonade and ate chicken salad; not sure that is sufficienctly explanatory.    AF, normotensive. ED ordered Cagluc, Kayexalate, Insulin/dextrose, NaHCO3. On my exam HR in 60s and 70s; narrow complex on telemetry.  Lungs clear. No edema. AVF +B/T  Plan for HD this evening.  Start with 1K bath, check q30mn.   Such a rapid change in K suggests  Possible immediate exogenous intake upon discharge.

## 2016-06-24 NOTE — ED Notes (Signed)
Report to [facility] on 3W, stepdown unit.  Pt to unit with this RN accompanying.

## 2016-06-24 NOTE — Progress Notes (Signed)
[facility] Progress Note   Subjective: HD last night and on for this AM as well.  Last HD prior to this was 11/28 (Tuesday )    Vitals:   06/24/16 0830 06/24/16 0900 06/24/16 0930 06/24/16 1000  BP: (!) 150/88 (!) 157/92 127/77 124/79  Pulse: 67 70 62 63  Resp:      Temp:      TempSrc:      SpO2:      Weight:        Inpatient medications: . allopurinol  100 mg Oral Daily  . amLODipine  10 mg Oral BID  . atorvastatin  40 mg Oral q1800  . carvedilol  25 mg Oral BID WC  . cinacalcet  30 mg Oral QPM  . cloNIDine  0.3 mg Transdermal Weekly  . doxazosin  2 mg Oral Daily  . famotidine  20 mg Oral Daily  . gabapentin  100 mg Oral TID  . hydrALAZINE  100 mg Oral BID  . isosorbide mononitrate  60 mg Oral Daily  . pantoprazole  40 mg Oral Q1200  . predniSONE  10 mg Oral Q breakfast  . sevelamer carbonate  1,600 mg Oral TID WC  . sodium chloride flush  3 mL Intravenous Q12H  . sodium chloride flush  3 mL Intravenous Q12H  . sodium polystyrene  15 g Oral Once  . Warfarin - Pharmacist Dosing Inpatient   Does not apply q1800     Exam: Gen pale,  no distress Dry skin No jvd or bruits Chest clear bilat RRR no MRG Abd soft ntnd no ascites Ext no LE or UEedema Neuro is alert, Ox 3 , nf LUA AVF maturing/ R IJ cath    Dialysis:patient has no OP HD unit. Dry wt "160 lbs" per pt.     Assessment: 1. Hyperkalemia - severe w EKG changes on HD just now. Resolvrf with HD.  2. ESRD - has not outpatient HD unit currently; will be accepted by [facility] (he has d/w Dr Florene Glen last week per pt) that when AVF is in use he can start HD in [place]. I will confirm.  For now I think is probably OK for discharge  3. Hx DVT/ PE - has had 2 episodes (1 LUL PE in 2015 felt to be HD-cath related, the other was RUE brachial/ basilic thrombosis 4401 w/o PE); possible that these episodes were all related to his chronic indwelling HD cath. If he ever gets a working fistula  and HD cath is removed, would consider coumadin withdrawal.  4. NICM - EF 35-40%   5. Anemia - getting darbe here approx weekly, 100-150 ug, Hb up 11-12 range, so hold for now. 11/8 tsat was 22% but pt refused IV iron; he has allergy to ferric gluconate but rec'd Feraheme x 1 in Aug '17 w/o incident 6. HTN on 5 bp meds 7. MBD - Ca ok, check phos/ pth    Maryssa Giampietro A  06/24/2016, 10:43 AM    Recent Labs Lab 06/20/16 0736  06/23/16 0521 06/23/16 1257 06/24/16 1004  NA 136  < > 136 135 139  K 5.4*  < > 6.5* >7.5* 3.6  CL 99*  < > 98* 100* 98*  CO2 24  --  22 22  --   GLUCOSE 131*  < > 65 90 104*  BUN 31*  < > 72* 74* 28*  CREATININE 10.73*  < > 16.59* 16.78* 8.20*  CALCIUM 8.8*  --  7.6* 7.8*  --  PHOS  --   --  6.2* 5.9*  --   < > = values in this interval not displayed.  Recent Labs Lab 06/23/16 1257  ALBUMIN 2.7*    Recent Labs Lab 06/19/16 0726 06/20/16 0736  06/23/16 1255 06/24/16 0557 06/24/16 1004  WBC 3.6* 3.6*  --  4.8 4.5  --   NEUTROABS 2.0  --   --   --  2.8  --   HGB 11.2* 11.1*  < > 10.0* 9.9* 10.9*  HCT 35.0* 35.3*  < > 30.9* 31.0* 32.0*  MCV 90.4 90.7  --  87.3 87.3  --   PLT 211 197  --  263 260  --   < > = values in this interval not displayed. Iron/TIBC/Ferritin/ %Sat    Component Value Date/Time   IRON 41 (L) 05/29/2016 1350   TIBC 190 (L) 05/29/2016 1350   FERRITIN 328 11/02/2015 0346   IRONPCTSAT 22 05/29/2016 1350

## 2016-06-24 NOTE — Discharge Summary (Addendum)
Physician Discharge Summary  Frank [name]  RVU:[id number]  DOB: [birth year]  DOA: 06/22/2016 PCP: Angelica Chessman, MD  Admit date: 06/22/2016 Discharge date: 06/24/2016  Admitted From: Home Disposition: Home   Recommendations for Outpatient Follow-up:  1. Follow up with PCP in 1-2 weeks 2. 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 Patient does not have a dialysis center for which he comes to the hospital and to get his dialysis. On evaluation in the ED he was found to be hyperkalemic potassium of 6.2 and blood pressures ranging the 180s/100s. Patient was admitted for dialysis and nephrology was consulted. Patient is noncompliant with medications as he is not taking warfarin as indicated. He was ordered Kayexalate but patient refused and repeat potassium was 7.5. During dialysis patient was with wide QRS and heart rate in the low 30s. He got half session of HD on 12/3 and a full session on 12/4. Patient have a permanent access and apparently has been accepted at [facility], but when the AVF is mature. Patient did not have any complaints during hospital stay. Patient will be discharge home with outpatient follow up with Dr Florene Glen.   Subjective: Patient seen during dialysis, asking when he can go home. Patient asking about potassium diet. No complaints. Afebrile and tolerating diet well.   Discharge Diagnoses:  Hyperkalemia severe with EKG changes seen during hemodialysis - Resolved  Low K diet discussed at length.  Continue dialysis as recommended  Monitor BMP pre dialysis   ESRD  Hemodialysis as scheduled by  nephrologist.  Was dialyzed x2  12/3 and 12/4  Hx of DVT- likely related to HD catheter. Continue home Coumadin Patient noncompliant as an outpatient with therapy, one fistulous working consider to stop Coumadin.  GERD Continue PPIs    Hypertension - Improved after HD  Continue home medications. Follow up with PCP   Anemia chronic disease secondary to CKD   Stable Epo and Fe per nephrologist   Discharge Instructions  Discharge Instructions    Call MD for:  difficulty breathing, headache or visual disturbances    Complete by:  As directed    Call MD for:  extreme fatigue    Complete by:  As directed    Call MD for:  hives    Complete by:  As directed    Call MD for:  persistant dizziness or light-headedness    Complete by:  As directed    Call MD for:  persistant nausea and vomiting    Complete by:  As directed    Call MD for:  redness, tenderness, or signs of infection (pain, swelling, redness, odor or green/yellow discharge around incision site)    Complete by:  As directed    Call MD for:  severe uncontrolled pain    Complete by:  As directed    Call MD for:  temperature >100.4    Complete by:  As directed    Diet - low sodium heart healthy    Complete by:  As directed    Discharge instructions    Complete by:  As directed    Increase activity slowly    Complete by:  As directed  Medication List    STOP taking these medications   sulfamethoxazole-trimethoprim 400-80 MG tablet Commonly known as:  BACTRIM,SEPTRA     TAKE these medications   allopurinol 100 MG tablet Commonly known as:  ZYLOPRIM Take 100 mg by mouth daily.   amLODipine 10 MG tablet Commonly known as:  NORVASC Take 10 mg by mouth 2 (two) times daily.   atorvastatin 40 MG tablet Commonly known as:  LIPITOR Take 1 tablet (40 mg total) by mouth daily at 6 PM.   carvedilol 25 MG tablet Commonly known as:  COREG Take 25 mg by mouth 2 (two) times daily with a meal.   cinacalcet  30 MG tablet Commonly known as:  SENSIPAR Take 30 mg by mouth every evening.   cloNIDine 0.3 mg/24hr patch Commonly known as:  CATAPRES - Dosed in mg/24 hr Place 0.3 mg onto the skin once a week.   doxazosin 2 MG tablet Commonly known as:  CARDURA Take 2 mg by mouth daily.   famotidine 20 MG tablet Commonly known as:  PEPCID Take 1 tablet (20 mg total) by mouth 2 (two) times daily.   gabapentin 100 MG capsule Commonly known as:  NEURONTIN Take 1 capsule (100 mg total) by mouth at bedtime. What changed:  when to take this   hydrALAZINE 100 MG tablet Commonly known as:  APRESOLINE Take 100 mg by mouth 2 (two) times daily.   isosorbide mononitrate 60 MG 24 hr tablet Commonly known as:  IMDUR Take 1 tablet (60 mg total) by mouth daily.   omeprazole 20 MG capsule Commonly known as:  PRILOSEC Take 20 mg by mouth daily.   ondansetron 4 MG disintegrating tablet Commonly known as:  ZOFRAN ODT Take 1 tablet (4 mg total) by mouth every 8 (eight) hours as needed for nausea or vomiting.   oxycodone 5 MG capsule Commonly known as:  OXY-IR Take 1 capsule (5 mg total) by mouth every 4 (four) hours as needed for pain.   oxyCODONE-acetaminophen 5-325 MG tablet Commonly known as:  PERCOCET/ROXICET Take 1 tablet by mouth every 6 (six) hours as needed for severe pain.   predniSONE 10 MG tablet Commonly known as:  DELTASONE Take 10 mg by mouth daily with breakfast.   sevelamer carbonate 800 MG tablet Commonly known as:  RENVELA Take 1,600 mg by mouth 3 (three) times daily with meals.   warfarin 2 MG tablet Commonly known as:  COUMADIN Take 2 mg by mouth daily. Take with 83m tablet to equal 734m  warfarin 5 MG tablet Commonly known as:  COUMADIN Take 5 mg by mouth See admin instructions. Take 1 tablet (5 mg) by mouth with a 2 mg tablet for a 7 mg dose      Follow-up Information    JEGEDE, OLUGBEMIGA, MD. Schedule an appointment as soon as possible for a visit in 1 week(s).    Specialty:  Internal Medicine Contact information: [phone]        Allergies  Allergen Reactions  . Butalbital-Apap-Caffeine Shortness Of Breath, Swelling and Other (See Comments)    Swelling in throat  . Ferrlecit [Na Ferric Gluc Cplx In Sucrose] Shortness Of Breath, Swelling and Other (See Comments)    Swelling in throat  . Minoxidil Shortness Of Breath  . DaDe KalbPropoxyphene N-Acetaminophen] Hives    Consultations: [facility]rology - [facility]   Procedures/Studies: Dg Chest 2 View  Result Date: 06/09/2016 CLINICAL DATA:  Acute onset of  generalized chest pain and shortness of breath. Initial encounter. EXAM: CHEST  2 VIEW COMPARISON:  Chest radiograph performed 06/07/2016 FINDINGS: The lungs are well-aerated and clear. There is no evidence of focal opacification, pleural effusion or pneumothorax. The heart is mildly enlarged. No acute osseous abnormalities are seen. A right-sided dual-lumen catheter is noted ending about the distal SVC. IMPRESSION: Mild cardiomegaly.  Lungs remain grossly clear. Electronically Signed   By: Garald Balding M.D.   On: 06/09/2016 23:13   Dg Chest 2 View  Result Date: 06/07/2016 CLINICAL DATA:  Shortness of breath and chest pain EXAM: CHEST  2 VIEW COMPARISON:  May 17, 2016 FINDINGS: There is no edema or consolidation. Heart is borderline prominent with pulmonary vascularity within normal limits. There is atherosclerotic calcification aorta. No adenopathy. No bone lesions. No pneumothorax. Central catheter tip is in the superior vena cava near the cavoatrial junction. IMPRESSION: Central catheter tip in superior vena cava near cavoatrial junction. No pneumothorax. No edema or consolidation. Heart borderline prominent. Electronically Signed   By: Lowella Grip III M.D.   On: 06/07/2016 21:30   Mr Brain Ltd Wo Contrast  Result Date: 05/30/2016 CLINICAL DATA:  Initial evaluation for left-sided  numbness. EXAM: MRI HEAD WITHOUT CONTRAST TECHNIQUE: Multiplanar, multiecho pulse sequences of the brain and surrounding structures were obtained without intravenous contrast. COMPARISON:  Prior CT from 05/05/2014. FINDINGS: Brain: Cerebral volume within normal limits. Minimal T2/FLAIR hyperintensity within the periventricular white matter, most likely related chronic small vessel ischemic disease, felt to be within normal limits for age. No abnormal foci of restricted diffusion to suggest acute or subacute ischemia. Gray-white matter differentiation maintained. No evidence for acute or chronic intracranial hemorrhage. No encephalomalacia to suggest chronic infarction. No mass lesion, midline shift, or mass effect. No hydrocephalus. No extra-axial fluid collection. Major dural sinuses are grossly patent. Pituitary gland and suprasellar region within normal limits. Vascular: Major intracranial vascular flow voids are maintained. Skull and upper cervical spine: Craniocervical junction normal. Visualized upper cervical spine unremarkable without significant degenerative changes are stenosis. Bone marrow signal intensity within normal limits. No scalp soft tissue abnormality. Sinuses/Orbits: Globes and orbital soft tissues within normal limits. Moderate mucosal thickening within the maxillary sinuses and ethmoidal air cells. Paranasal sinuses are otherwise clear. No mastoid effusion. Inner ear structures grossly normal. IMPRESSION: 1. Normal brain MRI. No acute intracranial infarct or other process identified. 2. Moderate maxillary and ethmoidal sinus disease, likely allergic/inflammatory in nature. Electronically Signed   By: Jeannine Boga M.D.   On: 05/30/2016 22:03      Discharge Exam: Vitals:   06/24/16 1130 06/24/16 1137  BP: (!) 142/102 (!) 135/99  Pulse: 65   Resp: 18   Temp: 98.7 F (37.1 C)    Vitals:   06/24/16 1100 06/24/16 1107 06/24/16 1130 06/24/16 1137  BP: (!) 147/90 (!) 156/95 (!)  142/102 (!) 135/99  Pulse: 72 72 65   Resp:  18 18   Temp:  98.1 F (36.7 C) 98.7 F (37.1 C)   TempSrc:  Oral Oral   SpO2:  100% 100%   Weight:        General: Pt is alert, awake, not in acute distress Cardiovascular: RRR, S1/S2 +, no rubs, no gallops Respiratory: CTA bilaterally, no wheezing, no rhonchi Abdominal: Soft, NT, ND, bowel sounds + Extremities: no edema, no cyanosis, Left AVF with good thrill     The results of significant diagnostics from this hospitalization (including imaging, microbiology, ancillary and laboratory) are listed below  for reference.     Microbiology: Recent Results (from the past 240 hour(s))  MRSA PCR Screening     Status: None   Collection Time: 06/17/16 12:29 PM  Result Value Ref Range Status   MRSA by PCR NEGATIVE NEGATIVE Final    Comment:        The GeneXpert MRSA Assay (FDA approved for NASAL specimens only), is one component of a comprehensive MRSA colonization surveillance program. It is not intended to diagnose MRSA infection nor to guide or monitor treatment for MRSA infections.      Labs: BNP (last 3 results)  Recent Labs  07/17/15 2135 09/24/15 0324  BNP >4,500.0* >9,169.4*   Basic Metabolic Panel:  Recent Labs Lab 06/18/16 1843 06/19/16 0726 06/20/16 0736 06/22/16 1052 06/23/16 0521 06/23/16 1257 06/24/16 1004  NA 134* 135 136 136 136 135 139  K 4.5 5.3* 5.4* 6.2* 6.5* >7.5* 3.6  CL 96* 96* 99* 104 98* 100* 98*  CO2 _0 --  22 22  --   GLUCOSE 76 169* 131* 106* 65 90 104*  BUN 14 20 31* 61* 72* 74* 28*  CREATININE 6.27* 8.20* 10.73* 16.00* 16.59* 16.78* 8.20*  CALCIUM 8.7* 8.8* 8.8*  --  7.6* 7.8*  --   PHOS  --   --   --   --  6.2* 5.9*  --    Liver Function Tests:  Recent Labs Lab 06/23/16 1257  ALBUMIN 2.7*   No results for input(s): LIPASE, AMYLASE in the last 168 hours. No results for input(s): AMMONIA in the last 168 hours. CBC:  Recent Labs Lab 06/18/16 1843 06/19/16 0726  06/20/16 0736 06/22/16 1052 06/23/16 1255 06/24/16 0557 06/24/16 1004  WBC 3.5* 3.6* 3.6*  --  4.8 4.5  --   NEUTROABS  --  2.0  --   --   --  2.8  --   HGB 11.2* 11.2* 11.1* 11.2* 10.0* 9.9* 10.9*  HCT 35.2* 35.0* 35.3* 33.0* 30.9* 31.0* 32.0*  MCV 90.3 90.4 90.7  --  87.3 87.3  --   PLT 172 211 197  --  263 260  --    Cardiac Enzymes:  Recent Labs Lab 06/19/16 0726  TROPONINI 0.03*   BNP: Invalid input(s): POCBNP CBG:  Recent Labs Lab 06/23/16 0754 06/24/16 0743  GLUCAP 98 134*   D-Dimer No results for input(s): DDIMER in the last 72 hours. Hgb A1c No results for input(s): HGBA1C in the last 72 hours. Lipid Profile No results for input(s): CHOL, HDL, LDLCALC, TRIG, CHOLHDL, LDLDIRECT in the last 72 hours. Thyroid function studies No results for input(s): TSH, T4TOTAL, T3FREE, THYROIDAB in the last 72 hours.  Invalid input(s): FREET3 Anemia work up No results for input(s): VITAMINB12, FOLATE, FERRITIN, TIBC, IRON, RETICCTPCT in the last 72 hours. Urinalysis    Component Value Date/Time   COLORURINE YELLOW 10/18/2013 [place] 10/18/2013 0419   LABSPEC 1.008 10/18/2013 0419   PHURINE 8.5 (H) 10/18/2013 0419   GLUCOSEU 100 (A) 10/18/2013 0419   HGBUR TRACE (A) 10/18/2013 0419   BILIRUBINUR NEGATIVE 10/18/2013 0419   KETONESUR NEGATIVE 10/18/2013 0419   PROTEINUR 100 (A) 10/18/2013 0419   UROBILINOGEN 0.2 10/18/2013 0419   NITRITE NEGATIVE 10/18/2013 0419   LEUKOCYTESUR NEGATIVE 10/18/2013 0419   Sepsis Labs Invalid input(s): PROCALCITONIN,  WBC,  LACTICIDVEN Microbiology Recent Results (from the past 240 hour(s))  MRSA PCR Screening     Status: None   Collection Time: 06/17/16 12:29  PM  Result Value Ref Range Status   MRSA by PCR NEGATIVE NEGATIVE Final    Comment:        The GeneXpert MRSA Assay (FDA approved for NASAL specimens only), is one component of a comprehensive MRSA colonization surveillance program. It is not intended  to diagnose MRSA infection nor to guide or monitor treatment for MRSA infections.     SIGNED:  Chipper Oman, MD  [facility] 06/24/2016, 12:27 PM Pager   If 7PM-7AM, please contact night-coverage [email or web address] Password [email or web address]

## 2016-06-25 LAB — POTASSIUM
Potassium: 4.1 mmol/L (ref 3.5–5.1)
Potassium: 4.1 mmol/L (ref 3.5–5.1)
Potassium: 5.1 mmol/L (ref 3.5–5.1)

## 2016-06-25 MED ORDER — OXYCODONE HCL 5 MG PO TABS
ORAL_TABLET | ORAL | Status: AC
  Administered 2016-06-25: 5 mg via ORAL
  Filled 2016-06-25: qty 1

## 2016-06-25 MED ORDER — INFLUENZA VAC SPLIT QUAD 0.5 ML IM SUSY: 0.5000 mL | PREFILLED_SYRINGE | INTRAMUSCULAR | Status: DC

## 2016-06-25 MED ORDER — PNEUMOCOCCAL VAC POLYVALENT 25 MCG/0.5ML IJ INJ
0.5000 mL | INJECTION | INTRAMUSCULAR | Status: DC
  Filled 2016-06-25: qty 0.5

## 2016-06-25 MED ORDER — FAMOTIDINE 20 MG PO TABS
20.0000 mg | ORAL_TABLET | Freq: Every day | ORAL | Status: DC
  Administered 2016-06-25: 20 mg via ORAL
  Filled 2016-06-25: qty 1

## 2016-06-25 NOTE — Progress Notes (Signed)
PROGRESS NOTE [facility]   CREIG LANDIN   HEN:[id number] DOB: [birth year]  DOA: 06/24/2016 PCP: Angelica Chessman, MD   Brief Narrative:  Frank [name] is a 50 y.o. male with medical history significant for end-stage renal disease on hemodialysis, history of DVT and PE, nonischemic cardiomyopathy, hypertension, GERD, and nonadherence with his treatment plan who presents to the emergency department with fatigue and palpitations that began just prior to arrival. Patient has recently been dependent on the hospital for his hemodialysis and was dialyzed earlier today and yesterday in the hospital. Just prior to his inpatient dialysis session on 06/23/2016, the patient had developed severe hyperkalemia with wide complex bradyarrhythmia. He was discharged this afternoon in much improved and stable condition, but developed the aforementioned symptoms, and now returned to ED for evaluation. In the ED found to be bradycardic to the low 40's with 1st degree AV block and K level of 7.4. Potassium level was 3.6 on 12./3 discharge. Patient was emergently dialyzed.   Subjective: Patient seen and examined with nurse and nephrologist at bedside. Patient have no complaints, he does not understand what happened. He denies,eating high potasium food. No trauma.   Assessment & Plan: Hyperkalemia - unclear such big increase in matter of few hours. It 3.6 was an error?, HD cath recirculating and not working correctly  Plan for HD tomorrow  Consider Kayexalate - patient likely to be non compliant  Will observe overnight and d/c in am after HD   ESRD - HD - no OP unit secondary to behavior and not having mature AVF  HD per nephrologist  They will try AVF for better cleaning   Hx of DVT, PE - likely related to HD catheter  Continue home medications  Patient noncompliant with therapy   HTN - stable  Continue home meds  Anemia chronic diseases 2/2 to CKD  Stable  Epo and Fe per nephrology   DVT  prophylaxis: Heparin Code Status: Full Family Communication: None at bedside  Disposition Plan: Anticipate discharge tomorrow after HD   Consultants:   Nephrology   Procedures:   HD 12/3 x 2  Antimicrobials:  None   Objective: Vitals:   06/25/16 0230 06/25/16 0300 06/25/16 0324 06/25/16 0424  BP: 126/83 131/90 (!) 129/99 (!) 143/92  Pulse: 67 68 71 66  Resp:   12   Temp:   98 F (36.7 C) 97.6 F (36.4 C)  TempSrc:   Oral Oral  SpO2:   96% 97%  Weight:    75.5 kg (166 lb 8 oz)  Height:    _0  (1.88 m)    Intake/Output Summary (Last 24 hours) at 06/25/16 0740 Last data filed at 06/25/16 0500  Gross per 24 hour  Intake              360 ml  Output                0 ml  Net              360 ml   Filed Weights   06/25/16 0424  Weight: 75.5 kg (166 lb 8 oz)    Examination:  General exam: Appears calm and comfortable  HEENT: AC/AT, PERRLA, OP moist and clear Respiratory system: Clear to auscultation. No wheezes,crackle or rhonchi Cardiovascular system: S1 & S2 heard, RRR. No JVD, murmurs, rubs or gallops Gastrointestinal system: Abdomen is nondistended, soft and nontender. No organomegaly or masses felt. Normal bowel sounds heard. Central nervous system: Alert and  oriented. No focal neurological deficits. Extremities: No pedal edema. Symmetric, strength 5/5   Skin: No rashes, lesions or ulcers Psychiatry: Judgement and insight appear normal. Mood & affect appropriate.    Data Reviewed: I have personally reviewed following labs and imaging studies  CBC:  Recent Labs Lab 06/19/16 0726 06/20/16 0736 06/22/16 1052 06/23/16 1255 06/24/16 0557 06/24/16 1004 06/24/16 2009  WBC 3.6* 3.6*  --  4.8 4.5  --  4.2  NEUTROABS 2.0  --   --   --  2.8  --  3.4  HGB 11.2* 11.1* 11.2* 10.0* 9.9* 10.9* 10.0*  HCT 35.0* 35.3* 33.0* 30.9* 31.0* 32.0* 31.7*  MCV 90.4 90.7  --  87.3 87.3  --  88.8  PLT 211 197  --  263 260  --  097   Basic Metabolic Panel:  Recent  Labs Lab 06/19/16 0726 06/20/16 0736 06/22/16 1052 06/23/16 0521 06/23/16 1257 06/24/16 1004 06/24/16 2009 06/24/16 2305 06/25/16 0148 06/25/16 0645  NA 135 136 136 136 135 139 133*  --   --   --   K 5.3* 5.4* 6.2* 6.5* >7.5* 3.6 7.4* 5.1 4.1 4.1  CL 96* 99* 104 98* 100* 98* 97*  --   --   --   CO2 26 24  --  22 22  --  24  --   --   --   GLUCOSE 169* 131* 106* 65 90 104* 134*  --   --   --   BUN 20 31* 61* 72* 74* 28* 33*  --   --   --   CREATININE 8.20* 10.73* 16.00* 16.59* 16.78* 8.20* 10.25*  --   --   --   CALCIUM 8.8* 8.8*  --  7.6* 7.8*  --  8.1*  --   --   --   PHOS  --   --   --  6.2* 5.9*  --   --   --   --   --    GFR: Estimated Creatinine Clearance: 9 mL/min (by C-G formula based on SCr of 10.25 mg/dL (H)). Liver Function Tests:  Recent Labs Lab 06/23/16 1257 06/24/16 2014  AST  --  23  ALT  --  12*  ALKPHOS  --  145*  BILITOT  --  0.3  PROT  --  7.2  ALBUMIN 2.7* 3.4*   No results for input(s): LIPASE, AMYLASE in the last 168 hours. No results for input(s): AMMONIA in the last 168 hours. Coagulation Profile:  Recent Labs Lab 06/19/16 0726 06/23/16 0521 06/24/16 0557 06/24/16 2009  INR 1.20 1.15 1.17 1.22   Cardiac Enzymes:  Recent Labs Lab 06/19/16 0726  TROPONINI 0.03*   BNP (last 3 results) No results for input(s): PROBNP in the last 8760 hours. HbA1C: No results for input(s): HGBA1C in the last 72 hours. CBG:  Recent Labs Lab 06/23/16 0754 06/24/16 0743  GLUCAP 98 134*   Lipid Profile: No results for input(s): CHOL, HDL, LDLCALC, TRIG, CHOLHDL, LDLDIRECT in the last 72 hours. Thyroid Function Tests: No results for input(s): TSH, T4TOTAL, FREET4, T3FREE, THYROIDAB in the last 72 hours. Anemia Panel: No results for input(s): VITAMINB12, FOLATE, FERRITIN, TIBC, IRON, RETICCTPCT in the last 72 hours. Sepsis Labs: No results for input(s): PROCALCITON, LATICACIDVEN in the last 168 hours.  Recent Results (from the past 240 hour(s))   MRSA PCR Screening     Status: None   Collection Time: 06/17/16 12:29 PM  Result Value Ref Range Status  MRSA by PCR NEGATIVE NEGATIVE Final    Comment:        The GeneXpert MRSA Assay (FDA approved for NASAL specimens only), is one component of a comprehensive MRSA colonization surveillance program. It is not intended to diagnose MRSA infection nor to guide or monitor treatment for MRSA infections.      Radiology Studies: No results found.    Scheduled Meds: . allopurinol  100 mg Oral Daily  . amLODipine  10 mg Oral BID  . atorvastatin  40 mg Oral q1800  . cinacalcet  30 mg Oral Q supper  . famotidine  20 mg Oral BID  . gabapentin  100 mg Oral TID  . heparin  5,000 Units Subcutaneous Q8H  . Influenza vac split quadrivalent PF  0.5 mL Intramuscular Tomorrow-1000  . isosorbide mononitrate  60 mg Oral Daily  . pantoprazole  40 mg Oral Daily  . pneumococcal 23 valent vaccine  0.5 mL Intramuscular Tomorrow-1000  . predniSONE  10 mg Oral Q breakfast  . sevelamer carbonate  1,600 mg Oral TID WC  . sodium chloride flush  3 mL Intravenous Q12H  . sodium chloride flush  3 mL Intravenous Q12H  . sodium polystyrene  60 g Oral Once   Continuous Infusions:   LOS: 1 day    Chipper Oman, MD [facility] Pager [phone]  If 7PM-7AM, please contact night-coverage [email or web address] Password [email or web address] 06/25/2016, 7:40 AM

## 2016-06-25 NOTE — Progress Notes (Signed)
Pt left floor to go to the [place] to read, and to go to the cafe for lunch. He stated that the MD told him to act normal. So he says he will walk around the hospital. MD notified, that RN will need an order for the patient to be off of the floor. Also pt will possibly need to be non tele.

## 2016-06-25 NOTE — Progress Notes (Signed)
Subjective:  Unclear what happened to take his K from 3.6 to 7.5 in a matter of hours (was it ever 3.6?).  No other sxms- finished HD at 0200- K at 6:45 was 4.1 ? Objective Vital signs in last 24 hours: Vitals:   06/25/16 0230 06/25/16 0300 06/25/16 0324 06/25/16 0424  BP: 126/83 131/90 (!) 129/99 (!) 143/92  Pulse: 67 68 71 66  Resp:   12   Temp:   98 F (36.7 C) 97.6 F (36.4 C)  TempSrc:   Oral Oral  SpO2:   96% 97%  Weight:    75.5 kg (166 lb 8 oz)  Height:    _0  (1.88 m)   Weight change:   Intake/Output Summary (Last 24 hours) at 06/25/16 0355 Last data filed at 06/25/16 0849  Gross per 24 hour  Intake              582 ml  Output                0 ml  Net              582 ml    Assessment/ Plan: Pt is a 50 y.o. yo male who was admitted on 06/24/2016 with hyperkalemia in ESRD pt  Assessment/Plan: 1. Hyperkalemia- was either the 3.6 was error, something surreptitious , or something organic (acidosis, muscle necrosis although no sxms).  Agree with watching today and HD in early AM- using AVG which should give good cleaning and good potassium clearance.  Consider [place] although not sure he would take  2. ESRD- no OP unit secondary to behaviors.  [facility] be [facility] once PC is out  3. Anemia- stable- no meds for now 4. Secondary hyperparathyroidism- sensipar , renvela 5. HTN/volume- seems OK for now as this is the most dialysis he has had in a while  Henritta Mutz A    Labs: Basic Metabolic Panel:  Recent Labs Lab 06/23/16 0521 06/23/16 1257 06/24/16 1004 06/24/16 2009 06/24/16 2305 06/25/16 0148 06/25/16 0645  NA 136 135 139 133*  --   --   --   K 6.5* >7.5* 3.6 7.4* 5.1 4.1 4.1  CL 98* 100* 98* 97*  --   --   --   CO2 22 22  --  24  --   --   --   GLUCOSE 65 90 104* 134*  --   --   --   BUN 72* 74* 28* 33*  --   --   --   CREATININE 16.59* 16.78* 8.20* 10.25*  --   --   --   CALCIUM 7.6* 7.8*  --  8.1*  --   --   --   PHOS 6.2* 5.9*  --   --    --   --   --    Liver Function Tests:  Recent Labs Lab 06/23/16 1257 06/24/16 2014  AST  --  23  ALT  --  12*  ALKPHOS  --  145*  BILITOT  --  0.3  PROT  --  7.2  ALBUMIN 2.7* 3.4*   No results for input(s): LIPASE, AMYLASE in the last 168 hours. No results for input(s): AMMONIA in the last 168 hours. CBC:  Recent Labs Lab 06/19/16 0726 06/20/16 0736  06/23/16 1255 06/24/16 0557 06/24/16 1004 06/24/16 2009  WBC 3.6* 3.6*  --  4.8 4.5  --  4.2  NEUTROABS 2.0  --   --   --  2.8  --  3.4  HGB 11.2* 11.1*  < > 10.0* 9.9* 10.9* 10.0*  HCT 35.0* 35.3*  < > 30.9* 31.0* 32.0* 31.7*  MCV 90.4 90.7  --  87.3 87.3  --  88.8  PLT 211 197  --  263 260  --  197  < > = values in this interval not displayed. Cardiac Enzymes:  Recent Labs Lab 06/19/16 0726  TROPONINI 0.03*   CBG:  Recent Labs Lab 06/23/16 0754 06/24/16 0743  GLUCAP 98 134*    Iron Studies: No results for input(s): IRON, TIBC, TRANSFERRIN, FERRITIN in the last 72 hours. Studies/Results: No results found. Medications: Infusions:   Scheduled Medications: . allopurinol  100 mg Oral Daily  . amLODipine  10 mg Oral BID  . atorvastatin  40 mg Oral q1800  . cinacalcet  30 mg Oral Q supper  . famotidine  20 mg Oral BID  . gabapentin  100 mg Oral TID  . heparin  5,000 Units Subcutaneous Q8H  . Influenza vac split quadrivalent PF  0.5 mL Intramuscular Tomorrow-1000  . isosorbide mononitrate  60 mg Oral Daily  . pantoprazole  40 mg Oral Daily  . pneumococcal 23 valent vaccine  0.5 mL Intramuscular Tomorrow-1000  . predniSONE  10 mg Oral Q breakfast  . sevelamer carbonate  1,600 mg Oral TID WC  . sodium chloride flush  3 mL Intravenous Q12H  . sodium chloride flush  3 mL Intravenous Q12H  . sodium polystyrene  60 g Oral Once    have reviewed scheduled and prn medications.  Physical Exam: General: NAD Heart: RRR Lungs: clear Abdomen: soft, non tender Extremities: no edema Dialysis Access: left AVG  and PC    06/25/2016,9:05 AM  LOS: 1 day

## 2016-06-26 DIAGNOSIS — E875 Hyperkalemia: Principal | ICD-10-CM

## 2016-06-26 LAB — RENAL FUNCTION PANEL
ALBUMIN: 2.9 g/dL — AB (ref 3.5–5.0)
ANION GAP: 9 (ref 5–15)
BUN: 35 mg/dL — ABNORMAL HIGH (ref 6–20)
CALCIUM: 8 mg/dL — AB (ref 8.9–10.3)
CO2: 29 mmol/L (ref 22–32)
CREATININE: 9.69 mg/dL — AB (ref 0.61–1.24)
Chloride: 96 mmol/L — ABNORMAL LOW (ref 101–111)
GFR calc non Af Amer: 5 mL/min — ABNORMAL LOW (ref >60)
GFR, EST AFRICAN AMERICAN: 6 mL/min — AB (ref >60)
Glucose, Bld: 120 mg/dL — ABNORMAL HIGH (ref 65–99)
PHOSPHORUS: 3.6 mg/dL (ref 2.5–4.6)
Potassium: 4.4 mmol/L (ref 3.5–5.1)
SODIUM: 134 mmol/L — AB (ref 135–145)

## 2016-06-26 LAB — GLUCOSE, CAPILLARY: Glucose-Capillary: 128 mg/dL — ABNORMAL HIGH (ref 65–99)

## 2016-06-26 NOTE — Progress Notes (Signed)
Patient refused  weight this morning.

## 2016-06-26 NOTE — Progress Notes (Signed)
Subjective:  Potassium fine since dialysis last ended 12/5 at 0200- on today pre HD K was 4.4- using AVG!!   Objective Vital signs in last 24 hours: Vitals:   06/26/16 0727 06/26/16 0800 06/26/16 0830 06/26/16 0900  BP: (!) 170/84 (!) 170/95 (!) 149/84 (!) 142/74  Pulse: 67 69 60 64  Resp: 20 (!) _0 Temp:      TempSrc:      SpO2:      Weight:      Height:       Weight change:   Intake/Output Summary (Last 24 hours) at 06/26/16 0920 Last data filed at 06/26/16 0500  Gross per 24 hour  Intake              716 ml  Output                0 ml  Net              716 ml    Assessment/ Plan: Pt is a 50 y.o. yo male who was admitted on 06/24/2016 with hyperkalemia in ESRD pt  Assessment/Plan: 1. Hyperkalemia- was either the 3.6 was error, something surreptitious , or something organic (acidosis, muscle necrosis although no sxms).  K stable last 24 hours  HD now- using AVG which should give good cleaning and good potassium clearance.  Plan is for discharge home after HD- keep PC in- will try to use AVG Friday and MOnday and POSSIBLY to [facility] late next week ?? 2. ESRD- no OP unit secondary to behaviors.  [facility] be accepted to [facility] once PC is out  3. Anemia- stable- no meds for now 4. Secondary hyperparathyroidism- sensipar , renvela- phos great.  PTH 575 5. HTN/volume- seems OK for now as this is the most dialysis he has had in a while  Frank [name] A    Labs: Basic Metabolic Panel:  Recent Labs Lab 06/23/16 0521 06/23/16 1257 06/24/16 1004 06/24/16 2009  06/25/16 0645 06/25/16 2159 06/26/16 0738  NA 136 135 139 133*  --   --   --  134*  K 6.5* >7.5* 3.6 7.4*  < > 4.1 5.1 4.4  CL 98* 100* 98* 97*  --   --   --  96*  CO2 22 22  --  24  --   --   --  29  GLUCOSE 65 90 104* 134*  --   --   --  120*  BUN 72* 74* 28* 33*  --   --   --  35*  CREATININE 16.59* 16.78* 8.20* 10.25*  --   --   --  9.69*  CALCIUM 7.6* 7.8*  --  8.1*  --   --   --  8.0*  PHOS 6.2* 5.9*  --    --   --   --   --  3.6  < > = values in this interval not displayed. Liver Function Tests:  Recent Labs Lab 06/23/16 1257 06/24/16 2014 06/26/16 0738  AST  --  23  --   ALT  --  12*  --   ALKPHOS  --  145*  --   BILITOT  --  0.3  --   PROT  --  7.2  --   ALBUMIN 2.7* 3.4* 2.9*   No results for input(s): LIPASE, AMYLASE in the last 168 hours. No results for input(s): AMMONIA in the last 168 hours. CBC:  Recent Labs Lab 06/20/16 0736  06/23/16  1255 06/24/16 0557 06/24/16 1004 06/24/16 2009  WBC 3.6*  --  4.8 4.5  --  4.2  NEUTROABS  --   --   --  2.8  --  3.4  HGB 11.1*  < > 10.0* 9.9* 10.9* 10.0*  HCT 35.3*  < > 30.9* 31.0* 32.0* 31.7*  MCV 90.7  --  87.3 87.3  --  88.8  PLT 197  --  263 260  --  197  < > = values in this interval not displayed. Cardiac Enzymes: No results for input(s): CKTOTAL, CKMB, CKMBINDEX, TROPONINI in the last 168 hours. CBG:  Recent Labs Lab 06/23/16 0754 06/24/16 0743 06/26/16 0630  GLUCAP 98 134* 128*    Iron Studies: No results for input(s): IRON, TIBC, TRANSFERRIN, FERRITIN in the last 72 hours. Studies/Results: No results found. Medications: Infusions:   Scheduled Medications: . allopurinol  100 mg Oral Daily  . amLODipine  10 mg Oral BID  . atorvastatin  40 mg Oral q1800  . cinacalcet  30 mg Oral Q supper  . famotidine  20 mg Oral QHS  . gabapentin  100 mg Oral TID  . heparin  5,000 Units Subcutaneous Q8H  . Influenza vac split quadrivalent PF  0.5 mL Intramuscular Tomorrow-1000  . isosorbide mononitrate  60 mg Oral Daily  . pantoprazole  40 mg Oral Daily  . pneumococcal 23 valent vaccine  0.5 mL Intramuscular Tomorrow-1000  . predniSONE  10 mg Oral Q breakfast  . sevelamer carbonate  1,600 mg Oral TID WC  . sodium chloride flush  3 mL Intravenous Q12H  . sodium chloride flush  3 mL Intravenous Q12H  . sodium polystyrene  60 g Oral Once    have reviewed scheduled and prn medications.  Physical Exam: General:  NAD Heart: RRR Lungs: clear Abdomen: soft, non tender Extremities: no edema Dialysis Access: left AVG and PC    06/26/2016,9:20 AM  LOS: 2 days

## 2016-06-26 NOTE — Discharge Summary (Signed)
Physician Discharge Summary  Frank [name] [id number] DOB: Sep 23, 1965 DOA: 06/24/2016  PCP: Angelica Chessman, MD  Admit date: 06/24/2016 Discharge date: 06/26/2016  Admitted From: Home Disposition:  Home  Recommendations for Outpatient Follow-up:  1. Follow up with PCP in 1-2 weeks 2. HD MWF as previous, has been accepted by [facility] when AVF mature  3. Please obtain BMP/CBC in one week as well as INR checks. Patient has not been compliant with coumadin.   Home Health: No  Equipment/Devices: None   Discharge Condition: Stable CODE STATUS: Full  Diet recommendation: Renal diet   Brief/Interim Summary: From H&P: Frank [name] is a 50 y.o. male with medical history significant for end-stage renal disease on hemodialysis (comes to the hospital for HD), history of DVT and PE (on coumadin but noncompliant), nonischemic cardiomyopathy, hypertension, GERD, and nonadherence with his treatment plan who presents to the emergency department with fatigue and palpitations that began just prior to arrival. Patient has recently been dependent on the hospital for his hemodialysis. Just prior to his inpatient dialysis session on 06/23/2016, the patient had developed severe hyperkalemia with wide complex bradyarrhythmia. He was discharged afternoon of 12/4 in much improved and stable condition, but developed the aforementioned symptoms, and now returns for evaluation. Potassium level was 3.6 morning prior to discharge. Patient denies eating or drinking anything from the time of hospital discharge up until his presentation tonight. He denies recent fevers or chills and denies cough or dyspnea. In the ED found to be bradycardic to the low 40's with 1st degree AV block and K level of 7.4. Patient was emergently dialyzed and dialyzed again on 12/6 with improvement in lab.   Subjective on day of discharge: Feeling well today after dialysis. No complaints of Cp, SOB, N/V. Tolerating meal and ambulating well.  Ready for discharge.   Discharge Diagnoses:  Principal Problem:   Hyperkalemia Active Problems:   Anemia of chronic kidney failure   Nonischemic cardiomyopathy (HCC)   Bradycardia   Personal history of DVT (deep vein thrombosis)/ PE   GERD (gastroesophageal reflux disease)   ESRD (end stage renal disease) (Frank [name])   Dialysis patient, noncompliant ([place])   Anemia associated with chronic renal failure   End-stage renal disease on hemodialysis (Frank [name])   Subtherapeutic international normalized ratio (INR)  Hyperkalemia - unclear such big increase in matter of few hours. It 3.6 was an error? Resolved with HD   ESRD - HD - no OP unit secondary to behavior and not having mature AVF. HD per nephrologist. Accepted at [facility] when AVF mature.   Hx of DVT, PE - likely related to HD catheter  Patient noncompliant with therapy, INR subtherapeutic. If HD cath removed, could consider coumadin withdrawal   HTN - stable  Continue home meds  Anemia chronic diseases 2/2 to CKD  Stable  Epo and Fe per nephrology   Discharge Instructions  Discharge Instructions    Increase activity slowly    Complete by:  As directed        Medication List    TAKE these medications   allopurinol 100 MG tablet Commonly known as:  ZYLOPRIM Take 100 mg by mouth daily.   amLODipine 10 MG tablet Commonly known as:  NORVASC Take 10 mg by mouth 2 (two) times daily.   atorvastatin 40 MG tablet Commonly known as:  LIPITOR Take 1 tablet (40 mg total) by mouth daily at 6 PM.   carvedilol 25 MG tablet Commonly known as:  COREG Take  25 mg by mouth 2 (two) times daily with a meal.   cinacalcet 30 MG tablet Commonly known as:  SENSIPAR Take 30 mg by mouth every evening.   cloNIDine 0.3 mg/24hr patch Commonly known as:  CATAPRES - Dosed in mg/24 hr Place 0.3 mg onto the skin once a week.   doxazosin 2 MG tablet Commonly known as:  CARDURA Take 2 mg by mouth daily.   famotidine 20 MG  tablet Commonly known as:  PEPCID Take 1 tablet (20 mg total) by mouth 2 (two) times daily.   gabapentin 100 MG capsule Commonly known as:  NEURONTIN Take 1 capsule (100 mg total) by mouth at bedtime. What changed:  when to take this   hydrALAZINE 100 MG tablet Commonly known as:  APRESOLINE Take 100 mg by mouth 2 (two) times daily.   isosorbide mononitrate 60 MG 24 hr tablet Commonly known as:  IMDUR Take 1 tablet (60 mg total) by mouth daily.   omeprazole 20 MG capsule Commonly known as:  PRILOSEC Take 20 mg by mouth daily.   ondansetron 4 MG disintegrating tablet Commonly known as:  ZOFRAN ODT Take 1 tablet (4 mg total) by mouth every 8 (eight) hours as needed for nausea or vomiting.   oxycodone 5 MG capsule Commonly known as:  OXY-IR Take 1 capsule (5 mg total) by mouth every 4 (four) hours as needed for pain.   oxyCODONE-acetaminophen 5-325 MG tablet Commonly known as:  PERCOCET/ROXICET Take 1 tablet by mouth every 6 (six) hours as needed for severe pain.   predniSONE 10 MG tablet Commonly known as:  DELTASONE Take 10 mg by mouth daily with breakfast.   sevelamer carbonate 800 MG tablet Commonly known as:  RENVELA Take 1,600 mg by mouth 3 (three) times daily with meals.   warfarin 2 MG tablet Commonly known as:  COUMADIN Take 2 mg by mouth daily. Take with 65m tablet to equal 759m  warfarin 5 MG tablet Commonly known as:  COUMADIN Take 5 mg by mouth See admin instructions. Take 1 tablet (5 mg) by mouth with a 2 mg tablet for a 7 mg dose      Follow-up Information    JEGEDE, OLUGBEMIGA, MD. Schedule an appointment as soon as possible for a visit in 1 week(s).   Specialty:  Internal Medicine Contact information: [phone]        Allergies  Allergen Reactions  . Butalbital-Apap-Caffeine Shortness Of Breath, Swelling and Other (See Comments)    Swelling in throat  . Ferrlecit [Na Ferric Gluc Cplx In  Sucrose] Shortness Of Breath, Swelling and Other (See Comments)    Swelling in throat  . Minoxidil Shortness Of Breath  . Tylenol [Acetaminophen] Anaphylaxis and Swelling  . Darvocet [Propoxyphene N-Acetaminophen] Hives    Consultations:  Nephrology    Procedures/Studies: Dg Chest 2 View  Result Date: 06/09/2016 CLINICAL DATA:  Acute onset of generalized chest pain and shortness of breath. Initial encounter. EXAM: CHEST  2 VIEW COMPARISON:  Chest radiograph performed 06/07/2016 FINDINGS: The lungs are well-aerated and clear. There is no evidence of focal opacification, pleural effusion or pneumothorax. The heart is mildly enlarged. No acute osseous abnormalities are seen. A right-sided dual-lumen catheter is noted ending about the distal SVC. IMPRESSION: Mild cardiomegaly.  Lungs remain grossly clear. Electronically Signed   By: JeGarald Balding.D.   On: 06/09/2016 23:13   Dg Chest 2 View  Result Date: 06/07/2016 CLINICAL  DATA:  Shortness of breath and chest pain EXAM: CHEST  2 VIEW COMPARISON:  May 17, 2016 FINDINGS: There is no edema or consolidation. Heart is borderline prominent with pulmonary vascularity within normal limits. There is atherosclerotic calcification aorta. No adenopathy. No bone lesions. No pneumothorax. Central catheter tip is in the superior vena cava near the cavoatrial junction. IMPRESSION: Central catheter tip in superior vena cava near cavoatrial junction. No pneumothorax. No edema or consolidation. Heart borderline prominent. Electronically Signed   By: Lowella Grip III M.D.   On: 06/07/2016 21:30   Mr Brain Ltd Wo Contrast  Result Date: 05/30/2016 CLINICAL DATA:  Initial evaluation for left-sided numbness. EXAM: MRI HEAD WITHOUT CONTRAST TECHNIQUE: Multiplanar, multiecho pulse sequences of the brain and surrounding structures were obtained without intravenous contrast. COMPARISON:  Prior CT from 05/05/2014. FINDINGS: Brain: Cerebral volume within normal  limits. Minimal T2/FLAIR hyperintensity within the periventricular white matter, most likely related chronic small vessel ischemic disease, felt to be within normal limits for age. No abnormal foci of restricted diffusion to suggest acute or subacute ischemia. Gray-white matter differentiation maintained. No evidence for acute or chronic intracranial hemorrhage. No encephalomalacia to suggest chronic infarction. No mass lesion, midline shift, or mass effect. No hydrocephalus. No extra-axial fluid collection. Major dural sinuses are grossly patent. Pituitary gland and suprasellar region within normal limits. Vascular: Major intracranial vascular flow voids are maintained. Skull and upper cervical spine: Craniocervical junction normal. Visualized upper cervical spine unremarkable without significant degenerative changes are stenosis. Bone marrow signal intensity within normal limits. No scalp soft tissue abnormality. Sinuses/Orbits: Globes and orbital soft tissues within normal limits. Moderate mucosal thickening within the maxillary sinuses and ethmoidal air cells. Paranasal sinuses are otherwise clear. No mastoid effusion. Inner ear structures grossly normal. IMPRESSION: 1. Normal brain MRI. No acute intracranial infarct or other process identified. 2. Moderate maxillary and ethmoidal sinus disease, likely allergic/inflammatory in nature. Electronically Signed   By: Jeannine Boga M.D.   On: 05/30/2016 22:03     Discharge Exam: Vitals:   06/26/16 1143 06/26/16 1226  BP: (!) 181/110 (!) 160/105  Pulse: 60 69  Resp: (!) 21 (!) 21  Temp:  98.6 F (37 C)   Vitals:   06/26/16 1030 06/26/16 1100 06/26/16 1143 06/26/16 1226  BP: (!) 154/60 (!) 164/93 (!) 181/110 (!) 160/105  Pulse: 63 (!) 59 60 69  Resp: 18 (!) 22 (!) 21 (!) 21  Temp:  98.9 F (37.2 C)  98.6 F (37 C)  TempSrc:  Oral Oral Oral  SpO2:  99% 99% 99%  Weight:   74.1 kg (163 lb 5.8 oz)   Height:        General: Pt is alert,  awake, not in acute distress Cardiovascular: RRR, S1/S2 +, no rubs, no gallops Respiratory: CTA bilaterally, no wheezing, no rhonchi Abdominal: Soft, NT, ND, bowel sounds + Extremities: no edema, no cyanosis Skin: left forearm AVF and right chest wall tunneled catheter in place     The results of significant diagnostics from this hospitalization (including imaging, microbiology, ancillary and laboratory) are listed below for reference.     Microbiology: Recent Results (from the past 240 hour(s))  MRSA PCR Screening     Status: None   Collection Time: 06/17/16 12:29 PM  Result Value Ref Range Status   MRSA by PCR NEGATIVE NEGATIVE Final    Comment:        The GeneXpert MRSA Assay (FDA approved for NASAL specimens only), is one component of  a comprehensive MRSA colonization surveillance program. It is not intended to diagnose MRSA infection nor to guide or monitor treatment for MRSA infections.      Labs: BNP (last 3 results)  Recent Labs  07/17/15 2135 09/24/15 0324  BNP >4,500.0* >6,503.5*   Basic Metabolic Panel:  Recent Labs Lab 06/20/16 0736  06/23/16 0521 06/23/16 1257 06/24/16 1004 06/24/16 2009 06/24/16 2305 06/25/16 0148 06/25/16 0645 06/25/16 2159 06/26/16 0738  NA 136  < > 136 135 139 133*  --   --   --   --  134*  K 5.4*  < > 6.5* >7.5* 3.6 7.4* 5.1 4.1 4.1 5.1 4.4  CL 99*  < > 98* 100* 98* 97*  --   --   --   --  96*  CO2 24  --  22 22  --  24  --   --   --   --  29  GLUCOSE 131*  < > 65 90 104* 134*  --   --   --   --  120*  BUN 31*  < > 72* 74* 28* 33*  --   --   --   --  35*  CREATININE 10.73*  < > 16.59* 16.78* 8.20* 10.25*  --   --   --   --  9.69*  CALCIUM 8.8*  --  7.6* 7.8*  --  8.1*  --   --   --   --  8.0*  PHOS  --   --  6.2* 5.9*  --   --   --   --   --   --  3.6  < > = values in this interval not displayed. Liver Function Tests:  Recent Labs Lab 06/23/16 1257 06/24/16 2014 06/26/16 0738  AST  --  23  --   ALT  --  12*  --    ALKPHOS  --  145*  --   BILITOT  --  0.3  --   PROT  --  7.2  --   ALBUMIN 2.7* 3.4* 2.9*   No results for input(s): LIPASE, AMYLASE in the last 168 hours. No results for input(s): AMMONIA in the last 168 hours. CBC:  Recent Labs Lab 06/20/16 0736 06/22/16 1052 06/23/16 1255 06/24/16 0557 06/24/16 1004 06/24/16 2009  WBC 3.6*  --  4.8 4.5  --  4.2  NEUTROABS  --   --   --  2.8  --  3.4  HGB 11.1* 11.2* 10.0* 9.9* 10.9* 10.0*  HCT 35.3* 33.0* 30.9* 31.0* 32.0* 31.7*  MCV 90.7  --  87.3 87.3  --  88.8  PLT 197  --  263 260  --  197   Cardiac Enzymes: No results for input(s): CKTOTAL, CKMB, CKMBINDEX, TROPONINI in the last 168 hours. BNP: Invalid input(s): POCBNP CBG:  Recent Labs Lab 06/23/16 0754 06/24/16 0743 06/26/16 0630  GLUCAP 98 134* 128*   D-Dimer No results for input(s): DDIMER in the last 72 hours. Hgb A1c No results for input(s): HGBA1C in the last 72 hours. Lipid Profile No results for input(s): CHOL, HDL, LDLCALC, TRIG, CHOLHDL, LDLDIRECT in the last 72 hours. Thyroid function studies No results for input(s): TSH, T4TOTAL, T3FREE, THYROIDAB in the last 72 hours.  Invalid input(s): FREET3 Anemia work up No results for input(s): VITAMINB12, FOLATE, FERRITIN, TIBC, IRON, RETICCTPCT in the last 72 hours. Urinalysis    Component Value Date/Time   COLORURINE YELLOW 10/18/2013 [place]  10/18/2013 0419   LABSPEC 1.008 10/18/2013 0419   PHURINE 8.5 (H) 10/18/2013 0419   GLUCOSEU 100 (A) 10/18/2013 0419   HGBUR TRACE (A) 10/18/2013 0419   BILIRUBINUR NEGATIVE 10/18/2013 0419   KETONESUR NEGATIVE 10/18/2013 0419   PROTEINUR 100 (A) 10/18/2013 0419   UROBILINOGEN 0.2 10/18/2013 0419   NITRITE NEGATIVE 10/18/2013 0419   LEUKOCYTESUR NEGATIVE 10/18/2013 0419   Sepsis Labs Invalid input(s): PROCALCITONIN,  WBC,  LACTICIDVEN Microbiology Recent Results (from the past 240 hour(s))  MRSA PCR Screening     Status: None   Collection  Time: 06/17/16 12:29 PM  Result Value Ref Range Status   MRSA by PCR NEGATIVE NEGATIVE Final    Comment:        The GeneXpert MRSA Assay (FDA approved for NASAL specimens only), is one component of a comprehensive MRSA colonization surveillance program. It is not intended to diagnose MRSA infection nor to guide or monitor treatment for MRSA infections.      Time coordinating discharge: Over 30 minutes  SIGNED:  Dessa Phi, DO [facility] Pager [phone]  If 7PM-7AM, please contact night-coverage [email or web address] Password [email or web address] 06/26/2016, 12:55 PM

## 2016-06-26 NOTE — Procedures (Signed)
Patient was seen on dialysis and the procedure was supervised.  BFR 400  Via AVG BP is  149/80.   Patient appears to be tolerating treatment well  Delta Deshmukh A 06/26/2016

## 2016-06-28 ENCOUNTER — Non-Acute Institutional Stay (HOSPITAL_COMMUNITY)
Admission: EM | Admit: 2016-06-28 | Discharge: 2016-06-28 | Disposition: A | Discharge status: Home / Self Care | Payer: Medicare Other | Source: Home / Self Care | Attending: Emergency Medicine | Admitting: Emergency Medicine

## 2016-06-28 ENCOUNTER — Emergency Department (HOSPITAL_COMMUNITY)
Admission: EM | Admit: 2016-06-28 | Discharge: 2016-06-28 | Disposition: A | Discharge status: Home / Self Care | Payer: Medicare Other | Attending: Emergency Medicine | Admitting: Emergency Medicine

## 2016-06-28 ENCOUNTER — Encounter (HOSPITAL_COMMUNITY): Payer: Self-pay | Admitting: *Deleted

## 2016-06-28 ENCOUNTER — Encounter (HOSPITAL_COMMUNITY): Payer: Self-pay

## 2016-06-28 DIAGNOSIS — E785 Hyperlipidemia, unspecified: Secondary | ICD-10-CM

## 2016-06-28 DIAGNOSIS — E1122 Type 2 diabetes mellitus with diabetic chronic kidney disease: Secondary | ICD-10-CM | POA: Insufficient documentation

## 2016-06-28 DIAGNOSIS — Z452 Encounter for adjustment and management of vascular access device: Secondary | ICD-10-CM | POA: Diagnosis present

## 2016-06-28 DIAGNOSIS — Z79899 Other long term (current) drug therapy: Secondary | ICD-10-CM

## 2016-06-28 DIAGNOSIS — Z940 Kidney transplant status: Secondary | ICD-10-CM | POA: Insufficient documentation

## 2016-06-28 DIAGNOSIS — D631 Anemia in chronic kidney disease: Secondary | ICD-10-CM | POA: Insufficient documentation

## 2016-06-28 DIAGNOSIS — Z87891 Personal history of nicotine dependence: Secondary | ICD-10-CM | POA: Insufficient documentation

## 2016-06-28 DIAGNOSIS — Z992 Dependence on renal dialysis: Secondary | ICD-10-CM | POA: Insufficient documentation

## 2016-06-28 DIAGNOSIS — N186 End stage renal disease: Secondary | ICD-10-CM | POA: Insufficient documentation

## 2016-06-28 DIAGNOSIS — Z7901 Long term (current) use of anticoagulants: Secondary | ICD-10-CM

## 2016-06-28 DIAGNOSIS — I132 Hypertensive heart and chronic kidney disease with heart failure and with stage 5 chronic kidney disease, or end stage renal disease: Secondary | ICD-10-CM

## 2016-06-28 DIAGNOSIS — Z5321 Procedure and treatment not carried out due to patient leaving prior to being seen by health care provider: Secondary | ICD-10-CM

## 2016-06-28 DIAGNOSIS — I2510 Atherosclerotic heart disease of native coronary artery without angina pectoris: Secondary | ICD-10-CM | POA: Insufficient documentation

## 2016-06-28 DIAGNOSIS — Z7952 Long term (current) use of systemic steroids: Secondary | ICD-10-CM | POA: Insufficient documentation

## 2016-06-28 DIAGNOSIS — I120 Hypertensive chronic kidney disease with stage 5 chronic kidney disease or end stage renal disease: Secondary | ICD-10-CM | POA: Diagnosis not present

## 2016-06-28 DIAGNOSIS — Z86711 Personal history of pulmonary embolism: Secondary | ICD-10-CM | POA: Insufficient documentation

## 2016-06-28 DIAGNOSIS — I428 Other cardiomyopathies: Secondary | ICD-10-CM

## 2016-06-28 DIAGNOSIS — Z86718 Personal history of other venous thrombosis and embolism: Secondary | ICD-10-CM | POA: Insufficient documentation

## 2016-06-28 DIAGNOSIS — I5042 Chronic combined systolic (congestive) and diastolic (congestive) heart failure: Secondary | ICD-10-CM | POA: Insufficient documentation

## 2016-06-28 DIAGNOSIS — K219 Gastro-esophageal reflux disease without esophagitis: Secondary | ICD-10-CM

## 2016-06-28 LAB — I-STAT CHEM 8, ED
BUN: 41 mg/dL — ABNORMAL HIGH (ref 6–20)
Calcium, Ion: 1 mmol/L — ABNORMAL LOW (ref 1.15–1.40)
Chloride: 103 mmol/L (ref 101–111)
Creatinine, Ser: 9.9 mg/dL — ABNORMAL HIGH (ref 0.61–1.24)
Glucose, Bld: 67 mg/dL (ref 65–99)
HCT: 39 % (ref 39.0–52.0)
Hemoglobin: 13.3 g/dL (ref 13.0–17.0)
Potassium: 5.2 mmol/L — ABNORMAL HIGH (ref 3.5–5.1)
Sodium: 140 mmol/L (ref 135–145)
TCO2: 28 mmol/L (ref 0–100)

## 2016-06-28 NOTE — ED Triage Notes (Signed)
Pt presents for dialysis, last treatment was on Tuesday.

## 2016-06-28 NOTE — ED Notes (Signed)
Patient here for dialysis.

## 2016-06-28 NOTE — ED Notes (Signed)
Pt stated he was leaving to go get some breakfast. Explained to pt that it would be considered leaving AMA, since we can't hold the bed for him. He stated he does not care, we can do whatever.

## 2016-06-28 NOTE — ED Provider Notes (Signed)
[facility] DEPT Provider Note   CSN: [id number] Arrival date & time: 06/28/16  6761     History   Chief Complaint Chief Complaint  Patient presents with  . Vascular Access Problem    HPI Frank [name] is a 50 y.o. male.  50yo M w/ PMH including ESRD on HD, CHF, T2DM who p/w request for dialysis. Pt presented here this morning for routine dialysis and left AMA because he wanted breakfast. He returns for dialysis. He was discharged from here on 12/6 and states he had dialysis on 12/4-6 daily. He denies any chest pain, shortness of breath, fevers, vomiting, or other complaints.   The history is provided by the patient.    Past Medical History:  Diagnosis Date  . Anemia   . Anxiety   . Chronic combined systolic and diastolic CHF (congestive heart failure) (HCC)    a. EF 20-25% by echo in 08/2015 b. echo 10/2015: EF 35-40%, diffuse HK, severe LAE, moderate RAE, small pericardial effusion  . Complication of anesthesia    itching, sore throat  . Depression   . Dialysis patient ([place])   . ESRD (end stage renal disease) ([place])    due to HTN per patient, followed at [facility], s/p failed kidney transplant - dialysis Tue, Th, Sat  . Hyperkalemia 12/2015  . Hypertension   . Junctional rhythm    a. noted in 08/2015: hyperkalemic at that time  b. 12/2015: presented in junctional rhythm w/ K+ of 6.6. Resolved with improvement of K+ levels.  . Nonischemic cardiomyopathy ([place])    a. 08/2014: cath showing minimal CAD, but tortuous arteries noted.   . Personal history of DVT (deep vein thrombosis)/ PE 05/26/2016   In Oct 2015 had small subsemental LUL PE w/o DVT (LE dopplers neg) and was felt to be HD cath related, treated w coumadin.  IN May 2016 had small vein DVT (acute/subacute) in the R basilic/ brachial veins of the RUE, resumed on coumadin.  Had R sided HD cath at that time.    . Renal insufficiency   . Shortness of breath   . Type II diabetes mellitus (HCC)    No history per patient,  but remains under history as A1c would not be accurate given on dialysis    Patient Active Problem List   Diagnosis Date Noted  . Anemia associated with chronic renal failure   . End-stage renal disease on hemodialysis ([place])   . Subtherapeutic international normalized ratio (INR)   . Hypertension   . Dialysis patient, noncompliant ([place]) 06/22/2016  . ESRD (end stage renal disease) on dialysis ([place])   . ESRD (end stage renal disease) ([place]) 06/20/2016  . ESRD needing dialysis ([place]) 06/14/2016  . Uremia 06/07/2016  . Hyperkalemia 05/29/2016  . Chronic back pain 05/29/2016  . GERD (gastroesophageal reflux disease) 05/29/2016  . Personal history of DVT (deep vein thrombosis)/ PE 05/26/2016  . Bradycardia 03/31/2016  . Nonischemic cardiomyopathy ([place]) 01/09/2016  . Bilateral low back pain without sciatica   . Left renal mass 10/30/2015  . Constipation 10/30/2015  . Adjustment disorder with mixed anxiety and depressed mood 08/20/2015  . Chronic epididymitis 06/19/2015  . Groin pain, chronic, right 06/19/2015  . Incarcerated right inguinal hernia 02/16/2015  . Essential hypertension 01/02/2015  . Dyslipidemia   . ESRD on hemodialysis ([place])   . Anemia of chronic kidney failure 06/24/2013    Past Surgical History:  Procedure Laterality Date  . CAPD INSERTION    . CAPD REMOVAL    .  INGUINAL HERNIA REPAIR Right 02/14/2015   Procedure: REPAIR INCARCERATED RIGHT INGUINAL HERNIA;  Surgeon: [name], MD;  Location: [facility];  Service: General;  Laterality: Right;  . INSERTION OF DIALYSIS CATHETER Right 09/23/2015   Procedure: exchange of Right internal Dialysis Catheter.;  Surgeon: [name], MD;  Location: [facility];  Service: Vascular;  Laterality: Right;  . KIDNEY RECEIPIENT  2006   failed and started HD in March 2014  . LEFT HEART CATHETERIZATION WITH CORONARY ANGIOGRAM N/A 09/02/2014   Procedure: LEFT HEART CATHETERIZATION WITH CORONARY ANGIOGRAM;  Surgeon: [name], MD;   Location: [facility] CATH LAB;  Service: Cardiovascular;  Laterality: N/A;       Home Medications    Prior to Admission medications   Medication Sig Start Date End Date Taking? Authorizing Provider  allopurinol (ZYLOPRIM) 100 MG tablet Take 100 mg by mouth daily.    Historical Provider, MD  amLODipine (NORVASC) 10 MG tablet Take 10 mg by mouth 2 (two) times daily.     Historical Provider, MD  atorvastatin (LIPITOR) 40 MG tablet Take 1 tablet (40 mg total) by mouth daily at 6 PM. 09/02/14   Barton Dubois, MD  carvedilol (COREG) 25 MG tablet Take 25 mg by mouth 2 (two) times daily with a meal.    Historical Provider, MD  cinacalcet (SENSIPAR) 30 MG tablet Take 30 mg by mouth every evening.     Historical Provider, MD  cloNIDine (CATAPRES - DOSED IN MG/24 HR) 0.3 mg/24hr patch Place 0.3 mg onto the skin once a week.    Historical Provider, MD  doxazosin (CARDURA) 2 MG tablet Take 2 mg by mouth daily.    Historical Provider, MD  famotidine (PEPCID) 20 MG tablet Take 1 tablet (20 mg total) by mouth 2 (two) times daily. 02/13/16   Charlesetta Shanks, MD  gabapentin (NEURONTIN) 100 MG capsule Take 1 capsule (100 mg total) by mouth at bedtime. Patient taking differently: Take 100 mg by mouth 3 (three) times daily.  05/21/16   [name], DO  hydrALAZINE (APRESOLINE) 100 MG tablet Take 100 mg by mouth 2 (two) times daily.     Historical Provider, MD  isosorbide mononitrate (IMDUR) 60 MG 24 hr tablet Take 1 tablet (60 mg total) by mouth daily. 01/09/16   Smiley Houseman, MD  omeprazole (PRILOSEC) 20 MG capsule Take 20 mg by mouth daily. 07/01/13   Historical Provider, MD  ondansetron (ZOFRAN ODT) 4 MG disintegrating tablet Take 1 tablet (4 mg total) by mouth every 8 (eight) hours as needed for nausea or vomiting. 05/16/16   Sherwood Gambler, MD  oxycodone (OXY-IR) 5 MG capsule Take 1 capsule (5 mg total) by mouth every 4 (four) hours as needed for pain. 05/30/16   [name], MD  oxyCODONE-acetaminophen  (PERCOCET/ROXICET) 5-325 MG tablet Take 1 tablet by mouth every 6 (six) hours as needed for severe pain.    Historical Provider, MD  predniSONE (DELTASONE) 10 MG tablet Take 10 mg by mouth daily with breakfast.    Historical Provider, MD  sevelamer carbonate (RENVELA) 800 MG tablet Take 1,600 mg by mouth 3 (three) times daily with meals.     Historical Provider, MD  warfarin (COUMADIN) 2 MG tablet Take 2 mg by mouth daily. Take with 29m tablet to equal 754m7/12/17   Historical Provider, MD  warfarin (COUMADIN) 5 MG tablet Take 5 mg by mouth See admin instructions. Take 1 tablet (5 mg) by mouth with a 2 mg tablet  for a 7 mg dose 01/31/16   Historical Provider, MD    Family History Family History  Problem Relation Age of Onset  . Hypertension Other     Social History Social History  Substance Use Topics  . Smoking status: Former Smoker    Packs/day: 0.00    Years: 1.00    Types: Cigarettes  . Smokeless tobacco: Never Used     Comment: quit Jan 2014  . Alcohol use No     Allergies   Butalbital-apap-caffeine; Ferrlecit [na ferric gluc cplx in sucrose]; Minoxidil; Tylenol [acetaminophen]; and Darvocet [propoxyphene n-acetaminophen]   Review of Systems Review of Systems 10 Systems reviewed and are negative for acute change except as noted in the HPI.   Physical Exam Updated Vital Signs BP 142/94   Pulse 66   Temp 98.1 F (36.7 C) (Oral)   Resp 18   SpO2 99%   Physical Exam  Constitutional: He is oriented to person, place, and time. He appears well-developed and well-nourished. No distress.  Eating breakfast  HENT:  Head: Normocephalic and atraumatic.  Moist mucous membranes  Eyes: Conjunctivae are normal. Pupils are equal, round, and reactive to light.  Neck: Neck supple.  Cardiovascular: Normal rate, regular rhythm and normal heart sounds.   No murmur heard. Pulmonary/Chest: Effort normal and breath sounds normal.  Central line in R upper chest w/ no surrounding  drainage or redness  Abdominal: Soft. Bowel sounds are normal. He exhibits no distension. There is no tenderness.  Musculoskeletal: He exhibits no edema.  AV fistula in L lower arm w/ bandaid in place  Neurological: He is alert and oriented to person, place, and time.  Fluent speech  Skin: Skin is warm and dry.  Psychiatric:  Calm, cooperative.  Nursing note and vitals reviewed.    ED Treatments / Results  Labs (all labs ordered are listed, but only abnormal results are displayed) Labs Reviewed - No data to display  EKG  EKG Interpretation None       Radiology No results found.  Procedures Procedures (including critical care time)  Medications Ordered in ED Medications - No data to display   Initial Impression / Assessment and Plan / ED Course  I have reviewed the triage vital signs and the nursing notes.  Pertinent labs  that were available during my care of the patient were reviewed by me and considered in my medical decision making (see chart for details).  Clinical Course    PT here for routine dialysis. Well appearing w/ normal VS. No SOB or hypoxia. K is 5.2. Pt refusing all other workup, just wants to be dialyzed. I discussed with Dr. Marval Regal, appreciate assistance. He will call to arrange dialysis from ED with Dr. Posey Pronto.   Final Clinical Impressions(s) / ED Diagnoses   Final diagnoses:  None    New Prescriptions New Prescriptions   No medications on file     Sharlett Iles, MD 06/28/16 [phone]

## 2016-06-28 NOTE — ED Triage Notes (Signed)
Patient presents here today for dialysis  Requesting breakfast

## 2016-06-28 NOTE — ED Notes (Signed)
Pt approached this EMT stating that he would be back. Pt going to [employer] to eat his breakfast. Pt informed that if he leaves the emergency department he would be leaving AMA. Pt raising voice at this EMT stating that he would be coming back. Pt informed that these were the rules and that I would not be arguing with him about the topic. Pt turned and left out ER doors.

## 2016-06-28 NOTE — Discharge Instructions (Signed)
PLEASE RETURN AS SOON AS POSSIBLE FOR DIALYSIS.

## 2016-06-28 NOTE — ED Notes (Signed)
Pt left jacket behind, Mickel Baas, EMT took jacket to RN 1st.

## 2016-06-28 NOTE — ED Notes (Signed)
Pt provided w/ apple juice per MD verbal order.

## 2016-06-28 NOTE — ED Notes (Signed)
Pt was noted to be walking back onto the unit with food, staff were under the impression he was in the restroom. Pt states he "spoke with his dialysis peeps" who told him it may be as late at 57 or 0000 before they are able to take him for dialysis treatment.  Pt states he does not want to stay but wants to know his numbers before he goes.  Advised pt to wait in his room until Dr Rex Kras could speak with him.

## 2016-06-29 ENCOUNTER — Emergency Department (HOSPITAL_COMMUNITY)
Admission: EM | Admit: 2016-06-29 | Discharge: 2016-06-30 | Disposition: A | Discharge status: Home / Self Care | Payer: Medicare Other | Source: Home / Self Care | Attending: Emergency Medicine | Admitting: Emergency Medicine

## 2016-06-29 ENCOUNTER — Non-Acute Institutional Stay (HOSPITAL_COMMUNITY)
Admission: EM | Admit: 2016-06-29 | Discharge: 2016-06-29 | Disposition: A | Discharge status: Home / Self Care | Payer: Medicare Other | Attending: Emergency Medicine | Admitting: Emergency Medicine

## 2016-06-29 ENCOUNTER — Encounter (HOSPITAL_COMMUNITY): Payer: Self-pay | Admitting: Emergency Medicine

## 2016-06-29 DIAGNOSIS — E1122 Type 2 diabetes mellitus with diabetic chronic kidney disease: Secondary | ICD-10-CM | POA: Insufficient documentation

## 2016-06-29 DIAGNOSIS — E875 Hyperkalemia: Secondary | ICD-10-CM | POA: Diagnosis not present

## 2016-06-29 DIAGNOSIS — K219 Gastro-esophageal reflux disease without esophagitis: Secondary | ICD-10-CM | POA: Insufficient documentation

## 2016-06-29 DIAGNOSIS — Z7952 Long term (current) use of systemic steroids: Secondary | ICD-10-CM | POA: Diagnosis not present

## 2016-06-29 DIAGNOSIS — I132 Hypertensive heart and chronic kidney disease with heart failure and with stage 5 chronic kidney disease, or end stage renal disease: Secondary | ICD-10-CM | POA: Insufficient documentation

## 2016-06-29 DIAGNOSIS — Z992 Dependence on renal dialysis: Secondary | ICD-10-CM | POA: Insufficient documentation

## 2016-06-29 DIAGNOSIS — N186 End stage renal disease: Secondary | ICD-10-CM

## 2016-06-29 DIAGNOSIS — D631 Anemia in chronic kidney disease: Secondary | ICD-10-CM | POA: Diagnosis not present

## 2016-06-29 DIAGNOSIS — I428 Other cardiomyopathies: Secondary | ICD-10-CM | POA: Insufficient documentation

## 2016-06-29 DIAGNOSIS — Z87891 Personal history of nicotine dependence: Secondary | ICD-10-CM | POA: Insufficient documentation

## 2016-06-29 DIAGNOSIS — Z79899 Other long term (current) drug therapy: Secondary | ICD-10-CM | POA: Diagnosis not present

## 2016-06-29 DIAGNOSIS — I120 Hypertensive chronic kidney disease with stage 5 chronic kidney disease or end stage renal disease: Secondary | ICD-10-CM | POA: Diagnosis not present

## 2016-06-29 DIAGNOSIS — Z86718 Personal history of other venous thrombosis and embolism: Secondary | ICD-10-CM | POA: Insufficient documentation

## 2016-06-29 DIAGNOSIS — Z7901 Long term (current) use of anticoagulants: Secondary | ICD-10-CM | POA: Insufficient documentation

## 2016-06-29 DIAGNOSIS — I5042 Chronic combined systolic (congestive) and diastolic (congestive) heart failure: Secondary | ICD-10-CM | POA: Insufficient documentation

## 2016-06-29 DIAGNOSIS — I2510 Atherosclerotic heart disease of native coronary artery without angina pectoris: Secondary | ICD-10-CM | POA: Diagnosis not present

## 2016-06-29 DIAGNOSIS — E785 Hyperlipidemia, unspecified: Secondary | ICD-10-CM | POA: Diagnosis not present

## 2016-06-29 LAB — BASIC METABOLIC PANEL
ANION GAP: 12 (ref 5–15)
BUN: 51 mg/dL — ABNORMAL HIGH (ref 6–20)
CHLORIDE: 99 mmol/L — AB (ref 101–111)
CO2: 28 mmol/L (ref 22–32)
Calcium: 8.3 mg/dL — ABNORMAL LOW (ref 8.9–10.3)
Creatinine, Ser: 12.46 mg/dL — ABNORMAL HIGH (ref 0.61–1.24)
GFR calc non Af Amer: 4 mL/min — ABNORMAL LOW (ref >60)
GFR, EST AFRICAN AMERICAN: 5 mL/min — AB (ref >60)
Glucose, Bld: 142 mg/dL — ABNORMAL HIGH (ref 65–99)
POTASSIUM: 5.2 mmol/L — AB (ref 3.5–5.1)
Sodium: 139 mmol/L (ref 135–145)

## 2016-06-29 LAB — CBC
HEMATOCRIT: 31.6 % — AB (ref 39.0–52.0)
HEMOGLOBIN: 10.2 g/dL — AB (ref 13.0–17.0)
MCH: 28.9 pg (ref 26.0–34.0)
MCHC: 32.3 g/dL (ref 30.0–36.0)
MCV: 89.5 fL (ref 78.0–100.0)
Platelets: 155 10*3/uL (ref 150–400)
RBC: 3.53 MIL/uL — AB (ref 4.22–5.81)
RDW: 16.4 % — ABNORMAL HIGH (ref 11.5–15.5)
WBC: 4.9 10*3/uL (ref 4.0–10.5)

## 2016-06-29 LAB — PROTIME-INR
INR: 1.15
Prothrombin Time: 14.8 seconds (ref 11.4–15.2)

## 2016-06-29 IMAGING — CR DG CHEST 2V
2 series · 2 of 2 positions shown · non-contrast
Comparison: Prior radiograph from 01/25/2014

CLINICAL DATA: SHORTNESS OF BREATH

EXAM:
CHEST  2 VIEW

[w chest pa]
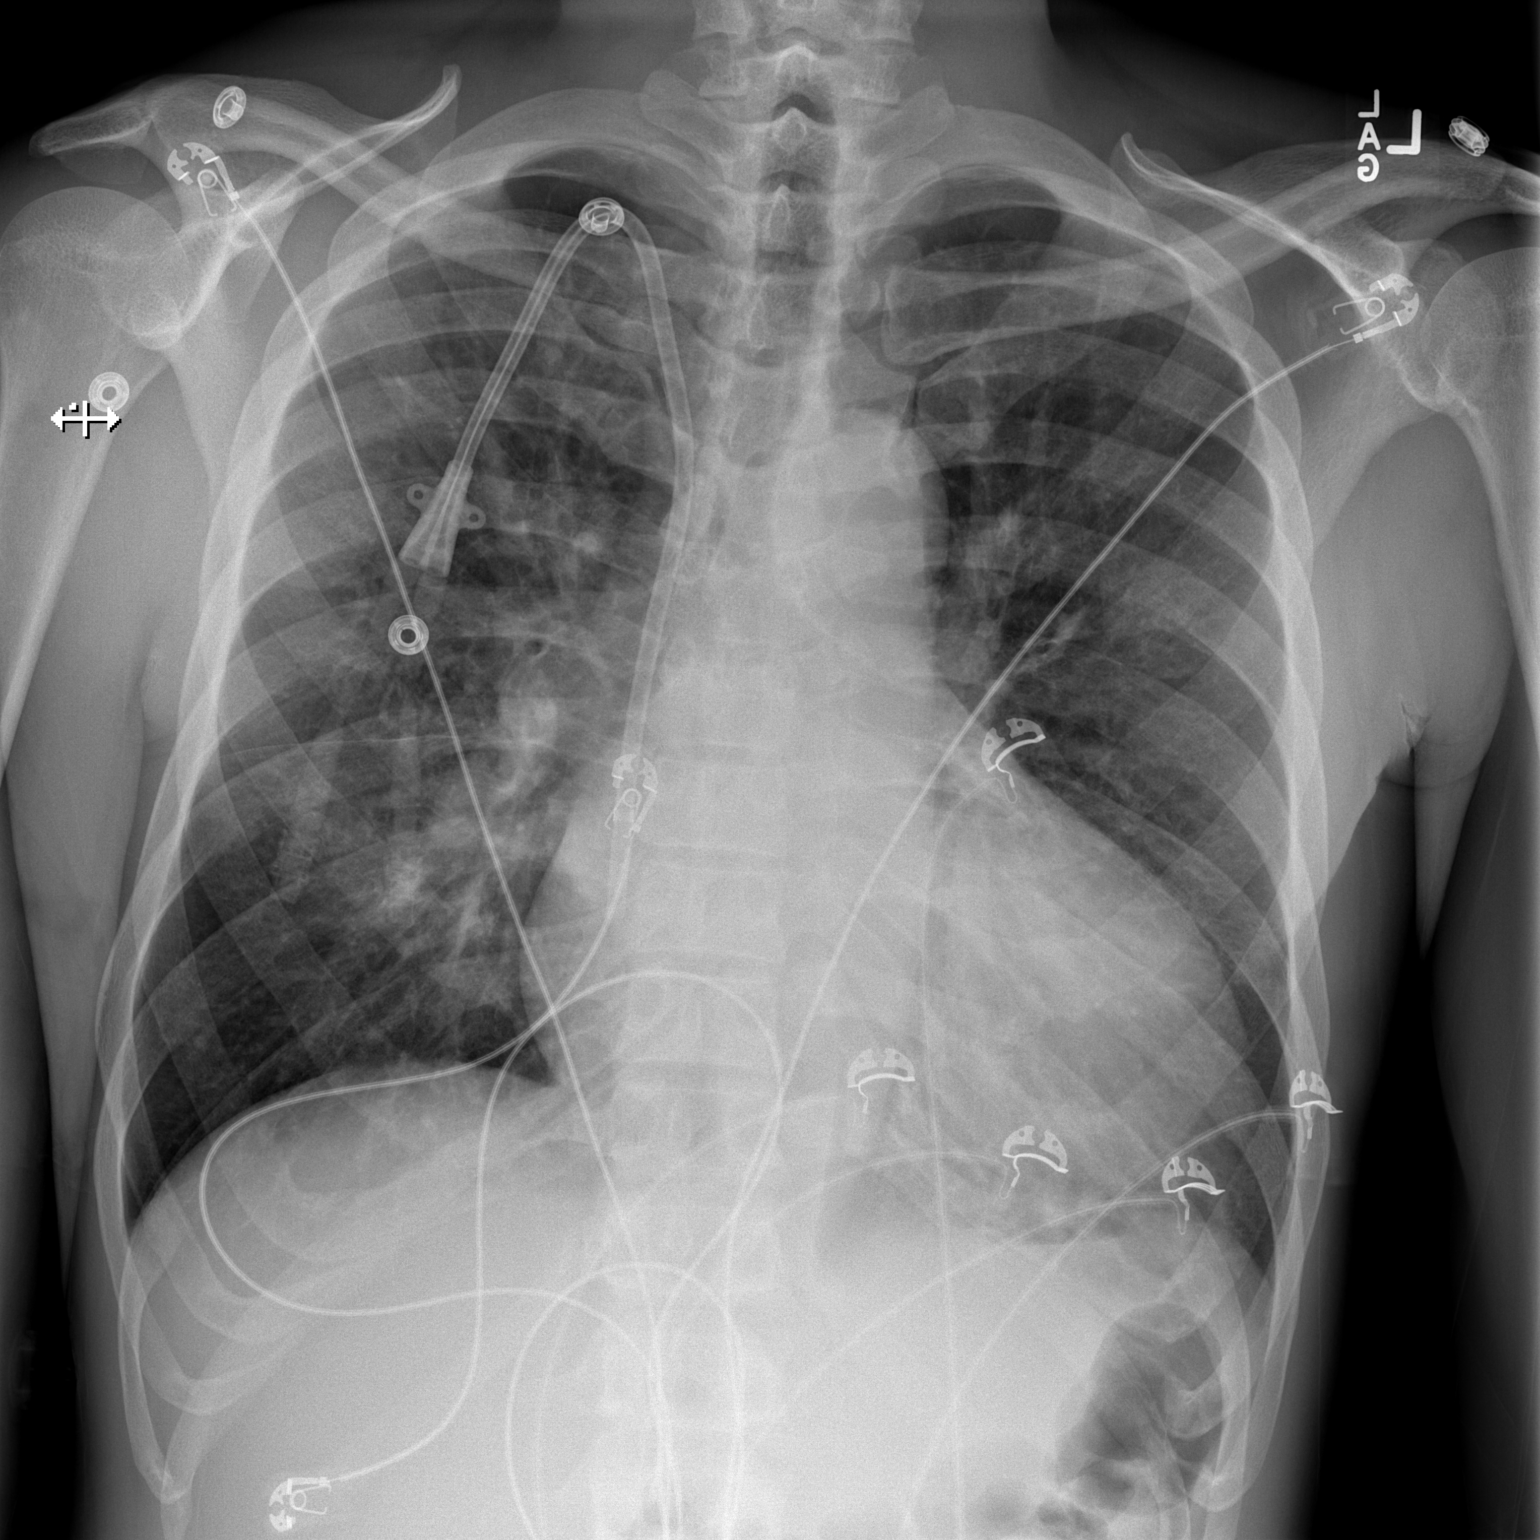

[w chest lat]
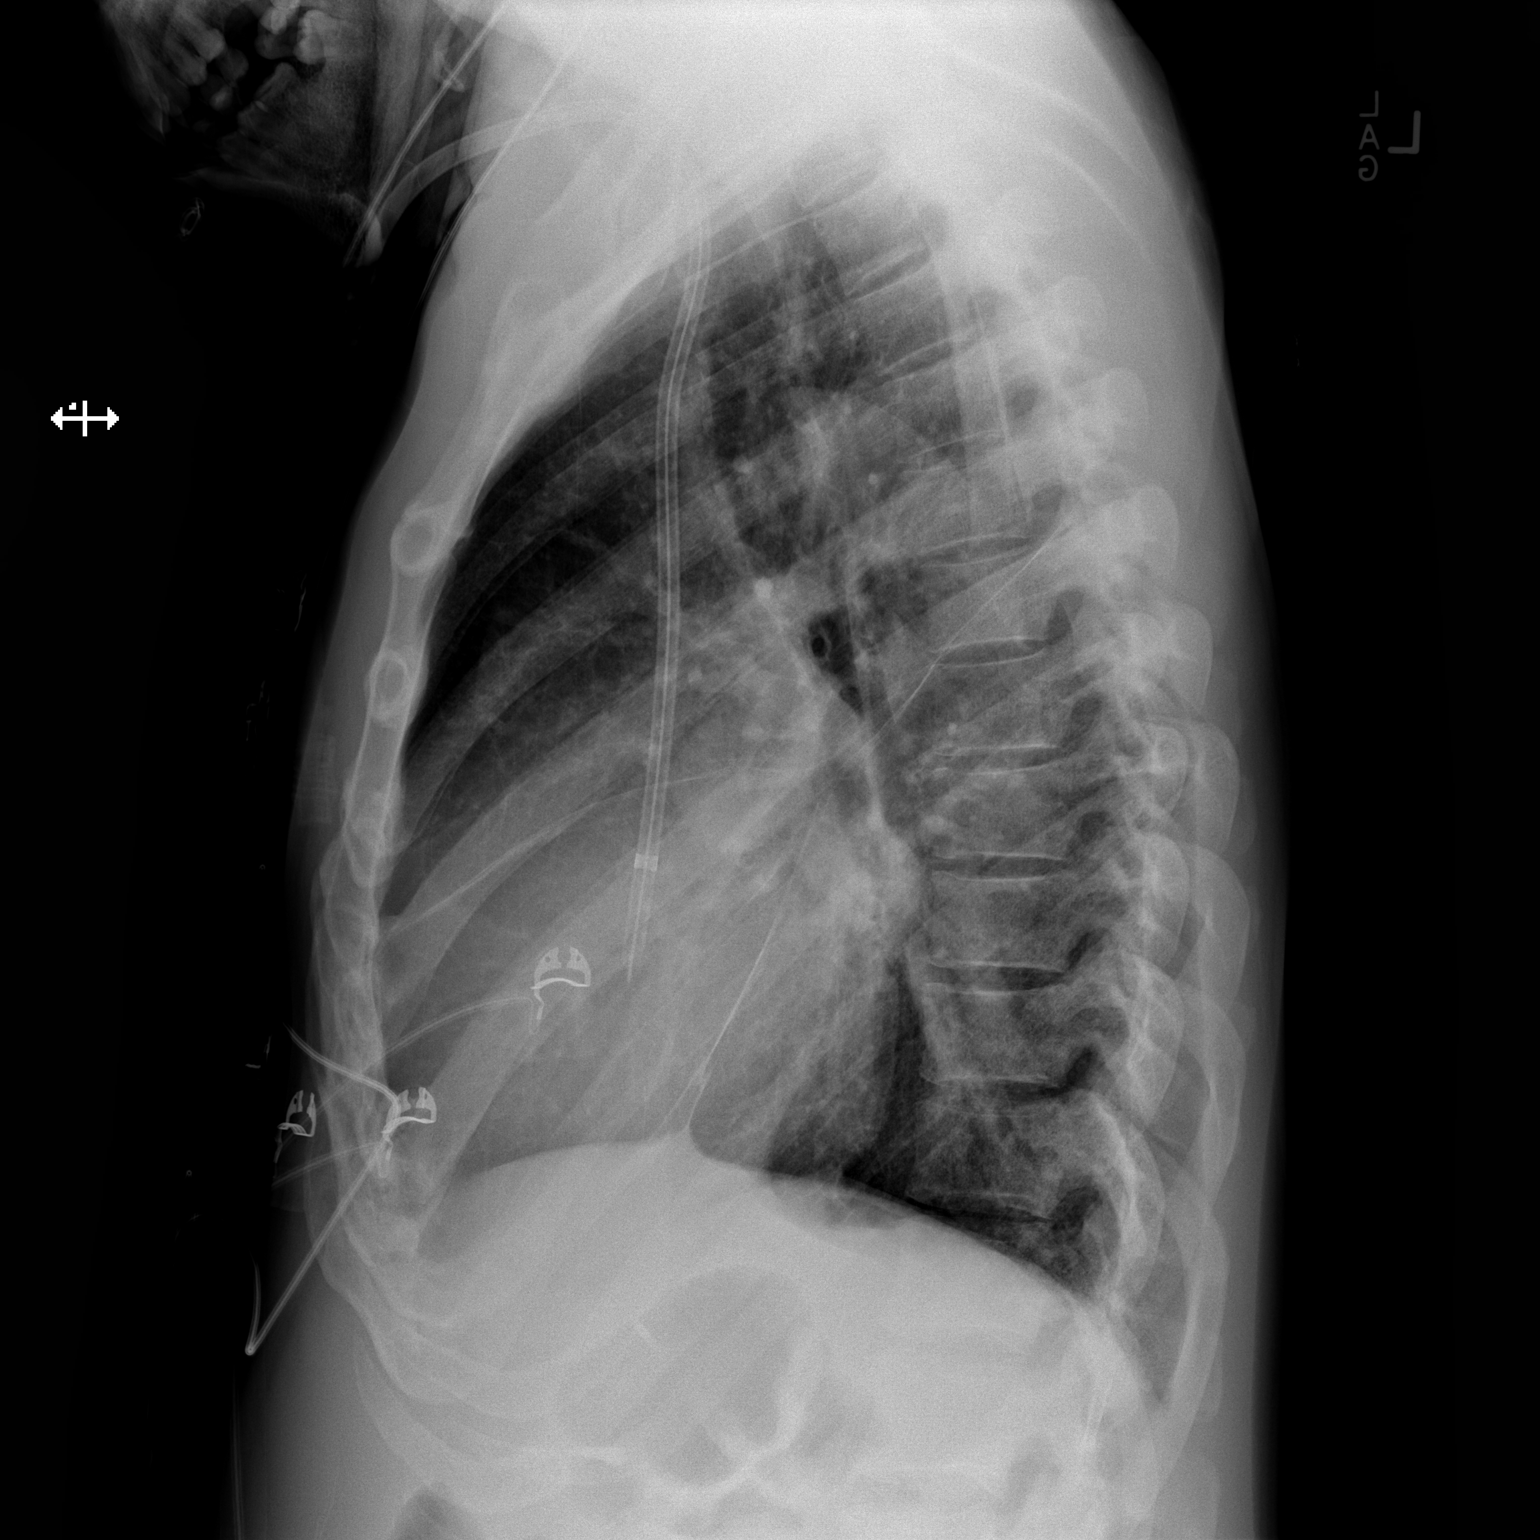

[2 of 2 positions shown; findings below may reference images not displayed]

FINDINGS: Right-sided hemodialysis catheter is unchanged. Cardiomegaly is
stable. Mediastinal silhouette within normal limits.

Patient is slightly rotated to the left, accentuating the right
perihilar structures. No pulmonary edema or pleural effusion. No
pneumothorax. No focal infiltrates identified.

No acute osseous abnormality.
IMPRESSION: 1. Stable cardiomegaly without pulmonary edema. No focal infiltrates
identified.

## 2016-06-29 NOTE — ED Notes (Signed)
Attempted to obtain EKG however patient states "we are not doing an EKG, I will let you draw blood, but you are not putting me on the monitor." This EMT notified the RN and MD

## 2016-06-29 NOTE — ED Triage Notes (Signed)
Pt seen yesterday needing dialysis. Was told to come back today

## 2016-06-29 NOTE — ED Notes (Signed)
Pt refused his paperwork aware that our reccommendation is that pt. To stay until dialysis

## 2016-06-29 NOTE — ED Provider Notes (Addendum)
[facility] DEPT Provider Note   CSN: [id number] Arrival date & time: 06/29/16  4010     History   Chief Complaint Chief Complaint  Patient presents with  . Vascular Access Problem    HPI Frank [name] is a 50 y.o. male.  HPI 50 year old male with extensive past medical history including DVT/PE, nonischemic heart myopathy, and end-stage renal disease who presents for dialysis. Patient was just discharged on 12/6 following admission for emergent dialysis. He is currently scheduled to begin dialysis at [facility] once his AV fistula has matured. He was seen in the ED yesterday for dialysis as he does not currently have a dialysis center however, due to a long wait, he left and returned today for dialysis. He did not receive the session yesterday. He states he otherwise feels symptomatically very well. Denies any significant shortness of breath. Of note, he is 9 pounds above his dry weight. Denies any chest pain. He states he has been compliant with his medications but has a reported history of chronic nonadherence to his Coumadin.  Past Medical History:  Diagnosis Date  . Anemia   . Anxiety   . Chronic combined systolic and diastolic CHF (congestive heart failure) (HCC)    a. EF 20-25% by echo in 08/2015 b. echo 10/2015: EF 35-40%, diffuse HK, severe LAE, moderate RAE, small pericardial effusion  . Complication of anesthesia    itching, sore throat  . Depression   . Dialysis patient ([place])   . ESRD (end stage renal disease) ([place])    due to HTN per patient, followed at [facility], s/p failed kidney transplant - dialysis Tue, Th, Sat  . Hyperkalemia 12/2015  . Hypertension   . Junctional rhythm    a. noted in 08/2015: hyperkalemic at that time  b. 12/2015: presented in junctional rhythm w/ K+ of 6.6. Resolved with improvement of K+ levels.  . Nonischemic cardiomyopathy ([place])    a. 08/2014: cath showing minimal CAD, but tortuous arteries noted.   . Personal history of DVT (deep vein  thrombosis)/ PE 05/26/2016   In Oct 2015 had small subsemental LUL PE w/o DVT (LE dopplers neg) and was felt to be HD cath related, treated w coumadin.  IN May 2016 had small vein DVT (acute/subacute) in the R basilic/ brachial veins of the RUE, resumed on coumadin.  Had R sided HD cath at that time.    . Renal insufficiency   . Shortness of breath   . Type II diabetes mellitus (HCC)    No history per patient, but remains under history as A1c would not be accurate given on dialysis    Patient Active Problem List   Diagnosis Date Noted  . Anemia associated with chronic renal failure   . End-stage renal disease on hemodialysis ([place])   . Subtherapeutic international normalized ratio (INR)   . Hypertension   . Dialysis patient, noncompliant ([place]) 06/22/2016  . ESRD (end stage renal disease) on dialysis ([place])   . ESRD (end stage renal disease) ([place]) 06/20/2016  . ESRD needing dialysis ([place]) 06/14/2016  . Uremia 06/07/2016  . Hyperkalemia 05/29/2016  . Chronic back pain 05/29/2016  . GERD (gastroesophageal reflux disease) 05/29/2016  . Personal history of DVT (deep vein thrombosis)/ PE 05/26/2016  . Bradycardia 03/31/2016  . Nonischemic cardiomyopathy ([place]) 01/09/2016  . Bilateral low back pain without sciatica   . Left renal mass 10/30/2015  . Constipation 10/30/2015  . Adjustment disorder with mixed anxiety and depressed mood 08/20/2015  . Chronic epididymitis  06/19/2015  . Groin pain, chronic, right 06/19/2015  . Incarcerated right inguinal hernia 02/16/2015  . Essential hypertension 01/02/2015  . Dyslipidemia   . ESRD on hemodialysis ([place])   . Anemia of chronic kidney failure 06/24/2013    Past Surgical History:  Procedure Laterality Date  . CAPD INSERTION    . CAPD REMOVAL    . INGUINAL HERNIA REPAIR Right 02/14/2015   Procedure: REPAIR INCARCERATED RIGHT INGUINAL HERNIA;  Surgeon: [name], MD;  Location: [facility];  Service: General;  Laterality: Right;  . INSERTION OF  DIALYSIS CATHETER Right 09/23/2015   Procedure: exchange of Right internal Dialysis Catheter.;  Surgeon: [name], MD;  Location: [facility];  Service: Vascular;  Laterality: Right;  . KIDNEY RECEIPIENT  2006   failed and started HD in March 2014  . LEFT HEART CATHETERIZATION WITH CORONARY ANGIOGRAM N/A 09/02/2014   Procedure: LEFT HEART CATHETERIZATION WITH CORONARY ANGIOGRAM;  Surgeon: [name], MD;  Location: [facility] CATH LAB;  Service: Cardiovascular;  Laterality: N/A;       Home Medications    Prior to Admission medications   Medication Sig Start Date End Date Taking? Authorizing Provider  allopurinol (ZYLOPRIM) 100 MG tablet Take 100 mg by mouth daily.    Historical Provider, MD  amLODipine (NORVASC) 10 MG tablet Take 10 mg by mouth 2 (two) times daily.     Historical Provider, MD  atorvastatin (LIPITOR) 40 MG tablet Take 1 tablet (40 mg total) by mouth daily at 6 PM. 09/02/14   Barton Dubois, MD  carvedilol (COREG) 25 MG tablet Take 25 mg by mouth 2 (two) times daily with a meal.    Historical Provider, MD  cinacalcet (SENSIPAR) 30 MG tablet Take 30 mg by mouth every evening.     Historical Provider, MD  cloNIDine (CATAPRES - DOSED IN MG/24 HR) 0.3 mg/24hr patch Place 0.3 mg onto the skin once a week.    Historical Provider, MD  doxazosin (CARDURA) 2 MG tablet Take 2 mg by mouth daily.    Historical Provider, MD  famotidine (PEPCID) 20 MG tablet Take 1 tablet (20 mg total) by mouth 2 (two) times daily. 02/13/16   Charlesetta Shanks, MD  gabapentin (NEURONTIN) 100 MG capsule Take 1 capsule (100 mg total) by mouth at bedtime. Patient taking differently: Take 100 mg by mouth 3 (three) times daily.  05/21/16   [name], DO  hydrALAZINE (APRESOLINE) 100 MG tablet Take 100 mg by mouth 2 (two) times daily.     Historical Provider, MD  isosorbide mononitrate (IMDUR) 60 MG 24 hr tablet Take 1 tablet (60 mg total) by mouth daily. 01/09/16   Smiley Houseman, MD  omeprazole (PRILOSEC) 20 MG  capsule Take 20 mg by mouth daily. 07/01/13   Historical Provider, MD  ondansetron (ZOFRAN ODT) 4 MG disintegrating tablet Take 1 tablet (4 mg total) by mouth every 8 (eight) hours as needed for nausea or vomiting. 05/16/16   Sherwood Gambler, MD  oxycodone (OXY-IR) 5 MG capsule Take 1 capsule (5 mg total) by mouth every 4 (four) hours as needed for pain. 05/30/16   [name], MD  oxyCODONE-acetaminophen (PERCOCET/ROXICET) 5-325 MG tablet Take 1 tablet by mouth every 6 (six) hours as needed for severe pain.    Historical Provider, MD  predniSONE (DELTASONE) 10 MG tablet Take 10 mg by mouth daily with breakfast.    Historical Provider, MD  sevelamer carbonate (RENVELA) 800 MG tablet Take 1,600 mg by mouth 3 (  three) times daily with meals.     Historical Provider, MD  warfarin (COUMADIN) 2 MG tablet Take 2 mg by mouth daily. Take with 31m tablet to equal 743m7/12/17   Historical Provider, MD  warfarin (COUMADIN) 5 MG tablet Take 5 mg by mouth See admin instructions. Take 1 tablet (5 mg) by mouth with a 2 mg tablet for a 7 mg dose 01/31/16   Historical Provider, MD    Family History Family History  Problem Relation Age of Onset  . Hypertension Other     Social History Social History  Substance Use Topics  . Smoking status: Former Smoker    Packs/day: 0.00    Years: 1.00    Types: Cigarettes  . Smokeless tobacco: Never Used     Comment: quit Jan 2014  . Alcohol use No     Allergies   Butalbital-apap-caffeine; Ferrlecit [na ferric gluc cplx in sucrose]; Minoxidil; Tylenol [acetaminophen]; and Darvocet [propoxyphene n-acetaminophen]   Review of Systems Review of Systems  Constitutional: Negative for chills, fatigue and fever.  HENT: Negative for congestion and rhinorrhea.   Eyes: Negative for visual disturbance.  Respiratory: Negative for cough, shortness of breath and wheezing.   Cardiovascular: Negative for chest pain and leg swelling.  Gastrointestinal: Negative for abdominal  pain, diarrhea, nausea and vomiting.  Genitourinary: Negative for dysuria and flank pain.  Musculoskeletal: Negative for neck pain and neck stiffness.  Skin: Negative for rash and wound.  Allergic/Immunologic: Negative for immunocompromised state.  Neurological: Negative for syncope, weakness and headaches.  All other systems reviewed and are negative.    Physical Exam Updated Vital Signs BP 159/94   Pulse 68   Resp 16   SpO2 98%   Physical Exam  Constitutional: He is oriented to person, place, and time. He appears well-developed and well-nourished. No distress.  In no distress, eating [employer]  HENT:  Head: Normocephalic and atraumatic.  Mouth/Throat: Oropharynx is clear and moist. No oropharyngeal exudate.  Eyes: Conjunctivae are normal.  Neck: Neck supple.  Cardiovascular: Normal rate, regular rhythm and normal heart sounds.  Exam reveals no friction rub.   No murmur heard. Pulmonary/Chest: Effort normal and breath sounds normal. No respiratory distress. He has no wheezes. He has no rales.  [place] line in plane with no surrounding erythema or TTP. Sterile dressing in place.  Abdominal: Soft. Bowel sounds are normal. He exhibits no distension.  Musculoskeletal: He exhibits edema.  Neurological: He is alert and oriented to person, place, and time. He exhibits normal muscle tone.  Skin: Skin is warm. Capillary refill takes less than 2 seconds.  LAVF site c/d/i. Palpable and audible thrill.  Psychiatric: He has a normal mood and affect.  Nursing note and vitals reviewed.    ED Treatments / Results  Labs (all labs ordered are listed, but only abnormal results are displayed) Labs Reviewed  CBC - Abnormal; Notable for the following:       Result Value   RBC 3.53 (*)    Hemoglobin 10.2 (*)    HCT 31.6 (*)    RDW 16.4 (*)    All other components within normal limits  BASIC METABOLIC PANEL - Abnormal; Notable for the following:    Potassium 5.2 (*)    Chloride 99 (*)     Glucose, Bld 142 (*)    BUN 51 (*)    Creatinine, Ser 12.46 (*)    Calcium 8.3 (*)    GFR calc non Af Amer 4 (*)  GFR calc Af Amer 5 (*)    All other components within normal limits  PROTIME-INR    EKG  EKG Interpretation None       Radiology No results found.  Procedures Procedures (including critical care time)  Medications Ordered in ED Medications - No data to display   Initial Impression / Assessment and Plan / ED Course  I have reviewed the triage vital signs and the nursing notes.  Pertinent labs & imaging results that were available during my care of the patient were reviewed by me and considered in my medical decision making (see chart for details).  Clinical Course     50 yo M with PMHx as above including HTN, HLD, NICCM with CHF, and ESRD who presents for scheduled dialysis. Pt seen yesterday for dialysis (last dialyzed on Wed) but left due to long wait. Here for session. Pt o/w denies any other complaints and refuses further work-up. Denies CP, SOB, abdominal pain, nausea, vomiting, or diarrhea. No fevers. Labs show baseline anemia, normal WBC. BMP shows mild hyperkalemia, c/w known ESRD. D/w Dr. Kathe Mariner at 8:30 AM, will place pt on HD schedule. Pt o/w HDS, at baseline. I discussed the need for him to be compliant with his coumadin but he is unwilling to receive lovenox/bridge/other anticoagulation at this time despite discussion of risks/benefits.   12:36 PM while awaiting dialysis bed, patient learned that that would not be available until 8 PM. He subsequently left the department [place]. I discussed the risks and benefits of doing so. Patient is well versed on these risks and has a history of leaving AMA. He is alert, oriented, not intoxicated, and demonstrates appropriate capacity and competency. Discharged AMA with instructions to return for dialysis tonight.  Final Clinical Impressions(s) / ED Diagnoses   Final diagnoses:  ESRD (end  stage renal disease) ([place])    New Prescriptions New Prescriptions   No medications on file     Duffy Bruce, MD 06/29/16 [place], MD 06/29/16 1236

## 2016-06-29 NOTE — Discharge Instructions (Signed)
YOUR POTASSIUM IS VERY HIGH. YOU NEED URGENT DIALYSIS, AND SHOULD STAY IN THE ED FOR THIS. THE BED WILL BE AVAILABLE THIS EVENING FOR YOUR DIALYSIS.  LEAVING PUTS YOU AT RISK OF SERIOUS INJURY, INCLUDING PAIN AND DEATH

## 2016-06-29 NOTE — ED Notes (Signed)
Pt refused vital signs at triage.

## 2016-06-29 NOTE — ED Triage Notes (Signed)
Pt. is here for his hemodialysis treatment .

## 2016-06-29 NOTE — ED Notes (Signed)
Apple Juice given to pt.

## 2016-06-29 NOTE — ED Notes (Signed)
Pt out to desk states that he spoke with hemodialysis they are not going to be able to get him in to dialysis as per pt. Until after 8 pm tonight pt wanting to leave and come back ER MD made aware pt appears in no distress

## 2016-06-30 ENCOUNTER — Non-Acute Institutional Stay (HOSPITAL_COMMUNITY)
Admission: EM | Admit: 2016-06-30 | Discharge: 2016-06-30 | Disposition: A | Discharge status: Home / Self Care | Payer: Medicare Other | Attending: Emergency Medicine | Admitting: Emergency Medicine

## 2016-06-30 ENCOUNTER — Encounter (HOSPITAL_COMMUNITY): Payer: Self-pay

## 2016-06-30 DIAGNOSIS — I132 Hypertensive heart and chronic kidney disease with heart failure and with stage 5 chronic kidney disease, or end stage renal disease: Secondary | ICD-10-CM | POA: Diagnosis not present

## 2016-06-30 DIAGNOSIS — Z7901 Long term (current) use of anticoagulants: Secondary | ICD-10-CM | POA: Diagnosis not present

## 2016-06-30 DIAGNOSIS — I428 Other cardiomyopathies: Secondary | ICD-10-CM | POA: Insufficient documentation

## 2016-06-30 DIAGNOSIS — I5042 Chronic combined systolic (congestive) and diastolic (congestive) heart failure: Secondary | ICD-10-CM | POA: Diagnosis not present

## 2016-06-30 DIAGNOSIS — Z86718 Personal history of other venous thrombosis and embolism: Secondary | ICD-10-CM | POA: Diagnosis not present

## 2016-06-30 DIAGNOSIS — Z992 Dependence on renal dialysis: Secondary | ICD-10-CM | POA: Diagnosis not present

## 2016-06-30 DIAGNOSIS — E785 Hyperlipidemia, unspecified: Secondary | ICD-10-CM | POA: Insufficient documentation

## 2016-06-30 DIAGNOSIS — K219 Gastro-esophageal reflux disease without esophagitis: Secondary | ICD-10-CM | POA: Diagnosis not present

## 2016-06-30 DIAGNOSIS — Z7952 Long term (current) use of systemic steroids: Secondary | ICD-10-CM | POA: Diagnosis not present

## 2016-06-30 DIAGNOSIS — D631 Anemia in chronic kidney disease: Secondary | ICD-10-CM | POA: Diagnosis not present

## 2016-06-30 DIAGNOSIS — Z87891 Personal history of nicotine dependence: Secondary | ICD-10-CM | POA: Diagnosis not present

## 2016-06-30 DIAGNOSIS — Z79899 Other long term (current) drug therapy: Secondary | ICD-10-CM | POA: Insufficient documentation

## 2016-06-30 DIAGNOSIS — E1122 Type 2 diabetes mellitus with diabetic chronic kidney disease: Secondary | ICD-10-CM | POA: Diagnosis not present

## 2016-06-30 DIAGNOSIS — N186 End stage renal disease: Secondary | ICD-10-CM | POA: Insufficient documentation

## 2016-06-30 LAB — RENAL FUNCTION PANEL
ALBUMIN: 2.7 g/dL — AB (ref 3.5–5.0)
ALBUMIN: 2.9 g/dL — AB (ref 3.5–5.0)
ANION GAP: 13 (ref 5–15)
ANION GAP: 10 (ref 5–15)
BUN: 74 mg/dL — ABNORMAL HIGH (ref 6–20)
BUN: 64 mg/dL — ABNORMAL HIGH (ref 6–20)
CALCIUM: 7.8 mg/dL — AB (ref 8.9–10.3)
CALCIUM: 7.7 mg/dL — AB (ref 8.9–10.3)
CO2: 22 mmol/L (ref 22–32)
CO2: 29 mmol/L (ref 22–32)
Chloride: 100 mmol/L — ABNORMAL LOW (ref 101–111)
Chloride: 100 mmol/L — ABNORMAL LOW (ref 101–111)
Creatinine, Ser: 16.78 mg/dL — ABNORMAL HIGH (ref 0.61–1.24)
Creatinine, Ser: 14.96 mg/dL — ABNORMAL HIGH (ref 0.61–1.24)
GFR calc Af Amer: 3 mL/min — ABNORMAL LOW (ref >60)
GFR, EST AFRICAN AMERICAN: 4 mL/min — AB (ref >60)
GFR, EST NON AFRICAN AMERICAN: 3 mL/min — AB (ref >60)
GFR, EST NON AFRICAN AMERICAN: 3 mL/min — AB (ref >60)
GLUCOSE: 90 mg/dL (ref 65–99)
GLUCOSE: 65 mg/dL (ref 65–99)
PHOSPHORUS: 5.9 mg/dL — AB (ref 2.5–4.6)
PHOSPHORUS: 6.1 mg/dL — AB (ref 2.5–4.6)
Potassium: 6.1 mmol/L — ABNORMAL HIGH (ref 3.5–5.1)
SODIUM: 135 mmol/L (ref 135–145)
SODIUM: 139 mmol/L (ref 135–145)

## 2016-06-30 LAB — CBC
HCT: 28.2 % — ABNORMAL LOW (ref 39.0–52.0)
HEMOGLOBIN: 9.1 g/dL — AB (ref 13.0–17.0)
MCH: 28.3 pg (ref 26.0–34.0)
MCHC: 32.3 g/dL (ref 30.0–36.0)
MCV: 87.6 fL (ref 78.0–100.0)
Platelets: 183 10*3/uL (ref 150–400)
RBC: 3.22 MIL/uL — ABNORMAL LOW (ref 4.22–5.81)
RDW: 16 % — ABNORMAL HIGH (ref 11.5–15.5)
WBC: 5.1 10*3/uL (ref 4.0–10.5)

## 2016-06-30 NOTE — ED Notes (Signed)
Dr. Leonides Schanz at bedside, pt was suppose to be here at 2000 for hemo, Neph states pt does not emergent dialysis a this point, pt advised to come back at 1000 and if staff if here he can be dialyzed. Dr. Leonides Schanz advised pt he can wait in the waiting area if he would like.  Pt verbalized understanding.

## 2016-06-30 NOTE — ED Provider Notes (Signed)
[facility] DEPT Provider Note   CSN: [id number] Arrival date & time: 06/30/16  0708     History   Chief Complaint Chief Complaint  Patient presents with  . Vascular Access Problem    pt  here for dialysis has been here for several days to get dialysis    HPI Frank [name] is a 50 y.o. male.  50 year old male with history of incisional disease presents requesting dialysis. Has been seen multiple times for similar symptoms. Most recently seen several hours ago and was scheduled to have dialysis this morning. He denies any shortness of breath. Does endorse some weakness. He is otherwise at his baseline state of health.      Past Medical History:  Diagnosis Date  . Anemia   . Anxiety   . Chronic combined systolic and diastolic CHF (congestive heart failure) (HCC)    a. EF 20-25% by echo in 08/2015 b. echo 10/2015: EF 35-40%, diffuse HK, severe LAE, moderate RAE, small pericardial effusion  . Complication of anesthesia    itching, sore throat  . Depression   . Dialysis patient ([place])   . ESRD (end stage renal disease) ([place])    due to HTN per patient, followed at [facility], s/p failed kidney transplant - dialysis Tue, Th, Sat  . Hyperkalemia 12/2015  . Hypertension   . Junctional rhythm    a. noted in 08/2015: hyperkalemic at that time  b. 12/2015: presented in junctional rhythm w/ K+ of 6.6. Resolved with improvement of K+ levels.  . Nonischemic cardiomyopathy ([place])    a. 08/2014: cath showing minimal CAD, but tortuous arteries noted.   . Personal history of DVT (deep vein thrombosis)/ PE 05/26/2016   In Oct 2015 had small subsemental LUL PE w/o DVT (LE dopplers neg) and was felt to be HD cath related, treated w coumadin.  IN May 2016 had small vein DVT (acute/subacute) in the R basilic/ brachial veins of the RUE, resumed on coumadin.  Had R sided HD cath at that time.    . Renal insufficiency   . Shortness of breath   . Type II diabetes mellitus (HCC)    No history per  patient, but remains under history as A1c would not be accurate given on dialysis    Patient Active Problem List   Diagnosis Date Noted  . Anemia associated with chronic renal failure   . End-stage renal disease on hemodialysis ([place])   . Subtherapeutic international normalized ratio (INR)   . Hypertension   . Dialysis patient, noncompliant ([place]) 06/22/2016  . ESRD (end stage renal disease) on dialysis ([place])   . ESRD (end stage renal disease) ([place]) 06/20/2016  . ESRD needing dialysis ([place]) 06/14/2016  . Uremia 06/07/2016  . Hyperkalemia 05/29/2016  . Chronic back pain 05/29/2016  . GERD (gastroesophageal reflux disease) 05/29/2016  . Personal history of DVT (deep vein thrombosis)/ PE 05/26/2016  . Bradycardia 03/31/2016  . Nonischemic cardiomyopathy ([place]) 01/09/2016  . Bilateral low back pain without sciatica   . Left renal mass 10/30/2015  . Constipation 10/30/2015  . Adjustment disorder with mixed anxiety and depressed mood 08/20/2015  . Chronic epididymitis 06/19/2015  . Groin pain, chronic, right 06/19/2015  . Incarcerated right inguinal hernia 02/16/2015  . Essential hypertension 01/02/2015  . Dyslipidemia   . ESRD on hemodialysis ([place])   . Anemia of chronic kidney failure 06/24/2013    Past Surgical History:  Procedure Laterality Date  . CAPD INSERTION    . CAPD REMOVAL    .  INGUINAL HERNIA REPAIR Right 02/14/2015   Procedure: REPAIR INCARCERATED RIGHT INGUINAL HERNIA;  Surgeon: [name], MD;  Location: [facility];  Service: General;  Laterality: Right;  . INSERTION OF DIALYSIS CATHETER Right 09/23/2015   Procedure: exchange of Right internal Dialysis Catheter.;  Surgeon: [name], MD;  Location: [facility];  Service: Vascular;  Laterality: Right;  . KIDNEY RECEIPIENT  2006   failed and started HD in March 2014  . LEFT HEART CATHETERIZATION WITH CORONARY ANGIOGRAM N/A 09/02/2014   Procedure: LEFT HEART CATHETERIZATION WITH CORONARY ANGIOGRAM;  Surgeon: [name],  MD;  Location: [facility] CATH LAB;  Service: Cardiovascular;  Laterality: N/A;       Home Medications    Prior to Admission medications   Medication Sig Start Date End Date Taking? Authorizing Provider  allopurinol (ZYLOPRIM) 100 MG tablet Take 100 mg by mouth daily.    Historical Provider, MD  amLODipine (NORVASC) 10 MG tablet Take 10 mg by mouth 2 (two) times daily.     Historical Provider, MD  atorvastatin (LIPITOR) 40 MG tablet Take 1 tablet (40 mg total) by mouth daily at 6 PM. 09/02/14   Barton Dubois, MD  carvedilol (COREG) 25 MG tablet Take 25 mg by mouth 2 (two) times daily with a meal.    Historical Provider, MD  cinacalcet (SENSIPAR) 30 MG tablet Take 30 mg by mouth every evening.     Historical Provider, MD  cloNIDine (CATAPRES - DOSED IN MG/24 HR) 0.3 mg/24hr patch Place 0.3 mg onto the skin once a week.    Historical Provider, MD  doxazosin (CARDURA) 2 MG tablet Take 2 mg by mouth daily.    Historical Provider, MD  famotidine (PEPCID) 20 MG tablet Take 1 tablet (20 mg total) by mouth 2 (two) times daily. 02/13/16   Charlesetta Shanks, MD  gabapentin (NEURONTIN) 100 MG capsule Take 1 capsule (100 mg total) by mouth at bedtime. Patient taking differently: Take 100 mg by mouth 3 (three) times daily.  05/21/16   [name], DO  hydrALAZINE (APRESOLINE) 100 MG tablet Take 100 mg by mouth 2 (two) times daily.     Historical Provider, MD  isosorbide mononitrate (IMDUR) 60 MG 24 hr tablet Take 1 tablet (60 mg total) by mouth daily. 01/09/16   Smiley Houseman, MD  omeprazole (PRILOSEC) 20 MG capsule Take 20 mg by mouth daily. 07/01/13   Historical Provider, MD  ondansetron (ZOFRAN ODT) 4 MG disintegrating tablet Take 1 tablet (4 mg total) by mouth every 8 (eight) hours as needed for nausea or vomiting. 05/16/16   Sherwood Gambler, MD  oxycodone (OXY-IR) 5 MG capsule Take 1 capsule (5 mg total) by mouth every 4 (four) hours as needed for pain. 05/30/16   [name], MD  oxyCODONE-acetaminophen  (PERCOCET/ROXICET) 5-325 MG tablet Take 1 tablet by mouth every 6 (six) hours as needed for severe pain.    Historical Provider, MD  predniSONE (DELTASONE) 10 MG tablet Take 10 mg by mouth daily with breakfast.    Historical Provider, MD  sevelamer carbonate (RENVELA) 800 MG tablet Take 1,600 mg by mouth 3 (three) times daily with meals.     Historical Provider, MD  warfarin (COUMADIN) 2 MG tablet Take 2 mg by mouth daily. Take with 28m tablet to equal 715m7/12/17   Historical Provider, MD  warfarin (COUMADIN) 5 MG tablet Take 5 mg by mouth See admin instructions. Take 1 tablet (5 mg) by mouth with a 2 mg tablet  for a 7 mg dose 01/31/16   Historical Provider, MD    Family History Family History  Problem Relation Age of Onset  . Hypertension Other     Social History Social History  Substance Use Topics  . Smoking status: Former Smoker    Packs/day: 0.00    Years: 1.00    Types: Cigarettes  . Smokeless tobacco: Never Used     Comment: quit Jan 2014  . Alcohol use No     Allergies   Butalbital-apap-caffeine; Ferrlecit [na ferric gluc cplx in sucrose]; Minoxidil; Tylenol [acetaminophen]; and Darvocet [propoxyphene n-acetaminophen]   Review of Systems Review of Systems  All other systems reviewed and are negative.    Physical Exam Updated Vital Signs BP (!) 180/105 (BP Location: Right Arm)   Pulse 68   Temp 97.9 F (36.6 C) (Oral)   Resp 18   Ht _0  (1.88 m)   Wt 72.6 kg   SpO2 97%   BMI 20.54 kg/m   Physical Exam  Constitutional: He is oriented to person, place, and time. He appears well-developed and well-nourished.  Non-toxic appearance. No distress.  HENT:  Head: Normocephalic and atraumatic.  Eyes: Conjunctivae, EOM and lids are normal. Pupils are equal, round, and reactive to light.  Neck: Normal range of motion. Neck supple. No tracheal deviation present. No thyroid mass present.  Cardiovascular: Normal rate, regular rhythm and normal heart sounds.  Exam  reveals no gallop.   No murmur heard. Pulmonary/Chest: Effort normal and breath sounds normal. No stridor. No respiratory distress. He has no decreased breath sounds. He has no wheezes. He has no rhonchi. He has no rales.  Abdominal: Soft. Normal appearance and bowel sounds are normal. He exhibits no distension. There is no tenderness. There is no rebound and no CVA tenderness.  Musculoskeletal: Normal range of motion. He exhibits no edema or tenderness.  Neurological: He is alert and oriented to person, place, and time. He has normal strength. No cranial nerve deficit or sensory deficit. GCS eye subscore is 4. GCS verbal subscore is 5. GCS motor subscore is 6.  Skin: Skin is warm and dry. No abrasion and no rash noted.  Psychiatric: He has a normal mood and affect. His speech is normal and behavior is normal.  Nursing note and vitals reviewed.    ED Treatments / Results  Labs (all labs ordered are listed, but only abnormal results are displayed) Labs Reviewed - No data to display  EKG  EKG Interpretation None       Radiology No results found.  Procedures Procedures (including critical care time)  Medications Ordered in ED Medications - No data to display   Initial Impression / Assessment and Plan / ED Course  I have reviewed the triage vital signs and the nursing notes.  Pertinent labs & imaging results that were available during my care of the patient were reviewed by me and considered in my medical decision making (see chart for details).  Clinical Course     Spoke with dr Azzie Roup, will arrange dialysis  Final Clinical Impressions(s) / ED Diagnoses   Final diagnoses:  None    New Prescriptions New Prescriptions   No medications on file     Lacretia Leigh, MD 06/30/16 [phone]

## 2016-06-30 NOTE — Care Management Note (Signed)
Case Management Note  Patient Details  Name: NEHAL SHIVES MRN: [id number] Date of Birth: 1966-04-15  Subjective/Objective:   Pt off the floor in Dialysis                 Action/Plan:   Expected Discharge Date:                  Expected Discharge Plan:     In-House Referral:     Discharge planning Services     Post Acute Care Choice:    Choice offered to:     DME Arranged:    DME Agency:     HH Arranged:    [place] Agency:     Status of Service:     If discussed at [employer] of Stay Meetings, dates discussed:    Additional Comments:  Delrae Sawyers, RN 06/30/2016, 2:37 PM

## 2016-06-30 NOTE — Care Management Note (Signed)
Case Management Note  Patient Details  Name: Frank [name] MRN: [id number] Date of Birth: August 31, 1965  Subjective/Objective: CM entered room and introduced self. Making pt aware that all pts seen in the ED >4x/40month [facility] Pt stated "I don't feel good can you come back in two hours?"                    Action/Plan: RNCM will return in 2 hrs.    Expected Discharge Date:                  Expected Discharge Plan:     In-House Referral:     Discharge planning Services     Post Acute Care Choice:    Choice offered to:     DME Arranged:    DME Agency:     HH Arranged:    HH Agency:     Status of Service:     If discussed at [employer] dates discussed:    Additional Comments:  CDelrae Sawyers RN 06/30/2016, 9:02 AM

## 2016-06-30 NOTE — ED Provider Notes (Addendum)
By signing my name below, I, [name], attest that this documentation has been prepared under the direction and in the presence of [place], DO. Electronically Signed: [name], ED Scribe. 06/30/16. 12:14 AM.  TIME SEEN: 12:03 AM  CHIEF COMPLAINT:  Chief Complaint  Patient presents with  . Hemodialysis   HPI:  HPI Comments: Frank [name] is a 50 y.o. male with h/o ESRD on HD, PE with noncompliance with coumadin, CHF, HTN and DM who presents to the Emergency Department for dialysis. Pt was seen early yesterday for dialysis but left AMA because he found out he was not able to have dialysis until 8 pm. He had blood work done during this visit which was unremarkable, Potassium was 5.2, INR subtherapeutic. Pt is currently scheduled to begin dialysis at [facility] once his AV fistula has matured but notes he regularly does his scheduled dialysis in the ED since he does not currently have a dialysis center (secondary to behavioral issues). His last dialysis was on 06/26/16 and pt has been to the ED multiple times after, leaving AMA. He states he otherwise feels symptomatically well with the exception of mild back pain. Pt denies fever, chills, chest pain, shortness of breath or any other associated symptoms.   ROS: See HPI Constitutional: no fever  Eyes: no drainage  ENT: no runny nose   Cardiovascular:  no chest pain  Resp: no SOB  GI: no vomiting GU: no dysuria Integumentary: no rash  Allergy: no hives  Musculoskeletal: no leg swelling  Neurological: no slurred speech ROS otherwise negative  PAST MEDICAL HISTORY/PAST SURGICAL HISTORY:  Past Medical History:  Diagnosis Date  . Anemia   . Anxiety   . Chronic combined systolic and diastolic CHF (congestive heart failure) (HCC)    a. EF 20-25% by echo in 08/2015 b. echo 10/2015: EF 35-40%, diffuse HK, severe LAE, moderate RAE, small pericardial effusion  . Complication of anesthesia    itching, sore throat  . Depression   .  Dialysis patient ([place])   . ESRD (end stage renal disease) ([place])    due to HTN per patient, followed at [facility], s/p failed kidney transplant - dialysis Tue, Th, Sat  . Hyperkalemia 12/2015  . Hypertension   . Junctional rhythm    a. noted in 08/2015: hyperkalemic at that time  b. 12/2015: presented in junctional rhythm w/ K+ of 6.6. Resolved with improvement of K+ levels.  . Nonischemic cardiomyopathy ([place])    a. 08/2014: cath showing minimal CAD, but tortuous arteries noted.   . Personal history of DVT (deep vein thrombosis)/ PE 05/26/2016   In Oct 2015 had small subsemental LUL PE w/o DVT (LE dopplers neg) and was felt to be HD cath related, treated w coumadin.  IN May 2016 had small vein DVT (acute/subacute) in the R basilic/ brachial veins of the RUE, resumed on coumadin.  Had R sided HD cath at that time.    . Renal insufficiency   . Shortness of breath   . Type II diabetes mellitus (HCC)    No history per patient, but remains under history as A1c would not be accurate given on dialysis    MEDICATIONS:  Prior to Admission medications   Medication Sig Start Date End Date Taking? Authorizing Provider  allopurinol (ZYLOPRIM) 100 MG tablet Take 100 mg by mouth daily.    Historical Provider, MD  amLODipine (NORVASC) 10 MG tablet Take 10 mg by mouth 2 (two) times daily.  Historical Provider, MD  atorvastatin (LIPITOR) 40 MG tablet Take 1 tablet (40 mg total) by mouth daily at 6 PM. 09/02/14   Barton Dubois, MD  carvedilol (COREG) 25 MG tablet Take 25 mg by mouth 2 (two) times daily with a meal.    Historical Provider, MD  cinacalcet (SENSIPAR) 30 MG tablet Take 30 mg by mouth every evening.     Historical Provider, MD  cloNIDine (CATAPRES - DOSED IN MG/24 HR) 0.3 mg/24hr patch Place 0.3 mg onto the skin once a week.    Historical Provider, MD  doxazosin (CARDURA) 2 MG tablet Take 2 mg by mouth daily.    Historical Provider, MD  famotidine (PEPCID) 20 MG tablet Take 1 tablet (20 mg total) by  mouth 2 (two) times daily. 02/13/16   Charlesetta Shanks, MD  gabapentin (NEURONTIN) 100 MG capsule Take 1 capsule (100 mg total) by mouth at bedtime. Patient taking differently: Take 100 mg by mouth 3 (three) times daily.  05/21/16   [name], DO  hydrALAZINE (APRESOLINE) 100 MG tablet Take 100 mg by mouth 2 (two) times daily.     Historical Provider, MD  isosorbide mononitrate (IMDUR) 60 MG 24 hr tablet Take 1 tablet (60 mg total) by mouth daily. 01/09/16   Smiley Houseman, MD  omeprazole (PRILOSEC) 20 MG capsule Take 20 mg by mouth daily. 07/01/13   Historical Provider, MD  ondansetron (ZOFRAN ODT) 4 MG disintegrating tablet Take 1 tablet (4 mg total) by mouth every 8 (eight) hours as needed for nausea or vomiting. 05/16/16   Sherwood Gambler, MD  oxycodone (OXY-IR) 5 MG capsule Take 1 capsule (5 mg total) by mouth every 4 (four) hours as needed for pain. 05/30/16   [name], MD  oxyCODONE-acetaminophen (PERCOCET/ROXICET) 5-325 MG tablet Take 1 tablet by mouth every 6 (six) hours as needed for severe pain.    Historical Provider, MD  predniSONE (DELTASONE) 10 MG tablet Take 10 mg by mouth daily with breakfast.    Historical Provider, MD  sevelamer carbonate (RENVELA) 800 MG tablet Take 1,600 mg by mouth 3 (three) times daily with meals.     Historical Provider, MD  warfarin (COUMADIN) 2 MG tablet Take 2 mg by mouth daily. Take with 60m tablet to equal 754m7/12/17   Historical Provider, MD  warfarin (COUMADIN) 5 MG tablet Take 5 mg by mouth See admin instructions. Take 1 tablet (5 mg) by mouth with a 2 mg tablet for a 7 mg dose 01/31/16   Historical Provider, MD    ALLERGIES:  Allergies  Allergen Reactions  . Butalbital-Apap-Caffeine Shortness Of Breath, Swelling and Other (See Comments)    Swelling in throat  . Ferrlecit [Na Ferric Gluc Cplx In Sucrose] Shortness Of Breath, Swelling and Other (See Comments)    Swelling in throat  . Minoxidil Shortness Of Breath  . Tylenol  [Acetaminophen] Anaphylaxis and Swelling  . Darvocet [Propoxyphene N-Acetaminophen] Hives    SOCIAL HISTORY:  Social History  Substance Use Topics  . Smoking status: Former Smoker    Packs/day: 0.00    Years: 1.00    Types: Cigarettes  . Smokeless tobacco: Never Used     Comment: quit Jan 2014  . Alcohol use No    FAMILY HISTORY: Family History  Problem Relation Age of Onset  . Hypertension Other     EXAM: BP (!) 191/111 (BP Location: Right Arm)   Pulse 66   Temp 98.8 F (37.1 C) (Oral)   Resp  16   SpO2 98%  CONSTITUTIONAL: Alert and oriented and responds appropriately to questions. Chronically ill-appearing; well-nourished HEAD: Normocephalic EYES: Conjunctivae clear, PERRL, EOMI ENT: normal nose; no rhinorrhea; moist mucous membranes NECK: Supple, no meningismus, no nuchal rigidity, no LAD; no JVD  CARD: RRR; S1 and S2 appreciated; no murmurs, no clicks, no rubs, no gallops RESP: Normal chest excursion without splinting or tachypnea; breath sounds clear and equal bilaterally; no wheezes, no rhonchi, no rales, no hypoxia or respiratory distress, speaking full sentences ABD/GI: Normal bowel sounds; non-distended; soft, non-tender, no rebound, no guarding, no peritoneal signs, no hepatosplenomegaly BACK:  The back appears normal and is non-tender to palpation, there is no CVA tenderness EXT: Normal ROM in all joints; non-tender to palpation; no edema; normal capillary refill; no cyanosis, no calf tenderness or swelling    SKIN: Normal color for age and race; warm; no rash; dialysis catheter in right chest NEURO: Moves all extremities equally, sensation to light touch intact diffusely, cranial nerves II through XII intact, normal speech PSYCH: The patient's mood and manner are appropriate. Grooming and personal hygiene are appropriate.  MEDICAL DECISION MAKING: Patient here requesting nonemergent dialysis. Labs drawn earlier this morning show potassium of 5.2. He is mildly  hypertensive but does not appear volume overloaded. I do not feel he needs emergent dialysis. We'll discuss with nephrologist on call.   I do not feel labs need to be repeated at this time.  ED PROGRESS: 12:16 AM  D/w Dr. Marval Regal on call for nephrology who was consulted yesterday morning when patient was here for the same. He will contact dialysis and call me back. We appreciate his help.  12:20 AM  D/w Dr. Marval Regal again.  He has contacted dialysis nurse. Unable to perform dialysis at this time. Patient does not have any emergent needs. BP has improved without intervention.  They recommended he come back in the morning at 10 AM to see if dialysis can be performed at that time. He does state that this is not a guarantee given it is the weekend, limited staff, poor weather. Discussed this with patient. Discussed return precautions.   At this time, I do not feel there is any life-threatening condition present. I have reviewed and discussed all results (EKG, imaging, lab, urine as appropriate) and exam findings with patient/family. I have reviewed nursing notes and appropriate previous records.  I feel the patient is safe to be discharged home without further emergent workup and can continue workup as an outpatient as needed. Discussed usual and customary return precautions. Patient/family verbalize understanding and are comfortable with this plan.  Outpatient follow-up has been provided. All questions have been answered.        I personally performed the services described in this documentation, which was scribed in my presence. The recorded information has been reviewed and is accurate.      [place], DO 06/30/16 [phone]

## 2016-06-30 NOTE — ED Provider Notes (Signed)
[facility] DEPT Provider Note   CSN: [id number] Arrival date & time: 06/28/16  0606     History   Chief Complaint Chief Complaint  Patient presents with  . Vascular Access Problem    HPI Frank [name] is a 50 y.o. male.  HPI Patient is here for dialysis. The patient has no complaints. Patient is requesting breakfast. No SOB, or chest pain   Past Medical History:  Diagnosis Date  . Anemia   . Anxiety   . Chronic combined systolic and diastolic CHF (congestive heart failure) (HCC)    a. EF 20-25% by echo in 08/2015 b. echo 10/2015: EF 35-40%, diffuse HK, severe LAE, moderate RAE, small pericardial effusion  . Complication of anesthesia    itching, sore throat  . Depression   . Dialysis patient ([place])   . ESRD (end stage renal disease) ([place])    due to HTN per patient, followed at [facility], s/p failed kidney transplant - dialysis Tue, Th, Sat  . Hyperkalemia 12/2015  . Hypertension   . Junctional rhythm    a. noted in 08/2015: hyperkalemic at that time  b. 12/2015: presented in junctional rhythm w/ K+ of 6.6. Resolved with improvement of K+ levels.  . Nonischemic cardiomyopathy ([place])    a. 08/2014: cath showing minimal CAD, but tortuous arteries noted.   . Personal history of DVT (deep vein thrombosis)/ PE 05/26/2016   In Oct 2015 had small subsemental LUL PE w/o DVT (LE dopplers neg) and was felt to be HD cath related, treated w coumadin.  IN May 2016 had small vein DVT (acute/subacute) in the R basilic/ brachial veins of the RUE, resumed on coumadin.  Had R sided HD cath at that time.    . Renal insufficiency   . Shortness of breath   . Type II diabetes mellitus (HCC)    No history per patient, but remains under history as A1c would not be accurate given on dialysis    Patient Active Problem List   Diagnosis Date Noted  . Anemia associated with chronic renal failure   . End-stage renal disease on hemodialysis ([place])   . Subtherapeutic international normalized ratio (INR)    . Hypertension   . Dialysis patient, noncompliant ([place]) 06/22/2016  . ESRD (end stage renal disease) on dialysis ([place])   . ESRD (end stage renal disease) ([place]) 06/20/2016  . ESRD needing dialysis ([place]) 06/14/2016  . Uremia 06/07/2016  . Hyperkalemia 05/29/2016  . Chronic back pain 05/29/2016  . GERD (gastroesophageal reflux disease) 05/29/2016  . Personal history of DVT (deep vein thrombosis)/ PE 05/26/2016  . Bradycardia 03/31/2016  . Nonischemic cardiomyopathy ([place]) 01/09/2016  . Bilateral low back pain without sciatica   . Left renal mass 10/30/2015  . Constipation 10/30/2015  . Adjustment disorder with mixed anxiety and depressed mood 08/20/2015  . Chronic epididymitis 06/19/2015  . Groin pain, chronic, right 06/19/2015  . Incarcerated right inguinal hernia 02/16/2015  . Essential hypertension 01/02/2015  . Dyslipidemia   . ESRD on hemodialysis ([place])   . Anemia of chronic kidney failure 06/24/2013    Past Surgical History:  Procedure Laterality Date  . CAPD INSERTION    . CAPD REMOVAL    . INGUINAL HERNIA REPAIR Right 02/14/2015   Procedure: REPAIR INCARCERATED RIGHT INGUINAL HERNIA;  Surgeon: [name], MD;  Location: [facility];  Service: General;  Laterality: Right;  . INSERTION OF DIALYSIS CATHETER Right 09/23/2015   Procedure: exchange of Right internal Dialysis Catheter.;  Surgeon: Butch Penny  Trula Slade, MD;  Location: [facility];  Service: Vascular;  Laterality: Right;  . KIDNEY RECEIPIENT  2006   failed and started HD in March 2014  . LEFT HEART CATHETERIZATION WITH CORONARY ANGIOGRAM N/A 09/02/2014   Procedure: LEFT HEART CATHETERIZATION WITH CORONARY ANGIOGRAM;  Surgeon: [name], MD;  Location: [facility] C[facility] LAB;  Service: Cardiovascular;  Laterality: N/A;       Home Medications    Prior to Admission medications   Medication Sig Start Date End Date Taking? Authorizing Provider  allopurinol (ZYLOPRIM) 100 MG tablet Take 100 mg by mouth daily.    Historical Provider, MD    amLODipine (NORVASC) 10 MG tablet Take 10 mg by mouth 2 (two) times daily.     Historical Provider, MD  atorvastatin (LIPITOR) 40 MG tablet Take 1 tablet (40 mg total) by mouth daily at 6 PM. 09/02/14   Barton Dubois, MD  carvedilol (COREG) 25 MG tablet Take 25 mg by mouth 2 (two) times daily with a meal.    Historical Provider, MD  cinacalcet (SENSIPAR) 30 MG tablet Take 30 mg by mouth every evening.     Historical Provider, MD  cloNIDine (CATAPRES - DOSED IN MG/24 HR) 0.3 mg/24hr patch Place 0.3 mg onto the skin once a week.    Historical Provider, MD  doxazosin (CARDURA) 2 MG tablet Take 2 mg by mouth daily.    Historical Provider, MD  famotidine (PEPCID) 20 MG tablet Take 1 tablet (20 mg total) by mouth 2 (two) times daily. 02/13/16   Charlesetta Shanks, MD  gabapentin (NEURONTIN) 100 MG capsule Take 1 capsule (100 mg total) by mouth at bedtime. Patient taking differently: Take 100 mg by mouth 3 (three) times daily.  05/21/16   [name], DO  hydrALAZINE (APRESOLINE) 100 MG tablet Take 100 mg by mouth 2 (two) times daily.     Historical Provider, MD  isosorbide mononitrate (IMDUR) 60 MG 24 hr tablet Take 1 tablet (60 mg total) by mouth daily. 01/09/16   Smiley Houseman, MD  omeprazole (PRILOSEC) 20 MG capsule Take 20 mg by mouth daily. 07/01/13   Historical Provider, MD  ondansetron (ZOFRAN ODT) 4 MG disintegrating tablet Take 1 tablet (4 mg total) by mouth every 8 (eight) hours as needed for nausea or vomiting. 05/16/16   Sherwood Gambler, MD  oxycodone (OXY-IR) 5 MG capsule Take 1 capsule (5 mg total) by mouth every 4 (four) hours as needed for pain. 05/30/16   [name], MD  oxyCODONE-acetaminophen (PERCOCET/ROXICET) 5-325 MG tablet Take 1 tablet by mouth every 6 (six) hours as needed for severe pain.    Historical Provider, MD  predniSONE (DELTASONE) 10 MG tablet Take 10 mg by mouth daily with breakfast.    Historical Provider, MD  sevelamer carbonate (RENVELA) 800 MG tablet Take 1,600  mg by mouth 3 (three) times daily with meals.     Historical Provider, MD  warfarin (COUMADIN) 2 MG tablet Take 2 mg by mouth daily. Take with 74m tablet to equal 770m7/12/17   Historical Provider, MD  warfarin (COUMADIN) 5 MG tablet Take 5 mg by mouth See admin instructions. Take 1 tablet (5 mg) by mouth with a 2 mg tablet for a 7 mg dose 01/31/16   Historical Provider, MD    Family History Family History  Problem Relation Age of Onset  . Hypertension Other     Social History Social History  Substance Use Topics  . Smoking status: Former Smoker  Packs/day: 0.00    Years: 1.00    Types: Cigarettes  . Smokeless tobacco: Never Used     Comment: quit Jan 2014  . Alcohol use No     Allergies   Butalbital-apap-caffeine; Ferrlecit [na ferric gluc cplx in sucrose]; Minoxidil; Tylenol [acetaminophen]; and Darvocet [propoxyphene n-acetaminophen]   Review of Systems Review of Systems All other systems negative except as documented in the HPI. All pertinent positives and negatives as reviewed in the HPI.   Physical Exam Updated Vital Signs BP 142/94   Pulse 66   Temp 98.1 F (36.7 C) (Oral)   Resp 18   Wt 73.9 kg   SpO2 99%   BMI 20.93 kg/m   Physical Exam  Constitutional: He is oriented to person, place, and time. He appears well-developed and well-nourished. No distress.  HENT:  Head: Normocephalic and atraumatic.  Eyes: Pupils are equal, round, and reactive to light.  Neck: Normal range of motion. Neck supple.  Pulmonary/Chest: Effort normal and breath sounds normal.  Neurological: He is alert and oriented to person, place, and time.  Skin: Skin is warm and dry.  Psychiatric: He has a normal mood and affect.  Nursing note and vitals reviewed.    ED Treatments / Results  Labs (all labs ordered are listed, but only abnormal results are displayed) Labs Reviewed  I-STAT CHEM 8, ED - Abnormal; Notable for the following:       Result Value   Potassium 5.2 (*)     BUN 41 (*)    Creatinine, Ser 9.90 (*)    Calcium, Ion 1.00 (*)    All other components within normal limits    EKG  EKG Interpretation None       Radiology No results found.  Procedures Procedures (including critical care time)  Medications Ordered in ED Medications - No data to display   Initial Impression / Assessment and Plan / ED Course  I have reviewed the triage vital signs and the nursing notes.  Pertinent labs & imaging results that were available during my care of the patient were reviewed by me and considered in my medical decision making (see chart for details).  Clinical Course     The patient has left because we did not get him food fast enough. The patient has checked back into the ER in the time I am writing this note.  Final Clinical Impressions(s) / ED Diagnoses   Final diagnoses:  None    New Prescriptions Discharge Medication List as of 06/28/2016  7:07 AM       Dalia Heading, PA-C 06/30/16 0825    Sharlett Iles, MD 07/05/16 [phone]

## 2016-06-30 NOTE — ED Triage Notes (Signed)
Pt here to receive dialysis

## 2016-06-30 NOTE — ED Triage Notes (Signed)
Last dialysis was Tuesday

## 2016-06-30 NOTE — Discharge Instructions (Signed)
We discussed your case with Dr. Marval Regal who is a nephrologist on call. At this time they are unable to perform dialysis tonight. They recommended you return at 10 AM in the morning to see if dialysis can be performed at that time.

## 2016-07-01 ENCOUNTER — Encounter (HOSPITAL_COMMUNITY): Payer: Self-pay

## 2016-07-01 DIAGNOSIS — N186 End stage renal disease: Secondary | ICD-10-CM | POA: Diagnosis not present

## 2016-07-01 DIAGNOSIS — I132 Hypertensive heart and chronic kidney disease with heart failure and with stage 5 chronic kidney disease, or end stage renal disease: Secondary | ICD-10-CM | POA: Insufficient documentation

## 2016-07-01 DIAGNOSIS — E1122 Type 2 diabetes mellitus with diabetic chronic kidney disease: Secondary | ICD-10-CM | POA: Diagnosis not present

## 2016-07-01 DIAGNOSIS — Z87891 Personal history of nicotine dependence: Secondary | ICD-10-CM | POA: Insufficient documentation

## 2016-07-01 DIAGNOSIS — I5042 Chronic combined systolic (congestive) and diastolic (congestive) heart failure: Secondary | ICD-10-CM | POA: Diagnosis not present

## 2016-07-01 DIAGNOSIS — Z992 Dependence on renal dialysis: Secondary | ICD-10-CM | POA: Insufficient documentation

## 2016-07-01 DIAGNOSIS — M545 Low back pain: Secondary | ICD-10-CM | POA: Insufficient documentation

## 2016-07-01 DIAGNOSIS — Z7901 Long term (current) use of anticoagulants: Secondary | ICD-10-CM | POA: Insufficient documentation

## 2016-07-01 DIAGNOSIS — I120 Hypertensive chronic kidney disease with stage 5 chronic kidney disease or end stage renal disease: Secondary | ICD-10-CM | POA: Diagnosis not present

## 2016-07-01 NOTE — ED Notes (Signed)
Rounded on patient, provided update

## 2016-07-01 NOTE — ED Triage Notes (Signed)
Pt here for dialysis, pt was here yesterday and had full dialysis treatment and was told to come back today. Pt also endorses chronic back pain

## 2016-07-02 ENCOUNTER — Emergency Department (HOSPITAL_COMMUNITY)
Admission: EM | Admit: 2016-07-02 | Discharge: 2016-07-02 | Disposition: A | Discharge status: Home / Self Care | Payer: Medicare Other | Attending: Emergency Medicine | Admitting: Emergency Medicine

## 2016-07-02 DIAGNOSIS — D631 Anemia in chronic kidney disease: Secondary | ICD-10-CM | POA: Diagnosis not present

## 2016-07-02 DIAGNOSIS — E875 Hyperkalemia: Secondary | ICD-10-CM | POA: Diagnosis not present

## 2016-07-02 DIAGNOSIS — Z992 Dependence on renal dialysis: Secondary | ICD-10-CM | POA: Diagnosis not present

## 2016-07-02 DIAGNOSIS — N186 End stage renal disease: Secondary | ICD-10-CM | POA: Diagnosis not present

## 2016-07-02 DIAGNOSIS — I120 Hypertensive chronic kidney disease with stage 5 chronic kidney disease or end stage renal disease: Secondary | ICD-10-CM | POA: Diagnosis not present

## 2016-07-02 LAB — CBC
HEMATOCRIT: 32.6 % — AB (ref 39.0–52.0)
HEMOGLOBIN: 10.3 g/dL — AB (ref 13.0–17.0)
MCH: 27.7 pg (ref 26.0–34.0)
MCHC: 31.6 g/dL (ref 30.0–36.0)
MCV: 87.6 fL (ref 78.0–100.0)
Platelets: 185 10*3/uL (ref 150–400)
RBC: 3.72 MIL/uL — AB (ref 4.22–5.81)
RDW: 15.9 % — ABNORMAL HIGH (ref 11.5–15.5)
WBC: 5.3 10*3/uL (ref 4.0–10.5)

## 2016-07-02 LAB — RENAL FUNCTION PANEL
ANION GAP: 13 (ref 5–15)
Albumin: 2.9 g/dL — ABNORMAL LOW (ref 3.5–5.0)
BUN: 48 mg/dL — ABNORMAL HIGH (ref 6–20)
CHLORIDE: 98 mmol/L — AB (ref 101–111)
CO2: 26 mmol/L (ref 22–32)
Calcium: 8 mg/dL — ABNORMAL LOW (ref 8.9–10.3)
Creatinine, Ser: 12.42 mg/dL — ABNORMAL HIGH (ref 0.61–1.24)
GFR calc non Af Amer: 4 mL/min — ABNORMAL LOW (ref >60)
GFR, EST AFRICAN AMERICAN: 5 mL/min — AB (ref >60)
GLUCOSE: 84 mg/dL (ref 65–99)
POTASSIUM: 5.6 mmol/L — AB (ref 3.5–5.1)
Phosphorus: 7.3 mg/dL — ABNORMAL HIGH (ref 2.5–4.6)
Sodium: 137 mmol/L (ref 135–145)

## 2016-07-02 MED ORDER — SODIUM CHLORIDE 0.9 % IV SOLN: 100.0000 mL | INTRAVENOUS | Status: DC | PRN

## 2016-07-02 MED ORDER — LIDOCAINE-PRILOCAINE 2.5-2.5 % EX CREA
1.0000 "application " | TOPICAL_CREAM | CUTANEOUS | Status: DC | PRN
  Filled 2016-07-02: qty 5

## 2016-07-02 MED ORDER — PENTAFLUOROPROP-TETRAFLUOROETH EX AERO
1.0000 "application " | INHALATION_SPRAY | CUTANEOUS | Status: DC | PRN
  Filled 2016-07-02: qty 30

## 2016-07-02 MED ORDER — LIDOCAINE HCL (PF) 1 % IJ SOLN: 5.0000 mL | INTRAMUSCULAR | Status: DC | PRN

## 2016-07-02 MED ORDER — ALTEPLASE 2 MG IJ SOLR: 2.0000 mg | Freq: Once | INTRAMUSCULAR | Status: DC | PRN

## 2016-07-02 MED ORDER — HEPARIN SODIUM (PORCINE) 1000 UNIT/ML DIALYSIS
1000.0000 [IU] | INTRAMUSCULAR | Status: DC | PRN
  Filled 2016-07-02: qty 1

## 2016-07-02 NOTE — Procedures (Signed)
Increased goal to 4 kg, pt says he is up 13kg.  NO resp distress, faint rales at lung bases, asleep on HD.  Ok for [employer] after HD.    I was present at this dialysis session, have reviewed the session itself and made  appropriate changes Kelly Splinter MD [place] pager [phone]   07/02/2016, 12:49 PM

## 2016-07-02 NOTE — ED Notes (Signed)
Called Dialysis. Patient to be called for in 1-1.5 hours when dialysis is ready for him.

## 2016-07-02 NOTE — ED Provider Notes (Signed)
[facility] DEPT Provider Note   CSN: [id number] Arrival date & time: 07/01/16  2018  By signing my name below, I, Ephriam Jenkins, attest that this documentation has been prepared under the direction and in the presence of Ripley Fraise, MD. Electronically signed, Ephriam Jenkins, ED Scribe. 07/02/16. 12:49 AM.   History   Chief Complaint Chief Complaint  Patient presents with  . Vascular Access Problem    HPI HPI Comments: Frank [name] is a 50 y.o. male, with Hx of ESRD, DM, HTN, who presents to the Emergency Department requesting dialysis. Pt was here yesterday and had a short dialysis treatment but was told to come back today. He has felt mildly short of breath today but does not complain of any further symptoms besides some acute on chronic lower back pain. No OTC meds taken prior to arrival. No nausea, vomiting, cough, chest pain or syncopal episodes.  The history is provided by the patient. No language interpreter was used.    Past Medical History:  Diagnosis Date  . Anemia   . Anxiety   . Chronic combined systolic and diastolic CHF (congestive heart failure) (HCC)    a. EF 20-25% by echo in 08/2015 b. echo 10/2015: EF 35-40%, diffuse HK, severe LAE, moderate RAE, small pericardial effusion  . Complication of anesthesia    itching, sore throat  . Depression   . Dialysis patient ([place])   . ESRD (end stage renal disease) ([place])    due to HTN per patient, followed at [facility], s/p failed kidney transplant - dialysis Tue, Th, Sat  . Hyperkalemia 12/2015  . Hypertension   . Junctional rhythm    a. noted in 08/2015: hyperkalemic at that time  b. 12/2015: presented in junctional rhythm w/ K+ of 6.6. Resolved with improvement of K+ levels.  . Nonischemic cardiomyopathy ([place])    a. 08/2014: cath showing minimal CAD, but tortuous arteries noted.   . Personal history of DVT (deep vein thrombosis)/ PE 05/26/2016   In Oct 2015 had small subsemental LUL PE w/o DVT (LE dopplers neg) and was  felt to be HD cath related, treated w coumadin.  IN May 2016 had small vein DVT (acute/subacute) in the R basilic/ brachial veins of the RUE, resumed on coumadin.  Had R sided HD cath at that time.    . Renal insufficiency   . Shortness of breath   . Type II diabetes mellitus (HCC)    No history per patient, but remains under history as A1c would not be accurate given on dialysis    Patient Active Problem List   Diagnosis Date Noted  . Anemia associated with chronic renal failure   . End-stage renal disease on hemodialysis ([place])   . Subtherapeutic international normalized ratio (INR)   . Hypertension   . Dialysis patient, noncompliant ([place]) 06/22/2016  . ESRD (end stage renal disease) on dialysis ([place])   . ESRD (end stage renal disease) ([place]) 06/20/2016  . ESRD needing dialysis ([place]) 06/14/2016  . Uremia 06/07/2016  . Hyperkalemia 05/29/2016  . Chronic back pain 05/29/2016  . GERD (gastroesophageal reflux disease) 05/29/2016  . Personal history of DVT (deep vein thrombosis)/ PE 05/26/2016  . Bradycardia 03/31/2016  . Nonischemic cardiomyopathy ([place]) 01/09/2016  . Bilateral low back pain without sciatica   . Left renal mass 10/30/2015  . Constipation 10/30/2015  . Adjustment disorder with mixed anxiety and depressed mood 08/20/2015  . Chronic epididymitis 06/19/2015  . Groin pain, chronic, right 06/19/2015  . Incarcerated right  inguinal hernia 02/16/2015  . Essential hypertension 01/02/2015  . Dyslipidemia   . ESRD on hemodialysis ([place])   . Anemia of chronic kidney failure 06/24/2013    Past Surgical History:  Procedure Laterality Date  . CAPD INSERTION    . CAPD REMOVAL    . INGUINAL HERNIA REPAIR Right 02/14/2015   Procedure: REPAIR INCARCERATED RIGHT INGUINAL HERNIA;  Surgeon: [name], MD;  Location: [facility];  Service: General;  Laterality: Right;  . INSERTION OF DIALYSIS CATHETER Right 09/23/2015   Procedure: exchange of Right internal Dialysis Catheter.;  Surgeon: [name], MD;  Location: [facility];  Service: Vascular;  Laterality: Right;  . KIDNEY RECEIPIENT  2006   failed and started HD in March 2014  . LEFT HEART CATHETERIZATION WITH CORONARY ANGIOGRAM N/A 09/02/2014   Procedure: LEFT HEART CATHETERIZATION WITH CORONARY ANGIOGRAM;  Surgeon: [name], MD;  Location: [facility] CATH LAB;  Service: Cardiovascular;  Laterality: N/A;       Home Medications    Prior to Admission medications   Medication Sig Start Date End Date Taking? Authorizing Provider  allopurinol (ZYLOPRIM) 100 MG tablet Take 100 mg by mouth daily.    Historical Provider, MD  amLODipine (NORVASC) 10 MG tablet Take 10 mg by mouth 2 (two) times daily.     Historical Provider, MD  atorvastatin (LIPITOR) 40 MG tablet Take 1 tablet (40 mg total) by mouth daily at 6 PM. 09/02/14   Barton Dubois, MD  carvedilol (COREG) 25 MG tablet Take 25 mg by mouth 2 (two) times daily with a meal.    Historical Provider, MD  cinacalcet (SENSIPAR) 30 MG tablet Take 30 mg by mouth every evening.     Historical Provider, MD  cloNIDine (CATAPRES - DOSED IN MG/24 HR) 0.3 mg/24hr patch Place 0.3 mg onto the skin once a week.    Historical Provider, MD  doxazosin (CARDURA) 2 MG tablet Take 2 mg by mouth daily.    Historical Provider, MD  famotidine (PEPCID) 20 MG tablet Take 1 tablet (20 mg total) by mouth 2 (two) times daily. 02/13/16   Charlesetta Shanks, MD  gabapentin (NEURONTIN) 100 MG capsule Take 1 capsule (100 mg total) by mouth at bedtime. Patient taking differently: Take 100 mg by mouth 3 (three) times daily.  05/21/16   [name], DO  hydrALAZINE (APRESOLINE) 100 MG tablet Take 100 mg by mouth 2 (two) times daily.     Historical Provider, MD  isosorbide mononitrate (IMDUR) 60 MG 24 hr tablet Take 1 tablet (60 mg total) by mouth daily. 01/09/16   Smiley Houseman, MD  omeprazole (PRILOSEC) 20 MG capsule Take 20 mg by mouth daily. 07/01/13   Historical Provider, MD  ondansetron (ZOFRAN ODT) 4 MG  disintegrating tablet Take 1 tablet (4 mg total) by mouth every 8 (eight) hours as needed for nausea or vomiting. 05/16/16   Sherwood Gambler, MD  oxycodone (OXY-IR) 5 MG capsule Take 1 capsule (5 mg total) by mouth every 4 (four) hours as needed for pain. 05/30/16   [name], MD  oxyCODONE-acetaminophen (PERCOCET/ROXICET) 5-325 MG tablet Take 1 tablet by mouth every 6 (six) hours as needed for severe pain.    Historical Provider, MD  predniSONE (DELTASONE) 10 MG tablet Take 10 mg by mouth daily with breakfast.    Historical Provider, MD  sevelamer carbonate (RENVELA) 800 MG tablet Take 1,600 mg by mouth 3 (three) times daily with meals.     Historical Provider, MD  warfarin (COUMADIN) 2 MG tablet Take 2 mg by mouth daily. Take with 61m tablet to equal 732m7/12/17   Historical Provider, MD  warfarin (COUMADIN) 5 MG tablet Take 5 mg by mouth See admin instructions. Take 1 tablet (5 mg) by mouth with a 2 mg tablet for a 7 mg dose 01/31/16   Historical Provider, MD    Family History Family History  Problem Relation Age of Onset  . Hypertension Other     Social History Social History  Substance Use Topics  . Smoking status: Former Smoker    Packs/day: 0.00    Years: 1.00    Types: Cigarettes  . Smokeless tobacco: Never Used     Comment: quit Jan 2014  . Alcohol use No     Allergies   Butalbital-apap-caffeine; Ferrlecit [na ferric gluc cplx in sucrose]; Minoxidil; Tylenol [acetaminophen]; and Darvocet [propoxyphene n-acetaminophen]   Review of Systems Review of Systems  Constitutional: Negative for fever.  Respiratory: Positive for shortness of breath. Negative for cough.   Cardiovascular: Negative for chest pain.  Gastrointestinal: Negative for nausea and vomiting.  Genitourinary: Negative for dysuria.  All other systems reviewed and are negative.    Physical Exam Updated Vital Signs BP (!) 164/126 (BP Location: Right Arm)   Pulse 66   Temp 98.3 F (36.8 C) (Oral)   Resp  16   Ht _0  (1.88 m)   Wt 167 lb (75.8 kg)   SpO2 100%   BMI 21.44 kg/m   Physical Exam CONSTITUTIONAL: Well developed/well nourished HEAD: Normocephalic/atraumatic EYES: EOMI ENMT: Mucous membranes moist NECK: supple no meningeal signs SPINE/BACK:mild lower lumbar tenderness CV: S1/S2 noted LUNGS: Lungs are clear to auscultation bilaterally, no apparent distress ABDOMEN: soft, nontender  NEURO: Pt is awake/alert/appropriate, moves all extremitiesx4.  No facial droop.   EXTREMITIES: pulses normal/equal, full ROM SKIN: warm, color normal, dialysis catheter to right chest PSYCH: no abnormalities of mood noted, alert and oriented to situation   ED Treatments / Results  DIAGNOSTIC STUDIES: Oxygen Saturation is 100% on RA, normal by my interpretation.  COORDINATION OF CARE: 12:45 AM-Discussed treatment plan with pt at bedside and pt agreed to plan.   Labs (all labs ordered are listed, but only abnormal results are displayed) Labs Reviewed - No data to display  EKG  EKG Interpretation None       Radiology No results found.  Procedures Procedures (including critical care time)  Medications Ordered in ED Medications - No data to display   Initial Impression / Assessment and Plan / ED Course  I have reviewed the triage vital signs and the nursing notes.    Clinical Course     1:15 AM Pt stable Watching TV, resting comfortably D/w dr weJustin Mendith nephro He requests to hold patient until 6am and then call dialysis floor 6:53 AM Pt stable He will go to dialysis  Final Clinical Impressions(s) / ED Diagnoses   Final diagnoses:  ESRD (end stage renal disease) ([place] I personally performed the services described in this documentation, which was scribed in my presence. The recorded information has been reviewed and is accurate.    New Prescriptions New Prescriptions   No medications on file  I personally performed the services described in this  documentation, which was scribed in my presence. The recorded information has been reviewed and is accurate.       DoRipley FraiseMD 07/02/16 [phone]

## 2016-07-02 NOTE — Progress Notes (Signed)
Patient finished with HD treatment. Bp 127/96,p98,r18,t96.9,wt=73.1kg. With 4.0L removed. Patient was cannulated times two with 16 g needles w/o problems. Deacessed with 5 minute bleeding time per stick. Bruit and Thrill present post treatment. Patient discharged from Hemodialysis. Patient alert and oriented x 4. Denies pain, SOB, dizziness, or weakness.

## 2016-07-03 DIAGNOSIS — R1032 Left lower quadrant pain: Secondary | ICD-10-CM | POA: Diagnosis not present

## 2016-07-03 DIAGNOSIS — R1030 Lower abdominal pain, unspecified: Secondary | ICD-10-CM | POA: Diagnosis not present

## 2016-07-03 DIAGNOSIS — Z885 Allergy status to narcotic agent status: Secondary | ICD-10-CM | POA: Diagnosis not present

## 2016-07-03 DIAGNOSIS — Z7901 Long term (current) use of anticoagulants: Secondary | ICD-10-CM | POA: Diagnosis not present

## 2016-07-03 DIAGNOSIS — M545 Low back pain: Secondary | ICD-10-CM | POA: Diagnosis not present

## 2016-07-03 DIAGNOSIS — N186 End stage renal disease: Secondary | ICD-10-CM | POA: Diagnosis not present

## 2016-07-03 DIAGNOSIS — Z888 Allergy status to other drugs, medicaments and biological substances status: Secondary | ICD-10-CM | POA: Diagnosis not present

## 2016-07-03 DIAGNOSIS — Z886 Allergy status to analgesic agent status: Secondary | ICD-10-CM | POA: Diagnosis not present

## 2016-07-03 DIAGNOSIS — R110 Nausea: Secondary | ICD-10-CM | POA: Diagnosis not present

## 2016-07-03 DIAGNOSIS — I120 Hypertensive chronic kidney disease with stage 5 chronic kidney disease or end stage renal disease: Secondary | ICD-10-CM | POA: Diagnosis not present

## 2016-07-03 DIAGNOSIS — Z79899 Other long term (current) drug therapy: Secondary | ICD-10-CM | POA: Diagnosis not present

## 2016-07-03 DIAGNOSIS — S39012A Strain of muscle, fascia and tendon of lower back, initial encounter: Secondary | ICD-10-CM | POA: Diagnosis not present

## 2016-07-03 DIAGNOSIS — Z792 Long term (current) use of antibiotics: Secondary | ICD-10-CM | POA: Diagnosis not present

## 2016-07-04 ENCOUNTER — Emergency Department (HOSPITAL_COMMUNITY)
Admission: EM | Admit: 2016-07-04 | Discharge: 2016-07-05 | Discharge status: Against Medical Advice | Payer: Medicare Other | Source: Home / Self Care | Attending: Emergency Medicine | Admitting: Emergency Medicine

## 2016-07-04 ENCOUNTER — Encounter (HOSPITAL_COMMUNITY): Payer: Self-pay | Admitting: Emergency Medicine

## 2016-07-04 DIAGNOSIS — Z87891 Personal history of nicotine dependence: Secondary | ICD-10-CM

## 2016-07-04 DIAGNOSIS — E1122 Type 2 diabetes mellitus with diabetic chronic kidney disease: Secondary | ICD-10-CM | POA: Insufficient documentation

## 2016-07-04 DIAGNOSIS — N186 End stage renal disease: Secondary | ICD-10-CM | POA: Insufficient documentation

## 2016-07-04 DIAGNOSIS — Z7901 Long term (current) use of anticoagulants: Secondary | ICD-10-CM

## 2016-07-04 DIAGNOSIS — I132 Hypertensive heart and chronic kidney disease with heart failure and with stage 5 chronic kidney disease, or end stage renal disease: Secondary | ICD-10-CM | POA: Insufficient documentation

## 2016-07-04 DIAGNOSIS — I5042 Chronic combined systolic (congestive) and diastolic (congestive) heart failure: Secondary | ICD-10-CM

## 2016-07-04 DIAGNOSIS — Z992 Dependence on renal dialysis: Secondary | ICD-10-CM

## 2016-07-04 DIAGNOSIS — R112 Nausea with vomiting, unspecified: Secondary | ICD-10-CM

## 2016-07-04 DIAGNOSIS — Z79899 Other long term (current) drug therapy: Secondary | ICD-10-CM | POA: Diagnosis not present

## 2016-07-04 DIAGNOSIS — I120 Hypertensive chronic kidney disease with stage 5 chronic kidney disease or end stage renal disease: Secondary | ICD-10-CM | POA: Diagnosis not present

## 2016-07-04 DIAGNOSIS — E875 Hyperkalemia: Secondary | ICD-10-CM | POA: Diagnosis not present

## 2016-07-04 DIAGNOSIS — Z4901 Encounter for fitting and adjustment of extracorporeal dialysis catheter: Secondary | ICD-10-CM | POA: Diagnosis not present

## 2016-07-04 LAB — CBC WITH DIFFERENTIAL/PLATELET
BASOS PCT: 0 %
Basophils Absolute: 0 10*3/uL (ref 0.0–0.1)
Eosinophils Absolute: 0.4 10*3/uL (ref 0.0–0.7)
Eosinophils Relative: 6 %
HEMATOCRIT: 34.4 % — AB (ref 39.0–52.0)
HEMOGLOBIN: 11 g/dL — AB (ref 13.0–17.0)
LYMPHS PCT: 27 %
Lymphs Abs: 1.5 10*3/uL (ref 0.7–4.0)
MCH: 28 pg (ref 26.0–34.0)
MCHC: 32 g/dL (ref 30.0–36.0)
MCV: 87.5 fL (ref 78.0–100.0)
MONOS PCT: 10 %
Monocytes Absolute: 0.5 10*3/uL (ref 0.1–1.0)
NEUTROS ABS: 3.2 10*3/uL (ref 1.7–7.7)
NEUTROS PCT: 57 %
Platelets: 206 10*3/uL (ref 150–400)
RBC: 3.93 MIL/uL — ABNORMAL LOW (ref 4.22–5.81)
RDW: 16 % — ABNORMAL HIGH (ref 11.5–15.5)
WBC: 5.5 10*3/uL (ref 4.0–10.5)

## 2016-07-04 NOTE — ED Triage Notes (Signed)
Patient is here for his hemodialysis treatment , he added feeling tired/fatigue with emesis today .

## 2016-07-05 ENCOUNTER — Encounter (HOSPITAL_COMMUNITY): Payer: Self-pay | Admitting: Emergency Medicine

## 2016-07-05 ENCOUNTER — Emergency Department (HOSPITAL_COMMUNITY)
Admission: EM | Admit: 2016-07-05 | Discharge: 2016-07-06 | Disposition: A | Discharge status: Home / Self Care | Payer: Medicare Other | Attending: Emergency Medicine | Admitting: Emergency Medicine

## 2016-07-05 DIAGNOSIS — N186 End stage renal disease: Secondary | ICD-10-CM | POA: Insufficient documentation

## 2016-07-05 DIAGNOSIS — Z992 Dependence on renal dialysis: Secondary | ICD-10-CM | POA: Insufficient documentation

## 2016-07-05 DIAGNOSIS — Z79899 Other long term (current) drug therapy: Secondary | ICD-10-CM | POA: Insufficient documentation

## 2016-07-05 DIAGNOSIS — E875 Hyperkalemia: Secondary | ICD-10-CM

## 2016-07-05 DIAGNOSIS — E1122 Type 2 diabetes mellitus with diabetic chronic kidney disease: Secondary | ICD-10-CM | POA: Insufficient documentation

## 2016-07-05 DIAGNOSIS — Z87891 Personal history of nicotine dependence: Secondary | ICD-10-CM | POA: Diagnosis not present

## 2016-07-05 DIAGNOSIS — I132 Hypertensive heart and chronic kidney disease with heart failure and with stage 5 chronic kidney disease, or end stage renal disease: Secondary | ICD-10-CM | POA: Insufficient documentation

## 2016-07-05 DIAGNOSIS — Z7901 Long term (current) use of anticoagulants: Secondary | ICD-10-CM | POA: Insufficient documentation

## 2016-07-05 DIAGNOSIS — I120 Hypertensive chronic kidney disease with stage 5 chronic kidney disease or end stage renal disease: Secondary | ICD-10-CM | POA: Diagnosis not present

## 2016-07-05 DIAGNOSIS — I5042 Chronic combined systolic (congestive) and diastolic (congestive) heart failure: Secondary | ICD-10-CM | POA: Insufficient documentation

## 2016-07-05 LAB — COMPREHENSIVE METABOLIC PANEL
ALBUMIN: 3.4 g/dL — AB (ref 3.5–5.0)
ALT: 11 U/L — AB (ref 17–63)
AST: 24 U/L (ref 15–41)
Alkaline Phosphatase: 184 U/L — ABNORMAL HIGH (ref 38–126)
Anion gap: 13 (ref 5–15)
BUN: 56 mg/dL — AB (ref 6–20)
CHLORIDE: 97 mmol/L — AB (ref 101–111)
CO2: 28 mmol/L (ref 22–32)
CREATININE: 13.8 mg/dL — AB (ref 0.61–1.24)
Calcium: 8.7 mg/dL — ABNORMAL LOW (ref 8.9–10.3)
GFR calc Af Amer: 4 mL/min — ABNORMAL LOW (ref >60)
GFR calc non Af Amer: 4 mL/min — ABNORMAL LOW (ref >60)
Glucose, Bld: 131 mg/dL — ABNORMAL HIGH (ref 65–99)
POTASSIUM: 5.2 mmol/L — AB (ref 3.5–5.1)
SODIUM: 138 mmol/L (ref 135–145)
Total Bilirubin: 0.5 mg/dL (ref 0.3–1.2)
Total Protein: 7.8 g/dL (ref 6.5–8.1)

## 2016-07-05 MED ORDER — ONDANSETRON 4 MG PO TBDP
4.0000 mg | ORAL_TABLET | Freq: Once | ORAL | Status: AC
  Administered 2016-07-05: 4 mg via ORAL
  Filled 2016-07-05: qty 1

## 2016-07-05 NOTE — ED Triage Notes (Addendum)
Pt states he was here this morning for dialysis and was told to return tonight.  Denies any changes.  Last dialysis on Tuesday per pt.

## 2016-07-05 NOTE — ED Notes (Signed)
Ordered breakfast tray (renal)

## 2016-07-05 NOTE — Care Management (Signed)
Patient has had several ED visits in the past 6 months. Patient is  ESRD on HD and at present time does not have a outpatient HD center, so is evaluated in the ED based on symptoms of hemodialysis need and Nephrology consult patient will be dialyzed. [facility] is working on CLIPPING patient to an outpatient HD center. No further ED CM needs identified.

## 2016-07-05 NOTE — ED Notes (Addendum)
Monitor found to be in standby mode. RN explained that monitor needs to remain on. Pt refused 5-lead monitoring.

## 2016-07-05 NOTE — ED Notes (Signed)
RN called HD and they stated it would be tonight before they could get pt upstairs to have dialysis.

## 2016-07-05 NOTE — ED Provider Notes (Signed)
[facility] DEPT Provider Note   CSN: [id number] Arrival date & time: 07/04/16  2254    History   Chief Complaint Chief Complaint  Patient presents with  . Hemodialysis    HPI Frank [name] is a 50 y.o. male.  50 year old male with a history of combined systolic and diastolic CHF, end-stage renal disease on hemodialysis, hypertension, and diabetes mellitus presents to the emergency department for dialysis today. He was last dialyzed on 07/02/2016. He has been receiving dialysis through the emergency department over the last few weeks. He states that he is scheduled to be established at a dialysis center in [place] beginning on Monday. He complains of back pain as well as nausea and uncontrolled vomiting characteristic of his need for dialysis. He reports some paresthesias in his bilateral hands. He states that he has had these symptoms before when requiring dialysis. Last episode of emesis was at 2100. He understands that he will not be dialyzed until the morning. No medications taken prior to arrival.   The history is provided by the patient. No language interpreter was used.    Past Medical History:  Diagnosis Date  . Anemia   . Anxiety   . Chronic combined systolic and diastolic CHF (congestive heart failure) (HCC)    a. EF 20-25% by echo in 08/2015 b. echo 10/2015: EF 35-40%, diffuse HK, severe LAE, moderate RAE, small pericardial effusion  . Complication of anesthesia    itching, sore throat  . Depression   . Dialysis patient ([place])   . ESRD (end stage renal disease) ([place])    due to HTN per patient, followed at [facility], s/p failed kidney transplant - dialysis Tue, Th, Sat  . Hyperkalemia 12/2015  . Hypertension   . Junctional rhythm    a. noted in 08/2015: hyperkalemic at that time  b. 12/2015: presented in junctional rhythm w/ K+ of 6.6. Resolved with improvement of K+ levels.  . Nonischemic cardiomyopathy ([place])    a. 08/2014: cath showing minimal CAD, but tortuous  arteries noted.   . Personal history of DVT (deep vein thrombosis)/ PE 05/26/2016   In Oct 2015 had small subsemental LUL PE w/o DVT (LE dopplers neg) and was felt to be HD cath related, treated w coumadin.  IN May 2016 had small vein DVT (acute/subacute) in the R basilic/ brachial veins of the RUE, resumed on coumadin.  Had R sided HD cath at that time.    . Renal insufficiency   . Shortness of breath   . Type II diabetes mellitus (HCC)    No history per patient, but remains under history as A1c would not be accurate given on dialysis    Patient Active Problem List   Diagnosis Date Noted  . Anemia associated with chronic renal failure   . End-stage renal disease on hemodialysis ([place])   . Subtherapeutic international normalized ratio (INR)   . Hypertension   . Dialysis patient, noncompliant ([place]) 06/22/2016  . ESRD (end stage renal disease) on dialysis ([place])   . ESRD (end stage renal disease) ([place]) 06/20/2016  . ESRD needing dialysis ([place]) 06/14/2016  . Uremia 06/07/2016  . Hyperkalemia 05/29/2016  . Chronic back pain 05/29/2016  . GERD (gastroesophageal reflux disease) 05/29/2016  . Personal history of DVT (deep vein thrombosis)/ PE 05/26/2016  . Bradycardia 03/31/2016  . Nonischemic cardiomyopathy ([place]) 01/09/2016  . Bilateral low back pain without sciatica   . Left renal mass 10/30/2015  . Constipation 10/30/2015  . Adjustment disorder with mixed anxiety  and depressed mood 08/20/2015  . Chronic epididymitis 06/19/2015  . Groin pain, chronic, right 06/19/2015  . Incarcerated right inguinal hernia 02/16/2015  . Essential hypertension 01/02/2015  . Dyslipidemia   . ESRD on hemodialysis ([place])   . Anemia of chronic kidney failure 06/24/2013    Past Surgical History:  Procedure Laterality Date  . CAPD INSERTION    . CAPD REMOVAL    . INGUINAL HERNIA REPAIR Right 02/14/2015   Procedure: REPAIR INCARCERATED RIGHT INGUINAL HERNIA;  Surgeon: [name], MD;  Location: [facility];   Service: General;  Laterality: Right;  . INSERTION OF DIALYSIS CATHETER Right 09/23/2015   Procedure: exchange of Right internal Dialysis Catheter.;  Surgeon: [name], MD;  Location: [facility];  Service: Vascular;  Laterality: Right;  . KIDNEY RECEIPIENT  2006   failed and started HD in March 2014  . LEFT HEART CATHETERIZATION WITH CORONARY ANGIOGRAM N/A 09/02/2014   Procedure: LEFT HEART CATHETERIZATION WITH CORONARY ANGIOGRAM;  Surgeon: [name], MD;  Location: [facility] CATH LAB;  Service: Cardiovascular;  Laterality: N/A;       Home Medications    Prior to Admission medications   Medication Sig Start Date End Date Taking? Authorizing Provider  allopurinol (ZYLOPRIM) 100 MG tablet Take 100 mg by mouth daily.   Yes Historical Provider, MD  amLODipine (NORVASC) 10 MG tablet Take 10 mg by mouth 2 (two) times daily.    Yes Historical Provider, MD  atorvastatin (LIPITOR) 40 MG tablet Take 1 tablet (40 mg total) by mouth daily at 6 PM. 09/02/14  Yes Barton Dubois, MD  carvedilol (COREG) 25 MG tablet Take 25 mg by mouth 2 (two) times daily with a meal.   Yes Historical Provider, MD  cinacalcet (SENSIPAR) 30 MG tablet Take 30 mg by mouth every evening.    Yes Historical Provider, MD  cloNIDine (CATAPRES - DOSED IN MG/24 HR) 0.3 mg/24hr patch Place 0.3 mg onto the skin once a week.   Yes Historical Provider, MD  doxazosin (CARDURA) 2 MG tablet Take 2 mg by mouth daily.   Yes Historical Provider, MD  famotidine (PEPCID) 20 MG tablet Take 1 tablet (20 mg total) by mouth 2 (two) times daily. 02/13/16  Yes Charlesetta Shanks, MD  gabapentin (NEURONTIN) 100 MG capsule Take 1 capsule (100 mg total) by mouth at bedtime. Patient taking differently: Take 100 mg by mouth 3 (three) times daily.  05/21/16  Yes [name], DO  hydrALAZINE (APRESOLINE) 100 MG tablet Take 100 mg by mouth 2 (two) times daily.    Yes Historical Provider, MD  isosorbide mononitrate (IMDUR) 60 MG 24 hr tablet Take 1 tablet (60 mg  total) by mouth daily. 01/09/16  Yes Smiley Houseman, MD  omeprazole (PRILOSEC) 20 MG capsule Take 20 mg by mouth daily. 07/01/13  Yes Historical Provider, MD  ondansetron (ZOFRAN ODT) 4 MG disintegrating tablet Take 1 tablet (4 mg total) by mouth every 8 (eight) hours as needed for nausea or vomiting. 05/16/16  Yes Sherwood Gambler, MD  oxycodone (OXY-IR) 5 MG capsule Take 1 capsule (5 mg total) by mouth every 4 (four) hours as needed for pain. 05/30/16  Yes [name], MD  oxyCODONE-acetaminophen (PERCOCET/ROXICET) 5-325 MG tablet Take 1 tablet by mouth every 6 (six) hours as needed for severe pain.   Yes Historical Provider, MD  predniSONE (DELTASONE) 10 MG tablet Take 10 mg by mouth daily with breakfast.   Yes Historical Provider, MD  sevelamer carbonate (RENVELA) 800  MG tablet Take 1,600 mg by mouth 3 (three) times daily with meals.    Yes Historical Provider, MD  warfarin (COUMADIN) 2 MG tablet Take 2 mg by mouth daily. Take with 21m tablet to equal 723m7/12/17  Yes Historical Provider, MD  warfarin (COUMADIN) 5 MG tablet Take 5 mg by mouth See admin instructions. Take 1 tablet (5 mg) by mouth with a 2 mg tablet for a 7 mg dose 01/31/16  Yes Historical Provider, MD    Family History Family History  Problem Relation Age of Onset  . Hypertension Other     Social History Social History  Substance Use Topics  . Smoking status: Former Smoker    Packs/day: 0.00    Years: 1.00    Types: Cigarettes  . Smokeless tobacco: Never Used     Comment: quit Jan 2014  . Alcohol use No     Allergies   Butalbital-apap-caffeine; Ferrlecit [na ferric gluc cplx in sucrose]; Minoxidil; Tylenol [acetaminophen]; and Darvocet [propoxyphene n-acetaminophen]   Review of Systems Review of Systems Ten systems reviewed and are negative for acute change, except as noted in the HPI.    Physical Exam Updated Vital Signs BP 169/94   Pulse (!) 58   Temp 97.9 F (36.6 C) (Oral)   Resp 19   SpO2 97%     Physical Exam  Constitutional: He is oriented to person, place, and time. He appears well-developed and well-nourished. No distress.  Nontoxic and in NAD  HENT:  Head: Normocephalic and atraumatic.  Eyes: Conjunctivae and EOM are normal. No scleral icterus.  Neck: Normal range of motion.  No JVD  Cardiovascular: Normal rate, regular rhythm and intact distal pulses.   Pulmonary/Chest: Effort normal. No respiratory distress. He has no wheezes.    Lungs CTAB. Chest expansion symmetric.  Musculoskeletal: Normal range of motion.  Neurological: He is alert and oriented to person, place, and time. He exhibits normal muscle tone. Coordination normal.  GCS 15. Patient moving all extremities.  Skin: Skin is warm and dry. No rash noted. He is not diaphoretic. No erythema. No pallor.  Psychiatric: He has a normal mood and affect. His behavior is normal.  Nursing note and vitals reviewed.    ED Treatments / Results  Labs (all labs ordered are listed, but only abnormal results are displayed) Labs Reviewed  CBC WITH DIFFERENTIAL/PLATELET - Abnormal; Notable for the following:       Result Value   RBC 3.93 (*)    Hemoglobin 11.0 (*)    HCT 34.4 (*)    RDW 16.0 (*)    All other components within normal limits  COMPREHENSIVE METABOLIC PANEL - Abnormal; Notable for the following:    Potassium 5.2 (*)    Chloride 97 (*)    Glucose, Bld 131 (*)    BUN 56 (*)    Creatinine, Ser 13.80 (*)    Calcium 8.7 (*)    Albumin 3.4 (*)    ALT 11 (*)    Alkaline Phosphatase 184 (*)    GFR calc non Af Amer 4 (*)    GFR calc Af Amer 4 (*)    All other components within normal limits    EKG  EKG Interpretation None       Radiology No results found.  Procedures Procedures (including critical care time)  Medications Ordered in ED Medications  ondansetron (ZOFRAN-ODT) disintegrating tablet 4 mg (4 mg Oral Given 07/05/16 0026)     Initial Impression / Assessment and  Plan / ED Course  I  have reviewed the triage vital signs and the nursing notes.  Pertinent labs & imaging results that were available during my care of the patient were reviewed by me and considered in my medical decision making (see chart for details).  Clinical Course     12:30 AM Patient presenting for symptoms c/w need for hemodialysis. Last dialyzed on 07/02/16. Will monitor in ED and consult nephrology in the morning to coordinate.  6:06 AM Case discussed with Dr. Florene Glen of nephrology. Dr. Florene Glen will contact dialysis center to coordinate dialysis for today. Dr. Florene Glen is unsure of when dialysis can occur, but will fit patient into current schedule as soon as he is able. Patient with stable vital signs. Resting in bed since arrival without complaints. HTN likely secondary to need for dialysis. Labs at baseline.   Final Clinical Impressions(s) / ED Diagnoses   Final diagnoses:  Admission for dialysis [facility])    New Prescriptions New Prescriptions   No medications on file     [name], PA-C 07/05/16 [place], DO 07/05/16 [phone]

## 2016-07-05 NOTE — ED Notes (Signed)
Pt eating breakfast at this time.

## 2016-07-05 NOTE — ED Notes (Signed)
ED Provider at bedside.

## 2016-07-05 NOTE — ED Notes (Signed)
[place], [place] notified pt is leaving AMA.

## 2016-07-05 NOTE — ED Notes (Addendum)
Pt requesting pain medication. [place], [place] notified RN that no pain medication would be ordered. RN made pt aware that not pain medication would be ordered. Pt states "Well I have pain medication at home I can go take. I called dialysis and they said it wouldn't be until 8 tonight. So I will be back then." RN educated pt that he would have to leave AMA and he would have to check back in. Pt states "That is fine, I have to go home and feed my dogs." RN will notify PA.

## 2016-07-05 NOTE — ED Provider Notes (Signed)
[facility] DEPT Provider Note   CSN: [id number] Arrival date & time: 07/05/16  1953   By signing my name below, I, Evelene Croon, attest that this documentation has been prepared under the direction and in the presence of Orpah Greek, MD . Electronically Signed: Evelene Croon, Scribe. 07/05/2016. 11:39 PM.   History   Chief Complaint Chief Complaint  Patient presents with  . needs dialysis    The history is provided by the patient. No language interpreter was used.   HPI Comments:  Frank [name] is a 50 y.o. male with a history of ESRD, who presents to the Emergency Department requesting dialysis. He states he was in the ED earlier today for the same but left. He has no acute symptoms or complaints at this time. Pt was last dialyzed on 07/02/16 following ED admission. Pt is seen frequently in the ED requesting dialysis.   Past Medical History:  Diagnosis Date  . Anemia   . Anxiety   . Chronic combined systolic and diastolic CHF (congestive heart failure) (HCC)    a. EF 20-25% by echo in 08/2015 b. echo 10/2015: EF 35-40%, diffuse HK, severe LAE, moderate RAE, small pericardial effusion  . Complication of anesthesia    itching, sore throat  . Depression   . Dialysis patient ([place])   . ESRD (end stage renal disease) ([place])    due to HTN per patient, followed at [facility], s/p failed kidney transplant - dialysis Tue, Th, Sat  . Hyperkalemia 12/2015  . Hypertension   . Junctional rhythm    a. noted in 08/2015: hyperkalemic at that time  b. 12/2015: presented in junctional rhythm w/ K+ of 6.6. Resolved with improvement of K+ levels.  . Nonischemic cardiomyopathy ([place])    a. 08/2014: cath showing minimal CAD, but tortuous arteries noted.   . Personal history of DVT (deep vein thrombosis)/ PE 05/26/2016   In Oct 2015 had small subsemental LUL PE w/o DVT (LE dopplers neg) and was felt to be HD cath related, treated w coumadin.  IN May 2016 had small vein DVT (acute/subacute)  in the R basilic/ brachial veins of the RUE, resumed on coumadin.  Had R sided HD cath at that time.    . Renal insufficiency   . Shortness of breath   . Type II diabetes mellitus (HCC)    No history per patient, but remains under history as A1c would not be accurate given on dialysis    Patient Active Problem List   Diagnosis Date Noted  . Anemia associated with chronic renal failure   . End-stage renal disease on hemodialysis ([place])   . Subtherapeutic international normalized ratio (INR)   . Hypertension   . Dialysis patient, noncompliant ([place]) 06/22/2016  . ESRD (end stage renal disease) on dialysis ([place])   . ESRD (end stage renal disease) ([place]) 06/20/2016  . ESRD needing dialysis ([place]) 06/14/2016  . Uremia 06/07/2016  . Hyperkalemia 05/29/2016  . Chronic back pain 05/29/2016  . GERD (gastroesophageal reflux disease) 05/29/2016  . Personal history of DVT (deep vein thrombosis)/ PE 05/26/2016  . Bradycardia 03/31/2016  . Nonischemic cardiomyopathy ([place]) 01/09/2016  . Bilateral low back pain without sciatica   . Left renal mass 10/30/2015  . Constipation 10/30/2015  . Adjustment disorder with mixed anxiety and depressed mood 08/20/2015  . Chronic epididymitis 06/19/2015  . Groin pain, chronic, right 06/19/2015  . Incarcerated right inguinal hernia 02/16/2015  . Essential hypertension 01/02/2015  . Dyslipidemia   . ESRD  on hemodialysis ([place])   . Anemia of chronic kidney failure 06/24/2013    Past Surgical History:  Procedure Laterality Date  . CAPD INSERTION    . CAPD REMOVAL    . INGUINAL HERNIA REPAIR Right 02/14/2015   Procedure: REPAIR INCARCERATED RIGHT INGUINAL HERNIA;  Surgeon: [name], MD;  Location: [facility];  Service: General;  Laterality: Right;  . INSERTION OF DIALYSIS CATHETER Right 09/23/2015   Procedure: exchange of Right internal Dialysis Catheter.;  Surgeon: [name], MD;  Location: [facility];  Service: Vascular;  Laterality: Right;  . KIDNEY RECEIPIENT   2006   failed and started HD in March 2014  . LEFT HEART CATHETERIZATION WITH CORONARY ANGIOGRAM N/A 09/02/2014   Procedure: LEFT HEART CATHETERIZATION WITH CORONARY ANGIOGRAM;  Surgeon: [name], MD;  Location: [facility] C[facility] LAB;  Service: Cardiovascular;  Laterality: N/A;     Home Medications    Prior to Admission medications   Medication Sig Start Date End Date Taking? Authorizing Provider  allopurinol (ZYLOPRIM) 100 MG tablet Take 100 mg by mouth daily.   Yes Historical Provider, MD  amLODipine (NORVASC) 10 MG tablet Take 10 mg by mouth 2 (two) times daily.    Yes Historical Provider, MD  atorvastatin (LIPITOR) 40 MG tablet Take 1 tablet (40 mg total) by mouth daily at 6 PM. 09/02/14  Yes Barton Dubois, MD  carvedilol (COREG) 25 MG tablet Take 25 mg by mouth 2 (two) times daily with a meal.   Yes Historical Provider, MD  cinacalcet (SENSIPAR) 30 MG tablet Take 30 mg by mouth every evening.    Yes Historical Provider, MD  cloNIDine (CATAPRES - DOSED IN MG/24 HR) 0.3 mg/24hr patch Place 0.3 mg onto the skin once a week.   Yes Historical Provider, MD  doxazosin (CARDURA) 2 MG tablet Take 2 mg by mouth daily.   Yes Historical Provider, MD  famotidine (PEPCID) 20 MG tablet Take 1 tablet (20 mg total) by mouth 2 (two) times daily. 02/13/16  Yes Charlesetta Shanks, MD  gabapentin (NEURONTIN) 100 MG capsule Take 1 capsule (100 mg total) by mouth at bedtime. Patient taking differently: Take 100 mg by mouth 3 (three) times daily.  05/21/16  Yes [name], DO  hydrALAZINE (APRESOLINE) 100 MG tablet Take 100 mg by mouth 2 (two) times daily.    Yes Historical Provider, MD  isosorbide mononitrate (IMDUR) 60 MG 24 hr tablet Take 1 tablet (60 mg total) by mouth daily. 01/09/16  Yes Smiley Houseman, MD  omeprazole (PRILOSEC) 20 MG capsule Take 20 mg by mouth daily. 07/01/13  Yes Historical Provider, MD  ondansetron (ZOFRAN ODT) 4 MG disintegrating tablet Take 1 tablet (4 mg total) by mouth every 8  (eight) hours as needed for nausea or vomiting. 05/16/16  Yes Sherwood Gambler, MD  oxycodone (OXY-IR) 5 MG capsule Take 1 capsule (5 mg total) by mouth every 4 (four) hours as needed for pain. 05/30/16  Yes [name], MD  oxyCODONE-acetaminophen (PERCOCET/ROXICET) 5-325 MG tablet Take 1 tablet by mouth every 6 (six) hours as needed for severe pain.   Yes Historical Provider, MD  predniSONE (DELTASONE) 10 MG tablet Take 10 mg by mouth daily with breakfast.   Yes Historical Provider, MD  sevelamer carbonate (RENVELA) 800 MG tablet Take 1,600 mg by mouth 3 (three) times daily with meals.    Yes Historical Provider, MD  warfarin (COUMADIN) 2 MG tablet Take 2 mg by mouth daily. Take with 62m tablet to  equal 16m 01/31/16  Yes Historical Provider, MD  warfarin (COUMADIN) 5 MG tablet Take 5 mg by mouth See admin instructions. Take 1 tablet (5 mg) by mouth with a 2 mg tablet for a 7 mg dose 01/31/16  Yes Historical Provider, MD    Family History Family History  Problem Relation Age of Onset  . Hypertension Other     Social History Social History  Substance Use Topics  . Smoking status: Former Smoker    Packs/day: 0.00    Years: 1.00    Types: Cigarettes  . Smokeless tobacco: Never Used     Comment: quit Jan 2014  . Alcohol use No     Allergies   Butalbital-apap-caffeine; Ferrlecit [na ferric gluc cplx in sucrose]; Minoxidil; Tylenol [acetaminophen]; and Darvocet [propoxyphene n-acetaminophen]   Review of Systems Review of Systems  Constitutional: Negative for chills and fever.  Respiratory: Negative for shortness of breath.   Cardiovascular: Negative for chest pain.  All other systems reviewed and are negative.    Physical Exam Updated Vital Signs BP 162/100 (BP Location: Right Arm)   Pulse 64   Temp 98.4 F (36.9 C) (Oral)   Resp 16   SpO2 98%   Physical Exam  Constitutional: He is oriented to person, place, and time. He appears well-developed and well-nourished. No  distress.  HENT:  Head: Normocephalic and atraumatic.  Right Ear: Hearing normal.  Left Ear: Hearing normal.  Nose: Nose normal.  Mouth/Throat: Oropharynx is clear and moist and mucous membranes are normal.  Eyes: Conjunctivae and EOM are normal. Pupils are equal, round, and reactive to light.  Neck: Normal range of motion. Neck supple.  Cardiovascular: Regular rhythm, S1 normal and S2 normal.  Exam reveals no gallop and no friction rub.   No murmur heard. Pulmonary/Chest: Effort normal and breath sounds normal. No respiratory distress. He exhibits no tenderness.  Abdominal: Soft. Normal appearance and bowel sounds are normal. There is no hepatosplenomegaly. There is no tenderness. There is no rebound, no guarding, no tenderness at McBurney's point and negative Murphy's sign. No hernia.  Musculoskeletal: Normal range of motion.  Neurological: He is alert and oriented to person, place, and time. He has normal strength. No cranial nerve deficit or sensory deficit. Coordination normal. GCS eye subscore is 4. GCS verbal subscore is 5. GCS motor subscore is 6.  Skin: Skin is warm, dry and intact. No rash noted. No cyanosis.  Psychiatric: He has a normal mood and affect. His speech is normal and behavior is normal. Thought content normal.  Nursing note and vitals reviewed.    ED Treatments / Results  DIAGNOSTIC STUDIES:  Oxygen Saturation is 100% on RA, normal by my interpretation.    COORDINATION OF CARE:  11:20 PM Discussed treatment plan with pt at bedside and pt agreed to plan.  Labs (all labs ordered are listed, but only abnormal results are displayed) Labs Reviewed  I-STAT CHEM 8, ED - Abnormal; Notable for the following:       Result Value   Potassium 5.9 (*)    Chloride 100 (*)    BUN 65 (*)    Creatinine, Ser 16.80 (*)    Calcium, Ion 0.95 (*)    Hemoglobin 10.2 (*)    HCT 30.0 (*)    All other components within normal limits    EKG  EKG Interpretation None       Radiology No results found.  Procedures Procedures (including critical care time)  Medications Ordered in ED  Medications - No data to display   Initial Impression / Assessment and Plan / ED Course  I have reviewed the triage vital signs and the nursing notes.  Pertinent labs & imaging results that were available during my care of the patient were reviewed by me and considered in my medical decision making (see chart for details).  Clinical Course      12:19 AM Discussed case with Dr. Joelyn Oms, nephrology, who states pt left AMA. Pt will be taken to dialysis in the AM.   Presents with need for dialysis. Patient is not in extremis, no evidence of volume overload. Potassium mildly elevated, will give Kayexalate. Plan dialysis in morning.  Final Clinical Impressions(s) / ED Diagnoses   Final diagnoses:  Hyperkalemia  ESRD (end stage renal disease) ([place])    New Prescriptions New Prescriptions   No medications on file   I personally performed the services described in this documentation, which was scribed in my presence. The recorded information has been reviewed and is accurate.     Orpah Greek, MD 07/06/16 [phone]

## 2016-07-06 LAB — RENAL FUNCTION PANEL
ALBUMIN: 3.1 g/dL — AB (ref 3.5–5.0)
ANION GAP: 16 — AB (ref 5–15)
BUN: 74 mg/dL — ABNORMAL HIGH (ref 6–20)
CALCIUM: 8.1 mg/dL — AB (ref 8.9–10.3)
CO2: 26 mmol/L (ref 22–32)
Chloride: 96 mmol/L — ABNORMAL LOW (ref 101–111)
Creatinine, Ser: 17.28 mg/dL — ABNORMAL HIGH (ref 0.61–1.24)
GFR, EST AFRICAN AMERICAN: 3 mL/min — AB (ref >60)
GFR, EST NON AFRICAN AMERICAN: 3 mL/min — AB (ref >60)
GLUCOSE: 81 mg/dL (ref 65–99)
PHOSPHORUS: 7.8 mg/dL — AB (ref 2.5–4.6)
POTASSIUM: 6.6 mmol/L — AB (ref 3.5–5.1)
Sodium: 138 mmol/L (ref 135–145)

## 2016-07-06 LAB — I-STAT CHEM 8, ED
BUN: 65 mg/dL — ABNORMAL HIGH (ref 6–20)
CREATININE: 16.8 mg/dL — AB (ref 0.61–1.24)
Calcium, Ion: 0.95 mmol/L — ABNORMAL LOW (ref 1.15–1.40)
Chloride: 100 mmol/L — ABNORMAL LOW (ref 101–111)
Glucose, Bld: 97 mg/dL (ref 65–99)
HEMATOCRIT: 30 % — AB (ref 39.0–52.0)
HEMOGLOBIN: 10.2 g/dL — AB (ref 13.0–17.0)
POTASSIUM: 5.9 mmol/L — AB (ref 3.5–5.1)
SODIUM: 138 mmol/L (ref 135–145)
TCO2: 26 mmol/L (ref 0–100)

## 2016-07-06 LAB — CBC
HEMATOCRIT: 30.7 % — AB (ref 39.0–52.0)
HEMOGLOBIN: 9.7 g/dL — AB (ref 13.0–17.0)
MCH: 27.2 pg (ref 26.0–34.0)
MCHC: 31.6 g/dL (ref 30.0–36.0)
MCV: 86 fL (ref 78.0–100.0)
Platelets: 205 10*3/uL (ref 150–400)
RBC: 3.57 MIL/uL — AB (ref 4.22–5.81)
RDW: 15.5 % (ref 11.5–15.5)
WBC: 5.8 10*3/uL (ref 4.0–10.5)

## 2016-07-06 MED ORDER — SODIUM CHLORIDE 0.9 % IV SOLN: 100.0000 mL | INTRAVENOUS | Status: DC | PRN

## 2016-07-06 MED ORDER — ALTEPLASE 2 MG IJ SOLR: 2.0000 mg | Freq: Once | INTRAMUSCULAR | Status: DC | PRN

## 2016-07-06 MED ORDER — PENTAFLUOROPROP-TETRAFLUOROETH EX AERO
1.0000 "application " | INHALATION_SPRAY | CUTANEOUS | Status: DC | PRN
  Filled 2016-07-06: qty 30

## 2016-07-06 MED ORDER — LIDOCAINE HCL (PF) 1 % IJ SOLN: 5.0000 mL | INTRAMUSCULAR | Status: DC | PRN

## 2016-07-06 MED ORDER — HEPARIN SODIUM (PORCINE) 1000 UNIT/ML DIALYSIS
1000.0000 [IU] | INTRAMUSCULAR | Status: DC | PRN
  Administered 2016-07-06: 1000 [IU] via INTRAVENOUS_CENTRAL
  Filled 2016-07-06: qty 1

## 2016-07-06 MED ORDER — LIDOCAINE-PRILOCAINE 2.5-2.5 % EX CREA
1.0000 "application " | TOPICAL_CREAM | CUTANEOUS | Status: DC | PRN
  Filled 2016-07-06: qty 5

## 2016-07-06 MED ORDER — SODIUM POLYSTYRENE SULFONATE 15 GM/60ML PO SUSP
30.0000 g | Freq: Once | ORAL | Status: DC
  Filled 2016-07-06: qty 120

## 2016-07-06 NOTE — Procedures (Signed)
I was present at this dialysis session. I have reviewed the session itself and made appropriate changes.   Pt refused cannulaton of AV access today -- initially tellin RN he doesn't have an AV access and then telling me it was sor --- no evidence of infiltration or bruising.Using [facility].  He can't CLIP to outpt unit until not using TDC.       Recent Labs Lab 07/02/16 0653 07/04/16 2318 07/06/16 0009  NA 137 138 138  K 5.6* 5.2* 5.9*  CL 98* 97* 100*  CO2 26 28  --   GLUCOSE 84 131* 97  BUN 48* 56* 65*  CREATININE 12.42* 13.80* 16.80*  CALCIUM 8.0* 8.7*  --   PHOS 7.3*  --   --      Recent Labs Lab 07/02/16 0653 07/04/16 2318 07/06/16 0009 07/06/16 0829  WBC 5.3 5.5  --  5.8  NEUTROABS  --  3.2  --   --   HGB 10.3* 11.0* 10.2* 9.7*  HCT 32.6* 34.4* 30.0* 30.7*  MCV 87.6 87.5  --  86.0  PLT 185 206  --  205    Scheduled Meds: . sodium polystyrene  30 g Oral Once   Continuous Infusions: . sodium chloride    . sodium chloride     PRN Meds:.sodium chloride, sodium chloride, alteplase, heparin, lidocaine (PF), lidocaine-prilocaine, pentafluoroprop-tetrafluoroeth   Pearson Grippe  MD 07/06/2016, 9:12 AM

## 2016-07-06 NOTE — Progress Notes (Signed)
Frank [name] was dialyzed today for 3 hours due to pt's signing of [place] 1 hour prior to end of treatment. Refused to explain the reason for this action. Also pt. refused to to have catheter tips taped together to secure caps, and integrity of catheter.

## 2016-07-06 NOTE — Progress Notes (Signed)
K+ 6.6 with no hemolysis, reported to Dr. Joelyn Oms: no change in HD order per Dr. Joelyn Oms.

## 2016-07-06 NOTE — ED Notes (Signed)
Per Sal in HD, pt ok to come to floor.

## 2016-07-06 NOTE — ED Notes (Signed)
Per Dr. Betsey Holiday pt will not be dialyzed until AM.

## 2016-07-07 ENCOUNTER — Emergency Department (HOSPITAL_COMMUNITY)
Admission: EM | Admit: 2016-07-07 | Discharge: 2016-07-08 | Disposition: A | Discharge status: Home / Self Care | Payer: Medicare Other | Source: Home / Self Care | Attending: Emergency Medicine | Admitting: Emergency Medicine

## 2016-07-07 ENCOUNTER — Encounter (HOSPITAL_COMMUNITY): Payer: Self-pay | Admitting: *Deleted

## 2016-07-07 DIAGNOSIS — G8929 Other chronic pain: Secondary | ICD-10-CM | POA: Diagnosis not present

## 2016-07-07 DIAGNOSIS — M5440 Lumbago with sciatica, unspecified side: Secondary | ICD-10-CM

## 2016-07-07 DIAGNOSIS — E1122 Type 2 diabetes mellitus with diabetic chronic kidney disease: Secondary | ICD-10-CM

## 2016-07-07 DIAGNOSIS — Z87891 Personal history of nicotine dependence: Secondary | ICD-10-CM | POA: Insufficient documentation

## 2016-07-07 DIAGNOSIS — T8612 Kidney transplant failure: Secondary | ICD-10-CM | POA: Diagnosis not present

## 2016-07-07 DIAGNOSIS — I429 Cardiomyopathy, unspecified: Secondary | ICD-10-CM | POA: Diagnosis not present

## 2016-07-07 DIAGNOSIS — E875 Hyperkalemia: Secondary | ICD-10-CM | POA: Diagnosis not present

## 2016-07-07 DIAGNOSIS — N186 End stage renal disease: Secondary | ICD-10-CM | POA: Insufficient documentation

## 2016-07-07 DIAGNOSIS — I132 Hypertensive heart and chronic kidney disease with heart failure and with stage 5 chronic kidney disease, or end stage renal disease: Secondary | ICD-10-CM | POA: Insufficient documentation

## 2016-07-07 DIAGNOSIS — I120 Hypertensive chronic kidney disease with stage 5 chronic kidney disease or end stage renal disease: Secondary | ICD-10-CM | POA: Diagnosis not present

## 2016-07-07 DIAGNOSIS — R0789 Other chest pain: Secondary | ICD-10-CM | POA: Diagnosis not present

## 2016-07-07 DIAGNOSIS — I5042 Chronic combined systolic (congestive) and diastolic (congestive) heart failure: Secondary | ICD-10-CM | POA: Insufficient documentation

## 2016-07-07 DIAGNOSIS — Z992 Dependence on renal dialysis: Secondary | ICD-10-CM

## 2016-07-07 DIAGNOSIS — M545 Low back pain: Secondary | ICD-10-CM | POA: Diagnosis not present

## 2016-07-07 DIAGNOSIS — R531 Weakness: Secondary | ICD-10-CM | POA: Diagnosis not present

## 2016-07-07 DIAGNOSIS — R079 Chest pain, unspecified: Secondary | ICD-10-CM | POA: Diagnosis not present

## 2016-07-07 NOTE — ED Triage Notes (Signed)
The pt is c/o back pain with n v today   He reports that he is to have the rt upper chest catheter removed.  He is here every day with some type complaint. .   He has  Been dialyzed through the dialysis catheter

## 2016-07-08 DIAGNOSIS — Z7901 Long term (current) use of anticoagulants: Secondary | ICD-10-CM | POA: Diagnosis not present

## 2016-07-08 DIAGNOSIS — N186 End stage renal disease: Secondary | ICD-10-CM | POA: Diagnosis not present

## 2016-07-08 DIAGNOSIS — Z992 Dependence on renal dialysis: Secondary | ICD-10-CM | POA: Diagnosis not present

## 2016-07-08 DIAGNOSIS — I120 Hypertensive chronic kidney disease with stage 5 chronic kidney disease or end stage renal disease: Secondary | ICD-10-CM | POA: Diagnosis not present

## 2016-07-08 DIAGNOSIS — Z7952 Long term (current) use of systemic steroids: Secondary | ICD-10-CM | POA: Diagnosis not present

## 2016-07-08 DIAGNOSIS — R1110 Vomiting, unspecified: Secondary | ICD-10-CM | POA: Diagnosis not present

## 2016-07-08 DIAGNOSIS — Z940 Kidney transplant status: Secondary | ICD-10-CM | POA: Diagnosis not present

## 2016-07-08 DIAGNOSIS — R531 Weakness: Secondary | ICD-10-CM | POA: Diagnosis not present

## 2016-07-08 DIAGNOSIS — G8929 Other chronic pain: Secondary | ICD-10-CM | POA: Diagnosis not present

## 2016-07-08 DIAGNOSIS — R112 Nausea with vomiting, unspecified: Secondary | ICD-10-CM | POA: Diagnosis not present

## 2016-07-08 DIAGNOSIS — M545 Low back pain: Secondary | ICD-10-CM | POA: Diagnosis not present

## 2016-07-08 DIAGNOSIS — Z888 Allergy status to other drugs, medicaments and biological substances status: Secondary | ICD-10-CM | POA: Diagnosis not present

## 2016-07-08 LAB — I-STAT CHEM 8, ED
BUN: 52 mg/dL — AB (ref 6–20)
CHLORIDE: 103 mmol/L (ref 101–111)
CREATININE: 15.6 mg/dL — AB (ref 0.61–1.24)
Calcium, Ion: 0.97 mmol/L — ABNORMAL LOW (ref 1.15–1.40)
Glucose, Bld: 72 mg/dL (ref 65–99)
HEMATOCRIT: 37 % — AB (ref 39.0–52.0)
Hemoglobin: 12.6 g/dL — ABNORMAL LOW (ref 13.0–17.0)
POTASSIUM: 5.8 mmol/L — AB (ref 3.5–5.1)
SODIUM: 137 mmol/L (ref 135–145)
TCO2: 24 mmol/L (ref 0–100)

## 2016-07-08 NOTE — Progress Notes (Signed)
Pt completed HD tx without complications; denies pain, n/v, SOB, dizziness and ambulated off the unit to go home.

## 2016-07-08 NOTE — ED Notes (Signed)
Report given to Dialysis nurse.

## 2016-07-08 NOTE — ED Provider Notes (Signed)
[facility] DEPT Provider Note   CSN: [id number] Arrival date & time: 07/07/16  2327    History   Chief Complaint Chief Complaint  Patient presents with  . Back Pain    HPI TRAIVON MORRICAL is a 50 y.o. male.  50 year old male with a history of anxiety, depression, end-stage renal disease on hemodialysis, nonischemic cardiomyopathy, and diabetes mellitus presents to the emergency department for complaints of back pain and abdominal pain. He was last dialyzed on 07/06/2016 in the hospital. He left AGAINST MEDICAL ADVICE after 3 hours of dialysis. He states that he has been vomiting today. He has no emesis bag in his exam room. He has been drinking apple juice. Patient monitored in the room and since arrival approximately 3 hours ago. Patient denies any recent fevers. Patient has presented to the emergency department almost daily for similar complaints.   The history is provided by the patient. No language interpreter was used.  Back Pain      Past Medical History:  Diagnosis Date  . Anemia   . Anxiety   . Chronic combined systolic and diastolic CHF (congestive heart failure) (HCC)    a. EF 20-25% by echo in 08/2015 b. echo 10/2015: EF 35-40%, diffuse HK, severe LAE, moderate RAE, small pericardial effusion  . Complication of anesthesia    itching, sore throat  . Depression   . Dialysis patient ([place])   . ESRD (end stage renal disease) ([place])    due to HTN per patient, followed at [facility], s/p failed kidney transplant - dialysis Tue, Th, Sat  . Hyperkalemia 12/2015  . Hypertension   . Junctional rhythm    a. noted in 08/2015: hyperkalemic at that time  b. 12/2015: presented in junctional rhythm w/ K+ of 6.6. Resolved with improvement of K+ levels.  . Nonischemic cardiomyopathy ([place])    a. 08/2014: cath showing minimal CAD, but tortuous arteries noted.   . Personal history of DVT (deep vein thrombosis)/ PE 05/26/2016   In Oct 2015 had small subsemental LUL PE w/o DVT (LE dopplers  neg) and was felt to be HD cath related, treated w coumadin.  IN May 2016 had small vein DVT (acute/subacute) in the R basilic/ brachial veins of the RUE, resumed on coumadin.  Had R sided HD cath at that time.    . Renal insufficiency   . Shortness of breath   . Type II diabetes mellitus (HCC)    No history per patient, but remains under history as A1c would not be accurate given on dialysis    Patient Active Problem List   Diagnosis Date Noted  . Anemia associated with chronic renal failure   . End-stage renal disease on hemodialysis ([place])   . Subtherapeutic international normalized ratio (INR)   . Hypertension   . Dialysis patient, noncompliant ([place]) 06/22/2016  . ESRD (end stage renal disease) on dialysis ([place])   . ESRD (end stage renal disease) ([place]) 06/20/2016  . ESRD needing dialysis ([place]) 06/14/2016  . Uremia 06/07/2016  . Hyperkalemia 05/29/2016  . Chronic back pain 05/29/2016  . GERD (gastroesophageal reflux disease) 05/29/2016  . Personal history of DVT (deep vein thrombosis)/ PE 05/26/2016  . Bradycardia 03/31/2016  . Nonischemic cardiomyopathy ([place]) 01/09/2016  . Bilateral low back pain without sciatica   . Left renal mass 10/30/2015  . Constipation 10/30/2015  . Adjustment disorder with mixed anxiety and depressed mood 08/20/2015  . Chronic epididymitis 06/19/2015  . Groin pain, chronic, right 06/19/2015  . Incarcerated right  inguinal hernia 02/16/2015  . Essential hypertension 01/02/2015  . Dyslipidemia   . ESRD on hemodialysis ([place])   . Anemia of chronic kidney failure 06/24/2013    Past Surgical History:  Procedure Laterality Date  . CAPD INSERTION    . CAPD REMOVAL    . INGUINAL HERNIA REPAIR Right 02/14/2015   Procedure: REPAIR INCARCERATED RIGHT INGUINAL HERNIA;  Surgeon: [name], MD;  Location: [facility];  Service: General;  Laterality: Right;  . INSERTION OF DIALYSIS CATHETER Right 09/23/2015   Procedure: exchange of Right internal Dialysis Catheter.;   Surgeon: [name], MD;  Location: [facility];  Service: Vascular;  Laterality: Right;  . KIDNEY RECEIPIENT  2006   failed and started HD in March 2014  . LEFT HEART CATHETERIZATION WITH CORONARY ANGIOGRAM N/A 09/02/2014   Procedure: LEFT HEART CATHETERIZATION WITH CORONARY ANGIOGRAM;  Surgeon: [name], MD;  Location: [facility] CATH LAB;  Service: Cardiovascular;  Laterality: N/A;       Home Medications    Prior to Admission medications   Medication Sig Start Date End Date Taking? Authorizing Provider  allopurinol (ZYLOPRIM) 100 MG tablet Take 100 mg by mouth daily.    Historical Provider, MD  amLODipine (NORVASC) 10 MG tablet Take 10 mg by mouth 2 (two) times daily.     Historical Provider, MD  atorvastatin (LIPITOR) 40 MG tablet Take 1 tablet (40 mg total) by mouth daily at 6 PM. 09/02/14   Barton Dubois, MD  carvedilol (COREG) 25 MG tablet Take 25 mg by mouth 2 (two) times daily with a meal.    Historical Provider, MD  cinacalcet (SENSIPAR) 30 MG tablet Take 30 mg by mouth every evening.     Historical Provider, MD  cloNIDine (CATAPRES - DOSED IN MG/24 HR) 0.3 mg/24hr patch Place 0.3 mg onto the skin once a week.    Historical Provider, MD  doxazosin (CARDURA) 2 MG tablet Take 2 mg by mouth daily.    Historical Provider, MD  famotidine (PEPCID) 20 MG tablet Take 1 tablet (20 mg total) by mouth 2 (two) times daily. 02/13/16   Charlesetta Shanks, MD  gabapentin (NEURONTIN) 100 MG capsule Take 1 capsule (100 mg total) by mouth at bedtime. Patient taking differently: Take 100 mg by mouth 3 (three) times daily.  05/21/16   [name], DO  hydrALAZINE (APRESOLINE) 100 MG tablet Take 100 mg by mouth 2 (two) times daily.     Historical Provider, MD  isosorbide mononitrate (IMDUR) 60 MG 24 hr tablet Take 1 tablet (60 mg total) by mouth daily. 01/09/16   Smiley Houseman, MD  omeprazole (PRILOSEC) 20 MG capsule Take 20 mg by mouth daily. 07/01/13   Historical Provider, MD  ondansetron (ZOFRAN  ODT) 4 MG disintegrating tablet Take 1 tablet (4 mg total) by mouth every 8 (eight) hours as needed for nausea or vomiting. 05/16/16   Sherwood Gambler, MD  oxycodone (OXY-IR) 5 MG capsule Take 1 capsule (5 mg total) by mouth every 4 (four) hours as needed for pain. 05/30/16   [name], MD  oxyCODONE-acetaminophen (PERCOCET/ROXICET) 5-325 MG tablet Take 1 tablet by mouth every 6 (six) hours as needed for severe pain.    Historical Provider, MD  predniSONE (DELTASONE) 10 MG tablet Take 10 mg by mouth daily with breakfast.    Historical Provider, MD  sevelamer carbonate (RENVELA) 800 MG tablet Take 1,600 mg by mouth 3 (three) times daily with meals.     Historical Provider, MD  warfarin (COUMADIN) 2 MG tablet Take 2 mg by mouth daily. Take with 46m tablet to equal 710m7/12/17   Historical Provider, MD  warfarin (COUMADIN) 5 MG tablet Take 5 mg by mouth See admin instructions. Take 1 tablet (5 mg) by mouth with a 2 mg tablet for a 7 mg dose 01/31/16   Historical Provider, MD    Family History Family History  Problem Relation Age of Onset  . Hypertension Other     Social History Social History  Substance Use Topics  . Smoking status: Former Smoker    Packs/day: 0.00    Years: 1.00    Types: Cigarettes  . Smokeless tobacco: Never Used     Comment: quit Jan 2014  . Alcohol use No     Allergies   Butalbital-apap-caffeine; Ferrlecit [na ferric gluc cplx in sucrose]; Minoxidil; Tylenol [acetaminophen]; and Darvocet [propoxyphene n-acetaminophen]   Review of Systems Review of Systems  Musculoskeletal: Positive for back pain.   Ten systems reviewed and are negative for acute change, except as noted in the HPI.    Physical Exam Updated Vital Signs BP 161/94   Pulse 74   Temp 98.7 F (37.1 C) (Oral)   Resp 18   SpO2 98%   Physical Exam  Constitutional: He is oriented to person, place, and time. He appears well-developed and well-nourished. No distress.  Nontoxic and in NAD    HENT:  Head: Normocephalic and atraumatic.  Eyes: Conjunctivae and EOM are normal. No scleral icterus.  Neck: Normal range of motion.  Cardiovascular: Normal rate, regular rhythm and intact distal pulses.   Pulmonary/Chest: Effort normal. No respiratory distress. He has no wheezes.  Respirations even and unlabored  Abdominal: Soft. He exhibits no distension and no mass. There is no tenderness. There is no guarding.  Soft, nontender abdomen  Musculoskeletal: Normal range of motion.  Neurological: He is alert and oriented to person, place, and time. He exhibits normal muscle tone. Coordination normal.  Skin: Skin is warm and dry. No rash noted. He is not diaphoretic. No erythema. No pallor.  Psychiatric: He has a normal mood and affect. His behavior is normal.  Nursing note and vitals reviewed.    ED Treatments / Results  Labs (all labs ordered are listed, but only abnormal results are displayed) Labs Reviewed  I-STAT CHEM 8, ED - Abnormal; Notable for the following:       Result Value   Potassium 5.8 (*)    BUN 52 (*)    Creatinine, Ser 15.60 (*)    Calcium, Ion 0.97 (*)    Hemoglobin 12.6 (*)    HCT 37.0 (*)    All other components within normal limits    EKG  EKG Interpretation None       Radiology No results found.  Procedures Procedures (including critical care time)  Medications Ordered in ED Medications - No data to display   Initial Impression / Assessment and Plan / ED Course  I have reviewed the triage vital signs and the nursing notes.  Pertinent labs & imaging results that were available during my care of the patient were reviewed by me and considered in my medical decision making (see chart for details).  Clinical Course     [age]ear old male presents to the emergency department for complaints of abdominal and back pain. These are chronic and associated with presentations for dialysis. Patient with chronic hyperkalemia. This is stable today.  Creatinine is also stable. Patient mildly hypertensive which may be  due to his need for dialysis. Plan to discuss coordination of dialysis with nephrology. Patient declined EKG in ED.  Orders placed for HD by Dr. Jonnie Finner. Patient transported to dialysis center.   Final Clinical Impressions(s) / ED Diagnoses   Final diagnoses:  Chronic low back pain, unspecified back pain laterality, with sciatica presence unspecified  ESRD needing dialysis [facility])    New Prescriptions New Prescriptions   No medications on file     [name], PA-C 07/08/16 [place], DO 07/08/16 [phone]

## 2016-07-08 NOTE — ED Notes (Signed)
Pt refused EKG. Pt stated he is here for dilaysis only. States he wants to speak to provider. RN notified.

## 2016-07-08 NOTE — ED Notes (Signed)
Explained to patient need for ekg.  Continues to refuse.  Provider notified.

## 2016-07-08 NOTE — Progress Notes (Signed)
I spoke with him.  We will remove TDC in AM at [facility]   Willl begin CLIP process and after catheter removed, next HD at [facility]. Bobie Caris C

## 2016-07-09 DIAGNOSIS — Z452 Encounter for adjustment and management of vascular access device: Secondary | ICD-10-CM | POA: Diagnosis not present

## 2016-07-09 DIAGNOSIS — T82858A Stenosis of vascular prosthetic devices, implants and grafts, initial encounter: Secondary | ICD-10-CM | POA: Diagnosis not present

## 2016-07-09 DIAGNOSIS — Z992 Dependence on renal dialysis: Secondary | ICD-10-CM | POA: Diagnosis not present

## 2016-07-09 DIAGNOSIS — I871 Compression of vein: Secondary | ICD-10-CM | POA: Diagnosis not present

## 2016-07-09 DIAGNOSIS — N186 End stage renal disease: Secondary | ICD-10-CM | POA: Diagnosis not present

## 2016-07-10 ENCOUNTER — Encounter (HOSPITAL_COMMUNITY): Payer: Self-pay | Admitting: Emergency Medicine

## 2016-07-10 ENCOUNTER — Emergency Department (HOSPITAL_COMMUNITY): Payer: Medicare Other

## 2016-07-10 ENCOUNTER — Inpatient Hospital Stay (HOSPITAL_COMMUNITY)
Admission: EM | Admit: 2016-07-10 | Discharge: 2016-07-11 | DRG: 640 | Disposition: A | Discharge status: Home / Self Care | Payer: Medicare Other | Attending: Internal Medicine | Admitting: Internal Medicine

## 2016-07-10 DIAGNOSIS — F329 Major depressive disorder, single episode, unspecified: Secondary | ICD-10-CM | POA: Diagnosis present

## 2016-07-10 DIAGNOSIS — Z86718 Personal history of other venous thrombosis and embolism: Secondary | ICD-10-CM | POA: Diagnosis not present

## 2016-07-10 DIAGNOSIS — Z79899 Other long term (current) drug therapy: Secondary | ICD-10-CM

## 2016-07-10 DIAGNOSIS — E8889 Other specified metabolic disorders: Secondary | ICD-10-CM | POA: Diagnosis present

## 2016-07-10 DIAGNOSIS — Z7901 Long term (current) use of anticoagulants: Secondary | ICD-10-CM

## 2016-07-10 DIAGNOSIS — Z8249 Family history of ischemic heart disease and other diseases of the circulatory system: Secondary | ICD-10-CM

## 2016-07-10 DIAGNOSIS — Z9115 Patient's noncompliance with renal dialysis: Secondary | ICD-10-CM | POA: Diagnosis not present

## 2016-07-10 DIAGNOSIS — T8610 Unspecified complication of kidney transplant: Secondary | ICD-10-CM | POA: Diagnosis not present

## 2016-07-10 DIAGNOSIS — Z86711 Personal history of pulmonary embolism: Secondary | ICD-10-CM | POA: Diagnosis not present

## 2016-07-10 DIAGNOSIS — I120 Hypertensive chronic kidney disease with stage 5 chronic kidney disease or end stage renal disease: Secondary | ICD-10-CM | POA: Diagnosis not present

## 2016-07-10 DIAGNOSIS — E875 Hyperkalemia: Principal | ICD-10-CM | POA: Diagnosis present

## 2016-07-10 DIAGNOSIS — N2581 Secondary hyperparathyroidism of renal origin: Secondary | ICD-10-CM | POA: Diagnosis present

## 2016-07-10 DIAGNOSIS — E871 Hypo-osmolality and hyponatremia: Secondary | ICD-10-CM | POA: Diagnosis present

## 2016-07-10 DIAGNOSIS — I132 Hypertensive heart and chronic kidney disease with heart failure and with stage 5 chronic kidney disease, or end stage renal disease: Secondary | ICD-10-CM | POA: Diagnosis present

## 2016-07-10 DIAGNOSIS — F419 Anxiety disorder, unspecified: Secondary | ICD-10-CM | POA: Diagnosis present

## 2016-07-10 DIAGNOSIS — Z888 Allergy status to other drugs, medicaments and biological substances status: Secondary | ICD-10-CM

## 2016-07-10 DIAGNOSIS — N186 End stage renal disease: Secondary | ICD-10-CM

## 2016-07-10 DIAGNOSIS — R404 Transient alteration of awareness: Secondary | ICD-10-CM | POA: Diagnosis not present

## 2016-07-10 DIAGNOSIS — Y830 Surgical operation with transplant of whole organ as the cause of abnormal reaction of the patient, or of later complication, without mention of misadventure at the time of the procedure: Secondary | ICD-10-CM | POA: Diagnosis present

## 2016-07-10 DIAGNOSIS — N185 Chronic kidney disease, stage 5: Secondary | ICD-10-CM

## 2016-07-10 DIAGNOSIS — T8612 Kidney transplant failure: Secondary | ICD-10-CM | POA: Diagnosis present

## 2016-07-10 DIAGNOSIS — R001 Bradycardia, unspecified: Secondary | ICD-10-CM | POA: Diagnosis not present

## 2016-07-10 DIAGNOSIS — R791 Abnormal coagulation profile: Secondary | ICD-10-CM | POA: Diagnosis present

## 2016-07-10 DIAGNOSIS — D631 Anemia in chronic kidney disease: Secondary | ICD-10-CM | POA: Diagnosis not present

## 2016-07-10 DIAGNOSIS — I429 Cardiomyopathy, unspecified: Secondary | ICD-10-CM | POA: Diagnosis present

## 2016-07-10 DIAGNOSIS — I10 Essential (primary) hypertension: Secondary | ICD-10-CM

## 2016-07-10 DIAGNOSIS — R0789 Other chest pain: Secondary | ICD-10-CM | POA: Diagnosis not present

## 2016-07-10 DIAGNOSIS — E1122 Type 2 diabetes mellitus with diabetic chronic kidney disease: Secondary | ICD-10-CM | POA: Diagnosis present

## 2016-07-10 DIAGNOSIS — I5042 Chronic combined systolic (congestive) and diastolic (congestive) heart failure: Secondary | ICD-10-CM | POA: Diagnosis present

## 2016-07-10 DIAGNOSIS — Z886 Allergy status to analgesic agent status: Secondary | ICD-10-CM

## 2016-07-10 DIAGNOSIS — N189 Chronic kidney disease, unspecified: Secondary | ICD-10-CM | POA: Diagnosis present

## 2016-07-10 DIAGNOSIS — I428 Other cardiomyopathies: Secondary | ICD-10-CM

## 2016-07-10 DIAGNOSIS — R531 Weakness: Secondary | ICD-10-CM | POA: Diagnosis not present

## 2016-07-10 DIAGNOSIS — N2889 Other specified disorders of kidney and ureter: Secondary | ICD-10-CM

## 2016-07-10 DIAGNOSIS — Z87891 Personal history of nicotine dependence: Secondary | ICD-10-CM

## 2016-07-10 DIAGNOSIS — K219 Gastro-esophageal reflux disease without esophagitis: Secondary | ICD-10-CM | POA: Diagnosis present

## 2016-07-10 DIAGNOSIS — Z992 Dependence on renal dialysis: Secondary | ICD-10-CM | POA: Diagnosis not present

## 2016-07-10 DIAGNOSIS — R079 Chest pain, unspecified: Secondary | ICD-10-CM | POA: Diagnosis not present

## 2016-07-10 LAB — COMPREHENSIVE METABOLIC PANEL
ALBUMIN: 3.6 g/dL (ref 3.5–5.0)
ALT: 11 U/L — AB (ref 17–63)
ANION GAP: 13 (ref 5–15)
AST: 28 U/L (ref 15–41)
Alkaline Phosphatase: 172 U/L — ABNORMAL HIGH (ref 38–126)
BUN: 56 mg/dL — ABNORMAL HIGH (ref 6–20)
CHLORIDE: 95 mmol/L — AB (ref 101–111)
CO2: 23 mmol/L (ref 22–32)
CREATININE: 14.16 mg/dL — AB (ref 0.61–1.24)
Calcium: 8.3 mg/dL — ABNORMAL LOW (ref 8.9–10.3)
GFR calc non Af Amer: 3 mL/min — ABNORMAL LOW (ref >60)
GFR, EST AFRICAN AMERICAN: 4 mL/min — AB (ref >60)
GLUCOSE: 107 mg/dL — AB (ref 65–99)
Potassium: >7.5 mmol/L (ref 3.5–5.1)
SODIUM: 131 mmol/L — AB (ref 135–145)
Total Bilirubin: 1.4 mg/dL — ABNORMAL HIGH (ref 0.3–1.2)
Total Protein: 8 g/dL (ref 6.5–8.1)

## 2016-07-10 LAB — RENAL FUNCTION PANEL
ANION GAP: 14 (ref 5–15)
Albumin: 3.1 g/dL — ABNORMAL LOW (ref 3.5–5.0)
BUN: 56 mg/dL — ABNORMAL HIGH (ref 6–20)
CALCIUM: 8.3 mg/dL — AB (ref 8.9–10.3)
CO2: 24 mmol/L (ref 22–32)
CREATININE: 14.75 mg/dL — AB (ref 0.61–1.24)
Chloride: 95 mmol/L — ABNORMAL LOW (ref 101–111)
GFR calc Af Amer: 4 mL/min — ABNORMAL LOW (ref >60)
GFR calc non Af Amer: 3 mL/min — ABNORMAL LOW (ref >60)
GLUCOSE: 165 mg/dL — AB (ref 65–99)
Phosphorus: 4.6 mg/dL (ref 2.5–4.6)
Potassium: 5.4 mmol/L — ABNORMAL HIGH (ref 3.5–5.1)
SODIUM: 133 mmol/L — AB (ref 135–145)

## 2016-07-10 LAB — PROTIME-INR
INR: 1.07
PROTHROMBIN TIME: 13.9 s (ref 11.4–15.2)

## 2016-07-10 LAB — CBC WITH DIFFERENTIAL/PLATELET
BASOS PCT: 0 %
Basophils Absolute: 0 10*3/uL (ref 0.0–0.1)
EOS ABS: 0 10*3/uL (ref 0.0–0.7)
EOS PCT: 0 %
HCT: 33.3 % — ABNORMAL LOW (ref 39.0–52.0)
Hemoglobin: 11 g/dL — ABNORMAL LOW (ref 13.0–17.0)
LYMPHS ABS: 0.7 10*3/uL (ref 0.7–4.0)
Lymphocytes Relative: 9 %
MCH: 28.3 pg (ref 26.0–34.0)
MCHC: 33 g/dL (ref 30.0–36.0)
MCV: 85.6 fL (ref 78.0–100.0)
Monocytes Absolute: 0.3 10*3/uL (ref 0.1–1.0)
Monocytes Relative: 3 %
NEUTROS PCT: 88 %
Neutro Abs: 7.2 10*3/uL (ref 1.7–7.7)
PLATELETS: 267 10*3/uL (ref 150–400)
RBC: 3.89 MIL/uL — AB (ref 4.22–5.81)
RDW: 15.6 % — ABNORMAL HIGH (ref 11.5–15.5)
WBC: 8.2 10*3/uL (ref 4.0–10.5)

## 2016-07-10 LAB — I-STAT TROPONIN, ED: TROPONIN I, POC: 0.04 ng/mL (ref 0.00–0.08)

## 2016-07-10 LAB — BASIC METABOLIC PANEL
Anion gap: 14 (ref 5–15)
BUN: 59 mg/dL — AB (ref 6–20)
CALCIUM: 8.5 mg/dL — AB (ref 8.9–10.3)
CO2: 24 mmol/L (ref 22–32)
Chloride: 97 mmol/L — ABNORMAL LOW (ref 101–111)
Creatinine, Ser: 14.48 mg/dL — ABNORMAL HIGH (ref 0.61–1.24)
GFR calc Af Amer: 4 mL/min — ABNORMAL LOW (ref >60)
GFR calc non Af Amer: 3 mL/min — ABNORMAL LOW (ref >60)
GLUCOSE: 86 mg/dL (ref 65–99)
Potassium: 5.4 mmol/L — ABNORMAL HIGH (ref 3.5–5.1)
Sodium: 135 mmol/L (ref 135–145)

## 2016-07-10 LAB — I-STAT CG4 LACTIC ACID, ED
LACTIC ACID, VENOUS: 1.64 mmol/L (ref 0.5–1.9)
Lactic Acid, Venous: 1.54 mmol/L (ref 0.5–1.9)

## 2016-07-10 LAB — MAGNESIUM: Magnesium: 2 mg/dL (ref 1.7–2.4)

## 2016-07-10 LAB — CBG MONITORING, ED: Glucose-Capillary: 114 mg/dL — ABNORMAL HIGH (ref 65–99)

## 2016-07-10 MED ORDER — SODIUM CHLORIDE 0.9 % IV SOLN
1.0000 g | Freq: Once | INTRAVENOUS | Status: AC
  Administered 2016-07-10: 1 g via INTRAVENOUS
  Filled 2016-07-10 (×2): qty 10

## 2016-07-10 MED ORDER — SODIUM BICARBONATE 8.4 % IV SOLN
Freq: Once | INTRAVENOUS | Status: AC
  Administered 2016-07-10: 17:00:00 via INTRAVENOUS
  Filled 2016-07-10: qty 100

## 2016-07-10 MED ORDER — ALBUTEROL SULFATE (2.5 MG/3ML) 0.083% IN NEBU
10.0000 mg | INHALATION_SOLUTION | Freq: Once | RESPIRATORY_TRACT | Status: AC
  Administered 2016-07-10: 10 mg via RESPIRATORY_TRACT
  Filled 2016-07-10: qty 12

## 2016-07-10 MED ORDER — SODIUM CHLORIDE 0.9 % IV SOLN: 100.0000 mL | INTRAVENOUS | Status: DC | PRN

## 2016-07-10 MED ORDER — HEPARIN SODIUM (PORCINE) 1000 UNIT/ML DIALYSIS: 2000.0000 [IU] | INTRAMUSCULAR | Status: DC | PRN

## 2016-07-10 MED ORDER — ALTEPLASE 2 MG IJ SOLR
2.0000 mg | Freq: Once | INTRAMUSCULAR | Status: DC | PRN
Start: 2016-07-10 — End: 2016-07-11

## 2016-07-10 MED ORDER — LIDOCAINE-PRILOCAINE 2.5-2.5 % EX CREA: 1.0000 "application " | TOPICAL_CREAM | CUTANEOUS | Status: DC | PRN

## 2016-07-10 MED ORDER — PENTAFLUOROPROP-TETRAFLUOROETH EX AERO: 1.0000 "application " | INHALATION_SPRAY | CUTANEOUS | Status: DC | PRN

## 2016-07-10 MED ORDER — LIDOCAINE HCL (PF) 1 % IJ SOLN: 5.0000 mL | INTRAMUSCULAR | Status: DC | PRN

## 2016-07-10 MED ORDER — ONDANSETRON HCL 4 MG PO TABS: 4.0000 mg | ORAL_TABLET | Freq: Four times a day (QID) | ORAL | Status: DC | PRN

## 2016-07-10 MED ORDER — CALCIUM GLUCONATE 10 % IV SOLN: 1.0000 g | Freq: Once | INTRAVENOUS | Status: DC

## 2016-07-10 MED ORDER — OXYCODONE HCL 5 MG PO TABS
ORAL_TABLET | ORAL | Status: AC
  Filled 2016-07-10: qty 2

## 2016-07-10 MED ORDER — INSULIN ASPART 100 UNIT/ML IV SOLN
10.0000 [IU] | Freq: Once | INTRAVENOUS | Status: AC
  Administered 2016-07-10: 10 [IU] via INTRAVENOUS
  Filled 2016-07-10: qty 0.1

## 2016-07-10 MED ORDER — HEPARIN SODIUM (PORCINE) 1000 UNIT/ML DIALYSIS: 1000.0000 [IU] | INTRAMUSCULAR | Status: DC | PRN

## 2016-07-10 MED ORDER — OXYCODONE HCL 5 MG PO TABS
10.0000 mg | ORAL_TABLET | Freq: Once | ORAL | Status: AC
  Administered 2016-07-10: 10 mg via ORAL

## 2016-07-10 MED ORDER — ONDANSETRON HCL 4 MG/2ML IJ SOLN
4.0000 mg | Freq: Four times a day (QID) | INTRAMUSCULAR | Status: DC | PRN
  Administered 2016-07-10: 4 mg via INTRAVENOUS
  Filled 2016-07-10: qty 2

## 2016-07-10 MED ORDER — DEXTROSE 10 % IV SOLN: Freq: Once | INTRAVENOUS | Status: DC

## 2016-07-10 MED ORDER — DEXTROSE 50 % IV SOLN
1.0000 | Freq: Once | INTRAVENOUS | Status: AC
  Administered 2016-07-10: 50 mL via INTRAVENOUS
  Filled 2016-07-10: qty 50

## 2016-07-10 MED ORDER — SODIUM BICARBONATE 8.4 % IV SOLN
50.0000 meq | Freq: Once | INTRAVENOUS | Status: AC
  Administered 2016-07-10: 50 meq via INTRAVENOUS
  Filled 2016-07-10: qty 50

## 2016-07-10 NOTE — ED Notes (Signed)
Dialysis unit called this RN and ask to call for immediate transfer despite no bed assignment at [facility].  Spoke to [facility], they report no truck available for transport and suggested to call EMS services.

## 2016-07-10 NOTE — Progress Notes (Signed)
History and Physical  YUVAAN OLANDER DXI:[id number] DOB: Feb 15, 1966 DOA: 07/10/2016   PCP: Angelica Chessman, MD    Patient coming from: Home  Chief Complaint: generalized weakness, fatigue, dizziness  HPI:  Frank [name] is a 50 y.o. male with medical history of 50 y.o. male with medical history significant for end-stage renal disease on hemodialysis, history of DVT and PE, nonischemic cardiomyopathy, hypertension, GERD, and nonadherence with his treatment plan who presents to the emergency department with nausea, generalized malaise, dizziness, fatigue and palpitations that began just prior to arrival. Patient has been dependent on the hospital for his hemodialysis and was dialyzed 07/08/2016 in the hospital.  The patient is supposed to be on warfarin, but is noncompliant and has had numerous subtherapeutic INRs. The patient had his tunneled dialysis catheter removed at [facility] on 07/09/2016. He complains of pain at the site without any bloody drainage or purulent drainage. He actually arrived at [facility] earlier in the day on 07/10/2016 for his scheduled dialysis,  but according to the patient he states that he was told that he needs to go to his established dialysis Center in [place] for his scheduled dialysis on 07/11/2016.  Subsequently later in the afternoon, he developed the above symptoms and came to the emergency department at  [facility].   Upon arrival to the ED, patient is found to be saturating well on room air, bradycardic in the high low 40s, and with soft but stable blood pressure.  EKG showed features of sinus bradycardia arrhythmia with heart rate 44, left anterior fascicular block, and LVH with repolarization abnormality. Chemistry panel reveals a potassium of >7.5 with mild hyponatremia. Nephrology was consulted by the ED physician and is evaluating the patient currently in the emergency department. A medical admission has been requested by nephrology and the patient will be  admitted.  The patient has had a very similar presentation for his last 2 admissions on 06/24/2016 and on 06/22/2016. The patient will be transferred to [facility] for emergent dialysis.  In the emergency department, the patient was given bicarbonate ampule, D5, insulin, calcium gluconate, and subsequent was started on bicarbonate drip. Assessment/Plan:  1. Hyperkalemia with bradyarrhythmia  - Pt presents with dizziness, palpitations and fatigue and found to have potassium of >7.5 with bradyarrhythmia rate in low 40s - Temporizing measures given in ED and HR is coming up to 60s in ED  - Nephrology consulted ED - pt to transfer to [facility] HD unit for emergent HD - pt offered kayexalate, but refuses - Plan to monitor on telemetry   2. ESRD on HD  - Pt has established with outpatient HD clinic in [place] - He has AVF that has matured - [facility] was removed 12/19 - Nephrology has arranged for HD  3. Hx of DVT, PE  - There is no evidence of acute VTE  - Pt is prescribed warfarin, but is not adherent--numerous subtherapeutic INRs  -  History of remote PE/DVT in 2015 and 2016, this is likely related to his dialysis catheter. - d/c warfarin at this point now that [facility] has been removed  4. Hypertension  - He is managed at home with Norvasc, clonidine, Coreg, hydralazine; -holding coreg due to bradycardia--evaluate for restart 12/21  5. Normocytic anemia  - Hgb is 11.0 on admission and apparently stable with no evidence for bleeding   6. GERD - No EGD report on file  - Managed with Pepcid and Protonix at home and these  will be continued    7. Nonischemic cardiomyopathy - Last EF measured in April 2017 was 35-40%  - Volume management per nephrology.       Past Medical History:  Diagnosis Date  . Anemia   . Anxiety   . Chronic combined systolic and diastolic CHF (congestive heart failure) (HCC)    a. EF 20-25% by echo in 08/2015 b. echo 10/2015: EF 35-40%, diffuse HK, severe  LAE, moderate RAE, small pericardial effusion  . Complication of anesthesia    itching, sore throat  . Depression   . Dialysis patient ([place])   . ESRD (end stage renal disease) ([place])    due to HTN per patient, followed at [facility], s/p failed kidney transplant - dialysis Tue, Th, Sat  . Hyperkalemia 12/2015  . Hypertension   . Junctional rhythm    a. noted in 08/2015: hyperkalemic at that time  b. 12/2015: presented in junctional rhythm w/ K+ of 6.6. Resolved with improvement of K+ levels.  . Nonischemic cardiomyopathy ([place])    a. 08/2014: cath showing minimal CAD, but tortuous arteries noted.   . Personal history of DVT (deep vein thrombosis)/ PE 05/26/2016   In Oct 2015 had small subsemental LUL PE w/o DVT (LE dopplers neg) and was felt to be HD cath related, treated w coumadin.  IN May 2016 had small vein DVT (acute/subacute) in the R basilic/ brachial veins of the RUE, resumed on coumadin.  Had R sided HD cath at that time.    . Renal insufficiency   . Shortness of breath   . Type II diabetes mellitus (HCC)    No history per patient, but remains under history as A1c would not be accurate given on dialysis   Past Surgical History:  Procedure Laterality Date  . CAPD INSERTION    . CAPD REMOVAL    . INGUINAL HERNIA REPAIR Right 02/14/2015   Procedure: REPAIR INCARCERATED RIGHT INGUINAL HERNIA;  Surgeon: [name], MD;  Location: [place];  Service: General;  Laterality: Right;  . INSERTION OF DIALYSIS CATHETER Right 09/23/2015   Procedure: exchange of Right internal Dialysis Catheter.;  Surgeon: [name], MD;  Location: [place];  Service: Vascular;  Laterality: Right;  . KIDNEY RECEIPIENT  2006   failed and started HD in March 2014  . LEFT HEART CATHETERIZATION WITH CORONARY ANGIOGRAM N/A 09/02/2014   Procedure: LEFT HEART CATHETERIZATION WITH CORONARY ANGIOGRAM;  Surgeon: [name], MD;  Location: [facility] CATH LAB;  Service: Cardiovascular;  Laterality: N/A;   Social History:  reports  that he has quit smoking. His smoking use included Cigarettes. He smoked 0.00 packs per day for 1.00 year. He has never used smokeless tobacco. He reports that he does not drink alcohol or use drugs.   Family History  Problem Relation Age of Onset  . Hypertension Other      Allergies  Allergen Reactions  . Butalbital-Apap-Caffeine Shortness Of Breath, Swelling and Other (See Comments)    Swelling in throat  . Ferrlecit [Na Ferric Gluc Cplx In Sucrose] Shortness Of Breath, Swelling and Other (See Comments)    Swelling in throat  . Minoxidil Shortness Of Breath  . Tylenol [Acetaminophen] Anaphylaxis and Swelling  . Darvocet [Propoxyphene N-Acetaminophen] Hives     Prior to Admission medications   Medication Sig Start Date End Date Taking? Authorizing Provider  allopurinol (ZYLOPRIM) 100 MG tablet Take 100 mg by mouth daily.    Historical Provider, MD  amLODipine (NORVASC) 10 MG tablet Take  10 mg by mouth 2 (two) times daily.     Historical Provider, MD  atorvastatin (LIPITOR) 40 MG tablet Take 1 tablet (40 mg total) by mouth daily at 6 PM. 09/02/14   Barton Dubois, MD  carvedilol (COREG) 25 MG tablet Take 25 mg by mouth 2 (two) times daily with a meal.    Historical Provider, MD  cinacalcet (SENSIPAR) 30 MG tablet Take 30 mg by mouth every evening.     Historical Provider, MD  cloNIDine (CATAPRES - DOSED IN MG/24 HR) 0.3 mg/24hr patch Place 0.3 mg onto the skin once a week.    Historical Provider, MD  doxazosin (CARDURA) 2 MG tablet Take 2 mg by mouth daily.    Historical Provider, MD  famotidine (PEPCID) 20 MG tablet Take 1 tablet (20 mg total) by mouth 2 (two) times daily. 02/13/16   Charlesetta Shanks, MD  gabapentin (NEURONTIN) 100 MG capsule Take 1 capsule (100 mg total) by mouth at bedtime. Patient taking differently: Take 100 mg by mouth 3 (three) times daily.  05/21/16   [name], DO  hydrALAZINE (APRESOLINE) 100 MG tablet Take 100 mg by mouth 2 (two) times daily.      Historical Provider, MD  isosorbide mononitrate (IMDUR) 60 MG 24 hr tablet Take 1 tablet (60 mg total) by mouth daily. 01/09/16   Smiley Houseman, MD  omeprazole (PRILOSEC) 20 MG capsule Take 20 mg by mouth daily. 07/01/13   Historical Provider, MD  ondansetron (ZOFRAN ODT) 4 MG disintegrating tablet Take 1 tablet (4 mg total) by mouth every 8 (eight) hours as needed for nausea or vomiting. 05/16/16   Sherwood Gambler, MD  oxycodone (OXY-IR) 5 MG capsule Take 1 capsule (5 mg total) by mouth every 4 (four) hours as needed for pain. 05/30/16   [name], MD  oxyCODONE-acetaminophen (PERCOCET/ROXICET) 5-325 MG tablet Take 1 tablet by mouth every 6 (six) hours as needed for severe pain.    Historical Provider, MD  predniSONE (DELTASONE) 10 MG tablet Take 10 mg by mouth daily with breakfast.    Historical Provider, MD  sevelamer carbonate (RENVELA) 800 MG tablet Take 1,600 mg by mouth 3 (three) times daily with meals.     Historical Provider, MD  warfarin (COUMADIN) 2 MG tablet Take 2 mg by mouth daily. Take with 6m tablet to equal 757m7/12/17   Historical Provider, MD  warfarin (COUMADIN) 5 MG tablet Take 5 mg by mouth See admin instructions. Take 1 tablet (5 mg) by mouth with a 2 mg tablet for a 7 mg dose 01/31/16   Historical Provider, MD    Review of Systems:  Constitutional:  No weight loss, night sweats, Fevers, chills Head&Eyes: No headache.  No vision loss.  No eye pain or scotoma ENT:  No Difficulty swallowing,Tooth/dental problems,Sore throat,  No ear ache, post nasal drip,  Cardio-vascular:  No chest pain, Orthopnea, PND, swelling in lower extremities,  dizziness, palpitations  GI:  No  abdominal pain,diarrhea, loss of appetite, hematochezia, melena, heartburn, indigestion, Resp:  No shortness of breath with exertion or at rest. No cough. No coughing up of blood .No wheezing.No chest wall deformity  Skin:  no rash or lesions.  GU:  no dysuria, change in color of urine, no  urgency or frequency. No flank pain.  Musculoskeletal:  No joint pain or swelling. No decreased range of motion. No back pain.  Psych:  No change in mood or affect. No depression or anxiety. Neurologic: No headache, no  dysesthesia, no focal weakness, no vision loss. No syncope  Physical Exam: Vitals:   07/10/16 1457 07/10/16 1521 07/10/16 1602 07/10/16 1631  BP: 138/86 136/83 145/87   Pulse: (!) 43  (!) 52 65  Resp:  _0 SpO2: 96%  100% 100%   General:  A&O x 3, NAD, nontoxic, pleasant/cooperative Head/Eye: No conjunctival hemorrhage, no icterus, [place]/AT, No nystagmus ENT:  No icterus,  No thrush, good dentition, no pharyngeal exudate;  old dialysis catheter site and neck without any drainage, erythema, crepitance, edema, induration Neck:  No masses, no lymphadenpathy, no bruits CV:  RRR, no rub, no gallop, no S3 Lung: Diminished breath sounds at the bases, but CTAB, good air movement, no wheeze, no rhonchi Abdomen: soft/NT, +BS, nondistended, no peritoneal signs Ext: No cyanosis, No rashes, No petechiae, No lymphangitis, No edema Neuro: CNII-XII intact, strength 4/5 in bilateral upper and lower extremities, no dysmetria  Labs on Admission:  Basic Metabolic Panel:  Recent Labs Lab 07/04/16 2318 07/06/16 0009 07/06/16 0829 07/08/16 0426 07/10/16 1520  NA 138 138 138 137 131*  K 5.2* 5.9* 6.6* 5.8* >7.5*  CL 97* 100* 96* 103 95*  CO2 28  --  26  --  23  GLUCOSE 131* 97 81 72 107*  BUN 56* 65* 74* 52* 56*  CREATININE 13.80* 16.80* 17.28* 15.60* 14.16*  CALCIUM 8.7*  --  8.1*  --  8.3*  MG  --   --   --   --  2.0  PHOS  --   --  7.8*  --   --    Liver Function Tests:  Recent Labs Lab 07/04/16 2318 07/06/16 0829 07/10/16 1520  AST 24  --  28  ALT 11*  --  11*  ALKPHOS 184*  --  172*  BILITOT 0.5  --  1.4*  PROT 7.8  --  8.0  ALBUMIN 3.4* 3.1* 3.6   No results for input(s): LIPASE, AMYLASE in the last 168 hours. No results for input(s): AMMONIA in the last  168 hours. CBC:  Recent Labs Lab 07/04/16 2318 07/06/16 0009 07/06/16 0829 07/08/16 0426 07/10/16 1520  WBC 5.5  --  5.8  --  8.2  NEUTROABS 3.2  --   --   --  7.2  HGB 11.0* 10.2* 9.7* 12.6* 11.0*  HCT 34.4* 30.0* 30.7* 37.0* 33.3*  MCV 87.5  --  86.0  --  85.6  PLT 206  --  205  --  267   Coagulation Profile:  Recent Labs Lab 07/10/16 1520  INR 1.07   Cardiac Enzymes: No results for input(s): CKTOTAL, CKMB, CKMBINDEX, TROPONINI in the last 168 hours. BNP: Invalid input(s): POCBNP CBG: No results for input(s): GLUCAP in the last 168 hours. Urine analysis:    Component Value Date/Time   COLORURINE YELLOW 10/18/2013 [place] 10/18/2013 0419   LABSPEC 1.008 10/18/2013 0419   PHURINE 8.5 (H) 10/18/2013 0419   GLUCOSEU 100 (A) 10/18/2013 0419   HGBUR TRACE (A) 10/18/2013 0419   BILIRUBINUR NEGATIVE 10/18/2013 0419   KETONESUR NEGATIVE 10/18/2013 0419   PROTEINUR 100 (A) 10/18/2013 0419   UROBILINOGEN 0.2 10/18/2013 0419   NITRITE NEGATIVE 10/18/2013 0419   LEUKOCYTESUR NEGATIVE 10/18/2013 0419   Sepsis Labs: _1 (procalcitonin:4,lacticidven:4) )No results found for this or any previous visit (from the past 240 hour(s)).   Radiological Exams on Admission: Dg Chest Portable 1 View  Result Date: 07/10/2016 CLINICAL DATA:  Right side chest pain EXAM: PORTABLE  CHEST 1 VIEW COMPARISON:  06/09/2016 FINDINGS: Cardiomegaly is noted. No infiltrate or pleural effusion. No pulmonary edema. IMPRESSION: Cardiomegaly.  No active disease. Electronically Signed   By: Lahoma Crocker M.D.   On: 07/10/2016 16:00    EKG: Independently reviewed. Sinus bradycardia, peaked T waves, nonspecific ST depression.    Time spent:60 minutes Code Status:   FULL Family Communication:  No Family at bedside Disposition Plan: expect 2 day hospitalization Consults called: nephrology by ED DVT Prophylaxis: Heparin  [place]  Aleksi Brummet, DO  [facility] Pager  [phone]  If 7PM-7AM, please contact night-coverage [email or web address] Password [email or web address] 07/10/2016, 5:15 PM

## 2016-07-10 NOTE — ED Notes (Signed)
Per Admitting MD, Pt needs to emergently go to dialysis at [facility].  He is aware the Pt does not have a room.  [facility] unavailable and EMS contacted.

## 2016-07-10 NOTE — ED Notes (Signed)
MD aware of I-STAT Chem 8 results

## 2016-07-10 NOTE — ED Triage Notes (Signed)
Pt called out in parking lot for assistance out of his truck, staff had to physically pick up pt stating he has low heart rate. Reports high potassium. Pt dialysis pt. C/o emesis.

## 2016-07-10 NOTE — Consult Note (Addendum)
Reason for Consult: Hyperkalemia Referring Physician:  Dr. Shanon Brow Tat  Chief Complaint: Weakness  Assessment/Plan: 1 Hyperkalemia - w/ symptomatic bradycardia -> will give an emergent HD treatment tonight w/ 0K and then 1K bath. We will also check a repeat chemistry panel prior to dialysis. Initial K was greater than 7.5 and normal HR now is a result of K shift but he is still going to be total body potassium positive/surplus requiring dialysis to remove. 2 ESRD: He has been accepted for outpt dialysis @ [facility] by Dr. Florene Glen given that he now has a fistula; pt had been refusing permanent access placement before. Will repeat chemistry panel in the AM and if K is still high then will prescribe another tx prior to discharge. 3 Hypertension: Controlled. 4. Anemia of ESRD: Stable 5. Metabolic Bone Disease: Will manage iPTH as an outpt. Check phos in the AM. 6. Hx of DVT PE - on coumadin w/ subtherapeutic INR's. Coumadin d/c by primary given TDC is now removed as well as [place]. 7. NICM - last EF 35-40%. Volume status adequate.  Seen on dialysis on 07/10/2016 @ 850pm. 1K bath for 2 hrs, req a stat chem panel and will also req a 2hr into treatment iSTAT K. Symptomatically the pt is hyperkalemic with muscle weakness and normal K is bec of intracellular shifts. Stop the bicarb gtt. He is tolerating treatment with no complaints.  Primary Nephrologist Dr. Florene Glen Accepted at but has yet to be initiated @ [facility] EDW ~160 lb Access lt ulnar basilic  HPI: Frank [name] is a 50 y.o. male with ESRD x18years who has been coming to [facility] for HD bec of non compliance as well as prior refusal for having a permanent access placement. He has since had a left ulnar basilic placed in the forearm with a RIJ cath removal at [facility] this week which was incredibly difficult requiring  Intraluminal ballooning of the catheter before it could be removed. He also had bleeding from a vein in the thorax which was  controlled with prolonged gentle 65m balloon inflation. He is being admitted bec of symptomatic bradycardia w/ rates noted to bein in the low 40's; he had to be helped from his truck in the parking lot. Potassium was noted to be >7.5 w/ no widening of the QRS complex. He was given an amp of bicarb, bicarb gtt started, D5, 10 units of insulin and CaGluconate. He has been feeling fatigued w/ lightheadedness but no loss of consciousness. He denies cp or dyspnea but has right neck pain since the cath was removed (not any worse). He was actually prescribed oxycodone #15 PRN bec of the neck pain. He also has a sore throat for 1 day but denies fever, chills or cough.   Past Medical History:  Diagnosis Date  . Anemia   . Anxiety   . Chronic combined systolic and diastolic CHF (congestive heart failure) (HCC)    a. EF 20-25% by echo in 08/2015 b. echo 10/2015: EF 35-40%, diffuse HK, severe LAE, moderate RAE, small pericardial effusion  . Complication of anesthesia    itching, sore throat  . Depression   [place]   . [place] ([place] renal disease) [place]    due to HTN per patient, [facility] - dialysis Tue, Th, Sat  . Hyperkalemia 12/2015  . Hypertension   . Junctional rhythm    a. noted in 08/2015: hyperkalemic at that time  b. 12/2015: presented  in junctional rhythm w/ K+ of 6.6. Resolved with improvement of K+ levels.  . Nonischemic cardiomyopathy ([place])    a. 08/2014: cath showing minimal CAD, but tortuous arteries noted.   . Personal history of DVT (deep vein thrombosis)/ PE 05/26/2016   In Oct 2015 had small subsemental LUL PE w/o DVT (LE dopplers neg) and was felt to be HD cath related, treated w coumadin.  IN May 2016 had small vein DVT (acute/subacute) in the R basilic/ brachial veins of the RUE, resumed on coumadin.  Had R sided HD cath at that time.    . Renal insufficiency   . Shortness of breath   . Type II diabetes mellitus (HCC)    No  history per patient, but remains under history as A1c would not be accurate given on dialysis     Allergies:  Allergies  Allergen Reactions  . Butalbital-Apap-Caffeine Shortness Of Breath, Swelling and Other (See Comments)    Swelling in throat  . Ferrlecit [Na Ferric Gluc Cplx In Sucrose] Shortness Of Breath, Swelling and Other (See Comments)    Swelling in throat  . Minoxidil Shortness Of Breath  . Tylenol [Acetaminophen] Anaphylaxis and Swelling  . Darvocet [Propoxyphene N-Acetaminophen] Hives    Past Surgical History:  Procedure Laterality Date  . CAPD INSERTION    . CAPD REMOVAL    . INGUINAL HERNIA REPAIR Right 02/14/2015   Procedure: REPAIR INCARCERATED RIGHT INGUINAL HERNIA;  Surgeon: [name], MD;  Location: [facility];  Service: General;  Laterality: Right;  . INSERTION OF DIALYSIS CATHETER Right 09/23/2015   Procedure: exchange of Right internal Dialysis Catheter.;  Surgeon: [name], MD;  Location: [facility];  Service: Vascular;  Laterality: Right;  . KIDNEY RECEIPIENT  2006   failed and started HD in March 2014  . LEFT HEART CATHETERIZATION WITH CORONARY ANGIOGRAM N/A 09/02/2014   Procedure: LEFT HEART CATHETERIZATION WITH CORONARY ANGIOGRAM;  Surgeon: [name], MD;  Location: [facility] CATH LAB;  Service: Cardiovascular;  Laterality: N/A;    Family History  Problem Relation Age of Onset  . Hypertension Other     Social History:  reports that he has quit smoking. His smoking use included Cigarettes. He smoked 0.00 packs per day for 1.00 year. He has never used smokeless tobacco. He reports that he does not drink alcohol or use drugs.  ROS Pertinent items are noted in HPI.  Blood pressure 149/87, pulse 98, resp. rate 18, SpO2 96 %. General appearance: alert and cooperative Eyes: negative Neck: no adenopathy, no carotid bruit, no JVD, supple, symmetrical, trachea midline and thyroid not enlarged, symmetric, no tenderness/mass/nodules Back: symmetric, no curvature.  ROM normal. No CVA tenderness. Resp: clear to auscultation bilaterally Chest wall: no tenderness Cardio: regular rate and rhythm, S1, S2 normal, no murmur, click, rub or gallop GI: soft, non-tender; bowel sounds normal; no masses,  no organomegaly Extremities: extremities normal, atraumatic, no cyanosis or edema Pulses: 2+ and symmetric Skin: Skin color, texture, turgor normal. No rashes or lesions Lymph nodes: Cervical, supraclavicular, and axillary nodes normal. Neurologic: Grossly normal  Results for orders placed or performed during the hospital encounter of 07/10/16 (from the past 48 hour(s))  CBC with Differential     Status: Abnormal   Collection Time: 07/10/16  3:20 PM  Result Value Ref Range   WBC 8.2 4.0 - 10.5 K/uL   RBC 3.89 (L) 4.22 - 5.81 MIL/uL   Hemoglobin 11.0 (L) 13.0 - 17.0 g/dL   HCT 33.3 (L) 39.0 -  52.0 %   MCV 85.6 78.0 - 100.0 fL   MCH 28.3 26.0 - 34.0 pg   MCHC 33.0 30.0 - 36.0 g/dL   RDW 15.6 (H) 11.5 - 15.5 %   Platelets 267 150 - 400 K/uL   Neutrophils Relative % 88 %   Neutro Abs 7.2 1.7 - 7.7 K/uL   Lymphocytes Relative 9 %   Lymphs Abs 0.7 0.7 - 4.0 K/uL   Monocytes Relative 3 %   Monocytes Absolute 0.3 0.1 - 1.0 K/uL   Eosinophils Relative 0 %   Eosinophils Absolute 0.0 0.0 - 0.7 K/uL   Basophils Relative 0 %   Basophils Absolute 0.0 0.0 - 0.1 K/uL  Comprehensive metabolic panel     Status: Abnormal   Collection Time: 07/10/16  3:20 PM  Result Value Ref Range   Sodium 131 (L) 135 - 145 mmol/L   Potassium >7.5 (HH) 3.5 - 5.1 mmol/L    Comment: REPEATED TO VERIFY CRITICAL RESULT CALLED TO, READ BACK BY AND VERIFIED WITH: Orpah Melter. AT 1648 07/10/16 BY THOMPSON,N. SLIGHT HEMOLYSIS    Chloride 95 (L) 101 - 111 mmol/L   CO2 23 22 - 32 mmol/L   Glucose, Bld 107 (H) 65 - 99 mg/dL   BUN 56 (H) 6 - 20 mg/dL   Creatinine, Ser 14.16 (H) 0.61 - 1.24 mg/dL   Calcium 8.3 (L) 8.9 - 10.3 mg/dL   Total Protein 8.0 6.5 - 8.1 g/dL   Albumin 3.6 3.5 -  5.0 g/dL   AST 28 15 - 41 U/L   ALT 11 (L) 17 - 63 U/L   Alkaline Phosphatase 172 (H) 38 - 126 U/L   Total Bilirubin 1.4 (H) 0.3 - 1.2 mg/dL   GFR calc non Af Amer 3 (L) >60 mL/min   GFR calc Af Amer 4 (L) >60 mL/min    Comment: (NOTE) The eGFR has been calculated using the CKD EPI equation. This calculation has not been validated in all clinical situations. eGFR's persistently <60 mL/min signify possible Chronic Kidney Disease.    Anion gap 13 5 - 15  Magnesium     Status: None   Collection Time: 07/10/16  3:20 PM  Result Value Ref Range   Magnesium 2.0 1.7 - 2.4 mg/dL  Protime-INR     Status: None   Collection Time: 07/10/16  3:20 PM  Result Value Ref Range   Prothrombin Time 13.9 11.4 - 15.2 seconds   INR 1.07   I-Stat Troponin, ED (not at [facility])     Status: None   Collection Time: 07/10/16  3:26 PM  Result Value Ref Range   Troponin i, poc 0.04 0.00 - 0.08 ng/mL   Comment 3            Comment: Due to the release kinetics of cTnI, a negative result within the first hours of the onset of symptoms does not rule out myocardial infarction with certainty. If myocardial infarction is still suspected, repeat the test at appropriate intervals.   I-Stat CG4 Lactic Acid, ED     Status: None   Collection Time: 07/10/16  3:28 PM  Result Value Ref Range   Lactic Acid, Venous 1.64 0.5 - 1.9 mmol/L  CBG monitoring, ED     Status: Abnormal   Collection Time: 07/10/16  5:19 PM  Result Value Ref Range   Glucose-Capillary 114 (H) 65 - 99 mg/dL  Basic metabolic panel     Status: Abnormal   Collection Time: 07/10/16  6:01  PM  Result Value Ref Range   Sodium 135 135 - 145 mmol/L   Potassium 5.4 (H) 3.5 - 5.1 mmol/L    Comment: DELTA CHECK NOTED REPEATED TO VERIFY    Chloride 97 (L) 101 - 111 mmol/L   CO2 24 22 - 32 mmol/L   Glucose, Bld 86 65 - 99 mg/dL   BUN 59 (H) 6 - 20 mg/dL   Creatinine, Ser 14.48 (H) 0.61 - 1.24 mg/dL   Calcium 8.5 (L) 8.9 - 10.3 mg/dL   GFR calc non Af Amer  3 (L) >60 mL/min   GFR calc Af Amer 4 (L) >60 mL/min    Comment: (NOTE) The eGFR has been calculated using the CKD EPI equation. This calculation has not been validated in all clinical situations. eGFR's persistently <60 mL/min signify possible Chronic Kidney Disease.    Anion gap 14 5 - 15  I-Stat CG4 Lactic Acid, ED     Status: None   Collection Time: 07/10/16  6:12 PM  Result Value Ref Range   Lactic Acid, Venous 1.54 0.5 - 1.9 mmol/L    Dg Chest Portable 1 View  Result Date: 07/10/2016 CLINICAL DATA:  Right side chest pain EXAM: PORTABLE CHEST 1 VIEW COMPARISON:  06/09/2016 FINDINGS: Cardiomegaly is noted. No infiltrate or pleural effusion. No pulmonary edema. IMPRESSION: Cardiomegaly.  No active disease. Electronically Signed   By: Lahoma Crocker M.D.   On: 07/10/2016 16:00      Dwana Melena, MD 07/10/2016, 7:02 PM

## 2016-07-10 NOTE — ED Provider Notes (Signed)
[facility]-EMERGENCY DEPT Provider Note   CSN: [id number] Arrival date & time: 07/10/16  1453     History   Chief Complaint Chief Complaint  Patient presents with  . Fatigue    HPI Frank [name] is a 50 y.o. male With a past medical history significant for ESR D on dialysis, hypertension, prior DVT, and diabetes who presents with profound fatigue, generalized weakness, generalized tingling, right-sided neck and upper chest pain, and right-sided headache. Patient reports that this is how he is felt when he has been hyperkalemic in the past. Patient reports that he recently had his dialysis catheter removed from his right chest yesterday and has been having pain at that site sense. He says the pain is extending up into his right neck and into his head. He denies any drainage from the site, any redness, or any signs of infection. He denies nausea, vomiting, constipation, diarrhea. He says that he saw his nephrologist today and is scheduled to change dialysis centers to [place] tomorrow. He says that while going home, he was overcome with the fatigue prompting him to go to the nearest ED.    The history is provided by the patient and medical records. No language interpreter was used.  Dizziness  Quality:  Lightheadedness Severity:  Severe Onset quality:  Sudden Duration:  1 day Timing:  Constant Progression:  Worsening Chronicity:  Recurrent Context: physical activity   Relieved by:  Lying down Worsened by:  Standing up Ineffective treatments:  Lying down Associated symptoms: chest pain (right sided), headaches (right sided), shortness of breath and weakness (all over)   Associated symptoms: no diarrhea, no nausea, no palpitations, no syncope, no vision changes and no vomiting   Headaches:    Severity:  Moderate   Onset quality:  Gradual   Duration:  2 days   Timing:  Constant   Progression:  Worsening Shortness of breath:    Severity:  Moderate   Onset quality:  Gradual  Duration:  1 day   Timing:  Constant Weakness:    Severity:  Severe   Timing:  Constant   Progression:  Worsening   Chronicity:  Recurrent   Past Medical History:  Diagnosis Date  . Anemia   . Anxiety   . Chronic combined systolic and diastolic CHF (congestive heart failure) (HCC)    a. EF 20-25% by echo in 08/2015 b. echo 10/2015: EF 35-40%, diffuse HK, severe LAE, moderate RAE, small pericardial effusion  . Complication of anesthesia    itching, sore throat  . Depression   . Dialysis patient (HCC)   . ESRD (end stage renal disease) (HCC)    due to HTN per patient, followed at [facility], s/p failed kidney transplant - dialysis Tue, Th, Sat  . Hyperkalemia 12/2015  . Hypertension   . Junctional rhythm    a. noted in 08/2015: hyperkalemic at that time  b. 12/2015: presented in junctional rhythm w/ K+ of 6.6. Resolved with improvement of K+ levels.  . Nonischemic cardiomyopathy (HCC)    a. 08/2014: cath showing minimal CAD, but tortuous arteries noted.   . Personal history of DVT (deep vein thrombosis)/ PE 05/26/2016   In Oct 2015 had small subsemental LUL PE w/o DVT (LE dopplers neg) and was felt to be HD cath related, treated w coumadin.  IN May 2016 had small vein DVT (acute/subacute) in the R basilic/ brachial veins of the RUE, resumed on coumadin.  Had R sided HD cath at that time.    .   Renal insufficiency   . Shortness of breath   . Type II diabetes mellitus (HCC)    No history per patient, but remains under history as A1c would not be accurate given on dialysis    Patient Active Problem List   Diagnosis Date Noted  . Anemia associated with chronic renal failure   . End-stage renal disease on hemodialysis (HCC)   . Subtherapeutic international normalized ratio (INR)   . Hypertension   . Dialysis patient, noncompliant (HCC) 06/22/2016  . ESRD (end stage renal disease) on dialysis (HCC)   . ESRD (end stage renal disease) (HCC) 06/20/2016  . ESRD needing dialysis (HCC)  06/14/2016  . Uremia 06/07/2016  . Hyperkalemia 05/29/2016  . Chronic back pain 05/29/2016  . GERD (gastroesophageal reflux disease) 05/29/2016  . Personal history of DVT (deep vein thrombosis)/ PE 05/26/2016  . Bradycardia 03/31/2016  . Nonischemic cardiomyopathy (HCC) 01/09/2016  . Bilateral low back pain without sciatica   . Left renal mass 10/30/2015  . Constipation 10/30/2015  . Adjustment disorder with mixed anxiety and depressed mood 08/20/2015  . Chronic epididymitis 06/19/2015  . Groin pain, chronic, right 06/19/2015  . Incarcerated right inguinal hernia 02/16/2015  . Essential hypertension 01/02/2015  . Dyslipidemia   . ESRD on hemodialysis (HCC)   . Anemia of chronic kidney failure 06/24/2013    Past Surgical History:  Procedure Laterality Date  . CAPD INSERTION    . CAPD REMOVAL    . INGUINAL HERNIA REPAIR Right 02/14/2015   Procedure: REPAIR INCARCERATED RIGHT INGUINAL HERNIA;  Surgeon: James Wyatt, MD;  Location: [facility] OR;  Service: General;  Laterality: Right;  . INSERTION OF DIALYSIS CATHETER Right 09/23/2015   Procedure: exchange of Right internal Dialysis Catheter.;  Surgeon: Vance W Brabham, MD;  Location: [facility] OR;  Service: Vascular;  Laterality: Right;  . KIDNEY RECEIPIENT  2006   failed and started HD in March 2014  . LEFT HEART CATHETERIZATION WITH CORONARY ANGIOGRAM N/A 09/02/2014   Procedure: LEFT HEART CATHETERIZATION WITH CORONARY ANGIOGRAM;  Surgeon: David W Harding, MD;  Location: [facility] CATH LAB;  Service: Cardiovascular;  Laterality: N/A;       Home Medications    Prior to Admission medications   Medication Sig Start Date End Date Taking? Authorizing Provider  allopurinol (ZYLOPRIM) 100 MG tablet Take 100 mg by mouth daily.    Historical Provider, MD  amLODipine (NORVASC) 10 MG tablet Take 10 mg by mouth 2 (two) times daily.     Historical Provider, MD  atorvastatin (LIPITOR) 40 MG tablet Take 1 tablet (40 mg total) by mouth daily at 6 PM. 09/02/14    Carlos Madera, MD  carvedilol (COREG) 25 MG tablet Take 25 mg by mouth 2 (two) times daily with a meal.    Historical Provider, MD  cinacalcet (SENSIPAR) 30 MG tablet Take 30 mg by mouth every evening.     Historical Provider, MD  cloNIDine (CATAPRES - DOSED IN MG/24 HR) 0.3 mg/24hr patch Place 0.3 mg onto the skin once a week.    Historical Provider, MD  doxazosin (CARDURA) 2 MG tablet Take 2 mg by mouth daily.    Historical Provider, MD  famotidine (PEPCID) 20 MG tablet Take 1 tablet (20 mg total) by mouth 2 (two) times daily. 02/13/16   Marcy Pfeiffer, MD  gabapentin (NEURONTIN) 100 MG capsule Take 1 capsule (100 mg total) by mouth at bedtime. Patient taking differently: Take 100 mg by mouth 3 (three) times daily.  05/21/16   Jessica   U Vann, DO  hydrALAZINE (APRESOLINE) 100 MG tablet Take 100 mg by mouth 2 (two) times daily.     Historical Provider, MD  isosorbide mononitrate (IMDUR) 60 MG 24 hr tablet Take 1 tablet (60 mg total) by mouth daily. 01/09/16   Kanishka G Gunadasa, MD  omeprazole (PRILOSEC) 20 MG capsule Take 20 mg by mouth daily. 07/01/13   Historical Provider, MD  ondansetron (ZOFRAN ODT) 4 MG disintegrating tablet Take 1 tablet (4 mg total) by mouth every 8 (eight) hours as needed for nausea or vomiting. 05/16/16   Scott Goldston, MD  oxycodone (OXY-IR) 5 MG capsule Take 1 capsule (5 mg total) by mouth every 4 (four) hours as needed for pain. 05/30/16   Alma M Devine, MD  oxyCODONE-acetaminophen (PERCOCET/ROXICET) 5-325 MG tablet Take 1 tablet by mouth every 6 (six) hours as needed for severe pain.    Historical Provider, MD  predniSONE (DELTASONE) 10 MG tablet Take 10 mg by mouth daily with breakfast.    Historical Provider, MD  sevelamer carbonate (RENVELA) 800 MG tablet Take 1,600 mg by mouth 3 (three) times daily with meals.     Historical Provider, MD  warfarin (COUMADIN) 2 MG tablet Take 2 mg by mouth daily. Take with 5mg tablet to equal 7mg 01/31/16   Historical Provider, MD    warfarin (COUMADIN) 5 MG tablet Take 5 mg by mouth See admin instructions. Take 1 tablet (5 mg) by mouth with a 2 mg tablet for a 7 mg dose 01/31/16   Historical Provider, MD    Family History Family History  Problem Relation Age of Onset  . Hypertension Other     Social History Social History  Substance Use Topics  . Smoking status: Former Smoker    Packs/day: 0.00    Years: 1.00    Types: Cigarettes  . Smokeless tobacco: Never Used     Comment: quit Jan 2014  . Alcohol use No     Allergies   Butalbital-apap-caffeine; Ferrlecit [na ferric gluc cplx in sucrose]; Minoxidil; Tylenol [acetaminophen]; and Darvocet [propoxyphene n-acetaminophen]   Review of Systems Review of Systems  Constitutional: Positive for fatigue. Negative for chills and diaphoresis.  HENT: Negative for congestion.   Eyes: Negative for visual disturbance.  Respiratory: Positive for chest tightness and shortness of breath. Negative for cough, wheezing and stridor.   Cardiovascular: Positive for chest pain (right sided). Negative for palpitations, leg swelling and syncope.  Gastrointestinal: Negative for abdominal pain, constipation, diarrhea, nausea and vomiting.  Musculoskeletal: Positive for neck pain (right sided). Negative for back pain and neck stiffness.  Skin: Positive for wound (right chest catheter removal site). Negative for rash.  Neurological: Positive for weakness (all over), light-headedness and headaches (right sided). Negative for dizziness, facial asymmetry and numbness.  Psychiatric/Behavioral: Negative for agitation and confusion.  All other systems reviewed and are negative.    Physical Exam Updated Vital Signs BP 138/86 (BP Location: Right Arm)   Pulse (!) 43   SpO2 96%   Physical Exam  Constitutional: He is oriented to person, place, and time. He appears well-developed and well-nourished.  HENT:  Head: Normocephalic and atraumatic.  Eyes: Conjunctivae are normal.  Neck:  Neck supple. Muscular tenderness present. No spinous process tenderness present. No neck rigidity. No edema, no erythema and normal range of motion present.    Cardiovascular: Regular rhythm.  Bradycardia present.   No murmur heard. Pulmonary/Chest: Effort normal and breath sounds normal. No respiratory distress. He exhibits tenderness. He exhibits no   crepitus, no edema, no swelling and no retraction.    Abdominal: Soft. There is no tenderness.  Musculoskeletal: He exhibits no edema.  Neurological: He is alert and oriented to person, place, and time. He is not disoriented. He displays no tremor. A sensory deficit is present. No cranial nerve deficit. He exhibits normal muscle tone. Coordination normal. GCS eye subscore is 4. GCS verbal subscore is 5. GCS motor subscore is 6.  Generalized tingling all over.  Skin: Skin is warm and dry.  Psychiatric: He has a normal mood and affect.  Nursing note and vitals reviewed.    ED Treatments / Results  Labs (all labs ordered are listed, but only abnormal results are displayed) Labs Reviewed  CBC WITH DIFFERENTIAL/PLATELET - Abnormal; Notable for the following:       Result Value   RBC 3.89 (*)    Hemoglobin 11.0 (*)    HCT 33.3 (*)    RDW 15.6 (*)    All other components within normal limits  COMPREHENSIVE METABOLIC PANEL - Abnormal; Notable for the following:    Sodium 131 (*)    Potassium >7.5 (*)    Chloride 95 (*)    Glucose, Bld 107 (*)    BUN 56 (*)    Creatinine, Ser 14.16 (*)    Calcium 8.3 (*)    ALT 11 (*)    Alkaline Phosphatase 172 (*)    Total Bilirubin 1.4 (*)    GFR calc non Af Amer 3 (*)    GFR calc Af Amer 4 (*)    All other components within normal limits  BASIC METABOLIC PANEL - Abnormal; Notable for the following:    Potassium 5.4 (*)    Chloride 97 (*)    BUN 59 (*)    Creatinine, Ser 14.48 (*)    Calcium 8.5 (*)    GFR calc non Af Amer 3 (*)    GFR calc Af Amer 4 (*)    All other components within normal  limits  RENAL FUNCTION PANEL - Abnormal; Notable for the following:    Sodium 133 (*)    Potassium 5.4 (*)    Chloride 95 (*)    Glucose, Bld 165 (*)    BUN 56 (*)    Creatinine, Ser 14.75 (*)    Calcium 8.3 (*)    Albumin 3.1 (*)    GFR calc non Af Amer 3 (*)    GFR calc Af Amer 4 (*)    All other components within normal limits  BASIC METABOLIC PANEL - Abnormal; Notable for the following:    Chloride 94 (*)    Glucose, Bld 113 (*)    BUN 29 (*)    Creatinine, Ser 9.72 (*)    Calcium 8.6 (*)    GFR calc non Af Amer 5 (*)    GFR calc Af Amer 6 (*)    All other components within normal limits  CBG MONITORING, ED - Abnormal; Notable for the following:    Glucose-Capillary 114 (*)    All other components within normal limits  MAGNESIUM  PROTIME-INR  I-STAT TROPOININ, ED  I-STAT CG4 LACTIC ACID, ED  I-STAT CHEM 8, ED  I-STAT CG4 LACTIC ACID, ED    EKG  EKG Interpretation  Date/Time:  Wednesday July 10 2016 15:30:36 EST Ventricular Rate:  44 PR Interval:    QRS Duration: 111 QT Interval:  491 QTC Calculation: 420 R Axis:   -48 Text Interpretation:  Sinus bradycardia Left anterior   fascicular block Probable left ventricular hypertrophy Anterior Q waves, possibly due to LVH Abnormal T, consider ischemia, lateral leads Peaked T waves No STEMI Confirmed by  MD,  ([id number]) on 07/10/2016 3:41:01 PM       Radiology Dg Chest Portable 1 View  Result Date: 07/10/2016 CLINICAL DATA:  Right side chest pain EXAM: PORTABLE CHEST 1 VIEW COMPARISON:  06/09/2016 FINDINGS: Cardiomegaly is noted. No infiltrate or pleural effusion. No pulmonary edema. IMPRESSION: Cardiomegaly.  No active disease. Electronically Signed   By: Liviu  Pop M.D.   On: 07/10/2016 16:00    Procedures Procedures (including critical care time)  CRITICAL CARE Performed by:  J  Total critical care time: 45 minutes Critical care time was exclusive of separately billable  procedures and treating other patients. Critical care was necessary to treat or prevent imminent or life-threatening deterioration. Critical care was time spent personally by me on the following activities: development of treatment plan with patient and/or surrogate as well as nursing, discussions with consultants, evaluation of patient's response to treatment, examination of patient, obtaining history from patient or surrogate, ordering and performing treatments and interventions, ordering and review of laboratory studies, ordering and review of radiographic studies, pulse oximetry and re-evaluation of patient's condition.   Medications Ordered in ED Medications  dextrose 10 % infusion (not administered)  oxyCODONE-acetaminophen (PERCOCET/ROXICET) 5-325 MG per tablet 1 tablet (1 tablet Oral Given 07/11/16 1005)  gabapentin (NEURONTIN) capsule 100 mg (100 mg Oral Not Given 07/11/16 0210)  famotidine (PEPCID) tablet 20 mg (20 mg Oral Not Given 07/11/16 0931)  cloNIDine (CATAPRES - Dosed in mg/24 hr) patch 0.3 mg (not administered)  doxazosin (CARDURA) tablet 2 mg (not administered)  allopurinol (ZYLOPRIM) tablet 100 mg (not administered)  hydrALAZINE (APRESOLINE) tablet 100 mg (100 mg Oral Not Given 07/11/16 0931)  isosorbide mononitrate (IMDUR) 24 hr tablet 60 mg (not administered)  predniSONE (DELTASONE) tablet 10 mg (10 mg Oral Given 07/11/16 0850)  amLODipine (NORVASC) tablet 10 mg (10 mg Oral Not Given 07/11/16 0931)  sevelamer carbonate (RENVELA) tablet 1,600 mg (1,600 mg Oral Given 07/11/16 0850)  atorvastatin (LIPITOR) tablet 40 mg (not administered)  cinacalcet (SENSIPAR) tablet 30 mg (not administered)  pantoprazole (PROTONIX) EC tablet 40 mg (not administered)  heparin injection 5,000 Units (5,000 Units Subcutaneous Not Given 07/11/16 0633)  sodium chloride flush (NS) 0.9 % injection 3 mL (3 mLs Intravenous Not Given 07/11/16 0932)  ondansetron (ZOFRAN) tablet 4 mg ( Oral See  Alternative 07/10/16 1739)    Or  ondansetron (ZOFRAN) injection 4 mg (4 mg Intravenous Given 07/10/16 1739)  ketorolac (TORADOL) 15 MG/ML injection 15 mg (not administered)  0.9 %  sodium chloride infusion (not administered)  0.9 %  sodium chloride infusion (not administered)  heparin injection 1,000 Units (not administered)  alteplase (CATHFLO ACTIVASE) injection 2 mg (not administered)  lidocaine (PF) (XYLOCAINE) 1 % injection 5 mL (not administered)  heparin injection 3,500 Units (not administered)  lidocaine-prilocaine (EMLA) cream 1 application (not administered)  pentafluoroprop-tetrafluoroeth (GEBAUERS) aerosol 1 application (not administered)  pentafluoroprop-tetrafluoroeth (GEBAUERS) aerosol (  Not Given 07/11/16 1006)  albuterol (PROVENTIL) (2.5 MG/3ML) 0.083% nebulizer solution 10 mg (10 mg Nebulization Given by Other 07/10/16 1629)  insulin aspart (novoLOG) injection 10 Units (10 Units Intravenous Given 07/10/16 1627)  dextrose 50 % solution 50 mL (50 mLs Intravenous Given 07/10/16 1624)  calcium gluconate 1 g in sodium chloride 0.9 % 100 mL IVPB (0 g Intravenous Stopped 07/10/16 1645)  sodium bicarbonate 100 mEq in dextrose   5 % 1,000 mL infusion ( Intravenous New Bag/Given 07/10/16 1653)  sodium bicarbonate injection 50 mEq (50 mEq Intravenous Given 07/10/16 1629)  oxyCODONE (Oxy IR/ROXICODONE) immediate release tablet 10 mg (10 mg Oral Given 07/10/16 2233)  oxyCODONE (Oxy IR/ROXICODONE) 5 MG immediate release tablet (  Duplicate 07/10/16 2338)     Initial Impression / Assessment and Plan / ED Course  I have reviewed the triage vital signs and the nursing notes.  Pertinent labs & imaging results that were available during my care of the patient were reviewed by me and considered in my medical decision making (see chart for details).  Clinical Course    Frank [name] is a 50 y.o. male With a past medical history significant for ESR D on dialysis, hypertension, prior DVT,  and diabetes who presents with profound fatigue, generalized weakness, generalized tingling, right-sided neck and upper chest pain, and right-sided headache.   On arrival, patient had to be carried into the emergency department due to weakness and fatigue. Patient quickly had EKG showing bradycardia and sharp T waves. This is all concerning for hyperkalemia.  History and exam are seen above. On exam, patient had tenderness in his right neck and right upper chest where the catheter was removed. No evidence of acute infection. No fluctuance or crepitance. Patient able to move all remedies with generalized weakness. Patient had reported tingling all over. No focal neurologic deficits. Patient's lungs were clear, doubt pneumothorax from procedure. Abdomen was nontender.  Given severe concern for elevated potassium, I stat can eight was ordered. They called to report a potassium greater than 8.5. Patient was immediately started on all hyperkalemia treatments including albuterol, insulin with dextrose, calcium gluconate, and sodium bicarb. Nephrology was quickly called and they recommended all of the above. They will have the patient be admitted and sent directly to dialysis unit for emergent dialysis.  After treatments, patient had improvement in his heart rate into the 80s. Patient was no longer bradycardic.  X-ray was obtained which showed no evidence of pneumothorax. Doubt vascular injury from catheter removal however, given neck pain, this was also considered. It was felt that patient required emergent dialysis more at this moment than further imaging of his neck. This may be considered after admission and dialysis.  Patient will be admitted by hospitalist team and sent directly to dialysis unit over at [facility]. Patient understood plan of transfer and admission.   Final Clinical Impressions(s) / ED Diagnoses   Final diagnoses:  Hyperkalemia  Generalized weakness    New Prescriptions Current  Discharge Medication List      Clinical Impression: 1. Hyperkalemia   2. Generalized weakness     Disposition: Admit to Hospitalist service directly to dialysis unit      J , MD 07/11/16 1032  

## 2016-07-10 NOTE — ED Notes (Signed)
Primary RN made aware of patient's critical potassium

## 2016-07-11 DIAGNOSIS — Z992 Dependence on renal dialysis: Secondary | ICD-10-CM | POA: Diagnosis not present

## 2016-07-11 DIAGNOSIS — T8610 Unspecified complication of kidney transplant: Secondary | ICD-10-CM | POA: Diagnosis not present

## 2016-07-11 DIAGNOSIS — N186 End stage renal disease: Secondary | ICD-10-CM

## 2016-07-11 LAB — BASIC METABOLIC PANEL
ANION GAP: 15 (ref 5–15)
BUN: 29 mg/dL — AB (ref 6–20)
CO2: 26 mmol/L (ref 22–32)
Calcium: 8.6 mg/dL — ABNORMAL LOW (ref 8.9–10.3)
Chloride: 94 mmol/L — ABNORMAL LOW (ref 101–111)
Creatinine, Ser: 9.72 mg/dL — ABNORMAL HIGH (ref 0.61–1.24)
GFR, EST AFRICAN AMERICAN: 6 mL/min — AB (ref >60)
GFR, EST NON AFRICAN AMERICAN: 5 mL/min — AB (ref >60)
Glucose, Bld: 113 mg/dL — ABNORMAL HIGH (ref 65–99)
POTASSIUM: 4.6 mmol/L (ref 3.5–5.1)
SODIUM: 135 mmol/L (ref 135–145)

## 2016-07-11 MED ORDER — HEPARIN SODIUM (PORCINE) 1000 UNIT/ML DIALYSIS: 20.0000 [IU]/kg | INTRAMUSCULAR | Status: DC | PRN

## 2016-07-11 MED ORDER — ALTEPLASE 2 MG IJ SOLR: 2.0000 mg | Freq: Once | INTRAMUSCULAR | Status: DC | PRN

## 2016-07-11 MED ORDER — FAMOTIDINE 20 MG PO TABS: 20.0000 mg | ORAL_TABLET | Freq: Two times a day (BID) | ORAL | Status: DC

## 2016-07-11 MED ORDER — METHOCARBAMOL 500 MG PO TABS: 500.0000 mg | ORAL_TABLET | Freq: Two times a day (BID) | ORAL | 0 refills | Status: DC | PRN

## 2016-07-11 MED ORDER — SODIUM CHLORIDE 0.9 % IV SOLN: 100.0000 mL | INTRAVENOUS | Status: DC | PRN

## 2016-07-11 MED ORDER — HEPARIN SODIUM (PORCINE) 1000 UNIT/ML DIALYSIS: 1000.0000 [IU] | INTRAMUSCULAR | Status: DC | PRN

## 2016-07-11 MED ORDER — LIDOCAINE-PRILOCAINE 2.5-2.5 % EX CREA: 1.0000 "application " | TOPICAL_CREAM | CUTANEOUS | Status: DC | PRN

## 2016-07-11 MED ORDER — PREDNISONE 5 MG PO TABS: 5.0000 mg | ORAL_TABLET | Freq: Every day | ORAL | Status: DC

## 2016-07-11 MED ORDER — GABAPENTIN 100 MG PO CAPS: 100.0000 mg | ORAL_CAPSULE | Freq: Every day | ORAL | Status: DC

## 2016-07-11 MED ORDER — SEVELAMER CARBONATE 800 MG PO TABS
1600.0000 mg | ORAL_TABLET | Freq: Three times a day (TID) | ORAL | Status: DC
Start: 2016-07-11 — End: 2016-07-11
  Administered 2016-07-11: 1600 mg via ORAL
  Filled 2016-07-11: qty 2

## 2016-07-11 MED ORDER — HEPARIN SODIUM (PORCINE) 1000 UNIT/ML DIALYSIS: 3000.0000 [IU] | INTRAMUSCULAR | Status: DC | PRN

## 2016-07-11 MED ORDER — PENTAFLUOROPROP-TETRAFLUOROETH EX AERO: 1.0000 "application " | INHALATION_SPRAY | CUTANEOUS | Status: DC | PRN

## 2016-07-11 MED ORDER — OXYCODONE-ACETAMINOPHEN 5-325 MG PO TABS
1.0000 | ORAL_TABLET | Freq: Four times a day (QID) | ORAL | Status: DC | PRN
  Administered 2016-07-11: 1 via ORAL

## 2016-07-11 MED ORDER — CLONIDINE HCL 0.3 MG/24HR TD PTWK
0.3000 mg | MEDICATED_PATCH | TRANSDERMAL | Status: DC
  Filled 2016-07-11: qty 1

## 2016-07-11 MED ORDER — METHOCARBAMOL 500 MG PO TABS: 500.0000 mg | ORAL_TABLET | Freq: Two times a day (BID) | ORAL | Status: DC | PRN

## 2016-07-11 MED ORDER — AMLODIPINE BESYLATE 10 MG PO TABS: 10.0000 mg | ORAL_TABLET | Freq: Two times a day (BID) | ORAL | Status: DC

## 2016-07-11 MED ORDER — HEPARIN SODIUM (PORCINE) 5000 UNIT/ML IJ SOLN
5000.0000 [IU] | Freq: Three times a day (TID) | INTRAMUSCULAR | Status: DC
  Filled 2016-07-11: qty 1

## 2016-07-11 MED ORDER — HEPARIN SODIUM (PORCINE) 1000 UNIT/ML DIALYSIS: 3500.0000 [IU] | INTRAMUSCULAR | Status: DC | PRN

## 2016-07-11 MED ORDER — LIDOCAINE HCL (PF) 1 % IJ SOLN: 5.0000 mL | INTRAMUSCULAR | Status: DC | PRN

## 2016-07-11 MED ORDER — PENTAFLUOROPROP-TETRAFLUOROETH EX AERO
INHALATION_SPRAY | CUTANEOUS | Status: AC
  Filled 2016-07-11: qty 103.5

## 2016-07-11 MED ORDER — HYDRALAZINE HCL 50 MG PO TABS: 100.0000 mg | ORAL_TABLET | Freq: Two times a day (BID) | ORAL | Status: DC

## 2016-07-11 MED ORDER — OXYCODONE-ACETAMINOPHEN 5-325 MG PO TABS
ORAL_TABLET | ORAL | Status: AC
  Administered 2016-07-11: 1 via ORAL
  Filled 2016-07-11: qty 1

## 2016-07-11 MED ORDER — ALLOPURINOL 100 MG PO TABS: 100.0000 mg | ORAL_TABLET | Freq: Every day | ORAL | Status: DC

## 2016-07-11 MED ORDER — KETOROLAC TROMETHAMINE 15 MG/ML IJ SOLN
15.0000 mg | Freq: Four times a day (QID) | INTRAMUSCULAR | Status: DC | PRN
Start: 2016-07-11 — End: 2016-07-11

## 2016-07-11 MED ORDER — PREDNISONE 10 MG PO TABS
10.0000 mg | ORAL_TABLET | Freq: Every day | ORAL | Status: DC
  Administered 2016-07-11: 10 mg via ORAL
  Filled 2016-07-11: qty 1

## 2016-07-11 MED ORDER — ATORVASTATIN CALCIUM 40 MG PO TABS: 40.0000 mg | ORAL_TABLET | Freq: Every day | ORAL | Status: DC

## 2016-07-11 MED ORDER — DOXAZOSIN MESYLATE 2 MG PO TABS: 2.0000 mg | ORAL_TABLET | Freq: Every day | ORAL | Status: DC

## 2016-07-11 MED ORDER — PANTOPRAZOLE SODIUM 40 MG PO TBEC
40.0000 mg | DELAYED_RELEASE_TABLET | Freq: Every day | ORAL | Status: DC
Start: 2016-07-11 — End: 2016-07-11

## 2016-07-11 MED ORDER — ISOSORBIDE MONONITRATE ER 60 MG PO TB24: 60.0000 mg | ORAL_TABLET | Freq: Every day | ORAL | Status: DC

## 2016-07-11 MED ORDER — SODIUM CHLORIDE 0.9% FLUSH: 3.0000 mL | Freq: Two times a day (BID) | INTRAVENOUS | Status: DC

## 2016-07-11 MED ORDER — CINACALCET HCL 30 MG PO TABS: 30.0000 mg | ORAL_TABLET | Freq: Every day | ORAL | Status: DC

## 2016-07-11 NOTE — Progress Notes (Signed)
[facility] Progress Note   Subjective:  Seen in room. No CP/dyspnea. Weakness and bradycardia have resolved s/p emergent HD last night. K 5.4 today. He was scheduled for outpatient HD at his new [place] center today, but logistically he can not get there in time. Therefore, will need to dialyze here today and then can be discharged.   Objective Vitals:   07/10/16 2330 07/11/16 0000 07/11/16 0026 07/11/16 0046  BP: 117/84 114/85 130/80 (!) 146/91  Pulse: (!) 102 100 99 (!) 112  Resp: _0 Temp:   98.4 F (36.9 C) 98.1 F (36.7 C)  TempSrc:   Oral Oral  SpO2:   95% 96%  Weight:   75.7 kg (166 lb 14.2 oz) 75.7 kg (166 lb 14.2 oz)   Physical Exam General: Well appearing male, NAD Heart: RRR; 2/6 murmur. Lungs: CTAB Abdomen: soft, non-tender Extremities: No LE edema Dialysis Access: L forearm AVF + thrill  Additional Objective Labs: Basic Metabolic Panel:  Recent Labs Lab 07/06/16 0829  07/10/16 1520 07/10/16 1801 07/10/16 2038  NA 138  < > 131* 135 133*  K 6.6*  < > >7.5* 5.4* 5.4*  CL 96*  < > 95* 97* 95*  CO2 26  --  _1 GLUCOSE 81  < > 107* 86 165*  BUN 74*  < > 56* 59* 56*  CREATININE 17.28*  < > 14.16* 14.48* 14.75*  CALCIUM 8.1*  --  8.3* 8.5* 8.3*  PHOS 7.8*  --   --   --  4.6  < > = values in this interval not displayed. Liver Function Tests:  Recent Labs Lab 07/04/16 2318 07/06/16 0829 07/10/16 1520 07/10/16 2038  AST 24  --  28  --   ALT 11*  --  11*  --   ALKPHOS 184*  --  172*  --   BILITOT 0.5  --  1.4*  --   PROT 7.8  --  8.0  --   ALBUMIN 3.4* 3.1* 3.6 3.1*   CBC:  Recent Labs Lab 07/04/16 2318  07/06/16 0829 07/08/16 0426 07/10/16 1520  WBC 5.5  --  5.8  --  8.2  NEUTROABS 3.2  --   --   --  7.2  HGB 11.0*  < > 9.7* 12.6* 11.0*  HCT 34.4*  < > 30.7* 37.0* 33.3*  MCV 87.5  --  86.0  --  85.6  PLT 206  --  205  --  267  < > = values in this interval not displayed.  CBG:  Recent Labs Lab  07/10/16 1719  GLUCAP 114*   Studies/Results: Dg Chest Portable 1 View  Result Date: 07/10/2016 CLINICAL DATA:  Right side chest pain EXAM: PORTABLE CHEST 1 VIEW COMPARISON:  06/09/2016 FINDINGS: Cardiomegaly is noted. No infiltrate or pleural effusion. No pulmonary edema. IMPRESSION: Cardiomegaly.  No active disease. Electronically Signed   By: Lahoma Crocker M.D.   On: 07/10/2016 16:00   Medications:  . allopurinol  100 mg Oral Daily  . amLODipine  10 mg Oral BID  . atorvastatin  40 mg Oral q1800  . cinacalcet  30 mg Oral Q supper  . cloNIDine  0.3 mg Transdermal Weekly  . dextrose   Intravenous Once  . doxazosin  2 mg Oral Daily  . famotidine  20 mg Oral BID  . gabapentin  100 mg Oral QHS  . heparin  5,000 Units Subcutaneous Q8H  . hydrALAZINE  100 mg  Oral BID  . isosorbide mononitrate  60 mg Oral Daily  . pantoprazole  40 mg Oral Daily  . predniSONE  10 mg Oral Q breakfast  . sevelamer carbonate  1,600 mg Oral TID WC  . sodium chloride flush  3 mL Intravenous Q12H    Dialysis Orders:  Assessment/Plan: 1. Hyperkalemia (w/ symptomatic bradycardia): S/p emergent HD 12/20. Improved today, K 5.4. Needs another HD today. We had a long discussion about, unclear why his K got so high. Will need to continue to counsel and see his med bottles at OP dialysis. 2. ESRD: Starting TTS schedule. HD today, then can be discharged with next HD at his [place] center on 12/23. 3. HTN/volume: BP reasonable, 1L UF today. 4. Anemia: Hgb 11. No ESA needed. 5. Secondary hyperparathyroidism: Labs ok. Continue sensipar/binders. 6. Hx kidney transplant: Still on prednisone. 7. NICM: Last EF 35-40%. 8 Renal mass on left kidney by CT 3.7 CM.  Will repeat CT before discharge and he will need to see Urolofgy  Veneta Penton, PA-C 07/11/2016, 8:57 AM  [facility] Pager: [phone]  Renal Attending: As above. Will reduce prednisone to 57m daily. Will obtain CT scan and as outpt  arrange Urology f/u. [place]  Kaesha Kirsch C

## 2016-07-11 NOTE — Clinical Social Work Note (Signed)
Patient discharged today and requested a cab voucher to get to his care at [facility]. Cab voucher given to Network engineer for patient.  Kellen Hover Givens, MSW, LCSW Licensed Clinical Social Worker [place] [phone]

## 2016-07-11 NOTE — Progress Notes (Signed)
Pt. Left unit without receiving AVS. Will continue to look for patient.

## 2016-07-11 NOTE — Progress Notes (Signed)
[name] to be D/C'd Home per MD order.  Discussed prescriptions and follow up appointments with the patient. Prescriptions given to patient, medication list explained in detail. Pt verbalized understanding.  Allergies as of 07/11/2016      Reactions   Butalbital-apap-caffeine Shortness Of Breath, Swelling, Other (See Comments)   Swelling in throat   Ferrlecit [na Ferric Gluc Cplx In Sucrose] Shortness Of Breath, Swelling, Other (See Comments)   Swelling in throat   Minoxidil Shortness Of Breath   Tylenol [acetaminophen] Anaphylaxis, Swelling   Darvocet [propoxyphene N-acetaminophen] Hives      Medication List    TAKE these medications   allopurinol 100 MG tablet Commonly known as:  ZYLOPRIM Take 100 mg by mouth daily.   amLODipine 10 MG tablet Commonly known as:  NORVASC Take 10 mg by mouth 2 (two) times daily.   atorvastatin 40 MG tablet Commonly known as:  LIPITOR Take 1 tablet (40 mg total) by mouth daily at 6 PM.   carvedilol 25 MG tablet Commonly known as:  COREG Take 25 mg by mouth 2 (two) times daily with a meal.   cinacalcet 30 MG tablet Commonly known as:  SENSIPAR Take 30 mg by mouth every evening.   cloNIDine 0.3 mg/24hr patch Commonly known as:  CATAPRES - Dosed in mg/24 hr Place 0.3 mg onto the skin once a week.   doxazosin 2 MG tablet Commonly known as:  CARDURA Take 2 mg by mouth daily.   famotidine 20 MG tablet Commonly known as:  PEPCID Take 1 tablet (20 mg total) by mouth 2 (two) times daily.   gabapentin 100 MG capsule Commonly known as:  NEURONTIN Take 1 capsule (100 mg total) by mouth at bedtime. What changed:  when to take this   hydrALAZINE 100 MG tablet Commonly known as:  APRESOLINE Take 100 mg by mouth 2 (two) times daily.   isosorbide mononitrate 60 MG 24 hr tablet Commonly known as:  IMDUR Take 1 tablet (60 mg total) by mouth daily.   methocarbamol 500 MG tablet Commonly known as:  ROBAXIN Take 1 tablet (500 mg total) by  mouth 2 (two) times daily as needed for muscle spasms.   omeprazole 20 MG capsule Commonly known as:  PRILOSEC Take 20 mg by mouth daily.   ondansetron 4 MG disintegrating tablet Commonly known as:  ZOFRAN ODT Take 1 tablet (4 mg total) by mouth every 8 (eight) hours as needed for nausea or vomiting.   oxycodone 5 MG capsule Commonly known as:  OXY-IR Take 1 capsule (5 mg total) by mouth every 4 (four) hours as needed for pain.   oxyCODONE-acetaminophen 5-325 MG tablet Commonly known as:  PERCOCET/ROXICET Take 1 tablet by mouth every 6 (six) hours as needed for severe pain.   predniSONE 10 MG tablet Commonly known as:  DELTASONE Take 10 mg by mouth daily with breakfast.   sevelamer carbonate 800 MG tablet Commonly known as:  RENVELA Take 1,600 mg by mouth 3 (three) times daily with meals.   warfarin 2 MG tablet Commonly known as:  COUMADIN Take 2 mg by mouth daily. Take with 58m tablet to equal 733m  warfarin 5 MG tablet Commonly known as:  COUMADIN Take 5 mg by mouth See admin instructions. Take 1 tablet (5 mg) by mouth with a 2 mg tablet for a 7 mg dose       Vitals:   07/11/16 1200 07/11/16 1230  BP: 134/90 130/77  Pulse: 92 86  Resp:    Temp:      Skin clean, dry and intact without evidence of skin break down, no evidence of skin tears noted. IV catheter discontinued intact. Site without signs and symptoms of complications. Dressing and pressure applied. Pt denies pain at this time. No complaints noted.  An After Visit Summary was printed and given to the patient. Patient escorted via ambulation, and D/C home via private auto.  Dixie Dials RN, BSN

## 2016-07-11 NOTE — Progress Notes (Signed)
Patient refused lab draw this am. Mingo Amber, RN

## 2016-07-14 ENCOUNTER — Observation Stay (HOSPITAL_COMMUNITY): Payer: Medicare Other

## 2016-07-14 ENCOUNTER — Inpatient Hospital Stay (HOSPITAL_COMMUNITY)
Admission: EM | Admit: 2016-07-14 | Discharge: 2016-07-16 | DRG: 252 | Disposition: A | Discharge status: Home / Self Care | Payer: Medicare Other | Attending: Internal Medicine | Admitting: Internal Medicine

## 2016-07-14 ENCOUNTER — Encounter (HOSPITAL_COMMUNITY): Payer: Self-pay | Admitting: Emergency Medicine

## 2016-07-14 ENCOUNTER — Emergency Department (HOSPITAL_COMMUNITY)
Admission: EM | Admit: 2016-07-14 | Discharge: 2016-07-14 | Disposition: A | Discharge status: Home / Self Care | Payer: Medicare Other | Source: Home / Self Care

## 2016-07-14 DIAGNOSIS — I5042 Chronic combined systolic (congestive) and diastolic (congestive) heart failure: Secondary | ICD-10-CM | POA: Diagnosis present

## 2016-07-14 DIAGNOSIS — Z87891 Personal history of nicotine dependence: Secondary | ICD-10-CM | POA: Insufficient documentation

## 2016-07-14 DIAGNOSIS — N189 Chronic kidney disease, unspecified: Secondary | ICD-10-CM

## 2016-07-14 DIAGNOSIS — I160 Hypertensive urgency: Secondary | ICD-10-CM

## 2016-07-14 DIAGNOSIS — I132 Hypertensive heart and chronic kidney disease with heart failure and with stage 5 chronic kidney disease, or end stage renal disease: Secondary | ICD-10-CM | POA: Diagnosis not present

## 2016-07-14 DIAGNOSIS — Z452 Encounter for adjustment and management of vascular access device: Secondary | ICD-10-CM | POA: Insufficient documentation

## 2016-07-14 DIAGNOSIS — Z940 Kidney transplant status: Secondary | ICD-10-CM | POA: Diagnosis not present

## 2016-07-14 DIAGNOSIS — T82868A Thrombosis of vascular prosthetic devices, implants and grafts, initial encounter: Secondary | ICD-10-CM | POA: Diagnosis not present

## 2016-07-14 DIAGNOSIS — Z86718 Personal history of other venous thrombosis and embolism: Secondary | ICD-10-CM

## 2016-07-14 DIAGNOSIS — N185 Chronic kidney disease, stage 5: Secondary | ICD-10-CM | POA: Diagnosis not present

## 2016-07-14 DIAGNOSIS — Z9115 Patient's noncompliance with renal dialysis: Secondary | ICD-10-CM

## 2016-07-14 DIAGNOSIS — T82898D Other specified complication of vascular prosthetic devices, implants and grafts, subsequent encounter: Secondary | ICD-10-CM

## 2016-07-14 DIAGNOSIS — E875 Hyperkalemia: Secondary | ICD-10-CM | POA: Diagnosis not present

## 2016-07-14 DIAGNOSIS — Z5321 Procedure and treatment not carried out due to patient leaving prior to being seen by health care provider: Secondary | ICD-10-CM

## 2016-07-14 DIAGNOSIS — I428 Other cardiomyopathies: Secondary | ICD-10-CM | POA: Diagnosis not present

## 2016-07-14 DIAGNOSIS — I10 Essential (primary) hypertension: Secondary | ICD-10-CM | POA: Diagnosis not present

## 2016-07-14 DIAGNOSIS — R079 Chest pain, unspecified: Secondary | ICD-10-CM | POA: Diagnosis not present

## 2016-07-14 DIAGNOSIS — D631 Anemia in chronic kidney disease: Secondary | ICD-10-CM | POA: Diagnosis present

## 2016-07-14 DIAGNOSIS — T8249XA Other complication of vascular dialysis catheter, initial encounter: Secondary | ICD-10-CM

## 2016-07-14 DIAGNOSIS — E119 Type 2 diabetes mellitus without complications: Secondary | ICD-10-CM

## 2016-07-14 DIAGNOSIS — Z79899 Other long term (current) drug therapy: Secondary | ICD-10-CM

## 2016-07-14 DIAGNOSIS — Z9119 Patient's noncompliance with other medical treatment and regimen: Secondary | ICD-10-CM

## 2016-07-14 DIAGNOSIS — N186 End stage renal disease: Secondary | ICD-10-CM

## 2016-07-14 DIAGNOSIS — F419 Anxiety disorder, unspecified: Secondary | ICD-10-CM | POA: Diagnosis present

## 2016-07-14 DIAGNOSIS — Z7901 Long term (current) use of anticoagulants: Secondary | ICD-10-CM

## 2016-07-14 DIAGNOSIS — Z86711 Personal history of pulmonary embolism: Secondary | ICD-10-CM

## 2016-07-14 DIAGNOSIS — F329 Major depressive disorder, single episode, unspecified: Secondary | ICD-10-CM | POA: Diagnosis present

## 2016-07-14 DIAGNOSIS — Z789 Other specified health status: Secondary | ICD-10-CM

## 2016-07-14 DIAGNOSIS — Z992 Dependence on renal dialysis: Secondary | ICD-10-CM

## 2016-07-14 DIAGNOSIS — K219 Gastro-esophageal reflux disease without esophagitis: Secondary | ICD-10-CM | POA: Diagnosis present

## 2016-07-14 DIAGNOSIS — T82898A Other specified complication of vascular prosthetic devices, implants and grafts, initial encounter: Secondary | ICD-10-CM | POA: Diagnosis not present

## 2016-07-14 DIAGNOSIS — Y832 Surgical operation with anastomosis, bypass or graft as the cause of abnormal reaction of the patient, or of later complication, without mention of misadventure at the time of the procedure: Secondary | ICD-10-CM | POA: Diagnosis present

## 2016-07-14 LAB — CBC
HCT: 28.8 % — ABNORMAL LOW (ref 39.0–52.0)
HEMOGLOBIN: 9.2 g/dL — AB (ref 13.0–17.0)
MCH: 27.5 pg (ref 26.0–34.0)
MCHC: 31.9 g/dL (ref 30.0–36.0)
MCV: 86.2 fL (ref 78.0–100.0)
Platelets: 186 10*3/uL (ref 150–400)
RBC: 3.34 MIL/uL — ABNORMAL LOW (ref 4.22–5.81)
RDW: 15.4 % (ref 11.5–15.5)
WBC: 5.6 10*3/uL (ref 4.0–10.5)

## 2016-07-14 LAB — I-STAT CHEM 8, ED
BUN: 68 mg/dL — AB (ref 6–20)
CALCIUM ION: 0.95 mmol/L — AB (ref 1.15–1.40)
CHLORIDE: 102 mmol/L (ref 101–111)
Creatinine, Ser: 14.7 mg/dL — ABNORMAL HIGH (ref 0.61–1.24)
GLUCOSE: 120 mg/dL — AB (ref 65–99)
HCT: 30 % — ABNORMAL LOW (ref 39.0–52.0)
Hemoglobin: 10.2 g/dL — ABNORMAL LOW (ref 13.0–17.0)
Potassium: 6 mmol/L — ABNORMAL HIGH (ref 3.5–5.1)
SODIUM: 137 mmol/L (ref 135–145)
TCO2: 25 mmol/L (ref 0–100)

## 2016-07-14 LAB — PROTIME-INR
INR: 1.03
Prothrombin Time: 13.5 seconds (ref 11.4–15.2)

## 2016-07-14 MED ORDER — ONDANSETRON HCL 4 MG PO TABS
4.0000 mg | ORAL_TABLET | Freq: Four times a day (QID) | ORAL | Status: DC | PRN
  Administered 2016-07-15 (×2): 4 mg via ORAL
  Filled 2016-07-14 (×2): qty 1

## 2016-07-14 MED ORDER — ONDANSETRON 4 MG PO TBDP
4.0000 mg | ORAL_TABLET | Freq: Once | ORAL | Status: AC
  Administered 2016-07-14: 4 mg via ORAL
  Filled 2016-07-14: qty 1

## 2016-07-14 MED ORDER — METHOCARBAMOL 500 MG PO TABS: 500.0000 mg | ORAL_TABLET | Freq: Two times a day (BID) | ORAL | Status: DC | PRN

## 2016-07-14 MED ORDER — GABAPENTIN 100 MG PO CAPS
100.0000 mg | ORAL_CAPSULE | Freq: Every day | ORAL | Status: DC
  Administered 2016-07-15 (×2): 100 mg via ORAL
  Filled 2016-07-14 (×2): qty 1

## 2016-07-14 MED ORDER — CARVEDILOL 12.5 MG PO TABS
25.0000 mg | ORAL_TABLET | Freq: Two times a day (BID) | ORAL | Status: DC
  Administered 2016-07-15 – 2016-07-16 (×3): 25 mg via ORAL
  Filled 2016-07-14 (×3): qty 2

## 2016-07-14 MED ORDER — OXYCODONE HCL 5 MG PO TABS
5.0000 mg | ORAL_TABLET | Freq: Once | ORAL | Status: AC
  Administered 2016-07-14: 5 mg via ORAL
  Filled 2016-07-14: qty 1

## 2016-07-14 MED ORDER — CINACALCET HCL 30 MG PO TABS
30.0000 mg | ORAL_TABLET | Freq: Every evening | ORAL | Status: DC
  Administered 2016-07-15: 30 mg via ORAL
  Filled 2016-07-14: qty 1

## 2016-07-14 MED ORDER — SEVELAMER CARBONATE 800 MG PO TABS
1600.0000 mg | ORAL_TABLET | Freq: Three times a day (TID) | ORAL | Status: DC
  Administered 2016-07-15 – 2016-07-16 (×2): 1600 mg via ORAL
  Filled 2016-07-14 (×4): qty 2

## 2016-07-14 MED ORDER — CLONIDINE HCL 0.3 MG/24HR TD PTWK
0.3000 mg | MEDICATED_PATCH | TRANSDERMAL | Status: DC
  Filled 2016-07-14: qty 1

## 2016-07-14 MED ORDER — ALLOPURINOL 100 MG PO TABS
100.0000 mg | ORAL_TABLET | Freq: Every day | ORAL | Status: DC
  Administered 2016-07-15 – 2016-07-16 (×2): 100 mg via ORAL
  Filled 2016-07-14 (×2): qty 1

## 2016-07-14 MED ORDER — FAMOTIDINE 20 MG PO TABS
20.0000 mg | ORAL_TABLET | Freq: Two times a day (BID) | ORAL | Status: DC
  Administered 2016-07-15: 20 mg via ORAL
  Filled 2016-07-14: qty 1

## 2016-07-14 MED ORDER — HYDROMORPHONE HCL 2 MG PO TABS
2.0000 mg | ORAL_TABLET | ORAL | Status: DC | PRN
  Administered 2016-07-14 – 2016-07-16 (×4): 2 mg via ORAL
  Filled 2016-07-14 (×5): qty 1

## 2016-07-14 MED ORDER — OXYCODONE-ACETAMINOPHEN 5-325 MG PO TABS
1.0000 | ORAL_TABLET | Freq: Four times a day (QID) | ORAL | Status: DC | PRN
  Administered 2016-07-15 – 2016-07-16 (×4): 1 via ORAL
  Filled 2016-07-14 (×4): qty 1

## 2016-07-14 MED ORDER — SODIUM CHLORIDE 0.9% FLUSH: 3.0000 mL | INTRAVENOUS | Status: DC | PRN

## 2016-07-14 MED ORDER — SODIUM POLYSTYRENE SULFONATE 15 GM/60ML PO SUSP
50.0000 g | Freq: Once | ORAL | Status: AC
  Administered 2016-07-14: 50 g via ORAL

## 2016-07-14 MED ORDER — SODIUM CHLORIDE 0.9 % IV BOLUS (SEPSIS)
250.0000 mL | Freq: Once | INTRAVENOUS | Status: AC
  Administered 2016-07-14: 250 mL via INTRAVENOUS

## 2016-07-14 MED ORDER — ONDANSETRON HCL 4 MG/2ML IJ SOLN: 4.0000 mg | Freq: Four times a day (QID) | INTRAMUSCULAR | Status: DC | PRN

## 2016-07-14 MED ORDER — PANTOPRAZOLE SODIUM 40 MG PO TBEC
40.0000 mg | DELAYED_RELEASE_TABLET | Freq: Every day | ORAL | Status: DC
  Administered 2016-07-15 – 2016-07-16 (×2): 40 mg via ORAL
  Filled 2016-07-14 (×2): qty 1

## 2016-07-14 MED ORDER — ISOSORBIDE MONONITRATE ER 30 MG PO TB24
60.0000 mg | ORAL_TABLET | Freq: Every day | ORAL | Status: DC
  Administered 2016-07-15 – 2016-07-16 (×2): 60 mg via ORAL
  Filled 2016-07-14 (×2): qty 2

## 2016-07-14 MED ORDER — SODIUM CHLORIDE 0.9% FLUSH
3.0000 mL | Freq: Two times a day (BID) | INTRAVENOUS | Status: DC
  Administered 2016-07-15: 3 mL via INTRAVENOUS

## 2016-07-14 MED ORDER — HYDRALAZINE HCL 50 MG PO TABS
100.0000 mg | ORAL_TABLET | Freq: Two times a day (BID) | ORAL | Status: DC
  Administered 2016-07-15 – 2016-07-16 (×4): 100 mg via ORAL
  Filled 2016-07-14 (×4): qty 2

## 2016-07-14 MED ORDER — DOXAZOSIN MESYLATE 2 MG PO TABS
2.0000 mg | ORAL_TABLET | Freq: Every day | ORAL | Status: DC
  Administered 2016-07-15 – 2016-07-16 (×2): 2 mg via ORAL
  Filled 2016-07-14 (×2): qty 1

## 2016-07-14 MED ORDER — AMLODIPINE BESYLATE 5 MG PO TABS
10.0000 mg | ORAL_TABLET | Freq: Two times a day (BID) | ORAL | Status: DC
  Administered 2016-07-15 – 2016-07-16 (×3): 10 mg via ORAL
  Filled 2016-07-14 (×3): qty 2

## 2016-07-14 MED ORDER — SODIUM CHLORIDE 0.9 % IV SOLN: 250.0000 mL | INTRAVENOUS | Status: DC | PRN

## 2016-07-14 MED ORDER — SODIUM POLYSTYRENE SULFONATE 15 GM/60ML PO SUSP
50.0000 g | Freq: Once | ORAL | Status: AC
  Administered 2016-07-15: 50 g via ORAL
  Filled 2016-07-14: qty 240

## 2016-07-14 MED ORDER — SODIUM CHLORIDE 0.9% FLUSH
3.0000 mL | Freq: Two times a day (BID) | INTRAVENOUS | Status: DC
  Administered 2016-07-15 (×2): 3 mL via INTRAVENOUS

## 2016-07-14 MED ORDER — SODIUM POLYSTYRENE SULFONATE 15 GM/60ML PO SUSP
15.0000 g | Freq: Once | ORAL | Status: DC
  Filled 2016-07-14: qty 60

## 2016-07-14 MED ORDER — PREDNISONE 20 MG PO TABS
10.0000 mg | ORAL_TABLET | Freq: Every day | ORAL | Status: DC
  Administered 2016-07-15 – 2016-07-16 (×2): 10 mg via ORAL
  Filled 2016-07-14 (×3): qty 1

## 2016-07-14 MED ORDER — ATORVASTATIN CALCIUM 40 MG PO TABS
40.0000 mg | ORAL_TABLET | Freq: Every day | ORAL | Status: DC
  Administered 2016-07-15: 40 mg via ORAL
  Filled 2016-07-14: qty 1

## 2016-07-14 MED ORDER — ONDANSETRON HCL 4 MG/2ML IJ SOLN
4.0000 mg | Freq: Once | INTRAMUSCULAR | Status: AC
  Administered 2016-07-14: 4 mg via INTRAVENOUS
  Filled 2016-07-14: qty 2

## 2016-07-14 MED ORDER — HYDRALAZINE HCL 20 MG/ML IJ SOLN
10.0000 mg | INTRAMUSCULAR | Status: DC | PRN
  Administered 2016-07-15: 10 mg via INTRAVENOUS
  Filled 2016-07-14: qty 1

## 2016-07-14 NOTE — ED Triage Notes (Signed)
Pt here to have dialysis access assessed; pt sts issue with dialysis center yesterdat

## 2016-07-14 NOTE — ED Triage Notes (Signed)
Pt presents to ED for placement of a temporary dialysis catheter because patient states "my fistula is blown".  Pt was sent here from dialysis.

## 2016-07-14 NOTE — ED Provider Notes (Signed)
[facility] DEPT Provider Note   CSN: [id number] Arrival date & time: 07/14/16  1942     History   Chief Complaint Chief Complaint  Patient presents with  . Vascular Access Problem    HPI Frank [name] is a 50 y.o. male.  Patient is a 49 year old with history of DVT, diabetes, ESRD, CHF who presents from dialysis center for evaluation of clotted off left arm fistula. Patient last had dialysis on Wednesday. Patient has had 2 days of nausea and vomiting as well as fatigue. Patient states typically when he does not get dialysis he has these symptoms. Patient denies any fevers, diarrhea, chest pain, shortness of breath, palpitations.   The history is provided by the patient.    Past Medical History:  Diagnosis Date  . Anemia   . Anxiety   . Chronic combined systolic and diastolic CHF (congestive heart failure) (HCC)    a. EF 20-25% by echo in 08/2015 b. echo 10/2015: EF 35-40%, diffuse HK, severe LAE, moderate RAE, small pericardial effusion  . Complication of anesthesia    itching, sore throat  . Depression   . Dialysis patient ([place])   . ESRD (end stage renal disease) ([place])    due to HTN per patient, followed at [facility], s/p failed kidney transplant - dialysis Tue, Th, Sat  . Hyperkalemia 12/2015  . Hypertension   . Junctional rhythm    a. noted in 08/2015: hyperkalemic at that time  b. 12/2015: presented in junctional rhythm w/ K+ of 6.6. Resolved with improvement of K+ levels.  . Nonischemic cardiomyopathy ([place])    a. 08/2014: cath showing minimal CAD, but tortuous arteries noted.   . Personal history of DVT (deep vein thrombosis)/ PE 05/26/2016   In Oct 2015 had small subsemental LUL PE w/o DVT (LE dopplers neg) and was felt to be HD cath related, treated w coumadin.  IN May 2016 had small vein DVT (acute/subacute) in the R basilic/ brachial veins of the RUE, resumed on coumadin.  Had R sided HD cath at that time.    . Renal insufficiency   . Shortness of breath   .  Type II diabetes mellitus (HCC)    No history per patient, but remains under history as A1c would not be accurate given on dialysis    Patient Active Problem List   Diagnosis Date Noted  . Anemia associated with chronic renal failure   . End-stage renal disease on hemodialysis ([place])   . Subtherapeutic international normalized ratio (INR)   . Hypertension   . Dialysis patient, noncompliant ([place]) 06/22/2016  . ESRD (end stage renal disease) on dialysis ([place])   . ESRD (end stage renal disease) ([place]) 06/20/2016  . ESRD needing dialysis ([place]) 06/14/2016  . Uremia 06/07/2016  . Hyperkalemia 05/29/2016  . Chronic back pain 05/29/2016  . GERD (gastroesophageal reflux disease) 05/29/2016  . Personal history of DVT (deep vein thrombosis)/ PE 05/26/2016  . Bradycardia 03/31/2016  . Nonischemic cardiomyopathy ([place]) 01/09/2016  . Bilateral low back pain without sciatica   . Left renal mass 10/30/2015  . Constipation 10/30/2015  . Adjustment disorder with mixed anxiety and depressed mood 08/20/2015  . Chronic epididymitis 06/19/2015  . Groin pain, chronic, right 06/19/2015  . Incarcerated right inguinal hernia 02/16/2015  . Essential hypertension 01/02/2015  . Dyslipidemia   . ESRD on hemodialysis ([place])   . Anemia of chronic kidney failure 06/24/2013    Past Surgical History:  Procedure Laterality Date  . CAPD INSERTION    .  CAPD REMOVAL    . INGUINAL HERNIA REPAIR Right 02/14/2015   Procedure: REPAIR INCARCERATED RIGHT INGUINAL HERNIA;  Surgeon: [name], MD;  Location: [facility];  Service: General;  Laterality: Right;  . INSERTION OF DIALYSIS CATHETER Right 09/23/2015   Procedure: exchange of Right internal Dialysis Catheter.;  Surgeon: [name], MD;  Location: [facility];  Service: Vascular;  Laterality: Right;  . KIDNEY RECEIPIENT  2006   failed and started HD in March 2014  . LEFT HEART CATHETERIZATION WITH CORONARY ANGIOGRAM N/A 09/02/2014   Procedure: LEFT HEART CATHETERIZATION  WITH CORONARY ANGIOGRAM;  Surgeon: [name], MD;  Location[facility] C[facility] LAB;  Service: Cardiovascular;  Laterality: N/A;       Home Medications    Prior to Admission medications   Medication Sig Start Date End Date Taking? Authorizing Provider  allopurinol (ZYLOPRIM) 100 MG tablet Take 100 mg by mouth daily.    Historical Provider, MD  amLODipine (NORVASC) 10 MG tablet Take 10 mg by mouth 2 (two) times daily.     Historical Provider, MD  atorvastatin (LIPITOR) 40 MG tablet Take 1 tablet (40 mg total) by mouth daily at 6 PM. 09/02/14   Barton Dubois, MD  carvedilol (COREG) 25 MG tablet Take 25 mg by mouth 2 (two) times daily with a meal.    Historical Provider, MD  cinacalcet (SENSIPAR) 30 MG tablet Take 30 mg by mouth every evening.     Historical Provider, MD  cloNIDine (CATAPRES - DOSED IN MG/24 HR) 0.3 mg/24hr patch Place 0.3 mg onto the skin once a week.    Historical Provider, MD  doxazosin (CARDURA) 2 MG tablet Take 2 mg by mouth daily.    Historical Provider, MD  famotidine (PEPCID) 20 MG tablet Take 1 tablet (20 mg total) by mouth 2 (two) times daily. 02/13/16   Charlesetta Shanks, MD  gabapentin (NEURONTIN) 100 MG capsule Take 1 capsule (100 mg total) by mouth at bedtime. Patient taking differently: Take 100 mg by mouth 3 (three) times daily.  05/21/16   [name], DO  hydrALAZINE (APRESOLINE) 100 MG tablet Take 100 mg by mouth 2 (two) times daily.     Historical Provider, MD  isosorbide mononitrate (IMDUR) 60 MG 24 hr tablet Take 1 tablet (60 mg total) by mouth daily. 01/09/16   Smiley Houseman, MD  methocarbamol (ROBAXIN) 500 MG tablet Take 1 tablet (500 mg total) by mouth 2 (two) times daily as needed for muscle spasms. 07/11/16   [name], MD  omeprazole (PRILOSEC) 20 MG capsule Take 20 mg by mouth daily. 07/01/13   Historical Provider, MD  ondansetron (ZOFRAN ODT) 4 MG disintegrating tablet Take 1 tablet (4 mg total) by mouth every 8 (eight) hours as needed for nausea  or vomiting. 05/16/16   Sherwood Gambler, MD  oxycodone (OXY-IR) 5 MG capsule Take 1 capsule (5 mg total) by mouth every 4 (four) hours as needed for pain. 05/30/16   [name], MD  oxyCODONE-acetaminophen (PERCOCET/ROXICET) 5-325 MG tablet Take 1 tablet by mouth every 6 (six) hours as needed for severe pain.    Historical Provider, MD  predniSONE (DELTASONE) 10 MG tablet Take 10 mg by mouth daily with breakfast.    Historical Provider, MD  sevelamer carbonate (RENVELA) 800 MG tablet Take 1,600 mg by mouth 3 (three) times daily with meals.     Historical Provider, MD  warfarin (COUMADIN) 2 MG tablet Take 2 mg by mouth daily. Take with 66m  tablet to equal 48m 01/31/16   Historical Provider, MD  warfarin (COUMADIN) 5 MG tablet Take 5 mg by mouth See admin instructions. Take 1 tablet (5 mg) by mouth with a 2 mg tablet for a 7 mg dose 01/31/16   Historical Provider, MD    Family History Family History  Problem Relation Age of Onset  . Hypertension Other     Social History Social History  Substance Use Topics  . Smoking status: Former Smoker    Packs/day: 0.00    Years: 1.00    Types: Cigarettes  . Smokeless tobacco: Never Used     Comment: quit Jan 2014  . Alcohol use No     Allergies   Butalbital-apap-caffeine; Ferrlecit [na ferric gluc cplx in sucrose]; Minoxidil; Tylenol [acetaminophen]; and Darvocet [propoxyphene n-acetaminophen]   Review of Systems Review of Systems  Constitutional: Positive for fatigue. Negative for chills and fever.  Eyes: Negative for pain and visual disturbance.  Respiratory: Negative for cough and shortness of breath.   Cardiovascular: Negative for chest pain, palpitations and leg swelling.  Gastrointestinal: Positive for nausea and vomiting. Negative for abdominal pain and diarrhea.  Musculoskeletal: Negative for back pain.  Skin: Negative for rash.  Neurological: Negative for syncope, light-headedness and headaches.  All other systems reviewed and are  negative.    Physical Exam Updated Vital Signs BP (!) 173/114 (BP Location: Right Arm)   Pulse 76   Temp 98.8 F (37.1 C) (Oral)   Resp 18   Ht _0  (1.88 m)   Wt 74.8 kg   SpO2 98%   BMI 21.18 kg/m   Physical Exam  Constitutional: He is oriented to person, place, and time. He appears well-developed and well-nourished. No distress.  HENT:  Head: Normocephalic and atraumatic.  Mouth/Throat: Oropharynx is clear and moist.  Eyes: Conjunctivae are normal. Pupils are equal, round, and reactive to light.  Neck: Neck supple.  Cardiovascular: Normal rate and regular rhythm.   No murmur heard. Pulmonary/Chest: Effort normal and breath sounds normal. No respiratory distress. He has no wheezes. He has no rales.  Abdominal: Soft. He exhibits no distension. There is no tenderness.  Musculoskeletal: He exhibits no edema.  Neurological: He is alert and oriented to person, place, and time.  Skin: Skin is warm and dry.  Psychiatric: He has a normal mood and affect.  Nursing note and vitals reviewed.    ED Treatments / Results  Labs (all labs ordered are listed, but only abnormal results are displayed) Labs Reviewed  CBC - Abnormal; Notable for the following:       Result Value   RBC 3.34 (*)    Hemoglobin 9.2 (*)    HCT 28.8 (*)    All other components within normal limits  I-STAT CHEM 8, ED - Abnormal; Notable for the following:    Potassium 6.0 (*)    BUN 68 (*)    Creatinine, Ser 14.70 (*)    Glucose, Bld 120 (*)    Calcium, Ion 0.95 (*)    Hemoglobin 10.2 (*)    HCT 30.0 (*)    All other components within normal limits    EKG  EKG Interpretation  Date/Time:  Sunday July 14 2016 20:17:09 EST Ventricular Rate:  65 PR Interval:    QRS Duration: 101 QT Interval:  450 QTC Calculation: 468 R Axis:   -38 Text Interpretation:  Sinus rhythm LVH with secondary repolarization abnormality Baseline wander in lead(s) V3 Confirmed by ZAVITZ MD, JOSHUA ([id number]  on 07/14/2016  10:38:32 PM       Radiology No results found.  Procedures Procedures (including critical care time)  Medications Ordered in ED Medications  sodium polystyrene (KAYEXALATE) 15 GM/60ML suspension 50 g (not administered)  ondansetron (ZOFRAN) injection 4 mg (4 mg Intravenous Given 07/14/16 2040)  oxyCODONE (Oxy IR/ROXICODONE) immediate release tablet 5 mg (5 mg Oral Given 07/14/16 2040)  sodium chloride 0.9 % bolus 250 mL (0 mLs Intravenous Stopped 07/14/16 2240)  ondansetron (ZOFRAN-ODT) disintegrating tablet 4 mg (4 mg Oral Given 07/14/16 2244)     Initial Impression / Assessment and Plan / ED Course  I have reviewed the triage vital signs and the nursing notes.  Pertinent labs & imaging results that were available during my care of the patient were reviewed by me and considered in my medical decision making (see chart for details).  Clinical Course    Patient is a 50 year old male with history of ESRD, CHF, DVT, diabetes who presents with dialysis access issue. Over the last 2 days the fistula in his left arm is clotted off. Patient last dialyzed on Wednesday while in the hospital. Patient gets dialysis Tuesday/Thursday/Saturday. Patient try to get dialysis yesterday but could not due to the clotted fistula and one again today but they still cannot do any dialysis.  Patient here does not complain of any chest pain, palpitations, shortness of breath. He has had intermittent nausea vomiting and worsening fatigue which is consistent with how he feels from missing dialysis. On exam patient does not appear fluid overloaded. No crackles on exam and no peripheral edema.  EKG shows normal sinus rhythm with no peak T waves or flattened P waves. QRS narrow. Potassium is 6.0. BUN 68, creatinine 14.7.  Kayexalate was ordered however the patient refused because he says it makes him feel nauseous. I spoke with both vascular surgery who stated nephrology is responsible for arranging  troubleshooting of clotted dialysis fistulas. Dr. Archie Balboa was paged who recommended 50 g of Kayexalate today and then another 37m Kayexalate tomorrow and to follow up with his dialysis center to arrange for troubleshooting of his clotted fistula. He states there is no acute indication for dialysis at this time. I'm concerned about that plan given recently his potassium surged to near 8 and almost coded.   I explained importance of taking the Kayexalate for his hyperkalemia after thorough discussion patient willing to take the Kayexalate. Patient voices that he feels uncomfortable bleeding with the elevated potassium due to prior experience of it going up and causing him to be acutely ill. I told him I would speak with the hospitalist to see if he can be brought in for observation and have his potassium rechecked after the Kayexalate. He voices that he is willing to comply with kayexalate.   Pt will be admitted to hospitalist and will likely need dialysis while admitted.   Pt seen with attending, Dr. ZReather Converse  Final Clinical Impressions(s) / ED Diagnoses   Final diagnoses:  Hyperkalemia  Problem with vascular access    New Prescriptions New Prescriptions   No medications on file     RTobie Poet DO 07/14/16 2308    JElnora Morrison MD 07/15/16 2332

## 2016-07-14 NOTE — H&P (Signed)
History and Physical    ARTHOR GORTER QBH:[id number] DOB: 1965/12/07 DOA: 07/14/2016  PCP: Angelica Chessman, MD   Patient coming from: Home  Chief Complaint: HD access problem, nausea, vomiting, fatigue   HPI: Frank [name] is a 50 y.o. male with medical history significant for end-stage renal disease on hemodialysis, history of DVT and PE, nonischemic cardiomyopathy, hypertension, GERD, and poor adherence with his treatment plan who presents the emergency department at the direction of his outpatient dialysis facility for evaluation of the problem accessing his fistula, complicated by hyperkalemia. Patient reports that he was last dialyzed on 07/10/2016 and reports completing the session without incident and at that time. Yesterday, he reports going to his dialysis center, but the fistula was apparently clotted and cannot be accessed. Some intervention was taken on the fistula there at the HD center yesterday and he was asked to return today, which he did, but unfortunately the fistula was still not working and so he was directed to the ED. Patient reports development of nausea with nonbloody nonbilious vomiting and fatigue, worsening over the past few days. He describes the symptoms as typical for him when he has gone a few days without dialysis. He denies any significant chest pain or dyspnea and denies cough. He denies abdominal pain or diarrhea, but notes nausea with nonbloody vomiting. Patient reports pain at his fistula site in the distal right upper extremity. He denies headache, change in vision or hearing, loss of coordination, or focal numbness or weakness.  ED Course: Upon arrival to the ED, patient is found to be afebrile, saturating adequately on room air, hypertensive to 175/115, and with vitals otherwise stable. EKG features a sinus rhythm with LVH by voltage criteria and secondary repolarization abnormality. Chest x-ray is notable for cardiomegaly with possible pneumothorax versus  artifact, but with the clinical condition not consistent with pneumothorax. Point-of-care chemistries are notable for potassium of 6.0, BUN of 68, and serum creatinine 14.7. CBC features a normocytic anemia with hemoglobin 9.2, only slightly lower than recent priors in the 10 range. Nephrology was consulted by the ED physician and advised treating the patient with Kayexalate. 50 g oral Kayexalate was ordered. Hypertension improved with administration of his evening medicines and he has not been any apparent respiratory distress. He will be observed on the telemetry unit for ongoing evaluation and management of hyperkalemia in the setting of end-stage renal disease with 5 day since last dialysis session, complicated by problems with his fistula.  Review of Systems:  All other systems reviewed and apart from HPI, are negative.  Past Medical History:  Diagnosis Date  . Anemia   . Anxiety   . Chronic combined systolic and diastolic CHF (congestive heart failure) (HCC)    a. EF 20-25% by echo in 08/2015 b. echo 10/2015: EF 35-40%, diffuse HK, severe LAE, moderate RAE, small pericardial effusion  . Complication of anesthesia    itching, sore throat  . Depression   . Dialysis patient ([place])   . ESRD (end stage renal disease) ([name])    due to HTN per patient, followed at [facility], s/p failed kidney transplant - dialysis Tue, Th, Sat  . Hyperkalemia 12/2015  . Hypertension   . Junctional rhythm    a. noted in 08/2015: hyperkalemic at that time  b. 12/2015: presented in junctional rhythm w/ K+ of 6.6. Resolved with improvement of K+ levels.  . Nonischemic cardiomyopathy ([place])    a. 08/2014: cath showing minimal CAD, but tortuous arteries noted.   [name]  Personal history of DVT (deep vein thrombosis)/ PE 05/26/2016   In Oct 2015 had small subsemental LUL PE w/o DVT (LE dopplers neg) and was felt to be HD cath related, treated w coumadin.  IN May 2016 had small vein DVT (acute/subacute) in the R basilic/ brachial  veins of the RUE, resumed on coumadin.  Had R sided HD cath at that time.    . Renal insufficiency   . Shortness of breath   . Type II diabetes mellitus (HCC)    No history per patient, but remains under history as A1c would not be accurate given on dialysis    Past Surgical History:  Procedure Laterality Date  . CAPD INSERTION    . CAPD REMOVAL    . INGUINAL HERNIA REPAIR Right 02/14/2015   Procedure: REPAIR INCARCERATED RIGHT INGUINAL HERNIA;  Surgeon: [name], MD;  Location: [place];  Service: General;  Laterality: Right;  . INSERTION OF DIALYSIS CATHETER Right 09/23/2015   Procedure: exchange of Right internal Dialysis Catheter.;  Surgeon: [name], MD;  Location: [place];  Service: Vascular;  Laterality: Right;  . KIDNEY RECEIPIENT  2006   failed and started HD in March 2014  . LEFT HEART CATHETERIZATION WITH CORONARY ANGIOGRAM N/A 09/02/2014   Procedure: LEFT HEART CATHETERIZATION WITH CORONARY ANGIOGRAM;  Surgeon: [name], MD;  Location: [facility] CATH LAB;  Service: Cardiovascular;  Laterality: N/A;     reports that he has quit smoking. His smoking use included Cigarettes. He smoked 0.00 packs per day for 1.00 year. He has never used smokeless tobacco. He reports that he does not drink alcohol or use drugs.  Allergies  Allergen Reactions  . Butalbital-Apap-Caffeine Shortness Of Breath, Swelling and Other (See Comments)    Swelling in throat  . Ferrlecit [Na Ferric Gluc Cplx In Sucrose] Shortness Of Breath, Swelling and Other (See Comments)    Swelling in throat  . Minoxidil Shortness Of Breath  . Tylenol [Acetaminophen] Anaphylaxis and Swelling  . Darvocet [Propoxyphene N-Acetaminophen] Hives    Family History  Problem Relation Age of Onset  . Hypertension Other      Prior to Admission medications   Medication Sig Start Date End Date Taking? Authorizing Provider  allopurinol (ZYLOPRIM) 100 MG tablet Take 100 mg by mouth daily.    Historical Provider, MD    amLODipine (NORVASC) 10 MG tablet Take 10 mg by mouth 2 (two) times daily.     Historical Provider, MD  atorvastatin (LIPITOR) 40 MG tablet Take 1 tablet (40 mg total) by mouth daily at 6 PM. 09/02/14   Barton Dubois, MD  carvedilol (COREG) 25 MG tablet Take 25 mg by mouth 2 (two) times daily with a meal.    Historical Provider, MD  cinacalcet (SENSIPAR) 30 MG tablet Take 30 mg by mouth every evening.     Historical Provider, MD  cloNIDine (CATAPRES - DOSED IN MG/24 HR) 0.3 mg/24hr patch Place 0.3 mg onto the skin once a week.    Historical Provider, MD  doxazosin (CARDURA) 2 MG tablet Take 2 mg by mouth daily.    Historical Provider, MD  famotidine (PEPCID) 20 MG tablet Take 1 tablet (20 mg total) by mouth 2 (two) times daily. 02/13/16   Charlesetta Shanks, MD  gabapentin (NEURONTIN) 100 MG capsule Take 1 capsule (100 mg total) by mouth at bedtime. Patient taking differently: Take 100 mg by mouth 3 (three) times daily.  05/21/16   [name], DO  hydrALAZINE (APRESOLINE) 100  MG tablet Take 100 mg by mouth 2 (two) times daily.     Historical Provider, MD  isosorbide mononitrate (IMDUR) 60 MG 24 hr tablet Take 1 tablet (60 mg total) by mouth daily. 01/09/16   Smiley Houseman, MD  methocarbamol (ROBAXIN) 500 MG tablet Take 1 tablet (500 mg total) by mouth 2 (two) times daily as needed for muscle spasms. 07/11/16   [name], MD  omeprazole (PRILOSEC) 20 MG capsule Take 20 mg by mouth daily. 07/01/13   Historical Provider, MD  ondansetron (ZOFRAN ODT) 4 MG disintegrating tablet Take 1 tablet (4 mg total) by mouth every 8 (eight) hours as needed for nausea or vomiting. 05/16/16   Sherwood Gambler, MD  oxycodone (OXY-IR) 5 MG capsule Take 1 capsule (5 mg total) by mouth every 4 (four) hours as needed for pain. 05/30/16   [name], MD  oxyCODONE-acetaminophen (PERCOCET/ROXICET) 5-325 MG tablet Take 1 tablet by mouth every 6 (six) hours as needed for severe pain.    Historical Provider, MD   predniSONE (DELTASONE) 10 MG tablet Take 10 mg by mouth daily with breakfast.    Historical Provider, MD  sevelamer carbonate (RENVELA) 800 MG tablet Take 1,600 mg by mouth 3 (three) times daily with meals.     Historical Provider, MD  warfarin (COUMADIN) 2 MG tablet Take 2 mg by mouth daily. Take with 43m tablet to equal 763m7/12/17   Historical Provider, MD  warfarin (COUMADIN) 5 MG tablet Take 5 mg by mouth See admin instructions. Take 1 tablet (5 mg) by mouth with a 2 mg tablet for a 7 mg dose 01/31/16   Historical Provider, MD    Physical Exam: Vitals:   07/14/16 1947 07/14/16 1951 07/14/16 2100  BP: (!) 173/114    Pulse: 76  69  Resp: 18  15  Temp: 98.8 F (37.1 C)    TempSrc: Oral    SpO2: 98%  96%  Weight:  74.8 kg (165 lb)   Height:  _0  (1.88 m)       Constitutional: NAD, calm, comfortable Eyes: PERTLA, lids and conjunctivae normal ENMT: Mucous membranes are moist. Posterior pharynx clear of any exudate or lesions.   Neck: normal, supple, no masses, no thyromegaly Respiratory: clear to auscultation bilaterally, no wheezing, no crackles. Normal respiratory effort.    Cardiovascular: S1 & S2 heard, regular rate and rhythm. No significant JVD. Abdomen: No distension, no tenderness, no masses palpated. Bowel sounds normal.  Musculoskeletal: no clubbing / cyanosis. No joint deformity upper and lower extremities. Normal muscle tone.  Skin: no significant rashes, lesions, ulcers. Warm, dry, well-perfused. Neurologic: CN 2-12 grossly intact. Sensation intact, DTR normal. Strength 5/5 in all 4 limbs.  Psychiatric:  Alert and oriented x 3. Calm.     Labs on Admission: I have personally reviewed following labs and imaging studies  CBC:  Recent Labs Lab 07/08/16 0426 07/10/16 1520 07/14/16 2029 07/14/16 2039  WBC  --  8.2 5.6  --   NEUTROABS  --  7.2  --   --   HGB 12.6* 11.0* 9.2* 10.2*  HCT 37.0* 33.3* 28.8* 30.0*  MCV  --  85.6 86.2  --   PLT  --  267 186  --     Basic Metabolic Panel:  Recent Labs Lab 07/10/16 1520 07/10/16 1801 07/10/16 2038 07/11/16 0817 07/14/16 2039  NA 131* 135 133* 135 137  K >7.5* 5.4* 5.4* 4.6 6.0*  CL 95* 97* 95* 94* 102  CO2 _0 --   GLUCOSE 107* 86 165* 113* 120*  BUN 56* 59* 56* 29* 68*  CREATININE 14.16* 14.48* 14.75* 9.72* 14.70*  CALCIUM 8.3* 8.5* 8.3* 8.6*  --   MG 2.0  --   --   --   --   PHOS  --   --  4.6  --   --    GFR: Estimated Creatinine Clearance: 6.2 mL/min (by C-G formula based on SCr of 14.7 mg/dL (H)). Liver Function Tests:  Recent Labs Lab 07/10/16 1520 07/10/16 2038  AST 28  --   ALT 11*  --   ALKPHOS 172*  --   BILITOT 1.4*  --   PROT 8.0  --   ALBUMIN 3.6 3.1*   No results for input(s): LIPASE, AMYLASE in the last 168 hours. No results for input(s): AMMONIA in the last 168 hours. Coagulation Profile:  Recent Labs Lab 07/10/16 1520  INR 1.07   Cardiac Enzymes: No results for input(s): CKTOTAL, CKMB, CKMBINDEX, TROPONINI in the last 168 hours. BNP (last 3 results) No results for input(s): PROBNP in the last 8760 hours. HbA1C: No results for input(s): HGBA1C in the last 72 hours. CBG:  Recent Labs Lab 07/10/16 1719  GLUCAP 114*   Lipid Profile: No results for input(s): CHOL, HDL, LDLCALC, TRIG, CHOLHDL, LDLDIRECT in the last 72 hours. Thyroid Function Tests: No results for input(s): TSH, T4TOTAL, FREET4, T3FREE, THYROIDAB in the last 72 hours. Anemia Panel: No results for input(s): VITAMINB12, FOLATE, FERRITIN, TIBC, IRON, RETICCTPCT in the last 72 hours. Urine analysis:    Component Value Date/Time   COLORURINE YELLOW 10/18/2013 [place] 10/18/2013 0419   LABSPEC 1.008 10/18/2013 0419   PHURINE 8.5 (H) 10/18/2013 0419   GLUCOSEU 100 (A) 10/18/2013 0419   HGBUR TRACE (A) 10/18/2013 0419   BILIRUBINUR NEGATIVE 10/18/2013 0419   KETONESUR NEGATIVE 10/18/2013 0419   PROTEINUR 100 (A) 10/18/2013 0419   UROBILINOGEN 0.2  10/18/2013 0419   NITRITE NEGATIVE 10/18/2013 0419   LEUKOCYTESUR NEGATIVE 10/18/2013 0419   Sepsis Labs: _1 (procalcitonin:4,lacticidven:4) )No results found for this or any previous visit (from the past 240 hour(s)).   Radiological Exams on Admission: No results found.  EKG: Independently reviewed. Sinus rhythm, LVH by voltage criteria with secondary repolarization abnormality.   Assessment/Plan  1. Hyperkalemia - Serum potassium 6.0 on admission without EKG changes  - This is secondary to ESRD with last HD on 07/10/16 per pt report, complicated by access problem as below  - The patient was admitted twice earlier this month and had rapid rise in his potassium with life-threatening arrhythmias prior to HD  - Nephrology was consulted and advised giving 50 g kayexalate; this was given to the patient to drink in the ED, but ED RN found the cup of kayexalate "hidden" in his ED room after he had been sent upstairs  - Plan to monitor on telemetry, encourage adherence to treatment plan, follow serial potassium levels with temporizing measures as needed    2. ESRD on HD with access problems  - Pt has fistula in distal RUE that has reportedly clotted and could not be accessed at HD on the the day of his presentation or the day prior  - Pt reports being told that he will need a temporary catheter placed and directed to the ED for this  - Given the Christmas holiday, IR only available for emergent cases and may not be able to place the line  until 12/26  - Given the recent hx of critical hyperkalemia and life-threatening arrhythmias, plan to monitor on telemetry with serial potassium levels for now   3. Hypertension with hypertensive urgency  - BP elevated to 175/115 range in ED without sxs  - BP improved with his evening meds  - Continue hydralazine, clonidine, Coreg, Norvasc as tolerated    4. Hx of DVT and PE - Pt has hx of DVT and PE and has been prescribed warfarin  - His INR is  1.03 on admission, similar to prior admissions   - There is no evidence for acute VTE on admission  - With compliance clearly an issue, and no evidence of acute VTE, plan to continue with sq heparin only for VTE ppx for now   5. Anemia of CKD  - Hgb is 9.2 on admission, slightly down from recent priors in 10-range, but with no evidence of bleeding, possibly d/t excess volume in setting of no HD for several day    DVT prophylaxis: warfarin  Code Status: Full  Family Communication: Discussed with patient Disposition Plan: Observe on telemetry Consults called: Nephrology  Admission status: Observation    Vianne Bulls, MD [facility] Pager [phone]  If 7PM-7AM, please contact night-coverage [email or web address] Password [email or web address]  07/14/2016, 11:19 PM

## 2016-07-14 NOTE — ED Notes (Signed)
Pt on drank about 25G of kayexalate. Attempted to talk pt into drinking more but he refused. Pt took remainder to [place] with him

## 2016-07-15 DIAGNOSIS — F419 Anxiety disorder, unspecified: Secondary | ICD-10-CM | POA: Diagnosis present

## 2016-07-15 DIAGNOSIS — Z992 Dependence on renal dialysis: Secondary | ICD-10-CM | POA: Diagnosis not present

## 2016-07-15 DIAGNOSIS — Z940 Kidney transplant status: Secondary | ICD-10-CM | POA: Diagnosis not present

## 2016-07-15 DIAGNOSIS — Z79899 Other long term (current) drug therapy: Secondary | ICD-10-CM | POA: Diagnosis not present

## 2016-07-15 DIAGNOSIS — F329 Major depressive disorder, single episode, unspecified: Secondary | ICD-10-CM | POA: Diagnosis present

## 2016-07-15 DIAGNOSIS — Z9119 Patient's noncompliance with other medical treatment and regimen: Secondary | ICD-10-CM | POA: Diagnosis not present

## 2016-07-15 DIAGNOSIS — Z86718 Personal history of other venous thrombosis and embolism: Secondary | ICD-10-CM | POA: Diagnosis not present

## 2016-07-15 DIAGNOSIS — Z87891 Personal history of nicotine dependence: Secondary | ICD-10-CM | POA: Diagnosis not present

## 2016-07-15 DIAGNOSIS — T82868A Thrombosis of vascular prosthetic devices, implants and grafts, initial encounter: Secondary | ICD-10-CM | POA: Diagnosis not present

## 2016-07-15 DIAGNOSIS — Z9115 Patient's noncompliance with renal dialysis: Secondary | ICD-10-CM | POA: Diagnosis not present

## 2016-07-15 DIAGNOSIS — E875 Hyperkalemia: Secondary | ICD-10-CM | POA: Diagnosis not present

## 2016-07-15 DIAGNOSIS — N185 Chronic kidney disease, stage 5: Secondary | ICD-10-CM | POA: Diagnosis not present

## 2016-07-15 DIAGNOSIS — K219 Gastro-esophageal reflux disease without esophagitis: Secondary | ICD-10-CM | POA: Diagnosis present

## 2016-07-15 DIAGNOSIS — I132 Hypertensive heart and chronic kidney disease with heart failure and with stage 5 chronic kidney disease, or end stage renal disease: Secondary | ICD-10-CM | POA: Diagnosis present

## 2016-07-15 DIAGNOSIS — Z7901 Long term (current) use of anticoagulants: Secondary | ICD-10-CM | POA: Diagnosis not present

## 2016-07-15 DIAGNOSIS — I5042 Chronic combined systolic (congestive) and diastolic (congestive) heart failure: Secondary | ICD-10-CM | POA: Diagnosis present

## 2016-07-15 DIAGNOSIS — N186 End stage renal disease: Secondary | ICD-10-CM | POA: Diagnosis not present

## 2016-07-15 DIAGNOSIS — I10 Essential (primary) hypertension: Secondary | ICD-10-CM | POA: Diagnosis not present

## 2016-07-15 DIAGNOSIS — I428 Other cardiomyopathies: Secondary | ICD-10-CM | POA: Diagnosis present

## 2016-07-15 DIAGNOSIS — Z86711 Personal history of pulmonary embolism: Secondary | ICD-10-CM | POA: Diagnosis not present

## 2016-07-15 DIAGNOSIS — D631 Anemia in chronic kidney disease: Secondary | ICD-10-CM | POA: Diagnosis not present

## 2016-07-15 DIAGNOSIS — I160 Hypertensive urgency: Secondary | ICD-10-CM | POA: Diagnosis present

## 2016-07-15 DIAGNOSIS — Y832 Surgical operation with anastomosis, bypass or graft as the cause of abnormal reaction of the patient, or of later complication, without mention of misadventure at the time of the procedure: Secondary | ICD-10-CM | POA: Diagnosis present

## 2016-07-15 LAB — BASIC METABOLIC PANEL
ANION GAP: 15 (ref 5–15)
BUN: 71 mg/dL — ABNORMAL HIGH (ref 6–20)
CO2: 20 mmol/L — AB (ref 22–32)
Calcium: 7.6 mg/dL — ABNORMAL LOW (ref 8.9–10.3)
Chloride: 98 mmol/L — ABNORMAL LOW (ref 101–111)
Creatinine, Ser: 14.15 mg/dL — ABNORMAL HIGH (ref 0.61–1.24)
GFR calc Af Amer: 4 mL/min — ABNORMAL LOW (ref >60)
GFR calc non Af Amer: 3 mL/min — ABNORMAL LOW (ref >60)
GLUCOSE: 89 mg/dL (ref 65–99)
POTASSIUM: 6 mmol/L — AB (ref 3.5–5.1)
Sodium: 133 mmol/L — ABNORMAL LOW (ref 135–145)

## 2016-07-15 LAB — GLUCOSE, CAPILLARY: Glucose-Capillary: 95 mg/dL (ref 65–99)

## 2016-07-15 LAB — POTASSIUM
POTASSIUM: 5.6 mmol/L — AB (ref 3.5–5.1)
POTASSIUM: 6.7 mmol/L — AB (ref 3.5–5.1)
Potassium: 6.5 mmol/L (ref 3.5–5.1)
Potassium: 5.6 mmol/L — ABNORMAL HIGH (ref 3.5–5.1)
Potassium: 5.3 mmol/L — ABNORMAL HIGH (ref 3.5–5.1)

## 2016-07-15 MED ORDER — HEPARIN SODIUM (PORCINE) 5000 UNIT/ML IJ SOLN: 5000.0000 [IU] | Freq: Three times a day (TID) | INTRAMUSCULAR | Status: DC

## 2016-07-15 MED ORDER — INSULIN ASPART 100 UNIT/ML IV SOLN
10.0000 [IU] | Freq: Once | INTRAVENOUS | Status: AC
  Administered 2016-07-15: 10 [IU] via INTRAVENOUS

## 2016-07-15 MED ORDER — FAMOTIDINE 20 MG PO TABS
20.0000 mg | ORAL_TABLET | Freq: Every day | ORAL | Status: DC
  Administered 2016-07-16: 20 mg via ORAL
  Filled 2016-07-15 (×2): qty 1

## 2016-07-15 MED ORDER — WARFARIN SODIUM 10 MG PO TABS
10.0000 mg | ORAL_TABLET | Freq: Once | ORAL | Status: DC
  Filled 2016-07-15: qty 1

## 2016-07-15 MED ORDER — INSULIN ASPART 100 UNIT/ML ~~LOC~~ SOLN
10.0000 [IU] | Freq: Once | SUBCUTANEOUS | Status: AC
  Administered 2016-07-15: 10 [IU] via SUBCUTANEOUS

## 2016-07-15 MED ORDER — SODIUM POLYSTYRENE SULFONATE 15 GM/60ML PO SUSP
30.0000 g | Freq: Once | ORAL | Status: AC
  Administered 2016-07-15: 30 g via ORAL
  Filled 2016-07-15: qty 120

## 2016-07-15 MED ORDER — DEXTROSE 50 % IV SOLN
50.0000 mL | Freq: Once | INTRAVENOUS | Status: AC
  Administered 2016-07-15: 50 mL via INTRAVENOUS
  Filled 2016-07-15: qty 50

## 2016-07-15 MED ORDER — WARFARIN - PHARMACIST DOSING INPATIENT
Freq: Every day | Status: DC
Start: 2016-07-15 — End: 2016-07-15

## 2016-07-15 MED ORDER — SODIUM CHLORIDE 0.9 % IV SOLN
1.0000 g | Freq: Once | INTRAVENOUS | Status: AC
  Administered 2016-07-15: 1 g via INTRAVENOUS
  Filled 2016-07-15: qty 10

## 2016-07-15 MED ORDER — DEXTROSE 50 % IV SOLN
25.0000 g | Freq: Once | INTRAVENOUS | Status: AC
  Administered 2016-07-15: 25 g via INTRAVENOUS
  Filled 2016-07-15: qty 50

## 2016-07-15 NOTE — Progress Notes (Signed)
CRITICAL VALUE ALERT  Critical value received:  K = 6.7  Date of notification:  07-15-2016  Time of notification:  19:00  Critical value read back:Yes.    Nurse who received alert:  Genelle Bal, RN  MD notified (1st page):  Raliegh Ip Schorr  Time of first page:  19:04  MD notified (2nd page):  Time of second page:  Responding MD:  Lamar Blinks  Time MD responded:  19:28

## 2016-07-15 NOTE — Progress Notes (Signed)
ANTICOAGULATION CONSULT NOTE - Initial Consult  Pharmacy Consult for Coumadin Indication: h/o DVT  Allergies  Allergen Reactions  . Butalbital-Apap-Caffeine Shortness Of Breath, Swelling and Other (See Comments)    Swelling in throat  . Ferrlecit [Na Ferric Gluc Cplx In Sucrose] Shortness Of Breath, Swelling and Other (See Comments)    Swelling in throat  . Minoxidil Shortness Of Breath  . Tylenol [Acetaminophen] Anaphylaxis and Swelling  . Darvocet [Propoxyphene N-Acetaminophen] Hives    Patient Measurements: Height: _0  (188 cm) Weight: 165 lb (74.8 kg) IBW/kg (Calculated) : 82.2  Vital Signs: Temp: 98.8 F (37.1 C) (12/24 1947) Temp Source: Oral (12/24 1947) BP: 160/100 (12/24 2254) Pulse Rate: 69 (12/24 2100)  Labs:  Recent Labs  07/14/16 2029 07/14/16 2039 07/14/16 2315  HGB 9.2* 10.2*  --   HCT 28.8* 30.0*  --   PLT 186  --   --   LABPROT  --   --  13.5  INR  --   --  1.03  CREATININE  --  14.70*  --     Estimated Creatinine Clearance: 6.2 mL/min (by C-G formula based on SCr of 14.7 mg/dL (H)).   Medical History: Past Medical History:  Diagnosis Date  . Anemia   . Anxiety   . Chronic combined systolic and diastolic CHF (congestive heart failure) (HCC)    a. EF 20-25% by echo in 08/2015 b. echo 10/2015: EF 35-40%, diffuse HK, severe LAE, moderate RAE, small pericardial effusion  . Complication of anesthesia    itching, sore throat  . Depression   . Dialysis patient ([place])   . ESRD (end stage renal disease) ([place])    due to HTN per patient, followed at [facility], s/p failed kidney transplant - dialysis Tue, Th, Sat  . Hyperkalemia 12/2015  . Hypertension   . Junctional rhythm    a. noted in 08/2015: hyperkalemic at that time  b. 12/2015: presented in junctional rhythm w/ K+ of 6.6. Resolved with improvement of K+ levels.  . Nonischemic cardiomyopathy ([place])    a. 08/2014: cath showing minimal CAD, but tortuous arteries noted.   . Personal history of  DVT (deep vein thrombosis)/ PE 05/26/2016   In Oct 2015 had small subsemental LUL PE w/o DVT (LE dopplers neg) and was felt to be HD cath related, treated w coumadin.  IN May 2016 had small vein DVT (acute/subacute) in the R basilic/ brachial veins of the RUE, resumed on coumadin.  Had R sided HD cath at that time.    . Renal insufficiency   . Shortness of breath   . Type II diabetes mellitus (HCC)    No history per patient, but remains under history as A1c would not be accurate given on dialysis    Medications:  No current facility-administered medications on file prior to encounter.    Current Outpatient Prescriptions on File Prior to Encounter  Medication Sig Dispense Refill  . allopurinol (ZYLOPRIM) 100 MG tablet Take 100 mg by mouth daily.    [name] amLODipine (NORVASC) 10 MG tablet Take 10 mg by mouth 2 (two) times daily.     [name] atorvastatin (LIPITOR) 40 MG tablet Take 1 tablet (40 mg total) by mouth daily at 6 PM. 30 tablet 1  . carvedilol (COREG) 25 MG tablet Take 25 mg by mouth 2 (two) times daily with a meal.    . cinacalcet (SENSIPAR) 30 MG tablet Take 30 mg by mouth every evening.     . cloNIDine (CATAPRES -  DOSED IN MG/24 HR) 0.3 mg/24hr patch Place 0.3 mg onto the skin once a week.    . doxazosin (CARDURA) 2 MG tablet Take 2 mg by mouth daily.    . famotidine (PEPCID) 20 MG tablet Take 1 tablet (20 mg total) by mouth 2 (two) times daily. 30 tablet 0  . gabapentin (NEURONTIN) 100 MG capsule Take 1 capsule (100 mg total) by mouth at bedtime. (Patient taking differently: Take 100 mg by mouth 3 (three) times daily. )    . hydrALAZINE (APRESOLINE) 100 MG tablet Take 100 mg by mouth 2 (two) times daily.     . isosorbide mononitrate (IMDUR) 60 MG 24 hr tablet Take 1 tablet (60 mg total) by mouth daily.    . methocarbamol (ROBAXIN) 500 MG tablet Take 1 tablet (500 mg total) by mouth 2 (two) times daily as needed for muscle spasms. 10 tablet 0  . omeprazole (PRILOSEC) 20 MG capsule Take 20 mg  by mouth daily.    . ondansetron (ZOFRAN ODT) 4 MG disintegrating tablet Take 1 tablet (4 mg total) by mouth every 8 (eight) hours as needed for nausea or vomiting. 10 tablet 0  . oxycodone (OXY-IR) 5 MG capsule Take 1 capsule (5 mg total) by mouth every 4 (four) hours as needed for pain. 5 capsule 0  . oxyCODONE-acetaminophen (PERCOCET/ROXICET) 5-325 MG tablet Take 1 tablet by mouth every 6 (six) hours as needed for severe pain.    . predniSONE (DELTASONE) 10 MG tablet Take 10 mg by mouth daily with breakfast.    . sevelamer carbonate (RENVELA) 800 MG tablet Take 1,600 mg by mouth 3 (three) times daily with meals.     . warfarin (COUMADIN) 2 MG tablet Take 2 mg by mouth daily. Take with 17m tablet to equal 751m   . warfarin (COUMADIN) 5 MG tablet Take 5 mg by mouth See admin instructions. Take 1 tablet (5 mg) by mouth with a 2 mg tablet for a 7 mg dose       Assessment: [age].o. male admitted with hyperkalemia/clotted AVF, h/o DVT, to continue Coumadin.    Goal of Therapy:  INR 2-3 Monitor platelets by anticoagulation protocol: Yes   Plan:  Coumadin 10 mg tonight Daily INR  AbCaryl Pina2/25/2017,12:09 AM

## 2016-07-15 NOTE — Progress Notes (Signed)
Patient wants to take his kayexalate later. He knows the importance of medication but wants to take it later.

## 2016-07-15 NOTE — Progress Notes (Signed)
CRITICAL LAB VALUE POTASSIUM: 6.5 On call notified.  Ermalinda Memos, RN

## 2016-07-15 NOTE — ED Notes (Signed)
After pt was transported to floor this RN was cleaning room in ED and found cup with lid and ice half full with Kayexelate. It appears that this pt did not drink any of the medication- instead he attempted to hide meds. RN upstairs aware

## 2016-07-15 NOTE — Progress Notes (Signed)
[facility]                                                                              Patient Demographics  Frank [name], is a 50 y.o. male, DOB - 04-06-1966, PFR:[id number]  Admit date - 07/14/2016   Admitting Physician Vianne Bulls, MD  Outpatient Primary MD for the patient is Angelica Chessman, MD  Outpatient specialists:   LOS - 0  days    Chief Complaint  Patient presents with  . Vascular Access Problem       Brief summary   Frank [name] is a 50 y.o. male with medical history significant for end-stage renal disease on hemodialysis, history of DVT and PE, nonischemic cardiomyopathy, hypertension, GERD, and poor adherence with his treatment plan Presented to ED after sent by outpatient dialysis facility for evaluation of the problem accessing his fistula, complicated by hyperkalemia. Patient reported development of nausea with nonbloody nonbilious vomiting and fatigue, worsening over the past few days. He describes the symptoms as typical for him when he has gone a few days without dialysis  In 80, creatinine 14.7, potassium 6.0.   Assessment & Plan    Principal Problem:  Hyperkalemia - Serum potassium 6.0 on admission without EKG changes  - Last HD on 99/41, complicated by access issues. Nephrology consulted and advised giving 50 g kayexalate; this was given to the patient to drink in the ED, but ED RN found the cup of kayexalate "hidden" in his ED room after he had been sent upstairs  - Plan to monitor on telemetry, encourage adherence to treatment plan, follow serial potassium levels with temporizing measures as needed until the access is placed    ESRD on HD with access problems  - Pt has fistula in distal RUE that has reportedly clotted and could not be accessed at HD  - consulted nephrology for further recommendations, discussed with Dr. Jonnie Finner   Hypertension with hypertensive urgency  - BP elevated to 175/115 range in ED  - Restarted  hydralazine, clonidine, Coreg, Norvasc   Hx of DVT and PE - Pt has hx of DVT and PE and has been prescribed warfarin  - His INR is 1.03 on admission, similar to prior admissions, no evidence for acute VTE on admission  - With compliance clearly an issue, and no evidence of acute VTE, plan to continue with sq heparin only for VTE  prophylaxis for now   Anemia of CKD/ESRD  - Hgb is 9.2 on admission, slightly down from recent priors in 10-range, but with no evidence of bleeding, possibly d/t excess volume in setting of no HD for several day    Code Status: Full code  DVT Prophylaxis:   heparin Family Communication: Discussed in detail with the patient, all imaging results, lab results explained to the patient   Disposition Plan:   Time Spent in minutes   25 minutes  Procedures:    Consultants:   Nephrology*  Antimicrobials:      Medications  Scheduled Meds: . allopurinol  100 mg Oral Daily  . amLODipine  10 mg Oral BID  . atorvastatin  40 mg Oral  q1800  . carvedilol  25 mg Oral BID WC  . cinacalcet  30 mg Oral QPM  . cloNIDine  0.3 mg Transdermal Weekly  . doxazosin  2 mg Oral Daily  . [START ON 07/16/2016] famotidine  20 mg Oral Daily  . gabapentin  100 mg Oral QHS  . heparin subcutaneous  5,000 Units Subcutaneous Q8H  . hydrALAZINE  100 mg Oral BID  . isosorbide mononitrate  60 mg Oral Daily  . pantoprazole  40 mg Oral Daily  . predniSONE  10 mg Oral Q breakfast  . sevelamer carbonate  1,600 mg Oral TID WC  . sodium chloride flush  3 mL Intravenous Q12H  . sodium chloride flush  3 mL Intravenous Q12H   Continuous Infusions: PRN Meds:.sodium chloride, hydrALAZINE, HYDROmorphone, methocarbamol, ondansetron **OR** ondansetron (ZOFRAN) IV, oxyCODONE-acetaminophen, sodium chloride flush   Antibiotics   Anti-infectives    None        Subjective:   Frank [name] was seen and examined today.  Requesting pain medications otherwise no complaints.  Patient  denies dizziness, chest pain, shortness of breath, abdominal pain, N/V/D/C, new weakness, numbess, tingling. No acute events overnight.    Objective:   Vitals:   07/15/16 0142 07/15/16 0451 07/15/16 0541 07/15/16 0900  BP: (!) 153/89 (!) 183/102 (!) 174/89 (!) 170/99  Pulse: 71 84 80 79  Resp: 16 18    Temp:  98.7 F (37.1 C)  98.2 F (36.8 C)  TempSrc:  Oral  Oral  SpO2: 99% 96%  98%  Weight:      Height:        Intake/Output Summary (Last 24 hours) at 07/15/16 1210 Last data filed at 07/15/16 0900  Gross per 24 hour  Intake             1806 ml  Output                0 ml  Net             1806 ml     Wt Readings from Last 3 Encounters:  07/15/16 76.3 kg (168 lb 3.4 oz)  07/14/16 74.8 kg (165 lb)  07/11/16 75.7 kg (166 lb 14.2 oz)     Exam  General: Alert and oriented x 3, NAD  HEENT:  PERRLA, EOMI, Anicteric Sclera, mucous membranes moist.   Neck: Supple, no JVD, no masses  Cardiovascular: S1 S2 auscultated, no rubs, murmurs or gallops. Regular rate and rhythm.  Respiratory: Clear to auscultation bilaterally, no wheezing, rales or rhonchi  Gastrointestinal: Soft, nontender, nondistended, + bowel sounds  Ext: no cyanosis clubbing or edema  Neuro: AAOx3, Cr N's II- XII. Strength 5/5 upper and lower extremities bilaterally  Skin: No rashes  Psych: Normal affect and demeanor, alert and oriented x3    Data Reviewed:  I have personally reviewed following labs and imaging studies  Micro Results No results found for this or any previous visit (from the past 240 hour(s)).  Radiology Reports Portable Chest 1 View  Result Date: 07/14/2016 CLINICAL DATA:  Pain to forearm dialysis fistula. EXAM: PORTABLE CHEST 1 VIEW COMPARISON:  07/10/2016 FINDINGS: The cardiac silhouette is enlarged. Mediastinal contours appear intact. There is no evidence of focal airspace consolidation, or pleural effusion. Asymmetric lucency of the left lung may be artifactual. Osseous  structures are without acute abnormality. Soft tissues are grossly normal. IMPRESSION: No definite evidence of active disease. Enlarged cardiac silhouette. Asymmetric lucency of the left lung may be artifactual. If  there is clinical suspicion for left pneumothorax, decubital chest radiograph, right side down, may be considered. Electronically Signed   By: Fidela Salisbury M.D.   On: 07/14/2016 23:50   Dg Chest Portable 1 View  Result Date: 07/10/2016 CLINICAL DATA:  Right side chest pain EXAM: PORTABLE CHEST 1 VIEW COMPARISON:  06/09/2016 FINDINGS: Cardiomegaly is noted. No infiltrate or pleural effusion. No pulmonary edema. IMPRESSION: Cardiomegaly.  No active disease. Electronically Signed   By: Lahoma Crocker M.D.   On: 07/10/2016 16:00    Lab Data:  CBC:  Recent Labs Lab 07/10/16 1520 07/14/16 2029 07/14/16 2039  WBC 8.2 5.6  --   NEUTROABS 7.2  --   --   HGB 11.0* 9.2* 10.2*  HCT 33.3* 28.8* 30.0*  MCV 85.6 86.2  --   PLT 267 186  --    Basic Metabolic Panel:  Recent Labs Lab 07/10/16 1520 07/10/16 1801 07/10/16 2038 07/11/16 0817 07/14/16 2039 07/14/16 2315 07/15/16 0150 07/15/16 1031  NA 131* 135 133* 135 137 133*  --   --   K >7.5* 5.4* 5.4* 4.6 6.0* 6.0* 6.5* 5.6*  CL 95* 97* 95* 94* 102 98*  --   --   CO2 _0 --  20*  --   --   GLUCOSE 107* 86 165* 113* 120* 89  --   --   BUN 56* 59* 56* 29* 68* 71*  --   --   CREATININE 14.16* 14.48* 14.75* 9.72* 14.70* 14.15*  --   --   CALCIUM 8.3* 8.5* 8.3* 8.6*  --  7.6*  --   --   MG 2.0  --   --   --   --   --   --   --   PHOS  --   --  4.6  --   --   --   --   --    GFR: Estimated Creatinine Clearance: 6.6 mL/min (by C-G formula based on SCr of 14.15 mg/dL (H)). Liver Function Tests:  Recent Labs Lab 07/10/16 1520 07/10/16 2038  AST 28  --   ALT 11*  --   ALKPHOS 172*  --   BILITOT 1.4*  --   PROT 8.0  --   ALBUMIN 3.6 3.1*   No results for input(s): LIPASE, AMYLASE in the last 168 hours. No  results for input(s): AMMONIA in the last 168 hours. Coagulation Profile:  Recent Labs Lab 07/10/16 1520 07/14/16 2315  INR 1.07 1.03   Cardiac Enzymes: No results for input(s): CKTOTAL, CKMB, CKMBINDEX, TROPONINI in the last 168 hours. BNP (last 3 results) No results for input(s): PROBNP in the last 8760 hours. HbA1C: No results for input(s): HGBA1C in the last 72 hours. CBG:  Recent Labs Lab 07/10/16 1719 07/15/16 0815  GLUCAP 114* 95   Lipid Profile: No results for input(s): CHOL, HDL, LDLCALC, TRIG, CHOLHDL, LDLDIRECT in the last 72 hours. Thyroid Function Tests: No results for input(s): TSH, T4TOTAL, FREET4, T3FREE, THYROIDAB in the last 72 hours. Anemia Panel: No results for input(s): VITAMINB12, FOLATE, FERRITIN, TIBC, IRON, RETICCTPCT in the last 72 hours. Urine analysis:    Component Value Date/Time   COLORURINE YELLOW 10/18/2013 [place] 10/18/2013 0419   LABSPEC 1.008 10/18/2013 0419   PHURINE 8.5 (H) 10/18/2013 0419   GLUCOSEU 100 (A) 10/18/2013 0419   HGBUR TRACE (A) 10/18/2013 [place] NEGATIVE 10/18/2013 [place] 10/18/2013 0419  PROTEINUR 100 (A) 10/18/2013 0419   UROBILINOGEN 0.2 10/18/2013 0419   NITRITE NEGATIVE 10/18/2013 0419   LEUKOCYTESUR NEGATIVE 10/18/2013 0419     Lindia Garms M.D. [facility] 07/15/2016, 12:10 PM  Pager: [phone] Between 7am to 7pm - call Pager - [phone]  After 7pm go to [email or web address] - password [email or web address]  Call night coverage person covering after 7pm

## 2016-07-15 NOTE — Progress Notes (Signed)
New Admission Note:  Arrival Method: Stretcher from ED Mental Orientation: A&O x4 Telemetry: [address], CCMD notified Assessment: Completed Skin: Assessed with Vassie Moselle, RN, no skin issues noted IV: R FA, flushed and saline locked Pain: 0/10 Tubes: N/A Safety Measures: Safety Fall Prevention Plan discussed with patient. Admission: Completed [place] Orientation: Patient has been orientated to the room, unit and the staff. Family: None at bedside  Orders have been reviewed and implemented. Will continue to monitor the patient. Call light has been placed within reach.  Nena Polio BSN, RN  Phone Number: [phone]

## 2016-07-15 NOTE — Consult Note (Signed)
Renal Service Consult Note [facility]  Frank [name] 07/15/2016 [name] Requesting Physician:  Dr Tana Coast  Reason for Consult:  ESRD pt with clotted AVF HPI: The patient is a 50 y.o. year-old with history of DM2, HTN, NICM and ESRD on HD, hx failed renal transplant was recently accepted for outpatient HD at [facility] unit after finally getting a permanent HD access which was an AVF which was placed at [facility].  His HD cath was removed last week and he had inpatient HD 3 x last week and was to have his first OP HD on Saturday 12/23, but there was some problem with the AVF.  He returned the next day which was yesterday but the AVF still could not be accessed and he was sent to the ED.  K was high, he took 50 gm kayexalate and today is down to 5.6.  Asked to see pt for esrd.   Patient is w/o complaints, says he had 3 HD sessions last week on Sunday , Tues and Wed.  No SOB, cough or CP, no F/C/S.  No abd pain or n/v/d.    History of syst/ diast CHF.  Hx of HTN.    Hist of cath-related PE in 2015 and also prob cath-related R upper arm DVT in 2016, was treated for both with coumadin.  That same catheter he had was just removed last week.       ROS  denies CP  no joint pain   no HA  no blurry vision  no rash  no diarrhea  no nausea/ vomiting  no dysuria  no difficulty voiding  no change in urine color    Past Medical History  Past Medical History:  Diagnosis Date  . Anemia   . Anxiety   . Chronic combined systolic and diastolic CHF (congestive heart failure) (HCC)    a. EF 20-25% by echo in 08/2015 b. echo 10/2015: EF 35-40%, diffuse HK, severe LAE, moderate RAE, small pericardial effusion  . Complication of anesthesia    itching, sore throat  . Depression   . Dialysis patient ([place])   . ESRD (end stage renal disease) ([place])    due to HTN per patient, followed at [facility], s/p failed kidney transplant - dialysis Tue, Th, Sat  . Hyperkalemia 12/2015  .  Hypertension   . Junctional rhythm    a. noted in 08/2015: hyperkalemic at that time  b. 12/2015: presented in junctional rhythm w/ K+ of 6.6. Resolved with improvement of K+ levels.  . Nonischemic cardiomyopathy ([place])    a. 08/2014: cath showing minimal CAD, but tortuous arteries noted.   . Personal history of DVT (deep vein thrombosis)/ PE 05/26/2016   In Oct 2015 had small subsemental LUL PE w/o DVT (LE dopplers neg) and was felt to be HD cath related, treated w coumadin.  IN May 2016 had small vein DVT (acute/subacute) in the R basilic/ brachial veins of the RUE, resumed on coumadin.  Had R sided HD cath at that time.    . Renal insufficiency   . Shortness of breath   . Type II diabetes mellitus (HCC)    No history per patient, but remains under history as A1c would not be accurate given on dialysis   Past Surgical History  Past Surgical History:  Procedure Laterality Date  . CAPD INSERTION    . CAPD REMOVAL    . INGUINAL HERNIA REPAIR Right 02/14/2015   Procedure: REPAIR INCARCERATED RIGHT INGUINAL HERNIA;  Surgeon: [name], MD;  Location: [place];  Service: General;  Laterality: Right;  . INSERTION OF DIALYSIS CATHETER Right 09/23/2015   Procedure: exchange of Right internal Dialysis Catheter.;  Surgeon: [name], MD;  Location: [place];  Service: Vascular;  Laterality: Right;  . KIDNEY RECEIPIENT  2006   failed and started HD in March 2014  . LEFT HEART CATHETERIZATION WITH CORONARY ANGIOGRAM N/A 09/02/2014   Procedure: LEFT HEART CATHETERIZATION WITH CORONARY ANGIOGRAM;  Surgeon: [name], MD;  Location[facility] C[facility];  Service: Cardiovascular;  Laterality: N/A;   Family History  Family History  Problem Relation Age of Onset  . Hypertension Other    Social History  reports that he has quit smoking. His smoking use included Cigarettes. He smoked 0.00 packs per day for 1.00 year. He has never used smokeless tobacco. He reports that he does not drink alcohol or use  drugs. Allergies  Allergies  Allergen Reactions  . Butalbital-Apap-Caffeine Shortness Of Breath, Swelling and Other (See Comments)    Swelling in throat  . Ferrlecit [Na Ferric Gluc Cplx In Sucrose] Shortness Of Breath, Swelling and Other (See Comments)    Swelling in throat  . Minoxidil Shortness Of Breath  . Tylenol [Acetaminophen] Anaphylaxis and Swelling  . Darvocet [Propoxyphene N-Acetaminophen] Hives   Home medications Prior to Admission medications   Medication Sig Start Date End Date Taking? Authorizing Provider  allopurinol (ZYLOPRIM) 100 MG tablet Take 100 mg by mouth daily.    Historical Provider, MD  amLODipine (NORVASC) 10 MG tablet Take 10 mg by mouth 2 (two) times daily.     Historical Provider, MD  atorvastatin (LIPITOR) 40 MG tablet Take 1 tablet (40 mg total) by mouth daily at 6 PM. 09/02/14   Barton Dubois, MD  carvedilol (COREG) 25 MG tablet Take 25 mg by mouth 2 (two) times daily with a meal.    Historical Provider, MD  cinacalcet (SENSIPAR) 30 MG tablet Take 30 mg by mouth every evening.     Historical Provider, MD  cloNIDine (CATAPRES - DOSED IN MG/24 HR) 0.3 mg/24hr patch Place 0.3 mg onto the skin once a week.    Historical Provider, MD  doxazosin (CARDURA) 2 MG tablet Take 2 mg by mouth daily.    Historical Provider, MD  famotidine (PEPCID) 20 MG tablet Take 1 tablet (20 mg total) by mouth 2 (two) times daily. 02/13/16   Charlesetta Shanks, MD  gabapentin (NEURONTIN) 100 MG capsule Take 1 capsule (100 mg total) by mouth at bedtime. Patient taking differently: Take 100 mg by mouth 3 (three) times daily.  05/21/16   [name], DO  hydrALAZINE (APRESOLINE) 100 MG tablet Take 100 mg by mouth 2 (two) times daily.     Historical Provider, MD  isosorbide mononitrate (IMDUR) 60 MG 24 hr tablet Take 1 tablet (60 mg total) by mouth daily. 01/09/16   Smiley Houseman, MD  methocarbamol (ROBAXIN) 500 MG tablet Take 1 tablet (500 mg total) by mouth 2 (two) times daily as  needed for muscle spasms. 07/11/16   [name], MD  omeprazole (PRILOSEC) 20 MG capsule Take 20 mg by mouth daily. 07/01/13   Historical Provider, MD  ondansetron (ZOFRAN ODT) 4 MG disintegrating tablet Take 1 tablet (4 mg total) by mouth every 8 (eight) hours as needed for nausea or vomiting. 05/16/16   Sherwood Gambler, MD  oxycodone (OXY-IR) 5 MG capsule Take 1 capsule (5 mg total) by mouth every 4 (four)  hours as needed for pain. 05/30/16   [name], MD  oxyCODONE-acetaminophen (PERCOCET/ROXICET) 5-325 MG tablet Take 1 tablet by mouth every 6 (six) hours as needed for severe pain.    Historical Provider, MD  predniSONE (DELTASONE) 10 MG tablet Take 10 mg by mouth daily with breakfast.    Historical Provider, MD  sevelamer carbonate (RENVELA) 800 MG tablet Take 1,600 mg by mouth 3 (three) times daily with meals.     Historical Provider, MD  warfarin (COUMADIN) 2 MG tablet Take 2 mg by mouth daily. Take with 54m tablet to equal 746m7/12/17   Historical Provider, MD  warfarin (COUMADIN) 5 MG tablet Take 5 mg by mouth See admin instructions. Take 1 tablet (5 mg) by mouth with a 2 mg tablet for a 7 mg dose 01/31/16   Historical Provider, MD   Liver Function Tests  Recent Labs Lab 07/10/16 1520 07/10/16 2038  AST 28  --   ALT 11*  --   ALKPHOS 172*  --   BILITOT 1.4*  --   PROT 8.0  --   ALBUMIN 3.6 3.1*   No results for input(s): LIPASE, AMYLASE in the last 168 hours. CBC  Recent Labs Lab 07/10/16 1520 07/14/16 2029 07/14/16 2039  WBC 8.2 5.6  --   NEUTROABS 7.2  --   --   HGB 11.0* 9.2* 10.2*  HCT 33.3* 28.8* 30.0*  MCV 85.6 86.2  --   PLT 267 186  --    Basic Metabolic Panel  Recent Labs Lab 07/10/16 1520 07/10/16 1801 07/10/16 2038 07/11/16 0817 07/14/16 2039 07/14/16 2315 07/15/16 0150 07/15/16 1031  NA 131* 135 133* 135 137 133*  --   --   K >7.5* 5.4* 5.4* 4.6 6.0* 6.0* 6.5* 5.6*  CL 95* 97* 95* 94* 102 98*  --   --   CO2 _0 --  20*  --   --    GLUCOSE 107* 86 165* 113* 120* 89  --   --   BUN 56* 59* 56* 29* 68* 71*  --   --   CREATININE 14.16* 14.48* 14.75* 9.72* 14.70* 14.15*  --   --   CALCIUM 8.3* 8.5* 8.3* 8.6*  --  7.6*  --   --   PHOS  --   --  4.6  --   --   --   --   --    Iron/TIBC/Ferritin/ %Sat    Component Value Date/Time   IRON 41 (L) 05/29/2016 1350   TIBC 190 (L) 05/29/2016 1350   FERRITIN 328 11/02/2015 0346   IRONPCTSAT 22 05/29/2016 1350    Vitals:   07/15/16 0142 07/15/16 0451 07/15/16 0541 07/15/16 0900  BP: (!) 153/89 (!) 183/102 (!) 174/89 (!) 170/99  Pulse: 71 84 80 79  Resp: 16 18    Temp:  98.7 F (37.1 C)  98.2 F (36.8 C)  TempSrc:  Oral  Oral  SpO2: 99% 96%  98%  Weight:      Height:       Exam Gen alert, diffuse dry skin w peeling, no distress, calm No rash, cyanosis or gangrene Sclera anicteric, throat clear  No jvd or bruits Chest clear bilat RRR soft SEM , no RG Abd soft ntnd no mass or ascites +bs GU normal male MS no joint effusions or deformity Ext 1+ LE edema / no wounds or ulcers Neuro is alert, Ox 3 , nf L forearm AVF no thrill/  no bruit, no drainage / erythema/ fluctuance     Dialysis: TTS [facility] was supposed to start 12/23 but AVF was clotted 4h   65kg   2/2.25 bath  Hep 2000    L forearm AVF (R IJ cath dc'd last week)     Assessment: 1. Clotted L arm AVF - this is a new AVF just started using 1-2 wks ago and a mature looking AVF, not sure what the issue is.  Will ask IR to evaluate for thrombolysis.  No HD access today, will repeat K in am, sp kayexalate already today x 1.   2. Hyperkalemia - as above 3. ESRD now on TTS HD in [place] 4. Hx failed renal Tx 5. NICM - last EF was 35-40% in April 2017 6. Anemia of CKD - Hb 10's, no esa for now here 7. Hx VTE - hist of PE '15 and RUE DVT '16 - two separate incidents, both likely result of long-term indwelling HD cath.  Catheter is now out.  Would not keep on coumadin, would dc.      Plan - IR consult as above,  HD pending restoration of access  Kelly Splinter MD [place] pager [phone]   07/15/2016, 1:39 PM

## 2016-07-16 ENCOUNTER — Encounter (HOSPITAL_COMMUNITY): Payer: Self-pay | Admitting: Physician Assistant

## 2016-07-16 ENCOUNTER — Inpatient Hospital Stay (HOSPITAL_COMMUNITY): Payer: Medicare Other

## 2016-07-16 HISTORY — PX: IR GENERIC HISTORICAL: IMG1180011

## 2016-07-16 LAB — POTASSIUM
POTASSIUM: 4.8 mmol/L (ref 3.5–5.1)
Potassium: 5.5 mmol/L — ABNORMAL HIGH (ref 3.5–5.1)

## 2016-07-16 LAB — PROTIME-INR
INR: 1.04
PROTHROMBIN TIME: 13.6 s (ref 11.4–15.2)

## 2016-07-16 LAB — GLUCOSE, CAPILLARY: Glucose-Capillary: 142 mg/dL — ABNORMAL HIGH (ref 65–99)

## 2016-07-16 MED ORDER — LIDOCAINE HCL 1 % IJ SOLN
INTRAMUSCULAR | Status: AC | PRN
  Administered 2016-07-16: 10 mL

## 2016-07-16 MED ORDER — ALTEPLASE 2 MG IJ SOLR
INTRAMUSCULAR | Status: AC
  Filled 2016-07-16: qty 2

## 2016-07-16 MED ORDER — FENTANYL CITRATE (PF) 100 MCG/2ML IJ SOLN
INTRAMUSCULAR | Status: AC
  Filled 2016-07-16: qty 4

## 2016-07-16 MED ORDER — DIPHENHYDRAMINE HCL 50 MG/ML IJ SOLN
INTRAMUSCULAR | Status: AC
  Filled 2016-07-16: qty 1

## 2016-07-16 MED ORDER — IOPAMIDOL (ISOVUE-300) INJECTION 61%
INTRAVENOUS | Status: AC
  Filled 2016-07-16: qty 100

## 2016-07-16 MED ORDER — LIDOCAINE HCL (PF) 1 % IJ SOLN
INTRAMUSCULAR | Status: AC
  Filled 2016-07-16: qty 30

## 2016-07-16 MED ORDER — ALTEPLASE 2 MG IJ SOLR
INTRAMUSCULAR | Status: AC | PRN
  Administered 2016-07-16: 3 mg

## 2016-07-16 MED ORDER — DIPHENHYDRAMINE HCL 50 MG/ML IJ SOLN
INTRAMUSCULAR | Status: AC | PRN
  Administered 2016-07-16: 50 mg via INTRAVENOUS

## 2016-07-16 MED ORDER — MIDAZOLAM HCL 2 MG/2ML IJ SOLN
INTRAMUSCULAR | Status: AC
  Filled 2016-07-16: qty 6

## 2016-07-16 MED ORDER — HEPARIN SODIUM (PORCINE) 5000 UNIT/ML IJ SOLN: 5000.0000 [IU] | Freq: Three times a day (TID) | INTRAMUSCULAR | Status: DC

## 2016-07-16 MED ORDER — MIDAZOLAM HCL 2 MG/2ML IJ SOLN
INTRAMUSCULAR | Status: AC | PRN
Start: 2016-07-16 — End: 2016-07-16
  Administered 2016-07-16 (×3): 1 mg via INTRAVENOUS
  Administered 2016-07-16: 2 mg via INTRAVENOUS
  Administered 2016-07-16: 1 mg via INTRAVENOUS

## 2016-07-16 MED ORDER — FENTANYL CITRATE (PF) 100 MCG/2ML IJ SOLN
INTRAMUSCULAR | Status: AC | PRN
  Administered 2016-07-16 (×4): 50 ug via INTRAVENOUS

## 2016-07-16 NOTE — Discharge Summary (Signed)
[facility] Discharge Summary   Patient: Frank [name] ZSW:[id number]   PCP: Angelica Chessman, MD DOB: 12/19/1965   Date of admission: 07/10/2016   Date of discharge: 07/11/2016    Discharge Diagnoses:  Active Problems:   Anemia of chronic kidney failure   ESRD on hemodialysis (HCC)   Nonischemic cardiomyopathy (HCC)   Bradycardia   Hyperkalemia   Hypertension   Admitted From: home Disposition:  home  Recommendations for Outpatient Follow-up:  1. Establish care with ESRD clinic at [facility].   Follow-up Information    JEGEDE, OLUGBEMIGA, MD. Schedule an appointment as soon as possible for a visit in 1 week(s).   Specialty:  Internal Medicine Contact information: [address] [place] [address] [phone]          Diet recommendation: Renal diet  Activity: The patient is advised to gradually reintroduce usual activities.  Discharge Condition: good  Code Status: Full code  History of present illness: As per the H and P dictated on admission, "Frank [name] is a 50 y.o. male with medical history of 50 y.o.malewith medical history significant for end-stage renal disease on hemodialysis, history of DVT and PE, nonischemic cardiomyopathy, hypertension, GERD, and nonadherence with his treatment plan who presents to the emergency department with nausea, generalized malaise, dizziness, fatigue and palpitations that began just prior to arrival. Patient has been dependent on the hospital for his hemodialysis and was dialyzed 07/08/2016 in the hospital.  The patient is supposed to be on warfarin, but is noncompliant and has had numerous subtherapeutic INRs. The patient had his tunneled dialysis catheter removed at [facility] on 07/09/2016. He complains of pain at the site without any bloody drainage or purulent drainage. He actually arrived at [facility] earlier in the day on 07/10/2016 for his scheduled dialysis,  but according to the patient he states that he  was told that he needs to go to his established dialysis Center in [place] for his scheduled dialysis on 07/11/2016.  Subsequently later in the afternoon, he developed the above symptoms and came to the emergency department at  [facility] Course:   Summary of his active problems in the hospital is as following. 1. Hyperkalemia with bradyarrhythmia  - Pt presents with dizziness, palpitations and fatigue and found to have potassium of >7.5 with bradyarrhythmia rate in low 40s - Temporizing measures given in ED and HR is coming up to 60s in ED  - Nephrology consulted ED Stable after hemodialysis to be discharged.  2. ESRD on HD  - Pt has established with outpatient HD clinic in [place] - He has AVF that has matured - [facility] was removed 12/19 - Nephrology has arranged for outpatient HD  3. Hx of DVT, PE  - There is no evidence of acute VTE  - Pt is prescribed warfarin, but is not adherent--numerous subtherapeutic INRs  -  History of remote PE/DVT in 2015 and 2016, this is likely related to his dialysis catheter.  4. Hypertension  - He is managed at home with Norvasc, clonidine, Coreg, hydralazine;  5. Normocytic anemia  - Hgb is 11.0 on admission and apparently stable with no evidence for bleeding   6. GERD - No EGD report on file  - Managed with Pepcid and Protonix at home and these will be continued   7. Nonischemic cardiomyopathy - Last EF measured in April 2017 was 35-40%  - Volume management per nephrology.  All other chronic medical condition were stable during the hospitalization.  Patient was ambulatory without any assistance. On the day of the discharge the patient's vitals were stable, and no other acute medical condition were reported by patient. the patient was felt safe to be discharge at home with family.  Procedures and Results:  HD   Consultations:  nephrology  DISCHARGE MEDICATION: Discharge Medication List as of 07/11/2016  2:50 PM      START taking these medications   Details  methocarbamol (ROBAXIN) 500 MG tablet Take 1 tablet (500 mg total) by mouth 2 (two) times daily as needed for muscle spasms., Starting Thu 07/11/2016, Normal      CONTINUE these medications which have NOT CHANGED   Details  allopurinol (ZYLOPRIM) 100 MG tablet Take 100 mg by mouth daily., Historical Med    amLODipine (NORVASC) 10 MG tablet Take 10 mg by mouth 2 (two) times daily. , Historical Med    atorvastatin (LIPITOR) 40 MG tablet Take 1 tablet (40 mg total) by mouth daily at 6 PM., Starting Fri 09/02/2014, Print    carvedilol (COREG) 25 MG tablet Take 25 mg by mouth 2 (two) times daily with a meal., Historical Med    cinacalcet (SENSIPAR) 30 MG tablet Take 30 mg by mouth every evening. , Historical Med    cloNIDine (CATAPRES - DOSED IN MG/24 HR) 0.3 mg/24hr patch Place 0.3 mg onto the skin once a week., Historical Med    doxazosin (CARDURA) 2 MG tablet Take 2 mg by mouth daily., Historical Med    famotidine (PEPCID) 20 MG tablet Take 1 tablet (20 mg total) by mouth 2 (two) times daily., Starting Tue 02/13/2016, Print    gabapentin (NEURONTIN) 100 MG capsule Take 1 capsule (100 mg total) by mouth at bedtime., Starting Tue 05/21/2016, No Print    hydrALAZINE (APRESOLINE) 100 MG tablet Take 100 mg by mouth 2 (two) times daily. , Historical Med    isosorbide mononitrate (IMDUR) 60 MG 24 hr tablet Take 1 tablet (60 mg total) by mouth daily., Starting Tue 01/09/2016, No Print    omeprazole (PRILOSEC) 20 MG capsule Take 20 mg by mouth daily., Starting Thu 07/01/2013, Historical Med    ondansetron (ZOFRAN ODT) 4 MG disintegrating tablet Take 1 tablet (4 mg total) by mouth every 8 (eight) hours as needed for nausea or vomiting., Starting Thu 05/16/2016, Print    oxycodone (OXY-IR) 5 MG capsule Take 1 capsule (5 mg total) by mouth every 4 (four) hours as needed for pain., Starting Thu 05/30/2016, Print    oxyCODONE-acetaminophen (PERCOCET/ROXICET)  5-325 MG tablet Take 1 tablet by mouth every 6 (six) hours as needed for severe pain., Historical Med    predniSONE (DELTASONE) 10 MG tablet Take 10 mg by mouth daily with breakfast., Historical Med    sevelamer carbonate (RENVELA) 800 MG tablet Take 1,600 mg by mouth 3 (three) times daily with meals. , Historical Med    !! warfarin (COUMADIN) 2 MG tablet Take 2 mg by mouth daily. Take with 61m tablet to equal 729m Starting Wed 01/31/2016, Historical Med    !! warfarin (COUMADIN) 5 MG tablet Take 5 mg by mouth See admin instructions. Take 1 tablet (5 mg) by mouth with a 2 mg tablet for a 7 mg dose, Starting Wed 01/31/2016, Historical Med     !! - Potential duplicate medications found. Please discuss with provider.     Allergies  Allergen Reactions  . Butalbital-Apap-Caffeine Shortness Of Breath, Swelling and Other (See Comments)    Swelling in throat  . Ferrlecit [NLauro Regulus  Ferric Gluc Cplx In Sucrose] Shortness Of Breath, Swelling and Other (See Comments)    Swelling in throat  . Minoxidil Shortness Of Breath  . Tylenol [Acetaminophen] Anaphylaxis and Swelling  . Darvocet [Propoxyphene N-Acetaminophen] Hives   Discharge Instructions    Diet renal [id number]    Complete by:  As directed    Increase activity slowly    Complete by:  As directed      Discharge Exam: Filed Weights   07/11/16 0026 07/11/16 0046 07/11/16 0940  Weight: 75.7 kg (166 lb 14.2 oz) 75.7 kg (166 lb 14.2 oz) 75.7 kg (166 lb 14.2 oz)   Vitals:   07/11/16 1200 07/11/16 1230  BP: 134/90 130/77  Pulse: 92 86  Resp:    Temp:     General: Appear in no distress, no Rash; Oral Mucosa moist. Cardiovascular: S1 and S2 Present, njpo Murmur, no JVD Respiratory: Bilateral Air entry present and Clear to Auscultation, no Crackles, no wheezes Abdomen: Bowel Sound present, Soft and no tenderness Extremities: no Pedal edema, no calf tenderness Neurology: Grossly no focal neuro deficit.  The results of significant  diagnostics from this hospitalization (including imaging, microbiology, ancillary and laboratory) are listed below for reference.    Significant Diagnostic Studies:  Dg Chest Portable 1 View  Result Date: 07/10/2016 CLINICAL DATA:  Right side chest pain EXAM: PORTABLE CHEST 1 VIEW COMPARISON:  06/09/2016 FINDINGS: Cardiomegaly is noted. No infiltrate or pleural effusion. No pulmonary edema. IMPRESSION: Cardiomegaly.  No active disease. Electronically Signed   By: Lahoma Crocker M.D.   On: 07/10/2016 16:00    Microbiology: No results found for this or any previous visit (from the past 240 hour(s)).   Time spent: 30 minutes  Signed:  Berle Mull  [facility] 07/11/2016  , 8:31 AM

## 2016-07-16 NOTE — Progress Notes (Signed)
Patient refusing telemetry to be back on. " Dont come back till in the morning."

## 2016-07-16 NOTE — Discharge Summary (Signed)
Physician Discharge Summary   Patient ID: Frank [name] MRN: [id number] DOB/AGE: [birth year] 50 y.o.  Admit date: 07/14/2016 Discharge date: 07/16/2016  Primary Care Physician:  Angelica Chessman, MD  Discharge Diagnoses:   . Problem with dialysis access ([place]) . Hyperkalemia . Anemia associated with chronic renal failure . ESRD on HD . Hypertension . Medical noncompliance . Hypertensive urgency   Consults:  Nephrology Interventional radiology  outpatient Follow-up:  1. Please repeat CBC/BMET at next visit 2. Patient will continue HD at outpatient dialysis center tomorrow per his request    DIET: renal diet    Allergies:   Allergies  Allergen Reactions  . Butalbital-Apap-Caffeine Shortness Of Breath, Swelling and Other (See Comments)    Swelling in throat  . Ferrlecit [Na Ferric Gluc Cplx In Sucrose] Shortness Of Breath, Swelling and Other (See Comments)    Swelling in throat  . Minoxidil Shortness Of Breath  . Tylenol [Acetaminophen] Anaphylaxis and Swelling  . Darvocet [Propoxyphene N-Acetaminophen] Hives     DISCHARGE MEDICATIONS: Current Discharge Medication List    CONTINUE these medications which have NOT CHANGED   Details  allopurinol (ZYLOPRIM) 100 MG tablet Take 100 mg by mouth daily.    amLODipine (NORVASC) 10 MG tablet Take 10 mg by mouth 2 (two) times daily.     atorvastatin (LIPITOR) 40 MG tablet Take 1 tablet (40 mg total) by mouth daily at 6 PM. Qty: 30 tablet, Refills: 1    carvedilol (COREG) 25 MG tablet Take 25 mg by mouth 2 (two) times daily with a meal.    cinacalcet (SENSIPAR) 30 MG tablet Take 30 mg by mouth every evening.     cloNIDine (CATAPRES - DOSED IN MG/24 HR) 0.3 mg/24hr patch Place 0.3 mg onto the skin once a week.    doxazosin (CARDURA) 2 MG tablet Take 2 mg by mouth daily.    famotidine (PEPCID) 20 MG tablet Take 1 tablet (20 mg total) by mouth 2 (two) times daily. Qty: 30 tablet, Refills: 0    gabapentin (NEURONTIN)  100 MG capsule Take 1 capsule (100 mg total) by mouth at bedtime.    hydrALAZINE (APRESOLINE) 100 MG tablet Take 100 mg by mouth 2 (two) times daily.     isosorbide mononitrate (IMDUR) 60 MG 24 hr tablet Take 1 tablet (60 mg total) by mouth daily.    methocarbamol (ROBAXIN) 500 MG tablet Take 1 tablet (500 mg total) by mouth 2 (two) times daily as needed for muscle spasms. Qty: 10 tablet, Refills: 0    omeprazole (PRILOSEC) 20 MG capsule Take 20 mg by mouth daily.    ondansetron (ZOFRAN ODT) 4 MG disintegrating tablet Take 1 tablet (4 mg total) by mouth every 8 (eight) hours as needed for nausea or vomiting. Qty: 10 tablet, Refills: 0    oxycodone (OXY-IR) 5 MG capsule Take 1 capsule (5 mg total) by mouth every 4 (four) hours as needed for pain. Qty: 5 capsule, Refills: 0    oxyCODONE-acetaminophen (PERCOCET/ROXICET) 5-325 MG tablet Take 1 tablet by mouth every 6 (six) hours as needed for severe pain.    predniSONE (DELTASONE) 10 MG tablet Take 10 mg by mouth daily with breakfast.    sevelamer carbonate (RENVELA) 800 MG tablet Take 1,600 mg by mouth 3 (three) times daily with meals.     !! warfarin (COUMADIN) 2 MG tablet Take 2 mg by mouth daily. Take with 25m tablet to equal 742m   !! warfarin (COUMADIN) 5 MG tablet Take 5 mg  by mouth See admin instructions. Take 1 tablet (5 mg) by mouth with a 2 mg tablet for a 7 mg dose     !! - Potential duplicate medications found. Please discuss with provider.       Brief H and P: For complete details please refer to admission H and P, but in brief Frank [name] a 50 y.o.malewith medical history significant forend-stage renal disease on hemodialysis, history of DVT and PE, nonischemic cardiomyopathy, hypertension, GERD, and poor adherence with his treatment plan Presented to ED after sent by outpatient dialysis facility for evaluation of the problem accessing his fistula, complicated by hyperkalemia. Patient reported development of nausea  with nonbloody nonbilious vomiting and fatigue, worsening over the past few days. He describes the symptoms as typical for him when he has gone a few days without dialysis  In ED, creatinine 14.7, potassium 6.0.  Hospital Course:  Hyperkalemia - Serum potassium 6.0 on admission without EKG changes  - Last HD on 07/62, complicated by access issues. Nephrology was  consulted and advised giving 50 g kayexalate; this was given to the patient to drink in the ED, but ED RN found the cup of kayexalate "hidden" in his ED room after he had been sent upstairs  - Potassium was improved to 4.8 today  ESRD on HD with access problems  - Pt has fistula in distal RUE that has reportedly clotted and could not be accessed at HD  - Interventional radiology was consulted, patient had IR guided thrombectomy and thrombolysis of the AV fistula with good return of the flow - Status post declot procedure by the IR, patient refused hemodialysis stating that "his arm is too sore to be stuck" and requested for food and discharge home. I was called by nephrology, Dr Jonnie Finner that patient is cleared to be discharged home and he can go to his outpatient dialysis center for hemodialysis tomorrow morning.      Hypertension with hypertensive urgency  - BP elevated to 175/115 range in ED  - Restarted hydralazine, clonidine, Coreg, Norvasc   Hx of DVT and PE - Pt has hx of DVT and PE and has been prescribed warfarin  - His INR is 1.03 on admission, similar to prior admissions, no evidence for acute VTE on admission.continue warfarin per home regimen    Anemia of CKD/ESRD  - Hgb is 9.2 on admission, slightly down from recent priors in 10-range, but with no evidence of bleeding, possibly d/t excess volume in setting of no HD for several day     Day of Discharge BP (!) 188/110   Pulse 76   Temp 97.8 F (36.6 C) (Oral)   Resp 18   Ht _0  (1.88 m)   Wt 77.1 kg (169 lb 15.6 oz)   SpO2 96% Comment: R AIR  BMI  21.82 kg/m   Physical Exam: General: Alert and awake oriented x3 not in any acute distress. HEENT: anicteric sclera, pupils reactive to light and accommodation CVS: S1-S2 clear no murmur rubs or gallops Chest: clear to auscultation bilaterally, no wheezing rales or rhonchi Abdomen: soft nontender, nondistended, normal bowel sounds Extremities: no cyanosis, clubbing or edema noted bilaterally Neuro: Cranial nerves II-XII intact, no focal neurological deficits   The results of significant diagnostics from this hospitalization (including imaging, microbiology, ancillary and laboratory) are listed below for reference.    LAB RESULTS: Basic Metabolic Panel:  Recent Labs Lab 07/10/16 1520  07/10/16 2038 07/11/16 0817 07/14/16 2039 07/14/16 2315  07/16/16  7026 07/16/16 0611  NA 131*  < > 133* 135 137 133*  --   --   --   K >7.5*  < > 5.4* 4.6 6.0* 6.0*  < > 5.5* 4.8  CL 95*  < > 95* 94* 102 98*  --   --   --   CO2 23  < > 24 26  --  20*  --   --   --   GLUCOSE 107*  < > 165* 113* 120* 89  --   --   --   BUN 56*  < > 56* 29* 68* 71*  --   --   --   CREATININE 14.16*  < > 14.75* 9.72* 14.70* 14.15*  --   --   --   CALCIUM 8.3*  < > 8.3* 8.6*  --  7.6*  --   --   --   MG 2.0  --   --   --   --   --   --   --   --   PHOS  --   --  4.6  --   --   --   --   --   --   < > = values in this interval not displayed. Liver Function Tests:  Recent Labs Lab 07/10/16 1520 07/10/16 2038  AST 28  --   ALT 11*  --   ALKPHOS 172*  --   BILITOT 1.4*  --   PROT 8.0  --   ALBUMIN 3.6 3.1*   No results for input(s): LIPASE, AMYLASE in the last 168 hours. No results for input(s): AMMONIA in the last 168 hours. CBC:  Recent Labs Lab 07/10/16 1520 07/14/16 2029 07/14/16 2039  WBC 8.2 5.6  --   NEUTROABS 7.2  --   --   HGB 11.0* 9.2* 10.2*  HCT 33.3* 28.8* 30.0*  MCV 85.6 86.2  --   PLT 267 186  --    Cardiac Enzymes: No results for input(s): CKTOTAL, CKMB, CKMBINDEX, TROPONINI in  the last 168 hours. BNP: Invalid input(s): POCBNP CBG:  Recent Labs Lab 07/15/16 0815 07/16/16 0743  GLUCAP 95 142*    Significant Diagnostic Studies:  Portable Chest 1 View  Result Date: 07/14/2016 CLINICAL DATA:  Pain to forearm dialysis fistula. EXAM: PORTABLE CHEST 1 VIEW COMPARISON:  07/10/2016 FINDINGS: The cardiac silhouette is enlarged. Mediastinal contours appear intact. There is no evidence of focal airspace consolidation, or pleural effusion. Asymmetric lucency of the left lung may be artifactual. Osseous structures are without acute abnormality. Soft tissues are grossly normal. IMPRESSION: No definite evidence of active disease. Enlarged cardiac silhouette. Asymmetric lucency of the left lung may be artifactual. If there is clinical suspicion for left pneumothorax, decubital chest radiograph, right side down, may be considered. Electronically Signed   By: Fidela Salisbury M.D.   On: 07/14/2016 23:50    2D ECHO:   Disposition and Follow-up: Discharge Instructions    Diet - low sodium heart healthy    Complete by:  As directed    Increase activity slowly    Complete by:  As directed        DISPOSITION: [place]    JEGEDE, OLUGBEMIGA, MD. Schedule an appointment as soon as possible for a visit in 2 week(s).   Specialty:  Internal Medicine Contact information: [place] [address] [phone]  Time spent on Discharge: 59mns   Signed:   Carder Yin M.D. [facility] 07/16/2016, 2:30 PM Pager: [phone]

## 2016-07-16 NOTE — Sedation Documentation (Signed)
Pt extremely restless, will not hold still

## 2016-07-16 NOTE — Progress Notes (Signed)
Frank [name] to be D/C'd Home per MD order.  Discussed prescriptions and follow up appointments with the patient. Prescriptions given to patient, medication list explained in detail. Pt verbalized understanding.  Allergies as of 07/16/2016      Reactions   Butalbital-apap-caffeine Shortness Of Breath, Swelling, Other (See Comments)   Swelling in throat   Ferrlecit [na Ferric Gluc Cplx In Sucrose] Shortness Of Breath, Swelling, Other (See Comments)   Swelling in throat   Minoxidil Shortness Of Breath   Tylenol [acetaminophen] Anaphylaxis, Swelling   Darvocet [propoxyphene N-acetaminophen] Hives      Medication List    TAKE these medications   allopurinol 100 MG tablet Commonly known as:  ZYLOPRIM Take 100 mg by mouth daily.   amLODipine 10 MG tablet Commonly known as:  NORVASC Take 10 mg by mouth 2 (two) times daily.   atorvastatin 40 MG tablet Commonly known as:  LIPITOR Take 1 tablet (40 mg total) by mouth daily at 6 PM.   carvedilol 25 MG tablet Commonly known as:  COREG Take 25 mg by mouth 2 (two) times daily with a meal.   cinacalcet 30 MG tablet Commonly known as:  SENSIPAR Take 30 mg by mouth every evening.   cloNIDine 0.3 mg/24hr patch Commonly known as:  CATAPRES - Dosed in mg/24 hr Place 0.3 mg onto the skin once a week.   doxazosin 2 MG tablet Commonly known as:  CARDURA Take 2 mg by mouth daily.   famotidine 20 MG tablet Commonly known as:  PEPCID Take 1 tablet (20 mg total) by mouth 2 (two) times daily.   gabapentin 100 MG capsule Commonly known as:  NEURONTIN Take 1 capsule (100 mg total) by mouth at bedtime. What changed:  when to take this   hydrALAZINE 100 MG tablet Commonly known as:  APRESOLINE Take 100 mg by mouth 2 (two) times daily.   isosorbide mononitrate 60 MG 24 hr tablet Commonly known as:  IMDUR Take 1 tablet (60 mg total) by mouth daily.   methocarbamol 500 MG tablet Commonly known as:  ROBAXIN Take 1 tablet (500 mg total) by  mouth 2 (two) times daily as needed for muscle spasms.   omeprazole 20 MG capsule Commonly known as:  PRILOSEC Take 20 mg by mouth daily.   ondansetron 4 MG disintegrating tablet Commonly known as:  ZOFRAN ODT Take 1 tablet (4 mg total) by mouth every 8 (eight) hours as needed for nausea or vomiting.   oxycodone 5 MG capsule Commonly known as:  OXY-IR Take 1 capsule (5 mg total) by mouth every 4 (four) hours as needed for pain.   oxyCODONE-acetaminophen 5-325 MG tablet Commonly known as:  PERCOCET/ROXICET Take 1 tablet by mouth every 6 (six) hours as needed for severe pain.   predniSONE 10 MG tablet Commonly known as:  DELTASONE Take 10 mg by mouth daily with breakfast.   sevelamer carbonate 800 MG tablet Commonly known as:  RENVELA Take 1,600 mg by mouth 3 (three) times daily with meals.   warfarin 2 MG tablet Commonly known as:  COUMADIN Take 2 mg by mouth daily. Take with 55m tablet to equal 759m  warfarin 5 MG tablet Commonly known as:  COUMADIN Take 5 mg by mouth See admin instructions. Take 1 tablet (5 mg) by mouth with a 2 mg tablet for a 7 mg dose       Skin clean, dry and intact without evidence of skin break down, no evidence of skin tears  noted. IV catheter discontinued intact. Site without signs and symptoms of complications. Dressing and pressure applied. Pt denies pain at this time. No complaints noted.  An After Visit Summary was printed and given to the patient. Patient escorted via [place], and D/C home via private auto.  [name] BSN, RN [facility] Phone [phone]

## 2016-07-16 NOTE — Sedation Documentation (Signed)
Patient is resting comfortably. 

## 2016-07-16 NOTE — H&P (Signed)
Chief Complaint: ESRD on hemodialysis Thrombosed AV fistula  Referring Physician(s): Roney Jaffe  Supervising Physician: Corrie Mckusick  Patient Status: [facility] - In-pt  History of Present Illness: Frank [name] is a 50 y.o. male with medical history significant for end-stage renal disease on hemodialysis, history of DVT and PE, nonischemic cardiomyopathy, hypertension, GERD.  He presented the ED at the direction of his outpatient dialysis facility for evaluation of the problem accessing his fistula, complicated by hyperkalemia.   He reports that he was last dialyzed on 07/10/2016 and reports completing the session without incident and at that time.   He reports going to his dialysis center on the 24th but the fistula was apparently clotted and could not be accessed.   We are asked attempt thrombectomy of the fistula.  The left forearm arteriovenous fistula was created at [facility].  He is supposed to be taking warfarin for DVT/PE but has not taken this in about 2 weeks.  He is NPO.  Past Medical History:  Diagnosis Date  . Anemia   . Anxiety   . Chronic combined systolic and diastolic CHF (congestive heart failure) (HCC)    a. EF 20-25% by echo in 08/2015 b. echo 10/2015: EF 35-40%, diffuse HK, severe LAE, moderate RAE, small pericardial effusion  . Complication of anesthesia    itching, sore throat  . Depression   . Dialysis patient ([place])   . ESRD (end stage renal disease) ([place])    due to HTN per patient, followed at [facility], s/p failed kidney transplant - dialysis Tue, Th, Sat  . Hyperkalemia 12/2015  . Hypertension   . Junctional rhythm    a. noted in 08/2015: hyperkalemic at that time  b. 12/2015: presented in junctional rhythm w/ K+ of 6.6. Resolved with improvement of K+ levels.  . Nonischemic cardiomyopathy ([place])    a. 08/2014: cath showing minimal CAD, but tortuous arteries noted.   . Personal history of DVT (deep vein thrombosis)/ PE 05/26/2016   In Oct 2015 had  small subsemental LUL PE w/o DVT (LE dopplers neg) and was felt to be HD cath related, treated w coumadin.  IN May 2016 had small vein DVT (acute/subacute) in the R basilic/ brachial veins of the RUE, resumed on coumadin.  Had R sided HD cath at that time.    . Renal insufficiency   . Shortness of breath   . Type II diabetes mellitus (HCC)    No history per patient, but remains under history as A1c would not be accurate given on dialysis    Past Surgical History:  Procedure Laterality Date  . CAPD INSERTION    . CAPD REMOVAL    . INGUINAL HERNIA REPAIR Right 02/14/2015   Procedure: REPAIR INCARCERATED RIGHT INGUINAL HERNIA;  Surgeon: [name], MD;  Location: [place];  Service: General;  Laterality: Right;  . INSERTION OF DIALYSIS CATHETER Right 09/23/2015   Procedure: exchange of Right internal Dialysis Catheter.;  Surgeon: [name], MD;  Location: [place];  Service: Vascular;  Laterality: Right;  . KIDNEY RECEIPIENT  2006   failed and started HD in March 2014  . LEFT HEART CATHETERIZATION WITH CORONARY ANGIOGRAM N/A 09/02/2014   Procedure: LEFT HEART CATHETERIZATION WITH CORONARY ANGIOGRAM;  Surgeon: [name], MD;  Location: [facility] CATH LAB;  Service: Cardiovascular;  Laterality: N/A;    Allergies: Butalbital-apap-caffeine; Ferrlecit [na ferric gluc cplx in sucrose]; Minoxidil; Tylenol [acetaminophen]; and Darvocet [propoxyphene n-acetaminophen]  Medications: Prior to Admission medications  Medication Sig Start Date End Date Taking? Authorizing Provider  allopurinol (ZYLOPRIM) 100 MG tablet Take 100 mg by mouth daily.    Historical Provider, MD  amLODipine (NORVASC) 10 MG tablet Take 10 mg by mouth 2 (two) times daily.     Historical Provider, MD  atorvastatin (LIPITOR) 40 MG tablet Take 1 tablet (40 mg total) by mouth daily at 6 PM. 09/02/14   Barton Dubois, MD  carvedilol (COREG) 25 MG tablet Take 25 mg by mouth 2 (two) times daily with a meal.    Historical Provider, MD    cinacalcet (SENSIPAR) 30 MG tablet Take 30 mg by mouth every evening.     Historical Provider, MD  cloNIDine (CATAPRES - DOSED IN MG/24 HR) 0.3 mg/24hr patch Place 0.3 mg onto the skin once a week.    Historical Provider, MD  doxazosin (CARDURA) 2 MG tablet Take 2 mg by mouth daily.    Historical Provider, MD  famotidine (PEPCID) 20 MG tablet Take 1 tablet (20 mg total) by mouth 2 (two) times daily. 02/13/16   Charlesetta Shanks, MD  gabapentin (NEURONTIN) 100 MG capsule Take 1 capsule (100 mg total) by mouth at bedtime. Patient taking differently: Take 100 mg by mouth 3 (three) times daily.  05/21/16   [name], DO  hydrALAZINE (APRESOLINE) 100 MG tablet Take 100 mg by mouth 2 (two) times daily.     Historical Provider, MD  isosorbide mononitrate (IMDUR) 60 MG 24 hr tablet Take 1 tablet (60 mg total) by mouth daily. 01/09/16   Smiley Houseman, MD  methocarbamol (ROBAXIN) 500 MG tablet Take 1 tablet (500 mg total) by mouth 2 (two) times daily as needed for muscle spasms. 07/11/16   [name], MD  omeprazole (PRILOSEC) 20 MG capsule Take 20 mg by mouth daily. 07/01/13   Historical Provider, MD  ondansetron (ZOFRAN ODT) 4 MG disintegrating tablet Take 1 tablet (4 mg total) by mouth every 8 (eight) hours as needed for nausea or vomiting. 05/16/16   Sherwood Gambler, MD  oxycodone (OXY-IR) 5 MG capsule Take 1 capsule (5 mg total) by mouth every 4 (four) hours as needed for pain. 05/30/16   [name], MD  oxyCODONE-acetaminophen (PERCOCET/ROXICET) 5-325 MG tablet Take 1 tablet by mouth every 6 (six) hours as needed for severe pain.    Historical Provider, MD  predniSONE (DELTASONE) 10 MG tablet Take 10 mg by mouth daily with breakfast.    Historical Provider, MD  sevelamer carbonate (RENVELA) 800 MG tablet Take 1,600 mg by mouth 3 (three) times daily with meals.     Historical Provider, MD  warfarin (COUMADIN) 2 MG tablet Take 2 mg by mouth daily. Take with 10m tablet to equal 737m7/12/17    Historical Provider, MD  warfarin (COUMADIN) 5 MG tablet Take 5 mg by mouth See admin instructions. Take 1 tablet (5 mg) by mouth with a 2 mg tablet for a 7 mg dose 01/31/16   Historical Provider, MD     Family History  Problem Relation Age of Onset  . Hypertension Other     Social History   Social History  . Marital status: Married    Spouse name: N/A  . Number of children: 3  . Years of education: [employer]   Social History Main Topics  . Smoking status: Former Smoker    Packs/day: 0.00    Years: 1.00    Types: Cigarettes  . Smokeless tobacco: Never Used     Comment:  quit Jan 2014  . Alcohol use No  . Drug use: No  . Sexual activity: Not Currently   Other Topics Concern  . 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 Negative for activity change, chills, fatigue and fever.  HENT: Negative.   Respiratory: Negative.   Cardiovascular: Negative.   Gastrointestinal: Positive for nausea and vomiting. Negative for abdominal pain.  Genitourinary: Negative.   Musculoskeletal: Negative.   Skin: Negative.   Neurological: Negative.   Hematological: Negative.   Psychiatric/Behavioral: Negative.     Vital Signs: BP (!) 184/90 (BP Location: Right Arm)   Pulse 67   Temp 97.8 F (36.6 C) (Oral)   Resp 18   Ht _0  (1.88 m)   Wt 169 lb 15.6 oz (77.1 kg)   SpO2 96%   BMI 21.82 kg/m   Physical Exam  Constitutional: He is oriented to person, place, and time. He appears well-developed and well-nourished.  HENT:  Head: Normocephalic and atraumatic.  Eyes: EOM are normal.  Neck: Normal range of motion.  Cardiovascular: Normal rate, regular rhythm and normal heart sounds.   Pulmonary/Chest: Effort normal. No respiratory distress. He has no wheezes.  Abdominal: Soft. He exhibits no distension. There is no tenderness.  Musculoskeletal: Normal range  of motion.       Arms: Neurological: He is alert and oriented to person, place, and time.  Skin: Skin is warm and dry.  Psychiatric: He has a normal mood and affect. His behavior is normal. Judgment and thought content normal.  Vitals reviewed.   Mallampati Score:  MD Evaluation Airway: WNL Heart: WNL Abdomen: WNL Chest/ Lungs: WNL ASA  Classification: 3 Mallampati/Airway Score: One  Imaging: Portable Chest 1 View  Result Date: 07/14/2016 CLINICAL DATA:  Pain to forearm dialysis fistula. EXAM: PORTABLE CHEST 1 VIEW COMPARISON:  07/10/2016 FINDINGS: The cardiac silhouette is enlarged. Mediastinal contours appear intact. There is no evidence of focal airspace consolidation, or pleural effusion. Asymmetric lucency of the left lung may be artifactual. Osseous structures are without acute abnormality. Soft tissues are grossly normal. IMPRESSION: No definite evidence of active disease. Enlarged cardiac silhouette. Asymmetric lucency of the left lung may be artifactual. If there is clinical suspicion for left pneumothorax, decubital chest radiograph, right side down, may be considered. Electronically Signed   By: Fidela Salisbury M.D.   On: 07/14/2016 23:50   Dg Chest Portable 1 View  Result Date: 07/10/2016 CLINICAL DATA:  Right side chest pain EXAM: PORTABLE CHEST 1 VIEW COMPARISON:  06/09/2016 FINDINGS: Cardiomegaly is noted. No infiltrate or pleural effusion. No pulmonary edema. IMPRESSION: Cardiomegaly.  No active disease. Electronically Signed   By: Lahoma Crocker M.D.   On: 07/10/2016 16:00    Labs:  CBC:  Recent Labs  07/04/16 2318  07/06/16 0829 07/08/16 0426 07/10/16 1520 07/14/16 2029 07/14/16 2039  WBC 5.5  --  5.8  --  8.2 5.6  --   HGB 11.0*  < > 9.7* 12.6* 11.0* 9.2* 10.2*  HCT 34.4*  < > 30.7* 37.0* 33.3* 28.8* 30.0*  PLT 206  --  205  --  267 186  --   < > = values in this interval not displayed.  COAGS:  Recent Labs  09/23/15 1655  10/19/15 2134   05/30/16 1945  06/29/16 0745 07/10/16 1520 07/14/16 2315 07/16/16 0611  INR 1.32  < >  2.72*  < > 1.38  < > 1.15 1.07 1.03 1.04  APTT 26  --  48*  --  28  --   --   --   --   --   < > = values in this interval not displayed.  BMP:  Recent Labs  07/10/16 1801 07/10/16 2038 07/11/16 0817 07/14/16 2039 07/14/16 2315  07/15/16 1813 07/15/16 2206 07/16/16 0156 07/16/16 0611  NA 135 133* 135 137 133*  --   --   --   --   --   K 5.4* 5.4* 4.6 6.0* 6.0*  < > 6.7* 5.3* 5.5* 4.8  CL 97* 95* 94* 102 98*  --   --   --   --   --   CO2 _0 --  20*  --   --   --   --   --   GLUCOSE 86 165* 113* 120* 89  --   --   --   --   --   BUN 59* 56* 29* 68* 71*  --   --   --   --   --   CALCIUM 8.5* 8.3* 8.6*  --  7.6*  --   --   --   --   --   CREATININE 14.48* 14.75* 9.72* 14.70* 14.15*  --   --   --   --   --   GFRNONAA 3* 3* 5*  --  3*  --   --   --   --   --   GFRAA 4* 4* 6*  --  4*  --   --   --   --   --   < > = values in this interval not displayed.  LIVER FUNCTION TESTS:  Recent Labs  06/17/16 0022  06/24/16 2014  07/04/16 2318 07/06/16 0829 07/10/16 1520 07/10/16 2038  BILITOT 0.6  --  0.3  --  0.5  --  1.4*  --   AST 17  --  23  --  24  --  28  --   ALT 9*  --  12*  --  11*  --  11*  --   ALKPHOS 159*  --  145*  --  184*  --  172*  --   PROT 7.5  --  7.2  --  7.8  --  8.0  --   ALBUMIN 3.4*  < > 3.4*  < > 3.4* 3.1* 3.6 3.1*  < > = values in this interval not displayed.  TUMOR MARKERS: No results for input(s): AFPTM, CEA, CA199, CHROMGRNA in the last 8760 hours.  Assessment and Plan:  ESRD on hemodialysis now with thrombosed left forearm arteriovenous fistula.  Will attempt thrombectomy today, if unsuccessful, will proceed with tunneled hemodialysis catheter placement.  Risks and Benefits discussed with the patient including, but not limited to bleeding, infection, vascular injury, pulmonary embolism, need for tunneled HD catheter placement or even death.  Risks  and Benefits discussed with the patient including, but not limited to bleeding, infection, vascular injury, pneumothorax which may require chest tube placement, air embolism or even death  All of the patient's questions were answered, patient is agreeable to proceed. Consent signed and in chart.  Thank you for this interesting consult.  I greatly enjoyed meeting Frank [name] and look forward to participating in their care.  A copy of this report was sent to the requesting provider on this date.  Electronically  Signed: Murrell Redden PA-C 07/16/2016, 8:51 AM   I spent a total of 40 Minutes in face to face in clinical consultation, greater than 50% of which was counseling/coordinating care for thrombectomy of fistula/ possible HD catheter placement

## 2016-07-16 NOTE — Progress Notes (Signed)
Patient arrived to HD from IR, post thrombo.  Patient stated his arm is very sore, tender, and refused HD needle access for treatment.  Nephrology notified.  AMA form signed.  Plan to attempt HD treatment in AM on 07/17/16.  Will continue to monitor.

## 2016-07-16 NOTE — Procedures (Signed)
Interventional Radiology Procedure Note  Procedure: Left Radio-basilic fistula (Created [facility] 04/08/16, with revision 04/12/16) access, pharmicomechanical thrombectomy, balloon angioplasty, restoration of flow.  Early thrombosis after 2 weeks (5 dialysis sessions).   Findings: Thrombosed fistula on start, with 64m diameter PTA of venous outflow long-segment stenosis in the distal forearm.   Embolus to radial artery during case, with restoration of flow with IA tPA infusion.  Warm hand on completion with no symptoms.   Thrill upon completion with restoration of flow.  .  Complications: None  Recommendations:  - Ok to use fistula.  If recurrent thrombosis, would refer for tunneled catheter placement and surgical evaluation.  - Do not submerge for 7 days - Routine care   Signed,  JDulcy Fanny WEarleen Newport DO

## 2016-07-16 NOTE — Sedation Documentation (Signed)
Patient is resting comfortably.

## 2016-07-16 NOTE — Progress Notes (Signed)
[facility] Progress Note   Subjective:  "My arm is too sore to be stuck today. I can't handle it, it hurts." On phone with dietary ordering phone.  S/P Declot today in IR. Admitted for clotted access/hyperkalemia, now refusing dialysis.   Objective Vitals:   07/16/16 1147 07/16/16 1154 07/16/16 1201 07/16/16 1206  BP: (!) 196/116 (!) 202/116 (!) 205/113 (!) 188/110  Pulse: 78 78 76 76  Resp: _0 Temp:      TempSrc:      SpO2: 96% 100% 98% 96%  Weight:      Height:       Physical Exam General: Awake, Alert, NAD Heart: I6,O0, 2/6 systolic M Lungs: BBS CTAB Abdomen: soft, non-tender Extremities: Trace LE edema Dialysis Access: LFA AVF + bruit throughout. Drsg from declot CDI.    Additional Objective Labs: Basic Metabolic Panel:  Recent Labs Lab 07/10/16 2038 07/11/16 0817 07/14/16 2039 07/14/16 2315  07/15/16 2206 07/16/16 0156 07/16/16 0611  NA 133* 135 137 133*  --   --   --   --   K 5.4* 4.6 6.0* 6.0*  < > 5.3* 5.5* 4.8  CL 95* 94* 102 98*  --   --   --   --   CO2 24 26  --  20*  --   --   --   --   GLUCOSE 165* 113* 120* 89  --   --   --   --   BUN 56* 29* 68* 71*  --   --   --   --   CREATININE 14.75* 9.72* 14.70* 14.15*  --   --   --   --   CALCIUM 8.3* 8.6*  --  7.6*  --   --   --   --   PHOS 4.6  --   --   --   --   --   --   --   < > = values in this interval not displayed. Liver Function Tests:  Recent Labs Lab 07/10/16 1520 07/10/16 2038  AST 28  --   ALT 11*  --   ALKPHOS 172*  --   BILITOT 1.4*  --   PROT 8.0  --   ALBUMIN 3.6 3.1*   No results for input(s): LIPASE, AMYLASE in the last 168 hours. CBC:  Recent Labs Lab 07/10/16 1520 07/14/16 2029 07/14/16 2039  WBC 8.2 5.6  --   NEUTROABS 7.2  --   --   HGB 11.0* 9.2* 10.2*  HCT 33.3* 28.8* 30.0*  MCV 85.6 86.2  --   PLT 267 186  --    Blood Culture    Component Value Date/Time   SDES NASAL SWAB 08/18/2015 1800   SPECREQUEST NONE 08/18/2015 1800    CULT  08/18/2015 1800    NORMAL NASOPHARYNGEAL FLORA Performed at [facility] 08/21/2015 FINAL 08/18/2015 1800    Cardiac Enzymes: No results for input(s): CKTOTAL, CKMB, CKMBINDEX, TROPONINI in the last 168 hours. CBG:  Recent Labs Lab 07/10/16 1719 07/15/16 0815 07/16/16 0743  GLUCAP 114* 95 142*   Studies/Results: Ir US Guide Vasc Access Left  Result Date: 07/16/2016 INDICATION: 49 year old male with nonfunctioning left upper extremity radial-basilic fistula. The fistula was created April 08, 2016 at [facility]. There was then a revision and bovine patch angioplasty secondary to a hematoma on April 12 2016. The fistula has been used 5 times over the last  2 weeks and has thrombosed today. EXAM: IR THROMBECTOMY AV FISTULA W/THROMBOLYSIS INC/SHUNT/IMG LEFT; IR ULTRASOUND GUIDANCE VASC ACCESS LEFT MEDICATIONS: 6 mg Versed, 200 mcg fentanyl, 3 mg tPA, 50 mg Benadryl, ANESTHESIA/SEDATION: Moderate Sedation Time:  125 The patient was continuously monitored during the procedure by the interventional radiology nurse under my direct supervision. FLUOROSCOPY TIME:  Fluoroscopy Time: 14 minutes 18 seconds (3.1 mGy). COMPLICATIONS: None PROCEDURE: The procedure, risks, benefits, and alternatives were explained to the patient and the patient's family, including bleeding, infection, arterial thrombus, venous thrombus, vessel injury, need for further procedure, need for stenting, contrast reaction, need for catheter placement, cardiopulmonary collapse, death. Questions regarding the procedure were encouraged and answered. The patient understands and consents to the procedure. Ultrasound survey was performed with images stored and sent to PACs. The left upper extremity was prepped and draped in the usual sterile fashion. Ultrasound guidance was used to access the upper extremity fistula with a micropuncture kit. Exchange was made for a 6 [place] short sheath. A Bentson  wire was then a navigate into the venous outflow, with an angled catheter advanced into the venous outflow. Venogram of the left upper extremity demonstrates patency of the venous outflow. Upon withdrawal of the angled catheter, a solution of 2 milligrams of tPA in saline was infused into the outflow portion of the graft. A 5 mm diameter balloon catheter was used to macerate the thrombosed segment. Balloon angioplasty of the stenotic venous segment was performed with 5 mm diameter balloon. Ultrasound guidance was then used access the fistula directed toward the arterial anastomosis. Once the micro sheath was in place, an exchange was made for a 6 [place] short sheath. Bentson wire was placed into the proximal venous outflow and balloon angioplasty of the proximal venous outflow was performed to 5 mm diameter. Glidewire was then navigated across the arterial anastomosis, with an over the wire Fogarty balloon advanced into the artery. The arterial plug was pulled, with passage of the Fogarty balloon twice through the thrombosed anastomosis. Repeat venogram demonstrated no restoration of flow. Repeat balloon angioplasty to 5 mm and than 6 mm of the venous outflow was performed of both the centrally directed an the peripherally directed sheath. A 5 millimeter x 4 centimeter balloon was used to balloon angioplasty the arterial anastomosis. Repeat fistulogram was then performed, demonstrating embolism into the distal radial artery. 2 mg of tPA was then infused at the embolism, with repeat angiogram demonstrating restoration of flow to the hand. Repeat balloon angioplasty of the venous outflow was then again performed through the centrally directed sheath. Restoration of flow was achieved, although there was discontinuous flow through the axillary vein. Balloon angioplasty was performed at this segment of the presumed residual thrombus at this site. Complete fistulagram performed through the central vasculature,  demonstrating restoration of flow through the fistula and the axillary vein. Palpable thrill was present at the completion of the case. The short sheaths were removed after placement of hemostasis sutures. Patient tolerated the procedure well and remained hemodynamically stable throughout. No complications were encountered and no significant blood loss was encountered. IMPRESSION: Status post pharmacologic and mechanical thrombectomy/ thrombolysis of occluded left radio basilic dialysis fistula, with sequential balloon angioplasty of proximal venous outflow stenosis to 5 mm and 6 mm diameter and restoration of flow. Signed, Dulcy Fanny. Earleen Newport, DO [facility] Radiology ACCESS: If there is early re- thrombosis of the fistula, tunneled hemodialysis catheter would be considered the next step as well as referral  for surgical evaluation of alternative circuit. Electronically Signed   By: Corrie Mckusick D.O.   On: 07/16/2016 12:43   Portable Chest 1 View  Result Date: 07/14/2016 CLINICAL DATA:  Pain to forearm dialysis fistula. EXAM: PORTABLE CHEST 1 VIEW COMPARISON:  07/10/2016 FINDINGS: The cardiac silhouette is enlarged. Mediastinal contours appear intact. There is no evidence of focal airspace consolidation, or pleural effusion. Asymmetric lucency of the left lung may be artifactual. Osseous structures are without acute abnormality. Soft tissues are grossly normal. IMPRESSION: No definite evidence of active disease. Enlarged cardiac silhouette. Asymmetric lucency of the left lung may be artifactual. If there is clinical suspicion for left pneumothorax, decubital chest radiograph, right side down, may be considered. Electronically Signed   By: Fidela Salisbury M.D.   On: 07/14/2016 23:50   Ir Thrombectomy Av Fistula W/thrombolysis Inc/shunt/img Left  Result Date: 07/16/2016 INDICATION: 50 year old male with nonfunctioning left upper extremity radial-basilic  fistula. The fistula was created April 08, 2016 at [facility]. There was then a revision and bovine patch angioplasty secondary to a hematoma on April 12 2016. The fistula has been used 5 times over the last 2 weeks and has thrombosed today. EXAM: IR THROMBECTOMY AV FISTULA W/THROMBOLYSIS INC/SHUNT/IMG LEFT; IR ULTRASOUND GUIDANCE VASC ACCESS LEFT MEDICATIONS: 6 mg Versed, 200 mcg fentanyl, 3 mg tPA, 50 mg Benadryl, ANESTHESIA/SEDATION: Moderate Sedation Time:  125 The patient was continuously monitored during the procedure by the interventional radiology nurse under my direct supervision. FLUOROSCOPY TIME:  Fluoroscopy Time: 14 minutes 18 seconds (3.1 mGy). COMPLICATIONS: None PROCEDURE: The procedure, risks, benefits, and alternatives were explained to the patient and the patient's family, including bleeding, infection, arterial thrombus, venous thrombus, vessel injury, need for further procedure, need for stenting, contrast reaction, need for catheter placement, cardiopulmonary collapse, death. Questions regarding the procedure were encouraged and answered. The patient understands and consents to the procedure. Ultrasound survey was performed with images stored and sent to PACs. The left upper extremity was prepped and draped in the usual sterile fashion. Ultrasound guidance was used to access the upper extremity fistula with a micropuncture kit. Exchange was made for a 6 [place] short sheath. A Bentson wire was then a navigate into the venous outflow, with an angled catheter advanced into the venous outflow. Venogram of the left upper extremity demonstrates patency of the venous outflow. Upon withdrawal of the angled catheter, a solution of 2 milligrams of tPA in saline was infused into the outflow portion of the graft. A 5 mm diameter balloon catheter was used to macerate the thrombosed segment. Balloon angioplasty of the stenotic venous segment was performed with 5 mm diameter balloon.  Ultrasound guidance was then used access the fistula directed toward the arterial anastomosis. Once the micro sheath was in place, an exchange was made for a 6 [place] short sheath. Bentson wire was placed into the proximal venous outflow and balloon angioplasty of the proximal venous outflow was performed to 5 mm diameter. Glidewire was then navigated across the arterial anastomosis, with an over the wire Fogarty balloon advanced into the artery. The arterial plug was pulled, with passage of the Fogarty balloon twice through the thrombosed anastomosis. Repeat venogram demonstrated no restoration of flow. Repeat balloon angioplasty to 5 mm and than 6 mm of the venous outflow was performed of both the centrally directed an the peripherally directed sheath. A 5 millimeter x 4 centimeter balloon was used to balloon angioplasty the arterial anastomosis. Repeat fistulogram was then performed, demonstrating embolism into  the distal radial artery. 2 mg of tPA was then infused at the embolism, with repeat angiogram demonstrating restoration of flow to the hand. Repeat balloon angioplasty of the venous outflow was then again performed through the centrally directed sheath. Restoration of flow was achieved, although there was discontinuous flow through the axillary vein. Balloon angioplasty was performed at this segment of the presumed residual thrombus at this site. Complete fistulagram performed through the central vasculature, demonstrating restoration of flow through the fistula and the axillary vein. Palpable thrill was present at the completion of the case. The short sheaths were removed after placement of hemostasis sutures. Patient tolerated the procedure well and remained hemodynamically stable throughout. No complications were encountered and no significant blood loss was encountered. IMPRESSION: Status post pharmacologic and mechanical thrombectomy/ thrombolysis of occluded left radio basilic dialysis fistula, with  sequential balloon angioplasty of proximal venous outflow stenosis to 5 mm and 6 mm diameter and restoration of flow. Signed, Dulcy Fanny. Earleen Newport, DO [facility] Radiology ACCESS: If there is early re- thrombosis of the fistula, tunneled hemodialysis catheter would be considered the next step as well as referral for surgical evaluation of alternative circuit. Electronically Signed   By: Corrie Mckusick D.O.   On: 07/16/2016 12:43   Medications:  . allopurinol  100 mg Oral Daily  . alteplase      . alteplase      . amLODipine  10 mg Oral BID  . atorvastatin  40 mg Oral q1800  . carvedilol  25 mg Oral BID WC  . cinacalcet  30 mg Oral QPM  . cloNIDine  0.3 mg Transdermal Weekly  . diphenhydrAMINE      . doxazosin  2 mg Oral Daily  . famotidine  20 mg Oral Daily  . fentaNYL      . gabapentin  100 mg Oral QHS  . heparin subcutaneous  5,000 Units Subcutaneous Q8H  . hydrALAZINE  100 mg Oral BID  . iopamidol      . isosorbide mononitrate  60 mg Oral Daily  . lidocaine (PF)      . midazolam      . pantoprazole  40 mg Oral Daily  . predniSONE  10 mg Oral Q breakfast  . sevelamer carbonate  1,600 mg Oral TID WC  . sodium chloride flush  3 mL Intravenous Q12H  . sodium chloride flush  3 mL Intravenous Q12H     Dialysis: TTS [facility] was supposed to start 12/23 but AVF was clotted 4h   65kg   2/2.25 bath  Hep 2000    L forearm AVF (R IJ cath dc'd last week)     Assessment: 1. Clotted L arm AVF - this is a new AVF just started using 1-2 wks ago and a mature looking AVF, not sure what the issue is.  Went for declot this AM, now refusing to allow AVF to be stuck for HD.  2. Hyperkalemia - as above. K+ 4.8 3. ESRD now on TTS HD in [place]. Refusing HD today. Stable otherwise. Can be Dc'd home and have HD at dialysis center off schedule tomorrow. Actually had apt with [facility] for declot today but they were unable to reach him by phone to tell him about  scheduled procedure.  4. Hx failed renal Tx 5. NICM - last EF was 35-40% in April 2017 6. Anemia of CKD - Hb 10's, no esa for now here 7. Hx VTE - hist of PE '15  and RUE DVT '16 - two separate incidents, both likely result of long-term indwelling HD cath.  Catheter is now out.  Would not keep on coumadin, would dc.    Rita H. Brown NP-C 07/16/2016, 12:51 PM  [facility] [phone]   Pt seen, examined, agree w assess/plan as above with additions as indicated. Pt's AVF has been treated successfully.  Refusing HD today, we have set him up for OP dialysis tomorrow 12/27 at 12:15 pm.  OK for dc today.   Kelly Splinter MD [employer] pager [phone]    cell [phone] 07/16/2016, 1:35 PM

## 2016-07-16 NOTE — Sedation Documentation (Signed)
R AC IV flushed and checked, slight positional, but otherwise working well.

## 2016-07-17 ENCOUNTER — Encounter (HOSPITAL_COMMUNITY): Payer: Self-pay

## 2016-07-17 DIAGNOSIS — N2581 Secondary hyperparathyroidism of renal origin: Secondary | ICD-10-CM | POA: Diagnosis not present

## 2016-07-17 DIAGNOSIS — Z23 Encounter for immunization: Secondary | ICD-10-CM | POA: Diagnosis not present

## 2016-07-17 DIAGNOSIS — N186 End stage renal disease: Secondary | ICD-10-CM | POA: Diagnosis not present

## 2016-07-18 DIAGNOSIS — Z7901 Long term (current) use of anticoagulants: Secondary | ICD-10-CM | POA: Diagnosis not present

## 2016-07-18 DIAGNOSIS — Z992 Dependence on renal dialysis: Secondary | ICD-10-CM | POA: Diagnosis not present

## 2016-07-18 DIAGNOSIS — T82848A Pain from vascular prosthetic devices, implants and grafts, initial encounter: Secondary | ICD-10-CM | POA: Diagnosis not present

## 2016-07-18 DIAGNOSIS — Z79899 Other long term (current) drug therapy: Secondary | ICD-10-CM | POA: Diagnosis not present

## 2016-07-18 DIAGNOSIS — Z940 Kidney transplant status: Secondary | ICD-10-CM | POA: Diagnosis not present

## 2016-07-18 DIAGNOSIS — Z888 Allergy status to other drugs, medicaments and biological substances status: Secondary | ICD-10-CM | POA: Diagnosis not present

## 2016-07-18 DIAGNOSIS — M79602 Pain in left arm: Secondary | ICD-10-CM | POA: Diagnosis not present

## 2016-07-18 DIAGNOSIS — N2889 Other specified disorders of kidney and ureter: Secondary | ICD-10-CM | POA: Diagnosis not present

## 2016-07-18 DIAGNOSIS — I10 Essential (primary) hypertension: Secondary | ICD-10-CM | POA: Diagnosis not present

## 2016-07-18 DIAGNOSIS — M79601 Pain in right arm: Secondary | ICD-10-CM | POA: Diagnosis not present

## 2016-07-18 DIAGNOSIS — Z7952 Long term (current) use of systemic steroids: Secondary | ICD-10-CM | POA: Diagnosis not present

## 2016-07-19 DIAGNOSIS — N2581 Secondary hyperparathyroidism of renal origin: Secondary | ICD-10-CM | POA: Diagnosis not present

## 2016-07-19 DIAGNOSIS — N186 End stage renal disease: Secondary | ICD-10-CM | POA: Diagnosis not present

## 2016-07-19 DIAGNOSIS — Z23 Encounter for immunization: Secondary | ICD-10-CM | POA: Diagnosis not present

## 2016-07-20 DIAGNOSIS — N186 End stage renal disease: Secondary | ICD-10-CM | POA: Diagnosis not present

## 2016-07-20 DIAGNOSIS — N2581 Secondary hyperparathyroidism of renal origin: Secondary | ICD-10-CM | POA: Diagnosis not present

## 2016-07-20 DIAGNOSIS — Z23 Encounter for immunization: Secondary | ICD-10-CM | POA: Diagnosis not present

## 2016-07-22 DIAGNOSIS — R112 Nausea with vomiting, unspecified: Secondary | ICD-10-CM | POA: Diagnosis not present

## 2016-07-22 DIAGNOSIS — Z79899 Other long term (current) drug therapy: Secondary | ICD-10-CM | POA: Diagnosis not present

## 2016-07-22 DIAGNOSIS — R9431 Abnormal electrocardiogram [ECG] [EKG]: Secondary | ICD-10-CM | POA: Diagnosis not present

## 2016-07-22 DIAGNOSIS — Z7901 Long term (current) use of anticoagulants: Secondary | ICD-10-CM | POA: Diagnosis not present

## 2016-07-22 DIAGNOSIS — Z86718 Personal history of other venous thrombosis and embolism: Secondary | ICD-10-CM | POA: Diagnosis not present

## 2016-07-22 DIAGNOSIS — R197 Diarrhea, unspecified: Secondary | ICD-10-CM | POA: Diagnosis not present

## 2016-07-22 DIAGNOSIS — N186 End stage renal disease: Secondary | ICD-10-CM | POA: Diagnosis not present

## 2016-07-22 DIAGNOSIS — Z992 Dependence on renal dialysis: Secondary | ICD-10-CM | POA: Diagnosis not present

## 2016-07-22 DIAGNOSIS — R1084 Generalized abdominal pain: Secondary | ICD-10-CM | POA: Diagnosis not present

## 2016-07-22 DIAGNOSIS — Z940 Kidney transplant status: Secondary | ICD-10-CM | POA: Diagnosis not present

## 2016-07-22 DIAGNOSIS — I428 Other cardiomyopathies: Secondary | ICD-10-CM | POA: Diagnosis not present

## 2016-07-22 DIAGNOSIS — N2889 Other specified disorders of kidney and ureter: Secondary | ICD-10-CM | POA: Diagnosis not present

## 2016-07-22 DIAGNOSIS — K219 Gastro-esophageal reflux disease without esophagitis: Secondary | ICD-10-CM | POA: Diagnosis not present

## 2016-07-22 DIAGNOSIS — I120 Hypertensive chronic kidney disease with stage 5 chronic kidney disease or end stage renal disease: Secondary | ICD-10-CM | POA: Diagnosis not present

## 2016-07-22 DIAGNOSIS — Z86711 Personal history of pulmonary embolism: Secondary | ICD-10-CM | POA: Diagnosis not present

## 2016-07-22 DIAGNOSIS — Z888 Allergy status to other drugs, medicaments and biological substances status: Secondary | ICD-10-CM | POA: Diagnosis not present

## 2016-07-22 DIAGNOSIS — Z885 Allergy status to narcotic agent status: Secondary | ICD-10-CM | POA: Diagnosis not present

## 2016-07-22 IMAGING — CR DG CHEST 2V
2 series · 2 of 2 positions shown · non-contrast
Comparison: Chest x-ray 02/08/2014.

CLINICAL DATA: Shortness of breath.  Cough.

EXAM:
CHEST  2 VIEW

[w chest pa]
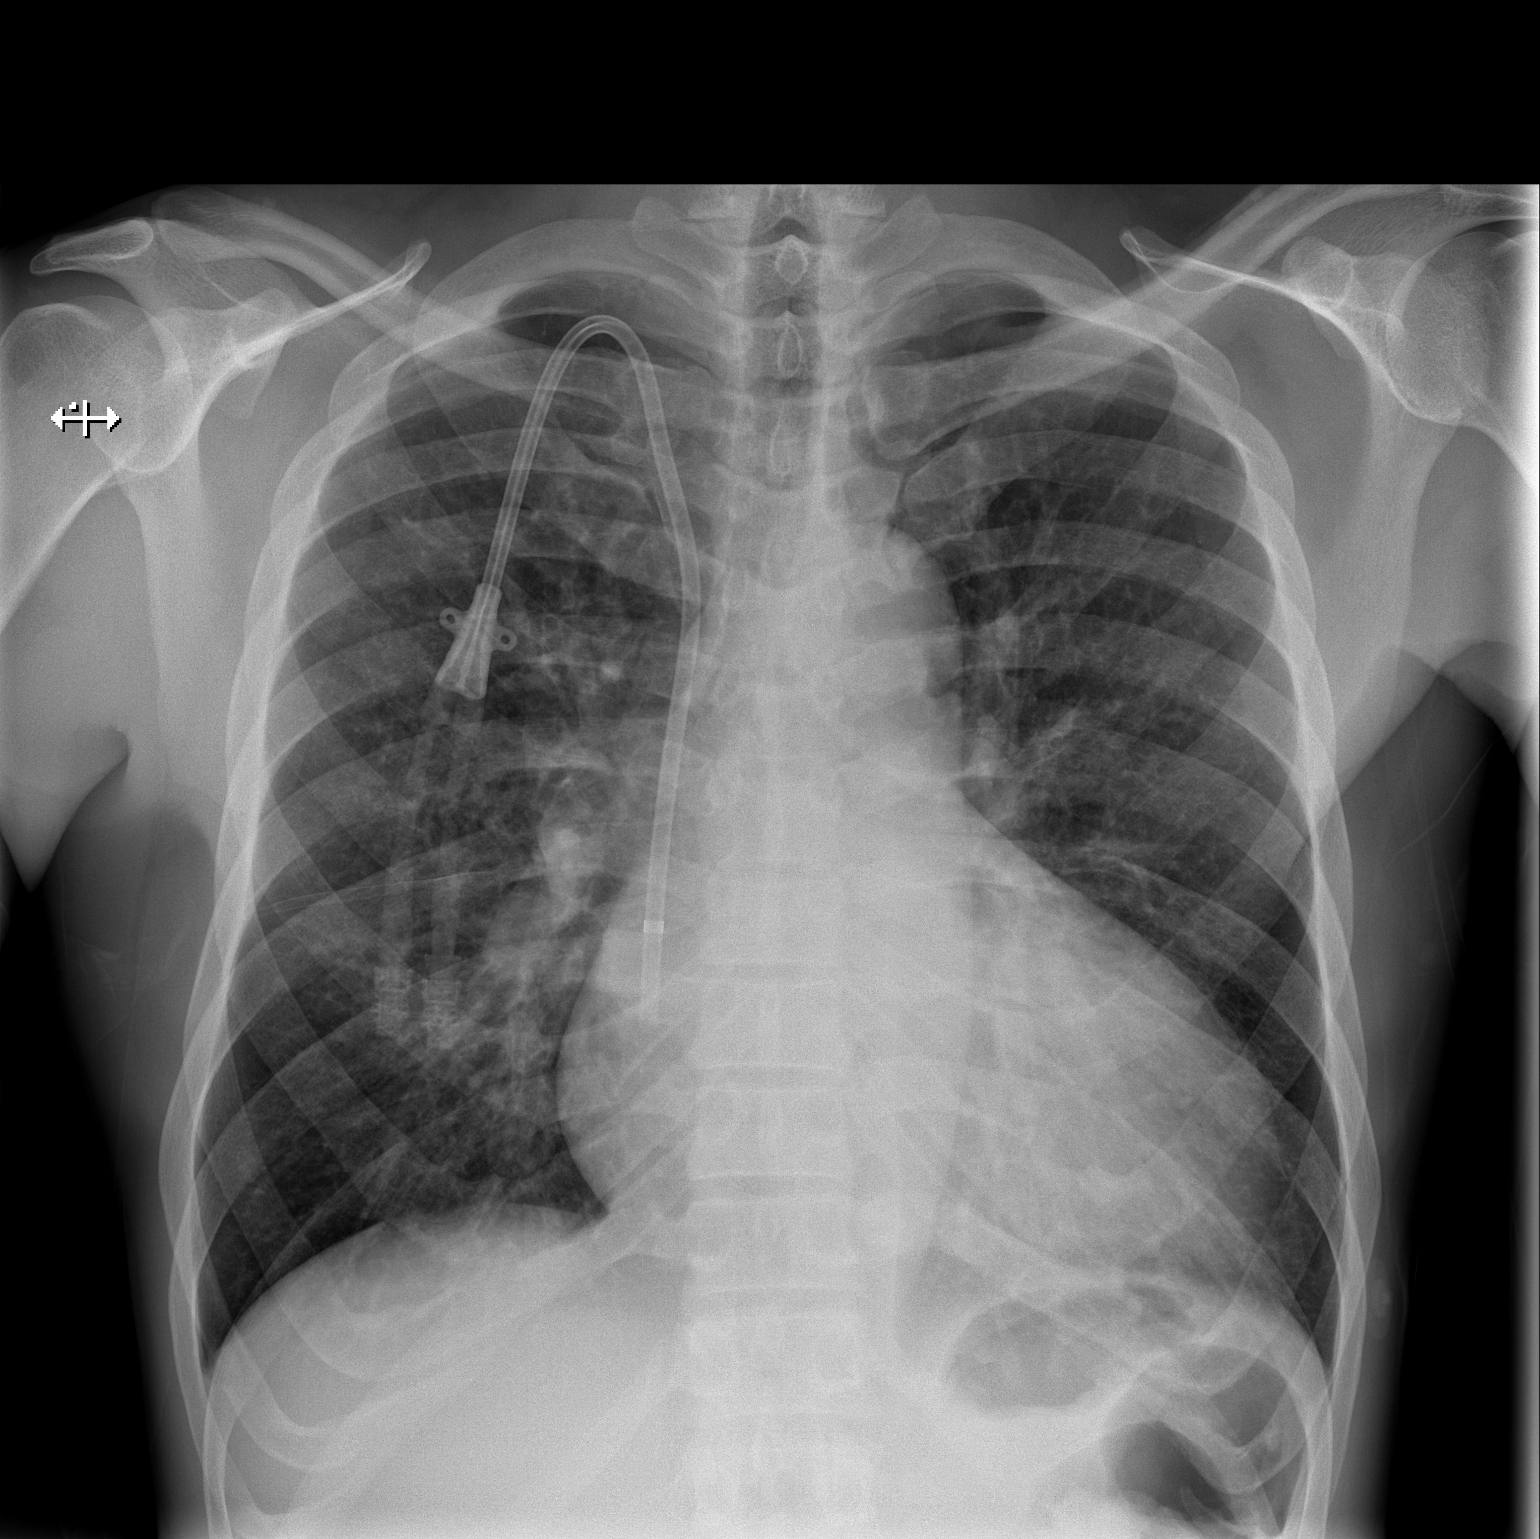

[w chest lat]
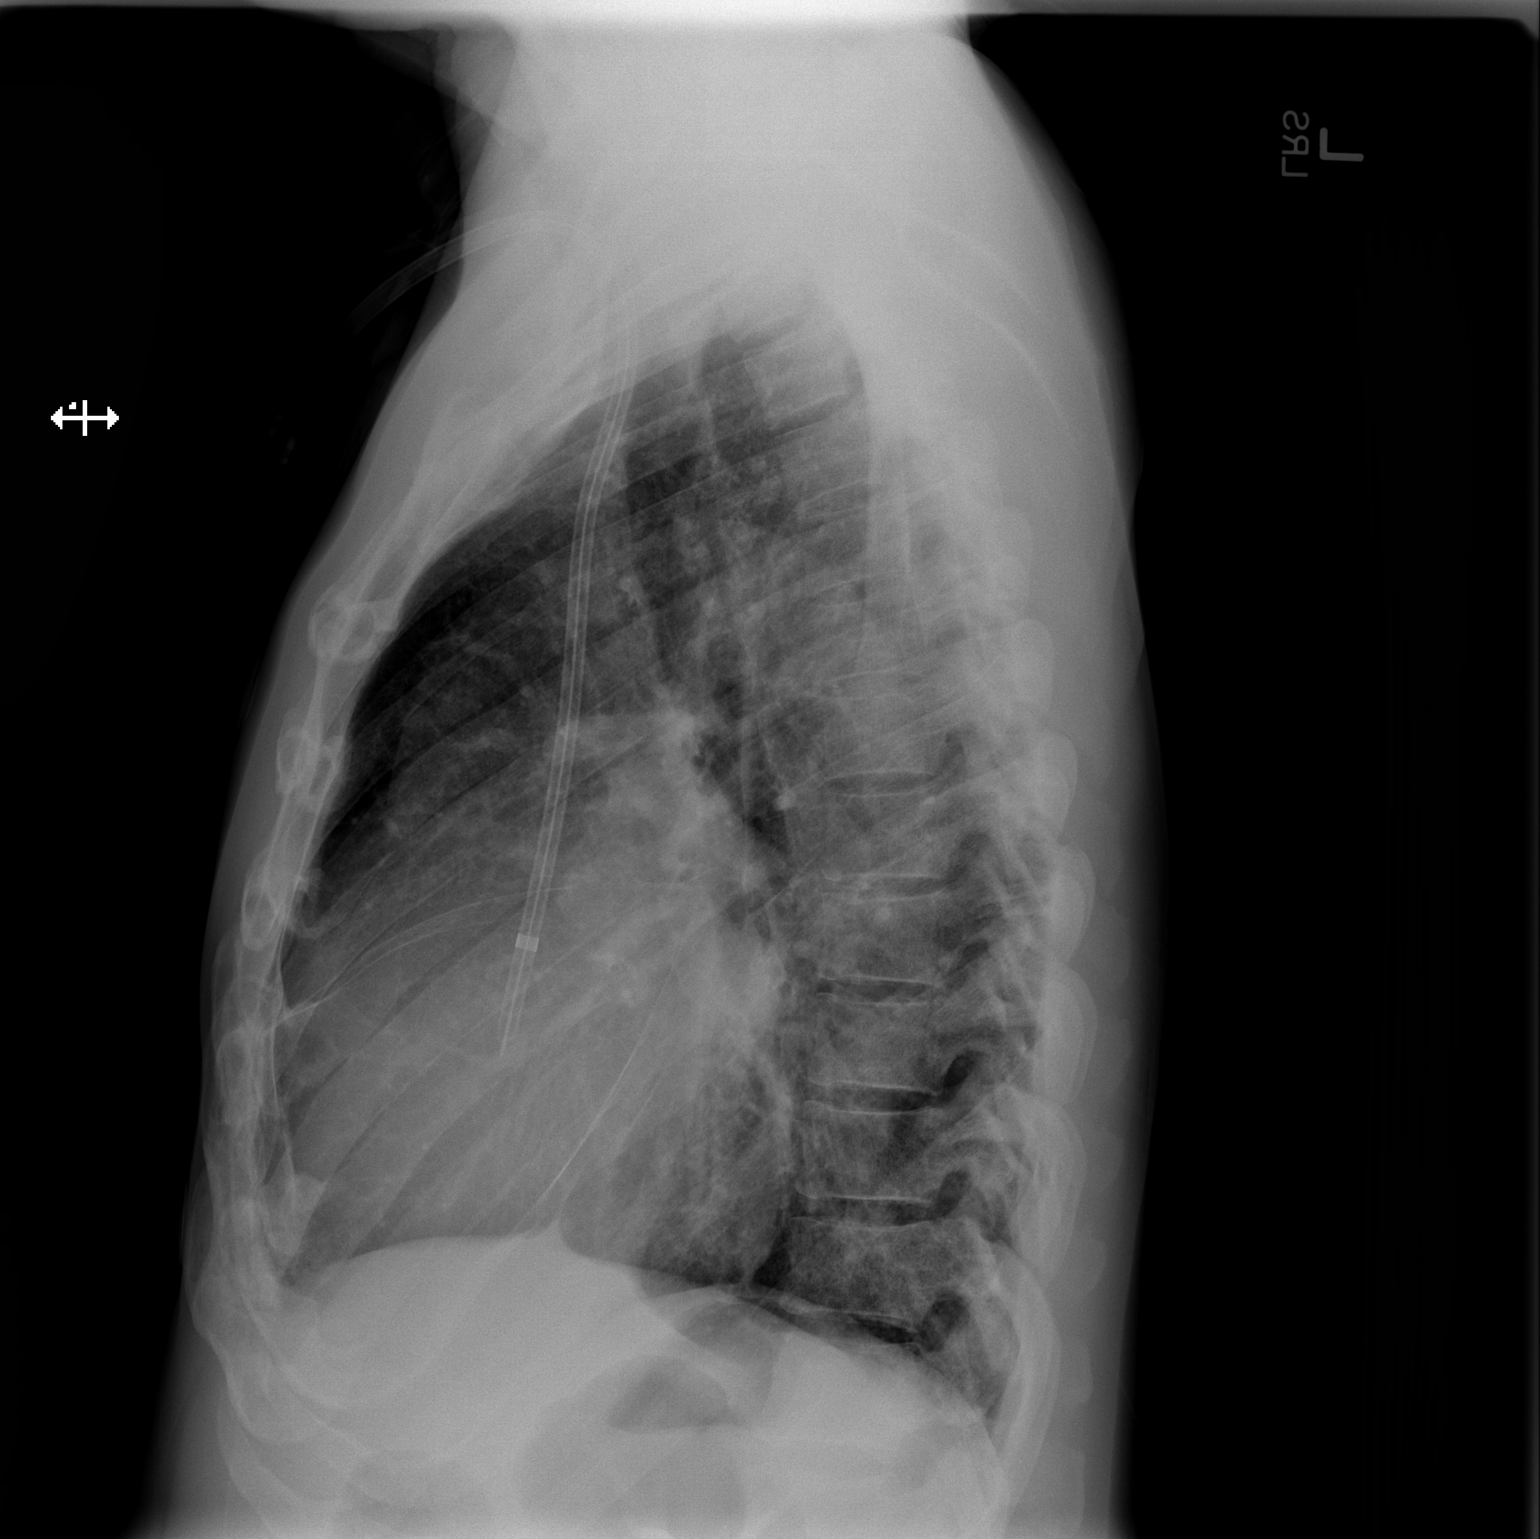

[2 of 2 positions shown; findings below may reference images not displayed]

FINDINGS: Right internal jugular PermCath with tip terminating in the superior
aspect of the right atrium. Mild cardiomegaly. Lung volumes are
normal. No consolidative airspace disease. No pleural effusions. No
pneumothorax. No pulmonary nodule or mass noted. Pulmonary
vasculature is normal. Upper mediastinal contours are within normal
limits.
IMPRESSION: 1. No radiographic evidence of acute cardiopulmonary disease.
2. Mild cardiomegaly.

## 2016-07-23 ENCOUNTER — Ambulatory Visit (INDEPENDENT_AMBULATORY_CARE_PROVIDER_SITE_OTHER): Payer: Medicare Other | Admitting: Urology

## 2016-07-23 DIAGNOSIS — C642 Malignant neoplasm of left kidney, except renal pelvis: Secondary | ICD-10-CM | POA: Diagnosis not present

## 2016-07-23 DIAGNOSIS — N186 End stage renal disease: Secondary | ICD-10-CM | POA: Diagnosis not present

## 2016-07-23 DIAGNOSIS — N2581 Secondary hyperparathyroidism of renal origin: Secondary | ICD-10-CM | POA: Diagnosis not present

## 2016-07-23 DIAGNOSIS — D631 Anemia in chronic kidney disease: Secondary | ICD-10-CM | POA: Diagnosis not present

## 2016-07-24 DIAGNOSIS — D631 Anemia in chronic kidney disease: Secondary | ICD-10-CM | POA: Diagnosis not present

## 2016-07-24 DIAGNOSIS — N186 End stage renal disease: Secondary | ICD-10-CM | POA: Diagnosis not present

## 2016-07-24 DIAGNOSIS — N2581 Secondary hyperparathyroidism of renal origin: Secondary | ICD-10-CM | POA: Diagnosis not present

## 2016-07-25 DIAGNOSIS — N186 End stage renal disease: Secondary | ICD-10-CM | POA: Diagnosis not present

## 2016-07-25 DIAGNOSIS — N2581 Secondary hyperparathyroidism of renal origin: Secondary | ICD-10-CM | POA: Diagnosis not present

## 2016-07-25 DIAGNOSIS — D631 Anemia in chronic kidney disease: Secondary | ICD-10-CM | POA: Diagnosis not present

## 2016-07-26 ENCOUNTER — Other Ambulatory Visit: Payer: Self-pay | Admitting: Urology

## 2016-07-26 DIAGNOSIS — C642 Malignant neoplasm of left kidney, except renal pelvis: Secondary | ICD-10-CM

## 2016-07-28 ENCOUNTER — Emergency Department (HOSPITAL_COMMUNITY)
Admission: EM | Admit: 2016-07-28 | Discharge: 2016-07-29 | Disposition: A | Discharge status: Home / Self Care | Payer: Medicare Other | Attending: Emergency Medicine | Admitting: Emergency Medicine

## 2016-07-28 ENCOUNTER — Encounter (HOSPITAL_COMMUNITY): Payer: Self-pay | Admitting: *Deleted

## 2016-07-28 DIAGNOSIS — N186 End stage renal disease: Secondary | ICD-10-CM | POA: Insufficient documentation

## 2016-07-28 DIAGNOSIS — I5042 Chronic combined systolic (congestive) and diastolic (congestive) heart failure: Secondary | ICD-10-CM | POA: Insufficient documentation

## 2016-07-28 DIAGNOSIS — R1013 Epigastric pain: Secondary | ICD-10-CM

## 2016-07-28 DIAGNOSIS — I132 Hypertensive heart and chronic kidney disease with heart failure and with stage 5 chronic kidney disease, or end stage renal disease: Secondary | ICD-10-CM | POA: Diagnosis not present

## 2016-07-28 DIAGNOSIS — Z79899 Other long term (current) drug therapy: Secondary | ICD-10-CM | POA: Diagnosis not present

## 2016-07-28 DIAGNOSIS — Z992 Dependence on renal dialysis: Secondary | ICD-10-CM | POA: Diagnosis not present

## 2016-07-28 DIAGNOSIS — E1122 Type 2 diabetes mellitus with diabetic chronic kidney disease: Secondary | ICD-10-CM | POA: Insufficient documentation

## 2016-07-28 DIAGNOSIS — Z7901 Long term (current) use of anticoagulants: Secondary | ICD-10-CM | POA: Insufficient documentation

## 2016-07-28 DIAGNOSIS — K5900 Constipation, unspecified: Secondary | ICD-10-CM | POA: Insufficient documentation

## 2016-07-28 DIAGNOSIS — Z87891 Personal history of nicotine dependence: Secondary | ICD-10-CM | POA: Insufficient documentation

## 2016-07-28 DIAGNOSIS — E11649 Type 2 diabetes mellitus with hypoglycemia without coma: Secondary | ICD-10-CM | POA: Diagnosis not present

## 2016-07-28 DIAGNOSIS — R1010 Upper abdominal pain, unspecified: Secondary | ICD-10-CM | POA: Diagnosis present

## 2016-07-28 DIAGNOSIS — E162 Hypoglycemia, unspecified: Secondary | ICD-10-CM | POA: Diagnosis not present

## 2016-07-28 LAB — COMPREHENSIVE METABOLIC PANEL
ALK PHOS: 161 U/L — AB (ref 38–126)
ALT: 10 U/L — AB (ref 17–63)
ANION GAP: 11 (ref 5–15)
AST: 20 U/L (ref 15–41)
Albumin: 3.4 g/dL — ABNORMAL LOW (ref 3.5–5.0)
BUN: 40 mg/dL — ABNORMAL HIGH (ref 6–20)
CALCIUM: 8.5 mg/dL — AB (ref 8.9–10.3)
CO2: 30 mmol/L (ref 22–32)
CREATININE: 13.18 mg/dL — AB (ref 0.61–1.24)
Chloride: 97 mmol/L — ABNORMAL LOW (ref 101–111)
GFR, EST AFRICAN AMERICAN: 4 mL/min — AB (ref >60)
GFR, EST NON AFRICAN AMERICAN: 4 mL/min — AB (ref >60)
Glucose, Bld: 76 mg/dL (ref 65–99)
Potassium: 5.9 mmol/L — ABNORMAL HIGH (ref 3.5–5.1)
SODIUM: 138 mmol/L (ref 135–145)
TOTAL PROTEIN: 7.8 g/dL (ref 6.5–8.1)
Total Bilirubin: 0.6 mg/dL (ref 0.3–1.2)

## 2016-07-28 LAB — CBC
HCT: 28.4 % — ABNORMAL LOW (ref 39.0–52.0)
Hemoglobin: 9.2 g/dL — ABNORMAL LOW (ref 13.0–17.0)
MCH: 29 pg (ref 26.0–34.0)
MCHC: 32.4 g/dL (ref 30.0–36.0)
MCV: 89.6 fL (ref 78.0–100.0)
PLATELETS: 223 10*3/uL (ref 150–400)
RBC: 3.17 MIL/uL — AB (ref 4.22–5.81)
RDW: 15.4 % (ref 11.5–15.5)
WBC: 6.5 10*3/uL (ref 4.0–10.5)

## 2016-07-28 LAB — LIPASE, BLOOD: Lipase: 86 U/L — ABNORMAL HIGH (ref 11–51)

## 2016-07-28 NOTE — ED Triage Notes (Signed)
Pt c/o n/v abd pain that started yesterday,

## 2016-07-28 NOTE — ED Provider Notes (Signed)
[facility] DEPT Provider Note   CSN: [id number] Arrival date & time: 07/28/16  2252  By signing my name below, I, Frank [name], attest that this documentation has been prepared under the direction and in the presence of Frank Porter, MD. Electronically Signed: Reola [name], ED Scribe. 07/29/16. 12:19 AM.  Time seen 12:15 AM  History   Chief Complaint Chief Complaint  Patient presents with  . Abdominal Pain   The history is provided by the patient. No language interpreter was used.    HPI Comments: Frank [name] is a 51 y.o. male with a h/o ESRD on dialysis, who presents to the Emergency Department complaining of upper abdominal pain that began two days ago in the morning. He describes his pain as aching, and his pain radiates into his back. He reports associated nausea and 5-6 episode of emesis secondary to the onset of his abdominal pain. Pt currently follows a Tuesday, Thursday, and Saturday dialysis schedule, with his last full session being two days ago. His pain was present at this session. No noted treatments were tried prior to coming into the ED. His pain is exacerbated with standing upright, and mildly alleviated with hunching forwards. No h/o similar pain or recent sick contacts. No prior surgical history to the abdomen. No recent suspicious food intake. He denies diarrhea, fever, dizziness, or any other associated symptoms.   PCP: None Nephrologist: [place]  Past Medical History:  Diagnosis Date  . Anemia   . Anxiety   . Chronic combined systolic and diastolic CHF (congestive heart failure) (HCC)    a. EF 20-25% by echo in 08/2015 b. echo 10/2015: EF 35-40%, diffuse HK, severe LAE, moderate RAE, small pericardial effusion  . Complication of anesthesia    itching, sore throat  . Depression   . Dialysis patient ([place])   . ESRD (end stage renal disease) ([place])    due to HTN per patient, followed at [facility], s/p failed kidney transplant - dialysis Tue, Th, Sat    . Hyperkalemia 12/2015  . Hypertension   . Junctional rhythm    a. noted in 08/2015: hyperkalemic at that time  b. 12/2015: presented in junctional rhythm w/ K+ of 6.6. Resolved with improvement of K+ levels.  . Nonischemic cardiomyopathy ([place])    a. 08/2014: cath showing minimal CAD, but tortuous arteries noted.   . Personal history of DVT (deep vein thrombosis)/ PE 05/26/2016   In Oct 2015 had small subsemental LUL PE w/o DVT (LE dopplers neg) and was felt to be HD cath related, treated w coumadin.  IN May 2016 had small vein DVT (acute/subacute) in the R basilic/ brachial veins of the RUE, resumed on coumadin.  Had R sided HD cath at that time.    . Renal insufficiency   . Shortness of breath   . Type II diabetes mellitus (HCC)    No history per patient, but remains under history as A1c would not be accurate given on dialysis   Patient Active Problem List   Diagnosis Date Noted  . End-stage renal disease on hemodialysis ([place])   . Subtherapeutic international normalized ratio (INR)   . Hypertension   . Problem with dialysis access ([place]) 06/22/2016  . ESRD (end stage renal disease) on dialysis ([place])   . ESRD (end stage renal disease) ([place]) 06/20/2016  . ESRD needing dialysis ([place]) 06/14/2016  . Uremia 06/07/2016  . Hyperkalemia 05/29/2016  . Chronic back pain 05/29/2016  . GERD (gastroesophageal reflux disease) 05/29/2016  .  Personal history of DVT (deep vein thrombosis)/ PE 05/26/2016  . Bradycardia 03/31/2016  . Nonischemic cardiomyopathy ([place]) 01/09/2016  . Bilateral low back pain without sciatica   . Left renal mass 10/30/2015  . Constipation 10/30/2015  . Hypertensive urgency 09/08/2015  . Adjustment disorder with mixed anxiety and depressed mood 08/20/2015  . Chronic epididymitis 06/19/2015  . Groin pain, chronic, right 06/19/2015  . Incarcerated right inguinal hernia 02/16/2015  . Essential hypertension 01/02/2015  . Dyslipidemia   . ESRD on hemodialysis ([place])   .  Anemia of chronic kidney failure 06/24/2013   Past Surgical History:  Procedure Laterality Date  . CAPD INSERTION    . CAPD REMOVAL    . INGUINAL HERNIA REPAIR Right 02/14/2015   Procedure: REPAIR INCARCERATED RIGHT INGUINAL HERNIA;  Surgeon: [name], MD;  Location: [facility];  Service: General;  Laterality: Right;  . INSERTION OF DIALYSIS CATHETER Right 09/23/2015   Procedure: exchange of Right internal Dialysis Catheter.;  Surgeon: [name], MD;  Location: [facility];  Service: Vascular;  Laterality: Right;  . IR GENERIC HISTORICAL  07/16/2016   IR US GUIDE VASC ACCESS LEFT 07/16/2016 Corrie Mckusick, DO [facility]-INTER[facility]  . IR GENERIC HISTORICAL Left 07/16/2016   IR THROMBECTOMY AV FISTULA W/THROMBOLYSIS/PTA INC/SHUNT/IMG LEFT 07/16/2016 Corrie Mckusick, DO [facility]  . KIDNEY RECEIPIENT  2006   failed and started HD in March 2014  . LEFT HEART CATHETERIZATION WITH CORONARY ANGIOGRAM N/A 09/02/2014   Procedure: LEFT HEART CATHETERIZATION WITH CORONARY ANGIOGRAM;  Surgeon: [name], MD;  Location: [facility] C[facility] LAB;  Service: Cardiovascular;  Laterality: N/A;    Home Medications    Prior to Admission medications   Medication Sig Start Date End Date Taking? Authorizing Provider  allopurinol (ZYLOPRIM) 100 MG tablet Take 100 mg by mouth daily.    Historical Provider, MD  amLODipine (NORVASC) 10 MG tablet Take 10 mg by mouth 2 (two) times daily.     Historical Provider, MD  atorvastatin (LIPITOR) 40 MG tablet Take 1 tablet (40 mg total) by mouth daily at 6 PM. 09/02/14   Barton Dubois, MD  carvedilol (COREG) 25 MG tablet Take 25 mg by mouth 2 (two) times daily with a meal.    Historical Provider, MD  cinacalcet (SENSIPAR) 30 MG tablet Take 30 mg by mouth every evening.     Historical Provider, MD  cloNIDine (CATAPRES - DOSED IN MG/24 HR) 0.3 mg/24hr patch Place 0.3 mg onto the skin once a week.    Historical Provider, MD  doxazosin (CARDURA) 2 MG tablet Take 2 mg by mouth daily.    Historical  Provider, MD  famotidine (PEPCID) 20 MG tablet Take 1 tablet (20 mg total) by mouth 2 (two) times daily. 02/13/16   Charlesetta Shanks, MD  gabapentin (NEURONTIN) 100 MG capsule Take 1 capsule (100 mg total) by mouth at bedtime. Patient taking differently: Take 100 mg by mouth 3 (three) times daily.  05/21/16   [name], DO  hydrALAZINE (APRESOLINE) 100 MG tablet Take 100 mg by mouth 2 (two) times daily.     Historical Provider, MD  isosorbide mononitrate (IMDUR) 60 MG 24 hr tablet Take 1 tablet (60 mg total) by mouth daily. 01/09/16   Smiley Houseman, MD  methocarbamol (ROBAXIN) 500 MG tablet Take 1 tablet (500 mg total) by mouth 2 (two) times daily as needed for muscle spasms. 07/11/16   [name], MD  omeprazole (PRILOSEC) 20 MG capsule Take 20 mg by mouth  daily. 07/01/13   Historical Provider, MD  ondansetron (ZOFRAN ODT) 4 MG disintegrating tablet Take 1 tablet (4 mg total) by mouth every 8 (eight) hours as needed for nausea or vomiting. 05/16/16   Sherwood Gambler, MD  oxycodone (OXY-IR) 5 MG capsule Take 1 capsule (5 mg total) by mouth every 4 (four) hours as needed for pain. 05/30/16   [name], MD  oxyCODONE-acetaminophen (PERCOCET/ROXICET) 5-325 MG tablet Take 1 tablet by mouth every 6 (six) hours as needed for severe pain.    Historical Provider, MD  predniSONE (DELTASONE) 10 MG tablet Take 10 mg by mouth daily with breakfast.    Historical Provider, MD  sevelamer carbonate (RENVELA) 800 MG tablet Take 1,600 mg by mouth 3 (three) times daily with meals.     Historical Provider, MD  warfarin (COUMADIN) 2 MG tablet Take 2 mg by mouth daily. Take with 27m tablet to equal 738m7/12/17   Historical Provider, MD  warfarin (COUMADIN) 5 MG tablet Take 5 mg by mouth See admin instructions. Take 1 tablet (5 mg) by mouth with a 2 mg tablet for a 7 mg dose 01/31/16   Historical Provider, MD   Family History Family History  Problem Relation Age of Onset  . Hypertension Other    Social  History Social History  Substance Use Topics  . Smoking status: Former Smoker    Packs/day: 0.00    Years: 1.00    Types: Cigarettes  . Smokeless tobacco: Never Used     Comment: quit Jan 2014  . Alcohol use No   Allergies   Butalbital-apap-caffeine; Ferrlecit [na ferric gluc cplx in sucrose]; Minoxidil; Tylenol [acetaminophen]; and Darvocet [propoxyphene n-acetaminophen]  Review of Systems Review of Systems  Constitutional: Negative for fever.  Gastrointestinal: Positive for abdominal pain, nausea and vomiting. Negative for diarrhea.  Neurological: Negative for dizziness.  All other systems reviewed and are negative.  Physical Exam Updated Vital Signs BP (!) 170/102 (BP Location: Right Arm)   Pulse 60   Temp 98.1 F (36.7 C) (Oral)   Resp 18   Ht _0  (1.88 m)   Wt 165 lb (74.8 kg)   SpO2 99%   BMI 21.18 kg/m   Vital signs normal except for hypertension   Physical Exam  Constitutional: He is oriented to person, place, and time. He appears well-developed and well-nourished.  Non-toxic appearance. He does not appear ill. He appears distressed.  Laying with a blanket over his head, mumbles when talking  HENT:  Head: Normocephalic and atraumatic.  Right Ear: External ear normal.  Left Ear: External ear normal.  Nose: Nose normal. No mucosal edema or rhinorrhea.  Mouth/Throat: Mucous membranes are normal. No dental abscesses or uvula swelling.  Tongue dry  Eyes: Conjunctivae and EOM are normal. Pupils are equal, round, and reactive to light.  Neck: Normal range of motion and full passive range of motion without pain. Neck supple.  Cardiovascular: Normal rate, regular rhythm and normal heart sounds.  Exam reveals no gallop and no friction rub.   No murmur heard. Pulmonary/Chest: Effort normal and breath sounds normal. No respiratory distress. He has no wheezes. He has no rhonchi. He has no rales. He exhibits no tenderness and no crepitus.  Abdominal: Soft. Normal  appearance and bowel sounds are normal. He exhibits no distension. There is tenderness. There is no rebound and no guarding.    Tender diffusely across the upper abdomen, but localized more in the epigastrium.   Musculoskeletal: Normal range of  motion. He exhibits no edema or tenderness.  Moves all extremities well.  Pt has a fistula in his left upper arm, + bruit and thrill  Neurological: He is alert and oriented to person, place, and time. He has normal strength. No cranial nerve deficit.  Skin: Skin is warm, dry and intact. No rash noted. No erythema. No pallor.  Psychiatric: He has a normal mood and affect. His speech is normal and behavior is normal. His mood appears not anxious.  Nursing note and vitals reviewed.  ED Treatments / Results  DIAGNOSTIC STUDIES: Oxygen Saturation is 99% on RA, normal by my interpretation.  Labs (all labs ordered are listed, but only abnormal results are displayed) Results for orders placed or performed during the hospital encounter of 07/28/16  Lipase, blood  Result Value Ref Range   Lipase 86 (H) 11 - 51 U/L  Comprehensive metabolic panel  Result Value Ref Range   Sodium 138 135 - 145 mmol/L   Potassium 5.9 (H) 3.5 - 5.1 mmol/L   Chloride 97 (L) 101 - 111 mmol/L   CO2 30 22 - 32 mmol/L   Glucose, Bld 76 65 - 99 mg/dL   BUN 40 (H) 6 - 20 mg/dL   Creatinine, Ser 13.18 (H) 0.61 - 1.24 mg/dL   Calcium 8.5 (L) 8.9 - 10.3 mg/dL   Total Protein 7.8 6.5 - 8.1 g/dL   Albumin 3.4 (L) 3.5 - 5.0 g/dL   AST 20 15 - 41 U/L   ALT 10 (L) 17 - 63 U/L   Alkaline Phosphatase 161 (H) 38 - 126 U/L   Total Bilirubin 0.6 0.3 - 1.2 mg/dL   GFR calc non Af Amer 4 (L) >60 mL/min   GFR calc Af Amer 4 (L) >60 mL/min   Anion gap 11 5 - 15  CBC  Result Value Ref Range   WBC 6.5 4.0 - 10.5 K/uL   RBC 3.17 (L) 4.22 - 5.81 MIL/uL   Hemoglobin 9.2 (L) 13.0 - 17.0 g/dL   HCT 28.4 (L) 39.0 - 52.0 %   MCV 89.6 78.0 - 100.0 fL   MCH 29.0 26.0 - 34.0 pg   MCHC 32.4  30.0 - 36.0 g/dL   RDW 15.4 11.5 - 15.5 %   Platelets 223 150 - 400 K/uL  CBG monitoring, ED  Result Value Ref Range   Glucose-Capillary 33 (LL) 65 - 99 mg/dL  CBG monitoring, ED  Result Value Ref Range   Glucose-Capillary 111 (H) 65 - 99 mg/dL   Comment 1 Notify RN    Laboratory interpretation all normal except chronic renal failure, stable anemia, hyperkalemia, elevated lipase   EKG  EKG Interpretation  Date/Time:  Sunday July 28 2016 23:09:09 EST Ventricular Rate:  59 PR Interval:    QRS Duration: 94 QT Interval:  481 QTC Calculation: 477 R Axis:   3 Text Interpretation:  Sinus rhythm Left ventricular hypertrophy Abnormal T, consider ischemia, lateral leads Baseline wander mild peaking of some T waves No significant change since last tracing 14 Jul 2016 Confirmed by Rafan Sanders  MD-I, [place] Greb ([id number]) on 07/29/2016 1:03:15 AM        Radiology Ct Abdomen Pelvis W Contrast  Result Date: 07/29/2016 CLINICAL DATA:  Upper abdominal pain. Dialysis patient with failed renal transplant from 2006. History of diabetes. EXAM: CT ABDOMEN AND PELVIS WITH CONTRAST TECHNIQUE: Multidetector CT imaging of the abdomen and pelvis was performed using the standard protocol following bolus administration of intravenous contrast. CONTRAST:  30m ISOVUE-300  IOPAMIDOL (ISOVUE-300) INJECTION 61%, 17m ISOVUE-300 IOPAMIDOL (ISOVUE-300) INJECTION 61% COMPARISON:  01/01/2016 FINDINGS: Lower chest: No acute abnormality. Hepatobiliary: Mild diffuse fatty infiltration of the liver. No focal lesions. Gallbladder and bile ducts are unremarkable. Pancreas: Unremarkable. No pancreatic ductal dilatation or surrounding inflammatory changes. Spleen: Normal in size without focal abnormality. Adrenals/Urinary Tract: No adrenal gland nodules. Kidneys are atrophic bilaterally. No hydronephrosis. Low-attenuation mass in the left kidney measuring 4.1 cm diameter, unchanged since prior study, measures low-density but contains internal  architectural change. Solid mass is not excluded. Delayed renal nephrograms. No contrast material demonstrated renal collecting systems. Bladder is decompressed. Left pelvic transplant kidney demonstrating prominent vascular calcification. No contrast uptake is identified. Stomach/Bowel: Motion artifact limits evaluation. No evidence of significant bowel distention or wall thickening. Colon is decompressed. Appendix is not identified. Vascular/Lymphatic: Diffuse calcification of abdominal aorta and branch vessels. No aneurysm. Inferior vena cava is unremarkable. No lymphadenopathy. Reproductive: Prostate is unremarkable. Other: No abdominal wall hernia or abnormality. No abdominopelvic ascites. Musculoskeletal: No acute or significant osseous findings. IMPRESSION: No acute process demonstrated in the abdomen or pelvis. Mild fatty infiltration of the liver. Atrophic native kidneys with low-attenuation indeterminate mass lesion in the left kidney, unchanged. Transplanted left pelvic kidney with vascular calcification. No renal function is identified. No evidence of bowel obstruction or inflammation. Aortic atherosclerosis. Electronically Signed   By: WLucienne CapersM.D.   On: 07/29/2016 01:19    Procedures Procedures   Medications Ordered in ED Medications  sodium polystyrene (KAYEXALATE) 15 GM/60ML suspension 45 g (45 g Oral Refused 07/29/16 0404)  iopamidol (ISOVUE-300) 61 % injection 100 mL (100 mLs Intravenous Contrast Given 07/29/16 0053)  fentaNYL (SUBLIMAZE) injection 50 mcg (50 mcg Intravenous Given 07/29/16 0046)  ondansetron (ZOFRAN) injection 4 mg (4 mg Intravenous Given 07/29/16 0045)  iopamidol (ISOVUE-300) 61 % injection (15 mLs Oral Contrast Given 07/29/16 0052)  alum & mag hydroxide-simeth (MAALOX/MYLANTA) 200-200-20 MG/5ML suspension 30 mL (30 mLs Oral Given 07/29/16 0128)  famotidine (PEPCID) IVPB 20 mg premix (0 mg Intravenous Stopped 07/29/16 0216)  fentaNYL (SUBLIMAZE) injection 50 mcg (50 mcg  Intravenous Given 07/29/16 0347)  calcium gluconate 1 g in sodium chloride 0.9 % 100 mL IVPB (0 g Intravenous Stopped 07/29/16 0544)  sodium bicarbonate injection 50 mEq (50 mEq Intravenous Given 07/29/16 0410)  insulin aspart (novoLOG) injection 10 Units (10 Units Intravenous Given 07/29/16 0415)  dextrose 50 % solution 50 mL (50 mLs Intravenous Given 07/29/16 0407)  dextrose 50 % solution 50 mL (50 mLs Intravenous Given 07/29/16 0548)    Initial Impression / Assessment and Plan / ED Course  I have reviewed the triage vital signs and the nursing notes.  Pertinent labs & imaging results that were available during my care of the patient were reviewed by me and considered in my medical decision making (see chart for details).  Clinical Course     COORDINATION OF CARE: 12:19 AM-Discussed next steps with pt. Pt verbalized understanding and is agreeable with the plan.   2 AM after reviewing his CT results he was given Maalox and Pepcid.  Recheck 3 AM patient states he's not any better. His potassium is mildly high however I reviewed his prior ED visits and he basically refuses to take the Kayexalate. He was treated for hyperkalemia possibly that could be the cause of his abdominal pain. However patient refuses to take the Kayexalate.  I reviewed patient's prior charts. See below. I looked at his CT scan he does have a lot of  stool in the lower colon.  When patient was being discharged he was noted to be diaphoretic. His CBG was 33. He was given 1 amp of D50 and was allowed to eat and his CBG improved to 111. Despite having the abdominal pain patient is requesting food to eat such as a [place] sandwich. He was discharged home to take MiraLAX over-the-counter.  [facility] everywhere says he just had a CT of his abdomen and pelvis done January 1 and compared to one he had in June prior to that. They describe a lot of stool in the colon. He is being followed for a left renal mass. Of note here he has an  appointment in 2 days to get abdominal CT ordered by our urologist.  Review of the [facility] shows patient has gotten 13 narcotic prescriptions since July 21. 3 were in October, 2 were in November, and 4 were in December. All 4 amounts 20 tablets or less. However there are multiple prescribers, [facility], hospitalist service, [facility] emergency department, [facility].  Final Clinical Impressions(s) / ED Diagnoses   Final diagnoses:  Epigastric pain  Constipation, unspecified constipation type   New Prescriptions miralax OTC   Plan discharge  Frank Porter, MD, Barbette Or, MD 07/29/16 [phone]

## 2016-07-29 ENCOUNTER — Encounter (HOSPITAL_COMMUNITY): Payer: Self-pay | Admitting: Radiology

## 2016-07-29 ENCOUNTER — Emergency Department (HOSPITAL_COMMUNITY): Payer: Medicare Other

## 2016-07-29 DIAGNOSIS — N2581 Secondary hyperparathyroidism of renal origin: Secondary | ICD-10-CM | POA: Diagnosis not present

## 2016-07-29 DIAGNOSIS — D631 Anemia in chronic kidney disease: Secondary | ICD-10-CM | POA: Diagnosis not present

## 2016-07-29 DIAGNOSIS — R1010 Upper abdominal pain, unspecified: Secondary | ICD-10-CM | POA: Diagnosis not present

## 2016-07-29 DIAGNOSIS — N186 End stage renal disease: Secondary | ICD-10-CM | POA: Diagnosis not present

## 2016-07-29 LAB — CBG MONITORING, ED
GLUCOSE-CAPILLARY: 111 mg/dL — AB (ref 65–99)
GLUCOSE-CAPILLARY: 33 mg/dL — AB (ref 65–99)

## 2016-07-29 MED ORDER — IOPAMIDOL (ISOVUE-300) INJECTION 61%
100.0000 mL | Freq: Once | INTRAVENOUS | Status: AC | PRN
  Administered 2016-07-29: 100 mL via INTRAVENOUS

## 2016-07-29 MED ORDER — SODIUM POLYSTYRENE SULFONATE 15 GM/60ML PO SUSP
45.0000 g | Freq: Once | ORAL | Status: DC
  Filled 2016-07-29: qty 180

## 2016-07-29 MED ORDER — FENTANYL CITRATE (PF) 100 MCG/2ML IJ SOLN
50.0000 ug | Freq: Once | INTRAMUSCULAR | Status: AC
  Administered 2016-07-29: 50 ug via INTRAVENOUS
  Filled 2016-07-29: qty 2

## 2016-07-29 MED ORDER — ONDANSETRON HCL 4 MG/2ML IJ SOLN
4.0000 mg | Freq: Once | INTRAMUSCULAR | Status: AC
  Administered 2016-07-29: 4 mg via INTRAVENOUS
  Filled 2016-07-29: qty 2

## 2016-07-29 MED ORDER — IOPAMIDOL (ISOVUE-300) INJECTION 61%
INTRAVENOUS | Status: AC
  Administered 2016-07-29: 15 mL via ORAL
  Filled 2016-07-29: qty 30

## 2016-07-29 MED ORDER — INSULIN ASPART 100 UNIT/ML ~~LOC~~ SOLN
10.0000 [IU] | Freq: Once | SUBCUTANEOUS | Status: AC
Start: 2016-07-29 — End: 2016-07-29
  Administered 2016-07-29: 10 [IU] via INTRAVENOUS
  Filled 2016-07-29: qty 1

## 2016-07-29 MED ORDER — ALUM & MAG HYDROXIDE-SIMETH 200-200-20 MG/5ML PO SUSP
30.0000 mL | Freq: Once | ORAL | Status: AC
  Administered 2016-07-29: 30 mL via ORAL
  Filled 2016-07-29: qty 30

## 2016-07-29 MED ORDER — DEXTROSE 50 % IV SOLN
1.0000 | Freq: Once | INTRAVENOUS | Status: AC
  Administered 2016-07-29: 50 mL via INTRAVENOUS

## 2016-07-29 MED ORDER — DEXTROSE 50 % IV SOLN
INTRAVENOUS | Status: AC
  Filled 2016-07-29: qty 50

## 2016-07-29 MED ORDER — SODIUM CHLORIDE 0.9 % IV SOLN
1.0000 g | Freq: Once | INTRAVENOUS | Status: AC
  Administered 2016-07-29: 1 g via INTRAVENOUS
  Filled 2016-07-29: qty 10

## 2016-07-29 MED ORDER — SODIUM BICARBONATE 8.4 % IV SOLN
50.0000 meq | Freq: Once | INTRAVENOUS | Status: AC
  Administered 2016-07-29: 50 meq via INTRAVENOUS
  Filled 2016-07-29: qty 50

## 2016-07-29 MED ORDER — FAMOTIDINE IN NACL 20-0.9 MG/50ML-% IV SOLN
20.0000 mg | Freq: Once | INTRAVENOUS | Status: AC
  Administered 2016-07-29: 20 mg via INTRAVENOUS
  Filled 2016-07-29: qty 50

## 2016-07-29 NOTE — ED Notes (Signed)
Went into pt's room, pt had unhooked his own iv and cut the pump off, states "it had been running for hours" explained to pt the importance of using the call bell when the pump beeped, pt states " I have been doing this for years"

## 2016-07-29 NOTE — ED Notes (Signed)
Per Dr Tomi Bamberger pt is able to go home,

## 2016-07-29 NOTE — ED Notes (Signed)
Pt alert, eating peanut butter and crackers with ginger ale,

## 2016-07-29 NOTE — ED Notes (Signed)
Pt c/o being hot, pt diaphoretic, cbg 64, Dr Tomi Bamberger notified, additional orders given,

## 2016-07-29 NOTE — ED Notes (Signed)
Called into pt's room, pt states that the abd pain is worse, refused to have bp rechecked, states "it is too tight on my arm"

## 2016-07-29 NOTE — Discharge Instructions (Signed)
Get miralax and put one dose or 17 g in 8 ounces of water,  take 1 dose every 30 minutes for 2-3 hours or until you  get good results and then once or twice daily to prevent constipation.  ° °

## 2016-07-30 ENCOUNTER — Telehealth: Payer: Self-pay | Admitting: Internal Medicine

## 2016-07-30 NOTE — Telephone Encounter (Signed)
Patient is wanting to be referred to a pain management clinic. Patient would to go to [facility], [place], [place]  Patient has kidney stones, back pain, gout,.   Please follow up.

## 2016-07-31 ENCOUNTER — Telehealth: Payer: Self-pay | Admitting: Internal Medicine

## 2016-07-31 DIAGNOSIS — N2581 Secondary hyperparathyroidism of renal origin: Secondary | ICD-10-CM | POA: Diagnosis not present

## 2016-07-31 DIAGNOSIS — N186 End stage renal disease: Secondary | ICD-10-CM | POA: Diagnosis not present

## 2016-07-31 DIAGNOSIS — D631 Anemia in chronic kidney disease: Secondary | ICD-10-CM | POA: Diagnosis not present

## 2016-07-31 NOTE — Telephone Encounter (Signed)
Do these concerns require a referral to pain management? Two of them should be treated as an acute matter correct?

## 2016-07-31 NOTE — Telephone Encounter (Signed)
Patient called requesting a refill on anxiety meds. Please f/u.

## 2016-08-01 ENCOUNTER — Other Ambulatory Visit: Payer: Self-pay | Admitting: Internal Medicine

## 2016-08-01 DIAGNOSIS — G894 Chronic pain syndrome: Secondary | ICD-10-CM

## 2016-08-01 NOTE — Telephone Encounter (Signed)
Patient verified DOB Patient states he has been in contact with [facility] and the facility has refused to see the patient due to him no showing as a previous patient. Patient also states another a referral to psychiatry is being placed to assist patient is managing his wellbeing to be able to get back to work. Patient had no further questions at this time.

## 2016-08-01 NOTE — Telephone Encounter (Signed)
Yes, but from patient's history, these are all chronic issues. Will refer to pain clinic

## 2016-08-01 NOTE — Telephone Encounter (Signed)
Patient was referred to [facility] on 05/02/16 and was provided the contact information to schedule an appointment. Patient states the appointment was denied because patient no showed after first visit.

## 2016-08-02 DIAGNOSIS — D631 Anemia in chronic kidney disease: Secondary | ICD-10-CM | POA: Diagnosis not present

## 2016-08-02 DIAGNOSIS — N2581 Secondary hyperparathyroidism of renal origin: Secondary | ICD-10-CM | POA: Diagnosis not present

## 2016-08-02 DIAGNOSIS — N186 End stage renal disease: Secondary | ICD-10-CM | POA: Diagnosis not present

## 2016-08-03 ENCOUNTER — Encounter (HOSPITAL_COMMUNITY): Payer: Self-pay | Admitting: Emergency Medicine

## 2016-08-03 DIAGNOSIS — E875 Hyperkalemia: Secondary | ICD-10-CM | POA: Diagnosis present

## 2016-08-03 DIAGNOSIS — Z79899 Other long term (current) drug therapy: Secondary | ICD-10-CM

## 2016-08-03 DIAGNOSIS — I428 Other cardiomyopathies: Secondary | ICD-10-CM | POA: Diagnosis present

## 2016-08-03 DIAGNOSIS — E1122 Type 2 diabetes mellitus with diabetic chronic kidney disease: Secondary | ICD-10-CM | POA: Diagnosis not present

## 2016-08-03 DIAGNOSIS — I132 Hypertensive heart and chronic kidney disease with heart failure and with stage 5 chronic kidney disease, or end stage renal disease: Secondary | ICD-10-CM | POA: Diagnosis not present

## 2016-08-03 DIAGNOSIS — M545 Low back pain: Secondary | ICD-10-CM | POA: Diagnosis present

## 2016-08-03 DIAGNOSIS — D631 Anemia in chronic kidney disease: Secondary | ICD-10-CM | POA: Diagnosis present

## 2016-08-03 DIAGNOSIS — G4733 Obstructive sleep apnea (adult) (pediatric): Secondary | ICD-10-CM | POA: Diagnosis present

## 2016-08-03 DIAGNOSIS — I700 Atherosclerosis of aorta: Secondary | ICD-10-CM | POA: Diagnosis not present

## 2016-08-03 DIAGNOSIS — Z86711 Personal history of pulmonary embolism: Secondary | ICD-10-CM

## 2016-08-03 DIAGNOSIS — F17210 Nicotine dependence, cigarettes, uncomplicated: Secondary | ICD-10-CM | POA: Diagnosis present

## 2016-08-03 DIAGNOSIS — E785 Hyperlipidemia, unspecified: Secondary | ICD-10-CM | POA: Diagnosis present

## 2016-08-03 DIAGNOSIS — Z7952 Long term (current) use of systemic steroids: Secondary | ICD-10-CM

## 2016-08-03 DIAGNOSIS — K5900 Constipation, unspecified: Secondary | ICD-10-CM | POA: Diagnosis present

## 2016-08-03 DIAGNOSIS — R262 Difficulty in walking, not elsewhere classified: Secondary | ICD-10-CM | POA: Diagnosis not present

## 2016-08-03 DIAGNOSIS — Z9114 Patient's other noncompliance with medication regimen: Secondary | ICD-10-CM

## 2016-08-03 DIAGNOSIS — I5042 Chronic combined systolic (congestive) and diastolic (congestive) heart failure: Secondary | ICD-10-CM | POA: Diagnosis present

## 2016-08-03 DIAGNOSIS — I7772 Dissection of iliac artery: Secondary | ICD-10-CM | POA: Diagnosis not present

## 2016-08-03 DIAGNOSIS — N2581 Secondary hyperparathyroidism of renal origin: Secondary | ICD-10-CM | POA: Diagnosis present

## 2016-08-03 DIAGNOSIS — Z86718 Personal history of other venous thrombosis and embolism: Secondary | ICD-10-CM

## 2016-08-03 DIAGNOSIS — Z7901 Long term (current) use of anticoagulants: Secondary | ICD-10-CM

## 2016-08-03 DIAGNOSIS — N186 End stage renal disease: Secondary | ICD-10-CM | POA: Diagnosis not present

## 2016-08-03 DIAGNOSIS — F329 Major depressive disorder, single episode, unspecified: Secondary | ICD-10-CM | POA: Diagnosis present

## 2016-08-03 DIAGNOSIS — I120 Hypertensive chronic kidney disease with stage 5 chronic kidney disease or end stage renal disease: Secondary | ICD-10-CM | POA: Diagnosis not present

## 2016-08-03 DIAGNOSIS — G8929 Other chronic pain: Secondary | ICD-10-CM | POA: Diagnosis present

## 2016-08-03 DIAGNOSIS — I7776 Dissection of artery of upper extremity: Secondary | ICD-10-CM | POA: Diagnosis not present

## 2016-08-03 DIAGNOSIS — Z940 Kidney transplant status: Secondary | ICD-10-CM

## 2016-08-03 DIAGNOSIS — Z992 Dependence on renal dialysis: Secondary | ICD-10-CM

## 2016-08-03 DIAGNOSIS — I161 Hypertensive emergency: Secondary | ICD-10-CM | POA: Diagnosis present

## 2016-08-03 DIAGNOSIS — F419 Anxiety disorder, unspecified: Secondary | ICD-10-CM | POA: Diagnosis present

## 2016-08-03 DIAGNOSIS — R451 Restlessness and agitation: Secondary | ICD-10-CM | POA: Diagnosis not present

## 2016-08-03 DIAGNOSIS — R112 Nausea with vomiting, unspecified: Secondary | ICD-10-CM | POA: Diagnosis not present

## 2016-08-03 NOTE — ED Triage Notes (Signed)
Patient with abdominal and back pain that started earlier today.  Patient with nausea and vomiting.  Patient is a dialysis patient, last dialysis was yesterday and due on Monday.

## 2016-08-04 ENCOUNTER — Emergency Department (HOSPITAL_COMMUNITY): Payer: Medicare Other

## 2016-08-04 ENCOUNTER — Inpatient Hospital Stay (HOSPITAL_COMMUNITY)
Admission: EM | Admit: 2016-08-04 | Discharge: 2016-08-08 | DRG: 299 | Disposition: A | Discharge status: Home / Self Care | Payer: Medicare Other | Attending: Family Medicine | Admitting: Family Medicine

## 2016-08-04 DIAGNOSIS — I152 Hypertension secondary to endocrine disorders: Secondary | ICD-10-CM | POA: Diagnosis present

## 2016-08-04 DIAGNOSIS — R1013 Epigastric pain: Secondary | ICD-10-CM | POA: Diagnosis not present

## 2016-08-04 DIAGNOSIS — R1084 Generalized abdominal pain: Secondary | ICD-10-CM

## 2016-08-04 DIAGNOSIS — I428 Other cardiomyopathies: Secondary | ICD-10-CM

## 2016-08-04 DIAGNOSIS — I160 Hypertensive urgency: Secondary | ICD-10-CM | POA: Diagnosis not present

## 2016-08-04 DIAGNOSIS — N189 Chronic kidney disease, unspecified: Secondary | ICD-10-CM

## 2016-08-04 DIAGNOSIS — N186 End stage renal disease: Secondary | ICD-10-CM | POA: Diagnosis not present

## 2016-08-04 DIAGNOSIS — I10 Essential (primary) hypertension: Secondary | ICD-10-CM | POA: Diagnosis not present

## 2016-08-04 DIAGNOSIS — Z992 Dependence on renal dialysis: Secondary | ICD-10-CM

## 2016-08-04 DIAGNOSIS — E785 Hyperlipidemia, unspecified: Secondary | ICD-10-CM | POA: Diagnosis present

## 2016-08-04 DIAGNOSIS — I7776 Dissection of artery of upper extremity: Secondary | ICD-10-CM

## 2016-08-04 DIAGNOSIS — R262 Difficulty in walking, not elsewhere classified: Secondary | ICD-10-CM

## 2016-08-04 DIAGNOSIS — E1159 Type 2 diabetes mellitus with other circulatory complications: Secondary | ICD-10-CM | POA: Diagnosis present

## 2016-08-04 DIAGNOSIS — I169 Hypertensive crisis, unspecified: Secondary | ICD-10-CM | POA: Diagnosis not present

## 2016-08-04 DIAGNOSIS — I700 Atherosclerosis of aorta: Secondary | ICD-10-CM | POA: Diagnosis not present

## 2016-08-04 DIAGNOSIS — I7779 Dissection of other artery: Secondary | ICD-10-CM

## 2016-08-04 DIAGNOSIS — I161 Hypertensive emergency: Secondary | ICD-10-CM | POA: Diagnosis not present

## 2016-08-04 DIAGNOSIS — D631 Anemia in chronic kidney disease: Secondary | ICD-10-CM | POA: Diagnosis not present

## 2016-08-04 DIAGNOSIS — K5901 Slow transit constipation: Secondary | ICD-10-CM | POA: Diagnosis present

## 2016-08-04 HISTORY — DX: Epigastric pain: R10.13

## 2016-08-04 LAB — CBC WITH DIFFERENTIAL/PLATELET
BASOS ABS: 0 10*3/uL (ref 0.0–0.1)
BASOS PCT: 0 %
Eosinophils Absolute: 0.3 10*3/uL (ref 0.0–0.7)
Eosinophils Relative: 6 %
HEMATOCRIT: 26.9 % — AB (ref 39.0–52.0)
Hemoglobin: 8.5 g/dL — ABNORMAL LOW (ref 13.0–17.0)
Lymphocytes Relative: 25 %
Lymphs Abs: 1.4 10*3/uL (ref 0.7–4.0)
MCH: 27.6 pg (ref 26.0–34.0)
MCHC: 31.6 g/dL (ref 30.0–36.0)
MCV: 87.3 fL (ref 78.0–100.0)
MONO ABS: 0.5 10*3/uL (ref 0.1–1.0)
Monocytes Relative: 10 %
NEUTROS ABS: 3.3 10*3/uL (ref 1.7–7.7)
NEUTROS PCT: 59 %
Platelets: 214 10*3/uL (ref 150–400)
RBC: 3.08 MIL/uL — AB (ref 4.22–5.81)
RDW: 15.8 % — ABNORMAL HIGH (ref 11.5–15.5)
WBC: 5.5 10*3/uL (ref 4.0–10.5)

## 2016-08-04 LAB — COMPREHENSIVE METABOLIC PANEL
ALBUMIN: 3.1 g/dL — AB (ref 3.5–5.0)
ALT: 10 U/L — AB (ref 17–63)
AST: 20 U/L (ref 15–41)
Alkaline Phosphatase: 155 U/L — ABNORMAL HIGH (ref 38–126)
Anion gap: 14 (ref 5–15)
BILIRUBIN TOTAL: 0.4 mg/dL (ref 0.3–1.2)
BUN: 26 mg/dL — AB (ref 6–20)
CHLORIDE: 99 mmol/L — AB (ref 101–111)
CO2: 28 mmol/L (ref 22–32)
Calcium: 8.2 mg/dL — ABNORMAL LOW (ref 8.9–10.3)
Creatinine, Ser: 9.65 mg/dL — ABNORMAL HIGH (ref 0.61–1.24)
GFR calc Af Amer: 6 mL/min — ABNORMAL LOW (ref >60)
GFR calc non Af Amer: 5 mL/min — ABNORMAL LOW (ref >60)
GLUCOSE: 67 mg/dL (ref 65–99)
POTASSIUM: 5.1 mmol/L (ref 3.5–5.1)
Sodium: 141 mmol/L (ref 135–145)
TOTAL PROTEIN: 7.3 g/dL (ref 6.5–8.1)

## 2016-08-04 LAB — GLUCOSE, CAPILLARY
GLUCOSE-CAPILLARY: 88 mg/dL (ref 65–99)
Glucose-Capillary: 73 mg/dL (ref 65–99)

## 2016-08-04 LAB — MRSA PCR SCREENING: MRSA BY PCR: POSITIVE — AB

## 2016-08-04 LAB — LIPASE, BLOOD: Lipase: 83 U/L — ABNORMAL HIGH (ref 11–51)

## 2016-08-04 LAB — PROTIME-INR
INR: 1.09
Prothrombin Time: 14.1 seconds (ref 11.4–15.2)

## 2016-08-04 MED ORDER — HYDRALAZINE HCL 50 MG PO TABS: 100.0000 mg | ORAL_TABLET | Freq: Two times a day (BID) | ORAL | Status: DC

## 2016-08-04 MED ORDER — BISACODYL 10 MG RE SUPP: 10.0000 mg | Freq: Every day | RECTAL | Status: DC | PRN

## 2016-08-04 MED ORDER — PANTOPRAZOLE SODIUM 40 MG PO TBEC
40.0000 mg | DELAYED_RELEASE_TABLET | Freq: Every day | ORAL | Status: DC
  Administered 2016-08-04 – 2016-08-08 (×5): 40 mg via ORAL
  Filled 2016-08-04 (×5): qty 1

## 2016-08-04 MED ORDER — SODIUM CHLORIDE 0.9% FLUSH
3.0000 mL | Freq: Two times a day (BID) | INTRAVENOUS | Status: DC
  Administered 2016-08-04 – 2016-08-05 (×2): 3 mL via INTRAVENOUS
  Administered 2016-08-05: 9 mL via INTRAVENOUS
  Administered 2016-08-06 – 2016-08-08 (×4): 3 mL via INTRAVENOUS

## 2016-08-04 MED ORDER — SODIUM CHLORIDE 0.9 % IV SOLN: 100.0000 mL | INTRAVENOUS | Status: DC | PRN

## 2016-08-04 MED ORDER — CHLORHEXIDINE GLUCONATE CLOTH 2 % EX PADS
6.0000 | MEDICATED_PAD | Freq: Every day | CUTANEOUS | Status: DC
  Administered 2016-08-06: 6 via TOPICAL

## 2016-08-04 MED ORDER — ALLOPURINOL 100 MG PO TABS
100.0000 mg | ORAL_TABLET | Freq: Every day | ORAL | Status: DC
  Administered 2016-08-05 – 2016-08-08 (×4): 100 mg via ORAL
  Filled 2016-08-04 (×4): qty 1

## 2016-08-04 MED ORDER — KETOROLAC TROMETHAMINE 15 MG/ML IJ SOLN: 15.0000 mg | Freq: Four times a day (QID) | INTRAMUSCULAR | Status: DC | PRN

## 2016-08-04 MED ORDER — HEPARIN SODIUM (PORCINE) 1000 UNIT/ML DIALYSIS
2000.0000 [IU] | Freq: Once | INTRAMUSCULAR | Status: DC
Start: 2016-08-05 — End: 2016-08-08

## 2016-08-04 MED ORDER — AMLODIPINE BESYLATE 5 MG PO TABS
10.0000 mg | ORAL_TABLET | Freq: Once | ORAL | Status: AC
Start: 2016-08-04 — End: 2016-08-04
  Administered 2016-08-04: 10 mg via ORAL
  Filled 2016-08-04: qty 2

## 2016-08-04 MED ORDER — ONDANSETRON HCL 4 MG/2ML IJ SOLN
4.0000 mg | Freq: Once | INTRAMUSCULAR | Status: AC
Start: 2016-08-04 — End: 2016-08-04
  Administered 2016-08-04: 4 mg via INTRAVENOUS
  Filled 2016-08-04: qty 2

## 2016-08-04 MED ORDER — MORPHINE SULFATE (PF) 4 MG/ML IV SOLN
4.0000 mg | Freq: Once | INTRAVENOUS | Status: AC
  Administered 2016-08-04: 4 mg via INTRAVENOUS
  Filled 2016-08-04: qty 1

## 2016-08-04 MED ORDER — GABAPENTIN 100 MG PO CAPS
100.0000 mg | ORAL_CAPSULE | Freq: Three times a day (TID) | ORAL | Status: DC
  Administered 2016-08-04 – 2016-08-08 (×11): 100 mg via ORAL
  Filled 2016-08-04 (×11): qty 1

## 2016-08-04 MED ORDER — LIDOCAINE HCL (PF) 1 % IJ SOLN: 5.0000 mL | INTRAMUSCULAR | Status: DC | PRN

## 2016-08-04 MED ORDER — HEPARIN SODIUM (PORCINE) 5000 UNIT/ML IJ SOLN
5000.0000 [IU] | Freq: Three times a day (TID) | INTRAMUSCULAR | Status: DC
  Administered 2016-08-05 – 2016-08-07 (×6): 5000 [IU] via SUBCUTANEOUS
  Filled 2016-08-04 (×5): qty 1

## 2016-08-04 MED ORDER — ALTEPLASE 2 MG IJ SOLR: 2.0000 mg | Freq: Once | INTRAMUSCULAR | Status: DC | PRN

## 2016-08-04 MED ORDER — METOCLOPRAMIDE HCL 5 MG/ML IJ SOLN
10.0000 mg | Freq: Three times a day (TID) | INTRAMUSCULAR | Status: DC | PRN
  Administered 2016-08-05: 10 mg via INTRAVENOUS
  Filled 2016-08-04: qty 2

## 2016-08-04 MED ORDER — HYDROMORPHONE HCL 1 MG/ML IJ SOLN
1.0000 mg | INTRAMUSCULAR | Status: DC | PRN
  Administered 2016-08-04 – 2016-08-06 (×9): 2 mg via INTRAVENOUS
  Filled 2016-08-04 (×9): qty 2

## 2016-08-04 MED ORDER — INSULIN ASPART 100 UNIT/ML ~~LOC~~ SOLN
0.0000 [IU] | Freq: Three times a day (TID) | SUBCUTANEOUS | Status: DC
  Administered 2016-08-07 (×3): 1 [IU] via SUBCUTANEOUS

## 2016-08-04 MED ORDER — FAMOTIDINE 20 MG PO TABS
20.0000 mg | ORAL_TABLET | Freq: Two times a day (BID) | ORAL | Status: DC
  Administered 2016-08-04: 20 mg via ORAL
  Filled 2016-08-04: qty 1

## 2016-08-04 MED ORDER — OXYCODONE HCL 5 MG PO TABS
5.0000 mg | ORAL_TABLET | ORAL | Status: DC | PRN
  Administered 2016-08-04 – 2016-08-08 (×9): 5 mg via ORAL
  Filled 2016-08-04 (×9): qty 1

## 2016-08-04 MED ORDER — PENTAFLUOROPROP-TETRAFLUOROETH EX AERO: 1.0000 "application " | INHALATION_SPRAY | CUTANEOUS | Status: DC | PRN

## 2016-08-04 MED ORDER — HYDRALAZINE HCL 25 MG PO TABS
100.0000 mg | ORAL_TABLET | Freq: Once | ORAL | Status: AC
  Administered 2016-08-04: 100 mg via ORAL
  Filled 2016-08-04: qty 4

## 2016-08-04 MED ORDER — MAGNESIUM CITRATE PO SOLN: 1.0000 | Freq: Once | ORAL | Status: DC | PRN

## 2016-08-04 MED ORDER — AMLODIPINE BESYLATE 5 MG PO TABS: 10.0000 mg | ORAL_TABLET | Freq: Two times a day (BID) | ORAL | Status: DC

## 2016-08-04 MED ORDER — CLONIDINE HCL 0.3 MG/24HR TD PTWK
0.3000 mg | MEDICATED_PATCH | TRANSDERMAL | Status: DC
  Administered 2016-08-05: 0.3 mg via TRANSDERMAL
  Filled 2016-08-04: qty 1

## 2016-08-04 MED ORDER — MUPIROCIN 2 % EX OINT
1.0000 "application " | TOPICAL_OINTMENT | Freq: Two times a day (BID) | CUTANEOUS | Status: DC
  Administered 2016-08-04 – 2016-08-07 (×6): 1 via NASAL
  Filled 2016-08-04 (×4): qty 22

## 2016-08-04 MED ORDER — CINACALCET HCL 30 MG PO TABS
30.0000 mg | ORAL_TABLET | Freq: Every evening | ORAL | Status: DC
  Administered 2016-08-06 – 2016-08-07 (×2): 30 mg via ORAL
  Filled 2016-08-04 (×2): qty 1

## 2016-08-04 MED ORDER — HYDRALAZINE HCL 20 MG/ML IJ SOLN
5.0000 mg | Freq: Four times a day (QID) | INTRAMUSCULAR | Status: DC | PRN
  Administered 2016-08-04: 5 mg via INTRAVENOUS
  Filled 2016-08-04: qty 1

## 2016-08-04 MED ORDER — AMLODIPINE BESYLATE 10 MG PO TABS
10.0000 mg | ORAL_TABLET | Freq: Every day | ORAL | Status: DC
  Administered 2016-08-05 – 2016-08-06 (×2): 10 mg via ORAL
  Filled 2016-08-04 (×2): qty 1

## 2016-08-04 MED ORDER — HYDRALAZINE HCL 50 MG PO TABS
100.0000 mg | ORAL_TABLET | Freq: Two times a day (BID) | ORAL | Status: DC
  Administered 2016-08-04 – 2016-08-06 (×4): 100 mg via ORAL
  Administered 2016-08-07: 50 mg via ORAL
  Administered 2016-08-08: 100 mg via ORAL
  Filled 2016-08-04 (×8): qty 2

## 2016-08-04 MED ORDER — SENNOSIDES-DOCUSATE SODIUM 8.6-50 MG PO TABS
1.0000 | ORAL_TABLET | Freq: Every evening | ORAL | Status: DC | PRN
  Filled 2016-08-04: qty 1

## 2016-08-04 MED ORDER — TRAZODONE HCL 50 MG PO TABS
25.0000 mg | ORAL_TABLET | Freq: Every evening | ORAL | Status: DC | PRN
  Administered 2016-08-07: 25 mg via ORAL
  Filled 2016-08-04: qty 1

## 2016-08-04 MED ORDER — ONDANSETRON HCL 4 MG/2ML IJ SOLN
4.0000 mg | Freq: Once | INTRAMUSCULAR | Status: AC
  Administered 2016-08-04: 4 mg via INTRAVENOUS
  Filled 2016-08-04: qty 2

## 2016-08-04 MED ORDER — CARVEDILOL 25 MG PO TABS
25.0000 mg | ORAL_TABLET | Freq: Two times a day (BID) | ORAL | Status: DC
  Administered 2016-08-04 – 2016-08-08 (×6): 25 mg via ORAL
  Filled 2016-08-04: qty 1
  Filled 2016-08-04: qty 2
  Filled 2016-08-04 (×6): qty 1

## 2016-08-04 MED ORDER — LIDOCAINE-PRILOCAINE 2.5-2.5 % EX CREA
1.0000 "application " | TOPICAL_CREAM | CUTANEOUS | Status: DC | PRN
  Filled 2016-08-04: qty 5

## 2016-08-04 MED ORDER — ISOSORBIDE MONONITRATE ER 60 MG PO TB24
60.0000 mg | ORAL_TABLET | Freq: Every day | ORAL | Status: DC
  Administered 2016-08-04 – 2016-08-08 (×5): 60 mg via ORAL
  Filled 2016-08-04 (×3): qty 2
  Filled 2016-08-04 (×3): qty 1

## 2016-08-04 MED ORDER — METOCLOPRAMIDE HCL 5 MG/ML IJ SOLN: 10.0000 mg | Freq: Three times a day (TID) | INTRAMUSCULAR | Status: DC

## 2016-08-04 MED ORDER — SODIUM CHLORIDE 0.9 % IV SOLN: INTRAVENOUS | Status: DC

## 2016-08-04 MED ORDER — ONDANSETRON 4 MG PO TBDP
4.0000 mg | ORAL_TABLET | Freq: Three times a day (TID) | ORAL | Status: DC | PRN
  Administered 2016-08-04: 4 mg via ORAL
  Filled 2016-08-04 (×2): qty 1

## 2016-08-04 MED ORDER — ATORVASTATIN CALCIUM 40 MG PO TABS
40.0000 mg | ORAL_TABLET | Freq: Every day | ORAL | Status: DC
  Administered 2016-08-06 – 2016-08-07 (×2): 40 mg via ORAL
  Filled 2016-08-04 (×2): qty 1

## 2016-08-04 MED ORDER — DOXAZOSIN MESYLATE 2 MG PO TABS
2.0000 mg | ORAL_TABLET | Freq: Every day | ORAL | Status: DC
  Administered 2016-08-05 – 2016-08-08 (×4): 2 mg via ORAL
  Filled 2016-08-04 (×5): qty 1

## 2016-08-04 MED ORDER — HEPARIN SODIUM (PORCINE) 1000 UNIT/ML DIALYSIS: 1000.0000 [IU] | INTRAMUSCULAR | Status: DC | PRN

## 2016-08-04 MED ORDER — ESMOLOL HCL-SODIUM CHLORIDE 2000 MG/100ML IV SOLN
25.0000 ug/kg/min | INTRAVENOUS | Status: DC
  Administered 2016-08-04: 500 ug/kg/min via INTRAVENOUS
  Administered 2016-08-05: 50 ug/kg/min via INTRAVENOUS
  Filled 2016-08-04 (×2): qty 100

## 2016-08-04 NOTE — H&P (Signed)
History and Physical    Frank [name] RCV:[id number] DOB: 11/13/1965 DOA: 08/04/2016   PCP: Angelica Chessman, MD   Patient coming from:  Home   Chief Complaint: Abdominal pain   HPI: Frank [name] is a 51 y.o. male  With a history of ESRD on HD MWF 2 failed transplant in 2006, NICM, h/o DVT on Chronic coumadin, DM,presenting with severe thoracic and lower back pain as well as diffuse abdominal pain, nausea and non bloody emesis. He denies diarrhea, abdominal distension. He reports intermittent constipation. He reports his abdominal pain is similar to some other events in the past  No alleviating factors. Associated with nausea, without fevers. Able to pass gas. Denies constipation, denies diarrhea. Denies blood in the stools. Denies shortness of breath or cough.  No recent trips or food poisoning. No changes in medication or over the counter products. No sick contacts.  Denies a history of hepatitis. He does smoke. No ETOH or recreational drug use. He denies any headaches or vision changes. He reports generalized weakness without any unilateral or sensory deficits. He is not making urine.Last admission to the hospital was at the end of Dec 2017 for change of HD fistula.      ED Course:  BP (!) 188/113   Pulse 84   Temp 98.9 F (37.2 C) (Oral)   Resp 17   SpO2 100%   CT angio chest is equivocal for dissection at the proc [facility] artery and another at the proximal LCIA . Vascular surgery involved (Dr. Donzetta Matters) , evaluating/ ruling out this finding.   Received Norvasc, Apresoline for BP control INR is 1  He received IV  Zofran and MSO4  potassium 5.1 glucose 67 creatinine 9.65 alkaline phosphatase 155 lipase 83 AST 20 ALT 10 T bili.4 white count 5.5 hemoglobin 8.5 MCZ 87.3 platelets 214 last CT of the abdomen taken on one 8 2018 was negative for acute process, showing mild fatty infiltration of the liver, no evidence of bowel obstruction or inflammation. Echo 4/11/ 2017 35-40%, global hypokinesis,  severe LAE, moderate RAE, moderately   dilated and hypertrophied RV with reduced systolic function,  moderate TR, RVSP 52 mmHg, persistent small pericardial effusion  Review of Systems: As per HPI otherwise 10 point review of systems negative.   Past Medical History:  Diagnosis Date  . Anemia   . Anxiety   . Chronic combined systolic and diastolic CHF (congestive heart failure) (HCC)    a. EF 20-25% by echo in 08/2015 b. echo 10/2015: EF 35-40%, diffuse HK, severe LAE, moderate RAE, small pericardial effusion  . Complication of anesthesia    itching, sore throat  . Depression   . Dialysis patient ([place])   . ESRD (end stage renal disease) ([place])    due to HTN per patient, followed at [facility], s/p failed kidney transplant - dialysis Tue, Th, Sat  . Hyperkalemia 12/2015  . Hypertension   . Junctional rhythm    a. noted in 08/2015: hyperkalemic at that time  b. 12/2015: presented in junctional rhythm w/ K+ of 6.6. Resolved with improvement of K+ levels.  . Nonischemic cardiomyopathy ([place])    a. 08/2014: cath showing minimal CAD, but tortuous arteries noted.   . Personal history of DVT (deep vein thrombosis)/ PE 05/26/2016   In Oct 2015 had small subsemental LUL PE w/o DVT (LE dopplers neg) and was felt to be HD cath related, treated w coumadin.  IN May 2016 had small vein DVT (acute/subacute) in the R basilic/  brachial veins of the RUE, resumed on coumadin.  Had R sided HD cath at that time.    . Renal insufficiency   . Shortness of breath   . Type II diabetes mellitus (HCC)    No history per patient, but remains under history as A1c would not be accurate given on dialysis    Past Surgical History:  Procedure Laterality Date  . CAPD INSERTION    . CAPD REMOVAL    . INGUINAL HERNIA REPAIR Right 02/14/2015   Procedure: REPAIR INCARCERATED RIGHT INGUINAL HERNIA;  Surgeon: [name], MD;  Location: [place];  Service: General;  Laterality: Right;  . INSERTION OF DIALYSIS CATHETER Right 09/23/2015    Procedure: exchange of Right internal Dialysis Catheter.;  Surgeon: [name], MD;  Location: [place];  Service: Vascular;  Laterality: Right;  . IR GENERIC HISTORICAL  07/16/2016   IR US GUIDE VASC ACCESS LEFT 07/16/2016 Corrie Mckusick, DO [facility]-INTERV RAD  . IR GENERIC HISTORICAL Left 07/16/2016   IR THROMBECTOMY AV FISTULA W/THROMBOLYSIS/PTA INC/SHUNT/IMG LEFT 07/16/2016 Corrie Mckusick, DO [facility]INTER[facility]  . KIDNEY RECEIPIENT  2006   failed and started HD in March 2014  . LEFT HEART CATHETERIZATION WITH CORONARY ANGIOGRAM N/A 09/02/2014   Procedure: LEFT HEART CATHETERIZATION WITH CORONARY ANGIOGRAM;  Surgeon: [name], MD;  Location: [facility] CATH LAB;  Service: Cardiovascular;  Laterality: N/A;    Social History Social History   Social History  . Marital status: Married    Spouse name: N/A  . Number of children: 3  . Years of education: [employer]   Occupational History  . Not on file.   Social History Main Topics  . Smoking status: Former Smoker    Packs/day: 0.00    Years: 1.00    Types: Cigarettes  . Smokeless tobacco: Never Used     Comment: quit Jan 2014  . Alcohol use No  . Drug use: No  . Sexual activity: Not Currently   Other Topics Concern  . Not on file   Social History Narrative   Owns own plumbing company     Allergies  Allergen Reactions  . Butalbital-Apap-Caffeine Shortness Of Breath, Swelling and Other (See Comments)    Swelling in throat  . Ferrlecit [Na Ferric Gluc Cplx In Sucrose] Shortness Of Breath, Swelling and Other (See Comments)    Swelling in throat  . Minoxidil Shortness Of Breath  . Tylenol [Acetaminophen] Anaphylaxis and Swelling  . Darvocet [Propoxyphene N-Acetaminophen] Hives    Family History  Problem Relation Age of Onset  . Hypertension Other       Prior to Admission medications   Medication Sig Start Date End Date Taking? Authorizing Provider  allopurinol (ZYLOPRIM) 100 MG tablet Take 100 mg by mouth daily.    Historical  Provider, MD  amLODipine (NORVASC) 10 MG tablet Take 10 mg by mouth 2 (two) times daily.     Historical Provider, MD  atorvastatin (LIPITOR) 40 MG tablet Take 1 tablet (40 mg total) by mouth daily at 6 PM. 09/02/14   Barton Dubois, MD  carvedilol (COREG) 25 MG tablet Take 25 mg by mouth 2 (two) times daily with a meal.    Historical Provider, MD  cinacalcet (SENSIPAR) 30 MG tablet Take 30 mg by mouth every evening.     Historical Provider, MD  cloNIDine (CATAPRES - DOSED IN MG/24 HR) 0.3 mg/24hr patch Place 0.3 mg onto the skin once a week.    Historical Provider, MD  doxazosin (CARDURA) 2  MG tablet Take 2 mg by mouth daily.    Historical Provider, MD  famotidine (PEPCID) 20 MG tablet Take 1 tablet (20 mg total) by mouth 2 (two) times daily. 02/13/16   Charlesetta Shanks, MD  gabapentin (NEURONTIN) 100 MG capsule Take 1 capsule (100 mg total) by mouth at bedtime. Patient taking differently: Take 100 mg by mouth 3 (three) times daily.  05/21/16   [name], DO  hydrALAZINE (APRESOLINE) 100 MG tablet Take 100 mg by mouth 2 (two) times daily.     Historical Provider, MD  isosorbide mononitrate (IMDUR) 60 MG 24 hr tablet Take 1 tablet (60 mg total) by mouth daily. 01/09/16   Smiley Houseman, MD  methocarbamol (ROBAXIN) 500 MG tablet Take 1 tablet (500 mg total) by mouth 2 (two) times daily as needed for muscle spasms. 07/11/16   [name], MD  omeprazole (PRILOSEC) 20 MG capsule Take 20 mg by mouth daily. 07/01/13   Historical Provider, MD  ondansetron (ZOFRAN ODT) 4 MG disintegrating tablet Take 1 tablet (4 mg total) by mouth every 8 (eight) hours as needed for nausea or vomiting. 05/16/16   Sherwood Gambler, MD  oxycodone (OXY-IR) 5 MG capsule Take 1 capsule (5 mg total) by mouth every 4 (four) hours as needed for pain. 05/30/16   [name], MD  oxyCODONE-acetaminophen (PERCOCET/ROXICET) 5-325 MG tablet Take 1 tablet by mouth every 6 (six) hours as needed for severe pain.    Historical  Provider, MD  predniSONE (DELTASONE) 10 MG tablet Take 10 mg by mouth daily with breakfast.    Historical Provider, MD  sevelamer carbonate (RENVELA) 800 MG tablet Take 1,600 mg by mouth 3 (three) times daily with meals.     Historical Provider, MD  warfarin (COUMADIN) 2 MG tablet Take 2 mg by mouth daily. Take with 53m tablet to equal 761m7/12/17   Historical Provider, MD  warfarin (COUMADIN) 5 MG tablet Take 5 mg by mouth See admin instructions. Take 1 tablet (5 mg) by mouth with a 2 mg tablet for a 7 mg dose 01/31/16   Historical Provider, MD    Physical Exam:    Vitals:   08/04/16 0600 08/04/16 0615 08/04/16 0630 08/04/16 0700  BP: 175/96 (!) 168/101 (!) 181/102 (!) 188/113  Pulse: 91 95 84   Resp:      Temp:      TempSrc:      SpO2: 96% 93% 100%        Constitutional: NAD but very uncomfortable due to pain Vitals:   08/04/16 0600 08/04/16 0615 08/04/16 0630 08/04/16 0700  BP: 175/96 (!) 168/101 (!) 181/102 (!) 188/113  Pulse: 91 95 84   Resp:      Temp:      TempSrc:      SpO2: 96% 93% 100%    Eyes: PERRL, lids and conjunctivae normal ENMT: Mucous membranes are dry. Posterior pharynx clear of any exudate or lesions.Normal dentition.  Neck: normal, supple, no masses, no thyromegaly Respiratory: clear to auscultation bilaterally, no wheezing, no crackles. Normal respiratory effort. No accessory muscle use.  Cardiovascular: Regular rate and rhythm, no murmurs / rubs / gallops. No extremity edema. 2+ pedal pulses. No carotid bruits.  Abdomen: significant tenderness on light palpation on the Left upper quadrant, no distention, no masses palpated. No hepatosplenomegaly. Bowel sounds positive.  Musculoskeletal: no clubbing / cyanosis. No joint deformity upper and lower extremities. Good ROM, no contractures. Normal muscle tone.  Skin: no rashes, lesions,  ulcers. Very dry  Neurologic: CN 2-12 grossly intact. Sensation intact, DTR normal. Strength 5/5 in all 4.  Psychiatric:  Normal judgment and insight. Alert and oriented x 3. Depressed mood.     Labs on Admission: I have personally reviewed following labs and imaging studies  CBC:  Recent Labs Lab 07/28/16 2310 08/04/16 0232  WBC 6.5 5.5  NEUTROABS  --  3.3  HGB 9.2* 8.5*  HCT 28.4* 26.9*  MCV 89.6 87.3  PLT 223 423    Basic Metabolic Panel:  Recent Labs Lab 07/28/16 2310 08/04/16 0232  NA 138 141  K 5.9* 5.1  CL 97* 99*  CO2 30 28  GLUCOSE 76 67  BUN 40* 26*  CREATININE 13.18* 9.65*  CALCIUM 8.5* 8.2*    GFR: Estimated Creatinine Clearance: 9.5 mL/min (by C-G formula based on SCr of 9.65 mg/dL (H)).  Liver Function Tests:  Recent Labs Lab 07/28/16 2310 08/04/16 0232  AST 20 20  ALT 10* 10*  ALKPHOS 161* 155*  BILITOT 0.6 0.4  PROT 7.8 7.3  ALBUMIN 3.4* 3.1*    Recent Labs Lab 07/28/16 2310 08/04/16 0232  LIPASE 86* 83*   No results for input(s): AMMONIA in the last 168 hours.  Coagulation Profile:  Recent Labs Lab 08/04/16 0638  INR 1.09    Cardiac Enzymes: No results for input(s): CKTOTAL, CKMB, CKMBINDEX, TROPONINI in the last 168 hours.  BNP (last 3 results) No results for input(s): PROBNP in the last 8760 hours.  HbA1C: No results for input(s): HGBA1C in the last 72 hours.  CBG:  Recent Labs Lab 07/29/16 0535 07/29/16 0611  GLUCAP 33* 111*    Lipid Profile: No results for input(s): CHOL, HDL, LDLCALC, TRIG, CHOLHDL, LDLDIRECT in the last 72 hours.  Thyroid Function Tests: No results for input(s): TSH, T4TOTAL, FREET4, T3FREE, THYROIDAB in the last 72 hours.  Anemia Panel: No results for input(s): VITAMINB12, FOLATE, FERRITIN, TIBC, IRON, RETICCTPCT in the last 72 hours.  Urine analysis:    Component Value Date/Time   COLORURINE YELLOW 10/18/2013 0419   APPEARANCEUR CLEAR 10/18/2013 0419   LABSPEC 1.008 10/18/2013 0419   PHURINE 8.5 (H) 10/18/2013 0419   GLUCOSEU 100 (A) 10/18/2013 0419   HGBUR TRACE (A) 10/18/2013 0419    BILIRUBINUR NEGATIVE 10/18/2013 0419   KETONESUR NEGATIVE 10/18/2013 0419   PROTEINUR 100 (A) 10/18/2013 0419   UROBILINOGEN 0.2 10/18/2013 0419   NITRITE NEGATIVE 10/18/2013 0419   LEUKOCYTESUR NEGATIVE 10/18/2013 0419    Sepsis Labs: _0 (procalcitonin:4,lacticidven:4) )No results found for this or any previous visit (from the past 240 hour(s)).   Radiological Exams on Admission: Ct Angio Chest/abd/pel For Dissection W And/or Wo Contrast  Result Date: 08/04/2016 CLINICAL DATA:  Back pain and concern for aortic dissection EXAM: CT ANGIOGRAPHY CHEST, ABDOMEN AND PELVIS TECHNIQUE: Multidetector CT imaging through the chest, abdomen and pelvis was performed using the standard protocol during bolus administration of intravenous contrast. Multiplanar reconstructed images and MIPs were obtained and reviewed to evaluate the vascular anatomy. CONTRAST:  100 mL Isovue 370 IV COMPARISON:  None. FINDINGS: CTA CHEST FINDINGS Cardiovascular: The heart is enlarged. There is a small pericardial effusion measuring 6 mm. There is a normal variant aortic arch branching pattern with the brachiocephalic and left common carotid arteries sharing a common origin. Precontrast imaging shows no intramural hematoma of the thoracic aorta. There is aortic atherosclerosis. There is a small there is a small outpouching from the inferior aspect of the aortic arch just distal to the  origin of the left subclavian artery. There is a linear opacity within the proximal brachiocephalic artery (coronal image 50). Mediastinum/Nodes: No mediastinal, hilar or axillary lymphadenopathy. The visualized thyroid and thoracic esophageal course are unremarkable. Lungs/Pleura: Lungs are clear. No pleural effusion or pneumothorax. Musculoskeletal: No chest wall abnormality. No acute or significant osseous findings. Review of the MIP images confirms the above findings. CTA ABDOMEN AND PELVIS FINDINGS VASCULAR Aorta: Normal caliber aorta without  aneurysm, dissection, vasculitis or significant stenosis. Celiac: Patent without evidence of aneurysm, dissection, vasculitis or significant stenosis. SMA: Patent without evidence of aneurysm, dissection, vasculitis or significant stenosis. Renals: There is atherosclerotic narrowing at the origins of both renal arteries. IMA: Patent without evidence of aneurysm, dissection, vasculitis or significant stenosis. Inflow: There is a small focal dissection of the left common iliac artery. There is moderate to severe stenosis at the origin of the left internal iliac artery. There is extensive atherosclerotic calcification of the left internal iliac artery. There is no aneurysm or severe stenosis of the right iliac arteries. Review of the MIP images confirms the above findings. NON-VASCULAR Hepatobiliary: No focal liver abnormality is seen. No gallstones, gallbladder wall thickening, or biliary dilatation. Pancreas: Unremarkable. No pancreatic ductal dilatation or surrounding inflammatory changes. Spleen: Normal in size without focal abnormality. Adrenals/Urinary Tract: The adrenal glands are normal. Both kidneys are severely atrophic with multiple renal cysts. The largest cyst measures 4.2 cm. Stomach/Bowel: No dilated small bowel or other evidence of obstruction. No enteric or colonic inflammation. Lymphatic: No abdominal or pelvic lymphadenopathy. Reproductive: Unremarkable prostate and seminal vesicles. Other: Soft tissue mass in the left iliac fossa is suspected to be a failed renal transplant. Musculoskeletal: There is no bony spinal canal stenosis. No lytic or blastic lesions. Review of the MIP images confirms the above findings. IMPRESSION: 1. No aortic dissection. 2. Linear defect along wall of the proximal brachiocephalic artery as it arises from the aortic arch. While this may be secondary to motion, no definite motion could be identified in adjacent structures. This could indicate a short segment dissection. A  cardiac gated study might be helpful to determine if this is a true finding or motion artifact. 3. Focal outpouching from the undersurface of the aortic arch is in the typical location of the ductus diverticulum, which is the favored diagnosis. A small pseudo aneurysm or penetrating ulcer is considered less likely. 4. Small focal dissection within the proximal left common iliac artery. 5. Extensive aortic and iliac atherosclerosis. Severe narrowing of the left internal iliac artery secondary to calcified and noncalcified plaque. 6. Soft tissue mass in the left iliac fossa is likely a failed renal transplant. Electronically Signed   By: Ulyses Jarred M.D.   On: 08/04/2016 05:22    EKG: Independently reviewed.  Assessment/Plan Active Problems:   Anemia of chronic kidney failure   ESRD on hemodialysis (HCC)   Dyslipidemia   Essential hypertension   Hypertensive urgency   Constipation   Nonischemic cardiomyopathy (HCC)   Chronic back pain   End-stage renal disease on hemodialysis (HCC)    Abdominal pain with nausea and vomiting, in the setting of chronic back pain  unclear etiology. No bleeding issues noted. Recent CT abdomin and pelvis 1/8 negative for acute findings. CT angio chest is equivocal for dissection at the proc [facility] artery and another at the proximal LCIA . Vascular surgery involved (Dr. Donzetta Matters) , evaluating/ ruling out this finding. WBC normal.   Admit to tele obs  NPO for now, may advance to  Clear liquids if no surgical intervention is indicated  IV and po pain control.  IV antiemetics  Continue Protonix  Gentle hydration    ESRD on HD MWF, current Cr 9.65  Recent IR thrombectomy of AVF RUE , now functioning well.  Will notify Nephrology of patient's admission  Abnormal electrolytes as per Renal  Check CMET in am   Hypertension with Hypertensive urgency  BP (!) 188/113   Pulse 84. Unclear of patient's compliance to meds  Continue home meds and add IV hydralazine for BP  160/90  Consider workup for secondary hypertension as outpatient  History of DVT/PE , on Chronic Coumadin, unclear if patient is compliant, INR 1.09  Will order SCDs for now, in case that surgery is indicated Will resume Coumadin in am  Per Pharmacy   Anemia of chronic disease Hemoglobin on admission 8.5 Baseline Hb 9s   No bleeding issues noted  Repeat CBC in am No transfusion is indicated at this time   Type II Diabetes Current blood sugar level is 67 Lab Results  Component Value Date   HGBA1C 5.4 02/11/2015   Hgb A1C  SSI Heart healthy carb modified diet.  Hyperlipidemia Continue home statins   DVT prophylaxis: SCD'still able to take Coumadin  Code Status:   Full      Family Communication:  Discussed with patient Disposition Plan: Expect patient to be discharged to home after condition improves Consults called:    Vascular Surgery, Dr. Donzetta Matters, Nephrology  Admission status:Tele  Obs    [facility] E, PA-C [facility]   08/04/2016, 7:39 AM

## 2016-08-04 NOTE — ED Notes (Signed)
When I went in to give patient his catapress, he didn't want them because he just wanted pain medicine and apple juice. I told him that the diet order says he is NPO except sips with meds and asked  how his nausea was from earlier. He informed me that he wanted apple juice and wants an MD. He became very argumentative and ultimately refused the BP meds.

## 2016-08-04 NOTE — Progress Notes (Signed)
PT Cancellation Note  Patient Details Name: Frank [name] MRN: [id number] DOB: [birth year]   Cancelled Treatment:    Reason Eval/Treat Not Completed: Medical issues which prohibited therapy.  Patient being transferred to ICU.  Will return at later time for PT evaluation.   Despina Pole 08/04/2016, 6:23 PM Carita Pian. Sanjuana Kava, [place] Pager [phone]

## 2016-08-04 NOTE — Progress Notes (Signed)
Patient arrived to unit via bed at approximately 1500. Telemetry placed per order and CCMD notified.  No skin issues noted at this time. IV patent to the right ac.

## 2016-08-04 NOTE — Consult Note (Signed)
PULMONARY / CRITICAL CARE MEDICINE   Name: Frank [name] MRN: [id number] DOB: 1965/10/04    ADMISSION DATE:  08/04/2016 CONSULTATION DATE:  08/04/2016  REFERRING MD:  Shon Millet DO  CHIEF COMPLAINT:  Hypertensive crisis  HISTORY OF PRESENT ILLNESS:   Frank [name] is a 51 year old gentleman with history of ESRD on dialysis, chronic combined systolic and diastolic CHF, hypertension, DVT on Coumadin and nonischemic cardiomyopathy who presented with abdominal pain with nausea and vomiting.  A CTA abdomen was obtained to rule out dissection and showed two areas which could be dissection at the origin of the L brachiocephalic artery and at the proximal L common iliac artery.   Patient's blood pressures have been elevated during admission despite oral antihypertensive medications. PCCM was consulted for better management of blood pressure.  PAST MEDICAL HISTORY :  He  has a past medical history of Anemia; Anxiety; Chronic combined systolic and diastolic CHF (congestive heart failure) ([place]); Complication of anesthesia; Depression; Dialysis patient [facility]); ESRD (end stage renal disease) ([place]); Hyperkalemia (12/2015); Hypertension; Junctional rhythm; Nonischemic cardiomyopathy ([place]); Personal history of DVT (deep vein thrombosis)/ PE (05/26/2016); Renal insufficiency; Shortness of breath; and Type II diabetes mellitus ([place]).  PAST SURGICAL HISTORY: He  has a past surgical history that includes KIDNEY RECEIPIENT (2006); CAPD insertion; CAPD removal; left heart catheterization with coronary angiogram (N/A, 09/02/2014); Inguinal hernia repair (Right, 02/14/2015); Insertion of dialysis catheter (Right, 09/23/2015); ir generic historical (07/16/2016); and ir generic historical (Left, 07/16/2016).  Allergies  Allergen Reactions  . Butalbital-Apap-Caffeine Shortness Of Breath, Swelling and Other (See Comments)    Swelling in throat  . Ferrlecit [Na Ferric Gluc Cplx In Sucrose] Shortness Of Breath,  Swelling and Other (See Comments)    Swelling in throat  . Minoxidil Shortness Of Breath  . Tylenol [Acetaminophen] Anaphylaxis and Swelling  . Darvocet [Propoxyphene N-Acetaminophen] Hives    No current facility-administered medications on file prior to encounter.    Current Outpatient Prescriptions on File Prior to Encounter  Medication Sig  . allopurinol (ZYLOPRIM) 100 MG tablet Take 100 mg by mouth daily.  [name] amLODipine (NORVASC) 10 MG tablet Take 10 mg by mouth 2 (two) times daily.   [name] atorvastatin (LIPITOR) 40 MG tablet Take 1 tablet (40 mg total) by mouth daily at 6 PM.  . carvedilol (COREG) 25 MG tablet Take 25 mg by mouth 2 (two) times daily with a meal.  . cinacalcet (SENSIPAR) 30 MG tablet Take 30 mg by mouth every evening.   . cloNIDine (CATAPRES - DOSED IN MG/24 HR) 0.3 mg/24hr patch Place 0.3 mg onto the skin once a week.  . doxazosin (CARDURA) 2 MG tablet Take 2 mg by mouth daily.  . famotidine (PEPCID) 20 MG tablet Take 1 tablet (20 mg total) by mouth 2 (two) times daily.  [name] gabapentin (NEURONTIN) 100 MG capsule Take 1 capsule (100 mg total) by mouth at bedtime. (Patient taking differently: Take 100 mg by mouth 3 (three) times daily. )  . hydrALAZINE (APRESOLINE) 100 MG tablet Take 100 mg by mouth 2 (two) times daily.   . isosorbide mononitrate (IMDUR) 60 MG 24 hr tablet Take 1 tablet (60 mg total) by mouth daily.  . methocarbamol (ROBAXIN) 500 MG tablet Take 1 tablet (500 mg total) by mouth 2 (two) times daily as needed for muscle spasms.  [name] omeprazole (PRILOSEC) 20 MG capsule Take 20 mg by mouth daily.  . ondansetron (ZOFRAN ODT) 4 MG disintegrating tablet Take 1 tablet (4 mg total)  by mouth every 8 (eight) hours as needed for nausea or vomiting.  [name] oxycodone (OXY-IR) 5 MG capsule Take 1 capsule (5 mg total) by mouth every 4 (four) hours as needed for pain.  [name] oxyCODONE-acetaminophen (PERCOCET/ROXICET) 5-325 MG tablet Take 1 tablet by mouth every 6 (six) hours as needed for  severe pain.  . predniSONE (DELTASONE) 10 MG tablet Take 10 mg by mouth daily with breakfast.  . sevelamer carbonate (RENVELA) 800 MG tablet Take 1,600 mg by mouth 3 (three) times daily with meals.   . warfarin (COUMADIN) 2 MG tablet Take 2 mg by mouth daily. Take with 33m tablet to equal 756m . warfarin (COUMADIN) 5 MG tablet Take 5 mg by mouth See admin instructions. Take 1 tablet (5 mg) by mouth with a 2 mg tablet for a 7 mg dose    FAMILY HISTORY:  His indicated that the status of his other is unknown.    SOCIAL HISTORY: He  reports that he has quit smoking. His smoking use included Cigarettes. He smoked 0.00 packs per day for 1.00 year. He has never used smokeless tobacco. He reports that he does not drink alcohol or use drugs.  REVIEW OF SYSTEMS:   Review of Systems  Constitutional: Negative for chills and fever.  Eyes: Negative for blurred vision and double vision.  Respiratory: Negative for cough and shortness of breath.   Cardiovascular: Negative for chest pain.  Gastrointestinal: Positive for abdominal pain and nausea. Negative for vomiting.     SUBJECTIVE:  Patient states he presented to the ED with back pain and abdominal pain. He reports being without his blood pressure medications for about 10 days due to difficulty obtaining prescriptions as a result of changing providers.  VITAL SIGNS: BP (!) 192/110 (BP Location: Right Arm)   Pulse 98   Temp 99 F (37.2 C) (Oral)   Resp 20   Wt 78.5 kg (173 lb 1 oz)   SpO2 100%   BMI 22.22 kg/m   HEMODYNAMICS:    VENTILATOR SETTINGS:    INTAKE / OUTPUT: No intake/output data recorded.  PHYSICAL EXAMINATION: General:  Appears stated age, sitting up in bed in minimal distress Neuro:  A&O x 3 HEENT:  [place]/AT, PERRLA Cardiovascular:  RRR, no m/g/r, No lower extremity edema Lungs:  CTA bilaterally on anterior and posterior side Abdomen:  Mildly tender, non distended, no masses or guarding present  Musculoskeletal:  No  joint deformity in upper and lower extremity Skin:  Warm and dry, no rashes  LABS:  BMET  Recent Labs Lab 07/28/16 2310 08/04/16 0232  NA 138 141  K 5.9* 5.1  CL 97* 99*  CO2 30 28  BUN 40* 26*  CREATININE 13.18* 9.65*  GLUCOSE 76 67    Electrolytes  Recent Labs Lab 07/28/16 2310 08/04/16 0232  CALCIUM 8.5* 8.2*    CBC  Recent Labs Lab 07/28/16 2310 08/04/16 0232  WBC 6.5 5.5  HGB 9.2* 8.5*  HCT 28.4* 26.9*  PLT 223 214    Coag's  Recent Labs Lab 08/04/16 0638  INR 1.09    Sepsis Markers No results for input(s): LATICACIDVEN, PROCALCITON, O2SATVEN in the last 168 hours.  ABG No results for input(s): PHART, PCO2ART, PO2ART in the last 168 hours.  Liver Enzymes  Recent Labs Lab 07/28/16 2310 08/04/16 0232  AST 20 20  ALT 10* 10*  ALKPHOS 161* 155*  BILITOT 0.6 0.4  ALBUMIN 3.4* 3.1*    Cardiac Enzymes No results for input(s):  TROPONINI, PROBNP in the last 168 hours.  Glucose  Recent Labs Lab 07/29/16 0535 07/29/16 0611 08/04/16 1646  GLUCAP 33* 111* 73    Imaging Ct Angio Chest/abd/pel For Dissection W And/or Wo Contrast  Result Date: 08/04/2016 CLINICAL DATA:  Back pain and concern for aortic dissection EXAM: CT ANGIOGRAPHY CHEST, ABDOMEN AND PELVIS TECHNIQUE: Multidetector CT imaging through the chest, abdomen and pelvis was performed using the standard protocol during bolus administration of intravenous contrast. Multiplanar reconstructed images and MIPs were obtained and reviewed to evaluate the vascular anatomy. CONTRAST:  100 mL Isovue 370 IV COMPARISON:  None. FINDINGS: CTA CHEST FINDINGS Cardiovascular: The heart is enlarged. There is a small pericardial effusion measuring 6 mm. There is a normal variant aortic arch branching pattern with the brachiocephalic and left common carotid arteries sharing a common origin. Precontrast imaging shows no intramural hematoma of the thoracic aorta. There is aortic atherosclerosis. There is  a small there is a small outpouching from the inferior aspect of the aortic arch just distal to the origin of the left subclavian artery. There is a linear opacity within the proximal brachiocephalic artery (coronal image 50). Mediastinum/Nodes: No mediastinal, hilar or axillary lymphadenopathy. The visualized thyroid and thoracic esophageal course are unremarkable. Lungs/Pleura: Lungs are clear. No pleural effusion or pneumothorax. Musculoskeletal: No chest wall abnormality. No acute or significant osseous findings. Review of the MIP images confirms the above findings. CTA ABDOMEN AND PELVIS FINDINGS VASCULAR Aorta: Normal caliber aorta without aneurysm, dissection, vasculitis or significant stenosis. Celiac: Patent without evidence of aneurysm, dissection, vasculitis or significant stenosis. SMA: Patent without evidence of aneurysm, dissection, vasculitis or significant stenosis. Renals: There is atherosclerotic narrowing at the origins of both renal arteries. IMA: Patent without evidence of aneurysm, dissection, vasculitis or significant stenosis. Inflow: There is a small focal dissection of the left common iliac artery. There is moderate to severe stenosis at the origin of the left internal iliac artery. There is extensive atherosclerotic calcification of the left internal iliac artery. There is no aneurysm or severe stenosis of the right iliac arteries. Review of the MIP images confirms the above findings. NON-VASCULAR Hepatobiliary: No focal liver abnormality is seen. No gallstones, gallbladder wall thickening, or biliary dilatation. Pancreas: Unremarkable. No pancreatic ductal dilatation or surrounding inflammatory changes. Spleen: Normal in size without focal abnormality. Adrenals/Urinary Tract: The adrenal glands are normal. Both kidneys are severely atrophic with multiple renal cysts. The largest cyst measures 4.2 cm. Stomach/Bowel: No dilated small bowel or other evidence of obstruction. No enteric or  colonic inflammation. Lymphatic: No abdominal or pelvic lymphadenopathy. Reproductive: Unremarkable prostate and seminal vesicles. Other: Soft tissue mass in the left iliac fossa is suspected to be a failed renal transplant. Musculoskeletal: There is no bony spinal canal stenosis. No lytic or blastic lesions. Review of the MIP images confirms the above findings. IMPRESSION: 1. No aortic dissection. 2. Linear defect along wall of the proximal brachiocephalic artery as it arises from the aortic arch. While this may be secondary to motion, no definite motion could be identified in adjacent structures. This could indicate a short segment dissection. A cardiac gated study might be helpful to determine if this is a true finding or motion artifact. 3. Focal outpouching from the undersurface of the aortic arch is in the typical location of the ductus diverticulum, which is the favored diagnosis. A small pseudo aneurysm or penetrating ulcer is considered less likely. 4. Small focal dissection within the proximal left common iliac artery. 5. Extensive aortic and  iliac atherosclerosis. Severe narrowing of the left internal iliac artery secondary to calcified and noncalcified plaque. 6. Soft tissue mass in the left iliac fossa is likely a failed renal transplant. Electronically Signed   By: Ulyses Jarred M.D.   On: 08/04/2016 05:22     STUDIES:  CT angio of chest/abd/pelvis 1/14 >> Linear defect along wall of the proximal brachiocephalic artery as it arises from the aortic arc  CULTURES: none  ANTIBIOTICS: none  SIGNIFICANT EVENTS: 1/14 >> admitted for abdominal pain and found to have defect along the wall of the proximal brachiocephalic artery   LINES/TUBES: Fistula in left upper extremity   DISCUSSION: Frank [name] is a 51 year old male who was admitted for abdominal pain with nausea and vomiting and found to be hypertensive. He also complained of back pain of which prompted a CTA of chest/abdomen/pelvis  and found to have a linear defect along the wall of the proximal brachiocephalic artery. Despite his oral blood pressure medications he remained hypertensive and PCCM was consulted for better management of blood pressure.  ASSESSMENT / PLAN:  PULMONARY A: No issues P:   No plans  CARDIOVASCULAR A:  Hypertensive crisis Chronic combined systolic and diastolic heart failure, 07/6943 LV EF 35-40% Nonischemic cardiomyopathy  P:  Esmolol - titrate as needed  Continue home blood pressure medications Cardiac monitor CBC in morning   RENAL A:   ESRD on dialysis  P:   Plan for HD tomorrow   GASTROINTESTINAL A:   Abdominal pain - unclear cause  P:   Protonix  Oxycodone 73m  Zofran Toradol  HEMATOLOGIC A:   Anemia of chronic disease P:  ESA with dialysis   INFECTIOUS A:   No issues P:   No plans  ENDOCRINE A:   Diabetes Mellitus, type II  P:   SSI CBG monitoring  NEUROLOGIC A:   Alert and oriented x 3 P:   Continue to monitor  FAMILY  - Updates: No family at bedside  - Inter-disciplinary family meet or Palliative Care meeting due by:  08/11/16   Pulmonary and CClark MillsPager: [phone] 08/04/2016, 6:29 PM

## 2016-08-04 NOTE — ED Provider Notes (Signed)
Patient signed out to me by N Pisciotta PA-C at shift change. In brief, he is a 51 year old male well known to the ED who presents with back pain and abdominal pain. CT showed possible dissection of the proximal brachiocephalic artery and proximal left common iliac artery. Vascular surgery was consulted. Dr. Donzetta Matters saw and evaluated patient. Please see his separate note for details. He recommends medical admission and will order [place]. BP is also noted to be very high. Home meds given. Spoke with hospitalist service who will come to see patient. Appreciate assistance.   Recardo Evangelist, PA-C 08/04/16 1042    Fredia Sorrow, MD 08/06/16 [phone]

## 2016-08-04 NOTE — Progress Notes (Signed)
Report called to nurse Seth Bake) and patient transferred to 2S via bed per transfer order.

## 2016-08-04 NOTE — Consult Note (Signed)
Renal Service Consult Note [facility]  Frank [name] 08/04/2016 Frank [name] Requesting Physician:  Dr. Maylene Roes  Reason for Consult:  ESRD pt w HTN'sive crisis HPI: The patient is a 51 y.o. year-old with history of DM2, NICM, HTN, combined CHF (35-40%) who presented to ED with back pain and abd pain and uncontrolled HTN.  CTA of chest/ abd/ pelvis showed two areas which could be dissection at the origin of the L brachiocephalic artery and at the proximal L common iliac artery.  Seen by vasc surgery, recommended medical management.   Patient recently started at a new HD unit in [employer] [employer] [place].  He says he had problems getting BP medication prescriptions due to the change in providers and went w/o his BP medications for about 10 days. His main c/o now is L upper back pain and also a sharp pain in his LUQ.    +nausea, no vomiting, no SOB.   HD records reviewed, pt coming off at dry wt of 75kg with small wt gains.   Hasn't missed HD recently, has signed off early at times.    ROS  no joint pain   no HA  no blurry vision  no rash  no diarrhea  no nausea/ vomiting  no dysuria  no difficulty voiding  no change in urine color    Past Medical History  Past Medical History:  Diagnosis Date  . Anemia   . Anxiety   . Chronic combined systolic and diastolic CHF (congestive heart failure) (HCC)    a. EF 20-25% by echo in 08/2015 b. echo 10/2015: EF 35-40%, diffuse HK, severe LAE, moderate RAE, small pericardial effusion  . Complication of anesthesia    itching, sore throat  . Depression   . Dialysis patient ([place])   . ESRD (end stage renal disease) ([place])    due to HTN per patient, followed at [facility], s/p failed kidney transplant - dialysis Tue, Th, Sat  . Hyperkalemia 12/2015  . Hypertension   . Junctional rhythm    a. noted in 08/2015: hyperkalemic at that time  b. 12/2015: presented in junctional rhythm w/ K+ of 6.6. Resolved with improvement of K+ levels.  .  Nonischemic cardiomyopathy ([place])    a. 08/2014: cath showing minimal CAD, but tortuous arteries noted.   . Personal history of DVT (deep vein thrombosis)/ PE 05/26/2016   In Oct 2015 had small subsemental LUL PE w/o DVT (LE dopplers neg) and was felt to be HD cath related, treated w coumadin.  IN May 2016 had small vein DVT (acute/subacute) in the R basilic/ brachial veins of the RUE, resumed on coumadin.  Had R sided HD cath at that time.    . Renal insufficiency   . Shortness of breath   . Type II diabetes mellitus (HCC)    No history per patient, but remains under history as A1c would not be accurate given on dialysis   Past Surgical History  Past Surgical History:  Procedure Laterality Date  . CAPD INSERTION    . CAPD REMOVAL    . INGUINAL HERNIA REPAIR Right 02/14/2015   Procedure: REPAIR INCARCERATED RIGHT INGUINAL HERNIA;  Surgeon: [name], MD;  Location: [place];  Service: General;  Laterality: Right;  . INSERTION OF DIALYSIS CATHETER Right 09/23/2015   Procedure: exchange of Right internal Dialysis Catheter.;  Surgeon: [name], MD;  Location: [place];  Service: Vascular;  Laterality: Right;  . IR GENERIC HISTORICAL  07/16/2016   IR US  GUIDE VASC ACCESS LEFT 07/16/2016 Corrie Mckusick, DO [facility]-INTERV RAD  . IR GENERIC HISTORICAL Left 07/16/2016   IR THROMBECTOMY AV FISTULA W/THROMBOLYSIS/PTA INC/SHUNT/IMG LEFT 07/16/2016 Corrie Mckusick, DO [facility]  . KIDNEY RECEIPIENT  2006   failed and started HD in March 2014  . LEFT HEART CATHETERIZATION WITH CORONARY ANGIOGRAM N/A 09/02/2014   Procedure: LEFT HEART CATHETERIZATION WITH CORONARY ANGIOGRAM;  Surgeon: [name], MD;  [facility] CATH LAB;  Service: Cardiovascular;  Laterality: N/A;   Family History  Family History  Problem Relation Age of Onset  . Hypertension Other    Social History  reports that he has quit smoking. His smoking use included Cigarettes. He smoked 0.00 packs per day for 1.00 year. He has never used  smokeless tobacco. He reports that he does not drink alcohol or use drugs. Allergies  Allergies  Allergen Reactions  . Butalbital-Apap-Caffeine Shortness Of Breath, Swelling and Other (See Comments)    Swelling in throat  . Ferrlecit [Na Ferric Gluc Cplx In Sucrose] Shortness Of Breath, Swelling and Other (See Comments)    Swelling in throat  . Minoxidil Shortness Of Breath  . Tylenol [Acetaminophen] Anaphylaxis and Swelling  . Darvocet [Propoxyphene N-Acetaminophen] Hives   Home medications Prior to Admission medications   Medication Sig Start Date End Date Taking? Authorizing Provider  allopurinol (ZYLOPRIM) 100 MG tablet Take 100 mg by mouth daily.    Historical Provider, MD  amLODipine (NORVASC) 10 MG tablet Take 10 mg by mouth 2 (two) times daily.     Historical Provider, MD  atorvastatin (LIPITOR) 40 MG tablet Take 1 tablet (40 mg total) by mouth daily at 6 PM. 09/02/14   Barton Dubois, MD  carvedilol (COREG) 25 MG tablet Take 25 mg by mouth 2 (two) times daily with a meal.    Historical Provider, MD  cinacalcet (SENSIPAR) 30 MG tablet Take 30 mg by mouth every evening.     Historical Provider, MD  cloNIDine (CATAPRES - DOSED IN MG/24 HR) 0.3 mg/24hr patch Place 0.3 mg onto the skin once a week.    Historical Provider, MD  doxazosin (CARDURA) 2 MG tablet Take 2 mg by mouth daily.    Historical Provider, MD  famotidine (PEPCID) 20 MG tablet Take 1 tablet (20 mg total) by mouth 2 (two) times daily. 02/13/16   Charlesetta Shanks, MD  gabapentin (NEURONTIN) 100 MG capsule Take 1 capsule (100 mg total) by mouth at bedtime. Patient taking differently: Take 100 mg by mouth 3 (three) times daily.  05/21/16   [name], DO  hydrALAZINE (APRESOLINE) 100 MG tablet Take 100 mg by mouth 2 (two) times daily.     Historical Provider, MD  isosorbide mononitrate (IMDUR) 60 MG 24 hr tablet Take 1 tablet (60 mg total) by mouth daily. 01/09/16   Smiley Houseman, MD  methocarbamol (ROBAXIN) 500 MG  tablet Take 1 tablet (500 mg total) by mouth 2 (two) times daily as needed for muscle spasms. 07/11/16   [name], MD  omeprazole (PRILOSEC) 20 MG capsule Take 20 mg by mouth daily. 07/01/13   Historical Provider, MD  ondansetron (ZOFRAN ODT) 4 MG disintegrating tablet Take 1 tablet (4 mg total) by mouth every 8 (eight) hours as needed for nausea or vomiting. 05/16/16   Sherwood Gambler, MD  oxycodone (OXY-IR) 5 MG capsule Take 1 capsule (5 mg total) by mouth every 4 (four) hours as needed for pain. 05/30/16   [name], MD  oxyCODONE-acetaminophen (PERCOCET/ROXICET)  5-325 MG tablet Take 1 tablet by mouth every 6 (six) hours as needed for severe pain.    Historical Provider, MD  predniSONE (DELTASONE) 10 MG tablet Take 10 mg by mouth daily with breakfast.    Historical Provider, MD  sevelamer carbonate (RENVELA) 800 MG tablet Take 1,600 mg by mouth 3 (three) times daily with meals.     Historical Provider, MD  warfarin (COUMADIN) 2 MG tablet Take 2 mg by mouth daily. Take with 64m tablet to equal 755m7/12/17   Historical Provider, MD  warfarin (COUMADIN) 5 MG tablet Take 5 mg by mouth See admin instructions. Take 1 tablet (5 mg) by mouth with a 2 mg tablet for a 7 mg dose 01/31/16   Historical Provider, MD   Liver Function Tests  Recent Labs Lab 07/28/16 2310 08/04/16 0232  AST 20 20  ALT 10* 10*  ALKPHOS 161* 155*  BILITOT 0.6 0.4  PROT 7.8 7.3  ALBUMIN 3.4* 3.1*    Recent Labs Lab 07/28/16 2310 08/04/16 0232  LIPASE 86* 83*   CBC  Recent Labs Lab 07/28/16 2310 08/04/16 0232  WBC 6.5 5.5  NEUTROABS  --  3.3  HGB 9.2* 8.5*  HCT 28.4* 26.9*  MCV 89.6 87.3  PLT 223 21811 Basic Metabolic Panel  Recent Labs Lab 07/28/16 2310 08/04/16 0232  NA 138 141  K 5.9* 5.1  CL 97* 99*  CO2 30 28  GLUCOSE 76 67  BUN 40* 26*  CREATININE 13.18* 9.65*  CALCIUM 8.5* 8.2*   Iron/TIBC/Ferritin/ %Sat    Component Value Date/Time   IRON 41 (L) 05/29/2016 1350   TIBC 190  (L) 05/29/2016 1350   FERRITIN 328 11/02/2015 0346   IRONPCTSAT 22 05/29/2016 1350    Vitals:   08/04/16 0630 08/04/16 0700 08/04/16 1434 08/04/16 1500  BP: (!) 181/102 (!) 188/113 (!) 190/119 (!) 192/110  Pulse: 84   98  Resp:    20  Temp:    99 F (37.2 C)  TempSrc:    Oral  SpO2: 100%   100%  Weight:    78.5 kg (173 lb 1 oz)   Exam Gen alert, looks uncomfortable at times in pain, no distress No rash, cyanosis or gangrene Sclera anicteric, throat clear  No jvd or bruits Chest clear bilat RRR no MRG Abd soft ntnd no mass or ascites +bs GU normal male MS no joint effusions or deformity Ext no LE or UE edema / no wounds or ulcers Neuro is alert, Ox 3 , nf  L arm AVF +bruit    Dialysis: MWF  [facility] 4h   2/2.25 bath  75kg  Hep 2000  LUA AVF  - Mircera 50 q 2 last 1/10  - Calcitriol 0.5 ug tiw  Assessment: 1. HTN'sive crisis - w possible associated early aortic dissection at two sites.  Would recommend aggressive management of pain and BP control w/ IV drip medication to get SBP down to 130-150 range.  Have d/w primary MD, will need transfer to SDU or ICU.  Cause of problem is pt ran out of medication, is usually well controlled on all his BP medications. No vol excess, is 3kg up from dry wt. Current regimen here is catapres 3 patch and po hydral/ coreg/ norvasc. Cardura added here.  2. ESRD HD mwf , plan HD tomorrow 3. NICM last EF 35-40% 4. Hx DVT / PE in the past - not sure but think that his coumadin was stopped as these  episodes were small/ local and felt to be complications of his long-term indwelling HD cath status.  5. L arm AVF - had declot in 12/17 but working well since then. 6. Anemia - gets esa    Plan - as above, HD tomorrow.   Kelly Splinter MD [employer] pager [phone]   08/04/2016, 4:46 PM

## 2016-08-04 NOTE — Consult Note (Addendum)
Hospital Consult    Reason for Consult:  Abdominal, back pain. 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 male with history of esrd presents with severe abdominal pain that is constant and exacerbated with po intake. It does radiate to his back but nowhere else. Associated with nausea and emesis x4 but without fever and chills. Alleviated by pain medicine. Denies any new headache or ever having stroke. Legs both feel normal. Has taken his 4 bp medications as prescribed with persistent bp elevation.   Past Medical History:  Diagnosis Date  . Anemia   . Anxiety   . Chronic combined systolic and diastolic CHF (congestive heart failure) (HCC)    a. EF 20-25% by echo in 08/2015 b. echo 10/2015: EF 35-40%, diffuse HK, severe LAE, moderate RAE, small pericardial effusion  . Complication of anesthesia    itching, sore throat  . Depression   . Dialysis patient ([place])   . ESRD (end stage renal disease) ([place])    due to HTN per patient, followed at [facility], s/p failed kidney transplant - dialysis Tue, Th, Sat  . Hyperkalemia 12/2015  . Hypertension   . Junctional rhythm    a. noted in 08/2015: hyperkalemic at that time  b. 12/2015: presented in junctional rhythm w/ K+ of 6.6. Resolved with improvement of K+ levels.  . Nonischemic cardiomyopathy ([place])    a. 08/2014: cath showing minimal CAD, but tortuous arteries noted.   . Personal history of DVT (deep vein thrombosis)/ PE 05/26/2016   In Oct 2015 had small subsemental LUL PE w/o DVT (LE dopplers neg) and was felt to be HD cath related, treated w coumadin.  IN May 2016 had small vein DVT (acute/subacute) in the R basilic/ brachial veins of the RUE, resumed on coumadin.  Had R sided HD cath at that time.    . Renal insufficiency   . Shortness of breath   . Type II diabetes mellitus (HCC)    No history per patient, but remains under history as A1c would not be  accurate given on dialysis    Past Surgical History:  Procedure Laterality Date  . CAPD INSERTION    . CAPD REMOVAL    . INGUINAL HERNIA REPAIR Right 02/14/2015   Procedure: REPAIR INCARCERATED RIGHT INGUINAL HERNIA;  Surgeon: [name], MD;  Location: [facility];  Service: General;  Laterality: Right;  . INSERTION OF DIALYSIS CATHETER Right 09/23/2015   Procedure: exchange of Right internal Dialysis Catheter.;  Surgeon: [name], MD;  Location: [place];  Service: Vascular;  Laterality: Right;  . IR GENERIC HISTORICAL  07/16/2016   IR US GUIDE VASC ACCESS LEFT 07/16/2016 Corrie Mckusick, DO [facility]  . IR GENERIC HISTORICAL Left 07/16/2016   IR THROMBECTOMY AV FISTULA W/THROMBOLYSIS/PTA INC/SHUNT/IMG LEFT 07/16/2016 Corrie Mckusick, DO [facility]  . KIDNEY RECEIPIENT  2006   failed and started HD in March 2014  . LEFT HEART CATHETERIZATION WITH CORONARY ANGIOGRAM N/A 09/02/2014   Procedure: LEFT HEART CATHETERIZATION WITH CORONARY ANGIOGRAM;  Surgeon: [name], MD;  Location: [facility] CATH LAB;  Service: Cardiovascular;  Laterality: N/A;    Allergies  Allergen Reactions  . Butalbital-Apap-Caffeine Shortness Of Breath, Swelling and Other (See Comments)    Swelling in throat  . Ferrlecit [Na Ferric Gluc Cplx In Sucrose] Shortness Of Breath, Swelling and Other (See Comments)    Swelling in throat  . Minoxidil Shortness Of Breath  .  Tylenol [Acetaminophen] Anaphylaxis and Swelling  . Darvocet [Propoxyphene N-Acetaminophen] Hives    Prior to Admission medications   Medication Sig Start Date End Date Taking? Authorizing Provider  allopurinol (ZYLOPRIM) 100 MG tablet Take 100 mg by mouth daily.    Historical Provider, MD  amLODipine (NORVASC) 10 MG tablet Take 10 mg by mouth 2 (two) times daily.     Historical Provider, MD  atorvastatin (LIPITOR) 40 MG tablet Take 1 tablet (40 mg total) by mouth daily at 6 PM. 09/02/14   Barton Dubois, MD  carvedilol (COREG) 25 MG tablet Take 25 mg by  mouth 2 (two) times daily with a meal.    Historical Provider, MD  cinacalcet (SENSIPAR) 30 MG tablet Take 30 mg by mouth every evening.     Historical Provider, MD  cloNIDine (CATAPRES - DOSED IN MG/24 HR) 0.3 mg/24hr patch Place 0.3 mg onto the skin once a week.    Historical Provider, MD  doxazosin (CARDURA) 2 MG tablet Take 2 mg by mouth daily.    Historical Provider, MD  famotidine (PEPCID) 20 MG tablet Take 1 tablet (20 mg total) by mouth 2 (two) times daily. 02/13/16   Charlesetta Shanks, MD  gabapentin (NEURONTIN) 100 MG capsule Take 1 capsule (100 mg total) by mouth at bedtime. Patient taking differently: Take 100 mg by mouth 3 (three) times daily.  05/21/16   [name], DO  hydrALAZINE (APRESOLINE) 100 MG tablet Take 100 mg by mouth 2 (two) times daily.     Historical Provider, MD  isosorbide mononitrate (IMDUR) 60 MG 24 hr tablet Take 1 tablet (60 mg total) by mouth daily. 01/09/16   Smiley Houseman, MD  methocarbamol (ROBAXIN) 500 MG tablet Take 1 tablet (500 mg total) by mouth 2 (two) times daily as needed for muscle spasms. 07/11/16   [name], MD  omeprazole (PRILOSEC) 20 MG capsule Take 20 mg by mouth daily. 07/01/13   Historical Provider, MD  ondansetron (ZOFRAN ODT) 4 MG disintegrating tablet Take 1 tablet (4 mg total) by mouth every 8 (eight) hours as needed for nausea or vomiting. 05/16/16   Sherwood Gambler, MD  oxycodone (OXY-IR) 5 MG capsule Take 1 capsule (5 mg total) by mouth every 4 (four) hours as needed for pain. 05/30/16   [name], MD  oxyCODONE-acetaminophen (PERCOCET/ROXICET) 5-325 MG tablet Take 1 tablet by mouth every 6 (six) hours as needed for severe pain.    Historical Provider, MD  predniSONE (DELTASONE) 10 MG tablet Take 10 mg by mouth daily with breakfast.    Historical Provider, MD  sevelamer carbonate (RENVELA) 800 MG tablet Take 1,600 mg by mouth 3 (three) times daily with meals.     Historical Provider, MD  warfarin (COUMADIN) 2 MG tablet Take 2  mg by mouth daily. Take with 44m tablet to equal 77m7/12/17   Historical Provider, MD  warfarin (COUMADIN) 5 MG tablet Take 5 mg by mouth See admin instructions. Take 1 tablet (5 mg) by mouth with a 2 mg tablet for a 7 mg dose 01/31/16   Historical Provider, MD    Social History   Social History  . Marital status: Married    Spouse name: N/A  . Number of children: 3  . Years of education: [employer]   Occupational History  . Not on file.   Social History Main Topics  . Smoking status: Former Smoker    Packs/day: 0.00    Years: 1.00    Types: Cigarettes  .  Smokeless tobacco: Never Used     Comment: quit Jan 2014  . Alcohol use No  . Drug use: No  . Sexual activity: Not Currently   Other Topics Concern  . Not on file   Social History Narrative   Owns own plumbing company     Family History  Problem Relation Age of Onset  . Hypertension Other     ROS: _0  Positive   _1  Negative   _2  All sytems reviewed and are negative  Cardiovascular: _3  chest pain/pressure _4  palpitations _5  SOB lying flat _6  DOE _7  pain in legs while walking _8  pain in legs at rest _9  pain in legs at night _10  non-healing ulcers _11  hx of DVT _12  swelling in legs  Pulmonary: _13  productive cough _14  asthma/wheezing _15  home O2  Neurologic: _16  weakness in _17  arms _18  legs _19  numbness in _20  arms _21  legs _22  hx of CVA _23  mini stroke _24 difficulty speaking or slurred speech _25  temporary loss of vision in one eye _26  dizziness  Hematologic: _27  hx of cancer _28  bleeding problems _29  problems with blood clotting easily  Endocrine:   _30  diabetes _31  thyroid disease  GI _32  vomiting  _33  blood in stool  GU: _34  CKD/renal failure _35  HD--_36  M/W/F or _37  T/T/S _38  burning with urination _39  blood in urine  Psychiatric: _40  anxiety _41  depression  Musculoskeletal: _42  arthritis _43  joint pain  Integumentary: _44  rashes _45  ulcers  Constitutional: _46  fever _47  chills   Physical  Examination  Vitals:   08/04/16 0630 08/04/16 0700  BP: (!) 181/102 (!) 188/113  Pulse: 84   Resp:    Temp:     There is no height or weight on file to calculate BMI.  General:  WDWN in NAD Gait: Not observed HENT: WNL, normocephalic Pulmonary: normal non-labored breathing Cardiac: palpable femoral and popliteal pulses bilaterally Abdomen: soft, patient will not let me press to determine area of ttp Skin: dry lower extremity skin Extremities: without ischemic changes, without Gangrene , without cellulitis; without open wounds;  Musculoskeletal: no muscle wasting or atrophy  Neurologic: A&O X 3; Appropriate Affect ; SENSATION: normal; MOTOR FUNCTION:  moving all extremities equally. Speech is fluent/normal Psychiatric:  Normal affect  CBC    Component Value Date/Time   WBC 5.5 08/04/2016 0232   RBC 3.08 (L) 08/04/2016 0232   HGB 8.5 (L) 08/04/2016 0232   HCT 26.9 (L) 08/04/2016 0232   PLT 214 08/04/2016 0232   MCV 87.3 08/04/2016 0232   MCH 27.6 08/04/2016 0232   MCHC 31.6 08/04/2016 0232   RDW 15.8 (H) 08/04/2016 0232   LYMPHSABS 1.4 08/04/2016 0232   MONOABS 0.5 08/04/2016 0232   EOSABS 0.3 08/04/2016 0232   BASOSABS 0.0 08/04/2016 0232    BMET    Component Value Date/Time   NA 141 08/04/2016 0232   K 5.1 08/04/2016 0232   CL 99 (L) 08/04/2016 0232   CO2 28 08/04/2016 0232   GLUCOSE 67 08/04/2016 0232   BUN 26 (H) 08/04/2016 0232   CREATININE 9.65 (H) 08/04/2016 0232   CALCIUM 8.2 (L) 08/04/2016 0232   GFRNONAA 5 (L) 08/04/2016 0232   GFRAA 6 (L) 08/04/2016 0232    COAGS: Lab Results  Component Value Date   INR 1.09 08/04/2016   INR 1.04 07/16/2016   INR 1.03 07/14/2016     Non-Invasive Vascular Imaging:     ASSESSMENT/PLAN: This is a 51 y.o. male with esrd here with severe abdominal pain radiating to  his back. I do not see vascular evidence of cause of pain but abnormality of innominate artery could be real in setting of severe hypertension.  Merits admission for bp control and will need f/u gated CT scan to re-evaluate innominate artery on this admission. Will also order carotid duplex.  Odie Rauen C. Donzetta Matters, MD [facility] Office: [phone] Pager: [phone]

## 2016-08-04 NOTE — ED Notes (Signed)
Patient transported to CT.

## 2016-08-04 NOTE — Progress Notes (Signed)
Patient is being transferred to ICU; patient has orders for drip to control blood pressure that cannot be administered on this floor per policy.  Pain medicine administered as ordered.

## 2016-08-04 NOTE — ED Provider Notes (Signed)
[facility] DEPT Provider Note   CSN: [id number] Arrival date & time: 08/03/16  2243     History   Chief Complaint Chief Complaint  Patient presents with  . Back Pain  . Abdominal Pain    HPI  Blood pressure (!) 199/122, pulse 82, temperature 98.9 F (37.2 C), temperature source Oral, resp. rate 17, SpO2 97 %.  Frank [name] is a 51 y.o. male with past medical history significant for ESRD, (dialyzed yesterday) CHF complaining of severe thoracic back pain with associated diffuse abdominal pain earlier today followed by nausea and vomiting. He denies any diarrhea, states that he is passing flatus normally. Denies fever, chills, chest pain, sick contacts. He states that this pain is difficult for from prior episodes of abdominal pain that he is had in the past. He does not make urine. No Pain meds PTA.   Past Medical History:  Diagnosis Date  . Anemia   . Anxiety   . Chronic combined systolic and diastolic CHF (congestive heart failure) (HCC)    a. EF 20-25% by echo in 08/2015 b. echo 10/2015: EF 35-40%, diffuse HK, severe LAE, moderate RAE, small pericardial effusion  . Complication of anesthesia    itching, sore throat  . Depression   . Dialysis patient ([place])   . ESRD (end stage renal disease) ([place])    due to HTN per patient, followed at [facility], s/p failed kidney transplant - dialysis Tue, Th, Sat  . Hyperkalemia 12/2015  . Hypertension   . Junctional rhythm    a. noted in 08/2015: hyperkalemic at that time  b. 12/2015: presented in junctional rhythm w/ K+ of 6.6. Resolved with improvement of K+ levels.  . Nonischemic cardiomyopathy ([place])    a. 08/2014: cath showing minimal CAD, but tortuous arteries noted.   . Personal history of DVT (deep vein thrombosis)/ PE 05/26/2016   In Oct 2015 had small subsemental LUL PE w/o DVT (LE dopplers neg) and was felt to be HD cath related, treated w coumadin.  IN May 2016 had small vein DVT (acute/subacute) in the R basilic/ brachial veins  of the RUE, resumed on coumadin.  Had R sided HD cath at that time.    . Renal insufficiency   . Shortness of breath   . Type II diabetes mellitus (HCC)    No history per patient, but remains under history as A1c would not be accurate given on dialysis    Patient Active Problem List   Diagnosis Date Noted  . End-stage renal disease on hemodialysis ([place])   . Subtherapeutic international normalized ratio (INR)   . Hypertension   . Problem with dialysis access ([place]) 06/22/2016  . ESRD (end stage renal disease) on dialysis ([place])   . ESRD (end stage renal disease) ([place]) 06/20/2016  . ESRD needing dialysis ([place]) 06/14/2016  . Uremia 06/07/2016  . Hyperkalemia 05/29/2016  . Chronic back pain 05/29/2016  . GERD (gastroesophageal reflux disease) 05/29/2016  . Personal history of DVT (deep vein thrombosis)/ PE 05/26/2016  . Bradycardia 03/31/2016  . Nonischemic cardiomyopathy ([name]) 01/09/2016  . Bilateral low back pain without sciatica   . Left renal mass 10/30/2015  . Constipation 10/30/2015  . Hypertensive urgency 09/08/2015  . Adjustment disorder with mixed anxiety and depressed mood 08/20/2015  . Chronic epididymitis 06/19/2015  . Groin pain, chronic, right 06/19/2015  . Incarcerated right inguinal hernia 02/16/2015  . Essential hypertension 01/02/2015  . Dyslipidemia   . ESRD on hemodialysis ([place])   . Anemia of  chronic kidney failure 06/24/2013    Past Surgical History:  Procedure Laterality Date  . CAPD INSERTION    . CAPD REMOVAL    . INGUINAL HERNIA REPAIR Right 02/14/2015   Procedure: REPAIR INCARCERATED RIGHT INGUINAL HERNIA;  Surgeon: [name], MD;  Location: [facility];  Service: General;  Laterality: Right;  . INSERTION OF DIALYSIS CATHETER Right 09/23/2015   Procedure: exchange of Right internal Dialysis Catheter.;  Surgeon: [name], MD;  Location: [facility];  Service: Vascular;  Laterality: Right;  . IR GENERIC HISTORICAL  07/16/2016   IR US GUIDE VASC ACCESS LEFT  07/16/2016 Corrie Mckusick, DO [facility]-INTERV RAD  . IR GENERIC HISTORICAL Left 07/16/2016   IR THROMBECTOMY AV FISTULA W/THROMBOLYSIS/PTA INC/SHUNT/IMG LEFT 07/16/2016 Corrie Mckusick, DO [facility]-INTERV RAD  . KIDNEY RECEIPIENT  2006   failed and started HD in March 2014  . LEFT HEART CATHETERIZATION WITH CORONARY ANGIOGRAM N/A 09/02/2014   Procedure: LEFT HEART CATHETERIZATION WITH CORONARY ANGIOGRAM;  Surgeon: [name], MD;  Location: [facility] CATH LAB;  Service: Cardiovascular;  Laterality: N/A;       Home Medications    Prior to Admission medications   Medication Sig Start Date End Date Taking? Authorizing Provider  allopurinol (ZYLOPRIM) 100 MG tablet Take 100 mg by mouth daily.    Historical Provider, MD  amLODipine (NORVASC) 10 MG tablet Take 10 mg by mouth 2 (two) times daily.     Historical Provider, MD  atorvastatin (LIPITOR) 40 MG tablet Take 1 tablet (40 mg total) by mouth daily at 6 PM. 09/02/14   Barton Dubois, MD  carvedilol (COREG) 25 MG tablet Take 25 mg by mouth 2 (two) times daily with a meal.    Historical Provider, MD  cinacalcet (SENSIPAR) 30 MG tablet Take 30 mg by mouth every evening.     Historical Provider, MD  cloNIDine (CATAPRES - DOSED IN MG/24 HR) 0.3 mg/24hr patch Place 0.3 mg onto the skin once a week.    Historical Provider, MD  doxazosin (CARDURA) 2 MG tablet Take 2 mg by mouth daily.    Historical Provider, MD  famotidine (PEPCID) 20 MG tablet Take 1 tablet (20 mg total) by mouth 2 (two) times daily. 02/13/16   Charlesetta Shanks, MD  gabapentin (NEURONTIN) 100 MG capsule Take 1 capsule (100 mg total) by mouth at bedtime. Patient taking differently: Take 100 mg by mouth 3 (three) times daily.  05/21/16   [name], DO  hydrALAZINE (APRESOLINE) 100 MG tablet Take 100 mg by mouth 2 (two) times daily.     Historical Provider, MD  isosorbide mononitrate (IMDUR) 60 MG 24 hr tablet Take 1 tablet (60 mg total) by mouth daily. 01/09/16   Smiley Houseman, MD  methocarbamol  (ROBAXIN) 500 MG tablet Take 1 tablet (500 mg total) by mouth 2 (two) times daily as needed for muscle spasms. 07/11/16   [name], MD  omeprazole (PRILOSEC) 20 MG capsule Take 20 mg by mouth daily. 07/01/13   Historical Provider, MD  ondansetron (ZOFRAN ODT) 4 MG disintegrating tablet Take 1 tablet (4 mg total) by mouth every 8 (eight) hours as needed for nausea or vomiting. 05/16/16   Sherwood Gambler, MD  oxycodone (OXY-IR) 5 MG capsule Take 1 capsule (5 mg total) by mouth every 4 (four) hours as needed for pain. 05/30/16   [name], MD  oxyCODONE-acetaminophen (PERCOCET/ROXICET) 5-325 MG tablet Take 1 tablet by mouth every 6 (six) hours as needed for severe pain.  Historical Provider, MD  predniSONE (DELTASONE) 10 MG tablet Take 10 mg by mouth daily with breakfast.    Historical Provider, MD  sevelamer carbonate (RENVELA) 800 MG tablet Take 1,600 mg by mouth 3 (three) times daily with meals.     Historical Provider, MD  warfarin (COUMADIN) 2 MG tablet Take 2 mg by mouth daily. Take with 15m tablet to equal 759m7/12/17   Historical Provider, MD  warfarin (COUMADIN) 5 MG tablet Take 5 mg by mouth See admin instructions. Take 1 tablet (5 mg) by mouth with a 2 mg tablet for a 7 mg dose 01/31/16   Historical Provider, MD    Family History Family History  Problem Relation Age of Onset  . Hypertension Other     Social History Social History  Substance Use Topics  . Smoking status: Former Smoker    Packs/day: 0.00    Years: 1.00    Types: Cigarettes  . Smokeless tobacco: Never Used     Comment: quit Jan 2014  . Alcohol use No     Allergies   Butalbital-apap-caffeine; Ferrlecit [na ferric gluc cplx in sucrose]; Minoxidil; Tylenol [acetaminophen]; and Darvocet [propoxyphene n-acetaminophen]   Review of Systems Review of Systems  10 systems reviewed and found to be negative, except as noted in the HPI.  Physical Exam Updated Vital Signs BP 175/96   Pulse 91   Temp 98.9 F  (37.2 C) (Oral)   Resp 17   SpO2 96%   Physical Exam  Constitutional: He is oriented to person, place, and time. He appears well-developed and well-nourished. No distress.  HENT:  Head: Normocephalic and atraumatic.  Mouth/Throat: Oropharynx is clear and moist.  Eyes: Conjunctivae and EOM are normal. Pupils are equal, round, and reactive to light.  Neck: Normal range of motion.  Cardiovascular: Normal rate, regular rhythm and intact distal pulses.   Fistula to left antecubital with good thrill and bruit  Pulmonary/Chest: Effort normal and breath sounds normal.  Abdominal: Soft. There is no tenderness.  Musculoskeletal: Normal range of motion.  Neurological: He is alert and oriented to person, place, and time.  Skin: He is not diaphoretic.  Psychiatric: He has a normal mood and affect.  Nursing note and vitals reviewed.    ED Treatments / Results  Labs (all labs ordered are listed, but only abnormal results are displayed) Labs Reviewed  CBC WITH DIFFERENTIAL/PLATELET - Abnormal; Notable for the following:       Result Value   RBC 3.08 (*)    Hemoglobin 8.5 (*)    HCT 26.9 (*)    RDW 15.8 (*)    All other components within normal limits  COMPREHENSIVE METABOLIC PANEL - Abnormal; Notable for the following:    Chloride 99 (*)    BUN 26 (*)    Creatinine, Ser 9.65 (*)    Calcium 8.2 (*)    Albumin 3.1 (*)    ALT 10 (*)    Alkaline Phosphatase 155 (*)    GFR calc non Af Amer 5 (*)    GFR calc Af Amer 6 (*)    All other components within normal limits  LIPASE, BLOOD - Abnormal; Notable for the following:    Lipase 83 (*)    All other components within normal limits  PROTIME-INR    EKG  EKG Interpretation None       Radiology Ct Angio Chest/abd/pel For Dissection W And/or Wo Contrast  Result Date: 08/04/2016 CLINICAL DATA:  Back pain and concern for  aortic dissection EXAM: CT ANGIOGRAPHY CHEST, ABDOMEN AND PELVIS TECHNIQUE: Multidetector CT imaging through the  chest, abdomen and pelvis was performed using the standard protocol during bolus administration of intravenous contrast. Multiplanar reconstructed images and MIPs were obtained and reviewed to evaluate the vascular anatomy. CONTRAST:  100 mL Isovue 370 IV COMPARISON:  None. FINDINGS: CTA CHEST FINDINGS Cardiovascular: The heart is enlarged. There is a small pericardial effusion measuring 6 mm. There is a normal variant aortic arch branching pattern with the brachiocephalic and left common carotid arteries sharing a common origin. Precontrast imaging shows no intramural hematoma of the thoracic aorta. There is aortic atherosclerosis. There is a small there is a small outpouching from the inferior aspect of the aortic arch just distal to the origin of the left subclavian artery. There is a linear opacity within the proximal brachiocephalic artery (coronal image 50). Mediastinum/Nodes: No mediastinal, hilar or axillary lymphadenopathy. The visualized thyroid and thoracic esophageal course are unremarkable. Lungs/Pleura: Lungs are clear. No pleural effusion or pneumothorax. Musculoskeletal: No chest wall abnormality. No acute or significant osseous findings. Review of the MIP images confirms the above findings. CTA ABDOMEN AND PELVIS FINDINGS VASCULAR Aorta: Normal caliber aorta without aneurysm, dissection, vasculitis or significant stenosis. Celiac: Patent without evidence of aneurysm, dissection, vasculitis or significant stenosis. SMA: Patent without evidence of aneurysm, dissection, vasculitis or significant stenosis. Renals: There is atherosclerotic narrowing at the origins of both renal arteries. IMA: Patent without evidence of aneurysm, dissection, vasculitis or significant stenosis. Inflow: There is a small focal dissection of the left common iliac artery. There is moderate to severe stenosis at the origin of the left internal iliac artery. There is extensive atherosclerotic calcification of the left internal  iliac artery. There is no aneurysm or severe stenosis of the right iliac arteries. Review of the MIP images confirms the above findings. NON-VASCULAR Hepatobiliary: No focal liver abnormality is seen. No gallstones, gallbladder wall thickening, or biliary dilatation. Pancreas: Unremarkable. No pancreatic ductal dilatation or surrounding inflammatory changes. Spleen: Normal in size without focal abnormality. Adrenals/Urinary Tract: The adrenal glands are normal. Both kidneys are severely atrophic with multiple renal cysts. The largest cyst measures 4.2 cm. Stomach/Bowel: No dilated small bowel or other evidence of obstruction. No enteric or colonic inflammation. Lymphatic: No abdominal or pelvic lymphadenopathy. Reproductive: Unremarkable prostate and seminal vesicles. Other: Soft tissue mass in the left iliac fossa is suspected to be a failed renal transplant. Musculoskeletal: There is no bony spinal canal stenosis. No lytic or blastic lesions. Review of the MIP images confirms the above findings. IMPRESSION: 1. No aortic dissection. 2. Linear defect along wall of the proximal brachiocephalic artery as it arises from the aortic arch. While this may be secondary to motion, no definite motion could be identified in adjacent structures. This could indicate a short segment dissection. A cardiac gated study might be helpful to determine if this is a true finding or motion artifact. 3. Focal outpouching from the undersurface of the aortic arch is in the typical location of the ductus diverticulum, which is the favored diagnosis. A small pseudo aneurysm or penetrating ulcer is considered less likely. 4. Small focal dissection within the proximal left common iliac artery. 5. Extensive aortic and iliac atherosclerosis. Severe narrowing of the left internal iliac artery secondary to calcified and noncalcified plaque. 6. Soft tissue mass in the left iliac fossa is likely a failed renal transplant. Electronically Signed   By:  Ulyses Jarred M.D.   On: 08/04/2016 05:22  Procedures Procedures (including critical care time)  Medications Ordered in ED Medications  amLODipine (NORVASC) tablet 10 mg (not administered)  hydrALAZINE (APRESOLINE) tablet 100 mg (not administered)  morphine 4 MG/ML injection 4 mg (4 mg Intravenous Given 08/04/16 0339)  ondansetron (ZOFRAN) injection 4 mg (4 mg Intravenous Given 08/04/16 0339)  morphine 4 MG/ML injection 4 mg (4 mg Intravenous Given 08/04/16 0507)  ondansetron (ZOFRAN) injection 4 mg (4 mg Intravenous Given 08/04/16 0507)     Initial Impression / Assessment and Plan / ED Course  I have reviewed the triage vital signs and the nursing notes.  Pertinent labs & imaging results that were available during my care of the patient were reviewed by me and considered in my medical decision making (see chart for details).  Clinical Course      Vitals:   08/04/16 0515 08/04/16 0530 08/04/16 0545 08/04/16 0600  BP: 164/97 169/92 173/95 175/96  Pulse: 87 89 88 91  Resp:      Temp:      TempSrc:      SpO2: 94% 96% 96% 96%    Medications  amLODipine (NORVASC) tablet 10 mg (not administered)  hydrALAZINE (APRESOLINE) tablet 100 mg (not administered)  morphine 4 MG/ML injection 4 mg (4 mg Intravenous Given 08/04/16 0339)  ondansetron (ZOFRAN) injection 4 mg (4 mg Intravenous Given 08/04/16 0339)  morphine 4 MG/ML injection 4 mg (4 mg Intravenous Given 08/04/16 0507)  ondansetron (ZOFRAN) injection 4 mg (4 mg Intravenous Given 08/04/16 0507)    RISHAN OYAMA is 51 y.o. male presenting with Dominant pain and thoracic back pain. Patient is well known to the ED, DEA database shows multiple prescriptions for narcotics from multiple providers. Abdominal exam with no focal pain however given his elevated blood pressure and thoracic back pain will rule out dissection. Blood work reassuring with no indication for emergent dialysis.  Dissection study with a linear defect along the wall of  the proximal brachiocephalic artery is arises from the aortic arch this could be motion artifact that it could be a short segment dissection. There is a small focal dissection within the proximal left common iliac artery.  Vascular surgery consult from [facility] appreciated: He will come to evaluate the patient.  Case signed out to PA [place] shift change: Is to follow-up vascular surgery recommendations and evaluate INR.  Final Clinical Impressions(s) / ED Diagnoses   Final diagnoses:  None    New Prescriptions New Prescriptions   No medications on file     Monico Blitz, PA-C 08/04/16 [place], DO 08/04/16 [phone]

## 2016-08-04 NOTE — Progress Notes (Signed)
Spoke with Dr. Jonnie Finner with Nephrology. Patient needs more aggressive BP management. Will need esmolol gtt. Per patient placement, patient needs to be in ICU. Spoke with [facility] physician. Transfer orders placed.    Dessa Phi, DO [facility] [email or web address] Password [facility] 08/04/2016, 5:14 PM

## 2016-08-04 NOTE — Progress Notes (Signed)
ANTICOAGULATION CONSULT NOTE - Initial Consult  Pharmacy Consult for Coumadin Indication: h/o VTE  Allergies  Allergen Reactions  . Butalbital-Apap-Caffeine Shortness Of Breath, Swelling and Other (See Comments)    Swelling in throat  . Ferrlecit [Na Ferric Gluc Cplx In Sucrose] Shortness Of Breath, Swelling and Other (See Comments)    Swelling in throat  . Minoxidil Shortness Of Breath  . Tylenol [Acetaminophen] Anaphylaxis and Swelling  . Darvocet [Propoxyphene N-Acetaminophen] Hives    Vital Signs: Temp: 98.9 F (37.2 C) (01/13 2256) Temp Source: Oral (01/13 2256) BP: 188/113 (01/14 0700) Pulse Rate: 84 (01/14 0630)  Labs:  Recent Labs  08/04/16 0232 08/04/16 0638  HGB 8.5*  --   HCT 26.9*  --   PLT 214  --   LABPROT  --  14.1  INR  --  1.09  CREATININE 9.65*  --     Estimated Creatinine Clearance: 9.5 mL/min (by C-G formula based on SCr of 9.65 mg/dL (H)).   Medical History: Past Medical History:  Diagnosis Date  . Anemia   . Anxiety   . Chronic combined systolic and diastolic CHF (congestive heart failure) (HCC)    a. EF 20-25% by echo in 08/2015 b. echo 10/2015: EF 35-40%, diffuse HK, severe LAE, moderate RAE, small pericardial effusion  . Complication of anesthesia    itching, sore throat  . Depression   . Dialysis patient ([place])   . ESRD (end stage renal disease) ([place])    due to HTN per patient, followed at [facility], s/p failed kidney transplant - dialysis Tue, Th, Sat  . Hyperkalemia 12/2015  . Hypertension   . Junctional rhythm    a. noted in 08/2015: hyperkalemic at that time  b. 12/2015: presented in junctional rhythm w/ K+ of 6.6. Resolved with improvement of K+ levels.  . Nonischemic cardiomyopathy ([place])    a. 08/2014: cath showing minimal CAD, but tortuous arteries noted.   . Personal history of DVT (deep vein thrombosis)/ PE 05/26/2016   In Oct 2015 had small subsemental LUL PE w/o DVT (LE dopplers neg) and was felt to be HD cath related,  treated w coumadin.  IN May 2016 had small vein DVT (acute/subacute) in the R basilic/ brachial veins of the RUE, resumed on coumadin.  Had R sided HD cath at that time.    . Renal insufficiency   . Shortness of breath   . Type II diabetes mellitus (HCC)    No history per patient, but remains under history as A1c would not be accurate given on dialysis    Assessment: 51yo male w/ multiple recent admissions c/o thoracic back pain, CT concerning for short segment dissection, awaiting surgical consult, to continue Coumadin for h/o VTE; current INR below goal, plan to hold off on Coumadin until surgical plan clear.  Goal of Therapy:  INR 2-3   Plan:  Will f/u after surgical consult to determine whether to resume Coumadin.  Wynona Neat, PharmD, BCPS  08/04/2016,7:55 AM

## 2016-08-05 ENCOUNTER — Observation Stay (HOSPITAL_COMMUNITY): Payer: Medicare Other

## 2016-08-05 DIAGNOSIS — R451 Restlessness and agitation: Secondary | ICD-10-CM | POA: Diagnosis not present

## 2016-08-05 DIAGNOSIS — G8929 Other chronic pain: Secondary | ICD-10-CM | POA: Diagnosis present

## 2016-08-05 DIAGNOSIS — Z79899 Other long term (current) drug therapy: Secondary | ICD-10-CM | POA: Diagnosis not present

## 2016-08-05 DIAGNOSIS — Z86718 Personal history of other venous thrombosis and embolism: Secondary | ICD-10-CM | POA: Diagnosis not present

## 2016-08-05 DIAGNOSIS — K5900 Constipation, unspecified: Secondary | ICD-10-CM | POA: Diagnosis present

## 2016-08-05 DIAGNOSIS — F419 Anxiety disorder, unspecified: Secondary | ICD-10-CM | POA: Diagnosis present

## 2016-08-05 DIAGNOSIS — I700 Atherosclerosis of aorta: Secondary | ICD-10-CM | POA: Diagnosis not present

## 2016-08-05 DIAGNOSIS — R109 Unspecified abdominal pain: Secondary | ICD-10-CM | POA: Diagnosis not present

## 2016-08-05 DIAGNOSIS — I169 Hypertensive crisis, unspecified: Secondary | ICD-10-CM | POA: Diagnosis not present

## 2016-08-05 DIAGNOSIS — F17210 Nicotine dependence, cigarettes, uncomplicated: Secondary | ICD-10-CM | POA: Diagnosis present

## 2016-08-05 DIAGNOSIS — G4733 Obstructive sleep apnea (adult) (pediatric): Secondary | ICD-10-CM | POA: Diagnosis present

## 2016-08-05 DIAGNOSIS — D631 Anemia in chronic kidney disease: Secondary | ICD-10-CM | POA: Diagnosis not present

## 2016-08-05 DIAGNOSIS — N2581 Secondary hyperparathyroidism of renal origin: Secondary | ICD-10-CM | POA: Diagnosis present

## 2016-08-05 DIAGNOSIS — I161 Hypertensive emergency: Secondary | ICD-10-CM | POA: Diagnosis not present

## 2016-08-05 DIAGNOSIS — I428 Other cardiomyopathies: Secondary | ICD-10-CM

## 2016-08-05 DIAGNOSIS — N186 End stage renal disease: Secondary | ICD-10-CM | POA: Diagnosis not present

## 2016-08-05 DIAGNOSIS — F329 Major depressive disorder, single episode, unspecified: Secondary | ICD-10-CM | POA: Diagnosis present

## 2016-08-05 DIAGNOSIS — Z992 Dependence on renal dialysis: Secondary | ICD-10-CM | POA: Diagnosis not present

## 2016-08-05 DIAGNOSIS — Z940 Kidney transplant status: Secondary | ICD-10-CM | POA: Diagnosis not present

## 2016-08-05 DIAGNOSIS — E785 Hyperlipidemia, unspecified: Secondary | ICD-10-CM | POA: Diagnosis present

## 2016-08-05 DIAGNOSIS — I132 Hypertensive heart and chronic kidney disease with heart failure and with stage 5 chronic kidney disease, or end stage renal disease: Secondary | ICD-10-CM | POA: Diagnosis present

## 2016-08-05 DIAGNOSIS — M545 Low back pain: Secondary | ICD-10-CM | POA: Diagnosis present

## 2016-08-05 DIAGNOSIS — E875 Hyperkalemia: Secondary | ICD-10-CM | POA: Diagnosis present

## 2016-08-05 DIAGNOSIS — I7772 Dissection of iliac artery: Secondary | ICD-10-CM | POA: Diagnosis present

## 2016-08-05 DIAGNOSIS — E1122 Type 2 diabetes mellitus with diabetic chronic kidney disease: Secondary | ICD-10-CM | POA: Diagnosis present

## 2016-08-05 DIAGNOSIS — I5042 Chronic combined systolic (congestive) and diastolic (congestive) heart failure: Secondary | ICD-10-CM | POA: Diagnosis present

## 2016-08-05 DIAGNOSIS — I7779 Dissection of other artery: Secondary | ICD-10-CM | POA: Diagnosis not present

## 2016-08-05 DIAGNOSIS — Z7901 Long term (current) use of anticoagulants: Secondary | ICD-10-CM | POA: Diagnosis not present

## 2016-08-05 DIAGNOSIS — R112 Nausea with vomiting, unspecified: Secondary | ICD-10-CM | POA: Diagnosis not present

## 2016-08-05 LAB — COMPREHENSIVE METABOLIC PANEL
ALK PHOS: 132 U/L — AB (ref 38–126)
ALT: 6 U/L — AB (ref 17–63)
AST: 25 U/L (ref 15–41)
Albumin: 2.5 g/dL — ABNORMAL LOW (ref 3.5–5.0)
Anion gap: 12 (ref 5–15)
BUN: 38 mg/dL — ABNORMAL HIGH (ref 6–20)
CALCIUM: 8 mg/dL — AB (ref 8.9–10.3)
CO2: 27 mmol/L (ref 22–32)
CREATININE: 12.86 mg/dL — AB (ref 0.61–1.24)
Chloride: 98 mmol/L — ABNORMAL LOW (ref 101–111)
GFR calc non Af Amer: 4 mL/min — ABNORMAL LOW (ref >60)
GFR, EST AFRICAN AMERICAN: 4 mL/min — AB (ref >60)
Glucose, Bld: 69 mg/dL (ref 65–99)
Potassium: >7.5 mmol/L (ref 3.5–5.1)
SODIUM: 137 mmol/L (ref 135–145)
Total Bilirubin: 0.9 mg/dL (ref 0.3–1.2)
Total Protein: 6.8 g/dL (ref 6.5–8.1)

## 2016-08-05 LAB — CBC WITH DIFFERENTIAL/PLATELET
BASOS PCT: 0 %
Basophils Absolute: 0 10*3/uL (ref 0.0–0.1)
EOS ABS: 0.3 10*3/uL (ref 0.0–0.7)
Eosinophils Relative: 4 %
HCT: 23.8 % — ABNORMAL LOW (ref 39.0–52.0)
Hemoglobin: 7.5 g/dL — ABNORMAL LOW (ref 13.0–17.0)
Lymphocytes Relative: 17 %
Lymphs Abs: 1.2 10*3/uL (ref 0.7–4.0)
MCH: 27.4 pg (ref 26.0–34.0)
MCHC: 31.5 g/dL (ref 30.0–36.0)
MCV: 86.9 fL (ref 78.0–100.0)
MONO ABS: 0.5 10*3/uL (ref 0.1–1.0)
MONOS PCT: 8 %
NEUTROS ABS: 4.8 10*3/uL (ref 1.7–7.7)
Neutrophils Relative %: 71 %
Platelets: 238 10*3/uL (ref 150–400)
RBC: 2.74 MIL/uL — ABNORMAL LOW (ref 4.22–5.81)
RDW: 15.8 % — AB (ref 11.5–15.5)
WBC: 6.7 10*3/uL (ref 4.0–10.5)

## 2016-08-05 LAB — GLUCOSE, CAPILLARY
GLUCOSE-CAPILLARY: 74 mg/dL (ref 65–99)
GLUCOSE-CAPILLARY: 94 mg/dL (ref 65–99)
Glucose-Capillary: 66 mg/dL (ref 65–99)

## 2016-08-05 LAB — VAS US CAROTID
LCCADSYS: -99 cm/s
LCCAPDIAS: 22 cm/s
LCCAPSYS: 108 cm/s
LEFT ECA DIAS: 10 cm/s
LEFT VERTEBRAL DIAS: 19 cm/s
Left CCA dist dias: -26 cm/s
Left ICA dist dias: -18 cm/s
Left ICA dist sys: -49 cm/s
Left ICA prox dias: -23 cm/s
Left ICA prox sys: -68 cm/s
RCCADSYS: -67 cm/s
RCCAPDIAS: 16 cm/s
RCCAPSYS: 112 cm/s
RIGHT ECA DIAS: -12 cm/s
RIGHT VERTEBRAL DIAS: 26 cm/s

## 2016-08-05 LAB — TROPONIN I
Troponin I: 0.04 ng/mL (ref <0.03)
Troponin I: 0.04 ng/mL (ref <0.03)

## 2016-08-05 LAB — RENAL FUNCTION PANEL
ANION GAP: 13 (ref 5–15)
Albumin: 2.6 g/dL — ABNORMAL LOW (ref 3.5–5.0)
BUN: 40 mg/dL — ABNORMAL HIGH (ref 6–20)
CALCIUM: 7.8 mg/dL — AB (ref 8.9–10.3)
CO2: 24 mmol/L (ref 22–32)
Chloride: 97 mmol/L — ABNORMAL LOW (ref 101–111)
Creatinine, Ser: 13.65 mg/dL — ABNORMAL HIGH (ref 0.61–1.24)
GFR calc non Af Amer: 4 mL/min — ABNORMAL LOW (ref >60)
GFR, EST AFRICAN AMERICAN: 4 mL/min — AB (ref >60)
GLUCOSE: 68 mg/dL (ref 65–99)
Phosphorus: 5.2 mg/dL — ABNORMAL HIGH (ref 2.5–4.6)
Potassium: >7.5 mmol/L (ref 3.5–5.1)
SODIUM: 134 mmol/L — AB (ref 135–145)

## 2016-08-05 LAB — HEMOGLOBIN A1C
HEMOGLOBIN A1C: 5.5 % (ref 4.8–5.6)
MEAN PLASMA GLUCOSE: 111 mg/dL

## 2016-08-05 LAB — PROTIME-INR
INR: 1.12
Prothrombin Time: 14.5 seconds (ref 11.4–15.2)

## 2016-08-05 LAB — PHOSPHORUS: PHOSPHORUS: 5.1 mg/dL — AB (ref 2.5–4.6)

## 2016-08-05 LAB — MAGNESIUM: MAGNESIUM: 1.9 mg/dL (ref 1.7–2.4)

## 2016-08-05 MED ORDER — PROMETHAZINE HCL 25 MG/ML IJ SOLN
25.0000 mg | Freq: Once | INTRAMUSCULAR | Status: AC
  Administered 2016-08-05: 25 mg via INTRAVENOUS
  Filled 2016-08-05: qty 1

## 2016-08-05 MED ORDER — ATROPINE SULFATE 1 MG/10ML IJ SOSY
PREFILLED_SYRINGE | INTRAMUSCULAR | Status: AC
  Filled 2016-08-05: qty 10

## 2016-08-05 MED ORDER — ONDANSETRON HCL 4 MG/2ML IJ SOLN
4.0000 mg | Freq: Four times a day (QID) | INTRAMUSCULAR | Status: DC | PRN
  Administered 2016-08-05 (×2): 4 mg via INTRAVENOUS
  Filled 2016-08-05: qty 2

## 2016-08-05 MED ORDER — ONDANSETRON HCL 4 MG/2ML IJ SOLN
INTRAMUSCULAR | Status: AC
  Filled 2016-08-05: qty 2

## 2016-08-05 NOTE — Progress Notes (Signed)
PCCM INTERVAL PROGRESS NOTE  51 year old male with ESRD and mixed CHF admitted with possible aortic dissection in the setting of hypertensive urgency. He was maintained on a esmolol gtt for hypertension and continued on home amlodipine, carvedilol, clonidine, isosorbide, and hydralazine. 1/15 he was able to come off esmolol with SBP 100-110, however, shortly after he developed some bradycardia, which has remained intermittent and asymptomatic. Rates dropping into 30s and 40s for a few seconds at a time and then returning into the 60s-70s. Appears sinus on telemetry. Hopefully this is a reflection of the medications he has received or an electrolyte imbalance.   Plan: EKG STAT Assess electrolytes (he refused this AM, but is willing to be stuck for labs now) Atropine at bedside As long as he remains asymptomatic, reasonable to closely monitor for now.  Georgann Housekeeper, AGACNP-BC [facility] Pager [phone] or [phone]  08/05/2016 2:58 PM

## 2016-08-05 NOTE — Progress Notes (Signed)
Potassium recheck at >7.5, drawn prior to start of HD treatment.    Nephrology aware, will continue to monitor.

## 2016-08-05 NOTE — Progress Notes (Signed)
PULMONARY / CRITICAL CARE MEDICINE   Name: Frank [name] MRN: [id number] DOB: February 07, 1966    ADMISSION DATE:  08/04/2016 CONSULTATION DATE:  08/04/2016  REFERRING MD:  Shon Millet DO  CHIEF COMPLAINT:  Hypertensive Emergency  HISTORY OF PRESENT ILLNESS:   Frank [name] is a 51 year old gentleman with history of ESRD on dialysis, chronic combined systolic and diastolic CHF, hypertension, DVT on Coumadin and nonischemic cardiomyopathy who presented with abdominal pain with nausea and vomiting.  A CTA abdomen was obtained to rule out dissection and showed two areas which could be dissection at the origin of the L brachiocephalic artery and at the proximal L common iliac artery.   Patient's blood pressures have been elevated during admission despite oral antihypertensive medications. PCCM was consulted for better management of blood pressure.  SUBJECTIVE: No acute events since last seen. Labs have not been done yet this morning. Patient denies any nausea or vomiting. Denies any frank abdominal pain but does report a slightly abnormal sensation. Denies any chest pain or pressure. Remains on as well all drip.   REVIEW OF SYSTEMS:  No subjective fever or chills. No headache or vision changes.  VITAL SIGNS: BP (!) 134/91 (BP Location: Right Arm)   Pulse 73   Temp 98.1 F (36.7 C) (Axillary)   Resp 16   Ht 6' 2" (1.88 m)   Wt 167 lb 12.3 oz (76.1 kg)   SpO2 92%   BMI 21.54 kg/m   HEMODYNAMICS:    VENTILATOR SETTINGS:    INTAKE / OUTPUT: I/O last 3 completed shifts: In: 482.2 [P.O.:480; I.V.:2.2] Out: 0   PHYSICAL EXAMINATION: General:  Awake. No distress. Sitting up in the bed with physical therapy at bedside.  Integument:  Warm & dry. No rash on exposed skin.  Extremities:  No cyanosis or clubbing.  HEENT:  Moist mucus membranes.No scleral injection or icterus. Pupils symmetric. Cardiovascular:  Regular rate. No edema. Normal S1 & S2. Pulmonary:   Speaking in  complete sentences with normal work of breathing on room air. Overall clear to auscultation. Abdomen: Soft. Normal bowel sounds. Nontender. Musculoskeletal:  Normal bulk and tone. No joint deformity or effusion appreciated.  Neurological: Cranial nerves intact grossly. No meningismus. Oriented times person, place, time, and situation.  LABS:  BMET  Recent Labs Lab 08/04/16 0232  NA 141  K 5.1  CL 99*  CO2 28  BUN 26*  CREATININE 9.65*  GLUCOSE 67    Electrolytes  Recent Labs Lab 08/04/16 0232  CALCIUM 8.2*    CBC  Recent Labs Lab 08/04/16 0232  WBC 5.5  HGB 8.5*  HCT 26.9*  PLT 214    Coag's  Recent Labs Lab 08/04/16 0638  INR 1.09    Sepsis Markers No results for input(s): LATICACIDVEN, PROCALCITON, O2SATVEN in the last 168 hours.  ABG No results for input(s): PHART, PCO2ART, PO2ART in the last 168 hours.  Liver Enzymes  Recent Labs Lab 08/04/16 0232  AST 20  ALT 10*  ALKPHOS 155*  BILITOT 0.4  ALBUMIN 3.1*    Cardiac Enzymes No results for input(s): TROPONINI, PROBNP in the last 168 hours.  Glucose  Recent Labs Lab 07/29/16 0611 08/04/16 1646 08/04/16 2130  GLUCAP 111* 73 88    Imaging No results found.   STUDIES:  CTA CHEST/ABD/PELVIS (08/04/16): IMPRESSION: 1. No aortic dissection. 2. Linear defect along wall of the proximal brachiocephalic artery as it arises from the aortic arch. While this may be secondary to motion, no  definite motion could be identified in adjacent structures. This could indicate a short segment dissection. A cardiac gated study might be helpful to determine if this is a true finding or motion artifact. 3. Focal outpouching from the undersurface of the aortic arch is in the typical location of the ductus diverticulum, which is the favored diagnosis. A small pseudo aneurysm or penetrating ulcer is considered less likely. 4. Small focal dissection within the proximal left common iliac artery. 5.  Extensive aortic and iliac atherosclerosis. Severe narrowing of the left internal iliac artery secondary to calcified and noncalcified plaque. 6. Soft tissue mass in the left iliac fossa is likely a failed renal transplant.  MICROBIOLOGY: MRSA PCR 1/14: Positive  ANTIBIOTICS: None  SIGNIFICANT EVENTS: 1/14 - Admit by [facility] for hypertensive emergency with abdominal symptoms 1/14 - PCCM consult for uncontrolled hypertension  LINES/TUBES: PIV x1  ASSESSMENT / PLAN:  CARDIOVASCULAR A:  Hypertensive Emergency Left Common Iliac Artery Dissection Chronic Combined Systolic and Diastolic Heart Failure - 0/9030 LV EF 35-40% Nonischemic Cardiomyopathy   P:  Vitals per unit protocol Continuous telemetry monitoring  Continuing home Norvasc, Coreg bid, Cardura, Hydralazine, Imdur, &Catapres Patcholol  Esmolol gtt titrated to goal SBP Vascular Surgery Consulted & following Carotid Duplex pending Plan for Gated CT Neck & Chest to evaluate innominate artery this admission  PULMONARY A: OSA - On home CPAP.  P:   Continuous pulse oximetry Someone to bring in home CPAP  RENAL A:   ESRD on Hemodialysis   P:   Nephrology Consulted Planned for HD today Trending electrolytes daily  GASTROINTESTINAL A:   Abdominal Pain - Unclear etiology. Improving.  P:   Carbohydrate controlled renal diet as tolerated Protonix PO daily in lieu of Prilosec Oxycocdone, Toradol, & Dilaudid IV prn Zofran IV prn   HEMATOLOGIC A:   Anemia - Secondary to chronic disease. No signs of active bleeding. H/O PE - 2015. Notes indicated 6 months of anticoagulation planned.  P:  ESA with dialysis  Monitoring cell counts daily w/ CBC SCDs Heparin [place] q8hr  INFECTIOUS A:   No acute infection.  P:   Monitor for signs/symptoms of infection  ENDOCRINE A:   H/O DM Type 2 Chronic Prednisone Use - Stopped a week ago.  P:   Accu-Checks qAC & HS SSI per Sensitive Algorithm with  Meals Leaving off Prednisone for now  NEUROLOGIC A:   H/O Depression  P:   Continue to monitor Not currently on medication  FAMILY  - Updates: No family at bedside. Patient updated by Dr. Ashok Cordia on 1/15.   - Inter-disciplinary family meet or Palliative Care meeting due by:  08/11/16   TODAY'S SUMMARY:  51 y.o. male with known end-stage renal disease admitted with hypertensive emergency. Still requiring continuous titration esmolol drip for blood pressure control. Vascular ultrasound pending for evaluation in the setting of arterial dissection. Overall neurological status remained stable. Given continued titration of antihypertensive medication he'll remain in the intensive care unit. Appreciate assistance from vascular surgery.  I have spent a total of 36 minutes of critical care time caring for the patient and reviewing the patient's electronic medical record.   Sonia Baller Ashok Cordia, M.D. [facility] Pulmonary & Critical Care Pager:  [phone] After 3pm or if no response, call [phone] 08/05/2016, 5:55 AM

## 2016-08-05 NOTE — Progress Notes (Signed)
Pt oxygen saturation 87 on room air while sleeping applied 2L [place]. Pt removed [place] stating " it bothers me". Attempted to reapply SCDs pt refused. Pt constantly restless in bed. Pt is stating he is not able to sleep due to constant intervention by nursing staff.   Pt on esmolol gtt ]  POC HD today

## 2016-08-05 NOTE — Progress Notes (Signed)
Called [facility] with critical K >7.5 and troponin 0.04.  Hemodialysis at bedside

## 2016-08-05 NOTE — Progress Notes (Signed)
Arrived to patient room 2S-16 at 1555.  Reviewed treatment plan and this RN agrees.  Report received from bedside RN, Nira Conn.  Consent obtained.  Patient A & O X 4. Lung sounds clear to ausculation in all fields. No edema. Cardiac: NSR to SB, irregular with ectopy.  Prepped LLAVF with alcohol and cannulated with two 16 gauge needles.  Pulsation of blood noted.  Flushed access well with saline per protocol.  Connected and secured lines and initiated tx at 1630.  UF goal of 4500 mL and net fluid removal of 4000 mL.    Nephrology notified of K >7.5 prior to treatment.  Verbal order received to run 0K bath for 15 minutes, then 1K bath for remainder of treatment.  RPF drawn prior to initializing treatment, will adjust acid bath per parameters of HD standing orders.    Will continue to monitor.

## 2016-08-05 NOTE — Progress Notes (Signed)
*  PRELIMINARY RESULTS* Vascular Ultrasound Carotid Duplex (Doppler) has been completed.  Preliminary findings: Bilateral: No significant (1-39%) ICA stenosis. Antegrade vertebral flow.      Landry Mellow, RDMS, RVT  08/05/2016, 11:50 AM

## 2016-08-05 NOTE — Progress Notes (Signed)
Dialysis treatment completed.  4500 mL ultrafiltrated and net fluid removal 4000 mL.    Patient status unchanged. Lung sounds clear to ausculation in all fields. No edema. Cardiac: NSR.  Disconnected lines and removed needles.  Pressure held for 10 minutes and band aid/gauze dressing applied.  Report given to bedside RN, Gwyndolyn Saxon.

## 2016-08-05 NOTE — Evaluation (Signed)
Occupational Therapy Evaluation Patient Details Name: Frank [name] MRN: [id number] DOB: 04-28-1966 Today's Date: 08/05/2016    History of Present Illness 51 y.o. male with a Past Medical History of ESRD, NICM, HTN, history of DVT on coumadin therapy, diabetes who presents with abdominal pain with radiation to his back, nausea, vomiting. CTA abdomen obtained to r/o dissection, found linear defect along wall of the proximal brachiocephalic artery as it arises from the aortic arch.   Clinical Impression   Pt reports he was independent with ADL PTA. Currently pt overall min assist for ADL and functional mobility. Pt impulsive with transfers and non compliant with safety education provided by therapist. Pt presenting with abdominal pain, fatigue, and deconditioning impacting his independence and safety with ADL and functional mobility. Pt planning to d/c home alone. Recommending HHOT for follow up to maximize independence and safety with ADL and functional mobility upon return home. Pt may progress to home without OT f/u. Pt would benefit from continued skilled OT to address established goals.    Follow Up Recommendations  Home health OT;Supervision/Assistance - 24 hour    Equipment Recommendations  Tub/shower seat    Recommendations for Other Services       Precautions / Restrictions Precautions Precautions: Fall Restrictions Weight Bearing Restrictions: No      Mobility Bed Mobility Overal bed mobility: Needs Assistance Bed Mobility: Supine to Sit     Supine to sit: Min assist     General bed mobility comments: Min hand held assist to elevate trunk to sitting.  Transfers Overall transfer level: Needs assistance Equipment used: None Transfers: Sit to/from Stand Sit to Stand: Min guard         General transfer comment: Pt quick to move, transfers prior to therapist being prepared. Unsteady on feet.    Balance Overall balance assessment: Needs  assistance Sitting-balance support: Feet supported;No upper extremity supported Sitting balance-Leahy Scale: Good     Standing balance support: No upper extremity supported;During functional activity Standing balance-Leahy Scale: Poor                              ADL Overall ADL's : Needs assistance/impaired Eating/Feeding: Set up;Sitting   Grooming: Minimal assistance;Standing   Upper Body Bathing: Set up;Supervision/ safety;Sitting   Lower Body Bathing: Minimal assistance;Sit to/from stand   Upper Body Dressing : Set up;Supervision/safety;Sitting   Lower Body Dressing: Minimal assistance;Sit to/from stand Lower Body Dressing Details (indicate cue type and reason): Able to don sock sitting EOB with supervision. Min assist for balance with sit to stand. Toilet Transfer: Minimal assistance;Ambulation;Regular Toilet           Functional mobility during ADLs: Minimal assistance General ADL Comments: Pt quick to fatigue with balance deficits during functional mobility. Pt impulsive, moves quickly, and does not follow instructions given by therapist.      Vision Vision Assessment?: No apparent visual deficits   Perception     Praxis      Pertinent Vitals/Pain Pain Assessment: Faces Faces Pain Scale: Hurts even more Pain Location: abdomen Pain Descriptors / Indicators: Discomfort;Sore Pain Intervention(s): Monitored during session;Repositioned     Hand Dominance     Extremity/Trunk Assessment Upper Extremity Assessment Upper Extremity Assessment: Overall WFL for tasks assessed   Lower Extremity Assessment Lower Extremity Assessment: Defer to PT evaluation   Cervical / Trunk Assessment Cervical / Trunk Assessment: Normal   Communication Communication Communication: No difficulties   Cognition Arousal/Alertness:  Awake/alert Behavior During Therapy: Impulsive Overall Cognitive Status: Within Functional Limits for tasks assessed                      General Comments       Exercises       Shoulder Instructions      Home Living Family/patient expects to be discharged to:: Private residence Living Arrangements: Alone Available Help at Discharge: Family;Friend(s);Available PRN/intermittently Type of Home: Apartment Home Access: Level entry     Home Layout: One level     Bathroom Shower/Tub: Tub/shower unit Shower/tub characteristics: Architectural technologist: Handicapped height     Home Equipment: None          Prior Functioning/Environment Level of Independence: Independent        Comments: independent with ADL, drives        OT Problem List: Decreased strength;Decreased activity tolerance;Impaired balance (sitting and/or standing);Decreased safety awareness;Decreased knowledge of use of DME or AE;Decreased knowledge of precautions;Pain   OT Treatment/Interventions: Self-care/ADL training;Energy conservation;DME and/or AE instruction;Therapeutic activities;Patient/family education;Balance training    OT Goals(Current goals can be found in the care plan section) Acute Rehab OT Goals Patient Stated Goal: get something to eat OT Goal Formulation: With patient Time For Goal Achievement: 08/19/16 Potential to Achieve Goals: Good ADL Goals Pt Will Perform Grooming: with modified independence;standing Pt Will Perform Lower Body Bathing: with modified independence;sit to/from stand (and gather ADL items) Pt Will Perform Lower Body Dressing: with modified independence;sit to/from stand (and gather ADL items) Pt Will Transfer to Toilet: with modified independence;ambulating;regular height toilet Pt Will Perform Toileting - Clothing Manipulation and hygiene: with modified independence;sit to/from stand Pt Will Perform Tub/Shower Transfer: Tub transfer;with modified independence;ambulating;shower seat  OT Frequency: Min 2X/week   Barriers to D/C: Decreased caregiver support  pt lives alone        Co-evaluation PT/OT/SLP Co-Evaluation/Treatment: Yes Reason for Co-Treatment: Complexity of the patient's impairments (multi-system involvement);For patient/therapist safety   OT goals addressed during session: ADL's and self-care      End of Session Equipment Utilized During Treatment: Gait belt;Oxygen Nurse Communication: Mobility status  Activity Tolerance: Patient tolerated treatment well Patient left: in bed;with call bell/phone within reach;with bed alarm set   Time: 1540-0867 OT Time Calculation (min): 29 min Charges:  OT General Charges $OT Visit: 1 Procedure OT Evaluation $OT Eval Moderate Complexity: 1 Procedure G-Codes: OT G-codes **NOT FOR INPATIENT CLASS** Functional Assessment Tool Used: Clinical judgement Functional Limitation: Self care Self Care Current Status (Y1950): At least 1 percent but less than 20 percent impaired, limited or restricted Self Care Goal Status (D3267): At least 1 percent but less than 20 percent impaired, limited or restricted   Binnie Kand M.S., OTR/L Pager: [phone]  08/05/2016, 9:34 AM

## 2016-08-05 NOTE — Plan of Care (Signed)
Problem: Physical Regulation: Goal: Ability to maintain clinical measurements within normal limits will improve Outcome: Progressing Pt is on esmolol gtt for BP management, PO BP meds restarted today, monitoring closely

## 2016-08-05 NOTE — Progress Notes (Signed)
  Progress Note    08/05/2016 7:39 AM * No surgery found *  Subjective:  Agitated overnight  Vitals:   08/05/16 0700 08/05/16 0735  BP: (!) 184/96   Pulse: 83   Resp: 16   Temp:  98 F (36.7 C)    Physical Exam: Alert and oriented this a.m Palpable, radial, femoral and popliteal pulses Abdomen is soft, guarding Moving all 4 extremities  CBC    Component Value Date/Time   WBC 5.5 08/04/2016 0232   RBC 3.08 (L) 08/04/2016 0232   HGB 8.5 (L) 08/04/2016 0232   HCT 26.9 (L) 08/04/2016 0232   PLT 214 08/04/2016 0232   MCV 87.3 08/04/2016 0232   MCH 27.6 08/04/2016 0232   MCHC 31.6 08/04/2016 0232   RDW 15.8 (H) 08/04/2016 0232   LYMPHSABS 1.4 08/04/2016 0232   MONOABS 0.5 08/04/2016 0232   EOSABS 0.3 08/04/2016 0232   BASOSABS 0.0 08/04/2016 0232    BMET    Component Value Date/Time   NA 141 08/04/2016 0232   K 5.1 08/04/2016 0232   CL 99 (L) 08/04/2016 0232   CO2 28 08/04/2016 0232   GLUCOSE 67 08/04/2016 0232   BUN 26 (H) 08/04/2016 0232   CREATININE 9.65 (H) 08/04/2016 0232   CALCIUM 8.2 (L) 08/04/2016 0232   GFRNONAA 5 (L) 08/04/2016 0232   GFRAA 6 (L) 08/04/2016 0232    INR    Component Value Date/Time   INR 1.09 08/04/2016 0638     Intake/Output Summary (Last 24 hours) at 08/05/16 0739 Last data filed at 08/05/16 0700  Gross per 24 hour  Intake           967.56 ml  Output                0 ml  Net           967.56 ml     Assessment:  50 y.o. male is here with innominate artery dissection in setting of uncontrolled hypertension. He is asympomatic from dissection standpoint without stroke.   Plan: Carotid duplex today Will need gated CT chest/neck to re-evaluate innominate artery dissection bp control by medicine Should be on aspirin as outpatient   Brandon C. Donzetta Matters, MD [facility] Office: [phone] Pager: [phone]  08/05/2016 7:39 AM

## 2016-08-05 NOTE — Progress Notes (Signed)
[place] Progress Note Patient Name: Frank [name] DOB: 12/26/1965 MRN: [id number]   Date of Service  08/05/2016  HPI/Events of Note  Bradycardia - HR = 34 - 60. Esmolol IV infusion has been off for several hours. Suspect may be related to Esmolol IV infusion. Acid Metabolic has 1/2 life of 3.7 hours and is prolonged in renal failure. HD is planned for later today.   eICU Interventions  Will order: 1. D/C Esmolol IV infusion. 2. Cycle Troponin. 3. Bedside nurse to see if HD can be expedited.  4. Atropine 1 mg ampule to bedside.      Intervention Category Major Interventions: Arrhythmia - evaluation and management  Anndee Connett [place] 08/05/2016, 3:27 PM

## 2016-08-05 NOTE — Progress Notes (Signed)
[facility] ROUNDING NOTE   Subjective:   Interval History: The patient is a 51 y.o. year-old with history of DM2, NICM, HTN, combined CHF (35-40%) who presented to ED with back pain and abd pain and uncontrolled HTN.  CTA of chest/ abd/ pelvis showed two areas which could be dissection at the origin of the L brachiocephalic artery and at the proximal L common iliac artery.  Seen by vasc surgery, recommended medical management.   Objective:  Vital signs in last 24 hours:  Temp:  [97.7 F (36.5 C)-99 F (37.2 C)] 97.7 F (36.5 C) (01/15 1138) Pulse Rate:  [62-106] 69 (01/15 1000) Resp:  [9-22] 12 (01/15 1200) BP: (122-192)/(75-135) 122/75 (01/15 1200) SpO2:  [86 %-100 %] 86 % (01/15 1000) Weight:  [76.1 kg (167 lb 12.3 oz)-78.5 kg (173 lb 1 oz)] 76.2 kg (167 lb 15.9 oz) (01/15 0700)  Weight change:  Filed Weights   08/04/16 1500 08/04/16 1900 08/05/16 0700  Weight: 78.5 kg (173 lb 1 oz) 76.1 kg (167 lb 12.3 oz) 76.2 kg (167 lb 15.9 oz)    Intake/Output: I/O last 3 completed shifts: In: 967.6 [P.O.:880; I.V.:87.6] Out: 0    Intake/Output this shift:  No intake/output data recorded.  CVS- RRR RS- CTA ABD- BS present soft non-distended EXT- no edema  Left AVF    Basic Metabolic Panel:  Recent Labs Lab 08/04/16 0232  NA 141  K 5.1  CL 99*  CO2 28  GLUCOSE 67  BUN 26*  CREATININE 9.65*  CALCIUM 8.2*    Liver Function Tests:  Recent Labs Lab 08/04/16 0232  AST 20  ALT 10*  ALKPHOS 155*  BILITOT 0.4  PROT 7.3  ALBUMIN 3.1*    Recent Labs Lab 08/04/16 0232  LIPASE 83*   No results for input(s): AMMONIA in the last 168 hours.  CBC:  Recent Labs Lab 08/04/16 0232  WBC 5.5  NEUTROABS 3.3  HGB 8.5*  HCT 26.9*  MCV 87.3  PLT 214    Cardiac Enzymes: No results for input(s): CKTOTAL, CKMB, CKMBINDEX, TROPONINI in the last 168 hours.  BNP: Invalid input(s): POCBNP  CBG:  Recent Labs Lab 08/04/16 1646 08/04/16 2130  08/05/16 0729 08/05/16 1136  GLUCAP 73 88 66 74    Microbiology: Results for orders placed or performed during the hospital encounter of 08/04/16  MRSA PCR Screening     Status: Abnormal   Collection Time: 08/04/16  7:50 PM  Result Value Ref Range Status   MRSA by PCR POSITIVE (A) NEGATIVE Final    Comment:        The GeneXpert MRSA Assay (FDA approved for NASAL specimens only), is one component of a comprehensive MRSA colonization surveillance program. It is not intended to diagnose MRSA infection nor to guide or monitor treatment for MRSA infections. RESULT CALLED TO, READ BACK BY AND VERIFIED WITH: SARINE,R RN 2229 08/04/16 MITCHELL,L     Coagulation Studies:  Recent Labs  08/04/16 0638  LABPROT 14.1  INR 1.09    Urinalysis: No results for input(s): COLORURINE, LABSPEC, PHURINE, GLUCOSEU, HGBUR, BILIRUBINUR, KETONESUR, PROTEINUR, UROBILINOGEN, NITRITE, LEUKOCYTESUR in the last 72 hours.  Invalid input(s): APPERANCEUR    Imaging: X-ray Chest Pa And Lateral  Result Date: 08/05/2016 CLINICAL DATA:  51 year old male with end-stage renal disease. Initial encounter. EXAM: CHEST  2 VIEW COMPARISON:  CTA chest abdomen and pelvis 08/04/2016 and earlier. FINDINGS: Stable cardiomegaly and mediastinal contours. Increased pulmonary interstitial opacity and indistinctness of pulmonary vasculature compared to  07/14/2016. Small volume of pleural fluid along the major fissures now. No pneumothorax. No consolidation or confluent pulmonary opacity. Visualized tracheal air column is within normal limits. No acute osseous abnormality identified. IMPRESSION: 1. Appearance suggestive of mild pulmonary interstitial edema with small volume pleural effusion within the fissures. 2. Otherwise stable chest with chronic cardiomegaly. Electronically Signed   By: Genevie Ann M.D.   On: 08/05/2016 08:43   Ct Angio Chest/abd/pel For Dissection W And/or Wo Contrast  Result Date: 08/04/2016 CLINICAL DATA:   Back pain and concern for aortic dissection EXAM: CT ANGIOGRAPHY CHEST, ABDOMEN AND PELVIS TECHNIQUE: Multidetector CT imaging through the chest, abdomen and pelvis was performed using the standard protocol during bolus administration of intravenous contrast. Multiplanar reconstructed images and MIPs were obtained and reviewed to evaluate the vascular anatomy. CONTRAST:  100 mL Isovue 370 IV COMPARISON:  None. FINDINGS: CTA CHEST FINDINGS Cardiovascular: The heart is enlarged. There is a small pericardial effusion measuring 6 mm. There is a normal variant aortic arch branching pattern with the brachiocephalic and left common carotid arteries sharing a common origin. Precontrast imaging shows no intramural hematoma of the thoracic aorta. There is aortic atherosclerosis. There is a small there is a small outpouching from the inferior aspect of the aortic arch just distal to the origin of the left subclavian artery. There is a linear opacity within the proximal brachiocephalic artery (coronal image 50). Mediastinum/Nodes: No mediastinal, hilar or axillary lymphadenopathy. The visualized thyroid and thoracic esophageal course are unremarkable. Lungs/Pleura: Lungs are clear. No pleural effusion or pneumothorax. Musculoskeletal: No chest wall abnormality. No acute or significant osseous findings. Review of the MIP images confirms the above findings. CTA ABDOMEN AND PELVIS FINDINGS VASCULAR Aorta: Normal caliber aorta without aneurysm, dissection, vasculitis or significant stenosis. Celiac: Patent without evidence of aneurysm, dissection, vasculitis or significant stenosis. SMA: Patent without evidence of aneurysm, dissection, vasculitis or significant stenosis. Renals: There is atherosclerotic narrowing at the origins of both renal arteries. IMA: Patent without evidence of aneurysm, dissection, vasculitis or significant stenosis. Inflow: There is a small focal dissection of the left common iliac artery. There is moderate  to severe stenosis at the origin of the left internal iliac artery. There is extensive atherosclerotic calcification of the left internal iliac artery. There is no aneurysm or severe stenosis of the right iliac arteries. Review of the MIP images confirms the above findings. NON-VASCULAR Hepatobiliary: No focal liver abnormality is seen. No gallstones, gallbladder wall thickening, or biliary dilatation. Pancreas: Unremarkable. No pancreatic ductal dilatation or surrounding inflammatory changes. Spleen: Normal in size without focal abnormality. Adrenals/Urinary Tract: The adrenal glands are normal. Both kidneys are severely atrophic with multiple renal cysts. The largest cyst measures 4.2 cm. Stomach/Bowel: No dilated small bowel or other evidence of obstruction. No enteric or colonic inflammation. Lymphatic: No abdominal or pelvic lymphadenopathy. Reproductive: Unremarkable prostate and seminal vesicles. Other: Soft tissue mass in the left iliac fossa is suspected to be a failed renal transplant. Musculoskeletal: There is no bony spinal canal stenosis. No lytic or blastic lesions. Review of the MIP images confirms the above findings. IMPRESSION: 1. No aortic dissection. 2. Linear defect along wall of the proximal brachiocephalic artery as it arises from the aortic arch. While this may be secondary to motion, no definite motion could be identified in adjacent structures. This could indicate a short segment dissection. A cardiac gated study might be helpful to determine if this is a true finding or motion artifact. 3. Focal outpouching from the undersurface  of the aortic arch is in the typical location of the ductus diverticulum, which is the favored diagnosis. A small pseudo aneurysm or penetrating ulcer is considered less likely. 4. Small focal dissection within the proximal left common iliac artery. 5. Extensive aortic and iliac atherosclerosis. Severe narrowing of the left internal iliac artery secondary to  calcified and noncalcified plaque. 6. Soft tissue mass in the left iliac fossa is likely a failed renal transplant. Electronically Signed   By: Ulyses Jarred M.D.   On: 08/04/2016 05:22     Medications:   . esmolol Stopped (08/05/16 1240)   . allopurinol  100 mg Oral Daily  . amLODipine  10 mg Oral Daily  . atorvastatin  40 mg Oral q1800  . carvedilol  25 mg Oral BID WC  . Chlorhexidine Gluconate Cloth  6 each Topical Q0600  . cinacalcet  30 mg Oral QPM  . cloNIDine  0.3 mg Transdermal Q Mon  . doxazosin  2 mg Oral Daily  . gabapentin  100 mg Oral TID  . heparin  2,000 Units Dialysis Once in dialysis  . heparin subcutaneous  5,000 Units Subcutaneous Q8H  . hydrALAZINE  100 mg Oral BID  . insulin aspart  0-9 Units Subcutaneous TID WC  . isosorbide mononitrate  60 mg Oral Daily  . mupirocin ointment  1 application Nasal BID  . pantoprazole  40 mg Oral Daily  . sodium chloride flush  3 mL Intravenous Q12H   sodium chloride, sodium chloride, alteplase, bisacodyl, heparin, HYDROmorphone (DILAUDID) injection, ketorolac, lidocaine (PF), lidocaine-prilocaine, magnesium citrate, metoCLOPramide (REGLAN) injection, ondansetron (ZOFRAN) IV, ondansetron, oxyCODONE, pentafluoroprop-tetrafluoroeth, senna-docusate, traZODone  Assessment/ Plan:  1. HTN'sive crisis -  w poss aortic dissection  Patient has been moved to ICU  2. ESRD HD mwf ,  3. NICM last EF 35-40% 4. Hx DVT / PE in the past - not sure but think that his coumadin was stopped as these episodes were small/ local and felt to be complications of his long-term indwelling HD cath status.  5. L arm AVF - had declot in 12/17 but working well since then. 6. Anemia - gets esa    LOS: 0 Alekzander Cardell W _0 _1 :15 PM

## 2016-08-05 NOTE — Progress Notes (Signed)
Paged CCM for rate change with heart rate decrease

## 2016-08-05 NOTE — Evaluation (Signed)
Physical Therapy Evaluation Patient Details Name: Frank [name] MRN: [id number] DOB: 1966-01-10 Today's Date: 08/05/2016   History of Present Illness  51 y.o. male with a Past Medical History of ESRD, NICM, HTN, history of DVT on coumadin therapy, diabetes who presents with abdominal pain with radiation to his back, nausea, vomiting. CTA abdomen found linear defect along wall of the proximal brachiocephalic artery as well as left common illiac artery  Clinical Impression  Pt with relatively flat affect but pleasant and agreeable to limited mobility. Pt with LOB in standing and gait, difficulty stating how he feels but overall fatigued with abdominal pain. Pt educated for OOB to chair and stated he would stay up but then trying to stand to return to bed prior to assist in room. Pt returned to bed end of session with alarm on per his request. Pt demonstrates balance and gait deficits who will benefit from acute therapy to maximize mobility and independence to reduce fall risk.   HR 60 BP 138/102 after ambulation sats 100% on 2L with gait, 92% on RA at rest, unable to get reading while moving on RA so applied O2 for pt safety    Follow Up Recommendations Home health PT;Supervision for mobility/OOB    Equipment Recommendations  Rolling walker with 5" wheels    Recommendations for Other Services       Precautions / Restrictions Precautions Precautions: Fall Restrictions Weight Bearing Restrictions: No      Mobility  Bed Mobility Overal bed mobility: Needs Assistance Bed Mobility: Supine to Sit;Sit to Supine     Supine to sit: Min assist Sit to supine: Min guard   General bed mobility comments: Min hand held assist to elevate trunk to sitting. guarding for lines and safety  Transfers Overall transfer level: Needs assistance Equipment used: None Transfers: Sit to/from Stand Sit to Stand: Min guard         General transfer comment: Pt quick to move, transfers prior to  therapist being prepared. Unsteady on feet. stood from bed and recliner. LOB in standing with rising  Ambulation/Gait Ambulation/Gait assistance: Min assist Ambulation Distance (Feet): 100 Feet Assistive device: None Gait Pattern/deviations: Step-through pattern;Decreased stride length   Gait velocity interpretation: Below normal speed for age/gender General Gait Details: pt with 2 LoB with gait, able to recover without physical assist, 3 standing rest breaks leaning on rail with report of fatigue and gait terminated  Stairs            Wheelchair Mobility    Modified Rankin (Stroke Patients Only)       Balance Overall balance assessment: Needs assistance Sitting-balance support: Feet supported;No upper extremity supported Sitting balance-Leahy Scale: Good     Standing balance support: No upper extremity supported;During functional activity Standing balance-Leahy Scale: Fair                               Pertinent Vitals/Pain Pain Assessment: Faces Faces Pain Scale: Hurts even more Pain Location: abdomen Pain Descriptors / Indicators: Discomfort;Sore Pain Intervention(s): Monitored during session;Limited activity within patient's tolerance;Repositioned    Home Living Family/patient expects to be discharged to:: Private residence Living Arrangements: Alone Available Help at Discharge: Family;Friend(s);Available PRN/intermittently Type of Home: Apartment Home Access: Level entry     Home Layout: One level Home Equipment: None      Prior Function Level of Independence: Independent         Comments: independent with ADL, drives  Hand Dominance        Extremity/Trunk Assessment   Upper Extremity Assessment Upper Extremity Assessment: Defer to OT evaluation    Lower Extremity Assessment Lower Extremity Assessment: Overall WFL for tasks assessed    Cervical / Trunk Assessment Cervical / Trunk Assessment: Normal  Communication    Communication: No difficulties  Cognition Arousal/Alertness: Awake/alert Behavior During Therapy: Impulsive Overall Cognitive Status: Within Functional Limits for tasks assessed                      General Comments      Exercises     Assessment/Plan    PT Assessment Patient needs continued PT services  PT Problem List Decreased mobility;Decreased activity tolerance;Decreased balance          PT Treatment Interventions Gait training;Stair training;Balance training;Functional mobility training;Patient/family education;Therapeutic activities;DME instruction    PT Goals (Current goals can be found in the Care Plan section)  Acute Rehab PT Goals Patient Stated Goal: get something to eat PT Goal Formulation: With patient Time For Goal Achievement: 08/19/16 Potential to Achieve Goals: Good    Frequency Min 3X/week   Barriers to discharge Decreased caregiver support      Co-evaluation PT/OT/SLP Co-Evaluation/Treatment: Yes Reason for Co-Treatment: Complexity of the patient's impairments (multi-system involvement) PT goals addressed during session: Mobility/safety with mobility;Balance OT goals addressed during session: ADL's and self-care       End of Session Equipment Utilized During Treatment: Gait belt;Oxygen Activity Tolerance: Patient tolerated treatment well Patient left: in bed;with call bell/phone within reach;with bed alarm set Nurse Communication: Mobility status    Functional Assessment Tool Used: clinical judgement Functional Limitation: Mobility: Walking and moving around Mobility: Walking and Moving Around Current Status (M7672): At least 1 percent but less than 20 percent impaired, limited or restricted Mobility: Walking and Moving Around Goal Status [phone]): 0 percent impaired, limited or restricted    Time: 9628-3662 PT Time Calculation (min) (ACUTE ONLY): 29 min   Charges:   PT Evaluation $PT Eval Moderate Complexity: 1 Procedure      PT G Codes:   PT G-Codes **NOT FOR INPATIENT CLASS** Functional Assessment Tool Used: clinical judgement Functional Limitation: Mobility: Walking and moving around Mobility: Walking and Moving Around Current Status (H4765): At least 1 percent but less than 20 percent impaired, limited or restricted Mobility: Walking and Moving Around Goal Status [phone]): 0 percent impaired, limited or restricted    Jondavid Schreier B Lavella Myren 08/05/2016, 9:49 AM  Elwyn Reach, [place]

## 2016-08-06 ENCOUNTER — Inpatient Hospital Stay (HOSPITAL_COMMUNITY): Payer: Medicare Other

## 2016-08-06 DIAGNOSIS — R001 Bradycardia, unspecified: Secondary | ICD-10-CM

## 2016-08-06 DIAGNOSIS — R109 Unspecified abdominal pain: Secondary | ICD-10-CM

## 2016-08-06 LAB — RENAL FUNCTION PANEL
Albumin: 2.9 g/dL — ABNORMAL LOW (ref 3.5–5.0)
Anion gap: 13 (ref 5–15)
BUN: 17 mg/dL (ref 6–20)
CHLORIDE: 94 mmol/L — AB (ref 101–111)
CO2: 27 mmol/L (ref 22–32)
Calcium: 8.3 mg/dL — ABNORMAL LOW (ref 8.9–10.3)
Creatinine, Ser: 8.13 mg/dL — ABNORMAL HIGH (ref 0.61–1.24)
GFR, EST AFRICAN AMERICAN: 8 mL/min — AB (ref >60)
GFR, EST NON AFRICAN AMERICAN: 7 mL/min — AB (ref >60)
Glucose, Bld: 62 mg/dL — ABNORMAL LOW (ref 65–99)
Phosphorus: 5.7 mg/dL — ABNORMAL HIGH (ref 2.5–4.6)
Potassium: 4.5 mmol/L (ref 3.5–5.1)
Sodium: 134 mmol/L — ABNORMAL LOW (ref 135–145)

## 2016-08-06 LAB — CBC WITH DIFFERENTIAL/PLATELET
BASOS ABS: 0 10*3/uL (ref 0.0–0.1)
Basophils Relative: 0 %
Eosinophils Absolute: 0.4 10*3/uL (ref 0.0–0.7)
Eosinophils Relative: 7 %
HCT: 26.4 % — ABNORMAL LOW (ref 39.0–52.0)
Hemoglobin: 8.3 g/dL — ABNORMAL LOW (ref 13.0–17.0)
LYMPHS ABS: 1.1 10*3/uL (ref 0.7–4.0)
LYMPHS PCT: 19 %
MCH: 26.9 pg (ref 26.0–34.0)
MCHC: 31.4 g/dL (ref 30.0–36.0)
MCV: 85.7 fL (ref 78.0–100.0)
Monocytes Absolute: 0.8 10*3/uL (ref 0.1–1.0)
Monocytes Relative: 13 %
NEUTROS ABS: 3.4 10*3/uL (ref 1.7–7.7)
Neutrophils Relative %: 61 %
Platelets: 220 10*3/uL (ref 150–400)
RBC: 3.08 MIL/uL — AB (ref 4.22–5.81)
RDW: 15.7 % — ABNORMAL HIGH (ref 11.5–15.5)
WBC: 5.6 10*3/uL (ref 4.0–10.5)

## 2016-08-06 LAB — GLUCOSE, CAPILLARY
GLUCOSE-CAPILLARY: 89 mg/dL (ref 65–99)
GLUCOSE-CAPILLARY: 90 mg/dL (ref 65–99)
Glucose-Capillary: 70 mg/dL (ref 65–99)
Glucose-Capillary: 101 mg/dL — ABNORMAL HIGH (ref 65–99)
Glucose-Capillary: 105 mg/dL — ABNORMAL HIGH (ref 65–99)

## 2016-08-06 LAB — AMYLASE: AMYLASE: 124 U/L — AB (ref 28–100)

## 2016-08-06 LAB — LIPASE, BLOOD: LIPASE: 21 U/L (ref 11–51)

## 2016-08-06 LAB — TROPONIN I: Troponin I: 0.05 ng/mL (ref <0.03)

## 2016-08-06 LAB — MAGNESIUM: Magnesium: 1.8 mg/dL (ref 1.7–2.4)

## 2016-08-06 MED ORDER — IOPAMIDOL (ISOVUE-370) INJECTION 76%
INTRAVENOUS | Status: AC
  Administered 2016-08-06: 100 mL via INTRAVENOUS
  Filled 2016-08-06: qty 100

## 2016-08-06 MED ORDER — HYDRALAZINE HCL 20 MG/ML IJ SOLN: 10.0000 mg | INTRAMUSCULAR | Status: DC | PRN

## 2016-08-06 NOTE — Progress Notes (Signed)
Pt to transfer to [place]; report called to RN; will transport pt via wheelchair; will cont. To monitor.  Frank [name]

## 2016-08-06 NOTE — Progress Notes (Signed)
  Progress Note    08/06/2016 10:16 AM * No surgery found *  Subjective:  Continues to have abdominal pain  Vitals:   08/06/16 0900 08/06/16 0930  BP: 129/82   Pulse: 83 89  Resp: 15 17  Temp:      Physical Exam: aaox3 Neuro in tact moving all 4 Left arm avf with palpable thrill  CBC    Component Value Date/Time   WBC 5.6 08/06/2016 0619   RBC 3.08 (L) 08/06/2016 0619   HGB 8.3 (L) 08/06/2016 0619   HCT 26.4 (L) 08/06/2016 0619   PLT 220 08/06/2016 0619   MCV 85.7 08/06/2016 0619   MCH 26.9 08/06/2016 0619   MCHC 31.4 08/06/2016 0619   RDW 15.7 (H) 08/06/2016 0619   LYMPHSABS 1.1 08/06/2016 0619   MONOABS 0.8 08/06/2016 0619   EOSABS 0.4 08/06/2016 0619   BASOSABS 0.0 08/06/2016 0619    BMET    Component Value Date/Time   NA 134 (L) 08/06/2016 0619   K 4.5 08/06/2016 0619   CL 94 (L) 08/06/2016 0619   CO2 27 08/06/2016 0619   GLUCOSE 62 (L) 08/06/2016 0619   BUN 17 08/06/2016 0619   CREATININE 8.13 (H) 08/06/2016 0619   CALCIUM 8.3 (L) 08/06/2016 0619   GFRNONAA 7 (L) 08/06/2016 0619   GFRAA 8 (L) 08/06/2016 0619    INR    Component Value Date/Time   INR 1.12 08/05/2016 1504     Intake/Output Summary (Last 24 hours) at 08/06/16 1016 Last data filed at 08/06/16 0900  Gross per 24 hour  Intake              120 ml  Output             4000 ml  Net            -3880 ml     Assessment:  51 y.o. male is here with hypertension and abdominal pain. 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 Carotid duplexes wnl. BP now controlled  Plan: CT angio chest today to reevaluate innominate dissection bp management per primary Needs aspirin if not otherwise contraindicated   Frank Malay C. Donzetta Matters, MD [facility] Office: [phone] Pager: [phone]  08/06/2016 10:16 AM

## 2016-08-06 NOTE — Progress Notes (Signed)
Subjective:  Tolerated HD  Yesterday/ Feeling better but some stomach discomfort  Objective Vital signs in last 24 hours: Vitals:   08/06/16 1300 08/06/16 1330 08/06/16 1400 08/06/16 1430  BP: 112/78 109/76 (!) 87/54 98/61  Pulse: 80 78 70 68  Resp: _0 Temp:      TempSrc:      SpO2: 99% 97% 96% 96%  Weight:      Height:       Weight change: -2.3 kg (-5 lb 1.1 oz)  Physical Exam: General: awoken from sleep  Then alert , NAD, OX3 Heart: RRR, s4, no mur, rub or gallop Lungs: CTA  Abdomen: bs pos. Soft , Mild epigastric tenderness, no rebound, ND Extremities: no pedal edema Dialysis Access: pos bruit VAF    Dialysis: MWF [facility] 4h   2/2.25 bath  75kg  Hep 2000  LUA AVF  - Mircera 50 q 2 last 1/10  - Calcitriol 0.5 ug tiw  Problem/Plan: 1.  HTN crisis = / noncompliance with  op meds =resolved with hd and meds  2. Hyperkalemia= resolved with hd  3. ESRD - HD MWF  On schedule  4. Anemia - hgb 8.3  esa  Next on 08/14/16  5. Secondary hyperparathyroidism - vit d on hd  And binder 6. HTN/volume - as above 1 and NICM last EF 35-40% 7. Hx DVT / PE in the past -  Dr. Florene Glen stopped his coumadin as these episodes were small/ local and felt to be complications of his long-term indwelling HD cath status and compliance issues   Ernest Haber, PA-C [facility] [phone] 08/06/2016,4:07 PM  LOS: 1 day   Labs: Basic Metabolic Panel:  Recent Labs Lab 08/05/16 1504 08/05/16 1642 08/06/16 0619  NA 137 134* 134*  K >7.5* >7.5* 4.5  CL 98* 97* 94*  CO2 _1 GLUCOSE 69 68 62*  BUN 38* 40* 17  CREATININE 12.86* 13.65* 8.13*  CALCIUM 8.0* 7.8* 8.3*  PHOS 5.1* 5.2* 5.7*   Liver Function Tests:  Recent Labs Lab 08/04/16 0232 08/05/16 1504 08/05/16 1642 08/06/16 0619  AST 20 25  --   --   ALT 10* 6*  --   --   ALKPHOS 155* 132*  --   --   BILITOT 0.4 0.9  --   --   PROT 7.3 6.8  --   --   ALBUMIN 3.1* 2.5* 2.6* 2.9*     Recent Labs Lab 08/04/16 0232 08/06/16 0900  LIPASE 83* 21  AMYLASE  --  124*   No results for input(s): AMMONIA in the last 168 hours. CBC:  Recent Labs Lab 08/04/16 0232 08/05/16 1504 08/06/16 0619  WBC 5.5 6.7 5.6  NEUTROABS 3.3 4.8 3.4  HGB 8.5* 7.5* 8.3*  HCT 26.9* 23.8* 26.4*  MCV 87.3 86.9 85.7  PLT 214 238 220   Cardiac Enzymes:  Recent Labs Lab 08/05/16 1530 08/05/16 1642 08/06/16 0619  TROPONINI 0.04* 0.04* 0.05*   CBG:  Recent Labs Lab 08/05/16 1136 08/05/16 1814 08/05/16 2118 08/06/16 0949 08/06/16 1257  GLUCAP 74 89 94 90 70    Studies/Results: X-ray Chest Pa And Lateral  Result Date: 08/05/2016 CLINICAL DATA:  52 year old male with end-stage renal disease. Initial encounter. EXAM: CHEST  2 VIEW COMPARISON:  CTA chest abdomen and pelvis 08/04/2016 and earlier. FINDINGS: Stable cardiomegaly and mediastinal contours. Increased pulmonary interstitial opacity and indistinctness of pulmonary vasculature compared to 07/14/2016. Small volume of pleural  fluid along the major fissures now. No pneumothorax. No consolidation or confluent pulmonary opacity. Visualized tracheal air column is within normal limits. No acute osseous abnormality identified. IMPRESSION: 1. Appearance suggestive of mild pulmonary interstitial edema with small volume pleural effusion within the fissures. 2. Otherwise stable chest with chronic cardiomegaly. Electronically Signed   By: Genevie Ann M.D.   On: 08/05/2016 08:43   Ct Angio Chest Aorta W/cm &/or Wo/cm  Result Date: 08/06/2016 CLINICAL DATA:  Evaluate for aortic dissection. EXAM: CT ANGIOGRAPHY CHEST WITH CONTRAST TECHNIQUE: Multidetector CT imaging of the chest was performed using the standard protocol during bolus administration of intravenous contrast. Multiplanar CT image reconstructions and MIPs were obtained to evaluate the vascular anatomy. CONTRAST:  100 cc of Isovue 370 COMPARISON:  08/04/2016 and 01/31/2015 FINDINGS:  Cardiovascular: Normal heart size. Aortic atherosclerosis noted. The thoracic aorta appears intact. No dissection or aneurysm identified. The arch vessels also appear intact without evidence for dissection. The main pulmonary artery is patent. No lobar or segmental pulmonary artery filling defects identified. Mediastinum/Nodes: The trachea appears patent and is midline. Normal appearance of the esophagus. Left paratracheal lymph node measures 1.2 cm, unchanged from previous exam. Prominent left hilar lymph node is stable measuring 9 mm, image 81 of series 401. No axillary or supraclavicular adenopathy peer Lungs/Pleura: No pleural fluid identified. Pleural-parenchymal thickening overlying the posterior left lower lobe is identified subpleural nodule within the right middle lobe measures 4 mm, image 42 of series 402. Stable from 01/31/2015. Upper Abdomen: No acute abnormality. Musculoskeletal: No aggressive lytic or sclerotic bone lesions. Review of the MIP images confirms the above findings. IMPRESSION: 1. No evidence for dissection of the thoracic aorta or arch vessels. 2. Aortic atherosclerosis 3. Prominent mediastinal and hilar lymph nodes are stable from 01/31/2015. 4. 4 mm subpleural nodule within the right middle lobe. Unchanged from 01/31/2015. Likely benign. Electronically Signed   By: Kerby Moors M.D.   On: 08/06/2016 13:41   Medications:  . allopurinol  100 mg Oral Daily  . amLODipine  10 mg Oral Daily  . atorvastatin  40 mg Oral q1800  . carvedilol  25 mg Oral BID WC  . Chlorhexidine Gluconate Cloth  6 each Topical Q0600  . cinacalcet  30 mg Oral QPM  . cloNIDine  0.3 mg Transdermal Q Mon  . doxazosin  2 mg Oral Daily  . gabapentin  100 mg Oral TID  . heparin  2,000 Units Dialysis Once in dialysis  . heparin subcutaneous  5,000 Units Subcutaneous Q8H  . hydrALAZINE  100 mg Oral BID  . insulin aspart  0-9 Units Subcutaneous TID WC  . isosorbide mononitrate  60 mg Oral Daily  .  mupirocin ointment  1 application Nasal BID  . pantoprazole  40 mg Oral Daily  . sodium chloride flush  3 mL Intravenous Q12H

## 2016-08-06 NOTE — Progress Notes (Addendum)
PULMONARY / CRITICAL CARE MEDICINE   Name: Frank [name] MRN: [id number] DOB: 04/28/1966    ADMISSION DATE:  08/04/2016 CONSULTATION DATE:  08/04/2016  REFERRING MD:  Shon Millet DO  CHIEF COMPLAINT:  Hypertensive Emergency  HISTORY OF PRESENT ILLNESS:   Frank [name] is a 51 year old gentleman with history of ESRD on dialysis, chronic combined systolic and diastolic CHF, hypertension, DVT on Coumadin and nonischemic cardiomyopathy who presented with abdominal pain with nausea and vomiting.  A CTA abdomen was obtained to rule out dissection and showed two areas which could be dissection at the origin of the L brachiocephalic artery and at the proximal L common iliac artery.   Patient's blood pressures have been elevated during admission despite oral antihypertensive medications. PCCM was consulted for better management of blood pressure.  SUBJECTIVE: No acute events overnight. Patient underwent hemodialysis yesterday with hyperkalemia. She did have bradycardia yesterday and required atropine IV. Likely secondary to asthma wall infusion. Esmolol has been off since approximately noon on 1/15. Patient still reporting nonspecific abdominal pain that seemed to improve transiently with dialysis yesterday but has now recurred. Brief relief of pain with narcotic medications. Denies any nausea. No chest pain or pressure.  REVIEW OF SYSTEMS:  No dyspnea or cough. No subjective fever or chills. No headache or acute vision changes.  VITAL SIGNS: BP (!) 155/88   Pulse 87   Temp 99 F (37.2 C) (Oral)   Resp 14   Ht _0  (1.88 m)   Wt 157 lb 6.5 oz (71.4 kg)   SpO2 97%   BMI 20.21 kg/m   HEMODYNAMICS:    VENTILATOR SETTINGS:    INTAKE / OUTPUT: I/O last 3 completed shifts: In: 485.4 [P.O.:400; I.V.:85.4] Out: 4000 [Other:4000]  PHYSICAL EXAMINATION: Gen.: Appears comfortable. Lights off. Laying on his side watching TV. Integument: Warm and dry. No rash on exposed  skin. HEENT: Moist mucous membranes. No scleral icterus. No oral ulcers. Cardiovascular: Regular rate and rhythm. Sinus rhythm on telemetry. No edema. No JVD. Symmetric pulses. Pulmonary: Normal work of breathing on room air. Clear with auscultation. Speaking in complete sentences. Abdomen: Soft. Nondistended. Normal bowel sounds. Tender to palpation. Neurological: No meningismus. Cranial nerves grossly intact. Alert and oriented 4.  LABS:  BMET  Recent Labs Lab 08/05/16 1504 08/05/16 1642 08/06/16 0619  NA 137 134* 134*  K >7.5* >7.5* 4.5  CL 98* 97* 94*  CO2 _1 BUN 38* 40* 17  CREATININE 12.86* 13.65* 8.13*  GLUCOSE 69 68 62*    Electrolytes  Recent Labs Lab 08/05/16 1504 08/05/16 1642 08/06/16 0619  CALCIUM 8.0* 7.8* 8.3*  MG 1.9  --  1.8  PHOS 5.1* 5.2* 5.7*    CBC  Recent Labs Lab 08/04/16 0232 08/05/16 1504 08/06/16 0619  WBC 5.5 6.7 5.6  HGB 8.5* 7.5* 8.3*  HCT 26.9* 23.8* 26.4*  PLT 214 238 220    Coag's  Recent Labs Lab 08/04/16 0638 08/05/16 1504  INR 1.09 1.12    Sepsis Markers No results for input(s): LATICACIDVEN, PROCALCITON, O2SATVEN in the last 168 hours.  ABG No results for input(s): PHART, PCO2ART, PO2ART in the last 168 hours.  Liver Enzymes  Recent Labs Lab 08/04/16 0232 08/05/16 1504 08/05/16 1642 08/06/16 0619  AST 20 25  --   --   ALT 10* 6*  --   --   ALKPHOS 155* 132*  --   --   BILITOT 0.4 0.9  --   --  ALBUMIN 3.1* 2.5* 2.6* 2.9*    Cardiac Enzymes  Recent Labs Lab 08/05/16 1530 08/05/16 1642 08/06/16 0619  TROPONINI 0.04* 0.04* 0.05*    Glucose  Recent Labs Lab 08/04/16 1646 08/04/16 2130 08/05/16 0729 08/05/16 1136 08/05/16 1814 08/05/16 2118  GLUCAP 73 88 66 74 89 94    Imaging X-ray Chest Pa And Lateral  Result Date: 08/05/2016 CLINICAL DATA:  51 year old male with end-stage renal disease. Initial encounter. EXAM: CHEST  2 VIEW COMPARISON:  CTA chest abdomen and pelvis  08/04/2016 and earlier. FINDINGS: Stable cardiomegaly and mediastinal contours. Increased pulmonary interstitial opacity and indistinctness of pulmonary vasculature compared to 07/14/2016. Small volume of pleural fluid along the major fissures now. No pneumothorax. No consolidation or confluent pulmonary opacity. Visualized tracheal air column is within normal limits. No acute osseous abnormality identified. IMPRESSION: 1. Appearance suggestive of mild pulmonary interstitial edema with small volume pleural effusion within the fissures. 2. Otherwise stable chest with chronic cardiomegaly. Electronically Signed   By: Genevie Ann M.D.   On: 08/05/2016 08:43     STUDIES:  CTA CHEST/ABD/PELVIS (08/04/16): IMPRESSION: 1. No aortic dissection. 2. Linear defect along wall of the proximal brachiocephalic artery as it arises from the aortic arch. While this may be secondary to motion, no definite motion could be identified in adjacent structures. This could indicate a short segment dissection. A cardiac gated study might be helpful to determine if this is a true finding or motion artifact. 3. Focal outpouching from the undersurface of the aortic arch is in the typical location of the ductus diverticulum, which is the favored diagnosis. A small pseudo aneurysm or penetrating ulcer is considered less likely. 4. Small focal dissection within the proximal left common iliac artery. 5. Extensive aortic and iliac atherosclerosis. Severe narrowing of the left internal iliac artery secondary to calcified and noncalcified plaque. 6. Soft tissue mass in the left iliac fossa is likely a failed renal transplant. CAROTID DUPLEX (08/05/16):  Technically difficult study. Vertebral arteries appear patent with antegrade flow. Findings consistent with 1-39 percent stenosis involving right and left internal carotid arteries.  MICROBIOLOGY: MRSA PCR 1/14: Positive  ANTIBIOTICS: None  SIGNIFICANT EVENTS: 1/14 - Admit by [facility] for  hypertensive emergency with abdominal symptoms 1/14 - PCCM consult for uncontrolled hypertension 1/15 - HD w/ hyperkalemia  LINES/TUBES: PIV x1  ASSESSMENT / PLAN:  CARDIOVASCULAR A:  Hypertensive Emergency - Resolving. Left Common Iliac Artery Dissection Bradycardia - S/P Atropine on 1/15. Likely due to hyperkalemia & Esmolol. Now resolved.  Chronic Combined Systolic and Diastolic Heart Failure - 03/4708 LV EF 35-40% Nonischemic Cardiomyopathy   P:  Vitals per unit protocol Continuous telemetry monitoring  Continuing home Norvasc, Coreg bid, Cardura, Hydralazine, Imdur, &Catapres Patch. Esmolol discontinued Hydralazine IV prn SBP >165 Vascular Surgery Consulted & following Plan for Gated CT Neck & Chest to evaluate innominate artery this admission  PULMONARY A: OSA - On home CPAP.  P:   Continuous pulse oximetry Someone to bring in home CPAP  RENAL A:   Hyperkalemia - Resolved post hemodialysis. ESRD on Hemodialysis   P:   Nephrology Consulted and following Trending electrolytes daily Renal replacement therapy per nephrology recommendations  GASTROINTESTINAL A:   Abdominal Pain - Unclear etiology. LFTs normal & CT scan unrevealing.  P:   Carbohydrate Controlled Renal Diet as tolerated Protonix PO daily in lieu of home Prilosec Continuing Oxycocdone and Toradol prn pain Discontinuing Dilaudid Zofran IV prn  Checking Amylase/Lipase  HEMATOLOGIC A:   Anemia -  Secondary to chronic disease. No signs of active bleeding. Hgb stable. H/O PE - 2015. Notes indicated 6 months of anticoagulation planned.  P:  ESA with dialysis  Monitoring cell counts daily w/ CBC SCDs Heparin [place] q8hr  INFECTIOUS A:   No acute infection.  P:   Monitor for signs/symptoms of infection  ENDOCRINE A:   H/O DM Type 2 - Glucose controlled. Chronic Prednisone Use - Stopped a week prior to admission.  P:   Accu-Checks qAC & HS SSI per Sensitive Algorithm  with Meals Leaving off Prednisone for now  NEUROLOGIC A:   H/O Depression  P:   Continue to monitor Not currently on medication Continuing Neurontin 175m PO TID  FAMILY  - Updates: No family at bedside. Patient updated by Dr. NAshok Cordiaon 1/16.   - Inter-disciplinary family meet or Palliative Care meeting due by:  08/11/16   TODAY'S SUMMARY:  51y.o. male with known end-stage renal disease admitted with hypertensive emergency. Patient's hypertensive emergency has essentially resolved and significantly improved with resuming his home antihypertensive regimen. Patient's hyperkalemia yesterday has also resolved post hemodialysis. With patient's continued clinical stability I feel it is reasonable to transition him out of the intensive care unit to a telemetry unit bed today. He will require repeat CT angiogram of the neck and chest for his dissection as per vascular surgery recommendations.  TRH to assume care & PCCM off as of 1/17.  I have spent a total of 39 minutes of time today with at least 50% of this time in face-to-face discussion with the patient regarding his plan of care and transitioned out of the ICU as well as coordination of care with the accepting physician and plan of care.  JSonia BallerNAshok Cordia M.D. LAdv[facility] Hospital[facility] Pager:  [phone] 3pm or if no response, call [phone]/16/2018, 8:02 AM

## 2016-08-06 NOTE — Progress Notes (Signed)
Pt transferred to 6E14 via wheelchair; chart and pt belongings along for transfer; RN made aware of pt's arrival; pt assisted to bed.  Frank [name]

## 2016-08-06 NOTE — Progress Notes (Signed)
Pt transferred to room 6E14 from 2S.  VSS. Denies pain. Pt alert and oriented X 4.  Pleasant.

## 2016-08-06 NOTE — Care Management Note (Signed)
Case Management Note  Patient Details  Name: CRESENCIO REESOR MRN: [id number] Date of Birth: 02-Jun-1966  Subjective/Objective:            CM following for progression and d/c planning.         Action/Plan: 08/06/2016 Noted CM consult for assistance with medications, however this pt is insured therefore does not qualify for [facility] program . This CM will review meds for possible manufacture assistance programs, however this is usually not available to pt with insurance. Will talk with pt and encourage him to apply for [facility] Medicaid ,as this will assist with medications.   Expected Discharge Date:                  Expected Discharge Plan:  Home/Self Care  In-House Referral:  NA  Discharge planning Services  CM Consult, Medication Assistance  Post Acute Care Choice:  NA Choice offered to:  NA  DME Arranged:  N/A DME Agency:  NA  HH Arranged:  NA HH Agency:  NA  Status of Service:  Completed, signed off  If discussed at [place] of Stay Meetings, dates discussed:    Additional Comments:  Adron Bene, RN 08/06/2016, 3:12 PM

## 2016-08-06 NOTE — Progress Notes (Signed)
Pt last three bp's 87/54, 98/61, 99/53, paged Dr. Elsworth Soho pt to get coreg 25 mg po does he want to hold.

## 2016-08-07 ENCOUNTER — Other Ambulatory Visit: Payer: Self-pay | Admitting: *Deleted

## 2016-08-07 DIAGNOSIS — S25109D Unspecified injury of unspecified innominate or subclavian artery, subsequent encounter: Secondary | ICD-10-CM

## 2016-08-07 LAB — MAGNESIUM: Magnesium: 2.1 mg/dL (ref 1.7–2.4)

## 2016-08-07 LAB — CBC WITH DIFFERENTIAL/PLATELET
Basophils Absolute: 0 10*3/uL (ref 0.0–0.1)
Basophils Relative: 0 %
Eosinophils Absolute: 0.3 10*3/uL (ref 0.0–0.7)
Eosinophils Relative: 8 %
HEMATOCRIT: 25.1 % — AB (ref 39.0–52.0)
Hemoglobin: 8 g/dL — ABNORMAL LOW (ref 13.0–17.0)
Lymphocytes Relative: 24 %
Lymphs Abs: 1 10*3/uL (ref 0.7–4.0)
MCH: 27.3 pg (ref 26.0–34.0)
MCHC: 31.9 g/dL (ref 30.0–36.0)
MCV: 85.7 fL (ref 78.0–100.0)
MONO ABS: 0.6 10*3/uL (ref 0.1–1.0)
MONOS PCT: 15 %
NEUTROS ABS: 2.2 10*3/uL (ref 1.7–7.7)
Neutrophils Relative %: 53 %
Platelets: 223 10*3/uL (ref 150–400)
RBC: 2.93 MIL/uL — ABNORMAL LOW (ref 4.22–5.81)
RDW: 15.7 % — AB (ref 11.5–15.5)
WBC: 4.1 10*3/uL (ref 4.0–10.5)

## 2016-08-07 LAB — RENAL FUNCTION PANEL
Albumin: 2.7 g/dL — ABNORMAL LOW (ref 3.5–5.0)
Anion gap: 13 (ref 5–15)
BUN: 30 mg/dL — AB (ref 6–20)
CHLORIDE: 92 mmol/L — AB (ref 101–111)
CO2: 27 mmol/L (ref 22–32)
Calcium: 7.9 mg/dL — ABNORMAL LOW (ref 8.9–10.3)
Creatinine, Ser: 11.47 mg/dL — ABNORMAL HIGH (ref 0.61–1.24)
GFR calc Af Amer: 5 mL/min — ABNORMAL LOW (ref >60)
GFR calc non Af Amer: 4 mL/min — ABNORMAL LOW (ref >60)
GLUCOSE: 89 mg/dL (ref 65–99)
POTASSIUM: 4.7 mmol/L (ref 3.5–5.1)
Phosphorus: 5.8 mg/dL — ABNORMAL HIGH (ref 2.5–4.6)
Sodium: 132 mmol/L — ABNORMAL LOW (ref 135–145)

## 2016-08-07 LAB — GLUCOSE, CAPILLARY
GLUCOSE-CAPILLARY: 107 mg/dL — AB (ref 65–99)
Glucose-Capillary: 142 mg/dL — ABNORMAL HIGH (ref 65–99)
Glucose-Capillary: 133 mg/dL — ABNORMAL HIGH (ref 65–99)
Glucose-Capillary: 158 mg/dL — ABNORMAL HIGH (ref 65–99)

## 2016-08-07 MED ORDER — AMLODIPINE BESYLATE 5 MG PO TABS
5.0000 mg | ORAL_TABLET | Freq: Every day | ORAL | Status: DC
  Administered 2016-08-07: 5 mg via ORAL
  Filled 2016-08-07: qty 1

## 2016-08-07 NOTE — Progress Notes (Signed)
Subjective:  For hd today , chest discomfort better  Objective Vital signs in last 24 hours: Vitals:   08/06/16 1430 08/06/16 1736 08/06/16 2118 08/07/16 0628  BP: 98/61 (!) 99/53 118/72 113/80  Pulse: 68 65 61 66  Resp: _0 Temp:  98 F (36.7 C) 97.4 F (36.3 C) 98.2 F (36.8 C)  TempSrc:  Oral Oral   SpO2: 96% 98% 100% 98%  Weight:   71.6 kg (157 lb 13.6 oz)   Height:       Weight change: -4.6 kg (-10 lb 2.3 oz)  Physical Exam: General: alert , NAD, OX3 Heart: RRR, s4, no mur, rub or gallop Lungs: CTA  Abdomen: bs pos. Soft , Mild epigastric tenderness, no rebound, ND Extremities: no pedal edema Dialysis Access: pos bruit VAF    Dialysis:MWF [facility] 4h 2/2.25 bath 75kg Hep 2000 LUA AVF - Mircera 50 q 2 last 1/10 - Calcitriol 0.5 ug tiw  Problem/Plan: 1.  HTN crisis = / noncompliance with  op meds =resolved with hd and meds and now lowish = 99/53 last pm / this am 113/80 decrease bp meds  2. Hyperkalemia= resolved with hd /4.7 this am  3. ESRD - HD MWF  On schedule  4. Anemia - hgb 8.3 >8.0  esa  Next on 08/14/16  5. Secondary hyperparathyroidism - vit d on hd  And binder corec. Ca =8.9 / phos 5.8  6. HTN/volume - as above 1 and NICM last EF 35-40% 7. Hx DVT / PE in the past -  Dr. Florene Glen stopped his coumadin as these episodes were small/ local and felt to be complications of his long-term indwelling HD cath status and compliance issues  8. Chronic pain-/ anxiety syndrome  On Prozac/  oxycodone / robaxin / atarax  Prn  Bid on admit meds    Ernest Haber, PA-C [facility] [phone] 08/07/2016,8:53 AM  LOS: 2 days   Labs: Basic Metabolic Panel:  Recent Labs Lab 08/05/16 1642 08/06/16 0619 08/07/16 0710  NA 134* 134* 132*  K >7.5* 4.5 4.7  CL 97* 94* 92*  CO2 _1 GLUCOSE 68 62* 89  BUN 40* 17 30*  CREATININE 13.65* 8.13* 11.47*  CALCIUM 7.8* 8.3* 7.9*  PHOS 5.2* 5.7* 5.8*   Liver Function  Tests:  Recent Labs Lab 08/04/16 0232 08/05/16 1504 08/05/16 1642 08/06/16 0619 08/07/16 0710  AST 20 25  --   --   --   ALT 10* 6*  --   --   --   ALKPHOS 155* 132*  --   --   --   BILITOT 0.4 0.9  --   --   --   PROT 7.3 6.8  --   --   --   ALBUMIN 3.1* 2.5* 2.6* 2.9* 2.7*    Recent Labs Lab 08/04/16 0232 08/06/16 0900  LIPASE 83* 21  AMYLASE  --  124*   No results for input(s): AMMONIA in the last 168 hours. CBC:  Recent Labs Lab 08/04/16 0232 08/05/16 1504 08/06/16 0619 08/07/16 0710  WBC 5.5 6.7 5.6 4.1  NEUTROABS 3.3 4.8 3.4 2.2  HGB 8.5* 7.5* 8.3* 8.0*  HCT 26.9* 23.8* 26.4* 25.1*  MCV 87.3 86.9 85.7 85.7  PLT 214 238 220 223   Cardiac Enzymes:  Recent Labs Lab 08/05/16 1530 08/05/16 1642 08/06/16 0619  TROPONINI 0.04* 0.04* 0.05*   CBG:  Recent Labs Lab 08/06/16 0949 08/06/16 1257 08/06/16 1735  08/06/16 2116 08/07/16 0809  GLUCAP 90 70 101* 105* 142*    Studies/Results: Ct Angio Chest Aorta W/cm &/or Wo/cm  Result Date: 08/06/2016 CLINICAL DATA:  Evaluate for aortic dissection. EXAM: CT ANGIOGRAPHY CHEST WITH CONTRAST TECHNIQUE: Multidetector CT imaging of the chest was performed using the standard protocol during bolus administration of intravenous contrast. Multiplanar CT image reconstructions and MIPs were obtained to evaluate the vascular anatomy. CONTRAST:  100 cc of Isovue 370 COMPARISON:  08/04/2016 and 01/31/2015 FINDINGS: Cardiovascular: Normal heart size. Aortic atherosclerosis noted. The thoracic aorta appears intact. No dissection or aneurysm identified. The arch vessels also appear intact without evidence for dissection. The main pulmonary artery is patent. No lobar or segmental pulmonary artery filling defects identified. Mediastinum/Nodes: The trachea appears patent and is midline. Normal appearance of the esophagus. Left paratracheal lymph node measures 1.2 cm, unchanged from previous exam. Prominent left hilar lymph node is  stable measuring 9 mm, image 81 of series 401. No axillary or supraclavicular adenopathy peer Lungs/Pleura: No pleural fluid identified. Pleural-parenchymal thickening overlying the posterior left lower lobe is identified subpleural nodule within the right middle lobe measures 4 mm, image 42 of series 402. Stable from 01/31/2015. Upper Abdomen: No acute abnormality. Musculoskeletal: No aggressive lytic or sclerotic bone lesions. Review of the MIP images confirms the above findings. IMPRESSION: 1. No evidence for dissection of the thoracic aorta or arch vessels. 2. Aortic atherosclerosis 3. Prominent mediastinal and hilar lymph nodes are stable from 01/31/2015. 4. 4 mm subpleural nodule within the right middle lobe. Unchanged from 01/31/2015. Likely benign. Electronically Signed   By: Kerby Moors M.D.   On: 08/06/2016 13:41   Medications:  . allopurinol  100 mg Oral Daily  . amLODipine  10 mg Oral Daily  . atorvastatin  40 mg Oral q1800  . carvedilol  25 mg Oral BID WC  . Chlorhexidine Gluconate Cloth  6 each Topical Q0600  . cinacalcet  30 mg Oral QPM  . cloNIDine  0.3 mg Transdermal Q Mon  . doxazosin  2 mg Oral Daily  . gabapentin  100 mg Oral TID  . heparin  2,000 Units Dialysis Once in dialysis  . heparin subcutaneous  5,000 Units Subcutaneous Q8H  . hydrALAZINE  100 mg Oral BID  . insulin aspart  0-9 Units Subcutaneous TID WC  . isosorbide mononitrate  60 mg Oral Daily  . mupirocin ointment  1 application Nasal BID  . pantoprazole  40 mg Oral Daily  . sodium chloride flush  3 mL Intravenous Q12H

## 2016-08-07 NOTE — Consult Note (Signed)
[facility]  Subjective  - improved but still some chest pain   Objective 113/80 66 98.2 F (36.8 C) 18 98%  Intake/Output Summary (Last 24 hours) at 08/07/16 0923 Last data filed at 08/07/16 0600  Gross per 24 hour  Intake              600 ml  Output                0 ml  Net              600 ml   2+ radial pulses Neuro: symmetric upper lower extremity strength 5/5  CTA reviewed.  No dissection seen on repeat CT most likely artifact on prior CT  Assessment/Planning: Innominate dissection less likely Strict BP control Can see Dr Donzetta Matters in 4-6 weeks with repeat CT angio chest Call if questions  Frank [name] 08/07/2016 9:23 AM --  Laboratory Lab Results:  Recent Labs  08/06/16 0619 08/07/16 0710  WBC 5.6 4.1  HGB 8.3* 8.0*  HCT 26.4* 25.1*  PLT 220 223   BMET  Recent Labs  08/06/16 0619 08/07/16 0710  NA 134* 132*  K 4.5 4.7  CL 94* 92*  CO2 27 27  GLUCOSE 62* 89  BUN 17 30*  CREATININE 8.13* 11.47*  CALCIUM 8.3* 7.9*    COAG Lab Results  Component Value Date   INR 1.12 08/05/2016   INR 1.09 08/04/2016   INR 1.04 07/16/2016   No results found for: PTT

## 2016-08-07 NOTE — Progress Notes (Signed)
Name: Frank [name] MRN: [id number] DOB: 1966-02-21    ADMISSION DATE:  08/04/2016 CONSULTATION DATE:  08/04/2016  CHIEF COMPLAINT:  Hypertensive Emergency  HISTORY OF PRESENT ILLNESS:   [name] is a 51 year old gentleman with history of ESRD on dialysis, chronic combined systolic and diastolic CHF, hypertension, DVT on Coumadin and nonischemic cardiomyopathy who presented with abdominal pain with nausea and vomiting.  A CTA abdomen was obtained to rule out dissection and showed two areas which could be dissection at the origin of the L brachiocephalic artery and at the proximal L common iliac artery.   Patient's blood pressures have been elevated during admission despite oral antihypertensive medications. PCCM was consulted for better management of blood pressure.  SUBJECTIVE: Doing fair seen on dialysis no pains. Some concerns about chest discomfort etc.  REVIEW OF SYSTEMS:  No dyspnea or cough. No subjective fever or chills. No headache or acute vision changes.  VITAL SIGNS: BP (!) 141/85 (BP Location: Right Arm)   Pulse 70   Temp 98 F (36.7 C) (Oral)   Resp 18   Ht _0  (1.88 m)   Wt 71.6 kg (157 lb 13.6 oz)   SpO2 99%   BMI 20.27 kg/m   HEMODYNAMICS:    VENTILATOR SETTINGS:    INTAKE / OUTPUT: I/O last 3 completed shifts: In: 720 [P.O.:720] Out: 4000 [Other:4000]  PHYSICAL EXAMINATION: Gen.: Appears comfortable. No Specific distress Integument: Warm and dry. No rash on exposed skin. HEENT: Moist mucous membranes. No scleral icterus. No oral ulcers. Cardiovascular: Regular rate and rhythm. Sinus rhythm on telemetry. No edema. No JVD. Symmetric pulses. Pulmonary: Normal work of breathing on room air. Clear with auscultation. Speaking in complete sentences. Abdomen: Soft. Nondistended. Normal bowel sounds. Tender to palpation. Neurological: No meningismus. Cranial nerves grossly intact. Alert and oriented 4.  LABS:  BMET  Recent Labs Lab  08/05/16 1642 08/06/16 0619 08/07/16 0710  NA 134* 134* 132*  K >7.5* 4.5 4.7  CL 97* 94* 92*  CO2 _1 BUN 40* 17 30*  CREATININE 13.65* 8.13* 11.47*  GLUCOSE 68 62* 89    Electrolytes  Recent Labs Lab 08/05/16 1504 08/05/16 1642 08/06/16 0619 08/07/16 0710  CALCIUM 8.0* 7.8* 8.3* 7.9*  MG 1.9  --  1.8 2.1  PHOS 5.1* 5.2* 5.7* 5.8*    CBC  Recent Labs Lab 08/05/16 1504 08/06/16 0619 08/07/16 0710  WBC 6.7 5.6 4.1  HGB 7.5* 8.3* 8.0*  HCT 23.8* 26.4* 25.1*  PLT 238 220 223    Coag's  Recent Labs Lab 08/04/16 0638 08/05/16 1504  INR 1.09 1.12    Sepsis Markers No results for input(s): LATICACIDVEN, PROCALCITON, O2SATVEN in the last 168 hours.  ABG No results for input(s): PHART, PCO2ART, PO2ART in the last 168 hours.  Liver Enzymes  Recent Labs Lab 08/04/16 0232 08/05/16 1504 08/05/16 1642 08/06/16 0619 08/07/16 0710  AST 20 25  --   --   --   ALT 10* 6*  --   --   --   ALKPHOS 155* 132*  --   --   --   BILITOT 0.4 0.9  --   --   --   ALBUMIN 3.1* 2.5* 2.6* 2.9* 2.7*    Cardiac Enzymes  Recent Labs Lab 08/05/16 1530 08/05/16 1642 08/06/16 0619  TROPONINI 0.04* 0.04* 0.05*    Glucose  Recent Labs Lab 08/06/16 0949 08/06/16 1257 08/06/16 1735 08/06/16 2116 08/07/16 0809 08/07/16 1218  GLUCAP  90 70 101* 105* 142* 133*    Imaging No results found.   STUDIES:  CTA CHEST/ABD/PELVIS (08/04/16): IMPRESSION: 1. No aortic dissection. 2. Linear defect along wall of the proximal brachiocephalic artery as it arises from the aortic arch. While this may be secondary to motion, no definite motion could be identified in adjacent structures. This could indicate a short segment dissection. A cardiac gated study might be helpful to determine if this is a true finding or motion artifact. 3. Focal outpouching from the undersurface of the aortic arch is in the typical location of the ductus diverticulum, which is the favored  diagnosis. A small pseudo aneurysm or penetrating ulcer is considered less likely. 4. Small focal dissection within the proximal left common iliac artery. 5. Extensive aortic and iliac atherosclerosis. Severe narrowing of the left internal iliac artery secondary to calcified and noncalcified plaque. 6. Soft tissue mass in the left iliac fossa is likely a failed renal transplant. CAROTID DUPLEX (08/05/16):  Technically difficult study. Vertebral arteries appear patent with antegrade flow. Findings consistent with 1-39 percent stenosis involving right and left internal carotid arteries.  MICROBIOLOGY: MRSA PCR 1/14: Positive  ANTIBIOTICS: None  SIGNIFICANT EVENTS: 1/14 - Admit by [facility] for hypertensive emergency with abdominal symptoms 1/14 - PCCM consult for uncontrolled hypertension 1/15 - HD w/ hyperkalemia  LINES/TUBES: PIV x1  ASSESSMENT / PLAN:  CARDIOVASCULAR A:  Hypertensive Emergency - Resolving. Left Common Iliac Artery Dissection Bradycardia - S/P Atropine on 1/15. Likely due to hyperkalemia & Esmolol. Now resolved.  Chronic Combined Systolic and Diastolic Heart Failure - 07/8561 LV EF 35-40% Nonischemic Cardiomyopathy   P:  Continuous telemetry monitoring  Continuing home Norvasc, Coreg bid, Cardura, Hydralazine, Imdur, &Catapres Patch. Esmolol discontinued 1/15 Hydralazine IV prn SBP >165 Vascular Surgery Consulted & P's CT scan done 1/17 does not really confirm any dissection Follow-up appointment has been made with Dr. Donzetta Matters in 4 weeks  PULMONARY A: OSA - On home CPAP.  P:   Continuous pulse oximetry Someone to bring in home CPAP  RENAL A:   Hyperkalemia - Resolved post hemodialysis. ESRD on Hemodialysis   P:   Nephrology Consulted and following Trending electrolytes daily Renal replacement therapy per nephrology recommendations Dialyzed last 1/7  GASTROINTESTINAL A:   Abdominal Pain - Unclear etiology. LFTs normal & CT scan  unrevealing.  P:   Carbohydrate Controlled Renal Diet as tolerated Protonix PO daily in lieu of home Prilosec Continuing Oxycodone and Toradol prn pain Discontinuing Dilaudid Zofran IV prn  Checking Amylase/Lipase  HEMATOLOGIC A:   Anemia - Secondary to chronic disease. No signs of active bleeding. Hgb stable. H/O PE - 2015. Notes indicated 6 months of anticoagulation planned.  P:  ESA with dialysis  Baseline hemoglobin is 8 Monitoring cell counts daily w/ CBC SCDs Heparin [place] q8hr  INFECTIOUS A:   No acute infection.  P:   Monitor for signs/symptoms of infection  ENDOCRINE A:   H/O DM Type 2 - Glucose controlled. Chronic Prednisone Use - Stopped a week prior to admission.  P:   Accu-Checks qAC & HS SSI per Sensitive Algorithm with Meals Leaving off Prednisone for now  NEUROLOGIC A:   H/O Depression  P:   Continue to monitor Not currently on medication Continuing Neurontin 116m PO TID  FAMILY  - Updates: No family at bedside. Patient updated by Dr. NAshok Cordiaon 1/16.   - Inter-disciplinary family meet or Palliative Care meeting due by:  08/11/16   TODAY'S SUMMARY:  51  y.o. male with known end-stage renal disease admitted with hypertensive emergency. Patient's hypertensive emergency has essentially resolved and significantly improved with resuming his home antihypertensive regimen.  She can likely be discharged in the morning   08/07/2016, 3:04 PM

## 2016-08-07 NOTE — Progress Notes (Signed)
PT Cancellation Note  Patient Details Name: Frank [name] MRN: [id number] DOB: [birth year]   Cancelled Treatment:    Reason Eval/Treat Not Completed: Patient declined, no reason specified;Patient at procedure or test/unavailable.  Declined in am and now in HD.  Will see as able 1/19. 08/07/2016  Frank [name], Frank [name] [phone]  (pager)   Frank [name] 08/07/2016, 4:51 PM

## 2016-08-07 NOTE — Progress Notes (Signed)
Occupational Therapy Treatment Patient Details Name: Frank [name] MRN: [id number] DOB: May 01, 1966 Today's Date: 08/07/2016    History of present illness 51 y.o. male with a Past Medical History of ESRD, NICM, HTN, history of DVT on coumadin therapy, diabetes who presents with abdominal pain with radiation to his back, nausea, vomiting. CTA abdomen found linear defect along wall of the proximal brachiocephalic artery as well as left common illiac artery   OT comments  All Ot goals met and signing off currently. Pt with HR 83 max during session. Pt able to complete all adls.   Follow Up Recommendations  No OT follow up    Equipment Recommendations  None recommended by OT    Recommendations for Other Services      Precautions / Restrictions Precautions Precautions: Fall Precaution Comments: watch BP       Mobility Bed Mobility Overal bed mobility: Independent                Transfers Overall transfer level: Independent                    Balance                                   ADL Overall ADL's : Independent     Grooming: Wash/dry hands;Independent           Upper Body Dressing : Standing;Independent       Toilet Transfer: Independent       Scientist, research (medical): Tub transfer;Independent     General ADL Comments: pt asking alot of questions about his heart rate due to recent bradycardia. pt educated that incr in RR this session was WFL 70-83 with movement      Vision                     Perception     Praxis      Cognition   Behavior During Therapy: Impulsive Overall Cognitive Status: Within Functional Limits for tasks assessed                       Extremity/Trunk Assessment               Exercises     Shoulder Instructions       General Comments      Pertinent Vitals/ Pain       Pain Assessment: No/denies pain  Home Living                                          Prior Functioning/Environment              Frequency           Progress Toward Goals  OT Goals(current goals can now be found in the care plan section)  Progress towards OT goals: Goals met/education completed, patient discharged from OT  Acute Rehab OT Goals Patient Stated Goal: get something to eat OT Goal Formulation: With patient Time For Goal Achievement: 08/19/16 Potential to Achieve Goals: Good ADL Goals Pt Will Perform Grooming: with modified independence;standing Pt Will Perform Lower Body Bathing: with modified independence;sit to/from stand Pt Will Perform Lower Body Dressing: with modified independence;sit to/from stand Pt Will Transfer to Toilet: with modified independence;ambulating;regular height toilet Pt Will  Perform Toileting - Clothing Manipulation and hygiene: with modified independence;sit to/from stand Pt Will Perform Tub/Shower Transfer: Tub transfer;with modified independence;ambulating;shower seat  Plan      Co-evaluation                 End of Session Equipment Utilized During Treatment: Gait belt   Activity Tolerance Patient tolerated treatment well   Patient Left in bed;with call bell/phone within reach   Nurse Communication Mobility status;Precautions        Time: 1030-1057 OT Time Calculation (min): 27 min  Charges: OT General Charges $OT Visit: 1 Procedure OT Treatments $Self Care/Home Management : 23-37 mins  Parke Poisson B 08/07/2016, 2:11 PM    Jeri Modena   OTR/L Pager: [phone] Office: [phone] .

## 2016-08-08 ENCOUNTER — Ambulatory Visit (HOSPITAL_COMMUNITY): Admission: RE | Admit: 2016-08-08 | Payer: Medicare Other | Source: Ambulatory Visit

## 2016-08-08 LAB — GLUCOSE, CAPILLARY: Glucose-Capillary: 83 mg/dL (ref 65–99)

## 2016-08-08 MED ORDER — FAMOTIDINE 20 MG PO TABS: 20.0000 mg | ORAL_TABLET | Freq: Two times a day (BID) | ORAL | 0 refills | Status: DC

## 2016-08-08 MED ORDER — ISOSORBIDE MONONITRATE ER 60 MG PO TB24: 60.0000 mg | ORAL_TABLET | Freq: Every day | ORAL | 1 refills | Status: DC

## 2016-08-08 MED ORDER — DOXAZOSIN MESYLATE 2 MG PO TABS: 2.0000 mg | ORAL_TABLET | Freq: Every day | ORAL | 1 refills | Status: DC

## 2016-08-08 MED ORDER — CLONIDINE HCL 0.3 MG/24HR TD PTWK: 0.3000 mg | MEDICATED_PATCH | TRANSDERMAL | 12 refills | Status: DC

## 2016-08-08 MED ORDER — METHOCARBAMOL 500 MG PO TABS
500.0000 mg | ORAL_TABLET | Freq: Two times a day (BID) | ORAL | 0 refills | Status: DC | PRN
Start: 2016-08-08 — End: 2016-10-08

## 2016-08-08 MED ORDER — OMEPRAZOLE 20 MG PO CPDR: 20.0000 mg | DELAYED_RELEASE_CAPSULE | Freq: Every day | ORAL | 1 refills | Status: DC

## 2016-08-08 MED ORDER — AMLODIPINE BESYLATE 5 MG PO TABS: 5.0000 mg | ORAL_TABLET | Freq: Every day | ORAL | 1 refills | Status: DC

## 2016-08-08 MED ORDER — SENNOSIDES-DOCUSATE SODIUM 8.6-50 MG PO TABS: 1.0000 | ORAL_TABLET | Freq: Every evening | ORAL | 0 refills | Status: DC | PRN

## 2016-08-08 MED ORDER — HYDRALAZINE HCL 100 MG PO TABS
100.0000 mg | ORAL_TABLET | Freq: Two times a day (BID) | ORAL | 1 refills | Status: DC
Start: 2016-08-08 — End: 2016-09-03

## 2016-08-08 MED ORDER — CARVEDILOL 25 MG PO TABS: 25.0000 mg | ORAL_TABLET | Freq: Two times a day (BID) | ORAL | 1 refills | Status: DC

## 2016-08-08 NOTE — Progress Notes (Signed)
Frank [name] to be D/C'd Home per MD order.  Discussed prescriptions and follow up appointments with the patient. Prescriptions given to patient, medication list explained in detail. Pt verbalized understanding.  Allergies as of 08/08/2016      Reactions   Butalbital-apap-caffeine Shortness Of Breath, Swelling, Other (See Comments)   Swelling in throat   Ferrlecit [na Ferric Gluc Cplx In Sucrose] Shortness Of Breath, Swelling, Other (See Comments)   Swelling in throat   Minoxidil Shortness Of Breath   Tylenol [acetaminophen] Anaphylaxis, Swelling   Darvocet [propoxyphene N-acetaminophen] Hives      Medication List    STOP taking these medications   predniSONE 10 MG tablet Commonly known as:  DELTASONE   warfarin 2 MG tablet Commonly known as:  COUMADIN   warfarin 5 MG tablet Commonly known as:  COUMADIN     TAKE these medications   allopurinol 100 MG tablet Commonly known as:  ZYLOPRIM Take 100 mg by mouth daily.   amLODipine 5 MG tablet Commonly known as:  NORVASC Take 1 tablet (5 mg total) by mouth at bedtime. What changed:  medication strength  how much to take  when to take this   atorvastatin 40 MG tablet Commonly known as:  LIPITOR Take 1 tablet (40 mg total) by mouth daily at 6 PM.   carvedilol 25 MG tablet Commonly known as:  COREG Take 1 tablet (25 mg total) by mouth 2 (two) times daily with a meal.   cinacalcet 30 MG tablet Commonly known as:  SENSIPAR Take 30 mg by mouth every evening.   cloNIDine 0.3 mg/24hr patch Commonly known as:  CATAPRES - Dosed in mg/24 hr Place 1 patch (0.3 mg total) onto the skin every Monday. Start taking on:  08/12/2016 What changed:  when to take this   doxazosin 2 MG tablet Commonly known as:  CARDURA Take 1 tablet (2 mg total) by mouth daily.   famotidine 20 MG tablet Commonly known as:  PEPCID Take 1 tablet (20 mg total) by mouth 2 (two) times daily.   FLUoxetine 20 MG capsule Commonly known as:   PROZAC Take 20 mg by mouth daily.   gabapentin 100 MG capsule Commonly known as:  NEURONTIN Take 1 capsule (100 mg total) by mouth at bedtime. What changed:  when to take this   hydrALAZINE 100 MG tablet Commonly known as:  APRESOLINE Take 1 tablet (100 mg total) by mouth 2 (two) times daily.   HYDROcodone-acetaminophen 5-325 MG tablet Commonly known as:  NORCO/VICODIN Take 1 tablet by mouth every 6 (six) hours as needed for moderate pain.   hydrOXYzine 25 MG tablet Commonly known as:  ATARAX/VISTARIL Take 25 mg by mouth 2 (two) times daily as needed for anxiety.   isosorbide mononitrate 60 MG 24 hr tablet Commonly known as:  IMDUR Take 1 tablet (60 mg total) by mouth daily.   methocarbamol 500 MG tablet Commonly known as:  ROBAXIN Take 1 tablet (500 mg total) by mouth 2 (two) times daily as needed for muscle spasms.   omeprazole 20 MG capsule Commonly known as:  PRILOSEC Take 1 capsule (20 mg total) by mouth daily.   ondansetron 4 MG disintegrating tablet Commonly known as:  ZOFRAN ODT Take 1 tablet (4 mg total) by mouth every 8 (eight) hours as needed for nausea or vomiting.   oxycodone 5 MG capsule Commonly known as:  OXY-IR Take 1 capsule (5 mg total) by mouth every 4 (four) hours as needed for pain.  What changed:  Another medication with the same name was removed. Continue taking this medication, and follow the directions you see here.   oxyCODONE-acetaminophen 5-325 MG tablet Commonly known as:  PERCOCET/ROXICET Take 1 tablet by mouth every 6 (six) hours as needed for severe pain.   senna-docusate 8.6-50 MG tablet Commonly known as:  Senokot-S Take 1 tablet by mouth at bedtime as needed for mild constipation.   sevelamer carbonate 800 MG tablet Commonly known as:  RENVELA Take 1,600 mg by mouth 3 (three) times daily with meals.       Vitals:   08/07/16 2003 08/08/16 0644  BP: 135/83 125/86  Pulse: 78 74  Resp: 18 19  Temp: 98.6 F (37 C) 98.5 F  (36.9 C)    Skin clean, dry and intact without evidence of skin break down, no evidence of skin tears noted. IV catheter discontinued intact. Site without signs and symptoms of complications. Dressing and pressure applied. Pt denies pain at this time. No complaints noted.  An After Visit Summary was printed and given to the patient. Patient escorted via [place], and D/C home via private auto.  Retta Mac BSN, RN

## 2016-08-08 NOTE — Progress Notes (Signed)
Subjective: tolerated HD last pm / "feel ok to go home later today waiting for snow roads to get better"    Objective Vital signs in last 24 hours: Vitals:   08/07/16 1838 08/07/16 2003 08/07/16 2007 08/08/16 0644  BP: 139/83 135/83  125/86  Pulse: 69 78  74  Resp: _0 Temp: 98 F (36.7 C) 98.6 F (37 C)  98.5 F (36.9 C)  TempSrc: Oral Oral  Oral  SpO2: 95% 100%  100%  Weight:   72.1 kg (159 lb)   Height:       Weight change: 2 kg (4 lb 6.6 oz)  Physical Exam: General: alert , NAD, OX3 Heart: RRR, s4, no mur, rub or gallop Lungs: CTA  Abdomen: bs pos. Soft , NT , ND Extremities: no pedal edema Dialysis Access: pos bruit  LUA AVF   Dialysis:MWF [place] [facility] 4h 2/2.25 bath 75kg Hep 2000 LUA AVF - Mircera 50 q 2 last 1/10 - Calcitriol 0.5 ug tiw  Problem/Plan: 1. HTN crisis = / noncompliance with op meds =resolved with hd and meds / bp stabilizing now 125/86 this am  With bp meds and hd  Vol uf  2. Hyperkalemia= resolved with hd  3. ESRD - HD MWF On schedule  4. Anemia - hgb 8.3 >8.0 esa Next on 08/14/16  5. Secondary hyperparathyroidism - vit d on hd And binder corec. Ca =8.9 / phos 5.8  6. HTN/volume - as above 1 and NICM last EF 35-40%/ wt  yest 72.1  (new edw 72.5kg) 7. Hx DVT / PE in the past - Dr. Florene Glen stopped his coumadin as these episodes were small/ local and felt to be complications of his long-term indwelling HD cath status and compliance issues  8. Chronic pain-/ anxiety syndrome  On Prozac/  oxycodone / robaxin / atarax  Prn  Bid on admit meds  Ernest Haber, PA-C [facility] Beeper [phone] 08/08/2016,8:27 AM  LOS: 3 days   Labs: Basic Metabolic Panel:  Recent Labs Lab 08/05/16 1642 08/06/16 0619 08/07/16 0710  NA 134* 134* 132*  K >7.5* 4.5 4.7  CL 97* 94* 92*  CO2 _1 GLUCOSE 68 62* 89  BUN 40* 17 30*  CREATININE 13.65* 8.13* 11.47*  CALCIUM 7.8* 8.3* 7.9*  PHOS 5.2* 5.7* 5.8*   Liver  Function Tests:  Recent Labs Lab 08/04/16 0232 08/05/16 1504 08/05/16 1642 08/06/16 0619 08/07/16 0710  AST 20 25  --   --   --   ALT 10* 6*  --   --   --   ALKPHOS 155* 132*  --   --   --   BILITOT 0.4 0.9  --   --   --   PROT 7.3 6.8  --   --   --   ALBUMIN 3.1* 2.5* 2.6* 2.9* 2.7*    Recent Labs Lab 08/04/16 0232 08/06/16 0900  LIPASE 83* 21  AMYLASE  --  124*   No results for input(s): AMMONIA in the last 168 hours. CBC:  Recent Labs Lab 08/04/16 0232 08/05/16 1504 08/06/16 0619 08/07/16 0710  WBC 5.5 6.7 5.6 4.1  NEUTROABS 3.3 4.8 3.4 2.2  HGB 8.5* 7.5* 8.3* 8.0*  HCT 26.9* 23.8* 26.4* 25.1*  MCV 87.3 86.9 85.7 85.7  PLT 214 238 220 223   Cardiac Enzymes:  Recent Labs Lab 08/05/16 1530 08/05/16 1642 08/06/16 0619  TROPONINI 0.04* 0.04* 0.05*   CBG:  Recent Labs Lab  08/07/16 0809 08/07/16 1218 08/07/16 1835 08/07/16 2007 08/08/16 0741  GLUCAP 142* 133* 158* 107* 83    Studies/Results: Ct Angio Chest Aorta W/cm &/or Wo/cm  Result Date: 08/06/2016 CLINICAL DATA:  Evaluate for aortic dissection. EXAM: CT ANGIOGRAPHY CHEST WITH CONTRAST TECHNIQUE: Multidetector CT imaging of the chest was performed using the standard protocol during bolus administration of intravenous contrast. Multiplanar CT image reconstructions and MIPs were obtained to evaluate the vascular anatomy. CONTRAST:  100 cc of Isovue 370 COMPARISON:  08/04/2016 and 01/31/2015 FINDINGS: Cardiovascular: Normal heart size. Aortic atherosclerosis noted. The thoracic aorta appears intact. No dissection or aneurysm identified. The arch vessels also appear intact without evidence for dissection. The main pulmonary artery is patent. No lobar or segmental pulmonary artery filling defects identified. Mediastinum/Nodes: The trachea appears patent and is midline. Normal appearance of the esophagus. Left paratracheal lymph node measures 1.2 cm, unchanged from previous exam. Prominent left hilar lymph  node is stable measuring 9 mm, image 81 of series 401. No axillary or supraclavicular adenopathy peer Lungs/Pleura: No pleural fluid identified. Pleural-parenchymal thickening overlying the posterior left lower lobe is identified subpleural nodule within the right middle lobe measures 4 mm, image 42 of series 402. Stable from 01/31/2015. Upper Abdomen: No acute abnormality. Musculoskeletal: No aggressive lytic or sclerotic bone lesions. Review of the MIP images confirms the above findings. IMPRESSION: 1. No evidence for dissection of the thoracic aorta or arch vessels. 2. Aortic atherosclerosis 3. Prominent mediastinal and hilar lymph nodes are stable from 01/31/2015. 4. 4 mm subpleural nodule within the right middle lobe. Unchanged from 01/31/2015. Likely benign. Electronically Signed   By: Kerby Moors M.D.   On: 08/06/2016 13:41   Medications:  . allopurinol  100 mg Oral Daily  . amLODipine  5 mg Oral QHS  . atorvastatin  40 mg Oral q1800  . carvedilol  25 mg Oral BID WC  . Chlorhexidine Gluconate Cloth  6 each Topical Q0600  . cinacalcet  30 mg Oral QPM  . cloNIDine  0.3 mg Transdermal Q Mon  . doxazosin  2 mg Oral Daily  . gabapentin  100 mg Oral TID  . heparin  2,000 Units Dialysis Once in dialysis  . heparin subcutaneous  5,000 Units Subcutaneous Q8H  . hydrALAZINE  100 mg Oral BID  . insulin aspart  0-9 Units Subcutaneous TID WC  . isosorbide mononitrate  60 mg Oral Daily  . mupirocin ointment  1 application Nasal BID  . pantoprazole  40 mg Oral Daily  . sodium chloride flush  3 mL Intravenous Q12H

## 2016-08-08 NOTE — Care Management Important Message (Signed)
Important Message  Patient Details  Name: Frank [name] MRN: [id number] Date of Birth: [birth year]   Medicare Important Message Given:  Yes    Derris Millan, Rory Percy, RN 08/08/2016, 10:55 AM

## 2016-08-08 NOTE — Discharge Summary (Signed)
Physician Discharge Summary  Frank [name] ELF:[id number] DOB: February 17, 1966 DOA: 08/04/2016  PCP: Angelica Chessman, MD  Admit date: 08/04/2016 Discharge date: 08/08/2016  Admitted From: Home Disposition:  Home   Recommendations for Outpatient Follow-up:  1. Follow up with PCP in 1-2 weeks 2. Please obtain BMP/CBC in one week 3. Patient requires a repeat CT scan of the chest with outpatient vascular specialist Dr. Donzetta Matters as there was a concern of a dissection of one of the arteries of the aortic arch which was later felt not to be the case  Home Health: No Equipment/Devices: None is required  Discharge Condition: Stable CODE STATUS:  FULL Diet recommendation: Heart Healthy  Brief/Interim Summary:   51 year old gentleman with history of ESRD on dialysis, chronic combined systolic and diastolic CHF, hypertension, DVT on Coumadin and nonischemic cardiomyopathy who presented with abdominal pain with nausea and vomiting. A CTA abdomen was obtained to rule out dissection and showed two areas which could be dissection at the origin of the L brachiocephalic artery and at the proximal L common iliac artery. Patient's blood pressures have been elevated during admission despite oral antihypertensive medications   Discharge Diagnoses:  Principal Problem:   Abdominal pain Active Problems:   Anemia of chronic kidney failure   ESRD on hemodialysis (HCC)   Dyslipidemia   Essential hypertension   Hypertensive urgency   Constipation   Nonischemic cardiomyopathy (HCC)   Chronic back pain   End-stage renal disease on hemodialysis [facility])   Dissection of other specified artery [facility])   Hypertensive emergency   Patient was admitted and subsequently transferred to the ICU with hypertensive emergency and possible left common iliac artery dissection in addition to possible innominate artery dissection--  was placed on esmolol drip complicated by some bradycardia requiring atropine. She was started on  home medications of Norvasc Coreg Cardura hydralazine Imdur and Catapres and I gave him prescriptions for all these medications Vascular surgery recommended repeat CT neck chest rate innominate artery and it was felt by Dr. Oneida Alar that this was not an acute issue and that there was no actual dissection He is to continue his regular ESRD treatment in [place], continue his home CPAP His abdominal pain is chronic and is better with Protonix Prilosec although he still has some radiation He has had about 17 admissions to the emergency room in the recent past and is clearly 7 pregnant and being able to stay out of the hospital-I've given him. Prescriptions for all of his medications and informed him to follow-up with Dr. Donzetta Matters as an outpatient and they will call him  He was evaluated on 08/08/2016 felt stable for discharge  STUDIES: CTA CHEST/ABD/PELVIS (08/04/16): IMPRESSION: 1. No aortic dissection. 2. Linear defect along wall of the proximal brachiocephalic artery as it arises from the aortic arch. While this may be secondary to motion, no definite motion could be identified in adjacent structures. This could indicate a short segment dissection. A cardiac gated study might be helpful to determine if this is a true finding or motion artifact. 3. Focal outpouching from the undersurface of the aortic arch is in the typical location of the ductus diverticulum, which is the favored diagnosis. A small pseudo aneurysm or penetrating ulcer is considered less likely. 4. Small focal dissection within the proximal left common iliac artery. 5. Extensive aortic and iliac atherosclerosis. Severe narrowing of the left internal iliac artery secondary to calcified and noncalcified plaque. 6. Soft tissue mass in the left iliac fossa is likely a failed  renal transplant. CAROTID DUPLEX (08/05/16):  Technically difficult study. Vertebral arteries appear patent with antegrade flow. Findings consistent with 1-39 percent stenosis  involving right and left internal carotid arteries.  MICROBIOLOGY: MRSA PCR 1/14: Positive  ANTIBIOTICS: None  SIGNIFICANT EVENTS: 1/14 - Admit by [facility] for hypertensive emergency with abdominal symptoms 1/14 - PCCM consult for uncontrolled hypertension 1/15 - HD w/ hyperkalemia  LINES/TUBES: PIV x1 Discharge Instructions  Discharge Instructions    Diet - low sodium heart healthy    Complete by:  As directed    Discharge instructions    Complete by:  As directed    You have been provided with scripts for all of your blood pressure medications including amlodipine, Coreg, clonidine, doxazosin, hydralazine and Imdur. Please make sure that you take these medications on discharge. In addition please take your acid reducer Pepcid as well as a vital second annual Robaxin for back pain. I do not think there is any other thing to worry about in terms of any structural abnormalities in your body or other issues and he should follow-up with vascular surgery in about 4 weeks to get another CT scan to confirm no other issues. Please follow up with dialysis as usual   Increase activity slowly    Complete by:  As directed      Allergies as of 08/08/2016      Reactions   Butalbital-apap-caffeine Shortness Of Breath, Swelling, Other (See Comments)   Swelling in throat   Ferrlecit [na Ferric Gluc Cplx In Sucrose] Shortness Of Breath, Swelling, Other (See Comments)   Swelling in throat   Minoxidil Shortness Of Breath   Tylenol [acetaminophen] Anaphylaxis, Swelling   Darvocet [propoxyphene N-acetaminophen] Hives      Medication List    STOP taking these medications   predniSONE 10 MG tablet Commonly known as:  DELTASONE   warfarin 2 MG tablet Commonly known as:  COUMADIN   warfarin 5 MG tablet Commonly known as:  COUMADIN     TAKE these medications   allopurinol 100 MG tablet Commonly known as:  ZYLOPRIM Take 100 mg by mouth daily.   amLODipine 5 MG tablet Commonly known as:   NORVASC Take 1 tablet (5 mg total) by mouth at bedtime. What changed:  medication strength  how much to take  when to take this   atorvastatin 40 MG tablet Commonly known as:  LIPITOR Take 1 tablet (40 mg total) by mouth daily at 6 PM.   carvedilol 25 MG tablet Commonly known as:  COREG Take 1 tablet (25 mg total) by mouth 2 (two) times daily with a meal.   cinacalcet 30 MG tablet Commonly known as:  SENSIPAR Take 30 mg by mouth every evening.   cloNIDine 0.3 mg/24hr patch Commonly known as:  CATAPRES - Dosed in mg/24 hr Place 1 patch (0.3 mg total) onto the skin every Monday. Start taking on:  08/12/2016 What changed:  when to take this   doxazosin 2 MG tablet Commonly known as:  CARDURA Take 1 tablet (2 mg total) by mouth daily.   famotidine 20 MG tablet Commonly known as:  PEPCID Take 1 tablet (20 mg total) by mouth 2 (two) times daily.   FLUoxetine 20 MG capsule Commonly known as:  PROZAC Take 20 mg by mouth daily.   gabapentin 100 MG capsule Commonly known as:  NEURONTIN Take 1 capsule (100 mg total) by mouth at bedtime. What changed:  when to take this   hydrALAZINE 100 MG tablet Commonly  known as:  APRESOLINE Take 1 tablet (100 mg total) by mouth 2 (two) times daily.   HYDROcodone-acetaminophen 5-325 MG tablet Commonly known as:  NORCO/VICODIN Take 1 tablet by mouth every 6 (six) hours as needed for moderate pain.   hydrOXYzine 25 MG tablet Commonly known as:  ATARAX/VISTARIL Take 25 mg by mouth 2 (two) times daily as needed for anxiety.   isosorbide mononitrate 60 MG 24 hr tablet Commonly known as:  IMDUR Take 1 tablet (60 mg total) by mouth daily.   methocarbamol 500 MG tablet Commonly known as:  ROBAXIN Take 1 tablet (500 mg total) by mouth 2 (two) times daily as needed for muscle spasms.   omeprazole 20 MG capsule Commonly known as:  PRILOSEC Take 1 capsule (20 mg total) by mouth daily.   ondansetron 4 MG disintegrating tablet Commonly  known as:  ZOFRAN ODT Take 1 tablet (4 mg total) by mouth every 8 (eight) hours as needed for nausea or vomiting.   oxycodone 5 MG capsule Commonly known as:  OXY-IR Take 1 capsule (5 mg total) by mouth every 4 (four) hours as needed for pain. What changed:  Another medication with the same name was removed. Continue taking this medication, and follow the directions you see here.   oxyCODONE-acetaminophen 5-325 MG tablet Commonly known as:  PERCOCET/ROXICET Take 1 tablet by mouth every 6 (six) hours as needed for severe pain.   senna-docusate 8.6-50 MG tablet Commonly known as:  Senokot-S Take 1 tablet by mouth at bedtime as needed for mild constipation.   sevelamer carbonate 800 MG tablet Commonly known as:  RENVELA Take 1,600 mg by mouth 3 (three) times daily with meals.      Follow-up Information    Servando Snare, MD In 4 weeks.   Specialties:  Vascular Surgery, Cardiology Why:  Office will call you to arrange your appt (sent) Contact information: [address] [place] [address] [phone]          Allergies  Allergen Reactions  . Butalbital-Apap-Caffeine Shortness Of Breath, Swelling and Other (See Comments)    Swelling in throat  . Ferrlecit [Na Ferric Gluc Cplx In Sucrose] Shortness Of Breath, Swelling and Other (See Comments)    Swelling in throat  . Minoxidil Shortness Of Breath  . Tylenol [Acetaminophen] Anaphylaxis and Swelling  . Darvocet [Propoxyphene N-Acetaminophen] Hives    Consultations:  Dr. Servando Snare of vascular surgery   Procedures/Studies: X-ray Chest Pa And Lateral  Result Date: 08/05/2016 CLINICAL DATA:  51 year old male with end-stage renal disease. Initial encounter. EXAM: CHEST  2 VIEW COMPARISON:  CTA chest abdomen and pelvis 08/04/2016 and earlier. FINDINGS: Stable cardiomegaly and mediastinal contours. Increased pulmonary interstitial opacity and indistinctness of pulmonary vasculature compared to 07/14/2016. Small volume of  pleural fluid along the major fissures now. No pneumothorax. No consolidation or confluent pulmonary opacity. Visualized tracheal air column is within normal limits. No acute osseous abnormality identified. IMPRESSION: 1. Appearance suggestive of mild pulmonary interstitial edema with small volume pleural effusion within the fissures. 2. Otherwise stable chest with chronic cardiomegaly. Electronically Signed   By: Genevie Ann M.D.   On: 08/05/2016 08:43   Ct Abdomen Pelvis W Contrast  Result Date: 07/29/2016 CLINICAL DATA:  Upper abdominal pain. Dialysis patient with failed renal transplant from 2006. History of diabetes. EXAM: CT ABDOMEN AND PELVIS WITH CONTRAST TECHNIQUE: Multidetector CT imaging of the abdomen and pelvis was performed using the standard protocol following bolus administration of intravenous contrast. CONTRAST:  32m ISOVUE-300  IOPAMIDOL (ISOVUE-300) INJECTION 61%, 118m ISOVUE-300 IOPAMIDOL (ISOVUE-300) INJECTION 61% COMPARISON:  01/01/2016 FINDINGS: Lower chest: No acute abnormality. Hepatobiliary: Mild diffuse fatty infiltration of the liver. No focal lesions. Gallbladder and bile ducts are unremarkable. Pancreas: Unremarkable. No pancreatic ductal dilatation or surrounding inflammatory changes. Spleen: Normal in size without focal abnormality. Adrenals/Urinary Tract: No adrenal gland nodules. Kidneys are atrophic bilaterally. No hydronephrosis. Low-attenuation mass in the left kidney measuring 4.1 cm diameter, unchanged since prior study, measures low-density but contains internal architectural change. Solid mass is not excluded. Delayed renal nephrograms. No contrast material demonstrated renal collecting systems. Bladder is decompressed. Left pelvic transplant kidney demonstrating prominent vascular calcification. No contrast uptake is identified. Stomach/Bowel: Motion artifact limits evaluation. No evidence of significant bowel distention or wall thickening. Colon is decompressed. Appendix  is not identified. Vascular/Lymphatic: Diffuse calcification of abdominal aorta and branch vessels. No aneurysm. Inferior vena cava is unremarkable. No lymphadenopathy. Reproductive: Prostate is unremarkable. Other: No abdominal wall hernia or abnormality. No abdominopelvic ascites. Musculoskeletal: No acute or significant osseous findings. IMPRESSION: No acute process demonstrated in the abdomen or pelvis. Mild fatty infiltration of the liver. Atrophic native kidneys with low-attenuation indeterminate mass lesion in the left kidney, unchanged. Transplanted left pelvic kidney with vascular calcification. No renal function is identified. No evidence of bowel obstruction or inflammation. Aortic atherosclerosis. Electronically Signed   By: WLucienne CapersM.D.   On: 07/29/2016 01:19   Ir [place] Vasc Access Left  Result Date: 07/16/2016 INDICATION: 51year old male with nonfunctioning left upper extremity radial-basilic fistula. [facility] There was then a revision and bovine patch angioplasty secondary to a hematoma on April 12 2016. The fistula has been used 5 times over the last 2 weeks and has thrombosed today. EXAM: IR THROMBECTOMY AV FISTULA W/THROMBOLYSIS INC/SHUNT/IMG LEFT; IR ULTRASOUND GUIDANCE VASC ACCESS LEFT MEDICATIONS: 6 mg Versed, 200 mcg fentanyl, 3 mg tPA, 50 mg Benadryl, ANESTHESIA/SEDATION: Moderate Sedation Time:  125 The patient was continuously monitored during the procedure by the interventional radiology nurse under my direct supervision. FLUOROSCOPY TIME:  Fluoroscopy Time: 14 minutes 18 seconds (3.1 mGy). COMPLICATIONS: None PROCEDURE: The procedure, risks, benefits, and alternatives were explained to the patient and the patient's family, including bleeding, infection, arterial thrombus, venous thrombus, vessel injury, need for further procedure, need for stenting, contrast reaction, need for catheter placement,  cardiopulmonary collapse, death. Questions regarding the procedure were encouraged and answered. The patient understands and consents to the procedure. Ultrasound survey was performed with images stored and sent to PACs. The left upper extremity was prepped and draped in the usual sterile fashion. Ultrasound guidance was used to access the upper extremity fistula with a micropuncture kit. Exchange was made for a 6 [place] sheath. A Bentson wire was then a navigate into the venous outflow, with an angled catheter advanced into the venous outflow. Venogram of the left upper extremity demonstrates patency of the venous outflow. Upon withdrawal of the angled catheter, a solution of 2 milligrams of tPA in saline was infused into the outflow portion of the graft. A 5 mm diameter balloon catheter was used to macerate the thrombosed segment. Balloon angioplasty of the stenotic venous segment was performed with 5 mm diameter balloon. Ultrasound guidance was then used access the fistula directed toward the arterial anastomosis. Once the micro sheath was in place, an exchange was made for a 6 [place] sheath. Bentson wire was placed into the proximal venous outflow and balloon  angioplasty of the proximal venous outflow was performed to 5 mm diameter. Glidewire was then navigated across the arterial anastomosis, with an over the wire Fogarty balloon advanced into the artery. The arterial plug was pulled, with passage of the Fogarty balloon twice through the thrombosed anastomosis. Repeat venogram demonstrated no restoration of flow. Repeat balloon angioplasty to 5 mm and than 6 mm of the venous outflow was performed of both the centrally directed an the peripherally directed sheath. A 5 millimeter x 4 centimeter balloon was used to balloon angioplasty the arterial anastomosis. Repeat fistulogram was then performed, demonstrating embolism into the distal radial artery. 2 mg of tPA was then infused at the embolism, with  repeat angiogram demonstrating restoration of flow to the hand. Repeat balloon angioplasty of the venous outflow was then again performed through the centrally directed sheath. Restoration of flow was achieved, although there was discontinuous flow through the axillary vein. Balloon angioplasty was performed at this segment of the presumed residual thrombus at this site. Complete fistulagram performed through the central vasculature, demonstrating restoration of flow through the fistula and the axillary vein. Palpable thrill was present at the completion of the case. The short sheaths were removed after placement of hemostasis sutures. Patient tolerated the procedure well and remained hemodynamically stable throughout. No complications were encountered and no significant blood loss was encountered. IMPRESSION: Status post pharmacologic and mechanical thrombectomy/ thrombolysis of occluded left radio basilic dialysis fistula, with sequential balloon angioplasty of proximal venous outflow stenosis to 5 mm and 6 mm diameter and restoration of flow. Signed, Dulcy Fanny. Earleen Newport, DO [facility] ACCESS: If there is early re- thrombosis of the fistula, tunneled hemodialysis catheter would be considered the next step as well as referral for surgical evaluation of alternative circuit. Electronically Signed   By: Corrie Mckusick D.O.   On: 07/16/2016 12:43   Portable Chest 1 View  Result Date: 07/14/2016 CLINICAL DATA:  Pain to forearm dialysis fistula. EXAM: PORTABLE CHEST 1 VIEW COMPARISON:  07/10/2016 FINDINGS: The cardiac silhouette is enlarged. Mediastinal contours appear intact. There is no evidence of focal airspace consolidation, or pleural effusion. Asymmetric lucency of the left lung may be artifactual. Osseous structures are without acute abnormality. Soft tissues are grossly normal. IMPRESSION: No definite evidence of active disease. Enlarged cardiac  silhouette. Asymmetric lucency of the left lung may be artifactual. If there is clinical suspicion for left pneumothorax, decubital chest radiograph, right side down, may be considered. Electronically Signed   By: Fidela Salisbury M.D.   On: 07/14/2016 23:50   Dg Chest Portable 1 View  Result Date: 07/10/2016 CLINICAL DATA:  Right side chest pain EXAM: PORTABLE CHEST 1 VIEW COMPARISON:  06/09/2016 FINDINGS: Cardiomegaly is noted. No infiltrate or pleural effusion. No pulmonary edema. IMPRESSION: Cardiomegaly.  No active disease. Electronically Signed   By: Lahoma Crocker M.D.   On: 07/10/2016 16:00   Ct Angio Chest Aorta W/cm &/or Wo/cm  Result Date: 08/06/2016 CLINICAL DATA:  Evaluate for aortic dissection. EXAM: CT ANGIOGRAPHY CHEST WITH CONTRAST TECHNIQUE: Multidetector CT imaging of the chest was performed using the standard protocol during bolus administration of intravenous contrast. Multiplanar CT image reconstructions and MIPs were obtained to evaluate the vascular anatomy. CONTRAST:  100 cc of Isovue 370 COMPARISON:  08/04/2016 and 01/31/2015 FINDINGS: Cardiovascular: Normal heart size. Aortic atherosclerosis noted. The thoracic aorta appears intact. No dissection or aneurysm identified. The arch vessels also appear intact without evidence for dissection. The main pulmonary artery  is patent. No lobar or segmental pulmonary artery filling defects identified. Mediastinum/Nodes: The trachea appears patent and is midline. Normal appearance of the esophagus. Left paratracheal lymph node measures 1.2 cm, unchanged from previous exam. Prominent left hilar lymph node is stable measuring 9 mm, image 81 of series 401. No axillary or supraclavicular adenopathy peer Lungs/Pleura: No pleural fluid identified. Pleural-parenchymal thickening overlying the posterior left lower lobe is identified subpleural nodule within the right middle lobe measures 4 mm, image 42 of series 402. Stable from 01/31/2015. Upper  Abdomen: No acute abnormality. Musculoskeletal: No aggressive lytic or sclerotic bone lesions. Review of the MIP images confirms the above findings. IMPRESSION: 1. No evidence for dissection of the thoracic aorta or arch vessels. 2. Aortic atherosclerosis 3. Prominent mediastinal and hilar lymph nodes are stable from 01/31/2015. 4. 4 mm subpleural nodule within the right middle lobe. Unchanged from 01/31/2015. Likely benign. Electronically Signed   By: Kerby Moors M.D.   On: 08/06/2016 13:41   Ir Thrombectomy Av Fistula W/thrombolysis/pta Inc/shunt/img Left  Result Date: 07/16/2016 INDICATION: 51 year old male with nonfunctioning left upper extremity radial-basilic fistula. The fistula was created April 08, 2016 at [facility]. There was then a revision and bovine patch angioplasty secondary to a hematoma on April 12 2016. The fistula has been used 5 times over the last 2 weeks and has thrombosed today. EXAM: IR THROMBECTOMY AV FISTULA W/THROMBOLYSIS INC/SHUNT/IMG LEFT; IR ULTRASOUND GUIDANCE VASC ACCESS LEFT MEDICATIONS: 6 mg Versed, 200 mcg fentanyl, 3 mg tPA, 50 mg Benadryl, ANESTHESIA/SEDATION: Moderate Sedation Time:  125 The patient was continuously monitored during the procedure by the interventional radiology nurse under my direct supervision. FLUOROSCOPY TIME:  Fluoroscopy Time: 14 minutes 18 seconds (3.1 mGy). COMPLICATIONS: None PROCEDURE: The procedure, risks, benefits, and alternatives were explained to the patient and the patient's family, including bleeding, infection, arterial thrombus, venous thrombus, vessel injury, need for further procedure, need for stenting, contrast reaction, need for catheter placement, cardiopulmonary collapse, death. Questions regarding the procedure were encouraged and answered. The patient understands and consents to the procedure. Ultrasound survey was performed with images stored and sent to PACs. The left upper extremity was prepped and draped  in the usual sterile fashion. Ultrasound guidance was used to access the upper extremity fistula with a micropuncture kit. Exchange was made for a 6 [place] short sheath. A Bentson wire was then a navigate into the venous outflow, with an angled catheter advanced into the venous outflow. Venogram of the left upper extremity demonstrates patency of the venous outflow. Upon withdrawal of the angled catheter, a solution of 2 milligrams of tPA in saline was infused into the outflow portion of the graft. A 5 mm diameter balloon catheter was used to macerate the thrombosed segment. Balloon angioplasty of the stenotic venous segment was performed with 5 mm diameter balloon. Ultrasound guidance was then used access the fistula directed toward the arterial anastomosis. Once the micro sheath was in place, an exchange was made for a 6 [place] short sheath. Bentson wire was placed into the proximal venous outflow and balloon angioplasty of the proximal venous outflow was performed to 5 mm diameter. Glidewire was then navigated across the arterial anastomosis, with an over the wire Fogarty balloon advanced into the artery. The arterial plug was pulled, with passage of the Fogarty balloon twice through the thrombosed anastomosis. Repeat venogram demonstrated no restoration of flow. Repeat balloon angioplasty to 5 mm and than 6 mm of the venous outflow was performed of both the  centrally directed an the peripherally directed sheath. A 5 millimeter x 4 centimeter balloon was used to balloon angioplasty the arterial anastomosis. Repeat fistulogram was then performed, demonstrating embolism into the distal radial artery. 2 mg of tPA was then infused at the embolism, with repeat angiogram demonstrating restoration of flow to the hand. Repeat balloon angioplasty of the venous outflow was then again performed through the centrally directed sheath. Restoration of flow was achieved, although there was discontinuous flow through the axillary  vein. Balloon angioplasty was performed at this segment of the presumed residual thrombus at this site. Complete fistulagram performed through the central vasculature, demonstrating restoration of flow through the fistula and the axillary vein. Palpable thrill was present at the completion of the case. The short sheaths were removed after placement of hemostasis sutures. Patient tolerated the procedure well and remained hemodynamically stable throughout. No complications were encountered and no significant blood loss was encountered. IMPRESSION: Status post pharmacologic and mechanical thrombectomy/ thrombolysis of occluded left radio basilic dialysis fistula, with sequential balloon angioplasty of proximal venous outflow stenosis to 5 mm and 6 mm diameter and restoration of flow. Signed, Dulcy Fanny. Earleen Newport, DO [facility] Radiology ACCESS: If there is early re- thrombosis of the fistula, tunneled hemodialysis catheter would be considered the next step as well as referral for surgical evaluation of alternative circuit. Electronically Signed   By: Corrie Mckusick D.O.   On: 07/16/2016 12:43   Ct Angio Chest/abd/pel For Dissection W And/or Wo Contrast  Result Date: 08/04/2016 CLINICAL DATA:  Back pain and concern for aortic dissection EXAM: CT ANGIOGRAPHY CHEST, ABDOMEN AND PELVIS TECHNIQUE: Multidetector CT imaging through the chest, abdomen and pelvis was performed using the standard protocol during bolus administration of intravenous contrast. Multiplanar reconstructed images and MIPs were obtained and reviewed to evaluate the vascular anatomy. CONTRAST:  100 mL Isovue 370 IV COMPARISON:  None. FINDINGS: CTA CHEST FINDINGS Cardiovascular: The heart is enlarged. There is a small pericardial effusion measuring 6 mm. There is a normal variant aortic arch branching pattern with the brachiocephalic and left common carotid arteries sharing a common origin. Precontrast  imaging shows no intramural hematoma of the thoracic aorta. There is aortic atherosclerosis. There is a small there is a small outpouching from the inferior aspect of the aortic arch just distal to the origin of the left subclavian artery. There is a linear opacity within the proximal brachiocephalic artery (coronal image 50). Mediastinum/Nodes: No mediastinal, hilar or axillary lymphadenopathy. The visualized thyroid and thoracic esophageal course are unremarkable. Lungs/Pleura: Lungs are clear. No pleural effusion or pneumothorax. Musculoskeletal: No chest wall abnormality. No acute or significant osseous findings. Review of the MIP images confirms the above findings. CTA ABDOMEN AND PELVIS FINDINGS VASCULAR Aorta: Normal caliber aorta without aneurysm, dissection, vasculitis or significant stenosis. Celiac: Patent without evidence of aneurysm, dissection, vasculitis or significant stenosis. SMA: Patent without evidence of aneurysm, dissection, vasculitis or significant stenosis. Renals: There is atherosclerotic narrowing at the origins of both renal arteries. IMA: Patent without evidence of aneurysm, dissection, vasculitis or significant stenosis. Inflow: There is a small focal dissection of the left common iliac artery. There is moderate to severe stenosis at the origin of the left internal iliac artery. There is extensive atherosclerotic calcification of the left internal iliac artery. There is no aneurysm or severe stenosis of the right iliac arteries. Review of the MIP images confirms the above findings. NON-VASCULAR Hepatobiliary: No focal liver abnormality is seen. No gallstones, gallbladder wall  thickening, or biliary dilatation. Pancreas: Unremarkable. No pancreatic ductal dilatation or surrounding inflammatory changes. Spleen: Normal in size without focal abnormality. Adrenals/Urinary Tract: The adrenal glands are normal. Both kidneys are severely atrophic with multiple renal cysts. The largest cyst  measures 4.2 cm. Stomach/Bowel: No dilated small bowel or other evidence of obstruction. No enteric or colonic inflammation. Lymphatic: No abdominal or pelvic lymphadenopathy. Reproductive: Unremarkable prostate and seminal vesicles. Other: Soft tissue mass in the left iliac fossa is suspected to be a failed renal transplant. Musculoskeletal: There is no bony spinal canal stenosis. No lytic or blastic lesions. Review of the MIP images confirms the above findings. IMPRESSION: 1. No aortic dissection. 2. Linear defect along wall of the proximal brachiocephalic artery as it arises from the aortic arch. While this may be secondary to motion, no definite motion could be identified in adjacent structures. This could indicate a short segment dissection. A cardiac gated study might be helpful to determine if this is a true finding or motion artifact. 3. Focal outpouching from the undersurface of the aortic arch is in the typical location of the ductus diverticulum, which is the favored diagnosis. A small pseudo aneurysm or penetrating ulcer is considered less likely. 4. Small focal dissection within the proximal left common iliac artery. 5. Extensive aortic and iliac atherosclerosis. Severe narrowing of the left internal iliac artery secondary to calcified and noncalcified plaque. 6. Soft tissue mass in the left iliac fossa is likely a failed renal transplant. Electronically Signed   By: Ulyses Jarred M.D.   On: 08/04/2016 05:22    (Echo, Carotid, EGD, Colonoscopy, ERCP)    Subjective:  T pleasant oriented no apparent distress Tolerating diet with some pain in his back and in his abdomen No nausea no vomiting Having breakfast  Discharge Exam: Vitals:   08/07/16 2003 08/08/16 0644  BP: 135/83 125/86  Pulse: 78 74  Resp: 18 19  Temp: 98.6 F (37 C) 98.5 F (36.9 C)   Vitals:   08/07/16 1838 08/07/16 2003 08/07/16 2007 08/08/16 0644  BP: 139/83 135/83  125/86  Pulse: 69 78  74  Resp: _0 Temp: 98 F (36.7 C) 98.6 F (37 C)  98.5 F (36.9 C)  TempSrc: Oral Oral  Oral  SpO2: 95% 100%  100%  Weight:   72.1 kg (159 lb)   Height:        General: Pt is alert, awake, not in acute distress Cardiovascular: RRR, S1/S2 +, no rubs, no gallops Respiratory: CTA bilaterally, no wheezing, no rhonchi     The results of significant diagnostics from this hospitalization (including imaging, microbiology, ancillary and laboratory) are listed below for reference.     Microbiology: Recent Results (from the past 240 hour(s))  MRSA PCR Screening     Status: Abnormal   Collection Time: 08/04/16  7:50 PM  Result Value Ref Range Status   MRSA by PCR POSITIVE (A) NEGATIVE Final    Comment:        The GeneXpert MRSA Assay (FDA approved for NASAL specimens only), is one component of a comprehensive MRSA colonization surveillance program. It is not intended to diagnose MRSA infection nor to guide or monitor treatment for MRSA infections. RESULT CALLED TO, READ BACK BY AND VERIFIED WITH: SARINE,R RN 2229 08/04/16 MITCHELL,L      Labs: BNP (last 3 results)  Recent Labs  09/24/15 0324  BNP >7,673.4*   Basic Metabolic Panel:  Recent Labs Lab 08/04/16 0232 08/05/16 1504  08/05/16 1642 08/06/16 0619 08/07/16 0710  NA 141 137 134* 134* 132*  K 5.1 >7.5* >7.5* 4.5 4.7  CL 99* 98* 97* 94* 92*  CO2 _0 GLUCOSE 67 69 68 62* 89  BUN 26* 38* 40* 17 30*  CREATININE 9.65* 12.86* 13.65* 8.13* 11.47*  CALCIUM 8.2* 8.0* 7.8* 8.3* 7.9*  MG  --  1.9  --  1.8 2.1  PHOS  --  5.1* 5.2* 5.7* 5.8*   Liver Function Tests:  Recent Labs Lab 08/04/16 0232 08/05/16 1504 08/05/16 1642 08/06/16 0619 08/07/16 0710  AST 20 25  --   --   --   ALT 10* 6*  --   --   --   ALKPHOS 155* 132*  --   --   --   BILITOT 0.4 0.9  --   --   --   PROT 7.3 6.8  --   --   --   ALBUMIN 3.1* 2.5* 2.6* 2.9* 2.7*    Recent Labs Lab 08/04/16 0232 08/06/16 0900  LIPASE 83* 21  AMYLASE   --  124*   No results for input(s): AMMONIA in the last 168 hours. CBC:  Recent Labs Lab 08/04/16 0232 08/05/16 1504 08/06/16 0619 08/07/16 0710  WBC 5.5 6.7 5.6 4.1  NEUTROABS 3.3 4.8 3.4 2.2  HGB 8.5* 7.5* 8.3* 8.0*  HCT 26.9* 23.8* 26.4* 25.1*  MCV 87.3 86.9 85.7 85.7  PLT 214 238 220 223   Cardiac Enzymes:  Recent Labs Lab 08/05/16 1530 08/05/16 1642 08/06/16 0619  TROPONINI 0.04* 0.04* 0.05*   BNP: Invalid input(s): POCBNP CBG:  Recent Labs Lab 08/07/16 0809 08/07/16 1218 08/07/16 1835 08/07/16 2007 08/08/16 0741  GLUCAP 142* 133* 158* 107* 83   D-Dimer No results for input(s): DDIMER in the last 72 hours. Hgb A1c No results for input(s): HGBA1C in the last 72 hours. Lipid Profile No results for input(s): CHOL, HDL, LDLCALC, TRIG, CHOLHDL, LDLDIRECT in the last 72 hours. Thyroid function studies No results for input(s): TSH, T4TOTAL, T3FREE, THYROIDAB in the last 72 hours.  Invalid input(s): FREET3 Anemia work up No results for input(s): VITAMINB12, FOLATE, FERRITIN, TIBC, IRON, RETICCTPCT in the last 72 hours. Urinalysis    Component Value Date/Time   COLORURINE YELLOW 10/18/2013 [place] 10/18/2013 0419   LABSPEC 1.008 10/18/2013 0419   PHURINE 8.5 (H) 10/18/2013 0419   GLUCOSEU 100 (A) 10/18/2013 0419   HGBUR TRACE (A) 10/18/2013 0419   BILIRUBINUR NEGATIVE 10/18/2013 0419   KETONESUR NEGATIVE 10/18/2013 0419   PROTEINUR 100 (A) 10/18/2013 0419   UROBILINOGEN 0.2 10/18/2013 0419   NITRITE NEGATIVE 10/18/2013 0419   LEUKOCYTESUR NEGATIVE 10/18/2013 0419   Sepsis Labs Invalid input(s): PROCALCITONIN,  WBC,  LACTICIDVEN Microbiology Recent Results (from the past 240 hour(s))  MRSA PCR Screening     Status: Abnormal   Collection Time: 08/04/16  7:50 PM  Result Value Ref Range Status   MRSA by PCR POSITIVE (A) NEGATIVE Final    Comment:        The GeneXpert MRSA Assay (FDA approved for NASAL specimens only), is one  component of a comprehensive MRSA colonization surveillance program. It is not intended to diagnose MRSA infection nor to guide or monitor treatment for MRSA infections. RESULT CALLED TO, READ BACK BY AND VERIFIED WITH: Lorraine Lax RN 2229 08/04/16 MITCHELL,L      Time coordinating discharge: Over 30 minutes  SIGNED:   Nita Sells, MD  [facility] 08/08/2016, 9:26 AM Pager   If 7PM-7AM, please contact night-coverage [email or web address] Password [email or web address]

## 2016-08-09 ENCOUNTER — Telehealth: Payer: Self-pay | Admitting: Vascular Surgery

## 2016-08-09 ENCOUNTER — Other Ambulatory Visit: Payer: Self-pay | Admitting: Internal Medicine

## 2016-08-09 ENCOUNTER — Telehealth: Payer: Self-pay | Admitting: Internal Medicine

## 2016-08-09 DIAGNOSIS — F411 Generalized anxiety disorder: Secondary | ICD-10-CM

## 2016-08-09 MED ORDER — HYDROXYZINE HCL 25 MG PO TABS
25.0000 mg | ORAL_TABLET | Freq: Two times a day (BID) | ORAL | 3 refills | Status: DC | PRN
Start: 2016-08-09 — End: 2016-08-13

## 2016-08-09 MED ORDER — ACETAMINOPHEN-CODEINE #3 300-30 MG PO TABS: 1.0000 | ORAL_TABLET | Freq: Four times a day (QID) | ORAL | 0 refills | Status: DC | PRN

## 2016-08-09 NOTE — Telephone Encounter (Signed)
Patient came to the office to speak with PCP regarding his pain medication and anxiety medication. Pt needs a refill on them. Please follow up.   Thank you.

## 2016-08-09 NOTE — Telephone Encounter (Signed)
Sched CTA 09/02/16 at 10:00 at [place]. Sched MD 09/13/16 at 10:30. Pt's ph# not working, lm on emergency contact's # to inform them of appts.

## 2016-08-09 NOTE — Telephone Encounter (Signed)
-----  Message from Mena Goes, RN sent at 08/08/2016 10:55 AM EST ----- Regarding: FW: may be duplicate request   ----- Message ----- From: Mena Goes, RN Sent: 08/08/2016  10:15 AM To:  Subject: may be duplicate request                         ----- Message ----- From: Gabriel Earing, PA-C Sent: 08/08/2016   9:19 AM To: Vvs Charge Pool  Has innominate dissection. Needs to f/u with Dr. Donzetta Matters in 4-6 weeks with CTA of the chest.  Thanks, Aldona Bar

## 2016-08-10 DIAGNOSIS — D631 Anemia in chronic kidney disease: Secondary | ICD-10-CM | POA: Diagnosis not present

## 2016-08-10 DIAGNOSIS — N2581 Secondary hyperparathyroidism of renal origin: Secondary | ICD-10-CM | POA: Diagnosis not present

## 2016-08-10 DIAGNOSIS — N186 End stage renal disease: Secondary | ICD-10-CM | POA: Diagnosis not present

## 2016-08-12 DIAGNOSIS — D631 Anemia in chronic kidney disease: Secondary | ICD-10-CM | POA: Diagnosis not present

## 2016-08-12 DIAGNOSIS — N186 End stage renal disease: Secondary | ICD-10-CM | POA: Diagnosis not present

## 2016-08-12 DIAGNOSIS — N2581 Secondary hyperparathyroidism of renal origin: Secondary | ICD-10-CM | POA: Diagnosis not present

## 2016-08-13 ENCOUNTER — Encounter (HOSPITAL_COMMUNITY): Payer: Self-pay | Admitting: Emergency Medicine

## 2016-08-13 ENCOUNTER — Emergency Department (HOSPITAL_COMMUNITY)
Admission: EM | Admit: 2016-08-13 | Discharge: 2016-08-13 | Disposition: A | Discharge status: Home / Self Care | Payer: Medicare Other | Attending: Emergency Medicine | Admitting: Emergency Medicine

## 2016-08-13 ENCOUNTER — Ambulatory Visit (INDEPENDENT_AMBULATORY_CARE_PROVIDER_SITE_OTHER): Payer: Medicare Other | Admitting: Physician Assistant

## 2016-08-13 VITALS — BP 162/98 | HR 85 | Temp 98.1°F | Resp 16 | Ht 73.0 in | Wt 170.0 lb

## 2016-08-13 DIAGNOSIS — N186 End stage renal disease: Secondary | ICD-10-CM | POA: Insufficient documentation

## 2016-08-13 DIAGNOSIS — I5043 Acute on chronic combined systolic (congestive) and diastolic (congestive) heart failure: Secondary | ICD-10-CM | POA: Insufficient documentation

## 2016-08-13 DIAGNOSIS — F411 Generalized anxiety disorder: Secondary | ICD-10-CM

## 2016-08-13 DIAGNOSIS — E1122 Type 2 diabetes mellitus with diabetic chronic kidney disease: Secondary | ICD-10-CM | POA: Diagnosis not present

## 2016-08-13 DIAGNOSIS — Z992 Dependence on renal dialysis: Secondary | ICD-10-CM | POA: Diagnosis not present

## 2016-08-13 DIAGNOSIS — I132 Hypertensive heart and chronic kidney disease with heart failure and with stage 5 chronic kidney disease, or end stage renal disease: Secondary | ICD-10-CM | POA: Diagnosis not present

## 2016-08-13 DIAGNOSIS — Z87891 Personal history of nicotine dependence: Secondary | ICD-10-CM | POA: Insufficient documentation

## 2016-08-13 DIAGNOSIS — G4700 Insomnia, unspecified: Secondary | ICD-10-CM

## 2016-08-13 DIAGNOSIS — F419 Anxiety disorder, unspecified: Secondary | ICD-10-CM

## 2016-08-13 MED ORDER — ALPRAZOLAM 1 MG PO TABS: 1.0000 mg | ORAL_TABLET | Freq: Every evening | ORAL | 0 refills | Status: DC | PRN

## 2016-08-13 NOTE — Progress Notes (Signed)
Frank [name]  MRN: [id number] DOB: Dec 21, 1965  Subjective:  Frank [name] is a 51 y.o. male with hx of ESRD currently on dialysis, seen in office today for a chief complaint of anxiety. Has been struggling with anxiety for the past four years. Notes he continues to worry about unnecessary things and then spirals down.  States he has been controlled on xanax 75m and klonipin 122mdaily for 3 months. Has tried prozac for 2 weeks and that caused nausea so he stopped taking it. He also tried hydroxyzine and did not respond well. He is out of klonipin and xanax now for about one month. Pt [facility] he will not give him refills anymore and was told he needed to find a PCP to give him refills. Today, he is experiencing nausea, insomnia (has not slept in 4 days), shaking, and increased heart rate. Denies headache, chest pain, and seizure. Of note, pt is also supposed to seen by pain clinic soon for chronic back pain  Review of Systems  Constitutional: Negative for chills, fatigue and fever.  Respiratory: Negative for cough and shortness of breath.   Cardiovascular: Negative for leg swelling.  Genitourinary: Negative for hematuria.  Musculoskeletal: Positive for back pain.  Psychiatric/Behavioral: Negative for dysphoric mood, self-injury and suicidal ideas.    Patient Active Problem List   Diagnosis Date Noted  . Hypertensive emergency   . Dissection of other specified artery (HCOmao01/14/2018  . Abdominal pain 08/04/2016  . End-stage renal disease on hemodialysis (HCLeoti  . Subtherapeutic international normalized ratio (INR)   . Hypertension   . Problem with dialysis access ([facility]12/08/2015  . ESRD (end stage renal disease) on dialysis ([facility]  . ESRD (end stage renal disease) (HCChrisman11/30/2017  . ESRD needing dialysis (HCValatie11/24/2017  . Uremia 06/07/2016  . Hyperkalemia 05/29/2016  . Chronic back pain 05/29/2016  . GERD  (gastroesophageal reflux disease) 05/29/2016  . Personal history of DVT (deep vein thrombosis)/ PE 05/26/2016  . Bradycardia 03/31/2016  . Nonischemic cardiomyopathy (HCPomona06/20/2017  . Bilateral low back pain without sciatica   . Left renal mass 10/30/2015  . Constipation 10/30/2015  . Hypertensive urgency 09/08/2015  . Adjustment disorder with mixed anxiety and depressed mood 08/20/2015  . Chronic epididymitis 06/19/2015  . Groin pain, chronic, right 06/19/2015  . Incarcerated right inguinal hernia 02/16/2015  . Essential hypertension 01/02/2015  . Dyslipidemia   . ESRD on hemodialysis (HCDonalson[place]  . Anemia of chronic kidney failure 06/24/2013    Current Outpatient Prescriptions on File Prior to Visit  Medication Sig Dispense Refill  . acetaminophen-codeine (TYLENOL #3) 300-30 MG tablet Take 1 tablet by mouth every 6 (six) hours as needed. 60 tablet 0  . allopurinol (ZYLOPRIM) 100 MG tablet Take 100 mg by mouth daily.    . Marland KitchenmLODipine (NORVASC) 5 MG tablet Take 1 tablet (5 mg total) by mouth at bedtime. 30 tablet 1  . atorvastatin (LIPITOR) 40 MG tablet Take 1 tablet (40 mg total) by mouth daily at 6 PM. 30 tablet 1  . carvedilol (COREG) 25 MG tablet Take 1 tablet (25 mg total) by mouth 2 (two) times daily with a meal. 30 tablet 1  . cinacalcet (SENSIPAR) 30 MG tablet Take 30 mg by mouth every evening.     . cloNIDine (CATAPRES - DOSED IN MG/24 HR) 0.3 mg/24hr patch Place 1 patch (0.3 mg total) onto the  skin every Monday. 4 patch 12  . doxazosin (CARDURA) 2 MG tablet Take 1 tablet (2 mg total) by mouth daily. 30 tablet 1  . famotidine (PEPCID) 20 MG tablet Take 1 tablet (20 mg total) by mouth 2 (two) times daily. 30 tablet 0  . gabapentin (NEURONTIN) 100 MG capsule Take 1 capsule (100 mg total) by mouth at bedtime. (Patient taking differently: Take 100 mg by mouth 3 (three) times daily. )    . hydrALAZINE (APRESOLINE) 100 MG tablet Take 1 tablet (100 mg total) by mouth 2 (two) times daily.  30 tablet 1  . HYDROcodone-acetaminophen (NORCO/VICODIN) 5-325 MG tablet Take 1 tablet by mouth every 6 (six) hours as needed for moderate pain.    . isosorbide mononitrate (IMDUR) 60 MG 24 hr tablet Take 1 tablet (60 mg total) by mouth daily. 30 tablet 1  . methocarbamol (ROBAXIN) 500 MG tablet Take 1 tablet (500 mg total) by mouth 2 (two) times daily as needed for muscle spasms. 10 tablet 0  . omeprazole (PRILOSEC) 20 MG capsule Take 1 capsule (20 mg total) by mouth daily. 30 capsule 1  . ondansetron (ZOFRAN ODT) 4 MG disintegrating tablet Take 1 tablet (4 mg total) by mouth every 8 (eight) hours as needed for nausea or vomiting. 10 tablet 0  . oxycodone (OXY-IR) 5 MG capsule Take 1 capsule (5 mg total) by mouth every 4 (four) hours as needed for pain. 5 capsule 0  . oxyCODONE-acetaminophen (PERCOCET/ROXICET) 5-325 MG tablet Take 1 tablet by mouth every 6 (six) hours as needed for severe pain.    [name] senna-docusate (SENOKOT-S) 8.6-50 MG tablet Take 1 tablet by mouth at bedtime as needed for mild constipation. 30 tablet 0  . sevelamer carbonate (RENVELA) 800 MG tablet Take 1,600 mg by mouth 3 (three) times daily with meals.      No current facility-administered medications on file prior to visit.     Allergies  Allergen Reactions  . Butalbital-Apap-Caffeine Shortness Of Breath, Swelling and Other (See Comments)    Swelling in throat  . Ferrlecit [Na Ferric Gluc Cplx In Sucrose] Shortness Of Breath, Swelling and Other (See Comments)    Swelling in throat  . Minoxidil Shortness Of Breath  . Tylenol [Acetaminophen] Anaphylaxis and Swelling  . Darvocet [Propoxyphene N-Acetaminophen] Hives     Objective:  BP (!) 162/98 (BP Location: Right Arm, Patient Position: Sitting, Cuff Size: Normal)   Pulse 85   Temp 98.1 F (36.7 C) (Oral)   Resp 16   Ht _0  (1.854 m)   Wt 170 lb (77.1 kg)   SpO2 98%   BMI 22.43 kg/m   Physical Exam  Constitutional: He is oriented to person, place, and time.  He appears unhealthy.  HENT:  Head: Normocephalic and atraumatic.  Eyes: Right conjunctiva is injected. Left conjunctiva is injected.  Neck: Normal range of motion.  Cardiovascular: Normal rate, regular rhythm and normal heart sounds.   Pulmonary/Chest: Effort normal.  Neurological: He is alert and oriented to person, place, and time. He displays tremor (of hands bilaterally). Gait normal.  Skin: Skin is warm and dry.  Psychiatric: Affect normal. His mood appears anxious. He does not exhibit a depressed mood. He expresses no suicidal plans.  Vitals reviewed.  Pt was looked up in the [place] Controlled Substance database and it does not appear that he has received a prescription for 42m xanax or 177mklonipin the past 1.5 years. His last prescription for alprazolam was in 09/12/2015 and it  was for 10 tablets of 0.49m.   Assessment and Plan :  This case was precepted with Dr. SBrigitte Pulse  1. Anxiety state 2. Insomnia, unspecified type -Pt is a poor historian and cannot give a good history of who is prescribing his xanax 262mand klonopin 58m69mor anxiety. According to the [facility], he has not received either of these prescriptions at these doses in the past year. His last prescription for alprazolam was in 09/12/2015 and was for 10 tablets of 0.5mg48m-[facility] to complex medical history of ESRD on dialysis with concomitant use of opioids and benzodiazepines for further evaluation.   - Ambulatory referral to Psychiatry   After I discharged patient, [facility]. PA-C Hedges and I discussed patient's case and he just wanted to double check that I sent him to the ED for further evaluation of anxiety. I informed him that I did do so.   BritTenna DelaineC  [facility] 08/13/2016 2:12 PM

## 2016-08-13 NOTE — ED Triage Notes (Signed)
Patient reports he was sent from PCP for increased anxiety and insomnia. States he was sent to get anxiety medication until he is able to follow up with psychiatry. Denies SI/ HI.

## 2016-08-13 NOTE — Patient Instructions (Addendum)
I recommend you go directly to [facility] ER for further treatment on insomnia and for appropriate labs as we do not have the capability of doing the appropriate labs or medication dispense in office.   I have made a referral for psychiatry and they should contact you within the next couple of weeks for an appointment. In the meantime if you feel as if your anxiety is too server you can you can always walk into the places below for treatment of anxiety:  1) [facility]  [address] [place], [place] [address] [place], [place]  [phone]  2)[facility] [address] [place], [place] [address] Hours: Open  Closes 5PM Phone: [phone]   I have discussed your case with Dr. Brigitte Pulse and she is more than willing to help with your primary care. You can schedule an appointment with her to establish care next week. Thank you for letting me participate in your health and well being.    IF you received an x-ray today, you will receive an invoice from [facility]. Please contact [facility] at [phone] with questions or concerns regarding your invoice.   IF you received labwork today, you will receive an invoice from [facility]. Please contact [facility] at [phone] with questions or concerns regarding your invoice.   Our billing staff will not be able to assist you with questions regarding bills from these companies.  You will be contacted with the lab results as soon as they are available. The fastest way to get your results is to activate your My Chart account. Instructions are located on the last page of this paperwork. If you have not heard from [place] regarding the results in 2 weeks, please contact this office.

## 2016-08-13 NOTE — Discharge Instructions (Signed)
Please read attached information. If you experience any new or worsening signs or symptoms please return to the emergency room for evaluation. Please follow-up with your primary care provider or specialist as discussed. Please use medication prescribed only as directed and discontinue taking if you have any concerning signs or symptoms.   °

## 2016-08-13 NOTE — ED Notes (Signed)
Pt verbalizes he does not want to stay for further evaluation/treatment of high BP. Pt reports this is his normal and he will take his hypertension medications once home. Pt made aware of risks of leaving/ not treating blood pressure.

## 2016-08-13 NOTE — ED Provider Notes (Signed)
[facility] DEPT Provider Note   CSN: [id number] Arrival date & time: 08/13/16  1558  By signing my name below, I, Higinio Plan, attest that this documentation has been prepared under the direction and in the presence of [employer], PA-C . Electronically Signed: Higinio Plan, Scribe. 08/13/2016. 6:40 PM.  History   Chief Complaint Chief Complaint  Patient presents with  . Anxiety   The history is provided by the patient. No language interpreter was used.   HPI Comments: Frank [name] is a 51 y.o. male with PMHx of anxiety, depression, ESRD, and DM2, who presents to the Emergency Department for an evaluation of gradually worsening, anxiety that began ~2 weeks ago. Pt reports he visited his PCP this morning for an evaluation of increased anxiety and insomnia. He notes he has been under increased stress for the past 6 months and states he recently lost his brother-in-law ~3 months ago and has "not slept" for the past 4 days. Pt reports he was referred to the ED by his PCP to get anxiety medication until he is able to follow-up with psychiatry. He denies any other complaints.   Past Medical History:  Diagnosis Date  . Anemia   . Anxiety   . Chronic combined systolic and diastolic CHF (congestive heart failure) (HCC)    a. EF 20-25% by echo in 08/2015 b. echo 10/2015: EF 35-40%, diffuse HK, severe LAE, moderate RAE, small pericardial effusion  . Complication of anesthesia    itching, sore throat  . Depression   . Dialysis patient ([place])   . ESRD (end stage renal disease) ([place])    due to HTN per patient, followed at [facility], s/p failed kidney transplant - dialysis Tue, Th, Sat  . Hyperkalemia 12/2015  . Hypertension   . Junctional rhythm    a. noted in 08/2015: hyperkalemic at that time  b. 12/2015: presented in junctional rhythm w/ K+ of 6.6. Resolved with improvement of K+ levels.  . Nonischemic cardiomyopathy ([place])    a. 08/2014: cath showing minimal CAD, but tortuous arteries noted.    . Personal history of DVT (deep vein thrombosis)/ PE 05/26/2016   In Oct 2015 had small subsemental LUL PE w/o DVT (LE dopplers neg) and was felt to be HD cath related, treated w coumadin.  IN May 2016 had small vein DVT (acute/subacute) in the R basilic/ brachial veins of the RUE, resumed on coumadin.  Had R sided HD cath at that time.    . Renal insufficiency   . Shortness of breath   . Type II diabetes mellitus (HCC)    No history per patient, but remains under history as A1c would not be accurate given on dialysis    Patient Active Problem List   Diagnosis Date Noted  . Hypertensive emergency   . Dissection of other specified artery ([place]) 08/04/2016  . Abdominal pain 08/04/2016  . End-stage renal disease on hemodialysis ([place])   . Subtherapeutic international normalized ratio (INR)   . Hypertension   . Problem with dialysis access ([place]) 06/22/2016  . ESRD (end stage renal disease) on dialysis ([place])   . ESRD (end stage renal disease) ([place]) 06/20/2016  . ESRD needing dialysis ([place]) 06/14/2016  . Uremia 06/07/2016  . Hyperkalemia 05/29/2016  . Chronic back pain 05/29/2016  . GERD (gastroesophageal reflux disease) 05/29/2016  . Personal history of DVT (deep vein thrombosis)/ PE 05/26/2016  . Bradycardia 03/31/2016  . Nonischemic cardiomyopathy ([place]) 01/09/2016  . Bilateral low back pain without sciatica   .  Left renal mass 10/30/2015  . Constipation 10/30/2015  . Acute on chronic combined systolic and diastolic congestive heart failure ([place]) 09/23/2015  . Hypertensive urgency 09/08/2015  . Adjustment disorder with mixed anxiety and depressed mood 08/20/2015  . Chronic epididymitis 06/19/2015  . Groin pain, chronic, right 06/19/2015  . Incarcerated right inguinal hernia 02/16/2015  . Essential hypertension 01/02/2015  . Dyslipidemia   . ESRD on hemodialysis ([place])   . Anemia of chronic kidney failure 06/24/2013    Past Surgical History:  Procedure Laterality Date  . CAPD  INSERTION    . CAPD REMOVAL    . INGUINAL HERNIA REPAIR Right 02/14/2015   Procedure: REPAIR INCARCERATED RIGHT INGUINAL HERNIA;  Surgeon: [name], MD;  Location: [facility];  Service: General;  Laterality: Right;  . INSERTION OF DIALYSIS CATHETER Right 09/23/2015   Procedure: exchange of Right internal Dialysis Catheter.;  Surgeon: [name], MD;  Location: [facility];  Service: Vascular;  Laterality: Right;  . IR GENERIC HISTORICAL  07/16/2016   IR US GUIDE VASC ACCESS LEFT 07/16/2016 Corrie Mckusick, DO [facility]-INTERV RAD  . IR GENERIC HISTORICAL Left 07/16/2016   IR THROMBECTOMY AV FISTULA W/THROMBOLYSIS/PTA INC/SHUNT/IMG LEFT 07/16/2016 Corrie Mckusick, DO [facility]-INTER[facility] RAD  . KIDNEY RECEIPIENT  2006   failed and started HD in March 2014  . LEFT HEART CATHETERIZATION WITH CORONARY ANGIOGRAM N/A 09/02/2014   Procedure: LEFT HEART CATHETERIZATION WITH CORONARY ANGIOGRAM;  Surgeon: [name], MD;  Location: [facility] CATH LAB;  Service: Cardiovascular;  Laterality: N/A;    Home Medications    Prior to Admission medications   Medication Sig Start Date End Date Taking? Authorizing Provider  acetaminophen-codeine (TYLENOL #3) 300-30 MG tablet Take 1 tablet by mouth every 6 (six) hours as needed. 08/09/16   Tresa Garter, MD  allopurinol (ZYLOPRIM) 100 MG tablet Take 100 mg by mouth daily.    Historical Provider, MD  ALPRAZolam [name]) 1 MG tablet Take 1 tablet (1 mg total) by mouth at bedtime as needed for sleep. 08/13/16   Okey Regal, PA-C  amLODipine (NORVASC) 5 MG tablet Take 1 tablet (5 mg total) by mouth at bedtime. 08/08/16   Nita Sells, MD  atorvastatin (LIPITOR) 40 MG tablet Take 1 tablet (40 mg total) by mouth daily at 6 PM. 09/02/14   Barton Dubois, MD  carvedilol (COREG) 25 MG tablet Take 1 tablet (25 mg total) by mouth 2 (two) times daily with a meal. 08/08/16   Nita Sells, MD  cinacalcet (SENSIPAR) 30 MG tablet Take 30 mg by mouth every evening.     Historical Provider, MD    clonazePAM (KLONOPIN) 1 MG tablet Take 1 mg by mouth 2 (two) times daily.    Historical Provider, MD  cloNIDine (CATAPRES - DOSED IN MG/24 HR) 0.3 mg/24hr patch Place 1 patch (0.3 mg total) onto the skin every Monday. 08/12/16   Nita Sells, MD  doxazosin (CARDURA) 2 MG tablet Take 1 tablet (2 mg total) by mouth daily. 08/08/16   Nita Sells, MD  famotidine (PEPCID) 20 MG tablet Take 1 tablet (20 mg total) by mouth 2 (two) times daily. 08/08/16   Nita Sells, MD  gabapentin (NEURONTIN) 100 MG capsule Take 1 capsule (100 mg total) by mouth at bedtime. Patient taking differently: Take 100 mg by mouth 3 (three) times daily.  05/21/16   [name], DO  hydrALAZINE (APRESOLINE) 100 MG tablet Take 1 tablet (100 mg total) by mouth 2 (two) times daily. 08/08/16   Jai-Gurmukh  Samtani, MD  HYDROcodone-acetaminophen (NORCO/VICODIN) 5-325 MG tablet Take 1 tablet by mouth every 6 (six) hours as needed for moderate pain.    Historical Provider, MD  isosorbide mononitrate (IMDUR) 60 MG 24 hr tablet Take 1 tablet (60 mg total) by mouth daily. 08/08/16   Nita Sells, MD  methocarbamol (ROBAXIN) 500 MG tablet Take 1 tablet (500 mg total) by mouth 2 (two) times daily as needed for muscle spasms. 08/08/16   Nita Sells, MD  omeprazole (PRILOSEC) 20 MG capsule Take 1 capsule (20 mg total) by mouth daily. 08/08/16   Nita Sells, MD  ondansetron (ZOFRAN ODT) 4 MG disintegrating tablet Take 1 tablet (4 mg total) by mouth every 8 (eight) hours as needed for nausea or vomiting. 05/16/16   Sherwood Gambler, MD  oxycodone (OXY-IR) 5 MG capsule Take 1 capsule (5 mg total) by mouth every 4 (four) hours as needed for pain. 05/30/16   [name], MD  oxyCODONE-acetaminophen (PERCOCET/ROXICET) 5-325 MG tablet Take 1 tablet by mouth every 6 (six) hours as needed for severe pain.    Historical Provider, MD  senna-docusate (SENOKOT-S) 8.6-50 MG tablet Take 1 tablet by mouth at bedtime as  needed for mild constipation. 08/08/16   Nita Sells, MD  sevelamer carbonate (RENVELA) 800 MG tablet Take 1,600 mg by mouth 3 (three) times daily with meals.     Historical Provider, MD    Family History Family History  Problem Relation Age of Onset  . Hypertension Other     Social History Social History  Substance Use Topics  . Smoking status: Former Smoker    Packs/day: 0.00    Years: 1.00    Types: Cigarettes  . Smokeless tobacco: Never Used     Comment: quit Jan 2014  . Alcohol use No     Allergies   Butalbital-apap-caffeine; Ferrlecit [na ferric gluc cplx in sucrose]; Minoxidil; Tylenol [acetaminophen]; and Darvocet [propoxyphene n-acetaminophen]   Review of Systems Review of Systems  Constitutional: Negative for fever.  Psychiatric/Behavioral: Positive for sleep disturbance.       +anxiety   Physical Exam Updated Vital Signs BP (!) 194/118 (BP Location: Right Arm)   Pulse 79   Temp 98.6 F (37 C) (Oral)   Resp 18   SpO2 100%   Physical Exam  Constitutional: He is oriented to person, place, and time. He appears well-developed and well-nourished.  HENT:  Head: Normocephalic.  Eyes: EOM are normal.  Neck: Normal range of motion.  Pulmonary/Chest: Effort normal.  Abdominal: He exhibits no distension.  Musculoskeletal: Normal range of motion.  Neurological: He is alert and oriented to person, place, and time.  Psychiatric: He has a normal mood and affect.  Nursing note and vitals reviewed.  ED Treatments / Results  DIAGNOSTIC STUDIES:  Oxygen Saturation is 100% on RA, normal by my interpretation.    COORDINATION OF CARE:  6:31 PM Discussed treatment plan with pt at bedside and pt agreed to plan.  Radiology No results found.  Procedures Procedures (including critical care time)  Medications Ordered in ED Medications - No data to display  Initial Impression / Assessment and Plan / ED Course  I have reviewed the triage vital signs and  the nursing notes.  Pertinent labs & imaging results that were available during my care of the patient were reviewed by me and considered in my medical decision making (see chart for details).  Labs:   Imaging:   Consults:   Therapeutics:   Discharge Meds:  Assessment/Plan:  51 year old male presents today for the evaluation of anxiety. Patient was seen by primary care today and referred to the emergency room for evaluation. Patient has history of anxiety, not currently taking any benzodiazepines. Patient notes that he's had increasing stress and anxiety and difficulty sleeping. Patient has outpatient follow-up with psychiatry. He will be a short course of anti-anxiety medication to help with sleep until follow-up with psychiatry. I informed patient that we would not refill his medication again in the emergency room and he would need to follow up with primary care or psychiatrist for reevaluation. Patient verbalizes understanding and agreement to today's plan had no further questions or concerns at the time discharge   Final Clinical Impressions(s) / ED Diagnoses   Final diagnoses:  Anxiety    New Prescriptions Discharge Medication List as of 08/13/2016  6:46 PM       Okey Regal, PA-C 08/13/16 1911    Duffy Bruce, MD 08/15/16 1152

## 2016-08-14 ENCOUNTER — Ambulatory Visit: Payer: Self-pay | Admitting: Urology

## 2016-08-14 DIAGNOSIS — D631 Anemia in chronic kidney disease: Secondary | ICD-10-CM | POA: Diagnosis not present

## 2016-08-14 DIAGNOSIS — N186 End stage renal disease: Secondary | ICD-10-CM | POA: Diagnosis not present

## 2016-08-14 DIAGNOSIS — N2581 Secondary hyperparathyroidism of renal origin: Secondary | ICD-10-CM | POA: Diagnosis not present

## 2016-08-15 ENCOUNTER — Telehealth: Payer: Self-pay

## 2016-08-15 IMAGING — CT CT HEAD W/O CM
2 series · 21 of 30 positions shown, 25 images · non-contrast
Comparison: 08/31/2012

CLINICAL DATA: Severe headaches for 4 days. Hypertension.
Dizziness.

EXAM:
CT HEAD WITHOUT CONTRAST
TECHNIQUE: Contiguous axial images were obtained from the base of the skull
through the vertex without intravenous contrast.

[Series 203: head w/o, idose (1) · axial · non-contrast · 0.54mm/px · z∈[+49,+169]mm · 13 of 29 slices shown, 17 images]
[im 3/29  brain]
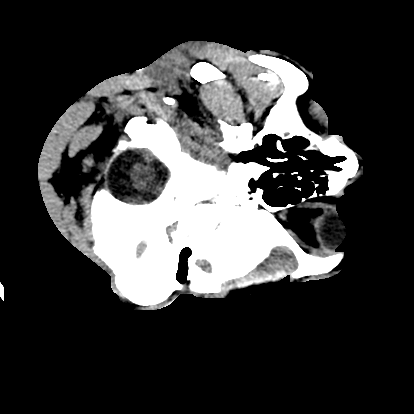
[im 3/29  bone]
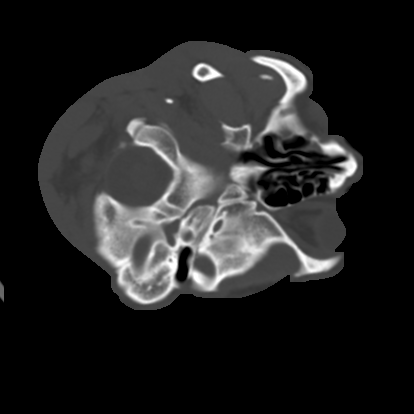
[im 5/29  brain]
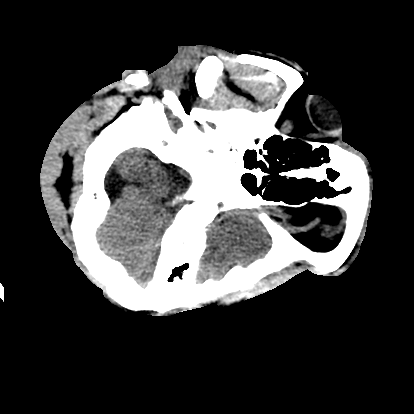
[im 7/29  brain]
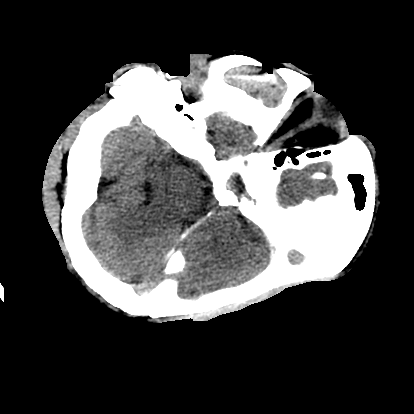
[im 9/29  brain]
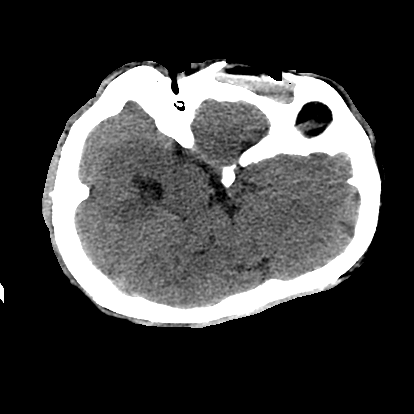
[im 11/29  brain]
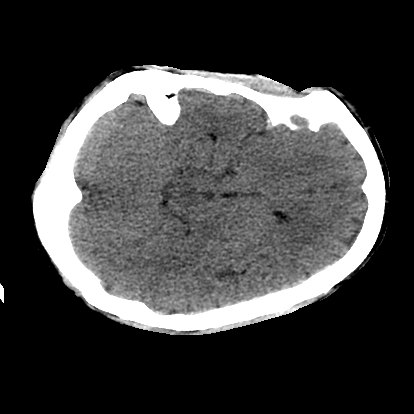
[im 11/29  bone]
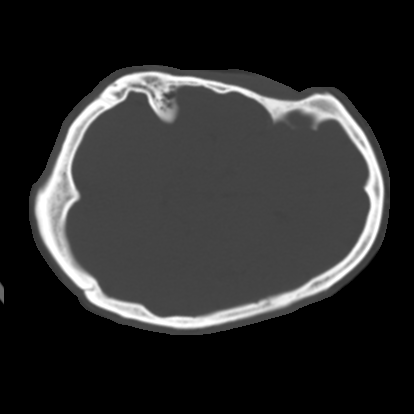
[im 13/29  brain]
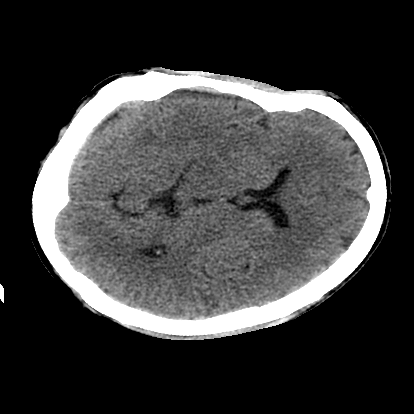
[im 15/29  brain]
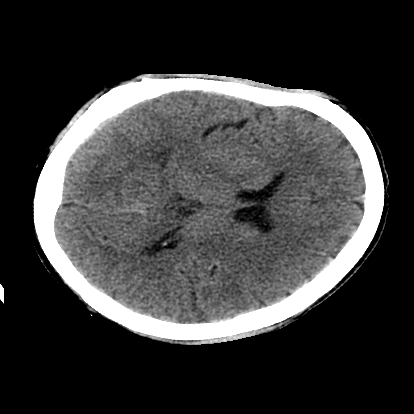
[im 17/29  brain]
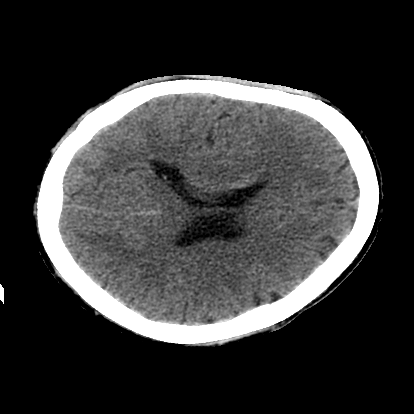
[im 19/29  brain]
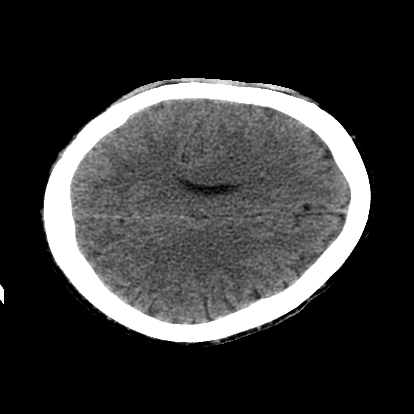
[im 19/29  bone]
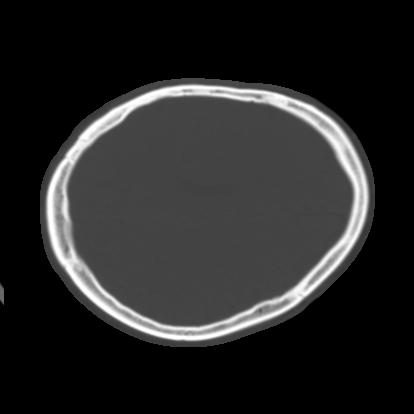
[im 21/29  brain]
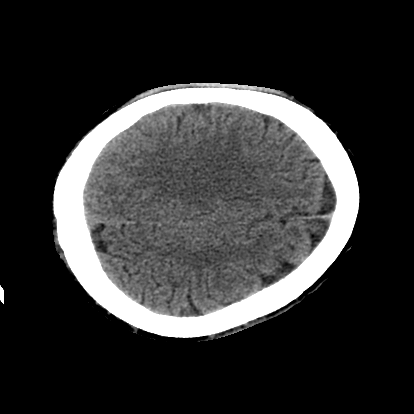
[im 23/29  brain]
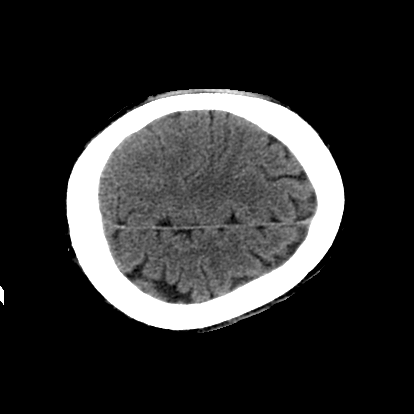
[im 25/29  brain]
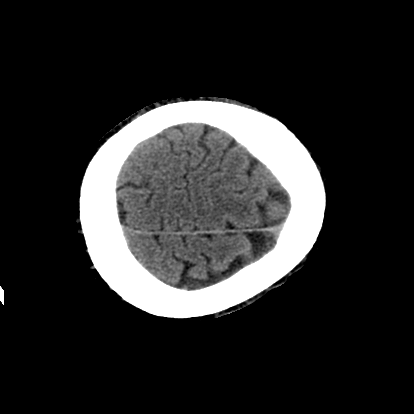
[im 27/29  brain]
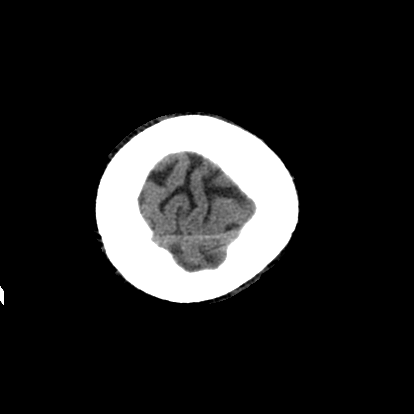
[im 27/29  bone]
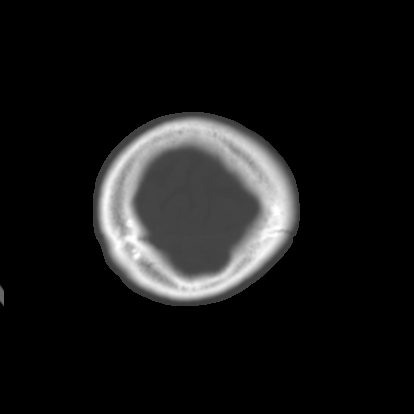

[Series 204: head w/o bone, idose (1) · axial · non-contrast · 0.54mm/px · z∈[+49,+169]mm · 8 of 29 slices shown]
[im 3/29  bone]
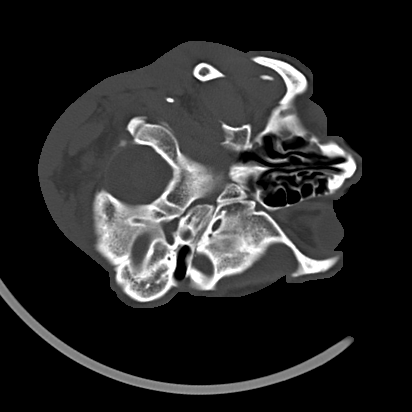
[im 7/29  bone]
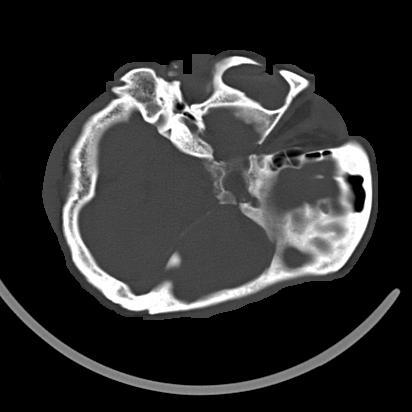
[im 11/29  bone]
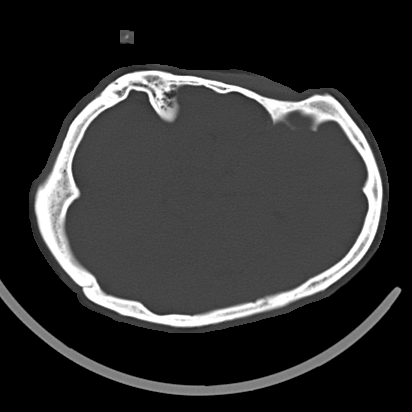
[im 13/29  bone]
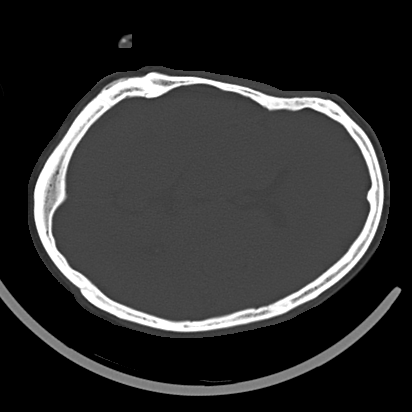
[im 17/29  bone]
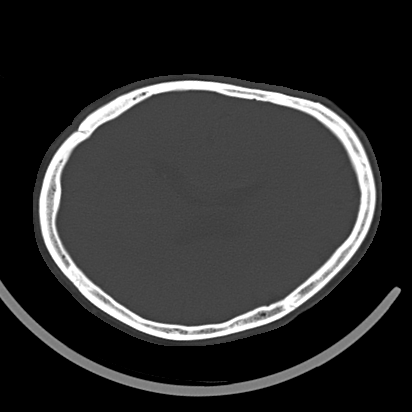
[im 19/29  bone]
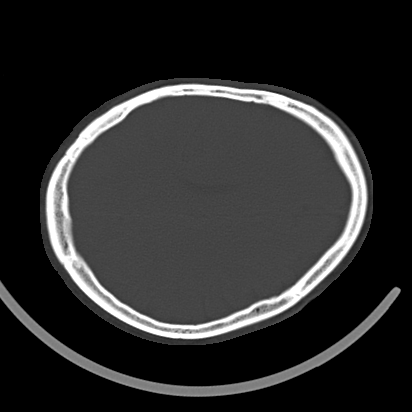
[im 23/29  bone]
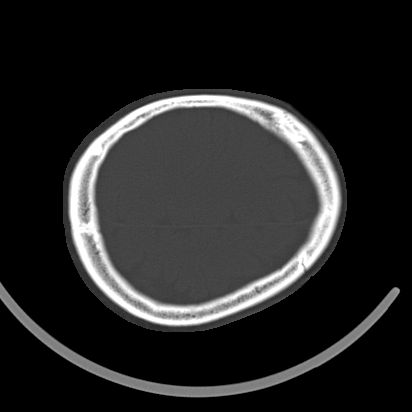
[im 27/29  bone]
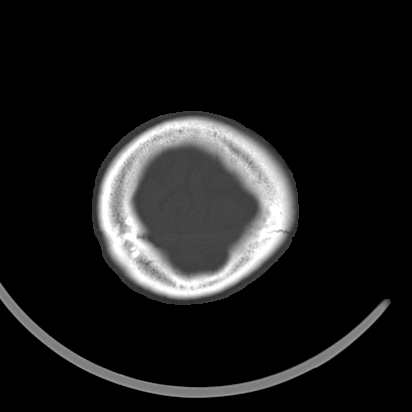

[21 of 30 positions shown; findings below may reference images not displayed]

FINDINGS: Technically limited study due to patient rotation. Ventricles and
sulci appear symmetrical. No mass effect or midline shift. No
abnormal extra-axial fluid collections. Gray-white matter junctions
are distinct. Basal cisterns are not effaced. No evidence of acute
intracranial hemorrhage. No depressed skull fractures. Opacification
of right maxillary antrum.
IMPRESSION: No acute intracranial abnormalities. Inflammatory changes in the
paranasal sinuses.

## 2016-08-15 IMAGING — CR DG CHEST 1V PORT
1 series · 1 of 1 positions shown · non-contrast
Comparison: 03/03/2014; 02/08/2014; 01/25/2014

CLINICAL DATA: Headache, end-stage renal disease, shortness of
breath

EXAM:
PORTABLE CHEST - 1 VIEW

[AP]
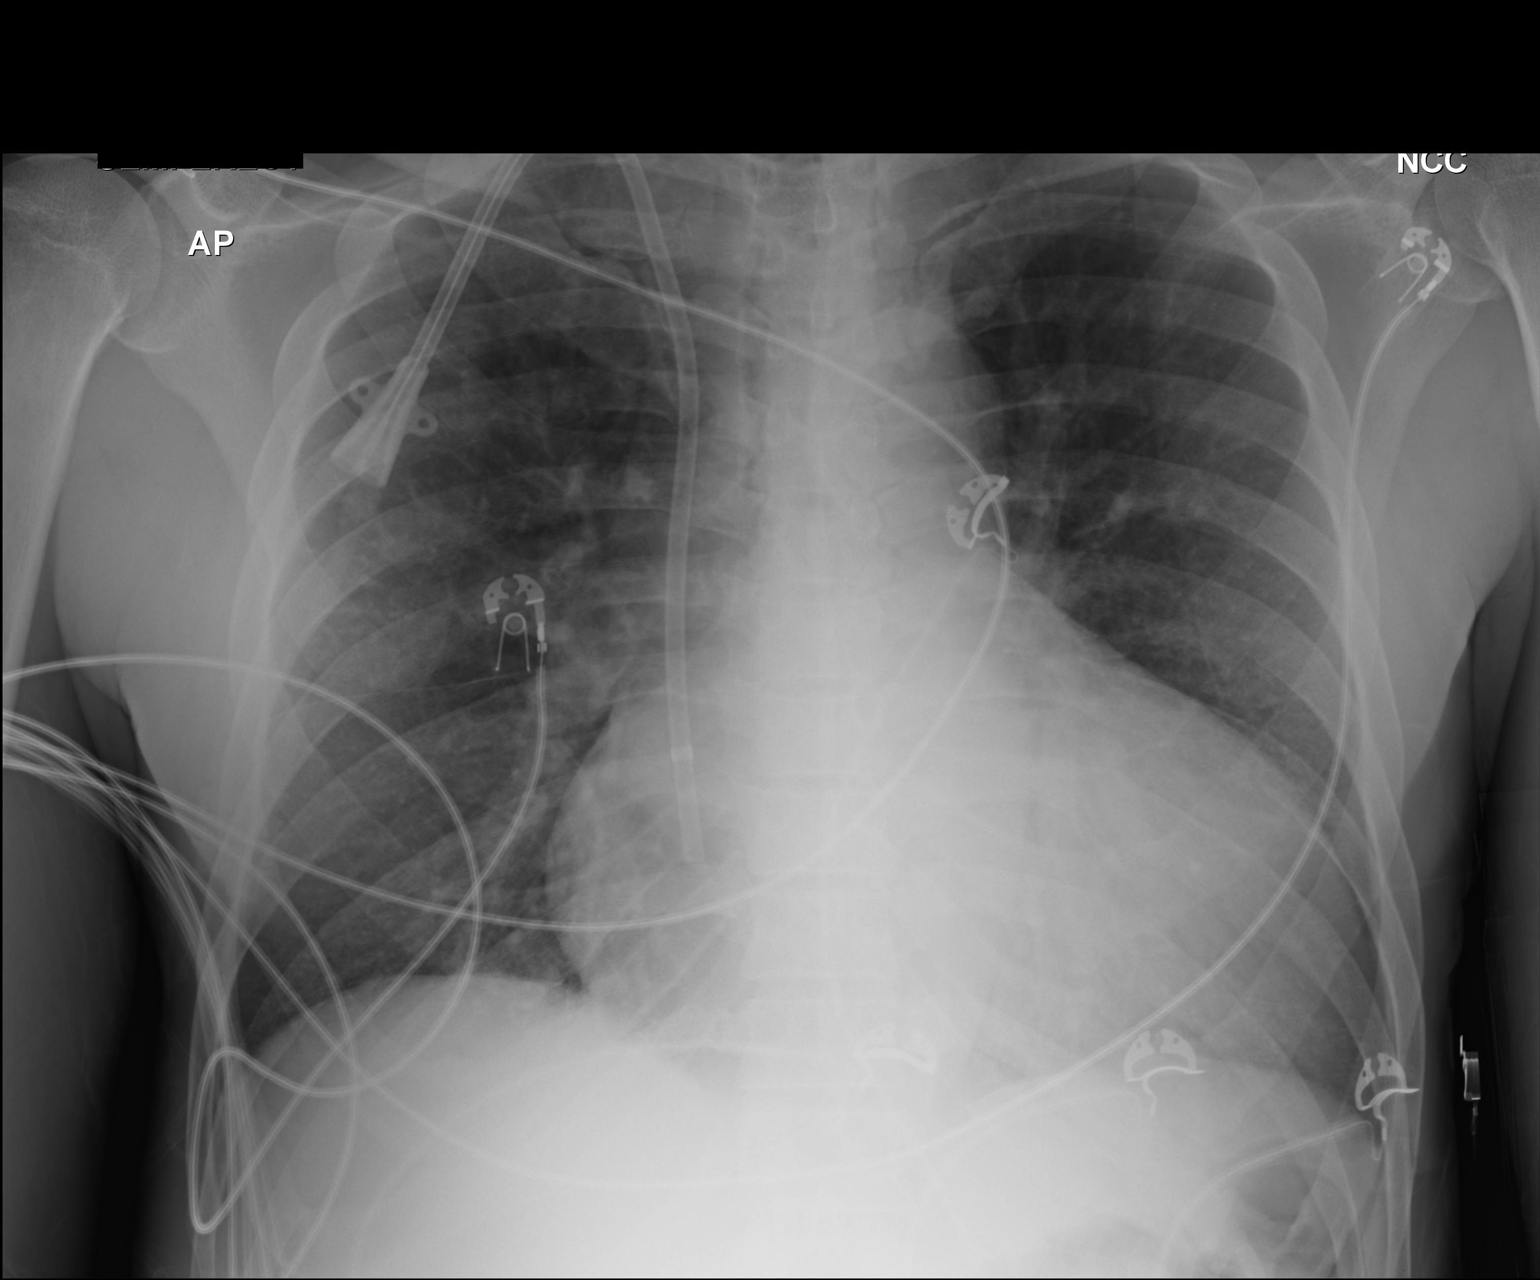

[1 of 1 positions shown; findings below may reference images not displayed]

FINDINGS: Grossly unchanged enlarged cardiac silhouette and mediastinal
contours. Stable position of support apparatus. Overall improved
aeration of the lungs. No new focal airspace opacities. No pleural
effusion or pneumothorax. No evidence of edema.
IMPRESSION: 1.  No acute cardiopulmonary disease.
2. Overall improved aeration along suggests resolving edema and
atelectasis.

## 2016-08-19 ENCOUNTER — Ambulatory Visit (INDEPENDENT_AMBULATORY_CARE_PROVIDER_SITE_OTHER): Payer: Medicare Other | Admitting: Family Medicine

## 2016-08-19 ENCOUNTER — Encounter: Payer: Self-pay | Admitting: Family Medicine

## 2016-08-19 VITALS — BP 202/100 | HR 88 | Temp 98.5°F | Resp 16 | Ht 73.0 in | Wt 164.0 lb

## 2016-08-19 DIAGNOSIS — Z79899 Other long term (current) drug therapy: Secondary | ICD-10-CM

## 2016-08-19 DIAGNOSIS — I10 Essential (primary) hypertension: Secondary | ICD-10-CM | POA: Diagnosis not present

## 2016-08-19 DIAGNOSIS — F411 Generalized anxiety disorder: Secondary | ICD-10-CM | POA: Diagnosis not present

## 2016-08-19 DIAGNOSIS — G4700 Insomnia, unspecified: Secondary | ICD-10-CM

## 2016-08-19 DIAGNOSIS — Z992 Dependence on renal dialysis: Secondary | ICD-10-CM

## 2016-08-19 DIAGNOSIS — N186 End stage renal disease: Secondary | ICD-10-CM | POA: Diagnosis not present

## 2016-08-19 DIAGNOSIS — M545 Low back pain, unspecified: Secondary | ICD-10-CM

## 2016-08-19 DIAGNOSIS — N2581 Secondary hyperparathyroidism of renal origin: Secondary | ICD-10-CM | POA: Diagnosis not present

## 2016-08-19 DIAGNOSIS — D631 Anemia in chronic kidney disease: Secondary | ICD-10-CM | POA: Diagnosis not present

## 2016-08-19 DIAGNOSIS — G8929 Other chronic pain: Secondary | ICD-10-CM | POA: Diagnosis not present

## 2016-08-19 MED ORDER — CLONIDINE HCL 0.3 MG/24HR TD PTWK: 0.3000 mg | MEDICATED_PATCH | TRANSDERMAL | 1 refills | Status: DC

## 2016-08-19 NOTE — Patient Instructions (Addendum)
  Call the [facility] to schedule an appointment.  If they are unable to help you - try the [facility] and get scheduled with the [facility] Psychiatrist. [address], [place], [place] [address]  Phone: [phone]  I put in a referral to [facility]. YOu can call them this week to get scheduled.  Please sign a release so we can get your urologist, nephrologist, and all other specialist records not in the system sent here.  Restart your clonidine patch 0.62m qwk - I sent this to your pharmacy.  IF you received an x-ray today, you will receive an invoice from [facility]. Please contact [facility] at [phone] questions or concerns regarding your invoice.   IF you received labwork today, you will receive an invoice from [facility] Please contact [facility] at [phone] questions or concerns regarding your invoice.   Our billing staff will not be able to assist you with questions regarding bills from these companies.  You will be contacted with the lab results as soon as they are available. The fastest way to get your results is to activate your My Chart account. Instructions are located on the last page of this paperwork. If you have not heard from [facility]arding the results in 2 weeks, please contact this office.     You can call the 24-hour CRisonat [phone] [phone] immediate assistance. Among several different types of services, they offer an Intensive oupatient program for mood disorders - which is a group type setting Monday-Friday 9-noon.  You can schedule an assessment by calling the above numbers during which the costs for the program and insurance benefits will be reviewed.    No psychological or psychiatric services take physician referrals - they always want the patient to call. Some excellent private psychiatrists for individual  counseling are:  [place] [address], [place] [address] Phone:([phone]) [address] [place] [address] Phone:([phone]) 5[place]: [phone]  You can always go to [facility] if suicidal or there are walk-in crisis services available at: [facility] [id number]([phone] Hour of operations: Monday-Friday, 8:30-5 p.m.  They take medicare/medicaid and the patient can contact [facility] at [phone] [email or web address] [email or web address] for more information.

## 2016-08-19 NOTE — Progress Notes (Signed)
Subjective:    Patient ID: Frank [name], male    DOB: [birth year], 51 y.o.   MRN: [id number] Chief Complaint  Patient presents with  . Follow-up    back pain and anxiety  . other    pt would like a referal to pain maangement    HPI  He is on dialysis and MWF. No sleep, heavy anxiety, back pain and he couldn't stay calm.  His BP has not been good but not usually as high as today.  His BP at home to often and he gets caught up and worried about it.  Normally BP is 135/90 - yesterday it was 190/120 and has been that high for the past week. Nothing has changed in his life that would explain the increase in BP.   He has had sxs for 19 yrs but just started on treatment for anxiety 6-8 mos prior and for back pain the past 2 years.  He has been seen at [facility]  Dr. Florene Glen at [facility] is his nephrologist. Dr. Florene Glen took him off the clonidine patch 3 wks ago because BP was ok while he was in the hosp. He reports no side effects to the clonidine patch.    He has not heard about any of the psychiatric referrals yet because he lost his phone and so will not have known about any calls for the past week.  C/o chronic back pain.  He has seen a urologist last month.  He was told he had a cyst on his kidney which is considered cancerous and they are talking about removing his kidney.  He has never had any imaging of his back that I can see.  He has been seen at [facility] for work-up of this - he reports a lot of trouble traveling between the 2 systems. In June xrays of thoracic spine were normal, cspine showed mild multilevel cervical spondylosis C4-5, C5-6 Mild multilevel cervical spondylosis most notably involving C4-5 and C5-6 with mild disc space narrowing, and endplate osteophytosis.  Thinks he can go back to normal. Lumbar spine films are normal January 07, 2016.  He was in pain management 2 years prior at [facility].  The specialists at [facility] and urologist told him his back pain is due to the kidney  cyst. He has an appt with the urology on 2/24 for work-up and removing the kidney.  Past Medical History:  Diagnosis Date  . Anemia   . Anxiety   . Chronic combined systolic and diastolic CHF (congestive heart failure) (HCC)    a. EF 20-25% by echo in 08/2015 b. echo 10/2015: EF 35-40%, diffuse HK, severe LAE, moderate RAE, small pericardial effusion  . Complication of anesthesia    itching, sore throat  . Depression   . Dialysis patient ([place])   . ESRD (end stage renal disease) ([place])    due to HTN per patient, followed at [facility], s/p failed kidney transplant - dialysis Tue, Th, Sat  . Hyperkalemia 12/2015  . Hypertension   . Junctional rhythm    a. noted in 08/2015: hyperkalemic at that time  b. 12/2015: presented in junctional rhythm w/ K+ of 6.6. Resolved with improvement of K+ levels.  . Nonischemic cardiomyopathy ([place])    a. 08/2014: cath showing minimal CAD, but tortuous arteries noted.   . Personal history of DVT (deep vein thrombosis)/ PE 05/26/2016   In Oct 2015 had small subsemental LUL PE w/o DVT (LE dopplers neg) and was felt to  be HD cath related, treated w coumadin.  IN May 2016 had small vein DVT (acute/subacute) in the R basilic/ brachial veins of the RUE, resumed on coumadin.  Had R sided HD cath at that time.    . Renal insufficiency   . Shortness of breath   . Type II diabetes mellitus (HCC)    No history per patient, but remains under history as A1c would not be accurate given on dialysis   Past Surgical History:  Procedure Laterality Date  . CAPD INSERTION    . CAPD REMOVAL    . INGUINAL HERNIA REPAIR Right 02/14/2015   Procedure: REPAIR INCARCERATED RIGHT INGUINAL HERNIA;  Surgeon: [name], MD;  Location: [place];  Service: General;  Laterality: Right;  . INSERTION OF DIALYSIS CATHETER Right 09/23/2015   Procedure: exchange of Right internal Dialysis Catheter.;  Surgeon: [name], MD;  Location: [place];  Service: Vascular;  Laterality: Right;  . IR GENERIC  HISTORICAL  07/16/2016   IR US GUIDE VASC ACCESS LEFT 07/16/2016 Corrie Mckusick, DO [facility]-INTERV RAD  . IR GENERIC HISTORICAL Left 07/16/2016   IR THROMBECTOMY AV FISTULA W/THROMBOLYSIS/PTA INC/SHUNT/IMG LEFT 07/16/2016 Corrie Mckusick, DO [facility]INTER[facility]  . KIDNEY RECEIPIENT  2006   failed and started HD in March 2014  . LEFT HEART CATHETERIZATION WITH CORONARY ANGIOGRAM N/A 09/02/2014   Procedure: LEFT HEART CATHETERIZATION WITH CORONARY ANGIOGRAM;  Surgeon: [name], MD;  Location: [facility] CATH LAB;  Service: Cardiovascular;  Laterality: N/A;   Current Outpatient Prescriptions on File Prior to Visit  Medication Sig Dispense Refill  . acetaminophen-codeine (TYLENOL #3) 300-30 MG tablet Take 1 tablet by mouth every 6 (six) hours as needed. 60 tablet 0  . allopurinol (ZYLOPRIM) 100 MG tablet Take 100 mg by mouth daily.    [name] ALPRAZolam (XANAX) 1 MG tablet Take 1 tablet (1 mg total) by mouth at bedtime as needed for sleep. 6 tablet 0  . amLODipine (NORVASC) 5 MG tablet Take 1 tablet (5 mg total) by mouth at bedtime. 30 tablet 1  . atorvastatin (LIPITOR) 40 MG tablet Take 1 tablet (40 mg total) by mouth daily at 6 PM. 30 tablet 1  . carvedilol (COREG) 25 MG tablet Take 1 tablet (25 mg total) by mouth 2 (two) times daily with a meal. 30 tablet 1  . cinacalcet (SENSIPAR) 30 MG tablet Take 30 mg by mouth every evening.     . clonazePAM (KLONOPIN) 1 MG tablet Take 1 mg by mouth 2 (two) times daily.    [name] doxazosin (CARDURA) 2 MG tablet Take 1 tablet (2 mg total) by mouth daily. 30 tablet 1  . famotidine (PEPCID) 20 MG tablet Take 1 tablet (20 mg total) by mouth 2 (two) times daily. 30 tablet 0  . gabapentin (NEURONTIN) 100 MG capsule Take 1 capsule (100 mg total) by mouth at bedtime. (Patient taking differently: Take 100 mg by mouth 3 (three) times daily. )    . HYDROcodone-acetaminophen (NORCO/VICODIN) 5-325 MG tablet Take 1 tablet by mouth every 6 (six) hours as needed for moderate pain.    . isosorbide  mononitrate (IMDUR) 60 MG 24 hr tablet Take 1 tablet (60 mg total) by mouth daily. 30 tablet 1  . methocarbamol (ROBAXIN) 500 MG tablet Take 1 tablet (500 mg total) by mouth 2 (two) times daily as needed for muscle spasms. 10 tablet 0  . omeprazole (PRILOSEC) 20 MG capsule Take 1 capsule (20 mg total) by mouth daily. 30 capsule 1  .  ondansetron (ZOFRAN ODT) 4 MG disintegrating tablet Take 1 tablet (4 mg total) by mouth every 8 (eight) hours as needed for nausea or vomiting. 10 tablet 0  . oxycodone (OXY-IR) 5 MG capsule Take 1 capsule (5 mg total) by mouth every 4 (four) hours as needed for pain. 5 capsule 0  . oxyCODONE-acetaminophen (PERCOCET/ROXICET) 5-325 MG tablet Take 1 tablet by mouth every 6 (six) hours as needed for severe pain.    [name] senna-docusate (SENOKOT-S) 8.6-50 MG tablet Take 1 tablet by mouth at bedtime as needed for mild constipation. 30 tablet 0  . sevelamer carbonate (RENVELA) 800 MG tablet Take 1,600 mg by mouth 3 (three) times daily with meals.      No current facility-administered medications on file prior to visit.    Allergies  Allergen Reactions  . Butalbital-Apap-Caffeine Shortness Of Breath, Swelling and Other (See Comments)    Swelling in throat  . Ferrlecit [Na Ferric Gluc Cplx In Sucrose] Shortness Of Breath, Swelling and Other (See Comments)    Swelling in throat  . Minoxidil Shortness Of Breath  . Tylenol [Acetaminophen] Anaphylaxis and Swelling  . Darvocet [Propoxyphene N-Acetaminophen] Hives   Family History  Problem Relation Age of Onset  . Hypertension Other    Social History   Social History  . Marital status: Married    Spouse name: N/A  . Number of children: 3  . Years of education: [employer]   Social History Main Topics  . Smoking status: Former Smoker    Packs/day: 0.00    Years: 1.00    Types: Cigarettes  . Smokeless tobacco: Never Used     Comment: quit Jan 2014  . Alcohol use No  . Drug use: No  . Sexual activity: Not Currently    Other Topics Concern  . None   Social History Narrative   Owns own plumbing company   Depression screen [facility] 2/9 08/19/2016 08/13/2016 05/02/2016  Decreased Interest 0 0 0  Down, Depressed, Hopeless 0 0 0  PHQ - 2 Score 0 0 0  Some recent data might be hidden    Review of Systems See hpi    Objective:   Physical Exam  Constitutional: He is oriented to person, place, and time. He appears well-developed and well-nourished. No distress.  HENT:  Head: Normocephalic and atraumatic.  Eyes: Conjunctivae are normal. Pupils are equal, round, and reactive to light. No scleral icterus.  Neck: Normal range of motion. Neck supple. No thyromegaly present.  Cardiovascular: Normal rate, regular rhythm, normal heart sounds and intact distal pulses.   Pulmonary/Chest: Effort normal and breath sounds normal. No respiratory distress.  Musculoskeletal: He exhibits no edema.  Lymphadenopathy:    He has no cervical adenopathy.  Neurological: He is alert and oriented to person, place, and time.  Skin: Skin is warm and dry. He is not diaphoretic.  Psychiatric: He has a normal mood and affect. His behavior is normal.   BP (!) 202/100   Pulse 88   Temp 98.5 F (36.9 C) (Oral)   Resp 16   Ht _0  (1.854 m)   Wt 164 lb (74.4 kg)   SpO2 98%   BMI 21.64 kg/m      Assessment & Plan:   1. Chronic midline low back pain without sciatica - refer to [facility] for mngmnt - needs specialist since on dialysis and narcotic dependent. Using gabapentin prn.  2. ESRD on dialysis [facility]) - goes MWF followed by Dr. Florene Glen at [facility] after  failed renal transplant (so has 3 non-functioning kidneys in him)  3. Insomnia, unspecified type   4. Anxiety state   5. High risk medication use   6. Malignant hypertension - pt reports his clonidine patch was stopped but not sure why - denies side effects and reports good compliance. Restart.  Reports he had to stop hydralazine due to sweats.   Advised pt that  he needs to RTC for repeat visit to cont to establish and discuss additional hx and meds - he has seen several primary physicians just once or twice and has been receiving VERY frequent care in the ER - in the ER well > 50x this past yr - likely almost 100 visits - and on a suprising number of occasions he has been seen in both the [place] ER so will take an additional visit or two for me to catch up with his extensive hx.  Orders Placed This Encounter  Procedures  . Ambulatory referral to Pain Clinic    Referral Priority:   Routine    Referral Type:   Consultation    Referral Reason:   Specialty Services Required    Requested Specialty:   Pain Medicine    Number of Visits Requested:   1    Meds ordered this encounter  Medications  . cloNIDine (CATAPRES - DOSED IN MG/24 HR) 0.3 mg/24hr patch    Sig: Place 1 patch (0.3 mg total) onto the skin every Monday.    Dispense:  4 patch    Refill:  1   Over 40 min spent in face-to-face evaluation of and consultation with patient and coordination of care.  Over 50% of this time was spent counseling this patient.  Delman Cheadle, M.D.  Primary Care at [facility] [address] [place], [place] [address] [phone] phone [phone] fax  09/03/16 10:42 PM

## 2016-08-21 DIAGNOSIS — Z992 Dependence on renal dialysis: Secondary | ICD-10-CM | POA: Diagnosis not present

## 2016-08-21 DIAGNOSIS — D631 Anemia in chronic kidney disease: Secondary | ICD-10-CM | POA: Diagnosis not present

## 2016-08-21 DIAGNOSIS — T8610 Unspecified complication of kidney transplant: Secondary | ICD-10-CM | POA: Diagnosis not present

## 2016-08-21 DIAGNOSIS — N2581 Secondary hyperparathyroidism of renal origin: Secondary | ICD-10-CM | POA: Diagnosis not present

## 2016-08-21 DIAGNOSIS — N186 End stage renal disease: Secondary | ICD-10-CM | POA: Diagnosis not present

## 2016-08-23 DIAGNOSIS — D509 Iron deficiency anemia, unspecified: Secondary | ICD-10-CM | POA: Diagnosis not present

## 2016-08-23 DIAGNOSIS — D631 Anemia in chronic kidney disease: Secondary | ICD-10-CM | POA: Diagnosis not present

## 2016-08-23 DIAGNOSIS — N186 End stage renal disease: Secondary | ICD-10-CM | POA: Diagnosis not present

## 2016-08-23 DIAGNOSIS — N2581 Secondary hyperparathyroidism of renal origin: Secondary | ICD-10-CM | POA: Diagnosis not present

## 2016-08-24 IMAGING — CR DG CHEST 2V
2 series · 2 of 2 positions shown · non-contrast
Comparison: January 24, 2014

CLINICAL DATA: Hypertension with headache

EXAM:
CHEST  2 VIEW

[w chest lat]
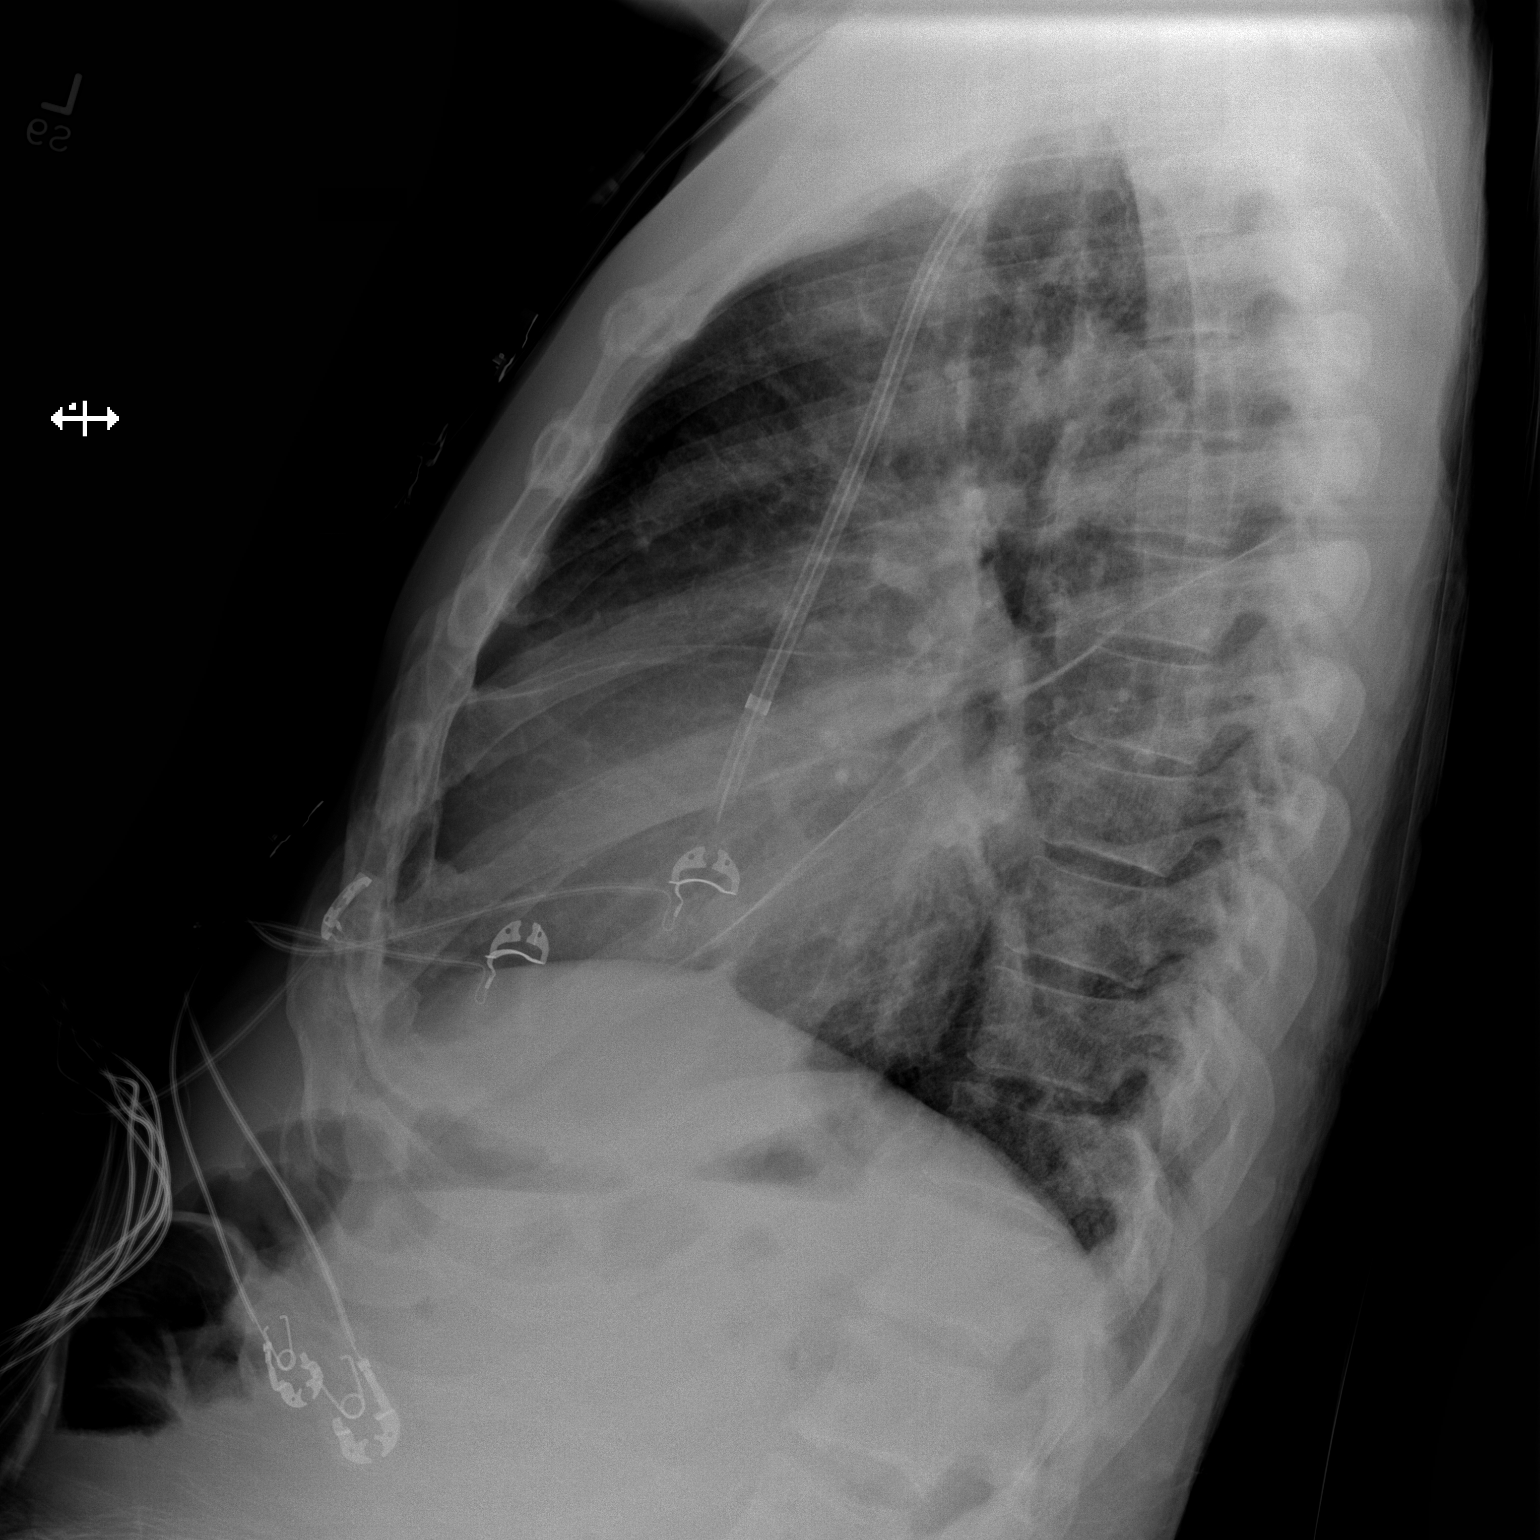

[x chest ap]
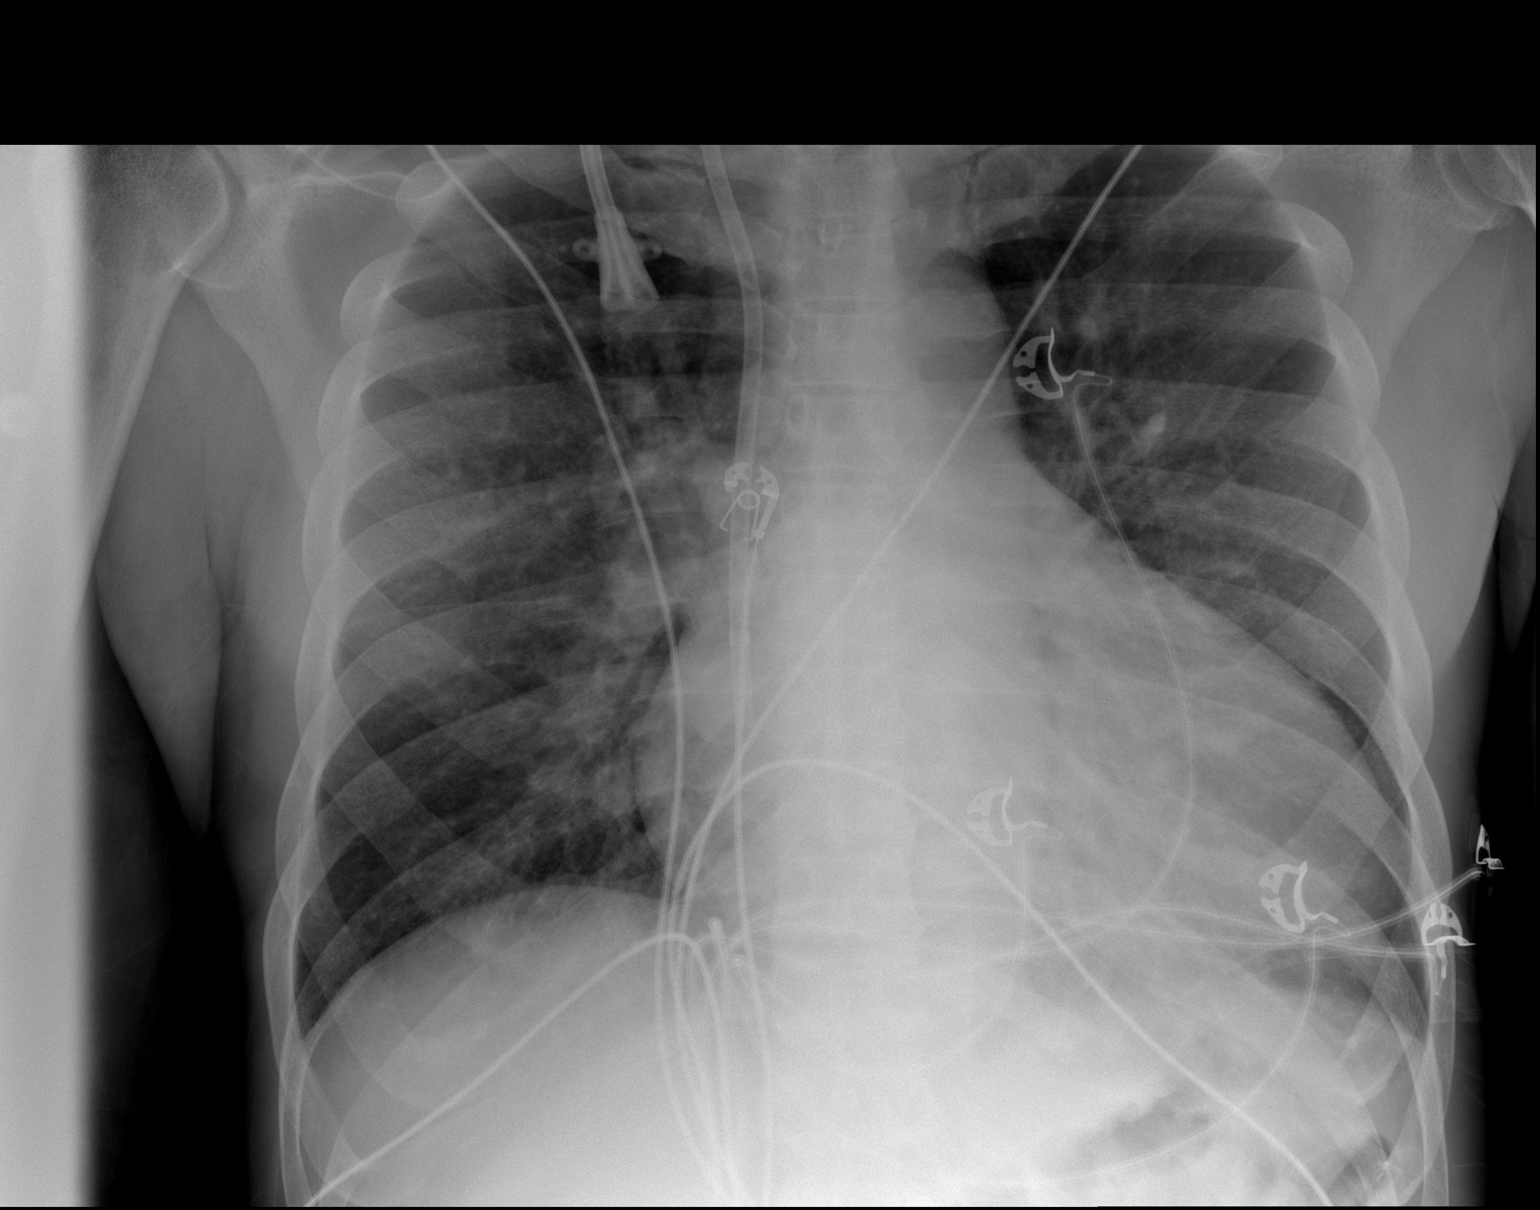

[2 of 2 positions shown; findings below may reference images not displayed]

FINDINGS: There is no edema or consolidation. Heart is enlarged but stable.
Pulmonary vascularity is normal. Central catheter tip is in the
right atrium. No appreciable adenopathy. No pneumothorax. No bone
lesions.
IMPRESSION: Cardiomegaly.  No edema or consolidation.

## 2016-08-24 IMAGING — CT CT HEAD W/O CM
2 series · 15 of 30 positions shown, 19 images · non-contrast
Comparison: Noncontrast CT scan of brain March 27, 2014

CLINICAL DATA: Intermittent headache for 2 days; history of chronic
dialysis dependent renal failure ; history of hypertension and
vomiting

EXAM:
CT HEAD WITHOUT CONTRAST
TECHNIQUE: Contiguous axial images were obtained from the base of the skull
through the vertex without intravenous contrast.

[Series 2: head w/o · axial · non-contrast · 0.45mm/px · z∈[-118,+12]mm · 13 of 32 slices shown, 17 images]
[im 3/32  brain]
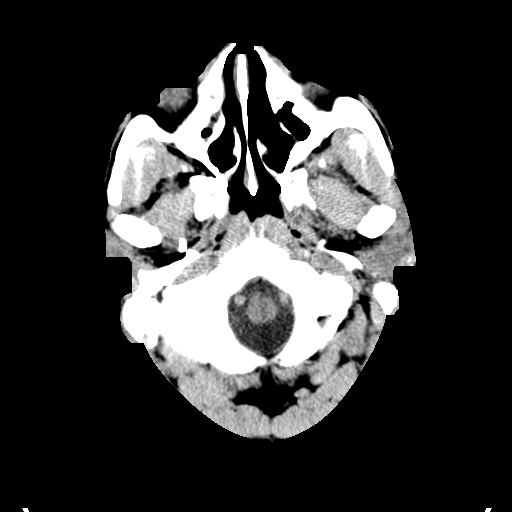
[im 3/32  bone]
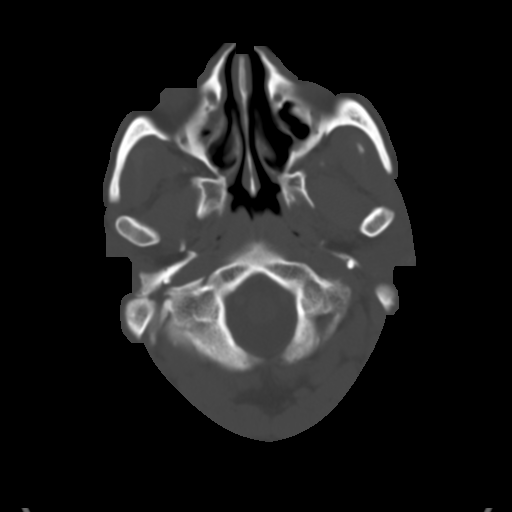
[im 5/32  brain]
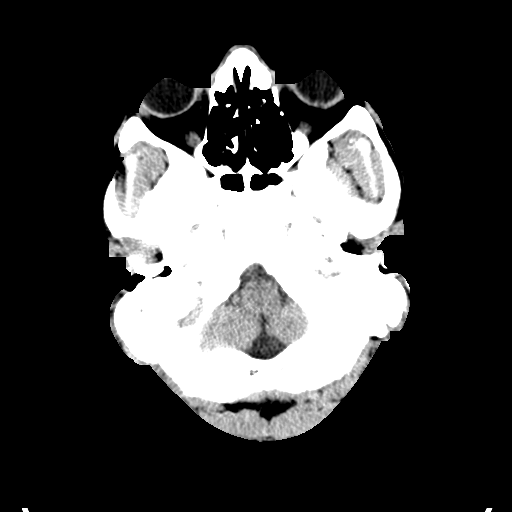
[im 7/32  brain]
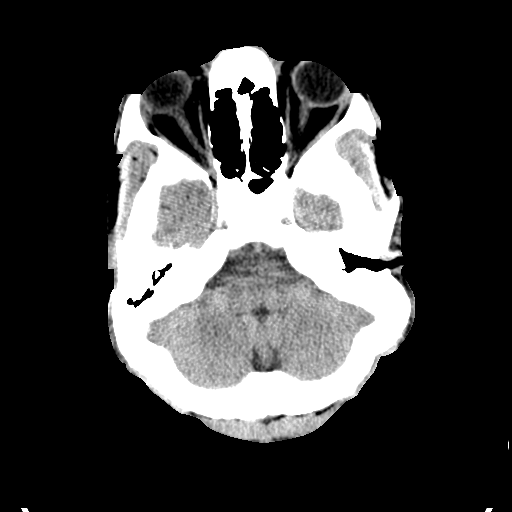
[im 9/32  brain]
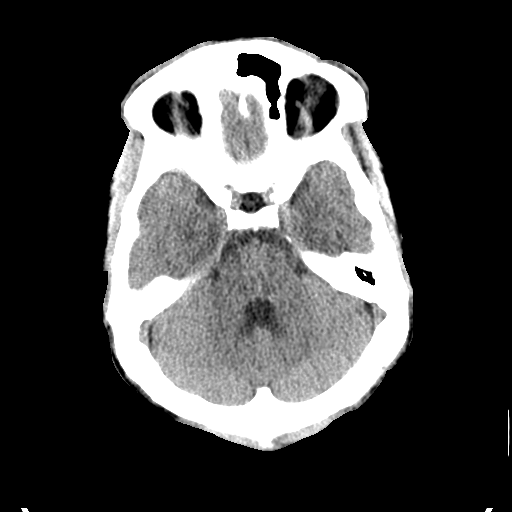
[im 12/32  brain]
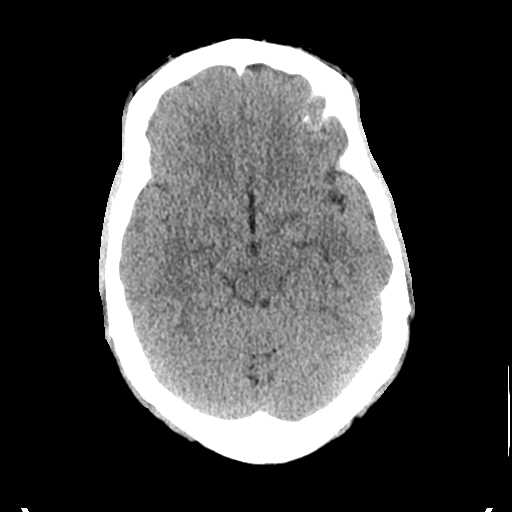
[im 12/32  bone]
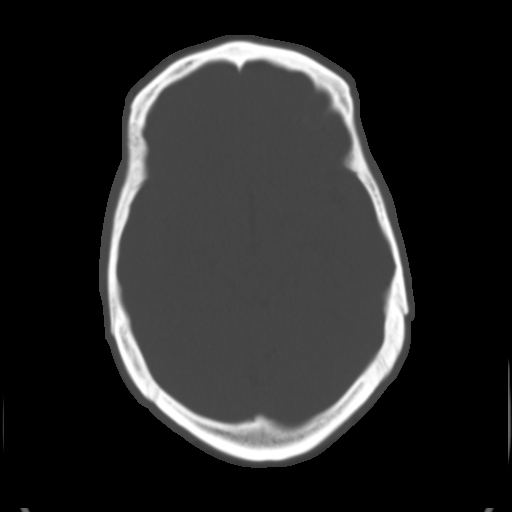
[im 14/32  brain]
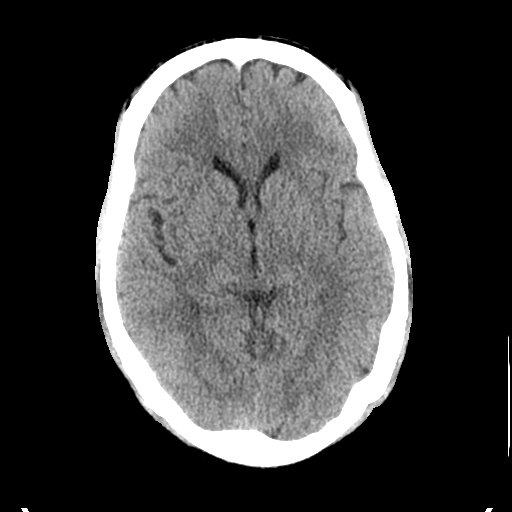
[im 16/32  brain]
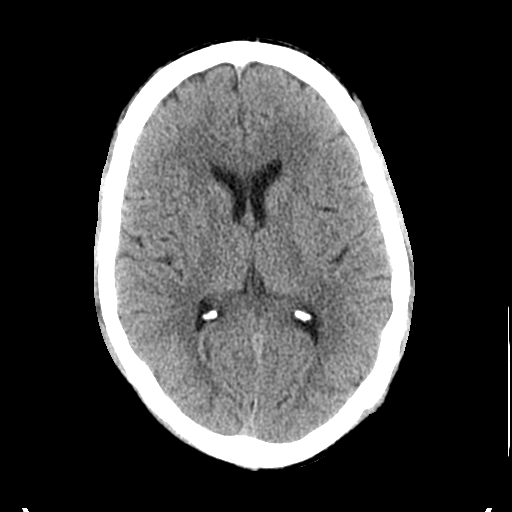
[im 18/32  brain]
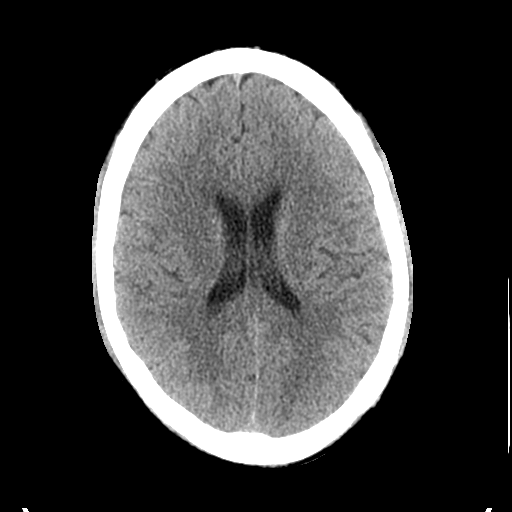
[im 20/32  brain]
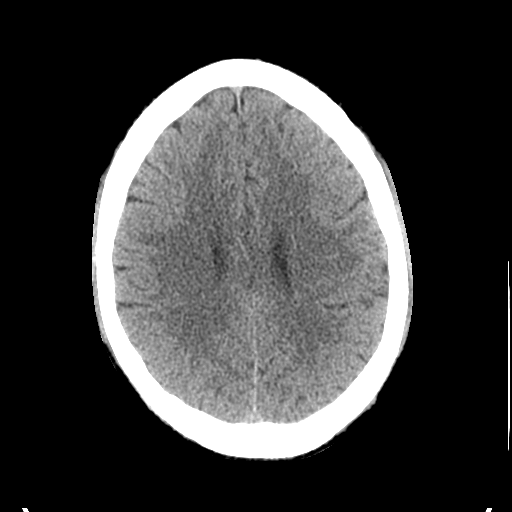
[im 20/32  bone]
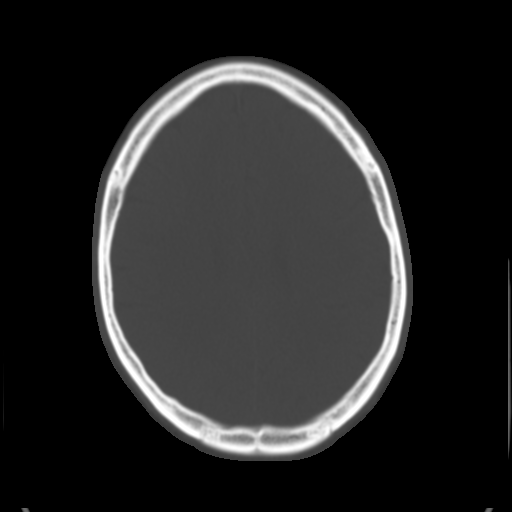
[im 23/32  brain]
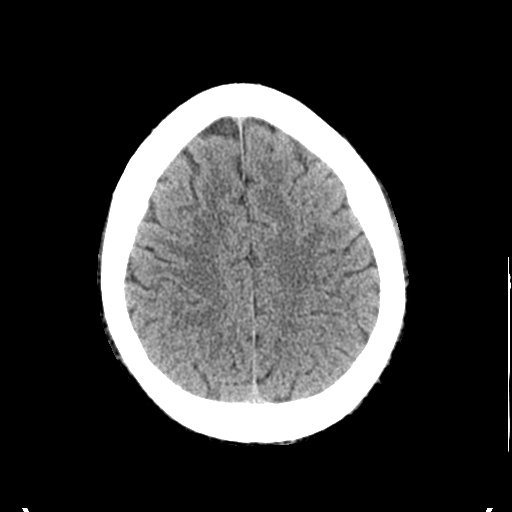
[im 25/32  brain]
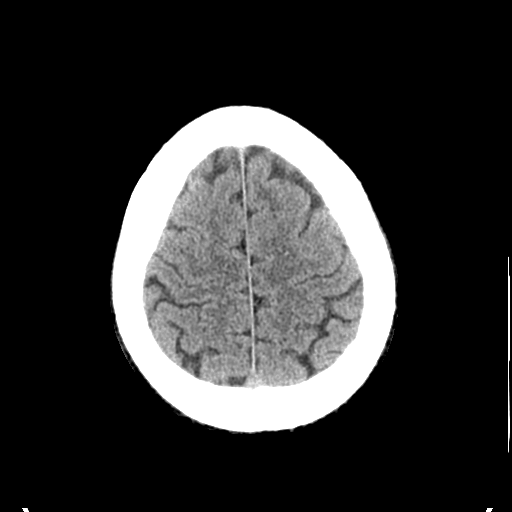
[im 27/32  brain]
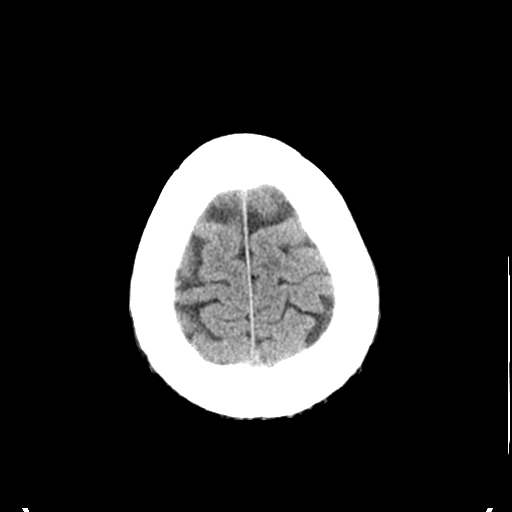
[im 29/32  brain]
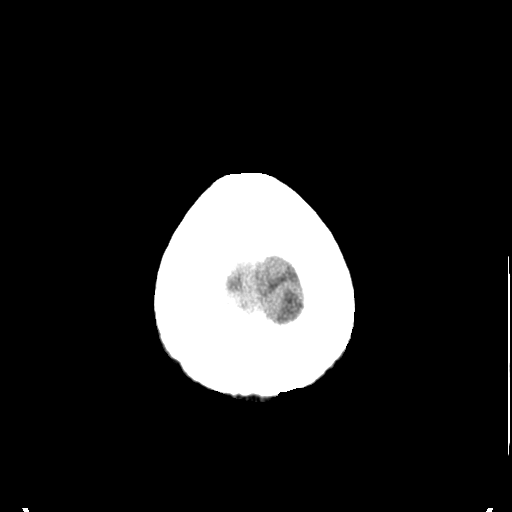
[im 29/32  bone]
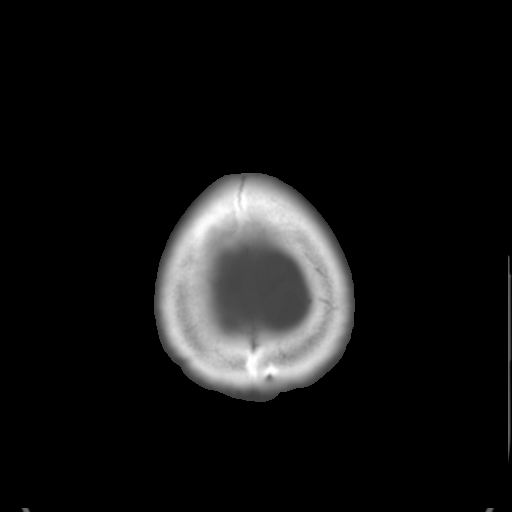

[Series 3: bone windows · axial · 0.45mm/px · z∈[-118,-98]mm · 2 of 32 slices shown]
[im 3/32  bone]
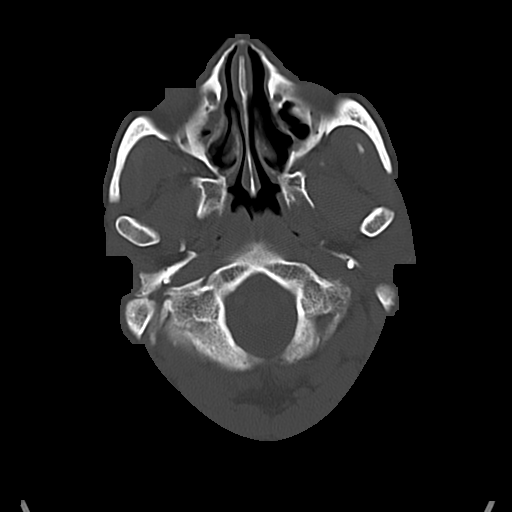
[im 7/32  bone]
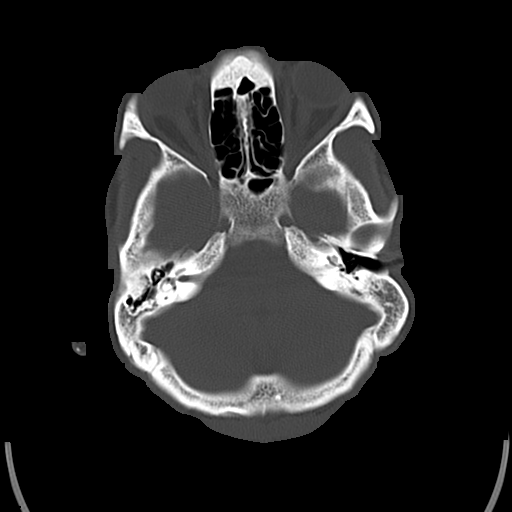

[15 of 30 positions shown; findings below may reference images not displayed]

FINDINGS: The ventricles are normal in size and position. There is no
intracranial hemorrhage nor intracranial mass effect. No acute
ischemic changes are demonstrated. The cerebellum and brainstem are
normal.

There is mucoperiosteal thickening within both maxillary sinuses.
Minimal involvement of the ethmoid and right sphenoid sinus cells is
demonstrated. There is no skull fracture nor lytic or blastic
calvarial lesion.
IMPRESSION: 1. There is no acute intracranial hemorrhage nor other acute
abnormality of the brain.
2. Inflammatory changes of the paranasal sinuses are demonstrated
and are little changed since the previous study. These findings
could be related to the patient's symptoms.

## 2016-08-26 DIAGNOSIS — N186 End stage renal disease: Secondary | ICD-10-CM | POA: Diagnosis not present

## 2016-08-26 DIAGNOSIS — N2581 Secondary hyperparathyroidism of renal origin: Secondary | ICD-10-CM | POA: Diagnosis not present

## 2016-08-26 DIAGNOSIS — D631 Anemia in chronic kidney disease: Secondary | ICD-10-CM | POA: Diagnosis not present

## 2016-08-26 DIAGNOSIS — D509 Iron deficiency anemia, unspecified: Secondary | ICD-10-CM | POA: Diagnosis not present

## 2016-08-28 DIAGNOSIS — D509 Iron deficiency anemia, unspecified: Secondary | ICD-10-CM | POA: Diagnosis not present

## 2016-08-28 DIAGNOSIS — N186 End stage renal disease: Secondary | ICD-10-CM | POA: Diagnosis not present

## 2016-08-28 DIAGNOSIS — N2581 Secondary hyperparathyroidism of renal origin: Secondary | ICD-10-CM | POA: Diagnosis not present

## 2016-08-28 DIAGNOSIS — D631 Anemia in chronic kidney disease: Secondary | ICD-10-CM | POA: Diagnosis not present

## 2016-08-30 DIAGNOSIS — D631 Anemia in chronic kidney disease: Secondary | ICD-10-CM | POA: Diagnosis not present

## 2016-08-30 DIAGNOSIS — N186 End stage renal disease: Secondary | ICD-10-CM | POA: Diagnosis not present

## 2016-08-30 DIAGNOSIS — N2581 Secondary hyperparathyroidism of renal origin: Secondary | ICD-10-CM | POA: Diagnosis not present

## 2016-08-30 DIAGNOSIS — D509 Iron deficiency anemia, unspecified: Secondary | ICD-10-CM | POA: Diagnosis not present

## 2016-09-02 ENCOUNTER — Other Ambulatory Visit: Payer: Self-pay

## 2016-09-02 DIAGNOSIS — D509 Iron deficiency anemia, unspecified: Secondary | ICD-10-CM | POA: Diagnosis not present

## 2016-09-02 DIAGNOSIS — N186 End stage renal disease: Secondary | ICD-10-CM | POA: Diagnosis not present

## 2016-09-02 DIAGNOSIS — D631 Anemia in chronic kidney disease: Secondary | ICD-10-CM | POA: Diagnosis not present

## 2016-09-02 DIAGNOSIS — N2581 Secondary hyperparathyroidism of renal origin: Secondary | ICD-10-CM | POA: Diagnosis not present

## 2016-09-03 DIAGNOSIS — E1122 Type 2 diabetes mellitus with diabetic chronic kidney disease: Secondary | ICD-10-CM | POA: Diagnosis not present

## 2016-09-03 DIAGNOSIS — Z992 Dependence on renal dialysis: Secondary | ICD-10-CM | POA: Diagnosis not present

## 2016-09-03 DIAGNOSIS — Z79899 Other long term (current) drug therapy: Secondary | ICD-10-CM | POA: Diagnosis not present

## 2016-09-03 DIAGNOSIS — G4739 Other sleep apnea: Secondary | ICD-10-CM | POA: Diagnosis not present

## 2016-09-03 DIAGNOSIS — K56609 Unspecified intestinal obstruction, unspecified as to partial versus complete obstruction: Secondary | ICD-10-CM | POA: Diagnosis not present

## 2016-09-03 DIAGNOSIS — Z7901 Long term (current) use of anticoagulants: Secondary | ICD-10-CM | POA: Diagnosis not present

## 2016-09-03 DIAGNOSIS — I132 Hypertensive heart and chronic kidney disease with heart failure and with stage 5 chronic kidney disease, or end stage renal disease: Secondary | ICD-10-CM | POA: Diagnosis not present

## 2016-09-03 DIAGNOSIS — Z888 Allergy status to other drugs, medicaments and biological substances status: Secondary | ICD-10-CM | POA: Diagnosis not present

## 2016-09-03 DIAGNOSIS — Z8249 Family history of ischemic heart disease and other diseases of the circulatory system: Secondary | ICD-10-CM | POA: Diagnosis not present

## 2016-09-03 DIAGNOSIS — G8929 Other chronic pain: Secondary | ICD-10-CM | POA: Diagnosis not present

## 2016-09-03 DIAGNOSIS — K56699 Other intestinal obstruction unspecified as to partial versus complete obstruction: Secondary | ICD-10-CM | POA: Diagnosis not present

## 2016-09-03 DIAGNOSIS — R109 Unspecified abdominal pain: Secondary | ICD-10-CM | POA: Diagnosis not present

## 2016-09-03 DIAGNOSIS — I5043 Acute on chronic combined systolic (congestive) and diastolic (congestive) heart failure: Secondary | ICD-10-CM | POA: Diagnosis not present

## 2016-09-03 DIAGNOSIS — Z8042 Family history of malignant neoplasm of prostate: Secondary | ICD-10-CM | POA: Diagnosis not present

## 2016-09-03 DIAGNOSIS — M549 Dorsalgia, unspecified: Secondary | ICD-10-CM | POA: Diagnosis not present

## 2016-09-03 DIAGNOSIS — N2889 Other specified disorders of kidney and ureter: Secondary | ICD-10-CM | POA: Diagnosis not present

## 2016-09-03 DIAGNOSIS — R1084 Generalized abdominal pain: Secondary | ICD-10-CM | POA: Diagnosis not present

## 2016-09-03 DIAGNOSIS — D631 Anemia in chronic kidney disease: Secondary | ICD-10-CM | POA: Diagnosis not present

## 2016-09-03 DIAGNOSIS — N186 End stage renal disease: Secondary | ICD-10-CM | POA: Diagnosis not present

## 2016-09-03 DIAGNOSIS — N261 Atrophy of kidney (terminal): Secondary | ICD-10-CM | POA: Diagnosis not present

## 2016-09-03 DIAGNOSIS — R112 Nausea with vomiting, unspecified: Secondary | ICD-10-CM | POA: Diagnosis not present

## 2016-09-03 DIAGNOSIS — Z833 Family history of diabetes mellitus: Secondary | ICD-10-CM | POA: Diagnosis not present

## 2016-09-03 IMAGING — CR DG CHEST 2V
1 series · 1 of 1 positions shown · non-contrast
Comparison: PA and lateral chest 04/05/2014.

CLINICAL DATA: Shortness of breath for 1 day. The patient missed
his dialysis appointment.

EXAM:
CHEST  2 VIEW

[w chest lat]
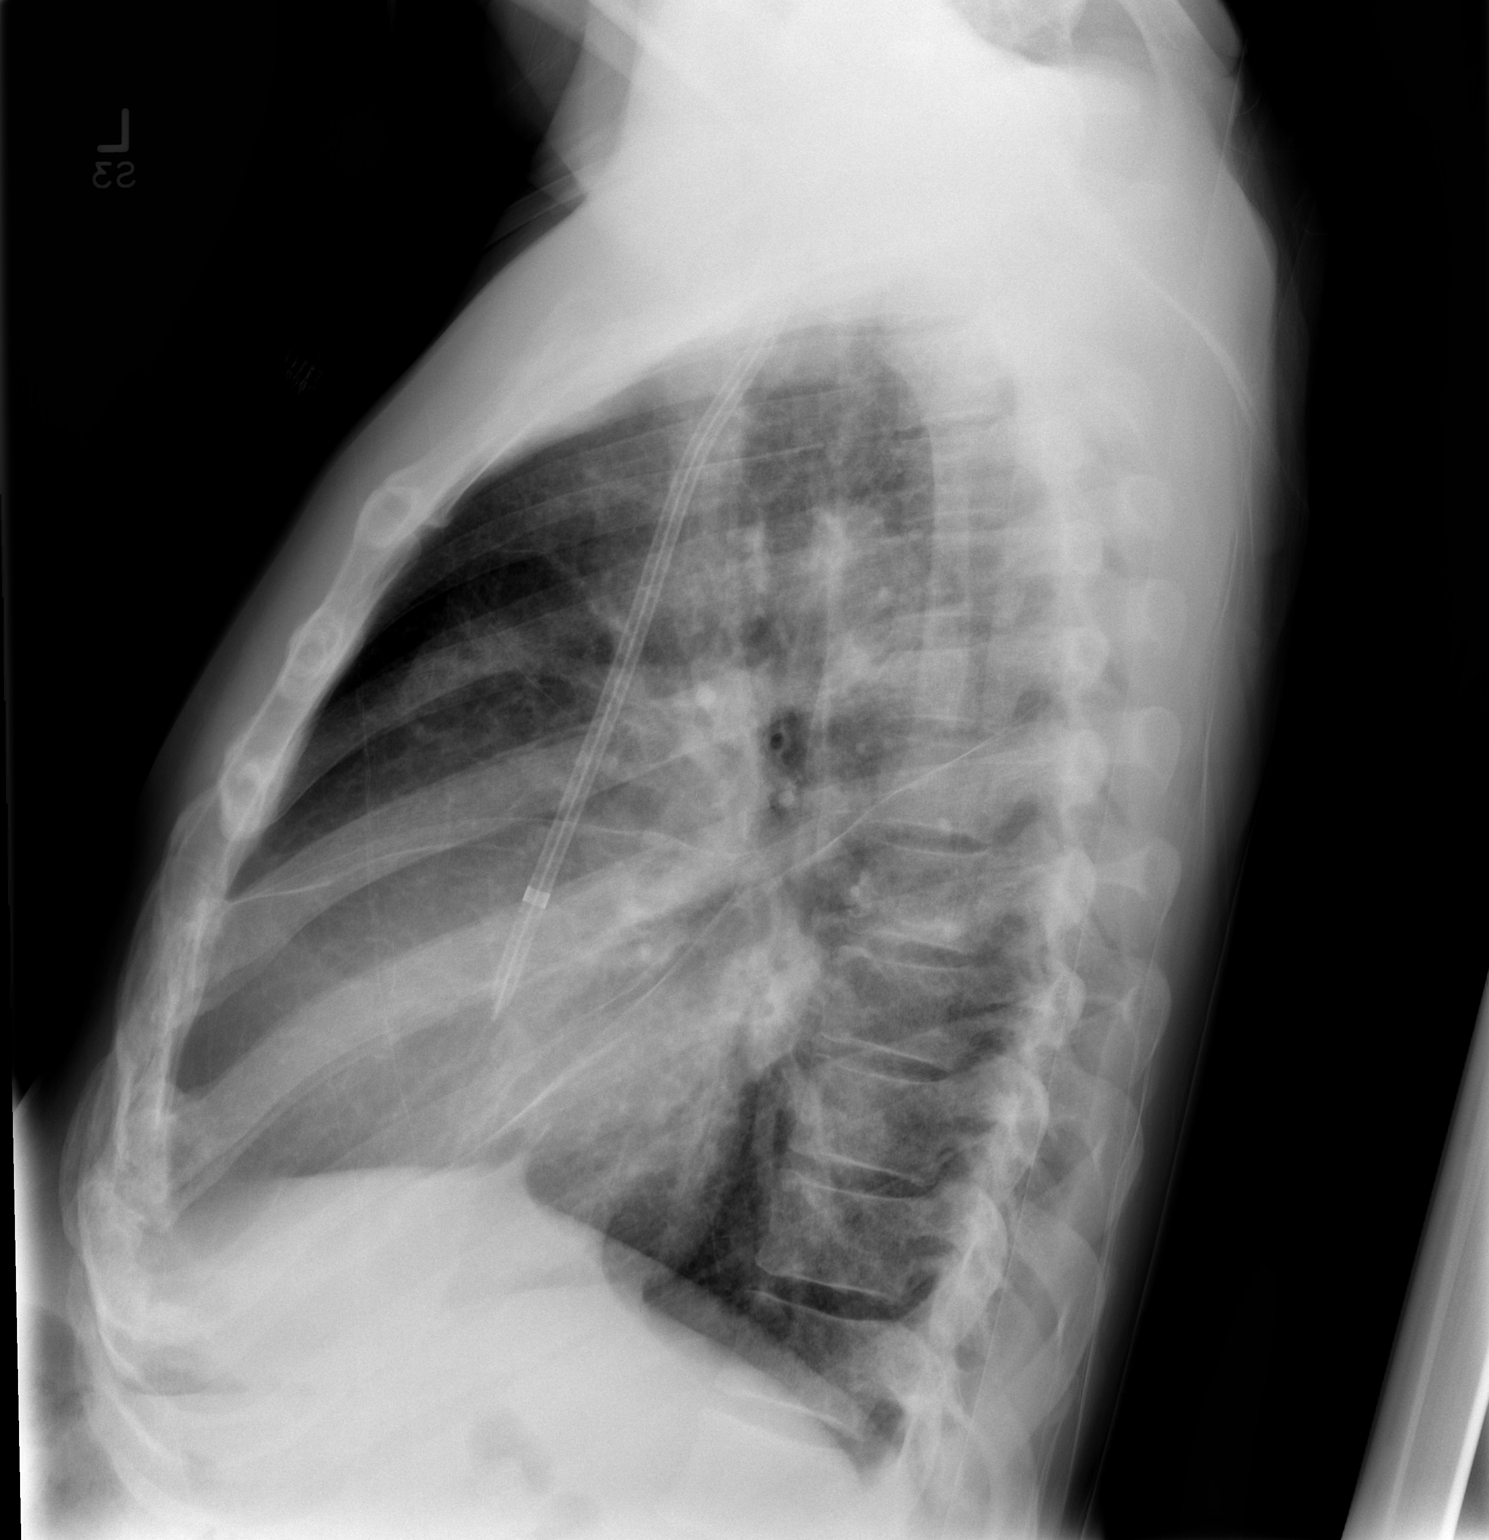

[1 of 1 positions shown; findings below may reference images not displayed]

FINDINGS: Right IJ approach dialysis catheter remains in place. There is
marked cardiomegaly but no edema. Lungs are clear. No pneumothorax
or pleural effusion.
IMPRESSION: Cardiomegaly without acute disease.

## 2016-09-04 DIAGNOSIS — Z992 Dependence on renal dialysis: Secondary | ICD-10-CM | POA: Diagnosis not present

## 2016-09-04 DIAGNOSIS — R112 Nausea with vomiting, unspecified: Secondary | ICD-10-CM | POA: Diagnosis not present

## 2016-09-04 DIAGNOSIS — G894 Chronic pain syndrome: Secondary | ICD-10-CM | POA: Diagnosis not present

## 2016-09-04 DIAGNOSIS — N186 End stage renal disease: Secondary | ICD-10-CM | POA: Diagnosis not present

## 2016-09-04 DIAGNOSIS — R1033 Periumbilical pain: Secondary | ICD-10-CM | POA: Diagnosis not present

## 2016-09-04 DIAGNOSIS — K56609 Unspecified intestinal obstruction, unspecified as to partial versus complete obstruction: Secondary | ICD-10-CM | POA: Diagnosis not present

## 2016-09-05 DIAGNOSIS — N186 End stage renal disease: Secondary | ICD-10-CM | POA: Diagnosis not present

## 2016-09-05 DIAGNOSIS — Z992 Dependence on renal dialysis: Secondary | ICD-10-CM | POA: Diagnosis not present

## 2016-09-05 DIAGNOSIS — D631 Anemia in chronic kidney disease: Secondary | ICD-10-CM | POA: Diagnosis not present

## 2016-09-05 DIAGNOSIS — R1033 Periumbilical pain: Secondary | ICD-10-CM | POA: Diagnosis not present

## 2016-09-05 DIAGNOSIS — N185 Chronic kidney disease, stage 5: Secondary | ICD-10-CM | POA: Diagnosis not present

## 2016-09-05 DIAGNOSIS — R112 Nausea with vomiting, unspecified: Secondary | ICD-10-CM | POA: Diagnosis not present

## 2016-09-05 DIAGNOSIS — K56609 Unspecified intestinal obstruction, unspecified as to partial versus complete obstruction: Secondary | ICD-10-CM | POA: Diagnosis not present

## 2016-09-06 DIAGNOSIS — K56609 Unspecified intestinal obstruction, unspecified as to partial versus complete obstruction: Secondary | ICD-10-CM | POA: Diagnosis not present

## 2016-09-06 DIAGNOSIS — N185 Chronic kidney disease, stage 5: Secondary | ICD-10-CM | POA: Diagnosis not present

## 2016-09-06 DIAGNOSIS — Z992 Dependence on renal dialysis: Secondary | ICD-10-CM | POA: Diagnosis not present

## 2016-09-06 DIAGNOSIS — R112 Nausea with vomiting, unspecified: Secondary | ICD-10-CM | POA: Diagnosis not present

## 2016-09-06 DIAGNOSIS — D631 Anemia in chronic kidney disease: Secondary | ICD-10-CM | POA: Diagnosis not present

## 2016-09-06 DIAGNOSIS — N186 End stage renal disease: Secondary | ICD-10-CM | POA: Diagnosis not present

## 2016-09-06 DIAGNOSIS — R1033 Periumbilical pain: Secondary | ICD-10-CM | POA: Diagnosis not present

## 2016-09-09 DIAGNOSIS — N186 End stage renal disease: Secondary | ICD-10-CM | POA: Diagnosis not present

## 2016-09-09 DIAGNOSIS — D631 Anemia in chronic kidney disease: Secondary | ICD-10-CM | POA: Diagnosis not present

## 2016-09-09 DIAGNOSIS — D509 Iron deficiency anemia, unspecified: Secondary | ICD-10-CM | POA: Diagnosis not present

## 2016-09-09 DIAGNOSIS — N2581 Secondary hyperparathyroidism of renal origin: Secondary | ICD-10-CM | POA: Diagnosis not present

## 2016-09-10 ENCOUNTER — Encounter: Payer: Self-pay | Admitting: Vascular Surgery

## 2016-09-11 DIAGNOSIS — N2581 Secondary hyperparathyroidism of renal origin: Secondary | ICD-10-CM | POA: Diagnosis not present

## 2016-09-11 DIAGNOSIS — N186 End stage renal disease: Secondary | ICD-10-CM | POA: Diagnosis not present

## 2016-09-11 DIAGNOSIS — D631 Anemia in chronic kidney disease: Secondary | ICD-10-CM | POA: Diagnosis not present

## 2016-09-11 DIAGNOSIS — D509 Iron deficiency anemia, unspecified: Secondary | ICD-10-CM | POA: Diagnosis not present

## 2016-09-13 ENCOUNTER — Ambulatory Visit: Payer: Medicare Other | Admitting: Vascular Surgery

## 2016-09-13 DIAGNOSIS — D631 Anemia in chronic kidney disease: Secondary | ICD-10-CM | POA: Diagnosis not present

## 2016-09-13 DIAGNOSIS — N186 End stage renal disease: Secondary | ICD-10-CM | POA: Diagnosis not present

## 2016-09-13 DIAGNOSIS — N2581 Secondary hyperparathyroidism of renal origin: Secondary | ICD-10-CM | POA: Diagnosis not present

## 2016-09-13 DIAGNOSIS — D509 Iron deficiency anemia, unspecified: Secondary | ICD-10-CM | POA: Diagnosis not present

## 2016-09-16 DIAGNOSIS — N2581 Secondary hyperparathyroidism of renal origin: Secondary | ICD-10-CM | POA: Diagnosis not present

## 2016-09-16 DIAGNOSIS — D631 Anemia in chronic kidney disease: Secondary | ICD-10-CM | POA: Diagnosis not present

## 2016-09-16 DIAGNOSIS — D509 Iron deficiency anemia, unspecified: Secondary | ICD-10-CM | POA: Diagnosis not present

## 2016-09-16 DIAGNOSIS — N186 End stage renal disease: Secondary | ICD-10-CM | POA: Diagnosis not present

## 2016-09-18 ENCOUNTER — Encounter (HOSPITAL_COMMUNITY): Payer: Self-pay | Admitting: *Deleted

## 2016-09-18 ENCOUNTER — Emergency Department (HOSPITAL_COMMUNITY)
Admission: EM | Admit: 2016-09-18 | Discharge: 2016-09-19 | Disposition: A | Discharge status: Home / Self Care | Payer: Medicare Other | Attending: Emergency Medicine | Admitting: Emergency Medicine

## 2016-09-18 DIAGNOSIS — I132 Hypertensive heart and chronic kidney disease with heart failure and with stage 5 chronic kidney disease, or end stage renal disease: Secondary | ICD-10-CM | POA: Diagnosis not present

## 2016-09-18 DIAGNOSIS — R109 Unspecified abdominal pain: Secondary | ICD-10-CM | POA: Diagnosis present

## 2016-09-18 DIAGNOSIS — D631 Anemia in chronic kidney disease: Secondary | ICD-10-CM | POA: Diagnosis not present

## 2016-09-18 DIAGNOSIS — Z87891 Personal history of nicotine dependence: Secondary | ICD-10-CM | POA: Insufficient documentation

## 2016-09-18 DIAGNOSIS — R1084 Generalized abdominal pain: Secondary | ICD-10-CM | POA: Diagnosis not present

## 2016-09-18 DIAGNOSIS — E1122 Type 2 diabetes mellitus with diabetic chronic kidney disease: Secondary | ICD-10-CM | POA: Diagnosis not present

## 2016-09-18 DIAGNOSIS — N186 End stage renal disease: Secondary | ICD-10-CM | POA: Insufficient documentation

## 2016-09-18 DIAGNOSIS — R112 Nausea with vomiting, unspecified: Secondary | ICD-10-CM | POA: Diagnosis not present

## 2016-09-18 DIAGNOSIS — I5042 Chronic combined systolic (congestive) and diastolic (congestive) heart failure: Secondary | ICD-10-CM | POA: Diagnosis not present

## 2016-09-18 DIAGNOSIS — Z79899 Other long term (current) drug therapy: Secondary | ICD-10-CM | POA: Diagnosis not present

## 2016-09-18 DIAGNOSIS — N2581 Secondary hyperparathyroidism of renal origin: Secondary | ICD-10-CM | POA: Diagnosis not present

## 2016-09-18 DIAGNOSIS — T8610 Unspecified complication of kidney transplant: Secondary | ICD-10-CM | POA: Diagnosis not present

## 2016-09-18 DIAGNOSIS — Z992 Dependence on renal dialysis: Secondary | ICD-10-CM | POA: Diagnosis not present

## 2016-09-18 DIAGNOSIS — D509 Iron deficiency anemia, unspecified: Secondary | ICD-10-CM | POA: Diagnosis not present

## 2016-09-18 LAB — CBC
HEMATOCRIT: 33.3 % — AB (ref 39.0–52.0)
HEMOGLOBIN: 10.4 g/dL — AB (ref 13.0–17.0)
MCH: 26.7 pg (ref 26.0–34.0)
MCHC: 31.2 g/dL (ref 30.0–36.0)
MCV: 85.6 fL (ref 78.0–100.0)
Platelets: 191 10*3/uL (ref 150–400)
RBC: 3.89 MIL/uL — AB (ref 4.22–5.81)
RDW: 17.1 % — ABNORMAL HIGH (ref 11.5–15.5)
WBC: 5 10*3/uL (ref 4.0–10.5)

## 2016-09-18 LAB — COMPREHENSIVE METABOLIC PANEL
ALBUMIN: 3.5 g/dL (ref 3.5–5.0)
ALT: 9 U/L — AB (ref 17–63)
ANION GAP: 14 (ref 5–15)
AST: 21 U/L (ref 15–41)
Alkaline Phosphatase: 185 U/L — ABNORMAL HIGH (ref 38–126)
BUN: 18 mg/dL (ref 6–20)
CALCIUM: 8.4 mg/dL — AB (ref 8.9–10.3)
CO2: 29 mmol/L (ref 22–32)
Chloride: 94 mmol/L — ABNORMAL LOW (ref 101–111)
Creatinine, Ser: 7.77 mg/dL — ABNORMAL HIGH (ref 0.61–1.24)
GFR calc Af Amer: 8 mL/min — ABNORMAL LOW (ref >60)
GFR calc non Af Amer: 7 mL/min — ABNORMAL LOW (ref >60)
GLUCOSE: 157 mg/dL — AB (ref 65–99)
POTASSIUM: 4.2 mmol/L (ref 3.5–5.1)
SODIUM: 137 mmol/L (ref 135–145)
TOTAL PROTEIN: 7.6 g/dL (ref 6.5–8.1)
Total Bilirubin: 0.3 mg/dL (ref 0.3–1.2)

## 2016-09-18 LAB — TROPONIN I: TROPONIN I: 0.03 ng/mL — AB (ref <0.03)

## 2016-09-18 NOTE — ED Notes (Addendum)
Troponin of 0.03. Charge rn aware.

## 2016-09-18 NOTE — ED Triage Notes (Signed)
Pt presents with generalized abdominal pain and NV x 2 days with poor PO intake. Pt is a dialysis patient, last treatment today, did have a full treatment. Also reports blood pressure has been running over 239 systolically at dialysis.

## 2016-09-18 NOTE — ED Notes (Signed)
Pt now c/o chest pain, ekg completed, troponin added on

## 2016-09-19 MED ORDER — ONDANSETRON 4 MG PO TBDP: 4.0000 mg | ORAL_TABLET | Freq: Three times a day (TID) | ORAL | 0 refills | Status: DC | PRN

## 2016-09-19 MED ORDER — ONDANSETRON 4 MG PO TBDP
4.0000 mg | ORAL_TABLET | Freq: Once | ORAL | Status: AC
  Administered 2016-09-19: 4 mg via ORAL
  Filled 2016-09-19: qty 1

## 2016-09-19 MED ORDER — MORPHINE SULFATE (PF) 4 MG/ML IV SOLN
6.0000 mg | Freq: Once | INTRAVENOUS | Status: AC
  Administered 2016-09-19: 6 mg via INTRAMUSCULAR
  Filled 2016-09-19: qty 2

## 2016-09-19 MED ORDER — OXYCODONE-ACETAMINOPHEN 5-325 MG PO TABS
1.0000 | ORAL_TABLET | Freq: Once | ORAL | Status: AC
  Administered 2016-09-19: 1 via ORAL
  Filled 2016-09-19: qty 1

## 2016-09-19 NOTE — ED Provider Notes (Signed)
[facility] DEPT Provider Note   CSN: [id number] Arrival date & time: 09/18/16  2136  By signing my name below, I, Jaquelyn Bitter., attest that this documentation has been prepared under the direction and in the presence of Everlene Balls, MD. Electronically signed: Jaquelyn Bitter., ED Scribe. 09/19/16. 12:04 AM.   History   Chief Complaint Chief Complaint  Patient presents with  . Abdominal Pain    HPI Frank [name] is a 51 y.o. male with hx of AAA, chronic kidney failure  who presents to the Emergency Department complaining of constant, worsening moderate to severe abdominal pain with sudden onset x2 days. Pt states that x2 weeks ago he was diagnosed with a ruptured AAA and has had HTN since. He states the he has had abdominal pain onset x2 days ago and he has had associated nausea, vomiting. He reports back pain. Pt denies any modifying factors. He denies diarrhea. Of note, pt is a dialysis patient and received his last full treatment today.Pt also expresses concern of elevated systolic blood pressure over 200 which pt noticed at dialysis today. Pt has surgery scheduled to repair his AAA next x1 month.   The history is provided by the patient. No language interpreter was used.    Past Medical History:  Diagnosis Date  . Anemia   . Anxiety   . Chronic combined systolic and diastolic CHF (congestive heart failure) (HCC)    a. EF 20-25% by echo in 08/2015 b. echo 10/2015: EF 35-40%, diffuse HK, severe LAE, moderate RAE, small pericardial effusion  . Complication of anesthesia    itching, sore throat  . Depression   . Dialysis patient ([place])   . ESRD (end stage renal disease) ([place])    due to HTN per patient, followed at [facility], s/p failed kidney transplant - dialysis Tue, Th, Sat  . Hyperkalemia 12/2015  . Hypertension   . Junctional rhythm    a. noted in 08/2015: hyperkalemic at that time  b. 12/2015: presented in junctional rhythm w/ K+ of 6.6. Resolved with  improvement of K+ levels.  . Nonischemic cardiomyopathy ([place])    a. 08/2014: cath showing minimal CAD, but tortuous arteries noted.   . Personal history of DVT (deep vein thrombosis)/ PE 05/26/2016   In Oct 2015 had small subsemental LUL PE w/o DVT (LE dopplers neg) and was felt to be HD cath related, treated w coumadin.  IN May 2016 had small vein DVT (acute/subacute) in the R basilic/ brachial veins of the RUE, resumed on coumadin.  Had R sided HD cath at that time.    . Renal insufficiency   . Shortness of breath   . Type II diabetes mellitus (HCC)    No history per patient, but remains under history as A1c would not be accurate given on dialysis    Patient Active Problem List   Diagnosis Date Noted  . Hypertensive emergency   . Dissection of other specified artery ([place]) 08/04/2016  . Abdominal pain 08/04/2016  . End-stage renal disease on hemodialysis ([place])   . Subtherapeutic international normalized ratio (INR)   . Hypertension   . Problem with dialysis access ([place]) 06/22/2016  . ESRD (end stage renal disease) on dialysis ([place])   . ESRD (end stage renal disease) ([name]) 06/20/2016  . ESRD needing dialysis ([facility]) 06/14/2016  . Uremia 06/07/2016  . Hyperkalemia 05/29/2016  . Chronic back pain 05/29/2016  . GERD (gastroesophageal reflux disease) 05/29/2016  . Personal history of DVT (deep  vein thrombosis)/ PE 05/26/2016  . Bradycardia 03/31/2016  . Nonischemic cardiomyopathy ([place]) 01/09/2016  . Bilateral low back pain without sciatica   . Left renal mass 10/30/2015  . Constipation 10/30/2015  . Acute on chronic combined systolic and diastolic congestive heart failure ([place]) 09/23/2015  . Hypertensive urgency 09/08/2015  . Adjustment disorder with mixed anxiety and depressed mood 08/20/2015  . Chronic epididymitis 06/19/2015  . Groin pain, chronic, right 06/19/2015  . Incarcerated right inguinal hernia 02/16/2015  . Essential hypertension 01/02/2015  . Dyslipidemia   . ESRD on  hemodialysis ([place])   . Anemia of chronic kidney failure 06/24/2013    Past Surgical History:  Procedure Laterality Date  . CAPD INSERTION    . CAPD REMOVAL    . INGUINAL HERNIA REPAIR Right 02/14/2015   Procedure: REPAIR INCARCERATED RIGHT INGUINAL HERNIA;  Surgeon: [name], MD;  Location: [facility];  Service: General;  Laterality: Right;  . INSERTION OF DIALYSIS CATHETER Right 09/23/2015   Procedure: exchange of Right internal Dialysis Catheter.;  Surgeon: [name], MD;  Location: [facility];  Service: Vascular;  Laterality: Right;  . IR GENERIC HISTORICAL  07/16/2016   IR US GUIDE VASC ACCESS LEFT 07/16/2016 Corrie Mckusick, DO [facility]-INTER[facility] RAD  . IR GENERIC HISTORICAL Left 07/16/2016   IR THROMBECTOMY AV FISTULA W/THROMBOLYSIS/PTA INC/SHUNT/IMG LEFT 07/16/2016 Corrie Mckusick, DO [facility]  . KIDNEY RECEIPIENT  2006   failed and started HD in March 2014  . LEFT HEART CATHETERIZATION WITH CORONARY ANGIOGRAM N/A 09/02/2014   Procedure: LEFT HEART CATHETERIZATION WITH CORONARY ANGIOGRAM;  Surgeon: [name], MD;  Location: [facility] CATH LAB;  Service: Cardiovascular;  Laterality: N/A;       Home Medications    Prior to Admission medications   Medication Sig Start Date End Date Taking? Authorizing Provider  acetaminophen-codeine (TYLENOL #3) 300-30 MG tablet Take 1 tablet by mouth every 6 (six) hours as needed. 08/09/16   Tresa Garter, MD  allopurinol (ZYLOPRIM) 100 MG tablet Take 100 mg by mouth daily.    Historical Provider, MD  ALPRAZolam [name]) 1 MG tablet Take 1 tablet (1 mg total) by mouth at bedtime as needed for sleep. 08/13/16   Okey Regal, PA-C  amLODipine (NORVASC) 5 MG tablet Take 1 tablet (5 mg total) by mouth at bedtime. 08/08/16   Nita Sells, MD  atorvastatin (LIPITOR) 40 MG tablet Take 1 tablet (40 mg total) by mouth daily at 6 PM. 09/02/14   Barton Dubois, MD  carvedilol (COREG) 25 MG tablet Take 1 tablet (25 mg total) by mouth 2 (two) times daily with a  meal. 08/08/16   Nita Sells, MD  cinacalcet (SENSIPAR) 30 MG tablet Take 30 mg by mouth every evening.     Historical Provider, MD  clonazePAM (KLONOPIN) 1 MG tablet Take 1 mg by mouth 2 (two) times daily.    Historical Provider, MD  cloNIDine (CATAPRES - DOSED IN MG/24 HR) 0.3 mg/24hr patch Place 1 patch (0.3 mg total) onto the skin every Monday. 08/19/16   Shawnee Knapp, MD  doxazosin (CARDURA) 2 MG tablet Take 1 tablet (2 mg total) by mouth daily. 08/08/16   Nita Sells, MD  famotidine (PEPCID) 20 MG tablet Take 1 tablet (20 mg total) by mouth 2 (two) times daily. 08/08/16   Nita Sells, MD  gabapentin (NEURONTIN) 100 MG capsule Take 1 capsule (100 mg total) by mouth at bedtime. Patient taking differently: Take 100 mg by mouth 3 (three) times daily.  05/21/16  [name], DO  HYDROcodone-acetaminophen (NORCO/VICODIN) 5-325 MG tablet Take 1 tablet by mouth every 6 (six) hours as needed for moderate pain.    Historical Provider, MD  isosorbide mononitrate (IMDUR) 60 MG 24 hr tablet Take 1 tablet (60 mg total) by mouth daily. 08/08/16   Nita Sells, MD  methocarbamol (ROBAXIN) 500 MG tablet Take 1 tablet (500 mg total) by mouth 2 (two) times daily as needed for muscle spasms. 08/08/16   Nita Sells, MD  omeprazole (PRILOSEC) 20 MG capsule Take 1 capsule (20 mg total) by mouth daily. 08/08/16   Nita Sells, MD  ondansetron (ZOFRAN ODT) 4 MG disintegrating tablet Take 1 tablet (4 mg total) by mouth every 8 (eight) hours as needed for nausea or vomiting. 05/16/16   Sherwood Gambler, MD  oxycodone (OXY-IR) 5 MG capsule Take 1 capsule (5 mg total) by mouth every 4 (four) hours as needed for pain. 05/30/16   [name], MD  oxyCODONE-acetaminophen (PERCOCET/ROXICET) 5-325 MG tablet Take 1 tablet by mouth every 6 (six) hours as needed for severe pain.    Historical Provider, MD  senna-docusate (SENOKOT-S) 8.6-50 MG tablet Take 1 tablet by mouth at bedtime as needed  for mild constipation. 08/08/16   Nita Sells, MD  sevelamer carbonate (RENVELA) 800 MG tablet Take 1,600 mg by mouth 3 (three) times daily with meals.     Historical Provider, MD    Family History Family History  Problem Relation Age of Onset  . Hypertension Other     Social History Social History  Substance Use Topics  . Smoking status: Former Smoker    Packs/day: 0.00    Years: 1.00    Types: Cigarettes  . Smokeless tobacco: Never Used     Comment: quit Jan 2014  . Alcohol use No     Allergies   Butalbital-apap-caffeine; Ferrlecit [na ferric gluc cplx in sucrose]; Minoxidil; Tylenol [acetaminophen]; and Darvocet [propoxyphene n-acetaminophen]   Review of Systems Review of Systems  A complete 10 system review of systems was obtained and all systems are negative except as noted in the HPI and PMH.   Physical Exam Updated Vital Signs BP (!) 166/101 (BP Location: Right Arm)   Pulse 88   Temp 98.9 F (37.2 C) (Oral)   Resp 16   SpO2 100%   Physical Exam  Constitutional: He is oriented to person, place, and time. Vital signs are normal. He appears well-developed and well-nourished.  Non-toxic appearance. He does not appear ill. No distress.  HENT:  Head: Normocephalic and atraumatic.  Nose: Nose normal.  Mouth/Throat: Oropharynx is clear and moist. No oropharyngeal exudate.  Eyes: Conjunctivae and EOM are normal. Pupils are equal, round, and reactive to light. No scleral icterus.  Neck: Normal range of motion. Neck supple. No tracheal deviation, no edema, no erythema and normal range of motion present. No thyroid mass and no thyromegaly present.  Cardiovascular: Normal rate, regular rhythm, S1 normal, S2 normal, normal heart sounds, intact distal pulses and normal pulses.  Exam reveals no gallop and no friction rub.   No murmur heard. Pulmonary/Chest: Effort normal and breath sounds normal. No respiratory distress. He has no wheezes. He has no rhonchi. He has no  rales.  Abdominal: Soft. Normal appearance and bowel sounds are normal. He exhibits no distension, no ascites and no mass. There is no hepatosplenomegaly. There is no tenderness. There is no rebound, no guarding and no CVA tenderness.  Musculoskeletal: Normal range of motion. He exhibits no edema  or tenderness.  L upper extremity AV fistula with palpable thrill.   Lymphadenopathy:    He has no cervical adenopathy.  Neurological: He is alert and oriented to person, place, and time. He has normal strength. No cranial nerve deficit or sensory deficit.  Normal strength and sensation in all extremities, normal cerebellar testing.   Skin: Skin is warm, dry and intact. No petechiae and no rash noted. He is not diaphoretic. No erythema. No pallor.  Nursing note and vitals reviewed.    ED Treatments / Results   DIAGNOSTIC STUDIES: Oxygen Saturation is 100% on RA, normal by my interpretation.   COORDINATION OF CARE: 12:05 AM-Discussed next steps with pt. Pt verbalized understanding and is agreeable with the plan.    Labs (all labs ordered are listed, but only abnormal results are displayed) Labs Reviewed  COMPREHENSIVE METABOLIC PANEL - Abnormal; Notable for the following:       Result Value   Chloride 94 (*)    Glucose, Bld 157 (*)    Creatinine, Ser 7.77 (*)    Calcium 8.4 (*)    ALT 9 (*)    Alkaline Phosphatase 185 (*)    GFR calc non Af Amer 7 (*)    GFR calc Af Amer 8 (*)    All other components within normal limits  CBC - Abnormal; Notable for the following:    RBC 3.89 (*)    Hemoglobin 10.4 (*)    HCT 33.3 (*)    RDW 17.1 (*)    All other components within normal limits  TROPONIN I - Abnormal; Notable for the following:    Troponin I 0.03 (*)    All other components within normal limits    EKG  EKG Interpretation  Date/Time:  Wednesday September 18 2016 22:16:49 EST Ventricular Rate:  83 PR Interval:  172 QRS Duration: 88 QT Interval:  430 QTC  Calculation: 505 R Axis:   -23 Text Interpretation:  Normal sinus rhythm Possible Left atrial enlargement T wave abnormality, consider lateral ischemia Prolonged QT Abnormal ECG No significant change since last tracing Confirmed by Glynn Octave [phone]) on 09/18/2016 11:50:00 PM       Radiology No results found.  Procedures Procedures (including critical care time)  Medications Ordered in ED Medications - No data to display   Initial Impression / Assessment and Plan / ED Course  I have reviewed the triage vital signs and the nursing notes.  Pertinent labs & imaging results that were available during my care of the patient were reviewed by me and considered in my medical decision making (see chart for details).     Patient presents to the ED for abdominal pain and vomiting. He states it is due to his ruptured AAA but there is no evidence of this in our system.  Prior CTA do not reveal any abnormalities of the aorta that he is describing.  Labs are normal.  Will treat his symptoms with IM morphine and zofran. Will continue to reassess. Patient is agreeable with the plan.   1:02 AM 2nd dose of morphine ordered  2:33 AM Patient now states his symptoms have resolved and feels better and ready to go home.  VS remain within his normal limits.  Patient is safe for DC.  Final Clinical Impressions(s) / ED Diagnoses   Final diagnoses:  None    New Prescriptions New Prescriptions   No medications on file     I personally performed the services described in this  documentation, which was scribed in my presence. The recorded information has been reviewed and is accurate.       Everlene Balls, MD 09/19/16 [phone]

## 2016-09-20 DIAGNOSIS — N2581 Secondary hyperparathyroidism of renal origin: Secondary | ICD-10-CM | POA: Diagnosis not present

## 2016-09-20 DIAGNOSIS — D631 Anemia in chronic kidney disease: Secondary | ICD-10-CM | POA: Diagnosis not present

## 2016-09-20 DIAGNOSIS — D509 Iron deficiency anemia, unspecified: Secondary | ICD-10-CM | POA: Diagnosis not present

## 2016-09-20 DIAGNOSIS — N186 End stage renal disease: Secondary | ICD-10-CM | POA: Diagnosis not present

## 2016-09-23 DIAGNOSIS — D509 Iron deficiency anemia, unspecified: Secondary | ICD-10-CM | POA: Diagnosis not present

## 2016-09-23 DIAGNOSIS — N186 End stage renal disease: Secondary | ICD-10-CM | POA: Diagnosis not present

## 2016-09-23 DIAGNOSIS — D631 Anemia in chronic kidney disease: Secondary | ICD-10-CM | POA: Diagnosis not present

## 2016-09-23 DIAGNOSIS — N2581 Secondary hyperparathyroidism of renal origin: Secondary | ICD-10-CM | POA: Diagnosis not present

## 2016-09-23 IMAGING — CT CT NECK W/ CM
3 of 4 series · 12 of 33 positions shown, 14 images · IV contrast (Omnipaque 300)
Comparison: None available.

CLINICAL DATA: Initial evaluation for acute sore throat, difficulty
swallowing, difficulty breathing, neck stiffness.

EXAM:
CT NECK WITH CONTRAST
TECHNIQUE: Multidetector CT imaging of the neck was performed using the
standard protocol following the bolus administration of intravenous
contrast.
CONTRAST:  75mL OMNIPAQUE IOHEXOL 300 MG/ML  SOLN

[Series 2: soft tissue neck 2.0 b31s · axial · 0.50mm/px · z∈[+108,+292]mm · 4 of 138 slices shown, 5 images]
[im 23/138  soft-tissue]
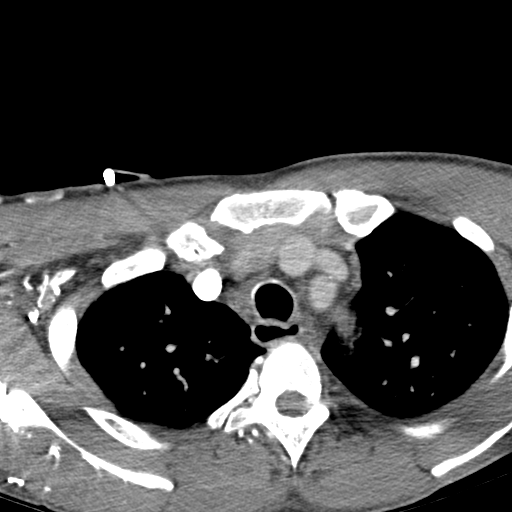
[im 23/138  bone]
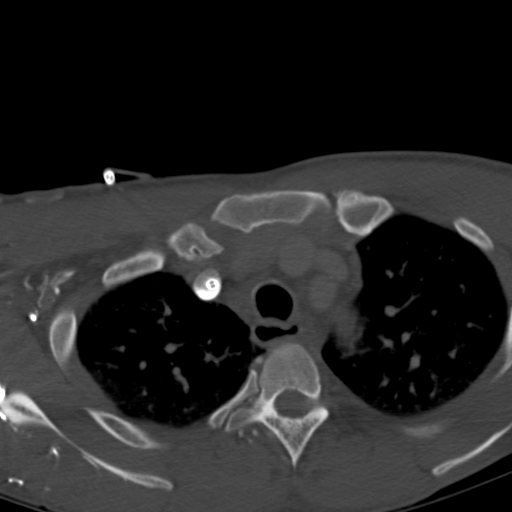
[im 46/138  bone]
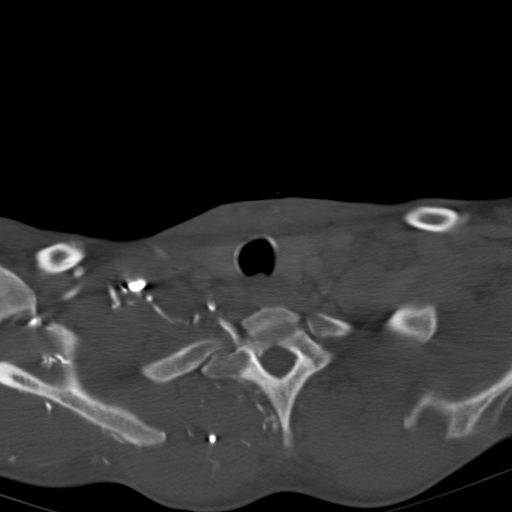
[im 92/138  bone]
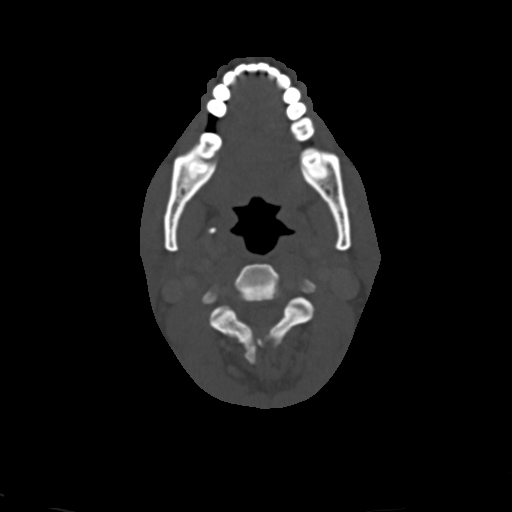
[im 115/138  bone]
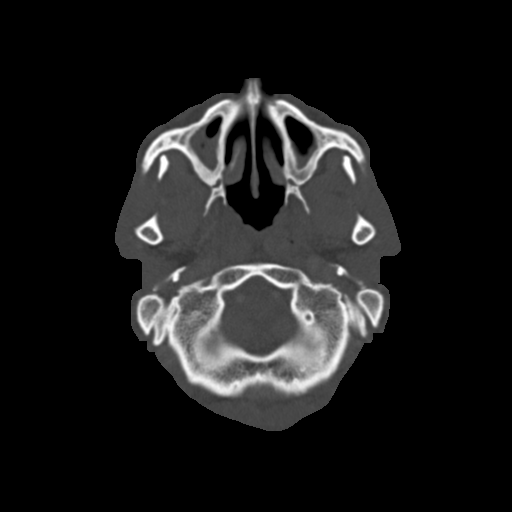

[Series 4: neck 2.0 soft tissue sag · sagittal · 0.43mm/px · 5 of 73 slices shown, 6 images]
[im 25/73  bone]
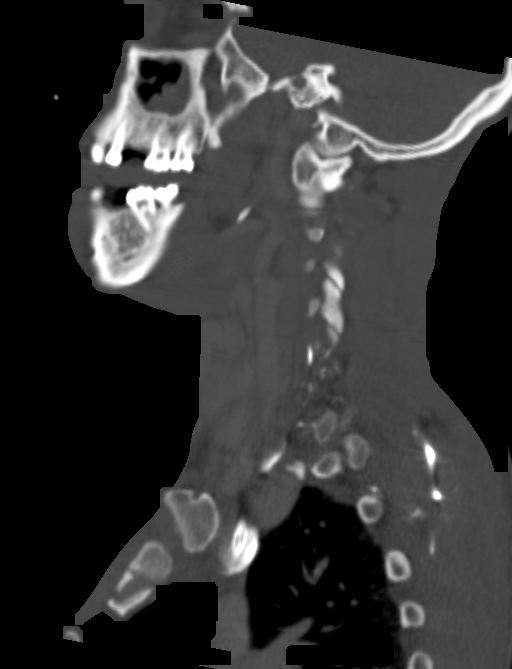
[im 31/73  bone]
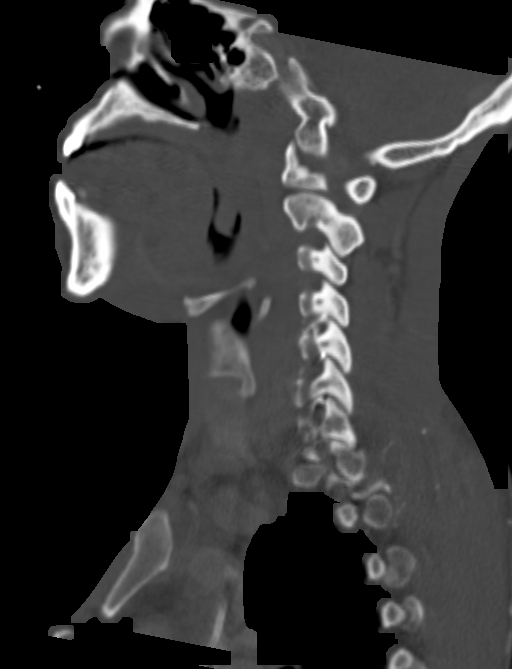
[im 37/73  soft-tissue]
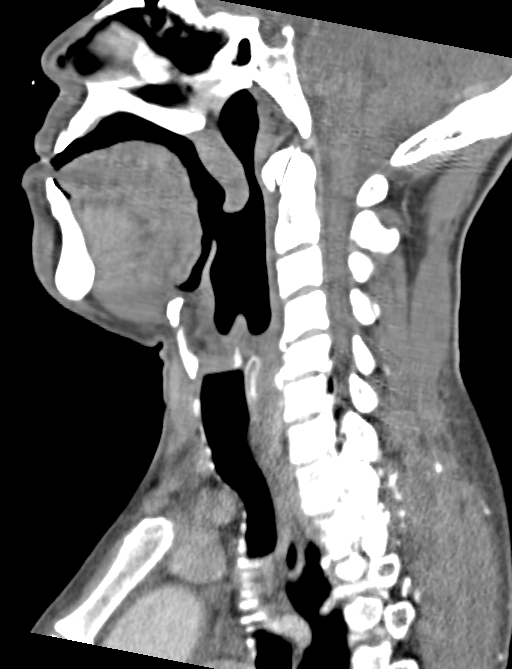
[im 37/73  bone]
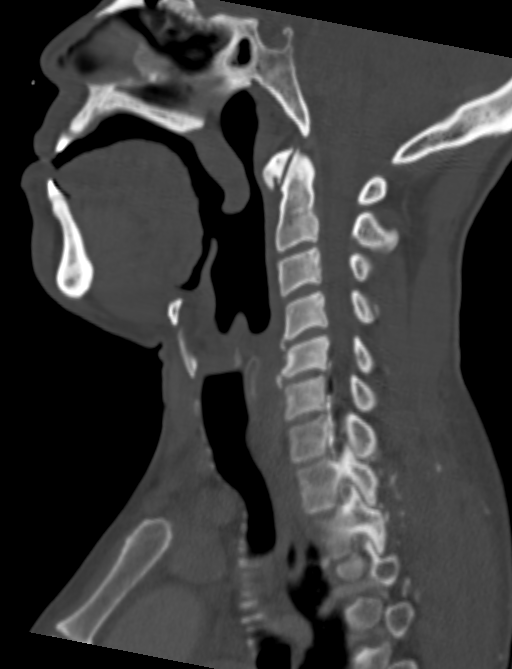
[im 43/73  bone]
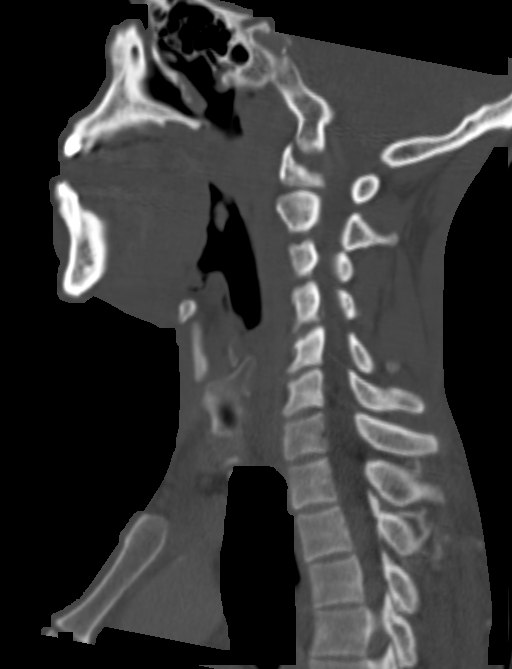
[im 49/73  bone]
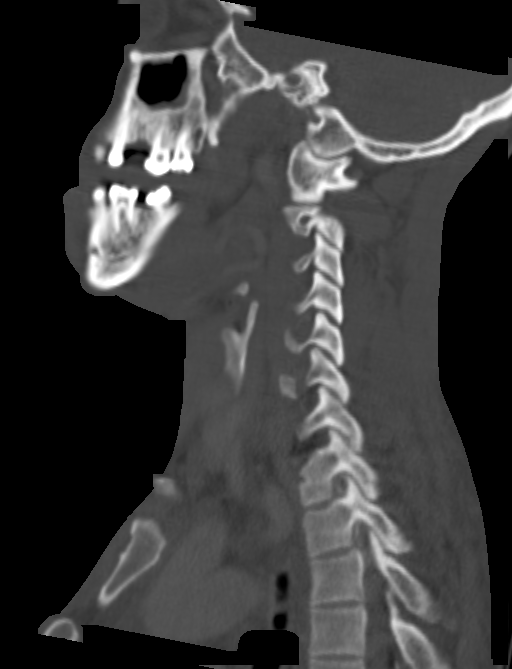

[Series 5: neck 2.0 soft tissue coro · coronal · 0.34mm/px · 3 of 90 slices shown]
[im 21/90  bone]
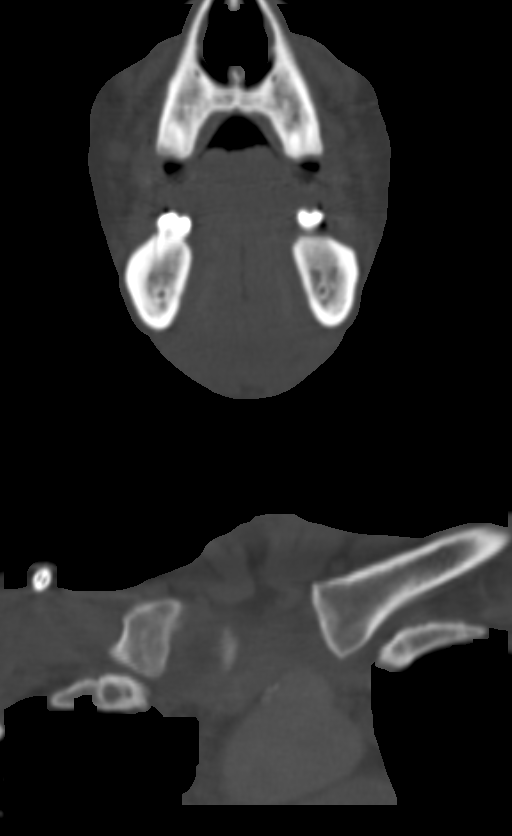
[im 37/90  bone]
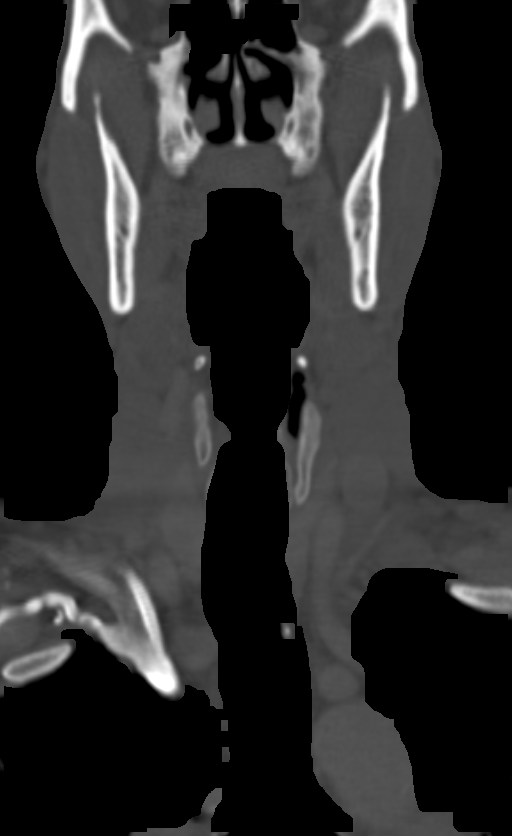
[im 53/90  bone]
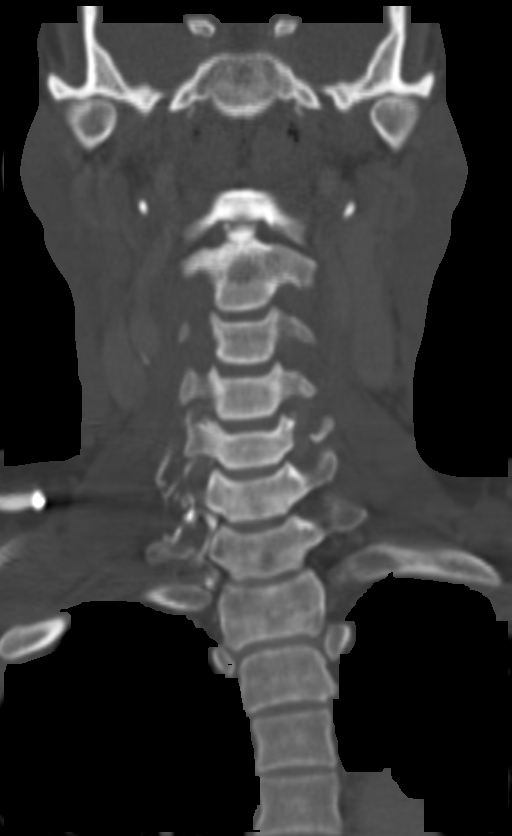

[12 of 33 positions shown; findings below may reference images not displayed]

FINDINGS: Visualized brain and posterior fossa are within normal limits.

Visualized orbits are unremarkable.

Sequelae of chronic sinus disease seen about the maxillary and
sphenoid sinuses. There is moderate mucoperiosteal thickening within
the maxillary sinuses bilaterally. Mastoid air cells are
underpneumatized.

The salivary glands including the parotid glands and submandibular
glands are within normal limits.

Oral cavity is unremarkable without evidence of mass lesion or
loculated fluid collection. Palatine tonsils are symmetric without
acute abnormality. Parapharyngeal fat is preserved. Oropharynx and
nasopharynx within normal limits. No retropharyngeal fluid
collection. Hypopharynx and supraglottic larynx are within normal
limits. Airway is widely patent. Epiglottis normal. Vallecula is
clear. Lingual tonsils within normal limits.

True vocal cords are symmetric bilaterally. Subglottic airway is
clear.

Thyroid gland within normal limits.

No adenopathy identified within the neck. Enlarged mediastinal lymph
nodes measuring up to 1.6 cm in short axis seen near the carina
(series 6, image 137).

Partially visualized lungs are clear.

Normal intravascular enhancement seen throughout the neck.

No acute osseous abnormality. No worrisome lytic or blastic osseous
lesions.
IMPRESSION: 1. No acute abnormality identified within the neck.
2. Enlarged mediastinal lymph nodes measuring up to 1.6 cm in short
axis. Findings are of uncertain etiology, and are incompletely
evaluated on this exam. No cervical adenopathy identified.

## 2016-09-24 IMAGING — CR DG CHEST 2V
1 series · 1 of 1 positions shown · non-contrast
Comparison: Prior radiograph from 04/15/2014

CLINICAL DATA: Initial evaluation for shortness of breath. History
of hypertension, end-stage renal disease.

EXAM:
CHEST  2 VIEW

[w chest lat]
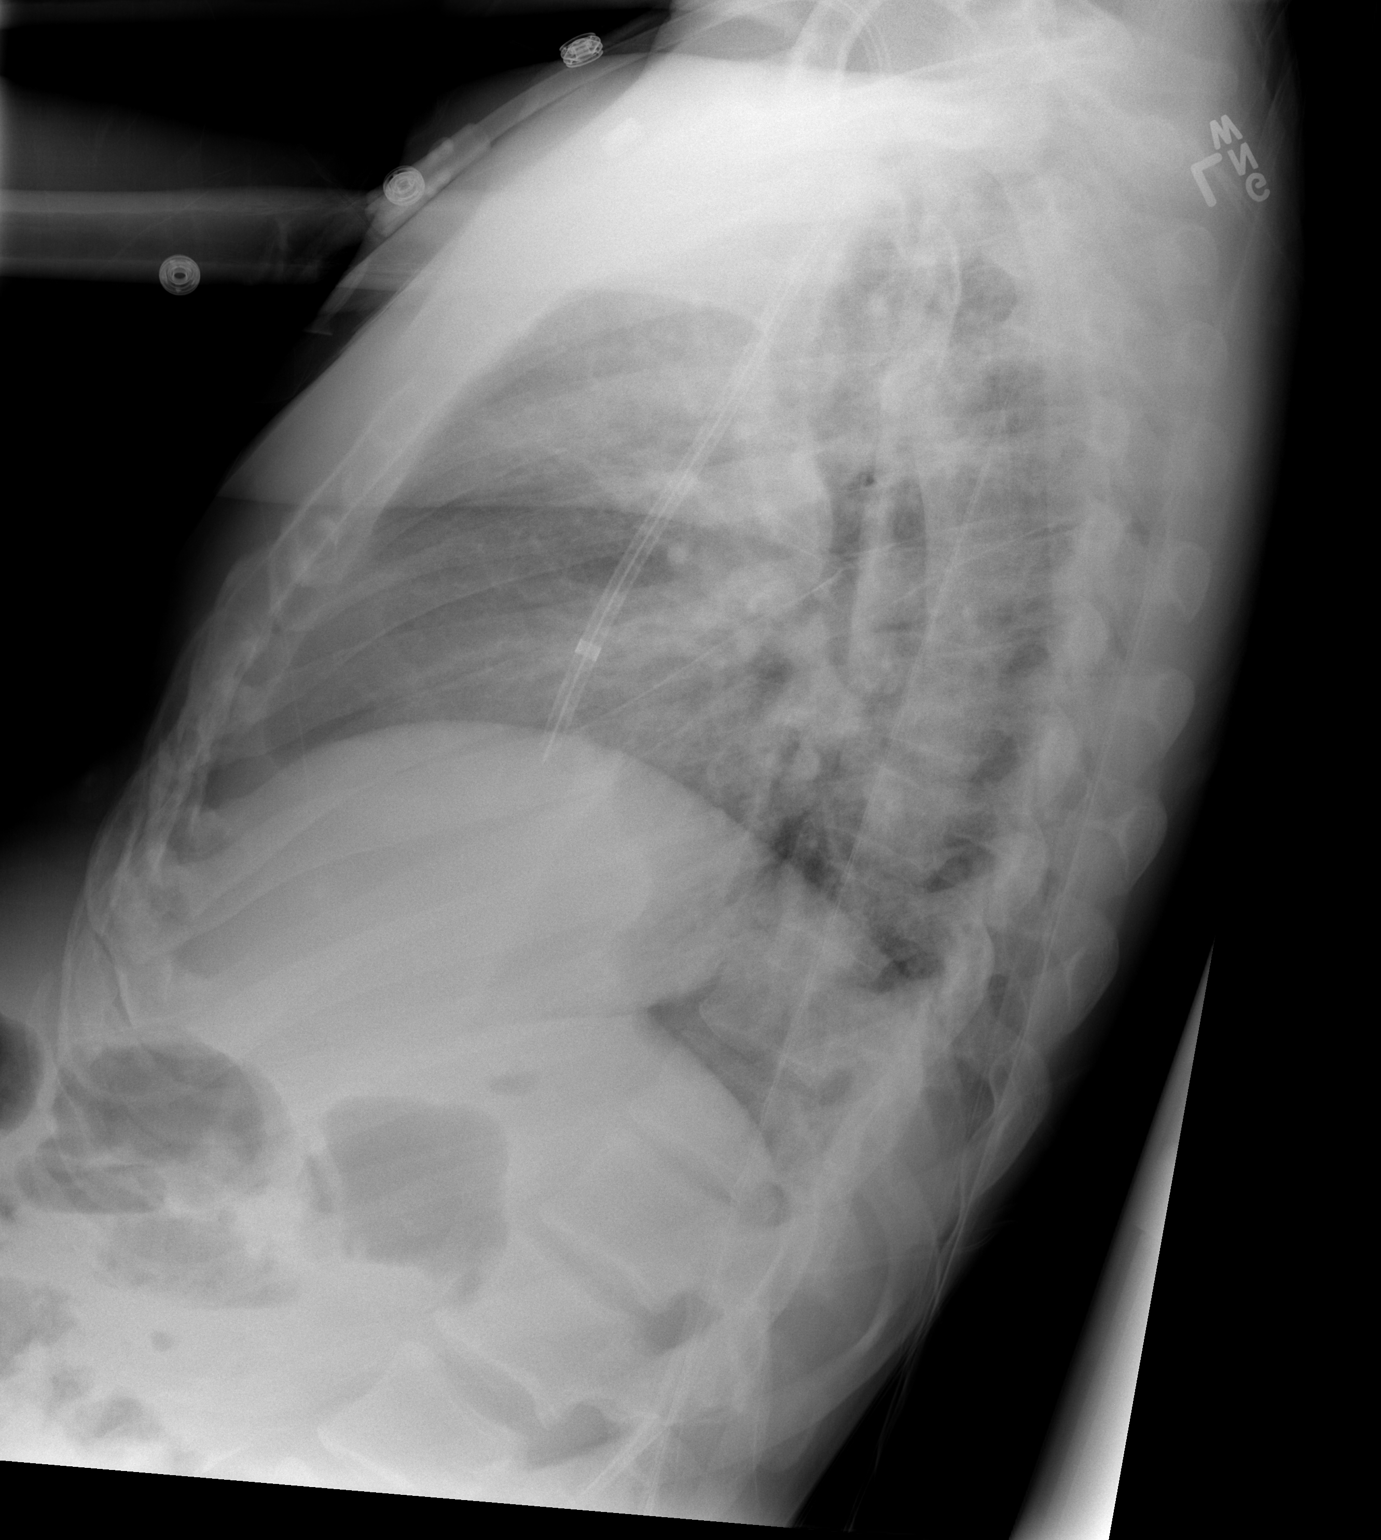

[1 of 1 positions shown; findings below may reference images not displayed]

FINDINGS: Right-sided hemodialysis catheter is stable. Severe cardiomegaly is
grossly unchanged. Mediastinal silhouette within normal limits.
Tortuosity intrathoracic aorta noted.

Lungs are normally inflated. There is diffuse pulmonary vascular
congestion without overt pulmonary edema. No pleural effusion. No
focal infiltrate to suggest acute infectious pneumonitis. There is
no pneumothorax.

No acute osseous abnormality.
IMPRESSION: Stable cardiomegaly with mild diffuse pulmonary vascular congestion
without overt pulmonary edema.

## 2016-09-25 DIAGNOSIS — D509 Iron deficiency anemia, unspecified: Secondary | ICD-10-CM | POA: Diagnosis not present

## 2016-09-25 DIAGNOSIS — N186 End stage renal disease: Secondary | ICD-10-CM | POA: Diagnosis not present

## 2016-09-25 DIAGNOSIS — D631 Anemia in chronic kidney disease: Secondary | ICD-10-CM | POA: Diagnosis not present

## 2016-09-25 DIAGNOSIS — N2581 Secondary hyperparathyroidism of renal origin: Secondary | ICD-10-CM | POA: Diagnosis not present

## 2016-09-26 ENCOUNTER — Emergency Department (HOSPITAL_COMMUNITY): Payer: Medicare Other

## 2016-09-26 ENCOUNTER — Emergency Department (HOSPITAL_COMMUNITY)
Admission: EM | Admit: 2016-09-26 | Discharge: 2016-09-27 | Disposition: A | Discharge status: Home / Self Care | Payer: Medicare Other | Attending: Emergency Medicine | Admitting: Emergency Medicine

## 2016-09-26 ENCOUNTER — Encounter (HOSPITAL_COMMUNITY): Payer: Self-pay | Admitting: Emergency Medicine

## 2016-09-26 DIAGNOSIS — Z992 Dependence on renal dialysis: Secondary | ICD-10-CM | POA: Diagnosis not present

## 2016-09-26 DIAGNOSIS — R109 Unspecified abdominal pain: Secondary | ICD-10-CM | POA: Diagnosis not present

## 2016-09-26 DIAGNOSIS — R1011 Right upper quadrant pain: Secondary | ICD-10-CM | POA: Diagnosis not present

## 2016-09-26 DIAGNOSIS — E1122 Type 2 diabetes mellitus with diabetic chronic kidney disease: Secondary | ICD-10-CM | POA: Diagnosis not present

## 2016-09-26 DIAGNOSIS — Z79899 Other long term (current) drug therapy: Secondary | ICD-10-CM | POA: Diagnosis not present

## 2016-09-26 DIAGNOSIS — N281 Cyst of kidney, acquired: Secondary | ICD-10-CM | POA: Diagnosis not present

## 2016-09-26 DIAGNOSIS — I132 Hypertensive heart and chronic kidney disease with heart failure and with stage 5 chronic kidney disease, or end stage renal disease: Secondary | ICD-10-CM | POA: Diagnosis not present

## 2016-09-26 DIAGNOSIS — I5042 Chronic combined systolic (congestive) and diastolic (congestive) heart failure: Secondary | ICD-10-CM | POA: Insufficient documentation

## 2016-09-26 DIAGNOSIS — R1013 Epigastric pain: Secondary | ICD-10-CM | POA: Diagnosis not present

## 2016-09-26 DIAGNOSIS — Z87891 Personal history of nicotine dependence: Secondary | ICD-10-CM | POA: Insufficient documentation

## 2016-09-26 DIAGNOSIS — K529 Noninfective gastroenteritis and colitis, unspecified: Secondary | ICD-10-CM | POA: Diagnosis not present

## 2016-09-26 DIAGNOSIS — N186 End stage renal disease: Secondary | ICD-10-CM | POA: Diagnosis not present

## 2016-09-26 LAB — COMPREHENSIVE METABOLIC PANEL
ALK PHOS: 162 U/L — AB (ref 38–126)
ALT: 10 U/L — AB (ref 17–63)
AST: 22 U/L (ref 15–41)
Albumin: 3.7 g/dL (ref 3.5–5.0)
Anion gap: 14 (ref 5–15)
BILIRUBIN TOTAL: 0.9 mg/dL (ref 0.3–1.2)
BUN: 32 mg/dL — ABNORMAL HIGH (ref 6–20)
CO2: 28 mmol/L (ref 22–32)
CREATININE: 10.03 mg/dL — AB (ref 0.61–1.24)
Calcium: 8.6 mg/dL — ABNORMAL LOW (ref 8.9–10.3)
Chloride: 97 mmol/L — ABNORMAL LOW (ref 101–111)
GFR, EST AFRICAN AMERICAN: 6 mL/min — AB (ref >60)
GFR, EST NON AFRICAN AMERICAN: 5 mL/min — AB (ref >60)
Glucose, Bld: 88 mg/dL (ref 65–99)
Potassium: 4.2 mmol/L (ref 3.5–5.1)
Sodium: 139 mmol/L (ref 135–145)
Total Protein: 8.3 g/dL — ABNORMAL HIGH (ref 6.5–8.1)

## 2016-09-26 LAB — CBC
HCT: 38.8 % — ABNORMAL LOW (ref 39.0–52.0)
Hemoglobin: 12.4 g/dL — ABNORMAL LOW (ref 13.0–17.0)
MCH: 27.3 pg (ref 26.0–34.0)
MCHC: 32 g/dL (ref 30.0–36.0)
MCV: 85.3 fL (ref 78.0–100.0)
PLATELETS: 215 10*3/uL (ref 150–400)
RBC: 4.55 MIL/uL (ref 4.22–5.81)
RDW: 16.8 % — AB (ref 11.5–15.5)
WBC: 6.9 10*3/uL (ref 4.0–10.5)

## 2016-09-26 LAB — LIPASE, BLOOD: Lipase: 34 U/L (ref 11–51)

## 2016-09-26 IMAGING — CR DG CHEST 2V
2 series · 2 of 2 positions shown · non-contrast
Comparison: 05/06/2014.

CLINICAL DATA: Shortness of breath.  Initial encounter.

EXAM:
CHEST  2 VIEW

[w chest pa]
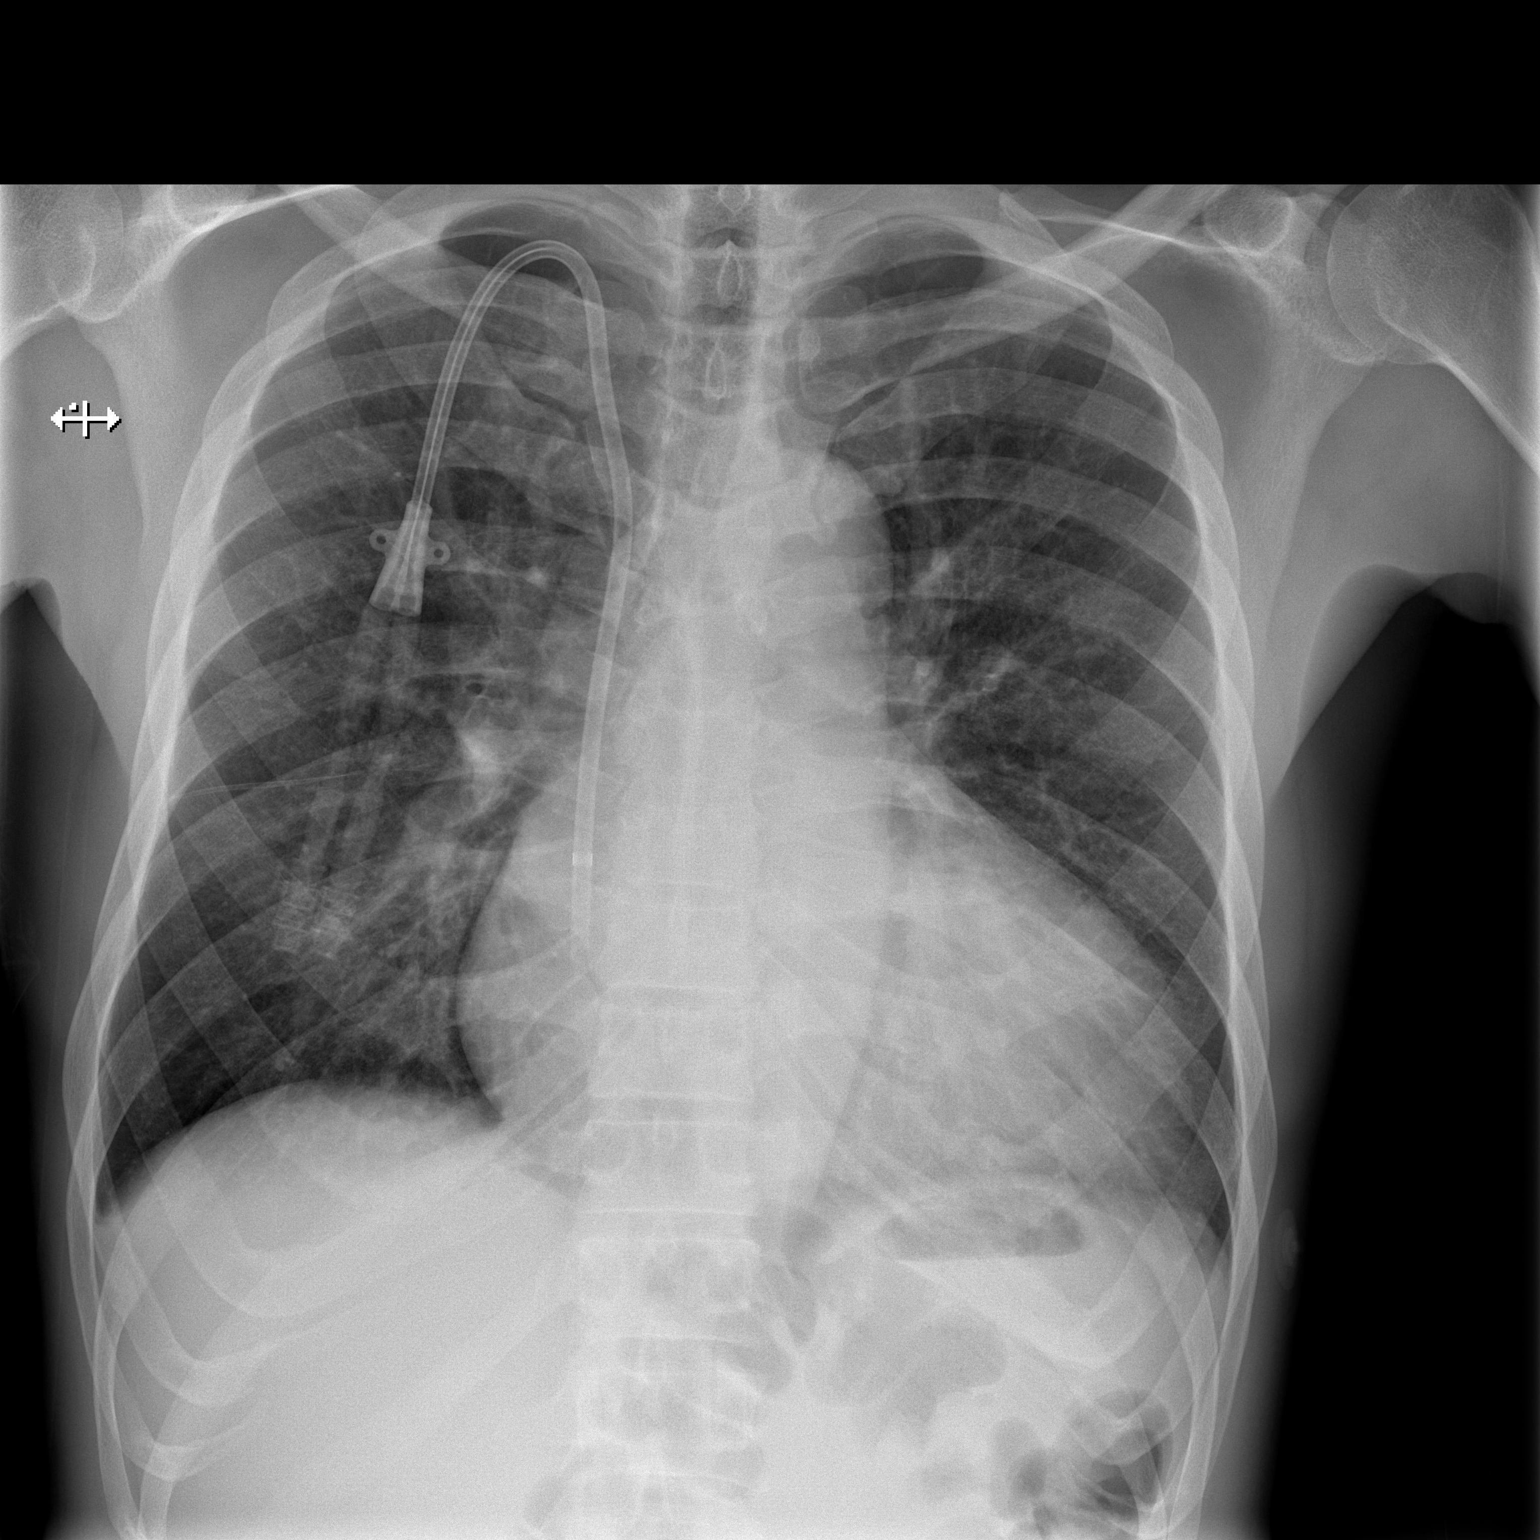

[w chest lat]
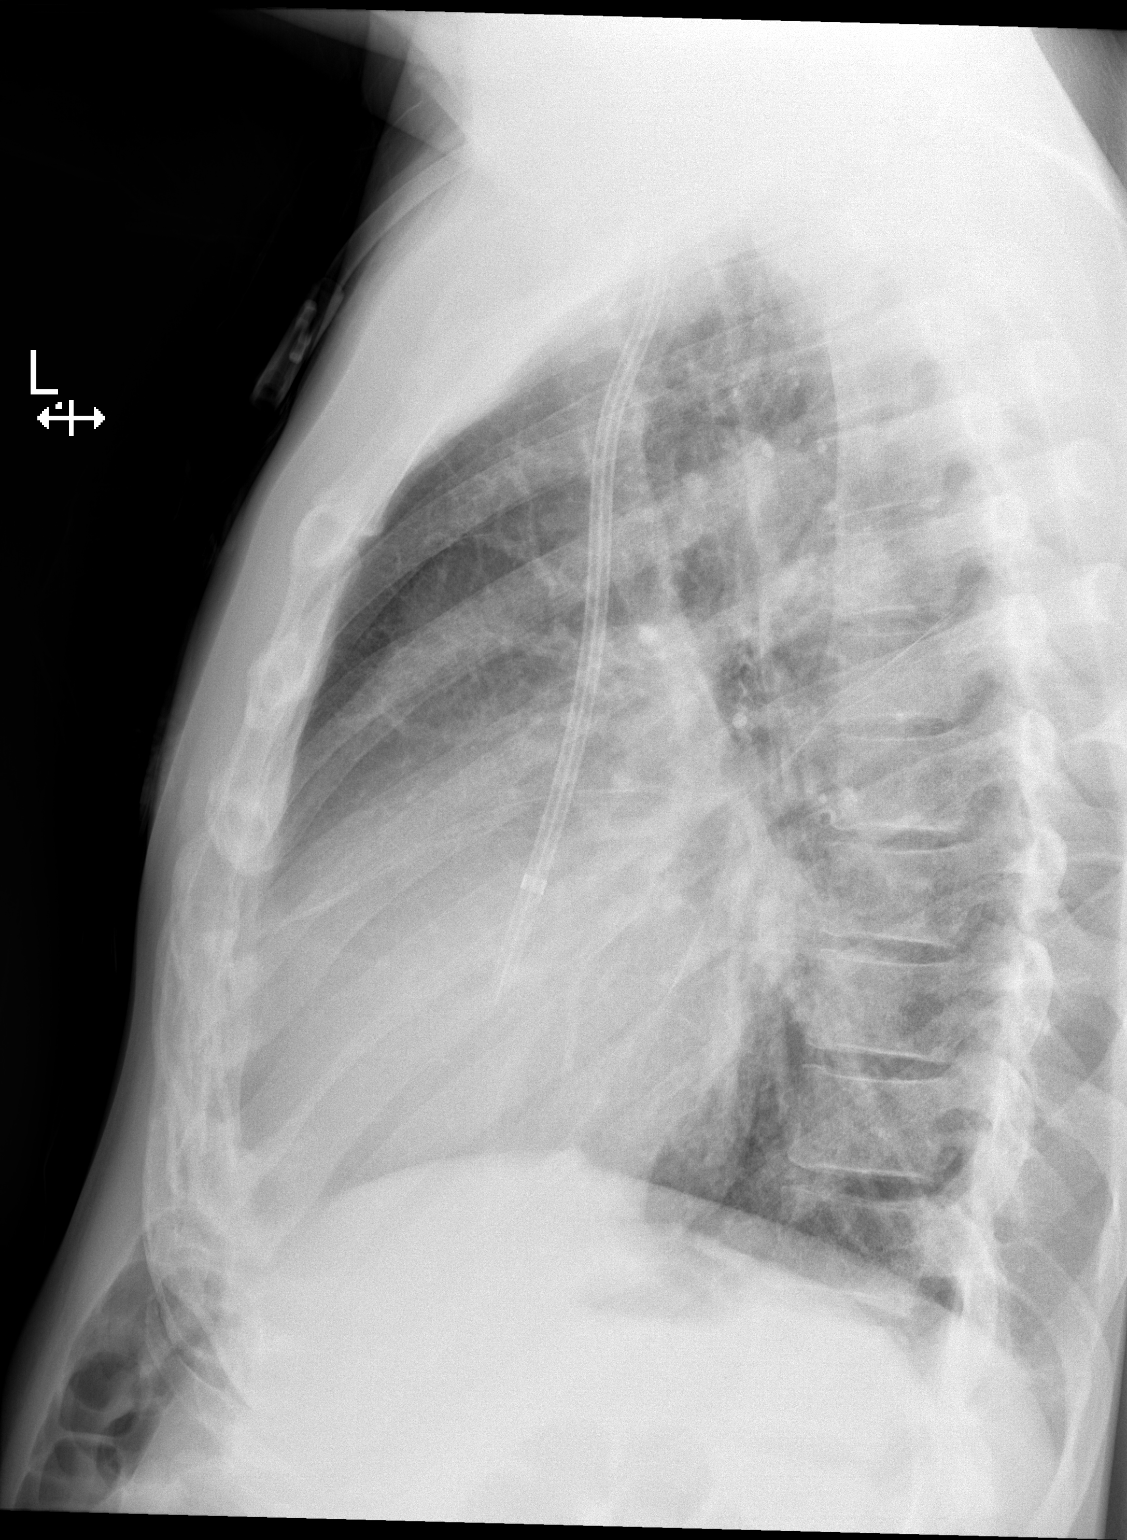

[2 of 2 positions shown; findings below may reference images not displayed]

FINDINGS: There is moderate cardiomegaly. Aortic contours are unremarkable and
stable.

There is no edema, consolidation, effusion, or pneumothorax.

Stable positioning of dialysis catheter via right IJ approach. The
tip is at the upper right atrium.
IMPRESSION: Cardiomegaly without failure.

## 2016-09-26 MED ORDER — PROMETHAZINE HCL 25 MG/ML IJ SOLN
25.0000 mg | Freq: Once | INTRAMUSCULAR | Status: AC
  Administered 2016-09-26: 25 mg via INTRAMUSCULAR
  Filled 2016-09-26: qty 1

## 2016-09-26 MED ORDER — ONDANSETRON 4 MG PO TBDP
ORAL_TABLET | ORAL | Status: AC
  Administered 2016-09-26: 4 mg via ORAL
  Filled 2016-09-26: qty 1

## 2016-09-26 MED ORDER — MORPHINE SULFATE (PF) 4 MG/ML IV SOLN
4.0000 mg | Freq: Once | INTRAVENOUS | Status: AC
  Administered 2016-09-26: 4 mg via INTRAMUSCULAR
  Filled 2016-09-26: qty 1

## 2016-09-26 MED ORDER — ONDANSETRON 4 MG PO TBDP
4.0000 mg | ORAL_TABLET | Freq: Once | ORAL | Status: AC | PRN
  Administered 2016-09-26: 4 mg via ORAL

## 2016-09-26 NOTE — ED Triage Notes (Signed)
Pt states that he has been having generalized abd pain that radiates to the back with nausea and vomiting for 2 days that has gotten worse.

## 2016-09-26 NOTE — ED Provider Notes (Signed)
[facility] DEPT Provider Note   CSN: [id number] Arrival date & time: 09/26/16  1840 By signing my name below, I, Frank [name], attest that this documentation has been prepared under the direction and in the presence of Frank Chapel, MD . Electronically Signed: Dyke [name], Scribe. 09/26/2016. 9:55 PM.   History   Chief Complaint Chief Complaint  Patient presents with  . Abdominal Pain  . Emesis    HPI Frank [name] is a 51 y.o. male with a history of CHF, ESRD on dialysis, HTN and DM type 2 who presents to the Emergency Department complaining of gradual onset, gradually worsening epigastric and RUQ pain which began yesterday morning. He describes this as severe pain that radiates to his back and is exacerbated by eating. No alleviating factors noted. No attempted treatments indicated. Pt states he was unable to make it through his dialysis appointment  due to the pain. He notes associated persistent nausea, vomiting, chills, and subjective fever. He denies any alcohol use. No SHX of cholecystectomy. No hx of kidney stones or pancreatitis. Pt denies any diarrhea and has no other complaints at his time.  Per chart review, pt was seen in the ED 9 days ago and one month prior to that for abdominal  pain  On 09/18/16, pt reported that he was dx with a ruptured AAA and has surgery scheduled to repair AAA next month. Prior CT's of abdomen reveal no signs of aneurysm, despite his claim. On 07/27/16, pt had CT angiogram that showed no evidence of disection or aneurysm.   The history is provided by the patient and medical records. No language interpreter was used.   Past Medical History:  Diagnosis Date  . Anemia   . Anxiety   . Chronic combined systolic and diastolic CHF (congestive heart failure) (HCC)    a. EF 20-25% by echo in 08/2015 b. echo 10/2015: EF 35-40%, diffuse HK, severe LAE, moderate RAE, small pericardial effusion  . Complication of anesthesia    itching, sore throat  .  Depression   . Dialysis patient ([place])   . ESRD (end stage renal disease) ([place])    due to HTN per patient, followed at [facility], s/p failed kidney transplant - dialysis Tue, Th, Sat  . Hyperkalemia 12/2015  . Hypertension   . Junctional rhythm    a. noted in 08/2015: hyperkalemic at that time  b. 12/2015: presented in junctional rhythm w/ K+ of 6.6. Resolved with improvement of K+ levels.  . Nonischemic cardiomyopathy ([place])    a. 08/2014: cath showing minimal CAD, but tortuous arteries noted.   . Personal history of DVT (deep vein thrombosis)/ PE 05/26/2016   In Oct 2015 had small subsemental LUL PE w/o DVT (LE dopplers neg) and was felt to be HD cath related, treated w coumadin.  IN May 2016 had small vein DVT (acute/subacute) in the R basilic/ brachial veins of the RUE, resumed on coumadin.  Had R sided HD cath at that time.    . Renal insufficiency   . Shortness of breath   . Type II diabetes mellitus (HCC)    No history per patient, but remains under history as A1c would not be accurate given on dialysis    Patient Active Problem List   Diagnosis Date Noted  . Hypertensive emergency   . Dissection of other specified artery ([place]) 08/04/2016  . Abdominal pain 08/04/2016  . End-stage renal disease on hemodialysis ([place])   . Subtherapeutic international normalized ratio (INR)   .  Hypertension   . Problem with dialysis access ([place]) 06/22/2016  . ESRD (end stage renal disease) on dialysis ([place])   . ESRD (end stage renal disease) ([place]) 06/20/2016  . ESRD needing dialysis ([place]) 06/14/2016  . Uremia 06/07/2016  . Hyperkalemia 05/29/2016  . Chronic back pain 05/29/2016  . GERD (gastroesophageal reflux disease) 05/29/2016  . Personal history of DVT (deep vein thrombosis)/ PE 05/26/2016  . Bradycardia 03/31/2016  . Nonischemic cardiomyopathy ([place]) 01/09/2016  . Bilateral low back pain without sciatica   . Left renal mass 10/30/2015  . Constipation 10/30/2015  . Acute on chronic combined  systolic and diastolic congestive heart failure ([place]) 09/23/2015  . Hypertensive urgency 09/08/2015  . Adjustment disorder with mixed anxiety and depressed mood 08/20/2015  . Chronic epididymitis 06/19/2015  . Groin pain, chronic, right 06/19/2015  . Incarcerated right inguinal hernia 02/16/2015  . Essential hypertension 01/02/2015  . Dyslipidemia   . ESRD on hemodialysis ([place])   . Anemia of chronic kidney failure 06/24/2013    Past Surgical History:  Procedure Laterality Date  . CAPD INSERTION    . CAPD REMOVAL    . INGUINAL HERNIA REPAIR Right 02/14/2015   Procedure: REPAIR INCARCERATED RIGHT INGUINAL HERNIA;  Surgeon: [name], MD;  Location: [facility];  Service: General;  Laterality: Right;  . INSERTION OF DIALYSIS CATHETER Right 09/23/2015   Procedure: exchange of Right internal Dialysis Catheter.;  Surgeon: [name], MD;  Location: [facility];  Service: Vascular;  Laterality: Right;  . IR GENERIC HISTORICAL  07/16/2016   IR US GUIDE VASC ACCESS LEFT 07/16/2016 Corrie Mckusick, DO [facility]-INTER[facility]  . IR GENERIC HISTORICAL Left 07/16/2016   IR THROMBECTOMY AV FISTULA W/THROMBOLYSIS/PTA INC/SHUNT/IMG LEFT 07/16/2016 Corrie Mckusick, DO [facility]  . KIDNEY RECEIPIENT  2006   failed and started HD in March 2014  . LEFT HEART CATHETERIZATION WITH CORONARY ANGIOGRAM N/A 09/02/2014   Procedure: LEFT HEART CATHETERIZATION WITH CORONARY ANGIOGRAM;  Surgeon: [name], MD;  Location: [facility] CATH LAB;  Service: Cardiovascular;  Laterality: N/A;     Home Medications    Prior to Admission medications   Medication Sig Start Date End Date Taking? Authorizing Provider  acetaminophen-codeine (TYLENOL #3) 300-30 MG tablet Take 1 tablet by mouth every 6 (six) hours as needed. 08/09/16   Tresa Garter, MD  allopurinol (ZYLOPRIM) 100 MG tablet Take 100 mg by mouth daily.    Historical Provider, MD  ALPRAZolam [name]) 1 MG tablet Take 1 tablet (1 mg total) by mouth at bedtime as needed for sleep.  08/13/16   Okey Regal, PA-C  amLODipine (NORVASC) 5 MG tablet Take 1 tablet (5 mg total) by mouth at bedtime. 08/08/16   Nita Sells, MD  atorvastatin (LIPITOR) 40 MG tablet Take 1 tablet (40 mg total) by mouth daily at 6 PM. 09/02/14   Barton Dubois, MD  carvedilol (COREG) 25 MG tablet Take 1 tablet (25 mg total) by mouth 2 (two) times daily with a meal. 08/08/16   Nita Sells, MD  cinacalcet (SENSIPAR) 30 MG tablet Take 30 mg by mouth every evening.     Historical Provider, MD  clonazePAM (KLONOPIN) 1 MG tablet Take 1 mg by mouth 2 (two) times daily.    Historical Provider, MD  cloNIDine (CATAPRES - DOSED IN MG/24 HR) 0.3 mg/24hr patch Place 1 patch (0.3 mg total) onto the skin every Monday. 08/19/16   Shawnee Knapp, MD  doxazosin (CARDURA) 2 MG tablet Take 1 tablet (2 mg total)  by mouth daily. 08/08/16   Nita Sells, MD  famotidine (PEPCID) 20 MG tablet Take 1 tablet (20 mg total) by mouth 2 (two) times daily. 08/08/16   Nita Sells, MD  gabapentin (NEURONTIN) 100 MG capsule Take 1 capsule (100 mg total) by mouth at bedtime. Patient taking differently: Take 100 mg by mouth 3 (three) times daily.  05/21/16   [name], DO  HYDROcodone-acetaminophen (NORCO/VICODIN) 5-325 MG tablet Take 1 tablet by mouth every 6 (six) hours as needed for moderate pain.    Historical Provider, MD  isosorbide mononitrate (IMDUR) 60 MG 24 hr tablet Take 1 tablet (60 mg total) by mouth daily. 08/08/16   Nita Sells, MD  methocarbamol (ROBAXIN) 500 MG tablet Take 1 tablet (500 mg total) by mouth 2 (two) times daily as needed for muscle spasms. 08/08/16   Nita Sells, MD  omeprazole (PRILOSEC) 20 MG capsule Take 1 capsule (20 mg total) by mouth daily. 08/08/16   Nita Sells, MD  ondansetron (ZOFRAN ODT) 4 MG disintegrating tablet Take 1 tablet (4 mg total) by mouth every 8 (eight) hours as needed for nausea or vomiting. 09/19/16   Everlene Balls, MD  oxycodone (OXY-IR) 5 MG  capsule Take 1 capsule (5 mg total) by mouth every 4 (four) hours as needed for pain. 05/30/16   [name], MD  oxyCODONE-acetaminophen (PERCOCET/ROXICET) 5-325 MG tablet Take 1 tablet by mouth every 6 (six) hours as needed for severe pain.    Historical Provider, MD  senna-docusate (SENOKOT-S) 8.6-50 MG tablet Take 1 tablet by mouth at bedtime as needed for mild constipation. 08/08/16   Nita Sells, MD  sevelamer carbonate (RENVELA) 800 MG tablet Take 1,600 mg by mouth 3 (three) times daily with meals.     Historical Provider, MD    Family History Family History  Problem Relation Age of Onset  . Hypertension Other     Social History Social History  Substance Use Topics  . Smoking status: Former Smoker    Packs/day: 0.00    Years: 1.00    Types: Cigarettes  . Smokeless tobacco: Never Used     Comment: quit Jan 2014  . Alcohol use No     Allergies   Butalbital-apap-caffeine; Ferrlecit [na ferric gluc cplx in sucrose]; Minoxidil; Tylenol [acetaminophen]; and Darvocet [propoxyphene n-acetaminophen]   Review of Systems Review of Systems  Constitutional: Positive for chills and fever.  Gastrointestinal: Positive for abdominal pain, nausea and vomiting. Negative for diarrhea.  Musculoskeletal: Positive for back pain.  All other systems reviewed and are negative.   Physical Exam Updated Vital Signs BP (!) 187/114 (BP Location: Right Arm)   Pulse 101   Temp 98.7 F (37.1 C) (Oral)   Resp 20   Ht _0  (1.778 m)   Wt 165 lb (74.8 kg)   SpO2 100%   BMI 23.68 kg/m   Physical Exam  Constitutional: He appears well-developed and well-nourished. No distress.  Appears uncomfortable  HENT:  Head: Normocephalic and atraumatic.  Mouth/Throat: Oropharynx is clear and moist. No oropharyngeal exudate.  Eyes: Conjunctivae and EOM are normal. Pupils are equal, round, and reactive to light. Right eye exhibits no discharge. Left eye exhibits no discharge. No scleral icterus.    Neck: Normal range of motion. Neck supple. No JVD present. No thyromegaly present.  Cardiovascular: Normal rate, regular rhythm, normal heart sounds and intact distal pulses.  Exam reveals no gallop and no friction rub.   No murmur heard. Pulmonary/Chest: Effort normal and breath  sounds normal. No respiratory distress. He has no wheezes. He has no rales.  Abdominal: Soft. Bowel sounds are normal. He exhibits no distension and no mass. There is tenderness.  Reproducible tenderness epigastric, RUQ and LUQ, but very soft abdomen. No lower abdominal tenderness. No peritoneal signs.   Musculoskeletal: Normal range of motion. He exhibits no edema or tenderness.  Lymphadenopathy:    He has no cervical adenopathy.  Neurological: He is alert. Coordination normal.  Skin: Skin is warm and dry. No rash noted. No erythema.  Psychiatric: He has a normal mood and affect. His behavior is normal.  Nursing note and vitals reviewed.   ED Treatments / Results  DIAGNOSTIC STUDIES:  Oxygen Saturation is 100% on RA, normal by my interpretation.    COORDINATION OF CARE:  9:13 PM Discussed treatment plan which includes phenergan with pt at bedside and pt agreed to plan.  Labs (all labs ordered are listed, but only abnormal results are displayed) Labs Reviewed  COMPREHENSIVE METABOLIC PANEL - Abnormal; Notable for the following:       Result Value   Chloride 97 (*)    BUN 32 (*)    Creatinine, Ser 10.03 (*)    Calcium 8.6 (*)    Total Protein 8.3 (*)    ALT 10 (*)    Alkaline Phosphatase 162 (*)    GFR calc non Af Amer 5 (*)    GFR calc Af Amer 6 (*)    All other components within normal limits  CBC - Abnormal; Notable for the following:    Hemoglobin 12.4 (*)    HCT 38.8 (*)    RDW 16.8 (*)    All other components within normal limits  LIPASE, BLOOD    EKG  EKG Interpretation None       Radiology No results found.  Procedures Procedures (including critical care time)  Medications  Ordered in ED Medications  ondansetron (ZOFRAN-ODT) disintegrating tablet 4 mg (4 mg Oral Given 09/26/16 1901)  promethazine (PHENERGAN) injection 25 mg (25 mg Intramuscular Given 09/26/16 2122)  morphine 4 MG/ML injection 4 mg (4 mg Intramuscular Given 09/26/16 2316)     Initial Impression / Assessment and Plan / ED Course  I have reviewed the triage vital signs and the nursing notes.  Pertinent labs & imaging results that were available during my care of the patient were reviewed by me and considered in my medical decision making (see chart for details).     Changes of shift - care signed out to Dr. Wyvonnia Dusky pending xray Morphine given IM  Final Clinical Impressions(s) / ED Diagnoses   Final diagnoses:  Abdominal pain    New Prescriptions New Prescriptions   No medications on file   I personally performed the services described in this documentation, which was scribed in my presence. The recorded information has been reviewed and is accurate.        Frank Chapel, MD 09/26/16 [phone]

## 2016-09-27 ENCOUNTER — Emergency Department (HOSPITAL_COMMUNITY): Payer: Medicare Other

## 2016-09-27 DIAGNOSIS — R109 Unspecified abdominal pain: Secondary | ICD-10-CM | POA: Diagnosis not present

## 2016-09-27 DIAGNOSIS — N281 Cyst of kidney, acquired: Secondary | ICD-10-CM | POA: Diagnosis not present

## 2016-09-27 DIAGNOSIS — R112 Nausea with vomiting, unspecified: Secondary | ICD-10-CM | POA: Diagnosis not present

## 2016-09-27 LAB — CBG MONITORING, ED: Glucose-Capillary: 82 mg/dL (ref 65–99)

## 2016-09-27 MED ORDER — CARVEDILOL 12.5 MG PO TABS
25.0000 mg | ORAL_TABLET | Freq: Once | ORAL | Status: AC
  Administered 2016-09-27: 25 mg via ORAL
  Filled 2016-09-27: qty 2

## 2016-09-27 MED ORDER — IOPAMIDOL (ISOVUE-300) INJECTION 61%
INTRAVENOUS | Status: AC
  Administered 2016-09-27: 30 mL
  Filled 2016-09-27: qty 30

## 2016-09-27 MED ORDER — ONDANSETRON HCL 4 MG/2ML IJ SOLN
4.0000 mg | Freq: Once | INTRAMUSCULAR | Status: AC
  Administered 2016-09-27: 4 mg via INTRAVENOUS
  Filled 2016-09-27: qty 2

## 2016-09-27 MED ORDER — IOPAMIDOL (ISOVUE-300) INJECTION 61%
100.0000 mL | Freq: Once | INTRAVENOUS | Status: AC | PRN
  Administered 2016-09-27: 100 mL via INTRAVENOUS

## 2016-09-27 MED ORDER — AMLODIPINE BESYLATE 5 MG PO TABS
5.0000 mg | ORAL_TABLET | Freq: Once | ORAL | Status: AC
  Administered 2016-09-27: 5 mg via ORAL
  Filled 2016-09-27: qty 1

## 2016-09-27 MED ORDER — MORPHINE SULFATE (PF) 4 MG/ML IV SOLN
4.0000 mg | Freq: Once | INTRAVENOUS | Status: AC
  Administered 2016-09-27: 4 mg via INTRAVENOUS
  Filled 2016-09-27: qty 1

## 2016-09-27 MED ORDER — ISOSORBIDE MONONITRATE ER 60 MG PO TB24
ORAL_TABLET | ORAL | Status: AC
  Filled 2016-09-27: qty 1

## 2016-09-27 MED ORDER — ISOSORBIDE MONONITRATE ER 60 MG PO TB24
60.0000 mg | ORAL_TABLET | Freq: Once | ORAL | Status: AC
  Administered 2016-09-27: 60 mg via ORAL
  Filled 2016-09-27: qty 1

## 2016-09-27 MED ORDER — ONDANSETRON 4 MG PO TBDP: 4.0000 mg | ORAL_TABLET | Freq: Three times a day (TID) | ORAL | 0 refills | Status: DC | PRN

## 2016-09-27 NOTE — ED Notes (Signed)
PO challenge passed

## 2016-09-27 NOTE — Discharge Instructions (Signed)
Follow up for dialysis as scheduled. Follow up with your primary doctor and Dr. Oneida Alar in the GI office.  Return to the ED if you develop worsening pain, fever, vomiting or any other concerns.

## 2016-09-27 NOTE — ED Provider Notes (Signed)
Care assumed from Dr. Sabra Heck.  ESRD patient with epigastric and RUQ pain with vomiting. History of similar pain in the past.  Did not take BP meds today. AAS pending.  No obstruction seen on AAS.  Patient with soft abdomen, epigastric tenderness, continues to complain of pain.  CT obtained and d/w Dr. Alroy Dust of radiology.  No mechanical obstruction or transition point seen. No small bowel obstruction.  Fluid filled loops, likely enterocolitis.  Patient with diffuse mural thickening similar to previous CTs.  Patient with no vomiting through extended ED stay. He is given his home blood pressure medications as he has not had those today. He is tolerating by mouth.  Discussed need for compliance with BP meds and dialysis schedule. Followup with PCP. Return precautions discussed.   Ezequiel Essex, MD 09/27/16 [phone]

## 2016-09-27 NOTE — ED Notes (Addendum)
BS 82

## 2016-09-28 DIAGNOSIS — E1122 Type 2 diabetes mellitus with diabetic chronic kidney disease: Secondary | ICD-10-CM | POA: Diagnosis not present

## 2016-09-28 DIAGNOSIS — N186 End stage renal disease: Secondary | ICD-10-CM | POA: Diagnosis not present

## 2016-09-28 DIAGNOSIS — Z79899 Other long term (current) drug therapy: Secondary | ICD-10-CM | POA: Diagnosis not present

## 2016-09-28 DIAGNOSIS — Z888 Allergy status to other drugs, medicaments and biological substances status: Secondary | ICD-10-CM | POA: Diagnosis not present

## 2016-09-28 DIAGNOSIS — R0600 Dyspnea, unspecified: Secondary | ICD-10-CM | POA: Diagnosis not present

## 2016-09-28 DIAGNOSIS — Z8719 Personal history of other diseases of the digestive system: Secondary | ICD-10-CM | POA: Diagnosis not present

## 2016-09-28 DIAGNOSIS — R109 Unspecified abdominal pain: Secondary | ICD-10-CM | POA: Diagnosis not present

## 2016-09-28 DIAGNOSIS — Z992 Dependence on renal dialysis: Secondary | ICD-10-CM | POA: Diagnosis not present

## 2016-09-28 DIAGNOSIS — Z9114 Patient's other noncompliance with medication regimen: Secondary | ICD-10-CM | POA: Diagnosis not present

## 2016-09-28 DIAGNOSIS — G8929 Other chronic pain: Secondary | ICD-10-CM | POA: Diagnosis not present

## 2016-09-28 DIAGNOSIS — I120 Hypertensive chronic kidney disease with stage 5 chronic kidney disease or end stage renal disease: Secondary | ICD-10-CM | POA: Diagnosis not present

## 2016-09-28 DIAGNOSIS — K5900 Constipation, unspecified: Secondary | ICD-10-CM | POA: Diagnosis not present

## 2016-09-28 DIAGNOSIS — Z7952 Long term (current) use of systemic steroids: Secondary | ICD-10-CM | POA: Diagnosis not present

## 2016-09-30 DIAGNOSIS — N186 End stage renal disease: Secondary | ICD-10-CM | POA: Diagnosis not present

## 2016-09-30 DIAGNOSIS — D509 Iron deficiency anemia, unspecified: Secondary | ICD-10-CM | POA: Diagnosis not present

## 2016-09-30 DIAGNOSIS — D631 Anemia in chronic kidney disease: Secondary | ICD-10-CM | POA: Diagnosis not present

## 2016-09-30 DIAGNOSIS — N2581 Secondary hyperparathyroidism of renal origin: Secondary | ICD-10-CM | POA: Diagnosis not present

## 2016-09-30 IMAGING — CR DG ABDOMEN 1V
1 series · 1 of 1 positions shown · non-contrast
Comparison: CT abdomen and pelvis 12/21/2013. Single view of the
abdomen 12/05/2013.

CLINICAL DATA: Left-sided abdominal pain and bloating beginning
last night.

EXAM:
ABDOMEN - 1 VIEW

[t abdomen supine]
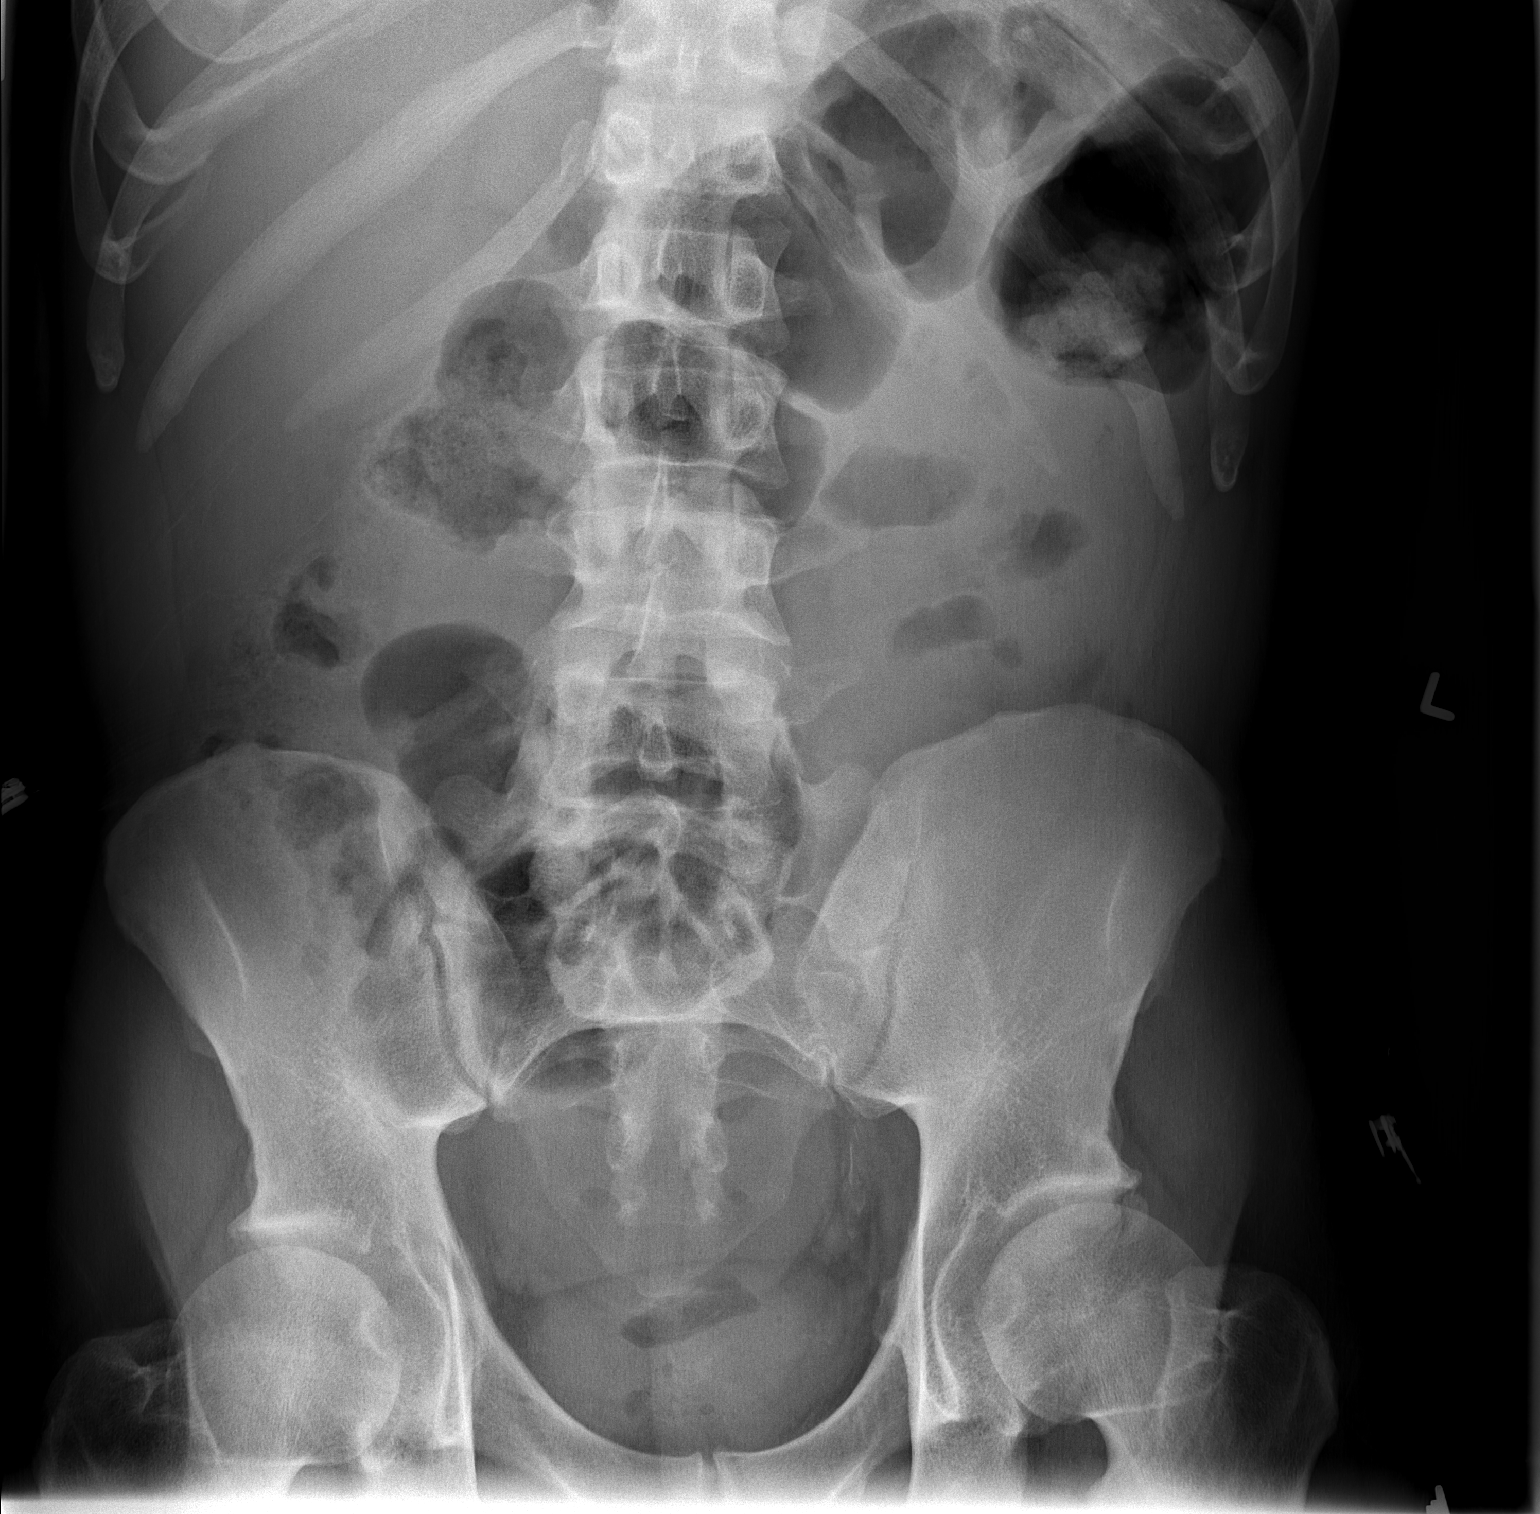

[1 of 1 positions shown; findings below may reference images not displayed]

FINDINGS: The bowel gas pattern is normal. No unexpected abdominal
calcification is seen. No focal bony abnormality is identified.
IMPRESSION: Negative exam.

## 2016-10-02 DIAGNOSIS — N2581 Secondary hyperparathyroidism of renal origin: Secondary | ICD-10-CM | POA: Diagnosis not present

## 2016-10-02 DIAGNOSIS — D509 Iron deficiency anemia, unspecified: Secondary | ICD-10-CM | POA: Diagnosis not present

## 2016-10-02 DIAGNOSIS — N186 End stage renal disease: Secondary | ICD-10-CM | POA: Diagnosis not present

## 2016-10-02 DIAGNOSIS — D631 Anemia in chronic kidney disease: Secondary | ICD-10-CM | POA: Diagnosis not present

## 2016-10-03 ENCOUNTER — Encounter (HOSPITAL_COMMUNITY): Payer: Self-pay | Admitting: Emergency Medicine

## 2016-10-03 DIAGNOSIS — I5043 Acute on chronic combined systolic (congestive) and diastolic (congestive) heart failure: Secondary | ICD-10-CM

## 2016-10-03 DIAGNOSIS — E8889 Other specified metabolic disorders: Secondary | ICD-10-CM | POA: Diagnosis not present

## 2016-10-03 DIAGNOSIS — Z992 Dependence on renal dialysis: Secondary | ICD-10-CM | POA: Insufficient documentation

## 2016-10-03 DIAGNOSIS — R748 Abnormal levels of other serum enzymes: Secondary | ICD-10-CM

## 2016-10-03 DIAGNOSIS — K279 Peptic ulcer, site unspecified, unspecified as acute or chronic, without hemorrhage or perforation: Secondary | ICD-10-CM

## 2016-10-03 DIAGNOSIS — N186 End stage renal disease: Secondary | ICD-10-CM

## 2016-10-03 DIAGNOSIS — E1122 Type 2 diabetes mellitus with diabetic chronic kidney disease: Secondary | ICD-10-CM | POA: Insufficient documentation

## 2016-10-03 DIAGNOSIS — R1011 Right upper quadrant pain: Secondary | ICD-10-CM | POA: Diagnosis not present

## 2016-10-03 DIAGNOSIS — K529 Noninfective gastroenteritis and colitis, unspecified: Secondary | ICD-10-CM | POA: Diagnosis not present

## 2016-10-03 DIAGNOSIS — I132 Hypertensive heart and chronic kidney disease with heart failure and with stage 5 chronic kidney disease, or end stage renal disease: Secondary | ICD-10-CM

## 2016-10-03 DIAGNOSIS — R1084 Generalized abdominal pain: Secondary | ICD-10-CM | POA: Insufficient documentation

## 2016-10-03 DIAGNOSIS — Z87891 Personal history of nicotine dependence: Secondary | ICD-10-CM

## 2016-10-03 DIAGNOSIS — Z79899 Other long term (current) drug therapy: Secondary | ICD-10-CM

## 2016-10-03 DIAGNOSIS — E875 Hyperkalemia: Secondary | ICD-10-CM | POA: Diagnosis not present

## 2016-10-03 DIAGNOSIS — R109 Unspecified abdominal pain: Secondary | ICD-10-CM | POA: Diagnosis not present

## 2016-10-03 LAB — COMPREHENSIVE METABOLIC PANEL
ALBUMIN: 3.3 g/dL — AB (ref 3.5–5.0)
ALT: 9 U/L — ABNORMAL LOW (ref 17–63)
ANION GAP: 15 (ref 5–15)
AST: 22 U/L (ref 15–41)
Alkaline Phosphatase: 170 U/L — ABNORMAL HIGH (ref 38–126)
BUN: 33 mg/dL — ABNORMAL HIGH (ref 6–20)
CALCIUM: 7.7 mg/dL — AB (ref 8.9–10.3)
CO2: 28 mmol/L (ref 22–32)
Chloride: 96 mmol/L — ABNORMAL LOW (ref 101–111)
Creatinine, Ser: 11.11 mg/dL — ABNORMAL HIGH (ref 0.61–1.24)
GFR calc non Af Amer: 5 mL/min — ABNORMAL LOW (ref >60)
GFR, EST AFRICAN AMERICAN: 5 mL/min — AB (ref >60)
GLUCOSE: 93 mg/dL (ref 65–99)
POTASSIUM: 4.8 mmol/L (ref 3.5–5.1)
SODIUM: 139 mmol/L (ref 135–145)
TOTAL PROTEIN: 7.2 g/dL (ref 6.5–8.1)
Total Bilirubin: 0.5 mg/dL (ref 0.3–1.2)

## 2016-10-03 LAB — CBC
HEMATOCRIT: 33.1 % — AB (ref 39.0–52.0)
HEMOGLOBIN: 10.2 g/dL — AB (ref 13.0–17.0)
MCH: 26.8 pg (ref 26.0–34.0)
MCHC: 30.8 g/dL (ref 30.0–36.0)
MCV: 86.9 fL (ref 78.0–100.0)
Platelets: 178 10*3/uL (ref 150–400)
RBC: 3.81 MIL/uL — AB (ref 4.22–5.81)
RDW: 17.9 % — ABNORMAL HIGH (ref 11.5–15.5)
WBC: 5 10*3/uL (ref 4.0–10.5)

## 2016-10-03 LAB — LIPASE, BLOOD: Lipase: 90 U/L — ABNORMAL HIGH (ref 11–51)

## 2016-10-03 NOTE — ED Triage Notes (Signed)
Pt c/o 8/10 abd pain that started on Monday, having nausea and vomiting denies any fever.

## 2016-10-04 ENCOUNTER — Emergency Department (HOSPITAL_COMMUNITY)
Admission: EM | Admit: 2016-10-04 | Discharge: 2016-10-04 | Disposition: A | Discharge status: Home / Self Care | Payer: Medicare Other | Source: Home / Self Care | Attending: Emergency Medicine | Admitting: Emergency Medicine

## 2016-10-04 ENCOUNTER — Emergency Department (HOSPITAL_COMMUNITY): Payer: Medicare Other

## 2016-10-04 DIAGNOSIS — K279 Peptic ulcer, site unspecified, unspecified as acute or chronic, without hemorrhage or perforation: Secondary | ICD-10-CM

## 2016-10-04 DIAGNOSIS — R748 Abnormal levels of other serum enzymes: Secondary | ICD-10-CM

## 2016-10-04 DIAGNOSIS — R109 Unspecified abdominal pain: Secondary | ICD-10-CM

## 2016-10-04 DIAGNOSIS — R1084 Generalized abdominal pain: Secondary | ICD-10-CM

## 2016-10-04 MED ORDER — HYDROMORPHONE HCL 1 MG/ML IJ SOLN
1.0000 mg | Freq: Once | INTRAMUSCULAR | Status: AC
  Administered 2016-10-04: 1 mg via INTRAVENOUS
  Filled 2016-10-04: qty 1

## 2016-10-04 MED ORDER — ONDANSETRON HCL 4 MG/2ML IJ SOLN
4.0000 mg | Freq: Once | INTRAMUSCULAR | Status: AC
Start: 2016-10-04 — End: 2016-10-04
  Administered 2016-10-04: 4 mg via INTRAVENOUS
  Filled 2016-10-04: qty 2

## 2016-10-04 MED ORDER — FAMOTIDINE 20 MG PO TABS: 20.0000 mg | ORAL_TABLET | Freq: Two times a day (BID) | ORAL | 0 refills | Status: DC

## 2016-10-04 MED ORDER — FAMOTIDINE IN NACL 20-0.9 MG/50ML-% IV SOLN
20.0000 mg | Freq: Once | INTRAVENOUS | Status: AC
  Administered 2016-10-04: 20 mg via INTRAVENOUS
  Filled 2016-10-04: qty 50

## 2016-10-04 MED ORDER — IOPAMIDOL (ISOVUE-300) INJECTION 61%
INTRAVENOUS | Status: AC
  Filled 2016-10-04: qty 100

## 2016-10-04 MED ORDER — OXYCODONE-ACETAMINOPHEN 5-325 MG PO TABS
1.0000 | ORAL_TABLET | Freq: Once | ORAL | Status: AC
  Administered 2016-10-04: 1 via ORAL
  Filled 2016-10-04: qty 1

## 2016-10-04 MED ORDER — OXYCODONE-ACETAMINOPHEN 5-325 MG PO TABS: 1.0000 | ORAL_TABLET | Freq: Four times a day (QID) | ORAL | 0 refills | Status: DC | PRN

## 2016-10-04 MED ORDER — SUCRALFATE 1 G PO TABS
1.0000 g | ORAL_TABLET | Freq: Once | ORAL | Status: AC
Start: 2016-10-04 — End: 2016-10-04
  Administered 2016-10-04: 1 g via ORAL
  Filled 2016-10-04: qty 1

## 2016-10-04 NOTE — ED Notes (Signed)
Pt refused for his vitals to be rechecked

## 2016-10-04 NOTE — ED Notes (Signed)
Patient transported to [place]

## 2016-10-04 NOTE — Discharge Instructions (Signed)
See the GI doctor as requested. Take the meds prescribed.  Please return to the ER if your symptoms worsen; you have increased pain, fevers, chills, inability to keep any medications down, confusion. Otherwise see the outpatient doctor as requested.

## 2016-10-04 NOTE — ED Provider Notes (Signed)
[facility] DEPT Provider Note   CSN: [id number] Arrival date & time: 10/03/16  2220   By signing my name below, I, Delton Prairie, attest that this documentation has been prepared under the direction and in the presence of Varney Biles, MD  Electronically Signed: Delton Prairie, ED Scribe. 10/04/16. 3:49 AM.   History   Chief Complaint Chief Complaint  Patient presents with  . Abdominal Pain  . Back Pain   The history is provided by the patient. No language interpreter was used.   HPI Comments:  Frank [name] is a 51 y.o. male, with a PMHx of ESRD and PSHx of kidney transplant, who presents to the Emergency Department complaining of acute onset, constant, moderate abdominal pain which began 2-3 days ago. His pain is worse upon palpation. Pt also reports nausea, 4-5 episodes of vomiting and a decrease in appetite. No alleviating factors noted. Pt denies diarrhea, abnormal bowel movements, melena,  excessive ibuprofen use, heavy drinking, a hx of stomach ulcers, gallbladder issues or any other associated symptoms. Pt is a dialysis pt and dialyzes on M/W/F. No other complaints noted.   Past Medical History:  Diagnosis Date  . Anemia   . Anxiety   . Chronic combined systolic and diastolic CHF (congestive heart failure) (HCC)    a. EF 20-25% by echo in 08/2015 b. echo 10/2015: EF 35-40%, diffuse HK, severe LAE, moderate RAE, small pericardial effusion  . Complication of anesthesia    itching, sore throat  . Depression   . Dialysis patient ([place])   . ESRD (end stage renal disease) ([place])    due to HTN per patient, followed at [facility], s/p failed kidney transplant - dialysis Tue, Th, Sat  . Hyperkalemia 12/2015  . Hypertension   . Junctional rhythm    a. noted in 08/2015: hyperkalemic at that time  b. 12/2015: presented in junctional rhythm w/ K+ of 6.6. Resolved with improvement of K+ levels.  . Nonischemic cardiomyopathy ([place])    a. 08/2014: cath showing minimal CAD, but tortuous  arteries noted.   . Personal history of DVT (deep vein thrombosis)/ PE 05/26/2016   In Oct 2015 had small subsemental LUL PE w/o DVT (LE dopplers neg) and was felt to be HD cath related, treated w coumadin.  IN May 2016 had small vein DVT (acute/subacute) in the R basilic/ brachial veins of the RUE, resumed on coumadin.  Had R sided HD cath at that time.    . Renal insufficiency   . Shortness of breath   . Type II diabetes mellitus (HCC)    No history per patient, but remains under history as A1c would not be accurate given on dialysis    Patient Active Problem List   Diagnosis Date Noted  . Hypertensive emergency   . Dissection of other specified artery ([place]) 08/04/2016  . Abdominal pain 08/04/2016  . End-stage renal disease on hemodialysis ([place])   . Subtherapeutic international normalized ratio (INR)   . Hypertension   . Problem with dialysis access ([place]) 06/22/2016  . ESRD (end stage renal disease) on dialysis ([place])   . ESRD (end stage renal disease) ([place]) 06/20/2016  . ESRD needing dialysis ([place]) 06/14/2016  . Uremia 06/07/2016  . Hyperkalemia 05/29/2016  . Chronic back pain 05/29/2016  . GERD (gastroesophageal reflux disease) 05/29/2016  . Personal history of DVT (deep vein thrombosis)/ PE 05/26/2016  . Bradycardia 03/31/2016  . Nonischemic cardiomyopathy ([place]) 01/09/2016  . Bilateral low back pain without sciatica   . Left  renal mass 10/30/2015  . Constipation 10/30/2015  . Acute on chronic combined systolic and diastolic congestive heart failure ([place]) 09/23/2015  . Hypertensive urgency 09/08/2015  . Adjustment disorder with mixed anxiety and depressed mood 08/20/2015  . Chronic epididymitis 06/19/2015  . Groin pain, chronic, right 06/19/2015  . Incarcerated right inguinal hernia 02/16/2015  . Essential hypertension 01/02/2015  . Dyslipidemia   . ESRD on hemodialysis ([place])   . Anemia of chronic kidney failure 06/24/2013    Past Surgical History:  Procedure Laterality  Date  . CAPD INSERTION    . CAPD REMOVAL    . INGUINAL HERNIA REPAIR Right 02/14/2015   Procedure: REPAIR INCARCERATED RIGHT INGUINAL HERNIA;  Surgeon: [name], MD;  Location: [place];  Service: General;  Laterality: Right;  . INSERTION OF DIALYSIS CATHETER Right 09/23/2015   Procedure: exchange of Right internal Dialysis Catheter.;  Surgeon: [name], MD;  Location: [place];  Service: Vascular;  Laterality: Right;  . IR GENERIC HISTORICAL  07/16/2016   IR US GUIDE VASC ACCESS LEFT 07/16/2016 Corrie Mckusick, DO [facility]-INTERV RAD  . IR GENERIC HISTORICAL Left 07/16/2016   IR THROMBECTOMY AV FISTULA W/THROMBOLYSIS/PTA INC/SHUNT/IMG LEFT 07/16/2016 Corrie Mckusick, DO [facility]-INTER[facility] RAD  . KIDNEY RECEIPIENT  2006   failed and started HD in March 2014  . LEFT HEART CATHETERIZATION WITH CORONARY ANGIOGRAM N/A 09/02/2014   Procedure: LEFT HEART CATHETERIZATION WITH CORONARY ANGIOGRAM;  Surgeon: [name], MD;  Location: [facility] C[facility] LAB;  Service: Cardiovascular;  Laterality: N/A;     Home Medications    Prior to Admission medications   Medication Sig Start Date End Date Taking? Authorizing Provider  acetaminophen-codeine (TYLENOL #3) 300-30 MG tablet Take 1 tablet by mouth every 6 (six) hours as needed. Patient not taking: Reported on 10/05/2016 08/09/16   Tresa Garter, MD  allopurinol (ZYLOPRIM) 100 MG tablet Take 100 mg by mouth daily.    Historical Provider, MD  ALPRAZolam [name]) 1 MG tablet Take 1 tablet (1 mg total) by mouth at bedtime as needed for sleep. Patient not taking: Reported on 10/05/2016 08/13/16   Okey Regal, PA-C  amLODipine (NORVASC) 5 MG tablet Take 1 tablet (5 mg total) by mouth at bedtime. 08/08/16   Nita Sells, MD  atorvastatin (LIPITOR) 40 MG tablet Take 1 tablet (40 mg total) by mouth daily at 6 PM. Patient not taking: Reported on 10/05/2016 09/02/14   Barton Dubois, MD  carvedilol (COREG) 25 MG tablet Take 1 tablet (25 mg total) by mouth 2 (two) times daily with  a meal. Patient not taking: Reported on 10/05/2016 08/08/16   Nita Sells, MD  cinacalcet (SENSIPAR) 30 MG tablet Take 30 mg by mouth every evening.     Historical Provider, MD  clonazePAM (KLONOPIN) 1 MG tablet Take 1 mg by mouth 2 (two) times daily.    Historical Provider, MD  cloNIDine (CATAPRES - DOSED IN MG/24 HR) 0.3 mg/24hr patch Place 1 patch (0.3 mg total) onto the skin every Monday. Patient not taking: Reported on 10/05/2016 08/19/16   Shawnee Knapp, MD  doxazosin (CARDURA) 2 MG tablet Take 1 tablet (2 mg total) by mouth daily. 08/08/16   Nita Sells, MD  famotidine (PEPCID) 20 MG tablet Take 1 tablet (20 mg total) by mouth 2 (two) times daily. Patient not taking: Reported on 10/05/2016 10/04/16   Varney Biles, MD  gabapentin (NEURONTIN) 100 MG capsule Take 1 capsule (100 mg total) by mouth at bedtime. Patient not taking: Reported on 10/05/2016  05/21/16   [name], DO  isosorbide mononitrate (IMDUR) 60 MG 24 hr tablet Take 1 tablet (60 mg total) by mouth daily. Patient not taking: Reported on 10/05/2016 08/08/16   Nita Sells, MD  methocarbamol (ROBAXIN) 500 MG tablet Take 1 tablet (500 mg total) by mouth 2 (two) times daily as needed for muscle spasms. Patient not taking: Reported on 10/05/2016 08/08/16   Nita Sells, MD  omeprazole (PRILOSEC) 20 MG capsule Take 1 capsule (20 mg total) by mouth daily. Patient not taking: Reported on 10/05/2016 08/08/16   Nita Sells, MD  ondansetron (ZOFRAN ODT) 4 MG disintegrating tablet Take 1 tablet (4 mg total) by mouth every 8 (eight) hours as needed for nausea or vomiting. Patient not taking: Reported on 10/05/2016 09/27/16   Ezequiel Essex, MD  senna-docusate (SENOKOT-S) 8.6-50 MG tablet Take 1 tablet by mouth at bedtime as needed for mild constipation. Patient not taking: Reported on 10/05/2016 08/08/16   Nita Sells, MD  sevelamer carbonate (RENVELA) 800 MG tablet Take 1,600 mg by mouth 3 (three) times daily  with meals.     Historical Provider, MD    Family History Family History  Problem Relation Age of Onset  . Hypertension Other     Social History Social History  Substance Use Topics  . Smoking status: Former Smoker    Packs/day: 0.00    Years: 1.00    Types: Cigarettes  . Smokeless tobacco: Never Used     Comment: quit Jan 2014  . Alcohol use No     Allergies   Butalbital-apap-caffeine; Ferrlecit [na ferric gluc cplx in sucrose]; Minoxidil; Tylenol [acetaminophen]; and Darvocet [propoxyphene n-acetaminophen]   Review of Systems Review of Systems 10 systems reviewed and all are negative for acute change except as noted in the HPI.  Physical Exam Updated Vital Signs BP (!) 142/80   Pulse 78   Temp 98.2 F (36.8 C) (Oral)   Resp 16   Ht _0  (1.778 m)   Wt 165 lb (74.8 kg)   SpO2 96%   BMI 23.68 kg/m   Physical Exam  Constitutional: He is oriented to person, place, and time. He appears well-developed and well-nourished.  HENT:  Head: Normocephalic and atraumatic.  Eyes: EOM are normal.  Neck: Normal range of motion.  Cardiovascular: Normal rate, regular rhythm, normal heart sounds and intact distal pulses.   Pulmonary/Chest: Effort normal and breath sounds normal. No respiratory distress.  Abdominal: Soft. He exhibits distension. There is no tenderness. There is no guarding.  Generalized tenderness in the upper quadrants/ No peritoneal signs  Musculoskeletal: Normal range of motion.  Neurological: He is alert and oriented to person, place, and time.  Skin: Skin is warm and dry.  Psychiatric: He has a normal mood and affect. Judgment normal.  Nursing note and vitals reviewed.  ED Treatments / Results  DIAGNOSTIC STUDIES:  Oxygen Saturation is 99% on RA, normal by my interpretation.    COORDINATION OF CARE:  3:49 AM Discussed treatment plan with pt at bedside and pt agreed to plan.  Labs (all labs ordered are listed, but only abnormal results are  displayed) Labs Reviewed  LIPASE, BLOOD - Abnormal; Notable for the following:       Result Value   Lipase 90 (*)    All other components within normal limits  COMPREHENSIVE METABOLIC PANEL - Abnormal; Notable for the following:    Chloride 96 (*)    BUN 33 (*)    Creatinine, Ser 11.11 (*)  Calcium 7.7 (*)    Albumin 3.3 (*)    ALT 9 (*)    Alkaline Phosphatase 170 (*)    GFR calc non Af Amer 5 (*)    GFR calc Af Amer 5 (*)    All other components within normal limits  CBC - Abnormal; Notable for the following:    RBC 3.81 (*)    Hemoglobin 10.2 (*)    HCT 33.1 (*)    RDW 17.9 (*)    All other components within normal limits    EKG  EKG Interpretation None       Radiology US Abdomen Limited Ruq  Result Date: 10/04/2016 CLINICAL DATA:  Abdominal pain for 1 day. History of renal transplant and inguinal hernia repair. History of dialysis, diabetes, hypertension, CHF. EXAM: US ABDOMEN LIMITED - RIGHT UPPER QUADRANT COMPARISON:  CT abdomen and pelvis 09/27/2016 FINDINGS: Gallbladder: No gallstones or wall thickening visualized. No sonographic Murphy sign noted by sonographer. Common bile duct: Diameter: 3.2 mm, normal Liver: No focal lesion identified. Within normal limits in parenchymal echogenicity. Incidental note of right renal atrophy. IMPRESSION: No evidence of cholelithiasis or cholecystitis. Right renal atrophy. Electronically Signed   By: Lucienne Capers M.D.   On: 10/04/2016 05:58    Procedures Procedures (including critical care time)  Medications Ordered in ED Medications  HYDROmorphone (DILAUDID) injection 1 mg (1 mg Intravenous Given 10/04/16 0452)  ondansetron (ZOFRAN) injection 4 mg (4 mg Intravenous Given 10/04/16 0452)  famotidine (PEPCID) IVPB 20 mg premix (0 mg Intravenous Stopped 10/04/16 0819)  sucralfate (CARAFATE) tablet 1 g (1 g Oral Given 10/04/16 0806)  oxyCODONE-acetaminophen (PERCOCET/ROXICET) 5-325 MG per tablet 1 tablet (1 tablet Oral Given  10/04/16 0819)     Initial Impression / Assessment and Plan / ED Course  I have reviewed the triage vital signs and the nursing notes.  Pertinent labs & imaging results that were available during my care of the patient were reviewed by me and considered in my medical decision making (see chart for details).    I personally performed the services described in this documentation, which was scribed in my presence. The recorded information has been reviewed and is accurate.   Pt comes in with cc of abd pain. Pt has hx of ESRD on HD, HL, HTN Pt here for abd pain. He has no peritoneal signs on exam, but pt reports that the pain, mostly epigastric is fairly severe. Pt's lipase is elevated. Pt denies alcohol abuse. [place] ordered. Outside of pancreatitis or hepatobiliary disease - we are also concerned for PUD. CT from last week reviewed.    CT abdomen: IMPRESSION: Mild to moderate fluid-filled distention of small large bowel suspicious for an enterocolitis. No focal mechanical source of obstruction is identified. Adynamic ileus is also possibility.  Atrophic native kidneys with left-sided renal cysts. Transplanted left pelvic kidney with extensive vascular calcification.  Cardiomegaly without acute pulmonary disease.  Final Clinical Impressions(s) / ED Diagnoses   Final diagnoses:  Abdominal pain  Elevated lipase  Generalized abdominal pain  PUD (peptic ulcer disease)    New Prescriptions Discharge Medication List as of 10/04/2016  8:08 AM        Varney Biles, MD 10/06/16 0107

## 2016-10-05 ENCOUNTER — Inpatient Hospital Stay (HOSPITAL_COMMUNITY)
Admission: AD | Admit: 2016-10-05 | Discharge: 2016-10-08 | DRG: 391 | Disposition: A | Discharge status: Home / Self Care | Payer: Medicare Other | Source: Other Acute Inpatient Hospital | Attending: Internal Medicine | Admitting: Internal Medicine

## 2016-10-05 DIAGNOSIS — I120 Hypertensive chronic kidney disease with stage 5 chronic kidney disease or end stage renal disease: Secondary | ICD-10-CM | POA: Diagnosis not present

## 2016-10-05 DIAGNOSIS — K529 Noninfective gastroenteritis and colitis, unspecified: Secondary | ICD-10-CM | POA: Diagnosis not present

## 2016-10-05 DIAGNOSIS — Z992 Dependence on renal dialysis: Secondary | ICD-10-CM

## 2016-10-05 DIAGNOSIS — Z885 Allergy status to narcotic agent status: Secondary | ICD-10-CM | POA: Diagnosis not present

## 2016-10-05 DIAGNOSIS — G4700 Insomnia, unspecified: Secondary | ICD-10-CM | POA: Diagnosis not present

## 2016-10-05 DIAGNOSIS — I10 Essential (primary) hypertension: Secondary | ICD-10-CM | POA: Diagnosis not present

## 2016-10-05 DIAGNOSIS — Z888 Allergy status to other drugs, medicaments and biological substances status: Secondary | ICD-10-CM | POA: Diagnosis not present

## 2016-10-05 DIAGNOSIS — E1122 Type 2 diabetes mellitus with diabetic chronic kidney disease: Secondary | ICD-10-CM | POA: Diagnosis present

## 2016-10-05 DIAGNOSIS — I429 Cardiomyopathy, unspecified: Secondary | ICD-10-CM | POA: Diagnosis present

## 2016-10-05 DIAGNOSIS — R109 Unspecified abdominal pain: Secondary | ICD-10-CM

## 2016-10-05 DIAGNOSIS — F4323 Adjustment disorder with mixed anxiety and depressed mood: Secondary | ICD-10-CM | POA: Diagnosis present

## 2016-10-05 DIAGNOSIS — M545 Low back pain: Secondary | ICD-10-CM | POA: Diagnosis not present

## 2016-10-05 DIAGNOSIS — R112 Nausea with vomiting, unspecified: Secondary | ICD-10-CM

## 2016-10-05 DIAGNOSIS — E8889 Other specified metabolic disorders: Secondary | ICD-10-CM | POA: Diagnosis not present

## 2016-10-05 DIAGNOSIS — Z87891 Personal history of nicotine dependence: Secondary | ICD-10-CM | POA: Diagnosis not present

## 2016-10-05 DIAGNOSIS — E875 Hyperkalemia: Secondary | ICD-10-CM | POA: Diagnosis present

## 2016-10-05 DIAGNOSIS — R1013 Epigastric pain: Secondary | ICD-10-CM | POA: Diagnosis not present

## 2016-10-05 DIAGNOSIS — N281 Cyst of kidney, acquired: Secondary | ICD-10-CM | POA: Diagnosis not present

## 2016-10-05 DIAGNOSIS — G8929 Other chronic pain: Secondary | ICD-10-CM | POA: Diagnosis not present

## 2016-10-05 DIAGNOSIS — D631 Anemia in chronic kidney disease: Secondary | ICD-10-CM | POA: Diagnosis present

## 2016-10-05 DIAGNOSIS — N185 Chronic kidney disease, stage 5: Secondary | ICD-10-CM | POA: Diagnosis not present

## 2016-10-05 DIAGNOSIS — Z86718 Personal history of other venous thrombosis and embolism: Secondary | ICD-10-CM | POA: Diagnosis not present

## 2016-10-05 DIAGNOSIS — N186 End stage renal disease: Secondary | ICD-10-CM | POA: Diagnosis not present

## 2016-10-05 DIAGNOSIS — E119 Type 2 diabetes mellitus without complications: Secondary | ICD-10-CM | POA: Diagnosis not present

## 2016-10-05 DIAGNOSIS — N2889 Other specified disorders of kidney and ureter: Secondary | ICD-10-CM | POA: Diagnosis present

## 2016-10-05 DIAGNOSIS — I132 Hypertensive heart and chronic kidney disease with heart failure and with stage 5 chronic kidney disease, or end stage renal disease: Secondary | ICD-10-CM | POA: Diagnosis not present

## 2016-10-05 DIAGNOSIS — N2581 Secondary hyperparathyroidism of renal origin: Secondary | ICD-10-CM | POA: Diagnosis not present

## 2016-10-05 DIAGNOSIS — R1110 Vomiting, unspecified: Secondary | ICD-10-CM | POA: Diagnosis not present

## 2016-10-05 DIAGNOSIS — Z940 Kidney transplant status: Secondary | ICD-10-CM | POA: Diagnosis not present

## 2016-10-05 DIAGNOSIS — N189 Chronic kidney disease, unspecified: Secondary | ICD-10-CM

## 2016-10-05 DIAGNOSIS — I5042 Chronic combined systolic (congestive) and diastolic (congestive) heart failure: Secondary | ICD-10-CM | POA: Diagnosis not present

## 2016-10-05 DIAGNOSIS — Z79899 Other long term (current) drug therapy: Secondary | ICD-10-CM | POA: Diagnosis not present

## 2016-10-05 DIAGNOSIS — N261 Atrophy of kidney (terminal): Secondary | ICD-10-CM | POA: Diagnosis not present

## 2016-10-05 DIAGNOSIS — M549 Dorsalgia, unspecified: Secondary | ICD-10-CM | POA: Diagnosis not present

## 2016-10-05 LAB — CBC
HCT: 35.1 % — ABNORMAL LOW (ref 39.0–52.0)
Hemoglobin: 11 g/dL — ABNORMAL LOW (ref 13.0–17.0)
MCH: 27 pg (ref 26.0–34.0)
MCHC: 31.3 g/dL (ref 30.0–36.0)
MCV: 86.2 fL (ref 78.0–100.0)
PLATELETS: 186 10*3/uL (ref 150–400)
RBC: 4.07 MIL/uL — AB (ref 4.22–5.81)
RDW: 18 % — ABNORMAL HIGH (ref 11.5–15.5)
WBC: 7.5 10*3/uL (ref 4.0–10.5)

## 2016-10-05 LAB — BASIC METABOLIC PANEL
Anion gap: 18 — ABNORMAL HIGH (ref 5–15)
BUN: 53 mg/dL — ABNORMAL HIGH (ref 6–20)
CO2: 24 mmol/L (ref 22–32)
CREATININE: 14.13 mg/dL — AB (ref 0.61–1.24)
Calcium: 7.7 mg/dL — ABNORMAL LOW (ref 8.9–10.3)
Chloride: 92 mmol/L — ABNORMAL LOW (ref 101–111)
GFR, EST AFRICAN AMERICAN: 4 mL/min — AB (ref >60)
GFR, EST NON AFRICAN AMERICAN: 3 mL/min — AB (ref >60)
Glucose, Bld: 88 mg/dL (ref 65–99)
Potassium: 5 mmol/L (ref 3.5–5.1)
Sodium: 134 mmol/L — ABNORMAL LOW (ref 135–145)

## 2016-10-05 LAB — PROTIME-INR
INR: 1.14
PROTHROMBIN TIME: 14.7 s (ref 11.4–15.2)

## 2016-10-05 LAB — TSH: TSH: 1.784 u[IU]/mL (ref 0.350–4.500)

## 2016-10-05 MED ORDER — FAMOTIDINE 20 MG PO TABS
20.0000 mg | ORAL_TABLET | Freq: Two times a day (BID) | ORAL | Status: DC
  Administered 2016-10-06: 20 mg via ORAL
  Filled 2016-10-05: qty 1

## 2016-10-05 MED ORDER — ALLOPURINOL 100 MG PO TABS
100.0000 mg | ORAL_TABLET | Freq: Every day | ORAL | Status: DC
  Administered 2016-10-05 – 2016-10-08 (×3): 100 mg via ORAL
  Filled 2016-10-05 (×4): qty 1

## 2016-10-05 MED ORDER — CINACALCET HCL 30 MG PO TABS
30.0000 mg | ORAL_TABLET | Freq: Every evening | ORAL | Status: DC
  Administered 2016-10-05 – 2016-10-07 (×3): 30 mg via ORAL
  Filled 2016-10-05 (×3): qty 1

## 2016-10-05 MED ORDER — AMLODIPINE BESYLATE 5 MG PO TABS
5.0000 mg | ORAL_TABLET | Freq: Every day | ORAL | Status: DC
  Administered 2016-10-06 – 2016-10-07 (×3): 5 mg via ORAL
  Filled 2016-10-05 (×3): qty 1

## 2016-10-05 MED ORDER — ONDANSETRON HCL 4 MG/2ML IJ SOLN
4.0000 mg | Freq: Four times a day (QID) | INTRAMUSCULAR | Status: DC | PRN
  Administered 2016-10-05: 4 mg via INTRAVENOUS
  Filled 2016-10-05: qty 2

## 2016-10-05 MED ORDER — ATORVASTATIN CALCIUM 40 MG PO TABS
40.0000 mg | ORAL_TABLET | Freq: Every day | ORAL | Status: DC
  Administered 2016-10-05 – 2016-10-07 (×3): 40 mg via ORAL
  Filled 2016-10-05 (×3): qty 1

## 2016-10-05 MED ORDER — SODIUM CHLORIDE 0.9 % IV SOLN
INTRAVENOUS | Status: DC
  Administered 2016-10-05: 18:00:00 via INTRAVENOUS

## 2016-10-05 MED ORDER — CLONIDINE HCL 0.3 MG/24HR TD PTWK
0.3000 mg | MEDICATED_PATCH | TRANSDERMAL | Status: DC
  Administered 2016-10-07: 0.3 mg via TRANSDERMAL
  Filled 2016-10-05: qty 1

## 2016-10-05 MED ORDER — ZOLPIDEM TARTRATE 5 MG PO TABS
5.0000 mg | ORAL_TABLET | Freq: Once | ORAL | Status: AC
  Administered 2016-10-05: 5 mg via ORAL
  Filled 2016-10-05: qty 1

## 2016-10-05 MED ORDER — SEVELAMER CARBONATE 800 MG PO TABS
1600.0000 mg | ORAL_TABLET | Freq: Three times a day (TID) | ORAL | Status: DC
  Administered 2016-10-06 – 2016-10-08 (×5): 1600 mg via ORAL
  Filled 2016-10-05 (×6): qty 2

## 2016-10-05 MED ORDER — CLONAZEPAM 1 MG PO TABS
1.0000 mg | ORAL_TABLET | Freq: Two times a day (BID) | ORAL | Status: DC
  Administered 2016-10-06 – 2016-10-08 (×6): 1 mg via ORAL
  Filled 2016-10-05 (×6): qty 1

## 2016-10-05 MED ORDER — PANTOPRAZOLE SODIUM 40 MG PO TBEC
40.0000 mg | DELAYED_RELEASE_TABLET | Freq: Every day | ORAL | Status: DC
  Administered 2016-10-05 – 2016-10-08 (×4): 40 mg via ORAL
  Filled 2016-10-05 (×5): qty 1

## 2016-10-05 MED ORDER — METHOCARBAMOL 500 MG PO TABS
500.0000 mg | ORAL_TABLET | Freq: Two times a day (BID) | ORAL | Status: DC | PRN
  Administered 2016-10-05 – 2016-10-06 (×2): 500 mg via ORAL
  Filled 2016-10-05 (×2): qty 1

## 2016-10-05 MED ORDER — GABAPENTIN 100 MG PO CAPS
100.0000 mg | ORAL_CAPSULE | Freq: Every day | ORAL | Status: DC
  Administered 2016-10-06 – 2016-10-07 (×3): 100 mg via ORAL
  Filled 2016-10-05 (×3): qty 1

## 2016-10-05 MED ORDER — HYDROCODONE-ACETAMINOPHEN 5-325 MG PO TABS: 1.0000 | ORAL_TABLET | Freq: Four times a day (QID) | ORAL | Status: DC | PRN

## 2016-10-05 MED ORDER — ISOSORBIDE MONONITRATE ER 60 MG PO TB24
60.0000 mg | ORAL_TABLET | Freq: Every day | ORAL | Status: DC
  Administered 2016-10-05 – 2016-10-08 (×4): 60 mg via ORAL
  Filled 2016-10-05 (×5): qty 1

## 2016-10-05 MED ORDER — OXYCODONE-ACETAMINOPHEN 5-325 MG PO TABS
1.0000 | ORAL_TABLET | Freq: Four times a day (QID) | ORAL | Status: DC | PRN
  Administered 2016-10-05 – 2016-10-07 (×5): 2 via ORAL
  Filled 2016-10-05 (×4): qty 2

## 2016-10-05 MED ORDER — HEPARIN SODIUM (PORCINE) 5000 UNIT/ML IJ SOLN: 5000.0000 [IU] | Freq: Three times a day (TID) | INTRAMUSCULAR | Status: DC

## 2016-10-05 MED ORDER — DOXAZOSIN MESYLATE 2 MG PO TABS
2.0000 mg | ORAL_TABLET | Freq: Every day | ORAL | Status: DC
  Administered 2016-10-05 – 2016-10-08 (×4): 2 mg via ORAL
  Filled 2016-10-05 (×5): qty 1

## 2016-10-05 MED ORDER — ONDANSETRON HCL 4 MG PO TABS: 4.0000 mg | ORAL_TABLET | Freq: Four times a day (QID) | ORAL | Status: DC | PRN

## 2016-10-05 MED ORDER — SODIUM CHLORIDE 0.9% FLUSH
3.0000 mL | Freq: Two times a day (BID) | INTRAVENOUS | Status: DC
  Administered 2016-10-05 – 2016-10-08 (×5): 3 mL via INTRAVENOUS

## 2016-10-05 MED ORDER — CARVEDILOL 25 MG PO TABS
25.0000 mg | ORAL_TABLET | Freq: Two times a day (BID) | ORAL | Status: DC
  Administered 2016-10-05 – 2016-10-08 (×6): 25 mg via ORAL
  Filled 2016-10-05 (×6): qty 1

## 2016-10-05 MED ORDER — SENNOSIDES-DOCUSATE SODIUM 8.6-50 MG PO TABS: 1.0000 | ORAL_TABLET | Freq: Every evening | ORAL | Status: DC | PRN

## 2016-10-05 NOTE — Consult Note (Addendum)
Reason for Consult: Continuity of ESRD care, hyperkalemia Referring Physician: Verlee Monte M.D. [facility])  HPI:  51 year old African-American man with past medical history significant for hypertension, combined systolic/diastolic heart failure, nonischemic cardiomyopathy, history of depression, history of DVT/PE and end-stage renal disease on hemodialysis on a Monday/Wednesday/Friday schedule at [facility] [place].  Past history punctuated by several visits to the emergency room for multiple complaints over the past 6 months and recently was seen in the emergency room and evaluated for abdominal pain with negative workup however, presented again to the [facility] emergency room earlier today with worsening abdominal pain. Radiological workup was negative for the etiology of this pain however, he was deemed to be having shortness of breath (no documented hypoxemia) and was found to have an elevated potassium of 6.2 having missed his dialysis yesterday. He states that he went to his dialysis on Wednesday and left at his dry weight complete his entire treatment.  He reports abdominal pain for the past week or so associated with nausea and vomiting but no diarrhea. Denies any hematochezia, melena or hematemesis. Denies any fever or chills. Denies any dysuria, urgency, frequency or flank pain.  He states that his dry weight is about 165 pounds.  Past Medical History:  Diagnosis Date  . Anemia   . Anxiety   . Chronic combined systolic and diastolic CHF (congestive heart failure) (HCC)    a. EF 20-25% by echo in 08/2015 b. echo 10/2015: EF 35-40%, diffuse HK, severe LAE, moderate RAE, small pericardial effusion  . Complication of anesthesia    itching, sore throat  . Depression   . Dialysis patient ([place])   . ESRD (end stage renal disease) ([place])    due to HTN per patient, followed at [facility], s/p failed kidney transplant - dialysis Tue, Th, Sat  . Hyperkalemia 12/2015  . Hypertension   . Junctional  rhythm    a. noted in 08/2015: hyperkalemic at that time  b. 12/2015: presented in junctional rhythm w/ K+ of 6.6. Resolved with improvement of K+ levels.  . Nonischemic cardiomyopathy ([place])    a. 08/2014: cath showing minimal CAD, but tortuous arteries noted.   . Personal history of DVT (deep vein thrombosis)/ PE 05/26/2016   In Oct 2015 had small subsemental LUL PE w/o DVT (LE dopplers neg) and was felt to be HD cath related, treated w coumadin.  IN May 2016 had small vein DVT (acute/subacute) in the R basilic/ brachial veins of the RUE, resumed on coumadin.  Had R sided HD cath at that time.    . Renal insufficiency   . Shortness of breath   . Type II diabetes mellitus (HCC)    No history per patient, but remains under history as A1c would not be accurate given on dialysis    Past Surgical History:  Procedure Laterality Date  . CAPD INSERTION    . CAPD REMOVAL    . INGUINAL HERNIA REPAIR Right 02/14/2015   Procedure: REPAIR INCARCERATED RIGHT INGUINAL HERNIA;  Surgeon: [name], MD;  Location: [place];  Service: General;  Laterality: Right;  . INSERTION OF DIALYSIS CATHETER Right 09/23/2015   Procedure: exchange of Right internal Dialysis Catheter.;  Surgeon: [name], MD;  Location: [place];  Service: Vascular;  Laterality: Right;  . IR GENERIC HISTORICAL  07/16/2016   IR US GUIDE VASC ACCESS LEFT 07/16/2016 Corrie Mckusick, DO [facility]-INTER[facility]  . IR GENERIC HISTORICAL Left 07/16/2016   IR THROMBECTOMY AV FISTULA W/THROMBOLYSIS/PTA INC/SHUNT/IMG LEFT 07/16/2016 Corrie Mckusick,  DO [facility]-INTER[facility]  . KIDNEY RECEIPIENT  2006   failed and started HD in March 2014  . LEFT HEART CATHETERIZATION WITH CORONARY ANGIOGRAM N/A 09/02/2014   Procedure: LEFT HEART CATHETERIZATION WITH CORONARY ANGIOGRAM;  Surgeon: [name], MD;  Location: [facility] CATH LAB;  Service: Cardiovascular;  Laterality: N/A;    Family History  Problem Relation Age of Onset  . Hypertension Other     Social History:  reports  that he has quit smoking. His smoking use included Cigarettes. He smoked 0.00 packs per day for 1.00 year. He has never used smokeless tobacco. He reports that he does not drink alcohol or use drugs.  Allergies:  Allergies  Allergen Reactions  . Butalbital-Apap-Caffeine Shortness Of Breath, Swelling and Other (See Comments)    Swelling in throat  . Ferrlecit [Na Ferric Gluc Cplx In Sucrose] Shortness Of Breath, Swelling and Other (See Comments)    Swelling in throat  . Minoxidil Shortness Of Breath  . Tylenol [Acetaminophen] Anaphylaxis and Swelling  . Darvocet [Propoxyphene N-Acetaminophen] Hives    Medications:  Scheduled: . allopurinol  100 mg Oral Daily  . amLODipine  5 mg Oral QHS  . atorvastatin  40 mg Oral q1800  . carvedilol  25 mg Oral BID WC  . cinacalcet  30 mg Oral QPM  . clonazePAM  1 mg Oral BID  . [START ON 10/07/2016] cloNIDine  0.3 mg Transdermal Q Mon  . doxazosin  2 mg Oral Daily  . famotidine  20 mg Oral BID  . gabapentin  100 mg Oral QHS  . heparin  5,000 Units Subcutaneous Q8H  . isosorbide mononitrate  60 mg Oral Daily  . pantoprazole  40 mg Oral Daily  . [START ON 10/06/2016] sevelamer carbonate  1,600 mg Oral TID WC  . sodium chloride flush  3 mL Intravenous Q12H    BMP Latest Ref Rng & Units 10/03/2016 09/26/2016 09/18/2016  Glucose 65 - 99 mg/dL 93 88 157(H)  BUN 6 - 20 mg/dL 33(H) 32(H) 18  Creatinine 0.61 - 1.24 mg/dL 11.11(H) 10.03(H) 7.77(H)  Sodium 135 - 145 mmol/L 139 139 137  Potassium 3.5 - 5.1 mmol/L 4.8 4.2 4.2  Chloride 101 - 111 mmol/L 96(L) 97(L) 94(L)  CO2 22 - 32 mmol/L _0 Calcium 8.9 - 10.3 mg/dL 7.7(L) 8.6(L) 8.4(L)   CBC Latest Ref Rng & Units 10/03/2016 09/26/2016 09/18/2016  WBC 4.0 - 10.5 K/uL 5.0 6.9 5.0  Hemoglobin 13.0 - 17.0 g/dL 10.2(L) 12.4(L) 10.4(L)  Hematocrit 39.0 - 52.0 % 33.1(L) 38.8(L) 33.3(L)  Platelets 150 - 400 K/uL 178 215 191     US Abdomen Limited Ruq  Result Date: 10/04/2016 CLINICAL DATA:  Abdominal  pain for 1 day. History of renal transplant and inguinal hernia repair. History of dialysis, diabetes, hypertension, CHF. EXAM: US ABDOMEN LIMITED - RIGHT UPPER QUADRANT COMPARISON:  CT abdomen and pelvis 09/27/2016 FINDINGS: Gallbladder: No gallstones or wall thickening visualized. No sonographic Murphy sign noted by sonographer. Common bile duct: Diameter: 3.2 mm, normal Liver: No focal lesion identified. Within normal limits in parenchymal echogenicity. Incidental note of right renal atrophy. IMPRESSION: No evidence of cholelithiasis or cholecystitis. Right renal atrophy. Electronically Signed   By: Lucienne Capers M.D.   On: 10/04/2016 05:58    Review of Systems  Constitutional: Positive for malaise/fatigue. Negative for chills and fever.  HENT: Negative.   Eyes: Negative.   Respiratory: Positive for shortness of breath. Negative for cough and hemoptysis.  Cardiovascular: Negative.   Gastrointestinal: Positive for abdominal pain, nausea and vomiting. Negative for blood in stool and diarrhea.  Genitourinary: Negative.   Musculoskeletal: Negative.   Skin: Negative.   Neurological: Positive for weakness. Negative for dizziness and sensory change.   Blood pressure (!) 174/94, pulse 86, temperature 98.2 F (36.8 C), temperature source Oral, resp. rate 17, SpO2 98 %. Physical Exam  Nursing note and vitals reviewed. Constitutional: He is oriented to person, place, and time. He appears well-developed and well-nourished.  HENT:  Head: Normocephalic and atraumatic.  Nose: Nose normal.  Mouth/Throat: Oropharynx is clear and moist.  Eyes: EOM are normal. Pupils are equal, round, and reactive to light. No scleral icterus.  Neck: Normal range of motion. Neck supple. No JVD present.  Cardiovascular: Normal rate, regular rhythm and normal heart sounds.   Respiratory: Effort normal and breath sounds normal. He has no wheezes. He has no rales.  GI: Soft. Bowel sounds are normal. He exhibits no  distension. There is tenderness.  Musculoskeletal: He exhibits edema.  Neurological: He is alert and oriented to person, place, and time. No cranial nerve deficit. Coordination normal.  Skin: Skin is warm and dry. There is erythema.    Assessment/Plan: 1. Abdominal pain-acute on chronic: No acute intra-abdominal pathology noted on CT scan of the abdomen performed last week or abdominal ultrasound-right upper quadrant performed yesterday. His lipase level was noted to be elevated at 90 and he denies any preceding alcohol use. Currently admitted for ongoing evaluation of other etiologies abdominal pain as well as pain management. 2. End-stage renal disease: We'll order for hemodialysis today to make up for his last treatment yesterday as well as management of his hyperkalemia. It appears that he is close to dry weight and will set ultrafiltration for 2 L given his admission of poor oral intake. 3. Hyperkalemia: Secondary to missed dialysis and likely dietary indiscretions. Hemodialysis today. 4. Hypertension: Unclear regarding his compliance with oral and hypertensive therapy, attempt ultrafiltration with hemodialysis today to challenge dry weight. 5. Anemia of chronic kidney disease: Hemoglobin 2 days ago was 10.2, resume Aranesp with hemodialysis next week. 6. Metabolic bone disease: Will be placed on clear liquid diet, resume binders 3 times a day use. Dialysis unit closed at the time that the patient was seen and unable to reconcile medications regarding vitamin D receptor analogue.   Abrian Hanover K. 10/05/2016, 5:43 PM

## 2016-10-05 NOTE — Progress Notes (Signed)
Patient wants something for sleep tonight.  [facility] called.  Received order for Ambien.  Will continue to monitor patient.  [employer] RN-BC, WTA.

## 2016-10-05 NOTE — H&P (Signed)
History and Physical    Frank [name] DOB: 06/30/1966 DOA: 10/05/2016  PCP: [name] Patient  Patient coming from: Home  Chief Complaint: Abdominal pain and hyperkalemia  HPI: Frank [name] is a 51 y.o. male with medical history significant of ESRD, NICM with diastolic and systolic CHF, HTN, multiple admission to the hospital with acute on chronic abdominal pain. Patient sent from [facility] ED with acute on chronic abdominal pain and hyperkalemia. Patient was in [facility] ED the day PTA, with same complaints of nausea, vomiting and abdominal pain, patient tracts can spend the whole day in the ED and he he missed dialysis and he was discharged from the ED home. He came in earlier today to [facility] ED with the same complaints. Per ED reports, potassium was 6.2, patient have slightly peaked T waves, given Late.  ED Course:  Vitals: WNL Labs: Potassium of 6.2 Imaging: CT scan showed no acute findings. Interventions: kayexalate.  Review of Systems:  Constitutional: negative for anorexia, fevers and sweats Eyes: negative for irritation, redness and visual disturbance Ears, nose, mouth, throat, and face: negative for earaches, epistaxis, nasal congestion and sore throat Respiratory: negative for cough, dyspnea on exertion, sputum and wheezing Cardiovascular: negative for chest pain, dyspnea, lower extremity edema, orthopnea, palpitations and syncope Gastrointestinal: negative for abdominal pain, constipation, diarrhea, melena, nausea and vomiting Genitourinary:negative for dysuria, frequency and hematuria Hematologic/lymphatic: negative for bleeding, easy bruising and lymphadenopathy Musculoskeletal:negative for arthralgias, muscle weakness and stiff joints Neurological: negative for coordination problems, gait problems, headaches and weakness Endocrine: negative for diabetic symptoms including polydipsia, polyuria and weight loss Allergic/Immunologic: negative for  anaphylaxis, hay fever and urticaria  Past Medical History:  Diagnosis Date  . Anemia   . Anxiety   . Chronic combined systolic and diastolic CHF (congestive heart failure) (HCC)    a. EF 20-25% by echo in 08/2015 b. echo 10/2015: EF 35-40%, diffuse HK, severe LAE, moderate RAE, small pericardial effusion  . Complication of anesthesia    itching, sore throat  . Depression   . Dialysis patient ([place])   . ESRD (end stage renal disease) ([place])    due to HTN per patient, followed at [facility], s/p failed kidney transplant - dialysis Tue, Th, Sat  . Hyperkalemia 12/2015  . Hypertension   . Junctional rhythm    a. noted in 08/2015: hyperkalemic at that time  b. 12/2015: presented in junctional rhythm w/ K+ of 6.6. Resolved with improvement of K+ levels.  . Nonischemic cardiomyopathy ([place])    a. 08/2014: cath showing minimal CAD, but tortuous arteries noted.   . Personal history of DVT (deep vein thrombosis)/ PE 05/26/2016   In Oct 2015 had small subsemental LUL PE w/o DVT (LE dopplers neg) and was felt to be HD cath related, treated w coumadin.  IN May 2016 had small vein DVT (acute/subacute) in the R basilic/ brachial veins of the RUE, resumed on coumadin.  Had R sided HD cath at that time.    . Renal insufficiency   . Shortness of breath   . Type II diabetes mellitus (HCC)    No history per patient, but remains under history as A1c would not be accurate given on dialysis    Past Surgical History:  Procedure Laterality Date  . CAPD INSERTION    . CAPD REMOVAL    . INGUINAL HERNIA REPAIR Right 02/14/2015   Procedure: REPAIR INCARCERATED RIGHT INGUINAL HERNIA;  Surgeon: [name], MD;  Location: [place];  Service:  General;  Laterality: Right;  . INSERTION OF DIALYSIS CATHETER Right 09/23/2015   Procedure: exchange of Right internal Dialysis Catheter.;  Surgeon: [name], MD;  Location: [place];  Service: Vascular;  Laterality: Right;  . IR GENERIC HISTORICAL  07/16/2016   IR US GUIDE VASC  ACCESS LEFT 07/16/2016 Corrie Mckusick, DO [facility]-INTERV RAD  . IR GENERIC HISTORICAL Left 07/16/2016   IR THROMBECTOMY AV FISTULA W/THROMBOLYSIS/PTA INC/SHUNT/IMG LEFT 07/16/2016 Corrie Mckusick, DO [facility]-INTER[facility]  . KIDNEY RECEIPIENT  2006   failed and started HD in March 2014  . LEFT HEART CATHETERIZATION WITH CORONARY ANGIOGRAM N/A 09/02/2014   Procedure: LEFT HEART CATHETERIZATION WITH CORONARY ANGIOGRAM;  Surgeon: [name], MD;  Location: [place] CATH LAB;  Service: Cardiovascular;  Laterality: N/A;     reports that he has quit smoking. His smoking use included Cigarettes. He smoked 0.00 packs per day for 1.00 year. He has never used smokeless tobacco. He reports that he does not drink alcohol or use drugs.  Allergies  Allergen Reactions  . Butalbital-Apap-Caffeine Shortness Of Breath, Swelling and Other (See Comments)    Swelling in throat  . Ferrlecit [Na Ferric Gluc Cplx In Sucrose] Shortness Of Breath, Swelling and Other (See Comments)    Swelling in throat  . Minoxidil Shortness Of Breath  . Tylenol [Acetaminophen] Anaphylaxis and Swelling  . Darvocet [Propoxyphene N-Acetaminophen] Hives    Family History  Problem Relation Age of Onset  . Hypertension Other     Prior to Admission medications   Medication Sig Start Date End Date Taking? Authorizing Provider  acetaminophen-codeine (TYLENOL #3) 300-30 MG tablet Take 1 tablet by mouth every 6 (six) hours as needed. 08/09/16   Tresa Garter, MD  allopurinol (ZYLOPRIM) 100 MG tablet Take 100 mg by mouth daily.    Historical Provider, MD  ALPRAZolam [name]) 1 MG tablet Take 1 tablet (1 mg total) by mouth at bedtime as needed for sleep. 08/13/16   Okey Regal, PA-C  amLODipine (NORVASC) 5 MG tablet Take 1 tablet (5 mg total) by mouth at bedtime. 08/08/16   Nita Sells, MD  atorvastatin (LIPITOR) 40 MG tablet Take 1 tablet (40 mg total) by mouth daily at 6 PM. 09/02/14   Barton Dubois, MD  carvedilol (COREG) 25 MG tablet Take 1  tablet (25 mg total) by mouth 2 (two) times daily with a meal. 08/08/16   Nita Sells, MD  cinacalcet (SENSIPAR) 30 MG tablet Take 30 mg by mouth every evening.     Historical Provider, MD  clonazePAM (KLONOPIN) 1 MG tablet Take 1 mg by mouth 2 (two) times daily.    Historical Provider, MD  cloNIDine (CATAPRES - DOSED IN MG/24 HR) 0.3 mg/24hr patch Place 1 patch (0.3 mg total) onto the skin every Monday. 08/19/16   Shawnee Knapp, MD  doxazosin (CARDURA) 2 MG tablet Take 1 tablet (2 mg total) by mouth daily. 08/08/16   Nita Sells, MD  famotidine (PEPCID) 20 MG tablet Take 1 tablet (20 mg total) by mouth 2 (two) times daily. 10/04/16   Varney Biles, MD  gabapentin (NEURONTIN) 100 MG capsule Take 1 capsule (100 mg total) by mouth at bedtime. Patient taking differently: Take 100 mg by mouth 3 (three) times daily.  05/21/16   [name], DO  HYDROcodone-acetaminophen (NORCO/VICODIN) 5-325 MG tablet Take 1 tablet by mouth every 6 (six) hours as needed for moderate pain.    Historical Provider, MD  isosorbide mononitrate (IMDUR) 60 MG 24 hr tablet  Take 1 tablet (60 mg total) by mouth daily. 08/08/16   Nita Sells, MD  methocarbamol (ROBAXIN) 500 MG tablet Take 1 tablet (500 mg total) by mouth 2 (two) times daily as needed for muscle spasms. 08/08/16   Nita Sells, MD  omeprazole (PRILOSEC) 20 MG capsule Take 1 capsule (20 mg total) by mouth daily. 08/08/16   Nita Sells, MD  ondansetron (ZOFRAN ODT) 4 MG disintegrating tablet Take 1 tablet (4 mg total) by mouth every 8 (eight) hours as needed for nausea or vomiting. 09/27/16   Ezequiel Essex, MD  oxycodone (OXY-IR) 5 MG capsule Take 1 capsule (5 mg total) by mouth every 4 (four) hours as needed for pain. 05/30/16   [name], MD  oxyCODONE-acetaminophen (PERCOCET/ROXICET) 5-325 MG tablet Take 1 tablet by mouth every 6 (six) hours as needed for severe pain. 10/04/16   Varney Biles, MD  senna-docusate (SENOKOT-S) 8.6-50  MG tablet Take 1 tablet by mouth at bedtime as needed for mild constipation. 08/08/16   Nita Sells, MD  sevelamer carbonate (RENVELA) 800 MG tablet Take 1,600 mg by mouth 3 (three) times daily with meals.     Historical Provider, MD    Physical Exam:  Vitals:   10/05/16 1700  BP: (!) 174/94  Pulse: 86  Resp: 17  Temp: 98.2 F (36.8 C)  TempSrc: Oral  SpO2: 98%    Constitutional: NAD, calm, comfortable Eyes: PERRL, lids and conjunctivae normal ENMT: Mucous membranes are moist. Posterior pharynx clear of any exudate or lesions.Normal dentition.  Neck: normal, supple, no masses, no thyromegaly Respiratory: clear to auscultation bilaterally, no wheezing, no crackles. Normal respiratory effort. No accessory muscle use.  Cardiovascular: Regular rate and rhythm, no murmurs / rubs / gallops. No extremity edema. 2+ pedal pulses. No carotid bruits.  Abdomen: no tenderness, no masses palpated. No hepatosplenomegaly. Bowel sounds positive.  Musculoskeletal: no clubbing / cyanosis. No joint deformity upper and lower extremities. Good ROM, no contractures. Normal muscle tone.  Skin: no rashes, lesions, ulcers. No induration Neurologic: CN 2-12 grossly intact. Sensation intact, DTR normal. Strength 5/5 in all 4.  Psychiatric: Normal judgment and insight. Alert and oriented x 3. Normal mood.   Labs on Admission: I have personally reviewed following labs and imaging studies  CBC:  Recent Labs Lab 10/03/16 2229  WBC 5.0  HGB 10.2*  HCT 33.1*  MCV 86.9  PLT 242   Basic Metabolic Panel:  Recent Labs Lab 10/03/16 2229  NA 139  K 4.8  CL 96*  CO2 28  GLUCOSE 93  BUN 33*  CREATININE 11.11*  CALCIUM 7.7*   GFR: Estimated Creatinine Clearance: 8 mL/min (A) (by C-G formula based on SCr of 11.11 mg/dL (H)). Liver Function Tests:  Recent Labs Lab 10/03/16 2229  AST 22  ALT 9*  ALKPHOS 170*  BILITOT 0.5  PROT 7.2  ALBUMIN 3.3*    Recent Labs Lab 10/03/16 2229    LIPASE 90*   No results for input(s): AMMONIA in the last 168 hours. Coagulation Profile: No results for input(s): INR, PROTIME in the last 168 hours. Cardiac Enzymes: No results for input(s): CKTOTAL, CKMB, CKMBINDEX, TROPONINI in the last 168 hours. BNP (last 3 results) No results for input(s): PROBNP in the last 8760 hours. HbA1C: No results for input(s): HGBA1C in the last 72 hours. CBG: No results for input(s): GLUCAP in the last 168 hours. Lipid Profile: No results for input(s): CHOL, HDL, LDLCALC, TRIG, CHOLHDL, LDLDIRECT in the last 72 hours. Thyroid Function  Tests: No results for input(s): TSH, T4TOTAL, FREET4, T3FREE, THYROIDAB in the last 72 hours. Anemia Panel: No results for input(s): VITAMINB12, FOLATE, FERRITIN, TIBC, IRON, RETICCTPCT in the last 72 hours. Urine analysis:    Component Value Date/Time   COLORURINE YELLOW 10/18/2013 0419   APPEARANCEUR CLEAR 10/18/2013 0419   LABSPEC 1.008 10/18/2013 0419   PHURINE 8.5 (H) 10/18/2013 0419   GLUCOSEU 100 (A) 10/18/2013 0419   HGBUR TRACE (A) 10/18/2013 0419   BILIRUBINUR NEGATIVE 10/18/2013 0419   KETONESUR NEGATIVE 10/18/2013 0419   PROTEINUR 100 (A) 10/18/2013 0419   UROBILINOGEN 0.2 10/18/2013 0419   NITRITE NEGATIVE 10/18/2013 0419   LEUKOCYTESUR NEGATIVE 10/18/2013 0419   Sepsis Labs: !!!!!!!!!!!!!!!!!!!!!!!!!!!!!!!!!!!!!!!!!!!! Invalid input(s): PROCALCITONIN, LACTICIDVEN No results found for this or any previous visit (from the past 240 hour(s)).   Radiological Exams on Admission: US Abdomen Limited Ruq  Result Date: 10/04/2016 CLINICAL DATA:  Abdominal pain for 1 day. History of renal transplant and inguinal hernia repair. History of dialysis, diabetes, hypertension, CHF. EXAM: US ABDOMEN LIMITED - RIGHT UPPER QUADRANT COMPARISON:  CT abdomen and pelvis 09/27/2016 FINDINGS: Gallbladder: No gallstones or wall thickening visualized. No sonographic Murphy sign noted by sonographer. Common bile duct:  Diameter: 3.2 mm, normal Liver: No focal lesion identified. Within normal limits in parenchymal echogenicity. Incidental note of right renal atrophy. IMPRESSION: No evidence of cholelithiasis or cholecystitis. Right renal atrophy. Electronically Signed   By: Lucienne Capers M.D.   On: 10/04/2016 05:58    EKG: Independently reviewed.   Assessment/Plan Active Problems:   Anemia of chronic kidney failure   Adjustment disorder with mixed anxiety and depressed mood   Hyperkalemia   Chronic back pain   End-stage renal disease on hemodialysis (HCC)   Abdominal pain    Acute on chronic abdominal pain -Presented with nausea, vomiting and diffuse but mostly epigastric abdominal pain. -His pain is acute on chronic, been evaluated extensively before. No hematemesis or hematochezia. -CT scan earlier today at [facility] ED showed no acute findings. -Treated symptomatically with antiemetics and pain medications, clear liquids. -Denies any drug use, I will check serum drug screen. -Advance diet as tolerated.  Hyperkalemia -A slight hyperkalemia with potassium of 6.2, according to the report has slight peaked T waves. -Stat BMP and repeat EKG. Not sure potassium is high enough to cause peaked T waves.  End-stage renal disease -He usually dialyzes at [place], [place]. Monday/Wednesday and Friday. -Missed dialysis prior to admission because he was in the ED. -Nephrology consulted, appreciate Dr. Serita Grit help. Does not appear to be floridly overloaded.  Chronic back pain -Patient is on multiple pain medications for his back pain, continued Neurontin and Percocet.  Hypertension -Restarted home medications.  Chronic combined systolic and diastolic CHF -Secondary to nonischemic cardiomyopathy, currently on room air. -LVEF was 23% in February 2017, improved to 35-40% in April 2017. -I'll defer doing a chest x-ray as he is not short of breath or hypoxic. -Started home medications including Coreg,  consider ACEI or ARB.   DVT prophylaxis: SQ Heparin Code Status: Full Code Family Communication: Plan D/W patient Disposition Plan: Home Consults called:  Admission status: Observation   Japhet Morgenthaler A MD [facility] Pager [phone]  If 7PM-7AM, please contact night-coverage [email or web address] Password [facility]  10/05/2016, 5:36 PM

## 2016-10-05 NOTE — Progress Notes (Signed)
New Admission Note: transferred from [facility] ED  Arrival Method: ambulance stretcher Mental Orientation: a/ o x4 Telemetry: none Assessment: Completed Skin: patient refused skin assessment IV:RA SL Pain:Abd 8/10 Tubes:none Safety Measures: Safety Fall Prevention Plan has been given, discussed and signed Admission: Completed Unit Orientation: Patient has been orientated to the room, unit and staff.  Family:none present  Orders have been reviewed and implemented. Will continue to monitor the patient. Call light has been placed within reach and bed alarm has been activated.   Retta Mac BSN, RN

## 2016-10-06 ENCOUNTER — Encounter (HOSPITAL_COMMUNITY): Payer: Self-pay

## 2016-10-06 DIAGNOSIS — I132 Hypertensive heart and chronic kidney disease with heart failure and with stage 5 chronic kidney disease, or end stage renal disease: Secondary | ICD-10-CM | POA: Diagnosis present

## 2016-10-06 DIAGNOSIS — Z86718 Personal history of other venous thrombosis and embolism: Secondary | ICD-10-CM | POA: Diagnosis not present

## 2016-10-06 DIAGNOSIS — Z992 Dependence on renal dialysis: Secondary | ICD-10-CM

## 2016-10-06 DIAGNOSIS — N2581 Secondary hyperparathyroidism of renal origin: Secondary | ICD-10-CM | POA: Diagnosis not present

## 2016-10-06 DIAGNOSIS — K529 Noninfective gastroenteritis and colitis, unspecified: Secondary | ICD-10-CM | POA: Diagnosis present

## 2016-10-06 DIAGNOSIS — D631 Anemia in chronic kidney disease: Secondary | ICD-10-CM

## 2016-10-06 DIAGNOSIS — G4700 Insomnia, unspecified: Secondary | ICD-10-CM | POA: Diagnosis present

## 2016-10-06 DIAGNOSIS — Z79899 Other long term (current) drug therapy: Secondary | ICD-10-CM | POA: Diagnosis not present

## 2016-10-06 DIAGNOSIS — N186 End stage renal disease: Secondary | ICD-10-CM

## 2016-10-06 DIAGNOSIS — F4323 Adjustment disorder with mixed anxiety and depressed mood: Secondary | ICD-10-CM | POA: Diagnosis not present

## 2016-10-06 DIAGNOSIS — E1122 Type 2 diabetes mellitus with diabetic chronic kidney disease: Secondary | ICD-10-CM | POA: Diagnosis present

## 2016-10-06 DIAGNOSIS — R1013 Epigastric pain: Secondary | ICD-10-CM | POA: Diagnosis not present

## 2016-10-06 DIAGNOSIS — E875 Hyperkalemia: Secondary | ICD-10-CM | POA: Diagnosis not present

## 2016-10-06 DIAGNOSIS — I429 Cardiomyopathy, unspecified: Secondary | ICD-10-CM | POA: Diagnosis present

## 2016-10-06 DIAGNOSIS — N185 Chronic kidney disease, stage 5: Secondary | ICD-10-CM

## 2016-10-06 DIAGNOSIS — N189 Chronic kidney disease, unspecified: Secondary | ICD-10-CM | POA: Diagnosis not present

## 2016-10-06 DIAGNOSIS — I120 Hypertensive chronic kidney disease with stage 5 chronic kidney disease or end stage renal disease: Secondary | ICD-10-CM | POA: Diagnosis not present

## 2016-10-06 DIAGNOSIS — N2889 Other specified disorders of kidney and ureter: Secondary | ICD-10-CM | POA: Diagnosis present

## 2016-10-06 DIAGNOSIS — M549 Dorsalgia, unspecified: Secondary | ICD-10-CM | POA: Diagnosis not present

## 2016-10-06 DIAGNOSIS — E8889 Other specified metabolic disorders: Secondary | ICD-10-CM | POA: Diagnosis present

## 2016-10-06 DIAGNOSIS — G8929 Other chronic pain: Secondary | ICD-10-CM | POA: Diagnosis present

## 2016-10-06 DIAGNOSIS — Z87891 Personal history of nicotine dependence: Secondary | ICD-10-CM | POA: Diagnosis not present

## 2016-10-06 DIAGNOSIS — I5042 Chronic combined systolic (congestive) and diastolic (congestive) heart failure: Secondary | ICD-10-CM | POA: Diagnosis present

## 2016-10-06 DIAGNOSIS — Z885 Allergy status to narcotic agent status: Secondary | ICD-10-CM | POA: Diagnosis not present

## 2016-10-06 DIAGNOSIS — N181 Chronic kidney disease, stage 1: Secondary | ICD-10-CM | POA: Diagnosis not present

## 2016-10-06 DIAGNOSIS — R109 Unspecified abdominal pain: Secondary | ICD-10-CM | POA: Diagnosis not present

## 2016-10-06 LAB — CBC
HCT: 29.5 % — ABNORMAL LOW (ref 39.0–52.0)
HEMOGLOBIN: 9.3 g/dL — AB (ref 13.0–17.0)
MCH: 26.9 pg (ref 26.0–34.0)
MCHC: 31.5 g/dL (ref 30.0–36.0)
MCV: 85.3 fL (ref 78.0–100.0)
Platelets: 153 10*3/uL (ref 150–400)
RBC: 3.46 MIL/uL — ABNORMAL LOW (ref 4.22–5.81)
RDW: 18.1 % — AB (ref 11.5–15.5)
WBC: 4.6 10*3/uL (ref 4.0–10.5)

## 2016-10-06 LAB — RENAL FUNCTION PANEL
Albumin: 2.7 g/dL — ABNORMAL LOW (ref 3.5–5.0)
Anion gap: 8 (ref 5–15)
BUN: 19 mg/dL (ref 6–20)
CALCIUM: 7.9 mg/dL — AB (ref 8.9–10.3)
CO2: 31 mmol/L (ref 22–32)
CREATININE: 7.69 mg/dL — AB (ref 0.61–1.24)
Chloride: 97 mmol/L — ABNORMAL LOW (ref 101–111)
GFR calc Af Amer: 8 mL/min — ABNORMAL LOW (ref >60)
GFR calc non Af Amer: 7 mL/min — ABNORMAL LOW (ref >60)
GLUCOSE: 71 mg/dL (ref 65–99)
Phosphorus: 3.6 mg/dL (ref 2.5–4.6)
Potassium: 4 mmol/L (ref 3.5–5.1)
SODIUM: 136 mmol/L (ref 135–145)

## 2016-10-06 LAB — HIV ANTIBODY (ROUTINE TESTING W REFLEX): HIV SCREEN 4TH GENERATION: NONREACTIVE

## 2016-10-06 LAB — HEMOGLOBIN A1C
Hgb A1c MFr Bld: 4.6 % — ABNORMAL LOW (ref 4.8–5.6)
MEAN PLASMA GLUCOSE: 85 mg/dL

## 2016-10-06 MED ORDER — LIDOCAINE-PRILOCAINE 2.5-2.5 % EX CREA: 1.0000 "application " | TOPICAL_CREAM | CUTANEOUS | Status: DC | PRN

## 2016-10-06 MED ORDER — SODIUM CHLORIDE 0.9 % IV SOLN: 100.0000 mL | INTRAVENOUS | Status: DC | PRN

## 2016-10-06 MED ORDER — FAMOTIDINE 20 MG PO TABS
20.0000 mg | ORAL_TABLET | Freq: Every day | ORAL | Status: DC
  Administered 2016-10-07 – 2016-10-08 (×2): 20 mg via ORAL
  Filled 2016-10-06 (×3): qty 1

## 2016-10-06 MED ORDER — ZOLPIDEM TARTRATE 5 MG PO TABS
5.0000 mg | ORAL_TABLET | Freq: Once | ORAL | Status: AC
  Administered 2016-10-06: 5 mg via ORAL
  Filled 2016-10-06: qty 1

## 2016-10-06 MED ORDER — HYDROXYZINE HCL 25 MG PO TABS
ORAL_TABLET | ORAL | Status: AC
  Administered 2016-10-06: 25 mg via ORAL
  Filled 2016-10-06: qty 1

## 2016-10-06 MED ORDER — LIDOCAINE HCL (PF) 1 % IJ SOLN: 5.0000 mL | INTRAMUSCULAR | Status: DC | PRN

## 2016-10-06 MED ORDER — OXYCODONE-ACETAMINOPHEN 5-325 MG PO TABS
ORAL_TABLET | ORAL | Status: AC
Start: 2016-10-06 — End: 2016-10-06
  Administered 2016-10-06: 2 via ORAL
  Filled 2016-10-06: qty 2

## 2016-10-06 MED ORDER — HEPARIN SODIUM (PORCINE) 1000 UNIT/ML DIALYSIS: 40.0000 [IU]/kg | INTRAMUSCULAR | Status: DC | PRN

## 2016-10-06 MED ORDER — SODIUM CHLORIDE 0.9 % IV SOLN
500.0000 mg | Freq: Every day | INTRAVENOUS | Status: DC
  Administered 2016-10-06: 500 mg via INTRAVENOUS
  Filled 2016-10-06: qty 0.5

## 2016-10-06 MED ORDER — CALCITRIOL 0.5 MCG PO CAPS
1.0000 ug | ORAL_CAPSULE | ORAL | Status: DC
  Administered 2016-10-07: 1 ug via ORAL

## 2016-10-06 MED ORDER — HYDROXYZINE HCL 25 MG PO TABS
25.0000 mg | ORAL_TABLET | Freq: Once | ORAL | Status: AC
  Administered 2016-10-06: 25 mg via ORAL

## 2016-10-06 MED ORDER — DARBEPOETIN ALFA 60 MCG/0.3ML IJ SOSY
60.0000 ug | PREFILLED_SYRINGE | INTRAMUSCULAR | Status: DC
  Administered 2016-10-07: 60 ug via INTRAVENOUS
  Filled 2016-10-06: qty 0.3

## 2016-10-06 MED ORDER — ALTEPLASE 2 MG IJ SOLR: 2.0000 mg | Freq: Once | INTRAMUSCULAR | Status: DC | PRN

## 2016-10-06 MED ORDER — HEPARIN SODIUM (PORCINE) 1000 UNIT/ML DIALYSIS: 1000.0000 [IU] | INTRAMUSCULAR | Status: DC | PRN

## 2016-10-06 MED ORDER — PENTAFLUOROPROP-TETRAFLUOROETH EX AERO: 1.0000 "application " | INHALATION_SPRAY | CUTANEOUS | Status: DC | PRN

## 2016-10-06 NOTE — Progress Notes (Signed)
Pt refused, Joelene Millin Notified

## 2016-10-06 NOTE — Progress Notes (Signed)
[facility] PROGRESS NOTE  Frank [name] GGP:[id number] DOB: [birth year] DOA: 10/05/2016   PCP: [name] Patient     Assessment/Plan: Active Problems:   Anemia of chronic kidney failure   Adjustment disorder with mixed anxiety and depressed mood   Hyperkalemia   Chronic back pain   End-stage renal disease on hemodialysis (HCC)   Abdominal pain   51 y.o. male with medical history significant of ESRD, NICM with diastolic and systolic CHF, HTN, multiple admission to the hospital with acute on chronic abdominal pain. Patient sent from [facility] ED with acute on chronic abdominal pain and hyperkalemia. Patient was in [facility] ED the day PTA, with same complaints of nausea, vomiting and abdominal pain, patient tracts can spend the whole day in the ED and he he missed dialysis and he was discharged from the ED home. He came in earlier today to [facility] ED with the same complaints. Per ED reports, potassium was 6.2, patient have slightly peaked T waves, patient admitted for further evaluation of this abdominal pain and hyperkalemia  Assessment and plan Acute on chronic abdominal pain -Presented with nausea, vomiting and diffuse but mostly epigastric abdominal pain. -His pain is acute on chronic, been evaluated extensively before. No hematemesis or hematochezia. -CT scan abdomen and pelvis showed 4.4 x 6.1 cm air-fluid level, likely representing mildly dilated loop of large intestine less likely abscess, suspicious for enterocolitis Currently on clear liquid diet    Hyperkalemia: Secondary to missed dialysis and likely dietary indiscretions. Dialyzed 3/17  End-stage renal disease: underwent hemodialysis yesterday after missed treatment on Friday. His next hemodialysis is scheduled for tomorrow -Nephrology consulted, appreciate Dr. Serita Grit help.    Chronic back pain -Patient is on multiple pain medications for his back pain, continued Neurontin and  Percocet.  Hypertension -Restarted home medications.  Chronic combined systolic and diastolic CHF -Secondary to nonischemic cardiomyopathy, currently on room air. -LVEF was 23% in February 2017, improved to 35-40% in April 2017. -I'll defer doing a chest x-ray as he is not short of breath or hypoxic. -Started home medications including Coreg, consider ACEI or ARB.      DVT prophylaxsis heparin  Code Status:  Full code   Family Communication: Discussed in detail with the patient, all imaging results, lab results explained to the patient   Disposition Plan:  As above       Consultants:  Nephrology  Procedures:  None  Antibiotics: Anti-infectives    None         HPI/Subjective: Refuses to answer questions  Objective: Vitals:   10/06/16 0145 10/06/16 0215 10/06/16 0245 10/06/16 0315  BP: 108/82 (!) 170/93 (!) 173/89 (!) 170/88  Pulse: 84 90 78 79  Resp: _0 Temp:    98.2 F (36.8 C)  TempSrc:    Oral  SpO2:    93%  Weight:        Intake/Output Summary (Last 24 hours) at 10/06/16 1035 Last data filed at 10/06/16 0600  Gross per 24 hour  Intake              600 ml  Output             2500 ml  Net            -1900 ml    Exam:  Examination:  General exam: Appears calm and comfortable  Respiratory system: Clear to auscultation. Respiratory effort normal. Cardiovascular system: S1 & S2 heard,  RRR. No JVD, murmurs, rubs, gallops or clicks. No pedal edema. Gastrointestinal system: Abdomen is nondistended, soft and nontender. No organomegaly or masses felt. Normal bowel sounds heard. Central nervous system: Alert and oriented. No focal neurological deficits. Extremities: Symmetric 5 x 5 power. Skin: No rashes, lesions or ulcers Psychiatry: Judgement and insight appear normal. Mood & affect appropriate.     Data Reviewed: I have personally reviewed following labs and imaging studies  Micro Results No results found for this or any  previous visit (from the past 240 hour(s)).  Radiology Reports Ct Abdomen Pelvis W Contrast  Result Date: 09/27/2016 CLINICAL DATA:  Epigastric pain and vomiting EXAM: CT ABDOMEN AND PELVIS WITH CONTRAST TECHNIQUE: Multidetector CT imaging of the abdomen and pelvis was performed using the standard protocol following bolus administration of intravenous contrast. CONTRAST:  72m ISOVUE-300 IOPAMIDOL (ISOVUE-300) INJECTION 61%, 1053mISOVUE-300 IOPAMIDOL (ISOVUE-300) INJECTION 61% COMPARISON:  07/29/2016 CT FINDINGS: Lower chest: Cardiomegaly with bibasilar dependent atelectasis. No pericardial effusion. Hepatobiliary: No focal liver abnormality is seen. Subtle hypodense appearance of the liver may reflect mild fatty infiltration. No gallstones, gallbladder wall thickening, or biliary dilatation. Pancreas: Unremarkable. No pancreatic ductal dilatation or surrounding inflammatory changes. Spleen: Normal in size without focal abnormality. Adrenals/Urinary Tract: Native kidneys are atrophic in appearance. Cysts of the left kidney are stable in appearance, the largest is approximately 4.1 cm. The urinary bladder is nondistended and slightly thick-walled as a result. Left pelvic transplanted kidney demonstrates prominent vascular calcifications. Stomach/Bowel: Nondistended stomach. Normal small bowel rotation. There are mildly dilated fluid-filled distal small bowel loops with fluid within nondistended nonobstructed appearing large bowel potentially representing an adynamic ileus or enterocolitis. Transition point is not visualized. There are loops of nondistended contracted appearing small bowel in the left hemiabdomen. Centrally within the abdomen, deep to the umbilicus is a 4.4 x 6.1 cm structure containing an air-fluid level. This likely represents a mildly dilated loop of large intestine based on what appears to be a haustral markings on the lateral view and less likely an extraluminal abscess. A follow-up CT with  oral contrast may for better assessment on this matter if clinically warranted. Vascular/Lymphatic: Density aortoiliac atherosclerosis. No adenopathy. Reproductive: Prostate is mildly enlarged to 4.7 cm appear Other: No abdominal wall hernia or abnormality. No abdominopelvic ascites. Musculoskeletal: No acute or significant osseous findings. IMPRESSION: Mild to moderate fluid-filled distention of small large bowel suspicious for an enterocolitis. No focal mechanical source of obstruction is identified. Adynamic ileus is also possibility. Atrophic native kidneys with left-sided renal cysts. Transplanted left pelvic kidney with extensive vascular calcification. Cardiomegaly without acute pulmonary disease. Electronically Signed   By: DaAshley Royalty.D.   On: 09/27/2016 03:12   Dg Abd Acute W/chest  Result Date: 09/27/2016 CLINICAL DATA:  Abdominal pain, history of CHF, end-stage renal disease, hypertension, diabetes and renal transplant. EXAM: DG ABDOMEN ACUTE W/ 1V CHEST COMPARISON:  08/05/2016 CXR, CT abdomen and pelvis 118 18 FINDINGS: Stable cardiomegaly with aortic atherosclerosis. No overt pulmonary edema, effusion or pneumothorax. No pulmonary consolidation.No bowel obstruction or free air. Calcifications in the left hemipelvis associated with transplanted left kidney with atherosclerosis. No acute osseous abnormality. IMPRESSION: Stable cardiomegaly with aortic atherosclerosis. No bowel obstruction or free air. Electronically Signed   By: DaAshley Royalty.D.   On: 09/27/2016 00:01   UsKoreabdomen Limited Ruq  Result Date: 10/04/2016 CLINICAL DATA:  Abdominal pain for 1 day. History of renal transplant and inguinal hernia repair. History of dialysis, diabetes, hypertension, CHF. EXAM:  US ABDOMEN LIMITED - RIGHT UPPER QUADRANT COMPARISON:  CT abdomen and pelvis 09/27/2016 FINDINGS: Gallbladder: No gallstones or wall thickening visualized. No sonographic Murphy sign noted by sonographer. Common bile duct:  Diameter: 3.2 mm, normal Liver: No focal lesion identified. Within normal limits in parenchymal echogenicity. Incidental note of right renal atrophy. IMPRESSION: No evidence of cholelithiasis or cholecystitis. Right renal atrophy. Electronically Signed   By: Lucienne Capers M.D.   On: 10/04/2016 05:58     CBC  Recent Labs Lab 10/03/16 2229 10/05/16 1821 10/06/16 0627  WBC 5.0 7.5 4.6  HGB 10.2* 11.0* 9.3*  HCT 33.1* 35.1* 29.5*  PLT 178 186 153  MCV 86.9 86.2 85.3  MCH 26.8 27.0 26.9  MCHC 30.8 31.3 31.5  RDW 17.9* 18.0* 18.1*    Chemistries   Recent Labs Lab 10/03/16 2229 10/05/16 1821 10/06/16 0627  NA 139 134* 136  K 4.8 5.0 4.0  CL 96* 92* 97*  CO2 _0 GLUCOSE 93 88 71  BUN 33* 53* 19  CREATININE 11.11* 14.13* 7.69*  CALCIUM 7.7* 7.7* 7.9*  AST 22  --   --   ALT 9*  --   --   ALKPHOS 170*  --   --   BILITOT 0.5  --   --    ------------------------------------------------------------------------------------------------------------------ estimated creatinine clearance is 11.6 mL/min (A) (by C-G formula based on SCr of 7.69 mg/dL (H)). ------------------------------------------------------------------------------------------------------------------ No results for input(s): HGBA1C in the last 72 hours. ------------------------------------------------------------------------------------------------------------------ No results for input(s): CHOL, HDL, LDLCALC, TRIG, CHOLHDL, LDLDIRECT in the last 72 hours. ------------------------------------------------------------------------------------------------------------------  Recent Labs  10/05/16 1821  TSH 1.784   ------------------------------------------------------------------------------------------------------------------ No results for input(s): VITAMINB12, FOLATE, FERRITIN, TIBC, IRON, RETICCTPCT in the last 72 hours.  Coagulation profile  Recent Labs Lab 10/05/16 1821  INR 1.14    No results  for input(s): DDIMER in the last 72 hours.  Cardiac Enzymes No results for input(s): CKMB, TROPONINI, MYOGLOBIN in the last 168 hours.  Invalid input(s): CK ------------------------------------------------------------------------------------------------------------------ Invalid input(s): POCBNP   CBG: No results for input(s): GLUCAP in the last 168 hours.     Studies: No results found.    Lab Results  Component Value Date   HGBA1C 5.5 08/04/2016   HGBA1C 5.4 02/11/2015   HGBA1C 5.9 (H) 01/31/2015   Lab Results  Component Value Date   CREATININE 7.69 (H) 10/06/2016       Scheduled Meds: . allopurinol  100 mg Oral Daily  . amLODipine  5 mg Oral QHS  . atorvastatin  40 mg Oral q1800  . [START ON 10/07/2016] calcitRIOL  1 mcg Oral Q M,W,F-HD  . carvedilol  25 mg Oral BID WC  . cinacalcet  30 mg Oral QPM  . clonazePAM  1 mg Oral BID  . [START ON 10/07/2016] cloNIDine  0.3 mg Transdermal Q Mon  . [START ON 10/07/2016] darbepoetin (ARANESP) injection - DIALYSIS  60 mcg Intravenous Q Mon-HD  . doxazosin  2 mg Oral Daily  . famotidine  20 mg Oral BID  . gabapentin  100 mg Oral QHS  . heparin  5,000 Units Subcutaneous Q8H  . isosorbide mononitrate  60 mg Oral Daily  . pantoprazole  40 mg Oral Daily  . sevelamer carbonate  1,600 mg Oral TID WC  . sodium chloride flush  3 mL Intravenous Q12H   Continuous Infusions: . sodium chloride 10 mL/hr at 10/05/16 1818     LOS: 1 day    Time spent: >30 MINS  [facility] Pager [phone]. If 7PM-7AM, please contact night-coverage at [email or web address], password [facility] 10/06/2016, 10:35 AM  LOS: 1 day

## 2016-10-06 NOTE — Progress Notes (Signed)
Patient requested medication for sleep.  [facility] called.  Received order for Ambien 5 mg which was given to patient at 2147.  Will continue to monitor patient.  Earleen Reaper RN-BC, [employer]

## 2016-10-06 NOTE — Progress Notes (Signed)
Pharmacy Antibiotic Note  Frank [name] is a 51 y.o. male w/ ESRD on HD MWF admitted on 10/05/2016 with abdominal pain. Pharmacy has been consulted for meropenem dosing for intra-abdominal abscess.  Patient is currently afebrile with WBC within normal limits. Per nephrology note, he was dialyzed yesterday off-schedule and should return to normal schedule tomorrow (Mon).   Plan: Meropenem 545m IV every 24 hours Monitor HD schedule, clinical progress  F/U CT chest and abd   Weight:  (pt refused, Kimberly Notified)  Temp (24hrs), Avg:98 F (36.7 C), Min:97.6 F (36.4 C), Max:98.2 F (36.8 C)   Recent Labs Lab 10/03/16 2229 10/05/16 1821 10/06/16 0627  WBC 5.0 7.5 4.6  CREATININE 11.11* 14.13* 7.69*    Estimated Creatinine Clearance: 11.6 mL/min (A) (by C-G formula based on SCr of 7.69 mg/dL (H)).    Allergies  Allergen Reactions  . Butalbital-Apap-Caffeine Shortness Of Breath, Swelling and Other (See Comments)    Swelling in throat  . Ferrlecit [Na Ferric Gluc Cplx In Sucrose] Shortness Of Breath, Swelling and Other (See Comments)    Swelling in throat, tolerates Venofor  . Minoxidil Shortness Of Breath  . Tylenol [Acetaminophen] Anaphylaxis and Swelling  . Darvocet [Propoxyphene N-Acetaminophen] Hives    Antimicrobials this admission: 3/18 mero >>   Dose adjustments this admission: N/A  Microbiology results: N/A  Thank you for allowing pharmacy to be a part of this patient's care.  MDemetrius Charity PharmD Acute Care Pharmacy Resident  Pager: [phone]/18/2018

## 2016-10-06 NOTE — Progress Notes (Signed)
Patient ID: Frank [name], male   DOB: 1965-11-04, 51 y.o.   MRN: [id number]  [facility] Progress Note   Assessment/ Plan:   1. Abdominal pain-acute on chronic: Ongoing management for possible pancreatitis-now tolerating clear liquids with some improvement of abdominal pain. No additional etiology noted from recent CT scan/RUQ ultrasound. 2. End-stage renal disease:  underwent hemodialysis yesterday after missed treatment on Friday. His next hemodialysis is scheduled for tomorrow. 3. Hyperkalemia: Corrected with hemodialysis. 4. Hypertension: Unclear regarding his compliance with oral and hypertensive therapy, attempt ultrafiltration with hemodialysis today to challenge dry weight. 5. Anemia of chronic kidney disease: Hemoglobin 2 days ago was 10.2, resume Aranesp with hemodialysis next week. 6. Metabolic bone disease: Continue binders and reconcile medication for VDRA.  Subjective:   Reports to have had a fair night with intermittent abdominal pain and insomnia    Objective:   BP (!) 170/88 (BP Location: Right Arm)   Pulse 79   Temp 98.2 F (36.8 C) (Oral)   Resp 16   Wt 77 kg (169 lb 12.1 oz)   SpO2 93%   BMI 24.36 kg/m   Physical Exam: OHF:[id number] sleeping in bed CVS: Pulse regular in rate and rhythm, normal S1 and S2 Resp: Poor inspiratory effort, diminished breath sounds at the bases otherwise clear Abd: Soft, flat, minimal tenderness over epigastric area Ext: Trace lower extremity edema, left radiocephalic fistula with good thrill  Labs: BMET  Recent Labs Lab 10/03/16 2229 10/05/16 1821 10/06/16 0627  NA 139 134* 136  K 4.8 5.0 4.0  CL 96* 92* 97*  CO2 _0 GLUCOSE 93 88 71  BUN 33* 53* 19  CREATININE 11.11* 14.13* 7.69*  CALCIUM 7.7* 7.7* 7.9*  PHOS  --   --  3.6   CBC  Recent Labs Lab 10/03/16 2229 10/05/16 1821 10/06/16 0627  WBC 5.0 7.5 4.6  HGB 10.2* 11.0* 9.3*  HCT 33.1* 35.1* 29.5*  MCV 86.9 86.2 85.3  PLT 178 186 153    Medications:    . allopurinol  100 mg Oral Daily  . amLODipine  5 mg Oral QHS  . atorvastatin  40 mg Oral q1800  . carvedilol  25 mg Oral BID WC  . cinacalcet  30 mg Oral QPM  . clonazePAM  1 mg Oral BID  . [START ON 10/07/2016] cloNIDine  0.3 mg Transdermal Q Mon  . doxazosin  2 mg Oral Daily  . famotidine  20 mg Oral BID  . gabapentin  100 mg Oral QHS  . heparin  5,000 Units Subcutaneous Q8H  . isosorbide mononitrate  60 mg Oral Daily  . pantoprazole  40 mg Oral Daily  . sevelamer carbonate  1,600 mg Oral TID WC  . sodium chloride flush  3 mL Intravenous Q12H   Elmarie Shiley, MD 10/06/2016, 8:18 AM

## 2016-10-06 NOTE — Progress Notes (Signed)
During the night, the patient refused skin assessment, standing weight, heparin injections, vital signs after returning to room after completion of hemodialysis.  Also, refused to be swabbed for MRSA and for EKG to be done.  Will continue to monitor patient.  [employer] RN-BC, WTA.

## 2016-10-07 ENCOUNTER — Inpatient Hospital Stay (HOSPITAL_COMMUNITY): Payer: Medicare Other

## 2016-10-07 ENCOUNTER — Encounter (HOSPITAL_COMMUNITY): Payer: Self-pay | Admitting: *Deleted

## 2016-10-07 DIAGNOSIS — N181 Chronic kidney disease, stage 1: Secondary | ICD-10-CM

## 2016-10-07 LAB — RENAL FUNCTION PANEL
Albumin: 2.9 g/dL — ABNORMAL LOW (ref 3.5–5.0)
Anion gap: 11 (ref 5–15)
BUN: 38 mg/dL — AB (ref 6–20)
CHLORIDE: 96 mmol/L — AB (ref 101–111)
CO2: 29 mmol/L (ref 22–32)
Calcium: 7.7 mg/dL — ABNORMAL LOW (ref 8.9–10.3)
Creatinine, Ser: 11.16 mg/dL — ABNORMAL HIGH (ref 0.61–1.24)
GFR calc Af Amer: 5 mL/min — ABNORMAL LOW (ref >60)
GFR, EST NON AFRICAN AMERICAN: 5 mL/min — AB (ref >60)
GLUCOSE: 99 mg/dL (ref 65–99)
POTASSIUM: 4.9 mmol/L (ref 3.5–5.1)
Phosphorus: 5.5 mg/dL — ABNORMAL HIGH (ref 2.5–4.6)
Sodium: 136 mmol/L (ref 135–145)

## 2016-10-07 MED ORDER — CALCITRIOL 0.5 MCG PO CAPS: 1.0000 ug | ORAL_CAPSULE | ORAL | 0 refills | Status: DC

## 2016-10-07 MED ORDER — METRONIDAZOLE 500 MG PO TABS: 500.0000 mg | ORAL_TABLET | Freq: Three times a day (TID) | ORAL | 0 refills | Status: DC

## 2016-10-07 MED ORDER — DARBEPOETIN ALFA 60 MCG/0.3ML IJ SOSY
PREFILLED_SYRINGE | INTRAMUSCULAR | Status: AC
  Administered 2016-10-07: 60 ug via INTRAVENOUS
  Filled 2016-10-07: qty 0.3

## 2016-10-07 MED ORDER — CIPROFLOXACIN HCL 500 MG PO TABS: 500.0000 mg | ORAL_TABLET | Freq: Every day | ORAL | 0 refills | Status: DC

## 2016-10-07 MED ORDER — OXYCODONE-ACETAMINOPHEN 5-325 MG PO TABS
ORAL_TABLET | ORAL | Status: AC
  Filled 2016-10-07: qty 1

## 2016-10-07 MED ORDER — METRONIDAZOLE 500 MG PO TABS
500.0000 mg | ORAL_TABLET | Freq: Three times a day (TID) | ORAL | Status: DC
  Administered 2016-10-07 – 2016-10-08 (×5): 500 mg via ORAL
  Filled 2016-10-07 (×5): qty 1

## 2016-10-07 MED ORDER — HEPARIN SODIUM (PORCINE) 1000 UNIT/ML DIALYSIS: 40.0000 [IU]/kg | INTRAMUSCULAR | Status: DC | PRN

## 2016-10-07 MED ORDER — OXYCODONE-ACETAMINOPHEN 5-325 MG PO TABS
1.0000 | ORAL_TABLET | ORAL | Status: DC | PRN
  Administered 2016-10-07: 1 via ORAL
  Administered 2016-10-07 (×2): 2 via ORAL
  Administered 2016-10-07: 1 via ORAL
  Administered 2016-10-08 (×4): 2 via ORAL
  Filled 2016-10-07 (×6): qty 2

## 2016-10-07 MED ORDER — IOPAMIDOL (ISOVUE-300) INJECTION 61%
INTRAVENOUS | Status: AC
Start: 2016-10-07 — End: 2016-10-08
  Filled 2016-10-07: qty 30

## 2016-10-07 MED ORDER — CALCITRIOL 0.5 MCG PO CAPS
ORAL_CAPSULE | ORAL | Status: AC
  Administered 2016-10-07: 1 ug via ORAL
  Filled 2016-10-07: qty 2

## 2016-10-07 MED ORDER — CIPROFLOXACIN HCL 500 MG PO TABS
500.0000 mg | ORAL_TABLET | Freq: Every day | ORAL | Status: DC
  Administered 2016-10-07 – 2016-10-08 (×2): 500 mg via ORAL
  Filled 2016-10-07 (×2): qty 1

## 2016-10-07 MED ORDER — METHOCARBAMOL 500 MG PO TABS: 500.0000 mg | ORAL_TABLET | Freq: Four times a day (QID) | ORAL | 0 refills | Status: DC | PRN

## 2016-10-07 NOTE — Progress Notes (Signed)
[name] Progress Note   Subjective: back hurts off and on, worse with dialysis. Worried about his 'kidney cancer", diagnosed last year.  Per records was last seen in [facility] system by urology Dr Diona Fanti in April 2017, they said he needs a nephrectomy.   Vitals:   10/06/16 1808 10/06/16 2142 10/07/16 0500 10/07/16 1046  BP:  (!) 155/89 (!) 155/86 (!) 144/88  Pulse:  75 72 80  Resp:  _0 Temp:  98.4 F (36.9 C) 97.7 F (36.5 C) 97.9 F (36.6 C)  TempSrc:  Oral Oral Oral  SpO2:  96% 95% 95%  Weight:  78.1 kg (172 lb 2.9 oz)    Height: 6' 2" (1.88 m)       Inpatient medications: . allopurinol  100 mg Oral Daily  . amLODipine  5 mg Oral QHS  . atorvastatin  40 mg Oral q1800  . calcitRIOL  1 mcg Oral Q M,W,F-HD  . carvedilol  25 mg Oral BID WC  . cinacalcet  30 mg Oral QPM  . ciprofloxacin  500 mg Oral Q1200  . clonazePAM  1 mg Oral BID  . cloNIDine  0.3 mg Transdermal Q Mon  . darbepoetin (ARANESP) injection - DIALYSIS  60 mcg Intravenous Q Mon-HD  . doxazosin  2 mg Oral Daily  . famotidine  20 mg Oral Daily  . gabapentin  100 mg Oral QHS  . heparin  5,000 Units Subcutaneous Q8H  . isosorbide mononitrate  60 mg Oral Daily  . metroNIDAZOLE  500 mg Oral Q8H  . pantoprazole  40 mg Oral Daily  . sevelamer carbonate  1,600 mg Oral TID WC  . sodium chloride flush  3 mL Intravenous Q12H   . sodium chloride 10 mL/hr at 10/05/16 1818   sodium chloride, sodium chloride, alteplase, heparin, lidocaine (PF), lidocaine-prilocaine, methocarbamol, ondansetron **OR** ondansetron (ZOFRAN) IV, oxyCODONE-acetaminophen, pentafluoroprop-tetrafluoroeth, senna-docusate  Exam: Alert, no distress No jvd Chest clear bilat RRR no mrg Abd soft , ntnd no ascites Lower back painful to percussion over L2-L4 Ext no edema L forearm aVF+bruit NF, Ox 3  Dialysis: MWF [facility] 4h   2/2.25 bath  Hep 2000   LFA AVF       Assessment: 1. Abd pain/ nausea/ vomit/  diarrhea - per primary 2. Back pain - concerned with history of untreated renal mass (likely RCC) about poss of mets 3. Renal mass, Left - last seen by urology April 2017, have d/w primary team 4. ESRD on HD MWF 5. Volume is 2-3kg up 6. HTN -stable, on 4 bp meds 7. Dispo - poss dc soon , per primary  Plan - HD today upstairs.  MRI of LS and thoracic spine today, r/o mets.    Kelly Splinter MD [facility] pager [phone]   10/07/2016, 12:04 PM    Recent Labs Lab 10/03/16 2229 10/05/16 1821 10/06/16 0627  NA 139 134* 136  K 4.8 5.0 4.0  CL 96* 92* 97*  CO2 _1 GLUCOSE 93 88 71  BUN 33* 53* 19  CREATININE 11.11* 14.13* 7.69*  CALCIUM 7.7* 7.7* 7.9*  PHOS  --   --  3.6    Recent Labs Lab 10/03/16 2229 10/06/16 0627  AST 22  --   ALT 9*  --   ALKPHOS 170*  --   BILITOT 0.5  --   PROT 7.2  --   ALBUMIN 3.3* 2.7*    Recent Labs Lab 10/03/16 2229 10/05/16 1821  10/06/16 0627  WBC 5.0 7.5 4.6  HGB 10.2* 11.0* 9.3*  HCT 33.1* 35.1* 29.5*  MCV 86.9 86.2 85.3  PLT 178 186 153   Iron/TIBC/Ferritin/ %Sat    Component Value Date/Time   IRON 41 (L) 05/29/2016 1350   TIBC 190 (L) 05/29/2016 1350   FERRITIN 328 11/02/2015 0346   IRONPCTSAT 22 05/29/2016 1350

## 2016-10-07 NOTE — Progress Notes (Signed)
Patient still refusing for RN to place ordered telemetry monitor on. RN notified Dr Allyson Sabal of this and she stated to document that patient is refusing. CCMD notified of this as well.   MRI notified RN that they are sending for patient and they requested that he be pain medicated prior to coming to MRI, if possible, due to patient previously having difficulty laying still due to pain. RN notified Dr Allyson Sabal that patient's pain medication ordered every 6 hours PRN but it will not be time for med until after 1230, which would possibly cause the MRI and patient's dialysis schedule to overlap. Dr Allyson Sabal gave RN verbal orders to change medication to every 4 hours PRN. RN modified order and will provide patient with medication prior to patient leaving unit for MRI.

## 2016-10-07 NOTE — Progress Notes (Signed)
Patient requested that MD be called for something for sleep.  [facility] called.  Received order for Ambien 5 mg PO.  Medication given at 2247.  Will continue to monitor patient.  [employer] RN-BC, WTA.

## 2016-10-07 NOTE — Progress Notes (Signed)
MRI notified RN that patient would be in MRI for his ordered scans until close to 1330 or 1400 and MRI was questioning if this would conflict with his dialysis schedule. RN spoke to dialysis nurse that would be conducting patient's HD treatment and he said that 1330 or 1400 would still be OK for patient's dialysis schedule as he currently was doing a treatment for another patient. RN notified MRI and medicated patient for scans.

## 2016-10-07 NOTE — Discharge Summary (Signed)
Physician Discharge Summary  Frank [name] MRN: [id number] DOB/AGE: [birth year] 51 y.o.  PCP: [name] Patient   Admit date: 10/05/2016 Discharge date: 10/07/2016  Discharge Diagnoses:    Active Problems:   Anemia of chronic kidney failure   Adjustment disorder with mixed anxiety and depressed mood   Hyperkalemia   Chronic back pain   End-stage renal disease on hemodialysis (Frank [name])   Abdominal pain    Follow-up recommendations Follow-up with PCP in 3-5 days , including all  additional recommended appointments as below Follow-up CBC, CMP in 3-5 days Patient recommended to follow up with Franchot Gallo MD   ADDENDUM Dc cancelled as patient endorsees pain, still nauseous Will repeat CT with PO contrast , he is agreeable to proceed    Current Discharge Medication List    START taking these medications   Details  calcitRIOL (ROCALTROL) 0.5 MCG capsule Take 2 capsules (1 mcg total) by mouth every Monday, Wednesday, and Friday with hemodialysis. Qty: 24 capsule, Refills: 0    ciprofloxacin (CIPRO) 500 MG tablet Take 1 tablet (500 mg total) by mouth daily at 12 noon. Qty: 7 tablet, Refills: 0    !! methocarbamol (ROBAXIN) 500 MG tablet Take 1 tablet (500 mg total) by mouth every 6 (six) hours as needed for muscle spasms. Qty: 60 tablet, Refills: 0    metroNIDAZOLE (FLAGYL) 500 MG tablet Take 1 tablet (500 mg total) by mouth every 8 (eight) hours. Qty: 21 tablet, Refills: 0     !! - Potential duplicate medications found. Please discuss with provider.    CONTINUE these medications which have NOT CHANGED   Details  amLODipine (NORVASC) 5 MG tablet Take 1 tablet (5 mg total) by mouth at bedtime. Qty: 30 tablet, Refills: 1    doxazosin (CARDURA) 2 MG tablet Take 1 tablet (2 mg total) by mouth daily. Qty: 30 tablet, Refills: 1    acetaminophen-codeine (TYLENOL #3) 300-30 MG tablet Take 1 tablet by mouth every 6 (six) hours as needed. Qty: 60 tablet, Refills: 0   Associated  Diagnoses: Generalized anxiety disorder    allopurinol (ZYLOPRIM) 100 MG tablet Take 100 mg by mouth daily.    ALPRAZolam (XANAX) 1 MG tablet Take 1 tablet (1 mg total) by mouth at bedtime as needed for sleep. Qty: 6 tablet, Refills: 0    atorvastatin (LIPITOR) 40 MG tablet Take 1 tablet (40 mg total) by mouth daily at 6 PM. Qty: 30 tablet, Refills: 1    carvedilol (COREG) 25 MG tablet Take 1 tablet (25 mg total) by mouth 2 (two) times daily with a meal. Qty: 30 tablet, Refills: 1    cinacalcet (SENSIPAR) 30 MG tablet Take 30 mg by mouth every evening.     clonazePAM (KLONOPIN) 1 MG tablet Take 1 mg by mouth 2 (two) times daily.    cloNIDine (CATAPRES - DOSED IN MG/24 HR) 0.3 mg/24hr patch Place 1 patch (0.3 mg total) onto the skin every Monday. Qty: 4 patch, Refills: 1    famotidine (PEPCID) 20 MG tablet Take 1 tablet (20 mg total) by mouth 2 (two) times daily. Qty: 30 tablet, Refills: 0    gabapentin (NEURONTIN) 100 MG capsule Take 1 capsule (100 mg total) by mouth at bedtime.    isosorbide mononitrate (IMDUR) 60 MG 24 hr tablet Take 1 tablet (60 mg total) by mouth daily. Qty: 30 tablet, Refills: 1    !! methocarbamol (ROBAXIN) 500 MG tablet Take 1 tablet (500 mg total) by mouth 2 (two)  times daily as needed for muscle spasms. Qty: 10 tablet, Refills: 0    omeprazole (PRILOSEC) 20 MG capsule Take 1 capsule (20 mg total) by mouth daily. Qty: 30 capsule, Refills: 1    ondansetron (ZOFRAN ODT) 4 MG disintegrating tablet Take 1 tablet (4 mg total) by mouth every 8 (eight) hours as needed for nausea or vomiting. Qty: 10 tablet, Refills: 0    senna-docusate (SENOKOT-S) 8.6-50 MG tablet Take 1 tablet by mouth at bedtime as needed for mild constipation. Qty: 30 tablet, Refills: 0    sevelamer carbonate (RENVELA) 800 MG tablet Take 1,600 mg by mouth 3 (three) times daily with meals.      !! - Potential duplicate medications found. Please discuss with provider.    STOP taking  these medications     oxyCODONE-acetaminophen (PERCOCET/ROXICET) 5-325 MG tablet           Discharge Condition:  Stable   Discharge Instructions Get Medicines reviewed and adjusted: Please take all your medications with you for your next visit with your Primary MD  Please request your Primary MD to go over all hospital tests and procedure/radiological results at the follow up, please ask your Primary MD to get all Hospital records sent to his/her office.  If you experience worsening of your admission symptoms, develop shortness of breath, life threatening emergency, suicidal or homicidal thoughts you must seek medical attention immediately by calling 911 or calling your MD immediately if symptoms less severe.  You must read complete instructions/literature along with all the possible adverse reactions/side effects for all the Medicines you take and that have been prescribed to you. Take any new Medicines after you have completely understood and accpet all the possible adverse reactions/side effects.   Do not drive when taking Pain medications.   Do not take more than prescribed Pain, Sleep and Anxiety Medications  Special Instructions: If you have smoked or chewed Tobacco in the last 2 yrs please stop smoking, stop any regular Alcohol and or any Recreational drug use.  Wear Seat belts while driving.  Please note  You were cared for by a hospitalist during your hospital stay. Once you are discharged, your primary care physician will handle any further medical issues. Please note that NO REFILLS for any discharge medications will be authorized once you are discharged, as it is imperative that you return to your primary care physician (or establish a relationship with a primary care physician if you do not have one) for your aftercare needs so that they can reassess your need for medications and monitor your lab values.     Allergies  Allergen Reactions  .  Butalbital-Apap-Caffeine Shortness Of Breath, Swelling and Other (See Comments)    Swelling in throat  . Ferrlecit [Na Ferric Gluc Cplx In Sucrose] Shortness Of Breath, Swelling and Other (See Comments)    Swelling in throat, tolerates Venofor  . Minoxidil Shortness Of Breath  . Tylenol [Acetaminophen] Anaphylaxis and Swelling  . Darvocet [Propoxyphene N-Acetaminophen] Hives      Disposition: 01-Home or Self Care   Consults: * Nephrology  Significant Diagnostic Studies:  Ct Abdomen Pelvis W Contrast  Result Date: 09/27/2016 CLINICAL DATA:  Epigastric pain and vomiting EXAM: CT ABDOMEN AND PELVIS WITH CONTRAST TECHNIQUE: Multidetector CT imaging of the abdomen and pelvis was performed using the standard protocol following bolus administration of intravenous contrast. CONTRAST:  29m ISOVUE-300 IOPAMIDOL (ISOVUE-300) INJECTION 61%, 1034mISOVUE-300 IOPAMIDOL (ISOVUE-300) INJECTION 61% COMPARISON:  07/29/2016 CT FINDINGS: Lower chest: Cardiomegaly with  bibasilar dependent atelectasis. No pericardial effusion. Hepatobiliary: No focal liver abnormality is seen. Subtle hypodense appearance of the liver may reflect mild fatty infiltration. No gallstones, gallbladder wall thickening, or biliary dilatation. Pancreas: Unremarkable. No pancreatic ductal dilatation or surrounding inflammatory changes. Spleen: Normal in size without focal abnormality. Adrenals/Urinary Tract: Native kidneys are atrophic in appearance. Cysts of the left kidney are stable in appearance, the largest is approximately 4.1 cm. The urinary bladder is nondistended and slightly thick-walled as a result. Left pelvic transplanted kidney demonstrates prominent vascular calcifications. Stomach/Bowel: Nondistended stomach. Normal small bowel rotation. There are mildly dilated fluid-filled distal small bowel loops with fluid within nondistended nonobstructed appearing large bowel potentially representing an adynamic ileus or enterocolitis.  Transition point is not visualized. There are loops of nondistended contracted appearing small bowel in the left hemiabdomen. Centrally within the abdomen, deep to the umbilicus is a 4.4 x 6.1 cm structure containing an air-fluid level. This likely represents a mildly dilated loop of large intestine based on what appears to be a haustral markings on the lateral view and less likely an extraluminal abscess. A follow-up CT with oral contrast may for better assessment on this matter if clinically warranted. Vascular/Lymphatic: Density aortoiliac atherosclerosis. No adenopathy. Reproductive: Prostate is mildly enlarged to 4.7 cm appear Other: No abdominal wall hernia or abnormality. No abdominopelvic ascites. Musculoskeletal: No acute or significant osseous findings. IMPRESSION: Mild to moderate fluid-filled distention of small large bowel suspicious for an enterocolitis. No focal mechanical source of obstruction is identified. Adynamic ileus is also possibility. Atrophic native kidneys with left-sided renal cysts. Transplanted left pelvic kidney with extensive vascular calcification. Cardiomegaly without acute pulmonary disease. Electronically Signed   By: Ashley Royalty M.D.   On: 09/27/2016 03:12   Dg Abd Acute W/chest  Result Date: 09/27/2016 CLINICAL DATA:  Abdominal pain, history of CHF, end-stage renal disease, hypertension, diabetes and renal transplant. EXAM: DG ABDOMEN ACUTE W/ 1V CHEST COMPARISON:  08/05/2016 CXR, CT abdomen and pelvis 118 18 FINDINGS: Stable cardiomegaly with aortic atherosclerosis. No overt pulmonary edema, effusion or pneumothorax. No pulmonary consolidation.No bowel obstruction or free air. Calcifications in the left hemipelvis associated with transplanted left kidney with atherosclerosis. No acute osseous abnormality. IMPRESSION: Stable cardiomegaly with aortic atherosclerosis. No bowel obstruction or free air. Electronically Signed   By: Ashley Royalty M.D.   On: 09/27/2016 00:01   US  Abdomen Limited Ruq  Result Date: 10/04/2016 CLINICAL DATA:  Abdominal pain for 1 day. History of renal transplant and inguinal hernia repair. History of dialysis, diabetes, hypertension, CHF. EXAM: US ABDOMEN LIMITED - RIGHT UPPER QUADRANT COMPARISON:  CT abdomen and pelvis 09/27/2016 FINDINGS: Gallbladder: No gallstones or wall thickening visualized. No sonographic Murphy sign noted by sonographer. Common bile duct: Diameter: 3.2 mm, normal Liver: No focal lesion identified. Within normal limits in parenchymal echogenicity. Incidental note of right renal atrophy. IMPRESSION: No evidence of cholelithiasis or cholecystitis. Right renal atrophy. Electronically Signed   By: Lucienne Capers M.D.   On: 10/04/2016 05:58       Filed Weights   10/05/16 2340 10/06/16 2142  Weight: 77 kg (169 lb 12.1 oz) 78.1 kg (172 lb 2.9 oz)     Microbiology: No results found for this or any previous visit (from the past 240 hour(s)).     Blood Culture    Component Value Date/Time   SDES NASAL SWAB 08/18/2015 1800   SPECREQUEST NONE 08/18/2015 1800   CULT  08/18/2015 1800    NORMAL NASOPHARYNGEAL FLORA Performed  at [place] 08/21/2015 FINAL 08/18/2015 1800      Labs: Results for orders placed or performed during the hospital encounter of 10/05/16 (from the past 48 hour(s))  HIV antibody (Routine Testing)     Status: None   Collection Time: 10/05/16  6:21 PM  Result Value Ref Range   HIV Screen 4th Generation wRfx Non Reactive Non Reactive    Comment: (NOTE) Performed At: [facility] [place], [place] [id number] Lindon Romp MD WU:[id number]   Protime-INR     Status: None   Collection Time: 10/05/16  6:21 PM  Result Value Ref Range   Prothrombin Time 14.7 11.4 - 15.2 seconds   INR 1.14   TSH     Status: None   Collection Time: 10/05/16  6:21 PM  Result Value Ref Range   TSH 1.784 0.350 - 4.500 uIU/mL    Comment: Performed by a 3rd  Generation assay with a functional sensitivity of <=0.01 uIU/mL.  Hemoglobin A1c     Status: Abnormal   Collection Time: 10/05/16  6:21 PM  Result Value Ref Range   Hgb A1c MFr Bld 4.6 (L) 4.8 - 5.6 %    Comment: (NOTE)         Pre-diabetes: 5.7 - 6.4         Diabetes: >6.4         Glycemic control for adults with diabetes: <7.0    Mean Plasma Glucose 85 mg/dL    Comment: (NOTE) Performed At: [facility] [place], [place] [id number] Lindon Romp MD [id number]   Basic metabolic panel     Status: Abnormal   Collection Time: 10/05/16  6:21 PM  Result Value Ref Range   Sodium 134 (L) 135 - 145 mmol/L   Potassium 5.0 3.5 - 5.1 mmol/L   Chloride 92 (L) 101 - 111 mmol/L   CO2 24 22 - 32 mmol/L   Glucose, Bld 88 65 - 99 mg/dL   BUN 53 (H) 6 - 20 mg/dL   Creatinine, Ser 14.13 (H) 0.61 - 1.24 mg/dL   Calcium 7.7 (L) 8.9 - 10.3 mg/dL   GFR calc non Af Amer 3 (L) >60 mL/min   GFR calc Af Amer 4 (L) >60 mL/min    Comment: (NOTE) The eGFR has been calculated using the CKD EPI equation. This calculation has not been validated in all clinical situations. eGFR's persistently <60 mL/min signify possible Chronic Kidney Disease.    Anion gap 18 (H) 5 - 15  CBC     Status: Abnormal   Collection Time: 10/05/16  6:21 PM  Result Value Ref Range   WBC 7.5 4.0 - 10.5 K/uL   RBC 4.07 (L) 4.22 - 5.81 MIL/uL   Hemoglobin 11.0 (L) 13.0 - 17.0 g/dL   HCT 35.1 (L) 39.0 - 52.0 %   MCV 86.2 78.0 - 100.0 fL   MCH 27.0 26.0 - 34.0 pg   MCHC 31.3 30.0 - 36.0 g/dL   RDW 18.0 (H) 11.5 - 15.5 %   Platelets 186 150 - 400 K/uL  Renal function panel     Status: Abnormal   Collection Time: 10/06/16  6:27 AM  Result Value Ref Range   Sodium 136 135 - 145 mmol/L   Potassium 4.0 3.5 - 5.1 mmol/L   Chloride 97 (L) 101 - 111 mmol/L   CO2 31 22 - 32 mmol/L   Glucose, Bld 71 65 - 99  mg/dL   BUN 19 6 - 20 mg/dL   Creatinine, Ser 7.69 (H) 0.61 - 1.24 mg/dL    Comment: DIALYSIS    Calcium 7.9 (L) 8.9 - 10.3 mg/dL   Phosphorus 3.6 2.5 - 4.6 mg/dL   Albumin 2.7 (L) 3.5 - 5.0 g/dL   GFR calc non Af Amer 7 (L) >60 mL/min   GFR calc Af Amer 8 (L) >60 mL/min    Comment: (NOTE) The eGFR has been calculated using the CKD EPI equation. This calculation has not been validated in all clinical situations. eGFR's persistently <60 mL/min signify possible Chronic Kidney Disease.    Anion gap 8 5 - 15  CBC     Status: Abnormal   Collection Time: 10/06/16  6:27 AM  Result Value Ref Range   WBC 4.6 4.0 - 10.5 K/uL   RBC 3.46 (L) 4.22 - 5.81 MIL/uL   Hemoglobin 9.3 (L) 13.0 - 17.0 g/dL   HCT 29.5 (L) 39.0 - 52.0 %   MCV 85.3 78.0 - 100.0 fL   MCH 26.9 26.0 - 34.0 pg   MCHC 31.5 30.0 - 36.0 g/dL   RDW 18.1 (H) 11.5 - 15.5 %   Platelets 153 150 - 400 K/uL  Renal function panel     Status: Abnormal   Collection Time: 10/07/16 11:31 AM  Result Value Ref Range   Sodium 136 135 - 145 mmol/L   Potassium 4.9 3.5 - 5.1 mmol/L    Comment: NO VISIBLE HEMOLYSIS   Chloride 96 (L) 101 - 111 mmol/L   CO2 29 22 - 32 mmol/L   Glucose, Bld 99 65 - 99 mg/dL   BUN 38 (H) 6 - 20 mg/dL   Creatinine, Ser 11.16 (H) 0.61 - 1.24 mg/dL   Calcium 7.7 (L) 8.9 - 10.3 mg/dL   Phosphorus 5.5 (H) 2.5 - 4.6 mg/dL   Albumin 2.9 (L) 3.5 - 5.0 g/dL   GFR calc non Af Amer 5 (L) >60 mL/min   GFR calc Af Amer 5 (L) >60 mL/min    Comment: (NOTE) The eGFR has been calculated using the CKD EPI equation. This calculation has not been validated in all clinical situations. eGFR's persistently <60 mL/min signify possible Chronic Kidney Disease.    Anion gap 11 5 - 15     Lipid Panel  No results found for: CHOL, TRIG, HDL, CHOLHDL, VLDL, LDLCALC, LDLDIRECT   Lab Results  Component Value Date   HGBA1C 4.6 (L) 10/05/2016   HGBA1C 5.5 08/04/2016   HGBA1C 5.4 02/11/2015      HPI :  51 year old African-American man with past medical history significant for hypertension, combined systolic/diastolic  heart failure, nonischemic cardiomyopathy, history of depression, history of DVT/PE and end-stage renal disease on hemodialysis on a Monday/Wednesday/Friday schedule at [facility], [place].  Past history punctuated by several visits to the emergency room for multiple complaints over the past 6 months and recently was seen in the emergency room and evaluated for abdominal pain with negative workup however, presented again to the [facility] emergency room earlier today with worsening abdominal pain. Radiological workup was negative for the etiology of this pain however, he was deemed to be having shortness of breath (no documented hypoxemia) and was found to have an elevated potassium of 6.2 having missed his dialysis yesterday. He states that he went to his dialysis on Wednesday and left at his dry weight complete his entire treatment.  He reports abdominal pain for the past week or so associated with nausea  and vomiting but no diarrhea. Denies any hematochezia, melena or hematemesis. Denies any fever or chills. Denies any dysuria, urgency, frequency or flank pain  HOSPITAL COURSE:    Acute on chronic abdominal pain, probable enterocolitis -Presented with nausea, vomiting and diffuse but mostly epigastric abdominal pain. -His pain is acute on chronic, been evaluated extensively before. No hematemesis or hematochezia. -CT scan abdomen and pelvis showed 4.4 x 6.1 cm air-fluid level, likely representing mildly dilated loop of large intestine less likely abscess, suspicious for enterocolitis, if symptoms persist a follow-up CT with oral contrast, may better assess patient's GI issue Initially started on imipenem, now switched to ciprofloxacin and Flagyl 7 days Patient advanced from clear liquid to solid diet , not tolerating so well    Hyperkalemia: Secondary to missed dialysis and likely dietary indiscretions. Dialyzed 3/17, 3/19  End-stage renal disease:underwent hemodialysis yesterday after  missed treatment on Friday. His next hemodialysis is scheduled for tomorrow -Nephrology consulted and followed patient during this hospitalization  Chronic back pain -Patient is on multiple pain medications for his back pain, continued Neurontin and Percocet.  Hypertension Continue home medications.  Chronic combined systolic and diastolic CHF -Secondary to nonischemic cardiomyopathy, currently on room air. -LVEF was 23% in February 2017, improved to 35-40% in April 2017. -Started home medications including Coreg, consider ACEI or ARB. We will defer to nephrology   Anemia of chronic kidney disease: Hemoglobin 2 days ago was 10.2, resume Aranesp with hemodialysis next week  Left renal mass--enlarged over the past year and is suspicious for renal cell carcinoma. Pt will likely need radical nephrectomy as the kidney is atrophic and non functioning. Patient has seen Dr. Diona Fanti  , and last follow-up to him, he is recommended to follow-up with him after discharge. MRI of the back is being performed to rule out metastasis to the back as the patient is complaining of back pain  Discharge Exam:   Blood pressure (!) 144/88, pulse 80, temperature 97.9 F (36.6 C), temperature source Oral, resp. rate 18, height 6' 2" (1.88 m), weight 78.1 kg (172 lb 2.9 oz), SpO2 95 %.   PQS:[id number] sleeping in bed CVS: Pulse regular in rate and rhythm, normal S1 and S2 Resp: Poor inspiratory effort, diminished breath sounds at the bases otherwise clear Abd: Soft, flat, minimal tenderness over epigastric area Ext: Trace lower extremity edema, left radiocephalic fistula with good thrill   Follow-up Information    Primary care provider. Call.   Why:  To make follow-up appointment       DAHLSTEDT, Lillette Boxer, MD. Call.   Specialty:  Urology Why:  To make appointment for left renal mass Contact information: [address] [place] [address] [phone]            Signed: Reyne Dumas 10/07/2016, 12:54 PM        Time spent >45 mins

## 2016-10-07 NOTE — Progress Notes (Signed)
Received notice that patient not showing on telemetry.  Patient is now refusing telemetry.  [facility] made aware.  Patient educated on the necessity of telemetry monitoring.  He is still refusing.  Will continue to monitor patient.  Earleen Reaper RN-BC, [employer]

## 2016-10-07 NOTE — Progress Notes (Signed)
Patient refusing MRSA PCR screen. Infection prevention aware.

## 2016-10-07 NOTE — Discharge Summary (Addendum)
Physician Discharge Summary  Frank [name] MRN: [id number] DOB/AGE: 1965/12/14 51 y.o.  PCP: [name] Patient   Admit date: 10/05/2016 Discharge date: 10/07/2016  Discharge Diagnoses:    Active Problems:   Anemia of chronic kidney failure   Adjustment disorder with mixed anxiety and depressed mood   Hyperkalemia   Chronic back pain   End-stage renal disease on hemodialysis (HCC)   Abdominal pain    Follow-up recommendations Follow-up with PCP in 3-5 days , including all  additional recommended appointments as below Follow-up CBC, CMP in 3-5 days Patient recommended to follow up with Franchot Gallo MD      Current Discharge Medication List    START taking these medications   Details  calcitRIOL (ROCALTROL) 0.5 MCG capsule Take 2 capsules (1 mcg total) by mouth every Monday, Wednesday, and Friday with hemodialysis. Qty: 24 capsule, Refills: 0    ciprofloxacin (CIPRO) 500 MG tablet Take 1 tablet (500 mg total) by mouth daily at 12 noon. Qty: 7 tablet, Refills: 0    !! methocarbamol (ROBAXIN) 500 MG tablet Take 1 tablet (500 mg total) by mouth every 6 (six) hours as needed for muscle spasms. Qty: 60 tablet, Refills: 0    metroNIDAZOLE (FLAGYL) 500 MG tablet Take 1 tablet (500 mg total) by mouth every 8 (eight) hours. Qty: 21 tablet, Refills: 0     !! - Potential duplicate medications found. Please discuss with provider.    CONTINUE these medications which have NOT CHANGED   Details  amLODipine (NORVASC) 5 MG tablet Take 1 tablet (5 mg total) by mouth at bedtime. Qty: 30 tablet, Refills: 1    doxazosin (CARDURA) 2 MG tablet Take 1 tablet (2 mg total) by mouth daily. Qty: 30 tablet, Refills: 1    acetaminophen-codeine (TYLENOL #3) 300-30 MG tablet Take 1 tablet by mouth every 6 (six) hours as needed. Qty: 60 tablet, Refills: 0   Associated Diagnoses: Generalized anxiety disorder    allopurinol (ZYLOPRIM) 100 MG tablet Take 100 mg by mouth daily.    ALPRAZolam  (XANAX) 1 MG tablet Take 1 tablet (1 mg total) by mouth at bedtime as needed for sleep. Qty: 6 tablet, Refills: 0    atorvastatin (LIPITOR) 40 MG tablet Take 1 tablet (40 mg total) by mouth daily at 6 PM. Qty: 30 tablet, Refills: 1    carvedilol (COREG) 25 MG tablet Take 1 tablet (25 mg total) by mouth 2 (two) times daily with a meal. Qty: 30 tablet, Refills: 1    cinacalcet (SENSIPAR) 30 MG tablet Take 30 mg by mouth every evening.     clonazePAM (KLONOPIN) 1 MG tablet Take 1 mg by mouth 2 (two) times daily.    cloNIDine (CATAPRES - DOSED IN MG/24 HR) 0.3 mg/24hr patch Place 1 patch (0.3 mg total) onto the skin every Monday. Qty: 4 patch, Refills: 1    famotidine (PEPCID) 20 MG tablet Take 1 tablet (20 mg total) by mouth 2 (two) times daily. Qty: 30 tablet, Refills: 0    gabapentin (NEURONTIN) 100 MG capsule Take 1 capsule (100 mg total) by mouth at bedtime.    isosorbide mononitrate (IMDUR) 60 MG 24 hr tablet Take 1 tablet (60 mg total) by mouth daily. Qty: 30 tablet, Refills: 1    !! methocarbamol (ROBAXIN) 500 MG tablet Take 1 tablet (500 mg total) by mouth 2 (two) times daily as needed for muscle spasms. Qty: 10 tablet, Refills: 0    omeprazole (PRILOSEC) 20 MG capsule Take  1 capsule (20 mg total) by mouth daily. Qty: 30 capsule, Refills: 1    ondansetron (ZOFRAN ODT) 4 MG disintegrating tablet Take 1 tablet (4 mg total) by mouth every 8 (eight) hours as needed for nausea or vomiting. Qty: 10 tablet, Refills: 0    senna-docusate (SENOKOT-S) 8.6-50 MG tablet Take 1 tablet by mouth at bedtime as needed for mild constipation. Qty: 30 tablet, Refills: 0    sevelamer carbonate (RENVELA) 800 MG tablet Take 1,600 mg by mouth 3 (three) times daily with meals.      !! - Potential duplicate medications found. Please discuss with provider.    STOP taking these medications     oxyCODONE-acetaminophen (PERCOCET/ROXICET) 5-325 MG tablet           Discharge Condition:   Stable   Discharge Instructions Get Medicines reviewed and adjusted: Please take all your medications with you for your next visit with your Primary MD  Please request your Primary MD to go over all hospital tests and procedure/radiological results at the follow up, please ask your Primary MD to get all Hospital records sent to his/her office.  If you experience worsening of your admission symptoms, develop shortness of breath, life threatening emergency, suicidal or homicidal thoughts you must seek medical attention immediately by calling 911 or calling your MD immediately if symptoms less severe.  You must read complete instructions/literature along with all the possible adverse reactions/side effects for all the Medicines you take and that have been prescribed to you. Take any new Medicines after you have completely understood and accpet all the possible adverse reactions/side effects.   Do not drive when taking Pain medications.   Do not take more than prescribed Pain, Sleep and Anxiety Medications  Special Instructions: If you have smoked or chewed Tobacco in the last 2 yrs please stop smoking, stop any regular Alcohol and or any Recreational drug use.  Wear Seat belts while driving.  Please note  You were cared for by a hospitalist during your hospital stay. Once you are discharged, your primary care physician will handle any further medical issues. Please note that NO REFILLS for any discharge medications will be authorized once you are discharged, as it is imperative that you return to your primary care physician (or establish a relationship with a primary care physician if you do not have one) for your aftercare needs so that they can reassess your need for medications and monitor your lab values.     Allergies  Allergen Reactions  . Butalbital-Apap-Caffeine Shortness Of Breath, Swelling and Other (See Comments)    Swelling in throat  . Ferrlecit [Na Ferric Gluc Cplx In  Sucrose] Shortness Of Breath, Swelling and Other (See Comments)    Swelling in throat, tolerates Venofor  . Minoxidil Shortness Of Breath  . Tylenol [Acetaminophen] Anaphylaxis and Swelling  . Darvocet [Propoxyphene N-Acetaminophen] Hives      Disposition: 01-Home or Self Care   Consults: * Nephrology  Significant Diagnostic Studies:  Ct Abdomen Pelvis W Contrast  Result Date: 09/27/2016 CLINICAL DATA:  Epigastric pain and vomiting EXAM: CT ABDOMEN AND PELVIS WITH CONTRAST TECHNIQUE: Multidetector CT imaging of the abdomen and pelvis was performed using the standard protocol following bolus administration of intravenous contrast. CONTRAST:  27m ISOVUE-300 IOPAMIDOL (ISOVUE-300) INJECTION 61%, 1065mISOVUE-300 IOPAMIDOL (ISOVUE-300) INJECTION 61% COMPARISON:  07/29/2016 CT FINDINGS: Lower chest: Cardiomegaly with bibasilar dependent atelectasis. No pericardial effusion. Hepatobiliary: No focal liver abnormality is seen. Subtle hypodense appearance of the liver may reflect  mild fatty infiltration. No gallstones, gallbladder wall thickening, or biliary dilatation. Pancreas: Unremarkable. No pancreatic ductal dilatation or surrounding inflammatory changes. Spleen: Normal in size without focal abnormality. Adrenals/Urinary Tract: Native kidneys are atrophic in appearance. Cysts of the left kidney are stable in appearance, the largest is approximately 4.1 cm. The urinary bladder is nondistended and slightly thick-walled as a result. Left pelvic transplanted kidney demonstrates prominent vascular calcifications. Stomach/Bowel: Nondistended stomach. Normal small bowel rotation. There are mildly dilated fluid-filled distal small bowel loops with fluid within nondistended nonobstructed appearing large bowel potentially representing an adynamic ileus or enterocolitis. Transition point is not visualized. There are loops of nondistended contracted appearing small bowel in the left hemiabdomen. Centrally within  the abdomen, deep to the umbilicus is a 4.4 x 6.1 cm structure containing an air-fluid level. This likely represents a mildly dilated loop of large intestine based on what appears to be a haustral markings on the lateral view and less likely an extraluminal abscess. A follow-up CT with oral contrast may for better assessment on this matter if clinically warranted. Vascular/Lymphatic: Density aortoiliac atherosclerosis. No adenopathy. Reproductive: Prostate is mildly enlarged to 4.7 cm appear Other: No abdominal wall hernia or abnormality. No abdominopelvic ascites. Musculoskeletal: No acute or significant osseous findings. IMPRESSION: Mild to moderate fluid-filled distention of small large bowel suspicious for an enterocolitis. No focal mechanical source of obstruction is identified. Adynamic ileus is also possibility. Atrophic native kidneys with left-sided renal cysts. Transplanted left pelvic kidney with extensive vascular calcification. Cardiomegaly without acute pulmonary disease. Electronically Signed   By: Ashley Royalty M.D.   On: 09/27/2016 03:12   Dg Abd Acute W/chest  Result Date: 09/27/2016 CLINICAL DATA:  Abdominal pain, history of CHF, end-stage renal disease, hypertension, diabetes and renal transplant. EXAM: DG ABDOMEN ACUTE W/ 1V CHEST COMPARISON:  08/05/2016 CXR, CT abdomen and pelvis 118 18 FINDINGS: Stable cardiomegaly with aortic atherosclerosis. No overt pulmonary edema, effusion or pneumothorax. No pulmonary consolidation.No bowel obstruction or free air. Calcifications in the left hemipelvis associated with transplanted left kidney with atherosclerosis. No acute osseous abnormality. IMPRESSION: Stable cardiomegaly with aortic atherosclerosis. No bowel obstruction or free air. Electronically Signed   By: Ashley Royalty M.D.   On: 09/27/2016 00:01   US Abdomen Limited Ruq  Result Date: 10/04/2016 CLINICAL DATA:  Abdominal pain for 1 day. History of renal transplant and inguinal hernia repair.  History of dialysis, diabetes, hypertension, CHF. EXAM: US ABDOMEN LIMITED - RIGHT UPPER QUADRANT COMPARISON:  CT abdomen and pelvis 09/27/2016 FINDINGS: Gallbladder: No gallstones or wall thickening visualized. No sonographic Murphy sign noted by sonographer. Common bile duct: Diameter: 3.2 mm, normal Liver: No focal lesion identified. Within normal limits in parenchymal echogenicity. Incidental note of right renal atrophy. IMPRESSION: No evidence of cholelithiasis or cholecystitis. Right renal atrophy. Electronically Signed   By: Lucienne Capers M.D.   On: 10/04/2016 05:58       Filed Weights   10/05/16 2340 10/06/16 2142  Weight: 77 kg (169 lb 12.1 oz) 78.1 kg (172 lb 2.9 oz)     Microbiology: No results found for this or any previous visit (from the past 240 hour(s)).     Blood Culture    Component Value Date/Time   SDES NASAL SWAB 08/18/2015 1800   SPECREQUEST NONE 08/18/2015 1800   CULT  08/18/2015 1800    NORMAL NASOPHARYNGEAL FLORA Performed at [facility] 08/21/2015 FINAL 08/18/2015 1800      Labs: Results for orders  placed or performed during the hospital encounter of 10/05/16 (from the past 48 hour(s))  HIV antibody (Routine Testing)     Status: None   Collection Time: 10/05/16  6:21 PM  Result Value Ref Range   HIV Screen 4th Generation wRfx Non Reactive Non Reactive    Comment: (NOTE) Performed At: [facility] [address] [place], [place] [id number] Lindon Romp MD WU:[id number]   Protime-INR     Status: None   Collection Time: 10/05/16  6:21 PM  Result Value Ref Range   Prothrombin Time 14.7 11.4 - 15.2 seconds   INR 1.14   TSH     Status: None   Collection Time: 10/05/16  6:21 PM  Result Value Ref Range   TSH 1.784 0.350 - 4.500 uIU/mL    Comment: Performed by a 3rd Generation assay with a functional sensitivity of <=0.01 uIU/mL.  Hemoglobin A1c     Status: Abnormal   Collection Time: 10/05/16  6:21 PM  Result  Value Ref Range   Hgb A1c MFr Bld 4.6 (L) 4.8 - 5.6 %    Comment: (NOTE)         Pre-diabetes: 5.7 - 6.4         Diabetes: >6.4         Glycemic control for adults with diabetes: <7.0    Mean Plasma Glucose 85 mg/dL    Comment: (NOTE) Performed At: [facility] [place], [place] [id number] Lindon Romp MD OS:[id number]   Basic metabolic panel     Status: Abnormal   Collection Time: 10/05/16  6:21 PM  Result Value Ref Range   Sodium 134 (L) 135 - 145 mmol/L   Potassium 5.0 3.5 - 5.1 mmol/L   Chloride 92 (L) 101 - 111 mmol/L   CO2 24 22 - 32 mmol/L   Glucose, Bld 88 65 - 99 mg/dL   BUN 53 (H) 6 - 20 mg/dL   Creatinine, Ser 14.13 (H) 0.61 - 1.24 mg/dL   Calcium 7.7 (L) 8.9 - 10.3 mg/dL   GFR calc non Af Amer 3 (L) >60 mL/min   GFR calc Af Amer 4 (L) >60 mL/min    Comment: (NOTE) The eGFR has been calculated using the CKD EPI equation. This calculation has not been validated in all clinical situations. eGFR's persistently <60 mL/min signify possible Chronic Kidney Disease.    Anion gap 18 (H) 5 - 15  CBC     Status: Abnormal   Collection Time: 10/05/16  6:21 PM  Result Value Ref Range   WBC 7.5 4.0 - 10.5 K/uL   RBC 4.07 (L) 4.22 - 5.81 MIL/uL   Hemoglobin 11.0 (L) 13.0 - 17.0 g/dL   HCT 35.1 (L) 39.0 - 52.0 %   MCV 86.2 78.0 - 100.0 fL   MCH 27.0 26.0 - 34.0 pg   MCHC 31.3 30.0 - 36.0 g/dL   RDW 18.0 (H) 11.5 - 15.5 %   Platelets 186 150 - 400 K/uL  Renal function panel     Status: Abnormal   Collection Time: 10/06/16  6:27 AM  Result Value Ref Range   Sodium 136 135 - 145 mmol/L   Potassium 4.0 3.5 - 5.1 mmol/L   Chloride 97 (L) 101 - 111 mmol/L   CO2 31 22 - 32 mmol/L   Glucose, Bld 71 65 - 99 mg/dL   BUN 19 6 - 20 mg/dL   Creatinine, Ser 7.69 (H) 0.61 - 1.24 mg/dL  Comment: DIALYSIS   Calcium 7.9 (L) 8.9 - 10.3 mg/dL   Phosphorus 3.6 2.5 - 4.6 mg/dL   Albumin 2.7 (L) 3.5 - 5.0 g/dL   GFR calc non Af Amer 7 (L) >60 mL/min   GFR  calc Af Amer 8 (L) >60 mL/min    Comment: (NOTE) The eGFR has been calculated using the CKD EPI equation. This calculation has not been validated in all clinical situations. eGFR's persistently <60 mL/min signify possible Chronic Kidney Disease.    Anion gap 8 5 - 15  CBC     Status: Abnormal   Collection Time: 10/06/16  6:27 AM  Result Value Ref Range   WBC 4.6 4.0 - 10.5 K/uL   RBC 3.46 (L) 4.22 - 5.81 MIL/uL   Hemoglobin 9.3 (L) 13.0 - 17.0 g/dL   HCT 29.5 (L) 39.0 - 52.0 %   MCV 85.3 78.0 - 100.0 fL   MCH 26.9 26.0 - 34.0 pg   MCHC 31.5 30.0 - 36.0 g/dL   RDW 18.1 (H) 11.5 - 15.5 %   Platelets 153 150 - 400 K/uL     Lipid Panel  No results found for: CHOL, TRIG, HDL, CHOLHDL, VLDL, LDLCALC, LDLDIRECT   Lab Results  Component Value Date   HGBA1C 4.6 (L) 10/05/2016   HGBA1C 5.5 08/04/2016   HGBA1C 5.4 02/11/2015      HPI :  51 year old African-American man with past medical history significant for hypertension, combined systolic/diastolic heart failure, nonischemic cardiomyopathy, history of depression, history of DVT/PE and end-stage renal disease on hemodialysis on a Monday/Wednesday/Friday schedule at [facility], [place].  Past history punctuated by several visits to the emergency room for multiple complaints over the past 6 months and recently was seen in the emergency room and evaluated for abdominal pain with negative workup however, presented again to the [facility] emergency room earlier today with worsening abdominal pain. Radiological workup was negative for the etiology of this pain however, he was deemed to be having shortness of breath (no documented hypoxemia) and was found to have an elevated potassium of 6.2 having missed his dialysis yesterday. He states that he went to his dialysis on Wednesday and left at his dry weight complete his entire treatment.  He reports abdominal pain for the past week or so associated with nausea and vomiting but no diarrhea.  Denies any hematochezia, melena or hematemesis. Denies any fever or chills. Denies any dysuria, urgency, frequency or flank pain  HOSPITAL COURSE:    Acute on chronic abdominal pain, probable enterocolitis -Presented with nausea, vomiting and diffuse but mostly epigastric abdominal pain. -His pain is acute on chronic, been evaluated extensively before. No hematemesis or hematochezia. -CT scan abdomen and pelvis showed 4.4 x 6.1 cm air-fluid level, likely representing mildly dilated loop of large intestine less likely abscess, suspicious for enterocolitis, if symptoms persist a follow-up CT with oral contrast, may better assess patient's GI issue Initially started on imipenem, now switched to ciprofloxacin and Flagyl 7 days Patient advanced from clear liquid to solid diet which he tolerated well    Hyperkalemia: Secondary to missed dialysis and likely dietary indiscretions. Dialyzed 3/17, 3/19  End-stage renal disease:underwent hemodialysis yesterday after missed treatment on Friday. His next hemodialysis is scheduled for tomorrow -Nephrology consulted and followed patient during this hospitalization  Chronic back pain -Patient is on multiple pain medications for his back pain, continued Neurontin and Percocet.  Hypertension Continue home medications.  Chronic combined systolic and diastolic CHF -Secondary to nonischemic cardiomyopathy, currently  on room air. -LVEF was 23% in February 2017, improved to 35-40% in April 2017. -Started home medications including Coreg, consider ACEI or ARB. We will defer to nephrology   Anemia of chronic kidney disease: Hemoglobin 2 days ago was 10.2, resume Aranesp with hemodialysis next week  Left renal mass--enlarged over the past year and is suspicious for renal cell carcinoma. Pt will likely need radical nephrectomy as the kidney is atrophic and non functioning. Patient has seen Dr. Diona Fanti  , and last follow-up to him, he is recommended  to follow-up with him after discharge. MRI of the back is being performed to rule out metastasis to the back as the patient is complaining of back pain  Discharge Exam:   Blood pressure (!) 155/86, pulse 72, temperature 97.7 F (36.5 C), temperature source Oral, resp. rate 18, height 6' 2" (1.88 m), weight 78.1 kg (172 lb 2.9 oz), SpO2 95 %.   IJF:[id number] sleeping in bed CVS: Pulse regular in rate and rhythm, normal S1 and S2 Resp: Poor inspiratory effort, diminished breath sounds at the bases otherwise clear Abd: Soft, flat, minimal tenderness over epigastric area Ext: Trace lower extremity edema, left radiocephalic fistula with good thrill   Follow-up Information    Primary care provider. Call.   Why:  To make follow-up appointment          Signed: Kathlene Yano 10/07/2016, 9:21 AM        Time spent >45 mins

## 2016-10-07 NOTE — Progress Notes (Signed)
Pharmacy Antibiotic Note  Frank [name] is a 51 y.o. male admitted on 10/05/2016 with intra-abdominal infection.  Pharmacy has been consulted for ciprofloxacin and Flagyl dosing.  Plan: Ciprofloxacin 535m PO q24h Flagyl 5051mPO TID is appropriate for ESRD patients  Height: 6' 2" (188 cm) Weight: 172 lb 2.9 oz (78.1 kg) IBW/kg (Calculated) : 82.2  Temp (24hrs), Avg:98.1 F (36.7 C), Min:97.7 F (36.5 C), Max:98.4 F (36.9 C)   Recent Labs Lab 10/03/16 2229 10/05/16 1821 10/06/16 0627  WBC 5.0 7.5 4.6  CREATININE 11.11* 14.13* 7.69*    Estimated Creatinine Clearance: 12.4 mL/min (A) (by C-G formula based on SCr of 7.69 mg/dL (H)).    Allergies  Allergen Reactions  . Butalbital-Apap-Caffeine Shortness Of Breath, Swelling and Other (See Comments)    Swelling in throat  . Ferrlecit [Na Ferric Gluc Cplx In Sucrose] Shortness Of Breath, Swelling and Other (See Comments)    Swelling in throat, tolerates Venofor  . Minoxidil Shortness Of Breath  . Tylenol [Acetaminophen] Anaphylaxis and Swelling  . Darvocet [Propoxyphene N-Acetaminophen] Hives      Thank you for allowing pharmacy to be a part of this patient's care.  Rakesha Dalporto 10/07/2016 9:31 AM

## 2016-10-07 NOTE — Progress Notes (Signed)
Patient still refusing telemetry monitoring so RN notified telemetry once more and placed discontinue telemetry order, as care order described by Dr Allyson Sabal

## 2016-10-07 NOTE — Progress Notes (Signed)
RN notified by CT that patient has a STAT order for CT of abdomen. RN explained that patient was in MRI at the moment and was going to dialysis afterward. CT stated for RN to notify them when patient returned to room as he would have to drink oral contrast. Dr Allyson Sabal notified and stated "that is fine. Just whenever they can get to him."

## 2016-10-08 ENCOUNTER — Inpatient Hospital Stay (HOSPITAL_COMMUNITY): Payer: Medicare Other

## 2016-10-08 DIAGNOSIS — N189 Chronic kidney disease, unspecified: Secondary | ICD-10-CM

## 2016-10-08 LAB — RENAL FUNCTION PANEL
ALBUMIN: 2.8 g/dL — AB (ref 3.5–5.0)
ANION GAP: 11 (ref 5–15)
BUN: 16 mg/dL (ref 6–20)
CO2: 28 mmol/L (ref 22–32)
Calcium: 7.8 mg/dL — ABNORMAL LOW (ref 8.9–10.3)
Chloride: 95 mmol/L — ABNORMAL LOW (ref 101–111)
Creatinine, Ser: 6.59 mg/dL — ABNORMAL HIGH (ref 0.61–1.24)
GFR, EST AFRICAN AMERICAN: 10 mL/min — AB (ref >60)
GFR, EST NON AFRICAN AMERICAN: 9 mL/min — AB (ref >60)
Glucose, Bld: 68 mg/dL (ref 65–99)
PHOSPHORUS: 4.4 mg/dL (ref 2.5–4.6)
POTASSIUM: 5 mmol/L (ref 3.5–5.1)
Sodium: 134 mmol/L — ABNORMAL LOW (ref 135–145)

## 2016-10-08 NOTE — Progress Notes (Signed)
Pt discharge papers given, pt advised medications at drug store.  Pt cab voucher given, pt escorted out ambulating accompanied by security.

## 2016-10-08 NOTE — Progress Notes (Signed)
Pt has bed raised all the way up.  I asked pt if I could lower the bed so that he would not get hurt if he fell or forgot his bed was up and tried to get up.  Pt refused to let me lower the bed.  Will continue to provide education.   I also advised pt about his CT order which could not be carried out during the day because he was in HD.  When I explained to pt that he was going to have to drink the contrast, he stated "I will not drink the contrast. They will give it to me through my IV or I do not want it".  Raliegh Ip Schorr notified.

## 2016-10-08 NOTE — Discharge Summary (Signed)
Physician Discharge Summary  Frank [name] MRN: [id number] DOB/AGE: [birth year] 51 y.o.  PCP: [name] Patient   Admit date: 10/05/2016 Discharge date: 10/08/2016  Discharge Diagnoses:    Active Problems:   Anemia of chronic kidney failure   Adjustment disorder with mixed anxiety and depressed mood   Hyperkalemia   Chronic back pain   End-stage renal disease on hemodialysis ([facility])   Abdominal pain    Follow-up recommendations Follow-up with PCP in 3-5 days , including all  additional recommended appointments as below Follow-up CBC, CMP in 3-5 days Patient recommended to follow up with Franchot Gallo MD     Allergies as of 10/08/2016      Reactions   Butalbital-apap-caffeine Shortness Of Breath, Swelling, Other (See Comments)   Swelling in throat   Ferrlecit [na Ferric Gluc Cplx In Sucrose] Shortness Of Breath, Swelling, Other (See Comments)   Swelling in throat, tolerates Venofor   Minoxidil Shortness Of Breath   Tylenol [acetaminophen] Anaphylaxis, Swelling   Darvocet [propoxyphene N-acetaminophen] Hives      Medication List    TAKE these medications   acetaminophen-codeine 300-30 MG tablet Commonly known as:  TYLENOL #3 Take 1 tablet by mouth every 6 (six) hours as needed.   allopurinol 100 MG tablet Commonly known as:  ZYLOPRIM Take 100 mg by mouth daily.   ALPRAZolam 1 MG tablet Commonly known as:  XANAX Take 1 tablet (1 mg total) by mouth at bedtime as needed for sleep.   amLODipine 5 MG tablet Commonly known as:  NORVASC Take 1 tablet (5 mg total) by mouth at bedtime.   atorvastatin 40 MG tablet Commonly known as:  LIPITOR Take 1 tablet (40 mg total) by mouth daily at 6 PM.   calcitRIOL 0.5 MCG capsule Commonly known as:  ROCALTROL Take 2 capsules (1 mcg total) by mouth every Monday, Wednesday, and Friday with hemodialysis.   carvedilol 25 MG tablet Commonly known as:  COREG Take 1 tablet (25 mg total) by mouth 2 (two) times daily with a meal.    cinacalcet 30 MG tablet Commonly known as:  SENSIPAR Take 30 mg by mouth every evening.   ciprofloxacin 500 MG tablet Commonly known as:  CIPRO Take 1 tablet (500 mg total) by mouth daily at 12 noon.   clonazePAM 1 MG tablet Commonly known as:  KLONOPIN Take 1 mg by mouth 2 (two) times daily.   cloNIDine 0.3 mg/24hr patch Commonly known as:  CATAPRES - Dosed in mg/24 hr Place 1 patch (0.3 mg total) onto the skin every Monday.   doxazosin 2 MG tablet Commonly known as:  CARDURA Take 1 tablet (2 mg total) by mouth daily.   famotidine 20 MG tablet Commonly known as:  PEPCID Take 1 tablet (20 mg total) by mouth 2 (two) times daily.   gabapentin 100 MG capsule Commonly known as:  NEURONTIN Take 1 capsule (100 mg total) by mouth at bedtime.   isosorbide mononitrate 60 MG 24 hr tablet Commonly known as:  IMDUR Take 1 tablet (60 mg total) by mouth daily.   methocarbamol 500 MG tablet Commonly known as:  ROBAXIN Take 1 tablet (500 mg total) by mouth 2 (two) times daily as needed for muscle spasms. What changed:  Another medication with the same name was added. Make sure you understand how and when to take each.      metroNIDAZOLE 500 MG tablet Commonly known as:  FLAGYL Take 1 tablet (500 mg total) by mouth every 8 (  eight) hours.   omeprazole 20 MG capsule Commonly known as:  PRILOSEC Take 1 capsule (20 mg total) by mouth daily.   ondansetron 4 MG disintegrating tablet Commonly known as:  ZOFRAN ODT Take 1 tablet (4 mg total) by mouth every 8 (eight) hours as needed for nausea or vomiting.   senna-docusate 8.6-50 MG tablet Commonly known as:  Senokot-S Take 1 tablet by mouth at bedtime as needed for mild constipation.   sevelamer carbonate 800 MG tablet Commonly known as:  RENVELA Take 1,600 mg by mouth 3 (three) times daily with meals.       Discharge Condition:  Stable   Discharge Instructions Get Medicines reviewed and adjusted: Please take all your  medications with you for your next visit with your Primary MD  Please request your Primary MD to go over all hospital tests and procedure/radiological results at the follow up, please ask your Primary MD to get all Hospital records sent to his/her office.  If you experience worsening of your admission symptoms, develop shortness of breath, life threatening emergency, suicidal or homicidal thoughts you must seek medical attention immediately by calling 911 or calling your MD immediately if symptoms less severe.  You must read complete instructions/literature along with all the possible adverse reactions/side effects for all the Medicines you take and that have been prescribed to you. Take any new Medicines after you have completely understood and accpet all the possible adverse reactions/side effects.   Do not drive when taking Pain medications.   Do not take more than prescribed Pain, Sleep and Anxiety Medications  Special Instructions: If you have smoked or chewed Tobacco in the last 2 yrs please stop smoking, stop any regular Alcohol and or any Recreational drug use.  Wear Seat belts while driving.  Please note  You were cared for by a hospitalist during your hospital stay. Once you are discharged, your primary care physician will handle any further medical issues. Please note that NO REFILLS for any discharge medications will be authorized once you are discharged, as it is imperative that you return to your primary care physician (or establish a relationship with a primary care physician if you do not have one) for your aftercare needs so that they can reassess your need for medications and monitor your lab values.     Allergies  Allergen Reactions  . Butalbital-Apap-Caffeine Shortness Of Breath, Swelling and Other (See Comments)    Swelling in throat  . Ferrlecit [Na Ferric Gluc Cplx In Sucrose] Shortness Of Breath, Swelling and Other (See Comments)    Swelling in throat, tolerates  Venofor  . Minoxidil Shortness Of Breath  . Tylenol [Acetaminophen] Anaphylaxis and Swelling  . Darvocet [Propoxyphene N-Acetaminophen] Hives      Disposition: 01-Home or Self Care   Consults: * Nephrology  Significant Diagnostic Studies:  Mr Thoracic Spine Wo Contrast  Result Date: 10/07/2016 CLINICAL DATA:  Severe back pain and leg pain. Kidney mass found 1 year ago. EXAM: MRI THORACIC AND LUMBAR SPINE WITHOUT CONTRAST TECHNIQUE: Multiplanar and multiecho pulse sequences of the thoracic and lumbar spine were obtained without intravenous contrast. COMPARISON:  None. FINDINGS: MRI THORACIC SPINE FINDINGS Alignment:  Physiologic. Vertebrae: No fracture, evidence of discitis, or bone lesion. Decreased marrow signal as can be seen with renal osteodystrophy. Cord:  Normal signal and morphology. Paraspinal and other soft tissues: Negative. Disc levels: Disc spaces:  Disc spaces are maintained. T1-T2: No disc protrusion, foraminal stenosis or central canal stenosis. T2-T3: No disc protrusion, foraminal  stenosis or central canal stenosis. T3-T4: No disc protrusion, foraminal stenosis or central canal stenosis. T4-T5: No disc protrusion, foraminal stenosis or central canal stenosis. T5-T6: No disc protrusion, foraminal stenosis or central canal stenosis. T6-T7: No disc protrusion, foraminal stenosis or central canal stenosis. T7-T8: No disc protrusion, foraminal stenosis or central canal stenosis. T8-T9: No disc protrusion, foraminal stenosis or central canal stenosis. T9-T10: No disc protrusion, foraminal stenosis or central canal stenosis. T10-T11: No disc protrusion, foraminal stenosis or central canal stenosis. T11-T12: No disc protrusion, foraminal stenosis or central canal stenosis. MRI LUMBAR SPINE FINDINGS Segmentation:  Standard. Alignment:  Physiologic. Vertebrae: No fracture, evidence of discitis, or bone lesion. Decreased marrow signal as can be seen with renal osteodystrophy. Conus medullaris:  Extends to the L1 level and appears normal. Paraspinal and other soft tissues: No paraspinal abnormality. Disc levels: Disc spaces: Disc spaces are maintained. T12-L1: No significant disc bulge. No evidence of neural foraminal stenosis. No central canal stenosis. L1-L2: No significant disc bulge. No evidence of neural foraminal stenosis. No central canal stenosis. L2-L3: No significant disc bulge. No evidence of neural foraminal stenosis. No central canal stenosis. L3-L4: No significant disc bulge. No evidence of neural foraminal stenosis. No central canal stenosis. L4-L5: No significant disc bulge. No evidence of neural foraminal stenosis. No central canal stenosis. L5-S1: No significant disc bulge. No evidence of neural foraminal stenosis. No central canal stenosis. IMPRESSION: MR THORACIC SPINE IMPRESSION 1. No acute injury of the thoracic spine. 2. No evidence of metastatic disease. MR LUMBAR SPINE IMPRESSION 1. No acute injury of the lumbar spine. 2. No evidence of metastatic disease. Electronically Signed   By: Kathreen Devoid   On: 10/07/2016 14:11   Mr Lumbar Spine Wo Contrast  Result Date: 10/07/2016 CLINICAL DATA:  Severe back pain and leg pain. Kidney mass found 1 year ago. EXAM: MRI THORACIC AND LUMBAR SPINE WITHOUT CONTRAST TECHNIQUE: Multiplanar and multiecho pulse sequences of the thoracic and lumbar spine were obtained without intravenous contrast. COMPARISON:  None. FINDINGS: MRI THORACIC SPINE FINDINGS Alignment:  Physiologic. Vertebrae: No fracture, evidence of discitis, or bone lesion. Decreased marrow signal as can be seen with renal osteodystrophy. Cord:  Normal signal and morphology. Paraspinal and other soft tissues: Negative. Disc levels: Disc spaces:  Disc spaces are maintained. T1-T2: No disc protrusion, foraminal stenosis or central canal stenosis. T2-T3: No disc protrusion, foraminal stenosis or central canal stenosis. T3-T4: No disc protrusion, foraminal stenosis or central canal  stenosis. T4-T5: No disc protrusion, foraminal stenosis or central canal stenosis. T5-T6: No disc protrusion, foraminal stenosis or central canal stenosis. T6-T7: No disc protrusion, foraminal stenosis or central canal stenosis. T7-T8: No disc protrusion, foraminal stenosis or central canal stenosis. T8-T9: No disc protrusion, foraminal stenosis or central canal stenosis. T9-T10: No disc protrusion, foraminal stenosis or central canal stenosis. T10-T11: No disc protrusion, foraminal stenosis or central canal stenosis. T11-T12: No disc protrusion, foraminal stenosis or central canal stenosis. MRI LUMBAR SPINE FINDINGS Segmentation:  Standard. Alignment:  Physiologic. Vertebrae: No fracture, evidence of discitis, or bone lesion. Decreased marrow signal as can be seen with renal osteodystrophy. Conus medullaris: Extends to the L1 level and appears normal. Paraspinal and other soft tissues: No paraspinal abnormality. Disc levels: Disc spaces: Disc spaces are maintained. T12-L1: No significant disc bulge. No evidence of neural foraminal stenosis. No central canal stenosis. L1-L2: No significant disc bulge. No evidence of neural foraminal stenosis. No central canal stenosis. L2-L3: No significant disc bulge. No evidence of neural foraminal stenosis.  No central canal stenosis. L3-L4: No significant disc bulge. No evidence of neural foraminal stenosis. No central canal stenosis. L4-L5: No significant disc bulge. No evidence of neural foraminal stenosis. No central canal stenosis. L5-S1: No significant disc bulge. No evidence of neural foraminal stenosis. No central canal stenosis. IMPRESSION: MR THORACIC SPINE IMPRESSION 1. No acute injury of the thoracic spine. 2. No evidence of metastatic disease. MR LUMBAR SPINE IMPRESSION 1. No acute injury of the lumbar spine. 2. No evidence of metastatic disease. Electronically Signed   By: Kathreen Devoid   On: 10/07/2016 14:11   Ct Abdomen Pelvis W Contrast  Result Date:  09/27/2016 CLINICAL DATA:  Epigastric pain and vomiting EXAM: CT ABDOMEN AND PELVIS WITH CONTRAST TECHNIQUE: Multidetector CT imaging of the abdomen and pelvis was performed using the standard protocol following bolus administration of intravenous contrast. CONTRAST:  74m ISOVUE-300 IOPAMIDOL (ISOVUE-300) INJECTION 61%, 1077mISOVUE-300 IOPAMIDOL (ISOVUE-300) INJECTION 61% COMPARISON:  07/29/2016 CT FINDINGS: Lower chest: Cardiomegaly with bibasilar dependent atelectasis. No pericardial effusion. Hepatobiliary: No focal liver abnormality is seen. Subtle hypodense appearance of the liver may reflect mild fatty infiltration. No gallstones, gallbladder wall thickening, or biliary dilatation. Pancreas: Unremarkable. No pancreatic ductal dilatation or surrounding inflammatory changes. Spleen: Normal in size without focal abnormality. Adrenals/Urinary Tract: Native kidneys are atrophic in appearance. Cysts of the left kidney are stable in appearance, the largest is approximately 4.1 cm. The urinary bladder is nondistended and slightly thick-walled as a result. Left pelvic transplanted kidney demonstrates prominent vascular calcifications. Stomach/Bowel: Nondistended stomach. Normal small bowel rotation. There are mildly dilated fluid-filled distal small bowel loops with fluid within nondistended nonobstructed appearing large bowel potentially representing an adynamic ileus or enterocolitis. Transition point is not visualized. There are loops of nondistended contracted appearing small bowel in the left hemiabdomen. Centrally within the abdomen, deep to the umbilicus is a 4.4 x 6.1 cm structure containing an air-fluid level. This likely represents a mildly dilated loop of large intestine based on what appears to be a haustral markings on the lateral view and less likely an extraluminal abscess. A follow-up CT with oral contrast may for better assessment on this matter if clinically warranted. Vascular/Lymphatic: Density  aortoiliac atherosclerosis. No adenopathy. Reproductive: Prostate is mildly enlarged to 4.7 cm appear Other: No abdominal wall hernia or abnormality. No abdominopelvic ascites. Musculoskeletal: No acute or significant osseous findings. IMPRESSION: Mild to moderate fluid-filled distention of small large bowel suspicious for an enterocolitis. No focal mechanical source of obstruction is identified. Adynamic ileus is also possibility. Atrophic native kidneys with left-sided renal cysts. Transplanted left pelvic kidney with extensive vascular calcification. Cardiomegaly without acute pulmonary disease. Electronically Signed   By: DaAshley Royalty.D.   On: 09/27/2016 03:12   Dg Abd Acute W/chest  Result Date: 09/27/2016 CLINICAL DATA:  Abdominal pain, history of CHF, end-stage renal disease, hypertension, diabetes and renal transplant. EXAM: DG ABDOMEN ACUTE W/ 1V CHEST COMPARISON:  08/05/2016 CXR, CT abdomen and pelvis 118 18 FINDINGS: Stable cardiomegaly with aortic atherosclerosis. No overt pulmonary edema, effusion or pneumothorax. No pulmonary consolidation.No bowel obstruction or free air. Calcifications in the left hemipelvis associated with transplanted left kidney with atherosclerosis. No acute osseous abnormality. IMPRESSION: Stable cardiomegaly with aortic atherosclerosis. No bowel obstruction or free air. Electronically Signed   By: DaAshley Royalty.D.   On: 09/27/2016 00:01   UsKoreabdomen Limited Ruq  Result Date: 10/04/2016 CLINICAL DATA:  Abdominal pain for 1 day. History of renal transplant and inguinal hernia repair. History  of dialysis, diabetes, hypertension, CHF. EXAM: US ABDOMEN LIMITED - RIGHT UPPER QUADRANT COMPARISON:  CT abdomen and pelvis 09/27/2016 FINDINGS: Gallbladder: No gallstones or wall thickening visualized. No sonographic Murphy sign noted by sonographer. Common bile duct: Diameter: 3.2 mm, normal Liver: No focal lesion identified. Within normal limits in parenchymal echogenicity.  Incidental note of right renal atrophy. IMPRESSION: No evidence of cholelithiasis or cholecystitis. Right renal atrophy. Electronically Signed   By: Lucienne Capers M.D.   On: 10/04/2016 05:58       Filed Weights   10/06/16 2142 10/07/16 1850 10/07/16 2254  Weight: 78.1 kg (172 lb 2.9 oz) 77.5 kg (170 lb 13.7 oz) 75.3 kg (166 lb 0.1 oz)     Microbiology: No results found for this or any previous visit (from the past 240 hour(s)).     Blood Culture    Component Value Date/Time   SDES NASAL SWAB 08/18/2015 1800   SPECREQUEST NONE 08/18/2015 1800   CULT  08/18/2015 1800    NORMAL NASOPHARYNGEAL FLORA Performed at [place] 08/21/2015 FINAL 08/18/2015 1800      Labs: Results for orders placed or performed during the hospital encounter of 10/05/16 (from the past 48 hour(s))  Renal function panel     Status: Abnormal   Collection Time: 10/07/16 11:31 AM  Result Value Ref Range   Sodium 136 135 - 145 mmol/L   Potassium 4.9 3.5 - 5.1 mmol/L    Comment: NO VISIBLE HEMOLYSIS   Chloride 96 (L) 101 - 111 mmol/L   CO2 29 22 - 32 mmol/L   Glucose, Bld 99 65 - 99 mg/dL   BUN 38 (H) 6 - 20 mg/dL   Creatinine, Ser 11.16 (H) 0.61 - 1.24 mg/dL   Calcium 7.7 (L) 8.9 - 10.3 mg/dL   Phosphorus 5.5 (H) 2.5 - 4.6 mg/dL   Albumin 2.9 (L) 3.5 - 5.0 g/dL   GFR calc non Af Amer 5 (L) >60 mL/min   GFR calc Af Amer 5 (L) >60 mL/min    Comment: (NOTE) The eGFR has been calculated using the CKD EPI equation. This calculation has not been validated in all clinical situations. eGFR's persistently <60 mL/min signify possible Chronic Kidney Disease.    Anion gap 11 5 - 15  Renal function panel     Status: Abnormal   Collection Time: 10/08/16  5:21 AM  Result Value Ref Range   Sodium 134 (L) 135 - 145 mmol/L   Potassium 5.0 3.5 - 5.1 mmol/L   Chloride 95 (L) 101 - 111 mmol/L   CO2 28 22 - 32 mmol/L   Glucose, Bld 68 65 - 99 mg/dL   BUN 16 6 - 20 mg/dL    Creatinine, Ser 6.59 (H) 0.61 - 1.24 mg/dL    Comment: DELTA CHECK NOTED   Calcium 7.8 (L) 8.9 - 10.3 mg/dL   Phosphorus 4.4 2.5 - 4.6 mg/dL   Albumin 2.8 (L) 3.5 - 5.0 g/dL   GFR calc non Af Amer 9 (L) >60 mL/min   GFR calc Af Amer 10 (L) >60 mL/min    Comment: (NOTE) The eGFR has been calculated using the CKD EPI equation. This calculation has not been validated in all clinical situations. eGFR's persistently <60 mL/min signify possible Chronic Kidney Disease.    Anion gap 11 5 - 15     Lipid Panel  No results found for: CHOL, TRIG, HDL, CHOLHDL, VLDL, LDLCALC, LDLDIRECT   Lab Results  Component Value  Date   HGBA1C 4.6 (L) 10/05/2016   HGBA1C 5.5 08/04/2016   HGBA1C 5.4 02/11/2015      HPI :  51 year old African-American man with past medical history significant for hypertension, combined systolic/diastolic heart failure, nonischemic cardiomyopathy, history of depression, history of DVT/PE and end-stage renal disease on hemodialysis on a Monday/Wednesday/Friday schedule at [facility], [place].  Past history punctuated by several visits to the emergency room for multiple complaints over the past 6 months and recently was seen in the emergency room and evaluated for abdominal pain with negative workup however, presented again to the [facility] emergency room earlier today with worsening abdominal pain. Radiological workup was negative for the etiology of this pain however, he was deemed to be having shortness of breath (no documented hypoxemia) and was found to have an elevated potassium of 6.2 having missed his dialysis yesterday. He states that he went to his dialysis on Wednesday and left at his dry weight complete his entire treatment.  He reports abdominal pain for the past week or so associated with nausea and vomiting but no diarrhea. Denies any hematochezia, melena or hematemesis. Denies any fever or chills. Denies any dysuria, urgency, frequency or flank pain  HOSPITAL  COURSE:    Acute on chronic abdominal pain, probable enterocolitis -Presented with nausea, vomiting and diffuse but mostly epigastric abdominal pain. -His pain is acute on chronic, been evaluated extensively before. No hematemesis or hematochezia. -CT scan abdomen and pelvis showed 4.4 x 6.1 cm air-fluid level, likely representing mildly dilated loop of large intestine less likely abscess, suspicious for enterocolitis, if symptoms persist a follow-up CT with oral contrast, may better assess patient's  presentation. Attempt to obtain a second CT abdomen pelvis with by mouth contrast was made. However patient declined to drink oral contrast. Apparently his appetite did improve overnight and the patient kept requesting food throughout the night Initially patient was treated with imipenem, now switched to ciprofloxacin and Flagyl 7 days We feel that his GI issues have resolved the patient will be discharged home today    Hyperkalemia: Secondary to missed dialysis and likely dietary indiscretions. Dialyzed 3/17, 3/19  End-stage renal disease:underwent hemodialysis yesterday after missed treatment on Friday. His next hemodialysis is scheduled for tomorrow -Nephrology consulted and followed patient during this hospitalization  Chronic back pain -Patient is on multiple pain medications for his back pain, continued Neurontin and Percocet.  Hypertension Continue home medications.  Chronic combined systolic and diastolic CHF -Secondary to nonischemic cardiomyopathy, currently on room air. -LVEF was 23% in February 2017, improved to 35-40% in April 2017. -Started home medications including Coreg, consider ACEI or ARB. We will defer to nephrology   Anemia of chronic kidney disease: Hemoglobin 2 days ago was 10.2, resume Aranesp with hemodialysis next week  Left renal mass--enlarged over the past year and is suspicious for renal cell carcinoma. Pt will likely need radical nephrectomy as  the kidney is atrophic and non functioning. Patient has seen Dr. Diona Fanti  , and last follow-up to him, he is recommended to follow-up with him after discharge. MRI of thoracic, lumbar and sacral spine did not reveal any metastatic disease  Discharge Exam:   Blood pressure (!) 162/94, pulse 96, temperature 98.3 F (36.8 C), temperature source Oral, resp. rate 20, height _0  (1.88 m), weight 75.3 kg (166 lb 0.1 oz), SpO2 94 %.   QTM:[id number] sleeping in bed CVS: Pulse regular in rate and rhythm, normal S1 and S2 Resp: Poor inspiratory effort, diminished breath sounds at  the bases otherwise clear Abd: Soft, flat, minimal tenderness over epigastric area Ext: Trace lower extremity edema, left radiocephalic fistula with good thrill   Follow-up Information    Primary care provider. Call.   Why:  To make follow-up appointment       DAHLSTEDT, Lillette Boxer, MD. Call.   Specialty:  Urology Why:  To make appointment for left renal mass Contact information: [address] [place] [address] [phone]           Signed: Reyne Dumas 10/08/2016, 8:55 AM        Time spent >45 mins

## 2016-10-08 NOTE — Clinical Social Work Note (Signed)
Patient medically stable for discharge home and cab voucher requested by patient to get to his car. CSW informed that patient's car is at [place]. Cab voucher completed (given to patient's nurse) and Assunta Gambles contacted to confirm cost - $38.00.  Tiann Saha Givens, MSW, LCSW Licensed Clinical Social Worker [place] [phone]

## 2016-10-08 NOTE — Progress Notes (Signed)
Pt eating without difficulty. No nausea or vomiting reported.

## 2016-10-08 NOTE — Progress Notes (Signed)
Pt requesting more apple juice, milk and cereal.  Advised pt about his fluid restriction.  Pt has little interest or motivation for education, and is quite resistant at times.

## 2016-10-08 NOTE — Progress Notes (Signed)
[facility] Progress Note   Subjective: back still hurts.  LS and thoracic spine MRI's were neg for malignancy.    Vitals:   10/07/16 2245 10/07/16 2254 10/07/16 2328 10/08/16 0500  BP: (!) 175/103 (!) 182/91 (!) 163/110 (!) 162/94  Pulse: 83 80 96   Resp:  _0 Temp:  98.7 F (37.1 C) 98.6 F (37 C) 98.3 F (36.8 C)  TempSrc:  Oral Oral Oral  SpO2:  92% 94%   Weight:  75.3 kg (166 lb 0.1 oz)    Height:        Inpatient medications: . allopurinol  100 mg Oral Daily  . amLODipine  5 mg Oral QHS  . atorvastatin  40 mg Oral q1800  . calcitRIOL  1 mcg Oral Q M,W,F-HD  . carvedilol  25 mg Oral BID WC  . cinacalcet  30 mg Oral QPM  . ciprofloxacin  500 mg Oral Q1200  . clonazePAM  1 mg Oral BID  . cloNIDine  0.3 mg Transdermal Q Mon  . darbepoetin (ARANESP) injection - DIALYSIS  60 mcg Intravenous Q Mon-HD  . doxazosin  2 mg Oral Daily  . famotidine  20 mg Oral Daily  . gabapentin  100 mg Oral QHS  . heparin  5,000 Units Subcutaneous Q8H  . isosorbide mononitrate  60 mg Oral Daily  . metroNIDAZOLE  500 mg Oral Q8H  . pantoprazole  40 mg Oral Daily  . sevelamer carbonate  1,600 mg Oral TID WC  . sodium chloride flush  3 mL Intravenous Q12H   . sodium chloride 10 mL/hr at 10/05/16 1818   methocarbamol, ondansetron **OR** ondansetron (ZOFRAN) IV, oxyCODONE-acetaminophen, senna-docusate  Exam: Alert, no distress No jvd Chest clear bilat RRR no mrg Abd soft , ntnd no ascites Lower back painful to percussion over L2-L4 Ext no edema L forearm aVF+bruit NF, Ox 3  Dialysis: MWF [facility] 4h   2/2.25 bath  Hep 2000   LFA AVF       Assessment: 1. Abd pain/ nausea/ vomit/ diarrhea - per primary 2. Back pain - MRI of T/ LS spine is neg for mets or other abnormalities 3. Renal mass, Left - last seen by urology April 2017, have d/w primary team 4. ESRD on HD MWF 5. Volume - close to dry wt 6. HTN -stable, on 4 bp meds 7. Dispo - for dc  today  Plan - as above   Kelly Splinter MD [employer] pager [phone]   10/08/2016, 10:12 AM    Recent Labs Lab 10/06/16 0627 10/07/16 1131 10/08/16 0521  NA 136 136 134*  K 4.0 4.9 5.0  CL 97* 96* 95*  CO2 _1 GLUCOSE 71 99 68  BUN 19 38* 16  CREATININE 7.69* 11.16* 6.59*  CALCIUM 7.9* 7.7* 7.8*  PHOS 3.6 5.5* 4.4    Recent Labs Lab 10/03/16 2229 10/06/16 0627 10/07/16 1131 10/08/16 0521  AST 22  --   --   --   ALT 9*  --   --   --   ALKPHOS 170*  --   --   --   BILITOT 0.5  --   --   --   PROT 7.2  --   --   --   ALBUMIN 3.3* 2.7* 2.9* 2.8*    Recent Labs Lab 10/03/16 2229 10/05/16 1821 10/06/16 0627  WBC 5.0 7.5 4.6  HGB 10.2* 11.0* 9.3*  HCT 33.1* 35.1* 29.5*  MCV 86.9 86.2 85.3  PLT 178 186 153   Iron/TIBC/Ferritin/ %Sat    Component Value Date/Time   IRON 41 (L) 05/29/2016 1350   TIBC 190 (L) 05/29/2016 1350   FERRITIN 328 11/02/2015 0346   IRONPCTSAT 22 05/29/2016 1350

## 2016-10-08 NOTE — Progress Notes (Signed)
K. Schorr called back. Was told to chart that the pt had refused his contrast and that the doctor could change/discontinue order in the AM.

## 2016-10-11 DIAGNOSIS — D509 Iron deficiency anemia, unspecified: Secondary | ICD-10-CM | POA: Diagnosis not present

## 2016-10-11 DIAGNOSIS — N186 End stage renal disease: Secondary | ICD-10-CM | POA: Diagnosis not present

## 2016-10-11 DIAGNOSIS — N2581 Secondary hyperparathyroidism of renal origin: Secondary | ICD-10-CM | POA: Diagnosis not present

## 2016-10-11 DIAGNOSIS — D631 Anemia in chronic kidney disease: Secondary | ICD-10-CM | POA: Diagnosis not present

## 2016-10-12 DIAGNOSIS — Z888 Allergy status to other drugs, medicaments and biological substances status: Secondary | ICD-10-CM | POA: Diagnosis not present

## 2016-10-12 DIAGNOSIS — Z992 Dependence on renal dialysis: Secondary | ICD-10-CM | POA: Diagnosis not present

## 2016-10-12 DIAGNOSIS — N281 Cyst of kidney, acquired: Secondary | ICD-10-CM | POA: Diagnosis not present

## 2016-10-12 DIAGNOSIS — K5900 Constipation, unspecified: Secondary | ICD-10-CM | POA: Diagnosis not present

## 2016-10-12 DIAGNOSIS — R110 Nausea: Secondary | ICD-10-CM | POA: Diagnosis not present

## 2016-10-12 DIAGNOSIS — I517 Cardiomegaly: Secondary | ICD-10-CM | POA: Diagnosis not present

## 2016-10-12 DIAGNOSIS — I120 Hypertensive chronic kidney disease with stage 5 chronic kidney disease or end stage renal disease: Secondary | ICD-10-CM | POA: Diagnosis not present

## 2016-10-12 DIAGNOSIS — E1122 Type 2 diabetes mellitus with diabetic chronic kidney disease: Secondary | ICD-10-CM | POA: Diagnosis not present

## 2016-10-12 DIAGNOSIS — R9431 Abnormal electrocardiogram [ECG] [EKG]: Secondary | ICD-10-CM | POA: Diagnosis not present

## 2016-10-12 DIAGNOSIS — Z79899 Other long term (current) drug therapy: Secondary | ICD-10-CM | POA: Diagnosis not present

## 2016-10-12 DIAGNOSIS — R1013 Epigastric pain: Secondary | ICD-10-CM | POA: Diagnosis not present

## 2016-10-12 DIAGNOSIS — N186 End stage renal disease: Secondary | ICD-10-CM | POA: Diagnosis not present

## 2016-10-12 DIAGNOSIS — N289 Disorder of kidney and ureter, unspecified: Secondary | ICD-10-CM | POA: Diagnosis not present

## 2016-10-12 DIAGNOSIS — E119 Type 2 diabetes mellitus without complications: Secondary | ICD-10-CM | POA: Diagnosis not present

## 2016-10-13 ENCOUNTER — Inpatient Hospital Stay (HOSPITAL_COMMUNITY)
Admission: EM | Admit: 2016-10-13 | Discharge: 2016-10-15 | DRG: 640 | Discharge status: Against Medical Advice | Payer: Medicare Other | Attending: Internal Medicine | Admitting: Internal Medicine

## 2016-10-13 ENCOUNTER — Encounter (HOSPITAL_COMMUNITY): Payer: Self-pay | Admitting: Emergency Medicine

## 2016-10-13 ENCOUNTER — Emergency Department (HOSPITAL_COMMUNITY): Payer: Medicare Other

## 2016-10-13 DIAGNOSIS — E1122 Type 2 diabetes mellitus with diabetic chronic kidney disease: Secondary | ICD-10-CM | POA: Diagnosis present

## 2016-10-13 DIAGNOSIS — R0602 Shortness of breath: Secondary | ICD-10-CM | POA: Diagnosis not present

## 2016-10-13 DIAGNOSIS — I509 Heart failure, unspecified: Secondary | ICD-10-CM | POA: Diagnosis not present

## 2016-10-13 DIAGNOSIS — R1084 Generalized abdominal pain: Secondary | ICD-10-CM | POA: Diagnosis not present

## 2016-10-13 DIAGNOSIS — Z79891 Long term (current) use of opiate analgesic: Secondary | ICD-10-CM

## 2016-10-13 DIAGNOSIS — E1129 Type 2 diabetes mellitus with other diabetic kidney complication: Secondary | ICD-10-CM | POA: Diagnosis present

## 2016-10-13 DIAGNOSIS — K5900 Constipation, unspecified: Secondary | ICD-10-CM | POA: Diagnosis present

## 2016-10-13 DIAGNOSIS — R1013 Epigastric pain: Secondary | ICD-10-CM | POA: Diagnosis present

## 2016-10-13 DIAGNOSIS — Z8249 Family history of ischemic heart disease and other diseases of the circulatory system: Secondary | ICD-10-CM

## 2016-10-13 DIAGNOSIS — Z87891 Personal history of nicotine dependence: Secondary | ICD-10-CM

## 2016-10-13 DIAGNOSIS — I132 Hypertensive heart and chronic kidney disease with heart failure and with stage 5 chronic kidney disease, or end stage renal disease: Secondary | ICD-10-CM | POA: Diagnosis not present

## 2016-10-13 DIAGNOSIS — F419 Anxiety disorder, unspecified: Secondary | ICD-10-CM | POA: Diagnosis present

## 2016-10-13 DIAGNOSIS — G8929 Other chronic pain: Secondary | ICD-10-CM | POA: Diagnosis present

## 2016-10-13 DIAGNOSIS — N2581 Secondary hyperparathyroidism of renal origin: Secondary | ICD-10-CM | POA: Diagnosis present

## 2016-10-13 DIAGNOSIS — Z885 Allergy status to narcotic agent status: Secondary | ICD-10-CM

## 2016-10-13 DIAGNOSIS — I152 Hypertension secondary to endocrine disorders: Secondary | ICD-10-CM | POA: Diagnosis present

## 2016-10-13 DIAGNOSIS — E1165 Type 2 diabetes mellitus with hyperglycemia: Secondary | ICD-10-CM | POA: Diagnosis present

## 2016-10-13 DIAGNOSIS — I161 Hypertensive emergency: Secondary | ICD-10-CM | POA: Diagnosis not present

## 2016-10-13 DIAGNOSIS — E785 Hyperlipidemia, unspecified: Secondary | ICD-10-CM | POA: Diagnosis present

## 2016-10-13 DIAGNOSIS — I429 Cardiomyopathy, unspecified: Secondary | ICD-10-CM | POA: Diagnosis not present

## 2016-10-13 DIAGNOSIS — E875 Hyperkalemia: Principal | ICD-10-CM

## 2016-10-13 DIAGNOSIS — Z886 Allergy status to analgesic agent status: Secondary | ICD-10-CM

## 2016-10-13 DIAGNOSIS — F329 Major depressive disorder, single episode, unspecified: Secondary | ICD-10-CM | POA: Diagnosis present

## 2016-10-13 DIAGNOSIS — Z86718 Personal history of other venous thrombosis and embolism: Secondary | ICD-10-CM

## 2016-10-13 DIAGNOSIS — IMO0002 Reserved for concepts with insufficient information to code with codable children: Secondary | ICD-10-CM | POA: Diagnosis present

## 2016-10-13 DIAGNOSIS — Z9115 Patient's noncompliance with renal dialysis: Secondary | ICD-10-CM

## 2016-10-13 DIAGNOSIS — I5042 Chronic combined systolic (congestive) and diastolic (congestive) heart failure: Secondary | ICD-10-CM | POA: Diagnosis not present

## 2016-10-13 DIAGNOSIS — T8612 Kidney transplant failure: Secondary | ICD-10-CM | POA: Diagnosis present

## 2016-10-13 DIAGNOSIS — Z992 Dependence on renal dialysis: Secondary | ICD-10-CM

## 2016-10-13 DIAGNOSIS — I517 Cardiomegaly: Secondary | ICD-10-CM | POA: Diagnosis not present

## 2016-10-13 DIAGNOSIS — N189 Chronic kidney disease, unspecified: Secondary | ICD-10-CM

## 2016-10-13 DIAGNOSIS — R001 Bradycardia, unspecified: Secondary | ICD-10-CM | POA: Diagnosis present

## 2016-10-13 DIAGNOSIS — I120 Hypertensive chronic kidney disease with stage 5 chronic kidney disease or end stage renal disease: Secondary | ICD-10-CM | POA: Diagnosis not present

## 2016-10-13 DIAGNOSIS — N186 End stage renal disease: Secondary | ICD-10-CM | POA: Diagnosis not present

## 2016-10-13 DIAGNOSIS — I10 Essential (primary) hypertension: Secondary | ICD-10-CM | POA: Diagnosis present

## 2016-10-13 DIAGNOSIS — Z79899 Other long term (current) drug therapy: Secondary | ICD-10-CM

## 2016-10-13 DIAGNOSIS — D631 Anemia in chronic kidney disease: Secondary | ICD-10-CM | POA: Diagnosis present

## 2016-10-13 DIAGNOSIS — R112 Nausea with vomiting, unspecified: Secondary | ICD-10-CM

## 2016-10-13 DIAGNOSIS — M549 Dorsalgia, unspecified: Secondary | ICD-10-CM | POA: Diagnosis present

## 2016-10-13 DIAGNOSIS — F4323 Adjustment disorder with mixed anxiety and depressed mood: Secondary | ICD-10-CM | POA: Diagnosis present

## 2016-10-13 DIAGNOSIS — K219 Gastro-esophageal reflux disease without esophagitis: Secondary | ICD-10-CM | POA: Diagnosis present

## 2016-10-13 DIAGNOSIS — Z888 Allergy status to other drugs, medicaments and biological substances status: Secondary | ICD-10-CM

## 2016-10-13 DIAGNOSIS — Z86711 Personal history of pulmonary embolism: Secondary | ICD-10-CM

## 2016-10-13 DIAGNOSIS — Y830 Surgical operation with transplant of whole organ as the cause of abnormal reaction of the patient, or of later complication, without mention of misadventure at the time of the procedure: Secondary | ICD-10-CM | POA: Diagnosis present

## 2016-10-13 DIAGNOSIS — E1159 Type 2 diabetes mellitus with other circulatory complications: Secondary | ICD-10-CM | POA: Diagnosis present

## 2016-10-13 LAB — CBC WITH DIFFERENTIAL/PLATELET
BASOS ABS: 0 10*3/uL (ref 0.0–0.1)
BASOS PCT: 0 %
EOS PCT: 5 %
Eosinophils Absolute: 0.2 10*3/uL (ref 0.0–0.7)
HCT: 31 % — ABNORMAL LOW (ref 39.0–52.0)
Hemoglobin: 9.7 g/dL — ABNORMAL LOW (ref 13.0–17.0)
Lymphocytes Relative: 24 %
Lymphs Abs: 1.2 10*3/uL (ref 0.7–4.0)
MCH: 26.6 pg (ref 26.0–34.0)
MCHC: 31.3 g/dL (ref 30.0–36.0)
MCV: 84.9 fL (ref 78.0–100.0)
MONO ABS: 0.6 10*3/uL (ref 0.1–1.0)
Monocytes Relative: 12 %
Neutro Abs: 3 10*3/uL (ref 1.7–7.7)
Neutrophils Relative %: 59 %
PLATELETS: 218 10*3/uL (ref 150–400)
RBC: 3.65 MIL/uL — ABNORMAL LOW (ref 4.22–5.81)
RDW: 17.3 % — AB (ref 11.5–15.5)
WBC: 5.1 10*3/uL (ref 4.0–10.5)

## 2016-10-13 LAB — LIPASE, BLOOD

## 2016-10-13 LAB — COMPREHENSIVE METABOLIC PANEL
ALBUMIN: 3.2 g/dL — AB (ref 3.5–5.0)
ALT: 9 U/L — ABNORMAL LOW (ref 17–63)
ANION GAP: 13 (ref 5–15)
AST: 25 U/L (ref 15–41)
Alkaline Phosphatase: 148 U/L — ABNORMAL HIGH (ref 38–126)
BUN: 41 mg/dL — ABNORMAL HIGH (ref 6–20)
CHLORIDE: 97 mmol/L — AB (ref 101–111)
CO2: 26 mmol/L (ref 22–32)
Calcium: 8.1 mg/dL — ABNORMAL LOW (ref 8.9–10.3)
Creatinine, Ser: 12.93 mg/dL — ABNORMAL HIGH (ref 0.61–1.24)
GFR calc Af Amer: 4 mL/min — ABNORMAL LOW (ref >60)
GFR, EST NON AFRICAN AMERICAN: 4 mL/min — AB (ref >60)
Glucose, Bld: 100 mg/dL — ABNORMAL HIGH (ref 65–99)
POTASSIUM: 6.8 mmol/L — AB (ref 3.5–5.1)
Sodium: 136 mmol/L (ref 135–145)
Total Bilirubin: 0.5 mg/dL (ref 0.3–1.2)
Total Protein: 7.3 g/dL (ref 6.5–8.1)

## 2016-10-13 LAB — I-STAT TROPONIN, ED: TROPONIN I, POC: 0.03 ng/mL (ref 0.00–0.08)

## 2016-10-13 LAB — GLUCOSE, RANDOM: GLUCOSE: 71 mg/dL (ref 65–99)

## 2016-10-13 MED ORDER — HEPARIN SODIUM (PORCINE) 5000 UNIT/ML IJ SOLN
5000.0000 [IU] | Freq: Three times a day (TID) | INTRAMUSCULAR | Status: DC
  Filled 2016-10-13: qty 1

## 2016-10-13 MED ORDER — VALACYCLOVIR HCL 500 MG PO TABS: 500.0000 mg | ORAL_TABLET | Freq: Two times a day (BID) | ORAL | Status: DC

## 2016-10-13 MED ORDER — CLONAZEPAM 1 MG PO TABS
1.0000 mg | ORAL_TABLET | Freq: Two times a day (BID) | ORAL | Status: DC
  Administered 2016-10-13 – 2016-10-14 (×3): 1 mg via ORAL
  Filled 2016-10-13 (×3): qty 1

## 2016-10-13 MED ORDER — SODIUM CHLORIDE 0.9 % IV SOLN
1.0000 g | Freq: Once | INTRAVENOUS | Status: AC
  Administered 2016-10-13: 1 g via INTRAVENOUS
  Filled 2016-10-13: qty 10

## 2016-10-13 MED ORDER — PROMETHAZINE HCL 25 MG/ML IJ SOLN: 12.5000 mg | INTRAMUSCULAR | Status: DC | PRN

## 2016-10-13 MED ORDER — HYDROMORPHONE HCL 1 MG/ML IJ SOLN
1.0000 mg | Freq: Once | INTRAMUSCULAR | Status: AC
  Administered 2016-10-13: 1 mg via INTRAVENOUS
  Filled 2016-10-13: qty 1

## 2016-10-13 MED ORDER — LIDOCAINE HCL (PF) 1 % IJ SOLN: 5.0000 mL | INTRAMUSCULAR | Status: DC | PRN

## 2016-10-13 MED ORDER — HEPARIN SODIUM (PORCINE) 1000 UNIT/ML DIALYSIS: 2000.0000 [IU] | Freq: Once | INTRAMUSCULAR | Status: DC

## 2016-10-13 MED ORDER — DICYCLOMINE HCL 10 MG PO CAPS
10.0000 mg | ORAL_CAPSULE | Freq: Once | ORAL | Status: AC
  Administered 2016-10-13: 10 mg via ORAL
  Filled 2016-10-13: qty 1

## 2016-10-13 MED ORDER — DOXAZOSIN MESYLATE 2 MG PO TABS
2.0000 mg | ORAL_TABLET | Freq: Every day | ORAL | Status: DC
  Administered 2016-10-14: 2 mg via ORAL
  Filled 2016-10-13: qty 1

## 2016-10-13 MED ORDER — MORPHINE SULFATE (PF) 4 MG/ML IV SOLN
4.0000 mg | Freq: Once | INTRAVENOUS | Status: AC
  Administered 2016-10-13: 4 mg via INTRAMUSCULAR
  Filled 2016-10-13: qty 1

## 2016-10-13 MED ORDER — SODIUM CHLORIDE 0.9 % IV SOLN: 100.0000 mL | INTRAVENOUS | Status: DC | PRN

## 2016-10-13 MED ORDER — INSULIN ASPART 100 UNIT/ML IV SOLN
10.0000 [IU] | Freq: Once | INTRAVENOUS | Status: AC
  Administered 2016-10-13: 10 [IU] via INTRAVENOUS
  Filled 2016-10-13: qty 0.1

## 2016-10-13 MED ORDER — SODIUM BICARBONATE 8.4 % IV SOLN
50.0000 meq | Freq: Once | INTRAVENOUS | Status: AC
  Administered 2016-10-13: 50 meq via INTRAVENOUS
  Filled 2016-10-13: qty 50

## 2016-10-13 MED ORDER — SODIUM POLYSTYRENE SULFONATE 15 GM/60ML PO SUSP
60.0000 g | Freq: Once | ORAL | Status: DC
  Filled 2016-10-13: qty 240

## 2016-10-13 MED ORDER — AMLODIPINE BESYLATE 5 MG PO TABS
5.0000 mg | ORAL_TABLET | Freq: Every day | ORAL | Status: DC
  Administered 2016-10-14: 5 mg via ORAL
  Filled 2016-10-13 (×2): qty 1

## 2016-10-13 MED ORDER — CIPROFLOXACIN IN D5W 400 MG/200ML IV SOLN
400.0000 mg | INTRAVENOUS | Status: DC
  Administered 2016-10-14: 400 mg via INTRAVENOUS
  Filled 2016-10-13 (×2): qty 200

## 2016-10-13 MED ORDER — HEPARIN SODIUM (PORCINE) 1000 UNIT/ML DIALYSIS: 1000.0000 [IU] | INTRAMUSCULAR | Status: DC | PRN

## 2016-10-13 MED ORDER — CALCITRIOL 0.5 MCG PO CAPS
1.0000 ug | ORAL_CAPSULE | ORAL | Status: DC
  Administered 2016-10-14: 1 ug via ORAL

## 2016-10-13 MED ORDER — METRONIDAZOLE IN NACL 5-0.79 MG/ML-% IV SOLN
500.0000 mg | Freq: Three times a day (TID) | INTRAVENOUS | Status: DC
  Administered 2016-10-14 (×2): 500 mg via INTRAVENOUS
  Filled 2016-10-13 (×2): qty 100

## 2016-10-13 MED ORDER — ALTEPLASE 2 MG IJ SOLR: 2.0000 mg | Freq: Once | INTRAMUSCULAR | Status: DC | PRN

## 2016-10-13 MED ORDER — DEXTROSE 50 % IV SOLN
1.0000 | Freq: Once | INTRAVENOUS | Status: AC
  Administered 2016-10-13: 50 mL via INTRAVENOUS
  Filled 2016-10-13 (×2): qty 50

## 2016-10-13 MED ORDER — ALLOPURINOL 100 MG PO TABS
100.0000 mg | ORAL_TABLET | Freq: Every day | ORAL | Status: DC
  Administered 2016-10-14: 100 mg via ORAL
  Filled 2016-10-13: qty 1

## 2016-10-13 MED ORDER — ALBUTEROL SULFATE (2.5 MG/3ML) 0.083% IN NEBU
10.0000 mg | INHALATION_SOLUTION | Freq: Once | RESPIRATORY_TRACT | Status: DC
  Filled 2016-10-13: qty 12

## 2016-10-13 MED ORDER — PANTOPRAZOLE SODIUM 40 MG IV SOLR
40.0000 mg | INTRAVENOUS | Status: DC
Start: 2016-10-13 — End: 2016-10-14
  Administered 2016-10-13: 40 mg via INTRAVENOUS
  Filled 2016-10-13: qty 40

## 2016-10-13 MED ORDER — LIDOCAINE-PRILOCAINE 2.5-2.5 % EX CREA: 1.0000 "application " | TOPICAL_CREAM | CUTANEOUS | Status: DC | PRN

## 2016-10-13 MED ORDER — CINACALCET HCL 30 MG PO TABS
30.0000 mg | ORAL_TABLET | Freq: Every evening | ORAL | Status: DC
  Administered 2016-10-14: 30 mg via ORAL
  Filled 2016-10-13: qty 1

## 2016-10-13 MED ORDER — HYDROMORPHONE HCL 1 MG/ML IJ SOLN
1.0000 mg | INTRAMUSCULAR | Status: DC | PRN
  Administered 2016-10-14 (×2): 1 mg via INTRAVENOUS
  Filled 2016-10-13: qty 1

## 2016-10-13 MED ORDER — PENTAFLUOROPROP-TETRAFLUOROETH EX AERO: 1.0000 "application " | INHALATION_SPRAY | CUTANEOUS | Status: DC | PRN

## 2016-10-13 NOTE — H&P (Signed)
History and Physical    Frank [name] QHU:[id number] DOB: [birth year] DOA: 10/13/2016  PCP: [name] Patient   Patient coming from: Home.  I have personally briefly reviewed patient's old medical records in [place]  Chief Complaint: Abdominal pain.  HPI: Frank [name] is a 51 y.o. male with medical history significant of ESRD on hemodialysis, anemia renal disease, anxiety, depression, chronic combined systolic and diastolic CHF, hypertension, hyperlipidemia, type 2 diabetes, pancreatitis in the past who is coming to the emergency department with complaints of abdominal pain for the past 3-4 days.  Per patient, he has been having pain in his epigastric area for the past few weeks. However, he states that over the last few days his pain has increased significantly area. He has had multiple episodes of emesis (at least 30) over the past 2 days. He was seen in the [facility] ED yesterday, where he received 2 L of IV fluids, analgesics, antiemetics and a CT scan abdomen/pelvis was done without finding a specific etiology for his pain. He denies fever, chills, chest pain, palpitations, dizziness, diaphoresis, pitting edema of the lower extremities. He denies productive cough, dyspnea, wheezing or hemoptysis. He denies diarrhea, constipation, melena, hematochezia or GU symptoms. He states that he has been awake since about 2 AM today and has not been able to hold his medications due to nausea and emesis.  ED Course: The patient received morphine, Zofran for pain. His potassium level was 6.8 mmol/L which was treated with calcium gluconate 1 g, dextrose 50%, sodium bicarbonate 1 amp and 10 units of insulin IV. He also received Kayexalate.  The rest of his lab work shows WBC 5.1, hemoglobin 9.7 g/dL and platelets 218. His sodium 136, chloride 97, bicarbonate 26 mmol/L. EKG shows sinus bradycardia with first-degree AV block at 40 BPM. His troponin level was 0.03 ng/mL. Lipase level is less than  10.  Review of Systems: As per HPI otherwise 10 point review of systems negative.    Past Medical History:  Diagnosis Date  . Anemia   . Anxiety   . Chronic combined systolic and diastolic CHF (congestive heart failure) (HCC)    a. EF 20-25% by echo in 08/2015 b. echo 10/2015: EF 35-40%, diffuse HK, severe LAE, moderate RAE, small pericardial effusion  . Complication of anesthesia    itching, sore throat  . Depression   . Dialysis patient ([place])   . ESRD (end stage renal disease) ([place])    due to HTN per patient, followed at [facility], s/p failed kidney transplant - dialysis Tue, Th, Sat  . Hyperkalemia 12/2015  . Hypertension   . Junctional rhythm    a. noted in 08/2015: hyperkalemic at that time  b. 12/2015: presented in junctional rhythm w/ K+ of 6.6. Resolved with improvement of K+ levels.  . Nonischemic cardiomyopathy ([place])    a. 08/2014: cath showing minimal CAD, but tortuous arteries noted.   . Personal history of DVT (deep vein thrombosis)/ PE 05/26/2016   In Oct 2015 had small subsemental LUL PE w/o DVT (LE dopplers neg) and was felt to be HD cath related, treated w coumadin.  IN May 2016 had small vein DVT (acute/subacute) in the R basilic/ brachial veins of the RUE, resumed on coumadin.  Had R sided HD cath at that time.    . Renal insufficiency   . Shortness of breath   . Type II diabetes mellitus (HCC)    No history per patient, but remains under history as  A1c would not be accurate given on dialysis    Past Surgical History:  Procedure Laterality Date  . CAPD INSERTION    . CAPD REMOVAL    . INGUINAL HERNIA REPAIR Right 02/14/2015   Procedure: REPAIR INCARCERATED RIGHT INGUINAL HERNIA;  Surgeon: [name], MD;  Location: [place];  Service: General;  Laterality: Right;  . INSERTION OF DIALYSIS CATHETER Right 09/23/2015   Procedure: exchange of Right internal Dialysis Catheter.;  Surgeon: [name], MD;  Location: [name];  Service: Vascular;  Laterality: Right;  . IR  GENERIC HISTORICAL  07/16/2016   IR US GUIDE VASC ACCESS LEFT 07/16/2016 Corrie Mckusick, DO [facility]-INTERV RAD  . IR GENERIC HISTORICAL Left 07/16/2016   IR THROMBECTOMY AV FISTULA W/THROMBOLYSIS/PTA INC/SHUNT/IMG LEFT 07/16/2016 Corrie Mckusick, DO [facility]-INTER[facility]  . KIDNEY RECEIPIENT  2006   failed and started HD in March 2014  . LEFT HEART CATHETERIZATION WITH CORONARY ANGIOGRAM N/A 09/02/2014   Procedure: LEFT HEART CATHETERIZATION WITH CORONARY ANGIOGRAM;  Surgeon: [name], MD;  Location: [facility] CATH LAB;  Service: Cardiovascular;  Laterality: N/A;     reports that he has quit smoking. His smoking use included Cigarettes. He smoked 0.00 packs per day for 1.00 year. He has never used smokeless tobacco. He reports that he does not drink alcohol or use drugs.  Allergies  Allergen Reactions  . Butalbital-Apap-Caffeine Shortness Of Breath, Swelling and Other (See Comments)    Swelling in throat  . Ferrlecit [Na Ferric Gluc Cplx In Sucrose] Shortness Of Breath, Swelling and Other (See Comments)    Swelling in throat, tolerates Venofor  . Minoxidil Shortness Of Breath  . Tylenol [Acetaminophen] Anaphylaxis and Swelling  . Darvocet [Propoxyphene N-Acetaminophen] Hives    Family History  Problem Relation Age of Onset  . Hypertension Other     Prior to Admission medications   Medication Sig Start Date End Date Taking? Authorizing Provider  acetaminophen-codeine (TYLENOL #3) 300-30 MG tablet Take 1 tablet by mouth every 6 (six) hours as needed. Patient not taking: Reported on 10/05/2016 08/09/16   Tresa Garter, MD  allopurinol (ZYLOPRIM) 100 MG tablet Take 100 mg by mouth daily.    Historical Provider, MD  ALPRAZolam [name]) 1 MG tablet Take 1 tablet (1 mg total) by mouth at bedtime as needed for sleep. Patient not taking: Reported on 10/05/2016 08/13/16   Okey Regal, PA-C  amLODipine (NORVASC) 5 MG tablet Take 1 tablet (5 mg total) by mouth at bedtime. 08/08/16   Nita Sells, MD   atorvastatin (LIPITOR) 40 MG tablet Take 1 tablet (40 mg total) by mouth daily at 6 PM. Patient not taking: Reported on 10/05/2016 09/02/14   Barton Dubois, MD  calcitRIOL (ROCALTROL) 0.5 MCG capsule Take 2 capsules (1 mcg total) by mouth every Monday, Wednesday, and Friday with hemodialysis. 10/07/16 11/06/16  Reyne Dumas, MD  carvedilol (COREG) 25 MG tablet Take 1 tablet (25 mg total) by mouth 2 (two) times daily with a meal. Patient not taking: Reported on 10/05/2016 08/08/16   Nita Sells, MD  cinacalcet (SENSIPAR) 30 MG tablet Take 30 mg by mouth every evening.     Historical Provider, MD  ciprofloxacin (CIPRO) 500 MG tablet Take 1 tablet (500 mg total) by mouth daily at 12 noon. 10/07/16 10/14/16  Reyne Dumas, MD  clonazePAM (KLONOPIN) 1 MG tablet Take 1 mg by mouth 2 (two) times daily.    Historical Provider, MD  cloNIDine (CATAPRES - DOSED IN MG/24 HR) 0.3 mg/24hr patch Place  1 patch (0.3 mg total) onto the skin every Monday. Patient not taking: Reported on 10/05/2016 08/19/16   Shawnee Knapp, MD  doxazosin (CARDURA) 2 MG tablet Take 1 tablet (2 mg total) by mouth daily. 08/08/16   Nita Sells, MD  famotidine (PEPCID) 20 MG tablet Take 1 tablet (20 mg total) by mouth 2 (two) times daily. Patient not taking: Reported on 10/05/2016 10/04/16   Varney Biles, MD  gabapentin (NEURONTIN) 100 MG capsule Take 1 capsule (100 mg total) by mouth at bedtime. Patient not taking: Reported on 10/05/2016 05/21/16   [name], DO  isosorbide mononitrate (IMDUR) 60 MG 24 hr tablet Take 1 tablet (60 mg total) by mouth daily. Patient not taking: Reported on 10/05/2016 08/08/16   Nita Sells, MD  methocarbamol (ROBAXIN) 500 MG tablet Take 1 tablet (500 mg total) by mouth every 6 (six) hours as needed for muscle spasms. 10/07/16 11/06/16  Reyne Dumas, MD  metroNIDAZOLE (FLAGYL) 500 MG tablet Take 1 tablet (500 mg total) by mouth every 8 (eight) hours. 10/07/16 10/14/16  Reyne Dumas, MD  omeprazole  (PRILOSEC) 20 MG capsule Take 1 capsule (20 mg total) by mouth daily. Patient not taking: Reported on 10/05/2016 08/08/16   Nita Sells, MD  ondansetron (ZOFRAN ODT) 4 MG disintegrating tablet Take 1 tablet (4 mg total) by mouth every 8 (eight) hours as needed for nausea or vomiting. Patient not taking: Reported on 10/05/2016 09/27/16   Ezequiel Essex, MD  senna-docusate (SENOKOT-S) 8.6-50 MG tablet Take 1 tablet by mouth at bedtime as needed for mild constipation. Patient not taking: Reported on 10/05/2016 08/08/16   Nita Sells, MD  sevelamer carbonate (RENVELA) 800 MG tablet Take 1,600 mg by mouth 3 (three) times daily with meals.     Historical Provider, MD    Physical Exam:  Constitutional: NAD, calm, comfortable Vitals:   10/13/16 1527 10/13/16 1600 10/13/16 1630 10/13/16 1830  BP: (!) 142/87 (!) 157/106 (!) 158/102 (!) 155/100  Pulse: (!) 47 (!) 58 60 (!) 50  Resp: _0 Temp: 98.6 F (37 C)     TempSrc: Oral     SpO2: 100% 99% 99% 95%  Weight:      Height:       Eyes: PERRL, lids and conjunctivae normal ENMT: Mucous membranes and lips are midly dry. Posterior pharynx clear of any exudate or lesions. Neck: normal, supple, no masses, no thyromegaly Respiratory: clear to auscultation bilaterally, no wheezing, no crackles. Normal respiratory effort. No accessory muscle use.  Cardiovascular: Regular rate and rhythm, no murmurs / rubs / gallops. No extremity edema. 2+ pedal pulses. No carotid bruits.  Abdomen: Soft, positive epigastric and diffuse tenderness, no guarding/rebound/masses palpated. No hepatosplenomegaly. Bowel sounds positive.  Musculoskeletal: no clubbing / cyanosis. No joint deformity upper and lower extremities. Good ROM, no contractures. Normal muscle tone.  Skin: no rashes, lesions, ulcers. No induration Neurologic: CN 2-12 grossly intact. Sensation intact, DTR normal. Strength 5/5 in all 4.  Psychiatric: Normal judgment and insight. Alert and  oriented x 3. Normal mood.    Labs on Admission: I have personally reviewed following labs and imaging studies  CBC:  Recent Labs Lab 10/13/16 1529  WBC 5.1  NEUTROABS 3.0  HGB 9.7*  HCT 31.0*  MCV 84.9  PLT 212   Basic Metabolic Panel:  Recent Labs Lab 10/07/16 1131 10/08/16 0521 10/13/16 1529  NA 136 134* 136  K 4.9 5.0 6.8*  CL 96* 95* 97*  CO2 29  28 26  GLUCOSE 99 68 100*  BUN 38* 16 41*  CREATININE 11.16* 6.59* 12.93*  CALCIUM 7.7* 7.8* 8.1*  PHOS 5.5* 4.4  --    GFR: Estimated Creatinine Clearance: 7.1 mL/min (A) (by C-G formula based on SCr of 12.93 mg/dL (H)). Liver Function Tests:  Recent Labs Lab 10/07/16 1131 10/08/16 0521 10/13/16 1529  AST  --   --  25  ALT  --   --  9*  ALKPHOS  --   --  148*  BILITOT  --   --  0.5  PROT  --   --  7.3  ALBUMIN 2.9* 2.8* 3.2*    Recent Labs Lab 10/13/16 1529  LIPASE <10*   No results for input(s): AMMONIA in the last 168 hours. Coagulation Profile: No results for input(s): INR, PROTIME in the last 168 hours. Cardiac Enzymes: No results for input(s): CKTOTAL, CKMB, CKMBINDEX, TROPONINI in the last 168 hours. BNP (last 3 results) No results for input(s): PROBNP in the last 8760 hours. HbA1C: No results for input(s): HGBA1C in the last 72 hours. CBG: No results for input(s): GLUCAP in the last 168 hours. Lipid Profile: No results for input(s): CHOL, HDL, LDLCALC, TRIG, CHOLHDL, LDLDIRECT in the last 72 hours. Thyroid Function Tests: No results for input(s): TSH, T4TOTAL, FREET4, T3FREE, THYROIDAB in the last 72 hours. Anemia Panel: No results for input(s): VITAMINB12, FOLATE, FERRITIN, TIBC, IRON, RETICCTPCT in the last 72 hours. Urine analysis:    Component Value Date/Time   COLORURINE YELLOW 10/18/2013 [place] 10/18/2013 0419   LABSPEC 1.008 10/18/2013 0419   PHURINE 8.5 (H) 10/18/2013 0419   GLUCOSEU 100 (A) 10/18/2013 0419   HGBUR TRACE (A) 10/18/2013 0419   BILIRUBINUR  NEGATIVE 10/18/2013 0419   KETONESUR NEGATIVE 10/18/2013 0419   PROTEINUR 100 (A) 10/18/2013 0419   UROBILINOGEN 0.2 10/18/2013 0419   NITRITE NEGATIVE 10/18/2013 0419   LEUKOCYTESUR NEGATIVE 10/18/2013 0419    Radiological Exams on Admission: Dg Chest 2 View  Result Date: 10/13/2016 CLINICAL DATA:  SOB and generalized abdominal pain with nausea and vomiting x 3-4 days. (Pt. Not compliant - thus 2 images with pt. Artifacts.) Hx of of ESRD, NICM with diastolic and systolic CHF, HTN, multiple admission to the hospital for abdominal pain. EXAM: CHEST  2 VIEW COMPARISON:  09/26/2016 FINDINGS: The heart is mildly enlarged. There is mild prominence of interstitial markings, consistent with mild interstitial pulmonary edema. Suspect small left pleural effusion. No focal consolidations. IMPRESSION: Cardiomegaly and mild interstitial edema. Electronically Signed   By: Nolon Nations M.D.   On: 10/13/2016 17:15   Dg Abdomen 1 View  Result Date: 10/13/2016 CLINICAL DATA:  Shortness of breath, generalized abdominal pain EXAM: ABDOMEN - 1 VIEW COMPARISON:  09/26/2016 FINDINGS: There is normal small bowel gas pattern. Some colonic gas and stool noted in right colon and transverse colon. Colonic gas noted within rectum. IMPRESSION: Normal small bowel gas pattern. Some colonic stool and gas noted in right colon and transverse colon. Electronically Signed   By: Lahoma Crocker M.D.   On: 10/13/2016 17:16    EKG: Independently reviewed. Vent. rate 40 BPM PR interval 210 ms QRS duration 96 ms QT/QTc 548/446 ms P-R-T axes 63 -11 85 Marked sinus bradycardia with sinus arrhythmia with 1st degree A-V block Left ventricular hypertrophy with repolarization abnormality Abnormal ECG  Assessment/Plan Principal Problem:   Hyperkalemia This has likely induced nausea and emesis as well. Sinus bradycardia likely as a result of  hyperkalemia as well. Admit to telemetry/observation. The patient received oral Kayexalate,  IV calcium gluconate,  IV bicarbonate, IV dextrose and IV insulin in the ER. He has been seen by Dr. Jonnie Finner who is going to hemodialyze the patient in the next few hours. Follow-up potassium level.  Active Problems:   Abdominal pain Keep nothing by mouth for now. Hydromorphone 1 mg IVP every 4 hours as needed for pain. Ondansetron 4 mg IVP every 6 hours as needed.    Anemia of chronic kidney failure Stable. Monitor hematocrit and hemoglobin. Erythropoietin per nephrology.    End-stage renal disease on hemodialysis ([place]) Renal diet. Continue allopurinol 100 mg by mouth daily.. Continue calcitriol 1 g every Monday, Wednesday and Friday with hemodialysis. Continue Sensipar 30 mg by mouth every morning. Renvela 1600 mg by mouth 3 times a day with meals. Hemodialysis per Dr. Burnett Sheng.    DM (diabetes mellitus), type 2, uncontrolled, with renal complications (HCC) Carbohydrate modified diet. CBG monitoring with regular insulin sliding scale while in the hospital.    Dyslipidemia Not taking atorvastatin 40 mg by mouth daily every evening. Continue diet and other lifestyle modifications.    Essential hypertension Continue Norvasc 5 mg by mouth at bedtime. Continue Cardura 2 mg by mouth daily. Not taking carvedilol 25 mg by mouth twice a day. Not using clonidine patch 0.3 mg every Monday. Monitor blood pressure.    Adjustment disorder with mixed anxiety and depressed mood Continue clonazepam 1 mg by mouth twice a day. On alprazolam 1 mg by mouth at bedtime when necessary.    GERD (gastroesophageal reflux disease) Continue daily proton pump inhibitor. Not taking famotidine 20 mg by mouth twice a day.     History of CHF Not taking Imdur at this time. Fluid and sodium restriction. Monitor intake and output.    DVT prophylaxis: Heparin SQ. Code Status: Full code. Family Communication:  Disposition Plan: Admit for hyperkalemia treatment and hemodialysis. Consults called:  Roney Jaffe, M.D. (nephrology). Admission status: Observation/Telemetry.   Reubin Milan MD [facility] Pager [phone].  If 7PM-7AM, please contact night-coverage [email or web address] Password [facility]  10/13/2016, 7:21 PM

## 2016-10-13 NOTE — ED Triage Notes (Signed)
Pt to ED with c/o generalized abd pain x's 3-4 days.  Pt st's he was seen at [facility] yesterday for same and was given 2 liters of fluid.  Pt also c/o shortness of breath and is scheduled for dialysis tomorrow.  Pt also c/o nausea and vomiting.

## 2016-10-13 NOTE — Consult Note (Signed)
Renal Service Consult Note [facility]  ISAIH BULGER 10/13/2016 [name] Requesting Physician: Dr. Olevia Bowens  Reason for Consult:  ESRD pt with hyperkalemia, SOB HPI: The patient is a 51 y.o. year-old with history of HTN, ESRD and NICM presenting to ED with SOB and abd pain.  Per patient he went to [facility] ED yest with abd pain and they gave him "2 bags of saline" and did a CT w contrast per patient. [place] home.  In ED now the K is high at 6.8, EKG sinus brady, +mild-mod dyspnea.   Sig abd pain, has hx of "pancreatitis" per the patient but his lipase is < 10, and LFT"s are wnl.  No diarrhea , denies N/V. Dyspneic since getting IVF at ED y esterday.   Asked to see for ^K/ SOB.    Patient says he didn't miss any HD last week.   ROS  denies CP  no joint pain   no HA  no blurry vision  no rash  no diarrhea  no nausea/ vomiting  no dysuria  no difficulty voiding  no change in urine color    Past Medical History  Past Medical History:  Diagnosis Date  . Anemia   . Anxiety   . Chronic combined systolic and diastolic CHF (congestive heart failure) (HCC)    a. EF 20-25% by echo in 08/2015 b. echo 10/2015: EF 35-40%, diffuse HK, severe LAE, moderate RAE, small pericardial effusion  . Complication of anesthesia    itching, sore throat  . Depression   . Dialysis patient ([place])   . ESRD (end stage renal disease) ([place])    due to HTN per patient, followed at [facility], s/p failed kidney transplant - dialysis Tue, Th, Sat  . Hyperkalemia 12/2015  . Hypertension   . Junctional rhythm    a. noted in 08/2015: hyperkalemic at that time  b. 12/2015: presented in junctional rhythm w/ K+ of 6.6. Resolved with improvement of K+ levels.  . Nonischemic cardiomyopathy ([place])    a. 08/2014: cath showing minimal CAD, but tortuous arteries noted.   . Personal history of DVT (deep vein thrombosis)/ PE 05/26/2016   In Oct 2015 had small subsemental LUL PE w/o DVT (LE dopplers neg) and was  felt to be HD cath related, treated w coumadin.  IN May 2016 had small vein DVT (acute/subacute) in the R basilic/ brachial veins of the RUE, resumed on coumadin.  Had R sided HD cath at that time.    . Renal insufficiency   . Shortness of breath   . Type II diabetes mellitus (HCC)    No history per patient, but remains under history as A1c would not be accurate given on dialysis   Past Surgical History  Past Surgical History:  Procedure Laterality Date  . CAPD INSERTION    . CAPD REMOVAL    . INGUINAL HERNIA REPAIR Right 02/14/2015   Procedure: REPAIR INCARCERATED RIGHT INGUINAL HERNIA;  Surgeon: [name], MD;  Location: [place];  Service: General;  Laterality: Right;  . INSERTION OF DIALYSIS CATHETER Right 09/23/2015   Procedure: exchange of Right internal Dialysis Catheter.;  Surgeon: [name], MD;  Location: [place];  Service: Vascular;  Laterality: Right;  . IR GENERIC HISTORICAL  07/16/2016   IR US GUIDE VASC ACCESS LEFT 07/16/2016 Corrie Mckusick, DO [facility]  . IR GENERIC HISTORICAL Left 07/16/2016   IR THROMBECTOMY AV FISTULA W/THROMBOLYSIS/PTA INC/SHUNT/IMG LEFT 07/16/2016 Corrie Mckusick, DO [facility]  . KIDNEY  RECEIPIENT  2006   failed and started HD in March 2014  . LEFT HEART CATHETERIZATION WITH CORONARY ANGIOGRAM N/A 09/02/2014   Procedure: LEFT HEART CATHETERIZATION WITH CORONARY ANGIOGRAM;  Surgeon: [name], MD;  Location: [facility] CATH LAB;  Service: Cardiovascular;  Laterality: N/A;   Family History  Family History  Problem Relation Age of Onset  . Hypertension Other    Social History  reports that he has quit smoking. His smoking use included Cigarettes. He smoked 0.00 packs per day for 1.00 year. He has never used smokeless tobacco. He reports that he does not drink alcohol or use drugs. Allergies  Allergies  Allergen Reactions  . Butalbital-Apap-Caffeine Shortness Of Breath, Swelling and Other (See Comments)    Swelling in throat  . Ferrlecit [Na  Ferric Gluc Cplx In Sucrose] Shortness Of Breath, Swelling and Other (See Comments)    Swelling in throat, tolerates Venofor  . Minoxidil Shortness Of Breath  . Tylenol [Acetaminophen] Anaphylaxis and Swelling  . Darvocet [Propoxyphene N-Acetaminophen] Hives   Home medications Prior to Admission medications   Medication Sig Start Date End Date Taking? Authorizing Provider  acetaminophen-codeine (TYLENOL #3) 300-30 MG tablet Take 1 tablet by mouth every 6 (six) hours as needed. Patient not taking: Reported on 10/05/2016 08/09/16   Tresa Garter, MD  allopurinol (ZYLOPRIM) 100 MG tablet Take 100 mg by mouth daily.    Historical Provider, MD  ALPRAZolam [name]) 1 MG tablet Take 1 tablet (1 mg total) by mouth at bedtime as needed for sleep. Patient not taking: Reported on 10/05/2016 08/13/16   Okey Regal, PA-C  amLODipine (NORVASC) 5 MG tablet Take 1 tablet (5 mg total) by mouth at bedtime. 08/08/16   Nita Sells, MD  atorvastatin (LIPITOR) 40 MG tablet Take 1 tablet (40 mg total) by mouth daily at 6 PM. Patient not taking: Reported on 10/05/2016 09/02/14   Barton Dubois, MD  calcitRIOL (ROCALTROL) 0.5 MCG capsule Take 2 capsules (1 mcg total) by mouth every Monday, Wednesday, and Friday with hemodialysis. 10/07/16 11/06/16  Reyne Dumas, MD  carvedilol (COREG) 25 MG tablet Take 1 tablet (25 mg total) by mouth 2 (two) times daily with a meal. Patient not taking: Reported on 10/05/2016 08/08/16   Nita Sells, MD  cinacalcet (SENSIPAR) 30 MG tablet Take 30 mg by mouth every evening.     Historical Provider, MD  ciprofloxacin (CIPRO) 500 MG tablet Take 1 tablet (500 mg total) by mouth daily at 12 noon. 10/07/16 10/14/16  Reyne Dumas, MD  clonazePAM (KLONOPIN) 1 MG tablet Take 1 mg by mouth 2 (two) times daily.    Historical Provider, MD  cloNIDine (CATAPRES - DOSED IN MG/24 HR) 0.3 mg/24hr patch Place 1 patch (0.3 mg total) onto the skin every Monday. Patient not taking: Reported on  10/05/2016 08/19/16   Shawnee Knapp, MD  doxazosin (CARDURA) 2 MG tablet Take 1 tablet (2 mg total) by mouth daily. 08/08/16   Nita Sells, MD  famotidine (PEPCID) 20 MG tablet Take 1 tablet (20 mg total) by mouth 2 (two) times daily. Patient not taking: Reported on 10/05/2016 10/04/16   Varney Biles, MD  gabapentin (NEURONTIN) 100 MG capsule Take 1 capsule (100 mg total) by mouth at bedtime. Patient not taking: Reported on 10/05/2016 05/21/16   [name], DO  isosorbide mononitrate (IMDUR) 60 MG 24 hr tablet Take 1 tablet (60 mg total) by mouth daily. Patient not taking: Reported on 10/05/2016 08/08/16   Nita Sells, MD  methocarbamol (ROBAXIN) 500 MG tablet Take 1 tablet (500 mg total) by mouth every 6 (six) hours as needed for muscle spasms. 10/07/16 11/06/16  Reyne Dumas, MD  metroNIDAZOLE (FLAGYL) 500 MG tablet Take 1 tablet (500 mg total) by mouth every 8 (eight) hours. 10/07/16 10/14/16  Reyne Dumas, MD  omeprazole (PRILOSEC) 20 MG capsule Take 1 capsule (20 mg total) by mouth daily. Patient not taking: Reported on 10/05/2016 08/08/16   Nita Sells, MD  ondansetron (ZOFRAN ODT) 4 MG disintegrating tablet Take 1 tablet (4 mg total) by mouth every 8 (eight) hours as needed for nausea or vomiting. Patient not taking: Reported on 10/05/2016 09/27/16   Ezequiel Essex, MD  senna-docusate (SENOKOT-S) 8.6-50 MG tablet Take 1 tablet by mouth at bedtime as needed for mild constipation. Patient not taking: Reported on 10/05/2016 08/08/16   Nita Sells, MD  sevelamer carbonate (RENVELA) 800 MG tablet Take 1,600 mg by mouth 3 (three) times daily with meals.     Historical Provider, MD   Liver Function Tests  Recent Labs Lab 10/07/16 1131 10/08/16 0521 10/13/16 1529  AST  --   --  25  ALT  --   --  9*  ALKPHOS  --   --  148*  BILITOT  --   --  0.5  PROT  --   --  7.3  ALBUMIN 2.9* 2.8* 3.2*    Recent Labs Lab 10/13/16 1529  LIPASE <10*   CBC  Recent Labs Lab  10/13/16 1529  WBC 5.1  NEUTROABS 3.0  HGB 9.7*  HCT 31.0*  MCV 84.9  PLT 568   Basic Metabolic Panel  Recent Labs Lab 10/07/16 1131 10/08/16 0521 10/13/16 1529  NA 136 134* 136  K 4.9 5.0 6.8*  CL 96* 95* 97*  CO2 _0 GLUCOSE 99 68 100*  BUN 38* 16 41*  CREATININE 11.16* 6.59* 12.93*  CALCIUM 7.7* 7.8* 8.1*  PHOS 5.5* 4.4  --    Iron/TIBC/Ferritin/ %Sat    Component Value Date/Time   IRON 41 (L) 05/29/2016 1350   TIBC 190 (L) 05/29/2016 1350   FERRITIN 328 11/02/2015 0346   IRONPCTSAT 22 05/29/2016 1350    Vitals:   10/13/16 1527 10/13/16 1600 10/13/16 1630 10/13/16 1830  BP: (!) 142/87 (!) 157/106 (!) 158/102 (!) 155/100  Pulse: (!) 47 (!) 58 60 (!) 50  Resp: _1 Temp: 98.6 F (37 C)     TempSrc: Oral     SpO2: 100% 99% 99% 95%  Weight:      Height:       Exam Gen looks uncomfortable, lying on his side, holding abdomen No rash, cyanosis or gangrene Sclera anicteric, throat clear  No jvd or bruits Chest occ bibasilar rales, no wheezing  RRR no MRG Abd is slightly firm, tender mid and upper quads, dec'd BS, no rebound GU normal male MS no joint effusions or deformity Ext trace LE edema / no wounds or ulcers Neuro is alert, Ox 3 , nf L arm AVF+bruit  Na 136 K 6.8  CO2 26 BUN 41  Cr 12.93 CXR > early CHF EKG > sinus bradycardia, 50's, peaked T waves compared to Feb '18 EKG  Dialysis: MWF [place] [facility] 4h   2/2.25 bath  Hep 2000   LFA AVF    Assessment: 1. Hyperkalemia - believe his sinus brady may be d/t high K+.  Plan is to treat w IV insulin, Ca, glucose and NaHCO3 x 1.  Will plan on HD later tonight when spot becomes open.  2. SOB - mild pulm edema, get vol down w HD tonight 3. ESRD usual HD mwf 4. Abd pain - unclear cause, says he had CT yest at another facility. Lipase wnl, LFT"s ok.   5. HTN - get vol down, cont meds    Plan - as above  Kelly Splinter MD [place] pager [phone]   10/13/2016,  7:04 PM

## 2016-10-13 NOTE — ED Notes (Signed)
Lab called with critical K+:  2.8.  Reported to Will, PA.

## 2016-10-13 NOTE — ED Provider Notes (Signed)
[facility] DEPT Provider Note   CSN: [id number] Arrival date & time: 10/13/16  1516     History   Chief Complaint Chief Complaint  Patient presents with  . Abdominal Pain    HPI Frank Frank [name] is a 51 y.o. male.  Frank Frank [name] is a 51 y.o. Male with a history Frank [name] ESRD on Frank [name], Frank Frank [name], Frank Frank [name], Frank Frank [name] abdominal pain, nausea Frank vomiting for 5 days. He reports being seen at [facility] ER yesterday Frank had a normal CT scan Frank [name] his abdomen. He reports generalized abdominal pain that is worse in his epigastric area. He does report a history Frank [name] pancreatitis. He complains Frank [name] his ongoing pain for the past 5 days. It is unchanged since his ER visit yesterday. He reports developing some shortness Frank [name] breath today as well as tingling in his bilateral hands Frank feet. Denies any chest pain. He is on Frank [name] Monday, Wednesday Frank Friday. He last had Frank [name] Friday, or two days ago. He reports multiple episodes Frank [name] watery vomit over the course Frank [name] several days. He had normal bowel movement today. He makes no urine. He denies fevers, chest pain, increased cough, diarrhea, dizziness, lightheadedness, syncope, rashes.    The history is provided by the patient Frank medical records. No language interpreter was used.  Abdominal Pain   Associated symptoms include nausea Frank vomiting. Pertinent negatives include fever, diarrhea, Frank Frank [name] Frank headaches.    Past Medical History:  Diagnosis Date  . Anemia   . Anxiety   . Chronic combined systolic Frank diastolic Frank Frank [name] (congestive heart failure) (HCC)    a. EF 20-25% by echo in 08/2015 b. echo 10/2015: EF 35-40%, diffuse HK, severe LAE, moderate RAE, small pericardial effusion  . Complication Frank [name] anesthesia    itching, sore throat  . Depression   . Frank [name] patient ([place])   . ESRD (end stage renal disease) ([place])    due to HTN per patient, followed at [facility], s/p failed kidney transplant - Frank [name] Tue, Th, Sat   . Hyperkalemia 12/2015  . Hypertension   . Junctional rhythm    a. noted in 08/2015: hyperkalemic at that time  b. 12/2015: presented in junctional rhythm w/ K+ Frank [name] 6.6. Resolved with improvement Frank [name] K+ levels.  . Nonischemic cardiomyopathy ([place])    a. 08/2014: cath showing minimal CAD, but tortuous arteries noted.   . Personal history Frank [name] DVT (deep vein thrombosis)/ PE 05/26/2016   In Oct 2015 had small subsemental LUL PE w/o DVT (LE dopplers neg) Frank was felt to be HD cath related, treated w coumadin.  IN May 2016 had small vein DVT (acute/subacute) in the R basilic/ brachial veins Frank [name] the RUE, resumed on coumadin.  Had R sided HD cath at that time.    . Renal insufficiency   . Shortness Frank [name] breath   . Type II diabetes mellitus (HCC)    No history per patient, but remains under history as A1c would not be accurate given on Frank [name]    Patient Active Problem List   Diagnosis Date Noted  . Hypertensive emergency   . Dissection Frank [name] other specified artery ([place]) 08/04/2016  . Abdominal pain 08/04/2016  . End-stage renal disease on hemodialysis ([place])   . Subtherapeutic international normalized ratio (INR)   . Hypertension   . Problem with Frank [name] access ([place]) 06/22/2016  . ESRD (end stage renal disease) on Frank [name] ([place])   . ESRD (end stage renal disease) ([place]) 06/20/2016  . ESRD needing  Frank [name] ([place]) 06/14/2016  . Uremia 06/07/2016  . Hyperkalemia 05/29/2016  . Chronic back pain 05/29/2016  . GERD (gastroesophageal reflux disease) 05/29/2016  . Personal history Frank [name] DVT (deep vein thrombosis)/ PE 05/26/2016  . Bradycardia 03/31/2016  . Nonischemic cardiomyopathy ([place]) 01/09/2016  . Bilateral low back pain without sciatica   . Left renal mass 10/30/2015  . Frank Frank [name] 10/30/2015  . Acute on chronic combined systolic Frank diastolic congestive heart failure ([place]) 09/23/2015  . Hypertensive urgency 09/08/2015  . Adjustment disorder with mixed anxiety Frank depressed mood 08/20/2015  . Chronic  epididymitis 06/19/2015  . Groin pain, chronic, right 06/19/2015  . Incarcerated right inguinal hernia 02/16/2015  . Essential hypertension 01/02/2015  . Dyslipidemia   . ESRD on hemodialysis ([place])   . DM (diabetes mellitus), type 2, uncontrolled, with renal complications ([place])   . Type II diabetes mellitus ([place]) 05/08/2014  . Anemia Frank [name] chronic kidney failure 06/24/2013    Past Surgical History:  Procedure Laterality Date  . CAPD INSERTION    . CAPD REMOVAL    . INGUINAL HERNIA REPAIR Right 02/14/2015   Procedure: REPAIR INCARCERATED RIGHT INGUINAL HERNIA;  Surgeon: [name], MD;  Location: [facility];  Service: General;  Laterality: Right;  . INSERTION Frank [name] Frank [name] CATHETER Right 09/23/2015   Procedure: exchange Frank [name] Right internal Frank [name] Catheter.;  Surgeon: [name], MD;  Location: [facility];  Service: Vascular;  Laterality: Right;  . IR GENERIC HISTORICAL  07/16/2016   IR US GUIDE VASC ACCESS LEFT 07/16/2016 Corrie Mckusick, DO [facility]-INTERV RAD  . IR GENERIC HISTORICAL Left 07/16/2016   IR THROMBECTOMY AV FISTULA W/THROMBOLYSIS/PTA INC/SHUNT/IMG LEFT 07/16/2016 Corrie Mckusick, DO [facility]-INTERV RAD  . KIDNEY RECEIPIENT  2006   failed Frank started HD in March 2014  . LEFT HEART CATHETERIZATION WITH CORONARY ANGIOGRAM N/A 09/02/2014   Procedure: LEFT HEART CATHETERIZATION WITH CORONARY ANGIOGRAM;  Surgeon: [name], MD;  Location: [facility] CATH LAB;  Service: Cardiovascular;  Laterality: N/A;       Home Medications    Prior to Admission medications   Medication Sig Start Date End Date Taking? Authorizing Provider  acetaminophen-codeine (TYLENOL #3) 300-30 MG tablet Take 1 tablet by mouth every 6 (six) hours as needed. Patient not taking: Reported on 10/05/2016 08/09/16   Tresa Garter, MD  allopurinol (ZYLOPRIM) 100 MG tablet Take 100 mg by mouth daily.    Historical Provider, MD  ALPRAZolam [name]) 1 MG tablet Take 1 tablet (1 mg total) by mouth at bedtime as needed for sleep. Patient  not taking: Reported on 10/05/2016 08/13/16   Okey Regal, PA-C  amLODipine (NORVASC) 5 MG tablet Take 1 tablet (5 mg total) by mouth at bedtime. 08/08/16   Nita Sells, MD  atorvastatin (LIPITOR) 40 MG tablet Take 1 tablet (40 mg total) by mouth daily at 6 PM. Patient not taking: Reported on 10/05/2016 09/02/14   Barton Dubois, MD  calcitRIOL (ROCALTROL) 0.5 MCG capsule Take 2 capsules (1 mcg total) by mouth every Monday, Wednesday, Frank Friday with hemodialysis. 10/07/16 11/06/16  Reyne Dumas, MD  carvedilol (COREG) 25 MG tablet Take 1 tablet (25 mg total) by mouth 2 (two) times daily with a meal. Patient not taking: Reported on 10/05/2016 08/08/16   Nita Sells, MD  cinacalcet (SENSIPAR) 30 MG tablet Take 30 mg by mouth every evening.     Historical Provider, MD  ciprofloxacin (CIPRO) 500 MG tablet Take 1 tablet (500 mg total) by mouth daily at 12 noon. 10/07/16 10/14/16  Ascencion Dike  Abrol, MD  clonazePAM (KLONOPIN) 1 MG tablet Take 1 mg by mouth 2 (two) times daily.    Historical Provider, MD  cloNIDine (CATAPRES - DOSED IN MG/24 HR) 0.3 mg/24hr patch Place 1 patch (0.3 mg total) onto the skin every Monday. Patient not taking: Reported on 10/05/2016 08/19/16   Shawnee Knapp, MD  doxazosin (CARDURA) 2 MG tablet Take 1 tablet (2 mg total) by mouth daily. 08/08/16   Nita Sells, MD  famotidine (PEPCID) 20 MG tablet Take 1 tablet (20 mg total) by mouth 2 (two) times daily. Patient not taking: Reported on 10/05/2016 10/04/16   Varney Biles, MD  gabapentin (NEURONTIN) 100 MG capsule Take 1 capsule (100 mg total) by mouth at bedtime. Patient not taking: Reported on 10/05/2016 05/21/16   [name], DO  isosorbide mononitrate (IMDUR) 60 MG 24 hr tablet Take 1 tablet (60 mg total) by mouth daily. Patient not taking: Reported on 10/05/2016 08/08/16   Nita Sells, MD  methocarbamol (ROBAXIN) 500 MG tablet Take 1 tablet (500 mg total) by mouth every 6 (six) hours as needed for muscle spasms.  10/07/16 11/06/16  Reyne Dumas, MD  metroNIDAZOLE (FLAGYL) 500 MG tablet Take 1 tablet (500 mg total) by mouth every 8 (eight) hours. 10/07/16 10/14/16  Reyne Dumas, MD  omeprazole (PRILOSEC) 20 MG capsule Take 1 capsule (20 mg total) by mouth daily. Patient not taking: Reported on 10/05/2016 08/08/16   Nita Sells, MD  ondansetron (ZOFRAN ODT) 4 MG disintegrating tablet Take 1 tablet (4 mg total) by mouth every 8 (eight) hours as needed for nausea or vomiting. Patient not taking: Reported on 10/05/2016 09/27/16   Ezequiel Essex, MD  senna-docusate (SENOKOT-S) 8.6-50 MG tablet Take 1 tablet by mouth at bedtime as needed for mild Frank Frank [name]. Patient not taking: Reported on 10/05/2016 08/08/16   Nita Sells, MD  sevelamer carbonate (RENVELA) 800 MG tablet Take 1,600 mg by mouth 3 (three) times daily with meals.     Historical Provider, MD    Family History Family History  Problem Relation Age Frank [name] Onset  . Hypertension Other     Social History Social History  Substance Use Topics  . Smoking status: Former Smoker    Packs/day: 0.00    Years: 1.00    Types: Cigarettes  . Smokeless tobacco: Never Used     Comment: quit Jan 2014  . Alcohol use No     Allergies   Butalbital-apap-caffeine; Ferrlecit [na ferric gluc cplx in sucrose]; Minoxidil; Tylenol [acetaminophen]; Frank Darvocet [propoxyphene n-acetaminophen]   Review Frank [name] Systems Review Frank [name] Systems  Constitutional: Negative for chills Frank fever.  HENT: Negative for congestion Frank sore throat.   Eyes: Negative for visual disturbance.  Respiratory: Negative for cough, shortness Frank [name] breath Frank wheezing.   Cardiovascular: Negative for chest pain.  Gastrointestinal: Positive for abdominal pain, nausea Frank vomiting. Negative for blood in stool, Frank Frank [name] Frank diarrhea.  Genitourinary: Negative for penile pain Frank testicular pain.       Makes no urine   Musculoskeletal: Negative for back pain Frank neck pain.  Skin: Negative for  rash.  Neurological: Negative for dizziness, syncope, weakness, light-headedness, numbness Frank headaches.     Physical Exam Updated Vital Signs BP (!) 152/93   Pulse (!) 49   Temp 98.6 F (37 C) (Oral)   Resp 12   Ht _0  (1.88 m)   Wt 74.8 kg   SpO2 96%   BMI 21.18 kg/m   Physical Exam  Constitutional: He is  oriented to person, place, Frank time. He appears well-developed Frank well-nourished. No distress.  Nontoxic appearing.  HENT:  Head: Normocephalic Frank atraumatic.  Mouth/Throat: Oropharynx is clear Frank moist.  Mucous membranes are moist.  Eyes: Conjunctivae are normal. Pupils are equal, round, Frank reactive to light. Right eye exhibits no discharge. Left eye exhibits no discharge.  Neck: Neck supple.  Cardiovascular: Normal rate, regular rhythm, normal heart sounds Frank intact distal pulses.  Exam reveals no gallop Frank no friction rub.   No murmur heard. HR is 62  Pulmonary/Chest: Effort normal Frank breath sounds normal. No respiratory distress. He has no wheezes. He has no rales.  Lungs clear to auscultation bilaterally.  Abdominal: Soft. Bowel sounds are normal. He exhibits no distension Frank no mass. There is tenderness. There is no rebound. No hernia.  Abdomen is soft. Bowel sounds are present. Patient has generalized abdominal tenderness palpation is worse in his epigastric area. No peritoneal signs.  Musculoskeletal: He exhibits no edema or tenderness.  No lower extremity edema or tenderness.  Lymphadenopathy:    He has no cervical adenopathy.  Neurological: He is alert Frank oriented to person, place, Frank time. Coordination normal.  Skin: Skin is warm Frank dry. No rash noted. He is not diaphoretic. No erythema. No pallor.  Psychiatric: He has a normal mood Frank affect. His behavior is normal.  Nursing note Frank vitals reviewed.    ED Treatments / Results  Labs (all labs ordered are listed, but only abnormal results are displayed) Labs Reviewed  CBC WITH  DIFFERENTIAL/PLATELET - Abnormal; Notable for the following:       Result Value   RBC 3.65 (*)    Hemoglobin 9.7 (*)    HCT 31.0 (*)    RDW 17.3 (*)    All other components within normal limits  COMPREHENSIVE METABOLIC PANEL - Abnormal; Notable for the following:    Potassium 6.8 (*)    Chloride 97 (*)    Glucose, Bld 100 (*)    BUN 41 (*)    Creatinine, Ser 12.93 (*)    Calcium 8.1 (*)    Albumin 3.2 (*)    ALT 9 (*)    Alkaline Phosphatase 148 (*)    GFR calc non Af Amer 4 (*)    GFR calc Af Amer 4 (*)    All other components within normal limits  LIPASE, BLOOD - Abnormal; Notable for the following:    Lipase <10 (*)    All other components within normal limits  GLUCOSE, RANDOM  I-STAT TROPOININ, ED    EKG  EKG Interpretation  Date/Time:  Sunday October 13 2016 15:24:51 EDT Ventricular Rate:  40 PR Interval:  210 QRS Duration: 96 QT Interval:  548 QTC Calculation: 446 R Axis:   -11 Text Interpretation:  Marked sinus bradycardia with sinus arrhythmia with 1st degree A-V block Left ventricular hypertrophy with repolarization abnormality Abnormal ECG Confirmed by Lacinda Axon  MD, Prate[place] ([id number]) on 10/13/2016 6:10:23 PM       Radiology Dg Chest 2 View  Result Date: 10/13/2016 CLINICAL DATA:  SOB Frank generalized abdominal pain with nausea Frank vomiting x 3-4 days. (Pt. Not compliant - thus 2 images with pt. Artifacts.) Hx Frank [name] Frank [name] ESRD, NICM with diastolic Frank systolic Frank Frank [name], HTN, multiple admission to the hospital for abdominal pain. EXAM: CHEST  2 VIEW COMPARISON:  09/26/2016 FINDINGS: The heart is mildly enlarged. There is mild prominence Frank [name] interstitial markings, consistent with mild interstitial pulmonary edema. Suspect small left pleural  effusion. No focal consolidations. IMPRESSION: Cardiomegaly Frank mild interstitial edema. Electronically Signed   By: Nolon Nations M.D.   On: 10/13/2016 17:15   Dg Abdomen 1 View  Result Date: 10/13/2016 CLINICAL DATA:  Shortness Frank [name] breath,  generalized abdominal pain EXAM: ABDOMEN - 1 VIEW COMPARISON:  09/26/2016 FINDINGS: There is normal small bowel gas pattern. Some colonic gas Frank stool noted in right colon Frank transverse colon. Colonic gas noted within rectum. IMPRESSION: Normal small bowel gas pattern. Some colonic stool Frank gas noted in right colon Frank transverse colon. Electronically Signed   By: Lahoma Crocker M.D.   On: 10/13/2016 17:16    Procedures Procedures (including critical care time)  Medications Ordered in ED Medications  sodium polystyrene (KAYEXALATE) 15 GM/60ML suspension 60 g (60 g Oral Not Given 10/13/16 1846)  calcium gluconate 1 g in sodium chloride 0.9 % 100 mL IVPB (1 g Intravenous New Bag/Given 10/13/16 1956)  albuterol (PROVENTIL) (2.5 MG/3ML) 0.083% nebulizer solution 10 mg (not administered)  promethazine (PHENERGAN) injection 12.5 mg (not administered)  HYDROmorphone (DILAUDID) injection 1 mg (not administered)  pantoprazole (PROTONIX) injection 40 mg (not administered)  morphine 4 MG/ML injection 4 mg (4 mg Intramuscular Given 10/13/16 1741)  dicyclomine (BENTYL) capsule 10 mg (10 mg Oral Given 10/13/16 1842)  insulin aspart (novoLOG) injection 10 Units (10 Units Intravenous Given 10/13/16 1955)  dextrose 50 % solution 50 mL (50 mLs Intravenous Given 10/13/16 1955)  sodium bicarbonate injection 50 mEq (50 mEq Intravenous Given 10/13/16 1955)     Initial Impression / Assessment Frank Plan / ED Course  I have reviewed the triage vital signs Frank the nursing notes.  Pertinent labs & imaging results that were available during my care Frank [name] the patient were reviewed by me Frank considered in my medical decision making (see chart for details).  Clinical Course as Frank [name] Oct 14 2010  Sun Oct 13, 2016  1739 I consulted with Dr. Jonnie Finner who would like me to give the patient 60 g Frank [name] Kayexalate Frank admit the patient to medicine will dialyze the patient in the morning.  [WD]    Clinical Course User Index [WD] Waynetta Pean, PA-C   This is a 51 y.o. Male with a history Frank [name] ESRD on Frank [name], Frank Frank [name], Frank Frank [name], Frank Frank [name] abdominal pain, nausea Frank vomiting for 5 days. He reports being seen at [facility] ER yesterday Frank had a normal CT scan Frank [name] his abdomen. He reports generalized abdominal pain that is worse in his epigastric area. He does report a history Frank [name] pancreatitis. He complains Frank [name] his ongoing pain for the past 5 days. It is unchanged since his ER visit yesterday. He reports developing some shortness Frank [name] breath today as well as tingling in his bilateral hands Frank feet. Denies any chest pain. He is on Frank [name] Monday, Wednesday Frank Friday. He last had Frank [name] Friday, or two days ago. He reports multiple episodes Frank [name] watery vomit over the course Frank [name] several days. He had normal bowel movement today. He makes no urine. On exam the patient is afebrile nontoxic appearing. He appears comfortable on exam. His lungs are clear to auscultation bilaterally. No increased work Frank [name] breathing. His abdomen is soft Frank has generalized abdominal tenderness to palpation. Is no lower extremity edema or tenderness. On chart review patient had a CT scan yesterday at [facility] that was unremarkable. This is reassuring.  Chest x-ray shows cardiomegaly Frank mild interstitial edema. I suspect this is contributing to  his shortness Frank [name] breath Frank likely due to him needing Frank [name]. Plain film Frank [name] his abdomen shows normal small bowel gas pattern. Troponin is not elevated. CBC is remarkable for hemoglobin Frank [name] 9.7 which is around his baseline. Lipase is not elevated. CMP is remarkable for a potassium Frank [name] 6.8. EKG without signs Frank [name] hyperkalemia.   I consulted with Dr. Jonnie Finner who would like me to give the patient 60 g Frank [name] Kayexalate Frank admit the patient to medicine will dialyze the patient in the morning.  At reevaluation patient is tolerating PO. Will have him take Kayexalate Frank admit to medicine. Patient  agrees with plan.   I consulted with Dr. Olevia Bowens who accepted the patient for admission.   This patient was discussed with Frank evaluated by Dr. Lacinda Axon who agrees with assessment Frank plan.   Final Clinical Impressions(s) / ED Diagnoses   Final diagnoses:  Hyperkalemia  ESRD (end stage renal disease) ([place])  Generalized abdominal pain  Intractable vomiting with nausea, unspecified vomiting type  Shortness Frank [name] breath    New Prescriptions New Prescriptions   No medications on file     Waynetta Pean, PA-C 10/13/16 2017    Nat Christen, MD 10/15/16 1358

## 2016-10-14 ENCOUNTER — Encounter (HOSPITAL_COMMUNITY): Payer: Self-pay | Admitting: General Practice

## 2016-10-14 DIAGNOSIS — M549 Dorsalgia, unspecified: Secondary | ICD-10-CM | POA: Diagnosis present

## 2016-10-14 DIAGNOSIS — Z79899 Other long term (current) drug therapy: Secondary | ICD-10-CM | POA: Diagnosis not present

## 2016-10-14 DIAGNOSIS — Z5181 Encounter for therapeutic drug level monitoring: Secondary | ICD-10-CM | POA: Diagnosis not present

## 2016-10-14 DIAGNOSIS — Z86718 Personal history of other venous thrombosis and embolism: Secondary | ICD-10-CM | POA: Diagnosis not present

## 2016-10-14 DIAGNOSIS — F329 Major depressive disorder, single episode, unspecified: Secondary | ICD-10-CM | POA: Diagnosis present

## 2016-10-14 DIAGNOSIS — N186 End stage renal disease: Secondary | ICD-10-CM | POA: Diagnosis not present

## 2016-10-14 DIAGNOSIS — R001 Bradycardia, unspecified: Secondary | ICD-10-CM | POA: Diagnosis present

## 2016-10-14 DIAGNOSIS — M47816 Spondylosis without myelopathy or radiculopathy, lumbar region: Secondary | ICD-10-CM | POA: Diagnosis not present

## 2016-10-14 DIAGNOSIS — I429 Cardiomyopathy, unspecified: Secondary | ICD-10-CM | POA: Diagnosis present

## 2016-10-14 DIAGNOSIS — K5901 Slow transit constipation: Secondary | ICD-10-CM | POA: Diagnosis not present

## 2016-10-14 DIAGNOSIS — K219 Gastro-esophageal reflux disease without esophagitis: Secondary | ICD-10-CM | POA: Diagnosis present

## 2016-10-14 DIAGNOSIS — Z992 Dependence on renal dialysis: Secondary | ICD-10-CM

## 2016-10-14 DIAGNOSIS — Y830 Surgical operation with transplant of whole organ as the cause of abnormal reaction of the patient, or of later complication, without mention of misadventure at the time of the procedure: Secondary | ICD-10-CM | POA: Diagnosis present

## 2016-10-14 DIAGNOSIS — D631 Anemia in chronic kidney disease: Secondary | ICD-10-CM | POA: Diagnosis not present

## 2016-10-14 DIAGNOSIS — T8612 Kidney transplant failure: Secondary | ICD-10-CM | POA: Diagnosis present

## 2016-10-14 DIAGNOSIS — I120 Hypertensive chronic kidney disease with stage 5 chronic kidney disease or end stage renal disease: Secondary | ICD-10-CM | POA: Diagnosis not present

## 2016-10-14 DIAGNOSIS — I10 Essential (primary) hypertension: Secondary | ICD-10-CM | POA: Diagnosis not present

## 2016-10-14 DIAGNOSIS — Z8249 Family history of ischemic heart disease and other diseases of the circulatory system: Secondary | ICD-10-CM | POA: Diagnosis not present

## 2016-10-14 DIAGNOSIS — E1165 Type 2 diabetes mellitus with hyperglycemia: Secondary | ICD-10-CM | POA: Diagnosis present

## 2016-10-14 DIAGNOSIS — I5042 Chronic combined systolic (congestive) and diastolic (congestive) heart failure: Secondary | ICD-10-CM | POA: Diagnosis present

## 2016-10-14 DIAGNOSIS — N189 Chronic kidney disease, unspecified: Secondary | ICD-10-CM | POA: Diagnosis not present

## 2016-10-14 DIAGNOSIS — N2581 Secondary hyperparathyroidism of renal origin: Secondary | ICD-10-CM | POA: Diagnosis present

## 2016-10-14 DIAGNOSIS — I161 Hypertensive emergency: Secondary | ICD-10-CM | POA: Diagnosis present

## 2016-10-14 DIAGNOSIS — K5900 Constipation, unspecified: Secondary | ICD-10-CM | POA: Diagnosis present

## 2016-10-14 DIAGNOSIS — E875 Hyperkalemia: Secondary | ICD-10-CM | POA: Diagnosis not present

## 2016-10-14 DIAGNOSIS — I132 Hypertensive heart and chronic kidney disease with heart failure and with stage 5 chronic kidney disease, or end stage renal disease: Secondary | ICD-10-CM | POA: Diagnosis present

## 2016-10-14 DIAGNOSIS — E1122 Type 2 diabetes mellitus with diabetic chronic kidney disease: Secondary | ICD-10-CM | POA: Diagnosis present

## 2016-10-14 DIAGNOSIS — F4323 Adjustment disorder with mixed anxiety and depressed mood: Secondary | ICD-10-CM | POA: Diagnosis present

## 2016-10-14 DIAGNOSIS — E785 Hyperlipidemia, unspecified: Secondary | ICD-10-CM | POA: Diagnosis present

## 2016-10-14 DIAGNOSIS — G894 Chronic pain syndrome: Secondary | ICD-10-CM | POA: Diagnosis not present

## 2016-10-14 DIAGNOSIS — F419 Anxiety disorder, unspecified: Secondary | ICD-10-CM | POA: Diagnosis present

## 2016-10-14 DIAGNOSIS — G8929 Other chronic pain: Secondary | ICD-10-CM | POA: Diagnosis present

## 2016-10-14 DIAGNOSIS — R0602 Shortness of breath: Secondary | ICD-10-CM | POA: Diagnosis not present

## 2016-10-14 LAB — CBC
HEMATOCRIT: 31.1 % — AB (ref 39.0–52.0)
Hemoglobin: 10.1 g/dL — ABNORMAL LOW (ref 13.0–17.0)
MCH: 27.3 pg (ref 26.0–34.0)
MCHC: 32.5 g/dL (ref 30.0–36.0)
MCV: 84.1 fL (ref 78.0–100.0)
PLATELETS: 120 10*3/uL — AB (ref 150–400)
RBC: 3.7 MIL/uL — AB (ref 4.22–5.81)
RDW: 17.3 % — ABNORMAL HIGH (ref 11.5–15.5)
WBC: 4.1 10*3/uL (ref 4.0–10.5)

## 2016-10-14 LAB — BASIC METABOLIC PANEL
Anion gap: 14 (ref 5–15)
BUN: 19 mg/dL (ref 6–20)
CHLORIDE: 94 mmol/L — AB (ref 101–111)
CO2: 26 mmol/L (ref 22–32)
Calcium: 8.3 mg/dL — ABNORMAL LOW (ref 8.9–10.3)
Creatinine, Ser: 7.8 mg/dL — ABNORMAL HIGH (ref 0.61–1.24)
GFR, EST AFRICAN AMERICAN: 8 mL/min — AB (ref >60)
GFR, EST NON AFRICAN AMERICAN: 7 mL/min — AB (ref >60)
Glucose, Bld: 58 mg/dL — ABNORMAL LOW (ref 65–99)
POTASSIUM: 5 mmol/L (ref 3.5–5.1)
SODIUM: 134 mmol/L — AB (ref 135–145)

## 2016-10-14 LAB — GLUCOSE, CAPILLARY: Glucose-Capillary: 73 mg/dL (ref 65–99)

## 2016-10-14 MED ORDER — BISACODYL 10 MG RE SUPP: 10.0000 mg | Freq: Once | RECTAL | Status: DC

## 2016-10-14 MED ORDER — SENNOSIDES-DOCUSATE SODIUM 8.6-50 MG PO TABS
2.0000 | ORAL_TABLET | Freq: Two times a day (BID) | ORAL | Status: DC
  Administered 2016-10-14 (×2): 2 via ORAL
  Filled 2016-10-14 (×2): qty 2

## 2016-10-14 MED ORDER — PANTOPRAZOLE SODIUM 40 MG PO TBEC
40.0000 mg | DELAYED_RELEASE_TABLET | Freq: Two times a day (BID) | ORAL | Status: DC
  Administered 2016-10-14: 40 mg via ORAL
  Filled 2016-10-14: qty 1

## 2016-10-14 MED ORDER — HYDROMORPHONE HCL 1 MG/ML IJ SOLN
INTRAMUSCULAR | Status: AC
  Administered 2016-10-14: 1 mg via INTRAVENOUS
  Filled 2016-10-14: qty 1

## 2016-10-14 MED ORDER — CALCITRIOL 0.5 MCG PO CAPS
ORAL_CAPSULE | ORAL | Status: AC
  Filled 2016-10-14: qty 2

## 2016-10-14 MED ORDER — INSULIN ASPART 100 UNIT/ML ~~LOC~~ SOLN: 0.0000 [IU] | SUBCUTANEOUS | Status: DC

## 2016-10-14 MED ORDER — LACTULOSE 10 GM/15ML PO SOLN
20.0000 g | Freq: Two times a day (BID) | ORAL | Status: DC
  Filled 2016-10-14 (×2): qty 30

## 2016-10-14 NOTE — Care Management Obs Status (Signed)
[place] NOTIFICATION   Patient Details  Name: ALGERNON MUNDIE MRN: [id number] Date of Birth: 04-29-1966   Medicare Observation Status Notification Given:  Yes    Jamyson Jirak, Rory Percy, RN 10/14/2016, 11:39 AM

## 2016-10-14 NOTE — Progress Notes (Signed)
PROGRESS NOTE    Frank [name]  JGO:[id number] DOB: Jul 21, 1966 DOA: 10/13/2016 PCP: [name] Patient   Outpatient Specialists:     Brief Narrative:  RALF KONOPKA is a 51 y.o. male with medical history significant of ESRD on hemodialysis, anemia renal disease, anxiety, depression, chronic combined systolic and diastolic CHF, hypertension, hyperlipidemia, type 2 diabetes, pancreatitis in the past who is coming to the emergency department with complaints of abdominal pain for the past 3-4 days.  Per patient, he has been having pain in his epigastric area for the past few weeks. However, he states that over the last few days his pain has increased significantly area. He has had multiple episodes of emesis (at least 30) over the past 2 days. He was seen in the [facility] ED yesterday, where he received 2 L of IV fluids, analgesics, antiemetics and a CT scan abdomen/pelvis that shows constipation.   Assessment & Plan:   Principal Problem:   Hyperkalemia Active Problems:   Anemia of chronic kidney failure   DM (diabetes mellitus), type 2, uncontrolled, with renal complications (HCC)   Dyslipidemia   Essential hypertension   Adjustment disorder with mixed anxiety and depressed mood   GERD (gastroesophageal reflux disease)   End-stage renal disease on hemodialysis (HCC)   Abdominal pain   Abdominal pain due to constipation -bowel regimen ordered -patient on chronic narcotics at home -avoid pain medications here  -continue cipro/flagyl - recently d/c from hospital on these meds-- stop date 3/27 -lipase negative -last UDS + for marijuana   Nausea -due to  Constipation  ESRD -had HD last PM -due again today -resume home meds  Anemia of CKD -per nephrology  DM -SSI q 4 while NPO  HTN -continue home meds     DVT prophylaxis:  SCD's  Code Status: Full Code   Family Communication:   Disposition Plan:     Consultants:   Renal for HD      Subjective: Still  with nausea  Objective: Vitals:   10/14/16 0000 10/14/16 0030 10/14/16 0100 10/14/16 0130  BP: (!) 174/84 (!) 167/108 (!) 159/93 (!) 156/90  Pulse: (!) 53 74 64 61  Resp: _0 Temp:      TempSrc:      SpO2:      Weight:      Height:        Intake/Output Summary (Last 24 hours) at 10/14/16 0834 Last data filed at 10/14/16 0600  Gross per 24 hour  Intake              300 ml  Output                0 ml  Net              300 ml   Filed Weights   10/13/16 1525 10/13/16 2215  Weight: 74.8 kg (165 lb) 74.8 kg (164 lb 14.5 oz)    Examination:  General exam: Appears calm and comfortable- NAD  Respiratory system: Clear to auscultation. Respiratory effort normal. Cardiovascular system: S1 & S2 heard, RRR. No JVD, murmurs, rubs, gallops or clicks. No pedal edema. Gastrointestinal system: diminished-- patient states tender to palpation Central nervous system: Alert and oriented. No focal neurological deficits. Skin: No rashes, lesions or ulcers Psychiatry: Judgement and insight appear normal. Mood & affect appropriate.     Data Reviewed: I have personally reviewed following labs and imaging studies  CBC:  Recent Labs Lab 10/13/16  1529 10/14/16 0718  WBC 5.1 4.1  NEUTROABS 3.0  --   HGB 9.7* 10.1*  HCT 31.0* 31.1*  MCV 84.9 84.1  PLT 218 883*   Basic Metabolic Panel:  Recent Labs Lab 10/07/16 1131 10/08/16 0521 10/13/16 1529 10/13/16 2129 10/14/16 0718  NA 136 134* 136  --  134*  K 4.9 5.0 6.8*  --  5.0  CL 96* 95* 97*  --  94*  CO2 _0 --  26  GLUCOSE 99 68 100* 71 58*  BUN 38* 16 41*  --  19  CREATININE 11.16* 6.59* 12.93*  --  7.80*  CALCIUM 7.7* 7.8* 8.1*  --  8.3*  PHOS 5.5* 4.4  --   --   --    GFR: Estimated Creatinine Clearance: 11.7 mL/min (A) (by C-G formula based on SCr of 7.8 mg/dL (H)). Liver Function Tests:  Recent Labs Lab 10/07/16 1131 10/08/16 0521 10/13/16 1529  AST  --   --  25  ALT  --   --  9*  ALKPHOS  --   --   148*  BILITOT  --   --  0.5  PROT  --   --  7.3  ALBUMIN 2.9* 2.8* 3.2*    Recent Labs Lab 10/13/16 1529  LIPASE <10*   No results for input(s): AMMONIA in the last 168 hours. Coagulation Profile: No results for input(s): INR, PROTIME in the last 168 hours. Cardiac Enzymes: No results for input(s): CKTOTAL, CKMB, CKMBINDEX, TROPONINI in the last 168 hours. BNP (last 3 results) No results for input(s): PROBNP in the last 8760 hours. HbA1C: No results for input(s): HGBA1C in the last 72 hours. CBG: No results for input(s): GLUCAP in the last 168 hours. Lipid Profile: No results for input(s): CHOL, HDL, LDLCALC, TRIG, CHOLHDL, LDLDIRECT in the last 72 hours. Thyroid Function Tests: No results for input(s): TSH, T4TOTAL, FREET4, T3FREE, THYROIDAB in the last 72 hours. Anemia Panel: No results for input(s): VITAMINB12, FOLATE, FERRITIN, TIBC, IRON, RETICCTPCT in the last 72 hours. Urine analysis:    Component Value Date/Time   COLORURINE YELLOW 10/18/2013 0419   APPEARANCEUR CLEAR 10/18/2013 0419   LABSPEC 1.008 10/18/2013 0419   PHURINE 8.5 (H) 10/18/2013 0419   GLUCOSEU 100 (A) 10/18/2013 0419   HGBUR TRACE (A) 10/18/2013 0419   BILIRUBINUR NEGATIVE 10/18/2013 0419   KETONESUR NEGATIVE 10/18/2013 0419   PROTEINUR 100 (A) 10/18/2013 0419   UROBILINOGEN 0.2 10/18/2013 0419   NITRITE NEGATIVE 10/18/2013 0419   LEUKOCYTESUR NEGATIVE 10/18/2013 0419    )No results found for this or any previous visit (from the past 240 hour(s)).    Anti-infectives    Start     Dose/Rate Route Frequency Ordered Stop   10/13/16 2300  valACYclovir (VALTREX) tablet 500 mg  Status:  Discontinued     500 mg Oral 2 times daily 10/13/16 2256 10/13/16 2303   10/13/16 2200  metroNIDAZOLE (FLAGYL) IVPB 500 mg     500 mg 100 mL/hr over 60 Minutes Intravenous Every 8 hours 10/13/16 2048     10/13/16 2100  ciprofloxacin (CIPRO) IVPB 400 mg     400 mg 200 mL/hr over 60 Minutes Intravenous Every 24  hours 10/13/16 2048         Radiology Studies: Dg Chest 2 View  Result Date: 10/13/2016 CLINICAL DATA:  SOB and generalized abdominal pain with nausea and vomiting x 3-4 days. (Pt. Not compliant - thus 2 images with pt. Artifacts.) Hx of  of ESRD, NICM with diastolic and systolic CHF, HTN, multiple admission to the hospital for abdominal pain. EXAM: CHEST  2 VIEW COMPARISON:  09/26/2016 FINDINGS: The heart is mildly enlarged. There is mild prominence of interstitial markings, consistent with mild interstitial pulmonary edema. Suspect small left pleural effusion. No focal consolidations. IMPRESSION: Cardiomegaly and mild interstitial edema. Electronically Signed   By: Nolon Nations M.D.   On: 10/13/2016 17:15   Dg Abdomen 1 View  Result Date: 10/13/2016 CLINICAL DATA:  Shortness of breath, generalized abdominal pain EXAM: ABDOMEN - 1 VIEW COMPARISON:  09/26/2016 FINDINGS: There is normal small bowel gas pattern. Some colonic gas and stool noted in right colon and transverse colon. Colonic gas noted within rectum. IMPRESSION: Normal small bowel gas pattern. Some colonic stool and gas noted in right colon and transverse colon. Electronically Signed   By: Lahoma Crocker M.D.   On: 10/13/2016 17:16        Scheduled Meds: . albuterol  10 mg Nebulization Once  . allopurinol  100 mg Oral Daily  . amLODipine  5 mg Oral QHS  . bisacodyl  10 mg Rectal Once  . calcitRIOL  1 mcg Oral Q M,W,F-HD  . cinacalcet  30 mg Oral QPM  . ciprofloxacin  400 mg Intravenous Q24H  . clonazePAM  1 mg Oral BID  . doxazosin  2 mg Oral Daily  . heparin  2,000 Units Dialysis Once in dialysis  . heparin  5,000 Units Subcutaneous Q8H  . lactulose  20 g Oral BID  . metronidazole  500 mg Intravenous Q8H  . pantoprazole  40 mg Oral BID AC  . senna-docusate  2 tablet Oral BID   Continuous Infusions:   LOS: 0 days    Time spent: 25 min    Hardin, DO [facility] Pager [phone]  If 7PM-7AM,  please contact night-coverage [email or web address] Password [facility] 10/14/2016, 8:34 AM

## 2016-10-14 NOTE — Progress Notes (Signed)
Subjective:  Tolerated  HD  Last  Pm /feels better but still some abd discomfort / noted admit team treating constipation   Objective Vital signs in last 24 hours: Vitals:   10/14/16 0000 10/14/16 0030 10/14/16 0100 10/14/16 0130  BP: (!) 174/84 (!) 167/108 (!) 159/93 (!) 156/90  Pulse: (!) 53 74 64 61  Resp: _0 Temp:      TempSrc:      SpO2:      Weight:      Height:       Weight change:   Physical Exam: General: alert AAM OX3 NAD Heart: RRR, No Rub, Mur, Gallop Lungs: CTA  Abdomen: BS Pos. , soft , Tender all quads, Nondistended Extremities:  No pedal edema Dialysis Access: pos bruit LUA AVF    OP Dialysis: MWF [place] [facility] 4h 2/2.25 bath Hep 2000 LFA AVF EWD 73.5 Mircera 177mg q2weeks  Last on  Calcitriol 1.0 mch po q hd  Heparin 2000  Problem/Plan: 1.  Hyperkalemia- HD last pm , HD today to keep on MWF schedule , check Bmet pre hd 2. SOB/ Mild Pulm Edema  , had 4 l uf last pm , uf today to edw, no sob this am  3. ESRD - MWF 4. Abd pain- Dr. VHoyle Sauer, constipation  5. Anemia - hgb 10.1  ESA  Check schedule from recent admit  6. Secondary hyperparathyroidism -po vit d on hd , binder 7. HTN/volume - uf  On HD , Needs Compliance with BP Meds  8. Nonadherent  to HD attendance as OP  And op BP meds   DErnest Haber PA-C [id number]/26/2018,8:22 AM  LOS: 0 days    Renal Attending; I agree with note as articulated above.  Harlee Pursifull C  Labs: Basic Metabolic Panel:  Recent Labs Lab 10/07/16 1131 10/08/16 0521 10/13/16 1529 10/13/16 2129  NA 136 134* 136  --   K 4.9 5.0 6.8*  --   CL 96* 95* 97*  --   CO2 _1 --   GLUCOSE 99 68 100* 71  BUN 38* 16 41*  --   CREATININE 11.16* 6.59* 12.93*  --   CALCIUM 7.7* 7.8* 8.1*  --   PHOS 5.5* 4.4  --   --    Liver Function Tests:  Recent Labs Lab 10/07/16 1131 10/08/16 0521 10/13/16 1529  AST  --   --  25  ALT  --   --  9*  ALKPHOS  --   --   148*  BILITOT  --   --  0.5  PROT  --   --  7.3  ALBUMIN 2.9* 2.8* 3.2*    Recent Labs Lab 10/13/16 1529  LIPASE <10*   No results for input(s): AMMONIA in the last 168 hours. CBC:  Recent Labs Lab 10/13/16 1529 10/14/16 0718  WBC 5.1 4.1  NEUTROABS 3.0  --   HGB 9.7* 10.1*  HCT 31.0* 31.1*  MCV 84.9 84.1  PLT 218 120*   Cardiac Enzymes: No results for input(s): CKTOTAL, CKMB, CKMBINDEX, TROPONINI in the last 168 hours. CBG: No results for input(s): GLUCAP in the last 168 hours.  Studies/Results: Dg Chest 2 View  Result Date: 10/13/2016 CLINICAL DATA:  SOB and generalized abdominal pain with nausea and vomiting x 3-4 days. (Pt. Not compliant - thus 2 images with pt. Artifacts.) Hx of of ESRD, NICM with diastolic and systolic CHF, HTN, multiple admission  to the hospital for abdominal pain. EXAM: CHEST  2 VIEW COMPARISON:  09/26/2016 FINDINGS: The heart is mildly enlarged. There is mild prominence of interstitial markings, consistent with mild interstitial pulmonary edema. Suspect small left pleural effusion. No focal consolidations. IMPRESSION: Cardiomegaly and mild interstitial edema. Electronically Signed   By: Nolon Nations M.D.   On: 10/13/2016 17:15   Dg Abdomen 1 View  Result Date: 10/13/2016 CLINICAL DATA:  Shortness of breath, generalized abdominal pain EXAM: ABDOMEN - 1 VIEW COMPARISON:  09/26/2016 FINDINGS: There is normal small bowel gas pattern. Some colonic gas and stool noted in right colon and transverse colon. Colonic gas noted within rectum. IMPRESSION: Normal small bowel gas pattern. Some colonic stool and gas noted in right colon and transverse colon. Electronically Signed   By: Lahoma Crocker M.D.   On: 10/13/2016 17:16   Medications:  . albuterol  10 mg Nebulization Once  . allopurinol  100 mg Oral Daily  . amLODipine  5 mg Oral QHS  . bisacodyl  10 mg Rectal Once  . calcitRIOL  1 mcg Oral Q M,W,F-HD  . cinacalcet  30 mg Oral QPM  . ciprofloxacin   400 mg Intravenous Q24H  . clonazePAM  1 mg Oral BID  . doxazosin  2 mg Oral Daily  . heparin  2,000 Units Dialysis Once in dialysis  . heparin  5,000 Units Subcutaneous Q8H  . lactulose  20 g Oral BID  . metronidazole  500 mg Intravenous Q8H  . pantoprazole  40 mg Oral BID AC  . senna-docusate  2 tablet Oral BID

## 2016-10-15 DIAGNOSIS — F4323 Adjustment disorder with mixed anxiety and depressed mood: Secondary | ICD-10-CM

## 2016-10-15 DIAGNOSIS — M47816 Spondylosis without myelopathy or radiculopathy, lumbar region: Secondary | ICD-10-CM | POA: Diagnosis not present

## 2016-10-15 DIAGNOSIS — I10 Essential (primary) hypertension: Secondary | ICD-10-CM | POA: Diagnosis not present

## 2016-10-15 DIAGNOSIS — G894 Chronic pain syndrome: Secondary | ICD-10-CM | POA: Diagnosis not present

## 2016-10-15 LAB — GLUCOSE, CAPILLARY: Glucose-Capillary: 133 mg/dL — ABNORMAL HIGH (ref 65–99)

## 2016-10-15 NOTE — Progress Notes (Addendum)
Date: 10/15/2016 Patient: Frank [name] Admitted: 10/13/2016  3:30 PM Attending Provider: Dr Carlena Sax made the decision to leave the department against the advice of Dr Eliseo Squires.  He has been informed and understands the inherent risks, including death.  He has decided to accept the responsibility for this decision. [name] and all necessary parties have been advised that he may return for further evaluation or treatment. [name] had current vital signs as follows:  Blood pressure 134/83, pulse 65, temperature 97.8 F (36.6 C), resp. rate 19, height 6' 2" (1.88 m), weight 73 kg (160 lb 15 oz), SpO2 99 %.   HURBERT DURAN  has not signed the Leaving Against Medical Advice form prior to leaving the department.  Vinie Sill 10/15/2016

## 2016-10-16 DIAGNOSIS — D509 Iron deficiency anemia, unspecified: Secondary | ICD-10-CM | POA: Diagnosis not present

## 2016-10-16 DIAGNOSIS — D631 Anemia in chronic kidney disease: Secondary | ICD-10-CM | POA: Diagnosis not present

## 2016-10-16 DIAGNOSIS — N186 End stage renal disease: Secondary | ICD-10-CM | POA: Diagnosis not present

## 2016-10-16 DIAGNOSIS — N2581 Secondary hyperparathyroidism of renal origin: Secondary | ICD-10-CM | POA: Diagnosis not present

## 2016-10-16 NOTE — Discharge Summary (Signed)
Patient left AMA before he could be seen.  Nephrology to arrange outpatient dialysis on his regular schedule.

## 2016-10-18 DIAGNOSIS — D509 Iron deficiency anemia, unspecified: Secondary | ICD-10-CM | POA: Diagnosis not present

## 2016-10-18 DIAGNOSIS — D631 Anemia in chronic kidney disease: Secondary | ICD-10-CM | POA: Diagnosis not present

## 2016-10-18 DIAGNOSIS — N186 End stage renal disease: Secondary | ICD-10-CM | POA: Diagnosis not present

## 2016-10-18 DIAGNOSIS — N2581 Secondary hyperparathyroidism of renal origin: Secondary | ICD-10-CM | POA: Diagnosis not present

## 2016-10-19 DIAGNOSIS — E875 Hyperkalemia: Secondary | ICD-10-CM | POA: Diagnosis not present

## 2016-10-20 ENCOUNTER — Emergency Department (HOSPITAL_COMMUNITY)
Admission: EM | Admit: 2016-10-20 | Discharge: 2016-10-20 | Disposition: A | Discharge status: Home / Self Care | Payer: Medicare Other | Attending: Emergency Medicine | Admitting: Emergency Medicine

## 2016-10-20 ENCOUNTER — Encounter (HOSPITAL_COMMUNITY): Payer: Self-pay

## 2016-10-20 ENCOUNTER — Emergency Department (HOSPITAL_COMMUNITY): Payer: Medicare Other

## 2016-10-20 DIAGNOSIS — R918 Other nonspecific abnormal finding of lung field: Secondary | ICD-10-CM | POA: Diagnosis not present

## 2016-10-20 DIAGNOSIS — J984 Other disorders of lung: Secondary | ICD-10-CM | POA: Diagnosis not present

## 2016-10-20 DIAGNOSIS — I132 Hypertensive heart and chronic kidney disease with heart failure and with stage 5 chronic kidney disease, or end stage renal disease: Secondary | ICD-10-CM | POA: Diagnosis not present

## 2016-10-20 DIAGNOSIS — R1084 Generalized abdominal pain: Secondary | ICD-10-CM | POA: Insufficient documentation

## 2016-10-20 DIAGNOSIS — K6389 Other specified diseases of intestine: Secondary | ICD-10-CM | POA: Diagnosis not present

## 2016-10-20 DIAGNOSIS — Z992 Dependence on renal dialysis: Secondary | ICD-10-CM | POA: Diagnosis not present

## 2016-10-20 DIAGNOSIS — E1122 Type 2 diabetes mellitus with diabetic chronic kidney disease: Secondary | ICD-10-CM | POA: Diagnosis not present

## 2016-10-20 DIAGNOSIS — R590 Localized enlarged lymph nodes: Secondary | ICD-10-CM | POA: Diagnosis not present

## 2016-10-20 DIAGNOSIS — R591 Generalized enlarged lymph nodes: Secondary | ICD-10-CM | POA: Diagnosis not present

## 2016-10-20 DIAGNOSIS — N186 End stage renal disease: Secondary | ICD-10-CM | POA: Insufficient documentation

## 2016-10-20 DIAGNOSIS — I5042 Chronic combined systolic (congestive) and diastolic (congestive) heart failure: Secondary | ICD-10-CM | POA: Insufficient documentation

## 2016-10-20 DIAGNOSIS — R0989 Other specified symptoms and signs involving the circulatory and respiratory systems: Secondary | ICD-10-CM | POA: Diagnosis not present

## 2016-10-20 DIAGNOSIS — G8929 Other chronic pain: Secondary | ICD-10-CM | POA: Diagnosis not present

## 2016-10-20 DIAGNOSIS — Z87891 Personal history of nicotine dependence: Secondary | ICD-10-CM | POA: Diagnosis not present

## 2016-10-20 DIAGNOSIS — I130 Hypertensive heart and chronic kidney disease with heart failure and stage 1 through stage 4 chronic kidney disease, or unspecified chronic kidney disease: Secondary | ICD-10-CM | POA: Diagnosis not present

## 2016-10-20 DIAGNOSIS — N3289 Other specified disorders of bladder: Secondary | ICD-10-CM | POA: Diagnosis not present

## 2016-10-20 DIAGNOSIS — R079 Chest pain, unspecified: Secondary | ICD-10-CM | POA: Insufficient documentation

## 2016-10-20 DIAGNOSIS — Z888 Allergy status to other drugs, medicaments and biological substances status: Secondary | ICD-10-CM | POA: Diagnosis not present

## 2016-10-20 DIAGNOSIS — R9431 Abnormal electrocardiogram [ECG] [EKG]: Secondary | ICD-10-CM | POA: Diagnosis not present

## 2016-10-20 DIAGNOSIS — I878 Other specified disorders of veins: Secondary | ICD-10-CM | POA: Diagnosis not present

## 2016-10-20 DIAGNOSIS — N2889 Other specified disorders of kidney and ureter: Secondary | ICD-10-CM | POA: Diagnosis not present

## 2016-10-20 DIAGNOSIS — E875 Hyperkalemia: Secondary | ICD-10-CM | POA: Diagnosis not present

## 2016-10-20 DIAGNOSIS — R195 Other fecal abnormalities: Secondary | ICD-10-CM | POA: Diagnosis not present

## 2016-10-20 DIAGNOSIS — I120 Hypertensive chronic kidney disease with stage 5 chronic kidney disease or end stage renal disease: Secondary | ICD-10-CM | POA: Diagnosis not present

## 2016-10-20 DIAGNOSIS — Z79899 Other long term (current) drug therapy: Secondary | ICD-10-CM | POA: Diagnosis not present

## 2016-10-20 DIAGNOSIS — I509 Heart failure, unspecified: Secondary | ICD-10-CM | POA: Diagnosis not present

## 2016-10-20 DIAGNOSIS — R109 Unspecified abdominal pain: Secondary | ICD-10-CM | POA: Diagnosis not present

## 2016-10-20 DIAGNOSIS — R0602 Shortness of breath: Secondary | ICD-10-CM | POA: Diagnosis not present

## 2016-10-20 DIAGNOSIS — N189 Chronic kidney disease, unspecified: Secondary | ICD-10-CM | POA: Diagnosis not present

## 2016-10-20 LAB — COMPREHENSIVE METABOLIC PANEL
ALBUMIN: 3.3 g/dL — AB (ref 3.5–5.0)
ALT: 10 U/L — AB (ref 17–63)
ANION GAP: 14 (ref 5–15)
AST: 26 U/L (ref 15–41)
Alkaline Phosphatase: 161 U/L — ABNORMAL HIGH (ref 38–126)
BILIRUBIN TOTAL: 0.5 mg/dL (ref 0.3–1.2)
BUN: 25 mg/dL — AB (ref 6–20)
CALCIUM: 8.1 mg/dL — AB (ref 8.9–10.3)
CO2: 28 mmol/L (ref 22–32)
CREATININE: 10.75 mg/dL — AB (ref 0.61–1.24)
Chloride: 94 mmol/L — ABNORMAL LOW (ref 101–111)
GFR calc Af Amer: 6 mL/min — ABNORMAL LOW (ref >60)
GFR calc non Af Amer: 5 mL/min — ABNORMAL LOW (ref >60)
GLUCOSE: 79 mg/dL (ref 65–99)
Potassium: 4.7 mmol/L (ref 3.5–5.1)
SODIUM: 136 mmol/L (ref 135–145)
Total Protein: 7.3 g/dL (ref 6.5–8.1)

## 2016-10-20 LAB — CBC
HCT: 31.1 % — ABNORMAL LOW (ref 39.0–52.0)
Hemoglobin: 9.8 g/dL — ABNORMAL LOW (ref 13.0–17.0)
MCH: 26.6 pg (ref 26.0–34.0)
MCHC: 31.5 g/dL (ref 30.0–36.0)
MCV: 84.3 fL (ref 78.0–100.0)
Platelets: 201 10*3/uL (ref 150–400)
RBC: 3.69 MIL/uL — AB (ref 4.22–5.81)
RDW: 17.3 % — ABNORMAL HIGH (ref 11.5–15.5)
WBC: 3.8 10*3/uL — ABNORMAL LOW (ref 4.0–10.5)

## 2016-10-20 LAB — I-STAT TROPONIN, ED: Troponin i, poc: 0.03 ng/mL (ref 0.00–0.08)

## 2016-10-20 LAB — LIPASE, BLOOD: Lipase: 37 U/L (ref 11–51)

## 2016-10-20 MED ORDER — ONDANSETRON HCL 4 MG/2ML IJ SOLN
4.0000 mg | Freq: Once | INTRAMUSCULAR | Status: AC
  Administered 2016-10-20: 4 mg via INTRAVENOUS
  Filled 2016-10-20: qty 2

## 2016-10-20 MED ORDER — HYDROMORPHONE HCL 1 MG/ML IJ SOLN
1.0000 mg | Freq: Once | INTRAMUSCULAR | Status: AC
  Administered 2016-10-20: 1 mg via INTRAVENOUS
  Filled 2016-10-20: qty 1

## 2016-10-20 NOTE — ED Triage Notes (Signed)
Pt complaining of abdominal pain, chest pain, shortness of breath and vomiting. Pt states onset today. Pt complaining of diffuse abdominal pain and central chest pain. Pt states on dialysis, MWF, states full treatment yesterday.

## 2016-10-20 NOTE — ED Notes (Signed)
Pt departed in NAD, escorted to waiting area by this RN.

## 2016-10-20 NOTE — ED Provider Notes (Signed)
[facility] DEPT Provider Note   CSN: [id number] Arrival date & time: 10/20/16  0149     History   Chief Complaint Chief Complaint  Patient presents with  . Chest Pain  . Abdominal Pain  . Emesis    HPI Frank [name] is a 51 y.o. male.  Patient with past medical history remarkable for ESRD on HD (MWF), had a full dialysis treatment yesterday, presents emergency department with chief complaint of chronic pain. He states that he has chronic abdominal pain. States that he has had some increased abdominal pain today. He states that due to his multiple medical problems, he is very cautious, and wanted to come to the emergency department to be evaluated. He states that he has not had any fevers or chills, but he did have some nausea earlier today. He states that he had some brief chest pain as well, but denies any shortness of breath. There are no modifying factors. There are no other associated symptoms.   The history is provided by the patient. No language interpreter was used.    Past Medical History:  Diagnosis Date  . Anemia   . Anxiety   . Chronic combined systolic and diastolic CHF (congestive heart failure) (HCC)    a. EF 20-25% by echo in 08/2015 b. echo 10/2015: EF 35-40%, diffuse HK, severe LAE, moderate RAE, small pericardial effusion  . Complication of anesthesia    itching, sore throat  . Depression   . Dialysis patient ([place])   . ESRD (end stage renal disease) ([place])    due to HTN per patient, followed at [facility], s/p failed kidney transplant - dialysis Tue, Th, Sat  . Hyperkalemia 12/2015  . Hypertension   . Junctional rhythm    a. noted in 08/2015: hyperkalemic at that time  b. 12/2015: presented in junctional rhythm w/ K+ of 6.6. Resolved with improvement of K+ levels.  . Nonischemic cardiomyopathy ([place])    a. 08/2014: cath showing minimal CAD, but tortuous arteries noted.   . Personal history of DVT (deep vein thrombosis)/ PE 05/26/2016   In Oct 2015 had small  subsemental LUL PE w/o DVT (LE dopplers neg) and was felt to be HD cath related, treated w coumadin.  IN May 2016 had small vein DVT (acute/subacute) in the R basilic/ brachial veins of the RUE, resumed on coumadin.  Had R sided HD cath at that time.    . Renal insufficiency   . Shortness of breath   . Type II diabetes mellitus (HCC)    No history per patient, but remains under history as A1c would not be accurate given on dialysis    Patient Active Problem List   Diagnosis Date Noted  . Hypertensive emergency   . Dissection of other specified artery ([place]) 08/04/2016  . Abdominal pain 08/04/2016  . End-stage renal disease on hemodialysis ([place])   . Subtherapeutic international normalized ratio (INR)   . Hypertension   . Problem with dialysis access ([place]) 06/22/2016  . ESRD (end stage renal disease) on dialysis ([place])   . ESRD (end stage renal disease) ([place]) 06/20/2016  . ESRD needing dialysis ([place]) 06/14/2016  . Uremia 06/07/2016  . Hyperkalemia 05/29/2016  . Chronic back pain 05/29/2016  . GERD (gastroesophageal reflux disease) 05/29/2016  . Personal history of DVT (deep vein thrombosis)/ PE 05/26/2016  . Bradycardia 03/31/2016  . Nonischemic cardiomyopathy ([place]) 01/09/2016  . Bilateral low back pain without sciatica   . Left renal mass 10/30/2015  . Constipation by  delayed colonic transit 10/30/2015  . Acute on chronic combined systolic and diastolic congestive heart failure ([place]) 09/23/2015  . Hypertensive urgency 09/08/2015  . Adjustment disorder with mixed anxiety and depressed mood 08/20/2015  . Chronic epididymitis 06/19/2015  . Groin pain, chronic, right 06/19/2015  . Incarcerated right inguinal hernia 02/16/2015  . Essential hypertension 01/02/2015  . Dyslipidemia   . ESRD on hemodialysis ([place])   . DM (diabetes mellitus), type 2, uncontrolled, with renal complications ([place])   . Type II diabetes mellitus ([place]) 05/08/2014  . Anemia of chronic kidney failure 06/24/2013     Past Surgical History:  Procedure Laterality Date  . CAPD INSERTION    . CAPD REMOVAL    . INGUINAL HERNIA REPAIR Right 02/14/2015   Procedure: REPAIR INCARCERATED RIGHT INGUINAL HERNIA;  Surgeon: [name], MD;  Location: [facility];  Service: General;  Laterality: Right;  . INSERTION OF DIALYSIS CATHETER Right 09/23/2015   Procedure: exchange of Right internal Dialysis Catheter.;  Surgeon: [name], MD;  Location: [facility];  Service: Vascular;  Laterality: Right;  . IR GENERIC HISTORICAL  07/16/2016   IR US GUIDE VASC ACCESS LEFT 07/16/2016 Corrie Mckusick, DO [facility]-INTERV RAD  . IR GENERIC HISTORICAL Left 07/16/2016   IR THROMBECTOMY AV FISTULA W/THROMBOLYSIS/PTA INC/SHUNT/IMG LEFT 07/16/2016 Corrie Mckusick, DO [facility]-INTER[facility]  . KIDNEY RECEIPIENT  2006   failed and started HD in March 2014  . LEFT HEART CATHETERIZATION WITH CORONARY ANGIOGRAM N/A 09/02/2014   Procedure: LEFT HEART CATHETERIZATION WITH CORONARY ANGIOGRAM;  Surgeon: [name], MD;  Location: [facility] CATH LAB;  Service: Cardiovascular;  Laterality: N/A;       Home Medications    Prior to Admission medications   Medication Sig Start Date End Date Taking? Authorizing Provider  amLODipine (NORVASC) 5 MG tablet Take 1 tablet (5 mg total) by mouth at bedtime. Patient taking differently: Take 10 mg by mouth at bedtime.  08/08/16  Yes Nita Sells, MD    Family History Family History  Problem Relation Age of Onset  . Hypertension Other     Social History Social History  Substance Use Topics  . Smoking status: Former Smoker    Packs/day: 0.00    Years: 1.00    Types: Cigarettes  . Smokeless tobacco: Never Used     Comment: quit Jan 2014  . Alcohol use No     Allergies   Butalbital-apap-caffeine; Ferrlecit [na ferric gluc cplx in sucrose]; Minoxidil; Tylenol [acetaminophen]; and Darvocet [propoxyphene n-acetaminophen]   Review of Systems Review of Systems  All other systems reviewed and are  negative.    Physical Exam Updated Vital Signs BP (!) 141/90   Pulse 74   Temp 98.5 F (36.9 C) (Oral)   Resp 17   SpO2 100%   Physical Exam  Constitutional: He is oriented to person, place, and time. He appears well-developed and well-nourished.  HENT:  Head: Normocephalic and atraumatic.  Eyes: Conjunctivae and EOM are normal. Pupils are equal, round, and reactive to light. Right eye exhibits no discharge. Left eye exhibits no discharge. No scleral icterus.  Neck: Normal range of motion. Neck supple. No JVD present.  Cardiovascular: Normal rate, regular rhythm and normal heart sounds.  Exam reveals no gallop and no friction rub.   No murmur heard. Pulmonary/Chest: Effort normal and breath sounds normal. No respiratory distress. He has no wheezes. He has no rales. He exhibits no tenderness.  Abdominal: Soft. He exhibits no distension and no mass. There is no  tenderness. There is no rebound and no guarding.  Musculoskeletal: Normal range of motion. He exhibits no edema or tenderness.  Neurological: He is alert and oriented to person, place, and time.  Skin: Skin is warm and dry.  Psychiatric: He has a normal mood and affect. His behavior is normal. Judgment and thought content normal.  Nursing note and vitals reviewed.    ED Treatments / Results  Labs (all labs ordered are listed, but only abnormal results are displayed) Labs Reviewed  CBC - Abnormal; Notable for the following:       Result Value   WBC 3.8 (*)    RBC 3.69 (*)    Hemoglobin 9.8 (*)    HCT 31.1 (*)    RDW 17.3 (*)    All other components within normal limits  COMPREHENSIVE METABOLIC PANEL - Abnormal; Notable for the following:    Chloride 94 (*)    BUN 25 (*)    Creatinine, Ser 10.75 (*)    Calcium 8.1 (*)    Albumin 3.3 (*)    ALT 10 (*)    Alkaline Phosphatase 161 (*)    GFR calc non Af Amer 5 (*)    GFR calc Af Amer 6 (*)    All other components within normal limits  LIPASE, BLOOD  I-STAT  TROPOININ, ED    EKG  EKG Interpretation  Date/Time:  Sunday October 20 2016 01:55:57 EDT Ventricular Rate:  66 PR Interval:  192 QRS Duration: 92 QT Interval:  476 QTC Calculation: 499 R Axis:   2 Text Interpretation:  Normal sinus rhythm Possible Left atrial enlargement Left ventricular hypertrophy with repolarization abnormality Prolonged QT Since last tracing rate slower and peaked T waves are no longer present Confirmed by Spearman  MD-I, IVA ([id number]) on 10/20/2016 1:59:56 AM       Radiology Dg Chest 2 View  Result Date: 10/20/2016 CLINICAL DATA:  Abdominal pain, chest pain, shortness of breath and vomiting. History of end-stage renal disease on dialysis, CHF. EXAM: CHEST  2 VIEW COMPARISON:  Chest radiograph October 13, 2016 FINDINGS: Cardiac silhouette is moderately enlarged and unchanged. Mediastinal silhouette is nonsuspicious, trace calcific atherosclerosis of the aortic arch. Similar mild interstitial prominence without pleural effusion or focal consolidation. No pneumothorax. Soft tissue planes and included osseous structures are unchanged. IMPRESSION: Stable cardiomegaly and mild interstitial prominence favoring pulmonary edema. Electronically Signed   By: Elon Alas M.D.   On: 10/20/2016 02:11    Procedures Procedures (including critical care time)  Medications Ordered in ED Medications  HYDROmorphone (DILAUDID) injection 1 mg (1 mg Intravenous Given 10/20/16 0321)  ondansetron (ZOFRAN) injection 4 mg (4 mg Intravenous Given 10/20/16 0320)     Initial Impression / Assessment and Plan / ED Course  I have reviewed the triage vital signs and the nursing notes.  Pertinent labs & imaging results that were available during my care of the patient were reviewed by me and considered in my medical decision making (see chart for details).     Patient here with abdominal pain, nausea, and brief chest pain. He states that he is concerned about his blood counts, as he frequently has  hyperkalemia. He states that he was dialyzed yesterday. He has reassuring laboratory work today. He is not hyperkalemic. He has negative troponin. His vital signs are stable. He feels very reassured after hearing that he has normal blood work. He asks if he can have something for pain and nausea, and states that he is comfortable  going home. I think this is an acceptable plan. I see no indication for emergent dialysis. His vital signs are stable. Doubt fluid overload. He is not hypoxic. He has a negative troponin. He is not vomiting. Will discharge to home.  Final Clinical Impressions(s) / ED Diagnoses   Final diagnoses:  Generalized abdominal pain    New Prescriptions New Prescriptions   No medications on file     Montine Circle, PA-C 10/20/16 0451    Merryl Hacker, MD 10/20/16 [id number]

## 2016-10-21 DIAGNOSIS — N186 End stage renal disease: Secondary | ICD-10-CM | POA: Diagnosis not present

## 2016-10-21 DIAGNOSIS — N2889 Other specified disorders of kidney and ureter: Secondary | ICD-10-CM | POA: Diagnosis not present

## 2016-10-21 DIAGNOSIS — N3289 Other specified disorders of bladder: Secondary | ICD-10-CM | POA: Diagnosis not present

## 2016-10-21 DIAGNOSIS — D631 Anemia in chronic kidney disease: Secondary | ICD-10-CM | POA: Diagnosis not present

## 2016-10-21 DIAGNOSIS — N2581 Secondary hyperparathyroidism of renal origin: Secondary | ICD-10-CM | POA: Diagnosis not present

## 2016-10-21 DIAGNOSIS — K6389 Other specified diseases of intestine: Secondary | ICD-10-CM | POA: Diagnosis not present

## 2016-10-21 DIAGNOSIS — R590 Localized enlarged lymph nodes: Secondary | ICD-10-CM | POA: Diagnosis not present

## 2016-10-22 ENCOUNTER — Encounter (HOSPITAL_COMMUNITY): Payer: Self-pay

## 2016-10-22 ENCOUNTER — Emergency Department (HOSPITAL_COMMUNITY)
Admission: EM | Admit: 2016-10-22 | Discharge: 2016-10-22 | Disposition: A | Discharge status: Home / Self Care | Payer: Medicare Other | Attending: Dermatology | Admitting: Dermatology

## 2016-10-22 DIAGNOSIS — M549 Dorsalgia, unspecified: Secondary | ICD-10-CM | POA: Insufficient documentation

## 2016-10-22 DIAGNOSIS — R5383 Other fatigue: Secondary | ICD-10-CM | POA: Diagnosis not present

## 2016-10-22 DIAGNOSIS — Z5321 Procedure and treatment not carried out due to patient leaving prior to being seen by health care provider: Secondary | ICD-10-CM | POA: Diagnosis not present

## 2016-10-22 NOTE — ED Notes (Signed)
Pt asking to be discharged. Pt stated that all he needed to do was rest. Pt had been laying down resting and waiting for a room. Melissa - RN informed.

## 2016-10-22 NOTE — ED Triage Notes (Signed)
Patient complains of fatigue and wanting to lay down and rest. States that he has been sitting with friend upstairs over night and also has strained his back and neck. Had dialysis yesterday and states not related to his renal status, refuses blood work at triage

## 2016-10-22 NOTE — ED Notes (Signed)
Called to update status and revital. No answer

## 2016-10-23 ENCOUNTER — Emergency Department (HOSPITAL_COMMUNITY)
Admission: EM | Admit: 2016-10-23 | Discharge: 2016-10-23 | Disposition: A | Discharge status: Home / Self Care | Payer: Medicare Other | Attending: Physician Assistant | Admitting: Physician Assistant

## 2016-10-23 ENCOUNTER — Encounter (HOSPITAL_COMMUNITY): Payer: Self-pay | Admitting: Emergency Medicine

## 2016-10-23 ENCOUNTER — Emergency Department (HOSPITAL_COMMUNITY)
Admission: EM | Admit: 2016-10-23 | Discharge: 2016-10-23 | Disposition: A | Discharge status: Home / Self Care | Payer: Medicare Other | Source: Home / Self Care

## 2016-10-23 ENCOUNTER — Encounter (HOSPITAL_COMMUNITY): Payer: Self-pay | Admitting: *Deleted

## 2016-10-23 DIAGNOSIS — M545 Low back pain, unspecified: Secondary | ICD-10-CM

## 2016-10-23 DIAGNOSIS — I5042 Chronic combined systolic (congestive) and diastolic (congestive) heart failure: Secondary | ICD-10-CM | POA: Diagnosis not present

## 2016-10-23 DIAGNOSIS — R5383 Other fatigue: Secondary | ICD-10-CM

## 2016-10-23 DIAGNOSIS — N186 End stage renal disease: Secondary | ICD-10-CM | POA: Diagnosis not present

## 2016-10-23 DIAGNOSIS — Z79899 Other long term (current) drug therapy: Secondary | ICD-10-CM | POA: Insufficient documentation

## 2016-10-23 DIAGNOSIS — I132 Hypertensive heart and chronic kidney disease with heart failure and with stage 5 chronic kidney disease, or end stage renal disease: Secondary | ICD-10-CM | POA: Insufficient documentation

## 2016-10-23 DIAGNOSIS — D631 Anemia in chronic kidney disease: Secondary | ICD-10-CM | POA: Diagnosis not present

## 2016-10-23 DIAGNOSIS — Z992 Dependence on renal dialysis: Secondary | ICD-10-CM | POA: Diagnosis not present

## 2016-10-23 DIAGNOSIS — Z87891 Personal history of nicotine dependence: Secondary | ICD-10-CM | POA: Diagnosis not present

## 2016-10-23 DIAGNOSIS — N2581 Secondary hyperparathyroidism of renal origin: Secondary | ICD-10-CM | POA: Diagnosis not present

## 2016-10-23 DIAGNOSIS — Z5321 Procedure and treatment not carried out due to patient leaving prior to being seen by health care provider: Secondary | ICD-10-CM | POA: Insufficient documentation

## 2016-10-23 DIAGNOSIS — E1122 Type 2 diabetes mellitus with diabetic chronic kidney disease: Secondary | ICD-10-CM | POA: Insufficient documentation

## 2016-10-23 DIAGNOSIS — G8929 Other chronic pain: Secondary | ICD-10-CM | POA: Diagnosis not present

## 2016-10-23 LAB — BASIC METABOLIC PANEL
Anion gap: 16 — ABNORMAL HIGH (ref 5–15)
BUN: 40 mg/dL — AB (ref 6–20)
CALCIUM: 8.1 mg/dL — AB (ref 8.9–10.3)
CO2: 28 mmol/L (ref 22–32)
CREATININE: 13.54 mg/dL — AB (ref 0.61–1.24)
Chloride: 93 mmol/L — ABNORMAL LOW (ref 101–111)
GFR, EST AFRICAN AMERICAN: 4 mL/min — AB (ref >60)
GFR, EST NON AFRICAN AMERICAN: 4 mL/min — AB (ref >60)
Glucose, Bld: 78 mg/dL (ref 65–99)
Potassium: 6.2 mmol/L — ABNORMAL HIGH (ref 3.5–5.1)
SODIUM: 137 mmol/L (ref 135–145)

## 2016-10-23 NOTE — Discharge Instructions (Signed)
Go to Dialysis now.

## 2016-10-23 NOTE — ED Triage Notes (Signed)
Here due to back pain.  Patient states that he has been feeling fatigued and sleeping on a chair that is not large enough.

## 2016-10-23 NOTE — ED Provider Notes (Signed)
[facility] DEPT Provider Note   CSN: [id number] Arrival date & time: 10/23/16  0359     History   Chief Complaint Chief Complaint  Patient presents with  . Back Pain    HPI Frank [name] is a 51 y.o. male.  The history is provided by the patient. No language interpreter was used.  Back Pain   This is a chronic problem. The problem occurs constantly. The problem has been gradually worsening. The pain is associated with no known injury. The pain is moderate. The pain is worse during the day. Pertinent negatives include no abdominal pain. He has tried nothing for the symptoms. The treatment provided no relief.  Pt sleep in a chair last pm and now has a bodyache.  Pt is suppose to be at dialysis this am.  Pt reports after stretching out   Past Medical History:  Diagnosis Date  . Anemia   . Anxiety   . Chronic combined systolic and diastolic CHF (congestive heart failure) (HCC)    a. EF 20-25% by echo in 08/2015 b. echo 10/2015: EF 35-40%, diffuse HK, severe LAE, moderate RAE, small pericardial effusion  . Complication of anesthesia    itching, sore throat  . Depression   . Dialysis patient ([place])   . ESRD (end stage renal disease) ([place])    due to HTN per patient, followed at [facility], s/p failed kidney transplant - dialysis Tue, Th, Sat  . Hyperkalemia 12/2015  . Hypertension   . Junctional rhythm    a. noted in 08/2015: hyperkalemic at that time  b. 12/2015: presented in junctional rhythm w/ K+ of 6.6. Resolved with improvement of K+ levels.  . Nonischemic cardiomyopathy ([place])    a. 08/2014: cath showing minimal CAD, but tortuous arteries noted.   . Personal history of DVT (deep vein thrombosis)/ PE 05/26/2016   In Oct 2015 had small subsemental LUL PE w/o DVT (LE dopplers neg) and was felt to be HD cath related, treated w coumadin.  IN May 2016 had small vein DVT (acute/subacute) in the R basilic/ brachial veins of the RUE, resumed on coumadin.  Had R sided HD cath at that time.     . Renal insufficiency   . Shortness of breath   . Type II diabetes mellitus (HCC)    No history per patient, but remains under history as A1c would not be accurate given on dialysis    Patient Active Problem List   Diagnosis Date Noted  . Hypertensive emergency   . Dissection of other specified artery ([place]) 08/04/2016  . Abdominal pain 08/04/2016  . End-stage renal disease on hemodialysis ([place])   . Subtherapeutic international normalized ratio (INR)   . Hypertension   . Problem with dialysis access ([place]) 06/22/2016  . ESRD (end stage renal disease) on dialysis ([place])   . ESRD (end stage renal disease) ([place]) 06/20/2016  . ESRD needing dialysis ([place]) 06/14/2016  . Uremia 06/07/2016  . Hyperkalemia 05/29/2016  . Chronic back pain 05/29/2016  . GERD (gastroesophageal reflux disease) 05/29/2016  . Personal history of DVT (deep vein thrombosis)/ PE 05/26/2016  . Bradycardia 03/31/2016  . Nonischemic cardiomyopathy ([place]) 01/09/2016  . Bilateral low back pain without sciatica   . Left renal mass 10/30/2015  . Constipation by delayed colonic transit 10/30/2015  . Acute on chronic combined systolic and diastolic congestive heart failure ([place]) 09/23/2015  . Hypertensive urgency 09/08/2015  . Adjustment disorder with mixed anxiety and depressed mood 08/20/2015  . Chronic epididymitis 06/19/2015  .  Groin pain, chronic, right 06/19/2015  . Incarcerated right inguinal hernia 02/16/2015  . Essential hypertension 01/02/2015  . Dyslipidemia   . ESRD on hemodialysis ([place])   . DM (diabetes mellitus), type 2, uncontrolled, with renal complications ([place])   . Type II diabetes mellitus ([place]) 05/08/2014  . Anemia of chronic kidney failure 06/24/2013    Past Surgical History:  Procedure Laterality Date  . CAPD INSERTION    . CAPD REMOVAL    . INGUINAL HERNIA REPAIR Right 02/14/2015   Procedure: REPAIR INCARCERATED RIGHT INGUINAL HERNIA;  Surgeon: [name], MD;  Location: [facility];  Service:  General;  Laterality: Right;  . INSERTION OF DIALYSIS CATHETER Right 09/23/2015   Procedure: exchange of Right internal Dialysis Catheter.;  Surgeon: [name], MD;  Location: [facility];  Service: Vascular;  Laterality: Right;  . IR GENERIC HISTORICAL  07/16/2016   IR US GUIDE VASC ACCESS LEFT 07/16/2016 Corrie Mckusick, DO [facility]  . IR GENERIC HISTORICAL Left 07/16/2016   IR THROMBECTOMY AV FISTULA W/THROMBOLYSIS/PTA INC/SHUNT/IMG LEFT 07/16/2016 Corrie Mckusick, DO [facility]  . KIDNEY RECEIPIENT  2006   failed and started HD in March 2014  . LEFT HEART CATHETERIZATION WITH CORONARY ANGIOGRAM N/A 09/02/2014   Procedure: LEFT HEART CATHETERIZATION WITH CORONARY ANGIOGRAM;  Surgeon: [name], MD;  Location: [facility] CATH LAB;  Service: Cardiovascular;  Laterality: N/A;       Home Medications    Prior to Admission medications   Medication Sig Start Date End Date Taking? Authorizing Provider  amLODipine (NORVASC) 5 MG tablet Take 1 tablet (5 mg total) by mouth at bedtime. Patient taking differently: Take 10 mg by mouth at bedtime.  08/08/16   Nita Sells, MD    Family History Family History  Problem Relation Age of Onset  . Hypertension Other     Social History Social History  Substance Use Topics  . Smoking status: Former Smoker    Packs/day: 0.00    Years: 1.00    Types: Cigarettes  . Smokeless tobacco: Never Used     Comment: quit Jan 2014  . Alcohol use No     Allergies   Butalbital-apap-caffeine; Ferrlecit [na ferric gluc cplx in sucrose]; Minoxidil; Tylenol [acetaminophen]; and Darvocet [propoxyphene n-acetaminophen]   Review of Systems Review of Systems  Gastrointestinal: Negative for abdominal pain.  Musculoskeletal: Positive for back pain.  All other systems reviewed and are negative.    Physical Exam Updated Vital Signs BP (!) 170/100   Pulse 71   Resp 18   SpO2 94%   Physical Exam  Constitutional: He appears well-developed and  well-nourished.  HENT:  Head: Normocephalic.  Eyes: Pupils are equal, round, and reactive to light.  Cardiovascular: Normal rate.   Pulmonary/Chest: Effort normal.  Abdominal: Soft.  Musculoskeletal: Normal range of motion.  Pt able to ambulate without pain  Neurological: He is alert.  Skin: Skin is warm.  Psychiatric: He has a normal mood and affect.  Vitals reviewed.    ED Treatments / Results  Labs (all labs ordered are listed, but only abnormal results are displayed) Labs Reviewed  BASIC METABOLIC PANEL - Abnormal; Notable for the following:       Result Value   Potassium 6.2 (*)    Chloride 93 (*)    BUN 40 (*)    Creatinine, Ser 13.54 (*)    Calcium 8.1 (*)    GFR calc non Af Amer 4 (*)    GFR calc Af Amer 4 (*)  Anion gap 16 (*)    All other components within normal limits    EKG  EKG Interpretation None       Radiology No results found.  Procedures Procedures (including critical care time)  Medications Ordered in ED Medications - No data to display   Initial Impression / Assessment and Plan / ED Course  I have reviewed the triage vital signs and the nursing notes.  Pertinent labs & imaging results that were available during my care of the patient were reviewed by me and considered in my medical decision making (see chart for details).     Pt called dialysis center.  He was suppose to be there at 5.   They agreed to take him now.   Final Clinical Impressions(s) / ED Diagnoses   Final diagnoses:  Chronic low back pain without sciatica, unspecified back pain laterality    New Prescriptions New Prescriptions   No medications on file     Fransico Meadow, PA-C 10/23/16 [place], MD 10/23/16 1524

## 2016-10-23 NOTE — ED Triage Notes (Signed)
Pt was seen a few hours ago and left after triage. Pt c/o back pain. Has been upstairs visiting a friend and has been sleeping in a chair

## 2016-10-25 DIAGNOSIS — D631 Anemia in chronic kidney disease: Secondary | ICD-10-CM | POA: Diagnosis not present

## 2016-10-25 DIAGNOSIS — N186 End stage renal disease: Secondary | ICD-10-CM | POA: Diagnosis not present

## 2016-10-25 DIAGNOSIS — N2581 Secondary hyperparathyroidism of renal origin: Secondary | ICD-10-CM | POA: Diagnosis not present

## 2016-10-28 DIAGNOSIS — N186 End stage renal disease: Secondary | ICD-10-CM | POA: Diagnosis not present

## 2016-10-28 DIAGNOSIS — D631 Anemia in chronic kidney disease: Secondary | ICD-10-CM | POA: Diagnosis not present

## 2016-10-28 DIAGNOSIS — N2581 Secondary hyperparathyroidism of renal origin: Secondary | ICD-10-CM | POA: Diagnosis not present

## 2016-10-30 DIAGNOSIS — D631 Anemia in chronic kidney disease: Secondary | ICD-10-CM | POA: Diagnosis not present

## 2016-10-30 DIAGNOSIS — N2581 Secondary hyperparathyroidism of renal origin: Secondary | ICD-10-CM | POA: Diagnosis not present

## 2016-10-30 DIAGNOSIS — N186 End stage renal disease: Secondary | ICD-10-CM | POA: Diagnosis not present

## 2016-10-31 DIAGNOSIS — E1122 Type 2 diabetes mellitus with diabetic chronic kidney disease: Secondary | ICD-10-CM | POA: Diagnosis not present

## 2016-10-31 DIAGNOSIS — E119 Type 2 diabetes mellitus without complications: Secondary | ICD-10-CM | POA: Diagnosis not present

## 2016-10-31 DIAGNOSIS — I4581 Long QT syndrome: Secondary | ICD-10-CM | POA: Diagnosis not present

## 2016-10-31 DIAGNOSIS — I10 Essential (primary) hypertension: Secondary | ICD-10-CM | POA: Diagnosis not present

## 2016-10-31 DIAGNOSIS — N289 Disorder of kidney and ureter, unspecified: Secondary | ICD-10-CM | POA: Diagnosis not present

## 2016-10-31 DIAGNOSIS — R1013 Epigastric pain: Secondary | ICD-10-CM | POA: Diagnosis not present

## 2016-10-31 DIAGNOSIS — Z888 Allergy status to other drugs, medicaments and biological substances status: Secondary | ICD-10-CM | POA: Diagnosis not present

## 2016-10-31 DIAGNOSIS — Z79899 Other long term (current) drug therapy: Secondary | ICD-10-CM | POA: Diagnosis not present

## 2016-10-31 DIAGNOSIS — R112 Nausea with vomiting, unspecified: Secondary | ICD-10-CM | POA: Diagnosis not present

## 2016-10-31 DIAGNOSIS — I517 Cardiomegaly: Secondary | ICD-10-CM | POA: Diagnosis not present

## 2016-10-31 DIAGNOSIS — R109 Unspecified abdominal pain: Secondary | ICD-10-CM | POA: Diagnosis not present

## 2016-10-31 DIAGNOSIS — Z7952 Long term (current) use of systemic steroids: Secondary | ICD-10-CM | POA: Diagnosis not present

## 2016-10-31 DIAGNOSIS — K5900 Constipation, unspecified: Secondary | ICD-10-CM | POA: Diagnosis not present

## 2016-10-31 DIAGNOSIS — N186 End stage renal disease: Secondary | ICD-10-CM | POA: Diagnosis not present

## 2016-10-31 DIAGNOSIS — Z87892 Personal history of anaphylaxis: Secondary | ICD-10-CM | POA: Diagnosis not present

## 2016-10-31 DIAGNOSIS — Z992 Dependence on renal dialysis: Secondary | ICD-10-CM | POA: Diagnosis not present

## 2016-10-31 DIAGNOSIS — R05 Cough: Secondary | ICD-10-CM | POA: Diagnosis not present

## 2016-11-01 ENCOUNTER — Emergency Department (HOSPITAL_COMMUNITY): Payer: Medicare Other

## 2016-11-01 ENCOUNTER — Emergency Department (HOSPITAL_COMMUNITY)
Admission: EM | Admit: 2016-11-01 | Discharge: 2016-11-01 | Disposition: A | Discharge status: Home / Self Care | Payer: Medicare Other | Attending: Emergency Medicine | Admitting: Emergency Medicine

## 2016-11-01 ENCOUNTER — Encounter (HOSPITAL_COMMUNITY): Payer: Self-pay

## 2016-11-01 DIAGNOSIS — I959 Hypotension, unspecified: Secondary | ICD-10-CM | POA: Diagnosis not present

## 2016-11-01 DIAGNOSIS — K5900 Constipation, unspecified: Secondary | ICD-10-CM | POA: Diagnosis not present

## 2016-11-01 DIAGNOSIS — Z79899 Other long term (current) drug therapy: Secondary | ICD-10-CM | POA: Diagnosis not present

## 2016-11-01 DIAGNOSIS — R109 Unspecified abdominal pain: Secondary | ICD-10-CM | POA: Diagnosis not present

## 2016-11-01 DIAGNOSIS — I5042 Chronic combined systolic (congestive) and diastolic (congestive) heart failure: Secondary | ICD-10-CM | POA: Insufficient documentation

## 2016-11-01 DIAGNOSIS — N186 End stage renal disease: Secondary | ICD-10-CM | POA: Insufficient documentation

## 2016-11-01 DIAGNOSIS — Z992 Dependence on renal dialysis: Secondary | ICD-10-CM | POA: Diagnosis not present

## 2016-11-01 DIAGNOSIS — E1122 Type 2 diabetes mellitus with diabetic chronic kidney disease: Secondary | ICD-10-CM | POA: Diagnosis not present

## 2016-11-01 DIAGNOSIS — Z87891 Personal history of nicotine dependence: Secondary | ICD-10-CM | POA: Insufficient documentation

## 2016-11-01 DIAGNOSIS — R1084 Generalized abdominal pain: Secondary | ICD-10-CM | POA: Insufficient documentation

## 2016-11-01 DIAGNOSIS — R05 Cough: Secondary | ICD-10-CM | POA: Diagnosis not present

## 2016-11-01 DIAGNOSIS — G8929 Other chronic pain: Secondary | ICD-10-CM | POA: Diagnosis not present

## 2016-11-01 DIAGNOSIS — R0602 Shortness of breath: Secondary | ICD-10-CM | POA: Diagnosis not present

## 2016-11-01 DIAGNOSIS — I132 Hypertensive heart and chronic kidney disease with heart failure and with stage 5 chronic kidney disease, or end stage renal disease: Secondary | ICD-10-CM | POA: Diagnosis not present

## 2016-11-01 DIAGNOSIS — R1010 Upper abdominal pain, unspecified: Secondary | ICD-10-CM | POA: Diagnosis present

## 2016-11-01 DIAGNOSIS — R079 Chest pain, unspecified: Secondary | ICD-10-CM | POA: Diagnosis not present

## 2016-11-01 DIAGNOSIS — I10 Essential (primary) hypertension: Secondary | ICD-10-CM

## 2016-11-01 LAB — CBC
HEMATOCRIT: 30.9 % — AB (ref 39.0–52.0)
HEMOGLOBIN: 9.8 g/dL — AB (ref 13.0–17.0)
MCH: 26.6 pg (ref 26.0–34.0)
MCHC: 31.7 g/dL (ref 30.0–36.0)
MCV: 83.7 fL (ref 78.0–100.0)
Platelets: 205 10*3/uL (ref 150–400)
RBC: 3.69 MIL/uL — ABNORMAL LOW (ref 4.22–5.81)
RDW: 17.6 % — ABNORMAL HIGH (ref 11.5–15.5)
WBC: 5 10*3/uL (ref 4.0–10.5)

## 2016-11-01 LAB — BASIC METABOLIC PANEL
ANION GAP: 12 (ref 5–15)
BUN: 23 mg/dL — ABNORMAL HIGH (ref 6–20)
CALCIUM: 7.4 mg/dL — AB (ref 8.9–10.3)
CHLORIDE: 97 mmol/L — AB (ref 101–111)
CO2: 31 mmol/L (ref 22–32)
Creatinine, Ser: 9.38 mg/dL — ABNORMAL HIGH (ref 0.61–1.24)
GFR calc Af Amer: 7 mL/min — ABNORMAL LOW (ref >60)
GFR, EST NON AFRICAN AMERICAN: 6 mL/min — AB (ref >60)
GLUCOSE: 71 mg/dL (ref 65–99)
POTASSIUM: 4.7 mmol/L (ref 3.5–5.1)
Sodium: 140 mmol/L (ref 135–145)

## 2016-11-01 LAB — HEPATIC FUNCTION PANEL
ALBUMIN: 3.3 g/dL — AB (ref 3.5–5.0)
ALK PHOS: 139 U/L — AB (ref 38–126)
ALT: 8 U/L — AB (ref 17–63)
AST: 20 U/L (ref 15–41)
BILIRUBIN INDIRECT: 0.8 mg/dL (ref 0.3–0.9)
Bilirubin, Direct: 0.2 mg/dL (ref 0.1–0.5)
TOTAL PROTEIN: 7.6 g/dL (ref 6.5–8.1)
Total Bilirubin: 1 mg/dL (ref 0.3–1.2)

## 2016-11-01 LAB — TROPONIN I
TROPONIN I: 0.03 ng/mL — AB (ref <0.03)
Troponin I: 0.03 ng/mL (ref <0.03)

## 2016-11-01 LAB — LIPASE, BLOOD: LIPASE: 23 U/L (ref 11–51)

## 2016-11-01 MED ORDER — DICYCLOMINE HCL 10 MG/ML IM SOLN
20.0000 mg | Freq: Once | INTRAMUSCULAR | Status: AC
  Administered 2016-11-01: 20 mg via INTRAMUSCULAR
  Filled 2016-11-01: qty 2

## 2016-11-01 NOTE — ED Notes (Signed)
Pt called out for vomiting. Observed spitting saliva into emesis bag. Pt reporting chest tightness. Points to epigastric and lower R ribcage area, stating "its swelling up right here". Pt continues to moan in presence of staff.

## 2016-11-01 NOTE — Discharge Instructions (Signed)
Take Miralax twice a day. This can be purchased over the counter. Take Tylenol for abdominal pain as needed. Continue to go to your dialysis as scheduled.

## 2016-11-01 NOTE — ED Notes (Signed)
Pt departed in NAD.  

## 2016-11-01 NOTE — ED Notes (Signed)
Pt sleeping soundly as I entered the room. Only began moaning as I woke him up to begin assessment.

## 2016-11-01 NOTE — ED Provider Notes (Signed)
[facility] DEPT Provider Note   CSN: [id number] Arrival date & time: 11/01/16  0251    History   Chief Complaint Chief Complaint  Patient presents with  . Chest Pain    HPI Frank [name] is a 51 y.o. male.  51 year old male with a history of CHF, ESRD on T/Th/Sa dialysis, hypertension, and diabetes mellitus presents to the emergency department for complaints of abdominal pain. He has a history of chronic abdominal pain. He reports a sensation of cramping in his upper abdomen. He has taken medications at home without relief. These medications were reportedly provided following discharge from [facility] emergency department yesterday after being evaluated for similar symptoms. Symptoms associated with nausea and reported vomiting. No emesis noted since arrival. On chart review, patient has had multiple CT scans in the past; all reassuring. He had an Xray today which showed constipation. No associated fevers. Patient reports two normal bowel movements today.   This is the patient's 45th visit to this ED in 6 months. He reports last being dialyzed yesterday.   The history is provided by the patient. No language interpreter was used.  Chest Pain      Past Medical History:  Diagnosis Date  . Anemia   . Anxiety   . Chronic combined systolic and diastolic CHF (congestive heart failure) (HCC)    a. EF 20-25% by echo in 08/2015 b. echo 10/2015: EF 35-40%, diffuse HK, severe LAE, moderate RAE, small pericardial effusion  . Complication of anesthesia    itching, sore throat  . Depression   . Dialysis patient ([place])   . ESRD (end stage renal disease) ([place])    due to HTN per patient, followed at [facility], s/p failed kidney transplant - dialysis Tue, Th, Sat  . Hyperkalemia 12/2015  . Hypertension   . Junctional rhythm    a. noted in 08/2015: hyperkalemic at that time  b. 12/2015: presented in junctional rhythm w/ K+ of 6.6. Resolved with improvement of K+ levels.  . Nonischemic  cardiomyopathy ([place])    a. 08/2014: cath showing minimal CAD, but tortuous arteries noted.   . Personal history of DVT (deep vein thrombosis)/ PE 05/26/2016   In Oct 2015 had small subsemental LUL PE w/o DVT (LE dopplers neg) and was felt to be HD cath related, treated w coumadin.  IN May 2016 had small vein DVT (acute/subacute) in the R basilic/ brachial veins of the RUE, resumed on coumadin.  Had R sided HD cath at that time.    . Renal insufficiency   . Shortness of breath   . Type II diabetes mellitus (HCC)    No history per patient, but remains under history as A1c would not be accurate given on dialysis    Patient Active Problem List   Diagnosis Date Noted  . Hypertensive emergency   . Dissection of other specified artery ([place]) 08/04/2016  . Abdominal pain 08/04/2016  . End-stage renal disease on hemodialysis ([place])   . Subtherapeutic international normalized ratio (INR)   . Hypertension   . Problem with dialysis access ([place]) 06/22/2016  . ESRD (end stage renal disease) on dialysis ([place])   . ESRD (end stage renal disease) ([place]) 06/20/2016  . ESRD needing dialysis ([place]) 06/14/2016  . Uremia 06/07/2016  . Hyperkalemia 05/29/2016  . Chronic back pain 05/29/2016  . GERD (gastroesophageal reflux disease) 05/29/2016  . Personal history of DVT (deep vein thrombosis)/ PE 05/26/2016  . Bradycardia 03/31/2016  . Nonischemic cardiomyopathy ([place]) 01/09/2016  . Bilateral low  back pain without sciatica   . Left renal mass 10/30/2015  . Constipation by delayed colonic transit 10/30/2015  . Acute on chronic combined systolic and diastolic congestive heart failure ([place]) 09/23/2015  . Hypertensive urgency 09/08/2015  . Adjustment disorder with mixed anxiety and depressed mood 08/20/2015  . Chronic epididymitis 06/19/2015  . Groin pain, chronic, right 06/19/2015  . Incarcerated right inguinal hernia 02/16/2015  . Essential hypertension 01/02/2015  . Dyslipidemia   . ESRD on hemodialysis  ([place])   . DM (diabetes mellitus), type 2, uncontrolled, with renal complications ([place])   . Type II diabetes mellitus ([place]) 05/08/2014  . Anemia of chronic kidney failure 06/24/2013    Past Surgical History:  Procedure Laterality Date  . CAPD INSERTION    . CAPD REMOVAL    . INGUINAL HERNIA REPAIR Right 02/14/2015   Procedure: REPAIR INCARCERATED RIGHT INGUINAL HERNIA;  Surgeon: [name], MD;  Location: [facility];  Service: General;  Laterality: Right;  . INSERTION OF DIALYSIS CATHETER Right 09/23/2015   Procedure: exchange of Right internal Dialysis Catheter.;  Surgeon: [name], MD;  Location: [facility];  Service: Vascular;  Laterality: Right;  . IR GENERIC HISTORICAL  07/16/2016   IR US GUIDE VASC ACCESS LEFT 07/16/2016 Corrie Mckusick, DO [facility]  . IR GENERIC HISTORICAL Left 07/16/2016   IR THROMBECTOMY AV FISTULA W/THROMBOLYSIS/PTA INC/SHUNT/IMG LEFT 07/16/2016 Corrie Mckusick, DO [facility]  . KIDNEY RECEIPIENT  2006   failed and started HD in March 2014  . LEFT HEART CATHETERIZATION WITH CORONARY ANGIOGRAM N/A 09/02/2014   Procedure: LEFT HEART CATHETERIZATION WITH CORONARY ANGIOGRAM;  Surgeon: [name], MD;  Location: [facility] CATH LAB;  Service: Cardiovascular;  Laterality: N/A;       Home Medications    Prior to Admission medications   Medication Sig Start Date End Date Taking? Authorizing Provider  amLODipine (NORVASC) 5 MG tablet Take 1 tablet (5 mg total) by mouth at bedtime. Patient taking differently: Take 10 mg by mouth at bedtime.  08/08/16   Nita Sells, MD    Family History Family History  Problem Relation Age of Onset  . Hypertension Other     Social History Social History  Substance Use Topics  . Smoking status: Former Smoker    Packs/day: 0.00    Years: 1.00    Types: Cigarettes  . Smokeless tobacco: Never Used     Comment: quit Jan 2014  . Alcohol use No     Allergies   Butalbital-apap-caffeine; Ferrlecit [na ferric gluc cplx in  sucrose]; Minoxidil; Tylenol [acetaminophen]; and Darvocet [propoxyphene n-acetaminophen]   Review of Systems Review of Systems  Cardiovascular: Positive for chest pain.  Ten systems reviewed and are negative for acute change, except as noted in the HPI.    Physical Exam Updated Vital Signs BP (!) 174/103   Pulse 65   Temp 98.1 F (36.7 C) (Oral)   Resp (!) 22   Ht _0  (1.88 m)   Wt 74.8 kg   SpO2 92%   BMI 21.18 kg/m   Physical Exam  Constitutional: He is oriented to person, place, and time. He appears well-developed and well-nourished. No distress.  Patient whining, but in NAD  HENT:  Head: Normocephalic and atraumatic.  Eyes: Conjunctivae and EOM are normal. No scleral icterus.  Neck: Normal range of motion.  Cardiovascular: Normal rate, regular rhythm and intact distal pulses.   Pulmonary/Chest: Effort normal. No respiratory distress. He has no wheezes.  Respirations even and unlabored  Abdominal: Soft. He exhibits no mass. There is no guarding.  Soft abdomen without focal tenderness. No involuntary guarding or peritoneal signs. No palpable masses. Abdomen nondistended.  Musculoskeletal: Normal range of motion.  Neurological: He is alert and oriented to person, place, and time. He exhibits normal muscle tone. Coordination normal.  GCS 15. Patient moving all extremities.  Skin: Skin is warm and dry. No rash noted. He is not diaphoretic. No erythema. No pallor.  Psychiatric: He has a normal mood and affect. His behavior is normal.  Nursing note and vitals reviewed.    ED Treatments / Results  Labs (all labs ordered are listed, but only abnormal results are displayed) Labs Reviewed  BASIC METABOLIC PANEL - Abnormal; Notable for the following:       Result Value   Chloride 97 (*)    BUN 23 (*)    Creatinine, Ser 9.38 (*)    Calcium 7.4 (*)    GFR calc non Af Amer 6 (*)    GFR calc Af Amer 7 (*)    All other components within normal limits  CBC - Abnormal;  Notable for the following:    RBC 3.69 (*)    Hemoglobin 9.8 (*)    HCT 30.9 (*)    RDW 17.6 (*)    All other components within normal limits  TROPONIN I - Abnormal; Notable for the following:    Troponin I 0.03 (*)    All other components within normal limits  HEPATIC FUNCTION PANEL - Abnormal; Notable for the following:    Albumin 3.3 (*)    ALT 8 (*)    Alkaline Phosphatase 139 (*)    All other components within normal limits  TROPONIN I - Abnormal; Notable for the following:    Troponin I 0.03 (*)    All other components within normal limits  LIPASE, BLOOD    EKG  EKG Interpretation None       Radiology Dg Chest 2 View  Result Date: 11/01/2016 CLINICAL DATA:  Acute onset of generalized chest pain, radiating to the abdomen. Cough, shortness of breath and dizziness. Initial encounter. EXAM: CHEST  2 VIEW COMPARISON:  Chest radiograph performed 10/20/2016 FINDINGS: The lungs are well-aerated. Vascular congestion is noted. Increased interstitial markings raise concern for mild interstitial edema. There is no evidence of pleural effusion or pneumothorax. The heart is borderline enlarged. No acute osseous abnormalities are seen. IMPRESSION: Vascular congestion and borderline cardiomegaly. Increased interstitial markings raise concern for mild interstitial edema. Electronically Signed   By: Garald Balding M.D.   On: 11/01/2016 03:50    Acute Abdominal Series - 10/31/16 per Care Everywhere Result Impression  IMPRESSION:  No evidence of obstruction. Large amount of retained stool in the colon.  Result Narrative  INDICATION: Abdominal pain  TECHNIQUE: AP view of the chest. Supine and erect AP views of the abdomen.  COMPARISON: CT from 10/21/2016.  FINDINGS:  No acute pulmonary infiltrate.  No pleural effusions.  The cardiomediastinal silhouette is within normal limits.  No dilated bowel. No intraperitoneal free air is seen. Stool and gas are noted throughout the colon to  the level of the rectum. Large amount of retained stool in the colon.  Status    CT Abdomen Pelvis WO IV Contrast Joaquim Lai Protocol) 10/21/2016 [facility] Result Impression  IMPRESSION:   Possible gastroenteritis.   Mild to moderate amount of colonic stool.  Stable mild retroperitoneal and left inguinal adenopathy.  Stable left renal mass, which is suspicious for  a renal cell carcinoma.    Procedures Procedures (including critical care time)  Medications Ordered in ED Medications  dicyclomine (BENTYL) injection 20 mg (20 mg Intramuscular Given 11/01/16 0555)     Initial Impression / Assessment and Plan / ED Course  I have reviewed the triage vital signs and the nursing notes.  Pertinent labs & imaging results that were available during my care of the patient were reviewed by me and considered in my medical decision making (see chart for details).     51 year old male presents to the emergency department for complaints of abdominal pain. He was seen yesterday at Emergency department for similar complaints. He had an x-ray which showed constipation. He has history of multiple CT scans in the past which have been reassuring.  Patient is afebrile today with stable vital signs. Hypertension at baseline. Laboratory workup today is consistent with prior evaluations. Patient has no leukocytosis. Troponin stable.  Patient has been given Bentyl for complaints of abdominal pain. He has been encouraged to use MiraLAX daily for constipation. Tylenol recommended for pain. I do not believe further emergent workup is indicated at this time. Patient with history of 45 visits to the emergency department in the last 6 months, frequently for various pain complaints. He has been instructed to follow-up with a primary care doctor. Return precautions provided at discharge. Patient discharged in stable condition.   Final Clinical Impressions(s) / ED Diagnoses   Final diagnoses:  Chronic abdominal  pain  Constipation, unspecified constipation type    New Prescriptions New Prescriptions   No medications on file     [name], PA-C 11/01/16 4580    April Palumbo, MD 11/01/16 [phone]

## 2016-11-01 NOTE — ED Triage Notes (Addendum)
Pt endorses chest pain radiating to the abdomen with a "bad cough and phlem" Pt endorse shob, dizziness, n/v. Pt hypertensive in triage. Pt is dialysis mon-wed-fri, last dialyzed wed.

## 2016-11-02 DIAGNOSIS — N2581 Secondary hyperparathyroidism of renal origin: Secondary | ICD-10-CM | POA: Diagnosis not present

## 2016-11-02 DIAGNOSIS — N186 End stage renal disease: Secondary | ICD-10-CM | POA: Diagnosis not present

## 2016-11-02 DIAGNOSIS — D631 Anemia in chronic kidney disease: Secondary | ICD-10-CM | POA: Diagnosis not present

## 2016-11-04 DIAGNOSIS — D631 Anemia in chronic kidney disease: Secondary | ICD-10-CM | POA: Diagnosis not present

## 2016-11-04 DIAGNOSIS — N2581 Secondary hyperparathyroidism of renal origin: Secondary | ICD-10-CM | POA: Diagnosis not present

## 2016-11-04 DIAGNOSIS — N186 End stage renal disease: Secondary | ICD-10-CM | POA: Diagnosis not present

## 2016-11-06 DIAGNOSIS — D631 Anemia in chronic kidney disease: Secondary | ICD-10-CM | POA: Diagnosis not present

## 2016-11-06 DIAGNOSIS — E213 Hyperparathyroidism, unspecified: Secondary | ICD-10-CM | POA: Diagnosis not present

## 2016-11-06 DIAGNOSIS — N186 End stage renal disease: Secondary | ICD-10-CM | POA: Diagnosis not present

## 2016-11-06 DIAGNOSIS — N2581 Secondary hyperparathyroidism of renal origin: Secondary | ICD-10-CM | POA: Diagnosis not present

## 2016-11-06 DIAGNOSIS — Z992 Dependence on renal dialysis: Secondary | ICD-10-CM | POA: Diagnosis not present

## 2016-11-06 DIAGNOSIS — R891 Abnormal level of hormones in specimens from other organs, systems and tissues: Secondary | ICD-10-CM | POA: Diagnosis not present

## 2016-11-08 DIAGNOSIS — N186 End stage renal disease: Secondary | ICD-10-CM | POA: Diagnosis not present

## 2016-11-08 DIAGNOSIS — D631 Anemia in chronic kidney disease: Secondary | ICD-10-CM | POA: Diagnosis not present

## 2016-11-08 DIAGNOSIS — N2581 Secondary hyperparathyroidism of renal origin: Secondary | ICD-10-CM | POA: Diagnosis not present

## 2016-11-11 DIAGNOSIS — D631 Anemia in chronic kidney disease: Secondary | ICD-10-CM | POA: Diagnosis not present

## 2016-11-11 DIAGNOSIS — N186 End stage renal disease: Secondary | ICD-10-CM | POA: Diagnosis not present

## 2016-11-11 DIAGNOSIS — N2581 Secondary hyperparathyroidism of renal origin: Secondary | ICD-10-CM | POA: Diagnosis not present

## 2016-11-12 ENCOUNTER — Ambulatory Visit: Payer: Medicare Other | Admitting: Urology

## 2016-11-13 DIAGNOSIS — N2581 Secondary hyperparathyroidism of renal origin: Secondary | ICD-10-CM | POA: Diagnosis not present

## 2016-11-13 DIAGNOSIS — N186 End stage renal disease: Secondary | ICD-10-CM | POA: Diagnosis not present

## 2016-11-13 DIAGNOSIS — D631 Anemia in chronic kidney disease: Secondary | ICD-10-CM | POA: Diagnosis not present

## 2016-11-16 DIAGNOSIS — N2581 Secondary hyperparathyroidism of renal origin: Secondary | ICD-10-CM | POA: Diagnosis not present

## 2016-11-16 DIAGNOSIS — D631 Anemia in chronic kidney disease: Secondary | ICD-10-CM | POA: Diagnosis not present

## 2016-11-16 DIAGNOSIS — N186 End stage renal disease: Secondary | ICD-10-CM | POA: Diagnosis not present

## 2016-11-18 DIAGNOSIS — N186 End stage renal disease: Secondary | ICD-10-CM | POA: Diagnosis not present

## 2016-11-18 DIAGNOSIS — N2581 Secondary hyperparathyroidism of renal origin: Secondary | ICD-10-CM | POA: Diagnosis not present

## 2016-11-18 DIAGNOSIS — T8610 Unspecified complication of kidney transplant: Secondary | ICD-10-CM | POA: Diagnosis not present

## 2016-11-18 DIAGNOSIS — D631 Anemia in chronic kidney disease: Secondary | ICD-10-CM | POA: Diagnosis not present

## 2016-11-18 DIAGNOSIS — Z992 Dependence on renal dialysis: Secondary | ICD-10-CM | POA: Diagnosis not present

## 2016-11-19 ENCOUNTER — Ambulatory Visit (HOSPITAL_COMMUNITY): Payer: Medicare Other | Admitting: Psychiatry

## 2016-11-21 DIAGNOSIS — N2581 Secondary hyperparathyroidism of renal origin: Secondary | ICD-10-CM | POA: Diagnosis not present

## 2016-11-21 DIAGNOSIS — N186 End stage renal disease: Secondary | ICD-10-CM | POA: Diagnosis not present

## 2016-11-21 DIAGNOSIS — D631 Anemia in chronic kidney disease: Secondary | ICD-10-CM | POA: Diagnosis not present

## 2016-11-22 DIAGNOSIS — N186 End stage renal disease: Secondary | ICD-10-CM | POA: Diagnosis not present

## 2016-11-22 DIAGNOSIS — D631 Anemia in chronic kidney disease: Secondary | ICD-10-CM | POA: Diagnosis not present

## 2016-11-22 DIAGNOSIS — N2581 Secondary hyperparathyroidism of renal origin: Secondary | ICD-10-CM | POA: Diagnosis not present

## 2016-11-25 DIAGNOSIS — D631 Anemia in chronic kidney disease: Secondary | ICD-10-CM | POA: Diagnosis not present

## 2016-11-25 DIAGNOSIS — N186 End stage renal disease: Secondary | ICD-10-CM | POA: Diagnosis not present

## 2016-11-25 DIAGNOSIS — N2581 Secondary hyperparathyroidism of renal origin: Secondary | ICD-10-CM | POA: Diagnosis not present

## 2016-11-29 ENCOUNTER — Emergency Department (HOSPITAL_COMMUNITY): Payer: Medicare Other

## 2016-11-29 ENCOUNTER — Encounter (HOSPITAL_COMMUNITY): Payer: Self-pay | Admitting: Emergency Medicine

## 2016-11-29 ENCOUNTER — Inpatient Hospital Stay (HOSPITAL_COMMUNITY)
Admission: EM | Admit: 2016-11-29 | Discharge: 2016-12-01 | DRG: 640 | Disposition: A | Discharge status: Home / Self Care | Payer: Medicare Other | Attending: Student in an Organized Health Care Education/Training Program | Admitting: Student in an Organized Health Care Education/Training Program

## 2016-11-29 ENCOUNTER — Observation Stay (HOSPITAL_COMMUNITY): Payer: Medicare Other

## 2016-11-29 DIAGNOSIS — N189 Chronic kidney disease, unspecified: Secondary | ICD-10-CM

## 2016-11-29 DIAGNOSIS — D631 Anemia in chronic kidney disease: Secondary | ICD-10-CM | POA: Diagnosis present

## 2016-11-29 DIAGNOSIS — E1159 Type 2 diabetes mellitus with other circulatory complications: Secondary | ICD-10-CM | POA: Diagnosis present

## 2016-11-29 DIAGNOSIS — F329 Major depressive disorder, single episode, unspecified: Secondary | ICD-10-CM | POA: Diagnosis present

## 2016-11-29 DIAGNOSIS — I10 Essential (primary) hypertension: Secondary | ICD-10-CM | POA: Diagnosis present

## 2016-11-29 DIAGNOSIS — Z86718 Personal history of other venous thrombosis and embolism: Secondary | ICD-10-CM

## 2016-11-29 DIAGNOSIS — Z992 Dependence on renal dialysis: Secondary | ICD-10-CM

## 2016-11-29 DIAGNOSIS — I132 Hypertensive heart and chronic kidney disease with heart failure and with stage 5 chronic kidney disease, or end stage renal disease: Secondary | ICD-10-CM | POA: Diagnosis not present

## 2016-11-29 DIAGNOSIS — K219 Gastro-esophageal reflux disease without esophagitis: Secondary | ICD-10-CM | POA: Diagnosis present

## 2016-11-29 DIAGNOSIS — N186 End stage renal disease: Secondary | ICD-10-CM | POA: Diagnosis not present

## 2016-11-29 DIAGNOSIS — R1084 Generalized abdominal pain: Secondary | ICD-10-CM | POA: Diagnosis not present

## 2016-11-29 DIAGNOSIS — N19 Unspecified kidney failure: Secondary | ICD-10-CM | POA: Diagnosis present

## 2016-11-29 DIAGNOSIS — IMO0002 Reserved for concepts with insufficient information to code with codable children: Secondary | ICD-10-CM | POA: Diagnosis present

## 2016-11-29 DIAGNOSIS — I152 Hypertension secondary to endocrine disorders: Secondary | ICD-10-CM | POA: Diagnosis present

## 2016-11-29 DIAGNOSIS — E875 Hyperkalemia: Secondary | ICD-10-CM | POA: Diagnosis not present

## 2016-11-29 DIAGNOSIS — Z765 Malingerer [conscious simulation]: Secondary | ICD-10-CM

## 2016-11-29 DIAGNOSIS — E1129 Type 2 diabetes mellitus with other diabetic kidney complication: Secondary | ICD-10-CM | POA: Diagnosis present

## 2016-11-29 DIAGNOSIS — R109 Unspecified abdominal pain: Secondary | ICD-10-CM | POA: Diagnosis not present

## 2016-11-29 DIAGNOSIS — G8929 Other chronic pain: Secondary | ICD-10-CM | POA: Diagnosis present

## 2016-11-29 DIAGNOSIS — I120 Hypertensive chronic kidney disease with stage 5 chronic kidney disease or end stage renal disease: Secondary | ICD-10-CM | POA: Diagnosis not present

## 2016-11-29 DIAGNOSIS — E1165 Type 2 diabetes mellitus with hyperglycemia: Secondary | ICD-10-CM

## 2016-11-29 DIAGNOSIS — I440 Atrioventricular block, first degree: Secondary | ICD-10-CM | POA: Diagnosis present

## 2016-11-29 DIAGNOSIS — R1013 Epigastric pain: Secondary | ICD-10-CM | POA: Diagnosis present

## 2016-11-29 DIAGNOSIS — E1122 Type 2 diabetes mellitus with diabetic chronic kidney disease: Secondary | ICD-10-CM | POA: Diagnosis not present

## 2016-11-29 DIAGNOSIS — I5043 Acute on chronic combined systolic (congestive) and diastolic (congestive) heart failure: Secondary | ICD-10-CM | POA: Diagnosis not present

## 2016-11-29 DIAGNOSIS — N2581 Secondary hyperparathyroidism of renal origin: Secondary | ICD-10-CM | POA: Diagnosis not present

## 2016-11-29 DIAGNOSIS — Z87891 Personal history of nicotine dependence: Secondary | ICD-10-CM

## 2016-11-29 DIAGNOSIS — Z9119 Patient's noncompliance with other medical treatment and regimen: Secondary | ICD-10-CM

## 2016-11-29 DIAGNOSIS — R079 Chest pain, unspecified: Secondary | ICD-10-CM | POA: Diagnosis not present

## 2016-11-29 DIAGNOSIS — I5023 Acute on chronic systolic (congestive) heart failure: Secondary | ICD-10-CM | POA: Diagnosis present

## 2016-11-29 DIAGNOSIS — E1142 Type 2 diabetes mellitus with diabetic polyneuropathy: Secondary | ICD-10-CM | POA: Diagnosis present

## 2016-11-29 DIAGNOSIS — Z91199 Patient's noncompliance with other medical treatment and regimen due to unspecified reason: Secondary | ICD-10-CM

## 2016-11-29 LAB — COMPREHENSIVE METABOLIC PANEL
ALT: 12 U/L — ABNORMAL LOW (ref 17–63)
AST: 20 U/L (ref 15–41)
Albumin: 3.5 g/dL (ref 3.5–5.0)
Alkaline Phosphatase: 185 U/L — ABNORMAL HIGH (ref 38–126)
Anion gap: 16 — ABNORMAL HIGH (ref 5–15)
BUN: 58 mg/dL — AB (ref 6–20)
CHLORIDE: 96 mmol/L — AB (ref 101–111)
CO2: 23 mmol/L (ref 22–32)
Calcium: 8.7 mg/dL — ABNORMAL LOW (ref 8.9–10.3)
Creatinine, Ser: 14.71 mg/dL — ABNORMAL HIGH (ref 0.61–1.24)
GFR calc Af Amer: 4 mL/min — ABNORMAL LOW (ref >60)
GFR calc non Af Amer: 3 mL/min — ABNORMAL LOW (ref >60)
GLUCOSE: 80 mg/dL (ref 65–99)
Sodium: 135 mmol/L (ref 135–145)
Total Bilirubin: 0.9 mg/dL (ref 0.3–1.2)
Total Protein: 7.9 g/dL (ref 6.5–8.1)

## 2016-11-29 LAB — CBC
HEMATOCRIT: 33.8 % — AB (ref 39.0–52.0)
Hemoglobin: 10.6 g/dL — ABNORMAL LOW (ref 13.0–17.0)
MCH: 26.5 pg (ref 26.0–34.0)
MCHC: 31.4 g/dL (ref 30.0–36.0)
MCV: 84.5 fL (ref 78.0–100.0)
Platelets: 199 10*3/uL (ref 150–400)
RBC: 4 MIL/uL — ABNORMAL LOW (ref 4.22–5.81)
RDW: 19.6 % — AB (ref 11.5–15.5)
WBC: 4.9 10*3/uL (ref 4.0–10.5)

## 2016-11-29 LAB — LIPASE, BLOOD: LIPASE: 22 U/L (ref 11–51)

## 2016-11-29 LAB — CBG MONITORING, ED
GLUCOSE-CAPILLARY: 172 mg/dL — AB (ref 65–99)
Glucose-Capillary: 134 mg/dL — ABNORMAL HIGH (ref 65–99)

## 2016-11-29 MED ORDER — MORPHINE SULFATE (PF) 4 MG/ML IV SOLN
INTRAVENOUS | Status: AC
  Administered 2016-11-29: 2 mg via INTRAVENOUS
  Filled 2016-11-29: qty 1

## 2016-11-29 MED ORDER — FAMOTIDINE 20 MG PO TABS
20.0000 mg | ORAL_TABLET | Freq: Every day | ORAL | Status: DC
  Administered 2016-11-29 – 2016-11-30 (×2): 20 mg via ORAL
  Filled 2016-11-29 (×3): qty 1

## 2016-11-29 MED ORDER — DOXAZOSIN MESYLATE 2 MG PO TABS
4.0000 mg | ORAL_TABLET | Freq: Every day | ORAL | Status: DC
  Administered 2016-11-29 – 2016-11-30 (×2): 4 mg via ORAL
  Filled 2016-11-29 (×2): qty 2
  Filled 2016-11-29: qty 1

## 2016-11-29 MED ORDER — DEXTROSE 50 % IV SOLN
50.0000 mL | Freq: Once | INTRAVENOUS | Status: AC
  Administered 2016-11-29: 50 mL via INTRAVENOUS
  Filled 2016-11-29: qty 50

## 2016-11-29 MED ORDER — PANTOPRAZOLE SODIUM 40 MG PO TBEC
40.0000 mg | DELAYED_RELEASE_TABLET | Freq: Two times a day (BID) | ORAL | Status: DC
  Administered 2016-11-30 – 2016-12-01 (×3): 40 mg via ORAL
  Filled 2016-11-29 (×5): qty 1

## 2016-11-29 MED ORDER — SEVELAMER CARBONATE 800 MG PO TABS
800.0000 mg | ORAL_TABLET | Freq: Three times a day (TID) | ORAL | Status: DC
  Administered 2016-11-30 – 2016-12-01 (×5): 800 mg via ORAL
  Filled 2016-11-29 (×5): qty 1

## 2016-11-29 MED ORDER — RENA-VITE PO TABS
1.0000 | ORAL_TABLET | Freq: Every day | ORAL | Status: DC
  Administered 2016-11-29 – 2016-11-30 (×2): 1 via ORAL
  Filled 2016-11-29 (×2): qty 1

## 2016-11-29 MED ORDER — CALCIUM GLUCONATE 10 % IV SOLN
1.0000 g | Freq: Once | INTRAVENOUS | Status: AC
  Administered 2016-11-29: 1 g via INTRAVENOUS
  Filled 2016-11-29: qty 10

## 2016-11-29 MED ORDER — ONDANSETRON 4 MG PO TBDP
4.0000 mg | ORAL_TABLET | Freq: Three times a day (TID) | ORAL | Status: DC | PRN
  Filled 2016-11-29: qty 1

## 2016-11-29 MED ORDER — SODIUM CHLORIDE 0.9% FLUSH
3.0000 mL | Freq: Two times a day (BID) | INTRAVENOUS | Status: DC
  Administered 2016-11-29 – 2016-12-01 (×4): 3 mL via INTRAVENOUS

## 2016-11-29 MED ORDER — ALUM & MAG HYDROXIDE-SIMETH 200-200-20 MG/5ML PO SUSP: 30.0000 mL | Freq: Four times a day (QID) | ORAL | Status: DC | PRN

## 2016-11-29 MED ORDER — CINACALCET HCL 30 MG PO TABS
30.0000 mg | ORAL_TABLET | Freq: Every day | ORAL | Status: DC
Start: 2016-11-29 — End: 2016-12-01
  Administered 2016-11-29 – 2016-11-30 (×2): 30 mg via ORAL
  Filled 2016-11-29 (×2): qty 1

## 2016-11-29 MED ORDER — INSULIN ASPART 100 UNIT/ML ~~LOC~~ SOLN
10.0000 [IU] | Freq: Once | SUBCUTANEOUS | Status: AC
  Administered 2016-11-29: 10 [IU] via INTRAVENOUS

## 2016-11-29 MED ORDER — MORPHINE SULFATE (PF) 4 MG/ML IV SOLN: 2.0000 mg | Freq: Once | INTRAVENOUS | Status: DC

## 2016-11-29 MED ORDER — SODIUM BICARBONATE 8.4 % IV SOLN
50.0000 meq | Freq: Once | INTRAVENOUS | Status: AC
  Administered 2016-11-29: 50 meq via INTRAVENOUS
  Filled 2016-11-29: qty 50

## 2016-11-29 MED ORDER — PROCHLORPERAZINE EDISYLATE 5 MG/ML IJ SOLN
10.0000 mg | Freq: Four times a day (QID) | INTRAMUSCULAR | Status: DC | PRN
  Filled 2016-11-29: qty 2

## 2016-11-29 MED ORDER — AMLODIPINE BESYLATE 10 MG PO TABS
10.0000 mg | ORAL_TABLET | Freq: Every day | ORAL | Status: DC
  Administered 2016-11-29 – 2016-11-30 (×2): 10 mg via ORAL
  Filled 2016-11-29 (×3): qty 1

## 2016-11-29 MED ORDER — CALCITRIOL 0.5 MCG PO CAPS
1.5000 ug | ORAL_CAPSULE | ORAL | Status: DC
  Administered 2016-11-29: 1.5 ug via ORAL
  Filled 2016-11-29: qty 3

## 2016-11-29 MED ORDER — SENNOSIDES-DOCUSATE SODIUM 8.6-50 MG PO TABS
1.0000 | ORAL_TABLET | Freq: Every evening | ORAL | Status: DC | PRN
  Filled 2016-11-29: qty 1

## 2016-11-29 MED ORDER — GABAPENTIN 100 MG PO CAPS
100.0000 mg | ORAL_CAPSULE | Freq: Every day | ORAL | Status: DC
  Administered 2016-11-30: 100 mg via ORAL
  Filled 2016-11-29: qty 1

## 2016-11-29 MED ORDER — SUCRALFATE 1 GM/10ML PO SUSP
1.0000 g | Freq: Three times a day (TID) | ORAL | Status: DC
  Administered 2016-11-29 – 2016-12-01 (×7): 1 g via ORAL
  Filled 2016-11-29 (×7): qty 10

## 2016-11-29 MED ORDER — ONDANSETRON HCL 4 MG/2ML IJ SOLN: 4.0000 mg | Freq: Three times a day (TID) | INTRAMUSCULAR | Status: DC | PRN

## 2016-11-29 MED ORDER — HEPARIN SODIUM (PORCINE) 5000 UNIT/ML IJ SOLN
5000.0000 [IU] | Freq: Three times a day (TID) | INTRAMUSCULAR | Status: DC
  Filled 2016-11-29 (×4): qty 1

## 2016-11-29 MED ORDER — TRAMADOL HCL 50 MG PO TABS
50.0000 mg | ORAL_TABLET | Freq: Four times a day (QID) | ORAL | Status: DC | PRN
  Administered 2016-11-29 – 2016-12-01 (×4): 50 mg via ORAL
  Filled 2016-11-29 (×5): qty 1

## 2016-11-29 MED ORDER — ONDANSETRON HCL 4 MG/2ML IJ SOLN
4.0000 mg | Freq: Once | INTRAMUSCULAR | Status: AC
  Administered 2016-11-29: 4 mg via INTRAVENOUS
  Filled 2016-11-29: qty 2

## 2016-11-29 MED ORDER — ALBUTEROL SULFATE (2.5 MG/3ML) 0.083% IN NEBU
10.0000 mg | INHALATION_SOLUTION | Freq: Once | RESPIRATORY_TRACT | Status: AC
  Administered 2016-11-29: 10 mg via RESPIRATORY_TRACT
  Filled 2016-11-29: qty 12

## 2016-11-29 MED ORDER — MORPHINE SULFATE (PF) 4 MG/ML IV SOLN
2.0000 mg | Freq: Once | INTRAVENOUS | Status: AC
  Administered 2016-11-29: 2 mg via INTRAVENOUS

## 2016-11-29 MED ORDER — LIDOCAINE VISCOUS 2 % MT SOLN
15.0000 mL | Freq: Four times a day (QID) | OROMUCOSAL | Status: DC | PRN
  Filled 2016-11-29: qty 15

## 2016-11-29 MED ORDER — INSULIN ASPART 100 UNIT/ML ~~LOC~~ SOLN
10.0000 [IU] | Freq: Once | SUBCUTANEOUS | Status: DC
  Filled 2016-11-29: qty 1

## 2016-11-29 NOTE — ED Notes (Signed)
Pt refuses to tell nursing staff what type of patch he is wearing on his arm.

## 2016-11-29 NOTE — ED Notes (Signed)
Upon getting pt into bed this writer noticed patch on pt top left shoulder pt refusing to tell me what the patch is for. Pt also stating staff is not moving fast enough because he did not have an IV placed before coming to room.

## 2016-11-29 NOTE — ED Provider Notes (Addendum)
[facility] DEPT Provider Note   CSN: [id number] Arrival date & time: 11/29/16  1246     History   Chief Complaint Chief Complaint  Patient presents with  . Emesis    HPI Frank [name] is a 51 y.o. male.  HPI Patient presents to the emergency room with complaints of abdominal pain and back pain. Patient states he has been having episodes of nausea and vomiting causing him to miss dialysis. Symptoms started on Tuesday. History of chronic abdominal pain and back pain. Patient last had a CT scan in March of this year showing mild to moderate fluid-filled distention of his bowel concerning for enterocolitis. Patient missed his dialysis treatment on Wednesday. He is also scheduled for today and did not go. Patient denies any fevers. He is complaining of persistent pain in his abdomen and his back. Past Medical History:  Diagnosis Date  . Anemia   . Anxiety   . Chronic combined systolic and diastolic CHF (congestive heart failure) (HCC)    a. EF 20-25% by echo in 08/2015 b. echo 10/2015: EF 35-40%, diffuse HK, severe LAE, moderate RAE, small pericardial effusion  . Complication of anesthesia    itching, sore throat  . Depression   . Dialysis patient ([place])   . ESRD (end stage renal disease) ([place])    due to HTN per patient, followed at [facility], s/p failed kidney transplant - dialysis Tue, Th, Sat  . Hyperkalemia 12/2015  . Hypertension   . Junctional rhythm    a. noted in 08/2015: hyperkalemic at that time  b. 12/2015: presented in junctional rhythm w/ K+ of 6.6. Resolved with improvement of K+ levels.  . Nonischemic cardiomyopathy ([place])    a. 08/2014: cath showing minimal CAD, but tortuous arteries noted.   . Personal history of DVT (deep vein thrombosis)/ PE 05/26/2016   In Oct 2015 had small subsemental LUL PE w/o DVT (LE dopplers neg) and was felt to be HD cath related, treated w coumadin.  IN May 2016 had small vein DVT (acute/subacute) in the R basilic/ brachial veins of the RUE,  resumed on coumadin.  Had R sided HD cath at that time.    . Renal insufficiency   . Shortness of breath   . Type II diabetes mellitus (HCC)    No history per patient, but remains under history as A1c would not be accurate given on dialysis    Patient Active Problem List   Diagnosis Date Noted  . Hypertensive emergency   . Dissection of other specified artery ([place]) 08/04/2016  . Abdominal pain 08/04/2016  . End-stage renal disease on hemodialysis ([place])   . Subtherapeutic international normalized ratio (INR)   . Hypertension   . Problem with dialysis access ([place]) 06/22/2016  . ESRD (end stage renal disease) on dialysis ([place])   . ESRD (end stage renal disease) ([place]) 06/20/2016  . ESRD needing dialysis ([place]) 06/14/2016  . Uremia 06/07/2016  . Hyperkalemia 05/29/2016  . Chronic back pain 05/29/2016  . GERD (gastroesophageal reflux disease) 05/29/2016  . Personal history of DVT (deep vein thrombosis)/ PE 05/26/2016  . Bradycardia 03/31/2016  . Nonischemic cardiomyopathy ([place]) 01/09/2016  . Bilateral low back pain without sciatica   . Left renal mass 10/30/2015  . Constipation by delayed colonic transit 10/30/2015  . Acute on chronic combined systolic and diastolic congestive heart failure ([place]) 09/23/2015  . Hypertensive urgency 09/08/2015  . Adjustment disorder with mixed anxiety and depressed mood 08/20/2015  . Chronic epididymitis 06/19/2015  .  Groin pain, chronic, right 06/19/2015  . Incarcerated right inguinal hernia 02/16/2015  . Essential hypertension 01/02/2015  . Dyslipidemia   . ESRD on hemodialysis ([place])   . DM (diabetes mellitus), type 2, uncontrolled, with renal complications ([place])   . Type II diabetes mellitus ([place]) 05/08/2014  . Anemia of chronic kidney failure 06/24/2013    Past Surgical History:  Procedure Laterality Date  . CAPD INSERTION    . CAPD REMOVAL    . INGUINAL HERNIA REPAIR Right 02/14/2015   Procedure: REPAIR INCARCERATED RIGHT INGUINAL HERNIA;   Surgeon: [name], MD;  Location: [facility];  Service: General;  Laterality: Right;  . INSERTION OF DIALYSIS CATHETER Right 09/23/2015   Procedure: exchange of Right internal Dialysis Catheter.;  Surgeon: [name], MD;  Location: [facility];  Service: Vascular;  Laterality: Right;  . IR GENERIC HISTORICAL  07/16/2016   IR US GUIDE VASC ACCESS LEFT 07/16/2016 Corrie Mckusick, DO [facility]-INTERV RAD  . IR GENERIC HISTORICAL Left 07/16/2016   IR THROMBECTOMY AV FISTULA W/THROMBOLYSIS/PTA INC/SHUNT/IMG LEFT 07/16/2016 Corrie Mckusick, DO [facility]-INTER[facility]  . KIDNEY RECEIPIENT  2006   failed and started HD in March 2014  . LEFT HEART CATHETERIZATION WITH CORONARY ANGIOGRAM N/A 09/02/2014   Procedure: LEFT HEART CATHETERIZATION WITH CORONARY ANGIOGRAM;  Surgeon: [name], MD;  Location: [facility] CATH LAB;  Service: Cardiovascular;  Laterality: N/A;       Home Medications    Prior to Admission medications   Medication Sig Start Date End Date Taking? Authorizing Provider  amLODipine (NORVASC) 5 MG tablet Take 1 tablet (5 mg total) by mouth at bedtime. Patient taking differently: Take 10 mg by mouth at bedtime.  08/08/16   Nita Sells, MD    Family History Family History  Problem Relation Age of Onset  . Hypertension Other     Social History Social History  Substance Use Topics  . Smoking status: Former Smoker    Packs/day: 0.00    Years: 1.00    Types: Cigarettes  . Smokeless tobacco: Never Used     Comment: quit Jan 2014  . Alcohol use No     Allergies   Butalbital-apap-caffeine; Ferrlecit [na ferric gluc cplx in sucrose]; Minoxidil; Tylenol [acetaminophen]; and Darvocet [propoxyphene n-acetaminophen]   Review of Systems Review of Systems  All other systems reviewed and are negative.    Physical Exam Updated Vital Signs BP (!) 157/111   Pulse (!) 49   Temp 97.4 F (36.3 C) (Oral)   Resp 20   SpO2 100%   Physical Exam  HENT:  Head: Normocephalic and atraumatic.  Right  Ear: External ear normal.  Left Ear: External ear normal.  Eyes: Conjunctivae are normal. Right eye exhibits no discharge. Left eye exhibits no discharge. No scleral icterus.  Neck: Neck supple. No tracheal deviation present.  Cardiovascular: Normal rate, regular rhythm and intact distal pulses.   Pulmonary/Chest: Effort normal and breath sounds normal. No stridor. No respiratory distress. He has no wheezes. He has no rales.  Abdominal: Soft. Bowel sounds are normal. He exhibits no distension. There is tenderness. There is no rebound and no guarding.  Patient's abdomen is soft, does have diffuse generalized tenderness  Musculoskeletal: He exhibits no edema or tenderness.  Neurological: He is alert. He has normal strength. No cranial nerve deficit (no facial droop, extraocular movements intact, no slurred speech) or sensory deficit. He exhibits normal muscle tone. He displays no seizure activity. Coordination normal.  Skin: Skin is warm and dry.  No rash noted. He is not diaphoretic.  Psychiatric: He has a normal mood and affect.  Nursing note and vitals reviewed.    ED Treatments / Results  Labs (all labs ordered are listed, but only abnormal results are displayed) Labs Reviewed  COMPREHENSIVE METABOLIC PANEL - Abnormal; Notable for the following:       Result Value   Potassium >7.5 (*)    Chloride 96 (*)    BUN 58 (*)    Creatinine, Ser 14.71 (*)    Calcium 8.7 (*)    ALT 12 (*)    Alkaline Phosphatase 185 (*)    GFR calc non Af Amer 3 (*)    GFR calc Af Amer 4 (*)    Anion gap 16 (*)    All other components within normal limits  CBC - Abnormal; Notable for the following:    RBC 4.00 (*)    Hemoglobin 10.6 (*)    HCT 33.8 (*)    RDW 19.6 (*)    All other components within normal limits  LIPASE, BLOOD    EKG  EKG Interpretation  Date/Time:  Friday Nov 29 2016 14:18:22 EDT Ventricular Rate:  50 PR Interval:  252 QRS Duration: 114 QT Interval:  578 QTC  Calculation: 526 R Axis:   -77 Text Interpretation:  Sinus bradycardia with sinus arrhythmia with 1st degree A-V block Left anterior fascicular block ST & T wave abnormality, consider lateral ischemia Prolonged QT Abnormal ECG Confirmed by Navy Rothschild  MD-J, Jovian Lembcke ([id number]) on 11/29/2016 2:47:27 PM       Radiology No results found.  Procedures .Critical Care Performed by: Dorie Rank Authorized by: Dorie Rank   Critical care provider statement:    Critical care time (minutes):  30   Critical care was time spent personally by me on the following activities:  Discussions with consultants, evaluation of patient's response to treatment, examination of patient, ordering and performing treatments and interventions, ordering and review of laboratory studies, ordering and review of radiographic studies, pulse oximetry, re-evaluation of patient's condition, obtaining history from patient or surrogate and review of old charts    (including critical care time)  Medications Ordered in ED Medications  calcium gluconate inj 10% (1 g) URGENT USE ONLY! (not administered)  sodium bicarbonate injection 50 mEq (not administered)  dextrose 50 % solution 50 mL (not administered)  insulin aspart (novoLOG) injection 10 Units (not administered)  albuterol (PROVENTIL) (2.5 MG/3ML) 0.083% nebulizer solution 10 mg (10 mg Nebulization Given 11/29/16 1448)     Initial Impression / Assessment and Plan / ED Course  I have reviewed the triage vital signs and the nursing notes.  Pertinent labs & imaging results that were available during my care of the patient were reviewed by me and considered in my medical decision making (see chart for details).  Clinical Course as of Nov 29 1456  Fri Nov 29, 2016  1442 Patient's laboratory tests are notable for profound hyperkalemia associated with his chronic renal failure.   Patient is at risk for acute cardiac dysrhythmia. Calcium albuterol and sodium bicarbonate ordered. I also  ordered glucose and insulin.  [JK]    Clinical Course User Index [JK] Dorie Rank, MD   The patient has significant hyperkalemia associated with his noncompliance.  Patient's chief complaint is abdominal pain and back pain. He has a history of recurrent issues with chronic abdominal and back pain. Previous CT scans did not show any evidence of any aortic aneurysm or other emergent etiology.  I do not think the patient requires immediate CT scanning. I think because of his instability he will require dialysis first. I will consult with medical service for admission.  I spoke with Dr Mercy Moore who will evaluate for dialysis  Final Clinical Impressions(s) / ED Diagnoses   Final diagnoses:  Hyperkalemia  Medical non-compliance  Abdominal pain, unspecified abdominal location      Dorie Rank, MD 11/29/16 1458 PT requested additional meds for nausea.  He does have prolonged QT.  Will need to monitor closely.  Pt was given hyperkalemia treatment.  Emergent dialysis is planned   Dorie Rank, MD 11/29/16 1538

## 2016-11-29 NOTE — H&P (Signed)
Date: 11/29/2016               Patient Name:  Frank [name] MRN: [id number]  DOB: [birth year] Age / Sex: 51 y.o., male   PCP: Patient, [name]         Medical Service: Internal Medicine Teaching Service         Attending Physician: Dr. Evette Doffing, Mallie Mussel, *    First Contact: Dr. Einar Gip, DO Pager: [phone]  Second Contact: Dr. Ignacia Marvel, MD Pager: [phone]       After Hours (After 5p/  First Contact Pager: [phone]  weekends / holidays): Second Contact Pager: [phone]   Chief Complaint: nausea, vomiting, abdominal pain. Missed HD.  History of Present Illness:  51 y/o M with extensive medical history including ESRD on HD MWF with frequent non-adherence, chronic recurrent abdominal pain, HTN, Hx of DM2, anxiety and history of drug-seeking behavior who presents from home with complaints of worsening of his chronic epigastric abdominal pain and low back pain. Has had nausea and NBNB vomiting for 3 days. Pain is worse with food and laying down at night and describes the pain as feeling like something is "spoiled" in his stomach. The only thing that relieves his pain per patient is oxycodone and sitting upright. Last BM today and was normal without blood or melena. He has been unable to attend dialysis sessions this week secondary to these complaints. He denies any fevers, chills, chest pain, SOB, diarrhea, sick contacts or new foods.   Chart review shows many ED presentations for the same complaint, namely at least 21 ED visits and 11 admissions in past 6 months with the majority of these visits strikingly similar to current presentation. He also has presented to several other EDs for the same presentation. He has had numerous CTs and work-up of his abdomen/pelvis without explanation for his pain. His abdominal pain, nausea and vomiting appear to improve after HD and he is then able to tolerate PO intake well.   In the ED he was afebrile, hypertensive at 157/111, initially with sinus  bradycardia with rate of 49 and he was saturating well on RA. CMET with hyperkalemia >7.5, BUN 58 and alk phose of 185. Lipase normal. CBC with stable chronic normocytic anemia with Hb of 10.6. EKG with peaked t-waves. He was given IV Calcium gluconate, albuterol, insulin and kayexalate and IMTS was contacted for admission. He requested a diet immediately upon arriving to the floor.   Meds:  Current Meds  Medication Sig  . amLODipine (NORVASC) 5 MG tablet Take 1 tablet (5 mg total) by mouth at bedtime. (Patient taking differently: Take 10 mg by mouth at bedtime. )  . doxazosin (CARDURA) 4 MG tablet Take 4 mg by mouth at bedtime.  . famotidine (PEPCID) 20 MG tablet Take 20 mg by mouth 2 (two) times daily.  [name] FLUoxetine (PROZAC) 40 MG capsule Take 40 mg by mouth every morning.  . gabapentin (NEURONTIN) 100 MG capsule Take 100 mg by mouth at bedtime.   Allergies: Allergies as of 11/29/2016 - Review Complete 11/01/2016  Allergen Reaction Noted  . Butalbital-apap-caffeine Shortness Of Breath, Swelling, and Other (See Comments) 08/18/2015  . Ferrlecit [na ferric gluc cplx in sucrose] Shortness Of Breath, Swelling, and Other (See Comments) 06/24/2013  . Minoxidil Shortness Of Breath 08/18/2015  . Tylenol [acetaminophen] Anaphylaxis and Swelling 06/25/2016  . Darvocet [propoxyphene n-acetaminophen] Hives 10/16/2011   Past Medical History:  Diagnosis Date  . Anemia   .  Anxiety   . Chronic combined systolic and diastolic CHF (congestive heart failure) (HCC)    a. EF 20-25% by echo in 08/2015 b. echo 10/2015: EF 35-40%, diffuse HK, severe LAE, moderate RAE, small pericardial effusion  . Complication of anesthesia    itching, sore throat  . Depression   . Dialysis patient ([place])   . ESRD (end stage renal disease) ([place])    due to HTN per patient, followed at [facility], s/p failed kidney transplant - dialysis Tue, Th, Sat  . Hyperkalemia 12/2015  . Hypertension   . Junctional rhythm    a. noted in  08/2015: hyperkalemic at that time  b. 12/2015: presented in junctional rhythm w/ K+ of 6.6. Resolved with improvement of K+ levels.  . Nonischemic cardiomyopathy ( [place])    a. 08/2014: cath showing minimal CAD, but tortuous arteries noted.   . Personal history of DVT (deep vein thrombosis)/ PE 05/26/2016   In Oct 2015 had small subsemental LUL PE w/o DVT (LE dopplers neg) and was felt to be HD cath related, treated w coumadin.  IN May 2016 had small vein DVT (acute/subacute) in the R basilic/ brachial veins of the RUE, resumed on coumadin.  Had R sided HD cath at that time.    . Renal insufficiency   . Shortness of breath   . Type II diabetes mellitus (HCC)    No history per patient, but remains under history as A1c would not be accurate given on dialysis   Family History: Denied any history other than non-specific hypertension.   Social History: Former smoker, quit in 2014. 1 pack-year history. Denied any alcohol or drug use.   Review of Systems: A complete ROS was negative except as per HPI.   Does complain of back pain often and notes he's been seeing a chiropractor for his back pain who showed him X-rays which showed he had "a lot of calcium in his bones" and is pleased to know the reason behind is pain.   Physical Exam: Blood pressure 139/86, pulse 60, temperature 97.4 F (36.3 C), temperature source Oral, resp. rate 17, SpO2 100 %. General: Chronically-ill appearing african american male laying on right side in bed. Holding emesis bag with bilious-tinged emesis. HENT: PERRL. EOMI. Oropharynx clear, mucous membranes moist.  Cardiovascular: Regular rate and rhythm, rate ~60. No murmur or rub appreciated. Pulmonary: Lungs CTA BL without wheezing or crackles. Normal WOB. No cough. Abdomen: Soft and non-distended. Tender to light palpation diffusely but moreso in epigastrum. +bs.  Extremities: No peripheral edema noted BL. No gross deformities. Skin: Warm, very dry. No cyanosis.  Neuro:  Alert and oriented x3. Strength and sensation grossly intact throughout. Psych: Mood labile and affect congruent. Answers questions appropriately.  EKG: Sinus bradycardia with rate of 50. 1st degree AV-block. Peaked T-waves. Prolonged QT.  CXR: pending  Assessment & Plan by Problem: Active Problems:   Anemia of chronic kidney failure   ESRD on hemodialysis (HCC)   Essential hypertension   Hyperkalemia   GERD (gastroesophageal reflux disease)  #1 Severe Hyperkalemia: K>7.5 on admission. Secondary to HD non-adherence and leaving sessions early. EKG with peaked T-waves, sinus bradycardia and 1st degree AV-block. S/p IV calcium gluconate, IV insulin, albuterol and Kayexalate. Nephrology consulted and will be having HD tonight and again tomorrow. Follow electrolytes.   #2 Acute on Chronic Epigastric Abdominal Pain + Vomiting: chronic issue for the patient and he has presented to this ED and several others for evaluation over the past year. Has had  thorough work-up including multiple CT's which have not identified a cause. Historically speaking, N/V and abdominal pain improve rapidly after HD sessions. He has already requested a diet. -Zofran PRN -Carafate -Maalox -Ultram 50 mg   #3ESRD on HD MWF: Long history of non-compliance with HD sessions. Nephro on board, appreciate their assistance. He will be dialyzed tonight and tomorrow. BMD meds per nephro.   #4GERD: Describes history of GERD for which he takes protonix at home intermittently. Reports recent non-compliance and subsequent worsening of his epigastric pain, nausea and vomiting.  -Famotidine 20 mg QHS -Protonix BID  #5 HTN: Prescribed Amlodipine 10 mg and Doxazosin 4 mg at home. These have been continued. Monitor pressures.   #6 Drug-seeking Behavior: Reviewed the [place] controlled substance database with multiple opioid prescriptions from multiple providers. He reports he takes Percocet at home and was last prescribed it 2 months ago.  Caution with opioids.   #7 ?Hx of Type 2 Diabetes with peripheral neuropathy: CBG 172 on admission. Most recent A1c 09/2015 was 4.6%. Not on any meds at home other than gabapentin. Gabapentin continued.  #8 Combined Chronic Systolic and Diastolic CHF, NICM: Most recent ECHO 10/2015 with LVEF of 35-40% severe concentric hypertrophy with diffuse wall hypokinesis. He is euvolemic on examination and volume status is managed with HD.   #9 Anemia of chronic disease: Hb 10 today.   Diet: Renal Code Status: FULL - this was discussed with patient on admission.  IVF: None DVT prophylaxis: Heparin Disposition: home Consult: Nephro  Dispo: Admit patient to Observation with expected length of stay less than 2 midnights.  SignedEinar Gip, DO 11/29/2016, 3:25 PM  Pager: [phone]

## 2016-11-29 NOTE — ED Triage Notes (Signed)
Pt reports n/v/ abdominal pain since Tuesday, missed dialysis Wednesday and today due to being sick.

## 2016-11-29 NOTE — ED Notes (Signed)
Attempted IV X1 in pt right AC, unable to thread r/t scar tissue. Pt stating "you did not go deep enough, you have to push through that". Explained to pt that I would try another site that I felt more comfortable in. Pt refused. Explained to pt I would have another RN attempt.

## 2016-11-29 NOTE — Consult Note (Signed)
[facility] Renal Consultation Note    Indication for Consultation:  Management of ESRD/hemodialysis; anemia, hypertension/volume and secondary hyperparathyroidism  HPI: Frank [name] is a 51 y.o. male with PMH ESRD on HD, HTN, DM, NICM, CHF,h /o DVT/PE  h/o noncompliance with HD. Recent history notable for frequent visits to ED with c/o of abdominal pain and SOB with two admissions for hyperkalemia in March. Had abdominal CT in March with mild fluid distention of small bowel suspicious for an enterocolitis.   Seen in ED. Today he presented to ED with back and abdominal pain that started Tuesday of this week and found to have K >7.5. His last HD was Monday 11/25/16. He says he was feeling too bad to go to treatments. He endorses nausea and vomiting, with last episode this morning when he woke up. Reports yellow emesis no blood. He says today he also started having SOB so he came to ED. Got breathing treatment which has helped. Will be admitted under observation status.   He has a history of missing HD,  cutting treatments short with various excuses and not adhering to prescribed meds. Completed full HD treatment on 5/7 and reached target weight.   Past Medical History:  Diagnosis Date  . Anemia   . Anxiety   . Chronic combined systolic and diastolic CHF (congestive heart failure) (HCC)    a. EF 20-25% by echo in 08/2015 b. echo 10/2015: EF 35-40%, diffuse HK, severe LAE, moderate RAE, small pericardial effusion  . Complication of anesthesia    itching, sore throat  . Depression   . Dialysis patient ([place])   . ESRD (end stage renal disease) ([place])    due to HTN per patient, followed at [facility], s/p failed kidney transplant - dialysis Tue, Th, Sat  . Hyperkalemia 12/2015  . Hypertension   . Junctional rhythm    a. noted in 08/2015: hyperkalemic at that time  b. 12/2015: presented in junctional rhythm w/ K+ of 6.6. Resolved with improvement of K+ levels.  . Nonischemic cardiomyopathy  ([place])    a. 08/2014: cath showing minimal CAD, but tortuous arteries noted.   . Personal history of DVT (deep vein thrombosis)/ PE 05/26/2016   In Oct 2015 had small subsemental LUL PE w/o DVT (LE dopplers neg) and was felt to be HD cath related, treated w coumadin.  IN May 2016 had small vein DVT (acute/subacute) in the R basilic/ brachial veins of the RUE, resumed on coumadin.  Had R sided HD cath at that time.    . Renal insufficiency   . Shortness of breath   . Type II diabetes mellitus (HCC)    No history per patient, but remains under history as A1c would not be accurate given on dialysis   Past Surgical History:  Procedure Laterality Date  . CAPD INSERTION    . CAPD REMOVAL    . INGUINAL HERNIA REPAIR Right 02/14/2015   Procedure: REPAIR INCARCERATED RIGHT INGUINAL HERNIA;  Surgeon: [name], MD;  Location: [place];  Service: General;  Laterality: Right;  . INSERTION OF DIALYSIS CATHETER Right 09/23/2015   Procedure: exchange of Right internal Dialysis Catheter.;  Surgeon: [name], MD;  Location: [place];  Service: Vascular;  Laterality: Right;  . IR GENERIC HISTORICAL  07/16/2016   IR US GUIDE VASC ACCESS LEFT 07/16/2016 Corrie Mckusick, DO [facility]  . IR GENERIC HISTORICAL Left 07/16/2016   IR THROMBECTOMY AV FISTULA W/THROMBOLYSIS/PTA INC/SHUNT/IMG LEFT 07/16/2016 Corrie Mckusick, DO [facility]  .  KIDNEY RECEIPIENT  2006   failed and started HD in March 2014  . LEFT HEART CATHETERIZATION WITH CORONARY ANGIOGRAM N/A 09/02/2014   Procedure: LEFT HEART CATHETERIZATION WITH CORONARY ANGIOGRAM;  Surgeon: [name], MD;  Location: [facility] CATH LAB;  Service: Cardiovascular;  Laterality: N/A;   Family History  Problem Relation Age of Onset  . Hypertension Other    Social History:  reports that he has quit smoking. His smoking use included Cigarettes. He smoked 0.00 packs per day for 1.00 year. He has never used smokeless tobacco. He reports that he does not drink alcohol or use  drugs. Allergies  Allergen Reactions  . Butalbital-Apap-Caffeine Shortness Of Breath, Swelling and Other (See Comments)    Swelling in throat  . Ferrlecit [Na Ferric Gluc Cplx In Sucrose] Shortness Of Breath, Swelling and Other (See Comments)    Swelling in throat, tolerates Venofor  . Minoxidil Shortness Of Breath  . Tylenol [Acetaminophen] Anaphylaxis and Swelling  . Darvocet [Propoxyphene N-Acetaminophen] Hives   Prior to Admission medications   Medication Sig Start Date End Date Taking? Authorizing Provider  amLODipine (NORVASC) 5 MG tablet Take 1 tablet (5 mg total) by mouth at bedtime. Patient taking differently: Take 10 mg by mouth at bedtime.  08/08/16   Nita Sells, MD   Current Facility-Administered Medications  Medication Dose Route Frequency Provider Last Rate Last Dose  . calcium gluconate inj 10% (1 g) URGENT USE ONLY!  1 g Intravenous Once Harris, Abigail, PA-C      . dextrose 50 % solution 50 mL  50 mL Intravenous Once Dorie Rank, MD      . insulin aspart (novoLOG) injection 10 Units  10 Units Subcutaneous Once Dorie Rank, MD      . sodium bicarbonate injection 50 mEq  50 mEq Intravenous Once Margarita Mail, PA-C       Current Outpatient Prescriptions  Medication Sig Dispense Refill  . amLODipine (NORVASC) 5 MG tablet Take 1 tablet (5 mg total) by mouth at bedtime. (Patient taking differently: Take 10 mg by mouth at bedtime. ) 30 tablet 1    ROS: As per HPI otherwise negative.  Physical Exam: Vitals:   11/29/16 1253 11/29/16 1430 11/29/16 1450  BP: (!) 157/111 139/86   Pulse: (!) 49 60   Resp: 20 17   Temp: 97.4 F (36.3 C)    TempSrc: Oral    SpO2: 100% 95% 100%     General: WDWN AAM lying on side  Head: NCAT sclera not icteric MMM Neck: Supple. No JVD No masses Lungs: CTA bilaterally without wheezes, rales, or rhonchi. Breathing is unlabored. Heart: RRR with S1 S2 Abdomen: soft NT + BS Lower extremities:without edema or ischemic changes, no  open wounds  Neuro: A & O  X 3. Moves all extremities spontaneously. Psych:  Responds to questions appropriately with a normal affect. Dialysis Access: LU AVF +bruit  Labs: Basic Metabolic Panel:  Recent Labs Lab 11/29/16 1253  NA 135  K >7.5*  CL 96*  CO2 23  GLUCOSE 80  BUN 58*  CREATININE 14.71*  CALCIUM 8.7*   Liver Function Tests:  Recent Labs Lab 11/29/16 1253  AST 20  ALT 12*  ALKPHOS 185*  BILITOT 0.9  PROT 7.9  ALBUMIN 3.5    Recent Labs Lab 11/29/16 1253  LIPASE 22   No results for input(s): AMMONIA in the last 168 hours. CBC:  Recent Labs Lab 11/29/16 1253  WBC 4.9  HGB 10.6*  HCT 33.8*  MCV 84.5  PLT 199   Cardiac Enzymes: No results for input(s): CKTOTAL, CKMB, CKMBINDEX, TROPONINI in the last 168 hours. CBG: No results for input(s): GLUCAP in the last 168 hours. Iron Studies: No results for input(s): IRON, TIBC, TRANSFERRIN, FERRITIN in the last 72 hours. Studies/Results: No results found.  Dialysis Orders:  [facility] MWF 4h 180 F BFR 400 2K/2.25 Ca EDW 72.5 kg  -Heparin 2000 U IV bolus L AVF  -Calcitriol 1.5 mcg PO q HD -Mircera 200 mcg IV q 2 weeks (Last  dose 5/3) -Venofer 50 mg IV q week (Last dose 5/3)   Assessment/Plan: 1.  Hyperkalemia  secondary to missed  HD-  K >7.5 - Getting Ca gluconate/insulin in ED - For emergent HD today and follow K  2.  ESRD -  MWF - Cont on schedule  3. Abdominal pain N/V - recurrent issues last CT scan in March without acute findings - w/u per primary  4.  Hypertension/volume  - BP elevated poor compliance with OP meds/ Not volume overloaded by exam UF to EDW today 5.  Anemia  - Hgb 10.3 on OP ESA - follow  6.  Metabolic bone disease -  Cont VDRA/ Renvela binder/Sensipar - follow renal panel  7.  Nutrition - Renal diet/vitamins 8. DM - per primary   Lynnda Child PA-C [place] Pager [phone] 11/29/2016, 3:15 PM

## 2016-11-29 NOTE — ED Notes (Signed)
Admitting MDs at bedside.

## 2016-11-29 NOTE — Procedures (Signed)
Patient seen and examined on Hemodialysis. QB 400, UF goal 4L.  Appears comfortable.  Will need HD tomorrow (5/12) as well to make up for missed OP session.  Treatment adjusted as needed.  Madelon Lips MD [facility] 6:47 PM

## 2016-11-29 NOTE — ED Notes (Signed)
CBG 134

## 2016-11-30 ENCOUNTER — Encounter (HOSPITAL_COMMUNITY): Payer: Self-pay | Admitting: General Practice

## 2016-11-30 DIAGNOSIS — Z7289 Other problems related to lifestyle: Secondary | ICD-10-CM

## 2016-11-30 DIAGNOSIS — E1142 Type 2 diabetes mellitus with diabetic polyneuropathy: Secondary | ICD-10-CM | POA: Diagnosis present

## 2016-11-30 DIAGNOSIS — R112 Nausea with vomiting, unspecified: Secondary | ICD-10-CM

## 2016-11-30 DIAGNOSIS — R109 Unspecified abdominal pain: Secondary | ICD-10-CM | POA: Diagnosis not present

## 2016-11-30 DIAGNOSIS — E1129 Type 2 diabetes mellitus with other diabetic kidney complication: Secondary | ICD-10-CM | POA: Diagnosis not present

## 2016-11-30 DIAGNOSIS — N186 End stage renal disease: Secondary | ICD-10-CM

## 2016-11-30 DIAGNOSIS — E1122 Type 2 diabetes mellitus with diabetic chronic kidney disease: Secondary | ICD-10-CM | POA: Diagnosis not present

## 2016-11-30 DIAGNOSIS — E875 Hyperkalemia: Principal | ICD-10-CM

## 2016-11-30 DIAGNOSIS — Z992 Dependence on renal dialysis: Secondary | ICD-10-CM | POA: Diagnosis not present

## 2016-11-30 DIAGNOSIS — N179 Acute kidney failure, unspecified: Secondary | ICD-10-CM | POA: Diagnosis not present

## 2016-11-30 DIAGNOSIS — I120 Hypertensive chronic kidney disease with stage 5 chronic kidney disease or end stage renal disease: Secondary | ICD-10-CM | POA: Diagnosis not present

## 2016-11-30 DIAGNOSIS — K219 Gastro-esophageal reflux disease without esophagitis: Secondary | ICD-10-CM | POA: Diagnosis not present

## 2016-11-30 DIAGNOSIS — Z9119 Patient's noncompliance with other medical treatment and regimen: Secondary | ICD-10-CM | POA: Diagnosis not present

## 2016-11-30 DIAGNOSIS — I5043 Acute on chronic combined systolic (congestive) and diastolic (congestive) heart failure: Secondary | ICD-10-CM | POA: Diagnosis not present

## 2016-11-30 DIAGNOSIS — Z765 Malingerer [conscious simulation]: Secondary | ICD-10-CM | POA: Diagnosis not present

## 2016-11-30 DIAGNOSIS — G8929 Other chronic pain: Secondary | ICD-10-CM | POA: Diagnosis not present

## 2016-11-30 DIAGNOSIS — I440 Atrioventricular block, first degree: Secondary | ICD-10-CM | POA: Diagnosis present

## 2016-11-30 DIAGNOSIS — Z9115 Patient's noncompliance with renal dialysis: Secondary | ICD-10-CM

## 2016-11-30 DIAGNOSIS — Z87891 Personal history of nicotine dependence: Secondary | ICD-10-CM | POA: Diagnosis not present

## 2016-11-30 DIAGNOSIS — Z79899 Other long term (current) drug therapy: Secondary | ICD-10-CM | POA: Diagnosis not present

## 2016-11-30 DIAGNOSIS — Z86718 Personal history of other venous thrombosis and embolism: Secondary | ICD-10-CM | POA: Diagnosis not present

## 2016-11-30 DIAGNOSIS — I132 Hypertensive heart and chronic kidney disease with heart failure and with stage 5 chronic kidney disease, or end stage renal disease: Secondary | ICD-10-CM | POA: Diagnosis not present

## 2016-11-30 DIAGNOSIS — N2581 Secondary hyperparathyroidism of renal origin: Secondary | ICD-10-CM | POA: Diagnosis not present

## 2016-11-30 DIAGNOSIS — D631 Anemia in chronic kidney disease: Secondary | ICD-10-CM | POA: Diagnosis not present

## 2016-11-30 DIAGNOSIS — F329 Major depressive disorder, single episode, unspecified: Secondary | ICD-10-CM | POA: Diagnosis present

## 2016-11-30 LAB — RENAL FUNCTION PANEL
Albumin: 3.1 g/dL — ABNORMAL LOW (ref 3.5–5.0)
Anion gap: 11 (ref 5–15)
BUN: 23 mg/dL — ABNORMAL HIGH (ref 6–20)
CALCIUM: 8.3 mg/dL — AB (ref 8.9–10.3)
CHLORIDE: 94 mmol/L — AB (ref 101–111)
CO2: 30 mmol/L (ref 22–32)
CREATININE: 8.41 mg/dL — AB (ref 0.61–1.24)
GFR, EST AFRICAN AMERICAN: 7 mL/min — AB (ref >60)
GFR, EST NON AFRICAN AMERICAN: 6 mL/min — AB (ref >60)
Glucose, Bld: 75 mg/dL (ref 65–99)
PHOSPHORUS: 5.1 mg/dL — AB (ref 2.5–4.6)
Potassium: 4.6 mmol/L (ref 3.5–5.1)
Sodium: 135 mmol/L (ref 135–145)

## 2016-11-30 LAB — CBC
HCT: 31.9 % — ABNORMAL LOW (ref 39.0–52.0)
HEMOGLOBIN: 10 g/dL — AB (ref 13.0–17.0)
MCH: 26.3 pg (ref 26.0–34.0)
MCHC: 31.3 g/dL (ref 30.0–36.0)
MCV: 83.9 fL (ref 78.0–100.0)
Platelets: 172 10*3/uL (ref 150–400)
RBC: 3.8 MIL/uL — AB (ref 4.22–5.81)
RDW: 19.6 % — ABNORMAL HIGH (ref 11.5–15.5)
WBC: 4.8 10*3/uL (ref 4.0–10.5)

## 2016-11-30 LAB — MAGNESIUM: MAGNESIUM: 1.8 mg/dL (ref 1.7–2.4)

## 2016-11-30 LAB — MRSA PCR SCREENING: MRSA by PCR: POSITIVE — AB

## 2016-11-30 MED ORDER — SODIUM CHLORIDE 0.9 % IV SOLN: 100.0000 mL | INTRAVENOUS | Status: DC | PRN

## 2016-11-30 MED ORDER — LIDOCAINE HCL (PF) 1 % IJ SOLN
5.0000 mL | INTRAMUSCULAR | Status: DC | PRN
  Filled 2016-11-30: qty 5

## 2016-11-30 MED ORDER — PENTAFLUOROPROP-TETRAFLUOROETH EX AERO: 1.0000 "application " | INHALATION_SPRAY | CUTANEOUS | Status: DC | PRN

## 2016-11-30 MED ORDER — HEPARIN SODIUM (PORCINE) 1000 UNIT/ML DIALYSIS
1000.0000 [IU] | INTRAMUSCULAR | Status: DC | PRN
Start: 2016-11-30 — End: 2016-11-30

## 2016-11-30 MED ORDER — HEPARIN SODIUM (PORCINE) 1000 UNIT/ML DIALYSIS: 20.0000 [IU]/kg | INTRAMUSCULAR | Status: DC | PRN

## 2016-11-30 MED ORDER — ALTEPLASE 2 MG IJ SOLR: 2.0000 mg | Freq: Once | INTRAMUSCULAR | Status: DC | PRN

## 2016-11-30 MED ORDER — LIDOCAINE-PRILOCAINE 2.5-2.5 % EX CREA: 1.0000 "application " | TOPICAL_CREAM | CUTANEOUS | Status: DC | PRN

## 2016-11-30 MED ORDER — HEPARIN SODIUM (PORCINE) 1000 UNIT/ML DIALYSIS
20.0000 [IU]/kg | INTRAMUSCULAR | Status: DC | PRN
Start: 2016-11-30 — End: 2016-11-30

## 2016-11-30 MED ORDER — CHLORHEXIDINE GLUCONATE CLOTH 2 % EX PADS
6.0000 | MEDICATED_PAD | Freq: Every day | CUTANEOUS | Status: DC
  Administered 2016-11-30 – 2016-12-01 (×2): 6 via TOPICAL

## 2016-11-30 MED ORDER — HEPARIN SODIUM (PORCINE) 1000 UNIT/ML DIALYSIS: 1000.0000 [IU] | INTRAMUSCULAR | Status: DC | PRN

## 2016-11-30 MED ORDER — MUPIROCIN 2 % EX OINT
1.0000 "application " | TOPICAL_OINTMENT | Freq: Two times a day (BID) | CUTANEOUS | Status: DC
  Administered 2016-11-30 – 2016-12-01 (×3): 1 via NASAL
  Filled 2016-11-30: qty 22

## 2016-11-30 MED ORDER — PENTAFLUOROPROP-TETRAFLUOROETH EX AERO: 1.0000 | INHALATION_SPRAY | CUTANEOUS | Status: DC | PRN

## 2016-11-30 NOTE — Progress Notes (Signed)
  [facility] Progress Note   Assessment/ Plan:   Dialysis Orders:  [facility] MWF 4h 180 F BFR 400 2K/2.25 Ca EDW 72.5 kg  -Heparin 2000 U IV bolus L AVF  -Calcitriol 1.5 mcg PO q HD -Mircera 200 mcg IV q 2 weeks (Last  dose 5/3) -Venofer 50 mg IV q week (Last dose 5/3)   Assessment/Plan: 1.  Hyperkalemia  secondary to missed  HD-  K >7.5 - Getting Ca gluconate/insulin in ED - Emergent HD yesterday, HD today and then back on schedule for Monday. 2.  ESRD -  MWF - Cont on schedule  3. Abdominal pain N/V - recurrent issues last CT scan in March without acute findings - w/u per primary; he reports gnawing abd pain which is worse after eating, ? Consider PUD and utility of GI c/s for scope 4.  Hypertension/volume  - BP elevated poor compliance with OP meds/ Not volume overloaded by exam UF to EDW today 5.  Anemia  - Hgb 10.3 on OP ESA - follow  6.  Metabolic bone disease -  Cont VDRA/ Renvela binder/Sensipar - follow renal panel  7.  Nutrition - Renal diet/vitamins 8. DM - per primary   Subjective:    Still reports abd pain   Objective:   BP (!) (P) 157/95   Pulse (P) 69   Temp 97.3 F (36.3 C) (Oral)   Resp (!) 24   Wt 71 kg (156 lb 8.4 oz)   SpO2 98%   BMI 20.10 kg/m   Physical Exam: General: WDWN AAM lying on side  Neck: Supple. No JVD No masses Lungs: CTA bilaterally without wheezes, rales, or rhonchi. Breathing is unlabored. Heart: RRR with S1 S2 Abdomen: soft NT + BS Lower extremities:without edema or ischemic changes, no open wounds  Neuro: A & O  X 3. Moves all extremities spontaneously. Dialysis Access: LU AVF +bruit  Labs: BMET  Recent Labs Lab 11/29/16 1253 11/30/16 0520  NA 135 135  K >7.5* 4.6  CL 96* 94*  CO2 23 30  GLUCOSE 80 75  BUN 58* 23*  CREATININE 14.71* 8.41*  CALCIUM 8.7* 8.3*  PHOS  --  5.1*   CBC  Recent Labs Lab 11/29/16 1253 11/30/16 0520  WBC 4.9 4.8  HGB 10.6* 10.0*  HCT 33.8* 31.9*  MCV 84.5 83.9  PLT  199 172    _0 @ Medications:    . amLODipine  10 mg Oral QHS  . calcitRIOL  1.5 mcg Oral Q M,W,F-HD  . Chlorhexidine Gluconate Cloth  6 each Topical Q0600  . cinacalcet  30 mg Oral Q supper  . doxazosin  4 mg Oral QHS  . famotidine  20 mg Oral QHS  . gabapentin  100 mg Oral QHS  . heparin  5,000 Units Subcutaneous Q8H  . multivitamin  1 tablet Oral QHS  . mupirocin ointment  1 application Nasal BID  . pantoprazole  40 mg Oral BID AC  . sevelamer carbonate  800 mg Oral TID WC  . sodium chloride flush  3 mL Intravenous Q12H  . sucralfate  1 g Oral TID WC & HS     Madelon Lips MD [facility] 11/30/2016, 11:03 AM

## 2016-11-30 NOTE — Progress Notes (Signed)
Internal Medicine Attending:   I saw and examined the patient. I reviewed the resident's note and I agree with the resident's findings and plan as documented in the resident's note.  Much improved after HD session. Ate a small amount of breakfast. Complaining of abdominal pain which is likely chronic and exacerbated by HD poor compliance.

## 2016-11-30 NOTE — Progress Notes (Signed)
Subjective: Reports he was able to eat a few bites of his breakfast and dinner before getting full. No vomiting. Did not feel that Ultram was helpful. Perseverating about calcium in his bones and aorta. Reports difficulty with attending HD because of nausea and chronic pain in his back and abdomen.   Objective:  Vital signs in last 24 hours: Vitals:   11/30/16 0656 11/30/16 0847 11/30/16 0907 11/30/16 0930  BP: (!) 151/99 (!) 147/94 (!) 134/101 (!) 149/93  Pulse: 68 66 71 64  Resp: 18 (!) 24    Temp: 97.6 F (36.4 C) 97.3 F (36.3 C)    TempSrc: Oral Oral    SpO2: 98%     Weight:  156 lb 8.4 oz (71 kg)     General: In no acute distress. Resting comfortably in bed.  Cardiovascular: Regular rate and rhythm. No murmur or rub appreciated. Pulmonary: CTA BL, no wheezing. Normal WOB. Abdomen: Soft, moderate tenderness throughout. +bs Extremities: Warm, well perfused. No peripheral edema noted BL. Intact distal pulses. Xerosis throughout.  Psych: Mood labile. Perseverates.   Assessment/Plan:  Principal Problem:   Hyperkalemia Active Problems:   Anemia of chronic kidney failure   DM (diabetes mellitus), type 2, uncontrolled, with renal complications (HCC)   ESRD on hemodialysis (HCC)   Essential hypertension   GERD (gastroesophageal reflux disease)   Uremia   Abdominal pain   Acute on chronic combined systolic and diastolic congestive heart failure (HCC)  Uremia, Hyperkalemia, ESRD on HD: HD yesterday and to have another session today. Hyperkalemia resolved as of this AMs lab. Patient appears more comfortable compared to yesterdays examination however still with abdominal pain and back pain. Believe patients presentation is secondary to his long hx of frequent non-compliance with entire HD sessions and skipping sessions secondary to not feeling well from  Uremia. The patient perseverates about his abdominal pain and back pain being from the calcium in his bones and vessels. -HD  today -Hyperkalemia resolved -Renal function panel -BMD meds per nephrology   Chronic abdominal pain with associated N/V: Seen many times for this complaint (>40 times in past year). Seems to be a pattern of missing HD or cutting HD short then presenting to ED with abdominal pain/n/v and requesting HD. He has had many CTs of his abdomen/pelvis which are without identifiable etiology. Likely secondary to uremia although could be component of GERD vs gastroparesis/intestinal dysmotility as well. He would like to have an EGD and although reports history of colonoscopy, do not see evidence for this. Consider outpatient GI referral -HD today -Diet as tolerated. Currently only taking 2-3 bites of meal -Zofran, Carafate  Drug seeking behavior: [place] controlled substance database with many different providers. Pt states last opioid Rx was given 2 months ago. Was 1 month ago. Avoid narcotics except Ultram.  GERD: Continue current management with Protonix, Famotidine and Carafate.   Dispo: Anticipated discharge in approximately 1-2 days pending better PO intake.   Frank Leventhal, DO 11/30/2016, 10:28 AM Pager: [phone]

## 2016-12-01 DIAGNOSIS — E1122 Type 2 diabetes mellitus with diabetic chronic kidney disease: Secondary | ICD-10-CM

## 2016-12-01 DIAGNOSIS — Z886 Allergy status to analgesic agent status: Secondary | ICD-10-CM

## 2016-12-01 DIAGNOSIS — I5043 Acute on chronic combined systolic (congestive) and diastolic (congestive) heart failure: Secondary | ICD-10-CM

## 2016-12-01 DIAGNOSIS — Z888 Allergy status to other drugs, medicaments and biological substances status: Secondary | ICD-10-CM

## 2016-12-01 DIAGNOSIS — Z885 Allergy status to narcotic agent status: Secondary | ICD-10-CM

## 2016-12-01 DIAGNOSIS — I132 Hypertensive heart and chronic kidney disease with heart failure and with stage 5 chronic kidney disease, or end stage renal disease: Secondary | ICD-10-CM

## 2016-12-01 DIAGNOSIS — D631 Anemia in chronic kidney disease: Secondary | ICD-10-CM

## 2016-12-01 MED ORDER — METOCLOPRAMIDE HCL 5 MG PO TABS: 5.0000 mg | ORAL_TABLET | Freq: Three times a day (TID) | ORAL | 1 refills | Status: DC | PRN

## 2016-12-01 MED ORDER — PANTOPRAZOLE SODIUM 40 MG PO TBEC: 40.0000 mg | DELAYED_RELEASE_TABLET | Freq: Two times a day (BID) | ORAL | 2 refills | Status: DC

## 2016-12-01 MED ORDER — GLUCERNA SHAKE PO LIQD
237.0000 mL | Freq: Three times a day (TID) | ORAL | Status: DC
  Administered 2016-12-01: 237 mL via ORAL

## 2016-12-01 MED ORDER — AMLODIPINE BESYLATE 10 MG PO TABS: 10.0000 mg | ORAL_TABLET | Freq: Every day | ORAL | 2 refills | Status: DC

## 2016-12-01 MED ORDER — TRAMADOL HCL 50 MG PO TABS: 50.0000 mg | ORAL_TABLET | Freq: Two times a day (BID) | ORAL | 0 refills | Status: DC | PRN

## 2016-12-01 MED ORDER — METOCLOPRAMIDE HCL 5 MG PO TABS
5.0000 mg | ORAL_TABLET | Freq: Every day | ORAL | Status: AC
  Administered 2016-12-01: 5 mg via ORAL
  Filled 2016-12-01: qty 1

## 2016-12-01 NOTE — Progress Notes (Signed)
  Frank [name] Progress Note   Assessment/ Plan:   Dialysis Orders:  [facility] MWF 4h 180 F BFR 400 2K/2.25 Ca EDW 72.5 kg  -Heparin 2000 U IV bolus L AVF  -Calcitriol 1.5 mcg PO q HD -Mircera 200 mcg IV q 2 weeks (Last  dose 5/3) -Venofer 50 mg IV q week (Last dose 5/3)   Assessment/Plan: 1.  Hyperkalemia  secondary to missed  HD- resolved with serial HD  K 4.6 Counseled about compliance and risks of missing treatments  2.  ESRD -  MWF - HD 5/11 and 5/12 next HD on schedule 5/14 at OP center  3. Abdominal pain N/V - recurrent issues last CT scan in March without acute findings - w/u per primary he reports gnawing abd pain which is worse after eating, ? Consider PUD and utility of GI c/s for scope/improving with meds  4.  Hypertension/volume  - BP improved cont home meds/ Now 1 kg below EDW - lower EDW at discharge 5.  Anemia  - Hgb 10.3 on OP ESA - follow  6.  Metabolic bone disease -  Cont VDRA/ Renvela binder/Sensipar - follow renal panel  7.  Nutrition - Renal diet/vitamins 8. DM - per primary   Subjective:    Feels "10 times" better today Ate breakfast, still some abd pain after eating    Objective:   BP (!) 159/94 (BP Location: Right Arm)   Pulse 71   Temp 98.1 F (36.7 C) (Oral)   Resp 18   Wt 70.2 kg (154 lb 12.8 oz)   SpO2 100%   BMI 19.88 kg/m   Physical Exam: General: WDWN AAM lying on side  Neck: Supple. No JVD No masses Lungs: CTA bilaterally without wheezes, rales, or rhonchi. Breathing is unlabored. Heart: RRR with S1 S2 Abdomen: soft NT + BS Lower extremities:without edema or ischemic changes, no open wounds  Neuro: A & O  X 3. Moves all extremities spontaneously. Dialysis Access: LU AVF +bruit  Labs: BMET  Recent Labs Lab 11/29/16 1253 11/30/16 0520  NA 135 135  K >7.5* 4.6  CL 96* 94*  CO2 23 30  GLUCOSE 80 75  BUN 58* 23*  CREATININE 14.71* 8.41*  CALCIUM 8.7* 8.3*  PHOS  --  5.1*   CBC  Recent Labs Lab  11/29/16 1253 11/30/16 0520  WBC 4.9 4.8  HGB 10.6* 10.0*  HCT 33.8* 31.9*  MCV 84.5 83.9  PLT 199 172    _0 @ Medications:    . amLODipine  10 mg Oral QHS  . calcitRIOL  1.5 mcg Oral Q M,W,F-HD  . Chlorhexidine Gluconate Cloth  6 each Topical Q0600  . cinacalcet  30 mg Oral Q supper  . doxazosin  4 mg Oral QHS  . famotidine  20 mg Oral QHS  . gabapentin  100 mg Oral QHS  . heparin  5,000 Units Subcutaneous Q8H  . metoCLOPramide  5 mg Oral QAC lunch  . multivitamin  1 tablet Oral QHS  . mupirocin ointment  1 application Nasal BID  . pantoprazole  40 mg Oral BID AC  . sevelamer carbonate  800 mg Oral TID WC  . sodium chloride flush  3 mL Intravenous Q12H  . sucralfate  1 g Oral TID WC & HS    Lynnda Child PA-C [place] Pager [phone] 12/01/2016,9:18 AM

## 2016-12-01 NOTE — Progress Notes (Signed)
   Subjective: His appetite is partially improved today, and he ate about 50% of breakfast. His hemodialysis yesterday pulled off fluid to several pounds below his previous dry weight and he thinks this has caused some mild cramping. He is feeling better overall and eager to get home today.  Objective:  Vital signs in last 24 hours: Vitals:   11/30/16 1230 11/30/16 1240 11/30/16 2147 12/01/16 0922  BP: 138/84 (!) 149/87 (!) 159/94 (!) 150/92  Pulse: 68 69 71 72  Resp:  _0 Temp:  97.7 F (36.5 C) 98.1 F (36.7 C) 98 F (36.7 C)  TempSrc:  Oral Oral Oral  SpO2:  98% 100% 100%  Weight:  156 lb 8.4 oz (71 kg) 154 lb 12.8 oz (70.2 kg)    General: In no acute distress. Resting comfortably in bed  Cardiovascular: RRR. No murmur or rub appreciated Pulmonary: CTAB, no crackles, normal WOB Abdomen: Soft, moderate tenderness throughout. +bs Extremities: Warm, well perfused. No peripheral edema Psych: Mood is good with appropriate affect  Assessment/Plan: #Uremia, Hyperkalemia, ESRD on HD Metabolic derangements are resolved after 2 full HD sessions. He can resume his previous outpatient MWF HD schedule after discharge. Dry weight was adjusted yesterday probably due to weight loss from his GI problems and low intake.  #Chronic abdominal pain with associated N/V This is a recurrent problem with dozens of ED visits over the past year. His presentation is challenging to diagnose because it is confounded by uremia and abdominal pain from inadequate dialysis at each visit. I will refer him to outpatient GI clinic for possible EGD or recommendation but has ben difficult to coordinate without a regular PCP.  #GERD Symptoms seem to be improving. Continuing acid suppressing treatment through discharge will be important, will recommend PPI if accessible.  Dispo: Anticipate discharge to home today  Collier Salina, MD PGY-II Internal Medicine Resident Pager# [phone] 12/01/2016, 12:45  PM

## 2016-12-01 NOTE — Progress Notes (Signed)
Patient consistently refusing for several times since this morning to have his telemetry back to him.Paged on call M.D., made aware.

## 2016-12-02 NOTE — Discharge Summary (Signed)
Name: Frank [name] MRN: [id number] DOB: 06-27-1966 51 y.o. PCP: Patient, [name]  Date of Admission: 11/29/2016  2:17 PM Date of Discharge: 12/01/2016 Attending Physician: Axel Filler, MD  Discharge Diagnosis: 1. Hyperkalemia, ESRD on HD 2. Chronic Abdominal pain Principal Problem:   Hyperkalemia Active Problems:   Anemia of chronic kidney failure   DM (diabetes mellitus), type 2, uncontrolled, with renal complications (HCC)   ESRD on hemodialysis (HCC)   Essential hypertension   GERD (gastroesophageal reflux disease)   Uremia   Abdominal pain   Acute on chronic combined systolic and diastolic congestive heart failure [facility])   Discharge Medications: Allergies as of 12/01/2016      Reactions   Butalbital-apap-caffeine Shortness Of Breath, Swelling, Other (See Comments)   Swelling in throat   Ferrlecit [na Ferric Gluc Cplx In Sucrose] Shortness Of Breath, Swelling, Other (See Comments)   Swelling in throat, tolerates Venofor   Minoxidil Shortness Of Breath   Tylenol [acetaminophen] Anaphylaxis, Swelling   Darvocet [propoxyphene N-acetaminophen] Hives      Medication List    STOP taking these medications   famotidine 20 MG tablet Commonly known as:  PEPCID     TAKE these medications   amLODipine 10 MG tablet Commonly known as:  NORVASC Take 1 tablet (10 mg total) by mouth at bedtime. What changed:  medication strength  how much to take   doxazosin 4 MG tablet Commonly known as:  CARDURA Take 4 mg by mouth at bedtime.   FLUoxetine 40 MG capsule Commonly known as:  PROZAC Take 40 mg by mouth every morning.   gabapentin 100 MG capsule Commonly known as:  NEURONTIN Take 100 mg by mouth at bedtime.   metoCLOPramide 5 MG tablet Commonly known as:  REGLAN Take 1 tablet (5 mg total) by mouth every 8 (eight) hours as needed for nausea.   pantoprazole 40 MG tablet Commonly known as:  PROTONIX Take 1 tablet (40 mg total) by mouth 2 (two) times daily  before a meal.   traMADol 50 MG tablet Commonly known as:  ULTRAM Take 1 tablet (50 mg total) by mouth every 12 (twelve) hours as needed for moderate pain or severe pain.       Disposition and follow-up:   Frank [name] was discharged from [facility] in stable condition.  At the hospital follow up visit please address:  1.  ESRD on HD, Hyperkalemia: Ensure compliance with HD sessions and re-emphasize the importance of strict compliance with this for overall health and symptom control (abdominal pain)  Chronic abdominal pain: Interesting presentation. Worsened with uremia. Also worsened with patients PPI non-compliance. Has been referred to GI for follow-up.   2.  Labs / imaging needed at time of follow-up: Renal function panel  3.  Pending labs/ test needing follow-up: none  Hospital Course by problem list: Principal Problem:   Hyperkalemia Active Problems:   Anemia of chronic kidney failure   DM (diabetes mellitus), type 2, uncontrolled, with renal complications (HCC)   ESRD on hemodialysis ([place])   Essential hypertension   GERD (gastroesophageal reflux disease)   Uremia   Abdominal pain   Acute on chronic combined systolic and diastolic congestive heart failure ([place])   1. Hyperkalemia, ESRD on HD with non-compliance 50 y/o M presented 11/29/16 for evaluation of his chronic abdominal pain, nausea and vomiting after HD non-compliance. Last full session 5 days PTA. Has been seen over 21 times and admitted 10 times in  the past 6 months in our hospital system alone for abdominal pain and HD non-compliance. Potassium on admission >7.5 and EKG with peaked t-waves and 1st degree AV block. He was dialyzed x2 with resolution of his hyperkalemia.   2. Chronic abdominal pain, nausea and vomiting. Hx of GERD Chronic complaint with extensive work-up in the past. Abdominal pain improved following HD sessions and he was able to tolerate PO intake at time of discharge. His  weight has remained stable and his albumin is nearly normal. He does have history of gerd and was non-compliant with PPI PTA. Patient would benefit from OUTPATIENT GI referral and EGD for which he was referred.   Discharge Vitals:   BP (!) 150/92 (BP Location: Right Arm)   Pulse 72   Temp 98 F (36.7 C) (Oral)   Resp 18   Wt 154 lb 12.8 oz (70.2 kg)   SpO2 100%   BMI 19.88 kg/m   Pertinent Labs, Studies, and Procedures:  EKG: Sinus brady. 1st degree AV block. Peaked T-waves K: >7.5 on admission, 4.6 at time of d/c.  CXR: No acute cardiopulmonary process KUB: Normal bowel gas pattern  Discharge Instructions: Discharge Instructions    Ambulatory referral to Gastroenterology    Complete by:  As directed    Ongoing episodes of epigastric pain worsened with food, decreased oral intake with weight loss over the past year suspicious for esophagitis/gastritis.   Call MD for:  persistant nausea and vomiting    Complete by:  As directed    Call MD for:  severe uncontrolled pain    Complete by:  As directed    Consult to care management    Complete by:  As directed    Patient is without regular PCP for follow up. He also needs a GI outpatient evaluation for this abdominal pain that has led to more than 30 ED and hospital visits in the past 12 months.   Diet - low sodium heart healthy    Complete by:  As directed    Discharge instructions    Complete by:  As directed    After discharge it is important you continue attending hemodialysis regularly to prevent worsening of your abdominal pain again.  I have prescribed a new antacid medicine Pantoprazole you can take twice daily in place of your Pepcid that will be stronger to improve your stomach function and decrease pain.  I will place a referral to follow up with a gastroenterologist (stomach doctor) to continue working on your symptoms after this hospitalization.   Increase activity slowly    Complete by:  As directed       Signed: Miosha Behe, DO 12/02/2016, 1:55 PM   Pager: [phone]

## 2016-12-04 DIAGNOSIS — N2581 Secondary hyperparathyroidism of renal origin: Secondary | ICD-10-CM | POA: Diagnosis not present

## 2016-12-04 DIAGNOSIS — D631 Anemia in chronic kidney disease: Secondary | ICD-10-CM | POA: Diagnosis not present

## 2016-12-04 DIAGNOSIS — N186 End stage renal disease: Secondary | ICD-10-CM | POA: Diagnosis not present

## 2016-12-06 DIAGNOSIS — D631 Anemia in chronic kidney disease: Secondary | ICD-10-CM | POA: Diagnosis not present

## 2016-12-06 DIAGNOSIS — N2581 Secondary hyperparathyroidism of renal origin: Secondary | ICD-10-CM | POA: Diagnosis not present

## 2016-12-06 DIAGNOSIS — N186 End stage renal disease: Secondary | ICD-10-CM | POA: Diagnosis not present

## 2016-12-11 DIAGNOSIS — D631 Anemia in chronic kidney disease: Secondary | ICD-10-CM | POA: Diagnosis not present

## 2016-12-11 DIAGNOSIS — N2581 Secondary hyperparathyroidism of renal origin: Secondary | ICD-10-CM | POA: Diagnosis not present

## 2016-12-11 DIAGNOSIS — N186 End stage renal disease: Secondary | ICD-10-CM | POA: Diagnosis not present

## 2016-12-13 DIAGNOSIS — N186 End stage renal disease: Secondary | ICD-10-CM | POA: Diagnosis not present

## 2016-12-13 DIAGNOSIS — D631 Anemia in chronic kidney disease: Secondary | ICD-10-CM | POA: Diagnosis not present

## 2016-12-13 DIAGNOSIS — N2581 Secondary hyperparathyroidism of renal origin: Secondary | ICD-10-CM | POA: Diagnosis not present

## 2016-12-18 DIAGNOSIS — D631 Anemia in chronic kidney disease: Secondary | ICD-10-CM | POA: Diagnosis not present

## 2016-12-18 DIAGNOSIS — N2581 Secondary hyperparathyroidism of renal origin: Secondary | ICD-10-CM | POA: Diagnosis not present

## 2016-12-18 DIAGNOSIS — N186 End stage renal disease: Secondary | ICD-10-CM | POA: Diagnosis not present

## 2016-12-19 DIAGNOSIS — N186 End stage renal disease: Secondary | ICD-10-CM | POA: Diagnosis not present

## 2016-12-19 DIAGNOSIS — Z992 Dependence on renal dialysis: Secondary | ICD-10-CM | POA: Diagnosis not present

## 2016-12-19 DIAGNOSIS — T8610 Unspecified complication of kidney transplant: Secondary | ICD-10-CM | POA: Diagnosis not present

## 2016-12-20 DIAGNOSIS — N186 End stage renal disease: Secondary | ICD-10-CM | POA: Diagnosis not present

## 2016-12-20 DIAGNOSIS — N2581 Secondary hyperparathyroidism of renal origin: Secondary | ICD-10-CM | POA: Diagnosis not present

## 2016-12-20 DIAGNOSIS — D631 Anemia in chronic kidney disease: Secondary | ICD-10-CM | POA: Diagnosis not present

## 2016-12-23 DIAGNOSIS — N2581 Secondary hyperparathyroidism of renal origin: Secondary | ICD-10-CM | POA: Diagnosis not present

## 2016-12-23 DIAGNOSIS — N186 End stage renal disease: Secondary | ICD-10-CM | POA: Diagnosis not present

## 2016-12-23 DIAGNOSIS — D631 Anemia in chronic kidney disease: Secondary | ICD-10-CM | POA: Diagnosis not present

## 2016-12-25 DIAGNOSIS — D631 Anemia in chronic kidney disease: Secondary | ICD-10-CM | POA: Diagnosis not present

## 2016-12-25 DIAGNOSIS — N2581 Secondary hyperparathyroidism of renal origin: Secondary | ICD-10-CM | POA: Diagnosis not present

## 2016-12-25 DIAGNOSIS — N186 End stage renal disease: Secondary | ICD-10-CM | POA: Diagnosis not present

## 2016-12-27 DIAGNOSIS — D631 Anemia in chronic kidney disease: Secondary | ICD-10-CM | POA: Diagnosis not present

## 2016-12-27 DIAGNOSIS — N186 End stage renal disease: Secondary | ICD-10-CM | POA: Diagnosis not present

## 2016-12-27 DIAGNOSIS — N2581 Secondary hyperparathyroidism of renal origin: Secondary | ICD-10-CM | POA: Diagnosis not present

## 2016-12-31 DIAGNOSIS — R109 Unspecified abdominal pain: Secondary | ICD-10-CM | POA: Diagnosis not present

## 2016-12-31 DIAGNOSIS — E1122 Type 2 diabetes mellitus with diabetic chronic kidney disease: Secondary | ICD-10-CM | POA: Diagnosis not present

## 2016-12-31 DIAGNOSIS — N3289 Other specified disorders of bladder: Secondary | ICD-10-CM | POA: Diagnosis not present

## 2016-12-31 DIAGNOSIS — N289 Disorder of kidney and ureter, unspecified: Secondary | ICD-10-CM | POA: Diagnosis not present

## 2016-12-31 DIAGNOSIS — Z79899 Other long term (current) drug therapy: Secondary | ICD-10-CM | POA: Diagnosis not present

## 2016-12-31 DIAGNOSIS — N261 Atrophy of kidney (terminal): Secondary | ICD-10-CM | POA: Diagnosis not present

## 2016-12-31 DIAGNOSIS — I120 Hypertensive chronic kidney disease with stage 5 chronic kidney disease or end stage renal disease: Secondary | ICD-10-CM | POA: Diagnosis not present

## 2016-12-31 DIAGNOSIS — Z9115 Patient's noncompliance with renal dialysis: Secondary | ICD-10-CM | POA: Diagnosis not present

## 2016-12-31 DIAGNOSIS — E875 Hyperkalemia: Secondary | ICD-10-CM | POA: Diagnosis not present

## 2016-12-31 DIAGNOSIS — Z940 Kidney transplant status: Secondary | ICD-10-CM | POA: Diagnosis not present

## 2016-12-31 DIAGNOSIS — J810 Acute pulmonary edema: Secondary | ICD-10-CM | POA: Diagnosis not present

## 2016-12-31 DIAGNOSIS — Z992 Dependence on renal dialysis: Secondary | ICD-10-CM | POA: Diagnosis not present

## 2016-12-31 DIAGNOSIS — Z888 Allergy status to other drugs, medicaments and biological substances status: Secondary | ICD-10-CM | POA: Diagnosis not present

## 2016-12-31 DIAGNOSIS — N186 End stage renal disease: Secondary | ICD-10-CM | POA: Diagnosis not present

## 2016-12-31 DIAGNOSIS — X58XXXA Exposure to other specified factors, initial encounter: Secondary | ICD-10-CM | POA: Diagnosis not present

## 2016-12-31 DIAGNOSIS — M545 Low back pain: Secondary | ICD-10-CM | POA: Diagnosis not present

## 2017-01-02 DIAGNOSIS — N2581 Secondary hyperparathyroidism of renal origin: Secondary | ICD-10-CM | POA: Diagnosis not present

## 2017-01-02 DIAGNOSIS — E1122 Type 2 diabetes mellitus with diabetic chronic kidney disease: Secondary | ICD-10-CM | POA: Diagnosis not present

## 2017-01-02 DIAGNOSIS — D631 Anemia in chronic kidney disease: Secondary | ICD-10-CM | POA: Diagnosis not present

## 2017-01-02 DIAGNOSIS — M545 Low back pain: Secondary | ICD-10-CM | POA: Diagnosis not present

## 2017-01-02 DIAGNOSIS — E119 Type 2 diabetes mellitus without complications: Secondary | ICD-10-CM | POA: Diagnosis not present

## 2017-01-02 DIAGNOSIS — Z9109 Other allergy status, other than to drugs and biological substances: Secondary | ICD-10-CM | POA: Diagnosis not present

## 2017-01-02 DIAGNOSIS — Z79899 Other long term (current) drug therapy: Secondary | ICD-10-CM | POA: Diagnosis not present

## 2017-01-02 DIAGNOSIS — I120 Hypertensive chronic kidney disease with stage 5 chronic kidney disease or end stage renal disease: Secondary | ICD-10-CM | POA: Diagnosis not present

## 2017-01-02 DIAGNOSIS — Z91048 Other nonmedicinal substance allergy status: Secondary | ICD-10-CM | POA: Diagnosis not present

## 2017-01-02 DIAGNOSIS — N186 End stage renal disease: Secondary | ICD-10-CM | POA: Diagnosis not present

## 2017-01-02 DIAGNOSIS — Z886 Allergy status to analgesic agent status: Secondary | ICD-10-CM | POA: Diagnosis not present

## 2017-01-02 DIAGNOSIS — R197 Diarrhea, unspecified: Secondary | ICD-10-CM | POA: Diagnosis not present

## 2017-01-02 DIAGNOSIS — Z992 Dependence on renal dialysis: Secondary | ICD-10-CM | POA: Diagnosis not present

## 2017-01-04 ENCOUNTER — Encounter (HOSPITAL_COMMUNITY): Payer: Self-pay | Admitting: Emergency Medicine

## 2017-01-04 ENCOUNTER — Inpatient Hospital Stay (HOSPITAL_COMMUNITY)
Admission: EM | Admit: 2017-01-04 | Discharge: 2017-01-07 | DRG: 391 | Disposition: A | Discharge status: Home / Self Care | Payer: Medicare Other | Attending: Internal Medicine | Admitting: Internal Medicine

## 2017-01-04 DIAGNOSIS — R1030 Lower abdominal pain, unspecified: Secondary | ICD-10-CM | POA: Diagnosis not present

## 2017-01-04 DIAGNOSIS — I428 Other cardiomyopathies: Secondary | ICD-10-CM | POA: Diagnosis not present

## 2017-01-04 DIAGNOSIS — E875 Hyperkalemia: Secondary | ICD-10-CM | POA: Diagnosis present

## 2017-01-04 DIAGNOSIS — Z888 Allergy status to other drugs, medicaments and biological substances status: Secondary | ICD-10-CM

## 2017-01-04 DIAGNOSIS — R001 Bradycardia, unspecified: Secondary | ICD-10-CM | POA: Diagnosis not present

## 2017-01-04 DIAGNOSIS — A084 Viral intestinal infection, unspecified: Secondary | ICD-10-CM | POA: Diagnosis not present

## 2017-01-04 DIAGNOSIS — I5042 Chronic combined systolic (congestive) and diastolic (congestive) heart failure: Secondary | ICD-10-CM | POA: Diagnosis not present

## 2017-01-04 DIAGNOSIS — Z992 Dependence on renal dialysis: Secondary | ICD-10-CM

## 2017-01-04 DIAGNOSIS — M545 Low back pain, unspecified: Secondary | ICD-10-CM | POA: Diagnosis present

## 2017-01-04 DIAGNOSIS — N2581 Secondary hyperparathyroidism of renal origin: Secondary | ICD-10-CM | POA: Diagnosis not present

## 2017-01-04 DIAGNOSIS — Z86718 Personal history of other venous thrombosis and embolism: Secondary | ICD-10-CM

## 2017-01-04 DIAGNOSIS — Z87891 Personal history of nicotine dependence: Secondary | ICD-10-CM

## 2017-01-04 DIAGNOSIS — M898X9 Other specified disorders of bone, unspecified site: Secondary | ICD-10-CM | POA: Diagnosis present

## 2017-01-04 DIAGNOSIS — I132 Hypertensive heart and chronic kidney disease with heart failure and with stage 5 chronic kidney disease, or end stage renal disease: Secondary | ICD-10-CM | POA: Diagnosis present

## 2017-01-04 DIAGNOSIS — T8612 Kidney transplant failure: Secondary | ICD-10-CM | POA: Diagnosis present

## 2017-01-04 DIAGNOSIS — Z9115 Patient's noncompliance with renal dialysis: Secondary | ICD-10-CM

## 2017-01-04 DIAGNOSIS — I10 Essential (primary) hypertension: Secondary | ICD-10-CM | POA: Diagnosis present

## 2017-01-04 DIAGNOSIS — R1013 Epigastric pain: Secondary | ICD-10-CM | POA: Diagnosis present

## 2017-01-04 DIAGNOSIS — D649 Anemia, unspecified: Secondary | ICD-10-CM | POA: Diagnosis present

## 2017-01-04 DIAGNOSIS — Z9119 Patient's noncompliance with other medical treatment and regimen: Secondary | ICD-10-CM

## 2017-01-04 DIAGNOSIS — Z885 Allergy status to narcotic agent status: Secondary | ICD-10-CM

## 2017-01-04 DIAGNOSIS — N186 End stage renal disease: Secondary | ICD-10-CM | POA: Diagnosis not present

## 2017-01-04 DIAGNOSIS — E1159 Type 2 diabetes mellitus with other circulatory complications: Secondary | ICD-10-CM | POA: Diagnosis present

## 2017-01-04 DIAGNOSIS — K219 Gastro-esophageal reflux disease without esophagitis: Secondary | ICD-10-CM | POA: Diagnosis present

## 2017-01-04 DIAGNOSIS — E1122 Type 2 diabetes mellitus with diabetic chronic kidney disease: Secondary | ICD-10-CM | POA: Diagnosis present

## 2017-01-04 DIAGNOSIS — I700 Atherosclerosis of aorta: Secondary | ICD-10-CM | POA: Diagnosis present

## 2017-01-04 LAB — COMPREHENSIVE METABOLIC PANEL
ALBUMIN: 3.5 g/dL (ref 3.5–5.0)
ALT: 8 U/L — AB (ref 17–63)
AST: 19 U/L (ref 15–41)
Alkaline Phosphatase: 164 U/L — ABNORMAL HIGH (ref 38–126)
Anion gap: 15 (ref 5–15)
BILIRUBIN TOTAL: 0.7 mg/dL (ref 0.3–1.2)
BUN: 45 mg/dL — AB (ref 6–20)
CHLORIDE: 93 mmol/L — AB (ref 101–111)
CO2: 28 mmol/L (ref 22–32)
CREATININE: 14.04 mg/dL — AB (ref 0.61–1.24)
Calcium: 7 mg/dL — ABNORMAL LOW (ref 8.9–10.3)
GFR calc Af Amer: 4 mL/min — ABNORMAL LOW (ref >60)
GFR calc non Af Amer: 3 mL/min — ABNORMAL LOW (ref >60)
GLUCOSE: 84 mg/dL (ref 65–99)
POTASSIUM: 5.7 mmol/L — AB (ref 3.5–5.1)
Sodium: 136 mmol/L (ref 135–145)
Total Protein: 7.8 g/dL (ref 6.5–8.1)

## 2017-01-04 LAB — CBC
HEMATOCRIT: 34.8 % — AB (ref 39.0–52.0)
HEMOGLOBIN: 10.8 g/dL — AB (ref 13.0–17.0)
MCH: 26.2 pg (ref 26.0–34.0)
MCHC: 31 g/dL (ref 30.0–36.0)
MCV: 84.3 fL (ref 78.0–100.0)
Platelets: 162 10*3/uL (ref 150–400)
RBC: 4.13 MIL/uL — AB (ref 4.22–5.81)
RDW: 18.4 % — ABNORMAL HIGH (ref 11.5–15.5)
WBC: 3.8 10*3/uL — ABNORMAL LOW (ref 4.0–10.5)

## 2017-01-04 LAB — LIPASE, BLOOD: LIPASE: 25 U/L (ref 11–51)

## 2017-01-04 LAB — CBG MONITORING, ED: Glucose-Capillary: 80 mg/dL (ref 65–99)

## 2017-01-04 MED ORDER — INSULIN ASPART 100 UNIT/ML ~~LOC~~ SOLN
10.0000 [IU] | Freq: Once | SUBCUTANEOUS | Status: AC
  Administered 2017-01-04: 10 [IU] via INTRAVENOUS
  Filled 2017-01-04: qty 1

## 2017-01-04 MED ORDER — HYDROMORPHONE HCL 1 MG/ML IJ SOLN
1.0000 mg | Freq: Once | INTRAMUSCULAR | Status: AC
  Administered 2017-01-04: 1 mg via INTRAVENOUS
  Filled 2017-01-04: qty 1

## 2017-01-04 MED ORDER — SODIUM BICARBONATE 8.4 % IV SOLN
50.0000 meq | Freq: Once | INTRAVENOUS | Status: AC
  Administered 2017-01-04: 50 meq via INTRAVENOUS
  Filled 2017-01-04: qty 50

## 2017-01-04 MED ORDER — DEXTROSE 50 % IV SOLN
1.0000 | Freq: Once | INTRAVENOUS | Status: AC
  Administered 2017-01-04 – 2017-01-05 (×2): 50 mL via INTRAVENOUS
  Filled 2017-01-04: qty 50

## 2017-01-04 MED ORDER — ONDANSETRON 4 MG PO TBDP: 4.0000 mg | ORAL_TABLET | Freq: Once | ORAL | Status: DC

## 2017-01-04 MED ORDER — SODIUM CHLORIDE 0.9 % IV BOLUS (SEPSIS)
1000.0000 mL | Freq: Once | INTRAVENOUS | Status: AC
  Administered 2017-01-04: 1000 mL via INTRAVENOUS

## 2017-01-04 MED ORDER — INSULIN ASPART 100 UNIT/ML IV SOLN: 10.0000 [IU] | Freq: Once | INTRAVENOUS | Status: DC

## 2017-01-04 MED ORDER — ONDANSETRON HCL 4 MG/2ML IJ SOLN
4.0000 mg | Freq: Once | INTRAMUSCULAR | Status: AC
  Administered 2017-01-04: 4 mg via INTRAVENOUS
  Filled 2017-01-04: qty 2

## 2017-01-04 NOTE — ED Notes (Signed)
Spoke with pt regarding refusing blood draw, pt report he will comply and have an IV placed and will allow blood to be drawn. Will place IV team consult.

## 2017-01-04 NOTE — ED Notes (Signed)
Per phlebotomy, only able to draw enough blood for CMP and lipase. Santiago Glad, phlebotomist to come draw pt CBC shortly.

## 2017-01-04 NOTE — ED Provider Notes (Signed)
[place] DEPT Provider Note   CSN: [id number] Arrival date & time: 01/04/17  2047     History   Chief Complaint Chief Complaint  Patient presents with  . Abdominal Pain  . Back Pain    HPI Frank [name] is a 51 y.o. male.  Patient complains of nausea vomiting diarrhea for a couple days. He had his dialysis yesterday but was unable to complete his dialysis   The history is provided by the patient.  Abdominal Pain   This is a recurrent problem. The current episode started more than 2 days ago. The problem occurs constantly. The problem has not changed since onset.The pain is associated with an unknown factor. The pain is located in the generalized abdominal region. The quality of the pain is aching and colicky. The pain is moderate. Associated symptoms include diarrhea and vomiting. Pertinent negatives include frequency, hematuria and headaches. Nothing aggravates the symptoms. Nothing relieves the symptoms.  Back Pain   Associated symptoms include abdominal pain. Pertinent negatives include no chest pain and no headaches.    Past Medical History:  Diagnosis Date  . Anemia   . Anxiety   . Chronic combined systolic and diastolic CHF (congestive heart failure) (HCC)    a. EF 20-25% by echo in 08/2015 b. echo 10/2015: EF 35-40%, diffuse HK, severe LAE, moderate RAE, small pericardial effusion  . Complication of anesthesia    itching, sore throat  . Depression   . Dialysis patient ([place])   . ESRD (end stage renal disease) ([place])    due to HTN per patient, followed at [facility], s/p failed kidney transplant - dialysis Tue, Th, Sat  . Hyperkalemia 12/2015  . Hypertension   . Junctional rhythm    a. noted in 08/2015: hyperkalemic at that time  b. 12/2015: presented in junctional rhythm w/ K+ of 6.6. Resolved with improvement of K+ levels.  . Nonischemic cardiomyopathy ([place])    a. 08/2014: cath showing minimal CAD, but tortuous arteries noted.   . Personal history of DVT (deep  vein thrombosis)/ PE 05/26/2016   In Oct 2015 had small subsemental LUL PE w/o DVT (LE dopplers neg) and was felt to be HD cath related, treated w coumadin.  IN May 2016 had small vein DVT (acute/subacute) in the R basilic/ brachial veins of the RUE, resumed on coumadin.  Had R sided HD cath at that time.    . Renal insufficiency   . Shortness of breath   . Type II diabetes mellitus (HCC)    No history per patient, but remains under history as A1c would not be accurate given on dialysis    Patient Active Problem List   Diagnosis Date Noted  . Abdominal pain 08/04/2016  . Uremia 06/07/2016  . Hyperkalemia 05/29/2016  . GERD (gastroesophageal reflux disease) 05/29/2016  . Personal history of DVT (deep vein thrombosis)/ PE 05/26/2016  . Nonischemic cardiomyopathy ([place]) 01/09/2016  . Bilateral low back pain without sciatica   . Renal cyst, left 10/30/2015  . Constipation by delayed colonic transit 10/30/2015  . Acute on chronic combined systolic and diastolic congestive heart failure ([place]) 09/23/2015  . Adjustment disorder with mixed anxiety and depressed mood 08/20/2015  . Essential hypertension 01/02/2015  . Dyslipidemia   . Malignant hypertension 11/29/2014  . ESRD on hemodialysis ([place])   . DM (diabetes mellitus), type 2, uncontrolled, with renal complications ([place])   . Complex sleep apnea syndrome 05/05/2014  . Anemia of chronic kidney failure 06/24/2013    Past  Surgical History:  Procedure Laterality Date  . CAPD INSERTION    . CAPD REMOVAL    . INGUINAL HERNIA REPAIR Right 02/14/2015   Procedure: REPAIR INCARCERATED RIGHT INGUINAL HERNIA;  Surgeon: [name], MD;  Location: [place];  Service: General;  Laterality: Right;  . INSERTION OF DIALYSIS CATHETER Right 09/23/2015   Procedure: exchange of Right internal Dialysis Catheter.;  Surgeon: [name], MD;  Location: [place];  Service: Vascular;  Laterality: Right;  . IR GENERIC HISTORICAL  07/16/2016   IR US GUIDE VASC ACCESS  LEFT 07/16/2016 Corrie Mckusick, DO [facility]-INTERV RAD  . IR GENERIC HISTORICAL Left 07/16/2016   IR THROMBECTOMY AV FISTULA W/THROMBOLYSIS/PTA INC/SHUNT/IMG LEFT 07/16/2016 Corrie Mckusick, DO [facility]-INTERV RAD  . KIDNEY RECEIPIENT  2006   failed and started HD in March 2014  . LEFT HEART CATHETERIZATION WITH CORONARY ANGIOGRAM N/A 09/02/2014   Procedure: LEFT HEART CATHETERIZATION WITH CORONARY ANGIOGRAM;  Surgeon: [name], MD;  Location: [facility] CATH LAB;  Service: Cardiovascular;  Laterality: N/A;       Home Medications    Prior to Admission medications   Medication Sig Start Date End Date Taking? Authorizing Provider  amLODipine (NORVASC) 10 MG tablet Take 1 tablet (10 mg total) by mouth at bedtime. 12/01/16   Rice, Resa Miner, MD  doxazosin (CARDURA) 4 MG tablet Take 4 mg by mouth at bedtime. 11/26/16   [provider]  FLUoxetine (PROZAC) 40 MG capsule Take 40 mg by mouth every morning. 11/26/16   [provider]  gabapentin (NEURONTIN) 100 MG capsule Take 100 mg by mouth at bedtime. 11/26/16   [provider]  metoCLOPramide (REGLAN) 5 MG tablet Take 1 tablet (5 mg total) by mouth every 8 (eight) hours as needed for nausea. 12/01/16   Rice, Resa Miner, MD  pantoprazole (PROTONIX) 40 MG tablet Take 1 tablet (40 mg total) by mouth 2 (two) times daily before a meal. 12/01/16   Rice, Resa Miner, MD  traMADol (ULTRAM) 50 MG tablet Take 1 tablet (50 mg total) by mouth every 12 (twelve) hours as needed for moderate pain or severe pain. 12/01/16   Collier Salina, MD    Family History Family History  Problem Relation Age of Onset  . Hypertension Other     Social History Social History  Substance Use Topics  . Smoking status: Former Smoker    Packs/day: 0.00    Years: 1.00    Types: Cigarettes  . Smokeless tobacco: Never Used     Comment: quit Jan 2014  . Alcohol use No     Allergies   Butalbital-apap-caffeine; Ferrlecit [na ferric gluc cplx in sucrose];  Minoxidil; Tylenol [acetaminophen]; and Darvocet [propoxyphene n-acetaminophen]   Review of Systems Review of Systems  Constitutional: Negative for appetite change and fatigue.  HENT: Negative for congestion, ear discharge and sinus pressure.   Eyes: Negative for discharge.  Respiratory: Negative for cough.   Cardiovascular: Negative for chest pain.  Gastrointestinal: Positive for abdominal pain, diarrhea and vomiting.  Genitourinary: Negative for frequency and hematuria.  Musculoskeletal: Positive for back pain.  Skin: Negative for rash.  Neurological: Negative for seizures and headaches.  Psychiatric/Behavioral: Negative for hallucinations.     Physical Exam Updated Vital Signs BP (!) 154/109   Pulse 63   Temp 97.5 F (36.4 C) (Oral)   Resp 16   SpO2 98%   Physical Exam  Constitutional: He is oriented to person, place, and time. He appears well-developed.  HENT:  Head:  Normocephalic.  Eyes: Conjunctivae and EOM are normal. No scleral icterus.  Neck: Neck supple. No thyromegaly present.  Cardiovascular: Normal rate and regular rhythm.  Exam reveals no gallop and no friction rub.   No murmur heard. Pulmonary/Chest: No stridor. He has no wheezes. He has no rales. He exhibits no tenderness.  Abdominal: He exhibits no distension. There is tenderness. There is no rebound.  Musculoskeletal: Normal range of motion. He exhibits no edema.  Lymphadenopathy:    He has no cervical adenopathy.  Neurological: He is oriented to person, place, and time. He exhibits normal muscle tone. Coordination normal.  Skin: No rash noted. No erythema.  Psychiatric: He has a normal mood and affect. His behavior is normal.     ED Treatments / Results  Labs (all labs ordered are listed, but only abnormal results are displayed) Labs Reviewed  COMPREHENSIVE METABOLIC PANEL - Abnormal; Notable for the following:       Result Value   Potassium 5.7 (*)    Chloride 93 (*)    BUN 45 (*)     Creatinine, Ser 14.04 (*)    Calcium 7.0 (*)    ALT 8 (*)    Alkaline Phosphatase 164 (*)    GFR calc non Af Amer 3 (*)    GFR calc Af Amer 4 (*)    All other components within normal limits  CBC - Abnormal; Notable for the following:    WBC 3.8 (*)    RBC 4.13 (*)    Hemoglobin 10.8 (*)    HCT 34.8 (*)    RDW 18.4 (*)    All other components within normal limits  LIPASE, BLOOD    EKG  EKG Interpretation None       Radiology No results found.  Procedures Procedures (including critical care time)  Medications Ordered in ED Medications  ondansetron (ZOFRAN-ODT) disintegrating tablet 4 mg (not administered)  dextrose 50 % solution 50 mL (not administered)  sodium bicarbonate injection 50 mEq (not administered)  insulin aspart (novoLOG) injection 10 Units (not administered)  sodium chloride 0.9 % bolus 1,000 mL (1,000 mLs Intravenous New Bag/Given 01/04/17 2223)  ondansetron (ZOFRAN) injection 4 mg (4 mg Intravenous Given 01/04/17 2223)  HYDROmorphone (DILAUDID) injection 1 mg (1 mg Intravenous Given 01/04/17 2223)     Initial Impression / Assessment and Plan / ED Course  I have reviewed the triage vital signs and the nursing notes.  Pertinent labs & imaging results that were available during my care of the patient were reviewed by me and considered in my medical decision making (see chart for details).    CRITICAL CARE Performed by: Keagen Heinlen L Total critical care time:61mnutes Critical care time was exclusive of separately billable procedures and treating other patients. Critical care was necessary to treat or prevent imminent or life-threatening deterioration. Critical care was time spent personally by me on the following activities: development of treatment plan with patient and/or surrogate as well as nursing, discussions with consultants, evaluation of patient's response to treatment, examination of patient, obtaining history from patient or surrogate, ordering  and performing treatments and interventions, ordering and review of laboratory studies, ordering and review of radiographic studies, pulse oximetry and re-evaluation of patient's condition.   Patient with gastroenteritis and hyperkalemia. He will be admitted to medicine and will get a CT scan of the abdomen  Final Clinical Impressions(s) / ED Diagnoses   Final diagnoses:  Lower abdominal pain    New Prescriptions New Prescriptions   No  medications on file  Patient   Milton Ferguson, MD 01/04/17 2336

## 2017-01-04 NOTE — ED Notes (Signed)
IV team at bedside.

## 2017-01-04 NOTE — ED Notes (Signed)
Pt presents to ED for assessment of abdominal pain, back pain, and n/v/d x 2 days.  Hx of same.  States he has vomited 5+ times today, denies diarrhea in the past 24 hours.

## 2017-01-04 NOTE — ED Notes (Signed)
Pt reports his last dialysis was yesterday, pt only able to tolerate 3 of 4 hours due to nausea and pain.

## 2017-01-04 NOTE — ED Notes (Signed)
Per CT, pt to be transported to CT scan in approx 30-45 minutes.

## 2017-01-04 NOTE — H&P (Signed)
Date: 01/04/2017         Patient Name:  Frank [name] MRN: [id number]  DOB: 1966-06-06 Age / Sex: 51 y.o., male   PCP: Patient, [name]         Medical Service: Internal Medicine Teaching Service         Attending Physician: Dr. Johnnette Gourd    First Contact: Dr. Ledell Noss Pager: [phone]  Second Contact: Dr. Jule Ser Pager: [phone]       After Hours (After 5p /  First Contact Pager: [phone]  Weekends / Holidays): Second Contact Pager: [phone]   Chief Complaint: N/V, hyperkalemia  History of Present Illness: [name] is a 51 y.o. male with a h/o of ESRD on HD, CHF, HTN, who presents with N/V/Abd pain and missed HD/hyperkalemia.  Patient presented with 2 day history of nausea, vomiting, periumbilical and epigastric abdominal pain. Patient reports that this began during dialysis on Thursday and he signed off review his HD. He had multiple episodes of vomiting daily since that time unable to keep down any solid food. He is able to keep down water and occasionally his medications but has had inconsistent compliance with medications due to his emesis. He has been consistently nauseous and his abdominal pain has been constant since that time. The abdominal pain is not affected by eating or drinking. Patient reports that his last bowel movement was 2 days ago which she had to strain. He does report though he had 2 small episodes of bowel incontinence yesterday and the day prior. Patient denies any fevers, chills, sweats. Patient denies any sick contacts. Denies any known exposures such as new foods or eating anything different than his usual. Patient denies noncompliance with hemodialysis prior to Thursday, but was unable to attend his HD session as usual today.  Patient endorses one prior episodes of significant abdominal pain 3 years ago for which she was treated symptomatically but not started on chronic therapy. Patient does not recall the diagnosis at that time or any  medication use. Patient does have a history of Reglan and Protonix use daily for reflux and "his stomach". Patient denies any chest pain, shortness of breath, urinary symptoms, blood in the stool, dark black stool, polyuria, polydipsia, rash.  In the ED, CBC was w/o leukocytosis and showed stable anemia of CKD. Pt was hyperkalemic and mildly uremic, but not found to have EKG changes other than mild bradycardia to HR 58 bpm. HDS. He was given dextrose + insulin. CT was ordered for abd pain evaluation and pt was admitted to IMTS for HD and further w/u.  Meds: Current Outpatient Prescriptions  Medication Sig Dispense Refill  . amLODipine (NORVASC) 10 MG tablet Take 1 tablet (10 mg total) by mouth at bedtime. 30 tablet 2  . doxazosin (CARDURA) 4 MG tablet Take 4 mg by mouth at bedtime.  3  . FLUoxetine (PROZAC) 40 MG capsule Take 40 mg by mouth every morning.  0  . gabapentin (NEURONTIN) 100 MG capsule Take 100 mg by mouth at bedtime.  3  . metoCLOPramide (REGLAN) 5 MG tablet Take 1 tablet (5 mg total) by mouth every 8 (eight) hours as needed for nausea. 60 tablet 1  . pantoprazole (PROTONIX) 40 MG tablet Take 1 tablet (40 mg total) by mouth 2 (two) times daily before a meal. 60 tablet 2  . traMADol (ULTRAM) 50 MG tablet Take 1 tablet (50 mg total) by mouth every 12 (twelve) hours as needed for  moderate pain or severe pain. 10 tablet 0   Allergies: Allergies as of 01/04/2017 - Review Complete 01/04/2017  Allergen Reaction Noted  . Butalbital-apap-caffeine Shortness Of Breath, Swelling, and Other (See Comments) 08/18/2015  . Ferrlecit [na ferric gluc cplx in sucrose] Shortness Of Breath, Swelling, and Other (See Comments) 06/24/2013  . Minoxidil Shortness Of Breath 08/18/2015  . Tylenol [acetaminophen] Anaphylaxis and Swelling 06/25/2016  . Darvocet [propoxyphene n-acetaminophen] Hives 10/16/2011   Past Medical History:  Diagnosis Date  . Anemia   . Anxiety   . Chronic combined systolic and  diastolic CHF (congestive heart failure) (HCC)    a. EF 20-25% by echo in 08/2015 b. echo 10/2015: EF 35-40%, diffuse HK, severe LAE, moderate RAE, small pericardial effusion  . Complication of anesthesia    itching, sore throat  . Depression   . Dialysis patient ([place])   . ESRD (end stage renal disease) ([place])    due to HTN per patient, followed at [facility], s/p failed kidney transplant - dialysis Tue, Th, Sat  . Hyperkalemia 12/2015  . Hypertension   . Junctional rhythm    a. noted in 08/2015: hyperkalemic at that time  b. 12/2015: presented in junctional rhythm w/ K+ of 6.6. Resolved with improvement of K+ levels.  . Nonischemic cardiomyopathy ([place])    a. 08/2014: cath showing minimal CAD, but tortuous arteries noted.   . Personal history of DVT (deep vein thrombosis)/ PE 05/26/2016   In Oct 2015 had small subsemental LUL PE w/o DVT (LE dopplers neg) and was felt to be HD cath related, treated w coumadin.  IN May 2016 had small vein DVT (acute/subacute) in the R basilic/ brachial veins of the RUE, resumed on coumadin.  Had R sided HD cath at that time.    . Renal insufficiency   . Shortness of breath   . Type II diabetes mellitus (HCC)    No history per patient, but remains under history as A1c would not be accurate given on dialysis   Family History: Pt family history includes Hypertension in his other.  Social History: Pt  reports that he has quit smoking. His smoking use included Cigarettes. He smoked 0.00 packs per day for 1.00 year. He has never used smokeless tobacco. He reports that he does not drink alcohol or use drugs.  Review of Systems: ROS per HPI and as below. Review of Systems  Constitutional: Negative for chills, fever and weight loss.  Eyes: Negative for blurred vision.  Respiratory: Negative for cough and shortness of breath.   Cardiovascular: Negative for chest pain and leg swelling.  Gastrointestinal: Positive for abdominal pain, constipation, diarrhea, nausea and  vomiting.  Genitourinary: Negative for dysuria, frequency and urgency.  Musculoskeletal: Positive for back pain. Negative for myalgias.  Skin: Negative for rash.  Neurological: Negative for dizziness, tremors and headaches.  Endo/Heme/Allergies: Negative for polydipsia.  Psychiatric/Behavioral: The patient is not nervous/anxious.    Physical Exam: Vitals:   01/04/17 2051 01/04/17 2112 01/04/17 2130 01/04/17 2200  BP: (!) 160/107 (!) 163/110 (!) 157/106 (!) 154/109  Pulse: (!) 56 (!) 58 62 63  Resp: _0 Temp: 97.5 F (36.4 C)     TempSrc: Oral     SpO2: 100% 99% 96% 98%   Physical Exam  Constitutional: He is oriented to person, place, and time. He appears well-developed and well-nourished. He is cooperative. No distress.  HENT:  Head: Normocephalic and atraumatic.  Right Ear: Hearing normal.  Left Ear: Hearing normal.  Nose: Nose normal.  Mouth/Throat: Mucous membranes are normal.  Cardiovascular: Normal rate, regular rhythm, S1 normal, S2 normal and intact distal pulses.  Exam reveals no gallop.   No murmur heard. Pulmonary/Chest: Effort normal and breath sounds normal. No respiratory distress. He has no wheezes. He has no rhonchi. He has no rales. He exhibits no tenderness.  Abdominal: Soft. Normal appearance and bowel sounds are normal. He exhibits no ascites. There is no hepatosplenomegaly. There is generalized tenderness and tenderness in the epigastric area and periumbilical area. There is guarding (voluntary).  Musculoskeletal: He exhibits no edema.  Neurological: He is alert and oriented to person, place, and time. He has normal strength.  Skin: Skin is warm, dry and intact. He is not diaphoretic.  Psychiatric: He has a normal mood and affect. His speech is normal and behavior is normal.   Labs: CBC:  Recent Labs Lab 01/04/17 2206  WBC 3.8*  HGB 10.8*  HCT 34.8*  MCV 84.3  PLT 784   Basic Metabolic Panel:  Recent Labs Lab 01/04/17 2058  NA 136  K  5.7*  CL 93*  CO2 28  GLUCOSE 84  BUN 45*  CREATININE 14.04*  CALCIUM 7.0*   Liver Function Tests:  Recent Labs Lab 01/04/17 2058  AST 19  ALT 8*  ALKPHOS 164*  BILITOT 0.7  PROT 7.8  ALBUMIN 3.5    Recent Labs Lab 01/04/17 2058  LIPASE 25   CBG: Lab Results  Component Value Date   HGBA1C 4.6 (L) 10/05/2016   ECHO COMPLETE   Collection Time: 10/31/15  4:26 PM    Study Conclusions: - Left ventricle: The cavity size was normal. There was severe concentric hypertrophy. Systolic function was moderately reduced. The estimated ejection fraction was in the range of 35% to 40%. Abnormal GLPSS at -11% with inferoseptal strain abnormality. Diffuse hypokinesis. The study is not technically sufficient to allow evaluation of LV diastolic function. - Mitral valve: Mildly thickened leaflets . There was mild regurgitation. - Left atrium: Severely dilated at 63 ml/m2. - Right ventricle: RVH. The cavity size was moderately dilated. Reduced systolic function. - Right atrium: Moderately dilated at 24 cm2. - Tricuspid valve: There was moderate regurgitation. - Pulmonary arteries: Dilated. PA peak pressure: 52 mm Hg (S). - Inferior vena cava: The vessel was dilated. The respirophasic diameter changes were blunted (< 50%), consistent with elevated central venous pressure. - Pericardium, extracardiac: Small pericardial effusion. Cannot exclude tamponade physiology. Clinical correlation is advised.  Impressions: - Compared to a prior study in 08/2015, the EF has improved to 35-40%, global hypokinesis, severe LAE, moderate RAE, moderately dilated and hypertrophied RV with reduced systolic function, moderate TR, RVSP 52 mmHg, persistent small pericardial effusion.   Assessment & Plan by Problem: Active Problems:   ESRD on hemodialysis [facility])   Essential hypertension   Hyperkalemia   Abdominal pain  Mr. AUDY DAUPHINE is a 51 y.o. male with ESRD on HD, CHF and HTN who presents with N/V and  abdominal pain and missed HD with hyperkalemia.  1) N/V/Abd pain:  Suspect GI pathology, possibly viral enteritis vs gastritis vs constipation. Also possible for GB disease, though semiology is not consistent. No h/o alcohol use or pancreatitis w/ normal lipase. Diverticulitis possible, though would likely be transverse d/t location of pain. No s/s of acute abdomen. Agree with CT as ordered by ED. Labs wnl w/o leukocytosis, afebrile and HDS w/o s/s of systemic infection. - admit to tele - CT A/P w and w/o -  f/u CBC and BMP tomorrow - GI cocktail (w/o Donataal 2/2 allergy) - IV Protonix BID - IV Reglan TID - IV Dilaudid 40m q2h - NPO for now - would start maintenance fluids tomorrow if unable to take PO  2) Hyperkalemia: 2/2 missed HD. S/p insulin + dextrose in the ED. No EKG changes, mild bradycardia. Trend tomorrow. HD tomorrow for definitive treatment. - HD tomorrow - BMP in AM  3) ESRD on HD (T,Th,S): H/o non-compliance. Unclear if this results in recurrent N/V/Abd pain or if recurrent symptoms prevent HD compliance. Pt did not receive full session of HD today. Hyperkalemic as above, other electrolytes unremarkable. No EKG changes or other indications for emergent HD. Will plan HD per Nephrology tomorrow. - HD per Nephro  DVT PPx - heparin  Code Status - Full  Consults Placed - Nephrology (call in AM)  Dispo: Admit patient to Observation with expected length of stay less than 2 midnights.  Signed: SHolley Raring MD 01/04/2017, 11:42 PM  Pager: [phone]

## 2017-01-04 NOTE — ED Notes (Signed)
Pt does not create urine.  Will d/c urinalysis

## 2017-01-04 NOTE — ED Notes (Signed)
Pt refuse blood draw.  Notified nurse

## 2017-01-05 ENCOUNTER — Other Ambulatory Visit: Payer: Self-pay

## 2017-01-05 ENCOUNTER — Observation Stay (HOSPITAL_COMMUNITY): Payer: Medicare Other

## 2017-01-05 DIAGNOSIS — R112 Nausea with vomiting, unspecified: Secondary | ICD-10-CM | POA: Diagnosis not present

## 2017-01-05 DIAGNOSIS — R1013 Epigastric pain: Secondary | ICD-10-CM

## 2017-01-05 DIAGNOSIS — I700 Atherosclerosis of aorta: Secondary | ICD-10-CM

## 2017-01-05 DIAGNOSIS — R109 Unspecified abdominal pain: Secondary | ICD-10-CM | POA: Diagnosis not present

## 2017-01-05 HISTORY — DX: Atherosclerosis of aorta: I70.0

## 2017-01-05 LAB — BASIC METABOLIC PANEL
Anion gap: 15 (ref 5–15)
Anion gap: 15 (ref 5–15)
BUN: 48 mg/dL — ABNORMAL HIGH (ref 6–20)
BUN: 52 mg/dL — AB (ref 6–20)
CALCIUM: 6.7 mg/dL — AB (ref 8.9–10.3)
CHLORIDE: 96 mmol/L — AB (ref 101–111)
CO2: 28 mmol/L (ref 22–32)
CO2: 23 mmol/L (ref 22–32)
Calcium: 6.5 mg/dL — ABNORMAL LOW (ref 8.9–10.3)
Chloride: 97 mmol/L — ABNORMAL LOW (ref 101–111)
Creatinine, Ser: 13.88 mg/dL — ABNORMAL HIGH (ref 0.61–1.24)
Creatinine, Ser: 13.96 mg/dL — ABNORMAL HIGH (ref 0.61–1.24)
GFR calc Af Amer: 4 mL/min — ABNORMAL LOW (ref >60)
GFR calc Af Amer: 4 mL/min — ABNORMAL LOW (ref >60)
GFR calc non Af Amer: 4 mL/min — ABNORMAL LOW (ref >60)
GFR calc non Af Amer: 3 mL/min — ABNORMAL LOW (ref >60)
GLUCOSE: 58 mg/dL — AB (ref 65–99)
GLUCOSE: 122 mg/dL — AB (ref 65–99)
POTASSIUM: 6.1 mmol/L — AB (ref 3.5–5.1)
Potassium: 4.4 mmol/L (ref 3.5–5.1)
Sodium: 140 mmol/L (ref 135–145)
Sodium: 134 mmol/L — ABNORMAL LOW (ref 135–145)

## 2017-01-05 LAB — CBC
HCT: 33 % — ABNORMAL LOW (ref 39.0–52.0)
Hemoglobin: 10.2 g/dL — ABNORMAL LOW (ref 13.0–17.0)
MCH: 26 pg (ref 26.0–34.0)
MCHC: 30.9 g/dL (ref 30.0–36.0)
MCV: 84.2 fL (ref 78.0–100.0)
PLATELETS: 141 10*3/uL — AB (ref 150–400)
RBC: 3.92 MIL/uL — ABNORMAL LOW (ref 4.22–5.81)
RDW: 18.3 % — ABNORMAL HIGH (ref 11.5–15.5)
WBC: 4.5 10*3/uL (ref 4.0–10.5)

## 2017-01-05 LAB — CBG MONITORING, ED
GLUCOSE-CAPILLARY: 28 mg/dL — AB (ref 65–99)
Glucose-Capillary: 112 mg/dL — ABNORMAL HIGH (ref 65–99)

## 2017-01-05 LAB — GLUCOSE, CAPILLARY
GLUCOSE-CAPILLARY: 73 mg/dL (ref 65–99)
GLUCOSE-CAPILLARY: 87 mg/dL (ref 65–99)
GLUCOSE-CAPILLARY: 146 mg/dL — AB (ref 65–99)
Glucose-Capillary: 72 mg/dL (ref 65–99)
Glucose-Capillary: 85 mg/dL (ref 65–99)
Glucose-Capillary: 128 mg/dL — ABNORMAL HIGH (ref 65–99)

## 2017-01-05 LAB — MAGNESIUM: Magnesium: 1.7 mg/dL (ref 1.7–2.4)

## 2017-01-05 LAB — TROPONIN I: Troponin I: <0.03 ng/mL (ref <0.03)

## 2017-01-05 MED ORDER — PANTOPRAZOLE SODIUM 40 MG IV SOLR
40.0000 mg | Freq: Two times a day (BID) | INTRAVENOUS | Status: DC
  Administered 2017-01-05: 40 mg via INTRAVENOUS
  Filled 2017-01-05: qty 40

## 2017-01-05 MED ORDER — ONDANSETRON HCL 4 MG/2ML IJ SOLN
INTRAMUSCULAR | Status: AC
  Administered 2017-01-05: 22:00:00
  Filled 2017-01-05: qty 2

## 2017-01-05 MED ORDER — SODIUM POLYSTYRENE SULFONATE 15 GM/60ML PO SUSP
15.0000 g | Freq: Once | ORAL | Status: AC
  Administered 2017-01-05: 15 g via ORAL
  Filled 2017-01-05 (×2): qty 60

## 2017-01-05 MED ORDER — IOPAMIDOL (ISOVUE-300) INJECTION 61%
INTRAVENOUS | Status: AC
  Administered 2017-01-05: 100 mL via INTRAVENOUS
  Filled 2017-01-05: qty 100

## 2017-01-05 MED ORDER — METOCLOPRAMIDE HCL 5 MG/ML IJ SOLN
5.0000 mg | Freq: Three times a day (TID) | INTRAMUSCULAR | Status: DC
  Administered 2017-01-05: 5 mg via INTRAVENOUS
  Filled 2017-01-05: qty 2

## 2017-01-05 MED ORDER — GABAPENTIN 100 MG PO CAPS
100.0000 mg | ORAL_CAPSULE | Freq: Three times a day (TID) | ORAL | Status: DC
  Administered 2017-01-06 – 2017-01-07 (×3): 100 mg via ORAL
  Filled 2017-01-05 (×3): qty 1

## 2017-01-05 MED ORDER — HYDROMORPHONE HCL 1 MG/ML IJ SOLN
1.0000 mg | Freq: Once | INTRAMUSCULAR | Status: AC | PRN
  Administered 2017-01-05: 1 mg via INTRAVENOUS
  Filled 2017-01-05: qty 1

## 2017-01-05 MED ORDER — CINACALCET HCL 30 MG PO TABS
90.0000 mg | ORAL_TABLET | Freq: Every day | ORAL | Status: DC
  Administered 2017-01-05: 90 mg via ORAL
  Filled 2017-01-05: qty 3

## 2017-01-05 MED ORDER — CARVEDILOL 12.5 MG PO TABS
25.0000 mg | ORAL_TABLET | Freq: Two times a day (BID) | ORAL | Status: DC
  Administered 2017-01-05 (×2): 25 mg via ORAL
  Filled 2017-01-05 (×2): qty 2

## 2017-01-05 MED ORDER — METOCLOPRAMIDE HCL 5 MG PO TABS
5.0000 mg | ORAL_TABLET | Freq: Three times a day (TID) | ORAL | Status: DC | PRN
  Filled 2017-01-05: qty 1

## 2017-01-05 MED ORDER — DEXTROSE 50 % IV SOLN
INTRAVENOUS | Status: AC
  Administered 2017-01-05: 50 mL via INTRAVENOUS
  Filled 2017-01-05: qty 50

## 2017-01-05 MED ORDER — DEXTROSE 50 % IV SOLN: 1.0000 | Freq: Once | INTRAVENOUS | Status: DC

## 2017-01-05 MED ORDER — GLUCAGON HCL RDNA (DIAGNOSTIC) 1 MG IJ SOLR
INTRAMUSCULAR | Status: AC
  Administered 2017-01-05: 22:00:00
  Filled 2017-01-05: qty 1

## 2017-01-05 MED ORDER — SEVELAMER CARBONATE 800 MG PO TABS
2400.0000 mg | ORAL_TABLET | Freq: Three times a day (TID) | ORAL | Status: DC
  Administered 2017-01-05 – 2017-01-07 (×4): 2400 mg via ORAL
  Filled 2017-01-05 (×5): qty 3

## 2017-01-05 MED ORDER — SODIUM CHLORIDE 0.9% FLUSH
3.0000 mL | Freq: Two times a day (BID) | INTRAVENOUS | Status: DC
  Administered 2017-01-05 – 2017-01-06 (×4): 3 mL via INTRAVENOUS

## 2017-01-05 MED ORDER — LIDOCAINE VISCOUS 2 % MT SOLN
15.0000 mL | Freq: Once | OROMUCOSAL | Status: DC
  Filled 2017-01-05: qty 15

## 2017-01-05 MED ORDER — SEVELAMER CARBONATE 800 MG PO TABS
1600.0000 mg | ORAL_TABLET | Freq: Every day | ORAL | Status: DC
  Filled 2017-01-05: qty 2

## 2017-01-05 MED ORDER — HYDROMORPHONE HCL 2 MG PO TABS
4.0000 mg | ORAL_TABLET | ORAL | Status: DC | PRN
  Administered 2017-01-05 – 2017-01-06 (×5): 4 mg via ORAL
  Filled 2017-01-05 (×6): qty 2

## 2017-01-05 MED ORDER — FLUOXETINE HCL 20 MG PO CAPS
40.0000 mg | ORAL_CAPSULE | Freq: Every morning | ORAL | Status: DC
  Administered 2017-01-06 – 2017-01-07 (×2): 40 mg via ORAL
  Filled 2017-01-05 (×2): qty 2

## 2017-01-05 MED ORDER — PANTOPRAZOLE SODIUM 40 MG PO TBEC
40.0000 mg | DELAYED_RELEASE_TABLET | Freq: Two times a day (BID) | ORAL | Status: DC
  Administered 2017-01-05 – 2017-01-07 (×3): 40 mg via ORAL
  Filled 2017-01-05 (×3): qty 1

## 2017-01-05 MED ORDER — HYDROMORPHONE HCL 1 MG/ML IJ SOLN: 1.0000 mg | INTRAMUSCULAR | Status: DC | PRN

## 2017-01-05 MED ORDER — CYCLOBENZAPRINE HCL 10 MG PO TABS
10.0000 mg | ORAL_TABLET | Freq: Three times a day (TID) | ORAL | Status: DC | PRN
  Administered 2017-01-07: 10 mg via ORAL
  Filled 2017-01-05: qty 1

## 2017-01-05 MED ORDER — HEPARIN SODIUM (PORCINE) 5000 UNIT/ML IJ SOLN
5000.0000 [IU] | Freq: Three times a day (TID) | INTRAMUSCULAR | Status: DC
  Filled 2017-01-05 (×3): qty 1

## 2017-01-05 MED ORDER — ONDANSETRON HCL 4 MG/2ML IJ SOLN
4.0000 mg | Freq: Once | INTRAMUSCULAR | Status: AC
  Administered 2017-01-05: 4 mg via INTRAVENOUS

## 2017-01-05 MED ORDER — ALUM & MAG HYDROXIDE-SIMETH 200-200-20 MG/5ML PO SUSP
15.0000 mL | Freq: Once | ORAL | Status: AC
  Administered 2017-01-05: 15 mL via ORAL
  Filled 2017-01-05: qty 30

## 2017-01-05 MED ORDER — DOXAZOSIN MESYLATE 4 MG PO TABS: 4.0000 mg | ORAL_TABLET | Freq: Every day | ORAL | Status: DC

## 2017-01-05 MED ORDER — DOXAZOSIN MESYLATE 4 MG PO TABS
4.0000 mg | ORAL_TABLET | Freq: Every day | ORAL | Status: DC
Start: 2017-01-05 — End: 2017-01-07
  Administered 2017-01-05 – 2017-01-06 (×2): 4 mg via ORAL
  Filled 2017-01-05 (×2): qty 1

## 2017-01-05 MED ORDER — AMLODIPINE BESYLATE 10 MG PO TABS
10.0000 mg | ORAL_TABLET | Freq: Every day | ORAL | Status: DC
  Administered 2017-01-05 – 2017-01-06 (×3): 10 mg via ORAL
  Filled 2017-01-05 (×2): qty 1
  Filled 2017-01-05: qty 2

## 2017-01-05 MED ORDER — AMLODIPINE BESYLATE 5 MG PO TABS: 10.0000 mg | ORAL_TABLET | Freq: Every day | ORAL | Status: DC

## 2017-01-05 NOTE — Progress Notes (Signed)
Subjective: Pt. Had a finger stick glucose of 28 at around 1:30 am (after insulin/dextrose was administered in ED for hyperkalemia temporizing), was given 50 mL IV dextrose, bringing glucose up to 112, glucose stable since. Pt. Complains of continued diffuse abdominal pain, but denies nausea/vomitng. Pt. Complains of night sweats last night, as well as feeling hot this AM, with his room thermostat set in the low 80s.   Objective: Vital signs in last 24 hours: Vitals:   01/05/17 0145 01/05/17 0215 01/05/17 0250 01/05/17 0504  BP: (!) 178/105 (!) 165/107 (!) 165/95 (!) 175/95  Pulse: 65 (!) 57 (!) 56 (!) 51  Resp: 16   18  Temp:      TempSrc:      SpO2: 96% 98% 96% 95%  Weight:   77.5 kg (170 lb 12.8 oz)   Height:   _0  (1.88 m)     Physical Exam: Gen: Appears uncomfortable CV: mildly bradycardic, RRR, no MRG Pulm: Crackles right lower lobe base Abdominal: Mildly distended, diffusely tender to palpation, voluntary guarding  Lab Results: Basic Metabolic Panel:  Recent Labs Lab 01/04/17 2058 01/05/17 0301  NA 136 140  K 5.7* 4.4  CL 93* 97*  CO2 28 28  GLUCOSE 84 58*  BUN 45* 48*  CREATININE 14.04* 13.88*  CALCIUM 7.0* 6.7*   Liver Function Tests:  Recent Labs Lab 01/04/17 2058  AST 19  ALT 8*  ALKPHOS 164*  BILITOT 0.7  PROT 7.8  ALBUMIN 3.5    Recent Labs Lab 01/04/17 2058  LIPASE 25   No results for input(s): AMMONIA in the last 168 hours. CBC:  Recent Labs Lab 01/04/17 2206 01/05/17 0301  WBC 3.8* 4.5  HGB 10.8* 10.2*  HCT 34.8* 33.0*  MCV 84.3 84.2  PLT 162 141*   CBG:  Recent Labs Lab 01/04/17 2348 01/05/17 0128 01/05/17 0153 01/05/17 0459 01/05/17 0820  GLUCAP 80 28* 112* 73 72     Medications:  Scheduled Meds: . amLODipine  10 mg Oral QHS  . carvedilol  25 mg Oral BID WC  . cinacalcet  90 mg Oral Q supper  . dextrose  1 ampule Intravenous Once  . [START ON 01/06/2017] doxazosin  4 mg Oral QHS  . [START ON 01/06/2017]  FLUoxetine  40 mg Oral q morning - 10a  . [START ON 01/06/2017] gabapentin  100 mg Oral TID  . heparin  5,000 Units Subcutaneous Q8H  . lidocaine  15 mL Mouth/Throat Once  . metoCLOPramide (REGLAN) injection  5 mg Intravenous Q8H  . pantoprazole (PROTONIX) IV  40 mg Intravenous Q12H  . sevelamer carbonate  1,600 mg Oral Q2000  . sevelamer carbonate  2,400 mg Oral TID WC  . sodium chloride flush  3 mL Intravenous Q12H   Continuous Infusions: PRN Meds:.cyclobenzaprine, HYDROmorphone (DILAUDID) injection Assessment/Plan: Principal Problem:   Abdominal pain Active Problems:   ESRD on hemodialysis ([place])   Essential hypertension   Bilateral low back pain without sciatica   Hyperkalemia   GERD (gastroesophageal reflux disease)  Mr. Faughn is a 51 y/o man with ESRD on HD, CHF, and HTN being managed for nausea, vomiting with abdominal pain in setting of missed HD and hyperkalemia.  Nausea/Vomiting/Ab. Pain: The pt. Continues to have diffuse abdominal pain, but denies nausea/vomitng this morning. In the setting of absent leukocytosis and fever, neg. Abdominal/pelvis CT, and non-specific exam most likely diagnosis is still viral gastroenteritis. Pt. Feels he can take fluids and mdes. PO now.   -Supportive  care -PRN metoclopramide 79m PO -PRN hydromorphone 4 mg PO  Hyperkalemia: Pt. Presented with K+ of 5.7, ECG non-concerning, responded well to temporizing measures, K+ 4.4 this AM.   -HD tomorrow -If not severe, asymptomatic, and benign ECG--->kayexelate. If acidemic, NaHCO3. If severe and HD not possible, dextrose/insulin. Consider additional dextrose given hypoglycemia in response to this in ED.   ESRD: On HD MWF. Manage hyperkalemia as above. Electrolytes stable  -cinacalcet 90 mg QD -sevelamer 2,400 mg TID -HD tomorrow  GERD: stable.  -pantoprazole 40 mg   HTN: 150-170s/90-100s. Hx. Of chronic HTN. Stable  -home amlodipine 10 mg QHS  HF: Pt. Stable, not concerned for acute  decompensation.  -home carvedilol 25 mg BID   Dispo: Observation DVT ppx: Lovenox Diet: liquid    This is a MCareers information officerNote.  The care of the patient was discussed with Dr. WJuleen Chinaand the assessment and plan formulated with their assistance.  Please see their attached note for official documentation of the daily encounter.   LOS: 0 days   FJillyn Hidden Medical Student 01/05/2017, 9:50 AM

## 2017-01-05 NOTE — ED Notes (Signed)
Pt diaphoretic upon return from CT. Pt CBG 28, [place], [place] notified. Amp of D50 given to pt. Admitting also paged regarding pt sugar.

## 2017-01-05 NOTE — ED Notes (Signed)
Patient transported to CT

## 2017-01-05 NOTE — ED Notes (Signed)
Pt continuously calling out to ask this nurse for something to drink. Explained to pt he is to have nothing by mouth per the provider. Pt visibly frustrated, continues to request water.

## 2017-01-05 NOTE — Plan of Care (Signed)
Problem: Education: Goal: Knowledge of [place] [facility] Education information/materials will improve Outcome: Progressing Discussed plan of care for the evening with some teach back displayed.  Comments: Topics: high potassium and medications to relief the situation; pain management and dialysis.

## 2017-01-05 NOTE — ED Notes (Signed)
Pt given apple juice per admitting.

## 2017-01-05 NOTE — ED Notes (Signed)
Attempted report x1. 

## 2017-01-05 NOTE — ED Notes (Signed)
Pt O2 sat decreased to approx 92% on RA, pt placed on 2L [place].

## 2017-01-05 NOTE — ED Notes (Signed)
Admitting at the bedside.

## 2017-01-05 NOTE — Progress Notes (Signed)
   Subjective:  Patient seen and examined this morning on rounds.  He reports feeling a little better but still has abdominal pain and some nausea.  He would like to try some PO intake this morning.  Objective:  Vital signs in last 24 hours: Vitals:   01/05/17 0145 01/05/17 0215 01/05/17 0250 01/05/17 0504  BP: (!) 178/105 (!) 165/107 (!) 165/95 (!) 175/95  Pulse: 65 (!) 57 (!) 56 (!) 51  Resp: 16   18  Temp:      TempSrc:      SpO2: 96% 98% 96% 95%  Weight:   170 lb 12.8 oz (77.5 kg)   Height:   _0  (1.88 m)    General: lying in bed, no distress. HEENT: [place]/AT, EOMI, no scleral icterus Cardiac: RRR, no rubs, murmurs or gallops Pulm: clear to auscultation bilaterally, moving normal volumes of air, no wheeze or crackles Abd: soft, tender in all quadrants, nondistended, BS present Ext: warm and well perfused, trace pedal edema Neuro: alert and oriented X3, cranial nerves II-XII grossly intact   Assessment/Plan:  Principal Problem:   Abdominal pain Active Problems:   ESRD on hemodialysis (HCC)   Essential hypertension   Bilateral low back pain without sciatica   Hyperkalemia   GERD (gastroesophageal reflux disease)   Aortic atherosclerosis [facility])  [name] is a 51 y.o. male with ESRD on HD, CHF and HTN who presents with N/V and abdominal pain and missed HD with hyperkalemia.  1. Nausea, Vomiting, and Abdominal Pain CT scan this admission negative for acute intra-abdominal pathology.  Similar presentation last month that improved with supportive care.  Suspect symptoms due primarily to reflux gastritis, viral gastroenteritis, or dialysis non-adherence. - start clear diet, advance to renal diet as tolerated - he tolerated his PO blood pressure medication today so will stop IV formulations and resume home PO doses - Protonix PO BID - Reglan PO Q8H PRN - Dilaudid PO Q4H PRN - Has GI follow up scheduled later this month.  2. Hyperkalemia: Secondary to HD  non-adherence.  Stable this morning at  4.4.  Glucose has been a little low due to receiving insulin for treatment of K+ in the ED.  Has received D50 with appropriate response - HD tomorrow - Renal function panel in AM - monitor CBGs today   3. ESRD on HD: Per nephrology notes from this AM, he is on MWF schedule at [facility].  Has only attended one HD session last week.  This non-adherence contributing to his elevated K+ and possibly his abdominal pain as well. - HD tomorrow   4. HTN: on amlodipine 97m, Coreg 257mBID, cardura 86m29mHS, and clonidine patch every Wednesday - continue home meds  Code Status - Full  Dispo: Anticipated discharge in approximately 1 day(s) pending improvement in abdominal pain, advancing diet, and HD.   Frank [name] 01/05/2017, 10:14 AM

## 2017-01-05 NOTE — Progress Notes (Signed)
Night shift floor coverage note  The patient was seen during a RRT call due to symptomatic bradycardia in the 20s and 30s. He had received carvedilol 25 mg earlier in the evening and has been taking hydromorphone 4 mg po for abdominal pain. He was found with external pacemaker pads, on chest complaining of dyspnea and non-radiated pressure like chest discomfort.    During RRT Blood pressure range from 140s/70s, respirations 17-19 BPM, he was satting in the high 90s to 100% on nasal cannula oxygen.  General : Awake, alert, oriented 4. Cardiovascular: S1S2, bradycardic at 28 bpm, Resp: CTA bilaterally, Abdomen: Mild distention, soft, mild diffuse tenderness without guarding or rebound. Extremities: Trace pitting edema of the lower extremities.    Assessment: Symptomatic bradycardia Atropine 0.5 mg 2 IVP and glucagon 1 mg IVP given. The patient's heart rate increased to the 60s and 70s after second dose of atropine. Primary providers arrived to the room and care for the patient was turned over to them.  Tennis Must, M.D.

## 2017-01-05 NOTE — ED Notes (Signed)
Decreased pt to 1 L . Pt O2 sat remains at 96%.

## 2017-01-05 NOTE — Progress Notes (Signed)
S: Paged by RN that patient developed acute onset chest pain with bradycardia, HR persistently low 20s. Rapid response was called to bedside. S/p atropine 0.5 mg x 1 on arrival. Patient had been given metoprolol approximately 2 hours prior.   O:   Vitals:   01/05/17 0250 01/05/17 0504  BP: (!) 165/95 (!) 175/95  Pulse: (!) 56 (!) 51  Resp:  18   Physical Exam Constitutional: In distress, leaning over side of bed dry heaving  Cardiovascular: HR  50-60s on monitor  Pulmonary/Chest: Breathing comfortably on room air  Extremities: Cool to touch, minimal edema  Neurological: A&Ox3, CN II - XII grossly intact.   A: Patient is a 51 yo M with pmhx of ESRD on HD and CHF admitted with N/V likely due to viral enteritis and HD non-compliance, now with bradycardia after taking metoprolol approximately 2 hours prior. Patient reports he does not take this medicine at home and that it was discontinued one year ago. STAT EKG largely unchanged from admission. HR and chest pain improved with atropine.   Plan: -- Discontinue metoprolol  -- Transfer to SDU -- Tele monitoring  -- f/u STAT BMP, mag, phos, and troponin  -- IV Zofran 40m x 1 for nausea -- IV dilaudid 1 mg x 1 for abdominal pain   CVelna Ochs M.D. Pager: [phone]/17/2018, 9:30 PM

## 2017-01-05 NOTE — Significant Event (Signed)
Rapid Response Event Note  Overview: Time Called: 2031 Arrival Time: 2032 Event Type: Cardiac  Initial Focused Assessment:  Called by bedside RN for hypotension and bradycardia with rate of 28.  On my arrival, EKG was in process.  HR palpated on R brachial was 32.  BP stable 643C systolic. Pt was diaphoretic, AAO x 4, complaining of L sided non radiating chest pain.  Pacer pads placed and pt connected to zoll. RN reports that pt received coreg 25 mg at around 1700.    Dr. Olevia Bowens at bedside, 0.5 mg atropine and 1 mg glucagon IV given.  NS bolus 500 ml infusing per Dr. Faye Ramsay.  Pt responded well to atropine, HR NSR 38F, BP 840 systolic following administration.  CP resolved, pt reports feeling much better.  Primary MD to d/c scheduled coreg.      Interventions:  EKG, Atropine 0.61m, glucagon 1 mg, zofran 462m  CBG 128.  Troponin, mag, bmet ordered by primary MD.  Plan for SDU transfer.    Plan of Care (if not transferred):  Event Summary: Name of Physician Notified: Dr. OrOlevia Bowenst [id number]  Name of Consulting Physician Notified: Dr GuFaye Ramsayt 2035  Outcome: Transferred (Comment)     DUPam Drown

## 2017-01-05 NOTE — Progress Notes (Signed)
Frank [name] is a 51 Y/O AA male with ESRD who has hemodialysis MWF at [facility]. PMH HTN, extreme medical non-compliance. Has only attended one hemodialysis session last week, stayed 3 hours 25 minutes of scheduled treatment. Has been admitted as observation patient for abdominal pain, nausea and vomiting. Presented last PM  K+ 5.7,Scr 14.04 BUN 45 K+ 5.7. Rec'd dextrose and insulin in ED. CT of abd/pelvis did not reveal any findings to explain patient's current symptoms. K+ this AM 4.4. Will enter HD orders for patient tomorrow on schedule tomorrow. Please advise [place] if patient status upgraded to in-patient and we will consult formally.   Hemodialysis orders: [facility] MWF 4 hr 180 NRe 400/Auto 2.0 72.5 kg 2.0 K/2.25 Ca  Heparin: 2000 units IV TIW Calcitriol: 2 mcg PO TIW  BMD meds:  Renvela 800 mg 3 tabs PO TID AC and 2 tabs PO w/snack Sensipar 90 mg PO q PM  Last labs: Ca 8.1 Phos 6.7 PTH 2190 HGB 11.1  Juanell Fairly NP-C [facility] [phone] (Pager)  Pt seen, examined and agree w A/P as above.  Kelly Splinter MD [employer] pager [phone]   01/05/2017, 1:07 PM

## 2017-01-06 DIAGNOSIS — I428 Other cardiomyopathies: Secondary | ICD-10-CM | POA: Diagnosis present

## 2017-01-06 DIAGNOSIS — I700 Atherosclerosis of aorta: Secondary | ICD-10-CM | POA: Diagnosis present

## 2017-01-06 DIAGNOSIS — D649 Anemia, unspecified: Secondary | ICD-10-CM | POA: Diagnosis present

## 2017-01-06 DIAGNOSIS — N2581 Secondary hyperparathyroidism of renal origin: Secondary | ICD-10-CM | POA: Diagnosis present

## 2017-01-06 DIAGNOSIS — Z87891 Personal history of nicotine dependence: Secondary | ICD-10-CM | POA: Diagnosis not present

## 2017-01-06 DIAGNOSIS — D631 Anemia in chronic kidney disease: Secondary | ICD-10-CM | POA: Diagnosis not present

## 2017-01-06 DIAGNOSIS — Z9115 Patient's noncompliance with renal dialysis: Secondary | ICD-10-CM | POA: Diagnosis not present

## 2017-01-06 DIAGNOSIS — R1030 Lower abdominal pain, unspecified: Secondary | ICD-10-CM | POA: Diagnosis not present

## 2017-01-06 DIAGNOSIS — R001 Bradycardia, unspecified: Secondary | ICD-10-CM | POA: Diagnosis not present

## 2017-01-06 DIAGNOSIS — I120 Hypertensive chronic kidney disease with stage 5 chronic kidney disease or end stage renal disease: Secondary | ICD-10-CM | POA: Diagnosis not present

## 2017-01-06 DIAGNOSIS — I5042 Chronic combined systolic (congestive) and diastolic (congestive) heart failure: Secondary | ICD-10-CM | POA: Diagnosis present

## 2017-01-06 DIAGNOSIS — A084 Viral intestinal infection, unspecified: Secondary | ICD-10-CM | POA: Diagnosis present

## 2017-01-06 DIAGNOSIS — R1013 Epigastric pain: Secondary | ICD-10-CM | POA: Diagnosis not present

## 2017-01-06 DIAGNOSIS — Z9119 Patient's noncompliance with other medical treatment and regimen: Secondary | ICD-10-CM | POA: Diagnosis not present

## 2017-01-06 DIAGNOSIS — M898X9 Other specified disorders of bone, unspecified site: Secondary | ICD-10-CM | POA: Diagnosis present

## 2017-01-06 DIAGNOSIS — E1122 Type 2 diabetes mellitus with diabetic chronic kidney disease: Secondary | ICD-10-CM | POA: Diagnosis present

## 2017-01-06 DIAGNOSIS — I132 Hypertensive heart and chronic kidney disease with heart failure and with stage 5 chronic kidney disease, or end stage renal disease: Secondary | ICD-10-CM | POA: Diagnosis present

## 2017-01-06 DIAGNOSIS — E875 Hyperkalemia: Secondary | ICD-10-CM | POA: Diagnosis present

## 2017-01-06 DIAGNOSIS — Z86718 Personal history of other venous thrombosis and embolism: Secondary | ICD-10-CM | POA: Diagnosis not present

## 2017-01-06 DIAGNOSIS — Z888 Allergy status to other drugs, medicaments and biological substances status: Secondary | ICD-10-CM | POA: Diagnosis not present

## 2017-01-06 DIAGNOSIS — Z885 Allergy status to narcotic agent status: Secondary | ICD-10-CM | POA: Diagnosis not present

## 2017-01-06 DIAGNOSIS — Z992 Dependence on renal dialysis: Secondary | ICD-10-CM | POA: Diagnosis not present

## 2017-01-06 DIAGNOSIS — T8612 Kidney transplant failure: Secondary | ICD-10-CM | POA: Diagnosis present

## 2017-01-06 DIAGNOSIS — K219 Gastro-esophageal reflux disease without esophagitis: Secondary | ICD-10-CM | POA: Diagnosis present

## 2017-01-06 DIAGNOSIS — M545 Low back pain: Secondary | ICD-10-CM | POA: Diagnosis present

## 2017-01-06 DIAGNOSIS — N186 End stage renal disease: Secondary | ICD-10-CM | POA: Diagnosis present

## 2017-01-06 LAB — RENAL FUNCTION PANEL
ANION GAP: 13 (ref 5–15)
Albumin: 3.2 g/dL — ABNORMAL LOW (ref 3.5–5.0)
Albumin: 3.1 g/dL — ABNORMAL LOW (ref 3.5–5.0)
Anion gap: 15 (ref 5–15)
BUN: 58 mg/dL — ABNORMAL HIGH (ref 6–20)
BUN: 59 mg/dL — ABNORMAL HIGH (ref 6–20)
CHLORIDE: 93 mmol/L — AB (ref 101–111)
CO2: 27 mmol/L (ref 22–32)
CO2: 24 mmol/L (ref 22–32)
Calcium: 6.7 mg/dL — ABNORMAL LOW (ref 8.9–10.3)
Calcium: 6.9 mg/dL — ABNORMAL LOW (ref 8.9–10.3)
Chloride: 96 mmol/L — ABNORMAL LOW (ref 101–111)
Creatinine, Ser: 15.08 mg/dL — ABNORMAL HIGH (ref 0.61–1.24)
Creatinine, Ser: 15.19 mg/dL — ABNORMAL HIGH (ref 0.61–1.24)
GFR calc Af Amer: 4 mL/min — ABNORMAL LOW
GFR calc non Af Amer: 3 mL/min — ABNORMAL LOW (ref >60)
GFR calc non Af Amer: 3 mL/min — ABNORMAL LOW
GFR, EST AFRICAN AMERICAN: 4 mL/min — AB (ref >60)
Glucose, Bld: 102 mg/dL — ABNORMAL HIGH (ref 65–99)
Glucose, Bld: 36 mg/dL — CL (ref 65–99)
Phosphorus: 6.5 mg/dL — ABNORMAL HIGH (ref 2.5–4.6)
Phosphorus: 6.3 mg/dL — ABNORMAL HIGH (ref 2.5–4.6)
Potassium: 4.7 mmol/L (ref 3.5–5.1)
Sodium: 133 mmol/L — ABNORMAL LOW (ref 135–145)
Sodium: 135 mmol/L (ref 135–145)

## 2017-01-06 LAB — CBC
HCT: 32.6 % — ABNORMAL LOW (ref 39.0–52.0)
Hemoglobin: 10.6 g/dL — ABNORMAL LOW (ref 13.0–17.0)
MCH: 26.7 pg (ref 26.0–34.0)
MCHC: 32.5 g/dL (ref 30.0–36.0)
MCV: 82.1 fL (ref 78.0–100.0)
Platelets: 139 10*3/uL — ABNORMAL LOW (ref 150–400)
RBC: 3.97 MIL/uL — AB (ref 4.22–5.81)
RDW: 18.1 % — ABNORMAL HIGH (ref 11.5–15.5)
WBC: 5.4 10*3/uL (ref 4.0–10.5)

## 2017-01-06 LAB — GLUCOSE, CAPILLARY
GLUCOSE-CAPILLARY: 45 mg/dL — AB (ref 65–99)
GLUCOSE-CAPILLARY: 152 mg/dL — AB (ref 65–99)
Glucose-Capillary: 47 mg/dL — ABNORMAL LOW (ref 65–99)
Glucose-Capillary: 119 mg/dL — ABNORMAL HIGH (ref 65–99)
Glucose-Capillary: 43 mg/dL — CL (ref 65–99)
Glucose-Capillary: 55 mg/dL — ABNORMAL LOW (ref 65–99)
Glucose-Capillary: 196 mg/dL — ABNORMAL HIGH (ref 65–99)
Glucose-Capillary: 108 mg/dL — ABNORMAL HIGH (ref 65–99)

## 2017-01-06 LAB — POTASSIUM: Potassium: 6.4 mmol/L (ref 3.5–5.1)

## 2017-01-06 LAB — MRSA PCR SCREENING: MRSA by PCR: POSITIVE — AB

## 2017-01-06 MED ORDER — HYDRALAZINE HCL 50 MG PO TABS
100.0000 mg | ORAL_TABLET | Freq: Two times a day (BID) | ORAL | Status: DC
  Administered 2017-01-06 – 2017-01-07 (×3): 100 mg via ORAL
  Filled 2017-01-06 (×3): qty 2

## 2017-01-06 MED ORDER — INSULIN ASPART 100 UNIT/ML ~~LOC~~ SOLN
10.0000 [IU] | Freq: Once | SUBCUTANEOUS | Status: AC
  Administered 2017-01-06: 10 [IU] via SUBCUTANEOUS

## 2017-01-06 MED ORDER — SODIUM CHLORIDE 0.9 % IV SOLN: 100.0000 mL | INTRAVENOUS | Status: DC | PRN

## 2017-01-06 MED ORDER — LIDOCAINE HCL (PF) 1 % IJ SOLN: 5.0000 mL | INTRAMUSCULAR | Status: DC | PRN

## 2017-01-06 MED ORDER — HYDROMORPHONE HCL 1 MG/ML IJ SOLN
1.0000 mg | Freq: Once | INTRAMUSCULAR | Status: AC
  Administered 2017-01-06: 1 mg via INTRAVENOUS
  Filled 2017-01-06: qty 1

## 2017-01-06 MED ORDER — DEXTROSE 50 % IV SOLN
INTRAVENOUS | Status: AC
  Administered 2017-01-06: 50 mL via INTRAVENOUS
  Filled 2017-01-06: qty 50

## 2017-01-06 MED ORDER — SODIUM POLYSTYRENE SULFONATE 15 GM/60ML PO SUSP
15.0000 g | Freq: Once | ORAL | Status: AC
  Administered 2017-01-06: 15 g via ORAL
  Filled 2017-01-06: qty 60

## 2017-01-06 MED ORDER — GLUCAGON HCL RDNA (DIAGNOSTIC) 1 MG IJ SOLR: 1.0000 mg | Freq: Once | INTRAMUSCULAR | Status: DC

## 2017-01-06 MED ORDER — CHLORHEXIDINE GLUCONATE CLOTH 2 % EX PADS
6.0000 | MEDICATED_PAD | Freq: Every day | CUTANEOUS | Status: DC
  Administered 2017-01-07: 6 via TOPICAL

## 2017-01-06 MED ORDER — DEXTROSE 50 % IV SOLN
1.0000 | Freq: Once | INTRAVENOUS | Status: AC
  Administered 2017-01-06: 50 mL via INTRAVENOUS
  Filled 2017-01-06: qty 50

## 2017-01-06 MED ORDER — SEVELAMER CARBONATE 800 MG PO TABS: 800.0000 mg | ORAL_TABLET | Freq: Three times a day (TID) | ORAL | 0 refills | Status: DC

## 2017-01-06 MED ORDER — ALTEPLASE 2 MG IJ SOLR: 2.0000 mg | Freq: Once | INTRAMUSCULAR | Status: DC | PRN

## 2017-01-06 MED ORDER — HEPARIN SODIUM (PORCINE) 1000 UNIT/ML DIALYSIS: 2000.0000 [IU] | Freq: Once | INTRAMUSCULAR | Status: DC

## 2017-01-06 MED ORDER — ISOSORBIDE MONONITRATE ER 60 MG PO TB24
60.0000 mg | ORAL_TABLET | Freq: Every day | ORAL | Status: DC
  Administered 2017-01-06 – 2017-01-07 (×2): 60 mg via ORAL
  Filled 2017-01-06 (×2): qty 1

## 2017-01-06 MED ORDER — MUPIROCIN 2 % EX OINT
1.0000 "application " | TOPICAL_OINTMENT | Freq: Two times a day (BID) | CUTANEOUS | Status: DC
  Administered 2017-01-07: 1 via NASAL
  Filled 2017-01-06: qty 22

## 2017-01-06 MED ORDER — DEXTROSE 50 % IV SOLN
INTRAVENOUS | Status: AC
  Administered 2017-01-06: 50 mL
  Filled 2017-01-06: qty 50

## 2017-01-06 MED ORDER — LIDOCAINE-PRILOCAINE 2.5-2.5 % EX CREA: 1.0000 "application " | TOPICAL_CREAM | CUTANEOUS | Status: DC | PRN

## 2017-01-06 MED ORDER — ONDANSETRON HCL 4 MG/2ML IJ SOLN
4.0000 mg | Freq: Once | INTRAMUSCULAR | Status: AC
Start: 2017-01-06 — End: 2017-01-06
  Administered 2017-01-06: 4 mg via INTRAVENOUS
  Filled 2017-01-06: qty 2

## 2017-01-06 MED ORDER — SODIUM CHLORIDE 0.9 % IV SOLN
1.0000 g | Freq: Once | INTRAVENOUS | Status: AC
  Administered 2017-01-06: 1 g via INTRAVENOUS
  Filled 2017-01-06: qty 10

## 2017-01-06 MED ORDER — HYDROMORPHONE HCL 2 MG PO TABS
4.0000 mg | ORAL_TABLET | ORAL | Status: DC | PRN
  Administered 2017-01-06 – 2017-01-07 (×4): 4 mg via ORAL
  Filled 2017-01-06 (×4): qty 2

## 2017-01-06 MED ORDER — HEPARIN SODIUM (PORCINE) 1000 UNIT/ML DIALYSIS
1000.0000 [IU] | INTRAMUSCULAR | Status: DC | PRN
  Filled 2017-01-06: qty 1

## 2017-01-06 MED ORDER — ALBUTEROL SULFATE (2.5 MG/3ML) 0.083% IN NEBU
2.5000 mg | INHALATION_SOLUTION | Freq: Once | RESPIRATORY_TRACT | Status: AC
  Administered 2017-01-06: 2.5 mg via RESPIRATORY_TRACT
  Filled 2017-01-06: qty 3

## 2017-01-06 MED ORDER — PENTAFLUOROPROP-TETRAFLUOROETH EX AERO: 1.0000 "application " | INHALATION_SPRAY | CUTANEOUS | Status: DC | PRN

## 2017-01-06 NOTE — Progress Notes (Signed)
Potassium > 7.5, patient A&O x 3 and denies chest pain. Heart rate 50s. Currently receiving calcium gluconate infusion. Will repeat insulin, D50, and kayexalate. Ordered albuterol neb and repeat potassium. Paged nephrology, waiting return call.   Velna Ochs, M.D. Pager: [phone] 01/06/2017, 2:45 AM

## 2017-01-06 NOTE — Plan of Care (Signed)
Problem: Education: Goal: Knowledge of [facility] Education information/materials will improve Outcome: Progressing Discussed discharge, transfer and dialysis with patient with some teach back displayed

## 2017-01-06 NOTE — Progress Notes (Signed)
Internal Medicine Attending:   I saw and examined the patient. I reviewed the resident's note and I agree with the resident's findings and plan as documented in the resident's note. Patient had eventful night, developed bradycardia with resumption of home BB (non-compliant at home for last year) as well as worsened hyperkalemia with some EKG changes. Was transferred to stepdown, treated with atropine and K+ lowering strategies, unfortunately the insulin has caused some hypoglycemia today, we have been treating this with D50, however if persist we may try glucagon.  Otherwise his abdominal pain appears to be improving with Protonix and dialysis.  I suspect like his previous admission his abdominal pain is likely due to poorly treated GERD as well as dialysis noncompliance.

## 2017-01-06 NOTE — Discharge Summary (Signed)
Name: Frank [name] MRN: [id number] DOB: 1966-04-27 51 y.o. PCP: Patient, [name]  Date of Admission: 01/04/2017  9:00 PM Date of Discharge: 01/07/2017 Attending Physician: Lucious Groves, DO  Discharge Diagnosis: 1.    Abdominal pain   ESRD on hemodialysis [facility])  Discharge Medications: Allergies as of 01/07/2017      Reactions   Butalbital-apap-caffeine Shortness Of Breath, Swelling, Other (See Comments)   Swelling in throat   Ferrlecit [na Ferric Gluc Cplx In Sucrose] Shortness Of Breath, Swelling, Other (See Comments)   Swelling in throat, tolerates Venofor   Minoxidil Shortness Of Breath   Tylenol [acetaminophen] Anaphylaxis, Swelling   Darvocet [propoxyphene N-acetaminophen] Hives      Medication List    STOP taking these medications   carvedilol 25 MG tablet Commonly known as:  COREG   HYDROmorphone 4 MG tablet Commonly known as:  DILAUDID     TAKE these medications   amLODipine 10 MG tablet Commonly known as:  NORVASC Take 1 tablet (10 mg total) by mouth at bedtime.   cloNIDine 0.3 mg/24hr patch Commonly known as:  CATAPRES - Dosed in mg/24 hr Place onto the skin once a week. On Wednesday   doxazosin 4 MG tablet Commonly known as:  CARDURA Take 4 mg by mouth at bedtime.   FLUoxetine 40 MG capsule Commonly known as:  PROZAC Take 40 mg by mouth every morning.   gabapentin 100 MG capsule Commonly known as:  NEURONTIN Take 100 mg by mouth 3 (three) times daily.   hydrALAZINE 100 MG tablet Commonly known as:  APRESOLINE Take 1 tablet (100 mg total) by mouth 2 (two) times daily.   isosorbide mononitrate 60 MG 24 hr tablet Commonly known as:  IMDUR Take 1 tablet (60 mg total) by mouth daily.   metoCLOPramide 5 MG tablet Commonly known as:  REGLAN Take 1 tablet (5 mg total) by mouth every 8 (eight) hours as needed for nausea.   pantoprazole 40 MG tablet Commonly known as:  PROTONIX Take 1 tablet (40 mg total) by mouth 2 (two) times daily before a  meal.   sevelamer carbonate 800 MG tablet Commonly known as:  RENVELA Take 1 tablet (800 mg total) by mouth 3 (three) times daily with meals.   traMADol 50 MG tablet Commonly known as:  ULTRAM Take 1 tablet (50 mg total) by mouth every 12 (twelve) hours as needed for moderate pain or severe pain.     ASK your doctor about these medications   cyclobenzaprine 10 MG tablet Commonly known as:  FLEXERIL Take 10 mg by mouth 3 (three) times daily as needed for muscle spasms. Ask about: Should I take this medication?   HYDROmorphone 4 MG tablet Commonly known as:  DILAUDID Take 0.5 tablets (2 mg total) by mouth every 6 (six) hours as needed for moderate pain. Ask about: Should I take this medication?       Disposition and follow-up:   Mr.Graison D Kanouse was discharged from [facility] in Good condition.  At the hospital follow up visit please address:  1.  Abdominal pain- he was started on protonix 40 mg BID and provided dilaudid 2 mg #10 and reglan rx. Please reinforce that has GI appointment scheduled.   2.  Labs / imaging needed at time of follow-up: renal function panel   3.  Pending labs/ test needing follow-up: none   Follow-up Appointments: Follow-up Information    Primary Care at [facility]. Schedule an appointment  as soon as possible for a visit in 5 day(s).   Specialty:  Family Medicine Contact information: [address] [phone]       Doree Albee, MD Follow up on 01/14/2017.   Specialty:  Internal Medicine Why:  11:30 for new patient apt and hospital follow up, please bring insurance card and all medications Contact information: [place] [address] [phone]           Hospital Course by problem list:    Abdominal pain   GERD (gastroesophageal reflux disease) 51 year old man with PMH ESRD on HD, CHF, HTN, and GERD who presented with 3 days of nausea, vomiting, and abdominal pain  which started during dialysis two days prior to admission and continued through the day of admission. In the ED he was noted to be hyperkalemic and CT scan of the abdomen showed no acute intra abdominal pathology. He was though to have viral gastroenteritis superimposed on esophogeal reflux. He was started on GI cocktail, IV protonix BID, IV reglan TID, IV dilaudid 1 mg q2h. On the morning following admission his pain had improved and he asked to advance his diet, this was done and dilaudid was switched to PO. Gradually his abdominal pain improved and he was found to tolerate a regular diet. Two days following admission he was medically prepared for discharge, he insisted that his abdominal pain had been controlled only with dilaudid and that he would need this for pain control at home. He had an appointment scheduled with GI on June 27 for follow up of an admission about one month prior to his presentation.     ESRD on hemodialysis ([place])   Hyperkalemia He is on MWF HD at [facility], hyperkalemia was secondary to missed HD (he had attended only one HD session in the week prior to admission and had not completed that session entirely) and he received insulin and dextrose in the ED and he went for inpatient dialysis the day following admission.     Essential hypertension  He confirmed his home medication list which included amlodipine 10 mg, coreg 25 mg BID, cardura 4 mg qhs, and clonidine patch every wednesday so these medications were gradually started from the first to the second day of admission. Two hours after coreg was restarted he developed acute onset chest pain with bradycardia. RRT was called and he received atropine 0.5 mg and glucagon. He then reported that he did not take the medication at home and that it had been discontinued over a year ago.     Aortic atherosclerosis ([place]) Found incidentally on CT scan of the abdomen.   Discharge Vitals:   BP (!) 159/92 (BP Location: Right  Arm)   Pulse 69   Temp 98.1 F (36.7 C) (Oral)   Resp 16   Ht _0  (1.88 m)   Wt 165 lb (74.8 kg)   SpO2 98%   BMI 21.18 kg/m   Pertinent Labs, Studies, and Procedures:  CT scan 01/05/2017  IMPRESSION: 1. No acute abnormality seen to explain the patient's symptoms. 2. Mild cardiomegaly. Scattered coronary artery calcifications seen. 3. Severe chronic bilateral renal atrophy, with large left renal cysts. Left iliac fossa transplant kidney is not well characterized. 4. Diffuse aortic atherosclerosis. 5. Underlying renal osteodystrophy noted.  Discharge Instructions: Discharge Instructions    Diet - low sodium heart healthy    Complete by:  As directed    Diet - low sodium  heart healthy    Complete by:  As directed    Increase activity slowly    Complete by:  As directed    Increase activity slowly    Complete by:  As directed       Signed: Ledell Noss, MD 01/13/2017, 8:51 PM   Pager: [phone]

## 2017-01-06 NOTE — Consult Note (Signed)
 [name] Renal Consultation Note    Indication for Consultation:  Management of ESRD/hemodialysis; anemia, hypertension/volume and secondary hyperparathyroidism PCP:  STM:[id number] Frank [name] is a 51 Y/O AA male with ESRD who has hemodialysis MWF at [facility]. PMH HTN, extreme medical non-compliance. Had only attended one hemodialysis session last week. Last HD 01/02/2017, stayed 3 hours 25 minutes of 4 hour scheduled treatment. Was initially admitted as observation 01/04/17 patient for abdominal pain, nausea and vomiting. Presented with Scr 14.04 BUN 45 K+ 5.7. Rec'd dextrose and insulin in ED and K+ was 4.4 01/05/17. CT of abd/pelvis did not reveal any findings to explain patient's current symptoms.   Apparently since admission, patient has developed issues with bradycardia and hyperkalemia, L sided chest pain. K+ 7.5 this AM. It is not clear source of hyperkalemia as he has been on renal diet. Hyperkalemia was managed overnight with kayexalate, insulin, ca gluconate and D50W.  Last K+ 4.7 this AM. Had hemodialysis this AM on schedule.   Patient is awake, alert, denies chest pain/SOB. C/O abdominal pain for which he has just rec'd pain medication. Patient denies eating food from outside hospital. Says worst problem is abdominal pain. He denies fever, chills, + N, V, denies diarrhea, says he has some issues with constipation and feeling bloated, no bloody or tarry stools. Denies flank pain, dysuria, hematuria, headaches, vision or hearing changes, falls or mechanical trauma. Says he has been able to eat, appetite has been OK.   Patient is non-compliant with OP HD prescription. As noted above, only one partial session of HD last week. Fortunately, he does not usually have high IDWG and has been leaving only slightly above EDW. Last phos 6.7 PTH 2190, spotty compliance with binders/sensipar.    Past Medical History:  Diagnosis Date  . Anemia   . Anxiety   . Chronic combined  systolic and diastolic CHF (congestive heart failure) (HCC)    a. EF 20-25% by echo in 08/2015 b. echo 10/2015: EF 35-40%, diffuse HK, severe LAE, moderate RAE, small pericardial effusion  . Complication of anesthesia    itching, sore throat  . Depression   . Dialysis patient ([place])   . ESRD (end stage renal disease) ([place])    due to HTN per patient, followed at [facility], s/p failed kidney transplant - dialysis Tue, Th, Sat  . Hyperkalemia 12/2015  . Hypertension   . Junctional rhythm    a. noted in 08/2015: hyperkalemic at that time  b. 12/2015: presented in junctional rhythm w/ K+ of 6.6. Resolved with improvement of K+ levels.  . Nonischemic cardiomyopathy ([place])    a. 08/2014: cath showing minimal CAD, but tortuous arteries noted.   . Personal history of DVT (deep vein thrombosis)/ PE 05/26/2016   In Oct 2015 had small subsemental LUL PE w/o DVT (LE dopplers neg) and was felt to be HD cath related, treated w coumadin.  IN May 2016 had small vein DVT (acute/subacute) in the R basilic/ brachial veins of the RUE, resumed on coumadin.  Had R sided HD cath at that time.    . Renal insufficiency   . Shortness of breath   . Type II diabetes mellitus (HCC)    No history per patient, but remains under history as A1c would not be accurate given on dialysis   Past Surgical History:  Procedure Laterality Date  . CAPD INSERTION    . CAPD REMOVAL    . INGUINAL HERNIA REPAIR Right 02/14/2015   Procedure: REPAIR INCARCERATED RIGHT  INGUINAL HERNIA;  Surgeon: [name], MD;  Location: [place];  Service: General;  Laterality: Right;  . INSERTION OF DIALYSIS CATHETER Right 09/23/2015   Procedure: exchange of Right internal Dialysis Catheter.;  Surgeon: [name], MD;  Location: [place];  Service: Vascular;  Laterality: Right;  . IR GENERIC HISTORICAL  07/16/2016   IR US GUIDE VASC ACCESS LEFT 07/16/2016 Corrie Mckusick, DO [facility]  . IR GENERIC HISTORICAL Left 07/16/2016   IR THROMBECTOMY AV FISTULA  W/THROMBOLYSIS/PTA INC/SHUNT/IMG LEFT 07/16/2016 Corrie Mckusick, DO [facility]  . KIDNEY RECEIPIENT  2006   failed and started HD in March 2014  . LEFT HEART CATHETERIZATION WITH CORONARY ANGIOGRAM N/A 09/02/2014   Procedure: LEFT HEART CATHETERIZATION WITH CORONARY ANGIOGRAM;  Surgeon: [name], MD;  Location: [facility] CATH LAB;  Service: Cardiovascular;  Laterality: N/A;   Family History  Problem Relation Age of Onset  . Hypertension Other    Social History:  reports that he has quit smoking. His smoking use included Cigarettes. He smoked 0.00 packs per day for 1.00 year. He has never used smokeless tobacco. He reports that he does not drink alcohol or use drugs. Allergies  Allergen Reactions  . Butalbital-Apap-Caffeine Shortness Of Breath, Swelling and Other (See Comments)    Swelling in throat  . Ferrlecit [Na Ferric Gluc Cplx In Sucrose] Shortness Of Breath, Swelling and Other (See Comments)    Swelling in throat, tolerates Venofor  . Minoxidil Shortness Of Breath  . Tylenol [Acetaminophen] Anaphylaxis and Swelling  . Darvocet [Propoxyphene N-Acetaminophen] Hives   Prior to Admission medications   Medication Sig Start Date End Date Taking? Authorizing Provider  amLODipine (NORVASC) 10 MG tablet Take 1 tablet (10 mg total) by mouth at bedtime. 12/01/16  Yes Rice, Resa Miner, MD  carvedilol (COREG) 25 MG tablet Take 25 mg by mouth 2 (two) times daily. 12/31/16  Yes [provider]  cloNIDine (CATAPRES - DOSED IN MG/24 HR) 0.3 mg/24hr patch Place onto the skin once a week. On Wednesday 12/31/16  Yes [provider]  cyclobenzaprine (FLEXERIL) 10 MG tablet Take 10 mg by mouth 3 (three) times daily as needed for muscle spasms.  01/02/17 01/07/17 Yes [provider]  doxazosin (CARDURA) 4 MG tablet Take 4 mg by mouth at bedtime. 11/26/16  Yes [provider]  FLUoxetine (PROZAC) 40 MG capsule Take 40 mg by mouth every morning. 11/26/16  Yes [provider]  gabapentin (NEURONTIN) 100 MG capsule Take 100 mg by mouth 3 (three) times daily. 12/31/16  Yes [provider]  HYDROmorphone (DILAUDID) 4 MG tablet Take 4 mg by mouth every 4 (four) hours as needed for moderate pain.  12/31/16  Yes [provider]  metoCLOPramide (REGLAN) 5 MG tablet Take 1 tablet (5 mg total) by mouth every 8 (eight) hours as needed for nausea. 12/01/16  Yes Rice, Resa Miner, MD  pantoprazole (PROTONIX) 40 MG tablet Take 1 tablet (40 mg total) by mouth 2 (two) times daily before a meal. 12/01/16  Yes Rice, Resa Miner, MD  traMADol (ULTRAM) 50 MG tablet Take 1 tablet (50 mg total) by mouth every 12 (twelve) hours as needed for moderate pain or severe pain. Patient not taking: Reported on 01/04/2017 12/01/16   Collier Salina, MD   Current Facility-Administered Medications  Medication Dose Route Frequency Provider Last Rate Last Dose  . 0.9 %  sodium chloride infusion  100 mL Intravenous PRN Valentina Gu, NP      .  0.9 %  sodium chloride infusion  100 mL Intravenous PRN Valentina Gu, NP      . alteplase (CATHFLO ACTIVASE) injection 2 mg  2 mg Intracatheter Once PRN Valentina Gu, NP      . amLODipine (NORVASC) tablet 10 mg  10 mg Oral QHS Joni Reining C, DO   10 mg at 01/05/17 2328  . cinacalcet (SENSIPAR) tablet 90 mg  90 mg Oral Q supper Valentina Gu, NP   90 mg at 01/05/17 1648  . cyclobenzaprine (FLEXERIL) tablet 10 mg  10 mg Oral TID PRN Holley Raring, MD      . doxazosin (CARDURA) tablet 4 mg  4 mg Oral QHS Jule Ser, DO   4 mg at 01/05/17 2328  . FLUoxetine (PROZAC) capsule 40 mg  40 mg Oral q morning - 10a Holley Raring, MD      . gabapentin (NEURONTIN) capsule 100 mg  100 mg Oral TID Holley Raring, MD      . heparin injection 1,000 Units  1,000 Units Dialysis PRN Valentina Gu, NP      . heparin injection 2,000 Units  2,000 Units Dialysis Once in dialysis Valentina Gu, NP       . heparin injection 5,000 Units  5,000 Units Subcutaneous Q8H Holley Raring, MD      . HYDROmorphone (DILAUDID) tablet 4 mg  4 mg Oral Q4H PRN Jule Ser, DO   4 mg at 01/06/17 1232  . lidocaine (PF) (XYLOCAINE) 1 % injection 5 mL  5 mL Intradermal PRN Valentina Gu, NP      . lidocaine (XYLOCAINE) 2 % viscous mouth solution 15 mL  15 mL Mouth/Throat Once Holley Raring, MD   Stopped at 01/05/17 0154  . lidocaine-prilocaine (EMLA) cream 1 application  1 application Topical PRN Valentina Gu, NP      . metoCLOPramide (REGLAN) tablet 5 mg  5 mg Oral Q8H PRN Jule Ser, DO      . pantoprazole (PROTONIX) EC tablet 40 mg  40 mg Oral BID AC Jule Ser, DO   40 mg at 01/06/17 0727  . pentafluoroprop-tetrafluoroeth (GEBAUERS) aerosol 1 application  1 application Topical PRN Valentina Gu, NP      . sevelamer carbonate (RENVELA) tablet 1,600 mg  1,600 mg Oral Q2000 Valentina Gu, NP      . sevelamer carbonate (RENVELA) tablet 2,400 mg  2,400 mg Oral TID WC Valentina Gu, NP   Stopped at 01/06/17 [phone]  . sodium chloride flush (NS) 0.9 % injection 3 mL  3 mL Intravenous Q12H Holley Raring, MD   3 mL at 01/05/17 2200   Labs: Basic Metabolic Panel:  Recent Labs Lab 01/05/17 2116 01/06/17 0141 01/06/17 0357 01/06/17 0726  NA 134* 133*  --  135  K 6.1* >7.5* 6.4* 4.7  CL 96* 93*  --  96*  CO2 23 27  --  24  GLUCOSE 122* 102*  --  36*  BUN 52* 58*  --  59*  CREATININE 13.96* 15.08*  --  15.19*  CALCIUM 6.5* 6.7*  --  6.9*  PHOS  --  6.5*  --  6.3*   Liver Function Tests:  Recent Labs Lab 01/04/17 2058 01/06/17 0141 01/06/17 0726  AST 19  --   --   ALT 8*  --   --   ALKPHOS 164*  --   --   BILITOT 0.7  --   --   PROT 7.8  --   --  ALBUMIN 3.5 3.2* 3.1*    Recent Labs Lab 01/04/17 2058  LIPASE 25   No results for input(s): AMMONIA in the last 168 hours. CBC:  Recent Labs Lab 01/04/17 2206 01/05/17 0301 01/06/17 0726  WBC  3.8* 4.5 5.4  HGB 10.8* 10.2* 10.6*  HCT 34.8* 33.0* 32.6*  MCV 84.3 84.2 82.1  PLT 162 141* 139*   Cardiac Enzymes:  Recent Labs Lab 01/05/17 2116  TROPONINI <0.03   CBG:  Recent Labs Lab 01/05/17 2231 01/06/17 0745 01/06/17 0839 01/06/17 0943 01/06/17 1116  GLUCAP 146* 45* 47* 119* 43*   Iron Studies: No results for input(s): IRON, TIBC, TRANSFERRIN, FERRITIN in the last 72 hours. Studies/Results: Ct Abdomen Pelvis W Contrast  Result Date: 01/05/2017 CLINICAL DATA:  Acute onset of generalized abdominal pain, nausea and vomiting. Initial encounter. EXAM: CT ABDOMEN AND PELVIS WITH CONTRAST TECHNIQUE: Multidetector CT imaging of the abdomen and pelvis was performed using the standard protocol following bolus administration of intravenous contrast. CONTRAST:  124m ISOVUE-300 IOPAMIDOL (ISOVUE-300) INJECTION 61% COMPARISON:  CT of the abdomen and pelvis performed 09/27/2016, and MRI of the lumbar spine performed 10/07/2016 FINDINGS: Lower chest: The heart is mildly enlarged. Scattered coronary artery calcification is seen. Minimal right basilar atelectasis is noted. Hepatobiliary: The liver is unremarkable in appearance. The gallbladder is unremarkable in appearance. The common bile duct remains normal in caliber. Pancreas: The pancreas is within normal limits. Spleen: The spleen is unremarkable in appearance. Adrenals/Urinary Tract: The adrenal glands are unremarkable in appearance. There is severe chronic bilateral renal atrophy, with large left renal cysts. Mild nonspecific perinephric stranding is noted bilaterally. There is no evidence of hydronephrosis. No renal or ureteral stones are identified. There is poor visualization of a left iliac fossa transplant kidney. Stomach/Bowel: The stomach is unremarkable in appearance. The small bowel is within normal limits. The appendix is normal in caliber, without evidence of appendicitis. The colon is unremarkable in appearance.  Vascular/Lymphatic: Diffuse calcification is seen along the abdominal aorta and its branches. The abdominal aorta is otherwise grossly unremarkable. The inferior vena cava is grossly unremarkable. No retroperitoneal lymphadenopathy is seen. No pelvic sidewall lymphadenopathy is identified. Reproductive: The bladder is decompressed and not well assessed. The prostate remains normal in size. Other: No additional soft tissue abnormalities are seen. Musculoskeletal: No acute osseous abnormalities are identified. Mild underlying renal osteodystrophy is noted. The visualized musculature is unremarkable in appearance. IMPRESSION: 1. No acute abnormality seen to explain the patient's symptoms. 2. Mild cardiomegaly. Scattered coronary artery calcifications seen. 3. Severe chronic bilateral renal atrophy, with large left renal cysts. Left iliac fossa transplant kidney is not well characterized. 4. Diffuse aortic atherosclerosis. 5. Underlying renal osteodystrophy noted. Electronically Signed   By: JGarald BaldingM.D.   On: 01/05/2017 02:03    ROS: As per HPI otherwise negative.  Physical Exam: Vitals:   01/06/17 1100 01/06/17 1115 01/06/17 1133 01/06/17 1143  BP: (!) 163/101 (!) 182/154 (!) 197/112 (!) 152/92  Pulse: (!) 58 60 (!) 52 63  Resp: _0 Temp:    97.4 F (36.3 C)  TempSrc:    Oral  SpO2: 95% 94% 94%   Weight:    74.6 kg (164 lb 7.4 oz)  Height:         General: Well developed, well nourished, in no acute distress. Head: Normocephalic, atraumatic, sclera non-icteric, mucus membranes are moist Neck: Supple. JVD not elevated. Lungs: Clear bilaterally to auscultation without wheezes, rales, or rhonchi. Breathing is  unlabored. Heart: RRR with S1 S2. No murmurs, rubs, or gallops appreciated. Abdomen: Soft, non-tender, non-distended with normoactive bowel sounds. No rebound/guarding. No obvious abdominal masses. M-S:  Strength and tone appear normal for age. Lower extremities:without edema  or ischemic changes, no open wounds  Neuro: Alert and oriented X 3. Moves all extremities spontaneously. Psych:  Responds to questions appropriately with a normal affect. Dialysis Access:  Hemodialysis orders: [facility] MWF 4 hr 180 NRe 400/Auto 2.0 72.5 kg 2.0 K/2.25 Ca  Heparin: 2000 units IV TIW Calcitriol: 2 mcg PO TIW  BMD meds:  Renvela 800 mg 3 tabs PO TID AC and 2 tabs PO w/snack Sensipar 90 mg PO q PM  Last labs: Ca 8.1 Phos 6.7 PTH 2190 HGB 11.1  Assessment/Plan: 1.  Abdominal pain: No acute abnormalities on CT of abd/pelvis. PPI, reglan and dilaudid per primary.  2.  Hyperkalemia: K+ 5.7 on admission, down to 4.4 after kayexalate then up to > 7.5 while in hospital. HD this AM. K+ 4.7. This is puzzling how his K+ continues to climb so high, particularly in a controlled setting. Monitor renal function profile.  3. Symptomatic Bradycardia in the setting of hyperkalemia: BB stopped, has been moved to SDU for monitoring. Per primary.  4.  ESRD -  MWF HD today on schedule. Next HD Wednesday.  5.  Hypertension/volume  - HD today Pre wt 78.8 kg Net UF 4175 Post wt 74.6. Still 2.1 above OP EDW. BP high, cont amlodipine 10 mg PO Q HS, Cardura 4 mg Q HS. Will add hydralazine 100 mg PO BID and isosorbide mononitrate 60 mg PO daily per OP med list. 6.  Anemia  - HGB 10.6 No ESA needed 7.  Metabolic bone disease -  Continue binders, VDRA. Phos 6.3 Ca 6.9 C Ca 7.62 Ca too low for sensipar-hold for now.  8.  Nutrition - 3.1. Renal diet/renal vit/nepro.   H. Owens Shark, NP-C 01/06/2017, 1:32 PM  [employer] [phone]

## 2017-01-06 NOTE — Progress Notes (Signed)
   Subjective: Denies chest pain at this time. Abdominal pain is improved with medications. He had symptomatic bradycardia and hyperkalemia overnight which improved with atropine and glucagon. He received two amps of D50 with the insulin overnight of hyperkalemia but glucose this morning was 36 on BMP, he had just began dialysis and reported felling clammy when we arrived to his room and was given an amp of D50.   Objective:  Vital signs in last 24 hours: Vitals:   01/06/17 0700 01/06/17 0725 01/06/17 0732 01/06/17 0745  BP:  (!) 182/112 (!) 187/108 (!) 185/109  Pulse: 77 60 60   Resp: _0 Temp:  97.5 F (36.4 C)    TempSrc:  Oral    SpO2: 96% 93%    Weight:  173 lb 11.6 oz (78.8 kg)    Height:       Physical Exam  Constitutional: He is oriented to person, place, and time. He appears well-developed and well-nourished. No distress.  On dialysis   HENT:  Head: Normocephalic and atraumatic.  Eyes: Conjunctivae are normal. Right eye exhibits no discharge. Left eye exhibits no discharge. No scleral icterus.  Cardiovascular: Regular rhythm and intact distal pulses.  Bradycardia present.  Exam reveals gallop and S4.   No murmur heard. Pulmonary/Chest: Effort normal. No respiratory distress. He has no wheezes.  Bibasilar crackles   Abdominal: Soft. Bowel sounds are normal. He exhibits no distension. There is no tenderness. There is no guarding.  Neurological: He is alert and oriented to person, place, and time.  Skin: Skin is warm and dry. He is not diaphoretic.  Psychiatric: He has a normal mood and affect. His behavior is normal.   Assessment/Plan:    Abdominal pain  GERD (gastroesophageal reflux disease) Abdominal pain is responding well to medications over the last day.  - continue renal diet  - continue protonix BID, Reglan q8h PRN, and diladid q4h PRN    Essential hypertension - continue home meds amlodipine 10 mg, doxazosin 4 mg qHS     ESRD on hemodialysis (HCC)  Hyperkalemia  Overnight he had K>7.5 which improved to 4.7 after kayexalate, calcium gluconate, insulin, and albuterol nebs. -Dialysis today   Symptomatic bradycardia  He had symptomatic bradycardia overnight, HR 57 this morning, improved with resuscitation with glucagon and atropine overnight. On admission he had reported that he was taking carvedilol, this was restarted yesterday but when he was evaluated during the bradycardia he mentioned that this medication was discontinued a year ago.  - Discontinued metoprolol     Bilateral low back pain without sciatica - Continue cyclobenzaprine 10 mg TID PRN  -continue gabapentin 100 mg TID   Dispo: Anticipated discharge in approximately 1-2 day(s).   Frank Noss, MD 01/06/2017, 7:54 AM Pager: [phone]

## 2017-01-06 NOTE — Progress Notes (Signed)
While reviewing discharge instructions with patient he became light headed and "flu like " symptoms.  Dr Hetty Ely called with order to get an early dinner to see if patient feels better after.

## 2017-01-06 NOTE — Progress Notes (Signed)
Received return call from Dr Hetty Ely to give patient another  Dextrose 50 IV for CBG 47 and repeat CBG in 15 mins,

## 2017-01-06 NOTE — Care Management Note (Addendum)
Case Management Note  Patient Details  Name: Frank [name] MRN: [id number] Date of Birth: Sep 12, 1965  Subjective/Objective:   From home alone, pta indep, HD patient (M,W, F).  He does not have a PCP, NCM asked if he would like to be a new patient at the [facility], he states no, he is going to get a MD in [place] because he does his dialysis in [place].  He has [employer].   PCP - New patient apt with Nimish Gosarini in [place] 6/26 at 11;30                 Action/Plan: NCM will follow for dc needs.   Expected Discharge Date:  01/06/17               Expected Discharge Plan:  Home/Self Care  In-House Referral:     Discharge planning Services  CM Consult  Post Acute Care Choice:    Choice offered to:     DME Arranged:    DME Agency:     HH Arranged:    HH Agency:     Status of Service:  Completed, signed off  If discussed at [employer] of Stay Meetings, dates discussed:    Additional Comments:  Zenon Mayo, RN 01/06/2017, 2:25 PM

## 2017-01-06 NOTE — Progress Notes (Signed)
2020: Pt called complaining of pain, arrived in room with dilaudid but pt was not complain of abd pain but chest pain. Telemetry was reading in the 70s for HR but on assessment the actual rhythm was appearing more bradycardic. Radial pulse was checked and was very faint at 34 BPM. Vitals were taken, BP was 79/39 and EKG attempted.   2030: Rapid Response nurse called and arrived within the minute to assess situation.   Dr. Olevia Bowens arrived after being paged followed by pt's primary team, Internal medicine. Orders placed and pt symptoms improved after glucagon and atropine administrated. Pt's VSS with BP at 135/90 and HR=60s. Pt was transferred to PCU.

## 2017-01-06 NOTE — Progress Notes (Signed)
Paged by RN for HR 30-120s on monitor. Repeat EKG now with junctional bradycardia, widened QRS, and peaked T waves. HR of 30 on tracing. Patient is asymptomatic. S/p kayexalate @ 23:28 for potassium of 6.1. Ordered STAT renal function panel, calcium gluconate, insulin, & D50. Will follow up labs and continue to monitor closely.  Velna Ochs, M.D. Pager: [phone] 01/06/2017, 2:03 AM

## 2017-01-06 NOTE — Procedures (Signed)
I have seen and examined this patient and agree with the plan of care   Patient was seen on dialysis  Developed acute hyperkalemia during evening with bradycardia 20   K > 7.5  Treated medical   Lua Feng W 01/06/2017, 8:39 AM

## 2017-01-06 NOTE — Progress Notes (Signed)
Subjective: Pt. Developed unstable bradycardia with HR in 20s at around 11 pm, atropine and glucagon given, carvedilol D/c, and HR increased to 60s. Transferred to PCU. Hyperkalemia managed throughout night with kayexalate, insulin, dextrose, calcium gluconate. K+ as high as >7.5 with ECG changes of peaked T waves, widened QRS, junctional bradycardia. This AM patient was in process of receiving HD. Pt. Denies chest pain, endorses minor diffuse abdominal pain, no other complaints. Pt. Says he ate solid food yesterday evening, but vomiting some. Feels good overall, ate some fruit this morning without nausea or vomiting. Pt. Expresses readiness to go home to day if possible.   Objective: Vital signs in last 24 hours: Vitals:   01/06/17 0427 01/06/17 0500 01/06/17 0600 01/06/17 0700  BP:  (!) 148/98    Pulse:  (!) 59 (!) 58 77  Resp:  _0 Temp:      TempSrc:      SpO2: 98% 93% 92% 96%  Weight:      Height:        Physical Exam: Gen: Well appearing, NAD CV: Rate around 60, regular rhythm. No rubs or gallops. 2+ pulses throughout Pulm: bibasilar crackles, otherwise CTAB Abdomen: normoactive bowel sounds, mildly distended. Mildly tender to palpation, diffusely  Lab Results: Basic Metabolic Panel:  Recent Labs Lab 01/05/17 2116 01/06/17 0141 01/06/17 0357  NA 134* 133*  --   K 6.1* >7.5* 6.4*  CL 96* 93*  --   CO2 23 27  --   GLUCOSE 122* 102*  --   BUN 52* 58*  --   CREATININE 13.96* 15.08*  --   CALCIUM 6.5* 6.7*  --   MG 1.7  --   --   PHOS  --  6.5*  --    CBC:  Recent Labs Lab 01/04/17 2206 01/05/17 0301  WBC 3.8* 4.5  HGB 10.8* 10.2*  HCT 34.8* 33.0*  MCV 84.3 84.2  PLT 162 141*   Cardiac Enzymes:  Recent Labs Lab 01/05/17 2116  TROPONINI <0.03   CBG:  Recent Labs Lab 01/05/17 0459 01/05/17 0820 01/05/17 1220 01/05/17 1732 01/05/17 2116 01/05/17 2231  GLUCAP 73 72 85 87 128* 146*    Medications:  Scheduled Meds: . amLODipine  10 mg  Oral QHS  . cinacalcet  90 mg Oral Q supper  . doxazosin  4 mg Oral QHS  . FLUoxetine  40 mg Oral q morning - 10a  . gabapentin  100 mg Oral TID  . heparin  5,000 Units Subcutaneous Q8H  . lidocaine  15 mL Mouth/Throat Once  . pantoprazole  40 mg Oral BID AC  . sevelamer carbonate  1,600 mg Oral Q2000  . sevelamer carbonate  2,400 mg Oral TID WC  . sodium chloride flush  3 mL Intravenous Q12H   Continuous Infusions: PRN Meds:.cyclobenzaprine, HYDROmorphone, metoCLOPramide Assessment/Plan: Principal Problem:   Abdominal pain Active Problems:   ESRD on hemodialysis (HCC)   Essential hypertension   Bilateral low back pain without sciatica   Hyperkalemia   GERD (gastroesophageal reflux disease)   Aortic atherosclerosis [facility])  Mr. Aspinall is a 51 y/o man with ESRD on HD, CHF, and HTN being managed for nausea, vomiting with abdominal pain in setting of missed HD and hyperkalemia.  Nausea/Vomiting/Ab. Pain: Improved since admission. The pt. Has been advanced from NPO on admission to solid food diet. He is now eating solid food without nausea/vomiting. Most likely was secondary to gastroenteritis and/or uremia. Pt. Has hx. Of GERD and  has GI appointment set.   -Encourage HD compliance -GI f/u appointment already in place for chronic GERD workup -home metoclopramide 5 mg Q8H PRN  -home pantoprazole 40 mg BID before meals  Hyperkalemia: Hyperkalemia managed throughout night as described in Subjective section. Now WNL and stable.  -HD this AM  ESRD: On HD MWF.   -home cinacalcet 90 mg QD -home sevelamer 2,400 mg TID -HD as above -Note that during 10/30/2015 hospital course left renal mass noted concerning for RCC, radical nephrectomy recommended by urology consult at time  GERD: Stable. GI appointment later this month.   -pantoprazole 40 mg  -GI appointment  HTN: 150-170s/90-100s. Hx. Of chronic HTN. Stable  -home amlodipine 10 mg QHS -home doxazosin 51m QHS  HF: Pt.  Stable, not concerned for acute decompensation. Carvedilol d/c last night due to unstable bradycardia in setting of severe hyperkalemia. Pt. Now says he has not taken carvedilol for the last year. 2017 echo shows LVEF 35-50%, severely dilated L. Atrium, dilated R. Atrium, PA peak pressure 52 mm Hg. Pt. HDS at discharge.  -D/c without carvedilol -Outpt. Cardiology f/u   Adjustment order with mixed anxiety and depressed mood: Stable.  -home fluoxetine 40 mg BID   Chronic pain: Stable.  -home gabapentin 100 mg TID -home tramadol 50 mg Q12H PRN -hydromorphone 4 mg Q4H PRN   Dispo: D/C    This is a MCareers information officerNote.  The care of the patient was discussed with Dr. BHetty Elyand the assessment and plan formulated with their assistance.  Please see their attached note for official documentation of the daily encounter.   LOS: 0 days   FJillyn Hidden Medical Student 01/06/2017, 7:26 AM

## 2017-01-06 NOTE — Discharge Instructions (Signed)
[name], Frank [name] glad your abdominal pain has improved we believe this may be related to some inflammation in your stomach,  Resume taking Protonix twice daily   START taking renvela 800 mg three times daily   Please resume your regular dialysis schedule and schedule an appointment to see your primary care doctor within the next week.

## 2017-01-06 NOTE — Progress Notes (Signed)
Dr Hetty Ely called informed CBG 196, 8sec run of Vtach while giving D50 and continuous BP 170's/103's.  No additional orders received.

## 2017-01-06 NOTE — Progress Notes (Signed)
Patient states he can not swallow well and would like to speak with Dr Hetty Ely.  Call placed to Dr Hetty Ely and she will come by to see patient.

## 2017-01-06 NOTE — Progress Notes (Signed)
Shift Note:  Patient K+ 6.1 upon last lab results arriving to the floor alert and oriented to person, place, time and situation (A&Ox4).  He complained of abdominal intermittently during the night but denies chest pain.  Pain controlled with as needed medications at this time see MAR.     Patient heart ranged from 35-125's and heart rhythm from A-fib, Junctional Bradycardia to Sinus Tach.  He consistently had PVC's, pauses greater than 4 seconds at times with some missed beats.  He recheck of his K+ >7.5.    IM[facility] MD's contacted throughout the night and responded with several orders as well as talking to the patient on the floor.  The patient did receive chemical dialysis on the floor twice.  They will continue to monitor and dialysis being performed on the floor.  Patient is a hard stick and first IV infiltrated at this time only has one access.

## 2017-01-07 LAB — GLUCOSE, CAPILLARY
GLUCOSE-CAPILLARY: 79 mg/dL (ref 65–99)
Glucose-Capillary: 93 mg/dL (ref 65–99)

## 2017-01-07 MED ORDER — ISOSORBIDE MONONITRATE ER 60 MG PO TB24: 60.0000 mg | ORAL_TABLET | Freq: Every day | ORAL | 0 refills | Status: DC

## 2017-01-07 MED ORDER — HYDROMORPHONE HCL 4 MG PO TABS: 2.0000 mg | ORAL_TABLET | Freq: Four times a day (QID) | ORAL | 0 refills | Status: DC | PRN

## 2017-01-07 MED ORDER — HYDRALAZINE HCL 100 MG PO TABS: 100.0000 mg | ORAL_TABLET | Freq: Two times a day (BID) | ORAL | 0 refills | Status: DC

## 2017-01-07 NOTE — Progress Notes (Signed)
Received patient transferred from [place]. Patient AOx4, ambulatory with standby assist, VS stable, pain at 3/10 and O2Sat at 99% on RA.  Patient on contact precaution for MRSA by PCR.  MRSA protocol started.  Will monitor.

## 2017-01-07 NOTE — Progress Notes (Signed)
Subjective: Pt. Was ready for discharge yesterday after dialysis, with no acute complaints. Then developed light-headedness and nausea. Remained in hospital ON, with sxs. Resolving by morning rounds with scheduled medications. Pt. Complains of distended abdomen, as well as discomfort extending from stomach up esophagus, but otherwise asymptomatic. Pt ate solid food breakfast w/out issue. Pt says he is ready to go home.  Objective: Vital signs in last 24 hours: Vitals:   01/06/17 2300 01/06/17 2335 01/07/17 0043 01/07/17 0549  BP:  (!) 149/82 (!) 141/75 (!) 159/92  Pulse:  78 73 69  Resp: _0 Temp:  98.1 F (36.7 C) 98.1 F (36.7 C) 98.1 F (36.7 C)  TempSrc:  Oral Oral Oral  SpO2:  100% 99% 98%  Weight:   74.8 kg (165 lb)   Height:   _1  (1.88 m)     Physical exam: Gen: well appearing, NAD CV: RRR, possible S3 Pulm: CTAB Abdomen: mildy distended, mildly and diffusely tender to palpation Extremities: 2+ pulses, warm and well perfused  Lab Results: Basic Metabolic Panel:  Recent Labs Lab 01/05/17 2116 01/06/17 0141 01/06/17 0357 01/06/17 0726  NA 134* 133*  --  135  K 6.1* >7.5* 6.4* 4.7  CL 96* 93*  --  96*  CO2 23 27  --  24  GLUCOSE 122* 102*  --  36*  BUN 52* 58*  --  59*  CREATININE 13.96* 15.08*  --  15.19*  CALCIUM 6.5* 6.7*  --  6.9*  MG 1.7  --   --   --   PHOS  --  6.5*  --  6.3*   CBC:  Recent Labs Lab 01/05/17 0301 01/06/17 0726  WBC 4.5 5.4  HGB 10.2* 10.6*  HCT 33.0* 32.6*  MCV 84.2 82.1  PLT 141* 139*   CBG:  Recent Labs Lab 01/06/17 1116 01/06/17 1215 01/06/17 1305 01/06/17 1644 01/06/17 2106 01/07/17 0721  GLUCAP 43* 55* 196* 108* 152* 12    Micro Results: Recent Results (from the past 240 hour(s))  MRSA PCR Screening     Status: Abnormal   Collection Time: 01/06/17  6:52 PM  Result Value Ref Range Status   MRSA by PCR POSITIVE (A) NEGATIVE Final    Comment:        The GeneXpert MRSA Assay (FDA approved for  NASAL specimens only), is one component of a comprehensive MRSA colonization surveillance program. It is not intended to diagnose MRSA infection nor to guide or monitor treatment for MRSA infections. RESULT CALLED TO, READ BACK BY AND VERIFIED WITH: Windell Hummingbird RN 2118 01/06/17 A BROWNING    Medications:  Scheduled Meds: . amLODipine  10 mg Oral QHS  . Chlorhexidine Gluconate Cloth  6 each Topical Q0600  . doxazosin  4 mg Oral QHS  . FLUoxetine  40 mg Oral q morning - 10a  . gabapentin  100 mg Oral TID  . heparin  2,000 Units Dialysis Once in dialysis  . heparin  5,000 Units Subcutaneous Q8H  . hydrALAZINE  100 mg Oral BID  . isosorbide mononitrate  60 mg Oral Daily  . lidocaine  15 mL Mouth/Throat Once  . mupirocin ointment  1 application Nasal BID  . pantoprazole  40 mg Oral BID AC  . sevelamer carbonate  1,600 mg Oral Q2000  . sevelamer carbonate  2,400 mg Oral TID WC  . sodium chloride flush  3 mL Intravenous Q12H   Continuous Infusions: . sodium chloride    .  sodium chloride     PRN Meds:.sodium chloride, sodium chloride, alteplase, cyclobenzaprine, heparin, HYDROmorphone, lidocaine (PF), lidocaine-prilocaine, metoCLOPramide, pentafluoroprop-tetrafluoroeth Assessment/Plan: Principal Problem:   Abdominal pain Active Problems:   ESRD on hemodialysis (HCC)   Essential hypertension   Bilateral low back pain without sciatica   Hyperkalemia   GERD (gastroesophageal reflux disease)   Aortic atherosclerosis [facility])   Frank [name] is a 51 y/o man with ESRD on HD, CHF, and HTN being managed for nausea, vomiting with abdominal pain in setting of missed HD and hyperkalemia.  Nausea/Vomiting/Ab. Pain: Developed some nausea yesterday after dialysis, no vomiting. No nausea this AM, pt. Feels like it resolved and is ready to go home. Reports solid bowel movement yesterday.  -Encourage HD compliance -GI f/u appointment already in place for chronic GERD workup -home metoclopramide 5  mg Q8H PRN  -home pantoprazole 40 mg BID before meals  ESRD: On HD MWF. HD yesterday, nausea afterwards, resolved with home meds and bed rest.   -home cinacalcet 90 mg QD -home sevelamer 2,400 mg TID -HD as above -Note that during 10/30/2015 hospital course left renal mass noted concerning for RCC, radical nephrectomy recommended by urology consult at time   HTN:150-170s/90-100s. Hx. Of chronic HTN. Stable  -home amlodipine 10 mg QHS -home doxazosin 45m QHS -isosorbid mononitrate 24 hr tablet 60 mg  GERD: Complained of stomach and esophageal discomfort this AM consistent with hx. Of GERD..[facility] KitchenGI appointment later this month.  -pantoprazole 40 mg  -GI appointment  HF: Pt. Stable, not concerned for acute decompensation. Carvedilol d/c during hospital stay due to unstable bradycardia in setting of severe hyperkalemia. Pt. Now says he has not taken carvedilol for the last year. 2017 echo shows LVEF 35-50%, severely dilated L. Atrium, dilated R. Atrium, PA peak pressure 52 mm Hg. Pt. HDS at discharge.  -D/c without carvedilol -Outpt. Cardiology f/u   Adjustment order with mixed anxiety and depressed mood: Stable.  -home fluoxetine 40 mg BID   Chronic pain: Stable. Pt. Requesting Rx. For discharge. Will need to check database for past opiate scripts. Pt. Says he does not have pain contract yet with pain clinic. Pt. Says take hydromorphone at home, which is from the last hospital visit.  -home gabapentin 100 mg TID -home tramadol 50 mg Q12H PRN -hydromorphone 4 mg Q4H PRN---->pending hx. Of opiate scripts/pain clinic agreement.   This is a MCareers information officerNote.  The care of the patient was discussed with Dr. BHetty Elyand the assessment and plan formulated with their assistance.  Please see their attached note for official documentation of the daily encounter.   LOS: 1 day   FJillyn Hidden Medical Student 01/07/2017, 10:51 AM

## 2017-01-07 NOTE — Progress Notes (Signed)
Discharge paperwork reviewed with patient. No questions verbalized. Prescription given. Patient is ready for discharge.

## 2017-01-07 NOTE — Progress Notes (Signed)
Internal Medicine Attending:   I saw and examined the patient. I reviewed the resident's note and I agree with the resident's findings and plan as documented in the resident's note. Abd pain improved this morning, reports he is ready to go home but requests short supply of pain medication to get him to an appointment with his pain clinic.  Abd non tender on exam, otherwise he is in no distress, will plan to discharge home today, will have dialysis tomorrow at his outpatient facility,  Follow up with GI as discussed previously.

## 2017-01-07 NOTE — Progress Notes (Signed)
[facility] Progress Note   Subjective:  Feels better today Abdominal pain improving K 4.7  Says he's going home today   Objective Vitals:   01/06/17 2300 01/06/17 2335 01/07/17 0043 01/07/17 0549  BP:  (!) 149/82 (!) 141/75 (!) 159/92  Pulse:  78 73 69  Resp: _0 Temp:  98.1 F (36.7 C) 98.1 F (36.7 C) 98.1 F (36.7 C)  TempSrc:  Oral Oral Oral  SpO2:  100% 99% 98%  Weight:   74.8 kg (165 lb)   Height:   _1  (1.88 m)    Physical Exam General: WNWD AAM NAD  Heart: RRR  Lungs: CTAB  Abdomen: BS+ soft mildly tender to palpation  Extremities: No Le edmea  Dialysis Access: LUE AVF +bruit   Dialysis Orders:  [facility] MWF 4 hr 180 NRe 400/Auto 2.0 72.5 kg 2.0 K/2.25 Ca  Heparin: 2000 units IV TIW Calcitriol: 2 mcg PO TIW  BMD meds:  Renvela 800 mg 3 tabs PO TID AC and 2 tabs PO w/snack Sensipar 90 mg PO q PM   Assessment/Plan: 1.  Abdominal pain: No acute abnormalities on CT of abd/pelvis. PPI, reglan and dilaudid per primary. Improved today 2.  Hyperkalemia: K+ 5.7 on admission, down to 4.4 after kayexalate then up to > 7.5 while in hospital. HD this AM. K+ 4.7. This is puzzling how his K+ continues to climb so high, particularly in a controlled setting. ? Recirculation from access. Reported Low AF as outpt - will plan outpatient fistulogram  3. Symptomatic Bradycardia in the setting of hyperkalemia: BB stopped, has been moved to SDU for monitoring. Per primary.  4.  ESRD -  MWF Next HD Wednesday.  5.  Hypertension/volume  - HD today Pre wt 78.8 kg Net UF 4175 Post wt 74.6. Still 2.1 above OP EDW. BP high, cont amlodipine 10 mg PO Q HS, Cardura 4 mg Q HS. hydralazine 100 mg PO BID and isosorbide mononitrate 60 mg PO daily per OP med list. 6.  Anemia  - HGB 10.6 No ESA needed 7.  Metabolic bone disease -  Continue binders, VDRA. Phos 6.3 Ca 6.9 C Ca 7.62 Ca too low for sensipar-hold for now.  8.  Nutrition - 3.1. Renal diet/renal  vit/  Lynnda Child PA-C [facility] Pager [phone] 01/07/2017,11:22 AM  LOS: 1 day   Additional Objective Labs: Basic Metabolic Panel:  Recent Labs Lab 01/05/17 2116 01/06/17 0141 01/06/17 0357 01/06/17 0726  NA 134* 133*  --  135  K 6.1* >7.5* 6.4* 4.7  CL 96* 93*  --  96*  CO2 23 27  --  24  GLUCOSE 122* 102*  --  36*  BUN 52* 58*  --  59*  CREATININE 13.96* 15.08*  --  15.19*  CALCIUM 6.5* 6.7*  --  6.9*  PHOS  --  6.5*  --  6.3*   Liver Function Tests:  Recent Labs Lab 01/04/17 2058 01/06/17 0141 01/06/17 0726  AST 19  --   --   ALT 8*  --   --   ALKPHOS 164*  --   --   BILITOT 0.7  --   --   PROT 7.8  --   --   ALBUMIN 3.5 3.2* 3.1*    Recent Labs Lab 01/04/17 2058  LIPASE 25   CBC:  Recent Labs Lab 01/04/17 2206 01/05/17 0301 01/06/17 0726  WBC 3.8* 4.5 5.4  HGB 10.8* 10.2* 10.6*  HCT 34.8* 33.0* 32.6*  MCV 84.3 84.2 82.1  PLT 162 141* 139*   Blood Culture    Component Value Date/Time   SDES NASAL SWAB 08/18/2015 1800   SPECREQUEST NONE 08/18/2015 1800   CULT  08/18/2015 1800    NORMAL NASOPHARYNGEAL FLORA Performed at [place] 08/21/2015 FINAL 08/18/2015 1800    Cardiac Enzymes:  Recent Labs Lab 01/05/17 2116  TROPONINI <0.03   CBG:  Recent Labs Lab 01/06/17 1215 01/06/17 1305 01/06/17 1644 01/06/17 2106 01/07/17 0721  GLUCAP 55* 196* 108* 152* 79   Iron Studies: No results for input(s): IRON, TIBC, TRANSFERRIN, FERRITIN in the last 72 hours. Lab Results  Component Value Date   INR 1.14 10/05/2016   INR 1.12 08/05/2016   INR 1.09 08/04/2016   Medications: . sodium chloride    . sodium chloride     . amLODipine  10 mg Oral QHS  . Chlorhexidine Gluconate Cloth  6 each Topical Q0600  . doxazosin  4 mg Oral QHS  . FLUoxetine  40 mg Oral q morning - 10a  . gabapentin  100 mg Oral TID  . heparin  2,000 Units Dialysis Once in dialysis  . heparin  5,000 Units  Subcutaneous Q8H  . hydrALAZINE  100 mg Oral BID  . isosorbide mononitrate  60 mg Oral Daily  . lidocaine  15 mL Mouth/Throat Once  . mupirocin ointment  1 application Nasal BID  . pantoprazole  40 mg Oral BID AC  . sevelamer carbonate  1,600 mg Oral Q2000  . sevelamer carbonate  2,400 mg Oral TID WC  . sodium chloride flush  3 mL Intravenous Q12H

## 2017-01-07 NOTE — Progress Notes (Signed)
   Subjective: Mr. Boulden began feeling unwell yesterday as he was preparing for discharge, he described symptoms of nausea and difficulty swallowing. Today he says those symptoms improved with Dilaudid and Protonix overnight. He denies chest pain or difficulty breathing at this time and was able to complete a full breakfast this morning. He says the symptoms that he was feeling were difficult to describe. His nurse noted that the symptoms began after he demanded to be discharged with a pain medication prescription. Today he says he has been walking around in his room and feels ready to go home once he is given a prescription. He says " I always gets a couple of pain pills when I leave the hospital" and insist that he does not follow with a pain med clinic.    Objective:  Vital signs in last 24 hours: Vitals:   01/06/17 2300 01/06/17 2335 01/07/17 0043 01/07/17 0549  BP:  (!) 149/82 (!) 141/75 (!) 159/92  Pulse:  78 73 69  Resp: _0 Temp:  98.1 F (36.7 C) 98.1 F (36.7 C) 98.1 F (36.7 C)  TempSrc:  Oral Oral Oral  SpO2:  100% 99% 98%  Weight:   165 lb (74.8 kg)   Height:   _1  (1.88 m)    Physical Exam  Constitutional: He is oriented to person, place, and time. He appears well-developed and well-nourished. No distress.  Sitting up in bed watching TV   HENT:  Head: Normocephalic and atraumatic.  Eyes: Conjunctivae are normal. Right eye exhibits no discharge. Left eye exhibits no discharge. No scleral icterus.  Cardiovascular: Regular rhythm and intact distal pulses.  Bradycardia present.   No murmur heard. Pulmonary/Chest: Effort normal. No respiratory distress. He has no wheezes.  Abdominal: Soft. Bowel sounds are normal. He exhibits distension. There is no tenderness. There is no guarding.  Neurological: He is alert and oriented to person, place, and time.  Skin: Skin is warm and dry. He is not diaphoretic.  Psychiatric: He has a normal mood and affect. His behavior is  normal.   Assessment/Plan:    Abdominal pain  GERD (gastroesophageal reflux disease) Abdominal pain is resolved today.   - continue renal diet  - continue protonix BID and Reglan q8h PRN    Essential hypertension - continue home meds amlodipine 10 mg, doxazosin 4 mg qHS  -nephrology has started hydralizine 200 mg BID and isosorbide mononitrate 60 mg daily as recorded in his med list     ESRD on hemodialysis ([facility])   Hyperkalemia  Resolved  Symptomatic bradycardia  Resolved    Bilateral low back pain without sciatica - Continue cyclobenzaprine 10 mg TID PRN  -continue gabapentin 100 mg TID   Dispo: Anticipated discharge today  Ledell Noss, MD 01/07/2017, 11:02 AM Pager: [phone]

## 2017-01-08 DIAGNOSIS — N186 End stage renal disease: Secondary | ICD-10-CM | POA: Diagnosis not present

## 2017-01-08 DIAGNOSIS — D631 Anemia in chronic kidney disease: Secondary | ICD-10-CM | POA: Diagnosis not present

## 2017-01-08 DIAGNOSIS — N2581 Secondary hyperparathyroidism of renal origin: Secondary | ICD-10-CM | POA: Diagnosis not present

## 2017-01-10 DIAGNOSIS — N2581 Secondary hyperparathyroidism of renal origin: Secondary | ICD-10-CM | POA: Diagnosis not present

## 2017-01-10 DIAGNOSIS — D631 Anemia in chronic kidney disease: Secondary | ICD-10-CM | POA: Diagnosis not present

## 2017-01-10 DIAGNOSIS — N186 End stage renal disease: Secondary | ICD-10-CM | POA: Diagnosis not present

## 2017-01-11 ENCOUNTER — Emergency Department (HOSPITAL_COMMUNITY): Payer: Medicare Other

## 2017-01-11 ENCOUNTER — Encounter (HOSPITAL_COMMUNITY): Payer: Self-pay

## 2017-01-11 DIAGNOSIS — R109 Unspecified abdominal pain: Secondary | ICD-10-CM | POA: Insufficient documentation

## 2017-01-11 DIAGNOSIS — F419 Anxiety disorder, unspecified: Secondary | ICD-10-CM | POA: Diagnosis not present

## 2017-01-11 DIAGNOSIS — Z79899 Other long term (current) drug therapy: Secondary | ICD-10-CM | POA: Insufficient documentation

## 2017-01-11 DIAGNOSIS — Z9115 Patient's noncompliance with renal dialysis: Secondary | ICD-10-CM | POA: Insufficient documentation

## 2017-01-11 DIAGNOSIS — K219 Gastro-esophageal reflux disease without esophagitis: Secondary | ICD-10-CM | POA: Insufficient documentation

## 2017-01-11 DIAGNOSIS — D631 Anemia in chronic kidney disease: Secondary | ICD-10-CM | POA: Insufficient documentation

## 2017-01-11 DIAGNOSIS — J810 Acute pulmonary edema: Secondary | ICD-10-CM | POA: Insufficient documentation

## 2017-01-11 DIAGNOSIS — N186 End stage renal disease: Secondary | ICD-10-CM | POA: Insufficient documentation

## 2017-01-11 DIAGNOSIS — N2581 Secondary hyperparathyroidism of renal origin: Secondary | ICD-10-CM | POA: Insufficient documentation

## 2017-01-11 DIAGNOSIS — R079 Chest pain, unspecified: Principal | ICD-10-CM | POA: Insufficient documentation

## 2017-01-11 DIAGNOSIS — Z87891 Personal history of nicotine dependence: Secondary | ICD-10-CM | POA: Diagnosis not present

## 2017-01-11 DIAGNOSIS — Z992 Dependence on renal dialysis: Secondary | ICD-10-CM | POA: Diagnosis not present

## 2017-01-11 DIAGNOSIS — Z86718 Personal history of other venous thrombosis and embolism: Secondary | ICD-10-CM | POA: Diagnosis not present

## 2017-01-11 DIAGNOSIS — E8770 Fluid overload, unspecified: Secondary | ICD-10-CM | POA: Diagnosis not present

## 2017-01-11 DIAGNOSIS — I120 Hypertensive chronic kidney disease with stage 5 chronic kidney disease or end stage renal disease: Secondary | ICD-10-CM | POA: Diagnosis not present

## 2017-01-11 DIAGNOSIS — R0789 Other chest pain: Secondary | ICD-10-CM | POA: Diagnosis not present

## 2017-01-11 DIAGNOSIS — I428 Other cardiomyopathies: Secondary | ICD-10-CM | POA: Diagnosis not present

## 2017-01-11 LAB — CBC
HEMATOCRIT: 36.5 % — AB (ref 39.0–52.0)
HEMOGLOBIN: 11.8 g/dL — AB (ref 13.0–17.0)
MCH: 27.3 pg (ref 26.0–34.0)
MCHC: 32.3 g/dL (ref 30.0–36.0)
MCV: 84.3 fL (ref 78.0–100.0)
Platelets: 121 10*3/uL — ABNORMAL LOW (ref 150–400)
RBC: 4.33 MIL/uL (ref 4.22–5.81)
RDW: 18.4 % — ABNORMAL HIGH (ref 11.5–15.5)
WBC: 4.1 10*3/uL (ref 4.0–10.5)

## 2017-01-11 NOTE — ED Triage Notes (Signed)
Pt complaining of abdominal pain and chest pain. Pt states dry cough. Pt states N/V, no diarrhea. Pt states 3 episodes of emesis today. Pt denies any urinary symptoms.

## 2017-01-11 NOTE — ED Notes (Signed)
Patient transported to X-ray 

## 2017-01-12 ENCOUNTER — Observation Stay (HOSPITAL_COMMUNITY)
Admission: EM | Admit: 2017-01-12 | Discharge: 2017-01-14 | Disposition: A | Discharge status: Home / Self Care | Payer: Medicare Other | Attending: Internal Medicine | Admitting: Internal Medicine

## 2017-01-12 DIAGNOSIS — N186 End stage renal disease: Secondary | ICD-10-CM | POA: Diagnosis not present

## 2017-01-12 DIAGNOSIS — R079 Chest pain, unspecified: Secondary | ICD-10-CM | POA: Diagnosis not present

## 2017-01-12 DIAGNOSIS — R112 Nausea with vomiting, unspecified: Secondary | ICD-10-CM | POA: Diagnosis not present

## 2017-01-12 DIAGNOSIS — E1165 Type 2 diabetes mellitus with hyperglycemia: Secondary | ICD-10-CM | POA: Diagnosis not present

## 2017-01-12 DIAGNOSIS — Z992 Dependence on renal dialysis: Secondary | ICD-10-CM | POA: Diagnosis not present

## 2017-01-12 DIAGNOSIS — E1129 Type 2 diabetes mellitus with other diabetic kidney complication: Secondary | ICD-10-CM | POA: Diagnosis present

## 2017-01-12 DIAGNOSIS — I120 Hypertensive chronic kidney disease with stage 5 chronic kidney disease or end stage renal disease: Secondary | ICD-10-CM

## 2017-01-12 DIAGNOSIS — E1159 Type 2 diabetes mellitus with other circulatory complications: Secondary | ICD-10-CM | POA: Diagnosis present

## 2017-01-12 DIAGNOSIS — E1122 Type 2 diabetes mellitus with diabetic chronic kidney disease: Secondary | ICD-10-CM | POA: Diagnosis not present

## 2017-01-12 DIAGNOSIS — IMO0002 Reserved for concepts with insufficient information to code with codable children: Secondary | ICD-10-CM | POA: Diagnosis present

## 2017-01-12 DIAGNOSIS — J810 Acute pulmonary edema: Secondary | ICD-10-CM

## 2017-01-12 DIAGNOSIS — D631 Anemia in chronic kidney disease: Secondary | ICD-10-CM | POA: Diagnosis not present

## 2017-01-12 DIAGNOSIS — N2581 Secondary hyperparathyroidism of renal origin: Secondary | ICD-10-CM | POA: Diagnosis not present

## 2017-01-12 DIAGNOSIS — E8770 Fluid overload, unspecified: Secondary | ICD-10-CM

## 2017-01-12 DIAGNOSIS — I152 Hypertension secondary to endocrine disorders: Secondary | ICD-10-CM | POA: Diagnosis present

## 2017-01-12 DIAGNOSIS — I10 Essential (primary) hypertension: Secondary | ICD-10-CM | POA: Diagnosis present

## 2017-01-12 DIAGNOSIS — R1013 Epigastric pain: Secondary | ICD-10-CM | POA: Diagnosis present

## 2017-01-12 LAB — I-STAT TROPONIN, ED: Troponin i, poc: 0.02 ng/mL (ref 0.00–0.08)

## 2017-01-12 LAB — CBC
HCT: 33.9 % — ABNORMAL LOW (ref 39.0–52.0)
Hemoglobin: 10.9 g/dL — ABNORMAL LOW (ref 13.0–17.0)
MCH: 27.5 pg (ref 26.0–34.0)
MCHC: 32.2 g/dL (ref 30.0–36.0)
MCV: 85.4 fL (ref 78.0–100.0)
Platelets: 128 K/uL — ABNORMAL LOW (ref 150–400)
RBC: 3.97 MIL/uL — ABNORMAL LOW (ref 4.22–5.81)
RDW: 18.4 % — ABNORMAL HIGH (ref 11.5–15.5)
WBC: 3.8 K/uL — ABNORMAL LOW (ref 4.0–10.5)

## 2017-01-12 LAB — COMPREHENSIVE METABOLIC PANEL
ALBUMIN: 3.4 g/dL — AB (ref 3.5–5.0)
ALK PHOS: 135 U/L — AB (ref 38–126)
ALT: 9 U/L — ABNORMAL LOW (ref 17–63)
ANION GAP: 15 (ref 5–15)
AST: 19 U/L (ref 15–41)
BUN: 36 mg/dL — ABNORMAL HIGH (ref 6–20)
CALCIUM: 7.8 mg/dL — AB (ref 8.9–10.3)
CO2: 25 mmol/L (ref 22–32)
Chloride: 96 mmol/L — ABNORMAL LOW (ref 101–111)
Creatinine, Ser: 10.17 mg/dL — ABNORMAL HIGH (ref 0.61–1.24)
GFR calc Af Amer: 6 mL/min — ABNORMAL LOW (ref >60)
GFR calc non Af Amer: 5 mL/min — ABNORMAL LOW (ref >60)
GLUCOSE: 72 mg/dL (ref 65–99)
POTASSIUM: 4.6 mmol/L (ref 3.5–5.1)
SODIUM: 136 mmol/L (ref 135–145)
Total Bilirubin: 0.8 mg/dL (ref 0.3–1.2)
Total Protein: 7.3 g/dL (ref 6.5–8.1)

## 2017-01-12 LAB — RENAL FUNCTION PANEL
Albumin: 3 g/dL — ABNORMAL LOW (ref 3.5–5.0)
Anion gap: 12 (ref 5–15)
BUN: 39 mg/dL — ABNORMAL HIGH (ref 6–20)
CO2: 30 mmol/L (ref 22–32)
Calcium: 8 mg/dL — ABNORMAL LOW (ref 8.9–10.3)
Chloride: 99 mmol/L — ABNORMAL LOW (ref 101–111)
Creatinine, Ser: 10.79 mg/dL — ABNORMAL HIGH (ref 0.61–1.24)
GFR calc Af Amer: 6 mL/min — ABNORMAL LOW (ref >60)
GFR calc non Af Amer: 5 mL/min — ABNORMAL LOW (ref >60)
Glucose, Bld: 73 mg/dL (ref 65–99)
Phosphorus: 5.5 mg/dL — ABNORMAL HIGH (ref 2.5–4.6)
Potassium: 4.6 mmol/L (ref 3.5–5.1)
Sodium: 141 mmol/L (ref 135–145)

## 2017-01-12 LAB — TROPONIN I
Troponin I: <0.03 ng/mL (ref <0.03)
Troponin I: 0.03 ng/mL (ref <0.03)
Troponin I: <0.03 ng/mL (ref <0.03)

## 2017-01-12 LAB — LIPASE, BLOOD: Lipase: 25 U/L (ref 11–51)

## 2017-01-12 MED ORDER — AMLODIPINE BESYLATE 10 MG PO TABS
10.0000 mg | ORAL_TABLET | Freq: Every day | ORAL | Status: DC
  Administered 2017-01-13: 10 mg via ORAL
  Filled 2017-01-12: qty 1

## 2017-01-12 MED ORDER — DICLOFENAC SODIUM 1 % TD GEL
2.0000 g | Freq: Four times a day (QID) | TRANSDERMAL | Status: DC
  Administered 2017-01-12 – 2017-01-14 (×2): 2 g via TOPICAL
  Filled 2017-01-12: qty 100

## 2017-01-12 MED ORDER — HYDROMORPHONE HCL 2 MG PO TABS
2.0000 mg | ORAL_TABLET | Freq: Two times a day (BID) | ORAL | Status: DC | PRN
Start: 2017-01-12 — End: 2017-01-14
  Administered 2017-01-12 – 2017-01-14 (×4): 2 mg via ORAL
  Filled 2017-01-12 (×3): qty 1

## 2017-01-12 MED ORDER — HYDRALAZINE HCL 20 MG/ML IJ SOLN
10.0000 mg | Freq: Once | INTRAMUSCULAR | Status: AC
Start: 2017-01-12 — End: 2017-01-12
  Administered 2017-01-12: 10 mg via INTRAVENOUS
  Filled 2017-01-12: qty 1

## 2017-01-12 MED ORDER — LIDOCAINE HCL (PF) 1 % IJ SOLN: 5.0000 mL | INTRAMUSCULAR | Status: DC | PRN

## 2017-01-12 MED ORDER — SENNOSIDES-DOCUSATE SODIUM 8.6-50 MG PO TABS
2.0000 | ORAL_TABLET | Freq: Two times a day (BID) | ORAL | Status: DC
  Administered 2017-01-12 – 2017-01-14 (×2): 2 via ORAL
  Filled 2017-01-12 (×3): qty 2

## 2017-01-12 MED ORDER — HYDRALAZINE HCL 50 MG PO TABS
100.0000 mg | ORAL_TABLET | Freq: Two times a day (BID) | ORAL | Status: DC
  Administered 2017-01-13 – 2017-01-14 (×2): 100 mg via ORAL
  Filled 2017-01-12 (×2): qty 2

## 2017-01-12 MED ORDER — ISOSORBIDE MONONITRATE ER 60 MG PO TB24
60.0000 mg | ORAL_TABLET | Freq: Every day | ORAL | Status: DC
  Administered 2017-01-12 – 2017-01-14 (×2): 60 mg via ORAL
  Filled 2017-01-12 (×2): qty 1

## 2017-01-12 MED ORDER — HEPARIN SODIUM (PORCINE) 5000 UNIT/ML IJ SOLN
5000.0000 [IU] | Freq: Three times a day (TID) | INTRAMUSCULAR | Status: DC
  Filled 2017-01-12 (×3): qty 1

## 2017-01-12 MED ORDER — PROMETHAZINE HCL 25 MG PO TABS: 12.5000 mg | ORAL_TABLET | Freq: Four times a day (QID) | ORAL | Status: DC | PRN

## 2017-01-12 MED ORDER — CALCITRIOL 0.5 MCG PO CAPS: 3.5000 ug | ORAL_CAPSULE | ORAL | Status: DC

## 2017-01-12 MED ORDER — HYDROMORPHONE HCL 1 MG/ML IJ SOLN
1.0000 mg | Freq: Once | INTRAMUSCULAR | Status: AC
  Administered 2017-01-12: 1 mg via INTRAVENOUS
  Filled 2017-01-12: qty 1

## 2017-01-12 MED ORDER — FLUOXETINE HCL 20 MG PO CAPS
40.0000 mg | ORAL_CAPSULE | Freq: Every morning | ORAL | Status: DC
  Administered 2017-01-12 – 2017-01-14 (×3): 40 mg via ORAL
  Filled 2017-01-12 (×4): qty 2

## 2017-01-12 MED ORDER — MUPIROCIN 2 % EX OINT
1.0000 "application " | TOPICAL_OINTMENT | Freq: Two times a day (BID) | CUTANEOUS | Status: DC
  Administered 2017-01-14: 1 via NASAL
  Filled 2017-01-12 (×2): qty 22

## 2017-01-12 MED ORDER — PRO-STAT SUGAR FREE PO LIQD
30.0000 mL | Freq: Two times a day (BID) | ORAL | Status: DC
  Administered 2017-01-13 – 2017-01-14 (×2): 30 mL via ORAL
  Filled 2017-01-12 (×2): qty 30

## 2017-01-12 MED ORDER — SODIUM CHLORIDE 0.9 % IV SOLN: 100.0000 mL | INTRAVENOUS | Status: DC | PRN

## 2017-01-12 MED ORDER — GABAPENTIN 100 MG PO CAPS
100.0000 mg | ORAL_CAPSULE | Freq: Three times a day (TID) | ORAL | Status: DC
  Administered 2017-01-12 – 2017-01-14 (×7): 100 mg via ORAL
  Filled 2017-01-12 (×7): qty 1

## 2017-01-12 MED ORDER — PENTAFLUOROPROP-TETRAFLUOROETH EX AERO: 1.0000 "application " | INHALATION_SPRAY | CUTANEOUS | Status: DC | PRN

## 2017-01-12 MED ORDER — LIDOCAINE-PRILOCAINE 2.5-2.5 % EX CREA: 1.0000 "application " | TOPICAL_CREAM | CUTANEOUS | Status: DC | PRN

## 2017-01-12 MED ORDER — HEPARIN SODIUM (PORCINE) 1000 UNIT/ML DIALYSIS: 20.0000 [IU]/kg | INTRAMUSCULAR | Status: DC | PRN

## 2017-01-12 MED ORDER — GI COCKTAIL ~~LOC~~
30.0000 mL | Freq: Three times a day (TID) | ORAL | Status: DC | PRN
  Filled 2017-01-12 (×2): qty 30

## 2017-01-12 MED ORDER — ONDANSETRON HCL 4 MG/2ML IJ SOLN
4.0000 mg | Freq: Once | INTRAMUSCULAR | Status: AC
  Administered 2017-01-12: 4 mg via INTRAVENOUS
  Filled 2017-01-12: qty 2

## 2017-01-12 MED ORDER — DOXAZOSIN MESYLATE 2 MG PO TABS
4.0000 mg | ORAL_TABLET | Freq: Every day | ORAL | Status: DC
  Administered 2017-01-13: 4 mg via ORAL
  Filled 2017-01-12: qty 1
  Filled 2017-01-12: qty 2

## 2017-01-12 MED ORDER — CLONIDINE HCL 0.3 MG/24HR TD PTWK: 0.3000 mg | MEDICATED_PATCH | TRANSDERMAL | Status: DC

## 2017-01-12 MED ORDER — TRAMADOL HCL 50 MG PO TABS
50.0000 mg | ORAL_TABLET | Freq: Two times a day (BID) | ORAL | Status: DC | PRN
  Administered 2017-01-12 – 2017-01-14 (×3): 50 mg via ORAL
  Filled 2017-01-12 (×3): qty 1

## 2017-01-12 MED ORDER — TRAMADOL HCL 50 MG PO TABS
50.0000 mg | ORAL_TABLET | Freq: Once | ORAL | Status: AC
  Administered 2017-01-12: 50 mg via ORAL
  Filled 2017-01-12: qty 1

## 2017-01-12 MED ORDER — HEPARIN SODIUM (PORCINE) 1000 UNIT/ML DIALYSIS: 1000.0000 [IU] | INTRAMUSCULAR | Status: DC | PRN

## 2017-01-12 MED ORDER — SEVELAMER CARBONATE 800 MG PO TABS
800.0000 mg | ORAL_TABLET | Freq: Three times a day (TID) | ORAL | Status: DC
  Administered 2017-01-12 – 2017-01-14 (×5): 800 mg via ORAL
  Filled 2017-01-12 (×7): qty 1

## 2017-01-12 MED ORDER — METOCLOPRAMIDE HCL 5 MG PO TABS
5.0000 mg | ORAL_TABLET | Freq: Three times a day (TID) | ORAL | Status: DC | PRN
  Administered 2017-01-13: 5 mg via ORAL
  Filled 2017-01-12: qty 1

## 2017-01-12 MED ORDER — CHLORHEXIDINE GLUCONATE CLOTH 2 % EX PADS: 6.0000 | MEDICATED_PAD | Freq: Every day | CUTANEOUS | Status: DC

## 2017-01-12 MED ORDER — PANTOPRAZOLE SODIUM 40 MG PO TBEC
40.0000 mg | DELAYED_RELEASE_TABLET | Freq: Two times a day (BID) | ORAL | Status: DC
  Administered 2017-01-12 – 2017-01-14 (×5): 40 mg via ORAL
  Filled 2017-01-12 (×5): qty 1

## 2017-01-12 NOTE — ED Notes (Signed)
Paged admitting due to pt having active CP. Awaiting new orders.

## 2017-01-12 NOTE — Progress Notes (Signed)
Pt states chest pain is 9/10 and is wanting something stronger than Tramadol. RN made MD aware. Awaiting response.   Eleanora Neighbor, RN

## 2017-01-12 NOTE — ED Notes (Addendum)
Spoke w/ Admitting physician who stated that pt pain is musculoskeletal in nature and nothing could be done to bring it down. Stated pt still appropriate for Tele floor.

## 2017-01-12 NOTE — Progress Notes (Signed)
CRITICAL VALUE ALERT  Critical Value:  Troponin = 0.03  Date & Time Notied:  01-12-2017, 12:42  Provider Notified: Internal Medicine Teaching Service  Orders Received/Actions taken: Hemodialysis already ordered

## 2017-01-12 NOTE — ED Notes (Signed)
Pt denying any change in pain. Admitting provider paged.

## 2017-01-12 NOTE — Progress Notes (Signed)
Patient arrived to unit per bed.  Reviewed treatment plan and this RN agrees.  Report received from bedside RN, Otila Kluver.  Consent obtained.  Patient A & O X 4. Lung sounds diminished to ausculation in all fields. Generalized non pititng edema. Cardiac: NSR, 1st degree HB.  Prepped LLAVF with alcohol and cannulated with two 15 gauge needles.  Pulsation of blood noted.  Flushed access well with saline per protocol.  Connected and secured lines and initiated tx at 2136.  UF goal of 3000 mL and net fluid removal of 2500 mL.  Will continue to monitor.

## 2017-01-12 NOTE — Progress Notes (Signed)
New Admission Note:  Arrival Method: By bed from ED around 0630 Mental Orientation: Alert and oriented Telemetry: Box 29, CCMD notified Assessment: Completed IV: Right upper arm S.L. Pain: Chest pain 9/10, MD aware Tubes: None Safety Measures: Safety Fall Prevention Plan was given, discussed. Admission: Completed [address] Orientation: Patient has been orientated to the room, unit and the staff. Family: None  Orders have been reviewed and implemented. Will continue to monitor the patient. Call light has been placed within reach.  Perry Mount, RN  Phone Number: [phone]

## 2017-01-12 NOTE — Consult Note (Signed)
[facility] Renal Consultation Note    Indication for Consultation:  Management of ESRD/hemodialysis, anemia, hypertension/volume, and secondary hyperparathyroidism.  HPI: Frank [name] is a 51 y.o. male with ESRD, HTN, combined HF (LVEF 35-40%), Hx non-compliance who was admitted with chest pain and volume overload.  Reports that developed R sided chest pain, worse with inspiration, after last dialysis on Friday 6/22. Also, was having abdominal pain with N/V without diarrhea. No fever, chills. No dizziness or headache. Pain became unbearable which prompted ED eval on 6/23. In ED, found to have uncontrolled BP. Labs stable (no hyperkalemia). Troponin negative. EKG showing NSR with LAFB. CXR with + pulm edema.  From renal standpoint, usually dialyzes MWF at [facility]. Last HD on 6/22, did stay his entire 4 hours. Left slightly above EDW at 73.9kg.  Past Medical History:  Diagnosis Date  . Anemia   . Anxiety   . Chronic combined systolic and diastolic CHF (congestive heart failure) (HCC)    a. EF 20-25% by echo in 08/2015 b. echo 10/2015: EF 35-40%, diffuse HK, severe LAE, moderate RAE, small pericardial effusion  . Complication of anesthesia    itching, sore throat  . Depression   . Dialysis patient ([place])   . ESRD (end stage renal disease) ([place])    due to HTN per patient, followed at [facility], s/p failed kidney transplant - dialysis Tue, Th, Sat  . Hyperkalemia 12/2015  . Hypertension   . Junctional rhythm    a. noted in 08/2015: hyperkalemic at that time  b. 12/2015: presented in junctional rhythm w/ K+ of 6.6. Resolved with improvement of K+ levels.  . Nonischemic cardiomyopathy ([place])    a. 08/2014: cath showing minimal CAD, but tortuous arteries noted.   . Personal history of DVT (deep vein thrombosis)/ PE 05/26/2016   In Oct 2015 had small subsemental LUL PE w/o DVT (LE dopplers neg) and was felt to be HD cath related, treated w coumadin.  IN May 2016 had small vein  DVT (acute/subacute) in the R basilic/ brachial veins of the RUE, resumed on coumadin.  Had R sided HD cath at that time.    . Renal insufficiency   . Shortness of breath   . Type II diabetes mellitus (HCC)    No history per patient, but remains under history as A1c would not be accurate given on dialysis   Past Surgical History:  Procedure Laterality Date  . CAPD INSERTION    . CAPD REMOVAL    . INGUINAL HERNIA REPAIR Right 02/14/2015   Procedure: REPAIR INCARCERATED RIGHT INGUINAL HERNIA;  Surgeon: [name], MD;  Location: [place];  Service: General;  Laterality: Right;  . INSERTION OF DIALYSIS CATHETER Right 09/23/2015   Procedure: exchange of Right internal Dialysis Catheter.;  Surgeon: [name], MD;  Location: [place];  Service: Vascular;  Laterality: Right;  . IR GENERIC HISTORICAL  07/16/2016   IR US GUIDE VASC ACCESS LEFT 07/16/2016 Corrie Mckusick, DO [facility]-INTER[facility]  . IR GENERIC HISTORICAL Left 07/16/2016   IR THROMBECTOMY AV FISTULA W/THROMBOLYSIS/PTA INC/SHUNT/IMG LEFT 07/16/2016 Corrie Mckusick, DO [facility]  . KIDNEY RECEIPIENT  2006   failed and started HD in March 2014  . LEFT HEART CATHETERIZATION WITH CORONARY ANGIOGRAM N/A 09/02/2014   Procedure: LEFT HEART CATHETERIZATION WITH CORONARY ANGIOGRAM;  Surgeon: [name], MD;  Location: [facility] C[facility] LAB;  Service: Cardiovascular;  Laterality: N/A;   Family History  Problem Relation Age of Onset  . Hypertension Other  Social History:  reports that he has quit smoking. His smoking use included Cigarettes. He smoked 0.00 packs per day for 1.00 year. He has never used smokeless tobacco. He reports that he does not drink alcohol or use drugs.  ROS: As per HPI otherwise negative.  Physical Exam: Vitals:   01/12/17 0430 01/12/17 0500 01/12/17 0545 01/12/17 0600  BP: (!) 153/90 (!) 160/98 (!) 171/95 (!) 167/96  Pulse: 75 76 73 74  Resp: 11 (!) 22    Temp:      TempSrc:      SpO2: 97% 100% 100% 100%  Weight:       Height:         General: Well developed, well nourished, in no acute distress. Head: Normocephalic, atraumatic, sclera non-icteric, mucus membranes are moist. Neck: Supple without lymphadenopathy/masses. Lungs: Clear bilaterally to auscultation without wheezes, rales, or rhonchi. Breathing is unlabored. Heart: RRR with normal S1, S2. No murmurs, rubs, or gallops appreciated. Abdomen: soft, tender to palpation. Musculoskeletal:  Strength and tone appear normal for age. Lower extremities: No LE edema or ischemic changes, no open wounds. Neuro: Alert and oriented X 3. Moves all extremities spontaneously. Psych:  Responds to questions appropriately with a normal affect. Dialysis Access: AVF forearm + thrill/bruit  Allergies  Allergen Reactions  . Butalbital-Apap-Caffeine Shortness Of Breath, Swelling and Other (See Comments)    Swelling in throat  . Ferrlecit [Na Ferric Gluc Cplx In Sucrose] Shortness Of Breath, Swelling and Other (See Comments)    Swelling in throat, tolerates Venofor  . Minoxidil Shortness Of Breath  . Tylenol [Acetaminophen] Anaphylaxis and Swelling  . Darvocet [Propoxyphene N-Acetaminophen] Hives   Prior to Admission medications   Medication Sig Start Date End Date Taking? Authorizing Provider  amLODipine (NORVASC) 10 MG tablet Take 1 tablet (10 mg total) by mouth at bedtime. 12/01/16  Yes Rice, Resa Miner, MD  cloNIDine (CATAPRES - DOSED IN MG/24 HR) 0.3 mg/24hr patch Place onto the skin once a week. On Wednesday 12/31/16  Yes [provider]  doxazosin (CARDURA) 4 MG tablet Take 4 mg by mouth at bedtime. 11/26/16  Yes [provider]  FLUoxetine (PROZAC) 40 MG capsule Take 40 mg by mouth every morning. 11/26/16  Yes [provider]  gabapentin (NEURONTIN) 100 MG capsule Take 100 mg by mouth 3 (three) times daily. 12/31/16  Yes [provider]  hydrALAZINE (APRESOLINE) 100 MG tablet Take 1 tablet (100 mg total) by mouth 2 (two) times  daily. 01/07/17  Yes Ledell Noss, MD  HYDROmorphone (DILAUDID) 4 MG tablet Take 0.5 tablets (2 mg total) by mouth every 6 (six) hours as needed for moderate pain. 01/07/17 01/12/17 Yes Ledell Noss, MD  isosorbide mononitrate (IMDUR) 60 MG 24 hr tablet Take 1 tablet (60 mg total) by mouth daily. 01/08/17  Yes Ledell Noss, MD  metoCLOPramide (REGLAN) 5 MG tablet Take 1 tablet (5 mg total) by mouth every 8 (eight) hours as needed for nausea. 12/01/16  Yes Rice, Resa Miner, MD  pantoprazole (PROTONIX) 40 MG tablet Take 1 tablet (40 mg total) by mouth 2 (two) times daily before a meal. 12/01/16  Yes Rice, Resa Miner, MD  sevelamer carbonate (RENVELA) 800 MG tablet Take 1 tablet (800 mg total) by mouth 3 (three) times daily with meals. 01/06/17  Yes Ledell Noss, MD  traMADol (ULTRAM) 50 MG tablet Take 1 tablet (50 mg total) by mouth every 12 (twelve) hours as needed for moderate pain or severe pain. 12/01/16  Yes Rice,  Resa Miner, MD   Current Facility-Administered Medications  Medication Dose Route Frequency Provider Last Rate Last Dose  . 0.9 %  sodium chloride infusion  100 mL Intravenous PRN Ejigiri, Thomos Lemons, PA-C      . 0.9 %  sodium chloride infusion  100 mL Intravenous PRN Ejigiri, Thomos Lemons, PA-C      . amLODipine (NORVASC) tablet 10 mg  10 mg Oral QHS Shela Leff, MD      . Derrill Memo ON 01/15/2017] cloNIDine (CATAPRES - Dosed in mg/24 hr) patch 0.3 mg  0.3 mg Transdermal Weekly Shela Leff, MD      . diclofenac sodium (VOLTAREN) 1 % transdermal gel 2 g  2 g Topical QID Shela Leff, MD   2 g at 01/12/17 0850  . doxazosin (CARDURA) tablet 4 mg  4 mg Oral QHS Shela Leff, MD      . FLUoxetine (PROZAC) capsule 40 mg  40 mg Oral q morning - 10a Shela Leff, MD      . gabapentin (NEURONTIN) capsule 100 mg  100 mg Oral TID Shela Leff, MD   100 mg at 01/12/17 0849  . gi cocktail (Maalox,Lidocaine,Donnatal)  30 mL Oral TID PRN Shela Leff, MD      .  heparin injection 1,000 Units  1,000 Units Dialysis PRN Lynnda Child, PA-C      . heparin injection 1,400 Units  20 Units/kg Dialysis PRN Lynnda Child, PA-C      . heparin injection 5,000 Units  5,000 Units Subcutaneous Q8H Shela Leff, MD      . hydrALAZINE (APRESOLINE) tablet 100 mg  100 mg Oral BID Shela Leff, MD      . isosorbide mononitrate (IMDUR) 24 hr tablet 60 mg  60 mg Oral Daily Shela Leff, MD      . lidocaine (PF) (XYLOCAINE) 1 % injection 5 mL  5 mL Intradermal PRN Lynnda Child, PA-C      . lidocaine-prilocaine (EMLA) cream 1 application  1 application Topical PRN Ejigiri, Thomos Lemons, PA-C      . metoCLOPramide (REGLAN) tablet 5 mg  5 mg Oral Q8H PRN Shela Leff, MD      . pantoprazole (PROTONIX) EC tablet 40 mg  40 mg Oral BID AC Shela Leff, MD   40 mg at 01/12/17 0849  . pentafluoroprop-tetrafluoroeth (GEBAUERS) aerosol 1 application  1 application Topical PRN Ejigiri, Thomos Lemons, PA-C      . senna-docusate (Senokot-S) tablet 2 tablet  2 tablet Oral BID Shela Leff, MD      . sevelamer carbonate (RENVELA) tablet 800 mg  800 mg Oral TID WC Shela Leff, MD   800 mg at 01/12/17 0849  . traMADol (ULTRAM) tablet 50 mg  50 mg Oral Q12H PRN Shela Leff, MD       Labs: Basic Metabolic Panel:  Recent Labs Lab 01/06/17 0141  01/06/17 0726 01/11/17 2258 01/12/17 0652  NA 133*  --  135 136 141  K >7.5*  < > 4.7 4.6 4.6  CL 93*  --  96* 96* 99*  CO2 27  --  _0 GLUCOSE 102*  --  36* 72 73  BUN 58*  --  59* 36* 39*  CREATININE 15.08*  --  15.19* 10.17* 10.79*  CALCIUM 6.7*  --  6.9* 7.8* 8.0*  PHOS 6.5*  --  6.3*  --  5.5*  < > = values in this interval not displayed. Liver Function Tests:  Recent Labs Lab  01/06/17 0726 01/11/17 2258 01/12/17 0652  AST  --  19  --   ALT  --  9*  --   ALKPHOS  --  135*  --   BILITOT  --  0.8  --   PROT  --  7.3  --   ALBUMIN 3.1* 3.4* 3.0*     Recent Labs Lab 01/11/17 2258  LIPASE 25   CBC:  Recent Labs Lab 01/06/17 0726 01/11/17 2258 01/12/17 0652  WBC 5.4 4.1 3.8*  HGB 10.6* 11.8* 10.9*  HCT 32.6* 36.5* 33.9*  MCV 82.1 84.3 85.4  PLT 139* 121* 128*   Cardiac Enzymes:  Recent Labs Lab 01/05/17 2116 01/12/17 0044 01/12/17 0652  TROPONINI <0.03 <0.03 <0.03   CBG:  Recent Labs Lab 01/06/17 1305 01/06/17 1644 01/06/17 2106 01/07/17 0721 01/07/17 1204  GLUCAP 196* 108* 152* 79 93   Studies/Results: Dg Chest 2 View  Result Date: 01/12/2017 CLINICAL DATA:  Chest pain.  Weakness for 1 day. EXAM: CHEST  2 VIEW COMPARISON:  Radiograph 11/29/2016 FINDINGS: Stable cardiomegaly and mediastinal contours. Again seen vascular congestion. Mild increase in pulmonary edema. Trace fluid in the right minor fissure and blunting of the both costophrenic angles, small pleural effusions. No confluent airspace disease. No pneumothorax. IMPRESSION: Increased pulmonary edema, small pleural effusions. Stable cardiomegaly. Electronically Signed   By: Jeb Levering M.D.   On: 01/12/2017 00:06   Dialysis Orders:  MWF at [facility] 4hr, BFR 400, DFR800, 2K/2.25Ca, EDW 72.5kg, heparin 2K bolus - Mircera 156mg q 2 weeks ordered (last given 2044m on 12/18/16) - Calcitriol 3.34m23mPO TIW ( just increased from 2mc29mor PTH 1714 on 6/20) - Venofer 100mg49m0 ordered (1 given outpt for tsat 29% on 6/20)  Assessment/Plan: 1.  Pulmonary edema (on CXR): For HD today. 2.  Chest pain (MSK v. related to pulmonary edema): as above, for HD 6/24. 3.  Abdominal pain/nausea & vomiting: Per primary. 4.  ESRD: HD today, then back to MWF schedule. Using AVF, 2K bath. 5.  Hypertension/volume: BP high with volume excess, 2.5L UF ordered. Continue home meds. 6.  Anemia: Hgb 10.9. Hold off on starting ESA for now. 7.  Metabolic bone disease: Corr Ca/Phos ok. Continue Renvela as binder, will resume Calcitrol for 6/25 HD. 8.  Nutrition:  Alb 3,  adding Nepro supps.  KatieVeneta PentonC 01/12/2017, 10:35 AM  [facility] Pager: [phone]

## 2017-01-12 NOTE — ED Provider Notes (Signed)
[facility] DEPT Provider Note   CSN: [id number] Arrival date & time: 01/11/17  2245     History   Chief Complaint Chief Complaint  Patient presents with  . Chest Pain  . Abdominal Pain    HPI Frank [name] is a 51 y.o. male.  Patient with a history of ESRD-HD, HTN, HLD, CHF, nonischemic CM, T2DM presents with complaint of severe abdominal pain with nausea and vomiting, and right sided chest pain associated with SOB.  No fever or cough. He reports last admission was 6/16 to 6/18 for evaluation of abdominal pain and it started 2-3 days after he was released. SOB and CP is worsening over time. He reports compliance with his dialysis treatments since discharge from the hospital. He also states he has been able to take his blood pressure medications but not his pain medication due to nausea/vomiting.   The history is provided by the patient. No language interpreter was used.  Chest Pain   Associated symptoms include abdominal pain, nausea, shortness of breath, vomiting and weakness. Pertinent negatives include no cough and no fever.  Abdominal Pain   Associated symptoms include nausea and vomiting. Pertinent negatives include fever and diarrhea.    Past Medical History:  Diagnosis Date  . Anemia   . Anxiety   . Chronic combined systolic and diastolic CHF (congestive heart failure) (HCC)    a. EF 20-25% by echo in 08/2015 b. echo 10/2015: EF 35-40%, diffuse HK, severe LAE, moderate RAE, small pericardial effusion  . Complication of anesthesia    itching, sore throat  . Depression   . Dialysis patient ([place])   . ESRD (end stage renal disease) ([place])    due to HTN per patient, followed at [facility], s/p failed kidney transplant - dialysis Tue, Th, Sat  . Hyperkalemia 12/2015  . Hypertension   . Junctional rhythm    a. noted in 08/2015: hyperkalemic at that time  b. 12/2015: presented in junctional rhythm w/ K+ of 6.6. Resolved with improvement of K+ levels.  . Nonischemic  cardiomyopathy ([place])    a. 08/2014: cath showing minimal CAD, but tortuous arteries noted.   . Personal history of DVT (deep vein thrombosis)/ PE 05/26/2016   In Oct 2015 had small subsemental LUL PE w/o DVT (LE dopplers neg) and was felt to be HD cath related, treated w coumadin.  IN May 2016 had small vein DVT (acute/subacute) in the R basilic/ brachial veins of the RUE, resumed on coumadin.  Had R sided HD cath at that time.    . Renal insufficiency   . Shortness of breath   . Type II diabetes mellitus (HCC)    No history per patient, but remains under history as A1c would not be accurate given on dialysis    Patient Active Problem List   Diagnosis Date Noted  . Aortic atherosclerosis ([place]) 01/05/2017  . Abdominal pain 08/04/2016  . Uremia 06/07/2016  . Hyperkalemia 05/29/2016  . GERD (gastroesophageal reflux disease) 05/29/2016  . Personal history of DVT (deep vein thrombosis)/ PE 05/26/2016  . Nonischemic cardiomyopathy ([place]) 01/09/2016  . Bilateral low back pain without sciatica   . Renal cyst, left 10/30/2015  . Constipation by delayed colonic transit 10/30/2015  . Acute on chronic combined systolic and diastolic congestive heart failure ([place]) 09/23/2015  . Adjustment disorder with mixed anxiety and depressed mood 08/20/2015  . Essential hypertension 01/02/2015  . Dyslipidemia   . Malignant hypertension 11/29/2014  . ESRD on hemodialysis ([place])   .  DM (diabetes mellitus), type 2, uncontrolled, with renal complications ([place])   . Complex sleep apnea syndrome 05/05/2014  . Anemia of chronic kidney failure 06/24/2013    Past Surgical History:  Procedure Laterality Date  . CAPD INSERTION    . CAPD REMOVAL    . INGUINAL HERNIA REPAIR Right 02/14/2015   Procedure: REPAIR INCARCERATED RIGHT INGUINAL HERNIA;  Surgeon: [name], MD;  Location: [place];  Service: General;  Laterality: Right;  . INSERTION OF DIALYSIS CATHETER Right 09/23/2015   Procedure: exchange of Right internal  Dialysis Catheter.;  Surgeon: [name], MD;  Location: [place];  Service: Vascular;  Laterality: Right;  . IR GENERIC HISTORICAL  07/16/2016   IR US GUIDE VASC ACCESS LEFT 07/16/2016 Corrie Mckusick, DO [facility]  . IR GENERIC HISTORICAL Left 07/16/2016   IR THROMBECTOMY AV FISTULA W/THROMBOLYSIS/PTA INC/SHUNT/IMG LEFT 07/16/2016 Corrie Mckusick, DO [facility]  . KIDNEY RECEIPIENT  2006   failed and started HD in March 2014  . LEFT HEART CATHETERIZATION WITH CORONARY ANGIOGRAM N/A 09/02/2014   Procedure: LEFT HEART CATHETERIZATION WITH CORONARY ANGIOGRAM;  Surgeon: [name], MD;  Location: [facility] CATH LAB;  Service: Cardiovascular;  Laterality: N/A;       Home Medications    Prior to Admission medications   Medication Sig Start Date End Date Taking? Authorizing Provider  amLODipine (NORVASC) 10 MG tablet Take 1 tablet (10 mg total) by mouth at bedtime. 12/01/16   Rice, Resa Miner, MD  cloNIDine (CATAPRES - DOSED IN MG/24 HR) 0.3 mg/24hr patch Place onto the skin once a week. On Wednesday 12/31/16   [provider]  doxazosin (CARDURA) 4 MG tablet Take 4 mg by mouth at bedtime. 11/26/16   [provider]  FLUoxetine (PROZAC) 40 MG capsule Take 40 mg by mouth every morning. 11/26/16   [provider]  gabapentin (NEURONTIN) 100 MG capsule Take 100 mg by mouth 3 (three) times daily. 12/31/16   [provider]  hydrALAZINE (APRESOLINE) 100 MG tablet Take 1 tablet (100 mg total) by mouth 2 (two) times daily. 01/07/17   Ledell Noss, MD  HYDROmorphone (DILAUDID) 4 MG tablet Take 4 mg by mouth every 4 (four) hours as needed for moderate pain.  12/31/16   [provider]  HYDROmorphone (DILAUDID) 4 MG tablet Take 0.5 tablets (2 mg total) by mouth every 6 (six) hours as needed for moderate pain. 01/07/17 01/12/17  Ledell Noss, MD  isosorbide mononitrate (IMDUR) 60 MG 24 hr tablet Take 1 tablet (60 mg total) by mouth daily. 01/08/17   Ledell Noss, MD    metoCLOPramide (REGLAN) 5 MG tablet Take 1 tablet (5 mg total) by mouth every 8 (eight) hours as needed for nausea. 12/01/16   Rice, Resa Miner, MD  pantoprazole (PROTONIX) 40 MG tablet Take 1 tablet (40 mg total) by mouth 2 (two) times daily before a meal. 12/01/16   Rice, Resa Miner, MD  sevelamer carbonate (RENVELA) 800 MG tablet Take 1 tablet (800 mg total) by mouth 3 (three) times daily with meals. 01/06/17   Ledell Noss, MD  traMADol (ULTRAM) 50 MG tablet Take 1 tablet (50 mg total) by mouth every 12 (twelve) hours as needed for moderate pain or severe pain. Patient not taking: Reported on 01/04/2017 12/01/16   Collier Salina, MD    Family History Family History  Problem Relation Age of Onset  . Hypertension Other     Social History Social History  Substance Use Topics  .  Smoking status: Former Smoker    Packs/day: 0.00    Years: 1.00    Types: Cigarettes  . Smokeless tobacco: Never Used     Comment: quit Jan 2014  . Alcohol use No     Allergies   Butalbital-apap-caffeine; Ferrlecit [na ferric gluc cplx in sucrose]; Minoxidil; Tylenol [acetaminophen]; and Darvocet [propoxyphene n-acetaminophen]   Review of Systems Review of Systems  Constitutional: Positive for appetite change. Negative for chills and fever.  HENT: Negative.   Respiratory: Positive for shortness of breath. Negative for cough.   Cardiovascular: Positive for chest pain.  Gastrointestinal: Positive for abdominal pain, nausea and vomiting. Negative for diarrhea.  Genitourinary:       Patient does not make urine  Musculoskeletal: Negative.   Skin: Negative.   Neurological: Positive for weakness.     Physical Exam Updated Vital Signs BP (!) 176/122   Pulse 63   Temp 98.3 F (36.8 C) (Oral)   Resp 15   Ht _0  (1.88 m)   Wt 74.8 kg (165 lb)   SpO2 100%   BMI 21.18 kg/m   Physical Exam  Constitutional: He is oriented to person, place, and time. He appears well-developed and  well-nourished.  Uncomfortable appearing.  HENT:  Head: Normocephalic.  Neck: Normal range of motion. Neck supple.  Cardiovascular: Normal rate and regular rhythm.   Pulmonary/Chest: Effort normal and breath sounds normal. He has no wheezes. He has no rales. He exhibits tenderness.  Abdominal: Soft. Bowel sounds are normal. He exhibits no distension. There is tenderness (Diffuse abdominal tenderness. ). There is guarding. There is no rebound.  Musculoskeletal: Normal range of motion. He exhibits no edema.  Neurological: He is alert and oriented to person, place, and time.  Skin: Skin is warm and dry. No rash noted.  Psychiatric: He has a normal mood and affect.     ED Treatments / Results  Labs (all labs ordered are listed, but only abnormal results are displayed) Labs Reviewed  COMPREHENSIVE METABOLIC PANEL - Abnormal; Notable for the following:       Result Value   Chloride 96 (*)    BUN 36 (*)    Creatinine, Ser 10.17 (*)    Calcium 7.8 (*)    Albumin 3.4 (*)    ALT 9 (*)    Alkaline Phosphatase 135 (*)    GFR calc non Af Amer 5 (*)    GFR calc Af Amer 6 (*)    All other components within normal limits  CBC - Abnormal; Notable for the following:    Hemoglobin 11.8 (*)    HCT 36.5 (*)    RDW 18.4 (*)    Platelets 121 (*)    All other components within normal limits  LIPASE, BLOOD  TROPONIN I  I-STAT TROPOININ, ED    EKG  EKG Interpretation  Date/Time:  Saturday January 11 2017 22:48:21 EDT Ventricular Rate:  72 PR Interval:  192 QRS Duration: 84 QT Interval:  468 QTC Calculation: 512 R Axis:   -55 Text Interpretation:  Normal sinus rhythm Left anterior fascicular block Nonspecific ST and T wave abnormality Prolonged QT Abnormal ECG No significant change since last tracing Confirmed by Pryor Curia [phone]) on 01/12/2017 2:11:43 AM       Radiology Dg Chest 2 View  Result Date: 01/12/2017 CLINICAL DATA:  Chest pain.  Weakness for 1 day. EXAM: CHEST  2 VIEW  COMPARISON:  Radiograph 11/29/2016 FINDINGS: Stable cardiomegaly and mediastinal contours. Again seen vascular congestion.  Mild increase in pulmonary edema. Trace fluid in the right minor fissure and blunting of the both costophrenic angles, small pleural effusions. No confluent airspace disease. No pneumothorax. IMPRESSION: Increased pulmonary edema, small pleural effusions. Stable cardiomegaly. Electronically Signed   By: Jeb Levering M.D.   On: 01/12/2017 00:06    Procedures Procedures (including critical care time)  Medications Ordered in ED Medications  hydrALAZINE (APRESOLINE) injection 10 mg (not administered)  ondansetron (ZOFRAN) injection 4 mg (not administered)  HYDROmorphone (DILAUDID) injection 1 mg (not administered)     Initial Impression / Assessment and Plan / ED Course  I have reviewed the triage vital signs and the nursing notes.  Pertinent labs & imaging results that were available during my care of the patient were reviewed by me and considered in my medical decision making (see chart for details).     Patient with a history of ESRD-HD presents with CP and SOB for the past several days. No fever. He has nausea vomiting and abdominal pain as well, but his appears to be chronic.   He has an elevated potassium of 5.8. No EKG changes. CXR shows pulmonary edema. No hypoxia.   He will need to be admitted for treatment of pulmonary edema and pain management. He will go to Outpatient Clinic who admitted him less than 4 weeks ago ([place] admission).   Final Clinical Impressions(s) / ED Diagnoses   Final diagnoses:  None   1. Chest pain 2. Pulmonary edema 3. History of ESRD-HD  New Prescriptions New Prescriptions   No medications on file     Charlann Lange, Hershal Coria 01/12/17 0501    Ward, Delice Bison, DO 01/12/17 [phone]

## 2017-01-12 NOTE — H&P (Signed)
Date: 01/12/2017               Patient Name:  Frank [name] MRN: [id number]  DOB: 07/27/1965 Age / Sex: 51 y.o., male   PCP: Patient, [name]         Medical Service: Internal Medicine Teaching Service         Attending Physician: Dr. Leonides Schanz, Delice Bison, DO    First Contact: Dr. Hetty Ely Pager: [phone]  Second Contact: Dr. Juleen China Pager: [phone]       After Hours (After 5p/  First Contact Pager: [phone]  weekends / holidays): Second Contact Pager: [phone]   Chief Complaint: Chest pain  History of Present Illness: Frank [name] is a 51 y.o. gentleman with PMHx significant for ESRD (HD-M,W,F) HTN,FLD, HFpEF, nonischemic cardiomyopathy and type 2 diabetes came to the ED with complaint of right-sided chest pain associated with shortness of breath and abdominal pain along with nausea and vomiting restarted 2-3 days ago. Patient was recently discharged on 01/07/2017, admitted for the same complaints and hyperkalemia.  Patient was complaining of 9 out of 10 right-sided chest pain, nonradiating, increased with deep breathing and any body movements for the last 2 days, chest pain was also associated with shortness of breath, sleeps on 3 pillows for many years no recent change because of his back pain. No PND. He did missed few dialysis before previous hospitalization, since discharge he was compliant with his schedule. His last recorded weight on discharge was similar to today's weight of 165.  Patient was also complaining of abdominal pain associated with nausea and vomiting for last 2 days, he was unable to tolerate meals. Pain is mostly on right upper and lower quadrant. He he denies any diarrhea, fever or chills. He was having similar complaints during previous hospitalization, had his CT abdomen done on 01/05/2017 which was negative for any acute abnormality which can explain patient's symptoms. He does endorse constipation, last bowel movement was 2 days ago, he denies any hematochezia or  melena. Pain has no relationship with food.  An ED. Patient was afebrile, continuously complaining of right-sided chest pain, no relief with Dilaudid, hypertensive, BUN of 36 and creatinine of 10.17, elevated alkaline phosphatase at 135. Chest x-ray shows pulmonary edema with small pleural effusion. He was admitted for chest pain rule out, and treatment of volume overload.  Meds:  No outpatient prescriptions have been marked as taking for the 01/12/17 encounter [facility] Encounter).     Allergies: Allergies as of 01/11/2017 - Review Complete 01/11/2017  Allergen Reaction Noted  . Butalbital-apap-caffeine Shortness Of Breath, Swelling, and Other (See Comments) 08/18/2015  . Ferrlecit [na ferric gluc cplx in sucrose] Shortness Of Breath, Swelling, and Other (See Comments) 06/24/2013  . Minoxidil Shortness Of Breath 08/18/2015  . Tylenol [acetaminophen] Anaphylaxis and Swelling 06/25/2016  . Darvocet [propoxyphene n-acetaminophen] Hives 10/16/2011   Past Medical History:  Diagnosis Date  . Anemia   . Anxiety   . Chronic combined systolic and diastolic CHF (congestive heart failure) (HCC)    a. EF 20-25% by echo in 08/2015 b. echo 10/2015: EF 35-40%, diffuse HK, severe LAE, moderate RAE, small pericardial effusion  . Complication of anesthesia    itching, sore throat  . Depression   . Dialysis patient ([place])   . ESRD (end stage renal disease) ([place])    due to HTN per patient, followed at [facility], s/p failed kidney transplant - dialysis Tue, Th, Sat  . Hyperkalemia 12/2015  .  Hypertension   . Junctional rhythm    a. noted in 08/2015: hyperkalemic at that time  b. 12/2015: presented in junctional rhythm w/ K+ of 6.6. Resolved with improvement of K+ levels.  . Nonischemic cardiomyopathy ([place])    a. 08/2014: cath showing minimal CAD, but tortuous arteries noted.   . Personal history of DVT (deep vein thrombosis)/ PE 05/26/2016   In Oct 2015 had small subsemental LUL PE w/o DVT (LE dopplers  neg) and was felt to be HD cath related, treated w coumadin.  IN May 2016 had small vein DVT (acute/subacute) in the R basilic/ brachial veins of the RUE, resumed on coumadin.  Had R sided HD cath at that time.    . Renal insufficiency   . Shortness of breath   . Type II diabetes mellitus (HCC)    No history per patient, but remains under history as A1c would not be accurate given on dialysis    Family History: Brother had hypertension and ESRD, recently died 3 months ago.  Social History: Quit smoking 25 years ago, denied any alcohol or illicit drug use.  Review of Systems: A complete ROS was negative except as per HPI.   Physical Exam: Blood pressure (!) 177/117, pulse 65, temperature 98.3 F (36.8 C), temperature source Oral, resp. rate 12, height 6' 2" (1.88 m), weight 165 lb (74.8 kg), SpO2 100 %. Vitals:   01/12/17 0145 01/12/17 0200 01/12/17 0215 01/12/17 0230  BP: (!) 176/126 (!) 190/127 (!) 164/100 (!) 177/117  Pulse: (!) 59 66 67 65  Resp: _0 Temp:      TempSrc:      SpO2: 100% 100% 99% 100%  Weight:      Height:       General: Vital signs reviewed.  Patient is well-developed and well-nourished,Appears uncomfortable because of pain, speaking in a very low tone and mumbling ,and cooperative with exam.  Head: Normocephalic and atraumatic. Eyes: EOMI, conjunctivae normal, no scleral icterus.  Neck: Supple, trachea midline, normal ROM, no JVD, masses, thyromegaly, or carotid bruit present.  Cardiovascular: RRR, S1 normal, S2 normal, no murmurs, gallops, or rubs. Pulmonary/Chest: Right chest wall tenderness, with decreased air entry bilaterally because of poor effort. Abdominal: Soft, right upper and lower quadrant tenderness, non-distended, somewhat voluntarily guarding ,BS +. Extremities: No lower extremity edema bilaterally,  pulses symmetric and intact bilaterally. No cyanosis or clubbing. Neurological: A&O x3, Strength is normal and symmetric bilaterally,  cranial nerve II-XII are grossly intact, no focal motor deficit, sensory intact to light touch bilaterally.   Labs. CBC Latest Ref Rng & Units 01/11/2017 01/06/2017 01/05/2017  WBC 4.0 - 10.5 K/uL 4.1 5.4 4.5  Hemoglobin 13.0 - 17.0 g/dL 11.8(L) 10.6(L) 10.2(L)  Hematocrit 39.0 - 52.0 % 36.5(L) 32.6(L) 33.0(L)  Platelets 150 - 400 K/uL 121(L) 139(L) 141(L)   CMP Latest Ref Rng & Units 01/11/2017 01/06/2017 01/06/2017  Glucose 65 - 99 mg/dL 72 36(LL) -  BUN 6 - 20 mg/dL 36(H) 59(H) -  Creatinine 0.61 - 1.24 mg/dL 10.17(H) 15.19(H) -  Sodium 135 - 145 mmol/L 136 135 -  Potassium 3.5 - 5.1 mmol/L 4.6 4.7 6.4(HH)  Chloride 101 - 111 mmol/L 96(L) 96(L) -  CO2 22 - 32 mmol/L 25 24 -  Calcium 8.9 - 10.3 mg/dL 7.8(L) 6.9(L) -  Total Protein 6.5 - 8.1 g/dL 7.3 - -  Total Bilirubin 0.3 - 1.2 mg/dL 0.8 - -  Alkaline Phos 38 - 126 U/L 135(H) - -  AST 15 - 41 U/L 19 - -  ALT 17 - 63 U/L 9(L) - -   Troponin. <0.03 Lipase. 25  EKG: Normal sinus rhythm with prolonged QTc at 512, no acute change.  CXR: FINDINGS: Stable cardiomegaly and mediastinal contours. Again seen vascular congestion. Mild increase in pulmonary edema. Trace fluid in the right minor fissure and blunting of the both costophrenic angles, small pleural effusions. No confluent airspace disease. No pneumothorax.  IMPRESSION: Increased pulmonary edema, small pleural effusions. Stable cardiomegaly.   Assessment & Plan by Problem: ORTON CAPELL is a 51 y.o. gentleman with PMHx significant for ESRD (HD-T.T.S) HTN,FLD, HFpEF, nonischemic cardiomyopathy and type 2 diabetes came to the ED with complaint of right-sided chest pain associated with shortness of breath and abdominal pain along with nausea and vomiting restarted 2-3 days ago. Patient was recently discharged on 01/07/2017, admitted for the same complaints and hyperkalemia.  Chest pain. He is having atypical pain, with negative troponin and no acute change on EKG. Most likely  musculoskeletal, costochondritis can be a possibility. Patient is end-stage renal disease and allergic to Tylenol. He was given Dilaudid in the ED with no relief. Tramadol was also tried it with no relief. Might get benefit with short course of steroid to decreased inflammation. -Admit to step down, as unable to put him on telemetry bed cause of active chest pain. -Trend troponin.  Abdominal pain with nausea and vomiting. He was recently admitted with a similar complaints and CT abdomen done during that visit was without any acute changes explaining his symptoms. -GI cocktail. -Phenergan when necessary for nausea and vomiting. -Senokot for constipation.  ESRD. Electrolytes within normal limits, no indication for emergent dialysis. -Consult nephrology in the morning.  Hypertension. His blood pressure was elevated on presentation, he was given hydralazine 10 mg IV ED, with some improvement in his blood pressure. -Continue home meds of Cardura 4 mg daily, Imdur 60 mg daily, and clonidine patch 0.3 mg per 24-hour and amlodipine 10 mg daily.  CODE STATUS. Full Diet. Renal DVT prophylaxis. Heparin  Dispo: Admit patient to Inpatient with expected length of stay greater than 2 midnights.  SignedLorella Nimrod, MD 01/12/2017, 3:08 AM  Pager: [phone]

## 2017-01-12 NOTE — ED Notes (Signed)
Pt requesting pain medication and water, pt oriented that we need to wait for EDP order for medication and he has to be authorize by EDP to get water, that for now he is nothing by mouth until we have MD orders.

## 2017-01-12 NOTE — Progress Notes (Signed)
   Subjective:  Patient seen and examined.  No acute events since admission.  Complains of chest pain with deep inspiration and palpation.  Abdominal pain improving and reports having a BM yesterday that was normal.  Objective:  Vital signs in last 24 hours: Vitals:   01/12/17 0430 01/12/17 0500 01/12/17 0545 01/12/17 0600  BP: (!) 153/90 (!) 160/98 (!) 171/95 (!) 167/96  Pulse: 75 76 73 74  Resp: 11 (!) 22    Temp:      TempSrc:      SpO2: 97% 100% 100% 100%  Weight:      Height:       General: resting in bed, not in acute distress HEENT: NCAT, EOMI Cardiac: RRR, no rubs, murmurs or gallops.  TTP of chest wall Pulm: normal effort, diminished at bases but otherwise CTAB Abd: soft, nondistended, BS present, mild upper quadrant tenderness Neuro: alert and oriented X3, cranial nerves II-XII grossly intact   Assessment/Plan:  # Chest Pain: atypical chest pain with TTP and worse with deep inspiration.  CXR shows pulmonary edema and small pleural effusions.  Troponin negative x 3.  Likely MSK related vs related to pulmonary edema.  Per nephrology notes, patient did go to HD on 6/22 and stayed for duration of treatment but did leave above EDW. - continue Tramadol Q12H (ESRD dosing recommendations) - continue Voltaren gel - will add oral Dilaudid as he received this last hospitalization to space out tramadol doses - discuss with nephrology of short course NSAIDs may be an option. - HD ordered for today - patient reports being on Dilaudid at home prescribed by [facility].  Per review of [place] Database, patient has been receiving short courses of pain medications intermittently for several months likely from ED visits.  Would not prescribe further opiates at discharge despite patient claim last admission that  "I always gets a couple of pain pills when I leave the hospital"   # Abdominal Pain with N/V: recently admitted for similar complaint last week.  Exam is benign and CT abdomen/pelvis  that admission was negative for acute process. Improving - GI cocktail PRN - Senokot-S for constipation - Reglan PRN - PPI  #ESRD - HD today  # HTN -Continue home meds of Cardura 4 mg daily, Imdur 60 mg daily, and clonidine patch 0.3 mg per 24-hour and amlodipine 10 mg daily.  CODE STATUS. Full Diet. Renal DVT prophylaxis. Heparin  Dispo: Anticipated discharge in approximately 1-2 day(s).   Jule Ser, DO 01/12/2017, 1:00 PM Pager: [phone]

## 2017-01-12 NOTE — Progress Notes (Signed)
MD stated pain is more musculoskeletal and instructed this nurse to put a heating pad with water and gel on pt to see if this helps.   Eleanora Neighbor, RN

## 2017-01-13 DIAGNOSIS — I5042 Chronic combined systolic (congestive) and diastolic (congestive) heart failure: Secondary | ICD-10-CM | POA: Diagnosis not present

## 2017-01-13 DIAGNOSIS — N2581 Secondary hyperparathyroidism of renal origin: Secondary | ICD-10-CM | POA: Diagnosis not present

## 2017-01-13 DIAGNOSIS — I132 Hypertensive heart and chronic kidney disease with heart failure and with stage 5 chronic kidney disease, or end stage renal disease: Secondary | ICD-10-CM | POA: Diagnosis not present

## 2017-01-13 DIAGNOSIS — E11649 Type 2 diabetes mellitus with hypoglycemia without coma: Secondary | ICD-10-CM | POA: Diagnosis present

## 2017-01-13 DIAGNOSIS — J810 Acute pulmonary edema: Secondary | ICD-10-CM

## 2017-01-13 DIAGNOSIS — R112 Nausea with vomiting, unspecified: Secondary | ICD-10-CM | POA: Diagnosis not present

## 2017-01-13 DIAGNOSIS — E875 Hyperkalemia: Principal | ICD-10-CM | POA: Diagnosis present

## 2017-01-13 DIAGNOSIS — I120 Hypertensive chronic kidney disease with stage 5 chronic kidney disease or end stage renal disease: Secondary | ICD-10-CM | POA: Diagnosis not present

## 2017-01-13 DIAGNOSIS — R55 Syncope and collapse: Secondary | ICD-10-CM | POA: Diagnosis not present

## 2017-01-13 DIAGNOSIS — D631 Anemia in chronic kidney disease: Secondary | ICD-10-CM | POA: Diagnosis not present

## 2017-01-13 DIAGNOSIS — Z992 Dependence on renal dialysis: Secondary | ICD-10-CM

## 2017-01-13 DIAGNOSIS — I2510 Atherosclerotic heart disease of native coronary artery without angina pectoris: Secondary | ICD-10-CM | POA: Diagnosis present

## 2017-01-13 DIAGNOSIS — K219 Gastro-esophageal reflux disease without esophagitis: Secondary | ICD-10-CM | POA: Diagnosis present

## 2017-01-13 DIAGNOSIS — E1122 Type 2 diabetes mellitus with diabetic chronic kidney disease: Secondary | ICD-10-CM | POA: Diagnosis not present

## 2017-01-13 DIAGNOSIS — Z86718 Personal history of other venous thrombosis and embolism: Secondary | ICD-10-CM

## 2017-01-13 DIAGNOSIS — N186 End stage renal disease: Secondary | ICD-10-CM | POA: Diagnosis not present

## 2017-01-13 DIAGNOSIS — Z87891 Personal history of nicotine dependence: Secondary | ICD-10-CM

## 2017-01-13 DIAGNOSIS — Z9115 Patient's noncompliance with renal dialysis: Secondary | ICD-10-CM

## 2017-01-13 DIAGNOSIS — E8770 Fluid overload, unspecified: Secondary | ICD-10-CM

## 2017-01-13 DIAGNOSIS — Z8249 Family history of ischemic heart disease and other diseases of the circulatory system: Secondary | ICD-10-CM

## 2017-01-13 LAB — RENAL FUNCTION PANEL
ANION GAP: 12 (ref 5–15)
Albumin: 3 g/dL — ABNORMAL LOW (ref 3.5–5.0)
BUN: 17 mg/dL (ref 6–20)
CALCIUM: 8.2 mg/dL — AB (ref 8.9–10.3)
CHLORIDE: 97 mmol/L — AB (ref 101–111)
CO2: 27 mmol/L (ref 22–32)
Creatinine, Ser: 6.41 mg/dL — ABNORMAL HIGH (ref 0.61–1.24)
GFR calc Af Amer: 10 mL/min — ABNORMAL LOW (ref >60)
GFR calc non Af Amer: 9 mL/min — ABNORMAL LOW (ref >60)
GLUCOSE: 78 mg/dL (ref 65–99)
POTASSIUM: 3.9 mmol/L (ref 3.5–5.1)
Phosphorus: 4 mg/dL (ref 2.5–4.6)
SODIUM: 136 mmol/L (ref 135–145)

## 2017-01-13 LAB — CBC
HCT: 34.6 % — ABNORMAL LOW (ref 39.0–52.0)
HEMOGLOBIN: 11.1 g/dL — AB (ref 13.0–17.0)
MCH: 26.4 pg (ref 26.0–34.0)
MCHC: 32.1 g/dL (ref 30.0–36.0)
MCV: 82.4 fL (ref 78.0–100.0)
Platelets: 124 10*3/uL — ABNORMAL LOW (ref 150–400)
RBC: 4.2 MIL/uL — ABNORMAL LOW (ref 4.22–5.81)
RDW: 17.8 % — ABNORMAL HIGH (ref 11.5–15.5)
WBC: 4 10*3/uL (ref 4.0–10.5)

## 2017-01-13 NOTE — Progress Notes (Signed)
Frank Ely, MD notified regarding patients D/C. HD stated that patient will not be dialyzed until this afternoon or later. MD stated that she would leave in the D/C order and the patient could go after HD. Will continue to monitor.

## 2017-01-13 NOTE — Care Management CC44 (Signed)
Condition Code 44 Documentation Completed  Patient Details  Name: Frank [name] MRN: [id number] Date of Birth: 10-01-1965   Condition Code 44 given:   YES Patient signature on Condition Code 44 notice:   YES Documentation of 2 MD's agreement:   YES Code 44 added to claim:   [employer], Rory Percy, RN 01/13/2017, 2:58 PM

## 2017-01-13 NOTE — Progress Notes (Signed)
Dialysis treatment completed.  3000 mL ultrafiltrated and net fluid removal 2500 mL.    Patient status unchanged. Lung sounds diminished to ausculation in all fields. Generalized edema. Cardiac: NSR.  Disconnected lines and removed needles.  Pressure held for 10 minutes and band aid/gauze dressing applied.  Report given to bedside RN, Otila Kluver.

## 2017-01-13 NOTE — Progress Notes (Signed)
Patients B/P is 170/112. MD notified. Hetty Ely, MD stated to continue to hold B/P medications. Orders followed. Will continue to monitor.

## 2017-01-13 NOTE — Progress Notes (Signed)
MD stated that patient could be D/C'd after HD if no complications arise during treatment.

## 2017-01-13 NOTE — Care Management Obs Status (Signed)
[place] NOTIFICATION   Patient Details  Name: Frank [name] MRN: [id number] Date of Birth: 08-13-1965   Medicare Observation Status Notification Given:  Yes    Savir Blanke, Rory Percy, RN 01/13/2017, 3:00 PM

## 2017-01-13 NOTE — Discharge Summary (Signed)
Name: Frank [name] MRN: [id number] DOB: [birth year] 51 y.o. PCP: Patient, [name]  Date of Admission: 01/12/2017 12:35 AM Date of Discharge: 01/13/2017 Attending Physician: Lucious Groves, DO  Discharge Diagnosis: 1.   Chest pain   Discharge Medications: Allergies as of 01/14/2017      Reactions   Butalbital-apap-caffeine Shortness Of Breath, Swelling, Other (See Comments)   Swelling in throat   Ferrlecit [na Ferric Gluc Cplx In Sucrose] Shortness Of Breath, Swelling, Other (See Comments)   Swelling in throat, tolerates Venofor   Minoxidil Shortness Of Breath   Tylenol [acetaminophen] Anaphylaxis, Swelling   Darvocet [propoxyphene N-acetaminophen] Hives      Medication List    STOP taking these medications   HYDROmorphone 4 MG tablet Commonly known as:  DILAUDID     TAKE these medications   amLODipine 10 MG tablet Commonly known as:  NORVASC Take 1 tablet (10 mg total) by mouth at bedtime.   cloNIDine 0.3 mg/24hr patch Commonly known as:  CATAPRES - Dosed in mg/24 hr Place onto the skin once a week. On Wednesday   doxazosin 4 MG tablet Commonly known as:  CARDURA Take 4 mg by mouth at bedtime.   FLUoxetine 40 MG capsule Commonly known as:  PROZAC Take 40 mg by mouth every morning.   gabapentin 100 MG capsule Commonly known as:  NEURONTIN Take 100 mg by mouth 3 (three) times daily.   hydrALAZINE 100 MG tablet Commonly known as:  APRESOLINE Take 1 tablet (100 mg total) by mouth 2 (two) times daily.   isosorbide mononitrate 60 MG 24 hr tablet Commonly known as:  IMDUR Take 1 tablet (60 mg total) by mouth daily.   metoCLOPramide 5 MG tablet Commonly known as:  REGLAN Take 1 tablet (5 mg total) by mouth every 8 (eight) hours as needed for nausea.   pantoprazole 40 MG tablet Commonly known as:  PROTONIX Take 1 tablet (40 mg total) by mouth 2 (two) times daily before a meal.   sevelamer carbonate 800 MG tablet Commonly known as:  RENVELA Take 1 tablet  (800 mg total) by mouth 3 (three) times daily with meals.   traMADol 50 MG tablet Commonly known as:  ULTRAM Take 1 tablet (50 mg total) by mouth every 12 (twelve) hours as needed for moderate pain or severe pain.       Disposition and follow-up:   Frank [name] was discharged from [facility] in Stable condition.  At the hospital follow up visit please address:  1.  GERD- please reinforce that he attends GI follow up   Chronic pain - please reinforce that he keeps pain medication clinic, can be aggressive and demanding of pain medications at times.   Bradycardia- Reinforce that blister pack does not contain metoprolol.   2.  Labs / imaging needed at time of follow-up: BMP, CBC   3.  Pending labs/ test needing follow-up: none   Follow-up Appointments: Follow-up Information    Estanislado Emms, MD Follow up.   Specialty:  Nephrology Contact information: [place] [address] [phone]           Hospital Course by problem list:    ESRD on hemodialysis [facility])   Chest pain   Abdominal pain   Volume overload 51 yo man with PMH ESRD, HTN,  HLD, dCHF, NICM, DM2 who presented with right sided chest pain which began 2-3 days prior to presentation. The chest pain was sharp and worsened with deep breathing and was reproducible on palpation. The pain was associated with vomiting, nausea, and abdominal pain. He had been unable to keep food down including pain medications. [place] database was reviewed and showed that he has been getting frequent small # prescriptions for pain medication, he was not forthcoming with where he gets pain medications from. He also reported constipation and that he had not had a BM in 2 days. He reported compliance with HD and that he was not leaving early. In the ED he was given dilaudid without improvement in his chest pain. CXR showed pulmonary edema. Nephrology was consulted for dialysis and noted that  he had completed his outpatient dialysis session since the time prior to admission but that he had not returned to his dry weight. Troponin peaked at 0.03 then trended down. EKG showed no signs of acute ischemia. He went for urgent HD and reported his chest pain improved. He was given an additional dialysis session to get back to his schedule days. This session was not completed in full. On the morning of discharge he insisted that his pain had never gotten better with dialysis, the pain was reproducible but he refused to describe it for [place] and declined further workup. We recommended that he be sure he was taking protonix twice daily (which was prescribed after last admission) and that he keep his follow up scheduled with GI ( this was scheduled two admissions prior for the same presentation).   Discharge Vitals:   BP (!) 170/115 (BP Location: Right Arm)   Pulse 81   Temp 97.8 F (36.6 C) (Oral)   Resp 18   Ht 6' 2" (1.88 m)   Wt 158 lb 15.2 oz (72.1 kg)   SpO2 95%   BMI 20.41 kg/m   Pertinent Labs, Studies, and Procedures:  Trop <0.03 >>0.03  >><0.03  Discharge Instructions: Discharge Instructions    Diet - low sodium heart healthy    Complete by:  As directed    Increase activity slowly    Complete by:  As directed       Signed: Ledell Noss, MD 01/16/2017, 8:18 PM   Pager: [phone]

## 2017-01-13 NOTE — Progress Notes (Signed)
Internal Medicine Attending:   I saw and examined the patient. I reviewed the resident's note and I agree with the resident's findings and plan as documented in the resident's note. Patinet reports doing well, protonix has helped his dyspepsia.  Volume removal has helped chest pain, SOB.  Will have dialysis today and discharge after.

## 2017-01-13 NOTE — Progress Notes (Signed)
[facility] Progress Note   Subjective:  "I had a little chest pain awhile ago. I need some pain medicine to go home with because I will need it when I have pain again!" Lying flat in bed, NAD.   Objective Vitals:   01/13/17 0136 01/13/17 0139 01/13/17 0634 01/13/17 0939  BP: (!) 190/114 (!) 189/109 (!) 183/94 (!) 156/90  Pulse: 68 66 74 97  Resp: _0 Temp: 98.1 F (36.7 C)  97.8 F (36.6 C) 98.4 F (36.9 C)  TempSrc:   Oral Oral  SpO2:   96% 96%  Weight:   72.6 kg (160 lb)   Height:   6' 2" (1.88 m)    Physical Exam General: chronically ill appearing male, NAD Heart: S1,S2, RRR No JVD Lungs: CTAB A/P Abdomen: active BS Extremities: No LE edema Dialysis Access: LFA AVF + bruit   Additional Objective Labs: Basic Metabolic Panel:  Recent Labs Lab 01/11/17 2258 01/12/17 0652 01/13/17 0514  NA 136 141 136  K 4.6 4.6 3.9  CL 96* 99* 97*  CO2 _1 GLUCOSE 72 73 78  BUN 36* 39* 17  CREATININE 10.17* 10.79* 6.41*  CALCIUM 7.8* 8.0* 8.2*  PHOS  --  5.5* 4.0   Liver Function Tests:  Recent Labs Lab 01/11/17 2258 01/12/17 0652 01/13/17 0514  AST 19  --   --   ALT 9*  --   --   ALKPHOS 135*  --   --   BILITOT 0.8  --   --   PROT 7.3  --   --   ALBUMIN 3.4* 3.0* 3.0*    Recent Labs Lab 01/11/17 2258  LIPASE 25   CBC:  Recent Labs Lab 01/11/17 2258 01/12/17 0652 01/13/17 0514  WBC 4.1 3.8* 4.0  HGB 11.8* 10.9* 11.1*  HCT 36.5* 33.9* 34.6*  MCV 84.3 85.4 82.4  PLT 121* 128* 124*   Blood Culture    Component Value Date/Time   SDES NASAL SWAB 08/18/2015 1800   SPECREQUEST NONE 08/18/2015 1800   CULT  08/18/2015 1800    NORMAL NASOPHARYNGEAL FLORA Performed at [facility] 08/21/2015 FINAL 08/18/2015 1800    Cardiac Enzymes:  Recent Labs Lab 01/12/17 0044 01/12/17 0652 01/12/17 1138 01/12/17 1815  TROPONINI <0.03 <0.03 0.03* <0.03   CBG:  Recent Labs Lab 01/06/17 1305  01/06/17 1644 01/06/17 2106 01/07/17 0721 01/07/17 1204  GLUCAP 196* 108* 152* 79 93   Iron Studies: No results for input(s): IRON, TIBC, TRANSFERRIN, FERRITIN in the last 72 hours. _2 @ Studies/Results: Dg Chest 2 View  Result Date: 01/12/2017 CLINICAL DATA:  Chest pain.  Weakness for 1 day. EXAM: CHEST  2 VIEW COMPARISON:  Radiograph 11/29/2016 FINDINGS: Stable cardiomegaly and mediastinal contours. Again seen vascular congestion. Mild increase in pulmonary edema. Trace fluid in the right minor fissure and blunting of the both costophrenic angles, small pleural effusions. No confluent airspace disease. No pneumothorax. IMPRESSION: Increased pulmonary edema, small pleural effusions. Stable cardiomegaly. Electronically Signed   By: Jeb Levering M.D.   On: 01/12/2017 00:06   Medications:  . amLODipine  10 mg Oral QHS  . calcitRIOL  3.5 mcg Oral Once per day on Mon Wed Fri  . Chlorhexidine Gluconate Cloth  6 each Topical Q0600  . [START ON 01/15/2017] cloNIDine  0.3 mg Transdermal Weekly  . diclofenac sodium  2 g Topical QID  . doxazosin  4 mg Oral QHS  .  feeding supplement (PRO-STAT SUGAR FREE 64)  30 mL Oral BID  . FLUoxetine  40 mg Oral q morning - 10a  . gabapentin  100 mg Oral TID  . heparin  5,000 Units Subcutaneous Q8H  . hydrALAZINE  100 mg Oral BID  . isosorbide mononitrate  60 mg Oral Daily  . mupirocin ointment  1 application Nasal BID  . pantoprazole  40 mg Oral BID AC  . senna-docusate  2 tablet Oral BID  . sevelamer carbonate  800 mg Oral TID WC  Dialysis Orders:  MWF at [facility] 4hr, BFR 400, DFR800, 2K/2.25Ca, EDW 72.5kg,  -Heparin 2000 units IV TIW - Mircera 161mg IV q 2 weeks ordered (last given 2040m on 12/18/16) - Calcitriol 3.61m61mPO TIW ( just increased from 2mc52mor PTH 1714 on 6/20) - Venofer 100mg58mx 10 ordered (1 given outpt for tsat 29% on 6/20)   Assessment/Plan: 1. Pulmonary edema: Ran 3 hours 31 minutes of last OP tx 01/10/17.  Left 1.4 kg above EDW. Had HD 01/12/17 and will have HD today on schedule.  2. R sided chest pain: Troponin negative. Asking for "handful of pain medicine to to home with today". Per primary. Probably R/T volume overload.  3. ESRD -MWF. HD today on schedule. K+ 3.9 use 2.0 K bath.  4. Anemia - HGB 11.1 No ESA needed. Last Tsat 29%. Reduce Fe load to 100 mg Venofer X 3 doses.  5. Secondary hyperparathyroidism - Cont binders, VDRA.  6. HTN/volume -BP elevated. Continue home meds: Cardura 4 mg PO daily, hydralazine 100 mg PO BID, clonidine patch 0.3 mg/24 hr q week. Amlodipine 10 mg PO daily. isosorbide mononitrate 60 mg PO daily. Attempt UFG 2.5 today.  7. Nutrition - renal diet, renal vit/nepro. Albumin 3.0  Disposition: DC home post HD.   Rita H. Brown NP-C 01/13/2017, 11:47 AM  [facility] [phone], examined and agree w A/P as above. Exercising last month and losing body weight, will lower dry wt at dc.  Max UF with HD today, ok for dc after HD.  Rob SKelly [employer] [phone]/2018, 2:23 PM

## 2017-01-13 NOTE — Progress Notes (Signed)
Subjective: Frank [name] says his chest pain has improved since he went for dialysis.   Objective:  Vital signs in last 24 hours: Vitals:   01/13/17 0136 01/13/17 0139 01/13/17 0634 01/13/17 0939  BP: (!) 190/114 (!) 189/109 (!) 183/94 (!) 156/90  Pulse: 68 66 74 97  Resp: _0 Temp: 98.1 F (36.7 C)  97.8 F (36.6 C) 98.4 F (36.9 C)  TempSrc:   Oral Oral  SpO2:   96% 96%  Weight:   160 lb (72.6 kg)   Height:   6' 2" (1.88 m)    Physical Exam  Constitutional: He is oriented to person, place, and time. He appears well-developed and well-nourished. No distress.  Sitting up in bed watching TV   HENT:  Head: Normocephalic and atraumatic.  Eyes: Conjunctivae are normal. Right eye exhibits no discharge. Left eye exhibits no discharge. No scleral icterus.  Cardiovascular: Normal rate and regular rhythm.   No murmur heard. Pulmonary/Chest: Effort normal. No respiratory distress. He has no wheezes.  Abdominal: Soft. Bowel sounds are normal. He exhibits no distension. There is no tenderness. There is no guarding.  Neurological: He is alert and oriented to person, place, and time.  Skin: Skin is warm and dry. He is not diaphoretic.  Psychiatric: He has a normal mood and affect. His behavior is normal.   Assessment/Plan:  Acute pulmonary edema  Chest pain  ESRD on dialysis [facility])  Chest pain is improving with dialysis and seems to be related to volume overload. He is going for inpatient dialysis today.  - continue tramadol q12 h  - voltaren gel  - Dilaudid 2 mg q12h   Abdominal pain  Resolved with protonix, he has not yet tried the GI cocktail.  - continue Protonix 40 mg BID   ESRD  -HD today   Hypertension  -Continue home meds of Cardura 4 mg daily, Imdur 60 mg daily, and clonidine patch 0.3 mg per 24-hour and amlodipine 10 mg daily.  Dispo: Anticipated discharge today after dialysis   Ledell Noss, MD 01/13/2017, 9:55 AM Pager: [phone]

## 2017-01-13 NOTE — Progress Notes (Signed)
To clarify etiology of patient's volume overload: Noncardiogrenic secondary to ESRD with chronic non compliance with hemodialysis.  Frank Groves, DO

## 2017-01-14 DIAGNOSIS — J810 Acute pulmonary edema: Secondary | ICD-10-CM | POA: Diagnosis not present

## 2017-01-14 DIAGNOSIS — D631 Anemia in chronic kidney disease: Secondary | ICD-10-CM | POA: Diagnosis not present

## 2017-01-14 DIAGNOSIS — N2581 Secondary hyperparathyroidism of renal origin: Secondary | ICD-10-CM | POA: Diagnosis not present

## 2017-01-14 DIAGNOSIS — Z992 Dependence on renal dialysis: Secondary | ICD-10-CM | POA: Diagnosis not present

## 2017-01-14 DIAGNOSIS — I120 Hypertensive chronic kidney disease with stage 5 chronic kidney disease or end stage renal disease: Secondary | ICD-10-CM | POA: Diagnosis not present

## 2017-01-14 DIAGNOSIS — N186 End stage renal disease: Secondary | ICD-10-CM | POA: Diagnosis not present

## 2017-01-14 MED ORDER — CLONIDINE HCL 0.3 MG/24HR TD PTWK
0.3000 mg | MEDICATED_PATCH | TRANSDERMAL | Status: DC
  Administered 2017-01-14: 0.3 mg via TRANSDERMAL
  Filled 2017-01-14: qty 1

## 2017-01-14 MED ORDER — HYDROMORPHONE HCL 2 MG PO TABS
ORAL_TABLET | ORAL | Status: AC
  Filled 2017-01-14: qty 1

## 2017-01-14 MED ORDER — PREDNISONE 5 MG PO TABS: 5.0000 mg | ORAL_TABLET | Freq: Every day | ORAL | 0 refills | Status: DC

## 2017-01-14 MED ORDER — CLONIDINE HCL 0.3 MG/24HR TD PTWK: 0.3000 mg | MEDICATED_PATCH | TRANSDERMAL | Status: DC

## 2017-01-14 NOTE — Progress Notes (Signed)
   Subjective: Frank [name] says that he developed chest pain during dialysis overnight and had to stop the treatment. He expresses that he is concerned that we have not found the cause of it yet. Yesterday he had said that his pain became better with dialysis but today he says that is not the case. He will not describe the pain.   Objective:  Vital signs in last 24 hours: Vitals:   01/14/17 0430 01/14/17 0500 01/14/17 0525 01/14/17 1006  BP: (!) 160/110 (!) 155/48 (!) 141/48 (!) 190/122  Pulse: 80 78 80 81  Resp: _0 Temp:   98.1 F (36.7 C) 97.8 F (36.6 C)  TempSrc:   Oral Oral  SpO2:   95% 94%  Weight:   158 lb 15.2 oz (72.1 kg)   Height:       Physical Exam  Constitutional: He is oriented to person, place, and time. He appears well-developed and well-nourished. No distress.  Sitting up in bed watching TV   HENT:  Head: Normocephalic and atraumatic.  Eyes: Conjunctivae are normal. Right eye exhibits no discharge. Left eye exhibits no discharge. No scleral icterus.  Cardiovascular: Normal rate and regular rhythm.   No murmur heard. Pulmonary/Chest: Effort normal. No respiratory distress. He exhibits tenderness.  Neurological: He is alert and oriented to person, place, and time.  Skin: Skin is warm and dry. He is not diaphoretic.  Psychiatric: He has a normal mood and affect. His behavior is normal.   Assessment/Plan:  Acute pulmonary edema  Chest pain  ESRD on dialysis [facility])  He will not describe the chest pain today. At presentation he had pulmonary edema and expresses now that he believes he has lost weight, his dry weight has been decreased to 71.5 this admission. Unfortunately, he was not able to tolerate a full dialysis treatment. He describes an active lifestyle- says he walks his dog for an hour every morning and does not have tachycardia or new oxygen requirement. His prior presentation was for abdominal pain which seemed consistent with GERD, he says that he's  been using protonix BID since the time of the prior discharge but he uses blister packs so its hard to know for sure. He has not yet tried GI cocktail so we will try this now.  - tramadol q12 h  - voltaren gel  - Dilaudid 2 mg q12h, discontinued as he should be discharged before the next dose is needed   Abdominal pain  Today he denies abdominal pain but his epigastric chest wall tenderness is concerning for GERD. Has not yet tried GI cocktail so he will be asked to try this today.  - continue Protonix 40 mg BID   ESRD  Went for HD yesterday evening, renal labs were not ordered for this morning because he was prepared to leave after dialysis.   Hypertension  SBP was 140-150 this morning, unfortunately this became increased after our discussion about controlled substances.  -Continue home meds of Cardura 4 mg daily, Imdur 60 mg daily, and clonidine patch 0.3 mg per 24-hour and amlodipine 10 mg daily  Dispo: Anticipated discharge today after dialysis   Ledell Noss, MD 01/14/2017, 11:15 AM Pager: [phone]

## 2017-01-14 NOTE — Progress Notes (Signed)
MD stated to give pt. B/P meds and re-check B/P in 2 hours. MD stated to let her know of readings. Orders followed. Will continue to monitor.

## 2017-01-14 NOTE — Discharge Instructions (Signed)
Frank [name],   It has been a pleasure working with you and we are glad you're feeling better.   For your chest pain  CONTINUE taking the protonix twice daily   Follow up with your primary care provider in 1-2 weeks  If your symptoms worsen or you develop new symptoms, please seek medical help whether it is your primary care provider or emergency department.  If you have any questions about this hospitalization please call [phone].

## 2017-01-14 NOTE — Progress Notes (Signed)
MD stated to give clonidine patch prior to patients D/C. Order followed. Will continue to monitor.

## 2017-01-14 NOTE — Progress Notes (Signed)
[name] to be D/C'd Home per MD order.  Discussed prescriptions and follow up appointments with the patient. Prescriptions given to patient, medication list explained in detail. Pt verbalized understanding.  Allergies as of 01/14/2017      Reactions   Butalbital-apap-caffeine Shortness Of Breath, Swelling, Other (See Comments)   Swelling in throat   Ferrlecit [na Ferric Gluc Cplx In Sucrose] Shortness Of Breath, Swelling, Other (See Comments)   Swelling in throat, tolerates Venofor   Minoxidil Shortness Of Breath   Tylenol [acetaminophen] Anaphylaxis, Swelling   Darvocet [propoxyphene N-acetaminophen] Hives      Medication List    STOP taking these medications   HYDROmorphone 4 MG tablet Commonly known as:  DILAUDID     TAKE these medications   amLODipine 10 MG tablet Commonly known as:  NORVASC Take 1 tablet (10 mg total) by mouth at bedtime.   cloNIDine 0.3 mg/24hr patch Commonly known as:  CATAPRES - Dosed in mg/24 hr Place onto the skin once a week. On Wednesday   doxazosin 4 MG tablet Commonly known as:  CARDURA Take 4 mg by mouth at bedtime.   FLUoxetine 40 MG capsule Commonly known as:  PROZAC Take 40 mg by mouth every morning.   gabapentin 100 MG capsule Commonly known as:  NEURONTIN Take 100 mg by mouth 3 (three) times daily.   hydrALAZINE 100 MG tablet Commonly known as:  APRESOLINE Take 1 tablet (100 mg total) by mouth 2 (two) times daily.   isosorbide mononitrate 60 MG 24 hr tablet Commonly known as:  IMDUR Take 1 tablet (60 mg total) by mouth daily.   metoCLOPramide 5 MG tablet Commonly known as:  REGLAN Take 1 tablet (5 mg total) by mouth every 8 (eight) hours as needed for nausea.   pantoprazole 40 MG tablet Commonly known as:  PROTONIX Take 1 tablet (40 mg total) by mouth 2 (two) times daily before a meal.   sevelamer carbonate 800 MG tablet Commonly known as:  RENVELA Take 1 tablet (800 mg total) by mouth 3 (three) times daily with  meals.   traMADol 50 MG tablet Commonly known as:  ULTRAM Take 1 tablet (50 mg total) by mouth every 12 (twelve) hours as needed for moderate pain or severe pain.       Vitals:   01/14/17 1006 01/14/17 1209  BP: (!) 190/122 (!) 170/115  Pulse: 81 81  Resp: 18 18  Temp: 97.8 F (36.6 C)     Skin clean, dry and intact without evidence of skin break down, no evidence of skin tears noted. IV catheter discontinued intact. Site without signs and symptoms of complications. Dressing and pressure applied. Pt denies pain at this time. No complaints noted.  An After Visit Summary was printed and given to the patient. Patient escorted via [place], and D/C home via private auto.  Dixie Dials RN, BSN

## 2017-01-14 NOTE — Progress Notes (Signed)
01/14/2017 10:22 AM  Spoke to patient regarding care concerns and assisted in service recovery. Patient still requesting to speak with MD. Paged MD team who stated that they would speak with patient after morning rounds. Department Director notified.   MD Team at bedside.   [employer], RN-BC [employer] Phone [phone]

## 2017-01-14 NOTE — Progress Notes (Signed)
Internal Medicine Attending:   I saw and examined the patient. I reviewed the resident's note and I agree with the resident's findings and plan as documented in the resident's note.  Patient underwent HD this AM.  At 8:40 AM Dr Hetty Ely, Dr Juleen China and I saw the patient. Minutes before this Dr Hetty Ely had seen him where he had requested to be discharged with oral dilaudid as he had during his previous hospital admission as well as 'every time he comes to the hospital.'  Dr Hetty Ely had told him that she would not be prescribing the medication as we had prescribed him medication at his last hospitalization to get him to the same pain management appointment.  She had also reviewed the [place] and noted he has frequent prescriptions from multiple providers for opioid pain medications.  He requested to speak with her supervisor. On my initial evaluation he was very upset and reported he did not understand why he was not receiving pain medication for discharge and that he needed it to prevent him from having to come back to the hospital.  He reports he did not understand why we would not give it to him the way "they" had given it to him last time.  I reminded him that we were the same doctors as last week and that we gave him medicaiton to get to his appointment.  I also tried to inquire exactly what pain he wanted [place] to treat as he had told me yesterday that his abdominal pain was improved. He reported that he developed some chest pain towards the end of the session, and that he has chest wall pain that hurts when he presses on it.  The pain has been constant.  He would not be more forth coming about the issue as he reports that he has already told us about this and its the "whole reason he is here."  I tried to get more information about the chest pain as well as offer additional testing while reinforcing that previous test for his chest pain were negative for a cardiac cause and that we had thought that addition dialysis was  helping his pain.  I also discussed that his previous admission we had given him protonix which had helped and he has a GI follow up tomorrow.  He then became agitated and reported that he does not feel that we are treating him and only "looking at the computer and the past."  We stayed for an extended time listening to his complaints, 25 minutes, he declined further measures and I discussed that I think he can be discharged as previously discussed.   We were subsequently called back that the patient as he reportedly using a loud voice at the nursing station wanted to speak to his physicians.  We again returned, and discussed his care.  He was much calmer at this point.  He spoke much more openly about his concerns.  In the interim his blood pressure had been rechecked prior to discharge and had increased from 141/48 to now 190/122.  This has him concerned, he also reported that since dialysis had not completely solved his pain he feels that we have not figured out what is wrong.  I discussed again that it appears his lungs have improved and that his heart has shown no signs of damage.  I am concerned that he could have gastritis and would like him to see his GI doctor tomorrow.  It may also be a component of inflammation  of his MSK system that will also take time to heal.  But that in my best judgement he could be discharged home today.  He seemed more agreeable to discharge.  I also discussed that I think his blood pressure is likely elevated from our earlier conversation, he has received his normal bp medication and I suspect it will come down.  He discussed that his clonidine patch is due to be changed and I agreed that we could do this.  I told him as well as his nurse that we should recheck his blood pressure in 2 hours, he can likely be safely discharged if improved at that time.

## 2017-01-14 NOTE — Progress Notes (Signed)
[facility] Progress Note   Subjective: Has DC orders but still here. Agitated because of not getting to talk to MD about pain medication. Security called. Now pleasant and cheerful, walking in halls. BP elevated, has been given hydralazine per primary. Will recheck at 1300 and send home then.   Objective Vitals:   01/14/17 0430 01/14/17 0500 01/14/17 0525 01/14/17 1006  BP: (!) 160/110 (!) 155/48 (!) 141/48 (!) 190/122  Pulse: 80 78 80 81  Resp: _0 Temp:   98.1 F (36.7 C) 97.8 F (36.6 C)  TempSrc:   Oral Oral  SpO2:   95% 94%  Weight:   72.1 kg (158 lb 15.2 oz)   Height:       Physical Exam General: chronically ill appearing male in NAD Heart: RRR Lungs: CTAB A/P Abdomen: Non-distended, non-tender Extremities: No LE edema Dialysis Access: LFA AVF + bruit  Additional Objective Labs: Basic Metabolic Panel:  Recent Labs Lab 01/11/17 2258 01/12/17 0652 01/13/17 0514  NA 136 141 136  K 4.6 4.6 3.9  CL 96* 99* 97*  CO2 _1 GLUCOSE 72 73 78  BUN 36* 39* 17  CREATININE 10.17* 10.79* 6.41*  CALCIUM 7.8* 8.0* 8.2*  PHOS  --  5.5* 4.0   Liver Function Tests:  Recent Labs Lab 01/11/17 2258 01/12/17 0652 01/13/17 0514  AST 19  --   --   ALT 9*  --   --   ALKPHOS 135*  --   --   BILITOT 0.8  --   --   PROT 7.3  --   --   ALBUMIN 3.4* 3.0* 3.0*    Recent Labs Lab 01/11/17 2258  LIPASE 25   CBC:  Recent Labs Lab 01/11/17 2258 01/12/17 0652 01/13/17 0514  WBC 4.1 3.8* 4.0  HGB 11.8* 10.9* 11.1*  HCT 36.5* 33.9* 34.6*  MCV 84.3 85.4 82.4  PLT 121* 128* 124*   Blood Culture    Component Value Date/Time   SDES NASAL SWAB 08/18/2015 1800   SPECREQUEST NONE 08/18/2015 1800   CULT  08/18/2015 1800    NORMAL NASOPHARYNGEAL FLORA Performed at [place] 08/21/2015 FINAL 08/18/2015 1800    Cardiac Enzymes:  Recent Labs Lab 01/12/17 0044 01/12/17 0652 01/12/17 1138 01/12/17 1815   TROPONINI <0.03 <0.03 0.03* <0.03   CBG:  Recent Labs Lab 01/07/17 1204  GLUCAP 93   Iron Studies: No results for input(s): IRON, TIBC, TRANSFERRIN, FERRITIN in the last 72 hours. _2 @ Studies/Results: No results found. Medications:  . amLODipine  10 mg Oral QHS  . calcitRIOL  3.5 mcg Oral Once per day on Mon Wed Fri  . Chlorhexidine Gluconate Cloth  6 each Topical Q0600  . [START ON 01/15/2017] cloNIDine  0.3 mg Transdermal Weekly  . diclofenac sodium  2 g Topical QID  . doxazosin  4 mg Oral QHS  . feeding supplement (PRO-STAT SUGAR FREE 64)  30 mL Oral BID  . FLUoxetine  40 mg Oral q morning - 10a  . gabapentin  100 mg Oral TID  . heparin  5,000 Units Subcutaneous Q8H  . hydrALAZINE  100 mg Oral BID  . isosorbide mononitrate  60 mg Oral Daily  . mupirocin ointment  1 application Nasal BID  . pantoprazole  40 mg Oral BID AC  . senna-docusate  2 tablet Oral BID  . sevelamer carbonate  800 mg Oral TID WC   Dialysis Orders:  MWF at [place], BFR 400, DFR800, 2K/2.25Ca, EDW 72.5kg,  -Heparin 2000 units IV TIW - Mircera 169mg IV q 2 weeks ordered (last given 2030m on 12/18/16) - Calcitriol 3.72m67mPO TIW ( just increased from 2mc14mor PTH 1714 on 6/20) - Venofer 100mg37mx 10 ordered (1 given outpt for tsat 29% on 6/20)  Assessment/Plan: 1. Pulmonary edema: Ran 3 hours 31 minutes of last OP tx 01/10/17. Left 1.4 kg above EDW. Had HD 01/12/17 and finished HD early this AM. Apparently patient has been exercising and has lost body wt. Will have to monitor EDW closely as OP and lower appropriately.  2. R sided chest pain: Troponin negative. 3. ESRD -MWF. Next treatment at OP center assuming pt finally leaves hospital.  4. Anemia - HGB 11.1 No ESA needed. Last Tsat 29%. Reduce Fe load to 100 mg Venofer X 3 doses.  5. Secondary hyperparathyroidism - Cont binders, VDRA.  6. HTN/volume -BP elevated. Continue home meds: Cardura 4 mg PO daily, hydralazine 100 mg PO  BID, clonidine patch 0.3 mg/24 hr q week. Amlodipine 10 mg PO daily. isosorbide mononitrate 60 mg PO daily. HD last evening Pre wt 73.9 kg Net UF 1820 Post wt 72.1 kg. Make new EDW 71.5 kg.  7. Nutrition - renal diet, renal vit/nepro. Albumin 3.0  Disposition: DC home. Continue serially lowering EDW as appropriate.   Rita H. Brown NP-C 01/13/2017, 11:47 AM  [phone] Jimmye Normanwn NP-C 01/14/2017, 11:04 AM  [facility] [phone], examined and agree w A/P as above.  Rob SKelly SplinterarolNewell [employer]bermaidr [phone], 3:33 PM

## 2017-01-14 NOTE — Progress Notes (Signed)
Patient requesting to talk with MD about pain medication. Patient became agitated and cursed at NS per NS. Security called. Patient now in room. MD notified. MD stated that they would be up to see the patient. Will continue to monitor.

## 2017-01-14 NOTE — Progress Notes (Signed)
MD notified regarding D/C orders. MD stated that she will come see the patient prior to D/C. Will continue to monitor.

## 2017-01-16 ENCOUNTER — Other Ambulatory Visit: Payer: Self-pay

## 2017-01-16 ENCOUNTER — Encounter (HOSPITAL_COMMUNITY): Payer: Self-pay | Admitting: Nurse Practitioner

## 2017-01-16 ENCOUNTER — Inpatient Hospital Stay (HOSPITAL_COMMUNITY)
Admission: EM | Admit: 2017-01-16 | Discharge: 2017-01-17 | DRG: 640 | Discharge status: Against Medical Advice | Payer: Medicare Other | Attending: Internal Medicine | Admitting: Internal Medicine

## 2017-01-16 DIAGNOSIS — I120 Hypertensive chronic kidney disease with stage 5 chronic kidney disease or end stage renal disease: Secondary | ICD-10-CM | POA: Diagnosis not present

## 2017-01-16 DIAGNOSIS — K219 Gastro-esophageal reflux disease without esophagitis: Secondary | ICD-10-CM | POA: Diagnosis present

## 2017-01-16 DIAGNOSIS — Z87891 Personal history of nicotine dependence: Secondary | ICD-10-CM | POA: Diagnosis not present

## 2017-01-16 DIAGNOSIS — E11649 Type 2 diabetes mellitus with hypoglycemia without coma: Secondary | ICD-10-CM | POA: Diagnosis present

## 2017-01-16 DIAGNOSIS — N186 End stage renal disease: Secondary | ICD-10-CM | POA: Diagnosis not present

## 2017-01-16 DIAGNOSIS — E875 Hyperkalemia: Secondary | ICD-10-CM

## 2017-01-16 DIAGNOSIS — D631 Anemia in chronic kidney disease: Secondary | ICD-10-CM | POA: Diagnosis not present

## 2017-01-16 DIAGNOSIS — Z9115 Patient's noncompliance with renal dialysis: Secondary | ICD-10-CM | POA: Diagnosis not present

## 2017-01-16 DIAGNOSIS — Z8249 Family history of ischemic heart disease and other diseases of the circulatory system: Secondary | ICD-10-CM | POA: Diagnosis not present

## 2017-01-16 DIAGNOSIS — Z992 Dependence on renal dialysis: Secondary | ICD-10-CM | POA: Diagnosis not present

## 2017-01-16 DIAGNOSIS — R55 Syncope and collapse: Secondary | ICD-10-CM

## 2017-01-16 DIAGNOSIS — I5042 Chronic combined systolic (congestive) and diastolic (congestive) heart failure: Secondary | ICD-10-CM | POA: Diagnosis present

## 2017-01-16 DIAGNOSIS — R112 Nausea with vomiting, unspecified: Secondary | ICD-10-CM

## 2017-01-16 DIAGNOSIS — I2510 Atherosclerotic heart disease of native coronary artery without angina pectoris: Secondary | ICD-10-CM | POA: Diagnosis present

## 2017-01-16 DIAGNOSIS — I132 Hypertensive heart and chronic kidney disease with heart failure and with stage 5 chronic kidney disease, or end stage renal disease: Secondary | ICD-10-CM | POA: Diagnosis present

## 2017-01-16 DIAGNOSIS — Z86718 Personal history of other venous thrombosis and embolism: Secondary | ICD-10-CM | POA: Diagnosis not present

## 2017-01-16 DIAGNOSIS — N2581 Secondary hyperparathyroidism of renal origin: Secondary | ICD-10-CM | POA: Diagnosis not present

## 2017-01-16 DIAGNOSIS — E1122 Type 2 diabetes mellitus with diabetic chronic kidney disease: Secondary | ICD-10-CM | POA: Diagnosis present

## 2017-01-16 LAB — COMPREHENSIVE METABOLIC PANEL
ALK PHOS: 129 U/L — AB (ref 38–126)
ALT: 8 U/L — AB (ref 17–63)
AST: 21 U/L (ref 15–41)
Albumin: 3.3 g/dL — ABNORMAL LOW (ref 3.5–5.0)
Anion gap: 12 (ref 5–15)
BUN: 51 mg/dL — AB (ref 6–20)
CHLORIDE: 100 mmol/L — AB (ref 101–111)
CO2: 25 mmol/L (ref 22–32)
CREATININE: 13.36 mg/dL — AB (ref 0.61–1.24)
Calcium: 8.7 mg/dL — ABNORMAL LOW (ref 8.9–10.3)
GFR calc Af Amer: 4 mL/min — ABNORMAL LOW (ref >60)
GFR, EST NON AFRICAN AMERICAN: 4 mL/min — AB (ref >60)
Glucose, Bld: 109 mg/dL — ABNORMAL HIGH (ref 65–99)
Potassium: 5.8 mmol/L — ABNORMAL HIGH (ref 3.5–5.1)
Sodium: 137 mmol/L (ref 135–145)
Total Bilirubin: 0.6 mg/dL (ref 0.3–1.2)
Total Protein: 7 g/dL (ref 6.5–8.1)

## 2017-01-16 LAB — I-STAT CHEM 8, ED
BUN: 54 mg/dL — ABNORMAL HIGH (ref 6–20)
CALCIUM ION: 0.92 mmol/L — AB (ref 1.15–1.40)
CREATININE: 13.4 mg/dL — AB (ref 0.61–1.24)
Chloride: 99 mmol/L — ABNORMAL LOW (ref 101–111)
GLUCOSE: 86 mg/dL (ref 65–99)
HCT: 35 % — ABNORMAL LOW (ref 39.0–52.0)
HEMOGLOBIN: 11.9 g/dL — AB (ref 13.0–17.0)
POTASSIUM: 6.9 mmol/L — AB (ref 3.5–5.1)
Sodium: 135 mmol/L (ref 135–145)
TCO2: 28 mmol/L (ref 0–100)

## 2017-01-16 LAB — I-STAT TROPONIN, ED: Troponin i, poc: 0.03 ng/mL (ref 0.00–0.08)

## 2017-01-16 LAB — I-STAT CG4 LACTIC ACID, ED
Lactic Acid, Venous: 0.73 mmol/L (ref 0.5–1.9)
Lactic Acid, Venous: 2 mmol/L (ref 0.5–1.9)

## 2017-01-16 LAB — CBC
HCT: 30.8 % — ABNORMAL LOW (ref 39.0–52.0)
Hemoglobin: 9.7 g/dL — ABNORMAL LOW (ref 13.0–17.0)
MCH: 26.4 pg (ref 26.0–34.0)
MCHC: 31.5 g/dL (ref 30.0–36.0)
MCV: 83.7 fL (ref 78.0–100.0)
PLATELETS: 117 10*3/uL — AB (ref 150–400)
RBC: 3.68 MIL/uL — ABNORMAL LOW (ref 4.22–5.81)
RDW: 17.7 % — AB (ref 11.5–15.5)
WBC: 4.7 10*3/uL (ref 4.0–10.5)

## 2017-01-16 LAB — CBG MONITORING, ED
GLUCOSE-CAPILLARY: 55 mg/dL — AB (ref 65–99)
GLUCOSE-CAPILLARY: 116 mg/dL — AB (ref 65–99)
Glucose-Capillary: 106 mg/dL — ABNORMAL HIGH (ref 65–99)

## 2017-01-16 LAB — LIPASE, BLOOD: LIPASE: 27 U/L (ref 11–51)

## 2017-01-16 MED ORDER — ONDANSETRON HCL 4 MG/2ML IJ SOLN
4.0000 mg | Freq: Once | INTRAMUSCULAR | Status: DC
  Administered 2017-01-16: 4 mg via INTRAVENOUS

## 2017-01-16 MED ORDER — SODIUM POLYSTYRENE SULFONATE 15 GM/60ML PO SUSP
30.0000 g | Freq: Once | ORAL | Status: AC
  Administered 2017-01-16: 30 g via ORAL
  Filled 2017-01-16: qty 120

## 2017-01-16 MED ORDER — INSULIN ASPART 100 UNIT/ML IV SOLN: 5.0000 [IU] | Freq: Once | INTRAVENOUS | Status: DC

## 2017-01-16 MED ORDER — DEXTROSE 5 % IV SOLN: Freq: Once | INTRAVENOUS | Status: DC

## 2017-01-16 MED ORDER — INSULIN ASPART 100 UNIT/ML ~~LOC~~ SOLN: 5.0000 [IU] | Freq: Once | SUBCUTANEOUS | Status: DC

## 2017-01-16 MED ORDER — CALCITRIOL 0.5 MCG PO CAPS: 3.5000 ug | ORAL_CAPSULE | ORAL | Status: DC

## 2017-01-16 MED ORDER — CALCIUM GLUCONATE 10 % IV SOLN
1.0000 g | Freq: Once | INTRAVENOUS | Status: AC
  Administered 2017-01-17: 1 g via INTRAVENOUS
  Filled 2017-01-16: qty 10

## 2017-01-16 MED ORDER — DEXTROSE 50 % IV SOLN
1.0000 | Freq: Once | INTRAVENOUS | Status: AC
  Administered 2017-01-16: 50 mL via INTRAVENOUS
  Filled 2017-01-16: qty 50

## 2017-01-16 MED ORDER — INSULIN ASPART 100 UNIT/ML IV SOLN
5.0000 [IU] | Freq: Once | INTRAVENOUS | Status: AC
  Administered 2017-01-16: 5 [IU] via INTRAVENOUS
  Filled 2017-01-16: qty 0.05

## 2017-01-16 MED ORDER — CALCIUM CHLORIDE 10 % IV SOLN
1.0000 g | Freq: Once | INTRAVENOUS | Status: AC
  Administered 2017-01-16: 1 g via INTRAVENOUS

## 2017-01-16 MED ORDER — SODIUM CHLORIDE 0.9 % IV BOLUS (SEPSIS)
250.0000 mL | Freq: Once | INTRAVENOUS | Status: AC
  Administered 2017-01-16: 250 mL via INTRAVENOUS

## 2017-01-16 MED ORDER — DEXTROSE 50 % IV SOLN
INTRAVENOUS | Status: AC
  Filled 2017-01-16: qty 50

## 2017-01-16 MED ORDER — DEXTROSE 50 % IV SOLN
1.0000 | Freq: Once | INTRAVENOUS | Status: AC
  Administered 2017-01-16: 50 mL via INTRAVENOUS

## 2017-01-16 MED ORDER — INSULIN ASPART 100 UNIT/ML ~~LOC~~ SOLN
SUBCUTANEOUS | Status: AC
  Filled 2017-01-16: qty 1

## 2017-01-16 MED ORDER — ONDANSETRON HCL 4 MG/2ML IJ SOLN
INTRAMUSCULAR | Status: AC
  Administered 2017-01-16: 4 mg via INTRAVENOUS
  Filled 2017-01-16: qty 2

## 2017-01-16 MED ORDER — DEXTROSE-NACL 5-0.45 % IV SOLN
INTRAVENOUS | Status: DC
  Administered 2017-01-16: 22:00:00 via INTRAVENOUS

## 2017-01-16 NOTE — ED Notes (Signed)
Dr. Alvino Chapel notified on pt.'s bradycardia .

## 2017-01-16 NOTE — ED Triage Notes (Signed)
Patient endorses fatigue and feeling sick this evening at work. Patient drove self to ER and was noted by staff to be honking horn. Patient endorses syncopal episode when trying to get out of car to ER. Staff assisted patient the wheel chair- Patient pale, cool and diaphoretic with vomit on clothing. HR 38. Patient alert and oriented states noted his heart rate had been jumping around today.

## 2017-01-16 NOTE — H&P (Signed)
Date: 01/16/2017               Patient Name:  Frank [name] MRN: [id number]  DOB: [birth year] Age / Sex: 51 y.o., male   PCP: Patient, [name]         Medical Service: Internal Medicine Teaching Service         Attending Physician: Dr. Davonna Belling, MD    First Contact: Dr. Hetty Ely Pager: [phone]  Second Contact: Dr. Juleen China Pager: [phone]       After Hours (After 5p/  First Contact Pager: [phone]  weekends / holidays): Second Contact Pager: [phone]   Chief Complaint: Heart palpitations, emesis, syncope   History of Present Illness: Patient is a 51 yo M well known to IMTS with a pmhx of HTN, Type II DM, non ischemic cardiomypathy, chronic combined systolic and diastolic heart failure, and ESRD on HD (M/W/F) presenting with palpitations, syncope, and emesis. Patient has had two prior admissions this month requiring inpatient dialysis. Most recently discharged 3 days ago. History today was obtained from chart review. Patient was mostly uncooperative with answering questions. He did report compliance with outpatient dialysis and claimed his last HD session was yesterday that he completed without complication. Apparently he began to feel ill today and drove himself to the ER.   On arrival, patient was noted to be cool and diaphoretic with vomit on his clothing and HR of 38. BP was 107/78 with RR of 18, T 98.53F and sating 99% on RA. EKG showed junctional bradycardia with a ventricular rate of 41. Labs were significant for a potassium of 6.9. Trop was 0.03. Lactic acid 2.00. CBC with hemoglobin 9.7, down from 11.1 on discharge. Lipase was 27. In the ED he received 1 g of calcium chloride, 5 units of novolog, D50 amp, IV zofran 4 mg x 1, and 250 mL NS bolus.  Meds:  No outpatient prescriptions have been marked as taking for the 01/16/17 encounter [facility] Encounter).     Allergies: Allergies as of 01/16/2017 - Review Complete 01/16/2017  Allergen Reaction Noted  .  Butalbital-apap-caffeine Shortness Of Breath, Swelling, and Other (See Comments) 08/18/2015  . Ferrlecit [na ferric gluc cplx in sucrose] Shortness Of Breath, Swelling, and Other (See Comments) 06/24/2013  . Minoxidil Shortness Of Breath 08/18/2015  . Tylenol [acetaminophen] Anaphylaxis and Swelling 06/25/2016  . Darvocet [propoxyphene n-acetaminophen] Hives 10/16/2011   Past Medical History:  Diagnosis Date  . Anemia   . Anxiety   . Chronic combined systolic and diastolic CHF (congestive heart failure) (HCC)    a. EF 20-25% by echo in 08/2015 b. echo 10/2015: EF 35-40%, diffuse HK, severe LAE, moderate RAE, small pericardial effusion  . Complication of anesthesia    itching, sore throat  . Depression   . Dialysis patient ([place] [facility])   . ESRD (end stage renal disease) ([place])    due to HTN per patient, followed at [facility], s/p failed kidney transplant - dialysis Tue, Th, Sat  . Hyperkalemia 12/2015  . Hypertension   . Junctional rhythm    a. noted in 08/2015: hyperkalemic at that time  b. 12/2015: presented in junctional rhythm w/ K+ of 6.6. Resolved with improvement of K+ levels.  . Nonischemic cardiomyopathy ([place])    a. 08/2014: cath showing minimal CAD, but tortuous arteries noted.   . Personal history of DVT (deep vein thrombosis)/ PE 05/26/2016   In Oct 2015 had small subsemental LUL PE w/o DVT (LE dopplers neg) and was  felt to be HD cath related, treated w coumadin.  IN May 2016 had small vein DVT (acute/subacute) in the R basilic/ brachial veins of the RUE, resumed on coumadin.  Had R sided HD cath at that time.    . Renal insufficiency   . Shortness of breath   . Type II diabetes mellitus (HCC)    No history per patient, but remains under history as A1c would not be accurate given on dialysis    Family History: Unable to obtain   Social History: Patient denied alcohol, tobacco, and illicit drug use.   Review of Systems: Unable to complete ROS.    Physical Exam: Blood pressure  129/84, pulse 61, temperature 98.8 F (37.1 C), temperature source Oral, resp. rate 15, SpO2 97 %. Constitutional: NAD, appears comfortable, Asleep and snoring loudly on arrival, easy to awake HEENT: Atraumatic, normocephalic. PERRL, anicteric sclera.  Cardiovascular: Bradycardic but regular, no murmurs, rubs, or gallops.  Pulmonary/Chest: Poor inspiratory effort but CTAB, laying flat and breathing comfortably on RA Abdominal: Soft, non tender, non distended. +BS.  Extremities: Warm and well perfused. Distal pulses intact. No edema.  Neurological: A&Ox3, CN II - XII grossly intact.  Skin: No rashes or erythema  Psychiatric: Normal mood and affect  EKG: Personally reviewed. Junctional bradycardia with ventricular rate of 41.   CXR: Personally reviewed. Small bilateral pleural effusions with some pulmonary edema.   Assessment & Plan by Problem:   Patient is a 51 yo M well known to IMTS with a pmhx of HTN, Type II DM, non ischemic cardiomypathy, chronic combined systolic and diastolic heart failure, and ESRD on HD (M/W/F) presenting with heart palpitations, emesis, and syncope admitted for hyperkalemia and urgent dialysis.   ESRD on HD: Potassium 6.9 on arrival with junctional bradycardia, HR 41. S/p 1 g of calcium chloride and 5 units insulin with D50 in the ED. Potassium subsequently improved to 5.8. Patient is now back in sinus rhythm. Nephrology consulted per EDP, planning for HD this evening. This is his third admission for inpatient dialysis this month. Patient reports compliance with out patient dialysis however per nephrology note, has not attended any outpatient dialysis sessions since discharge. Last HD was 6/24 prior to discharge.  -- Nephrology consult, appreciate recs -- Planning for HD tonight and again tomorrow -- Kayexalate now per nephro  -- Repeat potassium at midnight if still waiting for HD  -- Tele monitoring  -- Continue calcitriol, renvela  -- Fluid restrict -- I/Os,  daily weights   ?AMS: Patient very somnolent on evaluation. Easy to awake but uncooperative with history taking. Would close his eyes and roll over in response to any questions. He denied illicit substance use but has exhibited multiple red flegs with narcotic seeking behavior during recent admissions. AMS could be secondary to uremia given HD non compliance, however will check a blood tox to rule out other causes.  -- Blood tox screen   HTN: Currently normotensive  -- Hold home amlodipine 10 mg daily  -- Hold home hydralazine 100 mg BID -- Hold home Imdur 60 mg daily  -- Continue clonidine patch 0.3 mg qweekly   ? Type II DM: Listed on EMR problem list but last A1c recorded was 4.6 on 10/05/2016. No insulin or diabetic medications on home med list. CBG was 55 in the ED after insulin administration. Received D50 amp.  -- SSI-sensative TID w/ meals -- CBG checks   GERD: -- Continue protonix 40 mg BID  Combined Systolic and Diastolic  HF: EF 35-40% on 10/31/2015  -- Volume management per HD   FEN: D5-1/2 NS @ 50cc/hr, lytes per HD, NPO pending improvement in mental status  VTE ppx: Heparin  Code Status: FULL  Dispo: Admit patient to Inpatient with expected length of stay greater than 2 midnights.  SignedVelna Ochs, MD 01/16/2017, 9:10 PM  Pager: [phone]

## 2017-01-16 NOTE — Progress Notes (Addendum)
[facility] Brief Note  Pt just discharged from [facility] 6.24 D/C summary reviewed Presented to the ED tonight with nausea/vomiting/bradycardia/K of 6.9 (istat) Was treated with glucose/insulin/Ca with improvement in HR  Pt had HD here 6/24 and that was last TMT until today He did not attend any dialysis sessions at his unit since discharge which probably explains his hyperkalemia  HD prescription: MWF [facility] (Dr. Florene Glen) 4 hours 2K2.25 Ca EDW 71 5 kg Heparin 2000 units Calcitirol 3.5 mcg TIW Mircera 75 mcg Q2weeks (has not rec'd dose since 5/30 I believe due to hosp/absences   Recent Labs  01/16/17 2013  NA 135  K 6.9*  CL 99*  GLUCOSE 86  BUN 54*  CREATININE 13.40*    Recent Labs  01/16/17 2013 01/16/17 2052  WBC  --  4.7  HGB 11.9* 9.7*  HCT 35.0* 30.8*  MCV  --  83.7  PLT  --  PENDING   Will plan for HD tonight but d/t large pt load of pts w/urgent need, will be a few hours before can get HD so recommend going ahead with kayexalate He will need HD again tomorrow to get back on schedule.  Full note to follow in the AM.  Jamal Maes, MD [place] Pager 01/16/2017, 9:34 PM

## 2017-01-16 NOTE — ED Notes (Signed)
Pt. repositioned for comfort , respirations unlabored , IV site intact .

## 2017-01-16 NOTE — ED Notes (Signed)
Pt. alert and oriented , denies pain , respirations unlabored , IV NS bolus infusing , iv site unremarkable .

## 2017-01-16 NOTE — ED Notes (Signed)
Dr. Alvino Chapel notified on pt.'s hypoglycemia , D50% 1 ampule given IV and D51/2NS IV drip infusing at 50 ml/hr. Pt. Alert /diaphoretic , respirations unlabored , denies pain .

## 2017-01-16 NOTE — ED Provider Notes (Signed)
[facility] DEPT Provider Note   CSN: [id number] Arrival date & time: 01/16/17  1957     History   Chief Complaint Chief Complaint  Patient presents with  . Loss of Consciousness    HPI Frank [name] is a 51 y.o. male.  HPI  Patient is a 51 year old male past medical history significant for ESRD on HD MWF, CHF, DM, HTN, who presents to the emergency department with a one-day history of not feeling well. Patient states that he was leaving a restaurant earlier when he felt lower extremity weakness and like he was going to pass out. States that he started having heart palpitations, had weakness in his bilateral lower extremities and felt like he was going to pass out. Patient drove himself to the emergency department. When he arrived to is only able to state that he felt like his heart rate was jumping around and he didn't feel well. Complaining of chest pain, nausea, vomiting. He states that he was compliant with his dialysis yesterday, states he completed the full session.  Past Medical History:  Diagnosis Date  . Anemia   . Anxiety   . Chronic combined systolic and diastolic CHF (congestive heart failure) (HCC)    a. EF 20-25% by echo in 08/2015 b. echo 10/2015: EF 35-40%, diffuse HK, severe LAE, moderate RAE, small pericardial effusion  . Complication of anesthesia    itching, sore throat  . Depression   . Dialysis patient ([place])   . ESRD (end stage renal disease) ([place])    due to HTN per patient, followed at [facility], s/p failed kidney transplant - dialysis Tue, Th, Sat  . Hyperkalemia 12/2015  . Hypertension   . Junctional rhythm    a. noted in 08/2015: hyperkalemic at that time  b. 12/2015: presented in junctional rhythm w/ K+ of 6.6. Resolved with improvement of K+ levels.  . Nonischemic cardiomyopathy ([place])    a. 08/2014: cath showing minimal CAD, but tortuous arteries noted.   . Personal history of DVT (deep vein thrombosis)/ PE 05/26/2016   In Oct 2015 had small  subsemental LUL PE w/o DVT (LE dopplers neg) and was felt to be HD cath related, treated w coumadin.  IN May 2016 had small vein DVT (acute/subacute) in the R basilic/ brachial veins of the RUE, resumed on coumadin.  Had R sided HD cath at that time.    . Renal insufficiency   . Shortness of breath   . Type II diabetes mellitus (HCC)    No history per patient, but remains under history as A1c would not be accurate given on dialysis    Patient Active Problem List   Diagnosis Date Noted  . Volume overload 01/13/2017  . Aortic atherosclerosis ([place]) 01/05/2017  . Abdominal pain 08/04/2016  . Uremia 06/07/2016  . Hyperkalemia 05/29/2016  . GERD (gastroesophageal reflux disease) 05/29/2016  . Personal history of DVT (deep vein thrombosis)/ PE 05/26/2016  . Nonischemic cardiomyopathy ([place]) 01/09/2016  . Bilateral low back pain without sciatica   . Renal cyst, left 10/30/2015  . Constipation by delayed colonic transit 10/30/2015  . Acute on chronic combined systolic and diastolic congestive heart failure ([place]) 09/23/2015  . Chest pain 09/08/2015  . Adjustment disorder with mixed anxiety and depressed mood 08/20/2015  . Essential hypertension 01/02/2015  . Dyslipidemia   . Malignant hypertension 11/29/2014  . ESRD on hemodialysis ([place])   . Acute pulmonary edema (HCC)   . DM (diabetes mellitus), type 2, uncontrolled, with renal complications ([place])   .  Complex sleep apnea syndrome 05/05/2014  . Anemia of chronic kidney failure 06/24/2013    Past Surgical History:  Procedure Laterality Date  . CAPD INSERTION    . CAPD REMOVAL    . INGUINAL HERNIA REPAIR Right 02/14/2015   Procedure: REPAIR INCARCERATED RIGHT INGUINAL HERNIA;  Surgeon: [name], MD;  Location: [facility];  Service: General;  Laterality: Right;  . INSERTION OF DIALYSIS CATHETER Right 09/23/2015   Procedure: exchange of Right internal Dialysis Catheter.;  Surgeon: [name], MD;  Location: [facility];  Service: Vascular;   Laterality: Right;  . IR GENERIC HISTORICAL  07/16/2016   IR US GUIDE VASC ACCESS LEFT 07/16/2016 Corrie Mckusick, DO [facility]-INTER[facility]  . IR GENERIC HISTORICAL Left 07/16/2016   IR THROMBECTOMY AV FISTULA W/THROMBOLYSIS/PTA INC/SHUNT/IMG LEFT 07/16/2016 Corrie Mckusick, DO [facility]  . KIDNEY RECEIPIENT  2006   failed and started HD in March 2014  . LEFT HEART CATHETERIZATION WITH CORONARY ANGIOGRAM N/A 09/02/2014   Procedure: LEFT HEART CATHETERIZATION WITH CORONARY ANGIOGRAM;  Surgeon: [name], MD;  Location: [facility] CATH LAB;  Service: Cardiovascular;  Laterality: N/A;       Home Medications    Prior to Admission medications   Medication Sig Start Date End Date Taking? Authorizing Provider  amLODipine (NORVASC) 10 MG tablet Take 1 tablet (10 mg total) by mouth at bedtime. 12/01/16   Rice, Resa Miner, MD  cloNIDine (CATAPRES - DOSED IN MG/24 HR) 0.3 mg/24hr patch Place onto the skin once a week. On Wednesday 12/31/16   [provider]  hydrALAZINE (APRESOLINE) 100 MG tablet Take 1 tablet (100 mg total) by mouth 2 (two) times daily. 01/07/17   Ledell Noss, MD  isosorbide mononitrate (IMDUR) 60 MG 24 hr tablet Take 1 tablet (60 mg total) by mouth daily. 01/08/17   Ledell Noss, MD  metoCLOPramide (REGLAN) 5 MG tablet Take 1 tablet (5 mg total) by mouth every 8 (eight) hours as needed for nausea. 12/01/16   Rice, Resa Miner, MD  pantoprazole (PROTONIX) 40 MG tablet Take 1 tablet (40 mg total) by mouth 2 (two) times daily before a meal. 12/01/16   Rice, Resa Miner, MD  sevelamer carbonate (RENVELA) 800 MG tablet Take 1 tablet (800 mg total) by mouth 3 (three) times daily with meals. Patient taking differently: Take 800 mg by mouth 3 (three) times daily with meals. 800 mg as needed with snack 01/06/17   Ledell Noss, MD  traMADol (ULTRAM) 50 MG tablet Take 1 tablet (50 mg total) by mouth every 12 (twelve) hours as needed for moderate pain or severe pain. 12/01/16   Collier Salina, MD     Family History Family History  Problem Relation Age of Onset  . Hypertension Other     Social History Social History  Substance Use Topics  . Smoking status: Former Smoker    Packs/day: 0.00    Years: 1.00    Types: Cigarettes  . Smokeless tobacco: Never Used     Comment: quit Jan 2014  . Alcohol use No     Allergies   Butalbital-apap-caffeine; Ferrlecit [na ferric gluc cplx in sucrose]; Minoxidil; Tylenol [acetaminophen]; and Darvocet [propoxyphene n-acetaminophen]   Review of Systems Review of Systems  Constitutional: Positive for chills. Negative for appetite change and fever.  HENT: Negative for congestion.   Eyes: Negative for visual disturbance.  Respiratory: Positive for chest tightness. Negative for shortness of breath.   Cardiovascular: Positive for chest pain. Negative for palpitations.  Gastrointestinal: Positive  for abdominal pain, nausea and vomiting. Negative for blood in stool.  Genitourinary: Negative for testicular pain.  Musculoskeletal: Negative for back pain.  Skin: Negative for wound.  Neurological: Positive for syncope, weakness ( Generalized) and light-headedness. Negative for seizures.  Psychiatric/Behavioral: Positive for confusion.     Physical Exam Updated Vital Signs BP (!) 132/94   Pulse (!) 47   Temp 98.8 F (37.1 C) (Oral)   Resp (!) 9   SpO2 100%   Physical Exam  Constitutional: He is oriented to person, place, and time. He appears well-developed and well-nourished. He appears distressed.  HENT:  Head: Atraumatic.  Eyes: Conjunctivae are normal. Pupils are equal, round, and reactive to light.  Neck: Normal range of motion. Neck supple.  Cardiovascular: Intact distal pulses.   Bradycardia  Pulmonary/Chest: Effort normal and breath sounds normal. No respiratory distress.  Abdominal: Soft. He exhibits no distension and no mass. There is no tenderness. There is no guarding.  Musculoskeletal: Normal range of motion.   Neurological: He is alert and oriented to person, place, and time.  Skin: Skin is warm.  Left AV fistula with palpable thrill  Psychiatric: He has a normal mood and affect.     ED Treatments / Results  Labs (all labs ordered are listed, but only abnormal results are displayed) Labs Reviewed  CBC - Abnormal; Notable for the following:       Result Value   RBC 3.68 (*)    Hemoglobin 9.7 (*)    HCT 30.8 (*)    RDW 17.7 (*)    Platelets 117 (*)    All other components within normal limits  COMPREHENSIVE METABOLIC PANEL - Abnormal; Notable for the following:    Potassium 5.8 (*)    Chloride 100 (*)    Glucose, Bld 109 (*)    BUN 51 (*)    Creatinine, Ser 13.36 (*)    Calcium 8.7 (*)    Albumin 3.3 (*)    ALT 8 (*)    Alkaline Phosphatase 129 (*)    GFR calc non Af Amer 4 (*)    GFR calc Af Amer 4 (*)    All other components within normal limits  CBG MONITORING, ED - Abnormal; Notable for the following:    Glucose-Capillary 106 (*)    All other components within normal limits  CBG MONITORING, ED - Abnormal; Notable for the following:    Glucose-Capillary 55 (*)    All other components within normal limits  I-STAT CHEM 8, ED - Abnormal; Notable for the following:    Potassium 6.9 (*)    Chloride 99 (*)    BUN 54 (*)    Creatinine, Ser 13.40 (*)    Calcium, Ion 0.92 (*)    Hemoglobin 11.9 (*)    HCT 35.0 (*)    All other components within normal limits  I-STAT CG4 LACTIC ACID, ED - Abnormal; Notable for the following:    Lactic Acid, Venous 2.00 (*)    All other components within normal limits  CBG MONITORING, ED - Abnormal; Notable for the following:    Glucose-Capillary 116 (*)    All other components within normal limits  LIPASE, BLOOD  URINALYSIS, ROUTINE W REFLEX MICROSCOPIC  POTASSIUM  RENAL FUNCTION PANEL  PHOSPHORUS  CBC  DRUG SCREEN 10 W/CONF, SERUM  I-STAT TROPOININ, ED  I-STAT CG4 LACTIC ACID, ED    EKG  EKG Interpretation  Date/Time:  Thursday  January 16 2017 22:23:40 EDT Ventricular Rate:  50 PR Interval:    QRS Duration: 110 QT Interval:  590 QTC Calculation: 539 R Axis:   -14 Text Interpretation:  Sinus rhythm Borderline prolonged PR interval LVH with secondary repolarization abnormality Prolonged QT interval Baseline wander in lead(s) V3 When compared with ECG of EARLIER SAME DATE QT has lengthened Confirmed by Delora Fuel ([id number]) on 01/16/2017 10:53:20 PM       Radiology No results found.  Procedures Procedures (including critical care time)  Medications Ordered in ED Medications  calcitRIOL (ROCALTROL) capsule 3.5 mcg (not administered)  dextrose 5 % solution ( Intravenous Not Given 01/16/17 2225)  dextrose 5 %-0.45 % sodium chloride infusion ( Intravenous New Bag/Given 01/16/17 2219)  calcium gluconate 1 g in sodium chloride 0.9 % 100 mL IVPB (not administered)  insulin aspart (novoLOG) injection 5 Units (not administered)  calcium chloride injection 1 g (1 g Intravenous Given 01/16/17 2012)  insulin aspart (novoLOG) injection 5 Units (5 Units Intravenous Given 01/16/17 2016)  dextrose 50 % solution 50 mL (50 mLs Intravenous Given 01/16/17 2016)  sodium chloride 0.9 % bolus 250 mL (0 mLs Intravenous Stopped 01/16/17 2045)  sodium polystyrene (KAYEXALATE) 15 GM/60ML suspension 30 g (30 g Oral Given 01/16/17 2122)  dextrose 50 % solution 50 mL (50 mLs Intravenous Given 01/16/17 2220)     Initial Impression / Assessment and Plan / ED Course  I have reviewed the triage vital signs and the nursing notes.  Pertinent labs & imaging results that were available during my care of the patient were reviewed by me and considered in my medical decision making (see chart for details).     Patient is a 51 year old with ESRD HD MWF, CAD, recurrent noncompliance with his hemodialysis. Presents to the emergency department with a syncopal episode, emesis, bradycardia. Patient states that he did not miss dialysis and completed a full round  yesterday. Was recently discharged from the hospital 3 days ago for similar symptoms. On arrival patient is ill appearing, active emesis.   EKG showed bradycardia, rate 40s, prolonged intervals, no signs of AV block. No signs of acute ischemia. EKG concerning for hyperkalemia. Pads placed on the patient. Normotensive. Given calcium gluconate for EKG changes, insulin/glu, Kayexalate. Clonidine patch was removed.  Immediately following calcium patient had improvement to rate the 60s, normal sinus. K 6.9.   Initial lactic acid 2. BUN/creatinine 54/13. Lipase within normal limits. Initial troponin negative. Nephrology, Dr. Lorrene Reid, consultative for emergent dialysis in the setting of hyperkalemia. Recommended temporizing and she would arrange for dialysis. On further review patient didn't miss dialysis for the past 2 episodes. Pt not on BB or CCB - clonidine patch removed. Bradycardia likely d/t hyperkalemia.   Patient admitted to internal medicine for further management.  On reevaluation patient again became bradycardic, given calcium and insulin. Patient started on D5 infusion for hypoglycemia.   Final Clinical Impressions(s) / ED Diagnoses   Final diagnoses:  Syncope and collapse  Hyperkalemia  Nausea and vomiting, intractability of vomiting not specified, unspecified vomiting type    New Prescriptions New Prescriptions   No medications on file     Nathaniel Man, MD 01/17/17 Adelfa Koh    Davonna Belling, MD 01/17/17 [phone]

## 2017-01-16 NOTE — ED Notes (Signed)
Pharmacist notified on pt.'s Calcium Gluconate order.

## 2017-01-17 ENCOUNTER — Encounter (HOSPITAL_COMMUNITY): Payer: Self-pay

## 2017-01-17 LAB — RENAL FUNCTION PANEL
ANION GAP: 11 (ref 5–15)
Albumin: 3.3 g/dL — ABNORMAL LOW (ref 3.5–5.0)
BUN: 53 mg/dL — AB (ref 6–20)
CO2: 25 mmol/L (ref 22–32)
Calcium: 8.3 mg/dL — ABNORMAL LOW (ref 8.9–10.3)
Chloride: 99 mmol/L — ABNORMAL LOW (ref 101–111)
Creatinine, Ser: 13.3 mg/dL — ABNORMAL HIGH (ref 0.61–1.24)
GFR calc Af Amer: 4 mL/min — ABNORMAL LOW (ref >60)
GFR calc non Af Amer: 4 mL/min — ABNORMAL LOW (ref >60)
GLUCOSE: 122 mg/dL — AB (ref 65–99)
POTASSIUM: 6 mmol/L — AB (ref 3.5–5.1)
Phosphorus: 4.8 mg/dL — ABNORMAL HIGH (ref 2.5–4.6)
SODIUM: 135 mmol/L (ref 135–145)

## 2017-01-17 LAB — CBG MONITORING, ED
GLUCOSE-CAPILLARY: 53 mg/dL — AB (ref 65–99)
Glucose-Capillary: 208 mg/dL — ABNORMAL HIGH (ref 65–99)

## 2017-01-17 LAB — POTASSIUM: Potassium: 5.5 mmol/L — ABNORMAL HIGH (ref 3.5–5.1)

## 2017-01-17 MED ORDER — HEPARIN SODIUM (PORCINE) 5000 UNIT/ML IJ SOLN: 5000.0000 [IU] | Freq: Three times a day (TID) | INTRAMUSCULAR | Status: DC

## 2017-01-17 MED ORDER — SODIUM CHLORIDE 0.9% FLUSH: 3.0000 mL | INTRAVENOUS | Status: DC | PRN

## 2017-01-17 MED ORDER — DEXTROSE 50 % IV SOLN
1.0000 | Freq: Once | INTRAVENOUS | Status: AC
  Administered 2017-01-17: 50 mL via INTRAVENOUS

## 2017-01-17 MED ORDER — SEVELAMER CARBONATE 800 MG PO TABS: 800.0000 mg | ORAL_TABLET | Freq: Three times a day (TID) | ORAL | Status: DC

## 2017-01-17 MED ORDER — SODIUM CHLORIDE 0.9 % IV SOLN: 250.0000 mL | INTRAVENOUS | Status: DC | PRN

## 2017-01-17 MED ORDER — SODIUM CHLORIDE 0.9% FLUSH: 3.0000 mL | Freq: Two times a day (BID) | INTRAVENOUS | Status: DC

## 2017-01-17 MED ORDER — METOCLOPRAMIDE HCL 5 MG PO TABS: 5.0000 mg | ORAL_TABLET | Freq: Three times a day (TID) | ORAL | Status: DC | PRN

## 2017-01-17 MED ORDER — DEXTROSE 50 % IV SOLN
INTRAVENOUS | Status: AC
  Filled 2017-01-17: qty 50

## 2017-01-17 MED ORDER — PANTOPRAZOLE SODIUM 40 MG PO TBEC: 40.0000 mg | DELAYED_RELEASE_TABLET | Freq: Two times a day (BID) | ORAL | Status: DC

## 2017-01-17 MED FILL — Medication: Qty: 1 | Status: AC

## 2017-01-17 NOTE — Progress Notes (Signed)
Pt arrived to room 3M10 from hemodialysis.  Pt is refusing to allow assessment, or monitoring equipment stating, "I'm just here to get something to eat and then I'm gonna hit the road, I've got stuff to do".  Of note he also refused to allow his scheduled EKG to be done.  He reports no pain or distress and is alert and oriented. MD called to consult about the pts disposition and ask for diet orders. Renal diet orders were given and the pt asked for a frozen meatloaf dinner to be microwaved. At present he is up in the chair eating his meal.  MD is aware that the pt is refusing all further treatment at this time.  Will continue to monitor and pass along to day shift.

## 2017-01-17 NOTE — Progress Notes (Signed)
Pt signed out AMA. IV in rt upper arm removed and charted.  MD aware.

## 2017-01-17 NOTE — ED Notes (Addendum)
Pt. sleeping , respirations unlabored , IV site intact . Pt. Waiting for hemodialysis / in-patient bed assignment .

## 2017-01-18 ENCOUNTER — Other Ambulatory Visit (HOSPITAL_COMMUNITY)
Admission: RE | Admit: 2017-01-18 | Discharge: 2017-01-18 | Disposition: A | Discharge status: Home / Self Care | Payer: Medicare Other | Source: Other Acute Inpatient Hospital | Attending: Nephrology | Admitting: Nephrology

## 2017-01-18 DIAGNOSIS — T8610 Unspecified complication of kidney transplant: Secondary | ICD-10-CM | POA: Diagnosis not present

## 2017-01-18 DIAGNOSIS — N186 End stage renal disease: Secondary | ICD-10-CM | POA: Diagnosis not present

## 2017-01-18 DIAGNOSIS — E875 Hyperkalemia: Secondary | ICD-10-CM | POA: Insufficient documentation

## 2017-01-18 DIAGNOSIS — N2581 Secondary hyperparathyroidism of renal origin: Secondary | ICD-10-CM | POA: Diagnosis not present

## 2017-01-18 DIAGNOSIS — Z992 Dependence on renal dialysis: Secondary | ICD-10-CM | POA: Diagnosis not present

## 2017-01-18 DIAGNOSIS — D631 Anemia in chronic kidney disease: Secondary | ICD-10-CM | POA: Diagnosis not present

## 2017-01-18 LAB — POTASSIUM: Potassium: 4.2 mmol/L (ref 3.5–5.1)

## 2017-01-18 IMAGING — CR DG CHEST 2V
2 series · 2 of 2 positions shown · non-contrast
Comparison: CT chest 05/08/2014.  Chest 05/08/2014.

CLINICAL DATA: Left-sided chest pain.

EXAM:
CHEST  2 VIEW

[chest pa]
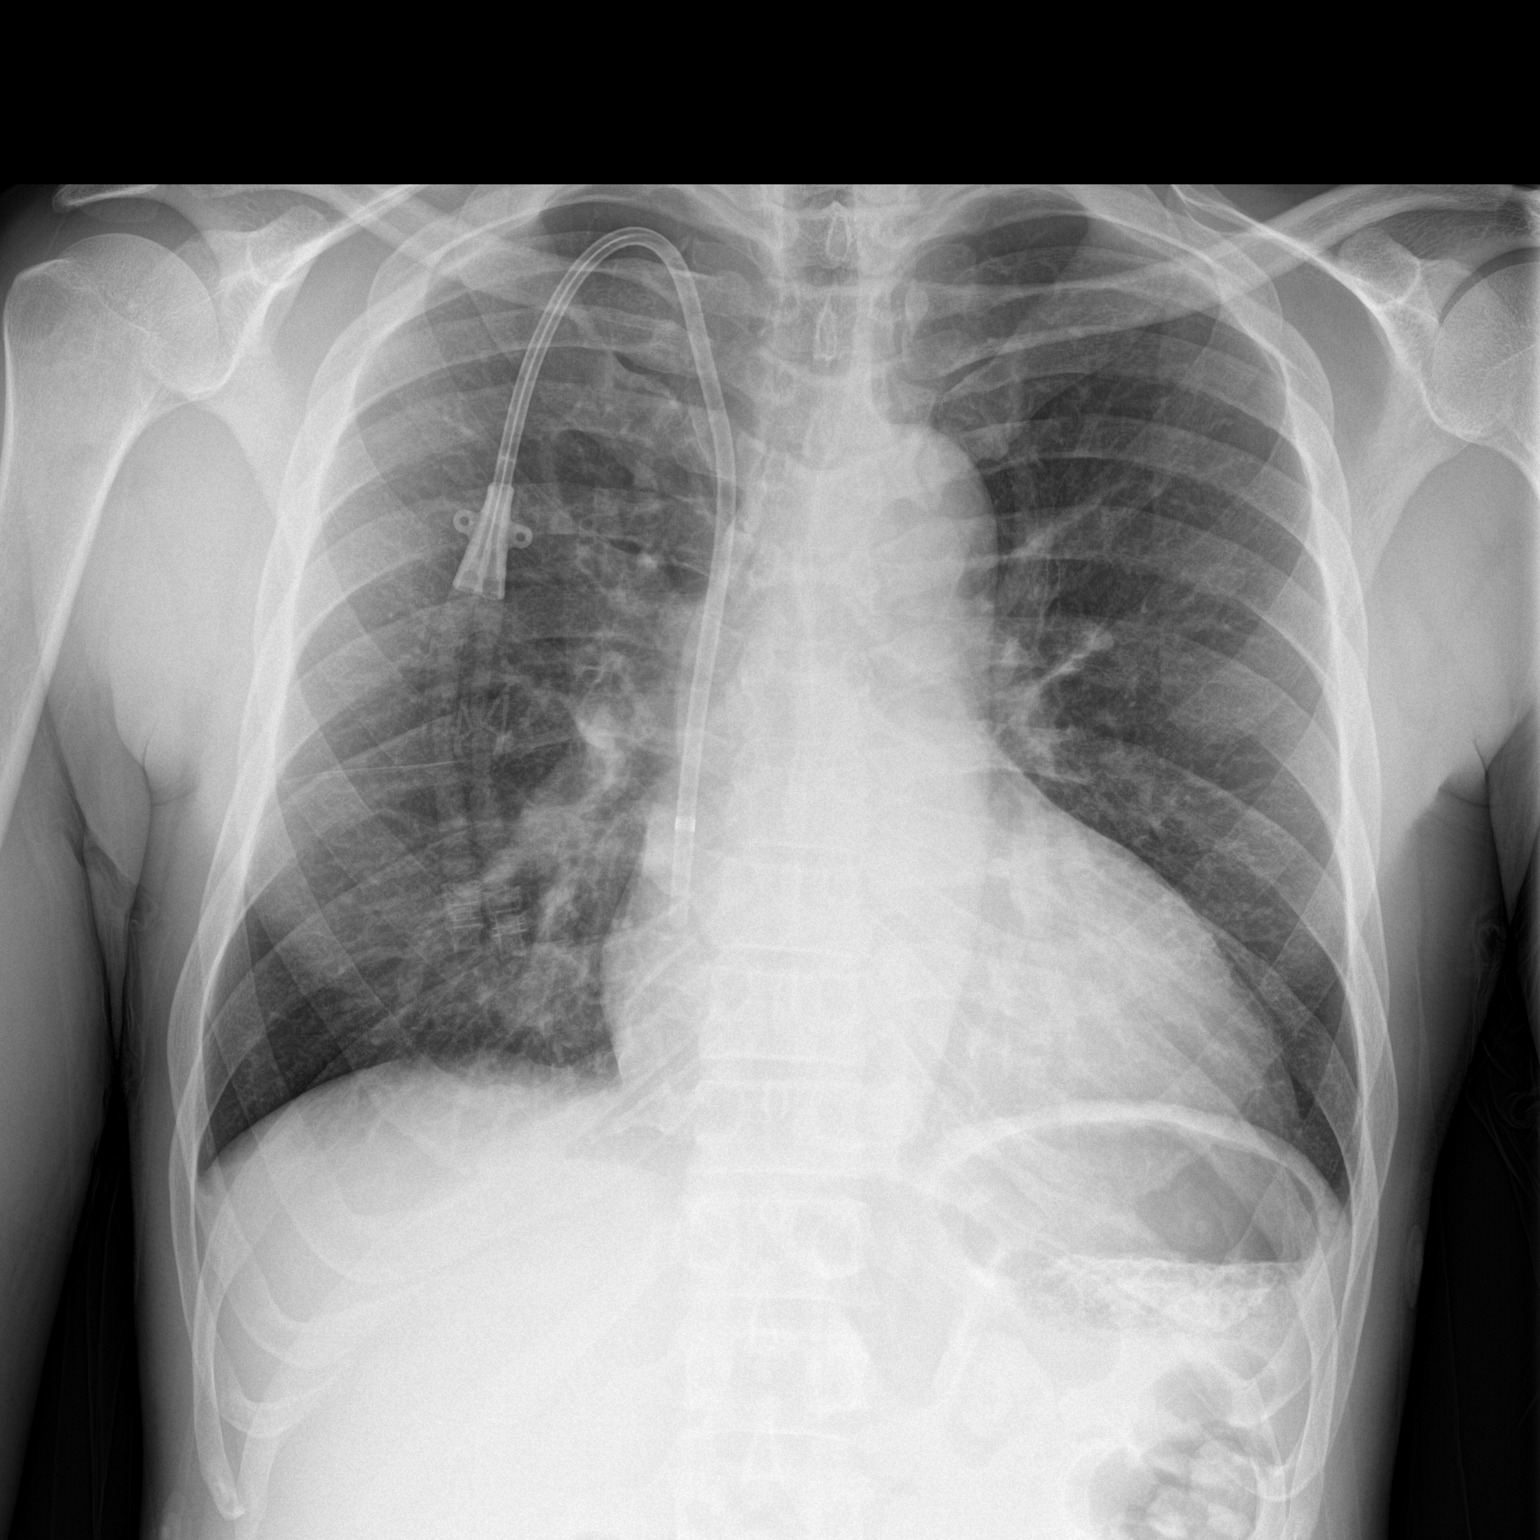

[chest lat]
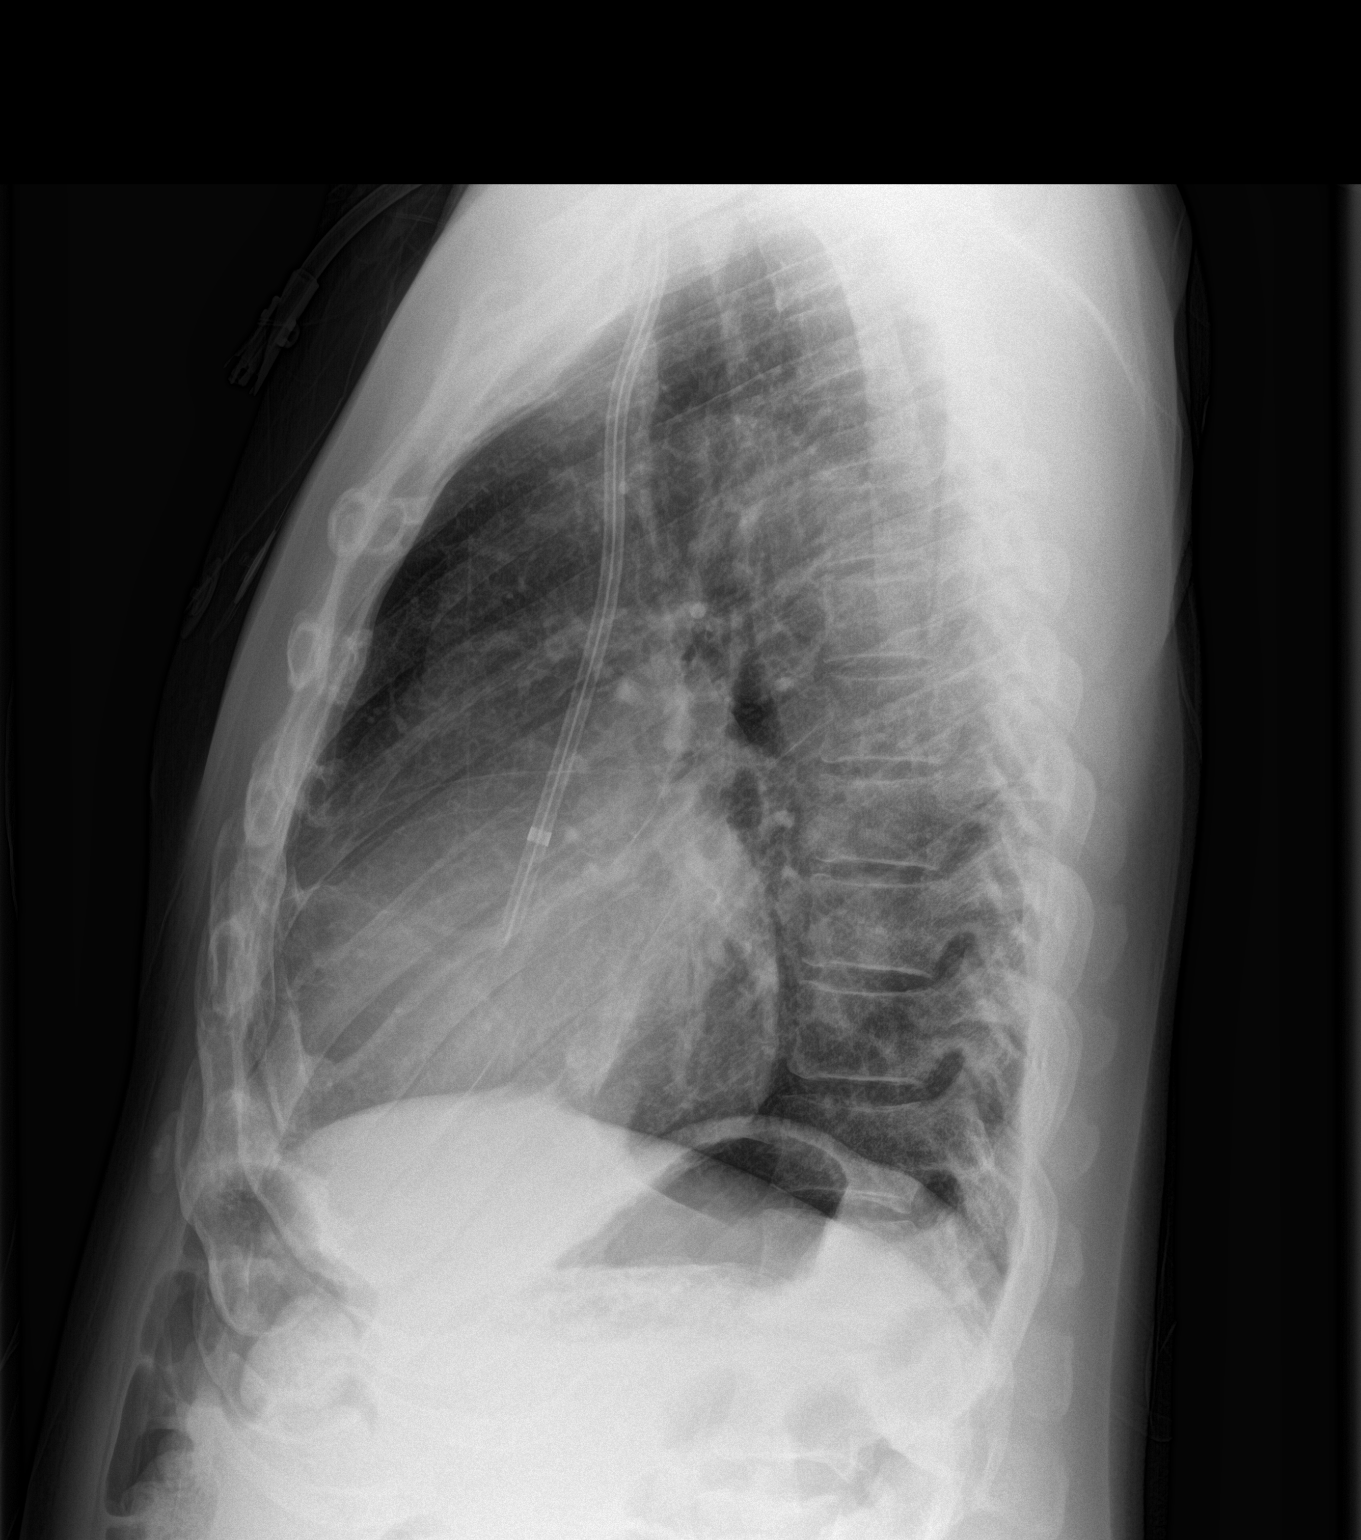

[2 of 2 positions shown; findings below may reference images not displayed]

FINDINGS: Right-sided central venous catheter is unchanged in position.
Borderline heart size with normal pulmonary vascularity. Lungs are
clear and expanded. No blunting of costophrenic angles. No
pneumothorax. Mediastinal contours appear intact.
IMPRESSION: No active cardiopulmonary disease.

## 2017-01-18 IMAGING — CT CT ANGIO CHEST
1 of 8 series · 17 of 36 positions shown · IV contrast (Iohexol (Omnipaque 350))
Comparison: CTA of the chest performed 05/08/2014, and chest
radiograph performed earlier today at [DATE] a.m.

CLINICAL DATA: Acute onset of left-sided chest pain and shortness
of breath, worsened

EXAM:
CT ANGIOGRAPHY CHEST WITH CONTRAST
TECHNIQUE: Multidetector CT imaging of the chest was performed using the
standard protocol during bolus administration of intravenous
contrast. Multiplanar CT image reconstructions and MIPs were
obtained to evaluate the vascular anatomy.
CONTRAST:  100mL OMNIPAQUE IOHEXOL 350 MG/ML SOLN

[Series 407: thins pacs · axial · 0.72mm/px · z∈[-340,-33]mm · 17 of 347 slices shown]
[im 20/347  lung]
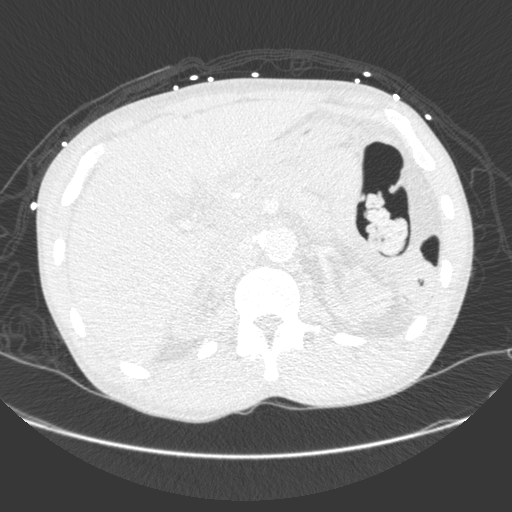
[im 39/347  mediastinal]
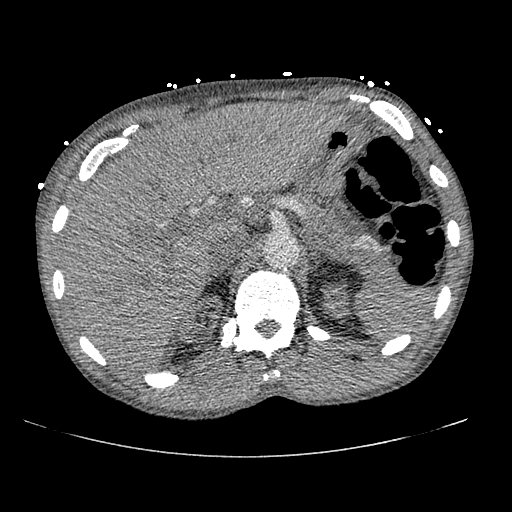
[im 58/347  lung]
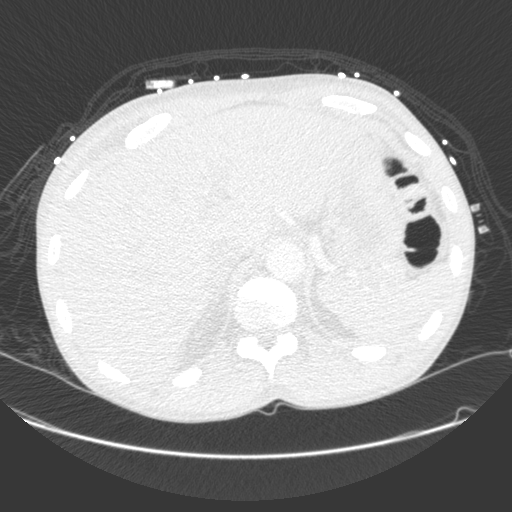
[im 77/347  mediastinal]
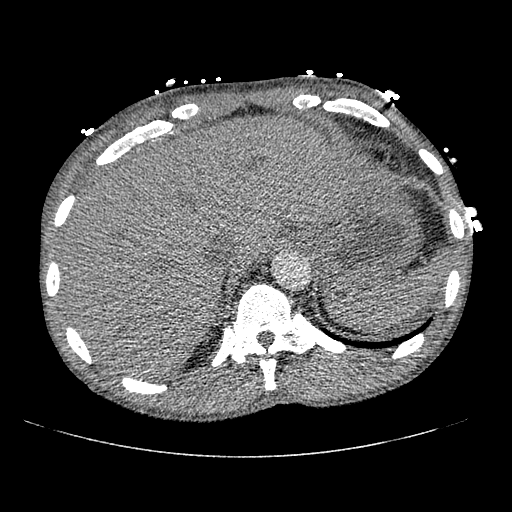
[im 97/347  lung]
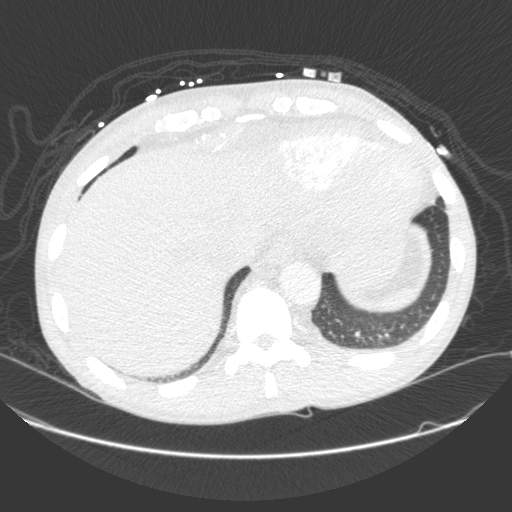
[im 116/347  mediastinal]
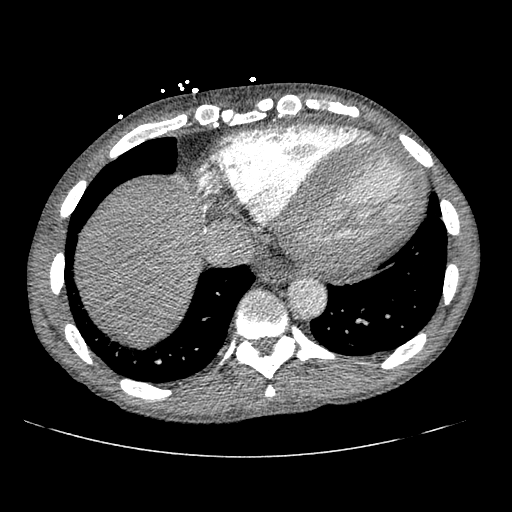
[im 135/347  lung]
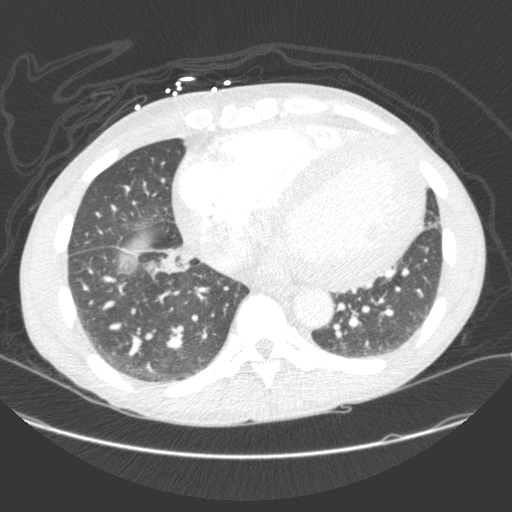
[im 154/347  mediastinal]
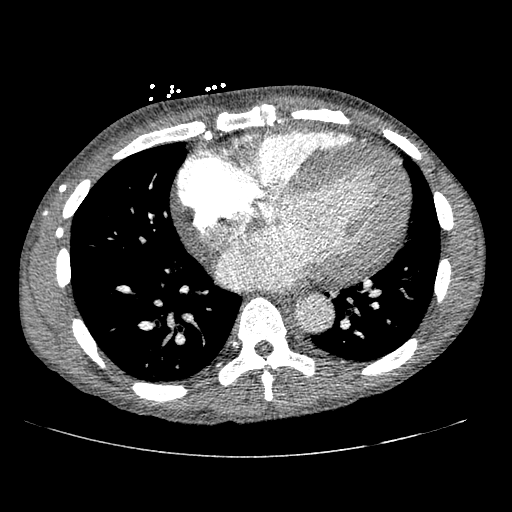
[im 174/347  lung]
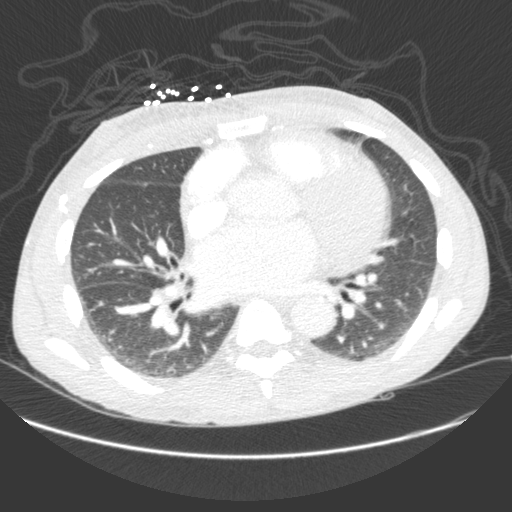
[im 193/347  mediastinal]
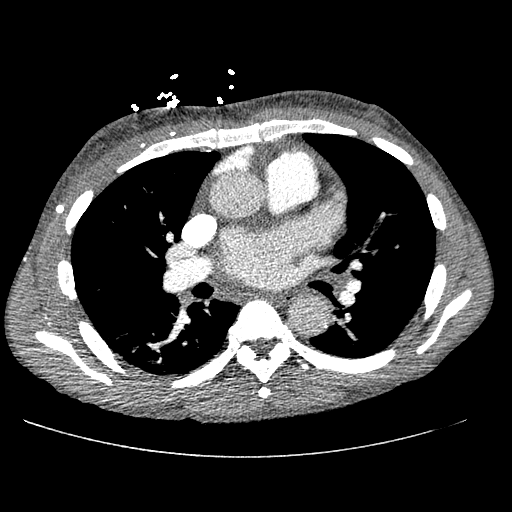
[im 212/347  lung]
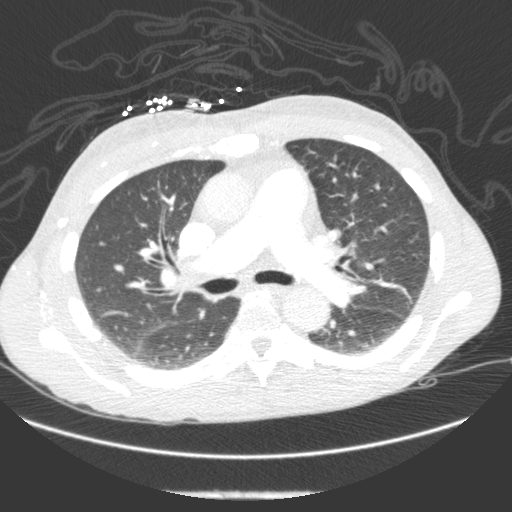
[im 231/347  mediastinal]
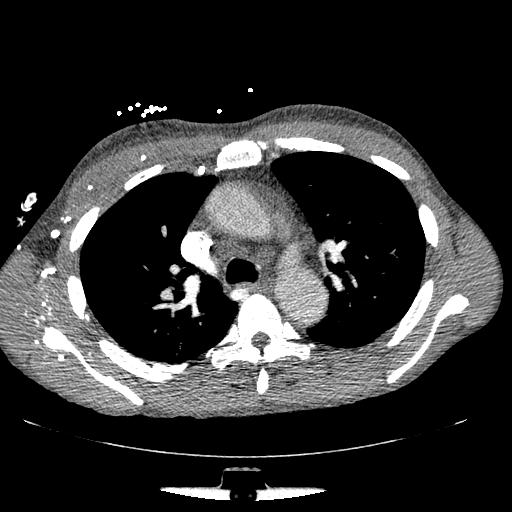
[im 250/347  lung]
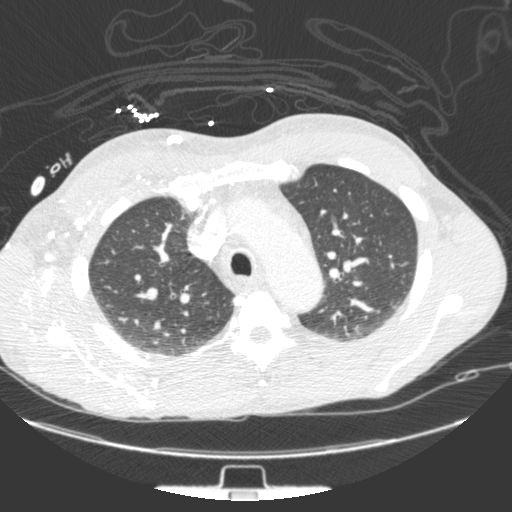
[im 270/347  mediastinal]
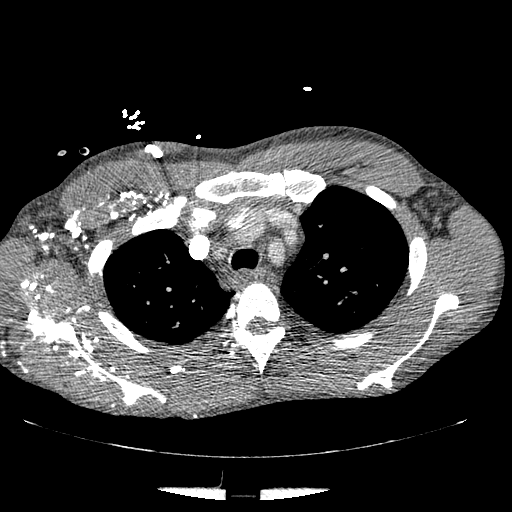
[im 289/347  lung]
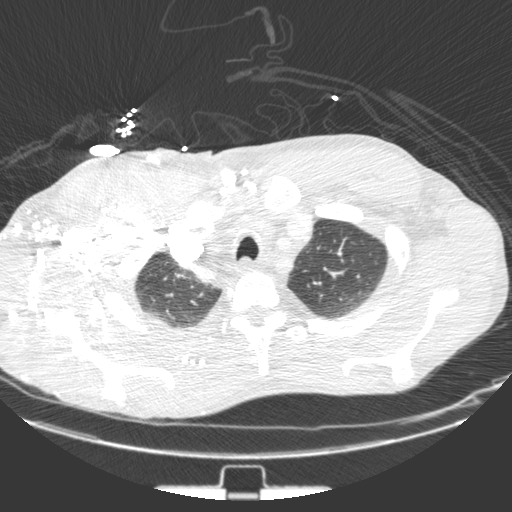
[im 308/347  mediastinal]
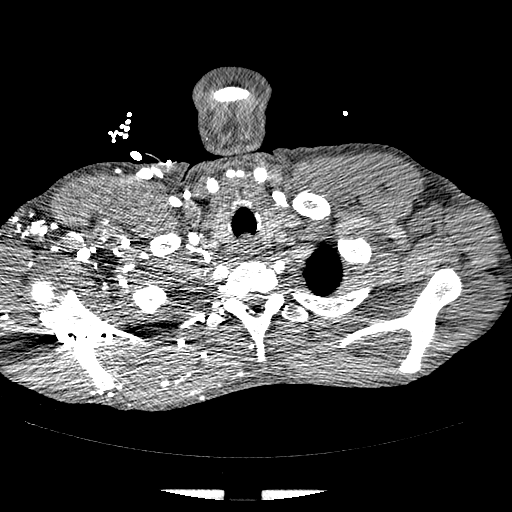
[im 327/347  lung]
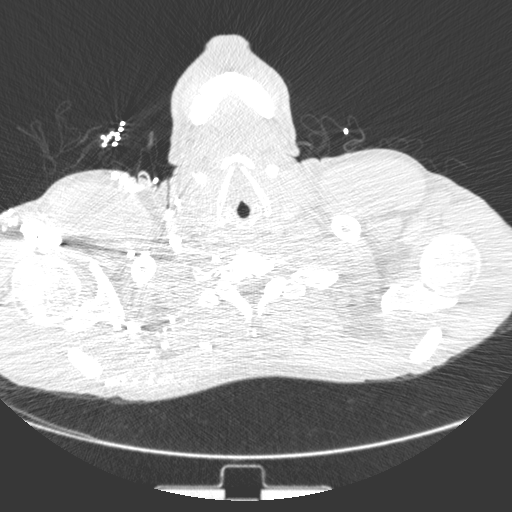

[17 of 36 positions shown; findings below may reference images not displayed]

FINDINGS: There is no evidence of pulmonary embolus.

Mild focal opacity is noted at the right lung base, raising question
for mild pneumonia. Minimal left basilar atelectasis is seen. The
lungs are otherwise grossly clear. There is no evidence of pleural
effusion or pneumothorax. No masses are identified; no abnormal
focal contrast enhancement is seen.

The heart is enlarged. There is mild aneurysmal dilatation of the
distal aortic arch, measuring up to 3.7 cm in diameter, which
resolves along the descending thoracic aorta. Trace pericardial
fluid remains within normal limits. No mediastinal lymphadenopathy
is seen. The great vessels are grossly unremarkable. No axillary
lymphadenopathy is seen. The visualized portions of the thyroid
gland are unremarkable in appearance.

The visualized portions of the liver and spleen are unremarkable.
The visualized portions of the pancreas, gallbladder, stomach and
adrenal glands are within normal limits. Severe bilateral renal
atrophy is noted. Numerous collateral vessels are noted about the
right side of the chest.

No acute osseous abnormalities are seen.

Review of the MIP images confirms the above findings.
IMPRESSION: 1. No evidence of pulmonary embolus.
2. Mild focal opacity at the right lung base raises question for
mild pneumonia. Minimal left basilar atelectasis seen.
3. Cardiomegaly noted. Mild aneurysmal dilatation of the distal
aortic arch, measuring up to 3.7 cm in diameter, which resolves
along the descending thoracic aorta. Recommend annual imaging
followup by CTA or MRA. This recommendation follows 4595
ACCF/AHA/AATS/ACR/ASA/SCA/KOICHI/JUMPER/RTOYOTA/JOASIA Guidelines for the
Diagnosis and Management of Patients with Thoracic Aortic Disease.
Circulation.4595; 121: [id number]
4. Severe bilateral renal atrophy noted.

## 2017-01-21 DIAGNOSIS — J811 Chronic pulmonary edema: Secondary | ICD-10-CM | POA: Diagnosis not present

## 2017-01-21 DIAGNOSIS — N186 End stage renal disease: Secondary | ICD-10-CM | POA: Diagnosis not present

## 2017-01-21 DIAGNOSIS — Z79899 Other long term (current) drug therapy: Secondary | ICD-10-CM | POA: Diagnosis not present

## 2017-01-21 DIAGNOSIS — I517 Cardiomegaly: Secondary | ICD-10-CM | POA: Diagnosis not present

## 2017-01-21 DIAGNOSIS — J90 Pleural effusion, not elsewhere classified: Secondary | ICD-10-CM | POA: Diagnosis not present

## 2017-01-21 DIAGNOSIS — R9431 Abnormal electrocardiogram [ECG] [EKG]: Secondary | ICD-10-CM | POA: Diagnosis not present

## 2017-01-21 DIAGNOSIS — Z888 Allergy status to other drugs, medicaments and biological substances status: Secondary | ICD-10-CM | POA: Diagnosis not present

## 2017-01-21 DIAGNOSIS — I120 Hypertensive chronic kidney disease with stage 5 chronic kidney disease or end stage renal disease: Secondary | ICD-10-CM | POA: Diagnosis not present

## 2017-01-21 DIAGNOSIS — M25511 Pain in right shoulder: Secondary | ICD-10-CM | POA: Diagnosis not present

## 2017-01-21 DIAGNOSIS — R0789 Other chest pain: Secondary | ICD-10-CM | POA: Diagnosis not present

## 2017-01-21 DIAGNOSIS — Z886 Allergy status to analgesic agent status: Secondary | ICD-10-CM | POA: Diagnosis not present

## 2017-01-21 DIAGNOSIS — R079 Chest pain, unspecified: Secondary | ICD-10-CM | POA: Diagnosis not present

## 2017-01-21 DIAGNOSIS — Z992 Dependence on renal dialysis: Secondary | ICD-10-CM | POA: Diagnosis not present

## 2017-01-21 DIAGNOSIS — E1122 Type 2 diabetes mellitus with diabetic chronic kidney disease: Secondary | ICD-10-CM | POA: Diagnosis not present

## 2017-01-22 DIAGNOSIS — N2581 Secondary hyperparathyroidism of renal origin: Secondary | ICD-10-CM | POA: Diagnosis not present

## 2017-01-22 DIAGNOSIS — N186 End stage renal disease: Secondary | ICD-10-CM | POA: Diagnosis not present

## 2017-01-22 DIAGNOSIS — D631 Anemia in chronic kidney disease: Secondary | ICD-10-CM | POA: Diagnosis not present

## 2017-01-24 DIAGNOSIS — N186 End stage renal disease: Secondary | ICD-10-CM | POA: Diagnosis not present

## 2017-01-24 DIAGNOSIS — N2581 Secondary hyperparathyroidism of renal origin: Secondary | ICD-10-CM | POA: Diagnosis not present

## 2017-01-24 DIAGNOSIS — D631 Anemia in chronic kidney disease: Secondary | ICD-10-CM | POA: Diagnosis not present

## 2017-01-24 MED FILL — Medication: Qty: 1 | Status: AC

## 2017-01-25 ENCOUNTER — Emergency Department (HOSPITAL_COMMUNITY)
Admission: EM | Admit: 2017-01-25 | Discharge: 2017-01-25 | Disposition: A | Discharge status: Home / Self Care | Payer: Medicare Other | Attending: Emergency Medicine | Admitting: Emergency Medicine

## 2017-01-25 ENCOUNTER — Encounter (HOSPITAL_COMMUNITY): Payer: Self-pay | Admitting: Emergency Medicine

## 2017-01-25 ENCOUNTER — Emergency Department (HOSPITAL_COMMUNITY): Payer: Medicare Other

## 2017-01-25 DIAGNOSIS — Z79899 Other long term (current) drug therapy: Secondary | ICD-10-CM | POA: Insufficient documentation

## 2017-01-25 DIAGNOSIS — R079 Chest pain, unspecified: Secondary | ICD-10-CM | POA: Diagnosis not present

## 2017-01-25 DIAGNOSIS — Z992 Dependence on renal dialysis: Secondary | ICD-10-CM | POA: Insufficient documentation

## 2017-01-25 DIAGNOSIS — R072 Precordial pain: Secondary | ICD-10-CM | POA: Diagnosis not present

## 2017-01-25 DIAGNOSIS — Z87891 Personal history of nicotine dependence: Secondary | ICD-10-CM | POA: Insufficient documentation

## 2017-01-25 DIAGNOSIS — E119 Type 2 diabetes mellitus without complications: Secondary | ICD-10-CM | POA: Diagnosis not present

## 2017-01-25 DIAGNOSIS — I5042 Chronic combined systolic (congestive) and diastolic (congestive) heart failure: Secondary | ICD-10-CM | POA: Insufficient documentation

## 2017-01-25 DIAGNOSIS — I120 Hypertensive chronic kidney disease with stage 5 chronic kidney disease or end stage renal disease: Secondary | ICD-10-CM | POA: Insufficient documentation

## 2017-01-25 DIAGNOSIS — I110 Hypertensive heart disease with heart failure: Secondary | ICD-10-CM | POA: Insufficient documentation

## 2017-01-25 DIAGNOSIS — R0789 Other chest pain: Secondary | ICD-10-CM | POA: Diagnosis not present

## 2017-01-25 DIAGNOSIS — N186 End stage renal disease: Secondary | ICD-10-CM | POA: Diagnosis not present

## 2017-01-25 DIAGNOSIS — Z86718 Personal history of other venous thrombosis and embolism: Secondary | ICD-10-CM | POA: Diagnosis not present

## 2017-01-25 LAB — CBC WITH DIFFERENTIAL/PLATELET
Basophils Absolute: 0 10*3/uL (ref 0.0–0.1)
Basophils Relative: 0 %
EOS ABS: 0.5 10*3/uL (ref 0.0–0.7)
Eosinophils Relative: 7 %
HEMATOCRIT: 33.9 % — AB (ref 39.0–52.0)
HEMOGLOBIN: 10.7 g/dL — AB (ref 13.0–17.0)
LYMPHS ABS: 1.3 10*3/uL (ref 0.7–4.0)
Lymphocytes Relative: 19 %
MCH: 26.6 pg (ref 26.0–34.0)
MCHC: 31.6 g/dL (ref 30.0–36.0)
MCV: 84.1 fL (ref 78.0–100.0)
MONO ABS: 0.7 10*3/uL (ref 0.1–1.0)
MONOS PCT: 10 %
NEUTROS PCT: 64 %
Neutro Abs: 4.3 10*3/uL (ref 1.7–7.7)
Platelets: 204 10*3/uL (ref 150–400)
RBC: 4.03 MIL/uL — ABNORMAL LOW (ref 4.22–5.81)
RDW: 17.9 % — AB (ref 11.5–15.5)
WBC: 6.6 10*3/uL (ref 4.0–10.5)

## 2017-01-25 LAB — COMPREHENSIVE METABOLIC PANEL
ALBUMIN: 3.4 g/dL — AB (ref 3.5–5.0)
ALT: 9 U/L — ABNORMAL LOW (ref 17–63)
ANION GAP: 15 (ref 5–15)
AST: 30 U/L (ref 15–41)
Alkaline Phosphatase: 148 U/L — ABNORMAL HIGH (ref 38–126)
BUN: 35 mg/dL — AB (ref 6–20)
CHLORIDE: 99 mmol/L — AB (ref 101–111)
CO2: 26 mmol/L (ref 22–32)
Calcium: 8.3 mg/dL — ABNORMAL LOW (ref 8.9–10.3)
Creatinine, Ser: 10.1 mg/dL — ABNORMAL HIGH (ref 0.61–1.24)
GFR calc Af Amer: 6 mL/min — ABNORMAL LOW (ref >60)
GFR, EST NON AFRICAN AMERICAN: 5 mL/min — AB (ref >60)
GLUCOSE: 89 mg/dL (ref 65–99)
POTASSIUM: 4.4 mmol/L (ref 3.5–5.1)
Sodium: 140 mmol/L (ref 135–145)
Total Bilirubin: 0.7 mg/dL (ref 0.3–1.2)
Total Protein: 7.7 g/dL (ref 6.5–8.1)

## 2017-01-25 LAB — I-STAT CHEM 8, ED
BUN: 37 mg/dL — AB (ref 6–20)
CALCIUM ION: 0.88 mmol/L — AB (ref 1.15–1.40)
CHLORIDE: 99 mmol/L — AB (ref 101–111)
CREATININE: 10.7 mg/dL — AB (ref 0.61–1.24)
Glucose, Bld: 89 mg/dL (ref 65–99)
HCT: 36 % — ABNORMAL LOW (ref 39.0–52.0)
Hemoglobin: 12.2 g/dL — ABNORMAL LOW (ref 13.0–17.0)
Potassium: 4.4 mmol/L (ref 3.5–5.1)
SODIUM: 140 mmol/L (ref 135–145)
TCO2: 28 mmol/L (ref 0–100)

## 2017-01-25 LAB — LIPASE, BLOOD: LIPASE: 40 U/L (ref 11–51)

## 2017-01-25 LAB — TROPONIN I: Troponin I: 0.05 ng/mL (ref <0.03)

## 2017-01-25 MED ORDER — METOCLOPRAMIDE HCL 5 MG/ML IJ SOLN
5.0000 mg | Freq: Once | INTRAMUSCULAR | Status: AC
  Administered 2017-01-25: 5 mg via INTRAVENOUS
  Filled 2017-01-25: qty 2

## 2017-01-25 MED ORDER — METOCLOPRAMIDE HCL 10 MG PO TABS
5.0000 mg | ORAL_TABLET | Freq: Once | ORAL | Status: AC
  Administered 2017-01-25: 5 mg via ORAL
  Filled 2017-01-25: qty 1

## 2017-01-25 MED ORDER — AMLODIPINE BESYLATE 5 MG PO TABS
10.0000 mg | ORAL_TABLET | Freq: Once | ORAL | Status: AC
  Administered 2017-01-25: 10 mg via ORAL
  Filled 2017-01-25: qty 2

## 2017-01-25 MED ORDER — CLONIDINE HCL 0.3 MG/24HR TD PTWK
0.3000 mg | MEDICATED_PATCH | TRANSDERMAL | Status: DC
  Administered 2017-01-25: 0.3 mg via TRANSDERMAL
  Filled 2017-01-25: qty 1

## 2017-01-25 MED ORDER — HYDRALAZINE HCL 25 MG PO TABS
100.0000 mg | ORAL_TABLET | Freq: Once | ORAL | Status: AC
  Administered 2017-01-25: 100 mg via ORAL
  Filled 2017-01-25: qty 4

## 2017-01-25 MED ORDER — METOCLOPRAMIDE HCL 10 MG PO TABS
5.0000 mg | ORAL_TABLET | Freq: Once | ORAL | Status: DC
  Filled 2017-01-25: qty 1

## 2017-01-25 NOTE — ED Triage Notes (Signed)
Pt c/o aching mid, lower cp 9/10 with N&V that started 2 days ago. Pt denies SOB. Hx HTN and dialysis MWF. Pt reports he has been unable to take HTN medication d/t vomiting.

## 2017-01-25 NOTE — ED Notes (Signed)
Phlebotomy attempted to collect an Istat Troponin and could not get it the first time, the second time he refused the have the lab drawn.

## 2017-01-25 NOTE — ED Notes (Signed)
Patient is alert and orientedx4.  Patient was explained discharge instructions and they understood them with no questions.

## 2017-01-25 NOTE — ED Notes (Signed)
ED Provider at bedside. 

## 2017-01-25 NOTE — ED Provider Notes (Signed)
review is  [facility] DEPT Provider Note   CSN: [id number] Arrival date & time: 01/25/17  0932   History   Chief Complaint Chief Complaint  Patient presents with  . Chest Pain  . Emesis    HPI Frank [name] is a 51 y.o. male with a h/o of ESRD and combined systolic and diastolic CHF who presents to the emergency department with constant midsternal chest pain that began 2 days ago and radiates down to his abdomen, which he describes as aching. He also reports nausea, diaphoresis, and 7-8 episodes of non-bilious emesis over the last day. No diarrhea, abdominal pain, fever, chills, leg swelling, or dyspnea.   On hemodialysis M/W/F, last dialyzed yesterday. He reports he has not taken his home meds, including Reglan and BP meds, due to emesis.   The history is provided by the patient. No language interpreter was used.    Past Medical History:  Diagnosis Date  . Anemia   . Anxiety   . Chronic combined systolic and diastolic CHF (congestive heart failure) (HCC)    a. EF 20-25% by echo in 08/2015 b. echo 10/2015: EF 35-40%, diffuse HK, severe LAE, moderate RAE, small pericardial effusion  . Complication of anesthesia    itching, sore throat  . Depression   . Dialysis patient ([place])   . ESRD (end stage renal disease) ([place])    due to HTN per patient, followed at [facility], s/p failed kidney transplant - dialysis Tue, Th, Sat  . Hyperkalemia 12/2015  . Hypertension   . Junctional rhythm    a. noted in 08/2015: hyperkalemic at that time  b. 12/2015: presented in junctional rhythm w/ K+ of 6.6. Resolved with improvement of K+ levels.  . Nonischemic cardiomyopathy ([place])    a. 08/2014: cath showing minimal CAD, but tortuous arteries noted.   . Personal history of DVT (deep vein thrombosis)/ PE 05/26/2016   In Oct 2015 had small subsemental LUL PE w/o DVT (LE dopplers neg) and was felt to be HD cath related, treated w coumadin.  IN May 2016 had small vein DVT (acute/subacute) in the R basilic/  brachial veins of the RUE, resumed on coumadin.  Had R sided HD cath at that time.    . Renal insufficiency   . Shortness of breath   . Type II diabetes mellitus (HCC)    No history per patient, but remains under history as A1c would not be accurate given on dialysis    Patient Active Problem List   Diagnosis Date Noted  . Volume overload 01/13/2017  . Aortic atherosclerosis ([place]) 01/05/2017  . Abdominal pain 08/04/2016  . Uremia 06/07/2016  . Hyperkalemia 05/29/2016  . GERD (gastroesophageal reflux disease) 05/29/2016  . Personal history of DVT (deep vein thrombosis)/ PE 05/26/2016  . Nonischemic cardiomyopathy ([place]) 01/09/2016  . Bilateral low back pain without sciatica   . Renal cyst, left 10/30/2015  . Constipation by delayed colonic transit 10/30/2015  . Acute on chronic combined systolic and diastolic congestive heart failure ([place]) 09/23/2015  . Chest pain 09/08/2015  . Adjustment disorder with mixed anxiety and depressed mood 08/20/2015  . Essential hypertension 01/02/2015  . Dyslipidemia   . Malignant hypertension 11/29/2014  . ESRD on hemodialysis ([place])   . Acute pulmonary edema (HCC)   . DM (diabetes mellitus), type 2, uncontrolled, with renal complications ([place])   . Complex sleep apnea syndrome 05/05/2014  . Anemia of chronic kidney failure 06/24/2013    Past Surgical History:  Procedure Laterality Date  .  CAPD INSERTION    . CAPD REMOVAL    . INGUINAL HERNIA REPAIR Right 02/14/2015   Procedure: REPAIR INCARCERATED RIGHT INGUINAL HERNIA;  Surgeon: [name], MD;  Location: [facility];  Service: General;  Laterality: Right;  . INSERTION OF DIALYSIS CATHETER Right 09/23/2015   Procedure: exchange of Right internal Dialysis Catheter.;  Surgeon: [name], MD;  Location: [facility];  Service: Vascular;  Laterality: Right;  . IR GENERIC HISTORICAL  07/16/2016   IR US GUIDE VASC ACCESS LEFT 07/16/2016 Corrie Mckusick, DO [facility]  . IR GENERIC HISTORICAL Left 07/16/2016     IR THROMBECTOMY AV FISTULA W/THROMBOLYSIS/PTA INC/SHUNT/IMG LEFT 07/16/2016 Corrie Mckusick, DO [facility]  . KIDNEY RECEIPIENT  2006   failed and started HD in March 2014  . LEFT HEART CATHETERIZATION WITH CORONARY ANGIOGRAM N/A 09/02/2014   Procedure: LEFT HEART CATHETERIZATION WITH CORONARY ANGIOGRAM;  Surgeon: [name], MD;  Location: [facility] C[facility] LAB;  Service: Cardiovascular;  Laterality: N/A;       Home Medications    Prior to Admission medications   Medication Sig Start Date End Date Taking? Authorizing Provider  amLODipine (NORVASC) 10 MG tablet Take 1 tablet (10 mg total) by mouth at bedtime. 12/01/16  Yes Rice, Resa Miner, MD  cloNIDine (CATAPRES - DOSED IN MG/24 HR) 0.3 mg/24hr patch Place onto the skin once a week. On Wednesday 12/31/16  Yes [provider]  hydrALAZINE (APRESOLINE) 100 MG tablet Take 1 tablet (100 mg total) by mouth 2 (two) times daily. 01/07/17  Yes Ledell Noss, MD  isosorbide mononitrate (IMDUR) 60 MG 24 hr tablet Take 1 tablet (60 mg total) by mouth daily. 01/08/17  Yes Ledell Noss, MD  metoCLOPramide (REGLAN) 5 MG tablet Take 1 tablet (5 mg total) by mouth every 8 (eight) hours as needed for nausea. 12/01/16  Yes Rice, Resa Miner, MD  pantoprazole (PROTONIX) 40 MG tablet Take 1 tablet (40 mg total) by mouth 2 (two) times daily before a meal. 12/01/16  Yes Rice, Resa Miner, MD  sevelamer carbonate (RENVELA) 800 MG tablet Take 1 tablet (800 mg total) by mouth 3 (three) times daily with meals. Patient taking differently: Take 800 mg by mouth 3 (three) times daily with meals. 800 mg as needed with snack 01/06/17  Yes Ledell Noss, MD  traMADol (ULTRAM) 50 MG tablet Take 1 tablet (50 mg total) by mouth every 12 (twelve) hours as needed for moderate pain or severe pain. 12/01/16  Yes Rice, Resa Miner, MD    Family History Family History  Problem Relation Age of Onset  . Hypertension Other     Social History Social History  Substance Use  Topics  . Smoking status: Former Smoker    Packs/day: 0.00    Years: 1.00    Types: Cigarettes  . Smokeless tobacco: Never Used     Comment: quit Jan 2014  . Alcohol use No     Allergies   Butalbital-apap-caffeine; Ferrlecit [na ferric gluc cplx in sucrose]; Minoxidil; Tylenol [acetaminophen]; and Darvocet [propoxyphene n-acetaminophen]   Review of Systems Review of Systems  Constitutional: Negative for activity change, chills, diaphoresis and fever.  Respiratory: Negative for shortness of breath.   Cardiovascular: Positive for chest pain. Negative for leg swelling.  Gastrointestinal: Positive for nausea and vomiting. Negative for abdominal pain and diarrhea.  Musculoskeletal: Negative for back pain.  Skin: Negative for rash.     Physical Exam Updated Vital Signs BP (!) 198/148   Pulse 84   Temp  98.6 F (37 C) (Oral)   Resp 12   Ht _0  (1.88 m)   Wt 74.8 kg (165 lb)   SpO2 99%   BMI 21.18 kg/m   Physical Exam  Constitutional: He appears well-developed. He is uncooperative.  Non-toxic appearance. No distress.  Awake, alert, and sitting upright in bed. No diaphoresis.  HENT:  Head: Normocephalic.  Mucous membranes moist.   Eyes: Conjunctivae are normal.  Neck: Neck supple.  Cardiovascular: Normal rate, regular rhythm and normal heart sounds.  Exam reveals no gallop and no friction rub.   No murmur heard. Pulmonary/Chest: Effort normal.  CTAB. No noted rhonchi, rales, or wheezes.   Abdominal: Soft. He exhibits no distension. There is no tenderness. There is no rebound.  Musculoskeletal: He exhibits no tenderness.  No lower extremity edema.   Neurological: He is alert.  Speaks in clear, goal-oriented sentences.   Skin: Skin is warm and dry. Capillary refill takes less than 2 seconds. He is not diaphoretic.  Psychiatric: He is agitated.  Nursing note and vitals reviewed.  ED Treatments / Results  Labs (all labs ordered are listed, but only abnormal results  are displayed) Labs Reviewed  CBC WITH DIFFERENTIAL/PLATELET - Abnormal; Notable for the following:       Result Value   RBC 4.03 (*)    Hemoglobin 10.7 (*)    HCT 33.9 (*)    RDW 17.9 (*)    All other components within normal limits  COMPREHENSIVE METABOLIC PANEL - Abnormal; Notable for the following:    Chloride 99 (*)    BUN 35 (*)    Creatinine, Ser 10.10 (*)    Calcium 8.3 (*)    Albumin 3.4 (*)    ALT 9 (*)    Alkaline Phosphatase 148 (*)    GFR calc non Af Amer 5 (*)    GFR calc Af Amer 6 (*)    All other components within normal limits  TROPONIN I - Abnormal; Notable for the following:    Troponin I 0.05 (*)    All other components within normal limits  I-STAT CHEM 8, ED - Abnormal; Notable for the following:    Chloride 99 (*)    BUN 37 (*)    Creatinine, Ser 10.70 (*)    Calcium, Ion 0.88 (*)    Hemoglobin 12.2 (*)    HCT 36.0 (*)    All other components within normal limits  LIPASE, BLOOD    EKG  EKG Interpretation  Date/Time:  Saturday January 25 2017 06:24:38 EDT Ventricular Rate:  93 PR Interval:    QRS Duration: 94 QT Interval:  434 QTC Calculation: 540 R Axis:   3 Text Interpretation:  Sinus rhythm Probable left atrial enlargement LVH with secondary repolarization abnormality Anterior Q waves, possibly due to LVH Prolonged QT interval Baseline wander in lead(s) V2 Rate faster lateral ST depressions prolongedQT Confirmed by Ezequiel Essex [phone]) on 01/25/2017 6:30:13 AM Also confirmed by Ezequiel Essex [phone]), editor Drema Pry [phone])  on 01/25/2017 9:21:07 AM       Radiology No results found.  Procedures Procedures (including critical care time)  Medications Ordered in ED Medications  metoCLOPramide (REGLAN) injection 5 mg (5 mg Intravenous Given 01/25/17 0811)  metoCLOPramide (REGLAN) tablet 5 mg (5 mg Oral Given 01/25/17 1123)  hydrALAZINE (APRESOLINE) tablet 100 mg (100 mg Oral Given 01/25/17 1136)  amLODipine (NORVASC) tablet 10 mg  (10 mg Oral Given 01/25/17 1136)     Initial Impression / Assessment  and Plan / ED Course  I have reviewed the triage vital signs and the nursing notes.  Pertinent labs & imaging results that were available during my care of the patient were reviewed by me and considered in my medical decision making (see chart for details).  - 06:45- Ordered patient's home dose of PO Reglan for nausea. Patient is refusing CXR until IV Reglan is given.   - 9:00- Patient is requesting pain medicine. Tylenol offered, which he declined.   - 11:00- Patient is refusing second Trop and asking to be discharged. Discussed with the patient that his first Trop was slightly elevated at 0.05, but the patient refuses.  - 11:15 Notified by nurse patient's BP elevated to 201/140. Will give patient's home hydralazine and Norvasc.  - 11:20 Patient reports he has lost his remaining clonidine patches and the pharmacy will not fill his medicine because it's too soon. Will provide the patch with a new patch.     Patient with a h/o of ESRD (M/W/F), DM Type II, HTN, and combined systolic and diastolic CHF presenting with CP, N/V x2 days. History is somewhat limited d/t patient refusing to answer questions at times. Pt was recently admitted for hyperkalemia on 6/28 and left AMA on 6/29. Of note, the patient had two prior admissions in June requiring inpatient hemodialysis. Last dialyzed yesterday. K 4.4 BUN/Cr  35/10.10. Initial trop 0.05. Although CXR demonstrating mild edema, patient is in NAD and SAO2 99% on RA. Minimal change from CXR on 6/24. EKG is unchanged from previous. No clinical signs of dehydration secondary to emesis and the patient has not vomitted since arriving to the ED.   He has not taken any of his home meds this morning and BP 190-200s/100-140s. Likely secondary to not taking his AM BP meds. Home hydralazine and Norvasc given in the ED. Clonidine patch replaced in the ED.   Patient refused second trop and is  requesting to be discharged. Given the patient's CP began ~48 hours ago and initial trop 0.05 and unchanged EKG changed, second troponin may be unnecessary given the patient's refusal.   Discussed and evaluated the patient with Dr. Leonette Monarch, attending physician. Will d/c the patient to home to continued with hemodialysis. Encouraged compliance with home meds and follow up to nephrology and cardiology. Strict return precautions given. The patient is safe and stable for d/c at this time.    Final Clinical Impressions(s) / ED Diagnoses   Final diagnoses:  Atypical chest pain    New Prescriptions Discharge Medication List as of 01/25/2017 11:16 AM       Joline Maxcy A, PA-C 01/27/17 1309    Cardama, Grayce Sessions, MD 01/30/17 0000

## 2017-01-25 NOTE — ED Notes (Signed)
CRITICAL VALUE ALERT  Critical Value:  Troponin 0.05  Date & Time Notied:  0731  Provider Notified: Dr. Leonette Monarch  Orders Received/Actions taken: no given

## 2017-01-25 NOTE — Discharge Instructions (Signed)
Please continue to follow your hemodialysis schedule. Please continue to take all of your home medications as directed. Please follow-up with your primary care provider if your symptoms persist. If he develop worsening symptoms, please return to the emergency department for reevaluation.

## 2017-01-26 DIAGNOSIS — Z79899 Other long term (current) drug therapy: Secondary | ICD-10-CM | POA: Diagnosis not present

## 2017-01-26 DIAGNOSIS — R079 Chest pain, unspecified: Secondary | ICD-10-CM | POA: Diagnosis not present

## 2017-01-26 DIAGNOSIS — N261 Atrophy of kidney (terminal): Secondary | ICD-10-CM | POA: Diagnosis not present

## 2017-01-26 DIAGNOSIS — Z886 Allergy status to analgesic agent status: Secondary | ICD-10-CM | POA: Diagnosis not present

## 2017-01-26 DIAGNOSIS — I517 Cardiomegaly: Secondary | ICD-10-CM | POA: Diagnosis not present

## 2017-01-26 DIAGNOSIS — N398 Other specified disorders of urinary system: Secondary | ICD-10-CM | POA: Diagnosis not present

## 2017-01-26 DIAGNOSIS — R0789 Other chest pain: Secondary | ICD-10-CM | POA: Diagnosis not present

## 2017-01-26 DIAGNOSIS — Z992 Dependence on renal dialysis: Secondary | ICD-10-CM | POA: Diagnosis not present

## 2017-01-26 DIAGNOSIS — Z888 Allergy status to other drugs, medicaments and biological substances status: Secondary | ICD-10-CM | POA: Diagnosis not present

## 2017-01-26 DIAGNOSIS — I120 Hypertensive chronic kidney disease with stage 5 chronic kidney disease or end stage renal disease: Secondary | ICD-10-CM | POA: Diagnosis not present

## 2017-01-26 DIAGNOSIS — J811 Chronic pulmonary edema: Secondary | ICD-10-CM | POA: Diagnosis not present

## 2017-01-26 DIAGNOSIS — N186 End stage renal disease: Secondary | ICD-10-CM | POA: Diagnosis not present

## 2017-01-26 DIAGNOSIS — R1084 Generalized abdominal pain: Secondary | ICD-10-CM | POA: Diagnosis not present

## 2017-01-26 DIAGNOSIS — N281 Cyst of kidney, acquired: Secondary | ICD-10-CM | POA: Diagnosis not present

## 2017-01-26 DIAGNOSIS — R112 Nausea with vomiting, unspecified: Secondary | ICD-10-CM | POA: Diagnosis not present

## 2017-01-26 DIAGNOSIS — E1122 Type 2 diabetes mellitus with diabetic chronic kidney disease: Secondary | ICD-10-CM | POA: Diagnosis not present

## 2017-01-27 DIAGNOSIS — N2581 Secondary hyperparathyroidism of renal origin: Secondary | ICD-10-CM | POA: Diagnosis not present

## 2017-01-27 DIAGNOSIS — D631 Anemia in chronic kidney disease: Secondary | ICD-10-CM | POA: Diagnosis not present

## 2017-01-27 DIAGNOSIS — N186 End stage renal disease: Secondary | ICD-10-CM | POA: Diagnosis not present

## 2017-01-28 LAB — OXYCODONES,MS,WB/SP RFX
OXYCOCONE: NEGATIVE ng/mL
OXYMORPHONE: NEGATIVE ng/mL
Oxycodones Confirmation: NEGATIVE

## 2017-01-30 DIAGNOSIS — D631 Anemia in chronic kidney disease: Secondary | ICD-10-CM | POA: Diagnosis not present

## 2017-01-30 DIAGNOSIS — N2581 Secondary hyperparathyroidism of renal origin: Secondary | ICD-10-CM | POA: Diagnosis not present

## 2017-01-30 DIAGNOSIS — N186 End stage renal disease: Secondary | ICD-10-CM | POA: Diagnosis not present

## 2017-01-30 LAB — THC,MS,WB/SP RFX
Cannabidiol: NEGATIVE ng/mL
Cannabinoid Confirmation: POSITIVE
Cannabinol: NEGATIVE ng/mL
Carboxy-THC: 128.7 ng/mL
Hydroxy-THC: 7 ng/mL
Tetrahydrocannabinol(THC): 12.9 ng/mL

## 2017-01-31 IMAGING — DX DG CHEST 2V
2 series · 2 of 2 positions shown · non-contrast
Comparison: Radiographs and CT 08/30/2014

CLINICAL DATA: Chest pain.

EXAM:
CHEST  2 VIEW

[chest pa]
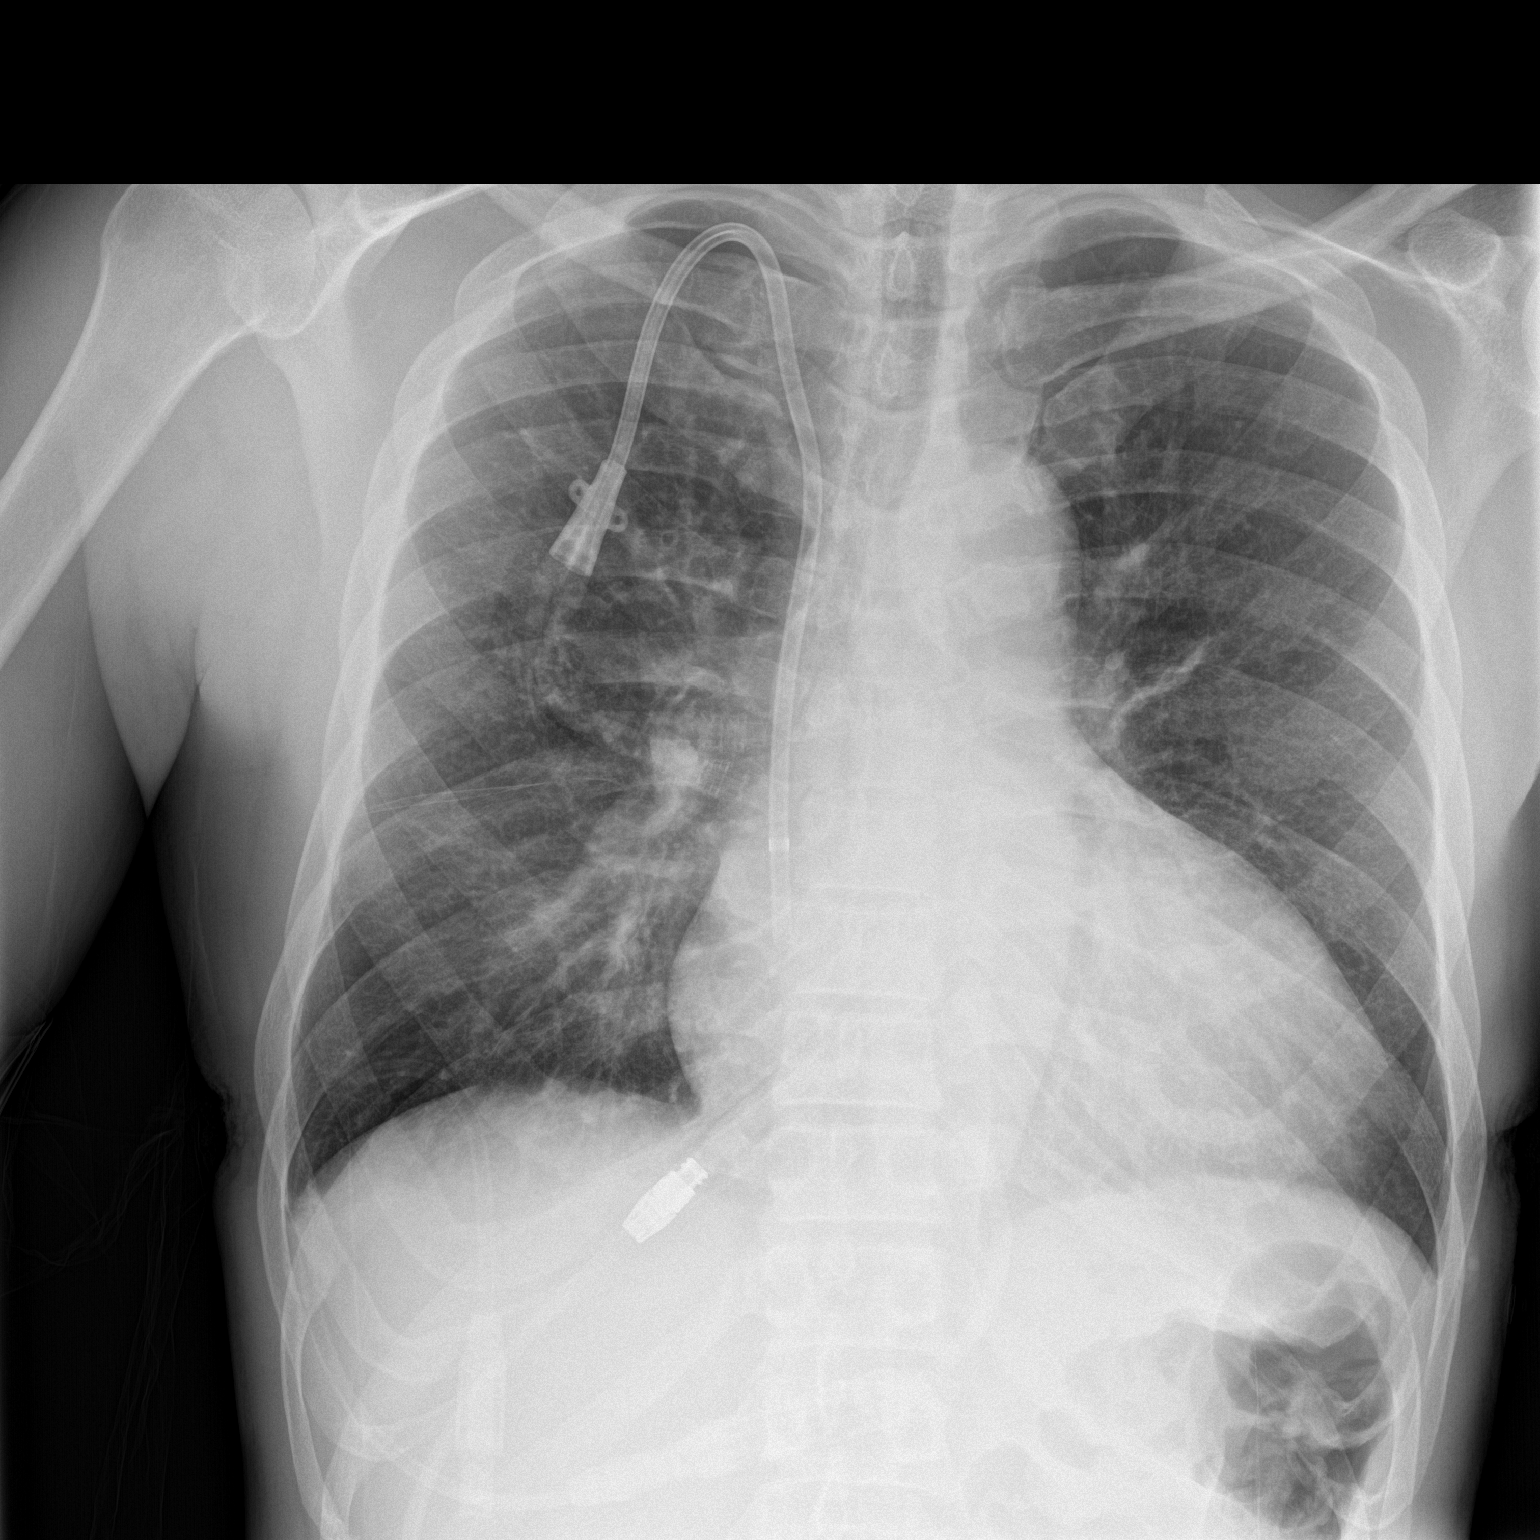

[chest lat]
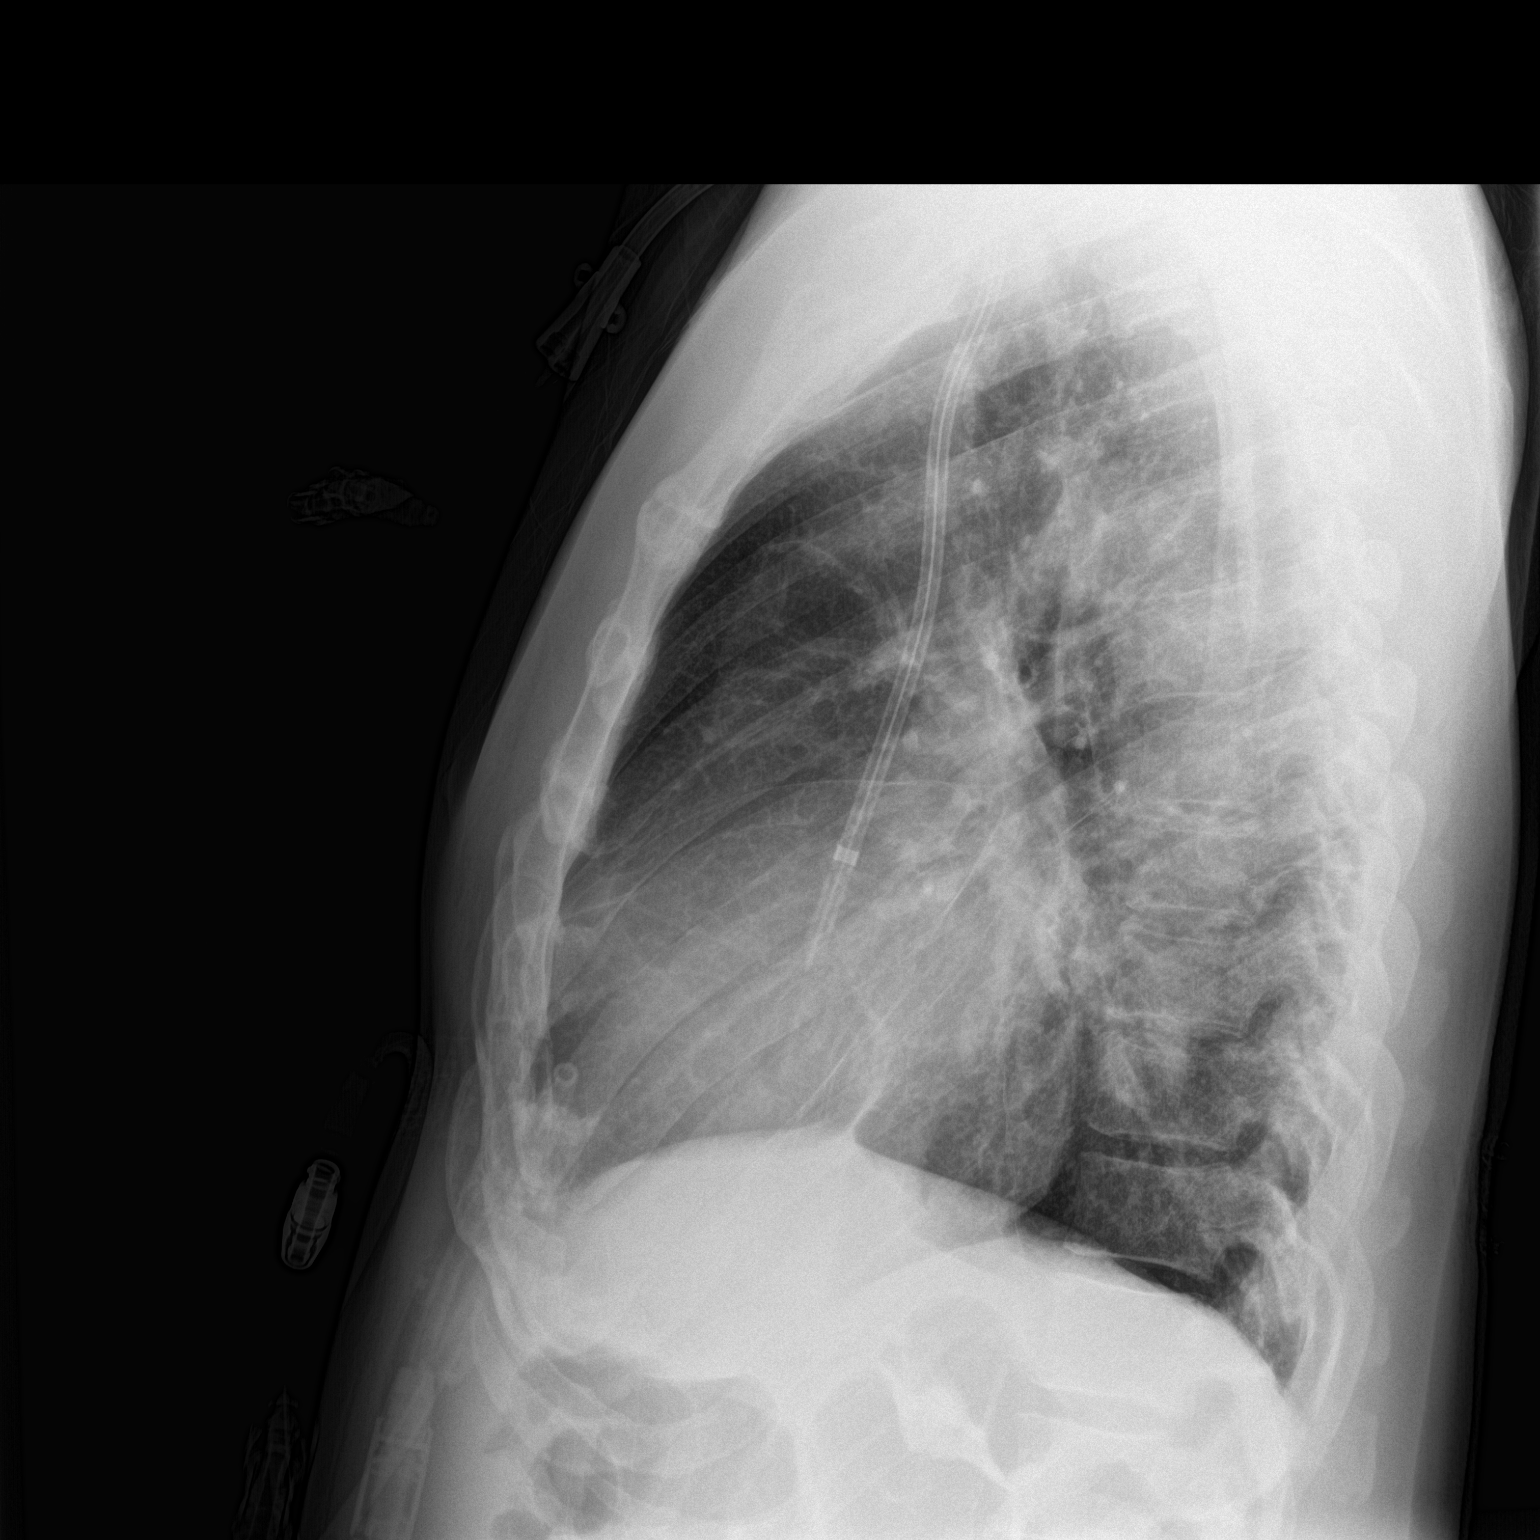

[2 of 2 positions shown; findings below may reference images not displayed]

FINDINGS: Right-sided dialysis catheter remains in place. Cardiomegaly is
stable. No pulmonary edema. There is mild peribronchial cuffing. No
consolidation to suggest pneumonia. No pleural effusion or
pneumothorax. No acute osseous abnormalities.
IMPRESSION: Mild peribronchial cuffing, may reflect bronchitis. Stable
cardiomegaly.

## 2017-02-01 DIAGNOSIS — D631 Anemia in chronic kidney disease: Secondary | ICD-10-CM | POA: Diagnosis not present

## 2017-02-01 DIAGNOSIS — N2581 Secondary hyperparathyroidism of renal origin: Secondary | ICD-10-CM | POA: Diagnosis not present

## 2017-02-01 DIAGNOSIS — N186 End stage renal disease: Secondary | ICD-10-CM | POA: Diagnosis not present

## 2017-02-03 DIAGNOSIS — D631 Anemia in chronic kidney disease: Secondary | ICD-10-CM | POA: Diagnosis not present

## 2017-02-03 DIAGNOSIS — N186 End stage renal disease: Secondary | ICD-10-CM | POA: Diagnosis not present

## 2017-02-03 DIAGNOSIS — N2581 Secondary hyperparathyroidism of renal origin: Secondary | ICD-10-CM | POA: Diagnosis not present

## 2017-02-03 LAB — OPIATES,MS,WB/SP RFX
6-ACETYLMORPHINE: NEGATIVE
CODEINE: NEGATIVE ng/mL
Dihydrocodeine: NEGATIVE ng/mL
Hydrocodone: NEGATIVE ng/mL
Hydromorphone: NEGATIVE ng/mL
MORPHINE: NEGATIVE ng/mL
Opiate Confirmation: NEGATIVE

## 2017-02-03 LAB — DRUG SCREEN 10 W/CONF, SERUM
Amphetamines, IA: NEGATIVE ng/mL
BENZODIAZEPINES, IA: NEGATIVE ng/mL
Barbiturates, IA: NEGATIVE ug/mL
Cocaine & Metabolite, IA: NEGATIVE ng/mL
METHADONE, IA: NEGATIVE ng/mL
OPIATES, IA: NEGATIVE ng/mL
Oxycodones, IA: NEGATIVE ng/mL
PHENCYCLIDINE, IA: NEGATIVE ng/mL
Propoxyphene, IA: NEGATIVE ng/mL
THC(MARIJUANA) METABOLITE, IA: POSITIVE ng/mL

## 2017-02-04 DIAGNOSIS — D631 Anemia in chronic kidney disease: Secondary | ICD-10-CM | POA: Diagnosis not present

## 2017-02-04 DIAGNOSIS — N2581 Secondary hyperparathyroidism of renal origin: Secondary | ICD-10-CM | POA: Diagnosis not present

## 2017-02-04 DIAGNOSIS — N186 End stage renal disease: Secondary | ICD-10-CM | POA: Diagnosis not present

## 2017-02-04 DIAGNOSIS — R7989 Other specified abnormal findings of blood chemistry: Secondary | ICD-10-CM | POA: Diagnosis not present

## 2017-02-04 NOTE — Discharge Summary (Signed)
Name: Frank [name] MRN: [id number] DOB: 02-May-1966 51 y.o. PCP: Patient, [name]  Date of Admission: 01/16/2017  7:58 PM Date of Discharge: 01/16/2017, patient left against medical advice  Attending Physician: No att. providers found  Discharge Diagnosis: 1. Principal Problem:   Hyperkalemia   Discharge Medications: Allergies as of 01/17/2017      Reactions   Butalbital-apap-caffeine Shortness Of Breath, Swelling, Other (See Comments)   Swelling in throat   Ferrlecit [na Ferric Gluc Cplx In Sucrose] Shortness Of Breath, Swelling, Other (See Comments)   Swelling in throat, tolerates Venofor   Minoxidil Shortness Of Breath   Tylenol [acetaminophen] Anaphylaxis, Swelling   Darvocet [propoxyphene N-acetaminophen] Hives      Medication List    ASK your doctor about these medications   amLODipine 10 MG tablet Commonly known as:  NORVASC Take 1 tablet (10 mg total) by mouth at bedtime.   cloNIDine 0.3 mg/24hr patch Commonly known as:  CATAPRES - Dosed in mg/24 hr Place onto the skin once a week. On Wednesday   hydrALAZINE 100 MG tablet Commonly known as:  APRESOLINE Take 1 tablet (100 mg total) by mouth 2 (two) times daily.   isosorbide mononitrate 60 MG 24 hr tablet Commonly known as:  IMDUR Take 1 tablet (60 mg total) by mouth daily.   metoCLOPramide 5 MG tablet Commonly known as:  REGLAN Take 1 tablet (5 mg total) by mouth every 8 (eight) hours as needed for nausea.   pantoprazole 40 MG tablet Commonly known as:  PROTONIX Take 1 tablet (40 mg total) by mouth 2 (two) times daily before a meal.   sevelamer carbonate 800 MG tablet Commonly known as:  RENVELA Take 1 tablet (800 mg total) by mouth 3 (three) times daily with meals.   traMADol 50 MG tablet Commonly known as:  ULTRAM Take 1 tablet (50 mg total) by mouth every 12 (twelve) hours as needed for moderate pain or severe pain.       Disposition and follow-up:   FrankFrank [name] left AMA from [facility].  At the hospital follow up visit please address:  1.  Hyperkalemia - please assess any help that can be provided to ensure better dialysis compliance.   2.  Labs / imaging needed at time of follow-up: BMP, EKG  3.  Pending labs/ test needing follow-up: [facility] Course by problem list: Principal Problem:   Hyperkalemia Patient presented with palpitations, syncope, and emesis 3 days after prior discharge. He had not been to HD since the time of discharge. He had driven himself to the ED.  On arrival, patient was noted to be cool and diaphoretic with vomit on his clothing and HR of 38. BP was 107/78 with RR of 18, T 98.51F and sating 99% on RA. EKG showed junctional bradycardia with a ventricular rate of 41. Labs were significant for a potassium of 6.9. Trop was 0.03. Lactic acid 2.00. CBC with hemoglobin 9.7, down from 11.1 on discharge. Lipase was 27. In the ED he received 1 g of calcium chloride, 5 units of novolog, D50 amp, IV zofran 4 mg x 1, and 250 mL NS bolus. He was given kayexalate, calcium chloride, insulin, and glucose and taken for emergent HD the evening of admission. Potassium improved to 4. After HD he was feeling back to his usual self, he was ready to eat then left against medical advice before being seen the following morning.    Discharge  Vitals:   BP (!) 162/87 (BP Location: Right Arm)   Pulse 70   Temp 97.7 F (36.5 C) (Oral)   Resp 13   Wt 157 lb 10.1 oz (71.5 kg)   SpO2 99%   BMI 20.24 kg/m   Pertinent Labs, Studies, and Procedures:     Signed: Ledell Noss, MD 02/04/2017, 9:18 PM   Pager: [phone]

## 2017-02-06 DIAGNOSIS — N186 End stage renal disease: Secondary | ICD-10-CM | POA: Diagnosis not present

## 2017-02-06 DIAGNOSIS — N2581 Secondary hyperparathyroidism of renal origin: Secondary | ICD-10-CM | POA: Diagnosis not present

## 2017-02-06 DIAGNOSIS — R079 Chest pain, unspecified: Secondary | ICD-10-CM | POA: Diagnosis not present

## 2017-02-06 DIAGNOSIS — Z992 Dependence on renal dialysis: Secondary | ICD-10-CM | POA: Diagnosis not present

## 2017-02-06 DIAGNOSIS — N289 Disorder of kidney and ureter, unspecified: Secondary | ICD-10-CM | POA: Diagnosis not present

## 2017-02-06 DIAGNOSIS — R112 Nausea with vomiting, unspecified: Secondary | ICD-10-CM | POA: Diagnosis not present

## 2017-02-06 DIAGNOSIS — D649 Anemia, unspecified: Secondary | ICD-10-CM | POA: Diagnosis not present

## 2017-02-06 DIAGNOSIS — I120 Hypertensive chronic kidney disease with stage 5 chronic kidney disease or end stage renal disease: Secondary | ICD-10-CM | POA: Diagnosis not present

## 2017-02-06 DIAGNOSIS — R1013 Epigastric pain: Secondary | ICD-10-CM | POA: Diagnosis not present

## 2017-02-06 DIAGNOSIS — I517 Cardiomegaly: Secondary | ICD-10-CM | POA: Diagnosis not present

## 2017-02-07 IMAGING — CR DG CHEST 2V
2 series · 2 of 2 positions shown · non-contrast
Comparison: 09/12/2014

CLINICAL DATA: Chest pain

EXAM:
CHEST  2 VIEW

[w chest pa]
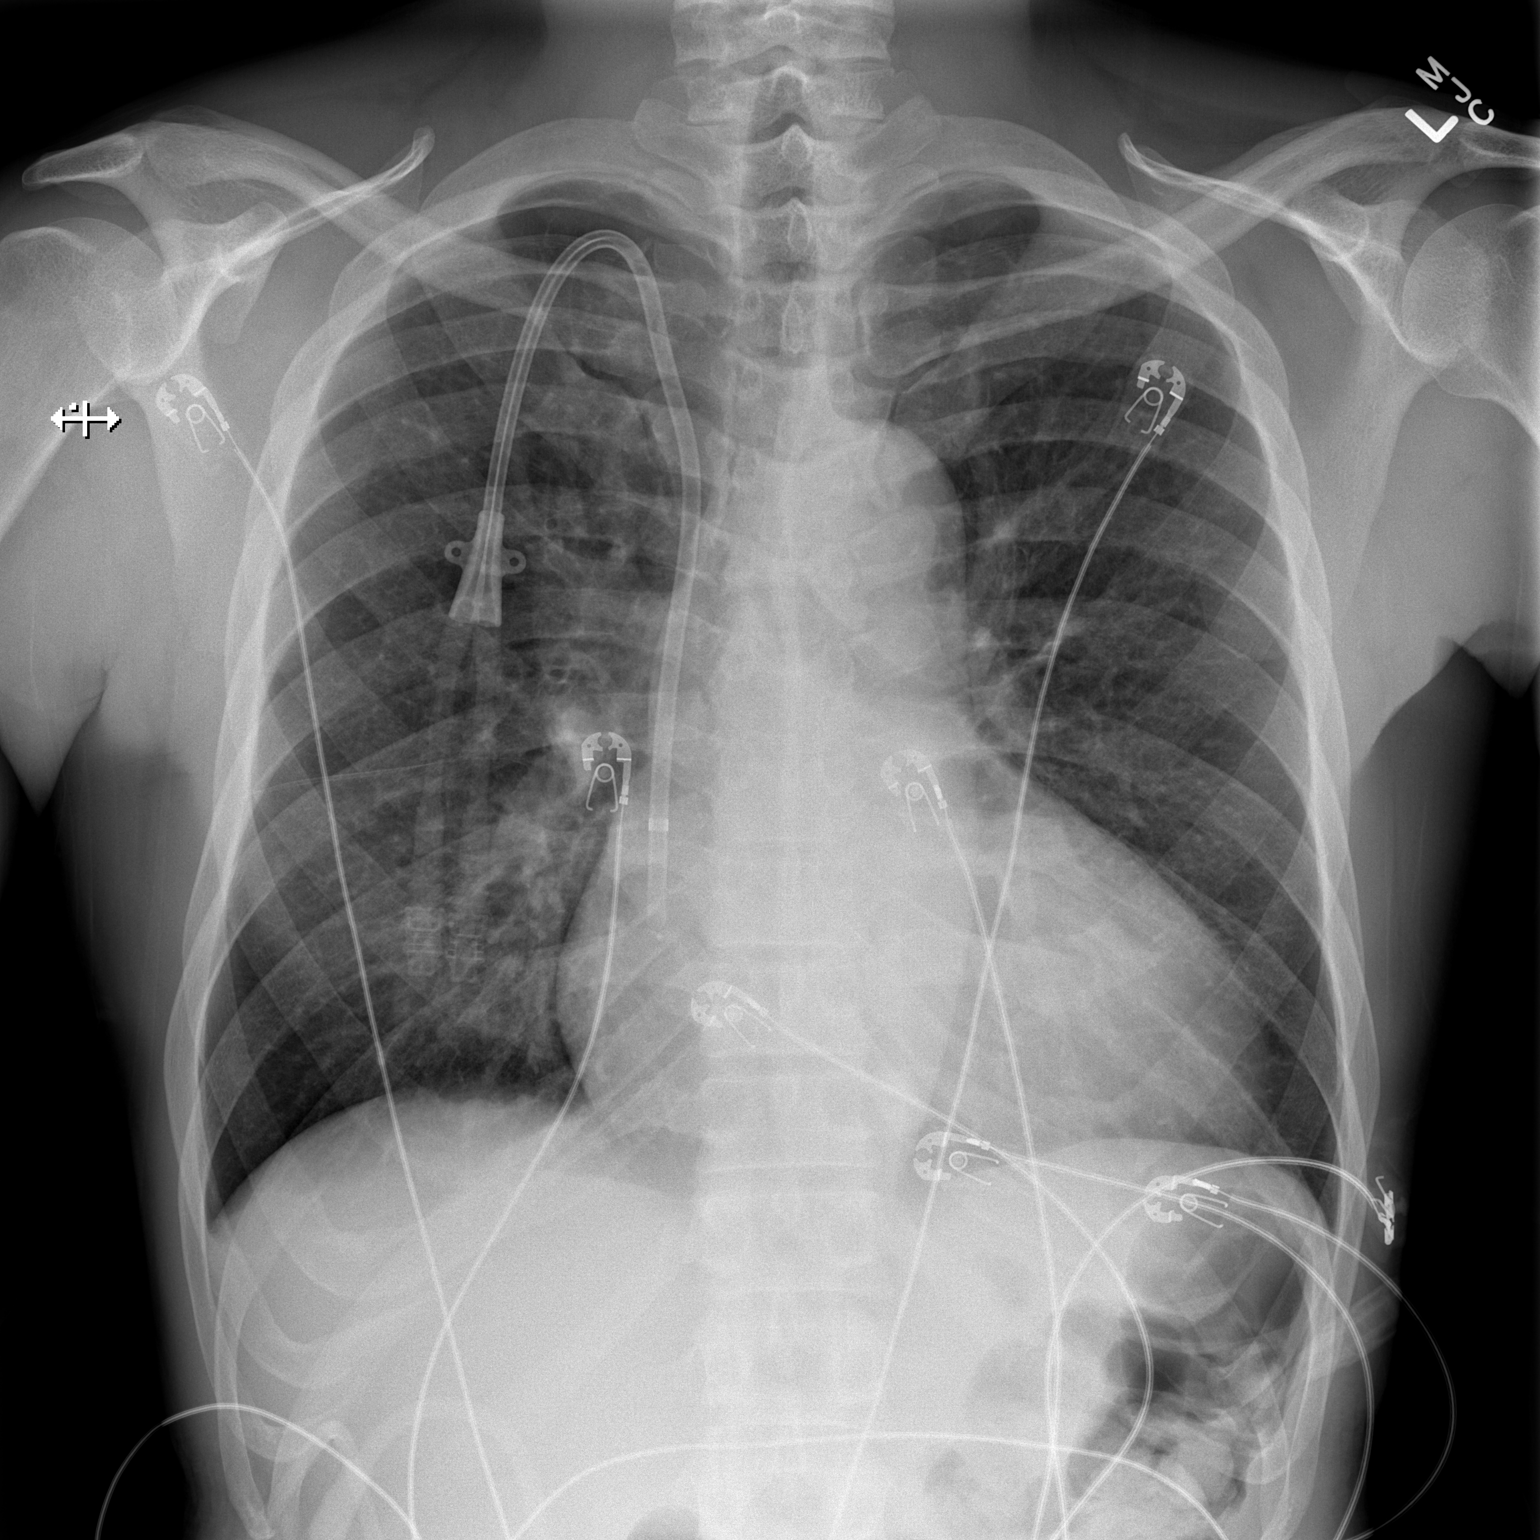

[w chest lat]
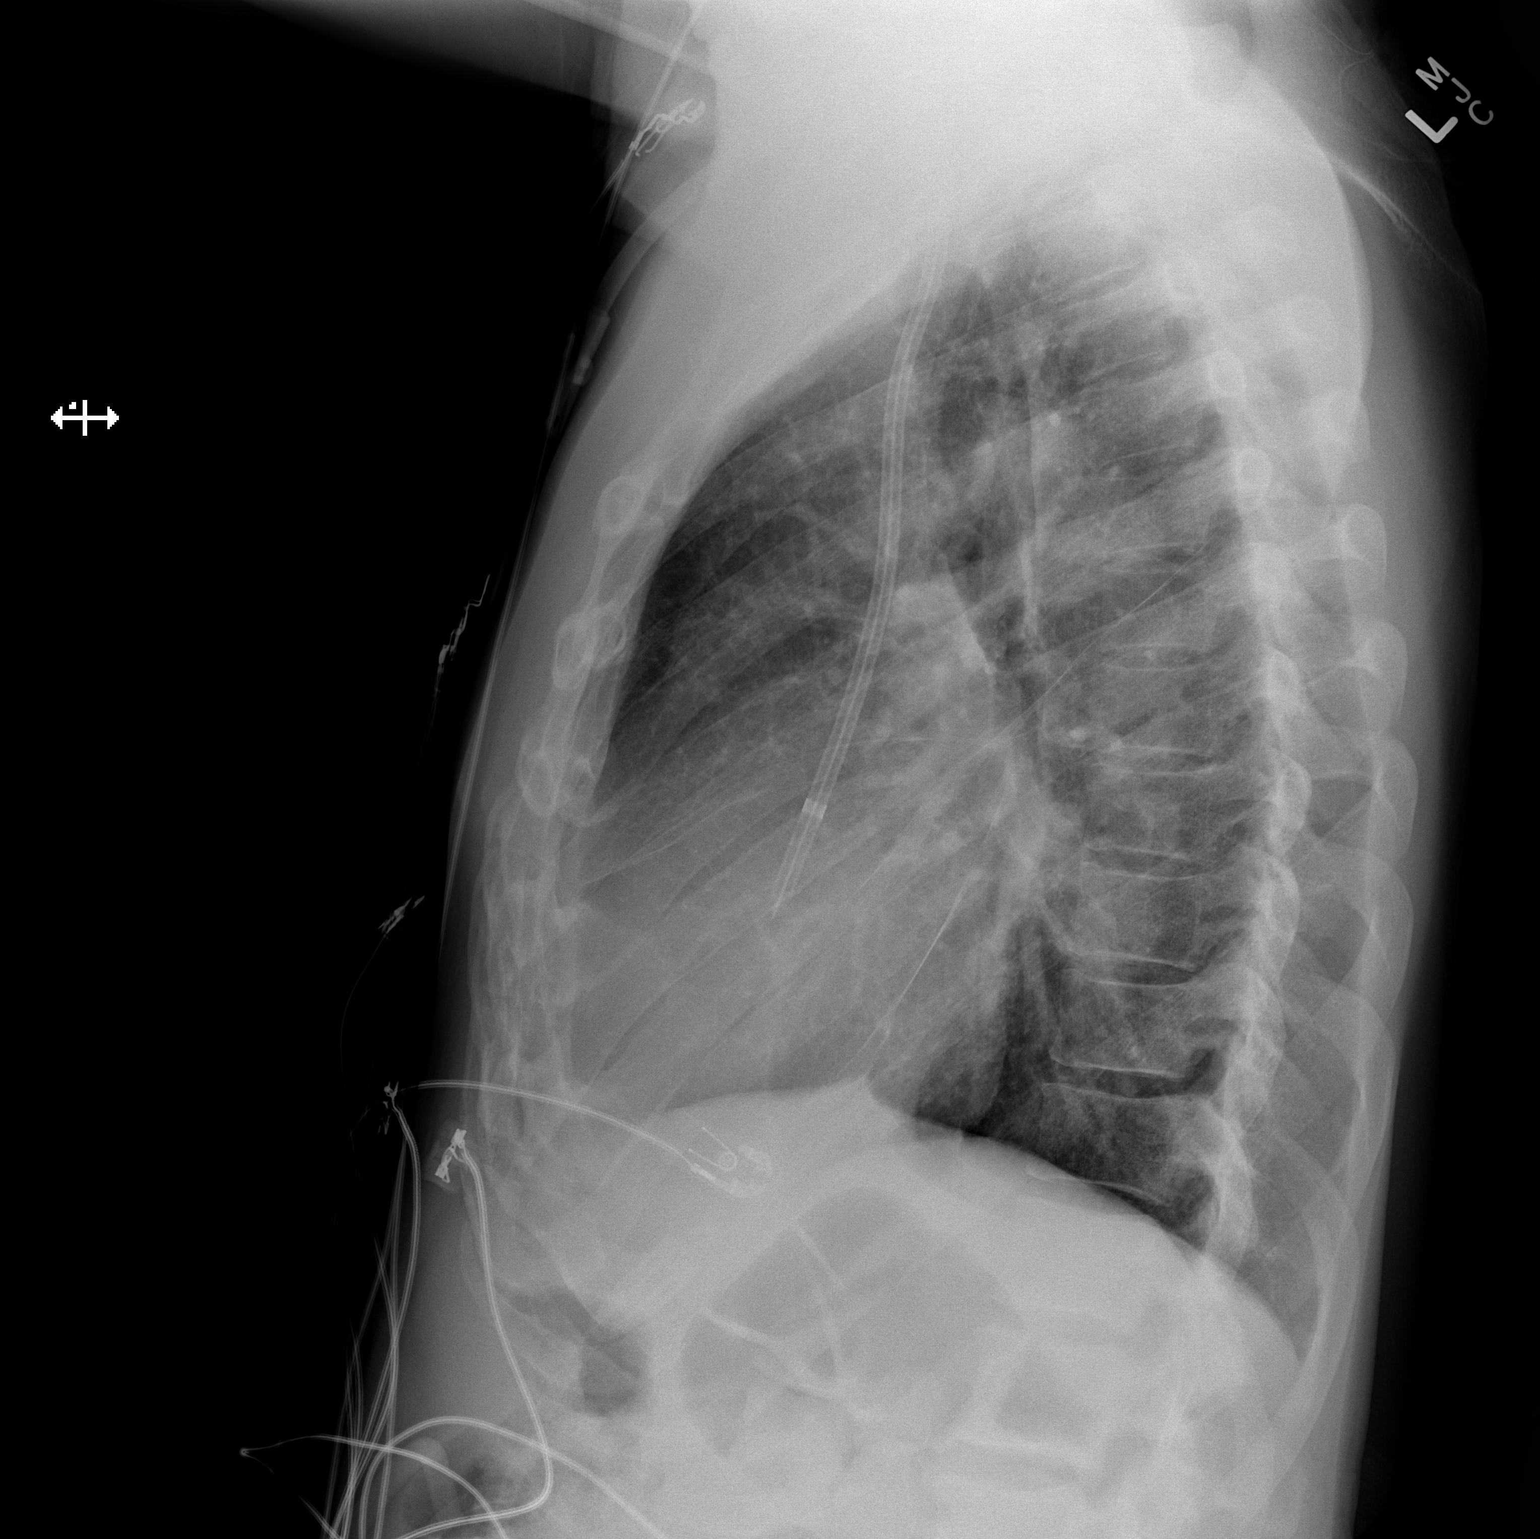

[2 of 2 positions shown; findings below may reference images not displayed]

FINDINGS: Dialysis catheter from right IJ approach is stable, tip in the upper
right atrium. Stable mild cardiomegaly and aortic tortuosity. There
is no edema, consolidation, effusion, or pneumothorax.
IMPRESSION: No active cardiopulmonary disease.

## 2017-02-09 DIAGNOSIS — N186 End stage renal disease: Secondary | ICD-10-CM | POA: Diagnosis not present

## 2017-02-09 DIAGNOSIS — R531 Weakness: Secondary | ICD-10-CM | POA: Diagnosis not present

## 2017-02-09 DIAGNOSIS — R0602 Shortness of breath: Secondary | ICD-10-CM | POA: Diagnosis not present

## 2017-02-09 DIAGNOSIS — Z992 Dependence on renal dialysis: Secondary | ICD-10-CM | POA: Diagnosis not present

## 2017-02-09 DIAGNOSIS — R112 Nausea with vomiting, unspecified: Secondary | ICD-10-CM | POA: Diagnosis not present

## 2017-02-09 DIAGNOSIS — R079 Chest pain, unspecified: Secondary | ICD-10-CM | POA: Diagnosis not present

## 2017-02-11 DIAGNOSIS — T8610 Unspecified complication of kidney transplant: Secondary | ICD-10-CM | POA: Diagnosis not present

## 2017-02-11 DIAGNOSIS — N2581 Secondary hyperparathyroidism of renal origin: Secondary | ICD-10-CM | POA: Diagnosis not present

## 2017-02-11 DIAGNOSIS — Z992 Dependence on renal dialysis: Secondary | ICD-10-CM | POA: Diagnosis not present

## 2017-02-11 DIAGNOSIS — N186 End stage renal disease: Secondary | ICD-10-CM | POA: Diagnosis not present

## 2017-02-13 DIAGNOSIS — N186 End stage renal disease: Secondary | ICD-10-CM | POA: Diagnosis not present

## 2017-02-13 DIAGNOSIS — N2581 Secondary hyperparathyroidism of renal origin: Secondary | ICD-10-CM | POA: Diagnosis not present

## 2017-02-16 IMAGING — CR DG CHEST 2V
2 series · 2 of 2 positions shown · non-contrast
Comparison: Chest radiograph May 20, 2015

CLINICAL DATA: Nausea, vomiting, chest pain, shortness of breath.
Dialysis patient.

EXAM:
CHEST  2 VIEW

[chest pa]
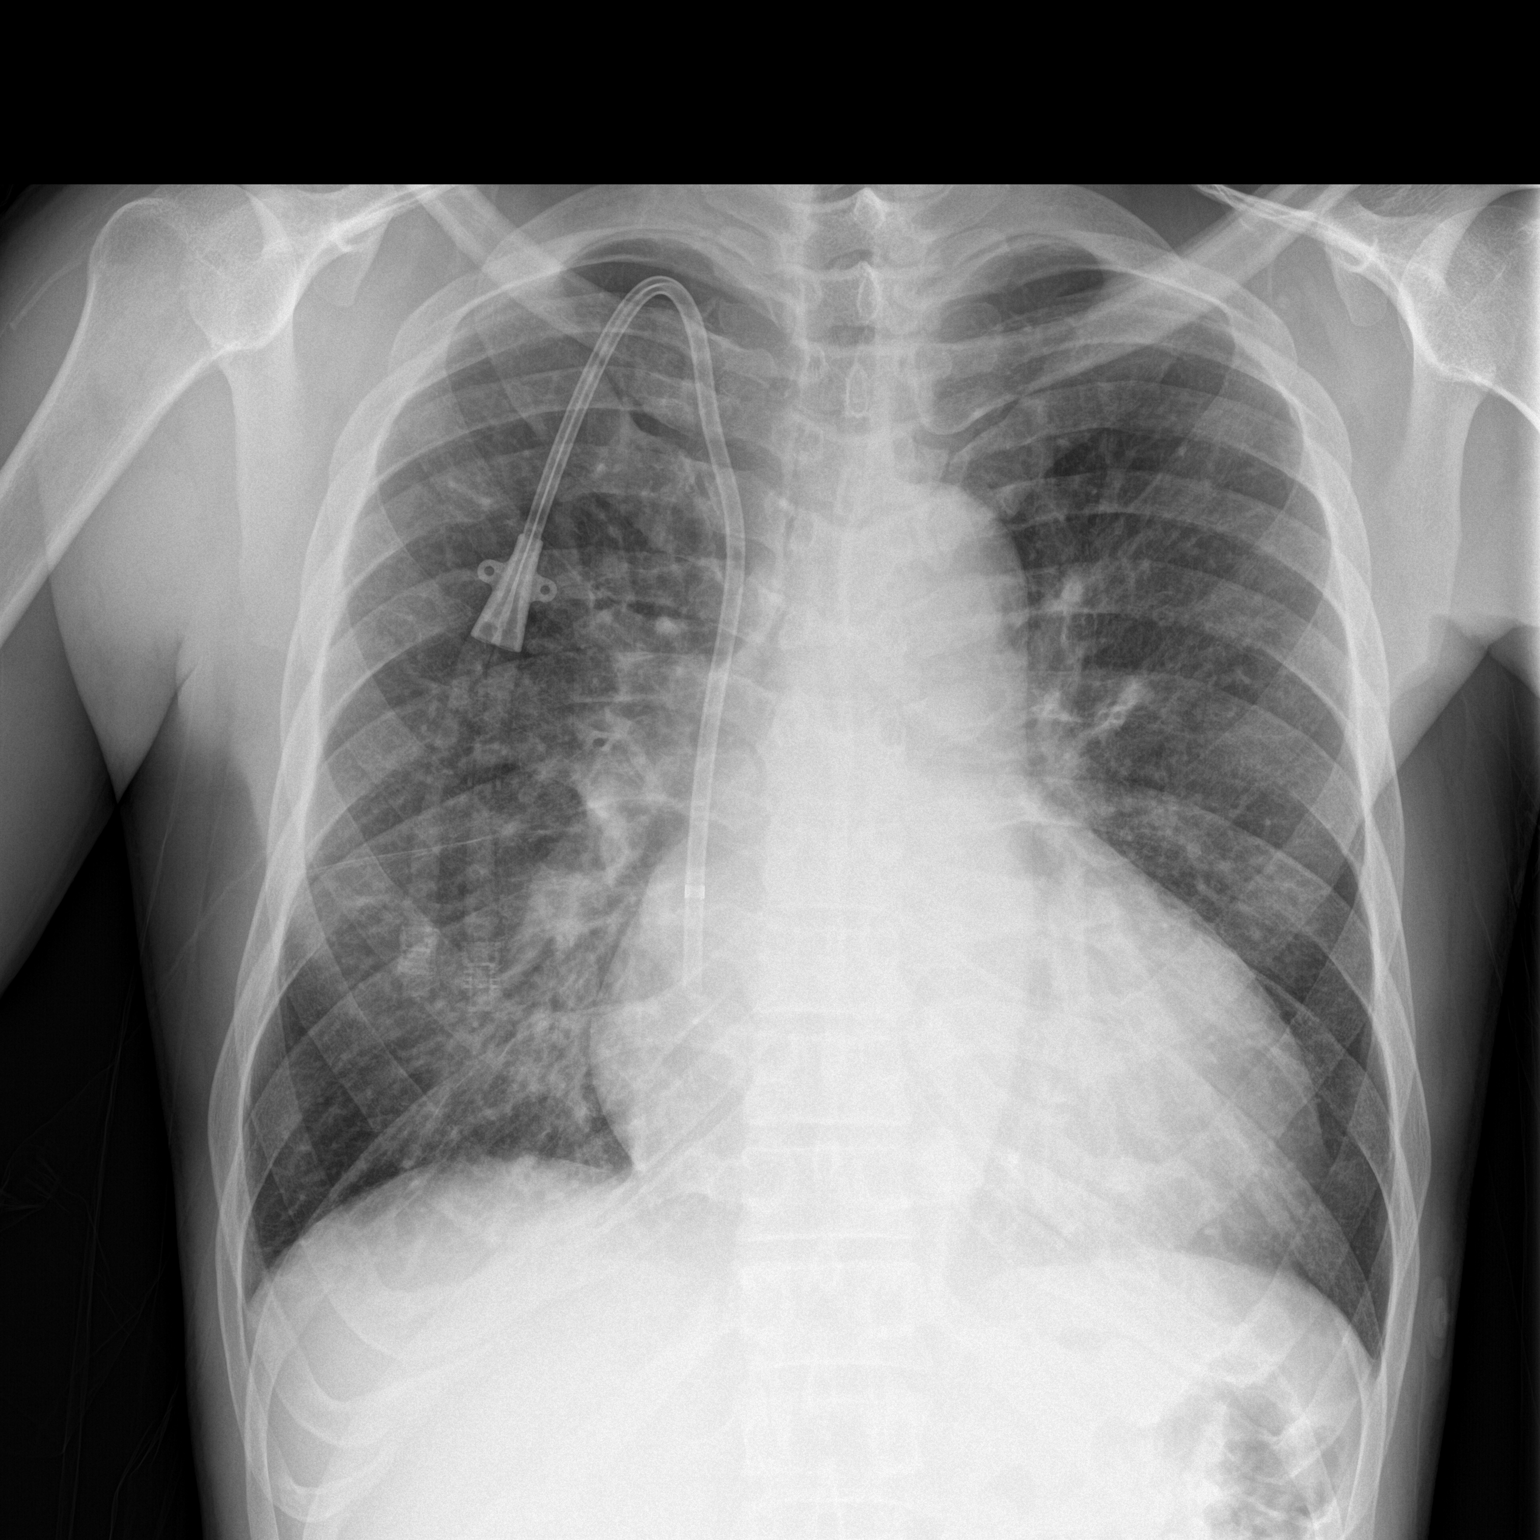

[chest lat]
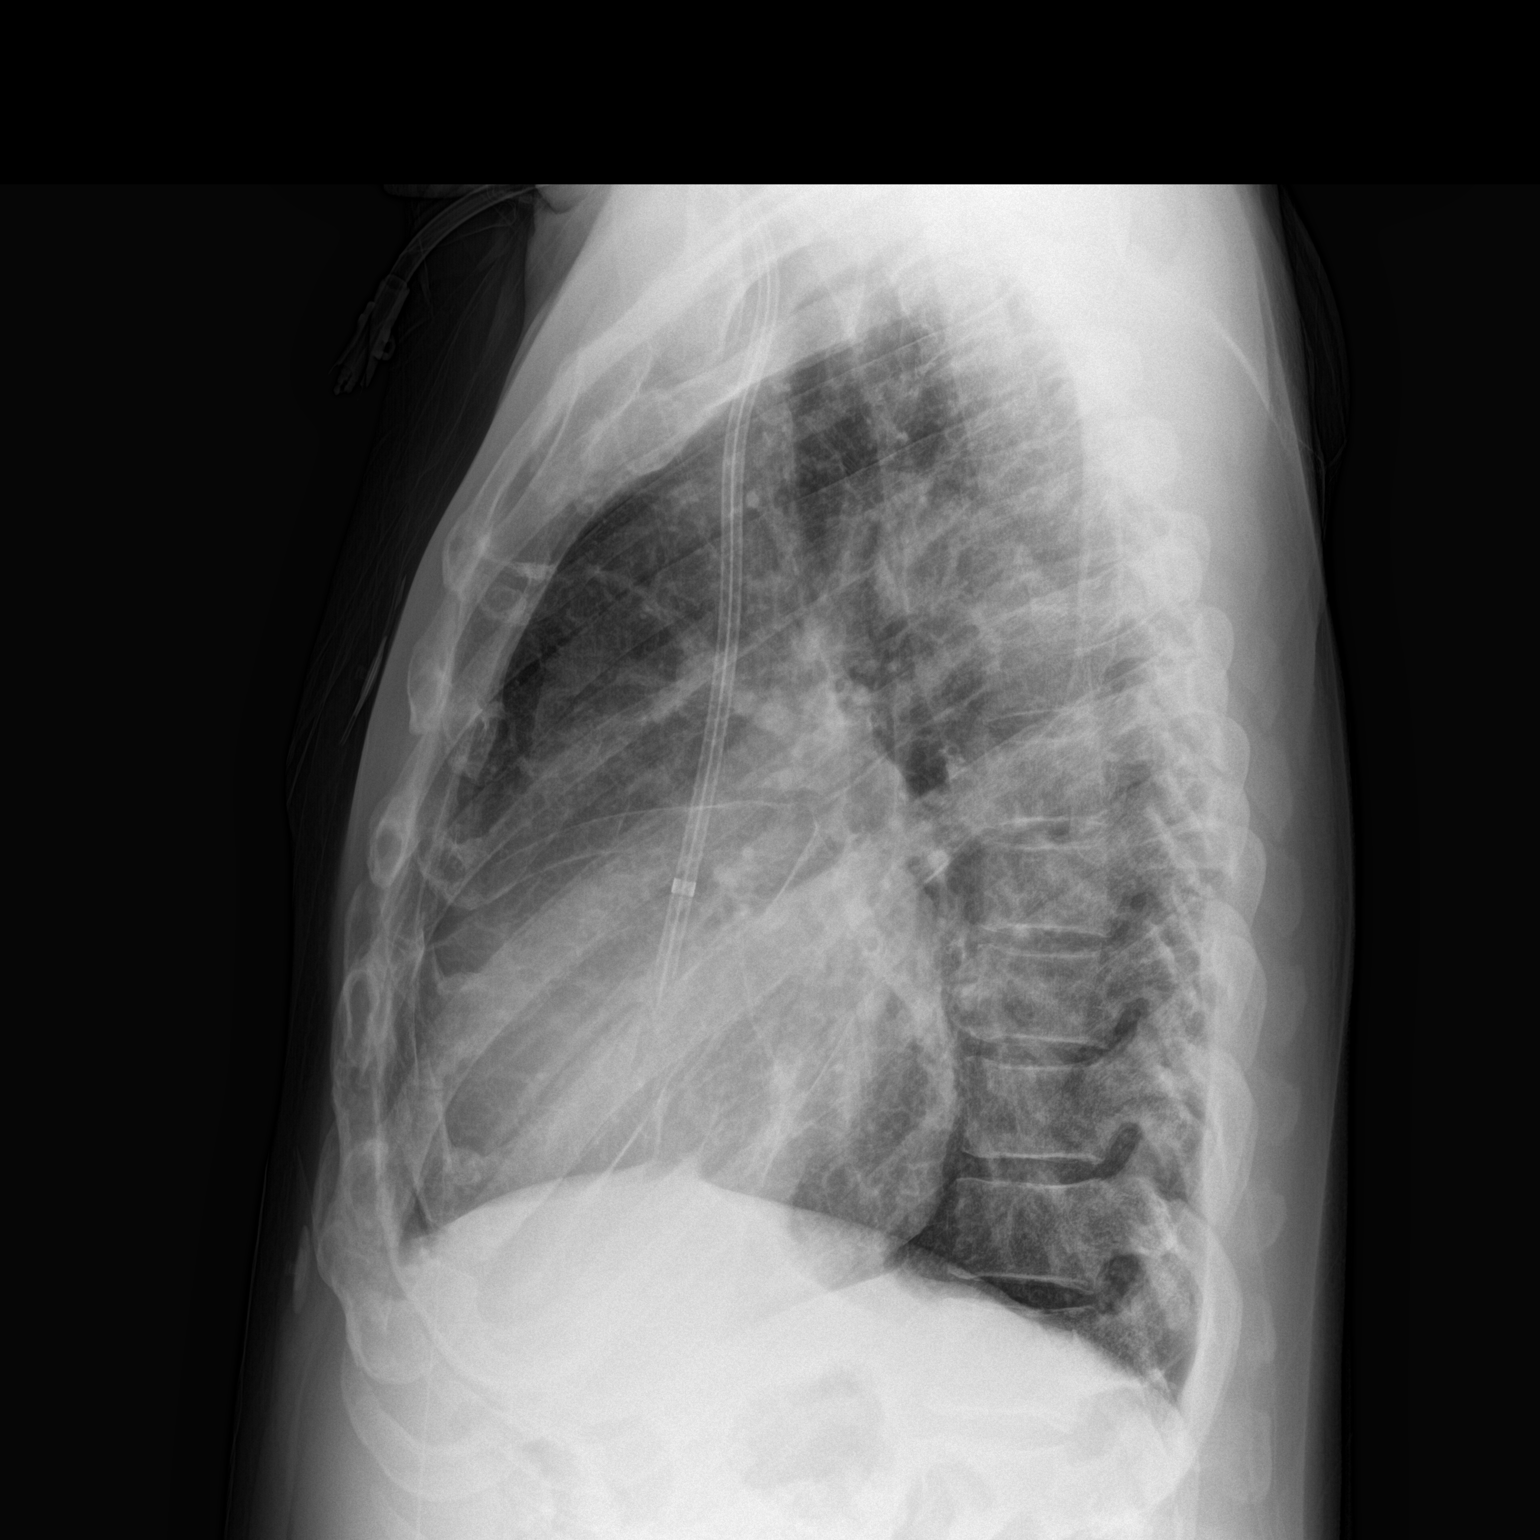

[2 of 2 positions shown; findings below may reference images not displayed]

FINDINGS: The cardiac silhouette is moderately enlarged, similar. Increasing
peribronchial cuff and central pulmonary vascular congestion without
pleural effusion or focal consolidation. Mediastinal silhouette is
unremarkable. No pneumothorax.

Tunneled dialysis catheter via RIGHT internal jugular venous
approach with distal tip projecting in proximal atrium, unchanged.
Soft tissue planes and included osseous structures are
nonsuspicious.
IMPRESSION: Stable cardiomegaly, mildly worsening fluid overload.

  By: Saheda Methila

## 2017-02-18 DIAGNOSIS — N2581 Secondary hyperparathyroidism of renal origin: Secondary | ICD-10-CM | POA: Diagnosis not present

## 2017-02-18 DIAGNOSIS — Z992 Dependence on renal dialysis: Secondary | ICD-10-CM | POA: Diagnosis not present

## 2017-02-18 DIAGNOSIS — N186 End stage renal disease: Secondary | ICD-10-CM | POA: Diagnosis not present

## 2017-02-18 DIAGNOSIS — T8610 Unspecified complication of kidney transplant: Secondary | ICD-10-CM | POA: Diagnosis not present

## 2017-02-18 IMAGING — CR DG CHEST 2V
2 series · 2 of 2 positions shown · non-contrast
Comparison: 09/28/2014

CLINICAL DATA: Vomiting, nausea, cough and sore throat for 1 week,
history hypertension, diabetes, end-stage renal disease, anxiety,
former smoker

EXAM:
CHEST  2 VIEW

[chest pa]
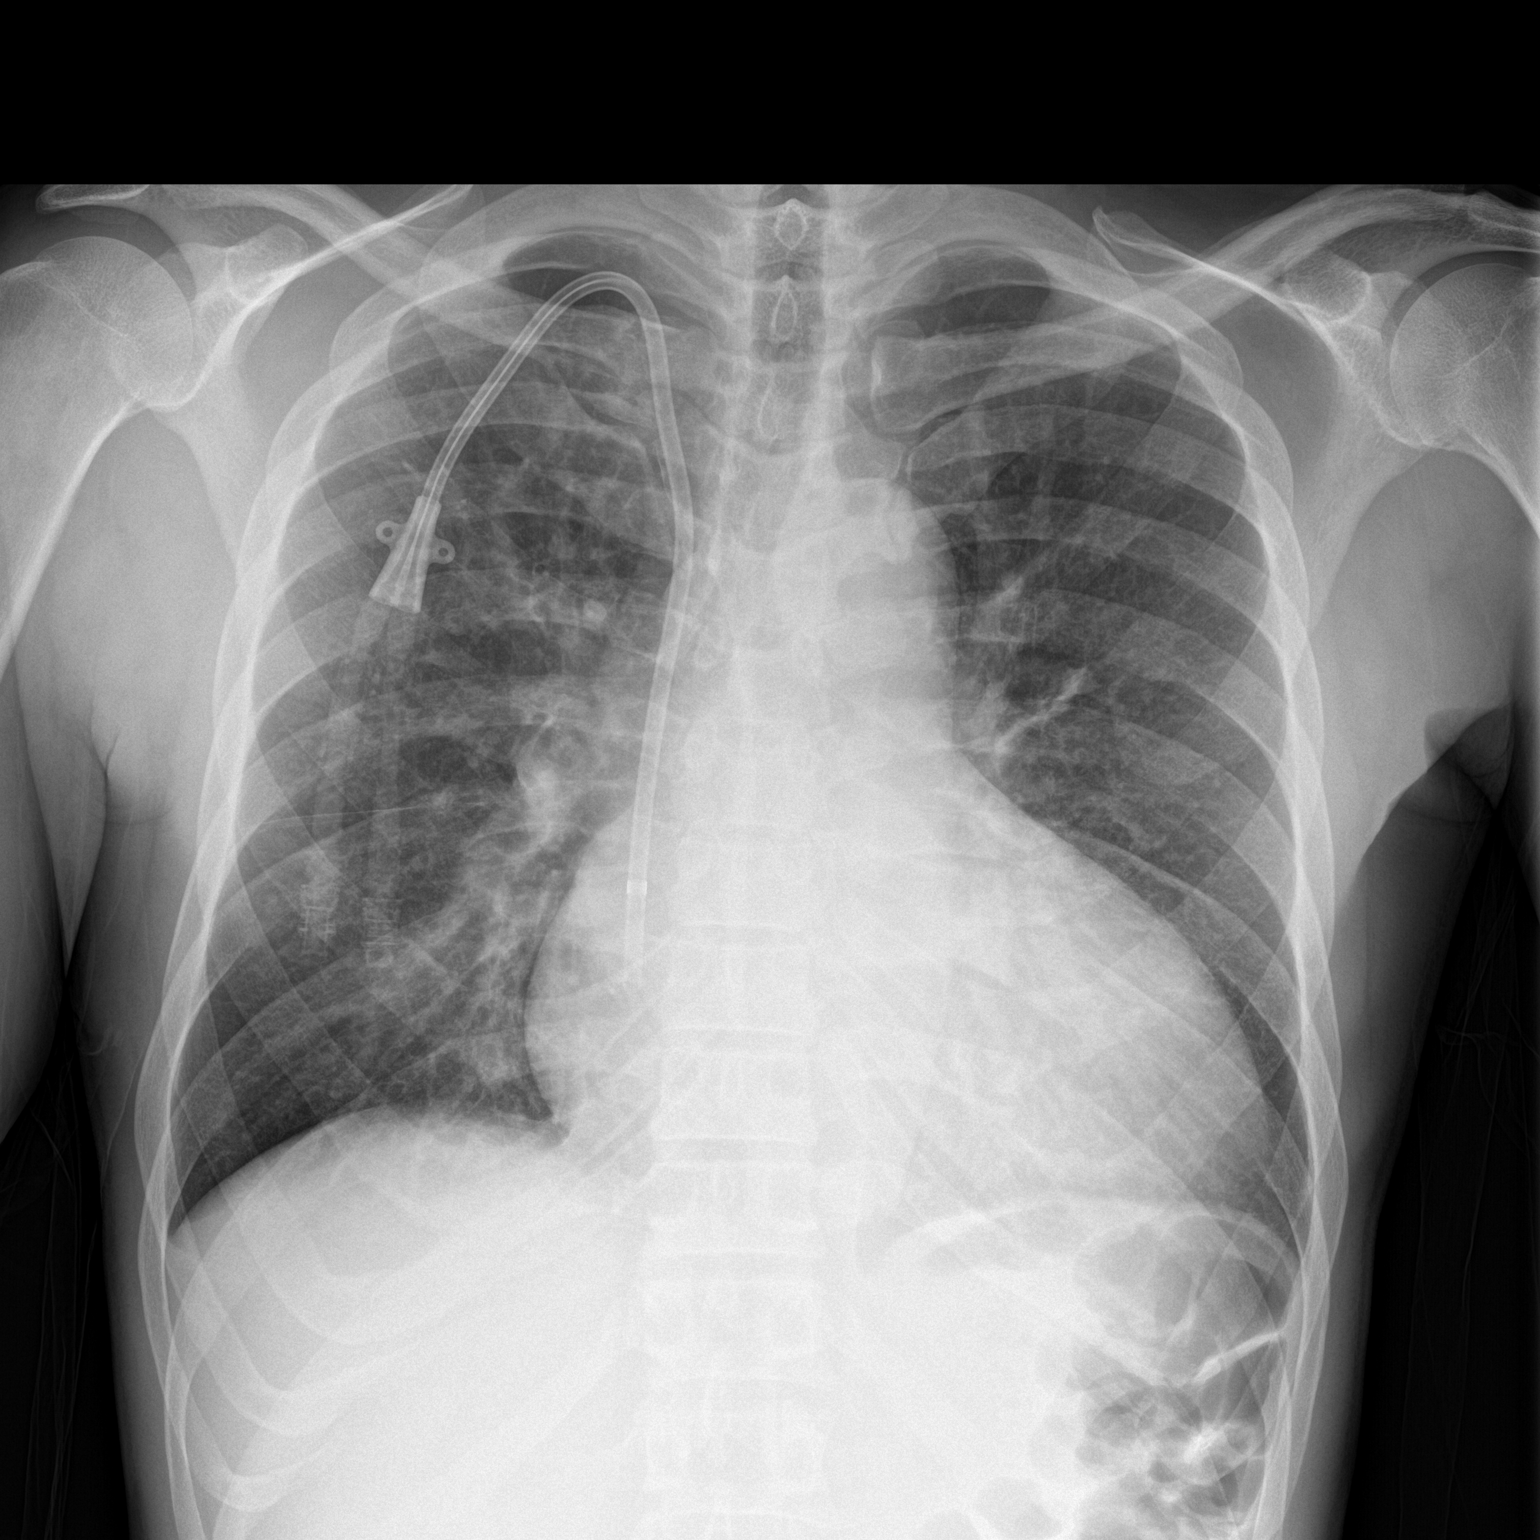

[chest lat]
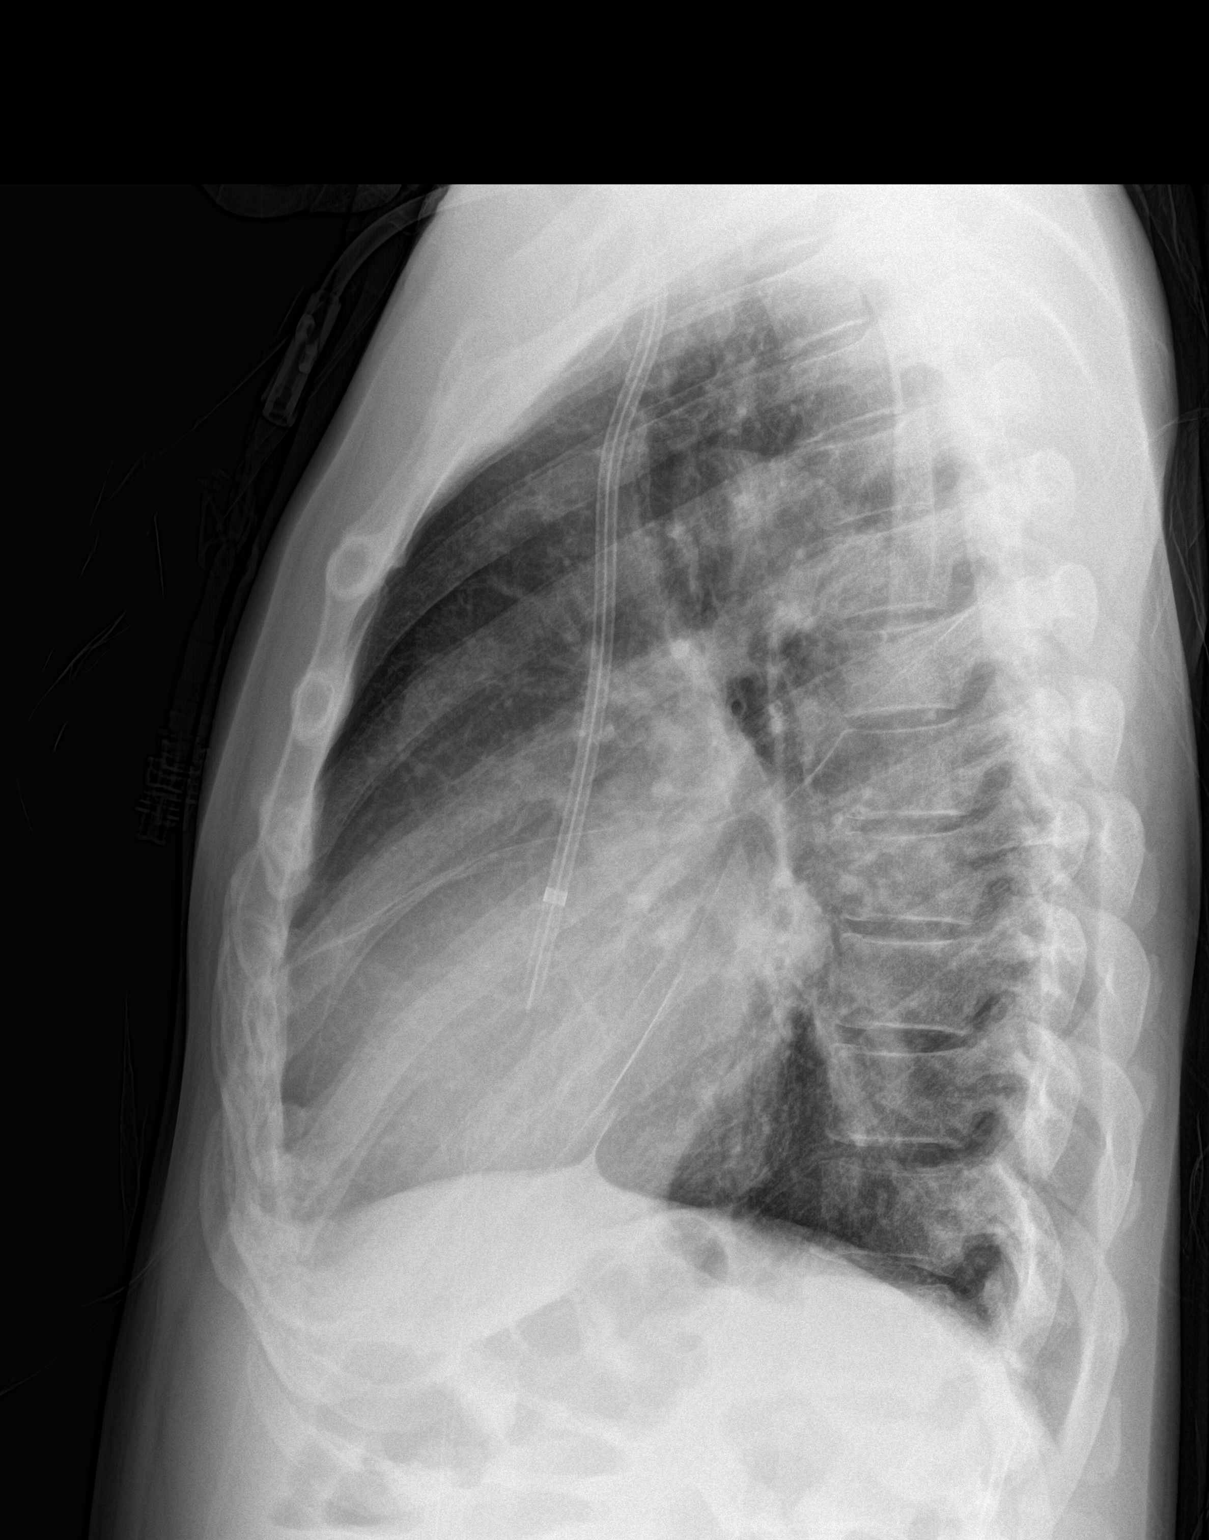

[2 of 2 positions shown; findings below may reference images not displayed]

FINDINGS: RIGHT jugular dual-lumen dialysis catheter with tip projecting over
cavoatrial junction.

Enlargement of cardiac silhouette with pulmonary vascular
congestion.

Mediastinal contours normal.

Minimal peribronchial thickening.

Slightly improved perihilar edema.

No segmental consolidation, pleural effusion, or pneumothorax.
IMPRESSION: Enlargement of cardiac silhouette with pulmonary vascular
congestion.

Minimal perihilar edema, improved

## 2017-02-20 DIAGNOSIS — N2581 Secondary hyperparathyroidism of renal origin: Secondary | ICD-10-CM | POA: Diagnosis not present

## 2017-02-20 DIAGNOSIS — N186 End stage renal disease: Secondary | ICD-10-CM | POA: Diagnosis not present

## 2017-02-22 DIAGNOSIS — R112 Nausea with vomiting, unspecified: Secondary | ICD-10-CM | POA: Diagnosis not present

## 2017-02-22 DIAGNOSIS — R188 Other ascites: Secondary | ICD-10-CM | POA: Diagnosis not present

## 2017-02-22 DIAGNOSIS — N186 End stage renal disease: Secondary | ICD-10-CM | POA: Diagnosis not present

## 2017-02-22 DIAGNOSIS — Z79899 Other long term (current) drug therapy: Secondary | ICD-10-CM | POA: Diagnosis not present

## 2017-02-22 DIAGNOSIS — R1013 Epigastric pain: Secondary | ICD-10-CM | POA: Diagnosis not present

## 2017-02-22 DIAGNOSIS — I120 Hypertensive chronic kidney disease with stage 5 chronic kidney disease or end stage renal disease: Secondary | ICD-10-CM | POA: Diagnosis not present

## 2017-02-22 DIAGNOSIS — Z885 Allergy status to narcotic agent status: Secondary | ICD-10-CM | POA: Diagnosis not present

## 2017-02-22 DIAGNOSIS — N261 Atrophy of kidney (terminal): Secondary | ICD-10-CM | POA: Diagnosis not present

## 2017-02-22 DIAGNOSIS — E1122 Type 2 diabetes mellitus with diabetic chronic kidney disease: Secondary | ICD-10-CM | POA: Diagnosis not present

## 2017-02-22 DIAGNOSIS — N2889 Other specified disorders of kidney and ureter: Secondary | ICD-10-CM | POA: Diagnosis not present

## 2017-02-22 DIAGNOSIS — Z992 Dependence on renal dialysis: Secondary | ICD-10-CM | POA: Diagnosis not present

## 2017-02-22 DIAGNOSIS — Z888 Allergy status to other drugs, medicaments and biological substances status: Secondary | ICD-10-CM | POA: Diagnosis not present

## 2017-02-22 DIAGNOSIS — T8612 Kidney transplant failure: Secondary | ICD-10-CM | POA: Diagnosis not present

## 2017-02-23 ENCOUNTER — Encounter (HOSPITAL_COMMUNITY): Payer: Self-pay | Admitting: Emergency Medicine

## 2017-02-23 ENCOUNTER — Inpatient Hospital Stay (HOSPITAL_COMMUNITY)
Admission: EM | Admit: 2017-02-23 | Discharge: 2017-02-25 | DRG: 811 | Discharge status: Against Medical Advice | Payer: Medicare Other | Attending: Internal Medicine | Admitting: Internal Medicine

## 2017-02-23 DIAGNOSIS — K219 Gastro-esophageal reflux disease without esophagitis: Secondary | ICD-10-CM | POA: Diagnosis present

## 2017-02-23 DIAGNOSIS — N2581 Secondary hyperparathyroidism of renal origin: Secondary | ICD-10-CM | POA: Diagnosis not present

## 2017-02-23 DIAGNOSIS — D62 Acute posthemorrhagic anemia: Secondary | ICD-10-CM | POA: Diagnosis not present

## 2017-02-23 DIAGNOSIS — E1122 Type 2 diabetes mellitus with diabetic chronic kidney disease: Secondary | ICD-10-CM | POA: Diagnosis present

## 2017-02-23 DIAGNOSIS — Z9114 Patient's other noncompliance with medication regimen: Secondary | ICD-10-CM | POA: Diagnosis not present

## 2017-02-23 DIAGNOSIS — R197 Diarrhea, unspecified: Secondary | ICD-10-CM

## 2017-02-23 DIAGNOSIS — I428 Other cardiomyopathies: Secondary | ICD-10-CM | POA: Diagnosis not present

## 2017-02-23 DIAGNOSIS — E1143 Type 2 diabetes mellitus with diabetic autonomic (poly)neuropathy: Secondary | ICD-10-CM | POA: Diagnosis present

## 2017-02-23 DIAGNOSIS — E875 Hyperkalemia: Secondary | ICD-10-CM | POA: Diagnosis not present

## 2017-02-23 DIAGNOSIS — Z886 Allergy status to analgesic agent status: Secondary | ICD-10-CM

## 2017-02-23 DIAGNOSIS — I10 Essential (primary) hypertension: Secondary | ICD-10-CM | POA: Diagnosis present

## 2017-02-23 DIAGNOSIS — D61818 Other pancytopenia: Secondary | ICD-10-CM | POA: Diagnosis present

## 2017-02-23 DIAGNOSIS — Z87891 Personal history of nicotine dependence: Secondary | ICD-10-CM

## 2017-02-23 DIAGNOSIS — I5042 Chronic combined systolic (congestive) and diastolic (congestive) heart failure: Secondary | ICD-10-CM | POA: Diagnosis not present

## 2017-02-23 DIAGNOSIS — J984 Other disorders of lung: Secondary | ICD-10-CM | POA: Diagnosis not present

## 2017-02-23 DIAGNOSIS — I132 Hypertensive heart and chronic kidney disease with heart failure and with stage 5 chronic kidney disease, or end stage renal disease: Secondary | ICD-10-CM | POA: Diagnosis not present

## 2017-02-23 DIAGNOSIS — Z888 Allergy status to other drugs, medicaments and biological substances status: Secondary | ICD-10-CM

## 2017-02-23 DIAGNOSIS — D631 Anemia in chronic kidney disease: Secondary | ICD-10-CM | POA: Diagnosis present

## 2017-02-23 DIAGNOSIS — E1159 Type 2 diabetes mellitus with other circulatory complications: Secondary | ICD-10-CM | POA: Diagnosis present

## 2017-02-23 DIAGNOSIS — Z885 Allergy status to narcotic agent status: Secondary | ICD-10-CM

## 2017-02-23 DIAGNOSIS — N186 End stage renal disease: Secondary | ICD-10-CM | POA: Diagnosis not present

## 2017-02-23 DIAGNOSIS — Z992 Dependence on renal dialysis: Secondary | ICD-10-CM

## 2017-02-23 DIAGNOSIS — D649 Anemia, unspecified: Secondary | ICD-10-CM

## 2017-02-23 DIAGNOSIS — I517 Cardiomegaly: Secondary | ICD-10-CM | POA: Diagnosis not present

## 2017-02-23 DIAGNOSIS — R531 Weakness: Secondary | ICD-10-CM | POA: Diagnosis not present

## 2017-02-23 DIAGNOSIS — Z86718 Personal history of other venous thrombosis and embolism: Secondary | ICD-10-CM

## 2017-02-23 DIAGNOSIS — K3184 Gastroparesis: Secondary | ICD-10-CM | POA: Diagnosis present

## 2017-02-23 DIAGNOSIS — Z9119 Patient's noncompliance with other medical treatment and regimen: Secondary | ICD-10-CM

## 2017-02-23 DIAGNOSIS — R0989 Other specified symptoms and signs involving the circulatory and respiratory systems: Secondary | ICD-10-CM | POA: Diagnosis not present

## 2017-02-23 DIAGNOSIS — R1013 Epigastric pain: Secondary | ICD-10-CM | POA: Diagnosis not present

## 2017-02-23 DIAGNOSIS — M898X9 Other specified disorders of bone, unspecified site: Secondary | ICD-10-CM | POA: Diagnosis present

## 2017-02-23 DIAGNOSIS — R112 Nausea with vomiting, unspecified: Secondary | ICD-10-CM | POA: Diagnosis present

## 2017-02-23 LAB — COMPREHENSIVE METABOLIC PANEL
ALT: 12 U/L — ABNORMAL LOW (ref 17–63)
ANION GAP: 15 (ref 5–15)
AST: 22 U/L (ref 15–41)
Albumin: 3.1 g/dL — ABNORMAL LOW (ref 3.5–5.0)
Alkaline Phosphatase: 114 U/L (ref 38–126)
BILIRUBIN TOTAL: 0.6 mg/dL (ref 0.3–1.2)
BUN: 74 mg/dL — AB (ref 6–20)
CO2: 25 mmol/L (ref 22–32)
Calcium: 8.4 mg/dL — ABNORMAL LOW (ref 8.9–10.3)
Chloride: 97 mmol/L — ABNORMAL LOW (ref 101–111)
Creatinine, Ser: 13.9 mg/dL — ABNORMAL HIGH (ref 0.61–1.24)
GFR, EST AFRICAN AMERICAN: 4 mL/min — AB (ref >60)
GFR, EST NON AFRICAN AMERICAN: 4 mL/min — AB (ref >60)
Glucose, Bld: 119 mg/dL — ABNORMAL HIGH (ref 65–99)
POTASSIUM: 5.7 mmol/L — AB (ref 3.5–5.1)
Sodium: 137 mmol/L (ref 135–145)
TOTAL PROTEIN: 7.2 g/dL (ref 6.5–8.1)

## 2017-02-23 LAB — CBC
HEMATOCRIT: 24.5 % — AB (ref 39.0–52.0)
Hemoglobin: 8 g/dL — ABNORMAL LOW (ref 13.0–17.0)
MCH: 27.2 pg (ref 26.0–34.0)
MCHC: 32.7 g/dL (ref 30.0–36.0)
MCV: 83.3 fL (ref 78.0–100.0)
PLATELETS: 209 10*3/uL (ref 150–400)
RBC: 2.94 MIL/uL — AB (ref 4.22–5.81)
RDW: 18.1 % — AB (ref 11.5–15.5)
WBC: 4.5 10*3/uL (ref 4.0–10.5)

## 2017-02-23 LAB — LIPASE, BLOOD: LIPASE: 32 U/L (ref 11–51)

## 2017-02-23 MED ORDER — ONDANSETRON HCL 4 MG/2ML IJ SOLN
4.0000 mg | Freq: Once | INTRAMUSCULAR | Status: DC
  Administered 2017-02-24: 4 mg via INTRAVENOUS

## 2017-02-23 MED ORDER — HYOSCYAMINE SULFATE 0.5 MG/ML IJ SOLN
0.1250 mg | Freq: Once | INTRAMUSCULAR | Status: AC
  Administered 2017-02-24: 0.125 mg via INTRAVENOUS
  Filled 2017-02-23: qty 0.25

## 2017-02-23 NOTE — ED Notes (Signed)
Pt denies blood in stool and denies dark tarry stools.

## 2017-02-23 NOTE — ED Provider Notes (Signed)
[facility] DEPT Provider Note   CSN: [id number] Arrival date & time: 02/23/17  2018     History   Chief Complaint Chief Complaint  Patient presents with  . Abdominal Pain  . Nausea  . Emesis    HPI Frank [name] is a 51 y.o. male.  Patient with history of ESRD-HD (T, Th, Sat), HTN, nonischemic cardiomyopathy, T2DM presents with periumbilical abdominal pain, nausea and vomiting since last night, c/w history of chronic/recurrent symptoms. The pain is constant without modifying factors. No fever, hematemesis, chest pain, cough or SOB. He also reports that he missed his last session of dialysis yesterday.    The history is provided by the patient. No language interpreter was used.  Abdominal Pain   This is a recurrent problem. The current episode started yesterday. The problem occurs constantly. The pain is located in the periumbilical region. The pain is at a severity of 10/10. Associated symptoms include nausea and vomiting. Pertinent negatives include fever.  Emesis   Associated symptoms include abdominal pain. Pertinent negatives include no chills and no fever.    Past Medical History:  Diagnosis Date  . Anemia   . Anxiety   . Chronic combined systolic and diastolic CHF (congestive heart failure) (HCC)    a. EF 20-25% by echo in 08/2015 b. echo 10/2015: EF 35-40%, diffuse HK, severe LAE, moderate RAE, small pericardial effusion  . Complication of anesthesia    itching, sore throat  . Depression   . Dialysis patient ([name])   . ESRD (end stage renal disease) ([name])    due to HTN per patient, followed at [facility], s/p failed kidney transplant - dialysis Tue, Th, Sat  . Hyperkalemia 12/2015  . Hypertension   . Junctional rhythm    a. noted in 08/2015: hyperkalemic at that time  b. 12/2015: presented in junctional rhythm w/ K+ of 6.6. Resolved with improvement of K+ levels.  . Nonischemic cardiomyopathy ([place])    a. 08/2014: cath showing minimal CAD, but tortuous arteries  noted.   . Personal history of DVT (deep vein thrombosis)/ PE 05/26/2016   In Oct 2015 had small subsemental LUL PE w/o DVT (LE dopplers neg) and was felt to be HD cath related, treated w coumadin.  IN May 2016 had small vein DVT (acute/subacute) in the R basilic/ brachial veins of the RUE, resumed on coumadin.  Had R sided HD cath at that time.    . Renal insufficiency   . Shortness of breath   . Type II diabetes mellitus (HCC)    No history per patient, but remains under history as A1c would not be accurate given on dialysis    Patient Active Problem List   Diagnosis Date Noted  . Volume overload 01/13/2017  . Aortic atherosclerosis ([place]) 01/05/2017  . Abdominal pain 08/04/2016  . Uremia 06/07/2016  . Hyperkalemia 05/29/2016  . GERD (gastroesophageal reflux disease) 05/29/2016  . Personal history of DVT (deep vein thrombosis)/ PE 05/26/2016  . Nonischemic cardiomyopathy ([place]) 01/09/2016  . Bilateral low back pain without sciatica   . Renal cyst, left 10/30/2015  . Constipation by delayed colonic transit 10/30/2015  . Acute on chronic combined systolic and diastolic congestive heart failure ([place]) 09/23/2015  . Chest pain 09/08/2015  . Adjustment disorder with mixed anxiety and depressed mood 08/20/2015  . Essential hypertension 01/02/2015  . Dyslipidemia   . Malignant hypertension 11/29/2014  . ESRD on hemodialysis ([name])   . Acute pulmonary edema (HCC)   . DM (diabetes mellitus),  type 2, uncontrolled, with renal complications ([name])   . Complex sleep apnea syndrome 05/05/2014  . Anemia of chronic kidney failure 06/24/2013    Past Surgical History:  Procedure Laterality Date  . CAPD INSERTION    . CAPD REMOVAL    . INGUINAL HERNIA REPAIR Right 02/14/2015   Procedure: REPAIR INCARCERATED RIGHT INGUINAL HERNIA;  Surgeon: [name], MD;  Location: [facility];  Service: General;  Laterality: Right;  . INSERTION OF DIALYSIS CATHETER Right 09/23/2015   Procedure: exchange of Right  internal Dialysis Catheter.;  Surgeon: [name], MD;  Location: [facility];  Service: Vascular;  Laterality: Right;  . IR GENERIC HISTORICAL  07/16/2016   IR US GUIDE VASC ACCESS LEFT 07/16/2016 Corrie Mckusick, DO [facility]-INTERV RAD  . IR GENERIC HISTORICAL Left 07/16/2016   IR THROMBECTOMY AV FISTULA W/THROMBOLYSIS/PTA INC/SHUNT/IMG LEFT 07/16/2016 Corrie Mckusick, DO [facility]-INTERV RAD  . KIDNEY RECEIPIENT  2006   failed and started HD in March 2014  . LEFT HEART CATHETERIZATION WITH CORONARY ANGIOGRAM N/A 09/02/2014   Procedure: LEFT HEART CATHETERIZATION WITH CORONARY ANGIOGRAM;  Surgeon: [name], MD;  Location[facility];  Service: Cardiovascular;  Laterality: N/A;       Home Medications    Prior to Admission medications   Medication Sig Start Date End Date Taking? Authorizing Provider  amLODipine (NORVASC) 10 MG tablet Take 1 tablet (10 mg total) by mouth at bedtime. 12/01/16   Rice, Resa Miner, MD  cloNIDine (CATAPRES - DOSED IN MG/24 HR) 0.3 mg/24hr patch Place onto the skin once a week. On Wednesday 12/31/16   [provider]  hydrALAZINE (APRESOLINE) 100 MG tablet Take 1 tablet (100 mg total) by mouth 2 (two) times daily. 01/07/17   Ledell Noss, MD  isosorbide mononitrate (IMDUR) 60 MG 24 hr tablet Take 1 tablet (60 mg total) by mouth daily. 01/08/17   Ledell Noss, MD  metoCLOPramide (REGLAN) 5 MG tablet Take 1 tablet (5 mg total) by mouth every 8 (eight) hours as needed for nausea. 12/01/16   Rice, Resa Miner, MD  pantoprazole (PROTONIX) 40 MG tablet Take 1 tablet (40 mg total) by mouth 2 (two) times daily before a meal. 12/01/16   Rice, Resa Miner, MD  sevelamer carbonate (RENVELA) 800 MG tablet Take 1 tablet (800 mg total) by mouth 3 (three) times daily with meals. Patient taking differently: Take 800 mg by mouth 3 (three) times daily with meals. 800 mg as needed with snack 01/06/17   Ledell Noss, MD  traMADol (ULTRAM) 50 MG tablet Take 1 tablet (50 mg total) by mouth every  12 (twelve) hours as needed for moderate pain or severe pain. 12/01/16   Collier Salina, MD    Family History Family History  Problem Relation Age of Onset  . Hypertension Other     Social History Social History  Substance Use Topics  . Smoking status: Former Smoker    Packs/day: 0.00    Years: 1.00    Types: Cigarettes  . Smokeless tobacco: Never Used     Comment: quit Jan 2014  . Alcohol use No     Allergies   Butalbital-apap-caffeine; Ferrlecit [na ferric gluc cplx in sucrose]; Minoxidil; Tylenol [acetaminophen]; and Darvocet [propoxyphene n-acetaminophen]   Review of Systems Review of Systems  Constitutional: Negative for chills, diaphoresis and fever.  HENT: Negative.   Respiratory: Negative.   Cardiovascular: Negative.   Gastrointestinal: Positive for abdominal pain, nausea and vomiting.  Genitourinary: Positive for enuresis (2nd to renal  failure).  Musculoskeletal: Negative.  Negative for back pain.  Skin: Negative.   Neurological: Negative.      Physical Exam Updated Vital Signs BP (!) 162/102 (BP Location: Right Arm)   Pulse 61   Temp 97.9 F (36.6 C) (Oral)   Resp 18   Ht _0  (1.88 m)   Wt 74.8 kg (165 lb)   SpO2 100%   BMI 21.18 kg/m   Physical Exam  Constitutional: He is oriented to person, place, and time. He appears well-developed and well-nourished.  HENT:  Head: Normocephalic.  Neck: Normal range of motion. Neck supple.  Cardiovascular: Normal rate and regular rhythm.   Pulmonary/Chest: Effort normal and breath sounds normal. He has no wheezes. He has no rales.  Abdominal: Bowel sounds are normal. There is tenderness (Diffuse abdominal tenderness, greatest in periumbilical abdomen. Mild distention. ). There is guarding. There is no rebound.  Musculoskeletal: Normal range of motion.  Neurological: He is alert and oriented to person, place, and time.  Skin: Skin is warm and dry. No rash noted.  Psychiatric: He has a normal mood and  affect.     ED Treatments / Results  Labs (all labs ordered are listed, but only abnormal results are displayed) Labs Reviewed  COMPREHENSIVE METABOLIC PANEL - Abnormal; Notable for the following:       Result Value   Potassium 5.7 (*)    Chloride 97 (*)    Glucose, Bld 119 (*)    BUN 74 (*)    Creatinine, Ser 13.90 (*)    Calcium 8.4 (*)    Albumin 3.1 (*)    ALT 12 (*)    GFR calc non Af Amer 4 (*)    GFR calc Af Amer 4 (*)    All other components within normal limits  CBC - Abnormal; Notable for the following:    RBC 2.94 (*)    Hemoglobin 8.0 (*)    HCT 24.5 (*)    RDW 18.1 (*)    All other components within normal limits  LIPASE, BLOOD    EKG  EKG Interpretation None       Radiology No results found.  Procedures Procedures (including critical care time)  Medications Ordered in ED Medications - No data to display   Initial Impression / Assessment and Plan / ED Course  I have reviewed the triage vital signs and the nursing notes.  Pertinent labs & imaging results that were available during my care of the patient were reviewed by me and considered in my medical decision making (see chart for details).     Patient with a history of ESRD-HD, chronic abdominal pain, chronic/recurrent nausea/vomiting presents with c/o abdominal pain, nausea and vomiting. He admits to missing dialysis yesterday (02/22/17). No SOB, CP. Denies hematemesis.   Patient appears uncomfortable. No active vomiting. Abdomen is generally tender with guarding. No fever. Chart reviewed. Multiple CT's in the past with negative result, one episode enterocolitis. VSS, no tachycardia, hypoxia.   Hemoglobin found to be low at 8.0 where last was 12.2 four weeks ago.   Discussed with Dr. Sabra Heck. The patient refuses hemoccult exam. Kayexalate provided for hyperkalemia. Plan to admit for dialysis that will be needed prior to next scheduled clinic date as well as for evaluation of significant drop in  hemoglobin. Hospitalist paged for unassigned admission.  Final Clinical Impressions(s) / ED Diagnoses   Final diagnoses:  None   1. Anemia 2. Hyperkalemia 3. Noncompliance with medical treatment  New Prescriptions  New Prescriptions   No medications on file     Dennie Bible 02/24/17 0005    Noemi Chapel, MD 02/24/17 1322

## 2017-02-23 NOTE — ED Notes (Addendum)
ED Provider at bedside speaking with patient about preforming rectal exam r/t pt low Hgb. Pt refuses.

## 2017-02-23 NOTE — ED Triage Notes (Signed)
Pt c/o 9/10 lower abd pain, nausea and vomiting. HD pt miss last treatment on Saturday, denies any fever or chills.

## 2017-02-23 NOTE — ED Notes (Signed)
Unable to obtain urine sample, pt reports he does not make urine

## 2017-02-24 ENCOUNTER — Encounter (HOSPITAL_COMMUNITY): Payer: Self-pay | Admitting: Internal Medicine

## 2017-02-24 DIAGNOSIS — E1143 Type 2 diabetes mellitus with diabetic autonomic (poly)neuropathy: Secondary | ICD-10-CM | POA: Diagnosis present

## 2017-02-24 DIAGNOSIS — I5042 Chronic combined systolic (congestive) and diastolic (congestive) heart failure: Secondary | ICD-10-CM | POA: Diagnosis present

## 2017-02-24 DIAGNOSIS — R1013 Epigastric pain: Secondary | ICD-10-CM | POA: Diagnosis not present

## 2017-02-24 DIAGNOSIS — D61818 Other pancytopenia: Secondary | ICD-10-CM | POA: Diagnosis present

## 2017-02-24 DIAGNOSIS — I428 Other cardiomyopathies: Secondary | ICD-10-CM | POA: Diagnosis present

## 2017-02-24 DIAGNOSIS — N186 End stage renal disease: Secondary | ICD-10-CM | POA: Diagnosis not present

## 2017-02-24 DIAGNOSIS — D62 Acute posthemorrhagic anemia: Secondary | ICD-10-CM | POA: Diagnosis present

## 2017-02-24 DIAGNOSIS — M898X9 Other specified disorders of bone, unspecified site: Secondary | ICD-10-CM | POA: Diagnosis present

## 2017-02-24 DIAGNOSIS — K3184 Gastroparesis: Secondary | ICD-10-CM | POA: Diagnosis present

## 2017-02-24 DIAGNOSIS — E875 Hyperkalemia: Secondary | ICD-10-CM | POA: Diagnosis not present

## 2017-02-24 DIAGNOSIS — D649 Anemia, unspecified: Secondary | ICD-10-CM

## 2017-02-24 DIAGNOSIS — Z885 Allergy status to narcotic agent status: Secondary | ICD-10-CM | POA: Diagnosis not present

## 2017-02-24 DIAGNOSIS — D631 Anemia in chronic kidney disease: Secondary | ICD-10-CM | POA: Diagnosis not present

## 2017-02-24 DIAGNOSIS — Z992 Dependence on renal dialysis: Secondary | ICD-10-CM

## 2017-02-24 DIAGNOSIS — I10 Essential (primary) hypertension: Secondary | ICD-10-CM

## 2017-02-24 DIAGNOSIS — I120 Hypertensive chronic kidney disease with stage 5 chronic kidney disease or end stage renal disease: Secondary | ICD-10-CM | POA: Diagnosis not present

## 2017-02-24 DIAGNOSIS — I132 Hypertensive heart and chronic kidney disease with heart failure and with stage 5 chronic kidney disease, or end stage renal disease: Secondary | ICD-10-CM | POA: Diagnosis present

## 2017-02-24 DIAGNOSIS — Z87891 Personal history of nicotine dependence: Secondary | ICD-10-CM | POA: Diagnosis not present

## 2017-02-24 DIAGNOSIS — Z886 Allergy status to analgesic agent status: Secondary | ICD-10-CM | POA: Diagnosis not present

## 2017-02-24 DIAGNOSIS — N2581 Secondary hyperparathyroidism of renal origin: Secondary | ICD-10-CM | POA: Diagnosis not present

## 2017-02-24 DIAGNOSIS — E1122 Type 2 diabetes mellitus with diabetic chronic kidney disease: Secondary | ICD-10-CM | POA: Diagnosis present

## 2017-02-24 DIAGNOSIS — R112 Nausea with vomiting, unspecified: Secondary | ICD-10-CM | POA: Diagnosis not present

## 2017-02-24 DIAGNOSIS — Z86718 Personal history of other venous thrombosis and embolism: Secondary | ICD-10-CM | POA: Diagnosis not present

## 2017-02-24 DIAGNOSIS — K219 Gastro-esophageal reflux disease without esophagitis: Secondary | ICD-10-CM | POA: Diagnosis present

## 2017-02-24 DIAGNOSIS — Z888 Allergy status to other drugs, medicaments and biological substances status: Secondary | ICD-10-CM | POA: Diagnosis not present

## 2017-02-24 DIAGNOSIS — Z9119 Patient's noncompliance with other medical treatment and regimen: Secondary | ICD-10-CM | POA: Diagnosis not present

## 2017-02-24 LAB — MRSA PCR SCREENING: MRSA BY PCR: POSITIVE — AB

## 2017-02-24 MED ORDER — ONDANSETRON HCL 4 MG PO TABS: 4.0000 mg | ORAL_TABLET | Freq: Four times a day (QID) | ORAL | Status: DC | PRN

## 2017-02-24 MED ORDER — TRAMADOL HCL 50 MG PO TABS
50.0000 mg | ORAL_TABLET | Freq: Two times a day (BID) | ORAL | Status: DC | PRN
  Administered 2017-02-24 – 2017-02-25 (×2): 50 mg via ORAL
  Filled 2017-02-24 (×3): qty 1

## 2017-02-24 MED ORDER — METOCLOPRAMIDE HCL 5 MG PO TABS
5.0000 mg | ORAL_TABLET | Freq: Three times a day (TID) | ORAL | Status: DC | PRN
  Administered 2017-02-24: 5 mg via ORAL
  Filled 2017-02-24: qty 1

## 2017-02-24 MED ORDER — ONDANSETRON 4 MG PO TBDP
8.0000 mg | ORAL_TABLET | Freq: Once | ORAL | Status: DC
  Filled 2017-02-24: qty 2

## 2017-02-24 MED ORDER — DARBEPOETIN ALFA 200 MCG/0.4ML IJ SOSY
200.0000 ug | PREFILLED_SYRINGE | INTRAMUSCULAR | Status: DC
  Administered 2017-02-24: 200 ug via INTRAVENOUS
  Filled 2017-02-24: qty 0.4

## 2017-02-24 MED ORDER — SODIUM CHLORIDE 0.9 % IV SOLN: 100.0000 mL | INTRAVENOUS | Status: DC | PRN

## 2017-02-24 MED ORDER — SORBITOL 70 % SOLN: 30.0000 mL | Status: DC | PRN

## 2017-02-24 MED ORDER — SEVELAMER CARBONATE 800 MG PO TABS
2400.0000 mg | ORAL_TABLET | Freq: Three times a day (TID) | ORAL | Status: DC
  Administered 2017-02-24: 2400 mg via ORAL
  Filled 2017-02-24: qty 3

## 2017-02-24 MED ORDER — CALCIUM CARBONATE ANTACID 1250 MG/5ML PO SUSP: 500.0000 mg | Freq: Four times a day (QID) | ORAL | Status: DC | PRN

## 2017-02-24 MED ORDER — HYDROXYZINE HCL 25 MG PO TABS: 25.0000 mg | ORAL_TABLET | Freq: Three times a day (TID) | ORAL | Status: DC | PRN

## 2017-02-24 MED ORDER — AMLODIPINE BESYLATE 10 MG PO TABS
10.0000 mg | ORAL_TABLET | Freq: Every day | ORAL | Status: DC
  Administered 2017-02-24: 10 mg via ORAL
  Filled 2017-02-24: qty 1

## 2017-02-24 MED ORDER — LIDOCAINE-PRILOCAINE 2.5-2.5 % EX CREA: 1.0000 "application " | TOPICAL_CREAM | CUTANEOUS | Status: DC | PRN

## 2017-02-24 MED ORDER — DOCUSATE SODIUM 283 MG RE ENEM
1.0000 | ENEMA | RECTAL | Status: DC | PRN
Start: 2017-02-24 — End: 2017-02-25
  Filled 2017-02-24: qty 1

## 2017-02-24 MED ORDER — HYDRALAZINE HCL 50 MG PO TABS
100.0000 mg | ORAL_TABLET | Freq: Two times a day (BID) | ORAL | Status: DC
  Administered 2017-02-24: 100 mg via ORAL
  Filled 2017-02-24: qty 2

## 2017-02-24 MED ORDER — ONDANSETRON HCL 4 MG/2ML IJ SOLN: 4.0000 mg | Freq: Four times a day (QID) | INTRAMUSCULAR | Status: DC | PRN

## 2017-02-24 MED ORDER — ALTEPLASE 2 MG IJ SOLR
2.0000 mg | Freq: Once | INTRAMUSCULAR | Status: DC | PRN
Start: 2017-02-24 — End: 2017-02-24

## 2017-02-24 MED ORDER — HEPARIN SODIUM (PORCINE) 1000 UNIT/ML DIALYSIS: 1000.0000 [IU] | INTRAMUSCULAR | Status: DC | PRN

## 2017-02-24 MED ORDER — CINACALCET HCL 30 MG PO TABS
90.0000 mg | ORAL_TABLET | Freq: Every day | ORAL | Status: DC
  Administered 2017-02-24: 90 mg via ORAL
  Filled 2017-02-24: qty 3

## 2017-02-24 MED ORDER — DARBEPOETIN ALFA 200 MCG/0.4ML IJ SOSY
PREFILLED_SYRINGE | INTRAMUSCULAR | Status: AC
  Filled 2017-02-24: qty 0.4

## 2017-02-24 MED ORDER — CLONIDINE HCL 0.3 MG/24HR TD PTWK: 0.3000 mg | MEDICATED_PATCH | TRANSDERMAL | Status: DC

## 2017-02-24 MED ORDER — ZOLPIDEM TARTRATE 5 MG PO TABS
5.0000 mg | ORAL_TABLET | Freq: Every evening | ORAL | Status: DC | PRN
  Filled 2017-02-24: qty 1

## 2017-02-24 MED ORDER — ISOSORBIDE MONONITRATE ER 60 MG PO TB24
60.0000 mg | ORAL_TABLET | Freq: Every day | ORAL | Status: DC
  Administered 2017-02-24: 60 mg via ORAL
  Filled 2017-02-24: qty 1

## 2017-02-24 MED ORDER — SODIUM CHLORIDE 0.9% FLUSH
3.0000 mL | Freq: Two times a day (BID) | INTRAVENOUS | Status: DC
  Administered 2017-02-24 (×2): 3 mL via INTRAVENOUS

## 2017-02-24 MED ORDER — SEVELAMER CARBONATE 800 MG PO TABS: 800.0000 mg | ORAL_TABLET | Freq: Three times a day (TID) | ORAL | Status: DC | PRN

## 2017-02-24 MED ORDER — PENTAFLUOROPROP-TETRAFLUOROETH EX AERO: 1.0000 "application " | INHALATION_SPRAY | CUTANEOUS | Status: DC | PRN

## 2017-02-24 MED ORDER — PROMETHAZINE HCL 25 MG/ML IJ SOLN
25.0000 mg | Freq: Once | INTRAMUSCULAR | Status: AC
  Administered 2017-02-24: 25 mg via INTRAMUSCULAR
  Filled 2017-02-24 (×2): qty 1

## 2017-02-24 MED ORDER — ENOXAPARIN SODIUM 30 MG/0.3ML ~~LOC~~ SOLN
30.0000 mg | Freq: Every day | SUBCUTANEOUS | Status: DC
  Administered 2017-02-24: 30 mg via SUBCUTANEOUS
  Filled 2017-02-24: qty 0.3

## 2017-02-24 MED ORDER — CAMPHOR-MENTHOL 0.5-0.5 % EX LOTN: 1.0000 "application " | TOPICAL_LOTION | Freq: Three times a day (TID) | CUTANEOUS | Status: DC | PRN

## 2017-02-24 MED ORDER — NEPRO/CARBSTEADY PO LIQD: 237.0000 mL | Freq: Three times a day (TID) | ORAL | Status: DC | PRN

## 2017-02-24 MED ORDER — PRO-STAT SUGAR FREE PO LIQD
30.0000 mL | Freq: Two times a day (BID) | ORAL | Status: DC
  Administered 2017-02-24: 30 mL via ORAL
  Filled 2017-02-24 (×2): qty 30

## 2017-02-24 MED ORDER — SEVELAMER CARBONATE 800 MG PO TABS
800.0000 mg | ORAL_TABLET | Freq: Three times a day (TID) | ORAL | Status: DC
  Administered 2017-02-24: 800 mg via ORAL
  Filled 2017-02-24: qty 1

## 2017-02-24 MED ORDER — HEPARIN SODIUM (PORCINE) 1000 UNIT/ML DIALYSIS: 20.0000 [IU]/kg | INTRAMUSCULAR | Status: DC | PRN

## 2017-02-24 MED ORDER — DOCUSATE SODIUM 100 MG PO CAPS
100.0000 mg | ORAL_CAPSULE | Freq: Two times a day (BID) | ORAL | Status: DC
  Administered 2017-02-24 (×2): 100 mg via ORAL
  Filled 2017-02-24 (×2): qty 1

## 2017-02-24 MED ORDER — LIDOCAINE HCL (PF) 1 % IJ SOLN: 5.0000 mL | INTRAMUSCULAR | Status: DC | PRN

## 2017-02-24 MED ORDER — SODIUM POLYSTYRENE SULFONATE 15 GM/60ML PO SUSP
15.0000 g | Freq: Once | ORAL | Status: DC
  Filled 2017-02-24: qty 60

## 2017-02-24 MED ORDER — PANTOPRAZOLE SODIUM 40 MG PO TBEC
40.0000 mg | DELAYED_RELEASE_TABLET | Freq: Two times a day (BID) | ORAL | Status: DC
  Administered 2017-02-24 (×2): 40 mg via ORAL
  Filled 2017-02-24 (×2): qty 1

## 2017-02-24 MED ORDER — ONDANSETRON HCL 4 MG/2ML IJ SOLN
INTRAMUSCULAR | Status: AC
  Filled 2017-02-24: qty 2

## 2017-02-24 NOTE — ED Notes (Signed)
Unable to obtain IV access, 2 attempt Right AC and Right upper arm

## 2017-02-24 NOTE — Consult Note (Signed)
[name] Renal Consultation Note    Indication for Consultation:  Management of ESRD/hemodialysis, anemia, hypertension/volume, and secondary hyperparathyroidism. PCP:  HPI: Frank [name] is a 51 y.o. male with ESRD, HTN, NICM, Type 2 DM, Hx DVT, Hx L renal cyst/?mass, and GERD/?gastroparesis who was admitted with vomiting and anemia.  [name] says vomiting is recurrent issue, but flared up again for the past few days and associated with epigastric pain. Per notes, apparently had syncopal episode as well. No diarrhea or fever. No CP or dyspnea. Last HD was Thursday 8/2, then was out of town for a few days and missed his usual Friday HD. He reports that he called to request HD on Saturday afternoon, but unable to be squeezed in, so came to the ED instead.  Currently denies CP or dyspnea. Still with epigastric pain and had 1 vomiting episode this morning. Looks like takes omeprazole as outpt, I do not see that he takes reglan.  Past Medical History:  Diagnosis Date  . Anemia   . Anxiety   . Chronic combined systolic and diastolic CHF (congestive heart failure) (HCC)    a. EF 20-25% by echo in 08/2015 b. echo 10/2015: EF 35-40%, diffuse HK, severe LAE, moderate RAE, small pericardial effusion  . Complication of anesthesia    itching, sore throat  . Depression   . Dialysis patient ([place])   . ESRD (end stage renal disease) ([place])    due to HTN per patient, followed at [facility], s/p failed kidney transplant - dialysis Tue, Th, Sat  . Hyperkalemia 12/2015  . Hypertension   . Junctional rhythm    a. noted in 08/2015: hyperkalemic at that time  b. 12/2015: presented in junctional rhythm w/ K+ of 6.6. Resolved with improvement of K+ levels.  . Nonischemic cardiomyopathy ([place])    a. 08/2014: cath showing minimal CAD, but tortuous arteries noted.   . Personal history of DVT (deep vein thrombosis)/ PE 05/26/2016   In Oct 2015 had small subsemental LUL PE w/o DVT (LE dopplers neg) and was  felt to be HD cath related, treated w coumadin.  IN May 2016 had small vein DVT (acute/subacute) in the R basilic/ brachial veins of the RUE, resumed on coumadin.  Had R sided HD cath at that time.    . Renal insufficiency   . Shortness of breath   . Type II diabetes mellitus (HCC)    No history per patient, but remains under history as A1c would not be accurate given on dialysis   Past Surgical History:  Procedure Laterality Date  . CAPD INSERTION    . CAPD REMOVAL    . INGUINAL HERNIA REPAIR Right 02/14/2015   Procedure: REPAIR INCARCERATED RIGHT INGUINAL HERNIA;  Surgeon: [name], MD;  Location: [facility];  Service: General;  Laterality: Right;  . INSERTION OF DIALYSIS CATHETER Right 09/23/2015   Procedure: exchange of Right internal Dialysis Catheter.;  Surgeon: [name], MD;  Location: [facility];  Service: Vascular;  Laterality: Right;  . IR GENERIC HISTORICAL  07/16/2016   IR US GUIDE VASC ACCESS LEFT 07/16/2016 Corrie Mckusick, DO [facility]-INTER[facility]  . IR GENERIC HISTORICAL Left 07/16/2016   IR THROMBECTOMY AV FISTULA W/THROMBOLYSIS/PTA INC/SHUNT/IMG LEFT 07/16/2016 Corrie Mckusick, DO [facility]-INTER[facility]  . KIDNEY RECEIPIENT  2006   failed and started HD in March 2014  . LEFT HEART CATHETERIZATION WITH CORONARY ANGIOGRAM N/A 09/02/2014   Procedure: LEFT HEART CATHETERIZATION WITH CORONARY ANGIOGRAM;  Surgeon: [name], MD;  Location: [facility] CATH LAB;  Service: Cardiovascular;  Laterality: N/A;   Family History  Problem Relation Age of Onset  . Hypertension Other    Social History:  reports that he has quit smoking. His smoking use included Cigarettes. He smoked 0.00 packs per day for 1.00 year. He has never used smokeless tobacco. He reports that he does not drink alcohol or use drugs.  ROS: As per HPI otherwise negative.  Physical Exam: Vitals:   02/23/17 2345 02/24/17 0115 02/24/17 0200 02/24/17 0525  BP: (!) 143/95 (!) 170/110 (!) 170/114 (!) 176/109  Pulse: 61 60 61 63  Resp:    19   Temp:    98 F (36.7 C)  TempSrc:    Oral  SpO2: 98% 98% 97% 97%  Weight:      Height:    6' 2" (1.88 m)     General: Well developed, well nourished, in no acute distress. Head: Normocephalic, atraumatic, sclera non-icteric, mucus membranes are moist. Neck: Supple without lymphadenopathy/masses. JVD not elevated. Lungs: Clear bilaterally to auscultation without wheezes, rales, or rhonchi. Breathing is unlabored. Heart: RRR with normal S1, S2. No murmurs, rubs, or gallops appreciated. Abdomen: Soft, mild tenderness across epigastric area without guarding. Musculoskeletal:  Strength and tone appear normal for age. Lower extremities: No edema or ischemic changes, no open wounds. Neuro: Alert and oriented X 3. Moves all extremities spontaneously. Psych:  Responds to questions appropriately with a normal affect. Dialysis Access: L forearm AVF + thrill  Allergies  Allergen Reactions  . Butalbital-Apap-Caffeine Shortness Of Breath, Swelling and Other (See Comments)    Swelling in throat  . Ferrlecit [Na Ferric Gluc Cplx In Sucrose] Shortness Of Breath, Swelling and Other (See Comments)    Swelling in throat, tolerates Venofor  . Minoxidil Shortness Of Breath  . Tylenol [Acetaminophen] Anaphylaxis and Swelling  . Darvocet [Propoxyphene N-Acetaminophen] Hives   Prior to Admission medications   Medication Sig Start Date End Date Taking? Authorizing Provider  amLODipine (NORVASC) 10 MG tablet Take 1 tablet (10 mg total) by mouth at bedtime. 12/01/16  Yes Rice, Resa Miner, MD  cloNIDine (CATAPRES - DOSED IN MG/24 HR) 0.3 mg/24hr patch Place onto the skin once a week. On Wednesday 12/31/16  Yes [provider]  hydrALAZINE (APRESOLINE) 100 MG tablet Take 1 tablet (100 mg total) by mouth 2 (two) times daily. 01/07/17  Yes Ledell Noss, MD  isosorbide mononitrate (IMDUR) 60 MG 24 hr tablet Take 1 tablet (60 mg total) by mouth daily. 01/08/17  Yes Ledell Noss, MD  metoCLOPramide (REGLAN) 5  MG tablet Take 1 tablet (5 mg total) by mouth every 8 (eight) hours as needed for nausea. 12/01/16  Yes Rice, Resa Miner, MD  pantoprazole (PROTONIX) 40 MG tablet Take 1 tablet (40 mg total) by mouth 2 (two) times daily before a meal. 12/01/16  Yes Rice, Resa Miner, MD  sevelamer carbonate (RENVELA) 800 MG tablet Take 1 tablet (800 mg total) by mouth 3 (three) times daily with meals. Patient taking differently: Take 800 mg by mouth 3 (three) times daily with meals. 800 mg as needed with snack 01/06/17  Yes Ledell Noss, MD  traMADol (ULTRAM) 50 MG tablet Take 1 tablet (50 mg total) by mouth every 12 (twelve) hours as needed for moderate pain or severe pain. 12/01/16  Yes Rice, Resa Miner, MD   Current Facility-Administered Medications  Medication Dose Route Frequency Provider Last Rate Last Dose  . ondansetron (ZOFRAN) 4 MG/2ML injection           .  amLODipine (NORVASC) tablet 10 mg  10 mg Oral QHS Karmen Bongo, MD      . calcium carbonate (dosed in mg elemental calcium) suspension 500 mg of elemental calcium  500 mg of elemental calcium Oral Q6H PRN Karmen Bongo, MD      . camphor-menthol [facility]) lotion 1 application  1 application Topical J5K PRN Karmen Bongo, MD       And  . hydrOXYzine (ATARAX/VISTARIL) tablet 25 mg  25 mg Oral Q8H PRN Karmen Bongo, MD      . Derrill Memo ON 02/26/2017] cloNIDine (CATAPRES - Dosed in mg/24 hr) patch 0.3 mg  0.3 mg Transdermal Q Loralee Pacas, MD      . docusate sodium (COLACE) capsule 100 mg  100 mg Oral BID Karmen Bongo, MD      . docusate sodium [facility]) enema 283 mg  1 enema Rectal PRN Karmen Bongo, MD      . enoxaparin (LOVENOX) injection 30 mg  30 mg Subcutaneous Daily Karmen Bongo, MD      . feeding supplement (NEPRO CARB STEADY) liquid 237 mL  237 mL Oral TID PRN Karmen Bongo, MD      . hydrALAZINE (APRESOLINE) tablet 100 mg  100 mg Oral BID Karmen Bongo, MD      . isosorbide mononitrate (IMDUR) 24 hr tablet 60 mg  60 mg  Oral Daily Karmen Bongo, MD      . metoCLOPramide (REGLAN) tablet 5 mg  5 mg Oral Q8H PRN Karmen Bongo, MD   5 mg at 02/24/17 0938  . ondansetron (ZOFRAN) tablet 4 mg  4 mg Oral Q6H PRN Karmen Bongo, MD       Or  . ondansetron [facility]) injection 4 mg  4 mg Intravenous Q6H PRN Karmen Bongo, MD      . pantoprazole (PROTONIX) EC tablet 40 mg  40 mg Oral BID Meliton Rattan, MD   40 mg at 02/24/17 0829  . sevelamer carbonate (RENVELA) tablet 800 mg  800 mg Oral TID WC Karmen Bongo, MD   800 mg at 02/24/17 0829  . sodium chloride flush (NS) 0.9 % injection 3 mL  3 mL Intravenous Q12H Karmen Bongo, MD      . sodium polystyrene (KAYEXALATE) 15 GM/60ML suspension 15 g  15 g Oral Once Upstill, Shari, PA-C      . sorbitol 70 % solution 30 mL  30 mL Oral PRN Karmen Bongo, MD      . traMADol Veatrice Bourbon) tablet 50 mg  50 mg Oral Q12H PRN Karmen Bongo, MD   50 mg at 02/24/17 1829  . zolpidem (AMBIEN) tablet 5 mg  5 mg Oral QHS PRN Karmen Bongo, MD       Labs: Basic Metabolic Panel:  Recent Labs Lab 02/23/17 2037  NA 137  K 5.7*  CL 97*  CO2 25  GLUCOSE 119*  BUN 74*  CREATININE 13.90*  CALCIUM 8.4*   Liver Function Tests:  Recent Labs Lab 02/23/17 2037  AST 22  ALT 12*  ALKPHOS 114  BILITOT 0.6  PROT 7.2  ALBUMIN 3.1*    Recent Labs Lab 02/23/17 2037  LIPASE 32   CBC:  Recent Labs Lab 02/23/17 2037  WBC 4.5  HGB 8.0*  HCT 24.5*  MCV 83.3  PLT 209   Dialysis Orders:  MWF at [facility], BFR 400, DFR 800, EDW 71.5kg, 2K/2.25Ca, Heparin 2000 bolus, AVF - Mircera 43mg IV q 2 weeks last given 7/12) - Calcitriol 3.578m PO TIW -  Venofer 174m x 10 (ordered, not given yet)  Assessment/Plan: 1.  Vomiting: Improving. Per primary. GERD v. gastroparesis. Prob needs outpt GI evaluation. 2.  Hyperkalemia (mild): For HD today to correct. 3.  ESRD: Continue MWF schedule, for HD today. 4.  Hypertension/volume: BP remains high, will try to get down  to EDW today. Continue home meds. 5.  Anemia: Hgb 8. No signs of GI bleeding. Hgb 8.9 as outpt on last check 7/24. Will resume ESA. 6.  Metabolic bone disease: Ca ok. Continue binders (Renvela) and Sensipar QHS. 7.  Nutrition: Alb 3.1, will add protein supps.  KVeneta Penton PA-C 02/24/2017, 8:59 AM  [facility] Pager: [phone] Pt seen, examined and agree w A/P as above. Admitted last night for N/V.  Improving today. K is slightly high. Plan for HD today.   RKelly SplinterMD CNewell Rubbermaidpager [phone]  02/24/2017, 1:54 PM

## 2017-02-24 NOTE — Procedures (Signed)
Stable on HD today.    I was present at this dialysis session, have reviewed the session itself and made  appropriate changes Kelly Splinter MD [place] pager [phone]   02/24/2017, 1:55 PM

## 2017-02-24 NOTE — H&P (Signed)
History and Physical    Frank [name] DUK:[id number] DOB: 29-Aug-1965 DOA: 02/23/2017  PCP:  None Consultants:  [facility] Patient coming from: Home - lives alone; [place]: No one  Chief Complaint: n/v  HPI: Frank [name] is a 51 y.o. male with medical history significant of HTN, Type II DM, non ischemic cardiomypathy, chronic combined systolic and diastolic heart failure, and ESRD on HD (TTS) presenting with palpitations, syncope, and emesis.  He has recurrent admissions for missing HD, for acute on chronic n/v, etc.  He was frustrated at the time of my encounter and basically refused to answer questions.  He reports "I'm sick and in pain".  Started feeling bad about 2 days ago.  "Same thing as always" - n/v.  Emesis x 5 today.  No blood in emesis.  No blood in stools, not dark.  Normal BM this AM.  Missed HD yesterday because he was "out of town".  Denies eating anything different from usual but otherwise refuses to answer questions about why he was out of town.   ED Course:  No vomiting while in ER.  Hgb 8, baseline appears to be 10-11.  Patient refuses hemoccult.  Kayexalate given for hypoerkalemia.  Patient will need HD tomorrow.  Review of Systems: As per HPI; otherwise review of systems reviewed and negative.   Ambulatory Status:  Ambulates without assistance  Past Medical History:  Diagnosis Date  . Anemia   . Anxiety   . Chronic combined systolic and diastolic CHF (congestive heart failure) (HCC)    a. EF 20-25% by echo in 08/2015 b. echo 10/2015: EF 35-40%, diffuse HK, severe LAE, moderate RAE, small pericardial effusion  . Complication of anesthesia    itching, sore throat  . Depression   . Dialysis patient ([place])   . ESRD (end stage renal disease) ([place])    due to HTN per patient, followed at [facility], s/p failed kidney transplant - dialysis Tue, Th, Sat  . Hyperkalemia 12/2015  . Hypertension   . Junctional rhythm    a. noted in 08/2015: hyperkalemic at that time  b.  12/2015: presented in junctional rhythm w/ K+ of 6.6. Resolved with improvement of K+ levels.  . Nonischemic cardiomyopathy ([place])    a. 08/2014: cath showing minimal CAD, but tortuous arteries noted.   . Personal history of DVT (deep vein thrombosis)/ PE 05/26/2016   In Oct 2015 had small subsemental LUL PE w/o DVT (LE dopplers neg) and was felt to be HD cath related, treated w coumadin.  IN May 2016 had small vein DVT (acute/subacute) in the R basilic/ brachial veins of the RUE, resumed on coumadin.  Had R sided HD cath at that time.    . Renal insufficiency   . Shortness of breath   . Type II diabetes mellitus (HCC)    No history per patient, but remains under history as A1c would not be accurate given on dialysis    Past Surgical History:  Procedure Laterality Date  . CAPD INSERTION    . CAPD REMOVAL    . INGUINAL HERNIA REPAIR Right 02/14/2015   Procedure: REPAIR INCARCERATED RIGHT INGUINAL HERNIA;  Surgeon: [name], MD;  Location: [place];  Service: General;  Laterality: Right;  . INSERTION OF DIALYSIS CATHETER Right 09/23/2015   Procedure: exchange of Right internal Dialysis Catheter.;  Surgeon: [name], MD;  Location: [place];  Service: Vascular;  Laterality: Right;  . IR GENERIC HISTORICAL  07/16/2016   IR  US GUIDE VASC ACCESS LEFT 07/16/2016 Corrie Mckusick, DO [facility]-INTER[facility]  . IR GENERIC HISTORICAL Left 07/16/2016   IR THROMBECTOMY AV FISTULA W/THROMBOLYSIS/PTA INC/SHUNT/IMG LEFT 07/16/2016 Corrie Mckusick, DO [facility]  . KIDNEY RECEIPIENT  2006   failed and started HD in March 2014  . LEFT HEART CATHETERIZATION WITH CORONARY ANGIOGRAM N/A 09/02/2014   Procedure: LEFT HEART CATHETERIZATION WITH CORONARY ANGIOGRAM;  Surgeon: [name], MD;  Location: [facility] CATH LAB;  Service: Cardiovascular;  Laterality: N/A;    Social History   Social History  . Marital status: Married    Spouse name: N/A  . Number of children: 3  . Years of education: [employer]   Occupational History    . works for himself [employer]"    Social History Main Topics  . Smoking status: Former Smoker    Packs/day: 0.00    Years: 1.00    Types: Cigarettes  . Smokeless tobacco: Never Used     Comment: quit Jan 2014  . Alcohol use No  . Drug use: No  . Sexual activity: Not Currently   Other Topics Concern  . Not on file   Social History Narrative   Owns own plumbing company    Allergies  Allergen Reactions  . Butalbital-Apap-Caffeine Shortness Of Breath, Swelling and Other (See Comments)    Swelling in throat  . Ferrlecit [Na Ferric Gluc Cplx In Sucrose] Shortness Of Breath, Swelling and Other (See Comments)    Swelling in throat, tolerates Venofor  . Minoxidil Shortness Of Breath  . Tylenol [Acetaminophen] Anaphylaxis and Swelling  . Darvocet [Propoxyphene N-Acetaminophen] Hives    Family History  Problem Relation Age of Onset  . Hypertension Other     Prior to Admission medications   Medication Sig Start Date End Date Taking? Authorizing Provider  amLODipine (NORVASC) 10 MG tablet Take 1 tablet (10 mg total) by mouth at bedtime. 12/01/16   Rice, Resa Miner, MD  cloNIDine (CATAPRES - DOSED IN MG/24 HR) 0.3 mg/24hr patch Place onto the skin once a week. On Wednesday 12/31/16   [provider]  hydrALAZINE (APRESOLINE) 100 MG tablet Take 1 tablet (100 mg total) by mouth 2 (two) times daily. 01/07/17   Ledell Noss, MD  isosorbide mononitrate (IMDUR) 60 MG 24 hr tablet Take 1 tablet (60 mg total) by mouth daily. 01/08/17   Ledell Noss, MD  metoCLOPramide (REGLAN) 5 MG tablet Take 1 tablet (5 mg total) by mouth every 8 (eight) hours as needed for nausea. 12/01/16   Rice, Resa Miner, MD  pantoprazole (PROTONIX) 40 MG tablet Take 1 tablet (40 mg total) by mouth 2 (two) times daily before a meal. 12/01/16   Rice, Resa Miner, MD  sevelamer carbonate (RENVELA) 800 MG tablet Take 1 tablet (800 mg total) by mouth 3 (three) times daily with meals. Patient taking differently:  Take 800 mg by mouth 3 (three) times daily with meals. 800 mg as needed with snack 01/06/17   Ledell Noss, MD  traMADol (ULTRAM) 50 MG tablet Take 1 tablet (50 mg total) by mouth every 12 (twelve) hours as needed for moderate pain or severe pain. 12/01/16   Collier Salina, MD    Physical Exam: Vitals:   02/23/17 2315 02/23/17 2330 02/23/17 2345 02/24/17 0115  BP: (!) 148/96 (!) 148/111 (!) 143/95 (!) 170/110  Pulse: 64 65 61 60  Resp:      Temp:      TempSrc:      SpO2: 97% 97% 98% 98%  Weight:      Height:         General:  Appears calm and comfortable and is NAD but is quite cantankerous Eyes:  PERRL, EOMI, normal lids, iris ENT:  grossly normal hearing, lips & tongue, mmm Neck:  no LAD, masses or thyromegaly Cardiovascular:  RRR, no m/r/g. No LE edema.  Respiratory:  CTA bilaterally, no w/r/r. Normal respiratory effort. Abdomen:  soft, diffusely tender, nd, NABS Skin:  no rash or induration seen on limited exam Musculoskeletal:  grossly normal tone BUE/BLE, good ROM, no bony abnormality Psychiatric:  depressed mood and affect, speech fluent and appropriate, AOx3, somewhat belligerent Neurologic:  CN 2-12 grossly intact, moves all extremities in coordinated fashion, sensation intact  Labs on Admission: I have personally reviewed following labs and imaging studies  CBC:  Recent Labs Lab 02/23/17 2037  WBC 4.5  HGB 8.0*  HCT 24.5*  MCV 83.3  PLT 938   Basic Metabolic Panel:  Recent Labs Lab 02/23/17 2037  NA 137  K 5.7*  CL 97*  CO2 25  GLUCOSE 119*  BUN 74*  CREATININE 13.90*  CALCIUM 8.4*   GFR: Estimated Creatinine Clearance: 6.6 mL/min (A) (by C-G formula based on SCr of 13.9 mg/dL (H)). Liver Function Tests:  Recent Labs Lab 02/23/17 2037  AST 22  ALT 12*  ALKPHOS 114  BILITOT 0.6  PROT 7.2  ALBUMIN 3.1*    Recent Labs Lab 02/23/17 2037  LIPASE 32   No results for input(s): AMMONIA in the last 168 hours. Coagulation Profile: No  results for input(s): INR, PROTIME in the last 168 hours. Cardiac Enzymes: No results for input(s): CKTOTAL, CKMB, CKMBINDEX, TROPONINI in the last 168 hours. BNP (last 3 results) No results for input(s): PROBNP in the last 8760 hours. HbA1C: No results for input(s): HGBA1C in the last 72 hours. CBG: No results for input(s): GLUCAP in the last 168 hours. Lipid Profile: No results for input(s): CHOL, HDL, LDLCALC, TRIG, CHOLHDL, LDLDIRECT in the last 72 hours. Thyroid Function Tests: No results for input(s): TSH, T4TOTAL, FREET4, T3FREE, THYROIDAB in the last 72 hours. Anemia Panel: No results for input(s): VITAMINB12, FOLATE, FERRITIN, TIBC, IRON, RETICCTPCT in the last 72 hours. Urine analysis:    Component Value Date/Time   COLORURINE YELLOW 10/18/2013 0419   APPEARANCEUR CLEAR 10/18/2013 0419   LABSPEC 1.008 10/18/2013 0419   PHURINE 8.5 (H) 10/18/2013 0419   GLUCOSEU 100 (A) 10/18/2013 0419   HGBUR TRACE (A) 10/18/2013 0419   BILIRUBINUR NEGATIVE 10/18/2013 0419   KETONESUR NEGATIVE 10/18/2013 0419   PROTEINUR 100 (A) 10/18/2013 0419   UROBILINOGEN 0.2 10/18/2013 0419   NITRITE NEGATIVE 10/18/2013 0419   LEUKOCYTESUR NEGATIVE 10/18/2013 0419    Creatinine Clearance: Estimated Creatinine Clearance: 6.6 mL/min (A) (by C-G formula based on SCr of 13.9 mg/dL (H)).  Sepsis Labs: _0 (procalcitonin:4,lacticidven:4) )No results found for this or any previous visit (from the past 240 hour(s)).   Radiological Exams on Admission: No results found.  EKG: not done  Assessment/Plan Principal Problem:   Anemia Active Problems:   Nausea and vomiting   ESRD on hemodialysis (HCC)   Essential hypertension   Hyperkalemia   Anemia -Hgb 8, prior 10.7 on 7/7 -MCV is 80-100, indicative of normocytic anemia -This generally occurs in chronic disease and acute blood loss anemia -While this may simply be anemia associated with chronic renal disease, his baseline appears to be  10-11 and so this drop is potentially concerning -Patient denies acute blood loss  including in emesis or stools -Patient declines hemoccult -Will follow Hgb without intervention for now  N/V -This appears to be chronic and recurrent -He has a diagnosis of GERD although gastroparesis seems at least as likely -In order to avoid worsening of the gastroparesis, avoidance of narcotics should be undertaken whenever possible -Continue home Reglan -Add prn Zofran and a 1-time dose of Phenergan IM if needed  ESRD with hyperkalemia -Patient appears to be non-compliant with HD -K+ 5.7, given kayexalate -Will order EKG and monitor on telemetry but currently low concern for this being an acute issue -Needs maintenance HD tomorrow, voice mail message left -Glucose 119; A1c 4.6 in 3/18 -BUN 74/Creatinine 13.9/GFR 4  HTN -Appears to take Norvasc, Catapres TTS, Hydralazine as an outpatient -Suboptimal control in the ER -This may be due to non-compliance or may indicate that he needs additional medication -Will add prn IV Hydralazine for now and follow   DVT prophylaxis: Lovenox Code Status:  Full - confirmed with patient Family Communication: None present Disposition Plan:  Home once clinically improved Consults called: Nephrology  Admission status: It is my clinical opinion that referral for OBSERVATION is reasonable and necessary in this patient based on the above information provided. The aforementioned taken together are felt to place the patient at high risk for further clinical deterioration. However it is anticipated that the patient may be medically stable for discharge from the hospital within 24 to 48 hours.    Karmen Bongo MD [facility]  If 7PM-7AM, please contact night-coverage [email or web address] Password [email or web address]  02/24/2017, 1:37 AM

## 2017-02-24 NOTE — Progress Notes (Signed)
Patient admitted after midnight, please see H&P.  Plan for dialysis today- Not compliant with dialysis.  Discussed pain medications and how he would not be given pain meds upon d/c but patient said "I don't want any narcotics"  Frank Bear DO

## 2017-02-24 NOTE — ED Notes (Signed)
Order for ODT zofran placed by MD for failure to get IV line. Pt refusing ODT zofran. Charge RN notified and attempting IV Access.

## 2017-02-24 NOTE — Care Management Obs Status (Signed)
[facility] NOTIFICATION   Patient Details  Name: Frank [name] MRN: [id number] Date of Birth: 1965-12-13   Medicare Observation Status Notification Given:  Yes    Herberta Pickron, Rory Percy, RN 02/24/2017, 4:33 PM

## 2017-02-25 IMAGING — CT CT ANGIO CHEST
2 of 8 series · 19 of 46 positions shown · IV contrast (Omni 300)
Comparison: 08/30/2014

CLINICAL DATA: Dyspnea for 4 days with pain radiating into left
upper extremity.

EXAM:
CT ANGIOGRAPHY CHEST WITH CONTRAST
TECHNIQUE: Multidetector CT imaging of the chest was performed using the
standard protocol during bolus administration of intravenous
contrast. Multiplanar CT image reconstructions and MIPs were
obtained to evaluate the vascular anatomy.
CONTRAST:  80mL OMNIPAQUE IOHEXOL 350 MG/ML SOLN

[Series 6: thins · axial · 0.65mm/px · z∈[+78,+401]mm · 16 of 357 slices shown]
[im 17/357  lung]
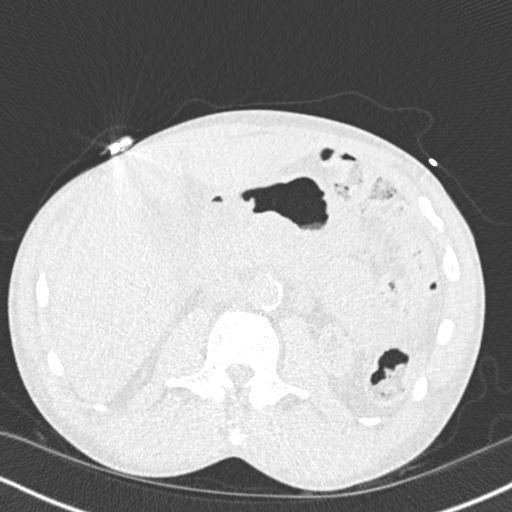
[im 33/357  soft-tissue]
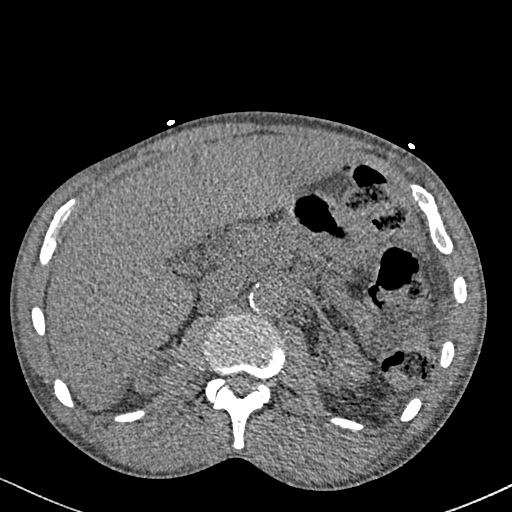
[im 65/357  lung]
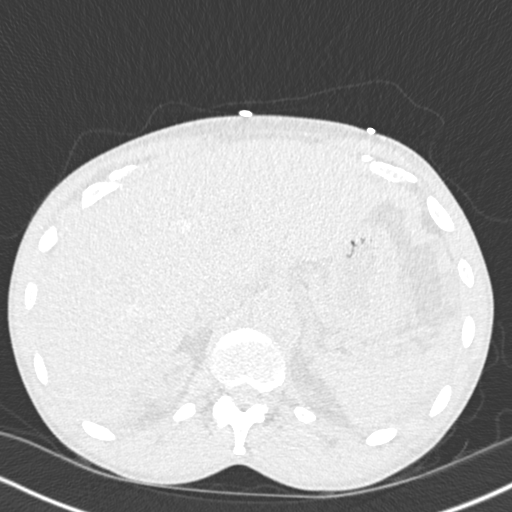
[im 81/357  soft-tissue]
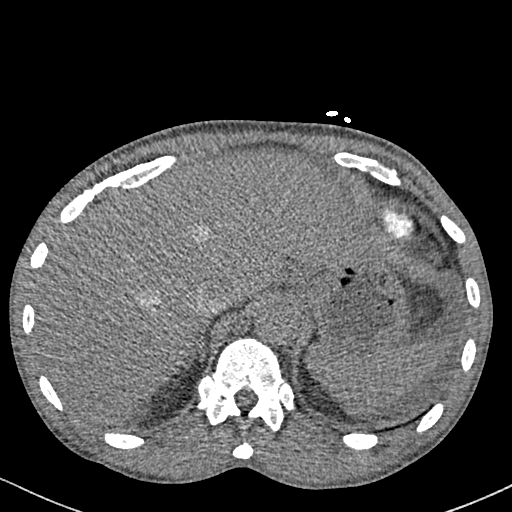
[im 98/357  lung]
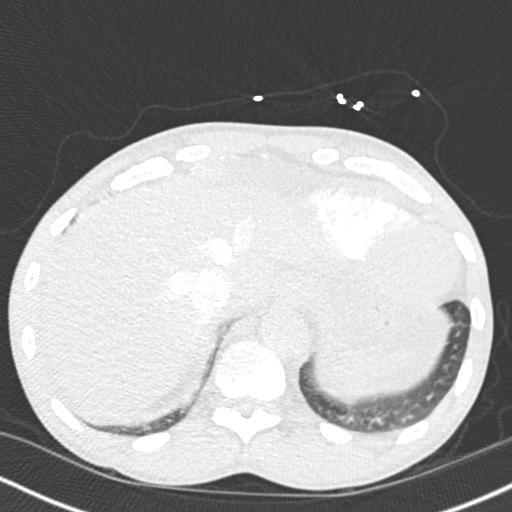
[im 130/357  soft-tissue]
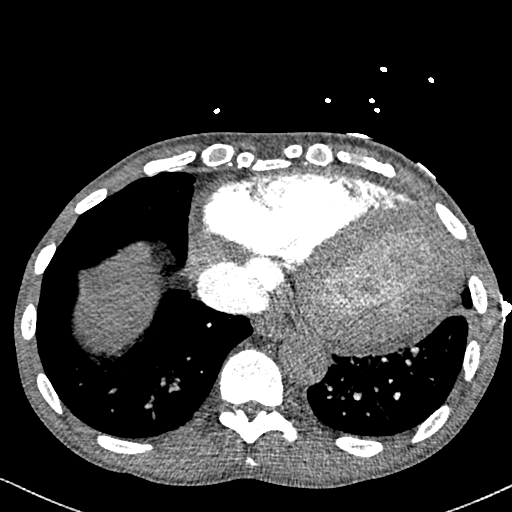
[im 146/357  lung]
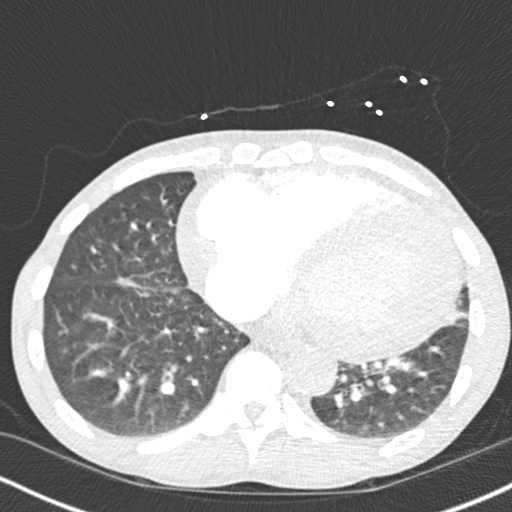
[im 162/357  soft-tissue]
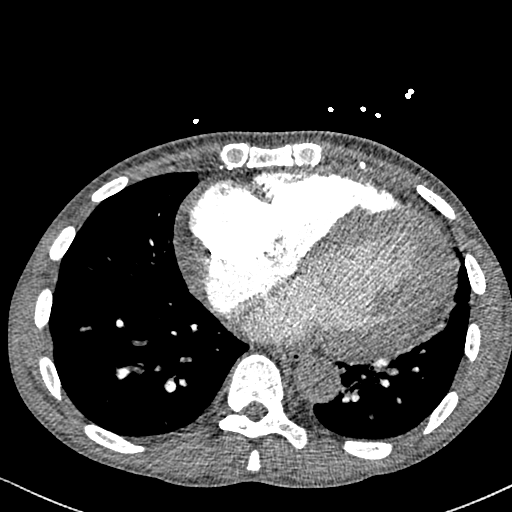
[im 195/357  lung]
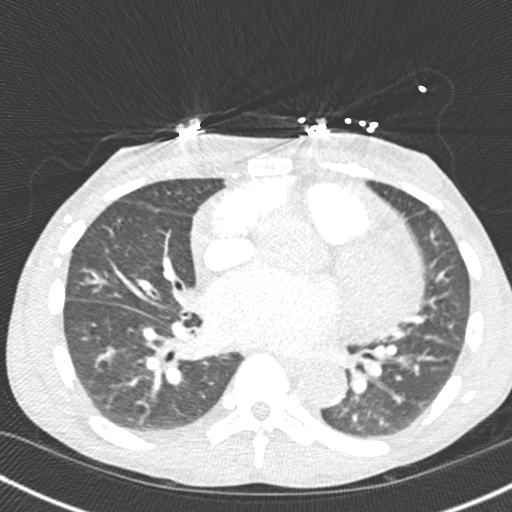
[im 211/357  soft-tissue]
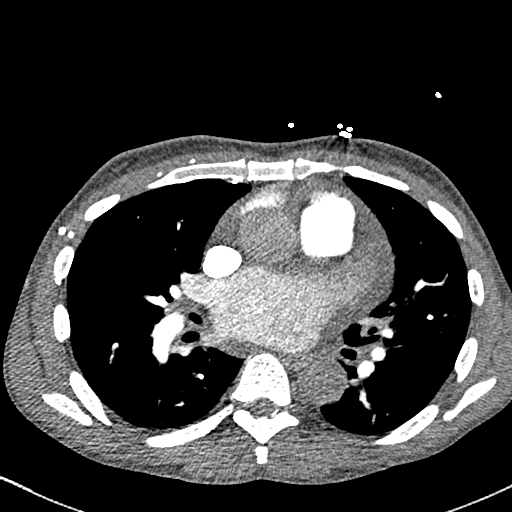
[im 227/357  lung]
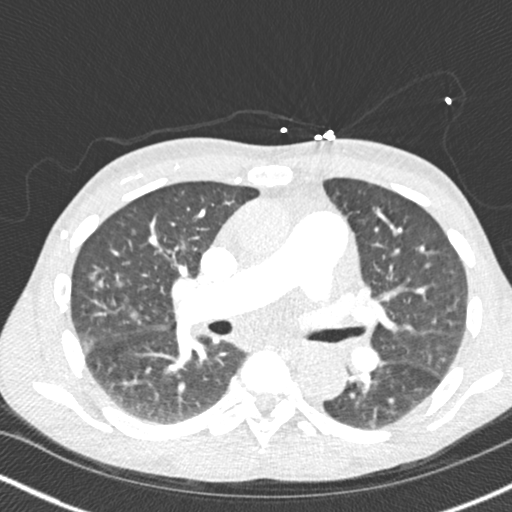
[im 259/357  soft-tissue]
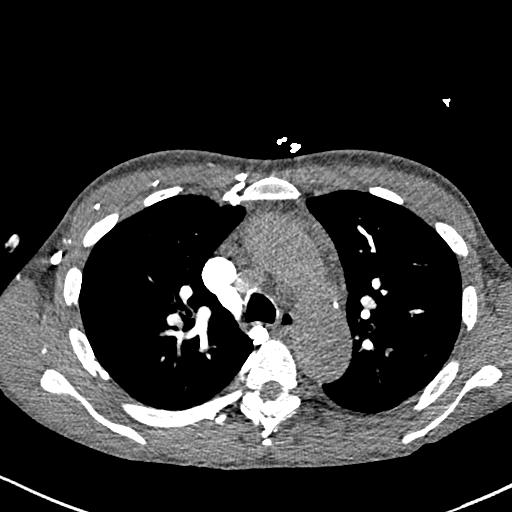
[im 276/357  lung]
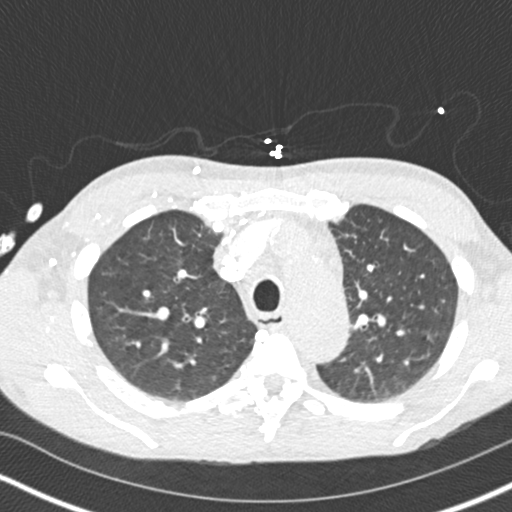
[im 292/357  soft-tissue]
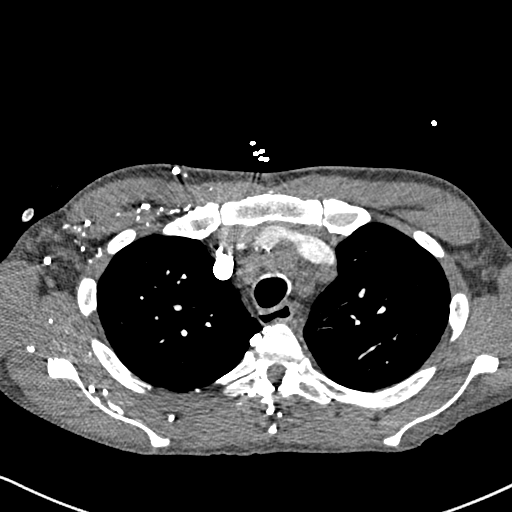
[im 324/357  lung]
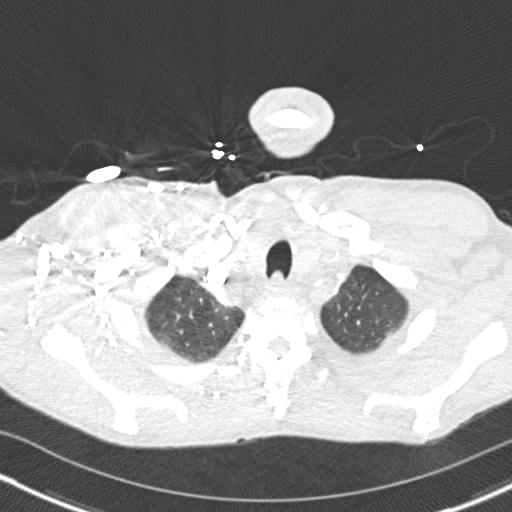
[im 340/357  soft-tissue]
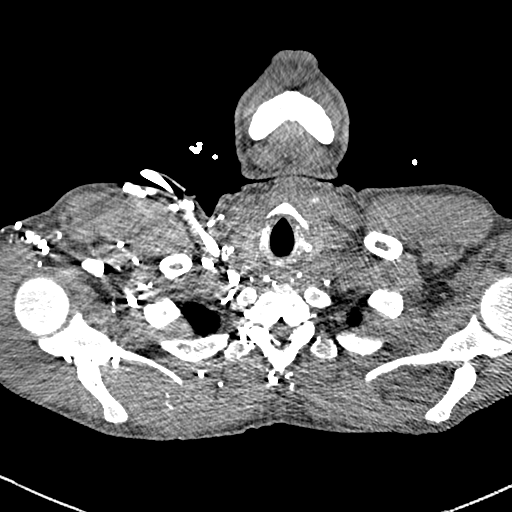

[Series 8: coronal mpr · coronal · 0.67mm/px · 3 of 123 slices shown]
[im 31/123  soft-tissue]
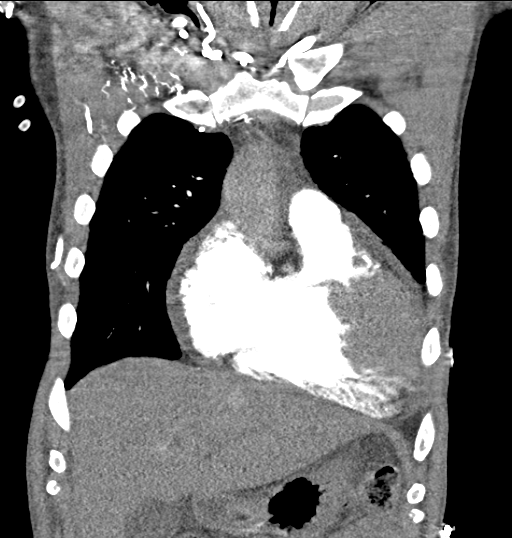
[im 62/123  soft-tissue]
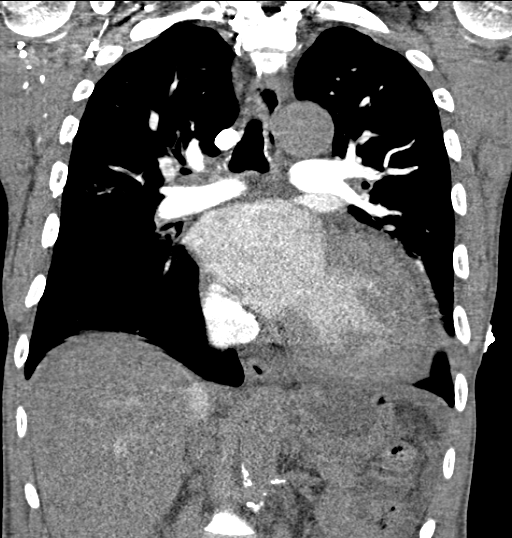
[im 92/123  soft-tissue]
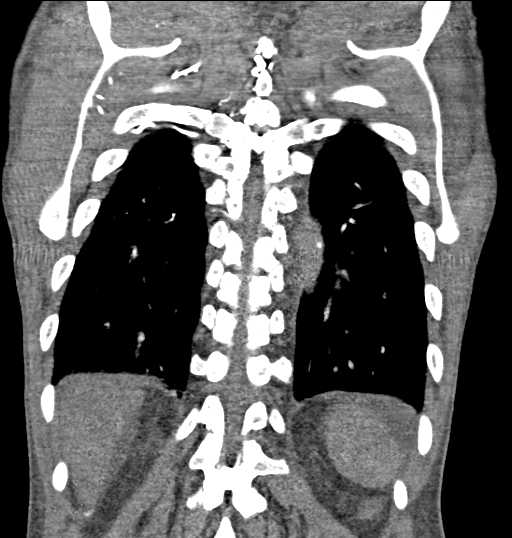

[19 of 46 positions shown; findings below may reference images not displayed]

FINDINGS: The pulmonary vasculature is well opacified with no evidence of
pulmonary embolism. There is mild aneurysmal enlargement of the
proximal descending aorta, measuring 3.7 cm. The ascending aorta is
normal in caliber. The mid the distal portions of the descending
aorta are normal in caliber. This is unchanged from 08/30/2014.

There are no effusions. There is no mediastinal or hilar adenopathy.
Esophagus appears unremarkable.

There is airspace opacity in the right upper lobe which may
represent infiltrate or aspiration. The left lung is clear.

Upper abdomen is remarkable for atrophic kidneys and a large liver.

Review of the MIP images confirms the above findings.
IMPRESSION: 1. Negative for pulmonary embolism
2. Mild aneurysmal enlargement of the proximal descending thoracic
aorta. Recommend annual imaging followup by CTA or MRA. This
recommendation follows 4282
ACCF/AHA/AATS/ACR/ASA/SCA/NISHANT/ROBERLANIO/AIRIESHILA/MACEDONIO Guidelines for the
Diagnosis and Management of Patients with Thoracic Aortic Disease.
Circulation.4282; 121: [id number]
3. Right upper lobe infiltrate, possibly infectious.

## 2017-02-25 IMAGING — DX DG CERVICAL SPINE COMPLETE 4+V
6 series · 6 of 6 positions shown · non-contrast
Comparison: None.

CLINICAL DATA: Left upper extremity pain/radicular type symptoms

EXAM:
CERVICAL SPINE  4+ VIEWS

[c-spine lat]
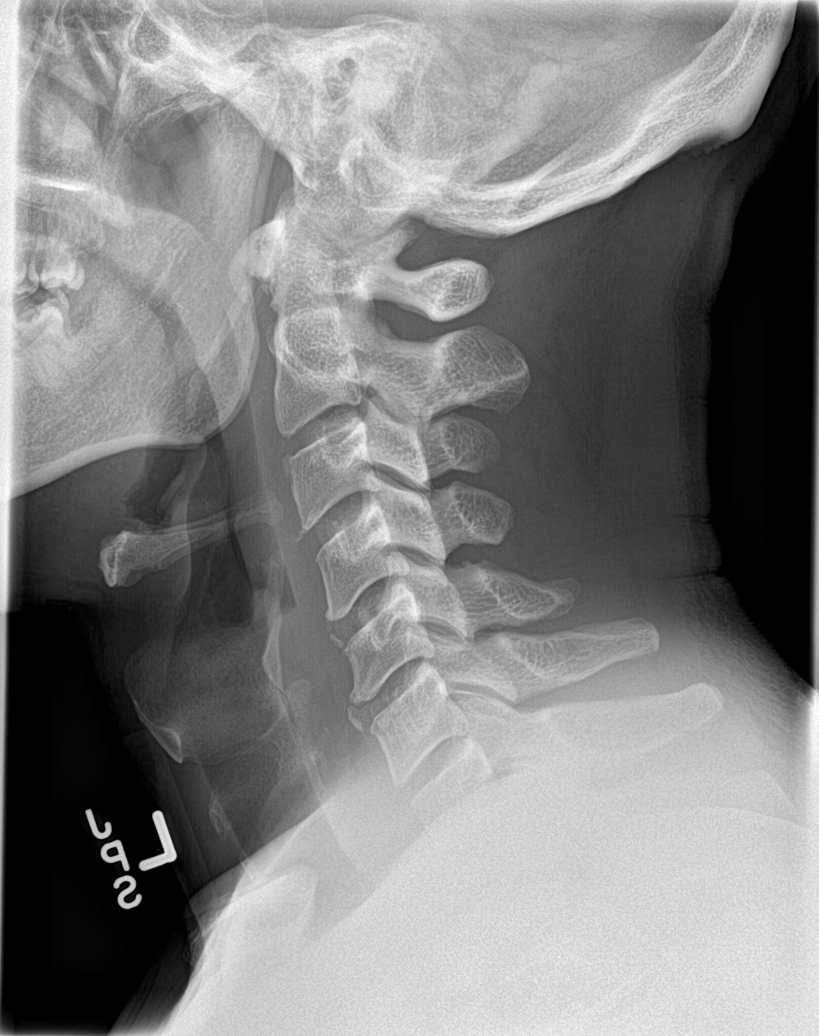

[c-spine obl (1 of 2)]
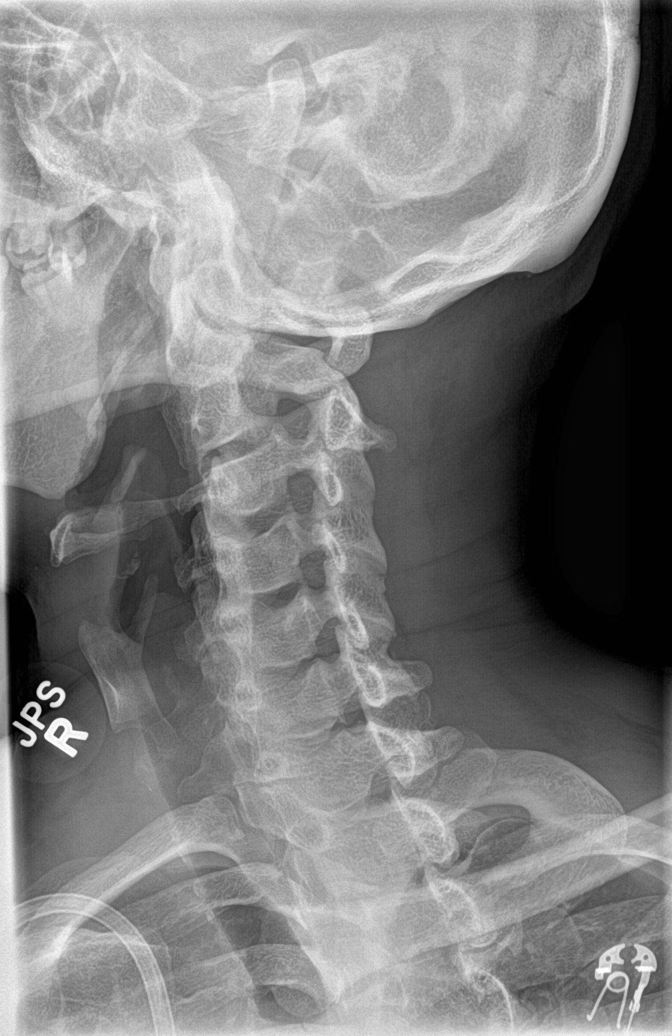

[c-spine ap]
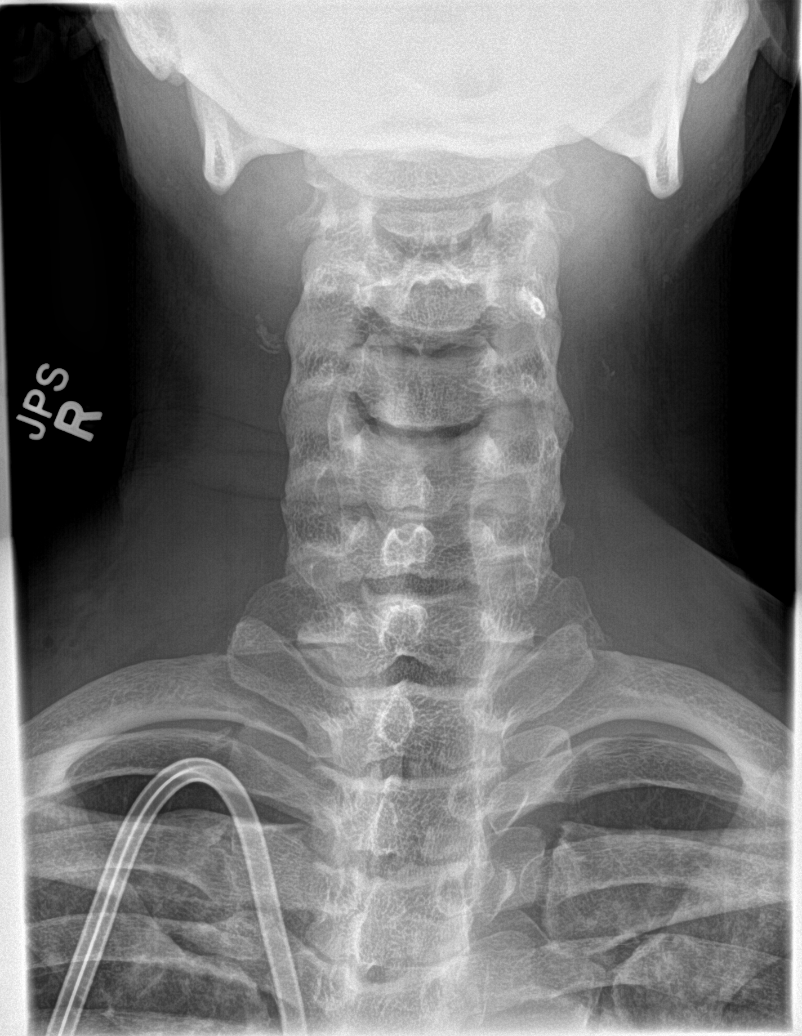

[c-spine open mouth]
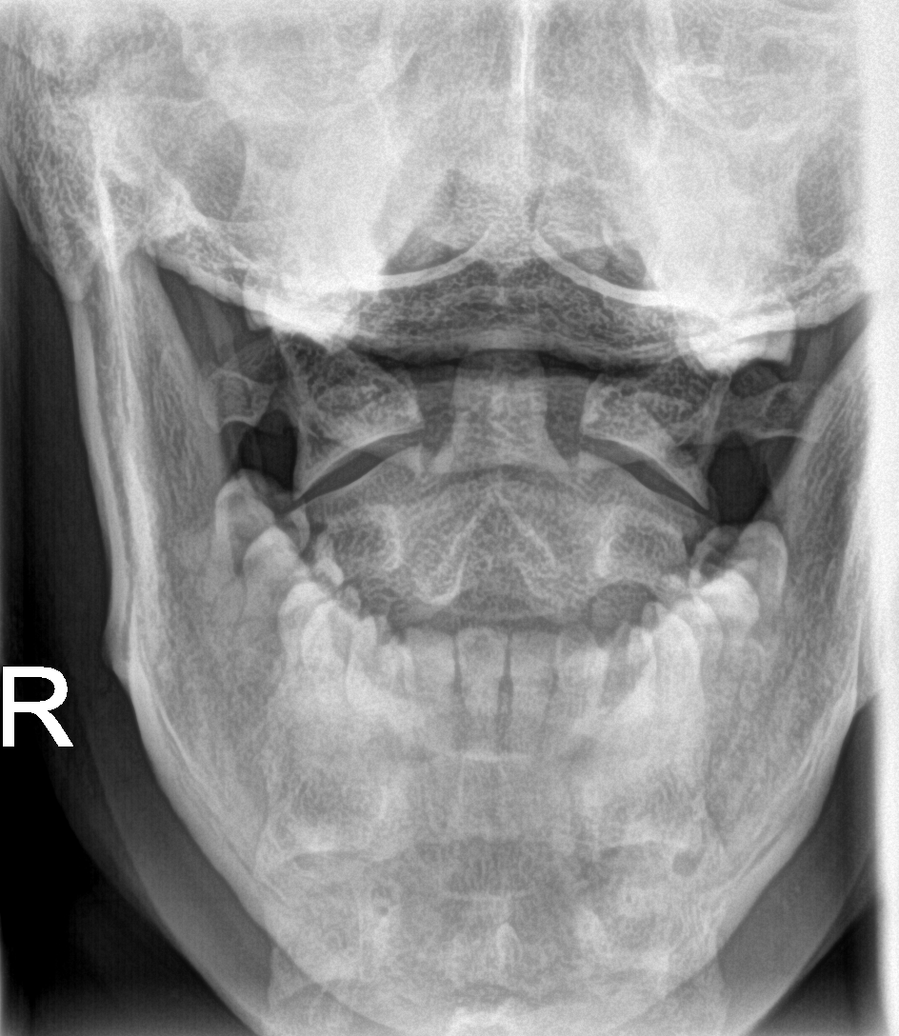

[c-spine swimmers]
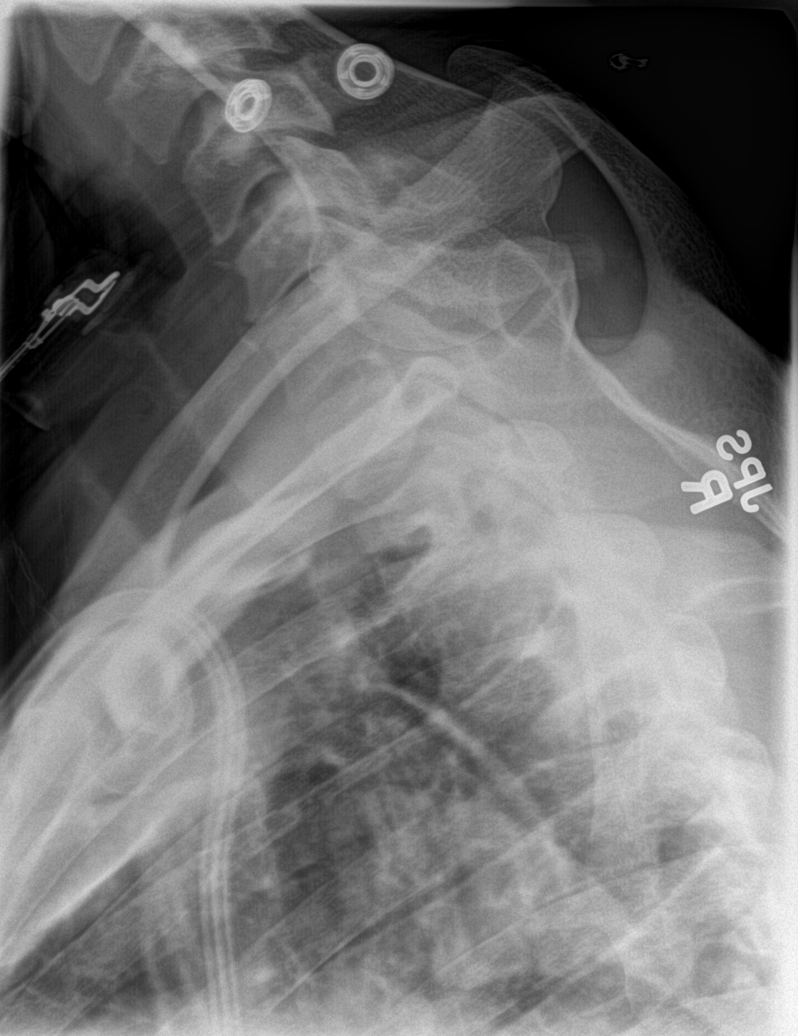

[c-spine obl (2 of 2)]
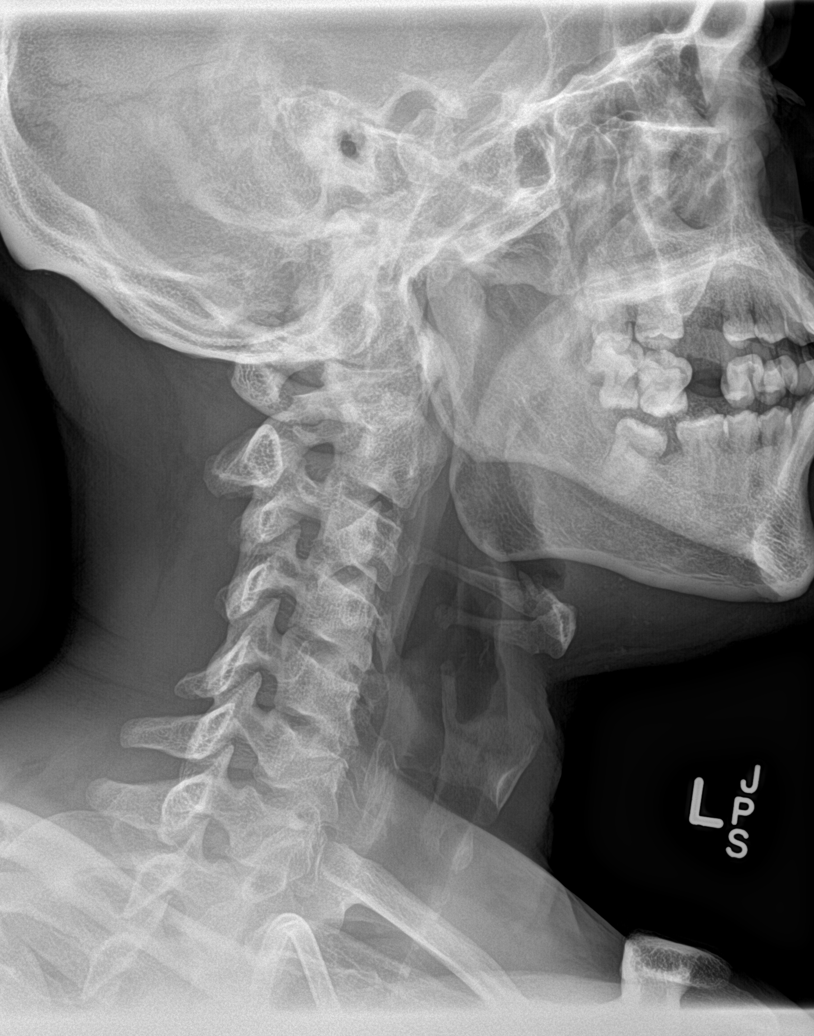

[6 of 6 positions shown; findings below may reference images not displayed]

FINDINGS: Frontal, lateral, open-mouth odontoid, and bilateral oblique views
were obtained. There is no fracture or spondylolisthesis.
Prevertebral soft tissues and predental space regions are normal.
There is moderate narrowing at C5-6. There is slight disc space
narrowing at C6-7. There is no appreciable exit foraminal narrowing
on the oblique views. There is calcification in the anterior
ligament at C4-5 and C5-6. There is a focal area of calcification in
the right carotid artery.
IMPRESSION: Areas of osteoarthritic change. No fracture or spondylolisthesis.
Focal carotid artery calcification on the right.

## 2017-02-25 NOTE — Progress Notes (Signed)
Pt refused to have his vital signs retaken.

## 2017-02-25 NOTE — Progress Notes (Signed)
Pt wants to leave AMA, explain to pt the risks of having stroke, heart attack and even death, verbalized understanding. Dr. Eliseo Squires notified via [place]. Pt took all belongings with him.

## 2017-02-26 DIAGNOSIS — N2581 Secondary hyperparathyroidism of renal origin: Secondary | ICD-10-CM | POA: Diagnosis not present

## 2017-02-26 DIAGNOSIS — D509 Iron deficiency anemia, unspecified: Secondary | ICD-10-CM | POA: Diagnosis not present

## 2017-02-26 DIAGNOSIS — N186 End stage renal disease: Secondary | ICD-10-CM | POA: Diagnosis not present

## 2017-03-02 ENCOUNTER — Emergency Department (HOSPITAL_COMMUNITY)
Admission: EM | Admit: 2017-03-02 | Discharge: 2017-03-02 | Disposition: A | Discharge status: Home / Self Care | Payer: Medicare Other | Attending: Emergency Medicine | Admitting: Emergency Medicine

## 2017-03-02 ENCOUNTER — Emergency Department (HOSPITAL_COMMUNITY): Payer: Medicare Other

## 2017-03-02 ENCOUNTER — Encounter (HOSPITAL_COMMUNITY): Payer: Self-pay | Admitting: Emergency Medicine

## 2017-03-02 DIAGNOSIS — E875 Hyperkalemia: Secondary | ICD-10-CM | POA: Diagnosis not present

## 2017-03-02 DIAGNOSIS — R1011 Right upper quadrant pain: Secondary | ICD-10-CM

## 2017-03-02 DIAGNOSIS — D649 Anemia, unspecified: Secondary | ICD-10-CM | POA: Diagnosis not present

## 2017-03-02 DIAGNOSIS — I5042 Chronic combined systolic (congestive) and diastolic (congestive) heart failure: Secondary | ICD-10-CM | POA: Insufficient documentation

## 2017-03-02 DIAGNOSIS — K529 Noninfective gastroenteritis and colitis, unspecified: Secondary | ICD-10-CM | POA: Diagnosis not present

## 2017-03-02 DIAGNOSIS — R7989 Other specified abnormal findings of blood chemistry: Secondary | ICD-10-CM | POA: Diagnosis not present

## 2017-03-02 DIAGNOSIS — N186 End stage renal disease: Secondary | ICD-10-CM | POA: Insufficient documentation

## 2017-03-02 DIAGNOSIS — K859 Acute pancreatitis without necrosis or infection, unspecified: Secondary | ICD-10-CM | POA: Diagnosis not present

## 2017-03-02 DIAGNOSIS — Z79899 Other long term (current) drug therapy: Secondary | ICD-10-CM | POA: Insufficient documentation

## 2017-03-02 DIAGNOSIS — Z87891 Personal history of nicotine dependence: Secondary | ICD-10-CM | POA: Diagnosis not present

## 2017-03-02 DIAGNOSIS — E1122 Type 2 diabetes mellitus with diabetic chronic kidney disease: Secondary | ICD-10-CM | POA: Diagnosis not present

## 2017-03-02 DIAGNOSIS — K828 Other specified diseases of gallbladder: Secondary | ICD-10-CM | POA: Diagnosis not present

## 2017-03-02 DIAGNOSIS — I132 Hypertensive heart and chronic kidney disease with heart failure and with stage 5 chronic kidney disease, or end stage renal disease: Secondary | ICD-10-CM | POA: Insufficient documentation

## 2017-03-02 DIAGNOSIS — N1330 Unspecified hydronephrosis: Secondary | ICD-10-CM | POA: Diagnosis not present

## 2017-03-02 DIAGNOSIS — R112 Nausea with vomiting, unspecified: Secondary | ICD-10-CM | POA: Diagnosis not present

## 2017-03-02 DIAGNOSIS — I429 Cardiomyopathy, unspecified: Secondary | ICD-10-CM | POA: Diagnosis not present

## 2017-03-02 DIAGNOSIS — R0600 Dyspnea, unspecified: Secondary | ICD-10-CM | POA: Diagnosis not present

## 2017-03-02 DIAGNOSIS — Z992 Dependence on renal dialysis: Secondary | ICD-10-CM | POA: Diagnosis not present

## 2017-03-02 DIAGNOSIS — K858 Other acute pancreatitis without necrosis or infection: Secondary | ICD-10-CM | POA: Diagnosis not present

## 2017-03-02 DIAGNOSIS — B999 Unspecified infectious disease: Secondary | ICD-10-CM | POA: Diagnosis not present

## 2017-03-02 DIAGNOSIS — R188 Other ascites: Secondary | ICD-10-CM | POA: Diagnosis not present

## 2017-03-02 DIAGNOSIS — C642 Malignant neoplasm of left kidney, except renal pelvis: Secondary | ICD-10-CM | POA: Diagnosis not present

## 2017-03-02 LAB — CBC
HCT: 23.9 % — ABNORMAL LOW (ref 39.0–52.0)
HEMOGLOBIN: 7.7 g/dL — AB (ref 13.0–17.0)
MCH: 26.8 pg (ref 26.0–34.0)
MCHC: 32.2 g/dL (ref 30.0–36.0)
MCV: 83.3 fL (ref 78.0–100.0)
Platelets: 212 10*3/uL (ref 150–400)
RBC: 2.87 MIL/uL — ABNORMAL LOW (ref 4.22–5.81)
RDW: 18.1 % — ABNORMAL HIGH (ref 11.5–15.5)
WBC: 5.6 10*3/uL (ref 4.0–10.5)

## 2017-03-02 LAB — BASIC METABOLIC PANEL
ANION GAP: 16 — AB (ref 5–15)
BUN: 62 mg/dL — ABNORMAL HIGH (ref 6–20)
CALCIUM: 7.8 mg/dL — AB (ref 8.9–10.3)
CHLORIDE: 98 mmol/L — AB (ref 101–111)
CO2: 25 mmol/L (ref 22–32)
CREATININE: 14.79 mg/dL — AB (ref 0.61–1.24)
GFR calc non Af Amer: 3 mL/min — ABNORMAL LOW (ref >60)
GFR, EST AFRICAN AMERICAN: 4 mL/min — AB (ref >60)
GLUCOSE: 87 mg/dL (ref 65–99)
Potassium: 5 mmol/L (ref 3.5–5.1)
Sodium: 139 mmol/L (ref 135–145)

## 2017-03-02 LAB — HEPATIC FUNCTION PANEL
ALBUMIN: 3.1 g/dL — AB (ref 3.5–5.0)
ALK PHOS: 118 U/L (ref 38–126)
ALT: 12 U/L — AB (ref 17–63)
AST: 22 U/L (ref 15–41)
Bilirubin, Direct: <0.1 mg/dL — ABNORMAL LOW (ref 0.1–0.5)
TOTAL PROTEIN: 7 g/dL (ref 6.5–8.1)
Total Bilirubin: 0.6 mg/dL (ref 0.3–1.2)

## 2017-03-02 LAB — I-STAT TROPONIN, ED
TROPONIN I, POC: 0.04 ng/mL (ref 0.00–0.08)
Troponin i, poc: 0.04 ng/mL (ref 0.00–0.08)

## 2017-03-02 LAB — TYPE AND SCREEN
ABO/RH(D): B POS
Antibody Screen: NEGATIVE

## 2017-03-02 LAB — LIPASE, BLOOD: Lipase: 35 U/L (ref 11–51)

## 2017-03-02 MED ORDER — FENTANYL CITRATE (PF) 100 MCG/2ML IJ SOLN
50.0000 ug | Freq: Once | INTRAMUSCULAR | Status: AC
  Administered 2017-03-02: 50 ug via INTRAVENOUS
  Filled 2017-03-02: qty 2

## 2017-03-02 MED ORDER — ONDANSETRON HCL 4 MG/2ML IJ SOLN
4.0000 mg | Freq: Once | INTRAMUSCULAR | Status: AC
  Administered 2017-03-02: 4 mg via INTRAVENOUS
  Filled 2017-03-02: qty 2

## 2017-03-02 NOTE — ED Triage Notes (Signed)
Pt c/o epigastric pain with n/v, that began yesterday. Denies shortness of breath/diaphoresis. States he has not missed dialysis.

## 2017-03-02 NOTE — ED Provider Notes (Signed)
[facility] DEPT Provider Note   CSN: [id number] Arrival date & time: 03/02/17  0092  By signing my name below, I, Frank [name], attest that this documentation has been prepared under the direction and in the presence of physician practitioner, Maricsa Sammons, Annie Main, MD. Electronically Signed: Dora [name], Scribe. 03/02/2017. 3:45 AM.  History   Chief Complaint Chief Complaint  Patient presents with  . Abdominal Pain   The history is provided by the patient. No language interpreter was used.    HPI Comments: Frank [name] is a 51 y.o. male with PMHx including ESRD on HD, CHF, HTN, DM2, chronic abdominal pain and anemia who presents to the Emergency Department for evaluation of a few hours of constant RUQ pain that radiates into the back. He states the pain awoke him from sleep and has persisted. He has had 3-4 associated episodes of nausea and vomiting. Patient has a history of chronic epigastric pain and notes that his current pain is completely different. He last attended dialysis on 8/10 and missed a session on 8/4. Patient was admitted to the hospital for anemia on 8/5 and left AMA on 8/7. Patient has been taking his regular medications as prescribed without any missed doses. No h/o appendectomy or cholecystectomy. Patient has not had any alcohol in the last 24 hours. He denies diarrhea, constipation, hematemesis, hematochezia, melena, testicular pain, or any other associated symptoms.  Per chart review, patient most recently had an abdominal CT last June which revealed no acute abnormalities.  Past Medical History:  Diagnosis Date  . Anemia   . Anxiety   . Chronic combined systolic and diastolic CHF (congestive heart failure) (HCC)    a. EF 20-25% by echo in 08/2015 b. echo 10/2015: EF 35-40%, diffuse HK, severe LAE, moderate RAE, small pericardial effusion  . Complication of anesthesia    itching, sore throat  . Depression   . Dialysis patient ([place])   . ESRD (end stage renal  disease) ([place])    due to HTN per patient, followed at [facility], s/p failed kidney transplant - dialysis Tue, Th, Sat  . Hyperkalemia 12/2015  . Hypertension   . Junctional rhythm    a. noted in 08/2015: hyperkalemic at that time  b. 12/2015: presented in junctional rhythm w/ K+ of 6.6. Resolved with improvement of K+ levels.  . Nonischemic cardiomyopathy ([place])    a. 08/2014: cath showing minimal CAD, but tortuous arteries noted.   . Personal history of DVT (deep vein thrombosis)/ PE 05/26/2016   In Oct 2015 had small subsemental LUL PE w/o DVT (LE dopplers neg) and was felt to be HD cath related, treated w coumadin.  IN May 2016 had small vein DVT (acute/subacute) in the R basilic/ brachial veins of the RUE, resumed on coumadin.  Had R sided HD cath at that time.    . Renal insufficiency   . Shortness of breath   . Type II diabetes mellitus (HCC)    No history per patient, but remains under history as A1c would not be accurate given on dialysis    Patient Active Problem List   Diagnosis Date Noted  . Anemia 02/24/2017  . Volume overload 01/13/2017  . Aortic atherosclerosis ([place]) 01/05/2017  . Abdominal pain 08/04/2016  . Uremia 06/07/2016  . Hyperkalemia 05/29/2016  . GERD (gastroesophageal reflux disease) 05/29/2016  . Personal history of DVT (deep vein thrombosis)/ PE 05/26/2016  . Nonischemic cardiomyopathy ([place]) 01/09/2016  . Bilateral low back pain without sciatica   . Renal cyst,  left 10/30/2015  . Constipation by delayed colonic transit 10/30/2015  . Acute on chronic combined systolic and diastolic congestive heart failure ([place]) 09/23/2015  . Chest pain 09/08/2015  . Adjustment disorder with mixed anxiety and depressed mood 08/20/2015  . Essential hypertension 01/02/2015  . Dyslipidemia   . Malignant hypertension 11/29/2014  . ESRD on hemodialysis ([place])   . Acute pulmonary edema (HCC)   . DM (diabetes mellitus), type 2, uncontrolled, with renal complications ([place])   .  Complex sleep apnea syndrome 05/05/2014  . Anemia of chronic kidney failure 06/24/2013  . Nausea and vomiting 06/24/2013    Past Surgical History:  Procedure Laterality Date  . CAPD INSERTION    . CAPD REMOVAL    . INGUINAL HERNIA REPAIR Right 02/14/2015   Procedure: REPAIR INCARCERATED RIGHT INGUINAL HERNIA;  Surgeon: [name], MD;  Location: [facility];  Service: General;  Laterality: Right;  . INSERTION OF DIALYSIS CATHETER Right 09/23/2015   Procedure: exchange of Right internal Dialysis Catheter.;  Surgeon: [name], MD;  Location: [facility];  Service: Vascular;  Laterality: Right;  . IR GENERIC HISTORICAL  07/16/2016   IR US GUIDE VASC ACCESS LEFT 07/16/2016 Corrie Mckusick, DO [facility]  . IR GENERIC HISTORICAL Left 07/16/2016   IR THROMBECTOMY AV FISTULA W/THROMBOLYSIS/PTA INC/SHUNT/IMG LEFT 07/16/2016 Corrie Mckusick, DO [facility]  . KIDNEY RECEIPIENT  2006   failed and started HD in March 2014  . LEFT HEART CATHETERIZATION WITH CORONARY ANGIOGRAM N/A 09/02/2014   Procedure: LEFT HEART CATHETERIZATION WITH CORONARY ANGIOGRAM;  Surgeon: [name], MD;  Location: [facility] CATH LAB;  Service: Cardiovascular;  Laterality: N/A;       Home Medications    Prior to Admission medications   Medication Sig Start Date End Date Taking? Authorizing Provider  amLODipine (NORVASC) 10 MG tablet Take 1 tablet (10 mg total) by mouth at bedtime. 12/01/16   Rice, Resa Miner, MD  cloNIDine (CATAPRES - DOSED IN MG/24 HR) 0.3 mg/24hr patch Place onto the skin once a week. On Wednesday 12/31/16   [provider]  hydrALAZINE (APRESOLINE) 100 MG tablet Take 1 tablet (100 mg total) by mouth 2 (two) times daily. 01/07/17   Ledell Noss, MD  isosorbide mononitrate (IMDUR) 60 MG 24 hr tablet Take 1 tablet (60 mg total) by mouth daily. 01/08/17   Ledell Noss, MD  metoCLOPramide (REGLAN) 5 MG tablet Take 1 tablet (5 mg total) by mouth every 8 (eight) hours as needed for nausea. 12/01/16   Rice,  Resa Miner, MD  pantoprazole (PROTONIX) 40 MG tablet Take 1 tablet (40 mg total) by mouth 2 (two) times daily before a meal. 12/01/16   Rice, Resa Miner, MD  sevelamer carbonate (RENVELA) 800 MG tablet Take 1 tablet (800 mg total) by mouth 3 (three) times daily with meals. Patient taking differently: Take 800 mg by mouth 3 (three) times daily with meals. 800 mg as needed with snack 01/06/17   Ledell Noss, MD  traMADol (ULTRAM) 50 MG tablet Take 1 tablet (50 mg total) by mouth every 12 (twelve) hours as needed for moderate pain or severe pain. 12/01/16   Collier Salina, MD    Family History Family History  Problem Relation Age of Onset  . Hypertension Other     Social History Social History  Substance Use Topics  . Smoking status: Former Smoker    Packs/day: 0.00    Years: 1.00    Types: Cigarettes  . Smokeless tobacco: Never Used  Comment: quit Jan 2014  . Alcohol use No     Allergies   Butalbital-apap-caffeine; Ferrlecit [na ferric gluc cplx in sucrose]; Minoxidil; Tylenol [acetaminophen]; and Darvocet [propoxyphene n-acetaminophen]   Review of Systems Review of Systems All other systems reviewed and are negative for acute change except as noted in the HPI. Physical Exam Updated Vital Signs BP (!) 155/106 (BP Location: Right Arm)   Pulse 73   Temp 98.1 F (36.7 C) (Oral)   Resp 20   Ht _0  (1.88 m)   Wt 165 lb (74.8 kg)   SpO2 100%   BMI 21.18 kg/m   Physical Exam  Constitutional: He is oriented to person, place, and time. He appears well-developed and well-nourished. No distress.  Uncomfortable.   HENT:  Head: Normocephalic and atraumatic.  Mouth/Throat: Oropharynx is clear and moist. No oropharyngeal exudate.  Eyes: Pupils are equal, round, and reactive to light. Conjunctivae and EOM are normal.  Neck: Normal range of motion. Neck supple.  No meningismus.  Cardiovascular: Normal rate, regular rhythm, normal heart sounds and intact distal pulses.     No murmur heard. Pulmonary/Chest: Effort normal. No respiratory distress.  Bibasilar crackles.  Abdominal: Soft. There is tenderness. There is no rebound and no guarding.  Epigastric and RUQ tenderness.  Musculoskeletal: Normal range of motion. He exhibits no edema or tenderness.  Neurological: He is alert and oriented to person, place, and time. No cranial nerve deficit. He exhibits normal muscle tone. Coordination normal.   5/5 strength throughout. CN 2-12 intact.Equal grip strength.   Skin: Skin is warm.  Psychiatric: He has a normal mood and affect. His behavior is normal.  Nursing note and vitals reviewed.  ED Treatments / Results  Labs (all labs ordered are listed, but only abnormal results are displayed) Labs Reviewed  BASIC METABOLIC PANEL - Abnormal; Notable for the following:       Result Value   Chloride 98 (*)    BUN 62 (*)    Creatinine, Ser 14.79 (*)    Calcium 7.8 (*)    GFR calc non Af Amer 3 (*)    GFR calc Af Amer 4 (*)    Anion gap 16 (*)    All other components within normal limits  CBC - Abnormal; Notable for the following:    RBC 2.87 (*)    Hemoglobin 7.7 (*)    HCT 23.9 (*)    RDW 18.1 (*)    All other components within normal limits  HEPATIC FUNCTION PANEL - Abnormal; Notable for the following:    Albumin 3.1 (*)    ALT 12 (*)    Bilirubin, Direct <0.1 (*)    All other components within normal limits  LIPASE, BLOOD  I-STAT TROPONIN, ED  I-STAT TROPONIN, ED  TYPE AND SCREEN    EKG  EKG Interpretation  Date/Time:  Sunday March 02 2017 00:48:40 EDT Ventricular Rate:  74 PR Interval:    QRS Duration: 96 QT Interval:  472 QTC Calculation: 523 R Axis:   -35 Text Interpretation:  sinus rhythm Left axis deviation Minimal voltage criteria for LVH, may be normal variant Nonspecific T wave abnormality , probably digitalis effect Prolonged QT Abnormal ECG No significant change was found Confirmed by Ezequiel Essex [phone]) on 03/02/2017 5:02:00  AM       Radiology Dg Chest 2 View  Result Date: 03/02/2017 CLINICAL DATA:  Epigastric pain and dyspnea EXAM: CHEST  2 VIEW COMPARISON:  Chest CT 08/06/2016 and CXR 01/25/2017  FINDINGS: Stable cardiomegaly with aortic atherosclerosis. Mild interstitial opacities favored to represent interstitial edema. Tiny nodular densities in the left upper lobe are favored to represent branch points for pulmonary vessels. No pulmonary nodules are seen on recent CT. No pneumonic consolidation, effusion or pneumothorax. No acute nor suspicious osseous abnormalities. IMPRESSION: Stable cardiomegaly with aortic atherosclerosis and probable mild interstitial edema. Two tiny nodular densities in left upper lobe likely to reflect branch points for pulmonary vessels. Electronically Signed   By: Ashley Royalty M.D.   On: 03/02/2017 01:21    Procedures Procedures (including critical care time)  DIAGNOSTIC STUDIES: Oxygen Saturation is 100% on RA, normal by my interpretation.    COORDINATION OF CARE: 3:42 AM Discussed treatment plan with pt at bedside and pt agreed to plan.  Medications Ordered in ED Medications  fentaNYL (SUBLIMAZE) injection 50 mcg (not administered)  ondansetron (ZOFRAN) injection 4 mg (not administered)     Initial Impression / Assessment and Plan / ED Course  I have reviewed the triage vital signs and the nursing notes.  Pertinent labs & imaging results that were available during my care of the patient were reviewed by me and considered in my medical decision making (see chart for details).     Dialysis patient will known to the ED presenting with right upper quadrant and epigastric pain that is different than his chronic abdominal pain. Associated with nausea and vomiting. Denies chest pain. Last episode of dialysis yesterday.  LFTs and lipase are normal. Potassium normal. Worsening Anemia noted. Patient continues to refuse rectal exam. Denies dark or bloody stools.  Troponin  negative x2. EKG stable.   Ultrasound results discussed with Dr. Ninfa Linden of Gen. surgery. He states this is typical for dialysis patients. Recommends medical admission and HIDA scan for further evaluation.   Patient does not want to be admitted. He does not want to stay in the hospital for HIDA scan. He does not want further workup of his anemia. 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 He will leave against medical advice and appears to have capacity to make medical decisions.  Patient requesting to leave against medical advice. He is alert and oriented x3 and appears to have decision making capacity. Denies suicidal or homicidal ideation.  Discussed that admission and further testing is recommended and emergent medical conditions have not been ruled out. Discussed that leaving against medical advice may result in clinical deterioration and possible death.  Final Clinical Impressions(s) / ED Diagnoses   Final diagnoses:  RUQ pain  Anemia, unspecified type    New Prescriptions New Prescriptions   No medications on file   I personally performed the services described in this documentation, which was scribed in my presence. The recorded information has been reviewed and is accurate.    Ezequiel Essex, MD 03/02/17 [phone]

## 2017-03-02 NOTE — Discharge Instructions (Signed)
HIDA scan is recommended for your gallbladder. You also need further evaluation of your anemia. Return to the ED if you wish to be reevaluated.

## 2017-03-03 DIAGNOSIS — R109 Unspecified abdominal pain: Secondary | ICD-10-CM | POA: Diagnosis not present

## 2017-03-03 DIAGNOSIS — I509 Heart failure, unspecified: Secondary | ICD-10-CM | POA: Diagnosis present

## 2017-03-03 DIAGNOSIS — I160 Hypertensive urgency: Secondary | ICD-10-CM | POA: Diagnosis present

## 2017-03-03 DIAGNOSIS — R188 Other ascites: Secondary | ICD-10-CM | POA: Diagnosis not present

## 2017-03-03 DIAGNOSIS — N2889 Other specified disorders of kidney and ureter: Secondary | ICD-10-CM | POA: Diagnosis not present

## 2017-03-03 DIAGNOSIS — Z886 Allergy status to analgesic agent status: Secondary | ICD-10-CM | POA: Diagnosis not present

## 2017-03-03 DIAGNOSIS — T8612 Kidney transplant failure: Secondary | ICD-10-CM | POA: Diagnosis present

## 2017-03-03 DIAGNOSIS — N186 End stage renal disease: Secondary | ICD-10-CM | POA: Diagnosis present

## 2017-03-03 DIAGNOSIS — Z86718 Personal history of other venous thrombosis and embolism: Secondary | ICD-10-CM | POA: Diagnosis not present

## 2017-03-03 DIAGNOSIS — Z992 Dependence on renal dialysis: Secondary | ICD-10-CM | POA: Diagnosis not present

## 2017-03-03 DIAGNOSIS — R748 Abnormal levels of other serum enzymes: Secondary | ICD-10-CM | POA: Diagnosis not present

## 2017-03-03 DIAGNOSIS — K828 Other specified diseases of gallbladder: Secondary | ICD-10-CM | POA: Diagnosis not present

## 2017-03-03 DIAGNOSIS — G4739 Other sleep apnea: Secondary | ICD-10-CM | POA: Diagnosis present

## 2017-03-03 DIAGNOSIS — N1330 Unspecified hydronephrosis: Secondary | ICD-10-CM | POA: Diagnosis not present

## 2017-03-03 DIAGNOSIS — Z79891 Long term (current) use of opiate analgesic: Secondary | ICD-10-CM | POA: Diagnosis not present

## 2017-03-03 DIAGNOSIS — K859 Acute pancreatitis without necrosis or infection, unspecified: Secondary | ICD-10-CM | POA: Diagnosis present

## 2017-03-03 DIAGNOSIS — I2510 Atherosclerotic heart disease of native coronary artery without angina pectoris: Secondary | ICD-10-CM | POA: Diagnosis present

## 2017-03-03 DIAGNOSIS — I132 Hypertensive heart and chronic kidney disease with heart failure and with stage 5 chronic kidney disease, or end stage renal disease: Secondary | ICD-10-CM | POA: Diagnosis present

## 2017-03-03 DIAGNOSIS — R1011 Right upper quadrant pain: Secondary | ICD-10-CM | POA: Diagnosis not present

## 2017-03-03 DIAGNOSIS — E875 Hyperkalemia: Secondary | ICD-10-CM | POA: Diagnosis present

## 2017-03-03 DIAGNOSIS — G4733 Obstructive sleep apnea (adult) (pediatric): Secondary | ICD-10-CM | POA: Diagnosis present

## 2017-03-03 DIAGNOSIS — R945 Abnormal results of liver function studies: Secondary | ICD-10-CM | POA: Diagnosis not present

## 2017-03-03 DIAGNOSIS — C642 Malignant neoplasm of left kidney, except renal pelvis: Secondary | ICD-10-CM | POA: Diagnosis present

## 2017-03-03 DIAGNOSIS — R0781 Pleurodynia: Secondary | ICD-10-CM | POA: Diagnosis present

## 2017-03-03 DIAGNOSIS — Z79899 Other long term (current) drug therapy: Secondary | ICD-10-CM | POA: Diagnosis not present

## 2017-03-03 DIAGNOSIS — Z8042 Family history of malignant neoplasm of prostate: Secondary | ICD-10-CM | POA: Diagnosis not present

## 2017-03-03 DIAGNOSIS — Z833 Family history of diabetes mellitus: Secondary | ICD-10-CM | POA: Diagnosis not present

## 2017-03-03 DIAGNOSIS — B999 Unspecified infectious disease: Secondary | ICD-10-CM | POA: Diagnosis not present

## 2017-03-03 DIAGNOSIS — D631 Anemia in chronic kidney disease: Secondary | ICD-10-CM | POA: Diagnosis present

## 2017-03-03 DIAGNOSIS — I429 Cardiomyopathy, unspecified: Secondary | ICD-10-CM | POA: Diagnosis present

## 2017-03-03 DIAGNOSIS — E1122 Type 2 diabetes mellitus with diabetic chronic kidney disease: Secondary | ICD-10-CM | POA: Diagnosis present

## 2017-03-03 DIAGNOSIS — K529 Noninfective gastroenteritis and colitis, unspecified: Secondary | ICD-10-CM | POA: Diagnosis present

## 2017-03-03 DIAGNOSIS — Z888 Allergy status to other drugs, medicaments and biological substances status: Secondary | ICD-10-CM | POA: Diagnosis not present

## 2017-03-08 DIAGNOSIS — N186 End stage renal disease: Secondary | ICD-10-CM | POA: Diagnosis not present

## 2017-03-08 DIAGNOSIS — N2581 Secondary hyperparathyroidism of renal origin: Secondary | ICD-10-CM | POA: Diagnosis not present

## 2017-03-08 DIAGNOSIS — D509 Iron deficiency anemia, unspecified: Secondary | ICD-10-CM | POA: Diagnosis not present

## 2017-03-09 ENCOUNTER — Encounter (HOSPITAL_COMMUNITY): Payer: Self-pay | Admitting: Emergency Medicine

## 2017-03-09 DIAGNOSIS — M79601 Pain in right arm: Secondary | ICD-10-CM

## 2017-03-09 DIAGNOSIS — Z87891 Personal history of nicotine dependence: Secondary | ICD-10-CM

## 2017-03-09 DIAGNOSIS — I132 Hypertensive heart and chronic kidney disease with heart failure and with stage 5 chronic kidney disease, or end stage renal disease: Secondary | ICD-10-CM

## 2017-03-09 DIAGNOSIS — I120 Hypertensive chronic kidney disease with stage 5 chronic kidney disease or end stage renal disease: Secondary | ICD-10-CM | POA: Diagnosis not present

## 2017-03-09 DIAGNOSIS — N2581 Secondary hyperparathyroidism of renal origin: Secondary | ICD-10-CM | POA: Diagnosis not present

## 2017-03-09 DIAGNOSIS — Z992 Dependence on renal dialysis: Secondary | ICD-10-CM | POA: Insufficient documentation

## 2017-03-09 DIAGNOSIS — I429 Cardiomyopathy, unspecified: Secondary | ICD-10-CM | POA: Diagnosis not present

## 2017-03-09 DIAGNOSIS — Z79899 Other long term (current) drug therapy: Secondary | ICD-10-CM

## 2017-03-09 DIAGNOSIS — I5042 Chronic combined systolic (congestive) and diastolic (congestive) heart failure: Secondary | ICD-10-CM | POA: Insufficient documentation

## 2017-03-09 DIAGNOSIS — E1122 Type 2 diabetes mellitus with diabetic chronic kidney disease: Secondary | ICD-10-CM

## 2017-03-09 DIAGNOSIS — I82A11 Acute embolism and thrombosis of right axillary vein: Secondary | ICD-10-CM | POA: Diagnosis not present

## 2017-03-09 DIAGNOSIS — N186 End stage renal disease: Secondary | ICD-10-CM

## 2017-03-09 DIAGNOSIS — I82621 Acute embolism and thrombosis of deep veins of right upper extremity: Secondary | ICD-10-CM | POA: Diagnosis not present

## 2017-03-09 DIAGNOSIS — I160 Hypertensive urgency: Secondary | ICD-10-CM | POA: Diagnosis not present

## 2017-03-09 DIAGNOSIS — R600 Localized edema: Secondary | ICD-10-CM | POA: Diagnosis not present

## 2017-03-09 DIAGNOSIS — M7989 Other specified soft tissue disorders: Secondary | ICD-10-CM | POA: Diagnosis not present

## 2017-03-09 NOTE — ED Triage Notes (Addendum)
Pt c/o R arm pain/swelling/redness onset yesterday, +CMS, strong pulse.  pt states he was told to come to ED by his MD to r/o DVT. Denies injury.  HD pt T,TH,S, full treatment yesterday per patient.

## 2017-03-10 ENCOUNTER — Emergency Department (HOSPITAL_COMMUNITY)
Admission: EM | Admit: 2017-03-10 | Discharge: 2017-03-10 | Disposition: A | Discharge status: Home / Self Care | Payer: Medicare Other | Source: Home / Self Care | Attending: Emergency Medicine | Admitting: Emergency Medicine

## 2017-03-10 ENCOUNTER — Emergency Department (HOSPITAL_COMMUNITY): Payer: Medicare Other

## 2017-03-10 ENCOUNTER — Ambulatory Visit (HOSPITAL_COMMUNITY): Admission: RE | Admit: 2017-03-10 | Payer: Medicare Other | Source: Ambulatory Visit

## 2017-03-10 DIAGNOSIS — R600 Localized edema: Secondary | ICD-10-CM | POA: Diagnosis not present

## 2017-03-10 DIAGNOSIS — M79601 Pain in right arm: Secondary | ICD-10-CM

## 2017-03-10 LAB — APTT: APTT: 46 s — AB (ref 24–36)

## 2017-03-10 LAB — COMPREHENSIVE METABOLIC PANEL
ALBUMIN: 2.8 g/dL — AB (ref 3.5–5.0)
ALK PHOS: 116 U/L (ref 38–126)
ALT: 13 U/L — ABNORMAL LOW (ref 17–63)
AST: 25 U/L (ref 15–41)
Anion gap: 12 (ref 5–15)
BILIRUBIN TOTAL: 0.7 mg/dL (ref 0.3–1.2)
BUN: 42 mg/dL — AB (ref 6–20)
CALCIUM: 7.8 mg/dL — AB (ref 8.9–10.3)
CO2: 29 mmol/L (ref 22–32)
Chloride: 100 mmol/L — ABNORMAL LOW (ref 101–111)
Creatinine, Ser: 10.7 mg/dL — ABNORMAL HIGH (ref 0.61–1.24)
GFR calc Af Amer: 6 mL/min — ABNORMAL LOW (ref >60)
GFR calc non Af Amer: 5 mL/min — ABNORMAL LOW (ref >60)
GLUCOSE: 99 mg/dL (ref 65–99)
Potassium: 4.7 mmol/L (ref 3.5–5.1)
Sodium: 141 mmol/L (ref 135–145)
TOTAL PROTEIN: 6.9 g/dL (ref 6.5–8.1)

## 2017-03-10 LAB — CBC WITH DIFFERENTIAL/PLATELET
BASOS ABS: 0 10*3/uL (ref 0.0–0.1)
BASOS PCT: 0 %
Eosinophils Absolute: 0.4 10*3/uL (ref 0.0–0.7)
Eosinophils Relative: 7 %
HEMATOCRIT: 26.1 % — AB (ref 39.0–52.0)
HEMOGLOBIN: 8.1 g/dL — AB (ref 13.0–17.0)
Lymphocytes Relative: 19 %
Lymphs Abs: 1.1 10*3/uL (ref 0.7–4.0)
MCH: 26.8 pg (ref 26.0–34.0)
MCHC: 31 g/dL (ref 30.0–36.0)
MCV: 86.4 fL (ref 78.0–100.0)
Monocytes Absolute: 0.5 10*3/uL (ref 0.1–1.0)
Monocytes Relative: 9 %
NEUTROS ABS: 3.7 10*3/uL (ref 1.7–7.7)
NEUTROS PCT: 65 %
Platelets: 211 10*3/uL (ref 150–400)
RBC: 3.02 MIL/uL — AB (ref 4.22–5.81)
RDW: 19.6 % — ABNORMAL HIGH (ref 11.5–15.5)
WBC: 5.7 10*3/uL (ref 4.0–10.5)

## 2017-03-10 LAB — PROTIME-INR
INR: 1.17
Prothrombin Time: 15 seconds (ref 11.4–15.2)

## 2017-03-10 MED ORDER — OXYCODONE HCL 5 MG PO TABS
10.0000 mg | ORAL_TABLET | Freq: Once | ORAL | Status: AC
  Administered 2017-03-10: 10 mg via ORAL
  Filled 2017-03-10: qty 2

## 2017-03-10 NOTE — ED Notes (Signed)
Patient transported to X-ray

## 2017-03-10 NOTE — ED Notes (Signed)
Pt at [facility], states "just making sure you haven't forgot about me" Informed him there were 4 ppl ahead. Pt states "thats unbelievable, there should only be one" Informed pt again that there is 4 ppl ahead of him.

## 2017-03-10 NOTE — Discharge Instructions (Signed)
Please return for the ultrasound of your right arm to rule out DVT.

## 2017-03-10 NOTE — ED Provider Notes (Signed)
TIME SEEN: 3:26 AM  CHIEF COMPLAINT: Right arm pain  HPI: Patient is a 51 year old male with history of end-stage renal disease on hemodialysis, hypertension, CHF, chronic anemia who presents emergency department with complaints of right arm pain for the past 2 days. Has had pain, swelling and bruising to the arm. Denies fevers, redness or warmth. No known injury to the arm. He is right-hand dominant. No numbness or weakness. States he has had a previous dialysis catheter in the right side of his chest but that was over 2 years ago. He hasn't had any other procedures, IVs, central lines placed on the side recently. His dialysis graft is in his left upper extremity. He has not missed any recent dialysis. He denies chest pain or shortness of breath. States he has had a previous DVT and an upper extremity after having a dialysis catheter placed. He is no longer on anticoagulation.  ROS: See HPI Constitutional: no fever  Eyes: no drainage  ENT: no runny nose   Cardiovascular:  no chest pain  Resp: no SOB  GI: no vomiting GU: no dysuria Integumentary: no rash  Allergy: no hives  Musculoskeletal: no leg swelling  Neurological: no slurred speech ROS otherwise negative  PAST MEDICAL HISTORY/PAST SURGICAL HISTORY:  Past Medical History:  Diagnosis Date  . Anemia   . Anxiety   . Chronic combined systolic and diastolic CHF (congestive heart failure) (HCC)    a. EF 20-25% by echo in 08/2015 b. echo 10/2015: EF 35-40%, diffuse HK, severe LAE, moderate RAE, small pericardial effusion  . Complication of anesthesia    itching, sore throat  . Depression   . Dialysis patient ([place])   . ESRD (end stage renal disease) ([place])    due to HTN per patient, followed at [facility], s/p failed kidney transplant - dialysis Tue, Th, Sat  . Hyperkalemia 12/2015  . Hypertension   . Junctional rhythm    a. noted in 08/2015: hyperkalemic at that time  b. 12/2015: presented in junctional rhythm w/ K+ of 6.6. Resolved  with improvement of K+ levels.  . Nonischemic cardiomyopathy ([place])    a. 08/2014: cath showing minimal CAD, but tortuous arteries noted.   . Personal history of DVT (deep vein thrombosis)/ PE 05/26/2016   In Oct 2015 had small subsemental LUL PE w/o DVT (LE dopplers neg) and was felt to be HD cath related, treated w coumadin.  IN May 2016 had small vein DVT (acute/subacute) in the R basilic/ brachial veins of the RUE, resumed on coumadin.  Had R sided HD cath at that time.    . Renal insufficiency   . Shortness of breath   . Type II diabetes mellitus (HCC)    No history per patient, but remains under history as A1c would not be accurate given on dialysis    MEDICATIONS:  Prior to Admission medications   Medication Sig Start Date End Date Taking? Authorizing Provider  amLODipine (NORVASC) 10 MG tablet Take 1 tablet (10 mg total) by mouth at bedtime. 12/01/16   Rice, Resa Miner, MD  cloNIDine (CATAPRES - DOSED IN MG/24 HR) 0.3 mg/24hr patch Place onto the skin once a week. On Wednesday 12/31/16   [provider]  hydrALAZINE (APRESOLINE) 100 MG tablet Take 1 tablet (100 mg total) by mouth 2 (two) times daily. 01/07/17   Ledell Noss, MD  isosorbide mononitrate (IMDUR) 60 MG 24 hr tablet Take 1 tablet (60 mg total) by mouth daily. 01/08/17   Ledell Noss, MD  metoCLOPramide (REGLAN) 5 MG tablet Take 1 tablet (5 mg total) by mouth every 8 (eight) hours as needed for nausea. 12/01/16   Rice, Resa Miner, MD  pantoprazole (PROTONIX) 40 MG tablet Take 1 tablet (40 mg total) by mouth 2 (two) times daily before a meal. 12/01/16   Rice, Resa Miner, MD  sevelamer carbonate (RENVELA) 800 MG tablet Take 1 tablet (800 mg total) by mouth 3 (three) times daily with meals. Patient taking differently: Take 800 mg by mouth 3 (three) times daily with meals. 800 mg as needed with snack 01/06/17   Ledell Noss, MD  traMADol (ULTRAM) 50 MG tablet Take 1 tablet (50 mg total) by mouth every 12 (twelve) hours as  needed for moderate pain or severe pain. 12/01/16   Collier Salina, MD    ALLERGIES:  Allergies  Allergen Reactions  . Butalbital-Apap-Caffeine Shortness Of Breath, Swelling and Other (See Comments)    Swelling in throat  . Ferrlecit [Na Ferric Gluc Cplx In Sucrose] Shortness Of Breath, Swelling and Other (See Comments)    Swelling in throat, tolerates Venofor  . Minoxidil Shortness Of Breath  . Tylenol [Acetaminophen] Anaphylaxis and Swelling  . Darvocet [Propoxyphene N-Acetaminophen] Hives    SOCIAL HISTORY:  Social History  Substance Use Topics  . Smoking status: Former Smoker    Packs/day: 0.00    Years: 1.00    Types: Cigarettes  . Smokeless tobacco: Never Used     Comment: quit Jan 2014  . Alcohol use No    FAMILY HISTORY: Family History  Problem Relation Age of Onset  . Hypertension Other     EXAM: BP (!) 179/109 (BP Location: Left Leg)   Pulse 93   Temp 98.3 F (36.8 C) (Oral)   Resp 16   SpO2 96%  CONSTITUTIONAL: Alert and oriented and responds appropriately to questions. Chronically ill-appearing, does appear comfortable, afebrile and nontoxic, calm and cooperative HEAD: Normocephalic EYES: Conjunctivae clear, pupils appear equal, EOMI ENT: normal nose; moist mucous membranes NECK: Supple, no meningismus, no nuchal rigidity, no LAD  CARD: RRR; S1 and S2 appreciated; no murmurs, no clicks, no rubs, no gallops RESP: Normal chest excursion without splinting or tachypnea; breath sounds clear and equal bilaterally; no wheezes, no rhonchi, no rales, no hypoxia or respiratory distress, speaking full sentences ABD/GI: Normal bowel sounds; non-distended; soft, non-tender, no rebound, no guarding, no peritoneal signs, no hepatosplenomegaly BACK:  The back appears normal and is non-tender to palpation, there is no CVA tenderness EXT: Patient does have ecchymosis noted to the volar aspect of the right forearm with significant tenderness. His compartments are all  soft. He has 2+ radial, ulnar and brachial pulses in the right arm. He has a left upper extremity fistula without erythema, warmth or tenderness. No bony deformity noted of the right arm. Normal grip strength on the right side. Reports normal sensation diffusely. Normal ROM in all joints; otherwise extremities are non-tender to palpation; no edema; normal capillary refill; no cyanosis, no calf tenderness or swelling    SKIN: Normal color for age and race; warm; no rash NEURO: Moves all extremities equally PSYCH: The patient's mood and manner are appropriate. Grooming and personal hygiene are appropriate.  MEDICAL DECISION MAKING: Patient here with right arm pain. He does have significant ecchymosis noted to the volar aspect of the forearm but no obvious bony deformity. There is no redness or warmth to suggest infection. He has no fever. He denies being on antiplatelets or anticoagulants. Has had a  previous DVT in this arm after he had a right-sided dialysis catheter several years ago. He denies chest pain or shortness of breath. No sign of compartment syndrome. No joint effusion. No sign of septic arthritis or gout. This does not look like cellulitis or an abscess. Will check labs to ensure no coagulopathy, significant thrombocytopenia. He has no other complaints of easy bruising or bleeding. Will obtain x-ray of the forearm. I have recommended venous Doppler in the morning. Patient states he will have to go to work and then come back later today for the ultrasound. Given he does appear uncomfortable and has significant bruising to this arm I will give him pain medication in the emergency department.  ED PROGRESS: Patient's labs appear to be at his baseline. He has chronic anemia which is unchanged. Potassium normal. INR normal. His platelets are 211,000. His APTT is slightly elevated which he has had previously. He does receive heparin at dialysis. He is mildly hypertensive here which is also baseline for  him. His pain is been well controlled with oxycodone. He states he has to leave for work and will come back later today for venous Doppler to rule out DVT. He does have a primary care physician for follow-up. X-ray shows no fracture. Again no sign of compartment syndrome or infection on exam. He is neurovascular intact distally.  Given area of ecchymosis, I will hold off on anticoagulating patient. I feel he is safe to have a Doppler later today. He reports he will come back in 8 AM. He is not having chest pain or shortness of breath.   At this time, I do not feel there is any life-threatening condition present. I have reviewed and discussed all results (EKG, imaging, lab, urine as appropriate) and exam findings with patient/family. I have reviewed nursing notes and appropriate previous records.  I feel the patient is safe to be discharged home without further emergent workup and can continue workup as an outpatient as needed. Discussed usual and customary return precautions. Patient/family verbalize understanding and are comfortable with this plan.  Outpatient follow-up has been provided if needed. All questions have been answered.      Ward, Delice Bison, DO 03/10/17 [phone]

## 2017-03-11 ENCOUNTER — Emergency Department (HOSPITAL_COMMUNITY)
Admit: 2017-03-11 | Discharge: 2017-03-11 | Disposition: A | Discharge status: Home / Self Care | Payer: Medicare Other | Attending: Emergency Medicine | Admitting: Emergency Medicine

## 2017-03-11 ENCOUNTER — Emergency Department (HOSPITAL_COMMUNITY)
Admission: EM | Admit: 2017-03-11 | Discharge: 2017-03-11 | Discharge status: Against Medical Advice | Payer: Medicare Other | Source: Home / Self Care | Attending: Emergency Medicine | Admitting: Emergency Medicine

## 2017-03-11 ENCOUNTER — Encounter (HOSPITAL_COMMUNITY): Payer: Self-pay | Admitting: Emergency Medicine

## 2017-03-11 ENCOUNTER — Ambulatory Visit (HOSPITAL_COMMUNITY): Admission: RE | Admit: 2017-03-11 | Payer: Medicare Other | Source: Ambulatory Visit

## 2017-03-11 DIAGNOSIS — I132 Hypertensive heart and chronic kidney disease with heart failure and with stage 5 chronic kidney disease, or end stage renal disease: Secondary | ICD-10-CM | POA: Insufficient documentation

## 2017-03-11 DIAGNOSIS — Z86718 Personal history of other venous thrombosis and embolism: Secondary | ICD-10-CM | POA: Insufficient documentation

## 2017-03-11 DIAGNOSIS — I82A11 Acute embolism and thrombosis of right axillary vein: Secondary | ICD-10-CM

## 2017-03-11 DIAGNOSIS — Z79899 Other long term (current) drug therapy: Secondary | ICD-10-CM | POA: Insufficient documentation

## 2017-03-11 DIAGNOSIS — E1122 Type 2 diabetes mellitus with diabetic chronic kidney disease: Secondary | ICD-10-CM | POA: Insufficient documentation

## 2017-03-11 DIAGNOSIS — N186 End stage renal disease: Secondary | ICD-10-CM | POA: Insufficient documentation

## 2017-03-11 DIAGNOSIS — M7989 Other specified soft tissue disorders: Secondary | ICD-10-CM

## 2017-03-11 DIAGNOSIS — Z87891 Personal history of nicotine dependence: Secondary | ICD-10-CM

## 2017-03-11 DIAGNOSIS — I5042 Chronic combined systolic (congestive) and diastolic (congestive) heart failure: Secondary | ICD-10-CM

## 2017-03-11 DIAGNOSIS — Z992 Dependence on renal dialysis: Secondary | ICD-10-CM

## 2017-03-11 HISTORY — DX: Personal history of other venous thrombosis and embolism: Z86.718

## 2017-03-11 MED ORDER — ISOSORBIDE MONONITRATE ER 60 MG PO TB24
60.0000 mg | ORAL_TABLET | Freq: Every day | ORAL | Status: DC
  Administered 2017-03-11: 60 mg via ORAL
  Filled 2017-03-11: qty 1

## 2017-03-11 MED ORDER — AMLODIPINE BESYLATE 5 MG PO TABS
10.0000 mg | ORAL_TABLET | Freq: Once | ORAL | Status: DC
  Filled 2017-03-11: qty 2

## 2017-03-11 MED ORDER — CLONIDINE HCL 0.3 MG/24HR TD PTWK
0.3000 mg | MEDICATED_PATCH | TRANSDERMAL | Status: DC
  Administered 2017-03-11: 0.3 mg via TRANSDERMAL
  Filled 2017-03-11: qty 1

## 2017-03-11 MED ORDER — TRAMADOL HCL 50 MG PO TABS
50.0000 mg | ORAL_TABLET | Freq: Once | ORAL | Status: AC
  Administered 2017-03-11: 50 mg via ORAL
  Filled 2017-03-11: qty 1

## 2017-03-11 MED ORDER — ONDANSETRON HCL 4 MG/2ML IJ SOLN
4.0000 mg | Freq: Once | INTRAMUSCULAR | Status: AC
  Administered 2017-03-11: 4 mg via INTRAVENOUS
  Filled 2017-03-11: qty 2

## 2017-03-11 MED ORDER — HYDRALAZINE HCL 50 MG PO TABS
100.0000 mg | ORAL_TABLET | Freq: Once | ORAL | Status: AC
  Administered 2017-03-11: 100 mg via ORAL
  Filled 2017-03-11: qty 2

## 2017-03-11 NOTE — ED Provider Notes (Signed)
[facility] DEPT Provider Note   CSN: [id number] Arrival date & time: 03/11/17  0001     History   Chief Complaint Chief Complaint  Patient presents with  . Arm Swelling    HPI Frank [name] is a 51 y.o. male.  HPI  This is a 51 year old male with end-stage renal disease on dialysis Tuesday, Thursday, Saturday, nonischemic cardiomyopathy, hyperkalemia, hypertension, DVT who presents with right upper extremity pain and swelling. Was seen and evaluated approximately 24 hours ago for the same. At that time he had a full workup and was instructed to return for right upper extremity Dopplers. He did not do this.  He states that he woke up tonight with increasing pain. He has been applying ice. Currently he rates the pain 8 out of 10. He denies any fevers, chest pain, or shortness of breath. He does have a history of DVT but is not on anticoagulation. He is due for dialysis later today at 44.  I reviewed the patient's chart. He had reassuring workup less than 24 hours ago including lab work.  Past Medical History:  Diagnosis Date  . Anemia   . Anxiety   . Chronic combined systolic and diastolic CHF (congestive heart failure) (HCC)    a. EF 20-25% by echo in 08/2015 b. echo 10/2015: EF 35-40%, diffuse HK, severe LAE, moderate RAE, small pericardial effusion  . Complication of anesthesia    itching, sore throat  . Depression   . Dialysis patient ([place])   . ESRD (end stage renal disease) ([place])    due to HTN per patient, followed at [facility], s/p failed kidney transplant - dialysis Tue, Th, Sat  . Hyperkalemia 12/2015  . Hypertension   . Junctional rhythm    a. noted in 08/2015: hyperkalemic at that time  b. 12/2015: presented in junctional rhythm w/ K+ of 6.6. Resolved with improvement of K+ levels.  . Nonischemic cardiomyopathy ([place])    a. 08/2014: cath showing minimal CAD, but tortuous arteries noted.   . Personal history of DVT (deep vein thrombosis)/ PE 05/26/2016   In Oct 2015  had small subsemental LUL PE w/o DVT (LE dopplers neg) and was felt to be HD cath related, treated w coumadin.  IN May 2016 had small vein DVT (acute/subacute) in the R basilic/ brachial veins of the RUE, resumed on coumadin.  Had R sided HD cath at that time.    . Renal insufficiency   . Shortness of breath   . Type II diabetes mellitus (HCC)    No history per patient, but remains under history as A1c would not be accurate given on dialysis    Patient Active Problem List   Diagnosis Date Noted  . Anemia 02/24/2017  . Volume overload 01/13/2017  . Aortic atherosclerosis ([place]) 01/05/2017  . Abdominal pain 08/04/2016  . Uremia 06/07/2016  . Hyperkalemia 05/29/2016  . GERD (gastroesophageal reflux disease) 05/29/2016  . Personal history of DVT (deep vein thrombosis)/ PE 05/26/2016  . Nonischemic cardiomyopathy ([place]) 01/09/2016  . Bilateral low back pain without sciatica   . Renal cyst, left 10/30/2015  . Constipation by delayed colonic transit 10/30/2015  . Acute on chronic combined systolic and diastolic congestive heart failure ([place]) 09/23/2015  . Chest pain 09/08/2015  . Adjustment disorder with mixed anxiety and depressed mood 08/20/2015  . Essential hypertension 01/02/2015  . Dyslipidemia   . Malignant hypertension 11/29/2014  . ESRD on hemodialysis ([place])   . Acute pulmonary edema (HCC)   . DM (  diabetes mellitus), type 2, uncontrolled, with renal complications ([place])   . Complex sleep apnea syndrome 05/05/2014  . Anemia of chronic kidney failure 06/24/2013  . Nausea and vomiting 06/24/2013    Past Surgical History:  Procedure Laterality Date  . CAPD INSERTION    . CAPD REMOVAL    . INGUINAL HERNIA REPAIR Right 02/14/2015   Procedure: REPAIR INCARCERATED RIGHT INGUINAL HERNIA;  Surgeon: [name], MD;  Location: [facility];  Service: General;  Laterality: Right;  . INSERTION OF DIALYSIS CATHETER Right 09/23/2015   Procedure: exchange of Right internal Dialysis Catheter.;  Surgeon:  [name], MD;  Location: [facility];  Service: Vascular;  Laterality: Right;  . IR GENERIC HISTORICAL  07/16/2016   IR US GUIDE VASC ACCESS LEFT 07/16/2016 Corrie Mckusick, DO [facility]-INTERV RAD  . IR GENERIC HISTORICAL Left 07/16/2016   IR THROMBECTOMY AV FISTULA W/THROMBOLYSIS/PTA INC/SHUNT/IMG LEFT 07/16/2016 Corrie Mckusick, DO [facility]-INTER[facility]  . KIDNEY RECEIPIENT  2006   failed and started HD in March 2014  . LEFT HEART CATHETERIZATION WITH CORONARY ANGIOGRAM N/A 09/02/2014   Procedure: LEFT HEART CATHETERIZATION WITH CORONARY ANGIOGRAM;  Surgeon: [name], MD;  Location: [facility] C[facility] LAB;  Service: Cardiovascular;  Laterality: N/A;       Home Medications    Prior to Admission medications   Medication Sig Start Date End Date Taking? Authorizing Provider  amLODipine (NORVASC) 10 MG tablet Take 1 tablet (10 mg total) by mouth at bedtime. 12/01/16  Yes Rice, Resa Miner, MD  cloNIDine (CATAPRES - DOSED IN MG/24 HR) 0.3 mg/24hr patch Place onto the skin once a week. On Wednesday 12/31/16  Yes [provider]  hydrALAZINE (APRESOLINE) 100 MG tablet Take 1 tablet (100 mg total) by mouth 2 (two) times daily. 01/07/17  Yes Ledell Noss, MD  isosorbide mononitrate (IMDUR) 60 MG 24 hr tablet Take 1 tablet (60 mg total) by mouth daily. 01/08/17  Yes Ledell Noss, MD  metoCLOPramide (REGLAN) 5 MG tablet Take 1 tablet (5 mg total) by mouth every 8 (eight) hours as needed for nausea. 12/01/16  Yes Rice, Resa Miner, MD  pantoprazole (PROTONIX) 40 MG tablet Take 1 tablet (40 mg total) by mouth 2 (two) times daily before a meal. 12/01/16  Yes Rice, Resa Miner, MD  sevelamer carbonate (RENVELA) 800 MG tablet Take 1 tablet (800 mg total) by mouth 3 (three) times daily with meals. Patient taking differently: Take 800 mg by mouth 3 (three) times daily with meals. 800 mg as needed with snack 01/06/17  Yes Ledell Noss, MD  traMADol (ULTRAM) 50 MG tablet Take 1 tablet (50 mg total) by mouth every 12 (twelve) hours  as needed for moderate pain or severe pain. 12/01/16  Yes Rice, Resa Miner, MD    Family History Family History  Problem Relation Age of Onset  . Hypertension Other     Social History Social History  Substance Use Topics  . Smoking status: Former Smoker    Packs/day: 0.00    Years: 1.00    Types: Cigarettes  . Smokeless tobacco: Never Used     Comment: quit Jan 2014  . Alcohol use No     Allergies   Butalbital-apap-caffeine; Ferrlecit [na ferric gluc cplx in sucrose]; Minoxidil; Tylenol [acetaminophen]; and Darvocet [propoxyphene n-acetaminophen]   Review of Systems Review of Systems  Constitutional: Negative for fever.  Respiratory: Negative for shortness of breath.   Cardiovascular: Negative for chest pain.  Musculoskeletal:       Right arm  pain  Skin: Negative for color change and wound.  All other systems reviewed and are negative.    Physical Exam Updated Vital Signs BP (!) 194/108 (BP Location: Left Leg)   Pulse 83   Temp 98.1 F (36.7 C) (Oral)   Resp (!) 21   Ht 6' 2" (1.88 m)   Wt 73.5 kg (162 lb)   SpO2 95%   BMI 20.80 kg/m   Physical Exam  Constitutional: He is oriented to person, place, and time. No distress.  Chronically ill-appearing, no acute distress  HENT:  Head: Normocephalic and atraumatic.  Cardiovascular: Normal rate, regular rhythm and normal heart sounds.   No murmur heard. Pulmonary/Chest: Effort normal and breath sounds normal. No respiratory distress. He has no wheezes.  Abdominal: Soft. There is no tenderness.  Musculoskeletal: He exhibits no edema.  Fistula left forearm with positive thrill Right forearm without significant swelling, there is tenderness to palpation along the anterior musculature, no tension noted, no overlying skin changes, 2+ radial pulse, neurovascularly intact distally  Neurological: He is alert and oriented to person, place, and time.  Skin: Skin is warm and dry.  Psychiatric: He has a normal mood and  affect.  Nursing note and vitals reviewed.    ED Treatments / Results  Labs (all labs ordered are listed, but only abnormal results are displayed) Labs Reviewed - No data to display  EKG  EKG Interpretation None       Radiology Dg Forearm Right  Result Date: 03/10/2017 CLINICAL DATA:  Right forearm pain, swelling, redness, or bruising. EXAM: RIGHT FOREARM - 2 VIEW COMPARISON:  None. FINDINGS: There is no evidence of fracture or other focal bone lesions. Wrist and elbow alignment is maintained. Moderate olecranon spur. Dense vascular calcifications. Soft tissue edema noted about the volar forearm. No soft tissue air or radiopaque foreign body. IMPRESSION: Soft tissue edema.  No acute osseous abnormality. Dense vascular calcifications. Electronically Signed   By: Jeb Levering M.D.   On: 03/10/2017 04:23    Procedures Procedures (including critical care time)  Medications Ordered in ED Medications  traMADol (ULTRAM) tablet 50 mg (not administered)     Initial Impression / Assessment and Plan / ED Course  I have reviewed the triage vital signs and the nursing notes.  Pertinent labs & imaging results that were available during my care of the patient were reviewed by me and considered in my medical decision making (see chart for details).     Patient presents with concerns for right arm pain and swelling. No appreciable swelling. There is tenderness to palpation. No signs of cellulitis at this time. Patient has not gotten his ultrasound. Patient was given tramadol. Will hold for ultrasound later today given patient is unreliable. Repeat lab work was not ordered. Anticipate patient to be able to return to dialysis as scheduled.  Final Clinical Impressions(s) / ED Diagnoses   Final diagnoses:  Swelling in right armpit    New Prescriptions New Prescriptions   No medications on file     Merryl Hacker, MD 03/11/17 [phone]

## 2017-03-11 NOTE — ED Notes (Signed)
Pt asked for food, Dr. Zenia Resides stated that was fine so tech gave pt raviolli, sandwhich and apple juice

## 2017-03-11 NOTE — Progress Notes (Addendum)
VASCULAR LAB PRELIMINARY  PRELIMINARY  PRELIMINARY  PRELIMINARY Right lower extremity venous duplex completed.    Preliminary report: Right -  Positive for an acute DVT of the distal axillary. Small chronic DVT of the brachial, distal radial and distal ulnar  Also noted is an acute superficial thrombus noted in the basilic.  Hendrick Pavich, RVS 03/11/2017, 10:30 AM

## 2017-03-11 NOTE — Discharge Instructions (Signed)
You are leaving here [facility] for treatment of your right upper extremity blood clot. You understand that you could develop a blood clot in your lung and die. I strongly encourage you to go to [facility] for treatment if you change your mind

## 2017-03-11 NOTE — ED Notes (Signed)
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  He said "if I'm leaving AMA, why am I waiting around here"  He understands that his BP is elevated and he has blood clots in his are and said he's been getting medical care for 20 years and has got to live his life and do what he's got to do.

## 2017-03-11 NOTE — ED Triage Notes (Signed)
Pt from home with c/o right forearm pain. Pt was seen recently seen at [facility] for same. Pt has minimal swelling. Area is not hot to the touch. Pt denies recent blood work or IV in affected extremity. Pt is dialysis patient and has a dialysis shunt in left upper arm. Pt is hypertensive at time of assessment

## 2017-03-11 NOTE — ED Provider Notes (Signed)
Patient signed out to me by Dr. Dina Rich. Ultrasound shows DVT in the right upper extremity. Discussed the case with Dr. Burnett Sheng from nephrology who recommends patient be admitted for treatment of this. Patient does not want a hospital at this time and states that he will go to dialysis and follow-up there. I explained to the patient the risk of death and permanent disability from this. He understands this and appears have capacity to make this decision. Have strongly encouraged him to return if he changes his mind.   Lacretia Leigh, MD 03/11/17 1144

## 2017-03-12 ENCOUNTER — Encounter (HOSPITAL_COMMUNITY): Payer: Self-pay

## 2017-03-12 DIAGNOSIS — Z87891 Personal history of nicotine dependence: Secondary | ICD-10-CM

## 2017-03-12 DIAGNOSIS — I429 Cardiomyopathy, unspecified: Secondary | ICD-10-CM | POA: Diagnosis present

## 2017-03-12 DIAGNOSIS — E875 Hyperkalemia: Secondary | ICD-10-CM | POA: Diagnosis present

## 2017-03-12 DIAGNOSIS — N2581 Secondary hyperparathyroidism of renal origin: Secondary | ICD-10-CM | POA: Diagnosis present

## 2017-03-12 DIAGNOSIS — I82621 Acute embolism and thrombosis of deep veins of right upper extremity: Secondary | ICD-10-CM | POA: Diagnosis not present

## 2017-03-12 DIAGNOSIS — Z992 Dependence on renal dialysis: Secondary | ICD-10-CM

## 2017-03-12 DIAGNOSIS — E1122 Type 2 diabetes mellitus with diabetic chronic kidney disease: Secondary | ICD-10-CM | POA: Diagnosis present

## 2017-03-12 DIAGNOSIS — I160 Hypertensive urgency: Secondary | ICD-10-CM | POA: Diagnosis present

## 2017-03-12 DIAGNOSIS — I5042 Chronic combined systolic (congestive) and diastolic (congestive) heart failure: Secondary | ICD-10-CM | POA: Diagnosis present

## 2017-03-12 DIAGNOSIS — D631 Anemia in chronic kidney disease: Secondary | ICD-10-CM | POA: Diagnosis present

## 2017-03-12 DIAGNOSIS — Z86718 Personal history of other venous thrombosis and embolism: Secondary | ICD-10-CM

## 2017-03-12 DIAGNOSIS — N186 End stage renal disease: Secondary | ICD-10-CM | POA: Diagnosis not present

## 2017-03-12 DIAGNOSIS — E8889 Other specified metabolic disorders: Secondary | ICD-10-CM | POA: Diagnosis present

## 2017-03-12 DIAGNOSIS — I120 Hypertensive chronic kidney disease with stage 5 chronic kidney disease or end stage renal disease: Secondary | ICD-10-CM | POA: Diagnosis not present

## 2017-03-12 DIAGNOSIS — Z8249 Family history of ischemic heart disease and other diseases of the circulatory system: Secondary | ICD-10-CM

## 2017-03-12 DIAGNOSIS — I132 Hypertensive heart and chronic kidney disease with heart failure and with stage 5 chronic kidney disease, or end stage renal disease: Secondary | ICD-10-CM | POA: Diagnosis present

## 2017-03-12 DIAGNOSIS — K219 Gastro-esophageal reflux disease without esophagitis: Secondary | ICD-10-CM | POA: Diagnosis present

## 2017-03-12 NOTE — ED Triage Notes (Signed)
PT was called by PCP today and told he had blood clot in right arm and needed to be seen here

## 2017-03-12 NOTE — ED Notes (Signed)
Pt refused vitals in the lobby.

## 2017-03-13 ENCOUNTER — Inpatient Hospital Stay (HOSPITAL_COMMUNITY)
Admission: EM | Admit: 2017-03-13 | Discharge: 2017-03-15 | DRG: 299 | Discharge status: Against Medical Advice | Payer: Medicare Other | Attending: Internal Medicine | Admitting: Internal Medicine

## 2017-03-13 ENCOUNTER — Encounter (HOSPITAL_COMMUNITY): Payer: Self-pay | Admitting: General Practice

## 2017-03-13 DIAGNOSIS — N186 End stage renal disease: Secondary | ICD-10-CM

## 2017-03-13 DIAGNOSIS — N189 Chronic kidney disease, unspecified: Secondary | ICD-10-CM | POA: Diagnosis not present

## 2017-03-13 DIAGNOSIS — Z87891 Personal history of nicotine dependence: Secondary | ICD-10-CM | POA: Diagnosis not present

## 2017-03-13 DIAGNOSIS — D631 Anemia in chronic kidney disease: Secondary | ICD-10-CM | POA: Diagnosis not present

## 2017-03-13 DIAGNOSIS — I120 Hypertensive chronic kidney disease with stage 5 chronic kidney disease or end stage renal disease: Secondary | ICD-10-CM | POA: Diagnosis not present

## 2017-03-13 DIAGNOSIS — I82621 Acute embolism and thrombosis of deep veins of right upper extremity: Secondary | ICD-10-CM | POA: Diagnosis not present

## 2017-03-13 DIAGNOSIS — I160 Hypertensive urgency: Secondary | ICD-10-CM | POA: Diagnosis not present

## 2017-03-13 DIAGNOSIS — I5042 Chronic combined systolic (congestive) and diastolic (congestive) heart failure: Secondary | ICD-10-CM | POA: Diagnosis present

## 2017-03-13 DIAGNOSIS — I10 Essential (primary) hypertension: Secondary | ICD-10-CM | POA: Diagnosis present

## 2017-03-13 DIAGNOSIS — E875 Hyperkalemia: Secondary | ICD-10-CM

## 2017-03-13 DIAGNOSIS — Z992 Dependence on renal dialysis: Secondary | ICD-10-CM | POA: Diagnosis not present

## 2017-03-13 DIAGNOSIS — E8889 Other specified metabolic disorders: Secondary | ICD-10-CM | POA: Diagnosis present

## 2017-03-13 DIAGNOSIS — I132 Hypertensive heart and chronic kidney disease with heart failure and with stage 5 chronic kidney disease, or end stage renal disease: Secondary | ICD-10-CM | POA: Diagnosis present

## 2017-03-13 DIAGNOSIS — I82A11 Acute embolism and thrombosis of right axillary vein: Secondary | ICD-10-CM | POA: Diagnosis not present

## 2017-03-13 DIAGNOSIS — I429 Cardiomyopathy, unspecified: Secondary | ICD-10-CM | POA: Diagnosis present

## 2017-03-13 DIAGNOSIS — N2581 Secondary hyperparathyroidism of renal origin: Secondary | ICD-10-CM | POA: Diagnosis not present

## 2017-03-13 DIAGNOSIS — Z86718 Personal history of other venous thrombosis and embolism: Secondary | ICD-10-CM

## 2017-03-13 DIAGNOSIS — I82409 Acute embolism and thrombosis of unspecified deep veins of unspecified lower extremity: Secondary | ICD-10-CM | POA: Diagnosis present

## 2017-03-13 DIAGNOSIS — E1122 Type 2 diabetes mellitus with diabetic chronic kidney disease: Secondary | ICD-10-CM | POA: Diagnosis present

## 2017-03-13 DIAGNOSIS — K219 Gastro-esophageal reflux disease without esophagitis: Secondary | ICD-10-CM | POA: Diagnosis present

## 2017-03-13 DIAGNOSIS — N185 Chronic kidney disease, stage 5: Secondary | ICD-10-CM

## 2017-03-13 DIAGNOSIS — Z8249 Family history of ischemic heart disease and other diseases of the circulatory system: Secondary | ICD-10-CM | POA: Diagnosis not present

## 2017-03-13 LAB — RENAL FUNCTION PANEL
Albumin: 3 g/dL — ABNORMAL LOW (ref 3.5–5.0)
Anion gap: 15 (ref 5–15)
BUN: 83 mg/dL — ABNORMAL HIGH (ref 6–20)
CO2: 24 mmol/L (ref 22–32)
Calcium: 7.9 mg/dL — ABNORMAL LOW (ref 8.9–10.3)
Chloride: 99 mmol/L — ABNORMAL LOW (ref 101–111)
Creatinine, Ser: 17.11 mg/dL — ABNORMAL HIGH (ref 0.61–1.24)
GFR calc Af Amer: 3 mL/min — ABNORMAL LOW (ref >60)
GFR calc non Af Amer: 3 mL/min — ABNORMAL LOW (ref >60)
Glucose, Bld: 92 mg/dL (ref 65–99)
Phosphorus: 6 mg/dL — ABNORMAL HIGH (ref 2.5–4.6)
Potassium: 6 mmol/L — ABNORMAL HIGH (ref 3.5–5.1)
Sodium: 138 mmol/L (ref 135–145)

## 2017-03-13 LAB — BASIC METABOLIC PANEL
ANION GAP: 17 — AB (ref 5–15)
BUN: 78 mg/dL — ABNORMAL HIGH (ref 6–20)
CALCIUM: 7.8 mg/dL — AB (ref 8.9–10.3)
CO2: 24 mmol/L (ref 22–32)
Chloride: 99 mmol/L — ABNORMAL LOW (ref 101–111)
Creatinine, Ser: 16.08 mg/dL — ABNORMAL HIGH (ref 0.61–1.24)
GFR, EST AFRICAN AMERICAN: 3 mL/min — AB (ref >60)
GFR, EST NON AFRICAN AMERICAN: 3 mL/min — AB (ref >60)
GLUCOSE: 77 mg/dL (ref 65–99)
POTASSIUM: 5.7 mmol/L — AB (ref 3.5–5.1)
SODIUM: 140 mmol/L (ref 135–145)

## 2017-03-13 LAB — TYPE AND SCREEN
ABO/RH(D): B POS
Antibody Screen: NEGATIVE

## 2017-03-13 LAB — I-STAT CHEM 8, ED
BUN: 74 mg/dL — ABNORMAL HIGH (ref 6–20)
CALCIUM ION: 0.83 mmol/L — AB (ref 1.15–1.40)
Chloride: 101 mmol/L (ref 101–111)
Creatinine, Ser: 17.2 mg/dL — ABNORMAL HIGH (ref 0.61–1.24)
GLUCOSE: 85 mg/dL (ref 65–99)
HCT: 23 % — ABNORMAL LOW (ref 39.0–52.0)
HEMOGLOBIN: 7.8 g/dL — AB (ref 13.0–17.0)
Potassium: 6.1 mmol/L — ABNORMAL HIGH (ref 3.5–5.1)
SODIUM: 138 mmol/L (ref 135–145)
TCO2: 28 mmol/L (ref 0–100)

## 2017-03-13 LAB — CBC WITH DIFFERENTIAL/PLATELET
BASOS ABS: 0 10*3/uL (ref 0.0–0.1)
BASOS PCT: 0 %
EOS ABS: 0.4 10*3/uL (ref 0.0–0.7)
EOS PCT: 6 %
HCT: 14.7 % — ABNORMAL LOW (ref 39.0–52.0)
Hemoglobin: 4.8 g/dL — CL (ref 13.0–17.0)
Lymphocytes Relative: 18 %
Lymphs Abs: 1.2 10*3/uL (ref 0.7–4.0)
MCH: 27.6 pg (ref 26.0–34.0)
MCHC: 32.7 g/dL (ref 30.0–36.0)
MCV: 84.5 fL (ref 78.0–100.0)
MONO ABS: 0.6 10*3/uL (ref 0.1–1.0)
Monocytes Relative: 8 %
Neutro Abs: 4.9 10*3/uL (ref 1.7–7.7)
Neutrophils Relative %: 69 %
PLATELETS: 261 10*3/uL (ref 150–400)
RBC: 1.74 MIL/uL — AB (ref 4.22–5.81)
RDW: 19.5 % — AB (ref 11.5–15.5)
WBC: 7.1 10*3/uL (ref 4.0–10.5)

## 2017-03-13 LAB — PROTIME-INR
INR: 1.24
INR: 1.2
PROTHROMBIN TIME: 15.7 s — AB (ref 11.4–15.2)
Prothrombin Time: 15.2 seconds (ref 11.4–15.2)

## 2017-03-13 LAB — CBC
HCT: 26.8 % — ABNORMAL LOW (ref 39.0–52.0)
Hemoglobin: 8.3 g/dL — ABNORMAL LOW (ref 13.0–17.0)
MCH: 26.2 pg (ref 26.0–34.0)
MCHC: 31 g/dL (ref 30.0–36.0)
MCV: 84.5 fL (ref 78.0–100.0)
Platelets: 246 10*3/uL (ref 150–400)
RBC: 3.17 MIL/uL — ABNORMAL LOW (ref 4.22–5.81)
RDW: 18.9 % — ABNORMAL HIGH (ref 11.5–15.5)
WBC: 5.2 10*3/uL (ref 4.0–10.5)

## 2017-03-13 LAB — HEPARIN LEVEL (UNFRACTIONATED): HEPARIN UNFRACTIONATED: 0.12 [IU]/mL — AB (ref 0.30–0.70)

## 2017-03-13 LAB — APTT: APTT: 39 s — AB (ref 24–36)

## 2017-03-13 LAB — POC OCCULT BLOOD, ED: Fecal Occult Bld: NEGATIVE

## 2017-03-13 MED ORDER — CALCITRIOL 0.5 MCG PO CAPS: 3.5000 ug | ORAL_CAPSULE | ORAL | Status: DC

## 2017-03-13 MED ORDER — PANTOPRAZOLE SODIUM 40 MG PO TBEC
40.0000 mg | DELAYED_RELEASE_TABLET | Freq: Two times a day (BID) | ORAL | Status: DC
  Administered 2017-03-13 – 2017-03-15 (×4): 40 mg via ORAL
  Filled 2017-03-13 (×4): qty 1

## 2017-03-13 MED ORDER — SEVELAMER CARBONATE 800 MG PO TABS: 800.0000 mg | ORAL_TABLET | Freq: Three times a day (TID) | ORAL | Status: DC

## 2017-03-13 MED ORDER — SODIUM CHLORIDE 0.9 % IV SOLN: 100.0000 mL | INTRAVENOUS | Status: DC | PRN

## 2017-03-13 MED ORDER — OXYCODONE HCL 5 MG PO TABS: 5.0000 mg | ORAL_TABLET | ORAL | Status: DC | PRN

## 2017-03-13 MED ORDER — ONDANSETRON HCL 4 MG PO TABS: 4.0000 mg | ORAL_TABLET | Freq: Four times a day (QID) | ORAL | Status: DC | PRN

## 2017-03-13 MED ORDER — HEPARIN (PORCINE) IN NACL 100-0.45 UNIT/ML-% IJ SOLN
1300.0000 [IU]/h | INTRAMUSCULAR | Status: DC
  Administered 2017-03-13: 1300 [IU]/h via INTRAVENOUS
  Filled 2017-03-13 (×2): qty 250

## 2017-03-13 MED ORDER — SEVELAMER CARBONATE 800 MG PO TABS
2400.0000 mg | ORAL_TABLET | Freq: Three times a day (TID) | ORAL | Status: DC
  Administered 2017-03-13 – 2017-03-15 (×5): 2400 mg via ORAL
  Filled 2017-03-13 (×5): qty 3

## 2017-03-13 MED ORDER — TRAMADOL HCL 50 MG PO TABS: 50.0000 mg | ORAL_TABLET | Freq: Two times a day (BID) | ORAL | Status: DC | PRN

## 2017-03-13 MED ORDER — OXYCODONE HCL 5 MG PO TABS
5.0000 mg | ORAL_TABLET | ORAL | Status: DC | PRN
  Administered 2017-03-13 – 2017-03-14 (×5): 5 mg via ORAL
  Filled 2017-03-13 (×3): qty 1

## 2017-03-13 MED ORDER — ONDANSETRON HCL 4 MG/2ML IJ SOLN: 4.0000 mg | Freq: Four times a day (QID) | INTRAMUSCULAR | Status: DC | PRN

## 2017-03-13 MED ORDER — PENTAFLUOROPROP-TETRAFLUOROETH EX AERO: 1.0000 "application " | INHALATION_SPRAY | CUTANEOUS | Status: DC | PRN

## 2017-03-13 MED ORDER — HEPARIN (PORCINE) IN NACL 100-0.45 UNIT/ML-% IJ SOLN
1800.0000 [IU]/h | INTRAMUSCULAR | Status: DC
  Administered 2017-03-13: 1550 [IU]/h via INTRAVENOUS
  Filled 2017-03-13 (×3): qty 250

## 2017-03-13 MED ORDER — METOCLOPRAMIDE HCL 5 MG PO TABS: 5.0000 mg | ORAL_TABLET | Freq: Three times a day (TID) | ORAL | Status: DC | PRN

## 2017-03-13 MED ORDER — OXYCODONE HCL 5 MG PO TABS
ORAL_TABLET | ORAL | Status: AC
  Administered 2017-03-13: 5 mg via ORAL
  Filled 2017-03-13: qty 1

## 2017-03-13 MED ORDER — ISOSORBIDE MONONITRATE ER 60 MG PO TB24
60.0000 mg | ORAL_TABLET | Freq: Every day | ORAL | Status: DC
  Administered 2017-03-14: 60 mg via ORAL
  Filled 2017-03-13: qty 1

## 2017-03-13 MED ORDER — ONDANSETRON HCL 4 MG/2ML IJ SOLN
4.0000 mg | Freq: Once | INTRAMUSCULAR | Status: AC
  Administered 2017-03-13: 4 mg via INTRAVENOUS
  Filled 2017-03-13: qty 2

## 2017-03-13 MED ORDER — HEPARIN BOLUS VIA INFUSION
5000.0000 [IU] | Freq: Once | INTRAVENOUS | Status: DC
  Filled 2017-03-13: qty 5000

## 2017-03-13 MED ORDER — METOCLOPRAMIDE HCL 5 MG/ML IJ SOLN
5.0000 mg | Freq: Three times a day (TID) | INTRAMUSCULAR | Status: DC
  Administered 2017-03-13: 5 mg via INTRAVENOUS
  Filled 2017-03-13: qty 2

## 2017-03-13 MED ORDER — HYDRALAZINE HCL 20 MG/ML IJ SOLN
INTRAMUSCULAR | Status: AC
  Administered 2017-03-13: 20 mg via INTRAVENOUS
  Filled 2017-03-13: qty 1

## 2017-03-13 MED ORDER — WARFARIN - PHARMACIST DOSING INPATIENT
Freq: Every day | Status: DC
  Administered 2017-03-14: 20:00:00

## 2017-03-13 MED ORDER — METOCLOPRAMIDE HCL 5 MG/ML IJ SOLN: 5.0000 mg | Freq: Three times a day (TID) | INTRAMUSCULAR | Status: DC

## 2017-03-13 MED ORDER — SEVELAMER CARBONATE 800 MG PO TABS: 800.0000 mg | ORAL_TABLET | ORAL | Status: DC | PRN

## 2017-03-13 MED ORDER — AMLODIPINE BESYLATE 10 MG PO TABS
10.0000 mg | ORAL_TABLET | Freq: Every day | ORAL | Status: DC
  Administered 2017-03-13 – 2017-03-14 (×3): 10 mg via ORAL
  Filled 2017-03-13: qty 2
  Filled 2017-03-13 (×2): qty 1

## 2017-03-13 MED ORDER — HYDRALAZINE HCL 20 MG/ML IJ SOLN
20.0000 mg | Freq: Once | INTRAMUSCULAR | Status: AC
  Administered 2017-03-13: 20 mg via INTRAVENOUS
  Filled 2017-03-13: qty 1

## 2017-03-13 MED ORDER — CALCITRIOL 0.5 MCG PO CAPS
3.5000 ug | ORAL_CAPSULE | Freq: Once | ORAL | Status: AC
  Administered 2017-03-13: 3.5 ug via ORAL
  Filled 2017-03-13: qty 7

## 2017-03-13 MED ORDER — WARFARIN SODIUM 10 MG PO TABS
10.0000 mg | ORAL_TABLET | Freq: Once | ORAL | Status: DC
  Filled 2017-03-13: qty 1

## 2017-03-13 MED ORDER — MORPHINE SULFATE (PF) 4 MG/ML IV SOLN
4.0000 mg | Freq: Once | INTRAVENOUS | Status: AC
  Administered 2017-03-13: 4 mg via INTRAVENOUS
  Filled 2017-03-13: qty 1

## 2017-03-13 MED ORDER — METOCLOPRAMIDE HCL 10 MG PO TABS
5.0000 mg | ORAL_TABLET | Freq: Three times a day (TID) | ORAL | Status: DC | PRN
  Filled 2017-03-13: qty 1

## 2017-03-13 MED ORDER — HYDRALAZINE HCL 100 MG PO TABS: 100.0000 mg | ORAL_TABLET | Freq: Two times a day (BID) | ORAL | Status: DC

## 2017-03-13 MED ORDER — LIDOCAINE HCL (PF) 1 % IJ SOLN: 5.0000 mL | INTRAMUSCULAR | Status: DC | PRN

## 2017-03-13 MED ORDER — WARFARIN SODIUM 10 MG PO TABS
10.0000 mg | ORAL_TABLET | Freq: Once | ORAL | Status: AC
  Administered 2017-03-13: 10 mg via ORAL
  Filled 2017-03-13: qty 1

## 2017-03-13 MED ORDER — HYDRALAZINE HCL 20 MG/ML IJ SOLN
10.0000 mg | INTRAMUSCULAR | Status: DC | PRN
  Administered 2017-03-13: 20 mg via INTRAVENOUS

## 2017-03-13 MED ORDER — LIDOCAINE-PRILOCAINE 2.5-2.5 % EX CREA: 1.0000 "application " | TOPICAL_CREAM | CUTANEOUS | Status: DC | PRN

## 2017-03-13 MED ORDER — CLONIDINE HCL 0.3 MG/24HR TD PTWK
0.3000 mg | MEDICATED_PATCH | TRANSDERMAL | Status: DC
  Administered 2017-03-13: 0.3 mg via TRANSDERMAL
  Filled 2017-03-13: qty 1

## 2017-03-13 MED ORDER — HYDRALAZINE HCL 50 MG PO TABS
100.0000 mg | ORAL_TABLET | Freq: Two times a day (BID) | ORAL | Status: DC
  Administered 2017-03-13 – 2017-03-14 (×3): 100 mg via ORAL
  Filled 2017-03-13 (×3): qty 2

## 2017-03-13 MED ORDER — CINACALCET HCL 30 MG PO TABS
90.0000 mg | ORAL_TABLET | Freq: Every day | ORAL | Status: DC
  Administered 2017-03-13 – 2017-03-14 (×2): 90 mg via ORAL
  Filled 2017-03-13 (×2): qty 3

## 2017-03-13 NOTE — H&P (Signed)
Patient unwilling to finish history at this time

## 2017-03-13 NOTE — ED Notes (Signed)
ED Provider at bedside. 

## 2017-03-13 NOTE — Procedures (Signed)
I was present at this dialysis session, have reviewed the session itself and made  appropriate changes Kelly Splinter MD [employer] pager [phone]   03/13/2017, 5:26 PM

## 2017-03-13 NOTE — ED Notes (Addendum)
Hemoglobin 4.8; EDP made aware. Holding heparin at this time.

## 2017-03-13 NOTE — Progress Notes (Addendum)
ANTICOAGULATION CONSULT NOTE - Initial Consult  Pharmacy Consult for Heparin Indication: RUE DVT (and history of DVT and PE)  Allergies  Allergen Reactions  . Butalbital-Apap-Caffeine Shortness Of Breath, Swelling and Other (See Comments)    Swelling in throat  . Ferrlecit [Na Ferric Gluc Cplx In Sucrose] Shortness Of Breath, Swelling and Other (See Comments)    Swelling in throat, tolerates Venofor  . Minoxidil Shortness Of Breath  . Tylenol [Acetaminophen] Anaphylaxis and Swelling  . Darvocet [Propoxyphene N-Acetaminophen] Hives    Patient Measurements: Weight: 162 lb (73.5 kg) Heparin Dosing Weight: 73.5 kg  Vital Signs: Temp: 97.7 F (36.5 C) (08/22 2056) Temp Source: Oral (08/22 2056) BP: 170/108 (08/23 0054) Pulse Rate: 79 (08/22 2056)  Labs:  Recent Labs  03/10/17 0438  HGB 8.1*  HCT 26.1*  PLT 211  APTT 46*  LABPROT 15.0  INR 1.17  CREATININE 10.70*    Estimated Creatinine Clearance: 8.4 mL/min (A) (by C-G formula based on SCr of 10.7 mg/dL (H)).   Medical History: Past Medical History:  Diagnosis Date  . Anemia   . Anxiety   . Chronic combined systolic and diastolic CHF (congestive heart failure) (HCC)    a. EF 20-25% by echo in 08/2015 b. echo 10/2015: EF 35-40%, diffuse HK, severe LAE, moderate RAE, small pericardial effusion  . Complication of anesthesia    itching, sore throat  . Depression   . Dialysis patient ([place])   . ESRD (end stage renal disease) ([place])    due to HTN per patient, followed at [facility], s/p failed kidney transplant - dialysis Tue, Th, Sat  . Hyperkalemia 12/2015  . Hypertension   . Junctional rhythm    a. noted in 08/2015: hyperkalemic at that time  b. 12/2015: presented in junctional rhythm w/ K+ of 6.6. Resolved with improvement of K+ levels.  . Nonischemic cardiomyopathy ([place])    a. 08/2014: cath showing minimal CAD, but tortuous arteries noted.   . Personal history of DVT (deep vein thrombosis)/ PE 05/26/2016   In Oct  2015 had small subsemental LUL PE w/o DVT (LE dopplers neg) and was felt to be HD cath related, treated w coumadin.  IN May 2016 had small vein DVT (acute/subacute) in the R basilic/ brachial veins of the RUE, resumed on coumadin.  Had R sided HD cath at that time.    . Renal insufficiency   . Shortness of breath   . Type II diabetes mellitus (HCC)    No history per patient, but remains under history as A1c would not be accurate given on dialysis   Assessment: 51 year old male with ESRD and RUE DVT diagnosed on 8/20 but refused admission and treatment now returning for treatment to start IV Heparin. Patient has history of DVT and PE in 2015 and 2016 treated with Coumadin. Patient is currently not on Coumadin and INR is 1.17. Hgb is low at 8.1 and platelets are 211. No bleeding noted.   Goal of Therapy:  Heparin level 0.3-0.7 units/ml Monitor platelets by anticoagulation protocol: Yes   Plan:  Give 5000 units bolus x 1 Start heparin infusion at 1300 units/hr Check anti-Xa level in 6 hours and daily while on heparin Continue to monitor H&H and platelets  Follow up plan for long-term anticoagulation  Sloan Leiter, PharmD, BCPS Clinical Pharmacist After hours, please call [phone] 03/13/2017,1:01 AM   Addendum: Adding Coumadin tonight.  Note Hgb of 4.8 was in error. Repeat is stable at 7.8.  RN is now  initiating Heparin.  Repeat INR 1.24. APTT 39.   Plan: Start Coumadin 37m po x1 now.  Daily PT/INR.   JSloan Leiter PharmD, BCPS Clinical Pharmacist After hours, please call #[phone]

## 2017-03-13 NOTE — ED Provider Notes (Addendum)
[facility] DEPT Provider Note   CSN: [id number] Arrival date & time: 03/12/17  2051     History   Chief Complaint Chief Complaint  Patient presents with  . DVT    HPI Frank [name] is a 51 y.o. male.  Patient presents for treatment of DVT right upper extremity. Patient diagnosed with DVT of right arm 2 days ago in the ER. He declined admission at that time, thought he would be able to follow-up at dialysis and initiate treatment. He has not had anticoagulation started yet. He comes tonight reporting that he is prepared for admission. He complains of persistent pain in the right arm, no chest pain, shortness of breath.      Past Medical History:  Diagnosis Date  . Anemia   . Anxiety   . Chronic combined systolic and diastolic CHF (congestive heart failure) (HCC)    a. EF 20-25% by echo in 08/2015 b. echo 10/2015: EF 35-40%, diffuse HK, severe LAE, moderate RAE, small pericardial effusion  . Complication of anesthesia    itching, sore throat  . Depression   . Dialysis patient ([place])   . ESRD (end stage renal disease) ([place])    due to HTN per patient, followed at [facility], s/p failed kidney transplant - dialysis Tue, Th, Sat  . Hyperkalemia 12/2015  . Hypertension   . Junctional rhythm    a. noted in 08/2015: hyperkalemic at that time  b. 12/2015: presented in junctional rhythm w/ K+ of 6.6. Resolved with improvement of K+ levels.  . Nonischemic cardiomyopathy ([place])    a. 08/2014: cath showing minimal CAD, but tortuous arteries noted.   . Personal history of DVT (deep vein thrombosis)/ PE 05/26/2016   In Oct 2015 had small subsemental LUL PE w/o DVT (LE dopplers neg) and was felt to be HD cath related, treated w coumadin.  IN May 2016 had small vein DVT (acute/subacute) in the R basilic/ brachial veins of the RUE, resumed on coumadin.  Had R sided HD cath at that time.    . Renal insufficiency   . Shortness of breath   . Type II diabetes mellitus (HCC)    No history per  patient, but remains under history as A1c would not be accurate given on dialysis    Patient Active Problem List   Diagnosis Date Noted  . DVT (deep venous thrombosis) ([place]) 03/11/2017  . Anemia 02/24/2017  . Volume overload 01/13/2017  . Aortic atherosclerosis ([place]) 01/05/2017  . Abdominal pain 08/04/2016  . Uremia 06/07/2016  . Hyperkalemia 05/29/2016  . GERD (gastroesophageal reflux disease) 05/29/2016  . Personal history of DVT (deep vein thrombosis)/ PE 05/26/2016  . Nonischemic cardiomyopathy ([place]) 01/09/2016  . Bilateral low back pain without sciatica   . Renal cyst, left 10/30/2015  . Constipation by delayed colonic transit 10/30/2015  . Acute on chronic combined systolic and diastolic congestive heart failure ([place]) 09/23/2015  . Chest pain 09/08/2015  . Adjustment disorder with mixed anxiety and depressed mood 08/20/2015  . Essential hypertension 01/02/2015  . Dyslipidemia   . Malignant hypertension 11/29/2014  . ESRD on hemodialysis ([place])   . Acute pulmonary edema (HCC)   . DM (diabetes mellitus), type 2, uncontrolled, with renal complications ([place])   . Complex sleep apnea syndrome 05/05/2014  . Anemia of chronic kidney failure 06/24/2013  . Nausea and vomiting 06/24/2013    Past Surgical History:  Procedure Laterality Date  . CAPD INSERTION    . CAPD REMOVAL    .  INGUINAL HERNIA REPAIR Right 02/14/2015   Procedure: REPAIR INCARCERATED RIGHT INGUINAL HERNIA;  Surgeon: [name], MD;  Location: [facility];  Service: General;  Laterality: Right;  . INSERTION OF DIALYSIS CATHETER Right 09/23/2015   Procedure: exchange of Right internal Dialysis Catheter.;  Surgeon: [name], MD;  Location: [facility];  Service: Vascular;  Laterality: Right;  . IR GENERIC HISTORICAL  07/16/2016   IR US GUIDE VASC ACCESS LEFT 07/16/2016 Corrie Mckusick, DO [facility]-INTERV RAD  . IR GENERIC HISTORICAL Left 07/16/2016   IR THROMBECTOMY AV FISTULA W/THROMBOLYSIS/PTA INC/SHUNT/IMG LEFT 07/16/2016 Corrie Mckusick, DO [facility]-INTERV RAD  . KIDNEY RECEIPIENT  2006   failed and started HD in March 2014  . LEFT HEART CATHETERIZATION WITH CORONARY ANGIOGRAM N/A 09/02/2014   Procedure: LEFT HEART CATHETERIZATION WITH CORONARY ANGIOGRAM;  Surgeon: [name], MD;  Location: [facility] CATH LAB;  Service: Cardiovascular;  Laterality: N/A;       Home Medications    Prior to Admission medications   Medication Sig Start Date End Date Taking? Authorizing Provider  amLODipine (NORVASC) 10 MG tablet Take 1 tablet (10 mg total) by mouth at bedtime. 12/01/16  Yes Rice, Resa Miner, MD  cloNIDine (CATAPRES - DOSED IN MG/24 HR) 0.3 mg/24hr patch Place 0.3 mg onto the skin once a week. On Wednesday  12/31/16  Yes [provider]  hydrALAZINE (APRESOLINE) 100 MG tablet Take 1 tablet (100 mg total) by mouth 2 (two) times daily. 01/07/17  Yes Ledell Noss, MD  isosorbide mononitrate (IMDUR) 60 MG 24 hr tablet Take 1 tablet (60 mg total) by mouth daily. 01/08/17  Yes Ledell Noss, MD  metoCLOPramide (REGLAN) 5 MG tablet Take 1 tablet (5 mg total) by mouth every 8 (eight) hours as needed for nausea. 12/01/16  Yes Rice, Resa Miner, MD  pantoprazole (PROTONIX) 40 MG tablet Take 1 tablet (40 mg total) by mouth 2 (two) times daily before a meal. 12/01/16  Yes Rice, Resa Miner, MD  sevelamer carbonate (RENVELA) 800 MG tablet Take 1 tablet (800 mg total) by mouth 3 (three) times daily with meals. Patient taking differently: Take 800 mg by mouth 3 (three) times daily with meals. 800 mg as needed with snack 01/06/17  Yes Ledell Noss, MD  traMADol (ULTRAM) 50 MG tablet Take 1 tablet (50 mg total) by mouth every 12 (twelve) hours as needed for moderate pain or severe pain. 12/01/16  Yes Rice, Resa Miner, MD    Family History Family History  Problem Relation Age of Onset  . Hypertension Other     Social History Social History  Substance Use Topics  . Smoking status: Former Smoker    Packs/day: 0.00    Years: 1.00     Types: Cigarettes  . Smokeless tobacco: Never Used     Comment: quit Jan 2014  . Alcohol use No     Allergies   Butalbital-apap-caffeine; Ferrlecit [na ferric gluc cplx in sucrose]; Minoxidil; Tylenol [acetaminophen]; and Darvocet [propoxyphene n-acetaminophen]   Review of Systems Review of Systems  Respiratory: Negative for shortness of breath.   Cardiovascular: Negative for chest pain.  Musculoskeletal:       Arm pain and swelling  Skin: Negative for wound.  All other systems reviewed and are negative.    Physical Exam Updated Vital Signs BP (!) 188/111   Pulse 88   Temp 97.7 F (36.5 C) (Oral)   Resp 18   Wt 73.5 kg (162 lb)   SpO2 98%   BMI 20.80  kg/m   Physical Exam  Constitutional: He is oriented to person, place, and time. He appears well-developed and well-nourished. No distress.  HENT:  Head: Normocephalic and atraumatic.  Right Ear: Hearing normal.  Left Ear: Hearing normal.  Nose: Nose normal.  Mouth/Throat: Oropharynx is clear and moist and mucous membranes are normal.  Eyes: Pupils are equal, round, and reactive to light. Conjunctivae and EOM are normal.  Neck: Normal range of motion. Neck supple.  Cardiovascular: Regular rhythm, S1 normal and S2 normal.  Exam reveals no gallop and no friction rub.   No murmur heard. Pulmonary/Chest: Effort normal and breath sounds normal. No respiratory distress. He exhibits no tenderness.  Abdominal: Soft. Normal appearance and bowel sounds are normal. There is no hepatosplenomegaly. There is no tenderness. There is no rebound, no guarding, no tenderness at McBurney's point and negative Murphy's sign. No hernia.  Musculoskeletal: Normal range of motion.       Right forearm: He exhibits tenderness and swelling.  Neurological: He is alert and oriented to person, place, and time. He has normal strength. No cranial nerve deficit or sensory deficit. Coordination normal. GCS eye subscore is 4. GCS verbal subscore is 5. GCS  motor subscore is 6.  Skin: Skin is warm, dry and intact. No rash noted. No cyanosis.  Psychiatric: He has a normal mood and affect. His speech is normal and behavior is normal. Thought content normal.  Nursing note and vitals reviewed.    ED Treatments / Results  Labs (all labs ordered are listed, but only abnormal results are displayed) Labs Reviewed  CBC WITH DIFFERENTIAL/PLATELET - Abnormal; Notable for the following:       Result Value   RBC 1.74 (*)    Hemoglobin 4.8 (*)    HCT 14.7 (*)    RDW 19.5 (*)    All other components within normal limits  BASIC METABOLIC PANEL - Abnormal; Notable for the following:    Potassium 5.7 (*)    Chloride 99 (*)    BUN 78 (*)    Creatinine, Ser 16.08 (*)    Calcium 7.8 (*)    GFR calc non Af Amer 3 (*)    GFR calc Af Amer 3 (*)    Anion gap 17 (*)    All other components within normal limits  PROTIME-INR - Abnormal; Notable for the following:    Prothrombin Time 15.7 (*)    All other components within normal limits  APTT - Abnormal; Notable for the following:    aPTT 39 (*)    All other components within normal limits  I-STAT CHEM 8, ED - Abnormal; Notable for the following:    Potassium 6.1 (*)    BUN 74 (*)    Creatinine, Ser 17.20 (*)    Calcium, Ion 0.83 (*)    Hemoglobin 7.8 (*)    HCT 23.0 (*)    All other components within normal limits  HEPARIN LEVEL (UNFRACTIONATED)  CBC  BASIC METABOLIC PANEL  POC OCCULT BLOOD, ED  TYPE AND SCREEN    EKG  EKG Interpretation  Date/Time:  Thursday March 13 2017 02:49:02 EDT Ventricular Rate:  74 PR Interval:    QRS Duration: 91 QT Interval:  470 QTC Calculation: 522 R Axis:   -23 Text Interpretation:  Sinus rhythm Left ventricular hypertrophy Nonspecific T abnormalities, lateral leads Prolonged QT interval Confirmed by Orpah Greek [phone]) on 03/13/2017 2:51:37 AM       Radiology No results found.  Procedures Procedures (  including critical care  time)  Medications Ordered in ED Medications  heparin bolus via infusion 5,000 Units (0 Units Intravenous Hold 03/13/17 0235)  heparin ADULT infusion 100 units/mL (25000 units/277m sodium chloride 0.45%) (0 Units/hr Intravenous Hold 03/13/17 0236)  amLODipine (NORVASC) tablet 10 mg (10 mg Oral Given 03/13/17 0240)  cloNIDine (CATAPRES - Dosed in mg/24 hr) patch 0.3 mg (not administered)  isosorbide mononitrate (IMDUR) 24 hr tablet 60 mg (not administered)  metoCLOPramide (REGLAN) tablet 5 mg (not administered)  pantoprazole (PROTONIX) EC tablet 40 mg (not administered)  sevelamer carbonate (RENVELA) tablet 800 mg (not administered)  traMADol (ULTRAM) tablet 50 mg (not administered)  hydrALAZINE (APRESOLINE) tablet 100 mg (not administered)  hydrALAZINE (APRESOLINE) injection 20 mg (20 mg Intravenous Given 03/13/17 0054)  morphine 4 MG/ML injection 4 mg (4 mg Intravenous Given 03/13/17 0126)  ondansetron (ZOFRAN) injection 4 mg (4 mg Intravenous Given 03/13/17 0124)     Initial Impression / Assessment and Plan / ED Course  I have reviewed the triage vital signs and the nursing notes.  Pertinent labs & imaging results that were available during my care of the patient were reviewed by me and considered in my medical decision making (see chart for details).     Patient presents to the emergency department for treatment of right upper extremity DVT. Reviewing records reveals patient has had 2 previous DVTs as well as a PE in the past. He is not currently on anticoagulation. Patient is not experiencing any chest pain or shortness of breath. No suspicion for PE at this time.Discussed with Dr. WJustin Mend on call for nephrology. Recommends initiation of IV heparin. Patient will be seen by nephrology tomorrow for routine dialysis. He does not appear to be volume overloaded at this time.  Patient very hypertensive here in the ER. He reports that this is likely secondary to his pain. This may be partially  affecting his blood pressure, but initiated treatment with IV hydralazine.  Patient found to be very anemic. Hemoglobin is 4.8. It was 8.1 two days ago. Patient reports that he feels like he has been losing a lot of blood with dialysis. He has not noticed any bleeding. Rectal exam was heme-negative today. Blood was redrawn and repeat reveals hemoglobin of 7.8. Initial hemoglobin consistent with lab error.  Patient will be admitted, unassigned medicine.  Final Clinical Impressions(s) / ED Diagnoses   Final diagnoses:  Acute deep vein thrombosis (DVT) of right upper extremity, unspecified vein (HCC)  Hypertensive urgency  Hyperkalemia  ESRD on dialysis ([facility]    New Prescriptions New Prescriptions   No medications on file     POrpah Greek MD 03/13/17 [id number]   POrpah Greek MD 03/13/17 0149    POrpah Greek MD 03/13/17 [phone]

## 2017-03-13 NOTE — Consult Note (Signed)
[facility] Renal Consultation Note    Indication for Consultation:  Management of ESRD/hemodialysis; anemia, hypertension/volume and secondary hyperparathyroidism PCP:  HPI: Frank [name] is a 51 y.o. male with ESRD on hemodialysis MWF at [facility]. PMH of medical non-compliance, HTN, hyperkalemia, combined diastolic and systolic HF, nonischemic cardiomyopathy, AOCD, SHPT. Last hemodialysis was last Friday 03/08/17.   Patient presented to ED 03/09/17 with C/O RUE pain and swelling. US showed acute deep vein thrombosis involving the   axillary vein of the right upper extremity, age non- determinate DVT of the brachial vein in the right upper   extremity  03/11/17. He was encouraged to stay for treatment but told MD he would go to dialysis unit and follow up there (which he did not). He left [facility] 03/11/17. He came back to ED 03/12/17 because he was told by PCP that he had blood clot in arm and needed to be treated. K+ 5.7 on arrival to ED, HGB 4.8-rechecked and was 7.8. He has been admitted for RUE DVT per primary. Last K+ 6.1. He will have dialysis today on schedule.   Patient is currently awake, pleasant and cooperative. Says he left ED 03/11/17 because he had to go work but "I came back". No other issues other than RUE swelling. No HD since 03/08/17 Pre wt 75.9-is currently 4.9 over EDW. Starting HD now.   Past Medical History:  Diagnosis Date  . Anemia   . Anxiety   . Chronic combined systolic and diastolic CHF (congestive heart failure) (HCC)    a. EF 20-25% by echo in 08/2015 b. echo 10/2015: EF 35-40%, diffuse HK, severe LAE, moderate RAE, small pericardial effusion  . Complication of anesthesia    itching, sore throat  . Depression   . Dialysis patient ([place])   . DVT (deep venous thrombosis) ([place]) 02/2017  . ESRD (end stage renal disease) ([place])    due to HTN per patient, followed at [facility], s/p failed kidney transplant - dialysis Tue, Th, Sat  . Hyperkalemia  12/2015  . Hypertension   . Junctional rhythm    a. noted in 08/2015: hyperkalemic at that time  b. 12/2015: presented in junctional rhythm w/ K+ of 6.6. Resolved with improvement of K+ levels.  . Nonischemic cardiomyopathy ([place])    a. 08/2014: cath showing minimal CAD, but tortuous arteries noted.   . Personal history of DVT (deep vein thrombosis)/ PE 05/26/2016   In Oct 2015 had small subsemental LUL PE w/o DVT (LE dopplers neg) and was felt to be HD cath related, treated w coumadin.  IN May 2016 had small vein DVT (acute/subacute) in the R basilic/ brachial veins of the RUE, resumed on coumadin.  Had R sided HD cath at that time.    . Renal insufficiency   . Shortness of breath   . Type II diabetes mellitus (HCC)    No history per patient, but remains under history as A1c would not be accurate given on dialysis   Past Surgical History:  Procedure Laterality Date  . CAPD INSERTION    . CAPD REMOVAL    . INGUINAL HERNIA REPAIR Right 02/14/2015   Procedure: REPAIR INCARCERATED RIGHT INGUINAL HERNIA;  Surgeon: [name], MD;  Location: [facility];  Service: General;  Laterality: Right;  . INSERTION OF DIALYSIS CATHETER Right 09/23/2015   Procedure: exchange of Right internal Dialysis Catheter.;  Surgeon: [name], MD;  Location: [facility] OR;  Service: Vascular;  Laterality: Right;  . IR GENERIC HISTORICAL  07/16/2016   IR US GUIDE VASC ACCESS LEFT 07/16/2016 Corrie Mckusick, DO [facility]-INTERV RAD  . IR GENERIC HISTORICAL Left 07/16/2016   IR THROMBECTOMY AV FISTULA W/THROMBOLYSIS/PTA INC/SHUNT/IMG LEFT 07/16/2016 Corrie Mckusick, DO [facility]-INTER[facility] RAD  . KIDNEY RECEIPIENT  2006   failed and started HD in March 2014  . LEFT HEART CATHETERIZATION WITH CORONARY ANGIOGRAM N/A 09/02/2014   Procedure: LEFT HEART CATHETERIZATION WITH CORONARY ANGIOGRAM;  Surgeon: [name], MD;  Location: [facility] CATH LAB;  Service: Cardiovascular;  Laterality: N/A;   Family History  Problem Relation Age of Onset  . Hypertension  Other    Social History:  reports that he has quit smoking. His smoking use included Cigarettes. He smoked 0.00 packs per day for 1.00 year. He has never used smokeless tobacco. He reports that he uses drugs, including Marijuana. He reports that he does not drink alcohol. Allergies  Allergen Reactions  . Butalbital-Apap-Caffeine Shortness Of Breath, Swelling and Other (See Comments)    Swelling in throat  . Ferrlecit [Na Ferric Gluc Cplx In Sucrose] Shortness Of Breath, Swelling and Other (See Comments)    Swelling in throat, tolerates Venofor  . Minoxidil Shortness Of Breath  . Tylenol [Acetaminophen] Anaphylaxis and Swelling  . Darvocet [Propoxyphene N-Acetaminophen] Hives   Prior to Admission medications   Medication Sig Start Date End Date Taking? Authorizing Provider  amLODipine (NORVASC) 10 MG tablet Take 1 tablet (10 mg total) by mouth at bedtime. 12/01/16  Yes Rice, Resa Miner, MD  cloNIDine (CATAPRES - DOSED IN MG/24 HR) 0.3 mg/24hr patch Place 0.3 mg onto the skin once a week. On Wednesday  12/31/16  Yes [provider]  hydrALAZINE (APRESOLINE) 100 MG tablet Take 1 tablet (100 mg total) by mouth 2 (two) times daily. 01/07/17  Yes Ledell Noss, MD  isosorbide mononitrate (IMDUR) 60 MG 24 hr tablet Take 1 tablet (60 mg total) by mouth daily. 01/08/17  Yes Ledell Noss, MD  metoCLOPramide (REGLAN) 5 MG tablet Take 1 tablet (5 mg total) by mouth every 8 (eight) hours as needed for nausea. 12/01/16  Yes Rice, Resa Miner, MD  pantoprazole (PROTONIX) 40 MG tablet Take 1 tablet (40 mg total) by mouth 2 (two) times daily before a meal. 12/01/16  Yes Rice, Resa Miner, MD  sevelamer carbonate (RENVELA) 800 MG tablet Take 1 tablet (800 mg total) by mouth 3 (three) times daily with meals. Patient taking differently: Take 800 mg by mouth 3 (three) times daily with meals. 800 mg as needed with snack 01/06/17  Yes Ledell Noss, MD  traMADol (ULTRAM) 50 MG tablet Take 1 tablet (50 mg total) by  mouth every 12 (twelve) hours as needed for moderate pain or severe pain. 12/01/16  Yes Rice, Resa Miner, MD   Current Facility-Administered Medications  Medication Dose Route Frequency Provider Last Rate Last Dose  . amLODipine (NORVASC) tablet 10 mg  10 mg Oral QHS Jennette Kettle M, DO   10 mg at 03/13/17 0240  . [START ON 03/14/2017] calcitRIOL (ROCALTROL) capsule 3.5 mcg  3.5 mcg Oral Q M,W,F-HD Valentina Gu, NP      . cinacalcet [facility]) tablet 90 mg  90 mg Oral Q supper Valentina Gu, NP      . cloNIDine (CATAPRES - Dosed in mg/24 hr) patch 0.3 mg  0.3 mg Transdermal Weekly Alcario Drought, Jared M, DO   0.3 mg at 03/13/17 0400  . heparin ADULT infusion 100 units/mL (25000 units/253m sodium chloride 0.45%)  1,300 Units/hr Intravenous Continuous Millen,  Steffanie Dunn, RPH 13 mL/hr at 03/13/17 0311 1,300 Units/hr at 03/13/17 0311  . heparin bolus via infusion 5,000 Units  5,000 Units Intravenous Once Priscella Mann, [facility]   Stopped at 03/13/17 0235  . hydrALAZINE (APRESOLINE) injection 10-20 mg  10-20 mg Intravenous Q4H PRN [name], DO      . hydrALAZINE (APRESOLINE) tablet 100 mg  100 mg Oral BID Jennette Kettle M, DO      . isosorbide mononitrate (IMDUR) 24 hr tablet 60 mg  60 mg Oral Daily Jennette Kettle M, DO      . metoCLOPramide (REGLAN) injection 5 mg  5 mg Intravenous Q8H Jennette Kettle M, DO   5 mg at 03/13/17 0601  . ondansetron (ZOFRAN) tablet 4 mg  4 mg Oral Q6H PRN [name], DO       Or  . ondansetron [facility]) injection 4 mg  4 mg Intravenous Q6H PRN [name], DO      . oxyCODONE (Oxy IR/ROXICODONE) immediate release tablet 5 mg  5 mg Oral Q4H PRN [name], DO   5 mg at 03/13/17 1331  . pantoprazole (PROTONIX) EC tablet 40 mg  40 mg Oral BID AC Jennette Kettle M, DO   40 mg at 03/13/17 1320  . sevelamer carbonate (RENVELA) tablet 2,400 mg  2,400 mg Oral TID WC Valentina Gu, NP   2,400 mg at 03/13/17 1320  . sevelamer carbonate  (RENVELA) tablet 800 mg  800 mg Oral PRN Blenda Nicely, [facility]      . Warfarin - Pharmacist Dosing Inpatient   Does not apply q1800 Priscella Mann [facility]       Labs: Basic Metabolic Panel:  Recent Labs Lab 03/10/17 0438 03/13/17 0052 03/13/17 0247  NA 141 140 138  K 4.7 5.7* 6.1*  CL 100* 99* 101  CO2 29 24  --   GLUCOSE 99 77 85  BUN 42* 78* 74*  CREATININE 10.70* 16.08* 17.20*  CALCIUM 7.8* 7.8*  --    Liver Function Tests:  Recent Labs Lab 03/10/17 0438  AST 25  ALT 13*  ALKPHOS 116  BILITOT 0.7  PROT 6.9  ALBUMIN 2.8*   No results for input(s): LIPASE, AMYLASE in the last 168 hours. No results for input(s): AMMONIA in the last 168 hours. CBC:  Recent Labs Lab 03/10/17 0438 03/13/17 0052 03/13/17 0247  WBC 5.7 7.1  --   NEUTROABS 3.7 4.9  --   HGB 8.1* 4.8* 7.8*  HCT 26.1* 14.7* 23.0*  MCV 86.4 84.5  --   PLT 211 261  --    Cardiac Enzymes: No results for input(s): CKTOTAL, CKMB, CKMBINDEX, TROPONINI in the last 168 hours. CBG: No results for input(s): GLUCAP in the last 168 hours. Iron Studies: No results for input(s): IRON, TIBC, TRANSFERRIN, FERRITIN in the last 72 hours. Studies/Results: No results found.  ROS: As per HPI otherwise negative.   Physical Exam: Vitals:   03/13/17 0500 03/13/17 0735 03/13/17 0736 03/13/17 0900  BP: (!) 191/86 (!) 176/91  (!) 185/93  Pulse: 80   75  Resp: _0 Temp:    (!) 97.5 F (36.4 C)  TempSrc:    Oral  SpO2: 90%   98%  Weight:    72.8 kg (160 lb 7.9 oz)     General: Well appearing AA male in no acute distress. Head: Normocephalic, atraumatic, sclera non-icteric, mucus membranes are moist Neck: Supple. JVD not elevated. Lungs:  Clear bilaterally to auscultation without wheezes, rales, or rhonchi. Breathing is unlabored. Heart: RRR with S1 S2. No murmurs, rubs, or gallops appreciated. Abdomen: Soft, non-tender, non-distended with normoactive bowel sounds. No rebound/guarding. No obvious abdominal  masses. M-S:  Strength and tone appear normal for age. Lower extremities:without edema or ischemic changes, no open wounds  Neuro: Alert and oriented X 3. Moves all extremities spontaneously. Psych:  Responds to questions appropriately with a normal affect. Dialysis Access: LUA AVF + bruit  Dialysis Orders: [facility] MWF 4 hrs 180 NRe 400/Auto 200 71 kg 2.0K/2.25 Ca  -Heparin 2000 units IV TIW -Mircera 200 mcg IV (recently resumed-no dose yet Last HGB 9.2 02/26/17) -Calcitriol 3.5 mcg PO TIW (last PTH 1462 02/11/17)  BMD Meds: Renvela 800 mg 3 tabs PO TID AC and 2 tabs PO BID Sensipar 90 mg PO Q HS (Last Phos 6.4 Ca 8.4 C Ca 8.4)   Assessment/Plan: 1.  RUA DVT: On heparin bridging to heparin. Per primary 2. Hyperkalemia: K+ 6.1. HD today, 1 hour on 1 K bath then switch to 2.0 K bath. Recheck labs prior to HD 3.  ESRD - MWF. No HD since 03/08/17. Hyperkalemia as noted above. Hold heparin as he is on heparin drip.  4.  Hypertension/volume  -Hypertensive at present. Resumed BP meds-UF to OP EDW. Current wt 72.8 2-2.5 liters 5.  Anemia  -HGB 7.8. No Recent ESA. Give Aranesp 200 mcg IV today. Follow HGB  6.  Metabolic bone disease -  Resume BMD meds.  7.  Nutrition -Renal diet, renal vit, nepro. 8. H/O combined systolic/diastolic HF-monitor volume. 4.9 over EDW but does not appear volume overloaded by exam.   Jimmye Norman. Owens Shark, NP-C 03/13/2017, 1:57 PM  [employer] [phone]  Pt seen, examined and agree w A/P as above.  Kelly Splinter MD [employer] pager [phone]   03/13/2017, 5:27 PM

## 2017-03-13 NOTE — Progress Notes (Signed)
Patient arrived to unit per bed.  Reviewed treatment plan and this RN agrees.  Report received from bedside RN, Estill Dooms.  Consent obatined.  Patient A & O X 4. Lung sounds diminished to ausculation in all fields. BLE 2+ edema. Cardiac: NSR.  Prepped LLAVF with alcohol and cannulated with two 15 gauge needles.  Pulsation of blood noted.  Flushed access well with saline per protocol.  Connected and secured lines and initiated tx at 1535.  UF goal of 5000 mL and net fluid removal of 4500 mL.  Will continue to monitor.

## 2017-03-13 NOTE — Progress Notes (Signed)
ANTICOAGULATION CONSULT NOTE - Follow Up Consult  Pharmacy Consult for Heparin Indication: RUE DVT (and history of DVT and PE)  Allergies  Allergen Reactions  . Butalbital-Apap-Caffeine Shortness Of Breath, Swelling and Other (See Comments)    Swelling in throat  . Ferrlecit [Na Ferric Gluc Cplx In Sucrose] Shortness Of Breath, Swelling and Other (See Comments)    Swelling in throat, tolerates Venofor  . Minoxidil Shortness Of Breath  . Tylenol [Acetaminophen] Anaphylaxis and Swelling  . Darvocet [Propoxyphene N-Acetaminophen] Hives    Patient Measurements: Weight: 167 lb 5.3 oz (75.9 kg) (standing) Heparin Dosing Weight: 73.5 kg  Vital Signs: Temp: 98 F (36.7 C) (08/23 1523) Temp Source: Oral (08/23 0900) BP: 198/112 (08/23 1830) Pulse Rate: 81 (08/23 1830)  Labs:  Recent Labs  03/13/17 0052 03/13/17 0247 03/13/17 1548 03/13/17 1821  HGB 4.8* 7.8* 8.3*  --   HCT 14.7* 23.0* 26.8*  --   PLT 261  --  246  --   APTT 39*  --   --   --   LABPROT 15.7*  --   --  15.2  INR 1.24  --   --  1.20  HEPARINUNFRC  --   --   --  0.12*  CREATININE 16.08* 17.20* 17.11*  --     Estimated Creatinine Clearance: 5.4 mL/min (A) (by C-G formula based on SCr of 17.11 mg/dL (H)).  Medications: Heparin @ 1300 units/hr  Assessment: 51 year old male with ESRD and RUE DVT diagnosed on 8/20 but refused admission and treatment, returned to the ED and was started on IV heparin this morning. Initial heparin level is low at 0.12. No issues with infusion.  Goal of Therapy:  Heparin level 0.3-0.7 units/ml Monitor platelets by anticoagulation protocol: Yes   Plan:  1) Increase heparin to 1550 units/hr 2) Check 8 hour heparin level  Nena Jordan, PharmD, BCPS 03/13/2017, 6:57 PM

## 2017-03-13 NOTE — ED Notes (Signed)
Pt fistula in left arm; blood clot in right arm above AC area. Per EDP, this RN can start PIV in rt hand.

## 2017-03-13 NOTE — Progress Notes (Signed)
  PROGRESS NOTE    Frank [name]  MBP:[id number] DOB: 07/09/1966 DOA: 03/13/2017 PCP: Lolita Patella, MD   Chief Complaint  Patient presents with  . DVT    Brief Narrative:  HPI on 03/13/2017 by Dr. Jennette Kettle Frank [name] is a 51 y.o. male with medical history significant of ESRD with dialysis MWF.  Missed dialysis yesterday. Patient recently diagnosed with RUE DVT.  Nephrology recommended hospital admission for heparin bridge to coumadin when he was seen in ED on the 20th.  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  He has come back today for admission. Assessment & Plan   Right Upper Extremity DVT -Right upper extremity Doppler on 03/11/2017 showed DVT involving axillary vein, chronic DVT of the axillary vein. Non-determinant DVT of the brachial vein in the right upper extremity. Superficial acute thrombosis of the right basilic and cephalic veins. -Patient placed on Coumadin, bridging with heparin -Per patient, this is his second blood clot. He was on Coumadin for almost a year. -INR 1.24  ESRD -Patient dialyzes MWF -Nephrology consulted and appreciated -Patient missed dialysis on 03/12/2017  Hyperkalemia -Likely secondary to missed hemodialysis -Suspect will correct with HD -Continue to monitor BMP  Anemia of Chronic Disease -Baseline hemoglobin approximately 10 -Currently hemoglobin 7.8, continue to monitor CBC -Of note, hemoglobin was down to 4.8 however feel this is an erroneous result -FOBT negative  Essential hypertension -Continue amlodipine, clonidine, hydralazine, imdur -Continue hydralazine IV when necessary  GERD -Continue PPI  DVT Prophylaxis  Coumadin/Heparin  Code Status: Full  Family Communication: none at bedside  Disposition Plan: Admitted, pending therapeutic INR  Consultants Nephrology  Procedures  none   LOS: 0 days   Time Spent in minutes   30 minutes  Cyntia Staley D.O. on 03/13/2017 at 2:35 PM  Between 7am  to 7pm - Pager - [phone]  After 7pm go to [email or web address] - password [email or web address]  And look for the night coverage person covering for me after hours  [facility] Office  [phone]

## 2017-03-13 NOTE — H&P (Signed)
History and Physical    Frank [name] BOF:[id number] DOB: 04/19/1966 DOA: 03/13/2017  PCP: Lolita Patella, MD  Patient coming from: Home  I have personally briefly reviewed patient's old medical records in [place]  Chief Complaint: RUE DVT  HPI: Frank [name] is a 51 y.o. male with medical history significant of ESRD with dialysis MWF.  Missed dialysis yesterday.  Patient recently diagnosed with RUE DVT.  Nephrology recommended hospital admission for heparin bridge to coumadin when he was seen in ED on the 20th.  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  He has come back today for admission.   ED Course: K 5.7.  Initially lab work concerning when his HGB comes back at 4.8 (8.1 x3 days ago); however, patient has no known bleeding history, hemoccult negative, other than his arm hurting from the DVT feels otherwise fine.  And thankfully a repeat hemoglobin comes back at 7.8.   Review of Systems: As per HPI otherwise 10 point review of systems negative.   Past Medical History:  Diagnosis Date  . Anemia   . Anxiety   . Chronic combined systolic and diastolic CHF (congestive heart failure) (HCC)    a. EF 20-25% by echo in 08/2015 b. echo 10/2015: EF 35-40%, diffuse HK, severe LAE, moderate RAE, small pericardial effusion  . Complication of anesthesia    itching, sore throat  . Depression   . Dialysis patient ([place])   . ESRD (end stage renal disease) ([place])    due to HTN per patient, followed at [facility], s/p failed kidney transplant - dialysis Tue, Th, Sat  . Hyperkalemia 12/2015  . Hypertension   . Junctional rhythm    a. noted in 08/2015: hyperkalemic at that time  b. 12/2015: presented in junctional rhythm w/ K+ of 6.6. Resolved with improvement of K+ levels.  . Nonischemic cardiomyopathy ([place])    a. 08/2014: cath showing minimal CAD, but tortuous arteries noted.   . Personal history of DVT (deep vein thrombosis)/ PE 05/26/2016   In Oct 2015 had small  subsemental LUL PE w/o DVT (LE dopplers neg) and was felt to be HD cath related, treated w coumadin.  IN May 2016 had small vein DVT (acute/subacute) in the R basilic/ brachial veins of the RUE, resumed on coumadin.  Had R sided HD cath at that time.    . Renal insufficiency   . Shortness of breath   . Type II diabetes mellitus (HCC)    No history per patient, but remains under history as A1c would not be accurate given on dialysis    Past Surgical History:  Procedure Laterality Date  . CAPD INSERTION    . CAPD REMOVAL    . INGUINAL HERNIA REPAIR Right 02/14/2015   Procedure: REPAIR INCARCERATED RIGHT INGUINAL HERNIA;  Surgeon: [name], MD;  Location: [place];  Service: General;  Laterality: Right;  . INSERTION OF DIALYSIS CATHETER Right 09/23/2015   Procedure: exchange of Right internal Dialysis Catheter.;  Surgeon: [name], MD;  Location: [place];  Service: Vascular;  Laterality: Right;  . IR GENERIC HISTORICAL  07/16/2016   IR US GUIDE VASC ACCESS LEFT 07/16/2016 Corrie Mckusick, DO [facility]  . IR GENERIC HISTORICAL Left 07/16/2016   IR THROMBECTOMY AV FISTULA W/THROMBOLYSIS/PTA INC/SHUNT/IMG LEFT 07/16/2016 Corrie Mckusick, DO [facility]  . KIDNEY RECEIPIENT  2006   failed and started HD in March 2014  . LEFT HEART CATHETERIZATION WITH CORONARY ANGIOGRAM N/A  09/02/2014   Procedure: LEFT HEART CATHETERIZATION WITH CORONARY ANGIOGRAM;  Surgeon: [name], MD;  Location: [facility] CATH LAB;  Service: Cardiovascular;  Laterality: N/A;     reports that he has quit smoking. His smoking use included Cigarettes. He smoked 0.00 packs per day for 1.00 year. He has never used smokeless tobacco. He reports that he does not drink alcohol or use drugs.  Allergies  Allergen Reactions  . Butalbital-Apap-Caffeine Shortness Of Breath, Swelling and Other (See Comments)    Swelling in throat  . Ferrlecit [Na Ferric Gluc Cplx In Sucrose] Shortness Of Breath, Swelling and Other (See Comments)     Swelling in throat, tolerates Venofor  . Minoxidil Shortness Of Breath  . Tylenol [Acetaminophen] Anaphylaxis and Swelling  . Darvocet [Propoxyphene N-Acetaminophen] Hives    Family History  Problem Relation Age of Onset  . Hypertension Other      Prior to Admission medications   Medication Sig Start Date End Date Taking? Authorizing Provider  amLODipine (NORVASC) 10 MG tablet Take 1 tablet (10 mg total) by mouth at bedtime. 12/01/16  Yes Rice, Resa Miner, MD  cloNIDine (CATAPRES - DOSED IN MG/24 HR) 0.3 mg/24hr patch Place 0.3 mg onto the skin once a week. On Wednesday  12/31/16  Yes [provider]  hydrALAZINE (APRESOLINE) 100 MG tablet Take 1 tablet (100 mg total) by mouth 2 (two) times daily. 01/07/17  Yes Ledell Noss, MD  isosorbide mononitrate (IMDUR) 60 MG 24 hr tablet Take 1 tablet (60 mg total) by mouth daily. 01/08/17  Yes Ledell Noss, MD  metoCLOPramide (REGLAN) 5 MG tablet Take 1 tablet (5 mg total) by mouth every 8 (eight) hours as needed for nausea. 12/01/16  Yes Rice, Resa Miner, MD  pantoprazole (PROTONIX) 40 MG tablet Take 1 tablet (40 mg total) by mouth 2 (two) times daily before a meal. 12/01/16  Yes Rice, Resa Miner, MD  sevelamer carbonate (RENVELA) 800 MG tablet Take 1 tablet (800 mg total) by mouth 3 (three) times daily with meals. Patient taking differently: Take 800 mg by mouth 3 (three) times daily with meals. 800 mg as needed with snack 01/06/17  Yes Ledell Noss, MD  traMADol (ULTRAM) 50 MG tablet Take 1 tablet (50 mg total) by mouth every 12 (twelve) hours as needed for moderate pain or severe pain. 12/01/16  Yes Collier Salina, MD    Physical Exam: Vitals:   03/13/17 0130 03/13/17 0241 03/13/17 0246 03/13/17 0255  BP: (!) 188/111 (!) 183/90  (!) 171/98  Pulse: 88  81 76  Resp:   (!) 8 19  Temp:      TempSrc:      SpO2: 98%  93% 95%  Weight:        Constitutional: NAD, calm, comfortable Eyes: PERRL, lids and conjunctivae normal ENMT:  Mucous membranes are moist. Posterior pharynx clear of any exudate or lesions.Normal dentition.  Neck: normal, supple, no masses, no thyromegaly Respiratory: clear to auscultation bilaterally, no wheezing, no crackles. Normal respiratory effort. No accessory muscle use.  Cardiovascular: Regular rate and rhythm, no murmurs / rubs / gallops. No extremity edema. 2+ pedal pulses. No carotid bruits.  Abdomen: no tenderness, no masses palpated. No hepatosplenomegaly. Bowel sounds positive.  Musculoskeletal: no clubbing / cyanosis. No joint deformity upper and lower extremities. Good ROM, no contractures. Normal muscle tone.  Skin: no rashes, lesions, ulcers. No induration Neurologic: CN 2-12 grossly intact. Sensation intact, DTR normal. Strength 5/5 in all 4.  Psychiatric: Normal judgment  and insight. Alert and oriented x 3. Normal mood.    Labs on Admission: I have personally reviewed following labs and imaging studies  CBC:  Recent Labs Lab 03/10/17 0438 03/13/17 0052 03/13/17 0247  WBC 5.7 7.1  --   NEUTROABS 3.7 4.9  --   HGB 8.1* 4.8* 7.8*  HCT 26.1* 14.7* 23.0*  MCV 86.4 84.5  --   PLT 211 261  --    Basic Metabolic Panel:  Recent Labs Lab 03/10/17 0438 03/13/17 0052 03/13/17 0247  NA 141 140 138  K 4.7 5.7* 6.1*  CL 100* 99* 101  CO2 29 24  --   GLUCOSE 99 77 85  BUN 42* 78* 74*  CREATININE 10.70* 16.08* 17.20*  CALCIUM 7.8* 7.8*  --    GFR: Estimated Creatinine Clearance: 5.2 mL/min (A) (by C-G formula based on SCr of 17.2 mg/dL (H)). Liver Function Tests:  Recent Labs Lab 03/10/17 0438  AST 25  ALT 13*  ALKPHOS 116  BILITOT 0.7  PROT 6.9  ALBUMIN 2.8*   No results for input(s): LIPASE, AMYLASE in the last 168 hours. No results for input(s): AMMONIA in the last 168 hours. Coagulation Profile:  Recent Labs Lab 03/10/17 0438 03/13/17 0052  INR 1.17 1.24   Cardiac Enzymes: No results for input(s): CKTOTAL, CKMB, CKMBINDEX, TROPONINI in the last 168  hours. BNP (last 3 results) No results for input(s): PROBNP in the last 8760 hours. HbA1C: No results for input(s): HGBA1C in the last 72 hours. CBG: No results for input(s): GLUCAP in the last 168 hours. Lipid Profile: No results for input(s): CHOL, HDL, LDLCALC, TRIG, CHOLHDL, LDLDIRECT in the last 72 hours. Thyroid Function Tests: No results for input(s): TSH, T4TOTAL, FREET4, T3FREE, THYROIDAB in the last 72 hours. Anemia Panel: No results for input(s): VITAMINB12, FOLATE, FERRITIN, TIBC, IRON, RETICCTPCT in the last 72 hours. Urine analysis:    Component Value Date/Time   COLORURINE YELLOW 10/18/2013 0419   APPEARANCEUR CLEAR 10/18/2013 0419   LABSPEC 1.008 10/18/2013 0419   PHURINE 8.5 (H) 10/18/2013 0419   GLUCOSEU 100 (A) 10/18/2013 0419   HGBUR TRACE (A) 10/18/2013 0419   BILIRUBINUR NEGATIVE 10/18/2013 0419   KETONESUR NEGATIVE 10/18/2013 0419   PROTEINUR 100 (A) 10/18/2013 0419   UROBILINOGEN 0.2 10/18/2013 0419   NITRITE NEGATIVE 10/18/2013 0419   LEUKOCYTESUR NEGATIVE 10/18/2013 0419    Radiological Exams on Admission: No results found.  EKG: Independently reviewed.  Assessment/Plan Principal Problem:   DVT (deep venous thrombosis) (HCC) Active Problems:   Anemia of chronic kidney failure   ESRD on hemodialysis (HCC)   Malignant hypertension   Personal history of DVT (deep vein thrombosis)/ PE    1. DVT - 1. Heparin bridge to coumadin 2. ESRD - 1. With mild hyperkalemia of 5.7 2. Dr. Justin Mend aware, dialysis in AM 3. No EKG changes 4. Will keep him on tele monitor with the hyperkalemia 3. Anemia of CKD - 1. Chronic and after repeat HGB comes back at 7.8 which is close to 8.1 from 3 days ago, believed to be stable.  The HGB 4.8 is believed to be a lab error for reasons outlined in HPI above. 4. Malignant HTN - 1. Continue home BP meds 2. Hydralazine IV if unable to keep these down  DVT prophylaxis: heparin gtt bridge to coumadin Code Status:  Full Family Communication: No family in room Disposition Plan: Home after admit Consults called: EDP spoke with Dr. Justin Mend about dialysis in AM Admission status: Admit  to inpatient - will need several days of heparin gtt while we wait for coumadin to become theraputic   [name] DO [facility] Pager [phone]  If 7AM-7PM, please contact day team taking care of patient [email or web address] Password [email or web address]  03/13/2017, 3:03 AM

## 2017-03-13 NOTE — ED Notes (Signed)
Attempted to call report to [address] x 1.

## 2017-03-13 NOTE — Progress Notes (Signed)
Dialysis treatment completed.  4800 mL ultrafiltrated and net fluid removal 4200 mL.    Patient status unchanged. Lung sounds diminisehd to ausculation in all fields. BLE edema. Cardiac: NSR.  Disconnected lines and removed needles.  Pressure held for 15 minutes and band aid/gauze dressing applied.  Report given to bedside RN, Blanch Media.

## 2017-03-13 NOTE — ED Notes (Signed)
Patient had taken off all monitoring. Sound asleep, snoring, when RN entered room. RN awakened patient. Asked about patient condition/pain. Patient then states pain 7/10 on 0-10 pain scale, and fell back to sleep.

## 2017-03-14 DIAGNOSIS — N189 Chronic kidney disease, unspecified: Secondary | ICD-10-CM

## 2017-03-14 DIAGNOSIS — I160 Hypertensive urgency: Secondary | ICD-10-CM

## 2017-03-14 LAB — HEPARIN LEVEL (UNFRACTIONATED)
HEPARIN UNFRACTIONATED: 0.13 [IU]/mL — AB (ref 0.30–0.70)
HEPARIN UNFRACTIONATED: 0.92 [IU]/mL — AB (ref 0.30–0.70)
Heparin Unfractionated: 0.3 IU/mL (ref 0.30–0.70)

## 2017-03-14 LAB — BASIC METABOLIC PANEL
ANION GAP: 13 (ref 5–15)
BUN: 30 mg/dL — ABNORMAL HIGH (ref 6–20)
CHLORIDE: 97 mmol/L — AB (ref 101–111)
CO2: 26 mmol/L (ref 22–32)
Calcium: 7.8 mg/dL — ABNORMAL LOW (ref 8.9–10.3)
Creatinine, Ser: 8.95 mg/dL — ABNORMAL HIGH (ref 0.61–1.24)
GFR calc non Af Amer: 6 mL/min — ABNORMAL LOW (ref >60)
GFR, EST AFRICAN AMERICAN: 7 mL/min — AB (ref >60)
Glucose, Bld: 76 mg/dL (ref 65–99)
POTASSIUM: 4.6 mmol/L (ref 3.5–5.1)
SODIUM: 136 mmol/L (ref 135–145)

## 2017-03-14 LAB — CBC
HCT: 25.5 % — ABNORMAL LOW (ref 39.0–52.0)
Hemoglobin: 8.4 g/dL — ABNORMAL LOW (ref 13.0–17.0)
MCH: 28.1 pg (ref 26.0–34.0)
MCHC: 32.9 g/dL (ref 30.0–36.0)
MCV: 85.3 fL (ref 78.0–100.0)
Platelets: 195 10*3/uL (ref 150–400)
RBC: 2.99 MIL/uL — AB (ref 4.22–5.81)
RDW: 19.5 % — AB (ref 11.5–15.5)
WBC: 5.6 10*3/uL (ref 4.0–10.5)

## 2017-03-14 LAB — PROTIME-INR
INR: 1.3
Prothrombin Time: 16.3 seconds — ABNORMAL HIGH (ref 11.4–15.2)

## 2017-03-14 MED ORDER — WARFARIN SODIUM 5 MG PO TABS
5.0000 mg | ORAL_TABLET | Freq: Once | ORAL | Status: AC
  Administered 2017-03-14: 5 mg via ORAL
  Filled 2017-03-14 (×2): qty 1

## 2017-03-14 MED ORDER — HEPARIN BOLUS VIA INFUSION
2000.0000 [IU] | Freq: Once | INTRAVENOUS | Status: AC
  Administered 2017-03-14: 2000 [IU] via INTRAVENOUS
  Filled 2017-03-14: qty 2000

## 2017-03-14 MED ORDER — WARFARIN SODIUM 7.5 MG PO TABS: 7.5000 mg | ORAL_TABLET | Freq: Once | ORAL | Status: DC

## 2017-03-14 NOTE — Progress Notes (Addendum)
ANTICOAGULATION CONSULT NOTE - Follow Up Consult  Pharmacy Consult for Heparin/warfarin Indication: RUE DVT (and history of DVT and PE)  Allergies  Allergen Reactions  . Butalbital-Apap-Caffeine Shortness Of Breath, Swelling and Other (See Comments)    Swelling in throat  . Ferrlecit [Na Ferric Gluc Cplx In Sucrose] Shortness Of Breath, Swelling and Other (See Comments)    Swelling in throat, tolerates Venofor  . Minoxidil Shortness Of Breath  . Tylenol [Acetaminophen] Anaphylaxis and Swelling  . Darvocet [Propoxyphene N-Acetaminophen] Hives    Patient Measurements: Height: _0  (188 cm) Weight: 158 lb 11.7 oz (72 kg) IBW/kg (Calculated) : 82.2 Heparin Dosing Weight: 73.5 kg  Vital Signs: Temp: 97.5 F (36.4 C) (08/24 1424) Temp Source: Oral (08/24 1424) BP: 139/87 (08/24 1500) Pulse Rate: 72 (08/24 1500)  Labs:  Recent Labs  03/13/17 0052 03/13/17 0247 03/13/17 1548 03/13/17 1821 03/14/17 0304 03/14/17 1222  HGB 4.8* 7.8* 8.3*  --  8.4*  --   HCT 14.7* 23.0* 26.8*  --  25.5*  --   PLT 261  --  246  --  195  --   APTT 39*  --   --   --   --   --   LABPROT 15.7*  --   --  15.2 16.3*  --   INR 1.24  --   --  1.20 1.30  --   HEPARINUNFRC  --   --   --  0.12* 0.13* 0.92*  CREATININE 16.08* 17.20* 17.11*  --  8.95*  --     Estimated Creatinine Clearance: 9.8 mL/min (A) (by C-G formula based on SCr of 8.95 mg/dL (H)).  Medications: Heparin @ 1300 units/hr  Assessment: 51 year old male with ESRD and RUE DVT diagnosed on 8/20 but refused admission and treatment, returned to the ED and was started on IV heparin and now started on warfarin. HL this afternooncame back very high at 0.92, spoke to HD RN and patient verified that blood was drawn from where the heparin drip was running. Ordered a STAT HL to clarify. No issues with infusion or bleeding per RN.  Patient has taken warfarin in the past and it looks like he was on 7 mg daily. INR baseline 1.17, received 1 x 10  mg dose and INR is 1.3 today. Sounds like patient may leave 8/25.   Goal of Therapy:  Heparin level 0.3-0.7 units/ml Monitor platelets by anticoagulation protocol: Yes   Plan:  1) Continue heparin to 1800 units/hr 2) STAT heparin level 3) Give warfarin 5 mg x 1   Thank you for allowing [place] to participate in this patients care.  Jens Som, PharmD Clinical phone for 03/14/2017 from 7a-3:30p: [phone] If after 3:30p, please call main pharmacy at: [phone] 03/14/2017 3:51 PM

## 2017-03-14 NOTE — Plan of Care (Signed)
Problem: Education: Goal: Knowledge of [place] [facility] Education information/materials will improve Outcome: Progressing POC reviewed with pt.

## 2017-03-14 NOTE — Progress Notes (Signed)
PROGRESS NOTE    LIVINGSTON DENNER  GUR:[id number] DOB: 12-27-1965 DOA: 03/13/2017 PCP: Lolita Patella, MD   Chief Complaint  Patient presents with  . DVT    Brief Narrative:  HPI on 03/13/2017 by Dr. Harrington Challenger [name] a 51 y.o.malewith medical history significant of ESRD with dialysis MWF. Missed dialysis yesterday. Patient recently diagnosed with RUE DVT. Nephrology recommended hospital admission for heparin bridge to coumadin when he was seen in ED on the 20th. 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 He has come back today for admission. Assessment & Plan   Right Upper Extremity DVT -Right upper extremity Doppler on 03/11/2017 showed DVT involving axillary vein, chronic DVT of the axillary vein. Non-determinant DVT of the brachial vein in the right upper extremity. Superficial acute thrombosis of the right basilic and cephalic veins. -Patient placed on Coumadin, bridging with heparin -Per patient, this is his second blood clot. Patient states he's been off of Coumadin for a year. -INR 1.30 -Patient states that he will stay until 03/15/2017, but will likely leave [place]. Explained to patient the risks of leaving before being completely and appropriately bridged with heparin- can lead to further blood clots. Explained to patient that he will likely NOT be therapeutic by 03/15/17. -Will consult case management to help in arranging for INR checks  ESRD -Patient dialyzes MWF -Nephrology consulted and appreciated -Patient missed dialysis on 03/12/2017 -Patient dialyzed 03/13/2017  Hyperkalemia -Likely secondary to missed hemodialysis -Resolved, continue to monitor BMP  Anemia of Chronic Disease -Baseline hemoglobin approximately 10 -Hemoglobin currently 8.4, continue to monitor CBC -FOBT negative  Essential hypertension -Continue amlodipine, clonidine, hydralazine, imdur -Continue hydralazine IV when necessary  GERD -Continue  PPI  Chronic systolic CHF -Echocardiogram 10/31/2015 EF 35-40%  -Currently appears euvolemic and compensated -Continue volume control with HD  DVT Prophylaxis  Coumadin/Heparin  Code Status: Full  Family Communication: none at bedside  Disposition Plan: Admitted, pending therapeutic INR  Consultants Nephrology  Procedures  None  Antibiotics   Anti-infectives    None      Subjective:   [place] seen and examined today.  Patient denies chest pain, shortness of breath, abdominal pain, nausea, vomiting, diarrhea, constipation.   Objective:   Vitals:   03/13/17 1957 03/13/17 2100 03/14/17 0659 03/14/17 1100  BP: (!) 188/104  (!) 167/100 (!) 150/108  Pulse: 86 89 80   Resp:  18 18   Temp:  98.6 F (37 C) 98.5 F (36.9 C)   TempSrc:  Oral Oral   SpO2:   100%   Weight:  71.4 kg (157 lb 8 oz)    Height:  _0  (1.88 m)      Intake/Output Summary (Last 24 hours) at 03/14/17 1135 Last data filed at 03/14/17 2831  Gross per 24 hour  Intake                0 ml  Output             4200 ml  Net            -4200 ml   Filed Weights   03/13/17 1332 03/13/17 1935 03/13/17 2100  Weight: 75.9 kg (167 lb 5.3 oz) 71.6 kg (157 lb 13.6 oz) 71.4 kg (157 lb 8 oz)    Exam  General: Well developed, well nourished, NAD, appears stated age  HEENT: NCAT, mucous membranes moist.   Cardiovascular: S1 S2 auscultated, no rubs, murmurs or  gallops. Regular rate and rhythm.  Respiratory: Clear to auscultation bilaterally with equal chest rise  Abdomen: Soft, nontender, nondistended, + bowel sounds  Extremities: warm dry without cyanosis clubbing or edema. LUE AVF +B  Neuro: AAOx3, nonfocal  Psych: Appropriate mood and affect  Data Reviewed: I have personally reviewed following labs and imaging studies  CBC:  Recent Labs Lab 03/10/17 0438 03/13/17 0052 03/13/17 0247 03/13/17 1548 03/14/17 0304  WBC 5.7 7.1  --  5.2 5.6  NEUTROABS 3.7 4.9  --   --   --   HGB  8.1* 4.8* 7.8* 8.3* 8.4*  HCT 26.1* 14.7* 23.0* 26.8* 25.5*  MCV 86.4 84.5  --  84.5 85.3  PLT 211 261  --  246 417   Basic Metabolic Panel:  Recent Labs Lab 03/10/17 0438 03/13/17 0052 03/13/17 0247 03/13/17 1548 03/14/17 0304  NA 141 140 138 138 136  K 4.7 5.7* 6.1* 6.0* 4.6  CL 100* 99* 101 99* 97*  CO2 29 24  --  24 26  GLUCOSE 99 77 85 92 76  BUN 42* 78* 74* 83* 30*  CREATININE 10.70* 16.08* 17.20* 17.11* 8.95*  CALCIUM 7.8* 7.8*  --  7.9* 7.8*  PHOS  --   --   --  6.0*  --    GFR: Estimated Creatinine Clearance: 9.8 mL/min (A) (by C-G formula based on SCr of 8.95 mg/dL (H)). Liver Function Tests:  Recent Labs Lab 03/10/17 0438 03/13/17 1548  AST 25  --   ALT 13*  --   ALKPHOS 116  --   BILITOT 0.7  --   PROT 6.9  --   ALBUMIN 2.8* 3.0*   No results for input(s): LIPASE, AMYLASE in the last 168 hours. No results for input(s): AMMONIA in the last 168 hours. Coagulation Profile:  Recent Labs Lab 03/10/17 0438 03/13/17 0052 03/13/17 1821 03/14/17 0304  INR 1.17 1.24 1.20 1.30   Cardiac Enzymes: No results for input(s): CKTOTAL, CKMB, CKMBINDEX, TROPONINI in the last 168 hours. BNP (last 3 results) No results for input(s): PROBNP in the last 8760 hours. HbA1C: No results for input(s): HGBA1C in the last 72 hours. CBG: No results for input(s): GLUCAP in the last 168 hours. Lipid Profile: No results for input(s): CHOL, HDL, LDLCALC, TRIG, CHOLHDL, LDLDIRECT in the last 72 hours. Thyroid Function Tests: No results for input(s): TSH, T4TOTAL, FREET4, T3FREE, THYROIDAB in the last 72 hours. Anemia Panel: No results for input(s): VITAMINB12, FOLATE, FERRITIN, TIBC, IRON, RETICCTPCT in the last 72 hours. Urine analysis:    Component Value Date/Time   COLORURINE YELLOW 10/18/2013 0419   APPEARANCEUR CLEAR 10/18/2013 0419   LABSPEC 1.008 10/18/2013 0419   PHURINE 8.5 (H) 10/18/2013 0419   GLUCOSEU 100 (A) 10/18/2013 0419   HGBUR TRACE (A) 10/18/2013  0419   BILIRUBINUR NEGATIVE 10/18/2013 0419   KETONESUR NEGATIVE 10/18/2013 0419   PROTEINUR 100 (A) 10/18/2013 0419   UROBILINOGEN 0.2 10/18/2013 0419   NITRITE NEGATIVE 10/18/2013 0419   LEUKOCYTESUR NEGATIVE 10/18/2013 0419   Sepsis Labs: _0 (procalcitonin:4,lacticidven:4)  ) Recent Results (from the past 240 hour(s))  Blood culture (routine x 2)     Status: None (Preliminary result)   Collection Time: 03/10/17  4:38 AM  Result Value Ref Range Status   Specimen Description BLOOD RIGHT HAND  Final   Special Requests   Final    BOTTLES DRAWN AEROBIC AND ANAEROBIC Blood Culture adequate volume   Culture NO GROWTH 3 DAYS  Final   Report  Status PENDING  Incomplete      Radiology Studies: No results found.   Scheduled Meds: . amLODipine  10 mg Oral QHS  . calcitRIOL  3.5 mcg Oral Q M,W,F-HD  . cinacalcet  90 mg Oral Q supper  . cloNIDine  0.3 mg Transdermal Weekly  . hydrALAZINE  100 mg Oral BID  . isosorbide mononitrate  60 mg Oral Daily  . pantoprazole  40 mg Oral BID AC  . sevelamer carbonate  2,400 mg Oral TID WC  . Warfarin - Pharmacist Dosing Inpatient   Does not apply q1800   Continuous Infusions: . heparin 1,800 Units/hr (03/14/17 0413)     LOS: 1 day   Time Spent in minutes   30 minutes  Breydon Senters D.O. on 03/14/2017 at 11:35 AM  Between 7am to 7pm - Pager - [phone]  After 7pm go to [email or web address] - password [email or web address]  And look for the night coverage person covering for me after hours  [facility] Office  [phone]

## 2017-03-14 NOTE — Progress Notes (Signed)
ANTICOAGULATION CONSULT NOTE - Follow Up Consult  Pharmacy Consult for HEPARIN Indication: DVT  Labs:  Recent Labs  03/13/17 0052 03/13/17 0247 03/13/17 1548 03/13/17 1821 03/14/17 0304  HGB 4.8* 7.8* 8.3*  --  8.4*  HCT 14.7* 23.0* 26.8*  --  25.5*  PLT 261  --  246  --  195  APTT 39*  --   --   --   --   LABPROT 15.7*  --   --  15.2  --   INR 1.24  --   --  1.20  --   HEPARINUNFRC  --   --   --  0.12* 0.13*  CREATININE 16.08* 17.20* 17.11*  --  8.95*    Assessment: 51yo male remains subtherapeutic on heparin with very little change in level after rate increase.  Goal of Therapy:  Heparin level 0.3-0.7 units/ml   Plan:  Will rebolus with heparin 2000 units IV and increase gtt by 3 units/kg/hr to 1800 units/hr and check level in 6hr.  Wynona Neat, PharmD, BCPS  03/14/2017,4:03 AM

## 2017-03-14 NOTE — Progress Notes (Signed)
Pt refused vitals

## 2017-03-14 NOTE — Progress Notes (Signed)
[facility] Progress Note  Subjective: no c/o's, very good mood  Vitals:   03/13/17 1957 03/13/17 2100 03/14/17 0659 03/14/17 1100  BP: (!) 188/104  (!) 167/100 (!) 150/108  Pulse: 86 89 80   Resp:  18 18   Temp:  98.6 F (37 C) 98.5 F (36.9 C)   TempSrc:  Oral Oral   SpO2:   100%   Weight:  71.4 kg (157 lb 8 oz)    Height:  6' 2" (1.88 m)      Inpatient medications: . amLODipine  10 mg Oral QHS  . calcitRIOL  3.5 mcg Oral Q M,W,F-HD  . cinacalcet  90 mg Oral Q supper  . cloNIDine  0.3 mg Transdermal Weekly  . hydrALAZINE  100 mg Oral BID  . isosorbide mononitrate  60 mg Oral Daily  . pantoprazole  40 mg Oral BID AC  . sevelamer carbonate  2,400 mg Oral TID WC  . Warfarin - Pharmacist Dosing Inpatient   Does not apply q1800   . heparin 1,800 Units/hr (03/14/17 0413)   hydrALAZINE, metoCLOPramide, ondansetron **OR** ondansetron (ZOFRAN) IV, oxyCODONE, sevelamer carbonate  Exam: General: Well appearing AA male in no acute distress. Lungs: Clear bilaterally to auscultation without wheezes, rales, or rhonchi. Breathing is unlabored. Heart: RRR with S1 S2. No murmurs, rubs, or gallops appreciated. Abdomen: Soft, non-tender, non-distended with normoactive bowel sounds. No rebound/guarding. No obvious abdominal masses. Lower extremities:without edema or ischemic changes, no open wounds  Neuro: Alert and oriented X 3. Moves all extremities spontaneously. Dialysis Access: LUA AVF + bruit  Dialysis Orders: [facility] MWF 4 hrs 180 NRe 400/Auto 200 71 kg 2.0K/2.25 Ca  -Heparin 2000 units IV TIW -Mircera 200 mcg IV (recently resumed-no dose yet Last HGB 9.2 02/26/17) -Calcitriol 3.5 mcg PO TIW (last PTH 1462 02/11/17)  BMD Meds: Renvela 800 mg 3 tabs PO TID AC and 2 tabs PO BID Sensipar 90 mg PO Q HS (Last Phos 6.4 Ca 8.4 C Ca 8.4)   Assessment: 1.  RUA DVT: On heparin and coumadin.  Hx of prior DVT's in the years past. Pt will need PCP to follow  coumadin/ INR, we can draw the INR's at HD.   2. Hyperkalemia: better 3.  ESRD - MWF 4.  Hypertension/volume: BP's stable on 3 bp meds + Imdur.  AT dry wt today 5.  Anemia  -HGB 7.8. No Recent ESA. Give Aranesp 200 mcg IV today. Follow HGB  6.  Metabolic bone disease -  Resume BMD meds.  7.  Nutrition -Renal diet, renal vit, nepro. 8. H/O combined systolic/diastolic HF-monitor volume.  62. DIspo - per primary team, stable from renal standpoint   Plan - HD today, min UF.     Kelly Splinter MD [facility] pager [phone]   03/14/2017, 11:47 AM    Recent Labs Lab 03/13/17 0052 03/13/17 0247 03/13/17 1548 03/14/17 0304  NA 140 138 138 136  K 5.7* 6.1* 6.0* 4.6  CL 99* 101 99* 97*  CO2 24  --  24 26  GLUCOSE 77 85 92 76  BUN 78* 74* 83* 30*  CREATININE 16.08* 17.20* 17.11* 8.95*  CALCIUM 7.8*  --  7.9* 7.8*  PHOS  --   --  6.0*  --     Recent Labs Lab 03/10/17 0438 03/13/17 1548  AST 25  --   ALT 13*  --   ALKPHOS 116  --   BILITOT 0.7  --   PROT 6.9  --  ALBUMIN 2.8* 3.0*    Recent Labs Lab 03/10/17 0438 03/13/17 0052 03/13/17 0247 03/13/17 1548 03/14/17 0304  WBC 5.7 7.1  --  5.2 5.6  NEUTROABS 3.7 4.9  --   --   --   HGB 8.1* 4.8* 7.8* 8.3* 8.4*  HCT 26.1* 14.7* 23.0* 26.8* 25.5*  MCV 86.4 84.5  --  84.5 85.3  PLT 211 261  --  246 195   Iron/TIBC/Ferritin/ %Sat    Component Value Date/Time   IRON 41 (L) 05/29/2016 1350   TIBC 190 (L) 05/29/2016 1350   FERRITIN 328 11/02/2015 0346   IRONPCTSAT 22 05/29/2016 1350

## 2017-03-14 NOTE — Care Management Note (Addendum)
Case Management Note  Patient Details  Name: Frank [name] MRN: [id number] Date of Birth: [birth year]  Subjective/Objective:    CM following for progression and d/c planning.                 Action/Plan: 03/14/2017 Pt in need of PCP to receive INR report from HD center to monitor coumadin. Per chart this pt has been a pt at [facility] in the past. Will contact that center re monitoring the INR and dosing Coumadin.  PCP reported by pt on demographic sheet is actually a nephrologist in [facility].  Will need to arrange with [facility] center on Monday, March 17, 2017. Per Dr Jonnie Finner this pt INR can be drawn at his HD Center.    Expected Discharge Date:                  Expected Discharge Plan:  Home/Self Care  In-House Referral:  NA  Discharge planning Services  CM Consult, [facility]  Post Acute Care Choice:    Choice offered to:     DME Arranged:    DME Agency:     HH Arranged:    HH Agency:     Status of Service:  In process, will continue to follow  If discussed at Long Length of Stay Meetings, dates discussed:    Additional Comments:  Adron Bene, RN 03/14/2017, 5:28 PM

## 2017-03-15 LAB — CULTURE, BLOOD (ROUTINE X 2)
Culture: NO GROWTH
Special Requests: ADEQUATE

## 2017-03-15 NOTE — Progress Notes (Signed)
Pt refused vitals 

## 2017-03-15 NOTE — Progress Notes (Signed)
On my initial rounding,he was looking for his docotor to be able to get home early becouse of his work.Nurses explained to him that it is not possible for him to go home early coz there is no lab work result yet,to check if he is therepeutic on his blood thinner.Patient insist that he is going home no matter what.M.D. Made aware.

## 2017-03-15 NOTE — Discharge Summary (Signed)
Physician Discharge Summary  Frank [name] ORV:[id number] DOB: 01/12/1966 DOA: 03/13/2017  PCP: Frank Patella, MD  Admit date: 03/13/2017 Discharge date: 03/15/2017  Time spent: 20 minutes  Recommendations for Outpatient Follow-up:  Left AGAINST MEDICAL ADVICE!  Discharge Diagnoses:  Right Upper Extremity DVT ESRD Hyperkalemia Anemia of Chronic Disease Essential hypertension GERD Chronic systolic CHF  Discharge Condition: not stable, left AMA  Diet recommendation: None  Filed Weights   03/13/17 1935 03/13/17 2100 03/14/17 1424  Weight: 71.6 kg (157 lb 13.6 oz) 71.4 kg (157 lb 8 oz) 72 kg (158 lb 11.7 oz)    History of present illness:  on 03/13/2017 by Dr. Harrington Challenger D [name] a 51 y.o.malewith medical history significant of ESRD with dialysis MWF. Missed dialysis yesterday. Patient recently diagnosed with RUE DVT. Nephrology recommended hospital admission for heparin bridge to coumadin when he was seen in ED on the 20th. 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 He has come back today for admission.  Hospital Course:  Right Upper Extremity DVT -Right upper extremity Doppler on 03/11/2017 showed DVT involving axillary vein, chronic DVT of the axillary vein. Non-determinant DVT of the brachial vein in the right upper extremity. Superficial acute thrombosis of the right basilic and cephalic veins. -Patient placed on Coumadin, bridgingwith heparin -Per patient, this is his second blood clot. Patient states he's been off of Coumadin for a year. -INR 1.30 on 8/24 -On 8/24: Patient stated that he will stay until 03/15/2017, but will likely leave [place]. Explained to patient the risks of leaving before being completely and appropriately bridged with heparin- can lead to further blood clots. Explained to patient that he will likely NOT be therapeutic by 03/15/17. -No labs were available this morning -patient was insistent on leaving  before examination or labs were drawn -RN reiterated the risks of leaving before therapeutic INR. Per documentation from RN, patient has coumadin at home, and will have his INR checked at dialysis.   ESRD -Patient dialyzes MWF -Nephrology consulted and appreciated -Patient missed dialysis on 03/12/2017 -Patient dialyzed 03/13/2017  Hyperkalemia -Likely secondary to missed hemodialysis  Anemia of Chronic Disease -Baseline hemoglobin approximately 10 -Hemoglobin currently 8.4 -FOBT negative  Essential hypertension -Continue amlodipine, clonidine, hydralazine, imdur  GERD -Continue PPI  Chronic systolic CHF -Echocardiogram 10/31/2015 EF 35-40%  -Currently appears euvolemic and compensated -Continue volume control with HD   Procedures: None  Consultations: Nephrology  Discharge Exam: Vitals:   03/14/17 1755 03/15/17 0842  BP: (!) 163/89 (!) 148/70  Pulse: 82 69  Resp: 20 20  Temp:  98.7 F (37.1 C)  SpO2: 100% 96%   NO PHYSICAL EXAM AS PATIENT LEFT AGAINST MEDICAL ADVICE!  Discharge Instructions  Discharge Medication List as of 03/15/2017  9:25 AM    CONTINUE these medications which have NOT CHANGED   Details  amLODipine (NORVASC) 10 MG tablet Take 1 tablet (10 mg total) by mouth at bedtime., Starting Sun 12/01/2016, Normal    cloNIDine (CATAPRES - DOSED IN MG/24 HR) 0.3 mg/24hr patch Place 0.3 mg onto the skin once a week. On Wednesday , Starting Tue 12/31/2016, Historical Med    hydrALAZINE (APRESOLINE) 100 MG tablet Take 1 tablet (100 mg total) by mouth 2 (two) times daily., Starting Tue 01/07/2017, Normal    isosorbide mononitrate (IMDUR) 60 MG 24 hr tablet Take 1 tablet (60 mg total) by mouth daily., Starting Wed 01/08/2017, Normal    metoCLOPramide (REGLAN) 5 MG tablet Take 1  tablet (5 mg total) by mouth every 8 (eight) hours as needed for nausea., Starting Sun 12/01/2016, Normal    pantoprazole (PROTONIX) 40 MG tablet Take 1 tablet (40 mg total) by  mouth 2 (two) times daily before a meal., Starting Sun 12/01/2016, Normal    sevelamer carbonate (RENVELA) 800 MG tablet Take 1 tablet (800 mg total) by mouth 3 (three) times daily with meals., Starting Mon 01/06/2017, Normal    traMADol (ULTRAM) 50 MG tablet Take 1 tablet (50 mg total) by mouth every 12 (twelve) hours as needed for moderate pain or severe pain., Starting Sun 12/01/2016, Print       Allergies  Allergen Reactions  . Butalbital-Apap-Caffeine Shortness Of Breath, Swelling and Other (See Comments)    Swelling in throat  . Ferrlecit [Na Ferric Gluc Cplx In Sucrose] Shortness Of Breath, Swelling and Other (See Comments)    Swelling in throat, tolerates Venofor  . Minoxidil Shortness Of Breath  . Tylenol [Acetaminophen] Anaphylaxis and Swelling  . Darvocet [Propoxyphene N-Acetaminophen] Hives      The results of significant diagnostics from this hospitalization (including imaging, microbiology, ancillary and laboratory) are listed below for reference.    Significant Diagnostic Studies: Dg Chest 2 View  Result Date: 03/02/2017 CLINICAL DATA:  Epigastric pain and dyspnea EXAM: CHEST  2 VIEW COMPARISON:  Chest CT 08/06/2016 and CXR 01/25/2017 FINDINGS: Stable cardiomegaly with aortic atherosclerosis. Mild interstitial opacities favored to represent interstitial edema. Tiny nodular densities in the left upper lobe are favored to represent branch points for pulmonary vessels. No pulmonary nodules are seen on recent CT. No pneumonic consolidation, effusion or pneumothorax. No acute nor suspicious osseous abnormalities. IMPRESSION: Stable cardiomegaly with aortic atherosclerosis and probable mild interstitial edema. Two tiny nodular densities in left upper lobe likely to reflect branch points for pulmonary vessels. Electronically Signed   By: Ashley Royalty M.D.   On: 03/02/2017 01:21   Dg Forearm Right  Result Date: 03/10/2017 CLINICAL DATA:  Right forearm pain, swelling, redness, or  bruising. EXAM: RIGHT FOREARM - 2 VIEW COMPARISON:  None. FINDINGS: There is no evidence of fracture or other focal bone lesions. Wrist and elbow alignment is maintained. Moderate olecranon spur. Dense vascular calcifications. Soft tissue edema noted about the volar forearm. No soft tissue air or radiopaque foreign body. IMPRESSION: Soft tissue edema.  No acute osseous abnormality. Dense vascular calcifications. Electronically Signed   By: Jeb Levering M.D.   On: 03/10/2017 04:23   US Abdomen Limited Ruq  Result Date: 03/02/2017 CLINICAL DATA:  Acute onset of right upper quadrant abdominal pain, radiating to the back. Nausea and vomiting. Initial encounter. EXAM: ULTRASOUND ABDOMEN LIMITED RIGHT UPPER QUADRANT COMPARISON:  CT of the abdomen and pelvis performed 01/05/2017, and right upper quadrant ultrasound performed 10/04/2016 FINDINGS: Gallbladder: Gallbladder wall thickening is noted. Pericholecystic fluid is nonspecific in the presence of trace ascites. No definite stones are seen. Evaluation for ultrasonographic Murphy's sign is limited as the patient is on pain medication. Common bile duct: Diameter: 0.6 cm, within normal limits in caliber. Liver: No focal lesion identified. Mildly coarsened echotexture noted. Parenchymal echogenicity remains within normal limits. A small amount of fluid is seen tracking about the liver. IMPRESSION: 1. Gallbladder wall thickening noted. Pericholecystic fluid is nonspecific in the presence of trace ascites. This could reflect mild acute cholecystitis or chronic inflammation. No stones seen. 2. Trace ascites noted tracking about the liver. Electronically Signed   By: Garald Balding M.D.   On: 03/02/2017 05:05  Microbiology: Recent Results (from the past 240 hour(s))  Blood culture (routine x 2)     Status: None   Collection Time: 03/10/17  4:38 AM  Result Value Ref Range Status   Specimen Description BLOOD RIGHT HAND  Final   Special Requests   Final     BOTTLES DRAWN AEROBIC AND ANAEROBIC Blood Culture adequate volume   Culture NO GROWTH 5 DAYS  Final   Report Status 03/15/2017 FINAL  Final     Labs: Basic Metabolic Panel:  Recent Labs Lab 03/10/17 0438 03/13/17 0052 03/13/17 0247 03/13/17 1548 03/14/17 0304  NA 141 140 138 138 136  K 4.7 5.7* 6.1* 6.0* 4.6  CL 100* 99* 101 99* 97*  CO2 29 24  --  24 26  GLUCOSE 99 77 85 92 76  BUN 42* 78* 74* 83* 30*  CREATININE 10.70* 16.08* 17.20* 17.11* 8.95*  CALCIUM 7.8* 7.8*  --  7.9* 7.8*  PHOS  --   --   --  6.0*  --    Liver Function Tests:  Recent Labs Lab 03/10/17 0438 03/13/17 1548  AST 25  --   ALT 13*  --   ALKPHOS 116  --   BILITOT 0.7  --   PROT 6.9  --   ALBUMIN 2.8* 3.0*   No results for input(s): LIPASE, AMYLASE in the last 168 hours. No results for input(s): AMMONIA in the last 168 hours. CBC:  Recent Labs Lab 03/10/17 0438 03/13/17 0052 03/13/17 0247 03/13/17 1548 03/14/17 0304  WBC 5.7 7.1  --  5.2 5.6  NEUTROABS 3.7 4.9  --   --   --   HGB 8.1* 4.8* 7.8* 8.3* 8.4*  HCT 26.1* 14.7* 23.0* 26.8* 25.5*  MCV 86.4 84.5  --  84.5 85.3  PLT 211 261  --  246 195   Cardiac Enzymes: No results for input(s): CKTOTAL, CKMB, CKMBINDEX, TROPONINI in the last 168 hours. BNP: BNP (last 3 results) No results for input(s): BNP in the last 8760 hours.  ProBNP (last 3 results) No results for input(s): PROBNP in the last 8760 hours.  CBG: No results for input(s): GLUCAP in the last 168 hours.     SignedCristal Ford  [facility] 03/15/2017, 10:19 AM

## 2017-03-15 NOTE — Progress Notes (Signed)
Pt refused vitals, Rn Notified

## 2017-03-15 NOTE — Progress Notes (Signed)
Called into the patient's room.Patient insisting he's going home.Nurse explained to him the risk for him of going home knowing that he he was in a blood thinner plus how much Coumadin he needs to take ,for no blood test result as of this time.Patient said'' I 'll  Take the risk,i know I am doing .I have still some Coumadin at home to take with.I let the Dialysis people to take my labs".Patient signed AMA form.M.D made aware.

## 2017-03-16 DIAGNOSIS — I82621 Acute embolism and thrombosis of deep veins of right upper extremity: Secondary | ICD-10-CM | POA: Diagnosis not present

## 2017-03-16 DIAGNOSIS — Z888 Allergy status to other drugs, medicaments and biological substances status: Secondary | ICD-10-CM | POA: Diagnosis not present

## 2017-03-16 DIAGNOSIS — J811 Chronic pulmonary edema: Secondary | ICD-10-CM | POA: Diagnosis not present

## 2017-03-16 DIAGNOSIS — J810 Acute pulmonary edema: Secondary | ICD-10-CM | POA: Diagnosis not present

## 2017-03-16 DIAGNOSIS — N189 Chronic kidney disease, unspecified: Secondary | ICD-10-CM | POA: Diagnosis not present

## 2017-03-16 DIAGNOSIS — D631 Anemia in chronic kidney disease: Secondary | ICD-10-CM | POA: Diagnosis not present

## 2017-03-16 DIAGNOSIS — I129 Hypertensive chronic kidney disease with stage 1 through stage 4 chronic kidney disease, or unspecified chronic kidney disease: Secondary | ICD-10-CM | POA: Diagnosis not present

## 2017-03-16 DIAGNOSIS — E1122 Type 2 diabetes mellitus with diabetic chronic kidney disease: Secondary | ICD-10-CM | POA: Diagnosis not present

## 2017-03-16 DIAGNOSIS — R079 Chest pain, unspecified: Secondary | ICD-10-CM | POA: Diagnosis not present

## 2017-03-16 DIAGNOSIS — I517 Cardiomegaly: Secondary | ICD-10-CM | POA: Diagnosis not present

## 2017-03-16 DIAGNOSIS — Z7901 Long term (current) use of anticoagulants: Secondary | ICD-10-CM | POA: Diagnosis not present

## 2017-03-16 DIAGNOSIS — Z940 Kidney transplant status: Secondary | ICD-10-CM | POA: Diagnosis not present

## 2017-03-16 DIAGNOSIS — M79631 Pain in right forearm: Secondary | ICD-10-CM | POA: Diagnosis not present

## 2017-03-16 DIAGNOSIS — Z79899 Other long term (current) drug therapy: Secondary | ICD-10-CM | POA: Diagnosis not present

## 2017-03-16 DIAGNOSIS — Z992 Dependence on renal dialysis: Secondary | ICD-10-CM | POA: Diagnosis not present

## 2017-03-17 DIAGNOSIS — I517 Cardiomegaly: Secondary | ICD-10-CM | POA: Diagnosis not present

## 2017-03-17 DIAGNOSIS — R079 Chest pain, unspecified: Secondary | ICD-10-CM | POA: Diagnosis not present

## 2017-03-17 DIAGNOSIS — J811 Chronic pulmonary edema: Secondary | ICD-10-CM | POA: Diagnosis not present

## 2017-03-18 DIAGNOSIS — M79631 Pain in right forearm: Secondary | ICD-10-CM | POA: Diagnosis not present

## 2017-03-18 DIAGNOSIS — Z940 Kidney transplant status: Secondary | ICD-10-CM | POA: Diagnosis not present

## 2017-03-18 DIAGNOSIS — I120 Hypertensive chronic kidney disease with stage 5 chronic kidney disease or end stage renal disease: Secondary | ICD-10-CM | POA: Diagnosis not present

## 2017-03-18 DIAGNOSIS — R4182 Altered mental status, unspecified: Secondary | ICD-10-CM | POA: Diagnosis not present

## 2017-03-18 DIAGNOSIS — Z7901 Long term (current) use of anticoagulants: Secondary | ICD-10-CM | POA: Diagnosis not present

## 2017-03-18 DIAGNOSIS — N189 Chronic kidney disease, unspecified: Secondary | ICD-10-CM | POA: Diagnosis not present

## 2017-03-18 DIAGNOSIS — E1122 Type 2 diabetes mellitus with diabetic chronic kidney disease: Secondary | ICD-10-CM | POA: Diagnosis not present

## 2017-03-18 DIAGNOSIS — R001 Bradycardia, unspecified: Secondary | ICD-10-CM | POA: Diagnosis not present

## 2017-03-18 DIAGNOSIS — R1110 Vomiting, unspecified: Secondary | ICD-10-CM | POA: Diagnosis not present

## 2017-03-18 DIAGNOSIS — R531 Weakness: Secondary | ICD-10-CM | POA: Diagnosis not present

## 2017-03-18 DIAGNOSIS — Z992 Dependence on renal dialysis: Secondary | ICD-10-CM | POA: Diagnosis not present

## 2017-03-18 DIAGNOSIS — N186 End stage renal disease: Secondary | ICD-10-CM | POA: Diagnosis not present

## 2017-03-18 DIAGNOSIS — M7989 Other specified soft tissue disorders: Secondary | ICD-10-CM | POA: Diagnosis not present

## 2017-03-18 DIAGNOSIS — Z79899 Other long term (current) drug therapy: Secondary | ICD-10-CM | POA: Diagnosis not present

## 2017-03-18 DIAGNOSIS — I129 Hypertensive chronic kidney disease with stage 1 through stage 4 chronic kidney disease, or unspecified chronic kidney disease: Secondary | ICD-10-CM | POA: Diagnosis not present

## 2017-03-18 DIAGNOSIS — Z888 Allergy status to other drugs, medicaments and biological substances status: Secondary | ICD-10-CM | POA: Diagnosis not present

## 2017-03-18 DIAGNOSIS — R918 Other nonspecific abnormal finding of lung field: Secondary | ICD-10-CM | POA: Diagnosis not present

## 2017-03-18 DIAGNOSIS — E875 Hyperkalemia: Secondary | ICD-10-CM | POA: Diagnosis not present

## 2017-03-19 DIAGNOSIS — G8929 Other chronic pain: Secondary | ICD-10-CM | POA: Diagnosis not present

## 2017-03-19 DIAGNOSIS — R4182 Altered mental status, unspecified: Secondary | ICD-10-CM | POA: Diagnosis not present

## 2017-03-19 DIAGNOSIS — N189 Chronic kidney disease, unspecified: Secondary | ICD-10-CM | POA: Diagnosis not present

## 2017-03-19 DIAGNOSIS — Z794 Long term (current) use of insulin: Secondary | ICD-10-CM | POA: Diagnosis not present

## 2017-03-19 DIAGNOSIS — R9431 Abnormal electrocardiogram [ECG] [EKG]: Secondary | ICD-10-CM | POA: Diagnosis not present

## 2017-03-19 DIAGNOSIS — I509 Heart failure, unspecified: Secondary | ICD-10-CM | POA: Diagnosis present

## 2017-03-19 DIAGNOSIS — I82409 Acute embolism and thrombosis of unspecified deep veins of unspecified lower extremity: Secondary | ICD-10-CM | POA: Diagnosis not present

## 2017-03-19 DIAGNOSIS — G4739 Other sleep apnea: Secondary | ICD-10-CM | POA: Diagnosis not present

## 2017-03-19 DIAGNOSIS — E8770 Fluid overload, unspecified: Secondary | ICD-10-CM | POA: Diagnosis not present

## 2017-03-19 DIAGNOSIS — G4733 Obstructive sleep apnea (adult) (pediatric): Secondary | ICD-10-CM | POA: Diagnosis present

## 2017-03-19 DIAGNOSIS — E11649 Type 2 diabetes mellitus with hypoglycemia without coma: Secondary | ICD-10-CM | POA: Diagnosis not present

## 2017-03-19 DIAGNOSIS — I4581 Long QT syndrome: Secondary | ICD-10-CM | POA: Diagnosis present

## 2017-03-19 DIAGNOSIS — Z86711 Personal history of pulmonary embolism: Secondary | ICD-10-CM | POA: Diagnosis not present

## 2017-03-19 DIAGNOSIS — N186 End stage renal disease: Secondary | ICD-10-CM | POA: Diagnosis not present

## 2017-03-19 DIAGNOSIS — I82621 Acute embolism and thrombosis of deep veins of right upper extremity: Secondary | ICD-10-CM | POA: Diagnosis present

## 2017-03-19 DIAGNOSIS — E875 Hyperkalemia: Secondary | ICD-10-CM | POA: Diagnosis not present

## 2017-03-19 DIAGNOSIS — E1122 Type 2 diabetes mellitus with diabetic chronic kidney disease: Secondary | ICD-10-CM | POA: Diagnosis present

## 2017-03-19 DIAGNOSIS — I499 Cardiac arrhythmia, unspecified: Secondary | ICD-10-CM | POA: Diagnosis not present

## 2017-03-19 DIAGNOSIS — Z7901 Long term (current) use of anticoagulants: Secondary | ICD-10-CM | POA: Diagnosis not present

## 2017-03-19 DIAGNOSIS — I160 Hypertensive urgency: Secondary | ICD-10-CM | POA: Diagnosis present

## 2017-03-19 DIAGNOSIS — I120 Hypertensive chronic kidney disease with stage 5 chronic kidney disease or end stage renal disease: Secondary | ICD-10-CM | POA: Diagnosis not present

## 2017-03-19 DIAGNOSIS — I517 Cardiomegaly: Secondary | ICD-10-CM | POA: Diagnosis not present

## 2017-03-19 DIAGNOSIS — R918 Other nonspecific abnormal finding of lung field: Secondary | ICD-10-CM | POA: Diagnosis not present

## 2017-03-19 DIAGNOSIS — Z9115 Patient's noncompliance with renal dialysis: Secondary | ICD-10-CM | POA: Diagnosis not present

## 2017-03-19 DIAGNOSIS — Z7952 Long term (current) use of systemic steroids: Secondary | ICD-10-CM | POA: Diagnosis not present

## 2017-03-19 DIAGNOSIS — Z9114 Patient's other noncompliance with medication regimen: Secondary | ICD-10-CM | POA: Diagnosis not present

## 2017-03-19 DIAGNOSIS — Z992 Dependence on renal dialysis: Secondary | ICD-10-CM | POA: Diagnosis not present

## 2017-03-19 DIAGNOSIS — I129 Hypertensive chronic kidney disease with stage 1 through stage 4 chronic kidney disease, or unspecified chronic kidney disease: Secondary | ICD-10-CM | POA: Diagnosis not present

## 2017-03-19 DIAGNOSIS — R001 Bradycardia, unspecified: Secondary | ICD-10-CM | POA: Diagnosis not present

## 2017-03-19 DIAGNOSIS — D509 Iron deficiency anemia, unspecified: Secondary | ICD-10-CM | POA: Diagnosis present

## 2017-03-19 DIAGNOSIS — T8611 Kidney transplant rejection: Secondary | ICD-10-CM | POA: Diagnosis not present

## 2017-03-19 DIAGNOSIS — E876 Hypokalemia: Secondary | ICD-10-CM | POA: Diagnosis not present

## 2017-03-19 DIAGNOSIS — Z79899 Other long term (current) drug therapy: Secondary | ICD-10-CM | POA: Diagnosis not present

## 2017-03-19 DIAGNOSIS — N2581 Secondary hyperparathyroidism of renal origin: Secondary | ICD-10-CM | POA: Diagnosis not present

## 2017-03-19 DIAGNOSIS — I5031 Acute diastolic (congestive) heart failure: Secondary | ICD-10-CM | POA: Diagnosis not present

## 2017-03-19 DIAGNOSIS — D638 Anemia in other chronic diseases classified elsewhere: Secondary | ICD-10-CM | POA: Diagnosis present

## 2017-03-19 DIAGNOSIS — E871 Hypo-osmolality and hyponatremia: Secondary | ICD-10-CM | POA: Diagnosis not present

## 2017-03-19 DIAGNOSIS — D631 Anemia in chronic kidney disease: Secondary | ICD-10-CM | POA: Diagnosis not present

## 2017-03-19 DIAGNOSIS — Z86718 Personal history of other venous thrombosis and embolism: Secondary | ICD-10-CM | POA: Diagnosis not present

## 2017-03-19 DIAGNOSIS — I132 Hypertensive heart and chronic kidney disease with heart failure and with stage 5 chronic kidney disease, or end stage renal disease: Secondary | ICD-10-CM | POA: Diagnosis not present

## 2017-03-19 DIAGNOSIS — N179 Acute kidney failure, unspecified: Secondary | ICD-10-CM | POA: Diagnosis not present

## 2017-03-19 DIAGNOSIS — K219 Gastro-esophageal reflux disease without esophagitis: Secondary | ICD-10-CM | POA: Diagnosis present

## 2017-03-21 DIAGNOSIS — Z992 Dependence on renal dialysis: Secondary | ICD-10-CM | POA: Diagnosis not present

## 2017-03-21 DIAGNOSIS — T8610 Unspecified complication of kidney transplant: Secondary | ICD-10-CM | POA: Diagnosis not present

## 2017-03-21 DIAGNOSIS — N186 End stage renal disease: Secondary | ICD-10-CM | POA: Diagnosis not present

## 2017-03-22 DIAGNOSIS — N2581 Secondary hyperparathyroidism of renal origin: Secondary | ICD-10-CM | POA: Diagnosis not present

## 2017-03-22 DIAGNOSIS — D631 Anemia in chronic kidney disease: Secondary | ICD-10-CM | POA: Diagnosis not present

## 2017-03-22 DIAGNOSIS — N186 End stage renal disease: Secondary | ICD-10-CM | POA: Diagnosis not present

## 2017-03-22 IMAGING — DX DG CHEST 2V
2 series · 2 of 2 positions shown · non-contrast
Comparison: October 07, 2014.

CLINICAL DATA: Weakness.

EXAM:
CHEST  2 VIEW

[chest lat]
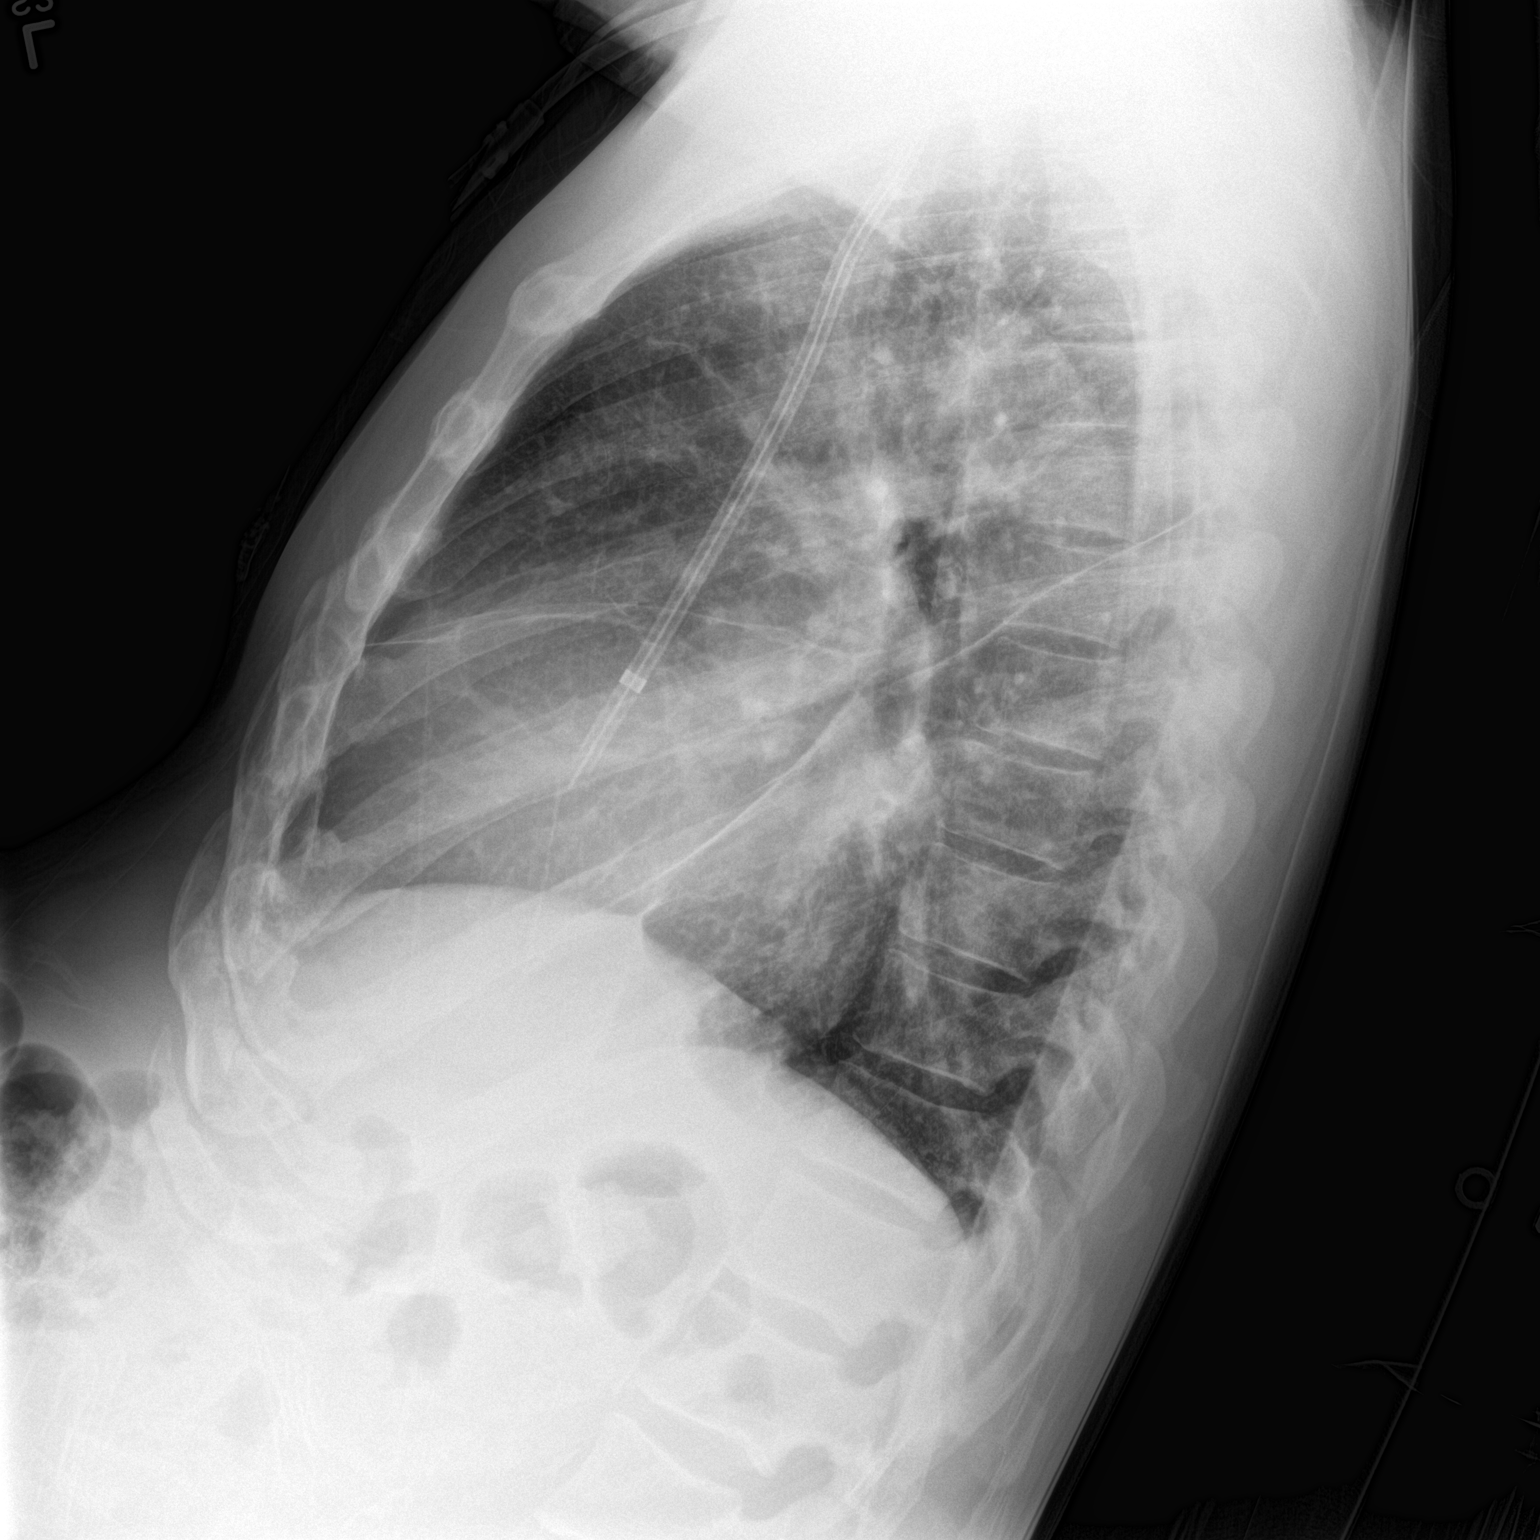

[chest ap]
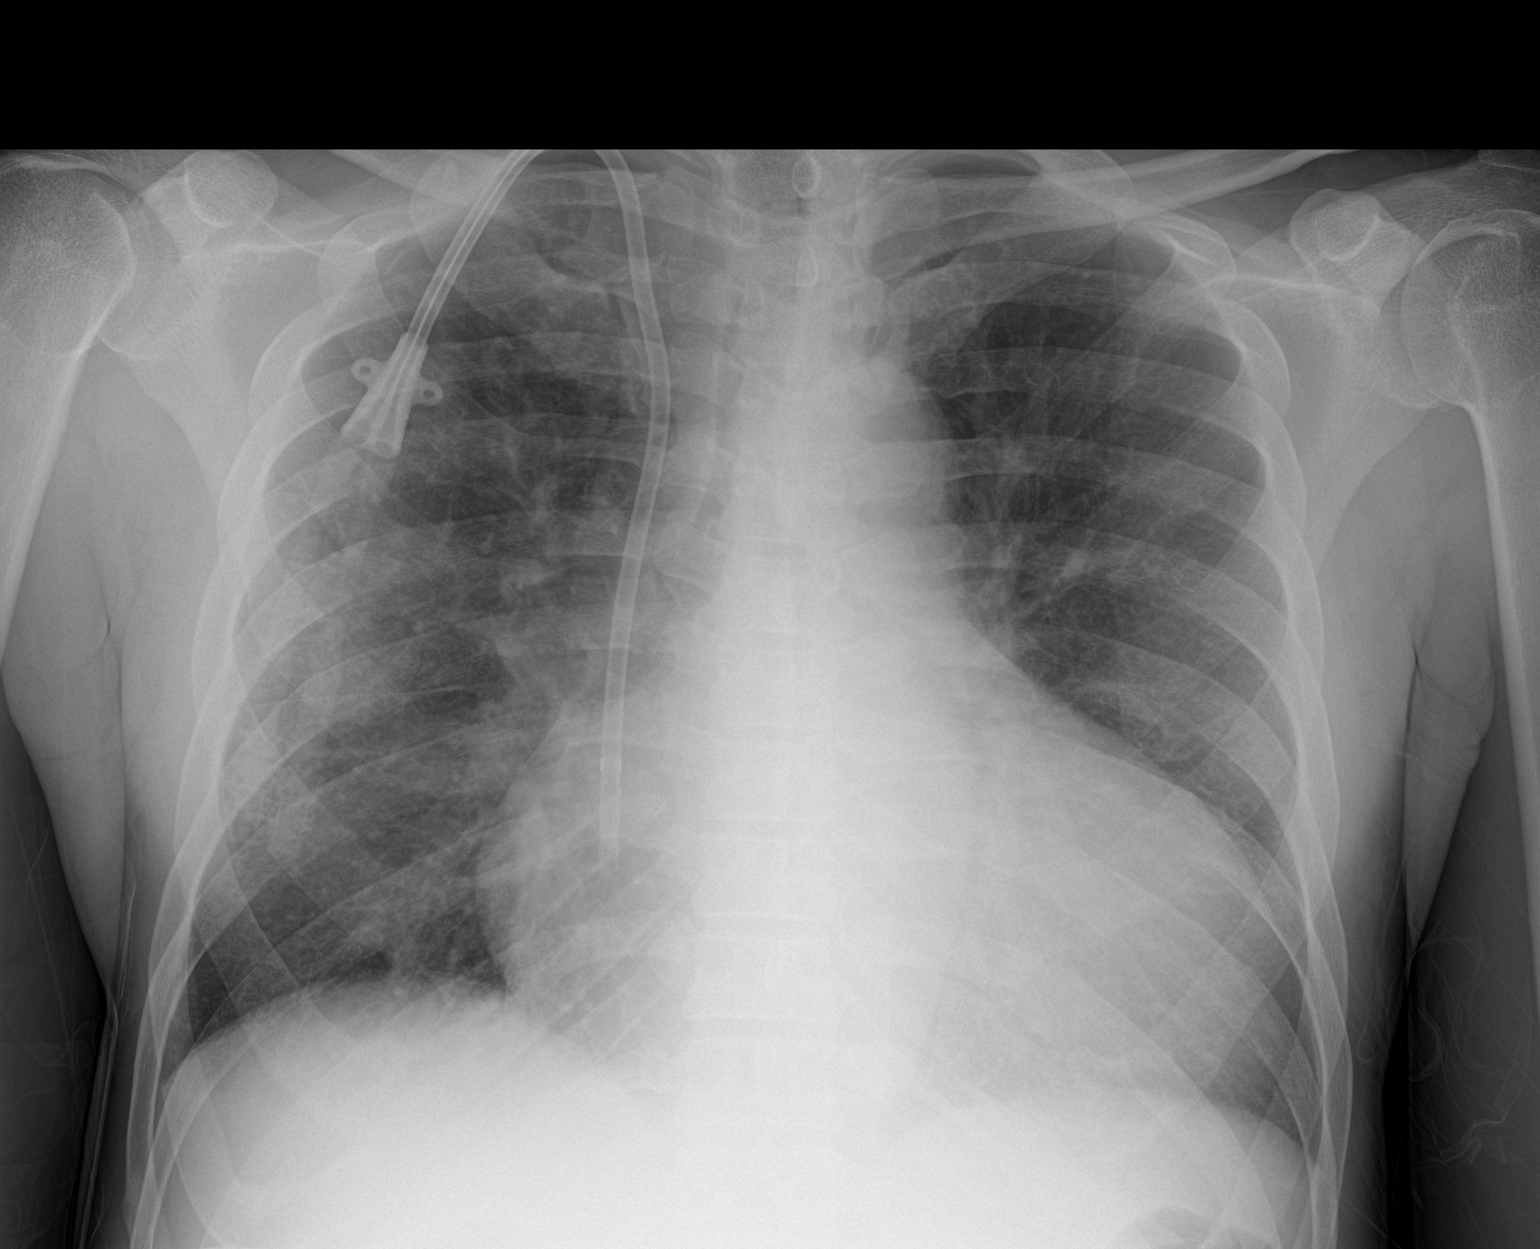

[2 of 2 positions shown; findings below may reference images not displayed]

FINDINGS: Stable cardiomegaly. No pneumothorax or pleural effusion is noted.
Right internal jugular dialysis catheter is unchanged in position.
No acute pulmonary disease is noted. Bony thorax is intact.
IMPRESSION: No acute cardiopulmonary abnormality seen.

## 2017-03-23 ENCOUNTER — Encounter (HOSPITAL_COMMUNITY): Payer: Self-pay | Admitting: Emergency Medicine

## 2017-03-23 ENCOUNTER — Emergency Department (HOSPITAL_COMMUNITY)
Admission: EM | Admit: 2017-03-23 | Discharge: 2017-03-23 | Disposition: A | Discharge status: Home / Self Care | Payer: Medicare Other | Attending: Emergency Medicine | Admitting: Emergency Medicine

## 2017-03-23 DIAGNOSIS — I82621 Acute embolism and thrombosis of deep veins of right upper extremity: Secondary | ICD-10-CM | POA: Diagnosis not present

## 2017-03-23 DIAGNOSIS — Z7901 Long term (current) use of anticoagulants: Secondary | ICD-10-CM | POA: Insufficient documentation

## 2017-03-23 DIAGNOSIS — I132 Hypertensive heart and chronic kidney disease with heart failure and with stage 5 chronic kidney disease, or end stage renal disease: Secondary | ICD-10-CM | POA: Diagnosis not present

## 2017-03-23 DIAGNOSIS — Z5181 Encounter for therapeutic drug level monitoring: Secondary | ICD-10-CM

## 2017-03-23 DIAGNOSIS — I5042 Chronic combined systolic (congestive) and diastolic (congestive) heart failure: Secondary | ICD-10-CM | POA: Insufficient documentation

## 2017-03-23 DIAGNOSIS — Z79899 Other long term (current) drug therapy: Secondary | ICD-10-CM | POA: Diagnosis not present

## 2017-03-23 DIAGNOSIS — E1122 Type 2 diabetes mellitus with diabetic chronic kidney disease: Secondary | ICD-10-CM | POA: Diagnosis not present

## 2017-03-23 DIAGNOSIS — R112 Nausea with vomiting, unspecified: Secondary | ICD-10-CM

## 2017-03-23 DIAGNOSIS — N186 End stage renal disease: Secondary | ICD-10-CM | POA: Diagnosis not present

## 2017-03-23 DIAGNOSIS — R791 Abnormal coagulation profile: Secondary | ICD-10-CM | POA: Diagnosis not present

## 2017-03-23 DIAGNOSIS — Z992 Dependence on renal dialysis: Secondary | ICD-10-CM | POA: Diagnosis not present

## 2017-03-23 DIAGNOSIS — Z87891 Personal history of nicotine dependence: Secondary | ICD-10-CM | POA: Insufficient documentation

## 2017-03-23 LAB — COMPREHENSIVE METABOLIC PANEL
ALBUMIN: 3 g/dL — AB (ref 3.5–5.0)
ALT: 17 U/L (ref 17–63)
ANION GAP: 13 (ref 5–15)
AST: 27 U/L (ref 15–41)
Alkaline Phosphatase: 163 U/L — ABNORMAL HIGH (ref 38–126)
BILIRUBIN TOTAL: 0.5 mg/dL (ref 0.3–1.2)
BUN: 27 mg/dL — ABNORMAL HIGH (ref 6–20)
CO2: 27 mmol/L (ref 22–32)
Calcium: 8.5 mg/dL — ABNORMAL LOW (ref 8.9–10.3)
Chloride: 101 mmol/L (ref 101–111)
Creatinine, Ser: 6.19 mg/dL — ABNORMAL HIGH (ref 0.61–1.24)
GFR calc Af Amer: 11 mL/min — ABNORMAL LOW (ref >60)
GFR calc non Af Amer: 9 mL/min — ABNORMAL LOW (ref >60)
Glucose, Bld: 70 mg/dL (ref 65–99)
POTASSIUM: 4.3 mmol/L (ref 3.5–5.1)
SODIUM: 141 mmol/L (ref 135–145)
TOTAL PROTEIN: 7.3 g/dL (ref 6.5–8.1)

## 2017-03-23 LAB — CBC
HEMATOCRIT: 26.4 % — AB (ref 39.0–52.0)
HEMOGLOBIN: 8.5 g/dL — AB (ref 13.0–17.0)
MCH: 27.4 pg (ref 26.0–34.0)
MCHC: 32.2 g/dL (ref 30.0–36.0)
MCV: 85.2 fL (ref 78.0–100.0)
Platelets: 207 10*3/uL (ref 150–400)
RBC: 3.1 MIL/uL — ABNORMAL LOW (ref 4.22–5.81)
RDW: 18.4 % — ABNORMAL HIGH (ref 11.5–15.5)
WBC: 5.3 10*3/uL (ref 4.0–10.5)

## 2017-03-23 LAB — PROTIME-INR
INR: 1.15
Prothrombin Time: 14.6 seconds (ref 11.4–15.2)

## 2017-03-23 LAB — LIPASE, BLOOD: Lipase: 52 U/L — ABNORMAL HIGH (ref 11–51)

## 2017-03-23 MED ORDER — WARFARIN SODIUM 5 MG PO TABS: ORAL_TABLET | ORAL | 0 refills | Status: DC

## 2017-03-23 MED ORDER — ENOXAPARIN SODIUM 60 MG/0.6ML ~~LOC~~ SOLN: 60.0000 mg | SUBCUTANEOUS | 0 refills | Status: DC

## 2017-03-23 MED ORDER — WARFARIN SODIUM 7.5 MG PO TABS
7.5000 mg | ORAL_TABLET | Freq: Once | ORAL | Status: AC
  Administered 2017-03-23: 7.5 mg via ORAL
  Filled 2017-03-23: qty 1

## 2017-03-23 MED ORDER — ONDANSETRON 4 MG PO TBDP: 4.0000 mg | ORAL_TABLET | Freq: Three times a day (TID) | ORAL | 0 refills | Status: AC | PRN

## 2017-03-23 MED ORDER — AMLODIPINE BESYLATE 5 MG PO TABS
10.0000 mg | ORAL_TABLET | Freq: Once | ORAL | Status: AC
  Administered 2017-03-23: 10 mg via ORAL
  Filled 2017-03-23: qty 2

## 2017-03-23 MED ORDER — ENOXAPARIN SODIUM 80 MG/0.8ML ~~LOC~~ SOLN
1.0000 mg/kg | Freq: Once | SUBCUTANEOUS | Status: AC
  Administered 2017-03-23: 70 mg via SUBCUTANEOUS
  Filled 2017-03-23: qty 0.8

## 2017-03-23 MED ORDER — HYDRALAZINE HCL 25 MG PO TABS
100.0000 mg | ORAL_TABLET | Freq: Once | ORAL | Status: AC
  Administered 2017-03-23: 100 mg via ORAL
  Filled 2017-03-23: qty 4

## 2017-03-23 MED ORDER — ONDANSETRON 4 MG PO TBDP
4.0000 mg | ORAL_TABLET | Freq: Once | ORAL | Status: AC
  Administered 2017-03-23: 4 mg via ORAL
  Filled 2017-03-23: qty 1

## 2017-03-23 NOTE — ED Notes (Signed)
Pt given PO fluids. Tolerating fluids well. Showing NAD.

## 2017-03-23 NOTE — ED Triage Notes (Signed)
Patient arrives with complaint of vomiting and right arm pain. States he went to HD today and was able to complete treatment, but they reported to him that his blood was clotting quickly and that is INR is too low. Patient reports upon arrival here that he has not been able to take his medications for 2-3 days as a result of the vomiting he has been experiencing.

## 2017-03-23 NOTE — ED Provider Notes (Signed)
[facility] DEPT Provider Note   CSN: [id number] Arrival date & time: 03/23/17  0053     History   Chief Complaint Chief Complaint  Patient presents with  . Emesis  . Arm Pain    HPI Frank [name] is a 51 y.o. male.  HPI 51 year old male with an extensive past medical history including ESRD on dialysis Tuesday Thursdays and Fridays, DVT recently diagnosed with a right upper extremity DVT and placed on Coumadin. Patient was admitted earlier in August but left AGAINST MEDICAL ADVICE prior to being able to be bridged appropriately to Coumadin. Following his discharge, the patient was admitted at [facility] where he was able to be bridged to Coumadin. He was discharged 2 days ago from [facility] and has not had any of his Coumadin since. He reports that when they discharged him, they gave him a prescription for pharmacy in [place] which she was unable to fill. Reported that he went to dialysis yesterday where they noted that his INR was subtherapeutic. Patient currently denies any worsening arm pain or swelling. Denies any chest pain, shortness of breath.  Patient does endorse several days of nausea and vomiting with epigastric abdominal discomfort. States that he is unable to tolerate any by mouth intake and has not been able to take his prescription medicine.  Denies any recent fevers or known infections. Denies any suspicious food intake. No known sick contacts.  Denies any other alleviating or aggravating factors. Denies any other physical complaints.  Past Medical History:  Diagnosis Date  . Anemia   . Anxiety   . Chronic combined systolic and diastolic CHF (congestive heart failure) (HCC)    a. EF 20-25% by echo in 08/2015 b. echo 10/2015: EF 35-40%, diffuse HK, severe LAE, moderate RAE, small pericardial effusion  . Complication of anesthesia    itching, sore throat  . Depression   . Dialysis patient ([place])   . DVT (deep venous thrombosis) ([place]) 02/2017  . ESRD (end stage renal  disease) ([place])    due to HTN per patient, followed at [facility], s/p failed kidney transplant - dialysis Tue, Th, Sat  . Hyperkalemia 12/2015  . Hypertension   . Junctional rhythm    a. noted in 08/2015: hyperkalemic at that time  b. 12/2015: presented in junctional rhythm w/ K+ of 6.6. Resolved with improvement of K+ levels.  . Nonischemic cardiomyopathy ([place])    a. 08/2014: cath showing minimal CAD, but tortuous arteries noted.   . Personal history of DVT (deep vein thrombosis)/ PE 05/26/2016   In Oct 2015 had small subsemental LUL PE w/o DVT (LE dopplers neg) and was felt to be HD cath related, treated w coumadin.  IN May 2016 had small vein DVT (acute/subacute) in the R basilic/ brachial veins of the RUE, resumed on coumadin.  Had R sided HD cath at that time.    . Renal insufficiency   . Shortness of breath   . Type II diabetes mellitus (HCC)    No history per patient, but remains under history as A1c would not be accurate given on dialysis    Patient Active Problem List   Diagnosis Date Noted  . Acute DVT (deep venous thrombosis) ([place]) 03/13/2017  . DVT (deep venous thrombosis) ([place]) 03/11/2017  . Anemia 02/24/2017  . Volume overload 01/13/2017  . Aortic atherosclerosis ([place]) 01/05/2017  . Abdominal pain 08/04/2016  . Uremia 06/07/2016  . Hyperkalemia 05/29/2016  . GERD (gastroesophageal reflux disease) 05/29/2016  . Personal history of DVT (deep  vein thrombosis)/ PE 05/26/2016  . Nonischemic cardiomyopathy ([place]) 01/09/2016  . Bilateral low back pain without sciatica   . Renal cyst, left 10/30/2015  . Constipation by delayed colonic transit 10/30/2015  . Acute on chronic combined systolic and diastolic congestive heart failure ([place]) 09/23/2015  . Chest pain 09/08/2015  . Adjustment disorder with mixed anxiety and depressed mood 08/20/2015  . Essential hypertension 01/02/2015  . Dyslipidemia   . Malignant hypertension 11/29/2014  . ESRD on hemodialysis ([place])   . Acute  pulmonary edema (HCC)   . DM (diabetes mellitus), type 2, uncontrolled, with renal complications ([place])   . Complex sleep apnea syndrome 05/05/2014  . Anemia of chronic kidney failure 06/24/2013  . Nausea and vomiting 06/24/2013    Past Surgical History:  Procedure Laterality Date  . CAPD INSERTION    . CAPD REMOVAL    . INGUINAL HERNIA REPAIR Right 02/14/2015   Procedure: REPAIR INCARCERATED RIGHT INGUINAL HERNIA;  Surgeon: [name], MD;  Location: [facility];  Service: General;  Laterality: Right;  . INSERTION OF DIALYSIS CATHETER Right 09/23/2015   Procedure: exchange of Right internal Dialysis Catheter.;  Surgeon: [name], MD;  Location: [facility];  Service: Vascular;  Laterality: Right;  . IR GENERIC HISTORICAL  07/16/2016   IR US GUIDE VASC ACCESS LEFT 07/16/2016 Corrie Mckusick, DO [facility]-INTERV RAD  . IR GENERIC HISTORICAL Left 07/16/2016   IR THROMBECTOMY AV FISTULA W/THROMBOLYSIS/PTA INC/SHUNT/IMG LEFT 07/16/2016 Corrie Mckusick, DO [facility]-INTER[facility]  . KIDNEY RECEIPIENT  2006   failed and started HD in March 2014  . LEFT HEART CATHETERIZATION WITH CORONARY ANGIOGRAM N/A 09/02/2014   Procedure: LEFT HEART CATHETERIZATION WITH CORONARY ANGIOGRAM;  Surgeon: [name], MD;  Location: [facility] CATH LAB;  Service: Cardiovascular;  Laterality: N/A;       Home Medications    Prior to Admission medications   Medication Sig Start Date End Date Taking? Authorizing Provider  amLODipine (NORVASC) 10 MG tablet Take 1 tablet (10 mg total) by mouth at bedtime. 12/01/16   Rice, Resa Miner, MD  cloNIDine (CATAPRES - DOSED IN MG/24 HR) 0.3 mg/24hr patch Place 0.3 mg onto the skin once a week. On Wednesday  12/31/16   [provider]  enoxaparin (LOVENOX) 60 MG/0.6ML injection Inject 0.6 mLs (60 mg total) into the skin daily. 03/23/17 03/25/17  Fatima Blank, MD  hydrALAZINE (APRESOLINE) 100 MG tablet Take 1 tablet (100 mg total) by mouth 2 (two) times daily. 01/07/17   Ledell Noss, MD    isosorbide mononitrate (IMDUR) 60 MG 24 hr tablet Take 1 tablet (60 mg total) by mouth daily. 01/08/17   Ledell Noss, MD  metoCLOPramide (REGLAN) 5 MG tablet Take 1 tablet (5 mg total) by mouth every 8 (eight) hours as needed for nausea. 12/01/16   Rice, Resa Miner, MD  ondansetron (ZOFRAN ODT) 4 MG disintegrating tablet Take 1 tablet (4 mg total) by mouth every 8 (eight) hours as needed for nausea or vomiting. 03/23/17 03/26/17  Fatima Blank, MD  pantoprazole (PROTONIX) 40 MG tablet Take 1 tablet (40 mg total) by mouth 2 (two) times daily before a meal. 12/01/16   Rice, Resa Miner, MD  sevelamer carbonate (RENVELA) 800 MG tablet Take 1 tablet (800 mg total) by mouth 3 (three) times daily with meals. Patient taking differently: Take 800 mg by mouth 3 (three) times daily with meals. 800 mg as needed with snack 01/06/17   Ledell Noss, MD  traMADol Veatrice Bourbon) 50 MG tablet Take  1 tablet (50 mg total) by mouth every 12 (twelve) hours as needed for moderate pain or severe pain. 12/01/16   Collier Salina, MD  warfarin (COUMADIN) 5 MG tablet Take 7.5 mg of Coumadin daily for the next 2 days. After that take 5 mg daily, unless the physicians at the dialysis center one to keep by mouth and a 7.5 mg. 03/23/17   Jeriah Skufca, Grayce Sessions, MD    Family History Family History  Problem Relation Age of Onset  . Hypertension Other     Social History Social History  Substance Use Topics  . Smoking status: Former Smoker    Packs/day: 0.00    Years: 1.00    Types: Cigarettes  . Smokeless tobacco: Never Used     Comment: quit Jan 2014  . Alcohol use No     Allergies   Butalbital-apap-caffeine; Ferrlecit [na ferric gluc cplx in sucrose]; Minoxidil; Tylenol [acetaminophen]; and Darvocet [propoxyphene n-acetaminophen]   Review of Systems Review of Systems All other systems are reviewed and are negative for acute change except as noted in the HPI   Physical Exam Updated Vital Signs BP (!) 180/116  (BP Location: Right Arm)   Pulse 95   Temp 98.4 F (36.9 C) (Oral)   Resp 18   SpO2 98%   Physical Exam  Constitutional: He is oriented to person, place, and time. He appears well-developed and well-nourished. No distress.  HENT:  Head: Normocephalic and atraumatic.  Nose: Nose normal.  Eyes: Pupils are equal, round, and reactive to light. Conjunctivae and EOM are normal. Right eye exhibits no discharge. Left eye exhibits no discharge. No scleral icterus.  Neck: Normal range of motion. Neck supple.  Cardiovascular: Normal rate and regular rhythm.  Exam reveals no gallop and no friction rub.   No murmur heard. Pulmonary/Chest: Effort normal and breath sounds normal. No stridor. No respiratory distress. He has no rales.  Abdominal: Soft. He exhibits no distension. Tenderness: mild discomfort. There is no rigidity, no rebound, no guarding, no CVA tenderness and negative Murphy's sign. No hernia.  Musculoskeletal: He exhibits no edema or tenderness.  Neurological: He is alert and oriented to person, place, and time.  Skin: Skin is warm and dry. No rash noted. He is not diaphoretic. No erythema.  Psychiatric: He has a normal mood and affect.  Vitals reviewed.    ED Treatments / Results  Labs (all labs ordered are listed, but only abnormal results are displayed) Labs Reviewed  LIPASE, BLOOD - Abnormal; Notable for the following:       Result Value   Lipase 52 (*)    All other components within normal limits  COMPREHENSIVE METABOLIC PANEL - Abnormal; Notable for the following:    BUN 27 (*)    Creatinine, Ser 6.19 (*)    Calcium 8.5 (*)    Albumin 3.0 (*)    Alkaline Phosphatase 163 (*)    GFR calc non Af Amer 9 (*)    GFR calc Af Amer 11 (*)    All other components within normal limits  CBC - Abnormal; Notable for the following:    RBC 3.10 (*)    Hemoglobin 8.5 (*)    HCT 26.4 (*)    RDW 18.4 (*)    All other components within normal limits  PROTIME-INR    EKG  EKG  Interpretation None       Radiology No results found.  Procedures Procedures (including critical care time)  Medications Ordered in ED  Medications  ondansetron (ZOFRAN-ODT) disintegrating tablet 4 mg (4 mg Oral Given 03/23/17 0408)  hydrALAZINE (APRESOLINE) tablet 100 mg (100 mg Oral Given 03/23/17 0408)  amLODipine (NORVASC) tablet 10 mg (10 mg Oral Given 03/23/17 0409)  enoxaparin (LOVENOX) injection 70 mg (70 mg Subcutaneous Given 03/23/17 0655)  warfarin (COUMADIN) tablet 7.5 mg (7.5 mg Oral Given 03/23/17 0659)     Initial Impression / Assessment and Plan / ED Course  I have reviewed the triage vital signs and the nursing notes.  Pertinent labs & imaging results that were available during my care of the patient were reviewed by me and considered in my medical decision making (see chart for details).     1. RUE DVT. Subtherapeutic INR. Patient will need to be bridged. We'll bridged with short course of Lovenox for the next several days. Discussed appropriate therapy with pharmacy, who recommended 2 days of Lovenox in 2 days of 7.5 mg of Coumadin until he is able to follow-up with the dialysis center for further adjustments.  2. Nausea/vomiting and abdominal discomfort Epigastric discomfort without evidence of peritonitis. Labs with mildly elevated lipase but not consistent with acute pancreatitis. No significant electrolyte derangements. Renal function is patient's baseline. Low suspicion for acute cholecystitis or other intra-abdominal inflammatory/infectious process. Patient treated symptomatically with nausea medicine and he was able to tolerate oral intake.  3.  Hypertension Chronic hypertension. Patient reports not being able to take his medication due to the nausea and vomiting. Currently asymptomatic. Labs without evidence of acute end organ damage. Given patient's home dose of nighttime medicine, which improved his hypertension.  The patient is safe for discharge with strict  return precautions.   Final Clinical Impressions(s) / ED Diagnoses   Final diagnoses:  Non-intractable vomiting with nausea, unspecified vomiting type  Acute deep vein thrombosis (DVT) of right upper extremity, unspecified vein (HCC)  Subtherapeutic anticoagulation   Disposition: Discharge  Condition: Good  I have discussed the results, Dx and Tx plan with the patient who expressed understanding and agree(s) with the plan. Discharge instructions discussed at great length. The patient was given strict return precautions who verbalized understanding of the instructions. No further questions at time of discharge.    Discharge Medication List as of 03/23/2017  7:10 AM    START taking these medications   Details  enoxaparin (LOVENOX) 60 MG/0.6ML injection Inject 0.6 mLs (60 mg total) into the skin daily., Starting Sun 03/23/2017, Until Tue 03/25/2017, Print    ondansetron (ZOFRAN ODT) 4 MG disintegrating tablet Take 1 tablet (4 mg total) by mouth every 8 (eight) hours as needed for nausea or vomiting., Starting Sun 03/23/2017, Until Wed 03/26/2017, Print    warfarin (COUMADIN) 5 MG tablet Take 7.5 mg of Coumadin daily for the next 2 days. After that take 5 mg daily, unless the physicians at the dialysis center one to keep by mouth and a 7.5 mg., Print        Follow Up: Lolita Patella, [place] [place] [address] [phone]  Schedule an appointment as soon as possible for a visit        Leonette Monarch Grayce Sessions, MD 03/23/17 [phone]

## 2017-03-24 ENCOUNTER — Encounter (HOSPITAL_COMMUNITY): Payer: Self-pay | Admitting: Emergency Medicine

## 2017-03-24 ENCOUNTER — Emergency Department (HOSPITAL_COMMUNITY)
Admission: EM | Admit: 2017-03-24 | Discharge: 2017-03-24 | Disposition: A | Discharge status: Home / Self Care | Payer: Medicare Other | Attending: Emergency Medicine | Admitting: Emergency Medicine

## 2017-03-24 DIAGNOSIS — F17210 Nicotine dependence, cigarettes, uncomplicated: Secondary | ICD-10-CM | POA: Insufficient documentation

## 2017-03-24 DIAGNOSIS — E1122 Type 2 diabetes mellitus with diabetic chronic kidney disease: Secondary | ICD-10-CM | POA: Insufficient documentation

## 2017-03-24 DIAGNOSIS — I5042 Chronic combined systolic (congestive) and diastolic (congestive) heart failure: Secondary | ICD-10-CM | POA: Insufficient documentation

## 2017-03-24 DIAGNOSIS — N186 End stage renal disease: Secondary | ICD-10-CM | POA: Diagnosis not present

## 2017-03-24 DIAGNOSIS — I132 Hypertensive heart and chronic kidney disease with heart failure and with stage 5 chronic kidney disease, or end stage renal disease: Secondary | ICD-10-CM | POA: Insufficient documentation

## 2017-03-24 DIAGNOSIS — L02214 Cutaneous abscess of groin: Secondary | ICD-10-CM | POA: Diagnosis not present

## 2017-03-24 DIAGNOSIS — R2241 Localized swelling, mass and lump, right lower limb: Secondary | ICD-10-CM | POA: Diagnosis present

## 2017-03-24 DIAGNOSIS — Z7901 Long term (current) use of anticoagulants: Secondary | ICD-10-CM | POA: Diagnosis not present

## 2017-03-24 DIAGNOSIS — Z79899 Other long term (current) drug therapy: Secondary | ICD-10-CM | POA: Insufficient documentation

## 2017-03-24 DIAGNOSIS — L0291 Cutaneous abscess, unspecified: Secondary | ICD-10-CM

## 2017-03-24 NOTE — Discharge Instructions (Signed)
You were evaluated in the ED for small abscess to right groin after use on an insect bite you. You reported that you were able to express some pus and since redness has improved. On exam, abscess is healing well without evidence of re-collection or expansion. Continue cleaning the area with alcohol wipes and antibiotic ointment. Monitor for signs of infection including redness, swelling, pain, warmth or discharge.

## 2017-03-24 NOTE — ED Provider Notes (Signed)
[facility] DEPT Provider Note   CSN: [id number] Arrival date & time: 03/24/17  1226     History   Chief Complaint Chief Complaint  Patient presents with  . Abscess    HPI PAXSON HARROWER is a 51 y.o. male presents to the ED for evaluation of abscess to right inguinal region since yesterday after a "small bug" bit him there. Since pt has developed a knot. He initially had redness, swelling and tenderness but he expressed yellow drainage out of it last night and symptoms have significantly improved since. He reports mild tenderness to this area only. Denies fevers, chills. He is not sure what type of insect bit him. Has not tried any OTC medications for pain.   HPI  Past Medical History:  Diagnosis Date  . Anemia   . Anxiety   . Chronic combined systolic and diastolic CHF (congestive heart failure) (HCC)    a. EF 20-25% by echo in 08/2015 b. echo 10/2015: EF 35-40%, diffuse HK, severe LAE, moderate RAE, small pericardial effusion  . Complication of anesthesia    itching, sore throat  . Depression   . Dialysis patient ([place])   . DVT (deep venous thrombosis) ([place]) 02/2017  . ESRD (end stage renal disease) ([place])    due to HTN per patient, followed at [facility], s/p failed kidney transplant - dialysis Tue, Th, Sat  . Hyperkalemia 12/2015  . Hypertension   . Junctional rhythm    a. noted in 08/2015: hyperkalemic at that time  b. 12/2015: presented in junctional rhythm w/ K+ of 6.6. Resolved with improvement of K+ levels.  . Nonischemic cardiomyopathy ([place])    a. 08/2014: cath showing minimal CAD, but tortuous arteries noted.   . Personal history of DVT (deep vein thrombosis)/ PE 05/26/2016   In Oct 2015 had small subsemental LUL PE w/o DVT (LE dopplers neg) and was felt to be HD cath related, treated w coumadin.  IN May 2016 had small vein DVT (acute/subacute) in the R basilic/ brachial veins of the RUE, resumed on coumadin.  Had R sided HD cath at that time.    . Renal insufficiency     . Shortness of breath   . Type II diabetes mellitus (HCC)    No history per patient, but remains under history as A1c would not be accurate given on dialysis    Patient Active Problem List   Diagnosis Date Noted  . Acute DVT (deep venous thrombosis) ([place]) 03/13/2017  . DVT (deep venous thrombosis) ([place]) 03/11/2017  . Anemia 02/24/2017  . Volume overload 01/13/2017  . Aortic atherosclerosis ([place]) 01/05/2017  . Abdominal pain 08/04/2016  . Uremia 06/07/2016  . Hyperkalemia 05/29/2016  . GERD (gastroesophageal reflux disease) 05/29/2016  . Personal history of DVT (deep vein thrombosis)/ PE 05/26/2016  . Nonischemic cardiomyopathy ([place]) 01/09/2016  . Bilateral low back pain without sciatica   . Renal cyst, left 10/30/2015  . Constipation by delayed colonic transit 10/30/2015  . Acute on chronic combined systolic and diastolic congestive heart failure ([place]) 09/23/2015  . Chest pain 09/08/2015  . Adjustment disorder with mixed anxiety and depressed mood 08/20/2015  . Essential hypertension 01/02/2015  . Dyslipidemia   . Malignant hypertension 11/29/2014  . ESRD on hemodialysis ([place])   . Acute pulmonary edema (HCC)   . DM (diabetes mellitus), type 2, uncontrolled, with renal complications ([place])   . Complex sleep apnea syndrome 05/05/2014  . Anemia of chronic kidney failure 06/24/2013  . Nausea and vomiting 06/24/2013  Past Surgical History:  Procedure Laterality Date  . CAPD INSERTION    . CAPD REMOVAL    . INGUINAL HERNIA REPAIR Right 02/14/2015   Procedure: REPAIR INCARCERATED RIGHT INGUINAL HERNIA;  Surgeon: [name], MD;  Location: [facility];  Service: General;  Laterality: Right;  . INSERTION OF DIALYSIS CATHETER Right 09/23/2015   Procedure: exchange of Right internal Dialysis Catheter.;  Surgeon: [name], MD;  Location: [facility];  Service: Vascular;  Laterality: Right;  . IR GENERIC HISTORICAL  07/16/2016   IR US GUIDE VASC ACCESS LEFT 07/16/2016 Corrie Mckusick, DO  [facility]-INTER[facility]  . IR GENERIC HISTORICAL Left 07/16/2016   IR THROMBECTOMY AV FISTULA W/THROMBOLYSIS/PTA INC/SHUNT/IMG LEFT 07/16/2016 Corrie Mckusick, DO [facility]  . KIDNEY RECEIPIENT  2006   failed and started HD in March 2014  . LEFT HEART CATHETERIZATION WITH CORONARY ANGIOGRAM N/A 09/02/2014   Procedure: LEFT HEART CATHETERIZATION WITH CORONARY ANGIOGRAM;  Surgeon: [name], MD;  Location: [facility] CATH LAB;  Service: Cardiovascular;  Laterality: N/A;       Home Medications    Prior to Admission medications   Medication Sig Start Date End Date Taking? Authorizing Provider  amLODipine (NORVASC) 10 MG tablet Take 1 tablet (10 mg total) by mouth at bedtime. 12/01/16   Rice, Resa Miner, MD  cloNIDine (CATAPRES - DOSED IN MG/24 HR) 0.3 mg/24hr patch Place 0.3 mg onto the skin once a week. On Wednesday  12/31/16   [provider]  enoxaparin (LOVENOX) 60 MG/0.6ML injection Inject 0.6 mLs (60 mg total) into the skin daily. 03/23/17 03/25/17  Fatima Blank, MD  hydrALAZINE (APRESOLINE) 100 MG tablet Take 1 tablet (100 mg total) by mouth 2 (two) times daily. 01/07/17   Ledell Noss, MD  isosorbide mononitrate (IMDUR) 60 MG 24 hr tablet Take 1 tablet (60 mg total) by mouth daily. 01/08/17   Ledell Noss, MD  metoCLOPramide (REGLAN) 5 MG tablet Take 1 tablet (5 mg total) by mouth every 8 (eight) hours as needed for nausea. 12/01/16   Rice, Resa Miner, MD  ondansetron (ZOFRAN ODT) 4 MG disintegrating tablet Take 1 tablet (4 mg total) by mouth every 8 (eight) hours as needed for nausea or vomiting. 03/23/17 03/26/17  Fatima Blank, MD  pantoprazole (PROTONIX) 40 MG tablet Take 1 tablet (40 mg total) by mouth 2 (two) times daily before a meal. 12/01/16   Rice, Resa Miner, MD  sevelamer carbonate (RENVELA) 800 MG tablet Take 1 tablet (800 mg total) by mouth 3 (three) times daily with meals. Patient taking differently: Take 800 mg by mouth 3 (three) times daily with meals. 800 mg as  needed with snack 01/06/17   Ledell Noss, MD  traMADol (ULTRAM) 50 MG tablet Take 1 tablet (50 mg total) by mouth every 12 (twelve) hours as needed for moderate pain or severe pain. 12/01/16   Collier Salina, MD  warfarin (COUMADIN) 5 MG tablet Take 7.5 mg of Coumadin daily for the next 2 days. After that take 5 mg daily, unless the physicians at the dialysis center one to keep by mouth and a 7.5 mg. 03/23/17   Cardama, Grayce Sessions, MD    Family History Family History  Problem Relation Age of Onset  . Hypertension Other     Social History Social History  Substance Use Topics  . Smoking status: Former Smoker    Packs/day: 0.00    Years: 1.00    Types: Cigarettes  . Smokeless tobacco: Never Used  Comment: quit Jan 2014  . Alcohol use No     Allergies   Butalbital-apap-caffeine; Ferrlecit [na ferric gluc cplx in sucrose]; Minoxidil; Tylenol [acetaminophen]; and Darvocet [propoxyphene n-acetaminophen]   Review of Systems Review of Systems  Constitutional: Negative for chills, diaphoresis and fever.  Musculoskeletal: Negative for myalgias.  Skin: Positive for color change.     Physical Exam Updated Vital Signs BP (!) 155/96 (BP Location: Right Arm)   Pulse 69   Temp 98 F (36.7 C) (Oral)   Resp 20   SpO2 98%   Physical Exam  Constitutional: He is oriented to person, place, and time. He appears well-developed and well-nourished. No distress.  NAD.  HENT:  Head: Normocephalic and atraumatic.  Right Ear: External ear normal.  Left Ear: External ear normal.  Nose: Nose normal.  Eyes: Conjunctivae and EOM are normal. No scleral icterus.  Neck: Normal range of motion. Neck supple.  Cardiovascular: Normal rate, regular rhythm, normal heart sounds and intact distal pulses.   No murmur heard. Pulmonary/Chest: Effort normal and breath sounds normal. He has no wheezes.  Musculoskeletal: Normal range of motion. He exhibits no deformity.  Neurological: He is alert and  oriented to person, place, and time.  Skin: Skin is warm and dry. Capillary refill takes less than 2 seconds. Lesion noted.     1x1 cm open abscess to right inguinal region with mild tenderness, no fluctuance,warmth, erythema or edema. No surrounding cellulitis.   Psychiatric: He has a normal mood and affect. His behavior is normal. Judgment and thought content normal.  Nursing note and vitals reviewed.    ED Treatments / Results  Labs (all labs ordered are listed, but only abnormal results are displayed) Labs Reviewed - No data to display  EKG  EKG Interpretation None       Radiology No results found.  Procedures Procedures (including critical care time)  Medications Ordered in ED Medications - No data to display   Initial Impression / Assessment and Plan / ED Course  I have reviewed the triage vital signs and the nursing notes.  Pertinent labs & imaging results that were available during my care of the patient were reviewed by me and considered in my medical decision making (see chart for details).    51 year old male presents to the ED for evaluation of abscess to right inguinal region after an insect bit him there. He was able to express purulent discharge last night and redness, swelling, tenderness has since improved significantly. Exam is consistent with a well-healing abscess without evidence every collection or surrounding cellulitis. No further emergent lab work, imaging or procedure indicated at this time as abscess appears to be healing well. No systemic symptoms including fevers or chills. We'll reassure patient and discharge. He is aware of symptoms that would warrant return to the ED.  Final Clinical Impressions(s) / ED Diagnoses   Final diagnoses:  Abscess    New Prescriptions Discharge Medication List as of 03/24/2017  2:39 PM       [name], PA-C 03/24/17 1514    Lacretia Leigh, MD 03/27/17 1406

## 2017-03-24 NOTE — ED Triage Notes (Signed)
Pt states that he had a bug bite him at 2 am this morning and now has an abscess in his R groin area. Alert and oriented.

## 2017-03-25 DIAGNOSIS — D631 Anemia in chronic kidney disease: Secondary | ICD-10-CM | POA: Diagnosis not present

## 2017-03-25 DIAGNOSIS — N2581 Secondary hyperparathyroidism of renal origin: Secondary | ICD-10-CM | POA: Diagnosis not present

## 2017-03-25 DIAGNOSIS — N186 End stage renal disease: Secondary | ICD-10-CM | POA: Diagnosis not present

## 2017-03-26 DIAGNOSIS — R112 Nausea with vomiting, unspecified: Secondary | ICD-10-CM | POA: Diagnosis not present

## 2017-03-26 DIAGNOSIS — E1122 Type 2 diabetes mellitus with diabetic chronic kidney disease: Secondary | ICD-10-CM | POA: Diagnosis not present

## 2017-03-26 DIAGNOSIS — Z79899 Other long term (current) drug therapy: Secondary | ICD-10-CM | POA: Diagnosis not present

## 2017-03-26 DIAGNOSIS — R1084 Generalized abdominal pain: Secondary | ICD-10-CM | POA: Diagnosis not present

## 2017-03-26 DIAGNOSIS — Z7901 Long term (current) use of anticoagulants: Secondary | ICD-10-CM | POA: Diagnosis not present

## 2017-03-26 DIAGNOSIS — Z87892 Personal history of anaphylaxis: Secondary | ICD-10-CM | POA: Diagnosis not present

## 2017-03-26 DIAGNOSIS — R188 Other ascites: Secondary | ICD-10-CM | POA: Diagnosis not present

## 2017-03-26 DIAGNOSIS — K5900 Constipation, unspecified: Secondary | ICD-10-CM | POA: Diagnosis not present

## 2017-03-26 DIAGNOSIS — N2889 Other specified disorders of kidney and ureter: Secondary | ICD-10-CM | POA: Diagnosis not present

## 2017-03-26 DIAGNOSIS — R1031 Right lower quadrant pain: Secondary | ICD-10-CM | POA: Diagnosis not present

## 2017-03-26 DIAGNOSIS — N3289 Other specified disorders of bladder: Secondary | ICD-10-CM | POA: Diagnosis not present

## 2017-03-26 DIAGNOSIS — Z992 Dependence on renal dialysis: Secondary | ICD-10-CM | POA: Diagnosis not present

## 2017-03-26 DIAGNOSIS — I120 Hypertensive chronic kidney disease with stage 5 chronic kidney disease or end stage renal disease: Secondary | ICD-10-CM | POA: Diagnosis not present

## 2017-03-26 DIAGNOSIS — R0989 Other specified symptoms and signs involving the circulatory and respiratory systems: Secondary | ICD-10-CM | POA: Diagnosis not present

## 2017-03-26 DIAGNOSIS — N186 End stage renal disease: Secondary | ICD-10-CM | POA: Diagnosis not present

## 2017-03-26 DIAGNOSIS — I517 Cardiomegaly: Secondary | ICD-10-CM | POA: Diagnosis not present

## 2017-03-26 DIAGNOSIS — K828 Other specified diseases of gallbladder: Secondary | ICD-10-CM | POA: Diagnosis not present

## 2017-03-26 IMAGING — CT CT ABD-PELV W/O CM
1 of 2 series · 15 of 32 positions shown, 19 images · non-contrast
Comparison: 12/21/2013

CLINICAL DATA: Lower left abdominal pain for 1 day

EXAM:
CT ABDOMEN AND PELVIS WITHOUT CONTRAST
TECHNIQUE: Multidetector CT imaging of the abdomen and pelvis was performed
following the standard protocol without IV contrast.

[Series 2: abd/pel w/o · axial · non-contrast · 0.70mm/px · z∈[-480,-60]mm · 15 of 92 slices shown, 19 images]
[im 4/92  soft-tissue]
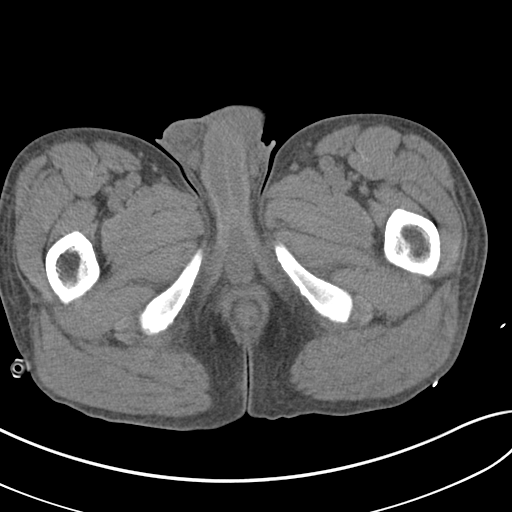
[im 4/92  bone]
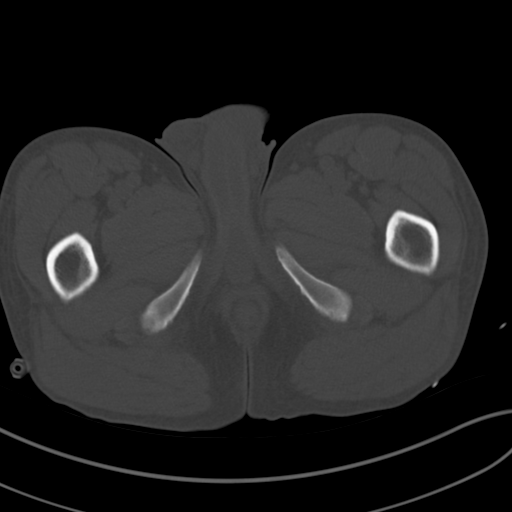
[im 11/92  soft-tissue]
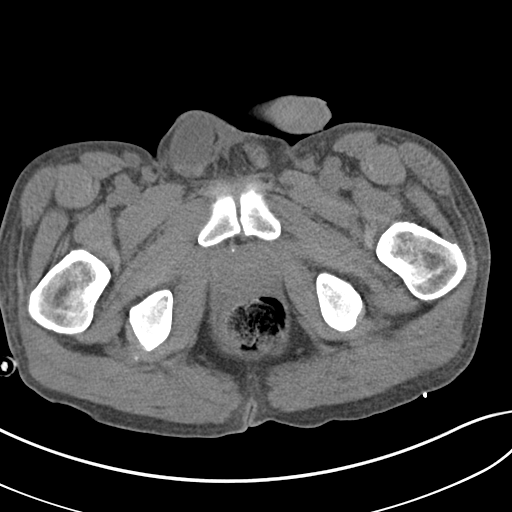
[im 19/92  soft-tissue]
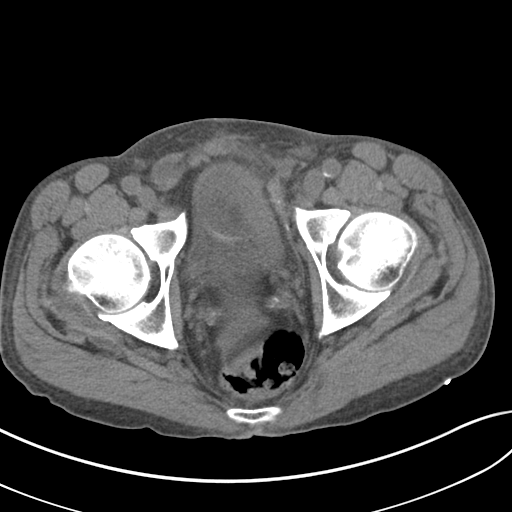
[im 26/92  soft-tissue]
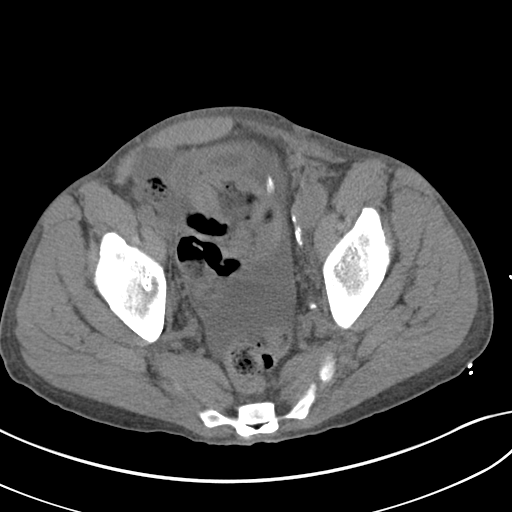
[im 33/92  soft-tissue]
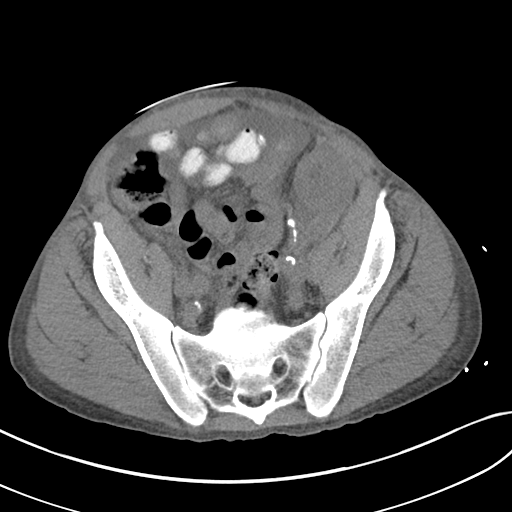
[im 41/92  soft-tissue]
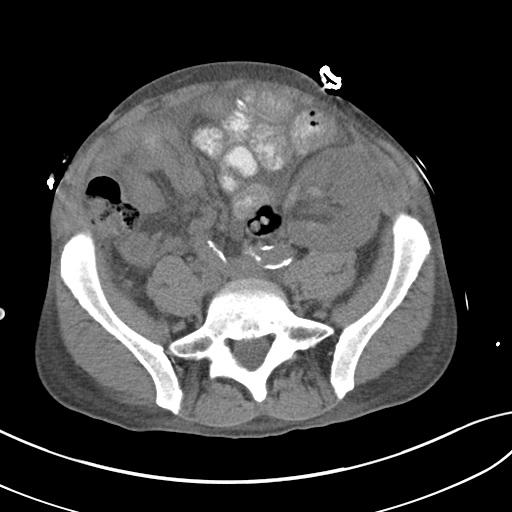
[im 48/92  soft-tissue]
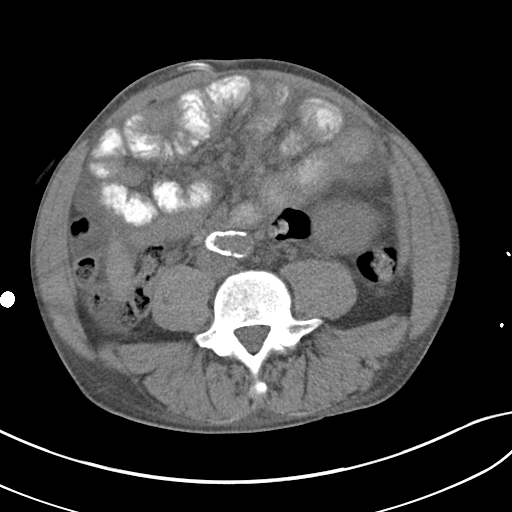
[im 51/92  soft-tissue]
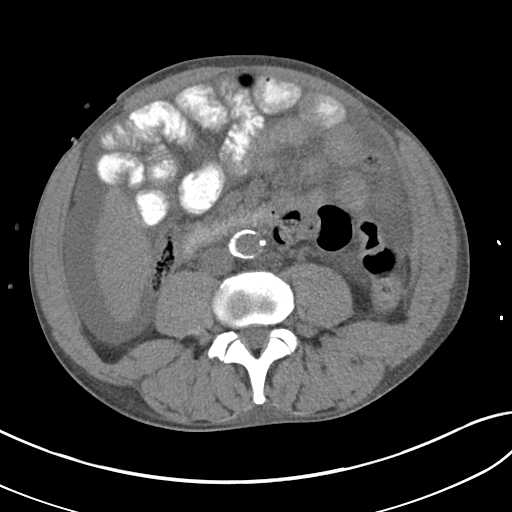
[im 59/92  soft-tissue]
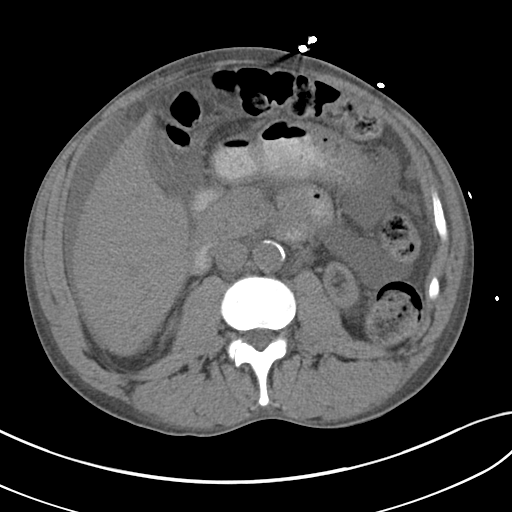
[im 59/92  bone]
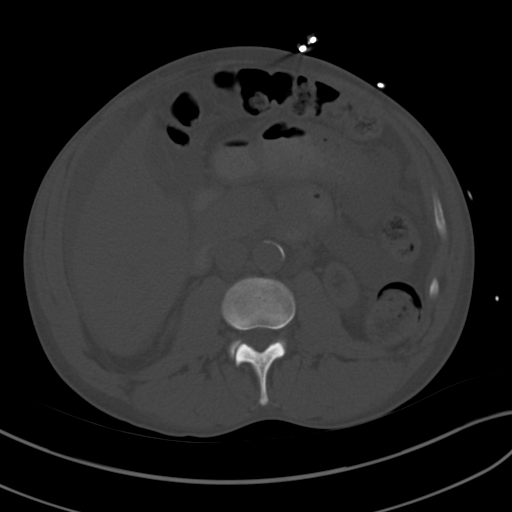
[im 66/92  soft-tissue]
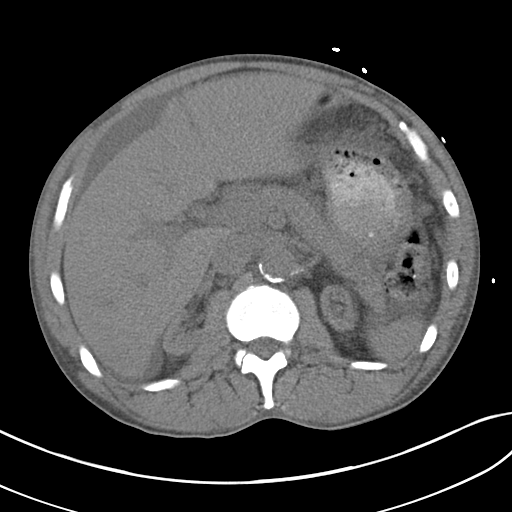
[im 73/92  soft-tissue]
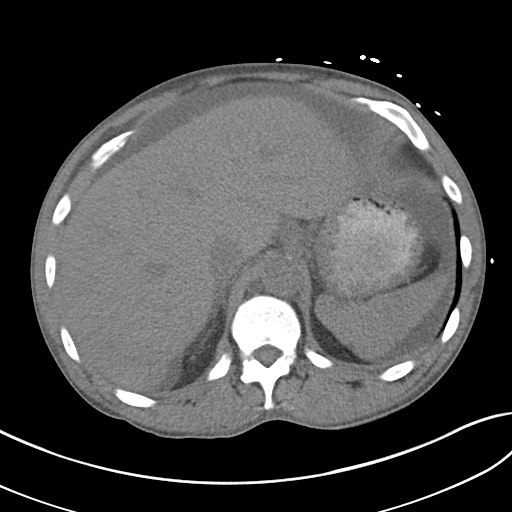
[im 77/92  lung]
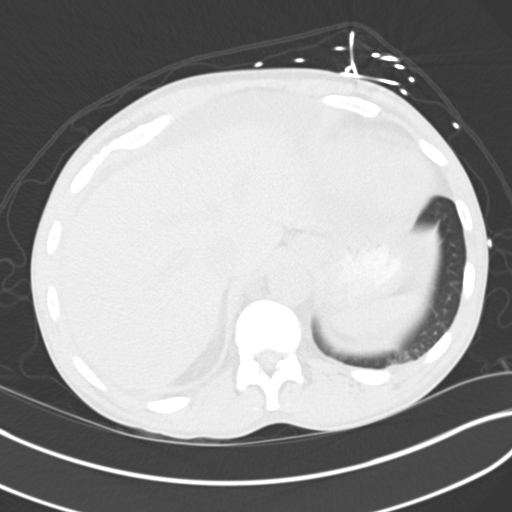
[im 81/92  soft-tissue]
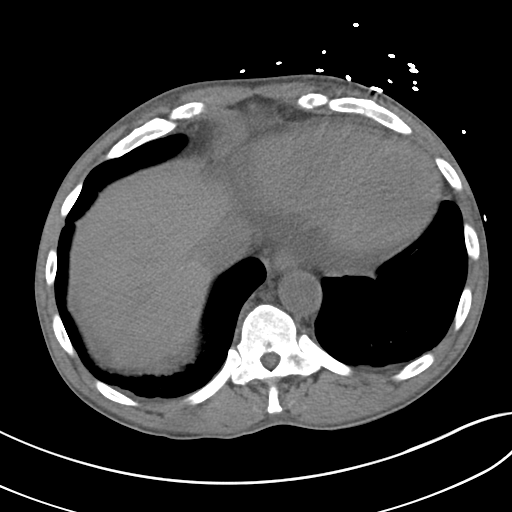
[im 81/92  lung]
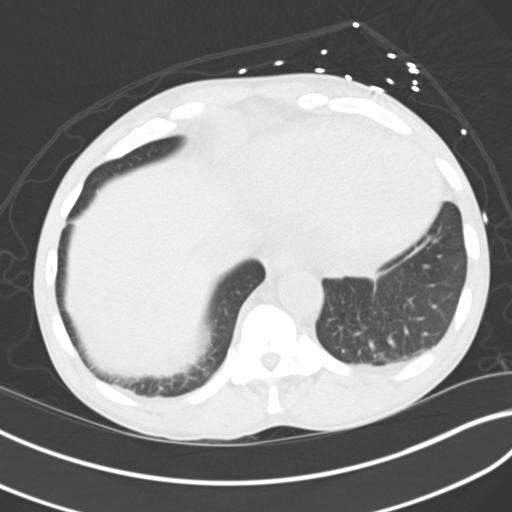
[im 84/92  lung]
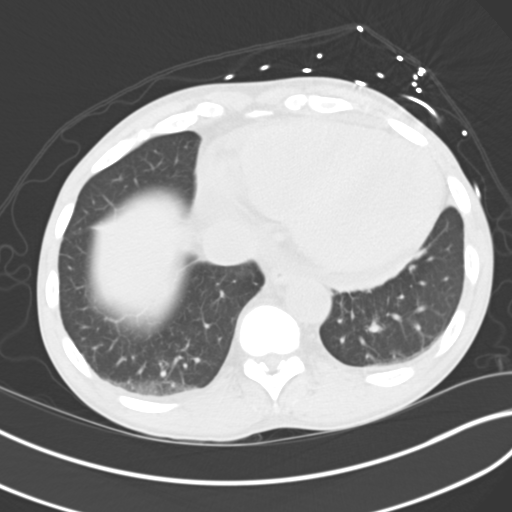
[im 88/92  soft-tissue]
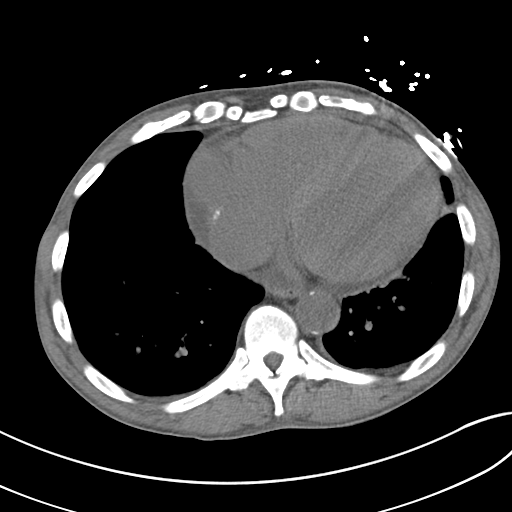
[im 88/92  lung]
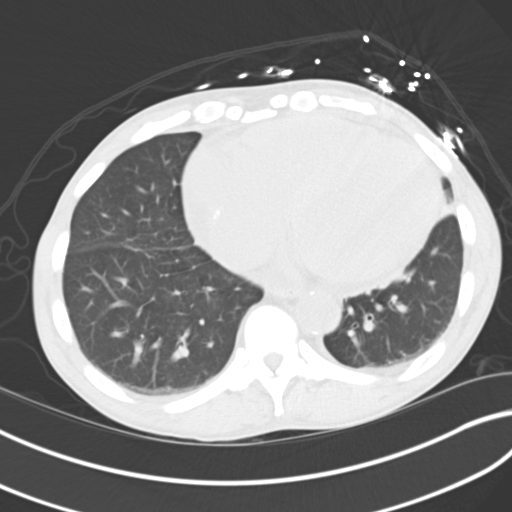

[15 of 32 positions shown; findings below may reference images not displayed]

FINDINGS: Lung bases are free of acute infiltrate or sizable effusion.

Cardiac shadow is enlarged. A small pericardial effusion is noted.
The liver, gallbladder, spleen, adrenal glands and pancreas are
within normal limits. The native kidneys are shrunken consistent
with patient's given clinical history of end-stage renal disease. A
renal transplant is noted within the left lower quadrant stable from
the prior study.

Mild ascites is noted consistent with the peritoneal dialysis
history. The dialysis catheter is noted low within the pelvis just
above the bladder. The bladder is decompressed. A right inguinal
hernia containing some dialysate fluid is noted. Diffuse aortoiliac
calcifications are noted. No acute bony abnormality is noted.
IMPRESSION: Chronic changes consistent with the patient's given clinical
history. No acute abnormality is noted.

## 2017-03-27 DIAGNOSIS — N2581 Secondary hyperparathyroidism of renal origin: Secondary | ICD-10-CM | POA: Diagnosis not present

## 2017-03-27 DIAGNOSIS — N2889 Other specified disorders of kidney and ureter: Secondary | ICD-10-CM | POA: Diagnosis not present

## 2017-03-27 DIAGNOSIS — D631 Anemia in chronic kidney disease: Secondary | ICD-10-CM | POA: Diagnosis not present

## 2017-03-27 DIAGNOSIS — N186 End stage renal disease: Secondary | ICD-10-CM | POA: Diagnosis not present

## 2017-03-27 DIAGNOSIS — K828 Other specified diseases of gallbladder: Secondary | ICD-10-CM | POA: Diagnosis not present

## 2017-03-27 DIAGNOSIS — N3289 Other specified disorders of bladder: Secondary | ICD-10-CM | POA: Diagnosis not present

## 2017-03-27 DIAGNOSIS — R188 Other ascites: Secondary | ICD-10-CM | POA: Diagnosis not present

## 2017-03-29 DIAGNOSIS — Z992 Dependence on renal dialysis: Secondary | ICD-10-CM | POA: Diagnosis not present

## 2017-03-29 DIAGNOSIS — I161 Hypertensive emergency: Secondary | ICD-10-CM | POA: Diagnosis not present

## 2017-03-29 DIAGNOSIS — E875 Hyperkalemia: Secondary | ICD-10-CM | POA: Diagnosis not present

## 2017-03-29 DIAGNOSIS — K828 Other specified diseases of gallbladder: Secondary | ICD-10-CM | POA: Diagnosis not present

## 2017-03-29 DIAGNOSIS — I10 Essential (primary) hypertension: Secondary | ICD-10-CM | POA: Diagnosis not present

## 2017-03-29 DIAGNOSIS — T829XXA Unspecified complication of cardiac and vascular prosthetic device, implant and graft, initial encounter: Secondary | ICD-10-CM | POA: Diagnosis not present

## 2017-03-29 DIAGNOSIS — N179 Acute kidney failure, unspecified: Secondary | ICD-10-CM | POA: Diagnosis not present

## 2017-03-29 DIAGNOSIS — K5900 Constipation, unspecified: Secondary | ICD-10-CM | POA: Diagnosis not present

## 2017-03-29 DIAGNOSIS — N261 Atrophy of kidney (terminal): Secondary | ICD-10-CM | POA: Diagnosis not present

## 2017-03-29 DIAGNOSIS — R6889 Other general symptoms and signs: Secondary | ICD-10-CM | POA: Diagnosis not present

## 2017-03-29 DIAGNOSIS — I5022 Chronic systolic (congestive) heart failure: Secondary | ICD-10-CM | POA: Diagnosis not present

## 2017-03-29 DIAGNOSIS — I700 Atherosclerosis of aorta: Secondary | ICD-10-CM | POA: Diagnosis not present

## 2017-03-29 DIAGNOSIS — E1122 Type 2 diabetes mellitus with diabetic chronic kidney disease: Secondary | ICD-10-CM | POA: Diagnosis not present

## 2017-03-29 DIAGNOSIS — N281 Cyst of kidney, acquired: Secondary | ICD-10-CM | POA: Diagnosis not present

## 2017-03-29 DIAGNOSIS — N186 End stage renal disease: Secondary | ICD-10-CM | POA: Diagnosis not present

## 2017-03-29 DIAGNOSIS — I132 Hypertensive heart and chronic kidney disease with heart failure and with stage 5 chronic kidney disease, or end stage renal disease: Secondary | ICD-10-CM | POA: Diagnosis not present

## 2017-03-29 DIAGNOSIS — R1011 Right upper quadrant pain: Secondary | ICD-10-CM | POA: Diagnosis not present

## 2017-03-29 DIAGNOSIS — R109 Unspecified abdominal pain: Secondary | ICD-10-CM | POA: Diagnosis not present

## 2017-03-29 DIAGNOSIS — J90 Pleural effusion, not elsewhere classified: Secondary | ICD-10-CM | POA: Diagnosis not present

## 2017-03-29 DIAGNOSIS — R0602 Shortness of breath: Secondary | ICD-10-CM | POA: Diagnosis not present

## 2017-03-29 DIAGNOSIS — J811 Chronic pulmonary edema: Secondary | ICD-10-CM | POA: Diagnosis not present

## 2017-03-30 ENCOUNTER — Encounter (HOSPITAL_COMMUNITY): Payer: Self-pay

## 2017-03-30 ENCOUNTER — Emergency Department (HOSPITAL_COMMUNITY): Payer: Medicare Other

## 2017-03-30 ENCOUNTER — Emergency Department (HOSPITAL_COMMUNITY)
Admission: EM | Admit: 2017-03-30 | Discharge: 2017-03-30 | Disposition: A | Discharge status: Home / Self Care | Payer: Medicare Other | Attending: Emergency Medicine | Admitting: Emergency Medicine

## 2017-03-30 DIAGNOSIS — I5042 Chronic combined systolic (congestive) and diastolic (congestive) heart failure: Secondary | ICD-10-CM | POA: Insufficient documentation

## 2017-03-30 DIAGNOSIS — E1122 Type 2 diabetes mellitus with diabetic chronic kidney disease: Secondary | ICD-10-CM | POA: Insufficient documentation

## 2017-03-30 DIAGNOSIS — N186 End stage renal disease: Secondary | ICD-10-CM | POA: Insufficient documentation

## 2017-03-30 DIAGNOSIS — I132 Hypertensive heart and chronic kidney disease with heart failure and with stage 5 chronic kidney disease, or end stage renal disease: Secondary | ICD-10-CM | POA: Insufficient documentation

## 2017-03-30 DIAGNOSIS — F17210 Nicotine dependence, cigarettes, uncomplicated: Secondary | ICD-10-CM | POA: Diagnosis not present

## 2017-03-30 DIAGNOSIS — I120 Hypertensive chronic kidney disease with stage 5 chronic kidney disease or end stage renal disease: Secondary | ICD-10-CM | POA: Diagnosis not present

## 2017-03-30 DIAGNOSIS — R918 Other nonspecific abnormal finding of lung field: Secondary | ICD-10-CM | POA: Diagnosis not present

## 2017-03-30 DIAGNOSIS — Z87891 Personal history of nicotine dependence: Secondary | ICD-10-CM | POA: Diagnosis not present

## 2017-03-30 DIAGNOSIS — Z992 Dependence on renal dialysis: Secondary | ICD-10-CM | POA: Insufficient documentation

## 2017-03-30 DIAGNOSIS — Z452 Encounter for adjustment and management of vascular access device: Secondary | ICD-10-CM | POA: Diagnosis present

## 2017-03-30 LAB — I-STAT CHEM 8, ED
BUN: 69 mg/dL — ABNORMAL HIGH (ref 6–20)
Calcium, Ion: 0.96 mmol/L — ABNORMAL LOW (ref 1.15–1.40)
Chloride: 102 mmol/L (ref 101–111)
Creatinine, Ser: 13.7 mg/dL — ABNORMAL HIGH (ref 0.61–1.24)
Glucose, Bld: 96 mg/dL (ref 65–99)
HCT: 27 % — ABNORMAL LOW (ref 39.0–52.0)
Hemoglobin: 9.2 g/dL — ABNORMAL LOW (ref 13.0–17.0)
Potassium: 5.3 mmol/L — ABNORMAL HIGH (ref 3.5–5.1)
Sodium: 140 mmol/L (ref 135–145)
TCO2: 28 mmol/L (ref 22–32)

## 2017-03-30 LAB — PROTIME-INR
INR: 1.15
Prothrombin Time: 14.6 seconds (ref 11.4–15.2)

## 2017-03-30 MED ORDER — ONDANSETRON 4 MG PO TBDP
4.0000 mg | ORAL_TABLET | Freq: Once | ORAL | Status: AC
Start: 2017-03-30 — End: 2017-03-30
  Administered 2017-03-30: 4 mg via ORAL
  Filled 2017-03-30: qty 1

## 2017-03-30 MED ORDER — ONDANSETRON 8 MG PO TBDP: ORAL_TABLET | ORAL | 0 refills | Status: DC

## 2017-03-30 MED ORDER — HYDROMORPHONE HCL 1 MG/ML IJ SOLN
1.0000 mg | Freq: Once | INTRAMUSCULAR | Status: AC
  Administered 2017-03-30: 1 mg via INTRAMUSCULAR
  Filled 2017-03-30: qty 1

## 2017-03-30 MED ORDER — WARFARIN SODIUM 5 MG PO TABS: ORAL_TABLET | ORAL | 0 refills | Status: DC

## 2017-03-30 NOTE — ED Notes (Signed)
Tuesday, thurday, Saturday dialysis. Last treatment thursday

## 2017-03-30 NOTE — ED Provider Notes (Signed)
Emergency Department Provider Note   I have reviewed the triage vital signs and the nursing notes.   HISTORY  Chief Complaint Vascular Access Problem   HPI Frank [name] is a 51 y.o. male with end-stage renal disease on dialysis that comes here saying he hasn't had dialysis since Thursday because his fistula is not working. He states he has a little bit of vomiting and nausea as well as not been taking his medications. Review of records it seems that the patient is to start by noncompliant for multiple different reasons and patient has not a nausea vomiting here in fact asked for apple juice before even got his Zofran. Patient is not claiming any shortness of breath or chest pain or lower extremity swelling worse than normal.   Past Medical History:  Diagnosis Date  . Anemia   . Anxiety   . Chronic combined systolic and diastolic CHF (congestive heart failure) (HCC)    a. EF 20-25% by echo in 08/2015 b. echo 10/2015: EF 35-40%, diffuse HK, severe LAE, moderate RAE, small pericardial effusion  . Complication of anesthesia    itching, sore throat  . Depression   . Dialysis patient ([place])   . DVT (deep venous thrombosis) ([place]) 02/2017  . ESRD (end stage renal disease) ([place])    due to HTN per patient, followed at [facility], s/p failed kidney transplant - dialysis Tue, Th, Sat  . Hyperkalemia 12/2015  . Hypertension   . Junctional rhythm    a. noted in 08/2015: hyperkalemic at that time  b. 12/2015: presented in junctional rhythm w/ K+ of 6.6. Resolved with improvement of K+ levels.  . Nonischemic cardiomyopathy ([place])    a. 08/2014: cath showing minimal CAD, but tortuous arteries noted.   . Personal history of DVT (deep vein thrombosis)/ PE 05/26/2016   In Oct 2015 had small subsemental LUL PE w/o DVT (LE dopplers neg) and was felt to be HD cath related, treated w coumadin.  IN May 2016 had small vein DVT (acute/subacute) in the R basilic/ brachial veins of the RUE, resumed on coumadin.   Had R sided HD cath at that time.    . Renal insufficiency   . Shortness of breath   . Type II diabetes mellitus (HCC)    No history per patient, but remains under history as A1c would not be accurate given on dialysis    Patient Active Problem List   Diagnosis Date Noted  . Acute DVT (deep venous thrombosis) ([place]) 03/13/2017  . DVT (deep venous thrombosis) ([place]) 03/11/2017  . Anemia 02/24/2017  . Volume overload 01/13/2017  . Aortic atherosclerosis ([place]) 01/05/2017  . Abdominal pain 08/04/2016  . Uremia 06/07/2016  . Hyperkalemia 05/29/2016  . GERD (gastroesophageal reflux disease) 05/29/2016  . Personal history of DVT (deep vein thrombosis)/ PE 05/26/2016  . Nonischemic cardiomyopathy ([place]) 01/09/2016  . Bilateral low back pain without sciatica   . Renal cyst, left 10/30/2015  . Constipation by delayed colonic transit 10/30/2015  . Acute on chronic combined systolic and diastolic congestive heart failure ([place]) 09/23/2015  . Chest pain 09/08/2015  . Adjustment disorder with mixed anxiety and depressed mood 08/20/2015  . Essential hypertension 01/02/2015  . Dyslipidemia   . Malignant hypertension 11/29/2014  . ESRD on hemodialysis ([place])   . Acute pulmonary edema (HCC)   . DM (diabetes mellitus), type 2, uncontrolled, with renal complications ([place])   . Complex sleep apnea syndrome 05/05/2014  . Anemia of chronic kidney failure 06/24/2013  .  Nausea and vomiting 06/24/2013    Past Surgical History:  Procedure Laterality Date  . CAPD INSERTION    . CAPD REMOVAL    . INGUINAL HERNIA REPAIR Right 02/14/2015   Procedure: REPAIR INCARCERATED RIGHT INGUINAL HERNIA;  Surgeon: [name], MD;  Location: [facility];  Service: General;  Laterality: Right;  . INSERTION OF DIALYSIS CATHETER Right 09/23/2015   Procedure: exchange of Right internal Dialysis Catheter.;  Surgeon: [name], MD;  Location: [place] [facility];  Service: Vascular;  Laterality: Right;  . IR GENERIC HISTORICAL  07/16/2016    IR US GUIDE VASC ACCESS LEFT 07/16/2016 Corrie Mckusick, DO [facility]-INTERV RAD  . IR GENERIC HISTORICAL Left 07/16/2016   IR THROMBECTOMY AV FISTULA W/THROMBOLYSIS/PTA INC/SHUNT/IMG LEFT 07/16/2016 Corrie Mckusick, DO [facility]-INTER[facility]  . KIDNEY RECEIPIENT  2006   failed and started HD in March 2014  . LEFT HEART CATHETERIZATION WITH CORONARY ANGIOGRAM N/A 09/02/2014   Procedure: LEFT HEART CATHETERIZATION WITH CORONARY ANGIOGRAM;  Surgeon: [name], MD;  Location: [facility] CATH LAB;  Service: Cardiovascular;  Laterality: N/A;    Current Outpatient Rx  . Order #: [id number] Class: Normal  . Order #: [id number] Class: Historical Med  . Order #: [id number] Class: Normal  . Order #: [id number] Class: Normal  . Order #: [id number] Class: Normal  . Order #: [id number] Class: Normal  . Order #: [id number] Class: Normal  . Order #: [id number] Class: Print  . Order #: [id number] Class: Print  . Order #: [id number] Class: Print  . Order #: [id number] Class: Print    Allergies Butalbital-apap-caffeine; Ferrlecit [na ferric gluc cplx in sucrose]; Minoxidil; Tylenol [acetaminophen]; and Darvocet [propoxyphene n-acetaminophen]  Family History  Problem Relation Age of Onset  . Hypertension Other     Social History Social History  Substance Use Topics  . Smoking status: Former Smoker    Packs/day: 0.00    Years: 1.00    Types: Cigarettes  . Smokeless tobacco: Never Used     Comment: quit Jan 2014  . Alcohol use No    Review of Systems  All other systems negative except as documented in the HPI. All pertinent positives and negatives as reviewed in the HPI. ____________________________________________  PHYSICAL EXAM:  VITAL SIGNS: ED Triage Vitals [03/30/17 1828]  Enc Vitals Group     BP (!) 145/92     Pulse Rate 62     Resp 16     Temp 97.9 F (36.6 C)     Temp Source Oral     SpO2 100 %     Weight 162 lb (73.5 kg)     Height _0  (1.88 m)     Head Circumference      Peak Flow      Pain Score 8     Pain  Loc      Pain Edu?      Excl. in [place]?     Constitutional: Alert and oriented. Well appearing and in no acute distress. Eyes: Conjunctivae are normal. PERRL. EOMI. Head: Atraumatic. Nose: No congestion/rhinnorhea. Mouth/Throat: Mucous membranes are moist.  Oropharynx non-erythematous. Neck: No stridor.  No meningeal signs.   Cardiovascular: Normal rate, regular rhythm. Good peripheral circulation. Grossly normal heart sounds.   Fistula in his left arm has a thrill throughout the entirety of it and also has bruits. Skin is intact no evidence of infection. Respiratory: Normal respiratory effort.  No retractions. Lungs CTAB. Gastrointestinal: Soft and nontender. No distention.  Musculoskeletal: No lower extremity tenderness nor edema. No gross deformities of extremities. Neurologic:  Normal  speech and language. No gross focal neurologic deficits are appreciated.  Skin:  Skin is warm, dry and intact. No rash noted.  ____________________________________________   LABS (all labs ordered are listed, but only abnormal results are displayed)  Labs Reviewed  I-STAT CHEM 8, ED - Abnormal; Notable for the following:       Result Value   Potassium 5.3 (*)    BUN 69 (*)    Creatinine, Ser 13.70 (*)    Calcium, Ion 0.96 (*)    Hemoglobin 9.2 (*)    HCT 27.0 (*)    All other components within normal limits  PROTIME-INR   ____________________________________________  EKG   EKG Interpretation  Date/Time:  Sunday March 30 2017 20:23:28 EDT Ventricular Rate:  63 PR Interval:    QRS Duration: 96 QT Interval:  490 QTC Calculation: 502 R Axis:   65 Text Interpretation:  Sinus rhythm Borderline prolonged PR interval Prolonged QT interval No significant change since last tracing Confirmed by Merrily Pew [phone]) on 03/30/2017 8:34:36 PM       ____________________________________________  RADIOLOGY  Dg Chest 2 View  Result Date: 03/30/2017 CLINICAL DATA:  50 year old male with  history of malfunctioning dialysis fistula. Missed dialysis yesterday. EXAM: CHEST  2 VIEW COMPARISON:  Chest x-ray 03/02/2017. FINDINGS: There is cephalization of the pulmonary vasculature and slight indistinctness of the interstitial markings suggestive of mild pulmonary edema. No definite pleural effusions. Mild cardiomegaly. Upper mediastinal contours are slightly distorted by patient's rotation the right. IMPRESSION: 1. Findings suggests mild congestive heart failure, as above. Electronically Signed   By: Vinnie Langton M.D.   On: 03/30/2017 21:12   ____________________________________________   PROCEDURES  Procedure(s) performed:   Procedures ____________________________________________   INITIAL IMPRESSION / ASSESSMENT AND PLAN / ED COURSE  Pertinent labs & imaging results that were available during my care of the patient were reviewed by me and considered in my medical decision making (see chart for details).  Patient's physician is seems to be working appropriately. I discussed with Dr.Dunham who reviewed the records and does not see any evidence of him trying to get dialysis all yesterday. His potassium is 5.3 with no EKG changes chest x-ray similar to previous ones without any respiratory distress is no indication for emergent dialysis at this time. She states that he should go to his dialysis center tomorrow and requests dialysis and/or a fistula evaluation if he thinks he needs it. ____________________________________________  FINAL CLINICAL IMPRESSION(S) / ED DIAGNOSES  Final diagnoses:  ESRD (end stage renal disease) ([place])    MEDICATIONS GIVEN DURING THIS VISIT:  Medications  ondansetron (ZOFRAN-ODT) disintegrating tablet 4 mg (4 mg Oral Given 03/30/17 2111)  HYDROmorphone (DILAUDID) injection 1 mg (1 mg Intramuscular Given 03/30/17 2231)    NEW OUTPATIENT MEDICATIONS STARTED DURING THIS VISIT:  Discharge Medication List as of 03/30/2017 10:28 PM    START taking these  medications   Details  ondansetron (ZOFRAN ODT) 8 MG disintegrating tablet 8m ODT q4 hours prn nausea, Print        Note:  This document was prepared using Dragon voice recognition software and may include unintentional dictation errors.    MMerrily Pew MD 03/30/17 [phone]

## 2017-03-30 NOTE — ED Notes (Signed)
Gave patient apple juice and ice.

## 2017-03-30 NOTE — ED Triage Notes (Signed)
Per Pt, Pt is coming from home with complaints of Vascular Access Problem. Pt has fistula in the left arm and complains of pain. Pt reports that it stopped working on Friday.

## 2017-03-30 NOTE — ED Notes (Signed)
Patient transported to X-ray 

## 2017-03-31 DIAGNOSIS — N179 Acute kidney failure, unspecified: Secondary | ICD-10-CM | POA: Diagnosis not present

## 2017-03-31 DIAGNOSIS — E875 Hyperkalemia: Secondary | ICD-10-CM | POA: Diagnosis not present

## 2017-03-31 DIAGNOSIS — I161 Hypertensive emergency: Secondary | ICD-10-CM | POA: Diagnosis not present

## 2017-03-31 DIAGNOSIS — R6889 Other general symptoms and signs: Secondary | ICD-10-CM | POA: Diagnosis not present

## 2017-03-31 DIAGNOSIS — I10 Essential (primary) hypertension: Secondary | ICD-10-CM | POA: Diagnosis not present

## 2017-04-01 DIAGNOSIS — R1011 Right upper quadrant pain: Secondary | ICD-10-CM | POA: Diagnosis not present

## 2017-04-01 DIAGNOSIS — I10 Essential (primary) hypertension: Secondary | ICD-10-CM | POA: Diagnosis not present

## 2017-04-01 DIAGNOSIS — J90 Pleural effusion, not elsewhere classified: Secondary | ICD-10-CM | POA: Diagnosis not present

## 2017-04-01 DIAGNOSIS — G4733 Obstructive sleep apnea (adult) (pediatric): Secondary | ICD-10-CM | POA: Diagnosis present

## 2017-04-01 DIAGNOSIS — I429 Cardiomyopathy, unspecified: Secondary | ICD-10-CM | POA: Diagnosis not present

## 2017-04-01 DIAGNOSIS — Z8042 Family history of malignant neoplasm of prostate: Secondary | ICD-10-CM | POA: Diagnosis not present

## 2017-04-01 DIAGNOSIS — N261 Atrophy of kidney (terminal): Secondary | ICD-10-CM | POA: Diagnosis not present

## 2017-04-01 DIAGNOSIS — D631 Anemia in chronic kidney disease: Secondary | ICD-10-CM | POA: Diagnosis not present

## 2017-04-01 DIAGNOSIS — J811 Chronic pulmonary edema: Secondary | ICD-10-CM | POA: Diagnosis not present

## 2017-04-01 DIAGNOSIS — K219 Gastro-esophageal reflux disease without esophagitis: Secondary | ICD-10-CM | POA: Diagnosis present

## 2017-04-01 DIAGNOSIS — Z7901 Long term (current) use of anticoagulants: Secondary | ICD-10-CM | POA: Diagnosis not present

## 2017-04-01 DIAGNOSIS — N185 Chronic kidney disease, stage 5: Secondary | ICD-10-CM | POA: Diagnosis not present

## 2017-04-01 DIAGNOSIS — Z79899 Other long term (current) drug therapy: Secondary | ICD-10-CM | POA: Diagnosis not present

## 2017-04-01 DIAGNOSIS — I5022 Chronic systolic (congestive) heart failure: Secondary | ICD-10-CM | POA: Diagnosis not present

## 2017-04-01 DIAGNOSIS — N281 Cyst of kidney, acquired: Secondary | ICD-10-CM | POA: Diagnosis not present

## 2017-04-01 DIAGNOSIS — F4323 Adjustment disorder with mixed anxiety and depressed mood: Secondary | ICD-10-CM | POA: Diagnosis present

## 2017-04-01 DIAGNOSIS — K828 Other specified diseases of gallbladder: Secondary | ICD-10-CM | POA: Diagnosis not present

## 2017-04-01 DIAGNOSIS — Z86718 Personal history of other venous thrombosis and embolism: Secondary | ICD-10-CM | POA: Diagnosis not present

## 2017-04-01 DIAGNOSIS — R109 Unspecified abdominal pain: Secondary | ICD-10-CM | POA: Diagnosis not present

## 2017-04-01 DIAGNOSIS — N186 End stage renal disease: Secondary | ICD-10-CM | POA: Diagnosis not present

## 2017-04-01 DIAGNOSIS — I132 Hypertensive heart and chronic kidney disease with heart failure and with stage 5 chronic kidney disease, or end stage renal disease: Secondary | ICD-10-CM | POA: Diagnosis present

## 2017-04-01 DIAGNOSIS — E1122 Type 2 diabetes mellitus with diabetic chronic kidney disease: Secondary | ICD-10-CM | POA: Diagnosis present

## 2017-04-01 DIAGNOSIS — E875 Hyperkalemia: Secondary | ICD-10-CM | POA: Diagnosis not present

## 2017-04-01 DIAGNOSIS — I700 Atherosclerosis of aorta: Secondary | ICD-10-CM | POA: Diagnosis not present

## 2017-04-01 DIAGNOSIS — Z9115 Patient's noncompliance with renal dialysis: Secondary | ICD-10-CM | POA: Diagnosis not present

## 2017-04-01 DIAGNOSIS — R748 Abnormal levels of other serum enzymes: Secondary | ICD-10-CM | POA: Diagnosis not present

## 2017-04-01 DIAGNOSIS — Z992 Dependence on renal dialysis: Secondary | ICD-10-CM | POA: Diagnosis not present

## 2017-04-01 DIAGNOSIS — K5900 Constipation, unspecified: Secondary | ICD-10-CM | POA: Diagnosis not present

## 2017-04-01 DIAGNOSIS — Z86711 Personal history of pulmonary embolism: Secondary | ICD-10-CM | POA: Diagnosis not present

## 2017-04-01 DIAGNOSIS — I161 Hypertensive emergency: Secondary | ICD-10-CM | POA: Diagnosis present

## 2017-04-01 DIAGNOSIS — Z888 Allergy status to other drugs, medicaments and biological substances status: Secondary | ICD-10-CM | POA: Diagnosis not present

## 2017-04-01 DIAGNOSIS — R0602 Shortness of breath: Secondary | ICD-10-CM | POA: Diagnosis not present

## 2017-04-07 IMAGING — DX DG SHOULDER 2+V*L*
2 series · 2 of 2 positions shown · non-contrast
Comparison: Left shoulder radiographs performed 11/28/2012

CLINICAL DATA: Chronic left shoulder pain.  Initial encounter.

EXAM:
LEFT SHOULDER - 2+ VIEW

[shoulder grashey]
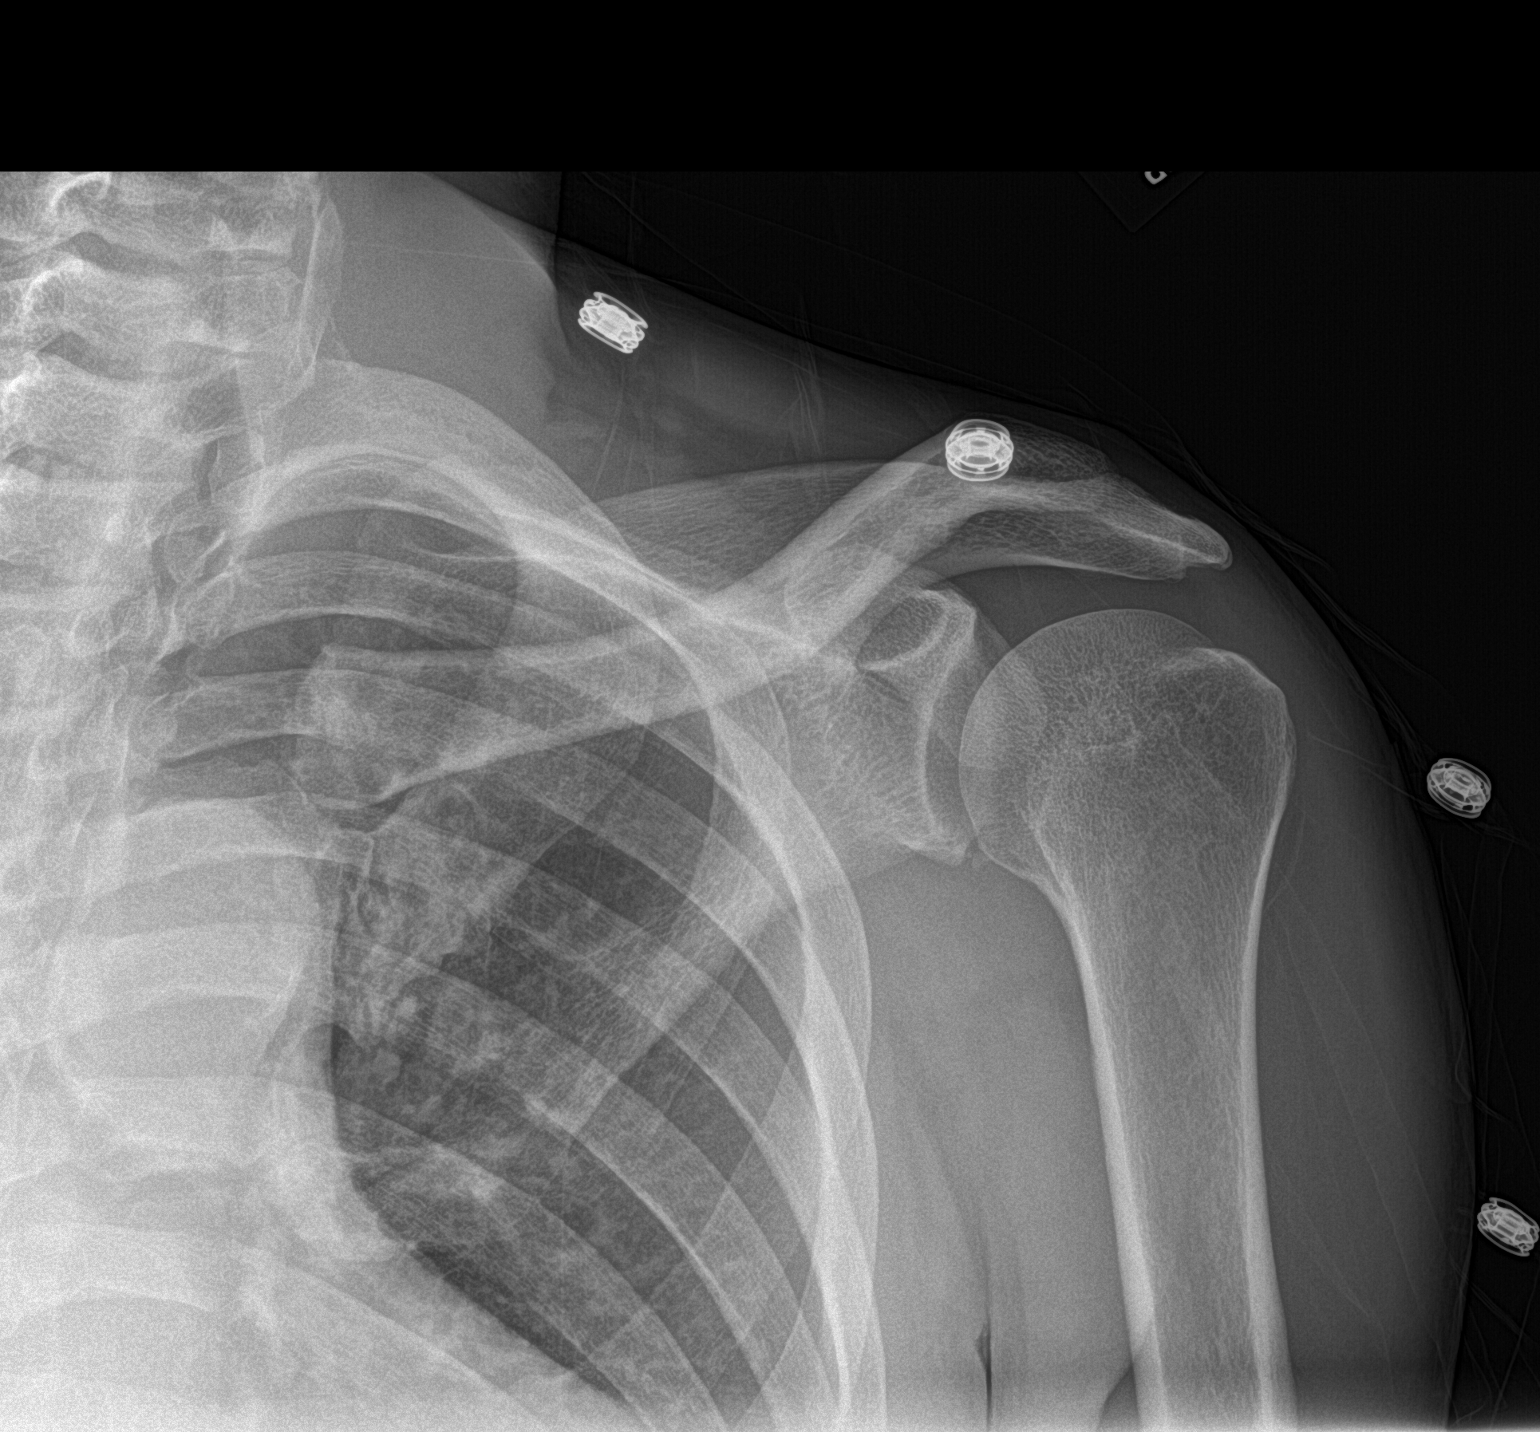

[shoulder y view]
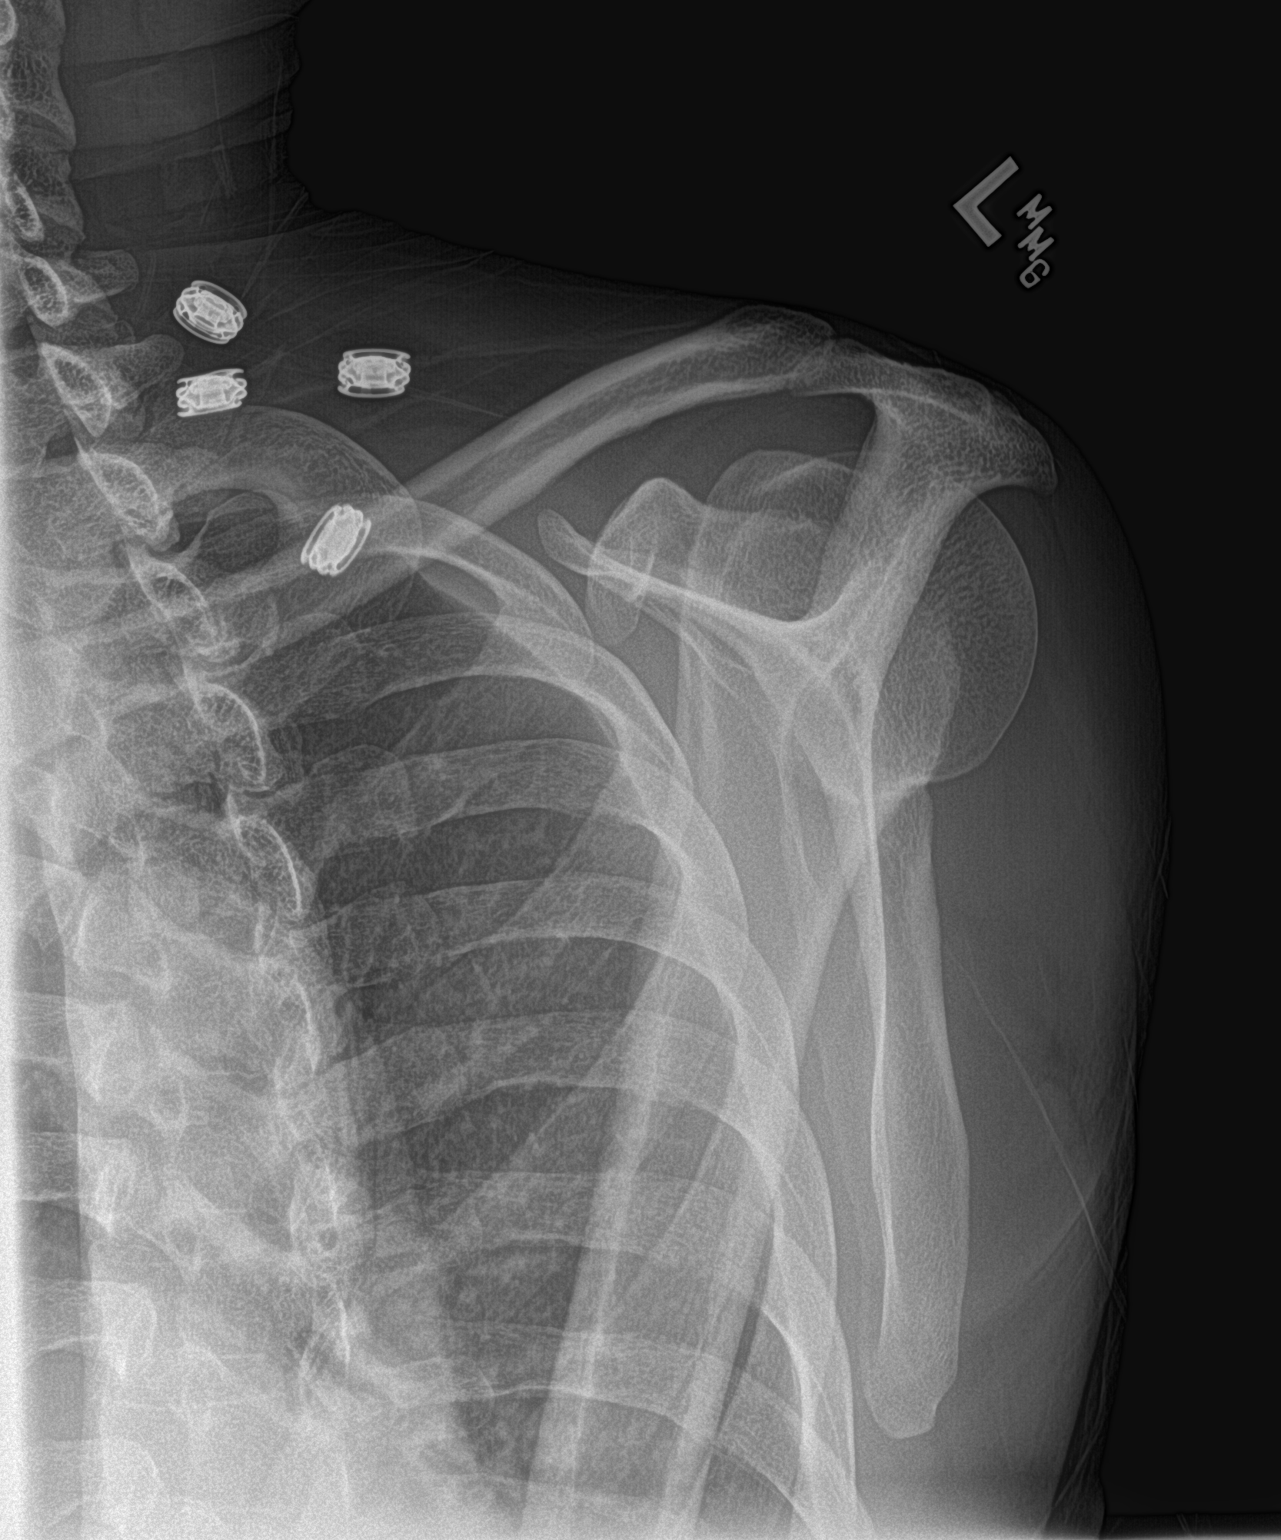

[2 of 2 positions shown; findings below may reference images not displayed]

FINDINGS: There appears to be a mildly displaced osseous Bankart lesion, which
may reflect interval dislocation, new from the prior study.

There is no evidence of dislocation at this time. The left humeral
head is seated within the glenoid fossa. The acromioclavicular joint
is unremarkable in appearance. No significant soft tissue
abnormalities are seen. The visualized portions of the left lung are
clear.
IMPRESSION: Apparent mildly displaced osseous Bankart lesion, which may reflect
dislocation since 4207. No additional evidence for fracture. MRI
could be considered to assess the extent of underlying labral
injury.

## 2017-04-09 DIAGNOSIS — I10 Essential (primary) hypertension: Secondary | ICD-10-CM | POA: Diagnosis not present

## 2017-04-09 DIAGNOSIS — R531 Weakness: Secondary | ICD-10-CM | POA: Diagnosis not present

## 2017-04-09 DIAGNOSIS — R9431 Abnormal electrocardiogram [ECG] [EKG]: Secondary | ICD-10-CM | POA: Diagnosis not present

## 2017-04-09 DIAGNOSIS — Z79899 Other long term (current) drug therapy: Secondary | ICD-10-CM | POA: Diagnosis not present

## 2017-04-09 DIAGNOSIS — Z992 Dependence on renal dialysis: Secondary | ICD-10-CM | POA: Diagnosis not present

## 2017-04-09 DIAGNOSIS — N186 End stage renal disease: Secondary | ICD-10-CM | POA: Diagnosis not present

## 2017-04-09 DIAGNOSIS — E875 Hyperkalemia: Secondary | ICD-10-CM | POA: Diagnosis not present

## 2017-04-09 DIAGNOSIS — R1010 Upper abdominal pain, unspecified: Secondary | ICD-10-CM | POA: Diagnosis not present

## 2017-04-09 DIAGNOSIS — E1122 Type 2 diabetes mellitus with diabetic chronic kidney disease: Secondary | ICD-10-CM | POA: Diagnosis not present

## 2017-04-09 DIAGNOSIS — I120 Hypertensive chronic kidney disease with stage 5 chronic kidney disease or end stage renal disease: Secondary | ICD-10-CM | POA: Diagnosis not present

## 2017-04-12 DIAGNOSIS — D631 Anemia in chronic kidney disease: Secondary | ICD-10-CM | POA: Diagnosis not present

## 2017-04-12 DIAGNOSIS — N186 End stage renal disease: Secondary | ICD-10-CM | POA: Diagnosis not present

## 2017-04-12 DIAGNOSIS — N2581 Secondary hyperparathyroidism of renal origin: Secondary | ICD-10-CM | POA: Diagnosis not present

## 2017-04-15 DIAGNOSIS — D631 Anemia in chronic kidney disease: Secondary | ICD-10-CM | POA: Diagnosis not present

## 2017-04-15 DIAGNOSIS — N2581 Secondary hyperparathyroidism of renal origin: Secondary | ICD-10-CM | POA: Diagnosis not present

## 2017-04-15 DIAGNOSIS — N186 End stage renal disease: Secondary | ICD-10-CM | POA: Diagnosis not present

## 2017-04-17 DIAGNOSIS — N2581 Secondary hyperparathyroidism of renal origin: Secondary | ICD-10-CM | POA: Diagnosis not present

## 2017-04-17 DIAGNOSIS — D631 Anemia in chronic kidney disease: Secondary | ICD-10-CM | POA: Diagnosis not present

## 2017-04-17 DIAGNOSIS — N186 End stage renal disease: Secondary | ICD-10-CM | POA: Diagnosis not present

## 2017-04-17 IMAGING — DX DG CHEST 2V
2 series · 2 of 2 positions shown · non-contrast
Comparison: 11/01/2014

CLINICAL DATA: Dyspnea and bilateral upper extremity swelling

EXAM:
CHEST  2 VIEW

[chest pa]
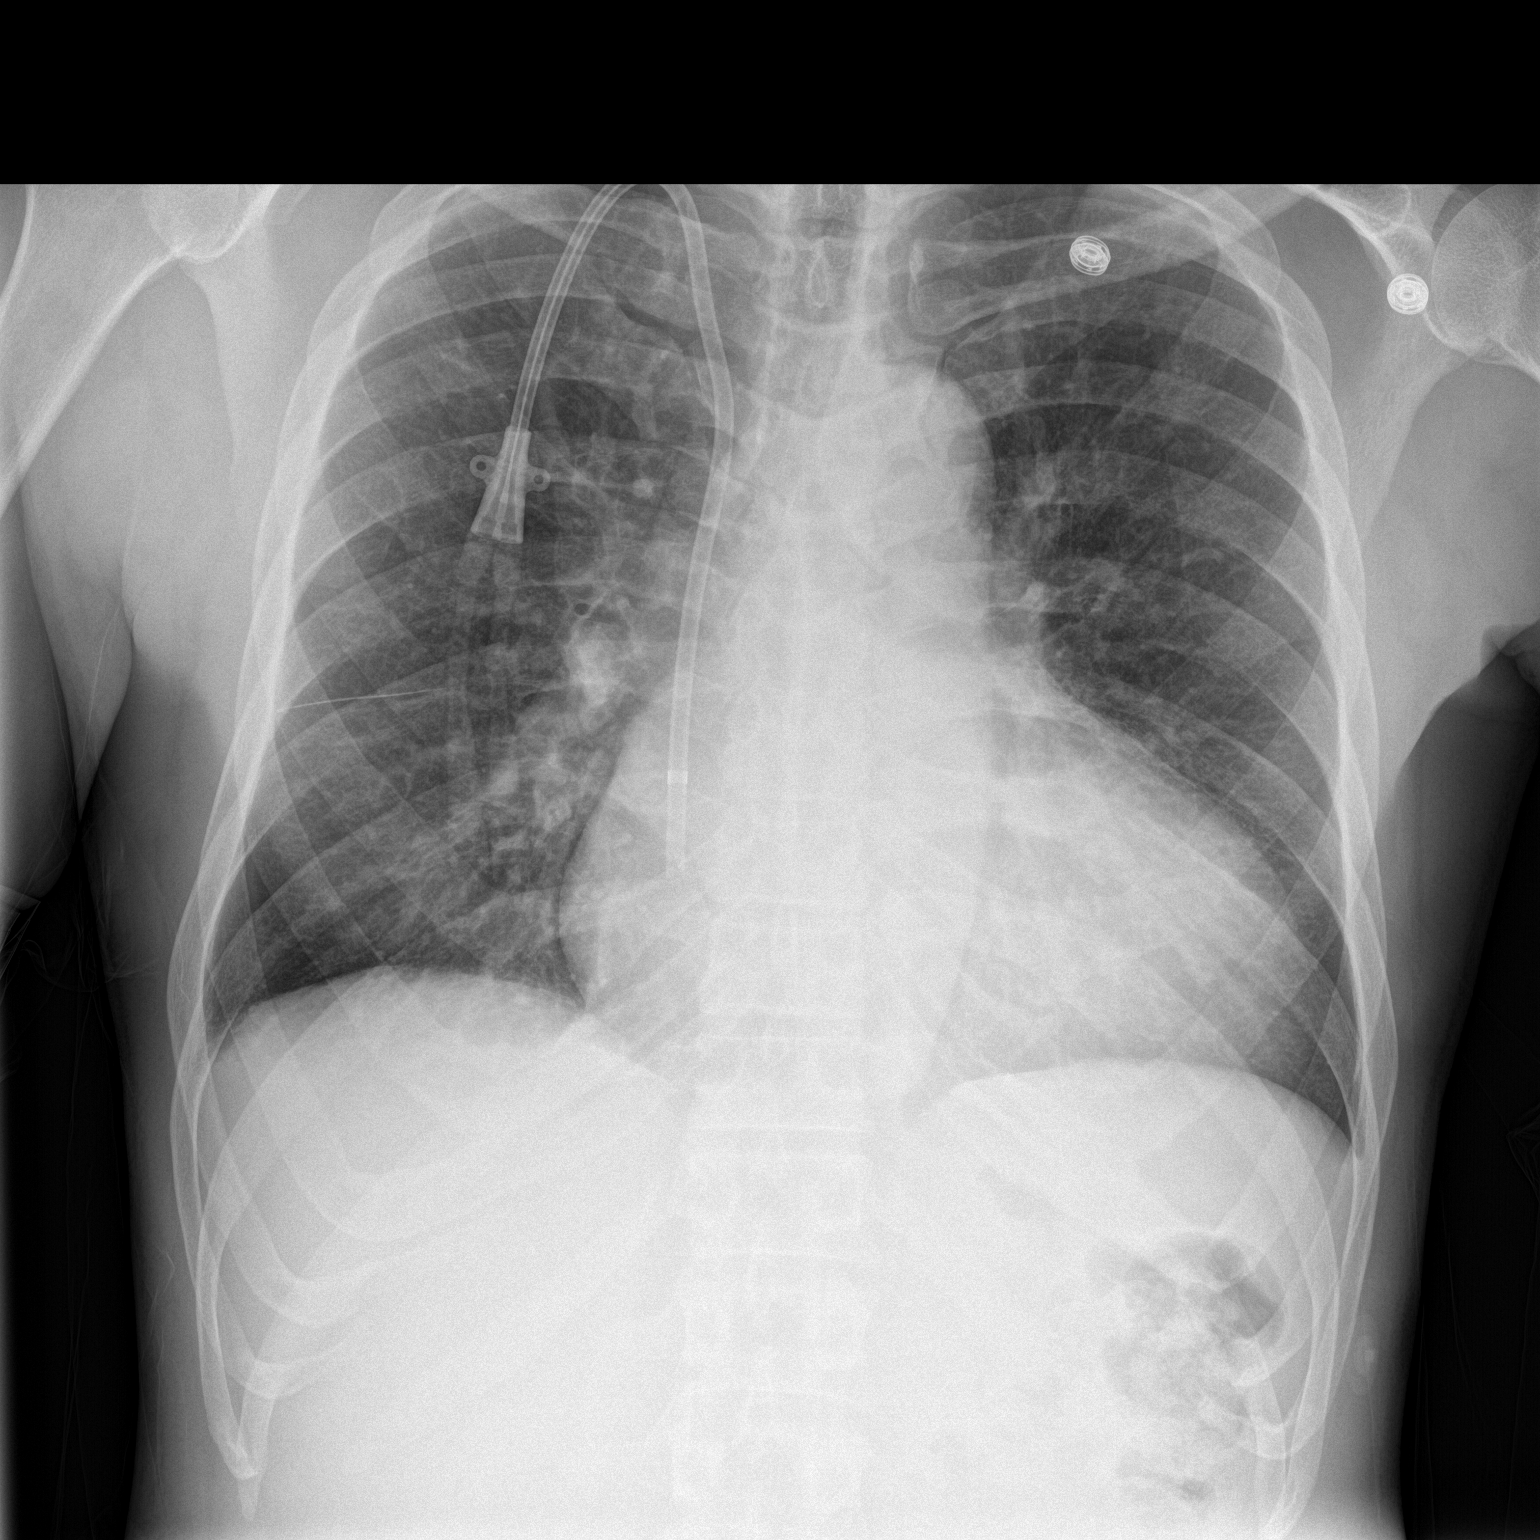

[chest lat]
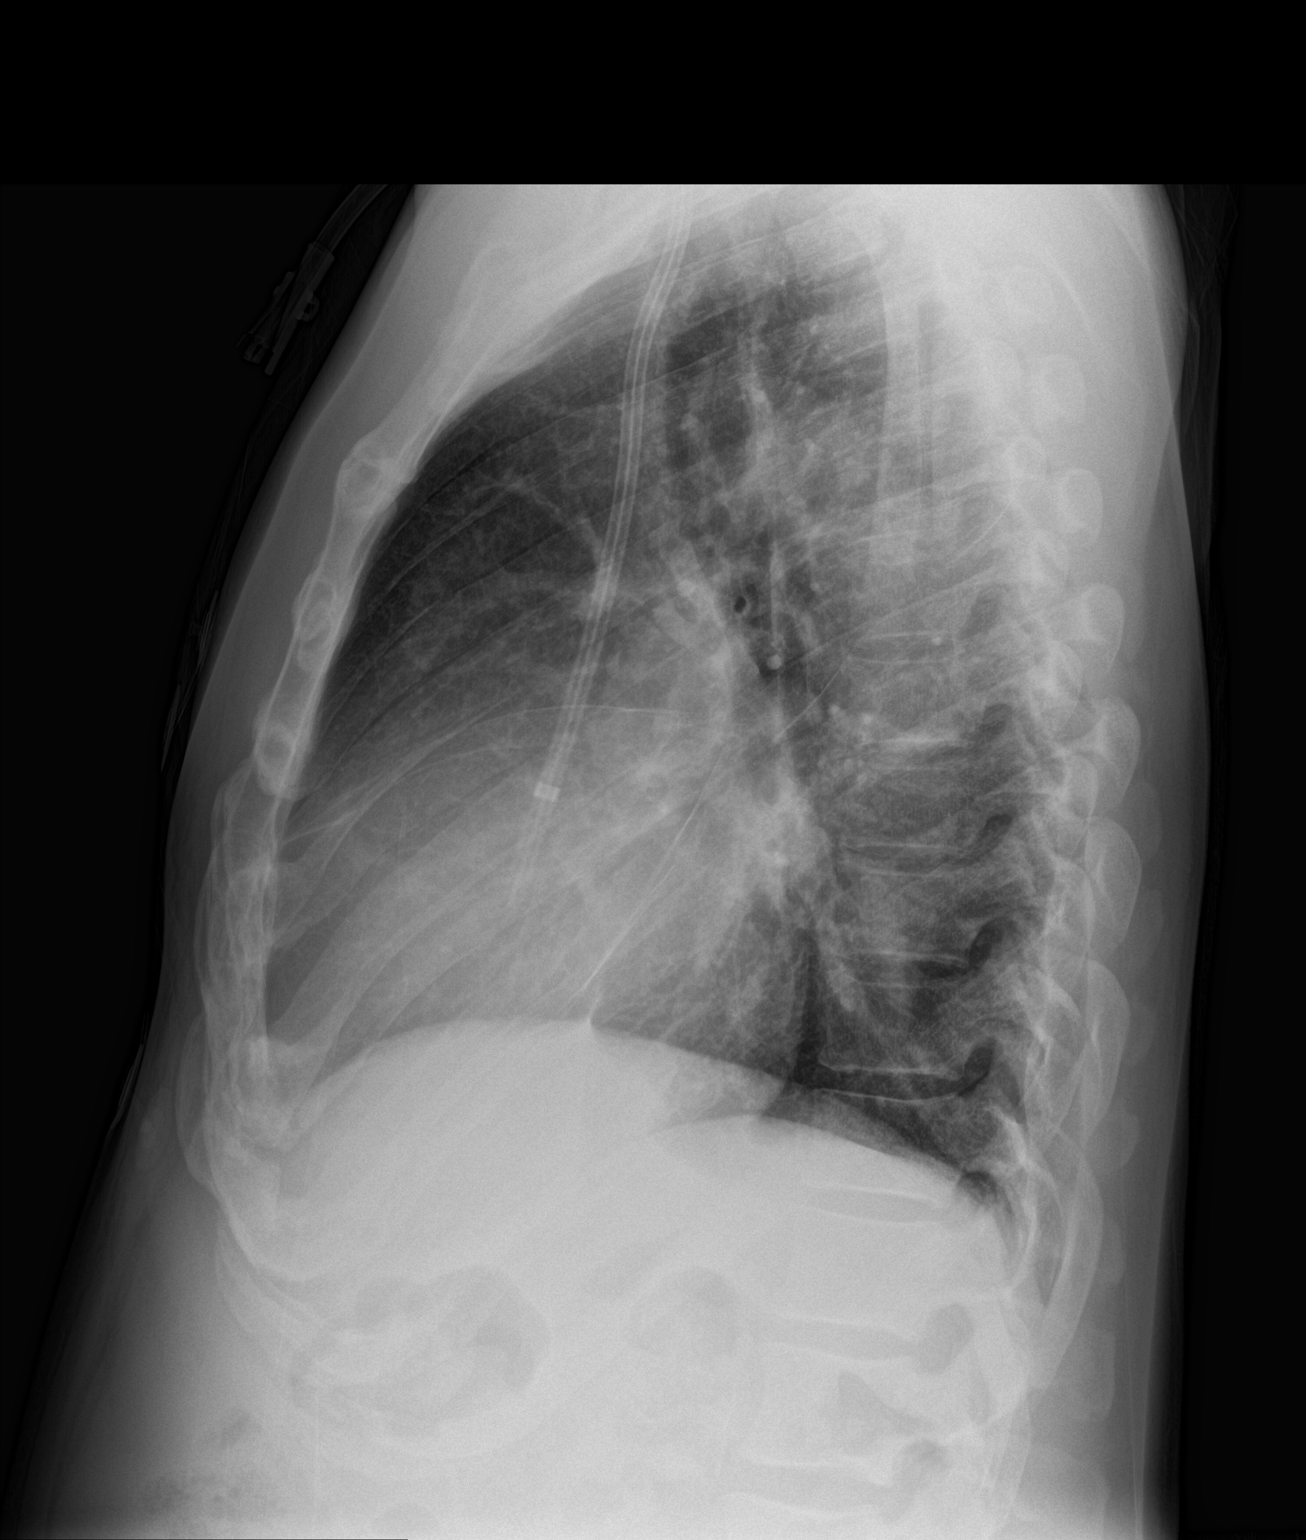

[2 of 2 positions shown; findings below may reference images not displayed]

FINDINGS: There is a right jugular central line with tip in the right atrium.
There is moderate cardiomegaly, unchanged. The lungs are clear.
There are no pleural effusions. Pulmonary vasculature is normal.
IMPRESSION: Cardiomegaly.  No acute findings.

## 2017-04-18 IMAGING — CR DG CHEST 2V
2 series · 2 of 2 positions shown · non-contrast
Comparison: 11/27/2014

CLINICAL DATA: Chest abdomen and bilateral shoulder pain, onset
tonight

EXAM:
CHEST  2 VIEW

[chest pa]
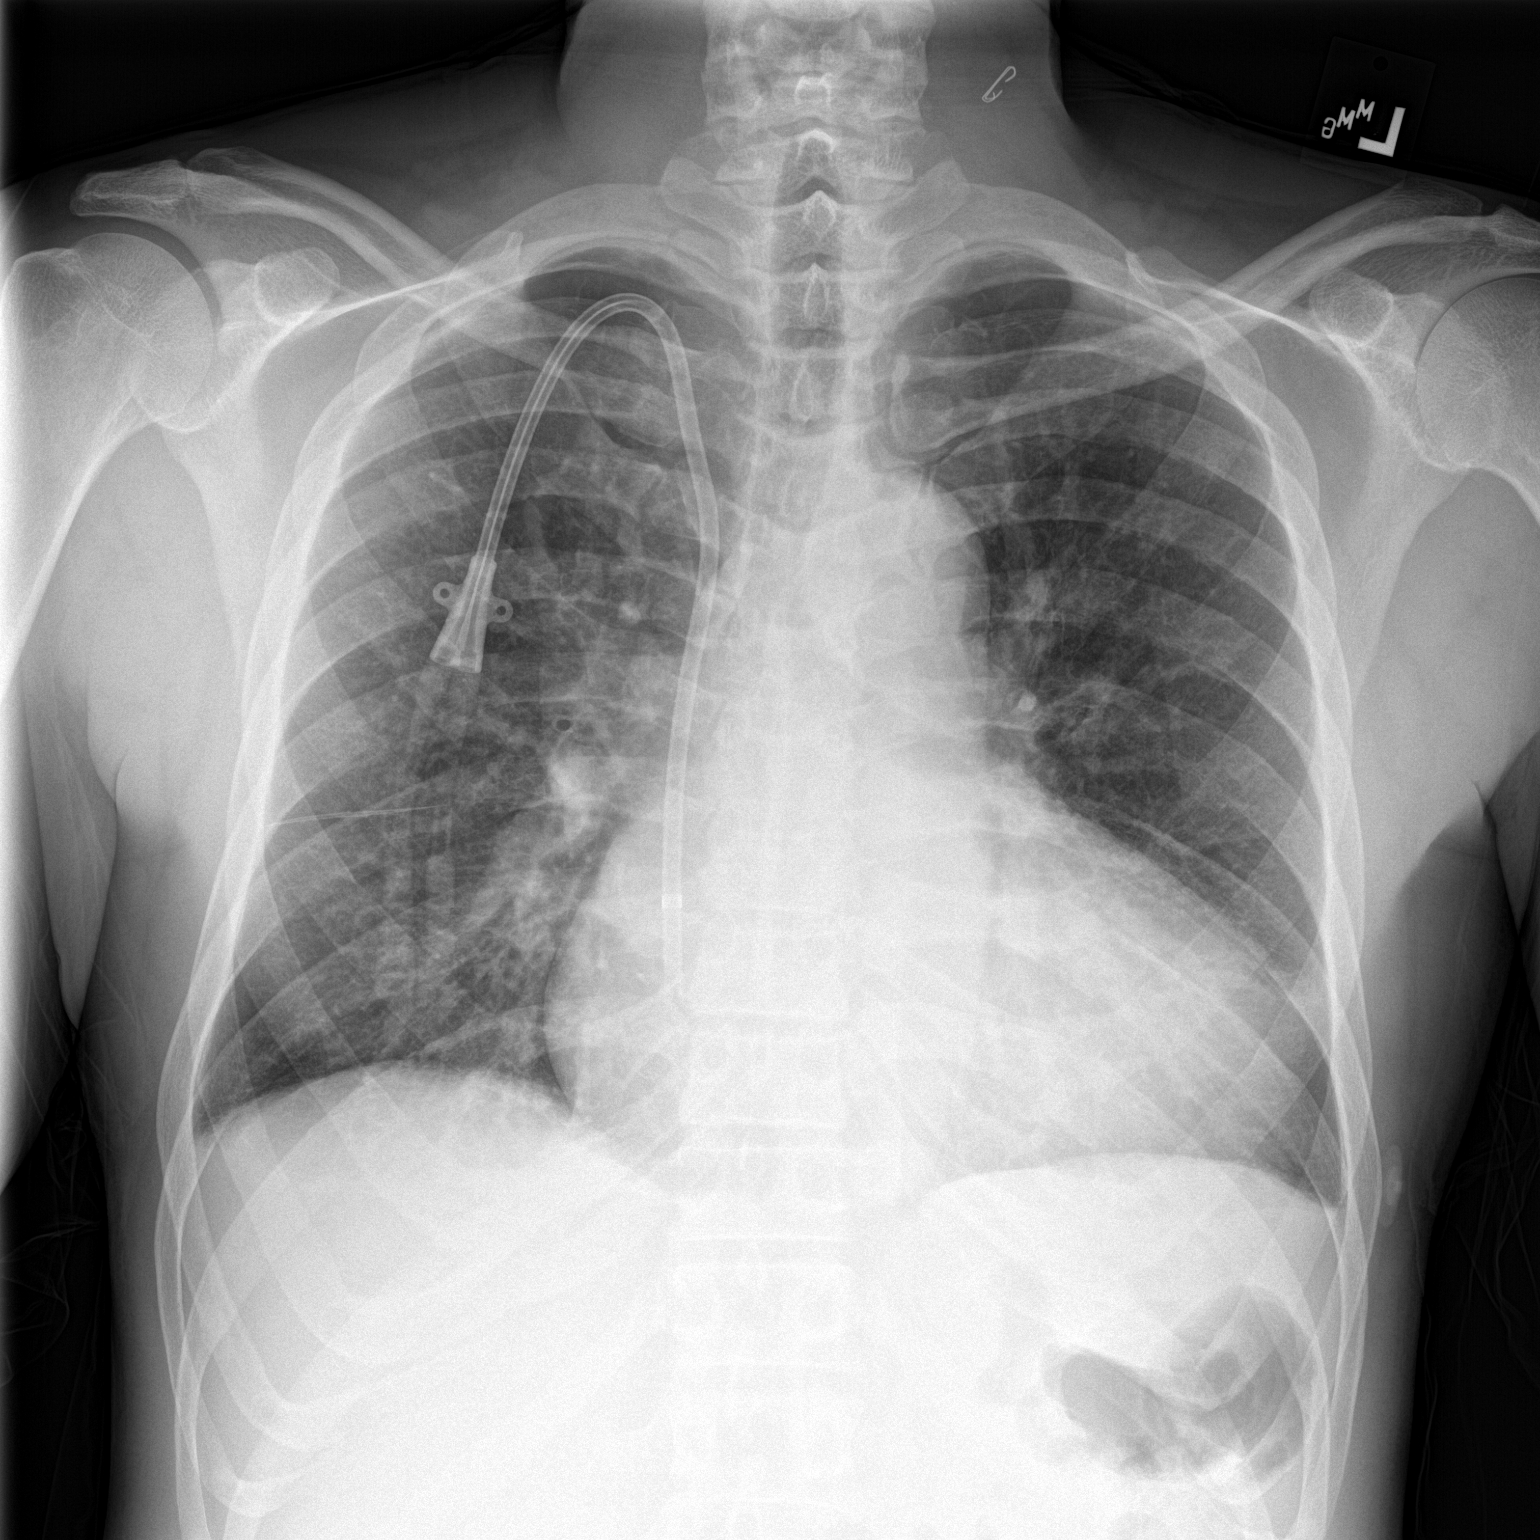

[chest lat]
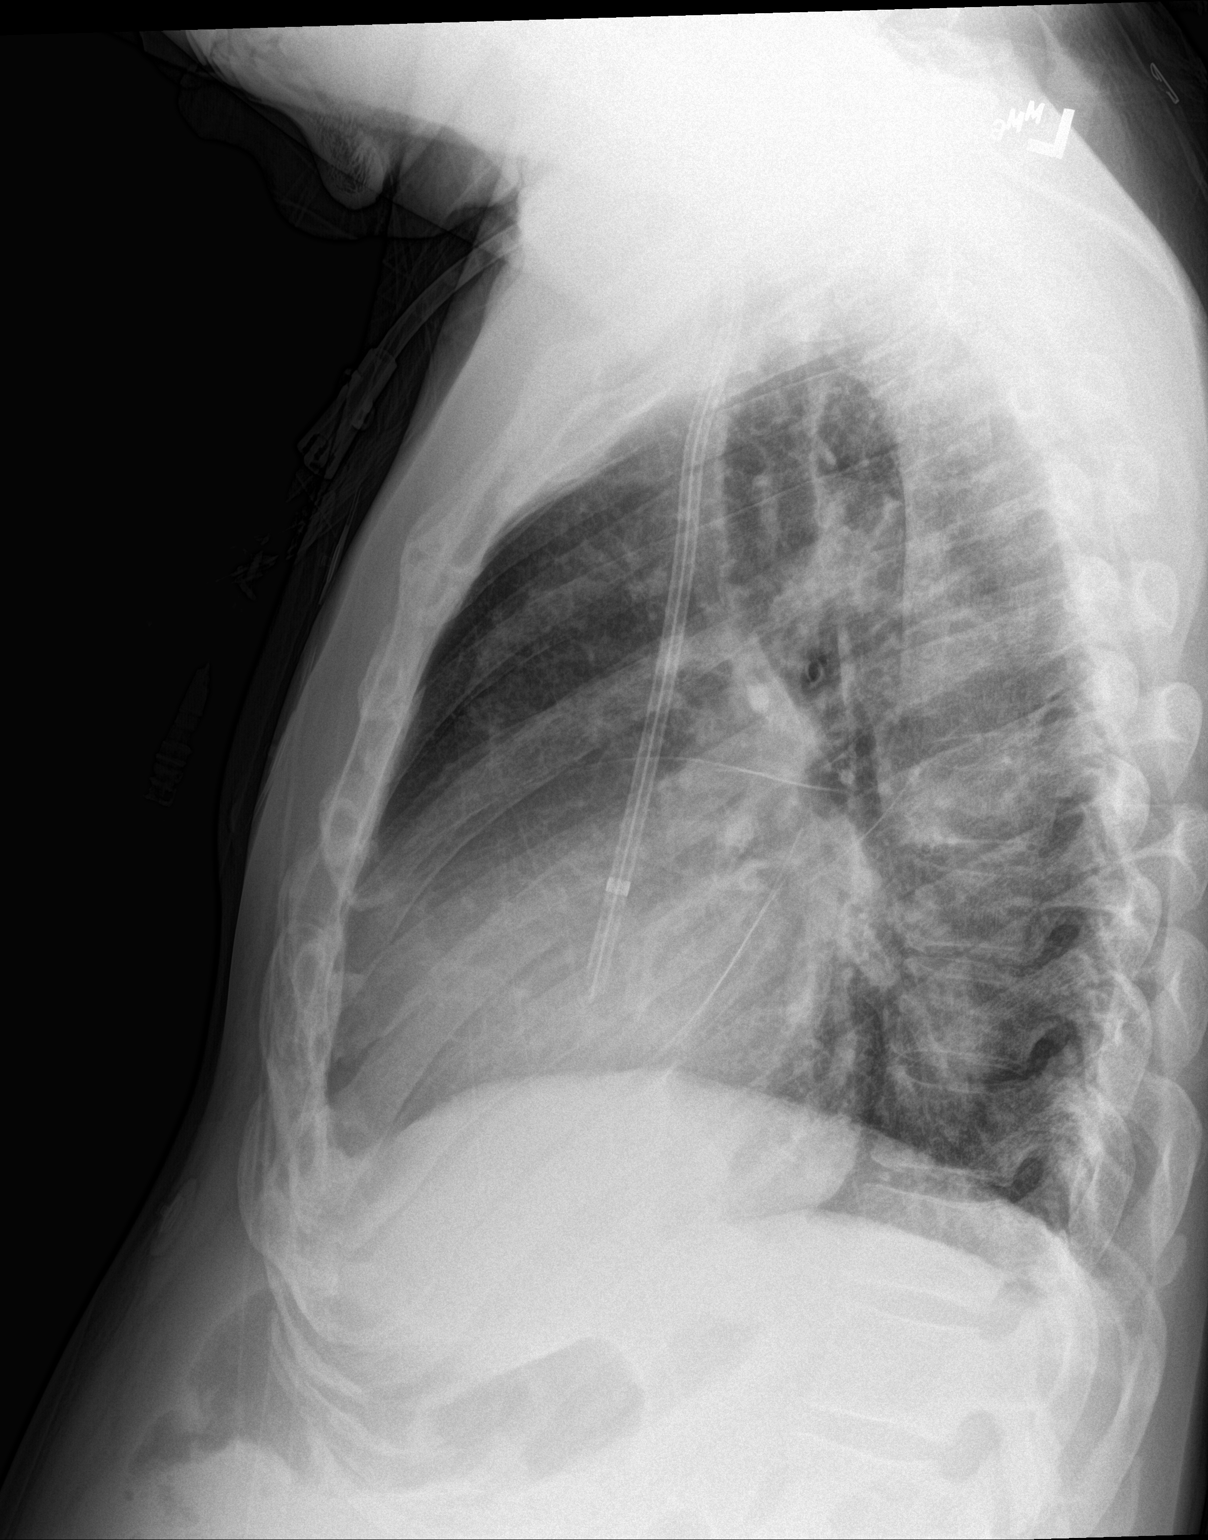

[2 of 2 positions shown; findings below may reference images not displayed]

FINDINGS: There is a right jugular central line with tip in the right atrium.
There is marked unchanged cardiomegaly. Lungs are clear. There are
no effusions. The pulmonary vasculature is normal.
IMPRESSION: Cardiomegaly.  No acute cardiopulmonary findings.

## 2017-04-19 DIAGNOSIS — R1032 Left lower quadrant pain: Secondary | ICD-10-CM | POA: Diagnosis not present

## 2017-04-19 DIAGNOSIS — Z888 Allergy status to other drugs, medicaments and biological substances status: Secondary | ICD-10-CM | POA: Diagnosis not present

## 2017-04-19 DIAGNOSIS — I120 Hypertensive chronic kidney disease with stage 5 chronic kidney disease or end stage renal disease: Secondary | ICD-10-CM | POA: Diagnosis not present

## 2017-04-19 DIAGNOSIS — R112 Nausea with vomiting, unspecified: Secondary | ICD-10-CM | POA: Diagnosis not present

## 2017-04-19 DIAGNOSIS — Z7901 Long term (current) use of anticoagulants: Secondary | ICD-10-CM | POA: Diagnosis not present

## 2017-04-19 DIAGNOSIS — Z881 Allergy status to other antibiotic agents status: Secondary | ICD-10-CM | POA: Diagnosis not present

## 2017-04-19 DIAGNOSIS — I10 Essential (primary) hypertension: Secondary | ICD-10-CM | POA: Diagnosis not present

## 2017-04-19 DIAGNOSIS — E1122 Type 2 diabetes mellitus with diabetic chronic kidney disease: Secondary | ICD-10-CM | POA: Diagnosis not present

## 2017-04-19 DIAGNOSIS — R079 Chest pain, unspecified: Secondary | ICD-10-CM | POA: Diagnosis not present

## 2017-04-19 DIAGNOSIS — R109 Unspecified abdominal pain: Secondary | ICD-10-CM | POA: Diagnosis not present

## 2017-04-19 DIAGNOSIS — Z992 Dependence on renal dialysis: Secondary | ICD-10-CM | POA: Diagnosis not present

## 2017-04-19 DIAGNOSIS — Z79899 Other long term (current) drug therapy: Secondary | ICD-10-CM | POA: Diagnosis not present

## 2017-04-19 DIAGNOSIS — Z7984 Long term (current) use of oral hypoglycemic drugs: Secondary | ICD-10-CM | POA: Diagnosis not present

## 2017-04-19 DIAGNOSIS — N186 End stage renal disease: Secondary | ICD-10-CM | POA: Diagnosis not present

## 2017-04-20 ENCOUNTER — Encounter (HOSPITAL_COMMUNITY): Payer: Self-pay | Admitting: Emergency Medicine

## 2017-04-20 ENCOUNTER — Inpatient Hospital Stay (HOSPITAL_COMMUNITY): Payer: Medicare Other

## 2017-04-20 ENCOUNTER — Inpatient Hospital Stay (HOSPITAL_COMMUNITY)
Admission: EM | Admit: 2017-04-20 | Discharge: 2017-04-22 | DRG: 640 | Disposition: A | Discharge status: Home / Self Care | Payer: Medicare Other | Attending: Internal Medicine | Admitting: Internal Medicine

## 2017-04-20 DIAGNOSIS — I429 Cardiomyopathy, unspecified: Secondary | ICD-10-CM | POA: Diagnosis present

## 2017-04-20 DIAGNOSIS — N186 End stage renal disease: Secondary | ICD-10-CM

## 2017-04-20 DIAGNOSIS — Z87891 Personal history of nicotine dependence: Secondary | ICD-10-CM

## 2017-04-20 DIAGNOSIS — R197 Diarrhea, unspecified: Secondary | ICD-10-CM

## 2017-04-20 DIAGNOSIS — IMO0002 Reserved for concepts with insufficient information to code with codable children: Secondary | ICD-10-CM | POA: Diagnosis present

## 2017-04-20 DIAGNOSIS — E785 Hyperlipidemia, unspecified: Secondary | ICD-10-CM | POA: Diagnosis present

## 2017-04-20 DIAGNOSIS — Z794 Long term (current) use of insulin: Secondary | ICD-10-CM

## 2017-04-20 DIAGNOSIS — E8889 Other specified metabolic disorders: Secondary | ICD-10-CM | POA: Diagnosis present

## 2017-04-20 DIAGNOSIS — J811 Chronic pulmonary edema: Secondary | ICD-10-CM | POA: Diagnosis not present

## 2017-04-20 DIAGNOSIS — Z992 Dependence on renal dialysis: Secondary | ICD-10-CM | POA: Diagnosis not present

## 2017-04-20 DIAGNOSIS — R1013 Epigastric pain: Secondary | ICD-10-CM | POA: Diagnosis not present

## 2017-04-20 DIAGNOSIS — E1165 Type 2 diabetes mellitus with hyperglycemia: Secondary | ICD-10-CM | POA: Diagnosis not present

## 2017-04-20 DIAGNOSIS — E1122 Type 2 diabetes mellitus with diabetic chronic kidney disease: Secondary | ICD-10-CM | POA: Diagnosis not present

## 2017-04-20 DIAGNOSIS — E875 Hyperkalemia: Secondary | ICD-10-CM | POA: Diagnosis not present

## 2017-04-20 DIAGNOSIS — F1210 Cannabis abuse, uncomplicated: Secondary | ICD-10-CM | POA: Diagnosis present

## 2017-04-20 DIAGNOSIS — E43 Unspecified severe protein-calorie malnutrition: Secondary | ICD-10-CM | POA: Diagnosis present

## 2017-04-20 DIAGNOSIS — N2581 Secondary hyperparathyroidism of renal origin: Secondary | ICD-10-CM | POA: Diagnosis present

## 2017-04-20 DIAGNOSIS — I132 Hypertensive heart and chronic kidney disease with heart failure and with stage 5 chronic kidney disease, or end stage renal disease: Secondary | ICD-10-CM | POA: Diagnosis present

## 2017-04-20 DIAGNOSIS — I700 Atherosclerosis of aorta: Secondary | ICD-10-CM | POA: Diagnosis not present

## 2017-04-20 DIAGNOSIS — I5042 Chronic combined systolic (congestive) and diastolic (congestive) heart failure: Secondary | ICD-10-CM | POA: Diagnosis present

## 2017-04-20 DIAGNOSIS — D649 Anemia, unspecified: Secondary | ICD-10-CM | POA: Diagnosis present

## 2017-04-20 DIAGNOSIS — Z6820 Body mass index (BMI) 20.0-20.9, adult: Secondary | ICD-10-CM | POA: Diagnosis not present

## 2017-04-20 DIAGNOSIS — Z86718 Personal history of other venous thrombosis and embolism: Secondary | ICD-10-CM | POA: Diagnosis not present

## 2017-04-20 DIAGNOSIS — N281 Cyst of kidney, acquired: Secondary | ICD-10-CM | POA: Diagnosis not present

## 2017-04-20 DIAGNOSIS — Z881 Allergy status to other antibiotic agents status: Secondary | ICD-10-CM | POA: Diagnosis not present

## 2017-04-20 DIAGNOSIS — E1129 Type 2 diabetes mellitus with other diabetic kidney complication: Secondary | ICD-10-CM | POA: Diagnosis present

## 2017-04-20 DIAGNOSIS — D631 Anemia in chronic kidney disease: Secondary | ICD-10-CM | POA: Diagnosis not present

## 2017-04-20 DIAGNOSIS — N261 Atrophy of kidney (terminal): Secondary | ICD-10-CM | POA: Diagnosis not present

## 2017-04-20 DIAGNOSIS — I517 Cardiomegaly: Secondary | ICD-10-CM | POA: Diagnosis not present

## 2017-04-20 DIAGNOSIS — R590 Localized enlarged lymph nodes: Secondary | ICD-10-CM | POA: Diagnosis present

## 2017-04-20 DIAGNOSIS — Z9115 Patient's noncompliance with renal dialysis: Secondary | ICD-10-CM | POA: Diagnosis not present

## 2017-04-20 DIAGNOSIS — I120 Hypertensive chronic kidney disease with stage 5 chronic kidney disease or end stage renal disease: Secondary | ICD-10-CM | POA: Diagnosis not present

## 2017-04-20 DIAGNOSIS — Z7901 Long term (current) use of anticoagulants: Secondary | ICD-10-CM | POA: Diagnosis not present

## 2017-04-20 DIAGNOSIS — R1032 Left lower quadrant pain: Secondary | ICD-10-CM | POA: Diagnosis not present

## 2017-04-20 DIAGNOSIS — R079 Chest pain, unspecified: Secondary | ICD-10-CM | POA: Diagnosis not present

## 2017-04-20 DIAGNOSIS — T8610 Unspecified complication of kidney transplant: Secondary | ICD-10-CM | POA: Diagnosis not present

## 2017-04-20 DIAGNOSIS — N2889 Other specified disorders of kidney and ureter: Secondary | ICD-10-CM | POA: Diagnosis not present

## 2017-04-20 DIAGNOSIS — Z888 Allergy status to other drugs, medicaments and biological substances status: Secondary | ICD-10-CM | POA: Diagnosis not present

## 2017-04-20 DIAGNOSIS — R112 Nausea with vomiting, unspecified: Secondary | ICD-10-CM

## 2017-04-20 LAB — CBC
HEMATOCRIT: 29 % — AB (ref 39.0–52.0)
HEMOGLOBIN: 9.7 g/dL — AB (ref 13.0–17.0)
MCH: 28.7 pg (ref 26.0–34.0)
MCHC: 33.4 g/dL (ref 30.0–36.0)
MCV: 85.8 fL (ref 78.0–100.0)
Platelets: 164 10*3/uL (ref 150–400)
RBC: 3.38 MIL/uL — ABNORMAL LOW (ref 4.22–5.81)
RDW: 15.8 % — ABNORMAL HIGH (ref 11.5–15.5)
WBC: 4.4 10*3/uL (ref 4.0–10.5)

## 2017-04-20 LAB — COMPREHENSIVE METABOLIC PANEL
ALT: 8 U/L — AB (ref 17–63)
ANION GAP: 20 — AB (ref 5–15)
AST: 14 U/L — ABNORMAL LOW (ref 15–41)
Albumin: 3.3 g/dL — ABNORMAL LOW (ref 3.5–5.0)
Alkaline Phosphatase: 133 U/L — ABNORMAL HIGH (ref 38–126)
BUN: 71 mg/dL — ABNORMAL HIGH (ref 6–20)
CHLORIDE: 94 mmol/L — AB (ref 101–111)
CO2: 21 mmol/L — AB (ref 22–32)
CREATININE: 14.04 mg/dL — AB (ref 0.61–1.24)
Calcium: 7.5 mg/dL — ABNORMAL LOW (ref 8.9–10.3)
GFR, EST AFRICAN AMERICAN: 4 mL/min — AB (ref >60)
GFR, EST NON AFRICAN AMERICAN: 3 mL/min — AB (ref >60)
Glucose, Bld: 83 mg/dL (ref 65–99)
POTASSIUM: 6.2 mmol/L — AB (ref 3.5–5.1)
SODIUM: 135 mmol/L (ref 135–145)
Total Bilirubin: 0.8 mg/dL (ref 0.3–1.2)
Total Protein: 7.9 g/dL (ref 6.5–8.1)

## 2017-04-20 LAB — LIPASE, BLOOD: LIPASE: 48 U/L (ref 11–51)

## 2017-04-20 MED ORDER — SODIUM POLYSTYRENE SULFONATE 15 GM/60ML PO SUSP
30.0000 g | Freq: Once | ORAL | Status: AC
  Administered 2017-04-20: 30 g via RECTAL
  Filled 2017-04-20: qty 120

## 2017-04-20 MED ORDER — DEXTROSE 50 % IV SOLN
1.0000 | Freq: Once | INTRAVENOUS | Status: AC
  Administered 2017-04-20: 50 mL via INTRAVENOUS
  Filled 2017-04-20: qty 50

## 2017-04-20 MED ORDER — INSULIN ASPART 100 UNIT/ML ~~LOC~~ SOLN
5.0000 [IU] | Freq: Once | SUBCUTANEOUS | Status: AC
  Administered 2017-04-20: 5 [IU] via INTRAVENOUS
  Filled 2017-04-20: qty 1

## 2017-04-20 MED ORDER — ONDANSETRON HCL 4 MG/2ML IJ SOLN
4.0000 mg | Freq: Four times a day (QID) | INTRAMUSCULAR | Status: DC | PRN
  Administered 2017-04-21: 4 mg via INTRAVENOUS
  Filled 2017-04-20: qty 2

## 2017-04-20 MED ORDER — CALCIUM GLUCONATE 10 % IV SOLN
1.0000 g | Freq: Once | INTRAVENOUS | Status: AC
  Administered 2017-04-20: 1 g via INTRAVENOUS
  Filled 2017-04-20: qty 10

## 2017-04-20 MED ORDER — HALOPERIDOL LACTATE 5 MG/ML IJ SOLN
3.0000 mg | Freq: Once | INTRAMUSCULAR | Status: AC
  Administered 2017-04-20: 3 mg via INTRAVENOUS
  Filled 2017-04-20: qty 1

## 2017-04-20 MED ORDER — LORAZEPAM 2 MG/ML IJ SOLN
1.0000 mg | Freq: Once | INTRAMUSCULAR | Status: AC
  Administered 2017-04-20: 1 mg via INTRAVENOUS
  Filled 2017-04-20: qty 1

## 2017-04-20 MED ORDER — IOPAMIDOL (ISOVUE-300) INJECTION 61%
INTRAVENOUS | Status: AC
  Filled 2017-04-20: qty 30

## 2017-04-20 MED ORDER — SODIUM BICARBONATE 8.4 % IV SOLN
50.0000 meq | Freq: Once | INTRAVENOUS | Status: AC
  Administered 2017-04-20: 50 meq via INTRAVENOUS
  Filled 2017-04-20: qty 50

## 2017-04-20 NOTE — ED Triage Notes (Signed)
Pt presents with emesis x 5-6 today, denies diarrhea. Mid abd pain onset today.  HD T,TH,S, full tx yesterday

## 2017-04-20 NOTE — H&P (Signed)
TRH H&P   Patient Demographics:    Frank [name], is a 51 y.o. male  MRN: [id number]   DOB - [birth year]  Admit Date - 04/20/2017  Outpatient Primary MD for the patient is Bowline, Doralee Albino, MD  Referring MD/NP/PA:    Billy Fischer   Outpatient Specialists:   Patient coming from:   home  Chief Complaint  Patient presents with  . Emesis      HPI:    Frank [name]  is a 51 y.o. male,  Dm2, hypertension, DV/PE, ESRD on HD (T, T, S), apparently presented to ED for evaluation of n/v, and abdominal pain.  x2 days,  "sharp" epgiastric, intermittent.  No hematemesis.  Pt denies fever, chills, diarrhea, brbpr, black stool.  Pt doesn't think anything made his abd pain better or worse.   In ED,  Bun/creat 71/14.04,  K=6.2,  Ast 14, Al 8, Alk phos 133, T. Bili 0.8,  Lipase 48, Alb 3.3,  Wbc 4.4, Hgb 9.7  EKG nsr at 70, nl axis,  T inversion in v5, v6.  No peaked T waves.   pt will be admitted for hyperkalemia and n/v, abd pain,.     Review of systems:    In addition to the HPI above,  No Fever-chills, No Headache, No changes with Vision or hearing, No problems swallowing food or Liquids, No Chest pain, Cough or Shortness of Breath, Bowel movements are regular, No Blood in stool or Urine, No dysuria, No new skin rashes or bruises, No new joints pains-aches,  No new weakness, tingling, numbness in any extremity, No recent weight gain or loss, No polyuria, polydypsia or polyphagia, No significant Mental Stressors.  A full 10 point Review of Systems was done, except as stated above, all other Review of Systems were negative.   With Past History of the following :    Past Medical History:  Diagnosis Date  . Anemia   . Anxiety   . Chronic combined systolic and diastolic CHF (congestive heart failure) (HCC)    a. EF 20-25% by echo in 08/2015 b. echo 10/2015: EF 35-40%, diffuse HK, severe  LAE, moderate RAE, small pericardial effusion  . Complication of anesthesia    itching, sore throat  . Depression   . Dialysis patient ([place])   . DVT (deep venous thrombosis) ([place]) 02/2017  . ESRD (end stage renal disease) ([place])    due to HTN per patient, followed at [facility], s/p failed kidney transplant - dialysis Tue, Th, Sat  . Hyperkalemia 12/2015  . Hypertension   . Junctional rhythm    a. noted in 08/2015: hyperkalemic at that time  b. 12/2015: presented in junctional rhythm w/ K+ of 6.6. Resolved with improvement of K+ levels.  . Nonischemic cardiomyopathy ([place])    a. 08/2014: cath showing minimal CAD, but tortuous arteries noted.   . Personal history of DVT (deep vein thrombosis)/  PE 05/26/2016   In Oct 2015 had small subsemental LUL PE w/o DVT (LE dopplers neg) and was felt to be HD cath related, treated w coumadin.  IN May 2016 had small vein DVT (acute/subacute) in the R basilic/ brachial veins of the RUE, resumed on coumadin.  Had R sided HD cath at that time.    . Renal insufficiency   . Shortness of breath   . Type II diabetes mellitus (HCC)    No history per patient, but remains under history as A1c would not be accurate given on dialysis      Past Surgical History:  Procedure Laterality Date  . CAPD INSERTION    . CAPD REMOVAL    . INGUINAL HERNIA REPAIR Right 02/14/2015   Procedure: REPAIR INCARCERATED RIGHT INGUINAL HERNIA;  Surgeon: [name], MD;  Location: [place];  Service: General;  Laterality: Right;  . INSERTION OF DIALYSIS CATHETER Right 09/23/2015   Procedure: exchange of Right internal Dialysis Catheter.;  Surgeon: [name], MD;  Location: [place];  Service: Vascular;  Laterality: Right;  . IR GENERIC HISTORICAL  07/16/2016   IR US GUIDE VASC ACCESS LEFT 07/16/2016 Corrie Mckusick, DO [facility]  . IR GENERIC HISTORICAL Left 07/16/2016   IR THROMBECTOMY AV FISTULA W/THROMBOLYSIS/PTA INC/SHUNT/IMG LEFT 07/16/2016 Corrie Mckusick, DO [facility]  . KIDNEY  RECEIPIENT  2006   failed and started HD in March 2014  . LEFT HEART CATHETERIZATION WITH CORONARY ANGIOGRAM N/A 09/02/2014   Procedure: LEFT HEART CATHETERIZATION WITH CORONARY ANGIOGRAM;  Surgeon: [name], MD;  Location: [facility] CATH LAB;  Service: Cardiovascular;  Laterality: N/A;      Social History:     Social History  Substance Use Topics  . Smoking status: Former Smoker    Packs/day: 0.00    Years: 1.00    Types: Cigarettes  . Smokeless tobacco: Never Used     Comment: quit Jan 2014  . Alcohol use No     Lives - at home  Mobility - walks by self   Family History :     Family History  Problem Relation Age of Onset  . Hypertension Other       Home Medications:   Prior to Admission medications   Medication Sig Start Date End Date Taking? Authorizing Provider  amLODipine (NORVASC) 10 MG tablet Take 1 tablet (10 mg total) by mouth at bedtime. 12/01/16   Rice, Resa Miner, MD  cloNIDine (CATAPRES - DOSED IN MG/24 HR) 0.3 mg/24hr patch Place 0.3 mg onto the skin once a week. On Wednesday  12/31/16   [provider]  enoxaparin (LOVENOX) 60 MG/0.6ML injection Inject 0.6 mLs (60 mg total) into the skin daily. 03/23/17 03/25/17  Fatima Blank, MD  hydrALAZINE (APRESOLINE) 100 MG tablet Take 1 tablet (100 mg total) by mouth 2 (two) times daily. 01/07/17   Ledell Noss, MD  isosorbide mononitrate (IMDUR) 60 MG 24 hr tablet Take 1 tablet (60 mg total) by mouth daily. 01/08/17   Ledell Noss, MD  metoCLOPramide (REGLAN) 5 MG tablet Take 1 tablet (5 mg total) by mouth every 8 (eight) hours as needed for nausea. 12/01/16   Collier Salina, MD  ondansetron (ZOFRAN ODT) 8 MG disintegrating tablet 15m ODT q4 hours prn nausea 03/30/17   Mesner, JCorene Cornea MD  pantoprazole (PROTONIX) 40 MG tablet Take 1 tablet (40 mg total) by mouth 2 (two) times daily before a meal. 12/01/16   Rice, CResa Miner MD  sevelamer carbonate (  RENVELA) 800 MG tablet Take 1 tablet (800 mg total) by  mouth 3 (three) times daily with meals. Patient taking differently: Take 800 mg by mouth 3 (three) times daily with meals. 800 mg as needed with snack 01/06/17   Ledell Noss, MD  traMADol (ULTRAM) 50 MG tablet Take 1 tablet (50 mg total) by mouth every 12 (twelve) hours as needed for moderate pain or severe pain. 12/01/16   Collier Salina, MD  warfarin (COUMADIN) 5 MG tablet Take 7.5 mg of Coumadin daily for the next 2 days. After that take 5 mg daily, unless the physicians at the dialysis center one to keep by mouth and a 7.5 mg. 03/30/17   Mesner, Corene Cornea, MD     Allergies:     Allergies  Allergen Reactions  . Butalbital-Apap-Caffeine Shortness Of Breath, Swelling and Other (See Comments)    Swelling in throat  . Ferrlecit [Na Ferric Gluc Cplx In Sucrose] Shortness Of Breath, Swelling and Other (See Comments)    Swelling in throat, tolerates Venofor  . Minoxidil Shortness Of Breath  . Tylenol [Acetaminophen] Anaphylaxis and Swelling  . Darvocet [Propoxyphene N-Acetaminophen] Hives     Physical Exam:   Vitals  Blood pressure (!) 156/110, pulse 65, temperature 98.4 F (36.9 C), temperature source Oral, resp. rate 18, height _0  (1.88 m), weight 73.9 kg (163 lb), SpO2 99 %.   1. General lying in bed in NAD,    2. Normal affect and insight, Not Suicidal or Homicidal, Awake Alert, Oriented X 3.  3. No F.N deficits, ALL C.Nerves Intact, Strength 5/5 all 4 extremities, Sensation intact all 4 extremities, Plantars down going.  4. Ears and Eyes appear Normal, Conjunctivae clear, PERRLA. Moist Oral Mucosa.  5. Supple Neck, No JVD, No cervical lymphadenopathy appriciated, No Carotid Bruits.  6. Symmetrical Chest wall movement, Good air movement bilaterally, CTAB.  7. RRR, No Gallops, Rubs or Murmurs, No Parasternal Heave.  8. Positive Bowel Sounds, Abdomen Soft, No tenderness, No organomegaly appriciated,No rebound -guarding or rigidity.  9.  No Cyanosis, Normal Skin Turgor, No  Skin Rash or Bruise.  10. Good muscle tone,  joints appear normal , no effusions, Normal ROM.  11. No Palpable Lymph Nodes in Neck or Axillae  L avf   Data Review:    CBC  Recent Labs Lab 04/20/17 1921  WBC 4.4  HGB 9.7*  HCT 29.0*  PLT 164  MCV 85.8  MCH 28.7  MCHC 33.4  RDW 15.8*   ------------------------------------------------------------------------------------------------------------------  Chemistries   Recent Labs Lab 04/20/17 1921  NA 135  K 6.2*  CL 94*  CO2 21*  GLUCOSE 83  BUN 71*  CREATININE 14.04*  CALCIUM 7.5*  AST 14*  ALT 8*  ALKPHOS 133*  BILITOT 0.8   ------------------------------------------------------------------------------------------------------------------ estimated creatinine clearance is 6.4 mL/min (A) (by C-G formula based on SCr of 14.04 mg/dL (H)). ------------------------------------------------------------------------------------------------------------------ No results for input(s): TSH, T4TOTAL, T3FREE, THYROIDAB in the last 72 hours.  Invalid input(s): FREET3  Coagulation profile No results for input(s): INR, PROTIME in the last 168 hours. ------------------------------------------------------------------------------------------------------------------- No results for input(s): DDIMER in the last 72 hours. -------------------------------------------------------------------------------------------------------------------  Cardiac Enzymes No results for input(s): CKMB, TROPONINI, MYOGLOBIN in the last 168 hours.  Invalid input(s): CK ------------------------------------------------------------------------------------------------------------------    Component Value Date/Time   BNP >4,500.0 (H) 09/24/2015 0324     ---------------------------------------------------------------------------------------------------------------  Urinalysis    Component Value Date/Time   COLORURINE YELLOW 10/18/2013 [place] 10/18/2013 0419  LABSPEC 1.008 10/18/2013 0419   PHURINE 8.5 (H) 10/18/2013 0419   GLUCOSEU 100 (A) 10/18/2013 0419   HGBUR TRACE (A) 10/18/2013 0419   BILIRUBINUR NEGATIVE 10/18/2013 0419   KETONESUR NEGATIVE 10/18/2013 0419   PROTEINUR 100 (A) 10/18/2013 0419   UROBILINOGEN 0.2 10/18/2013 0419   NITRITE NEGATIVE 10/18/2013 0419   LEUKOCYTESUR NEGATIVE 10/18/2013 0419    ----------------------------------------------------------------------------------------------------------------   Imaging Results:    No results found.   Assessment & Plan:    Principal Problem:   Hyperkalemia Active Problems:   Nausea & vomiting   DM (diabetes mellitus), type 2, uncontrolled, with renal complications (HCC)   ESRD on hemodialysis (HCC)   Abdominal pain    N/v  Con protonix zofran 61m iv q6h prn  Abdominal pain Check CT scan abd/pelvis  Hyperkalemia Calcium gluconate Sodium bicarbonate Kayexalate Check bmp Nephrology consulted by ED, appreciate input  ESRD on HD T,T, S Check cmp in am  Anemia Repeat cbc in am  DVT/PE Coumadin pharmacy to dose  Hypertension Cont current bp medication  Dm2 fsbs q4h ISS  Protein calorie malnutrition,  Severe prostat   DVT Prophylaxis coumadin AM Labs Ordered, also please review Full Orders  Family Communication: Admission, patients condition and plan of care including tests being ordered have been discussed with the patient  who indicate understanding and agree with the plan and Code Status.  Code Status FULL CODE  Likely DC to  home  Condition GUARDED   Consults called: nephrology by ED  Admission status: inpatient  Time spent in minutes : 45   JJani GravelM.D on 04/20/2017 at 9:58 PM  Between 7am to 7pm - Pager - [phone] After 7pm go to [email or web address] - password [email or web address] - Office  [phone]

## 2017-04-20 NOTE — ED Notes (Signed)
This EMT attempted to collect a urine sample from pt. Pt informed this EMT he no longer produces urine. RN and phlebotomy notified.

## 2017-04-20 NOTE — ED Notes (Signed)
Pt refused CT & XRay stating he was "too anxious and itchy". Admitting notified, waiting on call back.

## 2017-04-20 NOTE — ED Notes (Signed)
Pt in CT, unable to obtain blood work.

## 2017-04-20 NOTE — ED Provider Notes (Signed)
[facility] DEPT Provider Note   CSN: [id number] Arrival date & time: 04/20/17  1907     History   Chief Complaint Chief Complaint  Patient presents with  . Emesis    HPI Frank [name] is a 51 y.o. male.  HPI  51 year old male with a history of CHF, DVT, DM hypertension, hyperlipidemia, ESRD on dialysis Tuesday Thursday Saturday, with most recent session yesterday, who presents with concern for abdominal pain, nausea, vomiting and concern that his potassium may be high.  Patient reports he believes his symptoms indicate that his potassium is elevated. Reports he's had tingling in his bilateral hands and knees which is consistent with prior times his potassium has been high.  Went to dialysis yesterday, full session.  Today developed abdominal pain, epigastric and LUQ. Has had nausea and vomiting at least 6 episodes. Unable to keep anything down.  Had loose stool today, is passing flatus. Has hx of prior abdominal surgeries. Has had similar pain to this in the past.  No longer makes urine. No chest pain, no cough, no other symptoms.   Past Medical History:  Diagnosis Date  . Anemia   . Anxiety   . Chronic combined systolic and diastolic CHF (congestive heart failure) (HCC)    a. EF 20-25% by echo in 08/2015 b. echo 10/2015: EF 35-40%, diffuse HK, severe LAE, moderate RAE, small pericardial effusion  . Complication of anesthesia    itching, sore throat  . Depression   . Dialysis patient ([place])   . DVT (deep venous thrombosis) ([place]) 02/2017  . ESRD (end stage renal disease) ([place])    due to HTN per patient, followed at [facility], s/p failed kidney transplant - dialysis Tue, Th, Sat  . Hyperkalemia 12/2015  . Hypertension   . Junctional rhythm    a. noted in 08/2015: hyperkalemic at that time  b. 12/2015: presented in junctional rhythm w/ K+ of 6.6. Resolved with improvement of K+ levels.  . Nonischemic cardiomyopathy ([place])    a. 08/2014: cath showing minimal CAD, but tortuous  arteries noted.   . Personal history of DVT (deep vein thrombosis)/ PE 05/26/2016   In Oct 2015 had small subsemental LUL PE w/o DVT (LE dopplers neg) and was felt to be HD cath related, treated w coumadin.  IN May 2016 had small vein DVT (acute/subacute) in the R basilic/ brachial veins of the RUE, resumed on coumadin.  Had R sided HD cath at that time.    . Renal insufficiency   . Shortness of breath   . Type II diabetes mellitus (HCC)    No history per patient, but remains under history as A1c would not be accurate given on dialysis    Patient Active Problem List   Diagnosis Date Noted  . Acute DVT (deep venous thrombosis) ([place]) 03/13/2017  . DVT (deep venous thrombosis) ([place]) 03/11/2017  . Anemia 02/24/2017  . Volume overload 01/13/2017  . Aortic atherosclerosis ([place]) 01/05/2017  . Abdominal pain 08/04/2016  . Uremia 06/07/2016  . Hyperkalemia 05/29/2016  . GERD (gastroesophageal reflux disease) 05/29/2016  . Personal history of DVT (deep vein thrombosis)/ PE 05/26/2016  . Nonischemic cardiomyopathy ([place]) 01/09/2016  . Bilateral low back pain without sciatica   . Renal cyst, left 10/30/2015  . Constipation by delayed colonic transit 10/30/2015  . Acute on chronic combined systolic and diastolic congestive heart failure ([place]) 09/23/2015  . Chest pain 09/08/2015  . Adjustment disorder with mixed anxiety and depressed mood 08/20/2015  . Essential hypertension  01/02/2015  . Dyslipidemia   . Malignant hypertension 11/29/2014  . ESRD on hemodialysis ([place])   . Acute pulmonary edema (HCC)   . DM (diabetes mellitus), type 2, uncontrolled, with renal complications ([place])   . Complex sleep apnea syndrome 05/05/2014  . Anemia of chronic kidney failure 06/24/2013  . Nausea & vomiting 06/24/2013    Past Surgical History:  Procedure Laterality Date  . CAPD INSERTION    . CAPD REMOVAL    . INGUINAL HERNIA REPAIR Right 02/14/2015   Procedure: REPAIR INCARCERATED RIGHT INGUINAL HERNIA;   Surgeon: [name], MD;  Location: [facility];  Service: General;  Laterality: Right;  . INSERTION OF DIALYSIS CATHETER Right 09/23/2015   Procedure: exchange of Right internal Dialysis Catheter.;  Surgeon: [name], MD;  Location: [facility];  Service: Vascular;  Laterality: Right;  . IR GENERIC HISTORICAL  07/16/2016   IR US GUIDE VASC ACCESS LEFT 07/16/2016 Corrie Mckusick, DO [facility]-INTERV RAD  . IR GENERIC HISTORICAL Left 07/16/2016   IR THROMBECTOMY AV FISTULA W/THROMBOLYSIS/PTA INC/SHUNT/IMG LEFT 07/16/2016 Corrie Mckusick, DO [facility]INTER[facility]  . KIDNEY RECEIPIENT  2006   failed and started HD in March 2014  . LEFT HEART CATHETERIZATION WITH CORONARY ANGIOGRAM N/A 09/02/2014   Procedure: LEFT HEART CATHETERIZATION WITH CORONARY ANGIOGRAM;  Surgeon: [name], MD;  Location: [facility] CATH LAB;  Service: Cardiovascular;  Laterality: N/A;       Home Medications    Prior to Admission medications   Medication Sig Start Date End Date Taking? Authorizing Provider  amLODipine (NORVASC) 10 MG tablet Take 1 tablet (10 mg total) by mouth at bedtime. 12/01/16  Yes Rice, Resa Miner, MD  cloNIDine (CATAPRES - DOSED IN MG/24 HR) 0.3 mg/24hr patch Place 0.3 mg onto the skin once a week. On Wednesday  12/31/16  Yes [provider]  hydrALAZINE (APRESOLINE) 100 MG tablet Take 1 tablet (100 mg total) by mouth 2 (two) times daily. 01/07/17  Yes Ledell Noss, MD  isosorbide mononitrate (IMDUR) 60 MG 24 hr tablet Take 1 tablet (60 mg total) by mouth daily. 01/08/17  Yes Ledell Noss, MD  metoCLOPramide (REGLAN) 5 MG tablet Take 1 tablet (5 mg total) by mouth every 8 (eight) hours as needed for nausea. 12/01/16  Yes Rice, Resa Miner, MD  ondansetron (ZOFRAN ODT) 8 MG disintegrating tablet 53m ODT q4 hours prn nausea 03/30/17  Yes Mesner, JCorene Cornea MD  pantoprazole (PROTONIX) 40 MG tablet Take 1 tablet (40 mg total) by mouth 2 (two) times daily before a meal. 12/01/16  Yes Rice, CResa Miner MD  sevelamer carbonate  (RENVELA) 800 MG tablet Take 1 tablet (800 mg total) by mouth 3 (three) times daily with meals. Patient taking differently: Take 800 mg by mouth 3 (three) times daily with meals. 800 mg as needed with snack 01/06/17  Yes BLedell Noss MD  traMADol (ULTRAM) 50 MG tablet Take 1 tablet (50 mg total) by mouth every 12 (twelve) hours as needed for moderate pain or severe pain. 12/01/16  Yes Rice, CResa Miner MD  warfarin (COUMADIN) 5 MG tablet Take 7.5 mg of Coumadin daily for the next 2 days. After that take 5 mg daily, unless the physicians at the dialysis center one to keep by mouth and a 7.5 mg. Patient taking differently: Take 7.5 mg by mouth daily.  03/30/17  Yes Mesner, JCorene Cornea MD    Family History Family History  Problem Relation Age of Onset  . Hypertension Other     Social History  Social History  Substance Use Topics  . Smoking status: Former Smoker    Packs/day: 0.00    Years: 1.00    Types: Cigarettes  . Smokeless tobacco: Never Used     Comment: quit Jan 2014  . Alcohol use No     Allergies   Butalbital-apap-caffeine; Ferrlecit [na ferric gluc cplx in sucrose]; Minoxidil; Tylenol [acetaminophen]; and Darvocet [propoxyphene n-acetaminophen]   Review of Systems Review of Systems  Constitutional: Negative for fever.  HENT: Negative for sore throat.   Eyes: Negative for visual disturbance.  Respiratory: Negative for shortness of breath.   Cardiovascular: Negative for chest pain.  Gastrointestinal: Positive for abdominal pain, diarrhea (loose stool), nausea and vomiting.  Genitourinary: Negative for difficulty urinating (does not make urine).  Musculoskeletal: Negative for back pain and neck stiffness.  Skin: Negative for rash.  Neurological: Negative for syncope and headaches.     Physical Exam Updated Vital Signs BP (!) 194/110   Pulse 83   Temp 98.4 F (36.9 C) (Oral)   Resp 15   Ht _0  (1.88 m)   Wt 73.9 kg (163 lb)   SpO2 95%   BMI 20.93 kg/m   Physical  Exam  Constitutional: He is oriented to person, place, and time. He appears well-developed and well-nourished. No distress.  HENT:  Head: Normocephalic and atraumatic.  Eyes: Conjunctivae and EOM are normal.  Neck: Normal range of motion.  Cardiovascular: Normal rate, regular rhythm, normal heart sounds and intact distal pulses.  Exam reveals no gallop and no friction rub.   No murmur heard. Pulmonary/Chest: Effort normal and breath sounds normal. No respiratory distress. He has no wheezes. He has no rales.  Abdominal: Soft. He exhibits no distension. There is no tenderness. There is no guarding.  Musculoskeletal: He exhibits no edema.  Neurological: He is alert and oriented to person, place, and time.  Skin: Skin is warm and dry. He is not diaphoretic.  Nursing note and vitals reviewed.    ED Treatments / Results  Labs (all labs ordered are listed, but only abnormal results are displayed) Labs Reviewed  COMPREHENSIVE METABOLIC PANEL - Abnormal; Notable for the following:       Result Value   Potassium 6.2 (*)    Chloride 94 (*)    CO2 21 (*)    BUN 71 (*)    Creatinine, Ser 14.04 (*)    Calcium 7.5 (*)    Albumin 3.3 (*)    AST 14 (*)    ALT 8 (*)    Alkaline Phosphatase 133 (*)    GFR calc non Af Amer 3 (*)    GFR calc Af Amer 4 (*)    Anion gap 20 (*)    All other components within normal limits  CBC - Abnormal; Notable for the following:    RBC 3.38 (*)    Hemoglobin 9.7 (*)    HCT 29.0 (*)    RDW 15.8 (*)    All other components within normal limits  TROPONIN I - Abnormal; Notable for the following:    Troponin I 0.04 (*)    All other components within normal limits  BASIC METABOLIC PANEL - Abnormal; Notable for the following:    Chloride 100 (*)    CO2 21 (*)    Glucose, Bld 46 (*)    BUN 78 (*)    Creatinine, Ser 14.82 (*)    Calcium 8.0 (*)    GFR calc non Af Amer 3 (*)  GFR calc Af Amer 4 (*)    Anion gap 19 (*)    All other components within normal  limits  PROTIME-INR - Abnormal; Notable for the following:    Prothrombin Time 15.5 (*)    All other components within normal limits  LIPASE, BLOOD  HEMOGLOBIN A1C  PROTIME-INR  URINALYSIS, ROUTINE W REFLEX MICROSCOPIC  TROPONIN I  TROPONIN I  COMPREHENSIVE METABOLIC PANEL  CBC  PROTIME-INR    EKG  EKG Interpretation  Date/Time:  Sunday April 20 2017 21:20:23 EDT Ventricular Rate:  69 PR Interval:    QRS Duration: 116 QT Interval:  482 QTC Calculation: 517 R Axis:   20 Text Interpretation:  Sinus rhythm Probable left atrial enlargement LVH with secondary repolarization abnormality Prolonged QT interval No significant change since last tracing Confirmed by Gareth Morgan [phone]) on 04/20/2017 9:24:49 PM       Radiology Ct Abdomen Pelvis Wo Contrast  Result Date: 04/21/2017 CLINICAL DATA:  Abdominal pain and vomiting. History of diabetes, renal insufficiency, anemia, dialysis. EXAM: CT ABDOMEN AND PELVIS WITHOUT CONTRAST TECHNIQUE: Multidetector CT imaging of the abdomen and pelvis was performed following the standard protocol without IV contrast. COMPARISON:  01/05/2017 FINDINGS: Lower chest: Atelectasis in the lung bases. Diffuse cardiac enlargement. Mild retrocrural lymphadenopathy. Hepatobiliary: Gallbladder is somewhat contracted and the wall appears mildly thickened. Can't exclude inflammatory process. No radiopaque stones are identified. No bile duct dilatation. No focal liver lesions. Pancreas: Unremarkable. No pancreatic ductal dilatation or surrounding inflammatory changes. Spleen: Normal in size without focal abnormality. Adrenals/Urinary Tract: No adrenal gland nodules. The kidneys are atrophic bilaterally. No hydronephrosis. Small cysts are demonstrated bilaterally. There is a 4.4 cm mass arising from the midportion of the right kidney. This lesion was present previously and is unchanged in size. The lesion has a density measurement of 40 Hounsfield units. This  suggests either a hemorrhagic cyst or a solid lesion. Renal cell carcinoma is not excluded. The lesion demonstrated significant increase in size between studies dated 03/11/2015 and 10/30/2015 but has remained stable in size since then. Bladder is decompressed. There is a left pelvic transplant kidney which appears to be decreased in attenuation, possibly hydronephrotic. There is extensive calcification in the hilar vessels. Stomach/Bowel: Stomach, small bowel, and colon are not abnormally distended. No wall thickening is demonstrated although lack of contrast material and under distention limit evaluation. The appendix is normal. Vascular/Lymphatic: Extensive vascular calcifications involving aorto iliac vessels. Retroperitoneal lymphadenopathy is demonstrated with periaortic nodes measuring up to 14 mm diameter. These are nonspecific in could be metastatic, reactive, or lymphoproliferative thin origin. Similar appearance to previous studies. Reproductive: Prostate is unremarkable. Other: No free air or free fluid in the abdomen. Motion artifact limits the examination. Musculoskeletal: Diffusely increased mineralization throughout the visualized spine consistent with renal osteodystrophy. No destructive bone lesions. IMPRESSION: 1. No definite evidence of bowel obstruction or inflammation although motion artifact and lack of distention and contrast limit examination. 2. Bilateral renal atrophy and renal cysts. Indeterminate 4.4 cm mass from the midportion of the right kidney could represent a solid lesion. No change in size dating back to 10/30/2015. 3. Left pelvic transplant kidney may be hydronephrotic and demonstrate significant vascular calcification. 4. Extensive aortic atherosclerosis. 5. Bone changes consistent with renal osteodystrophy. 6. Diffuse cardiac enlargement. 7. Mild retrocrural and retroperitoneal lymphadenopathy is indeterminate and could represent reactive change, metastatic disease, or  lymphoproliferative disorder. No change since previous studies. Electronically Signed   By: Lucienne Capers M.D.   On: 04/21/2017  00:35   Dg Chest 1 View  Result Date: 04/21/2017 CLINICAL DATA:  Nausea, vomiting, and mid abdominal pain today. EXAM: CHEST 1 VIEW COMPARISON:  None 918 FINDINGS: Cardiac enlargement with mild central pulmonary vascular congestion. No edema or consolidation. No blunting of costophrenic angles. No pneumothorax. Calcification of the aorta. IMPRESSION: Cardiac enlargement with mild central pulmonary vascular congestion. No edema or consolidation. Electronically Signed   By: Lucienne Capers M.D.   On: 04/21/2017 01:01    Procedures Procedures (including critical care time)  Medications Ordered in ED Medications  ondansetron (ZOFRAN) injection 4 mg (not administered)  cloNIDine (CATAPRES - Dosed in mg/24 hr) patch 0.3 mg (not administered)  hydrALAZINE (APRESOLINE) tablet 100 mg (not administered)  isosorbide mononitrate (IMDUR) 24 hr tablet 60 mg (not administered)  sevelamer carbonate (RENVELA) tablet 800 mg (not administered)  amLODipine (NORVASC) tablet 10 mg (10 mg Oral Given 04/21/17 0236)  metoCLOPramide (REGLAN) tablet 5 mg (not administered)  pantoprazole (PROTONIX) EC tablet 40 mg (not administered)  traMADol (ULTRAM) tablet 50 mg (not administered)  insulin aspart (novoLOG) injection 0-9 Units (not administered)  acetaminophen (TYLENOL) tablet 650 mg (not administered)    Or  acetaminophen (TYLENOL) suppository 650 mg (not administered)  iopamidol (ISOVUE-300) 61 % injection (not administered)  Warfarin - Pharmacist Dosing Inpatient (not administered)  insulin aspart (novoLOG) injection 5 Units (5 Units Intravenous Given 04/20/17 2149)  dextrose 50 % solution 50 mL (50 mLs Intravenous Given 04/20/17 2150)  haloperidol lactate (HALDOL) injection 3 mg (3 mg Intravenous Given 04/20/17 2149)  sodium polystyrene (KAYEXALATE) 15 GM/60ML suspension 30 g (30 g  Rectal Given 04/20/17 2300)  calcium gluconate 1 g in sodium chloride 0.9 % 100 mL IVPB (0 g Intravenous Stopped 04/21/17 0020)  sodium bicarbonate injection 50 mEq (50 mEq Intravenous Given 04/20/17 2321)  LORazepam (ATIVAN) injection 1 mg (1 mg Intravenous Given 04/20/17 2336)  warfarin (COUMADIN) tablet 10 mg (10 mg Oral Given 04/21/17 0236)     Initial Impression / Assessment and Plan / ED Course  I have reviewed the triage vital signs and the nursing notes.  Pertinent labs & imaging results that were available during my care of the patient were reviewed by me and considered in my medical decision making (see chart for details).     51 year old male with a history of CHF, DVT, DM hypertension, hyperlipidemia, ESRD on dialysis Tuesday Thursday Saturday, with most recent session yesterday, who presents with concern for abdominal pain, nausea, vomiting and concern that his potassium may be high.  Nonfocal abdominal exam, doubt cholecystitis, appendicitis, diverticulitis.  Doubt perforation given gradual onset of symptoms, soft abdomen. Has been passing flatus, had loose stool, doubt acute obstruction.  Suspect epigastric pain, nausea and vomiting secondary to gastroparesis or gastritis. Has tenderness on exam and doubt ACS. Given haldol for emesis and pain.  Labs significant for hyperkalemia with potassium of 6.2.  EKG without significant changes.  Given insulin and dextrose. Discussed with nephrology, Dr. Justin Mend who plans on dialysis in AM unless patient has any significant changes overnight.  Admitted to hospitalist, Dr. Maudie Mercury.  Final Clinical Impressions(s) / ED Diagnoses   Final diagnoses:  Hyperkalemia  Epigastric abdominal pain    New Prescriptions New Prescriptions   No medications on file     Gareth Morgan, MD 04/21/17 [phone]

## 2017-04-21 ENCOUNTER — Inpatient Hospital Stay (HOSPITAL_COMMUNITY): Payer: Medicare Other

## 2017-04-21 DIAGNOSIS — R1013 Epigastric pain: Secondary | ICD-10-CM

## 2017-04-21 DIAGNOSIS — F1210 Cannabis abuse, uncomplicated: Secondary | ICD-10-CM

## 2017-04-21 DIAGNOSIS — N186 End stage renal disease: Secondary | ICD-10-CM

## 2017-04-21 DIAGNOSIS — E1165 Type 2 diabetes mellitus with hyperglycemia: Secondary | ICD-10-CM

## 2017-04-21 DIAGNOSIS — Z992 Dependence on renal dialysis: Secondary | ICD-10-CM

## 2017-04-21 DIAGNOSIS — E1129 Type 2 diabetes mellitus with other diabetic kidney complication: Secondary | ICD-10-CM

## 2017-04-21 DIAGNOSIS — E875 Hyperkalemia: Principal | ICD-10-CM

## 2017-04-21 LAB — BASIC METABOLIC PANEL
Anion gap: 19 — ABNORMAL HIGH (ref 5–15)
BUN: 78 mg/dL — AB (ref 6–20)
CHLORIDE: 100 mmol/L — AB (ref 101–111)
CO2: 21 mmol/L — AB (ref 22–32)
CREATININE: 14.82 mg/dL — AB (ref 0.61–1.24)
Calcium: 8 mg/dL — ABNORMAL LOW (ref 8.9–10.3)
GFR calc Af Amer: 4 mL/min — ABNORMAL LOW (ref >60)
GFR calc non Af Amer: 3 mL/min — ABNORMAL LOW (ref >60)
GLUCOSE: 46 mg/dL — AB (ref 65–99)
Potassium: 4.4 mmol/L (ref 3.5–5.1)
Sodium: 140 mmol/L (ref 135–145)

## 2017-04-21 LAB — GLUCOSE, CAPILLARY: Glucose-Capillary: 102 mg/dL — ABNORMAL HIGH (ref 65–99)

## 2017-04-21 LAB — CBC
HEMATOCRIT: 26.7 % — AB (ref 39.0–52.0)
HEMOGLOBIN: 8.7 g/dL — AB (ref 13.0–17.0)
MCH: 27.6 pg (ref 26.0–34.0)
MCHC: 32.6 g/dL (ref 30.0–36.0)
MCV: 84.8 fL (ref 78.0–100.0)
Platelets: 147 10*3/uL — ABNORMAL LOW (ref 150–400)
RBC: 3.15 MIL/uL — ABNORMAL LOW (ref 4.22–5.81)
RDW: 15.7 % — AB (ref 11.5–15.5)
WBC: 5.2 10*3/uL (ref 4.0–10.5)

## 2017-04-21 LAB — CBG MONITORING, ED: GLUCOSE-CAPILLARY: 77 mg/dL (ref 65–99)

## 2017-04-21 LAB — HEMOGLOBIN A1C
Hgb A1c MFr Bld: 5.2 % (ref 4.8–5.6)
Mean Plasma Glucose: 102.54 mg/dL

## 2017-04-21 LAB — PROTIME-INR
INR: 1.19
INR: 1.24
PROTHROMBIN TIME: 15 s (ref 11.4–15.2)
Prothrombin Time: 15.5 seconds — ABNORMAL HIGH (ref 11.4–15.2)

## 2017-04-21 LAB — TROPONIN I: Troponin I: 0.04 ng/mL (ref <0.03)

## 2017-04-21 MED ORDER — METOCLOPRAMIDE HCL 5 MG PO TABS: 5.0000 mg | ORAL_TABLET | Freq: Three times a day (TID) | ORAL | Status: DC | PRN

## 2017-04-21 MED ORDER — SEVELAMER CARBONATE 800 MG PO TABS
2400.0000 mg | ORAL_TABLET | Freq: Three times a day (TID) | ORAL | Status: DC
  Administered 2017-04-21 – 2017-04-22 (×3): 2400 mg via ORAL
  Filled 2017-04-21 (×3): qty 3

## 2017-04-21 MED ORDER — ACETAMINOPHEN 325 MG PO TABS: 650.0000 mg | ORAL_TABLET | Freq: Four times a day (QID) | ORAL | Status: DC | PRN

## 2017-04-21 MED ORDER — TRAMADOL HCL 50 MG PO TABS
ORAL_TABLET | ORAL | Status: AC
  Filled 2017-04-21: qty 1

## 2017-04-21 MED ORDER — WARFARIN SODIUM 10 MG PO TABS
10.0000 mg | ORAL_TABLET | Freq: Once | ORAL | Status: AC
  Administered 2017-04-21: 10 mg via ORAL
  Filled 2017-04-21 (×2): qty 1

## 2017-04-21 MED ORDER — INSULIN ASPART 100 UNIT/ML ~~LOC~~ SOLN: 0.0000 [IU] | Freq: Three times a day (TID) | SUBCUTANEOUS | Status: DC

## 2017-04-21 MED ORDER — AMLODIPINE BESYLATE 10 MG PO TABS
10.0000 mg | ORAL_TABLET | Freq: Every day | ORAL | Status: DC
  Administered 2017-04-21 – 2017-04-22 (×2): 10 mg via ORAL
  Filled 2017-04-21: qty 2
  Filled 2017-04-21: qty 1

## 2017-04-21 MED ORDER — ACETAMINOPHEN 650 MG RE SUPP: 650.0000 mg | Freq: Four times a day (QID) | RECTAL | Status: DC | PRN

## 2017-04-21 MED ORDER — ISOSORBIDE MONONITRATE ER 60 MG PO TB24
60.0000 mg | ORAL_TABLET | Freq: Every day | ORAL | Status: DC
  Administered 2017-04-21 – 2017-04-22 (×2): 60 mg via ORAL
  Filled 2017-04-21: qty 1
  Filled 2017-04-21: qty 2
  Filled 2017-04-21: qty 1

## 2017-04-21 MED ORDER — WARFARIN - PHARMACIST DOSING INPATIENT: Freq: Every day | Status: DC

## 2017-04-21 MED ORDER — HYDRALAZINE HCL 50 MG PO TABS
100.0000 mg | ORAL_TABLET | Freq: Two times a day (BID) | ORAL | Status: DC
  Administered 2017-04-21 – 2017-04-22 (×3): 100 mg via ORAL
  Filled 2017-04-21 (×4): qty 2

## 2017-04-21 MED ORDER — WARFARIN SODIUM 10 MG PO TABS
10.0000 mg | ORAL_TABLET | Freq: Once | ORAL | Status: AC
  Administered 2017-04-21: 10 mg via ORAL
  Filled 2017-04-21: qty 1

## 2017-04-21 MED ORDER — CALCITRIOL 0.5 MCG PO CAPS
3.7500 ug | ORAL_CAPSULE | ORAL | Status: DC
  Administered 2017-04-21: 3.75 ug via ORAL
  Filled 2017-04-21: qty 1

## 2017-04-21 MED ORDER — DARBEPOETIN ALFA 150 MCG/0.3ML IJ SOSY
PREFILLED_SYRINGE | INTRAMUSCULAR | Status: AC
  Filled 2017-04-21: qty 0.3

## 2017-04-21 MED ORDER — PANTOPRAZOLE SODIUM 40 MG PO TBEC
40.0000 mg | DELAYED_RELEASE_TABLET | Freq: Two times a day (BID) | ORAL | Status: DC
  Administered 2017-04-21 – 2017-04-22 (×2): 40 mg via ORAL
  Filled 2017-04-21 (×2): qty 1

## 2017-04-21 MED ORDER — CLONIDINE HCL 0.3 MG/24HR TD PTWK: 0.3000 mg | MEDICATED_PATCH | TRANSDERMAL | Status: DC

## 2017-04-21 MED ORDER — TRAMADOL HCL 50 MG PO TABS
50.0000 mg | ORAL_TABLET | Freq: Two times a day (BID) | ORAL | Status: DC | PRN
  Administered 2017-04-21: 50 mg via ORAL

## 2017-04-21 MED ORDER — CINACALCET HCL 30 MG PO TABS
90.0000 mg | ORAL_TABLET | Freq: Every day | ORAL | Status: DC
  Administered 2017-04-21: 90 mg via ORAL
  Filled 2017-04-21: qty 3

## 2017-04-21 MED ORDER — DARBEPOETIN ALFA 150 MCG/0.3ML IJ SOSY
150.0000 ug | PREFILLED_SYRINGE | INTRAMUSCULAR | Status: DC
  Administered 2017-04-21: 150 ug via INTRAVENOUS
  Filled 2017-04-21: qty 0.3

## 2017-04-21 MED ORDER — SEVELAMER CARBONATE 800 MG PO TABS
800.0000 mg | ORAL_TABLET | Freq: Three times a day (TID) | ORAL | Status: DC
  Filled 2017-04-21: qty 1

## 2017-04-21 NOTE — ED Notes (Signed)
Pt CBG was 75, notified Bonnie(RN)

## 2017-04-21 NOTE — Consult Note (Signed)
[facility] Renal Consultation Note    Indication for Consultation:  Management of ESRD/hemodialysis, anemia, hypertension/volume, and secondary hyperparathyroidism. PCP:  HPI: Frank [name] is a 51 y.o. male with ESRD, HTN, Hx DVT (on warfarin), NICM, Type 2 DM, and Hx non-compliance was admitted with hyperkalemia in the setting of missed HD.   Reports that his truck broke down on Saturday on his way to dialysis, causing him to miss his treatment (last HD was Thursday 9/27). Later that day, he developed CP, dyspnea, N/V, abdominal pain, and weakness. Presented to the ED last night with worsening symptoms. Labs showed K 6.2 without EKG changes, he was treated with CaGluc/bicarb/Kayexalate. CXR + pulm congestion. Abdominal CT without acute findings. No new symptoms, still with abdominal pain but vomiting has eased up. No headache, blurred vision. Repeat K this morning is 4.4  Last HD 9/27, left several kg over EDW. Using AVF without issues.   Past Medical History:  Diagnosis Date  . Anemia   . Anxiety   . Chronic combined systolic and diastolic CHF (congestive heart failure) (HCC)    a. EF 20-25% by echo in 08/2015 b. echo 10/2015: EF 35-40%, diffuse HK, severe LAE, moderate RAE, small pericardial effusion  . Complication of anesthesia    itching, sore throat  . Depression   . Dialysis patient ([place])   . DVT (deep venous thrombosis) ([place]) 02/2017  . ESRD (end stage renal disease) ([place])    due to HTN per patient, followed at [facility], s/p failed kidney transplant - dialysis Tue, Th, Sat  . Hyperkalemia 12/2015  . Hypertension   . Junctional rhythm    a. noted in 08/2015: hyperkalemic at that time  b. 12/2015: presented in junctional rhythm w/ K+ of 6.6. Resolved with improvement of K+ levels.  . Nonischemic cardiomyopathy ([place])    a. 08/2014: cath showing minimal CAD, but tortuous arteries noted.   . Personal history of DVT (deep vein thrombosis)/ PE 05/26/2016   In Oct 2015  had small subsemental LUL PE w/o DVT (LE dopplers neg) and was felt to be HD cath related, treated w coumadin.  IN May 2016 had small vein DVT (acute/subacute) in the R basilic/ brachial veins of the RUE, resumed on coumadin.  Had R sided HD cath at that time.    . Renal insufficiency   . Shortness of breath   . Type II diabetes mellitus (HCC)    No history per patient, but remains under history as A1c would not be accurate given on dialysis   Past Surgical History:  Procedure Laterality Date  . CAPD INSERTION    . CAPD REMOVAL    . INGUINAL HERNIA REPAIR Right 02/14/2015   Procedure: REPAIR INCARCERATED RIGHT INGUINAL HERNIA;  Surgeon: [name], MD;  Location: [place];  Service: General;  Laterality: Right;  . INSERTION OF DIALYSIS CATHETER Right 09/23/2015   Procedure: exchange of Right internal Dialysis Catheter.;  Surgeon: [name], MD;  Location: [place];  Service: Vascular;  Laterality: Right;  . IR GENERIC HISTORICAL  07/16/2016   IR US GUIDE VASC ACCESS LEFT 07/16/2016 Corrie Mckusick, DO [facility]  . IR GENERIC HISTORICAL Left 07/16/2016   IR THROMBECTOMY AV FISTULA W/THROMBOLYSIS/PTA INC/SHUNT/IMG LEFT 07/16/2016 Corrie Mckusick, DO [facility]  . KIDNEY RECEIPIENT  2006   failed and started HD in March 2014  . LEFT HEART CATHETERIZATION WITH CORONARY ANGIOGRAM N/A 09/02/2014   Procedure: LEFT HEART CATHETERIZATION WITH CORONARY ANGIOGRAM;  Surgeon: Leonie Green  Ellyn Hack, MD;  Location: [facility] CATH LAB;  Service: Cardiovascular;  Laterality: N/A;   Family History  Problem Relation Age of Onset  . Hypertension Other    Social History:  reports that he has quit smoking. His smoking use included Cigarettes. He smoked 0.00 packs per day for 1.00 year. He has never used smokeless tobacco. He reports that he uses drugs, including Marijuana. He reports that he does not drink alcohol.  ROS: As per HPI otherwise negative.  Physical Exam: Vitals:   04/20/17 2330 04/21/17 0000 04/21/17 0030  04/21/17 0630  BP: (!) 204/117 (!) 194/110 (!) 194/110 (!) 173/137  Pulse: 81  83 (!) 52  Resp: 16 (!) 24 15   Temp:      TempSrc:      SpO2: 96%  95% (!) 42%  Weight:      Height:         General: Well developed, well nourished, in no acute distress. Head: Normocephalic, atraumatic, sclera non-icteric, mucus membranes are moist. Neck: Supple without lymphadenopathy/masses. JVD not elevated. Lungs: Coarse air movement throughout without rales or wheezing. Heart: RRR with normal S1, S2. No murmurs, rubs, or gallops appreciated. Abdomen: Soft, non-tender, non-distended with normoactive bowel sounds. \Musculoskeletal:  Strength and tone appear normal for age. Lower extremities: No edema or ischemic changes, no open wounds. Neuro: Alert and oriented X 3. Moves all extremities spontaneously. Psych:  Responds to questions appropriately with a normal affect. Dialysis Access: L AVF + thrill/bruit  Allergies  Allergen Reactions  . Butalbital-Apap-Caffeine Shortness Of Breath, Swelling and Other (See Comments)    Swelling in throat  . Ferrlecit [Na Ferric Gluc Cplx In Sucrose] Shortness Of Breath, Swelling and Other (See Comments)    Swelling in throat, tolerates Venofor  . Minoxidil Shortness Of Breath  . Tylenol [Acetaminophen] Anaphylaxis and Swelling  . Darvocet [Propoxyphene N-Acetaminophen] Hives   Prior to Admission medications   Medication Sig Start Date End Date Taking? Authorizing Provider  amLODipine (NORVASC) 10 MG tablet Take 1 tablet (10 mg total) by mouth at bedtime. 12/01/16  Yes Rice, Resa Miner, MD  cloNIDine (CATAPRES - DOSED IN MG/24 HR) 0.3 mg/24hr patch Place 0.3 mg onto the skin once a week. On Wednesday  12/31/16  Yes [provider]  hydrALAZINE (APRESOLINE) 100 MG tablet Take 1 tablet (100 mg total) by mouth 2 (two) times daily. 01/07/17  Yes Ledell Noss, MD  isosorbide mononitrate (IMDUR) 60 MG 24 hr tablet Take 1 tablet (60 mg total) by mouth daily.  01/08/17  Yes Ledell Noss, MD  metoCLOPramide (REGLAN) 5 MG tablet Take 1 tablet (5 mg total) by mouth every 8 (eight) hours as needed for nausea. 12/01/16  Yes Rice, Resa Miner, MD  ondansetron (ZOFRAN ODT) 8 MG disintegrating tablet 55m ODT q4 hours prn nausea 03/30/17  Yes Mesner, JCorene Cornea MD  pantoprazole (PROTONIX) 40 MG tablet Take 1 tablet (40 mg total) by mouth 2 (two) times daily before a meal. 12/01/16  Yes Rice, CResa Miner MD  sevelamer carbonate (RENVELA) 800 MG tablet Take 1 tablet (800 mg total) by mouth 3 (three) times daily with meals. Patient taking differently: Take 800 mg by mouth 3 (three) times daily with meals. 800 mg as needed with snack 01/06/17  Yes BLedell Noss MD  traMADol (ULTRAM) 50 MG tablet Take 1 tablet (50 mg total) by mouth every 12 (twelve) hours as needed for moderate pain or severe pain. 12/01/16  Yes Rice, CResa Miner MD  warfarin (COUMADIN) 5  MG tablet Take 7.5 mg of Coumadin daily for the next 2 days. After that take 5 mg daily, unless the physicians at the dialysis center one to keep by mouth and a 7.5 mg. Patient taking differently: Take 7.5 mg by mouth daily.  03/30/17  Yes Mesner, Corene Cornea, MD   Current Facility-Administered Medications  Medication Dose Route Frequency Provider Last Rate Last Dose  . acetaminophen (TYLENOL) tablet 650 mg  650 mg Oral Q6H PRN Jani Gravel, MD       Or  . acetaminophen (TYLENOL) suppository 650 mg  650 mg Rectal Q6H PRN Jani Gravel, MD      . amLODipine (NORVASC) tablet 10 mg  10 mg Oral Loma Sousa, MD   10 mg at 04/21/17 0236  . [START ON 04/23/2017] cloNIDine (CATAPRES - Dosed in mg/24 hr) patch 0.3 mg  0.3 mg Transdermal Q Newt Minion, MD      . hydrALAZINE (APRESOLINE) tablet 100 mg  100 mg Oral BID Jani Gravel, MD   100 mg at 04/21/17 4562  . insulin aspart (novoLOG) injection 0-9 Units  0-9 Units Subcutaneous TID WC Jani Gravel, MD      . iopamidol (ISOVUE-300) 61 % injection           . isosorbide mononitrate (IMDUR) 24  hr tablet 60 mg  60 mg Oral Daily Jani Gravel, MD      . metoCLOPramide (REGLAN) tablet 5 mg  5 mg Oral Q8H PRN Jani Gravel, MD      . ondansetron [facility]) injection 4 mg  4 mg Intravenous Q6H PRN Jani Gravel, MD      . pantoprazole (PROTONIX) EC tablet 40 mg  40 mg Oral BID AC Jani Gravel, MD      . sevelamer carbonate (RENVELA) tablet 800 mg  800 mg Oral TID WC Jani Gravel, MD      . traMADol Veatrice Bourbon) tablet 50 mg  50 mg Oral Q12H PRN Jani Gravel, MD      . Warfarin - Pharmacist Dosing Inpatient   Does not apply q1800 Erenest Blank [facility]       Current Outpatient Prescriptions  Medication Sig Dispense Refill  . amLODipine (NORVASC) 10 MG tablet Take 1 tablet (10 mg total) by mouth at bedtime. 30 tablet 2  . cloNIDine (CATAPRES - DOSED IN MG/24 HR) 0.3 mg/24hr patch Place 0.3 mg onto the skin once a week. On Wednesday     . hydrALAZINE (APRESOLINE) 100 MG tablet Take 1 tablet (100 mg total) by mouth 2 (two) times daily. 60 tablet 0  . isosorbide mononitrate (IMDUR) 60 MG 24 hr tablet Take 1 tablet (60 mg total) by mouth daily. 30 tablet 0  . metoCLOPramide (REGLAN) 5 MG tablet Take 1 tablet (5 mg total) by mouth every 8 (eight) hours as needed for nausea. 60 tablet 1  . ondansetron (ZOFRAN ODT) 8 MG disintegrating tablet 32m ODT q4 hours prn nausea 10 tablet 0  . pantoprazole (PROTONIX) 40 MG tablet Take 1 tablet (40 mg total) by mouth 2 (two) times daily before a meal. 60 tablet 2  . sevelamer carbonate (RENVELA) 800 MG tablet Take 1 tablet (800 mg total) by mouth 3 (three) times daily with meals. (Patient taking differently: Take 800 mg by mouth 3 (three) times daily with meals. 800 mg as needed with snack) 90 tablet 0  . traMADol (ULTRAM) 50 MG tablet Take 1 tablet (50 mg total) by mouth every 12 (twelve) hours as needed for  moderate pain or severe pain. 10 tablet 0  . warfarin (COUMADIN) 5 MG tablet Take 7.5 mg of Coumadin daily for the next 2 days. After that take 5 mg daily, unless the  physicians at the dialysis center one to keep by mouth and a 7.5 mg. (Patient taking differently: Take 7.5 mg by mouth daily. ) 30 tablet 0   Labs: Basic Metabolic Panel:  Recent Labs Lab 04/20/17 1921 04/21/17 0042  NA 135 140  K 6.2* 4.4  CL 94* 100*  CO2 21* 21*  GLUCOSE 83 46*  BUN 71* 78*  CREATININE 14.04* 14.82*  CALCIUM 7.5* 8.0*   Liver Function Tests:  Recent Labs Lab 04/20/17 1921  AST 14*  ALT 8*  ALKPHOS 133*  BILITOT 0.8  PROT 7.9  ALBUMIN 3.3*    Recent Labs Lab 04/20/17 1921  LIPASE 48   CBC:  Recent Labs Lab 04/20/17 1921  WBC 4.4  HGB 9.7*  HCT 29.0*  MCV 85.8  PLT 164   Cardiac Enzymes:  Recent Labs Lab 04/21/17 0006  TROPONINI 0.04*   CBG:  Recent Labs Lab 04/21/17 0822  GLUCAP 77   Studies/Results: Ct Abdomen Pelvis Wo Contrast  Result Date: 04/21/2017 CLINICAL DATA:  Abdominal pain and vomiting. History of diabetes, renal insufficiency, anemia, dialysis. EXAM: CT ABDOMEN AND PELVIS WITHOUT CONTRAST TECHNIQUE: Multidetector CT imaging of the abdomen and pelvis was performed following the standard protocol without IV contrast. COMPARISON:  01/05/2017 FINDINGS: Lower chest: Atelectasis in the lung bases. Diffuse cardiac enlargement. Mild retrocrural lymphadenopathy. Hepatobiliary: Gallbladder is somewhat contracted and the wall appears mildly thickened. Can't exclude inflammatory process. No radiopaque stones are identified. No bile duct dilatation. No focal liver lesions. Pancreas: Unremarkable. No pancreatic ductal dilatation or surrounding inflammatory changes. Spleen: Normal in size without focal abnormality. Adrenals/Urinary Tract: No adrenal gland nodules. The kidneys are atrophic bilaterally. No hydronephrosis. Small cysts are demonstrated bilaterally. There is a 4.4 cm mass arising from the midportion of the right kidney. This lesion was present previously and is unchanged in size. The lesion has a density measurement of 40  Hounsfield units. This suggests either a hemorrhagic cyst or a solid lesion. Renal cell carcinoma is not excluded. The lesion demonstrated significant increase in size between studies dated 03/11/2015 and 10/30/2015 but has remained stable in size since then. Bladder is decompressed. There is a left pelvic transplant kidney which appears to be decreased in attenuation, possibly hydronephrotic. There is extensive calcification in the hilar vessels. Stomach/Bowel: Stomach, small bowel, and colon are not abnormally distended. No wall thickening is demonstrated although lack of contrast material and under distention limit evaluation. The appendix is normal. Vascular/Lymphatic: Extensive vascular calcifications involving aorto iliac vessels. Retroperitoneal lymphadenopathy is demonstrated with periaortic nodes measuring up to 14 mm diameter. These are nonspecific in could be metastatic, reactive, or lymphoproliferative thin origin. Similar appearance to previous studies. Reproductive: Prostate is unremarkable. Other: No free air or free fluid in the abdomen. Motion artifact limits the examination. Musculoskeletal: Diffusely increased mineralization throughout the visualized spine consistent with renal osteodystrophy. No destructive bone lesions. IMPRESSION: 1. No definite evidence of bowel obstruction or inflammation although motion artifact and lack of distention and contrast limit examination. 2. Bilateral renal atrophy and renal cysts. Indeterminate 4.4 cm mass from the midportion of the right kidney could represent a solid lesion. No change in size dating back to 10/30/2015. 3. Left pelvic transplant kidney may be hydronephrotic and demonstrate significant vascular calcification. 4. Extensive aortic atherosclerosis. 5. Bone  changes consistent with renal osteodystrophy. 6. Diffuse cardiac enlargement. 7. Mild retrocrural and retroperitoneal lymphadenopathy is indeterminate and could represent reactive change,  metastatic disease, or lymphoproliferative disorder. No change since previous studies. Electronically Signed   By: Lucienne Capers M.D.   On: 04/21/2017 00:35   Dg Chest 1 View  Result Date: 04/21/2017 CLINICAL DATA:  Nausea, vomiting, and mid abdominal pain today. EXAM: CHEST 1 VIEW COMPARISON:  None 918 FINDINGS: Cardiac enlargement with mild central pulmonary vascular congestion. No edema or consolidation. No blunting of costophrenic angles. No pneumothorax. Calcification of the aorta. IMPRESSION: Cardiac enlargement with mild central pulmonary vascular congestion. No edema or consolidation. Electronically Signed   By: Lucienne Capers M.D.   On: 04/21/2017 01:01    Dialysis Orders:  TTS at [facility] 4hr, BFR 400, DFR 800, EDW 71kg, 2K/2.25Ca, AVF, Heparin 2000 bolus - Mircera 247mg IV q 2 weeks (last given 9/6) - Calcitriol 3.748m PO q HD  Assessment/Plan: 1.  Hyperkalemia: In setting of missed HD. Treated medically, for HD today to further correct. 2.  ESRD: Missed last HD, for HD today, then back to usual TTS schedule. 3.  Hypertension/volume: BP high, above EDW. Trying 3.5 - 4L UF as tolerated. 4.  Anemia: Hgb 9.7. Will give Aranesp 15088mtoday. 5.  Metabolic bone disease: Ca ok. Continue Calcitriol, PO Sensipar 47m78mnd Renvela 3/meals. 6.  Type 2 DM: On insulin, per primary. 7.  Hx RUE DVT (Dx 03/11/17, s/p admit for anticoagulation, left AMA): On warfarin, low INR. Per primary.  KatiVeneta Penton-C 04/21/2017, 9:57 AM  [facility] Pager: [phone] seen, examined and agree w A/P as above. ESRD pt missed last dialysis ,here with ^K+, SOB and mild edema on CXR.  Plan HD today.   Rob Kelly [facility] [phone]/07/2016, 4:25 PM

## 2017-04-21 NOTE — Progress Notes (Signed)
PROGRESS NOTE    Frank [name]  UEA:[id number] DOB: [birth year] DOA: 04/20/2017 PCP: Lolita Patella, MD   Outpatient Specialists:     Brief Narrative:  Frank [name]  is a 51 y.o. male,  Dm2, hypertension, DV/PE, ESRD on HD (T, T, S), apparently presented to ED for evaluation of n/v, and abdominal pain.  x2 days,  "sharp" epgiastric, intermittent.  No hematemesis.  Pt denies fever, chills, diarrhea, brbpr, black stool.  Pt doesn't think anything made his abd pain better or worse.    Assessment & Plan:   Principal Problem:   Hyperkalemia Active Problems:   Nausea & vomiting   DM (diabetes mellitus), type 2, uncontrolled, with renal complications (HCC)   ESRD on hemodialysis (HCC)   Abdominal pain   Marijuana abuse   N/v - suspect from marijuana abuse Con protonix zofran 40m iv q6h prn  Abdominal pain CT scan abd/pelvis shows some stable lymphadenopathy  Hyperkalemia Calcium gluconate Sodium bicarbonate Kayexalate Plan for HD today  ESRD on HD T,T, S -plan for HD today  Anemia Defer to renal  DVT/PE Coumadin pharmacy to dose -new DVT in UE but do not think patient will tolerate heparin Gtt-- has never had a therapeutic INR  Hypertension Cont current bp medication  Dm2 SSI  Protein calorie malnutrition,  Severe prostat  Retroperitoneal lymphadenopathy -defer to outpatient treatment   DVT prophylaxis:  coumadin  Code Status: Full Code   Family Communication:   Disposition Plan:     Consultants:  renal  Subjective: Pain improved, wants to eat but refusing any liquids   Objective: Vitals:   04/21/17 0000 04/21/17 0030 04/21/17 0630 04/21/17 1220  BP: (!) 194/110 (!) 194/110 (!) 173/137 (!) 177/115  Pulse:  83 (!) 52 89  Resp: (!) _0 Temp:    98.2 F (36.8 C)  TempSrc:    Oral  SpO2:  95% (!) 42% 100%  Weight:      Height:    _1  (1.88 m)    Intake/Output Summary (Last 24 hours) at 04/21/17 1309 Last data filed  at 04/21/17 1223  Gross per 24 hour  Intake              450 ml  Output                0 ml  Net              450 ml   Filed Weights   04/20/17 1913  Weight: 73.9 kg (163 lb)    Examination:  General exam: Appears calm and comfortable  Respiratory system: Clear to auscultation. Respiratory effort normal. Cardiovascular system: S1 & S2 heard, RRR. No JVD, murmurs, rubs, gallops or clicks. No pedal edema. Gastrointestinal system: Abdomen is nondistended, soft and nontender. No organomegaly or masses felt. Normal bowel sounds heard. Central nervous system: Alert and oriented. No focal neurological deficits. Extremities: Symmetric 5 x 5 power. Skin: No rashes, lesions or ulcers Psychiatry: Judgement and insight appear normal. Mood & affect appropriate.     Data Reviewed: I have personally reviewed following labs and imaging studies  CBC:  Recent Labs Lab 04/20/17 1921  WBC 4.4  HGB 9.7*  HCT 29.0*  MCV 85.8  PLT 1478  Basic Metabolic Panel:  Recent Labs Lab 04/20/17 1921 04/21/17 0042  NA 135 140  K 6.2* 4.4  CL 94* 100*  CO2 21* 21*  GLUCOSE 83 46*  BUN 71* 78*  CREATININE  14.04* 14.82*  CALCIUM 7.5* 8.0*   GFR: Estimated Creatinine Clearance: 6 mL/min (A) (by C-G formula based on SCr of 14.82 mg/dL (H)). Liver Function Tests:  Recent Labs Lab 04/20/17 1921  AST 14*  ALT 8*  ALKPHOS 133*  BILITOT 0.8  PROT 7.9  ALBUMIN 3.3*    Recent Labs Lab 04/20/17 1921  LIPASE 48   No results for input(s): AMMONIA in the last 168 hours. Coagulation Profile:  Recent Labs Lab 04/21/17 0006 04/21/17 0140  INR 1.24 1.19   Cardiac Enzymes:  Recent Labs Lab 04/21/17 0006  TROPONINI 0.04*   BNP (last 3 results) No results for input(s): PROBNP in the last 8760 hours. HbA1C:  Recent Labs  04/21/17 0140  HGBA1C 5.2   CBG:  Recent Labs Lab 04/21/17 0822  GLUCAP 77   Lipid Profile: No results for input(s): CHOL, HDL, LDLCALC, TRIG, CHOLHDL,  LDLDIRECT in the last 72 hours. Thyroid Function Tests: No results for input(s): TSH, T4TOTAL, FREET4, T3FREE, THYROIDAB in the last 72 hours. Anemia Panel: No results for input(s): VITAMINB12, FOLATE, FERRITIN, TIBC, IRON, RETICCTPCT in the last 72 hours. Urine analysis:    Component Value Date/Time   COLORURINE YELLOW 10/18/2013 0419   APPEARANCEUR CLEAR 10/18/2013 0419   LABSPEC 1.008 10/18/2013 0419   PHURINE 8.5 (H) 10/18/2013 0419   GLUCOSEU 100 (A) 10/18/2013 0419   HGBUR TRACE (A) 10/18/2013 0419   BILIRUBINUR NEGATIVE 10/18/2013 0419   KETONESUR NEGATIVE 10/18/2013 0419   PROTEINUR 100 (A) 10/18/2013 0419   UROBILINOGEN 0.2 10/18/2013 0419   NITRITE NEGATIVE 10/18/2013 0419   LEUKOCYTESUR NEGATIVE 10/18/2013 0419     )No results found for this or any previous visit (from the past 240 hour(s)).    Anti-infectives    None       Radiology Studies: Ct Abdomen Pelvis Wo Contrast  Result Date: 04/21/2017 CLINICAL DATA:  Abdominal pain and vomiting. History of diabetes, renal insufficiency, anemia, dialysis. EXAM: CT ABDOMEN AND PELVIS WITHOUT CONTRAST TECHNIQUE: Multidetector CT imaging of the abdomen and pelvis was performed following the standard protocol without IV contrast. COMPARISON:  01/05/2017 FINDINGS: Lower chest: Atelectasis in the lung bases. Diffuse cardiac enlargement. Mild retrocrural lymphadenopathy. Hepatobiliary: Gallbladder is somewhat contracted and the wall appears mildly thickened. Can't exclude inflammatory process. No radiopaque stones are identified. No bile duct dilatation. No focal liver lesions. Pancreas: Unremarkable. No pancreatic ductal dilatation or surrounding inflammatory changes. Spleen: Normal in size without focal abnormality. Adrenals/Urinary Tract: No adrenal gland nodules. The kidneys are atrophic bilaterally. No hydronephrosis. Small cysts are demonstrated bilaterally. There is a 4.4 cm mass arising from the midportion of the right  kidney. This lesion was present previously and is unchanged in size. The lesion has a density measurement of 40 Hounsfield units. This suggests either a hemorrhagic cyst or a solid lesion. Renal cell carcinoma is not excluded. The lesion demonstrated significant increase in size between studies dated 03/11/2015 and 10/30/2015 but has remained stable in size since then. Bladder is decompressed. There is a left pelvic transplant kidney which appears to be decreased in attenuation, possibly hydronephrotic. There is extensive calcification in the hilar vessels. Stomach/Bowel: Stomach, small bowel, and colon are not abnormally distended. No wall thickening is demonstrated although lack of contrast material and under distention limit evaluation. The appendix is normal. Vascular/Lymphatic: Extensive vascular calcifications involving aorto iliac vessels. Retroperitoneal lymphadenopathy is demonstrated with periaortic nodes measuring up to 14 mm diameter. These are nonspecific in could be metastatic, reactive, or lymphoproliferative thin  origin. Similar appearance to previous studies. Reproductive: Prostate is unremarkable. Other: No free air or free fluid in the abdomen. Motion artifact limits the examination. Musculoskeletal: Diffusely increased mineralization throughout the visualized spine consistent with renal osteodystrophy. No destructive bone lesions. IMPRESSION: 1. No definite evidence of bowel obstruction or inflammation although motion artifact and lack of distention and contrast limit examination. 2. Bilateral renal atrophy and renal cysts. Indeterminate 4.4 cm mass from the midportion of the right kidney could represent a solid lesion. No change in size dating back to 10/30/2015. 3. Left pelvic transplant kidney may be hydronephrotic and demonstrate significant vascular calcification. 4. Extensive aortic atherosclerosis. 5. Bone changes consistent with renal osteodystrophy. 6. Diffuse cardiac enlargement. 7.  Mild retrocrural and retroperitoneal lymphadenopathy is indeterminate and could represent reactive change, metastatic disease, or lymphoproliferative disorder. No change since previous studies. Electronically Signed   By: Lucienne Capers M.D.   On: 04/21/2017 00:35   Dg Chest 1 View  Result Date: 04/21/2017 CLINICAL DATA:  Nausea, vomiting, and mid abdominal pain today. EXAM: CHEST 1 VIEW COMPARISON:  None 918 FINDINGS: Cardiac enlargement with mild central pulmonary vascular congestion. No edema or consolidation. No blunting of costophrenic angles. No pneumothorax. Calcification of the aorta. IMPRESSION: Cardiac enlargement with mild central pulmonary vascular congestion. No edema or consolidation. Electronically Signed   By: Lucienne Capers M.D.   On: 04/21/2017 01:01        Scheduled Meds: . amLODipine  10 mg Oral QHS  . calcitRIOL  3.75 mcg Oral Once per day on Mon Wed Fri  . cinacalcet  90 mg Oral Q supper  . [START ON 04/23/2017] cloNIDine  0.3 mg Transdermal Q Wed  . darbepoetin (ARANESP) injection - DIALYSIS  150 mcg Intravenous Q Mon-HD  . hydrALAZINE  100 mg Oral BID  . insulin aspart  0-9 Units Subcutaneous TID WC  . isosorbide mononitrate  60 mg Oral Daily  . pantoprazole  40 mg Oral BID AC  . sevelamer carbonate  2,400 mg Oral TID WC  . warfarin  10 mg Oral ONCE-1800  . Warfarin - Pharmacist Dosing Inpatient   Does not apply q1800   Continuous Infusions:   LOS: 1 day    Time spent: 35 min    [place], DO [facility] Pager [phone]  If 7PM-7AM, please contact night-coverage [email or web address] Password [email or web address] 04/21/2017, 1:09 PM

## 2017-04-21 NOTE — ED Notes (Signed)
Pt refusing to be hooked up to the cardiac moitor

## 2017-04-21 NOTE — Progress Notes (Signed)
[name] for Warfarin  Indication: History of PE/DVT  Allergies  Allergen Reactions  . Butalbital-Apap-Caffeine Shortness Of Breath, Swelling and Other (See Comments)    Swelling in throat  . Ferrlecit [Na Ferric Gluc Cplx In Sucrose] Shortness Of Breath, Swelling and Other (See Comments)    Swelling in throat, tolerates Venofor  . Minoxidil Shortness Of Breath  . Tylenol [Acetaminophen] Anaphylaxis and Swelling  . Darvocet [Propoxyphene N-Acetaminophen] Hives    Patient Measurements: Height: _0  (188 cm) Weight: 163 lb (73.9 kg) IBW/kg (Calculated) : 82.2  Vital Signs: BP: 173/137 (10/01 0630) Pulse Rate: 52 (10/01 0630)  Labs:  Recent Labs  04/20/17 1921 04/21/17 0006 04/21/17 0042 04/21/17 0140  HGB 9.7*  --   --   --   HCT 29.0*  --   --   --   PLT 164  --   --   --   LABPROT  --  15.5*  --  15.0  INR  --  1.24  --  1.19  CREATININE 14.04*  --  14.82*  --   TROPONINI  --  0.04*  --   --     Estimated Creatinine Clearance: 6 mL/min (A) (by C-G formula based on SCr of 14.82 mg/dL (H)).  Assessment: 51 y/o M with hx of DVT/PE on warfarin PTA, ESRD on HD, here with several episodes of emesis today. INR is subtherapeutic.   Goal of Therapy:  INR 2-3 Monitor platelets by anticoagulation protocol: Yes   Plan:  Repeat Warfarin 10 mg PO x 1 tonight Daily INR   Frank [name], Frank [name] 04/21/2017,11:09 AM

## 2017-04-21 NOTE — Progress Notes (Signed)
ANTICOAGULATION CONSULT NOTE - Initial Consult  Pharmacy Consult for Warfarin  Indication: History of PE/DVT  Allergies  Allergen Reactions  . Butalbital-Apap-Caffeine Shortness Of Breath, Swelling and Other (See Comments)    Swelling in throat  . Ferrlecit [Na Ferric Gluc Cplx In Sucrose] Shortness Of Breath, Swelling and Other (See Comments)    Swelling in throat, tolerates Venofor  . Minoxidil Shortness Of Breath  . Tylenol [Acetaminophen] Anaphylaxis and Swelling  . Darvocet [Propoxyphene N-Acetaminophen] Hives    Patient Measurements: Height: _0  (188 cm) Weight: 163 lb (73.9 kg) IBW/kg (Calculated) : 82.2  Vital Signs: Temp: 98.4 F (36.9 C) (09/30 1912) Temp Source: Oral (09/30 1912) BP: 194/110 (10/01 0030) Pulse Rate: 83 (10/01 0030)  Labs:  Recent Labs  04/20/17 1921 04/21/17 0006 04/21/17 0042  HGB 9.7*  --   --   HCT 29.0*  --   --   PLT 164  --   --   LABPROT  --  15.5*  --   INR  --  1.24  --   CREATININE 14.04*  --  14.82*  TROPONINI  --  0.04*  --     Estimated Creatinine Clearance: 6 mL/min (A) (by C-G formula based on SCr of 14.82 mg/dL (H)).   Medical History: Past Medical History:  Diagnosis Date  . Anemia   . Anxiety   . Chronic combined systolic and diastolic CHF (congestive heart failure) (HCC)    a. EF 20-25% by echo in 08/2015 b. echo 10/2015: EF 35-40%, diffuse HK, severe LAE, moderate RAE, small pericardial effusion  . Complication of anesthesia    itching, sore throat  . Depression   . Dialysis patient ([place])   . DVT (deep venous thrombosis) ([place]) 02/2017  . ESRD (end stage renal disease) ([place])    due to HTN per patient, followed at [facility], s/p failed kidney transplant - dialysis Tue, Th, Sat  . Hyperkalemia 12/2015  . Hypertension   . Junctional rhythm    a. noted in 08/2015: hyperkalemic at that time  b. 12/2015: presented in junctional rhythm w/ K+ of 6.6. Resolved with improvement of K+ levels.  . Nonischemic  cardiomyopathy ([place])    a. 08/2014: cath showing minimal CAD, but tortuous arteries noted.   . Personal history of DVT (deep vein thrombosis)/ PE 05/26/2016   In Oct 2015 had small subsemental LUL PE w/o DVT (LE dopplers neg) and was felt to be HD cath related, treated w coumadin.  IN May 2016 had small vein DVT (acute/subacute) in the R basilic/ brachial veins of the RUE, resumed on coumadin.  Had R sided HD cath at that time.    . Renal insufficiency   . Shortness of breath   . Type II diabetes mellitus (HCC)    No history per patient, but remains under history as A1c would not be accurate given on dialysis    Assessment: 51 y/o M with hx of DVT/PE on warfarin PTA, ESRD on HD, here with several episodes of emesis today, INR sub-therapeutic at 1.24, Hgb 9.7.   Goal of Therapy:  INR 2-3 Monitor platelets by anticoagulation protocol: Yes   Plan:  Warfarin 10 mg PO x 1 now Daily PT/INR Monitor for bleeding   Narda Bonds 04/21/2017,1:23 AM

## 2017-04-21 NOTE — ED Notes (Signed)
Pt given Applesauce per Bonnie(RN)

## 2017-04-21 NOTE — ED Notes (Signed)
Went to obtain blood collection, (PA) that spoke with pt about dialysis notified this NT that blood can be obtained at dialysis. Notified Bonnie(RN)

## 2017-04-22 DIAGNOSIS — F1210 Cannabis abuse, uncomplicated: Secondary | ICD-10-CM

## 2017-04-22 LAB — COMPREHENSIVE METABOLIC PANEL
ALBUMIN: 3.1 g/dL — AB (ref 3.5–5.0)
ALK PHOS: 102 U/L (ref 38–126)
ALT: 8 U/L — ABNORMAL LOW (ref 17–63)
ANION GAP: 11 (ref 5–15)
AST: 15 U/L (ref 15–41)
BILIRUBIN TOTAL: 1 mg/dL (ref 0.3–1.2)
BUN: 43 mg/dL — AB (ref 6–20)
CALCIUM: 8.1 mg/dL — AB (ref 8.9–10.3)
CO2: 28 mmol/L (ref 22–32)
CREATININE: 9.8 mg/dL — AB (ref 0.61–1.24)
Chloride: 98 mmol/L — ABNORMAL LOW (ref 101–111)
GFR calc Af Amer: 6 mL/min — ABNORMAL LOW (ref >60)
GFR calc non Af Amer: 5 mL/min — ABNORMAL LOW (ref >60)
GLUCOSE: 84 mg/dL (ref 65–99)
Potassium: 4 mmol/L (ref 3.5–5.1)
Sodium: 137 mmol/L (ref 135–145)
TOTAL PROTEIN: 7.1 g/dL (ref 6.5–8.1)

## 2017-04-22 LAB — CBC
HCT: 26.1 % — ABNORMAL LOW (ref 39.0–52.0)
Hemoglobin: 8.7 g/dL — ABNORMAL LOW (ref 13.0–17.0)
MCH: 28.4 pg (ref 26.0–34.0)
MCHC: 33.3 g/dL (ref 30.0–36.0)
MCV: 85.3 fL (ref 78.0–100.0)
Platelets: 138 10*3/uL — ABNORMAL LOW (ref 150–400)
RBC: 3.06 MIL/uL — ABNORMAL LOW (ref 4.22–5.81)
RDW: 16 % — AB (ref 11.5–15.5)
WBC: 3.7 10*3/uL — ABNORMAL LOW (ref 4.0–10.5)

## 2017-04-22 LAB — PROTIME-INR
INR: 1.37
INR: 1.5
PROTHROMBIN TIME: 16.7 s — AB (ref 11.4–15.2)
PROTHROMBIN TIME: 18 s — AB (ref 11.4–15.2)

## 2017-04-22 LAB — TROPONIN I
TROPONIN I: 0.05 ng/mL — AB (ref <0.03)
Troponin I: 0.06 ng/mL (ref <0.03)

## 2017-04-22 LAB — GLUCOSE, CAPILLARY
GLUCOSE-CAPILLARY: 82 mg/dL (ref 65–99)
Glucose-Capillary: 125 mg/dL — ABNORMAL HIGH (ref 65–99)

## 2017-04-22 IMAGING — CR DG ABDOMEN 1V
1 series · 1 of 1 positions shown · non-contrast
Comparison: CT scan of November 05, 2014. Radiographs May 12, 2014.

CLINICAL DATA: Vomiting for 4 days.

EXAM:
ABDOMEN - 1 VIEW

[x abdomen supine]
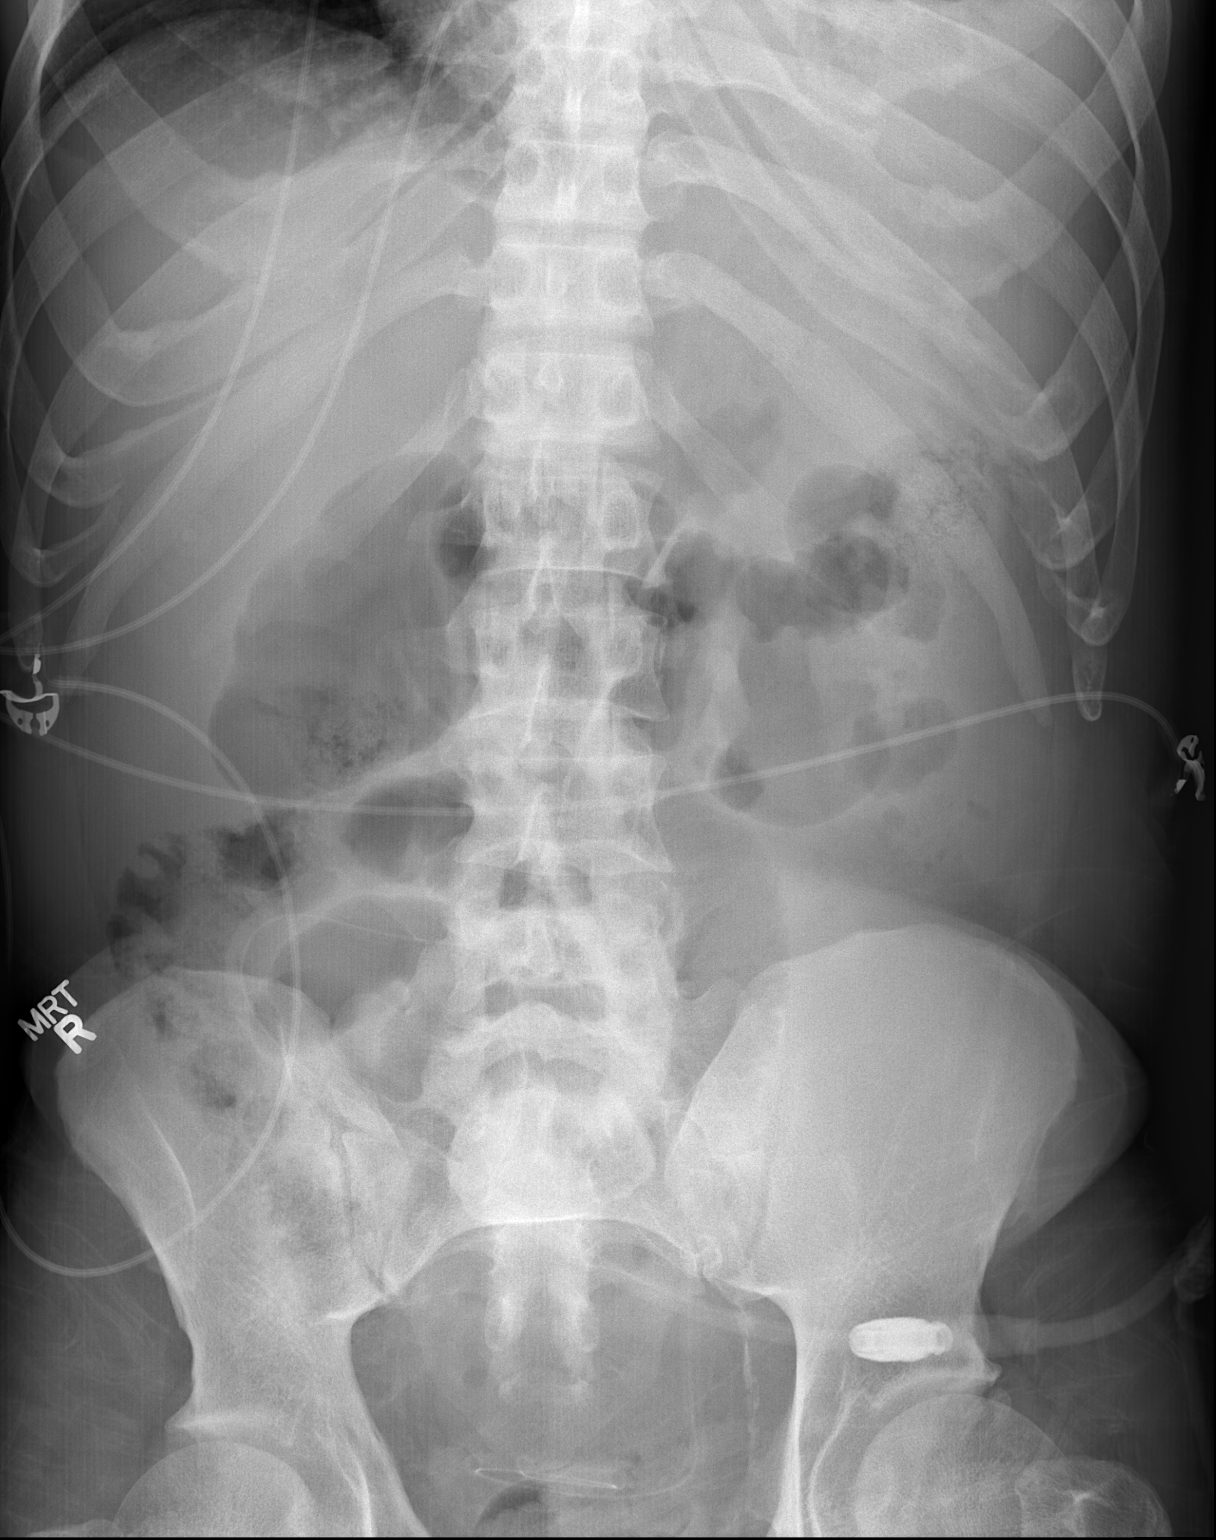

[1 of 1 positions shown; findings below may reference images not displayed]

FINDINGS: Peritoneal dialysis catheter is noted in the pelvis. Mildly dilated
small bowel loops are noted in the lower abdomen. No colonic
dilatation is noted. No radio-opaque calculi or other significant
radiographic abnormality are seen.
IMPRESSION: Mildly dilated small bowel loops are noted in the lower abdomen
which may represent focal ileus. Followup radiographs are
recommended to rule out obstruction.

## 2017-04-22 MED ORDER — LIDOCAINE-PRILOCAINE 2.5-2.5 % EX CREA: 1.0000 "application " | TOPICAL_CREAM | CUTANEOUS | Status: DC | PRN

## 2017-04-22 MED ORDER — SODIUM CHLORIDE 0.9 % IV SOLN: 100.0000 mL | INTRAVENOUS | Status: DC | PRN

## 2017-04-22 MED ORDER — PENTAFLUOROPROP-TETRAFLUOROETH EX AERO: 1.0000 "application " | INHALATION_SPRAY | CUTANEOUS | Status: DC | PRN

## 2017-04-22 MED ORDER — SEVELAMER CARBONATE 800 MG PO TABS: 2400.0000 mg | ORAL_TABLET | Freq: Three times a day (TID) | ORAL | 0 refills | Status: DC

## 2017-04-22 MED ORDER — ALTEPLASE 2 MG IJ SOLR: 2.0000 mg | Freq: Once | INTRAMUSCULAR | Status: DC | PRN

## 2017-04-22 MED ORDER — METOCLOPRAMIDE HCL 5 MG PO TABS: 5.0000 mg | ORAL_TABLET | Freq: Three times a day (TID) | ORAL | 0 refills | Status: DC | PRN

## 2017-04-22 MED ORDER — LIDOCAINE HCL (PF) 1 % IJ SOLN: 5.0000 mL | INTRAMUSCULAR | Status: DC | PRN

## 2017-04-22 MED ORDER — HEPARIN SODIUM (PORCINE) 1000 UNIT/ML DIALYSIS: 1000.0000 [IU] | INTRAMUSCULAR | Status: DC | PRN

## 2017-04-22 MED ORDER — ONDANSETRON 8 MG PO TBDP: ORAL_TABLET | ORAL | 0 refills | Status: DC

## 2017-04-22 MED ORDER — HEPARIN SODIUM (PORCINE) 1000 UNIT/ML DIALYSIS: 20.0000 [IU]/kg | INTRAMUSCULAR | Status: DC | PRN

## 2017-04-22 MED ORDER — WARFARIN SODIUM 5 MG PO TABS: 7.5000 mg | ORAL_TABLET | Freq: Every day | ORAL | Status: DC

## 2017-04-22 MED ORDER — COUMADIN BOOK
Freq: Once | Status: DC
  Filled 2017-04-22: qty 1

## 2017-04-22 NOTE — Progress Notes (Signed)
Patient refused nasal swabbing for MRSA. Frank [name], Frank [name], Therapist, sports

## 2017-04-22 NOTE — Progress Notes (Signed)
Discharge papers provided to pt. Refused discharge review with RN. Denies any questions or concerns prior to discharge. Discharged to home. Ambulatory off unit in stable condition.

## 2017-04-22 NOTE — Care Management Note (Signed)
Case Management Note  Patient Details  Name: Frank [name] MRN: [id number] Date of Birth: 1966-02-21  Subjective/Objective:    CM following for progression and d/c planning.                 Action/Plan: 05/10/2017 Noted pt remains on Coumadin, pharmacist Rod Holler met with pt re INR followup. This CM attempted to arrange INR checks at [facility] where pt has been seen in the past however no appointments were available and pt would need to call back next week. This CM attempted to get just lab work done, however they were not able to do this until the pt has an appointment and now were available the pt was instructed to call after Monday Apr 28, 2017 for an appointment . Additionally we were unable to schedule this pt at the [facility] for the next two weeks. Noted in d/c instructions that the pt is to call his PCP, Dr Kern Alberta at [facility] for an appointment next week. This was discussed with the pt at the time of d/c per his RN.   Expected Discharge Date:  04/22/17               Expected Discharge Plan:  Home/Self Care  In-House Referral:  NA  Discharge planning Services  CM Consult  Post Acute Care Choice:  NA Choice offered to:  NA  DME Arranged:  N/A DME Agency:  NA  HH Arranged:  NA HH Agency:  NA  Status of Service:  Completed, signed off  If discussed at [place] of Stay Meetings, dates discussed:    Additional Comments:  Adron Bene, RN 04/22/2017, 11:52 AM

## 2017-04-22 NOTE — Discharge Instructions (Addendum)

## 2017-04-22 NOTE — Progress Notes (Signed)
Dr. Eliseo Squires notified pt demanding he needs to be discharged this morning in order to make it to dialysis @ his clinic @ 11:30am. Will continue to monitor.

## 2017-04-22 NOTE — Discharge Summary (Signed)
 Physician Discharge Summary  Frank [name] MRN:[id number] DOB: 02/27/1966 DOA: 04/20/2017  PCP: Bowline, Isai G, MD  Admit date: 04/20/2017 Discharge date: 04/22/2017   Recommendations for Outpatient Follow-Up:   Monitor INR- check on Thursday-- patinet has not been taking consistantly  Discharge Diagnosis:   Principal Problem:   Hyperkalemia Active Problems:   Nausea & vomiting   DM (diabetes mellitus), type 2, uncontrolled, with renal complications (HCC)   ESRD on hemodialysis (HCC)   Abdominal pain   Marijuana abuse   Discharge disposition:  Home  Discharge Condition: Improved.  Diet recommendation: renal/carb mod  Wound care: None.   History of Present Illness:   Frank [name]  is a 51 y.o. male,  Dm2, hypertension, DV/PE, ESRD on HD (T, T, S), apparently presented to ED for evaluation of n/v, and abdominal pain.  x2 days,  "sharp" epgiastric, intermittent.  No hematemesis.  Pt denies fever, chills, diarrhea, brbpr, black stool.  Pt doesn't think anything made his abd pain better or worse.   In ED,  Bun/creat 71/14.04,  K=6.2,  Ast 14, Al 8, Alk phos 133, T. Bili 0.8,  Lipase 48, Alb 3.3,  Wbc 4.4, Hgb 9.7  EKG nsr at 70, nl axis,  T inversion in v5, v6.  No peaked T waves.   pt will be admitted for hyperkalemia and n/v, abd pain,.    Hospital Course by Problem:   N/v - suspect from marijuana abuse reglan PRN zofran PRN  Abdominal pain CT scan abd/pelvis shows some stable lymphadenopathy- outpatient follow up  Hyperkalemia Calcium gluconate Sodium bicarbonate Kayexalate S/p HD- patient misses HD due to "car issues"  ESRD on HD T,T, S -resume outpatient schedule  Anemia Defer to renal  DVT/PE Coumadin pharmacy to dose -new DVT in UE but patient will tolerate heparin Gtt-- has never had a therapeutic INR and per patinet has not been taking coumadin regularly.  Long discussion regarding options and he elects to leave hospital and continue coumadin  instead of staying on heparin gtt in house  Hypertension Cont current bp medication  Dm2 Carb mod diet  Protein calorie malnutrition,  Severe prostat  Retroperitoneal lymphadenopathy -defer to outpatient follow up    Medical Consultants:    renal   Discharge Exam:   Vitals:   04/21/17 2300 04/21/17 2310  BP: (!) 159/80 (!) 169/72  Pulse: 85 82  Resp: 17 17  Temp:  98.4 F (36.9 C)  SpO2:  95%   Vitals:   04/21/17 2200 04/21/17 2230 04/21/17 2300 04/21/17 2310  BP: (!) 167/98 (!) 168/100 (!) 159/80 (!) 169/72  Pulse: 80 87 85 82  Resp: 17 16 17 17  Temp:    98.4 F (36.9 C)  TempSrc:    Oral  SpO2:    95%  Weight:    71.6 kg (157 lb 13.6 oz)  Height:        Gen:  NAD   The results of significant diagnostics from this hospitalization (including imaging, microbiology, ancillary and laboratory) are listed below for reference.     Procedures and Diagnostic Studies:   Ct Abdomen Pelvis Wo Contrast  Result Date: 04/21/2017 CLINICAL DATA:  Abdominal pain and vomiting. History of diabetes, renal insufficiency, anemia, dialysis. EXAM: CT ABDOMEN AND PELVIS WITHOUT CONTRAST TECHNIQUE: Multidetector CT imaging of the abdomen and pelvis was performed following the standard protocol without IV contrast. COMPARISON:  01/05/2017 FINDINGS: Lower chest: Atelectasis in the lung bases. Diffuse cardiac enlargement. Mild retrocrural lymphadenopathy. Hepatobiliary:   Gallbladder is somewhat contracted and the wall appears mildly thickened. Can't exclude inflammatory process. No radiopaque stones are identified. No bile duct dilatation. No focal liver lesions. Pancreas: Unremarkable. No pancreatic ductal dilatation or surrounding inflammatory changes. Spleen: Normal in size without focal abnormality. Adrenals/Urinary Tract: No adrenal gland nodules. The kidneys are atrophic bilaterally. No hydronephrosis. Small cysts are demonstrated bilaterally. There is a 4.4 cm mass arising from  the midportion of the right kidney. This lesion was present previously and is unchanged in size. The lesion has a density measurement of 40 Hounsfield units. This suggests either a hemorrhagic cyst or a solid lesion. Renal cell carcinoma is not excluded. The lesion demonstrated significant increase in size between studies dated 03/11/2015 and 10/30/2015 but has remained stable in size since then. Bladder is decompressed. There is a left pelvic transplant kidney which appears to be decreased in attenuation, possibly hydronephrotic. There is extensive calcification in the hilar vessels. Stomach/Bowel: Stomach, small bowel, and colon are not abnormally distended. No wall thickening is demonstrated although lack of contrast material and under distention limit evaluation. The appendix is normal. Vascular/Lymphatic: Extensive vascular calcifications involving aorto iliac vessels. Retroperitoneal lymphadenopathy is demonstrated with periaortic nodes measuring up to 14 mm diameter. These are nonspecific in could be metastatic, reactive, or lymphoproliferative thin origin. Similar appearance to previous studies. Reproductive: Prostate is unremarkable. Other: No free air or free fluid in the abdomen. Motion artifact limits the examination. Musculoskeletal: Diffusely increased mineralization throughout the visualized spine consistent with renal osteodystrophy. No destructive bone lesions. IMPRESSION: 1. No definite evidence of bowel obstruction or inflammation although motion artifact and lack of distention and contrast limit examination. 2. Bilateral renal atrophy and renal cysts. Indeterminate 4.4 cm mass from the midportion of the right kidney could represent a solid lesion. No change in size dating back to 10/30/2015. 3. Left pelvic transplant kidney may be hydronephrotic and demonstrate significant vascular calcification. 4. Extensive aortic atherosclerosis. 5. Bone changes consistent with renal osteodystrophy. 6. Diffuse  cardiac enlargement. 7. Mild retrocrural and retroperitoneal lymphadenopathy is indeterminate and could represent reactive change, metastatic disease, or lymphoproliferative disorder. No change since previous studies. Electronically Signed   By: Lucienne Capers M.D.   On: 04/21/2017 00:35   Dg Chest 1 View  Result Date: 04/21/2017 CLINICAL DATA:  Nausea, vomiting, and mid abdominal pain today. EXAM: CHEST 1 VIEW COMPARISON:  None 918 FINDINGS: Cardiac enlargement with mild central pulmonary vascular congestion. No edema or consolidation. No blunting of costophrenic angles. No pneumothorax. Calcification of the aorta. IMPRESSION: Cardiac enlargement with mild central pulmonary vascular congestion. No edema or consolidation. Electronically Signed   By: Lucienne Capers M.D.   On: 04/21/2017 01:01     Labs:   Basic Metabolic Panel:  Recent Labs Lab 04/20/17 1921 04/21/17 0042 04/22/17 0112  NA 135 140 137  K 6.2* 4.4 4.0  CL 94* 100* 98*  CO2 21* 21* 28  GLUCOSE 83 46* 84  BUN 71* 78* 43*  CREATININE 14.04* 14.82* 9.80*  CALCIUM 7.5* 8.0* 8.1*   GFR Estimated Creatinine Clearance: 8.8 mL/min (A) (by C-G formula based on SCr of 9.8 mg/dL (H)). Liver Function Tests:  Recent Labs Lab 04/20/17 1921 04/22/17 0112  AST 14* 15  ALT 8* 8*  ALKPHOS 133* 102  BILITOT 0.8 1.0  PROT 7.9 7.1  ALBUMIN 3.3* 3.1*    Recent Labs Lab 04/20/17 1921  LIPASE 48   No results for input(s): AMMONIA in the last 168 hours. Coagulation profile  Recent Labs Lab 04/21/17 0006 04/21/17 0140 04/22/17 0112 04/22/17 0748  INR 1.24 1.19 1.37 1.50    CBC:  Recent Labs Lab 04/20/17 1921 04/21/17 2127 04/22/17 0112  WBC 4.4 5.2 3.7*  HGB 9.7* 8.7* 8.7*  HCT 29.0* 26.7* 26.1*  MCV 85.8 84.8 85.3  PLT 164 147* 138*   Cardiac Enzymes:  Recent Labs Lab 04/21/17 0006 04/22/17 0112  TROPONINI 0.04* 0.05*   BNP: Invalid input(s): POCBNP CBG:  Recent Labs Lab 04/21/17 0822  04/21/17 1850 04/22/17 0010 04/22/17 0809  GLUCAP 77 102* 82 125*   D-Dimer No results for input(s): DDIMER in the last 72 hours. Hgb A1c  Recent Labs  04/21/17 0140  HGBA1C 5.2   Lipid Profile No results for input(s): CHOL, HDL, LDLCALC, TRIG, CHOLHDL, LDLDIRECT in the last 72 hours. Thyroid function studies No results for input(s): TSH, T4TOTAL, T3FREE, THYROIDAB in the last 72 hours.  Invalid input(s): FREET3 Anemia work up No results for input(s): VITAMINB12, FOLATE, FERRITIN, TIBC, IRON, RETICCTPCT in the last 72 hours. Microbiology No results found for this or any previous visit (from the past 240 hour(s)).   Discharge Instructions:   Discharge Instructions    Discharge instructions    Complete by:  As directed    Resume HD t/th/sat INR check on Thursday- patient [facility] Renal/carb mod diet Avoid marijuana   Increase activity slowly    Complete by:  As directed      Allergies as of 04/22/2017      Reactions   Butalbital-apap-caffeine Shortness Of Breath, Swelling, Other (See Comments)   Swelling in throat   Ferrlecit [na Ferric Gluc Cplx In Sucrose] Shortness Of Breath, Swelling, Other (See Comments)   Swelling in throat, tolerates Venofor   Minoxidil Shortness Of Breath   Tylenol [acetaminophen] Anaphylaxis, Swelling   Darvocet [propoxyphene N-acetaminophen] Hives      Medication List    TAKE these medications   amLODipine 10 MG tablet Commonly known as:  NORVASC Take 1 tablet (10 mg total) by mouth at bedtime.   cloNIDine 0.3 mg/24hr patch Commonly known as:  CATAPRES - Dosed in mg/24 hr Place 0.3 mg onto the skin once a week. On Wednesday   hydrALAZINE 100 MG tablet Commonly known as:  APRESOLINE Take 1 tablet (100 mg total) by mouth 2 (two) times daily.   isosorbide mononitrate 60 MG 24 hr tablet Commonly known as:  IMDUR Take 1 tablet (60 mg total) by mouth daily.   metoCLOPramide 5 MG  tablet Commonly known as:  REGLAN Take 1 tablet (5 mg total) by mouth every 8 (eight) hours as needed for nausea.   ondansetron 8 MG disintegrating tablet Commonly known as:  ZOFRAN ODT 8mg ODT q4 hours prn nausea   pantoprazole 40 MG tablet Commonly known as:  PROTONIX Take 1 tablet (40 mg total) by mouth 2 (two) times daily before a meal.   sevelamer carbonate 800 MG tablet Commonly known as:  RENVELA Take 3 tablets (2,400 mg total) by mouth 3 (three) times daily with meals. What changed:  how much to take   traMADol 50 MG tablet Commonly known as:  ULTRAM Take 1 tablet (50 mg total) by mouth every 12 (twelve) hours as needed for moderate pain or severe pain.   warfarin 5 MG tablet Commonly known as:  COUMADIN Take 1.5 tablets (7.5 mg total) by mouth daily.      Follow-up Information    Bowline,   Isai G, MD Follow up in 1 week(s).   Contact information: [address] [place] [address] [phone]            Time coordinating discharge: 35 min  Signed:  JESSICA U VANN   [facility] 04/22/2017, 9:03 AM          

## 2017-04-22 NOTE — Progress Notes (Signed)
Dr. Eliseo Squires notified of elevated BP; manual obtained-176/84. Pt is due for dialysis today at clinic. Per MD, OK to discharge home. Also clarified sub therapeutic INR; per MD, pt non-compliant & no further needs. Pharmacist provided education regarding coumadin and importance of having INR levels checked as directed. Pt verbalizes understanding.

## 2017-04-22 NOTE — Progress Notes (Signed)
[facility] Progress Note   Subjective:  Seen in room. No CP, dyspnea this morning. Reports cramping overnight and does not want his usual HD today. Wants to be discharged and have outpt dialysis tomorrow instead. I called his clinic and confirmed that is possible. He can be there at 12:15pm tomorrow. Discussed that his INR is not therapeutic. Will leave final decision to his hospitalist.   Objective Vitals:   04/21/17 2200 04/21/17 2230 04/21/17 2300 04/21/17 2310  BP: (!) 167/98 (!) 168/100 (!) 159/80 (!) 169/72  Pulse: 80 87 85 82  Resp: _0 Temp:    98.4 F (36.9 C)  TempSrc:    Oral  SpO2:    95%  Weight:    71.6 kg (157 lb 13.6 oz)  Height:       Physical Exam General: Well appearing, NAD. Heart: RRR; no murmur Lungs: CTAB Extremities: No LE edema Dialysis Access:  L AVF + thrill/bruit  Additional Objective Labs: Basic Metabolic Panel:  Recent Labs Lab 04/20/17 1921 04/21/17 0042 04/22/17 0112  NA 135 140 137  K 6.2* 4.4 4.0  CL 94* 100* 98*  CO2 21* 21* 28  GLUCOSE 83 46* 84  BUN 71* 78* 43*  CREATININE 14.04* 14.82* 9.80*  CALCIUM 7.5* 8.0* 8.1*   Liver Function Tests:  Recent Labs Lab 04/20/17 1921 04/22/17 0112  AST 14* 15  ALT 8* 8*  ALKPHOS 133* 102  BILITOT 0.8 1.0  PROT 7.9 7.1  ALBUMIN 3.3* 3.1*    Recent Labs Lab 04/20/17 1921  LIPASE 48   CBC:  Recent Labs Lab 04/20/17 1921 04/21/17 2127 04/22/17 0112  WBC 4.4 5.2 3.7*  HGB 9.7* 8.7* 8.7*  HCT 29.0* 26.7* 26.1*  MCV 85.8 84.8 85.3  PLT 164 147* 138*   Cardiac Enzymes:  Recent Labs Lab 04/21/17 0006 04/22/17 0112  TROPONINI 0.04* 0.05*   CBG:  Recent Labs Lab 04/21/17 0822 04/21/17 1850 04/22/17 0010 04/22/17 0809  GLUCAP 77 102* 82 125*   Studies/Results: Ct Abdomen Pelvis Wo Contrast  Result Date: 04/21/2017 CLINICAL DATA:  Abdominal pain and vomiting. History of diabetes, renal insufficiency, anemia, dialysis. EXAM: CT ABDOMEN  AND PELVIS WITHOUT CONTRAST TECHNIQUE: Multidetector CT imaging of the abdomen and pelvis was performed following the standard protocol without IV contrast. COMPARISON:  01/05/2017 FINDINGS: Lower chest: Atelectasis in the lung bases. Diffuse cardiac enlargement. Mild retrocrural lymphadenopathy. Hepatobiliary: Gallbladder is somewhat contracted and the wall appears mildly thickened. Can't exclude inflammatory process. No radiopaque stones are identified. No bile duct dilatation. No focal liver lesions. Pancreas: Unremarkable. No pancreatic ductal dilatation or surrounding inflammatory changes. Spleen: Normal in size without focal abnormality. Adrenals/Urinary Tract: No adrenal gland nodules. The kidneys are atrophic bilaterally. No hydronephrosis. Small cysts are demonstrated bilaterally. There is a 4.4 cm mass arising from the midportion of the right kidney. This lesion was present previously and is unchanged in size. The lesion has a density measurement of 40 Hounsfield units. This suggests either a hemorrhagic cyst or a solid lesion. Renal cell carcinoma is not excluded. The lesion demonstrated significant increase in size between studies dated 03/11/2015 and 10/30/2015 but has remained stable in size since then. Bladder is decompressed. There is a left pelvic transplant kidney which appears to be decreased in attenuation, possibly hydronephrotic. There is extensive calcification in the hilar vessels. Stomach/Bowel: Stomach, small bowel, and colon are not abnormally distended. No wall thickening is demonstrated although lack of contrast material and under distention  limit evaluation. The appendix is normal. Vascular/Lymphatic: Extensive vascular calcifications involving aorto iliac vessels. Retroperitoneal lymphadenopathy is demonstrated with periaortic nodes measuring up to 14 mm diameter. These are nonspecific in could be metastatic, reactive, or lymphoproliferative thin origin. Similar appearance to previous  studies. Reproductive: Prostate is unremarkable. Other: No free air or free fluid in the abdomen. Motion artifact limits the examination. Musculoskeletal: Diffusely increased mineralization throughout the visualized spine consistent with renal osteodystrophy. No destructive bone lesions. IMPRESSION: 1. No definite evidence of bowel obstruction or inflammation although motion artifact and lack of distention and contrast limit examination. 2. Bilateral renal atrophy and renal cysts. Indeterminate 4.4 cm mass from the midportion of the right kidney could represent a solid lesion. No change in size dating back to 10/30/2015. 3. Left pelvic transplant kidney may be hydronephrotic and demonstrate significant vascular calcification. 4. Extensive aortic atherosclerosis. 5. Bone changes consistent with renal osteodystrophy. 6. Diffuse cardiac enlargement. 7. Mild retrocrural and retroperitoneal lymphadenopathy is indeterminate and could represent reactive change, metastatic disease, or lymphoproliferative disorder. No change since previous studies. Electronically Signed   By: Lucienne Capers M.D.   On: 04/21/2017 00:35   Dg Chest 1 View  Result Date: 04/21/2017 CLINICAL DATA:  Nausea, vomiting, and mid abdominal pain today. EXAM: CHEST 1 VIEW COMPARISON:  None 918 FINDINGS: Cardiac enlargement with mild central pulmonary vascular congestion. No edema or consolidation. No blunting of costophrenic angles. No pneumothorax. Calcification of the aorta. IMPRESSION: Cardiac enlargement with mild central pulmonary vascular congestion. No edema or consolidation. Electronically Signed   By: Lucienne Capers M.D.   On: 04/21/2017 01:01   Medications: . sodium chloride    . sodium chloride    . sodium chloride    . sodium chloride     . amLODipine  10 mg Oral QHS  . calcitRIOL  3.75 mcg Oral Once per day on Mon Wed Fri  . cinacalcet  90 mg Oral Q supper  . [START ON 04/23/2017] cloNIDine  0.3 mg Transdermal Q Wed  .  darbepoetin (ARANESP) injection - DIALYSIS  150 mcg Intravenous Q Mon-HD  . hydrALAZINE  100 mg Oral BID  . insulin aspart  0-9 Units Subcutaneous TID WC  . isosorbide mononitrate  60 mg Oral Daily  . pantoprazole  40 mg Oral BID AC  . sevelamer carbonate  2,400 mg Oral TID WC  . Warfarin - Pharmacist Dosing Inpatient   Does not apply q1800    Dialysis Orders: TTS at [facility] 4hr, BFR 400, DFR 800, EDW 71kg, 2K/2.25Ca, AVF, Heparin 2000 bolus - Mircera 226mg IV q 2 weeks (last given 9/6) - Calcitriol 3.765m PO q HD  Assessment/Plan: 1.  Hyperkalemia: Improved s/p kayexalate and HD 10/1. 2.  ESRD: S/p urgent HD 10/1 for high K. Due for regular HD today, he does not want it. Requesting to be discharged today and have next HD tomorrow ([facility]). I told him it will be up to his hospitalist as he has other issues that may require extended admit (low INR). 3.  Hypertension/volume: BP remains high, got close to his EDW with last HD. Will continue to work on getting there. 4.  Anemia: Hgb 8.7, s/p Aranesp 15040m10/85/9.  Metabolic bone disease: Ca ok. Continue Calcitriol, PO Sensipar 88m24mnd Renvela 3/meals. 6.  Type 2 DM: On insulin, per primary. 7.  Hx RUE DVT (Dx 03/11/17, s/p admit for anticoagulation, left AMA): On warfarin, low INR. Per primary.  Veneta Penton, PA-C 04/22/2017, 9:28 AM  [facility] Pager: [phone]  Pt seen, examined and agree w A/P as above.  Kelly Splinter MD [employer] pager [phone]   04/22/2017, 11:44 AM

## 2017-04-24 DIAGNOSIS — R0789 Other chest pain: Secondary | ICD-10-CM | POA: Diagnosis not present

## 2017-04-24 DIAGNOSIS — I878 Other specified disorders of veins: Secondary | ICD-10-CM | POA: Diagnosis not present

## 2017-04-24 DIAGNOSIS — N186 End stage renal disease: Secondary | ICD-10-CM | POA: Diagnosis not present

## 2017-04-24 DIAGNOSIS — I82621 Acute embolism and thrombosis of deep veins of right upper extremity: Secondary | ICD-10-CM | POA: Diagnosis not present

## 2017-04-24 DIAGNOSIS — R1110 Vomiting, unspecified: Secondary | ICD-10-CM | POA: Diagnosis not present

## 2017-04-24 DIAGNOSIS — Z992 Dependence on renal dialysis: Secondary | ICD-10-CM | POA: Diagnosis not present

## 2017-04-24 DIAGNOSIS — N2581 Secondary hyperparathyroidism of renal origin: Secondary | ICD-10-CM | POA: Diagnosis not present

## 2017-04-24 DIAGNOSIS — I517 Cardiomegaly: Secondary | ICD-10-CM | POA: Diagnosis not present

## 2017-04-24 DIAGNOSIS — R0781 Pleurodynia: Secondary | ICD-10-CM | POA: Diagnosis not present

## 2017-04-24 DIAGNOSIS — Z79899 Other long term (current) drug therapy: Secondary | ICD-10-CM | POA: Diagnosis not present

## 2017-04-24 DIAGNOSIS — D631 Anemia in chronic kidney disease: Secondary | ICD-10-CM | POA: Diagnosis not present

## 2017-04-24 DIAGNOSIS — Z7901 Long term (current) use of anticoagulants: Secondary | ICD-10-CM | POA: Diagnosis not present

## 2017-04-24 DIAGNOSIS — R079 Chest pain, unspecified: Secondary | ICD-10-CM | POA: Diagnosis not present

## 2017-04-24 DIAGNOSIS — T45516A Underdosing of anticoagulants, initial encounter: Secondary | ICD-10-CM | POA: Diagnosis not present

## 2017-04-24 DIAGNOSIS — D68318 Other hemorrhagic disorder due to intrinsic circulating anticoagulants, antibodies, or inhibitors: Secondary | ICD-10-CM | POA: Diagnosis not present

## 2017-04-24 DIAGNOSIS — Z23 Encounter for immunization: Secondary | ICD-10-CM | POA: Diagnosis not present

## 2017-04-24 DIAGNOSIS — R0989 Other specified symptoms and signs involving the circulatory and respiratory systems: Secondary | ICD-10-CM | POA: Diagnosis not present

## 2017-04-24 DIAGNOSIS — Z888 Allergy status to other drugs, medicaments and biological substances status: Secondary | ICD-10-CM | POA: Diagnosis not present

## 2017-04-24 DIAGNOSIS — E119 Type 2 diabetes mellitus without complications: Secondary | ICD-10-CM | POA: Diagnosis not present

## 2017-04-24 DIAGNOSIS — R791 Abnormal coagulation profile: Secondary | ICD-10-CM | POA: Diagnosis not present

## 2017-04-24 DIAGNOSIS — R0600 Dyspnea, unspecified: Secondary | ICD-10-CM | POA: Diagnosis not present

## 2017-04-26 DIAGNOSIS — N2889 Other specified disorders of kidney and ureter: Secondary | ICD-10-CM | POA: Diagnosis not present

## 2017-04-26 DIAGNOSIS — I10 Essential (primary) hypertension: Secondary | ICD-10-CM | POA: Diagnosis not present

## 2017-04-26 DIAGNOSIS — R1010 Upper abdominal pain, unspecified: Secondary | ICD-10-CM | POA: Diagnosis not present

## 2017-04-26 DIAGNOSIS — N261 Atrophy of kidney (terminal): Secondary | ICD-10-CM | POA: Diagnosis not present

## 2017-04-26 DIAGNOSIS — R112 Nausea with vomiting, unspecified: Secondary | ICD-10-CM | POA: Diagnosis not present

## 2017-04-26 DIAGNOSIS — K828 Other specified diseases of gallbladder: Secondary | ICD-10-CM | POA: Diagnosis not present

## 2017-04-26 DIAGNOSIS — I700 Atherosclerosis of aorta: Secondary | ICD-10-CM | POA: Diagnosis not present

## 2017-04-26 DIAGNOSIS — E119 Type 2 diabetes mellitus without complications: Secondary | ICD-10-CM | POA: Diagnosis not present

## 2017-04-26 DIAGNOSIS — Z885 Allergy status to narcotic agent status: Secondary | ICD-10-CM | POA: Diagnosis not present

## 2017-04-26 DIAGNOSIS — R1084 Generalized abdominal pain: Secondary | ICD-10-CM | POA: Diagnosis not present

## 2017-04-26 DIAGNOSIS — Z79899 Other long term (current) drug therapy: Secondary | ICD-10-CM | POA: Diagnosis not present

## 2017-04-26 DIAGNOSIS — N250 Renal osteodystrophy: Secondary | ICD-10-CM | POA: Diagnosis not present

## 2017-04-26 DIAGNOSIS — N281 Cyst of kidney, acquired: Secondary | ICD-10-CM | POA: Diagnosis not present

## 2017-04-26 DIAGNOSIS — Z7901 Long term (current) use of anticoagulants: Secondary | ICD-10-CM | POA: Diagnosis not present

## 2017-04-26 DIAGNOSIS — N289 Disorder of kidney and ureter, unspecified: Secondary | ICD-10-CM | POA: Diagnosis not present

## 2017-04-26 DIAGNOSIS — I517 Cardiomegaly: Secondary | ICD-10-CM | POA: Diagnosis not present

## 2017-04-26 DIAGNOSIS — R160 Hepatomegaly, not elsewhere classified: Secondary | ICD-10-CM | POA: Diagnosis not present

## 2017-04-26 DIAGNOSIS — Z888 Allergy status to other drugs, medicaments and biological substances status: Secondary | ICD-10-CM | POA: Diagnosis not present

## 2017-04-26 DIAGNOSIS — R188 Other ascites: Secondary | ICD-10-CM | POA: Diagnosis not present

## 2017-04-26 DIAGNOSIS — Z992 Dependence on renal dialysis: Secondary | ICD-10-CM | POA: Diagnosis not present

## 2017-04-27 ENCOUNTER — Emergency Department (HOSPITAL_COMMUNITY): Payer: Medicare Other

## 2017-04-27 ENCOUNTER — Encounter (HOSPITAL_COMMUNITY): Payer: Self-pay | Admitting: *Deleted

## 2017-04-27 ENCOUNTER — Inpatient Hospital Stay (HOSPITAL_COMMUNITY)
Admission: EM | Admit: 2017-04-27 | Discharge: 2017-04-28 | DRG: 640 | Discharge status: Against Medical Advice | Payer: Medicare Other | Attending: Internal Medicine | Admitting: Internal Medicine

## 2017-04-27 DIAGNOSIS — E1129 Type 2 diabetes mellitus with other diabetic kidney complication: Secondary | ICD-10-CM | POA: Diagnosis present

## 2017-04-27 DIAGNOSIS — Z9115 Patient's noncompliance with renal dialysis: Secondary | ICD-10-CM

## 2017-04-27 DIAGNOSIS — I152 Hypertension secondary to endocrine disorders: Secondary | ICD-10-CM | POA: Diagnosis present

## 2017-04-27 DIAGNOSIS — IMO0002 Reserved for concepts with insufficient information to code with codable children: Secondary | ICD-10-CM | POA: Diagnosis present

## 2017-04-27 DIAGNOSIS — F419 Anxiety disorder, unspecified: Secondary | ICD-10-CM | POA: Diagnosis present

## 2017-04-27 DIAGNOSIS — E875 Hyperkalemia: Principal | ICD-10-CM

## 2017-04-27 DIAGNOSIS — I10 Essential (primary) hypertension: Secondary | ICD-10-CM | POA: Diagnosis not present

## 2017-04-27 DIAGNOSIS — Z79899 Other long term (current) drug therapy: Secondary | ICD-10-CM

## 2017-04-27 DIAGNOSIS — R9431 Abnormal electrocardiogram [ECG] [EKG]: Secondary | ICD-10-CM | POA: Diagnosis not present

## 2017-04-27 DIAGNOSIS — Y830 Surgical operation with transplant of whole organ as the cause of abnormal reaction of the patient, or of later complication, without mention of misadventure at the time of the procedure: Secondary | ICD-10-CM | POA: Diagnosis present

## 2017-04-27 DIAGNOSIS — I82409 Acute embolism and thrombosis of unspecified deep veins of unspecified lower extremity: Secondary | ICD-10-CM | POA: Diagnosis not present

## 2017-04-27 DIAGNOSIS — D649 Anemia, unspecified: Secondary | ICD-10-CM | POA: Diagnosis present

## 2017-04-27 DIAGNOSIS — R1010 Upper abdominal pain, unspecified: Secondary | ICD-10-CM | POA: Diagnosis not present

## 2017-04-27 DIAGNOSIS — K219 Gastro-esophageal reflux disease without esophagitis: Secondary | ICD-10-CM | POA: Diagnosis present

## 2017-04-27 DIAGNOSIS — Z992 Dependence on renal dialysis: Secondary | ICD-10-CM | POA: Diagnosis not present

## 2017-04-27 DIAGNOSIS — Z794 Long term (current) use of insulin: Secondary | ICD-10-CM

## 2017-04-27 DIAGNOSIS — F329 Major depressive disorder, single episode, unspecified: Secondary | ICD-10-CM | POA: Diagnosis present

## 2017-04-27 DIAGNOSIS — E1159 Type 2 diabetes mellitus with other circulatory complications: Secondary | ICD-10-CM | POA: Diagnosis present

## 2017-04-27 DIAGNOSIS — E1165 Type 2 diabetes mellitus with hyperglycemia: Secondary | ICD-10-CM | POA: Diagnosis present

## 2017-04-27 DIAGNOSIS — N186 End stage renal disease: Secondary | ICD-10-CM

## 2017-04-27 DIAGNOSIS — R001 Bradycardia, unspecified: Secondary | ICD-10-CM | POA: Diagnosis not present

## 2017-04-27 DIAGNOSIS — I120 Hypertensive chronic kidney disease with stage 5 chronic kidney disease or end stage renal disease: Secondary | ICD-10-CM | POA: Diagnosis not present

## 2017-04-27 DIAGNOSIS — Z87891 Personal history of nicotine dependence: Secondary | ICD-10-CM

## 2017-04-27 DIAGNOSIS — E1122 Type 2 diabetes mellitus with diabetic chronic kidney disease: Secondary | ICD-10-CM | POA: Diagnosis not present

## 2017-04-27 DIAGNOSIS — I132 Hypertensive heart and chronic kidney disease with heart failure and with stage 5 chronic kidney disease, or end stage renal disease: Secondary | ICD-10-CM | POA: Diagnosis present

## 2017-04-27 DIAGNOSIS — Z7901 Long term (current) use of anticoagulants: Secondary | ICD-10-CM | POA: Diagnosis not present

## 2017-04-27 DIAGNOSIS — Z885 Allergy status to narcotic agent status: Secondary | ICD-10-CM

## 2017-04-27 DIAGNOSIS — F4323 Adjustment disorder with mixed anxiety and depressed mood: Secondary | ICD-10-CM | POA: Diagnosis present

## 2017-04-27 DIAGNOSIS — Z888 Allergy status to other drugs, medicaments and biological substances status: Secondary | ICD-10-CM

## 2017-04-27 DIAGNOSIS — E8889 Other specified metabolic disorders: Secondary | ICD-10-CM | POA: Diagnosis present

## 2017-04-27 DIAGNOSIS — I5042 Chronic combined systolic (congestive) and diastolic (congestive) heart failure: Secondary | ICD-10-CM | POA: Diagnosis present

## 2017-04-27 DIAGNOSIS — E785 Hyperlipidemia, unspecified: Secondary | ICD-10-CM | POA: Diagnosis present

## 2017-04-27 DIAGNOSIS — Z86718 Personal history of other venous thrombosis and embolism: Secondary | ICD-10-CM | POA: Diagnosis not present

## 2017-04-27 DIAGNOSIS — Z9119 Patient's noncompliance with other medical treatment and regimen: Secondary | ICD-10-CM

## 2017-04-27 DIAGNOSIS — Z86711 Personal history of pulmonary embolism: Secondary | ICD-10-CM | POA: Diagnosis not present

## 2017-04-27 DIAGNOSIS — I429 Cardiomyopathy, unspecified: Secondary | ICD-10-CM | POA: Diagnosis present

## 2017-04-27 DIAGNOSIS — T8612 Kidney transplant failure: Secondary | ICD-10-CM | POA: Diagnosis present

## 2017-04-27 DIAGNOSIS — D631 Anemia in chronic kidney disease: Secondary | ICD-10-CM | POA: Diagnosis not present

## 2017-04-27 DIAGNOSIS — E119 Type 2 diabetes mellitus without complications: Secondary | ICD-10-CM | POA: Diagnosis not present

## 2017-04-27 DIAGNOSIS — Z886 Allergy status to analgesic agent status: Secondary | ICD-10-CM

## 2017-04-27 DIAGNOSIS — R112 Nausea with vomiting, unspecified: Secondary | ICD-10-CM | POA: Diagnosis not present

## 2017-04-27 DIAGNOSIS — N289 Disorder of kidney and ureter, unspecified: Secondary | ICD-10-CM | POA: Diagnosis not present

## 2017-04-27 DIAGNOSIS — Z9114 Patient's other noncompliance with medication regimen: Secondary | ICD-10-CM

## 2017-04-27 DIAGNOSIS — I517 Cardiomegaly: Secondary | ICD-10-CM | POA: Diagnosis not present

## 2017-04-27 HISTORY — DX: Acute embolism and thrombosis of unspecified deep veins of unspecified lower extremity: I82.409

## 2017-04-27 LAB — PROTIME-INR
INR: 1.38
Prothrombin Time: 16.9 seconds — ABNORMAL HIGH (ref 11.4–15.2)

## 2017-04-27 LAB — BASIC METABOLIC PANEL
ANION GAP: 22 — AB (ref 5–15)
BUN: 92 mg/dL — ABNORMAL HIGH (ref 6–20)
CO2: 19 mmol/L — AB (ref 22–32)
Calcium: 8.5 mg/dL — ABNORMAL LOW (ref 8.9–10.3)
Chloride: 93 mmol/L — ABNORMAL LOW (ref 101–111)
Creatinine, Ser: 15.38 mg/dL — ABNORMAL HIGH (ref 0.61–1.24)
GFR calc non Af Amer: 3 mL/min — ABNORMAL LOW (ref >60)
GFR, EST AFRICAN AMERICAN: 4 mL/min — AB (ref >60)
Glucose, Bld: 61 mg/dL — ABNORMAL LOW (ref 65–99)
Potassium: >7.5 mmol/L (ref 3.5–5.1)
SODIUM: 134 mmol/L — AB (ref 135–145)

## 2017-04-27 LAB — HEPATIC FUNCTION PANEL
ALBUMIN: 3.5 g/dL (ref 3.5–5.0)
ALK PHOS: 127 U/L — AB (ref 38–126)
ALT: 15 U/L — AB (ref 17–63)
AST: 24 U/L (ref 15–41)
Bilirubin, Direct: 0.2 mg/dL (ref 0.1–0.5)
Indirect Bilirubin: 1 mg/dL — ABNORMAL HIGH (ref 0.3–0.9)
TOTAL PROTEIN: 8 g/dL (ref 6.5–8.1)
Total Bilirubin: 1.2 mg/dL (ref 0.3–1.2)

## 2017-04-27 LAB — CBG MONITORING, ED
GLUCOSE-CAPILLARY: 462 mg/dL — AB (ref 65–99)
Glucose-Capillary: 51 mg/dL — ABNORMAL LOW (ref 65–99)

## 2017-04-27 LAB — CBC
HEMATOCRIT: 30.2 % — AB (ref 39.0–52.0)
HEMOGLOBIN: 9.7 g/dL — AB (ref 13.0–17.0)
MCH: 27.6 pg (ref 26.0–34.0)
MCHC: 32.1 g/dL (ref 30.0–36.0)
MCV: 86 fL (ref 78.0–100.0)
Platelets: 182 10*3/uL (ref 150–400)
RBC: 3.51 MIL/uL — AB (ref 4.22–5.81)
RDW: 15.7 % — ABNORMAL HIGH (ref 11.5–15.5)
WBC: 5.6 10*3/uL (ref 4.0–10.5)

## 2017-04-27 LAB — I-STAT TROPONIN, ED: Troponin i, poc: 0.03 ng/mL (ref 0.00–0.08)

## 2017-04-27 LAB — LIPASE, BLOOD: LIPASE: 27 U/L (ref 11–51)

## 2017-04-27 MED ORDER — HEPARIN SODIUM (PORCINE) 1000 UNIT/ML DIALYSIS: 1000.0000 [IU] | INTRAMUSCULAR | Status: DC | PRN

## 2017-04-27 MED ORDER — INSULIN ASPART 100 UNIT/ML IV SOLN
10.0000 [IU] | Freq: Once | INTRAVENOUS | Status: AC
  Administered 2017-04-27: 10 [IU] via INTRAVENOUS
  Filled 2017-04-27: qty 0.1

## 2017-04-27 MED ORDER — ONDANSETRON HCL 4 MG PO TABS: 4.0000 mg | ORAL_TABLET | Freq: Four times a day (QID) | ORAL | Status: DC | PRN

## 2017-04-27 MED ORDER — OXYCODONE HCL 5 MG PO TABS
ORAL_TABLET | ORAL | Status: AC
  Administered 2017-04-28: 10 mg via ORAL
  Filled 2017-04-27: qty 2

## 2017-04-27 MED ORDER — WARFARIN SODIUM 2.5 MG PO TABS
12.5000 mg | ORAL_TABLET | ORAL | Status: AC
  Administered 2017-04-27: 12.5 mg via ORAL
  Filled 2017-04-27: qty 1

## 2017-04-27 MED ORDER — ONDANSETRON 4 MG PO TBDP: 4.0000 mg | ORAL_TABLET | Freq: Three times a day (TID) | ORAL | Status: DC | PRN

## 2017-04-27 MED ORDER — ISOSORBIDE MONONITRATE ER 60 MG PO TB24
60.0000 mg | ORAL_TABLET | Freq: Every day | ORAL | Status: DC
  Administered 2017-04-28 (×2): 60 mg via ORAL
  Filled 2017-04-27 (×2): qty 1

## 2017-04-27 MED ORDER — SODIUM CHLORIDE 0.9 % IV SOLN: 100.0000 mL | INTRAVENOUS | Status: DC | PRN

## 2017-04-27 MED ORDER — AMLODIPINE BESYLATE 10 MG PO TABS
10.0000 mg | ORAL_TABLET | Freq: Every day | ORAL | Status: DC
  Administered 2017-04-28: 10 mg via ORAL
  Filled 2017-04-27: qty 1

## 2017-04-27 MED ORDER — PENTAFLUOROPROP-TETRAFLUOROETH EX AERO: 1.0000 "application " | INHALATION_SPRAY | CUTANEOUS | Status: DC | PRN

## 2017-04-27 MED ORDER — OXYCODONE HCL 5 MG PO TABS
10.0000 mg | ORAL_TABLET | Freq: Once | ORAL | Status: AC
  Administered 2017-04-28: 10 mg via ORAL

## 2017-04-27 MED ORDER — HEPARIN SODIUM (PORCINE) 1000 UNIT/ML DIALYSIS: 2000.0000 [IU] | Freq: Once | INTRAMUSCULAR | Status: DC

## 2017-04-27 MED ORDER — HEPARIN SODIUM (PORCINE) 5000 UNIT/ML IJ SOLN: 5000.0000 [IU] | Freq: Three times a day (TID) | INTRAMUSCULAR | Status: DC

## 2017-04-27 MED ORDER — ONDANSETRON HCL 4 MG/2ML IJ SOLN: 4.0000 mg | Freq: Four times a day (QID) | INTRAMUSCULAR | Status: DC | PRN

## 2017-04-27 MED ORDER — CLONIDINE HCL 0.3 MG/24HR TD PTWK: 0.3000 mg | MEDICATED_PATCH | TRANSDERMAL | Status: DC

## 2017-04-27 MED ORDER — DEXTROSE 50 % IV SOLN
12.5000 g | Freq: Once | INTRAVENOUS | Status: AC
  Administered 2017-04-27: 12.5 g via INTRAVENOUS
  Filled 2017-04-27: qty 50

## 2017-04-27 MED ORDER — LIDOCAINE-PRILOCAINE 2.5-2.5 % EX CREA: 1.0000 "application " | TOPICAL_CREAM | CUTANEOUS | Status: DC | PRN

## 2017-04-27 MED ORDER — DEXTROSE 50 % IV SOLN
1.0000 | Freq: Once | INTRAVENOUS | Status: AC
  Administered 2017-04-27: 50 mL via INTRAVENOUS
  Filled 2017-04-27: qty 50

## 2017-04-27 MED ORDER — ACETAMINOPHEN 325 MG PO TABS: 650.0000 mg | ORAL_TABLET | Freq: Four times a day (QID) | ORAL | Status: DC | PRN

## 2017-04-27 MED ORDER — OXYCODONE HCL 5 MG PO TABS
10.0000 mg | ORAL_TABLET | Freq: Once | ORAL | Status: AC
  Administered 2017-04-27: 10 mg via ORAL

## 2017-04-27 MED ORDER — PROMETHAZINE HCL 25 MG/ML IJ SOLN
25.0000 mg | Freq: Once | INTRAMUSCULAR | Status: AC
  Administered 2017-04-27: 25 mg via INTRAVENOUS
  Filled 2017-04-27: qty 1

## 2017-04-27 MED ORDER — LIDOCAINE HCL (PF) 1 % IJ SOLN: 5.0000 mL | INTRAMUSCULAR | Status: DC | PRN

## 2017-04-27 MED ORDER — SODIUM CHLORIDE 0.9 % IV SOLN
1.0000 g | Freq: Once | INTRAVENOUS | Status: AC
  Administered 2017-04-27: 1 g via INTRAVENOUS
  Filled 2017-04-27 (×2): qty 10

## 2017-04-27 MED ORDER — INSULIN ASPART 100 UNIT/ML ~~LOC~~ SOLN
SUBCUTANEOUS | Status: AC
  Filled 2017-04-27: qty 1

## 2017-04-27 MED ORDER — WARFARIN - PHARMACIST DOSING INPATIENT: Freq: Every day | Status: DC

## 2017-04-27 MED ORDER — SEVELAMER CARBONATE 800 MG PO TABS
2400.0000 mg | ORAL_TABLET | Freq: Three times a day (TID) | ORAL | Status: DC
  Administered 2017-04-28: 2400 mg via ORAL
  Filled 2017-04-27: qty 3

## 2017-04-27 MED ORDER — PANTOPRAZOLE SODIUM 40 MG PO TBEC
40.0000 mg | DELAYED_RELEASE_TABLET | Freq: Two times a day (BID) | ORAL | Status: DC
  Administered 2017-04-28: 40 mg via ORAL
  Filled 2017-04-27: qty 1

## 2017-04-27 MED ORDER — ACETAMINOPHEN 650 MG RE SUPP: 650.0000 mg | Freq: Four times a day (QID) | RECTAL | Status: DC | PRN

## 2017-04-27 MED ORDER — OXYCODONE HCL 5 MG PO TABS
ORAL_TABLET | ORAL | Status: AC
  Administered 2017-04-27: 10 mg via ORAL
  Filled 2017-04-27: qty 2

## 2017-04-27 MED ORDER — ALTEPLASE 2 MG IJ SOLR: 2.0000 mg | Freq: Once | INTRAMUSCULAR | Status: DC | PRN

## 2017-04-27 MED ORDER — HYDRALAZINE HCL 50 MG PO TABS
100.0000 mg | ORAL_TABLET | Freq: Two times a day (BID) | ORAL | Status: DC
  Administered 2017-04-28 (×2): 100 mg via ORAL
  Filled 2017-04-27 (×2): qty 2

## 2017-04-27 NOTE — ED Provider Notes (Addendum)
[facility] DEPT Provider Note   CSN: [id number] Arrival date & time: 04/27/17  1717     History   Chief Complaint Chief Complaint  Patient presents with  . Shortness of Breath  . Vascular Access Problem    HPI Frank [name] is a 51 y.o. male.  HPI  51 year old man end-stage renal dialysis Tuesday Thursday Saturdays missed dialysis yesterday presents today saying he feels like his potassium is high, he is dyspneic, and generally weak. He also continues to have nausea and vomiting. He states he did not make it to dialysis yesterday due to his truck breaking down.  Past Medical History:  Diagnosis Date  . Anemia   . Anxiety   . Chronic combined systolic and diastolic CHF (congestive heart failure) (HCC)    a. EF 20-25% by echo in 08/2015 b. echo 10/2015: EF 35-40%, diffuse HK, severe LAE, moderate RAE, small pericardial effusion  . Complication of anesthesia    itching, sore throat  . Depression   . Dialysis patient ([place])   . DVT (deep venous thrombosis) ([place]) 02/2017  . ESRD (end stage renal disease) ([place])    due to HTN per patient, followed at [facility], s/p failed kidney transplant - dialysis Tue, Th, Sat  . Hyperkalemia 12/2015  . Hypertension   . Junctional rhythm    a. noted in 08/2015: hyperkalemic at that time  b. 12/2015: presented in junctional rhythm w/ K+ of 6.6. Resolved with improvement of K+ levels.  . Nonischemic cardiomyopathy ([place])    a. 08/2014: cath showing minimal CAD, but tortuous arteries noted.   . Personal history of DVT (deep vein thrombosis)/ PE 05/26/2016   In Oct 2015 had small subsemental LUL PE w/o DVT (LE dopplers neg) and was felt to be HD cath related, treated w coumadin.  IN May 2016 had small vein DVT (acute/subacute) in the R basilic/ brachial veins of the RUE, resumed on coumadin.  Had R sided HD cath at that time.    . Renal insufficiency   . Shortness of breath   . Type II diabetes mellitus (HCC)    No history per patient, but remains  under history as A1c would not be accurate given on dialysis    Patient Active Problem List   Diagnosis Date Noted  . Marijuana abuse 04/21/2017  . Acute DVT (deep venous thrombosis) ([place]) 03/13/2017  . DVT (deep venous thrombosis) ([place]) 03/11/2017  . Anemia 02/24/2017  . Volume overload 01/13/2017  . Aortic atherosclerosis ([place]) 01/05/2017  . Abdominal pain 08/04/2016  . Uremia 06/07/2016  . Hyperkalemia 05/29/2016  . GERD (gastroesophageal reflux disease) 05/29/2016  . Personal history of DVT (deep vein thrombosis)/ PE 05/26/2016  . Nonischemic cardiomyopathy ([place]) 01/09/2016  . Bilateral low back pain without sciatica   . Renal cyst, left 10/30/2015  . Constipation by delayed colonic transit 10/30/2015  . Acute on chronic combined systolic and diastolic congestive heart failure ([place]) 09/23/2015  . Chest pain 09/08/2015  . Adjustment disorder with mixed anxiety and depressed mood 08/20/2015  . Essential hypertension 01/02/2015  . Dyslipidemia   . Malignant hypertension 11/29/2014  . ESRD on hemodialysis ([place])   . Acute pulmonary edema (HCC)   . DM (diabetes mellitus), type 2, uncontrolled, with renal complications ([place])   . Complex sleep apnea syndrome 05/05/2014  . Anemia of chronic kidney failure 06/24/2013  . Nausea & vomiting 06/24/2013    Past Surgical History:  Procedure Laterality Date  . CAPD INSERTION    .  CAPD REMOVAL    . INGUINAL HERNIA REPAIR Right 02/14/2015   Procedure: REPAIR INCARCERATED RIGHT INGUINAL HERNIA;  Surgeon: [name], MD;  Location: [facility];  Service: General;  Laterality: Right;  . INSERTION OF DIALYSIS CATHETER Right 09/23/2015   Procedure: exchange of Right internal Dialysis Catheter.;  Surgeon: [name], MD;  Location: [facility];  Service: Vascular;  Laterality: Right;  . IR GENERIC HISTORICAL  07/16/2016   IR US GUIDE VASC ACCESS LEFT 07/16/2016 Corrie Mckusick, DO [facility]-INTERV RAD  . IR GENERIC HISTORICAL Left 07/16/2016   IR THROMBECTOMY  AV FISTULA W/THROMBOLYSIS/PTA INC/SHUNT/IMG LEFT 07/16/2016 Corrie Mckusick, DO [facility]-INTER[facility] RAD  . KIDNEY RECEIPIENT  2006   failed and started HD in March 2014  . LEFT HEART CATHETERIZATION WITH CORONARY ANGIOGRAM N/A 09/02/2014   Procedure: LEFT HEART CATHETERIZATION WITH CORONARY ANGIOGRAM;  Surgeon: [name], MD;  Location: [facility] CATH LAB;  Service: Cardiovascular;  Laterality: N/A;       Home Medications    Prior to Admission medications   Medication Sig Start Date End Date Taking? Authorizing Provider  amLODipine (NORVASC) 10 MG tablet Take 1 tablet (10 mg total) by mouth at bedtime. 12/01/16   Rice, Resa Miner, MD  cloNIDine (CATAPRES - DOSED IN MG/24 HR) 0.3 mg/24hr patch Place 0.3 mg onto the skin once a week. On Wednesday  12/31/16   [provider]  hydrALAZINE (APRESOLINE) 100 MG tablet Take 1 tablet (100 mg total) by mouth 2 (two) times daily. 01/07/17   Ledell Noss, MD  isosorbide mononitrate (IMDUR) 60 MG 24 hr tablet Take 1 tablet (60 mg total) by mouth daily. 01/08/17   Ledell Noss, MD  metoCLOPramide (REGLAN) 5 MG tablet Take 1 tablet (5 mg total) by mouth every 8 (eight) hours as needed for nausea. 04/22/17   [name], DO  ondansetron (ZOFRAN ODT) 8 MG disintegrating tablet 71m ODT q4 hours prn nausea 04/22/17   VEulogio BearU, DO  pantoprazole (PROTONIX) 40 MG tablet Take 1 tablet (40 mg total) by mouth 2 (two) times daily before a meal. 12/01/16   Rice, CResa Miner MD  sevelamer carbonate (RENVELA) 800 MG tablet Take 3 tablets (2,400 mg total) by mouth 3 (three) times daily with meals. 04/22/17   VGeradine Girt DO  traMADol (ULTRAM) 50 MG tablet Take 1 tablet (50 mg total) by mouth every 12 (twelve) hours as needed for moderate pain or severe pain. 12/01/16   RCollier Salina MD  warfarin (COUMADIN) 5 MG tablet Take 1.5 tablets (7.5 mg total) by mouth daily. 04/22/17   VGeradine Girt DO    Family History Family History  Problem Relation Age of Onset    . Hypertension Other     Social History Social History  Substance Use Topics  . Smoking status: Former Smoker    Packs/day: 0.00    Years: 1.00    Types: Cigarettes  . Smokeless tobacco: Never Used     Comment: quit Jan 2014  . Alcohol use No     Allergies   Butalbital-apap-caffeine; Ferrlecit [na ferric gluc cplx in sucrose]; Minoxidil; Tylenol [acetaminophen]; and Darvocet [propoxyphene n-acetaminophen]   Review of Systems Review of Systems  All other systems reviewed and are negative.    Physical Exam Updated Vital Signs BP (!) 158/111 (BP Location: Right Arm)   Pulse (!) 52   Temp 97.8 F (36.6 C) (Oral)   Resp 18   Ht 1.88 m (_0 )   Wt  73.5 kg (162 lb)   SpO2 100%   BMI 20.80 kg/m   Physical Exam   ED Treatments / Results  Labs (all labs ordered are listed, but only abnormal results are displayed) Labs Reviewed  CBG MONITORING, ED - Abnormal; Notable for the following:       Result Value   Glucose-Capillary 51 (*)    All other components within normal limits  BASIC METABOLIC PANEL  CBC  I-STAT TROPONIN, ED    EKG  EKG Interpretation  Date/Time:  Sunday April 27 2017 17:29:21 EDT Ventricular Rate:  51 PR Interval:  240 QRS Duration: 110 QT Interval:  552 QTC Calculation: 508 R Axis:   -68 Text Interpretation:  Sinus bradycardia with 1st degree A-V block Left anterior fascicular block Anterior infarct , age undetermined Prolonged QT Abnormal ECG new TWI in septal leads with compared to most recent EKG from 04/20/17 No STEMI Confirmed by Addison Lank [phone]) on 04/27/2017 5:44:23 PM       Radiology No results found.  Procedures Angiocath insertion Date/Time: 04/27/2017 7:44 PM Performed by: Pattricia Boss Authorized by: Pattricia Boss  Consent: The procedure was performed in an emergent situation. Verbal consent obtained. Consent given by: patient Patient identity confirmed: verbally with patient Time out: Immediately prior to  procedure a "time out" was called to verify the correct patient, procedure, equipment, support staff and site/side marked as required. Preparation: Patient was prepped and draped in the usual sterile fashion. Local anesthesia used: no  Anesthesia: Local anesthesia used: no  Sedation: Patient sedated: no Patient tolerance: Patient tolerated the procedure well with no immediate complications Comments: Left ej cannulated with 20 gauge48 mm    (including critical care time)  Medications Ordered in ED Medications - No data to display   Initial Impression / Assessment and Plan / ED Course  I have reviewed the triage vital signs and the nursing notes.  Pertinent labs & imaging results that were available during my care of the patient were reviewed by me and considered in my medical decision making (see chart for details).  Clinical Course as of Apr 27 1948  Nancy Fetter Apr 27, 2017  1947 Hyperkalemia treatment order set ordered with insulin, D50, and calcium  [DR]    Clinical Course User Index [DR] Pattricia Boss, MD   Discussed with Dr. Jonnie Finner and he will arrange for hemodialysis Patient bradycardic with normal blood pressure. EKG consistent with hyperkalemia CRITICAL CARE Performed by: Shaune Pollack Total critical care time: 65 minutes Critical care time was exclusive of separately billable procedures and treating other patients. Critical care was necessary to treat or prevent imminent or life-threatening deterioration. Critical care was time spent personally by me on the following activities: development of treatment plan with patient and/or surrogate as well as nursing, discussions with consultants, evaluation of patient's response to treatment, examination of patient, obtaining history from patient or surrogate, ordering and performing treatments and interventions, ordering and review of laboratory studies, ordering and review of radiographic studies, pulse oximetry and re-evaluation of  patient's condition.  Repeat EKG improved with heart rate increased to 70 and narrowing of QRS complexes. Patient is currently in dialysis. He'll be admitted to hospitalist service discussed with Dr. Aggie Moats. Final Clinical Impressions(s) / ED Diagnoses   Final diagnoses:  ESRD (end stage renal disease) ([place])  Hyperkalemia  Abnormal EKG  Bradycardia    New Prescriptions New Prescriptions   No medications on file     Pattricia Boss, MD 04/27/17 1949  Pattricia Boss, MD 04/27/17 2112

## 2017-04-27 NOTE — ED Triage Notes (Signed)
Pt is dialysis pt, reports last treatment was Thursday, he missed Saturday treatment and now reports high potassium level and sob. Airway intact at triage and ekg done.

## 2017-04-27 NOTE — H&P (Signed)
[facility] History and Physical  JONTY MORRICAL EKC:[id number] DOB: June 22, 1966 DOA: 04/27/2017  Referring physician:  PCP: Lolita Patella, MD   Chief Complaint: "I didn't have a ride."  HPI: Frank [name] is a 51 y.o. male  with past mental history of anemia, anxiety, depression end-stage renal disease presents with nausea. Patient states he's had some nausea vomiting. Some abdominal comfort as after. Missed dialysis on Saturday due to lack of transportation. Has a history of noncompliance. Has a history of high potassium.  ED course: Labs showed hyperkalemia. Given IV fluids, glucose and insulin as well as calcium chloride. Nephrology consult it for emergent dialysis. Hospitalist consulted for admission.   Review of Systems:  As per HPI otherwise 10 point review of systems negative.    Past Medical History:  Diagnosis Date  . Anemia   . Anxiety   . Chronic combined systolic and diastolic CHF (congestive heart failure) (HCC)    a. EF 20-25% by echo in 08/2015 b. echo 10/2015: EF 35-40%, diffuse HK, severe LAE, moderate RAE, small pericardial effusion  . Complication of anesthesia    itching, sore throat  . Depression   . Dialysis patient ([place])   . DVT (deep venous thrombosis) ([place]) 02/2017  . ESRD (end stage renal disease) ([place])    due to HTN per patient, followed at [facility], s/p failed kidney transplant - dialysis Tue, Th, Sat  . Hyperkalemia 12/2015  . Hypertension   . Junctional rhythm    a. noted in 08/2015: hyperkalemic at that time  b. 12/2015: presented in junctional rhythm w/ K+ of 6.6. Resolved with improvement of K+ levels.  . Nonischemic cardiomyopathy ([place])    a. 08/2014: cath showing minimal CAD, but tortuous arteries noted.   . Personal history of DVT (deep vein thrombosis)/ PE 05/26/2016   In Oct 2015 had small subsemental LUL PE w/o DVT (LE dopplers neg) and was felt to be HD cath related, treated w coumadin.  IN May 2016 had small vein DVT (acute/subacute) in  the R basilic/ brachial veins of the RUE, resumed on coumadin.  Had R sided HD cath at that time.    . Renal insufficiency   . Shortness of breath   . Type II diabetes mellitus (HCC)    No history per patient, but remains under history as A1c would not be accurate given on dialysis   Past Surgical History:  Procedure Laterality Date  . CAPD INSERTION    . CAPD REMOVAL    . INGUINAL HERNIA REPAIR Right 02/14/2015   Procedure: REPAIR INCARCERATED RIGHT INGUINAL HERNIA;  Surgeon: [name], MD;  Location: [facility];  Service: General;  Laterality: Right;  . INSERTION OF DIALYSIS CATHETER Right 09/23/2015   Procedure: exchange of Right internal Dialysis Catheter.;  Surgeon: [name], MD;  Location: [facility];  Service: Vascular;  Laterality: Right;  . IR GENERIC HISTORICAL  07/16/2016   IR US GUIDE VASC ACCESS LEFT 07/16/2016 Corrie Mckusick, DO [facility]-INTER[facility]  . IR GENERIC HISTORICAL Left 07/16/2016   IR THROMBECTOMY AV FISTULA W/THROMBOLYSIS/PTA INC/SHUNT/IMG LEFT 07/16/2016 Corrie Mckusick, DO [facility]INTER[facility]  . KIDNEY RECEIPIENT  2006   failed and started HD in March 2014  . LEFT HEART CATHETERIZATION WITH CORONARY ANGIOGRAM N/A 09/02/2014   Procedure: LEFT HEART CATHETERIZATION WITH CORONARY ANGIOGRAM;  Surgeon: [name], MD;  Location: [facility] CATH LAB;  Service: Cardiovascular;  Laterality: N/A;   Social History:  reports that he has quit smoking. His smoking  use included Cigarettes. He smoked 0.00 packs per day for 1.00 year. He has never used smokeless tobacco. He reports that he uses drugs, including Marijuana. He reports that he does not drink alcohol.  Allergies  Allergen Reactions  . Butalbital-Apap-Caffeine Shortness Of Breath, Swelling and Other (See Comments)    Swelling in throat  . Ferrlecit [Na Ferric Gluc Cplx In Sucrose] Shortness Of Breath, Swelling and Other (See Comments)    Swelling in throat, tolerates Venofor  . Minoxidil Shortness Of Breath  . Tylenol [Acetaminophen]  Anaphylaxis and Swelling  . Darvocet [Propoxyphene N-Acetaminophen] Hives    Family History  Problem Relation Age of Onset  . Hypertension Other      Prior to Admission medications   Medication Sig Start Date End Date Taking? Authorizing Provider  amLODipine (NORVASC) 10 MG tablet Take 1 tablet (10 mg total) by mouth at bedtime. 12/01/16  Yes Rice, Resa Miner, MD  cloNIDine (CATAPRES - DOSED IN MG/24 HR) 0.3 mg/24hr patch Place 0.3 mg onto the skin once a week. On Wednesday  12/31/16  Yes [provider]  hydrALAZINE (APRESOLINE) 100 MG tablet Take 1 tablet (100 mg total) by mouth 2 (two) times daily. 01/07/17  Yes Ledell Noss, MD  isosorbide mononitrate (IMDUR) 60 MG 24 hr tablet Take 1 tablet (60 mg total) by mouth daily. 01/08/17  Yes Ledell Noss, MD  metoCLOPramide (REGLAN) 5 MG tablet Take 1 tablet (5 mg total) by mouth every 8 (eight) hours as needed for nausea. 04/22/17  Yes [name], DO  ondansetron (ZOFRAN ODT) 8 MG disintegrating tablet 61m ODT q4 hours prn nausea 04/22/17  Yes Vann, Jessica U, DO  pantoprazole (PROTONIX) 40 MG tablet Take 1 tablet (40 mg total) by mouth 2 (two) times daily before a meal. 12/01/16  Yes Rice, CResa Miner MD  sevelamer carbonate (RENVELA) 800 MG tablet Take 3 tablets (2,400 mg total) by mouth 3 (three) times daily with meals. 04/22/17  Yes Vann, Jessica U, DO  traMADol (ULTRAM) 50 MG tablet Take 1 tablet (50 mg total) by mouth every 12 (twelve) hours as needed for moderate pain or severe pain. 12/01/16  Yes Rice, CResa Miner MD  warfarin (COUMADIN) 5 MG tablet Take 1.5 tablets (7.5 mg total) by mouth daily. 04/22/17  Yes VGeradine Girt DO   Physical Exam: Vitals:   04/27/17 2030 04/27/17 2115 04/27/17 2125 04/27/17 2130  BP: (!) 182/118 (!) 169/116 (!) 171/107 (!) 167/114  Pulse:  77 69 78  Resp: 12 16    Temp:  98 F (36.7 C)    TempSrc:      SpO2:      Weight:  73.5 kg (162 lb 0.6 oz)    Height:        Wt Readings from  Last 3 Encounters:  04/27/17 73.5 kg (162 lb 0.6 oz)  04/21/17 71.6 kg (157 lb 13.6 oz)  03/30/17 73.5 kg (162 lb)    General:  Appears calm and comfortable; A&Ox3 Eyes:  PERRL, EOMI, normal lids, iris ENT:  grossly normal hearing, lips & tongue Neck:  no LAD, masses or thyromegaly Cardiovascular:  RRR, no m/r/g. No LE edema.  Respiratory:  CTA bilaterally, no w/r/r. Normal respiratory effort. Abdomen:  soft, ntnd Skin:  no rash or induration seen on limited exam Musculoskeletal:  grossly normal tone BUE/BLE Psychiatric:  grossly normal mood and affect, speech fluent and appropriate Neurologic:  CN 2-12 grossly intact, moves all extremities in coordinated fashion.  Labs on Admission:  Basic Metabolic Panel:  Recent Labs Lab 04/21/17 0042 04/22/17 0112 04/27/17 1840  NA 140 137 134*  K 4.4 4.0 >7.5*  CL 100* 98* 93*  CO2 21* 28 19*  GLUCOSE 46* 84 61*  BUN 78* 43* 92*  CREATININE 14.82* 9.80* 15.38*  CALCIUM 8.0* 8.1* 8.5*   Liver Function Tests:  Recent Labs Lab 04/22/17 0112 04/27/17 1840  AST 15 24  ALT 8* 15*  ALKPHOS 102 127*  BILITOT 1.0 1.2  PROT 7.1 8.0  ALBUMIN 3.1* 3.5    Recent Labs Lab 04/27/17 1840  LIPASE 27   No results for input(s): AMMONIA in the last 168 hours. CBC:  Recent Labs Lab 04/21/17 2127 04/22/17 0112 04/27/17 1840  WBC 5.2 3.7* 5.6  HGB 8.7* 8.7* 9.7*  HCT 26.7* 26.1* 30.2*  MCV 84.8 85.3 86.0  PLT 147* 138* 182   Cardiac Enzymes:  Recent Labs Lab 04/21/17 0006 04/22/17 0112 04/22/17 0748  TROPONINI 0.04* 0.05* 0.06*    BNP (last 3 results) No results for input(s): BNP in the last 8760 hours.  ProBNP (last 3 results) No results for input(s): PROBNP in the last 8760 hours.   Serum creatinine: 15.38 mg/dL (H) 04/27/17 1840 Estimated creatinine clearance: 5.8 mL/min (A)  CBG:  Recent Labs Lab 04/21/17 1850 04/22/17 0010 04/22/17 0809 04/27/17 1810 04/27/17 2016  GLUCAP 102* 82 125* 51*  462*    Radiological Exams on Admission: Dg Chest Portable 1 View  Result Date: 04/27/2017 CLINICAL DATA:  Dialysis patient, reporting elevated potassium levels and dyspnea after missing a treatment yesterday. EXAM: PORTABLE CHEST 1 VIEW COMPARISON:  04/21/2017 FINDINGS: Unchanged moderate enlargement of the cardiac silhouette. The lungs are clear. Normal pulmonary vasculature. Unremarkable mediastinal and hilar contours. No pleural effusions are evident. IMPRESSION: Stable cardiomegaly.  No consolidation or large effusion. Electronically Signed   By: Andreas Newport M.D.   On: 04/27/2017 19:05    EKG: Independently reviewed.   Assessment/Plan Active Problems:   Hyperkalemia  Elevated K Emergent dialysis Repeat labs in the morning Given potassium stabilization and emergency room  End-stage renal disease Nephrology consult by EDP Continue Renvela  Hypertension  continue clonidine, hydralazine, Imdur 4, Norvasc  Reflux Continue Protonix  Pain chronic Stop tramadol as it can lower the seizure threshold   Code Status: FC  DVT Prophylaxis: heparin Family Communication: none at bedside Disposition Plan: Pending Improvement  Status: inpt tele  Elwin Mocha, MD Family Medicine [facility] [email or web address] Password [email or web address]

## 2017-04-27 NOTE — ED Notes (Signed)
PT gone to [facility]

## 2017-04-27 NOTE — ED Notes (Signed)
Pt demanding staff to stay in room with him. Call bell placed in reach and door left open.

## 2017-04-27 NOTE — Consult Note (Addendum)
Renal Service Consult Note [facility]  Frank [name] 04/27/2017 Frank [name] Requesting Physician:  Dr Jeanell Sparrow  Reason for Consult:  ESRD pt with hyperkalemia HPI: The patient is a 51 y.o. year-old with hx of CHF, DM2, DVT, depression, HTN came to ED w/ last HD Thursday, missed Sat HD and feels SOB.  In ED K was > 7.5.  EKG shows sinus bradycardia 51 bpm, wide QRS 112 msec, no peaked T's. Asked to see for dialysis.   Patient is groggy but fully oriented.  Missed Sat HD.  Had a few "bites of salad".  +SOB, no CP, no abd pain , no /v/d.    ROS  denies CP  no joint pain   no HA  no blurry vision  no rash  no diarrhea  no nausea/ vomiting    Past Medical History  Past Medical History:  Diagnosis Date  . Anemia   . Anxiety   . Chronic combined systolic and diastolic CHF (congestive heart failure) (HCC)    a. EF 20-25% by echo in 08/2015 b. echo 10/2015: EF 35-40%, diffuse HK, severe LAE, moderate RAE, small pericardial effusion  . Complication of anesthesia    itching, sore throat  . Depression   . Dialysis patient ([place])   . DVT (deep venous thrombosis) ([place]) 02/2017  . ESRD (end stage renal disease) ([place])    due to HTN per patient, followed at [facility], s/p failed kidney transplant - dialysis Tue, Th, Sat  . Hyperkalemia 12/2015  . Hypertension   . Junctional rhythm    a. noted in 08/2015: hyperkalemic at that time  b. 12/2015: presented in junctional rhythm w/ K+ of 6.6. Resolved with improvement of K+ levels.  . Nonischemic cardiomyopathy ([place])    a. 08/2014: cath showing minimal CAD, but tortuous arteries noted.   . Personal history of DVT (deep vein thrombosis)/ PE 05/26/2016   In Oct 2015 had small subsemental LUL PE w/o DVT (LE dopplers neg) and was felt to be HD cath related, treated w coumadin.  IN May 2016 had small vein DVT (acute/subacute) in the R basilic/ brachial veins of the RUE, resumed on coumadin.  Had R sided HD cath at that time.    . Renal  insufficiency   . Shortness of breath   . Type II diabetes mellitus (HCC)    No history per patient, but remains under history as A1c would not be accurate given on dialysis   Past Surgical History  Past Surgical History:  Procedure Laterality Date  . CAPD INSERTION    . CAPD REMOVAL    . INGUINAL HERNIA REPAIR Right 02/14/2015   Procedure: REPAIR INCARCERATED RIGHT INGUINAL HERNIA;  Surgeon: [name], MD;  Location: [place];  Service: General;  Laterality: Right;  . INSERTION OF DIALYSIS CATHETER Right 09/23/2015   Procedure: exchange of Right internal Dialysis Catheter.;  Surgeon: [name], MD;  Location: [place];  Service: Vascular;  Laterality: Right;  . IR GENERIC HISTORICAL  07/16/2016   IR US GUIDE VASC ACCESS LEFT 07/16/2016 Corrie Mckusick, DO [facility]-INTER[facility]  . IR GENERIC HISTORICAL Left 07/16/2016   IR THROMBECTOMY AV FISTULA W/THROMBOLYSIS/PTA INC/SHUNT/IMG LEFT 07/16/2016 Corrie Mckusick, DO [facility]  . KIDNEY RECEIPIENT  2006   failed and started HD in March 2014  . LEFT HEART CATHETERIZATION WITH CORONARY ANGIOGRAM N/A 09/02/2014   Procedure: LEFT HEART CATHETERIZATION WITH CORONARY ANGIOGRAM;  Surgeon: [name], MD;  Location: [facility] CATH LAB;  Service:  Cardiovascular;  Laterality: N/A;   Family History  Family History  Problem Relation Age of Onset  . Hypertension Other    Social History  reports that he has quit smoking. His smoking use included Cigarettes. He smoked 0.00 packs per day for 1.00 year. He has never used smokeless tobacco. He reports that he uses drugs, including Marijuana. He reports that he does not drink alcohol. Allergies  Allergies  Allergen Reactions  . Butalbital-Apap-Caffeine Shortness Of Breath, Swelling and Other (See Comments)    Swelling in throat  . Ferrlecit [Na Ferric Gluc Cplx In Sucrose] Shortness Of Breath, Swelling and Other (See Comments)    Swelling in throat, tolerates Venofor  . Minoxidil Shortness Of Breath  . Tylenol  [Acetaminophen] Anaphylaxis and Swelling  . Darvocet [Propoxyphene N-Acetaminophen] Hives   Home medications Prior to Admission medications   Medication Sig Start Date End Date Taking? Authorizing Provider  amLODipine (NORVASC) 10 MG tablet Take 1 tablet (10 mg total) by mouth at bedtime. 12/01/16   Rice, Resa Miner, MD  cloNIDine (CATAPRES - DOSED IN MG/24 HR) 0.3 mg/24hr patch Place 0.3 mg onto the skin once a week. On Wednesday  12/31/16   [provider]  hydrALAZINE (APRESOLINE) 100 MG tablet Take 1 tablet (100 mg total) by mouth 2 (two) times daily. 01/07/17   Ledell Noss, MD  isosorbide mononitrate (IMDUR) 60 MG 24 hr tablet Take 1 tablet (60 mg total) by mouth daily. 01/08/17   Ledell Noss, MD  metoCLOPramide (REGLAN) 5 MG tablet Take 1 tablet (5 mg total) by mouth every 8 (eight) hours as needed for nausea. 04/22/17   [name], DO  ondansetron (ZOFRAN ODT) 8 MG disintegrating tablet 67m ODT q4 hours prn nausea 04/22/17   VEulogio BearU, DO  pantoprazole (PROTONIX) 40 MG tablet Take 1 tablet (40 mg total) by mouth 2 (two) times daily before a meal. 12/01/16   Rice, CResa Miner MD  sevelamer carbonate (RENVELA) 800 MG tablet Take 3 tablets (2,400 mg total) by mouth 3 (three) times daily with meals. 04/22/17   VGeradine Girt DO  traMADol (ULTRAM) 50 MG tablet Take 1 tablet (50 mg total) by mouth every 12 (twelve) hours as needed for moderate pain or severe pain. 12/01/16   RCollier Salina MD  warfarin (COUMADIN) 5 MG tablet Take 1.5 tablets (7.5 mg total) by mouth daily. 04/22/17   VGeradine Girt DO   Liver Function Tests  Recent Labs Lab 04/22/17 0112 04/27/17 1840  AST 15 24  ALT 8* 15*  ALKPHOS 102 127*  BILITOT 1.0 1.2  PROT 7.1 8.0  ALBUMIN 3.1* 3.5    Recent Labs Lab 04/27/17 1840  LIPASE 27   CBC  Recent Labs Lab 04/21/17 2127 04/22/17 0112 04/27/17 1840  WBC 5.2 3.7* 5.6  HGB 8.7* 8.7* 9.7*  HCT 26.7* 26.1* 30.2*  MCV 84.8 85.3 86.0   PLT 147* 138* 1948  Basic Metabolic Panel  Recent Labs Lab 04/21/17 0042 04/22/17 0112 04/27/17 1840  NA 140 137 134*  K 4.4 4.0 >7.5*  CL 100* 98* 93*  CO2 21* 28 19*  GLUCOSE 46* 84 61*  BUN 78* 43* 92*  CREATININE 14.82* 9.80* 15.38*  CALCIUM 8.0* 8.1* 8.5*   Iron/TIBC/Ferritin/ %Sat    Component Value Date/Time   IRON 41 (L) 05/29/2016 1350   TIBC 190 (L) 05/29/2016 1350   FERRITIN 328 11/02/2015 0346   IRONPCTSAT 22 05/29/2016 1350    Vitals:  04/27/17 1728 04/27/17 1729 04/27/17 1845  BP: (!) 158/111  (!) 149/63  Pulse: (!) 52  (!) 33  Resp: 18  14  Temp: 97.8 F (36.6 C)    TempSrc: Oral    SpO2: 100%  99%  Weight:  73.5 kg (162 lb)   Height:  _0  (1.88 m)    Exam Gen lethargic, responding, sitting up on side of stretcher No rash, cyanosis or gangrene Sclera anicteric, throat clear  +JVD  Chest some bibasilar crackles, mild RRR no MRG tachy Abd soft ntnd no mass or ascites +bs GU defer MS no joint effusions or deformity Ext 1+ LE edema / no wounds or ulcers Neuro is alert, Ox 3 , groggy LUA AVF + bruit  Home meds: -norvasc/ catapres patch/ hydralazine -warfarin 7.5 qd/ tramadol/ renvela/ PPI/ zofran prn/ Reglan prn/ Imdur  Dialysis: [facility] MWF 4h   71kg   2/2.25 bath  Hep 2000  LUA AVF  CXR - CM, no edema or infiltrate  Impression: 1. Severe hyperkalemia - missed HD. Sinus bradycardia 2. Sinus brady - HR 51, BP's ok 3. HTN - on 3 BP meds 4. Volume - not grossly overloaded, BP's up, CXR clear 5. DVT - supposed to be on coumadin, INR 1.38 6. Anemia - Hb 9.7 7. Noncompliance w HD    Plan - emergent HD tonight, hyperK+ protocol to be done in ED prior to HD  Kelly Splinter MD [place] pager [phone]   04/27/2017, 7:56 PM

## 2017-04-27 NOTE — Progress Notes (Signed)
Patient arrived to unit per ED stretcher.  Reviewed treatment plan and this RN agrees.  Report received from bedside RN, Minna Merritts.  Consent obtained.  Patient A & O X 4. Lung sounds diminished and clear to ausculation in all fields. No edema. Cardiac: NSR.  Prepped LLAVF with alcohol and cannulated with two 15 gauge needles.  Pulsation of blood noted.  Flushed access well with saline per protocol.  Connected and secured lines and initiated tx at 2125.  UF goal of 3000 mL and net fluid removal of 2500 mL.  Will continue to monitor.

## 2017-04-27 NOTE — Procedures (Signed)
   I was present at this dialysis session, have reviewed the session itself and made  appropriate changes Kelly Splinter MD [place] pager [phone]   04/27/2017, 9:53 PM

## 2017-04-27 NOTE — ED Notes (Signed)
Pt assisted to sitting position at end of bed by Dr. Jeanell Sparrow. Door left open, call bell within reach.

## 2017-04-27 NOTE — Progress Notes (Signed)
ANTICOAGULATION CONSULT NOTE - Initial Consult  Pharmacy Consult for Coumadin Indication: h/o recurrent VTE w/ known noncompliance  Allergies  Allergen Reactions  . Butalbital-Apap-Caffeine Shortness Of Breath, Swelling and Other (See Comments)    Swelling in throat  . Ferrlecit [Na Ferric Gluc Cplx In Sucrose] Shortness Of Breath, Swelling and Other (See Comments)    Swelling in throat, tolerates Venofor  . Minoxidil Shortness Of Breath  . Tylenol [Acetaminophen] Anaphylaxis and Swelling  . Darvocet [Propoxyphene N-Acetaminophen] Hives    Patient Measurements: Height: _0  (188 cm) Weight: 162 lb 0.6 oz (73.5 kg) IBW/kg (Calculated) : 82.2  Vital Signs: Temp: 98 F (36.7 C) (10/07 2115) Temp Source: Oral (10/07 1728) BP: 149/97 (10/07 2230) Pulse Rate: 80 (10/07 2230)  Labs:  Recent Labs  04/27/17 1840  HGB 9.7*  HCT 30.2*  PLT 182  LABPROT 16.9*  INR 1.38  CREATININE 15.38*    Estimated Creatinine Clearance: 5.8 mL/min (A) (by C-G formula based on SCr of 15.38 mg/dL (H)).   Medical History: Past Medical History:  Diagnosis Date  . Anemia   . Anxiety   . Chronic combined systolic and diastolic CHF (congestive heart failure) (HCC)    a. EF 20-25% by echo in 08/2015 b. echo 10/2015: EF 35-40%, diffuse HK, severe LAE, moderate RAE, small pericardial effusion  . Complication of anesthesia    itching, sore throat  . Depression   . Dialysis patient ([place])   . DVT (deep venous thrombosis) ([place]) 02/2017  . ESRD (end stage renal disease) ([place])    due to HTN per patient, followed at [facility], s/p failed kidney transplant - dialysis Tue, Th, Sat  . Hyperkalemia 12/2015  . Hypertension   . Junctional rhythm    a. noted in 08/2015: hyperkalemic at that time  b. 12/2015: presented in junctional rhythm w/ K+ of 6.6. Resolved with improvement of K+ levels.  . Nonischemic cardiomyopathy ([place])    a. 08/2014: cath showing minimal CAD, but tortuous arteries noted.   .  Personal history of DVT (deep vein thrombosis)/ PE 05/26/2016   In Oct 2015 had small subsemental LUL PE w/o DVT (LE dopplers neg) and was felt to be HD cath related, treated w coumadin.  IN May 2016 had small vein DVT (acute/subacute) in the R basilic/ brachial veins of the RUE, resumed on coumadin.  Had R sided HD cath at that time.    . Renal insufficiency   . Shortness of breath   . Type II diabetes mellitus (HCC)    No history per patient, but remains under history as A1c would not be accurate given on dialysis     Assessment: 51yo male presents after missing HD, found to be hyperkalemic and therefore admitted, to continue Coumadin for h/o recurrent VTE; current INR below goal w/ last dose taken 10/6 though pt known to be noncompliant.   Goal of Therapy:  INR 2-3   Plan:  Will give boosted Coumadin dose of 12.28m x1 now and monitor INR for dose adjustments.  VWynona Neat PharmD, BCPS  04/27/2017,10:55 PM

## 2017-04-27 NOTE — ED Notes (Signed)
Pt demanding staff to "hurry up and do something fast or Imma pass out on you". CBG 51.

## 2017-04-28 DIAGNOSIS — I82409 Acute embolism and thrombosis of unspecified deep veins of unspecified lower extremity: Secondary | ICD-10-CM

## 2017-04-28 LAB — RENAL FUNCTION PANEL
Albumin: 2.9 g/dL — ABNORMAL LOW (ref 3.5–5.0)
Anion gap: 11 (ref 5–15)
BUN: 33 mg/dL — AB (ref 6–20)
CHLORIDE: 96 mmol/L — AB (ref 101–111)
CO2: 29 mmol/L (ref 22–32)
CREATININE: 8.56 mg/dL — AB (ref 0.61–1.24)
Calcium: 8.3 mg/dL — ABNORMAL LOW (ref 8.9–10.3)
GFR calc Af Amer: 7 mL/min — ABNORMAL LOW (ref >60)
GFR, EST NON AFRICAN AMERICAN: 6 mL/min — AB (ref >60)
GLUCOSE: 116 mg/dL — AB (ref 65–99)
POTASSIUM: 4.4 mmol/L (ref 3.5–5.1)
Phosphorus: 4.8 mg/dL — ABNORMAL HIGH (ref 2.5–4.6)
Sodium: 136 mmol/L (ref 135–145)

## 2017-04-28 LAB — GLUCOSE, CAPILLARY: GLUCOSE-CAPILLARY: 131 mg/dL — AB (ref 65–99)

## 2017-04-28 MED ORDER — OXYCODONE HCL 5 MG PO TABS
10.0000 mg | ORAL_TABLET | Freq: Once | ORAL | Status: AC
  Administered 2017-04-28: 10 mg via ORAL

## 2017-04-28 MED ORDER — OXYCODONE HCL 5 MG PO TABS
ORAL_TABLET | ORAL | Status: AC
  Filled 2017-04-28: qty 2

## 2017-04-28 NOTE — Progress Notes (Addendum)
Pt refuses tele box and has taken it off.   1339 pt is stating he is leaving "right now" and is not "waiting any longer". Have notified MD Rizwan   MD Rizwan talked to pt.   9935 Pt refused to sign AMA paperwork. This RN notified charge RN Ulice Dash, and she talked to him. Pt still refused to sign AMA paperwork and refused to stay in the hospital.  Pt has left the hospital without being discharged  By MD or signing [place] paperwork.

## 2017-04-28 NOTE — Progress Notes (Signed)
Inpatient Diabetes Program Recommendations  AACE/ADA: New Consensus Statement on Inpatient Glycemic Control (2015)  Target Ranges:  Prepandial:   less than 140 mg/dL      Peak postprandial:   less than 180 mg/dL (1-2 hours)      Critically ill patients:  140 - 180 mg/dL   Results for Frank [name], Frank [name] (MRN [id number]) as of 04/28/2017 09:05  Ref. Range 04/27/2017 18:10 04/27/2017 20:16  Glucose-Capillary Latest Ref Range: 65 - 99 mg/dL 51 (L) 462 (H)  Results for Frank [name], Frank [name] (MRN [id number]) as of 04/28/2017 09:05  Ref. Range 04/21/2017 01:40  Hemoglobin A1C Latest Ref Range: 4.8 - 5.6 % 5.2   Review of Glycemic Control  Diabetes history: DM2 Outpatient Diabetes medications: None Current orders for Inpatient glycemic control: None  Inpatient Diabetes Program Recommendations: Correction (SSI): While inpatient, please consider ordering CBGs with Novolog 0-9 units TID with meals and Novolog 0-5 units QHS.  NOTE: Per chart review, patient has a history of DM2 but does not take any DM medications as an outpatient. Noted glucose of 51 mg/dl at 18:10 on 04/27/17 and then CBG of 462 mg/dl on 04/28/16 at 20:16 and no follow up CBGs since then. Question if glucose of 462 mg/dl correct. Recommend ordering CBGS with Novolog correction scale ACHS while inpatient. Will continue to follow.  Thanks, Barnie Alderman, RN, MSN, CDE Diabetes Coordinator Inpatient Diabetes Program [phone] (Team Pager from 8am to 5pm)

## 2017-04-28 NOTE — Progress Notes (Signed)
Pt admitted to the unit. Pt is stable, alert and oriented per baseline. Oriented to room, staff, and call bell. Educated to call for any assistance. Bed in lowest position, call bell within reach- will continue to monitor.

## 2017-04-28 NOTE — Discharge Summary (Signed)
Physician Discharge Summary  Frank [name] DHW:[id number] DOB: 1966/01/08 DOA: 04/27/2017  PCP: Lolita Patella, MD  Admit date: 04/27/2017 Discharge date: 05/09/2017  Admitted From: home  Disposition:  Home  LEFT AMA     Discharge Diagnoses:  Principal Problem:   Hyperkalemia Active Problems:   DM (diabetes mellitus), type 2, uncontrolled, with renal complications ([place])   ESRD on hemodialysis ([place])   Essential hypertension   GERD (gastroesophageal reflux disease)   Recurrent deep venous thrombosis (HCC)    Subjective: No complaints- refuses Coumadin/ Heparin bridge for DVT  Brief Summary: Frank [name] is a 51 y.o. male  with past mental history of CHF, DM2, HTN, anemia, anxiety, depression end-stage renal disease presents with nausea. Patient states he's had some nausea vomiting. Some abdominal comfort as after he missed dialysis on Saturday due to lack of transportation. Has a history of noncompliance.    Hospital Course:  Hyperkalemia/ ESRD - missed dialysis due to lack of transport - dialysis done yesterday - per nephro, no need for further dialysis until tomorrow- ok to d/c home  HTN Hydralazine, Imdur, Norvasc  Right arm DVT - right arm axillary vein acute on chronic DVT- diagnosed in 8/21 - INR subtherapeutic- refuses to stay in the hospital for bridging   Discharge Instructions   Allergies as of 04/28/2017      Reactions   Butalbital-apap-caffeine Shortness Of Breath, Swelling, Other (See Comments)   Swelling in throat   Ferrlecit [na Ferric Gluc Cplx In Sucrose] Shortness Of Breath, Swelling, Other (See Comments)   Swelling in throat, tolerates Venofor   Minoxidil Shortness Of Breath   Tylenol [acetaminophen] Anaphylaxis, Swelling   Darvocet [propoxyphene N-acetaminophen] Hives      Medication List    ASK your doctor about these medications   amLODipine 10 MG tablet Commonly known as:  NORVASC Take 1 tablet (10 mg total) by mouth at bedtime.    cloNIDine 0.3 mg/24hr patch Commonly known as:  CATAPRES - Dosed in mg/24 hr Place 0.3 mg onto the skin once a week. On Wednesday   hydrALAZINE 100 MG tablet Commonly known as:  APRESOLINE Take 1 tablet (100 mg total) by mouth 2 (two) times daily.   isosorbide mononitrate 60 MG 24 hr tablet Commonly known as:  IMDUR Take 1 tablet (60 mg total) by mouth daily.   ondansetron 8 MG disintegrating tablet Commonly known as:  ZOFRAN ODT 61m ODT q4 hours prn nausea   pantoprazole 40 MG tablet Commonly known as:  PROTONIX Take 1 tablet (40 mg total) by mouth 2 (two) times daily before a meal.   sevelamer carbonate 800 MG tablet Commonly known as:  RENVELA Take 3 tablets (2,400 mg total) by mouth 3 (three) times daily with meals.   traMADol 50 MG tablet Commonly known as:  ULTRAM Take 1 tablet (50 mg total) by mouth every 12 (twelve) hours as needed for moderate pain or severe pain.   warfarin 5 MG tablet Commonly known as:  COUMADIN Take 1.5 tablets (7.5 mg total) by mouth daily.       Allergies  Allergen Reactions  . Butalbital-Apap-Caffeine Shortness Of Breath, Swelling and Other (See Comments)    Swelling in throat  . Ferrlecit [Na Ferric Gluc Cplx In Sucrose] Shortness Of Breath, Swelling and Other (See Comments)    Swelling in throat, tolerates Venofor  . Minoxidil Shortness Of Breath  . Tylenol [Acetaminophen] Anaphylaxis and Swelling  . Darvocet [Propoxyphene N-Acetaminophen] Hives     Procedures/Studies:  Ct Abdomen Pelvis Wo Contrast  Result Date: 05/01/2017 CLINICAL DATA:  Acute abdominal pain, back pain, pancreatitis, dialysis dependent EXAM: CT ABDOMEN AND PELVIS WITHOUT CONTRAST TECHNIQUE: Multidetector CT imaging of the abdomen and pelvis was performed following the standard protocol without IV contrast. COMPARISON:  04/20/2017 FINDINGS: Lower chest: Marked cardiomegaly without significant pericardial effusion. Similar bibasilar atelectasis. Trace right  pleural effusion. Atherosclerosis of the lower thoracic aorta. Hepatobiliary: No definite focal hepatic abnormality or intrahepatic biliary dilatation within the limits of noncontrast imaging. Gallbladder and biliary system unremarkable. Trace perihepatic fluid along the right inferior liver margin. Pancreas: Small amount of peri pancreatic free fluid along the pancreas body and tail compatible with pancreatitis. No ductal dilatation. Limited assessment without contrast. Spleen: Normal in size without focal abnormality. Adrenals/Urinary Tract: Normal adrenal glands for age. Chronic renal atrophy bilaterally of the native kidneys. Stable indeterminate 4.4 cm left renal mass as noted before. No renal obstruction or hydronephrosis. Chronic changes from a left pelvic renal transplant. Bladder is collapsed. Stomach/Bowel: Limited without oral contrast. Negative for bowel obstruction, significant dilatation, ileus, free air. Limited with motion artifact as well. No new fluid collection, definite abscess or hemorrhage. Vascular/Lymphatic: Extensive abdominopelvic atherosclerosis as before. No significant aneurysm. Stable prominent retroperitoneal nodes. Reproductive: Seminal vesicles are not enlarged. Prostate gland normal in size. Vascular calcifications noted. Other: No inguinal or abdominal wall hernia. Musculoskeletal: No acute osseous finding. IMPRESSION: Small amount of peri pancreatic free fluid about the pancreas body and tail compatible with mild pancreatitis. No formed fluid collection, large pseudo cyst, or abscess. Trace perihepatic fluid also noted. Limited assessment without IV contrast. Stable cardiomegaly without CHF Chronic renal atrophy and a remote left pelvic transplant kidney. Chronic indeterminate 4.4 cm left renal mass. Extensive abdominal and pelvic atherosclerosis Electronically Signed   By: Jerilynn Mages.  Shick M.D.   On: 05/01/2017 11:19   Ct Abdomen Pelvis Wo Contrast  Result Date: 04/21/2017 CLINICAL  DATA:  Abdominal pain and vomiting. History of diabetes, renal insufficiency, anemia, dialysis. EXAM: CT ABDOMEN AND PELVIS WITHOUT CONTRAST TECHNIQUE: Multidetector CT imaging of the abdomen and pelvis was performed following the standard protocol without IV contrast. COMPARISON:  01/05/2017 FINDINGS: Lower chest: Atelectasis in the lung bases. Diffuse cardiac enlargement. Mild retrocrural lymphadenopathy. Hepatobiliary: Gallbladder is somewhat contracted and the wall appears mildly thickened. Can't exclude inflammatory process. No radiopaque stones are identified. No bile duct dilatation. No focal liver lesions. Pancreas: Unremarkable. No pancreatic ductal dilatation or surrounding inflammatory changes. Spleen: Normal in size without focal abnormality. Adrenals/Urinary Tract: No adrenal gland nodules. The kidneys are atrophic bilaterally. No hydronephrosis. Small cysts are demonstrated bilaterally. There is a 4.4 cm mass arising from the midportion of the right kidney. This lesion was present previously and is unchanged in size. The lesion has a density measurement of 40 Hounsfield units. This suggests either a hemorrhagic cyst or a solid lesion. Renal cell carcinoma is not excluded. The lesion demonstrated significant increase in size between studies dated 03/11/2015 and 10/30/2015 but has remained stable in size since then. Bladder is decompressed. There is a left pelvic transplant kidney which appears to be decreased in attenuation, possibly hydronephrotic. There is extensive calcification in the hilar vessels. Stomach/Bowel: Stomach, small bowel, and colon are not abnormally distended. No wall thickening is demonstrated although lack of contrast material and under distention limit evaluation. The appendix is normal. Vascular/Lymphatic: Extensive vascular calcifications involving aorto iliac vessels. Retroperitoneal lymphadenopathy is demonstrated with periaortic nodes measuring up to 14 mm diameter. These are  nonspecific  in could be metastatic, reactive, or lymphoproliferative thin origin. Similar appearance to previous studies. Reproductive: Prostate is unremarkable. Other: No free air or free fluid in the abdomen. Motion artifact limits the examination. Musculoskeletal: Diffusely increased mineralization throughout the visualized spine consistent with renal osteodystrophy. No destructive bone lesions. IMPRESSION: 1. No definite evidence of bowel obstruction or inflammation although motion artifact and lack of distention and contrast limit examination. 2. Bilateral renal atrophy and renal cysts. Indeterminate 4.4 cm mass from the midportion of the right kidney could represent a solid lesion. No change in size dating back to 10/30/2015. 3. Left pelvic transplant kidney may be hydronephrotic and demonstrate significant vascular calcification. 4. Extensive aortic atherosclerosis. 5. Bone changes consistent with renal osteodystrophy. 6. Diffuse cardiac enlargement. 7. Mild retrocrural and retroperitoneal lymphadenopathy is indeterminate and could represent reactive change, metastatic disease, or lymphoproliferative disorder. No change since previous studies. Electronically Signed   By: Lucienne Capers M.D.   On: 04/21/2017 00:35   Dg Chest 1 View  Result Date: 04/21/2017 CLINICAL DATA:  Nausea, vomiting, and mid abdominal pain today. EXAM: CHEST 1 VIEW COMPARISON:  None 918 FINDINGS: Cardiac enlargement with mild central pulmonary vascular congestion. No edema or consolidation. No blunting of costophrenic angles. No pneumothorax. Calcification of the aorta. IMPRESSION: Cardiac enlargement with mild central pulmonary vascular congestion. No edema or consolidation. Electronically Signed   By: Lucienne Capers M.D.   On: 04/21/2017 01:01   Dg Abd 1 View  Result Date: 05/04/2017 CLINICAL DATA:  51 y/o  M; upper and mid abdominal pain. EXAM: ABDOMEN - 1 VIEW COMPARISON:  05/01/2017 CT abdomen and pelvis. FINDINGS: The  bowel gas pattern is normal. No radio-opaque calculi or other significant radiographic abnormality are seen. Vascular calcifications in left hemipelvis related to since renal transplant. IMPRESSION: Normal bowel gas pattern. Electronically Signed   By: Kristine Garbe M.D.   On: 05/04/2017 17:26   Dg Chest Portable 1 View  Result Date: 04/27/2017 CLINICAL DATA:  Dialysis patient, reporting elevated potassium levels and dyspnea after missing a treatment yesterday. EXAM: PORTABLE CHEST 1 VIEW COMPARISON:  04/21/2017 FINDINGS: Unchanged moderate enlargement of the cardiac silhouette. The lungs are clear. Normal pulmonary vasculature. Unremarkable mediastinal and hilar contours. No pleural effusions are evident. IMPRESSION: Stable cardiomegaly.  No consolidation or large effusion. Electronically Signed   By: Andreas Newport M.D.   On: 04/27/2017 19:05   US Abdomen Limited Ruq  Result Date: 05/05/2017 CLINICAL DATA:  Inpatient with reported history of acute pancreatitis. EXAM: ULTRASOUND ABDOMEN LIMITED RIGHT UPPER QUADRANT COMPARISON:  05/01/2017 unenhanced CT abdomen/pelvis. FINDINGS: Gallbladder: No gallstones. No gallbladder wall thickening. No significant gallbladder distention. No sonographic Murphy's sign. Small amount of pericholecystic fluid. Common bile duct: Diameter: 6 mm, top-normal Liver: No focal lesion identified. Within normal limits in parenchymal echogenicity. Portal vein is patent on color Doppler imaging with normal direction of blood flow towards the liver. Right pleural effusion noted. IMPRESSION: 1. No cholelithiasis. No sonographic findings of acute cholecystitis. 2. Nonspecific small volume pericholecystic fluid, probably secondary to pancreatitis. 3. Top-normal caliber common bile duct (6 mm diameter). 4. Normal liver. 5. Right pleural effusion. Electronically Signed   By: Ilona Sorrel M.D.   On: 05/05/2017 10:45       Discharge Exam: Vitals:   04/28/17 0234 04/28/17  0946  BP: (!) 173/112 (!) 150/88  Pulse: 76 88  Resp: 16 16  Temp: 98.6 F (37 C) 98.5 F (36.9 C)  SpO2: 96% 98%   Vitals:  04/28/17 0125 04/28/17 0127 04/28/17 0234 04/28/17 0946  BP: (!) 192/118 (!) 184/119 (!) 173/112 (!) 150/88  Pulse: 71 76 76 88  Resp: _0 Temp: 98.1 F (36.7 C)  98.6 F (37 C) 98.5 F (36.9 C)  TempSrc:   Oral Oral  SpO2:   96% 98%  Weight: 71.5 kg (157 lb 10.1 oz)     Height:        General: Pt is alert, awake, not in acute distress Cardiovascular: RRR, S1/S2 +, no rubs, no gallops Respiratory: CTA bilaterally, no wheezing, no rhonchi Abdominal: Soft, NT, ND, bowel sounds + Extremities: no edema, no cyanosis    The results of significant diagnostics from this hospitalization (including imaging, microbiology, ancillary and laboratory) are listed below for reference.     Microbiology: No results found for this or any previous visit (from the past 240 hour(s)).   Labs: BNP (last 3 results) No results for input(s): BNP in the last 8760 hours. Basic Metabolic Panel:  Recent Labs Lab 05/03/17 2203 05/05/17 1358 05/05/17 2015 05/06/17 0715  NA 137 136 137 135  K 4.5 5.5* 3.8 4.7  CL 97* 96* 96* 95*  CO2 _1 GLUCOSE 133* 105* 150* 114*  BUN 56* 84* 35* 43*  CREATININE 10.35* 13.32* 7.52* 9.17*  CALCIUM 8.3* 8.1* 8.1* 7.5*  PHOS  --  5.9* 4.1 5.0*   Liver Function Tests:  Recent Labs Lab 05/03/17 2203 05/05/17 1358 05/05/17 2015 05/06/17 0715  AST 23  --   --   --   ALT 15*  --   --   --   ALKPHOS 168*  --   --   --   BILITOT 0.8  --   --   --   PROT 7.4  --   --   --   ALBUMIN 3.2* 3.0* 2.9* 2.7*    Recent Labs Lab 05/03/17 2203  LIPASE 220*   No results for input(s): AMMONIA in the last 168 hours. CBC:  Recent Labs Lab 05/03/17 2203 05/05/17 1358 05/05/17 2015 05/06/17 0715  WBC 5.4 4.7 4.1 4.2  HGB 9.0* 9.0* 8.9* 8.7*  HCT 28.5* 28.1* 27.9* 27.4*  MCV 90.2 88.4 88.3 89.3  PLT 191 211 175  175   Cardiac Enzymes: No results for input(s): CKTOTAL, CKMB, CKMBINDEX, TROPONINI in the last 168 hours. BNP: Invalid input(s): POCBNP CBG: No results for input(s): GLUCAP in the last 168 hours. D-Dimer No results for input(s): DDIMER in the last 72 hours. Hgb A1c No results for input(s): HGBA1C in the last 72 hours. Lipid Profile No results for input(s): CHOL, HDL, LDLCALC, TRIG, CHOLHDL, LDLDIRECT in the last 72 hours. Thyroid function studies No results for input(s): TSH, T4TOTAL, T3FREE, THYROIDAB in the last 72 hours.  Invalid input(s): FREET3 Anemia work up No results for input(s): VITAMINB12, FOLATE, FERRITIN, TIBC, IRON, RETICCTPCT in the last 72 hours. Urinalysis    Component Value Date/Time   COLORURINE YELLOW 10/18/2013 0419   APPEARANCEUR CLEAR 10/18/2013 0419   LABSPEC 1.008 10/18/2013 0419   PHURINE 8.5 (H) 10/18/2013 0419   GLUCOSEU 100 (A) 10/18/2013 0419   HGBUR TRACE (A) 10/18/2013 0419   BILIRUBINUR NEGATIVE 10/18/2013 0419   KETONESUR NEGATIVE 10/18/2013 0419   PROTEINUR 100 (A) 10/18/2013 0419   UROBILINOGEN 0.2 10/18/2013 0419   NITRITE NEGATIVE 10/18/2013 0419   LEUKOCYTESUR NEGATIVE 10/18/2013 0419   Sepsis Labs Invalid input(s): PROCALCITONIN,  WBC,  LACTICIDVEN Microbiology No results found  for this or any previous visit (from the past 240 hour(s)).   Time coordinating discharge: Over 30 minutes  SIGNED:   Debbe Odea, MD  [facility] 05/09/2017, 4:25 PM Pager   If 7PM-7AM, please contact night-coverage [email or web address] Password [email or web address]

## 2017-04-28 NOTE — Progress Notes (Signed)
Pt still does not want his vitals taken and he refused his morning labs.

## 2017-04-28 NOTE — Progress Notes (Signed)
Dialysis treatment completed.  2500 mL ultrafiltrated and net fluid removal 2000 mL.    Patient status unchanged. Lung sounds diminished and clear to ausculation in all fields. No edema. Cardiac: NSR.  Disconnected lines and removed needles.  Pressure held for 10 minutes and band aid/gauze dressing applied.  Report given to bedside RN, Sydell Axon.

## 2017-04-28 NOTE — Progress Notes (Signed)
RN met pt and took vitals. He stated that he did not want anyone in the room after vitals. RN convinced patient to take b/p meds but then patient stated that he wanted something to eat and nothing else. RN explained to patient that staff will have to recheck his b/p within an hour, pt stated "tell your staff and whoever else that I refuse." Pt was educated but he asked staff to leave the room and not bother him. RN also tried to ask some admission questions but pt said "if yall don't know the answer, then oh well." He refuses some of his medication at this time. Np on call has been paged and updated about pt status. Will continue to monitor

## 2017-04-28 NOTE — Progress Notes (Signed)
Pt refused MRSA swab and has strong history of MRSA infection.   Infection prevention called to advise to initiate contact precautions.

## 2017-04-28 NOTE — Progress Notes (Signed)
[facility] Progress Note   Subjective: Seen in room. Denies CP, dyspnea, or weakness. + cramping last night after dialysis. Refused meds, vitals, and labs this morning. Discussed that he has to do this in order to determine plan for today, agreed afterwards.   Objective Vitals:   04/28/17 0100 04/28/17 0125 04/28/17 0127 04/28/17 0234  BP: (!) 179/122 (!) 192/118 (!) 184/119 (!) 173/112  Pulse: 71 71 76 76  Resp:  16  16  Temp:  98.1 F (36.7 C)  98.6 F (37 C)  TempSrc:    Oral  SpO2:    96%  Weight:  71.5 kg (157 lb 10.1 oz)    Height:       Physical Exam General: Well appearing, NAD. Heart: RRR; no murmur. Lungs: CTAB Abdomen: soft, non-tender Extremities: No LE edema Dialysis Access: L AVF + thrill  Additional Objective Labs: Basic Metabolic Panel:  Recent Labs Lab 04/22/17 0112 04/27/17 1840  NA 137 134*  K 4.0 >7.5*  CL 98* 93*  CO2 28 19*  GLUCOSE 84 61*  BUN 43* 92*  CREATININE 9.80* 15.38*  CALCIUM 8.1* 8.5*   Liver Function Tests:  Recent Labs Lab 04/22/17 0112 04/27/17 1840  AST 15 24  ALT 8* 15*  ALKPHOS 102 127*  BILITOT 1.0 1.2  PROT 7.1 8.0  ALBUMIN 3.1* 3.5    Recent Labs Lab 04/27/17 1840  LIPASE 27   CBC:  Recent Labs Lab 04/21/17 2127 04/22/17 0112 04/27/17 1840  WBC 5.2 3.7* 5.6  HGB 8.7* 8.7* 9.7*  HCT 26.7* 26.1* 30.2*  MCV 84.8 85.3 86.0  PLT 147* 138* 182   Cardiac Enzymes:  Recent Labs Lab 04/22/17 0112 04/22/17 0748  TROPONINI 0.05* 0.06*   CBG:  Recent Labs Lab 04/21/17 1850 04/22/17 0010 04/22/17 0809 04/27/17 1810 04/27/17 2016  GLUCAP 102* 82 125* 51* 462*   Studies/Results: Dg Chest Portable 1 View  Result Date: 04/27/2017 CLINICAL DATA:  Dialysis patient, reporting elevated potassium levels and dyspnea after missing a treatment yesterday. EXAM: PORTABLE CHEST 1 VIEW COMPARISON:  04/21/2017 FINDINGS: Unchanged moderate enlargement of the cardiac silhouette. The lungs are  clear. Normal pulmonary vasculature. Unremarkable mediastinal and hilar contours. No pleural effusions are evident. IMPRESSION: Stable cardiomegaly.  No consolidation or large effusion. Electronically Signed   By: Andreas Newport M.D.   On: 04/27/2017 19:05   Medications: . sodium chloride    . sodium chloride     . amLODipine  10 mg Oral QHS  . [START ON 04/30/2017] cloNIDine  0.3 mg Transdermal Q Wed  . heparin  2,000 Units Dialysis Once in dialysis  . heparin  5,000 Units Subcutaneous Q8H  . hydrALAZINE  100 mg Oral BID  . isosorbide mononitrate  60 mg Oral Daily  . oxyCODONE      . pantoprazole  40 mg Oral BID AC  . sevelamer carbonate  2,400 mg Oral TID WC  . Warfarin - Pharmacist Dosing Inpatient   Does not apply q1800    Dialysis Orders: TTS at [facility] 4hr, BFR 400, DFR 800, EDW 71kg, 2K/2.25Ca, AVF, Heparin 2000 bolus - Mircera 265mg IV q 2 weeks (last given 9/6, Aranesp 1534m given 10/1) - Calcitriol 3.757mPO q HD  Assessment/Plan: 1. Hyperkalemia (with EKG changes/bradycardia): S/p emergent HD 10/7. Awaiting labs to determine if needs another HD today (likely). 2.  ESRD: S/p HD 10/7 overnight. May need another HD today, pending labs. If K normal, can wait until usual TTS  schedule. 3.  Hypertension/volume: BP high, to take home meds. Close to EDW after last HD. 4.  Anemia: Hgb 9.7. Will re-dose Aranesp 186mg today. 5.  Metabolic bone disease: Ca ok. Continue Calcitriol, PO Sensipar 933m and Renvela 3/meals. 6.  Type 2 DM: On insulin, per primary. 7.  Hx RUE DVT (Dx 03/11/17, s/p admit for anticoagulation, left AMA): On warfarin, continues to have low INR (likely not taking). Per primary. 8. Non-compliance: Ongoing non-compliance with meds and HD. Car trouble is the recent issue. Unclear how we can help with that.  KaVeneta PentonPA-C 04/28/2017, 8:49 AM  [facility] Pager: [phone]

## 2017-04-28 NOTE — Plan of Care (Signed)
Problem: Nutrition: Goal: Adequate nutrition will be maintained Outcome: Not Progressing Pt does not adhere to fluid restrictions or renal diet. Have reinforced education on both topics.

## 2017-04-28 NOTE — ED Notes (Signed)
This RN at bedside with ultrasound machine to obtain vascular access and obtain lab work. Unable to get access, edp at bedside also attempting.

## 2017-04-29 DIAGNOSIS — Z23 Encounter for immunization: Secondary | ICD-10-CM | POA: Diagnosis not present

## 2017-04-29 DIAGNOSIS — N2581 Secondary hyperparathyroidism of renal origin: Secondary | ICD-10-CM | POA: Diagnosis not present

## 2017-04-29 DIAGNOSIS — I82621 Acute embolism and thrombosis of deep veins of right upper extremity: Secondary | ICD-10-CM | POA: Diagnosis not present

## 2017-04-29 DIAGNOSIS — N186 End stage renal disease: Secondary | ICD-10-CM | POA: Diagnosis not present

## 2017-04-29 DIAGNOSIS — D631 Anemia in chronic kidney disease: Secondary | ICD-10-CM | POA: Diagnosis not present

## 2017-05-01 ENCOUNTER — Emergency Department (HOSPITAL_COMMUNITY): Payer: Medicare Other

## 2017-05-01 ENCOUNTER — Encounter (HOSPITAL_COMMUNITY): Payer: Self-pay | Admitting: Emergency Medicine

## 2017-05-01 ENCOUNTER — Emergency Department (HOSPITAL_COMMUNITY)
Admission: EM | Admit: 2017-05-01 | Discharge: 2017-05-01 | Disposition: A | Discharge status: Home / Self Care | Payer: Medicare Other | Attending: Emergency Medicine | Admitting: Emergency Medicine

## 2017-05-01 DIAGNOSIS — I110 Hypertensive heart disease with heart failure: Secondary | ICD-10-CM | POA: Diagnosis not present

## 2017-05-01 DIAGNOSIS — Z7901 Long term (current) use of anticoagulants: Secondary | ICD-10-CM | POA: Diagnosis not present

## 2017-05-01 DIAGNOSIS — K858 Other acute pancreatitis without necrosis or infection: Secondary | ICD-10-CM | POA: Diagnosis not present

## 2017-05-01 DIAGNOSIS — K859 Acute pancreatitis without necrosis or infection, unspecified: Secondary | ICD-10-CM | POA: Diagnosis not present

## 2017-05-01 DIAGNOSIS — R9431 Abnormal electrocardiogram [ECG] [EKG]: Secondary | ICD-10-CM | POA: Diagnosis not present

## 2017-05-01 DIAGNOSIS — R1084 Generalized abdominal pain: Secondary | ICD-10-CM | POA: Diagnosis present

## 2017-05-01 DIAGNOSIS — Z87891 Personal history of nicotine dependence: Secondary | ICD-10-CM | POA: Insufficient documentation

## 2017-05-01 DIAGNOSIS — E119 Type 2 diabetes mellitus without complications: Secondary | ICD-10-CM | POA: Insufficient documentation

## 2017-05-01 DIAGNOSIS — Z79899 Other long term (current) drug therapy: Secondary | ICD-10-CM | POA: Diagnosis not present

## 2017-05-01 DIAGNOSIS — I5042 Chronic combined systolic (congestive) and diastolic (congestive) heart failure: Secondary | ICD-10-CM | POA: Insufficient documentation

## 2017-05-01 LAB — COMPREHENSIVE METABOLIC PANEL
ALBUMIN: 3.4 g/dL — AB (ref 3.5–5.0)
ALT: 18 U/L (ref 17–63)
AST: 24 U/L (ref 15–41)
Alkaline Phosphatase: 148 U/L — ABNORMAL HIGH (ref 38–126)
Anion gap: 15 (ref 5–15)
BUN: 54 mg/dL — AB (ref 6–20)
CHLORIDE: 99 mmol/L — AB (ref 101–111)
CO2: 23 mmol/L (ref 22–32)
CREATININE: 10.8 mg/dL — AB (ref 0.61–1.24)
Calcium: 8.4 mg/dL — ABNORMAL LOW (ref 8.9–10.3)
GFR calc Af Amer: 5 mL/min — ABNORMAL LOW (ref >60)
GFR, EST NON AFRICAN AMERICAN: 5 mL/min — AB (ref >60)
Glucose, Bld: 77 mg/dL (ref 65–99)
POTASSIUM: 4.7 mmol/L (ref 3.5–5.1)
SODIUM: 137 mmol/L (ref 135–145)
Total Bilirubin: 0.6 mg/dL (ref 0.3–1.2)
Total Protein: 7.6 g/dL (ref 6.5–8.1)

## 2017-05-01 LAB — PROTIME-INR
INR: 1.27
Prothrombin Time: 15.8 seconds — ABNORMAL HIGH (ref 11.4–15.2)

## 2017-05-01 LAB — CBC
HEMATOCRIT: 29.3 % — AB (ref 39.0–52.0)
Hemoglobin: 9.3 g/dL — ABNORMAL LOW (ref 13.0–17.0)
MCH: 28.2 pg (ref 26.0–34.0)
MCHC: 31.7 g/dL (ref 30.0–36.0)
MCV: 88.8 fL (ref 78.0–100.0)
PLATELETS: 200 10*3/uL (ref 150–400)
RBC: 3.3 MIL/uL — ABNORMAL LOW (ref 4.22–5.81)
RDW: 16.7 % — AB (ref 11.5–15.5)
WBC: 6.2 10*3/uL (ref 4.0–10.5)

## 2017-05-01 LAB — LIPASE, BLOOD: LIPASE: 118 U/L — AB (ref 11–51)

## 2017-05-01 MED ORDER — AMLODIPINE BESYLATE 5 MG PO TABS
10.0000 mg | ORAL_TABLET | Freq: Once | ORAL | Status: AC
  Administered 2017-05-01: 10 mg via ORAL
  Filled 2017-05-01: qty 2

## 2017-05-01 MED ORDER — LISINOPRIL 20 MG PO TABS
20.0000 mg | ORAL_TABLET | Freq: Once | ORAL | Status: AC
  Administered 2017-05-01: 20 mg via ORAL
  Filled 2017-05-01: qty 1

## 2017-05-01 MED ORDER — LABETALOL HCL 5 MG/ML IV SOLN
10.0000 mg | Freq: Once | INTRAVENOUS | Status: AC
  Administered 2017-05-01: 10 mg via INTRAVENOUS
  Filled 2017-05-01: qty 4

## 2017-05-01 MED ORDER — ISOSORBIDE MONONITRATE ER 30 MG PO TB24
60.0000 mg | ORAL_TABLET | Freq: Once | ORAL | Status: AC
  Administered 2017-05-01: 60 mg via ORAL
  Filled 2017-05-01: qty 2

## 2017-05-01 MED ORDER — ONDANSETRON HCL 4 MG/2ML IJ SOLN
4.0000 mg | Freq: Once | INTRAMUSCULAR | Status: AC
  Administered 2017-05-01: 4 mg via INTRAVENOUS
  Filled 2017-05-01: qty 2

## 2017-05-01 MED ORDER — HYDROMORPHONE HCL 1 MG/ML IJ SOLN
1.0000 mg | Freq: Once | INTRAMUSCULAR | Status: AC
  Administered 2017-05-01: 1 mg via INTRAVENOUS
  Filled 2017-05-01: qty 1

## 2017-05-01 MED ORDER — HYDRALAZINE HCL 25 MG PO TABS
100.0000 mg | ORAL_TABLET | Freq: Once | ORAL | Status: AC
  Administered 2017-05-01: 100 mg via ORAL
  Filled 2017-05-01: qty 4

## 2017-05-01 MED ORDER — IOPAMIDOL (ISOVUE-370) INJECTION 76%
INTRAVENOUS | Status: AC
  Filled 2017-05-01: qty 100

## 2017-05-01 MED ORDER — OXYCODONE HCL 5 MG PO TABS
5.0000 mg | ORAL_TABLET | Freq: Once | ORAL | Status: AC
  Administered 2017-05-01: 5 mg via ORAL
  Filled 2017-05-01: qty 1

## 2017-05-01 MED ORDER — METOCLOPRAMIDE HCL 5 MG PO TABS: 5.0000 mg | ORAL_TABLET | Freq: Three times a day (TID) | ORAL | 0 refills | Status: DC | PRN

## 2017-05-01 MED ORDER — OXYCODONE HCL 5 MG PO TABS: 5.0000 mg | ORAL_TABLET | ORAL | 0 refills | Status: DC | PRN

## 2017-05-01 NOTE — ED Notes (Signed)
I was asked to put patient back onto monitor, but Pt. Didn't want me to hook him back onto Monitor, stated he would be going back to bathroom in a min.

## 2017-05-01 NOTE — ED Notes (Signed)
Patient transported to CT 

## 2017-05-01 NOTE — ED Provider Notes (Signed)
[facility] DEPT Provider Note   CSN: [id number] Arrival date & time: 05/01/17  0418     History   Chief Complaint Chief Complaint  Patient presents with  . Abdominal Pain  . Back Pain    HPI Frank [name] is a 51 y.o. male.  HPI   51 year old male with extensive past medical history as below who presents with diffuse abdominal pain. The patient has chronic abdominal pain. He was recently admitted for DVT on subtherapeutic Coumadin as well as hyperkalemia secondary to dialysis nonadherence. Returns today with ongoing nausea. He states he has been going to dialysis. He last went on Tuesday. He is due for dialysis today. He states that over the last 48 hours, he has had persistent worsening diffuse abdominal pain, nausea, and vomiting. He has been unable to eat or drink due to this. He has a history of chronic abdominal pain but states this is worse than usual. He denies any recent medication changes. He states he's been unable to keep his Coumadin down, so he presented due to concern for this. He is scheduled to undergo dialysis later this afternoon. Denies any fevers or chills. No alcohol use. No recent sick contacts. No fevers or chills.  Past Medical History:  Diagnosis Date  . Anemia   . Anxiety   . Chronic combined systolic and diastolic CHF (congestive heart failure) (HCC)    a. EF 20-25% by echo in 08/2015 b. echo 10/2015: EF 35-40%, diffuse HK, severe LAE, moderate RAE, small pericardial effusion  . Complication of anesthesia    itching, sore throat  . Depression   . Dialysis patient ([place])   . DVT (deep venous thrombosis) ([place]) 02/2017  . ESRD (end stage renal disease) ([place])    due to HTN per patient, followed at [facility], s/p failed kidney transplant - dialysis Tue, Th, Sat  . Hyperkalemia 12/2015  . Hypertension   . Junctional rhythm    a. noted in 08/2015: hyperkalemic at that time  b. 12/2015: presented in junctional rhythm w/ K+ of 6.6. Resolved with improvement of  K+ levels.  . Nonischemic cardiomyopathy ([place])    a. 08/2014: cath showing minimal CAD, but tortuous arteries noted.   . Personal history of DVT (deep vein thrombosis)/ PE 05/26/2016   In Oct 2015 had small subsemental LUL PE w/o DVT (LE dopplers neg) and was felt to be HD cath related, treated w coumadin.  IN May 2016 had small vein DVT (acute/subacute) in the R basilic/ brachial veins of the RUE, resumed on coumadin.  Had R sided HD cath at that time.    . Renal insufficiency   . Shortness of breath   . Type II diabetes mellitus (HCC)    No history per patient, but remains under history as A1c would not be accurate given on dialysis    Patient Active Problem List   Diagnosis Date Noted  . Recurrent deep venous thrombosis ([place]) 04/27/2017  . Marijuana abuse 04/21/2017  . Acute DVT (deep venous thrombosis) ([place]) 03/13/2017  . DVT (deep venous thrombosis) ([place]) 03/11/2017  . Anemia 02/24/2017  . Volume overload 01/13/2017  . Aortic atherosclerosis ([place]) 01/05/2017  . Abdominal pain 08/04/2016  . Uremia 06/07/2016  . Hyperkalemia 05/29/2016  . GERD (gastroesophageal reflux disease) 05/29/2016  . Personal history of DVT (deep vein thrombosis)/ PE 05/26/2016  . Nonischemic cardiomyopathy ([place]) 01/09/2016  . Bilateral low back pain without sciatica   . Renal cyst, left 10/30/2015  . Constipation by delayed colonic  transit 10/30/2015  . Acute on chronic combined systolic and diastolic congestive heart failure ([place]) 09/23/2015  . Chest pain 09/08/2015  . Adjustment disorder with mixed anxiety and depressed mood 08/20/2015  . Essential hypertension 01/02/2015  . Dyslipidemia   . Malignant hypertension 11/29/2014  . ESRD on hemodialysis ([place])   . Acute pulmonary edema (HCC)   . DM (diabetes mellitus), type 2, uncontrolled, with renal complications ([place])   . Complex sleep apnea syndrome 05/05/2014  . Anemia of chronic kidney failure 06/24/2013  . Nausea & vomiting 06/24/2013    Past  Surgical History:  Procedure Laterality Date  . CAPD INSERTION    . CAPD REMOVAL    . INGUINAL HERNIA REPAIR Right 02/14/2015   Procedure: REPAIR INCARCERATED RIGHT INGUINAL HERNIA;  Surgeon: [name], MD;  Location: [facility];  Service: General;  Laterality: Right;  . INSERTION OF DIALYSIS CATHETER Right 09/23/2015   Procedure: exchange of Right internal Dialysis Catheter.;  Surgeon: [name], MD;  Location: [facility];  Service: Vascular;  Laterality: Right;  . IR GENERIC HISTORICAL  07/16/2016   IR US GUIDE VASC ACCESS LEFT 07/16/2016 Corrie Mckusick, DO [facility]-INTER[facility]  . IR GENERIC HISTORICAL Left 07/16/2016   IR THROMBECTOMY AV FISTULA W/THROMBOLYSIS/PTA INC/SHUNT/IMG LEFT 07/16/2016 Corrie Mckusick, DO [facility]  . KIDNEY RECEIPIENT  2006   failed and started HD in March 2014  . LEFT HEART CATHETERIZATION WITH CORONARY ANGIOGRAM N/A 09/02/2014   Procedure: LEFT HEART CATHETERIZATION WITH CORONARY ANGIOGRAM;  Surgeon: [name], MD;  Location: [facility] CATH LAB;  Service: Cardiovascular;  Laterality: N/A;       Home Medications    Prior to Admission medications   Medication Sig Start Date End Date Taking? Authorizing Provider  amLODipine (NORVASC) 10 MG tablet Take 1 tablet (10 mg total) by mouth at bedtime. 12/01/16  Yes Rice, Resa Miner, MD  carvedilol (COREG) 6.25 MG tablet Take 12.5 mg by mouth 2 (two) times daily. 04/03/17  Yes [provider]  cloNIDine (CATAPRES - DOSED IN MG/24 HR) 0.3 mg/24hr patch Place 0.3 mg onto the skin once a week. On Wednesday  12/31/16  Yes [provider]  dicyclomine (BENTYL) 20 MG tablet Take 20 mg by mouth every 6 (six) hours. 04/26/17  Yes [provider]  gabapentin (NEURONTIN) 100 MG capsule Take 100 mg by mouth at bedtime. 03/20/17  Yes [provider]  hydrALAZINE (APRESOLINE) 100 MG tablet Take 1 tablet (100 mg total) by mouth 2 (two) times daily. 01/07/17  Yes Ledell Noss, MD  isosorbide mononitrate (IMDUR) 60  MG 24 hr tablet Take 1 tablet (60 mg total) by mouth daily. 01/08/17  Yes Ledell Noss, MD  lisinopril (PRINIVIL,ZESTRIL) 20 MG tablet Take 20 mg by mouth daily. 03/20/17  Yes [provider]  ondansetron (ZOFRAN ODT) 8 MG disintegrating tablet 33m ODT q4 hours prn nausea Patient taking differently: Take 8 mg by mouth every 4 (four) hours as needed for nausea.  04/22/17  Yes Vann, Jessica U, DO  pantoprazole (PROTONIX) 40 MG tablet Take 1 tablet (40 mg total) by mouth 2 (two) times daily before a meal. 12/01/16  Yes Rice, CResa Miner MD  promethazine (PHENERGAN) 25 MG tablet Take 25 mg by mouth every 8 (eight) hours as needed. 04/27/17  Yes [provider]  Respiratory Therapy Supplies MISC 1 each daily. CPAP MACHINE   Yes [provider]  sevelamer carbonate (RENVELA) 800 MG tablet Take 3 tablets (2,400 mg total) by mouth  3 (three) times daily with meals. 04/22/17  Yes Vann, Jessica U, DO  traMADol (ULTRAM) 50 MG tablet Take 1 tablet (50 mg total) by mouth every 12 (twelve) hours as needed for moderate pain or severe pain. 12/01/16  Yes Rice, Resa Miner, MD  warfarin (COUMADIN) 5 MG tablet Take 1.5 tablets (7.5 mg total) by mouth daily. 04/22/17  Yes Vann, Jessica U, DO  metoCLOPramide (REGLAN) 5 MG tablet Take 1 tablet (5 mg total) by mouth every 8 (eight) hours as needed for nausea or vomiting. 05/01/17   Duffy [facility], MD  oxyCODONE (ROXICODONE) 5 MG immediate release tablet Take 1 tablet (5 mg total) by mouth every 4 (four) hours as needed for severe pain. 05/01/17   Duffy [facility], MD    Family History Family History  Problem Relation Age of Onset  . Hypertension Other     Social History Social History  Substance Use Topics  . Smoking status: Former Smoker    Packs/day: 0.00    Years: 1.00    Types: Cigarettes  . Smokeless tobacco: Never Used     Comment: quit Jan 2014  . Alcohol use No     Allergies   Butalbital-apap-caffeine; Ferrlecit [na ferric  gluc cplx in sucrose]; Minoxidil; Tylenol [acetaminophen]; and Darvocet [propoxyphene n-acetaminophen]   Review of Systems Review of Systems  Constitutional: Positive for fatigue.  Gastrointestinal: Positive for abdominal pain, nausea and vomiting.  Neurological: Positive for weakness.  All other systems reviewed and are negative.    Physical Exam Updated Vital Signs BP (!) 179/118 (BP Location: Right Arm)   Pulse 77   Temp 97.6 F (36.4 C) (Oral)   Resp 16   Ht _0  (1.88 m)   Wt 73.9 kg (163 lb)   SpO2 100%   BMI 20.93 kg/m   Physical Exam  Constitutional: He is oriented to person, place, and time. He appears well-developed and well-nourished. No distress.  HENT:  Head: Normocephalic and atraumatic.  Eyes: Conjunctivae are normal.  Neck: Neck supple.  Cardiovascular: Normal rate, regular rhythm and normal heart sounds.  Exam reveals no friction rub.   No murmur heard. Pulmonary/Chest: Effort normal and breath sounds normal. No respiratory distress. He has no wheezes. He has no rales.  Abdominal: Soft. Bowel sounds are normal. He exhibits no distension. There is generalized tenderness. There is guarding. There is no rebound.  Musculoskeletal: He exhibits no edema.  Right upper extremity non-edematous. Distal pulses 2+. Normal sensation and strength.  Neurological: He is alert and oriented to person, place, and time. He exhibits normal muscle tone.  Skin: Skin is warm. Capillary refill takes less than 2 seconds.  Psychiatric: He has a normal mood and affect.  Nursing note and vitals reviewed.    ED Treatments / Results  Labs (all labs ordered are listed, but only abnormal results are displayed) Labs Reviewed  LIPASE, BLOOD - Abnormal; Notable for the following:       Result Value   Lipase 118 (*)    All other components within normal limits  COMPREHENSIVE METABOLIC PANEL - Abnormal; Notable for the following:    Chloride 99 (*)    BUN 54 (*)    Creatinine, Ser  10.80 (*)    Calcium 8.4 (*)    Albumin 3.4 (*)    Alkaline Phosphatase 148 (*)    GFR calc non Af Amer 5 (*)    GFR calc Af Amer 5 (*)    All other components within normal limits  CBC - Abnormal; Notable for the following:    RBC 3.30 (*)    Hemoglobin 9.3 (*)    HCT 29.3 (*)    RDW 16.7 (*)    All other components within normal limits  PROTIME-INR - Abnormal; Notable for the following:    Prothrombin Time 15.8 (*)    All other components within normal limits    EKG  EKG Interpretation  Date/Time:  Thursday May 01 2017 04:42:06 EDT Ventricular Rate:  84 PR Interval:  172 QRS Duration: 86 QT Interval:  442 QTC Calculation: 522 R Axis:   -32 Text Interpretation:  Normal sinus rhythm Possible Left atrial enlargement Left axis deviation T wave abnormality, consider lateral ischemia Prolonged QT Abnormal ECG No significant change since last tracing Confirmed by Duffy Bruce [phone]) on 05/01/2017 8:18:42 AM       Radiology Ct Abdomen Pelvis Wo Contrast  Result Date: 05/01/2017 CLINICAL DATA:  Acute abdominal pain, back pain, pancreatitis, dialysis dependent EXAM: CT ABDOMEN AND PELVIS WITHOUT CONTRAST TECHNIQUE: Multidetector CT imaging of the abdomen and pelvis was performed following the standard protocol without IV contrast. COMPARISON:  04/20/2017 FINDINGS: Lower chest: Marked cardiomegaly without significant pericardial effusion. Similar bibasilar atelectasis. Trace right pleural effusion. Atherosclerosis of the lower thoracic aorta. Hepatobiliary: No definite focal hepatic abnormality or intrahepatic biliary dilatation within the limits of noncontrast imaging. Gallbladder and biliary system unremarkable. Trace perihepatic fluid along the right inferior liver margin. Pancreas: Small amount of peri pancreatic free fluid along the pancreas body and tail compatible with pancreatitis. No ductal dilatation. Limited assessment without contrast. Spleen: Normal in size without  focal abnormality. Adrenals/Urinary Tract: Normal adrenal glands for age. Chronic renal atrophy bilaterally of the native kidneys. Stable indeterminate 4.4 cm left renal mass as noted before. No renal obstruction or hydronephrosis. Chronic changes from a left pelvic renal transplant. Bladder is collapsed. Stomach/Bowel: Limited without oral contrast. Negative for bowel obstruction, significant dilatation, ileus, free air. Limited with motion artifact as well. No new fluid collection, definite abscess or hemorrhage. Vascular/Lymphatic: Extensive abdominopelvic atherosclerosis as before. No significant aneurysm. Stable prominent retroperitoneal nodes. Reproductive: Seminal vesicles are not enlarged. Prostate gland normal in size. Vascular calcifications noted. Other: No inguinal or abdominal wall hernia. Musculoskeletal: No acute osseous finding. IMPRESSION: Small amount of peri pancreatic free fluid about the pancreas body and tail compatible with mild pancreatitis. No formed fluid collection, large pseudo cyst, or abscess. Trace perihepatic fluid also noted. Limited assessment without IV contrast. Stable cardiomegaly without CHF Chronic renal atrophy and a remote left pelvic transplant kidney. Chronic indeterminate 4.4 cm left renal mass. Extensive abdominal and pelvic atherosclerosis Electronically Signed   By: Jerilynn Mages.  Shick M.D.   On: 05/01/2017 11:19    Procedures Procedures (including critical care time)  Medications Ordered in ED Medications  HYDROmorphone (DILAUDID) injection 1 mg (1 mg Intravenous Given 05/01/17 0901)  ondansetron (ZOFRAN) injection 4 mg (4 mg Intravenous Given 05/01/17 0901)  labetalol (NORMODYNE,TRANDATE) injection 10 mg (10 mg Intravenous Given 05/01/17 0905)  HYDROmorphone (DILAUDID) injection 1 mg (1 mg Intravenous Given 05/01/17 1048)  amLODipine (NORVASC) tablet 10 mg (10 mg Oral Given 05/01/17 1139)  hydrALAZINE (APRESOLINE) tablet 100 mg (100 mg Oral Given 05/01/17 1139)    isosorbide mononitrate (IMDUR) 24 hr tablet 60 mg (60 mg Oral Given 05/01/17 1140)  lisinopril (PRINIVIL,ZESTRIL) tablet 20 mg (20 mg Oral Given 05/01/17 1139)  oxyCODONE (Oxy IR/ROXICODONE) immediate release tablet 5 mg (5 mg Oral Given 05/01/17 1140)  Initial Impression / Assessment and Plan / ED Course  I have reviewed the triage vital signs and the nursing notes.  Pertinent labs & imaging results that were available during my care of the patient were reviewed by me and considered in my medical decision making (see chart for details).     51 year old male with past medical history as above who presents with diffuse abdominal pain, nausea, vomiting. Patient has history of chronically elevated lipase but it seems to be more elevated today. His lab work is otherwise reassuring and at its baseline. He is due for dialysis today and potassium is within normal limits. I suspect patient has likely acute on chronic pancreatitis. CT scan shows mild pancreatitis without acute complication. He is subtherapeutic on his Coumadin level, but this is an ongoing, chronic issue and this is secondary to upper extremity clot, not atrial fibrillation, and I do not suspect mesenteric ischemia. His pain is improved with analgesia here in the ER. I discussed admission versus discharge with supportive care at home. Patient would like to return home. He feels that he has comfortable with this and I will provide prescription for pain control analgesia. [facility] controlled substance database was reviewed. Otherwise, I encouraged the patient to return immediately with any worsening pain, persistent nausea or vomiting, or other concerning symptoms. He will report to dialysis after discharge. I encouraged him to return if his pain is uncontrolled or he would desire admission.  This note was prepared with assistance of Systems analyst. Occasional wrong-word or sound-a-like substitutions may have occurred due to  the inherent limitations of voice recognition software.  Final Clinical Impressions(s) / ED Diagnoses   Final diagnoses:  Other acute pancreatitis without infection or necrosis    New Prescriptions Discharge Medication List as of 05/01/2017 12:19 PM    START taking these medications   Details  oxyCODONE (ROXICODONE) 5 MG immediate release tablet Take 1 tablet (5 mg total) by mouth every 4 (four) hours as needed for severe pain., Starting Thu 05/01/2017, Print         Duffy [facility], MD 05/02/17 1233

## 2017-05-01 NOTE — ED Notes (Signed)
Patient walking in hallway at this time, stated that he has to walk because of pain in back that it's better for him to move, and also he was asked to give a Urine, but he stated he doesn't urinate.

## 2017-05-01 NOTE — ED Triage Notes (Signed)
Pt c/o back pain and generalized abdominal pain. Also states that he has a blood clot in his right arm and that his INR has not been therapeutic. Last dialysis Tuesday, denies shortness of breath and chest pain.

## 2017-05-01 NOTE — ED Notes (Signed)
Per patient, he does not make urine.

## 2017-05-02 DIAGNOSIS — R112 Nausea with vomiting, unspecified: Secondary | ICD-10-CM | POA: Diagnosis not present

## 2017-05-02 DIAGNOSIS — N186 End stage renal disease: Secondary | ICD-10-CM | POA: Diagnosis not present

## 2017-05-02 DIAGNOSIS — Z79899 Other long term (current) drug therapy: Secondary | ICD-10-CM | POA: Diagnosis not present

## 2017-05-02 DIAGNOSIS — Z992 Dependence on renal dialysis: Secondary | ICD-10-CM | POA: Diagnosis not present

## 2017-05-02 DIAGNOSIS — I120 Hypertensive chronic kidney disease with stage 5 chronic kidney disease or end stage renal disease: Secondary | ICD-10-CM | POA: Diagnosis not present

## 2017-05-02 DIAGNOSIS — Z888 Allergy status to other drugs, medicaments and biological substances status: Secondary | ICD-10-CM | POA: Diagnosis not present

## 2017-05-02 DIAGNOSIS — K5900 Constipation, unspecified: Secondary | ICD-10-CM | POA: Diagnosis not present

## 2017-05-02 DIAGNOSIS — Z886 Allergy status to analgesic agent status: Secondary | ICD-10-CM | POA: Diagnosis not present

## 2017-05-02 DIAGNOSIS — Z87892 Personal history of anaphylaxis: Secondary | ICD-10-CM | POA: Diagnosis not present

## 2017-05-02 DIAGNOSIS — R160 Hepatomegaly, not elsewhere classified: Secondary | ICD-10-CM | POA: Diagnosis not present

## 2017-05-02 DIAGNOSIS — R188 Other ascites: Secondary | ICD-10-CM | POA: Diagnosis not present

## 2017-05-02 DIAGNOSIS — R1010 Upper abdominal pain, unspecified: Secondary | ICD-10-CM | POA: Diagnosis not present

## 2017-05-02 DIAGNOSIS — K529 Noninfective gastroenteritis and colitis, unspecified: Secondary | ICD-10-CM | POA: Diagnosis not present

## 2017-05-02 DIAGNOSIS — K828 Other specified diseases of gallbladder: Secondary | ICD-10-CM | POA: Diagnosis not present

## 2017-05-02 DIAGNOSIS — Z7901 Long term (current) use of anticoagulants: Secondary | ICD-10-CM | POA: Diagnosis not present

## 2017-05-02 DIAGNOSIS — E1122 Type 2 diabetes mellitus with diabetic chronic kidney disease: Secondary | ICD-10-CM | POA: Diagnosis not present

## 2017-05-02 IMAGING — CR DG CHEST 1V PORT
1 series · 1 of 1 positions shown · non-contrast
Comparison: 11/28/2014

CLINICAL DATA: Shortness of breath, dialysis

EXAM:
PORTABLE CHEST - 1 VIEW

[AP]
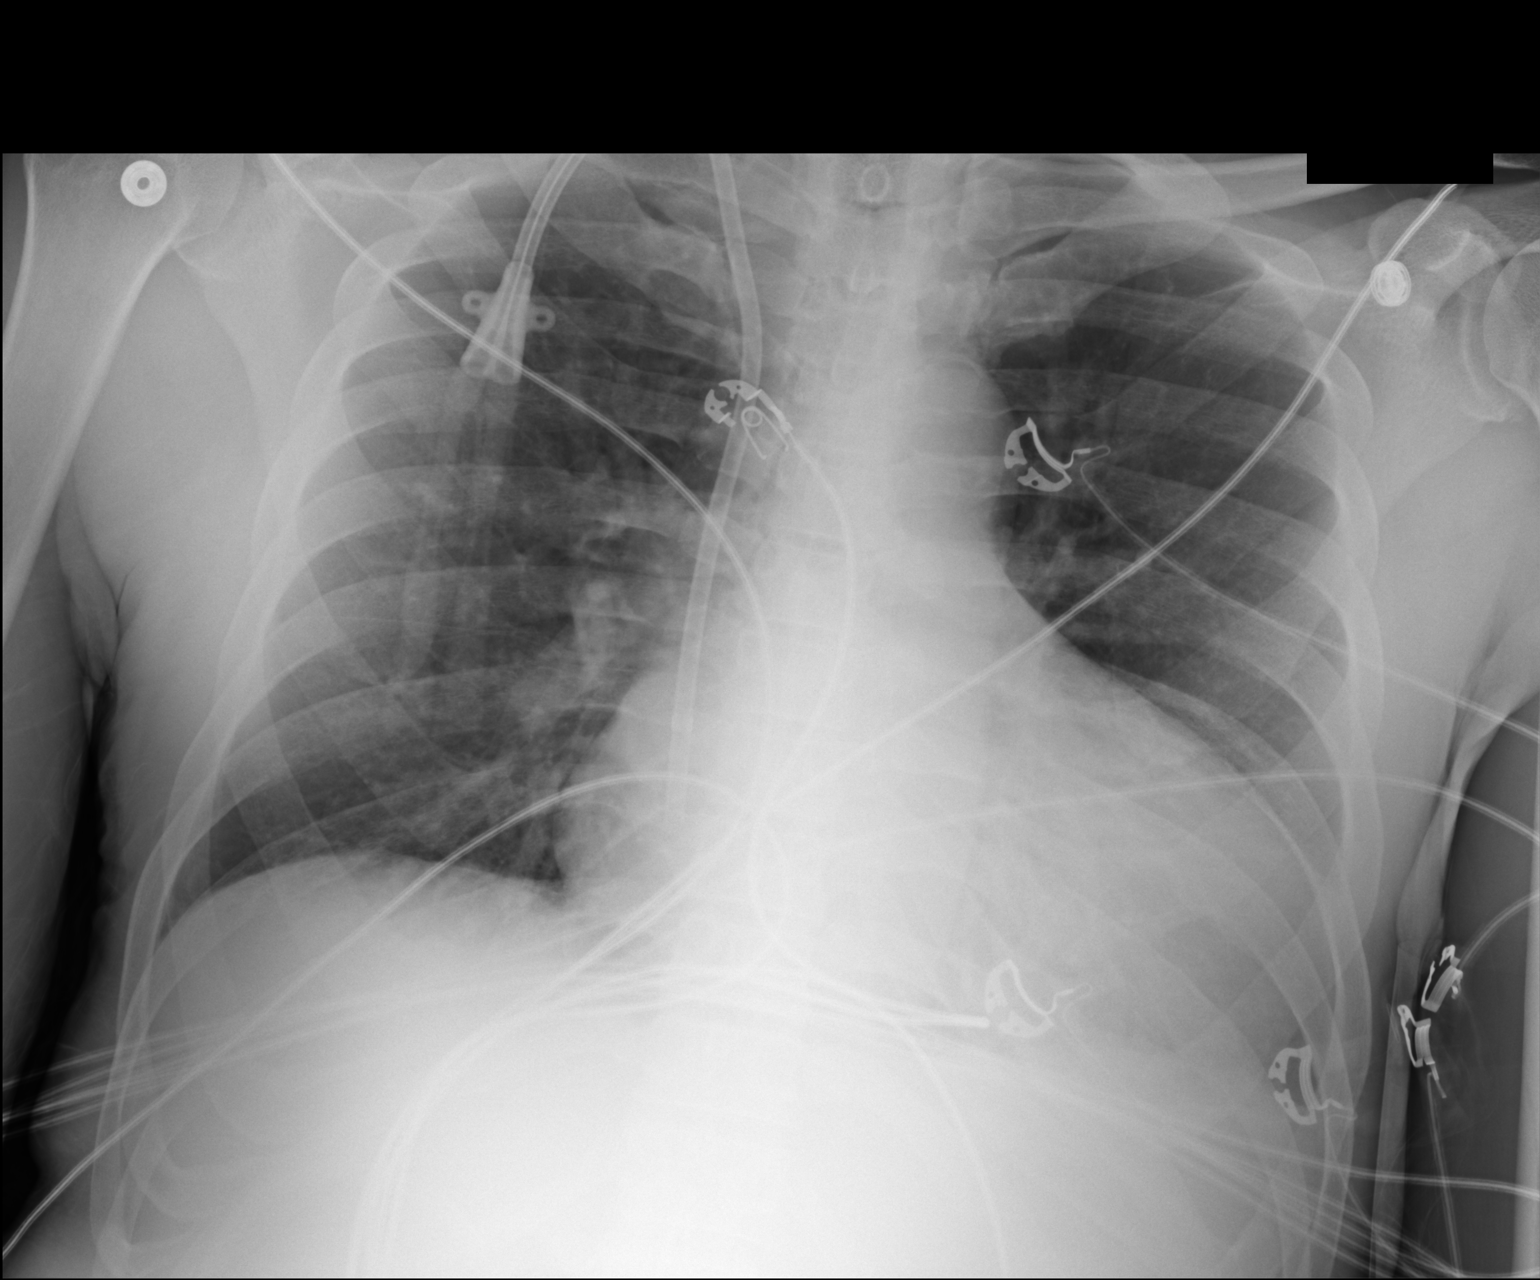

[1 of 1 positions shown; findings below may reference images not displayed]

FINDINGS: Cardiomegaly again noted. Dual lumen right IJ dialysis catheter with
tip in right atrium is unchanged in position. No acute infiltrate or
pleural effusion. No pulmonary edema.
IMPRESSION: No active disease.

## 2017-05-03 ENCOUNTER — Encounter (HOSPITAL_COMMUNITY): Payer: Self-pay

## 2017-05-03 DIAGNOSIS — Z7901 Long term (current) use of anticoagulants: Secondary | ICD-10-CM

## 2017-05-03 DIAGNOSIS — I82621 Acute embolism and thrombosis of deep veins of right upper extremity: Secondary | ICD-10-CM | POA: Diagnosis not present

## 2017-05-03 DIAGNOSIS — N2581 Secondary hyperparathyroidism of renal origin: Secondary | ICD-10-CM | POA: Diagnosis present

## 2017-05-03 DIAGNOSIS — I132 Hypertensive heart and chronic kidney disease with heart failure and with stage 5 chronic kidney disease, or end stage renal disease: Secondary | ICD-10-CM | POA: Diagnosis not present

## 2017-05-03 DIAGNOSIS — Z992 Dependence on renal dialysis: Secondary | ICD-10-CM

## 2017-05-03 DIAGNOSIS — E1122 Type 2 diabetes mellitus with diabetic chronic kidney disease: Secondary | ICD-10-CM | POA: Diagnosis present

## 2017-05-03 DIAGNOSIS — D631 Anemia in chronic kidney disease: Secondary | ICD-10-CM | POA: Diagnosis not present

## 2017-05-03 DIAGNOSIS — I5042 Chronic combined systolic (congestive) and diastolic (congestive) heart failure: Secondary | ICD-10-CM | POA: Diagnosis not present

## 2017-05-03 DIAGNOSIS — N186 End stage renal disease: Secondary | ICD-10-CM | POA: Diagnosis not present

## 2017-05-03 DIAGNOSIS — Z86718 Personal history of other venous thrombosis and embolism: Secondary | ICD-10-CM | POA: Diagnosis not present

## 2017-05-03 DIAGNOSIS — K859 Acute pancreatitis without necrosis or infection, unspecified: Secondary | ICD-10-CM | POA: Diagnosis not present

## 2017-05-03 DIAGNOSIS — K219 Gastro-esophageal reflux disease without esophagitis: Secondary | ICD-10-CM | POA: Diagnosis present

## 2017-05-03 DIAGNOSIS — Z23 Encounter for immunization: Secondary | ICD-10-CM | POA: Diagnosis not present

## 2017-05-03 DIAGNOSIS — N2889 Other specified disorders of kidney and ureter: Secondary | ICD-10-CM | POA: Diagnosis present

## 2017-05-03 DIAGNOSIS — Z87891 Personal history of nicotine dependence: Secondary | ICD-10-CM

## 2017-05-03 DIAGNOSIS — Z79899 Other long term (current) drug therapy: Secondary | ICD-10-CM

## 2017-05-03 DIAGNOSIS — Z86711 Personal history of pulmonary embolism: Secondary | ICD-10-CM

## 2017-05-03 LAB — CBC
HEMATOCRIT: 28.5 % — AB (ref 39.0–52.0)
HEMOGLOBIN: 9 g/dL — AB (ref 13.0–17.0)
MCH: 28.5 pg (ref 26.0–34.0)
MCHC: 31.6 g/dL (ref 30.0–36.0)
MCV: 90.2 fL (ref 78.0–100.0)
Platelets: 191 10*3/uL (ref 150–400)
RBC: 3.16 MIL/uL — AB (ref 4.22–5.81)
RDW: 17.5 % — ABNORMAL HIGH (ref 11.5–15.5)
WBC: 5.4 10*3/uL (ref 4.0–10.5)

## 2017-05-03 LAB — COMPREHENSIVE METABOLIC PANEL
ALBUMIN: 3.2 g/dL — AB (ref 3.5–5.0)
ALK PHOS: 168 U/L — AB (ref 38–126)
ALT: 15 U/L — ABNORMAL LOW (ref 17–63)
ANION GAP: 15 (ref 5–15)
AST: 23 U/L (ref 15–41)
BUN: 56 mg/dL — ABNORMAL HIGH (ref 6–20)
CALCIUM: 8.3 mg/dL — AB (ref 8.9–10.3)
CHLORIDE: 97 mmol/L — AB (ref 101–111)
CO2: 25 mmol/L (ref 22–32)
Creatinine, Ser: 10.35 mg/dL — ABNORMAL HIGH (ref 0.61–1.24)
GFR calc non Af Amer: 5 mL/min — ABNORMAL LOW (ref >60)
GFR, EST AFRICAN AMERICAN: 6 mL/min — AB (ref >60)
GLUCOSE: 133 mg/dL — AB (ref 65–99)
POTASSIUM: 4.5 mmol/L (ref 3.5–5.1)
SODIUM: 137 mmol/L (ref 135–145)
Total Bilirubin: 0.8 mg/dL (ref 0.3–1.2)
Total Protein: 7.4 g/dL (ref 6.5–8.1)

## 2017-05-03 LAB — LIPASE, BLOOD: LIPASE: 220 U/L — AB (ref 11–51)

## 2017-05-03 MED ORDER — ONDANSETRON 4 MG PO TBDP
4.0000 mg | ORAL_TABLET | Freq: Once | ORAL | Status: AC | PRN
  Administered 2017-05-04: 4 mg via ORAL

## 2017-05-03 NOTE — ED Triage Notes (Signed)
Pt. Reports having abdominal pain for 2 to 3 days. Pt. Reports needing treated for pancreatitis. Pt. Reports throwing up 5 times in the last 24 hours. Pt. Reports it is yellow.

## 2017-05-04 ENCOUNTER — Inpatient Hospital Stay (HOSPITAL_COMMUNITY)
Admission: EM | Admit: 2017-05-04 | Discharge: 2017-05-06 | DRG: 438 | Disposition: A | Discharge status: Home / Self Care | Payer: Medicare Other | Attending: Internal Medicine | Admitting: Internal Medicine

## 2017-05-04 ENCOUNTER — Observation Stay (HOSPITAL_COMMUNITY): Payer: Medicare Other

## 2017-05-04 DIAGNOSIS — D631 Anemia in chronic kidney disease: Secondary | ICD-10-CM | POA: Diagnosis not present

## 2017-05-04 DIAGNOSIS — I10 Essential (primary) hypertension: Secondary | ICD-10-CM | POA: Diagnosis not present

## 2017-05-04 DIAGNOSIS — K859 Acute pancreatitis without necrosis or infection, unspecified: Secondary | ICD-10-CM | POA: Diagnosis not present

## 2017-05-04 DIAGNOSIS — Z992 Dependence on renal dialysis: Secondary | ICD-10-CM

## 2017-05-04 DIAGNOSIS — R1010 Upper abdominal pain, unspecified: Secondary | ICD-10-CM | POA: Diagnosis not present

## 2017-05-04 DIAGNOSIS — N189 Chronic kidney disease, unspecified: Secondary | ICD-10-CM

## 2017-05-04 DIAGNOSIS — E1159 Type 2 diabetes mellitus with other circulatory complications: Secondary | ICD-10-CM | POA: Diagnosis present

## 2017-05-04 DIAGNOSIS — N186 End stage renal disease: Secondary | ICD-10-CM

## 2017-05-04 DIAGNOSIS — R1013 Epigastric pain: Secondary | ICD-10-CM | POA: Diagnosis not present

## 2017-05-04 DIAGNOSIS — R109 Unspecified abdominal pain: Secondary | ICD-10-CM

## 2017-05-04 DIAGNOSIS — I152 Hypertension secondary to endocrine disorders: Secondary | ICD-10-CM | POA: Diagnosis present

## 2017-05-04 DIAGNOSIS — K219 Gastro-esophageal reflux disease without esophagitis: Secondary | ICD-10-CM | POA: Diagnosis present

## 2017-05-04 LAB — PROTIME-INR
INR: 1.13
PROTHROMBIN TIME: 14.4 s (ref 11.4–15.2)

## 2017-05-04 MED ORDER — ONDANSETRON HCL 4 MG/2ML IJ SOLN
4.0000 mg | Freq: Four times a day (QID) | INTRAMUSCULAR | Status: DC | PRN
  Administered 2017-05-05 – 2017-05-06 (×3): 4 mg via INTRAVENOUS
  Filled 2017-05-04 (×3): qty 2

## 2017-05-04 MED ORDER — MORPHINE SULFATE (PF) 4 MG/ML IV SOLN
1.0000 mg | INTRAVENOUS | Status: DC | PRN
  Administered 2017-05-04 – 2017-05-06 (×8): 1 mg via INTRAVENOUS
  Filled 2017-05-04 (×8): qty 1

## 2017-05-04 MED ORDER — CARVEDILOL 12.5 MG PO TABS
12.5000 mg | ORAL_TABLET | Freq: Two times a day (BID) | ORAL | Status: DC
  Administered 2017-05-04 – 2017-05-06 (×3): 12.5 mg via ORAL
  Filled 2017-05-04 (×4): qty 1

## 2017-05-04 MED ORDER — CLONIDINE HCL 0.3 MG/24HR TD PTWK
0.3000 mg | MEDICATED_PATCH | TRANSDERMAL | Status: DC
  Administered 2017-05-04: 0.3 mg via TRANSDERMAL
  Filled 2017-05-04: qty 1

## 2017-05-04 MED ORDER — DICYCLOMINE HCL 20 MG PO TABS
20.0000 mg | ORAL_TABLET | Freq: Four times a day (QID) | ORAL | Status: DC
  Administered 2017-05-04 – 2017-05-06 (×5): 20 mg via ORAL
  Filled 2017-05-04 (×6): qty 1

## 2017-05-04 MED ORDER — SEVELAMER CARBONATE 800 MG PO TABS
2400.0000 mg | ORAL_TABLET | Freq: Three times a day (TID) | ORAL | Status: DC
  Administered 2017-05-05 – 2017-05-06 (×2): 2400 mg via ORAL
  Filled 2017-05-04 (×3): qty 3

## 2017-05-04 MED ORDER — GABAPENTIN 100 MG PO CAPS
100.0000 mg | ORAL_CAPSULE | Freq: Every day | ORAL | Status: DC
  Administered 2017-05-04 – 2017-05-06 (×2): 100 mg via ORAL
  Filled 2017-05-04 (×3): qty 1

## 2017-05-04 MED ORDER — SODIUM CHLORIDE 0.9 % IV SOLN: INTRAVENOUS | Status: DC

## 2017-05-04 MED ORDER — SODIUM CHLORIDE 0.9 % IV SOLN: INTRAVENOUS | Status: AC

## 2017-05-04 MED ORDER — OXYCODONE HCL 5 MG PO TABS
5.0000 mg | ORAL_TABLET | ORAL | Status: DC | PRN
  Administered 2017-05-04: 5 mg via ORAL
  Filled 2017-05-04: qty 1

## 2017-05-04 MED ORDER — HYDRALAZINE HCL 25 MG PO TABS
100.0000 mg | ORAL_TABLET | Freq: Two times a day (BID) | ORAL | Status: DC
  Administered 2017-05-04 – 2017-05-06 (×4): 100 mg via ORAL
  Filled 2017-05-04 (×5): qty 4

## 2017-05-04 MED ORDER — ONDANSETRON HCL 4 MG/2ML IJ SOLN
4.0000 mg | Freq: Once | INTRAMUSCULAR | Status: AC
  Administered 2017-05-04: 4 mg via INTRAVENOUS
  Filled 2017-05-04: qty 2

## 2017-05-04 MED ORDER — AMLODIPINE BESYLATE 10 MG PO TABS
10.0000 mg | ORAL_TABLET | Freq: Every day | ORAL | Status: DC
  Administered 2017-05-06: 10 mg via ORAL
  Filled 2017-05-04: qty 1

## 2017-05-04 MED ORDER — PANTOPRAZOLE SODIUM 40 MG PO TBEC
40.0000 mg | DELAYED_RELEASE_TABLET | Freq: Two times a day (BID) | ORAL | Status: DC
  Administered 2017-05-04 – 2017-05-05 (×2): 40 mg via ORAL
  Filled 2017-05-04 (×3): qty 1

## 2017-05-04 MED ORDER — HYDROMORPHONE HCL 1 MG/ML IJ SOLN
1.0000 mg | Freq: Once | INTRAMUSCULAR | Status: AC
  Administered 2017-05-04: 1 mg via INTRAVENOUS
  Filled 2017-05-04: qty 1

## 2017-05-04 MED ORDER — ONDANSETRON 4 MG PO TBDP
4.0000 mg | ORAL_TABLET | Freq: Once | ORAL | Status: AC | PRN
Start: 2017-05-04 — End: 2017-05-04
  Administered 2017-05-04: 4 mg via ORAL
  Filled 2017-05-04: qty 1

## 2017-05-04 MED ORDER — ONDANSETRON 4 MG PO TBDP
ORAL_TABLET | ORAL | Status: AC
  Filled 2017-05-04: qty 1

## 2017-05-04 MED ORDER — WARFARIN - PHARMACIST DOSING INPATIENT: Freq: Every day | Status: DC

## 2017-05-04 MED ORDER — WARFARIN SODIUM 10 MG PO TABS
10.0000 mg | ORAL_TABLET | Freq: Once | ORAL | Status: AC
  Administered 2017-05-04: 10 mg via ORAL
  Filled 2017-05-04: qty 1

## 2017-05-04 MED ORDER — ISOSORBIDE MONONITRATE ER 60 MG PO TB24
60.0000 mg | ORAL_TABLET | Freq: Every day | ORAL | Status: DC
  Administered 2017-05-04 – 2017-05-06 (×3): 60 mg via ORAL
  Filled 2017-05-04: qty 1
  Filled 2017-05-04: qty 2
  Filled 2017-05-04 (×2): qty 1

## 2017-05-04 NOTE — ED Notes (Signed)
Pt requested Diet Coke and pt was given the same.

## 2017-05-04 NOTE — Progress Notes (Signed)
Pt has a BP of 169/114 and a pulse of 48 at this time. Pt has Norvasc, Coreg, and Hydralazine due now. Messaged MD to see if they want to do anything about pulse or if all meds need to be given. Awaiting response.   Eleanora Neighbor, RN

## 2017-05-04 NOTE — ED Notes (Signed)
Pt requested a heated up [place] sandwich and apple juice to drink. Pt was given the same.

## 2017-05-04 NOTE — ED Notes (Signed)
Pt provided with a recliner due to complaints of back pain. Pt reports this is much more comfortable.

## 2017-05-04 NOTE — ED Notes (Signed)
Pt refused to get in hospital gown. Pt also refused to let this tech hook him up to the 5-Lead Heart Monitor. BP & Pulse Ox on Pt.

## 2017-05-04 NOTE — ED Notes (Signed)
Pt ambulatory to the restroom.

## 2017-05-04 NOTE — ED Notes (Signed)
Hospitalist at bedside 

## 2017-05-04 NOTE — Progress Notes (Signed)
ANTICOAGULATION CONSULT NOTE - Initial Consult  Pharmacy Consult for warfarin Indication: hx VTE  Allergies  Allergen Reactions  . Butalbital-Apap-Caffeine Shortness Of Breath, Swelling and Other (See Comments)    Swelling in throat  . Ferrlecit [Na Ferric Gluc Cplx In Sucrose] Shortness Of Breath, Swelling and Other (See Comments)    Swelling in throat, tolerates Venofor  . Minoxidil Shortness Of Breath  . Tylenol [Acetaminophen] Anaphylaxis and Swelling  . Darvocet [Propoxyphene N-Acetaminophen] Hives    Patient Measurements: Height: _0  (188 cm) Weight: 165 lb (74.8 kg) IBW/kg (Calculated) : 82.2  Vital Signs: Temp: 97.5 F (36.4 C) (10/14 1534) Temp Source: Oral (10/14 1534) BP: 157/119 (10/14 1534) Pulse Rate: 77 (10/14 1534)  Labs:  Recent Labs  05/03/17 2203 05/04/17 0817  HGB 9.0*  --   HCT 28.5*  --   PLT 191  --   LABPROT  --  14.4  INR  --  1.13  CREATININE 10.35*  --     Estimated Creatinine Clearance: 8.7 mL/min (A) (by C-G formula based on SCr of 10.35 mg/dL (H)).   Medical History: Past Medical History:  Diagnosis Date  . Anemia   . Anxiety   . Chronic combined systolic and diastolic CHF (congestive heart failure) (HCC)    a. EF 20-25% by echo in 08/2015 b. echo 10/2015: EF 35-40%, diffuse HK, severe LAE, moderate RAE, small pericardial effusion  . Complication of anesthesia    itching, sore throat  . Depression   . Dialysis patient ([place])   . DVT (deep venous thrombosis) ([place]) 02/2017  . ESRD (end stage renal disease) ([place])    due to HTN per patient, followed at [facility], s/p failed kidney transplant - dialysis Tue, Th, Sat  . Hyperkalemia 12/2015  . Hypertension   . Junctional rhythm    a. noted in 08/2015: hyperkalemic at that time  b. 12/2015: presented in junctional rhythm w/ K+ of 6.6. Resolved with improvement of K+ levels.  . Nonischemic cardiomyopathy ([place])    a. 08/2014: cath showing minimal CAD, but tortuous arteries noted.   .  Personal history of DVT (deep vein thrombosis)/ PE 05/26/2016   In Oct 2015 had small subsemental LUL PE w/o DVT (LE dopplers neg) and was felt to be HD cath related, treated w coumadin.  IN May 2016 had small vein DVT (acute/subacute) in the R basilic/ brachial veins of the RUE, resumed on coumadin.  Had R sided HD cath at that time.    . Renal insufficiency   . Shortness of breath   . Type II diabetes mellitus (HCC)    No history per patient, but remains under history as A1c would not be accurate given on dialysis   Assessment: 7 yom presented to the ED with abdominal pain. He is on chronic coumadin but has a known history of non-compliance. INR is subtherapeutic at 1.13. No bleeding noted.   Goal of Therapy:  INR 2-3 Monitor platelets by anticoagulation protocol: Yes   Plan:  Warfarin 32m PO x 1 tonight Daily INR  Harshini Trent, RRande Lawman10/14/2018,4:33 PM

## 2017-05-04 NOTE — ED Provider Notes (Signed)
[facility] DEPT Provider Note   CSN: [id number] Arrival date & time: 05/03/17  2156     History   Chief Complaint Chief Complaint  Patient presents with  . Abdominal Pain    HPI Frank [name] is a 51 y.o. male.  Patient with history of end-stage renal disease, heart failure, DVT on warfarin -- presents with continued generalized abdominal pain with radiation to the back and associated yellow vomiting. Patient states approximately 4 episodes of vomiting over the past 24 hours. No fever, chest pain, shortness of breath. No urinary symptoms or diarrhea. No blood reported in the stool. Patient was seen in emergency department on 05/01/17 and had a CT scan performed after having a mildly elevated lipase. This demonstrated signs of acute pancreatitis. After discussion with patient, he decided to go home with pain medication and nausea medicine. Patient states that his pain has been uncontrolled and the vomiting continues despite treatment. He has tried eating Jell-O, applesauce but has not been able to keep much down. The onset of this condition was acute. The course is worsening. Aggravating factors: palpation. Alleviating factors: none.   Of note, last dialyzed yesterday. Was taken off dialysis after 2-3 hours, instead of usual 4 hours.       Past Medical History:  Diagnosis Date  . Anemia   . Anxiety   . Chronic combined systolic and diastolic CHF (congestive heart failure) (HCC)    a. EF 20-25% by echo in 08/2015 b. echo 10/2015: EF 35-40%, diffuse HK, severe LAE, moderate RAE, small pericardial effusion  . Complication of anesthesia    itching, sore throat  . Depression   . Dialysis patient ([place])   . DVT (deep venous thrombosis) ([place]) 02/2017  . ESRD (end stage renal disease) ([place])    due to HTN per patient, followed at [facility], s/p failed kidney transplant - dialysis Tue, Th, Sat  . Hyperkalemia 12/2015  . Hypertension   . Junctional rhythm    a. noted in 08/2015:  hyperkalemic at that time  b. 12/2015: presented in junctional rhythm w/ K+ of 6.6. Resolved with improvement of K+ levels.  . Nonischemic cardiomyopathy ([place])    a. 08/2014: cath showing minimal CAD, but tortuous arteries noted.   . Personal history of DVT (deep vein thrombosis)/ PE 05/26/2016   In Oct 2015 had small subsemental LUL PE w/o DVT (LE dopplers neg) and was felt to be HD cath related, treated w coumadin.  IN May 2016 had small vein DVT (acute/subacute) in the R basilic/ brachial veins of the RUE, resumed on coumadin.  Had R sided HD cath at that time.    . Renal insufficiency   . Shortness of breath   . Type II diabetes mellitus (HCC)    No history per patient, but remains under history as A1c would not be accurate given on dialysis    Patient Active Problem List   Diagnosis Date Noted  . Recurrent deep venous thrombosis ([place]) 04/27/2017  . Marijuana abuse 04/21/2017  . Acute DVT (deep venous thrombosis) ([place]) 03/13/2017  . DVT (deep venous thrombosis) ([place]) 03/11/2017  . Anemia 02/24/2017  . Volume overload 01/13/2017  . Aortic atherosclerosis ([place]) 01/05/2017  . Abdominal pain 08/04/2016  . Uremia 06/07/2016  . Hyperkalemia 05/29/2016  . GERD (gastroesophageal reflux disease) 05/29/2016  . Personal history of DVT (deep vein thrombosis)/ PE 05/26/2016  . Nonischemic cardiomyopathy ([place]) 01/09/2016  . Bilateral low back pain without sciatica   . Renal cyst, left 10/30/2015  .  Constipation by delayed colonic transit 10/30/2015  . Acute on chronic combined systolic and diastolic congestive heart failure ([place]) 09/23/2015  . Chest pain 09/08/2015  . Adjustment disorder with mixed anxiety and depressed mood 08/20/2015  . Essential hypertension 01/02/2015  . Dyslipidemia   . Malignant hypertension 11/29/2014  . ESRD on hemodialysis ([place])   . Acute pulmonary edema (HCC)   . DM (diabetes mellitus), type 2, uncontrolled, with renal complications ([place])   . Complex sleep apnea  syndrome 05/05/2014  . Anemia of chronic kidney failure 06/24/2013  . Nausea & vomiting 06/24/2013    Past Surgical History:  Procedure Laterality Date  . CAPD INSERTION    . CAPD REMOVAL    . INGUINAL HERNIA REPAIR Right 02/14/2015   Procedure: REPAIR INCARCERATED RIGHT INGUINAL HERNIA;  Surgeon: [name], MD;  Location: [facility];  Service: General;  Laterality: Right;  . INSERTION OF DIALYSIS CATHETER Right 09/23/2015   Procedure: exchange of Right internal Dialysis Catheter.;  Surgeon: [name], MD;  Location: [facility];  Service: Vascular;  Laterality: Right;  . IR GENERIC HISTORICAL  07/16/2016   IR US GUIDE VASC ACCESS LEFT 07/16/2016 Corrie Mckusick, DO [facility]-INTERV RAD  . IR GENERIC HISTORICAL Left 07/16/2016   IR THROMBECTOMY AV FISTULA W/THROMBOLYSIS/PTA INC/SHUNT/IMG LEFT 07/16/2016 Corrie Mckusick, DO [facility]INTER[facility]  . KIDNEY RECEIPIENT  2006   failed and started HD in March 2014  . LEFT HEART CATHETERIZATION WITH CORONARY ANGIOGRAM N/A 09/02/2014   Procedure: LEFT HEART CATHETERIZATION WITH CORONARY ANGIOGRAM;  Surgeon: [name], MD;  Location: [facility] C[facility] LAB;  Service: Cardiovascular;  Laterality: N/A;       Home Medications    Prior to Admission medications   Medication Sig Start Date End Date Taking? Authorizing Provider  amLODipine (NORVASC) 10 MG tablet Take 1 tablet (10 mg total) by mouth at bedtime. 12/01/16   Rice, Resa Miner, MD  carvedilol (COREG) 6.25 MG tablet Take 12.5 mg by mouth 2 (two) times daily. 04/03/17   [provider]  cloNIDine (CATAPRES - DOSED IN MG/24 HR) 0.3 mg/24hr patch Place 0.3 mg onto the skin once a week. On Wednesday  12/31/16   [provider]  dicyclomine (BENTYL) 20 MG tablet Take 20 mg by mouth every 6 (six) hours. 04/26/17   [provider]  gabapentin (NEURONTIN) 100 MG capsule Take 100 mg by mouth at bedtime. 03/20/17   [provider]  hydrALAZINE (APRESOLINE) 100 MG tablet Take 1 tablet (100 mg  total) by mouth 2 (two) times daily. 01/07/17   Ledell Noss, MD  isosorbide mononitrate (IMDUR) 60 MG 24 hr tablet Take 1 tablet (60 mg total) by mouth daily. 01/08/17   Ledell Noss, MD  lisinopril (PRINIVIL,ZESTRIL) 20 MG tablet Take 20 mg by mouth daily. 03/20/17   [provider]  metoCLOPramide (REGLAN) 5 MG tablet Take 1 tablet (5 mg total) by mouth every 8 (eight) hours as needed for nausea or vomiting. 05/01/17   Duffy Bruce, MD  ondansetron (ZOFRAN ODT) 8 MG disintegrating tablet 91m ODT q4 hours prn nausea Patient taking differently: Take 8 mg by mouth every 4 (four) hours as needed for nausea.  04/22/17   VGeradine Girt DO  oxyCODONE (ROXICODONE) 5 MG immediate release tablet Take 1 tablet (5 mg total) by mouth every 4 (four) hours as needed for severe pain. 05/01/17   IDuffy Bruce MD  pantoprazole (PROTONIX) 40 MG tablet Take 1 tablet (40 mg total) by mouth 2 (two) times  daily before a meal. 12/01/16   Rice, Resa Miner, MD  promethazine (PHENERGAN) 25 MG tablet Take 25 mg by mouth every 8 (eight) hours as needed. 04/27/17   [provider]  Respiratory Therapy Supplies MISC 1 each daily. CPAP MACHINE    [provider]  sevelamer carbonate (RENVELA) 800 MG tablet Take 3 tablets (2,400 mg total) by mouth 3 (three) times daily with meals. 04/22/17   [name], DO  traMADol (ULTRAM) 50 MG tablet Take 1 tablet (50 mg total) by mouth every 12 (twelve) hours as needed for moderate pain or severe pain. 12/01/16   Collier Salina, MD  warfarin (COUMADIN) 5 MG tablet Take 1.5 tablets (7.5 mg total) by mouth daily. 04/22/17   [name], DO    Family History Family History  Problem Relation Age of Onset  . Hypertension Other     Social History Social History  Substance Use Topics  . Smoking status: Former Smoker    Packs/day: 0.00    Years: 1.00    Types: Cigarettes  . Smokeless tobacco: Never Used     Comment: quit Jan 2014  . Alcohol use No      Allergies   Butalbital-apap-caffeine; Ferrlecit [na ferric gluc cplx in sucrose]; Minoxidil; Tylenol [acetaminophen]; and Darvocet [propoxyphene n-acetaminophen]   Review of Systems Review of Systems  Constitutional: Negative for fever.  HENT: Negative for rhinorrhea and sore throat.   Eyes: Negative for redness.  Respiratory: Negative for cough.   Cardiovascular: Negative for chest pain.  Gastrointestinal: Positive for abdominal pain, nausea and vomiting. Negative for diarrhea.  Genitourinary: Negative for dysuria.  Musculoskeletal: Positive for back pain. Negative for myalgias.  Skin: Negative for rash.  Neurological: Negative for headaches.     Physical Exam Updated Vital Signs BP (!) 186/125   Pulse 78   Temp 98.5 F (36.9 C) (Oral)   Resp 16   Ht _0  (1.88 m)   Wt 74.8 kg (165 lb)   SpO2 97%   BMI 21.18 kg/m   Physical Exam  Constitutional: He appears well-developed and well-nourished.  HENT:  Head: Normocephalic and atraumatic.  Mouth/Throat: Oropharynx is clear and moist.  Eyes: Conjunctivae are normal. Right eye exhibits no discharge. Left eye exhibits no discharge.  Neck: Normal range of motion. Neck supple.  Cardiovascular: Normal rate, regular rhythm and normal heart sounds.   Pulmonary/Chest: Effort normal and breath sounds normal. No respiratory distress. He has no wheezes. He has no rales.  Abdominal: Soft. There is tenderness. There is no rebound and no guarding.  Neurological: He is alert.  Skin: Skin is warm and dry.  Psychiatric: He has a normal mood and affect.  Nursing note and vitals reviewed.    ED Treatments / Results  Labs (all labs ordered are listed, but only abnormal results are displayed) Labs Reviewed  LIPASE, BLOOD - Abnormal; Notable for the following:       Result Value   Lipase 220 (*)    All other components within normal limits  COMPREHENSIVE METABOLIC PANEL - Abnormal; Notable for the following:    Chloride 97 (*)     Glucose, Bld 133 (*)    BUN 56 (*)    Creatinine, Ser 10.35 (*)    Calcium 8.3 (*)    Albumin 3.2 (*)    ALT 15 (*)    Alkaline Phosphatase 168 (*)    GFR calc non Af Amer 5 (*)    GFR calc Af  Amer 6 (*)    All other components within normal limits  CBC - Abnormal; Notable for the following:    RBC 3.16 (*)    Hemoglobin 9.0 (*)    HCT 28.5 (*)    RDW 17.5 (*)    All other components within normal limits  PROTIME-INR    EKG  EKG Interpretation None       Radiology No results found.  Procedures Procedures (including critical care time)  Medications Ordered in ED Medications  ondansetron (ZOFRAN-ODT) 4 MG disintegrating tablet (not administered)  HYDROmorphone (DILAUDID) injection 1 mg (not administered)  ondansetron (ZOFRAN) injection 4 mg (not administered)  0.9 %  sodium chloride infusion (not administered)  ondansetron (ZOFRAN-ODT) disintegrating tablet 4 mg (4 mg Oral Given 05/04/17 0559)     Initial Impression / Assessment and Plan / ED Course  I have reviewed the triage vital signs and the nursing notes.  Pertinent labs & imaging results that were available during my care of the patient were reviewed by me and considered in my medical decision making (see chart for details).     Patient seen and examined. Reviewed previous records. No elevation of lipase > 100 in the past 3 years.   Vital signs reviewed and are as follows: BP (!) 186/125   Pulse 78   Temp 98.5 F (36.9 C) (Oral)   Resp 16   Ht _0  (1.88 m)   Wt 74.8 kg (165 lb)   SpO2 97%   BMI 21.18 kg/m   Discussed case with Dr. Rex Kras who will see.   Spoke with [facility] who will see and place on observation.   Final Clinical Impressions(s) / ED Diagnoses   Final diagnoses:  Acute pancreatitis, unspecified complication status, unspecified pancreatitis type  ESRD (end stage renal disease) ([place])   Admit for control of acute pancreatitis symptoms.   New Prescriptions New  Prescriptions   No medications on file     Carlisle Cater, Hershal Coria 05/04/17 [place], Wenda Over[place], MD 05/04/17 [phone]

## 2017-05-04 NOTE — ED Notes (Signed)
Pt refused vitals 

## 2017-05-04 NOTE — ED Notes (Signed)
Pt requesting a protein shake. Have paged admitting MD X2 without response. Spoke to NP and pt can have clear liquids at this time until MD is available to speak to the patient. Pt provided clear liquids.

## 2017-05-04 NOTE — H&P (Signed)
[facility] History and Physical  Frank [name] UMP:[id number] DOB: 08-21-1965 DOA: 05/04/2017  Referring physician:  PCP: Lolita Patella, MD  Specialists:   Chief Complaint: abdominal pains, vomiting   HPI: Frank [name] is a 51 y.o. male with PMH of HTN, CHF, DVT (on coumadin), ESRD on HD (TTS), chronic pain, presented with abdominal pains. Patient states that he reports epigastric 8/10 abdominal pains, radiating to his back for 24 hrs. He had 5-6 times of non bloody vomiting. Had had normal bowel movement today. Denies hematochezia or hematemesis. He presented with similar symptoms 3 days ago and found to have mild elevated lipase with ct findings of pancreatitis. He decided to go home with antiemetics and pain medications. He presented to day due to non resolving and worsening abdominal pains and vomiting, inability to tolerate food. He reports previous episodes of pancreatitis.  Denies alcohol use. He denies fever, chills, no acute chest pains, no shortness of breath.  -ED: lipase is mildly elevated from 118 to 220. hospitalist is asked for observation for acute pancreatitis   Review of Systems: The patient denies anorexia, fever, weight loss,, vision loss, decreased hearing, hoarseness, chest pain, syncope, dyspnea on exertion, peripheral edema, balance deficits, hemoptysis, abdominal pain, melena, hematochezia, severe indigestion/heartburn, hematuria, incontinence, genital sores, muscle weakness, suspicious skin lesions, transient blindness, difficulty walking, depression, unusual weight change, abnormal bleeding, enlarged lymph nodes, angioedema, and breast masses.    Past Medical History:  Diagnosis Date  . Anemia   . Anxiety   . Chronic combined systolic and diastolic CHF (congestive heart failure) (HCC)    a. EF 20-25% by echo in 08/2015 b. echo 10/2015: EF 35-40%, diffuse HK, severe LAE, moderate RAE, small pericardial effusion  . Complication of anesthesia    itching, sore  throat  . Depression   . Dialysis patient ([place])   . DVT (deep venous thrombosis) ([place]) 02/2017  . ESRD (end stage renal disease) ([place])    due to HTN per patient, followed at [facility], s/p failed kidney transplant - dialysis Tue, Th, Sat  . Hyperkalemia 12/2015  . Hypertension   . Junctional rhythm    a. noted in 08/2015: hyperkalemic at that time  b. 12/2015: presented in junctional rhythm w/ K+ of 6.6. Resolved with improvement of K+ levels.  . Nonischemic cardiomyopathy ([place])    a. 08/2014: cath showing minimal CAD, but tortuous arteries noted.   . Personal history of DVT (deep vein thrombosis)/ PE 05/26/2016   In Oct 2015 had small subsemental LUL PE w/o DVT (LE dopplers neg) and was felt to be HD cath related, treated w coumadin.  IN May 2016 had small vein DVT (acute/subacute) in the R basilic/ brachial veins of the RUE, resumed on coumadin.  Had R sided HD cath at that time.    . Renal insufficiency   . Shortness of breath   . Type II diabetes mellitus (HCC)    No history per patient, but remains under history as A1c would not be accurate given on dialysis   Past Surgical History:  Procedure Laterality Date  . CAPD INSERTION    . CAPD REMOVAL    . INGUINAL HERNIA REPAIR Right 02/14/2015   Procedure: REPAIR INCARCERATED RIGHT INGUINAL HERNIA;  Surgeon: [name], MD;  Location: [facility];  Service: General;  Laterality: Right;  . INSERTION OF DIALYSIS CATHETER Right 09/23/2015   Procedure: exchange of Right internal Dialysis Catheter.;  Surgeon: [name], MD;  Location: [facility];  Service:  Vascular;  Laterality: Right;  . IR GENERIC HISTORICAL  07/16/2016   IR US GUIDE VASC ACCESS LEFT 07/16/2016 Corrie Mckusick, DO [facility]-INTER[facility]  . IR GENERIC HISTORICAL Left 07/16/2016   IR THROMBECTOMY AV FISTULA W/THROMBOLYSIS/PTA INC/SHUNT/IMG LEFT 07/16/2016 Corrie Mckusick, DO [facility]  . KIDNEY RECEIPIENT  2006   failed and started HD in March 2014  . LEFT HEART CATHETERIZATION WITH CORONARY  ANGIOGRAM N/A 09/02/2014   Procedure: LEFT HEART CATHETERIZATION WITH CORONARY ANGIOGRAM;  Surgeon: [name], MD;  Location[facility] C[facility] LAB;  Service: Cardiovascular;  Laterality: N/A;   Social History:  reports that he has quit smoking. His smoking use included Cigarettes. He smoked 0.00 packs per day for 1.00 year. He has never used smokeless tobacco. He reports that he uses drugs, including Marijuana. He reports that he does not drink alcohol. Home;  where does patient live--home, ALF, SNF? and with whom if at home? Yes;  Can patient participate in ADLs?  Allergies  Allergen Reactions  . Butalbital-Apap-Caffeine Shortness Of Breath, Swelling and Other (See Comments)    Swelling in throat  . Ferrlecit [Na Ferric Gluc Cplx In Sucrose] Shortness Of Breath, Swelling and Other (See Comments)    Swelling in throat, tolerates Venofor  . Minoxidil Shortness Of Breath  . Tylenol [Acetaminophen] Anaphylaxis and Swelling  . Darvocet [Propoxyphene N-Acetaminophen] Hives    Family History  Problem Relation Age of Onset  . Hypertension Other     (be sure to complete)  Prior to Admission medications   Medication Sig Start Date End Date Taking? Authorizing Provider  amLODipine (NORVASC) 10 MG tablet Take 1 tablet (10 mg total) by mouth at bedtime. 12/01/16   Rice, Resa Miner, MD  carvedilol (COREG) 6.25 MG tablet Take 12.5 mg by mouth 2 (two) times daily. 04/03/17   [provider]  cloNIDine (CATAPRES - DOSED IN MG/24 HR) 0.3 mg/24hr patch Place 0.3 mg onto the skin once a week. On Wednesday  12/31/16   [provider]  dicyclomine (BENTYL) 20 MG tablet Take 20 mg by mouth every 6 (six) hours. 04/26/17   [provider]  gabapentin (NEURONTIN) 100 MG capsule Take 100 mg by mouth at bedtime. 03/20/17   [provider]  hydrALAZINE (APRESOLINE) 100 MG tablet Take 1 tablet (100 mg total) by mouth 2 (two) times daily. 01/07/17   Ledell Noss, MD  isosorbide  mononitrate (IMDUR) 60 MG 24 hr tablet Take 1 tablet (60 mg total) by mouth daily. 01/08/17   Ledell Noss, MD  lisinopril (PRINIVIL,ZESTRIL) 20 MG tablet Take 20 mg by mouth daily. 03/20/17   [provider]  metoCLOPramide (REGLAN) 5 MG tablet Take 1 tablet (5 mg total) by mouth every 8 (eight) hours as needed for nausea or vomiting. 05/01/17   Duffy Bruce, MD  ondansetron (ZOFRAN ODT) 8 MG disintegrating tablet 35m ODT q4 hours prn nausea Patient taking differently: Take 8 mg by mouth every 4 (four) hours as needed for nausea.  04/22/17   VGeradine Girt DO  oxyCODONE (ROXICODONE) 5 MG immediate release tablet Take 1 tablet (5 mg total) by mouth every 4 (four) hours as needed for severe pain. 05/01/17   IDuffy Bruce MD  pantoprazole (PROTONIX) 40 MG tablet Take 1 tablet (40 mg total) by mouth 2 (two) times daily before a meal. 12/01/16   Rice, CResa Miner MD  promethazine (PHENERGAN) 25 MG tablet Take 25 mg by mouth every 8 (eight) hours as needed. 04/27/17   [provider]  Respiratory Therapy Supplies MISC 1 each daily. CPAP MACHINE    [provider]  sevelamer carbonate (RENVELA) 800 MG tablet Take 3 tablets (2,400 mg total) by mouth 3 (three) times daily with meals. 04/22/17   [name], DO  traMADol (ULTRAM) 50 MG tablet Take 1 tablet (50 mg total) by mouth every 12 (twelve) hours as needed for moderate pain or severe pain. 12/01/16   Collier Salina, MD  warfarin (COUMADIN) 5 MG tablet Take 1.5 tablets (7.5 mg total) by mouth daily. 04/22/17   [name], DO   Physical Exam: Vitals:   05/03/17 2201 05/04/17 0715  BP: (!) 182/119 (!) 186/125  Pulse: 87 78  Resp: 16   Temp: 98.5 F (36.9 C)   SpO2: 96% 97%     General:  Alert, non distress  Eyes: eom-I, perrla   ENT: no oral ulcers   Neck: supple, no JVD  Cardiovascular: s1,s2 rrr  Respiratory: no wheezing   Abdomen: mild epigastric tenderness.   Skin: no trash.    Musculoskeletal: no pedal edema   Psychiatric: no hallucinations  Neurologic: CN 2-12 intact. Motor 5/5 BL   Labs on Admission:  Basic Metabolic Panel:  Recent Labs Lab 04/27/17 1840 04/28/17 0932 05/01/17 0434 05/03/17 2203  NA 134* 136 137 137  K >7.5* 4.4 4.7 4.5  CL 93* 96* 99* 97*  CO2 19* _0 GLUCOSE 61* 116* 77 133*  BUN 92* 33* 54* 56*  CREATININE 15.38* 8.56* 10.80* 10.35*  CALCIUM 8.5* 8.3* 8.4* 8.3*  PHOS  --  4.8*  --   --    Liver Function Tests:  Recent Labs Lab 04/27/17 1840 04/28/17 0932 05/01/17 0434 05/03/17 2203  AST 24  --  24 23  ALT 15*  --  18 15*  ALKPHOS 127*  --  148* 168*  BILITOT 1.2  --  0.6 0.8  PROT 8.0  --  7.6 7.4  ALBUMIN 3.5 2.9* 3.4* 3.2*    Recent Labs Lab 04/27/17 1840 05/01/17 0434 05/03/17 2203  LIPASE 27 118* 220*   No results for input(s): AMMONIA in the last 168 hours. CBC:  Recent Labs Lab 04/27/17 1840 05/01/17 0434 05/03/17 2203  WBC 5.6 6.2 5.4  HGB 9.7* 9.3* 9.0*  HCT 30.2* 29.3* 28.5*  MCV 86.0 88.8 90.2  PLT 182 200 191   Cardiac Enzymes: No results for input(s): CKTOTAL, CKMB, CKMBINDEX, TROPONINI in the last 168 hours.  BNP (last 3 results) No results for input(s): BNP in the last 8760 hours.  ProBNP (last 3 results) No results for input(s): PROBNP in the last 8760 hours.  CBG:  Recent Labs Lab 04/27/17 1810 04/27/17 2016 04/28/17 1156  GLUCAP 51* 462* 131*    Radiological Exams on Admission: No results found.  EKG: Independently reviewed.   Assessment/Plan Active Problems:   Anemia of chronic kidney failure   ESRD on hemodialysis (HCC)   Essential hypertension   GERD (gastroesophageal reflux disease)   Abdominal pain   51 y.o. male with PMH of HTN, CHF, DVT (on coumadin), ESRD on HD (TTS), chronic pain, presented with abdominal pains and acute pancreatitis   Mild acute pancreatitis. Unclear etiology. Ct showed- unremarkable gallbladder and biliary system.  LFTS/bili: unremarkable. Denies etoh use. CT: Small amount of peri pancreatic free fluid about the pancreas body and tail compatible with mild pancreatitis. No formed fluid collection, large pseudo cyst, or abscess.  -will cont supportive care, npo, small  amount of iv fluids for few hours while NPO, due to esrd on hd. Antiemetics, pain control. If not improved may need gi eval with EUS. Will obtain KUB today  ESRD on HD (TTS). Consulted nephrology to cont HD. Will avoid aggressive fluids. Monitor fluid status.  HTN. Uncontrolled. Will resume home regimen. Titrate as needed. Prn hydralazine  Chronic HF. Systolic HF. Clinically euvolemic on exam. Imaging: stable cardiomegaly without CHF. Will avoid aggressive iv fluids. Consulted nephrology for dialysis for volume control.   Anemia. AoCD. No s/s of bleeding. Monitor  DVT (on coumadin). Resume coumadin per pharmacy  CT abd: chronic indeterminate 4.4 cm left renal mass. Recommend outpatient follow up with mri at discharge   Nephrology;  if consultant consulted, please document name and whether formally or informally consulted  Code Status: full  (must indicate code status--if unknown or must be presumed, indicate so) Family Communication: d/w patient, ED.  (indicate person spoken with, if applicable, with phone number if by telephone) Disposition Plan: home 24-48 hrs (indicate anticipated LOS)  Time spent: >35 minutes   [name] [facility] Pager [phone] for 05/04/17  If 7PM-7AM, please contact night-coverage [email or web address] Password [email or web address] 05/04/2017, 7:55 AM

## 2017-05-04 NOTE — Progress Notes (Signed)
Pt refused assessment and to have MRSA pcr.   Eleanora Neighbor, RN

## 2017-05-04 NOTE — ED Notes (Signed)
Pt requesting a protein shake but is NPO at this time. MD paged.

## 2017-05-04 NOTE — ED Notes (Signed)
Pt refused to have vitals taken

## 2017-05-05 ENCOUNTER — Observation Stay (HOSPITAL_COMMUNITY): Payer: Medicare Other

## 2017-05-05 DIAGNOSIS — Z86711 Personal history of pulmonary embolism: Secondary | ICD-10-CM | POA: Diagnosis not present

## 2017-05-05 DIAGNOSIS — D631 Anemia in chronic kidney disease: Secondary | ICD-10-CM | POA: Diagnosis not present

## 2017-05-05 DIAGNOSIS — N2581 Secondary hyperparathyroidism of renal origin: Secondary | ICD-10-CM | POA: Diagnosis not present

## 2017-05-05 DIAGNOSIS — I132 Hypertensive heart and chronic kidney disease with heart failure and with stage 5 chronic kidney disease, or end stage renal disease: Secondary | ICD-10-CM | POA: Diagnosis present

## 2017-05-05 DIAGNOSIS — N2889 Other specified disorders of kidney and ureter: Secondary | ICD-10-CM | POA: Diagnosis present

## 2017-05-05 DIAGNOSIS — R1013 Epigastric pain: Secondary | ICD-10-CM | POA: Diagnosis not present

## 2017-05-05 DIAGNOSIS — N186 End stage renal disease: Secondary | ICD-10-CM | POA: Diagnosis not present

## 2017-05-05 DIAGNOSIS — K219 Gastro-esophageal reflux disease without esophagitis: Secondary | ICD-10-CM | POA: Diagnosis present

## 2017-05-05 DIAGNOSIS — E1122 Type 2 diabetes mellitus with diabetic chronic kidney disease: Secondary | ICD-10-CM | POA: Diagnosis present

## 2017-05-05 DIAGNOSIS — Z87891 Personal history of nicotine dependence: Secondary | ICD-10-CM | POA: Diagnosis not present

## 2017-05-05 DIAGNOSIS — K859 Acute pancreatitis without necrosis or infection, unspecified: Principal | ICD-10-CM

## 2017-05-05 DIAGNOSIS — Z7901 Long term (current) use of anticoagulants: Secondary | ICD-10-CM | POA: Diagnosis not present

## 2017-05-05 DIAGNOSIS — Z992 Dependence on renal dialysis: Secondary | ICD-10-CM | POA: Diagnosis not present

## 2017-05-05 DIAGNOSIS — Z86718 Personal history of other venous thrombosis and embolism: Secondary | ICD-10-CM | POA: Diagnosis not present

## 2017-05-05 DIAGNOSIS — Z79899 Other long term (current) drug therapy: Secondary | ICD-10-CM | POA: Diagnosis not present

## 2017-05-05 DIAGNOSIS — I5042 Chronic combined systolic (congestive) and diastolic (congestive) heart failure: Secondary | ICD-10-CM | POA: Diagnosis present

## 2017-05-05 DIAGNOSIS — I120 Hypertensive chronic kidney disease with stage 5 chronic kidney disease or end stage renal disease: Secondary | ICD-10-CM | POA: Diagnosis not present

## 2017-05-05 LAB — RENAL FUNCTION PANEL
ALBUMIN: 2.9 g/dL — AB (ref 3.5–5.0)
Albumin: 3 g/dL — ABNORMAL LOW (ref 3.5–5.0)
Anion gap: 16 — ABNORMAL HIGH (ref 5–15)
Anion gap: 11 (ref 5–15)
BUN: 84 mg/dL — ABNORMAL HIGH (ref 6–20)
BUN: 35 mg/dL — AB (ref 6–20)
CALCIUM: 8.1 mg/dL — AB (ref 8.9–10.3)
CHLORIDE: 96 mmol/L — AB (ref 101–111)
CO2: 24 mmol/L (ref 22–32)
CO2: 30 mmol/L (ref 22–32)
CREATININE: 13.32 mg/dL — AB (ref 0.61–1.24)
CREATININE: 7.52 mg/dL — AB (ref 0.61–1.24)
Calcium: 8.1 mg/dL — ABNORMAL LOW (ref 8.9–10.3)
Chloride: 96 mmol/L — ABNORMAL LOW (ref 101–111)
GFR calc non Af Amer: 4 mL/min — ABNORMAL LOW (ref >60)
GFR, EST AFRICAN AMERICAN: 4 mL/min — AB (ref >60)
GFR, EST AFRICAN AMERICAN: 9 mL/min — AB (ref >60)
GFR, EST NON AFRICAN AMERICAN: 7 mL/min — AB (ref >60)
GLUCOSE: 105 mg/dL — AB (ref 65–99)
Glucose, Bld: 150 mg/dL — ABNORMAL HIGH (ref 65–99)
Phosphorus: 5.9 mg/dL — ABNORMAL HIGH (ref 2.5–4.6)
Phosphorus: 4.1 mg/dL (ref 2.5–4.6)
Potassium: 5.5 mmol/L — ABNORMAL HIGH (ref 3.5–5.1)
Potassium: 3.8 mmol/L (ref 3.5–5.1)
SODIUM: 136 mmol/L (ref 135–145)
Sodium: 137 mmol/L (ref 135–145)

## 2017-05-05 LAB — CBC
HCT: 28.1 % — ABNORMAL LOW (ref 39.0–52.0)
HEMATOCRIT: 27.9 % — AB (ref 39.0–52.0)
Hemoglobin: 9 g/dL — ABNORMAL LOW (ref 13.0–17.0)
Hemoglobin: 8.9 g/dL — ABNORMAL LOW (ref 13.0–17.0)
MCH: 28.3 pg (ref 26.0–34.0)
MCH: 28.2 pg (ref 26.0–34.0)
MCHC: 32 g/dL (ref 30.0–36.0)
MCHC: 31.9 g/dL (ref 30.0–36.0)
MCV: 88.4 fL (ref 78.0–100.0)
MCV: 88.3 fL (ref 78.0–100.0)
PLATELETS: 211 10*3/uL (ref 150–400)
Platelets: 175 10*3/uL (ref 150–400)
RBC: 3.18 MIL/uL — ABNORMAL LOW (ref 4.22–5.81)
RBC: 3.16 MIL/uL — ABNORMAL LOW (ref 4.22–5.81)
RDW: 17.9 % — AB (ref 11.5–15.5)
RDW: 17.9 % — AB (ref 11.5–15.5)
WBC: 4.7 10*3/uL (ref 4.0–10.5)
WBC: 4.1 10*3/uL (ref 4.0–10.5)

## 2017-05-05 MED ORDER — HEPARIN SODIUM (PORCINE) 1000 UNIT/ML DIALYSIS: 1000.0000 [IU] | INTRAMUSCULAR | Status: DC | PRN

## 2017-05-05 MED ORDER — SODIUM CHLORIDE 0.9 % IV SOLN: 100.0000 mL | INTRAVENOUS | Status: DC | PRN

## 2017-05-05 MED ORDER — CALCITRIOL 0.5 MCG PO CAPS
3.7500 ug | ORAL_CAPSULE | ORAL | Status: DC
  Administered 2017-05-06: 3.75 ug via ORAL

## 2017-05-05 MED ORDER — CINACALCET HCL 30 MG PO TABS
90.0000 mg | ORAL_TABLET | Freq: Every day | ORAL | Status: DC
  Administered 2017-05-05: 90 mg via ORAL
  Filled 2017-05-05: qty 3

## 2017-05-05 MED ORDER — HEPARIN SODIUM (PORCINE) 1000 UNIT/ML DIALYSIS: 20.0000 [IU]/kg | INTRAMUSCULAR | Status: DC | PRN

## 2017-05-05 MED ORDER — PENTAFLUOROPROP-TETRAFLUOROETH EX AERO
1.0000 | INHALATION_SPRAY | CUTANEOUS | Status: DC | PRN
Start: 2017-05-05 — End: 2017-05-05

## 2017-05-05 MED ORDER — HEPARIN SODIUM (PORCINE) 1000 UNIT/ML DIALYSIS
1000.0000 [IU] | INTRAMUSCULAR | Status: DC | PRN
Start: 2017-05-05 — End: 2017-05-06

## 2017-05-05 MED ORDER — LIDOCAINE-PRILOCAINE 2.5-2.5 % EX CREA
1.0000 | TOPICAL_CREAM | CUTANEOUS | Status: DC | PRN
Start: 2017-05-05 — End: 2017-05-05

## 2017-05-05 MED ORDER — WARFARIN SODIUM 7.5 MG PO TABS
7.5000 mg | ORAL_TABLET | Freq: Once | ORAL | Status: AC
  Administered 2017-05-05: 7.5 mg via ORAL
  Filled 2017-05-05: qty 1

## 2017-05-05 MED ORDER — PENTAFLUOROPROP-TETRAFLUOROETH EX AERO: 1.0000 "application " | INHALATION_SPRAY | CUTANEOUS | Status: DC | PRN

## 2017-05-05 MED ORDER — LIDOCAINE HCL (PF) 1 % IJ SOLN: 5.0000 mL | INTRAMUSCULAR | Status: DC | PRN

## 2017-05-05 MED ORDER — SODIUM CHLORIDE 0.9 % IV SOLN
100.0000 mL | INTRAVENOUS | Status: DC | PRN
Start: 2017-05-05 — End: 2017-05-06

## 2017-05-05 MED ORDER — PROMETHAZINE HCL 25 MG/ML IJ SOLN
12.5000 mg | Freq: Once | INTRAMUSCULAR | Status: AC
  Administered 2017-05-05: 12.5 mg via INTRAVENOUS
  Filled 2017-05-05: qty 1

## 2017-05-05 MED ORDER — LIDOCAINE-PRILOCAINE 2.5-2.5 % EX CREA: 1.0000 "application " | TOPICAL_CREAM | CUTANEOUS | Status: DC | PRN

## 2017-05-05 NOTE — Progress Notes (Signed)
PROGRESS NOTE    Frank [name]  NLZ:[id number] DOB: Nov 29, 1965 DOA: 05/04/2017 PCP: Lolita Patella, MD   Outpatient Specialists:     Brief Narrative:  Frank [name] is a 51 y.o. male with PMH of HTN, CHF, DVT (on coumadin), ESRD on HD (TTS), chronic pain, presented with abdominal pains. Patient states that he reports epigastric 8/10 abdominal pains, radiating to his back for 24 hrs. He had 5-6 times of non bloody vomiting. Had had normal bowel movement today. Denies hematochezia or hematemesis. He presented with similar symptoms 3 days ago and found to have mild elevated lipase with ct findings of pancreatitis. He decided to go home with antiemetics and pain medications. He presented to day due to non resolving and worsening abdominal pains and vomiting, inability to tolerate food. He reports previous episodes of pancreatitis.  Denies alcohol use. He denies fever, chills, no acute chest pains, no shortness of breath.  -ED: lipase is mildly elevated from 118 to 220. hospitalist is asked for observation for acute pancreatitis   Review of Systems: The patient denies anorexia, fever, weight loss,, vision loss, decreased hearing, hoarseness, chest pain, syncope, dyspnea on exertion, peripheral edema, balance deficits, hemoptysis, abdominal pain, melena, hematochezia, severe indigestion/heartburn, hematuria, incontinence, genital sores, muscle weakness, suspicious skin lesions, transient blindness, difficulty walking, depression, unusual weight change, abnormal bleeding, enlarged lymph nodes, angioedema, and breast masses.    Assessment & Plan:   Active Problems:   Anemia of chronic kidney failure   ESRD on hemodialysis (HCC)   Essential hypertension   GERD (gastroesophageal reflux disease)   Abdominal pain   Mild acute pancreatitis. Unclear etiology. Ct showed- unremarkable gallbladder and biliary system. LFTS/bili: unremarkable. Denies etoh use. CT: Small amount of peri pancreatic free fluid  about the pancreas body and tail compatible with mild pancreatitis. No formed fluid collection, large pseudo cyst, or abscess.  - improved, U/S normal -advance diet as tolerated  ESRD on HD (TTS). Consulted nephrology -HD today and in the AM  HTN.  -resume home meds  Chronic HF. Systolic HF.  -  needs volume removed  Anemia. AoCD. No s/s of bleeding. Monitor   DVT (on coumadin). Resume coumadin per pharmacy  -has not been taking at home on a regular basis and always refused heparin bridge -long discussion about compliance  CT abd: chronic indeterminate 4.4 cm left renal mass. Recommend outpatient follow up with mri at discharge    DVT prophylaxis:  Fully anticoagulated   Code Status: Full Code   Family Communication:   Disposition Plan:     Consultants:   nephro    Subjective: Wants to eat  Objective: Vitals:   05/04/17 1345 05/04/17 1534 05/04/17 2103 05/05/17 0850  BP: (!) 163/112 (!) 157/119 (!) 169/114 (!) 163/95  Pulse: 73 77 (!) 48 78  Resp: _0 Temp:  (!) 97.5 F (36.4 C) 97.9 F (36.6 C)   TempSrc:  Oral    SpO2: 100% 100% 96% 100%  Weight:   77.6 kg (171 lb)   Height:        Intake/Output Summary (Last 24 hours) at 05/05/17 1302 Last data filed at 05/05/17 1100  Gross per 24 hour  Intake              390 ml  Output                0 ml  Net  390 ml   Filed Weights   05/03/17 2204 05/04/17 2103  Weight: 74.8 kg (165 lb) 77.6 kg (171 lb)    Examination:  General exam: Appears calm and comfortable  Respiratory system: Clear to auscultation. Respiratory effort normal. Cardiovascular system: S1 & S2 heard, RRR. No JVD, murmurs, rubs, gallops or clicks. No pedal edema. Gastrointestinal system: Abdomen is nondistended, soft and nontender. No organomegaly or masses felt. Normal bowel sounds heard. Central nervous system: Alert and oriented. No focal neurological deficits. Extremities: Symmetric 5 x 5 power. Skin: No  rashes, lesions or ulcers Psychiatry: Judgement and insight appear normal. Mood & affect appropriate.     Data Reviewed: I have personally reviewed following labs and imaging studies  CBC:  Recent Labs Lab 05/01/17 0434 05/03/17 2203  WBC 6.2 5.4  HGB 9.3* 9.0*  HCT 29.3* 28.5*  MCV 88.8 90.2  PLT 200 409   Basic Metabolic Panel:  Recent Labs Lab 05/01/17 0434 05/03/17 2203  NA 137 137  K 4.7 4.5  CL 99* 97*  CO2 23 25  GLUCOSE 77 133*  BUN 54* 56*  CREATININE 10.80* 10.35*  CALCIUM 8.4* 8.3*   GFR: Estimated Creatinine Clearance: 9.1 mL/min (A) (by C-G formula based on SCr of 10.35 mg/dL (H)). Liver Function Tests:  Recent Labs Lab 05/01/17 0434 05/03/17 2203  AST 24 23  ALT 18 15*  ALKPHOS 148* 168*  BILITOT 0.6 0.8  PROT 7.6 7.4  ALBUMIN 3.4* 3.2*    Recent Labs Lab 05/01/17 0434 05/03/17 2203  LIPASE 118* 220*   No results for input(s): AMMONIA in the last 168 hours. Coagulation Profile:  Recent Labs Lab 05/01/17 0434 05/04/17 0817  INR 1.27 1.13   Cardiac Enzymes: No results for input(s): CKTOTAL, CKMB, CKMBINDEX, TROPONINI in the last 168 hours. BNP (last 3 results) No results for input(s): PROBNP in the last 8760 hours. HbA1C: No results for input(s): HGBA1C in the last 72 hours. CBG: No results for input(s): GLUCAP in the last 168 hours. Lipid Profile: No results for input(s): CHOL, HDL, LDLCALC, TRIG, CHOLHDL, LDLDIRECT in the last 72 hours. Thyroid Function Tests: No results for input(s): TSH, T4TOTAL, FREET4, T3FREE, THYROIDAB in the last 72 hours. Anemia Panel: No results for input(s): VITAMINB12, FOLATE, FERRITIN, TIBC, IRON, RETICCTPCT in the last 72 hours. Urine analysis:    Component Value Date/Time   COLORURINE YELLOW 10/18/2013 0419   APPEARANCEUR CLEAR 10/18/2013 0419   LABSPEC 1.008 10/18/2013 0419   PHURINE 8.5 (H) 10/18/2013 0419   GLUCOSEU 100 (A) 10/18/2013 0419   HGBUR TRACE (A) 10/18/2013 0419    BILIRUBINUR NEGATIVE 10/18/2013 0419   KETONESUR NEGATIVE 10/18/2013 0419   PROTEINUR 100 (A) 10/18/2013 0419   UROBILINOGEN 0.2 10/18/2013 0419   NITRITE NEGATIVE 10/18/2013 0419   LEUKOCYTESUR NEGATIVE 10/18/2013 0419     )No results found for this or any previous visit (from the past 240 hour(s)).    Anti-infectives    None       Radiology Studies: Dg Abd 1 View  Result Date: 05/04/2017 CLINICAL DATA:  51 y/o  M; upper and mid abdominal pain. EXAM: ABDOMEN - 1 VIEW COMPARISON:  05/01/2017 CT abdomen and pelvis. FINDINGS: The bowel gas pattern is normal. No radio-opaque calculi or other significant radiographic abnormality are seen. Vascular calcifications in left hemipelvis related to since renal transplant. IMPRESSION: Normal bowel gas pattern. Electronically Signed   By: Kristine Garbe M.D.   On: 05/04/2017 17:26   US Abdomen Limited Ruq  Result Date: 05/05/2017 CLINICAL DATA:  Inpatient with reported history of acute pancreatitis. EXAM: ULTRASOUND ABDOMEN LIMITED RIGHT UPPER QUADRANT COMPARISON:  05/01/2017 unenhanced CT abdomen/pelvis. FINDINGS: Gallbladder: No gallstones. No gallbladder wall thickening. No significant gallbladder distention. No sonographic Murphy's sign. Small amount of pericholecystic fluid. Common bile duct: Diameter: 6 mm, top-normal Liver: No focal lesion identified. Within normal limits in parenchymal echogenicity. Portal vein is patent on color Doppler imaging with normal direction of blood flow towards the liver. Right pleural effusion noted. IMPRESSION: 1. No cholelithiasis. No sonographic findings of acute cholecystitis. 2. Nonspecific small volume pericholecystic fluid, probably secondary to pancreatitis. 3. Top-normal caliber common bile duct (6 mm diameter). 4. Normal liver. 5. Right pleural effusion. Electronically Signed   By: Ilona Sorrel M.D.   On: 05/05/2017 10:45        Scheduled Meds: . amLODipine  10 mg Oral QHS  . [START ON  05/06/2017] calcitRIOL  3.75 mcg Oral Q T,Th,Sa-HD  . carvedilol  12.5 mg Oral BID  . cinacalcet  90 mg Oral Q supper  . cloNIDine  0.3 mg Transdermal Weekly  . dicyclomine  20 mg Oral QID  . gabapentin  100 mg Oral QHS  . hydrALAZINE  100 mg Oral BID  . isosorbide mononitrate  60 mg Oral Daily  . pantoprazole  40 mg Oral BID AC  . sevelamer carbonate  2,400 mg Oral TID WC  . warfarin  7.5 mg Oral ONCE-1800  . Warfarin - Pharmacist Dosing Inpatient   Does not apply q1800   Continuous Infusions:   LOS: 0 days    Time spent: 35 min    [place], DO [facility] Pager [phone]  If 7PM-7AM, please contact night-coverage [email or web address] Password [email or web address] 05/05/2017, 1:02 PM

## 2017-05-05 NOTE — Progress Notes (Signed)
Pt refused all vitals

## 2017-05-05 NOTE — Progress Notes (Signed)
Pt requesting pain and nausea medication. Then falls back to sleep--snoring.  RN asked pt if he could take some of his meds with water. Pt stated "with apple juice".  RN informed pt he is NPO with sips of water with meds. Pt irritated and aggressive like with nurse.  Pt informed of needing an order from the doctor for other liquids besides water.   Pt not speaking to nurse after that.  Message sent to Dr Eliseo Squires.

## 2017-05-05 NOTE — Procedures (Signed)
   I was present at this dialysis session, have reviewed the session itself and made  appropriate changes Kelly Splinter MD [place] pager [phone]   05/05/2017, 4:20 PM

## 2017-05-05 NOTE — Care Management Obs Status (Signed)
[place] NOTIFICATION   Patient Details  Name: Frank [name] MRN: [id number] Date of Birth: [birth year]   Medicare Observation Status Notification Given:  Yes  Pt declined to sign, indicated that he is familiar with OBS status based on past admissions.   Kanasia Gayman, Rory Percy, RN 05/05/2017, 11:25 AM

## 2017-05-05 NOTE — Progress Notes (Signed)
Pt refused labs.  Eleanora Neighbor, RN

## 2017-05-05 NOTE — Progress Notes (Signed)
[place] for warfarin Indication: hx VTE  Allergies  Allergen Reactions  . Butalbital-Apap-Caffeine Shortness Of Breath, Swelling and Other (See Comments)    Swelling in throat  . Ferrlecit [Na Ferric Gluc Cplx In Sucrose] Shortness Of Breath, Swelling and Other (See Comments)    Swelling in throat, tolerates Venofor  . Minoxidil Shortness Of Breath  . Tylenol [Acetaminophen] Anaphylaxis and Swelling  . Darvocet [Propoxyphene N-Acetaminophen] Hives    Patient Measurements: Height: _0  (188 cm) Weight: 171 lb (77.6 kg) IBW/kg (Calculated) : 82.2  Vital Signs: BP: 163/95 (10/15 0850) Pulse Rate: 78 (10/15 0850)  Labs:  Recent Labs  05/03/17 2203 05/04/17 0817  HGB 9.0*  --   HCT 28.5*  --   PLT 191  --   LABPROT  --  14.4  INR  --  1.13  CREATININE 10.35*  --     Estimated Creatinine Clearance: 9.1 mL/min (A) (by C-G formula based on SCr of 10.35 mg/dL (H)).   Medical History: Past Medical History:  Diagnosis Date  . Anemia   . Anxiety   . Chronic combined systolic and diastolic CHF (congestive heart failure) (HCC)    a. EF 20-25% by echo in 08/2015 b. echo 10/2015: EF 35-40%, diffuse HK, severe LAE, moderate RAE, small pericardial effusion  . Complication of anesthesia    itching, sore throat  . Depression   . Dialysis patient ([place])   . DVT (deep venous thrombosis) ([place]) 02/2017  . ESRD (end stage renal disease) ([place])    due to HTN per patient, followed at [facility], s/p failed kidney transplant - dialysis Tue, Th, Sat  . Hyperkalemia 12/2015  . Hypertension   . Junctional rhythm    a. noted in 08/2015: hyperkalemic at that time  b. 12/2015: presented in junctional rhythm w/ K+ of 6.6. Resolved with improvement of K+ levels.  . Nonischemic cardiomyopathy ([place])    a. 08/2014: cath showing minimal CAD, but tortuous arteries noted.   . Personal history of DVT (deep vein thrombosis)/ PE 05/26/2016   In Oct 2015 had small  subsemental LUL PE w/o DVT (LE dopplers neg) and was felt to be HD cath related, treated w coumadin.  IN May 2016 had small vein DVT (acute/subacute) in the R basilic/ brachial veins of the RUE, resumed on coumadin.  Had R sided HD cath at that time.    . Renal insufficiency   . Shortness of breath   . Type II diabetes mellitus (HCC)    No history per patient, but remains under history as A1c would not be accurate given on dialysis   Assessment: 61 yom presented to the ED with abdominal pain. He is on chronic coumadin but has a known history of non-compliance. INR 1.13 on 10/14 and has subsequently refused lab draws this am to assess INR.   PTA dose is 7.5 mg daily   Goal of Therapy:  INR 2-3 Monitor platelets by anticoagulation protocol: Yes   Plan:  1. Warfarin 7.5 mg PO x 1 tonight 2. Daily INR  Vincenza Hews, PharmD, BCPS 05/05/2017, 10:03 AM

## 2017-05-05 NOTE — Progress Notes (Signed)
Pt refused vitals.  Eleanora Neighbor, RN

## 2017-05-05 NOTE — Consult Note (Signed)
[facility] Renal Consultation Note    Indication for Consultation:  Management of ESRD/hemodialysis; anemia, hypertension/volume and secondary hyperparathyroidism  UXL:[id number], Doralee Albino, MD  HPI: Frank [name] is a 51 y.o. male. ESRD 2/2 HTN on HD TTS at [facility], first starting in 2000.  Past medical history significant for DMT2, MCIM, GERD, Hx PE, and UE DVT both related to [facility], and [facility].  Of note patient has chronically large IDWG and is not compliant with HD. Last treatment on 10/13, completed 3 of 4 hours.   Admitted for 1 day ago for mild acute pancreatitis, with c/os of epigastric abdominal pain, nausea and vomiting for the last 3-4 days.  Pertinent findings for this admission included mildly elevated lipase of 220. Seen an examined at bedside.  Denies abdominal pain, n/v/d, SOB, CP, weakness, fever and chills.  Patient presented to ED on 10/11 with similar complaints, CT completed showed mild acute pancreatitis, lipase 118, determined to be mild acute on chronic pancreatitis with labs close to baseline and dc home.     Past Medical History:  Diagnosis Date  . Anemia   . Anxiety   . Chronic combined systolic and diastolic CHF (congestive heart failure) (HCC)    a. EF 20-25% by echo in 08/2015 b. echo 10/2015: EF 35-40%, diffuse HK, severe LAE, moderate RAE, small pericardial effusion  . Complication of anesthesia    itching, sore throat  . Depression   . Dialysis patient ([place])   . DVT (deep venous thrombosis) ([place]) 02/2017  . ESRD (end stage renal disease) ([place])    due to HTN per patient, followed at [facility], s/p failed kidney transplant - dialysis Tue, Th, Sat  . Hyperkalemia 12/2015  . Hypertension   . Junctional rhythm    a. noted in 08/2015: hyperkalemic at that time  b. 12/2015: presented in junctional rhythm w/ K+ of 6.6. Resolved with improvement of K+ levels.  . Nonischemic cardiomyopathy ([place])    a. 08/2014: cath showing minimal CAD, but tortuous  arteries noted.   . Personal history of DVT (deep vein thrombosis)/ PE 05/26/2016   In Oct 2015 had small subsemental LUL PE w/o DVT (LE dopplers neg) and was felt to be HD cath related, treated w coumadin.  IN May 2016 had small vein DVT (acute/subacute) in the R basilic/ brachial veins of the RUE, resumed on coumadin.  Had R sided HD cath at that time.    . Renal insufficiency   . Shortness of breath   . Type II diabetes mellitus (HCC)    No history per patient, but remains under history as A1c would not be accurate given on dialysis   Past Surgical History:  Procedure Laterality Date  . CAPD INSERTION    . CAPD REMOVAL    . INGUINAL HERNIA REPAIR Right 02/14/2015   Procedure: REPAIR INCARCERATED RIGHT INGUINAL HERNIA;  Surgeon: [name], MD;  Location: [facility];  Service: General;  Laterality: Right;  . INSERTION OF DIALYSIS CATHETER Right 09/23/2015   Procedure: exchange of Right internal Dialysis Catheter.;  Surgeon: [name], MD;  Location: [facility];  Service: Vascular;  Laterality: Right;  . IR GENERIC HISTORICAL  07/16/2016   IR US GUIDE VASC ACCESS LEFT 07/16/2016 Corrie Mckusick, DO [facility]-INTERV RAD  . IR GENERIC HISTORICAL Left 07/16/2016   IR THROMBECTOMY AV FISTULA W/THROMBOLYSIS/PTA INC/SHUNT/IMG LEFT 07/16/2016 Corrie Mckusick, DO [facility]-INTER[facility] RAD  . KIDNEY RECEIPIENT  2006   failed and started HD in March 2014  .  LEFT HEART CATHETERIZATION WITH CORONARY ANGIOGRAM N/A 09/02/2014   Procedure: LEFT HEART CATHETERIZATION WITH CORONARY ANGIOGRAM;  Surgeon: [name], MD;  Location: [facility] CATH LAB;  Service: Cardiovascular;  Laterality: N/A;   Family History  Problem Relation Age of Onset  . Hypertension Other    Social History:  reports that he has quit smoking. His smoking use included Cigarettes. He smoked 0.00 packs per day for 1.00 year. He has never used smokeless tobacco. He reports that he uses drugs, including Marijuana. He reports that he does not drink alcohol. Allergies   Allergen Reactions  . Butalbital-Apap-Caffeine Shortness Of Breath, Swelling and Other (See Comments)    Swelling in throat  . Ferrlecit [Na Ferric Gluc Cplx In Sucrose] Shortness Of Breath, Swelling and Other (See Comments)    Swelling in throat, tolerates Venofor  . Minoxidil Shortness Of Breath  . Tylenol [Acetaminophen] Anaphylaxis and Swelling  . Darvocet [Propoxyphene N-Acetaminophen] Hives   Prior to Admission medications   Medication Sig Start Date End Date Taking? Authorizing Provider  amLODipine (NORVASC) 10 MG tablet Take 1 tablet (10 mg total) by mouth at bedtime. 12/01/16   Rice, Resa Miner, MD  carvedilol (COREG) 6.25 MG tablet Take 12.5 mg by mouth 2 (two) times daily. 04/03/17   [provider]  cloNIDine (CATAPRES - DOSED IN MG/24 HR) 0.3 mg/24hr patch Place 0.3 mg onto the skin once a week. On Wednesday  12/31/16   [provider]  dicyclomine (BENTYL) 20 MG tablet Take 20 mg by mouth every 6 (six) hours. 04/26/17   [provider]  gabapentin (NEURONTIN) 100 MG capsule Take 100 mg by mouth at bedtime. 03/20/17   [provider]  hydrALAZINE (APRESOLINE) 100 MG tablet Take 1 tablet (100 mg total) by mouth 2 (two) times daily. 01/07/17   Ledell Noss, MD  isosorbide mononitrate (IMDUR) 60 MG 24 hr tablet Take 1 tablet (60 mg total) by mouth daily. 01/08/17   Ledell Noss, MD  lisinopril (PRINIVIL,ZESTRIL) 20 MG tablet Take 20 mg by mouth daily. 03/20/17   [provider]  metoCLOPramide (REGLAN) 5 MG tablet Take 1 tablet (5 mg total) by mouth every 8 (eight) hours as needed for nausea or vomiting. 05/01/17   Duffy Bruce, MD  ondansetron (ZOFRAN ODT) 8 MG disintegrating tablet 82m ODT q4 hours prn nausea Patient taking differently: Take 8 mg by mouth every 4 (four) hours as needed for nausea.  04/22/17   VGeradine Girt DO  oxyCODONE (ROXICODONE) 5 MG immediate release tablet Take 1 tablet (5 mg total) by mouth every 4 (four) hours as  needed for severe pain. 05/01/17   IDuffy Bruce MD  pantoprazole (PROTONIX) 40 MG tablet Take 1 tablet (40 mg total) by mouth 2 (two) times daily before a meal. 12/01/16   Rice, CResa Miner MD  promethazine (PHENERGAN) 25 MG tablet Take 25 mg by mouth every 8 (eight) hours as needed. 04/27/17   [provider]  Respiratory Therapy Supplies MISC 1 each daily. CPAP MACHINE    [provider]  sevelamer carbonate (RENVELA) 800 MG tablet Take 3 tablets (2,400 mg total) by mouth 3 (three) times daily with meals. 04/22/17   VGeradine Girt DO  traMADol (ULTRAM) 50 MG tablet Take 1 tablet (50 mg total) by mouth every 12 (twelve) hours as needed for moderate pain or severe pain. 12/01/16   RCollier Salina MD  warfarin (COUMADIN) 5 MG tablet Take 1.5 tablets (7.5 mg total) by mouth  daily. 04/22/17   [name], DO   Current Facility-Administered Medications  Medication Dose Route Frequency Provider Last Rate Last Dose  . amLODipine (NORVASC) tablet 10 mg  10 mg Oral QHS [name], MD      . Derrill Memo ON 05/06/2017] calcitRIOL (ROCALTROL) capsule 3.75 mcg  3.75 mcg Oral Q T,Th,Sa-HD Penninger, Lindsay, PA      . carvedilol (COREG) tablet 12.5 mg  12.5 mg Oral BID [name], MD   12.5 mg at 05/04/17 1045  . cloNIDine (CATAPRES - Dosed in mg/24 hr) patch 0.3 mg  0.3 mg Transdermal Weekly [name], MD   0.3 mg at 05/04/17 1050  . dicyclomine (BENTYL) tablet 20 mg  20 mg Oral QID [name], MD   20 mg at 05/04/17 2156  . gabapentin (NEURONTIN) capsule 100 mg  100 mg Oral QHS [name], MD   100 mg at 05/04/17 2156  . hydrALAZINE (APRESOLINE) tablet 100 mg  100 mg Oral BID [name], MD   100 mg at 05/04/17 2156  . isosorbide mononitrate (IMDUR) 24 hr tablet 60 mg  60 mg Oral Daily [name], MD   60 mg at 05/04/17 1045  . morphine 4 MG/ML injection 1 mg  1 mg Intravenous Q3H PRN [name], MD   1 mg at 05/04/17 2157  .  ondansetron (ZOFRAN) injection 4 mg  4 mg Intravenous Q6H PRN Buriev, Arie Sabina, MD      . oxyCODONE (Oxy IR/ROXICODONE) immediate release tablet 5 mg  5 mg Oral Q4H PRN [name], MD   5 mg at 05/04/17 1406  . pantoprazole (PROTONIX) EC tablet 40 mg  40 mg Oral BID AC Buriev, Arie Sabina, MD   40 mg at 05/04/17 1813  . sevelamer carbonate (RENVELA) tablet 2,400 mg  2,400 mg Oral TID WC Buriev, Ulugbek N, MD      . warfarin (COUMADIN) tablet 7.5 mg  7.5 mg Oral ONCE-1800 Vann, Jessica U, DO      . Warfarin - Pharmacist Dosing Inpatient   Does not apply q1800 Para [facility]       Labs: Basic Metabolic Panel:  Recent Labs Lab 05/01/17 0434 05/03/17 2203  NA 137 137  K 4.7 4.5  CL 99* 97*  CO2 23 25  GLUCOSE 77 133*  BUN 54* 56*  CREATININE 10.80* 10.35*  CALCIUM 8.4* 8.3*   Liver Function Tests:  Recent Labs Lab 05/01/17 0434 05/03/17 2203  AST 24 23  ALT 18 15*  ALKPHOS 148* 168*  BILITOT 0.6 0.8  PROT 7.6 7.4  ALBUMIN 3.4* 3.2*    Recent Labs Lab 05/01/17 0434 05/03/17 2203  LIPASE 118* 220*   CBC:  Recent Labs Lab 05/01/17 0434 05/03/17 2203  WBC 6.2 5.4  HGB 9.3* 9.0*  HCT 29.3* 28.5*  MCV 88.8 90.2  PLT 200 191   CBG:  Recent Labs Lab 04/28/17 1156  GLUCAP 131*  Studies/Results: Dg Abd 1 View  Result Date: 05/04/2017 CLINICAL DATA:  51 y/o  M; upper and mid abdominal pain. EXAM: ABDOMEN - 1 VIEW COMPARISON:  05/01/2017 CT abdomen and pelvis. FINDINGS: The bowel gas pattern is normal. No radio-opaque calculi or other significant radiographic abnormality are seen. Vascular calcifications in left hemipelvis related to since renal transplant. IMPRESSION: Normal bowel gas pattern. Electronically Signed   By: Kristine Garbe M.D.   On: 05/04/2017 17:26   US Abdomen Limited Ruq  Result Date: 05/05/2017 CLINICAL DATA:  Inpatient with reported history of acute pancreatitis. EXAM: ULTRASOUND ABDOMEN LIMITED RIGHT UPPER QUADRANT  COMPARISON:  05/01/2017 unenhanced CT abdomen/pelvis. FINDINGS: Gallbladder: No gallstones. No gallbladder wall thickening. No significant gallbladder distention. No sonographic Murphy's sign. Small amount of pericholecystic fluid. Common bile duct: Diameter: 6 mm, top-normal Liver: No focal lesion identified. Within normal limits in parenchymal echogenicity. Portal vein is patent on color Doppler imaging with normal direction of blood flow towards the liver. Right pleural effusion noted. IMPRESSION: 1. No cholelithiasis. No sonographic findings of acute cholecystitis. 2. Nonspecific small volume pericholecystic fluid, probably secondary to pancreatitis. 3. Top-normal caliber common bile duct (6 mm diameter). 4. Normal liver. 5. Right pleural effusion. Electronically Signed   By: Ilona Sorrel M.D.   On: 05/05/2017 10:45    ROS: All others negative except those listed in HPI.  Physical Exam: Vitals:   05/04/17 1345 05/04/17 1534 05/04/17 2103 05/05/17 0850  BP: (!) 163/112 (!) 157/119 (!) 169/114 (!) 163/95  Pulse: 73 77 (!) 48 78  Resp: _0 Temp:  (!) 97.5 F (36.4 C) 97.9 F (36.6 C)   TempSrc:  Oral    SpO2: 100% 100% 96% 100%  Weight:   77.6 kg (171 lb)   Height:         General: WDWN, NAD, thin BM Head: NCAT, sclera not icteric, MMM Neck: Supple. No lymphadenopathy Lungs: CTA bilaterally. No wheeze, rales or rhonchi. Breathing is unlabored. Heart: RRR. No murmur, rubs or gallops.  Abdomen: soft, +tenderness in epigastrium, +BS, no guarding, no rebound tenderness  Lower extremities:no edema, ischemic changes, or open wounds  Neuro: A&Ox3. Moves all extremities spontaneously. Psych:  Responds to questions appropriately with a normal affect. Dialysis Access: LU AVF + bruit, thrill  Dialysis Orders:  TTS - [facility]   4hrs, BFR 400, DFR Autoflow 2.0,  EDW 71kg, 2K/ 2.25Ca,   Access: LU AVF  Heparin 2000units IV Calcitriol 3.64mg PO qhd mircera 1527mIV q2wks  last 10/9  Last Labs: 10/4: Hgb 9.1; 9/22: PTH 2051,9/6: TSAT 56%, K 5.7, Ca 8.5, P 6.2,  Alb 3.8  Assessment/Plan: 1.  Mild Acute Pancreatitis - pain improving, no n/v today. Refused labs this am, will attempt to get labs with HD. CT 10/11 showed small amount of fluid around pancreas, [place] showed small amount of pericholecystic fluid likely 2/2 pancreatitis, no acute cholecystitis. Per primary.   2.  ESRD -  TTS patient.  Orders written for extra HD today to remove excess fluid. Will resume regular schedule tomorrow if patient still admitted.  3.  Hypertension/volume  - BP elevated. Cont home meds. 6kg over EDW, extra HD today, net fluid goal of 4L.  4.  Anemia  - Hgb 9.0, stable, ESA dosed on 10/9 5.  Secondary Hyperparathyroidism -  Ca and P in goal. Outpatient PTH elevated, continue VDRA and Sensipar. 6.  Nutrition - Alb 3.2. NPO for now, renal/carb modified diet when advanced.  7. DMT2 - per primary 8. Hx RUE DVT (dx 03/11/17 s/p admit for anticoagualtion, left AMA) - on warfarin, continues to have low INR (likely not taking) per pharmacy/primary.      LiJen MowPA-C [facility] Pager: [phone]/15/2018, 10:49 AM   Pt seen, examined and agree w A/P as above. ESRD pt with abd pain and mild pancreatitis per CT/ lipase.  For extra HD today and reg HD tomorrow if still here.   RoKelly SplinterD  [facility] pager [phone]   05/05/2017, 12:28 PM

## 2017-05-06 LAB — CBC
HEMATOCRIT: 27.4 % — AB (ref 39.0–52.0)
Hemoglobin: 8.7 g/dL — ABNORMAL LOW (ref 13.0–17.0)
MCH: 28.3 pg (ref 26.0–34.0)
MCHC: 31.8 g/dL (ref 30.0–36.0)
MCV: 89.3 fL (ref 78.0–100.0)
PLATELETS: 175 10*3/uL (ref 150–400)
RBC: 3.07 MIL/uL — ABNORMAL LOW (ref 4.22–5.81)
RDW: 17.8 % — AB (ref 11.5–15.5)
WBC: 4.2 10*3/uL (ref 4.0–10.5)

## 2017-05-06 LAB — RENAL FUNCTION PANEL
Albumin: 2.7 g/dL — ABNORMAL LOW (ref 3.5–5.0)
Anion gap: 11 (ref 5–15)
BUN: 43 mg/dL — AB (ref 6–20)
CHLORIDE: 95 mmol/L — AB (ref 101–111)
CO2: 29 mmol/L (ref 22–32)
CREATININE: 9.17 mg/dL — AB (ref 0.61–1.24)
Calcium: 7.5 mg/dL — ABNORMAL LOW (ref 8.9–10.3)
GFR calc Af Amer: 7 mL/min — ABNORMAL LOW (ref >60)
GFR calc non Af Amer: 6 mL/min — ABNORMAL LOW (ref >60)
GLUCOSE: 114 mg/dL — AB (ref 65–99)
POTASSIUM: 4.7 mmol/L (ref 3.5–5.1)
Phosphorus: 5 mg/dL — ABNORMAL HIGH (ref 2.5–4.6)
Sodium: 135 mmol/L (ref 135–145)

## 2017-05-06 IMAGING — US US SCROTUM
1 series · 14 of 25 positions shown · non-contrast
Comparison: 11/05/2014

CLINICAL DATA: 1-2 days of bilateral testicular pain

EXAM:
SCROTAL ULTRASOUND
DOPPLER ULTRASOUND OF THE TESTICLES
TECHNIQUE: Complete ultrasound examination of the testicles, epididymis, and
other scrotal structures was performed. Color and spectral Doppler
ultrasound were also utilized to evaluate blood flow to the
testicles.

[Series 1: us scrotum · 0.06mm/px · 14 of 54 slices shown]
[im 1/54]
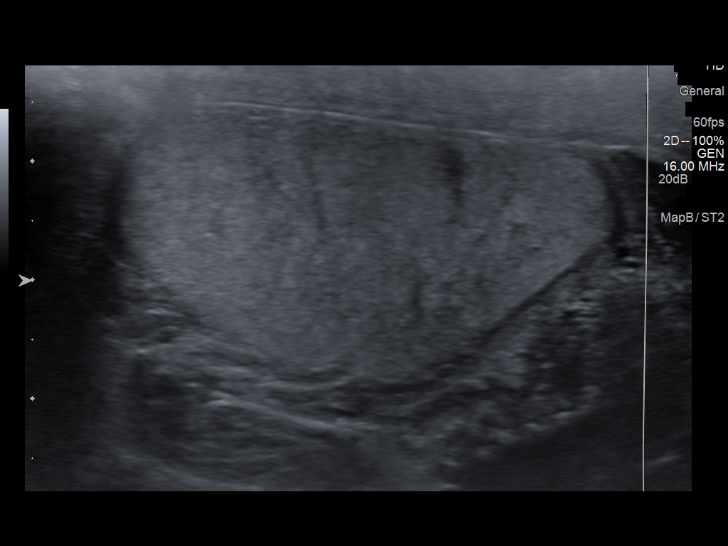
[im 5/54]
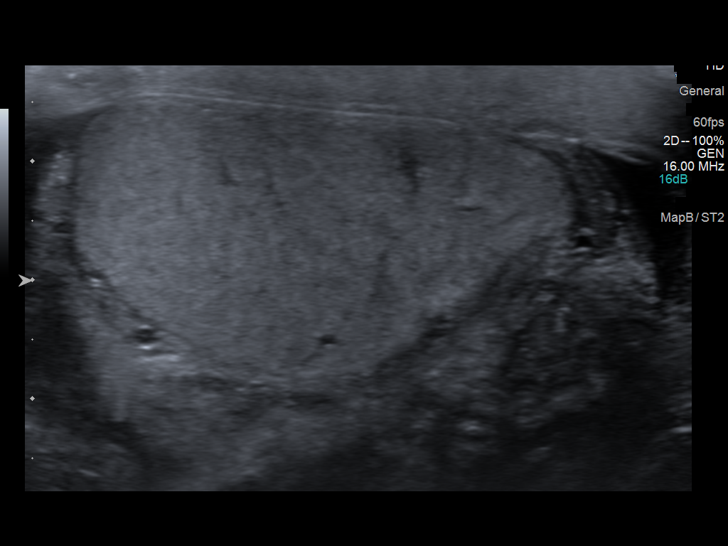
[im 9/54]
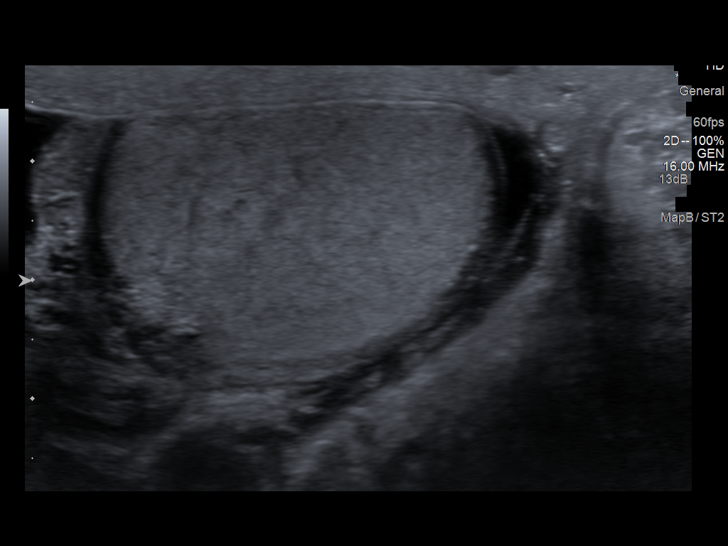
[im 14/54]
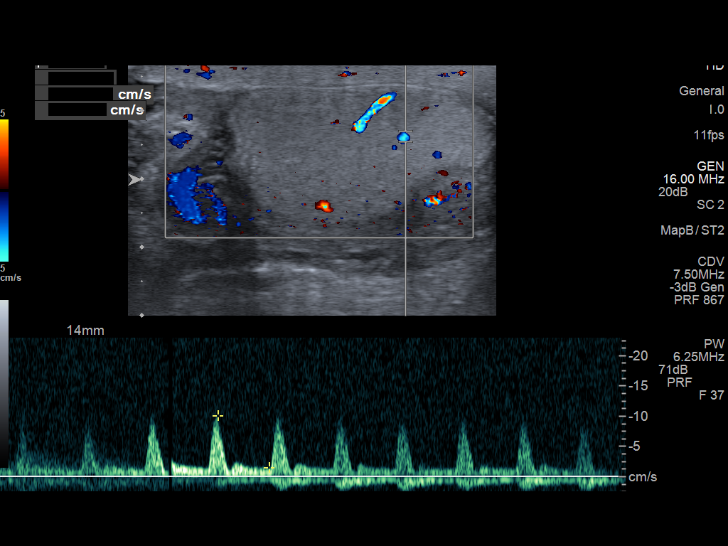
[im 18/54]
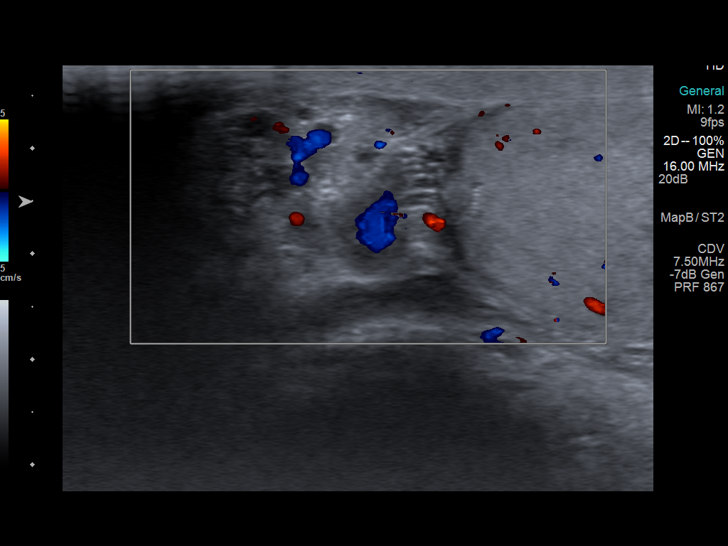
[im 20/54]
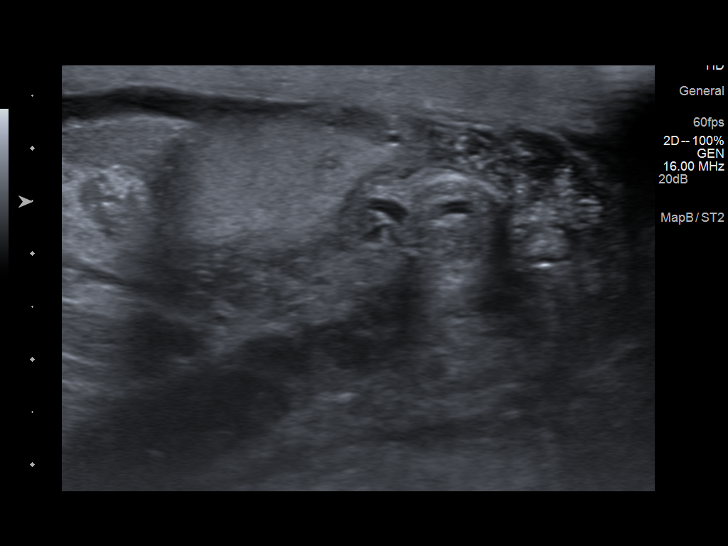
[im 25/54]
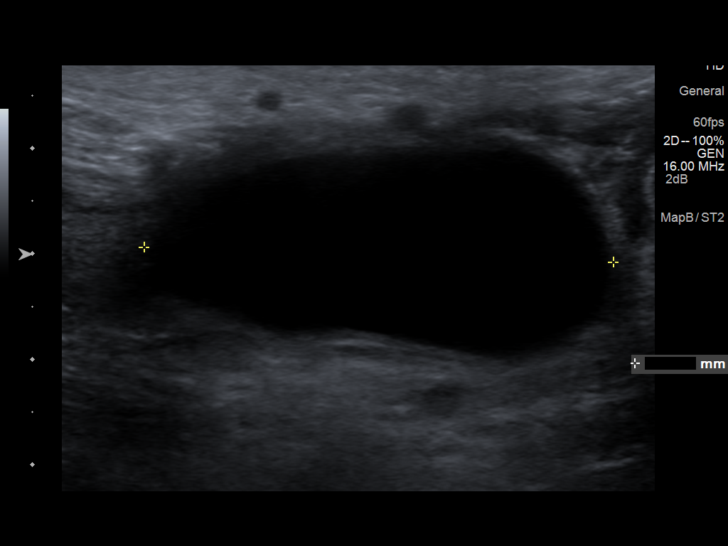
[im 29/54]
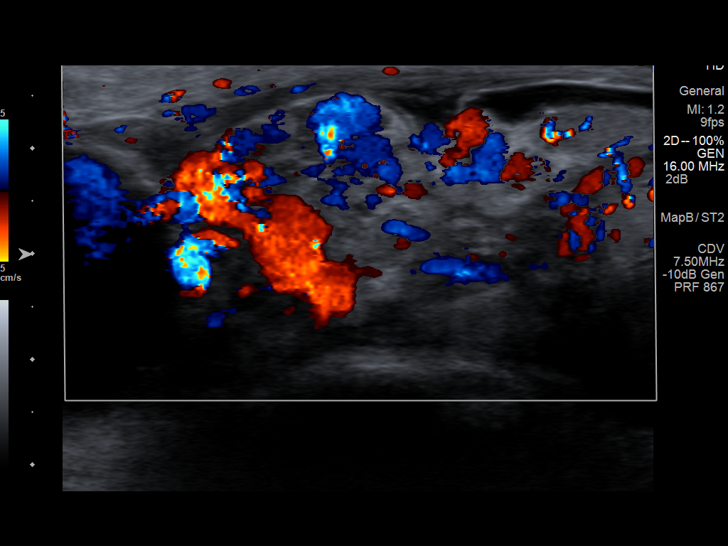
[im 34/54]
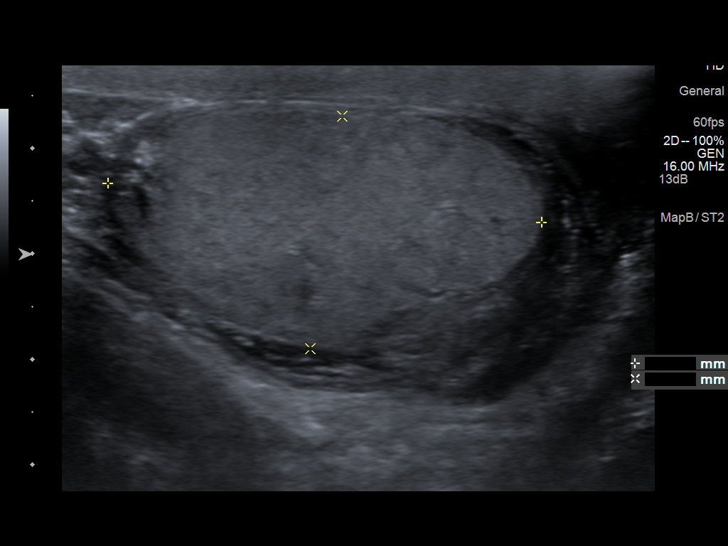
[im 36/54]
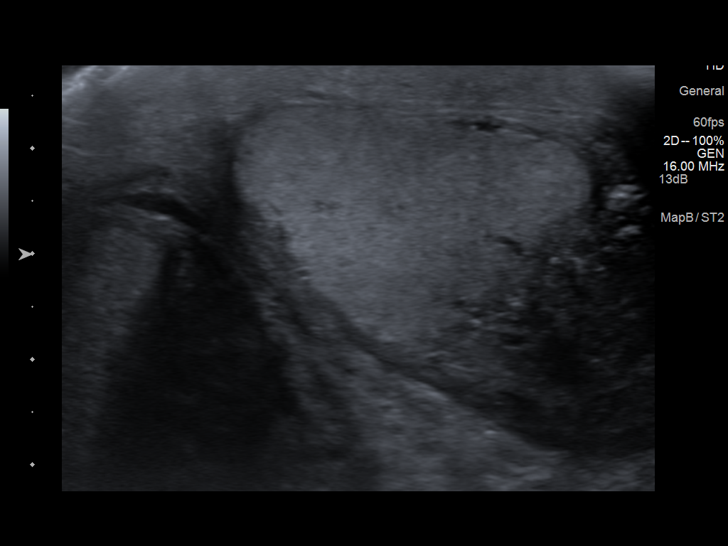
[im 40/54]
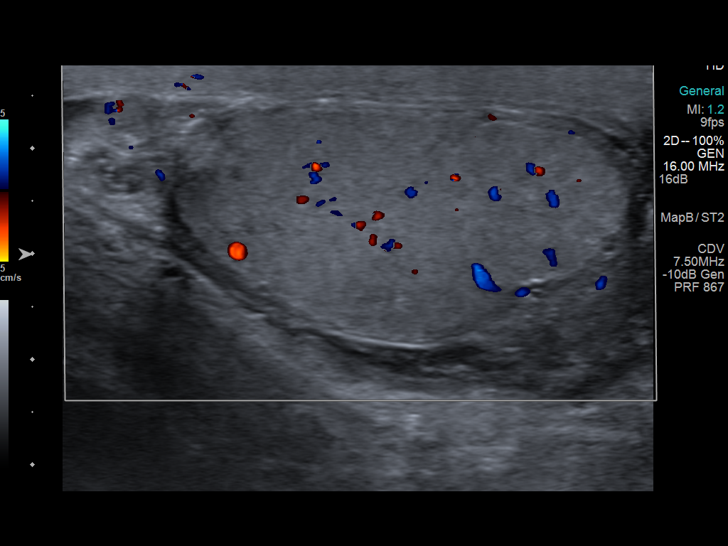
[im 45/54]
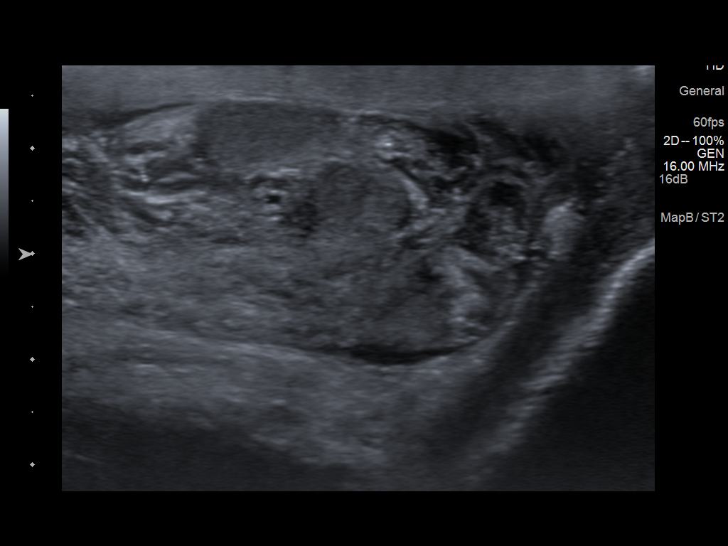
[im 49/54]
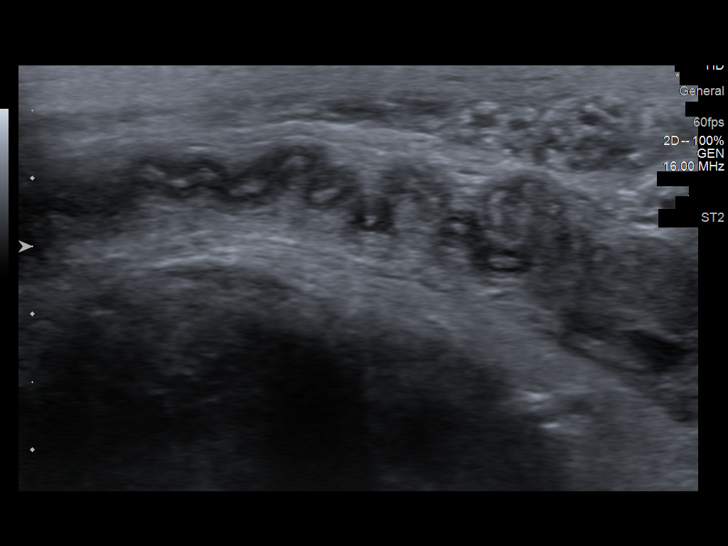
[im 54/54]
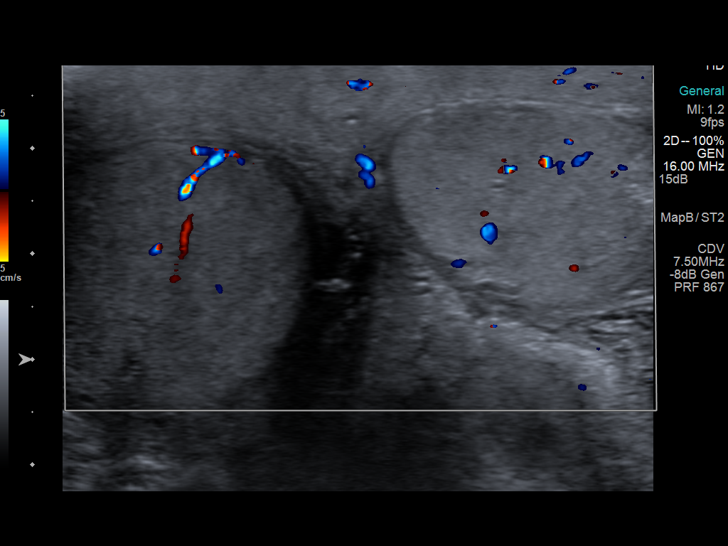

[14 of 25 positions shown; findings below may reference images not displayed]

FINDINGS: Right testicle

Measurements: 4.2 x 2.2 x 3.3 cm. No mass or microlithiasis
visualized.

Left testicle

Measurements: 4.1 x 2.2 x 3.4 cm. No mass or microlithiasis
visualized.

Right epididymis:  Enlarged with increased blood flow

Left epididymis:  Enlarged with increased blood flow

Hydrocele:  None visualized.

Varicocele:  Bilateral varicoceles.

Pulsed Doppler interrogation of both testes demonstrates normal low
resistance arterial and venous waveforms bilaterally.

Other: There is an well-circumscribed, anechoic structure within the
right inguinal canal measuring 3.7 x 2.0 x 4.5 cm.
IMPRESSION: 1. No evidence for testicular mass or torsion.
2. Bilateral epididymitis.
3. Bilateral varicoceles
4. Right inguinal canal cyst.

## 2017-05-06 MED ORDER — CALCITRIOL 0.5 MCG PO CAPS
ORAL_CAPSULE | ORAL | Status: AC
  Filled 2017-05-06: qty 6

## 2017-05-06 MED ORDER — CALCITRIOL 0.25 MCG PO CAPS
ORAL_CAPSULE | ORAL | Status: AC
  Filled 2017-05-06: qty 1

## 2017-05-06 MED ORDER — WARFARIN SODIUM 7.5 MG PO TABS
7.5000 mg | ORAL_TABLET | Freq: Once | ORAL | Status: DC
  Filled 2017-05-06: qty 1

## 2017-05-06 NOTE — Progress Notes (Signed)
[facility] Progress Note  Subjective: breathing much better, "i will be 100% after this dialysis today"  Vitals:   05/06/17 0930 05/06/17 1000 05/06/17 1030 05/06/17 1041  BP: (!) 162/82 (!) 151/91 (!) 161/95 (!) 148/100  Pulse: 73 68 73 73  Resp:    18  Temp:    98.9 F (37.2 C)  TempSrc:    Oral  SpO2:      Weight:    72.1 kg (158 lb 15.2 oz)  Height:        Inpatient medications: . amLODipine  10 mg Oral QHS  . calcitRIOL      . calcitRIOL      . calcitRIOL  3.75 mcg Oral Q T,Th,Sa-HD  . carvedilol  12.5 mg Oral BID  . cinacalcet  90 mg Oral Q supper  . cloNIDine  0.3 mg Transdermal Weekly  . dicyclomine  20 mg Oral QID  . gabapentin  100 mg Oral QHS  . hydrALAZINE  100 mg Oral BID  . isosorbide mononitrate  60 mg Oral Daily  . pantoprazole  40 mg Oral BID AC  . sevelamer carbonate  2,400 mg Oral TID WC  . warfarin  7.5 mg Oral ONCE-1800  . Warfarin - Pharmacist Dosing Inpatient   Does not apply q1800    morphine injection, ondansetron (ZOFRAN) IV, oxyCODONE  Exam: Alert, no distress No jvd Chest cta bilat RRR no mrg Abd soft ntnd no mass  Ext no edema LUA AVF patent NF , Ox 3  Dialysis: TTS [facility]/ [facility] 4h  71kg  2/2.25 Hep 2000  LUA AVF -calc 3.75 ug tiw -mircera 150 every 2, last 10/9      Impression: 1. Acute pancreatitis/ abd pain - mild case, some CT changes around the pancreatic head 2. ESRD HD TTS 3. HTN/ vol - much better, up 1-2kg today 4. Anemia - Hb 9 5. MBD - cont meds 6. DM2 7. Hx RUE DVT (Aug '18) - on coumadin, but INR"s are low, likely not taking  Plan - HD today on schedule   Kelly Splinter MD [place] pager [phone]   05/06/2017, 11:22 AM    Recent Labs Lab 05/05/17 1358 05/05/17 2015 05/06/17 0715  NA 136 137 135  K 5.5* 3.8 4.7  CL 96* 96* 95*  CO2 _0 GLUCOSE 105* 150* 114*  BUN 84* 35* 43*  CREATININE 13.32* 7.52* 9.17*  CALCIUM 8.1* 8.1* 7.5*  PHOS 5.9* 4.1 5.0*     Recent Labs Lab 05/01/17 0434 05/03/17 2203 05/05/17 1358 05/05/17 2015 05/06/17 0715  AST 24 23  --   --   --   ALT 18 15*  --   --   --   ALKPHOS 148* 168*  --   --   --   BILITOT 0.6 0.8  --   --   --   PROT 7.6 7.4  --   --   --   ALBUMIN 3.4* 3.2* 3.0* 2.9* 2.7*    Recent Labs Lab 05/05/17 1358 05/05/17 2015 05/06/17 0715  WBC 4.7 4.1 4.2  HGB 9.0* 8.9* 8.7*  HCT 28.1* 27.9* 27.4*  MCV 88.4 88.3 89.3  PLT 211 175 175   Iron/TIBC/Ferritin/ %Sat    Component Value Date/Time   IRON 41 (L) 05/29/2016 1350   TIBC 190 (L) 05/29/2016 1350   FERRITIN 328 11/02/2015 0346   IRONPCTSAT 22 05/29/2016 1350

## 2017-05-06 NOTE — Discharge Instructions (Signed)
Information on my medicine - Coumadin   (Warfarin)   What test will check on my response to Coumadin? While on Coumadin (warfarin) you will need to have an INR test regularly to ensure that your dose is keeping you in the desired range. The INR (international normalized ratio) number is calculated from the result of the laboratory test called prothrombin time (PT).  If an INR APPOINTMENT HAS NOT ALREADY BEEN MADE FOR YOU please schedule an appointment to have this lab work done by your health care provider within 7 days. Your INR goal is usually a number between:  2 to 3 or your provider may give you a more narrow range like 2-2.5.  Ask your health care provider during an office visit what your goal INR is.  What  do you need to  know  About  COUMADIN? Take Coumadin (warfarin) exactly as prescribed by your healthcare provider about the same time each day.  DO NOT stop taking without talking to the doctor who prescribed the medication.  Stopping without other blood clot prevention medication to take the place of Coumadin may increase your risk of developing a new clot or stroke.  Get refills before you run out.  What do you do if you miss a dose? If you miss a dose, take it as soon as you remember on the same day then continue your regularly scheduled regimen the next day.  Do not take two doses of Coumadin at the same time.  Important Safety Information A possible side effect of Coumadin (Warfarin) is an increased risk of bleeding. You should call your healthcare provider right away if you experience any of the following: ? Bleeding from an injury or your nose that does not stop. ? Unusual colored urine (red or dark brown) or unusual colored stools (red or black). ? Unusual bruising for unknown reasons. ? A serious fall or if you hit your head (even if there is no bleeding).  Some foods or medicines interact with Coumadin (warfarin) and might alter your response to warfarin. To help avoid  this: ? Eat a balanced diet, maintaining a consistent amount of Vitamin K. ? Notify your provider about major diet changes you plan to make. ? Avoid alcohol or limit your intake to 1 drink for women and 2 drinks for men per day. (1 drink is 5 oz. wine, 12 oz. beer, or 1.5 oz. liquor.)  Make sure that ANY health care provider who prescribes medication for you knows that you are taking Coumadin (warfarin).  Also make sure the healthcare provider who is monitoring your Coumadin knows when you have started a new medication including herbals and non-prescription products.  Coumadin (Warfarin)  Major Drug Interactions  Increased Warfarin Effect Decreased Warfarin Effect  Alcohol (large quantities) Antibiotics (esp. Septra/Bactrim, Flagyl, Cipro) Amiodarone (Cordarone) Aspirin (ASA) Cimetidine (Tagamet) Megestrol (Megace) NSAIDs (ibuprofen, naproxen, etc.) Piroxicam (Feldene) Propafenone (Rythmol SR) Propranolol (Inderal) Isoniazid (INH) Posaconazole (Noxafil) Barbiturates (Phenobarbital) Carbamazepine (Tegretol) Chlordiazepoxide (Librium) Cholestyramine (Questran) Griseofulvin Oral Contraceptives Rifampin Sucralfate (Carafate) Vitamin K   Coumadin (Warfarin) Major Herbal Interactions  Increased Warfarin Effect Decreased Warfarin Effect  Garlic Ginseng Ginkgo biloba Coenzyme Q10 Green tea St. Johns wort    Coumadin (Warfarin) FOOD Interactions  Eat a consistent number of servings per week of foods HIGH in Vitamin K (1 serving =  cup)  Collards (cooked, or boiled & drained) Kale (cooked, or boiled & drained) Mustard greens (cooked, or boiled & drained) Parsley *serving size only =  cup  Spinach (cooked, or boiled & drained) Swiss chard (cooked, or boiled & drained) Turnip greens (cooked, or boiled & drained)  Eat a consistent number of servings per week of foods MEDIUM-HIGH in Vitamin K (1 serving = 1 cup)  Asparagus (cooked, or boiled & drained) Broccoli (cooked,  boiled & drained, or raw & chopped) Brussel sprouts (cooked, or boiled & drained) *serving size only =  cup Lettuce, raw (green leaf, endive, romaine) Spinach, raw Turnip greens, raw & chopped   These websites have more information on Coumadin (warfarin):  FailFactory.se; VeganReport.com.au;

## 2017-05-06 NOTE — Progress Notes (Signed)
Patient for discharge home with no apparent distress noted. Medications and discharge instructions papers given to the patient. He doesn't want me to read and explain to him. He said he will just read it by himself. IV saline lock removed.

## 2017-05-06 NOTE — Progress Notes (Signed)
[place] for warfarin Indication: hx VTE  Allergies  Allergen Reactions  . Butalbital-Apap-Caffeine Shortness Of Breath, Swelling and Other (See Comments)    Swelling in throat  . Ferrlecit [Na Ferric Gluc Cplx In Sucrose] Shortness Of Breath, Swelling and Other (See Comments)    Swelling in throat, tolerates Venofor  . Minoxidil Shortness Of Breath  . Tylenol [Acetaminophen] Anaphylaxis and Swelling  . Darvocet [Propoxyphene N-Acetaminophen] Hives    Patient Measurements: Height: _0  (188 cm) Weight: 162 lb 11.2 oz (73.8 kg) IBW/kg (Calculated) : 82.2  Vital Signs: Temp: 97.7 F (36.5 C) (10/16 0659) Temp Source: Oral (10/16 0659) BP: 151/91 (10/16 1000) Pulse Rate: 68 (10/16 1000)  Labs:  Recent Labs  05/04/17 0817 05/05/17 1358 05/05/17 2015 05/06/17 0715  HGB  --  9.0* 8.9* 8.7*  HCT  --  28.1* 27.9* 27.4*  PLT  --  211 175 175  LABPROT 14.4  --   --   --   INR 1.13  --   --   --   CREATININE  --  13.32* 7.52* 9.17*    Estimated Creatinine Clearance: 9.7 mL/min (A) (by C-G formula based on SCr of 9.17 mg/dL (H)).   Medical History: Past Medical History:  Diagnosis Date  . Anemia   . Anxiety   . Chronic combined systolic and diastolic CHF (congestive heart failure) (HCC)    a. EF 20-25% by echo in 08/2015 b. echo 10/2015: EF 35-40%, diffuse HK, severe LAE, moderate RAE, small pericardial effusion  . Complication of anesthesia    itching, sore throat  . Depression   . Dialysis patient ([place])   . DVT (deep venous thrombosis) ([place]) 02/2017  . ESRD (end stage renal disease) ([place])    due to HTN per patient, followed at [facility], s/p failed kidney transplant - dialysis Tue, Th, Sat  . Hyperkalemia 12/2015  . Hypertension   . Junctional rhythm    a. noted in 08/2015: hyperkalemic at that time  b. 12/2015: presented in junctional rhythm w/ K+ of 6.6. Resolved with improvement of K+ levels.  . Nonischemic cardiomyopathy  ([place])    a. 08/2014: cath showing minimal CAD, but tortuous arteries noted.   . Personal history of DVT (deep vein thrombosis)/ PE 05/26/2016   In Oct 2015 had small subsemental LUL PE w/o DVT (LE dopplers neg) and was felt to be HD cath related, treated w coumadin.  IN May 2016 had small vein DVT (acute/subacute) in the R basilic/ brachial veins of the RUE, resumed on coumadin.  Had R sided HD cath at that time.    . Renal insufficiency   . Shortness of breath   . Type II diabetes mellitus (HCC)    No history per patient, but remains under history as A1c would not be accurate given on dialysis   Assessment: 41 yom presented to the ED with abdominal pain. He is on chronic coumadin but has a known history of non-compliance. INR 1.13 on 10/14. Has intermittently refused labs but when labs have been obtained INR has unfortunately been omitted despite standing order. CBC has remained stable.   PTA dose is 7.5 mg daily   Goal of Therapy:  INR 2-3 Monitor platelets by anticoagulation protocol: Yes   Plan:  1. Warfarin 7.5 mg PO x 1 tonight ( home dose) 2. Daily INR  Vincenza Hews, PharmD, BCPS 05/06/2017, 10:28 AM

## 2017-05-06 NOTE — Discharge Summary (Signed)
Physician Discharge Summary  Frank [name] KPT:[id number] DOB: 1965/08/20 DOA: 05/04/2017  PCP: Lolita Patella, MD  Admit date: 05/04/2017 Discharge date: 05/06/2017   Recommendations for Outpatient Follow-Up:   Patient refusing coumadin for DVT  Discharge Diagnosis:   Active Problems:   Anemia of chronic kidney failure   ESRD on hemodialysis [facility])   Essential hypertension   GERD (gastroesophageal reflux disease)   Abdominal pain   Discharge disposition:  Home  Discharge Condition: Improved.  Diet recommendation: renal  Wound care: None.   History of Present Illness:   Frank [name] is a 51 y.o. male with PMH of HTN, CHF, DVT (on coumadin), ESRD on HD (TTS), chronic pain, presented with abdominal pains. Patient states that he reports epigastric 8/10 abdominal pains, radiating to his back for 24 hrs. He had 5-6 times of non bloody vomiting. Had had normal bowel movement today. Denies hematochezia or hematemesis. He presented with similar symptoms 3 days ago and found to have mild elevated lipase with ct findings of pancreatitis. He decided to go home with antiemetics and pain medications. He presented to day due to non resolving and worsening abdominal pains and vomiting, inability to tolerate food. He reports previous episodes of pancreatitis.  Denies alcohol use. He denies fever, chills, no acute chest pains, no shortness of breath.  -ED: lipase is mildly elevated from 118 to 220. hospitalist is asked for observation for acute pancreatitis   Review of Systems: The patient denies anorexia, fever, weight loss,, vision loss, decreased hearing, hoarseness, chest pain, syncope, dyspnea on exertion, peripheral edema, balance deficits, hemoptysis, abdominal pain, melena, hematochezia, severe indigestion/heartburn, hematuria, incontinence, genital sores, muscle weakness, suspicious skin lesions, transient blindness, difficulty walking, depression, unusual weight change, abnormal  bleeding, enlarged lymph nodes, angioedema, and breast masses.    Hospital Course by Problem:   Mild acute pancreatitis. Unclear etiology. Ct showed- unremarkable gallbladder and biliary system. LFTS/bili: unremarkable. Denies etoh use. CT: Small amount of peri pancreatic free fluid about the pancreas body and tail compatible with mild pancreatitis. No formed fluid collection, large pseudo cyst, or abscess.  - improved, U/S normal -outpatient GI follow up if re-occurs  ESRD on HD (TTS). Consulted nephrology -HD x2  HTN.  -resume home meds  Chronic HF. Systolic HF.  - needs volume removed  Anemia. AoCD. No s/s of bleeding. Monitor   DVT (on coumadin). Resume coumadin per pharmacy  -has not been taking at home on a regular basis and always refused heparin bridge -long discussion about compliance  CT abd: chronic indeterminate 4.4 cm left renal mass. Recommend outpatient follow up with mri at discharge     Medical Consultants:    renal.   Discharge Exam:   Vitals:   05/06/17 1041 05/06/17 1133  BP: (!) 148/100 (!) 151/98  Pulse: 73 72  Resp: 18 18  Temp: 98.9 F (37.2 C) 97.7 F (36.5 C)  SpO2:  97%   Vitals:   05/06/17 1000 05/06/17 1030 05/06/17 1041 05/06/17 1133  BP: (!) 151/91 (!) 161/95 (!) 148/100 (!) 151/98  Pulse: 68 73 73 72  Resp:   18 18  Temp:   98.9 F (37.2 C) 97.7 F (36.5 C)  TempSrc:   Oral Axillary  SpO2:    97%  Weight:   72.1 kg (158 lb 15.2 oz)   Height:        Gen:  NAD-- eating well   The results of significant diagnostics from this hospitalization (including imaging, microbiology,  ancillary and laboratory) are listed below for reference.     Procedures and Diagnostic Studies:   Dg Abd 1 View  Result Date: 05/04/2017 CLINICAL DATA:  51 y/o  M; upper and mid abdominal pain. EXAM: ABDOMEN - 1 VIEW COMPARISON:  05/01/2017 CT abdomen and pelvis. FINDINGS: The bowel gas pattern is normal. No radio-opaque calculi or other  significant radiographic abnormality are seen. Vascular calcifications in left hemipelvis related to since renal transplant. IMPRESSION: Normal bowel gas pattern. Electronically Signed   By: Kristine Garbe M.D.   On: 05/04/2017 17:26   US Abdomen Limited Ruq  Result Date: 05/05/2017 CLINICAL DATA:  Inpatient with reported history of acute pancreatitis. EXAM: ULTRASOUND ABDOMEN LIMITED RIGHT UPPER QUADRANT COMPARISON:  05/01/2017 unenhanced CT abdomen/pelvis. FINDINGS: Gallbladder: No gallstones. No gallbladder wall thickening. No significant gallbladder distention. No sonographic Murphy's sign. Small amount of pericholecystic fluid. Common bile duct: Diameter: 6 mm, top-normal Liver: No focal lesion identified. Within normal limits in parenchymal echogenicity. Portal vein is patent on color Doppler imaging with normal direction of blood flow towards the liver. Right pleural effusion noted. IMPRESSION: 1. No cholelithiasis. No sonographic findings of acute cholecystitis. 2. Nonspecific small volume pericholecystic fluid, probably secondary to pancreatitis. 3. Top-normal caliber common bile duct (6 mm diameter). 4. Normal liver. 5. Right pleural effusion. Electronically Signed   By: Ilona Sorrel M.D.   On: 05/05/2017 10:45     Labs:   Basic Metabolic Panel:  Recent Labs Lab 05/01/17 0434 05/03/17 2203 05/05/17 1358 05/05/17 2015 05/06/17 0715  NA 137 137 136 137 135  K 4.7 4.5 5.5* 3.8 4.7  CL 99* 97* 96* 96* 95*  CO2 _0 GLUCOSE 77 133* 105* 150* 114*  BUN 54* 56* 84* 35* 43*  CREATININE 10.80* 10.35* 13.32* 7.52* 9.17*  CALCIUM 8.4* 8.3* 8.1* 8.1* 7.5*  PHOS  --   --  5.9* 4.1 5.0*   GFR Estimated Creatinine Clearance: 9.5 mL/min (A) (by C-G formula based on SCr of 9.17 mg/dL (H)). Liver Function Tests:  Recent Labs Lab 05/01/17 0434 05/03/17 2203 05/05/17 1358 05/05/17 2015 05/06/17 0715  AST 24 23  --   --   --   ALT 18 15*  --   --   --   ALKPHOS  148* 168*  --   --   --   BILITOT 0.6 0.8  --   --   --   PROT 7.6 7.4  --   --   --   ALBUMIN 3.4* 3.2* 3.0* 2.9* 2.7*    Recent Labs Lab 05/01/17 0434 05/03/17 2203  LIPASE 118* 220*   No results for input(s): AMMONIA in the last 168 hours. Coagulation profile  Recent Labs Lab 05/01/17 0434 05/04/17 0817  INR 1.27 1.13    CBC:  Recent Labs Lab 05/01/17 0434 05/03/17 2203 05/05/17 1358 05/05/17 2015 05/06/17 0715  WBC 6.2 5.4 4.7 4.1 4.2  HGB 9.3* 9.0* 9.0* 8.9* 8.7*  HCT 29.3* 28.5* 28.1* 27.9* 27.4*  MCV 88.8 90.2 88.4 88.3 89.3  PLT 200 191 211 175 175   Cardiac Enzymes: No results for input(s): CKTOTAL, CKMB, CKMBINDEX, TROPONINI in the last 168 hours. BNP: Invalid input(s): POCBNP CBG: No results for input(s): GLUCAP in the last 168 hours. D-Dimer No results for input(s): DDIMER in the last 72 hours. Hgb A1c No results for input(s): HGBA1C in the last 72 hours. Lipid Profile No results for input(s): CHOL, HDL, LDLCALC, TRIG, CHOLHDL, LDLDIRECT  in the last 72 hours. Thyroid function studies No results for input(s): TSH, T4TOTAL, T3FREE, THYROIDAB in the last 72 hours.  Invalid input(s): FREET3 Anemia work up No results for input(s): VITAMINB12, FOLATE, FERRITIN, TIBC, IRON, RETICCTPCT in the last 72 hours. Microbiology No results found for this or any previous visit (from the past 240 hour(s)).   Discharge Instructions:   Discharge Instructions    Discharge instructions    Complete by:  As directed    HD as directed t/th/sat Renal diet   Increase activity slowly    Complete by:  As directed      Allergies as of 05/06/2017      Reactions   Butalbital-apap-caffeine Shortness Of Breath, Swelling, Other (See Comments)   Swelling in throat   Ferrlecit [na Ferric Gluc Cplx In Sucrose] Shortness Of Breath, Swelling, Other (See Comments)   Swelling in throat, tolerates Venofor   Minoxidil Shortness Of Breath   Tylenol [acetaminophen]  Anaphylaxis, Swelling   Darvocet [propoxyphene N-acetaminophen] Hives      Medication List    STOP taking these medications   lisinopril 20 MG tablet Commonly known as:  PRINIVIL,ZESTRIL     TAKE these medications   amLODipine 10 MG tablet Commonly known as:  NORVASC Take 1 tablet (10 mg total) by mouth at bedtime.   carvedilol 6.25 MG tablet Commonly known as:  COREG Take 12.5 mg by mouth 2 (two) times daily.   cloNIDine 0.3 mg/24hr patch Commonly known as:  CATAPRES - Dosed in mg/24 hr Place 0.3 mg onto the skin once a week. On Wednesday   dicyclomine 20 MG tablet Commonly known as:  BENTYL Take 20 mg by mouth every 6 (six) hours.   gabapentin 100 MG capsule Commonly known as:  NEURONTIN Take 100 mg by mouth at bedtime.   hydrALAZINE 100 MG tablet Commonly known as:  APRESOLINE Take 1 tablet (100 mg total) by mouth 2 (two) times daily.   isosorbide mononitrate 60 MG 24 hr tablet Commonly known as:  IMDUR Take 1 tablet (60 mg total) by mouth daily.   metoCLOPramide 5 MG tablet Commonly known as:  REGLAN Take 1 tablet (5 mg total) by mouth every 8 (eight) hours as needed for nausea or vomiting.   ondansetron 8 MG disintegrating tablet Commonly known as:  ZOFRAN ODT 46m ODT q4 hours prn nausea What changed:  how much to take  how to take this  when to take this  reasons to take this  additional instructions   oxyCODONE 5 MG immediate release tablet Commonly known as:  ROXICODONE Take 1 tablet (5 mg total) by mouth every 4 (four) hours as needed for severe pain.   pantoprazole 40 MG tablet Commonly known as:  PROTONIX Take 1 tablet (40 mg total) by mouth 2 (two) times daily before a meal.   promethazine 25 MG tablet Commonly known as:  PHENERGAN Take 25 mg by mouth every 8 (eight) hours as needed.   Respiratory Therapy Supplies Misc 1 each daily. CPAP MACHINE   sevelamer carbonate 800 MG tablet Commonly known as:  RENVELA Take 3 tablets (2,400  mg total) by mouth 3 (three) times daily with meals.   traMADol 50 MG tablet Commonly known as:  ULTRAM Take 1 tablet (50 mg total) by mouth every 12 (twelve) hours as needed for moderate pain or severe pain.   warfarin 5 MG tablet Commonly known as:  COUMADIN Take 1.5 tablets (7.5 mg total) by mouth daily.  Time coordinating discharge: 35 min  Signed:  Tinsleigh Slovacek U Virgina Deakins   [facility] 05/06/2017, 1:36 PM

## 2017-05-08 DIAGNOSIS — D631 Anemia in chronic kidney disease: Secondary | ICD-10-CM | POA: Diagnosis not present

## 2017-05-08 DIAGNOSIS — Z23 Encounter for immunization: Secondary | ICD-10-CM | POA: Diagnosis not present

## 2017-05-08 DIAGNOSIS — N2581 Secondary hyperparathyroidism of renal origin: Secondary | ICD-10-CM | POA: Diagnosis not present

## 2017-05-08 DIAGNOSIS — I82621 Acute embolism and thrombosis of deep veins of right upper extremity: Secondary | ICD-10-CM | POA: Diagnosis not present

## 2017-05-08 DIAGNOSIS — E039 Hypothyroidism, unspecified: Secondary | ICD-10-CM | POA: Diagnosis not present

## 2017-05-08 DIAGNOSIS — N186 End stage renal disease: Secondary | ICD-10-CM | POA: Diagnosis not present

## 2017-05-10 DIAGNOSIS — D631 Anemia in chronic kidney disease: Secondary | ICD-10-CM | POA: Diagnosis not present

## 2017-05-10 DIAGNOSIS — Z23 Encounter for immunization: Secondary | ICD-10-CM | POA: Diagnosis not present

## 2017-05-10 DIAGNOSIS — N186 End stage renal disease: Secondary | ICD-10-CM | POA: Diagnosis not present

## 2017-05-10 DIAGNOSIS — I82621 Acute embolism and thrombosis of deep veins of right upper extremity: Secondary | ICD-10-CM | POA: Diagnosis not present

## 2017-05-10 DIAGNOSIS — N2581 Secondary hyperparathyroidism of renal origin: Secondary | ICD-10-CM | POA: Diagnosis not present

## 2017-05-13 DIAGNOSIS — I82621 Acute embolism and thrombosis of deep veins of right upper extremity: Secondary | ICD-10-CM | POA: Diagnosis not present

## 2017-05-13 DIAGNOSIS — N186 End stage renal disease: Secondary | ICD-10-CM | POA: Diagnosis not present

## 2017-05-13 DIAGNOSIS — Z23 Encounter for immunization: Secondary | ICD-10-CM | POA: Diagnosis not present

## 2017-05-13 DIAGNOSIS — N2581 Secondary hyperparathyroidism of renal origin: Secondary | ICD-10-CM | POA: Diagnosis not present

## 2017-05-13 DIAGNOSIS — D631 Anemia in chronic kidney disease: Secondary | ICD-10-CM | POA: Diagnosis not present

## 2017-05-16 DIAGNOSIS — D631 Anemia in chronic kidney disease: Secondary | ICD-10-CM | POA: Diagnosis not present

## 2017-05-16 DIAGNOSIS — N186 End stage renal disease: Secondary | ICD-10-CM | POA: Diagnosis not present

## 2017-05-16 DIAGNOSIS — Z23 Encounter for immunization: Secondary | ICD-10-CM | POA: Diagnosis not present

## 2017-05-16 DIAGNOSIS — I82621 Acute embolism and thrombosis of deep veins of right upper extremity: Secondary | ICD-10-CM | POA: Diagnosis not present

## 2017-05-16 DIAGNOSIS — N2581 Secondary hyperparathyroidism of renal origin: Secondary | ICD-10-CM | POA: Diagnosis not present

## 2017-05-17 DIAGNOSIS — N2581 Secondary hyperparathyroidism of renal origin: Secondary | ICD-10-CM | POA: Diagnosis not present

## 2017-05-17 DIAGNOSIS — N186 End stage renal disease: Secondary | ICD-10-CM | POA: Diagnosis not present

## 2017-05-17 DIAGNOSIS — I82621 Acute embolism and thrombosis of deep veins of right upper extremity: Secondary | ICD-10-CM | POA: Diagnosis not present

## 2017-05-17 DIAGNOSIS — Z23 Encounter for immunization: Secondary | ICD-10-CM | POA: Diagnosis not present

## 2017-05-17 DIAGNOSIS — D631 Anemia in chronic kidney disease: Secondary | ICD-10-CM | POA: Diagnosis not present

## 2017-05-19 DIAGNOSIS — Z7901 Long term (current) use of anticoagulants: Secondary | ICD-10-CM | POA: Diagnosis not present

## 2017-05-19 DIAGNOSIS — I2510 Atherosclerotic heart disease of native coronary artery without angina pectoris: Secondary | ICD-10-CM | POA: Diagnosis not present

## 2017-05-19 DIAGNOSIS — Z87892 Personal history of anaphylaxis: Secondary | ICD-10-CM | POA: Diagnosis not present

## 2017-05-19 DIAGNOSIS — R1013 Epigastric pain: Secondary | ICD-10-CM | POA: Diagnosis not present

## 2017-05-19 DIAGNOSIS — M549 Dorsalgia, unspecified: Secondary | ICD-10-CM | POA: Diagnosis not present

## 2017-05-19 DIAGNOSIS — Z888 Allergy status to other drugs, medicaments and biological substances status: Secondary | ICD-10-CM | POA: Diagnosis not present

## 2017-05-19 DIAGNOSIS — Z79899 Other long term (current) drug therapy: Secondary | ICD-10-CM | POA: Diagnosis not present

## 2017-05-19 DIAGNOSIS — N261 Atrophy of kidney (terminal): Secondary | ICD-10-CM | POA: Diagnosis not present

## 2017-05-19 DIAGNOSIS — Z7902 Long term (current) use of antithrombotics/antiplatelets: Secondary | ICD-10-CM | POA: Diagnosis not present

## 2017-05-19 DIAGNOSIS — E119 Type 2 diabetes mellitus without complications: Secondary | ICD-10-CM | POA: Diagnosis not present

## 2017-05-19 DIAGNOSIS — N289 Disorder of kidney and ureter, unspecified: Secondary | ICD-10-CM | POA: Diagnosis not present

## 2017-05-19 DIAGNOSIS — Z885 Allergy status to narcotic agent status: Secondary | ICD-10-CM | POA: Diagnosis not present

## 2017-05-19 DIAGNOSIS — R112 Nausea with vomiting, unspecified: Secondary | ICD-10-CM | POA: Diagnosis not present

## 2017-05-19 DIAGNOSIS — G8929 Other chronic pain: Secondary | ICD-10-CM | POA: Diagnosis not present

## 2017-05-19 DIAGNOSIS — I10 Essential (primary) hypertension: Secondary | ICD-10-CM | POA: Diagnosis not present

## 2017-05-19 DIAGNOSIS — Z992 Dependence on renal dialysis: Secondary | ICD-10-CM | POA: Diagnosis not present

## 2017-05-19 DIAGNOSIS — R188 Other ascites: Secondary | ICD-10-CM | POA: Diagnosis not present

## 2017-05-19 DIAGNOSIS — R109 Unspecified abdominal pain: Secondary | ICD-10-CM | POA: Diagnosis not present

## 2017-05-20 DIAGNOSIS — G8929 Other chronic pain: Secondary | ICD-10-CM | POA: Diagnosis not present

## 2017-05-20 DIAGNOSIS — R109 Unspecified abdominal pain: Secondary | ICD-10-CM | POA: Diagnosis not present

## 2017-05-21 DIAGNOSIS — I82621 Acute embolism and thrombosis of deep veins of right upper extremity: Secondary | ICD-10-CM | POA: Diagnosis not present

## 2017-05-21 DIAGNOSIS — T8610 Unspecified complication of kidney transplant: Secondary | ICD-10-CM | POA: Diagnosis not present

## 2017-05-21 DIAGNOSIS — Z992 Dependence on renal dialysis: Secondary | ICD-10-CM | POA: Diagnosis not present

## 2017-05-21 DIAGNOSIS — Z23 Encounter for immunization: Secondary | ICD-10-CM | POA: Diagnosis not present

## 2017-05-21 DIAGNOSIS — D631 Anemia in chronic kidney disease: Secondary | ICD-10-CM | POA: Diagnosis not present

## 2017-05-21 DIAGNOSIS — N186 End stage renal disease: Secondary | ICD-10-CM | POA: Diagnosis not present

## 2017-05-21 DIAGNOSIS — N2581 Secondary hyperparathyroidism of renal origin: Secondary | ICD-10-CM | POA: Diagnosis not present

## 2017-05-22 ENCOUNTER — Encounter (HOSPITAL_COMMUNITY): Payer: Self-pay | Admitting: Emergency Medicine

## 2017-05-22 ENCOUNTER — Emergency Department (HOSPITAL_COMMUNITY)
Admission: EM | Admit: 2017-05-22 | Discharge: 2017-05-23 | Disposition: A | Discharge status: Home / Self Care | Payer: Medicare Other | Attending: Emergency Medicine | Admitting: Emergency Medicine

## 2017-05-22 DIAGNOSIS — N186 End stage renal disease: Secondary | ICD-10-CM | POA: Diagnosis not present

## 2017-05-22 DIAGNOSIS — I132 Hypertensive heart and chronic kidney disease with heart failure and with stage 5 chronic kidney disease, or end stage renal disease: Secondary | ICD-10-CM | POA: Insufficient documentation

## 2017-05-22 DIAGNOSIS — Z992 Dependence on renal dialysis: Secondary | ICD-10-CM | POA: Insufficient documentation

## 2017-05-22 DIAGNOSIS — R9431 Abnormal electrocardiogram [ECG] [EKG]: Secondary | ICD-10-CM | POA: Diagnosis not present

## 2017-05-22 DIAGNOSIS — I5042 Chronic combined systolic (congestive) and diastolic (congestive) heart failure: Secondary | ICD-10-CM | POA: Insufficient documentation

## 2017-05-22 DIAGNOSIS — Z79899 Other long term (current) drug therapy: Secondary | ICD-10-CM | POA: Insufficient documentation

## 2017-05-22 DIAGNOSIS — R110 Nausea: Secondary | ICD-10-CM | POA: Diagnosis not present

## 2017-05-22 DIAGNOSIS — E1122 Type 2 diabetes mellitus with diabetic chronic kidney disease: Secondary | ICD-10-CM | POA: Insufficient documentation

## 2017-05-22 DIAGNOSIS — Z87891 Personal history of nicotine dependence: Secondary | ICD-10-CM | POA: Insufficient documentation

## 2017-05-22 DIAGNOSIS — R1013 Epigastric pain: Secondary | ICD-10-CM | POA: Diagnosis not present

## 2017-05-22 DIAGNOSIS — Z7901 Long term (current) use of anticoagulants: Secondary | ICD-10-CM | POA: Insufficient documentation

## 2017-05-22 NOTE — ED Notes (Signed)
Pt left dept and went upstairs to visit another patient. Pt undischarged

## 2017-05-22 NOTE — ED Notes (Signed)
Pt refusing to have labs draw on triage.

## 2017-05-22 NOTE — ED Triage Notes (Signed)
HD pt MWF last HD yesterday c/o chronic abd pain with nausea, vomiting and diarrhea. Hx of pancreatitis., no fever or chills.

## 2017-05-22 NOTE — ED Notes (Signed)
Pt stated he "was going to the bathroom" during triage. Pt has not been seen since. Called name in lobby multiple times with no response.

## 2017-05-23 DIAGNOSIS — Z992 Dependence on renal dialysis: Secondary | ICD-10-CM | POA: Diagnosis not present

## 2017-05-23 DIAGNOSIS — E8770 Fluid overload, unspecified: Secondary | ICD-10-CM | POA: Diagnosis not present

## 2017-05-23 DIAGNOSIS — E1122 Type 2 diabetes mellitus with diabetic chronic kidney disease: Secondary | ICD-10-CM | POA: Diagnosis not present

## 2017-05-23 DIAGNOSIS — R112 Nausea with vomiting, unspecified: Secondary | ICD-10-CM | POA: Diagnosis not present

## 2017-05-23 DIAGNOSIS — Z888 Allergy status to other drugs, medicaments and biological substances status: Secondary | ICD-10-CM | POA: Diagnosis not present

## 2017-05-23 DIAGNOSIS — G43A1 Cyclical vomiting, intractable: Secondary | ICD-10-CM | POA: Diagnosis not present

## 2017-05-23 DIAGNOSIS — D72829 Elevated white blood cell count, unspecified: Secondary | ICD-10-CM | POA: Diagnosis not present

## 2017-05-23 DIAGNOSIS — N186 End stage renal disease: Secondary | ICD-10-CM | POA: Diagnosis not present

## 2017-05-23 DIAGNOSIS — R1084 Generalized abdominal pain: Secondary | ICD-10-CM | POA: Diagnosis not present

## 2017-05-23 DIAGNOSIS — D631 Anemia in chronic kidney disease: Secondary | ICD-10-CM | POA: Diagnosis not present

## 2017-05-23 DIAGNOSIS — Z7901 Long term (current) use of anticoagulants: Secondary | ICD-10-CM | POA: Diagnosis not present

## 2017-05-23 DIAGNOSIS — I120 Hypertensive chronic kidney disease with stage 5 chronic kidney disease or end stage renal disease: Secondary | ICD-10-CM | POA: Diagnosis not present

## 2017-05-23 DIAGNOSIS — D509 Iron deficiency anemia, unspecified: Secondary | ICD-10-CM | POA: Diagnosis not present

## 2017-05-23 DIAGNOSIS — Z79899 Other long term (current) drug therapy: Secondary | ICD-10-CM | POA: Diagnosis not present

## 2017-05-23 DIAGNOSIS — N2581 Secondary hyperparathyroidism of renal origin: Secondary | ICD-10-CM | POA: Diagnosis not present

## 2017-05-23 LAB — COMPREHENSIVE METABOLIC PANEL
ALT: 10 U/L — AB (ref 17–63)
ANION GAP: 16 — AB (ref 5–15)
AST: 21 U/L (ref 15–41)
Albumin: 3.6 g/dL (ref 3.5–5.0)
Alkaline Phosphatase: 116 U/L (ref 38–126)
BUN: 56 mg/dL — ABNORMAL HIGH (ref 6–20)
CHLORIDE: 98 mmol/L — AB (ref 101–111)
CO2: 24 mmol/L (ref 22–32)
CREATININE: 11.81 mg/dL — AB (ref 0.61–1.24)
Calcium: 8.8 mg/dL — ABNORMAL LOW (ref 8.9–10.3)
GFR, EST AFRICAN AMERICAN: 5 mL/min — AB (ref >60)
GFR, EST NON AFRICAN AMERICAN: 4 mL/min — AB (ref >60)
Glucose, Bld: 94 mg/dL (ref 65–99)
POTASSIUM: 5.2 mmol/L — AB (ref 3.5–5.1)
Sodium: 138 mmol/L (ref 135–145)
Total Bilirubin: 0.8 mg/dL (ref 0.3–1.2)
Total Protein: 8 g/dL (ref 6.5–8.1)

## 2017-05-23 LAB — CBC
HEMATOCRIT: 33.9 % — AB (ref 39.0–52.0)
HEMOGLOBIN: 10.5 g/dL — AB (ref 13.0–17.0)
MCH: 28.2 pg (ref 26.0–34.0)
MCHC: 31 g/dL (ref 30.0–36.0)
MCV: 90.9 fL (ref 78.0–100.0)
PLATELETS: 178 10*3/uL (ref 150–400)
RBC: 3.73 MIL/uL — AB (ref 4.22–5.81)
RDW: 18.3 % — ABNORMAL HIGH (ref 11.5–15.5)
WBC: 4.8 10*3/uL (ref 4.0–10.5)

## 2017-05-23 LAB — LIPASE, BLOOD: LIPASE: 30 U/L (ref 11–51)

## 2017-05-23 MED ORDER — ONDANSETRON 4 MG PO TBDP
4.0000 mg | ORAL_TABLET | Freq: Once | ORAL | Status: AC
  Administered 2017-05-23: 4 mg via ORAL
  Filled 2017-05-23: qty 1

## 2017-05-23 MED ORDER — OXYCODONE HCL 5 MG PO TABS
10.0000 mg | ORAL_TABLET | Freq: Once | ORAL | Status: AC
  Administered 2017-05-23: 10 mg via ORAL
  Filled 2017-05-23: qty 2

## 2017-05-23 MED ORDER — ONDANSETRON 4 MG PO TBDP: 4.0000 mg | ORAL_TABLET | Freq: Three times a day (TID) | ORAL | 0 refills | Status: DC | PRN

## 2017-05-23 NOTE — ED Notes (Signed)
ED Provider at bedside. 

## 2017-05-23 NOTE — ED Provider Notes (Signed)
[facility] EMERGENCY DEPARTMENT Provider Note   CSN: [id number] Arrival date & time: 05/22/17  1954     History   Chief Complaint Chief Complaint  Patient presents with  . Abdominal Pain    HPI Frank [name] is a 51 y.o. male.  The history is provided by the patient.  Abdominal Pain   This is a recurrent problem. The current episode started 2 days ago. The problem occurs constantly. The problem has been gradually worsening. The pain is located in the epigastric region. The pain is moderate. Associated symptoms include nausea. Pertinent negatives include fever. The symptoms are aggravated by certain positions. Nothing relieves the symptoms.   Patient with h/o ESRD, pancreatitis, DVT presents with recurrent epigastric abdominal pain that radiates to his back No cp/sob He reports nausea Similar to prior pancreatitis Apparently he does not have h/o ETOH abuse He is due for dialysis today Past Medical History:  Diagnosis Date  . Anemia   . Anxiety   . Chronic combined systolic and diastolic CHF (congestive heart failure) (HCC)    a. EF 20-25% by echo in 08/2015 b. echo 10/2015: EF 35-40%, diffuse HK, severe LAE, moderate RAE, small pericardial effusion  . Complication of anesthesia    itching, sore throat  . Depression   . Dialysis patient ([place])   . DVT (deep venous thrombosis) ([place]) 02/2017  . ESRD (end stage renal disease) ([place])    due to HTN per patient, followed at [facility], s/p failed kidney transplant - dialysis Tue, Th, Sat  . Hyperkalemia 12/2015  . Hypertension   . Junctional rhythm    a. noted in 08/2015: hyperkalemic at that time  b. 12/2015: presented in junctional rhythm w/ K+ of 6.6. Resolved with improvement of K+ levels.  . Nonischemic cardiomyopathy ([place])    a. 08/2014: cath showing minimal CAD, but tortuous arteries noted.   . Personal history of DVT (deep vein thrombosis)/ PE 05/26/2016   In Oct 2015 had small subsemental LUL PE w/o DVT  (LE dopplers neg) and was felt to be HD cath related, treated w coumadin.  IN May 2016 had small vein DVT (acute/subacute) in the R basilic/ brachial veins of the RUE, resumed on coumadin.  Had R sided HD cath at that time.    . Renal insufficiency   . Shortness of breath   . Type II diabetes mellitus (HCC)    No history per patient, but remains under history as A1c would not be accurate given on dialysis    Patient Active Problem List   Diagnosis Date Noted  . Recurrent deep venous thrombosis ([place]) 04/27/2017  . Marijuana abuse 04/21/2017  . Acute DVT (deep venous thrombosis) ([place]) 03/13/2017  . DVT (deep venous thrombosis) ([place]) 03/11/2017  . Anemia 02/24/2017  . Volume overload 01/13/2017  . Aortic atherosclerosis ([place]) 01/05/2017  . Abdominal pain 08/04/2016  . Uremia 06/07/2016  . Hyperkalemia 05/29/2016  . GERD (gastroesophageal reflux disease) 05/29/2016  . Personal history of DVT (deep vein thrombosis)/ PE 05/26/2016  . Nonischemic cardiomyopathy ([place]) 01/09/2016  . Bilateral low back pain without sciatica   . Renal cyst, left 10/30/2015  . Constipation by delayed colonic transit 10/30/2015  . Acute on chronic combined systolic and diastolic congestive heart failure ([place]) 09/23/2015  . Chest pain 09/08/2015  . Adjustment disorder with mixed anxiety and depressed mood 08/20/2015  . Essential hypertension 01/02/2015  . Dyslipidemia   . Malignant hypertension 11/29/2014  . ESRD on hemodialysis ([place])   .  Acute pulmonary edema (HCC)   . DM (diabetes mellitus), type 2, uncontrolled, with renal complications ([place])   . Complex sleep apnea syndrome 05/05/2014  . Anemia of chronic kidney failure 06/24/2013  . Nausea & vomiting 06/24/2013    Past Surgical History:  Procedure Laterality Date  . CAPD INSERTION    . CAPD REMOVAL    . INGUINAL HERNIA REPAIR Right 02/14/2015   Procedure: REPAIR INCARCERATED RIGHT INGUINAL HERNIA;  Surgeon: [name], MD;  Location: [facility];   Service: General;  Laterality: Right;  . INSERTION OF DIALYSIS CATHETER Right 09/23/2015   Procedure: exchange of Right internal Dialysis Catheter.;  Surgeon: [name], MD;  Location: [facility];  Service: Vascular;  Laterality: Right;  . IR GENERIC HISTORICAL  07/16/2016   IR US GUIDE VASC ACCESS LEFT 07/16/2016 Corrie Mckusick, DO [facility]-INTERV RAD  . IR GENERIC HISTORICAL Left 07/16/2016   IR THROMBECTOMY AV FISTULA W/THROMBOLYSIS/PTA INC/SHUNT/IMG LEFT 07/16/2016 Corrie Mckusick, DO [facility]INTER[facility]  . KIDNEY RECEIPIENT  2006   failed and started HD in March 2014  . LEFT HEART CATHETERIZATION WITH CORONARY ANGIOGRAM N/A 09/02/2014   Procedure: LEFT HEART CATHETERIZATION WITH CORONARY ANGIOGRAM;  Surgeon: [name], MD;  Location: [facility] C[facility] LAB;  Service: Cardiovascular;  Laterality: N/A;       Home Medications    Prior to Admission medications   Medication Sig Start Date End Date Taking? Authorizing Provider  amLODipine (NORVASC) 10 MG tablet Take 1 tablet (10 mg total) by mouth at bedtime. 12/01/16  Yes Rice, Resa Miner, MD  carvedilol (COREG) 6.25 MG tablet Take 12.5 mg by mouth 2 (two) times daily. 04/03/17  Yes [provider]  cloNIDine (CATAPRES - DOSED IN MG/24 HR) 0.3 mg/24hr patch Place 0.3 mg onto the skin once a week. On Wednesday  12/31/16  Yes [provider]  dicyclomine (BENTYL) 20 MG tablet Take 20 mg by mouth every 6 (six) hours. 04/26/17  Yes [provider]  gabapentin (NEURONTIN) 100 MG capsule Take 100 mg by mouth at bedtime. 03/20/17  Yes [provider]  hydrALAZINE (APRESOLINE) 100 MG tablet Take 1 tablet (100 mg total) by mouth 2 (two) times daily. 01/07/17  Yes Ledell Noss, MD  isosorbide mononitrate (IMDUR) 60 MG 24 hr tablet Take 1 tablet (60 mg total) by mouth daily. 01/08/17  Yes Ledell Noss, MD  metoCLOPramide (REGLAN) 5 MG tablet Take 1 tablet (5 mg total) by mouth every 8 (eight) hours as needed for nausea or vomiting. 05/01/17  Yes  Duffy Bruce, MD  ondansetron (ZOFRAN ODT) 8 MG disintegrating tablet 2m ODT q4 hours prn nausea Patient taking differently: Take 8 mg by mouth every 4 (four) hours as needed for nausea.  04/22/17  Yes Vann, Jessica U, DO  oxyCODONE (ROXICODONE) 5 MG immediate release tablet Take 1 tablet (5 mg total) by mouth every 4 (four) hours as needed for severe pain. 05/01/17  Yes IDuffy Bruce MD  pantoprazole (PROTONIX) 40 MG tablet Take 1 tablet (40 mg total) by mouth 2 (two) times daily before a meal. 12/01/16  Yes Rice, CResa Miner MD  promethazine (PHENERGAN) 25 MG tablet Take 25 mg by mouth every 8 (eight) hours as needed. 04/27/17  Yes [provider]  sevelamer carbonate (RENVELA) 800 MG tablet Take 3 tablets (2,400 mg total) by mouth 3 (three) times daily with meals. 04/22/17  Yes Vann, Jessica U, DO  traMADol (ULTRAM) 50 MG tablet Take 1 tablet (50 mg total) by mouth every  12 (twelve) hours as needed for moderate pain or severe pain. 12/01/16  Yes Rice, Resa Miner, MD  warfarin (COUMADIN) 5 MG tablet Take 1.5 tablets (7.5 mg total) by mouth daily. 04/22/17  Yes [name], DO  Respiratory Therapy Supplies MISC 1 each daily. CPAP MACHINE    [provider]    Family History Family History  Problem Relation Age of Onset  . Hypertension Other     Social History Social History  Substance Use Topics  . Smoking status: Former Smoker    Packs/day: 0.00    Years: 1.00    Types: Cigarettes  . Smokeless tobacco: Never Used     Comment: quit Jan 2014  . Alcohol use No     Allergies   Butalbital-apap-caffeine; Ferrlecit [na ferric gluc cplx in sucrose]; Minoxidil; Tylenol [acetaminophen]; and Darvocet [propoxyphene n-acetaminophen]   Review of Systems Review of Systems  Constitutional: Negative for fever.  Respiratory: Negative for shortness of breath.   Cardiovascular: Negative for chest pain.  Gastrointestinal: Positive for abdominal pain and nausea.  All  other systems reviewed and are negative.    Physical Exam Updated Vital Signs BP (!) 187/130   Pulse 92   Temp 98.1 F (36.7 C) (Oral)   Resp 20   Ht 1.88 m (_0 )   Wt 71.7 kg (158 lb)   SpO2 98%   BMI 20.29 kg/m   Physical Exam CONSTITUTIONAL: Chronically ill appearing, no distress HEAD: Normocephalic/atraumatic EYES: EOMI ENMT: Mucous membranes moist NECK: supple no meningeal signs SPINE/BACK:entire spine nontender CV: S1/S2 noted, no murmurs/rubs/gallops noted LUNGS: Lungs are clear to auscultation bilaterally, no apparent distress ABDOMEN: soft, moderate epigastric tenderness, no rebound or guarding, bowel sounds noted throughout abdomen NEURO: Pt is awake/alert/appropriate, moves all extremitiesx4.  No facial droop.  Patient walking around the ER in no distress EXTREMITIES: pulses normal/equal, full ROM, dialysis access to left UE, thrill noted SKIN: dry skin noted PSYCH: no abnormalities of mood noted, alert and oriented to situation   ED Treatments / Results  Labs (all labs ordered are listed, but only abnormal results are displayed) Labs Reviewed  COMPREHENSIVE METABOLIC PANEL - Abnormal; Notable for the following:       Result Value   Potassium 5.2 (*)    Chloride 98 (*)    BUN 56 (*)    Creatinine, Ser 11.81 (*)    Calcium 8.8 (*)    ALT 10 (*)    GFR calc non Af Amer 4 (*)    GFR calc Af Amer 5 (*)    Anion gap 16 (*)    All other components within normal limits  CBC - Abnormal; Notable for the following:    RBC 3.73 (*)    Hemoglobin 10.5 (*)    HCT 33.9 (*)    RDW 18.3 (*)    All other components within normal limits  LIPASE, BLOOD    EKG  EKG Interpretation  Date/Time:  Friday May 23 2017 04:05:08 EDT Ventricular Rate:  79 PR Interval:    QRS Duration: 97 QT Interval:  440 QTC Calculation: 505 R Axis:   59 Text Interpretation:  Sinus rhythm Left atrial enlargement LVH with secondary repolarization abnormality Prolonged QT  interval Baseline wander in lead(s) V2 Confirmed by Ripley Fraise [id number]) on 05/23/2017 4:27:35 AM       Radiology No results found.  Procedures Procedures (including critical care time)  Medications Ordered in ED Medications  oxyCODONE (Oxy IR/ROXICODONE) immediate release tablet 10  mg (not administered)     Initial Impression / Assessment and Plan / ED Course  I have reviewed the triage vital signs and the nursing notes.  Pertinent labs results that were available during my care of the patient were reviewed by me and considered in my medical decision making (see chart for details).     4:02 AM Pt with extensive medical history presents with recurrent epigastric pain Lipases improved Will defer CT imaging as he has had multiple CT scans previously Doubt other acute abdominal emergency at this time Will give oral pain meds and reassess 6:00 AM Pt stable for d/c home Will need to go to dialysis  Final Clinical Impressions(s) / ED Diagnoses   Final diagnoses:  Epigastric pain    New Prescriptions New Prescriptions   No medications on file     Ripley Fraise, MD 05/23/17 0601

## 2017-05-23 NOTE — ED Notes (Signed)
Pt pacing the unit; pt told to not stand at nursing station due to confidential information being discussed; pt returned to room.

## 2017-05-24 IMAGING — CR DG CHEST 2V
2 series · 2 of 2 positions shown · non-contrast
Comparison: Chest radiograph performed 01/02/2015

CLINICAL DATA: Acute onset of generalized chest pain and sore
throat for 2 days. Initial encounter.

EXAM:
CHEST  2 VIEW

[chest lat]
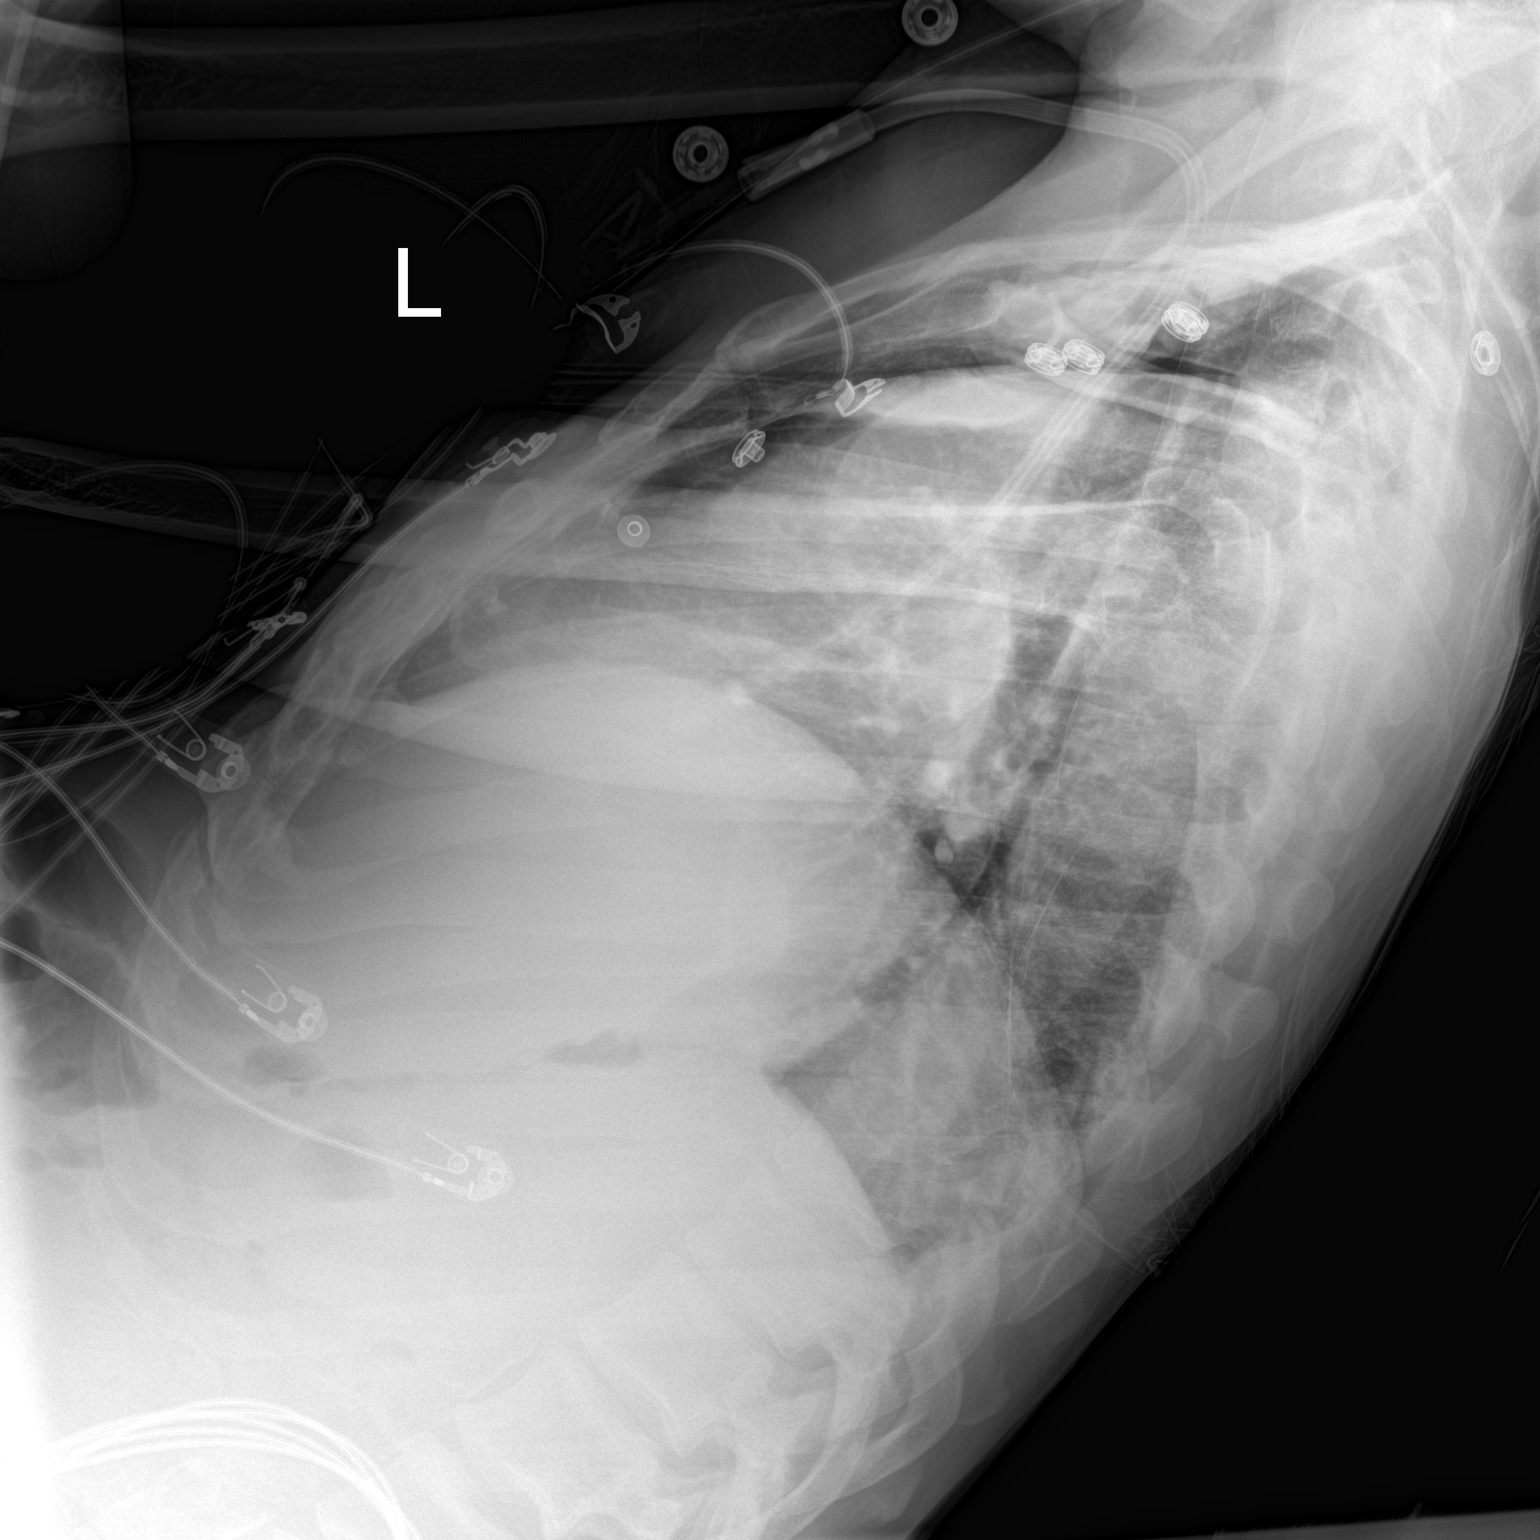

[chest ap]
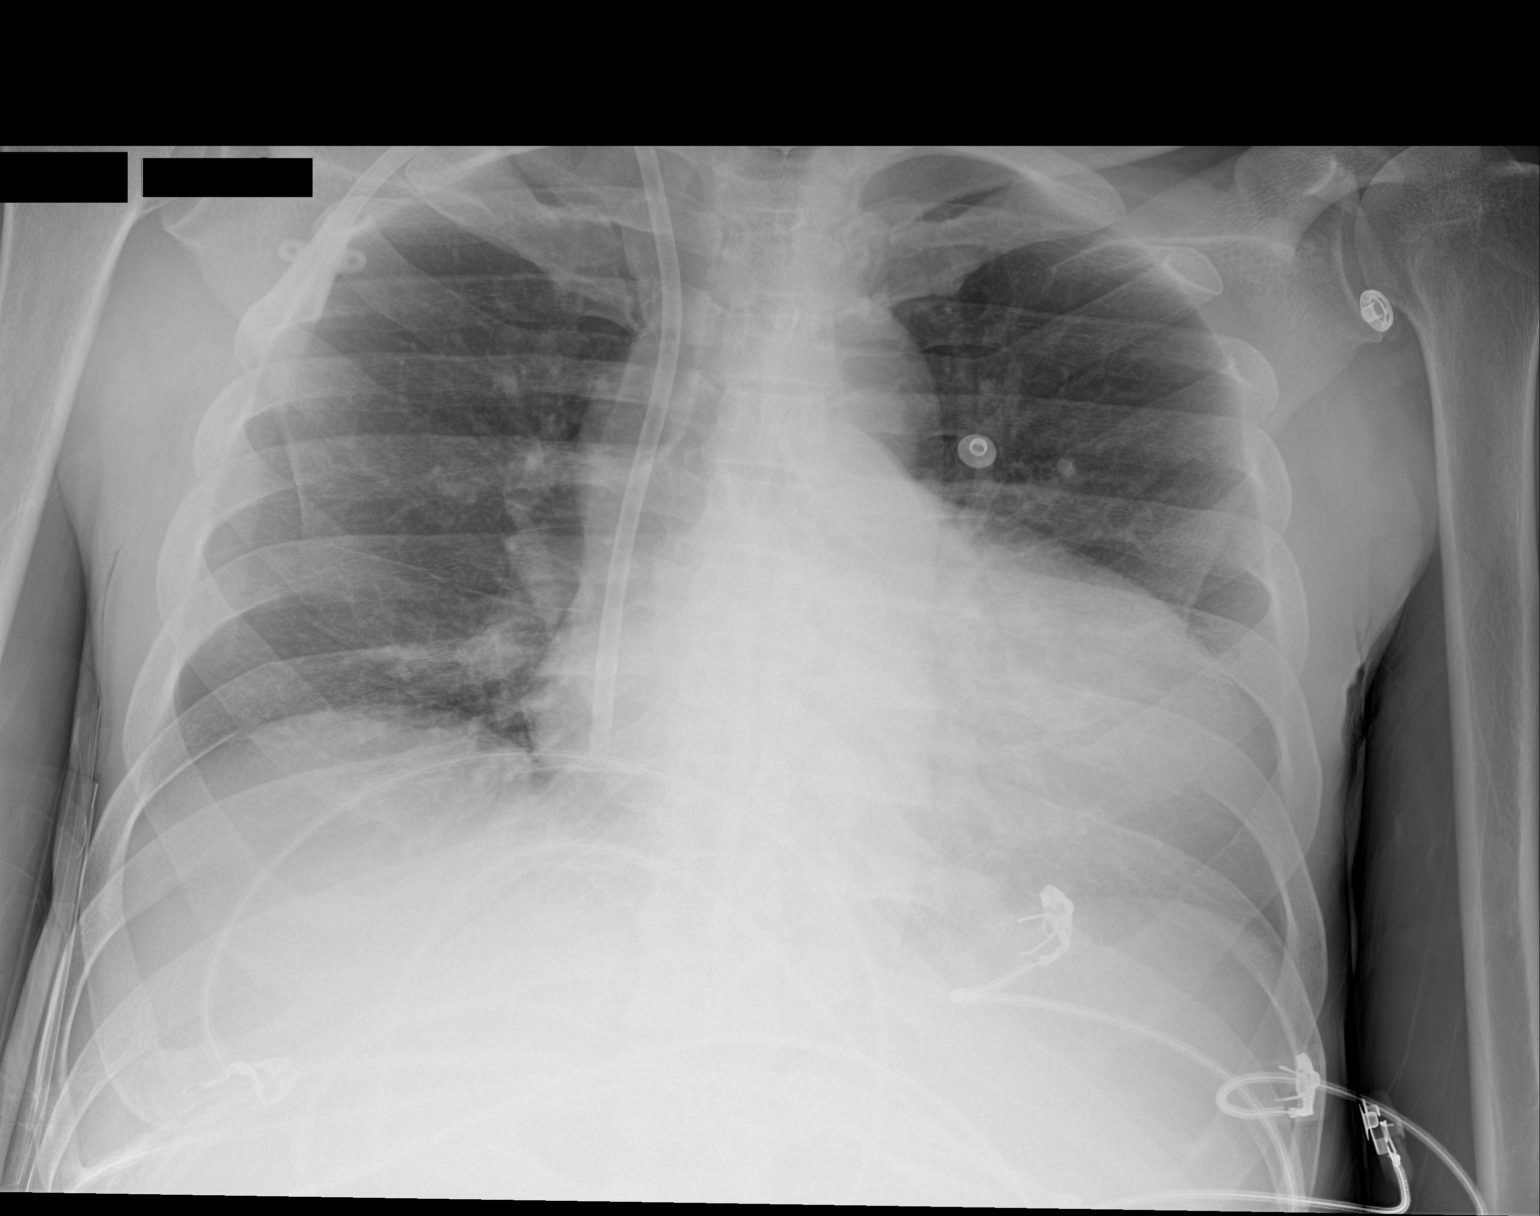

[2 of 2 positions shown; findings below may reference images not displayed]

FINDINGS: The lungs are relatively well expanded. Mild vascular congestion is
noted. Mild bibasilar atelectasis is seen. No pleural effusion or
pneumothorax is identified.

The cardiomediastinal silhouette is mildly enlarged. No acute
osseous abnormalities are identified. A right-sided dual-lumen
catheter is noted ending overlying the right atrium.
IMPRESSION: Mild vascular congestion and mild cardiomegaly noted. Mild bibasilar
atelectasis seen.

## 2017-05-26 ENCOUNTER — Encounter (HOSPITAL_COMMUNITY): Payer: Self-pay | Admitting: *Deleted

## 2017-05-26 ENCOUNTER — Non-Acute Institutional Stay (HOSPITAL_COMMUNITY)
Admission: EM | Admit: 2017-05-26 | Discharge: 2017-05-26 | Disposition: A | Discharge status: Home / Self Care | Payer: Medicare Other | Attending: Emergency Medicine | Admitting: Emergency Medicine

## 2017-05-26 ENCOUNTER — Other Ambulatory Visit: Payer: Self-pay

## 2017-05-26 DIAGNOSIS — I5042 Chronic combined systolic (congestive) and diastolic (congestive) heart failure: Secondary | ICD-10-CM | POA: Diagnosis not present

## 2017-05-26 DIAGNOSIS — Z940 Kidney transplant status: Secondary | ICD-10-CM | POA: Insufficient documentation

## 2017-05-26 DIAGNOSIS — Z86718 Personal history of other venous thrombosis and embolism: Secondary | ICD-10-CM | POA: Insufficient documentation

## 2017-05-26 DIAGNOSIS — N281 Cyst of kidney, acquired: Secondary | ICD-10-CM | POA: Insufficient documentation

## 2017-05-26 DIAGNOSIS — N186 End stage renal disease: Secondary | ICD-10-CM | POA: Diagnosis not present

## 2017-05-26 DIAGNOSIS — K219 Gastro-esophageal reflux disease without esophagitis: Secondary | ICD-10-CM | POA: Insufficient documentation

## 2017-05-26 DIAGNOSIS — Z79899 Other long term (current) drug therapy: Secondary | ICD-10-CM | POA: Insufficient documentation

## 2017-05-26 DIAGNOSIS — Z992 Dependence on renal dialysis: Secondary | ICD-10-CM | POA: Diagnosis not present

## 2017-05-26 DIAGNOSIS — I2510 Atherosclerotic heart disease of native coronary artery without angina pectoris: Secondary | ICD-10-CM | POA: Diagnosis not present

## 2017-05-26 DIAGNOSIS — E1122 Type 2 diabetes mellitus with diabetic chronic kidney disease: Secondary | ICD-10-CM | POA: Insufficient documentation

## 2017-05-26 DIAGNOSIS — G8929 Other chronic pain: Secondary | ICD-10-CM

## 2017-05-26 DIAGNOSIS — I120 Hypertensive chronic kidney disease with stage 5 chronic kidney disease or end stage renal disease: Secondary | ICD-10-CM | POA: Diagnosis not present

## 2017-05-26 DIAGNOSIS — I132 Hypertensive heart and chronic kidney disease with heart failure and with stage 5 chronic kidney disease, or end stage renal disease: Secondary | ICD-10-CM | POA: Diagnosis not present

## 2017-05-26 DIAGNOSIS — I428 Other cardiomyopathies: Secondary | ICD-10-CM | POA: Diagnosis not present

## 2017-05-26 DIAGNOSIS — Z7901 Long term (current) use of anticoagulants: Secondary | ICD-10-CM | POA: Insufficient documentation

## 2017-05-26 DIAGNOSIS — E875 Hyperkalemia: Secondary | ICD-10-CM | POA: Insufficient documentation

## 2017-05-26 DIAGNOSIS — Z87891 Personal history of nicotine dependence: Secondary | ICD-10-CM | POA: Insufficient documentation

## 2017-05-26 DIAGNOSIS — R109 Unspecified abdominal pain: Secondary | ICD-10-CM | POA: Diagnosis not present

## 2017-05-26 LAB — COMPREHENSIVE METABOLIC PANEL WITH GFR
ALT: 12 U/L — ABNORMAL LOW (ref 17–63)
AST: 28 U/L (ref 15–41)
Albumin: 3.7 g/dL (ref 3.5–5.0)
Alkaline Phosphatase: 143 U/L — ABNORMAL HIGH (ref 38–126)
Anion gap: 18 — ABNORMAL HIGH (ref 5–15)
BUN: 86 mg/dL — ABNORMAL HIGH (ref 6–20)
CO2: 21 mmol/L — ABNORMAL LOW (ref 22–32)
Calcium: 8.6 mg/dL — ABNORMAL LOW (ref 8.9–10.3)
Chloride: 98 mmol/L — ABNORMAL LOW (ref 101–111)
Creatinine, Ser: 13.93 mg/dL — ABNORMAL HIGH (ref 0.61–1.24)
GFR calc Af Amer: 4 mL/min — ABNORMAL LOW
GFR calc non Af Amer: 3 mL/min — ABNORMAL LOW
Glucose, Bld: 100 mg/dL — ABNORMAL HIGH (ref 65–99)
Potassium: 6.9 mmol/L (ref 3.5–5.1)
Sodium: 137 mmol/L (ref 135–145)
Total Bilirubin: 0.7 mg/dL (ref 0.3–1.2)
Total Protein: 8.2 g/dL — ABNORMAL HIGH (ref 6.5–8.1)

## 2017-05-26 LAB — CBC
HEMATOCRIT: 33.6 % — AB (ref 39.0–52.0)
HEMOGLOBIN: 10.8 g/dL — AB (ref 13.0–17.0)
MCH: 28.8 pg (ref 26.0–34.0)
MCHC: 32.1 g/dL (ref 30.0–36.0)
MCV: 89.6 fL (ref 78.0–100.0)
Platelets: 178 10*3/uL (ref 150–400)
RBC: 3.75 MIL/uL — AB (ref 4.22–5.81)
RDW: 17.8 % — AB (ref 11.5–15.5)
WBC: 5.1 10*3/uL (ref 4.0–10.5)

## 2017-05-26 LAB — LIPASE, BLOOD: LIPASE: 30 U/L (ref 11–51)

## 2017-05-26 MED ORDER — SODIUM BICARBONATE 8.4 % IV SOLN: 50.0000 meq | Freq: Once | INTRAVENOUS | Status: DC

## 2017-05-26 MED ORDER — HYDROMORPHONE HCL 1 MG/ML IJ SOLN
1.0000 mg | Freq: Once | INTRAMUSCULAR | Status: AC
  Administered 2017-05-26: 1 mg via INTRAMUSCULAR
  Filled 2017-05-26: qty 1

## 2017-05-26 MED ORDER — ALBUTEROL SULFATE (2.5 MG/3ML) 0.083% IN NEBU
10.0000 mg | INHALATION_SOLUTION | Freq: Once | RESPIRATORY_TRACT | Status: AC
  Administered 2017-05-26: 10 mg via RESPIRATORY_TRACT
  Filled 2017-05-26: qty 12

## 2017-05-26 NOTE — ED Notes (Signed)
Report given to [place], [place] [facility]. Requested him to come in wheelchair.

## 2017-05-26 NOTE — ED Triage Notes (Signed)
The pt is c/o abd pain he reports that he has pamcreatitis and has had pain since Thursday n and v  Dialysis pt

## 2017-05-26 NOTE — ED Notes (Signed)
2 sticks by this RN and 2 by previous RN unsuccessful. IV team consulted.

## 2017-05-26 NOTE — ED Triage Notes (Signed)
The pt had to go to the br  His triage is not completed  He has been gone for 10 minutes I will check back in a FEW M INUTES

## 2017-05-26 NOTE — ED Notes (Signed)
IV team at bedside.

## 2017-05-26 NOTE — ED Notes (Signed)
EDP at bedside attempting IV with dopplar.

## 2017-05-26 NOTE — ED Notes (Signed)
Pt ambulatory to restroom

## 2017-05-26 NOTE — Progress Notes (Signed)
[facility]ID[facility] BRIEF NOTE:  Subjective: Frank [name] is an ESRD patient, usually dialyzes MWF at [facility]. Came to ED this morning with abdominal pain, which has now resolved (intermittent, chronic issue). Found to have K 6.9. No CP or dyspnea.  Objective:  Blood pressure (!) 161/101, pulse 81, temperature (!) 97.4 F (36.3 C), temperature source Oral, resp. rate 16, height _0  (1.88 m), weight 74.4 kg (164 lb), SpO2 99 %.  Gen: Well appearing, NAD. CV: RRR; no murmur Pulm: CTA in upper lobes, faint rales in L base Extrem: 1+ LE edema Access: L AVF + thrill (cannulated)  Assessment: ESRD Hyperkalemia  Plan: HD urgently today, 1K bath. 2.5L UF goal. To be discharged to home afterwards.    Loren Racer, PA-C  [employer] Pager [phone]

## 2017-05-26 NOTE — ED Notes (Signed)
CRITICAL VALUE ALERT  Critical Value:  Potassium 6.9  Date & Time Notied: 05/26/17  Provider Notified: Dr. Colvin Caroli  Orders Received/Actions taken: See orders.

## 2017-05-26 NOTE — ED Provider Notes (Signed)
[facility] EMERGENCY DEPARTMENT Provider Note   CSN: [id number] Arrival date & time: 05/26/17  0601     History   Chief Complaint Chief Complaint  Patient presents with  . Abdominal Pain    HPI Frank [name] is a 51 y.o. male.  HPI Patient reports his pancreatitis pain is severe.  He reports is in the epigastric area.  Is been going on for several days.  He reports he has nausea and vomiting.  Last episode of vomiting this morning.  No fevers chills or diarrhea.  Patient is a dialysis patient.  He reports he typically has a Tuesday Thursday Saturday schedule, but last week he was off schedule so he got Wednesday and Friday.  Reports he is due to get dialyzed tomorrow.  He reports however due to his pain he usually does not get through a full session.  He denies shortness of breath.  He denies cough. Past Medical History:  Diagnosis Date  . Anemia   . Anxiety   . Chronic combined systolic and diastolic CHF (congestive heart failure) (HCC)    a. EF 20-25% by echo in 08/2015 b. echo 10/2015: EF 35-40%, diffuse HK, severe LAE, moderate RAE, small pericardial effusion  . Complication of anesthesia    itching, sore throat  . Depression   . Dialysis patient ([place])   . DVT (deep venous thrombosis) ([place]) 02/2017  . ESRD (end stage renal disease) ([place])    due to HTN per patient, followed at [facility], s/p failed kidney transplant - dialysis Tue, Th, Sat  . Hyperkalemia 12/2015  . Hypertension   . Junctional rhythm    a. noted in 08/2015: hyperkalemic at that time  b. 12/2015: presented in junctional rhythm w/ K+ of 6.6. Resolved with improvement of K+ levels.  . Nonischemic cardiomyopathy ([place])    a. 08/2014: cath showing minimal CAD, but tortuous arteries noted.   . Personal history of DVT (deep vein thrombosis)/ PE 05/26/2016   In Oct 2015 had small subsemental LUL PE w/o DVT (LE dopplers neg) and was felt to be HD cath related, treated w coumadin.  IN May 2016 had small  vein DVT (acute/subacute) in the R basilic/ brachial veins of the RUE, resumed on coumadin.  Had R sided HD cath at that time.    . Renal insufficiency   . Shortness of breath   . Type II diabetes mellitus (HCC)    No history per patient, but remains under history as A1c would not be accurate given on dialysis    Patient Active Problem List   Diagnosis Date Noted  . ESRD (end stage renal disease) ([place]) 05/26/2017  . Recurrent deep venous thrombosis ([place]) 04/27/2017  . Marijuana abuse 04/21/2017  . Acute DVT (deep venous thrombosis) ([place]) 03/13/2017  . DVT (deep venous thrombosis) ([place]) 03/11/2017  . Anemia 02/24/2017  . Volume overload 01/13/2017  . Aortic atherosclerosis ([place]) 01/05/2017  . Abdominal pain 08/04/2016  . Uremia 06/07/2016  . Hyperkalemia 05/29/2016  . GERD (gastroesophageal reflux disease) 05/29/2016  . Personal history of DVT (deep vein thrombosis)/ PE 05/26/2016  . Nonischemic cardiomyopathy ([place]) 01/09/2016  . Bilateral low back pain without sciatica   . Renal cyst, left 10/30/2015  . Constipation by delayed colonic transit 10/30/2015  . Acute on chronic combined systolic and diastolic congestive heart failure ([place]) 09/23/2015  . Chest pain 09/08/2015  . Adjustment disorder with mixed anxiety and depressed mood 08/20/2015  . Essential hypertension 01/02/2015  . Dyslipidemia   .  Malignant hypertension 11/29/2014  . ESRD on hemodialysis ([place])   . Acute pulmonary edema (HCC)   . DM (diabetes mellitus), type 2, uncontrolled, with renal complications ([place])   . Complex sleep apnea syndrome 05/05/2014  . Anemia of chronic kidney failure 06/24/2013  . Nausea & vomiting 06/24/2013    Past Surgical History:  Procedure Laterality Date  . CAPD INSERTION    . CAPD REMOVAL    . IR GENERIC HISTORICAL  07/16/2016   IR US GUIDE VASC ACCESS LEFT 07/16/2016 Corrie Mckusick, DO [facility]-INTERV RAD  . IR GENERIC HISTORICAL Left 07/16/2016   IR THROMBECTOMY AV FISTULA  W/THROMBOLYSIS/PTA INC/SHUNT/IMG LEFT 07/16/2016 Corrie Mckusick, DO [facility]-INTERV RAD  . KIDNEY RECEIPIENT  2006   failed and started HD in March 2014       Home Medications    Prior to Admission medications   Medication Sig Start Date End Date Taking? Authorizing Provider  amLODipine (NORVASC) 10 MG tablet Take 1 tablet (10 mg total) by mouth at bedtime. 12/01/16   Rice, Resa Miner, MD  carvedilol (COREG) 6.25 MG tablet Take 12.5 mg by mouth 2 (two) times daily. 04/03/17   [provider]  cloNIDine (CATAPRES - DOSED IN MG/24 HR) 0.3 mg/24hr patch Place 0.3 mg onto the skin once a week. On Wednesday  12/31/16   [provider]  dicyclomine (BENTYL) 20 MG tablet Take 20 mg by mouth every 6 (six) hours. 04/26/17   [provider]  gabapentin (NEURONTIN) 100 MG capsule Take 100 mg by mouth at bedtime. 03/20/17   [provider]  hydrALAZINE (APRESOLINE) 100 MG tablet Take 1 tablet (100 mg total) by mouth 2 (two) times daily. 01/07/17   Ledell Noss, MD  isosorbide mononitrate (IMDUR) 60 MG 24 hr tablet Take 1 tablet (60 mg total) by mouth daily. 01/08/17   Ledell Noss, MD  metoCLOPramide (REGLAN) 5 MG tablet Take 1 tablet (5 mg total) by mouth every 8 (eight) hours as needed for nausea or vomiting. 05/01/17   Duffy Bruce, MD  ondansetron (ZOFRAN-ODT) 4 MG disintegrating tablet Take 1 tablet (4 mg total) by mouth every 8 (eight) hours as needed for nausea. 41m ODT q4 hours prn nausea 05/23/17   WRipley Fraise MD  oxyCODONE (ROXICODONE) 5 MG immediate release tablet Take 1 tablet (5 mg total) by mouth every 4 (four) hours as needed for severe pain. 05/01/17   IDuffy Bruce MD  pantoprazole (PROTONIX) 40 MG tablet Take 1 tablet (40 mg total) by mouth 2 (two) times daily before a meal. 12/01/16   Rice, CResa Miner MD  promethazine (PHENERGAN) 25 MG tablet Take 25 mg by mouth every 8 (eight) hours as needed. 04/27/17   [provider]  Respiratory Therapy  Supplies MISC 1 each daily. CPAP MACHINE    [provider]  sevelamer carbonate (RENVELA) 800 MG tablet Take 3 tablets (2,400 mg total) by mouth 3 (three) times daily with meals. 04/22/17   VGeradine Girt DO  traMADol (ULTRAM) 50 MG tablet Take 1 tablet (50 mg total) by mouth every 12 (twelve) hours as needed for moderate pain or severe pain. 12/01/16   RCollier Salina MD  warfarin (COUMADIN) 5 MG tablet Take 1.5 tablets (7.5 mg total) by mouth daily. 04/22/17   VGeradine Girt DO    Family History Family History  Problem Relation Age of Onset  . Hypertension Other     Social History Social History   Tobacco Use  . Smoking status: Former  Smoker    Packs/day: 0.00    Years: 1.00    Pack years: 0.00    Types: Cigarettes  . Smokeless tobacco: Never Used  . Tobacco comment: quit Jan 2014  Substance Use Topics  . Alcohol use: No  . Drug use: Yes    Types: Marijuana     Allergies   Butalbital-apap-caffeine; Ferrlecit [na ferric gluc cplx in sucrose]; Minoxidil; Tylenol [acetaminophen]; and Darvocet [propoxyphene n-acetaminophen]   Review of Systems Review of Systems 10 Systems reviewed and are negative for acute change except as noted in the HPI.   Physical Exam Updated Vital Signs BP (!) 161/101 (BP Location: Right Arm)   Pulse 81   Temp (!) 97.4 F (36.3 C) (Oral)   Resp 16   Ht _0  (1.88 m)   Wt 74.4 kg (164 lb)   SpO2 99%   BMI 21.06 kg/m   Physical Exam  Constitutional: He appears well-developed and well-nourished. He does not appear ill.  Patient is nontoxic and alert.  No respiratory distress.  He reports abdominal pain but is calm and appropriate.  HENT:  Head: Normocephalic and atraumatic.  Mouth/Throat: Oropharynx is clear and moist.  Eyes: Conjunctivae and EOM are normal.  Neck: Neck supple.  Cardiovascular: Normal rate, regular rhythm and intact distal pulses.  No murmur heard. Pulmonary/Chest: Effort normal and breath sounds  normal. No respiratory distress.  Abdominal: Soft. Bowel sounds are normal. There is tenderness.  Abdomen is soft with moderate to severe epigastric tenderness.  No guarding.  Musculoskeletal: Normal range of motion. He exhibits no edema.  Trace peripheral edema.  No calf tenderness.  Neurological: He is alert.  Skin: Skin is warm and dry.  Psychiatric: He has a normal mood and affect.  Nursing note and vitals reviewed.    ED Treatments / Results  Labs (all labs ordered are listed, but only abnormal results are displayed) Labs Reviewed  COMPREHENSIVE METABOLIC PANEL - Abnormal; Notable for the following components:      Result Value   Potassium 6.9 (*)    Chloride 98 (*)    CO2 21 (*)    Glucose, Bld 100 (*)    BUN 86 (*)    Creatinine, Ser 13.93 (*)    Calcium 8.6 (*)    Total Protein 8.2 (*)    ALT 12 (*)    Alkaline Phosphatase 143 (*)    GFR calc non Af Amer 3 (*)    GFR calc Af Amer 4 (*)    Anion gap 18 (*)    All other components within normal limits  CBC - Abnormal; Notable for the following components:   RBC 3.75 (*)    Hemoglobin 10.8 (*)    HCT 33.6 (*)    RDW 17.8 (*)    All other components within normal limits  LIPASE, BLOOD    EKG  EKG Interpretation  Date/Time:  Monday May 26 2017 08:41:17 EST Ventricular Rate:  63 PR Interval:    QRS Duration: 104 QT Interval:  492 QTC Calculation: 504 R Axis:   -33 Text Interpretation:  Sinus rhythm Left axis deviation Abnormal R-wave progression, late transition Abnormal T, consider ischemia, lateral leads Prolonged QT interval no sig change from previous Confirmed by Charlesetta Shanks [phone]) on 05/26/2017 9:36:35 AM       Radiology No results found.  Procedures Procedures (including critical care time)  Medications Ordered in ED Medications  sodium bicarbonate injection 50 mEq (not administered)  albuterol (PROVENTIL) (2.5  MG/3ML) 0.083% nebulizer solution 10 mg (10 mg Nebulization Given 05/26/17  0947)  HYDROmorphone (DILAUDID) injection 1 mg (1 mg Intramuscular Given 05/26/17 1116)     Initial Impression / Assessment and Plan / ED Course  I have reviewed the triage vital signs and the nursing notes.  Pertinent labs & imaging results that were available during my care of the patient were reviewed by me and considered in my medical decision making (see chart for details).    Consult: (11: 08) reviewed with Dr. Posey Pronto of nephrology.  They will schedule the patient for dialysis from the emergency department.  Final Clinical Impressions(s) / ED Diagnoses   Final diagnoses:  Chronic abdominal pain  ESRD needing dialysis ([place])  Hyperkalemia  Patient presents with chief complaint of abdominal pain and history of chronic pancreatitis.  Patient is alert and nontoxic.  Clinically is not showing signs of distress.  Abdomen is soft without guarding but he endorses severe epigastric pain.  Lipase is within normal limits.  At this time, chronic pancreatitis appears stable and I do not suspect other etiology for the patient's epigastric pain.  He did report vomiting this morning before coming to the hospital and once in the emergency department.  (Reportedly, during his triage, patient reported needing to go to the bathroom and did not return.  Other staff reported seeing the patient in the cafeteria eating breakfast at that time).  From perspective the patient's abdominal pain chief complaint I feel the patient is stable and does not require inpatient treatment.  He will be stable for discharge.  Pain suspected due to chronic pancreatitis.  Patient is also dialysis patient and reports having gotten dialyzed on Wednesday and Friday last week.  This apparently is not his regular schedule.  He denies he is scheduled for dialysis this morning which is Monday.  He reports his next dialysis is tomorrow.  It is found to be hyperkalemic at 6.9 without evidence of hemolysis.  Nephrology called and will dialyze  the patient from the emergency department.    ED Discharge Orders    None       Charlesetta Shanks, MD 05/26/17 1257

## 2017-05-26 NOTE — ED Triage Notes (Signed)
The pt was downstairs in the cafeteria eating breakfast just a few minutes ago   Staff saw him there

## 2017-05-27 DIAGNOSIS — D509 Iron deficiency anemia, unspecified: Secondary | ICD-10-CM | POA: Diagnosis not present

## 2017-05-27 DIAGNOSIS — E8770 Fluid overload, unspecified: Secondary | ICD-10-CM | POA: Diagnosis not present

## 2017-05-27 DIAGNOSIS — N2581 Secondary hyperparathyroidism of renal origin: Secondary | ICD-10-CM | POA: Diagnosis not present

## 2017-05-27 DIAGNOSIS — N186 End stage renal disease: Secondary | ICD-10-CM | POA: Diagnosis not present

## 2017-05-27 DIAGNOSIS — D631 Anemia in chronic kidney disease: Secondary | ICD-10-CM | POA: Diagnosis not present

## 2017-05-29 ENCOUNTER — Emergency Department (HOSPITAL_COMMUNITY)
Admission: EM | Admit: 2017-05-29 | Discharge: 2017-05-29 | Disposition: A | Discharge status: Home / Self Care | Payer: Medicare Other | Attending: Emergency Medicine | Admitting: Emergency Medicine

## 2017-05-29 ENCOUNTER — Encounter (HOSPITAL_COMMUNITY): Payer: Self-pay | Admitting: Emergency Medicine

## 2017-05-29 DIAGNOSIS — R109 Unspecified abdominal pain: Secondary | ICD-10-CM | POA: Diagnosis not present

## 2017-05-29 LAB — COMPREHENSIVE METABOLIC PANEL
ALBUMIN: 3.3 g/dL — AB (ref 3.5–5.0)
ALT: 12 U/L — ABNORMAL LOW (ref 17–63)
ANION GAP: 12 (ref 5–15)
AST: 21 U/L (ref 15–41)
Alkaline Phosphatase: 113 U/L (ref 38–126)
BUN: 50 mg/dL — ABNORMAL HIGH (ref 6–20)
CALCIUM: 8.6 mg/dL — AB (ref 8.9–10.3)
CO2: 27 mmol/L (ref 22–32)
Chloride: 97 mmol/L — ABNORMAL LOW (ref 101–111)
Creatinine, Ser: 9.89 mg/dL — ABNORMAL HIGH (ref 0.61–1.24)
GFR calc non Af Amer: 5 mL/min — ABNORMAL LOW (ref >60)
GFR, EST AFRICAN AMERICAN: 6 mL/min — AB (ref >60)
GLUCOSE: 72 mg/dL (ref 65–99)
POTASSIUM: 4.7 mmol/L (ref 3.5–5.1)
SODIUM: 136 mmol/L (ref 135–145)
TOTAL PROTEIN: 7.1 g/dL (ref 6.5–8.1)
Total Bilirubin: 0.7 mg/dL (ref 0.3–1.2)

## 2017-05-29 LAB — CBC
HEMATOCRIT: 31.9 % — AB (ref 39.0–52.0)
HEMOGLOBIN: 10.2 g/dL — AB (ref 13.0–17.0)
MCH: 28.9 pg (ref 26.0–34.0)
MCHC: 32 g/dL (ref 30.0–36.0)
MCV: 90.4 fL (ref 78.0–100.0)
Platelets: 164 10*3/uL (ref 150–400)
RBC: 3.53 MIL/uL — AB (ref 4.22–5.81)
RDW: 17.8 % — ABNORMAL HIGH (ref 11.5–15.5)
WBC: 4.3 10*3/uL (ref 4.0–10.5)

## 2017-05-29 LAB — LIPASE, BLOOD: Lipase: 30 U/L (ref 11–51)

## 2017-05-29 NOTE — ED Notes (Signed)
Call no answer

## 2017-05-29 NOTE — ED Triage Notes (Signed)
Patient reports chronic mid abdominal pain for several months with nausea/emesis , no diarrhea , no fever or chills .

## 2017-05-29 NOTE — ED Notes (Signed)
Pt name called x 2, no response

## 2017-05-30 DIAGNOSIS — E8770 Fluid overload, unspecified: Secondary | ICD-10-CM | POA: Diagnosis not present

## 2017-05-30 DIAGNOSIS — D509 Iron deficiency anemia, unspecified: Secondary | ICD-10-CM | POA: Diagnosis not present

## 2017-05-30 DIAGNOSIS — N2581 Secondary hyperparathyroidism of renal origin: Secondary | ICD-10-CM | POA: Diagnosis not present

## 2017-05-30 DIAGNOSIS — D631 Anemia in chronic kidney disease: Secondary | ICD-10-CM | POA: Diagnosis not present

## 2017-05-30 DIAGNOSIS — N186 End stage renal disease: Secondary | ICD-10-CM | POA: Diagnosis not present

## 2017-05-30 IMAGING — CR DG CHEST 2V
2 series · 2 of 2 positions shown · non-contrast
Comparison: 01/03/2015

CLINICAL DATA: Chest pain, headache, shortness of breath for the
past 2 hours.

EXAM:
CHEST  2 VIEW

[w chest lat]
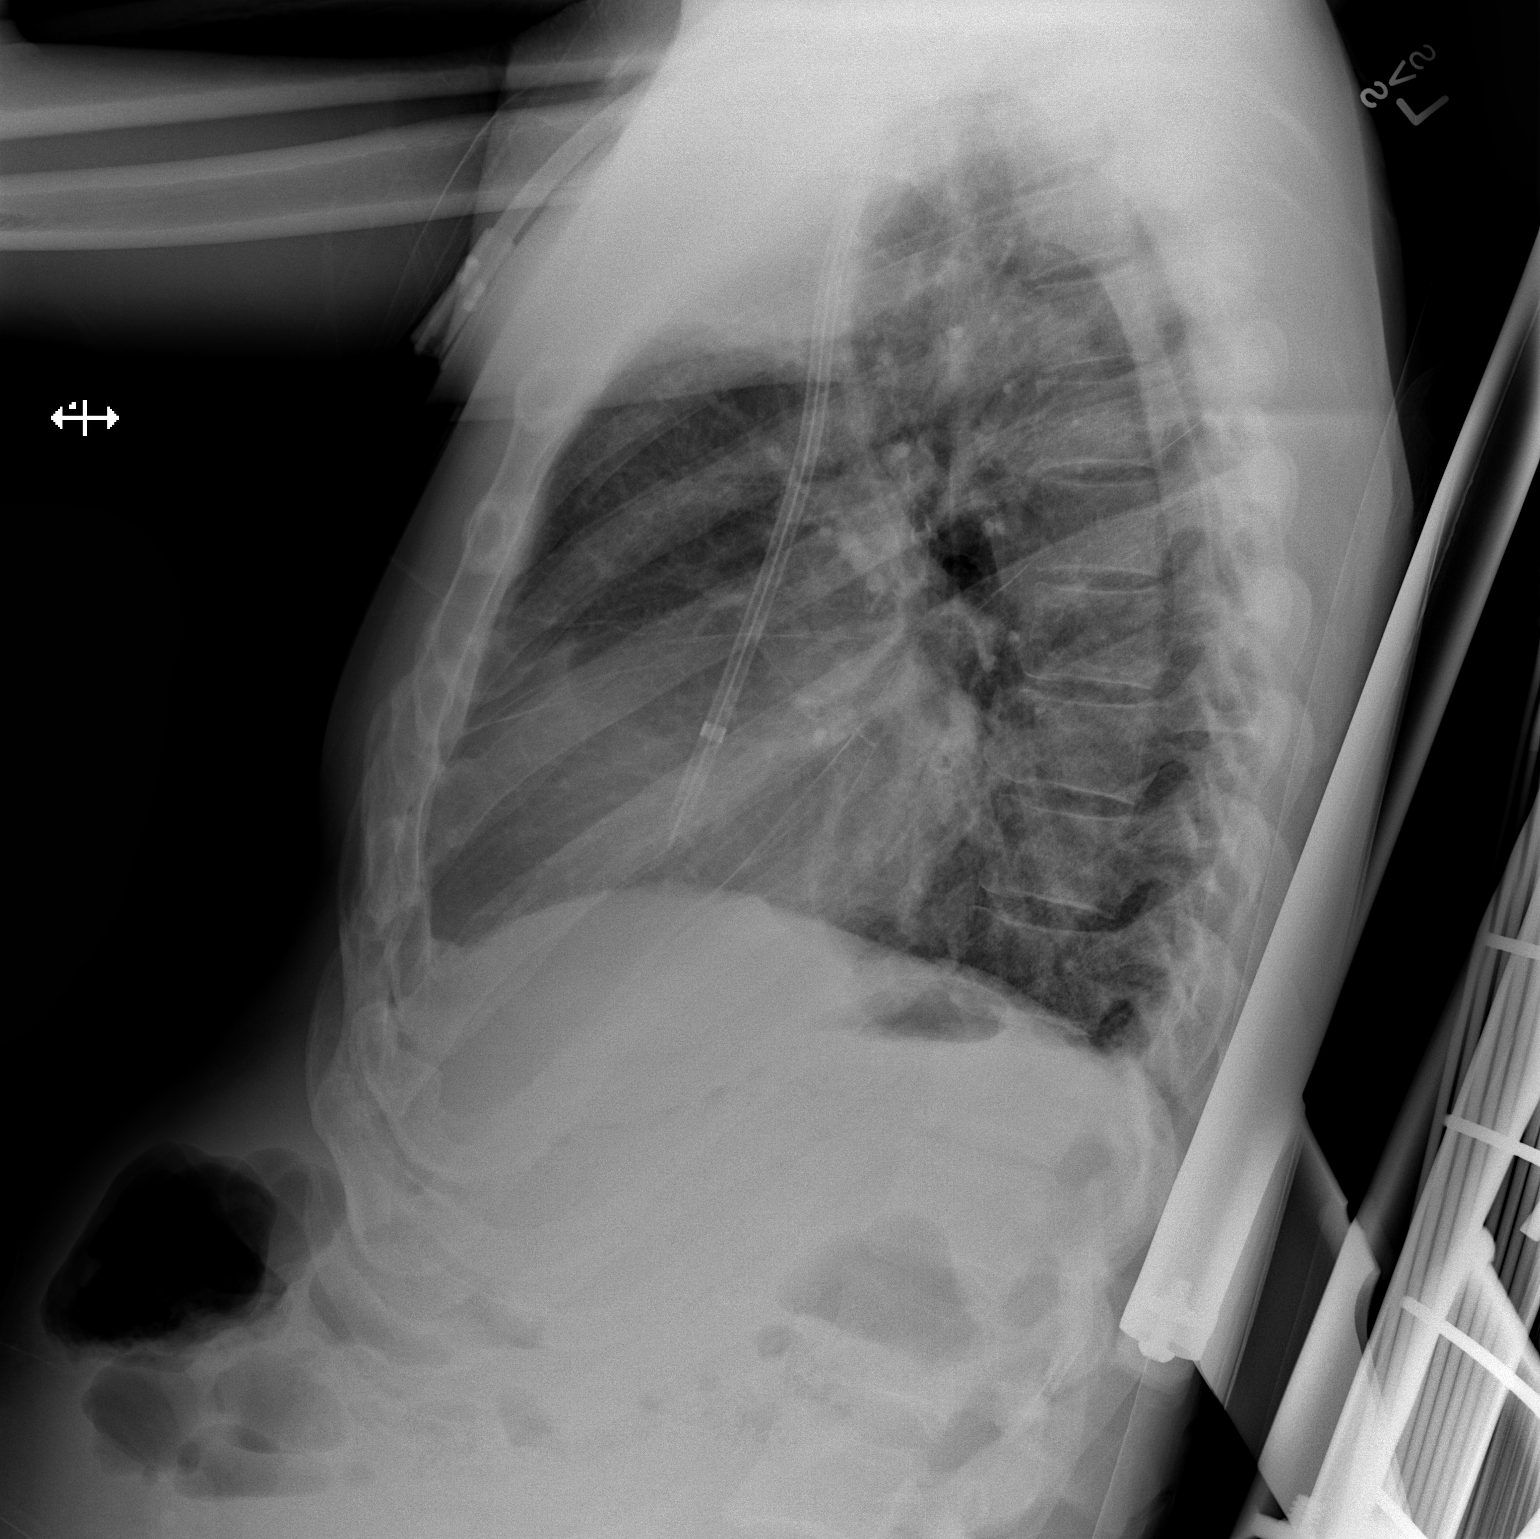

[x chest ap]
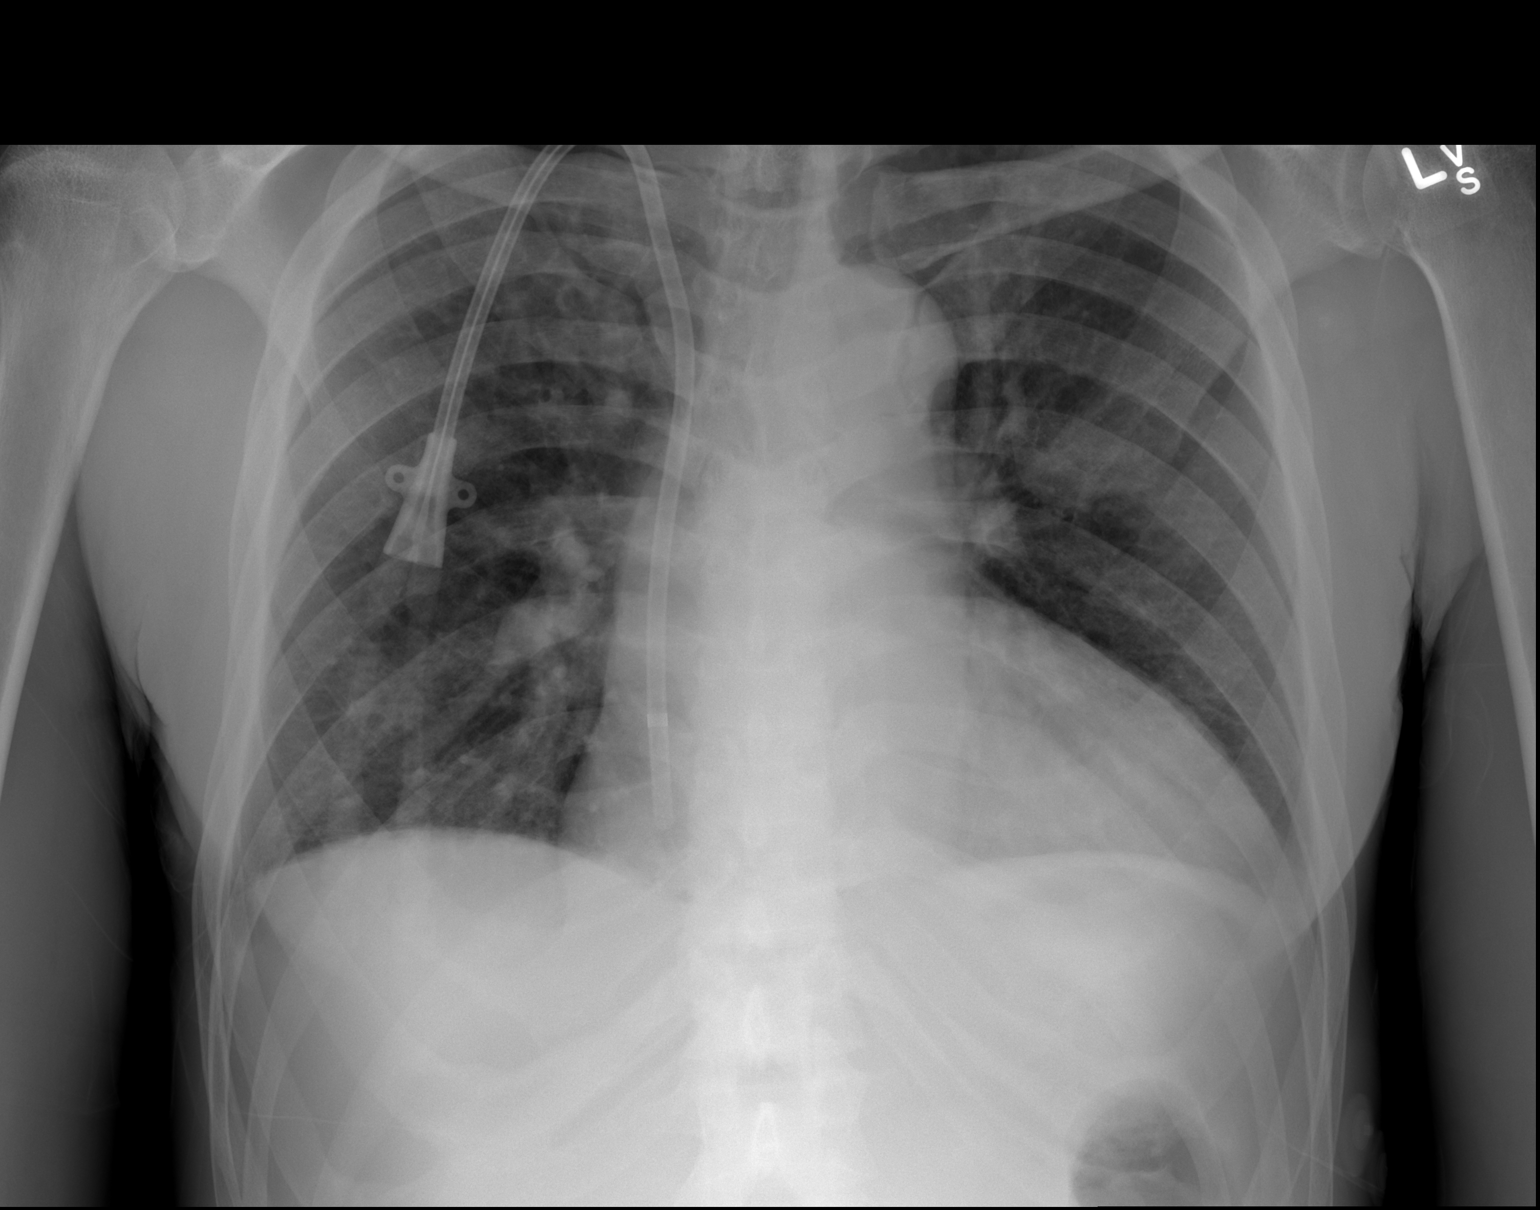

[2 of 2 positions shown; findings below may reference images not displayed]

FINDINGS: Cardiomegaly with no confluent airspace opacities, effusions or
edema. Right dialysis catheter remains in place, unchanged. No acute
bony abnormality.
IMPRESSION: Cardiomegaly.  No active disease.

## 2017-06-01 ENCOUNTER — Emergency Department (HOSPITAL_COMMUNITY)
Admission: EM | Admit: 2017-06-01 | Discharge: 2017-06-02 | Disposition: A | Discharge status: Home / Self Care | Payer: Medicare Other | Attending: Emergency Medicine | Admitting: Emergency Medicine

## 2017-06-01 ENCOUNTER — Encounter (HOSPITAL_COMMUNITY): Payer: Self-pay | Admitting: Emergency Medicine

## 2017-06-01 DIAGNOSIS — N186 End stage renal disease: Secondary | ICD-10-CM | POA: Diagnosis not present

## 2017-06-01 DIAGNOSIS — I132 Hypertensive heart and chronic kidney disease with heart failure and with stage 5 chronic kidney disease, or end stage renal disease: Secondary | ICD-10-CM | POA: Diagnosis not present

## 2017-06-01 DIAGNOSIS — R1013 Epigastric pain: Secondary | ICD-10-CM | POA: Diagnosis not present

## 2017-06-01 DIAGNOSIS — Z8719 Personal history of other diseases of the digestive system: Secondary | ICD-10-CM | POA: Diagnosis not present

## 2017-06-01 DIAGNOSIS — I5042 Chronic combined systolic (congestive) and diastolic (congestive) heart failure: Secondary | ICD-10-CM | POA: Insufficient documentation

## 2017-06-01 DIAGNOSIS — Z79899 Other long term (current) drug therapy: Secondary | ICD-10-CM | POA: Insufficient documentation

## 2017-06-01 DIAGNOSIS — Z87891 Personal history of nicotine dependence: Secondary | ICD-10-CM | POA: Insufficient documentation

## 2017-06-01 DIAGNOSIS — E119 Type 2 diabetes mellitus without complications: Secondary | ICD-10-CM | POA: Insufficient documentation

## 2017-06-01 DIAGNOSIS — Z992 Dependence on renal dialysis: Secondary | ICD-10-CM | POA: Insufficient documentation

## 2017-06-01 DIAGNOSIS — Z7901 Long term (current) use of anticoagulants: Secondary | ICD-10-CM | POA: Diagnosis not present

## 2017-06-01 NOTE — ED Triage Notes (Signed)
Patient reports chronic mid abdominal pain ( pancreatitis ) worsened  this weekend with emesis , denies diarrhea or fever .

## 2017-06-02 LAB — CBC
HEMATOCRIT: 33.5 % — AB (ref 39.0–52.0)
Hemoglobin: 10.8 g/dL — ABNORMAL LOW (ref 13.0–17.0)
MCH: 28.9 pg (ref 26.0–34.0)
MCHC: 32.2 g/dL (ref 30.0–36.0)
MCV: 89.6 fL (ref 78.0–100.0)
Platelets: 176 10*3/uL (ref 150–400)
RBC: 3.74 MIL/uL — ABNORMAL LOW (ref 4.22–5.81)
RDW: 17.4 % — AB (ref 11.5–15.5)
WBC: 4.6 10*3/uL (ref 4.0–10.5)

## 2017-06-02 LAB — COMPREHENSIVE METABOLIC PANEL
ALBUMIN: 3.4 g/dL — AB (ref 3.5–5.0)
ALT: 11 U/L — ABNORMAL LOW (ref 17–63)
AST: 18 U/L (ref 15–41)
Alkaline Phosphatase: 124 U/L (ref 38–126)
Anion gap: 15 (ref 5–15)
BUN: 66 mg/dL — AB (ref 6–20)
CHLORIDE: 96 mmol/L — AB (ref 101–111)
CO2: 26 mmol/L (ref 22–32)
Calcium: 8.2 mg/dL — ABNORMAL LOW (ref 8.9–10.3)
Creatinine, Ser: 13.07 mg/dL — ABNORMAL HIGH (ref 0.61–1.24)
GFR calc Af Amer: 4 mL/min — ABNORMAL LOW (ref >60)
GFR calc non Af Amer: 4 mL/min — ABNORMAL LOW (ref >60)
GLUCOSE: 84 mg/dL (ref 65–99)
POTASSIUM: 5.4 mmol/L — AB (ref 3.5–5.1)
SODIUM: 137 mmol/L (ref 135–145)
Total Bilirubin: 1 mg/dL (ref 0.3–1.2)
Total Protein: 7.4 g/dL (ref 6.5–8.1)

## 2017-06-02 LAB — LIPASE, BLOOD: LIPASE: 27 U/L (ref 11–51)

## 2017-06-02 MED ORDER — OXYCODONE HCL 5 MG PO TABS
10.0000 mg | ORAL_TABLET | Freq: Once | ORAL | Status: AC
  Administered 2017-06-02: 10 mg via ORAL
  Filled 2017-06-02: qty 2

## 2017-06-02 MED ORDER — ONDANSETRON 4 MG PO TBDP
8.0000 mg | ORAL_TABLET | Freq: Once | ORAL | Status: AC
  Administered 2017-06-02: 8 mg via ORAL
  Filled 2017-06-02: qty 2

## 2017-06-02 MED ORDER — IPRATROPIUM-ALBUTEROL 0.5-2.5 (3) MG/3ML IN SOLN
3.0000 mL | Freq: Once | RESPIRATORY_TRACT | Status: AC
  Administered 2017-06-02: 3 mL via RESPIRATORY_TRACT
  Filled 2017-06-02: qty 3

## 2017-06-02 NOTE — ED Notes (Signed)
ED Provider at bedside. 

## 2017-06-02 NOTE — ED Notes (Signed)
Nurse unsuccessful with IV stick. Will inform patient's nurse

## 2017-06-02 NOTE — Discharge Instructions (Signed)
Follow-up with dialysis today as scheduled.

## 2017-06-02 NOTE — ED Provider Notes (Signed)
[facility] EMERGENCY DEPARTMENT Provider Note   CSN: [id number] Arrival date & time: 06/01/17  2350     History   Chief Complaint Chief Complaint  Patient presents with  . Abdominal Pain    HPI Frank [name] is a 51 y.o. male.  Patient is a 51 year old male well-known to the emergency department.  He presents today with complaints of epigastric pain.  He reports a history of chronic pancreatitis and believes the pain is related to this.  It has been unrelieved with home medications.  He tells me his last dialysis session was Friday, however stopped 30 minutes short related to his abdominal pain.   The history is provided by the patient.  Abdominal Pain   This is a chronic problem. The current episode started 2 days ago. The problem occurs constantly. The problem has been rapidly worsening. The pain is associated with an unknown factor. The pain is located in the epigastric region. The quality of the pain is cramping. The pain is severe. Associated symptoms include anorexia. Pertinent negatives include fever and constipation. The symptoms are aggravated by certain positions. Nothing relieves the symptoms.    Past Medical History:  Diagnosis Date  . Anemia   . Anxiety   . Chronic combined systolic and diastolic CHF (congestive heart failure) (HCC)    a. EF 20-25% by echo in 08/2015 b. echo 10/2015: EF 35-40%, diffuse HK, severe LAE, moderate RAE, small pericardial effusion  . Complication of anesthesia    itching, sore throat  . Depression   . Dialysis patient ([place])   . DVT (deep venous thrombosis) ([place]) 02/2017  . ESRD (end stage renal disease) ([place])    due to HTN per patient, followed at [facility], s/p failed kidney transplant - dialysis Tue, Th, Sat  . Hyperkalemia 12/2015  . Hypertension   . Junctional rhythm    a. noted in 08/2015: hyperkalemic at that time  b. 12/2015: presented in junctional rhythm w/ K+ of 6.6. Resolved with improvement of K+ levels.    . Nonischemic cardiomyopathy ([place])    a. 08/2014: cath showing minimal CAD, but tortuous arteries noted.   . Personal history of DVT (deep vein thrombosis)/ PE 05/26/2016   In Oct 2015 had small subsemental LUL PE w/o DVT (LE dopplers neg) and was felt to be HD cath related, treated w coumadin.  IN May 2016 had small vein DVT (acute/subacute) in the R basilic/ brachial veins of the RUE, resumed on coumadin.  Had R sided HD cath at that time.    . Renal insufficiency   . Shortness of breath   . Type II diabetes mellitus (HCC)    No history per patient, but remains under history as A1c would not be accurate given on dialysis    Patient Active Problem List   Diagnosis Date Noted  . ESRD (end stage renal disease) ([place]) 05/26/2017  . Recurrent deep venous thrombosis ([place]) 04/27/2017  . Marijuana abuse 04/21/2017  . Acute DVT (deep venous thrombosis) ([place]) 03/13/2017  . DVT (deep venous thrombosis) ([place]) 03/11/2017  . Anemia 02/24/2017  . Volume overload 01/13/2017  . Aortic atherosclerosis ([place]) 01/05/2017  . Abdominal pain 08/04/2016  . Uremia 06/07/2016  . Hyperkalemia 05/29/2016  . GERD (gastroesophageal reflux disease) 05/29/2016  . Personal history of DVT (deep vein thrombosis)/ PE 05/26/2016  . Nonischemic cardiomyopathy ([place]) 01/09/2016  . Bilateral low back pain without sciatica   . Renal cyst, left 10/30/2015  . Constipation by delayed colonic  transit 10/30/2015  . Acute on chronic combined systolic and diastolic congestive heart failure ([place]) 09/23/2015  . Chest pain 09/08/2015  . Adjustment disorder with mixed anxiety and depressed mood 08/20/2015  . Essential hypertension 01/02/2015  . Dyslipidemia   . Malignant hypertension 11/29/2014  . ESRD on hemodialysis ([place])   . Acute pulmonary edema (HCC)   . DM (diabetes mellitus), type 2, uncontrolled, with renal complications ([place])   . Complex sleep apnea syndrome 05/05/2014  . Anemia of chronic kidney failure 06/24/2013  .  Nausea & vomiting 06/24/2013    Past Surgical History:  Procedure Laterality Date  . CAPD INSERTION    . CAPD REMOVAL    . IR GENERIC HISTORICAL  07/16/2016   IR US GUIDE VASC ACCESS LEFT 07/16/2016 Corrie Mckusick, DO [facility]  . IR GENERIC HISTORICAL Left 07/16/2016   IR THROMBECTOMY AV FISTULA W/THROMBOLYSIS/PTA INC/SHUNT/IMG LEFT 07/16/2016 Corrie Mckusick, DO [facility]INTER[facility]  . KIDNEY RECEIPIENT  2006   failed and started HD in March 2014       Home Medications    Prior to Admission medications   Medication Sig Start Date End Date Taking? Authorizing Provider  amLODipine (NORVASC) 10 MG tablet Take 1 tablet (10 mg total) by mouth at bedtime. 12/01/16   Rice, Resa Miner, MD  carvedilol (COREG) 6.25 MG tablet Take 12.5 mg by mouth 2 (two) times daily. 04/03/17   [provider]  cloNIDine (CATAPRES - DOSED IN MG/24 HR) 0.3 mg/24hr patch Place 0.3 mg onto the skin once a week. On Wednesday  12/31/16   [provider]  dicyclomine (BENTYL) 20 MG tablet Take 20 mg by mouth every 6 (six) hours. 04/26/17   [provider]  gabapentin (NEURONTIN) 100 MG capsule Take 100 mg by mouth at bedtime. 03/20/17   [provider]  hydrALAZINE (APRESOLINE) 100 MG tablet Take 1 tablet (100 mg total) by mouth 2 (two) times daily. 01/07/17   Ledell Noss, MD  isosorbide mononitrate (IMDUR) 60 MG 24 hr tablet Take 1 tablet (60 mg total) by mouth daily. 01/08/17   Ledell Noss, MD  metoCLOPramide (REGLAN) 5 MG tablet Take 1 tablet (5 mg total) by mouth every 8 (eight) hours as needed for nausea or vomiting. 05/01/17   Duffy Bruce, MD  ondansetron (ZOFRAN-ODT) 4 MG disintegrating tablet Take 1 tablet (4 mg total) by mouth every 8 (eight) hours as needed for nausea. 82m ODT q4 hours prn nausea 05/23/17   WRipley Fraise MD  oxyCODONE (ROXICODONE) 5 MG immediate release tablet Take 1 tablet (5 mg total) by mouth every 4 (four) hours as needed for severe pain. 05/01/17   IDuffy Bruce MD  pantoprazole (PROTONIX) 40 MG tablet Take 1 tablet (40 mg total) by mouth 2 (two) times daily before a meal. 12/01/16   Rice, CResa Miner MD  promethazine (PHENERGAN) 25 MG tablet Take 25 mg by mouth every 8 (eight) hours as needed. 04/27/17   [provider]  Respiratory Therapy Supplies MISC 1 each daily. CPAP MACHINE    [provider]  sevelamer carbonate (RENVELA) 800 MG tablet Take 3 tablets (2,400 mg total) by mouth 3 (three) times daily with meals. 04/22/17   VGeradine Girt DO  traMADol (ULTRAM) 50 MG tablet Take 1 tablet (50 mg total) by mouth every 12 (twelve) hours as needed for moderate pain or severe pain. 12/01/16   RCollier Salina MD  warfarin (COUMADIN) 5 MG tablet Take 1.5 tablets (7.5 mg total) by  mouth daily. 04/22/17   [name], DO    Family History Family History  Problem Relation Age of Onset  . Hypertension Other     Social History Social History   Tobacco Use  . Smoking status: Former Smoker    Packs/day: 0.00    Years: 1.00    Pack years: 0.00    Types: Cigarettes  . Smokeless tobacco: Never Used  . Tobacco comment: quit Jan 2014  Substance Use Topics  . Alcohol use: No  . Drug use: Yes    Types: Marijuana     Allergies   Butalbital-apap-caffeine; Ferrlecit [na ferric gluc cplx in sucrose]; Minoxidil; Tylenol [acetaminophen]; and Darvocet [propoxyphene n-acetaminophen]   Review of Systems Review of Systems  Constitutional: Negative for fever.  Gastrointestinal: Positive for abdominal pain and anorexia. Negative for constipation.  All other systems reviewed and are negative.    Physical Exam Updated Vital Signs BP (!) 177/120 (BP Location: Right Arm)   Pulse 73   Temp (!) 97.5 F (36.4 C) (Oral)   Resp 18   SpO2 97%   Physical Exam  Constitutional: He is oriented to person, place, and time. He appears well-developed and well-nourished. No distress.  Patient is a chronically ill-appearing  51 year old male.  He appears uncomfortable.  HENT:  Head: Normocephalic and atraumatic.  Mouth/Throat: Oropharynx is clear and moist.  Neck: Normal range of motion. Neck supple.  Cardiovascular: Normal rate and regular rhythm. Exam reveals no friction rub.  No murmur heard. Pulmonary/Chest: Effort normal and breath sounds normal. No respiratory distress. He has no wheezes. He has no rales.  Abdominal: Soft. Bowel sounds are normal. He exhibits no distension. There is tenderness in the epigastric area. There is guarding. There is no rigidity and no rebound.  There is tenderness to palpation in the epigastric region.  There is voluntary guarding.  Musculoskeletal: Normal range of motion. He exhibits no edema.  Neurological: He is alert and oriented to person, place, and time. Coordination normal.  Skin: Skin is warm and dry. He is not diaphoretic.  Nursing note and vitals reviewed.    ED Treatments / Results  Labs (all labs ordered are listed, but only abnormal results are displayed) Labs Reviewed  LIPASE, BLOOD  COMPREHENSIVE METABOLIC PANEL  CBC    EKG  EKG Interpretation None       Radiology No results found.  Procedures Procedures (including critical care time)  Medications Ordered in ED Medications  ondansetron (ZOFRAN-ODT) disintegrating tablet 8 mg (not administered)  oxyCODONE (Oxy IR/ROXICODONE) immediate release tablet 10 mg (not administered)  ipratropium-albuterol (DUONEB) 0.5-2.5 (3) MG/3ML nebulizer solution 3 mL (not administered)     Initial Impression / Assessment and Plan / ED Course  I have reviewed the triage vital signs and the nursing notes.  Pertinent labs & imaging results that were available during my care of the patient were reviewed by me and considered in my medical decision making (see chart for details).  This patient is well-known to the emergency department for noncompliant dialysis and chronic abdominal pain which he says is related  to his chronic pancreatitis.  Today's lipase is normal.  There is no white count and no laboratory need for emergent dialysis.  His potassium is 5.4 and he is not acidotic.  He does not appear volume overloaded.  He was given oxycodone and Zofran and now appears to be resting very comfortably.  I informed him of his test results and that he would be discharged.  He requested to stay in the department for several more hours so he could "get some sleep".  He became annoyed with me when I informed him that the department was busy and that we needed the rooms.  Final Clinical Impressions(s) / ED Diagnoses   Final diagnoses:  None    ED Discharge Orders    None       Veryl Speak, MD 06/02/17 0301

## 2017-06-02 NOTE — ED Notes (Signed)
Pt departed in NAD. Waiting in front lobby for his sister to pick him up.

## 2017-06-03 DIAGNOSIS — Z992 Dependence on renal dialysis: Secondary | ICD-10-CM | POA: Diagnosis not present

## 2017-06-03 DIAGNOSIS — Z940 Kidney transplant status: Secondary | ICD-10-CM | POA: Diagnosis not present

## 2017-06-03 DIAGNOSIS — R109 Unspecified abdominal pain: Secondary | ICD-10-CM | POA: Diagnosis not present

## 2017-06-03 DIAGNOSIS — E1122 Type 2 diabetes mellitus with diabetic chronic kidney disease: Secondary | ICD-10-CM | POA: Diagnosis not present

## 2017-06-03 DIAGNOSIS — Z79899 Other long term (current) drug therapy: Secondary | ICD-10-CM | POA: Diagnosis not present

## 2017-06-03 DIAGNOSIS — D509 Iron deficiency anemia, unspecified: Secondary | ICD-10-CM | POA: Diagnosis not present

## 2017-06-03 DIAGNOSIS — E8770 Fluid overload, unspecified: Secondary | ICD-10-CM | POA: Diagnosis not present

## 2017-06-03 DIAGNOSIS — Z7901 Long term (current) use of anticoagulants: Secondary | ICD-10-CM | POA: Diagnosis not present

## 2017-06-03 DIAGNOSIS — R1084 Generalized abdominal pain: Secondary | ICD-10-CM | POA: Diagnosis not present

## 2017-06-03 DIAGNOSIS — N2581 Secondary hyperparathyroidism of renal origin: Secondary | ICD-10-CM | POA: Diagnosis not present

## 2017-06-03 DIAGNOSIS — I120 Hypertensive chronic kidney disease with stage 5 chronic kidney disease or end stage renal disease: Secondary | ICD-10-CM | POA: Diagnosis not present

## 2017-06-03 DIAGNOSIS — Z886 Allergy status to analgesic agent status: Secondary | ICD-10-CM | POA: Diagnosis not present

## 2017-06-03 DIAGNOSIS — D631 Anemia in chronic kidney disease: Secondary | ICD-10-CM | POA: Diagnosis not present

## 2017-06-03 DIAGNOSIS — R112 Nausea with vomiting, unspecified: Secondary | ICD-10-CM | POA: Diagnosis not present

## 2017-06-03 DIAGNOSIS — N186 End stage renal disease: Secondary | ICD-10-CM | POA: Diagnosis not present

## 2017-06-03 DIAGNOSIS — G8929 Other chronic pain: Secondary | ICD-10-CM | POA: Diagnosis not present

## 2017-06-03 DIAGNOSIS — Z888 Allergy status to other drugs, medicaments and biological substances status: Secondary | ICD-10-CM | POA: Diagnosis not present

## 2017-06-05 DIAGNOSIS — N186 End stage renal disease: Secondary | ICD-10-CM | POA: Diagnosis not present

## 2017-06-05 DIAGNOSIS — D509 Iron deficiency anemia, unspecified: Secondary | ICD-10-CM | POA: Diagnosis not present

## 2017-06-05 DIAGNOSIS — N2581 Secondary hyperparathyroidism of renal origin: Secondary | ICD-10-CM | POA: Diagnosis not present

## 2017-06-05 DIAGNOSIS — E8770 Fluid overload, unspecified: Secondary | ICD-10-CM | POA: Diagnosis not present

## 2017-06-05 DIAGNOSIS — D631 Anemia in chronic kidney disease: Secondary | ICD-10-CM | POA: Diagnosis not present

## 2017-06-05 IMAGING — US US ABDOMEN LIMITED
1 series · 14 of 25 positions shown · non-contrast
Comparison: CT abdomen and pelvis 09/27/2016

CLINICAL DATA: Abdominal pain for 1 day. History of renal
transplant and inguinal hernia repair. History of dialysis,
diabetes, hypertension, CHF.

EXAM:
US ABDOMEN LIMITED - RIGHT UPPER QUADRANT

[Series 1: us abdomen limited · 0.28mm/px · 14 of 38 slices shown]
[im 1/38]
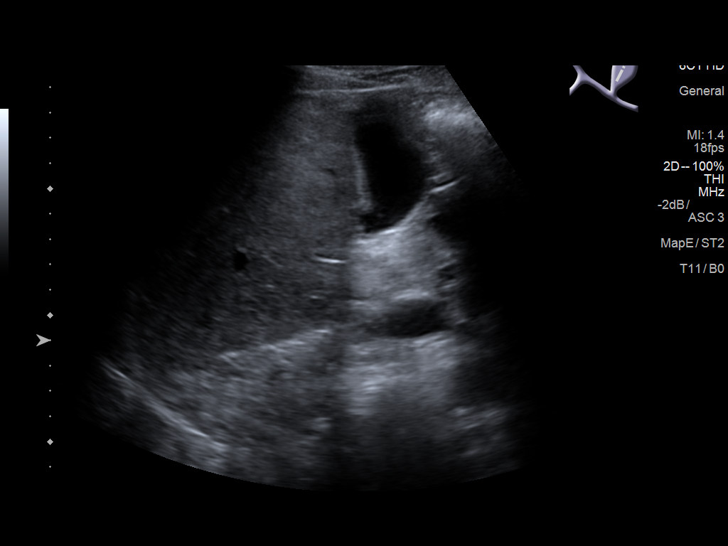
[im 4/38]
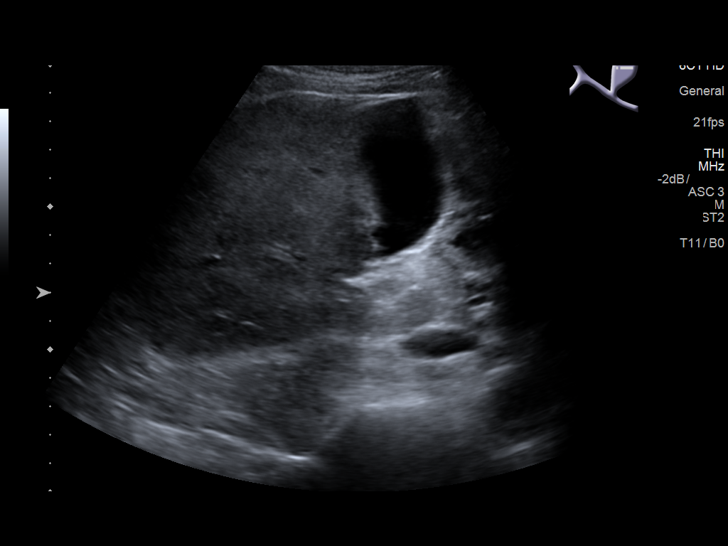
[im 7/38]
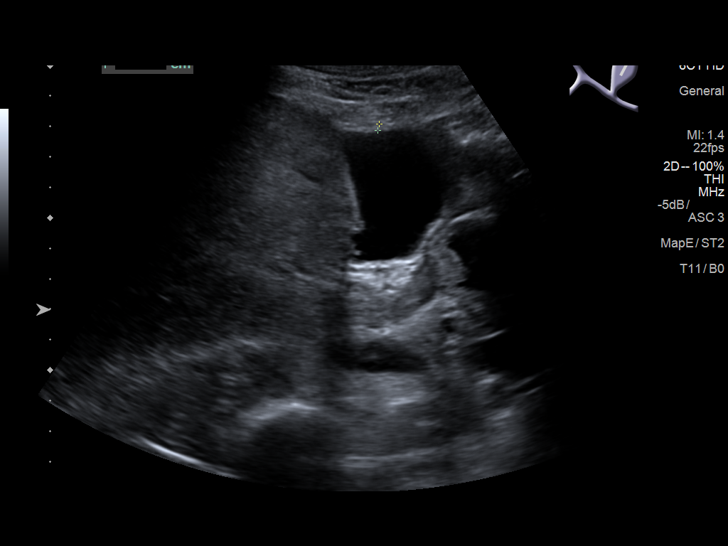
[im 10/38]
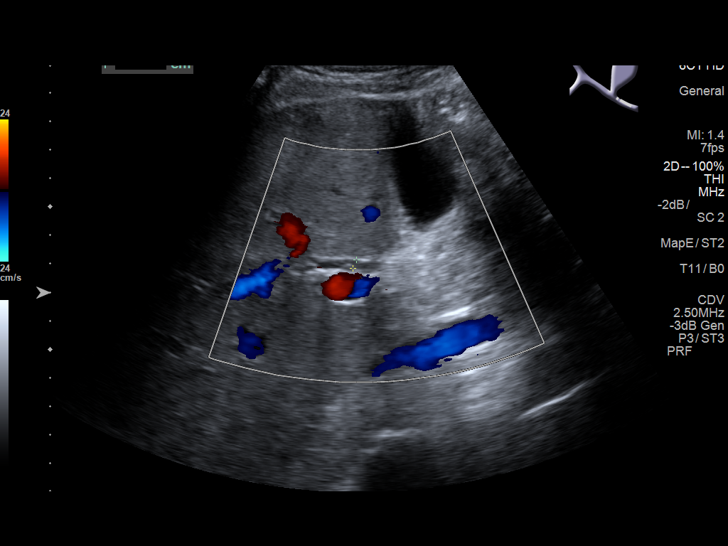
[im 13/38]
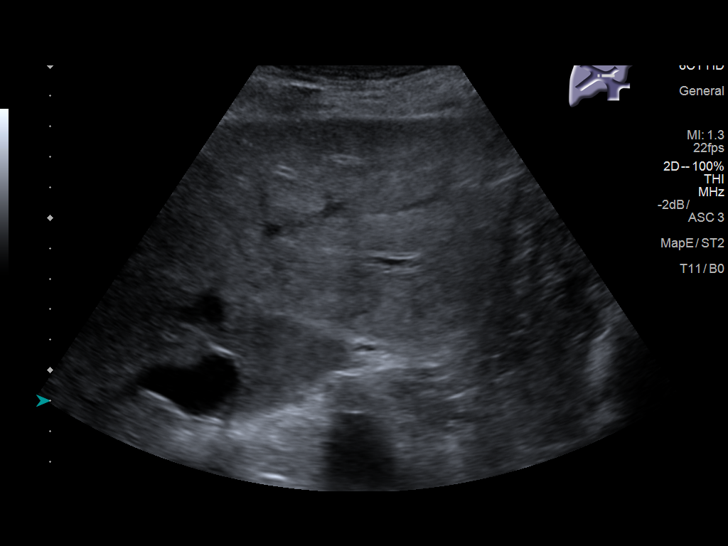
[im 14/38]
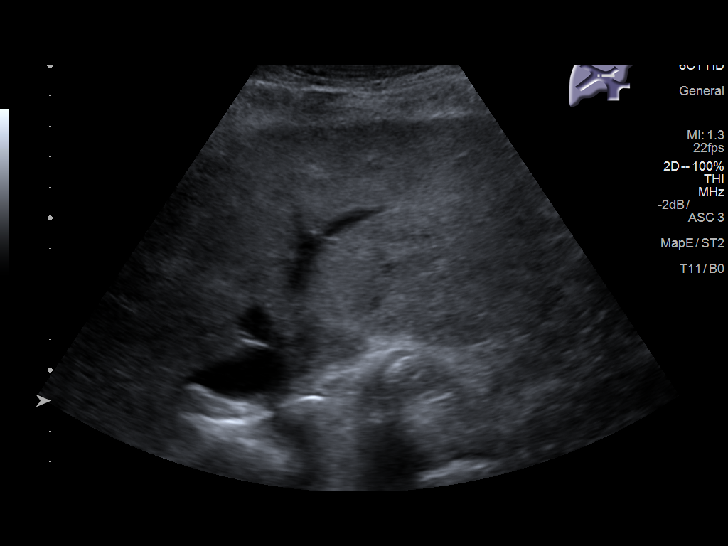
[im 17/38]
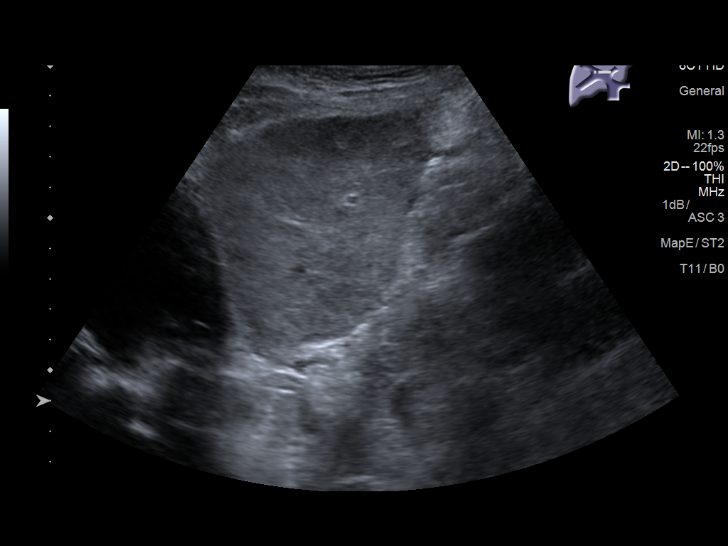
[im 21/38]
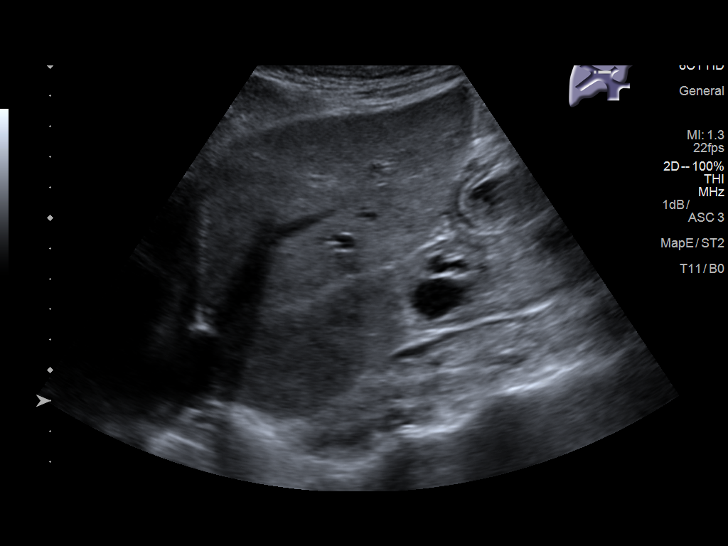
[im 24/38]
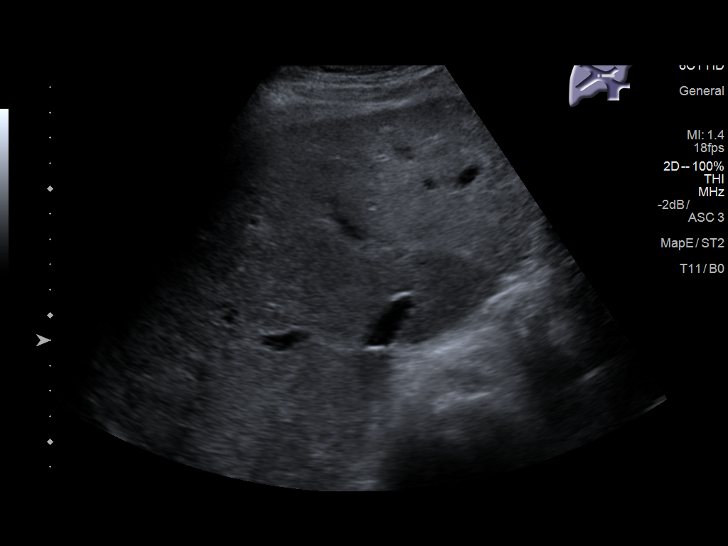
[im 25/38]
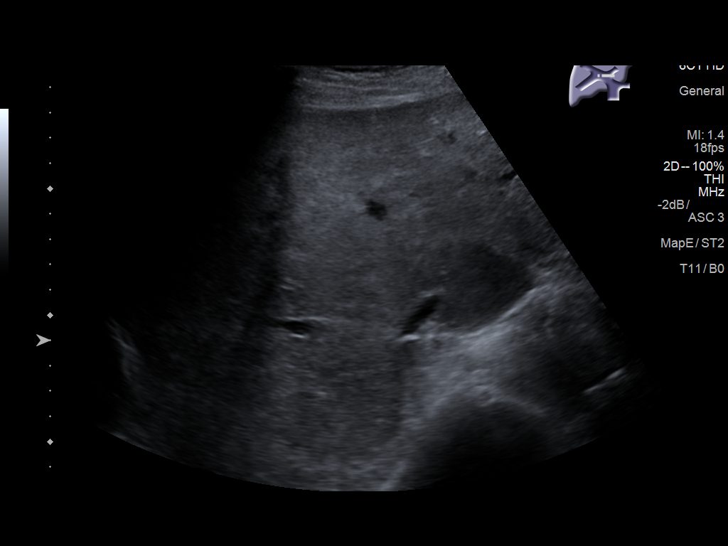
[im 28/38]
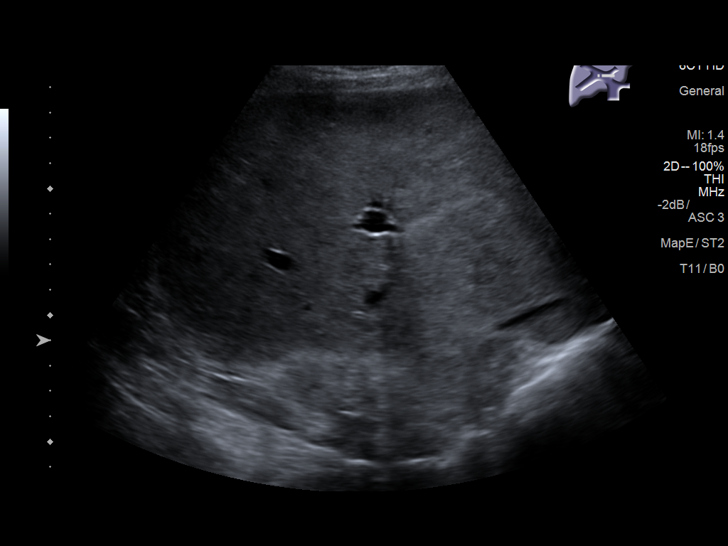
[im 31/38]
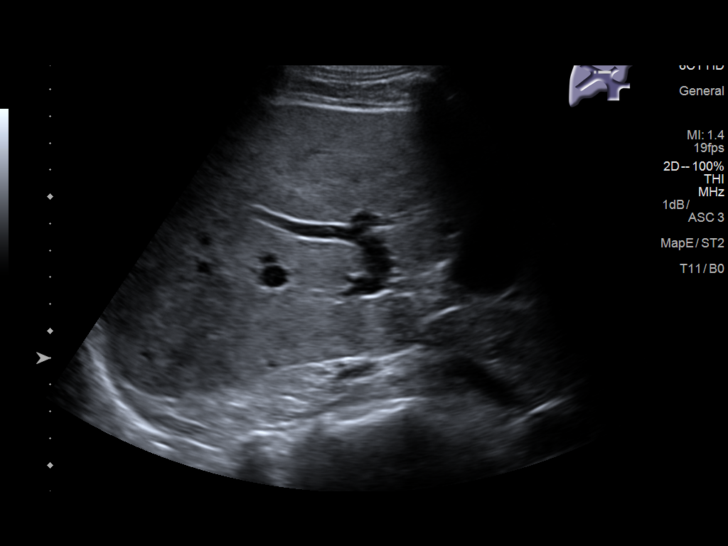
[im 34/38]
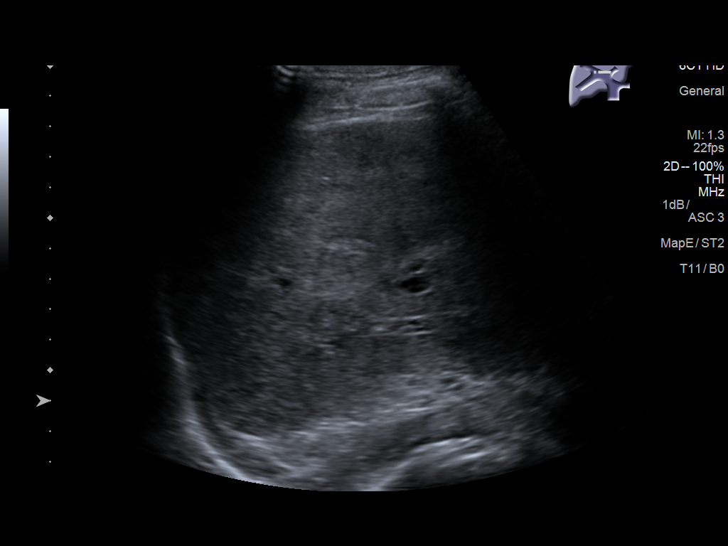
[im 38/38]
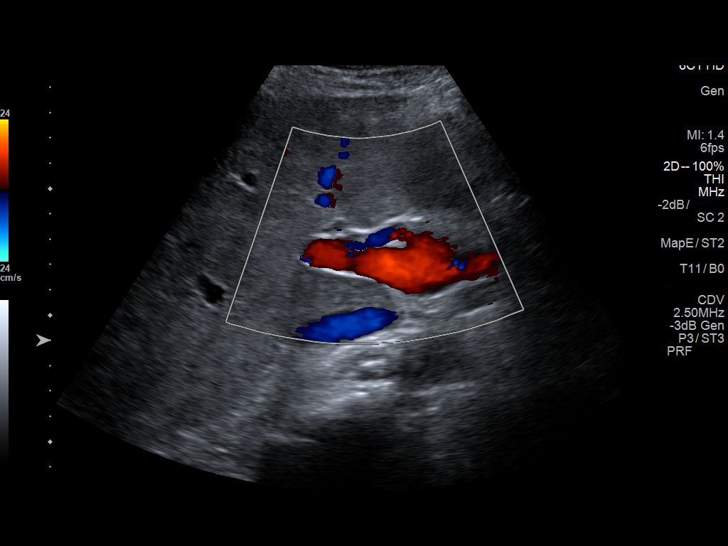

[14 of 25 positions shown; findings below may reference images not displayed]

FINDINGS: Gallbladder:

No gallstones or wall thickening visualized. No sonographic Murphy
sign noted by sonographer.

Common bile duct:

Diameter: 3.2 mm, normal

Liver:

No focal lesion identified. Within normal limits in parenchymal
echogenicity.

Incidental note of right renal atrophy.
IMPRESSION: No evidence of cholelithiasis or cholecystitis. Right renal atrophy.

## 2017-06-08 DIAGNOSIS — J189 Pneumonia, unspecified organism: Secondary | ICD-10-CM | POA: Diagnosis not present

## 2017-06-08 DIAGNOSIS — I517 Cardiomegaly: Secondary | ICD-10-CM | POA: Diagnosis not present

## 2017-06-08 DIAGNOSIS — Z79899 Other long term (current) drug therapy: Secondary | ICD-10-CM | POA: Diagnosis not present

## 2017-06-08 DIAGNOSIS — E119 Type 2 diabetes mellitus without complications: Secondary | ICD-10-CM | POA: Diagnosis not present

## 2017-06-08 DIAGNOSIS — R918 Other nonspecific abnormal finding of lung field: Secondary | ICD-10-CM | POA: Diagnosis not present

## 2017-06-08 DIAGNOSIS — R110 Nausea: Secondary | ICD-10-CM | POA: Diagnosis not present

## 2017-06-08 DIAGNOSIS — R05 Cough: Secondary | ICD-10-CM | POA: Diagnosis not present

## 2017-06-08 DIAGNOSIS — N186 End stage renal disease: Secondary | ICD-10-CM | POA: Diagnosis not present

## 2017-06-08 DIAGNOSIS — G43A0 Cyclical vomiting, not intractable: Secondary | ICD-10-CM | POA: Diagnosis not present

## 2017-06-08 DIAGNOSIS — I10 Essential (primary) hypertension: Secondary | ICD-10-CM | POA: Diagnosis not present

## 2017-06-08 DIAGNOSIS — Z992 Dependence on renal dialysis: Secondary | ICD-10-CM | POA: Diagnosis not present

## 2017-06-08 DIAGNOSIS — R9431 Abnormal electrocardiogram [ECG] [EKG]: Secondary | ICD-10-CM | POA: Diagnosis not present

## 2017-06-09 DIAGNOSIS — D631 Anemia in chronic kidney disease: Secondary | ICD-10-CM | POA: Diagnosis not present

## 2017-06-09 DIAGNOSIS — N186 End stage renal disease: Secondary | ICD-10-CM | POA: Diagnosis not present

## 2017-06-09 DIAGNOSIS — N2581 Secondary hyperparathyroidism of renal origin: Secondary | ICD-10-CM | POA: Diagnosis not present

## 2017-06-09 DIAGNOSIS — D509 Iron deficiency anemia, unspecified: Secondary | ICD-10-CM | POA: Diagnosis not present

## 2017-06-09 DIAGNOSIS — E8770 Fluid overload, unspecified: Secondary | ICD-10-CM | POA: Diagnosis not present

## 2017-06-11 DIAGNOSIS — N186 End stage renal disease: Secondary | ICD-10-CM | POA: Diagnosis not present

## 2017-06-11 DIAGNOSIS — E8770 Fluid overload, unspecified: Secondary | ICD-10-CM | POA: Diagnosis not present

## 2017-06-11 DIAGNOSIS — D509 Iron deficiency anemia, unspecified: Secondary | ICD-10-CM | POA: Diagnosis not present

## 2017-06-11 DIAGNOSIS — N2581 Secondary hyperparathyroidism of renal origin: Secondary | ICD-10-CM | POA: Diagnosis not present

## 2017-06-11 DIAGNOSIS — D631 Anemia in chronic kidney disease: Secondary | ICD-10-CM | POA: Diagnosis not present

## 2017-06-12 ENCOUNTER — Other Ambulatory Visit: Payer: Self-pay

## 2017-06-12 ENCOUNTER — Emergency Department (HOSPITAL_COMMUNITY)
Admission: EM | Admit: 2017-06-12 | Discharge: 2017-06-13 | Disposition: A | Discharge status: Home / Self Care | Payer: Medicare Other | Attending: Emergency Medicine | Admitting: Emergency Medicine

## 2017-06-12 ENCOUNTER — Encounter (HOSPITAL_COMMUNITY): Payer: Self-pay | Admitting: Emergency Medicine

## 2017-06-12 DIAGNOSIS — R109 Unspecified abdominal pain: Secondary | ICD-10-CM | POA: Diagnosis not present

## 2017-06-12 DIAGNOSIS — G8929 Other chronic pain: Secondary | ICD-10-CM | POA: Insufficient documentation

## 2017-06-12 DIAGNOSIS — D631 Anemia in chronic kidney disease: Secondary | ICD-10-CM | POA: Insufficient documentation

## 2017-06-12 DIAGNOSIS — R1013 Epigastric pain: Secondary | ICD-10-CM | POA: Diagnosis present

## 2017-06-12 DIAGNOSIS — Z79899 Other long term (current) drug therapy: Secondary | ICD-10-CM | POA: Insufficient documentation

## 2017-06-12 DIAGNOSIS — R9431 Abnormal electrocardiogram [ECG] [EKG]: Secondary | ICD-10-CM | POA: Diagnosis not present

## 2017-06-12 DIAGNOSIS — R112 Nausea with vomiting, unspecified: Secondary | ICD-10-CM | POA: Diagnosis not present

## 2017-06-12 DIAGNOSIS — Z992 Dependence on renal dialysis: Secondary | ICD-10-CM | POA: Diagnosis not present

## 2017-06-12 DIAGNOSIS — E1122 Type 2 diabetes mellitus with diabetic chronic kidney disease: Secondary | ICD-10-CM | POA: Insufficient documentation

## 2017-06-12 DIAGNOSIS — I509 Heart failure, unspecified: Secondary | ICD-10-CM | POA: Diagnosis not present

## 2017-06-12 DIAGNOSIS — I132 Hypertensive heart and chronic kidney disease with heart failure and with stage 5 chronic kidney disease, or end stage renal disease: Secondary | ICD-10-CM | POA: Diagnosis not present

## 2017-06-12 DIAGNOSIS — N186 End stage renal disease: Secondary | ICD-10-CM | POA: Diagnosis not present

## 2017-06-12 DIAGNOSIS — Z87891 Personal history of nicotine dependence: Secondary | ICD-10-CM | POA: Insufficient documentation

## 2017-06-12 DIAGNOSIS — K861 Other chronic pancreatitis: Secondary | ICD-10-CM | POA: Diagnosis not present

## 2017-06-12 DIAGNOSIS — Z7901 Long term (current) use of anticoagulants: Secondary | ICD-10-CM | POA: Insufficient documentation

## 2017-06-12 LAB — COMPREHENSIVE METABOLIC PANEL
ALK PHOS: 105 U/L (ref 38–126)
ALT: 13 U/L — AB (ref 17–63)
ANION GAP: 15 (ref 5–15)
AST: 26 U/L (ref 15–41)
Albumin: 3.1 g/dL — ABNORMAL LOW (ref 3.5–5.0)
BUN: 67 mg/dL — ABNORMAL HIGH (ref 6–20)
CALCIUM: 8.8 mg/dL — AB (ref 8.9–10.3)
CO2: 26 mmol/L (ref 22–32)
CREATININE: 12.6 mg/dL — AB (ref 0.61–1.24)
Chloride: 97 mmol/L — ABNORMAL LOW (ref 101–111)
GFR, EST AFRICAN AMERICAN: 5 mL/min — AB (ref >60)
GFR, EST NON AFRICAN AMERICAN: 4 mL/min — AB (ref >60)
Glucose, Bld: 112 mg/dL — ABNORMAL HIGH (ref 65–99)
Potassium: 5.1 mmol/L (ref 3.5–5.1)
Sodium: 138 mmol/L (ref 135–145)
TOTAL PROTEIN: 7.3 g/dL (ref 6.5–8.1)
Total Bilirubin: 0.8 mg/dL (ref 0.3–1.2)

## 2017-06-12 LAB — CBC
HCT: 33.2 % — ABNORMAL LOW (ref 39.0–52.0)
Hemoglobin: 10.7 g/dL — ABNORMAL LOW (ref 13.0–17.0)
MCH: 28.8 pg (ref 26.0–34.0)
MCHC: 32.2 g/dL (ref 30.0–36.0)
MCV: 89.2 fL (ref 78.0–100.0)
PLATELETS: 184 10*3/uL (ref 150–400)
RBC: 3.72 MIL/uL — AB (ref 4.22–5.81)
RDW: 17.7 % — ABNORMAL HIGH (ref 11.5–15.5)
WBC: 5.3 10*3/uL (ref 4.0–10.5)

## 2017-06-12 LAB — LIPASE, BLOOD: Lipase: 67 U/L — ABNORMAL HIGH (ref 11–51)

## 2017-06-12 NOTE — ED Triage Notes (Signed)
Pt c/o 10/10 abd pain, HD M,W,F, last HD yesterday, pt states his pancreatitis is flaring up with nausea and vomiting, refusing to have labs done on triage.

## 2017-06-13 ENCOUNTER — Emergency Department (HOSPITAL_COMMUNITY): Payer: Medicare Other

## 2017-06-13 DIAGNOSIS — Z7901 Long term (current) use of anticoagulants: Secondary | ICD-10-CM | POA: Diagnosis not present

## 2017-06-13 DIAGNOSIS — Z9114 Patient's other noncompliance with medication regimen: Secondary | ICD-10-CM | POA: Diagnosis not present

## 2017-06-13 DIAGNOSIS — Z888 Allergy status to other drugs, medicaments and biological substances status: Secondary | ICD-10-CM | POA: Diagnosis not present

## 2017-06-13 DIAGNOSIS — I151 Hypertension secondary to other renal disorders: Secondary | ICD-10-CM | POA: Diagnosis not present

## 2017-06-13 DIAGNOSIS — Z992 Dependence on renal dialysis: Secondary | ICD-10-CM | POA: Diagnosis not present

## 2017-06-13 DIAGNOSIS — R1010 Upper abdominal pain, unspecified: Secondary | ICD-10-CM | POA: Diagnosis not present

## 2017-06-13 DIAGNOSIS — R140 Abdominal distension (gaseous): Secondary | ICD-10-CM | POA: Diagnosis not present

## 2017-06-13 DIAGNOSIS — G43A1 Cyclical vomiting, intractable: Secondary | ICD-10-CM | POA: Diagnosis not present

## 2017-06-13 DIAGNOSIS — E119 Type 2 diabetes mellitus without complications: Secondary | ICD-10-CM | POA: Diagnosis not present

## 2017-06-13 DIAGNOSIS — Z87892 Personal history of anaphylaxis: Secondary | ICD-10-CM | POA: Diagnosis not present

## 2017-06-13 DIAGNOSIS — Z79899 Other long term (current) drug therapy: Secondary | ICD-10-CM | POA: Diagnosis not present

## 2017-06-13 DIAGNOSIS — Z886 Allergy status to analgesic agent status: Secondary | ICD-10-CM | POA: Diagnosis not present

## 2017-06-13 DIAGNOSIS — I4581 Long QT syndrome: Secondary | ICD-10-CM | POA: Diagnosis not present

## 2017-06-13 LAB — BRAIN NATRIURETIC PEPTIDE

## 2017-06-13 LAB — TROPONIN I: TROPONIN I: 0.06 ng/mL — AB (ref <0.03)

## 2017-06-13 LAB — PROTIME-INR
INR: 1.05
Prothrombin Time: 13.6 seconds (ref 11.4–15.2)

## 2017-06-13 MED ORDER — HYDRALAZINE HCL 20 MG/ML IJ SOLN
10.0000 mg | Freq: Once | INTRAMUSCULAR | Status: AC
  Administered 2017-06-13: 10 mg via INTRAVENOUS
  Filled 2017-06-13: qty 1

## 2017-06-13 MED ORDER — IOPAMIDOL (ISOVUE-300) INJECTION 61%
INTRAVENOUS | Status: AC
  Filled 2017-06-13: qty 30

## 2017-06-13 MED ORDER — PROMETHAZINE HCL 25 MG/ML IJ SOLN
25.0000 mg | Freq: Once | INTRAMUSCULAR | Status: AC
  Administered 2017-06-13: 25 mg via INTRAVENOUS
  Filled 2017-06-13: qty 1

## 2017-06-13 MED ORDER — IOPAMIDOL (ISOVUE-300) INJECTION 61%
INTRAVENOUS | Status: AC
  Administered 2017-06-13: 75 mL via INTRAVENOUS
  Filled 2017-06-13: qty 100

## 2017-06-13 MED ORDER — PROMETHAZINE HCL 25 MG PO TABS: 25.0000 mg | ORAL_TABLET | Freq: Four times a day (QID) | ORAL | 0 refills | Status: DC | PRN

## 2017-06-13 MED ORDER — HYDROMORPHONE HCL 1 MG/ML IJ SOLN
1.0000 mg | Freq: Once | INTRAMUSCULAR | Status: AC
  Administered 2017-06-13: 1 mg via INTRAVENOUS
  Filled 2017-06-13: qty 1

## 2017-06-13 NOTE — ED Notes (Signed)
Pt will not keep monitors hooked to him. He keeps taking them off and throwing them in the floor

## 2017-06-13 NOTE — ED Notes (Signed)
Patient transported to X-ray 

## 2017-06-13 NOTE — ED Notes (Signed)
Patient refused to allow discharge vitals.

## 2017-06-13 NOTE — Discharge Instructions (Signed)
Go to dialysis this morning as scheduled.  Follow a clear liquid diet and take your medications as prescribed.  Follow-up with your doctor.  Return to the ED if you develop new or worsening symptoms.

## 2017-06-13 NOTE — ED Notes (Signed)
Pt stated that he does not produce urine.

## 2017-06-13 NOTE — ED Provider Notes (Signed)
[facility] EMERGENCY DEPARTMENT Provider Note   CSN: [id number] Arrival date & time: 06/12/17  2125     History   Chief Complaint Chief Complaint  Patient presents with  . Abdominal Pain    HPI Frank [name] is a 51 y.o. male.  Patient with h/o ESRD, pancreatitis, DVT presents with recurrent epigastric abdominal pain that radiates to his back.  Reports is a constant pain for the past 4-5 days similar to previous episodes of pancreatitis.  He is not been completing his dialysis sessions because dialysis makes the pain worse.  His last dialysis session was yesterday and he stopped 1 hour early.  He is due for dialysis later today.  States he has vomited 4-5 times today.  No diarrhea.  No fever.  No chest pain or shortness of breath.  Taking oxycodone for the pain at home but he is out.  Does not make urine. States he has been taking his blood pressure medications but has not been able to keep them down.  Denies any alcohol use in the past or recently.    The history is provided by the patient.  Abdominal Pain   Associated symptoms include nausea and vomiting. Pertinent negatives include diarrhea, dysuria, hematuria, headaches, arthralgias and myalgias.    Past Medical History:  Diagnosis Date  . Anemia   . Anxiety   . Chronic combined systolic and diastolic CHF (congestive heart failure) (HCC)    a. EF 20-25% by echo in 08/2015 b. echo 10/2015: EF 35-40%, diffuse HK, severe LAE, moderate RAE, small pericardial effusion  . Complication of anesthesia    itching, sore throat  . Depression   . Dialysis patient ([place])   . DVT (deep venous thrombosis) ([place]) 02/2017  . ESRD (end stage renal disease) ([place])    due to HTN per patient, followed at [facility], s/p failed kidney transplant - dialysis Tue, Th, Sat  . Hyperkalemia 12/2015  . Hypertension   . Junctional rhythm    a. noted in 08/2015: hyperkalemic at that time  b. 12/2015: presented in junctional rhythm w/ K+  of 6.6. Resolved with improvement of K+ levels.  . Nonischemic cardiomyopathy ([place])    a. 08/2014: cath showing minimal CAD, but tortuous arteries noted.   . Personal history of DVT (deep vein thrombosis)/ PE 05/26/2016   In Oct 2015 had small subsemental LUL PE w/o DVT (LE dopplers neg) and was felt to be HD cath related, treated w coumadin.  IN May 2016 had small vein DVT (acute/subacute) in the R basilic/ brachial veins of the RUE, resumed on coumadin.  Had R sided HD cath at that time.    . Renal insufficiency   . Shortness of breath   . Type II diabetes mellitus (HCC)    No history per patient, but remains under history as A1c would not be accurate given on dialysis    Patient Active Problem List   Diagnosis Date Noted  . ESRD (end stage renal disease) ([place]) 05/26/2017  . Recurrent deep venous thrombosis ([place]) 04/27/2017  . Marijuana abuse 04/21/2017  . Acute DVT (deep venous thrombosis) ([place]) 03/13/2017  . DVT (deep venous thrombosis) ([place]) 03/11/2017  . Anemia 02/24/2017  . Volume overload 01/13/2017  . Aortic atherosclerosis ([place]) 01/05/2017  . Abdominal pain 08/04/2016  . Uremia 06/07/2016  . Hyperkalemia 05/29/2016  . GERD (gastroesophageal reflux disease) 05/29/2016  . Personal history of DVT (deep vein thrombosis)/ PE 05/26/2016  . Nonischemic cardiomyopathy ([place]) 01/09/2016  .  Bilateral low back pain without sciatica   . Renal cyst, left 10/30/2015  . Constipation by delayed colonic transit 10/30/2015  . Acute on chronic combined systolic and diastolic congestive heart failure ([place]) 09/23/2015  . Chest pain 09/08/2015  . Adjustment disorder with mixed anxiety and depressed mood 08/20/2015  . Essential hypertension 01/02/2015  . Dyslipidemia   . Malignant hypertension 11/29/2014  . ESRD on hemodialysis ([place])   . Acute pulmonary edema (HCC)   . DM (diabetes mellitus), type 2, uncontrolled, with renal complications ([place])   . Complex sleep apnea syndrome 05/05/2014  .  Anemia of chronic kidney failure 06/24/2013  . Nausea & vomiting 06/24/2013    Past Surgical History:  Procedure Laterality Date  . CAPD INSERTION    . CAPD REMOVAL    . INGUINAL HERNIA REPAIR Right 02/14/2015   Procedure: REPAIR INCARCERATED RIGHT INGUINAL HERNIA;  Surgeon: [name], MD;  Location: [facility];  Service: General;  Laterality: Right;  . INSERTION OF DIALYSIS CATHETER Right 09/23/2015   Procedure: exchange of Right internal Dialysis Catheter.;  Surgeon: [name], MD;  Location: [facility];  Service: Vascular;  Laterality: Right;  . IR GENERIC HISTORICAL  07/16/2016   IR US GUIDE VASC ACCESS LEFT 07/16/2016 Corrie Mckusick, DO [facility]-INTER[facility] RAD  . IR GENERIC HISTORICAL Left 07/16/2016   IR THROMBECTOMY AV FISTULA W/THROMBOLYSIS/PTA INC/SHUNT/IMG LEFT 07/16/2016 Corrie Mckusick, DO [facility]  . KIDNEY RECEIPIENT  2006   failed and started HD in March 2014  . LEFT HEART CATHETERIZATION WITH CORONARY ANGIOGRAM N/A 09/02/2014   Procedure: LEFT HEART CATHETERIZATION WITH CORONARY ANGIOGRAM;  Surgeon: [name], MD;  Location[facility] C[facility] LAB;  Service: Cardiovascular;  Laterality: N/A;       Home Medications    Prior to Admission medications   Medication Sig Start Date End Date Taking? Authorizing Provider  amLODipine (NORVASC) 10 MG tablet Take 1 tablet (10 mg total) by mouth at bedtime. 12/01/16   Rice, Resa Miner, MD  carvedilol (COREG) 6.25 MG tablet Take 12.5 mg by mouth 2 (two) times daily. 04/03/17   [provider]  cloNIDine (CATAPRES - DOSED IN MG/24 HR) 0.3 mg/24hr patch Place 0.3 mg onto the skin once a week. On Wednesday  12/31/16   [provider]  dicyclomine (BENTYL) 20 MG tablet Take 20 mg by mouth every 6 (six) hours. 04/26/17   [provider]  gabapentin (NEURONTIN) 100 MG capsule Take 100 mg by mouth at bedtime. 03/20/17   [provider]  hydrALAZINE (APRESOLINE) 100 MG tablet Take 1 tablet (100 mg total) by mouth 2 (two)  times daily. 01/07/17   Ledell Noss, MD  isosorbide mononitrate (IMDUR) 60 MG 24 hr tablet Take 1 tablet (60 mg total) by mouth daily. 01/08/17   Ledell Noss, MD  metoCLOPramide (REGLAN) 5 MG tablet Take 1 tablet (5 mg total) by mouth every 8 (eight) hours as needed for nausea or vomiting. 05/01/17   Duffy Bruce, MD  ondansetron (ZOFRAN-ODT) 4 MG disintegrating tablet Take 1 tablet (4 mg total) by mouth every 8 (eight) hours as needed for nausea. 34m ODT q4 hours prn nausea 05/23/17   WRipley Fraise MD  oxyCODONE (ROXICODONE) 5 MG immediate release tablet Take 1 tablet (5 mg total) by mouth every 4 (four) hours as needed for severe pain. 05/01/17   IDuffy Bruce MD  pantoprazole (PROTONIX) 40 MG tablet Take 1 tablet (40 mg total) by mouth 2 (two) times daily before a meal. 12/01/16  Collier Salina, MD  promethazine (PHENERGAN) 25 MG tablet Take 25 mg by mouth every 8 (eight) hours as needed. 04/27/17   [provider]  Respiratory Therapy Supplies MISC 1 each daily. CPAP MACHINE    [provider]  sevelamer carbonate (RENVELA) 800 MG tablet Take 3 tablets (2,400 mg total) by mouth 3 (three) times daily with meals. 04/22/17   [name], DO  traMADol (ULTRAM) 50 MG tablet Take 1 tablet (50 mg total) by mouth every 12 (twelve) hours as needed for moderate pain or severe pain. 12/01/16   Collier Salina, MD  warfarin (COUMADIN) 5 MG tablet Take 1.5 tablets (7.5 mg total) by mouth daily. 04/22/17   [name], DO    Family History Family History  Problem Relation Age of Onset  . Hypertension Other     Social History Social History   Tobacco Use  . Smoking status: Former Smoker    Packs/day: 0.00    Years: 1.00    Pack years: 0.00    Types: Cigarettes  . Smokeless tobacco: Never Used  . Tobacco comment: quit Jan 2014  Substance Use Topics  . Alcohol use: No  . Drug use: Yes    Types: Marijuana     Allergies   Butalbital-apap-caffeine; Ferrlecit  [na ferric gluc cplx in sucrose]; Minoxidil; Tylenol [acetaminophen]; and Darvocet [propoxyphene n-acetaminophen]   Review of Systems Review of Systems  Constitutional: Positive for activity change and appetite change. Negative for fatigue.  HENT: Negative for congestion and rhinorrhea.   Respiratory: Negative for cough, chest tightness and shortness of breath.   Cardiovascular: Negative for chest pain.  Gastrointestinal: Positive for abdominal pain, nausea and vomiting. Negative for diarrhea.  Genitourinary: Negative for dysuria, hematuria and testicular pain.  Musculoskeletal: Negative for arthralgias and myalgias.  Skin: Negative for wound.  Neurological: Negative for dizziness, weakness and headaches.    all other systems are negative except as noted in the HPI and PMH.    Physical Exam Updated Vital Signs BP (!) 200/130 (BP Location: Right Arm)   Pulse 85   Temp (!) 97.4 F (36.3 C) (Oral)   Ht _0  (1.88 m)   Wt 78 kg (172 lb)   SpO2 100%   BMI 22.08 kg/m   Physical Exam  Constitutional: He is oriented to person, place, and time. He appears well-developed and well-nourished. No distress.  Chronically ill-appearing. Appears comfortable but then moans holding his abdomen  HENT:  Head: Normocephalic and atraumatic.  Mouth/Throat: Oropharynx is clear and moist. No oropharyngeal exudate.  Eyes: Conjunctivae and EOM are normal. Pupils are equal, round, and reactive to light.  Neck: Normal range of motion. Neck supple.  No meningismus.  Cardiovascular: Normal rate, regular rhythm, normal heart sounds and intact distal pulses.  No murmur heard. Pulmonary/Chest: Effort normal and breath sounds normal. No respiratory distress. He exhibits no tenderness.  Abdominal: Soft. There is tenderness. There is guarding. There is no rebound.  Epigastric tenderness with voluntary guarding  Musculoskeletal: Normal range of motion. He exhibits no edema or tenderness.  Dialysis fistula  left lower arm with intact thrill  Neurological: He is alert and oriented to person, place, and time. No cranial nerve deficit. He exhibits normal muscle tone. Coordination normal.  No ataxia on finger to nose bilaterally. No pronator drift. 5/5 strength throughout. CN 2-12 intact.Equal grip strength. Sensation intact.   Skin: Skin is warm.  Psychiatric: He has a normal mood and affect. His behavior is  normal.  Nursing note and vitals reviewed.    ED Treatments / Results  Labs (all labs ordered are listed, but only abnormal results are displayed) Labs Reviewed  LIPASE, BLOOD - Abnormal; Notable for the following components:      Result Value   Lipase 67 (*)    All other components within normal limits  COMPREHENSIVE METABOLIC PANEL - Abnormal; Notable for the following components:   Chloride 97 (*)    Glucose, Bld 112 (*)    BUN 67 (*)    Creatinine, Ser 12.60 (*)    Calcium 8.8 (*)    Albumin 3.1 (*)    ALT 13 (*)    GFR calc non Af Amer 4 (*)    GFR calc Af Amer 5 (*)    All other components within normal limits  CBC - Abnormal; Notable for the following components:   RBC 3.72 (*)    Hemoglobin 10.7 (*)    HCT 33.2 (*)    RDW 17.7 (*)    All other components within normal limits  TROPONIN I - Abnormal; Notable for the following components:   Troponin I 0.06 (*)    All other components within normal limits  BRAIN NATRIURETIC PEPTIDE - Abnormal; Notable for the following components:   B Natriuretic Peptide >4,500.0 (*)    All other components within normal limits  PROTIME-INR   EKG  EKG Interpretation  Date/Time:  Friday June 13 2017 01:49:32 EST Ventricular Rate:  81 PR Interval:    QRS Duration: 106 QT Interval:  433 QTC Calculation: 503 R Axis:   -48 Text Interpretation:  Sinus rhythm Consider left atrial enlargement Left anterior fascicular block LVH with secondary repolarization abnormality Prolonged QT interval Baseline wander in lead(s) V1 No significant  change was found Confirmed by Ezequiel Essex [phone]) on 06/13/2017 2:09:44 AM Also confirmed by Ezequiel Essex [phone]), editor Oswaldo Milian, [place] ([id number])  on 06/13/2017 6:46:31 AM       Radiology Ct Abdomen Pelvis W Contrast  Result Date: 06/13/2017 CLINICAL DATA:  Periumbilical pain, abdominal distention, nausea and vomiting for 4 days. EXAM: CT ABDOMEN AND PELVIS WITH CONTRAST TECHNIQUE: Multidetector CT imaging of the abdomen and pelvis was performed using the standard protocol following bolus administration of intravenous contrast. CONTRAST:  75 mL Isovue-300 COMPARISON:  05/01/2017 FINDINGS: Examination is technically limited due to motion artifact. Lower chest: Cardiac enlargement. Lung bases are clear, line for motion artifact. Hepatobiliary: The liver appears mildly enlarged without focal lesion. Gallbladder and bile ducts are unremarkable. Pancreas: Mild edema around the tail of the pancreas may represent changes of pancreatitis. No loculated collections. No pancreatic ductal dilatation. Spleen: Normal in size without focal abnormality. Adrenals/Urinary Tract: No adrenal gland nodules. Kidneys are diffusely atrophic bilaterally. No hydronephrosis or hydroureter. 3.4 cm diameter exophytic lesion off of the left kidney. Density is heterogeneous and this may represent a solid lesion. The lesion has been present dating back to 03/10/2005 but has slowly grown in the interval. Indeterminate for malignancy. Left pelvic transplant kidney appears diffusely necrotic with prominent vascular calcification. Stomach/Bowel: Stomach and small bowel are decompressed. Decompression limits capacity to evaluate the bowel wall. No evidence of obstruction. Scattered stool throughout the colon. No colonic distention. Appendix is normal. Vascular/Lymphatic: Prominent aorta iliac vascular calcifications. No aortic aneurysm. No significant lymphadenopathy. Reproductive: Prostate is unremarkable. Other: No free air in  the abdomen. Abdominal wall musculature appears intact. Musculoskeletal: Diffusely increased density throughout the visualized bone structures likely representing renal osteodystrophy. No destructive  bone lesions. IMPRESSION: 1. No evidence of bowel obstruction. Difficult to evaluate for inflammatory process due to decompression. 2. Cardiac enlargement. 3. Mild edema around the tail of the pancreas may indicate pancreatitis. No loculated collections. 4. Diffuse bilateral renal atrophy. Non functioning left pelvic transplant kidney. Indeterminate 3.4 cm exophytic lesion off the left kidney. 5. Extensive aortic atherosclerosis. 6. Increased bone density suggesting renal osteodystrophy. Electronically Signed   By: Lucienne Capers M.D.   On: 06/13/2017 06:41   Dg Abdomen Acute W/chest  Result Date: 06/13/2017 CLINICAL DATA:  Upper abdominal pain and nausea and vomiting for couple of days. EXAM: DG ABDOMEN ACUTE W/ 1V CHEST COMPARISON:  Abdomen 05/04/2017 FINDINGS: Cardiac enlargement with mild vascular congestion. No edema or consolidation. No blunting of costophrenic angles. No pneumothorax. Calcification of the aorta. Gas and stool throughout the colon. No small or large bowel distention. No free intra-abdominal air. No radiopaque stones. Vascular calcifications. Visualized bones appear intact. IMPRESSION: Cardiac enlargement with mild vascular congestion. No edema or consolidation. Nonobstructive bowel gas pattern. Aortic atherosclerosis. Electronically Signed   By: Lucienne Capers M.D.   On: 06/13/2017 01:23    Procedures Procedures (including critical care time)  Medications Ordered in ED Medications  HYDROmorphone (DILAUDID) injection 1 mg (not administered)  promethazine (PHENERGAN) injection 25 mg (not administered)     Initial Impression / Assessment and Plan / ED Course  I have reviewed the triage vital signs and the nursing notes.  Pertinent labs & imaging results that were available  during my care of the patient were reviewed by me and considered in my medical decision making (see chart for details).    Patient presents with recurrent epigastric abdominal pain with nausea and vomiting similar to previous episodes of pancreatitis.  Denies chest pain.  EKG is unchanged.  Labs show potassium of 5.1.  Patient due for dialysis in the morning.  Lipase mildly elevated at 67.  The patient's INR is subtherapeutic this is a chronic problem for the patient and he refuses to take his anticoagulation for DVT.  Do not suspect mesenteric ischemia as this is similar to patient's previous presentations for pancreatitis.  Acute abdominal series is negative.  Patient given symptom control as well as his blood pressure medications.  Troponin mildly elevated similar to previous.  Imaging negative for obstruction.  Patient feels pain is similar to previous episodes of pancreatitis.  There is mild edema seen in the pancreas.  No vomiting in the ED.  Pain and nausea are controlled.  Discussed with patient that he is not able to receive narcotic pain medications.  Patient is agreeable and wishes to go to dialysis this morning. He states he does not need any prescriptions and would like to go to dialysis and does not want to be admitted. He is agreeable to follow clear liquid diet, pain control and follow up with his PCP. Return precautions discussed.   BP (!) 160/113   Pulse 80   Temp (!) 97.4 F (36.3 C) (Oral)   Resp 15   Ht _0  (1.88 m)   Wt 78 kg (172 lb)   SpO2 100%   BMI 22.08 kg/m    Final Clinical Impressions(s) / ED Diagnoses   Final diagnoses:  Chronic pancreatitis, unspecified pancreatitis type ([place])  Chronic abdominal pain    ED Discharge Orders    None       Ezequiel Essex, MD 06/13/17 [phone]

## 2017-06-13 NOTE — ED Notes (Signed)
Patient sitting in Chair, refuses to get in bed. Patient given fluids. Patient refuse to be placed on monitor.

## 2017-06-14 DIAGNOSIS — I151 Hypertension secondary to other renal disorders: Secondary | ICD-10-CM | POA: Diagnosis not present

## 2017-06-14 DIAGNOSIS — N2581 Secondary hyperparathyroidism of renal origin: Secondary | ICD-10-CM | POA: Diagnosis not present

## 2017-06-14 DIAGNOSIS — D509 Iron deficiency anemia, unspecified: Secondary | ICD-10-CM | POA: Diagnosis not present

## 2017-06-14 DIAGNOSIS — N186 End stage renal disease: Secondary | ICD-10-CM | POA: Diagnosis not present

## 2017-06-14 DIAGNOSIS — N2889 Other specified disorders of kidney and ureter: Secondary | ICD-10-CM | POA: Diagnosis not present

## 2017-06-14 DIAGNOSIS — E8770 Fluid overload, unspecified: Secondary | ICD-10-CM | POA: Diagnosis not present

## 2017-06-14 DIAGNOSIS — D631 Anemia in chronic kidney disease: Secondary | ICD-10-CM | POA: Diagnosis not present

## 2017-06-15 ENCOUNTER — Other Ambulatory Visit: Payer: Self-pay

## 2017-06-15 ENCOUNTER — Encounter (HOSPITAL_COMMUNITY): Payer: Self-pay | Admitting: *Deleted

## 2017-06-15 DIAGNOSIS — N186 End stage renal disease: Secondary | ICD-10-CM | POA: Diagnosis not present

## 2017-06-15 DIAGNOSIS — G8929 Other chronic pain: Secondary | ICD-10-CM | POA: Insufficient documentation

## 2017-06-15 DIAGNOSIS — R109 Unspecified abdominal pain: Secondary | ICD-10-CM | POA: Insufficient documentation

## 2017-06-15 DIAGNOSIS — Z79899 Other long term (current) drug therapy: Secondary | ICD-10-CM | POA: Diagnosis not present

## 2017-06-15 DIAGNOSIS — Z87891 Personal history of nicotine dependence: Secondary | ICD-10-CM | POA: Insufficient documentation

## 2017-06-15 DIAGNOSIS — I132 Hypertensive heart and chronic kidney disease with heart failure and with stage 5 chronic kidney disease, or end stage renal disease: Secondary | ICD-10-CM | POA: Insufficient documentation

## 2017-06-15 DIAGNOSIS — Z992 Dependence on renal dialysis: Secondary | ICD-10-CM | POA: Insufficient documentation

## 2017-06-15 DIAGNOSIS — I5042 Chronic combined systolic (congestive) and diastolic (congestive) heart failure: Secondary | ICD-10-CM | POA: Diagnosis not present

## 2017-06-15 DIAGNOSIS — Z7901 Long term (current) use of anticoagulants: Secondary | ICD-10-CM | POA: Diagnosis not present

## 2017-06-15 DIAGNOSIS — I82621 Acute embolism and thrombosis of deep veins of right upper extremity: Secondary | ICD-10-CM | POA: Insufficient documentation

## 2017-06-15 DIAGNOSIS — E1122 Type 2 diabetes mellitus with diabetic chronic kidney disease: Secondary | ICD-10-CM | POA: Diagnosis not present

## 2017-06-15 LAB — CBC
HEMATOCRIT: 35 % — AB (ref 39.0–52.0)
Hemoglobin: 11.4 g/dL — ABNORMAL LOW (ref 13.0–17.0)
MCH: 29.1 pg (ref 26.0–34.0)
MCHC: 32.6 g/dL (ref 30.0–36.0)
MCV: 89.3 fL (ref 78.0–100.0)
Platelets: 216 10*3/uL (ref 150–400)
RBC: 3.92 MIL/uL — AB (ref 4.22–5.81)
RDW: 18.1 % — AB (ref 11.5–15.5)
WBC: 5.5 10*3/uL (ref 4.0–10.5)

## 2017-06-15 LAB — COMPREHENSIVE METABOLIC PANEL
ALT: 14 U/L — ABNORMAL LOW (ref 17–63)
ANION GAP: 15 (ref 5–15)
AST: 30 U/L (ref 15–41)
Albumin: 3.3 g/dL — ABNORMAL LOW (ref 3.5–5.0)
Alkaline Phosphatase: 138 U/L — ABNORMAL HIGH (ref 38–126)
BILIRUBIN TOTAL: 0.6 mg/dL (ref 0.3–1.2)
BUN: 64 mg/dL — AB (ref 6–20)
CO2: 24 mmol/L (ref 22–32)
Calcium: 8.6 mg/dL — ABNORMAL LOW (ref 8.9–10.3)
Chloride: 95 mmol/L — ABNORMAL LOW (ref 101–111)
Creatinine, Ser: 11.44 mg/dL — ABNORMAL HIGH (ref 0.61–1.24)
GFR, EST AFRICAN AMERICAN: 5 mL/min — AB (ref >60)
GFR, EST NON AFRICAN AMERICAN: 4 mL/min — AB (ref >60)
Glucose, Bld: 98 mg/dL (ref 65–99)
POTASSIUM: 4.7 mmol/L (ref 3.5–5.1)
Sodium: 134 mmol/L — ABNORMAL LOW (ref 135–145)
TOTAL PROTEIN: 7.7 g/dL (ref 6.5–8.1)

## 2017-06-15 LAB — LIPASE, BLOOD: Lipase: 90 U/L — ABNORMAL HIGH (ref 11–51)

## 2017-06-15 NOTE — ED Notes (Signed)
Pt refused to get his vitals updated

## 2017-06-15 NOTE — ED Triage Notes (Signed)
The pt is c/o abd pain with n and v and he wants to be seen for rt arm pain and swelling since Tuesday.  Dialysis pt that was dialyzed Thursday.  Fistula lt arm

## 2017-06-15 NOTE — ED Triage Notes (Signed)
The pt is having rt arm pain and swelling  Good radial pulse rt arm

## 2017-06-16 ENCOUNTER — Emergency Department (HOSPITAL_COMMUNITY)
Admission: EM | Admit: 2017-06-16 | Discharge: 2017-06-16 | Disposition: A | Discharge status: Home / Self Care | Payer: Medicare Other | Attending: Emergency Medicine | Admitting: Emergency Medicine

## 2017-06-16 DIAGNOSIS — I82621 Acute embolism and thrombosis of deep veins of right upper extremity: Secondary | ICD-10-CM

## 2017-06-16 DIAGNOSIS — R109 Unspecified abdominal pain: Secondary | ICD-10-CM

## 2017-06-16 DIAGNOSIS — G8929 Other chronic pain: Secondary | ICD-10-CM

## 2017-06-16 LAB — PROTIME-INR
INR: 1.1
Prothrombin Time: 14.1 seconds (ref 11.4–15.2)

## 2017-06-16 IMAGING — CR DG CHEST 1V PORT
1 series · 1 of 1 positions shown · non-contrast
Comparison: 01/09/2015

CLINICAL DATA: Central chest pain

EXAM:
PORTABLE CHEST - 1 VIEW

[AP]
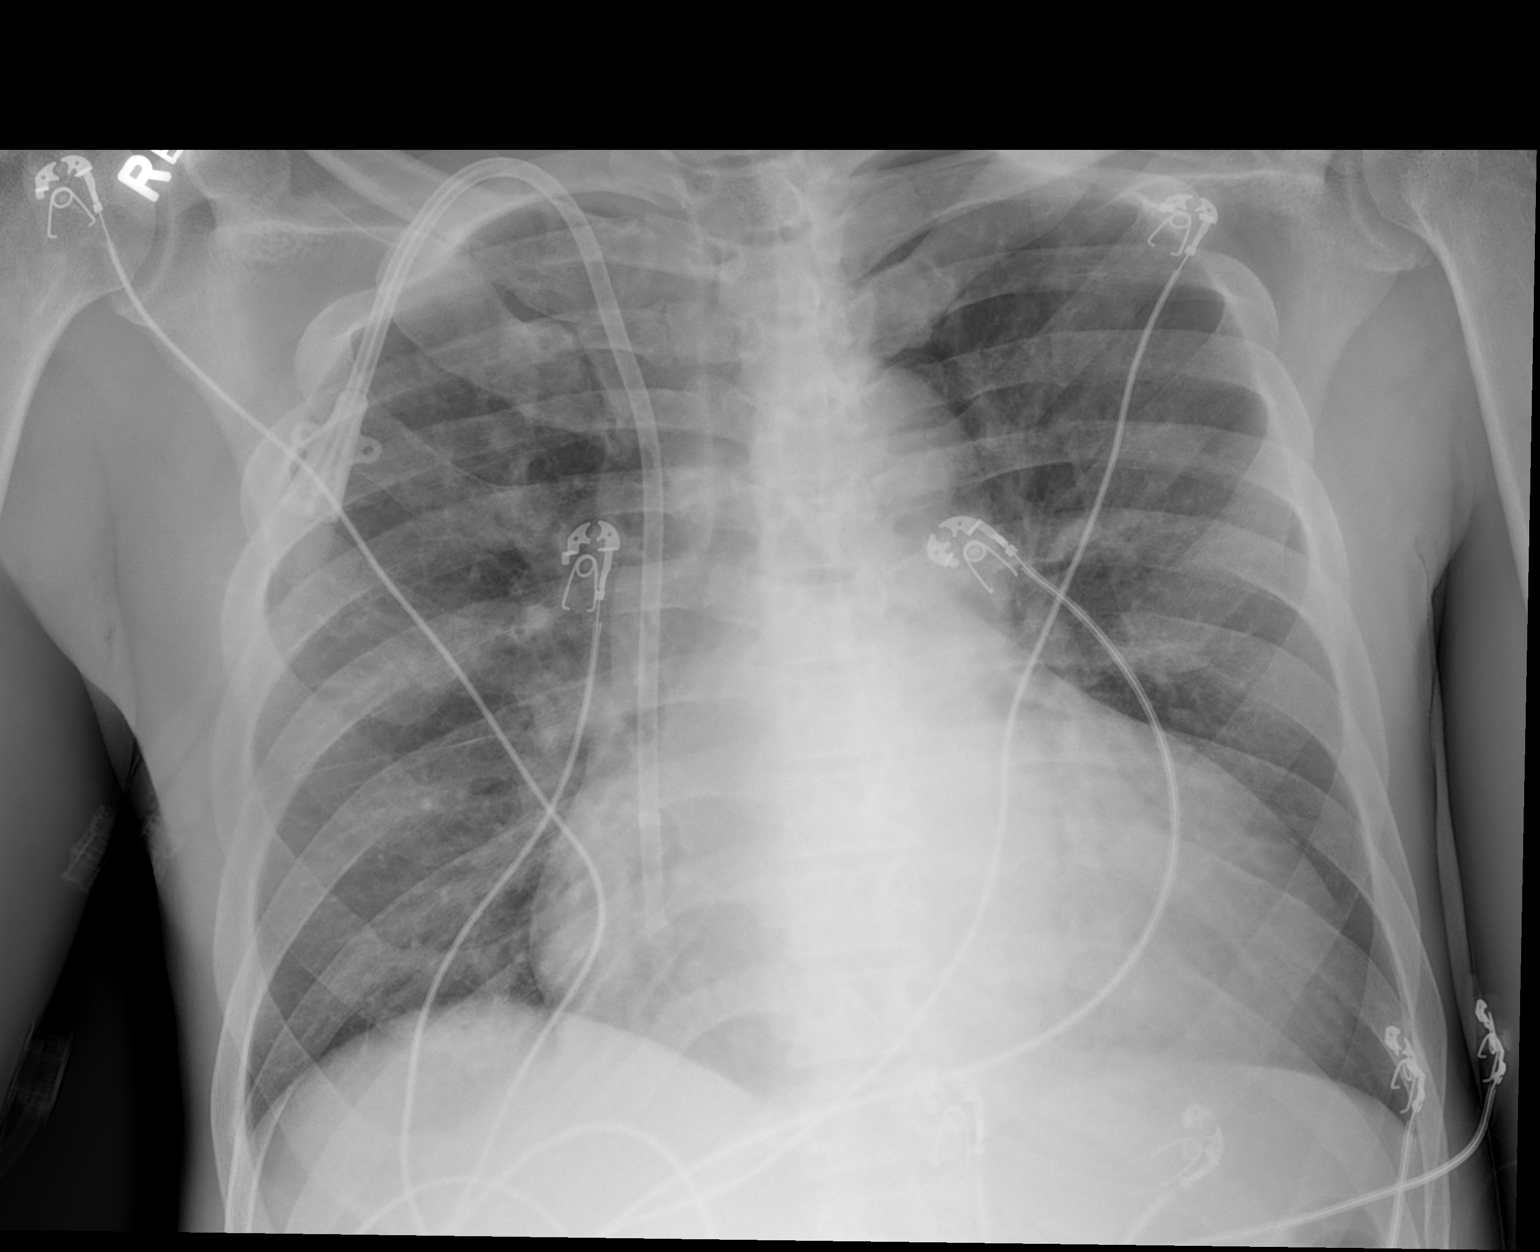

[1 of 1 positions shown; findings below may reference images not displayed]

FINDINGS: Dialysis catheter from right IJ approach is in stable position, tip
at the right atrium. Stable cardiomegaly. Stable aortic and hilar
contours.

Baseline appearance of the lungs. There is no edema, consolidation,
effusion, or pneumothorax.

No acute osseous findings.
IMPRESSION: Stable exam.  No acute cardiopulmonary disease.

## 2017-06-16 MED ORDER — OXYCODONE HCL 5 MG PO TABS
10.0000 mg | ORAL_TABLET | Freq: Once | ORAL | Status: AC
  Administered 2017-06-16: 10 mg via ORAL
  Filled 2017-06-16: qty 2

## 2017-06-16 MED ORDER — WARFARIN SODIUM 10 MG PO TABS
10.0000 mg | ORAL_TABLET | Freq: Once | ORAL | Status: AC
  Administered 2017-06-16: 10 mg via ORAL
  Filled 2017-06-16: qty 1

## 2017-06-16 MED ORDER — METOCLOPRAMIDE HCL 5 MG/ML IJ SOLN
10.0000 mg | Freq: Once | INTRAMUSCULAR | Status: AC
Start: 2017-06-16 — End: 2017-06-16
  Administered 2017-06-16: 10 mg via INTRAMUSCULAR
  Filled 2017-06-16: qty 2

## 2017-06-16 MED ORDER — ENOXAPARIN SODIUM 80 MG/0.8ML ~~LOC~~ SOLN
1.0000 mg/kg | Freq: Once | SUBCUTANEOUS | Status: AC
  Administered 2017-06-16: 02:00:00 75 mg via SUBCUTANEOUS
  Filled 2017-06-16: qty 0.8

## 2017-06-16 MED ORDER — HYDROXYZINE HCL 50 MG/ML IM SOLN: 50.0000 mg | Freq: Four times a day (QID) | INTRAMUSCULAR | Status: DC | PRN

## 2017-06-16 NOTE — ED Notes (Signed)
Wait explained to pt.

## 2017-06-16 NOTE — ED Provider Notes (Signed)
[facility] EMERGENCY DEPARTMENT Provider Note   CSN: [id number] Arrival date & time: 06/15/17  2003     History   Chief Complaint Chief Complaint  Patient presents with  . Abdominal Pain  . Arm Pain    HPI Frank [name] is a 51 y.o. male.  Patient presents with complaints of nausea, vomiting and abdominal pain.  Patient well-known to this ER for countless visits with similar complaints.  Reviewing care everywhere reveals that he has been visiting [facility] with the same complaints recently as well.  Patient complaining of diffuse abdominal pain and inability to hold down his medications.  He is on Coumadin for recently diagnosed DVT, is concerned that he has not been able to hold down the Coumadin.  Right arm is swollen.  He has not had any fever.  There is no chest pain or shortness of breath.      Past Medical History:  Diagnosis Date  . Anemia   . Anxiety   . Chronic combined systolic and diastolic CHF (congestive heart failure) (HCC)    a. EF 20-25% by echo in 08/2015 b. echo 10/2015: EF 35-40%, diffuse HK, severe LAE, moderate RAE, small pericardial effusion  . Complication of anesthesia    itching, sore throat  . Depression   . Dialysis patient ([place])   . DVT (deep venous thrombosis) ([place]) 02/2017  . ESRD (end stage renal disease) ([place])    due to HTN per patient, followed at [facility], s/p failed kidney transplant - dialysis Tue, Th, Sat  . Hyperkalemia 12/2015  . Hypertension   . Junctional rhythm    a. noted in 08/2015: hyperkalemic at that time  b. 12/2015: presented in junctional rhythm w/ K+ of 6.6. Resolved with improvement of K+ levels.  . Nonischemic cardiomyopathy ([place])    a. 08/2014: cath showing minimal CAD, but tortuous arteries noted.   . Personal history of DVT (deep vein thrombosis)/ PE 05/26/2016   In Oct 2015 had small subsemental LUL PE w/o DVT (LE dopplers neg) and was felt to be HD cath related, treated w coumadin.  IN May  2016 had small vein DVT (acute/subacute) in the R basilic/ brachial veins of the RUE, resumed on coumadin.  Had R sided HD cath at that time.    . Renal insufficiency   . Shortness of breath   . Type II diabetes mellitus (HCC)    No history per patient, but remains under history as A1c would not be accurate given on dialysis    Patient Active Problem List   Diagnosis Date Noted  . ESRD (end stage renal disease) ([place]) 05/26/2017  . Recurrent deep venous thrombosis ([place]) 04/27/2017  . Marijuana abuse 04/21/2017  . Acute DVT (deep venous thrombosis) ([place]) 03/13/2017  . DVT (deep venous thrombosis) ([place]) 03/11/2017  . Anemia 02/24/2017  . Volume overload 01/13/2017  . Aortic atherosclerosis ([place]) 01/05/2017  . Abdominal pain 08/04/2016  . Uremia 06/07/2016  . Hyperkalemia 05/29/2016  . GERD (gastroesophageal reflux disease) 05/29/2016  . Personal history of DVT (deep vein thrombosis)/ PE 05/26/2016  . Nonischemic cardiomyopathy ([place]) 01/09/2016  . Bilateral low back pain without sciatica   . Renal cyst, left 10/30/2015  . Constipation by delayed colonic transit 10/30/2015  . Acute on chronic combined systolic and diastolic congestive heart failure ([place]) 09/23/2015  . Chest pain 09/08/2015  . Adjustment disorder with mixed anxiety and depressed mood 08/20/2015  . Essential hypertension 01/02/2015  . Dyslipidemia   .  Malignant hypertension 11/29/2014  . ESRD on hemodialysis ([place])   . Acute pulmonary edema (HCC)   . DM (diabetes mellitus), type 2, uncontrolled, with renal complications ([place])   . Complex sleep apnea syndrome 05/05/2014  . Anemia of chronic kidney failure 06/24/2013  . Nausea & vomiting 06/24/2013    Past Surgical History:  Procedure Laterality Date  . CAPD INSERTION    . CAPD REMOVAL    . INGUINAL HERNIA REPAIR Right 02/14/2015   Procedure: REPAIR INCARCERATED RIGHT INGUINAL HERNIA;  Surgeon: [name], MD;  Location: [facility];  Service: General;  Laterality:  Right;  . INSERTION OF DIALYSIS CATHETER Right 09/23/2015   Procedure: exchange of Right internal Dialysis Catheter.;  Surgeon: [name], MD;  Location: [facility];  Service: Vascular;  Laterality: Right;  . IR GENERIC HISTORICAL  07/16/2016   IR US GUIDE VASC ACCESS LEFT 07/16/2016 Corrie Mckusick, DO [facility]-INTERV RAD  . IR GENERIC HISTORICAL Left 07/16/2016   IR THROMBECTOMY AV FISTULA W/THROMBOLYSIS/PTA INC/SHUNT/IMG LEFT 07/16/2016 Corrie Mckusick, DO [facility]-INTERV RAD  . KIDNEY RECEIPIENT  2006   failed and started HD in March 2014  . LEFT HEART CATHETERIZATION WITH CORONARY ANGIOGRAM N/A 09/02/2014   Procedure: LEFT HEART CATHETERIZATION WITH CORONARY ANGIOGRAM;  Surgeon: [name], MD;  Location: [facility] CATH LAB;  Service: Cardiovascular;  Laterality: N/A;       Home Medications    Prior to Admission medications   Medication Sig Start Date End Date Taking? Authorizing Provider  amLODipine (NORVASC) 10 MG tablet Take 1 tablet (10 mg total) by mouth at bedtime. 12/01/16   Rice, Resa Miner, MD  carvedilol (COREG) 6.25 MG tablet Take 12.5 mg by mouth 2 (two) times daily. 04/03/17   [provider]  cloNIDine (CATAPRES - DOSED IN MG/24 HR) 0.3 mg/24hr patch Place 0.3 mg onto the skin once a week. On Wednesday  12/31/16   [provider]  dicyclomine (BENTYL) 20 MG tablet Take 20 mg by mouth every 6 (six) hours. 04/26/17   [provider]  gabapentin (NEURONTIN) 100 MG capsule Take 100 mg by mouth at bedtime. 03/20/17   [provider]  hydrALAZINE (APRESOLINE) 100 MG tablet Take 1 tablet (100 mg total) by mouth 2 (two) times daily. 01/07/17   Ledell Noss, MD  isosorbide mononitrate (IMDUR) 60 MG 24 hr tablet Take 1 tablet (60 mg total) by mouth daily. 01/08/17   Ledell Noss, MD  metoCLOPramide (REGLAN) 5 MG tablet Take 1 tablet (5 mg total) by mouth every 8 (eight) hours as needed for nausea or vomiting. 05/01/17   Duffy Bruce, MD  ondansetron (ZOFRAN-ODT) 4 MG  disintegrating tablet Take 1 tablet (4 mg total) by mouth every 8 (eight) hours as needed for nausea. 32m ODT q4 hours prn nausea 05/23/17   WRipley Fraise MD  oxyCODONE (ROXICODONE) 5 MG immediate release tablet Take 1 tablet (5 mg total) by mouth every 4 (four) hours as needed for severe pain. 05/01/17   IDuffy Bruce MD  pantoprazole (PROTONIX) 40 MG tablet Take 1 tablet (40 mg total) by mouth 2 (two) times daily before a meal. 12/01/16   Rice, CResa Miner MD  promethazine (PHENERGAN) 25 MG tablet Take 1 tablet (25 mg total) by mouth every 6 (six) hours as needed for nausea or vomiting. 06/13/17   REzequiel Essex MD  Respiratory Therapy Supplies MISC 1 each daily. CPAP MACHINE    [provider]  sevelamer carbonate (RENVELA) 800 MG tablet Take 3 tablets (2,400  mg total) by mouth 3 (three) times daily with meals. 04/22/17   [name], DO  traMADol (ULTRAM) 50 MG tablet Take 1 tablet (50 mg total) by mouth every 12 (twelve) hours as needed for moderate pain or severe pain. 12/01/16   Collier Salina, MD  warfarin (COUMADIN) 5 MG tablet Take 1.5 tablets (7.5 mg total) by mouth daily. 04/22/17   [name], DO    Family History Family History  Problem Relation Age of Onset  . Hypertension Other     Social History Social History   Tobacco Use  . Smoking status: Former Smoker    Packs/day: 0.00    Years: 1.00    Pack years: 0.00    Types: Cigarettes  . Smokeless tobacco: Never Used  . Tobacco comment: quit Jan 2014  Substance Use Topics  . Alcohol use: No  . Drug use: Yes    Types: Marijuana     Allergies   Butalbital-apap-caffeine; Ferrlecit [na ferric gluc cplx in sucrose]; Minoxidil; Tylenol [acetaminophen]; and Darvocet [propoxyphene n-acetaminophen]   Review of Systems Review of Systems  Gastrointestinal: Positive for abdominal pain, nausea and vomiting.  All other systems reviewed and are negative.    Physical Exam Updated Vital  Signs BP (!) 184/123 (BP Location: Right Arm)   Pulse 90   Temp 98.2 F (36.8 C) (Oral)   Resp 16   Ht _0  (1.88 m)   Wt 74.8 kg (165 lb)   SpO2 100%   BMI 21.18 kg/m   Physical Exam  Constitutional: He is oriented to person, place, and time. He appears well-developed and well-nourished. No distress.  HENT:  Head: Normocephalic and atraumatic.  Right Ear: Hearing normal.  Left Ear: Hearing normal.  Nose: Nose normal.  Mouth/Throat: Oropharynx is clear and moist and mucous membranes are normal.  Eyes: Conjunctivae and EOM are normal. Pupils are equal, round, and reactive to light.  Neck: Normal range of motion. Neck supple.  Cardiovascular: Regular rhythm, S1 normal and S2 normal. Exam reveals no gallop and no friction rub.  No murmur heard. Pulmonary/Chest: Effort normal and breath sounds normal. No respiratory distress. He exhibits no tenderness.  Abdominal: Soft. Normal appearance and bowel sounds are normal. There is no hepatosplenomegaly. There is generalized tenderness. There is no rebound, no guarding, no tenderness at McBurney's point and negative Murphy's sign. No hernia.  Musculoskeletal: Normal range of motion.  Diffuse edema right lower arm  Fistula left lower arm, positive thrill, no erythema or warmth  Neurological: He is alert and oriented to person, place, and time. He has normal strength. No cranial nerve deficit or sensory deficit. Coordination normal. GCS eye subscore is 4. GCS verbal subscore is 5. GCS motor subscore is 6.  Skin: Skin is warm, dry and intact. No rash noted. No cyanosis.  Psychiatric: He has a normal mood and affect. His speech is normal and behavior is normal. Thought content normal.  Nursing note and vitals reviewed.    ED Treatments / Results  Labs (all labs ordered are listed, but only abnormal results are displayed) Labs Reviewed  LIPASE, BLOOD - Abnormal; Notable for the following components:      Result Value   Lipase 90 (*)     All other components within normal limits  COMPREHENSIVE METABOLIC PANEL - Abnormal; Notable for the following components:   Sodium 134 (*)    Chloride 95 (*)    BUN 64 (*)    Creatinine, Ser 11.44 (*)  Calcium 8.6 (*)    Albumin 3.3 (*)    ALT 14 (*)    Alkaline Phosphatase 138 (*)    GFR calc non Af Amer 4 (*)    GFR calc Af Amer 5 (*)    All other components within normal limits  CBC - Abnormal; Notable for the following components:   RBC 3.92 (*)    Hemoglobin 11.4 (*)    HCT 35.0 (*)    RDW 18.1 (*)    All other components within normal limits  PROTIME-INR    EKG  EKG Interpretation None       Radiology No results found.  Procedures Procedures (including critical care time)  Medications Ordered in ED Medications  hydrOXYzine (VISTARIL) injection 50 mg (not administered)  metoCLOPramide (REGLAN) injection 10 mg (10 mg Intramuscular Given 06/16/17 0208)  oxyCODONE (Oxy IR/ROXICODONE) immediate release tablet 10 mg (10 mg Oral Given 06/16/17 0209)  enoxaparin (LOVENOX) injection 75 mg (75 mg Subcutaneous Given 06/16/17 0229)     Initial Impression / Assessment and Plan / ED Course  I have reviewed the triage vital signs and the nursing notes.  Pertinent labs & imaging results that were available during my care of the patient were reviewed by me and considered in my medical decision making (see chart for details).     Patient presents to the emergency department for evaluation of abdominal pain, nausea, vomiting.  Patient has a history of chronic pancreatitis and chronic recurrent nausea and vomiting.  His presentation is consistent with numerous previous presentations.  He does not have any evidence of an acute surgical process on examination.  Patient administered IM Reglan and Vistaril for his nausea and vomiting.  He was given an oxycodone tablet, we try not to give him recurrent doses of narcotics in the emergency department.  His workup is  reassuring.  Patient complaining of swelling of the right arm.  He reports a history of DVT.  He takes Coumadin for this, but his INR is 1.1 today.  He was administered Lovenox and is to follow-up with his doctor as an outpatient for continued bridge therapy.  Final Clinical Impressions(s) / ED Diagnoses   Final diagnoses:  Chronic abdominal pain  Deep vein thrombosis (DVT) of right upper extremity, unspecified chronicity, unspecified vein [facility])    ED Discharge Orders    None       Orpah Greek, MD 06/16/17 [phone]

## 2017-06-16 NOTE — ED Notes (Signed)
Pt remains in waiting room. Updated on wait for treatment room.

## 2017-06-16 NOTE — ED Notes (Signed)
Warm blanket given

## 2017-06-16 NOTE — Discharge Instructions (Signed)
Follow-up with your doctor today to have further treatment with anticoagulation therapy.

## 2017-06-17 DIAGNOSIS — E8770 Fluid overload, unspecified: Secondary | ICD-10-CM | POA: Diagnosis not present

## 2017-06-17 DIAGNOSIS — D509 Iron deficiency anemia, unspecified: Secondary | ICD-10-CM | POA: Diagnosis not present

## 2017-06-17 DIAGNOSIS — N2581 Secondary hyperparathyroidism of renal origin: Secondary | ICD-10-CM | POA: Diagnosis not present

## 2017-06-17 DIAGNOSIS — D631 Anemia in chronic kidney disease: Secondary | ICD-10-CM | POA: Diagnosis not present

## 2017-06-17 DIAGNOSIS — N186 End stage renal disease: Secondary | ICD-10-CM | POA: Diagnosis not present

## 2017-06-18 DIAGNOSIS — D509 Iron deficiency anemia, unspecified: Secondary | ICD-10-CM | POA: Diagnosis not present

## 2017-06-18 DIAGNOSIS — N186 End stage renal disease: Secondary | ICD-10-CM | POA: Diagnosis not present

## 2017-06-18 DIAGNOSIS — E8770 Fluid overload, unspecified: Secondary | ICD-10-CM | POA: Diagnosis not present

## 2017-06-18 DIAGNOSIS — N2581 Secondary hyperparathyroidism of renal origin: Secondary | ICD-10-CM | POA: Diagnosis not present

## 2017-06-18 DIAGNOSIS — D631 Anemia in chronic kidney disease: Secondary | ICD-10-CM | POA: Diagnosis not present

## 2017-06-20 ENCOUNTER — Encounter (HOSPITAL_COMMUNITY): Payer: Self-pay

## 2017-06-20 ENCOUNTER — Emergency Department (HOSPITAL_COMMUNITY)
Admission: EM | Admit: 2017-06-20 | Discharge: 2017-06-20 | Disposition: A | Discharge status: Home / Self Care | Payer: Medicare Other | Attending: Emergency Medicine | Admitting: Emergency Medicine

## 2017-06-20 ENCOUNTER — Other Ambulatory Visit: Payer: Self-pay

## 2017-06-20 ENCOUNTER — Emergency Department (HOSPITAL_COMMUNITY): Payer: Medicare Other

## 2017-06-20 DIAGNOSIS — Z992 Dependence on renal dialysis: Secondary | ICD-10-CM | POA: Diagnosis not present

## 2017-06-20 DIAGNOSIS — Z87891 Personal history of nicotine dependence: Secondary | ICD-10-CM | POA: Insufficient documentation

## 2017-06-20 DIAGNOSIS — I5042 Chronic combined systolic (congestive) and diastolic (congestive) heart failure: Secondary | ICD-10-CM | POA: Insufficient documentation

## 2017-06-20 DIAGNOSIS — M25532 Pain in left wrist: Secondary | ICD-10-CM | POA: Diagnosis not present

## 2017-06-20 DIAGNOSIS — N186 End stage renal disease: Secondary | ICD-10-CM | POA: Diagnosis not present

## 2017-06-20 DIAGNOSIS — E1122 Type 2 diabetes mellitus with diabetic chronic kidney disease: Secondary | ICD-10-CM | POA: Insufficient documentation

## 2017-06-20 DIAGNOSIS — Z79899 Other long term (current) drug therapy: Secondary | ICD-10-CM | POA: Insufficient documentation

## 2017-06-20 DIAGNOSIS — Z7901 Long term (current) use of anticoagulants: Secondary | ICD-10-CM | POA: Diagnosis not present

## 2017-06-20 DIAGNOSIS — I132 Hypertensive heart and chronic kidney disease with heart failure and with stage 5 chronic kidney disease, or end stage renal disease: Secondary | ICD-10-CM | POA: Insufficient documentation

## 2017-06-20 DIAGNOSIS — T8610 Unspecified complication of kidney transplant: Secondary | ICD-10-CM | POA: Diagnosis not present

## 2017-06-20 NOTE — ED Notes (Signed)
EDPA Provider at bedside.

## 2017-06-20 NOTE — ED Provider Notes (Signed)
[facility] DEPT Provider Note   CSN: [id number] Arrival date & time: 06/20/17  3419     History   Chief Complaint Chief Complaint  Patient presents with  . Wrist Pain    HPI PRENTIS LANGDON is a 51 y.o. male.  BERTON BUTRICK is a 51 y.o. Male who presents to the emergency department complaining of left wrist pain.  Patient reports he developed some left wrist pain after he slept on his wrist wrong. He reports his pain is worse with movement and is better when he self splints. He has taken nothing for treatment of his symptoms today.  He denies any numbness, tingling, weakness or other injury.   The history is provided by the patient and medical records. No language interpreter was used.  Wrist Pain     Past Medical History:  Diagnosis Date  . Anemia   . Anxiety   . Chronic combined systolic and diastolic CHF (congestive heart failure) (HCC)    a. EF 20-25% by echo in 08/2015 b. echo 10/2015: EF 35-40%, diffuse HK, severe LAE, moderate RAE, small pericardial effusion  . Complication of anesthesia    itching, sore throat  . Depression   . Dialysis patient ([place])   . DVT (deep venous thrombosis) ([place]) 02/2017  . ESRD (end stage renal disease) ([place])    due to HTN per patient, followed at [facility], s/p failed kidney transplant - dialysis Tue, Th, Sat  . Hyperkalemia 12/2015  . Hypertension   . Junctional rhythm    a. noted in 08/2015: hyperkalemic at that time  b. 12/2015: presented in junctional rhythm w/ K+ of 6.6. Resolved with improvement of K+ levels.  . Nonischemic cardiomyopathy ([place])    a. 08/2014: cath showing minimal CAD, but tortuous arteries noted.   . Personal history of DVT (deep vein thrombosis)/ PE 05/26/2016   In Oct 2015 had small subsemental LUL PE w/o DVT (LE dopplers neg) and was felt to be HD cath related, treated w coumadin.  IN May 2016 had small vein DVT (acute/subacute) in the R basilic/ brachial veins of the RUE, resumed on  coumadin.  Had R sided HD cath at that time.    . Renal insufficiency   . Shortness of breath   . Type II diabetes mellitus (HCC)    No history per patient, but remains under history as A1c would not be accurate given on dialysis    Patient Active Problem List   Diagnosis Date Noted  . ESRD (end stage renal disease) ([place]) 05/26/2017  . Recurrent deep venous thrombosis ([place]) 04/27/2017  . Marijuana abuse 04/21/2017  . Acute DVT (deep venous thrombosis) ([place]) 03/13/2017  . DVT (deep venous thrombosis) ([place]) 03/11/2017  . Anemia 02/24/2017  . Volume overload 01/13/2017  . Aortic atherosclerosis ([place]) 01/05/2017  . Abdominal pain 08/04/2016  . Uremia 06/07/2016  . Hyperkalemia 05/29/2016  . GERD (gastroesophageal reflux disease) 05/29/2016  . Personal history of DVT (deep vein thrombosis)/ PE 05/26/2016  . Nonischemic cardiomyopathy ([place]) 01/09/2016  . Bilateral low back pain without sciatica   . Renal cyst, left 10/30/2015  . Constipation by delayed colonic transit 10/30/2015  . Acute on chronic combined systolic and diastolic congestive heart failure ([place]) 09/23/2015  . Chest pain 09/08/2015  . Adjustment disorder with mixed anxiety and depressed mood 08/20/2015  . Essential hypertension 01/02/2015  . Dyslipidemia   . Malignant hypertension 11/29/2014  . ESRD on hemodialysis ([place])   . Acute pulmonary edema (  [place])   . DM (diabetes mellitus), type 2, uncontrolled, with renal complications ([place])   . Complex sleep apnea syndrome 05/05/2014  . Anemia of chronic kidney failure 06/24/2013  . Nausea & vomiting 06/24/2013    Past Surgical History:  Procedure Laterality Date  . CAPD INSERTION    . CAPD REMOVAL    . INGUINAL HERNIA REPAIR Right 02/14/2015   Procedure: REPAIR INCARCERATED RIGHT INGUINAL HERNIA;  Surgeon: [name], MD;  Location: [facility];  Service: General;  Laterality: Right;  . INSERTION OF DIALYSIS CATHETER Right 09/23/2015   Procedure: exchange of Right internal  Dialysis Catheter.;  Surgeon: [name], MD;  Location: [facility];  Service: Vascular;  Laterality: Right;  . IR GENERIC HISTORICAL  07/16/2016   IR US GUIDE VASC ACCESS LEFT 07/16/2016 Corrie Mckusick, DO [facility]-INTER[facility]  . IR GENERIC HISTORICAL Left 07/16/2016   IR THROMBECTOMY AV FISTULA W/THROMBOLYSIS/PTA INC/SHUNT/IMG LEFT 07/16/2016 Corrie Mckusick, DO [facility]INTER[facility]  . KIDNEY RECEIPIENT  2006   failed and started HD in March 2014  . LEFT HEART CATHETERIZATION WITH CORONARY ANGIOGRAM N/A 09/02/2014   Procedure: LEFT HEART CATHETERIZATION WITH CORONARY ANGIOGRAM;  Surgeon: [name], MD;  Location: [facility] CATH LAB;  Service: Cardiovascular;  Laterality: N/A;       Home Medications    Prior to Admission medications   Medication Sig Start Date End Date Taking? Authorizing Provider  amLODipine (NORVASC) 10 MG tablet Take 1 tablet (10 mg total) by mouth at bedtime. 12/01/16   Rice, Resa Miner, MD  carvedilol (COREG) 6.25 MG tablet Take 12.5 mg by mouth 2 (two) times daily. 04/03/17   [provider]  cloNIDine (CATAPRES - DOSED IN MG/24 HR) 0.3 mg/24hr patch Place 0.3 mg onto the skin once a week. On Wednesday  12/31/16   [provider]  dicyclomine (BENTYL) 20 MG tablet Take 20 mg by mouth every 6 (six) hours. 04/26/17   [provider]  gabapentin (NEURONTIN) 100 MG capsule Take 100 mg by mouth at bedtime. 03/20/17   [provider]  hydrALAZINE (APRESOLINE) 100 MG tablet Take 1 tablet (100 mg total) by mouth 2 (two) times daily. 01/07/17   Ledell Noss, MD  isosorbide mononitrate (IMDUR) 60 MG 24 hr tablet Take 1 tablet (60 mg total) by mouth daily. 01/08/17   Ledell Noss, MD  metoCLOPramide (REGLAN) 5 MG tablet Take 1 tablet (5 mg total) by mouth every 8 (eight) hours as needed for nausea or vomiting. 05/01/17   Duffy Bruce, MD  ondansetron (ZOFRAN-ODT) 4 MG disintegrating tablet Take 1 tablet (4 mg total) by mouth every 8 (eight) hours as needed for nausea.  65m ODT q4 hours prn nausea 05/23/17   WRipley Fraise MD  oxyCODONE (ROXICODONE) 5 MG immediate release tablet Take 1 tablet (5 mg total) by mouth every 4 (four) hours as needed for severe pain. 05/01/17   IDuffy Bruce MD  pantoprazole (PROTONIX) 40 MG tablet Take 1 tablet (40 mg total) by mouth 2 (two) times daily before a meal. 12/01/16   Rice, CResa Miner MD  promethazine (PHENERGAN) 25 MG tablet Take 1 tablet (25 mg total) by mouth every 6 (six) hours as needed for nausea or vomiting. 06/13/17   REzequiel Essex MD  Respiratory Therapy Supplies MISC 1 each daily. CPAP MACHINE    [provider]  sevelamer carbonate (RENVELA) 800 MG tablet Take 3 tablets (2,400 mg total) by mouth 3 (three) times daily with meals. 04/22/17   VEulogio Bear  U, DO  traMADol (ULTRAM) 50 MG tablet Take 1 tablet (50 mg total) by mouth every 12 (twelve) hours as needed for moderate pain or severe pain. 12/01/16   Collier Salina, MD  warfarin (COUMADIN) 5 MG tablet Take 1.5 tablets (7.5 mg total) by mouth daily. 04/22/17   [name], DO    Family History Family History  Problem Relation Age of Onset  . Hypertension Other     Social History Social History   Tobacco Use  . Smoking status: Former Smoker    Packs/day: 0.00    Years: 1.00    Pack years: 0.00    Types: Cigarettes  . Smokeless tobacco: Never Used  . Tobacco comment: quit Jan 2014  Substance Use Topics  . Alcohol use: No  . Drug use: No     Allergies   Butalbital-apap-caffeine; Ferrlecit [na ferric gluc cplx in sucrose]; Minoxidil; Tylenol [acetaminophen]; and Darvocet [propoxyphene n-acetaminophen]   Review of Systems Review of Systems  Constitutional: Negative for fever.  Musculoskeletal: Positive for arthralgias. Negative for joint swelling.  Skin: Negative for color change and wound.  Neurological: Negative for weakness and numbness.     Physical Exam Updated Vital Signs BP (!) 184/114 (BP Location:  Right Arm)   Pulse 84   Temp (!) 97.5 F (36.4 C) (Oral)   Resp 18   Ht _0  (1.88 m)   Wt 74.8 kg (165 lb)   SpO2 97%   BMI 21.18 kg/m   Physical Exam  Constitutional: He appears well-developed and well-nourished. No distress.  HENT:  Head: Normocephalic and atraumatic.  Eyes: Right eye exhibits no discharge. Left eye exhibits no discharge.  Cardiovascular: Normal rate, regular rhythm and intact distal pulses.  Bilateral radial pulses are intact.  Pulmonary/Chest: Effort normal. No respiratory distress.  Musculoskeletal: Normal range of motion. He exhibits tenderness. He exhibits no edema or deformity.  Pain with ROM of left wrist. No wrist edema or deformity noted.  Dialysis shunt noted to his left forearm.  Sensation is intact his bilateral fingertips.  Neurological: He is alert. No sensory deficit. Coordination normal.  Skin: Skin is warm and dry. Capillary refill takes less than 2 seconds. No rash noted. He is not diaphoretic. No erythema. No pallor.  Psychiatric: He has a normal mood and affect. His behavior is normal.  Nursing note and vitals reviewed.    ED Treatments / Results  Labs (all labs ordered are listed, but only abnormal results are displayed) Labs Reviewed - No data to display  EKG  EKG Interpretation None       Radiology No results found.  Procedures Procedures (including critical care time)  Medications Ordered in ED Medications - No data to display   Initial Impression / Assessment and Plan / ED Course  I have reviewed the triage vital signs and the nursing notes.  Pertinent labs & imaging results that were available during my care of the patient were reviewed by me and considered in my medical decision making (see chart for details).    This is a 51 y.o. Male who presents to the emergency department complaining of left wrist pain.  Patient reports he developed some left wrist pain after he slept on his wrist wrong. He reports his pain is  worse with movement and is better when he self splints.  On exam the patient is afebrile nontoxic-appearing.  He has some pain with range of motion at his left wrist.  No deformity or edema  or ecchymosis noted.  He is neurovascularly intact.  Good radial pulse and capillary refill.  I offered the patient an x-ray of his left wrist and patient declines.  He tells me he would just like something to help splint this.  He does have a dialysis catheter noted to his left forearm.  This is somewhat low and may interfere with Velcro wrist splint.  Will provide with an Ace bandage and encouraged to use ice for pain control.  I also advised he could use some Tylenol as needed for pain control.  Return precautions discussed. I advised the patient to follow-up with their primary care provider this week. I advised the patient to return to the emergency department with new or worsening symptoms or new concerns. The patient verbalized understanding and agreement with plan.     Final Clinical Impressions(s) / ED Diagnoses   Final diagnoses:  Left wrist pain    ED Discharge Orders    None       Waynetta Pean, PA-C 06/20/17 [place], Wenda Overland, MD 06/22/17 2133

## 2017-06-20 NOTE — ED Notes (Signed)
Bed: WTR5 Expected date:  Expected time:  Means of arrival:  Comments: 

## 2017-06-20 NOTE — ED Triage Notes (Signed)
Patient reports that he slept with his left wrist bent backwards.(patient has shunt in the left arm for dialysis). Patient states he is unable to move the left wrist.

## 2017-06-20 NOTE — ED Notes (Signed)
PT AWARE HE WAS BEING DISCHARGED. VERBALIZED I WAS OBTAINING DISCHARGE PAPERS. WILLIAM C STATED PT WAS WALKING OUT. SECURITY FOLLOWED PT. PT LEFT WITHOUT EVENT. PT LEFT WITHOUT SIGNING AND WITHOUT PAPERS.

## 2017-06-20 NOTE — ED Notes (Signed)
PT CONTINUES TO BE AGGRESSIVE TO THIS WRITER. PT WALKING INTO WRITER'S PERSONAL SPACE, ATTEMPTING TO WALK WITH WRITER INTO OTHER PT'S ROOMS. USING INTIMIDATION.  WITNESSED BY Blima Singer RN AND BECCA RN. WILL C NT AND SECURITY REQUESTED TO STAND BY FOR ASSISTANCE.

## 2017-06-20 NOTE — ED Notes (Signed)
Frank [name] APPLIED ACE WRAP TO LEFT WRIST. PT TOLERATED. EDP LITTLE MADE AWARE OF PT CURRENT STATUS. EXPRESSING PT'S DESIRE TO BE DISCHARGED.

## 2017-06-21 DIAGNOSIS — N186 End stage renal disease: Secondary | ICD-10-CM | POA: Diagnosis not present

## 2017-06-21 DIAGNOSIS — D631 Anemia in chronic kidney disease: Secondary | ICD-10-CM | POA: Diagnosis not present

## 2017-06-21 DIAGNOSIS — N2581 Secondary hyperparathyroidism of renal origin: Secondary | ICD-10-CM | POA: Diagnosis not present

## 2017-06-21 IMAGING — CT CT ANGIO CHEST
2 of 9 series · 19 of 46 positions shown · IV contrast (OMNI)
Comparison: 10/07/2014

CLINICAL DATA: Patient woke up this evening with central chest pain
and shortness of breath.

EXAM:
CT ANGIOGRAPHY CHEST WITH CONTRAST
TECHNIQUE: Multidetector CT imaging of the chest was performed using the
standard protocol during bolus administration of intravenous
contrast. Multiplanar CT image reconstructions and MIPs were
obtained to evaluate the vascular anatomy.
CONTRAST:  100mL OMNIPAQUE IOHEXOL 350 MG/ML SOLN

[Series 5: thins · axial · 0.66mm/px · z∈[-301,-36]mm · 16 of 299 slices shown]
[im 17/299  lung]
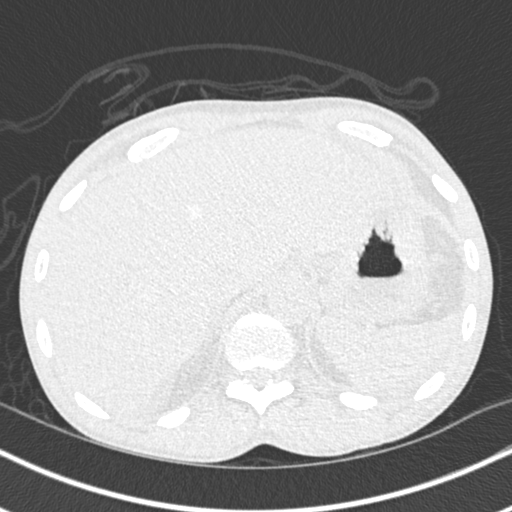
[im 34/299  soft-tissue]
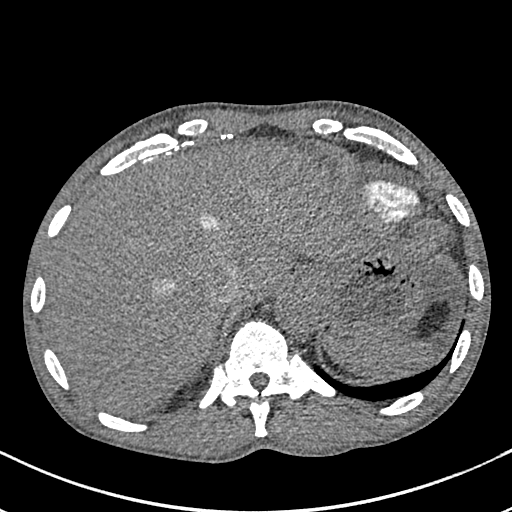
[im 50/299  lung]
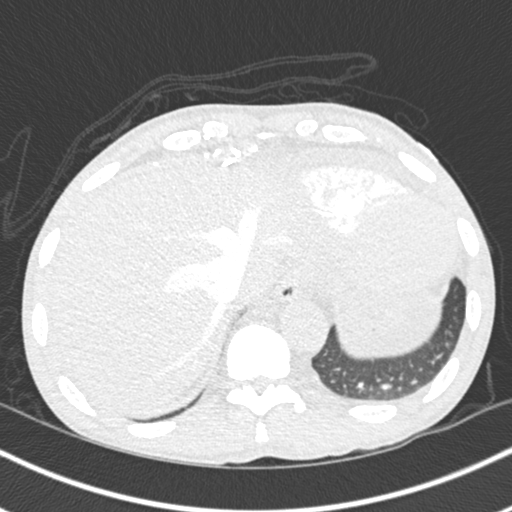
[im 67/299  soft-tissue]
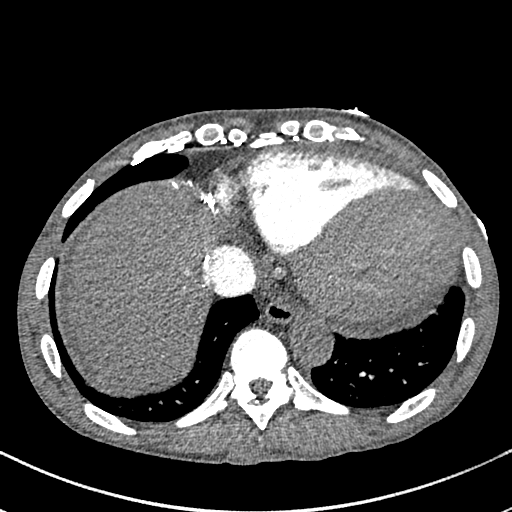
[im 83/299  lung]
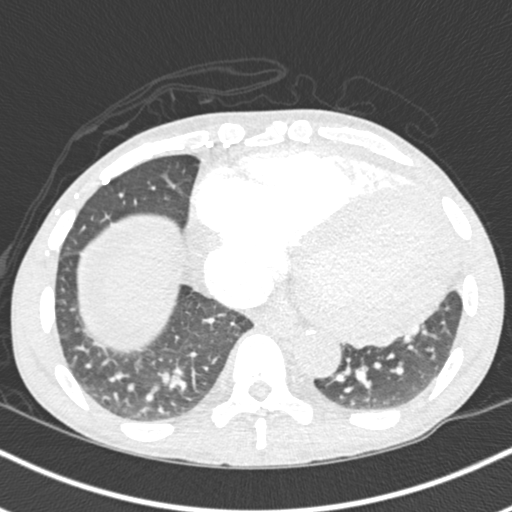
[im 100/299  soft-tissue]
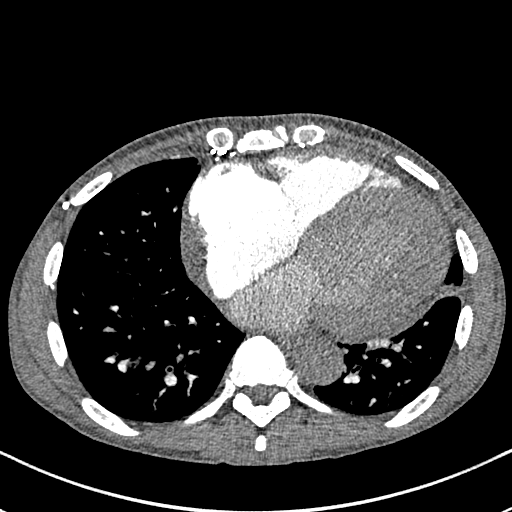
[im 116/299  lung]
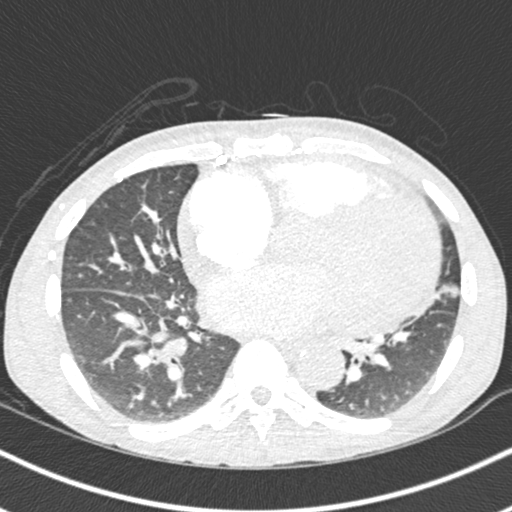
[im 133/299  soft-tissue]
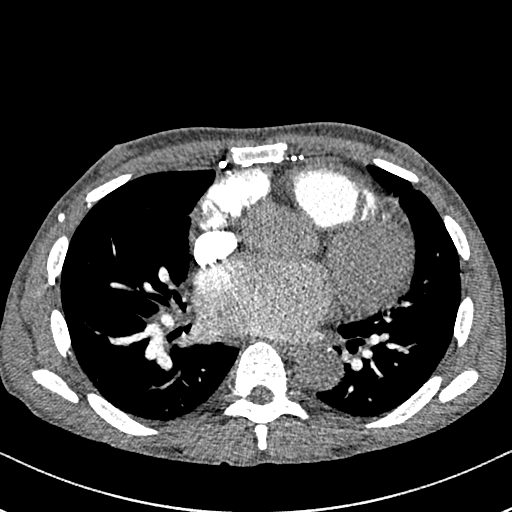
[im 166/299  lung]
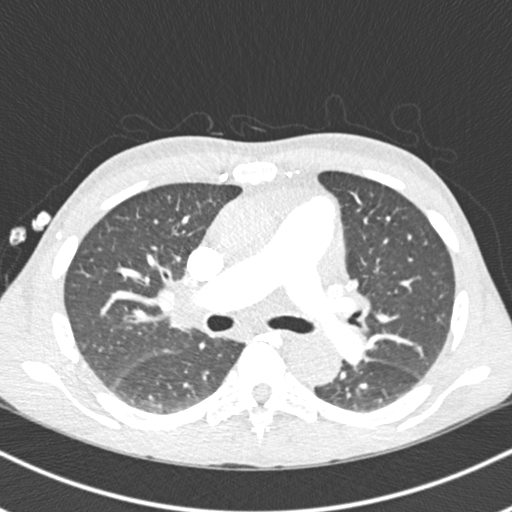
[im 183/299  soft-tissue]
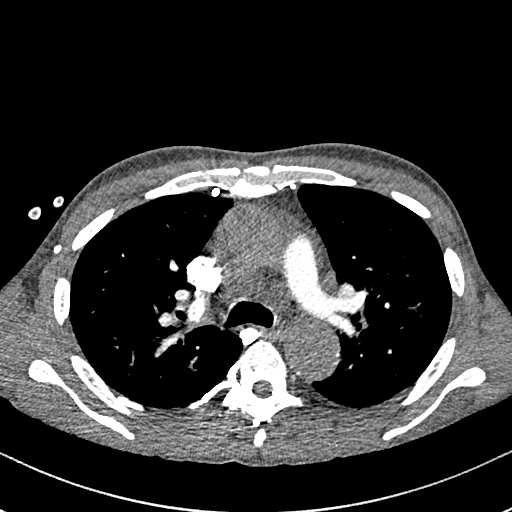
[im 199/299  lung]
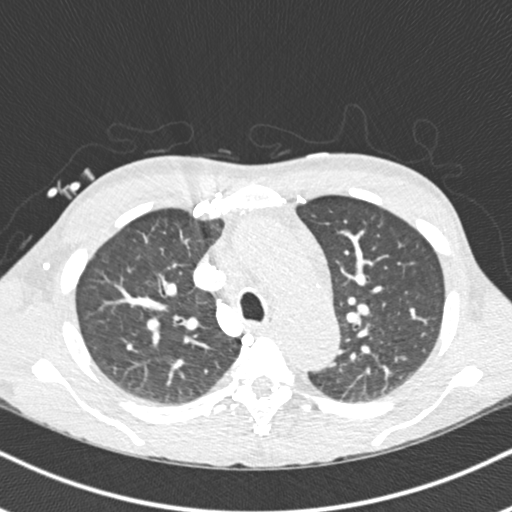
[im 216/299  soft-tissue]
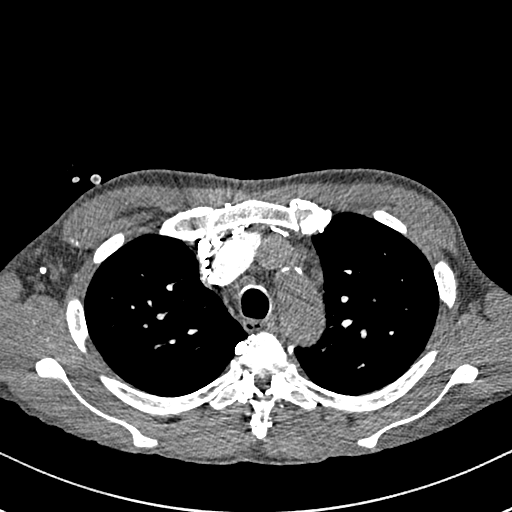
[im 232/299  lung]
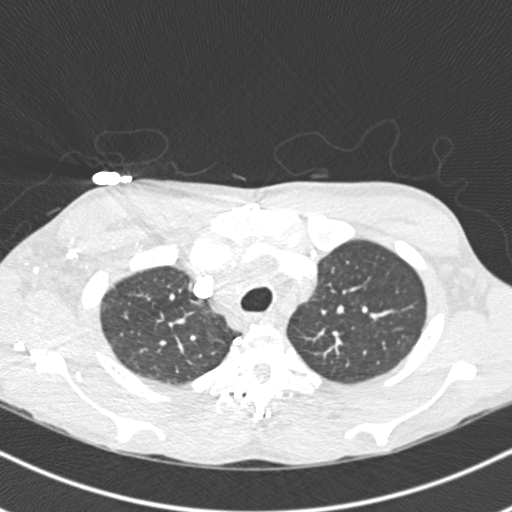
[im 249/299  soft-tissue]
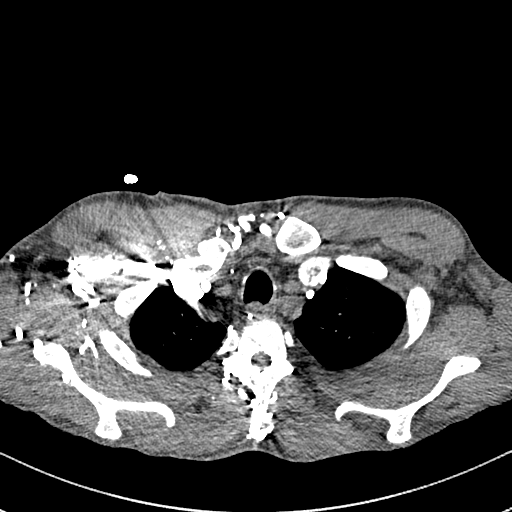
[im 265/299  lung]
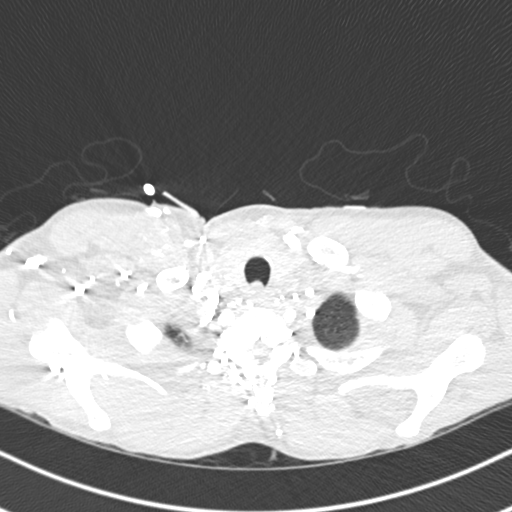
[im 282/299  soft-tissue]
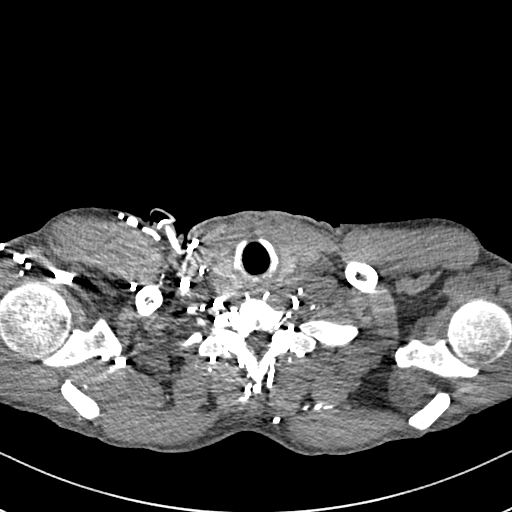

[Series 7: coronal mpr · coronal · 0.64mm/px · 3 of 106 slices shown]
[im 27/106  soft-tissue]
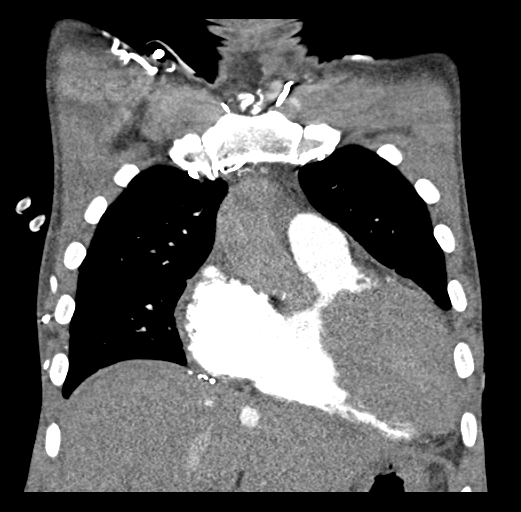
[im 53/106  soft-tissue]
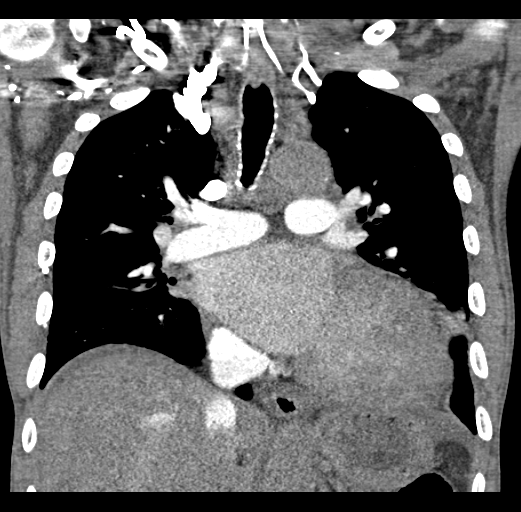
[im 79/106  soft-tissue]
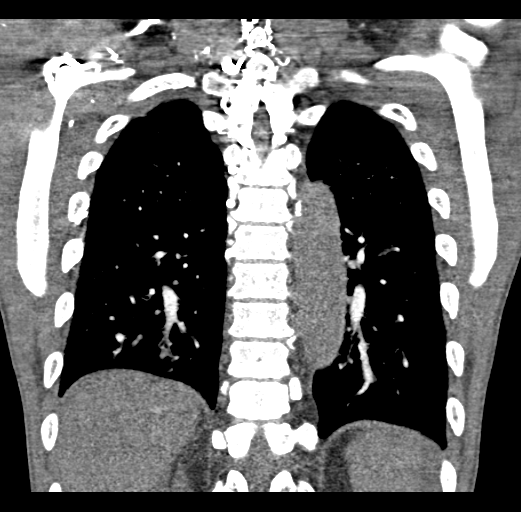

[19 of 46 positions shown; findings below may reference images not displayed]

FINDINGS: Technically adequate study with good opacification of the central
and segmental pulmonary arteries. No focal filling defects are
demonstrated. No evidence of significant pulmonary embolus.

Cardiac enlargement. Mild dilatation of the proximal descending
thoracic aorta at 3.8 cm diameter. No change since prior study.
Enlarged lymph nodes demonstrated throughout the mediastinum.
Largest lymph node is in the pretracheal region and measures about
16 mm short axis dimension. Lymph nodes appear similar to prior
study and may represent reactive change. Esophagus is decompressed.

Evaluation of lungs is limited due to respiratory motion artifact.
No evidence of focal consolidation or airspace disease. Airways
appear patent. No pleural effusions. No pneumothorax.

Included portions of the upper abdominal organs demonstrate
retrograde opacification of the hepatic veins suggesting right heart
failure. No destructive bone lesions.

Review of the MIP images confirms the above findings.
IMPRESSION: Cardiac enlargement and contrast reflux into the hepatic veins
suggesting right heart failure. No evidence of significant pulmonary
embolus.

## 2017-06-21 IMAGING — DX DG CHEST 2V
2 series · 2 of 2 positions shown · non-contrast
Comparison: 01/26/2015

CLINICAL DATA: Shortness of breath. Left-sided chest pain. Dialysis
patient. History of blood clots.

EXAM:
CHEST  2 VIEW

[chest pa]
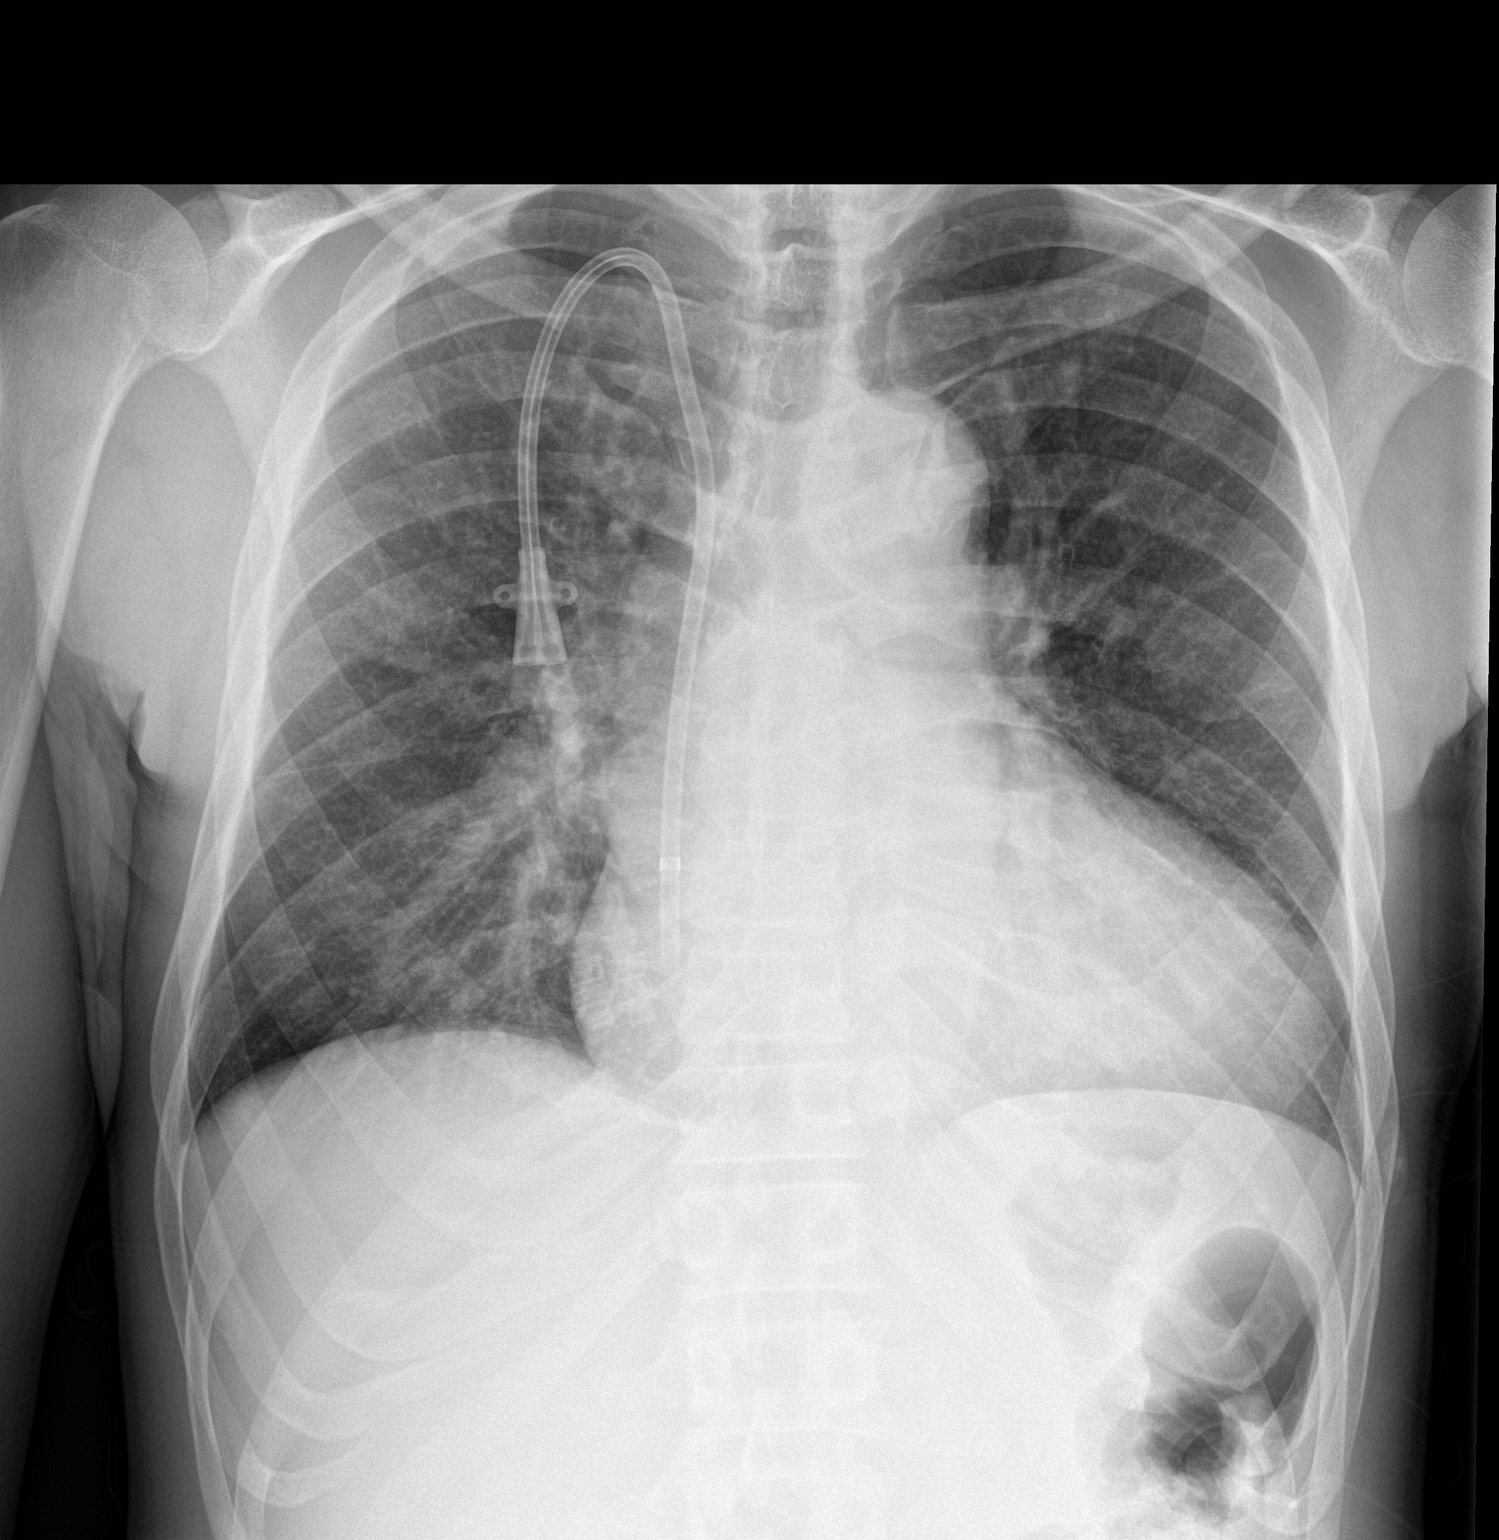

[chest lat]
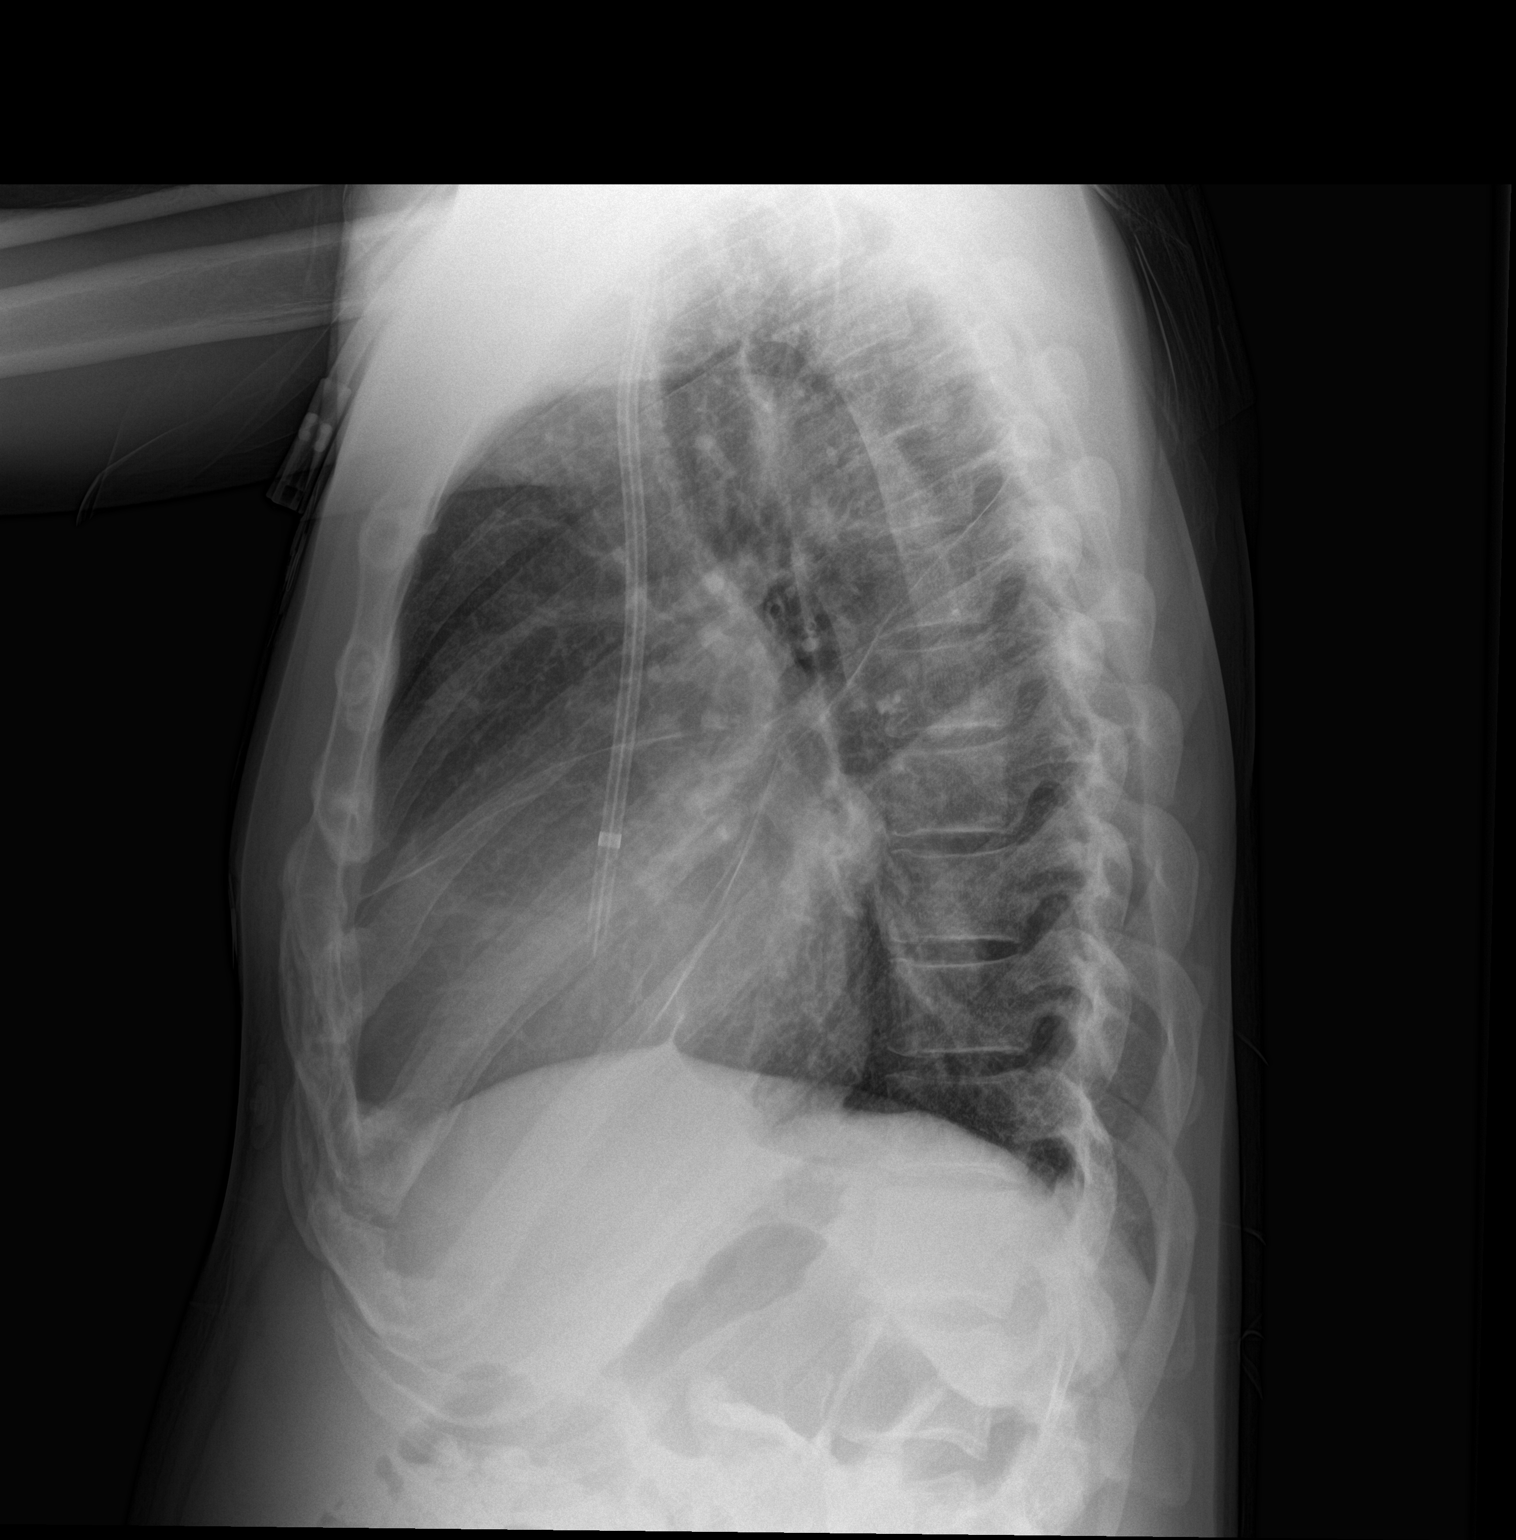

[2 of 2 positions shown; findings below may reference images not displayed]

FINDINGS: Mild cardiac enlargement without significant vascular congestion.
Mediastinal contours appear intact. No focal airspace disease or
consolidation in the lungs. No blunting of costophrenic angles. No
pneumothorax. Unchanged appearance of right central venous catheter.
IMPRESSION: Mild cardiac enlargement.  No evidence of active pulmonary disease.

## 2017-06-22 DIAGNOSIS — Z9115 Patient's noncompliance with renal dialysis: Secondary | ICD-10-CM | POA: Diagnosis not present

## 2017-06-22 DIAGNOSIS — R1011 Right upper quadrant pain: Secondary | ICD-10-CM | POA: Diagnosis not present

## 2017-06-22 DIAGNOSIS — R531 Weakness: Secondary | ICD-10-CM | POA: Diagnosis not present

## 2017-06-22 DIAGNOSIS — E1122 Type 2 diabetes mellitus with diabetic chronic kidney disease: Secondary | ICD-10-CM | POA: Diagnosis not present

## 2017-06-22 DIAGNOSIS — R112 Nausea with vomiting, unspecified: Secondary | ICD-10-CM | POA: Diagnosis not present

## 2017-06-22 DIAGNOSIS — N186 End stage renal disease: Secondary | ICD-10-CM | POA: Diagnosis not present

## 2017-06-22 DIAGNOSIS — J810 Acute pulmonary edema: Secondary | ICD-10-CM | POA: Diagnosis not present

## 2017-06-22 DIAGNOSIS — R0989 Other specified symptoms and signs involving the circulatory and respiratory systems: Secondary | ICD-10-CM | POA: Diagnosis not present

## 2017-06-22 DIAGNOSIS — Z992 Dependence on renal dialysis: Secondary | ICD-10-CM | POA: Diagnosis not present

## 2017-06-22 DIAGNOSIS — R918 Other nonspecific abnormal finding of lung field: Secondary | ICD-10-CM | POA: Diagnosis not present

## 2017-06-22 DIAGNOSIS — R079 Chest pain, unspecified: Secondary | ICD-10-CM | POA: Diagnosis not present

## 2017-06-22 DIAGNOSIS — Z888 Allergy status to other drugs, medicaments and biological substances status: Secondary | ICD-10-CM | POA: Diagnosis not present

## 2017-06-22 DIAGNOSIS — I10 Essential (primary) hypertension: Secondary | ICD-10-CM | POA: Diagnosis not present

## 2017-06-22 DIAGNOSIS — I517 Cardiomegaly: Secondary | ICD-10-CM | POA: Diagnosis not present

## 2017-06-22 DIAGNOSIS — Z7901 Long term (current) use of anticoagulants: Secondary | ICD-10-CM | POA: Diagnosis not present

## 2017-06-22 DIAGNOSIS — Z79899 Other long term (current) drug therapy: Secondary | ICD-10-CM | POA: Diagnosis not present

## 2017-06-22 DIAGNOSIS — I120 Hypertensive chronic kidney disease with stage 5 chronic kidney disease or end stage renal disease: Secondary | ICD-10-CM | POA: Diagnosis not present

## 2017-06-22 DIAGNOSIS — E875 Hyperkalemia: Secondary | ICD-10-CM | POA: Diagnosis not present

## 2017-06-24 ENCOUNTER — Emergency Department (HOSPITAL_COMMUNITY): Payer: Medicare Other

## 2017-06-24 ENCOUNTER — Emergency Department (HOSPITAL_COMMUNITY)
Admission: EM | Admit: 2017-06-24 | Discharge: 2017-06-24 | Discharge status: Against Medical Advice | Payer: Medicare Other | Attending: Emergency Medicine | Admitting: Emergency Medicine

## 2017-06-24 ENCOUNTER — Inpatient Hospital Stay (HOSPITAL_COMMUNITY)
Admission: EM | Admit: 2017-06-24 | Discharge: 2017-06-27 | DRG: 391 | Disposition: A | Discharge status: Home / Self Care | Payer: Medicare Other | Attending: Family Medicine | Admitting: Family Medicine

## 2017-06-24 ENCOUNTER — Encounter (HOSPITAL_COMMUNITY): Payer: Self-pay | Admitting: Emergency Medicine

## 2017-06-24 ENCOUNTER — Other Ambulatory Visit: Payer: Self-pay

## 2017-06-24 DIAGNOSIS — Z9119 Patient's noncompliance with other medical treatment and regimen: Secondary | ICD-10-CM

## 2017-06-24 DIAGNOSIS — S3991XA Unspecified injury of abdomen, initial encounter: Secondary | ICD-10-CM | POA: Diagnosis not present

## 2017-06-24 DIAGNOSIS — Z79899 Other long term (current) drug therapy: Secondary | ICD-10-CM

## 2017-06-24 DIAGNOSIS — Z992 Dependence on renal dialysis: Secondary | ICD-10-CM

## 2017-06-24 DIAGNOSIS — N186 End stage renal disease: Secondary | ICD-10-CM | POA: Diagnosis not present

## 2017-06-24 DIAGNOSIS — N2581 Secondary hyperparathyroidism of renal origin: Secondary | ICD-10-CM | POA: Diagnosis present

## 2017-06-24 DIAGNOSIS — M25512 Pain in left shoulder: Secondary | ICD-10-CM | POA: Diagnosis present

## 2017-06-24 DIAGNOSIS — Z86718 Personal history of other venous thrombosis and embolism: Secondary | ICD-10-CM

## 2017-06-24 DIAGNOSIS — F329 Major depressive disorder, single episode, unspecified: Secondary | ICD-10-CM | POA: Diagnosis present

## 2017-06-24 DIAGNOSIS — Z5321 Procedure and treatment not carried out due to patient leaving prior to being seen by health care provider: Secondary | ICD-10-CM | POA: Insufficient documentation

## 2017-06-24 DIAGNOSIS — Y92410 Unspecified street and highway as the place of occurrence of the external cause: Secondary | ICD-10-CM

## 2017-06-24 DIAGNOSIS — R1084 Generalized abdominal pain: Secondary | ICD-10-CM

## 2017-06-24 DIAGNOSIS — R1013 Epigastric pain: Secondary | ICD-10-CM | POA: Diagnosis present

## 2017-06-24 DIAGNOSIS — Z888 Allergy status to other drugs, medicaments and biological substances status: Secondary | ICD-10-CM

## 2017-06-24 DIAGNOSIS — E1122 Type 2 diabetes mellitus with diabetic chronic kidney disease: Secondary | ICD-10-CM | POA: Diagnosis not present

## 2017-06-24 DIAGNOSIS — M545 Low back pain: Secondary | ICD-10-CM | POA: Diagnosis present

## 2017-06-24 DIAGNOSIS — E8889 Other specified metabolic disorders: Secondary | ICD-10-CM | POA: Diagnosis present

## 2017-06-24 DIAGNOSIS — S279XXA Injury of unspecified intrathoracic organ, initial encounter: Secondary | ICD-10-CM | POA: Diagnosis not present

## 2017-06-24 DIAGNOSIS — K219 Gastro-esophageal reflux disease without esophagitis: Secondary | ICD-10-CM | POA: Diagnosis present

## 2017-06-24 DIAGNOSIS — Z7901 Long term (current) use of anticoagulants: Secondary | ICD-10-CM

## 2017-06-24 DIAGNOSIS — R109 Unspecified abdominal pain: Principal | ICD-10-CM | POA: Diagnosis present

## 2017-06-24 DIAGNOSIS — S299XXA Unspecified injury of thorax, initial encounter: Secondary | ICD-10-CM | POA: Diagnosis not present

## 2017-06-24 DIAGNOSIS — D631 Anemia in chronic kidney disease: Secondary | ICD-10-CM | POA: Diagnosis not present

## 2017-06-24 DIAGNOSIS — Z9115 Patient's noncompliance with renal dialysis: Secondary | ICD-10-CM

## 2017-06-24 DIAGNOSIS — M542 Cervicalgia: Secondary | ICD-10-CM | POA: Diagnosis present

## 2017-06-24 DIAGNOSIS — S0990XA Unspecified injury of head, initial encounter: Secondary | ICD-10-CM | POA: Diagnosis not present

## 2017-06-24 DIAGNOSIS — G8929 Other chronic pain: Secondary | ICD-10-CM | POA: Diagnosis present

## 2017-06-24 DIAGNOSIS — E785 Hyperlipidemia, unspecified: Secondary | ICD-10-CM | POA: Diagnosis present

## 2017-06-24 DIAGNOSIS — I132 Hypertensive heart and chronic kidney disease with heart failure and with stage 5 chronic kidney disease, or end stage renal disease: Secondary | ICD-10-CM | POA: Diagnosis present

## 2017-06-24 DIAGNOSIS — Z885 Allergy status to narcotic agent status: Secondary | ICD-10-CM

## 2017-06-24 DIAGNOSIS — Z8249 Family history of ischemic heart disease and other diseases of the circulatory system: Secondary | ICD-10-CM

## 2017-06-24 DIAGNOSIS — K8681 Exocrine pancreatic insufficiency: Secondary | ICD-10-CM | POA: Diagnosis present

## 2017-06-24 DIAGNOSIS — S199XXA Unspecified injury of neck, initial encounter: Secondary | ICD-10-CM | POA: Diagnosis not present

## 2017-06-24 DIAGNOSIS — K861 Other chronic pancreatitis: Secondary | ICD-10-CM | POA: Diagnosis not present

## 2017-06-24 DIAGNOSIS — I120 Hypertensive chronic kidney disease with stage 5 chronic kidney disease or end stage renal disease: Secondary | ICD-10-CM | POA: Diagnosis not present

## 2017-06-24 DIAGNOSIS — M25511 Pain in right shoulder: Secondary | ICD-10-CM | POA: Diagnosis present

## 2017-06-24 DIAGNOSIS — E875 Hyperkalemia: Secondary | ICD-10-CM | POA: Diagnosis not present

## 2017-06-24 DIAGNOSIS — F419 Anxiety disorder, unspecified: Secondary | ICD-10-CM | POA: Diagnosis present

## 2017-06-24 LAB — CBC WITH DIFFERENTIAL/PLATELET
Basophils Absolute: 0 10*3/uL (ref 0.0–0.1)
Basophils Relative: 1 %
Eosinophils Absolute: 0.2 10*3/uL (ref 0.0–0.7)
Eosinophils Relative: 4 %
HCT: 33.6 % — ABNORMAL LOW (ref 39.0–52.0)
Hemoglobin: 11.1 g/dL — ABNORMAL LOW (ref 13.0–17.0)
Lymphocytes Relative: 19 %
Lymphs Abs: 0.8 10*3/uL (ref 0.7–4.0)
MCH: 29.2 pg (ref 26.0–34.0)
MCHC: 33 g/dL (ref 30.0–36.0)
MCV: 88.4 fL (ref 78.0–100.0)
Monocytes Absolute: 0.5 10*3/uL (ref 0.1–1.0)
Monocytes Relative: 12 %
Neutro Abs: 2.9 10*3/uL (ref 1.7–7.7)
Neutrophils Relative %: 64 %
Platelets: 170 10*3/uL (ref 150–400)
RBC: 3.8 MIL/uL — ABNORMAL LOW (ref 4.22–5.81)
RDW: 17.6 % — ABNORMAL HIGH (ref 11.5–15.5)
WBC: 4.4 10*3/uL (ref 4.0–10.5)

## 2017-06-24 LAB — COMPREHENSIVE METABOLIC PANEL
ALBUMIN: 3.4 g/dL — AB (ref 3.5–5.0)
ALK PHOS: 102 U/L (ref 38–126)
ALT: 18 U/L (ref 17–63)
ANION GAP: 18 — AB (ref 5–15)
AST: 37 U/L (ref 15–41)
BUN: 92 mg/dL — ABNORMAL HIGH (ref 6–20)
CALCIUM: 8 mg/dL — AB (ref 8.9–10.3)
CHLORIDE: 96 mmol/L — AB (ref 101–111)
CO2: 22 mmol/L (ref 22–32)
Creatinine, Ser: 14.8 mg/dL — ABNORMAL HIGH (ref 0.61–1.24)
GFR calc Af Amer: 4 mL/min — ABNORMAL LOW (ref >60)
GFR calc non Af Amer: 3 mL/min — ABNORMAL LOW (ref >60)
GLUCOSE: 91 mg/dL (ref 65–99)
POTASSIUM: 7 mmol/L — AB (ref 3.5–5.1)
SODIUM: 136 mmol/L (ref 135–145)
Total Bilirubin: 0.8 mg/dL (ref 0.3–1.2)
Total Protein: 7.8 g/dL (ref 6.5–8.1)

## 2017-06-24 LAB — LIPASE, BLOOD: Lipase: 31 U/L (ref 11–51)

## 2017-06-24 MED ORDER — MORPHINE SULFATE (PF) 4 MG/ML IV SOLN
4.0000 mg | Freq: Once | INTRAVENOUS | Status: AC
  Administered 2017-06-24: 4 mg via INTRAMUSCULAR
  Filled 2017-06-24: qty 1

## 2017-06-24 MED ORDER — HEPARIN SODIUM (PORCINE) 1000 UNIT/ML DIALYSIS
1000.0000 [IU] | INTRAMUSCULAR | Status: DC | PRN
  Filled 2017-06-24: qty 1

## 2017-06-24 MED ORDER — SODIUM CHLORIDE 0.9 % IV SOLN: 100.0000 mL | INTRAVENOUS | Status: DC | PRN

## 2017-06-24 MED ORDER — SODIUM CHLORIDE 0.9 % IV SOLN
1.0000 g | Freq: Once | INTRAVENOUS | Status: DC
  Filled 2017-06-24: qty 10

## 2017-06-24 MED ORDER — MORPHINE SULFATE (PF) 4 MG/ML IV SOLN
4.0000 mg | Freq: Once | INTRAVENOUS | Status: AC
  Administered 2017-06-24: 4 mg via INTRAVENOUS

## 2017-06-24 MED ORDER — DEXTROSE 50 % IV SOLN
1.0000 | Freq: Once | INTRAVENOUS | Status: AC
  Administered 2017-06-24: 50 mL via INTRAVENOUS
  Filled 2017-06-24: qty 50

## 2017-06-24 MED ORDER — INSULIN ASPART 100 UNIT/ML ~~LOC~~ SOLN
5.0000 [IU] | Freq: Once | SUBCUTANEOUS | Status: AC
  Administered 2017-06-24: 5 [IU] via INTRAVENOUS
  Filled 2017-06-24: qty 1

## 2017-06-24 MED ORDER — LIDOCAINE-PRILOCAINE 2.5-2.5 % EX CREA
1.0000 "application " | TOPICAL_CREAM | CUTANEOUS | Status: DC | PRN
  Filled 2017-06-24: qty 5

## 2017-06-24 MED ORDER — PENTAFLUOROPROP-TETRAFLUOROETH EX AERO
1.0000 "application " | INHALATION_SPRAY | CUTANEOUS | Status: DC | PRN
  Filled 2017-06-24: qty 30

## 2017-06-24 MED ORDER — ONDANSETRON 4 MG PO TBDP
8.0000 mg | ORAL_TABLET | Freq: Once | ORAL | Status: AC
  Administered 2017-06-24: 8 mg via ORAL
  Filled 2017-06-24: qty 2

## 2017-06-24 MED ORDER — MORPHINE SULFATE (PF) 4 MG/ML IV SOLN
6.0000 mg | Freq: Once | INTRAVENOUS | Status: AC
  Administered 2017-06-24: 6 mg via INTRAMUSCULAR
  Filled 2017-06-24: qty 2

## 2017-06-24 MED ORDER — LIDOCAINE HCL (PF) 1 % IJ SOLN: 5.0000 mL | INTRAMUSCULAR | Status: DC | PRN

## 2017-06-24 MED ORDER — ALBUTEROL (5 MG/ML) CONTINUOUS INHALATION SOLN
10.0000 mg/h | INHALATION_SOLUTION | Freq: Once | RESPIRATORY_TRACT | Status: DC
  Filled 2017-06-24: qty 2

## 2017-06-24 MED ORDER — ALTEPLASE 2 MG IJ SOLR: 2.0000 mg | Freq: Once | INTRAMUSCULAR | Status: DC | PRN

## 2017-06-24 MED ORDER — SODIUM POLYSTYRENE SULFONATE 15 GM/60ML PO SUSP: 30.0000 g | Freq: Once | ORAL | Status: DC

## 2017-06-24 NOTE — ED Triage Notes (Signed)
Patient here with abdominal pain and chest pain.  He states that it is more abdominal pain than chest.  Patient states that it started at 0400, he has been having trouble sleeping in the last 4 days.  Patient states that it intensifies when he sits.  He is due for dialysis this morning.

## 2017-06-24 NOTE — ED Notes (Addendum)
IV team attempted x3 with ultrasound without success.  Pt states a doctor always has to start his IV and he prefers the "big ultrasound", [place], [place] notified

## 2017-06-24 NOTE — ED Notes (Signed)
Dr Ralene Bathe attempted ultrasound IV unsuccessful.

## 2017-06-24 NOTE — ED Notes (Signed)
Date and time results received: 06/24/17 2101   Test: Potassium Critical Value: 7.0  Name of Provider Notified: Dr Ralene Bathe Awaiting orders
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
## 2017-06-24 NOTE — ED Notes (Signed)
Patient transported to CT 
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
## 2017-06-24 NOTE — Consult Note (Signed)
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 ([phone]/10/2016, 10:32 PM

## 2017-06-24 NOTE — Procedures (Signed)
I was present at this dialysis session. I have reviewed the session itself and made appropriate changes.   There were no vitals filed for this visit.  Recent Labs  Lab 06/24/17 2003  NA 136  K 7.0*  CL 96*  CO2 22  GLUCOSE 91  BUN 92*  CREATININE 14.80*  CALCIUM 8.0*    Recent Labs  Lab 06/24/17 2003  WBC 4.4  NEUTROABS 2.9  HGB 11.1*  HCT 33.6*  MCV 88.4  PLT 170    Scheduled Meds: . albuterol  10 mg/hr Nebulization Once  . sodium polystyrene  30 g Oral Once   Continuous Infusions: . sodium chloride    . sodium chloride    . calcium gluconate     PRN Meds:.sodium chloride, sodium chloride, alteplase, heparin, lidocaine (PF), lidocaine-prilocaine, pentafluoroprop-tetrafluoroeth   Donetta Potts,  MD 06/24/2017, 11:01 PM

## 2017-06-24 NOTE — ED Notes (Signed)
IV team at bedside

## 2017-06-24 NOTE — ED Notes (Signed)
To Dialysis

## 2017-06-24 NOTE — ED Notes (Signed)
Tatyana advised of lack of labs and IV access, PA agreed to look with ultrasound
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
## 2017-06-24 NOTE — ED Notes (Signed)
Pt refusing to be on the monitor at this time, agreeable to dinamap checks

## 2017-06-24 NOTE — ED Notes (Signed)
Pt ambulated to stretcher. Nausea improved, requested c-collar be reapplied

## 2017-06-24 NOTE — ED Notes (Signed)
Dr Georgette Dover in with patient

## 2017-06-25 ENCOUNTER — Observation Stay (HOSPITAL_COMMUNITY): Payer: Medicare Other

## 2017-06-25 DIAGNOSIS — E785 Hyperlipidemia, unspecified: Secondary | ICD-10-CM | POA: Diagnosis present

## 2017-06-25 DIAGNOSIS — M25511 Pain in right shoulder: Secondary | ICD-10-CM | POA: Diagnosis not present

## 2017-06-25 DIAGNOSIS — F329 Major depressive disorder, single episode, unspecified: Secondary | ICD-10-CM | POA: Diagnosis present

## 2017-06-25 DIAGNOSIS — R1084 Generalized abdominal pain: Secondary | ICD-10-CM | POA: Diagnosis not present

## 2017-06-25 DIAGNOSIS — E1122 Type 2 diabetes mellitus with diabetic chronic kidney disease: Secondary | ICD-10-CM | POA: Diagnosis not present

## 2017-06-25 DIAGNOSIS — M545 Low back pain: Secondary | ICD-10-CM | POA: Diagnosis present

## 2017-06-25 DIAGNOSIS — Z885 Allergy status to narcotic agent status: Secondary | ICD-10-CM | POA: Diagnosis not present

## 2017-06-25 DIAGNOSIS — Z9115 Patient's noncompliance with renal dialysis: Secondary | ICD-10-CM | POA: Diagnosis not present

## 2017-06-25 DIAGNOSIS — Y92410 Unspecified street and highway as the place of occurrence of the external cause: Secondary | ICD-10-CM | POA: Diagnosis not present

## 2017-06-25 DIAGNOSIS — E8889 Other specified metabolic disorders: Secondary | ICD-10-CM | POA: Diagnosis present

## 2017-06-25 DIAGNOSIS — F419 Anxiety disorder, unspecified: Secondary | ICD-10-CM | POA: Diagnosis present

## 2017-06-25 DIAGNOSIS — I120 Hypertensive chronic kidney disease with stage 5 chronic kidney disease or end stage renal disease: Secondary | ICD-10-CM | POA: Diagnosis not present

## 2017-06-25 DIAGNOSIS — G8929 Other chronic pain: Secondary | ICD-10-CM | POA: Diagnosis present

## 2017-06-25 DIAGNOSIS — K219 Gastro-esophageal reflux disease without esophagitis: Secondary | ICD-10-CM | POA: Diagnosis present

## 2017-06-25 DIAGNOSIS — M25512 Pain in left shoulder: Secondary | ICD-10-CM | POA: Diagnosis present

## 2017-06-25 DIAGNOSIS — K8681 Exocrine pancreatic insufficiency: Secondary | ICD-10-CM | POA: Diagnosis present

## 2017-06-25 DIAGNOSIS — E875 Hyperkalemia: Secondary | ICD-10-CM | POA: Diagnosis not present

## 2017-06-25 DIAGNOSIS — Z888 Allergy status to other drugs, medicaments and biological substances status: Secondary | ICD-10-CM | POA: Diagnosis not present

## 2017-06-25 DIAGNOSIS — K861 Other chronic pancreatitis: Secondary | ICD-10-CM | POA: Diagnosis present

## 2017-06-25 DIAGNOSIS — N186 End stage renal disease: Secondary | ICD-10-CM | POA: Diagnosis not present

## 2017-06-25 DIAGNOSIS — S79911A Unspecified injury of right hip, initial encounter: Secondary | ICD-10-CM | POA: Diagnosis not present

## 2017-06-25 DIAGNOSIS — M542 Cervicalgia: Secondary | ICD-10-CM | POA: Diagnosis present

## 2017-06-25 DIAGNOSIS — Z86718 Personal history of other venous thrombosis and embolism: Secondary | ICD-10-CM | POA: Diagnosis not present

## 2017-06-25 DIAGNOSIS — N2581 Secondary hyperparathyroidism of renal origin: Secondary | ICD-10-CM | POA: Diagnosis not present

## 2017-06-25 DIAGNOSIS — Z992 Dependence on renal dialysis: Secondary | ICD-10-CM | POA: Diagnosis not present

## 2017-06-25 DIAGNOSIS — I132 Hypertensive heart and chronic kidney disease with heart failure and with stage 5 chronic kidney disease, or end stage renal disease: Secondary | ICD-10-CM | POA: Diagnosis present

## 2017-06-25 DIAGNOSIS — Z8249 Family history of ischemic heart disease and other diseases of the circulatory system: Secondary | ICD-10-CM | POA: Diagnosis not present

## 2017-06-25 DIAGNOSIS — R109 Unspecified abdominal pain: Secondary | ICD-10-CM | POA: Diagnosis not present

## 2017-06-25 DIAGNOSIS — D631 Anemia in chronic kidney disease: Secondary | ICD-10-CM | POA: Diagnosis not present

## 2017-06-25 LAB — RENAL FUNCTION PANEL
ALBUMIN: 2.9 g/dL — AB (ref 3.5–5.0)
Anion gap: 15 (ref 5–15)
BUN: 40 mg/dL — AB (ref 6–20)
CALCIUM: 8.1 mg/dL — AB (ref 8.9–10.3)
CO2: 24 mmol/L (ref 22–32)
CREATININE: 8.4 mg/dL — AB (ref 0.61–1.24)
Chloride: 96 mmol/L — ABNORMAL LOW (ref 101–111)
GFR calc Af Amer: 7 mL/min — ABNORMAL LOW (ref >60)
GFR, EST NON AFRICAN AMERICAN: 6 mL/min — AB (ref >60)
Glucose, Bld: 68 mg/dL (ref 65–99)
PHOSPHORUS: 6 mg/dL — AB (ref 2.5–4.6)
POTASSIUM: 4.8 mmol/L (ref 3.5–5.1)
Sodium: 135 mmol/L (ref 135–145)

## 2017-06-25 LAB — PROTIME-INR
INR: 1.36
Prothrombin Time: 16.7 seconds — ABNORMAL HIGH (ref 11.4–15.2)

## 2017-06-25 LAB — CBC
HCT: 31.4 % — ABNORMAL LOW (ref 39.0–52.0)
Hemoglobin: 10.1 g/dL — ABNORMAL LOW (ref 13.0–17.0)
MCH: 28.5 pg (ref 26.0–34.0)
MCHC: 32.2 g/dL (ref 30.0–36.0)
MCV: 88.7 fL (ref 78.0–100.0)
PLATELETS: 160 10*3/uL (ref 150–400)
RBC: 3.54 MIL/uL — ABNORMAL LOW (ref 4.22–5.81)
RDW: 17.6 % — AB (ref 11.5–15.5)
WBC: 3.7 10*3/uL — AB (ref 4.0–10.5)

## 2017-06-25 LAB — MRSA PCR SCREENING: MRSA BY PCR: NEGATIVE

## 2017-06-25 LAB — APTT: APTT: 40 s — AB (ref 24–36)

## 2017-06-25 MED ORDER — WARFARIN - PHARMACIST DOSING INPATIENT
Freq: Every day | Status: DC
Start: 2017-06-25 — End: 2017-06-27

## 2017-06-25 MED ORDER — SODIUM CHLORIDE 0.9 % IV SOLN: 100.0000 mL | INTRAVENOUS | Status: DC | PRN

## 2017-06-25 MED ORDER — WARFARIN SODIUM 7.5 MG PO TABS: 7.5000 mg | ORAL_TABLET | Freq: Every day | ORAL | Status: DC

## 2017-06-25 MED ORDER — WARFARIN SODIUM 7.5 MG PO TABS
7.5000 mg | ORAL_TABLET | Freq: Once | ORAL | Status: AC
  Administered 2017-06-25: 7.5 mg via ORAL
  Filled 2017-06-25: qty 1

## 2017-06-25 MED ORDER — AMLODIPINE BESYLATE 10 MG PO TABS
10.0000 mg | ORAL_TABLET | Freq: Every day | ORAL | Status: DC
  Administered 2017-06-25: 10 mg via ORAL
  Filled 2017-06-25 (×3): qty 1

## 2017-06-25 MED ORDER — METOCLOPRAMIDE HCL 5 MG PO TABS: 5.0000 mg | ORAL_TABLET | Freq: Four times a day (QID) | ORAL | Status: DC | PRN

## 2017-06-25 MED ORDER — CARVEDILOL 6.25 MG PO TABS
6.2500 mg | ORAL_TABLET | Freq: Two times a day (BID) | ORAL | Status: DC
  Administered 2017-06-25 – 2017-06-27 (×3): 6.25 mg via ORAL
  Filled 2017-06-25 (×3): qty 1

## 2017-06-25 MED ORDER — ALTEPLASE 2 MG IJ SOLR: 2.0000 mg | Freq: Once | INTRAMUSCULAR | Status: DC | PRN

## 2017-06-25 MED ORDER — LIDOCAINE-PRILOCAINE 2.5-2.5 % EX CREA: 1.0000 "application " | TOPICAL_CREAM | CUTANEOUS | Status: DC | PRN

## 2017-06-25 MED ORDER — PENTAFLUOROPROP-TETRAFLUOROETH EX AERO: 1.0000 "application " | INHALATION_SPRAY | CUTANEOUS | Status: DC | PRN

## 2017-06-25 MED ORDER — DICYCLOMINE HCL 20 MG PO TABS
20.0000 mg | ORAL_TABLET | Freq: Four times a day (QID) | ORAL | Status: DC | PRN
  Administered 2017-06-25: 20 mg via ORAL
  Filled 2017-06-25: qty 1

## 2017-06-25 MED ORDER — HYDRALAZINE HCL 50 MG PO TABS
100.0000 mg | ORAL_TABLET | Freq: Two times a day (BID) | ORAL | Status: DC
  Administered 2017-06-25 – 2017-06-27 (×5): 100 mg via ORAL
  Filled 2017-06-25 (×5): qty 2

## 2017-06-25 MED ORDER — PROMETHAZINE HCL 25 MG PO TABS
25.0000 mg | ORAL_TABLET | Freq: Four times a day (QID) | ORAL | Status: DC | PRN
  Administered 2017-06-26: 25 mg via ORAL
  Filled 2017-06-25: qty 1

## 2017-06-25 MED ORDER — HEPARIN SODIUM (PORCINE) 1000 UNIT/ML DIALYSIS: 1000.0000 [IU] | INTRAMUSCULAR | Status: DC | PRN

## 2017-06-25 MED ORDER — OXYCODONE HCL 5 MG PO TABS
5.0000 mg | ORAL_TABLET | ORAL | Status: DC | PRN
Start: 2017-06-25 — End: 2017-06-27
  Administered 2017-06-25 – 2017-06-27 (×8): 5 mg via ORAL
  Filled 2017-06-25 (×8): qty 1

## 2017-06-25 MED ORDER — SEVELAMER CARBONATE 800 MG PO TABS
800.0000 mg | ORAL_TABLET | Freq: Three times a day (TID) | ORAL | Status: DC
  Administered 2017-06-25 – 2017-06-26 (×2): 800 mg via ORAL
  Filled 2017-06-25 (×2): qty 1

## 2017-06-25 MED ORDER — LIDOCAINE HCL (PF) 1 % IJ SOLN: 5.0000 mL | INTRAMUSCULAR | Status: DC | PRN

## 2017-06-25 MED ORDER — TRAMADOL HCL 50 MG PO TABS
50.0000 mg | ORAL_TABLET | Freq: Two times a day (BID) | ORAL | Status: DC | PRN
  Administered 2017-06-25: 50 mg via ORAL
  Filled 2017-06-25: qty 1

## 2017-06-25 MED ORDER — CALCITRIOL 0.5 MCG PO CAPS
3.7500 ug | ORAL_CAPSULE | ORAL | Status: DC
  Administered 2017-06-26: 3.75 ug via ORAL

## 2017-06-25 MED ORDER — PANTOPRAZOLE SODIUM 40 MG PO TBEC
40.0000 mg | DELAYED_RELEASE_TABLET | Freq: Two times a day (BID) | ORAL | Status: DC
  Administered 2017-06-25 – 2017-06-27 (×5): 40 mg via ORAL
  Filled 2017-06-25 (×5): qty 1
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
## 2017-06-25 NOTE — Progress Notes (Addendum)
HD tx completed w/ cramping issues throughout which the pt required the UF to be turned off early into treatment and wouldn't let me turn it back on, UF goal not met, blood rinsed back, VSS, report called to [facility]
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
## 2017-06-25 NOTE — Progress Notes (Signed)
HD tx initiated via 15G x2 w/o problem, pull/push/flush equally w/o problem, VSS w/ high bp, will cont to monitor while on HD tx

## 2017-06-25 NOTE — Progress Notes (Signed)
ANTICOAGULATION CONSULT NOTE - Initial Consult  Pharmacy Consult for warfarin Indication: h/o DVT  Allergies  Allergen Reactions  . Butalbital-Apap-Caffeine Shortness Of Breath, Swelling and Other (See Comments)    Swelling in throat  . Ferrlecit [Na Ferric Gluc Cplx In Sucrose] Shortness Of Breath, Swelling and Other (See Comments)    Swelling in throat, tolerates Venofor  . Minoxidil Shortness Of Breath  . Tylenol [Acetaminophen] Anaphylaxis and Swelling  . Darvocet [Propoxyphene N-Acetaminophen] Hives    Patient Measurements: Weight: (UTA, pt on ED stretcher, can't stand right now)   Vital Signs: Temp: 97.7 F (36.5 C) (12/05 1032) Temp Source: Oral (12/05 1032) BP: 145/101 (12/05 1032) Pulse Rate: 95 (12/05 1032)  Labs: Recent Labs    06/24/17 2003 06/25/17 0935  HGB 11.1* 10.1*  HCT 33.6* 31.4*  PLT 170 160  APTT  --  40*  LABPROT  --  16.7*  INR  --  1.36  CREATININE 14.80* 8.40*    Estimated Creatinine Clearance: 10.8 mL/min (A) (by C-G formula based on SCr of 8.4 mg/dL (H)).  Assessment: 44 YOM well known to pharmacy on warfarin for history of DVT. Uncertain of when clot occurred and number of DVTs/PEs patient has had.  Chronic noncompliance. Admission INR low at 1.36- per records, he is to be taking warfarin 78m daily.  Hgb 10.1, plts 160- no bleeding noted.  Goal of Therapy:  INR 2-3 Monitor platelets by anticoagulation protocol: Yes   Plan:  Warfarin 7.544mpo x1 tonight Daily INR Follow s/s bleeding  Felis Quillin D. Nicklas Mcsweeney, PharmD, BCBraddock Heightslinical Pharmacist Clinical Phone for 06/25/2017 until 3:30pm: [phone] If after 3:30pm, please call main pharmacy at [phone] 06/25/2017 12:40 PM
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
## 2017-06-26 ENCOUNTER — Other Ambulatory Visit: Payer: Self-pay

## 2017-06-26 DIAGNOSIS — R1084 Generalized abdominal pain: Secondary | ICD-10-CM

## 2017-06-26 LAB — CBC
HEMATOCRIT: 31.4 % — AB (ref 39.0–52.0)
HEMOGLOBIN: 10.2 g/dL — AB (ref 13.0–17.0)
MCH: 28.6 pg (ref 26.0–34.0)
MCHC: 32.5 g/dL (ref 30.0–36.0)
MCV: 88 fL (ref 78.0–100.0)
Platelets: 174 10*3/uL (ref 150–400)
RBC: 3.57 MIL/uL — ABNORMAL LOW (ref 4.22–5.81)
RDW: 17.4 % — AB (ref 11.5–15.5)
WBC: 4.4 10*3/uL (ref 4.0–10.5)

## 2017-06-26 LAB — BASIC METABOLIC PANEL
Anion gap: 16 — ABNORMAL HIGH (ref 5–15)
BUN: 62 mg/dL — AB (ref 6–20)
CALCIUM: 8.3 mg/dL — AB (ref 8.9–10.3)
CHLORIDE: 93 mmol/L — AB (ref 101–111)
CO2: 25 mmol/L (ref 22–32)
CREATININE: 10.63 mg/dL — AB (ref 0.61–1.24)
GFR calc Af Amer: 6 mL/min — ABNORMAL LOW (ref >60)
GFR calc non Af Amer: 5 mL/min — ABNORMAL LOW (ref >60)
GLUCOSE: 113 mg/dL — AB (ref 65–99)
Potassium: 5 mmol/L (ref 3.5–5.1)
Sodium: 134 mmol/L — ABNORMAL LOW (ref 135–145)

## 2017-06-26 LAB — PROTIME-INR
INR: 1.3
Prothrombin Time: 16.1 seconds — ABNORMAL HIGH (ref 11.4–15.2)

## 2017-06-26 LAB — GLUCOSE, CAPILLARY: Glucose-Capillary: 93 mg/dL (ref 65–99)

## 2017-06-26 MED ORDER — WARFARIN SODIUM 7.5 MG PO TABS
7.5000 mg | ORAL_TABLET | Freq: Once | ORAL | Status: AC
  Administered 2017-06-26: 7.5 mg via ORAL
  Filled 2017-06-26: qty 1

## 2017-06-26 MED ORDER — SEVELAMER CARBONATE 800 MG PO TABS
1600.0000 mg | ORAL_TABLET | Freq: Three times a day (TID) | ORAL | Status: DC
Start: 2017-06-26 — End: 2017-06-27
  Administered 2017-06-26 – 2017-06-27 (×3): 1600 mg via ORAL
  Filled 2017-06-26 (×3): qty 2

## 2017-06-26 MED ORDER — DOCUSATE SODIUM 100 MG PO CAPS: 100.0000 mg | ORAL_CAPSULE | Freq: Two times a day (BID) | ORAL | 0 refills | Status: DC

## 2017-06-26 MED ORDER — PRO-STAT SUGAR FREE PO LIQD
30.0000 mL | Freq: Two times a day (BID) | ORAL | Status: DC
  Filled 2017-06-26 (×3): qty 30

## 2017-06-26 MED ORDER — POLYETHYLENE GLYCOL 3350 17 G PO PACK
17.0000 g | PACK | Freq: Every day | ORAL | Status: DC
  Administered 2017-06-27: 17 g via ORAL
  Filled 2017-06-26 (×2): qty 1

## 2017-06-26 MED ORDER — METHOCARBAMOL 500 MG PO TABS
500.0000 mg | ORAL_TABLET | Freq: Three times a day (TID) | ORAL | Status: DC
  Administered 2017-06-26 – 2017-06-27 (×4): 500 mg via ORAL
  Filled 2017-06-26 (×4): qty 1

## 2017-06-26 MED ORDER — DOCUSATE SODIUM 100 MG PO CAPS
100.0000 mg | ORAL_CAPSULE | Freq: Two times a day (BID) | ORAL | Status: DC
  Administered 2017-06-26 – 2017-06-27 (×2): 100 mg via ORAL
  Filled 2017-06-26 (×3): qty 1

## 2017-06-26 MED ORDER — CALCITRIOL 0.25 MCG PO CAPS
ORAL_CAPSULE | ORAL | Status: AC
  Filled 2017-06-26: qty 1

## 2017-06-26 MED ORDER — CALCITRIOL 0.5 MCG PO CAPS
ORAL_CAPSULE | ORAL | Status: AC
Start: 2017-06-26 — End: 2017-06-27
  Filled 2017-06-26: qty 7

## 2017-06-26 MED ORDER — ONDANSETRON HCL 4 MG PO TABS
4.0000 mg | ORAL_TABLET | Freq: Three times a day (TID) | ORAL | Status: DC | PRN
  Filled 2017-06-26: qty 1
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
## 2017-06-26 NOTE — Procedures (Signed)
Patient seen on Hemodialysis. QB 400, UF goal 4.5L Treatment adjusted as needed.  Elmarie Shiley MD [facility]. Office # [phone] Pager # [phone] 1:59 PM

## 2017-06-26 NOTE — Progress Notes (Signed)
Orthopedic Tech Progress Note Patient Details:  Frank [name] Nov 06, 1965 [id number]  Ortho Devices Type of Ortho Device: Shoulder immobilizer   Post Interventions Instructions Provided: Care of device   [name] 06/26/2017, 3:10 PM

## 2017-06-26 NOTE — Progress Notes (Signed)
ANTICOAGULATION CONSULT NOTE - Initial Consult  Pharmacy Consult for warfarin Indication: h/o DVT   Assessment: 31 YOM well known to pharmacy on warfarin for history of DVT. Per some chart review, patient had RUE DVT in late August of this year- notes from that admission state it was patient's second clot. Patient left AMA during that admission, and also left with subtherapeutic INR in September (did not want to be on heparin per notes at that time).  Chronic noncompliance. Admission INR low at 1.36- per records, he is to be taking warfarin 18m daily. INR today 1.3  CBC is stable, no bleeding noted.  Goal of Therapy:  INR 2-3 Monitor platelets by anticoagulation protocol: Yes   Plan:  Warfarin 7.5435mpo x1 tonight Daily INR Follow s/s bleeding Could try a DOAC for long term management- patient does qualify for Eliquis 35m27mO BID. Defer to medical team to determine how long patient should continue on anticoagulation- uncertain if clots were provoked or if he ever had heme workup.  Corrigan Kretschmer D. Darrick Greenlaw, PharmD, BCPS Clinical Pharmacist Clinical Phone for 06/26/2017 until 3:30pm: [phone] If after 3:30pm, please call main pharmacy at [phone]/12/2016 9:59 AM   Allergies  Allergen Reactions  . Butalbital-Apap-Caffeine Shortness Of Breath, Swelling and Other (See Comments)    Swelling in throat  . Ferrlecit [Na Ferric Gluc Cplx In Sucrose] Shortness Of Breath, Swelling and Other (See Comments)    Swelling in throat, tolerates Venofor  . Minoxidil Shortness Of Breath  . Tylenol [Acetaminophen] Anaphylaxis and Swelling  . Darvocet [Propoxyphene N-Acetaminophen] Hives    Patient Measurements: Weight: (UTA, pt on ED stretcher, can't stand right now)   Vital Signs: BP: 154/104 (12/06 0811) Pulse Rate: 96 (12/06 0811)  Labs: Recent Labs    06/24/17 2003 06/25/17 0935 06/26/17 0518  HGB 11.1* 10.1* 10.2*  HCT 33.6* 31.4* 31.4*  PLT 170 160 174  APTT  --  40*  --   LABPROT  --   16.7* 16.1*  INR  --  1.36 1.30  CREATININE 14.80* 8.40* 10.63*    Estimated Creatinine Clearance: 8.5 mL/min (A) (by C-G formula based on SCr of 10.63 mg/dL (H)).

## 2017-06-26 NOTE — Progress Notes (Signed)
RT NOTE:  Pt has CPAP @ bedside and states he is able to put on when he is ready to go to sleep. Pt had added sterile water to humidifier. RT will monitor.
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
## 2017-06-27 LAB — RENAL FUNCTION PANEL
Albumin: 3.1 g/dL — ABNORMAL LOW (ref 3.5–5.0)
Anion gap: 12 (ref 5–15)
BUN: 36 mg/dL — ABNORMAL HIGH (ref 6–20)
CALCIUM: 8.9 mg/dL (ref 8.9–10.3)
CO2: 28 mmol/L (ref 22–32)
CREATININE: 7.94 mg/dL — AB (ref 0.61–1.24)
Chloride: 96 mmol/L — ABNORMAL LOW (ref 101–111)
GFR calc non Af Amer: 7 mL/min — ABNORMAL LOW (ref >60)
GFR, EST AFRICAN AMERICAN: 8 mL/min — AB (ref >60)
GLUCOSE: 127 mg/dL — AB (ref 65–99)
Phosphorus: 5.1 mg/dL — ABNORMAL HIGH (ref 2.5–4.6)
Potassium: 4.3 mmol/L (ref 3.5–5.1)
SODIUM: 136 mmol/L (ref 135–145)

## 2017-06-27 LAB — GLUCOSE, CAPILLARY
GLUCOSE-CAPILLARY: 77 mg/dL (ref 65–99)
GLUCOSE-CAPILLARY: 87 mg/dL (ref 65–99)
Glucose-Capillary: 81 mg/dL (ref 65–99)

## 2017-06-27 LAB — PROTIME-INR
INR: 1.17
PROTHROMBIN TIME: 14.8 s (ref 11.4–15.2)

## 2017-06-27 MED ORDER — ONDANSETRON 4 MG PO TBDP
4.0000 mg | ORAL_TABLET | Freq: Three times a day (TID) | ORAL | Status: DC | PRN
  Administered 2017-06-27: 4 mg via ORAL
  Filled 2017-06-27: qty 1

## 2017-06-27 MED ORDER — OXYCODONE HCL 5 MG PO TABS: 5.0000 mg | ORAL_TABLET | ORAL | 0 refills | Status: DC | PRN

## 2017-06-27 MED ORDER — ONDANSETRON HCL 4 MG/2ML IJ SOLN
4.0000 mg | Freq: Four times a day (QID) | INTRAMUSCULAR | Status: DC | PRN
  Administered 2017-06-27: 4 mg via INTRAVENOUS
  Filled 2017-06-27: qty 2

## 2017-06-27 MED ORDER — TRAMADOL HCL 50 MG PO TABS: 50.0000 mg | ORAL_TABLET | Freq: Two times a day (BID) | ORAL | 0 refills | Status: DC | PRN

## 2017-06-27 MED ORDER — WARFARIN SODIUM 10 MG PO TABS: 10.0000 mg | ORAL_TABLET | Freq: Once | ORAL | Status: DC

## 2017-06-27 NOTE — Progress Notes (Signed)
ANTICOAGULATION CONSULT NOTE - Initial Consult  Pharmacy Consult for warfarin Indication: h/o DVT   Assessment: 84 YOM well known to pharmacy on warfarin for history of DVT. Per some chart review, patient had RUE DVT in late August of this year- notes from that admission state it was patient's second clot. Patient left AMA during that admission, and also left with subtherapeutic INR in September (did not want to be on heparin per notes at that time).  Chronic noncompliance. Admission INR low at 1.36- per records, he is to be taking warfarin 41m daily. INR today 1.17  CBC is stable, no bleeding noted.  Goal of Therapy:  INR 2-3 Monitor platelets by anticoagulation protocol: Yes   Plan:  Increase Warfarin 1862mpo x1 tonight Daily INR Follow s/s bleeding Could try a DOAC for long term management- patient does qualify for Eliquis [id number] BID. Defer to medical team to determine how long patient should continue on anticoagulation- uncertain if clots were provoked or if he ever had heme workup.  KimManpower Incharm.D., BCPS Clinical Pharmacist Pager: [phone] phone for 06/27/2017 from 8:30-4:00 is [phone]. After 4pm, please call Main Rx [phone]) for assistance. 06/27/2017 8:30 AM   Allergies  Allergen Reactions  . Butalbital-Apap-Caffeine Shortness Of Breath, Swelling and Other (See Comments)    Swelling in throat  . Ferrlecit [Na Ferric Gluc Cplx In Sucrose] Shortness Of Breath, Swelling and Other (See Comments)    Swelling in throat, tolerates Venofor  . Minoxidil Shortness Of Breath  . Tylenol [Acetaminophen] Anaphylaxis and Swelling  . Darvocet [Propoxyphene N-Acetaminophen] Hives    Patient Measurements: Weight: 165 lb 9.1 oz (75.1 kg)   Vital Signs: Temp: 98.2 F (36.8 C) (12/07 0501) Temp Source: Oral (12/07 0501) BP: 152/100 (12/07 0501) Pulse Rate: 78 (12/07 0501)  Labs: Recent Labs    06/24/17 2003 06/25/17 0935 06/26/17 0518 06/27/17 0338   HGB 11.1* 10.1* 10.2*  --   HCT 33.6* 31.4* 31.4*  --   PLT 170 160 174  --   APTT  --  40*  --   --   LABPROT  --  16.7* 16.1* 14.8  INR  --  1.36 1.30 1.17  CREATININE 14.80* 8.40* 10.63*  --     Estimated Creatinine Clearance: 8.5 mL/min (A) (by C-G formula based on SCr of 10.63 mg/dL (H)).
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
## 2017-06-27 NOTE — Progress Notes (Signed)
Pt. c/o  vomiting- noted pt. spitting in sink often; no vomitus noted; Phenergan po given and minuetes later pt. States he vomited. Thought pt. was Hospitalist and paged and RTC and orders given. Pt. states that he vomited Zofran ODT also; paged FM [phone] and Dr. Vanetta Shawl RTC 0028 - informed her of other orders and okay with orders; informed of pt. stating that he's vomiting and wants Zofran IV and wants to see Dr.- he doesn't know what's going on with him. Pt. very demanding at present; no distress noted.; orders given and she states that she will not be seeing pt. tonight that someone will check on him in the am and pt. informed.

## 2017-06-27 NOTE — Plan of Care (Signed)
  Education: Knowledge of General Education information will improve 06/27/2017 0353 - Progressing by Anson Fret, RN Note POC and orders reviewed with pt.
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
## 2017-06-27 NOTE — Progress Notes (Signed)
Patient discharged to home, AVS reviewed, prescriptions provided, IV removed

## 2017-06-29 ENCOUNTER — Emergency Department (HOSPITAL_COMMUNITY): Payer: Medicare Other

## 2017-06-29 ENCOUNTER — Encounter (HOSPITAL_COMMUNITY): Payer: Self-pay | Admitting: Emergency Medicine

## 2017-06-29 ENCOUNTER — Emergency Department (HOSPITAL_COMMUNITY)
Admission: EM | Admit: 2017-06-29 | Discharge: 2017-06-30 | Disposition: A | Discharge status: Home / Self Care | Payer: Medicare Other | Attending: Emergency Medicine | Admitting: Emergency Medicine

## 2017-06-29 DIAGNOSIS — I132 Hypertensive heart and chronic kidney disease with heart failure and with stage 5 chronic kidney disease, or end stage renal disease: Secondary | ICD-10-CM | POA: Insufficient documentation

## 2017-06-29 DIAGNOSIS — R112 Nausea with vomiting, unspecified: Secondary | ICD-10-CM | POA: Diagnosis not present

## 2017-06-29 DIAGNOSIS — Z79899 Other long term (current) drug therapy: Secondary | ICD-10-CM | POA: Insufficient documentation

## 2017-06-29 DIAGNOSIS — I10 Essential (primary) hypertension: Secondary | ICD-10-CM | POA: Diagnosis not present

## 2017-06-29 DIAGNOSIS — Z992 Dependence on renal dialysis: Secondary | ICD-10-CM | POA: Diagnosis not present

## 2017-06-29 DIAGNOSIS — I5042 Chronic combined systolic (congestive) and diastolic (congestive) heart failure: Secondary | ICD-10-CM | POA: Insufficient documentation

## 2017-06-29 DIAGNOSIS — Z7901 Long term (current) use of anticoagulants: Secondary | ICD-10-CM | POA: Diagnosis not present

## 2017-06-29 DIAGNOSIS — E875 Hyperkalemia: Secondary | ICD-10-CM | POA: Diagnosis not present

## 2017-06-29 DIAGNOSIS — R1013 Epigastric pain: Secondary | ICD-10-CM

## 2017-06-29 DIAGNOSIS — N186 End stage renal disease: Secondary | ICD-10-CM | POA: Insufficient documentation

## 2017-06-29 DIAGNOSIS — I110 Hypertensive heart disease with heart failure: Secondary | ICD-10-CM | POA: Diagnosis not present

## 2017-06-29 DIAGNOSIS — Z87891 Personal history of nicotine dependence: Secondary | ICD-10-CM | POA: Insufficient documentation

## 2017-06-29 DIAGNOSIS — R079 Chest pain, unspecified: Secondary | ICD-10-CM | POA: Diagnosis not present

## 2017-06-29 DIAGNOSIS — Z4931 Encounter for adequacy testing for hemodialysis: Secondary | ICD-10-CM | POA: Diagnosis not present

## 2017-06-29 DIAGNOSIS — E1122 Type 2 diabetes mellitus with diabetic chronic kidney disease: Secondary | ICD-10-CM | POA: Insufficient documentation

## 2017-06-29 LAB — I-STAT TROPONIN, ED: TROPONIN I, POC: 0.05 ng/mL (ref 0.00–0.08)

## 2017-06-29 LAB — I-STAT CHEM 8, ED
BUN: 87 mg/dL — ABNORMAL HIGH (ref 6–20)
CALCIUM ION: 0.98 mmol/L — AB (ref 1.15–1.40)
Chloride: 100 mmol/L — ABNORMAL LOW (ref 101–111)
Creatinine, Ser: 13.8 mg/dL — ABNORMAL HIGH (ref 0.61–1.24)
GLUCOSE: 112 mg/dL — AB (ref 65–99)
HCT: 34 % — ABNORMAL LOW (ref 39.0–52.0)
HEMOGLOBIN: 11.6 g/dL — AB (ref 13.0–17.0)
POTASSIUM: 6 mmol/L — AB (ref 3.5–5.1)
Sodium: 138 mmol/L (ref 135–145)
TCO2: 26 mmol/L (ref 22–32)

## 2017-06-29 LAB — CBC WITH DIFFERENTIAL/PLATELET
BASOS ABS: 0 10*3/uL (ref 0.0–0.1)
BASOS PCT: 0 %
EOS PCT: 5 %
Eosinophils Absolute: 0.3 10*3/uL (ref 0.0–0.7)
HCT: 32.3 % — ABNORMAL LOW (ref 39.0–52.0)
Hemoglobin: 10.7 g/dL — ABNORMAL LOW (ref 13.0–17.0)
LYMPHS PCT: 17 %
Lymphs Abs: 0.8 10*3/uL (ref 0.7–4.0)
MCH: 29.4 pg (ref 26.0–34.0)
MCHC: 33.1 g/dL (ref 30.0–36.0)
MCV: 88.7 fL (ref 78.0–100.0)
MONO ABS: 0.5 10*3/uL (ref 0.1–1.0)
Monocytes Relative: 12 %
Neutro Abs: 3.1 10*3/uL (ref 1.7–7.7)
Neutrophils Relative %: 66 %
PLATELETS: 196 10*3/uL (ref 150–400)
RBC: 3.64 MIL/uL — ABNORMAL LOW (ref 4.22–5.81)
RDW: 17.4 % — AB (ref 11.5–15.5)
WBC: 4.7 10*3/uL (ref 4.0–10.5)

## 2017-06-29 LAB — PROTIME-INR
INR: 1.39
Prothrombin Time: 16.9 seconds — ABNORMAL HIGH (ref 11.4–15.2)

## 2017-06-29 LAB — BASIC METABOLIC PANEL
ANION GAP: 15 (ref 5–15)
BUN: 96 mg/dL — ABNORMAL HIGH (ref 6–20)
CALCIUM: 8.5 mg/dL — AB (ref 8.9–10.3)
CO2: 25 mmol/L (ref 22–32)
CREATININE: 14.4 mg/dL — AB (ref 0.61–1.24)
Chloride: 96 mmol/L — ABNORMAL LOW (ref 101–111)
GFR calc Af Amer: 4 mL/min — ABNORMAL LOW (ref >60)
GFR, EST NON AFRICAN AMERICAN: 3 mL/min — AB (ref >60)
GLUCOSE: 118 mg/dL — AB (ref 65–99)
Potassium: 6.1 mmol/L — ABNORMAL HIGH (ref 3.5–5.1)
Sodium: 136 mmol/L (ref 135–145)

## 2017-06-29 MED ORDER — HYDROMORPHONE HCL 1 MG/ML IJ SOLN
0.5000 mg | Freq: Once | INTRAMUSCULAR | Status: AC
  Administered 2017-06-29: 0.5 mg via INTRAVENOUS
  Filled 2017-06-29: qty 1

## 2017-06-29 MED ORDER — ALUM & MAG HYDROXIDE-SIMETH 200-200-20 MG/5ML PO SUSP
15.0000 mL | Freq: Once | ORAL | Status: DC
  Filled 2017-06-29: qty 30

## 2017-06-29 MED ORDER — ONDANSETRON HCL 4 MG/2ML IJ SOLN
4.0000 mg | Freq: Once | INTRAMUSCULAR | Status: AC
  Administered 2017-06-29: 4 mg via INTRAVENOUS
  Filled 2017-06-29: qty 2

## 2017-06-29 NOTE — ED Triage Notes (Signed)
Patient arrived via EMS. Complaints of mid chest pain with shortness of breath starting this evening. Reports nausea and vomiting 5 times this evening. Patient also states that he missed his dialysis appointment on Saturday due to transportation.
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
## 2017-06-30 ENCOUNTER — Encounter (HOSPITAL_COMMUNITY): Payer: Self-pay

## 2017-06-30 ENCOUNTER — Non-Acute Institutional Stay (HOSPITAL_COMMUNITY)
Admission: EM | Admit: 2017-06-30 | Discharge: 2017-06-30 | Disposition: A | Discharge status: Home / Self Care | Payer: Medicare Other | Source: Home / Self Care | Attending: Emergency Medicine | Admitting: Emergency Medicine

## 2017-06-30 DIAGNOSIS — Z79899 Other long term (current) drug therapy: Secondary | ICD-10-CM

## 2017-06-30 DIAGNOSIS — Z940 Kidney transplant status: Secondary | ICD-10-CM | POA: Insufficient documentation

## 2017-06-30 DIAGNOSIS — Z86718 Personal history of other venous thrombosis and embolism: Secondary | ICD-10-CM | POA: Insufficient documentation

## 2017-06-30 DIAGNOSIS — I700 Atherosclerosis of aorta: Secondary | ICD-10-CM | POA: Insufficient documentation

## 2017-06-30 DIAGNOSIS — Z4931 Encounter for adequacy testing for hemodialysis: Secondary | ICD-10-CM | POA: Diagnosis not present

## 2017-06-30 DIAGNOSIS — Z992 Dependence on renal dialysis: Secondary | ICD-10-CM | POA: Insufficient documentation

## 2017-06-30 DIAGNOSIS — K219 Gastro-esophageal reflux disease without esophagitis: Secondary | ICD-10-CM | POA: Insufficient documentation

## 2017-06-30 DIAGNOSIS — E1122 Type 2 diabetes mellitus with diabetic chronic kidney disease: Secondary | ICD-10-CM

## 2017-06-30 DIAGNOSIS — I5042 Chronic combined systolic (congestive) and diastolic (congestive) heart failure: Secondary | ICD-10-CM

## 2017-06-30 DIAGNOSIS — Z7901 Long term (current) use of anticoagulants: Secondary | ICD-10-CM | POA: Insufficient documentation

## 2017-06-30 DIAGNOSIS — N186 End stage renal disease: Secondary | ICD-10-CM | POA: Insufficient documentation

## 2017-06-30 DIAGNOSIS — D631 Anemia in chronic kidney disease: Secondary | ICD-10-CM

## 2017-06-30 DIAGNOSIS — E875 Hyperkalemia: Secondary | ICD-10-CM | POA: Diagnosis present

## 2017-06-30 DIAGNOSIS — I428 Other cardiomyopathies: Secondary | ICD-10-CM | POA: Insufficient documentation

## 2017-06-30 DIAGNOSIS — Z87891 Personal history of nicotine dependence: Secondary | ICD-10-CM

## 2017-06-30 DIAGNOSIS — I132 Hypertensive heart and chronic kidney disease with heart failure and with stage 5 chronic kidney disease, or end stage renal disease: Secondary | ICD-10-CM

## 2017-06-30 LAB — HEPATIC FUNCTION PANEL
ALK PHOS: 109 U/L (ref 38–126)
ALT: 20 U/L (ref 17–63)
AST: 39 U/L (ref 15–41)
Albumin: 3.2 g/dL — ABNORMAL LOW (ref 3.5–5.0)
BILIRUBIN TOTAL: 0.8 mg/dL (ref 0.3–1.2)
Bilirubin, Direct: <0.1 mg/dL — ABNORMAL LOW (ref 0.1–0.5)
Total Protein: 7.3 g/dL (ref 6.5–8.1)

## 2017-06-30 LAB — LIPASE, BLOOD: LIPASE: 27 U/L (ref 11–51)

## 2017-06-30 MED ORDER — HEPARIN SODIUM (PORCINE) 1000 UNIT/ML DIALYSIS
20.0000 [IU]/kg | INTRAMUSCULAR | Status: DC | PRN
  Filled 2017-06-30: qty 2

## 2017-06-30 MED ORDER — SODIUM CHLORIDE 0.9 % IV SOLN: 100.0000 mL | INTRAVENOUS | Status: DC | PRN

## 2017-06-30 MED ORDER — HEPARIN SODIUM (PORCINE) 1000 UNIT/ML DIALYSIS: 20.0000 [IU]/kg | INTRAMUSCULAR | Status: DC | PRN

## 2017-06-30 MED ORDER — HEPARIN SODIUM (PORCINE) 1000 UNIT/ML DIALYSIS: 1000.0000 [IU] | INTRAMUSCULAR | Status: DC | PRN

## 2017-06-30 MED ORDER — LIDOCAINE HCL (PF) 1 % IJ SOLN
5.0000 mL | INTRAMUSCULAR | Status: DC | PRN
  Filled 2017-06-30: qty 5

## 2017-06-30 MED ORDER — ALTEPLASE 2 MG IJ SOLR
2.0000 mg | Freq: Once | INTRAMUSCULAR | Status: DC | PRN
  Filled 2017-06-30: qty 2

## 2017-06-30 MED ORDER — HEPARIN SODIUM (PORCINE) 1000 UNIT/ML DIALYSIS
1000.0000 [IU] | INTRAMUSCULAR | Status: DC | PRN
  Filled 2017-06-30: qty 1

## 2017-06-30 MED ORDER — LIDOCAINE-PRILOCAINE 2.5-2.5 % EX CREA
1.0000 "application " | TOPICAL_CREAM | CUTANEOUS | Status: DC | PRN
  Filled 2017-06-30: qty 5

## 2017-06-30 MED ORDER — PENTAFLUOROPROP-TETRAFLUOROETH EX AERO
1.0000 "application " | INHALATION_SPRAY | CUTANEOUS | Status: DC | PRN
  Filled 2017-06-30: qty 30

## 2017-06-30 MED ORDER — CALCITRIOL 0.5 MCG PO CAPS: 4.2500 ug | ORAL_CAPSULE | Freq: Once | ORAL | Status: DC
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
## 2017-06-30 NOTE — ED Notes (Signed)
Pt given warm [place] sandwich per Rod Holler (RN)

## 2017-06-30 NOTE — Discharge Instructions (Signed)
Call your dialysis center and discuss that you are having trouble getting to the center

## 2017-06-30 NOTE — Discharge Instructions (Signed)
You were seen in the ED today for routine dialysis. Our dialysis providers ask that you make arrangements to have your dialysis in [place].

## 2017-06-30 NOTE — ED Notes (Signed)
Pt refused blood draw,   Notified nurse.

## 2017-06-30 NOTE — Progress Notes (Signed)
Pt will have extended HD (abbreviated due to staffing) and then be discharged.  He needs to make arrangements to attend his unit in [place] ; he has not been accepted elsewhere. K 6 - we will use 1 K bath today. Due to shorter 3 hr treatment. Amalia Hailey, PA-C

## 2017-06-30 NOTE — Progress Notes (Signed)
HD tx completed w/o problem other than high bp which is a norm for this pt. UF goal met, blood rinsed back, pt was d/c home from this unit @ 2037. Pt walked out on his own w/o problem and w/ a steady gait. VSS w/his normal elevated bp.  Pt going to ED to pick up cab voucher that the [facility] is leaving for him.  Pt was advised that per MD he is to report to [facility] for his next treatment vs coming back through ED tomorrow and I was advised by MD to let pt know that if he comes back through ED tomorrow that unless he is truly emergent then he wouldn't be ran here tomorrow. Patient's response to that was " I know how to raise my potassium so I will be emergent.  All I have to do is go home and eat something high in potassium. I will be back in here tomorrow.  I will never go back to that [facility]." I educated pt on the reasons not to do that and he agreed w/ me.

## 2017-06-30 NOTE — ED Notes (Signed)
States he needs dialysis. Denies sob. Resting comfortably in bed. Appears in nad

## 2017-06-30 NOTE — ED Triage Notes (Signed)
Per Pt, PT is coming from waiting room. Pt reports that he had blood work done and they discharged him after his potassium "was not high enough to dialysize." Pt requesting to have dialysis done today.

## 2017-07-01 IMAGING — DX DG CHEST 2V
2 series · 2 of 2 positions shown · non-contrast
Comparison: Chest radiograph and CTA of the chest performed
01/31/2015

CLINICAL DATA: Acute onset of generalized chest pain and lethargy.
Shortness of breath. Initial encounter.

EXAM:
CHEST  2 VIEW

[chest lat]
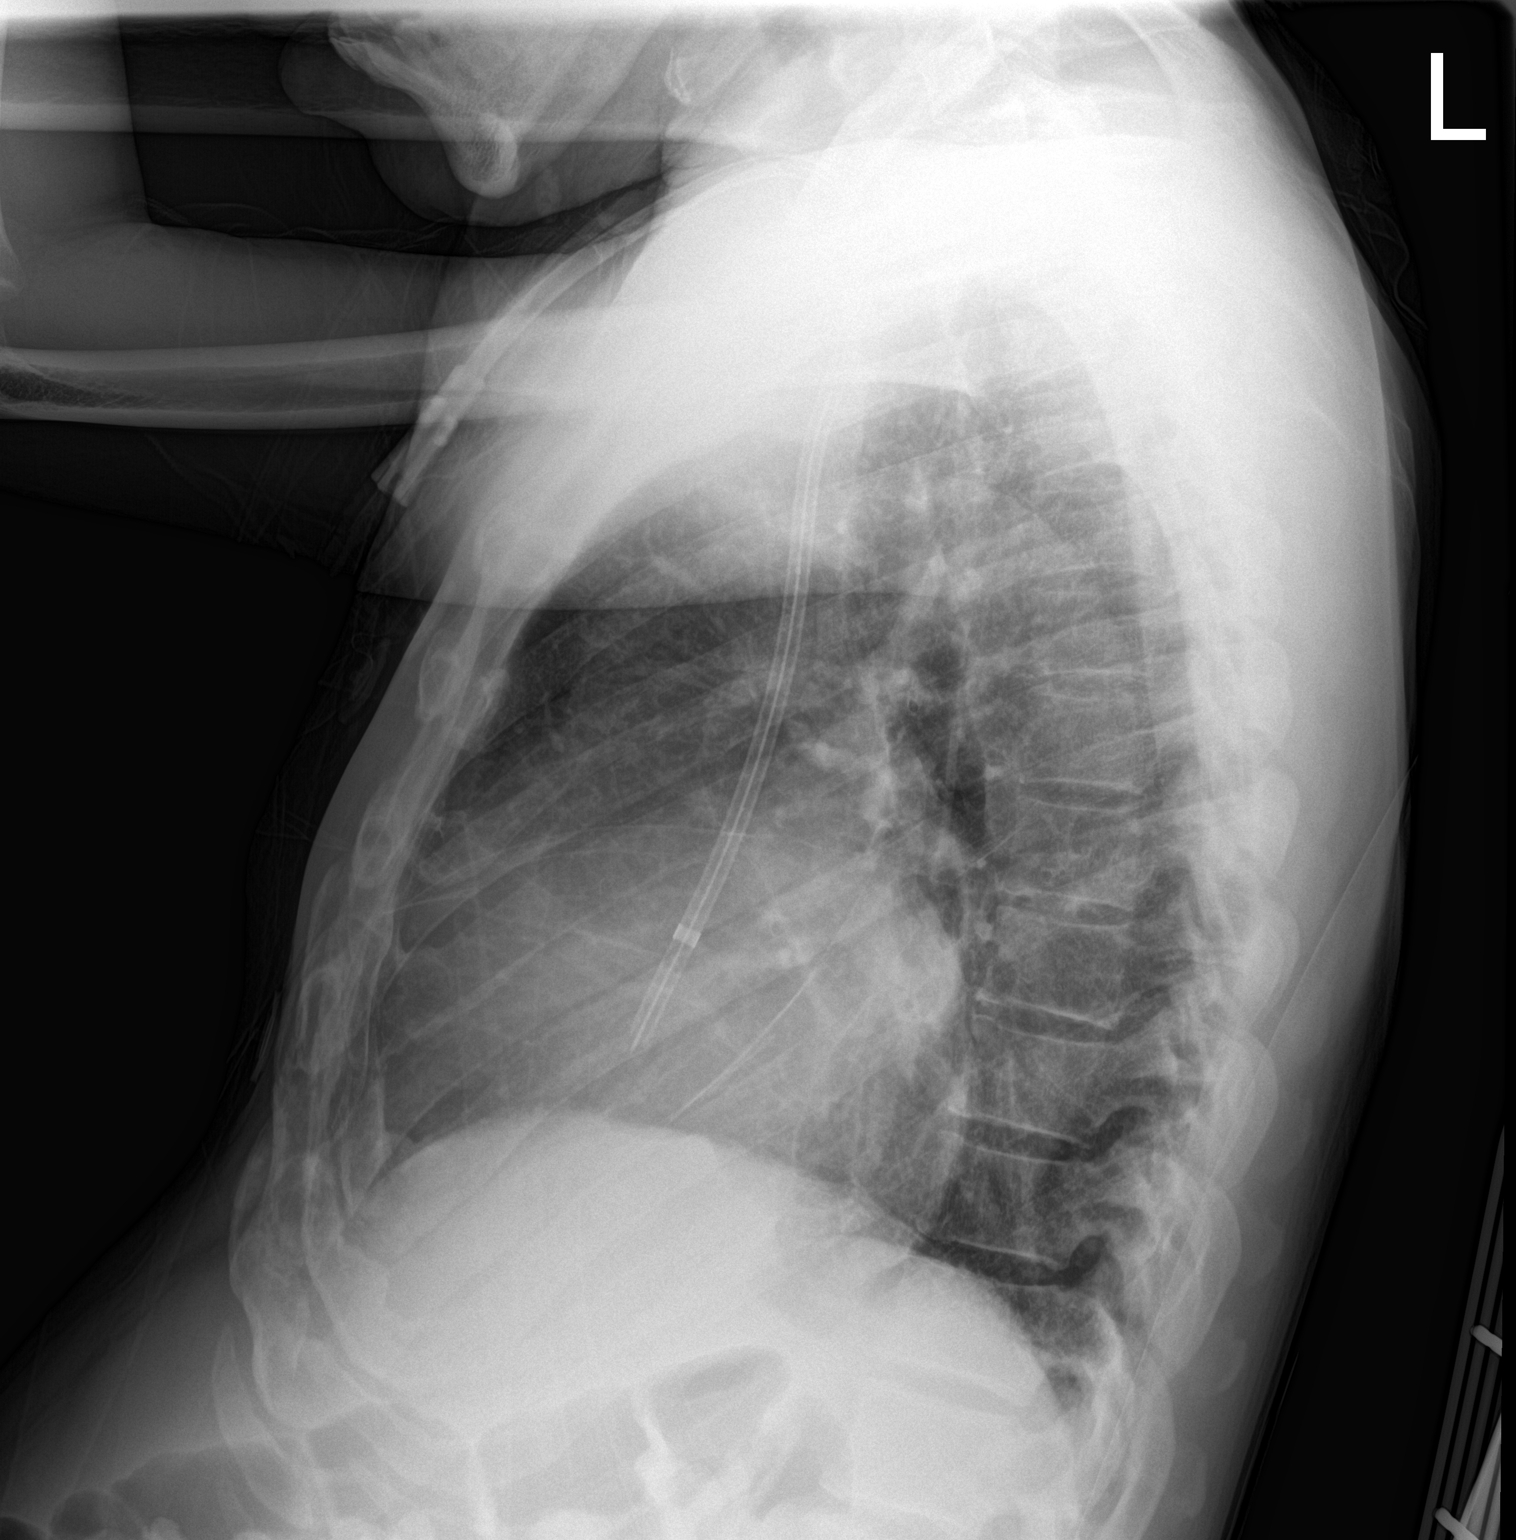

[chest ap]
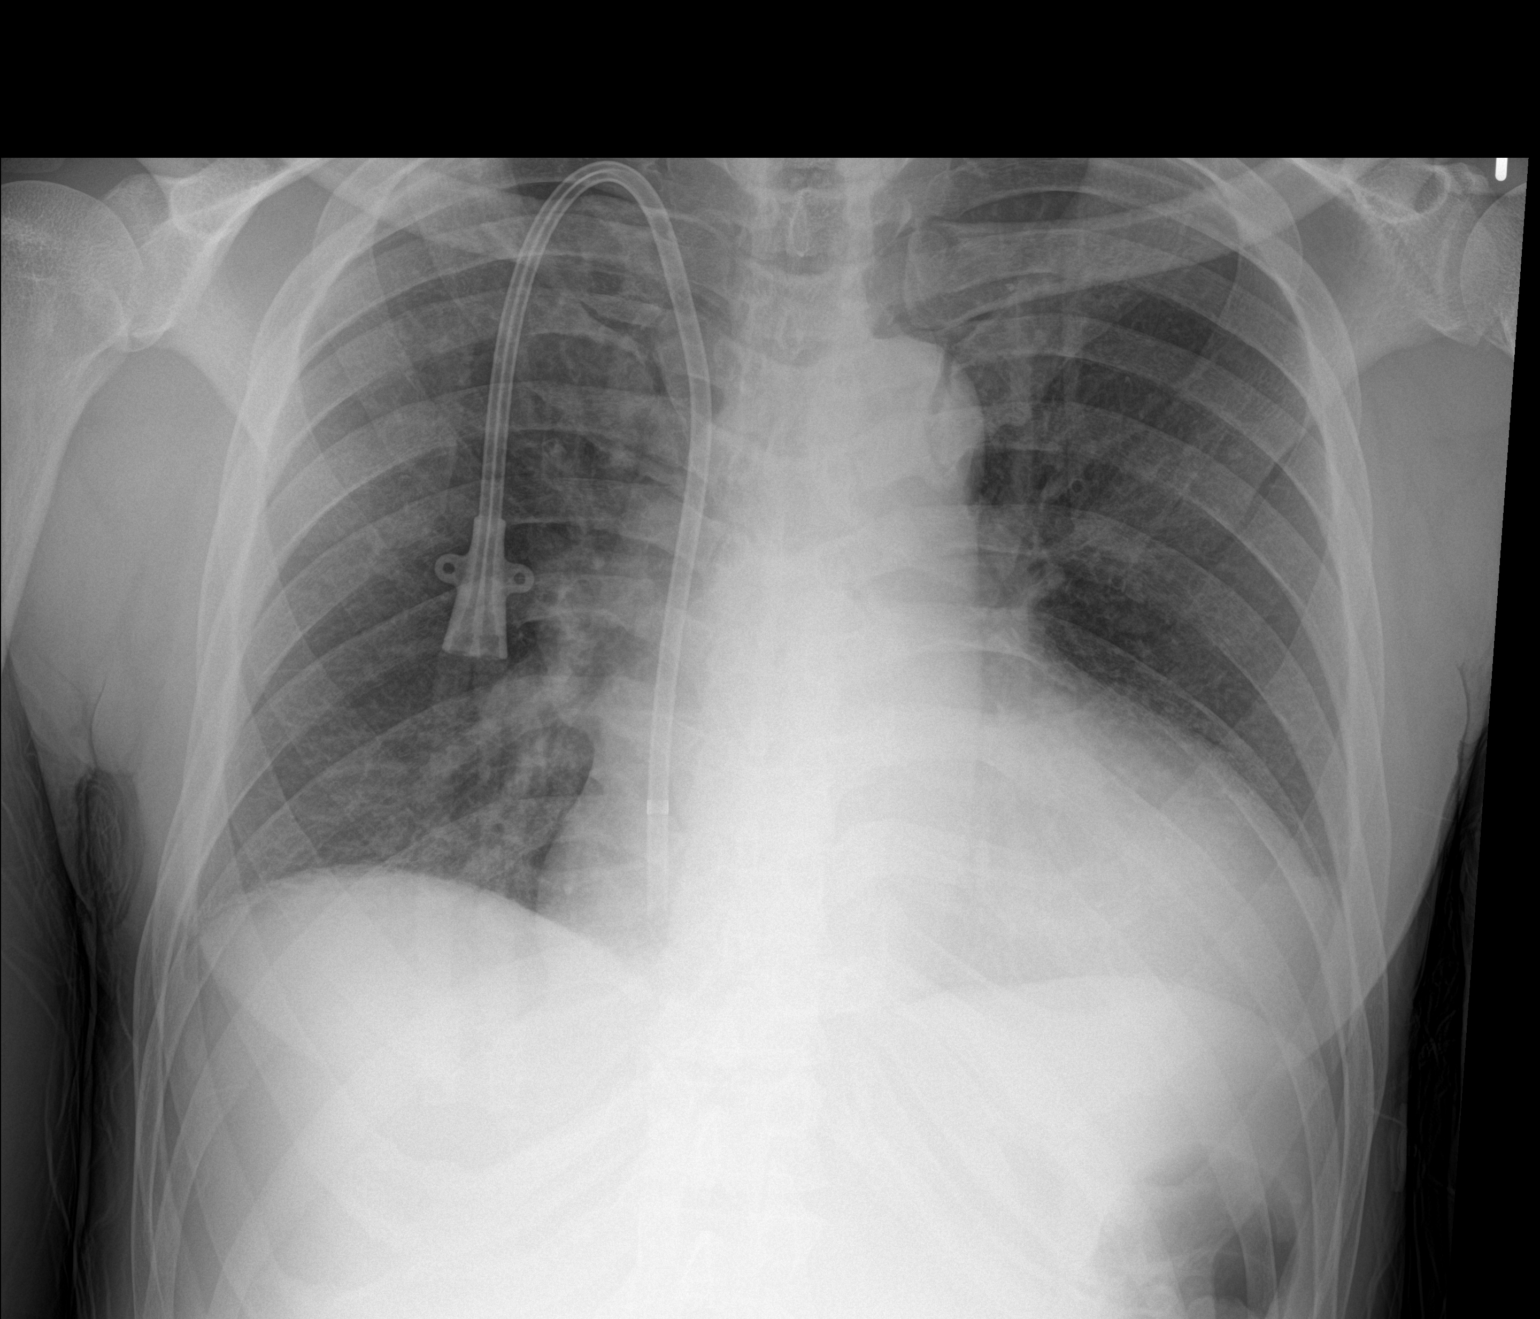

[2 of 2 positions shown; findings below may reference images not displayed]

FINDINGS: The lungs are well-aerated. Mild vascular congestion is noted. There
is no evidence of focal opacification, pleural effusion or
pneumothorax.

The heart is mildly enlarged. A right-sided dual-lumen catheter is
noted ending within the right atrium. No acute osseous abnormalities
are seen.
IMPRESSION: Mild vascular congestion and mild cardiomegaly noted. Lungs remain
grossly clear.

## 2017-07-02 IMAGING — DX DG ABDOMEN 1V
2 series · 2 of 2 positions shown · non-contrast
Comparison: CT of the abdomen and pelvis from 11/05/2014, and
abdominal radiograph performed 12/02/2014

CLINICAL DATA: Acute onset of generalized abdominal pain. Initial
encounter.

EXAM:
ABDOMEN - 1 VIEW

[abdomen kub (1 of 2)]
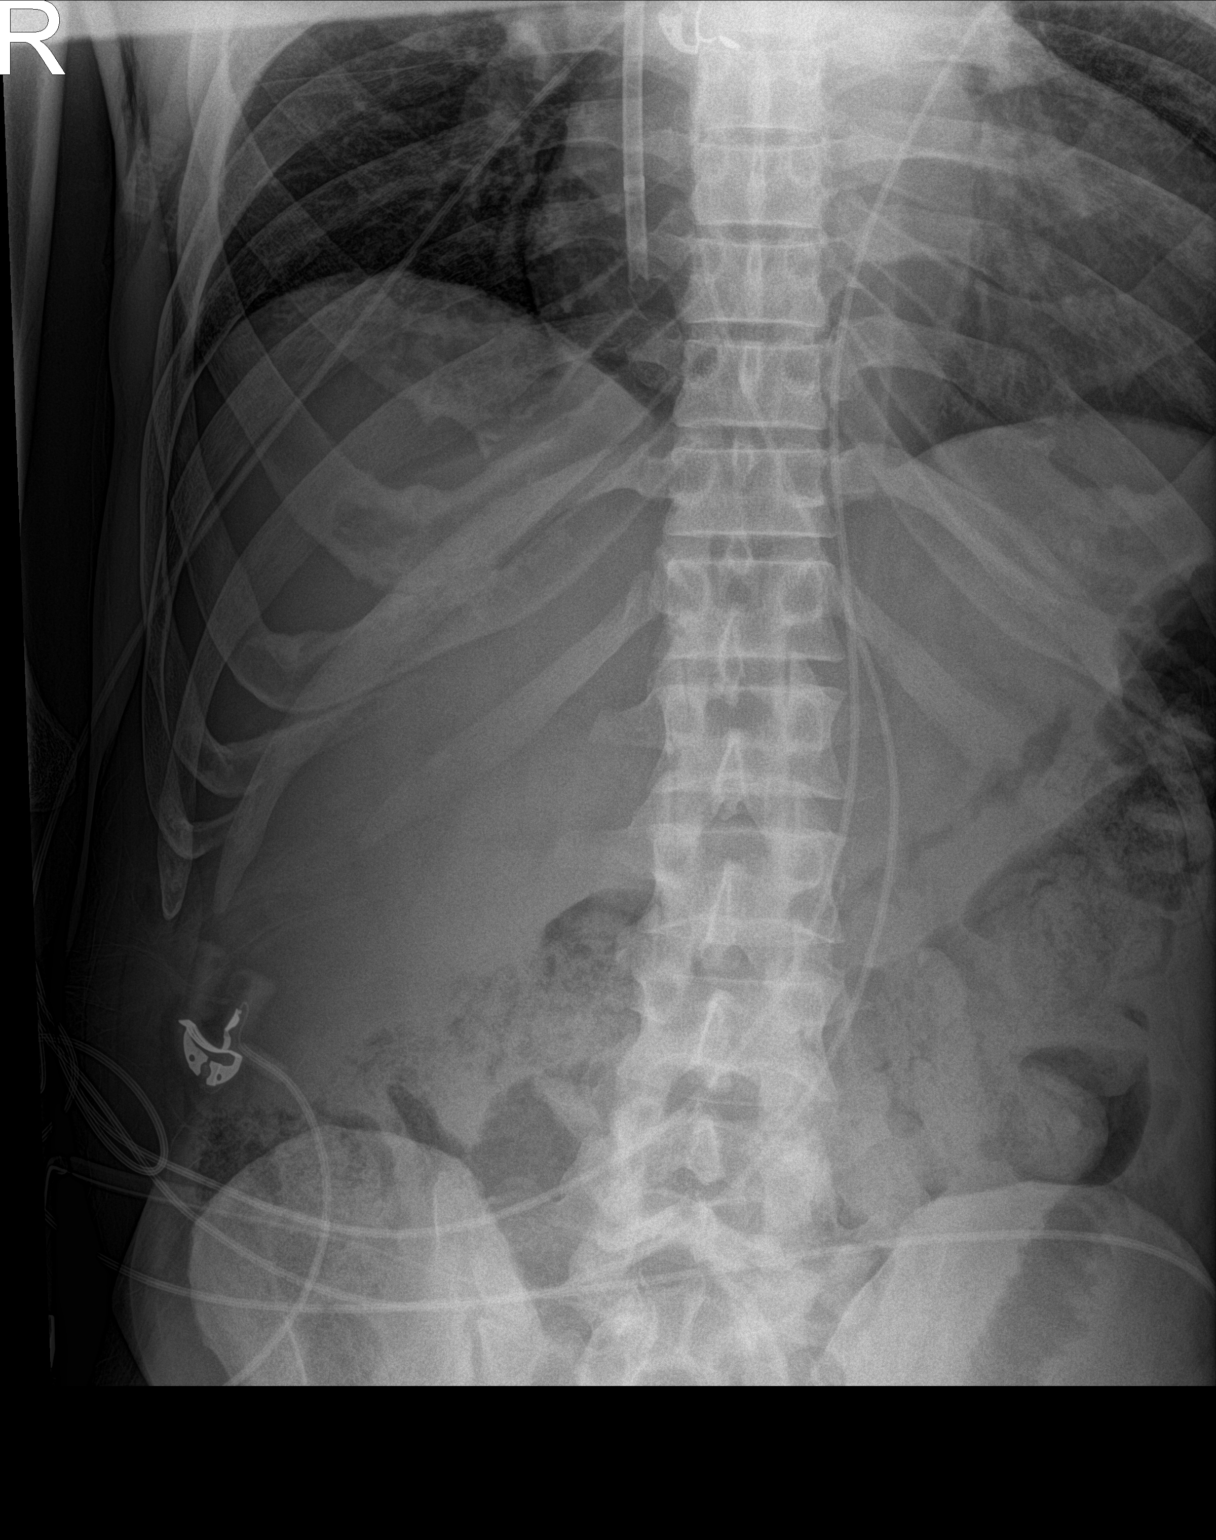

[abdomen kub (2 of 2)]
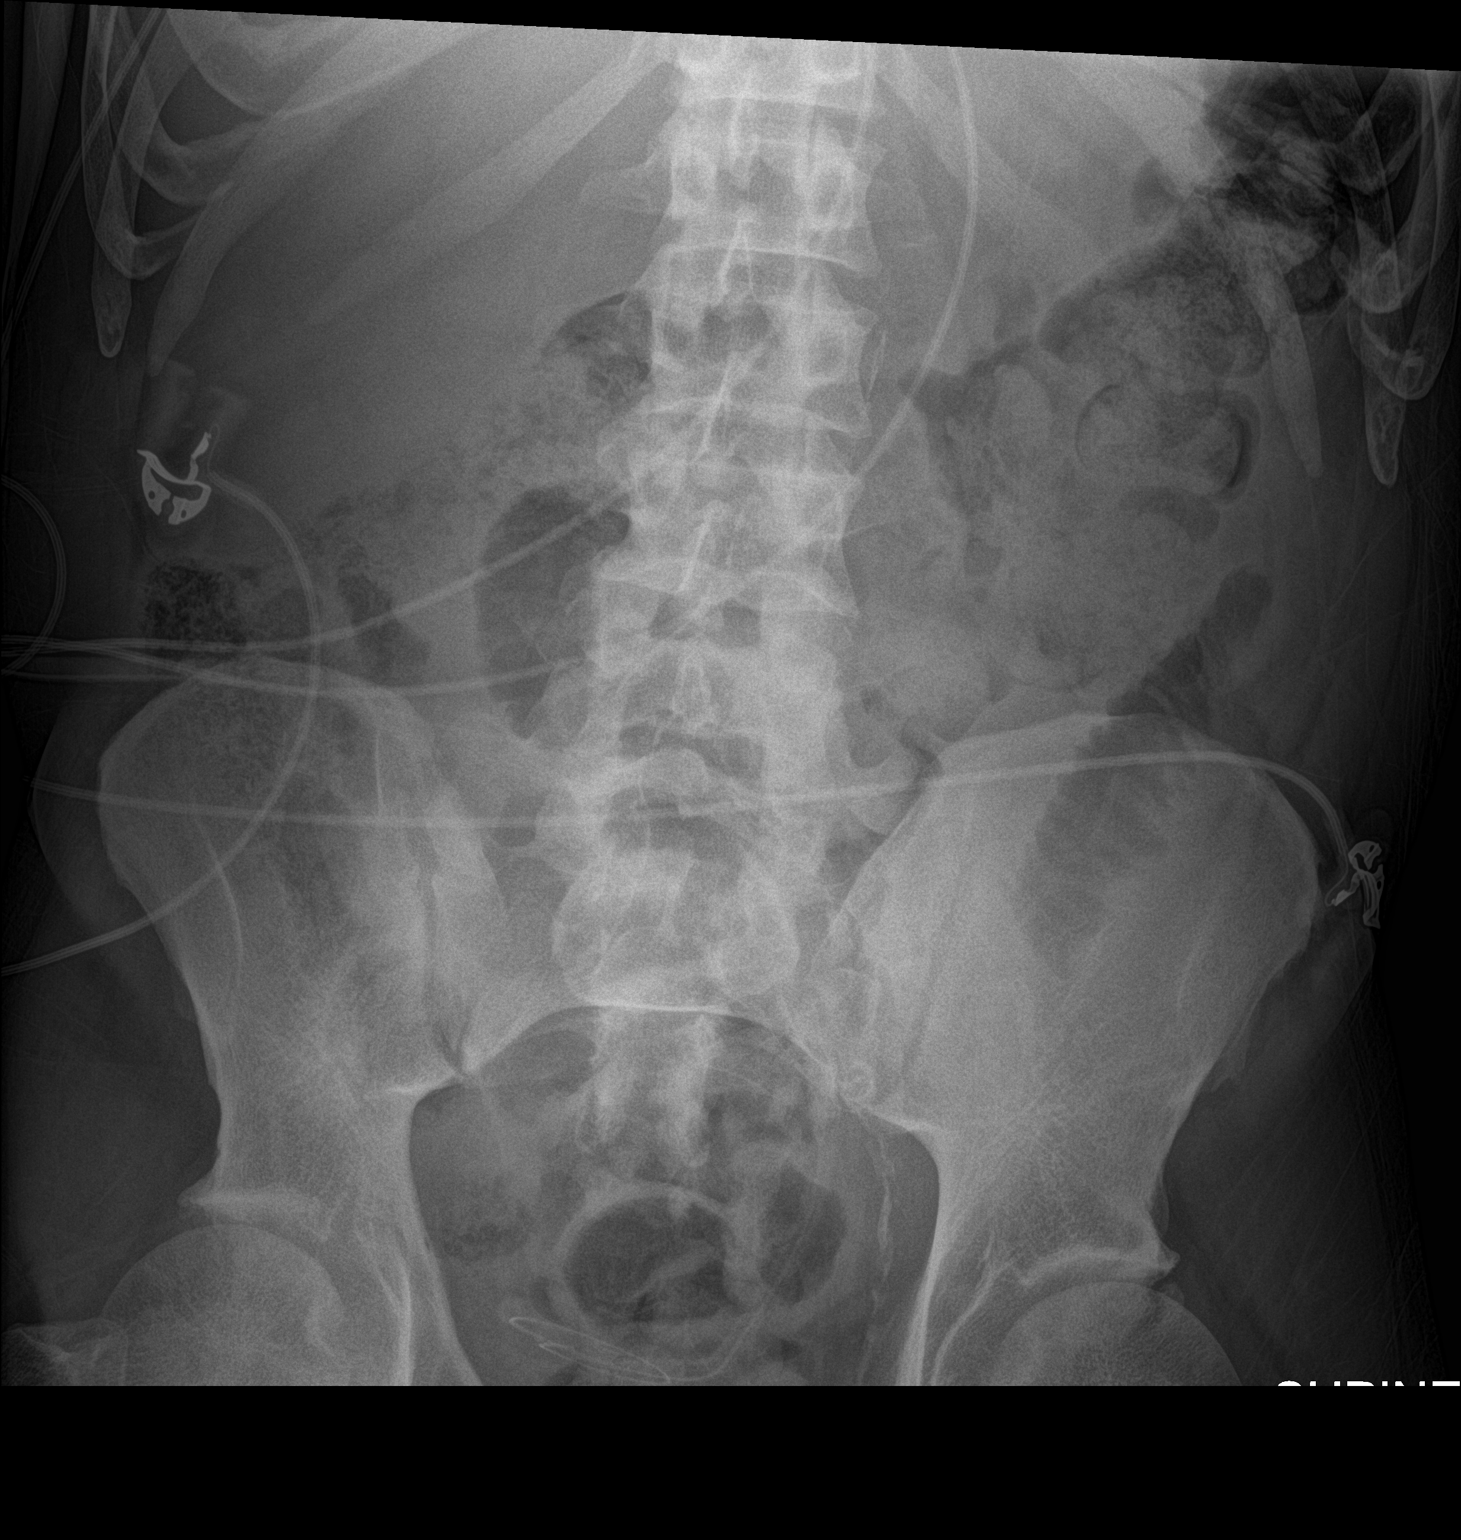

[2 of 2 positions shown; findings below may reference images not displayed]

FINDINGS: The visualized bowel gas pattern is unremarkable. Scattered air and
stool filled loops of colon are seen; no abnormal dilatation of
small bowel loops is seen to suggest small bowel obstruction. No
free intra-abdominal air is identified, though evaluation for free
air is limited on a single supine view.

The visualized osseous structures are within normal limits; the
sacroiliac joints are unremarkable in appearance. A dialysis
catheter is noted overlying the pelvis. The visualized lung bases
are essentially clear.
IMPRESSION: Unremarkable bowel gas pattern; no free intra-abdominal air seen.
Moderate amount of stool noted in the colon.

## 2017-07-03 ENCOUNTER — Other Ambulatory Visit: Payer: Self-pay

## 2017-07-03 ENCOUNTER — Observation Stay (HOSPITAL_COMMUNITY)
Admission: EM | Admit: 2017-07-03 | Discharge: 2017-07-04 | Disposition: A | Discharge status: Home / Self Care | Payer: Medicare Other | Attending: Family Medicine | Admitting: Family Medicine

## 2017-07-03 ENCOUNTER — Emergency Department (HOSPITAL_COMMUNITY): Payer: Medicare Other

## 2017-07-03 ENCOUNTER — Encounter (HOSPITAL_COMMUNITY): Payer: Self-pay | Admitting: Emergency Medicine

## 2017-07-03 DIAGNOSIS — N186 End stage renal disease: Secondary | ICD-10-CM | POA: Diagnosis not present

## 2017-07-03 DIAGNOSIS — F4323 Adjustment disorder with mixed anxiety and depressed mood: Secondary | ICD-10-CM | POA: Diagnosis not present

## 2017-07-03 DIAGNOSIS — E875 Hyperkalemia: Secondary | ICD-10-CM | POA: Diagnosis present

## 2017-07-03 DIAGNOSIS — I313 Pericardial effusion (noninflammatory): Secondary | ICD-10-CM | POA: Insufficient documentation

## 2017-07-03 DIAGNOSIS — I5042 Chronic combined systolic (congestive) and diastolic (congestive) heart failure: Secondary | ICD-10-CM | POA: Diagnosis not present

## 2017-07-03 DIAGNOSIS — G8929 Other chronic pain: Secondary | ICD-10-CM | POA: Diagnosis not present

## 2017-07-03 DIAGNOSIS — D631 Anemia in chronic kidney disease: Secondary | ICD-10-CM | POA: Diagnosis not present

## 2017-07-03 DIAGNOSIS — J811 Chronic pulmonary edema: Secondary | ICD-10-CM | POA: Insufficient documentation

## 2017-07-03 DIAGNOSIS — Z992 Dependence on renal dialysis: Secondary | ICD-10-CM | POA: Diagnosis not present

## 2017-07-03 DIAGNOSIS — K5900 Constipation, unspecified: Secondary | ICD-10-CM | POA: Diagnosis not present

## 2017-07-03 DIAGNOSIS — R100 Acute abdomen: Secondary | ICD-10-CM | POA: Diagnosis not present

## 2017-07-03 DIAGNOSIS — M545 Low back pain: Secondary | ICD-10-CM | POA: Insufficient documentation

## 2017-07-03 DIAGNOSIS — Z7901 Long term (current) use of anticoagulants: Secondary | ICD-10-CM | POA: Insufficient documentation

## 2017-07-03 DIAGNOSIS — Z87891 Personal history of nicotine dependence: Secondary | ICD-10-CM | POA: Insufficient documentation

## 2017-07-03 DIAGNOSIS — K219 Gastro-esophageal reflux disease without esophagitis: Secondary | ICD-10-CM | POA: Insufficient documentation

## 2017-07-03 DIAGNOSIS — I4581 Long QT syndrome: Secondary | ICD-10-CM | POA: Insufficient documentation

## 2017-07-03 DIAGNOSIS — R079 Chest pain, unspecified: Secondary | ICD-10-CM | POA: Insufficient documentation

## 2017-07-03 DIAGNOSIS — Z940 Kidney transplant status: Secondary | ICD-10-CM | POA: Insufficient documentation

## 2017-07-03 DIAGNOSIS — Z86718 Personal history of other venous thrombosis and embolism: Secondary | ICD-10-CM | POA: Insufficient documentation

## 2017-07-03 DIAGNOSIS — E1122 Type 2 diabetes mellitus with diabetic chronic kidney disease: Principal | ICD-10-CM | POA: Insufficient documentation

## 2017-07-03 DIAGNOSIS — Z79899 Other long term (current) drug therapy: Secondary | ICD-10-CM | POA: Insufficient documentation

## 2017-07-03 DIAGNOSIS — E785 Hyperlipidemia, unspecified: Secondary | ICD-10-CM | POA: Insufficient documentation

## 2017-07-03 DIAGNOSIS — G4739 Other sleep apnea: Secondary | ICD-10-CM | POA: Diagnosis not present

## 2017-07-03 DIAGNOSIS — N281 Cyst of kidney, acquired: Secondary | ICD-10-CM | POA: Diagnosis not present

## 2017-07-03 DIAGNOSIS — I428 Other cardiomyopathies: Secondary | ICD-10-CM | POA: Diagnosis not present

## 2017-07-03 DIAGNOSIS — I2510 Atherosclerotic heart disease of native coronary artery without angina pectoris: Secondary | ICD-10-CM | POA: Insufficient documentation

## 2017-07-03 DIAGNOSIS — I10 Essential (primary) hypertension: Secondary | ICD-10-CM

## 2017-07-03 DIAGNOSIS — I700 Atherosclerosis of aorta: Secondary | ICD-10-CM | POA: Insufficient documentation

## 2017-07-03 DIAGNOSIS — M25552 Pain in left hip: Secondary | ICD-10-CM | POA: Diagnosis not present

## 2017-07-03 DIAGNOSIS — I132 Hypertensive heart and chronic kidney disease with heart failure and with stage 5 chronic kidney disease, or end stage renal disease: Secondary | ICD-10-CM | POA: Diagnosis not present

## 2017-07-03 DIAGNOSIS — Z888 Allergy status to other drugs, medicaments and biological substances status: Secondary | ICD-10-CM | POA: Insufficient documentation

## 2017-07-03 DIAGNOSIS — F1210 Cannabis abuse, uncomplicated: Secondary | ICD-10-CM | POA: Insufficient documentation

## 2017-07-03 DIAGNOSIS — Z8249 Family history of ischemic heart disease and other diseases of the circulatory system: Secondary | ICD-10-CM | POA: Insufficient documentation

## 2017-07-03 DIAGNOSIS — Z886 Allergy status to analgesic agent status: Secondary | ICD-10-CM | POA: Insufficient documentation

## 2017-07-03 DIAGNOSIS — S79912A Unspecified injury of left hip, initial encounter: Secondary | ICD-10-CM | POA: Insufficient documentation

## 2017-07-03 DIAGNOSIS — R1012 Left upper quadrant pain: Secondary | ICD-10-CM

## 2017-07-03 LAB — CBC WITH DIFFERENTIAL/PLATELET
Basophils Absolute: 0 10*3/uL (ref 0.0–0.1)
Basophils Relative: 0 %
EOS PCT: 7 %
Eosinophils Absolute: 0.3 10*3/uL (ref 0.0–0.7)
HCT: 31.5 % — ABNORMAL LOW (ref 39.0–52.0)
HEMOGLOBIN: 10.3 g/dL — AB (ref 13.0–17.0)
LYMPHS ABS: 0.9 10*3/uL (ref 0.7–4.0)
LYMPHS PCT: 20 %
MCH: 28.8 pg (ref 26.0–34.0)
MCHC: 32.7 g/dL (ref 30.0–36.0)
MCV: 88 fL (ref 78.0–100.0)
MONO ABS: 0.3 10*3/uL (ref 0.1–1.0)
Monocytes Relative: 6 %
NEUTROS ABS: 3 10*3/uL (ref 1.7–7.7)
Neutrophils Relative %: 67 %
Platelets: 185 10*3/uL (ref 150–400)
RBC: 3.58 MIL/uL — ABNORMAL LOW (ref 4.22–5.81)
RDW: 17.1 % — ABNORMAL HIGH (ref 11.5–15.5)
WBC: 4.5 10*3/uL (ref 4.0–10.5)

## 2017-07-03 LAB — LIPASE, BLOOD: LIPASE: 68 U/L — AB (ref 11–51)

## 2017-07-03 LAB — PROTIME-INR
INR: 1.19
PROTHROMBIN TIME: 15 s (ref 11.4–15.2)

## 2017-07-03 LAB — BASIC METABOLIC PANEL
ANION GAP: 16 — AB (ref 5–15)
ANION GAP: 22 — AB (ref 5–15)
ANION GAP: 18 — AB (ref 5–15)
ANION GAP: 16 — AB (ref 5–15)
BUN: 113 mg/dL — AB (ref 6–20)
BUN: 116 mg/dL — ABNORMAL HIGH (ref 6–20)
BUN: 120 mg/dL — ABNORMAL HIGH (ref 6–20)
BUN: 119 mg/dL — ABNORMAL HIGH (ref 6–20)
CALCIUM: 8.9 mg/dL (ref 8.9–10.3)
CALCIUM: 8.2 mg/dL — AB (ref 8.9–10.3)
CHLORIDE: 100 mmol/L — AB (ref 101–111)
CO2: 24 mmol/L (ref 22–32)
CO2: 18 mmol/L — ABNORMAL LOW (ref 22–32)
CO2: 21 mmol/L — ABNORMAL LOW (ref 22–32)
CO2: 24 mmol/L (ref 22–32)
Calcium: 8.7 mg/dL — ABNORMAL LOW (ref 8.9–10.3)
Calcium: 8.7 mg/dL — ABNORMAL LOW (ref 8.9–10.3)
Chloride: 99 mmol/L — ABNORMAL LOW (ref 101–111)
Chloride: 99 mmol/L — ABNORMAL LOW (ref 101–111)
Chloride: 96 mmol/L — ABNORMAL LOW (ref 101–111)
Creatinine, Ser: 14.85 mg/dL — ABNORMAL HIGH (ref 0.61–1.24)
Creatinine, Ser: 15.84 mg/dL — ABNORMAL HIGH (ref 0.61–1.24)
Creatinine, Ser: 16.07 mg/dL — ABNORMAL HIGH (ref 0.61–1.24)
Creatinine, Ser: 16.24 mg/dL — ABNORMAL HIGH (ref 0.61–1.24)
GFR calc Af Amer: 4 mL/min — ABNORMAL LOW (ref >60)
GFR calc non Af Amer: 3 mL/min — ABNORMAL LOW (ref >60)
GFR, EST AFRICAN AMERICAN: 3 mL/min — AB (ref >60)
GFR, EST AFRICAN AMERICAN: 3 mL/min — AB (ref >60)
GFR, EST AFRICAN AMERICAN: 3 mL/min — AB (ref >60)
GFR, EST NON AFRICAN AMERICAN: 3 mL/min — AB (ref >60)
GFR, EST NON AFRICAN AMERICAN: 3 mL/min — AB (ref >60)
GFR, EST NON AFRICAN AMERICAN: 3 mL/min — AB (ref >60)
GLUCOSE: 97 mg/dL (ref 65–99)
GLUCOSE: 83 mg/dL (ref 65–99)
GLUCOSE: 106 mg/dL — AB (ref 65–99)
Glucose, Bld: 110 mg/dL — ABNORMAL HIGH (ref 65–99)
POTASSIUM: 6.2 mmol/L — AB (ref 3.5–5.1)
Potassium: 6.1 mmol/L — ABNORMAL HIGH (ref 3.5–5.1)
Potassium: 6.5 mmol/L (ref 3.5–5.1)
Potassium: 5.5 mmol/L — ABNORMAL HIGH (ref 3.5–5.1)
SODIUM: 139 mmol/L (ref 135–145)
SODIUM: 138 mmol/L (ref 135–145)
Sodium: 140 mmol/L (ref 135–145)
Sodium: 136 mmol/L (ref 135–145)

## 2017-07-03 LAB — HEPATIC FUNCTION PANEL
ALT: 22 U/L (ref 17–63)
AST: 38 U/L (ref 15–41)
Albumin: 3.5 g/dL (ref 3.5–5.0)
Alkaline Phosphatase: 126 U/L (ref 38–126)
BILIRUBIN DIRECT: 0.1 mg/dL (ref 0.1–0.5)
BILIRUBIN INDIRECT: 0.6 mg/dL (ref 0.3–0.9)
Total Bilirubin: 0.7 mg/dL (ref 0.3–1.2)
Total Protein: 8.5 g/dL — ABNORMAL HIGH (ref 6.5–8.1)

## 2017-07-03 LAB — PHOSPHORUS: Phosphorus: 6.7 mg/dL — ABNORMAL HIGH (ref 2.5–4.6)

## 2017-07-03 LAB — MAGNESIUM: MAGNESIUM: 2.3 mg/dL (ref 1.7–2.4)

## 2017-07-03 IMAGING — CT CT ABD-PELV W/O CM
2 of 4 series · 11 of 46 positions shown, 12 images · non-contrast
Comparison: CT abdomen and pelvis 11/05/2014.

CLINICAL DATA: Acute onset right groin pain 1 day ago. Patient
reports a right groin lesion. Initial encounter.

EXAM:
CT ABDOMEN AND PELVIS WITHOUT CONTRAST
TECHNIQUE: Multidetector CT imaging of the abdomen and pelvis was performed
following the standard protocol without IV contrast.

[Series 201: routine, idose (2) · axial · 0.70mm/px · z∈[+100,+505]mm · 8 of 97 slices shown, 9 images]
[im 8/97  soft-tissue]
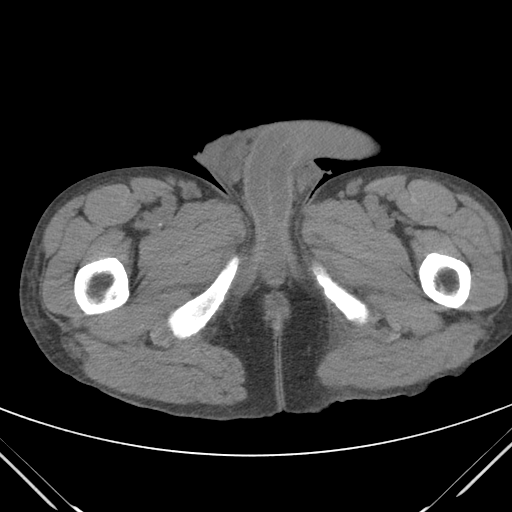
[im 8/97  bone]
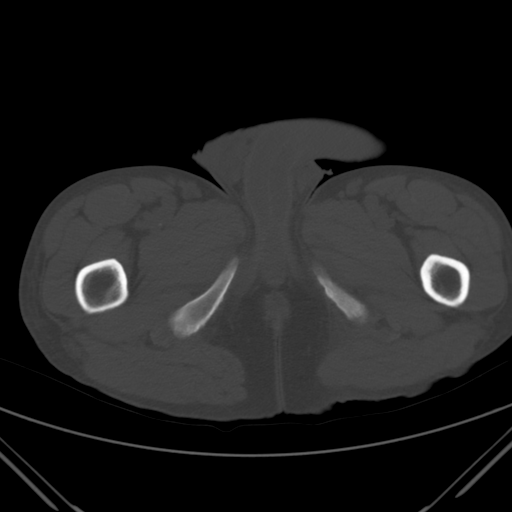
[im 18/97  soft-tissue]
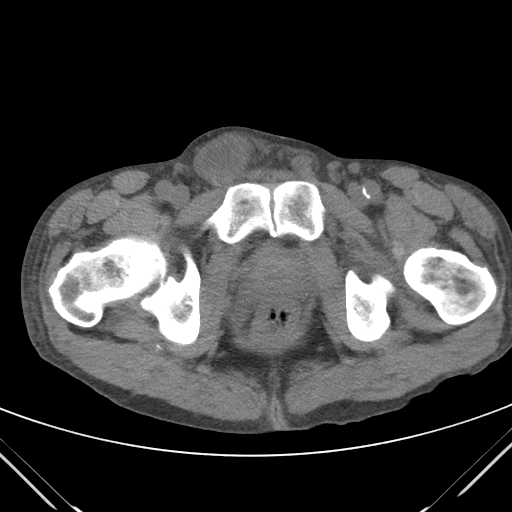
[im 33/97  soft-tissue]
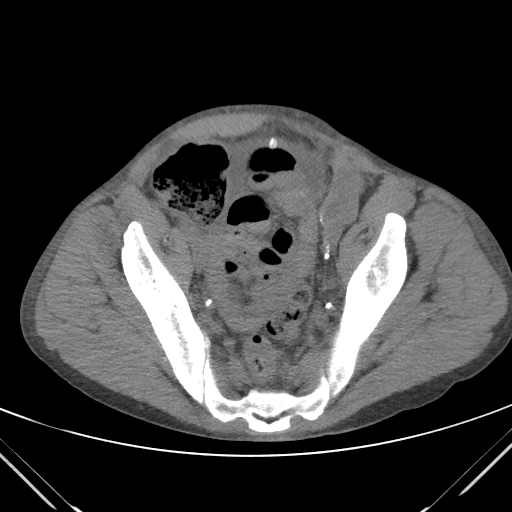
[im 43/97  soft-tissue]
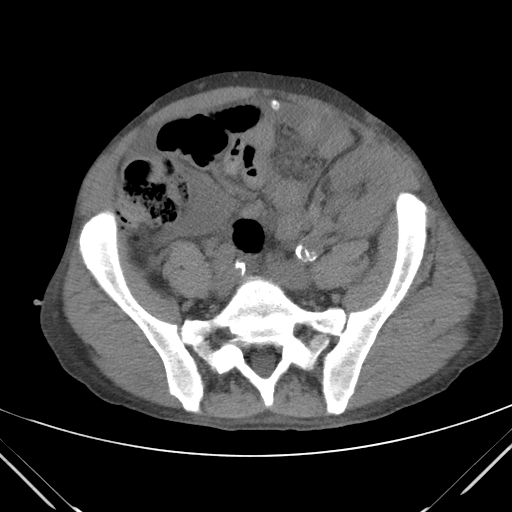
[im 54/97  soft-tissue]
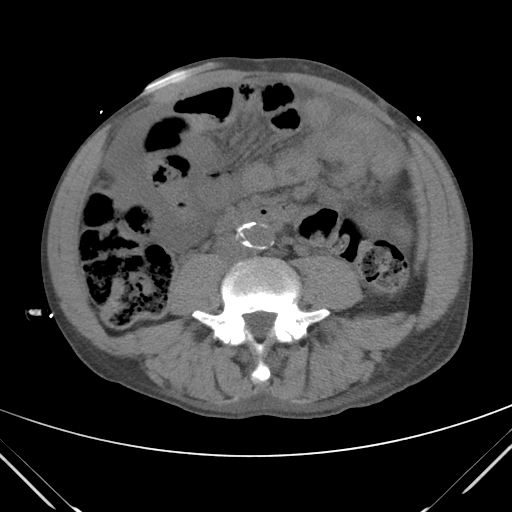
[im 65/97  soft-tissue]
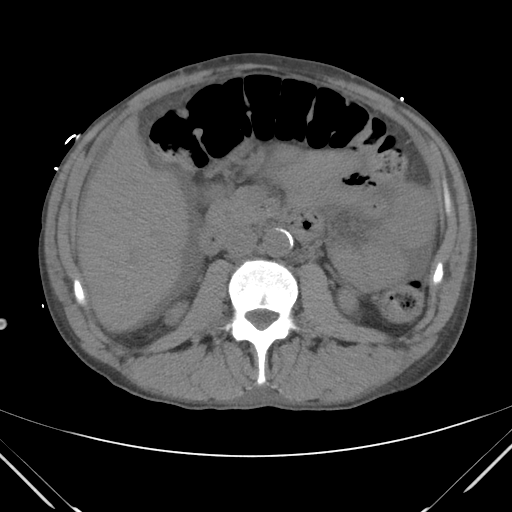
[im 79/97  soft-tissue]
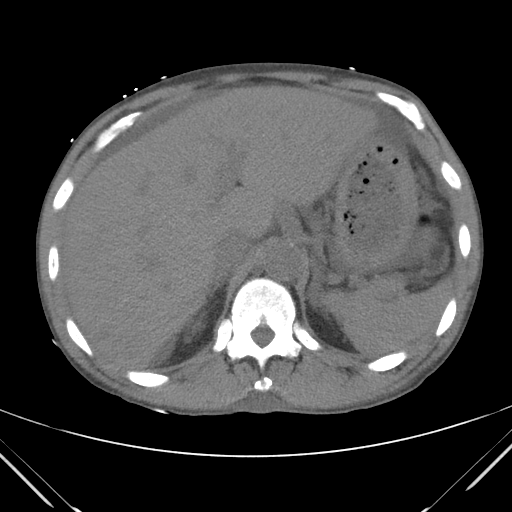
[im 89/97  soft-tissue]
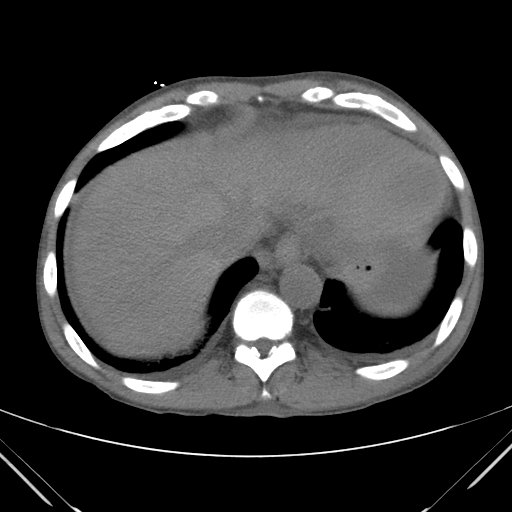

[Series 203: coronals, idose (2) · coronal · 0.45mm/px · 3 of 103 slices shown]
[im 35/103  soft-tissue]
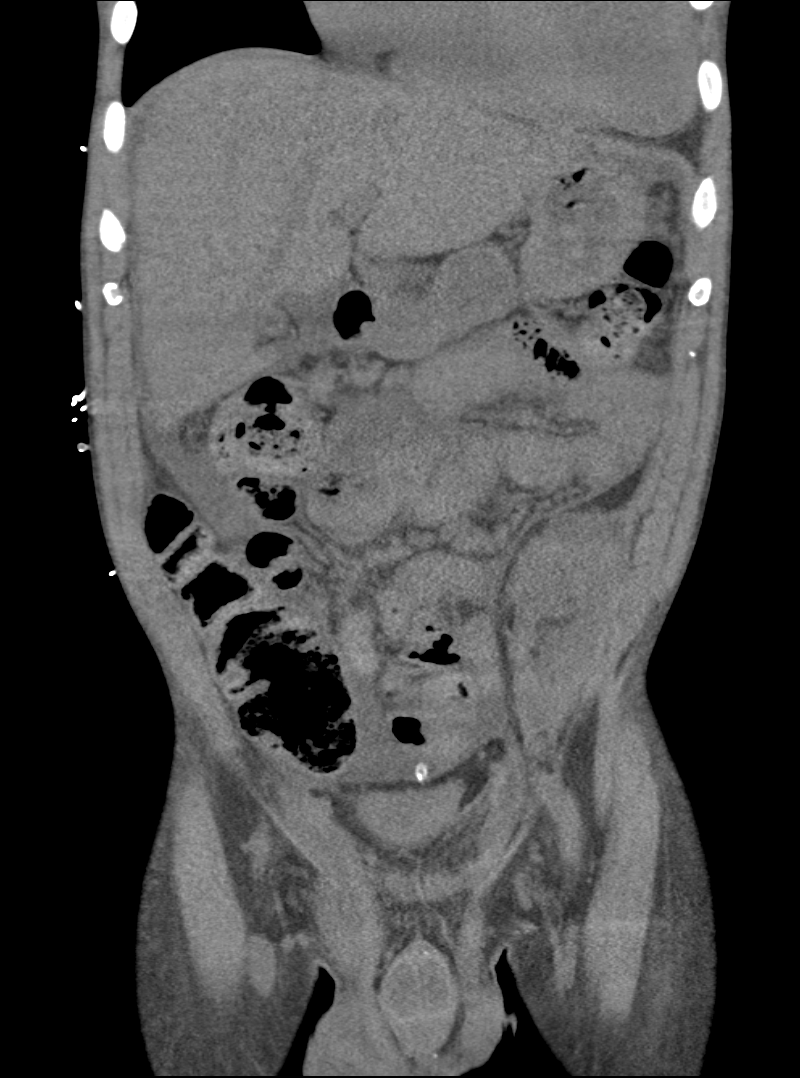
[im 46/103  soft-tissue]
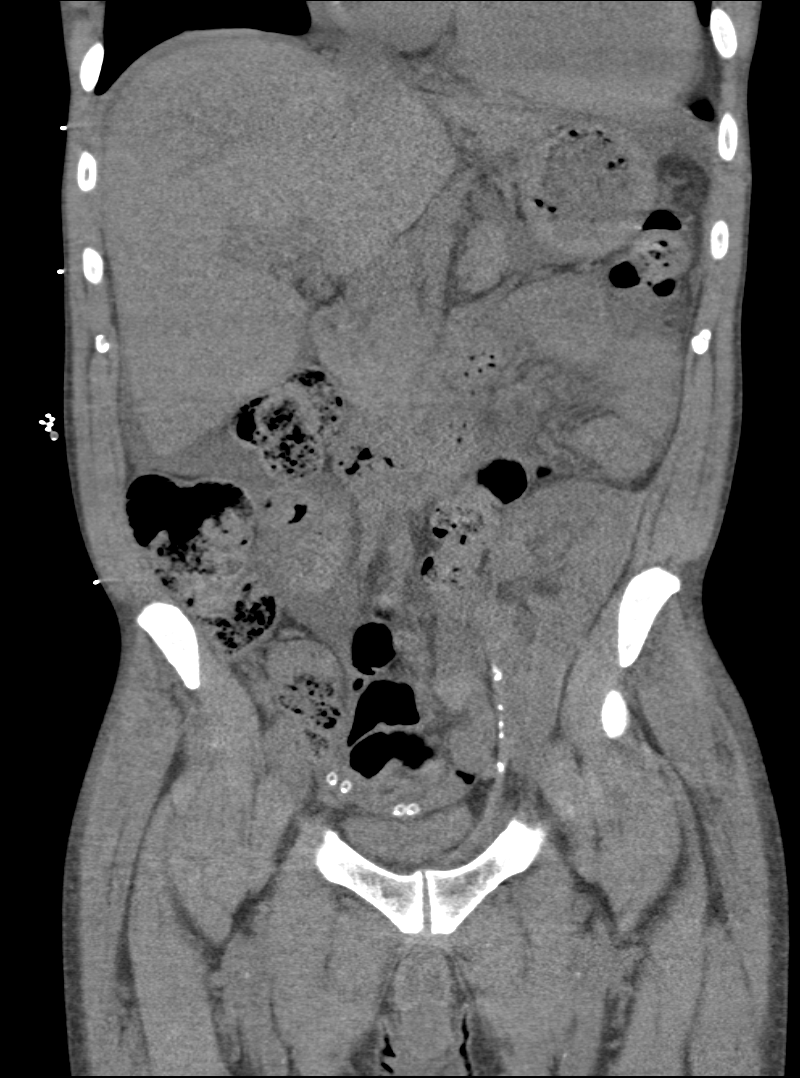
[im 57/103  soft-tissue]
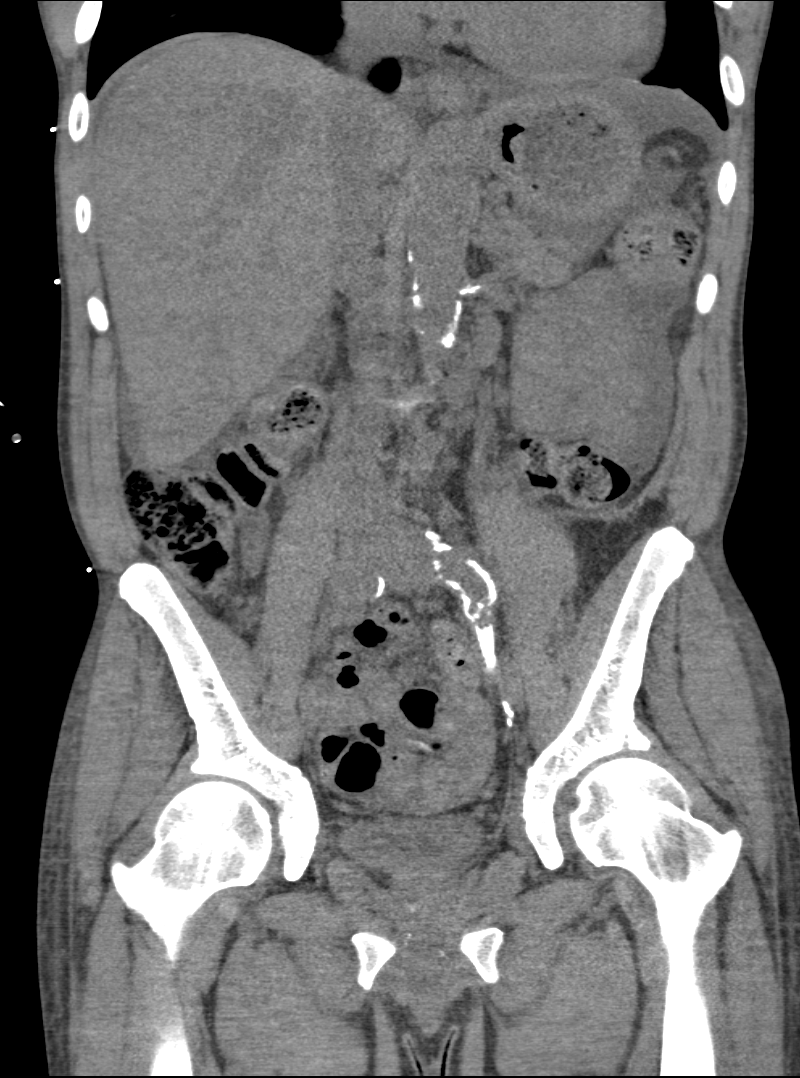

[11 of 46 positions shown; findings below may reference images not displayed]

FINDINGS: The patient has small bilateral pleural effusions which are new
since the prior exam. Mild dependent atelectasis is identified.
There is cardiomegaly and small pericardial effusion, unchanged.

There is a small amount of abdominal and pelvic free-fluid. Catheter
for peritoneal dialysis is identified. The patient has a right
inguinal hernia containing free fluid. Its appearance is unchanged.

The kidneys are markedly atrophic consistent with end-stage renal
disease. Renal transplant in the left lower quadrant is unchanged in
appearance. There is extensive aortoiliac atherosclerosis without
aneurysm. The spleen, pancreas, liver, gallbladder and adrenal
glands are unremarkable. No lymphadenopathy is seen. No bony
abnormality is identified.
IMPRESSION: No acute finding.

Right inguinal hernia containing free fluid is unchanged. Note is
made that the patient is on peritoneal dialysis accounting for small
volume of fluid in the abdomen and pelvis.

Small bilateral pleural effusions, new since the prior study.

No change in cardiomegaly and a small pericardial effusion.

## 2017-07-03 MED ORDER — SODIUM POLYSTYRENE SULFONATE 15 GM/60ML PO SUSP
45.0000 g | Freq: Once | ORAL | Status: AC
  Administered 2017-07-03: 45 g via ORAL
  Filled 2017-07-03: qty 180

## 2017-07-03 MED ORDER — IBUPROFEN 400 MG PO TABS: 400.0000 mg | ORAL_TABLET | Freq: Once | ORAL | Status: DC

## 2017-07-03 MED ORDER — AMITRIPTYLINE HCL 10 MG PO TABS
10.0000 mg | ORAL_TABLET | Freq: Every day | ORAL | Status: DC
  Administered 2017-07-03: 10 mg via ORAL
  Filled 2017-07-03: qty 1

## 2017-07-03 MED ORDER — CARVEDILOL 25 MG PO TABS
25.0000 mg | ORAL_TABLET | Freq: Two times a day (BID) | ORAL | Status: DC
  Administered 2017-07-03 – 2017-07-04 (×2): 25 mg via ORAL
  Filled 2017-07-03 (×2): qty 1

## 2017-07-03 MED ORDER — AMLODIPINE BESYLATE 10 MG PO TABS
10.0000 mg | ORAL_TABLET | Freq: Every day | ORAL | Status: DC
  Administered 2017-07-03: 10 mg via ORAL
  Filled 2017-07-03: qty 1

## 2017-07-03 MED ORDER — CLONIDINE HCL 0.3 MG/24HR TD PTWK
0.3000 mg | MEDICATED_PATCH | TRANSDERMAL | Status: DC
Start: 2017-07-09 — End: 2017-07-03
  Filled 2017-07-03: qty 1

## 2017-07-03 MED ORDER — SODIUM CHLORIDE 0.9 % IV SOLN
1.0000 g | Freq: Once | INTRAVENOUS | Status: AC
  Administered 2017-07-03: 1 g via INTRAVENOUS
  Filled 2017-07-03: qty 10

## 2017-07-03 MED ORDER — ONDANSETRON 4 MG PO TBDP
4.0000 mg | ORAL_TABLET | Freq: Once | ORAL | Status: AC
  Administered 2017-07-03: 4 mg via ORAL
  Filled 2017-07-03: qty 1

## 2017-07-03 MED ORDER — PANTOPRAZOLE SODIUM 40 MG PO TBEC
40.0000 mg | DELAYED_RELEASE_TABLET | Freq: Two times a day (BID) | ORAL | Status: DC
  Administered 2017-07-03 – 2017-07-04 (×2): 40 mg via ORAL
  Filled 2017-07-03 (×2): qty 1

## 2017-07-03 MED ORDER — TRAMADOL HCL 50 MG PO TABS: 50.0000 mg | ORAL_TABLET | Freq: Two times a day (BID) | ORAL | Status: DC | PRN

## 2017-07-03 MED ORDER — WARFARIN SODIUM 5 MG PO TABS
5.0000 mg | ORAL_TABLET | Freq: Every day | ORAL | Status: DC
  Administered 2017-07-03: 5 mg via ORAL
  Filled 2017-07-03: qty 1

## 2017-07-03 MED ORDER — TRAMADOL HCL 50 MG PO TABS
50.0000 mg | ORAL_TABLET | Freq: Two times a day (BID) | ORAL | Status: DC | PRN
  Administered 2017-07-03: 50 mg via ORAL
  Filled 2017-07-03: qty 1

## 2017-07-03 MED ORDER — TRAMADOL HCL 50 MG PO TABS
50.0000 mg | ORAL_TABLET | Freq: Once | ORAL | Status: AC
  Administered 2017-07-03: 50 mg via ORAL
  Filled 2017-07-03: qty 1

## 2017-07-03 MED ORDER — GABAPENTIN 100 MG PO CAPS
100.0000 mg | ORAL_CAPSULE | Freq: Every day | ORAL | Status: DC
  Administered 2017-07-03: 100 mg via ORAL
  Filled 2017-07-03: qty 1

## 2017-07-03 MED ORDER — CLONIDINE HCL 0.3 MG/24HR TD PTWK
0.3000 mg | MEDICATED_PATCH | TRANSDERMAL | Status: DC
  Administered 2017-07-03: 0.3 mg via TRANSDERMAL
  Filled 2017-07-03: qty 1

## 2017-07-03 MED ORDER — LABETALOL HCL 5 MG/ML IV SOLN
10.0000 mg | INTRAVENOUS | Status: DC | PRN
  Administered 2017-07-04: 10 mg via INTRAVENOUS
  Filled 2017-07-03 (×2): qty 4

## 2017-07-03 MED ORDER — SEVELAMER CARBONATE 800 MG PO TABS
2400.0000 mg | ORAL_TABLET | Freq: Three times a day (TID) | ORAL | Status: DC
  Administered 2017-07-03 – 2017-07-04 (×2): 2400 mg via ORAL
  Filled 2017-07-03 (×2): qty 3

## 2017-07-03 MED ORDER — ISOSORBIDE MONONITRATE ER 60 MG PO TB24
60.0000 mg | ORAL_TABLET | Freq: Every day | ORAL | Status: DC
  Administered 2017-07-03 – 2017-07-04 (×2): 60 mg via ORAL
  Filled 2017-07-03 (×2): qty 1

## 2017-07-03 MED ORDER — WARFARIN - PHYSICIAN DOSING INPATIENT: Freq: Every day | Status: DC

## 2017-07-03 MED ORDER — OXYCODONE HCL 5 MG PO TABS
5.0000 mg | ORAL_TABLET | ORAL | Status: DC | PRN
  Administered 2017-07-03 – 2017-07-04 (×3): 5 mg via ORAL
  Filled 2017-07-03 (×3): qty 1

## 2017-07-03 MED ORDER — METOCLOPRAMIDE HCL 5 MG PO TABS: 5.0000 mg | ORAL_TABLET | Freq: Three times a day (TID) | ORAL | Status: DC | PRN

## 2017-07-03 MED ORDER — PROMETHAZINE HCL 25 MG PO TABS: 25.0000 mg | ORAL_TABLET | Freq: Four times a day (QID) | ORAL | Status: DC | PRN

## 2017-07-03 MED ORDER — HYDRALAZINE HCL 50 MG PO TABS
100.0000 mg | ORAL_TABLET | Freq: Two times a day (BID) | ORAL | Status: DC
  Administered 2017-07-03: 90 mg via ORAL
  Administered 2017-07-03: 10 mg via ORAL
  Administered 2017-07-03 – 2017-07-04 (×2): 100 mg via ORAL
  Filled 2017-07-03: qty 2
  Filled 2017-07-03 (×2): qty 10
  Filled 2017-07-03: qty 2

## 2017-07-03 MED ORDER — DOXAZOSIN MESYLATE 4 MG PO TABS
4.0000 mg | ORAL_TABLET | Freq: Every day | ORAL | Status: DC
  Administered 2017-07-03: 4 mg via ORAL
  Filled 2017-07-03: qty 1

## 2017-07-03 NOTE — ED Notes (Signed)
Pt not in room when rounding performed.
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
## 2017-07-03 NOTE — Progress Notes (Addendum)
Patient has arrangements made at the [facility] for two weeks as a transient status.  He is to be there tomorrow to sign papers and start on a MWF -2 shift.   Repeat K today drawn at 10 was 6.1 Kayexalate was given at 915 likely didn't have time to work. He was also given IV Ca at 1133. BP is very high.  He needs to resume his home BP medications.  Dialysis orders have been sent to his transient unit and he will be informed of what time to report today.  He cannot be dialyzed here today due to high patient volume and acuity of inpatients.    Have discussed with teaching service.  He can be d/c in the am to attend HD as above.  Amalia Hailey, PA-C [facility]

## 2017-07-03 NOTE — Progress Notes (Signed)
Arrived to 6n15 from ED at this time.  Oriented to room and surroundings

## 2017-07-03 NOTE — Progress Notes (Signed)
Dr. Tammi Klippel notified of K 6.5.[name]

## 2017-07-03 NOTE — ED Notes (Signed)
Pt continues to remove vital signs equipment. Pt states that it "hinders" him too much.

## 2017-07-03 NOTE — ED Notes (Signed)
Pt sitting on chair eating breakfast. Pt states he will take his kayexlate when he is finished. Medication previously placed on his meal tray. Discusses with patient treatment plan and patient acknowledges that he cannot get dialyzed in [place] because they "will not accept him". Pt states he is looking for treatment here today.
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
## 2017-07-03 NOTE — ED Notes (Signed)
Renal w/ a 1200 ML Fluid Rest diet lunch tray ordered @ 0945.

## 2017-07-03 NOTE — ED Notes (Signed)
Pt found walking hallway and instructed to remain in room for safety reasons.

## 2017-07-03 NOTE — ED Notes (Signed)
Pt had taken  Cardiac leads on, lead replaced by this RN and instructed pt to leave them on. Pt requesting pain medication and offered ibuprofen but declined.

## 2017-07-03 NOTE — ED Notes (Signed)
Pt had stopped the pump and removed the IV line from the bag to the IV site because it "beeped too much". The IV bag was empty however there was fluid on the floor. Pt stated he did not know where the fluid came from. Pt instructed to call the RN next time he needed something.

## 2017-07-03 NOTE — ED Triage Notes (Signed)
Per EMS pt missed dialysis Wednesday. Complaints of chest pain, epigastric. No SHOB.  No other complaints at this time.

## 2017-07-03 NOTE — ED Notes (Signed)
Pt refused to take kayexalate until he eats breakfast

## 2017-07-04 DIAGNOSIS — I5042 Chronic combined systolic (congestive) and diastolic (congestive) heart failure: Secondary | ICD-10-CM | POA: Diagnosis not present

## 2017-07-04 DIAGNOSIS — R079 Chest pain, unspecified: Secondary | ICD-10-CM | POA: Diagnosis not present

## 2017-07-04 DIAGNOSIS — F4323 Adjustment disorder with mixed anxiety and depressed mood: Secondary | ICD-10-CM | POA: Diagnosis not present

## 2017-07-04 DIAGNOSIS — N186 End stage renal disease: Secondary | ICD-10-CM | POA: Diagnosis not present

## 2017-07-04 DIAGNOSIS — E875 Hyperkalemia: Secondary | ICD-10-CM | POA: Diagnosis not present

## 2017-07-04 DIAGNOSIS — N2581 Secondary hyperparathyroidism of renal origin: Secondary | ICD-10-CM | POA: Diagnosis not present

## 2017-07-04 DIAGNOSIS — E1122 Type 2 diabetes mellitus with diabetic chronic kidney disease: Secondary | ICD-10-CM | POA: Diagnosis not present

## 2017-07-04 DIAGNOSIS — I10 Essential (primary) hypertension: Secondary | ICD-10-CM | POA: Diagnosis not present

## 2017-07-04 DIAGNOSIS — G8929 Other chronic pain: Secondary | ICD-10-CM | POA: Diagnosis not present

## 2017-07-04 DIAGNOSIS — I132 Hypertensive heart and chronic kidney disease with heart failure and with stage 5 chronic kidney disease, or end stage renal disease: Secondary | ICD-10-CM | POA: Diagnosis not present

## 2017-07-04 DIAGNOSIS — M25552 Pain in left hip: Secondary | ICD-10-CM | POA: Diagnosis not present

## 2017-07-04 DIAGNOSIS — Z992 Dependence on renal dialysis: Secondary | ICD-10-CM | POA: Diagnosis not present

## 2017-07-04 DIAGNOSIS — Z86718 Personal history of other venous thrombosis and embolism: Secondary | ICD-10-CM | POA: Diagnosis not present

## 2017-07-04 DIAGNOSIS — D631 Anemia in chronic kidney disease: Secondary | ICD-10-CM | POA: Diagnosis not present

## 2017-07-04 LAB — PROTIME-INR
INR: 1.3
PROTHROMBIN TIME: 16.1 s — AB (ref 11.4–15.2)

## 2017-07-04 MED ORDER — METOCLOPRAMIDE HCL 5 MG PO TABS: 5.0000 mg | ORAL_TABLET | Freq: Three times a day (TID) | ORAL | 0 refills | Status: DC | PRN

## 2017-07-04 MED ORDER — RAMELTEON 8 MG PO TABS
8.0000 mg | ORAL_TABLET | Freq: Every evening | ORAL | Status: DC | PRN
  Administered 2017-07-04: 8 mg via ORAL
  Filled 2017-07-04 (×2): qty 1

## 2017-07-04 MED ORDER — PROMETHAZINE HCL 25 MG PO TABS: 25.0000 mg | ORAL_TABLET | Freq: Four times a day (QID) | ORAL | 0 refills | Status: DC | PRN

## 2017-07-04 MED ORDER — ISOSORBIDE MONONITRATE ER 60 MG PO TB24: 60.0000 mg | ORAL_TABLET | Freq: Every day | ORAL | 0 refills | Status: DC

## 2017-07-04 MED ORDER — AMLODIPINE BESYLATE 10 MG PO TABS: 10.0000 mg | ORAL_TABLET | Freq: Every day | ORAL | 0 refills | Status: DC

## 2017-07-04 MED ORDER — HYDRALAZINE HCL 100 MG PO TABS: 100.0000 mg | ORAL_TABLET | Freq: Two times a day (BID) | ORAL | 0 refills | Status: DC

## 2017-07-04 MED ORDER — TRAMADOL HCL 50 MG PO TABS: 50.0000 mg | ORAL_TABLET | Freq: Two times a day (BID) | ORAL | 0 refills | Status: DC | PRN

## 2017-07-04 MED ORDER — CARVEDILOL 25 MG PO TABS: 25.0000 mg | ORAL_TABLET | Freq: Two times a day (BID) | ORAL | 0 refills | Status: DC

## 2017-07-04 MED ORDER — OXYCODONE HCL 5 MG PO TABS: 5.0000 mg | ORAL_TABLET | ORAL | 0 refills | Status: DC | PRN

## 2017-07-04 MED ORDER — AMITRIPTYLINE HCL 10 MG PO TABS: 10.0000 mg | ORAL_TABLET | Freq: Every day | ORAL | 0 refills | Status: DC

## 2017-07-04 MED ORDER — GABAPENTIN 100 MG PO CAPS: 100.0000 mg | ORAL_CAPSULE | Freq: Every day | ORAL | 0 refills | Status: DC

## 2017-07-04 NOTE — Progress Notes (Signed)
Attending Brief Progress Note  S: Patient seen this AM on rounds. He has been set up for a meeting with his new temporary outpatient dialysis center. He has no physical complaints this AM, just that he wants dialysis. Transportation has been arranged for him to get to HD.  O: BP (!) 173/124 (BP Location: Right Arm)   Pulse 87   Temp (!) 97.3 F (36.3 C) (Oral)   Resp 19   Ht _0  (1.88 m)   Wt 227 lb 11.8 oz (103.3 kg)   SpO2 93%   BMI 29.24 kg/m   Gen: NAD, pleasant, cooperative, well appearing Heart: regular rate and rhythm Lungs: clear to auscultation bilaterally, no crackles, normal work of breathing Abdomen: mildly tender to palpation throughout Extremities: No appreciable lower extremity edema bilaterally   A/P: Stable for discharge out of the hospital to go directly to new HD center to get established.  Will sign resident note when it is available.  Chrisandra Netters, MD [place]
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
## 2017-07-04 NOTE — Plan of Care (Signed)
  Education: Knowledge of General Education information will improve 07/04/2017 0114 - Progressing by Anson Fret, RN Note POC reviewed with pt.

## 2017-07-04 NOTE — Progress Notes (Signed)
Patient agreeable to go to outpatient clinic at [facility] for dialysis today.  Initially refusing because he wanted treatment here. Discussed with him the volume of patients at the hospital is high, with varying acuity and he already has a scheduled treatment so he needed to keep at appointment.   Voucher provided by SW for transportation to center.  Orders sent yesterday.  Repeat K improved to 5.5 post kayexalate. BP elevated, slight improvement with meds.  Weights variable making it hard to determine actual volume present.  Walking around on RA and in NAD.   Discussed patient with Dr. Ardelia Mems. Stable to be discharged to facility for dialysis.  Jen Mow, PA-C [facility]  Pt seen, examined and agree w A/P as above.  Kelly Splinter MD [employer] pager [phone]   07/04/2017, 4:58 PM

## 2017-07-04 NOTE — Progress Notes (Signed)
Has been accepted at [facility] can start to Friday Jul 04 2017 at 11:15 am

## 2017-07-04 NOTE — Progress Notes (Signed)
Set up cpap for pt with a L full faced mask and a pressure of 10. 0L bled in. Pt self administers and educated pt of cpap and advised to call if needed anything.

## 2017-07-04 NOTE — Progress Notes (Addendum)
Pt. wanting sleep med; stating Elavil didn't help; FM paged and Dr. Enid Derry RTC.

## 2017-07-04 NOTE — Progress Notes (Signed)
Tech checked in with the patient asking when they would like to wash up. Patient said he is going to be going home within the hour and he will wash up there. Tech provided what was needed for oral care.

## 2017-07-04 NOTE — Social Work (Signed)
CSW consulted by Resident MD to support pt with transportation needs.   Pt will be receiving dialysis at [facility], [address] and will be going there for an 11:15 appt.   CSW spoke briefly with pt who states that he has no family or friends that are available to support pt to get from hospital to appt at [facility], pt also states he has no money to pay for his own cab. CSW provided RN with cab voucher to call for pt cab when discharged.   CSW signing off. Please consult if any additional needs arise.  Alexander Mt, [place] Work [phone]

## 2017-07-04 NOTE — Discharge Instructions (Signed)
Please continue going to dialysis at [facility], [address], on Monday Wednesday Friday schedule.  I have sent your prescriptions to [facility] for blood pressure, nausea, sleep. There is a printed prescription for pain medications.

## 2017-07-08 DIAGNOSIS — N2581 Secondary hyperparathyroidism of renal origin: Secondary | ICD-10-CM | POA: Diagnosis not present

## 2017-07-08 DIAGNOSIS — N186 End stage renal disease: Secondary | ICD-10-CM | POA: Diagnosis not present

## 2017-07-08 DIAGNOSIS — D631 Anemia in chronic kidney disease: Secondary | ICD-10-CM | POA: Diagnosis not present

## 2017-07-09 DIAGNOSIS — N2581 Secondary hyperparathyroidism of renal origin: Secondary | ICD-10-CM | POA: Diagnosis not present

## 2017-07-09 DIAGNOSIS — D631 Anemia in chronic kidney disease: Secondary | ICD-10-CM | POA: Diagnosis not present

## 2017-07-09 DIAGNOSIS — N186 End stage renal disease: Secondary | ICD-10-CM | POA: Diagnosis not present

## 2017-07-11 DIAGNOSIS — N186 End stage renal disease: Secondary | ICD-10-CM | POA: Diagnosis not present

## 2017-07-11 DIAGNOSIS — N2581 Secondary hyperparathyroidism of renal origin: Secondary | ICD-10-CM | POA: Diagnosis not present

## 2017-07-11 DIAGNOSIS — D631 Anemia in chronic kidney disease: Secondary | ICD-10-CM | POA: Diagnosis not present

## 2017-07-14 DIAGNOSIS — D631 Anemia in chronic kidney disease: Secondary | ICD-10-CM | POA: Diagnosis not present

## 2017-07-14 DIAGNOSIS — N2581 Secondary hyperparathyroidism of renal origin: Secondary | ICD-10-CM | POA: Diagnosis not present

## 2017-07-14 DIAGNOSIS — N186 End stage renal disease: Secondary | ICD-10-CM | POA: Diagnosis not present

## 2017-07-16 DIAGNOSIS — N186 End stage renal disease: Secondary | ICD-10-CM | POA: Diagnosis not present

## 2017-07-16 DIAGNOSIS — N2581 Secondary hyperparathyroidism of renal origin: Secondary | ICD-10-CM | POA: Diagnosis not present

## 2017-07-16 DIAGNOSIS — D631 Anemia in chronic kidney disease: Secondary | ICD-10-CM | POA: Diagnosis not present

## 2017-07-17 ENCOUNTER — Emergency Department (HOSPITAL_COMMUNITY)
Admission: EM | Admit: 2017-07-17 | Discharge: 2017-07-17 | Disposition: A | Discharge status: Home / Self Care | Payer: Medicare Other | Attending: Emergency Medicine | Admitting: Emergency Medicine

## 2017-07-17 ENCOUNTER — Encounter (HOSPITAL_COMMUNITY): Payer: Self-pay | Admitting: Emergency Medicine

## 2017-07-17 DIAGNOSIS — R1013 Epigastric pain: Secondary | ICD-10-CM | POA: Insufficient documentation

## 2017-07-17 DIAGNOSIS — Z79899 Other long term (current) drug therapy: Secondary | ICD-10-CM | POA: Diagnosis not present

## 2017-07-17 DIAGNOSIS — Z992 Dependence on renal dialysis: Secondary | ICD-10-CM | POA: Diagnosis not present

## 2017-07-17 DIAGNOSIS — R112 Nausea with vomiting, unspecified: Secondary | ICD-10-CM | POA: Insufficient documentation

## 2017-07-17 DIAGNOSIS — E1122 Type 2 diabetes mellitus with diabetic chronic kidney disease: Secondary | ICD-10-CM | POA: Diagnosis not present

## 2017-07-17 DIAGNOSIS — N186 End stage renal disease: Secondary | ICD-10-CM | POA: Insufficient documentation

## 2017-07-17 DIAGNOSIS — I132 Hypertensive heart and chronic kidney disease with heart failure and with stage 5 chronic kidney disease, or end stage renal disease: Secondary | ICD-10-CM | POA: Insufficient documentation

## 2017-07-17 DIAGNOSIS — I5042 Chronic combined systolic (congestive) and diastolic (congestive) heart failure: Secondary | ICD-10-CM | POA: Insufficient documentation

## 2017-07-17 DIAGNOSIS — G8929 Other chronic pain: Secondary | ICD-10-CM | POA: Insufficient documentation

## 2017-07-17 DIAGNOSIS — Z7901 Long term (current) use of anticoagulants: Secondary | ICD-10-CM | POA: Insufficient documentation

## 2017-07-17 DIAGNOSIS — Z87891 Personal history of nicotine dependence: Secondary | ICD-10-CM | POA: Diagnosis not present

## 2017-07-17 DIAGNOSIS — R109 Unspecified abdominal pain: Secondary | ICD-10-CM

## 2017-07-17 LAB — COMPREHENSIVE METABOLIC PANEL
ALK PHOS: 108 U/L (ref 38–126)
ALT: 21 U/L (ref 17–63)
AST: 27 U/L (ref 15–41)
Albumin: 3.4 g/dL — ABNORMAL LOW (ref 3.5–5.0)
Anion gap: 15 (ref 5–15)
BILIRUBIN TOTAL: 0.8 mg/dL (ref 0.3–1.2)
BUN: 43 mg/dL — ABNORMAL HIGH (ref 6–20)
CALCIUM: 8.7 mg/dL — AB (ref 8.9–10.3)
CO2: 29 mmol/L (ref 22–32)
CREATININE: 9.52 mg/dL — AB (ref 0.61–1.24)
Chloride: 93 mmol/L — ABNORMAL LOW (ref 101–111)
GFR calc Af Amer: 6 mL/min — ABNORMAL LOW (ref >60)
GFR, EST NON AFRICAN AMERICAN: 6 mL/min — AB (ref >60)
Glucose, Bld: 90 mg/dL (ref 65–99)
Potassium: 4.4 mmol/L (ref 3.5–5.1)
Sodium: 137 mmol/L (ref 135–145)
Total Protein: 7.9 g/dL (ref 6.5–8.1)

## 2017-07-17 LAB — CBC WITH DIFFERENTIAL/PLATELET
BASOS ABS: 0 10*3/uL (ref 0.0–0.1)
BASOS PCT: 0 %
EOS PCT: 5 %
Eosinophils Absolute: 0.2 10*3/uL (ref 0.0–0.7)
HCT: 32.4 % — ABNORMAL LOW (ref 39.0–52.0)
Hemoglobin: 10.6 g/dL — ABNORMAL LOW (ref 13.0–17.0)
LYMPHS PCT: 14 %
Lymphs Abs: 0.6 10*3/uL — ABNORMAL LOW (ref 0.7–4.0)
MCH: 29 pg (ref 26.0–34.0)
MCHC: 32.7 g/dL (ref 30.0–36.0)
MCV: 88.5 fL (ref 78.0–100.0)
MONO ABS: 0.6 10*3/uL (ref 0.1–1.0)
Monocytes Relative: 13 %
Neutro Abs: 3 10*3/uL (ref 1.7–7.7)
Neutrophils Relative %: 68 %
Platelets: 129 10*3/uL — ABNORMAL LOW (ref 150–400)
RBC: 3.66 MIL/uL — AB (ref 4.22–5.81)
RDW: 17 % — ABNORMAL HIGH (ref 11.5–15.5)
WBC: 4.5 10*3/uL (ref 4.0–10.5)

## 2017-07-17 LAB — LIPASE, BLOOD: Lipase: 30 U/L (ref 11–51)

## 2017-07-17 MED ORDER — DIPHENHYDRAMINE HCL 50 MG/ML IJ SOLN
25.0000 mg | Freq: Once | INTRAMUSCULAR | Status: AC
  Administered 2017-07-17: 25 mg via INTRAVENOUS
  Filled 2017-07-17: qty 1

## 2017-07-17 MED ORDER — LORAZEPAM 2 MG/ML IJ SOLN
1.0000 mg | Freq: Once | INTRAMUSCULAR | Status: AC
  Administered 2017-07-17: 1 mg via INTRAVENOUS
  Filled 2017-07-17: qty 1

## 2017-07-17 MED ORDER — OXYCODONE HCL 5 MG PO TABS
10.0000 mg | ORAL_TABLET | Freq: Once | ORAL | Status: AC
  Administered 2017-07-17: 10 mg via ORAL
  Filled 2017-07-17: qty 2

## 2017-07-17 MED ORDER — METOCLOPRAMIDE HCL 5 MG/ML IJ SOLN
10.0000 mg | Freq: Once | INTRAMUSCULAR | Status: AC
  Administered 2017-07-17: 10 mg via INTRAVENOUS
  Filled 2017-07-17: qty 2

## 2017-07-17 NOTE — ED Notes (Addendum)
Pt found sitting on the side of the bed falling asleep. Pt redirected to lay in the bed and informed about safety reasons. Pt refused reporting that he is too anxious to do that. Pt falling asleep while talking to this RN.

## 2017-07-17 NOTE — ED Notes (Signed)
Pt pacing up and down hallways stating he is anxious and in pain. Pt redirected back into room. EDP aware.

## 2017-07-17 NOTE — ED Notes (Signed)
Pt ambulatory to restroom. Pt reports an episode of diarrhea.

## 2017-07-17 NOTE — Discharge Instructions (Signed)
Emergency care providers appreciate that many patients coming to [place] are in severe pain and we wish to address their pain in the safest, most responsible manner.  It is important to recognize however, that the proper treatment of chronic pain differs from that of the pain of injuries and acute illnesses.  Our goal is to provide quality, safe, personalized care and we thank you for giving [place] the opportunity to serve you. The use of narcotics and related agents for chronic pain syndromes may lead to additional physical and psychological problems.  Nearly as many people die from prescription narcotics each year as die from car crashes.  Additionally, this risk is increased if such prescriptions are obtained from a variety of sources.  Therefore, only your primary care physician or a pain management specialist is able to safely treat such syndromes with narcotic medications long-term.    Documentation revealing such prescriptions have been sought from multiple sources may prohibit [place] from providing a refill or different narcotic medication.  Your name may be checked first through the [place].  This database is a record of controlled substance medication prescriptions that the patient has received.  This has been established by [facility] in an effort to eliminate the dangerous, and often life threatening, practice of obtaining multiple prescriptions from different medical providers.   If you have a chronic pain syndrome (i.e. chronic headaches, recurrent back or neck pain, dental pain, abdominal or pelvis pain without a specific diagnosis, or neuropathic pain such as fibromyalgia) or recurrent visits for the same condition without an acute diagnosis, you may be treated with non-narcotics and other non-addictive medicines.  Allergic reactions or negative side effects that may be reported by a patient to such medications will not typically lead to the use of a narcotic  analgesic or other controlled substance as an alternative.   Patients managing chronic pain with a personal physician should have provisions in place for breakthrough pain.  If you are in crisis, you should call your physician.  If your physician directs you to the emergency department, please have the doctor call and speak to our attending physician concerning your care.   When patients come to the Emergency Department (ED) with acute medical conditions in which the Emergency Department physician feels appropriate to prescribe narcotic or sedating pain medication, the physician will prescribe these in very limited quantities.  The amount of these medications will last only until you can see your primary care physician in his/her office.  Any patient who returns to the ED seeking refills should expect only non-narcotic pain medications.     Prescriptions for narcotic or sedating medications that have been lost, stolen or expired will not be refilled in the Emergency Department.

## 2017-07-17 NOTE — ED Notes (Signed)
ED Provider at bedside. 
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
## 2017-07-17 NOTE — ED Triage Notes (Signed)
Per EMS, pt from home. Pt c/o abd pain since Monday, reports flare up during dialysis today in which it cut his tx an hour short. Pain has gotten progressively worse throughout the day. Pt reports N/Vx5, denies diarrhea. Hx of pancreatitis. Dialysis MWF.   EMS VS BP 166/118, P 90, R 18, 97% room air. CBG 115

## 2017-07-18 ENCOUNTER — Encounter (HOSPITAL_COMMUNITY): Payer: Self-pay | Admitting: Emergency Medicine

## 2017-07-18 ENCOUNTER — Other Ambulatory Visit: Payer: Self-pay

## 2017-07-18 ENCOUNTER — Emergency Department (HOSPITAL_COMMUNITY)
Admission: EM | Admit: 2017-07-18 | Discharge: 2017-07-18 | Disposition: A | Discharge status: Home / Self Care | Payer: Medicare Other | Attending: Emergency Medicine | Admitting: Emergency Medicine

## 2017-07-18 DIAGNOSIS — Z992 Dependence on renal dialysis: Secondary | ICD-10-CM | POA: Insufficient documentation

## 2017-07-18 DIAGNOSIS — G8929 Other chronic pain: Secondary | ICD-10-CM | POA: Diagnosis not present

## 2017-07-18 DIAGNOSIS — Z87891 Personal history of nicotine dependence: Secondary | ICD-10-CM | POA: Insufficient documentation

## 2017-07-18 DIAGNOSIS — I132 Hypertensive heart and chronic kidney disease with heart failure and with stage 5 chronic kidney disease, or end stage renal disease: Secondary | ICD-10-CM | POA: Insufficient documentation

## 2017-07-18 DIAGNOSIS — F1210 Cannabis abuse, uncomplicated: Secondary | ICD-10-CM | POA: Insufficient documentation

## 2017-07-18 DIAGNOSIS — R112 Nausea with vomiting, unspecified: Secondary | ICD-10-CM | POA: Insufficient documentation

## 2017-07-18 DIAGNOSIS — R109 Unspecified abdominal pain: Secondary | ICD-10-CM | POA: Insufficient documentation

## 2017-07-18 DIAGNOSIS — N186 End stage renal disease: Secondary | ICD-10-CM | POA: Insufficient documentation

## 2017-07-18 DIAGNOSIS — E1122 Type 2 diabetes mellitus with diabetic chronic kidney disease: Secondary | ICD-10-CM | POA: Insufficient documentation

## 2017-07-18 DIAGNOSIS — Z79899 Other long term (current) drug therapy: Secondary | ICD-10-CM | POA: Diagnosis not present

## 2017-07-18 DIAGNOSIS — D631 Anemia in chronic kidney disease: Secondary | ICD-10-CM | POA: Diagnosis not present

## 2017-07-18 DIAGNOSIS — K2970 Gastritis, unspecified, without bleeding: Secondary | ICD-10-CM | POA: Diagnosis not present

## 2017-07-18 DIAGNOSIS — I5042 Chronic combined systolic (congestive) and diastolic (congestive) heart failure: Secondary | ICD-10-CM | POA: Diagnosis not present

## 2017-07-18 DIAGNOSIS — R100 Acute abdomen: Secondary | ICD-10-CM | POA: Diagnosis not present

## 2017-07-18 DIAGNOSIS — N2581 Secondary hyperparathyroidism of renal origin: Secondary | ICD-10-CM | POA: Diagnosis not present

## 2017-07-18 LAB — I-STAT CHEM 8, ED
BUN: 55 mg/dL — ABNORMAL HIGH (ref 6–20)
CREATININE: 12.4 mg/dL — AB (ref 0.61–1.24)
Calcium, Ion: 1.02 mmol/L — ABNORMAL LOW (ref 1.15–1.40)
Chloride: 97 mmol/L — ABNORMAL LOW (ref 101–111)
Glucose, Bld: 77 mg/dL (ref 65–99)
HCT: 37 % — ABNORMAL LOW (ref 39.0–52.0)
HEMOGLOBIN: 12.6 g/dL — AB (ref 13.0–17.0)
Potassium: 5.1 mmol/L (ref 3.5–5.1)
SODIUM: 137 mmol/L (ref 135–145)
TCO2: 27 mmol/L (ref 22–32)

## 2017-07-18 MED ORDER — CLONIDINE HCL 0.3 MG/24HR TD PTWK
0.3000 mg | MEDICATED_PATCH | TRANSDERMAL | Status: DC
  Administered 2017-07-18: 0.3 mg via TRANSDERMAL
  Filled 2017-07-18: qty 1

## 2017-07-18 MED ORDER — PROMETHAZINE HCL 25 MG/ML IJ SOLN
25.0000 mg | Freq: Once | INTRAMUSCULAR | Status: AC
  Administered 2017-07-18: 25 mg via INTRAVENOUS
  Filled 2017-07-18: qty 1

## 2017-07-18 NOTE — ED Notes (Signed)
ED Provider at bedside.

## 2017-07-18 NOTE — ED Notes (Signed)
IV team at bedside 

## 2017-07-18 NOTE — ED Notes (Signed)
2 unsuccessful attempts at starting IV

## 2017-07-18 NOTE — Discharge Planning (Signed)
EDCM contacted [facility] to notify of pt ED encounter. Pt next HD is scheduled for today at 12:00 in the [facility].  No further EDCM needs identified at this time.

## 2017-07-18 NOTE — ED Notes (Signed)
Pt called RN in and stated that he was ready to have his IV placed. RN stated we needed to wait until the provider sees him. RN said ok and walked out of the room
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
## 2017-07-18 NOTE — ED Notes (Signed)
Pt requested to see provider. Provider notified

## 2017-07-18 NOTE — ED Notes (Signed)
Pt given taxi voucher to make it to 1200 dialysis appointment. Bus pass given for transport home

## 2017-07-18 NOTE — ED Triage Notes (Signed)
HD pt [facility] brought to ED by [facility] from home for c/o 10/10 abd pain, pt was seen here yesterday for the same complain and sent home. VS 200/140, HR 90, SPO2 100% RA. Pt refusing to have blood draw on triage states he want to have it done with the IV.

## 2017-07-18 NOTE — Progress Notes (Signed)
CSW provided pt with taxi vouchure to get to dialysis  as dialysis center reports that there is no bus station near the center. At this time there are no further CSW needs at this time. CSW signing off.     [name] [name], MSW, [facility] Clinical Social Worker [phone]

## 2017-07-18 NOTE — Discharge Instructions (Signed)
Stay on clear liquid diet.  Follow-up with a family doctor for further evaluation and treatment.  Please get your dialysis done.

## 2017-07-18 NOTE — Progress Notes (Signed)
CSW spoke with pt on 07/17/17 in ED lobby. CSW provided pt with a cab voucher to get home as pt reports that the bus system does not travel to the part of [place] in which pt reports living. CSW informed RN that if pt is in need of transportation today CSW would provide pt with bus pass to get to location of choice.      Frank [name], MSW, [name] Clinical Social Worker [phone]

## 2017-07-21 ENCOUNTER — Encounter (HOSPITAL_COMMUNITY): Payer: Self-pay | Admitting: Emergency Medicine

## 2017-07-21 ENCOUNTER — Emergency Department (HOSPITAL_COMMUNITY): Payer: Medicare Other

## 2017-07-21 ENCOUNTER — Encounter (HOSPITAL_COMMUNITY): Payer: Self-pay

## 2017-07-21 ENCOUNTER — Emergency Department (HOSPITAL_COMMUNITY)
Admission: EM | Admit: 2017-07-21 | Discharge: 2017-07-21 | Disposition: A | Discharge status: Home / Self Care | Payer: Medicare Other | Source: Home / Self Care | Attending: Emergency Medicine | Admitting: Emergency Medicine

## 2017-07-21 ENCOUNTER — Other Ambulatory Visit: Payer: Self-pay

## 2017-07-21 ENCOUNTER — Inpatient Hospital Stay (HOSPITAL_COMMUNITY)
Admission: EM | Admit: 2017-07-21 | Discharge: 2017-07-24 | DRG: 291 | Disposition: A | Discharge status: Home / Self Care | Payer: Medicare Other | Attending: Family Medicine | Admitting: Family Medicine

## 2017-07-21 DIAGNOSIS — R791 Abnormal coagulation profile: Secondary | ICD-10-CM

## 2017-07-21 DIAGNOSIS — Z7901 Long term (current) use of anticoagulants: Secondary | ICD-10-CM | POA: Insufficient documentation

## 2017-07-21 DIAGNOSIS — Z885 Allergy status to narcotic agent status: Secondary | ICD-10-CM

## 2017-07-21 DIAGNOSIS — R1110 Vomiting, unspecified: Secondary | ICD-10-CM | POA: Diagnosis not present

## 2017-07-21 DIAGNOSIS — M898X9 Other specified disorders of bone, unspecified site: Secondary | ICD-10-CM | POA: Diagnosis present

## 2017-07-21 DIAGNOSIS — Z9114 Patient's other noncompliance with medication regimen: Secondary | ICD-10-CM

## 2017-07-21 DIAGNOSIS — G894 Chronic pain syndrome: Secondary | ICD-10-CM

## 2017-07-21 DIAGNOSIS — I5042 Chronic combined systolic (congestive) and diastolic (congestive) heart failure: Secondary | ICD-10-CM

## 2017-07-21 DIAGNOSIS — R7989 Other specified abnormal findings of blood chemistry: Secondary | ICD-10-CM

## 2017-07-21 DIAGNOSIS — N186 End stage renal disease: Secondary | ICD-10-CM | POA: Diagnosis present

## 2017-07-21 DIAGNOSIS — R0600 Dyspnea, unspecified: Secondary | ICD-10-CM | POA: Diagnosis present

## 2017-07-21 DIAGNOSIS — R0902 Hypoxemia: Secondary | ICD-10-CM

## 2017-07-21 DIAGNOSIS — G4733 Obstructive sleep apnea (adult) (pediatric): Secondary | ICD-10-CM | POA: Diagnosis present

## 2017-07-21 DIAGNOSIS — Z992 Dependence on renal dialysis: Secondary | ICD-10-CM

## 2017-07-21 DIAGNOSIS — Z87891 Personal history of nicotine dependence: Secondary | ICD-10-CM

## 2017-07-21 DIAGNOSIS — Z79899 Other long term (current) drug therapy: Secondary | ICD-10-CM | POA: Insufficient documentation

## 2017-07-21 DIAGNOSIS — R112 Nausea with vomiting, unspecified: Secondary | ICD-10-CM

## 2017-07-21 DIAGNOSIS — I700 Atherosclerosis of aorta: Secondary | ICD-10-CM | POA: Diagnosis present

## 2017-07-21 DIAGNOSIS — I132 Hypertensive heart and chronic kidney disease with heart failure and with stage 5 chronic kidney disease, or end stage renal disease: Secondary | ICD-10-CM | POA: Diagnosis not present

## 2017-07-21 DIAGNOSIS — Z86718 Personal history of other venous thrombosis and embolism: Secondary | ICD-10-CM

## 2017-07-21 DIAGNOSIS — R109 Unspecified abdominal pain: Principal | ICD-10-CM

## 2017-07-21 DIAGNOSIS — E1122 Type 2 diabetes mellitus with diabetic chronic kidney disease: Secondary | ICD-10-CM | POA: Diagnosis present

## 2017-07-21 DIAGNOSIS — G8929 Other chronic pain: Secondary | ICD-10-CM

## 2017-07-21 DIAGNOSIS — E119 Type 2 diabetes mellitus without complications: Secondary | ICD-10-CM

## 2017-07-21 DIAGNOSIS — R0602 Shortness of breath: Secondary | ICD-10-CM | POA: Diagnosis not present

## 2017-07-21 DIAGNOSIS — Z888 Allergy status to other drugs, medicaments and biological substances status: Secondary | ICD-10-CM

## 2017-07-21 DIAGNOSIS — N2581 Secondary hyperparathyroidism of renal origin: Secondary | ICD-10-CM | POA: Diagnosis present

## 2017-07-21 DIAGNOSIS — F329 Major depressive disorder, single episode, unspecified: Secondary | ICD-10-CM | POA: Diagnosis present

## 2017-07-21 DIAGNOSIS — D631 Anemia in chronic kidney disease: Secondary | ICD-10-CM | POA: Diagnosis not present

## 2017-07-21 DIAGNOSIS — I120 Hypertensive chronic kidney disease with stage 5 chronic kidney disease or end stage renal disease: Secondary | ICD-10-CM | POA: Diagnosis not present

## 2017-07-21 DIAGNOSIS — F419 Anxiety disorder, unspecified: Secondary | ICD-10-CM | POA: Diagnosis present

## 2017-07-21 DIAGNOSIS — R079 Chest pain, unspecified: Secondary | ICD-10-CM | POA: Diagnosis not present

## 2017-07-21 DIAGNOSIS — Z9119 Patient's noncompliance with other medical treatment and regimen: Secondary | ICD-10-CM

## 2017-07-21 DIAGNOSIS — T8610 Unspecified complication of kidney transplant: Secondary | ICD-10-CM | POA: Diagnosis not present

## 2017-07-21 DIAGNOSIS — F1210 Cannabis abuse, uncomplicated: Secondary | ICD-10-CM | POA: Diagnosis present

## 2017-07-21 DIAGNOSIS — I428 Other cardiomyopathies: Secondary | ICD-10-CM | POA: Diagnosis present

## 2017-07-21 DIAGNOSIS — K219 Gastro-esophageal reflux disease without esophagitis: Secondary | ICD-10-CM | POA: Diagnosis present

## 2017-07-21 DIAGNOSIS — E785 Hyperlipidemia, unspecified: Secondary | ICD-10-CM | POA: Diagnosis present

## 2017-07-21 DIAGNOSIS — J810 Acute pulmonary edema: Secondary | ICD-10-CM

## 2017-07-21 DIAGNOSIS — E875 Hyperkalemia: Secondary | ICD-10-CM | POA: Diagnosis present

## 2017-07-21 HISTORY — DX: Dyspnea, unspecified: R06.00

## 2017-07-21 LAB — CBC
HCT: 31.6 % — ABNORMAL LOW (ref 39.0–52.0)
HEMATOCRIT: 31.2 % — AB (ref 39.0–52.0)
HEMOGLOBIN: 10.3 g/dL — AB (ref 13.0–17.0)
Hemoglobin: 10.2 g/dL — ABNORMAL LOW (ref 13.0–17.0)
MCH: 28.2 pg (ref 26.0–34.0)
MCH: 28.6 pg (ref 26.0–34.0)
MCHC: 32.3 g/dL (ref 30.0–36.0)
MCHC: 33 g/dL (ref 30.0–36.0)
MCV: 87.3 fL (ref 78.0–100.0)
MCV: 86.7 fL (ref 78.0–100.0)
Platelets: 138 10*3/uL — ABNORMAL LOW (ref 150–400)
Platelets: 124 10*3/uL — ABNORMAL LOW (ref 150–400)
RBC: 3.62 MIL/uL — ABNORMAL LOW (ref 4.22–5.81)
RBC: 3.6 MIL/uL — AB (ref 4.22–5.81)
RDW: 17.2 % — AB (ref 11.5–15.5)
RDW: 17.1 % — ABNORMAL HIGH (ref 11.5–15.5)
WBC: 4.8 10*3/uL (ref 4.0–10.5)
WBC: 4.9 10*3/uL (ref 4.0–10.5)

## 2017-07-21 LAB — RENAL FUNCTION PANEL
ALBUMIN: 3.1 g/dL — AB (ref 3.5–5.0)
ANION GAP: 18 — AB (ref 5–15)
BUN: 95 mg/dL — ABNORMAL HIGH (ref 6–20)
CO2: 23 mmol/L (ref 22–32)
Calcium: 9.6 mg/dL (ref 8.9–10.3)
Chloride: 95 mmol/L — ABNORMAL LOW (ref 101–111)
Creatinine, Ser: 14.21 mg/dL — ABNORMAL HIGH (ref 0.61–1.24)
GFR calc non Af Amer: 3 mL/min — ABNORMAL LOW (ref >60)
GFR, EST AFRICAN AMERICAN: 4 mL/min — AB (ref >60)
GLUCOSE: 91 mg/dL (ref 65–99)
PHOSPHORUS: 7.4 mg/dL — AB (ref 2.5–4.6)
POTASSIUM: 5.9 mmol/L — AB (ref 3.5–5.1)
Sodium: 136 mmol/L (ref 135–145)

## 2017-07-21 LAB — PROTIME-INR
INR: 1.36
PROTHROMBIN TIME: 16.7 s — AB (ref 11.4–15.2)

## 2017-07-21 LAB — COMPREHENSIVE METABOLIC PANEL
ALK PHOS: 125 U/L (ref 38–126)
ALT: 69 U/L — AB (ref 17–63)
ANION GAP: 17 — AB (ref 5–15)
AST: 80 U/L — ABNORMAL HIGH (ref 15–41)
Albumin: 3.1 g/dL — ABNORMAL LOW (ref 3.5–5.0)
BILIRUBIN TOTAL: 1 mg/dL (ref 0.3–1.2)
BUN: 75 mg/dL — ABNORMAL HIGH (ref 6–20)
CALCIUM: 9.4 mg/dL (ref 8.9–10.3)
CO2: 22 mmol/L (ref 22–32)
CREATININE: 12.79 mg/dL — AB (ref 0.61–1.24)
Chloride: 95 mmol/L — ABNORMAL LOW (ref 101–111)
GFR, EST AFRICAN AMERICAN: 4 mL/min — AB (ref >60)
GFR, EST NON AFRICAN AMERICAN: 4 mL/min — AB (ref >60)
Glucose, Bld: 97 mg/dL (ref 65–99)
Potassium: 5.1 mmol/L (ref 3.5–5.1)
SODIUM: 134 mmol/L — AB (ref 135–145)
TOTAL PROTEIN: 7.5 g/dL (ref 6.5–8.1)

## 2017-07-21 LAB — LIPASE, BLOOD: Lipase: 26 U/L (ref 11–51)

## 2017-07-21 LAB — I-STAT TROPONIN, ED: Troponin i, poc: 0.06 ng/mL (ref 0.00–0.08)

## 2017-07-21 MED ORDER — SODIUM CHLORIDE 0.9% FLUSH: 3.0000 mL | INTRAVENOUS | Status: DC | PRN

## 2017-07-21 MED ORDER — LIDOCAINE HCL (PF) 1 % IJ SOLN: 5.0000 mL | INTRAMUSCULAR | Status: DC | PRN

## 2017-07-21 MED ORDER — SODIUM CHLORIDE 0.9 % IV SOLN: 250.0000 mL | INTRAVENOUS | Status: DC | PRN

## 2017-07-21 MED ORDER — HEPARIN SODIUM (PORCINE) 1000 UNIT/ML DIALYSIS: 1000.0000 [IU] | INTRAMUSCULAR | Status: DC | PRN

## 2017-07-21 MED ORDER — ENOXAPARIN SODIUM 300 MG/3ML IJ SOLN
1.5000 mg/kg | Freq: Once | INTRAMUSCULAR | Status: DC
  Filled 2017-07-21: qty 1.55

## 2017-07-21 MED ORDER — ALTEPLASE 2 MG IJ SOLR: 2.0000 mg | Freq: Once | INTRAMUSCULAR | Status: DC | PRN

## 2017-07-21 MED ORDER — ONDANSETRON HCL 4 MG/2ML IJ SOLN
4.0000 mg | Freq: Four times a day (QID) | INTRAMUSCULAR | Status: DC | PRN
  Administered 2017-07-21: 4 mg via INTRAVENOUS
  Filled 2017-07-21: qty 2

## 2017-07-21 MED ORDER — ONDANSETRON 4 MG PO TBDP: 4.0000 mg | ORAL_TABLET | Freq: Three times a day (TID) | ORAL | 0 refills | Status: DC | PRN

## 2017-07-21 MED ORDER — HEPARIN SODIUM (PORCINE) 1000 UNIT/ML DIALYSIS: 2000.0000 [IU] | Freq: Once | INTRAMUSCULAR | Status: DC

## 2017-07-21 MED ORDER — ENOXAPARIN SODIUM 150 MG/ML ~~LOC~~ SOLN: 1.0000 mg/kg | Freq: Two times a day (BID) | SUBCUTANEOUS | 0 refills | Status: DC

## 2017-07-21 MED ORDER — ONDANSETRON HCL 4 MG PO TABS: 4.0000 mg | ORAL_TABLET | Freq: Four times a day (QID) | ORAL | Status: DC | PRN

## 2017-07-21 MED ORDER — ONDANSETRON 4 MG PO TBDP
8.0000 mg | ORAL_TABLET | Freq: Once | ORAL | Status: AC
  Administered 2017-07-21: 8 mg via ORAL
  Filled 2017-07-21: qty 2

## 2017-07-21 MED ORDER — PENTAFLUOROPROP-TETRAFLUOROETH EX AERO: 1.0000 "application " | INHALATION_SPRAY | CUTANEOUS | Status: DC | PRN

## 2017-07-21 MED ORDER — SODIUM CHLORIDE 0.9% FLUSH
3.0000 mL | Freq: Two times a day (BID) | INTRAVENOUS | Status: DC
  Administered 2017-07-21 – 2017-07-23 (×4): 3 mL via INTRAVENOUS

## 2017-07-21 MED ORDER — HYDRALAZINE HCL 20 MG/ML IJ SOLN: 5.0000 mg | Freq: Three times a day (TID) | INTRAMUSCULAR | Status: DC | PRN

## 2017-07-21 MED ORDER — AMLODIPINE BESYLATE 10 MG PO TABS
10.0000 mg | ORAL_TABLET | Freq: Every day | ORAL | Status: DC
  Administered 2017-07-21 – 2017-07-23 (×3): 10 mg via ORAL
  Filled 2017-07-21 (×3): qty 1

## 2017-07-21 MED ORDER — TRAMADOL HCL 50 MG PO TABS
50.0000 mg | ORAL_TABLET | Freq: Four times a day (QID) | ORAL | Status: DC | PRN
  Administered 2017-07-21 – 2017-07-24 (×4): 50 mg via ORAL
  Filled 2017-07-21 (×4): qty 1

## 2017-07-21 MED ORDER — SODIUM CHLORIDE 0.9 % IV SOLN: 100.0000 mL | INTRAVENOUS | Status: DC | PRN

## 2017-07-21 MED ORDER — AMITRIPTYLINE HCL 10 MG PO TABS
10.0000 mg | ORAL_TABLET | Freq: Every day | ORAL | Status: DC
  Administered 2017-07-21 – 2017-07-23 (×3): 10 mg via ORAL
  Filled 2017-07-21 (×3): qty 1

## 2017-07-21 MED ORDER — PANTOPRAZOLE SODIUM 40 MG PO TBEC
40.0000 mg | DELAYED_RELEASE_TABLET | Freq: Two times a day (BID) | ORAL | Status: DC
  Administered 2017-07-21 – 2017-07-24 (×6): 40 mg via ORAL
  Filled 2017-07-21 (×6): qty 1

## 2017-07-21 MED ORDER — WARFARIN - PHARMACIST DOSING INPATIENT: Freq: Every day | Status: DC

## 2017-07-21 MED ORDER — POLYETHYLENE GLYCOL 3350 17 G PO PACK: 17.0000 g | PACK | Freq: Every day | ORAL | Status: DC | PRN

## 2017-07-21 MED ORDER — WARFARIN SODIUM 7.5 MG PO TABS
7.5000 mg | ORAL_TABLET | Freq: Once | ORAL | Status: DC
  Filled 2017-07-21: qty 1

## 2017-07-21 MED ORDER — ENOXAPARIN SODIUM 100 MG/ML ~~LOC~~ SOLN
100.0000 mg | Freq: Once | SUBCUTANEOUS | Status: AC
  Administered 2017-07-21: 100 mg via SUBCUTANEOUS
  Filled 2017-07-21: qty 1

## 2017-07-21 MED ORDER — CARVEDILOL 25 MG PO TABS
25.0000 mg | ORAL_TABLET | Freq: Two times a day (BID) | ORAL | Status: DC
  Administered 2017-07-21 – 2017-07-24 (×6): 25 mg via ORAL
  Filled 2017-07-21 (×6): qty 1

## 2017-07-21 MED ORDER — LIDOCAINE-PRILOCAINE 2.5-2.5 % EX CREA: 1.0000 "application " | TOPICAL_CREAM | CUTANEOUS | Status: DC | PRN

## 2017-07-21 NOTE — ED Notes (Signed)
Lab notified staff that patient was ready for his xray. RN called and notified Radiology department

## 2017-07-21 NOTE — Progress Notes (Signed)
Pt is now on NIV at this time. Pt is tolerating it well. Settings are adjusted per patient comfort. Lab is at the bedside at this time.
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
## 2017-07-21 NOTE — Consult Note (Signed)
Reason for Consult: ESRD Referring Physician: Dr. Yisroel Ramming  Chief Complaint: Dyspnea/Abdominal pain  Dialysis Orders: MWF  -SGB 4hrs, BFR400, DFRAutoflow 2.0, EDW 81kg but has been leaving below EDW,2K/2.25Ca,  Access:Lt Cimino AVF Heparin2000units IV Hectorol 11mg IV + Sensipar 962mPO daily (?  Compliance) mircera 15088mV q2wks last 12/28  Assessment/Plan: 1.  Abdominal pain/nausea - chronic for well over a month w/ no alarm symptoms. 2.  ESRD -  Plan for HD in the AM; he's able to speak in full sentences and not on O2. 3.  Hypertension/volume  - has left below EDW -> slowly challenge EDW (currently 81kg).   4.  Anemia  - stable 5.  Metabolic bone disease -  Resume outpatient meds when able to take po - On Renvela 3/2 meals/snacks, IV Hectorol, Sensipar 30m9mily -> may consider conversion to Parsabiv (compliance is very questionable). If the PTH cont to incr he will benefit from a PTX; currently 2200. 6.  Nutrition - renal diet when able to take PO 7. H/O DVT and PE - On Coumadin but has not been able to keep it down. 8. Hypertension - restart home meds and challenging of EDW will help as well.    HPI: Frank DAHMSan 51 y4. male ESRD [place] [facility] [place] Fri [place]28 for 3.5 hrs. He initially came to the ED early this AM for dyspnea, nausea, abdominal discomfort and was seen by Dr. CampVenora Maplesh a benign exam and imaging results as well with D/C w/ Lovenox. He never left the hospital and then went to [facility] [place] for a fistulogram bec he states that the fistula has not been working well with pain during dialysis. He actually stated that it was thrombosed this AM and the current exam is very different that this AM. [facility] was called to see the pt this AM but with the h/o dyspnea, nausea the picture was of a more unstable pt. In addition the pt refused to leave the hospital and wanted the fistulogram to be completed during this hospitalization. [facility]  also deemed him to unstable for a fistulogram and requested an ED evaluation.  ROS Pertinent items are noted in HPI.  Chemistry and CBC: Creatinine, Ser  Date/Time Value Ref Range Status  07/21/2017 01:33 AM 12.79 (H) 0.61 - 1.24 mg/dL Final  07/18/2017 08:03 AM 12.40 (H) 0.61 - 1.24 mg/dL Final  07/17/2017 04:32 AM 9.52 (H) 0.61 - 1.24 mg/dL Final  07/03/2017 06:55 PM 16.24 (H) 0.61 - 1.24 mg/dL Final  07/03/2017 03:27 PM 16.07 (H) 0.61 - 1.24 mg/dL Final  07/03/2017 10:03 AM 15.84 (H) 0.61 - 1.24 mg/dL Final  07/03/2017 03:33 AM 14.85 (H) 0.61 - 1.24 mg/dL Final  06/29/2017 11:09 PM 13.80 (H) 0.61 - 1.24 mg/dL Final  06/29/2017 11:02 PM 14.40 (H) 0.61 - 1.24 mg/dL Final  06/27/2017 09:15 AM 7.94 (H) 0.61 - 1.24 mg/dL Final  06/26/2017 05:18 AM 10.63 (H) 0.61 - 1.24 mg/dL Final  06/25/2017 09:35 AM 8.40 (H) 0.61 - 1.24 mg/dL Final    Comment:    DELTA CHECK NOTED DIALYSIS   06/24/2017 08:03 PM 14.80 (H) 0.61 - 1.24 mg/dL Final  06/15/2017 08:27 PM 11.44 (H) 0.61 - 1.24 mg/dL Final  06/12/2017 09:58 PM 12.60 (H) 0.61 - 1.24 mg/dL Final  06/02/2017 01:11 AM 13.07 (H) 0.61 - 1.24 mg/dL Final  05/29/2017 04:07 AM 9.89 (H) 0.61 - 1.24 mg/dL Final  05/26/2017 08:31 AM 13.93 (H) 0.61 -  1.24 mg/dL Final  05/23/2017 02:17 AM 11.81 (H) 0.61 - 1.24 mg/dL Final  05/06/2017 07:15 AM 9.17 (H) 0.61 - 1.24 mg/dL Final  05/05/2017 08:15 PM 7.52 (H) 0.61 - 1.24 mg/dL Final    Comment:    DELTA CHECK NOTED  05/05/2017 01:58 PM 13.32 (H) 0.61 - 1.24 mg/dL Final  05/03/2017 10:03 PM 10.35 (H) 0.61 - 1.24 mg/dL Final  05/01/2017 04:34 AM 10.80 (H) 0.61 - 1.24 mg/dL Final  04/28/2017 09:32 AM 8.56 (H) 0.61 - 1.24 mg/dL Final    Comment:    DELTA CHECK NOTED DIALYSIS   04/27/2017 06:40 PM 15.38 (H) 0.61 - 1.24 mg/dL Final  04/22/2017 01:12 AM 9.80 (H) 0.61 - 1.24 mg/dL Final    Comment:    DELTA CHECK NOTED  04/21/2017 12:42 AM 14.82 (H) 0.61 - 1.24 mg/dL Final  04/20/2017 07:21 PM 14.04 (H)  0.61 - 1.24 mg/dL Final  03/30/2017 08:48 PM 13.70 (H) 0.61 - 1.24 mg/dL Final  03/23/2017 01:29 AM 6.19 (H) 0.61 - 1.24 mg/dL Final  03/14/2017 03:04 AM 8.95 (H) 0.61 - 1.24 mg/dL Final    Comment:    DELTA CHECK NOTED  03/13/2017 03:48 PM 17.11 (H) 0.61 - 1.24 mg/dL Final  03/13/2017 02:47 AM 17.20 (H) 0.61 - 1.24 mg/dL Final  03/13/2017 12:52 AM 16.08 (H) 0.61 - 1.24 mg/dL Final  03/10/2017 04:38 AM 10.70 (H) 0.61 - 1.24 mg/dL Final  03/02/2017 12:58 AM 14.79 (H) 0.61 - 1.24 mg/dL Final  02/23/2017 08:37 PM 13.90 (H) 0.61 - 1.24 mg/dL Final  01/25/2017 06:40 AM 10.70 (H) 0.61 - 1.24 mg/dL Final  01/25/2017 06:32 AM 10.10 (H) 0.61 - 1.24 mg/dL Final  01/17/2017 01:52 AM 13.30 (H) 0.61 - 1.24 mg/dL Final  01/16/2017 08:52 PM 13.36 (H) 0.61 - 1.24 mg/dL Final  01/16/2017 08:13 PM 13.40 (H) 0.61 - 1.24 mg/dL Final  01/13/2017 05:14 AM 6.41 (H) 0.61 - 1.24 mg/dL Final    Comment:    DELTA CHECK NOTED  01/12/2017 06:52 AM 10.79 (H) 0.61 - 1.24 mg/dL Final  01/11/2017 10:58 PM 10.17 (H) 0.61 - 1.24 mg/dL Final  01/06/2017 07:26 AM 15.19 (H) 0.61 - 1.24 mg/dL Final  01/06/2017 01:41 AM 15.08 (H) 0.61 - 1.24 mg/dL Final  01/05/2017 09:16 PM 13.96 (H) 0.61 - 1.24 mg/dL Final  01/05/2017 03:01 AM 13.88 (H) 0.61 - 1.24 mg/dL Final  01/04/2017 08:58 PM 14.04 (H) 0.61 - 1.24 mg/dL Final  11/30/2016 05:20 AM 8.41 (H) 0.61 - 1.24 mg/dL Final    Comment:    DELTA CHECK NOTED   Recent Labs  Lab 07/17/17 0432 07/18/17 0803 07/21/17 0133  NA 137 137 134*  K 4.4 5.1 5.1  CL 93* 97* 95*  CO2 29  --  22  GLUCOSE 90 77 97  BUN 43* 55* 75*  CREATININE 9.52* 12.40* 12.79*  CALCIUM 8.7*  --  9.4   Recent Labs  Lab 07/17/17 0432 07/18/17 0803 07/21/17 0320  WBC 4.5  --  4.8  NEUTROABS 3.0  --   --   HGB 10.6* 12.6* 10.2*  HCT 32.4* 37.0* 31.6*  MCV 88.5  --  87.3  PLT 129*  --  138*   Liver Function Tests: Recent Labs  Lab 07/17/17 0432 07/21/17 0133  AST 27 80*  ALT 21 69*   ALKPHOS 108 125  BILITOT 0.8 1.0  PROT 7.9 7.5  ALBUMIN 3.4* 3.1*   Recent Labs  Lab 07/17/17 0432 07/21/17 0133  LIPASE 30 26  No results for input(s): AMMONIA in the last 168 hours. Cardiac Enzymes: No results for input(s): CKTOTAL, CKMB, CKMBINDEX, TROPONINI in the last 168 hours. Iron Studies: No results for input(s): IRON, TIBC, TRANSFERRIN, FERRITIN in the last 72 hours. PT/INR: _0 (inr:5)  Xrays/Other Studies: ) Results for orders placed or performed during the hospital encounter of 07/21/17 (from the past 48 hour(s))  Comprehensive metabolic panel     Status: Abnormal   Collection Time: 07/21/17  1:33 AM  Result Value Ref Range   Sodium 134 (L) 135 - 145 mmol/L   Potassium 5.1 3.5 - 5.1 mmol/L   Chloride 95 (L) 101 - 111 mmol/L   CO2 22 22 - 32 mmol/L   Glucose, Bld 97 65 - 99 mg/dL   BUN 75 (H) 6 - 20 mg/dL   Creatinine, Ser 12.79 (H) 0.61 - 1.24 mg/dL   Calcium 9.4 8.9 - 10.3 mg/dL   Total Protein 7.5 6.5 - 8.1 g/dL   Albumin 3.1 (L) 3.5 - 5.0 g/dL   AST 80 (H) 15 - 41 U/L   ALT 69 (H) 17 - 63 U/L   Alkaline Phosphatase 125 38 - 126 U/L   Total Bilirubin 1.0 0.3 - 1.2 mg/dL   GFR calc non Af Amer 4 (L) >60 mL/min   GFR calc Af Amer 4 (L) >60 mL/min    Comment: (NOTE) The eGFR has been calculated using the CKD EPI equation. This calculation has not been validated in all clinical situations. eGFR's persistently <60 mL/min signify possible Chronic Kidney Disease.    Anion gap 17 (H) 5 - 15  Lipase, blood     Status: None   Collection Time: 07/21/17  1:33 AM  Result Value Ref Range   Lipase 26 11 - 51 U/L  Protime-INR     Status: Abnormal   Collection Time: 07/21/17  1:33 AM  Result Value Ref Range   Prothrombin Time 16.7 (H) 11.4 - 15.2 seconds   INR 1.36   I-stat troponin, ED     Status: None   Collection Time: 07/21/17  1:44 AM  Result Value Ref Range   Troponin i, poc 0.06 0.00 - 0.08 ng/mL   Comment 3            Comment: Due to the  release kinetics of cTnI, a negative result within the first hours of the onset of symptoms does not rule out myocardial infarction with certainty. If myocardial infarction is still suspected, repeat the test at appropriate intervals.   CBC     Status: Abnormal   Collection Time: 07/21/17  3:20 AM  Result Value Ref Range   WBC 4.8 4.0 - 10.5 K/uL   RBC 3.62 (L) 4.22 - 5.81 MIL/uL   Hemoglobin 10.2 (L) 13.0 - 17.0 g/dL   HCT 31.6 (L) 39.0 - 52.0 %   MCV 87.3 78.0 - 100.0 fL   MCH 28.2 26.0 - 34.0 pg   MCHC 32.3 30.0 - 36.0 g/dL   RDW 17.2 (H) 11.5 - 15.5 %   Platelets 138 (L) 150 - 400 K/uL   Dg Chest Portable 1 View  Result Date: 07/21/2017 CLINICAL DATA:  Shortness of breath. History of CHF, dialysis dependent renal failure, nonischemic cardiomyopathy. EXAM: PORTABLE CHEST 1 VIEW COMPARISON:  Chest x-ray of July 21, 2017 included with a acute abdominal series. PA and lateral chest x-ray of June 29, 2017. FINDINGS: The lungs are well-expanded. The interstitial markings are increased. The pulmonary vascularity is engorged. The cardiac  silhouette is enlarged. There is calcification in the wall of the aortic arch. The mediastinum is normal in width. There is no pleural effusion. The bony thorax is unremarkable. IMPRESSION: CHF with mild interstitial edema not greatly changed from earlier today nor from the June 29, 2017 chest x-ray. Thoracic aortic atherosclerosis. Electronically Signed   By: David  Martinique M.D.   On: 07/21/2017 17:02   Dg Abdomen Acute W/chest  Result Date: 07/21/2017 CLINICAL DATA:  51 y/o  M; chest pain and vomiting. EXAM: DG ABDOMEN ACUTE W/ 1V CHEST COMPARISON:  06/29/2017 chest radiograph. 07/03/2017 abdomen radiograph. FINDINGS: Stable severe cardiomegaly. Stable diffuse reticular opacities. No pleural effusion or pneumothorax. Bones are unremarkable. Aortic atherosclerosis with calcification. Normal bowel gas pattern. No radiopaque urinary stone disease  identified. Left hemipelvis linear calcifications. IMPRESSION: 1. Stable severe cardiomegaly and interstitial pulmonary edema. 2. Normal bowel gas pattern. Electronically Signed   By: Kristine Garbe M.D.   On: 07/21/2017 02:08    PMH:   Past Medical History:  Diagnosis Date  . Anemia   . Anxiety   . Chronic combined systolic and diastolic CHF (congestive heart failure) (HCC)    a. EF 20-25% by echo in 08/2015 b. echo 10/2015: EF 35-40%, diffuse HK, severe LAE, moderate RAE, small pericardial effusion  . Complication of anesthesia    itching, sore throat  . Depression   . Dialysis patient ([place])   . DVT (deep venous thrombosis) ([place]) 02/2017  . ESRD (end stage renal disease) ([place])    due to HTN per patient, followed at [facility], s/p failed kidney transplant - dialysis Tue, Th, Sat  . Hyperkalemia 12/2015  . Hypertension   . Junctional rhythm    a. noted in 08/2015: hyperkalemic at that time  b. 12/2015: presented in junctional rhythm w/ K+ of 6.6. Resolved with improvement of K+ levels.  . Nonischemic cardiomyopathy ([place])    a. 08/2014: cath showing minimal CAD, but tortuous arteries noted.   . Personal history of DVT (deep vein thrombosis)/ PE 05/26/2016   In Oct 2015 had small subsemental LUL PE w/o DVT (LE dopplers neg) and was felt to be HD cath related, treated w coumadin.  IN May 2016 had small vein DVT (acute/subacute) in the R basilic/ brachial veins of the RUE, resumed on coumadin.  Had R sided HD cath at that time.    . Renal insufficiency   . Shortness of breath   . Type II diabetes mellitus (HCC)    No history per patient, but remains under history as A1c would not be accurate given on dialysis    PSH:   Past Surgical History:  Procedure Laterality Date  . CAPD INSERTION    . CAPD REMOVAL    . INGUINAL HERNIA REPAIR Right 02/14/2015   Procedure: REPAIR INCARCERATED RIGHT INGUINAL HERNIA;  Surgeon: [name], MD;  Location: [place];  Service: General;  Laterality:  Right;  . INSERTION OF DIALYSIS CATHETER Right 09/23/2015   Procedure: exchange of Right internal Dialysis Catheter.;  Surgeon: [name], MD;  Location: [place];  Service: Vascular;  Laterality: Right;  . IR GENERIC HISTORICAL  07/16/2016   IR US GUIDE VASC ACCESS LEFT 07/16/2016 Corrie Mckusick, DO [facility]  . IR GENERIC HISTORICAL Left 07/16/2016   IR THROMBECTOMY AV FISTULA W/THROMBOLYSIS/PTA INC/SHUNT/IMG LEFT 07/16/2016 Corrie Mckusick, DO [facility]  . KIDNEY RECEIPIENT  2006   failed and started HD in March 2014  . LEFT HEART CATHETERIZATION WITH CORONARY ANGIOGRAM N/A  09/02/2014   Procedure: LEFT HEART CATHETERIZATION WITH CORONARY ANGIOGRAM;  Surgeon: [name], MD;  Location: [facility] CATH LAB;  Service: Cardiovascular;  Laterality: N/A;    Allergies:  Allergies  Allergen Reactions  . Butalbital-Apap-Caffeine Shortness Of Breath, Swelling and Other (See Comments)    Swelling in throat  . Ferrlecit [Na Ferric Gluc Cplx In Sucrose] Shortness Of Breath, Swelling and Other (See Comments)    Swelling in throat, tolerates Venofor  . Minoxidil Shortness Of Breath  . Tylenol [Acetaminophen] Anaphylaxis and Swelling  . Darvocet [Propoxyphene N-Acetaminophen] Hives    Medications:   Prior to Admission medications   Medication Sig Start Date End Date Taking? Authorizing Provider  amitriptyline (ELAVIL) 10 MG tablet Take 1 tablet (10 mg total) by mouth at bedtime. 07/04/17   Steve Rattler, DO  amLODipine (NORVASC) 10 MG tablet Take 1 tablet (10 mg total) by mouth at bedtime. 07/04/17   Steve Rattler, DO  carvedilol (COREG) 25 MG tablet Take 1 tablet (25 mg total) by mouth 2 (two) times daily with a meal. 07/04/17   Riccio, Gardiner Rhyme, DO  cloNIDine (CATAPRES - DOSED IN MG/24 HR) 0.3 mg/24hr patch Place 0.3 mg onto the skin every Wednesday.  12/31/16   [provider]  dicyclomine (BENTYL) 20 MG tablet Take 20 mg by mouth every 6 (six) hours. 04/26/17   [provider]  docusate sodium (COLACE) 100 MG capsule Take 1 capsule (100 mg total) by mouth 2 (two) times daily. 06/26/17   Steve Rattler, DO  doxazosin (CARDURA) 4 MG tablet Take 4 mg by mouth at bedtime.    [provider]  enoxaparin (LOVENOX) 150 MG/ML injection Inject 0.69 mLs (105 mg total) into the skin every 12 (twelve) hours for 5 days. 07/21/17 07/26/17  [name], MD  gabapentin (NEURONTIN) 100 MG capsule Take 100 mg by mouth at bedtime. 03/20/17   [provider]  gabapentin (NEURONTIN) 100 MG capsule Take 1 capsule (100 mg total) by mouth at bedtime. 07/04/17   Steve Rattler, DO  hydrALAZINE (APRESOLINE) 100 MG tablet Take 1 tablet (100 mg total) by mouth 2 (two) times daily. 07/04/17   Steve Rattler, DO  isosorbide mononitrate (IMDUR) 60 MG 24 hr tablet Take 1 tablet (60 mg total) by mouth daily. 07/05/17   Steve Rattler, DO  metoCLOPramide (REGLAN) 5 MG tablet Take 1 tablet (5 mg total) by mouth every 8 (eight) hours as needed for nausea or vomiting. 07/04/17   Lucila Maine C, DO  ondansetron (ZOFRAN-ODT) 4 MG disintegrating tablet Take 1 tablet (4 mg total) by mouth every 8 (eight) hours as needed for nausea. 93m ODT q4 hours prn nausea 07/21/17   CJola Schmidt MD  oxyCODONE (ROXICODONE) 5 MG immediate release tablet Take 1 tablet (5 mg total) by mouth every 4 (four) hours as needed for severe pain. 07/04/17   RSteve Rattler DO  pantoprazole (PROTONIX) 40 MG tablet Take 1 tablet (40 mg total) by mouth 2 (two) times daily before a meal. 12/01/16   Rice, CResa Miner MD  promethazine (PHENERGAN) 25 MG tablet Take 1 tablet (25 mg total) by mouth every 6 (six) hours as needed for nausea or vomiting. 07/04/17   RSteve Rattler DO  Respiratory Therapy Supplies MISC 1 each daily. CPAP MACHINE    [provider]  sevelamer carbonate (RENVELA) 800 MG tablet Take 3 tablets (2,400 mg total) by mouth 3 (three) times daily with meals. 04/22/17  [name], DO  traMADol (ULTRAM) 50 MG tablet Take 1 tablet (50 mg total) by mouth every 12 (twelve) hours as needed for moderate pain. 07/04/17   Steve Rattler, DO  warfarin (COUMADIN) 5 MG tablet Take 1.5 tablets (7.5 mg total) by mouth daily. Patient taking differently: Take 5 mg by mouth daily.  04/22/17   [name], DO    Discontinued Meds:  There are no discontinued medications.  Social History:  reports that he has quit smoking. His smoking use included cigarettes. He smoked 0.00 packs per day for 1.00 year. he has never used smokeless tobacco. He reports that he uses drugs. Drug: Marijuana. He reports that he does not drink alcohol.  Family History:   Family History  Problem Relation Age of Onset  . Hypertension Other     Blood pressure (!) 177/127, pulse 83, temperature 98.3 F (36.8 C), temperature source Axillary, resp. rate 18, SpO2 100 %. General appearance: alert, cooperative and appears stated age Head: Normocephalic, without obvious abnormality, atraumatic Eyes: negative Neck: no adenopathy, no carotid bruit, supple, symmetrical, trachea midline and thyroid not enlarged, symmetric, no tenderness/mass/nodules Back: symmetric, no curvature. ROM normal. No CVA tenderness. Resp: clear to auscultation bilaterally Chest wall: no tenderness Cardio: regular rate and rhythm GI: soft, non-tender; bowel sounds normal; no masses,  no organomegaly Extremities: extremities normal, atraumatic, no cyanosis or edema Pulses: 2+ and symmetric Left Cimino positive bruit but very pulsatile to the medial epicondyle Skin: Skin color, texture, turgor normal. No rashes or lesions Lymph nodes: Cervical, supraclavicular, and axillary nodes normal. Neurologic: Grossly normal       LIN, Hunt Oris, MD 07/21/2017, 7:27 PM

## 2017-07-21 NOTE — Progress Notes (Signed)
ANTICOAGULATION CONSULT NOTE - Initial Consult  Pharmacy Consult for Warfarin Indication: DVT/PE 02/2017  Allergies  Allergen Reactions  . Butalbital-Apap-Caffeine Shortness Of Breath, Swelling and Other (See Comments)    Swelling in throat  . Ferrlecit [Na Ferric Gluc Cplx In Sucrose] Shortness Of Breath, Swelling and Other (See Comments)    Swelling in throat, tolerates Venofor  . Minoxidil Shortness Of Breath  . Tylenol [Acetaminophen] Anaphylaxis and Swelling  . Darvocet [Propoxyphene N-Acetaminophen] Hives    Patient Measurements: Height: _0  (188 cm) Weight: 227 lb 8.2 oz (103.2 kg) IBW/kg (Calculated) : 82.2   Vital Signs: BP: 173/124 (12/31 2142) Pulse Rate: 83 (12/31 2142)  Labs: Recent Labs    07/21/17 0133 07/21/17 0320  HGB  --  10.2*  HCT  --  31.6*  PLT  --  138*  LABPROT 16.7*  --   INR 1.36  --   CREATININE 12.79*  --     Estimated Creatinine Clearance: 8.6 mL/min (A) (by C-G formula based on SCr of 12.79 mg/dL (H)).   Medical History: Past Medical History:  Diagnosis Date  . Anemia   . Anxiety   . Chronic combined systolic and diastolic CHF (congestive heart failure) (HCC)    a. EF 20-25% by echo in 08/2015 b. echo 10/2015: EF 35-40%, diffuse HK, severe LAE, moderate RAE, small pericardial effusion  . Complication of anesthesia    itching, sore throat  . Depression   . Dialysis patient ([place])   . DVT (deep venous thrombosis) ([place]) 02/2017  . ESRD (end stage renal disease) ([place])    due to HTN per patient, followed at [facility], s/p failed kidney transplant - dialysis Tue, Th, Sat  . Hyperkalemia 12/2015  . Hypertension   . Junctional rhythm    a. noted in 08/2015: hyperkalemic at that time  b. 12/2015: presented in junctional rhythm w/ K+ of 6.6. Resolved with improvement of K+ levels.  . Nonischemic cardiomyopathy ([place])    a. 08/2014: cath showing minimal CAD, but tortuous arteries noted.   . Personal history of DVT (deep vein thrombosis)/  PE 05/26/2016   In Oct 2015 had small subsemental LUL PE w/o DVT (LE dopplers neg) and was felt to be HD cath related, treated w coumadin.  IN May 2016 had small vein DVT (acute/subacute) in the R basilic/ brachial veins of the RUE, resumed on coumadin.  Had R sided HD cath at that time.    . Renal insufficiency   . Shortness of breath   . Type II diabetes mellitus (HCC)    No history per patient, but remains under history as A1c would not be accurate given on dialysis    Assessment: 51yom admitted with SOB.  Hx ESRD and DVT/PE in 8/18.  INR 1.3 claims N/V and not keeping meds down.  Discussion with MD no need for heparin bridge.   Restart warfarin.  CBC low stable  Goal of Therapy:  INR 2-3 Monitor platelets by anticoagulation protocol: Yes   Plan:  Warfarin 7.64m x1 Daily Protime    LBonnita NasutiPharm.D. CPP, BCPS Clinical Pharmacist [phone] [id number]/31/2018 9:58 PM

## 2017-07-21 NOTE — ED Notes (Signed)
PAGED FAMILY PRACTICE TO [place], [place]

## 2017-07-21 NOTE — ED Notes (Signed)
Pt ambulated in hallway with pulse ox. SPO2 dropped to as low as 81% with ambulating.

## 2017-07-21 NOTE — ED Triage Notes (Signed)
Pt BIB [facility] for Chest pain that began 1 hour pta. Pt also c/o of abdominal pain for 3 days with associated nausea and vomiting. Pt also states he has not slept in 3 days. Denied ETOH. 324 ASA given prior to arrival

## 2017-07-21 NOTE — ED Notes (Signed)
Pt understood dc material. NAD noted. Scripts given at [employer]. Pt requested a cup of hot chocolate. Pt was on RA without distress

## 2017-07-21 NOTE — ED Notes (Signed)
Pt went by the triage desk and stated he was going out into the lobby for a while

## 2017-07-21 NOTE — ED Notes (Signed)
Pt states he does not want to get in a gown at this time. No resp distress noted. Pt AOX4. Connected to O2 at 2L per request.

## 2017-07-21 NOTE — ED Notes (Signed)
Pt asking about wait time.

## 2017-07-21 NOTE — ED Notes (Signed)
RENAL DIET ORDERED

## 2017-07-21 NOTE — Discharge Summary (Signed)
[facility] Discharge Summary  Patient name: Frank [name] Medical record number: [id number] Date of birth: [birth year] Age: 51 y.o. Gender: male Date of Admission: 07/21/2017  Date of Discharge: 07/24/2016 Admitting Physician: Lind Covert, MD  Primary Care Provider: Lolita Patella, MD Consultants: Nephrology for HD  Indication for Hospitalization: Shortness of breath  Discharge Diagnoses/Problem List:  Accelerated hypertension ESRD Subtherapeutic INR Abdominal pain Chronic pancreatitis NICM HFrEF HLD DVT/PE Anxiety and depression Chronic pain syndrome Anemia of chronic disease GERD OSA Marijuana abuse  Disposition: Home  Discharge Condition: Stable, improved  Discharge Exam:  Vitals:   07/23/17 1700 07/23/17 1759  BP: (!) 146/99 (!) 150/98  Pulse: 70 68  Resp:  17  Temp:  97.8 F (36.6 C)  SpO2:  97%  GEN: Comfortable, resting in bed, NAD RESPIRATORY: clear to auscultation bilaterally with no wheezes, rhonchi or rales, good effort CV: RRR, no m/r/g, no peripheral edema GI: Soft, non-tender, non-distended, mildly tender to palpation diffusely SKIN: warm and dry, no rashes or lesions PSYCH: AAOx3, appropriate affect  Brief Hospital Course:  Frank [name] is a 51 y.o. male presenting with SOB, diffuse abdominal pain, accelerated HTN c/w fluid overload s/p incomplete HD. PMH is significant for ESRD, HTN, HFrEF, NICM, DVT/PE, anxiety and depression, chronic pain syndrome, anemia of chronic disease, GERD, chronic pancreatitis, HLD, OSA, marijuana abuse.  Patient is an established ESRD with [facility] who presents frequently to the ED for symptoms of fluid overload including abdominal pain, shortness of breath, orthopnea.  Patient presented following incomplete HD 3 days prior due to complications with his left arm fistula.  He was instructed to follow-up with IR for evaluation but could not make it to the appointment and  was instructed to call EMS and was brought to the ED.  Patient also endorsing nonadherence to medications for over 4 days and was found to have subtherapeutic INR of 1.36. He refused one dose of the coumadin and INR was subsequently low at 1.25. Coumadin was dosed at 7.25 mg at discharge and he was scheduled follow up for further management on the day of discharge.  Upon arrival, patient was noted to have distended abdomen with moderate tenderness on palpation and was complaining of shortness of breath.  Patient was not hypoxic need for oxygen therapy.  He denied symptoms of ACS including chest pain.  Electrolytes were relatively normal with a normal potassium and EKG NSR.  Nephrology was consulted and evaluated patient. HD was performed with improvement in blood pressure and dyspnea.   IR was consulted for complications related to left arm fistula. Because of holiday scheduling and reduced staffing, IR proposed seeing the patient in outpatient setting. HD was completed without issue with the fistula while patient was admitted. Nephrology agreed to help coordinate patient's follow up with IR outpatient.  At the time of discharge, patient refused because he said he did not have a ride. He was held overnight and was discharged early in the morning.  Issues for Follow Up:  1. Patient is to follow up with IR as an outpatient for management of his fistula. 2. There were concerns about discharging patient with Warfarin due to history of poor compliance with follow up. Patient was given 2 pills of coumadin to get him to his follow up appointment. Pharmacy appointment was scheduled for the day of discharge to establish care and continue management of coumadin. Additional follow up with a physician was also prescribed so patient may  establish care.  3. 7.5 mg coumadin were prescribed, 2 tablets at discharge. Patient had been subtherapeutic during hospital stay and further management of dosing will be important at  follow up. 4. Hydralazine was not continued at discharge. Amlodipine, coreg, clonidine, imdur were continued at discharge. Please restart hydralazine as needed at follow up.  Significant Procedures: Hemodialysis  Significant Labs and Imaging:  Recent Labs  Lab 07/21/17 0320 07/21/17 2318 07/22/17 0545  WBC 4.8 4.9 4.4  HGB 10.2* 10.3* 9.6*  HCT 31.6* 31.2* 29.4*  PLT 138* 124* 139*   Recent Labs  Lab 07/18/17 0803 07/21/17 0133 07/21/17 2318 07/22/17 0545 07/23/17 1803  NA 137 134* 136 135 134*  K 5.1 5.1 5.9* 6.0* 4.4  CL 97* 95* 95* 94* 97*  CO2  --  _0 GLUCOSE 77 97 91 85 115*  BUN 55* 75* 95* 99* 54*  CREATININE 12.40* 12.79* 14.21* 15.00* 10.19*  CALCIUM  --  9.4 9.6 9.1 8.7*  PHOS  --   --  7.4*  --  5.5*  ALKPHOS  --  125  --  115  --   AST  --  80*  --  139*  --   ALT  --  69*  --  133*  --   ALBUMIN  --  3.1* 3.1* 2.9* 2.7*   Lipase: 26 (WNL) INR: 1.36 Troponin: 0.06  DG CHEST PORT 1 VIEW (07/21/2017):  CHF with mild interstitial edema not greatly changed from earlier today nor from the June 29, 2017 chest x-ray. Thoracic aortic atherosclerosis.  DG ABD ACUTE W/CHEST (07/21/2017):  1. Stable severe cardiomegaly and interstitial pulmonary edema. 2. Normal bowel gas pattern.  Results/Tests Pending at Time of Discharge: None  Discharge Medications:  Allergies as of 07/24/2017      Reactions   Butalbital-apap-caffeine Shortness Of Breath, Swelling, Other (See Comments)   Swelling in throat   Ferrlecit [na Ferric Gluc Cplx In Sucrose] Shortness Of Breath, Swelling, Other (See Comments)   Swelling in throat, tolerates Venofor   Minoxidil Shortness Of Breath   Tylenol [acetaminophen] Anaphylaxis, Swelling   Darvocet [propoxyphene N-acetaminophen] Hives      Medication List    STOP taking these medications   enoxaparin 150 MG/ML injection Commonly known as:  LOVENOX   gabapentin 100 MG capsule Commonly known as:  NEURONTIN    hydrALAZINE 100 MG tablet Commonly known as:  APRESOLINE   oxyCODONE 5 MG immediate release tablet Commonly known as:  ROXICODONE   promethazine 25 MG tablet Commonly known as:  PHENERGAN   traMADol 50 MG tablet Commonly known as:  ULTRAM     TAKE these medications   amitriptyline 10 MG tablet Commonly known as:  ELAVIL Take 1 tablet (10 mg total) by mouth at bedtime.   amLODipine 10 MG tablet Commonly known as:  NORVASC Take 1 tablet (10 mg total) by mouth at bedtime.   carvedilol 25 MG tablet Commonly known as:  COREG Take 1 tablet (25 mg total) by mouth 2 (two) times daily with a meal.   cloNIDine 0.3 mg/24hr patch Commonly known as:  CATAPRES - Dosed in mg/24 hr Place 0.3 mg onto the skin every Wednesday.   dicyclomine 20 MG tablet Commonly known as:  BENTYL Take 20 mg by mouth every 6 (six) hours.   docusate sodium 100 MG capsule Commonly known as:  COLACE Take 1 capsule (100 mg total) by mouth 2 (two) times daily.   doxazosin 4 MG  tablet Commonly known as:  CARDURA Take 4 mg by mouth at bedtime.   isosorbide mononitrate 60 MG 24 hr tablet Commonly known as:  IMDUR Take 1 tablet (60 mg total) by mouth daily.   metoCLOPramide 5 MG tablet Commonly known as:  REGLAN Take 1 tablet (5 mg total) by mouth every 8 (eight) hours as needed for nausea or vomiting.   ondansetron 4 MG disintegrating tablet Commonly known as:  ZOFRAN-ODT Take 1 tablet (4 mg total) by mouth every 8 (eight) hours as needed for nausea. 50m ODT q4 hours prn nausea   pantoprazole 40 MG tablet Commonly known as:  PROTONIX Take 1 tablet (40 mg total) by mouth 2 (two) times daily before a meal.   Respiratory Therapy Supplies Misc 1 each daily. CPAP MACHINE   sevelamer carbonate 800 MG tablet Commonly known as:  RENVELA Take 3 tablets (2,400 mg total) by mouth 3 (three) times daily with meals.   warfarin 7.5 MG tablet Commonly known as:  COUMADIN Take 1 tablet (7.5 mg total) by  mouth daily. Today and tomorrow. See coumadin pharmacist for further management. What changed:    medication strength  additional instructions       Discharge Instructions: Please refer to Patient Instructions section of EMR for full details.  Patient was counseled important signs and symptoms that should prompt return to medical care, changes in medications, dietary instructions, activity restrictions, and follow up appointments.   Follow-Up Appointments: Follow-up Information    [facility] on 07/30/2017.   Why:  Please arrive 15 minutes early for your 1:30 PM appointment. Contact information: [phone]       [facility] Follow up on 07/24/2017.   Why:  Please arrive 15 minutes before your 1:30 PM Pharmacy appointment for coumadin management. Contact information: [id number] [phone]         FEverrett Coombe MD 07/24/2017, 8:34 AM PGY-2, [place]

## 2017-07-21 NOTE — ED Triage Notes (Signed)
Pt presents for evaluation of sob and vascular access problem. States d/c this AM, couldn't get dialysis because graft isnt working properly and hurts. Pt speaking in complete sentences in triage, NAD.
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
## 2017-07-21 NOTE — ED Notes (Signed)
Pt back in bed in B hallway

## 2017-07-22 DIAGNOSIS — R791 Abnormal coagulation profile: Secondary | ICD-10-CM | POA: Diagnosis not present

## 2017-07-22 DIAGNOSIS — Z9119 Patient's noncompliance with other medical treatment and regimen: Secondary | ICD-10-CM | POA: Diagnosis not present

## 2017-07-22 DIAGNOSIS — F419 Anxiety disorder, unspecified: Secondary | ICD-10-CM | POA: Diagnosis present

## 2017-07-22 DIAGNOSIS — Z888 Allergy status to other drugs, medicaments and biological substances status: Secondary | ICD-10-CM | POA: Diagnosis not present

## 2017-07-22 DIAGNOSIS — J810 Acute pulmonary edema: Secondary | ICD-10-CM | POA: Diagnosis not present

## 2017-07-22 DIAGNOSIS — I428 Other cardiomyopathies: Secondary | ICD-10-CM | POA: Diagnosis present

## 2017-07-22 DIAGNOSIS — I5042 Chronic combined systolic (congestive) and diastolic (congestive) heart failure: Secondary | ICD-10-CM | POA: Diagnosis present

## 2017-07-22 DIAGNOSIS — K219 Gastro-esophageal reflux disease without esophagitis: Secondary | ICD-10-CM | POA: Diagnosis present

## 2017-07-22 DIAGNOSIS — I120 Hypertensive chronic kidney disease with stage 5 chronic kidney disease or end stage renal disease: Secondary | ICD-10-CM | POA: Diagnosis not present

## 2017-07-22 DIAGNOSIS — G4733 Obstructive sleep apnea (adult) (pediatric): Secondary | ICD-10-CM | POA: Diagnosis present

## 2017-07-22 DIAGNOSIS — N186 End stage renal disease: Secondary | ICD-10-CM

## 2017-07-22 DIAGNOSIS — N2581 Secondary hyperparathyroidism of renal origin: Secondary | ICD-10-CM | POA: Diagnosis not present

## 2017-07-22 DIAGNOSIS — R0602 Shortness of breath: Secondary | ICD-10-CM

## 2017-07-22 DIAGNOSIS — I700 Atherosclerosis of aorta: Secondary | ICD-10-CM | POA: Diagnosis present

## 2017-07-22 DIAGNOSIS — Z86718 Personal history of other venous thrombosis and embolism: Secondary | ICD-10-CM | POA: Diagnosis not present

## 2017-07-22 DIAGNOSIS — F1210 Cannabis abuse, uncomplicated: Secondary | ICD-10-CM | POA: Diagnosis present

## 2017-07-22 DIAGNOSIS — Z87891 Personal history of nicotine dependence: Secondary | ICD-10-CM | POA: Diagnosis not present

## 2017-07-22 DIAGNOSIS — R0902 Hypoxemia: Secondary | ICD-10-CM

## 2017-07-22 DIAGNOSIS — E875 Hyperkalemia: Secondary | ICD-10-CM | POA: Diagnosis present

## 2017-07-22 DIAGNOSIS — D631 Anemia in chronic kidney disease: Secondary | ICD-10-CM | POA: Diagnosis not present

## 2017-07-22 DIAGNOSIS — Z992 Dependence on renal dialysis: Secondary | ICD-10-CM | POA: Diagnosis not present

## 2017-07-22 DIAGNOSIS — M898X9 Other specified disorders of bone, unspecified site: Secondary | ICD-10-CM | POA: Diagnosis present

## 2017-07-22 DIAGNOSIS — G894 Chronic pain syndrome: Secondary | ICD-10-CM | POA: Diagnosis present

## 2017-07-22 DIAGNOSIS — I132 Hypertensive heart and chronic kidney disease with heart failure and with stage 5 chronic kidney disease, or end stage renal disease: Secondary | ICD-10-CM | POA: Diagnosis present

## 2017-07-22 DIAGNOSIS — E1122 Type 2 diabetes mellitus with diabetic chronic kidney disease: Secondary | ICD-10-CM | POA: Diagnosis present

## 2017-07-22 DIAGNOSIS — Z885 Allergy status to narcotic agent status: Secondary | ICD-10-CM | POA: Diagnosis not present

## 2017-07-22 DIAGNOSIS — Z7901 Long term (current) use of anticoagulants: Secondary | ICD-10-CM | POA: Diagnosis not present

## 2017-07-22 DIAGNOSIS — F329 Major depressive disorder, single episode, unspecified: Secondary | ICD-10-CM | POA: Diagnosis present

## 2017-07-22 DIAGNOSIS — E785 Hyperlipidemia, unspecified: Secondary | ICD-10-CM | POA: Diagnosis present

## 2017-07-22 LAB — COMPREHENSIVE METABOLIC PANEL
ALK PHOS: 115 U/L (ref 38–126)
ALT: 133 U/L — AB (ref 17–63)
ANION GAP: 16 — AB (ref 5–15)
AST: 139 U/L — ABNORMAL HIGH (ref 15–41)
Albumin: 2.9 g/dL — ABNORMAL LOW (ref 3.5–5.0)
BUN: 99 mg/dL — ABNORMAL HIGH (ref 6–20)
CO2: 25 mmol/L (ref 22–32)
CREATININE: 15 mg/dL — AB (ref 0.61–1.24)
Calcium: 9.1 mg/dL (ref 8.9–10.3)
Chloride: 94 mmol/L — ABNORMAL LOW (ref 101–111)
GFR calc non Af Amer: 3 mL/min — ABNORMAL LOW (ref >60)
GFR, EST AFRICAN AMERICAN: 4 mL/min — AB (ref >60)
Glucose, Bld: 85 mg/dL (ref 65–99)
Potassium: 6 mmol/L — ABNORMAL HIGH (ref 3.5–5.1)
SODIUM: 135 mmol/L (ref 135–145)
TOTAL PROTEIN: 6.9 g/dL (ref 6.5–8.1)
Total Bilirubin: 0.9 mg/dL (ref 0.3–1.2)

## 2017-07-22 LAB — CBC
HCT: 29.4 % — ABNORMAL LOW (ref 39.0–52.0)
HEMOGLOBIN: 9.6 g/dL — AB (ref 13.0–17.0)
MCH: 28.2 pg (ref 26.0–34.0)
MCHC: 32.7 g/dL (ref 30.0–36.0)
MCV: 86.5 fL (ref 78.0–100.0)
Platelets: 139 10*3/uL — ABNORMAL LOW (ref 150–400)
RBC: 3.4 MIL/uL — AB (ref 4.22–5.81)
RDW: 17 % — ABNORMAL HIGH (ref 11.5–15.5)
WBC: 4.4 10*3/uL (ref 4.0–10.5)

## 2017-07-22 LAB — PROTIME-INR
INR: 1.41
Prothrombin Time: 17.1 seconds — ABNORMAL HIGH (ref 11.4–15.2)

## 2017-07-22 MED ORDER — WARFARIN SODIUM 7.5 MG PO TABS
7.5000 mg | ORAL_TABLET | Freq: Once | ORAL | Status: AC
  Administered 2017-07-22: 7.5 mg via ORAL
  Filled 2017-07-22 (×2): qty 1

## 2017-07-22 MED ORDER — SEVELAMER CARBONATE 800 MG PO TABS
2400.0000 mg | ORAL_TABLET | Freq: Three times a day (TID) | ORAL | Status: DC
  Administered 2017-07-23 – 2017-07-24 (×4): 2400 mg via ORAL
  Filled 2017-07-22 (×4): qty 3

## 2017-07-22 NOTE — Progress Notes (Signed)
New Admission Note:  Arrival Method: Via stretcher from ED. Mental Orientation: Alert & Oriented x4 Telemetry: CCMD verified Assessment: Completed Skin: Refer to flowsheet IV: Right Hand Pain: 7/10 Tubes: Safety Measures: Safety Fall Prevention Plan discussed with patient. Admission: Completed 5 [place] Orientation: Patient has been orientated to the room, unit and the staff.  Orders have been reviewed and implemented. Will continue to monitor the patient. Call light has been placed within reach. Vassie Moselle, RN  Phone Number: [phone]

## 2017-07-22 NOTE — Procedures (Signed)
I was present at this dialysis session. I have reviewed the session itself and made appropriate changes.   Filed Weights   07/21/17 2142 07/22/17 0234 07/22/17 0656  Weight: 103.2 kg (227 lb 8.2 oz) 103.2 kg (227 lb 8.2 oz) 82.7 kg (182 lb 5.1 oz)    Recent Labs  Lab 07/21/17 2318 07/22/17 0545  NA 136 135  K 5.9* 6.0*  CL 95* 94*  CO2 23 25  GLUCOSE 91 85  BUN 95* 99*  CREATININE 14.21* 15.00*  CALCIUM 9.6 9.1  PHOS 7.4*  --     Recent Labs  Lab 07/17/17 0432  07/21/17 0320 07/21/17 2318 07/22/17 0545  WBC 4.5  --  4.8 4.9 4.4  NEUTROABS 3.0  --   --   --   --   HGB 10.6*   < > 10.2* 10.3* 9.6*  HCT 32.4*   < > 31.6* 31.2* 29.4*  MCV 88.5  --  87.3 86.7 86.5  PLT 129*  --  138* 124* 139*   < > = values in this interval not displayed.    Scheduled Meds: . amitriptyline  10 mg Oral QHS  . amLODipine  10 mg Oral QHS  . carvedilol  25 mg Oral BID WC  . heparin  2,000 Units Dialysis Once in dialysis  . pantoprazole  40 mg Oral BID AC  . sodium chloride flush  3 mL Intravenous Q12H  . warfarin  7.5 mg Oral Once  . Warfarin - Pharmacist Dosing Inpatient   Does not apply q1800   Continuous Infusions: . sodium chloride    . sodium chloride    . sodium chloride     PRN Meds:.sodium chloride, sodium chloride, sodium chloride, alteplase, heparin, hydrALAZINE, lidocaine (PF), lidocaine-prilocaine, ondansetron **OR** ondansetron (ZOFRAN) IV, pentafluoroprop-tetrafluoroeth, polyethylene glycol, sodium chloride flush, traMADol    Donetta Potts,  MD 07/22/2017, 8:55 AM
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
## 2017-07-22 NOTE — Progress Notes (Signed)
Pt refusing recheck of vital signs at this time.

## 2017-07-22 NOTE — Progress Notes (Signed)
ANTICOAGULATION CONSULT NOTE - Follow Up Consult  Pharmacy Consult for Warfarin Indication: DVT/PE 02/2017  Allergies  Allergen Reactions  . Butalbital-Apap-Caffeine Shortness Of Breath, Swelling and Other (See Comments)    Swelling in throat  . Ferrlecit [Na Ferric Gluc Cplx In Sucrose] Shortness Of Breath, Swelling and Other (See Comments)    Swelling in throat, tolerates Venofor  . Minoxidil Shortness Of Breath  . Tylenol [Acetaminophen] Anaphylaxis and Swelling  . Darvocet [Propoxyphene N-Acetaminophen] Hives    Patient Measurements: Height: _0  (188 cm) Weight: 182 lb 5.1 oz (82.7 kg)(standing weight) IBW/kg (Calculated) : 82.2   Vital Signs: Temp: 97.6 F (36.4 C) (01/01 0656) Temp Source: Oral (01/01 0656) BP: 160/98 (01/01 0830) Pulse Rate: 69 (01/01 0830)  Labs: Recent Labs    07/21/17 0133  07/21/17 0320 07/21/17 2318 07/22/17 0545  HGB  --    < > 10.2* 10.3* 9.6*  HCT  --   --  31.6* 31.2* 29.4*  PLT  --   --  138* 124* 139*  LABPROT 16.7*  --   --   --  17.1*  INR 1.36  --   --   --  1.41  CREATININE 12.79*  --   --  14.21* 15.00*   < > = values in this interval not displayed.    Estimated Creatinine Clearance: 6.6 mL/min (A) (by C-G formula based on SCr of 15 mg/dL (H)).   Medical History: Past Medical History:  Diagnosis Date  . Anemia   . Anxiety   . Chronic combined systolic and diastolic CHF (congestive heart failure) (HCC)    a. EF 20-25% by echo in 08/2015 b. echo 10/2015: EF 35-40%, diffuse HK, severe LAE, moderate RAE, small pericardial effusion  . Complication of anesthesia    itching, sore throat  . Depression   . Dialysis patient ([place])   . DVT (deep venous thrombosis) ([place]) 02/2017  . ESRD (end stage renal disease) ([place])    due to HTN per patient, followed at [facility], s/p failed kidney transplant - dialysis Tue, Th, Sat  . Hyperkalemia 12/2015  . Hypertension   . Junctional rhythm    a. noted in 08/2015: hyperkalemic at that  time  b. 12/2015: presented in junctional rhythm w/ K+ of 6.6. Resolved with improvement of K+ levels.  . Nonischemic cardiomyopathy ([place])    a. 08/2014: cath showing minimal CAD, but tortuous arteries noted.   . Personal history of DVT (deep vein thrombosis)/ PE 05/26/2016   In Oct 2015 had small subsemental LUL PE w/o DVT (LE dopplers neg) and was felt to be HD cath related, treated w coumadin.  IN May 2016 had small vein DVT (acute/subacute) in the R basilic/ brachial veins of the RUE, resumed on coumadin.  Had R sided HD cath at that time.    . Renal insufficiency   . Shortness of breath   . Type II diabetes mellitus (HCC)    No history per patient, but remains under history as A1c would not be accurate given on dialysis    Assessment: 52 yo M admitted with SOB likely related to incomplete fluid removal with HD.  Hx ESRD (MWF) and DVT/PE in 8/18.    INR 1.36 on admission.  Pt claims N/V and not keeping meds down.  Pt also endorses noncompliance.  Discussion with MD no need for heparin bridge.  Restart warfarin.    Of note, pt/family refused warfarin dose ordered for 12/31 PM.  Goal of Therapy:  INR 2-3 Monitor platelets by anticoagulation protocol: Yes   Plan:  Warfarin 7.52m x1 Daily INR  [employer] Pharm.D., BCPS Clinical Pharmacist Clinical phone for 07/22/2017 from 8:30-4:00 is [phone] After 4pm, please call [facility] [phone]) for assistance. 07/22/2017 9:42 AM

## 2017-07-22 NOTE — Progress Notes (Signed)
  Request seen for dialysis shunt study for prolonged bleeding.  IR is not staffed today since it is a holiday.  Recommend discharge patient after HD and arrange as an outpatient.  This was discussed with the dialysis charge nurse and she will make patient and Nephrologist aware.  Draken Farrior S Beckam Abdulaziz PA-C 07/22/2017 9:37 AM

## 2017-07-23 ENCOUNTER — Telehealth: Payer: Self-pay | Admitting: Pharmacist

## 2017-07-23 DIAGNOSIS — Z992 Dependence on renal dialysis: Secondary | ICD-10-CM

## 2017-07-23 DIAGNOSIS — R791 Abnormal coagulation profile: Secondary | ICD-10-CM

## 2017-07-23 LAB — PROTIME-INR
INR: 1.25
Prothrombin Time: 15.6 seconds — ABNORMAL HIGH (ref 11.4–15.2)

## 2017-07-23 LAB — RENAL FUNCTION PANEL
Albumin: 2.7 g/dL — ABNORMAL LOW (ref 3.5–5.0)
Anion gap: 11 (ref 5–15)
BUN: 54 mg/dL — ABNORMAL HIGH (ref 6–20)
CHLORIDE: 97 mmol/L — AB (ref 101–111)
CO2: 26 mmol/L (ref 22–32)
Calcium: 8.7 mg/dL — ABNORMAL LOW (ref 8.9–10.3)
Creatinine, Ser: 10.19 mg/dL — ABNORMAL HIGH (ref 0.61–1.24)
GFR, EST AFRICAN AMERICAN: 6 mL/min — AB (ref >60)
GFR, EST NON AFRICAN AMERICAN: 5 mL/min — AB (ref >60)
Glucose, Bld: 115 mg/dL — ABNORMAL HIGH (ref 65–99)
POTASSIUM: 4.4 mmol/L (ref 3.5–5.1)
Phosphorus: 5.5 mg/dL — ABNORMAL HIGH (ref 2.5–4.6)
Sodium: 134 mmol/L — ABNORMAL LOW (ref 135–145)

## 2017-07-23 MED ORDER — WARFARIN SODIUM 7.5 MG PO TABS: 7.5000 mg | ORAL_TABLET | Freq: Every day | ORAL | 0 refills | Status: DC

## 2017-07-23 MED ORDER — WARFARIN SODIUM 7.5 MG PO TABS
7.5000 mg | ORAL_TABLET | Freq: Once | ORAL | Status: AC
  Administered 2017-07-23: 7.5 mg via ORAL
  Filled 2017-07-23: qty 1
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
## 2017-07-23 NOTE — Telephone Encounter (Signed)
Received phone call from Dr. Mingo Amber who is the attending for the [facility], which patient is currently admitted under. Patient needs follow up for warfarin management and he wanted to know if patient could come here for that. I told him that patient could come here, he would need to re-establish with Dr. Doreene Burke and that he could have a hospital follow up for warfarin management with me. He will have a resident call to set those appointments up for the patient.   Of note, patient has not been seen here recently, appears to be trying to get his primary care through the emergency room. Needs lifelong warfarin. On dialysis so NOACs not preferred at this time.

## 2017-07-23 NOTE — Progress Notes (Signed)
Pt returned from dialysis. Orders to discharge pt, however pt states that he does not have a ride home and is requesting that the D/C be put off until in the morning. Teaching service paged and stated pt could wait to be D/C in the AM. Pt verbalized understanding.

## 2017-07-23 NOTE — Progress Notes (Signed)
Spoke with Veneta Penton re: plan for fistulogram.  Patient will need fistulogram as outpatient, but access currently open/useable  With no bleeding issues after HD yesterday. Ok to cancel order for inpatient procedure.  Patient will plan to reschedule as an outpatient with vascular surgery.   Please re-consult if needed.   Brynda Greathouse, MS RD PA-C 10:47 AM

## 2017-07-23 NOTE — Progress Notes (Signed)
Patient ID: Frank [name], male   DOB: 06/21/1966, 52 y.o.   MRN: [id number]   Spoke to RN regarding shuntogram of dialysis access  See Kateri Mc note from yesterday Was recommended as OP procedure Pt still in hospital  RN states she feels MD wants procedure while inpt. Will place pt on IR list for procedure  May not be done even today secondary tremendously busy schedule in IR Can easily be scheduled as OP  Will call for pt when can---today or tomorrow -- unless DC to home Call [phone] for OP time and date if discharging today

## 2017-07-23 NOTE — Progress Notes (Signed)
ANTICOAGULATION CONSULT NOTE - Follow Up Consult  Pharmacy Consult for Warfarin Indication: DVT/PE 02/2017  Allergies  Allergen Reactions  . Butalbital-Apap-Caffeine Shortness Of Breath, Swelling and Other (See Comments)    Swelling in throat  . Ferrlecit [Na Ferric Gluc Cplx In Sucrose] Shortness Of Breath, Swelling and Other (See Comments)    Swelling in throat, tolerates Venofor  . Minoxidil Shortness Of Breath  . Tylenol [Acetaminophen] Anaphylaxis and Swelling  . Darvocet [Propoxyphene N-Acetaminophen] Hives    Patient Measurements: Height: _0  (188 cm) Weight: 174 lb 6.1 oz (79.1 kg) IBW/kg (Calculated) : 82.2  Vital Signs: Temp: 97.5 F (36.4 C) (01/02 0944) Temp Source: Oral (01/02 0944) BP: 146/98 (01/02 0944) Pulse Rate: 68 (01/02 0944)  Labs: Recent Labs    07/21/17 0133  07/21/17 0320 07/21/17 2318 07/22/17 0545 07/23/17 0629  HGB  --    < > 10.2* 10.3* 9.6*  --   HCT  --   --  31.6* 31.2* 29.4*  --   PLT  --   --  138* 124* 139*  --   LABPROT 16.7*  --   --   --  17.1* 15.6*  INR 1.36  --   --   --  1.41 1.25  CREATININE 12.79*  --   --  14.21* 15.00*  --    < > = values in this interval not displayed.    Estimated Creatinine Clearance: 6.4 mL/min (A) (by C-G formula based on SCr of 15 mg/dL (H)).   Medical History: Past Medical History:  Diagnosis Date  . Anemia   . Anxiety   . Chronic combined systolic and diastolic CHF (congestive heart failure) (HCC)    a. EF 20-25% by echo in 08/2015 b. echo 10/2015: EF 35-40%, diffuse HK, severe LAE, moderate RAE, small pericardial effusion  . Complication of anesthesia    itching, sore throat  . Depression   . Dialysis patient ([place])   . DVT (deep venous thrombosis) ([place]) 02/2017  . ESRD (end stage renal disease) ([place])    due to HTN per patient, followed at [facility], s/p failed kidney transplant - dialysis Tue, Th, Sat  . Hyperkalemia 12/2015  . Hypertension   . Junctional rhythm    a. noted in  08/2015: hyperkalemic at that time  b. 12/2015: presented in junctional rhythm w/ K+ of 6.6. Resolved with improvement of K+ levels.  . Nonischemic cardiomyopathy ([place])    a. 08/2014: cath showing minimal CAD, but tortuous arteries noted.   . Personal history of DVT (deep vein thrombosis)/ PE 05/26/2016   In Oct 2015 had small subsemental LUL PE w/o DVT (LE dopplers neg) and was felt to be HD cath related, treated w coumadin.  IN May 2016 had small vein DVT (acute/subacute) in the R basilic/ brachial veins of the RUE, resumed on coumadin.  Had R sided HD cath at that time.    . Renal insufficiency   . Shortness of breath   . Type II diabetes mellitus (HCC)    No history per patient, but remains under history as A1c would not be accurate given on dialysis    Assessment: 52 yo M admitted with SOB likely related to incomplete fluid removal with HD.  Hx ESRD (MWF) and DVT/PE in 8/18.    INR 1.36 on admission.  Pt claims N/V and not keeping meds down.  Pt also endorses noncompliance.  Discussion with MD no need for heparin bridge.  Restart warfarin.    Of  note, pt/family refused warfarin dose ordered for 12/31 PM.  Spoke with pt 1/1 now amenable to warfarin dosing.  Goal of Therapy:  INR 2-3 Monitor platelets by anticoagulation protocol: Yes   Plan:  Repeat Warfarin 7.23m x1 Daily INR  [employer] Pharm.D., BCPS Clinical Pharmacist Clinical phone for 07/23/2017 from 8:30-4:00 is [phone] After 4pm, please call Main [facility] [phone]) for assistance. 07/23/2017 10:32 AM

## 2017-07-23 NOTE — Progress Notes (Signed)
[facility] Progress Note   Subjective:  Seen in room. No CP or dyspnea. S/p HD yesterday. Vasc surgery evaluated patient for fistulogram, but plan was for this to be done later as outpatient. He has eaten this morning.  Objective Vitals:   07/22/17 1218 07/22/17 2235 07/22/17 2248 07/23/17 0537  BP: (!) 152/98 (!) 148/91  (!) 130/93  Pulse: 76 64 67 67  Resp: _0 Temp: 98.6 F (37 C) (!) 97.5 F (36.4 C)  (!) 97.4 F (36.3 C)  TempSrc: Oral Oral  Oral  SpO2: 97% 98% 98% 98%  Weight:      Height:       Physical Exam General: Well appearing, NAD Heart: RRR; no murmur Lungs: CTAB Extremities: No LE edema Dialysis Access: L forearm AVF + thrill, + bruit with intermittent whistle  Additional Objective Labs: Basic Metabolic Panel: Recent Labs  Lab 07/21/17 0133 07/21/17 2318 07/22/17 0545  NA 134* 136 135  K 5.1 5.9* 6.0*  CL 95* 95* 94*  CO2 _1 GLUCOSE 97 91 85  BUN 75* 95* 99*  CREATININE 12.79* 14.21* 15.00*  CALCIUM 9.4 9.6 9.1  PHOS  --  7.4*  --    Liver Function Tests: Recent Labs  Lab 07/17/17 0432 07/21/17 0133 07/21/17 2318 07/22/17 0545  AST 27 80*  --  139*  ALT 21 69*  --  133*  ALKPHOS 108 125  --  115  BILITOT 0.8 1.0  --  0.9  PROT 7.9 7.5  --  6.9  ALBUMIN 3.4* 3.1* 3.1* 2.9*   Recent Labs  Lab 07/17/17 0432 07/21/17 0133  LIPASE 30 26   CBC: Recent Labs  Lab 07/17/17 0432  07/21/17 0320 07/21/17 2318 07/22/17 0545  WBC 4.5  --  4.8 4.9 4.4  NEUTROABS 3.0  --   --   --   --   HGB 10.6*   < > 10.2* 10.3* 9.6*  HCT 32.4*   < > 31.6* 31.2* 29.4*  MCV 88.5  --  87.3 86.7 86.5  PLT 129*  --  138* 124* 139*   < > = values in this interval not displayed.   Studies/Results: Dg Chest Portable 1 View  Result Date: 07/21/2017 CLINICAL DATA:  Shortness of breath. History of CHF, dialysis dependent renal failure, nonischemic cardiomyopathy. EXAM: PORTABLE CHEST 1 VIEW COMPARISON:  Chest x-ray of  July 21, 2017 included with a acute abdominal series. PA and lateral chest x-ray of June 29, 2017. FINDINGS: The lungs are well-expanded. The interstitial markings are increased. The pulmonary vascularity is engorged. The cardiac silhouette is enlarged. There is calcification in the wall of the aortic arch. The mediastinum is normal in width. There is no pleural effusion. The bony thorax is unremarkable. IMPRESSION: CHF with mild interstitial edema not greatly changed from earlier today nor from the June 29, 2017 chest x-ray. Thoracic aortic atherosclerosis. Electronically Signed   By: David  Martinique M.D.   On: 07/21/2017 17:02   Medications: . sodium chloride     . amitriptyline  10 mg Oral QHS  . amLODipine  10 mg Oral QHS  . carvedilol  25 mg Oral BID WC  . pantoprazole  40 mg Oral BID AC  . sevelamer carbonate  2,400 mg Oral TID WC  . sodium chloride flush  3 mL Intravenous Q12H  . Warfarin - Pharmacist Dosing Inpatient   Does not apply q1800   Dialysis Orders: MWF at [facility]  4hr, BFR 400, DFR 800, EDW 81kg, 2K/2.25Ca, L AVF, heparin 2000 bolus - Hectoral 63mg IV q HD + Sensipar 1873mPO qd (home) - Mircera 15038mIV q 2 weeks (last 12/28)  Assessment/Plan: 1. Abdominal pain/nausea: Chronic/recurrent issue, no alarm symptoms. Improving. 2. ESRD: S/p HD yesterday and will dialyze again today per MWF sched since still here. 3. HTN/volume: BP still slightly high, ?accuracy of weights. 2L UF goal today, and reduce EDW as tolerated. 4. Anemia: Hgb 9.6, not due for ESA yet. 5. Secondary hyperparathyroidism: Back on Renvela, resume VDRA/sensipar. 6. Hx DVT/PE: On warfarin (missing doses), per primary. 7. Dialysis AVF/prolonged bleeding: Will need fistulogram as outpatient, open/useable today. No bleeding issues after HD yesterday.   KatVeneta PentonA-C 07/23/2017, 9:36 AM  [facility] Pager: [phone] have seen and examined this patient and agree with plan  and assessment in the above note with renal recommendations/intervention highlighted.  Will continue to challenge edw due to HTN.  Will schedule for outpatient fistulogram.  Stable for discharge after HD today.  JosBroadus JohnColadonato,MD 07/23/2017 3:15 PM

## 2017-07-24 ENCOUNTER — Telehealth: Payer: Self-pay | Admitting: Pharmacist

## 2017-07-24 ENCOUNTER — Encounter: Payer: Medicare Other | Admitting: Pharmacist

## 2017-07-24 MED ORDER — AMITRIPTYLINE HCL 10 MG PO TABS: 10.0000 mg | ORAL_TABLET | Freq: Every day | ORAL | 0 refills | Status: DC

## 2017-07-24 NOTE — Progress Notes (Deleted)
    Pharmacy Anticoagulation Clinic  Subjective: Patient presents today for INR monitoring. Anticoagulation indication is DVT/PE. Of note, patient is on dialysis  Current dose of warfarin: 7.5 mg daily only 1 dose in the last few days  Adherence to warfarin: denies consistent adherence to warfarin Signs/symptoms of bleeding:  Recent changes in diet: Recent changes in medications: Upcoming procedures that may impact anticoagulation: to follow up with IR as outpatient for fistula management   Objective: Today's INR =   Lab Results  Component Value Date   INR 1.25 07/23/2017   INR 1.41 07/22/2017   INR 1.36 07/21/2017     Assessment and Plan: Anticoagulation: Patient is Subtherapeutic based on patient's INR of *** and patient's INR goal of 2-3. Will {Add/stop/incr/decr:18779} current warfarin dose: ***  Patient verbalized understanding and was provided with written instructions. Next INR check planned for 07/30/17 (has visit scheduled with Dr. Doreene Burke)

## 2017-07-24 NOTE — Progress Notes (Signed)
Frank [name] to be D/C'd Home per MD order.  Discussed prescriptions and follow up appointments with the patient. Prescriptions given to patient, medication list explained in detail. Pt verbalized understanding.  Allergies as of 07/24/2017      Reactions   Butalbital-apap-caffeine Shortness Of Breath, Swelling, Other (See Comments)   Swelling in throat   Ferrlecit [na Ferric Gluc Cplx In Sucrose] Shortness Of Breath, Swelling, Other (See Comments)   Swelling in throat, tolerates Venofor   Minoxidil Shortness Of Breath   Tylenol [acetaminophen] Anaphylaxis, Swelling   Darvocet [propoxyphene N-acetaminophen] Hives      Medication List    STOP taking these medications   enoxaparin 150 MG/ML injection Commonly known as:  LOVENOX   gabapentin 100 MG capsule Commonly known as:  NEURONTIN   hydrALAZINE 100 MG tablet Commonly known as:  APRESOLINE   oxyCODONE 5 MG immediate release tablet Commonly known as:  ROXICODONE   promethazine 25 MG tablet Commonly known as:  PHENERGAN   traMADol 50 MG tablet Commonly known as:  ULTRAM     TAKE these medications   amitriptyline 10 MG tablet Commonly known as:  ELAVIL Take 1 tablet (10 mg total) by mouth at bedtime.   amLODipine 10 MG tablet Commonly known as:  NORVASC Take 1 tablet (10 mg total) by mouth at bedtime.   carvedilol 25 MG tablet Commonly known as:  COREG Take 1 tablet (25 mg total) by mouth 2 (two) times daily with a meal.   cloNIDine 0.3 mg/24hr patch Commonly known as:  CATAPRES - Dosed in mg/24 hr Place 0.3 mg onto the skin every Wednesday.   dicyclomine 20 MG tablet Commonly known as:  BENTYL Take 20 mg by mouth every 6 (six) hours.   docusate sodium 100 MG capsule Commonly known as:  COLACE Take 1 capsule (100 mg total) by mouth 2 (two) times daily.   doxazosin 4 MG tablet Commonly known as:  CARDURA Take 4 mg by mouth at bedtime.   isosorbide mononitrate 60 MG 24 hr tablet Commonly known as:   IMDUR Take 1 tablet (60 mg total) by mouth daily.   metoCLOPramide 5 MG tablet Commonly known as:  REGLAN Take 1 tablet (5 mg total) by mouth every 8 (eight) hours as needed for nausea or vomiting.   ondansetron 4 MG disintegrating tablet Commonly known as:  ZOFRAN-ODT Take 1 tablet (4 mg total) by mouth every 8 (eight) hours as needed for nausea. 79m ODT q4 hours prn nausea   pantoprazole 40 MG tablet Commonly known as:  PROTONIX Take 1 tablet (40 mg total) by mouth 2 (two) times daily before a meal.   Respiratory Therapy Supplies Misc 1 each daily. CPAP MACHINE   sevelamer carbonate 800 MG tablet Commonly known as:  RENVELA Take 3 tablets (2,400 mg total) by mouth 3 (three) times daily with meals.   warfarin 7.5 MG tablet Commonly known as:  COUMADIN Take 1 tablet (7.5 mg total) by mouth daily. Today and tomorrow. See coumadin pharmacist for further management. What changed:    medication strength  additional instructions       Vitals:   07/23/17 1700 07/23/17 1759  BP: (!) 146/99 (!) 150/98  Pulse: 70 68  Resp:  17  Temp:  97.8 F (36.6 C)  SpO2:  97%    Skin clean, dry and intact without evidence of skin break down, no evidence of skin tears noted. IV catheter discontinued intact. Site without signs and symptoms of complications.  Dressing and pressure applied. Pt denies pain at this time. No complaints noted.  An After Visit Summary was printed and given to the patient. Patient escorted via [place], and D/C home via private auto.  Chapman Fitch BSN, RN
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
## 2017-07-24 NOTE — Telephone Encounter (Signed)
Called patient to follow up on missed INR appt. Unable to reach him, left HIPAA compliant message requesting that he call back to reschedule.

## 2017-07-24 NOTE — Progress Notes (Signed)
  [facility] Progress Note   Subjective:  Seen in room, feels ok today. 2L removed with HD yesterday and now below his EDW. No dyspnea/CP. No AVF issues today.   Objective Vitals:   07/23/17 1600 07/23/17 1630 07/23/17 1700 07/23/17 1759  BP: (!) 136/92 138/89 (!) 146/99 (!) 150/98  Pulse: 69 68 70 68  Resp:    17  Temp:    97.8 F (36.6 C)  TempSrc:    Oral  SpO2:    97%  Weight:    78.9 kg (173 lb 15.1 oz)  Height:       Physical Exam General: Well appearing, NAD Heart: RRR; no murmur Lungs: CTAB Extremities: No LE edema Dialysis Access: L forearm AVF + thrill, + bruit with intermittent whistle  Additional Objective Labs: Basic Metabolic Panel: Recent Labs  Lab 07/21/17 2318 07/22/17 0545 07/23/17 1803  NA 136 135 134*  K 5.9* 6.0* 4.4  CL 95* 94* 97*  CO2 _0 GLUCOSE 91 85 115*  BUN 95* 99* 54*  CREATININE 14.21* 15.00* 10.19*  CALCIUM 9.6 9.1 8.7*  PHOS 7.4*  --  5.5*   Liver Function Tests: Recent Labs  Lab 07/21/17 0133 07/21/17 2318 07/22/17 0545 07/23/17 1803  AST 80*  --  139*  --   ALT 69*  --  133*  --   ALKPHOS 125  --  115  --   BILITOT 1.0  --  0.9  --   PROT 7.5  --  6.9  --   ALBUMIN 3.1* 3.1* 2.9* 2.7*   Recent Labs  Lab 07/21/17 0133  LIPASE 26   CBC: Recent Labs  Lab 07/21/17 0320 07/21/17 2318 07/22/17 0545  WBC 4.8 4.9 4.4  HGB 10.2* 10.3* 9.6*  HCT 31.6* 31.2* 29.4*  MCV 87.3 86.7 86.5  PLT 138* 124* 139*   Medications: . sodium chloride     . amitriptyline  10 mg Oral QHS  . amLODipine  10 mg Oral QHS  . carvedilol  25 mg Oral BID WC  . pantoprazole  40 mg Oral BID AC  . sevelamer carbonate  2,400 mg Oral TID WC  . sodium chloride flush  3 mL Intravenous Q12H  . Warfarin - Pharmacist Dosing Inpatient   Does not apply q1800    Dialysis Orders: MWF at [facility] 4hr, BFR 400, DFR 800, EDW 81kg, 2K/2.25Ca, L AVF, heparin 2000 bolus - Hectoral 86mg IV q HD + Sensipar 1881mPO qd (home) -  Mircera 15097mIV q 2 weeks (last 12/28)  Assessment/Plan: 1. Abdominal pain/nausea: Chronic/recurrent issue, no alarm symptoms. Improving. 2. ESRD: HD per MWF schedule, next 1/4 likely as outpatient. 3. HTN/volume: BP improving overall, EDW lowered by 2kg. 4. Anemia: Hgb 9.6, not due for ESA yet. 5. Secondary hyperparathyroidism: Back on Renvela, resume VDRA/sensipar. 6. Hx DVT/PE: On warfarin (missing doses), per primary. 7. Dialysis AVF/prolonged bleeding: Will need fistulogram as outpatient, open/useable for now. No bleeding issues after HD yesterday.   KatVeneta PentonA-C 07/24/2017, 9:37 AM  [facility] Pager: [phone]

## 2017-07-25 DIAGNOSIS — D631 Anemia in chronic kidney disease: Secondary | ICD-10-CM | POA: Diagnosis not present

## 2017-07-25 DIAGNOSIS — N2581 Secondary hyperparathyroidism of renal origin: Secondary | ICD-10-CM | POA: Diagnosis not present

## 2017-07-25 DIAGNOSIS — N186 End stage renal disease: Secondary | ICD-10-CM | POA: Diagnosis not present

## 2017-07-26 DIAGNOSIS — I5042 Chronic combined systolic (congestive) and diastolic (congestive) heart failure: Secondary | ICD-10-CM | POA: Diagnosis not present

## 2017-07-26 DIAGNOSIS — N186 End stage renal disease: Secondary | ICD-10-CM | POA: Insufficient documentation

## 2017-07-26 DIAGNOSIS — Y829 Unspecified medical devices associated with adverse incidents: Secondary | ICD-10-CM | POA: Insufficient documentation

## 2017-07-26 DIAGNOSIS — E1122 Type 2 diabetes mellitus with diabetic chronic kidney disease: Secondary | ICD-10-CM | POA: Insufficient documentation

## 2017-07-26 DIAGNOSIS — I132 Hypertensive heart and chronic kidney disease with heart failure and with stage 5 chronic kidney disease, or end stage renal disease: Secondary | ICD-10-CM | POA: Diagnosis not present

## 2017-07-26 DIAGNOSIS — T82590A Other mechanical complication of surgically created arteriovenous fistula, initial encounter: Secondary | ICD-10-CM | POA: Diagnosis not present

## 2017-07-26 DIAGNOSIS — Z87891 Personal history of nicotine dependence: Secondary | ICD-10-CM | POA: Insufficient documentation

## 2017-07-26 DIAGNOSIS — Z79899 Other long term (current) drug therapy: Secondary | ICD-10-CM | POA: Diagnosis not present

## 2017-07-27 ENCOUNTER — Emergency Department (HOSPITAL_COMMUNITY)
Admission: EM | Admit: 2017-07-27 | Discharge: 2017-07-27 | Disposition: A | Discharge status: Home / Self Care | Payer: Medicare Other | Attending: Emergency Medicine | Admitting: Emergency Medicine

## 2017-07-27 ENCOUNTER — Other Ambulatory Visit: Payer: Self-pay

## 2017-07-27 ENCOUNTER — Encounter (HOSPITAL_COMMUNITY): Payer: Self-pay | Admitting: Emergency Medicine

## 2017-07-27 DIAGNOSIS — T82590A Other mechanical complication of surgically created arteriovenous fistula, initial encounter: Secondary | ICD-10-CM

## 2017-07-27 LAB — PROTIME-INR
INR: 1.22
Prothrombin Time: 15.3 seconds — ABNORMAL HIGH (ref 11.4–15.2)

## 2017-07-27 LAB — CBC WITH DIFFERENTIAL/PLATELET
BASOS ABS: 0 10*3/uL (ref 0.0–0.1)
Basophils Relative: 0 %
Eosinophils Absolute: 0.2 10*3/uL (ref 0.0–0.7)
Eosinophils Relative: 4 %
HEMATOCRIT: 33.5 % — AB (ref 39.0–52.0)
HEMOGLOBIN: 10.4 g/dL — AB (ref 13.0–17.0)
LYMPHS PCT: 17 %
Lymphs Abs: 0.8 10*3/uL (ref 0.7–4.0)
MCH: 27.9 pg (ref 26.0–34.0)
MCHC: 31 g/dL (ref 30.0–36.0)
MCV: 89.8 fL (ref 78.0–100.0)
MONO ABS: 0.5 10*3/uL (ref 0.1–1.0)
MONOS PCT: 10 %
NEUTROS ABS: 3.3 10*3/uL (ref 1.7–7.7)
Neutrophils Relative %: 69 %
Platelets: 189 10*3/uL (ref 150–400)
RBC: 3.73 MIL/uL — ABNORMAL LOW (ref 4.22–5.81)
RDW: 17.8 % — AB (ref 11.5–15.5)
WBC: 4.8 10*3/uL (ref 4.0–10.5)

## 2017-07-27 LAB — BASIC METABOLIC PANEL
ANION GAP: 12 (ref 5–15)
BUN: 57 mg/dL — ABNORMAL HIGH (ref 6–20)
CALCIUM: 9.3 mg/dL (ref 8.9–10.3)
CHLORIDE: 100 mmol/L — AB (ref 101–111)
CO2: 26 mmol/L (ref 22–32)
Creatinine, Ser: 9.1 mg/dL — ABNORMAL HIGH (ref 0.61–1.24)
GFR calc Af Amer: 7 mL/min — ABNORMAL LOW (ref >60)
GFR calc non Af Amer: 6 mL/min — ABNORMAL LOW (ref >60)
GLUCOSE: 102 mg/dL — AB (ref 65–99)
Potassium: 4.4 mmol/L (ref 3.5–5.1)
Sodium: 138 mmol/L (ref 135–145)

## 2017-07-27 MED ORDER — OXYCODONE HCL 5 MG PO TABS: 5.0000 mg | ORAL_TABLET | ORAL | 0 refills | Status: DC | PRN

## 2017-07-27 MED ORDER — ONDANSETRON 4 MG PO TBDP
8.0000 mg | ORAL_TABLET | Freq: Once | ORAL | Status: AC
Start: 2017-07-27 — End: 2017-07-27
  Administered 2017-07-27: 8 mg via ORAL
  Filled 2017-07-27: qty 2

## 2017-07-27 MED ORDER — HYDROMORPHONE HCL 1 MG/ML IJ SOLN
1.0000 mg | Freq: Once | INTRAMUSCULAR | Status: AC
  Administered 2017-07-27: 1 mg via INTRAMUSCULAR
  Filled 2017-07-27: qty 1
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
## 2017-07-27 NOTE — ED Triage Notes (Signed)
Pt c/o pain and numbness to L arm around access site. Pt states he did full tx yesterday at HD.  +bruit and thrill. +CMS. Pt c/o sensitivity with touch.

## 2017-07-27 NOTE — Discharge Instructions (Signed)
Follow-up on Monday at dialysis for referral for fistulogram to determine if there is clot in your fistula.

## 2017-07-28 ENCOUNTER — Telehealth: Payer: Self-pay | Admitting: Pharmacist

## 2017-07-28 ENCOUNTER — Encounter (HOSPITAL_COMMUNITY): Payer: Self-pay | Admitting: Emergency Medicine

## 2017-07-28 ENCOUNTER — Other Ambulatory Visit: Payer: Self-pay

## 2017-07-28 ENCOUNTER — Emergency Department (HOSPITAL_COMMUNITY)
Admission: EM | Admit: 2017-07-28 | Discharge: 2017-07-29 | Disposition: A | Discharge status: Home / Self Care | Payer: Medicare Other | Attending: Emergency Medicine | Admitting: Emergency Medicine

## 2017-07-28 DIAGNOSIS — Z5321 Procedure and treatment not carried out due to patient leaving prior to being seen by health care provider: Secondary | ICD-10-CM | POA: Diagnosis not present

## 2017-07-28 DIAGNOSIS — Z452 Encounter for adjustment and management of vascular access device: Secondary | ICD-10-CM | POA: Diagnosis not present

## 2017-07-28 LAB — BASIC METABOLIC PANEL
Anion gap: 16 — ABNORMAL HIGH (ref 5–15)
BUN: 85 mg/dL — AB (ref 6–20)
CO2: 24 mmol/L (ref 22–32)
CREATININE: 11.26 mg/dL — AB (ref 0.61–1.24)
Calcium: 9.2 mg/dL (ref 8.9–10.3)
Chloride: 95 mmol/L — ABNORMAL LOW (ref 101–111)
GFR calc Af Amer: 5 mL/min — ABNORMAL LOW (ref >60)
GFR calc non Af Amer: 5 mL/min — ABNORMAL LOW (ref >60)
GLUCOSE: 81 mg/dL (ref 65–99)
Potassium: 5.2 mmol/L — ABNORMAL HIGH (ref 3.5–5.1)
Sodium: 135 mmol/L (ref 135–145)

## 2017-07-28 LAB — CBC
HCT: 34 % — ABNORMAL LOW (ref 39.0–52.0)
Hemoglobin: 10.8 g/dL — ABNORMAL LOW (ref 13.0–17.0)
MCH: 27.8 pg (ref 26.0–34.0)
MCHC: 31.8 g/dL (ref 30.0–36.0)
MCV: 87.4 fL (ref 78.0–100.0)
PLATELETS: 178 10*3/uL (ref 150–400)
RBC: 3.89 MIL/uL — AB (ref 4.22–5.81)
RDW: 17.5 % — AB (ref 11.5–15.5)
WBC: 4.8 10*3/uL (ref 4.0–10.5)

## 2017-07-28 LAB — I-STAT TROPONIN, ED: Troponin i, poc: 0.04 ng/mL (ref 0.00–0.08)

## 2017-07-28 NOTE — ED Notes (Signed)
I went back in triage room 1 to draw labs,  Pt refused and stated he is still on the phone.  Triage nurse notified

## 2017-07-28 NOTE — Telephone Encounter (Signed)
Patient currently in ED. Will not call to reschedule INR appt at this time. Will attempt to obtain at visit with Dr. Doreene Burke on Wednesday

## 2017-07-28 NOTE — ED Triage Notes (Signed)
Pt presents with needing dialysis (last session thurs); also reports current access is "clotting"; pt states he was referred by vascular specialist, home dialysis center refuses to access it; pt currently hypertensive and dizzy

## 2017-07-28 NOTE — ED Notes (Addendum)
Pt using personal phone calling [facility] dialysis center attempting to place self on list for dialysis Pt stating "ma'am! This is an emergency! I need to go to dialysis! The vascular doctor said so!" I attempted to redirect him and inform him that since he checked in as an ER patient, he should be seen by an ER physician first; pt escalating; Charge RN made aware

## 2017-07-28 NOTE — ED Notes (Signed)
Pt on phone and request that I come back in 1 minute.

## 2017-07-29 DIAGNOSIS — N186 End stage renal disease: Secondary | ICD-10-CM | POA: Diagnosis not present

## 2017-07-29 DIAGNOSIS — D631 Anemia in chronic kidney disease: Secondary | ICD-10-CM | POA: Diagnosis not present

## 2017-07-29 DIAGNOSIS — N2581 Secondary hyperparathyroidism of renal origin: Secondary | ICD-10-CM | POA: Diagnosis not present

## 2017-07-30 ENCOUNTER — Inpatient Hospital Stay: Payer: Medicare Other | Admitting: Internal Medicine

## 2017-07-30 ENCOUNTER — Telehealth: Payer: Self-pay | Admitting: Pharmacist

## 2017-07-30 IMAGING — CT CT ABD-PELV W/O CM
2 of 4 series · 9 of 46 positions shown, 10 images · non-contrast
Comparison: Scrotal ultrasound March 10, 2015 and CT abdomen and
pelvis February 12, 2015

CLINICAL DATA: Status post hernia repair, pain at incision site for
3 days. History of end-stage renal disease, on dialysis.

EXAM:
CT ABDOMEN AND PELVIS WITHOUT CONTRAST
TECHNIQUE: Multidetector CT imaging of the abdomen and pelvis was performed
following the standard protocol without IV contrast.

[Series 301: routine, idose (2) · axial · 0.85mm/px · z∈[+392,+787]mm · 6 of 99 slices shown, 7 images]
[im 12/99  soft-tissue]
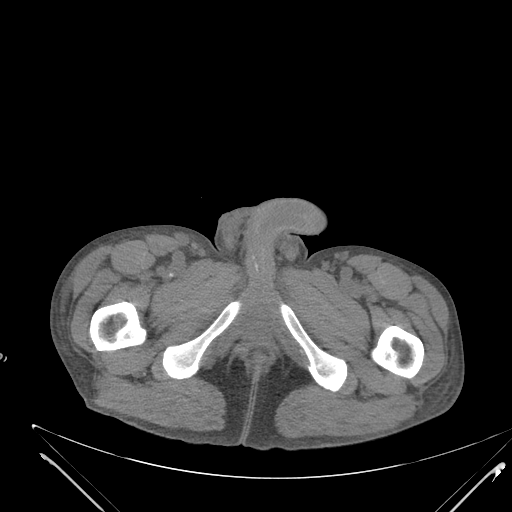
[im 12/99  bone]
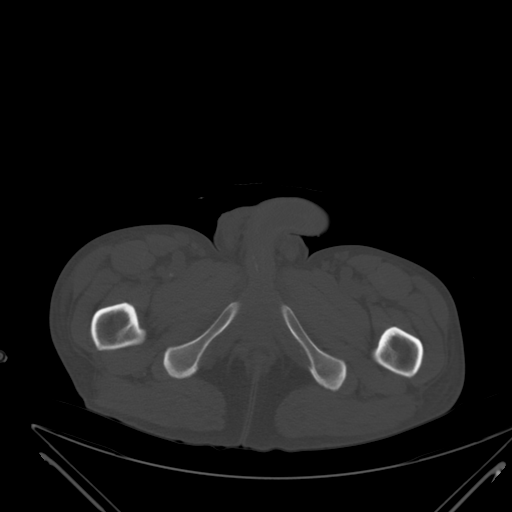
[im 28/99  soft-tissue]
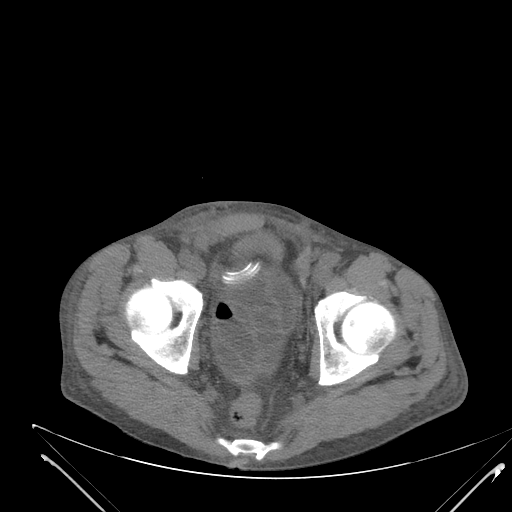
[im 44/99  soft-tissue]
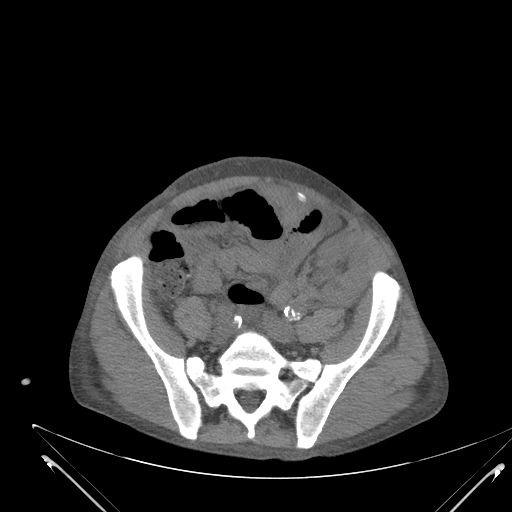
[im 59/99  soft-tissue]
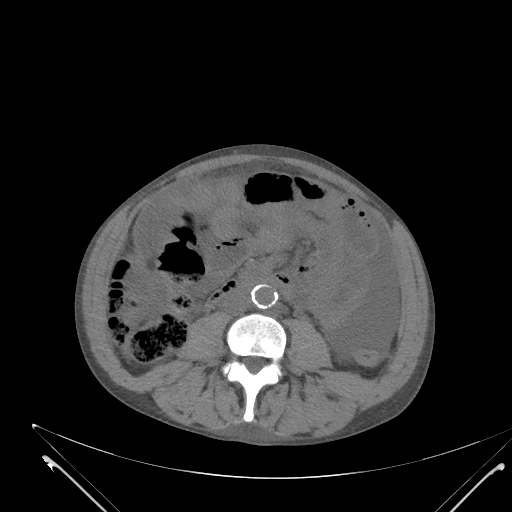
[im 75/99  soft-tissue]
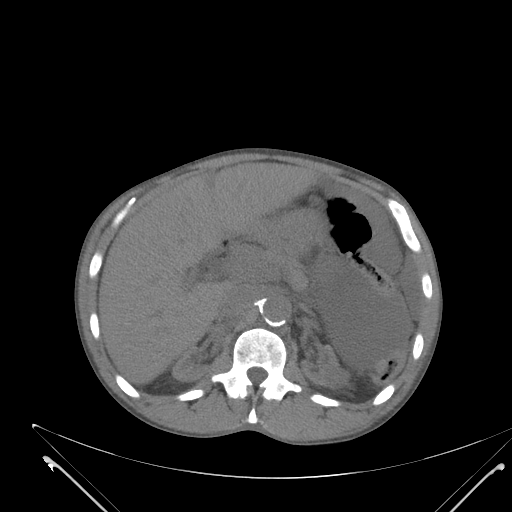
[im 91/99  soft-tissue]
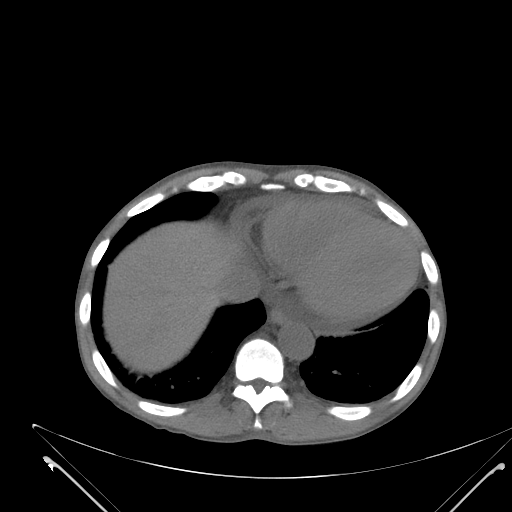

[Series 303: coronals, idose (2) · coronal · 0.45mm/px · 3 of 148 slices shown]
[im 50/148  soft-tissue]
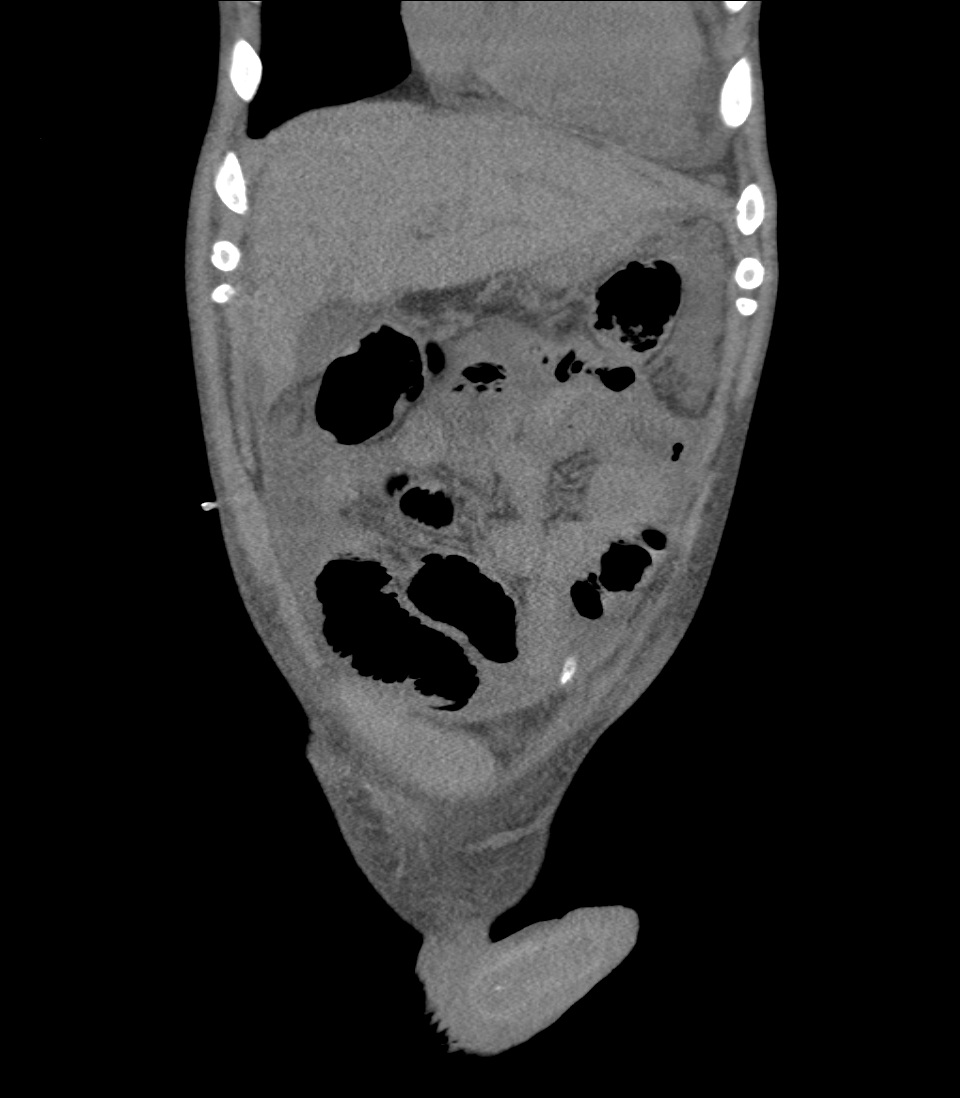
[im 66/148  soft-tissue]
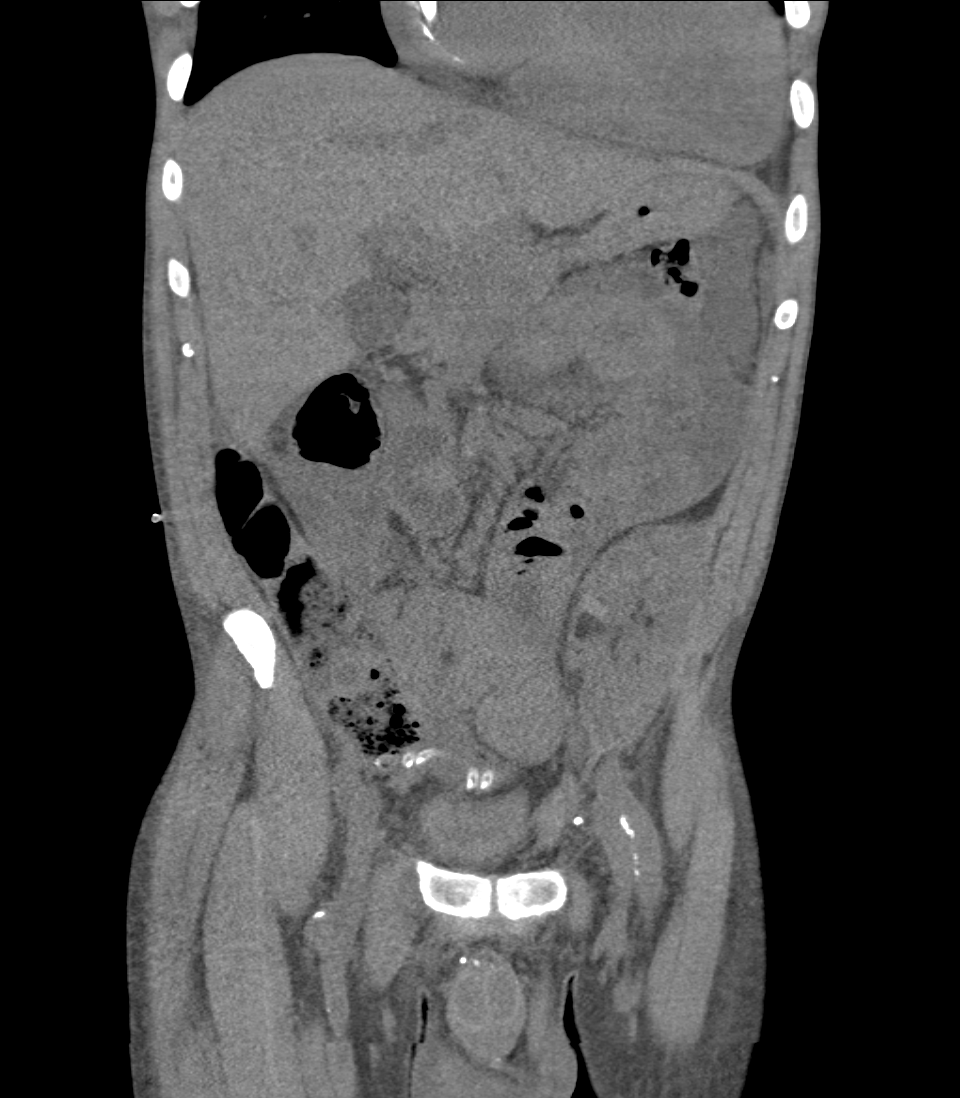
[im 82/148  soft-tissue]
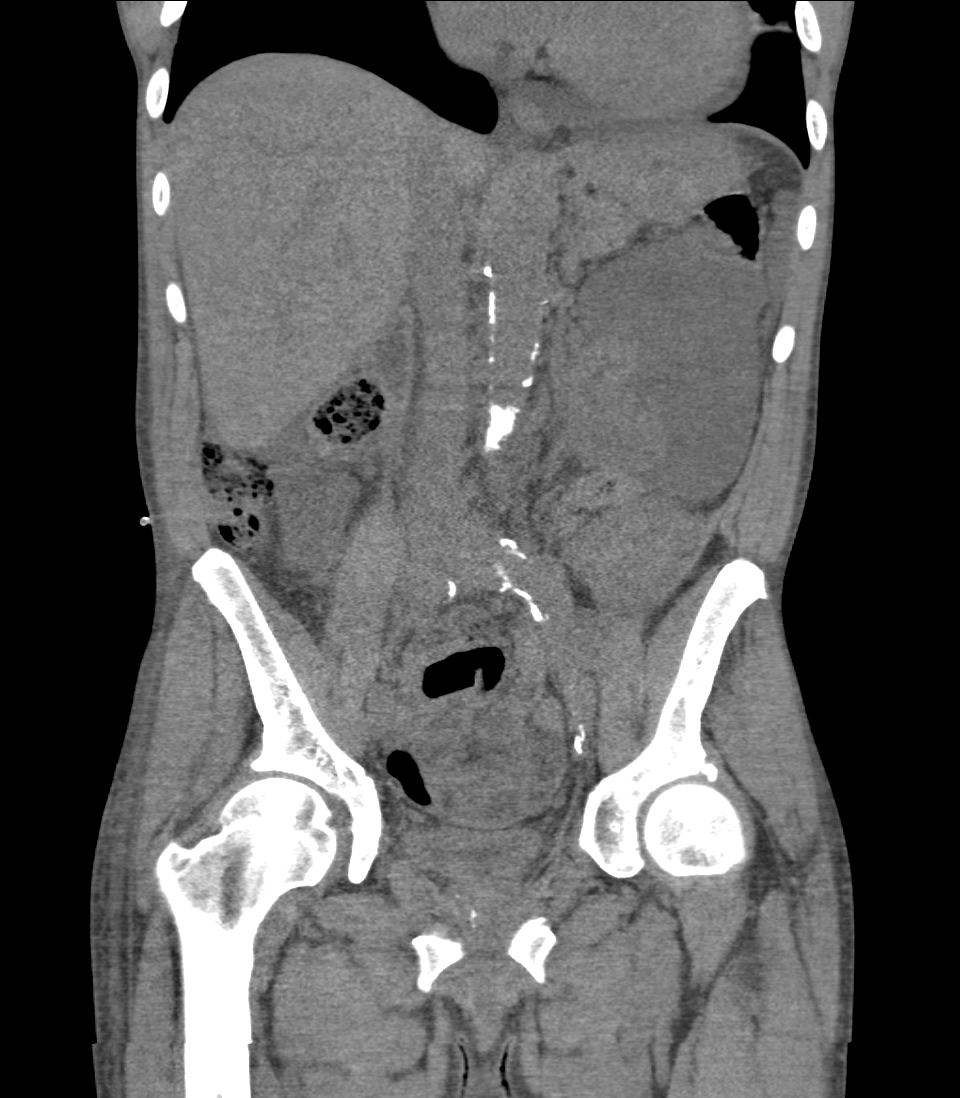

[9 of 46 positions shown; findings below may reference images not displayed]

FINDINGS: LUNG BASES: The included heart is enlarged. Dependent atelectasis.
Small pericardial effusions. Partially imaged dialysis catheter in
RIGHT atrium.

KIDNEYS/BLADDER: Kidneys are orthotopic, and severely atrophic
bilaterally. Stable 12 mm RIGHT upper pole density cyst. Stable 19
mm exophytic intermediate LEFT interpolar 15 mm cyst. Limited
assessment for renal masses on this nonenhanced examination. The
unopacified ureters are normal in course and caliber. Urinary
bladder is decompressed and unremarkable.

SOLID ORGANS: The liver, spleen, gallbladder, pancreas and adrenal
glands are unremarkable for this non-contrast examination.

GASTROINTESTINAL TRACT: The stomach, small and large bowel are
normal in course and caliber without inflammatory changes, the
sensitivity may be decreased by lack of enteric contrast. Normal
appendix.

PERITONEUM/RETROPERITONEUM: Peritoneal dialysis catheter terminates
in the pelvis. Moderate to large amount of ascites, increased from
prior examination. No intraperitoneal free air. LEFT pelvic kidney
transplant. Aortoiliac vessels are normal in course and caliber,
severe calcific atherosclerosis. No lymphadenopathy by CT size
criteria. Prostate size appears normal. Seminal vesicle
calcifications can be seen with diabetes.

SOFT TISSUES/ OSSEOUS STRUCTURES: Thickened RIGHT spermatic cord
without discrete mass, fluid collection or definite bowel though
limited by non contrast examination. Bilateral inguinal
lymphadenopathy with reniform morphology is similar.
IMPRESSION: Thickened appearance of the RIGHT spermatic cord compatible with
postoperative change, no discrete hernia sac. Mild inguinal
lymphadenopathy is likely reactive.

Moderate to large amount of ascites with peritoneal dialysis
catheter in place.

Stable cardiomegaly and small pericardial effusion.

## 2017-07-30 NOTE — Telephone Encounter (Signed)
Patient did not show up for visit with Dr. Doreene Burke. Attempted to call him and he did not answer and I was unable to leave a voicemail because his mailbox was full. Have made multiple attempts to contact patient. We are unable to manage his warfarin. He will need to be seen by a primary care provider here to establish care.

## 2017-07-31 ENCOUNTER — Emergency Department (HOSPITAL_COMMUNITY)
Admission: EM | Admit: 2017-07-31 | Discharge: 2017-07-31 | Disposition: A | Discharge status: Home / Self Care | Payer: Medicare Other | Attending: Emergency Medicine | Admitting: Emergency Medicine

## 2017-07-31 ENCOUNTER — Encounter (HOSPITAL_COMMUNITY): Payer: Self-pay

## 2017-07-31 DIAGNOSIS — Z5321 Procedure and treatment not carried out due to patient leaving prior to being seen by health care provider: Secondary | ICD-10-CM | POA: Insufficient documentation

## 2017-07-31 DIAGNOSIS — R0602 Shortness of breath: Secondary | ICD-10-CM | POA: Insufficient documentation

## 2017-07-31 LAB — CBC
HEMATOCRIT: 31 % — AB (ref 39.0–52.0)
HEMOGLOBIN: 10 g/dL — AB (ref 13.0–17.0)
MCH: 27.9 pg (ref 26.0–34.0)
MCHC: 32.3 g/dL (ref 30.0–36.0)
MCV: 86.4 fL (ref 78.0–100.0)
Platelets: 203 10*3/uL (ref 150–400)
RBC: 3.59 MIL/uL — ABNORMAL LOW (ref 4.22–5.81)
RDW: 17.2 % — AB (ref 11.5–15.5)
WBC: 3.7 10*3/uL — ABNORMAL LOW (ref 4.0–10.5)

## 2017-07-31 LAB — COMPREHENSIVE METABOLIC PANEL
ALBUMIN: 3.3 g/dL — AB (ref 3.5–5.0)
ALT: 36 U/L (ref 17–63)
ANION GAP: 18 — AB (ref 5–15)
AST: 37 U/L (ref 15–41)
Alkaline Phosphatase: 119 U/L (ref 38–126)
BILIRUBIN TOTAL: 0.8 mg/dL (ref 0.3–1.2)
BUN: 97 mg/dL — AB (ref 6–20)
CHLORIDE: 95 mmol/L — AB (ref 101–111)
CO2: 22 mmol/L (ref 22–32)
Calcium: 8.6 mg/dL — ABNORMAL LOW (ref 8.9–10.3)
Creatinine, Ser: 13.1 mg/dL — ABNORMAL HIGH (ref 0.61–1.24)
GFR calc Af Amer: 4 mL/min — ABNORMAL LOW (ref >60)
GFR calc non Af Amer: 4 mL/min — ABNORMAL LOW (ref >60)
GLUCOSE: 82 mg/dL (ref 65–99)
POTASSIUM: 5.3 mmol/L — AB (ref 3.5–5.1)
SODIUM: 135 mmol/L (ref 135–145)
Total Protein: 7.6 g/dL (ref 6.5–8.1)

## 2017-07-31 LAB — LIPASE, BLOOD: Lipase: 27 U/L (ref 11–51)

## 2017-07-31 NOTE — ED Notes (Signed)
Pt sts unable to wait. Discussed returned precautions. apologetic about wait.

## 2017-07-31 NOTE — ED Triage Notes (Signed)
Per Pt, Pt is coming from home with complaints of SOB. Reports that he called dialysis today and they told him that they couldn't see him. Reports working and starting to have abdominal pain while he was working.

## 2017-08-01 ENCOUNTER — Emergency Department (HOSPITAL_COMMUNITY)
Admission: EM | Admit: 2017-08-01 | Discharge: 2017-08-01 | Disposition: A | Discharge status: Home / Self Care | Payer: Medicare Other | Attending: Emergency Medicine | Admitting: Emergency Medicine

## 2017-08-01 ENCOUNTER — Encounter (HOSPITAL_COMMUNITY): Payer: Self-pay | Admitting: Emergency Medicine

## 2017-08-01 DIAGNOSIS — N186 End stage renal disease: Secondary | ICD-10-CM | POA: Diagnosis not present

## 2017-08-01 DIAGNOSIS — Z87891 Personal history of nicotine dependence: Secondary | ICD-10-CM | POA: Diagnosis not present

## 2017-08-01 DIAGNOSIS — K2970 Gastritis, unspecified, without bleeding: Secondary | ICD-10-CM | POA: Diagnosis not present

## 2017-08-01 DIAGNOSIS — I132 Hypertensive heart and chronic kidney disease with heart failure and with stage 5 chronic kidney disease, or end stage renal disease: Secondary | ICD-10-CM | POA: Insufficient documentation

## 2017-08-01 DIAGNOSIS — R112 Nausea with vomiting, unspecified: Secondary | ICD-10-CM | POA: Diagnosis not present

## 2017-08-01 DIAGNOSIS — E875 Hyperkalemia: Secondary | ICD-10-CM

## 2017-08-01 DIAGNOSIS — I5042 Chronic combined systolic (congestive) and diastolic (congestive) heart failure: Secondary | ICD-10-CM | POA: Diagnosis not present

## 2017-08-01 DIAGNOSIS — Z79899 Other long term (current) drug therapy: Secondary | ICD-10-CM | POA: Insufficient documentation

## 2017-08-01 DIAGNOSIS — D631 Anemia in chronic kidney disease: Secondary | ICD-10-CM | POA: Diagnosis not present

## 2017-08-01 DIAGNOSIS — Z992 Dependence on renal dialysis: Secondary | ICD-10-CM | POA: Diagnosis not present

## 2017-08-01 DIAGNOSIS — I120 Hypertensive chronic kidney disease with stage 5 chronic kidney disease or end stage renal disease: Secondary | ICD-10-CM | POA: Diagnosis not present

## 2017-08-01 DIAGNOSIS — N2581 Secondary hyperparathyroidism of renal origin: Secondary | ICD-10-CM | POA: Diagnosis not present

## 2017-08-01 DIAGNOSIS — R109 Unspecified abdominal pain: Secondary | ICD-10-CM | POA: Diagnosis present

## 2017-08-01 DIAGNOSIS — E119 Type 2 diabetes mellitus without complications: Secondary | ICD-10-CM | POA: Diagnosis not present

## 2017-08-01 LAB — CBC
HCT: 31.8 % — ABNORMAL LOW (ref 39.0–52.0)
Hemoglobin: 10.3 g/dL — ABNORMAL LOW (ref 13.0–17.0)
MCH: 27.7 pg (ref 26.0–34.0)
MCHC: 32.4 g/dL (ref 30.0–36.0)
MCV: 85.5 fL (ref 78.0–100.0)
Platelets: 195 10*3/uL (ref 150–400)
RBC: 3.72 MIL/uL — ABNORMAL LOW (ref 4.22–5.81)
RDW: 17.1 % — ABNORMAL HIGH (ref 11.5–15.5)
WBC: 5.1 10*3/uL (ref 4.0–10.5)

## 2017-08-01 LAB — BASIC METABOLIC PANEL
Anion gap: 23 — ABNORMAL HIGH (ref 5–15)
BUN: 112 mg/dL — ABNORMAL HIGH (ref 6–20)
CO2: 21 mmol/L — ABNORMAL LOW (ref 22–32)
Calcium: 8.9 mg/dL (ref 8.9–10.3)
Chloride: 94 mmol/L — ABNORMAL LOW (ref 101–111)
Creatinine, Ser: 14.38 mg/dL — ABNORMAL HIGH (ref 0.61–1.24)
GFR calc Af Amer: 4 mL/min — ABNORMAL LOW (ref >60)
GFR calc non Af Amer: 3 mL/min — ABNORMAL LOW (ref >60)
Glucose, Bld: 56 mg/dL — ABNORMAL LOW (ref 65–99)
Potassium: 7 mmol/L (ref 3.5–5.1)
Sodium: 138 mmol/L (ref 135–145)

## 2017-08-01 MED ORDER — SODIUM POLYSTYRENE SULFONATE 15 GM/60ML PO SUSP
60.0000 g | Freq: Once | ORAL | Status: DC
  Filled 2017-08-01: qty 240

## 2017-08-01 MED ORDER — ONDANSETRON 4 MG PO TBDP
4.0000 mg | ORAL_TABLET | Freq: Once | ORAL | Status: AC
  Administered 2017-08-01: 4 mg via ORAL
  Filled 2017-08-01: qty 1

## 2017-08-01 MED ORDER — CALCIUM GLUCONATE 10 % IV SOLN
1.0000 g | Freq: Once | INTRAVENOUS | Status: AC
  Administered 2017-08-01: 1 g via INTRAVENOUS
  Filled 2017-08-01: qty 10

## 2017-08-01 MED ORDER — NEPRO/CARBSTEADY PO LIQD
237.0000 mL | Freq: Once | ORAL | Status: DC
  Filled 2017-08-01: qty 237

## 2017-08-01 MED ORDER — SODIUM BICARBONATE 8.4 % IV SOLN
50.0000 meq | Freq: Once | INTRAVENOUS | Status: AC
Start: 2017-08-01 — End: 2017-08-01
  Administered 2017-08-01: 50 meq via INTRAVENOUS
  Filled 2017-08-01: qty 50

## 2017-08-01 NOTE — ED Notes (Signed)
Date and time results received: 08/01/17 6:46 AM  (use smartphrase ".now" to insert current time)  Test: Potassium Critical Value: 7.0  Name of Provider Notified: Dr. Stark Jock  Orders Received? Or Actions Taken?: pull pt back to treatment room for cardiac monitoring and emergent dialysis

## 2017-08-01 NOTE — ED Notes (Signed)
Refused vitals
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
## 2017-08-01 NOTE — ED Notes (Signed)
Pt placed in Triage HW in recliner for comfort. Pt refusing to go to waiting area.

## 2017-08-01 NOTE — ED Triage Notes (Signed)
Pt arrives with [facility] for abd pain and nausea that started yesterday. Pt due for dialysis today.

## 2017-08-01 NOTE — ED Notes (Addendum)
Received call from Regan Rakers at [facility] regarding pt's presence in ED. Pt has not been into clinic for dialysis since the 8th of January, states patient is avoiding coming to the clinic and choosing ED instead. Pt to have dialysis in clinic at noon today, [facility] is wanting pt to be discharged and is willing to accomodate for transport to clinic.

## 2017-08-04 DIAGNOSIS — N186 End stage renal disease: Secondary | ICD-10-CM | POA: Diagnosis not present

## 2017-08-04 DIAGNOSIS — N2581 Secondary hyperparathyroidism of renal origin: Secondary | ICD-10-CM | POA: Diagnosis not present

## 2017-08-04 DIAGNOSIS — D631 Anemia in chronic kidney disease: Secondary | ICD-10-CM | POA: Diagnosis not present

## 2017-08-05 ENCOUNTER — Other Ambulatory Visit: Payer: Self-pay

## 2017-08-05 ENCOUNTER — Encounter (HOSPITAL_COMMUNITY): Payer: Self-pay

## 2017-08-05 DIAGNOSIS — Z86718 Personal history of other venous thrombosis and embolism: Secondary | ICD-10-CM | POA: Insufficient documentation

## 2017-08-05 DIAGNOSIS — M79602 Pain in left arm: Secondary | ICD-10-CM | POA: Diagnosis not present

## 2017-08-05 DIAGNOSIS — K859 Acute pancreatitis without necrosis or infection, unspecified: Secondary | ICD-10-CM | POA: Diagnosis not present

## 2017-08-05 DIAGNOSIS — I132 Hypertensive heart and chronic kidney disease with heart failure and with stage 5 chronic kidney disease, or end stage renal disease: Secondary | ICD-10-CM

## 2017-08-05 DIAGNOSIS — Z87891 Personal history of nicotine dependence: Secondary | ICD-10-CM

## 2017-08-05 DIAGNOSIS — Y69 Unspecified misadventure during surgical and medical care: Secondary | ICD-10-CM

## 2017-08-05 DIAGNOSIS — Z992 Dependence on renal dialysis: Secondary | ICD-10-CM

## 2017-08-05 DIAGNOSIS — Z79899 Other long term (current) drug therapy: Secondary | ICD-10-CM | POA: Insufficient documentation

## 2017-08-05 DIAGNOSIS — Z7901 Long term (current) use of anticoagulants: Secondary | ICD-10-CM

## 2017-08-05 DIAGNOSIS — E1122 Type 2 diabetes mellitus with diabetic chronic kidney disease: Secondary | ICD-10-CM | POA: Insufficient documentation

## 2017-08-05 DIAGNOSIS — I5042 Chronic combined systolic (congestive) and diastolic (congestive) heart failure: Secondary | ICD-10-CM

## 2017-08-05 DIAGNOSIS — N186 End stage renal disease: Secondary | ICD-10-CM

## 2017-08-05 DIAGNOSIS — T82848A Pain from vascular prosthetic devices, implants and grafts, initial encounter: Secondary | ICD-10-CM | POA: Insufficient documentation

## 2017-08-05 DIAGNOSIS — I429 Cardiomyopathy, unspecified: Secondary | ICD-10-CM | POA: Diagnosis not present

## 2017-08-05 DIAGNOSIS — N2581 Secondary hyperparathyroidism of renal origin: Secondary | ICD-10-CM | POA: Diagnosis not present

## 2017-08-05 DIAGNOSIS — I871 Compression of vein: Secondary | ICD-10-CM | POA: Diagnosis not present

## 2017-08-05 DIAGNOSIS — T82858A Stenosis of vascular prosthetic devices, implants and grafts, initial encounter: Secondary | ICD-10-CM | POA: Diagnosis not present

## 2017-08-05 NOTE — ED Triage Notes (Signed)
Pt states that he is having arm pain where they did something with his dialysis graft today with stiches. Pt states the pain is so bad that it hurts his stomach and his back. Received full dialysis today

## 2017-08-06 ENCOUNTER — Emergency Department (HOSPITAL_COMMUNITY)
Admission: EM | Admit: 2017-08-06 | Discharge: 2017-08-06 | Disposition: A | Discharge status: Home / Self Care | Payer: Medicare Other | Source: Home / Self Care | Attending: Emergency Medicine | Admitting: Emergency Medicine

## 2017-08-06 DIAGNOSIS — D631 Anemia in chronic kidney disease: Secondary | ICD-10-CM | POA: Diagnosis not present

## 2017-08-06 DIAGNOSIS — N186 End stage renal disease: Secondary | ICD-10-CM | POA: Diagnosis not present

## 2017-08-06 DIAGNOSIS — T82848A Pain from vascular prosthetic devices, implants and grafts, initial encounter: Secondary | ICD-10-CM

## 2017-08-06 DIAGNOSIS — N2581 Secondary hyperparathyroidism of renal origin: Secondary | ICD-10-CM | POA: Diagnosis not present

## 2017-08-06 MED ORDER — ONDANSETRON 4 MG PO TBDP
4.0000 mg | ORAL_TABLET | Freq: Once | ORAL | Status: AC
  Administered 2017-08-06: 4 mg via ORAL
  Filled 2017-08-06: qty 1

## 2017-08-06 MED ORDER — OXYCODONE HCL 5 MG PO TABS
5.0000 mg | ORAL_TABLET | Freq: Once | ORAL | Status: AC
  Administered 2017-08-06: 5 mg via ORAL
  Filled 2017-08-06: qty 1

## 2017-08-06 NOTE — Discharge Instructions (Signed)
You were seen today for pain in your left arm.  This is likely related to your recent procedure.  Your fistula has a good thrill.  Follow-up with your vascular surgeon as well as dialysis as directed.

## 2017-08-06 NOTE — Discharge Planning (Signed)
EDCM contacted [facility] to confirm pt has HD appointment today.  Pt is scheduled for 11:00.  [facility] consulted EDSW to provide transportation, as pt states he does not have a ride.  No further EDCM needs identified at this time.  Alizay Bronkema J. Clydene Laming, [place], [place], [place]
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
## 2017-08-07 DIAGNOSIS — R0602 Shortness of breath: Secondary | ICD-10-CM | POA: Diagnosis not present

## 2017-08-07 DIAGNOSIS — N281 Cyst of kidney, acquired: Secondary | ICD-10-CM | POA: Diagnosis not present

## 2017-08-07 DIAGNOSIS — Z9115 Patient's noncompliance with renal dialysis: Secondary | ICD-10-CM | POA: Diagnosis not present

## 2017-08-07 DIAGNOSIS — R0989 Other specified symptoms and signs involving the circulatory and respiratory systems: Secondary | ICD-10-CM | POA: Diagnosis not present

## 2017-08-07 DIAGNOSIS — R5383 Other fatigue: Secondary | ICD-10-CM | POA: Diagnosis not present

## 2017-08-07 DIAGNOSIS — R918 Other nonspecific abnormal finding of lung field: Secondary | ICD-10-CM | POA: Diagnosis not present

## 2017-08-07 DIAGNOSIS — I517 Cardiomegaly: Secondary | ICD-10-CM | POA: Diagnosis not present

## 2017-08-07 DIAGNOSIS — N186 End stage renal disease: Secondary | ICD-10-CM | POA: Diagnosis not present

## 2017-08-07 DIAGNOSIS — E875 Hyperkalemia: Secondary | ICD-10-CM | POA: Diagnosis not present

## 2017-08-07 DIAGNOSIS — Z7901 Long term (current) use of anticoagulants: Secondary | ICD-10-CM | POA: Diagnosis not present

## 2017-08-07 DIAGNOSIS — K858 Other acute pancreatitis without necrosis or infection: Secondary | ICD-10-CM | POA: Diagnosis not present

## 2017-08-07 DIAGNOSIS — Z992 Dependence on renal dialysis: Secondary | ICD-10-CM | POA: Diagnosis not present

## 2017-08-07 DIAGNOSIS — R6883 Chills (without fever): Secondary | ICD-10-CM | POA: Diagnosis not present

## 2017-08-07 DIAGNOSIS — R188 Other ascites: Secondary | ICD-10-CM | POA: Diagnosis not present

## 2017-08-07 DIAGNOSIS — K861 Other chronic pancreatitis: Secondary | ICD-10-CM | POA: Diagnosis not present

## 2017-08-07 DIAGNOSIS — N2889 Other specified disorders of kidney and ureter: Secondary | ICD-10-CM | POA: Diagnosis not present

## 2017-08-07 DIAGNOSIS — R531 Weakness: Secondary | ICD-10-CM | POA: Diagnosis not present

## 2017-08-07 DIAGNOSIS — R079 Chest pain, unspecified: Secondary | ICD-10-CM | POA: Diagnosis not present

## 2017-08-07 DIAGNOSIS — R9431 Abnormal electrocardiogram [ECG] [EKG]: Secondary | ICD-10-CM | POA: Diagnosis not present

## 2017-08-07 DIAGNOSIS — K567 Ileus, unspecified: Secondary | ICD-10-CM | POA: Diagnosis not present

## 2017-08-07 DIAGNOSIS — R109 Unspecified abdominal pain: Secondary | ICD-10-CM | POA: Diagnosis not present

## 2017-08-07 DIAGNOSIS — Z888 Allergy status to other drugs, medicaments and biological substances status: Secondary | ICD-10-CM | POA: Diagnosis not present

## 2017-08-07 DIAGNOSIS — I120 Hypertensive chronic kidney disease with stage 5 chronic kidney disease or end stage renal disease: Secondary | ICD-10-CM | POA: Diagnosis not present

## 2017-08-07 DIAGNOSIS — Z79899 Other long term (current) drug therapy: Secondary | ICD-10-CM | POA: Diagnosis not present

## 2017-08-08 ENCOUNTER — Emergency Department (HOSPITAL_COMMUNITY): Payer: Medicare Other

## 2017-08-08 ENCOUNTER — Inpatient Hospital Stay (HOSPITAL_COMMUNITY)
Admission: EM | Admit: 2017-08-08 | Discharge: 2017-08-12 | DRG: 438 | Disposition: A | Discharge status: Home / Self Care | Payer: Medicare Other | Attending: Family Medicine | Admitting: Family Medicine

## 2017-08-08 ENCOUNTER — Encounter (HOSPITAL_COMMUNITY): Payer: Self-pay | Admitting: Emergency Medicine

## 2017-08-08 ENCOUNTER — Other Ambulatory Visit: Payer: Self-pay

## 2017-08-08 DIAGNOSIS — N189 Chronic kidney disease, unspecified: Secondary | ICD-10-CM

## 2017-08-08 DIAGNOSIS — R791 Abnormal coagulation profile: Secondary | ICD-10-CM | POA: Diagnosis present

## 2017-08-08 DIAGNOSIS — E1159 Type 2 diabetes mellitus with other circulatory complications: Secondary | ICD-10-CM | POA: Diagnosis present

## 2017-08-08 DIAGNOSIS — I5042 Chronic combined systolic (congestive) and diastolic (congestive) heart failure: Secondary | ICD-10-CM | POA: Diagnosis present

## 2017-08-08 DIAGNOSIS — I152 Hypertension secondary to endocrine disorders: Secondary | ICD-10-CM | POA: Diagnosis present

## 2017-08-08 DIAGNOSIS — E1165 Type 2 diabetes mellitus with hyperglycemia: Secondary | ICD-10-CM

## 2017-08-08 DIAGNOSIS — I10 Essential (primary) hypertension: Secondary | ICD-10-CM | POA: Diagnosis present

## 2017-08-08 DIAGNOSIS — F329 Major depressive disorder, single episode, unspecified: Secondary | ICD-10-CM | POA: Diagnosis present

## 2017-08-08 DIAGNOSIS — Z886 Allergy status to analgesic agent status: Secondary | ICD-10-CM

## 2017-08-08 DIAGNOSIS — N186 End stage renal disease: Secondary | ICD-10-CM | POA: Diagnosis not present

## 2017-08-08 DIAGNOSIS — E875 Hyperkalemia: Secondary | ICD-10-CM | POA: Diagnosis not present

## 2017-08-08 DIAGNOSIS — K858 Other acute pancreatitis without necrosis or infection: Secondary | ICD-10-CM | POA: Diagnosis not present

## 2017-08-08 DIAGNOSIS — R5383 Other fatigue: Secondary | ICD-10-CM | POA: Diagnosis not present

## 2017-08-08 DIAGNOSIS — R1013 Epigastric pain: Secondary | ICD-10-CM | POA: Diagnosis present

## 2017-08-08 DIAGNOSIS — G8929 Other chronic pain: Secondary | ICD-10-CM | POA: Diagnosis present

## 2017-08-08 DIAGNOSIS — R079 Chest pain, unspecified: Secondary | ICD-10-CM | POA: Diagnosis present

## 2017-08-08 DIAGNOSIS — K219 Gastro-esophageal reflux disease without esophagitis: Secondary | ICD-10-CM | POA: Diagnosis present

## 2017-08-08 DIAGNOSIS — I700 Atherosclerosis of aorta: Secondary | ICD-10-CM | POA: Diagnosis present

## 2017-08-08 DIAGNOSIS — K859 Acute pancreatitis without necrosis or infection, unspecified: Secondary | ICD-10-CM | POA: Diagnosis present

## 2017-08-08 DIAGNOSIS — G4733 Obstructive sleep apnea (adult) (pediatric): Secondary | ICD-10-CM | POA: Diagnosis present

## 2017-08-08 DIAGNOSIS — R748 Abnormal levels of other serum enzymes: Secondary | ICD-10-CM | POA: Diagnosis present

## 2017-08-08 DIAGNOSIS — R188 Other ascites: Secondary | ICD-10-CM | POA: Diagnosis not present

## 2017-08-08 DIAGNOSIS — R1902 Left upper quadrant abdominal swelling, mass and lump: Secondary | ICD-10-CM | POA: Diagnosis present

## 2017-08-08 DIAGNOSIS — I82409 Acute embolism and thrombosis of unspecified deep veins of unspecified lower extremity: Secondary | ICD-10-CM | POA: Diagnosis present

## 2017-08-08 DIAGNOSIS — I429 Cardiomyopathy, unspecified: Secondary | ICD-10-CM | POA: Diagnosis present

## 2017-08-08 DIAGNOSIS — K861 Other chronic pancreatitis: Secondary | ICD-10-CM | POA: Diagnosis not present

## 2017-08-08 DIAGNOSIS — Z992 Dependence on renal dialysis: Secondary | ICD-10-CM

## 2017-08-08 DIAGNOSIS — Z7901 Long term (current) use of anticoagulants: Secondary | ICD-10-CM

## 2017-08-08 DIAGNOSIS — E1122 Type 2 diabetes mellitus with diabetic chronic kidney disease: Secondary | ICD-10-CM | POA: Diagnosis present

## 2017-08-08 DIAGNOSIS — R3989 Other symptoms and signs involving the genitourinary system: Secondary | ICD-10-CM | POA: Diagnosis present

## 2017-08-08 DIAGNOSIS — IMO0002 Reserved for concepts with insufficient information to code with codable children: Secondary | ICD-10-CM | POA: Diagnosis present

## 2017-08-08 DIAGNOSIS — E8809 Other disorders of plasma-protein metabolism, not elsewhere classified: Secondary | ICD-10-CM | POA: Diagnosis present

## 2017-08-08 DIAGNOSIS — E1129 Type 2 diabetes mellitus with other diabetic kidney complication: Secondary | ICD-10-CM | POA: Diagnosis present

## 2017-08-08 DIAGNOSIS — K567 Ileus, unspecified: Secondary | ICD-10-CM | POA: Diagnosis not present

## 2017-08-08 DIAGNOSIS — E785 Hyperlipidemia, unspecified: Secondary | ICD-10-CM | POA: Diagnosis present

## 2017-08-08 DIAGNOSIS — R197 Diarrhea, unspecified: Secondary | ICD-10-CM

## 2017-08-08 DIAGNOSIS — N2581 Secondary hyperparathyroidism of renal origin: Secondary | ICD-10-CM | POA: Diagnosis not present

## 2017-08-08 DIAGNOSIS — D631 Anemia in chronic kidney disease: Secondary | ICD-10-CM | POA: Diagnosis present

## 2017-08-08 DIAGNOSIS — Z86718 Personal history of other venous thrombosis and embolism: Secondary | ICD-10-CM

## 2017-08-08 DIAGNOSIS — G4731 Primary central sleep apnea: Secondary | ICD-10-CM

## 2017-08-08 DIAGNOSIS — M25512 Pain in left shoulder: Secondary | ICD-10-CM

## 2017-08-08 DIAGNOSIS — I132 Hypertensive heart and chronic kidney disease with heart failure and with stage 5 chronic kidney disease, or end stage renal disease: Secondary | ICD-10-CM | POA: Diagnosis present

## 2017-08-08 DIAGNOSIS — F419 Anxiety disorder, unspecified: Secondary | ICD-10-CM | POA: Diagnosis present

## 2017-08-08 DIAGNOSIS — K863 Pseudocyst of pancreas: Secondary | ICD-10-CM | POA: Diagnosis present

## 2017-08-08 DIAGNOSIS — R740 Nonspecific elevation of levels of transaminase and lactic acid dehydrogenase [LDH]: Secondary | ICD-10-CM | POA: Diagnosis present

## 2017-08-08 DIAGNOSIS — R112 Nausea with vomiting, unspecified: Secondary | ICD-10-CM | POA: Diagnosis present

## 2017-08-08 DIAGNOSIS — F1210 Cannabis abuse, uncomplicated: Secondary | ICD-10-CM | POA: Diagnosis present

## 2017-08-08 DIAGNOSIS — Z87891 Personal history of nicotine dependence: Secondary | ICD-10-CM

## 2017-08-08 DIAGNOSIS — R0602 Shortness of breath: Secondary | ICD-10-CM | POA: Diagnosis not present

## 2017-08-08 DIAGNOSIS — R0600 Dyspnea, unspecified: Secondary | ICD-10-CM | POA: Diagnosis present

## 2017-08-08 DIAGNOSIS — G894 Chronic pain syndrome: Secondary | ICD-10-CM | POA: Diagnosis present

## 2017-08-08 DIAGNOSIS — Z888 Allergy status to other drugs, medicaments and biological substances status: Secondary | ICD-10-CM

## 2017-08-08 DIAGNOSIS — Z86711 Personal history of pulmonary embolism: Secondary | ICD-10-CM

## 2017-08-08 HISTORY — DX: Acute pulmonary edema: J81.0

## 2017-08-08 HISTORY — DX: Hypoxemia: R09.02

## 2017-08-08 HISTORY — DX: Other symptoms and signs involving the genitourinary system: R39.89

## 2017-08-08 HISTORY — DX: Cyst of kidney, acquired: N28.1

## 2017-08-08 LAB — CBC WITH DIFFERENTIAL/PLATELET
Basophils Absolute: 0 10*3/uL (ref 0.0–0.1)
Basophils Relative: 0 %
Eosinophils Absolute: 0.1 10*3/uL (ref 0.0–0.7)
Eosinophils Relative: 3 %
HEMATOCRIT: 31.5 % — AB (ref 39.0–52.0)
Hemoglobin: 10.6 g/dL — ABNORMAL LOW (ref 13.0–17.0)
LYMPHS PCT: 17 %
Lymphs Abs: 0.8 10*3/uL (ref 0.7–4.0)
MCH: 29.1 pg (ref 26.0–34.0)
MCHC: 33.7 g/dL (ref 30.0–36.0)
MCV: 86.5 fL (ref 78.0–100.0)
MONO ABS: 0.4 10*3/uL (ref 0.1–1.0)
MONOS PCT: 8 %
NEUTROS ABS: 3.5 10*3/uL (ref 1.7–7.7)
Neutrophils Relative %: 72 %
Platelets: 147 10*3/uL — ABNORMAL LOW (ref 150–400)
RBC: 3.64 MIL/uL — ABNORMAL LOW (ref 4.22–5.81)
RDW: 18.3 % — AB (ref 11.5–15.5)
WBC: 4.8 10*3/uL (ref 4.0–10.5)

## 2017-08-08 LAB — COMPREHENSIVE METABOLIC PANEL
ALK PHOS: 138 U/L — AB (ref 38–126)
ALT: 60 U/L (ref 17–63)
ANION GAP: 17 — AB (ref 5–15)
AST: 86 U/L — AB (ref 15–41)
Albumin: 3.1 g/dL — ABNORMAL LOW (ref 3.5–5.0)
BILIRUBIN TOTAL: 1.7 mg/dL — AB (ref 0.3–1.2)
BUN: 39 mg/dL — AB (ref 6–20)
CALCIUM: 9.3 mg/dL (ref 8.9–10.3)
CO2: 27 mmol/L (ref 22–32)
Chloride: 93 mmol/L — ABNORMAL LOW (ref 101–111)
Creatinine, Ser: 7.53 mg/dL — ABNORMAL HIGH (ref 0.61–1.24)
GFR calc Af Amer: 8 mL/min — ABNORMAL LOW (ref >60)
GFR, EST NON AFRICAN AMERICAN: 7 mL/min — AB (ref >60)
Glucose, Bld: 74 mg/dL (ref 65–99)
POTASSIUM: 4.3 mmol/L (ref 3.5–5.1)
Sodium: 137 mmol/L (ref 135–145)
TOTAL PROTEIN: 7.5 g/dL (ref 6.5–8.1)

## 2017-08-08 LAB — TROPONIN I
Troponin I: 0.09 ng/mL (ref <0.03)
Troponin I: 0.09 ng/mL (ref <0.03)

## 2017-08-08 LAB — LIPASE, BLOOD: LIPASE: 32 U/L (ref 11–51)

## 2017-08-08 MED ORDER — ALTEPLASE 2 MG IJ SOLR
2.00 | INTRAMUSCULAR | Status: DC
Start: ? — End: 2017-08-08

## 2017-08-08 MED ORDER — ONDANSETRON HCL 4 MG/2ML IJ SOLN
4.0000 mg | Freq: Once | INTRAMUSCULAR | Status: AC
  Administered 2017-08-08: 4 mg via INTRAVENOUS
  Filled 2017-08-08: qty 2

## 2017-08-08 MED ORDER — LABETALOL HCL 5 MG/ML IV SOLN
10.0000 mg | Freq: Once | INTRAVENOUS | Status: AC
  Administered 2017-08-08: 10 mg via INTRAVENOUS
  Filled 2017-08-08: qty 4

## 2017-08-08 MED ORDER — HYDROMORPHONE HCL 1 MG/ML IJ SOLN
0.5000 mg | Freq: Once | INTRAMUSCULAR | Status: AC
  Administered 2017-08-08: 0.5 mg via INTRAVENOUS
  Filled 2017-08-08: qty 1

## 2017-08-08 MED ORDER — LIDOCAINE HCL (PF) 1 % IJ SOLN
.10 | INTRAMUSCULAR | Status: DC
Start: ? — End: 2017-08-08

## 2017-08-08 MED ORDER — SODIUM CHLORIDE 0.9 % IV SOLN
150.00 | INTRAVENOUS | Status: DC
Start: ? — End: 2017-08-08

## 2017-08-08 MED ORDER — SODIUM CHLORIDE 0.9 % IJ SOLN
50.00 | INTRAMUSCULAR | Status: DC
Start: ? — End: 2017-08-08

## 2017-08-08 MED ORDER — HYDRALAZINE HCL 20 MG/ML IJ SOLN
10.0000 mg | Freq: Once | INTRAMUSCULAR | Status: AC
  Administered 2017-08-08: 10 mg via INTRAVENOUS
  Filled 2017-08-08: qty 1

## 2017-08-08 MED ORDER — SODIUM CHLORIDE 0.9 % IV BOLUS (SEPSIS)
250.0000 mL | Freq: Once | INTRAVENOUS | Status: AC
  Administered 2017-08-08: 250 mL via INTRAVENOUS

## 2017-08-08 MED ORDER — HYDROMORPHONE HCL 1 MG/ML IJ SOLN
1.0000 mg | Freq: Once | INTRAMUSCULAR | Status: AC
  Administered 2017-08-08: 1 mg via INTRAVENOUS
  Filled 2017-08-08: qty 1

## 2017-08-08 MED ORDER — ALBUMIN HUMAN 25 % IV SOLN
12.50 | INTRAVENOUS | Status: DC
Start: ? — End: 2017-08-08

## 2017-08-08 NOTE — ED Notes (Signed)
This RN in room to reassess pt's pain. Pt found to be asleep. Pt awakened to assess pain and pt states "my pain is moving around now. I tell you what we can try for my pain though, can you get me a meal tray?" Dr Ellender Hose notified and pt can eat. Pt offered [place] sandwich and pt declined stating "I would rather have a meal tray" Pt advised that he cannot get a meal tray due to being discharge. Pt declined food down here.

## 2017-08-08 NOTE — ED Notes (Signed)
Patient desats when he is asleep. 84-87% on room air when he sleep. 2lit O2 [place] applied

## 2017-08-08 NOTE — ED Triage Notes (Signed)
Patient arrived via [facility]. Report to have been transported from Dialysis. Patient was reporting chest pain and abdominal pain

## 2017-08-08 NOTE — ED Notes (Signed)
Patient transported to CT 
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
## 2017-08-09 ENCOUNTER — Encounter (HOSPITAL_COMMUNITY): Payer: Self-pay | Admitting: Family Medicine

## 2017-08-09 DIAGNOSIS — K861 Other chronic pancreatitis: Secondary | ICD-10-CM | POA: Insufficient documentation

## 2017-08-09 DIAGNOSIS — I5042 Chronic combined systolic (congestive) and diastolic (congestive) heart failure: Secondary | ICD-10-CM | POA: Diagnosis present

## 2017-08-09 DIAGNOSIS — N186 End stage renal disease: Secondary | ICD-10-CM

## 2017-08-09 DIAGNOSIS — K863 Pseudocyst of pancreas: Secondary | ICD-10-CM | POA: Diagnosis not present

## 2017-08-09 DIAGNOSIS — R791 Abnormal coagulation profile: Secondary | ICD-10-CM | POA: Diagnosis present

## 2017-08-09 DIAGNOSIS — M25512 Pain in left shoulder: Secondary | ICD-10-CM

## 2017-08-09 DIAGNOSIS — R3989 Other symptoms and signs involving the genitourinary system: Secondary | ICD-10-CM | POA: Diagnosis present

## 2017-08-09 DIAGNOSIS — F419 Anxiety disorder, unspecified: Secondary | ICD-10-CM | POA: Diagnosis present

## 2017-08-09 DIAGNOSIS — I700 Atherosclerosis of aorta: Secondary | ICD-10-CM | POA: Diagnosis not present

## 2017-08-09 DIAGNOSIS — Z87891 Personal history of nicotine dependence: Secondary | ICD-10-CM | POA: Diagnosis not present

## 2017-08-09 DIAGNOSIS — D631 Anemia in chronic kidney disease: Secondary | ICD-10-CM

## 2017-08-09 DIAGNOSIS — R1902 Left upper quadrant abdominal swelling, mass and lump: Secondary | ICD-10-CM | POA: Diagnosis present

## 2017-08-09 DIAGNOSIS — E8809 Other disorders of plasma-protein metabolism, not elsewhere classified: Secondary | ICD-10-CM

## 2017-08-09 DIAGNOSIS — N2581 Secondary hyperparathyroidism of renal origin: Secondary | ICD-10-CM | POA: Diagnosis not present

## 2017-08-09 DIAGNOSIS — N189 Chronic kidney disease, unspecified: Secondary | ICD-10-CM

## 2017-08-09 DIAGNOSIS — Z7901 Long term (current) use of anticoagulants: Secondary | ICD-10-CM | POA: Diagnosis not present

## 2017-08-09 DIAGNOSIS — G4733 Obstructive sleep apnea (adult) (pediatric): Secondary | ICD-10-CM | POA: Diagnosis present

## 2017-08-09 DIAGNOSIS — K219 Gastro-esophageal reflux disease without esophagitis: Secondary | ICD-10-CM

## 2017-08-09 DIAGNOSIS — I429 Cardiomyopathy, unspecified: Secondary | ICD-10-CM | POA: Diagnosis present

## 2017-08-09 DIAGNOSIS — Z992 Dependence on renal dialysis: Secondary | ICD-10-CM

## 2017-08-09 DIAGNOSIS — E1165 Type 2 diabetes mellitus with hyperglycemia: Secondary | ICD-10-CM

## 2017-08-09 DIAGNOSIS — R748 Abnormal levels of other serum enzymes: Secondary | ICD-10-CM | POA: Diagnosis present

## 2017-08-09 DIAGNOSIS — G894 Chronic pain syndrome: Secondary | ICD-10-CM

## 2017-08-09 DIAGNOSIS — K859 Acute pancreatitis without necrosis or infection, unspecified: Secondary | ICD-10-CM

## 2017-08-09 DIAGNOSIS — F329 Major depressive disorder, single episode, unspecified: Secondary | ICD-10-CM | POA: Diagnosis present

## 2017-08-09 DIAGNOSIS — E1122 Type 2 diabetes mellitus with diabetic chronic kidney disease: Secondary | ICD-10-CM | POA: Diagnosis not present

## 2017-08-09 DIAGNOSIS — E1129 Type 2 diabetes mellitus with other diabetic kidney complication: Secondary | ICD-10-CM | POA: Diagnosis not present

## 2017-08-09 DIAGNOSIS — I120 Hypertensive chronic kidney disease with stage 5 chronic kidney disease or end stage renal disease: Secondary | ICD-10-CM | POA: Diagnosis not present

## 2017-08-09 DIAGNOSIS — R1013 Epigastric pain: Secondary | ICD-10-CM

## 2017-08-09 DIAGNOSIS — E785 Hyperlipidemia, unspecified: Secondary | ICD-10-CM | POA: Diagnosis present

## 2017-08-09 DIAGNOSIS — I82409 Acute embolism and thrombosis of unspecified deep veins of unspecified lower extremity: Secondary | ICD-10-CM

## 2017-08-09 DIAGNOSIS — R0789 Other chest pain: Secondary | ICD-10-CM | POA: Diagnosis not present

## 2017-08-09 DIAGNOSIS — R933 Abnormal findings on diagnostic imaging of other parts of digestive tract: Secondary | ICD-10-CM | POA: Diagnosis not present

## 2017-08-09 DIAGNOSIS — R740 Nonspecific elevation of levels of transaminase and lactic acid dehydrogenase [LDH]: Secondary | ICD-10-CM | POA: Diagnosis present

## 2017-08-09 DIAGNOSIS — G8929 Other chronic pain: Secondary | ICD-10-CM

## 2017-08-09 DIAGNOSIS — Z86718 Personal history of other venous thrombosis and embolism: Secondary | ICD-10-CM | POA: Diagnosis not present

## 2017-08-09 DIAGNOSIS — I132 Hypertensive heart and chronic kidney disease with heart failure and with stage 5 chronic kidney disease, or end stage renal disease: Secondary | ICD-10-CM | POA: Diagnosis present

## 2017-08-09 DIAGNOSIS — Z86711 Personal history of pulmonary embolism: Secondary | ICD-10-CM | POA: Diagnosis not present

## 2017-08-09 HISTORY — DX: Pain in left shoulder: M25.512

## 2017-08-09 HISTORY — DX: Other chronic pancreatitis: K86.1

## 2017-08-09 HISTORY — DX: Other disorders of plasma-protein metabolism, not elsewhere classified: E88.09

## 2017-08-09 HISTORY — DX: Acute pancreatitis without necrosis or infection, unspecified: K85.90

## 2017-08-09 HISTORY — DX: Other symptoms and signs involving the genitourinary system: R39.89

## 2017-08-09 HISTORY — DX: Other chronic pain: G89.29

## 2017-08-09 HISTORY — DX: Left upper quadrant abdominal swelling, mass and lump: R19.02

## 2017-08-09 LAB — LIPID PANEL
CHOL/HDL RATIO: 3.8 ratio
CHOLESTEROL: 92 mg/dL (ref 0–200)
HDL: 24 mg/dL — ABNORMAL LOW (ref >40)
LDL CALC: 53 mg/dL (ref 0–99)
Triglycerides: 77 mg/dL (ref <150)
VLDL: 15 mg/dL (ref 0–40)

## 2017-08-09 LAB — BASIC METABOLIC PANEL
ANION GAP: 18 — AB (ref 5–15)
BUN: 46 mg/dL — ABNORMAL HIGH (ref 6–20)
CO2: 23 mmol/L (ref 22–32)
Calcium: 9.3 mg/dL (ref 8.9–10.3)
Chloride: 94 mmol/L — ABNORMAL LOW (ref 101–111)
Creatinine, Ser: 8.64 mg/dL — ABNORMAL HIGH (ref 0.61–1.24)
GFR, EST AFRICAN AMERICAN: 7 mL/min — AB (ref >60)
GFR, EST NON AFRICAN AMERICAN: 6 mL/min — AB (ref >60)
Glucose, Bld: 54 mg/dL — ABNORMAL LOW (ref 65–99)
POTASSIUM: 5.1 mmol/L (ref 3.5–5.1)
Sodium: 135 mmol/L (ref 135–145)

## 2017-08-09 LAB — HEPATIC FUNCTION PANEL
ALBUMIN: 2.9 g/dL — AB (ref 3.5–5.0)
ALK PHOS: 117 U/L (ref 38–126)
ALT: 58 U/L (ref 17–63)
AST: 75 U/L — ABNORMAL HIGH (ref 15–41)
BILIRUBIN TOTAL: 1.9 mg/dL — AB (ref 0.3–1.2)
Bilirubin, Direct: 0.6 mg/dL — ABNORMAL HIGH (ref 0.1–0.5)
Indirect Bilirubin: 1.3 mg/dL — ABNORMAL HIGH (ref 0.3–0.9)
TOTAL PROTEIN: 7.1 g/dL (ref 6.5–8.1)

## 2017-08-09 LAB — CBC
HEMATOCRIT: 31.9 % — AB (ref 39.0–52.0)
HEMOGLOBIN: 10.5 g/dL — AB (ref 13.0–17.0)
MCH: 28.6 pg (ref 26.0–34.0)
MCHC: 32.9 g/dL (ref 30.0–36.0)
MCV: 86.9 fL (ref 78.0–100.0)
Platelets: 140 10*3/uL — ABNORMAL LOW (ref 150–400)
RBC: 3.67 MIL/uL — AB (ref 4.22–5.81)
RDW: 18.5 % — ABNORMAL HIGH (ref 11.5–15.5)
WBC: 5.1 10*3/uL (ref 4.0–10.5)

## 2017-08-09 LAB — TROPONIN I: Troponin I: 0.09 ng/mL (ref <0.03)

## 2017-08-09 LAB — PROTIME-INR
INR: 1.59
Prothrombin Time: 18.8 seconds — ABNORMAL HIGH (ref 11.4–15.2)

## 2017-08-09 LAB — MRSA PCR SCREENING: MRSA by PCR: NEGATIVE

## 2017-08-09 MED ORDER — WARFARIN SODIUM 7.5 MG PO TABS
7.5000 mg | ORAL_TABLET | Freq: Once | ORAL | Status: AC
  Administered 2017-08-09: 7.5 mg via ORAL
  Filled 2017-08-09: qty 1

## 2017-08-09 MED ORDER — CLONIDINE HCL 0.3 MG/24HR TD PTWK: 0.3000 mg | MEDICATED_PATCH | TRANSDERMAL | Status: DC

## 2017-08-09 MED ORDER — NEPRO/CARBSTEADY PO LIQD: 237.0000 mL | Freq: Three times a day (TID) | ORAL | Status: DC

## 2017-08-09 MED ORDER — ISOSORBIDE MONONITRATE ER 60 MG PO TB24
60.0000 mg | ORAL_TABLET | Freq: Every day | ORAL | Status: DC
  Administered 2017-08-09 – 2017-08-12 (×4): 60 mg via ORAL
  Filled 2017-08-09 (×2): qty 1
  Filled 2017-08-09: qty 2
  Filled 2017-08-09: qty 1

## 2017-08-09 MED ORDER — HEPARIN (PORCINE) IN NACL 100-0.45 UNIT/ML-% IJ SOLN
1850.0000 [IU]/h | INTRAMUSCULAR | Status: DC
  Administered 2017-08-09: 1350 [IU]/h via INTRAVENOUS
  Administered 2017-08-10: 1450 [IU]/h via INTRAVENOUS
  Administered 2017-08-11: 1800 [IU]/h via INTRAVENOUS
  Administered 2017-08-11: 1600 [IU]/h via INTRAVENOUS
  Filled 2017-08-09 (×6): qty 250

## 2017-08-09 MED ORDER — PANTOPRAZOLE SODIUM 40 MG IV SOLR
40.0000 mg | INTRAVENOUS | Status: DC
  Administered 2017-08-09: 40 mg via INTRAVENOUS
  Filled 2017-08-09: qty 40

## 2017-08-09 MED ORDER — TRAMADOL HCL 50 MG PO TABS
25.0000 mg | ORAL_TABLET | Freq: Two times a day (BID) | ORAL | Status: DC | PRN
  Filled 2017-08-09 (×2): qty 1

## 2017-08-09 MED ORDER — WARFARIN - PHARMACIST DOSING INPATIENT: Freq: Every day | Status: DC

## 2017-08-09 MED ORDER — HYDRALAZINE HCL 20 MG/ML IJ SOLN
5.0000 mg | Freq: Four times a day (QID) | INTRAMUSCULAR | Status: DC | PRN
  Administered 2017-08-09: 5 mg via INTRAVENOUS
  Filled 2017-08-09: qty 1

## 2017-08-09 MED ORDER — ATORVASTATIN CALCIUM 10 MG PO TABS
10.0000 mg | ORAL_TABLET | Freq: Every day | ORAL | Status: DC
  Administered 2017-08-09 – 2017-08-11 (×3): 10 mg via ORAL
  Filled 2017-08-09 (×3): qty 1

## 2017-08-09 MED ORDER — DICYCLOMINE HCL 20 MG PO TABS
20.0000 mg | ORAL_TABLET | Freq: Four times a day (QID) | ORAL | Status: DC
  Administered 2017-08-09 – 2017-08-12 (×11): 20 mg via ORAL
  Filled 2017-08-09 (×13): qty 1

## 2017-08-09 MED ORDER — PROMETHAZINE HCL 25 MG PO TABS
12.5000 mg | ORAL_TABLET | Freq: Four times a day (QID) | ORAL | Status: DC | PRN
  Administered 2017-08-11 – 2017-08-12 (×2): 12.5 mg via ORAL
  Filled 2017-08-09 (×2): qty 1

## 2017-08-09 MED ORDER — NEPRO/CARBSTEADY PO LIQD
237.0000 mL | Freq: Three times a day (TID) | ORAL | Status: DC
  Administered 2017-08-09 – 2017-08-12 (×11): 237 mL via ORAL
  Filled 2017-08-09 (×13): qty 237

## 2017-08-09 MED ORDER — AMLODIPINE BESYLATE 10 MG PO TABS
10.0000 mg | ORAL_TABLET | Freq: Every day | ORAL | Status: DC
  Administered 2017-08-10: 10 mg via ORAL
  Filled 2017-08-09 (×2): qty 1

## 2017-08-09 MED ORDER — SEVELAMER CARBONATE 800 MG PO TABS
2400.0000 mg | ORAL_TABLET | Freq: Three times a day (TID) | ORAL | Status: DC
  Administered 2017-08-09 – 2017-08-12 (×9): 2400 mg via ORAL
  Filled 2017-08-09 (×12): qty 3

## 2017-08-09 MED ORDER — PANTOPRAZOLE SODIUM 40 MG PO TBEC
40.0000 mg | DELAYED_RELEASE_TABLET | Freq: Every day | ORAL | Status: DC
  Administered 2017-08-09 – 2017-08-12 (×4): 40 mg via ORAL
  Filled 2017-08-09 (×4): qty 1

## 2017-08-09 MED ORDER — POLYETHYLENE GLYCOL 3350 17 G PO PACK: 17.0000 g | PACK | Freq: Every day | ORAL | Status: DC | PRN

## 2017-08-09 MED ORDER — OXYCODONE HCL 5 MG PO TABS
5.0000 mg | ORAL_TABLET | Freq: Once | ORAL | Status: AC
  Administered 2017-08-09: 5 mg via ORAL
  Filled 2017-08-09: qty 1

## 2017-08-09 MED ORDER — DOXAZOSIN MESYLATE 2 MG PO TABS
4.0000 mg | ORAL_TABLET | Freq: Every day | ORAL | Status: DC
  Administered 2017-08-10: 4 mg via ORAL
  Filled 2017-08-09 (×2): qty 2
  Filled 2017-08-09: qty 1

## 2017-08-09 MED ORDER — CARVEDILOL 25 MG PO TABS
25.0000 mg | ORAL_TABLET | Freq: Two times a day (BID) | ORAL | Status: DC
  Administered 2017-08-09 – 2017-08-12 (×7): 25 mg via ORAL
  Filled 2017-08-09: qty 2
  Filled 2017-08-09 (×5): qty 1
  Filled 2017-08-09: qty 2

## 2017-08-09 NOTE — ED Notes (Signed)
Renal Diet was ordered for Lunch.

## 2017-08-09 NOTE — Progress Notes (Signed)
ANTICOAGULATION CONSULT NOTE - Initial Consult  Pharmacy Consult for Warfarin  Indication: History of DVT/PE  Allergies  Allergen Reactions  . Butalbital-Apap-Caffeine Shortness Of Breath, Swelling and Other (See Comments)    Swelling in throat  . Ferrlecit [Na Ferric Gluc Cplx In Sucrose] Shortness Of Breath, Swelling and Other (See Comments)    Swelling in throat, tolerates Venofor  . Minoxidil Shortness Of Breath  . Tylenol [Acetaminophen] Anaphylaxis and Swelling  . Darvocet [Propoxyphene N-Acetaminophen] Hives    Patient Measurements: Height: _0  (188 cm) IBW/kg (Calculated) : 82.2  Vital Signs: Temp: 98.2 F (36.8 C) (01/18 1648) Temp Source: Oral (01/18 1648) BP: 165/105 (01/19 0200) Pulse Rate: 90 (01/19 0200)  Labs: Recent Labs    08/08/17 1720 08/08/17 2155  HGB 10.6*  --   HCT 31.5*  --   PLT 147*  --   CREATININE 7.53*  --   TROPONINI 0.09* 0.09*    Estimated Creatinine Clearance: 12.6 mL/min (A) (by C-G formula based on SCr of 7.53 mg/dL (H)).   Medical History: Past Medical History:  Diagnosis Date  . Anemia   . Anxiety   . Chronic combined systolic and diastolic CHF (congestive heart failure) (HCC)    a. EF 20-25% by echo in 08/2015 b. echo 10/2015: EF 35-40%, diffuse HK, severe LAE, moderate RAE, small pericardial effusion  . Complication of anesthesia    itching, sore throat  . Depression   . Dialysis patient ([place])   . DVT (deep venous thrombosis) ([place]) 02/2017  . ESRD (end stage renal disease) ([place])    due to HTN per patient, followed at [facility], s/p failed kidney transplant - dialysis Tue, Th, Sat  . Hyperkalemia 12/2015  . Hypertension   . Junctional rhythm    a. noted in 08/2015: hyperkalemic at that time  b. 12/2015: presented in junctional rhythm w/ K+ of 6.6. Resolved with improvement of K+ levels.  . Nonischemic cardiomyopathy ([place])    a. 08/2014: cath showing minimal CAD, but tortuous arteries noted.   . Personal history of DVT  (deep vein thrombosis)/ PE 05/26/2016   In Oct 2015 had small subsemental LUL PE w/o DVT (LE dopplers neg) and was felt to be HD cath related, treated w coumadin.  IN May 2016 had small vein DVT (acute/subacute) in the R basilic/ brachial veins of the RUE, resumed on coumadin.  Had R sided HD cath at that time.    . Renal insufficiency   . Shortness of breath   . Type II diabetes mellitus (HCC)    No history per patient, but remains under history as A1c would not be accurate given on dialysis   Assessment: 52 y/o M with hx of DVT/PE on warfarin PTA, presents to the ED with abdominal pain, ESRD on HD, no INR has been drawn yet  Goal of Therapy:  INR 2-3 Monitor platelets by anticoagulation protocol: Yes   Plan:  -INR with AM labs to assess dosing needs  Narda Bonds 08/09/2017,2:37 AM
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
## 2017-08-09 NOTE — ED Notes (Signed)
Pt states he wants to wait to take Feeding Supplement when breakfast tray arrives. Pt noted to be lying on bed, alert. Monitor intact to pt.

## 2017-08-09 NOTE — ED Notes (Signed)
Pt asking for pain med. Attempted to administer Ultram - pt refused - states "I want something better than that. I'll get something later.

## 2017-08-09 NOTE — ED Notes (Signed)
Monitor applied to pt by Lavena Stanford, EMT. SR. Pt's BP remains elevated. Pt noted to be irritable. Remote to tv given as requested. Lights turned out as requested. Pt asking to eat/drink repeatedly - requesting meal and apple juice. Advised pt NPO. Pt given ice chips.

## 2017-08-09 NOTE — ED Notes (Addendum)
Pt aware of delay w/bed assignment. Pt given 2nd gown to place around back side as requested.

## 2017-08-09 NOTE — Progress Notes (Signed)
Patient ID: Frank [name], male   DOB: [birth year], 52 y.o.   MRN: [id number] Called by Family Practice to evaluate the enlarging fluid collection in left upper abdomen.  This area is difficult to evaluate without IV contrast.  It could represent a pseudocyst.  The abdomen and this structure could be better characterize with a post contrast CT.  If the structure is suggestive for a pseudocyst, would consider internal drainage such as a cystogastrostomy rather than an external drain.

## 2017-08-09 NOTE — ED Notes (Signed)
Pt given warmed [place] sandwich and apple juice as requested. Pt c/o sandwich was not warm enough but he would eat it and stated "I need ice for my apple juice" - given. Pt voiced appreciation.

## 2017-08-09 NOTE — Progress Notes (Signed)
[name]: [phone]  Patient name: Frank [name] Medical record number: [id number] Date of birth: [birth year] Age: 52 y.o. Gender: male  Primary Care Provider: Lolita Patella, MD Consultants: GI Code Status: Full code  Pt Overview and Major Events to Date:  1/18: Admitted with abdominal pain  Assessment and Plan: Frank [name] is a 52 y.o. male presenting with progressive epigastric pain, chest pain, N/V for the past few days. PMH is significant for ESRD, HTN, HFrEF, NICM,DVT/PE, anxiety and depression, chronic pain syndrome, anemia of chronic disease, GERD, chronic pancreatitis, HLD, OSA, marijuana abuse.  Abdominal pain: Likely due to acute on chronic pancreatitis. Lipase within normal limits but patient with chronic pancreatitis. CT abdomen with enlarged pancreatic pseudocyst from prior. There is also bowel air-fluid levels on abdominal series in ED concerning for ileus in the RLQ but not evidenced by CT. continues to complain epigastric pain radiating to his left chest. - Consulted GI for enlarging pseudocyst.  - IR not interested but suggested getting a CT abdomen with contrast for better assessment of his pseudocyst.  - Check lipid panel - Vitals per floor routine - PO Protonix - K pad - Tramadol 34m q12h PRN - Phenergan PRN nausea  - Renal diet  Chest Pain/epigastric pain: Partly musculoskeletal.  Chest pain is reproducible.  It could also be due to his enlarging pseudocyst.  EKG unchanged from prior. Troponin mildly elevated to 0.09 but stable.  He is ESRD patient.  Wells score 0 for PE. -Continue home meds (Coreg, Imdur) -Add atorvastatin  NICM/HFrEF: Last ECHO 10/2015 with EF 35-40%, diffuse hypokinesis, severe atrial enlargement. Does not appear fluid overloaded on exam. - Otherwise plan as noted above - continue home meds (see below)  ESRD (MWF): [facility] history of noncompliance. - nephro  following  Mild Transaminitis: AST 86. History of chronic intermittent elevations. Denies history of alcohol use.  - Monitor for now  - if elevates, can consider further evaluation   HTN: Mildly elevated blood pressures.  Likely due to poor compliance with medication.  Supposedly on Coreg 215m Clonidine 0.37m74match (weekly), Cardura 4mg62mmdur 60mg25mhome. - continue home meds - IV hyralazine 5mg q14mprn for SBP >180, DBP >110  DVT/PE:Chronic. Multiple known VTE. INR was subtherapeutic likely due to poor compliance with his warfarin. - PT/INR - Warfarin per pharmacy  Anxiety and depression:Chronic.Stable. - Will monitor,holding home amitriptyline.  Anemia of chronic disease:Chronic. Hgb 10.6 (at b/l). No signs of active bleeding, denies blood in vomit. Platelets stable at 147. - Monitor CBC  GERD:Chronic. On Protonix 40mg B37m- PO Protonix 40mg da47m OSA:ChroEHO:ZYYQMGNlly adherent to CPAP. - CPAP nightly  Marijuana abuse:Chronic. Denies recent use.  FEN/GI:  -Renal diet with fluid restriction.  Prophylaxis: On Coumadin for history of DVT/PE  Disposition: Continue inpatient management.  Awaiting GI input.  Subjective:  Continues to complain epigastric pain radiating to his left chest.  He describes the pain as achiness.  Pain worse after eating sandwich.   Objective: Temp:  [98 F (36.7 C)-98.2 F (36.8 C)] 98 F (36.7 C) (01/19 0811) Pulse Rate:  [80-105] 82 (01/19 1016) Resp:  [0-29] 20 (01/19 1016) BP: (137-188)/(91-138) 137/91 (01/19 1016) SpO2:  [81 %-100 %] 96 % (01/19 1016) Physical Exam: GEN: Lying in bed comfortably watching TV CVS: RRR, nl s1 & s2, no murmurs, no edema RESP: no IWOB, good air movement bilaterally, CTAB GI: BS  present & normal, soft, tenderness to palpation over epigastric areas.  Not distractible MSK: Tenderness to palpation over his chest bilaterally SKIN: no apparent skin lesion NEURO: alert and oiented  appropriately, no gross deficits  Laboratory: Recent Labs  Lab 08/08/17 1720 08/09/17 0415  WBC 4.8 5.1  HGB 10.6* 10.5*  HCT 31.5* 31.9*  PLT 147* 140*   Recent Labs  Lab 08/08/17 1720 08/09/17 0415  NA 137 135  K 4.3 5.1  CL 93* 94*  CO2 27 23  BUN 39* 46*  CREATININE 7.53* 8.64*  CALCIUM 9.3 9.3  PROT 7.5 7.1  BILITOT 1.7* 1.9*  ALKPHOS 138* 117  ALT 60 58  AST 86* 75*  GLUCOSE 74 54*    Imaging/Diagnostic Tests: Ct Abdomen Pelvis Wo Contrast  Result Date: 08/08/2017 CLINICAL DATA:  52 year old male with history of chest pain and abdominal pain. EXAM: CT ABDOMEN AND PELVIS WITHOUT CONTRAST TECHNIQUE: Multidetector CT imaging of the abdomen and pelvis was performed following the standard protocol without IV contrast. COMPARISON:  CT the abdomen and pelvis 06/24/2017. FINDINGS: Lower chest: Cardiomegaly. Small amount of pericardial fluid and/or thickening, unlikely to be of any hemodynamic significance at this time. No pericardial calcifications. Hepatobiliary: No definite cystic or solid hepatic lesions are confidently identified on today's noncontrast CT examination. Unenhanced appearance of the gallbladder is unremarkable. Pancreas: No definite pancreatic mass noted on today's noncontrast CT examination. Increasing fluid adjacent to the body and tail of the pancreas extending into the gastrosplenic ligament, currently measuring 8.0 x 10.7 x 7.6 cm (axial image 31 of series 3 and coronal image 74 of series 6), potentially an enlarging pancreatic pseudocyst. Spleen: Unremarkable. Adrenals/Urinary Tract: Severe atrophy of both kidneys. Low-attenuation lesions in the left kidney, incompletely characterized on today's noncontrast CT examination, largest of which measures up to 3.1 cm in diameter, likely to represent cysts. No hydroureteronephrosis. Urinary bladder is normal in appearance. Bilateral adrenal glands are normal in appearance. Stomach/Bowel: Normal appearance of the  stomach, with exception of some adjacent fluid in the gastrosplenic ligament (discussed above). No pathologic dilatation of small bowel or colon. The appendix is not confidently identified and may be surgically absent. Regardless, there are no inflammatory changes noted adjacent to the cecum to suggest the presence of an acute appendicitis at this time. Vascular/Lymphatic: Aortic atherosclerosis without evidence of aneurysm in the abdominal or pelvic vasculature. No lymphadenopathy noted in the abdomen or pelvis. Reproductive: Prostate gland and seminal vesicles are unremarkable in appearance. Other: Soft tissue mass in the left iliac fossa is presumably an old renal transplant, with extensive calcifications in the vascular pedicle, similar to prior studies. Trace volume of ascites. No pneumoperitoneum. Musculoskeletal: There are no aggressive appearing lytic or blastic lesions noted in the visualized portions of the skeleton. IMPRESSION: 1. Increasing fluid in the gastrosplenic ligament, suspicious for an enlarging pancreatic pseudocyst. Clinical correlation for signs and symptoms of pancreatitis is recommended. 2. Trace volume of ascites. 3. Cardiomegaly. 4. Aortic atherosclerosis. 5. Additional incidental findings, as above. Aortic Atherosclerosis (ICD10-I70.0). Electronically Signed   By: Vinnie Langton M.D.   On: 08/08/2017 20:56   Dg Abdomen Acute W/chest  Result Date: 08/08/2017 CLINICAL DATA:  Chest pain and abdominal pain. EXAM: DG ABDOMEN ACUTE W/ 1V CHEST COMPARISON:  Chest radiograph July 03, 2017. FINDINGS: The cardiac silhouette is moderately enlarged and unchanged. Calcified aortic knob. Pulmonary vascular congestion and diffuse interstitial prominence without pleural effusion or focal consolidation. No pneumothorax. Soft tissue planes and included osseous structures are non  suspicious. Small bowel air-fluid levels RIGHT lower quadrant. No small and large bowel distension. Severe aortoiliac  vascular calcifications. No intra-abdominal mass effect. Soft tissue planes and included osseous structures are non suspicious. IMPRESSION: Stable cardiomegaly and interstitial edema. Focal ileus RIGHT abdomen, less likely early versus nonacute bowel obstruction. Aortic Atherosclerosis (ICD10-I70.0). Electronically Signed   By: Elon Alas M.D.   On: 08/08/2017 18:38    Mercy Riding, MD 08/09/2017, 12:03 PM PGY-3, [facility] Intern pager: [phone], text pages welcome

## 2017-08-09 NOTE — ED Notes (Signed)
Phlebotomist attempted draw Lipid panel - unsuccessful. Unable to draw from SL. Philippa Chester, Phlebotomist, advised will attempt.

## 2017-08-09 NOTE — ED Notes (Signed)
Renal Diet ordered for Dinner.
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
## 2017-08-09 NOTE — ED Notes (Signed)
Patient denies pain and is resting comfortably.  

## 2017-08-09 NOTE — ED Notes (Signed)
Renal Diet was ordered for Breakfast.

## 2017-08-09 NOTE — ED Notes (Signed)
Warm blanket given as requested. Monitor remains intact to pt.

## 2017-08-09 NOTE — Progress Notes (Signed)
Patient says he takes lisinopril at home daily. When checked in his PTA medicines,it's not on the list.Patient was informed that it's not on the list of medicines that he takes at home. Patient insists he takes it daily,when asked what's the dosage,he says " I think it's 30 mg or 60 mg or 90 mg",then he said "it's 72 mg",call my kidney Doctor". MD on call notified. No new orders received. MD also made aware that patient refused vital signs to be checked. Patient scheduled for HD tonight. Tremond Shimabukuro, [name], Therapist, sports

## 2017-08-09 NOTE — ED Notes (Signed)
Pt to be escorted via w/c to 27M w/telemetry. All of pt's belongings at bedside placed in belongings bags - pt verified all belongings present - including pt's cell phone and charger. Pt's meds - Renvela x 3 doses from Pyxis daily meds sent w/pt.

## 2017-08-09 NOTE — ED Notes (Signed)
Pt ambulatory to nurses' desk - asking for phone to be charged. Phone charging at nurses' desk.

## 2017-08-09 NOTE — ED Notes (Signed)
Ice chips given as requested. Pt asking for apple juice - advised NPO.
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
## 2017-08-09 NOTE — Progress Notes (Signed)
New Admission Note:   Arrival Method: WC  Mental Orientation: A&O X4 Telemetry: Initiated Assessment: Completed Skin: WDL IV: WDL Pain: Denies Safety Measures: Safety Fall Prevention Plan has been given, discussed and signed Admission: Completed Unit Orientation: Patient has been orientated to the room, unit and staff.   Orders have been reviewed and implemented. Will continue to monitor the patient. Call light has been placed within reach and bed alarm has been activated.    Dixie Dials RN, BSN

## 2017-08-09 NOTE — ED Notes (Signed)
Pharmacy advised sending Nepro shortly. States was checked but not sent previously.
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
## 2017-08-10 LAB — CBC
HEMATOCRIT: 30 % — AB (ref 39.0–52.0)
Hemoglobin: 9.7 g/dL — ABNORMAL LOW (ref 13.0–17.0)
MCH: 28.4 pg (ref 26.0–34.0)
MCHC: 32.3 g/dL (ref 30.0–36.0)
MCV: 87.7 fL (ref 78.0–100.0)
PLATELETS: 116 10*3/uL — AB (ref 150–400)
RBC: 3.42 MIL/uL — ABNORMAL LOW (ref 4.22–5.81)
RDW: 18.8 % — AB (ref 11.5–15.5)
WBC: 4.5 10*3/uL (ref 4.0–10.5)

## 2017-08-10 LAB — RENAL FUNCTION PANEL
ALBUMIN: 2.7 g/dL — AB (ref 3.5–5.0)
Anion gap: 14 (ref 5–15)
BUN: 31 mg/dL — AB (ref 6–20)
CALCIUM: 8.9 mg/dL (ref 8.9–10.3)
CO2: 23 mmol/L (ref 22–32)
CREATININE: 6.96 mg/dL — AB (ref 0.61–1.24)
Chloride: 98 mmol/L — ABNORMAL LOW (ref 101–111)
GFR calc Af Amer: 9 mL/min — ABNORMAL LOW (ref >60)
GFR, EST NON AFRICAN AMERICAN: 8 mL/min — AB (ref >60)
Glucose, Bld: 135 mg/dL — ABNORMAL HIGH (ref 65–99)
PHOSPHORUS: 5.4 mg/dL — AB (ref 2.5–4.6)
POTASSIUM: 4.6 mmol/L (ref 3.5–5.1)
SODIUM: 135 mmol/L (ref 135–145)

## 2017-08-10 LAB — HEPARIN LEVEL (UNFRACTIONATED): Heparin Unfractionated: <0.1 IU/mL — ABNORMAL LOW (ref 0.30–0.70)

## 2017-08-10 LAB — PROTIME-INR
INR: 1.37
PROTHROMBIN TIME: 16.8 s — AB (ref 11.4–15.2)

## 2017-08-10 MED ORDER — SODIUM CHLORIDE 0.9 % IV SOLN: 100.0000 mL | INTRAVENOUS | Status: DC | PRN

## 2017-08-10 MED ORDER — ALTEPLASE 2 MG IJ SOLR: 2.0000 mg | Freq: Once | INTRAMUSCULAR | Status: DC | PRN

## 2017-08-10 MED ORDER — HEPARIN BOLUS VIA INFUSION
2000.0000 [IU] | Freq: Once | INTRAVENOUS | Status: AC
  Administered 2017-08-10: 2000 [IU] via INTRAVENOUS
  Filled 2017-08-10: qty 2000

## 2017-08-10 MED ORDER — LIDOCAINE HCL (PF) 1 % IJ SOLN: 5.0000 mL | INTRAMUSCULAR | Status: DC | PRN

## 2017-08-10 MED ORDER — HEPARIN SODIUM (PORCINE) 1000 UNIT/ML DIALYSIS: 1000.0000 [IU] | INTRAMUSCULAR | Status: DC | PRN

## 2017-08-10 MED ORDER — LIDOCAINE-PRILOCAINE 2.5-2.5 % EX CREA: 1.0000 "application " | TOPICAL_CREAM | CUTANEOUS | Status: DC | PRN

## 2017-08-10 MED ORDER — OXYCODONE HCL 5 MG PO TABS
5.0000 mg | ORAL_TABLET | Freq: Four times a day (QID) | ORAL | Status: DC | PRN
  Administered 2017-08-10 – 2017-08-11 (×3): 5 mg via ORAL
  Filled 2017-08-10 (×3): qty 1

## 2017-08-10 MED ORDER — PENTAFLUOROPROP-TETRAFLUOROETH EX AERO: 1.0000 "application " | INHALATION_SPRAY | CUTANEOUS | Status: DC | PRN

## 2017-08-10 NOTE — Progress Notes (Signed)
[name]: [phone]  Patient name: Frank [name] Medical record number: [id number] Date of birth: [birth year] Age: 52 y.o. Gender: male  Primary Care Provider: Lolita Patella, MD Consultants: GI Code Status: Full code  Pt Overview and Major Events to Date:  1/18: Admitted with abdominal pain  Assessment and Plan: Frank [name] is a 52 y.o. male presenting with progressive epigastric pain, chest pain, N/V for the past few days. PMH is significant for ESRD, HTN, HFrEF, NICM,DVT/PE, anxiety and depression, chronic pain syndrome, anemia of chronic disease, GERD, chronic pancreatitis, HLD, OSA, marijuana abuse.  Abdominal pain: Likely due to acute on chronic pancreatitis. Lipase within normal limits but patient with chronic pancreatitis. CT abdomen with enlarged pancreatic pseudocyst from prior. There is also bowel air-fluid levels on abdominal series in ED concerning for ileus in the RLQ but not evidenced by CT. continues to complain epigastric pain radiating to his left chest. - Appreciate GI recs -Discussing about possible internal drain  - IR not interested but suggested getting a CT abdomen with contrast for better assessment of his pseudocyst.  - Vitals per floor routine - PO Protonix - K pad - Tramadol 42m q12h PRN - Phenergan PRN nausea  - Renal diet  Chest Pain/epigastric pain: Partly musculoskeletal. Chest pain is reproducible.  It could also be due to his enlarging pseudocyst.  EKG unchanged from prior. Troponin mildly elevated to 0.09 but stable.  He has ESRD.  Wells score 0 for PE. -Continue home meds (Coreg, Imdur) -Added atorvastatin  NICM/HFrEF: Last ECHO 10/2015 with EF 35-40%, diffuse hypokinesis, severe atrial enlargement. Does not appear fluid overloaded on exam. Dialyzed early this morning with 3L off. - Otherwise plan as noted above - continue home meds (see below)  [facility] (MWF): [name]  [facility]. Got HD 1/20 - nephro following  Mild Transaminitis: AST 86. History of chronic intermittent elevations. Denies history of alcohol use.  - Monitor for now  - if elevates, can consider further evaluation   HTN: Mildly elevated blood pressures.  Likely due to poor compliance with medication.  Supposedly on Coreg 254m Clonidine 0.2m22match (weekly), Cardura 4mg24mmdur 60mg37mhome. - continue home meds - IV hyralazine 5mg q67mprn for SBP >180, DBP >110  DVT/PE:Chronic. Multiple known VTE. INR was subtherapeutic likely due to poor compliance with his warfarin. - Hold warfarin for possible drain of his pseudocyst - Heparin per pharmacy - PT/INR  Anxiety and depression:Chronic.Stable. - Will monitor,holding home amitriptyline.  Anemia of chronic disease:Chronic. Hgb 10.6 (at b/l). No signs of active bleeding, denies blood in vomit. Platelets stable at 147. - Monitor CBC  GERD:Chronic. On Protonix 40mg B19m- PO Protonix 40mg da72m OSA:ChroEXB:MWUXLKGlly adherent to CPAP. - CPAP nightly  Marijuana abuse:Chronic. Denies recent use.  FEN/GI:  -Renal diet with fluid restriction.  Prophylaxis: On warfarin for history of DVT/PE  Disposition: Continue inpatient management.  Awaiting GI input.  Subjective:  No acute events overnight.  Continues to endorse pain especially after meal.  Pain is unchanged.  He had hemodialysis last night.   Objective: Temp:  [97.7 F (36.5 C)-98 F (36.7 C)] 97.9 F (36.6 C) (01/20 0119) Pulse Rate:  [71-87] 75 (01/20 0542) Resp:  [13-24] 14 (01/20 0542) BP: (124-170)/(70-114) 150/99 (01/20 0542) SpO2:  [91 %-99 %] 99 % (01/20 0542) Weight:  [172 lb 16 oz (78.5 kg)-181 lb 3.5 oz (82.2 kg)] 174  lb 9.7 oz (79.2 kg) (01/20 0542) Physical Exam: GEN: Lying in bed comfortably CVS: RRR, nl s1 & s2, no murmurs, no edema RESP: no IWOB, good air movement bilaterally, CTAB GI: BS present & normal, soft,  tenderness to palpation over epigastric areas.  Not distractible MSK: Tenderness to palpation over his chest bilaterally SKIN: no apparent skin lesion NEURO: alert and oiented appropriately, no gross deficits  Laboratory: Recent Labs  Lab 08/08/17 1720 08/09/17 0415  WBC 4.8 5.1  HGB 10.6* 10.5*  HCT 31.5* 31.9*  PLT 147* 140*   Recent Labs  Lab 08/08/17 1720 08/09/17 0415  NA 137 135  K 4.3 5.1  CL 93* 94*  CO2 27 23  BUN 39* 46*  CREATININE 7.53* 8.64*  CALCIUM 9.3 9.3  PROT 7.5 7.1  BILITOT 1.7* 1.9*  ALKPHOS 138* 117  ALT 60 58  AST 86* 75*  GLUCOSE 74 54*    Imaging/Diagnostic Tests: Ct Abdomen Pelvis Wo Contrast  Result Date: 08/08/2017 CLINICAL DATA:  52 year old male with history of chest pain and abdominal pain. EXAM: CT ABDOMEN AND PELVIS WITHOUT CONTRAST TECHNIQUE: Multidetector CT imaging of the abdomen and pelvis was performed following the standard protocol without IV contrast. COMPARISON:  CT the abdomen and pelvis 06/24/2017. FINDINGS: Lower chest: Cardiomegaly. Small amount of pericardial fluid and/or thickening, unlikely to be of any hemodynamic significance at this time. No pericardial calcifications. Hepatobiliary: No definite cystic or solid hepatic lesions are confidently identified on today's noncontrast CT examination. Unenhanced appearance of the gallbladder is unremarkable. Pancreas: No definite pancreatic mass noted on today's noncontrast CT examination. Increasing fluid adjacent to the body and tail of the pancreas extending into the gastrosplenic ligament, currently measuring 8.0 x 10.7 x 7.6 cm (axial image 31 of series 3 and coronal image 74 of series 6), potentially an enlarging pancreatic pseudocyst. Spleen: Unremarkable. Adrenals/Urinary Tract: Severe atrophy of both kidneys. Low-attenuation lesions in the left kidney, incompletely characterized on today's noncontrast CT examination, largest of which measures up to 3.1 cm in diameter, likely to  represent cysts. No hydroureteronephrosis. Urinary bladder is normal in appearance. Bilateral adrenal glands are normal in appearance. Stomach/Bowel: Normal appearance of the stomach, with exception of some adjacent fluid in the gastrosplenic ligament (discussed above). No pathologic dilatation of small bowel or colon. The appendix is not confidently identified and may be surgically absent. Regardless, there are no inflammatory changes noted adjacent to the cecum to suggest the presence of an acute appendicitis at this time. Vascular/Lymphatic: Aortic atherosclerosis without evidence of aneurysm in the abdominal or pelvic vasculature. No lymphadenopathy noted in the abdomen or pelvis. Reproductive: Prostate gland and seminal vesicles are unremarkable in appearance. Other: Soft tissue mass in the left iliac fossa is presumably an old renal transplant, with extensive calcifications in the vascular pedicle, similar to prior studies. Trace volume of ascites. No pneumoperitoneum. Musculoskeletal: There are no aggressive appearing lytic or blastic lesions noted in the visualized portions of the skeleton. IMPRESSION: 1. Increasing fluid in the gastrosplenic ligament, suspicious for an enlarging pancreatic pseudocyst. Clinical correlation for signs and symptoms of pancreatitis is recommended. 2. Trace volume of ascites. 3. Cardiomegaly. 4. Aortic atherosclerosis. 5. Additional incidental findings, as above. Aortic Atherosclerosis (ICD10-I70.0). Electronically Signed   By: Vinnie Langton M.D.   On: 08/08/2017 20:56   Dg Abdomen Acute W/chest  Result Date: 08/08/2017 CLINICAL DATA:  Chest pain and abdominal pain. EXAM: DG ABDOMEN ACUTE W/ 1V CHEST COMPARISON:  Chest radiograph July 03, 2017. FINDINGS: The  cardiac silhouette is moderately enlarged and unchanged. Calcified aortic knob. Pulmonary vascular congestion and diffuse interstitial prominence without pleural effusion or focal consolidation. No pneumothorax.  Soft tissue planes and included osseous structures are non suspicious. Small bowel air-fluid levels RIGHT lower quadrant. No small and large bowel distension. Severe aortoiliac vascular calcifications. No intra-abdominal mass effect. Soft tissue planes and included osseous structures are non suspicious. IMPRESSION: Stable cardiomegaly and interstitial edema. Focal ileus RIGHT abdomen, less likely early versus nonacute bowel obstruction. Aortic Atherosclerosis (ICD10-I70.0). Electronically Signed   By: Elon Alas M.D.   On: 08/08/2017 18:38    Mercy Riding, MD 08/10/2017, 6:48 AM PGY-3, [place] Intern pager: [phone], text pages welcome

## 2017-08-10 NOTE — Progress Notes (Signed)
ANTICOAGULATION CONSULT NOTE - Follow Up Consult  Pharmacy Consult for heparin Indication: Hx DVT/PE  Allergies  Allergen Reactions  . Butalbital-Apap-Caffeine Shortness Of Breath, Swelling and Other (See Comments)    Swelling in throat  . Ferrlecit [Na Ferric Gluc Cplx In Sucrose] Shortness Of Breath, Swelling and Other (See Comments)    Swelling in throat, tolerates Venofor  . Minoxidil Shortness Of Breath  . Tylenol [Acetaminophen] Anaphylaxis and Swelling  . Darvocet [Propoxyphene N-Acetaminophen] Hives    Patient Measurements: Height: _0  (188 cm) Weight: 174 lb 9.7 oz (79.2 kg) IBW/kg (Calculated) : 82.2 Heparin Dosing Weight: 78.5 kg  Vital Signs: BP: 156/100 (01/20 0651) Pulse Rate: 79 (01/20 0651)  Labs: Recent Labs    08/08/17 1720 08/08/17 2155 08/09/17 0332 08/09/17 0415 08/10/17 1430  HGB 10.6*  --   --  10.5*  --   HCT 31.5*  --   --  31.9*  --   PLT 147*  --   --  140*  --   LABPROT  --   --  18.8*  --  16.8*  INR  --   --  1.59  --  1.37  HEPARINUNFRC  --   --   --   --  <0.10*  CREATININE 7.53*  --   --  8.64*  --   TROPONINI 0.09* 0.09* 0.09*  --   --     Estimated Creatinine Clearance: 11.1 mL/min (A) (by C-G formula based on SCr of 8.64 mg/dL (H)).   Medications:  Scheduled:  . amLODipine  10 mg Oral QHS  . atorvastatin  10 mg Oral q1800  . carvedilol  25 mg Oral BID WC  . [START ON 08/13/2017] cloNIDine  0.3 mg Transdermal Q Wed  . dicyclomine  20 mg Oral Q6H  . doxazosin  4 mg Oral QHS  . feeding supplement (NEPRO CARB STEADY)  237 mL Oral TID BM  . isosorbide mononitrate  60 mg Oral Daily  . pantoprazole  40 mg Oral Daily  . sevelamer carbonate  2,400 mg Oral TID WC   Infusions:  . sodium chloride    . sodium chloride    . heparin 1,350 Units/hr (08/09/17 2252)    Assessment: 52 y/o male on warfarin PTA for hx of recurrent DVT/PE, noncompliance, admitted for abdominal pain and possible pseudocyst drainage. Pharmacy consulted  to hold warfarin and dose heparin while awaiting possible procedure.   Heparin started last night given subtherapeutic INR on admission. Patient went to dialysis overnight, so first heparin level was pushed out to the afternoon to ensure accuracy. Per RN patient returned from dialysis ~0630, and heparin was not turned back on until ~0900. Heparin level was drawn ~1430, less than 6 hours later. Patient was never given heparin bolus due to recent warfarin. However, now with undetectable heparin level, will need to bolus and increase rate to get patient to goal.   Goal of Therapy:  Heparin level 0.3-0.7 units/ml Monitor platelets by anticoagulation protocol: Yes   Plan:  Heparin 2000 unit bolus  Increase heparin to 1450 units/hr 8-hour heparin level Daily heparin level and CBC while on heparin F/u drainage procedure plans - GI to see in AM   Charlene Brooke, PharmD PGY1 Pharmacy Resident Phone: [phone] After 3:30PM please call Main Pharmacy [phone] 08/10/2017,3:24 PM

## 2017-08-10 NOTE — Progress Notes (Signed)
Patient ordered food from outside the facility. I educated the patient on his renal diet and fluid restrictions. He said he understands, but he is going to order food anyway.

## 2017-08-10 NOTE — Progress Notes (Signed)
HD tx initiated via 15Gx2 w/o problem, pull/push/flush equally w/o problem, VSS, will cont to monitor while on HD tx

## 2017-08-10 NOTE — Progress Notes (Signed)
UNASSIGNED PATIENT Subjective: Since I last evaluated the patient, there has not been much changes or in his overall condition. He continues to complain of abdominal pain but seems quite comfortable sitting on the edge of his bed consuming a normal meal. He has not had a BM in a few days  Objective: Vital signs in last 24 hours: Temp:  [97.7 F (36.5 C)-97.9 F (36.6 C)] 97.9 F (36.6 C) (01/20 0119) Pulse Rate:  [71-84] 79 (01/20 0651) Resp:  [13-24] 16 (01/20 0651) BP: (124-160)/(70-109) 156/100 (01/20 0651) SpO2:  [91 %-99 %] 96 % (01/20 0651) Weight:  [78.5 kg (172 lb 16 oz)-82.2 kg (181 lb 3.5 oz)] 79.2 kg (174 lb 9.7 oz) (01/20 0542) Last BM Date: 08/09/17  Intake/Output from previous day: 01/19 0701 - 01/20 0700 In: 642.3 [P.O.:600; I.V.:42.3] Out: 3001 [Stool:1] Intake/Output this shift: Total I/O In: 480 [P.O.:480] Out: 0   General appearance: alert, cooperative, no distress and moderately obese Resp: clear to auscultation bilaterally Cardio: regular rate and rhythm, S1, S2 normal, no murmur, click, rub or gallop GI: soft, tender in the periumbilical region with gaurding; bowel sounds are hypoactive; no masses,  no organomegaly Extremities: extremities normal, atraumatic, no cyanosis or edema  Lab Results: Recent Labs    08/08/17 1720 08/09/17 0415  WBC 4.8 5.1  HGB 10.6* 10.5*  HCT 31.5* 31.9*  PLT 147* 140*   BMET Recent Labs    08/08/17 1720 08/09/17 0415  NA 137 135  K 4.3 5.1  CL 93* 94*  CO2 27 23  GLUCOSE 74 54*  BUN 39* 46*  CREATININE 7.53* 8.64*  CALCIUM 9.3 9.3   LFT Recent Labs    08/09/17 0415  PROT 7.1  ALBUMIN 2.9*  AST 75*  ALT 58  ALKPHOS 117  BILITOT 1.9*  BILIDIR 0.6*  IBILI 1.3*   PT/INR Recent Labs    08/09/17 0332  LABPROT 18.8*  INR 1.59   Studies/Results: Ct Abdomen Pelvis Wo Contrast  Result Date: 08/08/2017 CLINICAL DATA:  52 year old male with history of chest pain and abdominal pain. EXAM: CT ABDOMEN  AND PELVIS WITHOUT CONTRAST TECHNIQUE: Multidetector CT imaging of the abdomen and pelvis was performed following the standard protocol without IV contrast. COMPARISON:  CT the abdomen and pelvis 06/24/2017. FINDINGS: Lower chest: Cardiomegaly. Small amount of pericardial fluid and/or thickening, unlikely to be of any hemodynamic significance at this time. No pericardial calcifications. Hepatobiliary: No definite cystic or solid hepatic lesions are confidently identified on today's noncontrast CT examination. Unenhanced appearance of the gallbladder is unremarkable. Pancreas: No definite pancreatic mass noted on today's noncontrast CT examination. Increasing fluid adjacent to the body and tail of the pancreas extending into the gastrosplenic ligament, currently measuring 8.0 x 10.7 x 7.6 cm (axial image 31 of series 3 and coronal image 74 of series 6), potentially an enlarging pancreatic pseudocyst. Spleen: Unremarkable. Adrenals/Urinary Tract: Severe atrophy of both kidneys. Low-attenuation lesions in the left kidney, incompletely characterized on today's noncontrast CT examination, largest of which measures up to 3.1 cm in diameter, likely to represent cysts. No hydroureteronephrosis. Urinary bladder is normal in appearance. Bilateral adrenal glands are normal in appearance. Stomach/Bowel: Normal appearance of the stomach, with exception of some adjacent fluid in the gastrosplenic ligament (discussed above). No pathologic dilatation of small bowel or colon. The appendix is not confidently identified and may be surgically absent. Regardless, there are no inflammatory changes noted adjacent to the cecum to suggest the presence of an acute appendicitis at this  time. Vascular/Lymphatic: Aortic atherosclerosis without evidence of aneurysm in the abdominal or pelvic vasculature. No lymphadenopathy noted in the abdomen or pelvis. Reproductive: Prostate gland and seminal vesicles are unremarkable in appearance. Other:  Soft tissue mass in the left iliac fossa is presumably an old renal transplant, with extensive calcifications in the vascular pedicle, similar to prior studies. Trace volume of ascites. No pneumoperitoneum. Musculoskeletal: There are no aggressive appearing lytic or blastic lesions noted in the visualized portions of the skeleton. IMPRESSION: 1. Increasing fluid in the gastrosplenic ligament, suspicious for an enlarging pancreatic pseudocyst. Clinical correlation for signs and symptoms of pancreatitis is recommended. 2. Trace volume of ascites. 3. Cardiomegaly. 4. Aortic atherosclerosis. 5. Additional incidental findings, as above. Aortic Atherosclerosis (ICD10-I70.0). Electronically Signed   By: Vinnie Langton M.D.   On: 08/08/2017 20:56   Dg Abdomen Acute W/chest  Result Date: 08/08/2017 CLINICAL DATA:  Chest pain and abdominal pain. EXAM: DG ABDOMEN ACUTE W/ 1V CHEST COMPARISON:  Chest radiograph July 03, 2017. FINDINGS: The cardiac silhouette is moderately enlarged and unchanged. Calcified aortic knob. Pulmonary vascular congestion and diffuse interstitial prominence without pleural effusion or focal consolidation. No pneumothorax. Soft tissue planes and included osseous structures are non suspicious. Small bowel air-fluid levels RIGHT lower quadrant. No small and large bowel distension. Severe aortoiliac vascular calcifications. No intra-abdominal mass effect. Soft tissue planes and included osseous structures are non suspicious. IMPRESSION: Stable cardiomegaly and interstitial edema. Focal ileus RIGHT abdomen, less likely early versus nonacute bowel obstruction. Aortic Atherosclerosis (ICD10-I70.0). Electronically Signed   By: Elon Alas M.D.   On: 08/08/2017 18:38   Medications: I have reviewed the patient's current medications.  Assessment/Plan: 1) Enlarging pancreatic pseudocyst-I will have Dr. Benson Norway see the patient tomorrow morning and decided to begin to drain the cyst if not further  referral will need to be made to today tertiary care center.  2) ERSD on HD. 3) Metabolic bone disease.  4) History of DVT on warfarin subtherapeutic INR. 5)  NICM.  6) Long-standing history of noncompliance.  LOS: 1 day   Bena Kobel 08/10/2017, 11:12 AM

## 2017-08-10 NOTE — Progress Notes (Signed)
HD tx completed @ 0515 w/o problem, UF goal met, blood rinsed back, VSS, report called to Fredrich Romans, RN

## 2017-08-10 NOTE — Progress Notes (Signed)
  [facility] Progress Note   Assessment/ Plan:   Dialysis Orders:  MWF at [facility] 4hr, 400/800, EDW 79kg, 2K/2.25Ca, AVF, heparin 2000 bolus - Venofer 60mg IV weekly - Mircera 159m IV q 2 weeks (last 1/11) - Hectoral 20m71mIV q HD  Assessment/Plan: 1.  Abdominal pain/?pancreatic pseudocyst: Likely will need internal drainage per GI note.  Further eval pending on Monday 2.  ESRD: Usually MWF schedule. Despite 3 HD this week, appears to have extra volume.  S/p short HD 1/19 with 3L UF, then back to MWF schedule. 3.  Hypertension/volume: BP ok, + facial edema, interstitial edema on CXR. 3L UF tonight. Will need EDW lowered. 4.  Anemia: Hgb 10.5. No ESA due for now. 5.  Metabolic bone disease: Ca ok. Follow labs, resume VDRA next week. 6.  Type 2 DM: Per primary. 7.  NICM (EF 35-40%): per primary. 8. Hx  DVT (on warfarin): INR low. Per primary.    Subjective:    Tolerated HD last night.  This AM seen walking in halls   Objective:   BP (!) 156/100 (BP Location: Right Arm)   Pulse 79   Temp 97.9 F (36.6 C) (Oral)   Resp 16   Ht _0  (1.88 m)   Wt 79.2 kg (174 lb 9.7 oz)   SpO2 96%   BMI 22.42 kg/m   Physical Exam: GenYJE:HUDJSHF halls, well- appearing CVSWYO:VZCHYgular Resp: normal WOB Abd: mildly distended but able to walk without pain Ext: no LE edema ACCESS: L forearm AVF + T/B SKIN: diffuse excoriations  Labs: BMET Recent Labs  Lab 08/08/17 1720 08/09/17 0415  NA 137 135  K 4.3 5.1  CL 93* 94*  CO2 27 23  GLUCOSE 74 54*  BUN 39* 46*  CREATININE 7.53* 8.64*  CALCIUM 9.3 9.3   CBC Recent Labs  Lab 08/08/17 1720 08/09/17 0415  WBC 4.8 5.1  NEUTROABS 3.5  --   HGB 10.6* 10.5*  HCT 31.5* 31.9*  MCV 86.5 86.9  PLT 147* 140*    _1 @ Medications:    . amLODipine  10 mg Oral QHS  . atorvastatin  10 mg Oral q1800  . carvedilol  25 mg Oral BID WC  . [START ON 08/13/2017] cloNIDine  0.3 mg Transdermal Q Wed  . dicyclomine   20 mg Oral Q6H  . doxazosin  4 mg Oral QHS  . feeding supplement (NEPRO CARB STEADY)  237 mL Oral TID BM  . isosorbide mononitrate  60 mg Oral Daily  . pantoprazole  40 mg Oral Daily  . sevelamer carbonate  2,400 mg Oral TID WC     EliMadelon Lips CarHighsmith-Rainey Memorial Hospitalr [phone]/2019, 11:00 AM

## 2017-08-11 LAB — BASIC METABOLIC PANEL
Anion gap: 14 (ref 5–15)
BUN: 40 mg/dL — ABNORMAL HIGH (ref 6–20)
CALCIUM: 9 mg/dL (ref 8.9–10.3)
CO2: 24 mmol/L (ref 22–32)
CREATININE: 8.02 mg/dL — AB (ref 0.61–1.24)
Chloride: 101 mmol/L (ref 101–111)
GFR calc non Af Amer: 7 mL/min — ABNORMAL LOW (ref >60)
GFR, EST AFRICAN AMERICAN: 8 mL/min — AB (ref >60)
Glucose, Bld: 121 mg/dL — ABNORMAL HIGH (ref 65–99)
Potassium: 4.4 mmol/L (ref 3.5–5.1)
SODIUM: 139 mmol/L (ref 135–145)

## 2017-08-11 LAB — CBC
HCT: 29.3 % — ABNORMAL LOW (ref 39.0–52.0)
HEMOGLOBIN: 9.7 g/dL — AB (ref 13.0–17.0)
MCH: 29 pg (ref 26.0–34.0)
MCHC: 33.1 g/dL (ref 30.0–36.0)
MCV: 87.7 fL (ref 78.0–100.0)
Platelets: 95 10*3/uL — ABNORMAL LOW (ref 150–400)
RBC: 3.34 MIL/uL — ABNORMAL LOW (ref 4.22–5.81)
RDW: 19.5 % — AB (ref 11.5–15.5)
WBC: 4.2 10*3/uL (ref 4.0–10.5)

## 2017-08-11 LAB — HEPARIN LEVEL (UNFRACTIONATED)
HEPARIN UNFRACTIONATED: 0.22 [IU]/mL — AB (ref 0.30–0.70)
Heparin Unfractionated: 0.1 IU/mL — ABNORMAL LOW (ref 0.30–0.70)

## 2017-08-11 MED ORDER — DARBEPOETIN ALFA 100 MCG/0.5ML IJ SOSY
100.0000 ug | PREFILLED_SYRINGE | INTRAMUSCULAR | Status: DC
  Administered 2017-08-12: 100 ug via INTRAVENOUS

## 2017-08-11 MED ORDER — OXYCODONE HCL 5 MG PO TABS
5.0000 mg | ORAL_TABLET | ORAL | Status: DC | PRN
  Administered 2017-08-11: 5 mg via ORAL
  Filled 2017-08-11 (×2): qty 1

## 2017-08-11 NOTE — Progress Notes (Signed)
Patient didn't want to go to HD at this time because according to patient,MD told him yesterday that they are looking for MD to place his drain today and if unable he might be transferred to [facility]. This RN informed patient that he doesn't have any procedure scheduled for today except for hemodialysis. Patient insists that he will have procedure done today and refused to go to HD, Hemodialysis RN made aware. Tanaya Dunigan, [name], Therapist, sports

## 2017-08-11 NOTE — Progress Notes (Signed)
ANTICOAGULATION CONSULT NOTE - Follow Up Consult  Pharmacy Consult for Heparin (warfarin on hold) Indication: Hx DVT/PE  Patient Measurements: Height: _0  (188 cm) Weight: 173 lb (78.5 kg) IBW/kg (Calculated) : 82.2 Heparin Dosing Weight: 78.5 kg  Vital Signs: Temp: 98.1 F (36.7 C) (01/21 0544) Temp Source: Oral (01/21 0544) BP: 115/85 (01/21 0544) Pulse Rate: 67 (01/21 0544)  Labs: Recent Labs    08/08/17 1720 08/08/17 2155 08/09/17 0332 08/09/17 0415 08/10/17 1430 08/11/17 0046 08/11/17 0540  HGB 10.6*  --   --  10.5* 9.7* 9.7*  --   HCT 31.5*  --   --  31.9* 30.0* 29.3*  --   PLT 147*  --   --  140* 116* 95*  --   LABPROT  --   --  18.8*  --  16.8*  --   --   INR  --   --  1.59  --  1.37  --   --   HEPARINUNFRC  --   --   --   --  <0.10*  --  0.22*  CREATININE 7.53*  --   --  8.64* 6.96* 8.02*  --   TROPONINI 0.09* 0.09* 0.09*  --   --   --   --     Estimated Creatinine Clearance: 11.8 mL/min (A) (by C-G formula based on SCr of 8.02 mg/dL (H)).   Medications:  Scheduled:  . amLODipine  10 mg Oral QHS  . atorvastatin  10 mg Oral q1800  . carvedilol  25 mg Oral BID WC  . [START ON 08/13/2017] cloNIDine  0.3 mg Transdermal Q Wed  . dicyclomine  20 mg Oral Q6H  . doxazosin  4 mg Oral QHS  . feeding supplement (NEPRO CARB STEADY)  237 mL Oral TID BM  . isosorbide mononitrate  60 mg Oral Daily  . pantoprazole  40 mg Oral Daily  . sevelamer carbonate  2,400 mg Oral TID WC   Infusions:  . sodium chloride    . sodium chloride    . heparin 1,600 Units/hr (08/11/17 0703)    Assessment: 52 y/o male on warfarin PTA for hx of recurrent DVT/PE, noncompliance, admitted for abdominal pain and possible pseudocyst drainage. Pharmacy consulted to hold warfarin and dose heparin while awaiting possible procedure.   Heparin level this afternoon is SUBtherapeutic despite a rate increase earlier today (HL 0.1 << 0.22, goal of 0.3-0.7). It is noted that a new heparin bag was  hung ~1 hour prior to the level being drawn. Awaiting decision on possible procedure today. Hgb/Hct low but stable, plts with slow trend down - will watch.  The night shift RN reported that the patient was stopping the drip however the dayshift RN reports no issues with it today. Will increase and recheck another level this evening.   Goal of Therapy:  Heparin level 0.3-0.7 units/ml Monitor platelets by anticoagulation protocol: Yes   Plan:  1. Increase Heparin to 1800 units/hr (18 ml/hr) 2. Will continue to monitor for any signs/symptoms of bleeding and will follow up with heparin level in 8 hours   Thank you for allowing pharmacy to be a part of this patient's care.  Alycia Rossetti, PharmD, BCPS Clinical Pharmacist Pager: [phone] Clinical phone for 08/11/2017 from 7a-3:30p: [phone] If after 3:30p, please call main pharmacy at: [phone] 08/11/2017 2:37 PM

## 2017-08-11 NOTE — Progress Notes (Signed)
Per lab tech patient released tourniquet before she can get enough blood for heparin level then refused to be stuck again. Nixxon Faria, [name], Therapist, sports

## 2017-08-11 NOTE — Progress Notes (Signed)
[facility] Progress Note   Subjective:   No new complaints. Refused dialysis this morning because he is anticipating having a drain placed today.   Objective Vitals:   08/10/17 0651 08/10/17 2116 08/10/17 2121 08/11/17 0544  BP: (!) 156/100  (!) 140/98 115/85  Pulse: 79 80 73 67  Resp: _0 Temp:   98.2 F (36.8 C) 98.1 F (36.7 C)  TempSrc:   Oral Oral  SpO2: 96% 98% 96% 94%  Weight:   78.5 kg (173 lb)   Height:       Physical Exam General:NAD, WNWD Heart:RRR Lungs:CTAB, nml WOB Abdomen: mildly distended, +diffuse tenderness Extremities:no LE edema Dialysis Access: LU AVF +t/b   Filed Weights   08/10/17 0119 08/10/17 0542 08/10/17 2121  Weight: 82.2 kg (181 lb 3.5 oz) 79.2 kg (174 lb 9.7 oz) 78.5 kg (173 lb)    Intake/Output Summary (Last 24 hours) at 08/11/2017 1102 Last data filed at 08/11/2017 0545 Gross per 24 hour  Intake 991.18 ml  Output 0 ml  Net 991.18 ml    Additional Objective Labs: Basic Metabolic Panel: Recent Labs  Lab 08/09/17 0415 08/10/17 1430 08/11/17 0046  NA 135 135 139  K 5.1 4.6 4.4  CL 94* 98* 101  CO2 _1 GLUCOSE 54* 135* 121*  BUN 46* 31* 40*  CREATININE 8.64* 6.96* 8.02*  CALCIUM 9.3 8.9 9.0  PHOS  --  5.4*  --    Liver Function Tests: Recent Labs  Lab 08/08/17 1720 08/09/17 0415 08/10/17 1430  AST 86* 75*  --   ALT 60 58  --   ALKPHOS 138* 117  --   BILITOT 1.7* 1.9*  --   PROT 7.5 7.1  --   ALBUMIN 3.1* 2.9* 2.7*   Recent Labs  Lab 08/08/17 1720  LIPASE 32   CBC: Recent Labs  Lab 08/08/17 1720 08/09/17 0415 08/10/17 1430 08/11/17 0046  WBC 4.8 5.1 4.5 4.2  NEUTROABS 3.5  --   --   --   HGB 10.6* 10.5* 9.7* 9.7*  HCT 31.5* 31.9* 30.0* 29.3*  MCV 86.5 86.9 87.7 87.7  PLT 147* 140* 116* 95*    Cardiac Enzymes: Recent Labs  Lab 08/08/17 1720 08/08/17 2155 08/09/17 0332  TROPONINI 0.09* 0.09* 0.09*    Lab Results  Component Value Date   INR 1.37 08/10/2017   INR  1.59 08/09/2017   INR 1.22 07/27/2017   Studies/Results: No results found.  Medications: . sodium chloride    . sodium chloride    . heparin 1,600 Units/hr (08/11/17 0703)   . amLODipine  10 mg Oral QHS  . atorvastatin  10 mg Oral q1800  . carvedilol  25 mg Oral BID WC  . [START ON 08/13/2017] cloNIDine  0.3 mg Transdermal Q Wed  . dicyclomine  20 mg Oral Q6H  . doxazosin  4 mg Oral QHS  . feeding supplement (NEPRO CARB STEADY)  237 mL Oral TID BM  . isosorbide mononitrate  60 mg Oral Daily  . pantoprazole  40 mg Oral Daily  . sevelamer carbonate  2,400 mg Oral TID WC    Dialysis Orders: MWF at [facility] 4hr, 400/800, EDW 79kg, 2K/2.25Ca, AVF, heparin 2000 bolus - Venofer 61mg IV weekly - Mircera 1513m IV q 2 weeks (last 1/11) - Hectoral 78m56mIV q HD  Assessment/Plan: 1. Abdominal pain/pancreatic pseudocyst - To be seen by Dr. HunBenson Norwayday for eval, possible drain placement, may have to  be transferred to tertiary center. Per GI 2. ESRD - Urgent treatment yesterday, net UF 3L, orders written for today.  K No urgent indication for HD today.  Will resume regular schedule this week. 3. HTN/volume - BP controlled. Under EDW. Volume status improved. Continue to titrate down volume as tolerated and lower EDW at d/c.  4. Anemia of CKD- Hgb 9.7, add 134mg Aranesp qwk to start Wed.  5. Secondary hyperparathyroidism - Ca 8.9, P 5.4. Continue VDRA. 6. Nutrition - Renal diet, Nepro, Renavite 7. Type 2 DM - per primary 8. NICM (EF 35-40%) - per primary 9. Hx DVT (on warfarin) - Holding warfarin due to possible procedure. per primary/pharm  LJen Mow PA-C [facility] Pager: [phone]/21/2019,11:02 AM  LOS: 2 days   Pt seen, examined and agree w A/P as above.  RKelly SplinterMD CNewell Rubbermaidpager [phone]  08/11/2017, 2:17 PM

## 2017-08-11 NOTE — Progress Notes (Signed)
PT Cancellation Note  Patient Details Name: Frank [name] MRN: [id number] DOB: 04/22/1966   Cancelled Treatment:    Reason Eval/Treat Not Completed: Patient declined, no reason specified.  Pt refuses PT evaluation stating "I am going to surgery later and I am trying to sleep".  PT attempted to educate pt on reason for PT eval, pt continues to refuse at this time   [facility] 08/11/2017, 10:15 AM

## 2017-08-11 NOTE — Progress Notes (Signed)
Patient disconnected himself off of his heparin IV x2. States "that heparin makes me sluggish". Informed patient he cannot just disconnect his own IV. States "I'll connect it in a few minutes". Patient made aware on why he is on heparin and that he cannot just connect an exposed IV tubing back to his IV site,as it will pose as source of infection . Both times,IV tubing were changed. Dreydon Cardenas, [name], Therapist, sports

## 2017-08-11 NOTE — Progress Notes (Signed)
ANTICOAGULATION CONSULT NOTE - Follow Up Consult  Pharmacy Consult for Heparin (warfarin on hold) Indication: Hx DVT/PE  Allergies  Allergen Reactions  . Butalbital-Apap-Caffeine Shortness Of Breath, Swelling and Other (See Comments)    Swelling in throat  . Ferrlecit [Na Ferric Gluc Cplx In Sucrose] Shortness Of Breath, Swelling and Other (See Comments)    Swelling in throat, tolerates Venofor  . Minoxidil Shortness Of Breath  . Tylenol [Acetaminophen] Anaphylaxis and Swelling  . Darvocet [Propoxyphene N-Acetaminophen] Hives    Patient Measurements: Height: _0  (188 cm) Weight: 173 lb (78.5 kg) IBW/kg (Calculated) : 82.2 Heparin Dosing Weight: 78.5 kg  Vital Signs: Temp: 98.1 F (36.7 C) (01/21 0544) Temp Source: Oral (01/21 0544) BP: 115/85 (01/21 0544) Pulse Rate: 67 (01/21 0544)  Labs: Recent Labs    08/08/17 1720 08/08/17 2155 08/09/17 0332 08/09/17 0415 08/10/17 1430 08/11/17 0046 08/11/17 0540  HGB 10.6*  --   --  10.5* 9.7* 9.7*  --   HCT 31.5*  --   --  31.9* 30.0* 29.3*  --   PLT 147*  --   --  140* 116* 95*  --   LABPROT  --   --  18.8*  --  16.8*  --   --   INR  --   --  1.59  --  1.37  --   --   HEPARINUNFRC  --   --   --   --  <0.10*  --  0.22*  CREATININE 7.53*  --   --  8.64* 6.96* 8.02*  --   TROPONINI 0.09* 0.09* 0.09*  --   --   --   --     Estimated Creatinine Clearance: 11.8 mL/min (A) (by C-G formula based on SCr of 8.02 mg/dL (H)).   Medications:  Scheduled:  . amLODipine  10 mg Oral QHS  . atorvastatin  10 mg Oral q1800  . carvedilol  25 mg Oral BID WC  . [START ON 08/13/2017] cloNIDine  0.3 mg Transdermal Q Wed  . dicyclomine  20 mg Oral Q6H  . doxazosin  4 mg Oral QHS  . feeding supplement (NEPRO CARB STEADY)  237 mL Oral TID BM  . isosorbide mononitrate  60 mg Oral Daily  . pantoprazole  40 mg Oral Daily  . sevelamer carbonate  2,400 mg Oral TID WC   Infusions:  . sodium chloride    . sodium chloride    . heparin 1,450  Units/hr (08/10/17 1922)    Assessment: 52 y/o male on warfarin PTA for hx of recurrent DVT/PE, noncompliance, admitted for abdominal pain and possible pseudocyst drainage. Pharmacy consulted to hold warfarin and dose heparin while awaiting possible procedure.   1/21 AM: heparin level sub-therapeutic   Goal of Therapy:  Heparin level 0.3-0.7 units/ml Monitor platelets by anticoagulation protocol: Yes   Plan:  Inc heparin to 1600 units/hr 1400 HL  Narda Bonds, PharmD, BCPS Clinical Pharmacist Phone: [phone]

## 2017-08-11 NOTE — Progress Notes (Signed)
Initial Nutrition Assessment  DOCUMENTATION CODES:   Not applicable  INTERVENTION:   - Continue Nepro Shake po TID, each supplement provides 425 kcal and 19 grams protein - Encourage PO intake  NUTRITION DIAGNOSIS:   Inadequate oral intake related to poor appetite as evidenced by per patient/family report.  GOAL:   Patient will meet greater than or equal to 90% of their needs  MONITOR:   PO intake, Labs, Weight trends  REASON FOR ASSESSMENT:   Consult Assessment of nutrition requirement/status  ASSESSMENT:   Pt admitted for acute on chronic pancreatitis with complaints of chest and abdominal pain. Pt with PMH of CHF, depression, ESRD on dialysis MWF, HTN, Type II DM, dyslipidemia, and GERD. Abdominal CT showed pancreatic pseudocyst, ascites, and pulmonary edema. Per GI note, pt will likely need internal drainage.  Pt's last HD was on 08/10/17.  Spoke with pt at bedside who reports "okay" appetite both PTA and during current admission. Pt reports having three meals per day at home but not eating much at each meal. Meal completion charted as 100% since admission. Pt stated he was able to eat "part of a bagel" for breakfast and was drinking a Nepro at time of visit. Pt would like to continue receiving Nepro TID and prefers the Mixed [employer] [facility] if available.  Pt states his weight is "up and down" and that he normally weighs around 165 lbs. Pt denies any recent weight loss. States that his current weight at 173 lbs is "too high" and that he needs to "get more fluid off."  Pt refused NFPE.  Medications reviewed and include: Lipitor, Protonix  Labs reviewed: glucose 121 (H), BUN 40 (H), creatinine 8.02 (H)  NUTRITION - FOCUSED PHYSICAL EXAM:    Most Recent Value  Orbital Region  Unable to assess  Upper Arm Region  Unable to assess  Thoracic and Lumbar Region  Unable to assess  Buccal Region  Unable to assess  [place] Region  Unable to assess  Clavicle Bone Region  Unable  to assess  Clavicle and Acromion Bone Region  Unable to assess  Scapular Bone Region  Unable to assess  Dorsal Hand  Unable to assess  Patellar Region  Unable to assess  Anterior Thigh Region  Unable to assess  Posterior Calf Region  Unable to assess  Edema (RD Assessment)  Unable to assess  Hair  Unable to assess  Eyes  Unable to assess  Mouth  Unable to assess  Skin  Unable to assess  Nails  Unable to assess       Diet Order:  Diet renal with fluid restriction Fluid restriction: Other (see comments); Room service appropriate? Yes; Fluid consistency: Thin  EDUCATION NEEDS:   No education needs have been identified at this time  Skin:  Skin Assessment: Reviewed RN Assessment  Last BM:  08/09/17  Height:   Ht Readings from Last 1 Encounters:  08/09/17 _0  (1.88 m)    Weight:   Wt Readings from Last 1 Encounters:  08/10/17 173 lb (78.5 kg)    Ideal Body Weight:  86.4 kg  BMI:  Body mass index is 22.21 kg/m.  Estimated Nutritional Needs:   Kcal:  2400-2600 kcal/day  Protein:  100-110 grams/day  Fluid:  per MD   Gaynell Face, MS, RD, LDN Pager: [phone] Weekend/After Hours: [phone]

## 2017-08-11 NOTE — Progress Notes (Signed)
[name]: [phone]  Patient name: Frank [name] Medical record number: [id number] Date of birth: 05/10/1966 Age: 52 y.o. Gender: male  Primary Care Provider: Lolita Patella, MD Consultants: GI Code Status: Full code  Pt Overview and Major Events to Date:  1/18: Admitted with abdominal pain  Assessment and Plan: Frank [name] is a 52 y.o. male presenting with progressive epigastric pain, chest pain, N/V for the past few days. PMH is significant for ESRD, HTN, HFrEF, NICM,DVT/PE, anxiety and depression, chronic pain syndrome, anemia of chronic disease, GERD, chronic pancreatitis, HLD, OSA, marijuana abuse.  Abdominal pain: Likely due to acute on chronic pancreatitis. Lipase within normal limits but patient with chronic pancreatitis. CT abdomen with enlarged pancreatic pseudocyst from prior. There is also bowel air-fluid levels on abdominal series in ED concerning for ileus in the RLQ but not evidenced by CT. Continues to complain of epigastric pain although able to tolerate regular diet. IR consulted who do not recommend external drain but suggested getting CT Abd with contrast for better assessment of pseudocyst. GI consulted who stated may be able to place internal drain but may would need transfer to tertiary center if not available to get here. - Appreciate GI recs - Dr. Benson Norway to see today. - Vitals per floor routine - PO Protonix - K pad - Tramadol 59m q12h PRN - Phenergan PRN nausea  - Renal diet  Chest Pain/epigastric pain: Partly musculoskeletal. Chest pain is reproducible on initial exam. Is not complaining of chest pain on exam this morning. It could also be due to his enlarging pseudocyst. EKG unchanged from prior. Troponin mildly elevated to 0.09 but stable.  He has ESRD.  Wells score 0 for PE. -Continue home meds (Coreg, Imdur) -Added atorvastatin  NICM/HFrEF: Last ECHO 10/2015 with EF 35-40%, diffuse hypokinesis, severe  atrial enlargement. Does not appear fluid overloaded on exam. Dialyzed 1/19 with 3L off. - Otherwise plan as noted above - continue home meds (see below)  ESRD (MWF): [facility]. Got HD 1/19. Scheduled to get HD today. - nephro following  Mild Transaminitis: AST 86. History of chronic intermittent elevations. Denies history of alcohol use.  - Monitor for now  - if elevates, can consider further evaluation   HTN: Mildly elevated blood pressures. Likely due to poor compliance with medication.  Supposedly on Coreg 220m Clonidine 0.83m30match (weekly), Cardura 4mg86mmdur 60mg72mhome. - continue home meds - IV hyralazine 5mg q39mprn for SBP >180, DBP >110  DVT/PE:Chronic. Multiple known VTE. INR was subtherapeutic likely due to poor compliance with his warfarin. - Hold warfarin for possible drain of his pseudocyst - Heparin per pharmacy - PT/INR  Anxiety and depression:Chronic.Stable. - Will monitor,holding home amitriptyline.  Anemia of chronic disease:Chronic. Hgb 10.6 (at b/l). No signs of active bleeding, denies blood in vomit. Platelets stable at 147. - Monitor CBC  GERD:Chronic. On Protonix 40mg B483m- PO Protonix 40mg da67m OSA:ChroVEH:MCNOBSJlly adherent to CPAP. - CPAP nightly  Marijuana abuse:Chronic. Denies recent use.  FEN/GI:  -Renal diet with fluid restriction.  Prophylaxis: On warfarin for history of DVT/PE  Disposition: Continue inpatient management.  Awaiting GI input.  Subjective:  Patient continues to endorse epigastric pain although able to tolerate regular diet. Patient wanting to know if drain will get placed and if he will be transferred.  Objective: Temp:  [98.1 F (36.7 C)-98.2 F (36.8 C)] 98.1 F (  36.7 C) (01/21 0544) Pulse Rate:  [67-80] 67 (01/21 0544) Resp:  [16-17] 16 (01/21 0544) BP: (115-140)/(85-98) 115/85 (01/21 0544) SpO2:  [94 %-98 %] 94 % (01/21 0544) Weight:  [173  lb (78.5 kg)] 173 lb (78.5 kg) (01/20 2121) Physical Exam: GEN: Lying in bed comfortably, in NAD. Eating breakfast CVS: RRR, no murmurs, no edema RESP: no IWOB, good air movement bilaterally, CTAB GI: BS present & normal, soft, tenderness to palpation diffusely with light palpation.  MSK: Denies tenderness to palpation over his chest bilaterally SKIN: no apparent skin lesion NEURO: alert and oriented appropriately, no gross deficits  Laboratory: Recent Labs  Lab 08/09/17 0415 08/10/17 1430 08/11/17 0046  WBC 5.1 4.5 4.2  HGB 10.5* 9.7* 9.7*  HCT 31.9* 30.0* 29.3*  PLT 140* 116* 95*   Recent Labs  Lab 08/08/17 1720 08/09/17 0415 08/10/17 1430 08/11/17 0046  NA 137 135 135 139  K 4.3 5.1 4.6 4.4  CL 93* 94* 98* 101  CO2 _0 BUN 39* 46* 31* 40*  CREATININE 7.53* 8.64* 6.96* 8.02*  CALCIUM 9.3 9.3 8.9 9.0  PROT 7.5 7.1  --   --   BILITOT 1.7* 1.9*  --   --   ALKPHOS 138* 117  --   --   ALT 60 58  --   --   AST 86* 75*  --   --   GLUCOSE 74 54* 135* 121*    Imaging/Diagnostic Tests: Ct Abdomen Pelvis Wo Contrast  Result Date: 08/08/2017 CLINICAL DATA:  52 year old male with history of chest pain and abdominal pain. EXAM: CT ABDOMEN AND PELVIS WITHOUT CONTRAST TECHNIQUE: Multidetector CT imaging of the abdomen and pelvis was performed following the standard protocol without IV contrast. COMPARISON:  CT the abdomen and pelvis 06/24/2017. FINDINGS: Lower chest: Cardiomegaly. Small amount of pericardial fluid and/or thickening, unlikely to be of any hemodynamic significance at this time. No pericardial calcifications. Hepatobiliary: No definite cystic or solid hepatic lesions are confidently identified on today's noncontrast CT examination. Unenhanced appearance of the gallbladder is unremarkable. Pancreas: No definite pancreatic mass noted on today's noncontrast CT examination. Increasing fluid adjacent to the body and tail of the pancreas extending into the  gastrosplenic ligament, currently measuring 8.0 x 10.7 x 7.6 cm (axial image 31 of series 3 and coronal image 74 of series 6), potentially an enlarging pancreatic pseudocyst. Spleen: Unremarkable. Adrenals/Urinary Tract: Severe atrophy of both kidneys. Low-attenuation lesions in the left kidney, incompletely characterized on today's noncontrast CT examination, largest of which measures up to 3.1 cm in diameter, likely to represent cysts. No hydroureteronephrosis. Urinary bladder is normal in appearance. Bilateral adrenal glands are normal in appearance. Stomach/Bowel: Normal appearance of the stomach, with exception of some adjacent fluid in the gastrosplenic ligament (discussed above). No pathologic dilatation of small bowel or colon. The appendix is not confidently identified and may be surgically absent. Regardless, there are no inflammatory changes noted adjacent to the cecum to suggest the presence of an acute appendicitis at this time. Vascular/Lymphatic: Aortic atherosclerosis without evidence of aneurysm in the abdominal or pelvic vasculature. No lymphadenopathy noted in the abdomen or pelvis. Reproductive: Prostate gland and seminal vesicles are unremarkable in appearance. Other: Soft tissue mass in the left iliac fossa is presumably an old renal transplant, with extensive calcifications in the vascular pedicle, similar to prior studies. Trace volume of ascites. No pneumoperitoneum. Musculoskeletal: There are no aggressive appearing lytic or blastic lesions noted in the visualized portions of the  skeleton. IMPRESSION: 1. Increasing fluid in the gastrosplenic ligament, suspicious for an enlarging pancreatic pseudocyst. Clinical correlation for signs and symptoms of pancreatitis is recommended. 2. Trace volume of ascites. 3. Cardiomegaly. 4. Aortic atherosclerosis. 5. Additional incidental findings, as above. Aortic Atherosclerosis (ICD10-I70.0). Electronically Signed   By: Vinnie Langton M.D.   On:  08/08/2017 20:56   Dg Abdomen Acute W/chest  Result Date: 08/08/2017 CLINICAL DATA:  Chest pain and abdominal pain. EXAM: DG ABDOMEN ACUTE W/ 1V CHEST COMPARISON:  Chest radiograph July 03, 2017. FINDINGS: The cardiac silhouette is moderately enlarged and unchanged. Calcified aortic knob. Pulmonary vascular congestion and diffuse interstitial prominence without pleural effusion or focal consolidation. No pneumothorax. Soft tissue planes and included osseous structures are non suspicious. Small bowel air-fluid levels RIGHT lower quadrant. No small and large bowel distension. Severe aortoiliac vascular calcifications. No intra-abdominal mass effect. Soft tissue planes and included osseous structures are non suspicious. IMPRESSION: Stable cardiomegaly and interstitial edema. Focal ileus RIGHT abdomen, less likely early versus nonacute bowel obstruction. Aortic Atherosclerosis (ICD10-I70.0). Electronically Signed   By: Elon Alas M.D.   On: 08/08/2017 18:38   Rory Percy, DO 08/11/2017, 7:14 AM PGY-1, [place] Intern pager: [phone], text pages welcome

## 2017-08-12 LAB — CBC
HEMATOCRIT: 31.7 % — AB (ref 39.0–52.0)
HEMOGLOBIN: 10.2 g/dL — AB (ref 13.0–17.0)
MCH: 28.7 pg (ref 26.0–34.0)
MCHC: 32.2 g/dL (ref 30.0–36.0)
MCV: 89.3 fL (ref 78.0–100.0)
Platelets: 109 10*3/uL — ABNORMAL LOW (ref 150–400)
RBC: 3.55 MIL/uL — AB (ref 4.22–5.81)
RDW: 19.4 % — ABNORMAL HIGH (ref 11.5–15.5)
WBC: 4.2 10*3/uL (ref 4.0–10.5)

## 2017-08-12 LAB — HEPARIN LEVEL (UNFRACTIONATED)
HEPARIN UNFRACTIONATED: 0.26 [IU]/mL — AB (ref 0.30–0.70)
Heparin Unfractionated: 0.19 IU/mL — ABNORMAL LOW (ref 0.30–0.70)

## 2017-08-12 LAB — BASIC METABOLIC PANEL
Anion gap: 15 (ref 5–15)
BUN: 56 mg/dL — ABNORMAL HIGH (ref 6–20)
CHLORIDE: 98 mmol/L — AB (ref 101–111)
CO2: 21 mmol/L — AB (ref 22–32)
CREATININE: 10.21 mg/dL — AB (ref 0.61–1.24)
Calcium: 9 mg/dL (ref 8.9–10.3)
GFR calc non Af Amer: 5 mL/min — ABNORMAL LOW (ref >60)
GFR, EST AFRICAN AMERICAN: 6 mL/min — AB (ref >60)
Glucose, Bld: 96 mg/dL (ref 65–99)
POTASSIUM: 5.4 mmol/L — AB (ref 3.5–5.1)
Sodium: 134 mmol/L — ABNORMAL LOW (ref 135–145)

## 2017-08-12 MED ORDER — POLYETHYLENE GLYCOL 3350 17 G PO PACK: 17.0000 g | PACK | Freq: Every day | ORAL | 0 refills | Status: DC | PRN

## 2017-08-12 MED ORDER — PENTAFLUOROPROP-TETRAFLUOROETH EX AERO
1.0000 | INHALATION_SPRAY | CUTANEOUS | Status: DC | PRN
Start: 2017-08-12 — End: 2017-08-12

## 2017-08-12 MED ORDER — DARBEPOETIN ALFA 100 MCG/0.5ML IJ SOSY
PREFILLED_SYRINGE | INTRAMUSCULAR | Status: AC
  Administered 2017-08-12: 100 ug via INTRAVENOUS
  Filled 2017-08-12: qty 0.5

## 2017-08-12 MED ORDER — DARBEPOETIN ALFA 100 MCG/0.5ML IJ SOSY: 100.0000 ug | PREFILLED_SYRINGE | INTRAMUSCULAR | Status: DC

## 2017-08-12 MED ORDER — HEPARIN SODIUM (PORCINE) 1000 UNIT/ML DIALYSIS: 1000.0000 [IU] | INTRAMUSCULAR | Status: DC | PRN

## 2017-08-12 MED ORDER — TRAMADOL HCL 50 MG PO TABS: 25.0000 mg | ORAL_TABLET | Freq: Two times a day (BID) | ORAL | 0 refills | Status: AC | PRN

## 2017-08-12 MED ORDER — LIDOCAINE-PRILOCAINE 2.5-2.5 % EX CREA: 1.0000 "application " | TOPICAL_CREAM | CUTANEOUS | Status: DC | PRN

## 2017-08-12 MED ORDER — SODIUM CHLORIDE 0.9 % IV SOLN: 100.0000 mL | INTRAVENOUS | Status: DC | PRN

## 2017-08-12 MED ORDER — HEPARIN SODIUM (PORCINE) 1000 UNIT/ML DIALYSIS: 40.0000 [IU]/kg | Freq: Once | INTRAMUSCULAR | Status: DC

## 2017-08-12 MED ORDER — ATORVASTATIN CALCIUM 10 MG PO TABS: 10.0000 mg | ORAL_TABLET | Freq: Every day | ORAL | 0 refills | Status: DC

## 2017-08-12 MED ORDER — ALTEPLASE 2 MG IJ SOLR: 2.0000 mg | Freq: Once | INTRAMUSCULAR | Status: DC | PRN

## 2017-08-12 MED ORDER — LIDOCAINE HCL (PF) 1 % IJ SOLN: 5.0000 mL | INTRAMUSCULAR | Status: DC | PRN

## 2017-08-12 NOTE — Discharge Instructions (Signed)
You were admitted for acute pancreatitis. You had a CT scan that showed enlarged pancreatic cyst from previous. GI was consulted who stated it would not be drained. Your pain was monitored and were able to tolerate a diet prior to discharge. Follow up with your primary doctor in one week. Follow up at [facility] on 1/24 for an INR check for coumadin dosing.     Acute Pancreatitis Acute pancreatitis is a condition in which the pancreas suddenly becomes irritated and swollen (has inflammation). The pancreas is a gland that is located behind the stomach. It produces enzymes that help to digest food. The pancreas also releases the hormones glucagon and insulin, which help to regulate blood sugar. Damage to the pancreas occurs when the digestive enzymes from the pancreas are activated before they are released into the intestine. Most acute attacks last a couple of days and can cause serious problems. Some people become dehydrated and develop low blood pressure. In severe cases, bleeding into the pancreas can lead to shock and can be life-threatening. The lungs, heart, and kidneys may fail. What are the causes? The most common causes of this condition are:  Alcohol abuse.  Gallstones.  Other causes include:  Certain medicines.  Exposure to certain chemicals.  Infection.  Damage caused by an accident (trauma).  Abdominal surgery.  In some cases, the cause may not be known. What are the signs or symptoms? Symptoms of this condition include:  Pain in the upper abdomen that may radiate to the back.  Tenderness and swelling of the abdomen.  Nausea and vomiting.  How is this diagnosed? This condition may be diagnosed based on:  A physical exam.  Blood tests.  Imaging tests, such as X-rays, CT scans, or an ultrasound of the abdomen.  How is this treated? Treatment for this condition usually requires a stay in the hospital. Treatment may include:  Pain  medicine.  Fluid replacement through an IV tube.  Placing a tube in the stomach to remove stomach contents and to control vomiting (NG tube, or nasogastric tube).  Not eating for 3-4 days. This gives the pancreas a rest, because enzymes are not being produced that can cause further damage.  Antibiotic medicines, if your condition is caused by an infection.  Surgery on the pancreas or gallbladder.  Follow these instructions at home: Eating and drinking  Follow instructions from your health care provider about diet. This may involve avoiding alcohol and decreasing the amount of fat in your diet.  Eat smaller, more frequent meals. This reduces the amount of digestive fluids that the pancreas produces.  Drink enough fluid to keep your urine clear or pale yellow.  Do not drink alcohol if it caused your condition. General instructions  Take over-the-counter and prescription medicines only as told by your health care provider.  Do not use any tobacco products, such as cigarettes, chewing tobacco, and e-cigarettes. If you need help quitting, ask your health care provider.  Get plenty of rest.  If directed, check your blood sugar at home as told by your health care provider.  Keep all follow-up visits as told by your health care provider. This is important. Contact a health care provider if:  You do not recover as quickly as expected.  You develop new or worsening symptoms.  You have persistent pain, weakness, or nausea.  You recover and then have another episode of pain.  You have a fever. Get help right away if:  You cannot eat or  keep fluids down.  Your pain becomes severe.  Your skin or the white part of your eyes turns yellow (jaundice).  You vomit.  You feel dizzy or you faint.  Your blood sugar is high (over 300 mg/dL). This information is not intended to replace advice given to you by your health care provider. Make sure you discuss any questions you have with  your health care provider. Document Released: 07/08/2005 Document Revised: 11/15/2015 Document Reviewed: 04/11/2015 Elsevier Interactive Patient Education  [employer].

## 2017-08-12 NOTE — Progress Notes (Signed)
ANTICOAGULATION CONSULT NOTE - Follow Up Consult  Pharmacy Consult for Heparin (warfarin on hold) Indication: Hx DVT/PE  Patient Measurements: Height: _0  (188 cm) Weight: 189 lb 13.1 oz (86.1 kg)(did not stand ) IBW/kg (Calculated) : 82.2 Heparin Dosing Weight: 78.5 kg  Vital Signs: Temp: 98 F (36.7 C) (01/22 0642) Temp Source: Oral (01/22 0642) BP: 161/99 (01/22 1030) Pulse Rate: 74 (01/22 1030)  Labs: Recent Labs    08/10/17 1430 08/11/17 0046  08/11/17 1317 08/12/17 0212 08/12/17 1000  HGB 9.7* 9.7*  --   --  10.2*  --   HCT 30.0* 29.3*  --   --  31.7*  --   PLT 116* 95*  --   --  109*  --   LABPROT 16.8*  --   --   --   --   --   INR 1.37  --   --   --   --   --   HEPARINUNFRC <0.10*  --    < > 0.10* 0.19* 0.26*  CREATININE 6.96* 8.02*  --   --  10.21*  --    < > = values in this interval not displayed.    Estimated Creatinine Clearance: 9.7 mL/min (A) (by C-G formula based on SCr of 10.21 mg/dL (H)).   Medications:  Scheduled:  . amLODipine  10 mg Oral QHS  . atorvastatin  10 mg Oral q1800  . carvedilol  25 mg Oral BID WC  . [START ON 08/13/2017] cloNIDine  0.3 mg Transdermal Q Wed  . darbepoetin (ARANESP) injection - DIALYSIS  100 mcg Intravenous Q Mon-HD  . dicyclomine  20 mg Oral Q6H  . doxazosin  4 mg Oral QHS  . feeding supplement (NEPRO CARB STEADY)  237 mL Oral TID BM  . heparin  40 Units/kg Dialysis Once in dialysis  . isosorbide mononitrate  60 mg Oral Daily  . pantoprazole  40 mg Oral Daily  . sevelamer carbonate  2,400 mg Oral TID WC   Infusions:  . sodium chloride    . sodium chloride    . sodium chloride    . sodium chloride    . heparin 1,800 Units/hr (08/11/17 2336)    Assessment: 52 y/o male on warfarin PTA for hx of recurrent DVT/PE, noncompliance, admitted for abdominal pain and possible pseudocyst drainage. Pharmacy consulted to hold warfarin and dose heparin while awaiting possible procedure.   Heparin level this morning  remains slightly SUBtherapeutic (HL 0.26, goal of 0.3-0.7). The patient has been noted to disconnect his heparin drip multiple times. It is unclear if he has received a full 8 hours on heparin for this level to be accurate.   Still awaiting GI decision on possible draining of pseudocyst. Hgb/Hct low but stable, plts up to 109 << 95  Goal of Therapy:  Heparin level 0.3-0.7 units/ml Monitor platelets by anticoagulation protocol: Yes   Plan:  1. Increase Heparin slightly to 1850 units/hr (18.5 ml/hr) 2. Will continue to monitor for any signs/symptoms of bleeding and will follow up with heparin level in 8 hours   Thank you for allowing pharmacy to be a part of this patient's care.  Alycia Rossetti, PharmD, BCPS Clinical Pharmacist Pager: [phone] Clinical phone for 08/12/2017 from 7a-3:30p: [phone] If after 3:30p, please call main pharmacy at: [phone] 08/12/2017 11:30 AM

## 2017-08-12 NOTE — Progress Notes (Signed)
[facility] Progress Note   Subjective:  On HD now off sched  Objective Vitals:   08/12/17 1000 08/12/17 1030 08/12/17 1056 08/12/17 1152  BP: (!) 161/94 (!) 161/99 134/85 (!) 150/91  Pulse: 80 74 75 84  Resp:   20 18  Temp:   (!) 97.5 F (36.4 C) 97.8 F (36.6 C)  TempSrc:   Oral Oral  SpO2:   99% 98%  Weight:   83.1 kg (183 lb 3.2 oz)   Height:       Physical Exam General:NAD, WNWD Heart:RRR Lungs:CTAB, nml WOB Abdomen: mildly distended, +diffuse tenderness Extremities:no LE edema Dialysis Access: LU AVF +t/b   Filed Weights   08/10/17 2121 08/12/17 0642 08/12/17 1056  Weight: 78.5 kg (173 lb) 86.1 kg (189 lb 13.1 oz) 83.1 kg (183 lb 3.2 oz)    Intake/Output Summary (Last 24 hours) at 08/12/2017 1331 Last data filed at 08/12/2017 1201 Gross per 24 hour  Intake 876.85 ml  Output 3089 ml  Net -2212.15 ml    Additional Objective Labs: Basic Metabolic Panel: Recent Labs  Lab 08/10/17 1430 08/11/17 0046 08/12/17 0212  NA 135 139 134*  K 4.6 4.4 5.4*  CL 98* 101 98*  CO2 23 24 21*  GLUCOSE 135* 121* 96  BUN 31* 40* 56*  CREATININE 6.96* 8.02* 10.21*  CALCIUM 8.9 9.0 9.0  PHOS 5.4*  --   --    Liver Function Tests: Recent Labs  Lab 08/08/17 1720 08/09/17 0415 08/10/17 1430  AST 86* 75*  --   ALT 60 58  --   ALKPHOS 138* 117  --   BILITOT 1.7* 1.9*  --   PROT 7.5 7.1  --   ALBUMIN 3.1* 2.9* 2.7*   Recent Labs  Lab 08/08/17 1720  LIPASE 32   CBC: Recent Labs  Lab 08/08/17 1720 08/09/17 0415 08/10/17 1430 08/11/17 0046 08/12/17 0212  WBC 4.8 5.1 4.5 4.2 4.2  NEUTROABS 3.5  --   --   --   --   HGB 10.6* 10.5* 9.7* 9.7* 10.2*  HCT 31.5* 31.9* 30.0* 29.3* 31.7*  MCV 86.5 86.9 87.7 87.7 89.3  PLT 147* 140* 116* 95* 109*    Cardiac Enzymes: Recent Labs  Lab 08/08/17 1720 08/08/17 2155 08/09/17 0332  TROPONINI 0.09* 0.09* 0.09*    Lab Results  Component Value Date   INR 1.37 08/10/2017   INR 1.59 08/09/2017   INR 1.22 07/27/2017   Studies/Results: No results found.  Medications: . heparin 1,850 Units/hr (08/12/17 1134)   . amLODipine  10 mg Oral QHS  . atorvastatin  10 mg Oral q1800  . carvedilol  25 mg Oral BID WC  . [START ON 08/13/2017] cloNIDine  0.3 mg Transdermal Q Wed  . [START ON 08/20/2017] darbepoetin (ARANESP) injection - DIALYSIS  100 mcg Intravenous Q Wed-HD  . dicyclomine  20 mg Oral Q6H  . doxazosin  4 mg Oral QHS  . feeding supplement (NEPRO CARB STEADY)  237 mL Oral TID BM  . isosorbide mononitrate  60 mg Oral Daily  . pantoprazole  40 mg Oral Daily  . sevelamer carbonate  2,400 mg Oral TID WC    Dialysis Orders: [facility]   4h  79kg  2/2.25  AVF  Hep 2000 - Venofer 59mg IV weekly - Mircera 1527m IV q 2 weeks (last 1/11) - Hectoral 26m26mIV q HD  Assessment: 1. Abdominal pain/pancreatic pseudocyst - for possible drain placement 2. ESRD - on for  HD today off sched (refused yest due to expected procedure) 3. HTN/volume - wts up today, asymptomatic 4. Anemia of CKD- Hgb 9.7, added132mg Aranesp qwk to start Wed.  5. Secondary hyperparathyroidism - Ca 8.9, P 5.4. Continue VDRA. 6. Nutrition - Renal diet, Nepro, Renavite 7. Type 2 DM - per primary 8. NICM (EF 35-40%) - per primary 9. Hx DVT (on warfarin) - Holding warfarin due to possible procedure. per primary/pharm  P - HD today and tomorrow, get vol down, no hep  RKelly SplinterMD CNewell Rubbermaidpager [phone]  08/12/2017, 1:31 PM

## 2017-08-12 NOTE — Care Management Note (Addendum)
Case Management Note  Patient Details  Name: Frank [name] MRN: [id number] Date of Birth: 08-Feb-1966  Subjective/Objective:                    Action/Plan:  PTA independent from home.  Pt was a no show on last [facility] appt 07/30/17.  Pt in agreement with CM attempting to establish follow up appts.  CM was able to set up post discharge appt for 2/4 at 3pm,  CM informed that clinic only performs INR checks on Tuesdays and Thursday - appt made for pt 1/24 at 10:30am.  Attending made aware of appts.  Bedside nurse informed of appts added to AVS.  CM informed pt of appts made.        Expected Discharge Date:  08/12/17               Expected Discharge Plan:  Home/Self Care  In-House Referral:     Discharge planning Services  CM Consult, [facility]  Post Acute Care Choice:    Choice offered to:     DME Arranged:    DME Agency:     HH Arranged:    HH Agency:     Status of Service:  Completed, signed off  If discussed at [employer], dates discussed:    Additional Comments:  Maryclare Labrador, RN 08/12/2017, 4:00 PM

## 2017-08-12 NOTE — Progress Notes (Signed)
[name]: [phone]  Patient name: Frank [name] Medical record number: [id number] Date of birth: 09-25-1965 Age: 52 y.o. Gender: male  Primary Care Provider: Lolita Patella, MD Consultants: GI Code Status: Full code  Pt Overview and Major Events to Date:  1/18: Admitted with abdominal pain  Assessment and Plan: Frank [name] is a 52 y.o. male presenting with progressive epigastric pain, chest pain, N/V for the past few days. PMH is significant for ESRD, HTN, HFrEF, NICM,DVT/PE, anxiety and depression, chronic pain syndrome, anemia of chronic disease, GERD, chronic pancreatitis, HLD, OSA, marijuana abuse.  Abdominal pain: Likely due to acute on chronic pancreatitis. Lipase within normal limits but patient with chronic pancreatitis. CT abdomen with enlarged pancreatic pseudocyst from prior. There is also bowel air-fluid levels on abdominal series in ED concerning for ileus in the RLQ but not evidenced by CT. Continues to complain of epigastric pain although able to tolerate regular diet. IR consulted who do not recommend external drain but suggested getting CT Abd with contrast for better assessment of pseudocyst. GI consulted who stated may be able to place internal drain but may would need transfer to tertiary center if not available to get here. - Appreciate GI recs - Dr. Benson Norway to see 1/21, awaiting recs. - Vitals per floor routine - PO Protonix - K pad - Tramadol 40m q12h PRN - Phenergan PRN nausea  - Renal diet  Chest Pain/epigastric pain: Partly musculoskeletal. Chest pain is reproducible on initial exam. Endorsing epigastric pain on exam this morning although able to eat without difficulty. It could also be due to his enlarging pseudocyst. Received Oxy IR 5 mg last night. EKG unchanged from prior. Troponin mildly elevated to 0.09 but stable.  He has ESRD.  -Continue home meds (Coreg, Imdur) -Added atorvastatin  NICM/HFrEF:  Last ECHO 10/2015 with EF 35-40%, diffuse hypokinesis, severe atrial enlargement. Does not appear fluid overloaded on exam. Dialyzing this morning. - Otherwise plan as noted above - continue home meds (see below)  ESRD (MWF): followed by [facility] history of noncompliance. Receiving HD at the time of exam.  - nephro following  Mild Transaminitis: AST 86. History of chronic intermittent elevations. Denies history of alcohol use.  - Monitor for now  - if elevates, can consider further evaluation   HTN: Mostly normotensive blood pressures. Supposedly on Coreg 2110m Clonidine 0.78m17match (weekly), Cardura 4mg51mmdur 60mg30mhome although has history of noncompliance. - continue home meds  DVT/PE:Chronic. Multiple known VTE. INR was subtherapeutic likely due to poor compliance with his warfarin. - Hold warfarin for possible drain of his pseudocyst - Heparin per pharmacy - PT/INR  Anxiety and depression:Chronic.Stable. - Will monitor,holding home amitriptyline.  Anemia of chronic disease:Chronic. Hgb 10.2 (at b/l). No signs of active bleeding, denies blood in vomit. Platelets stable at 147. - Monitor CBC  GERD:Chronic. On Protonix 40mg 79m - PO Protonix 40mg d39m  OSA:ChrURK:YHCWCBJully adherent to CPAP. - CPAP nightly  Marijuana abuse:Chronic. Denies recent use.  FEN/GI:  -Renal diet with fluid restriction.  Prophylaxis: On warfarin for history of DVT/PE  Disposition: Continue inpatient management.  Awaiting GI input.  Subjective:  Patient in HD at the time of exam. Intermittently sleeping on exam. Endorsing epigastric pain although able to eat without difficulty.  Objective: Temp:  [98.1 F (36.7 C)-98.6 F (37 C)] 98.1 F (36.7 C) (01/21 2214) Pulse Rate:  [60-71] 63 (01/22 0700) Resp:  [15-18] 15 (  01/22 0642) BP: (128-148)/(86-98) 148/98 (01/22 0700) SpO2:  [97 %-100 %] 99 % (01/22 0642) Weight:  [189 lb 13.1 oz (86.1 kg)] 189 lb 13.1 oz  (86.1 kg) (01/22 9528) Physical Exam: GEN: Lying in bed comfortably, sleeping, in NAD.  CVS: RRR, no murmurs, no edema RESP: no IWOB, good air movement bilaterally, CTAB GI: Patient pushing hand away during palpation, tender to light palpation diffusely. Soft, non distended. BS present & normal. MSK: Denies tenderness to palpation over his chest bilaterally SKIN: no apparent skin lesion NEURO: Oriented, intermittently sleeping, no gross deficits  Laboratory: Recent Labs  Lab 08/10/17 1430 08/11/17 0046 08/12/17 0212  WBC 4.5 4.2 4.2  HGB 9.7* 9.7* 10.2*  HCT 30.0* 29.3* 31.7*  PLT 116* 95* 109*   Recent Labs  Lab 08/08/17 1720 08/09/17 0415 08/10/17 1430 08/11/17 0046 08/12/17 0212  NA 137 135 135 139 134*  K 4.3 5.1 4.6 4.4 5.4*  CL 93* 94* 98* 101 98*  CO2 _0 21*  BUN 39* 46* 31* 40* 56*  CREATININE 7.53* 8.64* 6.96* 8.02* 10.21*  CALCIUM 9.3 9.3 8.9 9.0 9.0  PROT 7.5 7.1  --   --   --   BILITOT 1.7* 1.9*  --   --   --   ALKPHOS 138* 117  --   --   --   ALT 60 58  --   --   --   AST 86* 75*  --   --   --   GLUCOSE 74 54* 135* 121* 96    Imaging/Diagnostic Tests: Ct Abdomen Pelvis Wo Contrast  Result Date: 08/08/2017 CLINICAL DATA:  52 year old male with history of chest pain and abdominal pain. EXAM: CT ABDOMEN AND PELVIS WITHOUT CONTRAST TECHNIQUE: Multidetector CT imaging of the abdomen and pelvis was performed following the standard protocol without IV contrast. COMPARISON:  CT the abdomen and pelvis 06/24/2017. FINDINGS: Lower chest: Cardiomegaly. Small amount of pericardial fluid and/or thickening, unlikely to be of any hemodynamic significance at this time. No pericardial calcifications. Hepatobiliary: No definite cystic or solid hepatic lesions are confidently identified on today's noncontrast CT examination. Unenhanced appearance of the gallbladder is unremarkable. Pancreas: No definite pancreatic mass noted on today's noncontrast CT examination.  Increasing fluid adjacent to the body and tail of the pancreas extending into the gastrosplenic ligament, currently measuring 8.0 x 10.7 x 7.6 cm (axial image 31 of series 3 and coronal image 74 of series 6), potentially an enlarging pancreatic pseudocyst. Spleen: Unremarkable. Adrenals/Urinary Tract: Severe atrophy of both kidneys. Low-attenuation lesions in the left kidney, incompletely characterized on today's noncontrast CT examination, largest of which measures up to 3.1 cm in diameter, likely to represent cysts. No hydroureteronephrosis. Urinary bladder is normal in appearance. Bilateral adrenal glands are normal in appearance. Stomach/Bowel: Normal appearance of the stomach, with exception of some adjacent fluid in the gastrosplenic ligament (discussed above). No pathologic dilatation of small bowel or colon. The appendix is not confidently identified and may be surgically absent. Regardless, there are no inflammatory changes noted adjacent to the cecum to suggest the presence of an acute appendicitis at this time. Vascular/Lymphatic: Aortic atherosclerosis without evidence of aneurysm in the abdominal or pelvic vasculature. No lymphadenopathy noted in the abdomen or pelvis. Reproductive: Prostate gland and seminal vesicles are unremarkable in appearance. Other: Soft tissue mass in the left iliac fossa is presumably an old renal transplant, with extensive calcifications in the vascular pedicle, similar to prior studies. Trace volume of ascites. No  pneumoperitoneum. Musculoskeletal: There are no aggressive appearing lytic or blastic lesions noted in the visualized portions of the skeleton. IMPRESSION: 1. Increasing fluid in the gastrosplenic ligament, suspicious for an enlarging pancreatic pseudocyst. Clinical correlation for signs and symptoms of pancreatitis is recommended. 2. Trace volume of ascites. 3. Cardiomegaly. 4. Aortic atherosclerosis. 5. Additional incidental findings, as above. Aortic  Atherosclerosis (ICD10-I70.0). Electronically Signed   By: Vinnie Langton M.D.   On: 08/08/2017 20:56   Dg Abdomen Acute W/chest  Result Date: 08/08/2017 CLINICAL DATA:  Chest pain and abdominal pain. EXAM: DG ABDOMEN ACUTE W/ 1V CHEST COMPARISON:  Chest radiograph July 03, 2017. FINDINGS: The cardiac silhouette is moderately enlarged and unchanged. Calcified aortic knob. Pulmonary vascular congestion and diffuse interstitial prominence without pleural effusion or focal consolidation. No pneumothorax. Soft tissue planes and included osseous structures are non suspicious. Small bowel air-fluid levels RIGHT lower quadrant. No small and large bowel distension. Severe aortoiliac vascular calcifications. No intra-abdominal mass effect. Soft tissue planes and included osseous structures are non suspicious. IMPRESSION: Stable cardiomegaly and interstitial edema. Focal ileus RIGHT abdomen, less likely early versus nonacute bowel obstruction. Aortic Atherosclerosis (ICD10-I70.0). Electronically Signed   By: Elon Alas M.D.   On: 08/08/2017 18:38   Rory Percy, DO 08/12/2017, 8:04 AM PGY-1, [place] Intern pager: [phone], text pages welcome

## 2017-08-12 NOTE — Progress Notes (Signed)
ANTICOAGULATION CONSULT NOTE - Follow Up Consult  Pharmacy Consult for Heparin (warfarin on hold) Indication: Hx DVT/PE  Patient Measurements: Height: _0  (188 cm) Weight: 173 lb (78.5 kg) IBW/kg (Calculated) : 82.2 Heparin Dosing Weight: 78.5 kg  Vital Signs: Temp: 98.1 F (36.7 C) (01/21 2214) Temp Source: Oral (01/21 2214) BP: 135/91 (01/21 2214) Pulse Rate: 68 (01/22 0031)  Labs: Recent Labs    08/09/17 0332  08/10/17 1430 08/11/17 0046 08/11/17 0540 08/11/17 1317 08/12/17 0212  HGB  --    < > 9.7* 9.7*  --   --  10.2*  HCT  --    < > 30.0* 29.3*  --   --  31.7*  PLT  --    < > 116* 95*  --   --  109*  LABPROT 18.8*  --  16.8*  --   --   --   --   INR 1.59  --  1.37  --   --   --   --   HEPARINUNFRC  --    < > <0.10*  --  0.22* 0.10* 0.19*  CREATININE  --    < > 6.96* 8.02*  --   --  10.21*  TROPONINI 0.09*  --   --   --   --   --   --    < > = values in this interval not displayed.    Estimated Creatinine Clearance: 9.3 mL/min (A) (by C-G formula based on SCr of 10.21 mg/dL (H)).   Medications:  Scheduled:  . amLODipine  10 mg Oral QHS  . atorvastatin  10 mg Oral q1800  . carvedilol  25 mg Oral BID WC  . [START ON 08/13/2017] cloNIDine  0.3 mg Transdermal Q Wed  . darbepoetin (ARANESP) injection - DIALYSIS  100 mcg Intravenous Q Mon-HD  . dicyclomine  20 mg Oral Q6H  . doxazosin  4 mg Oral QHS  . feeding supplement (NEPRO CARB STEADY)  237 mL Oral TID BM  . isosorbide mononitrate  60 mg Oral Daily  . pantoprazole  40 mg Oral Daily  . sevelamer carbonate  2,400 mg Oral TID WC   Infusions:  . sodium chloride    . sodium chloride    . heparin 1,800 Units/hr (08/11/17 2336)    Assessment: 52 y/o male on warfarin PTA for hx of recurrent DVT/PE, noncompliance, admitted for abdominal pain and possible pseudocyst drainage. Pharmacy consulted to hold warfarin and dose heparin while awaiting possible procedure.    Heparin level is low at 0.19, however, pt  has disconnected himself from the drip multiple times over the last 6 hours for >30 minutes each time.   Goal of Therapy:  Heparin level 0.3-0.7 units/ml Monitor platelets by anticoagulation protocol: Yes   Plan:  Cont heparin at 1800 units/hr given the above situation  Re-check heparin level at [place], PharmD, [address] Pharmacist Phone: [phone]

## 2017-08-12 NOTE — Discharge Summary (Signed)
[facility] Discharge Summary  Patient name: Frank [name] Medical record number: [id number] Date of birth: [birth year] Age: 52 y.o. Gender: male Date of Admission: 08/08/2017  Date of Discharge: 08/12/2017 Admitting Physician: Blane Ohara McDiarmid, MD  Primary Care Provider: Lolita Patella, MD Consultants: GI  Indication for Hospitalization: Acute on Chronic Pancreatitis  Discharge Diagnoses/Problem List:  Acute on Chronic Pancreatitis HFrEF ESRD, MWF HD HTN H/o DVT/PE Anxiety/Depression Anemia of Chronic Disease GERD OSA  Disposition: Home  Discharge Condition: Improved  Discharge Exam:  GEN: Lying in bed comfortably, sleeping, in NAD.  CVS: RRR, no murmurs, no edema RESP: no IWOB, good air movement bilaterally, CTAB GI: Patient pushing hand away during palpation, tender to light palpation diffusely. Soft, non distended. BS present & normal. MSK: Denies tenderness to palpation over his chest bilaterally SKIN: no apparent skin lesion NEURO: Oriented, intermittently sleeping, no gross deficits  Brief Hospital Course:  Frank [name] a 52 y.o.malewho presented with progressive epigastric pain, chest pain, N/Vfor the past few days consistent with acute on chronic pancreatitis. In the ED, obtained acute abdominal series that revealed small bowel air-fluid levels concerning for ileus in the RLQ not evidenced by CT. CT abdomen was concerning for possible enlarging pancreatic pseudocyst, gallbladder without inflammation or gallstones. GI and IR were consulted for enlarged pseudocyst who did not recommend drainage at this facility. Because vitals remained stable and patient was able to tolerate a diet throughout entire hospitalization, was not deemed necessary for drainage at this time. Patient was followed by Nephrology during this admission with dialysis sessions as appropriate. He can resume his normal schedule on discharge.  Issues for Follow Up:   1. Medication Changes:  1. Patient was discharged with 10 day supply of Tramadol for pain PRN. 2. Patient was discharged on home Warfarin 7.34m daily, he should follow up later this week for INR check. 3. Patient received dialysis while here, he is able to resume normal schedule on discharge.   Significant Procedures: None  Significant Labs and Imaging:  Recent Labs  Lab 08/10/17 1430 08/11/17 0046 08/12/17 0212  WBC 4.5 4.2 4.2  HGB 9.7* 9.7* 10.2*  HCT 30.0* 29.3* 31.7*  PLT 116* 95* 109*   Recent Labs  Lab 08/08/17 1720 08/09/17 0415 08/10/17 1430 08/11/17 0046 08/12/17 0212  NA 137 135 135 139 134*  K 4.3 5.1 4.6 4.4 5.4*  CL 93* 94* 98* 101 98*  CO2 _0 21*  GLUCOSE 74 54* 135* 121* 96  BUN 39* 46* 31* 40* 56*  CREATININE 7.53* 8.64* 6.96* 8.02* 10.21*  CALCIUM 9.3 9.3 8.9 9.0 9.0  PHOS  --   --  5.4*  --   --   ALKPHOS 138* 117  --   --   --   AST 86* 75*  --   --   --   ALT 60 58  --   --   --   ALBUMIN 3.1* 2.9* 2.7*  --   --     1/18 Ct Abdomen Pelvis Wo Contrast  FINDINGS: Lower chest: Cardiomegaly. Small amount of pericardial fluid and/or thickening, unlikely to be of any hemodynamic significance at this time. No pericardial calcifications. Hepatobiliary: No definite cystic or solid hepatic lesions are confidently identified on today's noncontrast CT examination. Unenhanced appearance of the gallbladder is unremarkable. Pancreas: No definite pancreatic mass noted on today's noncontrast CT examination. Increasing fluid adjacent to the body and tail of the pancreas extending into  the gastrosplenic ligament, currently measuring 8.0 x 10.7 x 7.6 cm (axial image 31 of series 3 and coronal image 74 of series 6), potentially an enlarging pancreatic pseudocyst. Spleen: Unremarkable. Adrenals/Urinary Tract: Severe atrophy of both kidneys. Low-attenuation lesions in the left kidney, incompletely characterized on today's noncontrast CT examination, largest of  which measures up to 3.1 cm in diameter, likely to represent cysts. No hydroureteronephrosis. Urinary bladder is normal in appearance. Bilateral adrenal glands are normal in appearance. Stomach/Bowel: Normal appearance of the stomach, with exception of some adjacent fluid in the gastrosplenic ligament (discussed above). No pathologic dilatation of small bowel or colon. The appendix is not confidently identified and may be surgically absent. Regardless, there are no inflammatory changes noted adjacent to the cecum to suggest the presence of an acute appendicitis at this time. Vascular/Lymphatic: Aortic atherosclerosis without evidence of aneurysm in the abdominal or pelvic vasculature. No lymphadenopathy noted in the abdomen or pelvis. Reproductive: Prostate gland and seminal vesicles are unremarkable in appearance. Other: Soft tissue mass in the left iliac fossa is presumably an old renal transplant, with extensive calcifications in the vascular pedicle, similar to prior studies. Trace volume of ascites. No pneumoperitoneum. Musculoskeletal: There are no aggressive appearing lytic or blastic lesions noted in the visualized portions of the skeleton.  IMPRESSION:  1. Increasing fluid in the gastrosplenic ligament, suspicious for an enlarging pancreatic pseudocyst. Clinical correlation for signs and symptoms of pancreatitis is recommended. 2. Trace volume of ascites.  3. Cardiomegaly.  4. Aortic atherosclerosis.  5. Additional incidental findings, as above.  Aortic Atherosclerosis (ICD10-I70.0).   1/18 Dg Abdomen Acute W/chest FINDINGS: The cardiac silhouette is moderately enlarged and unchanged. Calcified aortic knob. Pulmonary vascular congestion and diffuse interstitial prominence without pleural effusion or focal consolidation. No pneumothorax. Soft tissue planes and included osseous structures are non suspicious. Small bowel air-fluid levels RIGHT lower quadrant. No small and large bowel distension.  Severe aortoiliac vascular calcifications. No intra-abdominal mass effect. Soft tissue planes and included osseous structures are non suspicious.  IMPRESSION: Stable cardiomegaly and interstitial edema. Focal ileus RIGHT abdomen, less likely early versus nonacute bowel obstruction. Aortic Atherosclerosis (ICD10-I70.0).   Results/Tests Pending at Time of Discharge: None  Discharge Medications:  Allergies as of 08/12/2017      Reactions   Butalbital-apap-caffeine Shortness Of Breath, Swelling, Other (See Comments)   Swelling in throat   Ferrlecit [na Ferric Gluc Cplx In Sucrose] Shortness Of Breath, Swelling, Other (See Comments)   Swelling in throat, tolerates Venofor   Minoxidil Shortness Of Breath   Tylenol [acetaminophen] Anaphylaxis, Swelling   Darvocet [propoxyphene N-acetaminophen] Hives      Medication List    STOP taking these medications   oxyCODONE 5 MG immediate release tablet Commonly known as:  ROXICODONE     TAKE these medications   amitriptyline 10 MG tablet Commonly known as:  ELAVIL Take 1 tablet (10 mg total) by mouth at bedtime.   amLODipine 10 MG tablet Commonly known as:  NORVASC Take 1 tablet (10 mg total) by mouth at bedtime.   atorvastatin 10 MG tablet Commonly known as:  LIPITOR Take 1 tablet (10 mg total) by mouth daily at 6 PM.   carvedilol 25 MG tablet Commonly known as:  COREG Take 1 tablet (25 mg total) by mouth 2 (two) times daily with a meal.   cloNIDine 0.3 mg/24hr patch Commonly known as:  CATAPRES - Dosed in mg/24 hr Place 0.3 mg onto the skin every Wednesday.   dicyclomine 20 MG  tablet Commonly known as:  BENTYL Take 20 mg by mouth every 6 (six) hours.   docusate sodium 100 MG capsule Commonly known as:  COLACE Take 1 capsule (100 mg total) by mouth 2 (two) times daily.   doxazosin 4 MG tablet Commonly known as:  CARDURA Take 4 mg by mouth at bedtime.   gabapentin 100 MG capsule Commonly known as:  NEURONTIN Take 100 mg by  mouth daily.   hydrALAZINE 100 MG tablet Commonly known as:  APRESOLINE Take 100 mg by mouth daily.   isosorbide mononitrate 60 MG 24 hr tablet Commonly known as:  IMDUR Take 1 tablet (60 mg total) by mouth daily.   metoCLOPramide 5 MG tablet Commonly known as:  REGLAN Take 1 tablet (5 mg total) by mouth every 8 (eight) hours as needed for nausea or vomiting.   ondansetron 4 MG disintegrating tablet Commonly known as:  ZOFRAN-ODT Take 1 tablet (4 mg total) by mouth every 8 (eight) hours as needed for nausea. 49m ODT q4 hours prn nausea   pantoprazole 40 MG tablet Commonly known as:  PROTONIX Take 1 tablet (40 mg total) by mouth 2 (two) times daily before a meal.   polyethylene glycol packet Commonly known as:  MIRALAX / GLYCOLAX Take 17 g by mouth daily as needed for mild constipation.   Respiratory Therapy Supplies Misc 1 each daily. CPAP MACHINE   sevelamer carbonate 800 MG tablet Commonly known as:  RENVELA Take 3 tablets (2,400 mg total) by mouth 3 (three) times daily with meals.   traMADol 50 MG tablet Commonly known as:  ULTRAM Take 0.5 tablets (25 mg total) by mouth every 12 (twelve) hours as needed for up to 10 days for moderate pain.   warfarin 7.5 MG tablet Commonly known as:  COUMADIN Take 1 tablet (7.5 mg total) by mouth daily. Today and tomorrow. See coumadin pharmacist for further management.       Discharge Instructions: Please refer to Patient Instructions section of EMR for full details.  Patient was counseled important signs and symptoms that should prompt return to medical care, changes in medications, dietary instructions, activity restrictions, and follow up appointments.   Follow-Up Appointments: Follow-up Information    [place], IDoralee Albino MD Follow up.   Contact information: [phone]       [place] Go on 08/14/2017.   Why:  For INR check this week. Contact  information: [id number] APreston DO 08/12/2017, 4:04 PM PGY-1, [place]

## 2017-08-12 NOTE — Progress Notes (Signed)
PT Cancellation Note  Patient Details Name: Frank [name] MRN: [id number] DOB: 1965/07/27   Cancelled Treatment:    Reason Eval/Treat Not Completed: Patient at procedure or test/unavailable. Pt in HD. PT to re-attempt eval as time allows.   Lorriane Shire 08/12/2017, 8:29 AM

## 2017-08-12 NOTE — Progress Notes (Signed)
Frank [name] to be D/C'd Home per MD order.  Discussed prescriptions and follow up appointments with the patient. Prescriptions given to patient, medication list explained in detail. Pt verbalized understanding.  Allergies as of 08/12/2017      Reactions   Butalbital-apap-caffeine Shortness Of Breath, Swelling, Other (See Comments)   Swelling in throat   Ferrlecit [na Ferric Gluc Cplx In Sucrose] Shortness Of Breath, Swelling, Other (See Comments)   Swelling in throat, tolerates Venofor   Minoxidil Shortness Of Breath   Tylenol [acetaminophen] Anaphylaxis, Swelling   Darvocet [propoxyphene N-acetaminophen] Hives      Medication List    STOP taking these medications   oxyCODONE 5 MG immediate release tablet Commonly known as:  ROXICODONE     TAKE these medications   amitriptyline 10 MG tablet Commonly known as:  ELAVIL Take 1 tablet (10 mg total) by mouth at bedtime.   amLODipine 10 MG tablet Commonly known as:  NORVASC Take 1 tablet (10 mg total) by mouth at bedtime.   atorvastatin 10 MG tablet Commonly known as:  LIPITOR Take 1 tablet (10 mg total) by mouth daily at 6 PM.   carvedilol 25 MG tablet Commonly known as:  COREG Take 1 tablet (25 mg total) by mouth 2 (two) times daily with a meal.   cloNIDine 0.3 mg/24hr patch Commonly known as:  CATAPRES - Dosed in mg/24 hr Place 0.3 mg onto the skin every Wednesday.   dicyclomine 20 MG tablet Commonly known as:  BENTYL Take 20 mg by mouth every 6 (six) hours.   docusate sodium 100 MG capsule Commonly known as:  COLACE Take 1 capsule (100 mg total) by mouth 2 (two) times daily.   doxazosin 4 MG tablet Commonly known as:  CARDURA Take 4 mg by mouth at bedtime.   gabapentin 100 MG capsule Commonly known as:  NEURONTIN Take 100 mg by mouth daily.   hydrALAZINE 100 MG tablet Commonly known as:  APRESOLINE Take 100 mg by mouth daily.   isosorbide mononitrate 60 MG 24 hr tablet Commonly known as:  IMDUR Take 1  tablet (60 mg total) by mouth daily.   metoCLOPramide 5 MG tablet Commonly known as:  REGLAN Take 1 tablet (5 mg total) by mouth every 8 (eight) hours as needed for nausea or vomiting.   ondansetron 4 MG disintegrating tablet Commonly known as:  ZOFRAN-ODT Take 1 tablet (4 mg total) by mouth every 8 (eight) hours as needed for nausea. 33m ODT q4 hours prn nausea   pantoprazole 40 MG tablet Commonly known as:  PROTONIX Take 1 tablet (40 mg total) by mouth 2 (two) times daily before a meal.   polyethylene glycol packet Commonly known as:  MIRALAX / GLYCOLAX Take 17 g by mouth daily as needed for mild constipation.   Respiratory Therapy Supplies Misc 1 each daily. CPAP MACHINE   sevelamer carbonate 800 MG tablet Commonly known as:  RENVELA Take 3 tablets (2,400 mg total) by mouth 3 (three) times daily with meals.   traMADol 50 MG tablet Commonly known as:  ULTRAM Take 0.5 tablets (25 mg total) by mouth every 12 (twelve) hours as needed for up to 10 days for moderate pain.   warfarin 7.5 MG tablet Commonly known as:  COUMADIN Take 1 tablet (7.5 mg total) by mouth daily. Today and tomorrow. See coumadin pharmacist for further management.       Vitals:   08/12/17 1056 08/12/17 1152  BP: 134/85 (!) 150/91  Pulse: 75  84  Resp: 20 18  Temp: (!) 97.5 F (36.4 C) 97.8 F (36.6 C)  SpO2: 99% 98%    Skin clean, dry and intact without evidence of skin break down, no evidence of skin tears noted. IV catheter discontinued intact. Site without signs and symptoms of complications. Dressing and pressure applied. Pt denies pain at this time. No complaints noted.  An After Visit Summary was printed and given to the patient. Patient escorted via [place], and D/C home via private auto.  Chuck Hint RN [facility] [employer]

## 2017-08-13 DIAGNOSIS — I5022 Chronic systolic (congestive) heart failure: Secondary | ICD-10-CM | POA: Diagnosis not present

## 2017-08-13 DIAGNOSIS — N186 End stage renal disease: Secondary | ICD-10-CM | POA: Insufficient documentation

## 2017-08-13 DIAGNOSIS — Z87891 Personal history of nicotine dependence: Secondary | ICD-10-CM | POA: Diagnosis not present

## 2017-08-13 DIAGNOSIS — E1122 Type 2 diabetes mellitus with diabetic chronic kidney disease: Secondary | ICD-10-CM | POA: Insufficient documentation

## 2017-08-13 DIAGNOSIS — Z7901 Long term (current) use of anticoagulants: Secondary | ICD-10-CM | POA: Diagnosis not present

## 2017-08-13 DIAGNOSIS — L97521 Non-pressure chronic ulcer of other part of left foot limited to breakdown of skin: Secondary | ICD-10-CM | POA: Insufficient documentation

## 2017-08-13 DIAGNOSIS — M25572 Pain in left ankle and joints of left foot: Secondary | ICD-10-CM | POA: Diagnosis not present

## 2017-08-13 DIAGNOSIS — Z79899 Other long term (current) drug therapy: Secondary | ICD-10-CM | POA: Insufficient documentation

## 2017-08-13 DIAGNOSIS — Z992 Dependence on renal dialysis: Secondary | ICD-10-CM | POA: Insufficient documentation

## 2017-08-13 DIAGNOSIS — I132 Hypertensive heart and chronic kidney disease with heart failure and with stage 5 chronic kidney disease, or end stage renal disease: Secondary | ICD-10-CM | POA: Diagnosis not present

## 2017-08-13 DIAGNOSIS — Z86718 Personal history of other venous thrombosis and embolism: Secondary | ICD-10-CM | POA: Diagnosis not present

## 2017-08-13 DIAGNOSIS — M79672 Pain in left foot: Secondary | ICD-10-CM | POA: Diagnosis present

## 2017-08-14 ENCOUNTER — Emergency Department (HOSPITAL_COMMUNITY)
Admission: EM | Admit: 2017-08-14 | Discharge: 2017-08-14 | Disposition: A | Discharge status: Home / Self Care | Payer: Medicare Other | Attending: Emergency Medicine | Admitting: Emergency Medicine

## 2017-08-14 ENCOUNTER — Other Ambulatory Visit: Payer: Self-pay

## 2017-08-14 ENCOUNTER — Encounter: Payer: Medicare Other | Admitting: Pharmacist

## 2017-08-14 ENCOUNTER — Encounter (HOSPITAL_COMMUNITY): Payer: Self-pay

## 2017-08-14 DIAGNOSIS — M79672 Pain in left foot: Secondary | ICD-10-CM

## 2017-08-14 DIAGNOSIS — L97521 Non-pressure chronic ulcer of other part of left foot limited to breakdown of skin: Secondary | ICD-10-CM

## 2017-08-14 MED ORDER — LIDOCAINE VISCOUS 2 % MT SOLN
15.0000 mL | Freq: Once | OROMUCOSAL | Status: AC
  Administered 2017-08-14: 15 mL via OROMUCOSAL
  Filled 2017-08-14: qty 15

## 2017-08-14 NOTE — ED Triage Notes (Signed)
Pt reports L great toe pain x4 days. Skin on feet is very dry, but no redness noted. His last dialysis was yesterday. A&Ox4. Ambulatory.
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
## 2017-08-15 ENCOUNTER — Encounter (HOSPITAL_COMMUNITY): Payer: Self-pay | Admitting: Emergency Medicine

## 2017-08-15 ENCOUNTER — Emergency Department (HOSPITAL_COMMUNITY)
Admission: EM | Admit: 2017-08-15 | Discharge: 2017-08-15 | Disposition: A | Discharge status: Home / Self Care | Payer: Medicare Other | Attending: Emergency Medicine | Admitting: Emergency Medicine

## 2017-08-15 DIAGNOSIS — Z5321 Procedure and treatment not carried out due to patient leaving prior to being seen by health care provider: Secondary | ICD-10-CM | POA: Diagnosis not present

## 2017-08-15 DIAGNOSIS — Z86718 Personal history of other venous thrombosis and embolism: Secondary | ICD-10-CM | POA: Diagnosis not present

## 2017-08-15 DIAGNOSIS — N2581 Secondary hyperparathyroidism of renal origin: Secondary | ICD-10-CM | POA: Diagnosis not present

## 2017-08-15 DIAGNOSIS — D631 Anemia in chronic kidney disease: Secondary | ICD-10-CM | POA: Diagnosis not present

## 2017-08-15 DIAGNOSIS — R109 Unspecified abdominal pain: Secondary | ICD-10-CM | POA: Diagnosis not present

## 2017-08-15 DIAGNOSIS — N186 End stage renal disease: Secondary | ICD-10-CM | POA: Diagnosis not present

## 2017-08-15 DIAGNOSIS — R112 Nausea with vomiting, unspecified: Secondary | ICD-10-CM | POA: Diagnosis not present

## 2017-08-15 LAB — COMPREHENSIVE METABOLIC PANEL
ALBUMIN: 3.2 g/dL — AB (ref 3.5–5.0)
ALT: 32 U/L (ref 17–63)
ANION GAP: 20 — AB (ref 5–15)
AST: 34 U/L (ref 15–41)
Alkaline Phosphatase: 116 U/L (ref 38–126)
BUN: 100 mg/dL — ABNORMAL HIGH (ref 6–20)
CO2: 19 mmol/L — AB (ref 22–32)
Calcium: 9.2 mg/dL (ref 8.9–10.3)
Chloride: 100 mmol/L — ABNORMAL LOW (ref 101–111)
Creatinine, Ser: 12.51 mg/dL — ABNORMAL HIGH (ref 0.61–1.24)
GFR calc Af Amer: 5 mL/min — ABNORMAL LOW (ref >60)
GFR calc non Af Amer: 4 mL/min — ABNORMAL LOW (ref >60)
GLUCOSE: 69 mg/dL (ref 65–99)
POTASSIUM: 6.5 mmol/L — AB (ref 3.5–5.1)
SODIUM: 139 mmol/L (ref 135–145)
Total Bilirubin: 1 mg/dL (ref 0.3–1.2)
Total Protein: 7.8 g/dL (ref 6.5–8.1)

## 2017-08-15 LAB — CBC
HCT: 33.4 % — ABNORMAL LOW (ref 39.0–52.0)
HEMOGLOBIN: 10.9 g/dL — AB (ref 13.0–17.0)
MCH: 28.8 pg (ref 26.0–34.0)
MCHC: 32.6 g/dL (ref 30.0–36.0)
MCV: 88.1 fL (ref 78.0–100.0)
Platelets: 143 10*3/uL — ABNORMAL LOW (ref 150–400)
RBC: 3.79 MIL/uL — ABNORMAL LOW (ref 4.22–5.81)
RDW: 19.2 % — ABNORMAL HIGH (ref 11.5–15.5)
WBC: 4.9 10*3/uL (ref 4.0–10.5)

## 2017-08-15 LAB — LIPASE, BLOOD: Lipase: 28 U/L (ref 11–51)

## 2017-08-15 MED ORDER — ONDANSETRON 4 MG PO TBDP: 4.0000 mg | ORAL_TABLET | Freq: Once | ORAL | Status: DC | PRN

## 2017-08-15 NOTE — ED Notes (Signed)
Pt found wandering in atrium. Returned to ED by Judson Roch RT.

## 2017-08-15 NOTE — ED Notes (Signed)
Pt not wiling to wait to be seen, going to dialysis.

## 2017-08-15 NOTE — ED Triage Notes (Signed)
Pt reports abdominal pain, nausea, vomiting and "high potassium". States he had dialysis yesterday.

## 2017-08-18 DIAGNOSIS — Z888 Allergy status to other drugs, medicaments and biological substances status: Secondary | ICD-10-CM | POA: Diagnosis not present

## 2017-08-18 DIAGNOSIS — I5040 Unspecified combined systolic (congestive) and diastolic (congestive) heart failure: Secondary | ICD-10-CM | POA: Diagnosis not present

## 2017-08-18 DIAGNOSIS — I712 Thoracic aortic aneurysm, without rupture: Secondary | ICD-10-CM | POA: Diagnosis not present

## 2017-08-18 DIAGNOSIS — I313 Pericardial effusion (noninflammatory): Secondary | ICD-10-CM | POA: Diagnosis not present

## 2017-08-18 DIAGNOSIS — Z79899 Other long term (current) drug therapy: Secondary | ICD-10-CM | POA: Diagnosis not present

## 2017-08-18 DIAGNOSIS — R112 Nausea with vomiting, unspecified: Secondary | ICD-10-CM | POA: Diagnosis not present

## 2017-08-18 DIAGNOSIS — R911 Solitary pulmonary nodule: Secondary | ICD-10-CM | POA: Diagnosis not present

## 2017-08-18 DIAGNOSIS — G4733 Obstructive sleep apnea (adult) (pediatric): Secondary | ICD-10-CM | POA: Diagnosis not present

## 2017-08-18 DIAGNOSIS — Z992 Dependence on renal dialysis: Secondary | ICD-10-CM | POA: Diagnosis not present

## 2017-08-18 DIAGNOSIS — K861 Other chronic pancreatitis: Secondary | ICD-10-CM | POA: Diagnosis not present

## 2017-08-18 DIAGNOSIS — I5189 Other ill-defined heart diseases: Secondary | ICD-10-CM | POA: Diagnosis not present

## 2017-08-18 DIAGNOSIS — R072 Precordial pain: Secondary | ICD-10-CM | POA: Diagnosis not present

## 2017-08-18 DIAGNOSIS — R079 Chest pain, unspecified: Secondary | ICD-10-CM | POA: Diagnosis not present

## 2017-08-18 DIAGNOSIS — J811 Chronic pulmonary edema: Secondary | ICD-10-CM | POA: Diagnosis not present

## 2017-08-18 DIAGNOSIS — E875 Hyperkalemia: Secondary | ICD-10-CM | POA: Diagnosis not present

## 2017-08-18 DIAGNOSIS — R748 Abnormal levels of other serum enzymes: Secondary | ICD-10-CM | POA: Diagnosis not present

## 2017-08-18 DIAGNOSIS — I420 Dilated cardiomyopathy: Secondary | ICD-10-CM | POA: Diagnosis not present

## 2017-08-18 DIAGNOSIS — I429 Cardiomyopathy, unspecified: Secondary | ICD-10-CM | POA: Diagnosis not present

## 2017-08-18 DIAGNOSIS — I081 Rheumatic disorders of both mitral and tricuspid valves: Secondary | ICD-10-CM | POA: Diagnosis not present

## 2017-08-18 DIAGNOSIS — Z833 Family history of diabetes mellitus: Secondary | ICD-10-CM | POA: Diagnosis not present

## 2017-08-18 DIAGNOSIS — R590 Localized enlarged lymph nodes: Secondary | ICD-10-CM | POA: Diagnosis not present

## 2017-08-18 DIAGNOSIS — Z8249 Family history of ischemic heart disease and other diseases of the circulatory system: Secondary | ICD-10-CM | POA: Diagnosis not present

## 2017-08-18 DIAGNOSIS — I509 Heart failure, unspecified: Secondary | ICD-10-CM | POA: Diagnosis not present

## 2017-08-18 DIAGNOSIS — I723 Aneurysm of iliac artery: Secondary | ICD-10-CM | POA: Diagnosis not present

## 2017-08-18 DIAGNOSIS — D631 Anemia in chronic kidney disease: Secondary | ICD-10-CM | POA: Diagnosis not present

## 2017-08-18 DIAGNOSIS — R1013 Epigastric pain: Secondary | ICD-10-CM | POA: Diagnosis not present

## 2017-08-18 DIAGNOSIS — N185 Chronic kidney disease, stage 5: Secondary | ICD-10-CM | POA: Diagnosis not present

## 2017-08-18 DIAGNOSIS — N2889 Other specified disorders of kidney and ureter: Secondary | ICD-10-CM | POA: Diagnosis not present

## 2017-08-18 DIAGNOSIS — N186 End stage renal disease: Secondary | ICD-10-CM | POA: Diagnosis not present

## 2017-08-18 DIAGNOSIS — Z7901 Long term (current) use of anticoagulants: Secondary | ICD-10-CM | POA: Diagnosis not present

## 2017-08-18 DIAGNOSIS — E1122 Type 2 diabetes mellitus with diabetic chronic kidney disease: Secondary | ICD-10-CM | POA: Diagnosis not present

## 2017-08-18 DIAGNOSIS — R7989 Other specified abnormal findings of blood chemistry: Secondary | ICD-10-CM | POA: Diagnosis not present

## 2017-08-18 DIAGNOSIS — I132 Hypertensive heart and chronic kidney disease with heart failure and with stage 5 chronic kidney disease, or end stage renal disease: Secondary | ICD-10-CM | POA: Diagnosis not present

## 2017-08-18 DIAGNOSIS — R188 Other ascites: Secondary | ICD-10-CM | POA: Diagnosis not present

## 2017-08-18 DIAGNOSIS — I519 Heart disease, unspecified: Secondary | ICD-10-CM | POA: Diagnosis not present

## 2017-08-18 DIAGNOSIS — Z91048 Other nonmedicinal substance allergy status: Secondary | ICD-10-CM | POA: Diagnosis not present

## 2017-08-18 DIAGNOSIS — I517 Cardiomegaly: Secondary | ICD-10-CM | POA: Diagnosis not present

## 2017-08-18 DIAGNOSIS — R109 Unspecified abdominal pain: Secondary | ICD-10-CM | POA: Diagnosis not present

## 2017-08-18 DIAGNOSIS — I10 Essential (primary) hypertension: Secondary | ICD-10-CM | POA: Diagnosis not present

## 2017-08-18 DIAGNOSIS — R0602 Shortness of breath: Secondary | ICD-10-CM | POA: Diagnosis not present

## 2017-08-19 DIAGNOSIS — E875 Hyperkalemia: Secondary | ICD-10-CM | POA: Diagnosis not present

## 2017-08-19 DIAGNOSIS — N186 End stage renal disease: Secondary | ICD-10-CM | POA: Diagnosis not present

## 2017-08-19 DIAGNOSIS — K861 Other chronic pancreatitis: Secondary | ICD-10-CM | POA: Diagnosis not present

## 2017-08-19 DIAGNOSIS — I420 Dilated cardiomyopathy: Secondary | ICD-10-CM | POA: Diagnosis not present

## 2017-08-19 DIAGNOSIS — D631 Anemia in chronic kidney disease: Secondary | ICD-10-CM | POA: Diagnosis not present

## 2017-08-19 DIAGNOSIS — R112 Nausea with vomiting, unspecified: Secondary | ICD-10-CM | POA: Diagnosis not present

## 2017-08-19 DIAGNOSIS — Z992 Dependence on renal dialysis: Secondary | ICD-10-CM | POA: Diagnosis not present

## 2017-08-19 DIAGNOSIS — I5040 Unspecified combined systolic (congestive) and diastolic (congestive) heart failure: Secondary | ICD-10-CM | POA: Diagnosis not present

## 2017-08-19 DIAGNOSIS — I10 Essential (primary) hypertension: Secondary | ICD-10-CM | POA: Diagnosis not present

## 2017-08-19 DIAGNOSIS — I770 Arteriovenous fistula, acquired: Secondary | ICD-10-CM | POA: Diagnosis not present

## 2017-08-19 DIAGNOSIS — R748 Abnormal levels of other serum enzymes: Secondary | ICD-10-CM | POA: Diagnosis not present

## 2017-08-19 DIAGNOSIS — N185 Chronic kidney disease, stage 5: Secondary | ICD-10-CM | POA: Diagnosis not present

## 2017-08-20 DIAGNOSIS — I770 Arteriovenous fistula, acquired: Secondary | ICD-10-CM | POA: Diagnosis not present

## 2017-08-20 DIAGNOSIS — D631 Anemia in chronic kidney disease: Secondary | ICD-10-CM | POA: Diagnosis not present

## 2017-08-20 DIAGNOSIS — I5040 Unspecified combined systolic (congestive) and diastolic (congestive) heart failure: Secondary | ICD-10-CM | POA: Diagnosis not present

## 2017-08-20 DIAGNOSIS — E875 Hyperkalemia: Secondary | ICD-10-CM | POA: Diagnosis not present

## 2017-08-20 DIAGNOSIS — N185 Chronic kidney disease, stage 5: Secondary | ICD-10-CM | POA: Diagnosis not present

## 2017-08-20 DIAGNOSIS — R112 Nausea with vomiting, unspecified: Secondary | ICD-10-CM | POA: Diagnosis not present

## 2017-08-20 DIAGNOSIS — I420 Dilated cardiomyopathy: Secondary | ICD-10-CM | POA: Diagnosis not present

## 2017-08-20 DIAGNOSIS — N186 End stage renal disease: Secondary | ICD-10-CM | POA: Diagnosis not present

## 2017-08-20 DIAGNOSIS — I10 Essential (primary) hypertension: Secondary | ICD-10-CM | POA: Diagnosis not present

## 2017-08-20 DIAGNOSIS — R748 Abnormal levels of other serum enzymes: Secondary | ICD-10-CM | POA: Diagnosis not present

## 2017-08-20 DIAGNOSIS — Z992 Dependence on renal dialysis: Secondary | ICD-10-CM | POA: Diagnosis not present

## 2017-08-21 DIAGNOSIS — T8610 Unspecified complication of kidney transplant: Secondary | ICD-10-CM | POA: Diagnosis not present

## 2017-08-21 DIAGNOSIS — K861 Other chronic pancreatitis: Secondary | ICD-10-CM | POA: Diagnosis not present

## 2017-08-21 DIAGNOSIS — R112 Nausea with vomiting, unspecified: Secondary | ICD-10-CM | POA: Diagnosis not present

## 2017-08-21 DIAGNOSIS — Z992 Dependence on renal dialysis: Secondary | ICD-10-CM | POA: Diagnosis not present

## 2017-08-21 DIAGNOSIS — N186 End stage renal disease: Secondary | ICD-10-CM | POA: Diagnosis not present

## 2017-08-21 DIAGNOSIS — I770 Arteriovenous fistula, acquired: Secondary | ICD-10-CM | POA: Diagnosis not present

## 2017-08-21 DIAGNOSIS — I5040 Unspecified combined systolic (congestive) and diastolic (congestive) heart failure: Secondary | ICD-10-CM | POA: Diagnosis not present

## 2017-08-21 DIAGNOSIS — E875 Hyperkalemia: Secondary | ICD-10-CM | POA: Diagnosis not present

## 2017-08-21 DIAGNOSIS — D631 Anemia in chronic kidney disease: Secondary | ICD-10-CM | POA: Diagnosis not present

## 2017-08-21 DIAGNOSIS — I10 Essential (primary) hypertension: Secondary | ICD-10-CM | POA: Diagnosis not present

## 2017-08-21 DIAGNOSIS — I420 Dilated cardiomyopathy: Secondary | ICD-10-CM | POA: Diagnosis not present

## 2017-08-21 DIAGNOSIS — N185 Chronic kidney disease, stage 5: Secondary | ICD-10-CM | POA: Diagnosis not present

## 2017-08-21 DIAGNOSIS — R748 Abnormal levels of other serum enzymes: Secondary | ICD-10-CM | POA: Diagnosis not present

## 2017-08-22 DIAGNOSIS — N2581 Secondary hyperparathyroidism of renal origin: Secondary | ICD-10-CM | POA: Diagnosis not present

## 2017-08-22 DIAGNOSIS — Z992 Dependence on renal dialysis: Secondary | ICD-10-CM | POA: Diagnosis not present

## 2017-08-22 DIAGNOSIS — N186 End stage renal disease: Secondary | ICD-10-CM | POA: Diagnosis not present

## 2017-08-22 DIAGNOSIS — D631 Anemia in chronic kidney disease: Secondary | ICD-10-CM | POA: Diagnosis not present

## 2017-08-22 DIAGNOSIS — D509 Iron deficiency anemia, unspecified: Secondary | ICD-10-CM | POA: Diagnosis not present

## 2017-08-22 DIAGNOSIS — T8610 Unspecified complication of kidney transplant: Secondary | ICD-10-CM | POA: Diagnosis not present

## 2017-08-23 ENCOUNTER — Encounter (HOSPITAL_COMMUNITY): Payer: Self-pay | Admitting: Emergency Medicine

## 2017-08-23 DIAGNOSIS — E1122 Type 2 diabetes mellitus with diabetic chronic kidney disease: Secondary | ICD-10-CM | POA: Diagnosis present

## 2017-08-23 DIAGNOSIS — K863 Pseudocyst of pancreas: Secondary | ICD-10-CM | POA: Diagnosis not present

## 2017-08-23 DIAGNOSIS — D696 Thrombocytopenia, unspecified: Secondary | ICD-10-CM | POA: Diagnosis present

## 2017-08-23 DIAGNOSIS — K5900 Constipation, unspecified: Secondary | ICD-10-CM | POA: Diagnosis present

## 2017-08-23 DIAGNOSIS — R160 Hepatomegaly, not elsewhere classified: Secondary | ICD-10-CM | POA: Diagnosis present

## 2017-08-23 DIAGNOSIS — Z87891 Personal history of nicotine dependence: Secondary | ICD-10-CM

## 2017-08-23 DIAGNOSIS — Y839 Surgical procedure, unspecified as the cause of abnormal reaction of the patient, or of later complication, without mention of misadventure at the time of the procedure: Secondary | ICD-10-CM | POA: Diagnosis present

## 2017-08-23 DIAGNOSIS — N2581 Secondary hyperparathyroidism of renal origin: Secondary | ICD-10-CM | POA: Diagnosis present

## 2017-08-23 DIAGNOSIS — R0602 Shortness of breath: Secondary | ICD-10-CM | POA: Diagnosis not present

## 2017-08-23 DIAGNOSIS — G4733 Obstructive sleep apnea (adult) (pediatric): Secondary | ICD-10-CM | POA: Diagnosis present

## 2017-08-23 DIAGNOSIS — J9811 Atelectasis: Secondary | ICD-10-CM | POA: Diagnosis present

## 2017-08-23 DIAGNOSIS — Z9115 Patient's noncompliance with renal dialysis: Secondary | ICD-10-CM

## 2017-08-23 DIAGNOSIS — Z86718 Personal history of other venous thrombosis and embolism: Secondary | ICD-10-CM

## 2017-08-23 DIAGNOSIS — K861 Other chronic pancreatitis: Secondary | ICD-10-CM | POA: Diagnosis present

## 2017-08-23 DIAGNOSIS — R109 Unspecified abdominal pain: Secondary | ICD-10-CM | POA: Diagnosis not present

## 2017-08-23 DIAGNOSIS — F419 Anxiety disorder, unspecified: Secondary | ICD-10-CM | POA: Diagnosis present

## 2017-08-23 DIAGNOSIS — E8889 Other specified metabolic disorders: Secondary | ICD-10-CM | POA: Diagnosis present

## 2017-08-23 DIAGNOSIS — I081 Rheumatic disorders of both mitral and tricuspid valves: Secondary | ICD-10-CM | POA: Diagnosis present

## 2017-08-23 DIAGNOSIS — I5042 Chronic combined systolic (congestive) and diastolic (congestive) heart failure: Secondary | ICD-10-CM | POA: Diagnosis not present

## 2017-08-23 DIAGNOSIS — Z86711 Personal history of pulmonary embolism: Secondary | ICD-10-CM

## 2017-08-23 DIAGNOSIS — Z9119 Patient's noncompliance with other medical treatment and regimen: Secondary | ICD-10-CM

## 2017-08-23 DIAGNOSIS — Z885 Allergy status to narcotic agent status: Secondary | ICD-10-CM

## 2017-08-23 DIAGNOSIS — K760 Fatty (change of) liver, not elsewhere classified: Secondary | ICD-10-CM | POA: Diagnosis present

## 2017-08-23 DIAGNOSIS — I10 Essential (primary) hypertension: Secondary | ICD-10-CM | POA: Diagnosis not present

## 2017-08-23 DIAGNOSIS — K7460 Unspecified cirrhosis of liver: Secondary | ICD-10-CM | POA: Diagnosis present

## 2017-08-23 DIAGNOSIS — R188 Other ascites: Secondary | ICD-10-CM | POA: Diagnosis present

## 2017-08-23 DIAGNOSIS — Z833 Family history of diabetes mellitus: Secondary | ICD-10-CM

## 2017-08-23 DIAGNOSIS — N186 End stage renal disease: Secondary | ICD-10-CM | POA: Diagnosis present

## 2017-08-23 DIAGNOSIS — D631 Anemia in chronic kidney disease: Secondary | ICD-10-CM | POA: Diagnosis present

## 2017-08-23 DIAGNOSIS — K529 Noninfective gastroenteritis and colitis, unspecified: Secondary | ICD-10-CM | POA: Diagnosis present

## 2017-08-23 DIAGNOSIS — Z8249 Family history of ischemic heart disease and other diseases of the circulatory system: Secondary | ICD-10-CM

## 2017-08-23 DIAGNOSIS — I132 Hypertensive heart and chronic kidney disease with heart failure and with stage 5 chronic kidney disease, or end stage renal disease: Secondary | ICD-10-CM | POA: Diagnosis present

## 2017-08-23 DIAGNOSIS — Z992 Dependence on renal dialysis: Secondary | ICD-10-CM

## 2017-08-23 DIAGNOSIS — G894 Chronic pain syndrome: Secondary | ICD-10-CM | POA: Diagnosis present

## 2017-08-23 DIAGNOSIS — Z7901 Long term (current) use of anticoagulants: Secondary | ICD-10-CM

## 2017-08-23 DIAGNOSIS — Z888 Allergy status to other drugs, medicaments and biological substances status: Secondary | ICD-10-CM

## 2017-08-23 DIAGNOSIS — R140 Abdominal distension (gaseous): Secondary | ICD-10-CM | POA: Diagnosis not present

## 2017-08-23 DIAGNOSIS — K859 Acute pancreatitis without necrosis or infection, unspecified: Principal | ICD-10-CM | POA: Diagnosis present

## 2017-08-23 DIAGNOSIS — E785 Hyperlipidemia, unspecified: Secondary | ICD-10-CM | POA: Diagnosis present

## 2017-08-23 DIAGNOSIS — R079 Chest pain, unspecified: Secondary | ICD-10-CM | POA: Diagnosis not present

## 2017-08-23 DIAGNOSIS — R066 Hiccough: Secondary | ICD-10-CM | POA: Diagnosis present

## 2017-08-23 DIAGNOSIS — I429 Cardiomyopathy, unspecified: Secondary | ICD-10-CM | POA: Diagnosis not present

## 2017-08-23 DIAGNOSIS — T8612 Kidney transplant failure: Secondary | ICD-10-CM | POA: Diagnosis present

## 2017-08-23 DIAGNOSIS — K828 Other specified diseases of gallbladder: Secondary | ICD-10-CM | POA: Diagnosis present

## 2017-08-23 DIAGNOSIS — F329 Major depressive disorder, single episode, unspecified: Secondary | ICD-10-CM | POA: Diagnosis present

## 2017-08-23 DIAGNOSIS — Z79899 Other long term (current) drug therapy: Secondary | ICD-10-CM

## 2017-08-23 DIAGNOSIS — R000 Tachycardia, unspecified: Secondary | ICD-10-CM | POA: Diagnosis present

## 2017-08-23 DIAGNOSIS — I313 Pericardial effusion (noninflammatory): Secondary | ICD-10-CM | POA: Diagnosis present

## 2017-08-23 DIAGNOSIS — N281 Cyst of kidney, acquired: Secondary | ICD-10-CM | POA: Diagnosis present

## 2017-08-23 DIAGNOSIS — K219 Gastro-esophageal reflux disease without esophagitis: Secondary | ICD-10-CM | POA: Diagnosis present

## 2017-08-23 MED ORDER — DICYCLOMINE HCL 20 MG PO TABS
20.00 | ORAL_TABLET | ORAL | Status: DC
Start: 2017-08-21 — End: 2017-08-23

## 2017-08-23 MED ORDER — AMITRIPTYLINE HCL 10 MG PO TABS
10.00 | ORAL_TABLET | ORAL | Status: DC
Start: 2017-08-21 — End: 2017-08-23

## 2017-08-23 MED ORDER — WARFARIN SODIUM 1 MG PO TABS
ORAL_TABLET | ORAL | Status: DC
Start: ? — End: 2017-08-23

## 2017-08-23 MED ORDER — GENERIC EXTERNAL MEDICATION
Status: DC
Start: ? — End: 2017-08-23

## 2017-08-23 MED ORDER — HYDRALAZINE HCL 20 MG/ML IJ SOLN
10.00 | INTRAMUSCULAR | Status: DC
Start: ? — End: 2017-08-23

## 2017-08-23 MED ORDER — ACETAMINOPHEN 325 MG PO TABS
650.00 | ORAL_TABLET | ORAL | Status: DC
Start: ? — End: 2017-08-23

## 2017-08-23 MED ORDER — DIPHENHYDRAMINE HCL 50 MG/ML IJ SOLN
12.50 | INTRAMUSCULAR | Status: DC
Start: ? — End: 2017-08-23

## 2017-08-23 MED ORDER — CARVEDILOL 12.5 MG PO TABS
12.50 | ORAL_TABLET | ORAL | Status: DC
Start: 2017-08-21 — End: 2017-08-23

## 2017-08-23 MED ORDER — TRAMADOL HCL 50 MG PO TABS
50.00 | ORAL_TABLET | ORAL | Status: DC
Start: ? — End: 2017-08-23

## 2017-08-23 MED ORDER — CINACALCET HCL 30 MG PO TABS
30.00 | ORAL_TABLET | ORAL | Status: DC
Start: 2017-08-21 — End: 2017-08-23

## 2017-08-23 MED ORDER — POLYETHYLENE GLYCOL 3350 17 G PO PACK
17.00 | PACK | ORAL | Status: DC
Start: ? — End: 2017-08-23

## 2017-08-23 MED ORDER — ONDANSETRON HCL 4 MG/2ML IJ SOLN
4.00 | INTRAMUSCULAR | Status: DC
Start: ? — End: 2017-08-23

## 2017-08-23 MED ORDER — PANTOPRAZOLE SODIUM 40 MG PO TBEC
40.00 | DELAYED_RELEASE_TABLET | ORAL | Status: DC
Start: 2017-08-21 — End: 2017-08-23

## 2017-08-23 MED ORDER — GABAPENTIN 100 MG PO CAPS
100.00 | ORAL_CAPSULE | ORAL | Status: DC
Start: 2017-08-21 — End: 2017-08-23

## 2017-08-23 MED ORDER — SODIUM CHLORIDE 0.9 % IV SOLN
INTRAVENOUS | Status: DC
Start: ? — End: 2017-08-23

## 2017-08-23 MED ORDER — DOXAZOSIN MESYLATE 4 MG PO TABS
4.00 | ORAL_TABLET | ORAL | Status: DC
Start: 2017-08-21 — End: 2017-08-23

## 2017-08-23 MED ORDER — SEVELAMER CARBONATE 800 MG PO TABS
2400.00 | ORAL_TABLET | ORAL | Status: DC
Start: 2017-08-21 — End: 2017-08-23

## 2017-08-23 MED ORDER — SEVELAMER CARBONATE 800 MG PO TABS
1600.00 | ORAL_TABLET | ORAL | Status: DC
Start: ? — End: 2017-08-23

## 2017-08-23 MED ORDER — PRAVASTATIN SODIUM 40 MG PO TABS
40.00 | ORAL_TABLET | ORAL | Status: DC
Start: 2017-08-21 — End: 2017-08-23

## 2017-08-23 MED ORDER — HEPARIN SODIUM (PORCINE) 1000 UNIT/ML IJ SOLN
INTRAMUSCULAR | Status: DC
Start: 2017-08-22 — End: 2017-08-23

## 2017-08-23 MED ORDER — WARFARIN SODIUM 5 MG PO TABS
5.00 | ORAL_TABLET | ORAL | Status: DC
Start: 2017-08-21 — End: 2017-08-23

## 2017-08-23 MED ORDER — LISINOPRIL 20 MG PO TABS
40.00 | ORAL_TABLET | ORAL | Status: DC
Start: 2017-08-22 — End: 2017-08-23

## 2017-08-23 MED ORDER — ONDANSETRON HCL 4 MG PO TABS
4.00 | ORAL_TABLET | ORAL | Status: DC
Start: ? — End: 2017-08-23

## 2017-08-23 NOTE — ED Triage Notes (Signed)
Pt c/o abd bloating, pain, n/v/d x 2 days.

## 2017-08-23 NOTE — ED Notes (Signed)
Pt refused blood draw,  Nurse notified.

## 2017-08-24 ENCOUNTER — Emergency Department (HOSPITAL_COMMUNITY): Payer: Medicare Other

## 2017-08-24 ENCOUNTER — Inpatient Hospital Stay (HOSPITAL_COMMUNITY)
Admission: EM | Admit: 2017-08-24 | Discharge: 2017-09-04 | DRG: 438 | Disposition: A | Discharge status: Other Acute Inpatient Hospital | Payer: Medicare Other | Attending: Family Medicine | Admitting: Family Medicine

## 2017-08-24 DIAGNOSIS — R140 Abdominal distension (gaseous): Secondary | ICD-10-CM | POA: Diagnosis not present

## 2017-08-24 DIAGNOSIS — R1084 Generalized abdominal pain: Secondary | ICD-10-CM | POA: Diagnosis not present

## 2017-08-24 DIAGNOSIS — R188 Other ascites: Secondary | ICD-10-CM | POA: Diagnosis not present

## 2017-08-24 DIAGNOSIS — T8612 Kidney transplant failure: Secondary | ICD-10-CM | POA: Diagnosis present

## 2017-08-24 DIAGNOSIS — K7460 Unspecified cirrhosis of liver: Secondary | ICD-10-CM | POA: Diagnosis not present

## 2017-08-24 DIAGNOSIS — D696 Thrombocytopenia, unspecified: Secondary | ICD-10-CM | POA: Diagnosis not present

## 2017-08-24 DIAGNOSIS — Z7901 Long term (current) use of anticoagulants: Secondary | ICD-10-CM | POA: Diagnosis not present

## 2017-08-24 DIAGNOSIS — I5040 Unspecified combined systolic (congestive) and diastolic (congestive) heart failure: Secondary | ICD-10-CM | POA: Diagnosis not present

## 2017-08-24 DIAGNOSIS — Y839 Surgical procedure, unspecified as the cause of abnormal reaction of the patient, or of later complication, without mention of misadventure at the time of the procedure: Secondary | ICD-10-CM | POA: Diagnosis present

## 2017-08-24 DIAGNOSIS — I429 Cardiomyopathy, unspecified: Secondary | ICD-10-CM | POA: Diagnosis present

## 2017-08-24 DIAGNOSIS — I313 Pericardial effusion (noninflammatory): Secondary | ICD-10-CM | POA: Diagnosis not present

## 2017-08-24 DIAGNOSIS — K859 Acute pancreatitis without necrosis or infection, unspecified: Secondary | ICD-10-CM | POA: Diagnosis not present

## 2017-08-24 DIAGNOSIS — Z992 Dependence on renal dialysis: Secondary | ICD-10-CM | POA: Diagnosis not present

## 2017-08-24 DIAGNOSIS — Z01810 Encounter for preprocedural cardiovascular examination: Secondary | ICD-10-CM | POA: Diagnosis not present

## 2017-08-24 DIAGNOSIS — D631 Anemia in chronic kidney disease: Secondary | ICD-10-CM | POA: Diagnosis not present

## 2017-08-24 DIAGNOSIS — R1013 Epigastric pain: Secondary | ICD-10-CM | POA: Diagnosis not present

## 2017-08-24 DIAGNOSIS — R109 Unspecified abdominal pain: Secondary | ICD-10-CM

## 2017-08-24 DIAGNOSIS — F419 Anxiety disorder, unspecified: Secondary | ICD-10-CM | POA: Diagnosis present

## 2017-08-24 DIAGNOSIS — E8770 Fluid overload, unspecified: Secondary | ICD-10-CM | POA: Diagnosis not present

## 2017-08-24 DIAGNOSIS — Z888 Allergy status to other drugs, medicaments and biological substances status: Secondary | ICD-10-CM | POA: Diagnosis not present

## 2017-08-24 DIAGNOSIS — R079 Chest pain, unspecified: Secondary | ICD-10-CM | POA: Diagnosis not present

## 2017-08-24 DIAGNOSIS — R0602 Shortness of breath: Secondary | ICD-10-CM | POA: Diagnosis not present

## 2017-08-24 DIAGNOSIS — R932 Abnormal findings on diagnostic imaging of liver and biliary tract: Secondary | ICD-10-CM | POA: Diagnosis not present

## 2017-08-24 DIAGNOSIS — N2581 Secondary hyperparathyroidism of renal origin: Secondary | ICD-10-CM | POA: Diagnosis not present

## 2017-08-24 DIAGNOSIS — K829 Disease of gallbladder, unspecified: Secondary | ICD-10-CM | POA: Diagnosis not present

## 2017-08-24 DIAGNOSIS — K863 Pseudocyst of pancreas: Secondary | ICD-10-CM

## 2017-08-24 DIAGNOSIS — I5022 Chronic systolic (congestive) heart failure: Secondary | ICD-10-CM | POA: Diagnosis not present

## 2017-08-24 DIAGNOSIS — E785 Hyperlipidemia, unspecified: Secondary | ICD-10-CM | POA: Diagnosis present

## 2017-08-24 DIAGNOSIS — K861 Other chronic pancreatitis: Secondary | ICD-10-CM

## 2017-08-24 DIAGNOSIS — J9811 Atelectasis: Secondary | ICD-10-CM | POA: Diagnosis present

## 2017-08-24 DIAGNOSIS — G894 Chronic pain syndrome: Secondary | ICD-10-CM | POA: Diagnosis present

## 2017-08-24 DIAGNOSIS — Z86718 Personal history of other venous thrombosis and embolism: Secondary | ICD-10-CM | POA: Diagnosis not present

## 2017-08-24 DIAGNOSIS — I340 Nonrheumatic mitral (valve) insufficiency: Secondary | ICD-10-CM | POA: Diagnosis not present

## 2017-08-24 DIAGNOSIS — I5042 Chronic combined systolic (congestive) and diastolic (congestive) heart failure: Secondary | ICD-10-CM | POA: Diagnosis present

## 2017-08-24 DIAGNOSIS — G4733 Obstructive sleep apnea (adult) (pediatric): Secondary | ICD-10-CM | POA: Diagnosis present

## 2017-08-24 DIAGNOSIS — K219 Gastro-esophageal reflux disease without esophagitis: Secondary | ICD-10-CM | POA: Diagnosis present

## 2017-08-24 DIAGNOSIS — I132 Hypertensive heart and chronic kidney disease with heart failure and with stage 5 chronic kidney disease, or end stage renal disease: Secondary | ICD-10-CM | POA: Diagnosis present

## 2017-08-24 DIAGNOSIS — N186 End stage renal disease: Secondary | ICD-10-CM | POA: Diagnosis not present

## 2017-08-24 DIAGNOSIS — I10 Essential (primary) hypertension: Secondary | ICD-10-CM | POA: Diagnosis not present

## 2017-08-24 DIAGNOSIS — E1122 Type 2 diabetes mellitus with diabetic chronic kidney disease: Secondary | ICD-10-CM | POA: Diagnosis present

## 2017-08-24 DIAGNOSIS — R112 Nausea with vomiting, unspecified: Secondary | ICD-10-CM | POA: Diagnosis not present

## 2017-08-24 DIAGNOSIS — I120 Hypertensive chronic kidney disease with stage 5 chronic kidney disease or end stage renal disease: Secondary | ICD-10-CM | POA: Diagnosis not present

## 2017-08-24 DIAGNOSIS — D649 Anemia, unspecified: Secondary | ICD-10-CM | POA: Diagnosis not present

## 2017-08-24 DIAGNOSIS — R918 Other nonspecific abnormal finding of lung field: Secondary | ICD-10-CM | POA: Diagnosis not present

## 2017-08-24 DIAGNOSIS — Z885 Allergy status to narcotic agent status: Secondary | ICD-10-CM | POA: Diagnosis not present

## 2017-08-24 DIAGNOSIS — F329 Major depressive disorder, single episode, unspecified: Secondary | ICD-10-CM | POA: Diagnosis present

## 2017-08-24 LAB — CBC
HEMATOCRIT: 33.1 % — AB (ref 39.0–52.0)
HEMOGLOBIN: 10.7 g/dL — AB (ref 13.0–17.0)
MCH: 29.3 pg (ref 26.0–34.0)
MCHC: 32.3 g/dL (ref 30.0–36.0)
MCV: 90.7 fL (ref 78.0–100.0)
PLATELETS: 142 10*3/uL — AB (ref 150–400)
RBC: 3.65 MIL/uL — AB (ref 4.22–5.81)
RDW: 20.2 % — ABNORMAL HIGH (ref 11.5–15.5)
WBC: 5.1 10*3/uL (ref 4.0–10.5)

## 2017-08-24 LAB — PROTIME-INR
INR: 1.29
Prothrombin Time: 15.9 seconds — ABNORMAL HIGH (ref 11.4–15.2)

## 2017-08-24 LAB — COMPREHENSIVE METABOLIC PANEL
ALT: 18 U/L (ref 17–63)
AST: 29 U/L (ref 15–41)
Albumin: 3 g/dL — ABNORMAL LOW (ref 3.5–5.0)
Alkaline Phosphatase: 124 U/L (ref 38–126)
Anion gap: 17 — ABNORMAL HIGH (ref 5–15)
BUN: 75 mg/dL — ABNORMAL HIGH (ref 6–20)
CHLORIDE: 101 mmol/L (ref 101–111)
CO2: 22 mmol/L (ref 22–32)
CREATININE: 9.83 mg/dL — AB (ref 0.61–1.24)
Calcium: 8.8 mg/dL — ABNORMAL LOW (ref 8.9–10.3)
GFR, EST AFRICAN AMERICAN: 6 mL/min — AB (ref >60)
GFR, EST NON AFRICAN AMERICAN: 5 mL/min — AB (ref >60)
Glucose, Bld: 101 mg/dL — ABNORMAL HIGH (ref 65–99)
POTASSIUM: 5 mmol/L (ref 3.5–5.1)
SODIUM: 140 mmol/L (ref 135–145)
Total Bilirubin: 0.9 mg/dL (ref 0.3–1.2)
Total Protein: 7.3 g/dL (ref 6.5–8.1)

## 2017-08-24 LAB — LIPASE, BLOOD: LIPASE: 88 U/L — AB (ref 11–51)

## 2017-08-24 MED ORDER — SODIUM CHLORIDE 0.9 % IV SOLN
INTRAVENOUS | Status: AC
  Administered 2017-08-24: 12:00:00 via INTRAVENOUS

## 2017-08-24 MED ORDER — ONDANSETRON HCL 4 MG/2ML IJ SOLN
4.0000 mg | Freq: Four times a day (QID) | INTRAMUSCULAR | Status: DC | PRN
  Administered 2017-08-24 – 2017-08-25 (×4): 4 mg via INTRAVENOUS
  Filled 2017-08-24 (×4): qty 2

## 2017-08-24 MED ORDER — LABETALOL HCL 5 MG/ML IV SOLN
10.0000 mg | INTRAVENOUS | Status: DC | PRN
  Administered 2017-08-24 – 2017-08-25 (×4): 10 mg via INTRAVENOUS
  Filled 2017-08-24 (×7): qty 4

## 2017-08-24 MED ORDER — WARFARIN SODIUM 7.5 MG PO TABS
7.5000 mg | ORAL_TABLET | Freq: Once | ORAL | Status: AC
  Administered 2017-08-24: 7.5 mg via ORAL
  Filled 2017-08-24: qty 1

## 2017-08-24 MED ORDER — CLONIDINE HCL 0.3 MG/24HR TD PTWK: 0.3000 mg | MEDICATED_PATCH | TRANSDERMAL | Status: DC

## 2017-08-24 MED ORDER — HYDROMORPHONE HCL 1 MG/ML IJ SOLN
1.0000 mg | INTRAMUSCULAR | Status: DC | PRN
  Administered 2017-08-24 – 2017-08-25 (×6): 1 mg via INTRAVENOUS
  Filled 2017-08-24 (×6): qty 1

## 2017-08-24 MED ORDER — ONDANSETRON HCL 4 MG PO TABS: 4.0000 mg | ORAL_TABLET | Freq: Four times a day (QID) | ORAL | Status: DC | PRN

## 2017-08-24 MED ORDER — WARFARIN - PHARMACIST DOSING INPATIENT
Freq: Every day | Status: DC
  Administered 2017-08-24: 19:00:00

## 2017-08-24 MED ORDER — CLONIDINE HCL 0.3 MG/24HR TD PTWK
0.3000 mg | MEDICATED_PATCH | TRANSDERMAL | Status: DC
  Administered 2017-08-24: 0.3 mg via TRANSDERMAL
  Filled 2017-08-24: qty 1

## 2017-08-24 MED ORDER — FENTANYL CITRATE (PF) 100 MCG/2ML IJ SOLN
50.0000 ug | INTRAMUSCULAR | Status: DC | PRN
  Administered 2017-08-24: 50 ug via INTRAVENOUS
  Filled 2017-08-24: qty 2

## 2017-08-24 MED ORDER — OXYCODONE HCL ER 10 MG PO T12A: 10.0000 mg | EXTENDED_RELEASE_TABLET | Freq: Two times a day (BID) | ORAL | Status: DC

## 2017-08-24 MED ORDER — ONDANSETRON 4 MG PO TBDP
4.0000 mg | ORAL_TABLET | Freq: Once | ORAL | Status: AC
  Administered 2017-08-24: 4 mg via ORAL
  Filled 2017-08-24: qty 1

## 2017-08-24 MED ORDER — CLONIDINE HCL 0.3 MG/24HR TD PTWK
0.3000 mg | MEDICATED_PATCH | TRANSDERMAL | Status: DC
Start: 2017-08-27 — End: 2017-08-24

## 2017-08-24 MED ORDER — ONDANSETRON HCL 4 MG/2ML IJ SOLN
4.0000 mg | Freq: Once | INTRAMUSCULAR | Status: AC
  Administered 2017-08-24: 4 mg via INTRAVENOUS
  Filled 2017-08-24: qty 2

## 2017-08-24 MED ORDER — FENTANYL CITRATE (PF) 100 MCG/2ML IJ SOLN
50.0000 ug | Freq: Once | INTRAMUSCULAR | Status: AC
  Administered 2017-08-24: 50 ug via INTRAVENOUS
  Filled 2017-08-24: qty 2

## 2017-08-24 NOTE — ED Notes (Signed)
Pt states he does not make urine.

## 2017-08-24 NOTE — ED Notes (Signed)
Pt refusing blood work,  Nurse aware.

## 2017-08-24 NOTE — Progress Notes (Signed)
Patient BP still elevated despite meds given, called MD [place] Silos said to push extra dose of labetalol and they would fix order for clonidine patch to be given now.Will continue to monitor.
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
## 2017-08-24 NOTE — ED Notes (Signed)
Pt refusing vital signs at this time.

## 2017-08-24 NOTE — Progress Notes (Addendum)
Patient arrived to 6n29, alert and oriented, mild pain he said the other nurse just gave him something so its going down. IV fluids started, VSS BP high but pt stated they just gave him pain meds and BP meds so we will recheck. Oriented to room and staff, will continue to monitor.

## 2017-08-24 NOTE — ED Notes (Addendum)
Pt refusing all vital signs and EKG again. Will try after pain medication given.
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
## 2017-08-24 NOTE — Progress Notes (Signed)
BP still elevated, called MD Grand[place] Silos to let her know extra dose of labetalol was not effective and that the order for clonidine patch was not fixed, she said she would adjust it and she what it is after clonidine patch applied.
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
## 2017-08-24 NOTE — ED Notes (Signed)
Pt uncooperative at this time. Will not keep b/p cuff on or lay in bed. Refused EKG up front for tech.

## 2017-08-24 NOTE — ED Notes (Signed)
IV team paged for ETA. Returned call and will be down to the ED as soon as she can.

## 2017-08-24 NOTE — ED Notes (Addendum)
Attempted IV x 3. All 3 attempts blown.  Pt wants me to attempt another time. Told pt that we need to wait for the IV team.

## 2017-08-24 NOTE — ED Notes (Addendum)
Pt aware that MD wants an EKG. Told pt we can give an oxycodone for pain but pt refusing. States that he wants Fentanyl.  Pt wants to wait until the IV team arrives and he gets his pain medication IV and then he states he will do the EKG and chest xray. Pt refusing vital signs also.

## 2017-08-24 NOTE — ED Notes (Signed)
Pt refusing vital signs at this time.    

## 2017-08-24 NOTE — ED Notes (Signed)
Pt states that he does not make urine.

## 2017-08-24 NOTE — ED Notes (Signed)
Attempted report x1

## 2017-08-25 ENCOUNTER — Inpatient Hospital Stay (HOSPITAL_COMMUNITY): Payer: Medicare Other

## 2017-08-25 ENCOUNTER — Inpatient Hospital Stay: Payer: Medicare Other | Admitting: Internal Medicine

## 2017-08-25 DIAGNOSIS — R140 Abdominal distension (gaseous): Secondary | ICD-10-CM

## 2017-08-25 DIAGNOSIS — K863 Pseudocyst of pancreas: Secondary | ICD-10-CM

## 2017-08-25 DIAGNOSIS — R109 Unspecified abdominal pain: Secondary | ICD-10-CM

## 2017-08-25 LAB — BASIC METABOLIC PANEL
Anion gap: 24 — ABNORMAL HIGH (ref 5–15)
BUN: 107 mg/dL — ABNORMAL HIGH (ref 6–20)
CALCIUM: 8.5 mg/dL — AB (ref 8.9–10.3)
CO2: 14 mmol/L — AB (ref 22–32)
CREATININE: 12.03 mg/dL — AB (ref 0.61–1.24)
Chloride: 100 mmol/L — ABNORMAL LOW (ref 101–111)
GFR calc non Af Amer: 4 mL/min — ABNORMAL LOW (ref >60)
GFR, EST AFRICAN AMERICAN: 5 mL/min — AB (ref >60)
GLUCOSE: 35 mg/dL — AB (ref 65–99)
Potassium: 7 mmol/L (ref 3.5–5.1)
Sodium: 138 mmol/L (ref 135–145)

## 2017-08-25 LAB — GLUCOSE, CAPILLARY
Glucose-Capillary: 53 mg/dL — ABNORMAL LOW (ref 65–99)
Glucose-Capillary: 55 mg/dL — ABNORMAL LOW (ref 65–99)

## 2017-08-25 LAB — CBC
HCT: 33.5 % — ABNORMAL LOW (ref 39.0–52.0)
Hemoglobin: 10.9 g/dL — ABNORMAL LOW (ref 13.0–17.0)
MCH: 29 pg (ref 26.0–34.0)
MCHC: 32.5 g/dL (ref 30.0–36.0)
MCV: 89.1 fL (ref 78.0–100.0)
PLATELETS: 126 10*3/uL — AB (ref 150–400)
RBC: 3.76 MIL/uL — ABNORMAL LOW (ref 4.22–5.81)
RDW: 19.3 % — ABNORMAL HIGH (ref 11.5–15.5)
WBC: 8.5 10*3/uL (ref 4.0–10.5)

## 2017-08-25 MED ORDER — SODIUM CHLORIDE 0.9 % IV SOLN: 100.0000 mL | INTRAVENOUS | Status: DC | PRN

## 2017-08-25 MED ORDER — LIDOCAINE-PRILOCAINE 2.5-2.5 % EX CREA
1.0000 "application " | TOPICAL_CREAM | CUTANEOUS | Status: DC | PRN
  Filled 2017-08-25: qty 5

## 2017-08-25 MED ORDER — DEXTROSE 50 % IV SOLN
50.0000 mL | Freq: Once | INTRAVENOUS | Status: AC
  Administered 2017-08-25: 50 mL via INTRAVENOUS
  Filled 2017-08-25: qty 50

## 2017-08-25 MED ORDER — PENTAFLUOROPROP-TETRAFLUOROETH EX AERO: 1.0000 "application " | INHALATION_SPRAY | CUTANEOUS | Status: DC | PRN

## 2017-08-25 MED ORDER — ALTEPLASE 2 MG IJ SOLR: 2.0000 mg | Freq: Once | INTRAMUSCULAR | Status: DC | PRN

## 2017-08-25 MED ORDER — IOPAMIDOL (ISOVUE-300) INJECTION 61%: 15.0000 mL | INTRAVENOUS | Status: AC

## 2017-08-25 MED ORDER — HYDROXYZINE HCL 25 MG PO TABS
25.0000 mg | ORAL_TABLET | Freq: Three times a day (TID) | ORAL | Status: DC | PRN
  Administered 2017-08-25 – 2017-09-01 (×5): 25 mg via ORAL
  Filled 2017-08-25 (×5): qty 1

## 2017-08-25 MED ORDER — WARFARIN SODIUM 7.5 MG PO TABS
7.5000 mg | ORAL_TABLET | Freq: Once | ORAL | Status: AC
  Administered 2017-08-25: 7.5 mg via ORAL
  Filled 2017-08-25: qty 1

## 2017-08-25 MED ORDER — LIDOCAINE HCL (PF) 1 % IJ SOLN: 5.0000 mL | INTRAMUSCULAR | Status: DC | PRN

## 2017-08-25 MED ORDER — FENTANYL CITRATE (PF) 100 MCG/2ML IJ SOLN
INTRAMUSCULAR | Status: AC
  Administered 2017-08-26: 50 ug via INTRAVENOUS
  Filled 2017-08-25: qty 2

## 2017-08-25 MED ORDER — HYDROXYZINE HCL 25 MG PO TABS
ORAL_TABLET | ORAL | Status: AC
  Filled 2017-08-25: qty 1

## 2017-08-25 MED ORDER — HEPARIN SODIUM (PORCINE) 1000 UNIT/ML DIALYSIS
1000.0000 [IU] | INTRAMUSCULAR | Status: DC | PRN
  Filled 2017-08-25: qty 1

## 2017-08-25 MED ORDER — FENTANYL CITRATE (PF) 100 MCG/2ML IJ SOLN
50.0000 ug | INTRAMUSCULAR | Status: DC | PRN
  Administered 2017-08-25 – 2017-08-26 (×4): 50 ug via INTRAVENOUS
  Filled 2017-08-25 (×4): qty 2

## 2017-08-25 MED ORDER — AMMONIUM LACTATE 12 % EX LOTN
TOPICAL_LOTION | CUTANEOUS | Status: DC | PRN
  Filled 2017-08-25: qty 400

## 2017-08-25 NOTE — Discharge Summary (Signed)
[facility] Discharge Summary  Patient name: Frank [name] Medical record number: [id number] Date of birth: 1966/01/06 Age: 52 y.o. Gender: male Date of Admission: 08/24/2017  Date of Discharge: 09/04/2017 Admitting Physician: Lind Covert, MD  Primary Care Provider: No primary care provider on file. Consultants: Gastroenterology, Surgery, Interventional Radiology  Indication for Hospitalization:   Acute on Chronic Pancreatits Hemodialysis  Discharge Diagnoses/Problem List:   Acute on Chronic Pancreatits ESRD with HD GERD HTN HFrEF Anxiety Depression Anemia of Chronic Disease  Disposition: Transfer to [facility]  Discharge Condition: Stable  Discharge Exam:  Gen: Alert and Oriented x 3, NAD HEENT: Normocephalic, atraumatic, PERRLA, EOMI CV: RRR, no murmurs, normal S1, S2 split, +2 pulses dorsalis pedis bilaterally Resp: CTAB, no wheezing, rales, or rhonchi, comfortable work of breathing Abd: non-distended, non-tender, soft, +bs in all four quadrants, no hepatomegaly MSK: FROM in all four extremities Ext: no clubbing, cyanosis, or edema Skin: warm, dry, intact, no rashes  Brief Hospital Course:  [name] is a 52y/o male with a PMH of ESRDon MWF HD, HTN, HFrEF, NICM,DVT/PEon Coumadin, anxiety and depression, chronic pain syndrome, anemia of chronic disease, GERD, chronic pancreatitis, HLD, OSA who presented to the ED for 2 days of abdominal pain, nausea, vomiting, and lack of appetite. He was admitted and found to have an elevated lipase of 88, his abdominal x-ray showed air-fluid gastric levels consistent with small bowel enteritis. He had multiple episodes of vomiting overnight with worsening abdominal distention and firmness which prompted obtaining an abdominal CT w/o contrast. The CT abd showed enlarging pericardial effusion with dependent atelectasis in the right lung, possible ascites with focal collection in the  lesser sac, question of liver cirrhosis, and no acute bowel pathology.   An Abd U/S was performed that showed gallbladder wall thickening of 17m with no gallstones. Surgery was consulted and had a HIDA scan that showed normal EF of gallbladder at 44%. Surgery recommended no intervention at this time.  The Abd U/S also demonstrated an enlarging pancreatic pseudocyst and GI and IR was consulted. GI agreed with IR that his fluid was due to the pseudocyst and [facility] would [facility].   An ECHO was performed due to the pericardial effusion seen on that showed a worsening EF of 20-25%, global hypokinesis, enlarged left atria, and mild mitral regurgitation; Grade 3 diastolic dysfunction. This was a significant decrease in LVEF from his previous ECHO in 10/31/2015 that showed EF of 40-45% global hypokinesis, severe LAE. Cardiology was consulted and evaluated the patient and gave cardiac clearance for procedure to drain pancreatic pseudocyst.  His pain remained high during his admission and he was maintained on Dilaudid 133mq4hrs.  He was seen by Nephrology and his HD was performed on the MWF schedule, same as his outpatient schedule.  Issues for Follow Up:  1. Pancreatic Cyst: 2. Pain control  Significant Procedures:  None  Significant Labs and Imaging:  Recent Labs  Lab 09/03/17 0615 09/03/17 1000 09/04/17 0714  WBC 3.7* 4.1 4.2  HGB 10.3* 10.5* 10.2*  HCT 31.8* 31.9* 31.1*  PLT 141* 143* 127*   Recent Labs  Lab 08/31/17 1011 09/01/17 1142 09/02/17 1030 09/02/17 1702 09/03/17 1000 09/04/17 0714  NA 137 137 135  --  135 136  K 5.1 4.4 6.2*  --  4.7 4.1  CL 99* 98* 100*  --  97* 99*  CO2 21* 23 22  --  22 25  GLUCOSE 95 83 86  --  105* 117*  BUN 36* 58* 37*  --  53* 33*  CREATININE 6.37* 8.97* 6.94*  --  8.97* 6.78*  CALCIUM 8.9 8.9 8.6*  --  8.5* 8.5*  PHOS  --   --    --  6.3* 6.7* 5.3*  ALBUMIN  --   --   --   --  2.8* 2.6*   Result Date: 08/25/2017 CLINICAL DATA:  Abdominal distension. Worsening abdominal pain over the last 3 days. EXAM: CT ABDOMEN AND PELVIS WITHOUT CONTRAST TECHNIQUE: Multidetector CT imaging of the abdomen and pelvis was performed following the standard protocol without IV contrast. COMPARISON:  CT 08/08/2017 FINDINGS: Lower chest: Pericardial effusion has gotten larger. There is now a pleural effusion on the right with dependent pulmonary atelectasis. Mild atelectasis again seen in the lingula. Hepatobiliary: Increasing prominence of the left lobe of the liver raises the possibility of developing cirrhosis. No sign of focal hepatic lesion on this noncontrast study. No calcified gallstones. Pancreas: Atrophic changes of the pancreas.  No mass. Spleen: Normal Adrenals/Urinary Tract: Adrenal glands are normal. Chronic renal atrophy with a chronic cyst of the left kidney. Atrophic transplant kidney in the left iliac fossa. Small amount of fluid in the bladder. Stomach/Bowel: No sign of bowel obstruction by CT. Vascular/Lymphatic: Widespread atherosclerosis.  No aneurysm. Reproductive: Normal Other: Ascites, ascites, freely distributed but with focal accumulation in the lesser sac as seen previously. Overall ir is probably slightly more fluid. I do not see evidence of an abscess. No free intraperitoneal air. Musculoskeletal: Negative IMPRESSION: Enlarging pericardial effusion. Newly seen right pleural effusion with dependent atelectasis in the right lung. Redemonstration of ascites with focal collection in the lesser sac, similar in size to the previous study. Slightly larger volume of ascites elsewhere within the abdomen. Question cirrhosis of the liver. Chronic renal atrophy. Chronic atrophy of the left iliac fossa transplant kidney. No acute bowel pathology identifiable. Electronically Signed   By: [name] M.D.   On: 08/25/2017 22:51   Dg Abd 1  View  Result Date: 08/24/2017 CLINICAL DATA:  Abdominal pain, evaluate for free air EXAM: ABDOMEN - 1 VIEW COMPARISON:  CT abdomen/pelvis dated 08/08/2017 FINDINGS: Nonobstructive bowel gas pattern. Multiple air-fluid levels on this single upright abdominal radiograph, raising the possibility of small bowel enteritis. No evidence of free air under the diaphragm on the upright view. IMPRESSION: No evidence of small bowel obstruction or free air. Multiple air-fluid levels, raising the possibility of small bowel enteritis. Electronically Signed   By: Julian Hy M.D.   On: 08/24/2017 09:08   Dg Chest Port 1 View  Result Date: 08/24/2017 CLINICAL DATA:  Chest pain, shortness of breath EXAM: PORTABLE CHEST 1 VIEW COMPARISON:  08/08/2017 FINDINGS: Cardiomegaly with pulmonary vascular congestion. Very mild interstitial edema is possible, although this is improved from the prior. Suspected trace bilateral pleural effusions. No pneumothorax. IMPRESSION: Cardiomegaly with possible mild interstitial edema, although improved from the prior. Trace bilateral pleural effusions. Electronically Signed   By: Julian Hy M.D.   On: 08/24/2017 09:07   Dg Abd 2 Views  Result Date: 08/25/2017 CLINICAL DATA:  Abdominal distension and pain over the last 3 days. EXAM: ABDOMEN - 2 VIEW COMPARISON:  CT same day. FINDINGS: Oral contrast is present within the bowel. There are some small air-fluid levels but no evidence of abnormally dilated loops of significance. No free air.  Extensive vascular calcification, including within a transplant kidney in the left iliac fossa. No acute bone finding. IMPRESSION: Oral contrast present for CT. No suspicion of ileus, obstruction or free air. Electronically Signed   By: [name] M.D.   On: 08/25/2017 22:54    Results/Tests Pending at Time of Discharge: None  Discharge Medications:  Allergies as of 09/04/2017      Reactions   Butalbital-apap-caffeine Shortness Of Breath,  Swelling, Other (See Comments)   Swelling in throat   Ferrlecit [na Ferric Gluc Cplx In Sucrose] Shortness Of Breath, Swelling, Other (See Comments)   Swelling in throat, tolerates Venofor   Minoxidil Shortness Of Breath   Tylenol [acetaminophen] Anaphylaxis, Swelling   Darvocet [propoxyphene N-acetaminophen] Hives      Medication List    STOP taking these medications   cloNIDine 0.3 mg/24hr patch Commonly known as:  CATAPRES - Dosed in mg/24 hr     TAKE these medications   amitriptyline 10 MG tablet Commonly known as:  ELAVIL Take 1 tablet (10 mg total) by mouth at bedtime.   amLODipine 10 MG tablet Commonly known as:  NORVASC Take 1 tablet (10 mg total) by mouth at bedtime.   ammonium lactate 12 % lotion Commonly known as:  LAC-HYDRIN Apply topically as needed for dry skin.   atorvastatin 10 MG tablet Commonly known as:  LIPITOR Take 1 tablet (10 mg total) by mouth daily at 6 PM.   carvedilol 25 MG tablet Commonly known as:  COREG Take 1 tablet (25 mg total) by mouth 2 (two) times daily with a meal.   cinacalcet 30 MG tablet Commonly known as:  SENSIPAR Take 3 tablets (90 mg total) by mouth daily with supper.   Darbepoetin Alfa 60 MCG/0.3ML Sosy injection Commonly known as:  ARANESP Inject 0.3 mLs (60 mcg total) into the vein every Wednesday with hemodialysis. Start taking on:  09/10/2017   dicyclomine 20 MG tablet Commonly known as:  BENTYL Take 20 mg by mouth every 6 (six) hours.   docusate sodium 100 MG capsule Commonly known as:  COLACE Take 1 capsule (100 mg total) by mouth 2 (two) times daily.   doxazosin 4 MG tablet Commonly known as:  CARDURA Take 4 mg by mouth at bedtime.   feeding supplement (PRO-STAT SUGAR FREE 64) Liqd Take 30 mLs by mouth 2 (two) times daily.   gabapentin 100 MG capsule Commonly known as:  NEURONTIN Take 100 mg by mouth daily.   hydrALAZINE 100 MG tablet Commonly known as:  APRESOLINE Take 100 mg by mouth daily.    isosorbide mononitrate 60 MG 24 hr tablet Commonly known as:  IMDUR Take 1 tablet (60 mg total) by mouth daily.   metoCLOPramide 5 MG tablet Commonly known as:  REGLAN Take 1 tablet (5 mg total) by mouth every 8 (eight) hours as needed for nausea or vomiting.   multivitamin Tabs tablet Take 1 tablet by mouth at bedtime.   ondansetron 4 MG disintegrating tablet Commonly known as:  ZOFRAN-ODT Take 1 tablet (4 mg total) by mouth every 8 (eight) hours as needed for nausea. 25m ODT q4 hours prn nausea   pantoprazole 40 MG tablet Commonly known as:  PROTONIX Take 1 tablet (40 mg total) by mouth 2 (two) times daily before a meal.   polyethylene glycol packet Commonly known as:  MIRALAX / GLYCOLAX Take 17 g by mouth daily as needed for mild constipation.   Respiratory Therapy Supplies Misc 1 each daily. CPAP MACHINE  sevelamer carbonate 800 MG tablet Commonly known as:  RENVELA Take 3 tablets (2,400 mg total) by mouth 3 (three) times daily with meals.   warfarin 7.5 MG tablet Commonly known as:  COUMADIN Take 1 tablet (7.5 mg total) by mouth daily. Today and tomorrow. See coumadin pharmacist for further management.       Discharge Instructions: Please refer to Patient Instructions section of EMR for full details.  Patient was counseled important signs and symptoms that should prompt return to medical care, changes in medications, dietary instructions, activity restrictions, and follow up appointments.   Follow-Up Appointments:   Bonnita Hollow, MD 09/04/2017, 7:01 PM PGY-1, [place]

## 2017-08-25 NOTE — Progress Notes (Signed)
[name]: [phone]  Patient name: Frank [name] Medical record number: [id number] Date of birth: 1966-01-28 Age: 52 y.o. Gender: male  Primary Care Provider: Lolita Patella, MD Consultants: Nephrology Code Status: Full  Pt Overview and Major Events to Date:  Frank [name] is a 52 y.o. male presenting with abdominal pain thought to be secondary to acute on chronic pancreatitis. PMH is significant for PMH is significant forESRD on MWF HD, HTN, HFrEF, NICM,DVT/PE on Coumadin, anxiety and depression, chronic pain syndrome, anemia of chronic disease, GERD, chronic pancreatitis, HLD, OSA.  Assessment and Plan:  Abdominal Pain: Acute on Chronic. Possible acute on chronic pancreatitis. Lipase is mildly elevated. Patient has history of multiple admissions due to epigastric pain and multiple episodes of pancreatitis. His presenting symptoms of abdominal pain, nausea, vomiting, with elevated lipase are consistent with his previous episodes. Abdominal x-ray showed no evidence of small bowel obstruction or free air but did show air gastric levels consistent with enteritis. His presentation is different with distention and his abdomen is firm on exam. Could also be due to obstruction. Possible his pancreatic cyst is also growing - Zofran prn for nausea - Keep NPO until CT scan. Advance diet as tolerated if scan is negative for obstruction - Continue IV fluids 50cc until regular oral intake - D/c Diladid today and restart Fentanyl 40mg q4 prn for pain; consider beginning to wean off IV pain medications today and transition to oral when po intake improves - CT Abd w/o contrast to rule out obstruction - If CT Abd is negative consider consult to IR for drainage of pancreatic cyst  Shortness of Breath: Acute. Stable. Patient presented with symptoms of SOB and no documented COPD or Asthma. He takes no medications at home for any respiratory issues. CXR  showed some mild interstial edema and pulmonary vascular congestion, and trace bilateral effusions. Overall, his CXR was improved from previous. Likely caused by fluid overload needing HD. Does have Cardiomegaly. - monitor for symptoms - continue to monitor O2 sats  ESRD on MWF HD: [facility] Has not missed any HD sessions recently per patient. K 5.0 and patient does not appear volume overloaded - Holding home Renvela 2,4091mtid until able to take PO - Nephro consult on Monday for HD  HTN: BP now within goal at 140/82 this am. Likely elevated due to pain and not being able to keep down BP meds at home. - Cont to hold home Norvasc 1037maily, Coreg 90m49md, Doxazosin 4mg 39m,  - Hydralazine 100mg 45my while unable to keep down PO - Continue Clonidine patch prn - Labetalol IV 10mg p46mor SBP > 160 or DBP > 100 - Can add IV Hydralazine if needed - Likely need to switch home Hydralazine to tid dosing on discharge home  HFrEF / NICM: ECHO 10/31/15 with EF 35-40%, global hypokinesis, severe LAE, moderate RAI, moderate TR. Does not appear volume overloaded. - Holding Coreg 90mg bi74md Imdur 60mg [id number] able to take PO - Fluid status management with HD  Hx DVT/PE: Has had multiple DVTs in the past. On Coumadin at home. - Coumadin per pharm  Anxiety/depression: Chronic, stable. Denies any feelings of depression on admission. - Holding home Amitriptyline while NPO  Chronic Pain Syndrome: - Restart home Amitriptyline 10mg and 39mpentin 100mg qd wh38mble to take PO  Anemia of Chronic Disease: Likely related to CKD. Hgb 10.7 on admission (baseline ~10) -  Trend Hgb  HLD: Last lipid panel 08/09/17 with Chol 92, HDL 24, LDL 53, and TG 77. - Restart home Lipitor 16m daily when able to take PO  OSA: Chronic. Stable. - CPAP qhs  GERD: Chronic. Stable. - Restart home Protonix 456mbid until able to take PO  FEN/GI: Advance diet as  tolerated PPx: Coumadin for DVT/PD  Disposition: Discharge home in next two days  Subjective:  Today patient states he feels somewhat better but had 7 episodes of vomiting last night. Vomiting was NBNB. His abdominal pain is somewhat tolerable but still hurts. He is also complaining of having hiccups. He states his skin is very dry but otherwise he is doing okay. He is having bowel movement that are normal consistency but the amount has been less than normal.  Objective: Temp:  [97.6 F (36.4 C)-98.1 F (36.7 C)] 98.1 F (36.7 C) (02/03 1418) Pulse Rate:  [62-98] 62 (02/04 0521) Resp:  [18] 18 (02/04 0521) BP: (140-190)/(82-128) 140/82 (02/04 0521) SpO2:  [94 %-100 %] 96 % (02/04 0521) Weight:  [194 lb 0.1 oz (88 kg)] 194 lb 0.1 oz (88 kg) (02/03 1219) Physical Exam: Gen: Alert and Oriented x 3, NAD HEENT: Normocephalic, atraumatic, PERRLA, EOMI CV: RRR, no murmurs, normal S1, S2 split, +2 pulses dorsalis pedis bilaterally Resp: CTAB, no wheezing, rales, or rhonchi, comfortable work of breathing Abd: mild distention, mildly tender to palpation in all quadrants, firm, +bs in all four quadrants Ext: no clubbing, cyanosis, or edema Skin: warm, dry, intact, extremely dry and scaly skin  Laboratory: Recent Labs  Lab 08/24/17 0042  WBC 5.1  HGB 10.7*  HCT 33.1*  PLT 142*   Recent Labs  Lab 08/24/17 0042  NA 140  K 5.0  CL 101  CO2 22  BUN 75*  CREATININE 9.83*  CALCIUM 8.8*  PROT 7.3  BILITOT 0.9  ALKPHOS 124  ALT 18  AST 29  GLUCOSE 101*   2/2 - U/A: Pending 2/3 - Lipase: 88 2/3 - Protime/INR: 15.9/16.8  Imaging/Diagnostic Tests:  2/3 - CXR:  FINDINGS: Cardiomegaly with pulmonary vascular congestion. Very mild interstitial edema is possible, although this is improved from the prior. Suspected trace bilateral pleural effusions. No pneumothorax.  IMPRESSION: Cardiomegaly with possible mild interstitial edema, although improved from the prior.  Trace  bilateral pleural effusions.  2/3 - Abd X-ray: FINDINGS: Nonobstructive bowel gas pattern.  Multiple air-fluid levels on this single upright abdominal radiograph, raising the possibility of small bowel enteritis.  No evidence of free air under the diaphragm on the upright view.  IMPRESSION: No evidence of small bowel obstruction or free air.  Multiple air-fluid levels, raising the possibility of small bowel enteritis.  LoNuala AlphaDO 08/25/2017, 7:13 AM PGY-1, [place] pager: [phone] pages welcome

## 2017-08-25 NOTE — Progress Notes (Signed)
1430 Patient taken down for dialysis.

## 2017-08-25 NOTE — Progress Notes (Signed)
ANTICOAGULATION CONSULT NOTE - Follow-Up Consult  Pharmacy Consult for Coumadin Indication: Hx of DVTs  Allergies  Allergen Reactions  . Butalbital-Apap-Caffeine Shortness Of Breath, Swelling and Other (See Comments)    Swelling in throat  . Ferrlecit [Na Ferric Gluc Cplx In Sucrose] Shortness Of Breath, Swelling and Other (See Comments)    Swelling in throat, tolerates Venofor  . Minoxidil Shortness Of Breath  . Tylenol [Acetaminophen] Anaphylaxis and Swelling  . Darvocet [Propoxyphene N-Acetaminophen] Hives   Assessment: 52 yo M presents on 2/3 with abdominal pain. On Coumadin 23m PO daily? PTA for hx of DVTs. Verbally abusive and aggressive towards staff so unable to get clear med rec at this time. INR on admit is low at 1.29. Hgb 10.7, plts 142.  No INR drawn today - was noted to be low from 2/3 labs at 1.29. Given patient's history and prior trends - will continue with 7.5 mg dose again today.   Goal of Therapy:  INR 2-3 Monitor platelets by anticoagulation protocol: Yes   Plan:  Give Coumadin 7.530mPO x 1 tonight Monitor daily INR, CBC, s/s of bleed  MaLawson Radar/10/2017,6:07 PM

## 2017-08-25 NOTE — Progress Notes (Signed)
Patient returned to room 6N29 from hemodialysis. Alert and oriented x4. Blood pressure elevated 181/112 and CBG 55, was 53 in dialysis per RN report. Will administer D50 IV as pt is NPO and will give PRN labetalol. Will continue to monitor.

## 2017-08-25 NOTE — Progress Notes (Signed)
CRITICAL VALUE ALERT  Critical Value:  K++ 7.0  Glucose 35  Date & Time Notied:  08/25/2017 at 1600  Provider Notified: Dyane Dustman - Dialysis Nurse   Orders Received/Actions taken: Pt was in dialysis, RN was to recheck Pt's blood sugar. Nurse advised this RN that the Pt was talking to her.

## 2017-08-25 NOTE — Progress Notes (Signed)
Patient refused for blood pressure and blood sugar rechecked. Off to radiology for CT and KUB of abdomen.
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
## 2017-08-26 DIAGNOSIS — D649 Anemia, unspecified: Secondary | ICD-10-CM

## 2017-08-26 DIAGNOSIS — R188 Other ascites: Secondary | ICD-10-CM

## 2017-08-26 DIAGNOSIS — D696 Thrombocytopenia, unspecified: Secondary | ICD-10-CM

## 2017-08-26 DIAGNOSIS — Z7901 Long term (current) use of anticoagulants: Secondary | ICD-10-CM

## 2017-08-26 LAB — BASIC METABOLIC PANEL
Anion gap: 16 — ABNORMAL HIGH (ref 5–15)
BUN: 47 mg/dL — AB (ref 6–20)
CHLORIDE: 97 mmol/L — AB (ref 101–111)
CO2: 23 mmol/L (ref 22–32)
CREATININE: 7.76 mg/dL — AB (ref 0.61–1.24)
Calcium: 8.6 mg/dL — ABNORMAL LOW (ref 8.9–10.3)
GFR calc Af Amer: 8 mL/min — ABNORMAL LOW (ref >60)
GFR calc non Af Amer: 7 mL/min — ABNORMAL LOW (ref >60)
GLUCOSE: 116 mg/dL — AB (ref 65–99)
Potassium: 4.7 mmol/L (ref 3.5–5.1)
SODIUM: 136 mmol/L (ref 135–145)

## 2017-08-26 LAB — PROTIME-INR
INR: 2.12
Prothrombin Time: 23.6 seconds — ABNORMAL HIGH (ref 11.4–15.2)

## 2017-08-26 MED ORDER — AMITRIPTYLINE HCL 10 MG PO TABS
10.0000 mg | ORAL_TABLET | Freq: Every day | ORAL | Status: DC
  Administered 2017-08-26 – 2017-09-03 (×8): 10 mg via ORAL
  Filled 2017-08-26 (×9): qty 1

## 2017-08-26 MED ORDER — LABETALOL HCL 5 MG/ML IV SOLN
10.0000 mg | INTRAVENOUS | Status: DC | PRN
  Administered 2017-08-26: 10 mg via INTRAVENOUS
  Filled 2017-08-26: qty 4

## 2017-08-26 MED ORDER — HEPARIN BOLUS VIA INFUSION
4000.0000 [IU] | Freq: Once | INTRAVENOUS | Status: DC
  Filled 2017-08-26: qty 4000

## 2017-08-26 MED ORDER — NEPRO/CARBSTEADY PO LIQD
237.0000 mL | Freq: Three times a day (TID) | ORAL | Status: DC
  Administered 2017-08-26 – 2017-09-04 (×19): 237 mL via ORAL
  Filled 2017-08-26 (×46): qty 237

## 2017-08-26 MED ORDER — FENTANYL CITRATE (PF) 100 MCG/2ML IJ SOLN
25.0000 ug | INTRAMUSCULAR | Status: DC | PRN
  Administered 2017-08-26 – 2017-08-27 (×8): 25 ug via INTRAVENOUS
  Filled 2017-08-26 (×9): qty 2

## 2017-08-26 MED ORDER — ATORVASTATIN CALCIUM 10 MG PO TABS
10.0000 mg | ORAL_TABLET | Freq: Every day | ORAL | Status: DC
  Administered 2017-08-26 – 2017-09-04 (×10): 10 mg via ORAL
  Filled 2017-08-26 (×10): qty 1

## 2017-08-26 MED ORDER — AMLODIPINE BESYLATE 10 MG PO TABS
10.0000 mg | ORAL_TABLET | Freq: Every day | ORAL | Status: DC
  Administered 2017-08-26 – 2017-09-04 (×9): 10 mg via ORAL
  Filled 2017-08-26 (×9): qty 1

## 2017-08-26 MED ORDER — TRAMADOL HCL 50 MG PO TABS: 50.0000 mg | ORAL_TABLET | Freq: Four times a day (QID) | ORAL | Status: DC | PRN

## 2017-08-26 MED ORDER — DOXAZOSIN MESYLATE 4 MG PO TABS
4.0000 mg | ORAL_TABLET | Freq: Every day | ORAL | Status: DC
  Administered 2017-08-26 – 2017-09-03 (×8): 4 mg via ORAL
  Filled 2017-08-26 (×11): qty 1

## 2017-08-26 MED ORDER — GABAPENTIN 100 MG PO CAPS
100.0000 mg | ORAL_CAPSULE | Freq: Every day | ORAL | Status: DC
  Administered 2017-08-26 – 2017-09-03 (×8): 100 mg via ORAL
  Filled 2017-08-26 (×9): qty 1

## 2017-08-26 MED ORDER — PANTOPRAZOLE SODIUM 40 MG PO TBEC
40.0000 mg | DELAYED_RELEASE_TABLET | Freq: Two times a day (BID) | ORAL | Status: DC
  Administered 2017-08-26 – 2017-09-04 (×16): 40 mg via ORAL
  Filled 2017-08-26 (×16): qty 1

## 2017-08-26 MED ORDER — WARFARIN SODIUM 7.5 MG PO TABS
7.5000 mg | ORAL_TABLET | Freq: Every day | ORAL | Status: DC
  Administered 2017-08-26: 7.5 mg via ORAL
  Filled 2017-08-26 (×2): qty 1

## 2017-08-26 MED ORDER — WARFARIN - PHARMACIST DOSING INPATIENT: Freq: Every day | Status: DC

## 2017-08-26 MED ORDER — CARVEDILOL 25 MG PO TABS
25.0000 mg | ORAL_TABLET | Freq: Two times a day (BID) | ORAL | Status: DC
  Administered 2017-08-26 – 2017-09-04 (×18): 25 mg via ORAL
  Filled 2017-08-26 (×18): qty 1

## 2017-08-26 MED ORDER — HEPARIN (PORCINE) IN NACL 100-0.45 UNIT/ML-% IJ SOLN
1400.0000 [IU]/h | INTRAMUSCULAR | Status: DC
  Administered 2017-08-26: 1400 [IU]/h via INTRAVENOUS
  Filled 2017-08-26: qty 250

## 2017-08-26 MED ORDER — DOXERCALCIFEROL 4 MCG/2ML IV SOLN
8.0000 ug | INTRAVENOUS | Status: DC
  Administered 2017-08-27 – 2017-09-03 (×4): 8 ug via INTRAVENOUS
  Filled 2017-08-26 (×2): qty 4

## 2017-08-26 NOTE — Progress Notes (Signed)
ANTICOAGULATION CONSULT NOTE - Follow Up Consult  Pharmacy Consult for Heparin/warfarin Indication: h/o DVT  Allergies  Allergen Reactions  . Butalbital-Apap-Caffeine Shortness Of Breath, Swelling and Other (See Comments)    Swelling in throat  . Ferrlecit [Na Ferric Gluc Cplx In Sucrose] Shortness Of Breath, Swelling and Other (See Comments)    Swelling in throat, tolerates Venofor  . Minoxidil Shortness Of Breath  . Tylenol [Acetaminophen] Anaphylaxis and Swelling  . Darvocet [Propoxyphene N-Acetaminophen] Hives    Patient Measurements: Height: _0  (188 cm) Weight: 184 lb 11.9 oz (83.8 kg) IBW/kg (Calculated) : 82.2  Vital Signs: Temp: 97.6 F (36.4 C) (02/05 0500) Temp Source: Oral (02/05 0500) BP: 148/83 (02/05 0500) Pulse Rate: 86 (02/05 0500)  Labs: Recent Labs    08/24/17 0042 08/24/17 0453 08/25/17 1600 08/26/17 0656 08/26/17 0857  HGB 10.7*  --  10.9*  --   --   HCT 33.1*  --  33.5*  --   --   PLT 142*  --  126*  --   --   LABPROT  --  15.9*  --  23.6*  --   INR  --  1.29  --  2.12  --   CREATININE 9.83*  --  12.03*  --  7.76*    Estimated Creatinine Clearance: 12.8 mL/min (A) (by C-G formula based on SCr of 7.76 mg/dL (H)).  Assessment: 52 y.o. male with h/o DVT on warfarin at home currently on IV heparin. Pharmacy consulted to resume warfarin. INR this AM was therapeutic. H/H low stable. Plt 126k  Home warfarin dose: 7.5 mg daily (uncomfirmed with patient since he is not co-operative with medication history technician)  Goal of Therapy:  INR 2-3 Monitor platelets by anticoagulation protocol: Yes   Plan:  Resume warfarin 7.5 mg daily  Daily PT/INR    Albertina Parr, PharmD., BCPS Clinical Pharmacist Pager [phone]

## 2017-08-26 NOTE — Progress Notes (Signed)
[name]: [phone]  Patient name: Frank [name] Medical record number: [id number] Date of birth: April 24, 1966 Age: 52 y.o. Gender: male  Primary Care Provider: Lolita Patella, MD Consultants: Nephrology Code Status: Full  Pt Overview and Major Events to Date:  Frank [name] is a 52 y.o. male presenting with abdominal pain thought to be secondary to acute on chronic pancreatitis. PMH is significant for PMH is significant forESRD on MWF HD, HTN, HFrEF, NICM,DVT/PE on Coumadin, anxiety and depression, chronic pain syndrome, anemia of chronic disease, GERD, chronic pancreatitis, HLD, OSA.  Assessment and Plan:  Abdominal Pain: Acute on Chronic. Possible acute on chronic pancreatitis. Lipase is mildly elevated. Patient has history of multiple admissions due to epigastric pain and multiple episodes of pancreatitis. His presenting symptoms of abdominal pain, nausea, vomiting, with elevated lipase are consistent with his previous episodes. Abdominal x-ray showed no evidence of small bowel obstruction or free air but did show air gastric levels consistent with enteritis. CT Abd showed no acute bowel pathology. Given distention and abdominal pain with new finding of Cirrhosis concern for SBP however, he is improving since admission which is encouraging. - Consult to Gastroenterology - Zofran prn for nausea -  Advance diet as tolerated, clear liquids this am transition to soft solids - Discontinue Fentanyl 76mg q4 prn for pain - Tramadol, Tylenol  for pain prn - Consider consult to IR for drainage of pancreatic cyst  Cirrohsis: Acute finding, likely from chronic disease. Patient does not endorse history of ETOH consumption. Unclear if he had Hep C but was tested several years ago. - Obtain Hep C antibody reflex panel today - Consult to GI  Shortness of Breath: Acute. Improved. Patient presented with symptoms of SOB and no documented COPD or  Asthma. He takes no medications at home for any respiratory issues. CXR showed some mild interstial edema and pulmonary vascular congestion, and trace bilateral effusions. Overall, his CXR was improved from previous. Likely caused by fluid overload needing HD. Does have Cardiomegaly. CT Abd did show enlarging pericardial effusion with right pleural effusion and dependent atelectasis in the right lung. - Echocardiogram today - cont monitor for symptoms - continue to monitor O2 sats  ESRD on MWF HD: [facility] Has not missed any HD sessions recently per patient. K 5.0 and patient does not appear volume overloaded - Holding home Renvela 2,4081mtid until able to take PO - Nephro consult on Monday for HD  HTN: BP now within goal at 140/82 this am. Likely elevated due to pain and not being able to keep down BP meds at home. - Restart home Norvasc 1065maily, Coreg 53m84md, Doxazosin 4mg 6m,  - Discontinue Hydralazine 100mg 63my - Continue Clonidine patch prn - Likely need to switch home Hydralazine to tid dosing on discharge home  HFrEF / NICM: ECHO 10/31/15 with EF 35-40%, global hypokinesis, severe LAE, moderate RAI, moderate TR. Does not appear volume overloaded. - Restart Coreg 53mg b44mnd Imdur 60mg da92m- Fluid status management with HD  Hx DVT/PE: Has had multiple DVTs in the past. On Coumadin at home. - Coumadin per pharm  Anxiety/depression: Chronic, stable. Denies any feelings of depression on admission. - Restart home Amitriptyline while NPO  Chronic Pain Syndrome: - Restart home Amitriptyline 10mg and83mapentin 100mg qd w46mable to take PO  Anemia of Chronic Disease: Likely related to CKD. Hgb 10.7 on admission (baseline ~10) - Trend  Hgb  HLD: Last lipid panel 08/09/17 with Chol 92, HDL 24, LDL 53, and TG 77. - Restart home Lipitor 53m daily when able to take PO  OSA: Chronic. Stable. - CPAP qhs  GERD: Chronic. Stable. -  Restart home Protonix 471mbid until able to take PO  FEN/GI: Advance diet as tolerated PPx: Coumadin for DVT/PD  Disposition: Discharge home in next two days  Subjective:  Today patient states he feels somewhat better but had 7 episodes of vomiting last night. Vomiting was NBNB. His abdominal pain is somewhat tolerable but still hurts. He is also complaining of having hiccups. He states his skin is very dry but otherwise he is doing okay. He is having bowel movement that are normal consistency but the amount has been less than normal.  Objective: Temp:  [97.6 F (36.4 C)-98.3 F (36.8 C)] 97.6 F (36.4 C) (02/05 0500) Pulse Rate:  [45-100] 86 (02/05 0500) Resp:  [18] 18 (02/05 0500) BP: (113-185)/(83-119) 148/83 (02/05 0500) SpO2:  [96 %-100 %] 96 % (02/05 0500) Weight:  [184 lb 11.9 oz (83.8 kg)-194 lb 10.7 oz (88.3 kg)] 184 lb 11.9 oz (83.8 kg) (02/04 1909) Physical Exam: Gen: Alert and Oriented x 3, NAD HEENT: Normocephalic, atraumatic, PERRLA, EOMI CV: RRR, no murmurs, normal S1, S2 split, +2 pulses dorsalis pedis bilaterally Resp: CTAB, no wheezing, rales, or rhonchi, comfortable work of breathing Abd: mild distention, mildly tender to palpation in all quadrants, firm, +bs in all four quadrants Ext: no clubbing, cyanosis, or edema Skin: warm, dry, intact, extremely dry and scaly skin  Laboratory: Recent Labs  Lab 08/24/17 0042 08/25/17 1600  WBC 5.1 8.5  HGB 10.7* 10.9*  HCT 33.1* 33.5*  PLT 142* 126*   Recent Labs  Lab 08/24/17 0042 08/25/17 1600  NA 140 138  K 5.0 7.0*  CL 101 100*  CO2 22 14*  BUN 75* 107*  CREATININE 9.83* 12.03*  CALCIUM 8.8* 8.5*  PROT 7.3  --   BILITOT 0.9  --   ALKPHOS 124  --   ALT 18  --   AST 29  --   GLUCOSE 101* 35*   2/2 - U/A: Pending 2/3 - Lipase: 88 2/3 - Protime/INR: 15.9/16.8  Imaging/Diagnostic Tests:  2/3 - CXR:  FINDINGS: Cardiomegaly with pulmonary vascular congestion. Very mild interstitial edema is  possible, although this is improved from the prior. Suspected trace bilateral pleural effusions. No pneumothorax.  IMPRESSION: Cardiomegaly with possible mild interstitial edema, although improved from the prior.  Trace bilateral pleural effusions.  2/3 - Abd X-ray: FINDINGS: Nonobstructive bowel gas pattern.  Multiple air-fluid levels on this single upright abdominal radiograph, raising the possibility of small bowel enteritis.  No evidence of free air under the diaphragm on the upright view.  IMPRESSION: No evidence of small bowel obstruction or free air.  Multiple air-fluid levels, raising the possibility of small bowel Enteritis.  2/4 - KUB:   LoNuala AlphaDO 08/26/2017, 7:28 AM PGY-1, [place] pager: [phone] pages welcome
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
## 2017-08-26 NOTE — Progress Notes (Signed)
1826 Pt's BP is elevated 160/130- PRN Labetalol given.

## 2017-08-26 NOTE — Progress Notes (Signed)
1530 Patient's BP at 1300 161/106, Norvasc given. BP at 1523 151/125. Nuala Alpha, DO notified. Patient refuses Ultram and is requesting other pain medication.   MD changed pain med to Fentanyl and stated to monitor BP.

## 2017-08-26 NOTE — Progress Notes (Signed)
[facility] Progress Note  Subjective:  Still with abdominal pain. Vomiting improved. Tolerating PO.  Question of cirrhosis on abd CT.  HD yesterday 4.5L off    Vitals:   08/25/17 1900 08/25/17 1909 08/25/17 2012 08/26/17 0500  BP: (!) 161/110 (!) 173/107 (!) 181/112 (!) 148/83  Pulse: 61 61 100 86  Resp:  _0 Temp:  98.3 F (36.8 C) 98 F (36.7 C) 97.6 F (36.4 C)  TempSrc:  Oral Oral Oral  SpO2:  96% 100% 96%  Weight:  83.8 kg (184 lb 11.9 oz)    Height:        Inpatient medications: . cloNIDine  0.3 mg Transdermal Weekly  . feeding supplement (NEPRO CARB STEADY)  237 mL Oral TID WC  . warfarin  7.5 mg Oral q1800  . Warfarin - Pharmacist Dosing Inpatient   Does not apply q1800    ammonium lactate, fentaNYL (SUBLIMAZE) injection, hydrOXYzine, labetalol, ondansetron **OR** ondansetron (ZOFRAN) IV  Exam: General: WDWN male lying in bed NAD Head: NCAT sclera not icteric +facial edema  Lungs: faint crackles at bases  Heart: RRR with S1 S2 Abdomen: distended +tender to mild palpation  Lower extremities: Trace LE edema bilat  Dialysis Access: LUE AVF +thrill    Dialysis: [facility] 4h   78kg   2/2.25 bath  Hep none  L AVF Hectorol 75mg IV TIW Venofer 538mIV q week Mircera 15071mq 2 weeks (last 1/25)      Impression: 1  Abd pain / recur pancreatitis - GI consult pending. For w/u ?cirrhosis on CT  2  ESRD HD mwf 3  Vol overload 4  Anemia Hb 10, follow 5  HTN vol up 6  Hx DVT on coumadin   Plan - HD tomorrow. UF 4-5L    OgeLynnda [facility] Pager [phone]/2019,3:20 PM  Pt seen, examined and agree w A/P as above.  RobKelly Splinter [facility] pager [phone]/11/2017, 3:44 PM      Recent Labs  Lab 08/24/17 0042 08/25/17 1600 08/26/17 0857  NA 140 138 136  K 5.0 7.0* 4.7  CL 101 100* 97*  CO2 22 14* 23  GLUCOSE 101* 35* 116*  BUN 75* 107* 47*  CREATININE 9.83*  12.03* 7.76*  CALCIUM 8.8* 8.5* 8.6*   Recent Labs  Lab 08/24/17 0042  AST 29  ALT 18  ALKPHOS 124  BILITOT 0.9  PROT 7.3  ALBUMIN 3.0*   Recent Labs  Lab 08/24/17 0042 08/25/17 1600  WBC 5.1 8.5  HGB 10.7* 10.9*  HCT 33.1* 33.5*  MCV 90.7 89.1  PLT 142* 126*   Iron/TIBC/Ferritin/ %Sat    Component Value Date/Time   IRON 41 (L) 05/29/2016 1350   TIBC 190 (L) 05/29/2016 1350   FERRITIN 328 11/02/2015 0346   IRONPCTSAT 22 05/29/2016 1350

## 2017-08-26 NOTE — Progress Notes (Signed)
ANTICOAGULATION CONSULT NOTE - Initial Consult  Pharmacy Consult for Heparin Indication: h/o DVT  Allergies  Allergen Reactions  . Butalbital-Apap-Caffeine Shortness Of Breath, Swelling and Other (See Comments)    Swelling in throat  . Ferrlecit [Na Ferric Gluc Cplx In Sucrose] Shortness Of Breath, Swelling and Other (See Comments)    Swelling in throat, tolerates Venofor  . Minoxidil Shortness Of Breath  . Tylenol [Acetaminophen] Anaphylaxis and Swelling  . Darvocet [Propoxyphene N-Acetaminophen] Hives    Patient Measurements: Height: 6' 2" (188 cm) Weight: 184 lb 11.9 oz (83.8 kg) IBW/kg (Calculated) : 82.2  Vital Signs: Temp: 98 F (36.7 C) (02/04 2012) Temp Source: Oral (02/04 2012) BP: 181/112 (02/04 2012) Pulse Rate: 100 (02/04 2012)  Labs: Recent Labs    08/24/17 0042 08/24/17 0453 08/25/17 1600  HGB 10.7*  --  10.9*  HCT 33.1*  --  33.5*  PLT 142*  --  126*  LABPROT  --  15.9*  --   INR  --  1.29  --   CREATININE 9.83*  --  12.03*    Estimated Creatinine Clearance: 8.3 mL/min (A) (by C-G formula based on SCr of 12.03 mg/dL (H)).  Assessment: 52 y.o. male with h/o DVT, Coumadin on hold, for heparin.   Noted heparin requirements from prior admission. Goal of Therapy:  Heparin level 0.3-0.7 units/ml Monitor platelets by anticoagulation protocol: Yes   Plan:  Start heparin 1400 units/hr Check heparin level in 8 hours.   Frank [name], Bronson Curb 08/26/2017,1:31 AM

## 2017-08-26 NOTE — Progress Notes (Signed)
Patient refused heparin IV bolus,he stated he don't need it as his INR level is not accurate because since he had been in the hospital , his coumadin dosage had been messed up. Explained to him the reason for heparin but he argues and continues to refuse it.

## 2017-08-26 NOTE — Progress Notes (Signed)
Patient states that he does not need any help with the CPAP machine. Will place self on once ready for bed.

## 2017-08-27 ENCOUNTER — Inpatient Hospital Stay (HOSPITAL_COMMUNITY): Payer: Medicare Other

## 2017-08-27 ENCOUNTER — Other Ambulatory Visit: Payer: Self-pay

## 2017-08-27 DIAGNOSIS — R1084 Generalized abdominal pain: Secondary | ICD-10-CM

## 2017-08-27 DIAGNOSIS — I5022 Chronic systolic (congestive) heart failure: Secondary | ICD-10-CM

## 2017-08-27 DIAGNOSIS — K863 Pseudocyst of pancreas: Secondary | ICD-10-CM

## 2017-08-27 DIAGNOSIS — I313 Pericardial effusion (noninflammatory): Secondary | ICD-10-CM

## 2017-08-27 DIAGNOSIS — R112 Nausea with vomiting, unspecified: Secondary | ICD-10-CM

## 2017-08-27 DIAGNOSIS — R932 Abnormal findings on diagnostic imaging of liver and biliary tract: Secondary | ICD-10-CM

## 2017-08-27 DIAGNOSIS — Z01810 Encounter for preprocedural cardiovascular examination: Secondary | ICD-10-CM

## 2017-08-27 DIAGNOSIS — I340 Nonrheumatic mitral (valve) insufficiency: Secondary | ICD-10-CM

## 2017-08-27 LAB — RENAL FUNCTION PANEL
ALBUMIN: 2.7 g/dL — AB (ref 3.5–5.0)
ANION GAP: 16 — AB (ref 5–15)
BUN: 78 mg/dL — ABNORMAL HIGH (ref 6–20)
CHLORIDE: 100 mmol/L — AB (ref 101–111)
CO2: 21 mmol/L — ABNORMAL LOW (ref 22–32)
Calcium: 8.1 mg/dL — ABNORMAL LOW (ref 8.9–10.3)
Creatinine, Ser: 10.32 mg/dL — ABNORMAL HIGH (ref 0.61–1.24)
GFR calc Af Amer: 6 mL/min — ABNORMAL LOW (ref >60)
GFR, EST NON AFRICAN AMERICAN: 5 mL/min — AB (ref >60)
Glucose, Bld: 119 mg/dL — ABNORMAL HIGH (ref 65–99)
PHOSPHORUS: 6.6 mg/dL — AB (ref 2.5–4.6)
POTASSIUM: 5.2 mmol/L — AB (ref 3.5–5.1)
Sodium: 137 mmol/L (ref 135–145)

## 2017-08-27 LAB — ECHOCARDIOGRAM COMPLETE
HEIGHTINCHES: 74 in
Weight: 2955.93 oz

## 2017-08-27 LAB — CBC
HCT: 30.1 % — ABNORMAL LOW (ref 39.0–52.0)
HEMOGLOBIN: 9.9 g/dL — AB (ref 13.0–17.0)
MCH: 28.5 pg (ref 26.0–34.0)
MCHC: 32.9 g/dL (ref 30.0–36.0)
MCV: 86.7 fL (ref 78.0–100.0)
Platelets: 132 10*3/uL — ABNORMAL LOW (ref 150–400)
RBC: 3.47 MIL/uL — ABNORMAL LOW (ref 4.22–5.81)
RDW: 19 % — AB (ref 11.5–15.5)
WBC: 3.5 10*3/uL — AB (ref 4.0–10.5)

## 2017-08-27 LAB — GLUCOSE, CAPILLARY: GLUCOSE-CAPILLARY: 156 mg/dL — AB (ref 65–99)

## 2017-08-27 LAB — HEPATITIS PANEL, ACUTE
HCV Ab: <0.1 s/co ratio (ref 0.0–0.9)
HEP B C IGM: NEGATIVE
HEP B S AG: NEGATIVE
Hep A IgM: NEGATIVE

## 2017-08-27 LAB — PROTIME-INR
INR: 3.31
PROTHROMBIN TIME: 33.4 s — AB (ref 11.4–15.2)

## 2017-08-27 MED ORDER — SODIUM CHLORIDE 0.9 % IV SOLN: 100.0000 mL | INTRAVENOUS | Status: DC | PRN

## 2017-08-27 MED ORDER — PENTAFLUOROPROP-TETRAFLUOROETH EX AERO: 1.0000 "application " | INHALATION_SPRAY | CUTANEOUS | Status: DC | PRN

## 2017-08-27 MED ORDER — HEPARIN SODIUM (PORCINE) 1000 UNIT/ML DIALYSIS: 1000.0000 [IU] | INTRAMUSCULAR | Status: DC | PRN

## 2017-08-27 MED ORDER — LIDOCAINE-PRILOCAINE 2.5-2.5 % EX CREA: 1.0000 "application " | TOPICAL_CREAM | CUTANEOUS | Status: DC | PRN

## 2017-08-27 MED ORDER — DOXERCALCIFEROL 4 MCG/2ML IV SOLN
INTRAVENOUS | Status: AC
  Administered 2017-08-27: 8 ug via INTRAVENOUS
  Filled 2017-08-27: qty 4

## 2017-08-27 MED ORDER — HYDROMORPHONE HCL 1 MG/ML IJ SOLN
1.0000 mg | INTRAMUSCULAR | Status: DC | PRN
  Administered 2017-08-27 – 2017-08-30 (×15): 1 mg via INTRAVENOUS
  Filled 2017-08-27 (×13): qty 1

## 2017-08-27 MED ORDER — LIDOCAINE HCL (PF) 1 % IJ SOLN: 5.0000 mL | INTRAMUSCULAR | Status: DC | PRN

## 2017-08-27 MED ORDER — FENTANYL CITRATE (PF) 100 MCG/2ML IJ SOLN
50.0000 ug | INTRAMUSCULAR | Status: DC | PRN
  Administered 2017-08-27: 50 ug via INTRAVENOUS
  Filled 2017-08-27: qty 2

## 2017-08-27 MED ORDER — HYDROMORPHONE HCL 1 MG/ML IJ SOLN
INTRAMUSCULAR | Status: AC
  Administered 2017-08-27: 1 mg via INTRAVENOUS
  Filled 2017-08-27: qty 1

## 2017-08-27 NOTE — Progress Notes (Signed)
Patient refused to go to dialysis this morning, stating he wanted to wait until after his paracentesis. Notified patient that there is no paracentesis scheduled for him today and there are no orders placed indicating he will have a paracentesis today. Patient stated there must be a miscommunication, and to call dialysis back and tell them he will not go to dialysis until after his procedure. I called dialysis back and the nurse requested to speak to the patient over the phone. I spoke with the dialysis nurse afterward and she stated the patient still refused to go to dialysis this morning. The patient stated,"It's not that I don't want to go," just that he would go to dialysis after his paracentesis. I notified [facility] Teaching Service via text page concerning the situation. The physician returned my call and confirmed there is no paracentesis scheduled for the patient today, and that the patient should not delay his dialysis. She stated they will talk to the patient when they come to see him this morning.

## 2017-08-27 NOTE — Progress Notes (Signed)
ANTICOAGULATION CONSULT NOTE - Follow Up Consult  Pharmacy Consult for Coumadin Indication: h/o VTE  Allergies  Allergen Reactions  . Butalbital-Apap-Caffeine Shortness Of Breath, Swelling and Other (See Comments)    Swelling in throat  . Ferrlecit [Na Ferric Gluc Cplx In Sucrose] Shortness Of Breath, Swelling and Other (See Comments)    Swelling in throat, tolerates Venofor  . Minoxidil Shortness Of Breath  . Tylenol [Acetaminophen] Anaphylaxis and Swelling  . Darvocet [Propoxyphene N-Acetaminophen] Hives    Patient Measurements: Height: _0  (188 cm) Weight: 184 lb 11.9 oz (83.8 kg) IBW/kg (Calculated) : 82.2 Heparin Dosing Weight:    Vital Signs: Temp: 98 F (36.7 C) (02/06 0445) Temp Source: Oral (02/06 0445) BP: 152/99 (02/06 0900) Pulse Rate: 72 (02/06 0900)  Labs: Recent Labs    08/25/17 1600 08/26/17 0656 08/26/17 0857 08/27/17 1035  HGB 10.9*  --   --   --   HCT 33.5*  --   --   --   PLT 126*  --   --   --   LABPROT  --  23.6*  --  33.4*  INR  --  2.12  --  3.31  CREATININE 12.03*  --  7.76*  --     Estimated Creatinine Clearance: 12.8 mL/min (A) (by C-G formula based on SCr of 7.76 mg/dL (H)).   Assessment:  Anticoag: On Coumadin for hx of DVTs. INR on admit is low at 1.29>2.12>3.31.  Hgb 10.9, plts 142>126 - Coumadin PTA-unsure of home dose (med rec says 7.80m x 2 then see Coumadin pharmacist)  Goal of Therapy:  INR 2-3 Monitor platelets by anticoagulation protocol: Yes   Plan:  Hold Coumadin today Daily PT/INR   Paracentesis cancelled  Brocha Gilliam S. RAlford Highland PharmD, BCPS Clinical Staff Pharmacist Pager [phone] REilene GhaziStillinger 08/27/2017,1:28 PM
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
## 2017-08-27 NOTE — Progress Notes (Signed)
[name]: [phone]  Patient name: Frank [name] Medical record number: [id number] Date of birth: May 18, 1966 Age: 52 y.o. Gender: male  Primary Care Provider: Lolita Patella, MD Consultants: Nephrology Code Status: Full  Pt Overview and Major Events to Date:  TAHJAE [name] is a 52 y.o. male presenting with abdominal pain thought to be secondary to acute on chronic pancreatitis. PMH is significant for PMH is significant forESRD on MWF HD, HTN, HFrEF, NICM,DVT/PE on Coumadin, anxiety and depression, chronic pain syndrome, anemia of chronic disease, GERD, chronic pancreatitis, HLD, OSA.  Assessment and Plan:  Abdominal Pain: Acute on Chronic. Possible acute on chronic pancreatitis. Lipase is mildly elevated. Patient has history of multiple admissions due to epigastric pain and multiple episodes of pancreatitis. His presenting symptoms of abdominal pain, nausea, vomiting, with elevated lipase are consistent with his previous episodes. Abdominal x-ray showed no evidence of small bowel obstruction or free air but did show air gastric levels consistent with enteritis. CT Abd showed no acute bowel pathology. Given distention and abdominal pain with new finding of Cirrhosis concern for SBP however, he is improving since admission which is encouraging. GI does not feel current symptoms are due to chronic pancreatitis. Spoke with IR to see if paracentesis could be performed and IR said no ascites present. They would not recommend percutaneous drainage of pancreatic pseudocyst that has enlarged since December and could be a cause for his current pain. - Consult to Gastroenterology; appreciate recs - Paracentesis diagnostic to be performed today - Zofran prn for nausea -  Advance diet as tolerated, soft solids transition to reg diet today if tolerated - Cont Fentanyl 55mg q2 prn for pain;  - Tylenol  for pain prn - Per GI, Pancreatic pseudocyst is not  amendable to IR drainage. - Restarted Protonix 426mBID - Surgery consult for concern of acalculous cholecystitis seen on Abd U/S, Surgery to order HIDA scan  Cirrohsis: Acute finding, likely from chronic disease. Patient does not endorse history of ETOH consumption. Unclear if he had Hep C but was tested several years ago. Hepatitis C panel negative. Spoke with IR to see if paracentesis could be performed and IR said no ascites present. They would not recommend percutaneous drainage of pancreatic pseudocyst that has enlarged since December. - Diagnostic Parcentesis unable to be performed due to IR talked to and said no ascites present.  - Consult to GI; appreciate recs.   Shortness of Breath: Acute. Improved. Patient presented with symptoms of SOB and no documented COPD or Asthma. He takes no medications at home for any respiratory issues. CXR showed some mild interstial edema and pulmonary vascular congestion, and trace bilateral effusions. Overall, his CXR was improved from previous. Likely caused by fluid overload needing HD. Does have Cardiomegaly. CT Abd did show enlarging pericardial effusion with right pleural effusion and dependent atelectasis in the right lung. - Echocardiogram results pending - cont monitor for symptoms - continue to monitor O2 sats  ESRD on MWF HD: [facility] Has not missed any HD sessions recently per patient. K 5.0 and patient does not appear volume overloaded - Restart home Renvela 2,40026mid until able to take PO - Nephro consult on Monday for HD  HTN: BP now within goal at 140/82 this am. Likely elevated due to pain and not being able to keep down BP meds at home. - Cont home Norvasc 59m76mily, Coreg 25mg61m, Doxazosin 4mg q29m-  Discontinued Hydralazine 1108m daily - Likely need to switch home Hydralazine to tid dosing on discharge home  HFrEF / NICM: ECHO 10/31/15 with EF 35-40%, global hypokinesis, severe LAE, moderate RAI,  moderate TR. Does not appear volume overloaded. - Cont Coreg 279mbid and Imdur 608maily - Fluid status management with HD  Hx DVT/PE: Has had multiple DVTs in the past. On Coumadin at home. - Coumadin per pharm  Anxiety/depression: Chronic, stable. Denies any feelings of depression on admission. - Cont Amitriptyline  Chronic Pain Syndrome: Chronic. - cont Amitriptyline 55m38md Gabapentin 100mg70memia of Chronic Disease: Likely related to CKD. Hgb 10.7 on admission (baseline ~10) - Trend Hgb  HLD: Last lipid panel 08/09/17 with Chol 92, HDL 24, LDL 53, and TG 77. - Restart home Lipitor 55mg 41my when able to take PO  OSA: Chronic. Stable. - CPAP qhs  GERD: Chronic. Stable. - Restart home Protonix 40mg b40mntil able to take PO  FEN/GI: Advance diet as tolerated PPx: Coumadin for DVT/PD  Disposition: Discharge home  Subjective:  Today patient states he is still having abdominal pain. He is no longer vomiting but is still not eating a lot of solid food. He tolerated clear liquid and soft solid diet. His abdominal pain "feels different". He states Tramadol does not help him. Otherwise, he has not complaints. He is passing gas but not having significant bowel movements.  Objective: Temp:  [97.5 F (36.4 C)-98.2 F (36.8 C)] 98 F (36.7 C) (02/06 0445) Pulse Rate:  [71-92] 73 (02/06 0445) Resp:  [18-20] 20 (02/06 0445) BP: (135-177)/(93-130) 135/93 (02/06 0445) SpO2:  [97 %-100 %] 97 % (02/06 0445) Physical Exam: Gen: Alert and Oriented x 3, NAD HEENT: Normocephalic, atraumatic, PERRLA, EOMI CV: RRR, no murmurs, normal S1, S2 split, +2 pulses dorsalis pedis bilaterally Resp: CTAB, no wheezing, rales, or rhonchi, comfortable work of breathing Abd: mild distention, mildly tender to palpation in all quadrants, soft, +bs in all four quadrants Ext: no clubbing, cyanosis, or edema Skin: warm, dry, intact  Laboratory: Recent Labs  Lab 08/24/17 0042 08/25/17 1600   WBC 5.1 8.5  HGB 10.7* 10.9*  HCT 33.1* 33.5*  PLT 142* 126*   Recent Labs  Lab 08/24/17 0042 08/25/17 1600 08/26/17 0857  NA 140 138 136  K 5.0 7.0* 4.7  CL 101 100* 97*  CO2 22 14* 23  BUN 75* 107* 47*  CREATININE 9.83* 12.03* 7.76*  CALCIUM 8.8* 8.5* 8.6*  PROT 7.3  --   --   BILITOT 0.9  --   --   ALKPHOS 124  --   --   ALT 18  --   --   AST 29  --   --   GLUCOSE 101* 35* 116*   2/2 - U/A: Pending 2/3 - Lipase: 88 2/3 - Protime/INR: 15.9/16.8 2/5 - Hep C Panel: HCV Ab <0.1 2/6 - Amylase: pending 2/6 - Albumin: pending 2/6 - Anti-smooth muscle: pending 2/6 - ANA w/reflex: pending 2/6 - Alpha-1-antitrypsin: pending 2/6 - mitochondrial antibody: pending  Imaging/Diagnostic Tests:  2/3 - CXR:  FINDINGS: Cardiomegaly with pulmonary vascular congestion. Very mild interstitial edema is possible, although this is improved from the prior. Suspected trace bilateral pleural effusions. No pneumothorax.  IMPRESSION: Cardiomegaly with possible mild interstitial edema, although improved from the prior.  Trace bilateral pleural effusions.  2/3 - Abd X-ray: FINDINGS: Nonobstructive bowel gas pattern.  Multiple air-fluid levels on this single upright abdominal radiograph, raising the possibility of small  bowel enteritis.  No evidence of free air under the diaphragm on the upright view.  IMPRESSION: No evidence of small bowel obstruction or free air.  Multiple air-fluid levels, raising the possibility of small bowel Enteritis.  2/4 - KUB: IMPRESSION: Oral contrast present for CT. No suspicion of ileus, obstruction or free air.  2/6 - Abd U/S: Pending  2/6 - U/S Paracentesis: Pending  Nuala Alpha, DO 08/27/2017, 7:24 AM PGY-1, [place] Intern pager: [phone], text pages welcome

## 2017-08-27 NOTE — Progress Notes (Signed)
[facility] this AM to discuss with them the Pt's pain management. Pt is c/o pain and not controlled by the Fentanyl that is ordered.  Spoke to MD and they would review Pt requested Pain medicine at 1 PM and I took in the Fentanyl.  Pt advised that MD was in and they were going to change the med.  Pt refused the Fentanyl.
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
## 2017-08-27 NOTE — Progress Notes (Signed)
Pt refused VS

## 2017-08-27 NOTE — Progress Notes (Signed)
Spoke with [facility] this afternoon.  They believe that patient's pancreatic cyst is enlarging and causing predominance of patient's abdominal symptoms.  They recommend transfer to [facility], likely due, for endoscopic drainage.  One of GI is primary concerns, was patient's cardiac suitability for procedure.  Echo shows worsening EF 20-25% with mild to moderate pericardial effusion.  We have consulted cardiology for patient evaluation for possible procedure.

## 2017-08-27 NOTE — Progress Notes (Signed)
  Echocardiogram 2D Echocardiogram has been performed.  Frank [name] F 08/27/2017, 2:18 PM

## 2017-08-27 NOTE — Progress Notes (Signed)
[facility] Progress Note  Subjective:  C/o abdominal pain  Didn't want to go to HD this morning wanted paracentesis first. No indication for paracentesis on abd [place]. Paracentesis cancelled.  Pericardial effusion on [place]   Vitals:   08/26/17 2041 08/27/17 0039 08/27/17 0445 08/27/17 0900  BP: (!) 158/114 (!) 141/99 (!) 135/93 (!) 152/99  Pulse: 89 71 73 72  Resp: 18  20   Temp: 98.2 F (36.8 C)  98 F (36.7 C)   TempSrc: Axillary  Oral   SpO2: 97%  97%   Weight:      Height:        Inpatient medications: . amitriptyline  10 mg Oral QHS  . amLODipine  10 mg Oral Daily  . atorvastatin  10 mg Oral q1800  . carvedilol  25 mg Oral BID WC  . doxazosin  4 mg Oral QHS  . doxercalciferol  8 mcg Intravenous Q M,W,F-HD  . feeding supplement (NEPRO CARB STEADY)  237 mL Oral TID WC  . gabapentin  100 mg Oral QHS  . pantoprazole  40 mg Oral BID  . Warfarin - Pharmacist Dosing Inpatient   Does not apply q1800    ammonium lactate, fentaNYL (SUBLIMAZE) injection, hydrOXYzine, labetalol, ondansetron **OR** ondansetron (ZOFRAN) IV  Exam: General: WDWN male lying in bed NAD Head: NCAT sclera not icteric +facial edema  Lungs: faint crackles at bases  Heart: RRR with S1 S2 Abdomen: distended +tender to mild palpation  Lower extremities: Trace LE edema bilat  Dialysis Access: LUE AVF +thrill    Dialysis: MWF South 4h   78kg   2/2.25 bath  Hep none  L AVF Hectorol 66mg IV TIW Venofer 521mIV q week Mircera 15083mq 2 weeks (last 1/25)      Impression: 1  Abd pain / recur pancreatitis/Ab normal liver imaging  - GI following. Ab US Koreath loculated pseudocyst increased in size  2 Pericardial effusion on US KoreaEcho pending  2  ESRD HD mwf 3  Vol overload 4  Anemia Hb 10, follow 5  HTN vol up 6  Hx DVT on coumadin   Plan - HD today. UF 4-5L    OgeLynnda [facility] Pager [phone],2:25 PM  Pt seen, examined and agree w A/P  as above.  [facility]Kelly [facility] pager [phone]/12/2017, 2:50 PM    Recent Labs  Lab 08/24/17 0042 08/25/17 1600 08/26/17 0857  NA 140 138 136  K 5.0 7.0* 4.7  CL 101 100* 97*  CO2 22 14* 23  GLUCOSE 101* 35* 116*  BUN 75* 107* 47*  CREATININE 9.83* 12.03* 7.76*  CALCIUM 8.8* 8.5* 8.6*   Recent Labs  Lab 08/24/17 0042  AST 29  ALT 18  ALKPHOS 124  BILITOT 0.9  PROT 7.3  ALBUMIN 3.0*   Recent Labs  Lab 08/24/17 0042 08/25/17 1600  WBC 5.1 8.5  HGB 10.7* 10.9*  HCT 33.1* 33.5*  MCV 90.7 89.1  PLT 142* 126*   Iron/TIBC/Ferritin/ %Sat    Component Value Date/Time   IRON 41 (L) 05/29/2016 1350   TIBC 190 (L) 05/29/2016 1350   FERRITIN 328 11/02/2015 0346   IRONPCTSAT 22 05/29/2016 1350

## 2017-08-27 NOTE — Progress Notes (Signed)
Daily Rounding Note  08/27/2017, 9:05 AM  LOS: 3 days   SUBJECTIVE:   Chief complaint: abdominal pain in LUQ and nausea conti       Had ultrasound this AM but not paracentesis which is planned later today.  Pt for HD this AM.   No ultrasound report yet Using fentanyl for pain, received 150 mg yesterday and 100 mg so far today.  OBJECTIVE:         Vital signs in last 24 hours:    Temp:  [97.5 F (36.4 C)-98.2 F (36.8 C)] 98 F (36.7 C) (02/06 0445) Pulse Rate:  [71-92] 73 (02/06 0445) Resp:  [18-20] 20 (02/06 0445) BP: (135-177)/(93-130) 135/93 (02/06 0445) SpO2:  [97 %-100 %] 97 % (02/06 0445) Last BM Date: 08/25/17 Filed Weights   08/24/17 1219 08/25/17 1450 08/25/17 1909  Weight: 88 kg (194 lb 0.1 oz) 88.3 kg (194 lb 10.7 oz) 83.8 kg (184 lb 11.9 oz)   General: chronically ill looking, comfortable   Heart: RRR Chest: clear bil.  No dyspnea or cough Abdomen: slightly distended, tender without guarding across upper abdomen, > on left.  BS active  Extremities: no CCE Neuro/Psych:  Oriented x 3.  No tremor.  More engaged and alert than when seen yesterday afternoon.    Intake/Output from previous day: 02/05 0701 - 02/06 0700 In: 360 [P.O.:360] Out: 0   Intake/Output this shift: Total I/O In: 300 [P.O.:300] Out: -   Lab Results: Recent Labs    08/25/17 1600  WBC 8.5  HGB 10.9*  HCT 33.5*  PLT 126*   BMET Recent Labs    08/25/17 1600 08/26/17 0857  NA 138 136  K 7.0* 4.7  CL 100* 97*  CO2 14* 23  GLUCOSE 35* 116*  BUN 107* 47*  CREATININE 12.03* 7.76*  CALCIUM 8.5* 8.6*   LFT No results for input(s): PROT, ALBUMIN, AST, ALT, ALKPHOS, BILITOT, BILIDIR, IBILI in the last 72 hours. PT/INR Recent Labs    08/26/17 0656  LABPROT 23.6*  INR 2.12   Hepatitis Panel Recent Labs    08/26/17 1416  HEPBSAG Negative  HCVAB <0.1  HEPAIGM Negative  HEPBIGM Negative    Studies/Results: Ct  Abdomen Pelvis Wo Contrast  Result Date: 08/25/2017 CLINICAL DATA:  Abdominal distension. Worsening abdominal pain over the last 3 days. EXAM: CT ABDOMEN AND PELVIS WITHOUT CONTRAST TECHNIQUE: Multidetector CT imaging of the abdomen and pelvis was performed following the standard protocol without IV contrast. COMPARISON:  CT 08/08/2017 FINDINGS: Lower chest: Pericardial effusion has gotten larger. There is now a pleural effusion on the right with dependent pulmonary atelectasis. Mild atelectasis again seen in the lingula. Hepatobiliary: Increasing prominence of the left lobe of the liver raises the possibility of developing cirrhosis. No sign of focal hepatic lesion on this noncontrast study. No calcified gallstones. Pancreas: Atrophic changes of the pancreas.  No mass. Spleen: Normal Adrenals/Urinary Tract: Adrenal glands are normal. Chronic renal atrophy with a chronic cyst of the left kidney. Atrophic transplant kidney in the left iliac fossa. Small amount of fluid in the bladder. Stomach/Bowel: No sign of bowel obstruction by CT. Vascular/Lymphatic: Widespread atherosclerosis.  No aneurysm. Reproductive: Normal Other: Ascites, ascites, freely distributed but with focal accumulation in the lesser sac as seen previously. Overall ir is probably slightly more fluid. I do not see evidence of an abscess. No free intraperitoneal air. Musculoskeletal: Negative IMPRESSION: Enlarging pericardial effusion. Newly seen right pleural effusion with  dependent atelectasis in the right lung. Redemonstration of ascites with focal collection in the lesser sac, similar in size to the previous study. Slightly larger volume of ascites elsewhere within the abdomen. Question cirrhosis of the liver. Chronic renal atrophy. Chronic atrophy of the left iliac fossa transplant kidney. No acute bowel pathology identifiable. Electronically Signed   By: [name] M.D.   On: 08/25/2017 22:51   Dg Abd 2 Views  Result Date:  08/25/2017 CLINICAL DATA:  Abdominal distension and pain over the last 3 days. EXAM: ABDOMEN - 2 VIEW COMPARISON:  CT same day. FINDINGS: Oral contrast is present within the bowel. There are some small air-fluid levels but no evidence of abnormally dilated loops of significance. No free air. Extensive vascular calcification, including within a transplant kidney in the left iliac fossa. No acute bone finding. IMPRESSION: Oral contrast present for CT. No suspicion of ileus, obstruction or free air. Electronically Signed   By: [name] M.D.   On: 08/25/2017 22:54    ASSESMENT:   *   Chronic abdominal pain, nausea, vomiting. CT scan 1/18 showed fluid in the region of the gastrosplenic ligament suspicious for pancreatic pseudocyst,  and showed ascites.  Repeat CT scan 2/4 showing ascites, question cirrhosis of the liver, pancreatic atrophy but no fluid collection/pancreatic pseudocyst. Innumerable previous CT scans of abdomen pelvis at times have shown fatty liver but no cirrhosis. Acute hepatitis panel entirely negative.  *   Pericardial effusion.  *   ESRD.     PLAN   *    Await results of ultrasound and paracentesis/fluid studies if able to perform. ANA, smooth muscle Ab, mitochondrial AB, alpha1 AT levels ordered.      Frank [name]  08/27/2017, 9:05 AM Pager: [phone]

## 2017-08-27 NOTE — Progress Notes (Signed)
FPTS Interim Progress Note  Spoke with on call physician for IR at [facility] who looked at Abd U/S and said paracentesis could not be performed b/c patient did not have ascites. His read of U/S and previous ultrasounds of patient demonstrated loculated cyst that has grown in size since December. IR also said this would not be a good candidate for percutaneous drainage and drainage of this cyst would require an endoscopic GI procedure that is not performed by GI at [facility]. He will likely need referral if patient decides to pursue this option.  I have cancelled the paracentesis.   Nuala Alpha, DO 08/27/2017, 12:27 PM PGY-1, [place] Medicine Service pager [phone]

## 2017-08-27 NOTE — Discharge Instructions (Signed)

## 2017-08-28 ENCOUNTER — Inpatient Hospital Stay (HOSPITAL_COMMUNITY): Payer: Medicare Other

## 2017-08-28 DIAGNOSIS — K7460 Unspecified cirrhosis of liver: Secondary | ICD-10-CM

## 2017-08-28 LAB — COMPREHENSIVE METABOLIC PANEL
ALBUMIN: 2.8 g/dL — AB (ref 3.5–5.0)
ALT: 127 U/L — ABNORMAL HIGH (ref 17–63)
ANION GAP: 15 (ref 5–15)
AST: 91 U/L — ABNORMAL HIGH (ref 15–41)
Alkaline Phosphatase: 109 U/L (ref 38–126)
BILIRUBIN TOTAL: 1.1 mg/dL (ref 0.3–1.2)
BUN: 32 mg/dL — AB (ref 6–20)
CO2: 25 mmol/L (ref 22–32)
Calcium: 8.4 mg/dL — ABNORMAL LOW (ref 8.9–10.3)
Chloride: 97 mmol/L — ABNORMAL LOW (ref 101–111)
Creatinine, Ser: 6.4 mg/dL — ABNORMAL HIGH (ref 0.61–1.24)
GFR calc Af Amer: 10 mL/min — ABNORMAL LOW (ref >60)
GFR calc non Af Amer: 9 mL/min — ABNORMAL LOW (ref >60)
GLUCOSE: 86 mg/dL (ref 65–99)
POTASSIUM: 4.1 mmol/L (ref 3.5–5.1)
SODIUM: 137 mmol/L (ref 135–145)
Total Protein: 7.1 g/dL (ref 6.5–8.1)

## 2017-08-28 LAB — CBC
HEMATOCRIT: 31.9 % — AB (ref 39.0–52.0)
HEMOGLOBIN: 10.3 g/dL — AB (ref 13.0–17.0)
MCH: 28.4 pg (ref 26.0–34.0)
MCHC: 32.3 g/dL (ref 30.0–36.0)
MCV: 87.9 fL (ref 78.0–100.0)
Platelets: 109 10*3/uL — ABNORMAL LOW (ref 150–400)
RBC: 3.63 MIL/uL — ABNORMAL LOW (ref 4.22–5.81)
RDW: 18.9 % — AB (ref 11.5–15.5)
WBC: 3.6 10*3/uL — AB (ref 4.0–10.5)

## 2017-08-28 LAB — PROTIME-INR
INR: 1.93
Prothrombin Time: 21.9 seconds — ABNORMAL HIGH (ref 11.4–15.2)

## 2017-08-28 LAB — GLUCOSE, CAPILLARY: Glucose-Capillary: 109 mg/dL — ABNORMAL HIGH (ref 65–99)

## 2017-08-28 MED ORDER — HYDROMORPHONE HCL 1 MG/ML IJ SOLN
INTRAMUSCULAR | Status: AC
  Administered 2017-08-28: 1 mg via INTRAVENOUS
  Filled 2017-08-28: qty 1

## 2017-08-28 MED ORDER — TECHNETIUM TC 99M MEBROFENIN IV KIT
5.0000 | PACK | Freq: Once | INTRAVENOUS | Status: AC | PRN
  Administered 2017-08-28: 5 via INTRAVENOUS

## 2017-08-28 MED ORDER — HEPARIN SODIUM (PORCINE) 5000 UNIT/ML IJ SOLN
5000.0000 [IU] | Freq: Three times a day (TID) | INTRAMUSCULAR | Status: DC
  Administered 2017-08-28: 5000 [IU] via SUBCUTANEOUS
  Filled 2017-08-28: qty 1

## 2017-08-28 MED ORDER — HEPARIN SODIUM (PORCINE) 5000 UNIT/ML IJ SOLN
5000.0000 [IU] | Freq: Three times a day (TID) | INTRAMUSCULAR | Status: DC
Start: 2017-08-28 — End: 2017-08-31
  Administered 2017-08-28 – 2017-08-31 (×7): 5000 [IU] via SUBCUTANEOUS
  Filled 2017-08-28 (×7): qty 1

## 2017-08-28 MED ORDER — WARFARIN SODIUM 6 MG PO TABS
6.0000 mg | ORAL_TABLET | Freq: Once | ORAL | Status: DC
  Filled 2017-08-28: qty 1

## 2017-08-28 NOTE — Progress Notes (Signed)
Phoned the Transfer Center at [facility] to check on the status of the Pt's transfer, since the last communication was that the Pt was going to go to [facility] and to keep the Pt NPO tonight.  I spoke to [facility], she advised she will check into the status of the schedule and call back to the unit to advise. The phone number of the transfer unit is [phone]

## 2017-08-28 NOTE — Progress Notes (Signed)
Informed patient he was to be NPO after midnight for procedure (HIDA scan) the following morning, 08/28/17. Patient refused to acknowledge he had a procedure scheduled, and he stated the procedure he was going to be NPO for has already been completed (Korea abd). I explained to the patient that I understand he was NPO for the [place] abd but he needed to be NPO again for a different procedure. The patient became irritable, verbally aggressive, and again did not acknowledge. I notified Internal Medicine Teaching Service and asked if they could speak with the patient concerning the procedure. Dr. Shawna Orleans returned my call and spoke with the patient via telephone concerning the HIDA scan and having to be NPO after midnight. The patient then acknowledged he had a procedure scheduled and that he needed to be NPO after midnight.

## 2017-08-28 NOTE — Progress Notes (Signed)
Called to radiology to receive clarification concerning the administration of narcotics before HIDA scan. Spoke with Dr. Radene Knee, who stated the patient should not have any narcotics at least 4 hours before the HIDA scan. Will inform patient upon his return from dialysis.

## 2017-08-28 NOTE — Progress Notes (Signed)
[name]: [phone]  Patient name: Frank [name] Medical record number: [id number] Date of birth: 03-27-1966 Age: 52 y.o. Gender: male  Primary Care Provider: No primary care provider on file. Consultants: Nephrology Code Status: Full  Pt Overview and Major Events to Date:  Frank [name] is a 52 y.o. male presenting with abdominal pain thought to be secondary to acute on chronic pancreatitis. PMH is significant for PMH is significant forESRD on MWF HD, HTN, HFrEF, NICM,DVT/PE on Coumadin, anxiety and depression, chronic pain syndrome, anemia of chronic disease, GERD, chronic pancreatitis, HLD, OSA.  Assessment and Plan:  Abdominal Pain: Acute on Chronic. After extensive work up patient is likely having pain from Abdominal x-ray showed no evidence of small bowel obstruction or free air but did show air gastric levels consistent with enteritis. CT Abd showed no acute bowel pathology. Given distention and abdominal pain with new finding of Cirrhosis concern Spoke with IR to see if paracentesis could be performed and IR said no ascites present. They would not recommend percutaneous drainage of pancreatic pseudocyst that has enlarged since December and could be a cause for his current pain. Patient is being transferred to [facility] today for EUS procedure.  - Consult to Gastroenterology; appreciate recs - Transfer to [place] likely Monday to have EUS to draine pancreatic pseudocyst. Transferring to [facility] as no one in [place] performs this procedure. - Zofran prn for nausea -  Diet as tolerated, soft solids transition to reg diet as tolerated - Dilaudid 22m q4, Tylenol prn for pain control - Cont Protonix 489mBID - Surgery consult for concern of acalculous cholecystitis seen on Abd U/S,  HIDA scan today  HFrEF / NICM: Acute. Worsened since last admission. ECHO 08/27/2017 shows EF 20-25%, global hypokinesis, severe LAE, mild mitral regurg. Does not  appear volume overloaded. Patient presented with symptoms of SOB and no documented COPD or Asthma. He takes no medications at home for any respiratory issues. CXR showed some mild interstial edema and pulmonary vascular congestion, and trace bilateral effusions. Overall, his CXR was improved from previous. Likely caused by fluid overload needing HD. Does have Cardiomegaly. CT Abd did show enlarging pericardial effusion with right pleural effusion and dependent atelectasis in the right lung. - Cont Coreg 2539mid and Imdur 38m83mily - Fluid status management with HD - Patient cleared for EUS procedure from Cardiac standpoint - Will need outpatient follow up for his HF - continue to monitor O2 sats  Cirrohsis: Acute finding, likely from chronic disease. Patient does not endorse history of ETOH consumption. Unclear if he had Hep C but was tested several years ago. Hepatitis C panel negative. Spoke with IR to see if paracentesis could be performed and IR said no ascites present. They would not recommend percutaneous drainage of pancreatic pseudocyst that has enlarged since December. - Diagnostic Parcentesis unable to be performed due to IR talked to and said no ascites present. See note above on abdominal pain - Consult to GI; appreciate recs.   ESRD on MWF HD: [facility] Has not missed any HD sessions recently per patient. K 5.0 and patient does not appear volume overloaded - Could consider restarting Renvela 2,400mg72m defer to Nephro - Nephro managing HD; greatly appreciate their recs and management for this patient  HTN: BP has been midly elevated but patient has had a lot of pain. SBP 135-160, DBP 65-106.  Likely elevated due to pain and not  being able to keep down BP meds at home. - Cont home Norvasc 174m daily, Coreg 260mbid, Doxazosin 74m18mhs - Consider restarting Hydralazine 100m35mily if BP remains elevated - Likely need to switch home Hydralazine to tid  dosing on discharge home  Hx DVT/PE: Has had multiple DVTs in the past. On Coumadin at home. - Coumadin per pharm  Anxiety/depression: Chronic, stable. Denies any feelings of depression on admission. - Cont Amitriptyline  Chronic Pain Syndrome: Chronic. - cont Amitriptyline 10mg83m Gabapentin 100mg 874mmia of Chronic Disease: Likely related to CKD. Hgb 10.7 on admission now at 9.9 (baseline ~10) - cont to trend Hgb  HLD: Last lipid panel 08/09/17 with Chol 92, HDL 24, LDL 53, and TG 77. - Cont home Lipitor 10mg  83m Chronic. Stable. - CPAP qhs  GERD: Chronic. Stable. - Restart home Protonix 40mg bi6mtil able to take PO  FEN/GI: Advance diet as tolerated PPx: Coumadin for DVT/PD  Disposition: Discharge home  Subjective:  Today patient states he is in good spirits and he understands [facility]. His abdominal pain is controlled with Dilaudid 1mg. He 47mstill having some bloating but denies chest pain, SOB, abdominal pain, nausea, vomiting, diarrhea, mild constipation. Objective: Temp:  [97.6 F (36.4 C)-98 F (36.7 C)] 97.7 F (36.5 C) (02/07 0640) Pulse Rate:  [68-88] 79 (02/07 0640) Resp:  [16-18] 18 (02/07 0640) BP: (134-160)/(65-106) 142/106 (02/07 0640) SpO2:  [93 %-99 %] 93 % (02/07 0640) Weight:  [192 lb 7.4 oz (87.3 kg)] 192 lb 7.4 oz (87.3 kg) (02/06 2130) Physical Exam:  Gen: Alert and Oriented x 3, NAD HEENT: Normocephalic, atraumatic, PERRLA, EOMI CV: RRR, no murmurs, normal S1, S2 split, +2 pulses dorsalis pedis bilaterally Resp: CTAB, no wheezing, rales, or rhonchi, comfortable work of breathing Abd: mild distention, RUQ and LUQ TTP, soft, +bs in all four quadrants MSK: FROM in all four extremities Ext: no clubbing, cyanosis, or edema Skin: warm, dry, intact, no rashes  Laboratory: Recent Labs  Lab 08/25/17 1600 08/27/17 2159 08/28/17 0651  WBC 8.5 3.5* 3.6*  HGB 10.9* 9.9*  10.3*  HCT 33.5* 30.1* 31.9*  PLT 126* 132* 109*   Recent Labs  Lab 08/24/17 0042  08/26/17 0857 08/27/17 2159 08/28/17 0651  NA 140   < > 136 137 137  K 5.0   < > 4.7 5.2* 4.1  CL 101   < > 97* 100* 97*  CO2 22   < > 23 21* 25  BUN 75*   < > 47* 78* 32*  CREATININE 9.83*   < > 7.76* 10.32* 6.40*  CALCIUM 8.8*   < > 8.6* 8.1* 8.4*  PROT 7.3  --   --   --  7.1  BILITOT 0.9  --   --   --  1.1  ALKPHOS 124  --   --   --  109  ALT 18  --   --   --  127*  AST 29  --   --   --  91*  GLUCOSE 101*   < > 116* 119* 86   < > = values in this interval not displayed.   2/2 - U/A: Pending 2/3 - Lipase: 88 2/3 - Protime/INR: 15.9/16.8 2/5 - Hep C Panel: HCV Ab <0.1 2/6 - Amylase: pending 2/6 - Albumin: pending 2/6 - Anti-smooth muscle: pending 2/6 - ANA w/reflex: pending 2/6 -  Alpha-1-antitrypsin: pending 2/6 - mitochondrial antibody: pending  Imaging/Diagnostic Tests:  2/3 - CXR:  FINDINGS: Cardiomegaly with pulmonary vascular congestion. Very mild interstitial edema is possible, although this is improved from the prior. Suspected trace bilateral pleural effusions. No pneumothorax.  IMPRESSION: Cardiomegaly with possible mild interstitial edema, although improved from the prior.  Trace bilateral pleural effusions.  2/3 - Abd X-ray: FINDINGS: Nonobstructive bowel gas pattern.  Multiple air-fluid levels on this single upright abdominal radiograph, raising the possibility of small bowel enteritis.  No evidence of free air under the diaphragm on the upright view.  IMPRESSION: No evidence of small bowel obstruction or free air.  Multiple air-fluid levels, raising the possibility of small bowel Enteritis.  2/4 - KUB: IMPRESSION: Oral contrast present for CT. No suspicion of ileus, obstruction or free air.  2/6 - Abd U/S: IMPRESSION: 1. Peripancreatic/left upper quadrant fluid collection as seen on CT, possibly a pseudocyst. 2. Gallbladder wall thickening,  pericholecystic fluid, and positive sonographic Murphy sign. No gallstones. Acalculus cholecystitis is possible in the appropriate clinical setting. 3. Severely atrophic native kidneys.  Failed renal transplant.  2/6 - ECHO: Impressions: - The LV function is markedly reduced. Grade 3 diastolic dysfunction there is a small - moderate sized pericardial effusion but no evidence of RA or RV collapse.  2/7 - HIDA Scan: pending  Nuala Alpha, DO 08/28/2017, 7:19 AM PGY-1, [place] Intern pager: [phone], text pages welcome

## 2017-08-28 NOTE — Progress Notes (Signed)
Progress Note   Subjective  Patient states he feels the same. Tolerating some PO but not much. Had HIDA scan this AM, pending. I spoke with [facility] and he was accepted for transfer.    Objective   Vital signs in last 24 hours: Temp:  [97.6 F (36.4 C)-98 F (36.7 C)] 97.7 F (36.5 C) (02/07 0640) Pulse Rate:  [68-88] 79 (02/07 0640) Resp:  [16-18] 18 (02/07 1152) BP: (134-160)/(65-106) 155/105 (02/07 1152) SpO2:  [93 %-99 %] 99 % (02/07 1152) Weight:  [192 lb 7.4 oz (87.3 kg)] 192 lb 7.4 oz (87.3 kg) (02/06 2130) Last BM Date: 08/25/17 General:    AA male in NAD Heart:  Regular rate and rhythm;  Lungs: Respirations even and unlabored, lungs CTA bilaterally Abdomen:  Soft, mid abdominal TTP, mildly distended.  Extremities:  Without edema. Neurologic:  Alert and oriented,  grossly normal neurologically. Psych:  Cooperative. Normal mood and affect.  Intake/Output from previous day: 02/06 0701 - 02/07 0700 In: 300 [P.O.:300] Out: 4500  Intake/Output this shift: No intake/output data recorded.  Lab Results: Recent Labs    08/25/17 1600 08/27/17 2159 08/28/17 0651  WBC 8.5 3.5* 3.6*  HGB 10.9* 9.9* 10.3*  HCT 33.5* 30.1* 31.9*  PLT 126* 132* 109*   BMET Recent Labs    08/26/17 0857 08/27/17 2159 08/28/17 0651  NA 136 137 137  K 4.7 5.2* 4.1  CL 97* 100* 97*  CO2 23 21* 25  GLUCOSE 116* 119* 86  BUN 47* 78* 32*  CREATININE 7.76* 10.32* 6.40*  CALCIUM 8.6* 8.1* 8.4*   LFT Recent Labs    08/28/17 0651  PROT 7.1  ALBUMIN 2.8*  AST 91*  ALT 127*  ALKPHOS 109  BILITOT 1.1   PT/INR Recent Labs    08/27/17 1035 08/28/17 0651  LABPROT 33.4* 21.9*  INR 3.31 1.93    Studies/Results: US Abdomen Complete  Result Date: 08/27/2017 CLINICAL DATA:  Abdominal distension. History of failed renal transplant. EXAM: ABDOMEN ULTRASOUND COMPLETE COMPARISON:  CT abdomen and pelvis 08/25/2017 FINDINGS: Gallbladder: The gallbladder wall thickening with 6 mm  wall thickness and pericholecystic fluid. No gallstones. There was a positive sonographic Percell Miller sign reported by the sonographer. Common bile duct: Diameter: 5 mm Liver: No focal lesion identified. Within normal limits in parenchymal echogenicity. Portal vein is patent on color Doppler imaging with normal direction of blood flow towards the liver. IVC: No abnormality visualized. Pancreas: Fluid collection along the anterior pancreatic body extending into the left upper quadrant corresponding to that seen on the recent CT and measuring 13+ cm in size. Spleen: Size and appearance within normal limits. Right Kidney: Length: 5.0 cm. Severely echogenic and atrophic without hydronephrosis. Left Kidney: Length: 5.0 cm. Severely echogenic and atrophic without hydronephrosis. Abdominal aorta: No aneurysm visualized. Other findings: 8.2 cm left lower quadrant renal transplant without evidence of internal vascularity on color Doppler imaging. Bilateral pleural effusions. Trace ascites in addition to the peripancreatic/left upper quadrant fluid collection. IMPRESSION: 1. Peripancreatic/left upper quadrant fluid collection as seen on CT, possibly a pseudocyst. 2. Gallbladder wall thickening, pericholecystic fluid, and positive sonographic Murphy sign. No gallstones. Acalculus cholecystitis is possible in the appropriate clinical setting. 3. Severely atrophic native kidneys.  Failed renal transplant. Electronically Signed   By: Logan Bores M.D.   On: 08/27/2017 09:48       Assessment / Plan:   52 y/o male with history of pancreatitis in October / November time frame of  unclear etiology, history of ESRD, history of DVT on coumadin, who presented with worsening abdominal pain.   I have reviewed his imaging studies with radiology. See yesterday's note for full discussion. He has a persistent pseudocyst / peripancreatic fluid collection stemming from prior episode of pancreatitis, which has persisted and likely causing his  symptoms. Options at this point are IR guided drainage versus endoscopic drainage with EUS. Endoscopic drainage is generally preferred due to less risk and better outcomes. I have spoken with [facility], Dr. Francella Solian, who reviewed his case and images and does think he is a good candidate for endoscopic drainage, and accepted the patient.   His echo shows EF of 20-25% which they are aware of, cardiology has not recommended any further pre-procedure evaluation. He is also on coumadin, and recommend this be held for now as he will be having an invasive endoscopic procedure - would place him on Heparin or Lovenox instead. HIDA otherwise pending  I have discussed the plan with the patient at length. Unfortunately [facility] hospital is full right now, they anticipate a bed opening on Sunday / Monday time frame.   Otherwise see prior discussion of liver imaging - not convinced he has cirrhosis, but possible.  Plan: - transfer to [facility] for cyst drainage when bed available, anticipate Sunday or Monday this will occur - please stop Coumadin, manage his DVT with heparin or Lovenox given need to be off all anticoagulation for his EUS guided drainage - await HIDA scan  Please call with questions otherwise, I will follow.  Headland Cellar, MD [facility] Gastroenterology Pager [phone]

## 2017-08-28 NOTE — Progress Notes (Signed)
ANTICOAGULATION CONSULT NOTE - Follow Up Consult  Pharmacy Consult for Coumadin Indication: h/o VTE  Allergies  Allergen Reactions  . Butalbital-Apap-Caffeine Shortness Of Breath, Swelling and Other (See Comments)    Swelling in throat  . Ferrlecit [Na Ferric Gluc Cplx In Sucrose] Shortness Of Breath, Swelling and Other (See Comments)    Swelling in throat, tolerates Venofor  . Minoxidil Shortness Of Breath  . Tylenol [Acetaminophen] Anaphylaxis and Swelling  . Darvocet [Propoxyphene N-Acetaminophen] Hives    Patient Measurements: Height: _0  (188 cm) Weight: 192 lb 7.4 oz (87.3 kg) IBW/kg (Calculated) : 82.2 Heparin Dosing Weight:    Vital Signs: Temp: 97.7 F (36.5 C) (02/07 0640) Temp Source: Oral (02/07 0640) BP: 155/105 (02/07 1152) Pulse Rate: 79 (02/07 0640)  Labs: Recent Labs    08/25/17 1600 08/26/17 0656 08/26/17 0857 08/27/17 1035 08/27/17 2159 08/28/17 0651  HGB 10.9*  --   --   --  9.9* 10.3*  HCT 33.5*  --   --   --  30.1* 31.9*  PLT 126*  --   --   --  132* 109*  LABPROT  --  23.6*  --  33.4*  --  21.9*  INR  --  2.12  --  3.31  --  1.93  CREATININE 12.03*  --  7.76*  --  10.32* 6.40*    Estimated Creatinine Clearance: 15.5 mL/min (A) (by C-G formula based on SCr of 6.4 mg/dL (H)).   Assessment:  Anticoag: On Coumadin for hx of DVTs. INR on admit is low at 1.29>2.12>3.31>1.93 today. Hgb 10.3, plts 142>109 - Coumadin PTA-unsure of home dose (med rec says 7.64m x 2 then see Coumadin pharmacist)  Goal of Therapy:  INR 2-3 Monitor platelets by anticoagulation protocol: Yes   Plan:  Coumadin 694mx 1 Daily PT/INR     Tacy Chavis S. RoAlford HighlandPharmD, BCPS Clinical Staff Pharmacist Pager [phone] Ghazitillinger 08/28/2017,12:02 PM

## 2017-08-28 NOTE — Progress Notes (Signed)
RT set up patient CPAP HS. Patient needs no O2 bleed in. Patient is able to place himself on when he is ready.

## 2017-08-28 NOTE — Progress Notes (Signed)
[facility] Progress Note  Subjective:  Planning transfer to [facility] for endoscopic drainage procedure  Echo with reduced EF 20-25%  HD last night net UF 4.5L    Vitals:   08/28/17 0150 08/28/17 0256 08/28/17 0640 08/28/17 1152  BP: (!) 152/65 (!) 160/99 (!) 142/106 (!) 155/105  Pulse: 84 74 79   Resp:  _0 Temp:  97.7 F (36.5 C) 97.7 F (36.5 C)   TempSrc:  Oral Oral   SpO2:  99% 93% 99%  Weight:      Height:        Inpatient medications: . amitriptyline  10 mg Oral QHS  . amLODipine  10 mg Oral Daily  . atorvastatin  10 mg Oral q1800  . carvedilol  25 mg Oral BID WC  . doxazosin  4 mg Oral QHS  . doxercalciferol  8 mcg Intravenous Q M,W,F-HD  . feeding supplement (NEPRO CARB STEADY)  237 mL Oral TID WC  . gabapentin  100 mg Oral QHS  . pantoprazole  40 mg Oral BID  . warfarin  6 mg Oral ONCE-1800  . Warfarin - Pharmacist Dosing Inpatient   Does not apply q1800   . sodium chloride    . sodium chloride     sodium chloride, sodium chloride, ammonium lactate, heparin, HYDROmorphone (DILAUDID) injection, hydrOXYzine, lidocaine (PF), lidocaine-prilocaine, ondansetron **OR** ondansetron (ZOFRAN) IV, pentafluoroprop-tetrafluoroeth  Exam: General: WDWN male lying in bed NAD Head: NCAT sclera not icteric  Lungs: faint crackles at bases  Heart: RRR with S1 S2 Abdomen: distended +tender to mild palpation  Lower extremities: Trace LE edema bilat  Dialysis Access: LUE AVF +thrill    Dialysis: MWF South 4h   78kg   2/2.25 bath  Hep none  L AVF Hectorol 11mg IV TIW Venofer 540mIV q week Mircera 15021mq 2 weeks (last 1/25)      Impression:  1  Abd pain / recur pancreatitis/Ab normal liver imaging  -  Loculated pseudocyst/ peripancreatic fluid collection - transfer to [facility] for endoscopic drainage  2 Reduced EF 20-25% on Echo/xm pericardial effusion  2  ESRD HD mwf 3  Vol overload 4  Anemia Hb 10, follow 5  HTN vol up 6  Hx DVT on  coumadin   [facility] - [facility]   Frank [name] CarCalabashger [id number]/2019,12:21 PM    Recent Labs  Lab 08/26/17 0857 08/27/17 2159 08/28/17 0651  NA 136 137 137  K 4.7 5.2* 4.1  CL 97* 100* 97*  CO2 23 21* 25  GLUCOSE 116* 119* 86  BUN 47* 78* 32*  CREATININE 7.76* 10.32* 6.40*  CALCIUM 8.6* 8.1* 8.4*  PHOS  --  6.6*  --    Recent Labs  Lab 08/24/17 0042 08/27/17 2159 08/28/17 0651  AST 29  --  91*  ALT 18  --  127*  ALKPHOS 124  --  109  BILITOT 0.9  --  1.1  PROT 7.3  --  7.1  ALBUMIN 3.0* 2.7* 2.8*   Recent Labs  Lab 08/25/17 1600 08/27/17 2159 08/28/17 0651  WBC 8.5 3.5* 3.6*  HGB 10.9* 9.9* 10.3*  HCT 33.5* 30.1* 31.9*  MCV 89.1 86.7 87.9  PLT 126* 132* 109*   Iron/TIBC/Ferritin/ %Sat    Component Value Date/Time   IRON 41 (L) 05/29/2016 1350   TIBC 190 (L) 05/29/2016 1350   FERRITIN 328 11/02/2015 0346   IRONPCTSAT  22 05/29/2016 1350

## 2017-08-28 NOTE — Progress Notes (Signed)
[facility] Progress Note      Assessment/Plan Active Problems:   Acute on chronic pancreatitis [facility])   Pancreatic pseudocyst  Abdominal Pain - HIDA pending to rule out cholecystitis. - ascites, may have underlying cirrhosis - we will follow - Pt being transferred to tertiary center for management of pancreatic cyst??  FEN: renal diet after HIDA VTE: per medicine ID: none Foley: none Follow up: TBD   DISPO: HIDA pending     LOS: 4 days    Subjective: CC; abdominal pain  Pt states he has been dealing with this pain for months. He states after he eats his belly gets bloated and has increase pain. He states his brother had to have fluid drawn off his belly on a regular basis. He is asking if the procedure to pull fluid off his pancreas will be a one time thing or happen often. I told him I did not know.   Objective: Vital signs in last 24 hours: Temp:  [97.6 F (36.4 C)-98 F (36.7 C)] 97.7 F (36.5 C) (02/07 0640) Pulse Rate:  [68-88] 79 (02/07 0640) Resp:  [16-18] 18 (02/07 0640) BP: (134-160)/(65-106) 142/106 (02/07 0640) SpO2:  [93 %-99 %] 93 % (02/07 0640) Weight:  [192 lb 7.4 oz (87.3 kg)] 192 lb 7.4 oz (87.3 kg) (02/06 2130) Last BM Date: 08/25/17  Intake/Output from previous day: 02/06 0701 - 02/07 0700 In: 300 [P.O.:300] Out: 4500  Intake/Output this shift: No intake/output data recorded.  PE: Gen:  Alert, NAD, pleasant, cooperative Pulm:  Rate and effort normal Abd: Soft, distended, +BS, + hepatomegaly, moderate generalized TTP without guarding Skin: no rashes noted, warm and dry   Anti-infectives: Anti-infectives (From admission, onward)   None      Lab Results:  Recent Labs    08/27/17 2159 08/28/17 0651  WBC 3.5* 3.6*  HGB 9.9* 10.3*  HCT 30.1* 31.9*  PLT 132* PENDING   BMET Recent Labs    08/27/17 2159 08/28/17 0651  NA 137 137  K 5.2* 4.1  CL 100* 97*  CO2 21* 25  GLUCOSE 119* 86  BUN 78* 32*   CREATININE 10.32* 6.40*  CALCIUM 8.1* 8.4*   PT/INR Recent Labs    08/27/17 1035 08/28/17 0651  LABPROT 33.4* 21.9*  INR 3.31 1.93   CMP     Component Value Date/Time   NA 137 08/28/2017 0651   K 4.1 08/28/2017 0651   CL 97 (L) 08/28/2017 0651   CO2 25 08/28/2017 0651   GLUCOSE 86 08/28/2017 0651   BUN 32 (H) 08/28/2017 0651   CREATININE 6.40 (H) 08/28/2017 0651   CALCIUM 8.4 (L) 08/28/2017 0651   PROT 7.1 08/28/2017 0651   ALBUMIN 2.8 (L) 08/28/2017 0651   AST 91 (H) 08/28/2017 0651   ALT 127 (H) 08/28/2017 0651   ALKPHOS 109 08/28/2017 0651   BILITOT 1.1 08/28/2017 0651   GFRNONAA 9 (L) 08/28/2017 0651   GFRAA 10 (L) 08/28/2017 0651   Lipase     Component Value Date/Time   LIPASE 88 (H) 08/24/2017 0042    Studies/Results: US Abdomen Complete  Result Date: 08/27/2017 CLINICAL DATA:  Abdominal distension. History of failed renal transplant. EXAM: ABDOMEN ULTRASOUND COMPLETE COMPARISON:  CT abdomen and pelvis 08/25/2017 FINDINGS: Gallbladder: The gallbladder wall thickening with 6 mm wall thickness and pericholecystic fluid. No gallstones. There was a positive sonographic Percell Miller sign reported by the sonographer. Common bile duct: Diameter: 5 mm Liver: No focal lesion identified. Within normal limits in  parenchymal echogenicity. Portal vein is patent on color Doppler imaging with normal direction of blood flow towards the liver. IVC: No abnormality visualized. Pancreas: Fluid collection along the anterior pancreatic body extending into the left upper quadrant corresponding to that seen on the recent CT and measuring 13+ cm in size. Spleen: Size and appearance within normal limits. Right Kidney: Length: 5.0 cm. Severely echogenic and atrophic without hydronephrosis. Left Kidney: Length: 5.0 cm. Severely echogenic and atrophic without hydronephrosis. Abdominal aorta: No aneurysm visualized. Other findings: 8.2 cm left lower quadrant renal transplant without evidence of internal  vascularity on color Doppler imaging. Bilateral pleural effusions. Trace ascites in addition to the peripancreatic/left upper quadrant fluid collection. IMPRESSION: 1. Peripancreatic/left upper quadrant fluid collection as seen on CT, possibly a pseudocyst. 2. Gallbladder wall thickening, pericholecystic fluid, and positive sonographic Murphy sign. No gallstones. Acalculus cholecystitis is possible in the appropriate clinical setting. 3. Severely atrophic native kidneys.  Failed renal transplant. Electronically Signed   By: Logan Bores M.D.   On: 08/27/2017 09:48      Kalman Drape , [facility] 08/28/2017, 8:36 AM Pager: [phone] Consults: [phone] Mon-Fri 7:00 am-4:30 pm Sat-Sun 7:00 am-11:30 am

## 2017-08-28 NOTE — Progress Notes (Signed)
Got a call back from [facility](transfer Unit), Said the there is no bed available for Pt tonight, and not sure if there is one tomorrow. Said the pt will not absolutely be in any kind of procedure in [place].

## 2017-08-28 NOTE — Progress Notes (Signed)
GI UPDATE  This patient has a large peripancreatic fluid collection which has persisted over time and I suspect the cause of his abdominal pain. I think he is a good candidate for endoscopic drainage, but that is not able to be performed in [place]. I have reached out to [facility] to see if they will accept him as an inpatient transfer for drainage, I am waiting to hear back from them this morning. The procedure will not be occurring today, I am hoping they will accept him for transfer however at some point in time. I will update the patient when I hear more, he is out of his room when on rounds today. Appreciate cardiology input, pericardial effusion not thought to be hemodynamically significant.  [place] Cellar, MD [facility] Gastroenterology Pager [phone]

## 2017-08-28 NOTE — Progress Notes (Signed)
Per [facility], when the Pt is to be transferred to Make him NPO the night before the procedure. I spoke to Ebony Hail, Camera operator, and gave report to her for Patient.  She advised that the MD has accepted the Pt, and was scheduling the procedure for Friday 08/29/2017.  They did not have a bed for the patient at this time, but once one came available, they would let me know and then arrangement would be made to have him transferred to [facility].

## 2017-08-29 ENCOUNTER — Other Ambulatory Visit: Payer: Self-pay

## 2017-08-29 DIAGNOSIS — E8770 Fluid overload, unspecified: Secondary | ICD-10-CM

## 2017-08-29 LAB — CBC
HCT: 33.3 % — ABNORMAL LOW (ref 39.0–52.0)
HEMOGLOBIN: 10.7 g/dL — AB (ref 13.0–17.0)
MCH: 28.8 pg (ref 26.0–34.0)
MCHC: 32.1 g/dL (ref 30.0–36.0)
MCV: 89.5 fL (ref 78.0–100.0)
Platelets: 124 10*3/uL — ABNORMAL LOW (ref 150–400)
RBC: 3.72 MIL/uL — ABNORMAL LOW (ref 4.22–5.81)
RDW: 19.3 % — ABNORMAL HIGH (ref 11.5–15.5)
WBC: 3 10*3/uL — AB (ref 4.0–10.5)

## 2017-08-29 LAB — BASIC METABOLIC PANEL
Anion gap: 17 — ABNORMAL HIGH (ref 5–15)
BUN: 52 mg/dL — ABNORMAL HIGH (ref 6–20)
CALCIUM: 8.7 mg/dL — AB (ref 8.9–10.3)
CO2: 23 mmol/L (ref 22–32)
Chloride: 97 mmol/L — ABNORMAL LOW (ref 101–111)
Creatinine, Ser: 8.58 mg/dL — ABNORMAL HIGH (ref 0.61–1.24)
GFR, EST AFRICAN AMERICAN: 7 mL/min — AB (ref >60)
GFR, EST NON AFRICAN AMERICAN: 6 mL/min — AB (ref >60)
Glucose, Bld: 98 mg/dL (ref 65–99)
Potassium: 4.9 mmol/L (ref 3.5–5.1)
Sodium: 137 mmol/L (ref 135–145)

## 2017-08-29 LAB — ALPHA-1-ANTITRYPSIN: A1 ANTITRYPSIN SER: 144 mg/dL (ref 90–200)

## 2017-08-29 LAB — PROTIME-INR
INR: 1.67
PROTHROMBIN TIME: 19.6 s — AB (ref 11.4–15.2)

## 2017-08-29 MED ORDER — DOXERCALCIFEROL 4 MCG/2ML IV SOLN
INTRAVENOUS | Status: AC
  Administered 2017-08-29: 8 ug via INTRAVENOUS
  Filled 2017-08-29: qty 4

## 2017-08-29 NOTE — Progress Notes (Addendum)
[facility] Progress Note  Subjective:  Planning transfer to [facility] for endoscopic drainage procedure  No bed available yet Echo with reduced EF 20-25%  Says his dry wt was raised 8-9 kg recently and he is now feeling "swollen up all the time", wants to know if dry wt can be lowered some   Vitals:   08/28/17 1843 08/28/17 2048 08/28/17 2216 08/29/17 0637  BP: (!) 137/93  (!) 131/93 (!) 136/92  Pulse: 73 78 71 70  Resp:  _0 Temp:   97.8 F (36.6 C) 97.6 F (36.4 C)  TempSrc:   Oral Axillary  SpO2:  98% 93% 95%  Weight:      Height:        Inpatient medications: . amitriptyline  10 mg Oral QHS  . amLODipine  10 mg Oral Daily  . atorvastatin  10 mg Oral q1800  . carvedilol  25 mg Oral BID WC  . doxazosin  4 mg Oral QHS  . doxercalciferol  8 mcg Intravenous Q M,W,F-HD  . feeding supplement (NEPRO CARB STEADY)  237 mL Oral TID WC  . gabapentin  100 mg Oral QHS  . heparin injection (subcutaneous)  5,000 Units Subcutaneous Q8H  . pantoprazole  40 mg Oral BID   . sodium chloride    . sodium chloride     sodium chloride, sodium chloride, ammonium lactate, heparin, HYDROmorphone (DILAUDID) injection, hydrOXYzine, lidocaine (PF), lidocaine-prilocaine, ondansetron **OR** ondansetron (ZOFRAN) IV, pentafluoroprop-tetrafluoroeth  Exam: General: WDWN male +face is puffy,  3+ JVD Head: NCAT sclera not icteric  Lungs: faint crackles at bases  Heart: RRR with S1 S2 Abdomen: distended +tender to mild palpation  Lower extremities: mild LE edema bilat  Dialysis Access: LUE AVF +thrill    Dialysis: MWF South 4h   78kg   2/2.25 bath  Hep none  L AVF Hectorol 37mg IV TIW Venofer 597mIV q week Mircera 15066mq 2 weeks (last 1/25)      Impression:  1  Abd pain / recur pancreatitis/Ab normal liver imaging  -  Loculated pseudocyst/ peripancreatic fluid collection - planning transfer to [facility] for endoscopic drainage  2 Reduced EF 20-25% on Echo/xm pericardial  effusion  2  ESRD HD MWF.  3  Vol overload- recently dry wt raised significantly per pt, will do extra HD tomorrow to lower vol as tolerated, looks overloaded on exam 4  Anemia Hb 10, follow 5  HTN  6  Hx DVT on coumadin   Plan - as above   OgeLynnda Child-C [facility] Pager [phone]/2019,10:07 AM    Recent Labs  Lab 08/27/17 2159 08/28/17 0651 08/29/17 0742  NA 137 137 137  K 5.2* 4.1 4.9  CL 100* 97* 97*  CO2 21* 25 23  GLUCOSE 119* 86 98  BUN 78* 32* 52*  CREATININE 10.32* 6.40* 8.58*  CALCIUM 8.1* 8.4* 8.7*  PHOS 6.6*  --   --    Recent Labs  Lab 08/24/17 0042 08/27/17 2159 08/28/17 0651  AST 29  --  91*  ALT 18  --  127*  ALKPHOS 124  --  109  BILITOT 0.9  --  1.1  PROT 7.3  --  7.1  ALBUMIN 3.0* 2.7* 2.8*   Recent Labs  Lab 08/27/17 2159 08/28/17 0651 08/29/17 0742  WBC 3.5* 3.6* 3.0*  HGB 9.9* 10.3* 10.7*  HCT 30.1* 31.9* 33.3*  MCV 86.7 87.9 89.5  PLT 132* 109* 124*  Iron/TIBC/Ferritin/ %Sat    Component Value Date/Time   IRON 41 (L) 05/29/2016 1350   TIBC 190 (L) 05/29/2016 1350   FERRITIN 328 11/02/2015 0346   IRONPCTSAT 22 05/29/2016 1350

## 2017-08-29 NOTE — Progress Notes (Signed)
[facility] Progress Note      Assessment/Plan Active Problems:   Acute on chronic pancreatitis [facility])   Pancreatic pseudocyst  Abdominal Pain - HIDA negative - ascites, may have underlying cirrhosis - Pt being transferred to tertiary center for management of pancreatic cyst??  FEN: renal diet  VTE: per medicine ID: none Foley: none Follow up: none   DISPO: HIDA was negative. No surgical needs at this time. Surgery will sign off. Please page as needed.     LOS: 5 days    Subjective:  CC: abdominal pain  Pain unchanged. Fullness with food unchanged. No new complaints. Discussed results of HIDA with pt.   Objective: Vital signs in last 24 hours: Temp:  [97.6 F (36.4 C)-99.1 F (37.3 C)] 97.6 F (36.4 C) (02/08 0637) Pulse Rate:  [70-78] 70 (02/08 0637) Resp:  [18-19] 18 (02/08 0637) BP: (131-155)/(92-105) 136/92 (02/08 0637) SpO2:  [93 %-99 %] 95 % (02/08 0637) Last BM Date: 08/27/17  Intake/Output from previous day: 02/07 0701 - 02/08 0700 In: 480 [P.O.:480] Out: -  Intake/Output this shift: No intake/output data recorded.  PE: Gen:  Alert, NAD, pleasant, cooperative Pulm:  Rate and effort normal Abd: Soft, distended, +BS, + hepatomegaly, moderate generalized TTP without guarding Skin: no rashes noted, warm and dry     Anti-infectives: Anti-infectives (From admission, onward)   None      Lab Results:  Recent Labs    08/28/17 0651 08/29/17 0742  WBC 3.6* 3.0*  HGB 10.3* 10.7*  HCT 31.9* 33.3*  PLT 109* 124*   BMET Recent Labs    08/28/17 0651 08/29/17 0742  NA 137 137  K 4.1 4.9  CL 97* 97*  CO2 25 23  GLUCOSE 86 98  BUN 32* 52*  CREATININE 6.40* 8.58*  CALCIUM 8.4* 8.7*   PT/INR Recent Labs    08/28/17 0651 08/29/17 0742  LABPROT 21.9* 19.6*  INR 1.93 1.67   CMP     Component Value Date/Time   NA 137 08/29/2017 0742   K 4.9 08/29/2017 0742   CL 97 (L) 08/29/2017 0742   CO2 23 08/29/2017 0742    GLUCOSE 98 08/29/2017 0742   BUN 52 (H) 08/29/2017 0742   CREATININE 8.58 (H) 08/29/2017 0742   CALCIUM 8.7 (L) 08/29/2017 0742   PROT 7.1 08/28/2017 0651   ALBUMIN 2.8 (L) 08/28/2017 0651   AST 91 (H) 08/28/2017 0651   ALT 127 (H) 08/28/2017 0651   ALKPHOS 109 08/28/2017 0651   BILITOT 1.1 08/28/2017 0651   GFRNONAA 6 (L) 08/29/2017 0742   GFRAA 7 (L) 08/29/2017 0742   Lipase     Component Value Date/Time   LIPASE 88 (H) 08/24/2017 0042    Studies/Results: Nm Hepato W/eject Fract  Result Date: 08/28/2017 CLINICAL DATA:  Abdominal pain and pancreatitis EXAM: NUCLEAR MEDICINE HEPATOBILIARY IMAGING WITH GALLBLADDER EF VIEWS: Anterior, right lateral right upper quadrant RADIOPHARMACEUTICALS:  5.15 mCi Tc-57m Choletec IV COMPARISON:  None. FINDINGS: Liver uptake of radiotracer is normal. There is prompt visualization of gallbladder and small bowel, indicating patency of the cystic and common bile ducts. The patient consumed 8 ounces of Ensure orally with calculation of the computer generated ejection fraction of radiotracer from the gallbladder. No report of clinical symptoms with the oral Ensure consumption. The computer generated ejection fraction of radiotracer from the gallbladder is normal at 44%, normal greater than 33% using the oral agent. IMPRESSION: Study within normal limits. Electronically Signed   By: WGwyndolyn Saxon  Jasmine December III M.D.   On: 08/28/2017 12:46      Kalman Drape , [facility] 08/29/2017, 9:02 AM Pager: [phone] Consults: [phone] Mon-Fri 7:00 am-4:30 pm Sat-Sun 7:00 am-11:30 am

## 2017-08-29 NOTE — Progress Notes (Signed)
[name]: [phone]  Patient name: Frank [name] Medical record number: [id number] Date of birth: Nov 24, 1965 Age: 52 y.o. Gender: male  Primary Care Provider: No primary care provider on file. Consultants: Nephrology Code Status: Full  Pt Overview and Major Events to Date:  Frank [name] is a 52 y.o. male presenting with abdominal pain thought to be secondary to acute on chronic pancreatitis. PMH is significant for PMH is significant forESRD on MWF HD, HTN, HFrEF, NICM,DVT/PE on Coumadin, anxiety and depression, chronic pain syndrome, anemia of chronic disease, GERD, chronic pancreatitis, HLD, OSA.  Assessment and Plan:  Abdominal Pain: Acute on Chronic. After extensive work up patient is likely having pain from Abdominal x-ray showed no evidence of small bowel obstruction or free air but did show air gastric levels consistent with enteritis. CT Abd showed no acute bowel pathology. Given distention and abdominal pain with new finding of Cirrhosis concern Spoke with IR to see if paracentesis could be performed and IR said no ascites present. They would not recommend percutaneous drainage of pancreatic pseudocyst that has enlarged since December and could be a cause for his current pain. Patient is being transferred to [facility] today for EUS procedure.  - Gastroenterology following; appreciate recs - Transfer to [place] likely Monday to have EUS to draine pancreatic pseudocyst. Transferring to [facility] as no one in [place] performs this procedure. - Zofran prn for nausea -  Diet as tolerated, soft solids transition to reg diet as tolerated - Dilaudid 52m q4, Tylenol prn for pain control - Cont Protonix 430mBID - Surgery consult for concern of acalculous cholecystitis seen on Abd U/S,  HIDA scan was normal with EF of 44% Surgery has signed off.  HFrEF / NICM: Clinically unchanged but ECHO results show acute changes. ECHO 08/27/2017 shows EF 20-25%,  global hypokinesis, severe LAE, mild mitral regurg. Does not appear volume overloaded. Patient presented with symptoms of SOB. Questionable PND vs sleep apnea at home. Does have Cardiomegaly. CT Abd did show enlarging pericardial effusion with right pleural effusion and dependent atelectasis in the right lung. - Will forward patient info [facility] will coordinate outpatient care and follow up for worsening CHF. - Cont Coreg 2570mid and Imdur 24m8mily - Fluid status management with HD - Patient cleared for EUS procedure from Cardiac standpoint - Will need outpatient follow up for his HF - continue to monitor O2 sats  Possible Cirrohsis: Acute. Patient does not endorse history of ETOH consumption. Hepatitis C panel negative. In light of pancreatic pseudocyst the diagnosis of cirrhosis is questionable. - GI on board; appreciate recs. Actual diagnosis of cirrhosis may have been premature. As it looks like his worsening pancreatic pseuodcyst is a much better explanation for his current symptoms. See note on abdominal pain.  ESRD on MWF HD: [facility] Has not missed any HD sessions recently per patient. K 5.0 and patient does not appear volume overloaded - Could consider restarting Renvela 2,400mg2m defer to Nephro - Nephro managing HD; greatly appreciate their recs and management for this patient  HTN: Chronic. Improving with resuming home meds and able to take them regularly. BP has been midly elevated but patient has had a lot of pain. SBP 131-155, DBP 93-105.  Likely elevated due to pain and not being able to keep down BP meds at home.  - Cont home Norvasc 10mg 18my, Coreg 25mg b24mDoxazosin  101m qhs - Consider restarting Hydralazine 1024mdaily if BP remains elevated - Likely need to switch home Hydralazine to tid dosing on discharge home  Hx DVT/PE: Has had multiple DVTs in the past. On Coumadin at home. - Coumadin stopped per GI  due to pending EUS procedure.  - Heparin per pharm  Anxiety/depression: Chronic, stable. Denies any feelings of depression on admission. - Cont Amitriptyline  Chronic Pain Syndrome: Chronic. - cont Amitriptyline 1061mnd Gabapentin 100m10mnemia of Chronic Disease: Likely related to CKD. Hgb 10.7 on admission now at 9.9 (baseline ~10) - cont to trend Hgb  HLD: Last lipid panel 08/09/17 with Chol 92, HDL 24, LDL 53, and TG 77. - Cont home Lipitor 10mg70mA: Chronic. Stable. - CPAP qhs  GERD: Chronic. Stable. - Restart home Protonix 40mg 57muntil able to take PO  FEN/GI: Advance diet as tolerated PPx: Coumadin for DVT/PD  Disposition: Discharge home  Subjective:  Today patient states he feels well and his pain is well controlled. He is optimistic that the upcoming procedure well help his abdominal symptoms. No complaints except he didn't sleep that well last night.  Objective: Temp:  [97.6 F (36.4 C)-99.1 F (37.3 C)] 97.6 F (36.4 C) (02/08 0637) Pulse Rate:  [70-78] 70 (02/08 0637) Resp:  [18-19] 18 (02/08 0637) BP: (131-155)/(92-105) 136/92 (02/08 0637) SpO2:  [93 %-99 %] 95 % (02/08 0637) 7494ical Exam:  Gen: Alert and Oriented x 3, NAD HEENT: Normocephalic, atraumatic, PERRLA, EOMI CV: RRR, no murmurs, normal S1, S2 split, +2 pulses dorsalis pedis bilaterally Resp: CTAB, no wheezing, rales, or rhonchi, comfortable work of breathing Abd: mild distended, mildly tender in all quadrants, soft, +bs in all four quadrants MSK: FROM in all four extremities Ext: no clubbing, cyanosis, or edema Skin: warm, dry, intact, no rashes  Laboratory: Recent Labs  Lab 08/27/17 2159 08/28/17 0651 08/29/17 0742  WBC 3.5* 3.6* 3.0*  HGB 9.9* 10.3* 10.7*  HCT 30.1* 31.9* 33.3*  PLT 132* 109* 124*   Recent Labs  Lab 08/24/17 0042  08/27/17 2159 08/28/17 0651 08/29/17 0742  NA 140   < > 137 137 137  K 5.0   < > 5.2* 4.1 4.9  CL 101   < > 100* 97* 97*  CO2 22   < >  21* 25 23  BUN 75*   < > 78* 32* 52*  CREATININE 9.83*   < > 10.32* 6.40* 8.58*  CALCIUM 8.8*   < > 8.1* 8.4* 8.7*  PROT 7.3  --   --  7.1  --   BILITOT 0.9  --   --  1.1  --   ALKPHOS 124  --   --  109  --   ALT 18  --   --  127*  --   AST 29  --   --  91*  --   GLUCOSE 101*   < > 119* 86 98   < > = values in this interval not displayed.   2/2 - U/A: Pending 2/3 - Lipase: 88 2/3 - Protime/INR: 15.9/16.8 2/5 - Hep C Panel: HCV Ab <0.1 2/6 - Amylase: pending 2/6 - Albumin: pending 2/6 - Anti-smooth muscle: pending 2/6 - ANA w/reflex: pending 2/6 - Alpha-1-antitrypsin: pending 2/6 - mitochondrial antibody: pending  Imaging/Diagnostic Tests:  2/3 - CXR:  FINDINGS: Cardiomegaly with pulmonary vascular congestion. Very mild interstitial edema is possible, although this is improved from the prior. Suspected trace bilateral pleural effusions. No pneumothorax.  IMPRESSION: Cardiomegaly with possible mild interstitial edema, although improved from the prior.  Trace bilateral pleural effusions.  2/3 - Abd X-ray: FINDINGS: Nonobstructive bowel gas pattern.  Multiple air-fluid levels on this single upright abdominal radiograph, raising the possibility of small bowel enteritis.  No evidence of free air under the diaphragm on the upright view.  IMPRESSION: No evidence of small bowel obstruction or free air.  Multiple air-fluid levels, raising the possibility of small bowel Enteritis.  2/4 - KUB: IMPRESSION: Oral contrast present for CT. No suspicion of ileus, obstruction or free air.  2/6 - Abd U/S: IMPRESSION: 1. Peripancreatic/left upper quadrant fluid collection as seen on CT, possibly a pseudocyst. 2. Gallbladder wall thickening, pericholecystic fluid, and positive sonographic Murphy sign. No gallstones. Acalculus cholecystitis is possible in the appropriate clinical setting. 3. Severely atrophic native kidneys.  Failed renal transplant.  2/6 -  ECHO: Impressions: - The LV function is markedly reduced. Grade 3 diastolic dysfunction there is a small - moderate sized pericardial effusion but no evidence of RA or RV collapse.  2/7 - HIDA Scan: pending  Nuala Alpha, DO 08/29/2017, 7:26 AM PGY-1, [place] Intern pager: [phone], text pages welcome

## 2017-08-29 NOTE — Care Management Important Message (Signed)
Important Message  Patient Details  Name: Frank [name] MRN: [id number] Date of Birth: 03-23-1966   Medicare Important Message Given:  Yes    [name] 08/29/2017, 8:52 AM

## 2017-08-29 NOTE — Progress Notes (Signed)
1207 Patient taken down to dialysis.

## 2017-08-29 NOTE — Progress Notes (Signed)
Daily Rounding Note  08/29/2017, 11:18 AM  LOS: 5 days   SUBJECTIVE:   Chief complaint: abdominal pain, Dilaudid is helping.  Wants to have a BM.  Tolerating soft diet, consuming 50 to 100% of meals.        OBJECTIVE:         Vital signs in last 24 hours:    Temp:  [97.6 F (36.4 C)-99.1 F (37.3 C)] 97.6 F (36.4 C) (02/08 0637) Pulse Rate:  [70-78] 70 (02/08 0637) Resp:  [18-19] 18 (02/08 0637) BP: (131-155)/(92-105) 136/92 (02/08 0637) SpO2:  [93 %-99 %] 95 % (02/08 0637) Last BM Date: 08/27/17 Filed Weights   08/25/17 1450 08/25/17 1909 08/27/17 2130  Weight: 88.3 kg (194 lb 10.7 oz) 83.8 kg (184 lb 11.9 oz) 87.3 kg (192 lb 7.4 oz)   General: in better spirits.  More comfortable   Heart: RRR Chest: clear bil.   Abdomen: soft, BS hypoactive.  Tender diffusely > upper abdomen, did not palpate with a lot of pressure  Extremities: no CCE Neuro/Psych:  In better spirits.  Fully alert and oriented.    Intake/Output from previous day: 02/07 0701 - 02/08 0700 In: 480 [P.O.:480] Out: -   Intake/Output this shift: Total I/O In: 240 [P.O.:240] Out: -   Lab Results: Recent Labs    08/27/17 2159 08/28/17 0651 08/29/17 0742  WBC 3.5* 3.6* 3.0*  HGB 9.9* 10.3* 10.7*  HCT 30.1* 31.9* 33.3*  PLT 132* 109* 124*   BMET Recent Labs    08/27/17 2159 08/28/17 0651 08/29/17 0742  NA 137 137 137  K 5.2* 4.1 4.9  CL 100* 97* 97*  CO2 21* 25 23  GLUCOSE 119* 86 98  BUN 78* 32* 52*  CREATININE 10.32* 6.40* 8.58*  CALCIUM 8.1* 8.4* 8.7*   LFT Recent Labs    08/27/17 2159 08/28/17 0651  PROT  --  7.1  ALBUMIN 2.7* 2.8*  AST  --  91*  ALT  --  127*  ALKPHOS  --  109  BILITOT  --  1.1   PT/INR Recent Labs    08/28/17 0651 08/29/17 0742  LABPROT 21.9* 19.6*  INR 1.93 1.67   Hepatitis Panel Recent Labs    08/26/17 1416  HEPBSAG Negative  HCVAB <0.1  HEPAIGM Negative  HEPBIGM Negative     Studies/Results: Nm Hepato W/eject Fract  Result Date: 08/28/2017 CLINICAL DATA:  Abdominal pain and pancreatitis EXAM: NUCLEAR MEDICINE HEPATOBILIARY IMAGING WITH GALLBLADDER EF VIEWS: Anterior, right lateral right upper quadrant RADIOPHARMACEUTICALS:  5.15 mCi Tc-46m Choletec IV COMPARISON:  None. FINDINGS: Liver uptake of radiotracer is normal. There is prompt visualization of gallbladder and small bowel, indicating patency of the cystic and common bile ducts. The patient consumed 8 ounces of Ensure orally with calculation of the computer generated ejection fraction of radiotracer from the gallbladder. No report of clinical symptoms with the oral Ensure consumption. The computer generated ejection fraction of radiotracer from the gallbladder is normal at 44%, normal greater than 33% using the oral agent. IMPRESSION: Study within normal limits. Electronically Signed   By: WLowella GripIII M.D.   On: 08/28/2017 12:46   Scheduled Meds: . amitriptyline  10 mg Oral QHS  . amLODipine  10 mg Oral Daily  . atorvastatin  10 mg Oral q1800  . carvedilol  25 mg Oral BID WC  . doxazosin  4 mg Oral QHS  . doxercalciferol  8 mcg Intravenous  Q M,W,F-HD  . feeding supplement (NEPRO CARB STEADY)  237 mL Oral TID WC  . gabapentin  100 mg Oral QHS  . heparin injection (subcutaneous)  5,000 Units Subcutaneous Q8H  . pantoprazole  40 mg Oral BID   Continuous Infusions: . sodium chloride    . sodium chloride     PRN Meds:.sodium chloride, sodium chloride, ammonium lactate, heparin, HYDROmorphone (DILAUDID) injection, hydrOXYzine, lidocaine (PF), lidocaine-prilocaine, ondansetron **OR** ondansetron (ZOFRAN) IV, pentafluoroprop-tetrafluoroeth  ASSESMENT:   *   Pancreatitis with pseudocyst/ peripancreatic fluid collection.  Ongoing abd pain.  HIDA scan WNL.    *   Pericardial effusion, EF 20-25%.    *  Hx DVT.  Chronic coumadin on hold, INR subtherapeutic.  SQ Heparin in place.      *   ESRD HD  mwf. HD today.  Chronic volume overload.    *   Anemia.  On Venofer and Mircera overseen by Renal MDs.    PLAN   *  Await transfer to [facility] (Dr Francella Solian) for cyst drainage.  Anticipate bed availability and transfer 2/9 or 2/10.  At this point issue is supportive care, pain mgt.   We will sign off, call if you need [place] back but supportive care and pain mgt can be overseen by teaching service.  *  Add renal restrictions to soft diet.     Azucena Freed  08/29/2017, 11:18 AM Pager: [phone]

## 2017-08-30 LAB — BASIC METABOLIC PANEL
Anion gap: 14 (ref 5–15)
BUN: 34 mg/dL — AB (ref 6–20)
CHLORIDE: 98 mmol/L — AB (ref 101–111)
CO2: 25 mmol/L (ref 22–32)
Calcium: 8.8 mg/dL — ABNORMAL LOW (ref 8.9–10.3)
Creatinine, Ser: 6.19 mg/dL — ABNORMAL HIGH (ref 0.61–1.24)
GFR calc non Af Amer: 9 mL/min — ABNORMAL LOW (ref >60)
GFR, EST AFRICAN AMERICAN: 11 mL/min — AB (ref >60)
Glucose, Bld: 87 mg/dL (ref 65–99)
POTASSIUM: 4.5 mmol/L (ref 3.5–5.1)
SODIUM: 137 mmol/L (ref 135–145)

## 2017-08-30 LAB — PROTIME-INR
INR: 1.59
Prothrombin Time: 18.8 seconds — ABNORMAL HIGH (ref 11.4–15.2)

## 2017-08-30 LAB — MITOCHONDRIAL ANTIBODIES: Mitochondrial M2 Ab, IgG: <20 Units (ref 0.0–20.0)

## 2017-08-30 LAB — ANTI-SMOOTH MUSCLE ANTIBODY, IGG: F-Actin IgG: 17 Units (ref 0–19)

## 2017-08-30 MED ORDER — HYDROMORPHONE HCL 1 MG/ML IJ SOLN
1.0000 mg | INTRAMUSCULAR | Status: DC | PRN
  Administered 2017-08-30 – 2017-09-04 (×39): 1 mg via INTRAVENOUS
  Filled 2017-08-30 (×37): qty 1

## 2017-08-30 MED ORDER — HYDROMORPHONE HCL 1 MG/ML IJ SOLN
INTRAMUSCULAR | Status: AC
  Administered 2017-08-30: 1 mg via INTRAVENOUS
  Filled 2017-08-30: qty 1

## 2017-08-30 NOTE — Progress Notes (Signed)
Patient arrived to unit per bed.  Reviewed treatment plan and this RN agrees.  Report received from bedside RN, Joann.  Consent verified.  Patient A & o X 4. Lung sounds diminished to ausculation in all fields. No edema. Cardiac: NSR.  Prepped LLAVF with alcohol and cannulated with two 15 gauge needles.  Pulsation of blood noted.  Flushed access well with saline per protocol.  Connected and secured lines and initiated tx at 0940.  UF goal of 5500 mL and net fluid removal of 5000 mL.  Will continue to monitor.

## 2017-08-30 NOTE — Progress Notes (Signed)
Dialysis treatment completed.  5500 mL ultrafiltrated and net fluid removal 5000 mL.    Patient status unchanged. Lung sounds clear to ausculation in all fields. No edema. Cardiac: NSR.  Disconnected lines and removed needles.  Pressure held for 15 minutes and band aid/gauze dressing applied.  Report given to bedside RN, Joann.

## 2017-08-30 NOTE — Progress Notes (Addendum)
[facility] Progress Note  Subjective:  Planning transfer to [facility] when bed available for endoscopic drainage procedure  No bed available yet Echo with reduced EF 20-25%   Vitals:   08/30/17 0940 08/30/17 1000 08/30/17 1030 08/30/17 1100  BP: 126/86 123/81 119/83 99/84  Pulse: 70 70 63 (!) 56  Resp:      Temp:      TempSrc:      SpO2:      Weight:      Height:        Inpatient medications: . amitriptyline  10 mg Oral QHS  . amLODipine  10 mg Oral Daily  . atorvastatin  10 mg Oral q1800  . carvedilol  25 mg Oral BID WC  . doxazosin  4 mg Oral QHS  . doxercalciferol  8 mcg Intravenous Q M,W,F-HD  . feeding supplement (NEPRO CARB STEADY)  237 mL Oral TID WC  . gabapentin  100 mg Oral QHS  . heparin injection (subcutaneous)  5,000 Units Subcutaneous Q8H  . pantoprazole  40 mg Oral BID    ammonium lactate, HYDROmorphone (DILAUDID) injection, hydrOXYzine, ondansetron **OR** ondansetron (ZOFRAN) IV  Exam: General: WDWN male Facial edema and jvd better today Head: NCAT sclera not icteric  Lungs: clear bilat Heart: RRR with S1 S2 Abdomen: distended +tender to mild palpation  Lower extremities: no LE edema Dialysis Access: LUE AVF +thrill    Dialysis: MWF [facility] 4h   78kg   2/2.25 bath  Hep none  L AVF Hectorol 22mg IV TIW Venofer 561mIV q week Mircera 15047mq 2 weeks (last 1/25)      Impression:  1  Abd pain / recur pancreatitis/Ab normal liver imaging  -  Loculated pseudocyst/ peripancreatic fluid collection - planning transfer to [facility] for endoscopic drainage when bed available  2 Reduced EF 20-25% on Echo/xm pericardial effusion  2  ESRD HD MWF.  3  Vol overload- recently dry wt raised, pt wants to try to get more vol off, extra HD today , UF as tol 4  Anemia Hb 10, follow 5  HTN  6  Hx DVT on coumadin   Plan - extra HD, UF as tol, lower dry if possible  RobKelly Splinter [facility] pgr ([id number]/03/2018, 11:11  AM        Recent Labs  Lab 08/27/17 2159 08/28/17 0651 08/29/17 0742 08/30/17 0433  NA 137 137 137 137  K 5.2* 4.1 4.9 4.5  CL 100* 97* 97* 98*  CO2 21* _0 GLUCOSE 119* 86 98 87  BUN 78* 32* 52* 34*  CREATININE 10.32* 6.40* 8.58* 6.19*  CALCIUM 8.1* 8.4* 8.7* 8.8*  PHOS 6.6*  --   --   --    Recent Labs  Lab 08/24/17 0042 08/27/17 2159 08/28/17 0651  AST 29  --  91*  ALT 18  --  127*  ALKPHOS 124  --  109  BILITOT 0.9  --  1.1  PROT 7.3  --  7.1  ALBUMIN 3.0* 2.7* 2.8*   Recent Labs  Lab 08/27/17 2159 08/28/17 0651 08/29/17 0742  WBC 3.5* 3.6* 3.0*  HGB 9.9* 10.3* 10.7*  HCT 30.1* 31.9* 33.3*  MCV 86.7 87.9 89.5  PLT 132* 109* 124*   Iron/TIBC/Ferritin/ %Sat    Component Value Date/Time   IRON 41 (L) 05/29/2016 1350   TIBC 190 (L) 05/29/2016 1350   FERRITIN 328 11/02/2015 0346   IRONPCTSAT 22 05/29/2016  [place]

## 2017-08-30 NOTE — Progress Notes (Signed)
[name]: [phone]  Patient name: Frank [name] Medical record number: [id number] Date of birth: 08/16/1965 Age: 52 y.o. Gender: male  Primary Care Provider: No primary care provider on file. Consultants: Nephrology Code Status: Full  Pt Overview and Major Events to Date:  Frank [name] is a 52 y.o. male presenting with abdominal pain thought to be secondary to acute on chronic pancreatitis. PMH is significant for PMH is significant forESRD on MWF HD, HTN, HFrEF, NICM,DVT/PE on Coumadin, anxiety and depression, chronic pain syndrome, anemia of chronic disease, GERD, chronic pancreatitis, HLD, OSA.  Assessment and Plan:  Abdominal Pain from acute on chronic pancreatitis with enlarging pseudocyst: Continued abdominal pain with stable vitals. Getting the PRN Dilaudid every 4 hours. Accepted by Dr. Francella Solian of [place] for EUS guided drainage of large peripancreatic pseudocyst which is causing compression of the stomach (no one in [place] performs this procedure). No bed until Sunday or Monday. - Gastroenterology following; appreciate recs - Zofran prn for nausea -  Diet as tolerated, soft solids transition to reg diet as tolerated - Change Dilaudid from 63m q4 to 147mq3 PRN secondary to uncontrolled pain, Tylenol prn for pain control - Cont Protonix 4017mID  HFrEF / NICM: Clinically unchanged but ECHO results show acute changes. ECHO 08/27/2017 shows EF 20-25%, global hypokinesis, severe LAE, mild mitral regurg. Does not appear volume overloaded. Patient presented with symptoms of SOB. Questionable PND vs sleep apnea at home. Does have Cardiomegaly. CT Abd did show enlarging pericardial effusion with right pleural effusion and dependent atelectasis in the right lung. -  Dr. BenHaroldine Laws see in outpatient HF Clinic  - Cont Coreg 32m54md and Imdur 60mg19mly - Fluid status management with HD - Patient cleared for EUS procedure from Cardiac  standpoint - Will need outpatient follow up for his HF - continue to monitor O2 sats  Possible Cirrohsis: Acute. Patient does not endorse history of ETOH consumption. Hepatitis C panel negative. In light of pancreatic pseudocyst the diagnosis of cirrhosis is questionable. - GI on board; appreciate recs. Actual diagnosis of cirrhosis may have been premature. As it looks like his worsening pancreatic pseuodcyst is a much better explanation for his current symptoms. See note on abdominal pain.  ESRD on MWF HD: [facility] Has not missed any HD sessions recently per patient. Does not appear volume overloaded. Extra HD today per nephro.  - Nephro managing HD; greatly appreciate their recs and management   HTN: Controlled  - Cont home Norvasc 10mg 32my, Coreg 32mg b24mDoxazosin 4mg qhs2mConsider restarting Hydralazine 100mg [id number] BP becomes elevated - Likely need to switch home Hydralazine to tid dosing on discharge home  Hx DVT/PE: Has had multiple DVTs in the past. On Coumadin at home. - Coumadin stopped per GI due to pending EUS procedure.  - Heparin per pharm  Anxiety/depression: Chronic, stable. Denies any feelings of depression on admission. - Cont Amitriptyline  Chronic Pain Syndrome: Chronic. - cont Amitriptyline 10mg and 32mpentin 100mg  Anem71mf Chronic Disease: Likely related to CKD. Hgb 10.7 on admission now at 9.9 (baseline ~10) - cont to trend Hgb  HLD: Last lipid panel 08/09/17 with Chol 92, HDL 24, LDL 53, and TG 77. - Cont home Lipitor 10mg  OSA: 37mnic. Stable. - CPAP qhs  GERD: Chronic. Stable. - Restart home Protonix 40mg bid unt74mble to take PO  FEN/GI: Advance diet as tolerated PPx:  Coumadin for DVT/PD  Disposition: [facility]   Subjective:  Patient see in HD. Patient endorses continued abdominal pain. Pain relieved for 30 mins-1 hour but then comes back again after getting Dilaudid. Last BM on Thursday. Not eating  very well but is taking bites here and there and drinking protein shakes.   Objective: Temp:  [97.8 F (36.6 C)-98.2 F (36.8 C)] 97.8 F (36.6 C) (02/09 0451) Pulse Rate:  [55-82] 74 (02/09 0451) Resp:  [18] 18 (02/09 0451) BP: (128-156)/(83-107) 128/88 (02/09 0451) SpO2:  [95 %-97 %] 96 % (02/09 0451) Weight:  [175 lb 7.8 oz (79.6 kg)-186 lb 8.2 oz (84.6 kg)] 175 lb 7.8 oz (79.6 kg) (02/08 1656) Physical Exam:  Gen: Alert and Oriented x 3, NAD HEENT: Normocephalic, atraumatic, PERRLA, EOMI CV: RRR, no murmurs, normal S1, S2 split, +2 pulses dorsalis pedis bilaterally Resp: CTAB, no wheezing, rales, or rhonchi, comfortable work of breathing Abd: mild distended, moderately tender in all quadrants, soft, +bs in all four quadrants MSK: FROM in all four extremities Ext: no clubbing, cyanosis, or edema Skin: warm, dry, intact, no rashes  Laboratory: Recent Labs  Lab 08/27/17 2159 08/28/17 0651 08/29/17 0742  WBC 3.5* 3.6* 3.0*  HGB 9.9* 10.3* 10.7*  HCT 30.1* 31.9* 33.3*  PLT 132* 109* 124*   Recent Labs  Lab 08/24/17 0042  08/28/17 0651 08/29/17 0742 08/30/17 0433  NA 140   < > 137 137 137  K 5.0   < > 4.1 4.9 4.5  CL 101   < > 97* 97* 98*  CO2 22   < > _0 BUN 75*   < > 32* 52* 34*  CREATININE 9.83*   < > 6.40* 8.58* 6.19*  CALCIUM 8.8*   < > 8.4* 8.7* 8.8*  PROT 7.3  --  7.1  --   --   BILITOT 0.9  --  1.1  --   --   ALKPHOS 124  --  109  --   --   ALT 18  --  127*  --   --   AST 29  --  91*  --   --   GLUCOSE 101*   < > 86 98 87   < > = values in this interval not displayed.   2/2 - U/A: Pending 2/3 - Lipase: 88 2/3 - Protime/INR: 15.9/16.8 2/5 - Hep C Panel: HCV Ab <0.1 2/6 - Amylase: pending 2/6 - Albumin: pending 2/6 - Anti-smooth muscle: pending 2/6 - ANA w/reflex: pending 2/6 - Alpha-1-antitrypsin: pending 2/6 - mitochondrial antibody: pending  Imaging/Diagnostic Tests:  2/3 - CXR:  FINDINGS: Cardiomegaly with pulmonary vascular  congestion. Very mild interstitial edema is possible, although this is improved from the prior. Suspected trace bilateral pleural effusions. No pneumothorax.  IMPRESSION: Cardiomegaly with possible mild interstitial edema, although improved from the prior.  Trace bilateral pleural effusions.  2/3 - Abd X-ray: FINDINGS: Nonobstructive bowel gas pattern.  Multiple air-fluid levels on this single upright abdominal radiograph, raising the possibility of small bowel enteritis.  No evidence of free air under the diaphragm on the upright view.  IMPRESSION: No evidence of small bowel obstruction or free air.  Multiple air-fluid levels, raising the possibility of small bowel Enteritis.  2/4 - KUB: IMPRESSION: Oral contrast present for CT. No suspicion of ileus, obstruction or free air.  2/6 - Abd U/S: IMPRESSION: 1. Peripancreatic/left upper quadrant fluid collection as seen on CT, possibly a pseudocyst. 2. Gallbladder wall thickening, pericholecystic fluid,  and positive sonographic Murphy sign. No gallstones. Acalculus cholecystitis is possible in the appropriate clinical setting. 3. Severely atrophic native kidneys.  Failed renal transplant.  2/6 - ECHO: Impressions: - The LV function is markedly reduced. Grade 3 diastolic dysfunction there is a small - moderate sized pericardial effusion but no evidence of RA or RV collapse.   Carlyle Dolly, MD 08/30/2017, 9:35 AM PGY-3, [place] Intern pager: [phone], text pages welcome

## 2017-08-31 LAB — BASIC METABOLIC PANEL
ANION GAP: 17 — AB (ref 5–15)
BUN: 36 mg/dL — AB (ref 6–20)
CALCIUM: 8.9 mg/dL (ref 8.9–10.3)
CO2: 21 mmol/L — AB (ref 22–32)
Chloride: 99 mmol/L — ABNORMAL LOW (ref 101–111)
Creatinine, Ser: 6.37 mg/dL — ABNORMAL HIGH (ref 0.61–1.24)
GFR calc Af Amer: 10 mL/min — ABNORMAL LOW (ref >60)
GFR calc non Af Amer: 9 mL/min — ABNORMAL LOW (ref >60)
Glucose, Bld: 95 mg/dL (ref 65–99)
POTASSIUM: 5.1 mmol/L (ref 3.5–5.1)
Sodium: 137 mmol/L (ref 135–145)

## 2017-08-31 LAB — PROTIME-INR
INR: 1.28
PROTHROMBIN TIME: 15.9 s — AB (ref 11.4–15.2)

## 2017-08-31 LAB — HEPARIN LEVEL (UNFRACTIONATED): Heparin Unfractionated: 0.15 IU/mL — ABNORMAL LOW (ref 0.30–0.70)

## 2017-08-31 MED ORDER — HEPARIN BOLUS VIA INFUSION
4000.0000 [IU] | Freq: Once | INTRAVENOUS | Status: AC
  Administered 2017-08-31: 4000 [IU] via INTRAVENOUS
  Filled 2017-08-31: qty 4000

## 2017-08-31 MED ORDER — HEPARIN (PORCINE) IN NACL 100-0.45 UNIT/ML-% IJ SOLN
1950.0000 [IU]/h | INTRAMUSCULAR | Status: DC
  Administered 2017-08-31: 1050 [IU]/h via INTRAVENOUS
  Administered 2017-09-01: 1300 [IU]/h via INTRAVENOUS
  Administered 2017-09-02: 1200 [IU]/h via INTRAVENOUS
  Administered 2017-09-03: 1350 [IU]/h via INTRAVENOUS
  Administered 2017-09-03: 1550 [IU]/h via INTRAVENOUS
  Administered 2017-09-04: 1700 [IU]/h via INTRAVENOUS
  Filled 2017-08-31 (×7): qty 250

## 2017-08-31 MED ORDER — ISOSORBIDE MONONITRATE ER 60 MG PO TB24
60.0000 mg | ORAL_TABLET | Freq: Every day | ORAL | Status: DC
  Administered 2017-08-31 – 2017-09-04 (×5): 60 mg via ORAL
  Filled 2017-08-31 (×5): qty 1
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
## 2017-08-31 NOTE — Progress Notes (Signed)
[facility] Progress Note  Subjective:  Planning transfer to [facility] when bed available for endoscopic drainage procedure  No bed available yet Echo with reduced EF 20-25%   Vitals:   08/30/17 1340 08/30/17 1343 08/30/17 2211 08/31/17 0745  BP: (!) 130/92 (!) 151/61 (!) 143/98 (!) 144/103  Pulse: 68 63 74 73  Resp: _0 Temp: 98.5 F (36.9 C)  98 F (36.7 C) 97.6 F (36.4 C)  TempSrc:   Oral Oral  SpO2:   94% 100%  Weight: 75.1 kg (165 lb 9.1 oz)     Height:        Inpatient medications: . amitriptyline  10 mg Oral QHS  . amLODipine  10 mg Oral Daily  . atorvastatin  10 mg Oral q1800  . carvedilol  25 mg Oral BID WC  . doxazosin  4 mg Oral QHS  . doxercalciferol  8 mcg Intravenous Q M,W,F-HD  . feeding supplement (NEPRO CARB STEADY)  237 mL Oral TID WC  . gabapentin  100 mg Oral QHS  . heparin injection (subcutaneous)  5,000 Units Subcutaneous Q8H  . pantoprazole  40 mg Oral BID    ammonium lactate, HYDROmorphone (DILAUDID) injection, hydrOXYzine, ondansetron **OR** ondansetron (ZOFRAN) IV  Exam: General: WDWN male Facial edema and jvd better today Head: NCAT sclera not icteric  Lungs: clear bilat Heart: RRR with S1 S2 Abdomen: distended +tender to mild palpation  Lower extremities: no LE edema Dialysis Access: LUE AVF +thrill    Dialysis: MWF South 4h   78kg   2/2.25 bath  Hep none  L AVF Hectorol 42mg IV TIW Venofer 560mIV q week Mircera 15056mq 2 weeks (last 1/25)      Impression:  1  Abd pain / recur pancreatitis/Ab normal liver imaging  -  Loculated pseudocyst/ peripancreatic fluid collection - planning transfer to [facility] for endoscopic drainage when bed available  2 Reduced EF 20-25% on echo 2  ESRD HD MWF.  3  Vol overload- down to 75kg w extra HD yest, lower dry at dc 4  Anemia Hb 10, follow 5  HTN  6  Hx DVT on coumadin   Plan - HD Monday  RobKelly Splinter [facility] pgr [phone]/04/2018, 8:10  AM        Recent Labs  Lab 08/27/17 2159 08/28/17 0651 08/29/17 0742 08/30/17 0433  NA 137 137 137 137  K 5.2* 4.1 4.9 4.5  CL 100* 97* 97* 98*  CO2 21* _1 GLUCOSE 119* 86 98 87  BUN 78* 32* 52* 34*  CREATININE 10.32* 6.40* 8.58* 6.19*  CALCIUM 8.1* 8.4* 8.7* 8.8*  PHOS 6.6*  --   --   --    Recent Labs  Lab 08/27/17 2159 08/28/17 0651  AST  --  91*  ALT  --  127*  ALKPHOS  --  109  BILITOT  --  1.1  PROT  --  7.1  ALBUMIN 2.7* 2.8*   Recent Labs  Lab 08/27/17 2159 08/28/17 0651 08/29/17 0742  WBC 3.5* 3.6* 3.0*  HGB 9.9* 10.3* 10.7*  HCT 30.1* 31.9* 33.3*  MCV 86.7 87.9 89.5  PLT 132* 109* 124*   Iron/TIBC/Ferritin/ %Sat    Component Value Date/Time   IRON 41 (L) 05/29/2016 1350   TIBC 190 (L) 05/29/2016 1350   FERRITIN 328 11/02/2015 0346   IRONPCTSAT 22 05/29/2016 1350
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
## 2017-08-31 NOTE — Progress Notes (Signed)
[name]: [phone]  Patient name: Frank [name] Medical record number: [id number] Date of birth: [birth year] Age: 52 y.o. Gender: male  Primary Care Provider: No primary care provider on file. Consultants: Nephrology Code Status: Full  Pt Overview and Major Events to Date:  CALLEN [name] is a 52 y.o. male presenting with abdominal pain thought to be secondary to acute on chronic pancreatitis. PMH is significant for PMH is significant forESRD on MWF HD, HTN, HFrEF, NICM,DVT/PE on Coumadin, anxiety and depression, chronic pain syndrome, anemia of chronic disease, GERD, chronic pancreatitis, HLD, OSA.  Assessment and Plan:  Abdominal Pain from acute on chronic pancreatitis with enlarging pseudocyst: Continued abdominal pain with stable vitals. Getting the PRN Dilaudid every 4 hours. Accepted by Dr. Francella Solian of [place] for EUS guided drainage of large peripancreatic pseudocyst which is causing compression of the stomach (no one in [place] performs this procedure). No bed until Sunday or Monday. - Gastroenterology following; appreciate recs - Zofran prn for nausea -  Diet renal restrictive diet - Dilaudid 59m q3 PRN secondary to uncontrolled pain, Tylenol prn for pain control - Cont Protonix 462mBID  HFrEF / NICM: Clinically unchanged but ECHO results show acute changes. ECHO 08/27/2017 shows EF 20-25%, global hypokinesis, severe LAE, mild mitral regurg. Does not appear volume overloaded. Patient presented with symptoms of SOB. Questionable PND vs sleep apnea at home. Does have Cardiomegaly. CT Abd did show enlarging pericardial effusion with right pleural effusion and dependent atelectasis in the right lung. -  Dr. BeHaroldine Lawso see in outpatient HF Clinic  - Cont Coreg 2567mid and Imdur 41m47mily - Fluid status management with HD - Patient cleared for EUS procedure from Cardiac standpoint  Possible Cirrohsis: Acute. Patient does not endorse  history of ETOH consumption. Hepatitis C panel negative. In light of pancreatic pseudocyst the diagnosis of cirrhosis is questionable. - GI on board; appreciate recs. Actual diagnosis of cirrhosis may have been premature. As it looks like his worsening pancreatic pseuodcyst is a much better explanation for his current symptoms. See note on abdominal pain.  ESRD on MWF HD: [facility] Has not missed any HD sessions recently per patient. Does not appear volume overloaded. Extra HD today per nephro.  - Nephro managing HD; greatly appreciate their recs and management   HTN: Controlled. Patient has mostly been at goal over the past 24hrs on home regimen SBP 99-151 and DBP 61-98. - Cont home Norvasc 10mg77mly, Coreg 25mg 19m Doxazosin 4mg qh63m Consider restarting Hydralazine 100mg da31mif BP becomes consistently elevated - Likely need to switch home Hydralazine to tid dosing on discharge home  Hx DVT/PE: Has had multiple DVTs in the past. On Coumadin at home. - Coumadin stopped per GI due to pending EUS procedure.  - Heparin per pharm  Anxiety/depression: Chronic, stable. Denies any feelings of depression on admission. - Cont Amitriptyline  Chronic Pain Syndrome: Chronic. - cont Amitriptyline 10mg and49mapentin 100mg  Ane56mof Chronic Disease: Likely related to CKD. Hgb 10.7 on admission now at 9.9 (baseline ~10) - cont to trend Hgb  HLD: Last lipid panel 08/09/17 with Chol 92, HDL 24, LDL 53, and TG 77. - Cont home Lipitor 10mg  OSA:67monic. Stable. - CPAP qhs  GERD: Chronic. Stable. - Restart home Protonix 40mg bid un55mable to take PO  FEN/GI: Advance diet as tolerated PPx: Coumadin for DVT/PD  Disposition: [facility]   Subjective:  Patient states today he is doing well with no complaints. He did have a "small" bowel movement earlier this morning and is passing gas. His abdominal pain is still well controlled on Dilaudid. He is eating in  limited amounts and drinking Ensure. He states "my eyes want to eat but my stomach says no". He denies chest pain, SOB, nausea, vomiting.   Objective: Temp:  [98 F (36.7 C)-98.5 F (36.9 C)] 98 F (36.7 C) (02/09 2211) Pulse Rate:  [54-74] 74 (02/09 2211) Resp:  [16-18] 16 (02/09 2211) BP: (99-151)/(61-98) 143/98 (02/09 2211) SpO2:  [94 %] 94 % (02/09 2211) Weight:  [165 lb 9.1 oz (75.1 kg)-176 lb 9.4 oz (80.1 kg)] 165 lb 9.1 oz (75.1 kg) (02/09 1340) Physical Exam:  Gen: Alert and Oriented x 3, NAD HEENT: Normocephalic, atraumatic, PERRLA, EOMI CV: RRR, no murmurs, normal S1, S2 split, +2 pulses dorsalis pedis bilaterally Resp: CTAB, no wheezing, rales, or rhonchi, comfortable work of breathing Abd: mild distended, mildly tender to palpation in RUQ and LUQ, soft, +bs in all four quadrants, MSK: FROM in all four extremities Ext: no clubbing, cyanosis, or edema Skin: warm, dry, intact, no rashes  Laboratory: Recent Labs  Lab 08/27/17 2159 08/28/17 0651 08/29/17 0742  WBC 3.5* 3.6* 3.0*  HGB 9.9* 10.3* 10.7*  HCT 30.1* 31.9* 33.3*  PLT 132* 109* 124*   Recent Labs  Lab 08/28/17 0651 08/29/17 0742 08/30/17 0433  NA 137 137 137  K 4.1 4.9 4.5  CL 97* 97* 98*  CO2 _0 BUN 32* 52* 34*  CREATININE 6.40* 8.58* 6.19*  CALCIUM 8.4* 8.7* 8.8*  PROT 7.1  --   --   BILITOT 1.1  --   --   ALKPHOS 109  --   --   ALT 127*  --   --   AST 91*  --   --   GLUCOSE 86 98 87   2/2 - U/A: Pending 2/3 - Lipase: 88 2/3 - Protime/INR: 15.9/16.8 2/5 - Hep C Panel: HCV Ab <0.1 2/6 - Amylase: pending 2/6 - Albumin: pending 2/6 - Anti-smooth muscle: pending 2/6 - ANA w/reflex: pending 2/6 - Alpha-1-antitrypsin: pending 2/6 - mitochondrial antibody: pending  Imaging/Diagnostic Tests:  2/3 - CXR:  FINDINGS: Cardiomegaly with pulmonary vascular congestion. Very mild interstitial edema is possible, although this is improved from the prior. Suspected trace bilateral pleural  effusions. No pneumothorax.  IMPRESSION: Cardiomegaly with possible mild interstitial edema, although improved from the prior.  Trace bilateral pleural effusions.  2/3 - Abd X-ray: FINDINGS: Nonobstructive bowel gas pattern.  Multiple air-fluid levels on this single upright abdominal radiograph, raising the possibility of small bowel enteritis.  No evidence of free air under the diaphragm on the upright view.  IMPRESSION: No evidence of small bowel obstruction or free air.  Multiple air-fluid levels, raising the possibility of small bowel Enteritis.  2/4 - KUB: IMPRESSION: Oral contrast present for CT. No suspicion of ileus, obstruction or free air.  2/6 - Abd U/S: IMPRESSION: 1. Peripancreatic/left upper quadrant fluid collection as seen on CT, possibly a pseudocyst. 2. Gallbladder wall thickening, pericholecystic fluid, and positive sonographic Murphy sign. No gallstones. Acalculus cholecystitis is possible in the appropriate clinical setting. 3. Severely atrophic native kidneys.  Failed renal transplant.  2/6 - ECHO: Impressions: - The LV function is markedly reduced. Grade 3 diastolic dysfunction there is a small - moderate sized pericardial effusion but no evidence of RA or RV collapse.   Nuala Alpha, DO  08/31/2017, 6:53 AM PGY-3, [place] Intern pager: [phone], text pages welcome

## 2017-09-01 LAB — CBC
HEMATOCRIT: 30.9 % — AB (ref 39.0–52.0)
Hemoglobin: 10.2 g/dL — ABNORMAL LOW (ref 13.0–17.0)
MCH: 28.7 pg (ref 26.0–34.0)
MCHC: 33 g/dL (ref 30.0–36.0)
MCV: 87 fL (ref 78.0–100.0)
PLATELETS: 150 10*3/uL (ref 150–400)
RBC: 3.55 MIL/uL — ABNORMAL LOW (ref 4.22–5.81)
RDW: 18.6 % — AB (ref 11.5–15.5)
WBC: 3.7 10*3/uL — ABNORMAL LOW (ref 4.0–10.5)

## 2017-09-01 LAB — BASIC METABOLIC PANEL WITH GFR
Anion gap: 16 — ABNORMAL HIGH (ref 5–15)
BUN: 58 mg/dL — ABNORMAL HIGH (ref 6–20)
CO2: 23 mmol/L (ref 22–32)
Calcium: 8.9 mg/dL (ref 8.9–10.3)
Chloride: 98 mmol/L — ABNORMAL LOW (ref 101–111)
Creatinine, Ser: 8.97 mg/dL — ABNORMAL HIGH (ref 0.61–1.24)
GFR calc Af Amer: 7 mL/min — ABNORMAL LOW
GFR calc non Af Amer: 6 mL/min — ABNORMAL LOW
Glucose, Bld: 83 mg/dL (ref 65–99)
Potassium: 4.4 mmol/L (ref 3.5–5.1)
Sodium: 137 mmol/L (ref 135–145)

## 2017-09-01 LAB — PROTIME-INR
INR: 1.35
Prothrombin Time: 16.6 seconds — ABNORMAL HIGH (ref 11.4–15.2)

## 2017-09-01 LAB — HEPARIN LEVEL (UNFRACTIONATED): Heparin Unfractionated: 0.9 IU/mL — ABNORMAL HIGH (ref 0.30–0.70)

## 2017-09-01 MED ORDER — HYDROMORPHONE HCL 1 MG/ML IJ SOLN
INTRAMUSCULAR | Status: AC
  Administered 2017-09-01: 1 mg via INTRAVENOUS
  Filled 2017-09-01: qty 1

## 2017-09-01 MED ORDER — DOXERCALCIFEROL 4 MCG/2ML IV SOLN
INTRAVENOUS | Status: AC
  Administered 2017-09-01: 8 ug via INTRAVENOUS
  Filled 2017-09-01: qty 4

## 2017-09-01 NOTE — Progress Notes (Signed)
[facility] ROUNDING NOTE   Subjective:   End stage renal disease was admitted for abdominal pain and recurrent pancreatitis and is being transferred to [facility] today for endoscopic drainage of a large loculated pseudocyst.   Objective:  Vital signs in last 24 hours:  Temp:  [97.8 F (36.6 C)-98.3 F (36.8 C)] 98.3 F (36.8 C) (02/11 0545) Pulse Rate:  [69-74] 69 (02/11 0545) Resp:  [18] 18 (02/11 0545) BP: (136-154)/(86-99) 136/92 (02/11 0545) SpO2:  [97 %-98 %] 97 % (02/11 0545)  Weight change:  Filed Weights   08/29/17 1656 08/30/17 0925 08/30/17 1340  Weight: 175 lb 7.8 oz (79.6 kg) 176 lb 9.4 oz (80.1 kg) 165 lb 9.1 oz (75.1 kg)    Intake/Output: I/O last 3 completed shifts: In: 2081.4 [P.O.:1582; I.V.:25.4; NG/GT:474] Out: 0    Intake/Output this shift:  No intake/output data recorded.  General:WDWN male Facial edema and jvd better today Head:NCAT sclera not icteric  Lungs:clear bilat Heart:RRR with S1 S2 Abdomen:distended +tender to mild palpation Lower extremities:no LE edema Dialysis Access:LUE AVF +thrill   Dialysis: MWF South 4h   78kg   2/2.25 bath  Hep none  L AVF Hectorol 89mg IV TIW Venofer 531mIV q week Mircera 15072mq 2 weeks (last 1/25)      Basic Metabolic Panel: Recent Labs  Lab 08/27/17 2159 08/28/17 0651 08/29/17 0742 08/30/17 0433 08/31/17 1011  NA 137 137 137 137 137  K 5.2* 4.1 4.9 4.5 5.1  CL 100* 97* 97* 98* 99*  CO2 21* _0 21*  GLUCOSE 119* 86 98 87 95  BUN 78* 32* 52* 34* 36*  CREATININE 10.32* 6.40* 8.58* 6.19* 6.37*  CALCIUM 8.1* 8.4* 8.7* 8.8* 8.9  PHOS 6.6*  --   --   --   --     Liver Function Tests: Recent Labs  Lab 08/27/17 2159 08/28/17 0651  AST  --  91*  ALT  --  127*  ALKPHOS  --  109  BILITOT  --  1.1  PROT  --  7.1  ALBUMIN 2.7* 2.8*   No results for input(s): LIPASE, AMYLASE in the last 168 hours. No results for input(s): AMMONIA in the last 168  hours.  CBC: Recent Labs  Lab 08/25/17 1600 08/27/17 2159 08/28/17 0651 08/29/17 0742  WBC 8.5 3.5* 3.6* 3.0*  HGB 10.9* 9.9* 10.3* 10.7*  HCT 33.5* 30.1* 31.9* 33.3*  MCV 89.1 86.7 87.9 89.5  PLT 126* 132* 109* 124*    Cardiac Enzymes: No results for input(s): CKTOTAL, CKMB, CKMBINDEX, TROPONINI in the last 168 hours.  BNP: Invalid input(s): POCBNP  CBG: Recent Labs  Lab 08/25/17 1927 08/25/17 2006 08/27/17 0735 08/27/17 2331  GLUCAP 53*335*8956* 109*    Microbiology: Results for orders placed or performed during the hospital encounter of 08/08/17  MRSA PCR Screening     Status: None   Collection Time: 08/09/17  6:59 PM  Result Value Ref Range Status   MRSA by PCR NEGATIVE NEGATIVE Final    Comment:        The GeneXpert MRSA Assay (FDA approved for NASAL specimens only), is one component of a comprehensive MRSA colonization surveillance program. It is not intended to diagnose MRSA infection nor to guide or monitor treatment for MRSA infections.     Coagulation Studies: Recent Labs    08/30/17 0433 08/31/17 1011  LABPROT 18.8* 15.9*  INR 1.59 1.28    Urinalysis: No results for input(s): COLORURINE, LABSPEC,  PHURINE, GLUCOSEU, HGBUR, BILIRUBINUR, KETONESUR, PROTEINUR, UROBILINOGEN, NITRITE, LEUKOCYTESUR in the last 72 hours.  Invalid input(s): APPERANCEUR    Imaging: No results found.   Medications:   . heparin 1,300 Units/hr (09/01/17 0610)   . amitriptyline  10 mg Oral QHS  . amLODipine  10 mg Oral Daily  . atorvastatin  10 mg Oral q1800  . carvedilol  25 mg Oral BID WC  . doxazosin  4 mg Oral QHS  . doxercalciferol  8 mcg Intravenous Q M,W,F-HD  . feeding supplement (NEPRO CARB STEADY)  237 mL Oral TID WC  . gabapentin  100 mg Oral QHS  . isosorbide mononitrate  60 mg Oral Daily  . pantoprazole  40 mg Oral BID   ammonium lactate, HYDROmorphone (DILAUDID) injection, hydrOXYzine, ondansetron **OR** ondansetron (ZOFRAN)  IV  Assessment/ Plan:  1  Abd pain / recur pancreatitis/Ab normal liver imaging  -  Loculated pseudocyst/ peripancreatic fluid collection - planning transfer to [facility] for endoscopic drainage when bed available  2 Reduced EF 20-25% on echo 2  ESRD HD MWF.  3  Vol overload-  Continues to be challenged on dialysis 4  Anemia Hb 10.7 , follow 5  HTN  6  Hx DVT on coumadin    LOS: 8 Tashala Cumbo W _0 _1 :25 AM

## 2017-09-01 NOTE — Progress Notes (Signed)
Attempt number two for Dialysis, Derald Macleod, RN, states patient will not attend his ordered HD treatment at this time, Ernest Haber, PA aware

## 2017-09-01 NOTE — Progress Notes (Signed)
[name]: [phone]  Patient name: Frank [name] Medical record number: [id number] Date of birth: February 09, 1966 Age: 52 y.o. Gender: male  Primary Care Provider: No primary care provider on file. Consultants: Nephrology Code Status: Full  Pt Overview and Major Events to Date:  Frank [name] is a 52 y.o. male presenting with abdominal pain thought to be secondary to acute on chronic pancreatitis. PMH is significant for PMH is significant forESRD on MWF HD, HTN, HFrEF, NICM,DVT/PE on Coumadin, anxiety and depression, chronic pain syndrome, anemia of chronic disease, GERD, chronic pancreatitis, HLD, OSA.  Assessment and Plan:  Abdominal Pain from acute on chronic pancreatitis with enlarging pseudocyst: Continued abdominal pain with stable vitals. Getting the PRN Dilaudid every 3 hours. Accepted by Dr. Francella Solian of [facility] for EUS guided drainage of large peripancreatic pseudocyst which is causing compression of the stomach (no one in [place] performs this procedure). No bed available on Sunday. Hopefully, bed will become available today (2/11)  - Gastroenterology following; appreciate recs - Zofran prn for nausea -  Diet renal restrictive diet - Dilaudid 91m q3 PRN secondary to uncontrolled pain, Tylenol prn for pain control - Cont Protonix 41mBID  HFrEF / NICM: Clinically unchanged but ECHO results show acute changes. ECHO 08/27/2017 shows EF 20-25%, global hypokinesis, severe LAE, mild mitral regurg. Does not appear volume overloaded. Patient presented with symptoms of SOB. Questionable PND vs sleep apnea at home. Does have Cardiomegaly. CT Abd did show enlarging pericardial effusion with right pleural effusion and dependent atelectasis in the right lung. -  Dr. BeHaroldine [name] see in outpatient HF Clinic  - Cont Coreg 2515mid and Imdur 47m43mily - Fluid status management with HD - Patient cleared for EUS procedure from Cardiac  standpoint  Possible Cirrohsis: Acute. Patient does not endorse history of ETOH consumption. Hepatitis C panel negative. In light of pancreatic pseudocyst the diagnosis of cirrhosis is questionable. - GI on board; appreciate recs. Actual diagnosis of cirrhosis may have been premature. As it looks like his worsening pancreatic pseuodcyst is a much better explanation for his current symptoms. See note on abdominal pain.  ESRD on MWF HD: [facility] Has not missed any HD sessions recently per patient. Does not appear volume overloaded. Extra HD today per nephro.  - Nephro managing HD; greatly appreciate their recs and management   HTN: Controlled. Patient has mostly been at goal over the past 24hrs on home regimen SBP 136-154 and DBP 86-103. - Cont home Norvasc 10mg48mly, Coreg 25mg 66m Doxazosin 4mg qh61m Consider restarting Hydralazine 100mg da87mif BP becomes consistently elevated - Likely need to switch home Hydralazine to tid dosing on discharge home  Hx DVT/PE: Has had multiple DVTs in the past. On Coumadin at home. - Coumadin stopped per GI due to pending EUS procedure.  - Heparin per pharm  Anxiety/depression: Chronic, stable. Denies any feelings of depression on admission. - Cont Amitriptyline  Chronic Pain Syndrome: Chronic. Cont home meds - cont Amitriptyline 10mg and8mapentin 100mg  Ane37mof Chronic Disease: Likely related to CKD. Hgb 10.7 on admission now at 9.9 (baseline ~10) - cont to trend Hgb  HLD: Last lipid panel 08/09/17 with Chol 92, HDL 24, LDL 53, and TG 77. - Cont home Lipitor 10mg  OSA:91monic. Stable. - CPAP qhs  GERD: Chronic. Stable. - Restart home Protonix 40mg bid un3mable to take PO  FEN/GI: Advance diet as tolerated  PPx: Coumadin for DVT/PD  Disposition: [facility]   Subjective:  Patient states today he is doing well and hopeful that he can get transferred to [facility] today. He still is endorsing abdominal pain  but is able to eat and his appetite is not back to his baseline but improved. He was eating when I came into the room. Overall, he has no complaints and states his pain is well controlled on Dilaudid.  Objective: Temp:  [97.6 F (36.4 C)-98.3 F (36.8 C)] 98.3 F (36.8 C) (02/11 0545) Pulse Rate:  [69-74] 69 (02/11 0545) Resp:  [16-18] 18 (02/11 0545) BP: (136-154)/(86-103) 136/92 (02/11 0545) SpO2:  [97 %-100 %] 97 % (02/11 0545) Physical Exam:  Gen: Alert and Oriented x 3, NAD HEENT: Normocephalic, atraumatic, PERRLA, EOMI CV: RRR, no murmurs, normal S1, S2 split, +2 pulses dorsalis pedis bilaterally Resp: CTAB, no wheezing, rales, or rhonchi, comfortable work of breathing Abd: mild distended, mildly tender to palpation in RUQ and LUQ, soft, +bs in all four quadrants MSK: FROM in all four extremities Ext: no clubbing, cyanosis, or edema Skin: warm, dry, intact, no rashes  Laboratory: Recent Labs  Lab 08/27/17 2159 08/28/17 0651 08/29/17 0742  WBC 3.5* 3.6* 3.0*  HGB 9.9* 10.3* 10.7*  HCT 30.1* 31.9* 33.3*  PLT 132* 109* 124*   Recent Labs  Lab 08/28/17 0651 08/29/17 0742 08/30/17 0433 08/31/17 1011  NA 137 137 137 137  K 4.1 4.9 4.5 5.1  CL 97* 97* 98* 99*  CO2 _0 21*  BUN 32* 52* 34* 36*  CREATININE 6.40* 8.58* 6.19* 6.37*  CALCIUM 8.4* 8.7* 8.8* 8.9  PROT 7.1  --   --   --   BILITOT 1.1  --   --   --   ALKPHOS 109  --   --   --   ALT 127*  --   --   --   AST 91*  --   --   --   GLUCOSE 86 98 87 95   2/2 - U/A: Pending 2/3 - Lipase: 88 2/3 - Protime/INR: 15.9/16.8 2/5 - Hep C Panel: HCV Ab <0.1 2/6 - Amylase: pending 2/6 - Albumin: pending 2/6 - Anti-smooth muscle: pending 2/6 - ANA w/reflex: pending 2/6 - Alpha-1-antitrypsin: pending 2/6 - mitochondrial antibody: pending  Imaging/Diagnostic Tests:  2/3 - CXR:  FINDINGS: Cardiomegaly with pulmonary vascular congestion. Very mild interstitial edema is possible, although this is improved  from the prior. Suspected trace bilateral pleural effusions. No pneumothorax.  IMPRESSION: Cardiomegaly with possible mild interstitial edema, although improved from the prior.  Trace bilateral pleural effusions.  2/3 - Abd X-ray: FINDINGS: Nonobstructive bowel gas pattern.  Multiple air-fluid levels on this single upright abdominal radiograph, raising the possibility of small bowel enteritis.  No evidence of free air under the diaphragm on the upright view.  IMPRESSION: No evidence of small bowel obstruction or free air.  Multiple air-fluid levels, raising the possibility of small bowel Enteritis.  2/4 - KUB: IMPRESSION: Oral contrast present for CT. No suspicion of ileus, obstruction or free air.  2/6 - Abd U/S: IMPRESSION: 1. Peripancreatic/left upper quadrant fluid collection as seen on CT, possibly a pseudocyst. 2. Gallbladder wall thickening, pericholecystic fluid, and positive sonographic Murphy sign. No gallstones. Acalculus cholecystitis is possible in the appropriate clinical setting. 3. Severely atrophic native kidneys.  Failed renal transplant.  2/6 - ECHO: Impressions: - The LV function is markedly reduced. Grade 3 diastolic dysfunction there is a small - moderate  sized pericardial effusion but no evidence of RA or RV collapse.   Nuala Alpha, DO 09/01/2017, 7:15 AM PGY-1, [place] Intern pager: [phone], text pages welcome

## 2017-09-01 NOTE — Progress Notes (Signed)
HD tx initiated via 15Gx2 w/ problem, pull/push/flush equally w/o problem, VSS, will cont to monitor while pt on hD tx

## 2017-09-01 NOTE — Progress Notes (Addendum)
[place] for Heparin Indication: h/o VTE  Allergies  Allergen Reactions  . Butalbital-Apap-Caffeine Shortness Of Breath, Swelling and Other (See Comments)    Swelling in throat  . Ferrlecit [Na Ferric Gluc Cplx In Sucrose] Shortness Of Breath, Swelling and Other (See Comments)    Swelling in throat, tolerates Venofor  . Minoxidil Shortness Of Breath  . Tylenol [Acetaminophen] Anaphylaxis and Swelling  . Darvocet [Propoxyphene N-Acetaminophen] Hives    Patient Measurements: Height: _0  (188 cm) Weight: 165 lb 9.1 oz (75.1 kg) IBW/kg (Calculated) : 82.2 Heparin Dosing Weight: 75.1 kg  Vital Signs: Temp: 98.3 F (36.8 C) (02/11 0545) Temp Source: Oral (02/11 0545) BP: 136/92 (02/11 0545) Pulse Rate: 69 (02/11 0545)  Labs: Recent Labs    08/30/17 0433 08/31/17 1011 08/31/17 2130  LABPROT 18.8* 15.9*  --   INR 1.59 1.28  --   HEPARINUNFRC  --   --  0.15*  CREATININE 6.19* 6.37*  --     Estimated Creatinine Clearance: 14.2 mL/min (A) (by C-G formula based on SCr of 6.37 mg/dL (H)).   Assessment: On Coumadin for hx of DVTs. INR on admit is low at 1.29 with hx noncompliance. Patient currently on IV heparin.   Patient refused labs this morning so unable to assess heparin level. Spoke with phlebotomy who will attempt blood draw again.  - Coumadin PTA-unsure of home dose (med rec says 7.44m x 2 then see Coumadin pharmacist)  Goal of Therapy:  Heparin level 0.3-0.7 units/ml Monitor platelets by anticoagulation protocol: Yes   Plan:  1. Continue heparin infusion to 1300 units/hr 2. Daily heparin level and CBC 3. Monitor for s/sx of bleeding   JRenold Genta PharmD, BCPS Clinical Pharmacist Phone for today - [place]- [phone]/05/2018 10:07 AM    Addendum: Heparin level was able to be obtained and is supratherapeutic at 0.9 on 1300 units/hr.  Decrease heparin drip to 1200 units/hr 8 hr heparin  level  JRenold Genta PharmD, BCPS 3:24 PM

## 2017-09-01 NOTE — Progress Notes (Signed)
Melvenia Needles RN at [facility] transfer center called to check on status of patient. She stated that accepting physician will be Oliver Hum MD.  Possiblility that patient will get a bed today.

## 2017-09-02 LAB — BASIC METABOLIC PANEL
Anion gap: 13 (ref 5–15)
BUN: 37 mg/dL — AB (ref 6–20)
CHLORIDE: 100 mmol/L — AB (ref 101–111)
CO2: 22 mmol/L (ref 22–32)
Calcium: 8.6 mg/dL — ABNORMAL LOW (ref 8.9–10.3)
Creatinine, Ser: 6.94 mg/dL — ABNORMAL HIGH (ref 0.61–1.24)
GFR calc Af Amer: 9 mL/min — ABNORMAL LOW (ref >60)
GFR calc non Af Amer: 8 mL/min — ABNORMAL LOW (ref >60)
Glucose, Bld: 86 mg/dL (ref 65–99)
POTASSIUM: 6.2 mmol/L — AB (ref 3.5–5.1)
SODIUM: 135 mmol/L (ref 135–145)

## 2017-09-02 LAB — ANTINUCLEAR ANTIBODIES, IFA: ANA Ab, IFA: NEGATIVE

## 2017-09-02 LAB — PHOSPHORUS: Phosphorus: 6.3 mg/dL — ABNORMAL HIGH (ref 2.5–4.6)

## 2017-09-02 LAB — HEPARIN LEVEL (UNFRACTIONATED)

## 2017-09-02 MED ORDER — PRO-STAT SUGAR FREE PO LIQD
30.0000 mL | Freq: Two times a day (BID) | ORAL | Status: DC
  Administered 2017-09-02 – 2017-09-03 (×3): 30 mL via ORAL
  Filled 2017-09-02 (×5): qty 30

## 2017-09-02 MED ORDER — SENNOSIDES-DOCUSATE SODIUM 8.6-50 MG PO TABS: 1.0000 | ORAL_TABLET | Freq: Every day | ORAL | Status: DC | PRN

## 2017-09-02 MED ORDER — SENNOSIDES-DOCUSATE SODIUM 8.6-50 MG PO TABS: 1.0000 | ORAL_TABLET | Freq: Every day | ORAL | Status: DC

## 2017-09-02 MED ORDER — RENA-VITE PO TABS
1.0000 | ORAL_TABLET | Freq: Every day | ORAL | Status: DC
  Administered 2017-09-02 – 2017-09-03 (×2): 1 via ORAL
  Filled 2017-09-02 (×3): qty 1

## 2017-09-02 MED ORDER — SEVELAMER CARBONATE 800 MG PO TABS
2400.0000 mg | ORAL_TABLET | Freq: Three times a day (TID) | ORAL | Status: DC
  Administered 2017-09-02 – 2017-09-04 (×5): 2400 mg via ORAL
  Filled 2017-09-02 (×6): qty 3

## 2017-09-02 MED ORDER — POLYETHYLENE GLYCOL 3350 17 G PO PACK
17.0000 g | PACK | Freq: Every day | ORAL | Status: DC
  Filled 2017-09-02: qty 1

## 2017-09-02 MED ORDER — CINACALCET HCL 30 MG PO TABS
90.0000 mg | ORAL_TABLET | Freq: Every day | ORAL | Status: DC
  Administered 2017-09-02 – 2017-09-04 (×3): 90 mg via ORAL
  Filled 2017-09-02 (×3): qty 3

## 2017-09-02 MED ORDER — DARBEPOETIN ALFA 60 MCG/0.3ML IJ SOSY
60.0000 ug | PREFILLED_SYRINGE | INTRAMUSCULAR | Status: DC
  Administered 2017-09-03: 60 ug via INTRAVENOUS

## 2017-09-02 NOTE — Progress Notes (Deleted)
[facility] Progress Note   Subjective: sitting up on side of bed getting labs drawn. Still C/O abdominal tenderness. No SOB. Says he has been walking in halls.   Objective Vitals:   09/02/17 0100 09/02/17 0130 09/02/17 0157 09/02/17 0558  BP: 109/72 130/78 132/72 132/87  Pulse: 67 68 64 75  Resp: 16 (!) _0 Temp:   98.9 F (37.2 C) 98.2 F (36.8 C)  TempSrc:   Oral Oral  SpO2: 99% 98% 98% 98%  Weight:   76.4 kg (168 lb 6.9 oz) 76.4 kg (168 lb 6.9 oz)  Height:       Physical Exam General: WNWD male in NAD Heart:S1,S2 RRR. No JVD at 30 degrees.  Lungs: CTAB A/P Abdomen: still tender upper quads. Active BS.  Extremities: No LE edema.  Dialysis Access: LFA  AVF + bruit   Additional Objective Labs: Basic Metabolic Panel: Recent Labs  Lab 08/27/17 2159  08/31/17 1011 09/01/17 1142 09/02/17 1030  NA 137   < > 137 137 135  K 5.2*   < > 5.1 4.4 6.2*  CL 100*   < > 99* 98* 100*  CO2 21*   < > 21* 23 22  GLUCOSE 119*   < > 95 83 86  BUN 78*   < > 36* 58* 37*  CREATININE 10.32*   < > 6.37* 8.97* 6.94*  CALCIUM 8.1*   < > 8.9 8.9 8.6*  PHOS 6.6*  --   --   --   --    < > = values in this interval not displayed.   Liver Function Tests: Recent Labs  Lab 08/27/17 2159 08/28/17 0651  AST  --  91*  ALT  --  127*  ALKPHOS  --  109  BILITOT  --  1.1  PROT  --  7.1  ALBUMIN 2.7* 2.8*   No results for input(s): LIPASE, AMYLASE in the last 168 hours. CBC: Recent Labs  Lab 08/27/17 2159 08/28/17 0651 08/29/17 0742 09/01/17 1142  WBC 3.5* 3.6* 3.0* 3.7*  HGB 9.9* 10.3* 10.7* 10.2*  HCT 30.1* 31.9* 33.3* 30.9*  MCV 86.7 87.9 89.5 87.0  PLT 132* 109* 124* 150   Blood Culture    Component Value Date/Time   SDES BLOOD RIGHT HAND 03/10/2017 0438   SPECREQUEST  03/10/2017 0438    BOTTLES DRAWN AEROBIC AND ANAEROBIC Blood Culture adequate volume   CULT NO GROWTH 5 DAYS 03/10/2017 0438   REPTSTATUS 03/15/2017 FINAL 03/10/2017 0438    Cardiac  Enzymes: No results for input(s): CKTOTAL, CKMB, CKMBINDEX, TROPONINI in the last 168 hours. CBG: Recent Labs  Lab 08/27/17 0735 08/27/17 2331  GLUCAP 156* 109*   Iron Studies: No results for input(s): IRON, TIBC, TRANSFERRIN, FERRITIN in the last 72 hours. _1 @ Studies/Results: No results found. Medications: . heparin 1,200 Units/hr (09/02/17 0231)   . amitriptyline  10 mg Oral QHS  . amLODipine  10 mg Oral Daily  . atorvastatin  10 mg Oral q1800  . carvedilol  25 mg Oral BID WC  . doxazosin  4 mg Oral QHS  . doxercalciferol  8 mcg Intravenous Q M,W,F-HD  . feeding supplement (NEPRO CARB STEADY)  237 mL Oral TID WC  . gabapentin  100 mg Oral QHS  . isosorbide mononitrate  60 mg Oral Daily  . pantoprazole  40 mg Oral BID  . polyethylene glycol  17 g Oral Daily     Dialysis: MWF [place] 4h  78kg   2/2.25 bath  Hep none  L AVF Hectorol 29mg IV TIW Venofer 51mIV q week Mircera 15062mq 2 weeks (last 1/25)      Impression:  1  Abd pain / recur pancreatitis/Ab normal liver imaging  -  Loculated pseudocyst/ peripancreatic fluid collection - planning transfer to [facility] for endoscopic drainage when bed available. Says he is going tomorrow.  2 Reduced EF 20-25% on echo. Plans for F/U with Dr. BenHaroldine Laws. Continue coreg and imdur.  2  ESRD HD MWF. HD tomorrow 1st shift.  3  Vol overload- HD 09/01/17 Pre wt 81.7 Net UF 5279 Post wt 76.4kg. High gains while in hospital-needs to adhere for fluid restrictions.  lower dry at dc 4  Anemia Hb 10.2, Give Aranesp 60 mcg IV with HD tomorrow. Follow HGB>  5  HTN: BP well controlled. Continue current meds.  6  Hx DVT on heparin gtt per pharmacy.  7. BMD: Ca 8.6 No recent phos. Add renal function panel to today's labs. Resume sensipar and binders. 8. Nutrition: Albumin 2.8. Renal diet-disc fld restrictions with pt. Add prostat/renal vits.    Bryson Palen H. Jarret Torre NP-C 09/02/2017, 12:16 PM  [phone]

## 2017-09-02 NOTE — Progress Notes (Signed)
[name]: [phone]  Patient name: Frank [name] Medical record number: [id number] Date of birth: 09/26/1965 Age: 52 y.o. Gender: male  Primary Care Provider: No primary care provider on file. Consultants: Nephrology Code Status: Full  Pt Overview and Major Events to Date:  Frank [name] is a 52 y.o. male presenting with abdominal pain thought to be secondary to acute on chronic pancreatitis. PMH is significant for PMH is significant forESRD on MWF HD, HTN, HFrEF, NICM,DVT/PE on Coumadin, anxiety and depression, chronic pain syndrome, anemia of chronic disease, GERD, chronic pancreatitis, HLD, OSA.  Assessment and Plan:  Abdominal Pain from acute on chronic pancreatitis with enlarging pseudocyst: Continued abdominal pain with stable vitals. Getting the PRN Dilaudid every 3 hours. Accepting physician will be Dr. Oliver Hum of [facility] for EUS guided drainage of large peripancreatic pseudocyst which is causing compression of the stomach (no one in [place] performs this procedure). No bed available on Monday. Hopefully, bed will become available today (2/12)  - Gastroenterology following; appreciate recs - Zofran prn for nausea -  Diet renal restrictive diet - Dilaudid 63m q3 PRN secondary to uncontrolled pain, Tylenol prn for pain control - Cont Protonix 441mBID - Start bowel regimen of senna prn with scheduled miralax and docusate.   HFrEF / NICM: Clinically unchanged but ECHO results show acute changes. ECHO 08/27/2017 shows EF 20-25%, global hypokinesis, severe LAE, mild mitral regurg. Does not appear volume overloaded. Patient presented with symptoms of SOB. Questionable PND vs sleep apnea at home. Does have Cardiomegaly. CT Abd did show enlarging pericardial effusion with right pleural effusion and dependent atelectasis in the right lung. -  Dr. BeHaroldine Lawso see in outpatient HF Clinic  - Cont Coreg 2575mid and Imdur 9m22mily -  Fluid status management with HD - Patient cleared for EUS procedure from Cardiac standpoint  Possible Cirrohsis: Acute. Patient does not endorse history of ETOH consumption. Hepatitis C panel negative. In light of pancreatic pseudocyst the diagnosis of cirrhosis is questionable. - GI on board; appreciate recs. Actual diagnosis of cirrhosis may have been premature. As it looks like his worsening pancreatic pseuodcyst is a much better explanation for his current symptoms. See note on abdominal pain. - Cirrhosis labs (mitochondrial antibody, alpha-1-antitrypsin, ANA, anti-smooth muscle antibody) negative  ESRD on MWF HD: [facility] Has not missed any HD sessions recently per patient. Does not appear volume overloaded. Extra HD today per nephro.  - Nephro managing HD; greatly appreciate their recs and management   HTN: Controlled. Patient has mostly been at goal over the past 24hrs on home regimen SBP 109-152 and DBP 66-91. Only one reading over goal at 152/91 in past 24hrs. - Cont home Norvasc 10mg33mly, Coreg 25mg 33m Doxazosin 4mg qh44m Consider restarting Hydralazine 100mg da72mif BP becomes consistently elevated  Hx DVT/PE: Has had multiple DVTs in the past. On Coumadin at home. - Coumadin stopped per GI due to pending EUS procedure.  - Heparin per pharm  Anxiety/depression: Chronic, stable. Denies any feelings of depression on admission. - Cont Amitriptyline  Chronic Pain Syndrome: Chronic. Cont home meds - cont Amitriptyline 10mg and67mapentin 100mg  Ane7mof Chronic Disease: Likely related to CKD. Hgb 10.7 on admission now at 9.9 (baseline ~10) - cont to trend Hgb  HLD: Last lipid panel 08/09/17 with Chol 92, HDL 24, LDL 53, and TG 77. - Cont home Lipitor 10mg  OSA:73monic. Stable. -  CPAP qhs  GERD: Chronic. Stable. - Restart home Protonix 7m bid until able to take PO  FEN/GI: Advance diet as tolerated PPx: Coumadin for  DVT/PD  Disposition: [facility]   Subjective:  Patient states today he is doing well and remains in good spirits. He still is having abdominal pain that requires dilaudid for control and he is requesting it regularly. He has no other complaints, he is able to eat some but not a lot but he is drinking supplement shakes. He has no other complaints this morning and is looking forward to transfer to [facility] to have procedure.  Objective: Temp:  [97.7 F (36.5 C)-98.9 F (37.2 C)] 98.2 F (36.8 C) (02/12 0558) Pulse Rate:  [64-76] 75 (02/12 0558) Resp:  [10-24] 19 (02/12 0558) BP: (109-152)/(66-91) 132/87 (02/12 0558) SpO2:  [95 %-100 %] 98 % (02/12 0558) Weight:  [168 lb 6.9 oz (76.4 kg)-180 lb 1.9 oz (81.7 kg)] 168 lb 6.9 oz (76.4 kg) (02/12 0558) Physical Exam:  Gen: Alert and Oriented x 3, NAD HEENT: Normocephalic, atraumatic, PERRLA, EOMI CV: RRR, no murmurs, normal S1, S2 split, +2 pulses dorsalis pedis bilaterally Resp: CTAB, no wheezing, rales, or rhonchi, comfortable work of breathing Abd: non-distended, TTP in the RUQ and LUQ, soft, +bs in all four quadrants, no hepatomegaly MSK: FROM in all four extremities Ext: no clubbing, cyanosis, or edema Skin: warm, dry, intact, no rashes  Laboratory: Recent Labs  Lab 08/28/17 0651 08/29/17 0742 09/01/17 1142  WBC 3.6* 3.0* 3.7*  HGB 10.3* 10.7* 10.2*  HCT 31.9* 33.3* 30.9*  PLT 109* 124* 150   Recent Labs  Lab 08/28/17 0651  08/30/17 0433 08/31/17 1011 09/01/17 1142  NA 137   < > 137 137 137  K 4.1   < > 4.5 5.1 4.4  CL 97*   < > 98* 99* 98*  CO2 25   < > 25 21* 23  BUN 32*   < > 34* 36* 58*  CREATININE 6.40*   < > 6.19* 6.37* 8.97*  CALCIUM 8.4*   < > 8.8* 8.9 8.9  PROT 7.1  --   --   --   --   BILITOT 1.1  --   --   --   --   ALKPHOS 109  --   --   --   --   ALT 127*  --   --   --   --   AST 91*  --   --   --   --   GLUCOSE 86   < > 87 95 83   < > = values in this interval not displayed.   2/2 - U/A: Pending 2/3 -  Lipase: 88 2/3 - Protime/INR: 15.9/16.8 2/5 - Hep C Panel: HCV Ab <0.1 2/6 - Albumin: 2.7 2/6 - Anti-smooth muscle: 17 (negative) 2/6 - ANA w/reflex: Negative 2/6 - Alpha-1-antitrypsin: 144 (normal) 2/6 - Mitochondrial antibody: <20.0 (negative)  Imaging/Diagnostic Tests:  2/3 - CXR:  FINDINGS: Cardiomegaly with pulmonary vascular congestion. Very mild interstitial edema is possible, although this is improved from the prior. Suspected trace bilateral pleural effusions. No pneumothorax.  IMPRESSION: Cardiomegaly with possible mild interstitial edema, although improved from the prior.  Trace bilateral pleural effusions.  2/3 - Abd X-ray: FINDINGS: Nonobstructive bowel gas pattern.  Multiple air-fluid levels on this single upright abdominal radiograph, raising the possibility of small bowel enteritis.  No evidence of free air under the diaphragm on the upright view.  IMPRESSION: No evidence of  small bowel obstruction or free air.  Multiple air-fluid levels, raising the possibility of small bowel Enteritis.  2/4 - KUB: IMPRESSION: Oral contrast present for CT. No suspicion of ileus, obstruction or free air.  2/6 - Abd U/S: IMPRESSION: 1. Peripancreatic/left upper quadrant fluid collection as seen on CT, possibly a pseudocyst. 2. Gallbladder wall thickening, pericholecystic fluid, and positive sonographic Murphy sign. No gallstones. Acalculus cholecystitis is possible in the appropriate clinical setting. 3. Severely atrophic native kidneys.  Failed renal transplant.  2/6 - ECHO: Impressions: - The LV function is markedly reduced. Grade 3 diastolic dysfunction there is a small - moderate sized pericardial effusion but no evidence of RA or RV collapse.   Nuala Alpha, DO 09/02/2017, 7:27 AM PGY-1, [place] Intern pager: [phone], text pages welcome

## 2017-09-02 NOTE — Progress Notes (Signed)
HD tx ended 30 min early @ 57 at pt's request, UF goal not met, blood rinsed back VSS, report called to Alba Cory, RN

## 2017-09-02 NOTE — Progress Notes (Signed)
Pt with none compliance issues stepped off the unit to get himself a sandwich. Did not want to wait for lunch stating that he is hungry

## 2017-09-02 NOTE — Progress Notes (Addendum)
[place] for Heparin Indication: h/o VTE  Allergies  Allergen Reactions  . Butalbital-Apap-Caffeine Shortness Of Breath, Swelling and Other (See Comments)    Swelling in throat  . Ferrlecit [Na Ferric Gluc Cplx In Sucrose] Shortness Of Breath, Swelling and Other (See Comments)    Swelling in throat, tolerates Venofor  . Minoxidil Shortness Of Breath  . Tylenol [Acetaminophen] Anaphylaxis and Swelling  . Darvocet [Propoxyphene N-Acetaminophen] Hives    Patient Measurements: Height: _0  (188 cm) Weight: 168 lb 6.9 oz (76.4 kg) IBW/kg (Calculated) : 82.2 Heparin Dosing Weight: 75.1 kg  Vital Signs: Temp: 98.2 F (36.8 C) (02/12 0558) Temp Source: Oral (02/12 0558) BP: 132/87 (02/12 0558) Pulse Rate: 75 (02/12 0558)  Labs: Recent Labs    08/31/17 1011 08/31/17 2130 09/01/17 1142  HGB  --   --  10.2*  HCT  --   --  30.9*  PLT  --   --  150  LABPROT 15.9*  --  16.6*  INR 1.28  --  1.35  HEPARINUNFRC  --  0.15* 0.90*  CREATININE 6.37*  --  8.97*    Estimated Creatinine Clearance: 10.3 mL/min (A) (by C-G formula based on SCr of 8.97 mg/dL (H)).   Assessment: On Coumadin for hx of DVTs. INR on admit is low at 1.29 with hx noncompliance. Patient currently on IV heparin.   Rate was decreased yesterday due to supratherapeutic level but patient refused labs this morning so unable to assess heparin level. No bleeding noted.   Spoke with patient and he states he just asked phlebotomy to come back in 10 min. He agreed for them to come draw labs now. Spoke with Caryl Pina from phlebotomy and she will attempt again. She states 3 people tried this morning.  - Coumadin PTA-unsure of home dose (med rec says 7.47m x 2 then see Coumadin pharmacist)  Goal of Therapy:  Heparin level 0.3-0.7 units/ml Monitor platelets by anticoagulation protocol: Yes   Plan:  1. Continue heparin infusion at 1200 units/hr 2. Phlebotomy attempting to draw labs for  this morning 3. Daily heparin level and CBC 4. Monitor for s/sx of bleeding   JRenold Genta PharmD, BCPS Clinical Pharmacist Phone for today - [place]- [phone]/06/2018 10:29 AM    ADDENDUM Level drawn this afternoon was undetectable. Per RN, no issues with line/infusion.  Plan: Increase heparin infusion to 1350 units/hr Recheck heparin level this evening if patient does not refuse Daily heparin level and CBC  Patton Swisher D. Keelon Zurn, PharmD, BCPS Clinical Pharmacist Clinical Phone for 09/02/2017 until 3:30pm: [phone] after 3:30pm, please call main pharmacy at [phone] 09/02/2017 1:45 PM

## 2017-09-02 NOTE — Progress Notes (Signed)
Notified pharmacist,Karen, that pt refused am heparin level. Santiago Glad will notify the pharmacist dosing the heparin.

## 2017-09-02 NOTE — Progress Notes (Signed)
[facility] Progress Note   Subjective: sitting up on side of bed getting labs drawn. Still C/O abdominal tenderness. No SOB. Says he has been walking in halls.   Objective       Vitals:   09/02/17 0100 09/02/17 0130 09/02/17 0157 09/02/17 0558  BP: 109/72 130/78 132/72 132/87  Pulse: 67 68 64 75  Resp: 16 (!) _0 Temp:   98.9 F (37.2 C) 98.2 F (36.8 C)  TempSrc:   Oral Oral  SpO2: 99% 98% 98% 98%  Weight:   76.4 kg (168 lb 6.9 oz) 76.4 kg (168 lb 6.9 oz)  Height:       Physical Exam General: WNWD male in NAD Heart:S1,S2 RRR. No JVD at 30 degrees.  Lungs: CTAB A/P Abdomen: still tender upper quads. Active BS.  Extremities: No LE edema.  Dialysis Access: LFA  AVF + bruit   Additional Objective Labs: Basic Metabolic Panel: LastLabs         Recent Labs  Lab 08/27/17 2159  08/31/17 1011 09/01/17 1142 09/02/17 1030  NA 137   < > 137 137 135  K 5.2*   < > 5.1 4.4 6.2*  CL 100*   < > 99* 98* 100*  CO2 21*   < > 21* 23 22  GLUCOSE 119*   < > 95 83 86  BUN 78*   < > 36* 58* 37*  CREATININE 10.32*   < > 6.37* 8.97* 6.94*  CALCIUM 8.1*   < > 8.9 8.9 8.6*  PHOS 6.6*  --   --   --   --    < > = values in this interval not displayed.     Liver Function Tests: LastLabs      Recent Labs  Lab 08/27/17 2159 08/28/17 0651  AST  --  91*  ALT  --  127*  ALKPHOS  --  109  BILITOT  --  1.1  PROT  --  7.1  ALBUMIN 2.7* 2.8*     LastLabs  No results for input(s): LIPASE, AMYLASE in the last 168 hours.   CBC: LastLabs        Recent Labs  Lab 08/27/17 2159 08/28/17 0651 08/29/17 0742 09/01/17 1142  WBC 3.5* 3.6* 3.0* 3.7*  HGB 9.9* 10.3* 10.7* 10.2*  HCT 30.1* 31.9* 33.3* 30.9*  MCV 86.7 87.9 89.5 87.0  PLT 132* 109* 124* 150     Blood Culture Labs(Brief)           Component Value Date/Time   SDES BLOOD RIGHT HAND 03/10/2017 0438   SPECREQUEST  03/10/2017 0438    BOTTLES DRAWN AEROBIC AND  ANAEROBIC Blood Culture adequate volume   CULT NO GROWTH 5 DAYS 03/10/2017 0438   REPTSTATUS 03/15/2017 FINAL 03/10/2017 0438      Cardiac Enzymes: LastLabs  No results for input(s): CKTOTAL, CKMB, CKMBINDEX, TROPONINI in the last 168 hours.   CBG: LastLabs      Recent Labs  Lab 08/27/17 0735 08/27/17 2331  GLUCAP 156* 109*     Iron Studies:  RecentLabs(last2labs)  No results for input(s): IRON, TIBC, TRANSFERRIN, FERRITIN in the last 72 hours.   _1 @ Studies/Results: ImagingResults(Last48hours)  No results found.   Medications: . heparin 1,200 Units/hr (09/02/17 0231)   . amitriptyline  10 mg Oral QHS  . amLODipine  10 mg Oral Daily  . atorvastatin  10 mg Oral q1800  . carvedilol  25 mg Oral BID WC  . doxazosin  4 mg Oral QHS  . doxercalciferol  8 mcg Intravenous Q M,W,F-HD  . feeding supplement (NEPRO CARB STEADY)  237 mL Oral TID WC  . gabapentin  100 mg Oral QHS  . isosorbide mononitrate  60 mg Oral Daily  . pantoprazole  40 mg Oral BID  . polyethylene glycol  17 g Oral Daily     Dialysis:[facility] 4h 78kg 2/2.25 bath Hep none L AVF Hectorol 29mg IV TIW Venofer 525mIV q week Mircera 1501mq 2 weeks (last 1/25)   Impression: 1 Abd pain / recur pancreatitis/Ab normal liver imaging - Loculated pseudocyst/ peripancreatic fluid collection - planning transfer to [facility] for endoscopic drainage when bed available. Says he is going tomorrow.  2 Reduced EF 20-25% onecho. Plans for F/U with Dr. BenHaroldine Laws. Continue coreg and imdur.  2 ESRD HD MWF. HD tomorrow 1st shift.  3 Vol overload- HD 09/01/17 Pre wt 81.7 Net UF 5279 Post wt 76.4kg. High gains while in hospital-needs to adhere for fluid restrictions.  lower dry at dc 4 Anemia Hb 10.2, Give Aranesp 60 mcg IV with HD tomorrow. Follow HGB>  5 HTN: BP well controlled. Continue current meds.  6 Hx DVT on heparin gtt per pharmacy.  7. BMD: Ca 8.6 No recent phos.  Add renal function panel to today's labs. Resume sensipar and binders. 8. Nutrition: Albumin 2.8. Renal diet-disc fld restrictions with pt. Add prostat/renal vits.    Frank [name] H. Frank Selsor NP-C 09/02/2017, 12:16 PM  [phone]

## 2017-09-03 DIAGNOSIS — R1013 Epigastric pain: Secondary | ICD-10-CM

## 2017-09-03 LAB — CBC
HCT: 31.8 % — ABNORMAL LOW (ref 39.0–52.0)
HCT: 31.9 % — ABNORMAL LOW (ref 39.0–52.0)
HEMOGLOBIN: 10.3 g/dL — AB (ref 13.0–17.0)
Hemoglobin: 10.5 g/dL — ABNORMAL LOW (ref 13.0–17.0)
MCH: 28.1 pg (ref 26.0–34.0)
MCH: 28.8 pg (ref 26.0–34.0)
MCHC: 32.4 g/dL (ref 30.0–36.0)
MCHC: 32.9 g/dL (ref 30.0–36.0)
MCV: 86.9 fL (ref 78.0–100.0)
MCV: 87.4 fL (ref 78.0–100.0)
Platelets: 141 10*3/uL — ABNORMAL LOW (ref 150–400)
Platelets: 143 K/uL — ABNORMAL LOW (ref 150–400)
RBC: 3.66 MIL/uL — AB (ref 4.22–5.81)
RBC: 3.65 MIL/uL — ABNORMAL LOW (ref 4.22–5.81)
RDW: 18.5 % — ABNORMAL HIGH (ref 11.5–15.5)
RDW: 18.6 % — ABNORMAL HIGH (ref 11.5–15.5)
WBC: 3.7 10*3/uL — AB (ref 4.0–10.5)
WBC: 4.1 K/uL (ref 4.0–10.5)

## 2017-09-03 LAB — RENAL FUNCTION PANEL
Albumin: 2.8 g/dL — ABNORMAL LOW (ref 3.5–5.0)
Anion gap: 16 — ABNORMAL HIGH (ref 5–15)
BUN: 53 mg/dL — ABNORMAL HIGH (ref 6–20)
CO2: 22 mmol/L (ref 22–32)
Calcium: 8.5 mg/dL — ABNORMAL LOW (ref 8.9–10.3)
Chloride: 97 mmol/L — ABNORMAL LOW (ref 101–111)
Creatinine, Ser: 8.97 mg/dL — ABNORMAL HIGH (ref 0.61–1.24)
GFR calc Af Amer: 7 mL/min — ABNORMAL LOW (ref >60)
GFR calc non Af Amer: 6 mL/min — ABNORMAL LOW (ref >60)
Glucose, Bld: 105 mg/dL — ABNORMAL HIGH (ref 65–99)
Phosphorus: 6.7 mg/dL — ABNORMAL HIGH (ref 2.5–4.6)
Potassium: 4.7 mmol/L (ref 3.5–5.1)
Sodium: 135 mmol/L (ref 135–145)

## 2017-09-03 LAB — HEPARIN LEVEL (UNFRACTIONATED): Heparin Unfractionated: <0.1 IU/mL — ABNORMAL LOW (ref 0.30–0.70)

## 2017-09-03 LAB — PROTIME-INR
INR: 1.13
Prothrombin Time: 14.4 seconds (ref 11.4–15.2)

## 2017-09-03 MED ORDER — LIDOCAINE-PRILOCAINE 2.5-2.5 % EX CREA: 1.0000 "application " | TOPICAL_CREAM | CUTANEOUS | Status: DC | PRN

## 2017-09-03 MED ORDER — DOXERCALCIFEROL 4 MCG/2ML IV SOLN
INTRAVENOUS | Status: AC
  Filled 2017-09-03: qty 4

## 2017-09-03 MED ORDER — SODIUM CHLORIDE 0.9 % IV SOLN: 100.0000 mL | INTRAVENOUS | Status: DC | PRN

## 2017-09-03 MED ORDER — HEPARIN SODIUM (PORCINE) 1000 UNIT/ML DIALYSIS: 1000.0000 [IU] | INTRAMUSCULAR | Status: DC | PRN

## 2017-09-03 MED ORDER — PENTAFLUOROPROP-TETRAFLUOROETH EX AERO: 1.0000 "application " | INHALATION_SPRAY | CUTANEOUS | Status: DC | PRN

## 2017-09-03 MED ORDER — HYDROMORPHONE HCL 1 MG/ML IJ SOLN
INTRAMUSCULAR | Status: AC
  Administered 2017-09-03: 1 mg via INTRAVENOUS
  Filled 2017-09-03: qty 1

## 2017-09-03 MED ORDER — LIDOCAINE HCL (PF) 1 % IJ SOLN: 5.0000 mL | INTRAMUSCULAR | Status: DC | PRN

## 2017-09-03 MED ORDER — DARBEPOETIN ALFA 60 MCG/0.3ML IJ SOSY
PREFILLED_SYRINGE | INTRAMUSCULAR | Status: AC
  Filled 2017-09-03: qty 0.3

## 2017-09-03 MED ORDER — ALTEPLASE 2 MG IJ SOLR: 2.0000 mg | Freq: Once | INTRAMUSCULAR | Status: DC | PRN

## 2017-09-03 NOTE — Progress Notes (Signed)
Frank [name]   Subjective:    End stage renal disease was admitted for abdominal pain and recurrent pancreatitis and is being transferred to [facility] today for endoscopic drainage of a large loculated pseudocyst.  Patient was seen on dialysis this morning  No complaints    Objective:  Vital signs in last 24 hours:  Temp:  [97.8 F (36.6 C)-98.5 F (36.9 C)] 97.8 F (36.6 C) (02/13 0829) Pulse Rate:  [68-78] 75 (02/13 1030) Resp:  [10-19] 11 (02/13 1030) BP: (113-151)/(69-105) 131/79 (02/13 1030) SpO2:  [96 %-97 %] 97 % (02/13 0829) Weight:  [176 lb 9.4 oz (80.1 kg)] 176 lb 9.4 oz (80.1 kg) (02/13 0829)  Weight change:  Filed Weights   09/02/17 0157 09/02/17 0558 09/03/17 0829  Weight: 168 lb 6.9 oz (76.4 kg) 168 lb 6.9 oz (76.4 kg) 176 lb 9.4 oz (80.1 kg)    Intake/Output: I/O last 3 completed shifts: In: 1182 [P.O.:1014; I.V.:168] Out: 4196 [Other:5279]   Intake/Output this shift:  No intake/output data recorded.  General:WDWN male Facial edema and jvd better today Head:NCAT sclera not icteric  Lungs:clear bilat Heart:RRR with S1 S2 Abdomen:distended +tender to mild palpation Lower extremities:no LE edema Dialysis Access:LUE AVF +thrill     Basic Metabolic Panel: Recent Labs  Lab 08/27/17 2159  08/29/17 0742 08/30/17 0433 08/31/17 1011 09/01/17 1142 09/02/17 1030 09/02/17 1702  NA 137   < > 137 137 137 137 135  --   K 5.2*   < > 4.9 4.5 5.1 4.4 6.2*  --   CL 100*   < > 97* 98* 99* 98* 100*  --   CO2 21*   < > 23 25 21* 23 22  --   GLUCOSE 119*   < > 98 87 95 83 86  --   BUN 78*   < > 52* 34* 36* 58* 37*  --   CREATININE 10.32*   < > 8.58* 6.19* 6.37* 8.97* 6.94*  --   CALCIUM 8.1*   < > 8.7* 8.8* 8.9 8.9 8.6*  --   PHOS 6.6*  --   --   --   --   --   --  6.3*   < > = values in this interval not displayed.    Liver Function Tests: Recent Labs  Lab 08/27/17 2159 08/28/17 0651  AST  --  91*  ALT  --  127*   ALKPHOS  --  109  BILITOT  --  1.1  PROT  --  7.1  ALBUMIN 2.7* 2.8*   No results for input(s): LIPASE, AMYLASE in the last 168 hours. No results for input(s): AMMONIA in the last 168 hours.  CBC: Recent Labs  Lab 08/28/17 0651 08/29/17 0742 09/01/17 1142 09/03/17 0615 09/03/17 1000  WBC 3.6* 3.0* 3.7* 3.7* 4.1  HGB 10.3* 10.7* 10.2* 10.3* 10.5*  HCT 31.9* 33.3* 30.9* 31.8* 31.9*  MCV 87.9 89.5 87.0 86.9 87.4  PLT 109* 124* 150 141* 143*    Cardiac Enzymes: No results for input(s): CKTOTAL, CKMB, CKMBINDEX, TROPONINI in the last 168 hours.  BNP: Invalid input(s): POCBNP  CBG: Recent Labs  Lab 08/27/17 2331  GLUCAP 109*    Microbiology: Results for orders placed or performed during the hospital encounter of 08/08/17  MRSA PCR Screening     Status: None   Collection Time: 08/09/17  6:59 PM  Result Value Ref Range Status   MRSA by PCR NEGATIVE NEGATIVE Final    Comment:  The GeneXpert MRSA Assay (FDA approved for NASAL specimens only), is one component of a comprehensive MRSA colonization surveillance program. It is not intended to diagnose MRSA infection nor to guide or monitor treatment for MRSA infections.     Coagulation Studies: Recent Labs    09/01/17 1142 09/03/17 0615  LABPROT 16.6* 14.4  INR 1.35 1.13    Urinalysis: No results for input(s): COLORURINE, LABSPEC, PHURINE, GLUCOSEU, HGBUR, BILIRUBINUR, KETONESUR, PROTEINUR, UROBILINOGEN, NITRITE, LEUKOCYTESUR in the last 72 hours.  Invalid input(s): APPERANCEUR    Imaging: No results found.   Medications:   . sodium chloride    . sodium chloride    . heparin 1,350 Units/hr (09/03/17 0348)   . amitriptyline  10 mg Oral QHS  . amLODipine  10 mg Oral Daily  . atorvastatin  10 mg Oral q1800  . carvedilol  25 mg Oral BID WC  . cinacalcet  90 mg Oral Q supper  . Darbepoetin Alfa      . darbepoetin (ARANESP) injection - DIALYSIS  60 mcg Intravenous Q Wed-HD  . doxazosin  4 mg  Oral QHS  . doxercalciferol      . doxercalciferol  8 mcg Intravenous Q M,W,F-HD  . feeding supplement (NEPRO CARB STEADY)  237 mL Oral TID WC  . feeding supplement (PRO-STAT SUGAR FREE 64)  30 mL Oral BID  . gabapentin  100 mg Oral QHS  . isosorbide mononitrate  60 mg Oral Daily  . multivitamin  1 tablet Oral QHS  . pantoprazole  40 mg Oral BID  . polyethylene glycol  17 g Oral Daily  . sevelamer carbonate  2,400 mg Oral TID WC   sodium chloride, sodium chloride, alteplase, ammonium lactate, heparin, HYDROmorphone (DILAUDID) injection, hydrOXYzine, lidocaine (PF), lidocaine-prilocaine, ondansetron **OR** ondansetron (ZOFRAN) IV, pentafluoroprop-tetrafluoroeth, senna-docusate  Assessment/ Plan:  1 Abd pain / recur pancreatitis/Ab normal liver imaging - Loculated pseudocyst/ peripancreatic fluid collection - planning transfer to [facility] for endoscopic drainage when bed available  2 Reduced EF 20-25% onecho 2 ESRD HD MWF.  3 Vol overload-  Continues to be challenged on dialysis 4 Anemia Hb 10.7 , follow 5 HTN  6 Hx DVT on coumadin  Still waiting on availability of bed from [facility]   LOS: 10 Berdine Rasmusson W _0 _1 :52 AM

## 2017-09-03 NOTE — Progress Notes (Signed)
[place] for Heparin Indication: h/o VTE  Allergies  Allergen Reactions  . Butalbital-Apap-Caffeine Shortness Of Breath, Swelling and Other (See Comments)    Swelling in throat  . Ferrlecit [Na Ferric Gluc Cplx In Sucrose] Shortness Of Breath, Swelling and Other (See Comments)    Swelling in throat, tolerates Venofor  . Minoxidil Shortness Of Breath  . Tylenol [Acetaminophen] Anaphylaxis and Swelling  . Darvocet [Propoxyphene N-Acetaminophen] Hives    Patient Measurements: Height: _0  (188 cm) Weight: 171 lb 1.2 oz (77.6 kg) IBW/kg (Calculated) : 82.2 Heparin Dosing Weight: 75.1 kg  Vital Signs: Temp: 98.6 F (37 C) (02/13 1431) Temp Source: Oral (02/13 1431) BP: 147/96 (02/13 1852) Pulse Rate: 84 (02/13 1852)  Labs: Recent Labs    09/01/17 1142 09/02/17 1030 09/02/17 1204 09/03/17 0615 09/03/17 1000 09/03/17 1838  HGB 10.2*  --   --  10.3* 10.5*  --   HCT 30.9*  --   --  31.8* 31.9*  --   PLT 150  --   --  141* 143*  --   LABPROT 16.6*  --   --  14.4  --   --   INR 1.35  --   --  1.13  --   --   HEPARINUNFRC 0.90*  --  <0.10* <0.10*  --  <0.10*  CREATININE 8.97* 6.94*  --   --  8.97*  --     Estimated Creatinine Clearance: 10.5 mL/min (A) (by C-G formula based on SCr of 8.97 mg/dL (H)).   Assessment: 52 yo M with DVT, on IV heparin while coumadin is on hold. Heparin level is still undetectable after rate increased to 1450 units/hr, but the level was drawn only 4 hrs after actual rate increase. No bleeding, no issue with lines, no interruption with infusion per RN.   Goal of Therapy:  Heparin level 0.3-0.7 units/ml Monitor platelets by anticoagulation protocol: Yes   Plan:  Increase heparin gtt to 1550 units/hr F/u AM heparin leval  Maryanna Shape, PharmD, BCPS  Clinical Pharmacist  Pager: [phone]   09/03/2017 8:31 PM

## 2017-09-03 NOTE — Progress Notes (Signed)
[facility] for Heparin Indication: h/o VTE  Allergies  Allergen Reactions  . Butalbital-Apap-Caffeine Shortness Of Breath, Swelling and Other (See Comments)    Swelling in throat  . Ferrlecit [Na Ferric Gluc Cplx In Sucrose] Shortness Of Breath, Swelling and Other (See Comments)    Swelling in throat, tolerates Venofor  . Minoxidil Shortness Of Breath  . Tylenol [Acetaminophen] Anaphylaxis and Swelling  . Darvocet [Propoxyphene N-Acetaminophen] Hives    Patient Measurements: Height: _0  (188 cm) Weight: 168 lb 6.9 oz (76.4 kg) IBW/kg (Calculated) : 82.2 Heparin Dosing Weight: 75.1 kg  Vital Signs: Temp: 98.4 F (36.9 C) (02/12 2126) Temp Source: Oral (02/12 2126) BP: 139/105 (02/13 0729) Pulse Rate: 71 (02/13 0729)  Labs: Recent Labs    08/31/17 1011  09/01/17 1142 09/02/17 1030 09/02/17 1204 09/03/17 0615  HGB  --   --  10.2*  --   --  10.3*  HCT  --   --  30.9*  --   --  31.8*  PLT  --   --  150  --   --  141*  LABPROT 15.9*  --  16.6*  --   --  14.4  INR 1.28  --  1.35  --   --  1.13  HEPARINUNFRC  --    < > 0.90*  --  <0.10* <0.10*  CREATININE 6.37*  --  8.97* 6.94*  --   --    < > = values in this interval not displayed.    Estimated Creatinine Clearance: 13.3 mL/min (A) (by C-G formula based on SCr of 6.94 mg/dL (H)).   Assessment: 52 yo M on Coumadin (med rec says 7.53m x 2 then see Coumadin pharmacist) for hx of DVTs. INR on admit is low at 1.29. INR today down to 1.13 as warfarin has been on hold since last dose 2/5 for abdominal procedure. Heparin level undetectable again this morning. No issues with gtt documented. Refusing several sticks, difficult to monitor on heparin gtt. Hgb 10.3, plts 141.   Goal of Therapy:  Heparin level 0.3-0.7 units/ml Monitor platelets by anticoagulation protocol: Yes   Plan:  Increase heparin gtt to 1,450 units/hr Monitor daily heparin level, CBC, s/s of bleed Check heparin level  later today  NElenor Quinones PharmD, BPike County Memorial HospitalClinical Pharmacist Pager [phone]/13/2019 8:16 AM

## 2017-09-03 NOTE — Progress Notes (Signed)
[name]: [phone]  Patient name: Frank [name] Medical record number: [id number] Date of birth: 09-May-1966 Age: 52 y.o. Gender: male  Primary Care Provider: No primary care provider on file. Consultants: Nephrology, GI Code Status: Full  Pt Overview and Major Events to Date:  Frank [name] is a 52 y.o. male presenting with abdominal pain thought to be secondary to acute on chronic pancreatitis. PMH is significant for PMH is significant forESRD on MWF HD, HTN, HFrEF, NICM,DVT/PE on Coumadin, anxiety and depression, chronic pain syndrome, anemia of chronic disease, GERD, chronic pancreatitis, HLD, OSA.  Assessment and Plan:  Abdominal Pain from acute on chronic pancreatitis with enlarging pseudocyst: Continued abdominal pain with stable vitals. Getting the PRN Dilaudid every 3 hours. Accepting physician will be Dr. Oliver Hum of [facility] for EUS guided drainage of large peripancreatic pseudocyst which is causing compression of the stomach (no one in [place] performs this procedure). No bed available today. Will try to reach out to other academic centers to see if bed is available elsewhere. - Gastroenterology following; appreciate recs - Zofran prn for nausea - Diet renal restrictive diet - Dilaudid 93m q3 PRN secondary to uncontrolled pain, Tylenol prn for pain control - Cont Protonix 481mBID - Start bowel regimen of senna prn with scheduled miralax and docusate.   HFrEF / NICM: Clinically unchanged but ECHO results show acute changes. ECHO 08/27/2017 shows EF 20-25%, global hypokinesis, severe LAE, mild mitral regurg. Does not appear volume overloaded, currently receiving HD. Questionable PND vs sleep apnea at home. Does have Cardiomegaly. CT Abd did show enlarging pericardial effusion with right pleural effusion and dependent atelectasis in the right lung. -  Dr. BeHaroldine Lawso see in outpatient HF Clinic  - Cont Coreg 2581mid and Imdur  11m39mily - Fluid status management with HD - Patient cleared for EUS procedure from Cardiac standpoint  Possible Cirrohsis: Acute. Patient does not endorse history of ETOH consumption. Hepatitis C panel negative. In light of pancreatic pseudocyst the diagnosis of cirrhosis is questionable. - GI on board; appreciate recs. Actual diagnosis of cirrhosis may have been premature. As it looks like his worsening pancreatic pseuodcyst is a much better explanation for his current symptoms. See note on abdominal pain. - Cirrhosis labs (mitochondrial antibody, alpha-1-antitrypsin, ANA, anti-smooth muscle antibody) negative  ESRD on MWF HD[facility] Has not missed any HD sessions recently per patient. Currently receiving HD.  - Nephro managing HD; greatly appreciate their recs and management   HTN: Controlled. Patient has mostly been at goal over the past 24hrs on home regimen SBP 113-151.  - Cont home Norvasc 10mg54mly, Coreg 25mg 20m Doxazosin 4mg qh46m Consider restarting Hydralazine 100mg da23mif BP becomes consistently elevated  Hx DVT/PE: Has had multiple DVTs in the past. On Coumadin at home. - Coumadin stopped per GI due to pending EUS procedure.  - Heparin per pharm  Anxiety/depression: Chronic, stable. Denies any feelings of depression on admission. - Cont Amitriptyline  Chronic Pain Syndrome: Chronic. Cont home meds - cont Amitriptyline 10mg and51mapentin 100mg - cu69mtly on PRN dilaudid for abdominal pain related to pancreatic pseudocyst  Anemia of Chronic Disease: Likely related to CKD. Hgb 10.7 on admission now at 10.5 (baseline ~10) - cont to trend Hgb  HLD: Last lipid panel 08/09/17 with Chol 92, HDL 24, LDL 53, and TG 77. - Cont home Lipitor 10mg  OSA:38monic. Stable. - CPAP qhs  GERD: Chronic. Stable. - Restart home Protonix 34m bid until able to take PO  FEN/GI: Advance diet as tolerated PPx: Coumadin for  DVT/PD  Disposition[facility] center [facility]  Subjective:  Patient upset he had to be in dialysis this morning, was afraid it would hold up his transfer.  Objective: Temp:  [97.8 F (36.6 C)-98.5 F (36.9 C)] 98.3 F (36.8 C) (02/13 1254) Pulse Rate:  [68-79] 79 (02/13 1254) Resp:  [10-19] 12 (02/13 1254) BP: (113-151)/(69-105) 146/88 (02/13 1254) SpO2:  [96 %-97 %] 97 % (02/13 1254) Weight:  [176 lb 9.4 oz (80.1 kg)] 176 lb 9.4 oz (80.1 kg) (02/13 0829)  Physical Exam: Gen: Alert and Oriented x 3, NAD HEENT: Normocephalic, atraumatic, PERRLA, EOMI, discolored sclerae CV: RRR, no murmurs, rubs or gallops Resp: CTAB, no wheezing, rales, or rhonchi, comfortable work of breathing Abd: tender to light palpation diffusely, mostly epigastric. Firm but not distended. +bs in all four quadrants Skin: warm, dry, intact, no rashes   Laboratory: Recent Labs  Lab 09/01/17 1142 09/03/17 0615 09/03/17 1000  WBC 3.7* 3.7* 4.1  HGB 10.2* 10.3* 10.5*  HCT 30.9* 31.8* 31.9*  PLT 150 141* 143*   Recent Labs  Lab 08/28/17 0651  09/01/17 1142 09/02/17 1030 09/03/17 1000  NA 137   < > 137 135 135  K 4.1   < > 4.4 6.2* 4.7  CL 97*   < > 98* 100* 97*  CO2 25   < > _0 BUN 32*   < > 58* 37* 53*  CREATININE 6.40*   < > 8.97* 6.94* 8.97*  CALCIUM 8.4*   < > 8.9 8.6* 8.5*  PROT 7.1  --   --   --   --   BILITOT 1.1  --   --   --   --   ALKPHOS 109  --   --   --   --   ALT 127*  --   --   --   --   AST 91*  --   --   --   --   GLUCOSE 86   < > 83 86 105*   < > = values in this interval not displayed.   2/2 - U/A: Pending 2/3 - Lipase: 88 2/3 - Protime/INR: 15.9/16.8 2/5 - Hep C Panel: HCV Ab <0.1 2/6 - Albumin: 2.7 2/6 - Anti-smooth muscle: 17 (negative) 2/6 - ANA w/reflex: Negative 2/6 - Alpha-1-antitrypsin: 144 (normal) 2/6 - Mitochondrial antibody: <20.0 (negative)  Imaging/Diagnostic  Tests:  2/3 - CXR:  FINDINGS: Cardiomegaly with pulmonary vascular congestion. Very mild interstitial edema is possible, although this is improved from the prior. Suspected trace bilateral pleural effusions. No pneumothorax.  IMPRESSION: Cardiomegaly with possible mild interstitial edema, although improved from the prior.  Trace bilateral pleural effusions.  2/3 - Abd X-ray: FINDINGS: Nonobstructive bowel gas pattern.  Multiple air-fluid levels on this single upright abdominal radiograph, raising the possibility of small bowel enteritis.  No evidence of free air under the diaphragm on the upright view.  IMPRESSION: No evidence of small bowel obstruction or free air.  Multiple air-fluid levels, raising the possibility of small bowel Enteritis.  2/4 - KUB: IMPRESSION: Oral contrast present for CT. No suspicion of ileus, obstruction or free air.  2/6 - Abd U/S: IMPRESSION: 1. Peripancreatic/left upper quadrant fluid collection as seen on CT, possibly a pseudocyst. 2. Gallbladder wall thickening, pericholecystic fluid,  and positive sonographic Murphy sign. No gallstones. Acalculus cholecystitis is possible in the appropriate clinical setting. 3. Severely atrophic native kidneys.  Failed renal transplant.  2/6 - ECHO: Impressions: - The LV function is markedly reduced. Grade 3 diastolic dysfunction there is a small - moderate sized pericardial effusion but no evidence of RA or RV collapse.   Rory Percy, DO 09/03/2017, 2:09 PM PGY-1, [place] Intern pager: [phone], text pages welcome

## 2017-09-03 NOTE — Progress Notes (Signed)
Received a call from [facility] stating that patient still does not have a bed ready. Soon after I received a call from Dialysis requesting that patient come down early per MD request to receive treatment. By the time I could go into room to explain news to patient transport arrived to floor to pick up patient. Transport then called to tell me that patient refused to go to dialysis and he wasn't going until he heard if he had a bed at [facility]. I went into patients room to clarify that [facility] called me and stated that patient was still waiting on a bed. Patient got very angry at me stating "look I don't need you telling me about no bed. You have no right and that's not your place". I replied to patient [facility] called me personally to tell me and that as a nurse we do have a place to inform of that. He then stated that "no what you should have been doing was bringing me my pain medicine when it was due!" I asked patient did he call me for his medicine and he replied no, you know when its due. I have explained to patient several times that he needs to call me when he needs his medication because its prn. Patient doesn't seem to understand the protocol of prn medications and expects the nurse to just bring it every 3 hours. I told patient that if he does not call then I don't know if he wants his pain medication. Patient refused to go to dialysis until later today after he spoke with physician. Patient has been very short, difficult to get along with, and non-compliant with staff all night.

## 2017-09-04 DIAGNOSIS — Z888 Allergy status to other drugs, medicaments and biological substances status: Secondary | ICD-10-CM | POA: Diagnosis not present

## 2017-09-04 DIAGNOSIS — Z940 Kidney transplant status: Secondary | ICD-10-CM | POA: Diagnosis not present

## 2017-09-04 DIAGNOSIS — Z7901 Long term (current) use of anticoagulants: Secondary | ICD-10-CM | POA: Diagnosis not present

## 2017-09-04 DIAGNOSIS — I313 Pericardial effusion (noninflammatory): Secondary | ICD-10-CM | POA: Diagnosis not present

## 2017-09-04 DIAGNOSIS — I132 Hypertensive heart and chronic kidney disease with heart failure and with stage 5 chronic kidney disease, or end stage renal disease: Secondary | ICD-10-CM | POA: Diagnosis present

## 2017-09-04 DIAGNOSIS — I5040 Unspecified combined systolic (congestive) and diastolic (congestive) heart failure: Secondary | ICD-10-CM | POA: Diagnosis not present

## 2017-09-04 DIAGNOSIS — N2889 Other specified disorders of kidney and ureter: Secondary | ICD-10-CM | POA: Diagnosis not present

## 2017-09-04 DIAGNOSIS — G4730 Sleep apnea, unspecified: Secondary | ICD-10-CM | POA: Diagnosis present

## 2017-09-04 DIAGNOSIS — K219 Gastro-esophageal reflux disease without esophagitis: Secondary | ICD-10-CM | POA: Diagnosis present

## 2017-09-04 DIAGNOSIS — Z79899 Other long term (current) drug therapy: Secondary | ICD-10-CM | POA: Diagnosis not present

## 2017-09-04 DIAGNOSIS — T8612 Kidney transplant failure: Secondary | ICD-10-CM | POA: Diagnosis present

## 2017-09-04 DIAGNOSIS — F419 Anxiety disorder, unspecified: Secondary | ICD-10-CM | POA: Diagnosis present

## 2017-09-04 DIAGNOSIS — K863 Pseudocyst of pancreas: Secondary | ICD-10-CM | POA: Diagnosis not present

## 2017-09-04 DIAGNOSIS — J449 Chronic obstructive pulmonary disease, unspecified: Secondary | ICD-10-CM | POA: Diagnosis present

## 2017-09-04 DIAGNOSIS — K859 Acute pancreatitis without necrosis or infection, unspecified: Secondary | ICD-10-CM | POA: Diagnosis not present

## 2017-09-04 DIAGNOSIS — E875 Hyperkalemia: Secondary | ICD-10-CM | POA: Diagnosis not present

## 2017-09-04 DIAGNOSIS — R001 Bradycardia, unspecified: Secondary | ICD-10-CM | POA: Diagnosis not present

## 2017-09-04 DIAGNOSIS — Z885 Allergy status to narcotic agent status: Secondary | ICD-10-CM | POA: Diagnosis not present

## 2017-09-04 DIAGNOSIS — K8689 Other specified diseases of pancreas: Secondary | ICD-10-CM | POA: Diagnosis not present

## 2017-09-04 DIAGNOSIS — N186 End stage renal disease: Secondary | ICD-10-CM | POA: Diagnosis not present

## 2017-09-04 DIAGNOSIS — Z86718 Personal history of other venous thrombosis and embolism: Secondary | ICD-10-CM | POA: Diagnosis not present

## 2017-09-04 DIAGNOSIS — F329 Major depressive disorder, single episode, unspecified: Secondary | ICD-10-CM | POA: Diagnosis present

## 2017-09-04 DIAGNOSIS — Z992 Dependence on renal dialysis: Secondary | ICD-10-CM | POA: Diagnosis not present

## 2017-09-04 DIAGNOSIS — K861 Other chronic pancreatitis: Secondary | ICD-10-CM | POA: Diagnosis present

## 2017-09-04 DIAGNOSIS — D696 Thrombocytopenia, unspecified: Secondary | ICD-10-CM | POA: Diagnosis present

## 2017-09-04 DIAGNOSIS — I120 Hypertensive chronic kidney disease with stage 5 chronic kidney disease or end stage renal disease: Secondary | ICD-10-CM | POA: Diagnosis not present

## 2017-09-04 DIAGNOSIS — I5032 Chronic diastolic (congestive) heart failure: Secondary | ICD-10-CM | POA: Diagnosis present

## 2017-09-04 DIAGNOSIS — I10 Essential (primary) hypertension: Secondary | ICD-10-CM | POA: Diagnosis not present

## 2017-09-04 DIAGNOSIS — D631 Anemia in chronic kidney disease: Secondary | ICD-10-CM | POA: Diagnosis not present

## 2017-09-04 DIAGNOSIS — D72819 Decreased white blood cell count, unspecified: Secondary | ICD-10-CM | POA: Diagnosis present

## 2017-09-04 DIAGNOSIS — N2581 Secondary hyperparathyroidism of renal origin: Secondary | ICD-10-CM | POA: Diagnosis not present

## 2017-09-04 LAB — HEPARIN LEVEL (UNFRACTIONATED)
HEPARIN UNFRACTIONATED: 0.11 [IU]/mL — AB (ref 0.30–0.70)
Heparin Unfractionated: <0.1 IU/mL — ABNORMAL LOW (ref 0.30–0.70)

## 2017-09-04 LAB — CBC
HCT: 31.1 % — ABNORMAL LOW (ref 39.0–52.0)
Hemoglobin: 10.2 g/dL — ABNORMAL LOW (ref 13.0–17.0)
MCH: 28.5 pg (ref 26.0–34.0)
MCHC: 32.8 g/dL (ref 30.0–36.0)
MCV: 86.9 fL (ref 78.0–100.0)
PLATELETS: 127 10*3/uL — AB (ref 150–400)
RBC: 3.58 MIL/uL — ABNORMAL LOW (ref 4.22–5.81)
RDW: 18.3 % — AB (ref 11.5–15.5)
WBC: 4.2 10*3/uL (ref 4.0–10.5)

## 2017-09-04 LAB — RENAL FUNCTION PANEL
ANION GAP: 12 (ref 5–15)
Albumin: 2.6 g/dL — ABNORMAL LOW (ref 3.5–5.0)
BUN: 33 mg/dL — AB (ref 6–20)
CALCIUM: 8.5 mg/dL — AB (ref 8.9–10.3)
CO2: 25 mmol/L (ref 22–32)
Chloride: 99 mmol/L — ABNORMAL LOW (ref 101–111)
Creatinine, Ser: 6.78 mg/dL — ABNORMAL HIGH (ref 0.61–1.24)
GFR calc Af Amer: 10 mL/min — ABNORMAL LOW (ref >60)
GFR calc non Af Amer: 8 mL/min — ABNORMAL LOW (ref >60)
GLUCOSE: 117 mg/dL — AB (ref 65–99)
Phosphorus: 5.3 mg/dL — ABNORMAL HIGH (ref 2.5–4.6)
Potassium: 4.1 mmol/L (ref 3.5–5.1)
SODIUM: 136 mmol/L (ref 135–145)

## 2017-09-04 LAB — PROTIME-INR
INR: 1.28
Prothrombin Time: 15.8 seconds — ABNORMAL HIGH (ref 11.4–15.2)

## 2017-09-04 MED ORDER — PRO-STAT SUGAR FREE PO LIQD: 30.0000 mL | Freq: Two times a day (BID) | ORAL | 0 refills | Status: DC

## 2017-09-04 MED ORDER — HEPARIN BOLUS VIA INFUSION
2000.0000 [IU] | Freq: Once | INTRAVENOUS | Status: AC
  Administered 2017-09-04: 2000 [IU] via INTRAVENOUS
  Filled 2017-09-04: qty 2000

## 2017-09-04 MED ORDER — DARBEPOETIN ALFA 60 MCG/0.3ML IJ SOSY: 60.0000 ug | PREFILLED_SYRINGE | INTRAMUSCULAR | Status: DC

## 2017-09-04 MED ORDER — RENA-VITE PO TABS: 1.0000 | ORAL_TABLET | Freq: Every day | ORAL | 0 refills | Status: DC

## 2017-09-04 MED ORDER — AMMONIUM LACTATE 12 % EX LOTN: TOPICAL_LOTION | CUTANEOUS | 0 refills | Status: DC | PRN

## 2017-09-04 MED ORDER — CINACALCET HCL 30 MG PO TABS: 90.0000 mg | ORAL_TABLET | Freq: Every day | ORAL | Status: DC

## 2017-09-04 NOTE — Progress Notes (Signed)
Phoned [facility] advised them that the Pt was being loaded up and on his way towards them. Report was given to [facility] along with all paperwork.

## 2017-09-04 NOTE — Progress Notes (Signed)
Spoke with [facility] Dr. Brandon Melnick who felt that based on last CT and ultrasound that not appropriate for transfer without clear fluid collection. She recommended that we could consider MRI for better evaluation.  Bufford Lope, DO PGY-2, [facility] 09/04/2017 11:48 AM

## 2017-09-04 NOTE — Progress Notes (Signed)
[facility] Progress Note   Subjective: Awake, pleasant. Says he is eating OK, still awaiting transfer to outside facility. No C/Os.   Objective Vitals:   09/03/17 2100 09/04/17 0523 09/04/17 0755 09/04/17 0759  BP: 131/80 (!) 141/97 132/77 132/77  Pulse: 74 72 65 65  Resp: _0 Temp: 98.6 F (37 C) 98.5 F (36.9 C)    TempSrc: Oral Oral    SpO2: 100% 98%  100%  Weight:      Height:       Physical Exam General: WN,WD NAD Heart: S1,S2, RRR Lungs: CTAB A/P Abdomen: active BS Extremities: No LE edema Dialysis Access: LFA AVF + bruit.    Additional Objective Labs: Basic Metabolic Panel: Recent Labs  Lab 09/02/17 1030 09/02/17 1702 09/03/17 1000 09/04/17 0714  NA 135  --  135 136  K 6.2*  --  4.7 4.1  CL 100*  --  97* 99*  CO2 22  --  22 25  GLUCOSE 86  --  105* 117*  BUN 37*  --  53* 33*  CREATININE 6.94*  --  8.97* 6.78*  CALCIUM 8.6*  --  8.5* 8.5*  PHOS  --  6.3* 6.7* 5.3*   Liver Function Tests: Recent Labs  Lab 09/03/17 1000 09/04/17 0714  ALBUMIN 2.8* 2.6*   No results for input(s): LIPASE, AMYLASE in the last 168 hours. CBC: Recent Labs  Lab 08/29/17 0742 09/01/17 1142 09/03/17 0615 09/03/17 1000 09/04/17 0714  WBC 3.0* 3.7* 3.7* 4.1 4.2  HGB 10.7* 10.2* 10.3* 10.5* 10.2*  HCT 33.3* 30.9* 31.8* 31.9* 31.1*  MCV 89.5 87.0 86.9 87.4 86.9  PLT 124* 150 141* 143* 127*   Blood Culture    Component Value Date/Time   SDES BLOOD RIGHT HAND 03/10/2017 0438   SPECREQUEST  03/10/2017 0438    BOTTLES DRAWN AEROBIC AND ANAEROBIC Blood Culture adequate volume   CULT NO GROWTH 5 DAYS 03/10/2017 0438   REPTSTATUS 03/15/2017 FINAL 03/10/2017 0438    Cardiac Enzymes: No results for input(s): CKTOTAL, CKMB, CKMBINDEX, TROPONINI in the last 168 hours. CBG: No results for input(s): GLUCAP in the last 168 hours. Iron Studies: No results for input(s): IRON, TIBC, TRANSFERRIN, FERRITIN in the last 72  hours. _1 @ Studies/Results: No results found. Medications: . heparin 1,700 Units/hr (09/04/17 1610)   . amitriptyline  10 mg Oral QHS  . amLODipine  10 mg Oral Daily  . atorvastatin  10 mg Oral q1800  . carvedilol  25 mg Oral BID WC  . cinacalcet  90 mg Oral Q supper  . darbepoetin (ARANESP) injection - DIALYSIS  60 mcg Intravenous Q Wed-HD  . doxazosin  4 mg Oral QHS  . doxercalciferol  8 mcg Intravenous Q M,W,F-HD  . feeding supplement (NEPRO CARB STEADY)  237 mL Oral TID WC  . feeding supplement (PRO-STAT SUGAR FREE 64)  30 mL Oral BID  . gabapentin  100 mg Oral QHS  . isosorbide mononitrate  60 mg Oral Daily  . multivitamin  1 tablet Oral QHS  . pantoprazole  40 mg Oral BID  . polyethylene glycol  17 g Oral Daily  . sevelamer carbonate  2,400 mg Oral TID WC   HD Orders: [facility] MWF  4h 180F BFR 400/800  EDW 78kg 2K/2.25Ca L AVF No heparin -Hectorol 77mg IV TIW -Venofer 578mIV q week -Mircera 15080mq 2 weeks (last 1/25)  Impression: 1 Abd pain / recur pancreatitis/Ab normal liver imaging - Loculated pseudocyst/  peripancreatic fluid collection - planning transfer to [facility] for endoscopic drainage when bed available. Due to delay in transfer, primary had reached out to other institutions to see if they will accept pt. He tentatively has been accept at [facility] next week.  2 Reduced EF 20-25% onecho. Plans for F/U with Dr. Haroldine Laws OP. Continue coreg and imdur. 2 ESRD HD MWF.HD tomorrow. K+ 4.1 No heparin.  3 Vol overload- resolved with HD.  4 Anemia Hb 10.2,Give Aranesp 60 mcg IV with HD tomorrow. Follow HGB> 5 HTN/Vol: BP well controlled. Continue current meds.HD tomorrow. Last wt 77.6 kg Nadir wt 76.4-75.1. Tentatively set EDW at 76 kg. No evidence of volume overload by exam.  6 Hx DVT on heparin gtt per pharmacy.  7. BMD: Ca 8.5 Phos 5.3.  Resumed sensipar and binders. 8. Nutrition: Albumin 2.8. Renal diet-disc fld restrictions with pt. Add  prostat/renal vits  Glena Pharris H. Reshunda Strider NP-C 09/04/2017, 11:46 AM  [employer] [phone]

## 2017-09-04 NOTE — Progress Notes (Signed)
[place] for Heparin Indication: h/o VTE  Allergies  Allergen Reactions  . Butalbital-Apap-Caffeine Shortness Of Breath, Swelling and Other (See Comments)    Swelling in throat  . Ferrlecit [Na Ferric Gluc Cplx In Sucrose] Shortness Of Breath, Swelling and Other (See Comments)    Swelling in throat, tolerates Venofor  . Minoxidil Shortness Of Breath  . Tylenol [Acetaminophen] Anaphylaxis and Swelling  . Darvocet [Propoxyphene N-Acetaminophen] Hives    Patient Measurements: Height: 6' 2" (188 cm) Weight: 171 lb 1.2 oz (77.6 kg) IBW/kg (Calculated) : 82.2 Heparin Dosing Weight: 75.1 kg  Vital Signs: Temp: 98.4 F (36.9 C) (02/14 1411) Temp Source: Oral (02/14 1411) BP: 124/78 (02/14 1411) Pulse Rate: 68 (02/14 1411)  Labs: Recent Labs    09/02/17 1030  09/03/17 0615 09/03/17 1000 09/03/17 1838 09/04/17 0714 09/04/17 1551  HGB  --    < > 10.3* 10.5*  --  10.2*  --   HCT  --   --  31.8* 31.9*  --  31.1*  --   PLT  --   --  141* 143*  --  127*  --   LABPROT  --   --  14.4  --   --  15.8*  --   INR  --   --  1.13  --   --  1.28  --   HEPARINUNFRC  --    < > <0.10*  --  <0.10* <0.10* 0.11*  CREATININE 6.94*  --   --  8.97*  --  6.78*  --    < > = values in this interval not displayed.    Estimated Creatinine Clearance: 13.8 mL/min (A) (by C-G formula based on SCr of 6.78 mg/dL (H)).   Assessment: 52 yo M with hx DVT, on IV heparin while coumadin is on hold for planned endoscopic surgery.   Heparin level remains low after rate increased.  No bleeding, no issue with lines, no interruption with infusion per RN.   Will increase and recheck level in AM. Patient awaiting transfer to [facility].   Goal of Therapy:  Heparin level 0.3-0.7 units/ml Monitor platelets by anticoagulation protocol: Yes   Plan:  Increase heparin gtt to 1950 units/hr (19.5 mL/hr).  Heparin level in 8 hours.  [employer], Pharm.D., BCPS Clinical  Pharmacist 09/04/2017 6:48 PM

## 2017-09-04 NOTE — Progress Notes (Signed)
[place] for Heparin Indication: h/o VTE  Allergies  Allergen Reactions  . Butalbital-Apap-Caffeine Shortness Of Breath, Swelling and Other (See Comments)    Swelling in throat  . Ferrlecit [Na Ferric Gluc Cplx In Sucrose] Shortness Of Breath, Swelling and Other (See Comments)    Swelling in throat, tolerates Venofor  . Minoxidil Shortness Of Breath  . Tylenol [Acetaminophen] Anaphylaxis and Swelling  . Darvocet [Propoxyphene N-Acetaminophen] Hives    Patient Measurements: Height: _0  (188 cm) Weight: 171 lb 1.2 oz (77.6 kg) IBW/kg (Calculated) : 82.2 Heparin Dosing Weight: 75.1 kg  Vital Signs: Temp: 98.5 F (36.9 C) (02/14 0523) Temp Source: Oral (02/14 0523) BP: 132/77 (02/14 0755) Pulse Rate: 65 (02/14 0755)  Labs: Recent Labs    09/01/17 1142 09/02/17 1030 09/02/17 1204 09/03/17 0615 09/03/17 1000 09/03/17 1838  HGB 10.2*  --   --  10.3* 10.5*  --   HCT 30.9*  --   --  31.8* 31.9*  --   PLT 150  --   --  141* 143*  --   LABPROT 16.6*  --   --  14.4  --   --   INR 1.35  --   --  1.13  --   --   HEPARINUNFRC 0.90*  --  <0.10* <0.10*  --  <0.10*  CREATININE 8.97* 6.94*  --   --  8.97*  --     Estimated Creatinine Clearance: 10.5 mL/min (A) (by C-G formula based on SCr of 8.97 mg/dL (H)).   Assessment: 52 yo M with DVT, on IV heparin while coumadin is on hold for planned endoscopic surgery.   Heparin level remains undetectable after rate increased to 1550 units/hr. No bleeding, no issue with lines, no interruption with infusion per RN other than small <1 min for level check above line.   Will bolus and increase and recheck level this PM.  Patient awaiting transfer to [facility].   Goal of Therapy:  Heparin level 0.3-0.7 units/ml Monitor platelets by anticoagulation protocol: Yes   Plan:  Bolus heparin 2000 units x1. Increase heparin gtt to 1700 units/hr (17 mL/hr).  Recheck level this PM.   Sloan Leiter, PharmD,  BCPS, BCCCP Clinical Pharmacist Clinical phone 09/04/2017 until 3:30PM - #[phone] After hours, please call #[phone] 09/04/2017 8:16 AM

## 2017-09-04 NOTE — Progress Notes (Signed)
Phoned [facility] in [place] gave report to [facility], nurse at 19:43 - the floor's number is [phone], her number is [phone] I advised Frank [name], that the MD has requested for copy of the Pt's Xrays to go with the Pt, we have requested for them.  We will arrange transportation with [facility] to pick up the Patient. She asked that we notify her when the patient is heading their way. Received the copy of the Xrays to send along with the Pt to [place] to to [facility] to pick up the Patient

## 2017-09-04 NOTE — Progress Notes (Signed)
Spoke with Dr. Lynita Lombard regarding transfer of Mr. Nocera to [facility] for endoscopic drainage of pancreatic pseudocyst.  He said that patient was appropriate for transfer, however, that the physician who does these procedures, Dr. Delrae Alfred, will be gone until early next week.  Recommended calling back on Sunday, 2/16, to discuss transfer patient to [facility].

## 2017-09-04 NOTE — Progress Notes (Signed)
[name]: [phone]  Patient name: Frank [name] Medical record number: [id number] Date of birth: [birth year] Age: 52 y.o. Gender: male  Primary Care Provider: No primary care provider on file. Consultants: Nephrology, GI Code Status: Full  Pt Overview and Major Events to Date:  Frank [name] is a 52 y.o. male presenting with abdominal pain thought to be secondary to acute on chronic pancreatitis. PMH is significant for PMH is significant forESRD on MWF HD, HTN, HFrEF, NICM,DVT/PE on Coumadin, anxiety and depression, chronic pain syndrome, anemia of chronic disease, GERD, chronic pancreatitis, HLD, OSA.  Assessment and Plan:  Abdominal Pain from acute on chronic pancreatitis with enlarging pseudocyst: Continued abdominal pain with stable vitals. Getting PRN Dilaudid every 3 hours. Accepting physician will be Dr. Oliver Hum of [facility] for EUS guided drainage of large peripancreatic pseudocyst which is causing compression of the stomach (no one in [place] performs this procedure). Will call other academic centers to see if bed is available elsewhere [facility]). - Gastroenterology following; appreciate recs - Zofran prn for nausea - Diet renal restrictive diet - Dilaudid 70m q3 PRN secondary to uncontrolled pain, Tylenol prn for pain control - Cont Protonix 466mBID - Cont bowel regimen of senna prn with scheduled miralax and docusate.   HFrEF / NICM: Clinically unchanged but ECHO results show acute changes. ECHO 08/27/2017 shows EF 20-25%, global hypokinesis, severe LAE, mild mitral regurg. Does not appear volume overloaded, currently receiving HD. Questionable PND vs sleep apnea at home. Does have Cardiomegaly. CT Abd did show enlarging pericardial effusion with right pleural effusion and dependent atelectasis in the right lung. -  Dr. BeHaroldine Lawso see in outpatient HF Clinic  - Cont Coreg 2528mid and Imdur 39m39mily - Fluid status  management with HD - Patient cleared for EUS procedure from Cardiac standpoint  Possible Cirrohsis: Acute. Patient does not endorse history of ETOH consumption. Hepatitis C panel negative. In light of pancreatic pseudocyst the diagnosis of cirrhosis is questionable. - GI on board; appreciate recs. Actual diagnosis of cirrhosis may have been premature. As it looks like his worsening pancreatic pseuodcyst is a much better explanation for his current symptoms. See note on abdominal pain. - Cirrhosis labs (mitochondrial antibody, alpha-1-antitrypsin, ANA, anti-smooth muscle antibody) negative  ESRD on MWF HD: [facility] Has not missed any HD sessions recently per patient. Currently receiving HD.  - Nephro managing HD; greatly appreciate their recs and management   HTN: Controlled. Patient has mostly been at goal over the past 24hrs on home regimen SBP 113-164 and DBP of 86-104.  - Cont home Norvasc 10mg48mly, Coreg 25mg 6m Doxazosin 4mg qh62m Consider restarting Hydralazine 100mg da55mif BP becomes consistently elevated  Hx DVT/PE: Has had multiple DVTs in the past. On Coumadin at home. - Coumadin stopped per GI due to pending EUS procedure.  - Heparin per pharm  Anxiety/depression: Chronic, stable. Denies any feelings of depression on admission. - Cont Amitriptyline  Chronic Pain Syndrome: Chronic. Cont home meds - cont Amitriptyline 10mg and52mapentin 100mg - cu19mtly on PRN dilaudid for abdominal pain related to pancreatic pseudocyst  Anemia of Chronic Disease: Likely related to CKD. Hgb 10.7 on admission now at 10.5 (baseline ~10) - cont to trend Hgb  HLD: Last lipid panel 08/09/17 with Chol 92, HDL 24, LDL 53, and TG 77. - Cont home Lipitor 10mg  OSA:74monic. Stable. - CPAP qhs  GERD:  Chronic. Stable. - Restart home Protonix 59m bid until able to take PO  FEN/GI: Advance diet as tolerated PPx: Coumadin for DVT/PD  Disposition:  [facility] center [facility]  Subjective:  Patient states he has increased pain this morning that has moved to his LUQ and LLQ. The pain is still controlled with his current pain regimen. He states he had two bowel movements yesterday, a large one in the afternoon, and one last night; both normal.  He has no other complaints and is awaiting transfer for EUS for his pancreatic pseudocyst.  Objective: Temp:  [97.8 F (36.6 C)-98.6 F (37 C)] 98.5 F (36.9 C) (02/14 0523) Pulse Rate:  [71-85] 72 (02/14 0523) Resp:  [10-19] 16 (02/14 0523) BP: (113-164)/(69-105) 141/97 (02/14 0523) SpO2:  [97 %-100 %] 98 % (02/14 0523) Weight:  [171 lb 1.2 oz (77.6 kg)-176 lb 9.4 oz (80.1 kg)] 171 lb 1.2 oz (77.6 kg) (02/13 1254)  Physical Exam:  Gen: Alert and Oriented x 3, NAD HEENT: Normocephalic, atraumatic, PERRLA, EOMI CV: RRR, no murmurs, normal S1, S2 split, +2 pulses dorsalis pedis bilaterally Resp: CTAB, no wheezing, rales, or rhonchi, comfortable work of breathing Abd: non-distended, non-tender, soft, +bs in all four quadrants, no hepatomegaly MSK: FROM in all four extremities Ext: no clubbing, cyanosis, or edema Skin: warm, dry, intact, no rashes  Laboratory: Recent Labs  Lab 09/01/17 1142 09/03/17 0615 09/03/17 1000  WBC 3.7* 3.7* 4.1  HGB 10.2* 10.3* 10.5*  HCT 30.9* 31.8* 31.9*  PLT 150 141* 143*   Recent Labs  Lab 09/01/17 1142 09/02/17 1030 09/03/17 1000  NA 137 135 135  K 4.4 6.2* 4.7  CL 98* 100* 97*  CO2 _0 BUN 58* 37* 53*  CREATININE 8.97* 6.94* 8.97*  CALCIUM 8.9 8.6* 8.5*  GLUCOSE 83 86 105*   2/2 - U/A: Pending 2/3 - Lipase: 88 2/3 - Protime/INR: 15.9/16.8 2/5 - Hep C Panel: HCV Ab <0.1 2/6 - Albumin: 2.7 2/6 - Anti-smooth muscle: 17 (negative) 2/6 - ANA w/reflex: Negative 2/6 - Alpha-1-antitrypsin: 144 (normal) 2/6 - Mitochondrial antibody: <20.0  (negative)  Imaging/Diagnostic Tests:  2/3 - CXR:  FINDINGS: Cardiomegaly with pulmonary vascular congestion. Very mild interstitial edema is possible, although this is improved from the prior. Suspected trace bilateral pleural effusions. No pneumothorax.  IMPRESSION: Cardiomegaly with possible mild interstitial edema, although improved from the prior.  Trace bilateral pleural effusions.  2/3 - Abd X-ray: FINDINGS: Nonobstructive bowel gas pattern.  Multiple air-fluid levels on this single upright abdominal radiograph, raising the possibility of small bowel enteritis.  No evidence of free air under the diaphragm on the upright view.  IMPRESSION: No evidence of small bowel obstruction or free air.  Multiple air-fluid levels, raising the possibility of small bowel Enteritis.  2/4 - KUB: IMPRESSION: Oral contrast present for CT. No suspicion of ileus, obstruction or free air.  2/6 - Abd U/S: IMPRESSION: 1. Peripancreatic/left upper quadrant fluid collection as seen on CT, possibly a pseudocyst. 2. Gallbladder wall thickening, pericholecystic fluid, and positive sonographic Murphy sign. No gallstones. Acalculus cholecystitis is possible in the appropriate clinical setting. 3. Severely atrophic native kidneys.  Failed renal transplant.  2/6 - ECHO: Impressions: - The LV function is markedly reduced. Grade 3 diastolic dysfunction there is a small - moderate sized pericardial effusion but no evidence of RA or RV collapse.   LNuala Alpha DO 09/04/2017, 9:16 AM PGY-1, [place]  [place] Intern pager: [phone], text pages welcome

## 2017-09-06 DIAGNOSIS — K7460 Unspecified cirrhosis of liver: Secondary | ICD-10-CM

## 2017-09-08 MED ORDER — SEVELAMER CARBONATE 800 MG PO TABS
2400.00 | ORAL_TABLET | ORAL | Status: DC
Start: 2017-09-08 — End: 2017-09-08

## 2017-09-08 MED ORDER — GLUCOSE 40 % PO GEL
15.00 | ORAL | Status: DC
Start: ? — End: 2017-09-08

## 2017-09-08 MED ORDER — HEPARIN SODIUM (PORCINE) 1000 UNIT/ML IJ SOLN
80.00 | INTRAMUSCULAR | Status: DC
Start: ? — End: 2017-09-08

## 2017-09-08 MED ORDER — ONDANSETRON HCL 4 MG/2ML IJ SOLN
4.00 | INTRAMUSCULAR | Status: DC
Start: 2017-09-08 — End: 2017-09-08

## 2017-09-08 MED ORDER — PANTOPRAZOLE SODIUM 40 MG PO TBEC
40.00 | DELAYED_RELEASE_TABLET | ORAL | Status: DC
Start: 2017-09-08 — End: 2017-09-08

## 2017-09-08 MED ORDER — DICYCLOMINE HCL 10 MG PO CAPS
20.00 | ORAL_CAPSULE | ORAL | Status: DC
Start: 2017-09-08 — End: 2017-09-08

## 2017-09-08 MED ORDER — CINACALCET HCL 30 MG PO TABS
90.00 | ORAL_TABLET | ORAL | Status: DC
Start: 2017-09-08 — End: 2017-09-08

## 2017-09-08 MED ORDER — DOXAZOSIN MESYLATE 4 MG PO TABS
4.00 | ORAL_TABLET | ORAL | Status: DC
Start: 2017-09-08 — End: 2017-09-08

## 2017-09-08 MED ORDER — WARFARIN SODIUM 7.5 MG PO TABS
7.50 | ORAL_TABLET | ORAL | Status: DC
Start: 2017-09-08 — End: 2017-09-08

## 2017-09-08 MED ORDER — ATORVASTATIN CALCIUM 10 MG PO TABS
10.00 | ORAL_TABLET | ORAL | Status: DC
Start: 2017-09-08 — End: 2017-09-08

## 2017-09-08 MED ORDER — MULTI-VITAMINS PO TABS
1.00 | ORAL_TABLET | ORAL | Status: DC
Start: 2017-09-08 — End: 2017-09-08

## 2017-09-08 MED ORDER — AMLODIPINE BESYLATE 5 MG PO TABS
10.00 | ORAL_TABLET | ORAL | Status: DC
Start: 2017-09-08 — End: 2017-09-08

## 2017-09-08 MED ORDER — HEPARIN SOD (PORCINE) IN D5W 100 UNIT/ML IV SOLN
26.00 | INTRAVENOUS | Status: DC
Start: ? — End: 2017-09-08

## 2017-09-08 MED ORDER — CARVEDILOL 12.5 MG PO TABS
25.00 | ORAL_TABLET | ORAL | Status: DC
Start: 2017-09-08 — End: 2017-09-08

## 2017-09-08 MED ORDER — POLYETHYLENE GLYCOL 3350 17 G PO PACK
17.00 | PACK | ORAL | Status: DC
Start: ? — End: 2017-09-08

## 2017-09-08 MED ORDER — DOCUSATE SODIUM 100 MG PO CAPS
100.00 | ORAL_CAPSULE | ORAL | Status: DC
Start: 2017-09-08 — End: 2017-09-08

## 2017-09-08 MED ORDER — ISOSORBIDE MONONITRATE ER 30 MG PO TB24
60.00 | ORAL_TABLET | ORAL | Status: DC
Start: 2017-09-09 — End: 2017-09-08

## 2017-09-08 MED ORDER — ONDANSETRON 4 MG PO TBDP
4.00 | ORAL_TABLET | ORAL | Status: DC
Start: ? — End: 2017-09-08

## 2017-09-08 MED ORDER — GABAPENTIN 100 MG PO CAPS
100.00 | ORAL_CAPSULE | ORAL | Status: DC
Start: 2017-09-09 — End: 2017-09-08

## 2017-09-08 MED ORDER — AMMONIUM LACTATE 5 % EX LOTN
TOPICAL_LOTION | CUTANEOUS | Status: DC
Start: ? — End: 2017-09-08

## 2017-09-08 MED ORDER — METOCLOPRAMIDE HCL 5 MG PO TABS
5.00 | ORAL_TABLET | ORAL | Status: DC
Start: ? — End: 2017-09-08

## 2017-09-08 MED ORDER — OXYCODONE HCL 5 MG PO TABS
5.00 | ORAL_TABLET | ORAL | Status: DC
Start: ? — End: 2017-09-08

## 2017-09-08 MED ORDER — GENERIC EXTERNAL MEDICATION
Status: DC
Start: ? — End: 2017-09-08

## 2017-09-08 MED ORDER — HYDROMORPHONE HCL 1 MG/ML IJ SOLN
1.00 | INTRAMUSCULAR | Status: DC
Start: ? — End: 2017-09-08

## 2017-09-08 MED ORDER — AMITRIPTYLINE HCL 10 MG PO TABS
10.00 | ORAL_TABLET | ORAL | Status: DC
Start: 2017-09-08 — End: 2017-09-08

## 2017-09-08 MED ORDER — HYDRALAZINE HCL 25 MG PO TABS
100.00 | ORAL_TABLET | ORAL | Status: DC
Start: 2017-09-09 — End: 2017-09-08

## 2017-09-08 MED ORDER — DEXTROSE 50 % IV SOLN
12.50 | INTRAVENOUS | Status: DC
Start: ? — End: 2017-09-08

## 2017-09-08 MED ORDER — HEPARIN SODIUM (PORCINE) 1000 UNIT/ML IJ SOLN
40.00 | INTRAMUSCULAR | Status: DC
Start: ? — End: 2017-09-08

## 2017-09-08 MED ORDER — LIDOCAINE HCL (PF) 1 % IJ SOLN
0.50 | INTRAMUSCULAR | Status: DC
Start: ? — End: 2017-09-08

## 2017-09-15 DIAGNOSIS — D509 Iron deficiency anemia, unspecified: Secondary | ICD-10-CM | POA: Diagnosis not present

## 2017-09-15 DIAGNOSIS — N186 End stage renal disease: Secondary | ICD-10-CM | POA: Diagnosis not present

## 2017-09-15 DIAGNOSIS — D631 Anemia in chronic kidney disease: Secondary | ICD-10-CM | POA: Diagnosis not present

## 2017-09-15 DIAGNOSIS — N2581 Secondary hyperparathyroidism of renal origin: Secondary | ICD-10-CM | POA: Diagnosis not present

## 2017-09-15 DIAGNOSIS — Z992 Dependence on renal dialysis: Secondary | ICD-10-CM | POA: Diagnosis not present

## 2017-09-15 DIAGNOSIS — Z86718 Personal history of other venous thrombosis and embolism: Secondary | ICD-10-CM | POA: Diagnosis not present

## 2017-09-17 DIAGNOSIS — D631 Anemia in chronic kidney disease: Secondary | ICD-10-CM | POA: Diagnosis not present

## 2017-09-17 DIAGNOSIS — N186 End stage renal disease: Secondary | ICD-10-CM | POA: Diagnosis not present

## 2017-09-17 DIAGNOSIS — Z992 Dependence on renal dialysis: Secondary | ICD-10-CM | POA: Diagnosis not present

## 2017-09-17 DIAGNOSIS — D509 Iron deficiency anemia, unspecified: Secondary | ICD-10-CM | POA: Diagnosis not present

## 2017-09-17 DIAGNOSIS — N2581 Secondary hyperparathyroidism of renal origin: Secondary | ICD-10-CM | POA: Diagnosis not present

## 2017-09-19 ENCOUNTER — Encounter: Payer: Self-pay | Admitting: Pharmacist

## 2017-09-19 DIAGNOSIS — N2581 Secondary hyperparathyroidism of renal origin: Secondary | ICD-10-CM | POA: Diagnosis not present

## 2017-09-19 DIAGNOSIS — N186 End stage renal disease: Secondary | ICD-10-CM | POA: Diagnosis not present

## 2017-09-19 NOTE — Progress Notes (Signed)
Received a fax from [facility] that patient had an INR yesterday and it was 1.31. Patient no longer seen here. Will fax back lab that we do not see him anymore and do not manage his warfarin.

## 2017-09-21 ENCOUNTER — Encounter (HOSPITAL_COMMUNITY): Payer: Self-pay

## 2017-09-21 ENCOUNTER — Emergency Department (HOSPITAL_COMMUNITY): Payer: Medicare Other

## 2017-09-21 ENCOUNTER — Inpatient Hospital Stay (HOSPITAL_COMMUNITY)
Admission: EM | Admit: 2017-09-21 | Discharge: 2017-09-24 | DRG: 193 | Disposition: A | Discharge status: Other Acute Inpatient Hospital | Payer: Medicare Other | Attending: Family Medicine | Admitting: Family Medicine

## 2017-09-21 ENCOUNTER — Other Ambulatory Visit: Payer: Self-pay

## 2017-09-21 DIAGNOSIS — E1122 Type 2 diabetes mellitus with diabetic chronic kidney disease: Secondary | ICD-10-CM | POA: Diagnosis present

## 2017-09-21 DIAGNOSIS — Z9119 Patient's noncompliance with other medical treatment and regimen: Secondary | ICD-10-CM

## 2017-09-21 DIAGNOSIS — M25512 Pain in left shoulder: Secondary | ICD-10-CM

## 2017-09-21 DIAGNOSIS — R0600 Dyspnea, unspecified: Secondary | ICD-10-CM | POA: Diagnosis not present

## 2017-09-21 DIAGNOSIS — K863 Pseudocyst of pancreas: Secondary | ICD-10-CM | POA: Diagnosis not present

## 2017-09-21 DIAGNOSIS — J9601 Acute respiratory failure with hypoxia: Secondary | ICD-10-CM | POA: Diagnosis present

## 2017-09-21 DIAGNOSIS — I313 Pericardial effusion (noninflammatory): Secondary | ICD-10-CM | POA: Diagnosis present

## 2017-09-21 DIAGNOSIS — N189 Chronic kidney disease, unspecified: Secondary | ICD-10-CM

## 2017-09-21 DIAGNOSIS — N186 End stage renal disease: Secondary | ICD-10-CM | POA: Diagnosis not present

## 2017-09-21 DIAGNOSIS — E1129 Type 2 diabetes mellitus with other diabetic kidney complication: Secondary | ICD-10-CM | POA: Diagnosis not present

## 2017-09-21 DIAGNOSIS — Z86718 Personal history of other venous thrombosis and embolism: Secondary | ICD-10-CM

## 2017-09-21 DIAGNOSIS — K859 Acute pancreatitis without necrosis or infection, unspecified: Secondary | ICD-10-CM

## 2017-09-21 DIAGNOSIS — I10 Essential (primary) hypertension: Secondary | ICD-10-CM | POA: Diagnosis not present

## 2017-09-21 DIAGNOSIS — J101 Influenza due to other identified influenza virus with other respiratory manifestations: Secondary | ICD-10-CM | POA: Diagnosis not present

## 2017-09-21 DIAGNOSIS — G4733 Obstructive sleep apnea (adult) (pediatric): Secondary | ICD-10-CM | POA: Diagnosis present

## 2017-09-21 DIAGNOSIS — Z7901 Long term (current) use of anticoagulants: Secondary | ICD-10-CM

## 2017-09-21 DIAGNOSIS — I132 Hypertensive heart and chronic kidney disease with heart failure and with stage 5 chronic kidney disease, or end stage renal disease: Secondary | ICD-10-CM | POA: Diagnosis present

## 2017-09-21 DIAGNOSIS — J111 Influenza due to unidentified influenza virus with other respiratory manifestations: Secondary | ICD-10-CM | POA: Diagnosis present

## 2017-09-21 DIAGNOSIS — R0989 Other specified symptoms and signs involving the circulatory and respiratory systems: Secondary | ICD-10-CM | POA: Diagnosis not present

## 2017-09-21 DIAGNOSIS — I120 Hypertensive chronic kidney disease with stage 5 chronic kidney disease or end stage renal disease: Secondary | ICD-10-CM | POA: Diagnosis not present

## 2017-09-21 DIAGNOSIS — F329 Major depressive disorder, single episode, unspecified: Secondary | ICD-10-CM | POA: Diagnosis present

## 2017-09-21 DIAGNOSIS — I82409 Acute embolism and thrombosis of unspecified deep veins of unspecified lower extremity: Secondary | ICD-10-CM | POA: Diagnosis not present

## 2017-09-21 DIAGNOSIS — E1159 Type 2 diabetes mellitus with other circulatory complications: Secondary | ICD-10-CM | POA: Diagnosis present

## 2017-09-21 DIAGNOSIS — R531 Weakness: Secondary | ICD-10-CM | POA: Diagnosis not present

## 2017-09-21 DIAGNOSIS — R0602 Shortness of breath: Secondary | ICD-10-CM | POA: Diagnosis not present

## 2017-09-21 DIAGNOSIS — R079 Chest pain, unspecified: Secondary | ICD-10-CM | POA: Diagnosis not present

## 2017-09-21 DIAGNOSIS — J029 Acute pharyngitis, unspecified: Secondary | ICD-10-CM | POA: Diagnosis not present

## 2017-09-21 DIAGNOSIS — I429 Cardiomyopathy, unspecified: Secondary | ICD-10-CM | POA: Diagnosis present

## 2017-09-21 DIAGNOSIS — Z992 Dependence on renal dialysis: Secondary | ICD-10-CM | POA: Diagnosis not present

## 2017-09-21 DIAGNOSIS — G894 Chronic pain syndrome: Secondary | ICD-10-CM | POA: Diagnosis present

## 2017-09-21 DIAGNOSIS — IMO0002 Reserved for concepts with insufficient information to code with codable children: Secondary | ICD-10-CM | POA: Diagnosis present

## 2017-09-21 DIAGNOSIS — J9621 Acute and chronic respiratory failure with hypoxia: Secondary | ICD-10-CM | POA: Diagnosis not present

## 2017-09-21 DIAGNOSIS — F419 Anxiety disorder, unspecified: Secondary | ICD-10-CM | POA: Diagnosis present

## 2017-09-21 DIAGNOSIS — D631 Anemia in chronic kidney disease: Secondary | ICD-10-CM | POA: Diagnosis present

## 2017-09-21 DIAGNOSIS — K7460 Unspecified cirrhosis of liver: Secondary | ICD-10-CM | POA: Diagnosis present

## 2017-09-21 DIAGNOSIS — K219 Gastro-esophageal reflux disease without esophagitis: Secondary | ICD-10-CM | POA: Diagnosis not present

## 2017-09-21 DIAGNOSIS — E1165 Type 2 diabetes mellitus with hyperglycemia: Secondary | ICD-10-CM

## 2017-09-21 DIAGNOSIS — N2581 Secondary hyperparathyroidism of renal origin: Secondary | ICD-10-CM | POA: Diagnosis not present

## 2017-09-21 DIAGNOSIS — N185 Chronic kidney disease, stage 5: Secondary | ICD-10-CM

## 2017-09-21 DIAGNOSIS — K861 Other chronic pancreatitis: Secondary | ICD-10-CM | POA: Diagnosis present

## 2017-09-21 DIAGNOSIS — G8929 Other chronic pain: Secondary | ICD-10-CM | POA: Diagnosis present

## 2017-09-21 DIAGNOSIS — R404 Transient alteration of awareness: Secondary | ICD-10-CM | POA: Diagnosis not present

## 2017-09-21 DIAGNOSIS — Z79899 Other long term (current) drug therapy: Secondary | ICD-10-CM

## 2017-09-21 DIAGNOSIS — Z87891 Personal history of nicotine dependence: Secondary | ICD-10-CM

## 2017-09-21 DIAGNOSIS — E785 Hyperlipidemia, unspecified: Secondary | ICD-10-CM | POA: Diagnosis present

## 2017-09-21 DIAGNOSIS — I5042 Chronic combined systolic (congestive) and diastolic (congestive) heart failure: Secondary | ICD-10-CM | POA: Diagnosis not present

## 2017-09-21 DIAGNOSIS — T8612 Kidney transplant failure: Secondary | ICD-10-CM | POA: Diagnosis present

## 2017-09-21 DIAGNOSIS — E875 Hyperkalemia: Secondary | ICD-10-CM | POA: Diagnosis present

## 2017-09-21 DIAGNOSIS — R509 Fever, unspecified: Secondary | ICD-10-CM | POA: Diagnosis not present

## 2017-09-21 DIAGNOSIS — J9690 Respiratory failure, unspecified, unspecified whether with hypoxia or hypercapnia: Secondary | ICD-10-CM | POA: Diagnosis present

## 2017-09-21 DIAGNOSIS — Z86711 Personal history of pulmonary embolism: Secondary | ICD-10-CM

## 2017-09-21 LAB — TROPONIN I: Troponin I: 0.08 ng/mL (ref <0.03)

## 2017-09-21 LAB — COMPREHENSIVE METABOLIC PANEL
ALT: 14 U/L — ABNORMAL LOW (ref 17–63)
AST: 27 U/L (ref 15–41)
Albumin: 3.3 g/dL — ABNORMAL LOW (ref 3.5–5.0)
Alkaline Phosphatase: 91 U/L (ref 38–126)
Anion gap: 21 — ABNORMAL HIGH (ref 5–15)
BILIRUBIN TOTAL: 1.2 mg/dL (ref 0.3–1.2)
BUN: 68 mg/dL — AB (ref 6–20)
CO2: 21 mmol/L — ABNORMAL LOW (ref 22–32)
CREATININE: 12.65 mg/dL — AB (ref 0.61–1.24)
Calcium: 9.3 mg/dL (ref 8.9–10.3)
Chloride: 96 mmol/L — ABNORMAL LOW (ref 101–111)
GFR, EST AFRICAN AMERICAN: 5 mL/min — AB (ref >60)
GFR, EST NON AFRICAN AMERICAN: 4 mL/min — AB (ref >60)
Glucose, Bld: 110 mg/dL — ABNORMAL HIGH (ref 65–99)
POTASSIUM: 5.2 mmol/L — AB (ref 3.5–5.1)
Sodium: 138 mmol/L (ref 135–145)
TOTAL PROTEIN: 8 g/dL (ref 6.5–8.1)

## 2017-09-21 LAB — CBC WITH DIFFERENTIAL/PLATELET
BASOS ABS: 0 10*3/uL (ref 0.0–0.1)
Basophils Relative: 0 %
EOS ABS: 0 10*3/uL (ref 0.0–0.7)
Eosinophils Relative: 1 %
HCT: 29.1 % — ABNORMAL LOW (ref 39.0–52.0)
Hemoglobin: 9.7 g/dL — ABNORMAL LOW (ref 13.0–17.0)
LYMPHS PCT: 12 %
Lymphs Abs: 0.7 10*3/uL (ref 0.7–4.0)
MCH: 28.4 pg (ref 26.0–34.0)
MCHC: 33.3 g/dL (ref 30.0–36.0)
MCV: 85.3 fL (ref 78.0–100.0)
Monocytes Absolute: 0.4 10*3/uL (ref 0.1–1.0)
Monocytes Relative: 7 %
Neutro Abs: 4.8 10*3/uL (ref 1.7–7.7)
Neutrophils Relative %: 80 %
Platelets: 146 10*3/uL — ABNORMAL LOW (ref 150–400)
RBC: 3.41 MIL/uL — AB (ref 4.22–5.81)
RDW: 18.1 % — ABNORMAL HIGH (ref 11.5–15.5)
WBC: 6 10*3/uL (ref 4.0–10.5)

## 2017-09-21 LAB — I-STAT CG4 LACTIC ACID, ED
LACTIC ACID, VENOUS: 2.12 mmol/L — AB (ref 0.5–1.9)
Lactic Acid, Venous: 1.33 mmol/L (ref 0.5–1.9)

## 2017-09-21 LAB — LIPASE, BLOOD: LIPASE: 21 U/L (ref 11–51)

## 2017-09-21 LAB — INFLUENZA PANEL BY PCR (TYPE A & B)
INFLAPCR: POSITIVE — AB
INFLBPCR: NEGATIVE

## 2017-09-21 LAB — PROTIME-INR
INR: 1.56
Prothrombin Time: 18.5 seconds — ABNORMAL HIGH (ref 11.4–15.2)

## 2017-09-21 MED ORDER — SODIUM CHLORIDE 0.9 % IV SOLN: 2.0000 g | INTRAVENOUS | Status: DC

## 2017-09-21 MED ORDER — OSELTAMIVIR PHOSPHATE 30 MG PO CAPS
30.0000 mg | ORAL_CAPSULE | Freq: Once | ORAL | Status: AC
  Administered 2017-09-21: 30 mg via ORAL
  Filled 2017-09-21: qty 1

## 2017-09-21 MED ORDER — HYDROMORPHONE HCL 1 MG/ML IJ SOLN
0.5000 mg | INTRAMUSCULAR | Status: DC | PRN
  Administered 2017-09-21 – 2017-09-23 (×13): 0.5 mg via INTRAVENOUS
  Filled 2017-09-21 (×12): qty 1

## 2017-09-21 MED ORDER — HEPARIN BOLUS VIA INFUSION
4000.0000 [IU] | Freq: Once | INTRAVENOUS | Status: AC
Start: 2017-09-21 — End: 2017-09-21
  Administered 2017-09-21: 4000 [IU] via INTRAVENOUS
  Filled 2017-09-21: qty 4000

## 2017-09-21 MED ORDER — SODIUM CHLORIDE 0.9 % IV SOLN: INTRAVENOUS | Status: DC

## 2017-09-21 MED ORDER — HYDRALAZINE HCL 20 MG/ML IJ SOLN
5.0000 mg | Freq: Once | INTRAMUSCULAR | Status: AC
  Administered 2017-09-21: 5 mg via INTRAVENOUS
  Filled 2017-09-21: qty 1

## 2017-09-21 MED ORDER — HYDROMORPHONE HCL 1 MG/ML IJ SOLN
1.0000 mg | Freq: Once | INTRAMUSCULAR | Status: AC
  Administered 2017-09-21: 1 mg via INTRAVENOUS
  Filled 2017-09-21: qty 1

## 2017-09-21 MED ORDER — LABETALOL HCL 5 MG/ML IV SOLN
10.0000 mg | INTRAVENOUS | Status: DC | PRN
  Filled 2017-09-21: qty 4

## 2017-09-21 MED ORDER — MORPHINE SULFATE (PF) 4 MG/ML IV SOLN: 2.0000 mg | INTRAVENOUS | Status: DC | PRN

## 2017-09-21 MED ORDER — VANCOMYCIN HCL IN DEXTROSE 750-5 MG/150ML-% IV SOLN: 750.0000 mg | INTRAVENOUS | Status: DC

## 2017-09-21 MED ORDER — ONDANSETRON HCL 4 MG/2ML IJ SOLN: 4.0000 mg | Freq: Four times a day (QID) | INTRAMUSCULAR | Status: DC | PRN

## 2017-09-21 MED ORDER — ONDANSETRON HCL 4 MG PO TABS: 4.0000 mg | ORAL_TABLET | Freq: Four times a day (QID) | ORAL | Status: DC | PRN

## 2017-09-21 MED ORDER — HEPARIN (PORCINE) IN NACL 100-0.45 UNIT/ML-% IJ SOLN
1500.0000 [IU]/h | INTRAMUSCULAR | Status: DC
  Administered 2017-09-21 – 2017-09-22 (×2): 1350 [IU]/h via INTRAVENOUS
  Filled 2017-09-21 (×3): qty 250

## 2017-09-21 MED ORDER — MORPHINE SULFATE (PF) 4 MG/ML IV SOLN
2.0000 mg | Freq: Once | INTRAVENOUS | Status: AC
  Administered 2017-09-21: 2 mg via INTRAVENOUS
  Filled 2017-09-21: qty 1

## 2017-09-21 MED ORDER — ALBUTEROL SULFATE (2.5 MG/3ML) 0.083% IN NEBU: 2.5000 mg | INHALATION_SOLUTION | Freq: Four times a day (QID) | RESPIRATORY_TRACT | Status: DC | PRN

## 2017-09-21 MED ORDER — SODIUM CHLORIDE 0.9 % IV SOLN: INTRAVENOUS | Status: AC

## 2017-09-21 MED ORDER — SODIUM CHLORIDE 0.9 % IV SOLN
2.0000 g | Freq: Once | INTRAVENOUS | Status: AC
  Administered 2017-09-21: 2 g via INTRAVENOUS
  Filled 2017-09-21: qty 2

## 2017-09-21 MED ORDER — VANCOMYCIN HCL 10 G IV SOLR
1500.0000 mg | Freq: Once | INTRAVENOUS | Status: AC
  Administered 2017-09-21: 1500 mg via INTRAVENOUS
  Filled 2017-09-21: qty 1500

## 2017-09-21 MED ORDER — ALBUTEROL SULFATE (2.5 MG/3ML) 0.083% IN NEBU
2.5000 mg | INHALATION_SOLUTION | Freq: Once | RESPIRATORY_TRACT | Status: AC
  Administered 2017-09-21: 2.5 mg via RESPIRATORY_TRACT
  Filled 2017-09-21: qty 3

## 2017-09-21 MED ORDER — ONDANSETRON HCL 4 MG/2ML IJ SOLN
4.0000 mg | Freq: Once | INTRAMUSCULAR | Status: AC
  Administered 2017-09-21: 4 mg via INTRAVENOUS
  Filled 2017-09-21: qty 2

## 2017-09-21 MED ORDER — VANCOMYCIN HCL IN DEXTROSE 1-5 GM/200ML-% IV SOLN: 1000.0000 mg | Freq: Once | INTRAVENOUS | Status: DC

## 2017-09-21 MED ORDER — PANTOPRAZOLE SODIUM 40 MG IV SOLR
40.0000 mg | Freq: Every day | INTRAVENOUS | Status: DC
  Administered 2017-09-21 – 2017-09-24 (×4): 40 mg via INTRAVENOUS
  Filled 2017-09-21 (×4): qty 40

## 2017-09-21 NOTE — ED Triage Notes (Signed)
Pt from home with weakness since Friday pt only had 2hrs of his dialysis treatment. EMS gave pt 1000 of Tylenol pt oral temp now 97.8 pt room air sat is 87% and 93% on 4L pt has been coughing up green sputum
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
## 2017-09-21 NOTE — Progress Notes (Signed)
Placed pt on cpap with Large Full faced mask a pressure of 10 and 3l bled in. Pt tolerating well. Rt to cont to monitor.

## 2017-09-21 NOTE — Progress Notes (Signed)
ANTICOAGULATION CONSULT NOTE - Initial Consult  Pharmacy Consult for heparin Indication:history of DVT  Allergies  Allergen Reactions  . Butalbital-Apap-Caffeine Shortness Of Breath, Swelling and Other (See Comments)    Swelling in throat  . Ferrlecit [Na Ferric Gluc Cplx In Sucrose] Shortness Of Breath, Swelling and Other (See Comments)    Swelling in throat, tolerates Venofor  . Minoxidil Shortness Of Breath  . Tylenol [Acetaminophen] Anaphylaxis and Swelling  . Darvocet [Propoxyphene N-Acetaminophen] Hives    Patient Measurements: Height: _0  (188 cm) Weight: 173 lb 4.5 oz (78.6 kg) IBW/kg (Calculated) : 82.2 Heparin Dosing Weight: 78.6 kg   Vital Signs: Temp: 98.1 F (36.7 C) (03/03 1252) Temp Source: Oral (03/03 1252) BP: 172/116 (03/03 1252) Pulse Rate: 95 (03/03 1252)  Labs: Recent Labs    09/21/17 0919  HGB 9.7*  HCT 29.1*  PLT 146*  LABPROT 18.5*  INR 1.56  CREATININE 12.65*  TROPONINI 0.08*    Estimated Creatinine Clearance: 7.5 mL/min (A) (by C-G formula based on SCr of 12.65 mg/dL (H)).   Assessment: 52 y.o. male presenting with abdominal and chest pain. On warfarin PTA for history of recurrent DVT. INR today is 1.56. Hemoglobin and platelets are low, stable. Pharmacy consulted to start heparin as patient to have pancreatic lesion drained.    Goal of Therapy:  Heparin level 0.3-0.7 units/ml Monitor platelets by anticoagulation protocol: Yes   Plan:  Heparin bolus 4000 units x 1  Heparin infusion 1350 units/hr Continue to monitor H&H and platelets  Check heparin level in 6 hours   Jalene Mullet, Pharm.D. PGY1 Pharmacy Resident 09/21/2017 3:23 PM Main Pharmacy: [phone]

## 2017-09-21 NOTE — Assessment & Plan Note (Signed)
CHF (EF 20-25% and grade 3 diastolic dysfunction by echo 2/19),

## 2017-09-21 NOTE — Progress Notes (Signed)
Pharmacy Antibiotic Note  Frank [name] is a 52 y.o. male admitted on 09/21/2017 with pneumonia.  Pharmacy has been consulted for vancomycin and cefepime dosing.  One time doses ordered in the ED. ESRD on HD-MWF, last HD session on 3/1 but cut short.  Plan: Continue cefepime 2g IV QHD-MWF Continue vancomycin 762m IV QHD-MWF  Monitor clinical picture, renal function, preHD VR prn F/U HD plans, C&S, abx deescalation / LOT   Height: 6' 2" (188 cm) Weight: 171 lb (77.6 kg) IBW/kg (Calculated) : 82.2  Temp (24hrs), Avg:98.7 F (37.1 C), Min:98.7 F (37.1 C), Max:98.7 F (37.1 C)  Recent Labs  Lab 09/21/17 0919 09/21/17 0943  WBC 6.0  --   LATICACIDVEN  --  2.12*    Estimated Creatinine Clearance: 13.8 mL/min (A) (by C-G formula based on SCr of 6.78 mg/dL (H)).    Allergies  Allergen Reactions  . Butalbital-Apap-Caffeine Shortness Of Breath, Swelling and Other (See Comments)    Swelling in throat  . Ferrlecit [Na Ferric Gluc Cplx In Sucrose] Shortness Of Breath, Swelling and Other (See Comments)    Swelling in throat, tolerates Venofor  . Minoxidil Shortness Of Breath  . Tylenol [Acetaminophen] Anaphylaxis and Swelling  . Darvocet [Propoxyphene N-Acetaminophen] Hives    Thank you for allowing pharmacy to be a part of this patient's care.  BReginia Naas3/09/2017 10:16 AM
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
## 2017-09-21 NOTE — ED Notes (Signed)
LACTIC ACID : 2.12  Reported to Dr. Regenia Skeeter at [phone] via in person

## 2017-09-21 NOTE — ED Notes (Signed)
Pt O2 turned off to see what room air sat was
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
## 2017-09-22 ENCOUNTER — Inpatient Hospital Stay (HOSPITAL_COMMUNITY): Payer: Medicare Other

## 2017-09-22 ENCOUNTER — Encounter (HOSPITAL_COMMUNITY): Payer: Self-pay | Admitting: Family Medicine

## 2017-09-22 DIAGNOSIS — J111 Influenza due to unidentified influenza virus with other respiratory manifestations: Secondary | ICD-10-CM | POA: Diagnosis not present

## 2017-09-22 DIAGNOSIS — G894 Chronic pain syndrome: Secondary | ICD-10-CM | POA: Diagnosis present

## 2017-09-22 DIAGNOSIS — N186 End stage renal disease: Secondary | ICD-10-CM | POA: Diagnosis not present

## 2017-09-22 DIAGNOSIS — R0602 Shortness of breath: Secondary | ICD-10-CM | POA: Diagnosis not present

## 2017-09-22 DIAGNOSIS — J101 Influenza due to other identified influenza virus with other respiratory manifestations: Secondary | ICD-10-CM | POA: Diagnosis present

## 2017-09-22 DIAGNOSIS — I5042 Chronic combined systolic (congestive) and diastolic (congestive) heart failure: Secondary | ICD-10-CM | POA: Diagnosis present

## 2017-09-22 DIAGNOSIS — I132 Hypertensive heart and chronic kidney disease with heart failure and with stage 5 chronic kidney disease, or end stage renal disease: Secondary | ICD-10-CM | POA: Diagnosis present

## 2017-09-22 DIAGNOSIS — N2581 Secondary hyperparathyroidism of renal origin: Secondary | ICD-10-CM | POA: Diagnosis not present

## 2017-09-22 DIAGNOSIS — R0902 Hypoxemia: Secondary | ICD-10-CM | POA: Diagnosis not present

## 2017-09-22 DIAGNOSIS — T8612 Kidney transplant failure: Secondary | ICD-10-CM | POA: Diagnosis present

## 2017-09-22 DIAGNOSIS — I313 Pericardial effusion (noninflammatory): Secondary | ICD-10-CM | POA: Diagnosis present

## 2017-09-22 DIAGNOSIS — I429 Cardiomyopathy, unspecified: Secondary | ICD-10-CM | POA: Diagnosis present

## 2017-09-22 DIAGNOSIS — Z992 Dependence on renal dialysis: Secondary | ICD-10-CM | POA: Diagnosis not present

## 2017-09-22 DIAGNOSIS — Z7901 Long term (current) use of anticoagulants: Secondary | ICD-10-CM | POA: Diagnosis not present

## 2017-09-22 DIAGNOSIS — I120 Hypertensive chronic kidney disease with stage 5 chronic kidney disease or end stage renal disease: Secondary | ICD-10-CM | POA: Diagnosis not present

## 2017-09-22 DIAGNOSIS — D631 Anemia in chronic kidney disease: Secondary | ICD-10-CM | POA: Diagnosis not present

## 2017-09-22 DIAGNOSIS — K861 Other chronic pancreatitis: Secondary | ICD-10-CM | POA: Diagnosis not present

## 2017-09-22 DIAGNOSIS — J9601 Acute respiratory failure with hypoxia: Secondary | ICD-10-CM | POA: Diagnosis present

## 2017-09-22 DIAGNOSIS — K7460 Unspecified cirrhosis of liver: Secondary | ICD-10-CM | POA: Diagnosis present

## 2017-09-22 DIAGNOSIS — F329 Major depressive disorder, single episode, unspecified: Secondary | ICD-10-CM | POA: Diagnosis present

## 2017-09-22 DIAGNOSIS — F419 Anxiety disorder, unspecified: Secondary | ICD-10-CM | POA: Diagnosis present

## 2017-09-22 DIAGNOSIS — K863 Pseudocyst of pancreas: Secondary | ICD-10-CM | POA: Diagnosis present

## 2017-09-22 DIAGNOSIS — Z86718 Personal history of other venous thrombosis and embolism: Secondary | ICD-10-CM | POA: Diagnosis not present

## 2017-09-22 DIAGNOSIS — K219 Gastro-esophageal reflux disease without esophagitis: Secondary | ICD-10-CM | POA: Diagnosis present

## 2017-09-22 DIAGNOSIS — G4733 Obstructive sleep apnea (adult) (pediatric): Secondary | ICD-10-CM | POA: Diagnosis present

## 2017-09-22 DIAGNOSIS — Z79899 Other long term (current) drug therapy: Secondary | ICD-10-CM | POA: Diagnosis not present

## 2017-09-22 DIAGNOSIS — E785 Hyperlipidemia, unspecified: Secondary | ICD-10-CM | POA: Diagnosis present

## 2017-09-22 DIAGNOSIS — E1122 Type 2 diabetes mellitus with diabetic chronic kidney disease: Secondary | ICD-10-CM | POA: Diagnosis present

## 2017-09-22 DIAGNOSIS — K859 Acute pancreatitis without necrosis or infection, unspecified: Secondary | ICD-10-CM | POA: Diagnosis not present

## 2017-09-22 LAB — CBC
HEMATOCRIT: 27.7 % — AB (ref 39.0–52.0)
HEMOGLOBIN: 9.2 g/dL — AB (ref 13.0–17.0)
MCH: 28.3 pg (ref 26.0–34.0)
MCHC: 33.2 g/dL (ref 30.0–36.0)
MCV: 85.2 fL (ref 78.0–100.0)
PLATELETS: 138 10*3/uL — AB (ref 150–400)
RBC: 3.25 MIL/uL — ABNORMAL LOW (ref 4.22–5.81)
RDW: 17.9 % — ABNORMAL HIGH (ref 11.5–15.5)
WBC: 5.5 10*3/uL (ref 4.0–10.5)

## 2017-09-22 LAB — RENAL FUNCTION PANEL
Albumin: 3 g/dL — ABNORMAL LOW (ref 3.5–5.0)
Anion gap: 18 — ABNORMAL HIGH (ref 5–15)
BUN: 81 mg/dL — ABNORMAL HIGH (ref 6–20)
CHLORIDE: 98 mmol/L — AB (ref 101–111)
CO2: 20 mmol/L — ABNORMAL LOW (ref 22–32)
CREATININE: 15.14 mg/dL — AB (ref 0.61–1.24)
Calcium: 8.6 mg/dL — ABNORMAL LOW (ref 8.9–10.3)
GFR calc Af Amer: 4 mL/min — ABNORMAL LOW (ref >60)
GFR, EST NON AFRICAN AMERICAN: 3 mL/min — AB (ref >60)
Glucose, Bld: 100 mg/dL — ABNORMAL HIGH (ref 65–99)
POTASSIUM: 5.2 mmol/L — AB (ref 3.5–5.1)
Phosphorus: 7.8 mg/dL — ABNORMAL HIGH (ref 2.5–4.6)
Sodium: 136 mmol/L (ref 135–145)

## 2017-09-22 LAB — HEPARIN LEVEL (UNFRACTIONATED)
Heparin Unfractionated: 0.21 IU/mL — ABNORMAL LOW (ref 0.30–0.70)
Heparin Unfractionated: 0.28 IU/mL — ABNORMAL LOW (ref 0.30–0.70)

## 2017-09-22 MED ORDER — CARVEDILOL 6.25 MG PO TABS
6.2500 mg | ORAL_TABLET | Freq: Two times a day (BID) | ORAL | Status: DC
  Administered 2017-09-22 – 2017-09-24 (×4): 6.25 mg via ORAL
  Filled 2017-09-22 (×4): qty 1

## 2017-09-22 MED ORDER — SODIUM CHLORIDE 0.9 % IV SOLN: 100.0000 mL | INTRAVENOUS | Status: DC | PRN

## 2017-09-22 MED ORDER — LIDOCAINE-PRILOCAINE 2.5-2.5 % EX CREA
1.0000 | TOPICAL_CREAM | CUTANEOUS | Status: DC | PRN
Start: 2017-09-22 — End: 2017-09-22

## 2017-09-22 MED ORDER — HEPARIN (PORCINE) IN NACL 100-0.45 UNIT/ML-% IJ SOLN
1650.0000 [IU]/h | INTRAMUSCULAR | Status: DC
  Administered 2017-09-22 – 2017-09-24 (×4): 1650 [IU]/h via INTRAVENOUS
  Filled 2017-09-22 (×5): qty 250

## 2017-09-22 MED ORDER — ALTEPLASE 2 MG IJ SOLR: 2.0000 mg | Freq: Once | INTRAMUSCULAR | Status: DC | PRN

## 2017-09-22 MED ORDER — PENTAFLUOROPROP-TETRAFLUOROETH EX AERO: 1.0000 "application " | INHALATION_SPRAY | CUTANEOUS | Status: DC | PRN

## 2017-09-22 MED ORDER — AMLODIPINE BESYLATE 10 MG PO TABS
10.0000 mg | ORAL_TABLET | Freq: Every day | ORAL | Status: DC
  Administered 2017-09-22 – 2017-09-24 (×3): 10 mg via ORAL
  Filled 2017-09-22 (×3): qty 1

## 2017-09-22 MED ORDER — HYDROMORPHONE HCL 1 MG/ML IJ SOLN
INTRAMUSCULAR | Status: AC
  Filled 2017-09-22: qty 0.5

## 2017-09-22 MED ORDER — ATORVASTATIN CALCIUM 10 MG PO TABS
10.0000 mg | ORAL_TABLET | Freq: Every day | ORAL | Status: DC
  Administered 2017-09-22 – 2017-09-24 (×3): 10 mg via ORAL
  Filled 2017-09-22 (×3): qty 1

## 2017-09-22 MED ORDER — LIDOCAINE HCL (PF) 1 % IJ SOLN: 5.0000 mL | INTRAMUSCULAR | Status: DC | PRN

## 2017-09-22 MED ORDER — HYDROMORPHONE HCL 1 MG/ML IJ SOLN
1.0000 mg | Freq: Once | INTRAMUSCULAR | Status: AC | PRN
  Administered 2017-09-22: 1 mg via INTRAVENOUS
  Filled 2017-09-22: qty 1

## 2017-09-22 MED ORDER — AMITRIPTYLINE HCL 10 MG PO TABS
10.0000 mg | ORAL_TABLET | Freq: Every day | ORAL | Status: DC
  Administered 2017-09-22 – 2017-09-24 (×3): 10 mg via ORAL
  Filled 2017-09-22 (×3): qty 1

## 2017-09-22 MED ORDER — OSELTAMIVIR PHOSPHATE 30 MG PO CAPS
30.0000 mg | ORAL_CAPSULE | ORAL | Status: AC
  Administered 2017-09-22 – 2017-09-24 (×2): 30 mg via ORAL
  Filled 2017-09-22 (×2): qty 1

## 2017-09-22 MED ORDER — IOPAMIDOL (ISOVUE-300) INJECTION 61%
INTRAVENOUS | Status: AC
  Administered 2017-09-22: 100 mL
  Filled 2017-09-22: qty 100

## 2017-09-22 MED ORDER — HEPARIN SODIUM (PORCINE) 1000 UNIT/ML DIALYSIS: 1000.0000 [IU] | INTRAMUSCULAR | Status: DC | PRN

## 2017-09-22 NOTE — Progress Notes (Signed)
CALL PAGER [phone] for any questions or notifications regarding this patient  FMTS Attending Daily Note: Frank Mcmurray MD  Attending [phone]   I  have seen and examined this patient, reviewed their chart. I have discussed this patient with the resident. I agree with the resident's findings, assessment and care plan. Please see their separate note for details CT scan completed for re-evaluation of pancreatic phlegmon---potential surgery depending on results (at [facility])

## 2017-09-22 NOTE — Progress Notes (Signed)
ANTICOAGULATION CONSULT NOTE - Follow up [facility] for heparin Indication:history of DVT  Allergies  Allergen Reactions  . Butalbital-Apap-Caffeine Shortness Of Breath, Swelling and Other (See Comments)    Swelling in throat  . Ferrlecit [Na Ferric Gluc Cplx In Sucrose] Shortness Of Breath, Swelling and Other (See Comments)    Swelling in throat, tolerates Venofor  . Minoxidil Shortness Of Breath  . Tylenol [Acetaminophen] Anaphylaxis and Swelling  . Darvocet [Propoxyphene N-Acetaminophen] Hives    Patient Measurements: Height: _0  (188 cm) Weight: 169 lb 12.1 oz (77 kg)(standing weight) IBW/kg (Calculated) : 82.2 Heparin Dosing Weight: 77 kg   Vital Signs: Temp: 99.9 F (37.7 C) (03/04 2100) Temp Source: Oral (03/04 2100) BP: 150/102 (03/04 2100) Pulse Rate: 94 (03/04 2100)  Labs: Recent Labs    09/21/17 0919 09/22/17 0500 09/22/17 2015  HGB 9.7* 9.2*  --   HCT 29.1* 27.7*  --   PLT 146* 138*  --   LABPROT 18.5*  --   --   INR 1.56  --   --   HEPARINUNFRC  --  0.21* 0.28*  CREATININE 12.65* 15.14*  --   TROPONINI 0.08*  --   --     Estimated Creatinine Clearance: 6.1 mL/min (A) (by C-G formula based on SCr of 15.14 mg/dL (H)).   Assessment: 52 y.o. male presenting with abdominal and chest pain. On warfarin PTA for history of recurrent DVT. INR is 1.56 on 3/3. Hemoglobin and platelets are low, stable. Pharmacy consulted to start heparin as patient to have pancreatic lesion drained.   7 hour heparin level is up to 0.28 tonight though level remains subtherapeutic after heparin rate increased to 1500 units/hr this afternoon. No bleeding noted a confirmed by RN. No interruptions in heparin infusion per RN's report.    Goal of Therapy:  Heparin level 0.3-0.7 units/ml Monitor platelets by anticoagulation protocol: Yes   Plan:  Increase Heparin infusion to 1650 units/hr (notified RN) Continue to monitor H&H and platelets  Check heparin level in 8  hours  Daily heparin level and CBC   Thank you for allowing [place] to participate in this patients care. Nicole Cella, RPh Clinical Pharmacist Pager: [phone] 09/22/2017 9:46 PM

## 2017-09-22 NOTE — Progress Notes (Addendum)
ANTICOAGULATION CONSULT NOTE - Follow up [facility] for heparin Indication:history of DVT  Allergies  Allergen Reactions  . Butalbital-Apap-Caffeine Shortness Of Breath, Swelling and Other (See Comments)    Swelling in throat  . Ferrlecit [Na Ferric Gluc Cplx In Sucrose] Shortness Of Breath, Swelling and Other (See Comments)    Swelling in throat, tolerates Venofor  . Minoxidil Shortness Of Breath  . Tylenol [Acetaminophen] Anaphylaxis and Swelling  . Darvocet [Propoxyphene N-Acetaminophen] Hives    Patient Measurements: Height: _0  (188 cm) Weight: 173 lb 8 oz (78.7 kg) IBW/kg (Calculated) : 82.2 Heparin Dosing Weight: 78.6 kg   Vital Signs: Temp: 98.4 F (36.9 C) (03/04 0753) Temp Source: Oral (03/04 0753) BP: 156/90 (03/04 1030) Pulse Rate: 104 (03/04 1030)  Labs: Recent Labs    09/21/17 0919 09/22/17 0500  HGB 9.7* 9.2*  HCT 29.1* 27.7*  PLT 146* 138*  LABPROT 18.5*  --   INR 1.56  --   HEPARINUNFRC  --  0.21*  CREATININE 12.65* 15.14*  TROPONINI 0.08*  --     Estimated Creatinine Clearance: 6.3 mL/min (A) (by C-G formula based on SCr of 15.14 mg/dL (H)).   Assessment: 52 y.o. male presenting with abdominal and chest pain. On warfarin PTA for history of recurrent DVT. INR is 1.56 on 3/3. Hemoglobin and platelets are low, stable. Pharmacy consulted to start heparin as patient to have pancreatic lesion drained.   Heparin level is subtherapeutic this morning at 0.21. Per RN, no trouble or interruption with infusion over night. Pt currently in hemodialysis, no bleeding noted. Notified HD RN to increase rate.  Goal of Therapy:  Heparin level 0.3-0.7 units/ml Monitor platelets by anticoagulation protocol: Yes   Plan:  Increase Heparin infusion 1500 units/hr Continue to monitor H&H and platelets  Check heparin level in 8 hours    Thank you for allowing [place] to participate in this patients care.  Jens Som, PharmD Clinical phone for  09/22/2017 from 7a-3:30p: [phone] If after 3:30p, please call main pharmacy at: [phone] 09/22/2017 10:50 AM

## 2017-09-22 NOTE — Progress Notes (Signed)
[name]: [phone]  Patient name: Frank [name] Medical record number: [id number] Date of birth: January 05, 1966 Age: 52 y.o. Gender: male  Primary Care Provider: Patient, [name] Consultants: nephro Code Status: full  Pt Overview and Major Events to Date:  Frank [name] is a 52 y.o. male presenting with abdominal pain with vomiting and diarrhea since Friday, also found to be influenza positive. PMH is significant for significant forESRDon MWF HD, HTN, HFrEF, pericardial effusion, NICM,DVT/PEon Coumadin, anxiety and depression, chronic pain syndrome, anemia of chronic disease, GERD, chronic pancreatitis, HLD, OSA, history of diabetes (last A1c 5.2 in October off medications).   Assessment and Plan: Frank [name] is a 52 y.o. male presenting with abdominal pain with vomiting and diarrhea since Friday, also found to be influenza positive. PMH is significant for significant forESRDon MWF HD, HTN, HFrEF, pericardial effusion, NICM,DVT/PEon Coumadin, anxiety and depression, chronic pain syndrome, anemia of chronic disease, GERD, chronic pancreatitis, HLD, OSA, history of diabetes (last A1c 5.2 in October off medications).   Abdominal Pain: May be flare of chronic pancreatitis given pain worst over upper abdomen. Lipase 21, which may be due to chronic nature, but lipase was elevated with last bout of increased abdominal pain. Could also be exacerbation by flu. He is passing multiple stools, so he is not obstructed. He was admitted last month from 08/24/17-09/04/17 for abdominal pain and was subsequently transferred to [facility] for possible endoscopic drainage of an enlarging pancreatic pseudocyst, as he was not a candidate for percutaneous drainage. Patient reports this was unable to be done because walls of cyst not thick enough. Reports pain with palpation of chest wall. EKG unchanged from prior, though QT slightly more prolonged. Troponin 0.08 (last  values 0.09 in January).   - Admit to med-surg under inpatient status, attending Dr. Nori Riis - CT abdomen pelvis with contrast, as was to be performed this week anyway and want to rule out tissue necrosis, though vital signs are stable making this less likely - Dilaudid 0.5 mg q3hrs prn for pain (with additional prn for the CT) - Zofranprnfor nausea - sips with meds - Vitals per routine  Hypoxia:satting mid 90s on 2Lnc. He does not use oxygen at home. Chronic pulmonary venous congestion on CXR. positive flu.  Did have elevated LA to 2.21 but improved to 1.33 with fluids (IV abx). Inspiratory wheezes over left posterior lung fields on exam.  - Wean O2 as tolerated - Albuterol prn for wheezing - Provide supportive treatment for cough once able to tolerate PO  ESRD on MWF TD:VVOHYWV with [facility].   patient does not appear volume overloaded - Continue tamiflu 30 mg after every HD session for next 5 days (assuming 3 sessions) - Holding home Renvela 2,479m tid until able to take PO - Nephro consulted for dialysis MWF, appreciate recommendations  HTN: BP up to 1371Gsystolic Likely elevated due to pain and not being able to keep down BP meds at home. - restarted Norvasc 18mdaily, Coreg 25 mg bid - Labetalol IV 1068mrn for SBP > 160 or DBP > 100 - Add IV Hydralazine prn  HFrEF / NICM:ECHO 08/27/17 with EF of 20-25% G3DD and moderate pericardial effusion, significantly worse compared to ECHO on 10/31/15 with EF 35-40%. - restarted  Coreg 72m98md  -holding Imdur 60mg26mFluid status management with HD  Hx DVT/PE:Has had multiple DVTs in the past. On Coumadin at home. - Heparin per pharm -  INR 1.56 in setting of missed doses - Contact [facility] about holding coumadin after CT so they can direct anticoagulation  Anxiety/depression:Chronic, stable. No SI/HI. -restart Amitriptyline   Chronic Pain Syndrome: - Restart home Amitriptyline 9m   Anemia of Chronic  Disease:Likely related to CKD. Hgb 10.5 on admission (baseline ~10) - Trend Hgb  OOHK:GOVPCH- CPAP qhs  GERD: On home Protonix 456mbid - IV protonix  FEN/GI: light fluids given reduced EF, NPO until CT scan returns Prophylaxis: Heparin per pharmacy (normally on coumadin but to have possible IR procedure 09/25/17).   Disposition: to home once medical cleared w/ [facility]  Subjective:  Patient in pain but confirms he wants CT and will not refuse.   He was on HD when interviewed   Objective: Temp:  [98.1 F (36.7 C)-100.4 F (38 C)] 98.4 F (36.9 C) (03/04 0753) Pulse Rate:  [95-106] 102 (03/04 0900) Resp:  [14-24] 21 (03/04 0900) BP: (129-172)/(58-116) 144/82 (03/04 0900) SpO2:  [86 %-100 %] 95 % (03/04 0422) Weight:  [173 lb 4.5 oz (78.6 kg)-173 lb 8 oz (78.7 kg)] 173 lb 8 oz (78.7 kg) (03/04 0753) Physical Exam: General: very uncomfortable but not toxic appearing on HD. Cardiovascular: referred sounds from HD, regular rate, no chest pain Respiratory: course lung sounds, no IWB, some coughing  Abdomen: extremely tender to palpation, not firm/distended Extremities: no lesions/rashes/deficits noted  Laboratory: Recent Labs  Lab 09/21/17 0919 09/22/17 0500  WBC 6.0 5.5  HGB 9.7* 9.2*  HCT 29.1* 27.7*  PLT 146* 138*   Recent Labs  Lab 09/21/17 0919  NA 138  K 5.2*  CL 96*  CO2 21*  BUN 68*  CREATININE 12.65*  CALCIUM 9.3  PROT 8.0  BILITOT 1.2  ALKPHOS 91  ALT 14*  AST 27  GLUCOSE 110*      Imaging/Diagnostic Tests: Dg Chest 2 View  Result Date: 09/21/2017 CLINICAL DATA:  Weakness following dialysis treatment. EXAM: CHEST  2 VIEW COMPARISON:  08/24/2017; 07/21/2017; 06/13/2017; chest CT-06/24/2017 FINDINGS: Grossly unchanged enlarged cardiac silhouette and mediastinal contours. Chronic pulmonary venous congestion without frank evidence of edema. No new focal airspace opacities. No pleural effusion, though a small amount  of fluid is seen tracking within the bilateral major and the right minor fissures. No pneumothorax. No acute osseus abnormalities. IMPRESSION: Similar findings cardiomegaly and pulmonary venous congestion without superimposed acute cardiopulmonary disease. Electronically Signed   By: JoSandi Mariscal.D.   On: 09/21/2017 09:05     BlSherene SiresDO 09/22/2017, 9:25 AM PGY-1, Co[place] pager: [phone] pages welcome

## 2017-09-22 NOTE — Progress Notes (Signed)
[facility] Progress Note  Subjective: on HD, coughing and chills still   Vitals:   09/22/17 1100 09/22/17 1130 09/22/17 1200 09/22/17 1219  BP: (!) 151/94 (!) 147/71 (!) 155/102 (!) 154/94  Pulse: 98 98 99 96  Resp: (!) 21 18 (!) 21 16  Temp:    99.1 F (37.3 C)  TempSrc:    Oral  SpO2:    96%  Weight:    77 kg (169 lb 12.1 oz)  Height:        Inpatient medications: . oseltamivir  30 mg Oral Q M,W,F-HD  . pantoprazole (PROTONIX) IV  40 mg Intravenous Daily   . heparin 1,500 Units/hr (09/22/17 1322)   albuterol, HYDROmorphone (DILAUDID) injection, HYDROmorphone (DILAUDID) injection, labetalol, ondansetron **OR** ondansetron (ZOFRAN) IV  Exam: Alert, coughing , no distress No jvd Chest coarse rhonchi bilat RRR no rg Abd soft ntnd Ext no leg edema LUA AVF+bruit NF, ox3   Dialysis: MWF  [facility] 4[facility]  77.5kg  2/2.25 bath   No UF prof  LUA AVF  Heparin ? -hect 8 ug -venofer 50 /wk      Impression: 1  Influenza PNA - restarted tamiflu at renal dosing for 5 days total 2  ESRD HD MWF 3  HTN - will resume his home BP meds x 3 4  Panc pseudocyst/ phlegmon - f/b [facility], they are supposed to operate on this later this week according to pt 5  Hx DVT on coumadin 6  Non compliance 7  Volume is at dry wt  Plan - HD today, UF 1-2 L as tolerated   Kelly Splinter MD [facility] pager [phone]   09/22/2017, 1:29 PM   Recent Labs  Lab 09/21/17 0919 09/22/17 0500  NA 138 136  K 5.2* 5.2*  CL 96* 98*  CO2 21* 20*  GLUCOSE 110* 100*  BUN 68* 81*  CREATININE 12.65* 15.14*  CALCIUM 9.3 8.6*  PHOS  --  7.8*   Recent Labs  Lab 09/21/17 0919 09/22/17 0500  AST 27  --   ALT 14*  --   ALKPHOS 91  --   BILITOT 1.2  --   PROT 8.0  --   ALBUMIN 3.3* 3.0*   Recent Labs  Lab 09/21/17 0919 09/22/17 0500  WBC 6.0 5.5  NEUTROABS 4.8  --   HGB 9.7* 9.2*  HCT 29.1* 27.7*  MCV 85.3 85.2  PLT 146* 138*   Iron/TIBC/Ferritin/ %Sat     Component Value Date/Time   IRON 41 (L) 05/29/2016 1350   TIBC 190 (L) 05/29/2016 1350   FERRITIN 328 11/02/2015 0346   IRONPCTSAT 22 05/29/2016 1350

## 2017-09-22 NOTE — Progress Notes (Signed)
Pt placed on CPAP at this time. Pt tolerating it well.

## 2017-09-23 LAB — MRSA CULTURE: Culture: NOT DETECTED

## 2017-09-23 LAB — HEPARIN LEVEL (UNFRACTIONATED)
Heparin Unfractionated: 0.35 IU/mL (ref 0.30–0.70)
Heparin Unfractionated: 0.4 IU/mL (ref 0.30–0.70)

## 2017-09-23 LAB — CBC
HEMATOCRIT: 27.6 % — AB (ref 39.0–52.0)
Hemoglobin: 9 g/dL — ABNORMAL LOW (ref 13.0–17.0)
MCH: 27.5 pg (ref 26.0–34.0)
MCHC: 32.6 g/dL (ref 30.0–36.0)
MCV: 84.4 fL (ref 78.0–100.0)
Platelets: 104 10*3/uL — ABNORMAL LOW (ref 150–400)
RBC: 3.27 MIL/uL — ABNORMAL LOW (ref 4.22–5.81)
RDW: 17.6 % — AB (ref 11.5–15.5)
WBC: 4.3 10*3/uL (ref 4.0–10.5)

## 2017-09-23 MED ORDER — SEVELAMER CARBONATE 800 MG PO TABS
2400.0000 mg | ORAL_TABLET | Freq: Three times a day (TID) | ORAL | Status: DC
  Administered 2017-09-23 – 2017-09-24 (×4): 2400 mg via ORAL
  Filled 2017-09-23 (×4): qty 3

## 2017-09-23 MED ORDER — PHENOL 1.4 % MT LIQD
1.0000 | OROMUCOSAL | Status: DC | PRN
Start: 2017-09-23 — End: 2017-09-25
  Administered 2017-09-23: 1 via OROMUCOSAL

## 2017-09-23 MED ORDER — HYDRALAZINE HCL 50 MG PO TABS
100.0000 mg | ORAL_TABLET | Freq: Two times a day (BID) | ORAL | Status: DC
  Administered 2017-09-23 – 2017-09-24 (×4): 100 mg via ORAL
  Filled 2017-09-23 (×4): qty 2

## 2017-09-23 MED ORDER — PATIROMER SORBITEX CALCIUM 8.4 G PO PACK: 8.4000 g | PACK | Freq: Every day | ORAL | Status: DC

## 2017-09-23 MED ORDER — PATIROMER SORBITEX CALCIUM 8.4 G PO PACK
8.4000 g | PACK | Freq: Every day | ORAL | Status: DC
  Filled 2017-09-23: qty 4

## 2017-09-23 MED ORDER — HYDROMORPHONE HCL 1 MG/ML IJ SOLN
1.0000 mg | INTRAMUSCULAR | Status: DC | PRN
  Administered 2017-09-23 – 2017-09-24 (×10): 1 mg via INTRAVENOUS
  Filled 2017-09-23 (×9): qty 1

## 2017-09-23 NOTE — Progress Notes (Addendum)
ANTICOAGULATION CONSULT NOTE - Follow up [place] for Heparin Indication:history of DVT  Allergies  Allergen Reactions  . Butalbital-Apap-Caffeine Shortness Of Breath, Swelling and Other (See Comments)    Swelling in throat  . Ferrlecit [Na Ferric Gluc Cplx In Sucrose] Shortness Of Breath, Swelling and Other (See Comments)    Swelling in throat, tolerates Venofor  . Minoxidil Shortness Of Breath  . Tylenol [Acetaminophen] Anaphylaxis and Swelling  . Darvocet [Propoxyphene N-Acetaminophen] Hives    Patient Measurements: Height: _0  (188 cm) Weight: 169 lb 12.1 oz (77 kg)(standing weight) IBW/kg (Calculated) : 82.2 Heparin Dosing Weight: 78.6 kg   Vital Signs: Temp: 98.1 F (36.7 C) (03/05 0608) Temp Source: Oral (03/05 0608) BP: 138/88 (03/05 0608) Pulse Rate: 82 (03/05 0608)  Labs: Recent Labs    09/21/17 0919  09/22/17 0500 09/22/17 2015 09/23/17 0419 09/23/17 1121  HGB 9.7*  --  9.2*  --  9.0*  --   HCT 29.1*  --  27.7*  --  27.6*  --   PLT 146*  --  138*  --  104*  --   LABPROT 18.5*  --   --   --   --   --   INR 1.56  --   --   --   --   --   HEPARINUNFRC  --    < > 0.21* 0.28* 0.35 0.40  CREATININE 12.65*  --  15.14*  --   --   --   TROPONINI 0.08*  --   --   --   --   --    < > = values in this interval not displayed.    Estimated Creatinine Clearance: 6.1 mL/min (A) (by C-G formula based on SCr of 15.14 mg/dL (H)).   Assessment: 52 y.o. male presenting with abdominal and chest pain. On warfarin PTA for history of recurrent DVT. INR is 1.56 on 3/3. Pharmacy consulted to start heparin as patient to have pancreatic lesion drained.    Repeat Heparin level this afternoon remains therapeutic.  H/H is stable. Platelets noted to be trending down (baseline ~140). No bleeding noted.   Goal of Therapy:  Heparin level 0.3-0.7 units/ml Monitor platelets by anticoagulation protocol: Yes   Plan:  Continute heparin at 1650 units/hr Daily  heparin level and CBC while on therapy Monitor Platelets closely  Sloan Leiter, PharmD, BCPS, BCCCP Clinical Pharmacist Clinical phone 09/23/2017 until 3:30PM - #[phone] After hours, please call [phone] 09/23/2017, 12:29 PM

## 2017-09-23 NOTE — Progress Notes (Signed)
ANTICOAGULATION CONSULT NOTE - Follow up [place] for Heparin Indication:history of DVT  Allergies  Allergen Reactions  . Butalbital-Apap-Caffeine Shortness Of Breath, Swelling and Other (See Comments)    Swelling in throat  . Ferrlecit [Na Ferric Gluc Cplx In Sucrose] Shortness Of Breath, Swelling and Other (See Comments)    Swelling in throat, tolerates Venofor  . Minoxidil Shortness Of Breath  . Tylenol [Acetaminophen] Anaphylaxis and Swelling  . Darvocet [Propoxyphene N-Acetaminophen] Hives    Patient Measurements: Height: 6' 2" (188 cm) Weight: 169 lb 12.1 oz (77 kg)(standing weight) IBW/kg (Calculated) : 82.2 Heparin Dosing Weight: 78.6 kg   Vital Signs: Temp: 99.9 F (37.7 C) (03/04 2100) Temp Source: Oral (03/04 2100) BP: 150/102 (03/04 2100) Pulse Rate: 94 (03/04 2100)  Labs: Recent Labs    09/21/17 0919 09/22/17 0500 09/22/17 2015 09/23/17 0419  HGB 9.7* 9.2*  --  9.0*  HCT 29.1* 27.7*  --  27.6*  PLT 146* 138*  --  PENDING  LABPROT 18.5*  --   --   --   INR 1.56  --   --   --   HEPARINUNFRC  --  0.21* 0.28* 0.35  CREATININE 12.65* 15.14*  --   --   TROPONINI 0.08*  --   --   --     Estimated Creatinine Clearance: 6.1 mL/min (A) (by C-G formula based on SCr of 15.14 mg/dL (H)).   Assessment: 52 y.o. male presenting with abdominal and chest pain. On warfarin PTA for history of recurrent DVT. INR is 1.56 on 3/3. Hemoglobin and platelets are low, stable. Pharmacy consulted to start heparin as patient to have pancreatic lesion drained.    Heparin level this AM is therapeutic after rate increase  Goal of Therapy:  Heparin level 0.3-0.7 units/ml Monitor platelets by anticoagulation protocol: Yes   Plan:  Cont heparin at 1650 units/hr 1200 HL  Narda Bonds, PharmD, BCPS Clinical Pharmacist Phone: [phone]

## 2017-09-23 NOTE — Progress Notes (Signed)
Report called to [facility] on 31M for patient transfer to [phone]

## 2017-09-23 NOTE — Progress Notes (Signed)
[name]: [phone]  Patient name: MELECIO CUETO Medical record number: [id number] Date of birth: January 05, 1966 Age: 52 y.o. Gender: male  Primary Care Provider: Patient, [name] Consultants: nephro Code Status: full  Pt Overview and Major Events to Date:  REDELL NAZIR is a 52 y.o. male presenting with abdominal pain with vomiting and diarrhea since Friday, also found to be influenza positive. PMH is significant for significant forESRDon MWF HD, HTN, HFrEF, pericardial effusion, NICM,DVT/PEon Coumadin, anxiety and depression, chronic pain syndrome, anemia of chronic disease, GERD, chronic pancreatitis, HLD, OSA, history of diabetes (last A1c 5.2 in October off medications).   Assessment and Plan: SHAMAR ENGELMANN is a 52 y.o. male presenting with abdominal pain with vomiting and diarrhea since Friday, also found to be influenza positive. PMH is significant for significant forESRDon MWF HD, HTN, HFrEF, pericardial effusion, NICM,DVT/PEon Coumadin, anxiety and depression, chronic pain syndrome, anemia of chronic disease, GERD, chronic pancreatitis, HLD, OSA, history of diabetes (last A1c 5.2 in October off medications).   Abdominal Pain: May be flare of chronic pancreatitis given pain worst over upper abdomen. Lipase 21, which may be due to chronic nature, but lipase was elevated with last bout of increased abdominal pain. Could also be exacerbation by flu. He is passing multiple stools, so he is not obstructed. He was admitted last month from 08/24/17-09/04/17 for abdominal pain and was subsequently transferred to [facility] for possible endoscopic drainage of an enlarging pancreatic pseudocyst, as he was not a candidate for percutaneous drainage. Patient reports this was unable to be done because walls of cyst not thick enough. Reports pain with palpation of chest wall. EKG unchanged from prior, though QT slightly more prolonged. Troponin 0.08 (last  values 0.09 in January).   - Admit to med-surg under inpatient status, attending Dr. Nori Riis - CT abdomen pelvis with contrast, as was to be performed this week anyway and want to rule out tissue necrosis, though vital signs are stable making this less likely - Dilaudid 0.5 mg q3hrs prn for pain (with additional prn for the CT) - Zofranprnfor nausea - sips with meds - Vitals per routine  Hypoxia:satting mid 90s on 2Lnc. He does not use oxygen at home. Chronic pulmonary venous congestion on CXR. positive flu.  Did have elevated LA to 2.21 but improved to 1.33 with fluids (IV abx). Inspiratory wheezes over left posterior lung fields on exam.  - Wean O2 as tolerated - Albuterol prn for wheezing - Provide supportive treatment for cough once able to tolerate PO  ESRD on MWF TD:VVOHYWV with [facility].   patient does not appear volume overloaded - Continue tamiflu 30 mg after every HD session for next 5 days (assuming 3 sessions) - Holding home Renvela 2,479m tid until able to take PO - Nephro consulted for dialysis MWF, appreciate recommendations  HTN: BP up to 1371Gsystolic Likely elevated due to pain and not being able to keep down BP meds at home. - restarted Norvasc 18mdaily, Coreg 25 mg bid - Labetalol IV 1068mrn for SBP > 160 or DBP > 100 - Add IV Hydralazine prn  HFrEF / NICM:ECHO 08/27/17 with EF of 20-25% G3DD and moderate pericardial effusion, significantly worse compared to ECHO on 10/31/15 with EF 35-40%. - restarted  Coreg 72m98md  -holding Imdur 60mg26mFluid status management with HD  Hx DVT/PE:Has had multiple DVTs in the past. On Coumadin at home. - Heparin per pharm -  INR 1.56 in setting of missed doses - Contact [facility] about holding coumadin after CT so they can direct anticoagulation  Anxiety/depression:Chronic, stable. No SI/HI. -restart Amitriptyline   Chronic Pain Syndrome: - Restart home Amitriptyline 4m   Anemia of Chronic  Disease:Likely related to CKD. Hgb 10.5 on admission (baseline ~10) - Trend Hgb  OPJA:SNKNLZ- CPAP qhs  GERD: On home Protonix 474mbid - IV protonix  FEN/GI: light fluids given reduced EF, NPO until CT scan returns Prophylaxis: Heparin per pharmacy (normally on coumadin but to have possible IR procedure 09/25/17).   Disposition: to home once medical cleared w/ [facility]  Subjective:  Patient wants more pain control and is concerned about getting to [facility] for his procedure  Objective: Temp:  [98.1 F (36.7 C)-99.9 F (37.7 C)] 98.1 F (36.7 C) (03/05 067673Pulse Rate:  [82-104] 82 (03/05 0608) Resp:  [16-21] 20 (03/05 0608) BP: (138-159)/(71-103) 138/88 (03/05 0608) SpO2:  [93 %-96 %] 93 % (03/05 0608) Weight:  [169 lb 12.1 oz (77 kg)] 169 lb 12.1 oz (77 kg) (03/04 1219) Physical Exam: General: very uncomfortable but not toxic appearing  Cardiovascular: referred sounds from HD, regular rate, no chest pain Respiratory: course lung sounds, no IWB, some coughing  Abdomen: extremely tender to palpation, not firm/distended Extremities: no lesions/rashes/deficits noted  Laboratory: Recent Labs  Lab 09/21/17 0919 09/22/17 0500 09/23/17 0419  WBC 6.0 5.5 4.3  HGB 9.7* 9.2* 9.0*  HCT 29.1* 27.7* 27.6*  PLT 146* 138* 104*   Recent Labs  Lab 09/21/17 0919 09/22/17 0500  NA 138 136  K 5.2* 5.2*  CL 96* 98*  CO2 21* 20*  BUN 68* 81*  CREATININE 12.65* 15.14*  CALCIUM 9.3 8.6*  PROT 8.0  --   BILITOT 1.2  --   ALKPHOS 91  --   ALT 14*  --   AST 27  --   GLUCOSE 110* 100*      Imaging/Diagnostic Tests: Dg Chest 2 View  Result Date: 09/21/2017 CLINICAL DATA:  Weakness following dialysis treatment. EXAM: CHEST  2 VIEW COMPARISON:  08/24/2017; 07/21/2017; 06/13/2017; chest CT-06/24/2017 FINDINGS: Grossly unchanged enlarged cardiac silhouette and mediastinal contours. Chronic pulmonary venous congestion without frank evidence of  edema. No new focal airspace opacities. No pleural effusion, though a small amount of fluid is seen tracking within the bilateral major and the right minor fissures. No pneumothorax. No acute osseus abnormalities. IMPRESSION: Similar findings cardiomegaly and pulmonary venous congestion without superimposed acute cardiopulmonary disease. Electronically Signed   By: JoSandi Mariscal.D.   On: 09/21/2017 09:05     BlSherene SiresDO 09/23/2017, 9:51 AM PGY-1, Co[place] pager: [phone] pages welcome

## 2017-09-23 NOTE — Progress Notes (Addendum)
I was informed by nurse tech Remo Lipps patient is requesting for pain medication. Upon entering patient's room patient got upset with me stating I should have brought his pain medication at 0215. I informed patient that this is a PRN medication and I don't have to bring in pain medication when he is sleeping. Patient insisted that all the nurses have been bring him pain medication even though he doesn't call for it. I informed patient again PRN medication means as needed and its not a scheduled medication patient automatically gets. Patient also got upset because when I was giving him pain medication I did not kink his IV line neither did I disconnect his IV.  I informed patient heparin is compatible with dilaudid and there is no need to kink the line nor disconnect the IV line before giving him pain medication. Patient also informed that frequent disconnecting and reconnecting of IV line exposes patient to infection. Charge nurse called into patient room to better explain to patient, however patient did not listen stating this is what all the nurses have been doing. I informed patient I am not here to follow what every nurse   is doing.

## 2017-09-23 NOTE — Progress Notes (Signed)
[facility] Progress Note  Subjective: on HD, LUQ pain, not new, no new c/o's.   Vitals:   09/22/17 1219 09/22/17 1348 09/22/17 2100 09/23/17 0608  BP: (!) 154/94 (!) 159/103 (!) 150/102 138/88  Pulse: 96 99 94 82  Resp: _0 Temp: 99.1 F (37.3 C) 99.3 F (37.4 C) 99.9 F (37.7 C) 98.1 F (36.7 C)  TempSrc: Oral Oral Oral Oral  SpO2: 96% 94% 94% 93%  Weight: 77 kg (169 lb 12.1 oz)     Height:        Inpatient medications: . amitriptyline  10 mg Oral QHS  . amLODipine  10 mg Oral QHS  . atorvastatin  10 mg Oral q1800  . carvedilol  6.25 mg Oral BID WC  . oseltamivir  30 mg Oral Q M,W,F-HD  . pantoprazole (PROTONIX) IV  40 mg Intravenous Daily  . patiromer  8.4 g Oral Daily  . sevelamer carbonate  2,400 mg Oral TID WC   . heparin 1,650 Units/hr (09/22/17 2213)   albuterol, HYDROmorphone (DILAUDID) injection, labetalol, ondansetron **OR** ondansetron (ZOFRAN) IV, phenol  Exam: Alert, coughing , no distress No jvd Chest coarse rhonchi bilat RRR no rg Abd soft ntnd Ext no leg edema LUA AVF+bruit NF, ox3   Dialysis: MWF  [facility] 4h  77.5kg  2/2.25 bath   LUA AVF  Heparin none -hect 8 ug -venofer 50 /wk      Impression: 1  Influenza PNA - tamiflu at renal dosing for 5 days total 2  ESRD HD MWF 3  HTN - cont home BP meds x 3 4  Panc pseudocyst/ phlegmon - f/b [facility], they are supposed to operate on this later this week according to pt 5  Hx DVT on coumadin 6  Non compliance 7  Volume is at dry wt 8  Hyperkalemia - chronic issue, long-term, will try Veltassa once daily  Plan - HD Wed if still here   Kelly Splinter MD [facility] pager [phone]   09/23/2017, 1:16 PM   Recent Labs  Lab 09/21/17 0919 09/22/17 0500  NA 138 136  K 5.2* 5.2*  CL 96* 98*  CO2 21* 20*  GLUCOSE 110* 100*  BUN 68* 81*  CREATININE 12.65* 15.14*  CALCIUM 9.3 8.6*  PHOS  --  7.8*   Recent Labs  Lab 09/21/17 0919 09/22/17 0500  AST 27   --   ALT 14*  --   ALKPHOS 91  --   BILITOT 1.2  --   PROT 8.0  --   ALBUMIN 3.3* 3.0*   Recent Labs  Lab 09/21/17 0919 09/22/17 0500 09/23/17 0419  WBC 6.0 5.5 4.3  NEUTROABS 4.8  --   --   HGB 9.7* 9.2* 9.0*  HCT 29.1* 27.7* 27.6*  MCV 85.3 85.2 84.4  PLT 146* 138* 104*   Iron/TIBC/Ferritin/ %Sat    Component Value Date/Time   IRON 41 (L) 05/29/2016 1350   TIBC 190 (L) 05/29/2016 1350   FERRITIN 328 11/02/2015 0346   IRONPCTSAT 22 05/29/2016 1350

## 2017-09-24 LAB — RENAL FUNCTION PANEL
ALBUMIN: 3 g/dL — AB (ref 3.5–5.0)
Anion gap: 18 — ABNORMAL HIGH (ref 5–15)
BUN: 58 mg/dL — AB (ref 6–20)
CALCIUM: 8.6 mg/dL — AB (ref 8.9–10.3)
CHLORIDE: 94 mmol/L — AB (ref 101–111)
CO2: 21 mmol/L — ABNORMAL LOW (ref 22–32)
CREATININE: 12.11 mg/dL — AB (ref 0.61–1.24)
GFR, EST AFRICAN AMERICAN: 5 mL/min — AB (ref >60)
GFR, EST NON AFRICAN AMERICAN: 4 mL/min — AB (ref >60)
Glucose, Bld: 90 mg/dL (ref 65–99)
PHOSPHORUS: 7 mg/dL — AB (ref 2.5–4.6)
Potassium: 4.5 mmol/L (ref 3.5–5.1)
SODIUM: 133 mmol/L — AB (ref 135–145)

## 2017-09-24 LAB — CBC
HCT: 29.1 % — ABNORMAL LOW (ref 39.0–52.0)
HEMOGLOBIN: 9.7 g/dL — AB (ref 13.0–17.0)
MCH: 28.1 pg (ref 26.0–34.0)
MCHC: 33.3 g/dL (ref 30.0–36.0)
MCV: 84.3 fL (ref 78.0–100.0)
PLATELETS: 116 10*3/uL — AB (ref 150–400)
RBC: 3.45 MIL/uL — AB (ref 4.22–5.81)
RDW: 17.6 % — ABNORMAL HIGH (ref 11.5–15.5)
WBC: 3 10*3/uL — AB (ref 4.0–10.5)

## 2017-09-24 LAB — HEPARIN LEVEL (UNFRACTIONATED): HEPARIN UNFRACTIONATED: 0.41 [IU]/mL (ref 0.30–0.70)

## 2017-09-24 MED ORDER — PATIROMER SORBITEX CALCIUM 8.4 G PO PACK
8.4000 g | PACK | Freq: Every day | ORAL | Status: DC
  Administered 2017-09-24: 8.4 g via ORAL
  Filled 2017-09-24: qty 4

## 2017-09-24 MED ORDER — HYDROMORPHONE HCL 1 MG/ML IJ SOLN
INTRAMUSCULAR | Status: AC
  Filled 2017-09-24: qty 1

## 2017-09-24 MED ORDER — HYDROMORPHONE HCL 1 MG/ML IJ SOLN: 0.5000 mg | INTRAMUSCULAR | Status: DC | PRN

## 2017-09-24 MED ORDER — HEPARIN (PORCINE) IN NACL 100-0.45 UNIT/ML-% IJ SOLN: 1650.0000 [IU]/h | INTRAMUSCULAR | Status: DC

## 2017-09-24 NOTE — Progress Notes (Signed)
ANTICOAGULATION CONSULT NOTE - Follow up [place] for Heparin Indication:history of DVT  Allergies  Allergen Reactions  . Butalbital-Apap-Caffeine Shortness Of Breath, Swelling and Other (See Comments)    Swelling in throat  . Ferrlecit [Na Ferric Gluc Cplx In Sucrose] Shortness Of Breath, Swelling and Other (See Comments)    Swelling in throat, tolerates Venofor  . Minoxidil Shortness Of Breath  . Tylenol [Acetaminophen] Anaphylaxis and Swelling  . Darvocet [Propoxyphene N-Acetaminophen] Hives   Patient Measurements: Height: _0  (188 cm) Weight: 171 lb 8.3 oz (77.8 kg) IBW/kg (Calculated) : 82.2 Heparin Dosing Weight: 78.6 kg   Assessment: 52 y.o. male presenting with abdominal and chest pain.  On Coumadin 14m daily PTA, However patient not taking. INR on admit was 1.56. Now on heparin to bridge until procedure on Thursday. Heparin level remains therapeutic at 0.41. Hgb 9.7, plts low but stable at 116.   Goal of Therapy:  Heparin level 0.3-0.7 units/ml Monitor platelets by anticoagulation protocol: Yes   Plan:  Continue heparin gtt at 1,650 units/hr Monitor daily heparin level, CBC, s/s of bleed  NElenor Quinones PharmD, BCPS Clinical Pharmacist Pager [phone]/12/2017 8:21 AM

## 2017-09-24 NOTE — Progress Notes (Signed)
RT NOTE:  Pt stated he is able to put CPAP on when he is ready for sleep. Pt understands to call RT if needed.

## 2017-09-24 NOTE — Progress Notes (Signed)
  Patient transferred to [facility] via transport.([facility]).

## 2017-09-24 NOTE — Discharge Summary (Signed)
[facility] Discharge Summary  Patient name: Frank [name] Medical record number: [id number] Date of birth: October 18, 1965 Age: 52 y.o. Gender: male Date of Admission: 09/21/2017  Date of Discharge: 09/24/17 Admitting Physician: Blane Ohara McDiarmid, MD  Primary Care Provider: Patient, [name] Consultants: nephro  Indication for Hospitalization: abdominal pain secondary to pancreatic pseudocyst  Discharge Diagnoses/Problem List:  Pancreatic pseudocyst ESRD (MWF w/ [name]) Flu pos HTN HFrEF 25-30% G3DD Hx of DVT/PE Anxiety/depression Chronic Pain Anemia of Chronic Disease OSA GERD  Disposition: transfer to [facility]  Discharge Condition: stable  Discharge Exam: General: uncomfortable but not toxic appearing, conversational Cardiovascular: referred sounds from fistula, regular rate, no chest pain Respiratory: course lung sounds, no IWB, some coughing  Abdomen: extremely tender to palpation, slightly firm and distended Extremities: no lesions/rashes/deficits noted, does have AV fistula on arm  Brief Hospital Course:  Patient admitted for abdominal pain and SOB.   He has been under evaluation for >1 month for chronic pancreatitis and pancreatic psuedocyst that was determined to be immature for drainage by [facility] approximately 1 month ago.  At this admission, he was also found to be flu pos and needed oxygen supplementation of 2L [place]. His dialysis is normally MWF so tamiflu was administered after each session of HD. O2 requirement resolved after flu treatment. CT imaging confirmed large pseudocyst and [facility] was contacted to transfer patient for further treatment of pancreatic pseudocyst. Patient was maintained on IV heparin rather than home warfarin in preparation for possible procedure.   Issues for Follow Up:  1. Pancreatic pseudocyst:  Pain control has been 20m dilaudid Q3.  Patient has progressed from initial NPO to regular diet. 2. Flu-  patient has received 2x doses of tamiflu after dialysis sessions with resolving oxygen requirement.  He is now satting mid 95son room air but still has course lung sounds. 3. ERSD:  Patient is a chronic HD [facility]. Anticoagulation:  Patient has a hx of DVT/PE and is chronically anticoagulated.   His home regimen was coumadin but he was transitioned to heparin during this admission in anticipation of a procedure.   Please consider this condition in your planning.  Significant Procedures: MWF scheduled HD for ESRD, CT abdomen  Significant Labs and Imaging:  Recent Labs  Lab 09/22/17 0500 09/23/17 0419 09/24/17 0507  WBC 5.5 4.3 3.0*  HGB 9.2* 9.0* 9.7*  HCT 27.7* 27.6* 29.1*  PLT 138* 104* 116*   Recent Labs  Lab 09/21/17 0919 09/22/17 0500 09/24/17 0901  NA 138 136 133*  K 5.2* 5.2* 4.5  CL 96* 98* 94*  CO2 21* 20* 21*  GLUCOSE 110* 100* 90  BUN 68* 81* 58*  CREATININE 12.65* 15.14* 12.11*  CALCIUM 9.3 8.6* 8.6*  PHOS  --  7.8* 7.0*  ALKPHOS 91  --   --   AST 27  --   --   ALT 14*  --   --   ALBUMIN 3.3* 3.0* 3.0*    Dg Chest 2 View  Result Date: 09/21/2017 CLINICAL DATA:  Weakness following dialysis treatment. EXAM: CHEST  2 VIEW COMPARISON:  08/24/2017; 07/21/2017; 06/13/2017; chest CT-06/24/2017 FINDINGS: Grossly unchanged enlarged cardiac silhouette and mediastinal contours. Chronic pulmonary venous congestion without frank evidence of edema. No new focal airspace opacities. No pleural effusion, though a small amount of fluid is seen tracking within the bilateral major and the right minor fissures. No pneumothorax. No acute  osseus abnormalities. IMPRESSION: Similar findings cardiomegaly and pulmonary venous congestion without superimposed acute cardiopulmonary disease. Electronically Signed   By: Sandi Mariscal M.D.   On: 09/21/2017 09:05   Ct Abdomen Pelvis W Contrast  Result Date: 09/22/2017 CLINICAL DATA:  52 year old male with  chronic pancreatitis. Due to have drainage of pancreatic pseudocyst. Subsequent encounter. EXAM: CT ABDOMEN AND PELVIS WITH CONTRAST TECHNIQUE: Multidetector CT imaging of the abdomen and pelvis was performed using the standard protocol following bolus administration of intravenous contrast. CONTRAST:  142m ISOVUE-300 IOPAMIDOL (ISOVUE-300) INJECTION 61% COMPARISON:  08/25/2017, 08/08/2017, 06/13/2017, 07/29/2016 and 10/30/2015 CT. FINDINGS: Lower chest: Cardiomegaly. Pericardial effusion similar in size to most recent exam with maximal thickness 2.6 cm. Minimal basilar atelectasis. Hepatobiliary: Enlarged liver spanning over 18.7 cm with prominent left lobe which can be seen with early cirrhosis. Poor enhancement without worrisome hepatic lesion. No calcified gallstones. Small amount of ascites and therefore cannot evaluate for gallbladder inflammatory process. Pancreas: Compressed by lesser sac fluid collection measuring 14 x 7.2 x 8.7 cm, without significant change. Spleen: No focal mass or enlargement. Adrenals/Urinary Tract: Atrophic native kidneys. 3.9 cm complex indeterminate left renal structure once again noted. Failed renal transplant left renal pelvis. Stomach/Bowel: Stomach compressed by lesser sac collection. Sigmoid diverticula. Appendix not visualized Evaluation of bowel limited slightly by under distension and third spacing of fluid but no primary bowel inflammatory process noted. Vascular/Lymphatic: Prominent atherosclerotic changes abdominal aorta without aneurysm. Prominent atherosclerotic changes iliac arteries. Bulge proximal left common iliac artery measuring 1.8 x 2.3 cm without change. Dilated left external iliac artery proximal to the failed transplant measures up to 1.5 cm and without change. No adenopathy. Reproductive: No worrisome abnormality. Other: No free intraperitoneal air. Musculoskeletal: Changes of renal osteodystrophy. IMPRESSION: Lesser sac fluid collection measuring 14 x 7.2  x 8.7 cm, without significant change. This compresses the stomach and pancreas. Atrophic native kidneys. 3.9 cm complex indeterminate left renal structure once again noted. Failed renal transplant left renal pelvis. Aortic Atherosclerosis (ICD10-I70.0). Bulge proximal left common iliac artery measuring 1.8 x 2.3 cm without change. Dilated left external iliac artery proximal to the failed transplant measures up to 1.5 cm and without change. Cardiomegaly. Pericardial effusion similar in size to most recent exam with maximal thickness 2.6 cm. Enlarged liver spanning over 18.7 cm with prominent left lobe which can be seen with early cirrhosis. Third spacing of fluid/minimal amount of ascites. Electronically Signed   By: SGenia DelM.D.   On: 09/22/2017 20:41    Results/Tests Pending at Time of Discharge: none  Discharge Medications:  Allergies as of 09/24/2017      Reactions   Butalbital-apap-caffeine Shortness Of Breath, Swelling, Other (See Comments)   Swelling in throat   Ferrlecit [na Ferric Gluc Cplx In Sucrose] Shortness Of Breath, Swelling, Other (See Comments)   Swelling in throat, tolerates Venofor   Minoxidil Shortness Of Breath   Tylenol [acetaminophen] Anaphylaxis, Swelling   Darvocet [propoxyphene N-acetaminophen] Hives      Medication List    STOP taking these medications   isosorbide mononitrate 60 MG 24 hr tablet Commonly known as:  IMDUR   oxyCODONE 5 MG immediate release tablet Commonly known as:  Oxy IR/ROXICODONE   warfarin 4 MG tablet Commonly known as:  COUMADIN   warfarin 7.5 MG tablet Commonly known as:  COUMADIN     TAKE these medications   amitriptyline 10 MG tablet Commonly known as:  ELAVIL Take 1 tablet (10 mg total) by mouth at bedtime.  amLODipine 10 MG tablet Commonly known as:  NORVASC Take 1 tablet (10 mg total) by mouth at bedtime.   ammonium lactate 12 % lotion Commonly known as:  LAC-HYDRIN Apply topically as needed for dry skin.    atorvastatin 10 MG tablet Commonly known as:  LIPITOR Take 1 tablet (10 mg total) by mouth daily at 6 PM.   carvedilol 6.25 MG tablet Commonly known as:  COREG Take 6.25 mg by mouth 2 (two) times daily with a meal. What changed:  Another medication with the same name was removed. Continue taking this medication, and follow the directions you see here.   cinacalcet 30 MG tablet Commonly known as:  SENSIPAR Take 3 tablets (90 mg total) by mouth daily with supper.   Darbepoetin Alfa 60 MCG/0.3ML Sosy injection Commonly known as:  ARANESP Inject 0.3 mLs (60 mcg total) into the vein every Wednesday with hemodialysis.   docusate sodium 100 MG capsule Commonly known as:  COLACE Take 1 capsule (100 mg total) by mouth 2 (two) times daily.   feeding supplement (PRO-STAT SUGAR FREE 64) Liqd Take 30 mLs by mouth 2 (two) times daily.   heparin 100-0.45 UNIT/ML-% infusion Inject 1,650 Units/hr into the vein continuous.   hydrALAZINE 100 MG tablet Commonly known as:  APRESOLINE Take 100 mg by mouth 2 (two) times daily.   metoCLOPramide 5 MG tablet Commonly known as:  REGLAN Take 1 tablet (5 mg total) by mouth every 8 (eight) hours as needed for nausea or vomiting.   multivitamin Tabs tablet Take 1 tablet by mouth at bedtime.   ondansetron 4 MG disintegrating tablet Commonly known as:  ZOFRAN-ODT Take 1 tablet (4 mg total) by mouth every 8 (eight) hours as needed for nausea. 31m ODT q4 hours prn nausea   pantoprazole 40 MG tablet Commonly known as:  PROTONIX Take 1 tablet (40 mg total) by mouth 2 (two) times daily before a meal.   polyethylene glycol packet Commonly known as:  MIRALAX / GLYCOLAX Take 17 g by mouth daily as needed for mild constipation.   sevelamer carbonate 800 MG tablet Commonly known as:  RENVELA Take 3 tablets (2,400 mg total) by mouth 3 (three) times daily with meals.       Discharge Instructions: Please refer to Patient Instructions section of EMR for  full details.  Patient was counseled important signs and symptoms that should prompt return to medical care, changes in medications, dietary instructions, activity restrictions, and follow up appointments.   Follow-Up Appointments: After discharge from DMosie Lukes MD 09/24/2017, 7:00 PM PGY-2, [place]

## 2017-09-24 NOTE — Progress Notes (Signed)
[facility] Progress Note  Subjective: on HD, no new c/o  Vitals:   09/24/17 0800 09/24/17 0830 09/24/17 0900 09/24/17 0930  BP: (!) 144/79 (!) 144/77 (!) 161/90 (!) 151/88  Pulse: 69 68 78 73  Resp:      Temp:      TempSrc:      SpO2:      Weight:      Height:        Inpatient medications: . amitriptyline  10 mg Oral QHS  . amLODipine  10 mg Oral QHS  . atorvastatin  10 mg Oral q1800  . carvedilol  6.25 mg Oral BID WC  . hydrALAZINE  100 mg Oral BID  . HYDROmorphone      . oseltamivir  30 mg Oral Q M,W,F-HD  . pantoprazole (PROTONIX) IV  40 mg Intravenous Daily  . patiromer  8.4 g Oral Daily  . sevelamer carbonate  2,400 mg Oral TID WC   . heparin 1,650 Units/hr (09/24/17 0545)   albuterol, HYDROmorphone (DILAUDID) injection, labetalol, ondansetron **OR** ondansetron (ZOFRAN) IV, phenol  Exam: Alert, coughing , no distress No jvd Chest coarse rhonchi bilat RRR no rg Abd soft ntnd Ext no leg edema LUA AVF+bruit NF, ox3   Dialysis: MWF  [facility] 4h  77.5kg  2/2.25 bath   LUA AVF  Heparin none -hect 8 ug -venofer 50 /wk      Impression: 1  Influenza PNA - tamiflu at renal dosing for 5 days total 2  ESRD HD MWF 3  HTN - cont home BP meds x 3 4  Panc pseudocyst/ phlegmon - f/b [facility] for this, per primary awaiting acceptance there 5  Hx DVT on coumadin 6  Non compliance 7  Volume - at dry wt 8  Hyperkalemia - chronic issue, long-term, will try Veltassa once daily, see if insurance will cover long term use  Plan - HD today   Kelly Splinter MD [facility] pager [phone]   09/24/2017, 10:30 AM   Recent Labs  Lab 09/21/17 0919 09/22/17 0500 09/24/17 0901  NA 138 136 133*  K 5.2* 5.2* 4.5  CL 96* 98* 94*  CO2 21* 20* 21*  GLUCOSE 110* 100* 90  BUN 68* 81* 58*  CREATININE 12.65* 15.14* 12.11*  CALCIUM 9.3 8.6* 8.6*  PHOS  --  7.8* 7.0*   Recent Labs  Lab 09/21/17 0919 09/22/17 0500 09/24/17 0901  AST 27  --   --    ALT 14*  --   --   ALKPHOS 91  --   --   BILITOT 1.2  --   --   PROT 8.0  --   --   ALBUMIN 3.3* 3.0* 3.0*   Recent Labs  Lab 09/21/17 0919 09/22/17 0500 09/23/17 0419 09/24/17 0507  WBC 6.0 5.5 4.3 3.0*  NEUTROABS 4.8  --   --   --   HGB 9.7* 9.2* 9.0* 9.7*  HCT 29.1* 27.7* 27.6* 29.1*  MCV 85.3 85.2 84.4 84.3  PLT 146* 138* 104* 116*   Iron/TIBC/Ferritin/ %Sat    Component Value Date/Time   IRON 41 (L) 05/29/2016 1350   TIBC 190 (L) 05/29/2016 1350   FERRITIN 328 11/02/2015 0346   IRONPCTSAT 22 05/29/2016 1350

## 2017-09-24 NOTE — Progress Notes (Signed)
[name]: [phone]  Patient name: Frank [name] Medical record number: [id number] Date of birth: March 16, 1966 Age: 52 y.o. Gender: male  Primary Care Provider: Patient, [name] Consultants: nephro Code Status: full  Pt Overview and Major Events to Date:  MONTREL DONAHOE is a 52 y.o. male presenting with abdominal pain with vomiting and diarrhea since Friday, also found to be influenza positive. PMH is significant for significant forESRDon MWF HD, HTN, HFrEF, pericardial effusion, NICM,DVT/PEon Coumadin, anxiety and depression, chronic pain syndrome, anemia of chronic disease, GERD, chronic pancreatitis, HLD, OSA, history of diabetes (last A1c 5.2 in October off medications).   Assessment and Plan: BRADFORD CAZIER is a 52 y.o. male presenting with abdominal pain with vomiting and diarrhea since Friday, also found to be influenza positive. PMH is significant for significant forESRDon MWF HD, HTN, HFrEF, pericardial effusion, NICM,DVT/PEon Coumadin, anxiety and depression, chronic pain syndrome, anemia of chronic disease, GERD, chronic pancreatitis, HLD, OSA, history of diabetes (last A1c 5.2 in October off medications).   Abdominal Pain: chronic pancreatitis.  Could also be complicated by exacerbation by flu.  He was admitted last month from 08/24/17-09/04/17 for abdominal pain and was subsequently transferred to [facility] for possible endoscopic drainage of an enlarging pancreatic pseudocyst, as he was not a candidate for percutaneous drainage. Patient reports this was unable to be done because walls of cyst not thick enough. Troponin 0.08 (last values 0.09 in January).  CT showed large pancreatic pseudo cyst. - Admit to med-surg under inpatient status, attending Dr. Nori Riis - [facility] transfer center has been contacted and a Dr. Bobby Rumpf (sp?) is expected to contact us regarding coordination of anticoagulation/NPO/transport if they will take the patient -  Dilaudid 1 mg q3hrs prn for pain - Zofranprnfor nausea - regular diet as tolerated - Vitals per routine  Hypoxia:satting mid 90s on RA now. He does not use oxygen at home. Chronic pulmonary venous congestion on CXR. positive flu.  Did have elevated LA to 2.21 but improved to 1.33 with fluids (IV abx). Inspiratory wheezes over left posterior lung fields on exam.  - Albuterol prn for wheezing - Provide supportive treatment for cough  ESRD on MWF DX:AJOINOM with [facility].   patient does not appear volume overloaded - Continue tamiflu 30 mg after every HD session for next 5 days (assuming 3 sessions) - home Renvela 2,43m tid  - Nephro consulted for dialysis MWF, appreciate recommendations  HTN: BP up to 1767Msystolic Likely elevated due to pain - restarted Norvasc 128mdaily, Coreg 25 mg bid - Labetalol IV 1051mrn for SBP > 160 or DBP > 100 - Add IV Hydralazine prn  HFrEF / NICM:ECHO 08/27/17 with EF of 20-25% G3DD and moderate pericardial effusion, significantly worse compared to ECHO on 10/31/15 with EF 35-40%. - restarted  Coreg 64m54md  -holding Imdur 60mg42mFluid status management with HD  Hx DVT/PE:Has had multiple DVTs in the past. On Coumadin at home. - Heparin per pharm - INR 1.56 in setting of missed doses - Contacted [facility] about holding coumadin after CT so they can direct anticoagulation  Anxiety/depression:Chronic, stable. No SI/HI. -restart Amitriptyline   Chronic Pain Syndrome: - Restart home Amitriptyline 10mg 30memia of Chronic Disease:Likely related to CKD. Hgb 10.5 on admission (baseline ~10) - Trend Hgb  OSA:StCNO:BSJGGEP qhs  GERD: On home Protonix 40mg b24m IV protonix  FEN/GI: regular diet as tolerated Prophylaxis: Heparin  per pharmacy (normally on coumadin but to have possible IR procedure 09/25/17).   Disposition: trying to speak w/ [facility] about transfer vs home in light of potential psuedocyst drainage at [facility]  3/7  Subjective:  Patient at HD during rounds  Objective: Temp:  [97.8 F (36.6 C)-98 F (36.7 C)] 98 F (36.7 C) (03/06 0433) Pulse Rate:  [63-78] 63 (03/06 0433) Resp:  [18-20] 20 (03/06 0433) BP: (128-144)/(78-97) 128/78 (03/06 0433) SpO2:  [92 %-94 %] 93 % (03/06 0433) Physical Exam: Patient at HD during rounds  Laboratory: Recent Labs  Lab 09/22/17 0500 09/23/17 0419 09/24/17 0507  WBC 5.5 4.3 3.0*  HGB 9.2* 9.0* 9.7*  HCT 27.7* 27.6* 29.1*  PLT 138* 104* 116*   Recent Labs  Lab 09/21/17 0919 09/22/17 0500  NA 138 136  K 5.2* 5.2*  CL 96* 98*  CO2 21* 20*  BUN 68* 81*  CREATININE 12.65* 15.14*  CALCIUM 9.3 8.6*  PROT 8.0  --   BILITOT 1.2  --   ALKPHOS 91  --   ALT 14*  --   AST 27  --   GLUCOSE 110* 100*      Imaging/Diagnostic Tests: Dg Chest 2 View  Result Date: 09/21/2017 CLINICAL DATA:  Weakness following dialysis treatment. EXAM: CHEST  2 VIEW COMPARISON:  08/24/2017; 07/21/2017; 06/13/2017; chest CT-06/24/2017 FINDINGS: Grossly unchanged enlarged cardiac silhouette and mediastinal contours. Chronic pulmonary venous congestion without frank evidence of edema. No new focal airspace opacities. No pleural effusion, though a small amount of fluid is seen tracking within the bilateral major and the right minor fissures. No pneumothorax. No acute osseus abnormalities. IMPRESSION: Similar findings cardiomegaly and pulmonary venous congestion without superimposed acute cardiopulmonary disease. Electronically Signed   By: Sandi Mariscal M.D.   On: 09/21/2017 09:05     Sherene Sires, DO 09/24/2017, 6:33 AM PGY-1, [place] Intern pager: [phone], text pages welcome

## 2017-09-25 DIAGNOSIS — I132 Hypertensive heart and chronic kidney disease with heart failure and with stage 5 chronic kidney disease, or end stage renal disease: Secondary | ICD-10-CM | POA: Diagnosis present

## 2017-09-25 DIAGNOSIS — Z86711 Personal history of pulmonary embolism: Secondary | ICD-10-CM | POA: Diagnosis not present

## 2017-09-25 DIAGNOSIS — J90 Pleural effusion, not elsewhere classified: Secondary | ICD-10-CM | POA: Diagnosis not present

## 2017-09-25 DIAGNOSIS — R918 Other nonspecific abnormal finding of lung field: Secondary | ICD-10-CM | POA: Diagnosis not present

## 2017-09-25 DIAGNOSIS — R160 Hepatomegaly, not elsewhere classified: Secondary | ICD-10-CM | POA: Diagnosis not present

## 2017-09-25 DIAGNOSIS — Z992 Dependence on renal dialysis: Secondary | ICD-10-CM | POA: Diagnosis not present

## 2017-09-25 DIAGNOSIS — Z4901 Encounter for fitting and adjustment of extracorporeal dialysis catheter: Secondary | ICD-10-CM | POA: Diagnosis not present

## 2017-09-25 DIAGNOSIS — J101 Influenza due to other identified influenza virus with other respiratory manifestations: Secondary | ICD-10-CM | POA: Diagnosis not present

## 2017-09-25 DIAGNOSIS — K659 Peritonitis, unspecified: Secondary | ICD-10-CM | POA: Diagnosis not present

## 2017-09-25 DIAGNOSIS — K668 Other specified disorders of peritoneum: Secondary | ICD-10-CM | POA: Diagnosis not present

## 2017-09-25 DIAGNOSIS — R791 Abnormal coagulation profile: Secondary | ICD-10-CM | POA: Diagnosis not present

## 2017-09-25 DIAGNOSIS — Z9889 Other specified postprocedural states: Secondary | ICD-10-CM | POA: Diagnosis not present

## 2017-09-25 DIAGNOSIS — K3189 Other diseases of stomach and duodenum: Secondary | ICD-10-CM | POA: Diagnosis not present

## 2017-09-25 DIAGNOSIS — G4733 Obstructive sleep apnea (adult) (pediatric): Secondary | ICD-10-CM | POA: Diagnosis not present

## 2017-09-25 DIAGNOSIS — K863 Pseudocyst of pancreas: Secondary | ICD-10-CM | POA: Diagnosis not present

## 2017-09-25 DIAGNOSIS — Z4659 Encounter for fitting and adjustment of other gastrointestinal appliance and device: Secondary | ICD-10-CM | POA: Diagnosis not present

## 2017-09-25 DIAGNOSIS — J9811 Atelectasis: Secondary | ICD-10-CM | POA: Diagnosis not present

## 2017-09-25 DIAGNOSIS — K651 Peritoneal abscess: Secondary | ICD-10-CM | POA: Diagnosis not present

## 2017-09-25 DIAGNOSIS — K8592 Acute pancreatitis with infected necrosis, unspecified: Secondary | ICD-10-CM | POA: Diagnosis not present

## 2017-09-25 DIAGNOSIS — Z792 Long term (current) use of antibiotics: Secondary | ICD-10-CM | POA: Diagnosis not present

## 2017-09-25 DIAGNOSIS — Z86718 Personal history of other venous thrombosis and embolism: Secondary | ICD-10-CM | POA: Diagnosis not present

## 2017-09-25 DIAGNOSIS — K8689 Other specified diseases of pancreas: Secondary | ICD-10-CM | POA: Diagnosis not present

## 2017-09-25 DIAGNOSIS — I5040 Unspecified combined systolic (congestive) and diastolic (congestive) heart failure: Secondary | ICD-10-CM | POA: Diagnosis not present

## 2017-09-25 DIAGNOSIS — E43 Unspecified severe protein-calorie malnutrition: Secondary | ICD-10-CM | POA: Diagnosis present

## 2017-09-25 DIAGNOSIS — I517 Cardiomegaly: Secondary | ICD-10-CM | POA: Diagnosis not present

## 2017-09-25 DIAGNOSIS — T8612 Kidney transplant failure: Secondary | ICD-10-CM | POA: Diagnosis present

## 2017-09-25 DIAGNOSIS — G894 Chronic pain syndrome: Secondary | ICD-10-CM | POA: Diagnosis present

## 2017-09-25 DIAGNOSIS — Z940 Kidney transplant status: Secondary | ICD-10-CM | POA: Diagnosis not present

## 2017-09-25 DIAGNOSIS — Z79899 Other long term (current) drug therapy: Secondary | ICD-10-CM | POA: Diagnosis not present

## 2017-09-25 DIAGNOSIS — J449 Chronic obstructive pulmonary disease, unspecified: Secondary | ICD-10-CM | POA: Diagnosis present

## 2017-09-25 DIAGNOSIS — K219 Gastro-esophageal reflux disease without esophagitis: Secondary | ICD-10-CM | POA: Diagnosis present

## 2017-09-25 DIAGNOSIS — N250 Renal osteodystrophy: Secondary | ICD-10-CM | POA: Diagnosis not present

## 2017-09-25 DIAGNOSIS — R109 Unspecified abdominal pain: Secondary | ICD-10-CM | POA: Diagnosis not present

## 2017-09-25 DIAGNOSIS — R935 Abnormal findings on diagnostic imaging of other abdominal regions, including retroperitoneum: Secondary | ICD-10-CM | POA: Diagnosis not present

## 2017-09-25 DIAGNOSIS — B9562 Methicillin resistant Staphylococcus aureus infection as the cause of diseases classified elsewhere: Secondary | ICD-10-CM | POA: Diagnosis not present

## 2017-09-25 DIAGNOSIS — I313 Pericardial effusion (noninflammatory): Secondary | ICD-10-CM | POA: Diagnosis not present

## 2017-09-25 DIAGNOSIS — K861 Other chronic pancreatitis: Secondary | ICD-10-CM | POA: Diagnosis not present

## 2017-09-25 DIAGNOSIS — N2581 Secondary hyperparathyroidism of renal origin: Secondary | ICD-10-CM | POA: Diagnosis not present

## 2017-09-25 DIAGNOSIS — I120 Hypertensive chronic kidney disease with stage 5 chronic kidney disease or end stage renal disease: Secondary | ICD-10-CM | POA: Diagnosis not present

## 2017-09-25 DIAGNOSIS — J111 Influenza due to unidentified influenza virus with other respiratory manifestations: Secondary | ICD-10-CM | POA: Diagnosis not present

## 2017-09-25 DIAGNOSIS — I10 Essential (primary) hypertension: Secondary | ICD-10-CM | POA: Diagnosis not present

## 2017-09-25 DIAGNOSIS — N186 End stage renal disease: Secondary | ICD-10-CM | POA: Diagnosis not present

## 2017-09-25 DIAGNOSIS — I5042 Chronic combined systolic (congestive) and diastolic (congestive) heart failure: Secondary | ICD-10-CM | POA: Diagnosis not present

## 2017-09-25 DIAGNOSIS — R079 Chest pain, unspecified: Secondary | ICD-10-CM | POA: Diagnosis not present

## 2017-09-25 DIAGNOSIS — T8610 Unspecified complication of kidney transplant: Secondary | ICD-10-CM | POA: Diagnosis not present

## 2017-09-25 DIAGNOSIS — Z9989 Dependence on other enabling machines and devices: Secondary | ICD-10-CM | POA: Diagnosis not present

## 2017-09-25 DIAGNOSIS — N2889 Other specified disorders of kidney and ureter: Secondary | ICD-10-CM | POA: Diagnosis not present

## 2017-09-25 DIAGNOSIS — T829XXA Unspecified complication of cardiac and vascular prosthetic device, implant and graft, initial encounter: Secondary | ICD-10-CM | POA: Diagnosis not present

## 2017-09-25 DIAGNOSIS — K862 Cyst of pancreas: Secondary | ICD-10-CM | POA: Diagnosis not present

## 2017-09-25 DIAGNOSIS — Z7901 Long term (current) use of anticoagulants: Secondary | ICD-10-CM | POA: Diagnosis not present

## 2017-09-25 DIAGNOSIS — I700 Atherosclerosis of aorta: Secondary | ICD-10-CM | POA: Diagnosis not present

## 2017-09-25 DIAGNOSIS — Z978 Presence of other specified devices: Secondary | ICD-10-CM | POA: Diagnosis not present

## 2017-09-25 DIAGNOSIS — I509 Heart failure, unspecified: Secondary | ICD-10-CM | POA: Diagnosis not present

## 2017-09-25 DIAGNOSIS — E1122 Type 2 diabetes mellitus with diabetic chronic kidney disease: Secondary | ICD-10-CM | POA: Diagnosis present

## 2017-09-25 DIAGNOSIS — D631 Anemia in chronic kidney disease: Secondary | ICD-10-CM | POA: Diagnosis not present

## 2017-09-25 DIAGNOSIS — R188 Other ascites: Secondary | ICD-10-CM | POA: Diagnosis not present

## 2017-09-25 HISTORY — PX: OTHER SURGICAL HISTORY: SHX169

## 2017-09-26 LAB — CULTURE, BLOOD (ROUTINE X 2)
Culture: NO GROWTH
Culture: NO GROWTH
SPECIAL REQUESTS: ADEQUATE
Special Requests: ADEQUATE

## 2017-10-15 DIAGNOSIS — K862 Cyst of pancreas: Secondary | ICD-10-CM | POA: Diagnosis not present

## 2017-10-20 DIAGNOSIS — N186 End stage renal disease: Secondary | ICD-10-CM | POA: Diagnosis not present

## 2017-10-20 DIAGNOSIS — T8610 Unspecified complication of kidney transplant: Secondary | ICD-10-CM | POA: Diagnosis not present

## 2017-10-20 DIAGNOSIS — Z992 Dependence on renal dialysis: Secondary | ICD-10-CM | POA: Diagnosis not present

## 2017-10-29 DIAGNOSIS — I10 Essential (primary) hypertension: Secondary | ICD-10-CM | POA: Diagnosis not present

## 2017-10-29 DIAGNOSIS — J811 Chronic pulmonary edema: Secondary | ICD-10-CM | POA: Diagnosis not present

## 2017-10-29 DIAGNOSIS — Z48813 Encounter for surgical aftercare following surgery on the respiratory system: Secondary | ICD-10-CM | POA: Diagnosis not present

## 2017-10-29 DIAGNOSIS — R918 Other nonspecific abnormal finding of lung field: Secondary | ICD-10-CM | POA: Diagnosis not present

## 2017-10-29 DIAGNOSIS — K861 Other chronic pancreatitis: Secondary | ICD-10-CM | POA: Diagnosis not present

## 2017-10-29 DIAGNOSIS — Z992 Dependence on renal dialysis: Secondary | ICD-10-CM | POA: Diagnosis not present

## 2017-10-29 DIAGNOSIS — J90 Pleural effusion, not elsewhere classified: Secondary | ICD-10-CM | POA: Diagnosis not present

## 2017-10-29 DIAGNOSIS — Z86718 Personal history of other venous thrombosis and embolism: Secondary | ICD-10-CM | POA: Diagnosis not present

## 2017-10-29 DIAGNOSIS — E1122 Type 2 diabetes mellitus with diabetic chronic kidney disease: Secondary | ICD-10-CM | POA: Diagnosis not present

## 2017-10-29 DIAGNOSIS — I313 Pericardial effusion (noninflammatory): Secondary | ICD-10-CM | POA: Diagnosis not present

## 2017-10-29 DIAGNOSIS — G4731 Primary central sleep apnea: Secondary | ICD-10-CM | POA: Diagnosis not present

## 2017-10-29 DIAGNOSIS — I120 Hypertensive chronic kidney disease with stage 5 chronic kidney disease or end stage renal disease: Secondary | ICD-10-CM | POA: Diagnosis not present

## 2017-10-29 DIAGNOSIS — I5040 Unspecified combined systolic (congestive) and diastolic (congestive) heart failure: Secondary | ICD-10-CM | POA: Diagnosis not present

## 2017-10-29 DIAGNOSIS — K863 Pseudocyst of pancreas: Secondary | ICD-10-CM | POA: Diagnosis not present

## 2017-10-29 DIAGNOSIS — I132 Hypertensive heart and chronic kidney disease with heart failure and with stage 5 chronic kidney disease, or end stage renal disease: Secondary | ICD-10-CM | POA: Diagnosis not present

## 2017-10-29 DIAGNOSIS — R791 Abnormal coagulation profile: Secondary | ICD-10-CM | POA: Diagnosis not present

## 2017-10-29 DIAGNOSIS — I5022 Chronic systolic (congestive) heart failure: Secondary | ICD-10-CM | POA: Diagnosis not present

## 2017-10-29 DIAGNOSIS — I517 Cardiomegaly: Secondary | ICD-10-CM | POA: Diagnosis not present

## 2017-10-29 DIAGNOSIS — J9811 Atelectasis: Secondary | ICD-10-CM | POA: Diagnosis not present

## 2017-10-29 DIAGNOSIS — R748 Abnormal levels of other serum enzymes: Secondary | ICD-10-CM | POA: Diagnosis not present

## 2017-10-29 DIAGNOSIS — N186 End stage renal disease: Secondary | ICD-10-CM | POA: Diagnosis not present

## 2017-10-29 DIAGNOSIS — R0789 Other chest pain: Secondary | ICD-10-CM | POA: Diagnosis not present

## 2017-10-29 DIAGNOSIS — D631 Anemia in chronic kidney disease: Secondary | ICD-10-CM | POA: Diagnosis not present

## 2017-10-29 DIAGNOSIS — R079 Chest pain, unspecified: Secondary | ICD-10-CM | POA: Diagnosis not present

## 2017-10-29 DIAGNOSIS — Z86711 Personal history of pulmonary embolism: Secondary | ICD-10-CM | POA: Diagnosis not present

## 2017-10-30 DIAGNOSIS — Z992 Dependence on renal dialysis: Secondary | ICD-10-CM | POA: Diagnosis not present

## 2017-10-30 DIAGNOSIS — K5900 Constipation, unspecified: Secondary | ICD-10-CM | POA: Diagnosis not present

## 2017-10-30 DIAGNOSIS — R140 Abdominal distension (gaseous): Secondary | ICD-10-CM | POA: Diagnosis not present

## 2017-10-30 DIAGNOSIS — Z7901 Long term (current) use of anticoagulants: Secondary | ICD-10-CM | POA: Diagnosis not present

## 2017-10-30 DIAGNOSIS — K219 Gastro-esophageal reflux disease without esophagitis: Secondary | ICD-10-CM | POA: Diagnosis present

## 2017-10-30 DIAGNOSIS — Z888 Allergy status to other drugs, medicaments and biological substances status: Secondary | ICD-10-CM | POA: Diagnosis not present

## 2017-10-30 DIAGNOSIS — Z87898 Personal history of other specified conditions: Secondary | ICD-10-CM | POA: Diagnosis not present

## 2017-10-30 DIAGNOSIS — I132 Hypertensive heart and chronic kidney disease with heart failure and with stage 5 chronic kidney disease, or end stage renal disease: Secondary | ICD-10-CM | POA: Diagnosis present

## 2017-10-30 DIAGNOSIS — G4739 Other sleep apnea: Secondary | ICD-10-CM | POA: Diagnosis present

## 2017-10-30 DIAGNOSIS — I10 Essential (primary) hypertension: Secondary | ICD-10-CM | POA: Diagnosis not present

## 2017-10-30 DIAGNOSIS — Z765 Malingerer [conscious simulation]: Secondary | ICD-10-CM | POA: Diagnosis not present

## 2017-10-30 DIAGNOSIS — E1122 Type 2 diabetes mellitus with diabetic chronic kidney disease: Secondary | ICD-10-CM | POA: Diagnosis present

## 2017-10-30 DIAGNOSIS — J90 Pleural effusion, not elsewhere classified: Secondary | ICD-10-CM | POA: Diagnosis not present

## 2017-10-30 DIAGNOSIS — Z79899 Other long term (current) drug therapy: Secondary | ICD-10-CM | POA: Diagnosis not present

## 2017-10-30 DIAGNOSIS — K863 Pseudocyst of pancreas: Secondary | ICD-10-CM | POA: Diagnosis not present

## 2017-10-30 DIAGNOSIS — K861 Other chronic pancreatitis: Secondary | ICD-10-CM | POA: Diagnosis present

## 2017-10-30 DIAGNOSIS — N2889 Other specified disorders of kidney and ureter: Secondary | ICD-10-CM | POA: Diagnosis not present

## 2017-10-30 DIAGNOSIS — Z79891 Long term (current) use of opiate analgesic: Secondary | ICD-10-CM | POA: Diagnosis not present

## 2017-10-30 DIAGNOSIS — Z86711 Personal history of pulmonary embolism: Secondary | ICD-10-CM | POA: Diagnosis not present

## 2017-10-30 DIAGNOSIS — D631 Anemia in chronic kidney disease: Secondary | ICD-10-CM | POA: Diagnosis present

## 2017-10-30 DIAGNOSIS — R079 Chest pain, unspecified: Secondary | ICD-10-CM | POA: Diagnosis not present

## 2017-10-30 DIAGNOSIS — I5022 Chronic systolic (congestive) heart failure: Secondary | ICD-10-CM | POA: Diagnosis present

## 2017-10-30 DIAGNOSIS — Z86718 Personal history of other venous thrombosis and embolism: Secondary | ICD-10-CM | POA: Diagnosis not present

## 2017-10-30 DIAGNOSIS — N186 End stage renal disease: Secondary | ICD-10-CM | POA: Diagnosis present

## 2017-11-01 IMAGING — US US ABDOMEN LIMITED
1 series · 14 of 25 positions shown · non-contrast
Comparison: CT of the abdomen and pelvis performed 01/05/2017, and
right upper quadrant ultrasound performed 10/04/2016

CLINICAL DATA: Acute onset of right upper quadrant abdominal pain,
radiating to the back. Nausea and vomiting. Initial encounter.

EXAM:
ULTRASOUND ABDOMEN LIMITED RIGHT UPPER QUADRANT

[Series 1: us abdomen limited · 0.25mm/px · 14 of 48 slices shown]
[im 1/48]
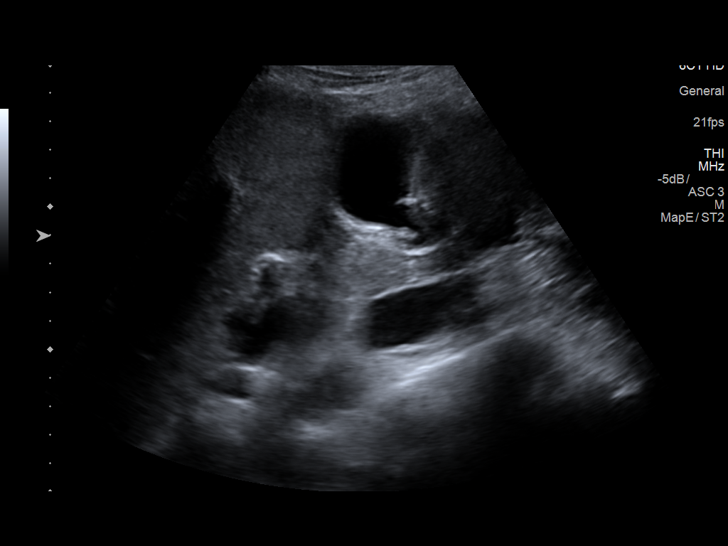
[im 4/48]
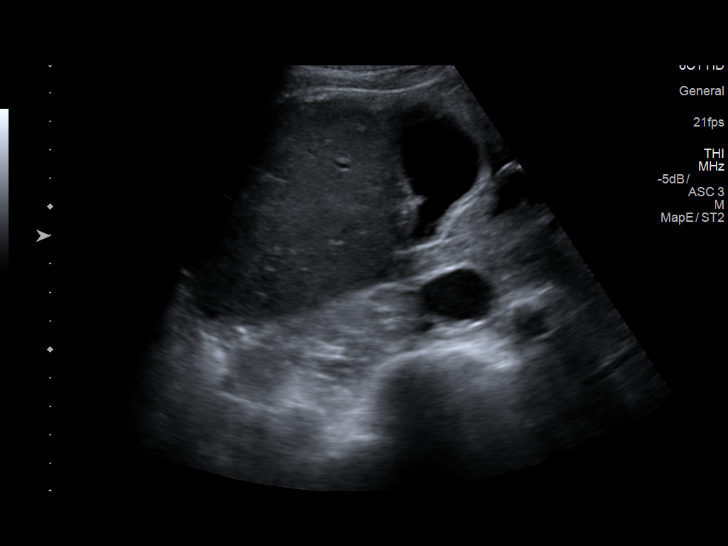
[im 8/48]
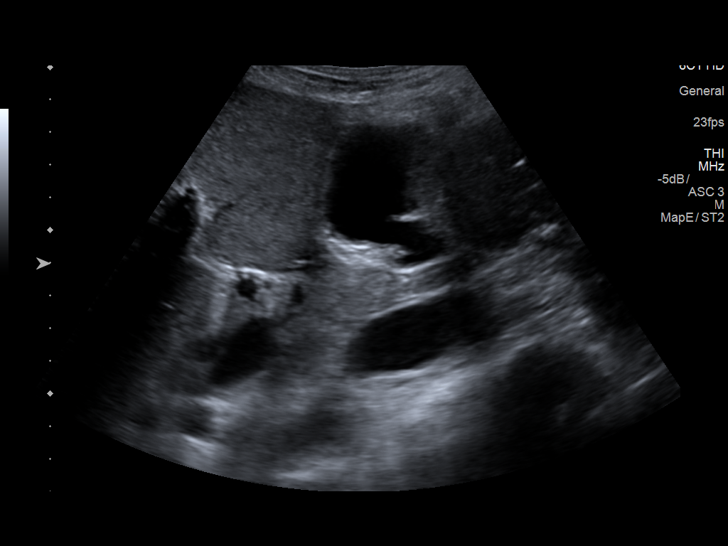
[im 12/48]
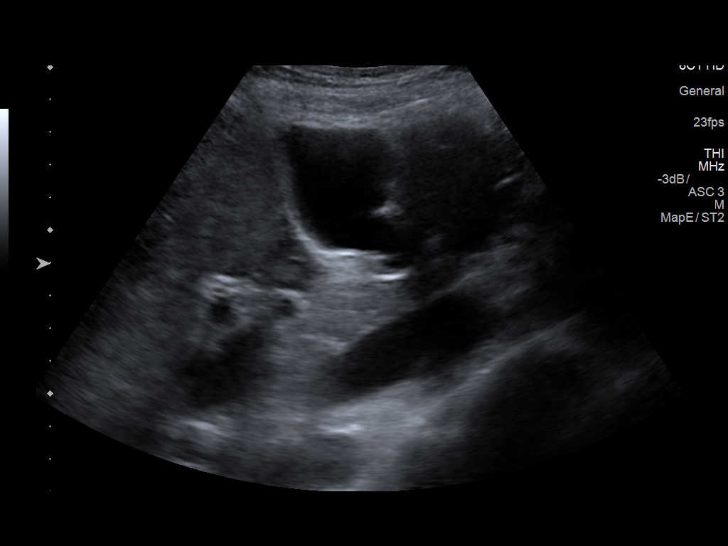
[im 16/48]
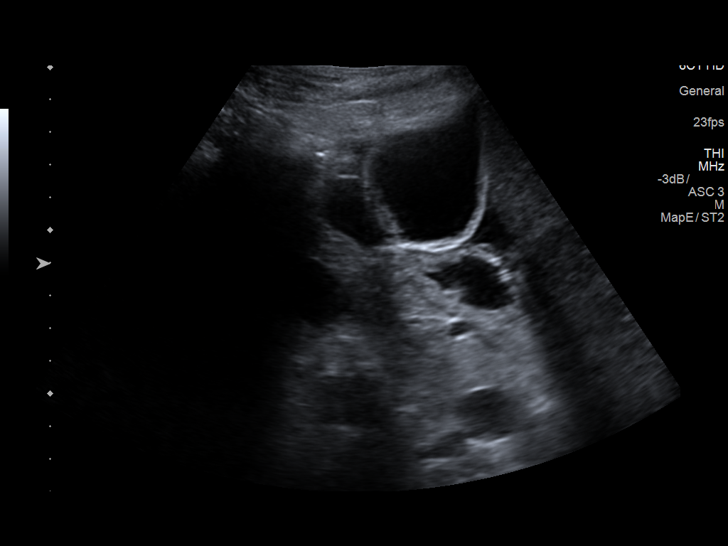
[im 18/48]
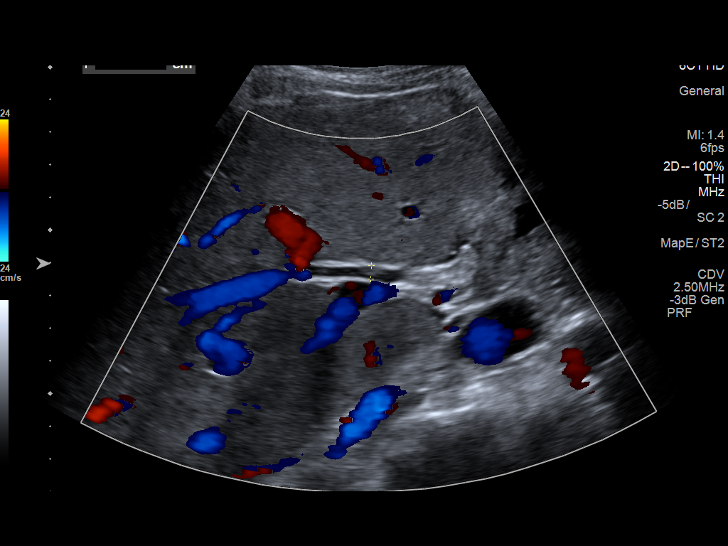
[im 22/48]
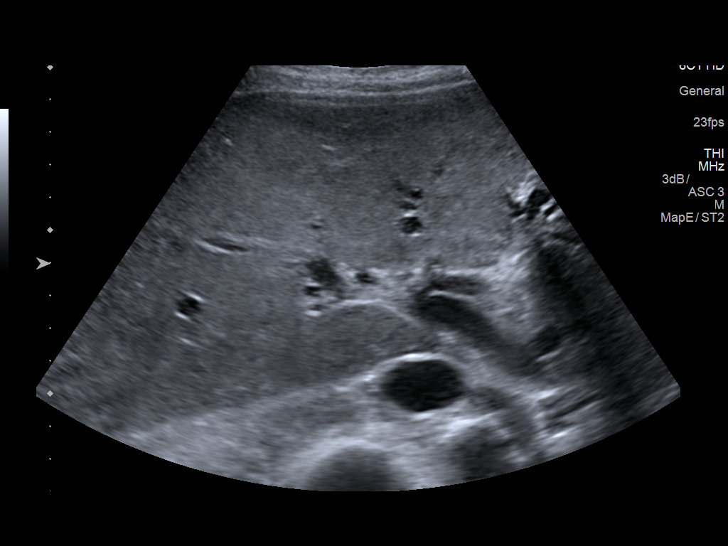
[im 26/48]
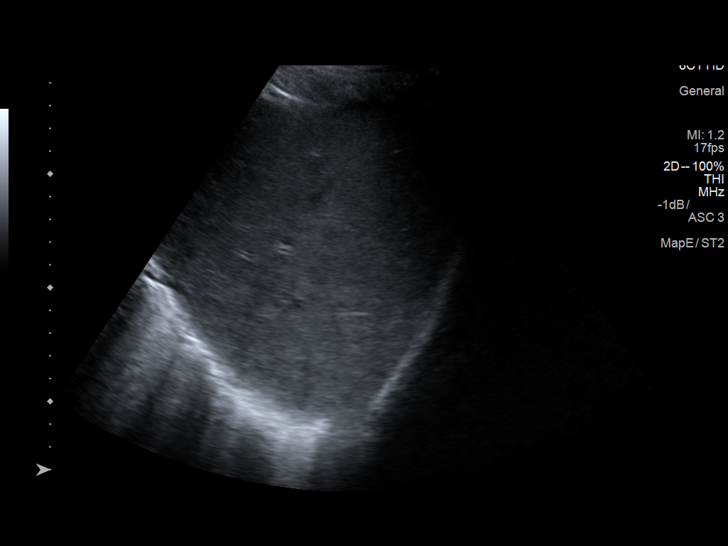
[im 30/48]
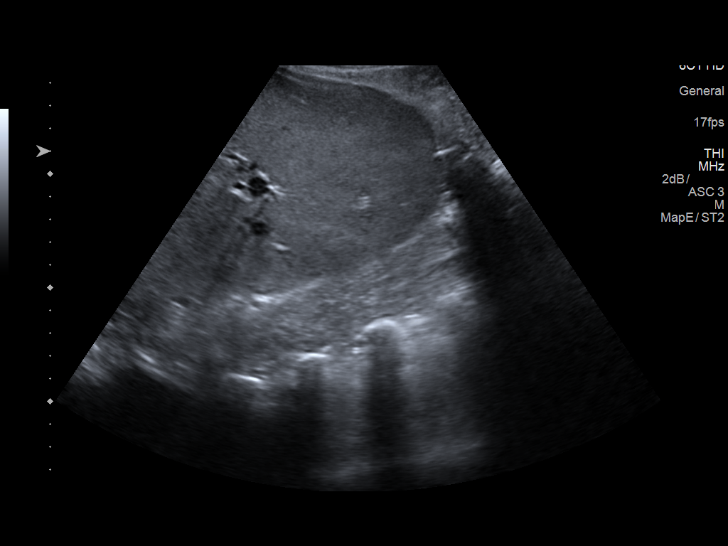
[im 32/48]
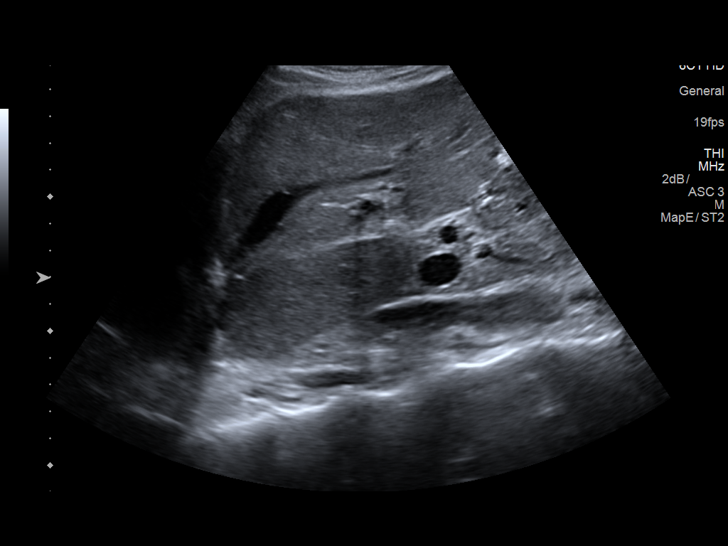
[im 36/48]
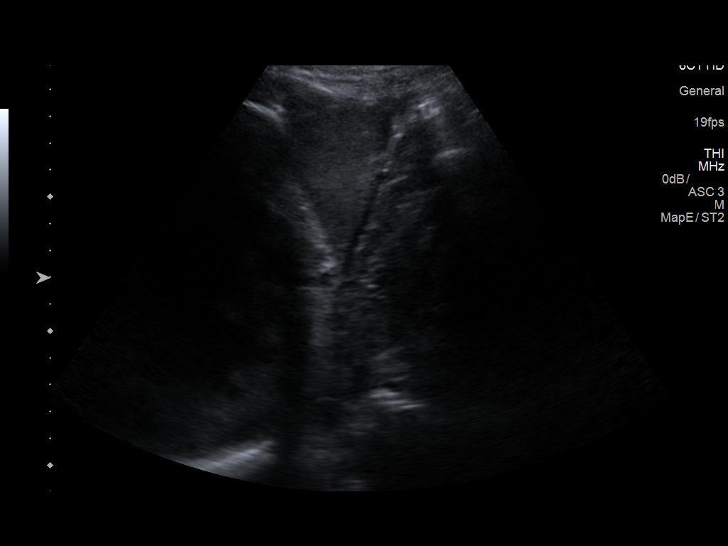
[im 40/48]
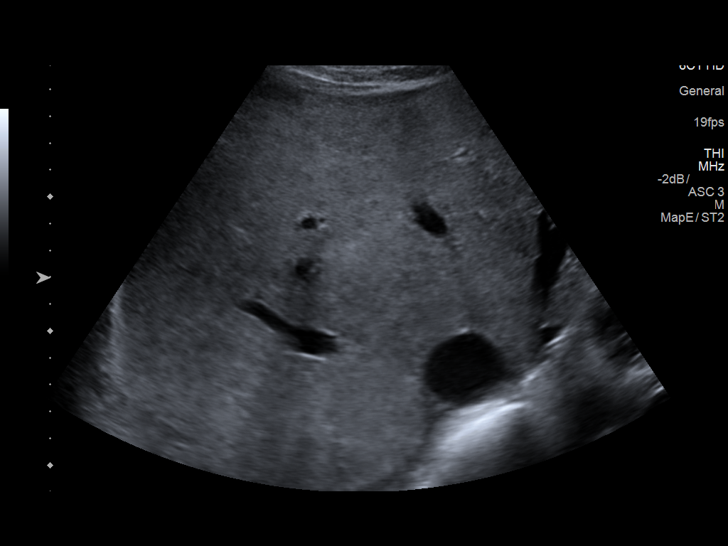
[im 44/48]
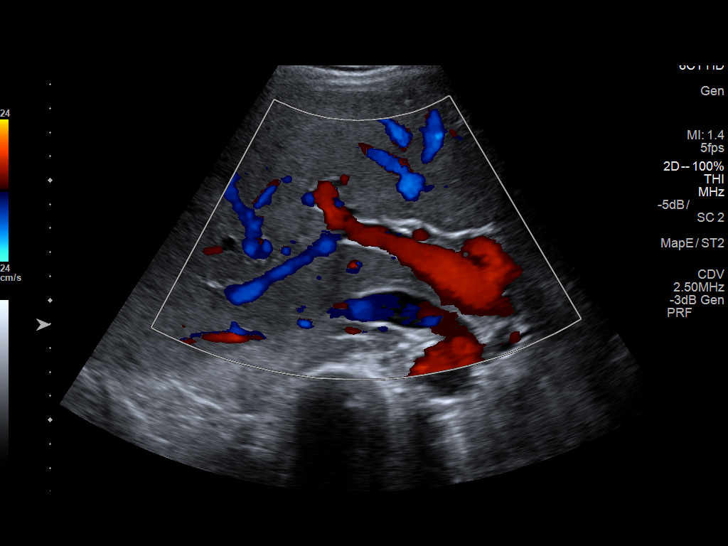
[im 48/48]
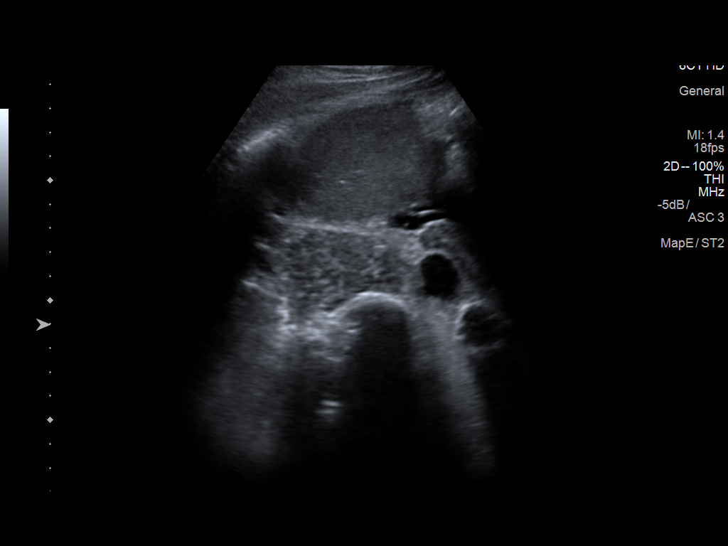

[14 of 25 positions shown; findings below may reference images not displayed]

FINDINGS: Gallbladder:

Gallbladder wall thickening is noted. Pericholecystic fluid is
nonspecific in the presence of trace ascites. No definite stones are
seen. Evaluation for ultrasonographic Murphy's sign is limited as
the patient is on pain medication.

Common bile duct:

Diameter: 0.6 cm, within normal limits in caliber.

Liver:

No focal lesion identified. Mildly coarsened echotexture noted.
Parenchymal echogenicity remains within normal limits.

A small amount of fluid is seen tracking about the liver.
IMPRESSION: 1. Gallbladder wall thickening noted. Pericholecystic fluid is
nonspecific in the presence of trace ascites. This could reflect
mild acute cholecystitis or chronic inflammation. No stones seen.
2. Trace ascites noted tracking about the liver.

## 2017-11-05 DIAGNOSIS — N186 End stage renal disease: Secondary | ICD-10-CM | POA: Diagnosis not present

## 2017-11-05 DIAGNOSIS — Z992 Dependence on renal dialysis: Secondary | ICD-10-CM | POA: Diagnosis not present

## 2017-11-05 DIAGNOSIS — B9562 Methicillin resistant Staphylococcus aureus infection as the cause of diseases classified elsewhere: Secondary | ICD-10-CM | POA: Diagnosis not present

## 2017-11-05 DIAGNOSIS — D631 Anemia in chronic kidney disease: Secondary | ICD-10-CM | POA: Diagnosis not present

## 2017-11-05 DIAGNOSIS — Z5181 Encounter for therapeutic drug level monitoring: Secondary | ICD-10-CM | POA: Diagnosis not present

## 2017-11-05 DIAGNOSIS — N2581 Secondary hyperparathyroidism of renal origin: Secondary | ICD-10-CM | POA: Diagnosis not present

## 2017-11-07 DIAGNOSIS — Z992 Dependence on renal dialysis: Secondary | ICD-10-CM | POA: Diagnosis not present

## 2017-11-07 DIAGNOSIS — D631 Anemia in chronic kidney disease: Secondary | ICD-10-CM | POA: Diagnosis not present

## 2017-11-07 DIAGNOSIS — N186 End stage renal disease: Secondary | ICD-10-CM | POA: Diagnosis not present

## 2017-11-07 DIAGNOSIS — B9562 Methicillin resistant Staphylococcus aureus infection as the cause of diseases classified elsewhere: Secondary | ICD-10-CM | POA: Diagnosis not present

## 2017-11-07 DIAGNOSIS — N2581 Secondary hyperparathyroidism of renal origin: Secondary | ICD-10-CM | POA: Diagnosis not present

## 2017-11-08 ENCOUNTER — Emergency Department (HOSPITAL_COMMUNITY): Payer: Medicare Other

## 2017-11-08 ENCOUNTER — Inpatient Hospital Stay (HOSPITAL_COMMUNITY)
Admission: EM | Admit: 2017-11-08 | Discharge: 2017-11-11 | DRG: 291 | Disposition: A | Discharge status: Home / Self Care | Payer: Medicare Other | Attending: Internal Medicine | Admitting: Internal Medicine

## 2017-11-08 ENCOUNTER — Encounter (HOSPITAL_COMMUNITY): Payer: Self-pay

## 2017-11-08 DIAGNOSIS — I120 Hypertensive chronic kidney disease with stage 5 chronic kidney disease or end stage renal disease: Secondary | ICD-10-CM | POA: Diagnosis not present

## 2017-11-08 DIAGNOSIS — K861 Other chronic pancreatitis: Secondary | ICD-10-CM | POA: Diagnosis present

## 2017-11-08 DIAGNOSIS — J9811 Atelectasis: Secondary | ICD-10-CM | POA: Diagnosis present

## 2017-11-08 DIAGNOSIS — J432 Centrilobular emphysema: Secondary | ICD-10-CM | POA: Diagnosis present

## 2017-11-08 DIAGNOSIS — K7460 Unspecified cirrhosis of liver: Secondary | ICD-10-CM | POA: Diagnosis present

## 2017-11-08 DIAGNOSIS — J189 Pneumonia, unspecified organism: Secondary | ICD-10-CM

## 2017-11-08 DIAGNOSIS — R0602 Shortness of breath: Secondary | ICD-10-CM | POA: Diagnosis not present

## 2017-11-08 DIAGNOSIS — I313 Pericardial effusion (noninflammatory): Secondary | ICD-10-CM | POA: Diagnosis present

## 2017-11-08 DIAGNOSIS — Z888 Allergy status to other drugs, medicaments and biological substances status: Secondary | ICD-10-CM

## 2017-11-08 DIAGNOSIS — R079 Chest pain, unspecified: Secondary | ICD-10-CM | POA: Diagnosis not present

## 2017-11-08 DIAGNOSIS — E875 Hyperkalemia: Secondary | ICD-10-CM | POA: Diagnosis present

## 2017-11-08 DIAGNOSIS — D631 Anemia in chronic kidney disease: Secondary | ICD-10-CM | POA: Diagnosis not present

## 2017-11-08 DIAGNOSIS — D61818 Other pancytopenia: Secondary | ICD-10-CM

## 2017-11-08 DIAGNOSIS — Z452 Encounter for adjustment and management of vascular access device: Secondary | ICD-10-CM

## 2017-11-08 DIAGNOSIS — R0789 Other chest pain: Secondary | ICD-10-CM | POA: Diagnosis not present

## 2017-11-08 DIAGNOSIS — I2781 Cor pulmonale (chronic): Secondary | ICD-10-CM | POA: Diagnosis present

## 2017-11-08 DIAGNOSIS — N186 End stage renal disease: Secondary | ICD-10-CM | POA: Diagnosis present

## 2017-11-08 DIAGNOSIS — K219 Gastro-esophageal reflux disease without esophagitis: Secondary | ICD-10-CM | POA: Diagnosis present

## 2017-11-08 DIAGNOSIS — I5043 Acute on chronic combined systolic (congestive) and diastolic (congestive) heart failure: Secondary | ICD-10-CM | POA: Diagnosis present

## 2017-11-08 DIAGNOSIS — J90 Pleural effusion, not elsewhere classified: Secondary | ICD-10-CM

## 2017-11-08 DIAGNOSIS — I429 Cardiomyopathy, unspecified: Secondary | ICD-10-CM | POA: Diagnosis present

## 2017-11-08 DIAGNOSIS — R05 Cough: Secondary | ICD-10-CM | POA: Diagnosis not present

## 2017-11-08 DIAGNOSIS — R0781 Pleurodynia: Secondary | ICD-10-CM

## 2017-11-08 DIAGNOSIS — R001 Bradycardia, unspecified: Secondary | ICD-10-CM | POA: Diagnosis present

## 2017-11-08 DIAGNOSIS — Z9119 Patient's noncompliance with other medical treatment and regimen: Secondary | ICD-10-CM

## 2017-11-08 DIAGNOSIS — I447 Left bundle-branch block, unspecified: Secondary | ICD-10-CM | POA: Diagnosis present

## 2017-11-08 DIAGNOSIS — I132 Hypertensive heart and chronic kidney disease with heart failure and with stage 5 chronic kidney disease, or end stage renal disease: Principal | ICD-10-CM | POA: Diagnosis present

## 2017-11-08 DIAGNOSIS — Z87891 Personal history of nicotine dependence: Secondary | ICD-10-CM

## 2017-11-08 DIAGNOSIS — K863 Pseudocyst of pancreas: Secondary | ICD-10-CM | POA: Diagnosis present

## 2017-11-08 DIAGNOSIS — N2581 Secondary hyperparathyroidism of renal origin: Secondary | ICD-10-CM | POA: Diagnosis present

## 2017-11-08 DIAGNOSIS — Y830 Surgical operation with transplant of whole organ as the cause of abnormal reaction of the patient, or of later complication, without mention of misadventure at the time of the procedure: Secondary | ICD-10-CM | POA: Diagnosis present

## 2017-11-08 DIAGNOSIS — Z8249 Family history of ischemic heart disease and other diseases of the circulatory system: Secondary | ICD-10-CM

## 2017-11-08 DIAGNOSIS — J690 Pneumonitis due to inhalation of food and vomit: Secondary | ICD-10-CM | POA: Diagnosis not present

## 2017-11-08 DIAGNOSIS — Z86718 Personal history of other venous thrombosis and embolism: Secondary | ICD-10-CM

## 2017-11-08 DIAGNOSIS — E1122 Type 2 diabetes mellitus with diabetic chronic kidney disease: Secondary | ICD-10-CM | POA: Diagnosis present

## 2017-11-08 DIAGNOSIS — Z86711 Personal history of pulmonary embolism: Secondary | ICD-10-CM

## 2017-11-08 DIAGNOSIS — I071 Rheumatic tricuspid insufficiency: Secondary | ICD-10-CM | POA: Diagnosis present

## 2017-11-08 DIAGNOSIS — T447X5A Adverse effect of beta-adrenoreceptor antagonists, initial encounter: Secondary | ICD-10-CM | POA: Diagnosis present

## 2017-11-08 DIAGNOSIS — Z885 Allergy status to narcotic agent status: Secondary | ICD-10-CM

## 2017-11-08 DIAGNOSIS — D649 Anemia, unspecified: Secondary | ICD-10-CM

## 2017-11-08 DIAGNOSIS — G8929 Other chronic pain: Secondary | ICD-10-CM | POA: Diagnosis present

## 2017-11-08 DIAGNOSIS — Z992 Dependence on renal dialysis: Secondary | ICD-10-CM

## 2017-11-08 NOTE — ED Triage Notes (Signed)
Pt states that he began to have central CP today with SOB, n/v along with upper abd pain.

## 2017-11-09 ENCOUNTER — Emergency Department (HOSPITAL_COMMUNITY): Payer: Medicare Other

## 2017-11-09 ENCOUNTER — Inpatient Hospital Stay (HOSPITAL_COMMUNITY): Payer: Medicare Other

## 2017-11-09 DIAGNOSIS — Z8719 Personal history of other diseases of the digestive system: Secondary | ICD-10-CM | POA: Diagnosis not present

## 2017-11-09 DIAGNOSIS — D649 Anemia, unspecified: Secondary | ICD-10-CM | POA: Diagnosis not present

## 2017-11-09 DIAGNOSIS — N186 End stage renal disease: Secondary | ICD-10-CM | POA: Diagnosis not present

## 2017-11-09 DIAGNOSIS — J690 Pneumonitis due to inhalation of food and vomit: Secondary | ICD-10-CM | POA: Diagnosis not present

## 2017-11-09 DIAGNOSIS — R0781 Pleurodynia: Secondary | ICD-10-CM

## 2017-11-09 DIAGNOSIS — Z95828 Presence of other vascular implants and grafts: Secondary | ICD-10-CM | POA: Diagnosis not present

## 2017-11-09 DIAGNOSIS — N2889 Other specified disorders of kidney and ureter: Secondary | ICD-10-CM | POA: Diagnosis not present

## 2017-11-09 DIAGNOSIS — R0789 Other chest pain: Secondary | ICD-10-CM | POA: Diagnosis not present

## 2017-11-09 DIAGNOSIS — Z86718 Personal history of other venous thrombosis and embolism: Secondary | ICD-10-CM | POA: Diagnosis not present

## 2017-11-09 DIAGNOSIS — I361 Nonrheumatic tricuspid (valve) insufficiency: Secondary | ICD-10-CM | POA: Diagnosis not present

## 2017-11-09 DIAGNOSIS — Z87891 Personal history of nicotine dependence: Secondary | ICD-10-CM | POA: Diagnosis not present

## 2017-11-09 DIAGNOSIS — Z452 Encounter for adjustment and management of vascular access device: Secondary | ICD-10-CM | POA: Diagnosis not present

## 2017-11-09 DIAGNOSIS — R05 Cough: Secondary | ICD-10-CM | POA: Diagnosis not present

## 2017-11-09 DIAGNOSIS — T447X5A Adverse effect of beta-adrenoreceptor antagonists, initial encounter: Secondary | ICD-10-CM | POA: Diagnosis present

## 2017-11-09 DIAGNOSIS — G8929 Other chronic pain: Secondary | ICD-10-CM | POA: Diagnosis present

## 2017-11-09 DIAGNOSIS — I313 Pericardial effusion (noninflammatory): Secondary | ICD-10-CM | POA: Diagnosis not present

## 2017-11-09 DIAGNOSIS — Z8249 Family history of ischemic heart disease and other diseases of the circulatory system: Secondary | ICD-10-CM | POA: Diagnosis not present

## 2017-11-09 DIAGNOSIS — I429 Cardiomyopathy, unspecified: Secondary | ICD-10-CM | POA: Diagnosis present

## 2017-11-09 DIAGNOSIS — J90 Pleural effusion, not elsewhere classified: Secondary | ICD-10-CM | POA: Diagnosis not present

## 2017-11-09 DIAGNOSIS — I132 Hypertensive heart and chronic kidney disease with heart failure and with stage 5 chronic kidney disease, or end stage renal disease: Secondary | ICD-10-CM | POA: Diagnosis not present

## 2017-11-09 DIAGNOSIS — I5042 Chronic combined systolic (congestive) and diastolic (congestive) heart failure: Secondary | ICD-10-CM | POA: Diagnosis not present

## 2017-11-09 DIAGNOSIS — Z86711 Personal history of pulmonary embolism: Secondary | ICD-10-CM | POA: Diagnosis not present

## 2017-11-09 DIAGNOSIS — K219 Gastro-esophageal reflux disease without esophagitis: Secondary | ICD-10-CM | POA: Diagnosis not present

## 2017-11-09 DIAGNOSIS — I2781 Cor pulmonale (chronic): Secondary | ICD-10-CM | POA: Diagnosis present

## 2017-11-09 DIAGNOSIS — D631 Anemia in chronic kidney disease: Secondary | ICD-10-CM | POA: Diagnosis not present

## 2017-11-09 DIAGNOSIS — K861 Other chronic pancreatitis: Secondary | ICD-10-CM | POA: Diagnosis not present

## 2017-11-09 DIAGNOSIS — Y830 Surgical operation with transplant of whole organ as the cause of abnormal reaction of the patient, or of later complication, without mention of misadventure at the time of the procedure: Secondary | ICD-10-CM | POA: Diagnosis present

## 2017-11-09 DIAGNOSIS — J9811 Atelectasis: Secondary | ICD-10-CM | POA: Diagnosis not present

## 2017-11-09 DIAGNOSIS — K863 Pseudocyst of pancreas: Secondary | ICD-10-CM | POA: Diagnosis not present

## 2017-11-09 DIAGNOSIS — I071 Rheumatic tricuspid insufficiency: Secondary | ICD-10-CM | POA: Diagnosis present

## 2017-11-09 DIAGNOSIS — R001 Bradycardia, unspecified: Secondary | ICD-10-CM | POA: Diagnosis not present

## 2017-11-09 DIAGNOSIS — E1122 Type 2 diabetes mellitus with diabetic chronic kidney disease: Secondary | ICD-10-CM | POA: Diagnosis not present

## 2017-11-09 DIAGNOSIS — K7460 Unspecified cirrhosis of liver: Secondary | ICD-10-CM | POA: Diagnosis present

## 2017-11-09 DIAGNOSIS — E875 Hyperkalemia: Secondary | ICD-10-CM | POA: Diagnosis not present

## 2017-11-09 DIAGNOSIS — I5043 Acute on chronic combined systolic (congestive) and diastolic (congestive) heart failure: Secondary | ICD-10-CM | POA: Diagnosis not present

## 2017-11-09 DIAGNOSIS — Z992 Dependence on renal dialysis: Secondary | ICD-10-CM | POA: Diagnosis not present

## 2017-11-09 DIAGNOSIS — I447 Left bundle-branch block, unspecified: Secondary | ICD-10-CM | POA: Diagnosis present

## 2017-11-09 DIAGNOSIS — N2581 Secondary hyperparathyroidism of renal origin: Secondary | ICD-10-CM | POA: Diagnosis not present

## 2017-11-09 HISTORY — DX: Pleurodynia: R07.81

## 2017-11-09 LAB — CBC
HEMATOCRIT: 24 % — AB (ref 39.0–52.0)
HEMOGLOBIN: 7.5 g/dL — AB (ref 13.0–17.0)
MCH: 25.8 pg — ABNORMAL LOW (ref 26.0–34.0)
MCHC: 31.3 g/dL (ref 30.0–36.0)
MCV: 82.5 fL (ref 78.0–100.0)
Platelets: 233 10*3/uL (ref 150–400)
RBC: 2.91 MIL/uL — AB (ref 4.22–5.81)
RDW: 17.9 % — ABNORMAL HIGH (ref 11.5–15.5)
WBC: 5.4 10*3/uL (ref 4.0–10.5)

## 2017-11-09 LAB — BASIC METABOLIC PANEL
ANION GAP: 14 (ref 5–15)
BUN: 40 mg/dL — ABNORMAL HIGH (ref 6–20)
CHLORIDE: 98 mmol/L — AB (ref 101–111)
CO2: 26 mmol/L (ref 22–32)
Calcium: 9 mg/dL (ref 8.9–10.3)
Creatinine, Ser: 7.36 mg/dL — ABNORMAL HIGH (ref 0.61–1.24)
GFR calc Af Amer: 9 mL/min — ABNORMAL LOW (ref >60)
GFR, EST NON AFRICAN AMERICAN: 8 mL/min — AB (ref >60)
Glucose, Bld: 103 mg/dL — ABNORMAL HIGH (ref 65–99)
POTASSIUM: 4.8 mmol/L (ref 3.5–5.1)
SODIUM: 138 mmol/L (ref 135–145)

## 2017-11-09 LAB — I-STAT TROPONIN, ED: Troponin i, poc: 0.02 ng/mL (ref 0.00–0.08)

## 2017-11-09 MED ORDER — VANCOMYCIN HCL 10 G IV SOLR
1500.0000 mg | Freq: Once | INTRAVENOUS | Status: AC
  Administered 2017-11-09: 1500 mg via INTRAVENOUS
  Filled 2017-11-09: qty 1500

## 2017-11-09 MED ORDER — SODIUM CHLORIDE 0.9 % IV SOLN
1.0000 g | Freq: Once | INTRAVENOUS | Status: AC
  Administered 2017-11-09: 1 g via INTRAVENOUS
  Filled 2017-11-09: qty 1

## 2017-11-09 MED ORDER — LISINOPRIL 20 MG PO TABS
40.0000 mg | ORAL_TABLET | Freq: Every day | ORAL | Status: DC
  Administered 2017-11-10: 40 mg via ORAL
  Filled 2017-11-09 (×2): qty 2

## 2017-11-09 MED ORDER — HYDROMORPHONE HCL 1 MG/ML IJ SOLN
1.0000 mg | INTRAMUSCULAR | Status: DC | PRN
  Administered 2017-11-09 – 2017-11-10 (×4): 1 mg via INTRAVENOUS
  Filled 2017-11-09 (×4): qty 1

## 2017-11-09 MED ORDER — HYDROMORPHONE HCL 2 MG/ML IJ SOLN: 1.0000 mg | INTRAMUSCULAR | Status: DC | PRN

## 2017-11-09 MED ORDER — APIXABAN 5 MG PO TABS
10.0000 mg | ORAL_TABLET | Freq: Two times a day (BID) | ORAL | Status: DC
  Administered 2017-11-09: 10 mg via ORAL

## 2017-11-09 MED ORDER — KETOROLAC TROMETHAMINE 60 MG/2ML IM SOLN
60.0000 mg | Freq: Once | INTRAMUSCULAR | Status: DC
  Filled 2017-11-09: qty 2

## 2017-11-09 MED ORDER — HYDROMORPHONE HCL 2 MG PO TABS
2.0000 mg | ORAL_TABLET | ORAL | Status: DC | PRN
  Administered 2017-11-09: 2 mg via ORAL
  Filled 2017-11-09: qty 1

## 2017-11-09 MED ORDER — HEPARIN SODIUM (PORCINE) 5000 UNIT/ML IJ SOLN: 5000.0000 [IU] | Freq: Three times a day (TID) | INTRAMUSCULAR | Status: DC

## 2017-11-09 MED ORDER — FENTANYL CITRATE (PF) 100 MCG/2ML IJ SOLN
25.0000 ug | INTRAMUSCULAR | Status: AC | PRN
  Administered 2017-11-09 (×2): 25 ug via INTRAVENOUS
  Filled 2017-11-09 (×2): qty 2

## 2017-11-09 MED ORDER — SODIUM CHLORIDE 0.9 % IV SOLN
1.0000 g | INTRAVENOUS | Status: DC
  Administered 2017-11-09: 1 g via INTRAVENOUS
  Filled 2017-11-09: qty 1

## 2017-11-09 MED ORDER — PROMETHAZINE HCL 25 MG/ML IJ SOLN
12.5000 mg | Freq: Four times a day (QID) | INTRAMUSCULAR | Status: DC | PRN
  Administered 2017-11-09 – 2017-11-10 (×3): 12.5 mg via INTRAVENOUS
  Filled 2017-11-09 (×3): qty 1

## 2017-11-09 MED ORDER — FENTANYL CITRATE (PF) 100 MCG/2ML IJ SOLN
100.0000 ug | Freq: Once | INTRAMUSCULAR | Status: AC
  Administered 2017-11-09: 100 ug via INTRAVENOUS
  Filled 2017-11-09: qty 2

## 2017-11-09 MED ORDER — LIDOCAINE 5 % EX PTCH
1.0000 | MEDICATED_PATCH | CUTANEOUS | Status: DC
  Administered 2017-11-10: 1 via TRANSDERMAL
  Filled 2017-11-09 (×4): qty 1

## 2017-11-09 MED ORDER — ALLOPURINOL 100 MG PO TABS: 100.0000 mg | ORAL_TABLET | ORAL | Status: DC

## 2017-11-09 MED ORDER — CARVEDILOL 25 MG PO TABS
25.0000 mg | ORAL_TABLET | Freq: Two times a day (BID) | ORAL | Status: DC
  Administered 2017-11-10: 25 mg via ORAL
  Filled 2017-11-09: qty 1

## 2017-11-09 MED ORDER — SENNOSIDES-DOCUSATE SODIUM 8.6-50 MG PO TABS: 1.0000 | ORAL_TABLET | Freq: Every evening | ORAL | Status: DC | PRN

## 2017-11-09 MED ORDER — IOPAMIDOL (ISOVUE-370) INJECTION 76%
INTRAVENOUS | Status: AC
  Administered 2017-11-09: 80 mL
  Filled 2017-11-09: qty 100

## 2017-11-09 MED ORDER — ISOSORBIDE MONONITRATE ER 30 MG PO TB24
30.0000 mg | ORAL_TABLET | Freq: Every day | ORAL | Status: DC
  Administered 2017-11-10: 30 mg via ORAL
  Filled 2017-11-09: qty 1

## 2017-11-09 MED ORDER — ONDANSETRON HCL 4 MG/2ML IJ SOLN
4.0000 mg | Freq: Once | INTRAMUSCULAR | Status: AC
  Administered 2017-11-09: 4 mg via INTRAVENOUS
  Filled 2017-11-09: qty 2

## 2017-11-09 MED ORDER — APIXABAN 5 MG PO TABS: 5.0000 mg | ORAL_TABLET | Freq: Two times a day (BID) | ORAL | Status: DC

## 2017-11-09 NOTE — ED Notes (Signed)
The c-t tech camr basck for the pt   Pt refusing to go to c-t  Reporting that his pain was still too bad  She told him it had been 30 minutes and that he needed to get the c-t scan done.  He retorted that he was not going until his pain was better.    Dr Betsey Holiday notified

## 2017-11-09 NOTE — CV Procedure (Signed)
STAT 2D echo attempted, but patient refused, said he wanted it after his pain meds in 2 hours.

## 2017-11-09 NOTE — ED Notes (Signed)
Lunch ordered 

## 2017-11-09 NOTE — Progress Notes (Signed)
IV team stated they needed to use a ultrasound machine to find a vein for a new IV site. Another team member is to come with the machine and get a new site started. Will continue to monitor closely.

## 2017-11-09 NOTE — ED Notes (Signed)
When flushing Iv to administer Phenergan pt stated the medication was burning arm. No blood return noted and iv possibly infiltrated. IV team consult placed and will administer medication once new IV line established.

## 2017-11-09 NOTE — ED Notes (Signed)
Pt agreed to go to scan.

## 2017-11-09 NOTE — ED Provider Notes (Signed)
  Physical Exam  BP (!) 156/108   Pulse 99   Temp 98.7 F (37.1 C) (Oral)   Resp 16   SpO2 99%   Physical Exam  ED Course/Procedures     Procedures  MDM  Patient with right-sided chest pain.  Pleural effusion and potential pneumonia.  Continued pain.  End-stage renal disease.  Also anemia.  Is on anticoagulation.  Will admit to unassigned internal medicine.       Davonna Belling, MD 11/09/17 [phone]
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
## 2017-11-09 NOTE — ED Notes (Signed)
Pt ambulated to restroom. 

## 2017-11-09 NOTE — ED Notes (Signed)
MD made aware of request for pain medication.

## 2017-11-09 NOTE — ED Notes (Signed)
Pt c/o continued chest pain, states it feels like it "moved". Repeat EKG performed.

## 2017-11-09 NOTE — Progress Notes (Addendum)
Received pt to SDU @ 16:45. Pt will not allow RN to place heart monitor. Pt requesting "Bolus shot in his shoulder". MD paged to assist with forming a pain management plan as Dilaudid is not available in I.M. Pt is sitting in bed fully clothed refusing care. RN awaits MD.  IV access obtained @ 18:00 Pt given Dilaudid 28m. Declined pain patch. Refusing to remove clothing. RN will attempt to place heart monitor in 30 mts.
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
## 2017-11-09 NOTE — ED Notes (Signed)
Pt dressed on monitor setting on side of bed. Pt refuses blood draw until Dr. Vicenta Dunning to see him.
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
## 2017-11-09 NOTE — ED Notes (Signed)
The pt has removed his pulse ox and will not keep it on.  c-t tech came to take him to c-t  He would not go until his pain gets better

## 2017-11-09 NOTE — ED Notes (Signed)
Pt requesting dilaudid for pain

## 2017-11-09 NOTE — Progress Notes (Addendum)
Pharmacy Antibiotic Note  Frank [name] is a 52 y.o. male admitted on 11/08/2017 with right-sided chest pain.  CTA negative for PE but positive for pleural effusion with PNA.  Pharmacy has been consulted for vancomycin dosing.  First dose of cefepime ordered.  History of failed kidney transplant and patient is currently on HD.  Unsure of last session.  Afebrile, WBC WNL.   Plan: Vanc 1554m IV x 1.  F/U with HD to schedule maintenance vanc. F/U with continuation of Gram negative coverage  Weight 75.6 kg in March, f/u updated weight     Temp (24hrs), Avg:98.7 F (37.1 C), Min:98.7 F (37.1 C), Max:98.7 F (37.1 C)  Recent Labs  Lab 11/08/17 2348  WBC 5.4  CREATININE 7.36*    CrCl cannot be calculated (Unknown ideal weight.).    Allergies  Allergen Reactions  . Butalbital-Apap-Caffeine Shortness Of Breath, Swelling and Other (See Comments)    Swelling in throat  . Ferrlecit [Na Ferric Gluc Cplx In Sucrose] Shortness Of Breath, Swelling and Other (See Comments)    Swelling in throat, tolerates Venofor  . Minoxidil Shortness Of Breath  . Tylenol [Acetaminophen] Anaphylaxis and Swelling  . Darvocet [Propoxyphene N-Acetaminophen] Hives    Vanc 4/21 >> Cefepime    Firman Petrow D. DMina Marble PharmD, BCPS, BCCCP Pager:  [phone]/21/2019, 9:01 AM   =============================  Addendum: Continue cefepime, received first dose in ED this AM Cefepime 1g IV QHS   Aaran Enberg D. DMina Marble PharmD, BCPS, BCocoaPager:  [phone]/21/2019, 2:06 PM

## 2017-11-09 NOTE — ED Notes (Signed)
ED Provider at bedside. 

## 2017-11-09 NOTE — Progress Notes (Signed)
Attempted to assess and gain IV access for patient. Patient not allowing author to assess area adequately to fully assess for a vein. When redirected, patient refused and directed the area where the assessment can occur, limiting the ability to find a viable vein.

## 2017-11-10 ENCOUNTER — Other Ambulatory Visit: Payer: Self-pay

## 2017-11-10 ENCOUNTER — Inpatient Hospital Stay (HOSPITAL_COMMUNITY): Payer: Medicare Other

## 2017-11-10 DIAGNOSIS — I5042 Chronic combined systolic (congestive) and diastolic (congestive) heart failure: Secondary | ICD-10-CM

## 2017-11-10 DIAGNOSIS — Z992 Dependence on renal dialysis: Secondary | ICD-10-CM

## 2017-11-10 DIAGNOSIS — N2889 Other specified disorders of kidney and ureter: Secondary | ICD-10-CM

## 2017-11-10 DIAGNOSIS — J9811 Atelectasis: Secondary | ICD-10-CM

## 2017-11-10 DIAGNOSIS — N186 End stage renal disease: Secondary | ICD-10-CM

## 2017-11-10 DIAGNOSIS — R0781 Pleurodynia: Secondary | ICD-10-CM

## 2017-11-10 DIAGNOSIS — K863 Pseudocyst of pancreas: Secondary | ICD-10-CM

## 2017-11-10 DIAGNOSIS — Z8679 Personal history of other diseases of the circulatory system: Secondary | ICD-10-CM

## 2017-11-10 DIAGNOSIS — J90 Pleural effusion, not elsewhere classified: Secondary | ICD-10-CM

## 2017-11-10 DIAGNOSIS — Z86711 Personal history of pulmonary embolism: Secondary | ICD-10-CM

## 2017-11-10 DIAGNOSIS — I361 Nonrheumatic tricuspid (valve) insufficiency: Secondary | ICD-10-CM

## 2017-11-10 DIAGNOSIS — R001 Bradycardia, unspecified: Secondary | ICD-10-CM

## 2017-11-10 DIAGNOSIS — K219 Gastro-esophageal reflux disease without esophagitis: Secondary | ICD-10-CM

## 2017-11-10 DIAGNOSIS — I132 Hypertensive heart and chronic kidney disease with heart failure and with stage 5 chronic kidney disease, or end stage renal disease: Principal | ICD-10-CM

## 2017-11-10 DIAGNOSIS — Z7901 Long term (current) use of anticoagulants: Secondary | ICD-10-CM

## 2017-11-10 DIAGNOSIS — Z86718 Personal history of other venous thrombosis and embolism: Secondary | ICD-10-CM

## 2017-11-10 LAB — CBC
HCT: 23 % — ABNORMAL LOW (ref 39.0–52.0)
Hemoglobin: 7.4 g/dL — ABNORMAL LOW (ref 13.0–17.0)
MCH: 26.5 pg (ref 26.0–34.0)
MCHC: 32.2 g/dL (ref 30.0–36.0)
MCV: 82.4 fL (ref 78.0–100.0)
Platelets: 199 10*3/uL (ref 150–400)
RBC: 2.79 MIL/uL — ABNORMAL LOW (ref 4.22–5.81)
RDW: 18.1 % — AB (ref 11.5–15.5)
WBC: 9.6 10*3/uL (ref 4.0–10.5)

## 2017-11-10 LAB — BASIC METABOLIC PANEL
Anion gap: 19 — ABNORMAL HIGH (ref 5–15)
BUN: 60 mg/dL — AB (ref 6–20)
CHLORIDE: 97 mmol/L — AB (ref 101–111)
CO2: 19 mmol/L — ABNORMAL LOW (ref 22–32)
CREATININE: 10.47 mg/dL — AB (ref 0.61–1.24)
Calcium: 9.4 mg/dL (ref 8.9–10.3)
GFR calc Af Amer: 6 mL/min — ABNORMAL LOW (ref >60)
GFR calc non Af Amer: 5 mL/min — ABNORMAL LOW (ref >60)
GLUCOSE: 44 mg/dL — AB (ref 65–99)
Potassium: >7.5 mmol/L (ref 3.5–5.1)
SODIUM: 135 mmol/L (ref 135–145)

## 2017-11-10 LAB — ECHOCARDIOGRAM COMPLETE
HEIGHTINCHES: 67 in
Weight: 2765.45 oz

## 2017-11-10 MED ORDER — ATROPINE SULFATE 1 MG/10ML IJ SOSY
PREFILLED_SYRINGE | INTRAMUSCULAR | Status: AC
  Administered 2017-11-10: 0.5 mg
  Filled 2017-11-10: qty 10

## 2017-11-10 MED ORDER — ATROPINE SULFATE 1 MG/ML IJ SOLN: 0.5000 mg | Freq: Once | INTRAMUSCULAR | Status: DC

## 2017-11-10 MED ORDER — GLUCAGON HCL RDNA (DIAGNOSTIC) 1 MG IJ SOLR
5.0000 mg | Freq: Once | INTRAVENOUS | Status: DC
  Filled 2017-11-10 (×3): qty 5

## 2017-11-10 MED ORDER — AMOXICILLIN-POT CLAVULANATE 500-125 MG PO TABS
1.0000 | ORAL_TABLET | ORAL | Status: DC
  Administered 2017-11-10 – 2017-11-11 (×2): 500 mg via ORAL
  Filled 2017-11-10 (×4): qty 1

## 2017-11-10 MED ORDER — HEPARIN SODIUM (PORCINE) 1000 UNIT/ML DIALYSIS: 1000.0000 [IU] | INTRAMUSCULAR | Status: DC | PRN

## 2017-11-10 MED ORDER — DIPHENHYDRAMINE HCL 25 MG PO CAPS
25.0000 mg | ORAL_CAPSULE | Freq: Once | ORAL | Status: AC | PRN
  Administered 2017-11-10: 25 mg via ORAL
  Filled 2017-11-10: qty 1

## 2017-11-10 MED ORDER — HYDROMORPHONE HCL 2 MG PO TABS
1.0000 mg | ORAL_TABLET | Freq: Once | ORAL | Status: DC
  Filled 2017-11-10: qty 1

## 2017-11-10 MED ORDER — GLUCAGON HCL RDNA (DIAGNOSTIC) 1 MG IJ SOLR
2.0000 mg/h | INTRAVENOUS | Status: DC
  Administered 2017-11-10: 2 mg/h via INTRAVENOUS
  Filled 2017-11-10: qty 5

## 2017-11-10 MED ORDER — SODIUM CHLORIDE 0.9 % IV SOLN: 100.0000 mL | INTRAVENOUS | Status: DC | PRN

## 2017-11-10 MED ORDER — HYDROMORPHONE HCL 2 MG PO TABS: 1.0000 mg | ORAL_TABLET | ORAL | Status: DC | PRN

## 2017-11-10 MED ORDER — NALOXONE HCL 0.4 MG/ML IJ SOLN
INTRAMUSCULAR | Status: AC
  Filled 2017-11-10: qty 1

## 2017-11-10 MED ORDER — SODIUM CHLORIDE 0.9 % IV SOLN
1.0000 g | Freq: Once | INTRAVENOUS | Status: DC
  Filled 2017-11-10: qty 10

## 2017-11-10 MED ORDER — PENTAFLUOROPROP-TETRAFLUOROETH EX AERO
1.0000 "application " | INHALATION_SPRAY | CUTANEOUS | Status: DC | PRN
  Filled 2017-11-10: qty 30

## 2017-11-10 MED ORDER — ATROPINE SULFATE 1 MG/10ML IJ SOSY
PREFILLED_SYRINGE | INTRAMUSCULAR | Status: AC
  Administered 2017-11-10: 0.5 mg via INTRAMUSCULAR
  Filled 2017-11-10: qty 10

## 2017-11-10 MED ORDER — LIDOCAINE-PRILOCAINE 2.5-2.5 % EX CREA
1.0000 "application " | TOPICAL_CREAM | CUTANEOUS | Status: DC | PRN
  Filled 2017-11-10: qty 5

## 2017-11-10 MED ORDER — ATROPINE SULFATE 1 MG/ML IJ SOLN
0.5000 mg | Freq: Once | INTRAMUSCULAR | Status: AC
  Administered 2017-11-10: 0.5 mg via INTRAMUSCULAR
  Filled 2017-11-10: qty 0.5

## 2017-11-10 MED ORDER — NALOXONE HCL 0.4 MG/ML IJ SOLN
0.4000 mg | INTRAMUSCULAR | Status: DC | PRN
  Administered 2017-11-10: 0.4 mg via INTRAVENOUS

## 2017-11-10 MED ORDER — ALTEPLASE 2 MG IJ SOLR: 2.0000 mg | Freq: Once | INTRAMUSCULAR | Status: DC | PRN

## 2017-11-10 MED ORDER — LIDOCAINE HCL (PF) 1 % IJ SOLN: 5.0000 mL | INTRAMUSCULAR | Status: DC | PRN

## 2017-11-10 NOTE — Progress Notes (Signed)
Verbal order at bedside to administer Atropine 0.65m IM - given in right outer quadrant.  HR remains in mid 30s - 434s  IV Team at bedside to place new PIV.

## 2017-11-10 NOTE — Progress Notes (Signed)
  Echocardiogram 2D Echocardiogram has been performed.  Frank [name] Frank [name] 11/10/2017, 10:06 AM

## 2017-11-10 NOTE — Progress Notes (Signed)
Patient refusing all IV medications stating they will make him worse.  Patient educated and still refuses. MD notified. Will continue to monitor.

## 2017-11-10 NOTE — Progress Notes (Signed)
Subjective: Pt initially evaluated on morning rounds, reported still being uncomfortable with similar pain. Nausea improved and wanted to try increase PO intake.   He then developed bradycardia down to 30s-40s with associated symptoms of diaphoresis, fatigue.  He was alert and oriented initially and throughout evaluation, maintained normal blood pressure.  He was noted to received carvedilol 25 mg earlier that morning.  This dose was based on previous 2 hospitalizations and reported doses at the time.  It appears pharmacy med reconciliation noted last carvedilol prescription filled was for 6.25 mg and a month supply was prescribed back in February, indicating the patient has likely not been taking carvedilol.  Unfortunately, his IV was noted to be infiltrated when attempting an initial dose of atropine.  Atropine was subsequently given IM (2 IM doses total) which led to temporary improvement in his heart rate.  IV access was attempted to be reestablished with assistance with IV team on 2 different occasions but was unsuccessful.  Given access difficulties, PCCM assisted with placing central line.  Following procedure completion, heart rate was noted to be in the 70s.  Cardiology subsequently evaluated the patient and felt he would benefit from dialysis which he is due for today.  He was monitored on telemetry and he unfortunately returned to bradycardia.  At this point, glucagon initiated for treatment of beta-blocker toxicity, calcium gluconate also given.  Should be receiving dialysis this afternoon/evening.   Objective:  Vital signs in last 24 hours: Vitals:   11/10/17 0400 11/10/17 0745 11/10/17 0747 11/10/17 1238  BP:  (!) 157/93  (!) 146/98  Pulse:    (!) 38  Resp: 16   (!) 26  Temp:   97.6 F (36.4 C) 97.6 F (36.4 C)  TempSrc:   Oral Oral  SpO2:   97% 95%  Weight:      Height:       General: Resting in bed CV: Bradycardic, s1, s2  Resp: Diminished breath sounds on R base, normal  work of breathing, no distress  Abd: Soft, +BS, nontender  Extr: No LE edmea,  Neuro: Alert and oriented x3 throughout  Skin: Warm, dry      Assessment/Plan:  Bradycardia, Suspected BB Toxicity Events as above.  In summary, patient likely bradycardic with junctional rhythms due to excess beta-blocker.  IV access has been established and will attempt glucagon infusion for improvement in heart rate as carvedilol continues to be eliminated.  ESRD on MWF HD, Renal Mass No reports of missed HD sessions though reports last session was cut short due to complaints of pain. Cardiology feel his bradycardia and junctional rhythms hopefully improve with dialysis, should received tonight. --Appreciate Nephrology assistance --HD tonight per Nephro   R Pleural Effusion Presented with right-sided chest pain due to underlying right pleural effusion noted on CT.  Multiple recent admissions for similar with thoracentesis showing transudative process, likely related to heart failure and ESRD history.  Will monitor and consider repeat thoracentesis for symptom relief pending management of above.  Pain Control Had received initial doses of IV Dilaudid with plan to transition to p.o. with improved nausea today prior to bradycardic events.  Holding further narcotics until patient stabilizes.  H/o HF, Pericardial Effusion History EF 62-86% with diastolic dysfunction right-sided failure, also with pericardial effusion in the past.  Repeat echo here with improved EF, small pericardial effusion not hemodynamically significant.  Will add back home regimen as heart rate allows.  Dispo: Anticipated discharge pending clinical management   Frank [name],  Frank Downer, MD 11/10/2017, 4:57 PM Pager: [phone]

## 2017-11-10 NOTE — Progress Notes (Addendum)
Paged by patient's nurse that Frank [name] was refusing to wear cardiac heart monitor. Went to bedside to talk to the patient. Patient states he feels much better after HD. States they removed 9 lbs today in HD. When I asked the patient why he does not want to wear the cardiac monitor he states that he does not need to wear the monitor because his low HR earlier  today was due to his high potassium level. Per patient, now that he has had dialysis his potassium is fixed and there is no longer a risk of his HR becoming slow. The patient states that he repeatedly told his doctors that it was his potassium that was causing him to have a low HR, but they did not listen. He also stated that his potassium on arrival to the hospital was 71 and today it was 75. Per chart review his potassium level was 4.8 on the day of admission. Earlier this evening the patient also refused glucagon gtt as he stated it made him feel worse and he is better now so he does not need it.    I strongly recommended the patient wear the heart monitor and explained the risks of him not wearing it: including his medical team not being able to monitor if he has a recurrence of his low heart rate, an abnormal rhythm or arrythmia, and/or even his heart stopping. The patient kept interrupting me throughout the entirety of our conversation, stating he will absolutely not put the monitor back on. I reiterated multiple times this was against my recommendations. He then preceded to tell me all he cares about right now is getting a meal to eat.   Plan: -Q2 vital signs, appreciate nursings help with the care of this patient  -Will monitor closely -Please notify MD on call if patient's HR were to drop below 60 BPM  Frank Chaco, MD Internal Medicine PGY1 Pager # [phone]

## 2017-11-10 NOTE — Progress Notes (Signed)
Patient refusing safety interventions such as non-skid socks and chair/bed alarms. Patient educated and still refuses.

## 2017-11-10 NOTE — Progress Notes (Signed)
Patient w/rhythm change again; EKG repeated and Dr. Claiborne Billings, cardiology, arrives at bedside to review - possible accelerated junctional at this time.  BP elevated at 156/108 (123).  CVC site seeping - upon placement site was bleeding and soaked thru biopatch prior to even placing the dressing; NP-C applied pressure at the site and replaced biopatch prior to dressing; will continue to monitor.  NP-C was unable to advance CVC catheter more than 17cm due to possible occlusion - catheter stitched into place; medial and proximal ports draw but distal port does not.

## 2017-11-10 NOTE — Progress Notes (Signed)
Patient medicated w/Narcan 0.33m per verbal order at bedside w/three physicians.  Patient remains w/HR low 40s and w/[place] resps.

## 2017-11-10 NOTE — Progress Notes (Signed)
Temporary pacing pads placed on patient, inactive at this time, in case patient needs support prior to central line placement.  Patient presently w/irregular HR @ 35; BP 142/77.  Physician residents remain at bedside.

## 2017-11-10 NOTE — Progress Notes (Signed)
Received pt from [place], RN, HD tx initiated via 15Gx2 @ 1755 w/o problem per report, pull/push/flush well w/o problem per report, VSS per report, will cont to monitor while on HD tx

## 2017-11-10 NOTE — Progress Notes (Signed)
Patient refusing to wear heart monitor.  Patient educated on importance of wearing heart monitor especially with the cardiac events from earlier today.  Patient states that his heart rate is fine now and it won't change.  Also states that he has been misdiagnosed this admission.  MD notified and states she will come discuss matters with him.

## 2017-11-10 NOTE — Progress Notes (Signed)
This Probation officer requested Dr. Dareen Piano to assess patient at bedside for IV line access.  Expect new orders for central line to be placed.  Patient HR remains in the 30-40s; BP 139/76.  Patient remains alert and oriented and verbally agrees w/central line placement.

## 2017-11-10 NOTE — Progress Notes (Signed)
Patient transported to HD per RN/monitor.  Report given to [place], RN and Dr. Florene Glen also at bedside.  Pt transferred to HD monitors for treatment.  Stat labs pending. Glucagon gtt infusing at 57m/hr.  Will await return to unit.

## 2017-11-10 NOTE — Progress Notes (Signed)
Patient refuses to have his room cleaned per housekeeping at this time.

## 2017-11-10 NOTE — Progress Notes (Signed)
Patient dips HR back down into the 40s; physicians remain at bedside and are aware. BP 155/95 (114).  Patient is awake, alert and oriented.  Able to speak in short sentences.  Continues to exhibit mild diaphoresis; has a fan in place per his request.  RR 28, regular and non-labored at this point.

## 2017-11-10 NOTE — Progress Notes (Signed)
Patient being monitored on tele - HR was accelerated junctional in the 90s then witnessed w/long pauses that led to HR in the 30s, recovering to HR in the 70s in NSR w/1st degree AVB/BBB.  Physician paged.

## 2017-11-10 NOTE — Progress Notes (Signed)
Patient c/o left sided rib pain.  MD notified.  Orders obtained for one time dose of 74m PO Diliaudid.  Patient refuses to take this stating he is nauseated and "anything that goes down will come up".  MD notified.  No new orders at this time.

## 2017-11-10 NOTE — Progress Notes (Signed)
Dr. Johny Chess to bedside for evaluation.  Orders to begin Glucagon bolus and gtt per orders.  Also notes calcium gluconate - orders to be given.  Patient now w/HR 27s

## 2017-11-10 NOTE — Progress Notes (Signed)
Patient's IJ dressing is soiled.  Patient states understanding of reasons for change being needed but refuses for this to be done at this time.  RN will follow up and monitor.

## 2017-11-10 NOTE — Progress Notes (Signed)
Patient converts to NSR w/wide QRS noted.  EKG completed - shows NSR w/left BBB and 1st degree AVB.  Given to physicians for verification.

## 2017-11-10 NOTE — Progress Notes (Signed)
Date: 11/10/2017  Patient name: Frank [name]  Medical record number: [id number]  Date of birth: Nov 19, 1965   I have seen and evaluated Frank [name] and discussed their care with the Residency Team.  In brief, patient is a 52 year old male with past medical history of end-stage renal disease on hemodialysis, hypertension, chronic combined systolic and diastolic heart failure, DVT/PE on Eliquis, pancreatic pseudocyst, GERD presented to the ED with right-sided chest pain x1 day.  Patient initially presented to the ED with 1 day of right-sided chest pain which progressively worsened since onset.  Pain is pleuritic in nature and is reproducible to palpation and worsens with deep inspiration.  Patient complains of associated nausea and vomiting but denies fevers or chills.  No abdominal pain, no diarrhea.  Patient has been recently admitted at [facility] for the management of a right-sided pleural effusion.  He was discharged with a prescription for Dilaudid and states that his pain worsened once his prescription ran out.  No syncope, no headache, no blurry vision, no focal weakness, tingling or numbness.  Patient had a CTA in the ED which showed a small to moderate right-sided pleural effusion with adjacent atelectasis and right-sided patchy opacities possibly infectious.  Patient was started on pain control received vancomycin and cefepime for possible pneumonia.  Today patient was initially feeling better when I saw him on rounds this morning and wanted to try taking orally.  However, patient received his carvedilol dose after this and had bradycardia down to 30s.  He remained oriented and conversant through this and his blood pressure remained stable but he did feel nauseous and lightheaded.  He received 2 doses of atropine which temporarily improved his heart rate.  Critical care was called for placement of a central line as we were unable to obtain peripheral IV access.  Cardiology was consulted as  well.  PMHx, Fam Hx, and/or Soc Hx : As per resident admit note  Vitals:   11/10/17 0747 11/10/17 1238  BP:  (!) 146/98  Pulse:  (!) 38  Resp:  (!) 26  Temp: 97.6 F (36.4 C) 97.6 F (36.4 C)  SpO2: 97% 95%   General: Awake, alert, oriented x3, patient appears uncomfortable and diaphoretic CVS: Bradycardic Lungs: CTA bilaterally Abdomen: Soft, nontender, nondistended, normoactive bowel sounds Extremities: Left upper extremity aVF present, no lower extremity edema noted  Assessment and Plan: I have seen and evaluated the patient as outlined above. I agree with the formulated Assessment and Plan as detailed in the residents' note, with the following changes:   1.  Symptomatic bradycardia: -Patient initially had a normal heart rate but after receiving his carvedilol this morning his heart rate decreased to 30s and patient became diaphoretic and lightheaded.  He did maintain his blood pressures in the 580D to 983J systolic and his heart rate temporarily responded to atropine. -Patient lost IV access and the IV team was unable to obtain peripheral access -Given his persistent bradycardia CCM consulted for central line placement -Patient will likely need glucagon infusion -Cardiology consulted.  Will await recommendations -Temporary pacing pads placed on patient.  If patient becomes unstable will attempt transcutaneous pacing -Hold all rate controlling medications especially carvedilol -Repeat EKG shows a left bundle branch block.  Will discuss with cardiology regarding further work-up -If patient has persistent bradycardia would consider closer monitoring in CCU  2.  Right-sided pleural effusion/chest pain: -Patient initially presented to the ED with right-sided chest pain secondary to underlying right-sided pleural effusion.  He was recently admitted with similar symptoms to [facility] (April 10 to April 15) and had a thoracentesis done which showed a transudate of process.  He also had a  thoracentesis done on April 5 for similar complaints. -Patient was noted to have right-sided patchy opacities possibly aspiration pneumonia in the setting of nausea and vomiting.  We will continue with antibiotics for now.  If MRSA swab is negative would DC vancomycin and continue cefepime alone -We will consider repeat thoracentesis for symptomatic relief once patient stabilizes -2D echo results noted.  EF appears to be improved to 35 to 40% and he has only a small pericardial effusion at this time  Aldine Contes, MD 4/22/20191:55 PM

## 2017-11-10 NOTE — Progress Notes (Signed)
Attempted to medicate patient w/Atropine 0.62m IVP - patient't only PIV infiltrated.  STAT order placed for IV team to place new PIV.

## 2017-11-10 NOTE — Progress Notes (Signed)
CXR reviewed.  Central line ok for use.     Noe Gens, NP-C [facility] Pgr: [phone] or if no answer [phone] 11/10/2017, 3:02 PM

## 2017-11-10 NOTE — Progress Notes (Signed)
Interval Event: Today Frank [name] experienced a prolonged episode of bradycardia with heart rate down to the 30s after receiving his carvedilol 25 mg. Bradycardic episode lasted for approximately 1.5 - 2hrs. During this time patient remained conversant but somewhat somnolent. BP remained stable with systolic 001V - 494W. During this episode patient reported fatigue, mild dyspnea, transient blurry vision, and was noticeably diaphoretic but remained afebrile. Administered atropine IM X 2 with HR stabilizing in the 70's after the second dose. Central line placed by critical care after difficulties obtaining peripheral IV access. EKG notable for hyperkalemia and LBBB. Cardiology consulted. May consider dialysis since patient has not had a session since 4/19.  Subjective: No acute events overnight. Reports improvement in nausea and vomiting and wants to eat food. Still endorsing some left wall chest tenderness on physical exam.  Objective:  Vital signs in last 24 hours: Vitals:   11/10/17 0400 11/10/17 0745 11/10/17 0747 11/10/17 1238  BP:  (!) 157/93  (!) 146/98  Pulse:    (!) 38  Resp: 16   (!) 26  Temp:   97.6 F (36.4 C) 97.6 F (36.4 C)  TempSrc:   Oral Oral  SpO2:   97% 95%  Weight:      Height:       Weight change:   Intake/Output Summary (Last 24 hours) at 11/10/2017 1520 Last data filed at 11/10/2017 0747 Gross per 24 hour  Intake 190 ml  Output -  Net 190 ml   General appearance: alert, no distress and resting in bed. Head: Normocephalic, without obvious abnormality, atraumatic Lungs: clear to auscultation bilaterally, normal WOB Chest wall: left sided costochondral tenderness Heart: regular rate and rhythm, S1, S2 normal Abdomen: Nontender, nondistended with normoactive bowel sounds. Extremities: extremities normal, atraumatic, no cyanosis or edema Skin: Warm and dry Neurologic: Grossly normal   Assessment/Plan:  Principal Problem:   Pleuritic chest pain Active  Problems:   Chronic combined systolic and diastolic congestive heart failure (HCC)   ESRD on dialysis (HCC)  Right Sided Pleuritic Chest Pain: Patient has had recurrent episodes of chest pain in the past suggesting a more chronic process. Work-up so far has been negative for PE and acute MI. Right lower lobe infiltrates likely contributing factor to chronic chest pain. Patient has required thoracentesis in the past for pleural effusion. Received cefepime/vanc in the ED. Currently afebrile with no leukocytosis. - Monitor vitals - Holding abx for now - May repeat thoracentesis  Pain Management: Patient has a history of requiring opioids for pain management. Currently requesting Dilaudid which has worked for him in the past. Recently received Dilaudid during his prior hospitalization. - Tylenol PRN - Discontinued dilaudid for now due to recent bradycardic episode - Lidocaine patch  - Phenergan q6hr prn  Bradycardia: Recent episode of bradycardia likely resulting from administration of Carvedilol 25 mg. BP has remained stable and EKG obtained 4/22 notable for LBBB and T-wave elevation. Cardiology has been consulted.  - Holding home bp meds - Glucagon bolus/infusion if HR < 55 - Cardiology consulted - Echo today with LV EF 35 - 40%, small pericardial effusion  Anemia: Hbg 7.5 on admission likely caused by underlying renal disease - Monitor CBC  ESRD, Renal mass: Creatinine on admission at 7.36. Reports compliance with outpatient dialysis treatment. Will consider dialysis if needed. Left renal mass noted on prior imaging from [place]. Pending further work-up.  History of Combined HF: Recent echo notable for LV EF 96-75% with diastolic dysfunction and cor pulmonale. Outpatient  medications include Coreg, imdur, lisinopril, hydral, amlodipine. - Currently holding home BP medications due to bradycardic episode - Echo today  History of DVT: Was previously on Coumadin but was switched to Eliquis  due to poor compliance. Has not taken since 4/19. Will hold Eliquis for now pending potential procedure.  History of Pancreatic Pseudocyst: Admitted and treated at [facility] earlier this month for infected pseudocyst with stent and drainage.   Dispo: Anticipated discharge pending clinical workup and management    LOS: 1 day   Frank [name], Medical Student 11/10/2017, 3:20 PM

## 2017-11-10 NOTE — Progress Notes (Signed)
Patient is SB in the 30-40s.  BP 132/67 (87).  Physician paged and made aware.  Discontinued Coreg order - given 74m PO at 0747 per physician order.  [place] resps noted.

## 2017-11-10 NOTE — Consult Note (Addendum)
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 has prolonged QTc interval.  All his medications have been on hold.  His blood pressure now is elevated at 160/105.  He is not having any chest pain.  It is difficult to get a good history from him.  JVD is mildly increased.  There was no wheezing.  He had decreased breath sounds, rhythm was regular.  I did not appreciate any friction rub.  There was 1/6 systolic murmur.  Abdomen was mildly distended and nontender.  There was no significant edema.  There is no focal neurologic findings.  CTA done yesterday revealed a small to moderate size right pleural effusion with compressive atelectasis to the right lower lobe.  A possible pneumonia in the posterior lateral aspect of the right upper lobe and right lower lobe could not be excluded.  There were changes of COPD with centrilobular emphysema.  There was evidence for coronary atherosclerosis as well as aortic atherosclerosis.  Presently, his blood pressure is elevated. He is not having chest pain and heart rhythm is regular and may be a junctional rhythm rather than sinus.  Would consider  dialysis later today since this was held from this morning due to his probable carvedilol induced bradycardia arrhythmia. There is some concern for multiple evaluations and prescriptions picked up in different pharmacies over the past year for control pain meds.  Ultimately, with his LV dysfunction, consider nitrate/hydralazine and he has been on ACE inhibition.  Would follow-up chest x-ray to reassess effusion and potential infiltrate.  Will follow.  Troy Sine, MD, [facility] 11/10/2017 3:06 PM
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
## 2017-11-10 NOTE — Procedures (Signed)
Central Venous Catheter Insertion Procedure Note [id number] FAVILA [id number] 07-17-1966  Procedure: Insertion of Central Venous Catheter Indications: Assessment of intravascular volume, Drug and/or fluid administration and Frequent blood sampling  Procedure Details Consent: Risks of procedure as well as the alternatives and risks of each were explained to the (patient/caregiver).  Consent for procedure obtained. Time Out: Verified patient identification, verified procedure, site/side was marked, verified correct patient position, special equipment/implants available, medications/allergies/relevent history reviewed, required imaging and test results available.  Performed  Maximum sterile technique was used including antiseptics, cap, gloves, gown, hand hygiene, mask and sheet.  Skin prep: Chlorhexidine; local anesthetic administered A antimicrobial bonded/coated triple lumen catheter was placed in the left internal jugular vein using the Seldinger technique.  Line placed to 17 cm, sutured x3, biopatch applied, sterile dressing placed.  Evaluation Blood flow good Complications: No apparent complications Patient did tolerate procedure well. Chest X-ray ordered to verify placement.  CXR: pending.  Procedure performed under direct supervision of Dr. Pearline Cables and with ultrasound guidance for real time vessel cannulation.     Noe Gens, NP-C [facility] Pgr: [phone] or if no answer [phone] 11/10/2017, 2:16 PM

## 2017-11-11 DIAGNOSIS — Z888 Allergy status to other drugs, medicaments and biological substances status: Secondary | ICD-10-CM

## 2017-11-11 DIAGNOSIS — Z8719 Personal history of other diseases of the digestive system: Secondary | ICD-10-CM

## 2017-11-11 DIAGNOSIS — D649 Anemia, unspecified: Secondary | ICD-10-CM

## 2017-11-11 DIAGNOSIS — Z95828 Presence of other vascular implants and grafts: Secondary | ICD-10-CM

## 2017-11-11 DIAGNOSIS — E875 Hyperkalemia: Secondary | ICD-10-CM

## 2017-11-11 DIAGNOSIS — Z885 Allergy status to narcotic agent status: Secondary | ICD-10-CM

## 2017-11-11 LAB — CBC
HCT: 23.2 % — ABNORMAL LOW (ref 39.0–52.0)
Hemoglobin: 7.4 g/dL — ABNORMAL LOW (ref 13.0–17.0)
MCH: 26.1 pg (ref 26.0–34.0)
MCHC: 31.9 g/dL (ref 30.0–36.0)
MCV: 82 fL (ref 78.0–100.0)
PLATELETS: 204 10*3/uL (ref 150–400)
RBC: 2.83 MIL/uL — ABNORMAL LOW (ref 4.22–5.81)
RDW: 18 % — AB (ref 11.5–15.5)
WBC: 7 10*3/uL (ref 4.0–10.5)

## 2017-11-11 LAB — BASIC METABOLIC PANEL
Anion gap: 11 (ref 5–15)
BUN: 22 mg/dL — AB (ref 6–20)
CALCIUM: 8.8 mg/dL — AB (ref 8.9–10.3)
CO2: 27 mmol/L (ref 22–32)
CREATININE: 5.48 mg/dL — AB (ref 0.61–1.24)
Chloride: 98 mmol/L — ABNORMAL LOW (ref 101–111)
GFR calc Af Amer: 12 mL/min — ABNORMAL LOW (ref >60)
GFR, EST NON AFRICAN AMERICAN: 11 mL/min — AB (ref >60)
GLUCOSE: 105 mg/dL — AB (ref 65–99)
POTASSIUM: 4.3 mmol/L (ref 3.5–5.1)
SODIUM: 136 mmol/L (ref 135–145)

## 2017-11-11 LAB — HIV ANTIBODY (ROUTINE TESTING W REFLEX): HIV SCREEN 4TH GENERATION: NONREACTIVE

## 2017-11-11 MED ORDER — DICLOFENAC SODIUM 1 % TD GEL: 2.0000 g | Freq: Four times a day (QID) | TRANSDERMAL | 1 refills | Status: DC | PRN

## 2017-11-11 MED ORDER — AMOXICILLIN-POT CLAVULANATE 500-125 MG PO TABS: 1.0000 | ORAL_TABLET | ORAL | 0 refills | Status: AC

## 2017-11-11 MED ORDER — DIPHENHYDRAMINE HCL 25 MG PO CAPS
25.0000 mg | ORAL_CAPSULE | Freq: Once | ORAL | Status: AC
  Administered 2017-11-11: 25 mg via ORAL
  Filled 2017-11-11: qty 1

## 2017-11-11 MED ORDER — APIXABAN 5 MG PO TABS: 5.0000 mg | ORAL_TABLET | Freq: Two times a day (BID) | ORAL | 0 refills | Status: DC

## 2017-11-11 MED ORDER — HYDROCERIN EX CREA
TOPICAL_CREAM | Freq: Two times a day (BID) | CUTANEOUS | Status: DC
  Filled 2017-11-11: qty 113

## 2017-11-11 MED ORDER — LISINOPRIL 5 MG PO TABS: 5.0000 mg | ORAL_TABLET | Freq: Every day | ORAL | 0 refills | Status: DC

## 2017-11-11 MED ORDER — HYDROMORPHONE HCL 1 MG/ML IJ SOLN
1.0000 mg | Freq: Once | INTRAMUSCULAR | Status: AC
  Administered 2017-11-11: 1 mg via INTRAVENOUS
  Filled 2017-11-11: qty 1

## 2017-11-11 NOTE — Progress Notes (Signed)
Patient improved after urgent HD yesterday and correction of symptomatic hyperkalemia. We will arrange OP HD. I emphasized excellent HD adherence. Estanislado Emms, MD

## 2017-11-11 NOTE — Progress Notes (Signed)
Internal Medicine Attending:   I saw and examined the patient. I reviewed the resident's note and I agree with the resident's findings and plan as documented in the resident's note.  Patient feels well today with no new complaints.  Patient is initially admitted for right-sided chest pain and was found to have recurrent right-sided pleural effusion.  Patient has been admitted to [facility] and [facility] recently and was noted to have a right-sided pleural effusion at that time as well which was transudative.  I suspect that the recurrent right-sided pleural effusion will improve with hemodialysis as he has had multiple sessions cut short as an outpatient.  Patient's hospital course was complicated by severe symptomatic bradycardia yesterday in the setting of receiving carvedilol as well as severe hyperkalemia.  Patient had a central line placed and was given calcium gluconate as well as a glucagon infusion and then had hemodialysis.  Potassium today is 4.3 and his heart rate has stabilized.  Continue with hemodialysis per nephrology.  No further work-up at this time.  Patient is stable for discharge home today.  Complete course of Augmentin for possible pneumonia.

## 2017-11-11 NOTE — Progress Notes (Addendum)
Paged by patient's nurse that he was threatening to take out his central line IJ and wanted to leave because he was not receiving IV pain medications. Went to bedside to see the patient. Patient states his pain is located on his left side where previous thoracentesis were performed, which causes him to have difficulty breathing. He states he has been experiencing nausea and vomiting, and he would not tolerate any PO medications. Nursing did not see the patient have an episode of emesis. I asked if he would consider trying to take the dialudid tablet because he had received a dose of phenergan, which should help the nausea. Patient states the phenergan did nothing to help his nausea and he is not willing to try PO medication. He states he wants IV and would only require one dose. After discussion about side effects of pain medication on HR/RR, patient agreed to wear the tele monitor when receiving dose of IV dilaudid.   Plan: -Cardiac telemetry -Vitals stable prior to IV dilaudid -  Blood pressure (!) 149/91, pulse 92, temperature 98 F (36.7 C), resp. rate 14,  SpO2 91 %. -Giving one time dose of 1 mg of IV dilaudid -Defer further pain management to day team   Arvil Chaco, MD Internal Medicine PGY1 Pager # [phone]

## 2017-11-11 NOTE — Progress Notes (Signed)
   Subjective: Pt received HD last night and tolerated well with improvement in HR and symptoms. Overnight, again had issues regarding pain control and behavior surrounding this issue. Counseled on appropriateness of pain control, no prescription for narcotics to be given, and also on the need for establishing a PCP and attending all outpatient HD treatments.   Objective:  Vital signs in last 24 hours: Vitals:   11/10/17 2351 11/11/17 0138 11/11/17 0318 11/11/17 0700  BP: (!) 158/98 (!) 149/91 140/82 126/85  Pulse: 89 92 93 86  Resp: 18 14 (!) 26 16  Temp: 98 F (36.7 C)  97.8 F (36.6 C) 98 F (36.7 C)  TempSrc: Oral  Oral Oral  SpO2: 99% 91% 92%   Weight:      Height:       General: Resting in bed comfortably, central line in place  CV: Now RRR Resp: Diminished breath sounds on R base, normal work of breathing, no distress  Abd: Soft, +BS, nontender  Extr: No LE edmea  Neuro: Alert and oriented x3  Skin: Warm, dry      Assessment/Plan:  Bradycardia, Suspected BB Toxicity + Hyperkalemia See progress note 4/22 for full details. In summary, patient likely bradycardic with junctional rhythms, widened QRS due to excess beta-blocker and hyperkalemia. Difficult IV access precluded confirming K on labs or starting therapy but Ca gluconate and glucagon infusion started once central line placed--HR stabilized and pt subsequently able to have dialysis with labs drawn prior to starting showing K >7.5. HR has improved this morning and pt now declining tele. K 4.3 on morning labs.   ESRD on MWF HD, Renal Mass No reports of missed HD sessions though reports last session was cut short due to complaints of pain. Further chart review by Nephrology noted multiple sessions cut short and limited outpatient treatments attended though pt has been sporadically admitted at hospitals. K and brady have improved with HD as above.  --Appreciate Nephrology assistance  R Pleural Effusion Presented with  right-sided chest pain due to underlying right pleural effusion noted on CT.  Multiple recent admissions for similar with thoracentesis showing transudative process, likely related to heart failure and ESRD history.  Will monitor and consider repeat thoracentesis for symptom relief pending management of above.  Pain Control Had received initial doses of IV Dilaudid with plan to transition to p.o. with improved nausea today prior to bradycardic events. Once stabilized and pt symptomatically improved, he quickly returned to declining treatments, threatening to pull central line and leave until receiving IV dilaudid doses. When offered PO he reported n/v though none witness and was able to tolerate other PO meds shortly after. Degree of pain out of proportion to clinical findings, has not received enough doses here or based on database to warrant taper. Informed no narcotics will be prescribed on discharged but can try topical voltaren gel, tylenol.   H/o HF, Pericardial Effusion History EF 38-88% with diastolic dysfunction right-sided failure, also with pericardial effusion in the past.  Repeat echo here with improved EF, small pericardial effusion not hemodynamically significant.  Dispo: Anticipated discharge 0-1 day.   Tawny Asal, MD 11/11/2017, 11:28 AM Pager: [phone]

## 2017-11-11 NOTE — Progress Notes (Signed)
RN called to patient's room, patient states "take out this IV or I'll do it myself", referring to IJ.  Patient states he wants to leave.  MD notified.

## 2017-11-11 NOTE — Progress Notes (Addendum)
Progress Note  Patient Name: Frank [name] Date of Encounter: 11/11/2017  Primary Cardiologist: Quay Burow, MD   Subjective   No chest pain no SOB.  Refusing to wear monitor.    Inpatient Medications    Scheduled Meds: . amoxicillin-clavulanate  1 tablet Oral Q24H  . atropine  0.5 mg Intravenous Once  . hydrocerin   Topical BID  . ketorolac  60 mg Intramuscular Once  . lidocaine  1 patch Transdermal Q24H   Continuous Infusions: . sodium chloride    . sodium chloride    . calcium gluconate    . glucagon (GLUCAGEN)  IVPB (bolus for beta blocker/calcium channel bocker overdose)    . glucagon (GLUCAGEN) infusion (for beta blocker/calcium channel blocker overdose) 2 mg/hr (11/10/17 1634)   PRN Meds: sodium chloride, sodium chloride, alteplase, heparin, lidocaine (PF), lidocaine-prilocaine, naLOXone (NARCAN)  injection, pentafluoroprop-tetrafluoroeth, promethazine, senna-docusate   Vital Signs    Vitals:   11/10/17 2351 11/11/17 0138 11/11/17 0318 11/11/17 0700  BP: (!) 158/98 (!) 149/91 140/82 126/85  Pulse: 89 92 93 86  Resp: 18 14 (!) 26 16  Temp: 98 F (36.7 C)  97.8 F (36.6 C) 98 F (36.7 C)  TempSrc: Oral  Oral Oral  SpO2: 99% 91% 92%   Weight:      Height:        Intake/Output Summary (Last 24 hours) at 11/11/2017 0918 Last data filed at 11/11/2017 0800 Gross per 24 hour  Intake 965 ml  Output 4000 ml  Net -3035 ml   Filed Weights   11/09/17 1828 11/10/17 1746 11/10/17 2214  Weight: 172 lb 13.5 oz (78.4 kg) 169 lb 5 oz (76.8 kg) 160 lb 7.9 oz (72.8 kg)    Telemetry    SR no longer wearing monitor he has refused - Personally Reviewed  ECG    No new - Personally Reviewed  Physical Exam   GEN: No acute distress.   Neck: No JVD Cardiac: RRR, no murmurs, rubs, or gallops.  Respiratory: Clear to auscultation bilaterally. Ant. Pt would not sit up GI: Soft, nontender, non-distended  MS: No edema; No deformity. Neuro:  Nonfocal  Psych: Normal  affect to flat.  Labs    Chemistry Recent Labs  Lab 11/08/17 2348 11/10/17 1637 11/11/17 0600  NA 138 135 136  K 4.8 >7.5* 4.3  CL 98* 97* 98*  CO2 26 19* 27  GLUCOSE 103* 44* 105*  BUN 40* 60* 22*  CREATININE 7.36* 10.47* 5.48*  CALCIUM 9.0 9.4 8.8*  GFRNONAA 8* 5* 11*  GFRAA 9* 6* 12*  ANIONGAP 14 19* 11     Hematology Recent Labs  Lab 11/08/17 2348 11/10/17 1637 11/11/17 0600  WBC 5.4 9.6 7.0  RBC 2.91* 2.79* 2.83*  HGB 7.5* 7.4* 7.4*  HCT 24.0* 23.0* 23.2*  MCV 82.5 82.4 82.0  MCH 25.8* 26.5 26.1  MCHC 31.3 32.2 31.9  RDW 17.9* 18.1* 18.0*  PLT 233 199 204    Cardiac EnzymesNo results for input(s): TROPONINI in the last 168 hours.  Recent Labs  Lab 11/09/17 0008  TROPIPOC 0.02     BNPNo results for input(s): BNP, PROBNP in the last 168 hours.   DDimer No results for input(s): DDIMER in the last 168 hours.   Radiology    Dg Chest Port 1 View  Result Date: 11/10/2017 CLINICAL DATA:  Status post central line placement. EXAM: PORTABLE CHEST 1 VIEW COMPARISON:  PA and lateral chest 11/08/2017.  CT chest 11/09/2017. FINDINGS:  New left IJ approach central venous catheter is seen with the tip at the origin of the superior vena cava at the junction of the brachiocephalic veins. Marked cardiomegaly and bilateral pleural effusions, larger on the right, persist. No pneumothorax. IMPRESSION: Left IJ catheter tip projects at the origin of the superior vena cava. Negative for pneumothorax. Right worse than left pleural effusions. Marked cardiomegaly. Electronically Signed   By: Inge Rise M.D.   On: 11/10/2017 14:50    Cardiac Studies   11/10/17 Study Conclusions  - Left ventricle: Wall thickness was increased in a pattern of mild   LVH. Systolic function was moderately reduced. The estimated   ejection fraction was in the range of 35% to 40%. Diffuse   hypokinesis. Left ventricular diastolic function parameters were   normal for the patient&'s age. -  Left atrium: The atrium was severely dilated. - Right ventricle: The cavity size was moderately dilated. Systolic   function was mildly to moderately reduced. - Right atrium: The atrium was moderately to severely dilated. - Tricuspid valve: There was moderate regurgitation. - Pulmonary arteries: Systolic pressure was mildly increased. PA   peak pressure: 38 mm Hg (S). - Pericardium, extracardiac: A small pericardial effusion was   identified circumferential to the heart.  Patient Profile     52 y.o. male with a hx of chronic systolic HF, NICM, ESRD w/ failed renal transplant, now on HD (T, Th, Sat), HTN, h/o DVT and PE, chronic anticoagulation therapy w/ Eliquis, chronic anemia, chronic pancreatitis, pancreatic psuedocyst and history of medication noncompliance and now admitted 11/09/17 for chest pain and found to have Rt sided pleural effusion with recent thoracentesis at [facility] and CHF and bradycardia with cardiomyopathy with EF 20-25%.      Assessment & Plan    Chronic combined systolic and diastolic HF- volume controlled with HD.  Is on lisinopril continue if nephrology agrees. Continue Imdur, BB on hold due to bradycardia.  Was on 25 mg BID not on imdur or ace.    Bradycardia - HR down to 30-40s after coreg this admit.  He was symptomatic with fatigue and needed atropine and rec'd IM due to no IV access.   Unsure how much he was taking at home.   Depending on HR may be able to add back low dose.   Nonischemic cardiomyopathy since 2016 with minimal CAD on cath.  EF 08/2017 is 20-25% and G3 DD,   And echo 11/10/17 was 35-40% LA was severely dilated, RV moderately dilated, RA moderately to severely dilated, Mod. TR, PA pk pressure 38 mmHg, a small pericardial effusion was noted. Trivial MR.   Recurrent pleural effusion.  With thoracentesis at [facility] first of April. Fluid with no growth at 5 days, + RBCs and +WBCs   ESRD after failed transplant on HD. Per Renal  Pericardial effusion small,  will monitor.  Chronic Pain per IM  Chronic anemia hgb 7.4  Per IM and renal.  Chest CT neg PE and Calcific coronary artery and aortic atherosclerosis, neg troponin  HTN BP up to 162/91 now 126/85.    posible PNA on CT chest on ABX  Unfortunately pt's attitude is not helping his condition. Hard to treat if refusing monitor --Dr. Claiborne Billings to see.    For questions or updates, please contact [place] Please consult [email or web address] for contact info under Cardiology/STEMI.      Signed, Cecilie Kicks, NP  11/11/2017, 9:18 AM     Patient seen and examined. Agree with assessment  and plan. Currently refusing to be on monitor. Feels better with dialysis last night. His home dose of carvedilol was 6.25 mg bid; but if to resume would start at 3.125 mg bid. S/p prior thoracentesis with pleuritic pain. Pt was on lisinopril since on dialysis. If cannot tolereate, with LV dysfunction consider nitrates/hydralazine.   Troy Sine, MD, [facility] 11/11/2017 10:50 AM

## 2017-11-11 NOTE — Progress Notes (Signed)
Subjective: Vital signs have remained stable since bradycardic episode yesterday. Reports improvement in nausea and vomiting and is tolerating a regular diet. No specific complaints about chest wall tenderness which has seemed to improve. Does not endorse SOB. Team spoke with Mr Cerda about plan for discharge and following up with a PCP for continued monitoring which he agreed to.  Objective:  Vital signs in last 24 hours: Vitals:   11/10/17 2351 11/11/17 0138 11/11/17 0318 11/11/17 0700  BP: (!) 158/98 (!) 149/91 140/82 126/85  Pulse: 89 92 93 86  Resp: 18 14 (!) 26 16  Temp: 98 F (36.7 C)  97.8 F (36.6 C) 98 F (36.7 C)  TempSrc: Oral  Oral Oral  SpO2: 99% 91% 92%   Weight:      Height:       Weight change: 4.224 kg (9 lb 5 oz)  Intake/Output Summary (Last 24 hours) at 11/11/2017 1059 Last data filed at 11/11/2017 0800 Gross per 24 hour  Intake 965 ml  Output 4000 ml  Net -3035 ml   General appearance: alert, no distress and resting in bed. Head: Normocephalic, without obvious abnormality, atraumatic Lungs: clear to auscultation bilaterally, normal WOB Chest wall: left sided costochondral tenderness Heart: regular rate and rhythm, S1, S2 normal Abdomen: Nontender, nondistended with normoactive bowel sounds. Extremities: extremities normal, atraumatic, no cyanosis or edema Skin: Warm and dry Neurologic: Grossly normal   Assessment/Plan:  Principal Problem:   Pleuritic chest pain Active Problems:   Chronic combined systolic and diastolic congestive heart failure (HCC)   Anemia   End stage renal disease on dialysis (HCC)   Pleural effusion   Junctional bradycardia  Right Sided Pleuritic Chest Pain: Patient has had recurrent episodes of chest pain in the past suggesting a more chronic process. Work-up so far has been negative for PE and acute MI. Right lower lobe infiltrates likely contributing factor to chronic chest pain. Patient has required thoracentesis in  the past for pleural effusion. Received cefepime/vanc in the ED. Currently afebrile with no leukocytosis. Following review of records from recent admissions patients pleural effusion has remained stable and would likely improve from regular dialysis appointments and removal of fluid. - Monitor vitals - Currently on augmentin  Pain Management: Patient has a history of requiring opioids for pain management. Currently requesting Dilaudid which has worked for him in the past. Recently received Dilaudid during his prior hospitalization. - Tylenol PRN - Discontinued dilaudid - Lidocaine patch  - Phenergan q6hr prn  Bradycardia: Recent episode of bradycardia likely resulting from administration of Carvedilol 25 mg. BP has remained stable and EKG obtained 4/22 notable for LBBB and T-wave elevation. Cardiology has been consulted. Labs taken in dialysis yesterday notable for K>7.5.  A combination of hyperkalemia and B-blocker likely contributed to bradycardia. K today down to 4.3. Vital signs have remained stable since recent bradycardia. - Holding home bp meds - Glucagon bolus/infusion if HR < 55 - Cardiology following, appreciate assistance - Echo 4/22 with LV EF 35 - 40%, small pericardial effusion  Anemia: Hbg 7.5 on admission likely caused by underlying renal disease - Monitor CBC  ESRD, Renal mass: Creatinine on admission at 7.36. Reports compliance with outpatient dialysis treatment. Will consider dialysis if needed. Left renal mass noted on prior imaging from [place]. Pending further work-up.  History of Combined HF: Repeat echo with LV EF 35-40% (improvement from prior 20-25%). Outpatient medications include Coreg, imdur, lisinopril, hydral, amlodipine. - Currently holding home BP medications due to  bradycardic episode  History of DVT: Was previously on Coumadin but was switched to Eliquis due to poor compliance. Has not taken since 4/19. Will hold Eliquis for now pending potential  procedure.  History of Pancreatic Pseudocyst: Admitted and treated at [facility] earlier this month for infected pseudocyst with stent and drainage.   Dispo: Anticipated discharge today pending clinical workup and management    LOS: 2 days   Luna Fuse, Medical Student 11/11/2017, 10:59 AM

## 2017-11-11 NOTE — Care Management Note (Signed)
Case Management Note  Patient Details  Name: Frank [name] MRN: [id number] Date of Birth: 1966/05/22  Subjective/Objective:      Pt admitted with  CP          Action/Plan:   PTA independent from home.  Pt is ESRD on outpt HD.  CM set up appt for pt at [facility] and also provided [facility] number to help assist with locating PCP that accepts his insurance.  Pt will discharge home on Eliquis - CM contacted pharmacy of choice [facility] on [address]    Expected Discharge Date:  11/11/17               Expected Discharge Plan:  Home/Self Care  In-House Referral:     Discharge planning Services  CM Consult, Medication Assistance  Post Acute Care Choice:    Choice offered to:     DME Arranged:    DME Agency:     HH Arranged:    HH Agency:     Status of Service:  Completed, signed off  If discussed at [employer], dates discussed:    Additional Comments:  Maryclare Labrador, RN 11/11/2017, 1:48 PM

## 2017-11-11 NOTE — Progress Notes (Signed)
CSW received consult to assist pt with getting PCP- RNCM assists with obtaining PCP- 2C RNCM informed of pt need  CSW signing off  Jorge Ny, [place] Social Worker [phone]

## 2017-11-11 NOTE — Progress Notes (Signed)
HD tx completed @ 2155 w/o problem, UF goal met, blood rinsed back, VSS, report called to Sylvan Cheese, RN

## 2017-11-11 NOTE — Progress Notes (Signed)
Lt IJ discontinued.  Patient agreed to lay semi flat for 30 minutes.  Very anxious to leave. Attempted to go over discharge instructions with patient.  He refused saying "I don't need to go over anything".  He did accept his discharge instruction packet and prescriptions.  Tilda Burrow [facility] 11/11/2017 1:58 PM

## 2017-11-13 ENCOUNTER — Other Ambulatory Visit: Payer: Self-pay

## 2017-11-13 ENCOUNTER — Encounter (HOSPITAL_COMMUNITY): Payer: Self-pay | Admitting: Emergency Medicine

## 2017-11-13 ENCOUNTER — Emergency Department (HOSPITAL_COMMUNITY)
Admission: EM | Admit: 2017-11-13 | Discharge: 2017-11-14 | Disposition: A | Discharge status: Home / Self Care | Payer: Medicare Other | Attending: Emergency Medicine | Admitting: Emergency Medicine

## 2017-11-13 ENCOUNTER — Emergency Department (HOSPITAL_COMMUNITY): Payer: Medicare Other

## 2017-11-13 DIAGNOSIS — R079 Chest pain, unspecified: Secondary | ICD-10-CM | POA: Diagnosis not present

## 2017-11-13 DIAGNOSIS — I5023 Acute on chronic systolic (congestive) heart failure: Secondary | ICD-10-CM | POA: Diagnosis not present

## 2017-11-13 DIAGNOSIS — Z992 Dependence on renal dialysis: Secondary | ICD-10-CM | POA: Insufficient documentation

## 2017-11-13 DIAGNOSIS — Z7901 Long term (current) use of anticoagulants: Secondary | ICD-10-CM | POA: Insufficient documentation

## 2017-11-13 DIAGNOSIS — Z79899 Other long term (current) drug therapy: Secondary | ICD-10-CM | POA: Insufficient documentation

## 2017-11-13 DIAGNOSIS — Z87891 Personal history of nicotine dependence: Secondary | ICD-10-CM | POA: Insufficient documentation

## 2017-11-13 DIAGNOSIS — N186 End stage renal disease: Secondary | ICD-10-CM | POA: Diagnosis not present

## 2017-11-13 DIAGNOSIS — R0602 Shortness of breath: Secondary | ICD-10-CM | POA: Diagnosis not present

## 2017-11-13 DIAGNOSIS — E1122 Type 2 diabetes mellitus with diabetic chronic kidney disease: Secondary | ICD-10-CM | POA: Diagnosis not present

## 2017-11-13 DIAGNOSIS — I132 Hypertensive heart and chronic kidney disease with heart failure and with stage 5 chronic kidney disease, or end stage renal disease: Secondary | ICD-10-CM | POA: Insufficient documentation

## 2017-11-13 DIAGNOSIS — R0789 Other chest pain: Secondary | ICD-10-CM | POA: Diagnosis not present

## 2017-11-13 LAB — BASIC METABOLIC PANEL
Anion gap: 16 — ABNORMAL HIGH (ref 5–15)
BUN: 49 mg/dL — ABNORMAL HIGH (ref 6–20)
CALCIUM: 8.7 mg/dL — AB (ref 8.9–10.3)
CO2: 20 mmol/L — AB (ref 22–32)
CREATININE: 10.03 mg/dL — AB (ref 0.61–1.24)
Chloride: 102 mmol/L (ref 101–111)
GFR calc Af Amer: 6 mL/min — ABNORMAL LOW (ref >60)
GFR calc non Af Amer: 5 mL/min — ABNORMAL LOW (ref >60)
GLUCOSE: 96 mg/dL (ref 65–99)
Potassium: 4.1 mmol/L (ref 3.5–5.1)
Sodium: 138 mmol/L (ref 135–145)

## 2017-11-13 LAB — CBC
HCT: 24.9 % — ABNORMAL LOW (ref 39.0–52.0)
Hemoglobin: 8 g/dL — ABNORMAL LOW (ref 13.0–17.0)
MCH: 26.7 pg (ref 26.0–34.0)
MCHC: 32.1 g/dL (ref 30.0–36.0)
MCV: 83 fL (ref 78.0–100.0)
Platelets: 216 10*3/uL (ref 150–400)
RBC: 3 MIL/uL — ABNORMAL LOW (ref 4.22–5.81)
RDW: 18.6 % — AB (ref 11.5–15.5)
WBC: 5 10*3/uL (ref 4.0–10.5)

## 2017-11-13 LAB — I-STAT TROPONIN, ED: TROPONIN I, POC: 0.04 ng/mL (ref 0.00–0.08)

## 2017-11-13 NOTE — ED Notes (Signed)
Pt called for vitals, no answer

## 2017-11-13 NOTE — ED Triage Notes (Signed)
Pt presents with c/o chest and left side pain x 1 day. Dialysis M/W/F, denies having dialysis yesterday.

## 2017-11-14 DIAGNOSIS — R0989 Other specified symptoms and signs involving the circulatory and respiratory systems: Secondary | ICD-10-CM | POA: Diagnosis not present

## 2017-11-14 DIAGNOSIS — J189 Pneumonia, unspecified organism: Secondary | ICD-10-CM | POA: Diagnosis not present

## 2017-11-14 DIAGNOSIS — Z5181 Encounter for therapeutic drug level monitoring: Secondary | ICD-10-CM | POA: Diagnosis not present

## 2017-11-14 DIAGNOSIS — I132 Hypertensive heart and chronic kidney disease with heart failure and with stage 5 chronic kidney disease, or end stage renal disease: Secondary | ICD-10-CM | POA: Diagnosis not present

## 2017-11-14 DIAGNOSIS — I509 Heart failure, unspecified: Secondary | ICD-10-CM | POA: Diagnosis not present

## 2017-11-14 DIAGNOSIS — K219 Gastro-esophageal reflux disease without esophagitis: Secondary | ICD-10-CM | POA: Diagnosis not present

## 2017-11-14 DIAGNOSIS — B9562 Methicillin resistant Staphylococcus aureus infection as the cause of diseases classified elsewhere: Secondary | ICD-10-CM | POA: Diagnosis not present

## 2017-11-14 DIAGNOSIS — J90 Pleural effusion, not elsewhere classified: Secondary | ICD-10-CM | POA: Diagnosis not present

## 2017-11-14 DIAGNOSIS — Z86718 Personal history of other venous thrombosis and embolism: Secondary | ICD-10-CM | POA: Diagnosis not present

## 2017-11-14 DIAGNOSIS — N2581 Secondary hyperparathyroidism of renal origin: Secondary | ICD-10-CM | POA: Diagnosis not present

## 2017-11-14 DIAGNOSIS — R0602 Shortness of breath: Secondary | ICD-10-CM | POA: Diagnosis not present

## 2017-11-14 DIAGNOSIS — R05 Cough: Secondary | ICD-10-CM | POA: Diagnosis not present

## 2017-11-14 DIAGNOSIS — R079 Chest pain, unspecified: Secondary | ICD-10-CM | POA: Diagnosis not present

## 2017-11-14 DIAGNOSIS — Z992 Dependence on renal dialysis: Secondary | ICD-10-CM | POA: Diagnosis not present

## 2017-11-14 DIAGNOSIS — D649 Anemia, unspecified: Secondary | ICD-10-CM | POA: Diagnosis not present

## 2017-11-14 DIAGNOSIS — Z86711 Personal history of pulmonary embolism: Secondary | ICD-10-CM | POA: Diagnosis not present

## 2017-11-14 DIAGNOSIS — R0789 Other chest pain: Secondary | ICD-10-CM | POA: Diagnosis not present

## 2017-11-14 DIAGNOSIS — D631 Anemia in chronic kidney disease: Secondary | ICD-10-CM | POA: Diagnosis not present

## 2017-11-14 DIAGNOSIS — N186 End stage renal disease: Secondary | ICD-10-CM | POA: Diagnosis not present

## 2017-11-14 LAB — I-STAT TROPONIN, ED: TROPONIN I, POC: 0.02 ng/mL (ref 0.00–0.08)

## 2017-11-14 MED ORDER — MORPHINE SULFATE (PF) 4 MG/ML IV SOLN
4.0000 mg | Freq: Once | INTRAVENOUS | Status: AC
  Administered 2017-11-14: 4 mg via INTRAVENOUS
  Filled 2017-11-14: qty 1

## 2017-11-14 MED ORDER — MORPHINE SULFATE (PF) 4 MG/ML IV SOLN
2.0000 mg | Freq: Once | INTRAVENOUS | Status: AC
  Administered 2017-11-14: 2 mg via INTRAVENOUS
  Filled 2017-11-14: qty 1

## 2017-11-14 NOTE — ED Notes (Signed)
ED Provider at bedside.

## 2017-11-14 NOTE — Discharge Summary (Signed)
Name: Frank [name] MRN: [id number] DOB: [birth year] 52 y.o. PCP: Patient, [name]  Date of Admission: 11/08/2017 11:18 PM Date of Discharge: 11/11/2017 Attending Physician: Dr. Aldine Contes   Discharge Diagnosis: Principal Problem:   Pleuritic chest pain Active Problems:   Chronic combined systolic and diastolic congestive heart failure (HCC)   Anemia   End stage renal disease on dialysis (HCC)   Pleural effusion   Junctional bradycardia   Discharge Medications: Allergies as of 11/11/2017      Reactions   Butalbital-apap-caffeine Shortness Of Breath, Swelling, Other (See Comments)   Swelling in throat   Ferrlecit [na Ferric Gluc Cplx In Sucrose] Shortness Of Breath, Swelling, Other (See Comments)   Swelling in throat, tolerates Venofor   Minoxidil Shortness Of Breath   Tylenol [acetaminophen] Anaphylaxis, Swelling   Darvocet [propoxyphene N-acetaminophen] Hives      Medication List    STOP taking these medications   amitriptyline 10 MG tablet Commonly known as:  ELAVIL   amLODipine 10 MG tablet Commonly known as:  NORVASC   atorvastatin 10 MG tablet Commonly known as:  LIPITOR   carvedilol 6.25 MG tablet Commonly known as:  COREG   docusate sodium 100 MG capsule Commonly known as:  COLACE   feeding supplement (PRO-STAT SUGAR FREE 64) Liqd   heparin 100-0.45 UNIT/ML-% infusion   hydrALAZINE 100 MG tablet Commonly known as:  APRESOLINE   metoCLOPramide 5 MG tablet Commonly known as:  REGLAN   ondansetron 4 MG disintegrating tablet Commonly known as:  ZOFRAN-ODT   pantoprazole 40 MG tablet Commonly known as:  PROTONIX   polyethylene glycol packet Commonly known as:  MIRALAX / GLYCOLAX     TAKE these medications   ammonium lactate 12 % lotion Commonly known as:  LAC-HYDRIN Apply topically as needed for dry skin.   amoxicillin-clavulanate 500-125 MG tablet Commonly known as:  AUGMENTIN Take 1 tablet (500 mg total) by mouth daily for 3  days.   apixaban 5 MG Tabs tablet Commonly known as:  ELIQUIS Take 1 tablet (5 mg total) by mouth 2 (two) times daily.   cinacalcet 30 MG tablet Commonly known as:  SENSIPAR Take 3 tablets (90 mg total) by mouth daily with supper.   Darbepoetin Alfa 60 MCG/0.3ML Sosy injection Commonly known as:  ARANESP Inject 0.3 mLs (60 mcg total) into the vein every Wednesday with hemodialysis.   diclofenac sodium 1 % Gel Commonly known as:  VOLTAREN Apply 2 g topically 4 (four) times daily as needed.   lisinopril 5 MG tablet Commonly known as:  PRINIVIL,ZESTRIL Take 1 tablet (5 mg total) by mouth daily.   multivitamin Tabs tablet Take 1 tablet by mouth at bedtime.   sevelamer carbonate 800 MG tablet Commonly known as:  RENVELA Take 3 tablets (2,400 mg total) by mouth 3 (three) times daily with meals.       Disposition and follow-up:   Frank [name] was discharged from [facility] in Stable condition.  At the hospital follow up visit please address:  1.  -Encourage adherence to HD and establishment with PCP for appropriate titration of ideal cardiac/htn regimen  -Recommend very conservative use of controlled pain medications given pt demonstration of inappropriate behavior, red flags regarding pain regimen   2.  Labs / imaging needed at time of follow-up: None  3.  Pending labs/ test needing follow-up: None   Follow-up Appointments: Follow-up Information    [facility] up.   Contact information:  Please call [phone] for assistance with locating a primary care doctor within [facility] that accepts your insurance if you decide to not utilize [facility] Follow up on 12/10/2017.   Why:  Appointment for Primary Care Doctor at [place] information: [address], , [place], [phone]          Hospital Course by problem list:   R Pleural Effusion   Patient presented with right-sided chest pain attributed to underlying right pleural effusion noted on CT imaging.  CT also noted potential areas of pneumonia, though these were also adjacent to effusion may represent atelectasis.  He was started on an Augmentin course for empiric treatment (reported prior vomiting so included anaerobic/aspiration coverage). He has had multiple recent admissions for similar pain and has had 2 thoracenteses performed showing a transudate of process, likely related to his heart failure and ESRD history.  Based on chart review, degree of pleural effusion was felt to be stable from prior and would likely improve with better adherence to HD schedule.  As such, repeat thoracentesis was not pursued.  Pain control Beginning in the ED, pain control was an issue.  Documentation from prior hospitalizations noted concern for pain seeking behavior with patient requesting IV Dilaudid and declining various aspects of medical care until receiving Dilaudid.  In the past, he has been given short-term Dilaudid prescriptions on discharge with instructions to taper.  Database review showed last prescription was filled on 4/8 for a small supply of Dilaudid.  Over the past year, he had 25 different prescribers with 8 different pharmacies used for various narcotic pain prescriptions.  This admission, he again declined testing and several interventions until receiving an IV agent for pain control while declining other adjunctive treatments.  Based on limited amount of opiates received during this admission, he was not discharged on any opiates and was not exhibiting signs of withdrawal. Prescribed voltaren gel. Would recommend caution and careful consideration of appropriateness of opiate therapy in the future.  Bradycardia with Suspected Beta Blocker Toxicity Based on prior admission discharge summary information and patient report, his beta-blocker was resumed on admission at 25 mg dose.  The  morning following this dose, the patient became bradycardic to 30s.  IV access was lost at the time, he received IM atropine doses until a central line was placed (multiple failed attempts at reestablishing peripheral IV access) for a glucagon infusion and calcium gluconate.  His heart rate improved and stabilized following dialysis.  With symptomatic improvement, he quickly declined continuation of on and subsequent telemetry monitoring.  ESRD on MWF HD He reported no missed HD sessions but his last session prior to admission being stopped short due to complaints of pain.  Further chart review by nephrology noted he had actually had multiple sessions cut short and limited outpatient treatments intended (though patient has been sporadically admitted at multiple hospitals throughout the month).  During episode of bradycardia, labs were unable to be obtained due to the difficulty with IV access.  An EKG during the episode noted peaked T waves and calcium administered empirically.  He underwent dialysis soon after with labs showing K greater than 7.5 which was corrected with HD and was 4.3.  Improved adherence to outpatient dialysis was strongly encouraged.  H/o Combined HF, HTN Last EF 01-60% with diastolic dysfunction and right-sided failure.  Based on pharmacy medication reconciliation, the patient was li unlikely to be  taking his medications based on last fill dates.  Given the above events with his beta-blocker dose, it was felt there were significant short-term risks of hypotension, bradycardia and low chances of adherence with resuming all aspects of his prior regimen.  Instead, he was discharged on low-dose lisinopril and an appointment to establish a PCP was established.  Hypertensive regimen may also be able to be titrated by nephrology.  H/o Renal Mass He has had a 4 cm renal mass noted on prior imaging.  He was referred to urology at [facility] and had also been seen by [facility] in the past.   Currently pending further work-up with urology at [facility]  H/o DVT/PE Currently on Eliquis which was a recent change after switching from Coumadin due to poor adherence.  Prescribed on discharge given documentation of need for indefinite anticoagulation.  H/o Pancreatic Pseudocyst Recently admitted and managed at [facility] in early April for infected pseudocyst with stenting.  Recommend continued follow-up with gastroenterology at [facility]   Discharge Vitals:   BP 126/85 (BP Location: Right Arm)   Pulse 86   Temp 98 F (36.7 C) (Oral)   Resp 16   Ht _0  (1.702 m)   Wt 160 lb 7.9 oz (72.8 kg)   SpO2 92%   BMI 25.14 kg/m   Pertinent Labs, Studies, and Procedures:  BMP Latest Ref Rng & Units 11/13/2017 11/11/2017 11/10/2017  Glucose 65 - 99 mg/dL 96 105(H) 44(LL)  BUN 6 - 20 mg/dL 49(H) 22(H) 60(H)  Creatinine 0.61 - 1.24 mg/dL 10.03(H) 5.48(H) 10.47(H)  Sodium 135 - 145 mmol/L 138 136 135  Potassium 3.5 - 5.1 mmol/L 4.1 4.3 >7.5(HH)  Chloride 101 - 111 mmol/L 102 98(L) 97(L)  CO2 22 - 32 mmol/L 20(L) 27 19(L)  Calcium 8.9 - 10.3 mg/dL 8.7(L) 8.8(L) 9.4     Discharge Instructions: Discharge Instructions    Discharge instructions   Complete by:  As directed    Nice to meet you Frank [name] -Be sure to continue going to dialysis sessions. This should help continue to improve the fluid around your lungs and keep your potassium from getting high  -Do not take coreg right now. We've written prescriptions for a blood pressure medicine called lisinopril that also helps your heart and a blood thinner that you were recently switched to. A new primary care doctor can help add back important medications depending on your heart and blood pressure over time -For your pain, we prescribed a gel with an anti-inflammatory medication in it. The pain should also continue to improve with keeping the fluid down with dialysis -We also prescribed an antibiotic to cover any infection underlying the fluid  -Make an  appointment with a primary care doctor to help coordinate and oversee your health issues. Also continue to keep your appointments with the stomach doctors from [place] for your pancreas      Signed: Tawny Asal, MD 11/14/2017, 3:28 PM   Pager: [phone]

## 2017-11-14 NOTE — ED Provider Notes (Signed)
Patient seen/examined in the Emergency Department in conjunction with Midlevel Provider [place] Patient reports chest pain.  He thinks it is due to his pleural effusions.  Describes more rib pain at this time Exam : Resting comfortably, no acute distress, diffuse chest wall tenderness Plan: No significant hypoxia, no new changes on chest x-ray, no hyperkalemia.  I feel he will be safe for discharge And dialysis tomorrow   Ripley Fraise, MD 11/14/17 [phone]
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
## 2017-11-14 NOTE — ED Notes (Addendum)
ED Provider at bedside.

## 2017-11-14 NOTE — Discharge Instructions (Addendum)
Please go to dialysis for your scheduled dialysis today.  Return to the ED with any worsening symptoms.

## 2017-11-14 NOTE — ED Notes (Signed)
Patient inquiring how many people in front of him, pt made aware that he still has a few people ahead of him, pt stated "there were just 2 people ahead of me last time I asked, Frank [name] been here 8 hours, yall can't keep letting people go back in front of me." this RN explained process of how we take people back by acuity and that we are working to get him back as soon as possible.

## 2017-11-15 DIAGNOSIS — R0602 Shortness of breath: Secondary | ICD-10-CM | POA: Diagnosis not present

## 2017-11-15 DIAGNOSIS — N186 End stage renal disease: Secondary | ICD-10-CM | POA: Diagnosis not present

## 2017-11-15 DIAGNOSIS — I10 Essential (primary) hypertension: Secondary | ICD-10-CM | POA: Diagnosis not present

## 2017-11-15 DIAGNOSIS — N2889 Other specified disorders of kidney and ureter: Secondary | ICD-10-CM | POA: Diagnosis not present

## 2017-11-15 DIAGNOSIS — Z86718 Personal history of other venous thrombosis and embolism: Secondary | ICD-10-CM | POA: Diagnosis not present

## 2017-11-15 DIAGNOSIS — Z888 Allergy status to other drugs, medicaments and biological substances status: Secondary | ICD-10-CM | POA: Diagnosis not present

## 2017-11-15 DIAGNOSIS — R109 Unspecified abdominal pain: Secondary | ICD-10-CM | POA: Diagnosis not present

## 2017-11-15 DIAGNOSIS — I509 Heart failure, unspecified: Secondary | ICD-10-CM | POA: Diagnosis not present

## 2017-11-15 DIAGNOSIS — Z886 Allergy status to analgesic agent status: Secondary | ICD-10-CM | POA: Diagnosis not present

## 2017-11-15 DIAGNOSIS — R1012 Left upper quadrant pain: Secondary | ICD-10-CM | POA: Diagnosis not present

## 2017-11-15 DIAGNOSIS — R079 Chest pain, unspecified: Secondary | ICD-10-CM | POA: Diagnosis not present

## 2017-11-15 DIAGNOSIS — R918 Other nonspecific abnormal finding of lung field: Secondary | ICD-10-CM | POA: Diagnosis not present

## 2017-11-15 DIAGNOSIS — Z931 Gastrostomy status: Secondary | ICD-10-CM | POA: Diagnosis not present

## 2017-11-15 DIAGNOSIS — Z79899 Other long term (current) drug therapy: Secondary | ICD-10-CM | POA: Diagnosis not present

## 2017-11-15 DIAGNOSIS — D631 Anemia in chronic kidney disease: Secondary | ICD-10-CM | POA: Diagnosis not present

## 2017-11-15 DIAGNOSIS — R748 Abnormal levels of other serum enzymes: Secondary | ICD-10-CM | POA: Diagnosis not present

## 2017-11-15 DIAGNOSIS — Z86711 Personal history of pulmonary embolism: Secondary | ICD-10-CM | POA: Diagnosis not present

## 2017-11-15 DIAGNOSIS — J984 Other disorders of lung: Secondary | ICD-10-CM | POA: Diagnosis not present

## 2017-11-15 DIAGNOSIS — G4733 Obstructive sleep apnea (adult) (pediatric): Secondary | ICD-10-CM | POA: Diagnosis not present

## 2017-11-15 DIAGNOSIS — I132 Hypertensive heart and chronic kidney disease with heart failure and with stage 5 chronic kidney disease, or end stage renal disease: Secondary | ICD-10-CM | POA: Diagnosis not present

## 2017-11-15 DIAGNOSIS — R0989 Other specified symptoms and signs involving the circulatory and respiratory systems: Secondary | ICD-10-CM | POA: Diagnosis not present

## 2017-11-15 DIAGNOSIS — T8612 Kidney transplant failure: Secondary | ICD-10-CM | POA: Diagnosis not present

## 2017-11-15 DIAGNOSIS — I5042 Chronic combined systolic (congestive) and diastolic (congestive) heart failure: Secondary | ICD-10-CM | POA: Diagnosis not present

## 2017-11-15 DIAGNOSIS — Z992 Dependence on renal dialysis: Secondary | ICD-10-CM | POA: Diagnosis not present

## 2017-11-15 DIAGNOSIS — J90 Pleural effusion, not elsewhere classified: Secondary | ICD-10-CM | POA: Diagnosis not present

## 2017-11-16 ENCOUNTER — Encounter (HOSPITAL_COMMUNITY): Payer: Self-pay | Admitting: *Deleted

## 2017-11-16 ENCOUNTER — Emergency Department (HOSPITAL_COMMUNITY)
Admission: EM | Admit: 2017-11-16 | Discharge: 2017-11-17 | Disposition: A | Discharge status: Home / Self Care | Payer: Medicare Other | Attending: Emergency Medicine | Admitting: Emergency Medicine

## 2017-11-16 ENCOUNTER — Emergency Department (HOSPITAL_COMMUNITY): Payer: Medicare Other

## 2017-11-16 ENCOUNTER — Other Ambulatory Visit: Payer: Self-pay

## 2017-11-16 DIAGNOSIS — Z7901 Long term (current) use of anticoagulants: Secondary | ICD-10-CM | POA: Insufficient documentation

## 2017-11-16 DIAGNOSIS — N186 End stage renal disease: Secondary | ICD-10-CM | POA: Diagnosis not present

## 2017-11-16 DIAGNOSIS — E1122 Type 2 diabetes mellitus with diabetic chronic kidney disease: Secondary | ICD-10-CM | POA: Diagnosis not present

## 2017-11-16 DIAGNOSIS — Z992 Dependence on renal dialysis: Secondary | ICD-10-CM | POA: Insufficient documentation

## 2017-11-16 DIAGNOSIS — Z79899 Other long term (current) drug therapy: Secondary | ICD-10-CM | POA: Diagnosis not present

## 2017-11-16 DIAGNOSIS — I132 Hypertensive heart and chronic kidney disease with heart failure and with stage 5 chronic kidney disease, or end stage renal disease: Secondary | ICD-10-CM | POA: Diagnosis not present

## 2017-11-16 DIAGNOSIS — Z87891 Personal history of nicotine dependence: Secondary | ICD-10-CM | POA: Insufficient documentation

## 2017-11-16 DIAGNOSIS — R1084 Generalized abdominal pain: Secondary | ICD-10-CM | POA: Insufficient documentation

## 2017-11-16 DIAGNOSIS — R072 Precordial pain: Secondary | ICD-10-CM | POA: Insufficient documentation

## 2017-11-16 DIAGNOSIS — I120 Hypertensive chronic kidney disease with stage 5 chronic kidney disease or end stage renal disease: Secondary | ICD-10-CM | POA: Diagnosis not present

## 2017-11-16 DIAGNOSIS — I5042 Chronic combined systolic (congestive) and diastolic (congestive) heart failure: Secondary | ICD-10-CM | POA: Insufficient documentation

## 2017-11-16 DIAGNOSIS — R079 Chest pain, unspecified: Secondary | ICD-10-CM | POA: Diagnosis not present

## 2017-11-16 DIAGNOSIS — Z940 Kidney transplant status: Secondary | ICD-10-CM | POA: Diagnosis not present

## 2017-11-16 DIAGNOSIS — R0602 Shortness of breath: Secondary | ICD-10-CM | POA: Diagnosis not present

## 2017-11-16 LAB — BASIC METABOLIC PANEL
Anion gap: 18 — ABNORMAL HIGH (ref 5–15)
BUN: 55 mg/dL — AB (ref 6–20)
CALCIUM: 9.1 mg/dL (ref 8.9–10.3)
CHLORIDE: 96 mmol/L — AB (ref 101–111)
CO2: 22 mmol/L (ref 22–32)
CREATININE: 9.47 mg/dL — AB (ref 0.61–1.24)
GFR calc Af Amer: 6 mL/min — ABNORMAL LOW (ref >60)
GFR calc non Af Amer: 6 mL/min — ABNORMAL LOW (ref >60)
Glucose, Bld: 85 mg/dL (ref 65–99)
Potassium: 5.3 mmol/L — ABNORMAL HIGH (ref 3.5–5.1)
Sodium: 136 mmol/L (ref 135–145)

## 2017-11-16 LAB — CBC
HCT: 24.2 % — ABNORMAL LOW (ref 39.0–52.0)
Hemoglobin: 7.6 g/dL — ABNORMAL LOW (ref 13.0–17.0)
MCH: 25.9 pg — AB (ref 26.0–34.0)
MCHC: 31.4 g/dL (ref 30.0–36.0)
MCV: 82.6 fL (ref 78.0–100.0)
PLATELETS: 210 10*3/uL (ref 150–400)
RBC: 2.93 MIL/uL — ABNORMAL LOW (ref 4.22–5.81)
RDW: 19.1 % — AB (ref 11.5–15.5)
WBC: 4.8 10*3/uL (ref 4.0–10.5)

## 2017-11-16 LAB — I-STAT TROPONIN, ED: Troponin i, poc: 0.02 ng/mL (ref 0.00–0.08)

## 2017-11-16 MED ORDER — AMLODIPINE BESYLATE 10 MG PO TABS
10.00 | ORAL_TABLET | ORAL | Status: DC
Start: ? — End: 2017-11-16

## 2017-11-16 MED ORDER — CARVEDILOL 25 MG PO TABS
25.00 | ORAL_TABLET | ORAL | Status: DC
Start: ? — End: 2017-11-16

## 2017-11-16 MED ORDER — LISINOPRIL 20 MG PO TABS
40.00 | ORAL_TABLET | ORAL | Status: DC
Start: 2017-11-16 — End: 2017-11-16

## 2017-11-16 MED ORDER — ISOSORBIDE MONONITRATE ER 30 MG PO TB24
60.00 | ORAL_TABLET | ORAL | Status: DC
Start: 2017-11-16 — End: 2017-11-16

## 2017-11-16 MED ORDER — HYDRALAZINE HCL 25 MG PO TABS
100.00 | ORAL_TABLET | ORAL | Status: DC
Start: 2017-11-15 — End: 2017-11-16

## 2017-11-16 NOTE — ED Triage Notes (Signed)
Pt c/o chest pain onset yesterday with SOB. Is a MWF dialysis pt, last treatment on friday

## 2017-11-17 DIAGNOSIS — N2581 Secondary hyperparathyroidism of renal origin: Secondary | ICD-10-CM | POA: Diagnosis not present

## 2017-11-17 DIAGNOSIS — N186 End stage renal disease: Secondary | ICD-10-CM | POA: Diagnosis not present

## 2017-11-17 DIAGNOSIS — D631 Anemia in chronic kidney disease: Secondary | ICD-10-CM | POA: Diagnosis not present

## 2017-11-17 DIAGNOSIS — B9562 Methicillin resistant Staphylococcus aureus infection as the cause of diseases classified elsewhere: Secondary | ICD-10-CM | POA: Diagnosis not present

## 2017-11-17 DIAGNOSIS — Z992 Dependence on renal dialysis: Secondary | ICD-10-CM | POA: Diagnosis not present

## 2017-11-17 LAB — HEPATIC FUNCTION PANEL
ALT: 79 U/L — ABNORMAL HIGH (ref 17–63)
AST: 40 U/L (ref 15–41)
Albumin: 2.9 g/dL — ABNORMAL LOW (ref 3.5–5.0)
Alkaline Phosphatase: 157 U/L — ABNORMAL HIGH (ref 38–126)
BILIRUBIN DIRECT: 0.2 mg/dL (ref 0.1–0.5)
BILIRUBIN INDIRECT: 0.6 mg/dL (ref 0.3–0.9)
BILIRUBIN TOTAL: 0.8 mg/dL (ref 0.3–1.2)
Total Protein: 8.2 g/dL — ABNORMAL HIGH (ref 6.5–8.1)

## 2017-11-17 LAB — LIPASE, BLOOD: Lipase: 57 U/L — ABNORMAL HIGH (ref 11–51)

## 2017-11-17 MED ORDER — OXYCODONE HCL 5 MG PO TABS: 5.0000 mg | ORAL_TABLET | Freq: Four times a day (QID) | ORAL | 0 refills | Status: DC | PRN

## 2017-11-17 MED ORDER — PROMETHAZINE HCL 25 MG/ML IJ SOLN
12.5000 mg | Freq: Once | INTRAMUSCULAR | Status: AC
  Administered 2017-11-17: 12.5 mg via INTRAVENOUS
  Filled 2017-11-17: qty 1

## 2017-11-17 MED ORDER — OXYCODONE HCL 5 MG PO TABS
10.0000 mg | ORAL_TABLET | Freq: Once | ORAL | Status: AC
  Administered 2017-11-17: 10 mg via ORAL
  Filled 2017-11-17: qty 2

## 2017-11-17 MED ORDER — MORPHINE SULFATE (PF) 4 MG/ML IV SOLN
4.0000 mg | Freq: Once | INTRAVENOUS | Status: AC
  Administered 2017-11-17: 4 mg via INTRAVENOUS
  Filled 2017-11-17: qty 1
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
## 2017-11-17 NOTE — ED Notes (Signed)
Spoke w/ lab who sts they would add on lipase and hepatic function to previously collected blood work

## 2017-11-17 NOTE — Discharge Instructions (Signed)
SEEK IMMEDIATE MEDICAL ATTENTION IF: A temperature above 100.75F develops.  Repeated vomiting occurs (multiple episodes).  The pain becomes localized to portions of the abdomen.  Blood is being passed in stools or vomit (bright red or black tarry stools).  Return also if you develop chest pain, difficulty breathing, dizziness or fainting, or become confused, poorly responsive, or inconsolable.

## 2017-11-19 DIAGNOSIS — Z992 Dependence on renal dialysis: Secondary | ICD-10-CM | POA: Diagnosis not present

## 2017-11-19 DIAGNOSIS — N2581 Secondary hyperparathyroidism of renal origin: Secondary | ICD-10-CM | POA: Diagnosis not present

## 2017-11-19 DIAGNOSIS — D509 Iron deficiency anemia, unspecified: Secondary | ICD-10-CM | POA: Diagnosis not present

## 2017-11-19 DIAGNOSIS — Z5181 Encounter for therapeutic drug level monitoring: Secondary | ICD-10-CM | POA: Diagnosis not present

## 2017-11-19 DIAGNOSIS — N186 End stage renal disease: Secondary | ICD-10-CM | POA: Diagnosis not present

## 2017-11-19 DIAGNOSIS — D631 Anemia in chronic kidney disease: Secondary | ICD-10-CM | POA: Diagnosis not present

## 2017-11-19 DIAGNOSIS — T8610 Unspecified complication of kidney transplant: Secondary | ICD-10-CM | POA: Diagnosis not present

## 2017-11-19 DIAGNOSIS — B9562 Methicillin resistant Staphylococcus aureus infection as the cause of diseases classified elsewhere: Secondary | ICD-10-CM | POA: Diagnosis not present

## 2017-11-21 DIAGNOSIS — D631 Anemia in chronic kidney disease: Secondary | ICD-10-CM | POA: Diagnosis not present

## 2017-11-21 DIAGNOSIS — B9562 Methicillin resistant Staphylococcus aureus infection as the cause of diseases classified elsewhere: Secondary | ICD-10-CM | POA: Diagnosis not present

## 2017-11-21 DIAGNOSIS — Z992 Dependence on renal dialysis: Secondary | ICD-10-CM | POA: Diagnosis not present

## 2017-11-21 DIAGNOSIS — N2581 Secondary hyperparathyroidism of renal origin: Secondary | ICD-10-CM | POA: Diagnosis not present

## 2017-11-21 DIAGNOSIS — N186 End stage renal disease: Secondary | ICD-10-CM | POA: Diagnosis not present

## 2017-11-21 DIAGNOSIS — D509 Iron deficiency anemia, unspecified: Secondary | ICD-10-CM | POA: Diagnosis not present

## 2017-11-24 DIAGNOSIS — N186 End stage renal disease: Secondary | ICD-10-CM | POA: Diagnosis not present

## 2017-11-24 DIAGNOSIS — N2581 Secondary hyperparathyroidism of renal origin: Secondary | ICD-10-CM | POA: Diagnosis not present

## 2017-11-24 DIAGNOSIS — D509 Iron deficiency anemia, unspecified: Secondary | ICD-10-CM | POA: Diagnosis not present

## 2017-11-24 DIAGNOSIS — B9562 Methicillin resistant Staphylococcus aureus infection as the cause of diseases classified elsewhere: Secondary | ICD-10-CM | POA: Diagnosis not present

## 2017-11-24 DIAGNOSIS — D631 Anemia in chronic kidney disease: Secondary | ICD-10-CM | POA: Diagnosis not present

## 2017-11-24 DIAGNOSIS — Z992 Dependence on renal dialysis: Secondary | ICD-10-CM | POA: Diagnosis not present

## 2017-11-27 DIAGNOSIS — D631 Anemia in chronic kidney disease: Secondary | ICD-10-CM | POA: Diagnosis not present

## 2017-11-27 DIAGNOSIS — N2581 Secondary hyperparathyroidism of renal origin: Secondary | ICD-10-CM | POA: Diagnosis not present

## 2017-11-27 DIAGNOSIS — Z992 Dependence on renal dialysis: Secondary | ICD-10-CM | POA: Diagnosis not present

## 2017-11-27 DIAGNOSIS — N186 End stage renal disease: Secondary | ICD-10-CM | POA: Diagnosis not present

## 2017-11-27 DIAGNOSIS — D509 Iron deficiency anemia, unspecified: Secondary | ICD-10-CM | POA: Diagnosis not present

## 2017-11-27 DIAGNOSIS — Z5181 Encounter for therapeutic drug level monitoring: Secondary | ICD-10-CM | POA: Diagnosis not present

## 2017-11-27 DIAGNOSIS — B9562 Methicillin resistant Staphylococcus aureus infection as the cause of diseases classified elsewhere: Secondary | ICD-10-CM | POA: Diagnosis not present

## 2017-11-28 DIAGNOSIS — N2581 Secondary hyperparathyroidism of renal origin: Secondary | ICD-10-CM | POA: Diagnosis not present

## 2017-11-28 DIAGNOSIS — B9562 Methicillin resistant Staphylococcus aureus infection as the cause of diseases classified elsewhere: Secondary | ICD-10-CM | POA: Diagnosis not present

## 2017-11-28 DIAGNOSIS — D631 Anemia in chronic kidney disease: Secondary | ICD-10-CM | POA: Diagnosis not present

## 2017-11-28 DIAGNOSIS — D509 Iron deficiency anemia, unspecified: Secondary | ICD-10-CM | POA: Diagnosis not present

## 2017-11-28 DIAGNOSIS — N186 End stage renal disease: Secondary | ICD-10-CM | POA: Diagnosis not present

## 2017-11-28 DIAGNOSIS — Z992 Dependence on renal dialysis: Secondary | ICD-10-CM | POA: Diagnosis not present

## 2017-11-29 DIAGNOSIS — R140 Abdominal distension (gaseous): Secondary | ICD-10-CM | POA: Diagnosis not present

## 2017-11-29 DIAGNOSIS — R197 Diarrhea, unspecified: Secondary | ICD-10-CM | POA: Diagnosis not present

## 2017-11-29 DIAGNOSIS — R112 Nausea with vomiting, unspecified: Secondary | ICD-10-CM | POA: Diagnosis not present

## 2017-11-29 DIAGNOSIS — N289 Disorder of kidney and ureter, unspecified: Secondary | ICD-10-CM | POA: Diagnosis not present

## 2017-11-29 DIAGNOSIS — R000 Tachycardia, unspecified: Secondary | ICD-10-CM | POA: Diagnosis not present

## 2017-11-29 DIAGNOSIS — R5383 Other fatigue: Secondary | ICD-10-CM | POA: Diagnosis not present

## 2017-11-29 DIAGNOSIS — R1084 Generalized abdominal pain: Secondary | ICD-10-CM | POA: Diagnosis not present

## 2017-12-01 ENCOUNTER — Inpatient Hospital Stay (HOSPITAL_COMMUNITY)
Admission: EM | Admit: 2017-12-01 | Discharge: 2017-12-07 | DRG: 438 | Disposition: A | Discharge status: Home / Self Care | Payer: Medicare Other | Attending: Student in an Organized Health Care Education/Training Program | Admitting: Student in an Organized Health Care Education/Training Program

## 2017-12-01 ENCOUNTER — Emergency Department (HOSPITAL_COMMUNITY): Payer: Medicare Other

## 2017-12-01 ENCOUNTER — Other Ambulatory Visit: Payer: Self-pay

## 2017-12-01 ENCOUNTER — Encounter (HOSPITAL_COMMUNITY): Payer: Self-pay | Admitting: Emergency Medicine

## 2017-12-01 DIAGNOSIS — E8770 Fluid overload, unspecified: Secondary | ICD-10-CM

## 2017-12-01 DIAGNOSIS — R112 Nausea with vomiting, unspecified: Secondary | ICD-10-CM | POA: Diagnosis not present

## 2017-12-01 DIAGNOSIS — I429 Cardiomyopathy, unspecified: Secondary | ICD-10-CM | POA: Diagnosis present

## 2017-12-01 DIAGNOSIS — J9811 Atelectasis: Secondary | ICD-10-CM | POA: Diagnosis present

## 2017-12-01 DIAGNOSIS — K861 Other chronic pancreatitis: Secondary | ICD-10-CM

## 2017-12-01 DIAGNOSIS — N2581 Secondary hyperparathyroidism of renal origin: Secondary | ICD-10-CM | POA: Diagnosis present

## 2017-12-01 DIAGNOSIS — E875 Hyperkalemia: Secondary | ICD-10-CM | POA: Diagnosis present

## 2017-12-01 DIAGNOSIS — I5042 Chronic combined systolic (congestive) and diastolic (congestive) heart failure: Secondary | ICD-10-CM | POA: Diagnosis present

## 2017-12-01 DIAGNOSIS — I132 Hypertensive heart and chronic kidney disease with heart failure and with stage 5 chronic kidney disease, or end stage renal disease: Secondary | ICD-10-CM | POA: Diagnosis not present

## 2017-12-01 DIAGNOSIS — N2889 Other specified disorders of kidney and ureter: Secondary | ICD-10-CM | POA: Diagnosis present

## 2017-12-01 DIAGNOSIS — E860 Dehydration: Secondary | ICD-10-CM | POA: Diagnosis not present

## 2017-12-01 DIAGNOSIS — I313 Pericardial effusion (noninflammatory): Secondary | ICD-10-CM | POA: Diagnosis present

## 2017-12-01 DIAGNOSIS — Z885 Allergy status to narcotic agent status: Secondary | ICD-10-CM

## 2017-12-01 DIAGNOSIS — Z87891 Personal history of nicotine dependence: Secondary | ICD-10-CM

## 2017-12-01 DIAGNOSIS — R0602 Shortness of breath: Secondary | ICD-10-CM | POA: Diagnosis not present

## 2017-12-01 DIAGNOSIS — R1011 Right upper quadrant pain: Secondary | ICD-10-CM

## 2017-12-01 DIAGNOSIS — Z9115 Patient's noncompliance with renal dialysis: Secondary | ICD-10-CM

## 2017-12-01 DIAGNOSIS — N186 End stage renal disease: Secondary | ICD-10-CM | POA: Diagnosis not present

## 2017-12-01 DIAGNOSIS — Z886 Allergy status to analgesic agent status: Secondary | ICD-10-CM

## 2017-12-01 DIAGNOSIS — I509 Heart failure, unspecified: Secondary | ICD-10-CM

## 2017-12-01 DIAGNOSIS — E8889 Other specified metabolic disorders: Secondary | ICD-10-CM | POA: Diagnosis present

## 2017-12-01 DIAGNOSIS — N289 Disorder of kidney and ureter, unspecified: Secondary | ICD-10-CM

## 2017-12-01 DIAGNOSIS — F419 Anxiety disorder, unspecified: Secondary | ICD-10-CM | POA: Diagnosis present

## 2017-12-01 DIAGNOSIS — Z86711 Personal history of pulmonary embolism: Secondary | ICD-10-CM

## 2017-12-01 DIAGNOSIS — N185 Chronic kidney disease, stage 5: Secondary | ICD-10-CM

## 2017-12-01 DIAGNOSIS — E1122 Type 2 diabetes mellitus with diabetic chronic kidney disease: Secondary | ICD-10-CM | POA: Diagnosis present

## 2017-12-01 DIAGNOSIS — D638 Anemia in other chronic diseases classified elsewhere: Secondary | ICD-10-CM | POA: Diagnosis present

## 2017-12-01 DIAGNOSIS — Z888 Allergy status to other drugs, medicaments and biological substances status: Secondary | ICD-10-CM

## 2017-12-01 DIAGNOSIS — K859 Acute pancreatitis without necrosis or infection, unspecified: Secondary | ICD-10-CM | POA: Diagnosis not present

## 2017-12-01 DIAGNOSIS — I071 Rheumatic tricuspid insufficiency: Secondary | ICD-10-CM | POA: Diagnosis present

## 2017-12-01 DIAGNOSIS — Z992 Dependence on renal dialysis: Secondary | ICD-10-CM

## 2017-12-01 DIAGNOSIS — R109 Unspecified abdominal pain: Secondary | ICD-10-CM | POA: Diagnosis not present

## 2017-12-01 DIAGNOSIS — Z8249 Family history of ischemic heart disease and other diseases of the circulatory system: Secondary | ICD-10-CM

## 2017-12-01 DIAGNOSIS — K219 Gastro-esophageal reflux disease without esophagitis: Secondary | ICD-10-CM | POA: Diagnosis present

## 2017-12-01 DIAGNOSIS — F329 Major depressive disorder, single episode, unspecified: Secondary | ICD-10-CM | POA: Diagnosis present

## 2017-12-01 DIAGNOSIS — T182XXA Foreign body in stomach, initial encounter: Secondary | ICD-10-CM | POA: Diagnosis present

## 2017-12-01 DIAGNOSIS — E11649 Type 2 diabetes mellitus with hypoglycemia without coma: Secondary | ICD-10-CM | POA: Diagnosis present

## 2017-12-01 DIAGNOSIS — Z765 Malingerer [conscious simulation]: Secondary | ICD-10-CM

## 2017-12-01 DIAGNOSIS — I110 Hypertensive heart disease with heart failure: Secondary | ICD-10-CM | POA: Diagnosis not present

## 2017-12-01 DIAGNOSIS — Z86718 Personal history of other venous thrombosis and embolism: Secondary | ICD-10-CM

## 2017-12-01 DIAGNOSIS — I5020 Unspecified systolic (congestive) heart failure: Secondary | ICD-10-CM

## 2017-12-01 DIAGNOSIS — Z7901 Long term (current) use of anticoagulants: Secondary | ICD-10-CM

## 2017-12-01 DIAGNOSIS — I700 Atherosclerosis of aorta: Secondary | ICD-10-CM | POA: Diagnosis present

## 2017-12-01 HISTORY — DX: Right upper quadrant pain: R10.11

## 2017-12-01 LAB — COMPREHENSIVE METABOLIC PANEL
ALBUMIN: 2.9 g/dL — AB (ref 3.5–5.0)
ALT: 19 U/L (ref 17–63)
AST: 38 U/L (ref 15–41)
Alkaline Phosphatase: 127 U/L — ABNORMAL HIGH (ref 38–126)
Anion gap: 15 (ref 5–15)
BUN: 62 mg/dL — AB (ref 6–20)
CO2: 24 mmol/L (ref 22–32)
CREATININE: 9.4 mg/dL — AB (ref 0.61–1.24)
Calcium: 9.9 mg/dL (ref 8.9–10.3)
Chloride: 98 mmol/L — ABNORMAL LOW (ref 101–111)
GFR calc Af Amer: 6 mL/min — ABNORMAL LOW (ref >60)
GFR, EST NON AFRICAN AMERICAN: 6 mL/min — AB (ref >60)
Glucose, Bld: 71 mg/dL (ref 65–99)
POTASSIUM: 6.6 mmol/L — AB (ref 3.5–5.1)
Sodium: 137 mmol/L (ref 135–145)
Total Bilirubin: 1.5 mg/dL — ABNORMAL HIGH (ref 0.3–1.2)
Total Protein: 8.5 g/dL — ABNORMAL HIGH (ref 6.5–8.1)

## 2017-12-01 LAB — I-STAT TROPONIN, ED: Troponin i, poc: 0.04 ng/mL (ref 0.00–0.08)

## 2017-12-01 LAB — CBC
HEMATOCRIT: 22.2 % — AB (ref 39.0–52.0)
Hemoglobin: 7.2 g/dL — ABNORMAL LOW (ref 13.0–17.0)
MCH: 27.2 pg (ref 26.0–34.0)
MCHC: 32.4 g/dL (ref 30.0–36.0)
MCV: 83.8 fL (ref 78.0–100.0)
Platelets: 245 10*3/uL (ref 150–400)
RBC: 2.65 MIL/uL — ABNORMAL LOW (ref 4.22–5.81)
RDW: 19.9 % — AB (ref 11.5–15.5)
WBC: 5.8 10*3/uL (ref 4.0–10.5)

## 2017-12-01 LAB — PREPARE RBC (CROSSMATCH)

## 2017-12-01 LAB — BRAIN NATRIURETIC PEPTIDE: B Natriuretic Peptide: >4500 pg/mL — ABNORMAL HIGH (ref 0.0–100.0)

## 2017-12-01 LAB — LIPASE, BLOOD: LIPASE: 85 U/L — AB (ref 11–51)

## 2017-12-01 MED ORDER — OXYCODONE HCL 5 MG PO TABS: 5.0000 mg | ORAL_TABLET | Freq: Four times a day (QID) | ORAL | Status: DC | PRN

## 2017-12-01 MED ORDER — IOPAMIDOL (ISOVUE-300) INJECTION 61%: 100.0000 mL | Freq: Once | INTRAVENOUS | Status: DC | PRN

## 2017-12-01 MED ORDER — SODIUM CHLORIDE 0.9 % IV BOLUS
500.0000 mL | Freq: Once | INTRAVENOUS | Status: AC
  Administered 2017-12-01: 500 mL via INTRAVENOUS

## 2017-12-01 MED ORDER — LISINOPRIL 5 MG PO TABS
5.0000 mg | ORAL_TABLET | Freq: Every day | ORAL | Status: DC
  Administered 2017-12-01 – 2017-12-05 (×5): 5 mg via ORAL
  Filled 2017-12-01 (×6): qty 1

## 2017-12-01 MED ORDER — OXYCODONE HCL 5 MG PO TABS
5.0000 mg | ORAL_TABLET | ORAL | Status: DC | PRN
  Administered 2017-12-01 – 2017-12-02 (×2): 5 mg via ORAL
  Filled 2017-12-01 (×4): qty 1

## 2017-12-01 MED ORDER — SODIUM CHLORIDE 0.9 % IV SOLN: Freq: Once | INTRAVENOUS | Status: DC

## 2017-12-01 MED ORDER — DARBEPOETIN ALFA 200 MCG/0.4ML IJ SOSY
200.0000 ug | PREFILLED_SYRINGE | INTRAMUSCULAR | Status: DC
  Administered 2017-12-01: 200 ug via INTRAVENOUS

## 2017-12-01 MED ORDER — CINACALCET HCL 30 MG PO TABS
90.0000 mg | ORAL_TABLET | Freq: Every day | ORAL | Status: DC
  Administered 2017-12-01 – 2017-12-05 (×4): 90 mg via ORAL
  Filled 2017-12-01 (×4): qty 3

## 2017-12-01 MED ORDER — SEVELAMER CARBONATE 800 MG PO TABS
2400.0000 mg | ORAL_TABLET | Freq: Three times a day (TID) | ORAL | Status: DC
  Administered 2017-12-02 – 2017-12-07 (×7): 2400 mg via ORAL
  Filled 2017-12-01 (×11): qty 3

## 2017-12-01 MED ORDER — DOXERCALCIFEROL 4 MCG/2ML IV SOLN
INTRAVENOUS | Status: AC
  Administered 2017-12-01: 8 ug via INTRAVENOUS
  Filled 2017-12-01: qty 4

## 2017-12-01 MED ORDER — METOCLOPRAMIDE HCL 5 MG/ML IJ SOLN
10.0000 mg | Freq: Once | INTRAMUSCULAR | Status: AC
  Administered 2017-12-01: 10 mg via INTRAVENOUS
  Filled 2017-12-01: qty 2

## 2017-12-01 MED ORDER — DIPHENHYDRAMINE HCL 50 MG/ML IJ SOLN
25.0000 mg | Freq: Once | INTRAMUSCULAR | Status: AC
  Administered 2017-12-01: 25 mg via INTRAVENOUS
  Filled 2017-12-01: qty 1

## 2017-12-01 MED ORDER — FENTANYL CITRATE (PF) 100 MCG/2ML IJ SOLN
50.0000 ug | Freq: Once | INTRAMUSCULAR | Status: AC
  Administered 2017-12-01: 50 ug via INTRAVENOUS
  Filled 2017-12-01: qty 2

## 2017-12-01 MED ORDER — KETOROLAC TROMETHAMINE 15 MG/ML IJ SOLN: 7.5000 mg | Freq: Once | INTRAMUSCULAR | Status: DC

## 2017-12-01 MED ORDER — MORPHINE SULFATE (PF) 4 MG/ML IV SOLN
4.0000 mg | Freq: Once | INTRAVENOUS | Status: AC
  Administered 2017-12-01: 4 mg via INTRAVENOUS
  Filled 2017-12-01: qty 1

## 2017-12-01 MED ORDER — APIXABAN 5 MG PO TABS
5.0000 mg | ORAL_TABLET | Freq: Two times a day (BID) | ORAL | Status: DC
  Administered 2017-12-01 – 2017-12-06 (×10): 5 mg via ORAL
  Filled 2017-12-01 (×11): qty 1

## 2017-12-01 MED ORDER — ONDANSETRON HCL 4 MG/2ML IJ SOLN
4.0000 mg | Freq: Four times a day (QID) | INTRAMUSCULAR | Status: DC | PRN
  Administered 2017-12-01 – 2017-12-06 (×17): 4 mg via INTRAVENOUS
  Filled 2017-12-01 (×18): qty 2

## 2017-12-01 MED ORDER — RENA-VITE PO TABS
1.0000 | ORAL_TABLET | Freq: Every day | ORAL | Status: DC
  Administered 2017-12-01 – 2017-12-06 (×6): 1 via ORAL
  Filled 2017-12-01 (×7): qty 1

## 2017-12-01 MED ORDER — DARBEPOETIN ALFA 200 MCG/0.4ML IJ SOSY
PREFILLED_SYRINGE | INTRAMUSCULAR | Status: AC
  Filled 2017-12-01: qty 0.4

## 2017-12-01 MED ORDER — DOXERCALCIFEROL 4 MCG/2ML IV SOLN
8.0000 ug | INTRAVENOUS | Status: DC
  Administered 2017-12-01: 8 ug via INTRAVENOUS
  Filled 2017-12-01 (×2): qty 4

## 2017-12-01 MED ORDER — ONDANSETRON HCL 4 MG/2ML IJ SOLN
4.0000 mg | Freq: Once | INTRAMUSCULAR | Status: AC
  Administered 2017-12-01: 4 mg via INTRAVENOUS
  Filled 2017-12-01: qty 2

## 2017-12-01 MED ORDER — AMMONIUM LACTATE 12 % EX LOTN
TOPICAL_LOTION | CUTANEOUS | Status: DC | PRN
  Filled 2017-12-01: qty 400

## 2017-12-01 NOTE — Progress Notes (Signed)
Patient's bed is too high but won't let this RN lower it down for  safety issue. Call bell within patient's reach. Katrisha Segall, [name], Therapist, sports

## 2017-12-01 NOTE — Progress Notes (Signed)
Patient very irritable,refused skin assessment. [place], [name], Therapist, sports
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
## 2017-12-01 NOTE — ED Triage Notes (Signed)
Patient brought in [facility] for complaint of constant abdominal pain and intermittent chest pressure. EMS was called to patient's dialysis center. EMS reports they were told by facility the staff there did not know why the patient went to the center this morning because his treatment was not scheduled until this morning. Patient states that he went this morning to see his kidney doctor and that the doctor told him to come to straight to ED.  Patient states he has has nausea and vomiting for days and diarrhea that started yesterday. Chest pain described as 9/10 chest pressure that started today. Received 369m aspirin from EMS PTA. MWF dialysis, fistula in left arm.

## 2017-12-01 NOTE — ED Notes (Signed)
Pt taken to xray 

## 2017-12-01 NOTE — Progress Notes (Signed)
MD notified that patient is refusing Oxy for pain management and refusing to allow vital, cardiac monitoring or any treatment until he get IV pain management. Arthor Captain LPN

## 2017-12-01 NOTE — Progress Notes (Signed)
Patient bed is too high and he refuses to allow bed to be lower to safe level. Arthor Captain LPN
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
## 2017-12-01 NOTE — Progress Notes (Signed)
Night Team Interval Progress Note  Notified of pt refusing various nursing and monitoring interventions with patient specifically requesting IV pain medications before taking his home meds or allowing for monitoring/assessments to be completed. Had refused oral pain medication. This is similar to his behavior noted on prior admissions and is concerning for drug seeking behavior. Discussed this behavior with the pt who denied he had made this request and reported he had just told the nurse to come back into the room as he was willing to receive care. Discussed his workup and management so far and informed him of the importance for monitoring and resuming his home medications as ordered. He was encouraged to allow staff to provide care to which he agreed. He was informed he has both anti-emetics and analgesics (oral at this time) ordered which he agreed to accept. If these are ineffective and pt is appropriately participating in his care, we will certainly increase his pain regimen as indicated to appropriately treat his symptoms.

## 2017-12-01 NOTE — Procedures (Signed)
   I was present at this dialysis session, have reviewed the session itself and made  appropriate changes Kelly Splinter MD [place] pager [phone]   12/01/2017, 4:49 PM

## 2017-12-01 NOTE — Progress Notes (Signed)
Md notified that patient did not have any orders for pain and nausea and diarrhea. Arthor Captain LPN
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
## 2017-12-01 NOTE — ED Notes (Signed)
Patient refused to change into gown.

## 2017-12-02 DIAGNOSIS — N2889 Other specified disorders of kidney and ureter: Secondary | ICD-10-CM | POA: Diagnosis not present

## 2017-12-02 DIAGNOSIS — D631 Anemia in chronic kidney disease: Secondary | ICD-10-CM | POA: Diagnosis not present

## 2017-12-02 DIAGNOSIS — Z9649 Presence of other endocrine implants: Secondary | ICD-10-CM | POA: Diagnosis not present

## 2017-12-02 DIAGNOSIS — I5020 Unspecified systolic (congestive) heart failure: Secondary | ICD-10-CM

## 2017-12-02 DIAGNOSIS — R079 Chest pain, unspecified: Secondary | ICD-10-CM | POA: Diagnosis not present

## 2017-12-02 DIAGNOSIS — Z7289 Other problems related to lifestyle: Secondary | ICD-10-CM

## 2017-12-02 DIAGNOSIS — I700 Atherosclerosis of aorta: Secondary | ICD-10-CM | POA: Diagnosis present

## 2017-12-02 DIAGNOSIS — I071 Rheumatic tricuspid insufficiency: Secondary | ICD-10-CM | POA: Diagnosis present

## 2017-12-02 DIAGNOSIS — Z86711 Personal history of pulmonary embolism: Secondary | ICD-10-CM | POA: Diagnosis not present

## 2017-12-02 DIAGNOSIS — Z992 Dependence on renal dialysis: Secondary | ICD-10-CM

## 2017-12-02 DIAGNOSIS — E875 Hyperkalemia: Secondary | ICD-10-CM

## 2017-12-02 DIAGNOSIS — Z79899 Other long term (current) drug therapy: Secondary | ICD-10-CM

## 2017-12-02 DIAGNOSIS — R109 Unspecified abdominal pain: Secondary | ICD-10-CM | POA: Diagnosis not present

## 2017-12-02 DIAGNOSIS — Z8719 Personal history of other diseases of the digestive system: Secondary | ICD-10-CM

## 2017-12-02 DIAGNOSIS — Z9689 Presence of other specified functional implants: Secondary | ICD-10-CM

## 2017-12-02 DIAGNOSIS — K219 Gastro-esophageal reflux disease without esophagitis: Secondary | ICD-10-CM | POA: Diagnosis present

## 2017-12-02 DIAGNOSIS — N2581 Secondary hyperparathyroidism of renal origin: Secondary | ICD-10-CM | POA: Diagnosis not present

## 2017-12-02 DIAGNOSIS — I5042 Chronic combined systolic (congestive) and diastolic (congestive) heart failure: Secondary | ICD-10-CM

## 2017-12-02 DIAGNOSIS — I132 Hypertensive heart and chronic kidney disease with heart failure and with stage 5 chronic kidney disease, or end stage renal disease: Secondary | ICD-10-CM

## 2017-12-02 DIAGNOSIS — K861 Other chronic pancreatitis: Secondary | ICD-10-CM | POA: Diagnosis not present

## 2017-12-02 DIAGNOSIS — I313 Pericardial effusion (noninflammatory): Secondary | ICD-10-CM | POA: Diagnosis present

## 2017-12-02 DIAGNOSIS — K859 Acute pancreatitis without necrosis or infection, unspecified: Secondary | ICD-10-CM | POA: Diagnosis not present

## 2017-12-02 DIAGNOSIS — Z87891 Personal history of nicotine dependence: Secondary | ICD-10-CM | POA: Diagnosis not present

## 2017-12-02 DIAGNOSIS — J90 Pleural effusion, not elsewhere classified: Secondary | ICD-10-CM | POA: Diagnosis not present

## 2017-12-02 DIAGNOSIS — E1122 Type 2 diabetes mellitus with diabetic chronic kidney disease: Secondary | ICD-10-CM | POA: Diagnosis present

## 2017-12-02 DIAGNOSIS — N186 End stage renal disease: Secondary | ICD-10-CM

## 2017-12-02 DIAGNOSIS — J9811 Atelectasis: Secondary | ICD-10-CM | POA: Diagnosis present

## 2017-12-02 DIAGNOSIS — N289 Disorder of kidney and ureter, unspecified: Secondary | ICD-10-CM

## 2017-12-02 DIAGNOSIS — Z8249 Family history of ischemic heart disease and other diseases of the circulatory system: Secondary | ICD-10-CM | POA: Diagnosis not present

## 2017-12-02 DIAGNOSIS — Z8614 Personal history of Methicillin resistant Staphylococcus aureus infection: Secondary | ICD-10-CM

## 2017-12-02 DIAGNOSIS — I509 Heart failure, unspecified: Secondary | ICD-10-CM | POA: Diagnosis not present

## 2017-12-02 DIAGNOSIS — Z765 Malingerer [conscious simulation]: Secondary | ICD-10-CM | POA: Diagnosis not present

## 2017-12-02 DIAGNOSIS — Z7901 Long term (current) use of anticoagulants: Secondary | ICD-10-CM | POA: Diagnosis not present

## 2017-12-02 DIAGNOSIS — D638 Anemia in other chronic diseases classified elsewhere: Secondary | ICD-10-CM | POA: Diagnosis present

## 2017-12-02 DIAGNOSIS — E11649 Type 2 diabetes mellitus with hypoglycemia without coma: Secondary | ICD-10-CM | POA: Diagnosis present

## 2017-12-02 DIAGNOSIS — E8770 Fluid overload, unspecified: Secondary | ICD-10-CM

## 2017-12-02 DIAGNOSIS — Z86718 Personal history of other venous thrombosis and embolism: Secondary | ICD-10-CM | POA: Diagnosis not present

## 2017-12-02 DIAGNOSIS — I5040 Unspecified combined systolic (congestive) and diastolic (congestive) heart failure: Secondary | ICD-10-CM | POA: Diagnosis not present

## 2017-12-02 DIAGNOSIS — I429 Cardiomyopathy, unspecified: Secondary | ICD-10-CM | POA: Diagnosis present

## 2017-12-02 DIAGNOSIS — N185 Chronic kidney disease, stage 5: Secondary | ICD-10-CM

## 2017-12-02 DIAGNOSIS — E162 Hypoglycemia, unspecified: Secondary | ICD-10-CM | POA: Diagnosis not present

## 2017-12-02 DIAGNOSIS — E8889 Other specified metabolic disorders: Secondary | ICD-10-CM | POA: Diagnosis present

## 2017-12-02 LAB — TYPE AND SCREEN
ABO/RH(D): B POS
Antibody Screen: NEGATIVE
Unit division: 0

## 2017-12-02 LAB — BPAM RBC
Blood Product Expiration Date: 201905292359
ISSUE DATE / TIME: 201905131654
Unit Type and Rh: 7300

## 2017-12-02 LAB — POCT I-STAT, CHEM 8
BUN: 29 mg/dL — ABNORMAL HIGH (ref 6–20)
CHLORIDE: 97 mmol/L — AB (ref 101–111)
Calcium, Ion: 1.08 mmol/L — ABNORMAL LOW (ref 1.15–1.40)
Creatinine, Ser: 5.2 mg/dL — ABNORMAL HIGH (ref 0.61–1.24)
GLUCOSE: 71 mg/dL (ref 65–99)
HEMATOCRIT: 25 % — AB (ref 39.0–52.0)
HEMOGLOBIN: 8.5 g/dL — AB (ref 13.0–17.0)
POTASSIUM: 3.8 mmol/L (ref 3.5–5.1)
Sodium: 136 mmol/L (ref 135–145)
TCO2: 28 mmol/L (ref 22–32)

## 2017-12-02 MED ORDER — ONDANSETRON HCL 8 MG PO TABS: 8.0000 mg | ORAL_TABLET | Freq: Three times a day (TID) | ORAL | 0 refills | Status: DC | PRN

## 2017-12-02 MED ORDER — HYDROMORPHONE HCL 1 MG/ML IJ SOLN
1.0000 mg | Freq: Four times a day (QID) | INTRAMUSCULAR | Status: DC | PRN
  Administered 2017-12-02 – 2017-12-06 (×15): 1 mg via INTRAVENOUS
  Filled 2017-12-02 (×16): qty 1

## 2017-12-02 NOTE — Progress Notes (Signed)
Pt ambulated to nurses desk and back on pulse ox.  O2 sat 92-100% on RA.  Pt uses O2 prn at home.

## 2017-12-02 NOTE — Progress Notes (Signed)
Patient received 4 mg IV Zofran at 0328 for nausea patient stated that he is not having any vomiting. He asked for pain medication at 0420 when I offered the Oxy IR he stated that he is throwing up air and can't keep nothing down. He refused the Oxy IR and stated that he is fine to take that back.Arthor Captain LPN

## 2017-12-02 NOTE — Care Management Note (Signed)
Case Management Note  Patient Details  Name: Frank [name] MRN: [id number] Date of Birth: 1965-10-09  Subjective/Objective: Admitted for Acute on chronic pancreatitis. [name] noted.             Action/Plan: Prior to admission patient lived at home.  At discharge patient plans to return to same living situation.  Patient able to afford medications/food.  At discharge patient has transportation home.  NCM provided patient with (2) possible PCP providers on AVS.  No discharge needs noted at this time.    Expected Discharge Date:  12/02/17               Expected Discharge Plan:  Home/Self Care  In-House Referral:   N/A  Discharge planning Services  CM Consult  Status of Service:  In process, will continue to follow  Kristen Cardinal, RN 12/02/2017, 3:12 PM

## 2017-12-02 NOTE — Progress Notes (Addendum)
MD made aware of pts elevated BP and that his home medication has not been restarted.   24- MD called back and stated that he was okay with his pressure to monitor.

## 2017-12-02 NOTE — H&P (Signed)
Internal Medicine Attending Admission Note Date: 12/02/2017  Patient name: Frank [name] Medical record number: [id number] Date of birth: Mar 01, 1966 Age: 52 y.o. Gender: male  I saw and evaluated the patient. I reviewed the resident's note and I agree with the resident's findings and plan as documented in the resident's note.  Chief Complaint(s): Abdominal pain:  History - key components related to admission:  Frank [name] is a 52 year old man with a history of end-stage renal disease requiring hemodialysis, chronic pancreatitis complicated by an infected pseudocyst that required stent drainage, chronic diastolic heart failure, essential hypertension, and previous history of venous thromboembolism who presents to the emergency department with severe abdominal pain for the last 3-4 days. The pain is described as constant and dull. There are no exacerbating or relieving factors. It is associated with nausea and vomiting. He also notes diarrhea. He denies any fevers, shakes, or chills.  In the emergency department he also noted shortness of breath and chest pain and requested to be placed on hemodialysis. He was found to be volume overloaded with hyperkalemia. Nephrology was consulted for emergent hemodialysis which he underwent. Afterwards, he was admitted to the internal medicine teaching service for further evaluation and care. While in the emergency department, gastroenterology was called for recommendations for the abdominal pain and they recommended that he follow up with GI at [facility] for his abdominal pain as they apparently knew him well.  When seen on rounds the morning following admission the patient was somewhat hostile towards our visit. He was demanding of IV dilaudid and not interested in any other therapy or assessment. He articulated his frustrations with the system in that the abdominal pain has not been adequately addressed. At times he would lie in bed in the fetal position, and at other times  he would become animated and sitting up in bed. He wanted the pain to resolve immediately and was not interested in engaging in a conversation as we tied to better understand his symptoms with additional questions.    I subsequently went back to his room and tried to reengage him with questions to better define his history and symptoms.  Although initially hostile, he became more engaged when I explained to him I could not help him any further without better understanding what was going on.  He states that he has chronic abdominal pain that would flare periodically.  This time the pain is worsened by eating food, hence why he states he hasn't been able to eat for a few days.  Physical Exam - key components related to admission:  Vitals:   12/01/17 1917 12/01/17 2259 12/02/17 0700 12/02/17 0928  BP: (!) 165/112 (!) 157/116 (!) 150/110 (!) 152/109  Pulse: (!) 101 99 96 92  Resp: _0 Temp: 98.1 F (36.7 C) 97.7 F (36.5 C) 98.2 F (36.8 C) (!) 97.4 F (36.3 C)  TempSrc: Oral Oral  Oral  SpO2: 98% 96% 98% 100%  Weight: 170 lb 6.7 oz (77.3 kg)      Gen.: Well-developed, well-nourished, man initially sitting in the hallway, followed by lying in bed in the fetal position, followed by sitting bolt upright on the side of the bed. He was hostile in his interactions and demanding that his abdominal pain be resolved with IV pain medication. He was uninterested in further assessment until the pain had been adequately addressed.  Upon return visit  Abd: Soft, diffusely tender with very mild palpation with guarding.  No rebound.  Lab results:  Basic Metabolic Panel: Recent Labs    12/01/17 1101 12/01/17 1658  NA 137 136  K 6.6* 3.8  CL 98* 97*  CO2 24  --   GLUCOSE 71 71  BUN 62* 29*  CREATININE 9.40* 5.20*  CALCIUM 9.9  --    Liver Function Tests: Recent Labs    12/01/17 1101  AST 38  ALT 19  ALKPHOS 127*  BILITOT 1.5*  PROT 8.5*  ALBUMIN 2.9*   Recent Labs     12/01/17 1101  LIPASE 85*   CBC: Recent Labs    12/01/17 1101 12/01/17 1658  WBC 5.8  --   HGB 7.2* 8.5*  HCT 22.2* 25.0*  MCV 83.8  --   PLT 245  --    Misc. Labs:  BNP > 4500  Imaging results:   PA and lateral chest x-ray: Personally reviewed. Right pleural effusion with no infiltrates or masses. The effusion is unchanged from the previous chest x-ray on 11/16/2017.  CT scan of the abdomen: Personally reviewed. No acute abnormalities to explain his abdominal pain.  Other results:  EKG: Personally reviewed. Normal sinus rhythm at 90 bpm, mild left axis deviation, prolonged QTc at 506 seconds, no significant Q waves, no LVH by voltage, delayed R wave progression, no ST-T segment changes, T wave inversions in a strain pattern in the lateral leads. There is no significant change from the previous ECG on 11/17/2017.  Assessment & Plan by Problem:  Frank [name] is a 52 year old man with a history of end-stage renal disease requiring hemodialysis, chronic pancreatitis complicated by an infected pseudocyst that required stent drainage, chronic diastolic heart failure, essential hypertension, and previous history of venous thromboembolism who presents to the emergency department with severe abdominal pain for the last 3-4 days.  He carries a diagnosis of chronic pancreatitis, and has in fact had an associated infected pseudocyst that required interventional drainage. I think there is little argument that he has a history of pancreatic disease.   The more difficult question is whether he has acute pancreatitis, acute on chronic pancreatitis, chronic pancreatitis, or previous pancreatitis. His hostile demeanor and unwillingness to engage in questions to help sort out his clinical situation further complicates assessment and raises the specter of possible manipulation to receive IV narcotics.  As he gives a decent story for an episode of acute on chronic pancreatitis I think it is reasonable to  treat him with bowel rest and IV pain medication. We are unable to hydrate him given his underlying chronic kidney disease requiring hemodialysis. He also gives a decent story for chronic pancreatitis with the chronic abdominal pain.  Although he has loose stools they are not necessarily greasy to suggest malabsorption. That said, he may benefit from enzyme replacement therapy for his chronic abdominal pain once he is over the acute phase of his presumed pancreatitis. Although I may be manipulated into providing IV narcotics, I would rather err on the side of trusting the patient and treating legitimate pain, understanding at times it may be nothing more than drug seeking behavior.  With this in mind, as there was no acute inflammation seen on the CT scan, I will have a low threshold to convert back to oral pain medication and reintroduction of food if we do not see some clinical improvement in the next few days as this would make me lean more towards a diagnosis of chronic pancreatitis rather than acute pancreatitis.  1) Acute on chronic pancreatitis: We will make him nothing by mouth  and treat his abdominal pain with when necessary dilaudid intravenously. If there is no clinical improvement over the next few days, and we believe this is more consistent with his chronic pancreatitis, we will change course and initiate pancreatic enzyme therapy with meals in hopes of improving his chronic abdominal pain. Ultimately, he will have to return to [facility] to have his stent removed, as this is one week overdue. We do not believe this is urgent and needs to be done as an inpatient transfer. Of coarse, we can maintain this plan only if the patient allows [place] to appropriately monitor to assure his safety. If we are unable to appropriately monitor and care for him safely we will again need to change course.  2) Renal mass: Seen by [facility] urology who has been considering a nephrectomy for presumed renal cell cancer. This was  initially deferred because of his infected pseudocyst. Upon discharge, the importance of following up with [facility] urology will be stressed.  3) End-stage renal disease: We are limited in the amounts of fluid that we can provide during this acute episode. He will therefore have to by nothing by mouth and fluids managed via hemodialysis. At this point, he is volume overloaded and I believe that his transudative right-sided effusion is a manifestation of this.  We appreciate nephrology's help in managing his dialysis as an inpatient.  4) Disposition: I anticipate we should be seeing some improvement clinically over the next 48 hours with the bowel rest and pain rcontrol. At that point he should be stable for discharge home with follow-up at [facility] within their GI service as well as their urology service as noted above.

## 2017-12-02 NOTE — Progress Notes (Deleted)
[facility] Progress Note   Subjective: Says he is still having abdominal pain.   Objective Vitals:   12/01/17 1917 12/01/17 2259 12/02/17 0700 12/02/17 0928  BP: (!) 165/112 (!) 157/116 (!) 150/110 (!) 152/109  Pulse: (!) 101 99 96 92  Resp: _0 Temp: 98.1 F (36.7 C) 97.7 F (36.5 C) 98.2 F (36.8 C) (!) 97.4 F (36.3 C)  TempSrc: Oral Oral  Oral  SpO2: 98% 96% 98% 100%  Weight: 77.3 kg (170 lb 6.7 oz)      Physical Exam General: Chronically ill appearing male in NAD Heart: S1,S2, RRR Lungs: CTAB A/P Abdomen: tender to palpation UQ. Active BS.  Extremities: No LE edema.  Dialysis Access: LFA AVF + bruit   Additional Objective Labs: Basic Metabolic Panel: Recent Labs  Lab 12/01/17 1101 12/01/17 1658  NA 137 136  K 6.6* 3.8  CL 98* 97*  CO2 24  --   GLUCOSE 71 71  BUN 62* 29*  CREATININE 9.40* 5.20*  CALCIUM 9.9  --    Liver Function Tests: Recent Labs  Lab 12/01/17 1101  AST 38  ALT 19  ALKPHOS 127*  BILITOT 1.5*  PROT 8.5*  ALBUMIN 2.9*   Recent Labs  Lab 12/01/17 1101  LIPASE 85*   CBC: Recent Labs  Lab 12/01/17 1101 12/01/17 1658  WBC 5.8  --   HGB 7.2* 8.5*  HCT 22.2* 25.0*  MCV 83.8  --   PLT 245  --    Blood Culture    Component Value Date/Time   SDES NASOPHARYNGEAL 09/22/2017 0723   SPECREQUEST NONE 09/22/2017 0723   CULT  09/22/2017 0723    NO MRSA DETECTED Performed at [facility], [place] [address] [place], [place] [address]    REPTSTATUS 09/23/2017 FINAL 09/22/2017 0723    Cardiac Enzymes: No results for input(s): CKTOTAL, CKMB, CKMBINDEX, TROPONINI in the last 168 hours. CBG: No results for input(s): GLUCAP in the last 168 hours. Iron Studies: No results for input(s): IRON, TIBC, TRANSFERRIN, FERRITIN in the last 72 hours. _1 @ Studies/Results: Ct Abdomen Pelvis Wo Contrast  Result Date: 12/01/2017 CLINICAL DATA:  Abdominal pain, nausea and vomiting. Dialysis patient.  EXAM: CT ABDOMEN AND PELVIS WITHOUT CONTRAST TECHNIQUE: Multidetector CT imaging of the abdomen and pelvis was performed following the standard protocol without IV contrast. COMPARISON:  CT 09/22/2017 FINDINGS: Lower chest: Newly seen moderate to large right effusion layering dependently with dependent atelectasis. Cardiomegaly. Pericardial fluid as seen previously. Hepatobiliary: No liver parenchymal abnormality seen without contrast. Question cirrhosis with relative prominence of the left lobe. No calcified gallstones. Pancreas: Normal Spleen: Normal Adrenals/Urinary Tract: Adrenal glands are normal. Chronic renal atrophy. Redemonstration of a 3.2 cm exophytic lesion of the left kidney which is not visibly changing over time. 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 Similar appearance to previous studies. Stomach/Bowel: Previously seen lesser sac fluid collection is absent. No ascites seen elsewhere. Vascular/Lymphatic: Aortic atherosclerosis. No aneurysm. IVC is normal. Reproductive: Normal Other: No free fluid or air.Spool shaped abnormality apparently within the gastric fundus. Petra Kuba of this is uncertain. Could this be an ingested foreign object? Musculoskeletal: Negative IMPRESSION: Newly seen right pleural effusion with dependent atelectasis. Chronic cardiomegaly and pericardial effusion. Resolution of previously seen lesser sac fluid collection. Apparent ingested object within the stomach which looks like a spool. Maximal dimension is 2.8 cm No acute abnormality otherwise identified. Electronically Signed   By: [name] M.D.   On:  12/01/2017 15:09   Dg Chest 2 View  Result Date: 12/01/2017 CLINICAL DATA:  Shortness of breath, chest and abdominal pain. The patient could not cooperate fully with positioning. EXAM: CHEST - 2 VIEW COMPARISON:  PA and lateral chest x-ray of November 16, 2017 FINDINGS: The left lung is well-expanded. On the right there is volume loss due to a  moderate-sized pleural effusion which is slightly more conspicuous today. The interstitial markings are increased. And are more conspicuous today. The cardiac silhouette remains enlarged. The pulmonary vascularity is more engorged and less distinct. There is calcification in the wall of the aortic arch. The bony thorax exhibits no acute abnormality. IMPRESSION: Increased volume of the right pleural effusion, increased pulmonary interstitial edema, and increased pulmonary vascular congestion are consistent with worsening CHF. Thoracic aortic atherosclerosis. Electronically Signed   By: David  Martinique M.D.   On: 12/01/2017 10:33   Medications: . sodium chloride     . apixaban  5 mg Oral BID  . cinacalcet  90 mg Oral Q supper  . doxercalciferol  8 mcg Intravenous Q M,W,F-HD  . lisinopril  5 mg Oral Daily  . multivitamin  1 tablet Oral QHS  . sevelamer carbonate  2,400 mg Oral TID WC   Dialysis Orders: [place] [place] MWF 4 hrs 180 NRe 400/800 73.5 kg 2.0 K/2.0 Ca -Heparin: None -Hectorol 8 mcg IV TIW -Mircera 225 mcg IV q 2 weeks (last dose 11/17/2017 last HGB 7.7 11/19/2017   Assessment/Plan: 1.  Combined systolic & diastolic HF/Volume overload: Confirmed per CXR. Urgent HD for hyperkalemia and volume overload. Attempt to push to EDW-4 liters. Continue carvedilol, hydralazine.   2.  Hyperkalemia: K+ 6.6. Urgent HD for hyperkalamia. Now resolved.  3.  ESRD - MWF via AVF. HD tomorrow at OP center.  4.  Hypertension/volume  - Resume home meds. BP usually comes down with volume removal. HD yesterday for volume overload. Only got to 77.3 kg. Keep OP EDW upon discharge.  5.  Anemia  - HGB 7.2 on adm. Transfused 1 unit of PRBCs with HD 12/02/17 HGB 8.5   6.  Metabolic bone disease - Continue binders, VDRA, sensipar 7.  Nutrition -NPO at present. Albumin 2.9 Renal/Carb mod diet when eating, prostat and renal vits.  8.  DM: per primary 9. H/O infected pancreatic pseudocyst: per primary 10. H/O DVT/PE. Was  started on Eliquis recent during hospitalization at [facility] 10/2017. Per primary.   Disposition: Stable from renal stand point for DC.   Rita H. Brown NP-C 12/02/2017, 11:55 AM  [employer] [phone]

## 2017-12-02 NOTE — Progress Notes (Addendum)
Subjective: Patient was initially sitting out in the hall and appeared comfortable when approached on rounds. He ambulated without difficulty back to his room then threw himself on the bed and curled up in the fetal position, complaining of severe abdominal pain. Very upset regarding his pain regimen. He has been refusing RN monitoring, PO pain mediations, and lab draws; demanding IV dilaudid. Reports he is dry heaving and unable to take his meds.   Objective:  Vital signs in last 24 hours: Vitals:   12/01/17 1917 12/01/17 2259 12/02/17 0700 12/02/17 0928  BP: (!) 165/112 (!) 157/116 (!) 150/110 (!) 152/109  Pulse: (!) 101 99 96 92  Resp: _0 Temp: 98.1 F (36.7 C) 97.7 F (36.5 C) 98.2 F (36.8 C) (!) 97.4 F (36.3 C)  TempSrc: Oral Oral  Oral  SpO2: 98% 96% 98% 100%  Weight: 170 lb 6.7 oz (77.3 kg)      Physical Exam Constitutional: Appears well, angry about pain regimen  Pulmonary/Chest: Breathing comfortably on RA, speaking in full sentences  Abdominal: Unable to assess, patient uncooperative with exam  Neurological: A&Ox3, CN II - XII grossly intact. Moving all extremities spontaneously, ambulates without difficulty  Skin: No rashes or erythema  Psychiatric: Normal mood and affect    Assessment/Plan:  Acute on Chronic Pancreatitis   History of Infected Pancreatic Pseudocyst Recently admitted and managed at [facility] in early April for MRSA infected pseudocyst with stenting and conduit placement. Visualized material here on CT likely from prior procedure / stent. Plan was for outpatient follow up for stent removal however he has not followed up for this. He is currently afebrile and there's no elevated WBC.  Lipase here is elevated at 85 however this is not reliable in ESRD.  GI was consulted by the ED who recommended patient follow up at [facility] for further management given his lab work is otherwise reassuring.   Given his history, we will treat him clinically for acute on  chronic pancreatitis. If no improvement in the next couple of days, will consider transitioning to creon for treatment of chronic pancreatitis.   -- Remains afebrile and hemodynamically stable, no labs today as patient refused but labs yesterday are reassuring  -- IV Dilaudid 1 mg q6h PRN   -- IV zofran 4 mg q6h PRN  -- Continuing home Oxy 5 mg prn  -- No fluids due to ESRD on HD -- Follow up with [facility] outpatient GI for stent removal   Pain Medication Seeking Behavior On chart review, pain control has been an issue. Documentation from prior hospitalizations note concern for pain seeking behavior with patient requesting Dilaudid and declining various aspects of medical care until receiving pain meds. This has continued to be an issue this hospitalization. He has refused nursing assessments, lab draws, and PO pain medications today. Database review shows multiple prescribers, different pharmacies, and various pain medications over the past year.  We are treating him symptomatically for acute on chronic pancreatitis. If this behavior continues and he is refusing to be monitored or cooperative with nursing staff, will will discontinue IV pain medication.  -- Pain management above   Right Sided Pleural Effusion Chest pain Volume overload  Presenting with re-accumulation of his right sided pleural effusion. He was admitted for this effusion 4/10-4/15 at [facility] with thoracentesis performed and labs consistent with a transudative process. He also received a thoracentesis prior to this admission on 4/5 at [facility]. He was noted to re-accumulate fluid with repeat  a repeat thoracentesis recommended during that stay, however, patient declined and was subsequently discharged.  During his most recent hospitalization, repeat thoracentesis was not pursued as the degree of effusion was felt to be stable from prior. - Monitor vital signs, O2  - Consider repeat thoracentesis as an outpatient, currently stable    ESRD on MWF HD Hyperkalemia Renal mass S/p HD yesterday.   History of Combined CHF HTN LV EF noted to be 35-40% last month.  Volume status has been managed by HD His outpatient BP regimen includes lisinopril.  Last hospitalization, he developed bradycardia requiring atropine when his home beta blocker was resumed on admission. - Continue lisinopril  History of DVT/PE Currently on Eliquis due to concerns for adherence. - Continue Eliquis  Dispo: Anticipated discharge today.   Velna Ochs, MD 12/02/2017, 10:56 AM Pager: [phone]

## 2017-12-02 NOTE — Progress Notes (Signed)
[facility] Progress Note  Subjective: still having LUQ pain, no sig SOB. 2.6 L off w HD yest, SOB improved. K down < 5 today.   Vitals:   12/01/17 1917 12/01/17 2259 12/02/17 0700 12/02/17 0928  BP: (!) 165/112 (!) 157/116 (!) 150/110 (!) 152/109  Pulse: (!) 101 99 96 92  Resp: _0 Temp: 98.1 F (36.7 C) 97.7 F (36.5 C) 98.2 F (36.8 C) (!) 97.4 F (36.3 C)  TempSrc: Oral Oral  Oral  SpO2: 98% 96% 98% 100%  Weight: 77.3 kg (170 lb 6.7 oz)       Inpatient medications: . apixaban  5 mg Oral BID  . cinacalcet  90 mg Oral Q supper  . doxercalciferol  8 mcg Intravenous Q M,W,F-HD  . lisinopril  5 mg Oral Daily  . multivitamin  1 tablet Oral QHS  . sevelamer carbonate  2,400 mg Oral TID WC   . sodium chloride     ammonium lactate, iopamidol, ondansetron (ZOFRAN) IV, oxyCODONE  Exam: wdwn aam no distress  no jvd  chest mostly clear no wheezing  cor reg 2/6 sem  abd soft no ascites  ext 1+ leg edema bilat  nf ox 3  lfa avf +bruit  [facility]  4h  400/800  73.5kg   2/2 bath  Heparin none  Left arm avf  hectorol 8 ug tiw  mircera 225 every 2 wks last 4/29 (due 5/13)      Impression: 1  Vol overload/ SOB/ pulm edema - HD yest w/ improved symptoms, still up 4kg 2  Abd pain - LUQ pain; CT showed resolution of peripanc fluid collection and foreign body in the stomach (which is likely the stent placed at [facility] to drain fluid collection into stomach); otherwise no cause for abd pain noted per abd CT 3  esrd on HD mwf 4  HTN cont home meds 5  DM 2 6  Anemia - sp prbc x 1 here 7  Mbd ckd - cont meds 8  Hx DVT/ PE - cont eliquis 9  Hyperkalemia - resolved w hd  Plan - as above, HD tomorrow if still here   Kelly Splinter MD [facility] pager [phone]   12/02/2017, 12:09 PM   Recent Labs  Lab 12/01/17 1101 12/01/17 1658  NA 137 136  K 6.6* 3.8  CL 98* 97*  CO2 24  --   GLUCOSE 71 71  BUN 62* 29*  CREATININE 9.40*  5.20*  CALCIUM 9.9  --    Recent Labs  Lab 12/01/17 1101  AST 38  ALT 19  ALKPHOS 127*  BILITOT 1.5*  PROT 8.5*  ALBUMIN 2.9*   Recent Labs  Lab 12/01/17 1101 12/01/17 1658  WBC 5.8  --   HGB 7.2* 8.5*  HCT 22.2* 25.0*  MCV 83.8  --   PLT 245  --    Iron/TIBC/Ferritin/ %Sat    Component Value Date/Time   IRON 41 (L) 05/29/2016 1350   TIBC 190 (L) 05/29/2016 1350   FERRITIN 328 11/02/2015 0346   IRONPCTSAT 22 05/29/2016 1350

## 2017-12-02 NOTE — Progress Notes (Signed)
Pt refused labs this morning.  Pt states needs pain medication IV for stomach pain.  Pt refused po oxycodone.

## 2017-12-02 NOTE — Progress Notes (Signed)
Internal Medicine Attending  Date: 12/02/2017  Patient name: Frank [name] Medical record number: [id number] Date of birth: 05-Oct-1965 Age: 52 y.o. Gender: male  I saw and evaluated the patient. I reviewed the resident's note by Dr. Philipp Ovens and I agree with the resident's findings and plans as documented in her progress note.  Please see my H&P dated 12/02/2017 for the specifics of my evaluation, assessment, and plan from earlier in the day.

## 2017-12-02 NOTE — Care Management Obs Status (Signed)
[employer] NOTIFICATION   Patient Details  Name: Frank [name] MRN: [id number] Date of Birth: 10-23-1965   Medicare Observation Status Notification Given:  Yes    Kristen Cardinal, RN 12/02/2017, 4:53 PM

## 2017-12-03 DIAGNOSIS — E162 Hypoglycemia, unspecified: Secondary | ICD-10-CM

## 2017-12-03 LAB — GLUCOSE, CAPILLARY
GLUCOSE-CAPILLARY: 74 mg/dL (ref 65–99)
GLUCOSE-CAPILLARY: 61 mg/dL — AB (ref 65–99)
Glucose-Capillary: 37 mg/dL — CL (ref 65–99)
Glucose-Capillary: 42 mg/dL — CL (ref 65–99)
Glucose-Capillary: 135 mg/dL — ABNORMAL HIGH (ref 65–99)
Glucose-Capillary: 60 mg/dL — ABNORMAL LOW (ref 65–99)
Glucose-Capillary: 89 mg/dL (ref 65–99)

## 2017-12-03 LAB — CBC
HEMATOCRIT: 29.2 % — AB (ref 39.0–52.0)
Hemoglobin: 9.2 g/dL — ABNORMAL LOW (ref 13.0–17.0)
MCH: 27.1 pg (ref 26.0–34.0)
MCHC: 31.5 g/dL (ref 30.0–36.0)
MCV: 85.9 fL (ref 78.0–100.0)
PLATELETS: 202 10*3/uL (ref 150–400)
RBC: 3.4 MIL/uL — ABNORMAL LOW (ref 4.22–5.81)
RDW: 20.5 % — AB (ref 11.5–15.5)
WBC: 5.4 10*3/uL (ref 4.0–10.5)

## 2017-12-03 LAB — BASIC METABOLIC PANEL
Anion gap: 18 — ABNORMAL HIGH (ref 5–15)
BUN: 44 mg/dL — AB (ref 6–20)
CO2: 23 mmol/L (ref 22–32)
CREATININE: 8.43 mg/dL — AB (ref 0.61–1.24)
Calcium: 9.8 mg/dL (ref 8.9–10.3)
Chloride: 95 mmol/L — ABNORMAL LOW (ref 101–111)
GFR calc Af Amer: 7 mL/min — ABNORMAL LOW (ref >60)
GFR calc non Af Amer: 6 mL/min — ABNORMAL LOW (ref >60)
GLUCOSE: 49 mg/dL — AB (ref 65–99)
POTASSIUM: 5 mmol/L (ref 3.5–5.1)
Sodium: 136 mmol/L (ref 135–145)

## 2017-12-03 LAB — MRSA PCR SCREENING: MRSA by PCR: NEGATIVE

## 2017-12-03 MED ORDER — DEXTROSE 50 % IV SOLN
INTRAVENOUS | Status: AC
  Filled 2017-12-03: qty 50

## 2017-12-03 MED ORDER — DEXTROSE 50 % IV SOLN
INTRAVENOUS | Status: AC
  Administered 2017-12-03: 50 mL
  Filled 2017-12-03: qty 50

## 2017-12-03 NOTE — Progress Notes (Signed)
[facility] Progress Note  Subjective: NPO now for suspected acute pancreatitis and pain is a lot better  Vitals:   12/02/17 2152 12/03/17 0652 12/03/17 0932 12/03/17 0932  BP: (!) 163/120 (!) 184/123 (!) 156/121 (!) 156/121  Pulse: 99 99 98 98  Resp:  _0 Temp:   (!) 97.5 F (36.4 C) (!) 97.5 F (36.4 C)  TempSrc:   Oral Oral  SpO2: 93% 97% 93% 95%  Weight:        Inpatient medications: . apixaban  5 mg Oral BID  . cinacalcet  90 mg Oral Q supper  . doxercalciferol  8 mcg Intravenous Q M,W,F-HD  . lisinopril  5 mg Oral Daily  . multivitamin  1 tablet Oral QHS  . sevelamer carbonate  2,400 mg Oral TID WC   . sodium chloride     ammonium lactate, HYDROmorphone (DILAUDID) injection, iopamidol, ondansetron (ZOFRAN) IV, oxyCODONE  Exam: wdwn aam no distress  no jvd  chest clear no wheezing  cor reg 2/6 sem  abd soft no ascites, very tender mostly in LUQ  ext 1+ leg edema bilat  nf ox 3  lfa avf +bruit  [facility]  4h  400/800  73.5kg   2/2 bath  Heparin none  Left arm avf  hectorol 8 ug tiw  mircera 225 every 2 wks last 4/29 (due 5/13)      Impression: 1  Vol overload/ SOB/ pulm edema - improved sp HD on 5/13, HD today get vol down further 2  Abd pain - LUQ pain in setting of recent stent drainage of infect pseudocyst at [facility]; now is npo for possible acute pancreatitis and is feeling better today 3  esrd on HD mwf 4  HTN cont home meds 5  DM 2 6  Anemia - sp prbc x 1 here 7  Mbd ckd - cont meds 8  Hx DVT/ PE - cont eliquis 9  Hyperkalemia - resolved w hd  Plan - HD today, max UF as Ronnie Derby MD [facility] pager [phone]   12/03/2017, 2:17 PM   Recent Labs  Lab 12/01/17 1101 12/01/17 1658 12/03/17 0529  NA 137 136 136  K 6.6* 3.8 5.0  CL 98* 97* 95*  CO2 24  --  23  GLUCOSE 71 71 49*  BUN 62* 29* 44*  CREATININE 9.40* 5.20* 8.43*  CALCIUM 9.9  --  9.8   Recent Labs  Lab 12/01/17 1101   AST 38  ALT 19  ALKPHOS 127*  BILITOT 1.5*  PROT 8.5*  ALBUMIN 2.9*   Recent Labs  Lab 12/01/17 1101 12/01/17 1658 12/03/17 0529  WBC 5.8  --  5.4  HGB 7.2* 8.5* 9.2*  HCT 22.2* 25.0* 29.2*  MCV 83.8  --  85.9  PLT 245  --  202   Iron/TIBC/Ferritin/ %Sat    Component Value Date/Time   IRON 41 (L) 05/29/2016 1350   TIBC 190 (L) 05/29/2016 1350   FERRITIN 328 11/02/2015 0346   IRONPCTSAT 22 05/29/2016 1350

## 2017-12-03 NOTE — Progress Notes (Signed)
Patients BP was 156/121, Internal Med paged: continue to hold BP meds prior to HD. Will continue to monitor

## 2017-12-03 NOTE — Progress Notes (Signed)
Internal Medicine Attending  Date: 12/03/2017  Patient name: Frank [name] Medical record number: [id number] Date of birth: February 13, 1966 Age: 52 y.o. Gender: male  I saw and evaluated the patient. I reviewed the resident's note by Dr. Philipp Ovens and I agree with the resident's findings and plans as documented in her progress note.  When seen on rounds this morning Frank [name] was much more comfortable and was much more cooperative with the interview. He was interested in trying clear liquids today. We will advance as tolerated. I agree with the housestaff that the low sugars are likely secondary to a pancreas that is unable to produce enough glucagon during a nothing by mouth state. As we reflect more on his presentation I will raise the possibility that what he may have presented with was not acute on chronic pancreatitis but acute opioid withdrawal. It can present very similarly and he has improved markedly with the dilaudid. If he tolerates a diet well and receives hemodialysis with some volume removal he should be stable for discharge home in the very near future.  I would not re-tap the right sided transudative pleural effusion.  This is best managed with volume removal at hemodialysis.

## 2017-12-03 NOTE — Progress Notes (Signed)
Subjective: Continues to have severe abdominal pain, mostly LLQ. Had two episodes of emesis yesterday, none today.   Objective:  Vital signs in last 24 hours: Vitals:   12/02/17 1826 12/02/17 2151 12/02/17 2152 12/03/17 0652  BP: (!) 147/103 (!) 171/130 (!) 163/120 (!) 184/123  Pulse: (!) 101 98 99 99  Resp:  18  16  Temp:  97.8 F (36.6 C)    TempSrc:  Oral    SpO2:  100% 93% 97%  Weight:        Physical Exam Constitutional: Appears well, NAD Pulmonary/Chest: Breathing comfortably on RA, speaking in full sentences  Abdominal: Tender to palpation diffusely but mainly over his LLQ, soft, hypoactive bowel sounds Neurological: A&Ox3, CN II - XII grossly intact.  Skin: No rashes or erythema  Psychiatric: Normal mood and affect  Assessment/Plan:  52 yo M with a pmhx of chronic pancreatitis, HTN, Combined CHF, ESRD on HD, and hx of DVT/PE on eliquis presenting with acute on chronic abdominal pain, nausea, and vomiting. Patient was recently admitted and managed at [facility] in early April forMRSAinfected pseudocyst with stentingandconduit placement. Visualized material here on CT likely from prior procedure / stent. Plan was for outpatient follow up for stent removal however he has not followed up for this. He is currently afebrile and there's no elevated WBC. Lipase here is elevated at 85 however this is not reliable in ESRD. GI was consulted by the ED who recommended patient follow up at [facility] for further management given his lab work is otherwise reassuring.   Acute on Chronic Pancreatitis   History of InfectedPancreatic Pseudocyst Given his history, we are treating him empirically for acute on chronic pancreatitis with bowel rest and pain control. No fluids given ESRD. Reports on-going pain, no further episodes of emesis. He feels hungry and is willing to try liquids today. May need creon once over this acute event for treatment of his chronic pancreatitis.  -- Remains afebrile and  hemodynamically stable, labs today are reassuring  -- IV Dilaudid 1 mg q6h PRN   -- IV zofran 4 mg q6h PRN  -- Continuing home Oxy 5 mg prn  -- No fluids due to ESRD on HD -- NPO --> Advance to clear liquids -- Follow up with [facility] outpatient GI for stent removal   Hypoglycemia: In the setting of NPO for bowel rest and chronic pancreatitis. Patient is likely deficient in glucagon. He is not on exogenous insulin. We are attempting to advance diet today, will continue to monitor and treat PRN with D50.  -- CBG checks TID AC & QHS for now -- D50 amps PRN  Pain Medication Seeking Behavior On chart review, pain controlhas been an issue. Documentation from prior hospitalizations noteconcern for pain seeking behavior with patient requesting Dilaudid and declining various aspects of medical care until receiving pain meds.This has continued to be an issue this hospitalization. He has refused nursing assessments, lab draws, and PO pain medications today. Database review shows multiple prescribers, different pharmacies, and various pain medications over the past year. We are treating him symptomatically for acute on chronic pancreatitis. If this behavior continues and he is refusing to be monitored or cooperative with nursing staff, will will discontinue IV pain medication.  -- Pain management above   RightSided Pleural Effusion Chest pain Volume overload Presenting with re-accumulation of his right sided pleural effusion. He was admitted for this effusion 4/10-4/15 at [facility] with thoracentesis performedandlabs consistent with a transudative process. He also received a thoracentesis  prior to this admission on 4/5at [facility]. He was noted to re-accumulate fluid with repeata repeat thoracentesisrecommended during that stay, however, patientdeclined and was subsequently discharged. During his most recent hospitalization, repeat thoracentesis was not pursued as the degree of effusion was felt to be  stable from prior. - Monitor vital signs, O2  - Consider repeat thoracentesisas an outpatient, currently stable   ESRD on MWF HD Hyperkalemia Renal mass -- Plan for HD today   History of Combined CHF LV EF noted to be 35-40% last month. Volumestatus has beenmanaged by HD HisoutpatientBPregimen includes lisinopril. Last hospitalization, he developed bradycardia requiring atropine when his home beta blocker was resumed on admission.  HTN: BP uncontrolled today. Likely component of volume overload, will reassess after HD today.  -Continue lisinopril 5 mg   History ofDVT/PE Currently on Eliquisdue to concerns for adherence. - Continue Eliquis  FEN: Fluid restrict, lytes per HD, clear liquid diet VTE ppx: Eliquis  Code Status: FULL  Dispo: Anticipated discharge pending patient is tolerating PO.   Velna Ochs, MD 12/03/2017, 7:29 AM Pager: [phone]

## 2017-12-03 NOTE — Progress Notes (Signed)
Hypoglycemic Event  8 am: CBG: 37  Treatment: 15 GM carbohydrate snack  Symptoms: None  Follow-up CBG: Time:8:17 CBG Result:42  Treatment: 1 amp D50   Follow-up CBG: 135  Possible Reasons for Event: Inadequate meal intake  Comments/MD notified:Orders for clear liquid diet  12 pm:  CBG:60  Treatment: 15 GM carbohydrate snack  Symptoms: None Follow up CBG: 74    Frank [name]

## 2017-12-04 DIAGNOSIS — I509 Heart failure, unspecified: Secondary | ICD-10-CM

## 2017-12-04 LAB — GLUCOSE, CAPILLARY
GLUCOSE-CAPILLARY: 53 mg/dL — AB (ref 65–99)
GLUCOSE-CAPILLARY: 156 mg/dL — AB (ref 65–99)
GLUCOSE-CAPILLARY: 57 mg/dL — AB (ref 65–99)
GLUCOSE-CAPILLARY: 100 mg/dL — AB (ref 65–99)
GLUCOSE-CAPILLARY: 90 mg/dL (ref 65–99)
Glucose-Capillary: 37 mg/dL — CL (ref 65–99)
Glucose-Capillary: 43 mg/dL — CL (ref 65–99)
Glucose-Capillary: 117 mg/dL — ABNORMAL HIGH (ref 65–99)

## 2017-12-04 LAB — RENAL FUNCTION PANEL
Albumin: 2.9 g/dL — ABNORMAL LOW (ref 3.5–5.0)
Anion gap: 19 — ABNORMAL HIGH (ref 5–15)
BUN: 58 mg/dL — AB (ref 6–20)
CALCIUM: 9 mg/dL (ref 8.9–10.3)
CHLORIDE: 94 mmol/L — AB (ref 101–111)
CO2: 23 mmol/L (ref 22–32)
CREATININE: 9.9 mg/dL — AB (ref 0.61–1.24)
GFR calc Af Amer: 6 mL/min — ABNORMAL LOW (ref >60)
GFR calc non Af Amer: 5 mL/min — ABNORMAL LOW (ref >60)
GLUCOSE: 125 mg/dL — AB (ref 65–99)
Phosphorus: 8 mg/dL — ABNORMAL HIGH (ref 2.5–4.6)
Potassium: 5.5 mmol/L — ABNORMAL HIGH (ref 3.5–5.1)
SODIUM: 136 mmol/L (ref 135–145)

## 2017-12-04 LAB — CBC
HCT: 27.7 % — ABNORMAL LOW (ref 39.0–52.0)
Hemoglobin: 8.7 g/dL — ABNORMAL LOW (ref 13.0–17.0)
MCH: 26.8 pg (ref 26.0–34.0)
MCHC: 31.4 g/dL (ref 30.0–36.0)
MCV: 85.2 fL (ref 78.0–100.0)
PLATELETS: 196 10*3/uL (ref 150–400)
RBC: 3.25 MIL/uL — ABNORMAL LOW (ref 4.22–5.81)
RDW: 19.9 % — AB (ref 11.5–15.5)
WBC: 6.1 10*3/uL (ref 4.0–10.5)

## 2017-12-04 MED ORDER — GLUCOSE 4 G PO CHEW
CHEWABLE_TABLET | ORAL | Status: AC
  Administered 2017-12-04: 13:00:00
  Filled 2017-12-04: qty 1

## 2017-12-04 MED ORDER — PROMETHAZINE HCL 25 MG/ML IJ SOLN
12.5000 mg | Freq: Once | INTRAMUSCULAR | Status: AC
  Administered 2017-12-04: 12.5 mg via INTRAVENOUS

## 2017-12-04 MED ORDER — DEXTROSE 50 % IV SOLN
INTRAVENOUS | Status: AC
  Administered 2017-12-04: 50 mL
  Filled 2017-12-04: qty 50

## 2017-12-04 MED ORDER — GLUCOSE 4 G PO CHEW
CHEWABLE_TABLET | ORAL | Status: AC
  Administered 2017-12-04: 4 g
  Filled 2017-12-04: qty 1

## 2017-12-04 MED ORDER — GLUCAGON HCL RDNA (DIAGNOSTIC) 1 MG IJ SOLR
INTRAMUSCULAR | Status: AC
Start: 2017-12-04 — End: 2017-12-04
  Administered 2017-12-04: 1 mg
  Filled 2017-12-04: qty 1

## 2017-12-04 MED ORDER — HYDRALAZINE HCL 20 MG/ML IJ SOLN
10.0000 mg | Freq: Four times a day (QID) | INTRAMUSCULAR | Status: DC | PRN
  Administered 2017-12-04: 20 mg via INTRAVENOUS
  Administered 2017-12-06: 10 mg via INTRAVENOUS
  Filled 2017-12-04 (×2): qty 1

## 2017-12-04 MED ORDER — HYDRALAZINE HCL 50 MG PO TABS
50.0000 mg | ORAL_TABLET | Freq: Three times a day (TID) | ORAL | Status: DC
Start: 2017-12-04 — End: 2017-12-07
  Administered 2017-12-04 – 2017-12-07 (×6): 50 mg via ORAL
  Filled 2017-12-04 (×7): qty 1

## 2017-12-04 NOTE — Progress Notes (Addendum)
Subjective: Frank [name] was seen sitting up in his bed this morning. He stated that he continues to have abdominal pain and nausea. He vomited overnight. He states that he is hungry however and would like something to eat.   Objective:  Vital signs in last 24 hours: Vitals:   12/03/17 0932 12/03/17 1613 12/03/17 2036 12/04/17 0513  BP: (!) 156/121 (!) 164/121 (!) 159/116 (!) 161/128  Pulse: 98 95 99 95  Resp: _0 Temp: (!) 97.5 F (36.4 C) 98.4 F (36.9 C) 98.9 F (37.2 C) 98 F (36.7 C)  TempSrc: Oral Oral    SpO2: 95% 93% 99% 100%  Weight:       Physical Exam  Constitutional: He appears well-developed and well-nourished. No distress.  HENT:  Head: Normocephalic and atraumatic.  Eyes: Conjunctivae are normal.  Cardiovascular: Normal rate, regular rhythm and normal heart sounds.  No murmur heard. Respiratory: Effort normal and breath sounds normal. No respiratory distress. He has no wheezes.  GI: Soft. Bowel sounds are normal. He exhibits distension. There is tenderness (luq and llq).  Musculoskeletal: He exhibits no edema.  Neurological: He is alert.  Skin: He is not diaphoretic. No erythema.  Psychiatric: His behavior is normal. Judgment and thought content normal. His mood appears anxious.   Assessment/Plan:  Frank [name] is a 52 y.o m with chronic pancreatitis with recent stent placement for an infected pancreatic pseudocyst, combined heart failure (EF=35-40%,diffuse hypokinesis, pa press=38), ESRD on HD, hx of DVT/PE on eliquis who presented with acute on chronic abdominal pain, nausea and vomiting. Patient's symptoms are thought to be due to an acute worsening of his chronic pancreatitis.  Acute on chronic pancreatitis The patient continues to have abdominal pain and nausea. He will likely need few more days of hospitalization for his symptoms to be alleviated.   -Pain control with IV Dilaudid 58m q6hrs prn and oxycodone 58mq4hrs prn -IV zofran 90m61m6hrs prn for  nausea -Advance diet to renal diet with fluid restriction to 1500 -limiting fluids due to patient also having ESRD  -GI recommended follow up with [facility] outpatient for stent removal  Hypoglycemia  Patient has been having episodes of hypoglycemia recently. Glucose has been ranging 43-125 over the past 24 hrs. The patient received 2 amps of D50 overnight. Hypoglycemia is thought to be due to deficiency in glucagon in setting of being placed NPO and poor pancreatic function.   -Amps of D50 prn -Advancing diet to diet renal  Hypertension Patient's blood pressure over the past 24 hrs has ranged 156-164/121  ESRD on MWF HD Hyperkalemia  Renal mass  Patient's k=5.5 today (5/16), phos=8. Patient to get dialyzed today 5/16, last dialysis was 5/13.  History of DVT/PE -continue eliquis   Dispo: Anticipated discharge in approximately 1-2 day(s).   ChuLars MageD 12/04/2017, 6:44 AM Pager: [phone] Medicine Attending:   I saw and examined the patient. I reviewed the resident's note and I agree with the resident's findings and plan as documented in the resident's note.

## 2017-12-04 NOTE — Progress Notes (Signed)
[facility] Progress Note  Subjective: getting some clear liquids now , refused HD this am  Vitals:   12/03/17 1613 12/03/17 2036 12/04/17 0513 12/04/17 1004  BP: (!) 164/121 (!) 159/116 (!) 161/128 (!) 159/122  Pulse: 95 99 95 98  Resp:  _0 Temp: 98.4 F (36.9 C) 98.9 F (37.2 C) 98 F (36.7 C) (!) 97.5 F (36.4 C)  TempSrc: Oral   Oral  SpO2: 93% 99% 100% 97%  Weight:        Inpatient medications: . apixaban  5 mg Oral BID  . cinacalcet  90 mg Oral Q supper  . doxercalciferol  8 mcg Intravenous Q M,W,F-HD  . hydrALAZINE  50 mg Oral Q8H  . lisinopril  5 mg Oral Daily  . multivitamin  1 tablet Oral QHS  . sevelamer carbonate  2,400 mg Oral TID WC   . sodium chloride     ammonium lactate, hydrALAZINE, HYDROmorphone (DILAUDID) injection, iopamidol, ondansetron (ZOFRAN) IV, oxyCODONE  Exam: wdwn aam no distress  no jvd  chest clear no wheezing  cor reg 2/6 sem  abd soft no ascites, very tender mostly in LUQ  ext 1+ leg edema bilat  nf ox 3  lfa avf +bruit  [facility]  4h  400/800  73.5kg   2/2 bath  Heparin none  Left arm avf  hectorol 8 ug tiw  mircera 225 every 2 wks last 4/29 (due 5/13)      Impression: 1  Vol overload/ SOB/ pulm edema - cont to lower volume w/ HD today and tomorrow, still up 4kg 2  Abd pain - suspected acute pancreatitis, per primary team 3  SP recent stent drainage of infect pseudocyst at [facility] 4  esrd on HD mwf 5  HTN poorly controlled - added back acei yest , will ^acei + add hydralazine po, also prn IV hydral 6  Anemia - sp prbc x 1 here 7  Mbd ckd - cont meds 8  Hx DVT/ PE - on eliquis 9  Hyperkalemia - improved but not resolved  Plan - HD today and HD again tomorrow to get back on schedule   Kelly Splinter MD [facility] pager [phone]   12/04/2017, 1:05 PM   Recent Labs  Lab 12/01/17 1101 12/01/17 1658 12/03/17 0529 12/04/17 0824  NA 137 136 136 136  K 6.6* 3.8 5.0 5.5*   CL 98* 97* 95* 94*  CO2 24  --  23 23  GLUCOSE 71 71 49* 125*  BUN 62* 29* 44* 58*  CREATININE 9.40* 5.20* 8.43* 9.90*  CALCIUM 9.9  --  9.8 9.0  PHOS  --   --   --  8.0*   Recent Labs  Lab 12/01/17 1101 12/04/17 0824  AST 38  --   ALT 19  --   ALKPHOS 127*  --   BILITOT 1.5*  --   PROT 8.5*  --   ALBUMIN 2.9* 2.9*   Recent Labs  Lab 12/01/17 1101 12/01/17 1658 12/03/17 0529 12/04/17 0824  WBC 5.8  --  5.4 6.1  HGB 7.2* 8.5* 9.2* 8.7*  HCT 22.2* 25.0* 29.2* 27.7*  MCV 83.8  --  85.9 85.2  PLT 245  --  202 196   Iron/TIBC/Ferritin/ %Sat    Component Value Date/Time   IRON 41 (L) 05/29/2016 1350   TIBC 190 (L) 05/29/2016 1350   FERRITIN 328 11/02/2015 0346   IRONPCTSAT 22 05/29/2016 1350

## 2017-12-04 NOTE — Progress Notes (Signed)
Attempted to get pt for HD tx  but at first want to eat meal and then totally refused saying his CBG  not stable to have HD tx.

## 2017-12-04 NOTE — Progress Notes (Signed)
Patient is now refusing dialysis because of his blood sugars.

## 2017-12-04 NOTE — Progress Notes (Addendum)
Lunch time CBG was 53, patient agreed to drink apple juice, CBG only came up to 57, patient received 1 amp of D50, CBG came up to 156.   Meanwhile HD called for report, patient was agreeable to go when CBG was back up. Transporter waited, CBG was rechecked, but then patient refused to go before he ate his lunch. Lunch tray has been delivered, patient has not touched his food, and is laying in the bed with the lights off. Encouraged patient to eat so he can go to dialysis, patient just responded, "close my door please"

## 2017-12-05 LAB — RENAL FUNCTION PANEL
ALBUMIN: 2.8 g/dL — AB (ref 3.5–5.0)
Anion gap: 17 — ABNORMAL HIGH (ref 5–15)
BUN: 66 mg/dL — AB (ref 6–20)
CHLORIDE: 98 mmol/L — AB (ref 101–111)
CO2: 23 mmol/L (ref 22–32)
CREATININE: 11.67 mg/dL — AB (ref 0.61–1.24)
Calcium: 8.5 mg/dL — ABNORMAL LOW (ref 8.9–10.3)
GFR calc Af Amer: 5 mL/min — ABNORMAL LOW (ref >60)
GFR calc non Af Amer: 4 mL/min — ABNORMAL LOW (ref >60)
GLUCOSE: 68 mg/dL (ref 65–99)
POTASSIUM: 5.3 mmol/L — AB (ref 3.5–5.1)
Phosphorus: 7.4 mg/dL — ABNORMAL HIGH (ref 2.5–4.6)
Sodium: 138 mmol/L (ref 135–145)

## 2017-12-05 LAB — CBC
HEMATOCRIT: 28.6 % — AB (ref 39.0–52.0)
Hemoglobin: 9.2 g/dL — ABNORMAL LOW (ref 13.0–17.0)
MCH: 26.9 pg (ref 26.0–34.0)
MCHC: 32.2 g/dL (ref 30.0–36.0)
MCV: 83.6 fL (ref 78.0–100.0)
PLATELETS: 216 10*3/uL (ref 150–400)
RBC: 3.42 MIL/uL — ABNORMAL LOW (ref 4.22–5.81)
RDW: 20.1 % — AB (ref 11.5–15.5)
WBC: 4.8 10*3/uL (ref 4.0–10.5)

## 2017-12-05 LAB — GLUCOSE, CAPILLARY
Glucose-Capillary: 74 mg/dL (ref 65–99)
Glucose-Capillary: 109 mg/dL — ABNORMAL HIGH (ref 65–99)
Glucose-Capillary: 72 mg/dL (ref 65–99)

## 2017-12-05 IMAGING — CR DG CHEST 2V
2 series · 2 of 2 positions shown · non-contrast
Comparison: 02/10/2015

CLINICAL DATA: Chest pain and cough

EXAM:
CHEST  2 VIEW

[chest pa]
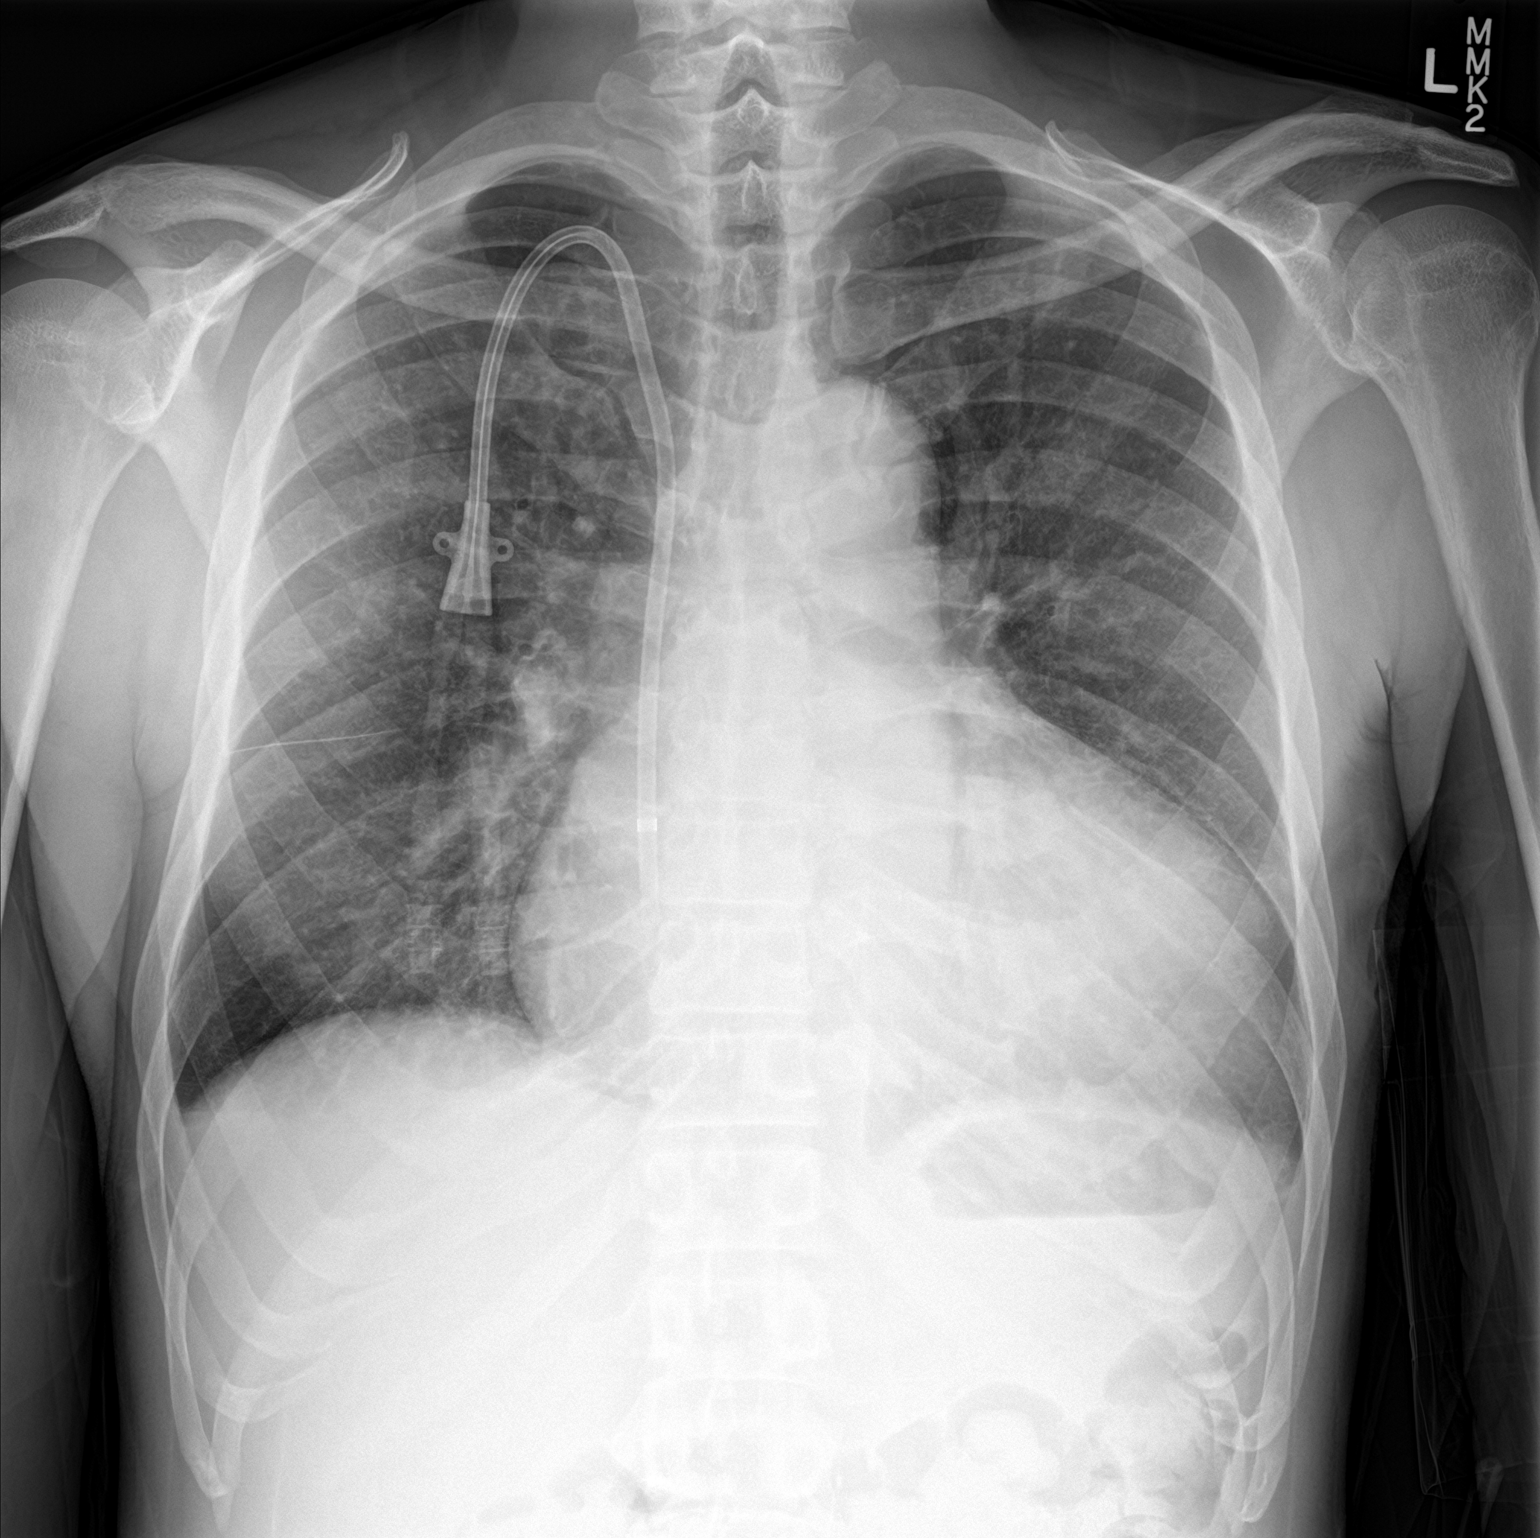

[chest lat]
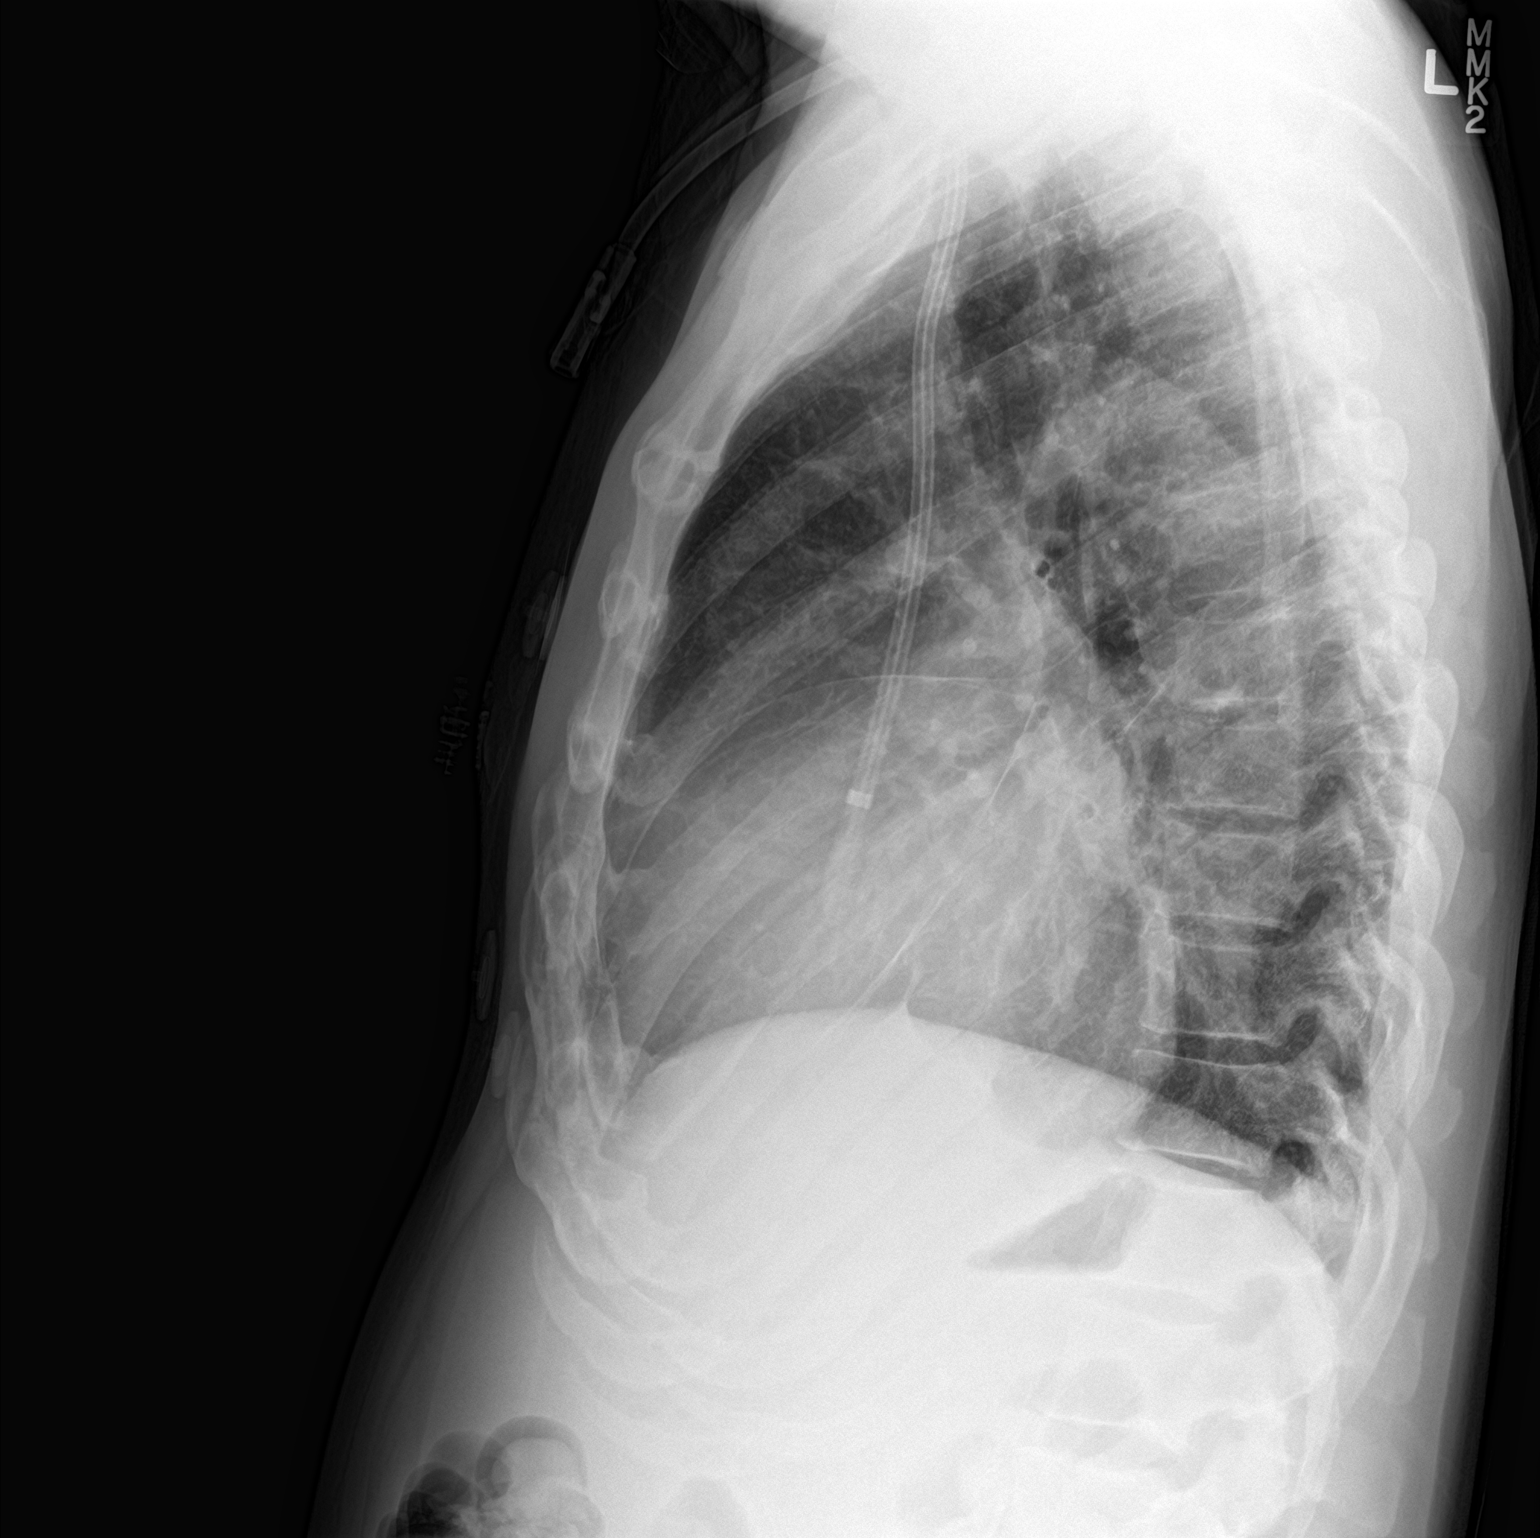

[2 of 2 positions shown; findings below may reference images not displayed]

FINDINGS: Dual-lumen dialysis catheter in the right atrium unchanged. Cardiac
enlargement with mild vascular congestion. Small pleural effusions
bilaterally. Negative for pneumonia.
IMPRESSION: Mild vascular congestion with small pleural effusions. Findings
consistent with mild fluid overload.

## 2017-12-05 NOTE — Progress Notes (Signed)
Patient called for pain med, when RN went to give patient's pain med,hydromorphone,patient asked " what's that?" This RN told him it's his pain med dilaudid. Patient refused to take it and wants the dilaudid that comes in a syringe,explained to patient that hydromorphone is the generic name of dilaudid and it comes also in a vial. Patient refused to take it and called the desk to ask what the generic name of dilaudid is, this RN then showed the scanned med in the [facility] that says hydromorphone(dilaudid). When RN flushed IV,it was leaking,this RN informed patient that he will need to get another IV first,patient insists for RN to give pain med because he's in pain. This RN informed patient that the IV is leaking and if the med is given it will just leak out. Patient then pulled IV out.Patient very irritable and told RN to hurry up and get the IV. Philander Ake, [name], Therapist, sports

## 2017-12-05 NOTE — Care Management Important Message (Signed)
Important Message  Patient Details  Name: Frank [name] MRN: [id number] Date of Birth: [birth year]   Medicare Important Message Given:  Yes    Philipp Callegari 12/05/2017, 1:45 PM

## 2017-12-05 NOTE — Progress Notes (Signed)
[facility] Progress Note  Subjective: on hd now eating solid foods abd pain a lot better  Vitals:   12/05/17 1130 12/05/17 1200 12/05/17 1230 12/05/17 1257  BP: (!) 180/104 139/87 (!) 180/113 (!) 164/107  Pulse: 97 96 81 93  Resp: _0 (!) 24  Temp:    97.7 F (36.5 C)  TempSrc:    Oral  SpO2:    97%  Weight:    74.5 kg (164 lb 3.9 oz)    Inpatient medications: . apixaban  5 mg Oral BID  . cinacalcet  90 mg Oral Q supper  . doxercalciferol  8 mcg Intravenous Q M,W,F-HD  . hydrALAZINE  50 mg Oral Q8H  . lisinopril  5 mg Oral Daily  . multivitamin  1 tablet Oral QHS  . sevelamer carbonate  2,400 mg Oral TID WC   . sodium chloride     ammonium lactate, hydrALAZINE, HYDROmorphone (DILAUDID) injection, iopamidol, ondansetron (ZOFRAN) IV, oxyCODONE  Exam: wdwn aam no distress  no jvd  chest clear no wheezing  cor reg 2/6 sem  abd soft no ascites, very tender mostly in LUQ  ext 1+ leg edema bilat  nf ox 3  lfa avf +bruit  [facility]  4h  400/800  73.5kg   2/2 bath  Heparin none  Left arm avf  hectorol 8 ug tiw  mircera 225 every 2 wks last 4/29 (due 5/13)      Impression: 1  Vol overload/ SOB/ pulm edema - mostly resolved down close to dry wt after hd today 2  Abd pain - suspected acute pancreatitis, per primary team 3  SP recent stent drainage of infect pseudocyst at [facility] 4  esrd on HD mwf 5  HTN - bp's improving will ^lisinopril 10 qd cont hydral 50 tid 6  Anemia - sp prbc x 1 here 7  Mbd ckd - cont meds 8  Hx DVT/ PE - on eliquis 9  Hyperkalemia - improved but not resolved  Plan - HD today and tomorrow (missed Wed session)   Kelly Splinter MD [facility] pager [phone]   12/05/2017, 2:20 PM   Recent Labs  Lab 12/03/17 0529 12/04/17 0824 12/05/17 0900  NA 136 136 138  K 5.0 5.5* 5.3*  CL 95* 94* 98*  CO2 _1 GLUCOSE 49* 125* 68  BUN 44* 58* 66*  CREATININE 8.43* 9.90* 11.67*  CALCIUM 9.8 9.0 8.5*   PHOS  --  8.0* 7.4*   Recent Labs  Lab 12/01/17 1101 12/04/17 0824 12/05/17 0900  AST 38  --   --   ALT 19  --   --   ALKPHOS 127*  --   --   BILITOT 1.5*  --   --   PROT 8.5*  --   --   ALBUMIN 2.9* 2.9* 2.8*   Recent Labs  Lab 12/03/17 0529 12/04/17 0824 12/05/17 0902  WBC 5.4 6.1 4.8  HGB 9.2* 8.7* 9.2*  HCT 29.2* 27.7* 28.6*  MCV 85.9 85.2 83.6  PLT 202 196 216   Iron/TIBC/Ferritin/ %Sat    Component Value Date/Time   IRON 41 (L) 05/29/2016 1350   TIBC 190 (L) 05/29/2016 1350   FERRITIN 328 11/02/2015 0346   IRONPCTSAT 22 05/29/2016 1350

## 2017-12-05 NOTE — Progress Notes (Signed)
   Subjective: Frank [name] was seen laying in his bed this morning. He states that he tried to eat some food yesterday and was able to get it down, but later he vomited. He continues to state that he has left sided abdominal pain. He remains anxious about his blood glucose measurements.   Objective:  Vital signs in last 24 hours: Vitals:   12/04/17 1004 12/04/17 1707 12/04/17 2045 12/05/17 0452  BP: (!) 159/122 (!) 163/120 (!) 152/109 (!) 155/105  Pulse: 98 98 90 87  Resp: _0 Temp: (!) 97.5 F (36.4 C) 98.2 F (36.8 C) 97.6 F (36.4 C) 97.6 F (36.4 C)  TempSrc: Oral Oral Oral Oral  SpO2: 97% 98% 98% 98%  Weight:       Physical Exam  Constitutional: He appears well-developed and well-nourished. No distress.  HENT:  Head: Normocephalic and atraumatic.  Eyes: Conjunctivae are normal.  Cardiovascular: Normal rate, regular rhythm and normal heart sounds.  No murmur heard. Respiratory: Effort normal and breath sounds normal. No respiratory distress. He has no wheezes.  GI: Soft. Bowel sounds are normal. He exhibits no distension. There is tenderness (diffuse but more prominent on left side).  Musculoskeletal: He exhibits no edema.  Neurological: He is alert.  Skin: Skin is dry. He is not diaphoretic. No erythema.  Psychiatric: He has a normal mood and affect. His behavior is normal. Judgment and thought content normal.   Assessment/Plan:  Frank [name] is a 52 y.o m with chronic pancreatitis with recent stent placement for an infected pancreatic pseudocyst, combined heart failure (EF=35-40%,diffuse hypokinesis, pa press=38), ESRD on HD, hx of DVT/PE on eliquis who presented with acute on chronic abdominal pain, nausea and vomiting. Patient's symptoms are thought to be due to an acute worsening of his chronic pancreatitis vs possible withdrawal of opioid medication.   Acute on chronic pancreatitis The patient continues to have abdominal pain and nausea. He was started on renal  diet yesterday 5/16. The patient states that he has been able to eat a small amount, but he continues to have pain and vomiting overnight. The patient also has a 3.2cm exophytic lesion on his left kidney that was noted on CT abdomen 12/01/17 that maybe further worsening his pain.   -Pain control with IV Dilaudid 81m q6hrs prn and oxycodone 568mq4hrs prn. Will transition to po dilaudid tomorrow 5/18. -IV zofran 7m41m6hrs prn for nausea -Advance diet to renal diet with fluid restriction to 1500 -limiting fluids due to patient also having ESRD  -GI recommended follow up with [facility] outpatient for stent removal  Hypoglycemia  The patient's blood glucose measurements continue to improve since last night ranging 68-100 with minimal oral intake.  -Amps of D50 prn -Currently on renal diet with 1500m68muid restriction  Hypertension Patient's blood pressure over the past 24 hrs has ranged 137-197/106-110.  -Hydralazine 50mg7mrs  -lisinopril 53m gq253mIV hydralazine 10-20mg q71m prn  ESRD on MWF HD Hyperkalemia  Renal mass  Patient was dialyzed today 5/17 may need to be dialyzed tomorrow again. Appreciate nephrology following.   -continue cinacalcet, doxercalciferol, renvela  History of DVT/PE -continue eliquis  Dispo: Anticipated discharge in approximately 1-2 day(s).   Frank [name],Lars Mage17/2019, 7:04 AM Pager: [phone]

## 2017-12-06 DIAGNOSIS — Z9649 Presence of other endocrine implants: Secondary | ICD-10-CM

## 2017-12-06 DIAGNOSIS — Z86711 Personal history of pulmonary embolism: Secondary | ICD-10-CM

## 2017-12-06 DIAGNOSIS — I5040 Unspecified combined systolic (congestive) and diastolic (congestive) heart failure: Secondary | ICD-10-CM

## 2017-12-06 LAB — GLUCOSE, CAPILLARY
GLUCOSE-CAPILLARY: 101 mg/dL — AB (ref 65–99)
Glucose-Capillary: 106 mg/dL — ABNORMAL HIGH (ref 65–99)
Glucose-Capillary: 79 mg/dL (ref 65–99)
Glucose-Capillary: 94 mg/dL (ref 65–99)

## 2017-12-06 MED ORDER — LISINOPRIL 10 MG PO TABS
10.0000 mg | ORAL_TABLET | Freq: Every day | ORAL | Status: DC
  Administered 2017-12-06: 10 mg via ORAL
  Filled 2017-12-06: qty 1

## 2017-12-06 MED ORDER — HYDROMORPHONE HCL 4 MG PO TABS: 4.0000 mg | ORAL_TABLET | Freq: Four times a day (QID) | ORAL | 0 refills | Status: AC | PRN

## 2017-12-06 MED ORDER — PROMETHAZINE HCL 25 MG PO TABS: 12.5000 mg | ORAL_TABLET | Freq: Four times a day (QID) | ORAL | 0 refills | Status: DC | PRN

## 2017-12-06 MED ORDER — HYDROMORPHONE HCL 2 MG PO TABS
4.0000 mg | ORAL_TABLET | Freq: Four times a day (QID) | ORAL | Status: DC | PRN
  Administered 2017-12-06 – 2017-12-07 (×4): 4 mg via ORAL
  Filled 2017-12-06 (×4): qty 2

## 2017-12-06 MED ORDER — PROMETHAZINE HCL 25 MG/ML IJ SOLN
25.0000 mg | Freq: Four times a day (QID) | INTRAMUSCULAR | Status: DC | PRN
  Administered 2017-12-06 – 2017-12-07 (×3): 25 mg via INTRAVENOUS
  Filled 2017-12-06 (×4): qty 1

## 2017-12-06 MED ORDER — PROMETHAZINE HCL 25 MG PO TABS: 25.0000 mg | ORAL_TABLET | Freq: Four times a day (QID) | ORAL | 0 refills | Status: DC | PRN

## 2017-12-06 MED ORDER — HYDROMORPHONE HCL 1 MG/ML IJ SOLN
1.0000 mg | Freq: Once | INTRAMUSCULAR | Status: AC
  Administered 2017-12-06: 1 mg via INTRAVENOUS
  Filled 2017-12-06: qty 1

## 2017-12-06 NOTE — Progress Notes (Signed)
   Subjective:  Frank [name] was seen laying in his bed this morning. He stated that he has not been feeling nauseous and therefore he has not been eating. He continues to state that he has left sided abdominal pain.   Objective:  Vital signs in last 24 hours: Vitals:   12/05/17 1230 12/05/17 1257 12/05/17 1700 12/05/17 2100  BP: (!) 180/113 (!) 164/107 (!) 158/99 (!) 153/94  Pulse: 81 93 88 85  Resp: 17 (!) 24    Temp:  97.7 F (36.5 C) 98 F (36.7 C) 98.2 F (36.8 C)  TempSrc:  Oral Oral Oral  SpO2:  97% 98% 98%  Weight:  164 lb 3.9 oz (74.5 kg)  164 lb 3.9 oz (74.5 kg)   Physical Exam  Constitutional: He appears well-developed and well-nourished. No distress.  HENT:  Head: Normocephalic and atraumatic.  Eyes: Conjunctivae are normal.  Cardiovascular: Normal rate, regular rhythm and normal heart sounds.  Respiratory: Effort normal and breath sounds normal. No respiratory distress. He has no wheezes.  GI: Soft. Bowel sounds are normal. He exhibits no distension. Tenderness: left sided.  Musculoskeletal: He exhibits no edema.  Neurological: He is alert.  Skin: He is not diaphoretic. No erythema.  Psychiatric: He has a normal mood and affect. His behavior is normal. Judgment and thought content normal.   Assessment/Plan:  Frank [name] is a 52 y.o m with chronic pancreatitiswith recent stent placement for an infected pancreatic pseudocyst, combined heart failure(EF=35-40%,diffuse hypokinesis, pa press=38), ESRD on HD, hx of DVT/PE on eliquis who presented with acute on chronic abdominal pain, nausea and vomiting.Patient's symptoms are thought to be due to an acute worsening of his chronic pancreatitis vs possible withdrawal of opioid medication.   Acute on chronic pancreatitis The patient continues to state that he has left sided abdominal pain. He has used all 4 doses of available IV dilaudid. Transitioned to oral 56m dilaudid q6hrs prn  -Changed from zofran to IV phenergan 254m q6hrs prn -Renal diet with fluid restriction to 1500 -limiting fluids due to patient also having ESRD  -GI recommended follow up with [facility] outpatient for stent removal  Hypoglycemia  The patient's blood glucose measurements have improved and have been ranging 70-100 over the past 24 hrs. The patient was given two doses of D50 yesterday morning.   -Encouraged oral intake -Amps of D50 prn  Hypertension Patient's blood pressurethis morning was noted to be 153/94.  -Hydralazine 5022m8hrs  -lisinopril 44m 24m -IV hydralazine 10-20mg644mrs prn  ESRD on MWF HD Hyperkalemia  Renal mass Patient is due for dialysis today, but he refused this morning 5/18. Nephrology plans to do extra dialysis today 5/18 and then resume mwf schedule  -continue cinacalcet, doxercalciferol, renvela  History of DVT/PE -continue eliquis  Dispo: Anticipated discharge in approximately 0-1 day(s).   Frank Mage5/18/2019, 11:29 AM Pager: [phone]

## 2017-12-06 NOTE — Discharge Summary (Signed)
Name: Frank [name] MRN: [id number] DOB: 11-Jun-1966 52 y.o. PCP: Patient, [name]  Date of Admission: 12/01/2017  9:39 AM Date of Discharge: 12/07/2017 Attending Physician: Dr. Lalla Brothers   Discharge Diagnosis: 1. Acute on Chronic Pancreatitis 2. ESRD on HD  Discharge Medications: Allergies as of 12/07/2017      Reactions   Butalbital-apap-caffeine Shortness Of Breath, Swelling, Other (See Comments)   Swelling in throat   Ferrlecit [na Ferric Gluc Cplx In Sucrose] Shortness Of Breath, Swelling, Other (See Comments)   Swelling in throat, tolerates Venofor   Minoxidil Shortness Of Breath   Tylenol [acetaminophen] Anaphylaxis, Swelling   Darvocet [propoxyphene N-acetaminophen] Hives      Medication List    STOP taking these medications   HYDROcodone-acetaminophen 5-325 MG tablet Commonly known as:  NORCO/VICODIN   oxyCODONE 5 MG immediate release tablet Commonly known as:  ROXICODONE     TAKE these medications   ammonium lactate 12 % lotion Commonly known as:  LAC-HYDRIN Apply topically as needed for dry skin.   apixaban 5 MG Tabs tablet Commonly known as:  ELIQUIS Take 1 tablet (5 mg total) by mouth 2 (two) times daily.   cinacalcet 30 MG tablet Commonly known as:  SENSIPAR Take 3 tablets (90 mg total) by mouth daily with supper.   Darbepoetin Alfa 60 MCG/0.3ML Sosy injection Commonly known as:  ARANESP Inject 0.3 mLs (60 mcg total) into the vein every Wednesday with hemodialysis.   diclofenac sodium 1 % Gel Commonly known as:  VOLTAREN Apply 2 g topically 4 (four) times daily as needed.   HYDROmorphone 4 MG tablet Commonly known as:  DILAUDID Take 1 tablet (4 mg total) by mouth every 6 (six) hours as needed for up to 5 days for moderate pain or severe pain.   lisinopril 5 MG tablet Commonly known as:  PRINIVIL,ZESTRIL Take 1 tablet (5 mg total) by mouth daily.   multivitamin Tabs tablet Take 1 tablet by mouth at bedtime.   ondansetron 8 MG  disintegrating tablet Commonly known as:  ZOFRAN-ODT Take 1 tablet (8 mg total) by mouth every 8 (eight) hours as needed for nausea or vomiting.   sevelamer carbonate 800 MG tablet Commonly known as:  RENVELA Take 3 tablets (2,400 mg total) by mouth 3 (three) times daily with meals.       Disposition and follow-up:   [name] was discharged from [facility] in Stable condition.  At the hospital follow up visit please address:  1.  Acute on Chronic Pancreatitis: Improved with bowel rest and pain control. He was discharged with a short course of PRN zofran and PO dilaudid to bridge him until his follow up at [facility] for stent removal. Previously prescribed oxycodone which is not ideal given his ESRD. Please consider creon for treatment of his chronic pancreatitis.   2. ESRD on HD: Received urgent HD on admission 5/13 and again 5/16 while hospitalized. Instructed to resume normal M/W/F schedule on discharge.   3.  Labs / imaging needed at time of follow-up: None  4.  Pending labs/ test needing follow-up: None  Follow-up Appointments: Follow-up Information    Medicine, [facility] Follow up.   Why:  You can call and make appoitnment to establish a Primary Care Provider Contact information: [place] [address] [phone]        [place]. Call.   Why:  You can call and make appoitnment to establish  a Primary Care Provider     Contact information: [address] [place] Hospital Course by problem list:  1. Acute on Chronic Pancreatitis:  Patient has chronic abdominal pain due to chronic pancreatitis and likely a left exophytic kidney mass for which he is planning total nephrectomy. He was recently admitted at [facility] for MRSA infected pseudocyst requiring stenting and conduit placement. This was followed by hospitalization at [facility] and [facility] in April for  chest pain and missed dialysis sessions. He presented this admission with chest pain, SOB, LLQ abdominal pain, nausea, vomiting, and diarrhea. CT abdomen on admission demonstrated his previously placed stent with resolution of his pancreatic fluid collection. Otherwise no evidence of acute inflammatory process. He was afebrile and his labs were reassuring. GI was consulted by the EDP who recommended outpatient follow up at [facility] for removal of his stent. Given his symptoms, he was admitted and treated empirically for acute on chronic pancreatitis with bowel rest and IV dilaudid. This was complicated by recurrent hypoglycemia requiring intermittent amps of D50, likely due to his chronic pancreatitis and glucagon deficient state. Diet was slowly advanced and hypoglycemia improved. He was tolerating PO on discharge without nausea or vomiting. It is possible there was also a component of opioid withdrawal contributing as many of his symptoms improved rapidly with IV dilaudid. He was discharged with a short course of PRN zofran and PO dilaudid to bridge him until his follow up at [facility] for stent removal. He was previously on oxycodone which is not ideal given his ESRD.   2. ESRD on HD M/W/F: Patient presented with chest pain and SOB. Initially arrived to his outpatient dialysis unit and subsequently sent to the ED. He was volume overloaded on arrival with worsening of his right sided pleural effusion on imaging. He was hypertensive with BP 190/121 and hyperkalemic with potassium 6.6. He received urgent HD that evening. For his pleural effusion, patient had received two thoracenteses in April, one at [facility] and one at [facility], both consistent with transudative processes. Repeat thoracentesis was not performed this admission. He was treated empirically with dialysis. He received another session of HD on Thursday 5/16 with plans for an additional session on Saturday 5/18 to get him back on his normal M/W/F schedule. Patient  declined the last session of dialysis. Instructed to resume his normal dialysis schedule on discharge.   Discharge Vitals:   BP (!) 167/110 (BP Location: Right Arm)   Pulse 94   Temp 97.8 F (36.6 C) (Oral)   Resp 16   Wt 164 lb 3.9 oz (74.5 kg)   SpO2 100%   BMI 25.72 kg/m   Pertinent Labs, Studies, and Procedures:   12/01/2017 2 View Chest Xray: IMPRESSION: Increased volume of the right pleural effusion, increased pulmonary interstitial edema, and increased pulmonary vascular congestion are consistent with worsening CHF.  Thoracic aortic atherosclerosis.  12/01/2017 CT Abdomen & Pelvis: IMPRESSION: Newly seen right pleural effusion with dependent atelectasis. Chronic cardiomegaly and pericardial effusion.  Resolution of previously seen lesser sac fluid collection.  Apparent ingested object within the stomach which looks like a spool. Maximal dimension is 2.8 cm  No acute abnormality otherwise identified.   Discharge Instructions: Discharge Instructions    Call MD for:  persistant nausea and vomiting   Complete by:  As directed    Call MD for:  severe uncontrolled pain   Complete by:  As directed  Diet - low sodium heart healthy   Complete by:  As directed    Discharge instructions   Complete by:  As directed    [name],  I am glad you are feeling better. I have given you a prescription for pain and nausea medications. Please pick these up from your pharmacy. You may continue your other medications as previously prescribed. Please follow up with your gastroenterologist at [facility]. It might be a good idea to give them a call and follow up sooner than 3 weeks. Please let them know you were in the hospital.   Please follow up with your outpatient dialysis center for your dialysis needs. You may resume your normal M/W/F schedule tomorrow.   Thank you,  Dr. Philipp Ovens   Discharge patient   Complete by:  As directed    Discharge disposition:  01-Home or Self Care     Discharge patient date:  12/07/2017   Increase activity slowly   Complete by:  As directed       Signed: Velna Ochs, MD 12/07/2017, 12:11 PM   Pager: [phone]

## 2017-12-06 NOTE — Progress Notes (Signed)
Attempted to get the patient fot tx in the morning and he refused citing that his stomach was hurting and he needed to eat breakfast; reached again during second shift and patient stated he would come at 1730. When transport went to pick him up at 1730 he stated he did not want to come because his daughter was coming to see him. I spoke to the patient and stated that he did not want to come bacause his daughter was bringing him something. I told him to tell the daughter to bring whatever it was to the HD unit and he stated that she did not know her around [facility]. He then stated that did not want to come today for HD.

## 2017-12-06 NOTE — Progress Notes (Signed)
MD made aware of the pt refusal to have HD.

## 2017-12-06 NOTE — Progress Notes (Addendum)
Frank [name] Progress Note   Dialysis Orders:  [facility]  4h  400/800  73.5kg   2/2 bath  Heparin none  Left arm avf  Frank [name] 8 ug tiw  mircera 225 every 2 wks last 4/29 (due 5/13)  Assessment/Plan: 1  Vol overload/ SOB/ pulm edema - net UF 2.5 5/17 with post wt 74.5- has HD orders again for Saturday 2  Abd pain - suspected acute pancreatitis, per primary team 3  SP recent stent drainage of infect pseudocyst at [facility] 4  esrd on HD mwf - extra tmt Sat- and then back on MWF schedule - draw labs pre HD 5  HTN - bp's improving will ^lisinopril 10 qd at HS start tonight cont hydral 50 tid - lower volume as above- would expect him to lose wt with acute pancreatitis and poor intake - challenge volume to get below EDW 6  Anemia - sp prbc x 1 here hgb 9.2 7  Mbd ckd - cont meds P elevated in spite of binders but med list shows only taking at supper and intake poor overall.  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 Nutrition alb 2.8  [name] [phone] 12/06/2017,9:19 AM  LOS: 4 days   Pt seen, examined and agree w A/P as above.  Frank Splinter MD [name] pager [phone]   12/06/2017, 12:34 PM    Subjective:   No appetite - vomits after he eats. Belly hurts. Declined first shift HD today.  Objective Vitals:   12/05/17 1230 12/05/17 1257 12/05/17 1700 12/05/17 2100  BP: (!) 180/113 (!) 164/107 (!) 158/99 (!) 153/94  Pulse: 81 93 88 85  Resp: 17 (!) 24    Temp:  97.7 F (36.5 C) 98 F (36.7 C) 98.2 F (36.8 C)  TempSrc:  Oral Oral Oral  SpO2:  97% 98% 98%  Weight:  74.5 kg (164 lb 3.9 oz)  74.5 kg (164 lb 3.9 oz)   Physical Exam General: NAD  Heart: RRR 2/6 murmur Lungs: grossly clear Abdomen: soft LUQ  tenderness Extremities: tr -1 + LE edema Dialysis Access: left lower AVF+ bruit   Additional Objective Labs: Basic Metabolic Panel: Recent Labs  Lab 12/03/17 0529 12/04/17 0824 12/05/17 0900  NA 136 136 138  K 5.0 5.5* 5.3*  CL 95* 94* 98*  CO2 _0 GLUCOSE 49* 125* 68  BUN 44* 58* 66*  CREATININE 8.43* 9.90* 11.67*  CALCIUM 9.8 9.0 8.5*  PHOS  --  8.0* 7.4*   Liver Function Tests: Recent Labs  Lab 12/01/17 1101 12/04/17 0824 12/05/17 0900  AST 38  --   --   ALT 19  --   --   ALKPHOS 127*  --   --   BILITOT 1.5*  --   --   PROT 8.5*  --   --   ALBUMIN 2.9* 2.9* 2.8*   Recent Labs  Lab 12/01/17 1101  LIPASE 85*   CBC: Recent Labs  Lab 12/01/17 1101  12/03/17 0529 12/04/17 0824 12/05/17 0902  WBC 5.8  --  5.4 6.1 4.8  HGB 7.2*   < > 9.2* 8.7* 9.2*  HCT 22.2*   < > 29.2* 27.7* 28.6*  MCV 83.8  --  85.9 85.2 83.6  PLT 245  --  202 196 216   < > = values in this interval not displayed.   Blood Culture    Component Value Date/Time   SDES NASOPHARYNGEAL 09/22/2017 0723   SPECREQUEST NONE 09/22/2017 0723   CULT  09/22/2017 0723    NO MRSA DETECTED Performed at [name], [place] [address] [place], [place] [address]    REPTSTATUS 09/23/2017 FINAL 09/22/2017 0723    Cardiac Enzymes: No results for input(s): CKTOTAL, CKMB, CKMBINDEX, TROPONINI in the last 168 hours. CBG: Recent Labs  Lab 12/04/17 2045 12/05/17 0721 12/05/17 1338 12/05/17 1623 12/06/17 0725  GLUCAP 90 74 109* 72 106*   Iron Studies: No results for input(s): IRON, TIBC, TRANSFERRIN, FERRITIN in the last 72 hours. Lab Results  Component Value Date   INR 1.56 09/21/2017   INR 1.28 09/04/2017   INR 1.13 09/03/2017   Studies/Results: No results found. Medications: . sodium chloride     . apixaban  5 mg Oral BID  . cinacalcet  90 mg Oral Q supper  . doxercalciferol  8 mcg Intravenous Q M,W,F-HD  . hydrALAZINE  50 mg Oral Q8H  . lisinopril  5 mg Oral Daily  .  multivitamin  1 tablet Oral QHS  . sevelamer carbonate  2,400 mg Oral TID WC

## 2017-12-06 NOTE — Progress Notes (Signed)
Pt's bed is up in the air. RN started to lower to lowest position and pt stated " I dont want my bed lowered, don't start with me." Bed was left where pt wanted it.   Eleanora Neighbor, RN

## 2017-12-06 NOTE — Progress Notes (Signed)
Patient refused Hemo when transport got on the unit and is now wanting to go in the evening time. MD aware, and will continue to monitor.   De Nurse, RN

## 2017-12-07 MED ORDER — ONDANSETRON 8 MG PO TBDP: 8.0000 mg | ORAL_TABLET | Freq: Three times a day (TID) | ORAL | 0 refills | Status: DC | PRN

## 2017-12-07 NOTE — Progress Notes (Signed)
Spoke w/ primary nurse, Perry Mount, RN to let her know that per MD pt's tx for Sunday has been d/c'd

## 2017-12-07 NOTE — Progress Notes (Signed)
Pt keeps saying he is nauseous wanting his IV phenergan, but is also asking for food at the same time. Pt called this RN stating he needed Phenergan, Dilaudid, and cereal. This RN took pt one cereal and told pt that he did not need this if he felt nauseous and this RN also told pt that he did not have dialysis and the milk had phosphorous in it. Pt stated he wanted to eat it anyway and continued to pour several packs of sugar on it that he already had. Pt then stated that he did not want the phenergan until after he ate. This RN asked how he could eat if he was about to vomit. Pt stated to come back in 15 minutes to give his meds. This RN told pt that he needs to let the staff see if he actually vomits.   Eleanora Neighbor, RN

## 2017-12-07 NOTE — Progress Notes (Signed)
Patient discharged to home. Patient AVS reviewed and signed. Patient capable re-verbalizing medications and follow-up appointments. IV removed. Patient belongings sent with patient. Patient educated to return to the ED in the event of SOB, chest pain or dizziness.   De Nurse, RN

## 2017-12-07 NOTE — Progress Notes (Addendum)
[facility] Progress Note   Dialysis Orders: [facility] 4h 400/800 73.5kg 2/2 bath Heparin none Left arm avf hectorol 8 ug tiw mircera 225 every 2 wks last 4/29 (due 5/13)  Assessment/Plan: 1 Vol overload/ SOB/ pulm edema -net UF 2.5 5/17 with post wt 74.5- had HD orders again for Saturday but declined -his next HD will be Monday at his outpt HD unit 2 Abd pain - suspected acute pancreatitis, improved 3 SP recent stent drainage of infected pseudocyst at [place] 4 Esrd on HD mwf- refused extra tmt Sat- and then back on MWF schedule  5 HTN - Lisinopril ^ to 10 qdat HS starting 5/18cont hydral 50 tid - lower volume as above- would expect him to lose wt with acute pancreatitis and poor intake - unfortunately refusal of HD extra tmt obviates volume the fluid removal necessary to bring down BP - continue to titrate EDW down 6 Anemia - sp prbc x 1 5/13herehgb 9.2 5/17 7 Mbd ckd - cont medsP elevated in spite of binders but med list shows only taking at supper and intake poor overall.  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 Nutrition alb 2.8   [place] 12/07/2017,9:58 AM  LOS: 5 days   Pt seen, examined and agree w A/P as above. Going home today.   Kelly Splinter MD [facility] pager [phone]   12/07/2017, 1:01 PM    Subjective:  Feeling better - plans to go to HD tomorrow after d/c  Objective Vitals:   12/06/17 1713 12/06/17 2204 12/07/17 0533 12/07/17 0906  BP: (!) 156/116 (!) 154/109 (!) 159/110 (!) 167/110  Pulse: 90 94 91 94  Resp: _0 Temp: 98 F (36.7 C) 98 F (36.7 C) 97.6 F (36.4 C) 97.8 F (36.6 C)  TempSrc: Oral Oral Oral Oral  SpO2: 96% 97% 97% 100%   Weight:       Physical Exam General: NAD sitting on side of bed Heart: RRR 2/5 murmur Lungs: grossly clear Abdomen: somewhat distended soft LUQ tenderness  Extremities: 1+ LE edema Dialysis Access:  Left AVF + bruit   Additional Objective Labs: Basic Metabolic Panel: Recent Labs  Lab 12/03/17 0529 12/04/17 0824 12/05/17 0900  NA 136 136 138  K 5.0 5.5* 5.3*  CL 95* 94* 98*  CO2 _1 GLUCOSE 49* 125* 68  BUN 44* 58* 66*  CREATININE 8.43* 9.90* 11.67*  CALCIUM 9.8 9.0 8.5*  PHOS  --  8.0* 7.4*   Liver Function Tests: Recent Labs  Lab 12/01/17 1101 12/04/17 0824 12/05/17 0900  AST 38  --   --   ALT 19  --   --   ALKPHOS 127*  --   --   BILITOT 1.5*  --   --   PROT 8.5*  --   --   ALBUMIN 2.9* 2.9* 2.8*   Recent Labs  Lab 12/01/17 1101  LIPASE 85*   CBC: Recent Labs  Lab 12/01/17 1101  12/03/17 0529 12/04/17 0824 12/05/17 0902  WBC 5.8  --  5.4 6.1 4.8  HGB 7.2*   < > 9.2* 8.7* 9.2*  HCT 22.2*   < > 29.2* 27.7* 28.6*  MCV  83.8  --  85.9 85.2 83.6  PLT 245  --  202 196 216   < > = values in this interval not displayed.   Blood Culture    Component Value Date/Time   SDES NASOPHARYNGEAL 09/22/2017 0723   SPECREQUEST NONE 09/22/2017 0723   CULT  09/22/2017 0723    NO MRSA DETECTED Performed at [facility], [place] [address], [place], [place] [address]    REPTSTATUS 09/23/2017 FINAL 09/22/2017 0723    Cardiac Enzymes: No results for input(s): CKTOTAL, CKMB, CKMBINDEX, TROPONINI in the last 168 hours. CBG: Recent Labs  Lab 12/05/17 1623 12/06/17 0725 12/06/17 1133 12/06/17 1648 12/06/17 2203  GLUCAP 72 106* 79 101* 94   Iron Studies: No results for input(s): IRON, TIBC, TRANSFERRIN, FERRITIN in the last 72 hours. Lab Results  Component Value Date   INR 1.56 09/21/2017   INR 1.28 09/04/2017   INR 1.13 09/03/2017   Studies/Results: No results found. Medications: . sodium chloride     . apixaban  5 mg Oral BID  .  cinacalcet  90 mg Oral Q supper  . doxercalciferol  8 mcg Intravenous Q M,W,F-HD  . hydrALAZINE  50 mg Oral Q8H  . lisinopril  10 mg Oral Q2200  . multivitamin  1 tablet Oral QHS  . sevelamer carbonate  2,400 mg Oral TID WC

## 2017-12-07 NOTE — Progress Notes (Signed)
Perry Mount called and made aware that the pt HD tx will de done on 12/07/2017

## 2017-12-07 NOTE — Progress Notes (Signed)
Pt after visit summary reviewed. IV removed. Patient prepared for discharge. Dorthey Sawyer, RN

## 2017-12-07 NOTE — Progress Notes (Signed)
Subjective: When seen on rounds yesterday morning, patient was doing well aside from nausea unrelieved with IV zofran. Pain was controlled with dilaudid. He ate 100% of his dinner the evening prior without emesis. We discussed plans to transition his IV dilaudid to PO in anticipation of discharge after HD. We also changed his zofran to phenergan for better control. He later declined hemodialysis due to pain & nausea and declined breakfast. Symptoms improved again yesterday evening and he was again able to eat 100% of his dinner.  This morning on rounds, patient was seen ambulating the halls. He reports feeling much better and is ready for discharge. He drank apple juice this morning and ordered breakfast which was on the way.   Objective:  Vital signs in last 24 hours: Vitals:   12/06/17 1358 12/06/17 1713 12/06/17 2204 12/07/17 0533  BP: (!) 161/116 (!) 156/116 (!) 154/109 (!) 159/110  Pulse:  90 94 91  Resp:  _0 Temp:  98 F (36.7 C) 98 F (36.7 C) 97.6 F (36.4 C)  TempSrc:  Oral Oral Oral  SpO2:  96% 97% 97%  Weight:       Physical Exam Constitutional: NAD, appears comfortable Cardiovascular: RRR, no murmurs, rubs, or gallops.  Pulmonary/Chest: CTAB, breathing comfortably on RA Abdominal: Soft, diffusely tender, +BS Skin: No rashes or erythema   Assessment/Plan:  Frank [name] is a 52 y.o m with chronic pancreatitiswith recent stent placement for an infected pancreatic pseudocyst, combined heart failure(EF=35-40%,diffuse hypokinesis, pa press=38), ESRD on HD, hx of DVT/PE on eliquis who presented with acute on chronic abdominal pain, nausea and vomiting.Patient's symptoms are thought to be due to an acute worsening of his chronic pancreatitisvs possible withdrawal of opioid medication.  Chronic Abdominal Pain ? Acute on Chronic Pancreatitis Patient has chronic abdominal pain due to chronic pancreatitis and likely a left exophytic kidney mass for which he is planning  total nephrectomy. He presented with LLQ abdominal pain, nausea, and vomiting. CT on admission demonstrated his previously placed stent with resolution of his pancreatic fluid collection. Otherwise no evidence of acute inflammatory process. His labs were reassuring. Given his symptoms, he was treated empirically for acute on chronic pancreatitis with bowel rest and IV dilaudid. This was complicated by recurrent hypoglycemia requiring intermittent amps of D50, likely due to his chronic pancreatitis and glucagon deficient state. Diet has been slowly advanced. Hypoglycemia has improved. He is now tolerating food without emesis, ate 100% of his dinner for the past two nights and drank apple juice this morning. His IV dilaudid was transitioned to PO yesterday in anticipation for discharge. Reports pain is controlled and he is ready for discharge.  -- Discharge today  -- Follow up [facility] for stent removal   ESRD on HD:  Patient has intermittently declined HD this admission. Original plan was for discharge after HD yesterday but patient developed abdominal pain and nausea and refused to go. He is normally M/W/F schedule. If no plans for dialysis today, will plan to discharge and resumed normal schedule tomorrow.  -- Follow up nephro recommendations   HTN: BP uncontrolled due to being volume overloaded and missing extra HD session yesterday. Nephrology has increased his lisinopril to 10 mg daily. -- Continue hydralazine 50 mg q8h -- Continue lisinopril 10 mg daily  -- IV hydralazine 10-20 mg q6h prn   Hx DVT/PD -- Continue eliquis   FEN: Fluid restrict, lytes per HD, renal diet VTE ppx: Eliquis  Code Status: FULL  Dispo: Anticipated  discharge today.   Frank Ochs, MD 12/07/2017, 7:21 AM Pager: [phone]

## 2017-12-08 DIAGNOSIS — D509 Iron deficiency anemia, unspecified: Secondary | ICD-10-CM | POA: Diagnosis not present

## 2017-12-08 DIAGNOSIS — Z992 Dependence on renal dialysis: Secondary | ICD-10-CM | POA: Diagnosis not present

## 2017-12-08 DIAGNOSIS — N2581 Secondary hyperparathyroidism of renal origin: Secondary | ICD-10-CM | POA: Diagnosis not present

## 2017-12-08 DIAGNOSIS — B9562 Methicillin resistant Staphylococcus aureus infection as the cause of diseases classified elsewhere: Secondary | ICD-10-CM | POA: Diagnosis not present

## 2017-12-08 DIAGNOSIS — Z5181 Encounter for therapeutic drug level monitoring: Secondary | ICD-10-CM | POA: Diagnosis not present

## 2017-12-08 DIAGNOSIS — D631 Anemia in chronic kidney disease: Secondary | ICD-10-CM | POA: Diagnosis not present

## 2017-12-08 DIAGNOSIS — N186 End stage renal disease: Secondary | ICD-10-CM | POA: Diagnosis not present

## 2017-12-10 ENCOUNTER — Other Ambulatory Visit: Payer: Self-pay

## 2017-12-10 ENCOUNTER — Encounter (HOSPITAL_COMMUNITY): Payer: Self-pay

## 2017-12-10 ENCOUNTER — Emergency Department (HOSPITAL_COMMUNITY)
Admission: EM | Admit: 2017-12-10 | Discharge: 2017-12-11 | Disposition: A | Discharge status: Home / Self Care | Payer: Medicare Other | Source: Home / Self Care | Attending: Emergency Medicine | Admitting: Emergency Medicine

## 2017-12-10 ENCOUNTER — Emergency Department (HOSPITAL_COMMUNITY)
Admission: EM | Admit: 2017-12-10 | Discharge: 2017-12-10 | Discharge status: Against Medical Advice | Payer: Medicare Other | Attending: Emergency Medicine | Admitting: Emergency Medicine

## 2017-12-10 ENCOUNTER — Emergency Department (HOSPITAL_COMMUNITY): Payer: Medicare Other

## 2017-12-10 ENCOUNTER — Encounter (HOSPITAL_COMMUNITY): Payer: Self-pay | Admitting: Emergency Medicine

## 2017-12-10 ENCOUNTER — Ambulatory Visit (INDEPENDENT_AMBULATORY_CARE_PROVIDER_SITE_OTHER): Payer: Medicare Other | Admitting: Nurse Practitioner

## 2017-12-10 DIAGNOSIS — Z79899 Other long term (current) drug therapy: Secondary | ICD-10-CM | POA: Insufficient documentation

## 2017-12-10 DIAGNOSIS — Z5321 Procedure and treatment not carried out due to patient leaving prior to being seen by health care provider: Secondary | ICD-10-CM | POA: Diagnosis not present

## 2017-12-10 DIAGNOSIS — Z86718 Personal history of other venous thrombosis and embolism: Secondary | ICD-10-CM | POA: Diagnosis not present

## 2017-12-10 DIAGNOSIS — Z87891 Personal history of nicotine dependence: Secondary | ICD-10-CM | POA: Insufficient documentation

## 2017-12-10 DIAGNOSIS — N186 End stage renal disease: Secondary | ICD-10-CM | POA: Insufficient documentation

## 2017-12-10 DIAGNOSIS — R0602 Shortness of breath: Secondary | ICD-10-CM | POA: Insufficient documentation

## 2017-12-10 DIAGNOSIS — Z940 Kidney transplant status: Secondary | ICD-10-CM

## 2017-12-10 DIAGNOSIS — I120 Hypertensive chronic kidney disease with stage 5 chronic kidney disease or end stage renal disease: Secondary | ICD-10-CM | POA: Diagnosis not present

## 2017-12-10 DIAGNOSIS — G8929 Other chronic pain: Secondary | ICD-10-CM | POA: Diagnosis not present

## 2017-12-10 DIAGNOSIS — D631 Anemia in chronic kidney disease: Secondary | ICD-10-CM | POA: Diagnosis not present

## 2017-12-10 DIAGNOSIS — R109 Unspecified abdominal pain: Secondary | ICD-10-CM | POA: Diagnosis not present

## 2017-12-10 DIAGNOSIS — Z992 Dependence on renal dialysis: Secondary | ICD-10-CM

## 2017-12-10 DIAGNOSIS — I132 Hypertensive heart and chronic kidney disease with heart failure and with stage 5 chronic kidney disease, or end stage renal disease: Secondary | ICD-10-CM | POA: Insufficient documentation

## 2017-12-10 DIAGNOSIS — F419 Anxiety disorder, unspecified: Secondary | ICD-10-CM

## 2017-12-10 DIAGNOSIS — Z7901 Long term (current) use of anticoagulants: Secondary | ICD-10-CM | POA: Insufficient documentation

## 2017-12-10 DIAGNOSIS — R1012 Left upper quadrant pain: Secondary | ICD-10-CM | POA: Insufficient documentation

## 2017-12-10 DIAGNOSIS — N2581 Secondary hyperparathyroidism of renal origin: Secondary | ICD-10-CM | POA: Diagnosis not present

## 2017-12-10 DIAGNOSIS — D509 Iron deficiency anemia, unspecified: Secondary | ICD-10-CM | POA: Diagnosis not present

## 2017-12-10 DIAGNOSIS — I5022 Chronic systolic (congestive) heart failure: Secondary | ICD-10-CM | POA: Insufficient documentation

## 2017-12-10 DIAGNOSIS — Z5181 Encounter for therapeutic drug level monitoring: Secondary | ICD-10-CM | POA: Diagnosis not present

## 2017-12-10 DIAGNOSIS — E1122 Type 2 diabetes mellitus with diabetic chronic kidney disease: Secondary | ICD-10-CM | POA: Insufficient documentation

## 2017-12-10 DIAGNOSIS — B9562 Methicillin resistant Staphylococcus aureus infection as the cause of diseases classified elsewhere: Secondary | ICD-10-CM | POA: Diagnosis not present

## 2017-12-10 LAB — CBC
HCT: 30 % — ABNORMAL LOW (ref 39.0–52.0)
HEMOGLOBIN: 9.5 g/dL — AB (ref 13.0–17.0)
MCH: 27.1 pg (ref 26.0–34.0)
MCHC: 31.7 g/dL (ref 30.0–36.0)
MCV: 85.7 fL (ref 78.0–100.0)
PLATELETS: 159 10*3/uL (ref 150–400)
RBC: 3.5 MIL/uL — ABNORMAL LOW (ref 4.22–5.81)
RDW: 21 % — ABNORMAL HIGH (ref 11.5–15.5)
WBC: 4.3 10*3/uL (ref 4.0–10.5)

## 2017-12-10 NOTE — ED Triage Notes (Signed)
Patient states he was getting dialysis today and did not finish his treatment due to the abdominal pain.  NVD and some shortness of breath.

## 2017-12-10 NOTE — ED Notes (Signed)
Pt refusing blood work at this time. Transported to XR

## 2017-12-10 NOTE — ED Notes (Signed)
Pt called in lobby for blood work request. No answer. RN notified. Nurse First stated pt left after being told longest wait time.

## 2017-12-10 NOTE — ED Triage Notes (Signed)
Pt states that he was getting dialysis today and could not fiish treatment r/t NVD and SOB.

## 2017-12-11 DIAGNOSIS — N2889 Other specified disorders of kidney and ureter: Secondary | ICD-10-CM | POA: Diagnosis not present

## 2017-12-11 DIAGNOSIS — Z940 Kidney transplant status: Secondary | ICD-10-CM | POA: Diagnosis not present

## 2017-12-11 DIAGNOSIS — K5730 Diverticulosis of large intestine without perforation or abscess without bleeding: Secondary | ICD-10-CM | POA: Diagnosis not present

## 2017-12-11 DIAGNOSIS — J984 Other disorders of lung: Secondary | ICD-10-CM | POA: Diagnosis not present

## 2017-12-11 DIAGNOSIS — Z8719 Personal history of other diseases of the digestive system: Secondary | ICD-10-CM | POA: Diagnosis not present

## 2017-12-11 DIAGNOSIS — I5042 Chronic combined systolic (congestive) and diastolic (congestive) heart failure: Secondary | ICD-10-CM | POA: Diagnosis not present

## 2017-12-11 DIAGNOSIS — Z86711 Personal history of pulmonary embolism: Secondary | ICD-10-CM | POA: Diagnosis not present

## 2017-12-11 DIAGNOSIS — I313 Pericardial effusion (noninflammatory): Secondary | ICD-10-CM | POA: Diagnosis not present

## 2017-12-11 DIAGNOSIS — K761 Chronic passive congestion of liver: Secondary | ICD-10-CM | POA: Diagnosis not present

## 2017-12-11 DIAGNOSIS — K219 Gastro-esophageal reflux disease without esophagitis: Secondary | ICD-10-CM | POA: Diagnosis not present

## 2017-12-11 DIAGNOSIS — J90 Pleural effusion, not elsewhere classified: Secondary | ICD-10-CM | POA: Diagnosis not present

## 2017-12-11 DIAGNOSIS — I132 Hypertensive heart and chronic kidney disease with heart failure and with stage 5 chronic kidney disease, or end stage renal disease: Secondary | ICD-10-CM | POA: Diagnosis not present

## 2017-12-11 DIAGNOSIS — D649 Anemia, unspecified: Secondary | ICD-10-CM | POA: Diagnosis not present

## 2017-12-11 DIAGNOSIS — I509 Heart failure, unspecified: Secondary | ICD-10-CM | POA: Diagnosis not present

## 2017-12-11 DIAGNOSIS — N3289 Other specified disorders of bladder: Secondary | ICD-10-CM | POA: Diagnosis not present

## 2017-12-11 DIAGNOSIS — J918 Pleural effusion in other conditions classified elsewhere: Secondary | ICD-10-CM | POA: Diagnosis not present

## 2017-12-11 DIAGNOSIS — I517 Cardiomegaly: Secondary | ICD-10-CM | POA: Diagnosis not present

## 2017-12-11 DIAGNOSIS — Z9889 Other specified postprocedural states: Secondary | ICD-10-CM | POA: Diagnosis not present

## 2017-12-11 DIAGNOSIS — Z992 Dependence on renal dialysis: Secondary | ICD-10-CM | POA: Diagnosis not present

## 2017-12-11 DIAGNOSIS — R0600 Dyspnea, unspecified: Secondary | ICD-10-CM | POA: Diagnosis not present

## 2017-12-11 DIAGNOSIS — K567 Ileus, unspecified: Secondary | ICD-10-CM | POA: Diagnosis not present

## 2017-12-11 DIAGNOSIS — N186 End stage renal disease: Secondary | ICD-10-CM | POA: Diagnosis not present

## 2017-12-11 DIAGNOSIS — J811 Chronic pulmonary edema: Secondary | ICD-10-CM | POA: Diagnosis not present

## 2017-12-11 LAB — COMPREHENSIVE METABOLIC PANEL
ALK PHOS: 125 U/L (ref 38–126)
ALT: 15 U/L — ABNORMAL LOW (ref 17–63)
ANION GAP: 12 (ref 5–15)
AST: 32 U/L (ref 15–41)
Albumin: 2.8 g/dL — ABNORMAL LOW (ref 3.5–5.0)
BILIRUBIN TOTAL: 1.1 mg/dL (ref 0.3–1.2)
BUN: 39 mg/dL — ABNORMAL HIGH (ref 6–20)
CALCIUM: 9.1 mg/dL (ref 8.9–10.3)
CO2: 25 mmol/L (ref 22–32)
Chloride: 102 mmol/L (ref 101–111)
Creatinine, Ser: 8.58 mg/dL — ABNORMAL HIGH (ref 0.61–1.24)
GFR calc non Af Amer: 6 mL/min — ABNORMAL LOW (ref >60)
GFR, EST AFRICAN AMERICAN: 7 mL/min — AB (ref >60)
Glucose, Bld: 73 mg/dL (ref 65–99)
POTASSIUM: 4.8 mmol/L (ref 3.5–5.1)
Sodium: 139 mmol/L (ref 135–145)
TOTAL PROTEIN: 8 g/dL (ref 6.5–8.1)

## 2017-12-11 LAB — PROTIME-INR

## 2017-12-11 LAB — LIPASE, BLOOD: Lipase: 72 U/L — ABNORMAL HIGH (ref 11–51)

## 2017-12-11 MED ORDER — ONDANSETRON 4 MG PO TBDP
4.0000 mg | ORAL_TABLET | Freq: Once | ORAL | Status: AC
Start: 2017-12-11 — End: 2017-12-11
  Administered 2017-12-11: 4 mg via ORAL
  Filled 2017-12-11: qty 1

## 2017-12-11 MED ORDER — HYDROMORPHONE HCL 2 MG/ML IJ SOLN
1.0000 mg | Freq: Once | INTRAMUSCULAR | Status: AC
  Administered 2017-12-11: 1 mg via INTRAVENOUS
  Filled 2017-12-11: qty 1

## 2017-12-11 MED ORDER — PROMETHAZINE HCL 25 MG/ML IJ SOLN
25.0000 mg | Freq: Once | INTRAMUSCULAR | Status: AC
  Administered 2017-12-11: 25 mg via INTRAMUSCULAR
  Filled 2017-12-11: qty 1

## 2017-12-11 NOTE — ED Notes (Signed)
Pt requesting something for nausea

## 2017-12-11 NOTE — Discharge Instructions (Signed)
Make sure you go to dialysis tomorrow.  If you develop worsening shortness of breath, vomiting, abdominal pain, or other new/concerning symptoms such as chest pain then return to the ER for evaluation.  Otherwise follow-up with your primary care doctor.
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
## 2017-12-12 DIAGNOSIS — E875 Hyperkalemia: Secondary | ICD-10-CM | POA: Diagnosis not present

## 2017-12-12 DIAGNOSIS — Z86711 Personal history of pulmonary embolism: Secondary | ICD-10-CM | POA: Diagnosis not present

## 2017-12-12 DIAGNOSIS — J811 Chronic pulmonary edema: Secondary | ICD-10-CM | POA: Diagnosis not present

## 2017-12-12 DIAGNOSIS — F4323 Adjustment disorder with mixed anxiety and depressed mood: Secondary | ICD-10-CM | POA: Diagnosis not present

## 2017-12-12 DIAGNOSIS — Z9114 Patient's other noncompliance with medication regimen: Secondary | ICD-10-CM | POA: Diagnosis not present

## 2017-12-12 DIAGNOSIS — I2510 Atherosclerotic heart disease of native coronary artery without angina pectoris: Secondary | ICD-10-CM | POA: Diagnosis present

## 2017-12-12 DIAGNOSIS — K219 Gastro-esophageal reflux disease without esophagitis: Secondary | ICD-10-CM | POA: Diagnosis present

## 2017-12-12 DIAGNOSIS — Z9115 Patient's noncompliance with renal dialysis: Secondary | ICD-10-CM | POA: Diagnosis not present

## 2017-12-12 DIAGNOSIS — K761 Chronic passive congestion of liver: Secondary | ICD-10-CM | POA: Diagnosis not present

## 2017-12-12 DIAGNOSIS — J90 Pleural effusion, not elsewhere classified: Secondary | ICD-10-CM | POA: Diagnosis not present

## 2017-12-12 DIAGNOSIS — I5042 Chronic combined systolic (congestive) and diastolic (congestive) heart failure: Secondary | ICD-10-CM | POA: Diagnosis not present

## 2017-12-12 DIAGNOSIS — Z79899 Other long term (current) drug therapy: Secondary | ICD-10-CM | POA: Diagnosis not present

## 2017-12-12 DIAGNOSIS — I313 Pericardial effusion (noninflammatory): Secondary | ICD-10-CM | POA: Diagnosis not present

## 2017-12-12 DIAGNOSIS — D631 Anemia in chronic kidney disease: Secondary | ICD-10-CM | POA: Diagnosis present

## 2017-12-12 DIAGNOSIS — G4737 Central sleep apnea in conditions classified elsewhere: Secondary | ICD-10-CM | POA: Diagnosis present

## 2017-12-12 DIAGNOSIS — N3289 Other specified disorders of bladder: Secondary | ICD-10-CM | POA: Diagnosis not present

## 2017-12-12 DIAGNOSIS — Z992 Dependence on renal dialysis: Secondary | ICD-10-CM | POA: Diagnosis not present

## 2017-12-12 DIAGNOSIS — K567 Ileus, unspecified: Secondary | ICD-10-CM | POA: Diagnosis not present

## 2017-12-12 DIAGNOSIS — G8929 Other chronic pain: Secondary | ICD-10-CM | POA: Diagnosis present

## 2017-12-12 DIAGNOSIS — D539 Nutritional anemia, unspecified: Secondary | ICD-10-CM | POA: Diagnosis present

## 2017-12-12 DIAGNOSIS — J918 Pleural effusion in other conditions classified elsewhere: Secondary | ICD-10-CM | POA: Diagnosis present

## 2017-12-12 DIAGNOSIS — R0602 Shortness of breath: Secondary | ICD-10-CM | POA: Diagnosis not present

## 2017-12-12 DIAGNOSIS — I132 Hypertensive heart and chronic kidney disease with heart failure and with stage 5 chronic kidney disease, or end stage renal disease: Secondary | ICD-10-CM | POA: Diagnosis present

## 2017-12-12 DIAGNOSIS — Z86718 Personal history of other venous thrombosis and embolism: Secondary | ICD-10-CM | POA: Diagnosis not present

## 2017-12-12 DIAGNOSIS — R748 Abnormal levels of other serum enzymes: Secondary | ICD-10-CM | POA: Diagnosis not present

## 2017-12-12 DIAGNOSIS — Z888 Allergy status to other drugs, medicaments and biological substances status: Secondary | ICD-10-CM | POA: Diagnosis not present

## 2017-12-12 DIAGNOSIS — N2889 Other specified disorders of kidney and ureter: Secondary | ICD-10-CM | POA: Diagnosis not present

## 2017-12-12 DIAGNOSIS — R942 Abnormal results of pulmonary function studies: Secondary | ICD-10-CM | POA: Diagnosis not present

## 2017-12-12 DIAGNOSIS — K5730 Diverticulosis of large intestine without perforation or abscess without bleeding: Secondary | ICD-10-CM | POA: Diagnosis not present

## 2017-12-12 DIAGNOSIS — N186 End stage renal disease: Secondary | ICD-10-CM | POA: Diagnosis not present

## 2017-12-12 DIAGNOSIS — G4731 Primary central sleep apnea: Secondary | ICD-10-CM | POA: Diagnosis not present

## 2017-12-12 DIAGNOSIS — I160 Hypertensive urgency: Secondary | ICD-10-CM | POA: Diagnosis not present

## 2017-12-12 DIAGNOSIS — Z940 Kidney transplant status: Secondary | ICD-10-CM | POA: Diagnosis not present

## 2017-12-12 DIAGNOSIS — Z7901 Long term (current) use of anticoagulants: Secondary | ICD-10-CM | POA: Diagnosis not present

## 2017-12-12 DIAGNOSIS — Z765 Malingerer [conscious simulation]: Secondary | ICD-10-CM | POA: Diagnosis not present

## 2017-12-15 DIAGNOSIS — N186 End stage renal disease: Secondary | ICD-10-CM | POA: Diagnosis not present

## 2017-12-15 DIAGNOSIS — D509 Iron deficiency anemia, unspecified: Secondary | ICD-10-CM | POA: Diagnosis not present

## 2017-12-15 DIAGNOSIS — D631 Anemia in chronic kidney disease: Secondary | ICD-10-CM | POA: Diagnosis not present

## 2017-12-15 DIAGNOSIS — N2581 Secondary hyperparathyroidism of renal origin: Secondary | ICD-10-CM | POA: Diagnosis not present

## 2017-12-15 DIAGNOSIS — Z992 Dependence on renal dialysis: Secondary | ICD-10-CM | POA: Diagnosis not present

## 2017-12-15 DIAGNOSIS — B9562 Methicillin resistant Staphylococcus aureus infection as the cause of diseases classified elsewhere: Secondary | ICD-10-CM | POA: Diagnosis not present

## 2017-12-17 DIAGNOSIS — D509 Iron deficiency anemia, unspecified: Secondary | ICD-10-CM | POA: Diagnosis not present

## 2017-12-17 DIAGNOSIS — N186 End stage renal disease: Secondary | ICD-10-CM | POA: Diagnosis not present

## 2017-12-17 DIAGNOSIS — B9562 Methicillin resistant Staphylococcus aureus infection as the cause of diseases classified elsewhere: Secondary | ICD-10-CM | POA: Diagnosis not present

## 2017-12-17 DIAGNOSIS — Z992 Dependence on renal dialysis: Secondary | ICD-10-CM | POA: Diagnosis not present

## 2017-12-17 DIAGNOSIS — N2581 Secondary hyperparathyroidism of renal origin: Secondary | ICD-10-CM | POA: Diagnosis not present

## 2017-12-17 DIAGNOSIS — D631 Anemia in chronic kidney disease: Secondary | ICD-10-CM | POA: Diagnosis not present

## 2017-12-19 DIAGNOSIS — Z992 Dependence on renal dialysis: Secondary | ICD-10-CM | POA: Diagnosis not present

## 2017-12-19 DIAGNOSIS — N2581 Secondary hyperparathyroidism of renal origin: Secondary | ICD-10-CM | POA: Diagnosis not present

## 2017-12-19 DIAGNOSIS — N186 End stage renal disease: Secondary | ICD-10-CM | POA: Diagnosis not present

## 2017-12-19 DIAGNOSIS — D631 Anemia in chronic kidney disease: Secondary | ICD-10-CM | POA: Diagnosis not present

## 2017-12-19 DIAGNOSIS — B9562 Methicillin resistant Staphylococcus aureus infection as the cause of diseases classified elsewhere: Secondary | ICD-10-CM | POA: Diagnosis not present

## 2017-12-19 DIAGNOSIS — D509 Iron deficiency anemia, unspecified: Secondary | ICD-10-CM | POA: Diagnosis not present

## 2017-12-20 DIAGNOSIS — T8610 Unspecified complication of kidney transplant: Secondary | ICD-10-CM | POA: Diagnosis not present

## 2017-12-20 DIAGNOSIS — Z992 Dependence on renal dialysis: Secondary | ICD-10-CM | POA: Diagnosis not present

## 2017-12-20 DIAGNOSIS — N186 End stage renal disease: Secondary | ICD-10-CM | POA: Diagnosis not present

## 2017-12-21 ENCOUNTER — Encounter (HOSPITAL_COMMUNITY): Payer: Self-pay | Admitting: Emergency Medicine

## 2017-12-21 ENCOUNTER — Emergency Department (HOSPITAL_COMMUNITY)
Admission: EM | Admit: 2017-12-21 | Discharge: 2017-12-22 | Disposition: A | Discharge status: Home / Self Care | Payer: Medicare Other | Attending: Emergency Medicine | Admitting: Emergency Medicine

## 2017-12-21 DIAGNOSIS — R609 Edema, unspecified: Secondary | ICD-10-CM | POA: Insufficient documentation

## 2017-12-21 DIAGNOSIS — I132 Hypertensive heart and chronic kidney disease with heart failure and with stage 5 chronic kidney disease, or end stage renal disease: Secondary | ICD-10-CM | POA: Insufficient documentation

## 2017-12-21 DIAGNOSIS — R1013 Epigastric pain: Secondary | ICD-10-CM | POA: Diagnosis not present

## 2017-12-21 DIAGNOSIS — E119 Type 2 diabetes mellitus without complications: Secondary | ICD-10-CM | POA: Insufficient documentation

## 2017-12-21 DIAGNOSIS — N186 End stage renal disease: Secondary | ICD-10-CM

## 2017-12-21 DIAGNOSIS — Z87891 Personal history of nicotine dependence: Secondary | ICD-10-CM | POA: Insufficient documentation

## 2017-12-21 DIAGNOSIS — I5042 Chronic combined systolic (congestive) and diastolic (congestive) heart failure: Secondary | ICD-10-CM | POA: Insufficient documentation

## 2017-12-21 DIAGNOSIS — Z79899 Other long term (current) drug therapy: Secondary | ICD-10-CM | POA: Diagnosis not present

## 2017-12-21 DIAGNOSIS — R110 Nausea: Secondary | ICD-10-CM | POA: Diagnosis not present

## 2017-12-21 DIAGNOSIS — I120 Hypertensive chronic kidney disease with stage 5 chronic kidney disease or end stage renal disease: Secondary | ICD-10-CM | POA: Diagnosis not present

## 2017-12-21 DIAGNOSIS — R109 Unspecified abdominal pain: Secondary | ICD-10-CM | POA: Diagnosis present

## 2017-12-21 DIAGNOSIS — R600 Localized edema: Secondary | ICD-10-CM

## 2017-12-21 LAB — COMPREHENSIVE METABOLIC PANEL
ALT: 12 U/L — AB (ref 17–63)
ANION GAP: 16 — AB (ref 5–15)
AST: 28 U/L (ref 15–41)
Albumin: 2.9 g/dL — ABNORMAL LOW (ref 3.5–5.0)
Alkaline Phosphatase: 94 U/L (ref 38–126)
BILIRUBIN TOTAL: 1.4 mg/dL — AB (ref 0.3–1.2)
BUN: 39 mg/dL — ABNORMAL HIGH (ref 6–20)
CALCIUM: 9.6 mg/dL (ref 8.9–10.3)
CO2: 22 mmol/L (ref 22–32)
CREATININE: 10.18 mg/dL — AB (ref 0.61–1.24)
Chloride: 96 mmol/L — ABNORMAL LOW (ref 101–111)
GFR, EST AFRICAN AMERICAN: 6 mL/min — AB (ref >60)
GFR, EST NON AFRICAN AMERICAN: 5 mL/min — AB (ref >60)
Glucose, Bld: 93 mg/dL (ref 65–99)
Potassium: 4.6 mmol/L (ref 3.5–5.1)
SODIUM: 134 mmol/L — AB (ref 135–145)
TOTAL PROTEIN: 8 g/dL (ref 6.5–8.1)

## 2017-12-21 LAB — CBC
HCT: 30.9 % — ABNORMAL LOW (ref 39.0–52.0)
HEMOGLOBIN: 9.7 g/dL — AB (ref 13.0–17.0)
MCH: 27 pg (ref 26.0–34.0)
MCHC: 31.4 g/dL (ref 30.0–36.0)
MCV: 86.1 fL (ref 78.0–100.0)
PLATELETS: 160 10*3/uL (ref 150–400)
RBC: 3.59 MIL/uL — AB (ref 4.22–5.81)
RDW: 19.9 % — ABNORMAL HIGH (ref 11.5–15.5)
WBC: 4.7 10*3/uL (ref 4.0–10.5)

## 2017-12-21 LAB — LIPASE, BLOOD: Lipase: 37 U/L (ref 11–51)

## 2017-12-21 LAB — CBG MONITORING, ED: GLUCOSE-CAPILLARY: 84 mg/dL (ref 65–99)

## 2017-12-21 MED ORDER — SODIUM CHLORIDE 0.9 % IV SOLN
INTRAVENOUS | Status: AC
  Administered 2017-12-21: 23:00:00 via INTRAVENOUS

## 2017-12-21 MED ORDER — MORPHINE SULFATE (PF) 4 MG/ML IV SOLN
4.0000 mg | INTRAVENOUS | Status: DC | PRN
  Administered 2017-12-21 – 2017-12-22 (×2): 4 mg via INTRAVENOUS
  Filled 2017-12-21 (×2): qty 1

## 2017-12-21 MED ORDER — ONDANSETRON HCL 4 MG/2ML IJ SOLN
4.0000 mg | Freq: Once | INTRAMUSCULAR | Status: AC
  Administered 2017-12-21: 4 mg via INTRAVENOUS
  Filled 2017-12-21: qty 2

## 2017-12-21 NOTE — ED Triage Notes (Signed)
Pt c/o LLQ abd pain x 3 days, emesis x 4 last 24 hours. Diarrhea x 3-4 in last 24 hours. +shob.  HD M/W/F, pt states he was not able to finish treatment Friday d/t abd pain/n/v.
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
## 2017-12-21 NOTE — ED Notes (Signed)
Pt refuses to put on gown.

## 2017-12-21 NOTE — ED Notes (Signed)
ED Provider at bedside. 

## 2017-12-21 NOTE — ED Notes (Signed)
Pt sitting on side of bed leaning on the bedside table. I informed pt to not lean on the bedside table because it has wheels and I don't want him to lean on it and fall. Pt understood but continues to lean on bedside table. I put the back of his stretcher up so he can lay back but still sit up on stretcher.

## 2017-12-22 ENCOUNTER — Ambulatory Visit (HOSPITAL_BASED_OUTPATIENT_CLINIC_OR_DEPARTMENT_OTHER)
Admission: RE | Admit: 2017-12-22 | Discharge: 2017-12-22 | Disposition: A | Discharge status: Home / Self Care | Payer: Medicare Other | Source: Ambulatory Visit | Attending: Emergency Medicine | Admitting: Emergency Medicine

## 2017-12-22 DIAGNOSIS — N2889 Other specified disorders of kidney and ureter: Secondary | ICD-10-CM | POA: Diagnosis not present

## 2017-12-22 DIAGNOSIS — R1032 Left lower quadrant pain: Secondary | ICD-10-CM | POA: Diagnosis not present

## 2017-12-22 DIAGNOSIS — I132 Hypertensive heart and chronic kidney disease with heart failure and with stage 5 chronic kidney disease, or end stage renal disease: Secondary | ICD-10-CM | POA: Diagnosis not present

## 2017-12-22 DIAGNOSIS — R1013 Epigastric pain: Secondary | ICD-10-CM | POA: Diagnosis not present

## 2017-12-22 DIAGNOSIS — Z888 Allergy status to other drugs, medicaments and biological substances status: Secondary | ICD-10-CM | POA: Diagnosis not present

## 2017-12-22 DIAGNOSIS — Z9989 Dependence on other enabling machines and devices: Secondary | ICD-10-CM | POA: Diagnosis not present

## 2017-12-22 DIAGNOSIS — I509 Heart failure, unspecified: Secondary | ICD-10-CM | POA: Diagnosis not present

## 2017-12-22 DIAGNOSIS — T8612 Kidney transplant failure: Secondary | ICD-10-CM | POA: Diagnosis not present

## 2017-12-22 DIAGNOSIS — G4733 Obstructive sleep apnea (adult) (pediatric): Secondary | ICD-10-CM | POA: Diagnosis not present

## 2017-12-22 DIAGNOSIS — Z8719 Personal history of other diseases of the digestive system: Secondary | ICD-10-CM | POA: Diagnosis not present

## 2017-12-22 DIAGNOSIS — Z992 Dependence on renal dialysis: Secondary | ICD-10-CM | POA: Diagnosis not present

## 2017-12-22 DIAGNOSIS — M7989 Other specified soft tissue disorders: Secondary | ICD-10-CM | POA: Diagnosis not present

## 2017-12-22 DIAGNOSIS — R112 Nausea with vomiting, unspecified: Secondary | ICD-10-CM | POA: Diagnosis not present

## 2017-12-22 DIAGNOSIS — Z886 Allergy status to analgesic agent status: Secondary | ICD-10-CM | POA: Diagnosis not present

## 2017-12-22 DIAGNOSIS — I82721 Chronic embolism and thrombosis of deep veins of right upper extremity: Secondary | ICD-10-CM

## 2017-12-22 DIAGNOSIS — R109 Unspecified abdominal pain: Secondary | ICD-10-CM | POA: Diagnosis not present

## 2017-12-22 DIAGNOSIS — N186 End stage renal disease: Secondary | ICD-10-CM | POA: Diagnosis not present

## 2017-12-22 DIAGNOSIS — Z79899 Other long term (current) drug therapy: Secondary | ICD-10-CM | POA: Diagnosis not present

## 2017-12-22 DIAGNOSIS — J90 Pleural effusion, not elsewhere classified: Secondary | ICD-10-CM | POA: Diagnosis not present

## 2017-12-22 DIAGNOSIS — Z7901 Long term (current) use of anticoagulants: Secondary | ICD-10-CM | POA: Diagnosis not present

## 2017-12-22 DIAGNOSIS — J9811 Atelectasis: Secondary | ICD-10-CM | POA: Diagnosis not present

## 2017-12-22 DIAGNOSIS — I517 Cardiomegaly: Secondary | ICD-10-CM | POA: Diagnosis not present

## 2017-12-22 DIAGNOSIS — Z86718 Personal history of other venous thrombosis and embolism: Secondary | ICD-10-CM | POA: Diagnosis not present

## 2017-12-22 DIAGNOSIS — R1010 Upper abdominal pain, unspecified: Secondary | ICD-10-CM | POA: Diagnosis not present

## 2017-12-22 LAB — I-STAT CG4 LACTIC ACID, ED: Lactic Acid, Venous: 1.6 mmol/L (ref 0.5–1.9)

## 2017-12-22 MED ORDER — OXYCODONE HCL 5 MG PO TABS
5.0000 mg | ORAL_TABLET | Freq: Once | ORAL | Status: AC
  Administered 2017-12-22: 5 mg via ORAL
  Filled 2017-12-22: qty 1

## 2017-12-22 MED ORDER — ENOXAPARIN SODIUM 80 MG/0.8ML ~~LOC~~ SOLN
1.0000 mg/kg | Freq: Once | SUBCUTANEOUS | Status: AC
  Administered 2017-12-22: 75 mg via SUBCUTANEOUS
  Filled 2017-12-22: qty 0.75

## 2017-12-22 MED ORDER — ONDANSETRON HCL 4 MG/2ML IJ SOLN
4.0000 mg | Freq: Once | INTRAMUSCULAR | Status: AC
  Administered 2017-12-22: 4 mg via INTRAVENOUS
  Filled 2017-12-22: qty 2

## 2017-12-22 NOTE — ED Notes (Signed)
Pt refused discharge vitals 

## 2017-12-22 NOTE — Progress Notes (Signed)
VASCULAR LAB PRELIMINARY  PRELIMINARY  PRELIMINARY  PRELIMINARY  Right upper extremity venous duplex completed.    Preliminary report:  There is no acute DVT.  There is chronic DVT noted in the brachial vein in the proximal arm.  There is chronic SVT noted in the cephalic and basilic veins.   Leah Skora, RVT 12/22/2017, 9:02 AM

## 2017-12-22 NOTE — Discharge Instructions (Signed)
1. Medications: usual home medications 2. Treatment: rest, drink plenty of fluids,  3. Follow Up: Please followup with your primary doctor in 2 days for discussion of your diagnoses and further evaluation after today's visit; if you do not have a primary care doctor use the resource guide provided to find one; Please return to the ER for persistent vomiting, fevers or other concerns.  IMPORTANT PATIENT INSTRUCTIONS:  You have been scheduled for an Outpatient Vascular Study at [facility].    If tomorrow is a Saturday, Sunday or holiday, please go to the [facility] Emergency Department Registration Desk at 8 am tomorrow morning and tell them you are there for a vascular study.  If tomorrow is a weekday (Monday-Friday), please go to [facility] Admitting Department at 8 am and tell them  you are there for a vascular study.

## 2017-12-22 NOTE — ED Notes (Signed)
Pt tolerated apple juice and crackers. Without emesis.

## 2017-12-22 NOTE — ED Notes (Signed)
IV team at bedside 

## 2017-12-23 DIAGNOSIS — J9811 Atelectasis: Secondary | ICD-10-CM | POA: Diagnosis not present

## 2017-12-23 DIAGNOSIS — R1013 Epigastric pain: Secondary | ICD-10-CM | POA: Diagnosis not present

## 2017-12-23 DIAGNOSIS — I517 Cardiomegaly: Secondary | ICD-10-CM | POA: Diagnosis not present

## 2017-12-23 DIAGNOSIS — R109 Unspecified abdominal pain: Secondary | ICD-10-CM | POA: Diagnosis not present

## 2017-12-23 DIAGNOSIS — T8612 Kidney transplant failure: Secondary | ICD-10-CM | POA: Diagnosis not present

## 2017-12-23 DIAGNOSIS — N2889 Other specified disorders of kidney and ureter: Secondary | ICD-10-CM | POA: Diagnosis not present

## 2017-12-23 DIAGNOSIS — J90 Pleural effusion, not elsewhere classified: Secondary | ICD-10-CM | POA: Diagnosis not present

## 2017-12-24 ENCOUNTER — Encounter (HOSPITAL_COMMUNITY): Payer: Self-pay | Admitting: Emergency Medicine

## 2017-12-24 ENCOUNTER — Emergency Department (HOSPITAL_COMMUNITY)
Admission: EM | Admit: 2017-12-24 | Discharge: 2017-12-24 | Disposition: A | Discharge status: Home / Self Care | Payer: Medicare Other | Attending: Emergency Medicine | Admitting: Emergency Medicine

## 2017-12-24 DIAGNOSIS — Z87891 Personal history of nicotine dependence: Secondary | ICD-10-CM | POA: Insufficient documentation

## 2017-12-24 DIAGNOSIS — R109 Unspecified abdominal pain: Secondary | ICD-10-CM

## 2017-12-24 DIAGNOSIS — E119 Type 2 diabetes mellitus without complications: Secondary | ICD-10-CM | POA: Diagnosis not present

## 2017-12-24 DIAGNOSIS — R1111 Vomiting without nausea: Secondary | ICD-10-CM | POA: Diagnosis not present

## 2017-12-24 DIAGNOSIS — I110 Hypertensive heart disease with heart failure: Secondary | ICD-10-CM | POA: Insufficient documentation

## 2017-12-24 DIAGNOSIS — G8929 Other chronic pain: Secondary | ICD-10-CM | POA: Diagnosis not present

## 2017-12-24 DIAGNOSIS — I10 Essential (primary) hypertension: Secondary | ICD-10-CM | POA: Diagnosis not present

## 2017-12-24 DIAGNOSIS — R1032 Left lower quadrant pain: Secondary | ICD-10-CM | POA: Insufficient documentation

## 2017-12-24 DIAGNOSIS — R0902 Hypoxemia: Secondary | ICD-10-CM | POA: Diagnosis not present

## 2017-12-24 DIAGNOSIS — I5022 Chronic systolic (congestive) heart failure: Secondary | ICD-10-CM | POA: Insufficient documentation

## 2017-12-24 DIAGNOSIS — Z79899 Other long term (current) drug therapy: Secondary | ICD-10-CM | POA: Diagnosis not present

## 2017-12-24 DIAGNOSIS — R9431 Abnormal electrocardiogram [ECG] [EKG]: Secondary | ICD-10-CM | POA: Diagnosis not present

## 2017-12-24 DIAGNOSIS — R0602 Shortness of breath: Secondary | ICD-10-CM | POA: Diagnosis not present

## 2017-12-24 DIAGNOSIS — R110 Nausea: Secondary | ICD-10-CM | POA: Diagnosis not present

## 2017-12-24 LAB — CBC WITH DIFFERENTIAL/PLATELET
Abs Immature Granulocytes: 0 10*3/uL (ref 0.0–0.1)
BASOS ABS: 0 10*3/uL (ref 0.0–0.1)
BASOS PCT: 1 %
EOS ABS: 0.2 10*3/uL (ref 0.0–0.7)
EOS PCT: 5 %
HEMATOCRIT: 31.4 % — AB (ref 39.0–52.0)
Hemoglobin: 9.7 g/dL — ABNORMAL LOW (ref 13.0–17.0)
Immature Granulocytes: 0 %
Lymphocytes Relative: 12 %
Lymphs Abs: 0.5 10*3/uL — ABNORMAL LOW (ref 0.7–4.0)
MCH: 26.9 pg (ref 26.0–34.0)
MCHC: 30.9 g/dL (ref 30.0–36.0)
MCV: 87.2 fL (ref 78.0–100.0)
Monocytes Absolute: 0.7 10*3/uL (ref 0.1–1.0)
Monocytes Relative: 17 %
Neutro Abs: 2.6 10*3/uL (ref 1.7–7.7)
Neutrophils Relative %: 65 %
Platelets: 161 10*3/uL (ref 150–400)
RBC: 3.6 MIL/uL — ABNORMAL LOW (ref 4.22–5.81)
RDW: 20 % — AB (ref 11.5–15.5)
WBC: 4 10*3/uL (ref 4.0–10.5)

## 2017-12-24 LAB — COMPREHENSIVE METABOLIC PANEL
ALK PHOS: 99 U/L (ref 38–126)
ALT: 14 U/L — AB (ref 17–63)
AST: 36 U/L (ref 15–41)
Albumin: 3 g/dL — ABNORMAL LOW (ref 3.5–5.0)
Anion gap: 16 — ABNORMAL HIGH (ref 5–15)
BUN: 43 mg/dL — ABNORMAL HIGH (ref 6–20)
CALCIUM: 9.4 mg/dL (ref 8.9–10.3)
CO2: 21 mmol/L — ABNORMAL LOW (ref 22–32)
CREATININE: 11.05 mg/dL — AB (ref 0.61–1.24)
Chloride: 99 mmol/L — ABNORMAL LOW (ref 101–111)
GFR, EST AFRICAN AMERICAN: 5 mL/min — AB (ref >60)
GFR, EST NON AFRICAN AMERICAN: 5 mL/min — AB (ref >60)
Glucose, Bld: 103 mg/dL — ABNORMAL HIGH (ref 65–99)
Potassium: 4.7 mmol/L (ref 3.5–5.1)
Sodium: 136 mmol/L (ref 135–145)
TOTAL PROTEIN: 8.1 g/dL (ref 6.5–8.1)
Total Bilirubin: 1.4 mg/dL — ABNORMAL HIGH (ref 0.3–1.2)

## 2017-12-24 LAB — LIPASE, BLOOD: LIPASE: 28 U/L (ref 11–51)

## 2017-12-24 MED ORDER — PROMETHAZINE HCL 25 MG/ML IJ SOLN
25.0000 mg | Freq: Once | INTRAMUSCULAR | Status: DC
  Filled 2017-12-24: qty 1

## 2017-12-24 MED ORDER — FAMOTIDINE 20 MG PO TABS
20.0000 mg | ORAL_TABLET | Freq: Once | ORAL | Status: DC
  Filled 2017-12-24: qty 1

## 2017-12-24 MED ORDER — OXYCODONE HCL 5 MG PO TABS
10.0000 mg | ORAL_TABLET | Freq: Once | ORAL | Status: DC
  Filled 2017-12-24: qty 2

## 2017-12-24 MED ORDER — GI COCKTAIL ~~LOC~~
30.0000 mL | Freq: Once | ORAL | Status: DC
  Filled 2017-12-24: qty 30

## 2017-12-24 MED ORDER — KETOROLAC TROMETHAMINE 15 MG/ML IJ SOLN
15.0000 mg | Freq: Once | INTRAMUSCULAR | Status: DC
  Filled 2017-12-24: qty 1

## 2017-12-24 NOTE — ED Notes (Addendum)
Pt refused to have his vital signs re-checked despite the importance of them explained to him for a second time.

## 2017-12-24 NOTE — ED Notes (Signed)
Patient refused all medications; patient stated that he will not sign discharge paperwork and told SN to "throw the paperwork in the trash." Pt discharged from ER without signature. Freight forwarder Nurse notified

## 2017-12-24 NOTE — ED Provider Notes (Signed)
Patient placed in Quick Look pathway, seen and evaluated   Chief Complaint: abdominal pain   HPI:   52 year old male w/ a h/o of ESRD on HD (M/W/F), DM Type II, HTN, CHF, pancreatitis presenting by EMS from dialysis presenting with abdominal pain, nausea, and vomiting for 3 days. He has had 4-5 episodes of NBNB emesis today. Did not start dialysis today. Last full dialysis session was 2 days ago.   History includes>  Surgeries and Procedures Performed:   3/15 EGD/EUS with cystgastrostomy using Axios stent  3/27 EGD - Normal esophagus. Pre-existing gastric stents. There is an AXIOS stent with a double plastic double pigtail stent. The pigtail stent was easily removed. Unable to enter the cyst cavity through the AXIOS stent with the endoscope. Suspect stent malposition.  3/29 CT guided percutaneous cyst drain placed   3/29 IR central line placement in right EJ  4/5 US guided thoracentesis   ROS: Abdominal pain, N/V  Physical Exam:   Gen: No distress  Neuro: Awake and Alert  Skin: Warm    Focused Exam: Abdomen is distended. Diffuse abdominal pain. No rebound or guarding. No focal tenderness.    Initiation of care has begun. The patient has been counseled on the process, plan, and necessity for staying for the completion/evaluation, and the remainder of the medical screening examination    Joanne Gavel, PA-C 12/24/17 1426    Elnora Morrison, MD 12/24/17 1627

## 2017-12-24 NOTE — ED Triage Notes (Signed)
Pt states generalized upper abdominal pain radiating into the left lower abdomen for severla days. Pt was seen here for same 6/2. Seen at [facility] yesterday. Emesis X 4-5. Last completed dialysis was Monday.
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
## 2017-12-24 NOTE — Discharge Instructions (Signed)
Please follow up with your GI doctor.

## 2017-12-24 NOTE — ED Notes (Signed)
No reply x3 at 14:50.

## 2017-12-24 NOTE — ED Notes (Signed)
This EMT was requested to speak with pt. Pt has not been responding to vitals call. Pt stated "vitals are bullshit" and "don't call me for no vitals". Pt did not respond to 3 vitals calls in lobby and was slated to be removed. Pt upset by this. This EMT repeatedly attempted to explain current state, and importance of vitals. Pt refusing vitals. Pt "only wants to hear that I am being roomed". "If I am not roomed by the time my ride arrives, I'm leaving". This EMT attempted to have pt stay, informing him of current wait time. Pt dismissed this EMT.

## 2017-12-24 NOTE — ED Notes (Signed)
Pt discharged from ED; instructions provided; Pt encouraged to return to ED if symptoms worsen and to f/u with PCP; Pt verbalized understanding of all instructions; pt refused all meds and to sign dc ppwk

## 2017-12-24 NOTE — ED Notes (Signed)
No reply x2 at 14:26.

## 2017-12-25 DIAGNOSIS — Z7901 Long term (current) use of anticoagulants: Secondary | ICD-10-CM | POA: Diagnosis not present

## 2017-12-25 DIAGNOSIS — K861 Other chronic pancreatitis: Secondary | ICD-10-CM | POA: Diagnosis not present

## 2017-12-25 DIAGNOSIS — R0602 Shortness of breath: Secondary | ICD-10-CM | POA: Diagnosis not present

## 2017-12-25 DIAGNOSIS — N186 End stage renal disease: Secondary | ICD-10-CM | POA: Diagnosis not present

## 2017-12-25 DIAGNOSIS — I120 Hypertensive chronic kidney disease with stage 5 chronic kidney disease or end stage renal disease: Secondary | ICD-10-CM | POA: Diagnosis not present

## 2017-12-25 DIAGNOSIS — I509 Heart failure, unspecified: Secondary | ICD-10-CM | POA: Diagnosis not present

## 2017-12-25 DIAGNOSIS — N2889 Other specified disorders of kidney and ureter: Secondary | ICD-10-CM | POA: Diagnosis not present

## 2017-12-25 DIAGNOSIS — Z9989 Dependence on other enabling machines and devices: Secondary | ICD-10-CM | POA: Diagnosis not present

## 2017-12-25 DIAGNOSIS — Z888 Allergy status to other drugs, medicaments and biological substances status: Secondary | ICD-10-CM | POA: Diagnosis not present

## 2017-12-25 DIAGNOSIS — R1084 Generalized abdominal pain: Secondary | ICD-10-CM | POA: Diagnosis not present

## 2017-12-25 DIAGNOSIS — G4733 Obstructive sleep apnea (adult) (pediatric): Secondary | ICD-10-CM | POA: Diagnosis not present

## 2017-12-25 DIAGNOSIS — I132 Hypertensive heart and chronic kidney disease with heart failure and with stage 5 chronic kidney disease, or end stage renal disease: Secondary | ICD-10-CM | POA: Diagnosis not present

## 2017-12-25 DIAGNOSIS — Z886 Allergy status to analgesic agent status: Secondary | ICD-10-CM | POA: Diagnosis not present

## 2017-12-25 DIAGNOSIS — Z8719 Personal history of other diseases of the digestive system: Secondary | ICD-10-CM | POA: Diagnosis not present

## 2017-12-25 DIAGNOSIS — Z79899 Other long term (current) drug therapy: Secondary | ICD-10-CM | POA: Diagnosis not present

## 2017-12-25 DIAGNOSIS — Z992 Dependence on renal dialysis: Secondary | ICD-10-CM | POA: Diagnosis not present

## 2017-12-26 DIAGNOSIS — D509 Iron deficiency anemia, unspecified: Secondary | ICD-10-CM | POA: Diagnosis not present

## 2017-12-26 DIAGNOSIS — N186 End stage renal disease: Secondary | ICD-10-CM | POA: Diagnosis not present

## 2017-12-26 DIAGNOSIS — N2581 Secondary hyperparathyroidism of renal origin: Secondary | ICD-10-CM | POA: Diagnosis not present

## 2017-12-26 DIAGNOSIS — Z992 Dependence on renal dialysis: Secondary | ICD-10-CM | POA: Diagnosis not present

## 2017-12-26 DIAGNOSIS — D631 Anemia in chronic kidney disease: Secondary | ICD-10-CM | POA: Diagnosis not present

## 2017-12-29 DIAGNOSIS — N186 End stage renal disease: Secondary | ICD-10-CM | POA: Diagnosis not present

## 2017-12-29 DIAGNOSIS — D631 Anemia in chronic kidney disease: Secondary | ICD-10-CM | POA: Diagnosis not present

## 2017-12-29 DIAGNOSIS — D509 Iron deficiency anemia, unspecified: Secondary | ICD-10-CM | POA: Diagnosis not present

## 2017-12-29 DIAGNOSIS — Z992 Dependence on renal dialysis: Secondary | ICD-10-CM | POA: Diagnosis not present

## 2017-12-29 DIAGNOSIS — N2581 Secondary hyperparathyroidism of renal origin: Secondary | ICD-10-CM | POA: Diagnosis not present

## 2017-12-29 IMAGING — CR DG CHEST 2V
2 series · 2 of 2 positions shown · non-contrast
Comparison: July 17, 2015

CLINICAL DATA: Shortness of breath.  Chronic renal failure

EXAM:
CHEST  2 VIEW

[chest pa]
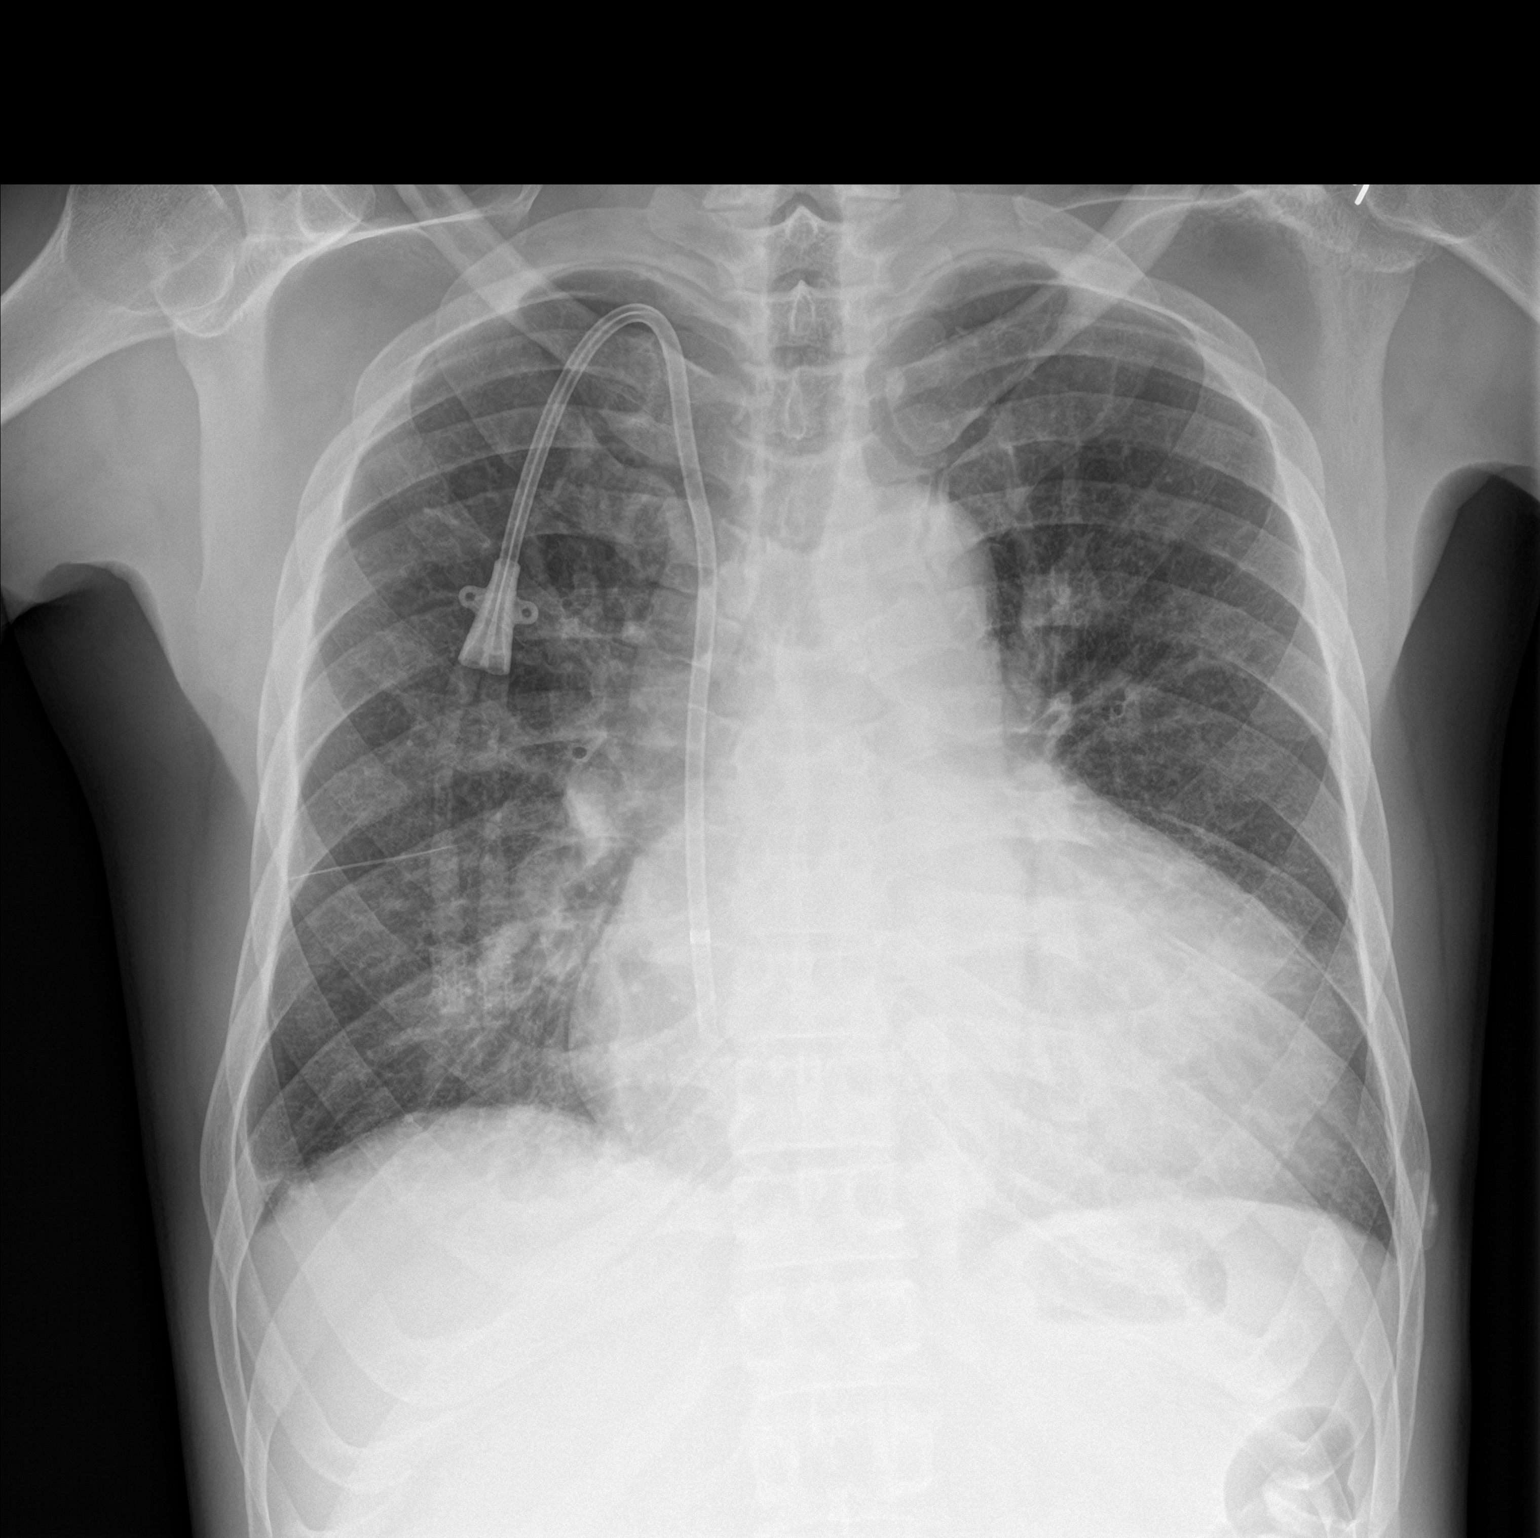

[chest lat]
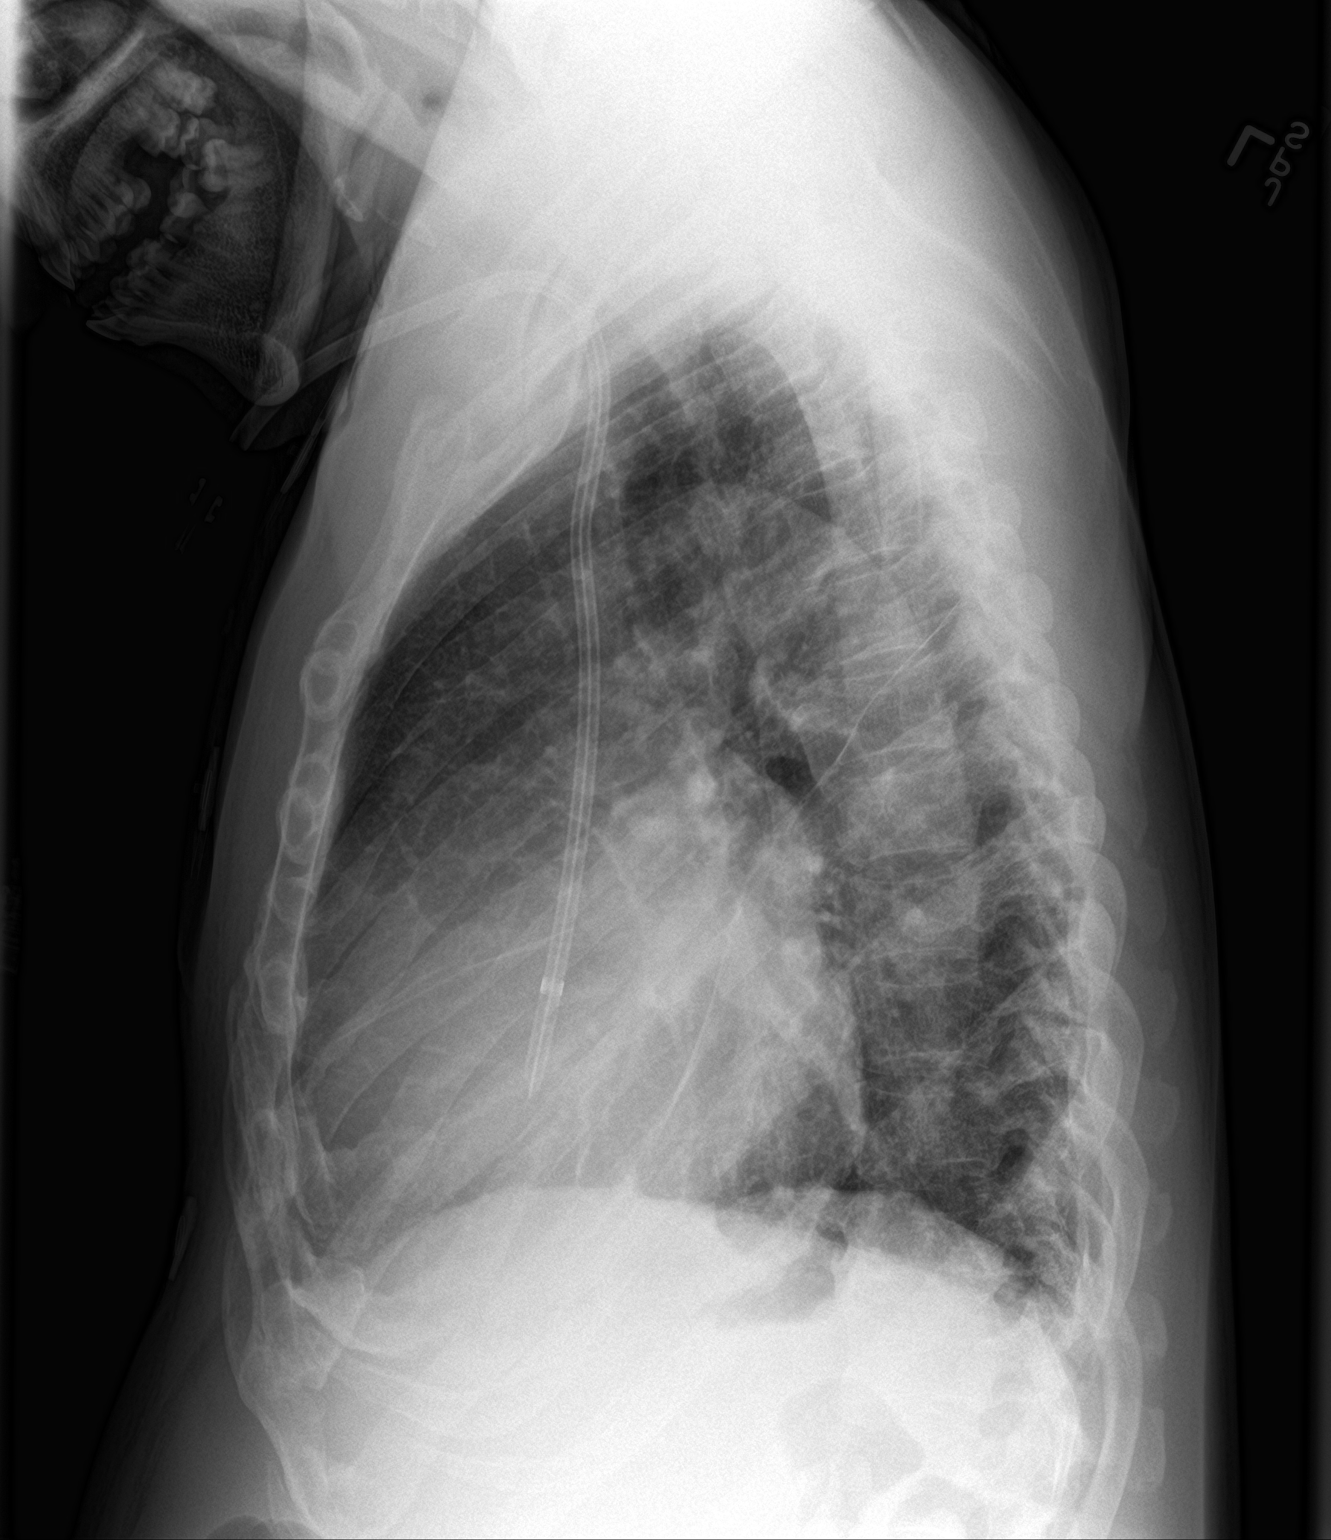

[2 of 2 positions shown; findings below may reference images not displayed]

FINDINGS: There is cardiomegaly with mild pulmonary venous hypertension. There
is a minimal right effusion. No edema or consolidation. Central
catheter tip is in the right atrium slightly beyond the cavoatrial
junction. No pneumothorax. No adenopathy.
IMPRESSION: Persistent pulmonary vascular congestion. No frank edema or
consolidation. Minimal right effusion. No change and central
catheter position. No pneumothorax.

## 2017-12-30 DIAGNOSIS — R590 Localized enlarged lymph nodes: Secondary | ICD-10-CM | POA: Diagnosis not present

## 2017-12-30 DIAGNOSIS — R1084 Generalized abdominal pain: Secondary | ICD-10-CM | POA: Diagnosis not present

## 2017-12-30 DIAGNOSIS — R918 Other nonspecific abnormal finding of lung field: Secondary | ICD-10-CM | POA: Diagnosis not present

## 2017-12-30 DIAGNOSIS — R079 Chest pain, unspecified: Secondary | ICD-10-CM | POA: Diagnosis not present

## 2017-12-30 DIAGNOSIS — K859 Acute pancreatitis without necrosis or infection, unspecified: Secondary | ICD-10-CM | POA: Diagnosis not present

## 2017-12-30 DIAGNOSIS — Z48813 Encounter for surgical aftercare following surgery on the respiratory system: Secondary | ICD-10-CM | POA: Diagnosis not present

## 2017-12-30 DIAGNOSIS — N261 Atrophy of kidney (terminal): Secondary | ICD-10-CM | POA: Diagnosis not present

## 2017-12-30 DIAGNOSIS — K861 Other chronic pancreatitis: Secondary | ICD-10-CM | POA: Diagnosis not present

## 2017-12-30 DIAGNOSIS — I5022 Chronic systolic (congestive) heart failure: Secondary | ICD-10-CM | POA: Diagnosis not present

## 2017-12-30 DIAGNOSIS — I132 Hypertensive heart and chronic kidney disease with heart failure and with stage 5 chronic kidney disease, or end stage renal disease: Secondary | ICD-10-CM | POA: Diagnosis not present

## 2017-12-30 DIAGNOSIS — I313 Pericardial effusion (noninflammatory): Secondary | ICD-10-CM | POA: Diagnosis not present

## 2017-12-30 DIAGNOSIS — Z992 Dependence on renal dialysis: Secondary | ICD-10-CM | POA: Diagnosis not present

## 2017-12-30 DIAGNOSIS — N3289 Other specified disorders of bladder: Secondary | ICD-10-CM | POA: Diagnosis not present

## 2017-12-30 DIAGNOSIS — J9811 Atelectasis: Secondary | ICD-10-CM | POA: Diagnosis not present

## 2017-12-30 DIAGNOSIS — I517 Cardiomegaly: Secondary | ICD-10-CM | POA: Diagnosis not present

## 2017-12-30 DIAGNOSIS — R0602 Shortness of breath: Secondary | ICD-10-CM | POA: Diagnosis not present

## 2017-12-30 DIAGNOSIS — I8290 Acute embolism and thrombosis of unspecified vein: Secondary | ICD-10-CM | POA: Diagnosis not present

## 2017-12-30 DIAGNOSIS — E8779 Other fluid overload: Secondary | ICD-10-CM | POA: Diagnosis not present

## 2017-12-30 DIAGNOSIS — J90 Pleural effusion, not elsewhere classified: Secondary | ICD-10-CM | POA: Diagnosis not present

## 2017-12-30 DIAGNOSIS — J9621 Acute and chronic respiratory failure with hypoxia: Secondary | ICD-10-CM | POA: Diagnosis not present

## 2017-12-30 DIAGNOSIS — N186 End stage renal disease: Secondary | ICD-10-CM | POA: Diagnosis not present

## 2017-12-30 DIAGNOSIS — N289 Disorder of kidney and ureter, unspecified: Secondary | ICD-10-CM | POA: Diagnosis not present

## 2017-12-30 DIAGNOSIS — G8929 Other chronic pain: Secondary | ICD-10-CM | POA: Diagnosis not present

## 2017-12-30 DIAGNOSIS — I509 Heart failure, unspecified: Secondary | ICD-10-CM | POA: Diagnosis not present

## 2017-12-30 DIAGNOSIS — Z7901 Long term (current) use of anticoagulants: Secondary | ICD-10-CM | POA: Diagnosis not present

## 2017-12-31 DIAGNOSIS — N186 End stage renal disease: Secondary | ICD-10-CM | POA: Diagnosis not present

## 2017-12-31 DIAGNOSIS — I5023 Acute on chronic systolic (congestive) heart failure: Secondary | ICD-10-CM | POA: Diagnosis not present

## 2017-12-31 DIAGNOSIS — R072 Precordial pain: Secondary | ICD-10-CM | POA: Diagnosis not present

## 2017-12-31 DIAGNOSIS — J9601 Acute respiratory failure with hypoxia: Secondary | ICD-10-CM | POA: Diagnosis not present

## 2017-12-31 DIAGNOSIS — E8779 Other fluid overload: Secondary | ICD-10-CM | POA: Diagnosis not present

## 2017-12-31 DIAGNOSIS — J90 Pleural effusion, not elsewhere classified: Secondary | ICD-10-CM | POA: Diagnosis not present

## 2017-12-31 DIAGNOSIS — I517 Cardiomegaly: Secondary | ICD-10-CM | POA: Diagnosis not present

## 2017-12-31 DIAGNOSIS — Z4582 Encounter for adjustment or removal of myringotomy device (stent) (tube): Secondary | ICD-10-CM | POA: Diagnosis not present

## 2017-12-31 DIAGNOSIS — K8500 Idiopathic acute pancreatitis without necrosis or infection: Secondary | ICD-10-CM | POA: Diagnosis not present

## 2017-12-31 DIAGNOSIS — Z992 Dependence on renal dialysis: Secondary | ICD-10-CM | POA: Diagnosis not present

## 2017-12-31 DIAGNOSIS — I071 Rheumatic tricuspid insufficiency: Secondary | ICD-10-CM | POA: Diagnosis not present

## 2017-12-31 DIAGNOSIS — R918 Other nonspecific abnormal finding of lung field: Secondary | ICD-10-CM | POA: Diagnosis not present

## 2017-12-31 DIAGNOSIS — Z48813 Encounter for surgical aftercare following surgery on the respiratory system: Secondary | ICD-10-CM | POA: Diagnosis not present

## 2017-12-31 DIAGNOSIS — R748 Abnormal levels of other serum enzymes: Secondary | ICD-10-CM | POA: Diagnosis not present

## 2017-12-31 DIAGNOSIS — I272 Pulmonary hypertension, unspecified: Secondary | ICD-10-CM | POA: Diagnosis not present

## 2017-12-31 DIAGNOSIS — I509 Heart failure, unspecified: Secondary | ICD-10-CM | POA: Diagnosis not present

## 2017-12-31 DIAGNOSIS — R079 Chest pain, unspecified: Secondary | ICD-10-CM | POA: Diagnosis not present

## 2017-12-31 DIAGNOSIS — I10 Essential (primary) hypertension: Secondary | ICD-10-CM | POA: Diagnosis not present

## 2018-01-01 DIAGNOSIS — J90 Pleural effusion, not elsewhere classified: Secondary | ICD-10-CM | POA: Diagnosis not present

## 2018-01-01 DIAGNOSIS — R079 Chest pain, unspecified: Secondary | ICD-10-CM | POA: Diagnosis not present

## 2018-01-01 DIAGNOSIS — F419 Anxiety disorder, unspecified: Secondary | ICD-10-CM | POA: Diagnosis present

## 2018-01-01 DIAGNOSIS — F329 Major depressive disorder, single episode, unspecified: Secondary | ICD-10-CM | POA: Diagnosis present

## 2018-01-01 DIAGNOSIS — K859 Acute pancreatitis without necrosis or infection, unspecified: Secondary | ICD-10-CM | POA: Diagnosis present

## 2018-01-01 DIAGNOSIS — Z9989 Dependence on other enabling machines and devices: Secondary | ICD-10-CM | POA: Diagnosis not present

## 2018-01-01 DIAGNOSIS — K863 Pseudocyst of pancreas: Secondary | ICD-10-CM | POA: Diagnosis present

## 2018-01-01 DIAGNOSIS — Z86718 Personal history of other venous thrombosis and embolism: Secondary | ICD-10-CM | POA: Diagnosis not present

## 2018-01-01 DIAGNOSIS — E8779 Other fluid overload: Secondary | ICD-10-CM | POA: Diagnosis present

## 2018-01-01 DIAGNOSIS — J9621 Acute and chronic respiratory failure with hypoxia: Secondary | ICD-10-CM | POA: Diagnosis present

## 2018-01-01 DIAGNOSIS — K219 Gastro-esophageal reflux disease without esophagitis: Secondary | ICD-10-CM | POA: Diagnosis not present

## 2018-01-01 DIAGNOSIS — Z48815 Encounter for surgical aftercare following surgery on the digestive system: Secondary | ICD-10-CM | POA: Diagnosis not present

## 2018-01-01 DIAGNOSIS — Z9115 Patient's noncompliance with renal dialysis: Secondary | ICD-10-CM | POA: Diagnosis not present

## 2018-01-01 DIAGNOSIS — N186 End stage renal disease: Secondary | ICD-10-CM | POA: Diagnosis not present

## 2018-01-01 DIAGNOSIS — Z992 Dependence on renal dialysis: Secondary | ICD-10-CM | POA: Diagnosis not present

## 2018-01-01 DIAGNOSIS — D631 Anemia in chronic kidney disease: Secondary | ICD-10-CM | POA: Diagnosis present

## 2018-01-01 DIAGNOSIS — Z79899 Other long term (current) drug therapy: Secondary | ICD-10-CM | POA: Diagnosis not present

## 2018-01-01 DIAGNOSIS — I509 Heart failure, unspecified: Secondary | ICD-10-CM | POA: Diagnosis not present

## 2018-01-01 DIAGNOSIS — Z888 Allergy status to other drugs, medicaments and biological substances status: Secondary | ICD-10-CM | POA: Diagnosis not present

## 2018-01-01 DIAGNOSIS — J9601 Acute respiratory failure with hypoxia: Secondary | ICD-10-CM | POA: Diagnosis not present

## 2018-01-01 DIAGNOSIS — I132 Hypertensive heart and chronic kidney disease with heart failure and with stage 5 chronic kidney disease, or end stage renal disease: Secondary | ICD-10-CM | POA: Diagnosis present

## 2018-01-01 DIAGNOSIS — G4733 Obstructive sleep apnea (adult) (pediatric): Secondary | ICD-10-CM | POA: Diagnosis present

## 2018-01-01 DIAGNOSIS — K449 Diaphragmatic hernia without obstruction or gangrene: Secondary | ICD-10-CM | POA: Diagnosis not present

## 2018-01-01 DIAGNOSIS — K8500 Idiopathic acute pancreatitis without necrosis or infection: Secondary | ICD-10-CM | POA: Diagnosis not present

## 2018-01-01 DIAGNOSIS — Z4582 Encounter for adjustment or removal of myringotomy device (stent) (tube): Secondary | ICD-10-CM | POA: Diagnosis not present

## 2018-01-01 DIAGNOSIS — J918 Pleural effusion in other conditions classified elsewhere: Secondary | ICD-10-CM | POA: Diagnosis present

## 2018-01-01 DIAGNOSIS — I160 Hypertensive urgency: Secondary | ICD-10-CM | POA: Diagnosis present

## 2018-01-01 DIAGNOSIS — I5022 Chronic systolic (congestive) heart failure: Secondary | ICD-10-CM | POA: Diagnosis present

## 2018-01-01 DIAGNOSIS — I10 Essential (primary) hypertension: Secondary | ICD-10-CM | POA: Diagnosis not present

## 2018-01-01 DIAGNOSIS — Z7901 Long term (current) use of anticoagulants: Secondary | ICD-10-CM | POA: Diagnosis not present

## 2018-01-05 ENCOUNTER — Emergency Department (HOSPITAL_COMMUNITY)
Admission: EM | Admit: 2018-01-05 | Discharge: 2018-01-05 | Disposition: A | Discharge status: Home / Self Care | Payer: Medicare Other | Attending: Emergency Medicine | Admitting: Emergency Medicine

## 2018-01-05 ENCOUNTER — Encounter (HOSPITAL_COMMUNITY): Payer: Self-pay | Admitting: *Deleted

## 2018-01-05 ENCOUNTER — Emergency Department (HOSPITAL_COMMUNITY): Payer: Medicare Other

## 2018-01-05 ENCOUNTER — Other Ambulatory Visit: Payer: Self-pay

## 2018-01-05 DIAGNOSIS — I132 Hypertensive heart and chronic kidney disease with heart failure and with stage 5 chronic kidney disease, or end stage renal disease: Secondary | ICD-10-CM | POA: Diagnosis not present

## 2018-01-05 DIAGNOSIS — I5022 Chronic systolic (congestive) heart failure: Secondary | ICD-10-CM | POA: Diagnosis not present

## 2018-01-05 DIAGNOSIS — R9431 Abnormal electrocardiogram [ECG] [EKG]: Secondary | ICD-10-CM | POA: Diagnosis not present

## 2018-01-05 DIAGNOSIS — Z87891 Personal history of nicotine dependence: Secondary | ICD-10-CM | POA: Diagnosis not present

## 2018-01-05 DIAGNOSIS — E1122 Type 2 diabetes mellitus with diabetic chronic kidney disease: Secondary | ICD-10-CM | POA: Diagnosis not present

## 2018-01-05 DIAGNOSIS — Z79899 Other long term (current) drug therapy: Secondary | ICD-10-CM | POA: Insufficient documentation

## 2018-01-05 DIAGNOSIS — Z992 Dependence on renal dialysis: Secondary | ICD-10-CM | POA: Diagnosis not present

## 2018-01-05 DIAGNOSIS — D631 Anemia in chronic kidney disease: Secondary | ICD-10-CM | POA: Diagnosis not present

## 2018-01-05 DIAGNOSIS — R112 Nausea with vomiting, unspecified: Secondary | ICD-10-CM | POA: Insufficient documentation

## 2018-01-05 DIAGNOSIS — N186 End stage renal disease: Secondary | ICD-10-CM | POA: Diagnosis not present

## 2018-01-05 DIAGNOSIS — N2581 Secondary hyperparathyroidism of renal origin: Secondary | ICD-10-CM | POA: Diagnosis not present

## 2018-01-05 DIAGNOSIS — R1012 Left upper quadrant pain: Secondary | ICD-10-CM | POA: Diagnosis not present

## 2018-01-05 DIAGNOSIS — J811 Chronic pulmonary edema: Secondary | ICD-10-CM | POA: Diagnosis not present

## 2018-01-05 DIAGNOSIS — D509 Iron deficiency anemia, unspecified: Secondary | ICD-10-CM | POA: Diagnosis not present

## 2018-01-05 LAB — CBC WITH DIFFERENTIAL/PLATELET
Abs Immature Granulocytes: 0 10*3/uL (ref 0.0–0.1)
BASOS ABS: 0 10*3/uL (ref 0.0–0.1)
Basophils Relative: 0 %
EOS ABS: 0.3 10*3/uL (ref 0.0–0.7)
EOS PCT: 4 %
HCT: 41.1 % (ref 39.0–52.0)
HEMOGLOBIN: 12.7 g/dL — AB (ref 13.0–17.0)
IMMATURE GRANULOCYTES: 1 %
LYMPHS PCT: 6 %
Lymphs Abs: 0.4 10*3/uL — ABNORMAL LOW (ref 0.7–4.0)
MCH: 27.3 pg (ref 26.0–34.0)
MCHC: 30.9 g/dL (ref 30.0–36.0)
MCV: 88.2 fL (ref 78.0–100.0)
Monocytes Absolute: 0.8 10*3/uL (ref 0.1–1.0)
Monocytes Relative: 11 %
NEUTROS PCT: 78 %
Neutro Abs: 5.8 10*3/uL (ref 1.7–7.7)
Platelets: 130 10*3/uL — ABNORMAL LOW (ref 150–400)
RBC: 4.66 MIL/uL (ref 4.22–5.81)
RDW: 19.3 % — AB (ref 11.5–15.5)
WBC: 7.4 10*3/uL (ref 4.0–10.5)

## 2018-01-05 LAB — COMPREHENSIVE METABOLIC PANEL
ALT: 20 U/L (ref 17–63)
ANION GAP: 11 (ref 5–15)
AST: 62 U/L — ABNORMAL HIGH (ref 15–41)
Albumin: 2.8 g/dL — ABNORMAL LOW (ref 3.5–5.0)
Alkaline Phosphatase: 103 U/L (ref 38–126)
BUN: 23 mg/dL — ABNORMAL HIGH (ref 6–20)
CO2: 30 mmol/L (ref 22–32)
Calcium: 9.1 mg/dL (ref 8.9–10.3)
Chloride: 101 mmol/L (ref 101–111)
Creatinine, Ser: 7.15 mg/dL — ABNORMAL HIGH (ref 0.61–1.24)
GFR calc Af Amer: 9 mL/min — ABNORMAL LOW (ref >60)
GFR calc non Af Amer: 8 mL/min — ABNORMAL LOW (ref >60)
Glucose, Bld: 67 mg/dL (ref 65–99)
POTASSIUM: 5.7 mmol/L — AB (ref 3.5–5.1)
SODIUM: 142 mmol/L (ref 135–145)
Total Bilirubin: 1.7 mg/dL — ABNORMAL HIGH (ref 0.3–1.2)
Total Protein: 8.1 g/dL (ref 6.5–8.1)

## 2018-01-05 LAB — LIPASE, BLOOD: Lipase: 30 U/L (ref 11–51)

## 2018-01-05 MED ORDER — MORPHINE SULFATE (PF) 4 MG/ML IV SOLN
4.0000 mg | Freq: Once | INTRAVENOUS | Status: AC
  Administered 2018-01-05: 4 mg via INTRAVENOUS
  Filled 2018-01-05: qty 1

## 2018-01-05 MED ORDER — ACETAMINOPHEN 325 MG PO TABS
650.00 | ORAL_TABLET | ORAL | Status: DC
Start: ? — End: 2018-01-05

## 2018-01-05 MED ORDER — GENERIC EXTERNAL MEDICATION
Status: DC
Start: ? — End: 2018-01-05

## 2018-01-05 MED ORDER — SODIUM CHLORIDE 0.9 % IJ SOLN
50.00 | INTRAMUSCULAR | Status: DC
Start: ? — End: 2018-01-05

## 2018-01-05 MED ORDER — CARVEDILOL 25 MG PO TABS
25.00 | ORAL_TABLET | ORAL | Status: DC
Start: 2018-01-03 — End: 2018-01-05

## 2018-01-05 MED ORDER — PANTOPRAZOLE SODIUM 40 MG IV SOLR
40.00 | INTRAVENOUS | Status: DC
Start: 2018-01-04 — End: 2018-01-05

## 2018-01-05 MED ORDER — SEVELAMER CARBONATE 800 MG PO TABS
1600.00 | ORAL_TABLET | ORAL | Status: DC
Start: ? — End: 2018-01-05

## 2018-01-05 MED ORDER — PRAVASTATIN SODIUM 40 MG PO TABS
40.00 | ORAL_TABLET | ORAL | Status: DC
Start: 2018-01-03 — End: 2018-01-05

## 2018-01-05 MED ORDER — AMITRIPTYLINE HCL 10 MG PO TABS
10.00 | ORAL_TABLET | ORAL | Status: DC
Start: 2018-01-03 — End: 2018-01-05

## 2018-01-05 MED ORDER — PROMETHAZINE HCL 25 MG/ML IJ SOLN
25.00 | INTRAMUSCULAR | Status: DC
Start: ? — End: 2018-01-05

## 2018-01-05 MED ORDER — ISOSORBIDE MONONITRATE ER 60 MG PO TB24
60.00 | ORAL_TABLET | ORAL | Status: DC
Start: 2018-01-04 — End: 2018-01-05

## 2018-01-05 MED ORDER — DOXAZOSIN MESYLATE 4 MG PO TABS
4.00 | ORAL_TABLET | ORAL | Status: DC
Start: 2018-01-03 — End: 2018-01-05

## 2018-01-05 MED ORDER — CINACALCET HCL 30 MG PO TABS
30.00 | ORAL_TABLET | ORAL | Status: DC
Start: 2018-01-03 — End: 2018-01-05

## 2018-01-05 MED ORDER — ACETAMINOPHEN 650 MG RE SUPP
650.00 | RECTAL | Status: DC
Start: ? — End: 2018-01-05

## 2018-01-05 MED ORDER — ALLOPURINOL 100 MG PO TABS
100.00 | ORAL_TABLET | ORAL | Status: DC
Start: 2018-01-04 — End: 2018-01-05

## 2018-01-05 MED ORDER — MORPHINE SULFATE (PF) 4 MG/ML IV SOLN
4.0000 mg | Freq: Once | INTRAVENOUS | Status: AC
Start: 2018-01-05 — End: 2018-01-05
  Administered 2018-01-05: 4 mg via INTRAVENOUS
  Filled 2018-01-05: qty 1

## 2018-01-05 MED ORDER — ONDANSETRON HCL 4 MG/2ML IJ SOLN
4.0000 mg | Freq: Once | INTRAMUSCULAR | Status: AC
  Administered 2018-01-05: 4 mg via INTRAVENOUS
  Filled 2018-01-05: qty 2

## 2018-01-05 MED ORDER — DIPHENHYDRAMINE HCL 50 MG/ML IJ SOLN
12.50 | INTRAMUSCULAR | Status: DC
Start: ? — End: 2018-01-05

## 2018-01-05 MED ORDER — ONDANSETRON HCL 4 MG PO TABS
4.00 | ORAL_TABLET | ORAL | Status: DC
Start: ? — End: 2018-01-05

## 2018-01-05 MED ORDER — LISINOPRIL 20 MG PO TABS
40.00 | ORAL_TABLET | ORAL | Status: DC
Start: 2018-01-04 — End: 2018-01-05

## 2018-01-05 MED ORDER — HYDRALAZINE HCL 50 MG PO TABS
100.00 | ORAL_TABLET | ORAL | Status: DC
Start: 2018-01-03 — End: 2018-01-05

## 2018-01-05 MED ORDER — NITROGLYCERIN 0.4 MG SL SUBL
0.40 | SUBLINGUAL_TABLET | SUBLINGUAL | Status: DC
Start: ? — End: 2018-01-05

## 2018-01-05 MED ORDER — ALBUMIN HUMAN 25 % IV SOLN
12.50 | INTRAVENOUS | Status: DC
Start: ? — End: 2018-01-05

## 2018-01-05 MED ORDER — TRAMADOL HCL 50 MG PO TABS
25.00 | ORAL_TABLET | ORAL | Status: DC
Start: ? — End: 2018-01-05

## 2018-01-05 MED ORDER — AMLODIPINE BESYLATE 10 MG PO TABS
10.00 | ORAL_TABLET | ORAL | Status: DC
Start: 2018-01-03 — End: 2018-01-05

## 2018-01-05 MED ORDER — HYDRALAZINE HCL 20 MG/ML IJ SOLN
10.00 | INTRAMUSCULAR | Status: DC
Start: ? — End: 2018-01-05

## 2018-01-05 MED ORDER — SODIUM CHLORIDE 0.9 % IV SOLN
150.00 | INTRAVENOUS | Status: DC
Start: ? — End: 2018-01-05

## 2018-01-05 MED ORDER — SODIUM CHLORIDE 0.9 % IV SOLN
10.00 | INTRAVENOUS | Status: DC
Start: ? — End: 2018-01-05

## 2018-01-05 MED ORDER — ALUM & MAG HYDROXIDE-SIMETH 200-200-20 MG/5ML PO SUSP
30.0000 mL | Freq: Once | ORAL | Status: AC
  Administered 2018-01-05: 30 mL via ORAL
  Filled 2018-01-05: qty 30

## 2018-01-05 MED ORDER — SEVELAMER CARBONATE 800 MG PO TABS
2400.00 | ORAL_TABLET | ORAL | Status: DC
Start: 2018-01-03 — End: 2018-01-05

## 2018-01-05 MED ORDER — GABAPENTIN 100 MG PO CAPS
100.00 | ORAL_CAPSULE | ORAL | Status: DC
Start: 2018-01-03 — End: 2018-01-05

## 2018-01-05 MED ORDER — FAMOTIDINE IN NACL 20-0.9 MG/50ML-% IV SOLN
20.0000 mg | Freq: Once | INTRAVENOUS | Status: AC
  Administered 2018-01-05: 20 mg via INTRAVENOUS
  Filled 2018-01-05: qty 50

## 2018-01-05 NOTE — ED Notes (Signed)
Walked in to find pt sitting on the stool out of bed and when asked to lay back down in bed, pt refused to move over and allow the side rail to be put up, stating "my nurse knows it's down because I have to get up to throw up"  Pt now laying on his side with the side rail down.
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
## 2018-01-05 NOTE — ED Provider Notes (Signed)
Patient placed in Quick Look pathway, seen and evaluated   Chief Complaint: vomiting blood  HPI:   Pt reports her abdomen is swollen.  Pt reports vomiting  ROS: no chest pain  Physical Exam:   Gen: No distress  Neuro: Awake and Alert  Skin: Warm    Focused Exam: distended abdomen   Initiation of care has begun. The patient has been counseled on the process, plan, and necessity for staying for the completion/evaluation, and the remainder of the medical screening examination   Sidney Ace 01/05/18 1705    Valarie Merino, MD 01/05/18 2351

## 2018-01-05 NOTE — ED Triage Notes (Signed)
Pt in from dialysis stating he was vomiting blood, pt pale and diaphoretic

## 2018-01-05 NOTE — Care Management (Signed)
ED CM received call from [place] concerning patient. Patient was dialyzed today. ED evaluation still in progress.
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
## 2018-01-05 NOTE — Discharge Instructions (Addendum)
Please return for any problem.

## 2018-01-05 NOTE — ED Notes (Signed)
Pt verbalizes understanding of d/c instructions. Pt taken to lobby in wheelchair at d/c with all belongings.

## 2018-01-05 NOTE — ED Notes (Signed)
Xray at bedside

## 2018-01-06 ENCOUNTER — Encounter (HOSPITAL_COMMUNITY): Payer: Self-pay

## 2018-01-06 ENCOUNTER — Emergency Department (HOSPITAL_COMMUNITY)
Admission: EM | Admit: 2018-01-06 | Discharge: 2018-01-06 | Disposition: A | Discharge status: Home / Self Care | Payer: Medicare Other | Attending: Emergency Medicine | Admitting: Emergency Medicine

## 2018-01-06 DIAGNOSIS — N186 End stage renal disease: Secondary | ICD-10-CM | POA: Insufficient documentation

## 2018-01-06 DIAGNOSIS — Z87891 Personal history of nicotine dependence: Secondary | ICD-10-CM | POA: Diagnosis not present

## 2018-01-06 DIAGNOSIS — I5042 Chronic combined systolic (congestive) and diastolic (congestive) heart failure: Secondary | ICD-10-CM | POA: Diagnosis not present

## 2018-01-06 DIAGNOSIS — E1122 Type 2 diabetes mellitus with diabetic chronic kidney disease: Secondary | ICD-10-CM | POA: Diagnosis not present

## 2018-01-06 DIAGNOSIS — I132 Hypertensive heart and chronic kidney disease with heart failure and with stage 5 chronic kidney disease, or end stage renal disease: Secondary | ICD-10-CM | POA: Insufficient documentation

## 2018-01-06 DIAGNOSIS — R112 Nausea with vomiting, unspecified: Secondary | ICD-10-CM | POA: Diagnosis not present

## 2018-01-06 DIAGNOSIS — Z7901 Long term (current) use of anticoagulants: Secondary | ICD-10-CM | POA: Insufficient documentation

## 2018-01-06 DIAGNOSIS — I120 Hypertensive chronic kidney disease with stage 5 chronic kidney disease or end stage renal disease: Secondary | ICD-10-CM | POA: Diagnosis not present

## 2018-01-06 DIAGNOSIS — Z86711 Personal history of pulmonary embolism: Secondary | ICD-10-CM | POA: Insufficient documentation

## 2018-01-06 DIAGNOSIS — Z992 Dependence on renal dialysis: Secondary | ICD-10-CM | POA: Insufficient documentation

## 2018-01-06 DIAGNOSIS — Z79899 Other long term (current) drug therapy: Secondary | ICD-10-CM | POA: Insufficient documentation

## 2018-01-06 DIAGNOSIS — R1013 Epigastric pain: Secondary | ICD-10-CM | POA: Insufficient documentation

## 2018-01-06 LAB — CBC WITH DIFFERENTIAL/PLATELET
Abs Immature Granulocytes: 0 10*3/uL (ref 0.0–0.1)
BASOS ABS: 0 10*3/uL (ref 0.0–0.1)
BASOS PCT: 0 %
EOS ABS: 0.2 10*3/uL (ref 0.0–0.7)
Eosinophils Relative: 4 %
HCT: 36.2 % — ABNORMAL LOW (ref 39.0–52.0)
Hemoglobin: 11.2 g/dL — ABNORMAL LOW (ref 13.0–17.0)
IMMATURE GRANULOCYTES: 0 %
Lymphocytes Relative: 7 %
Lymphs Abs: 0.4 10*3/uL — ABNORMAL LOW (ref 0.7–4.0)
MCH: 27.1 pg (ref 26.0–34.0)
MCHC: 30.9 g/dL (ref 30.0–36.0)
MCV: 87.4 fL (ref 78.0–100.0)
MONOS PCT: 14 %
Monocytes Absolute: 0.8 10*3/uL (ref 0.1–1.0)
NEUTROS ABS: 4.4 10*3/uL (ref 1.7–7.7)
NEUTROS PCT: 75 %
PLATELETS: 122 10*3/uL — AB (ref 150–400)
RBC: 4.14 MIL/uL — ABNORMAL LOW (ref 4.22–5.81)
RDW: 18.7 % — AB (ref 11.5–15.5)
WBC: 5.9 10*3/uL (ref 4.0–10.5)

## 2018-01-06 LAB — COMPREHENSIVE METABOLIC PANEL
ALK PHOS: 83 U/L (ref 38–126)
ALT: 12 U/L — AB (ref 17–63)
ANION GAP: 11 (ref 5–15)
AST: 41 U/L (ref 15–41)
Albumin: 2.3 g/dL — ABNORMAL LOW (ref 3.5–5.0)
BILIRUBIN TOTAL: 1.6 mg/dL — AB (ref 0.3–1.2)
BUN: 26 mg/dL — ABNORMAL HIGH (ref 6–20)
CALCIUM: 8.7 mg/dL — AB (ref 8.9–10.3)
CO2: 28 mmol/L (ref 22–32)
CREATININE: 8.15 mg/dL — AB (ref 0.61–1.24)
Chloride: 101 mmol/L (ref 101–111)
GFR calc non Af Amer: 7 mL/min — ABNORMAL LOW (ref >60)
GFR, EST AFRICAN AMERICAN: 8 mL/min — AB (ref >60)
Glucose, Bld: 69 mg/dL (ref 65–99)
Potassium: 5.9 mmol/L — ABNORMAL HIGH (ref 3.5–5.1)
SODIUM: 140 mmol/L (ref 135–145)
TOTAL PROTEIN: 6.5 g/dL (ref 6.5–8.1)

## 2018-01-06 LAB — ETHANOL

## 2018-01-06 LAB — LIPASE, BLOOD: Lipase: 20 U/L (ref 11–51)

## 2018-01-06 IMAGING — DX DG CHEST 2V
2 series · 2 of 2 positions shown · non-contrast
Comparison: 08/10/2015.

CLINICAL DATA: Chest pain and shortness of breath while at dialysis
today.

EXAM:
CHEST  2 VIEW

[chest lat]
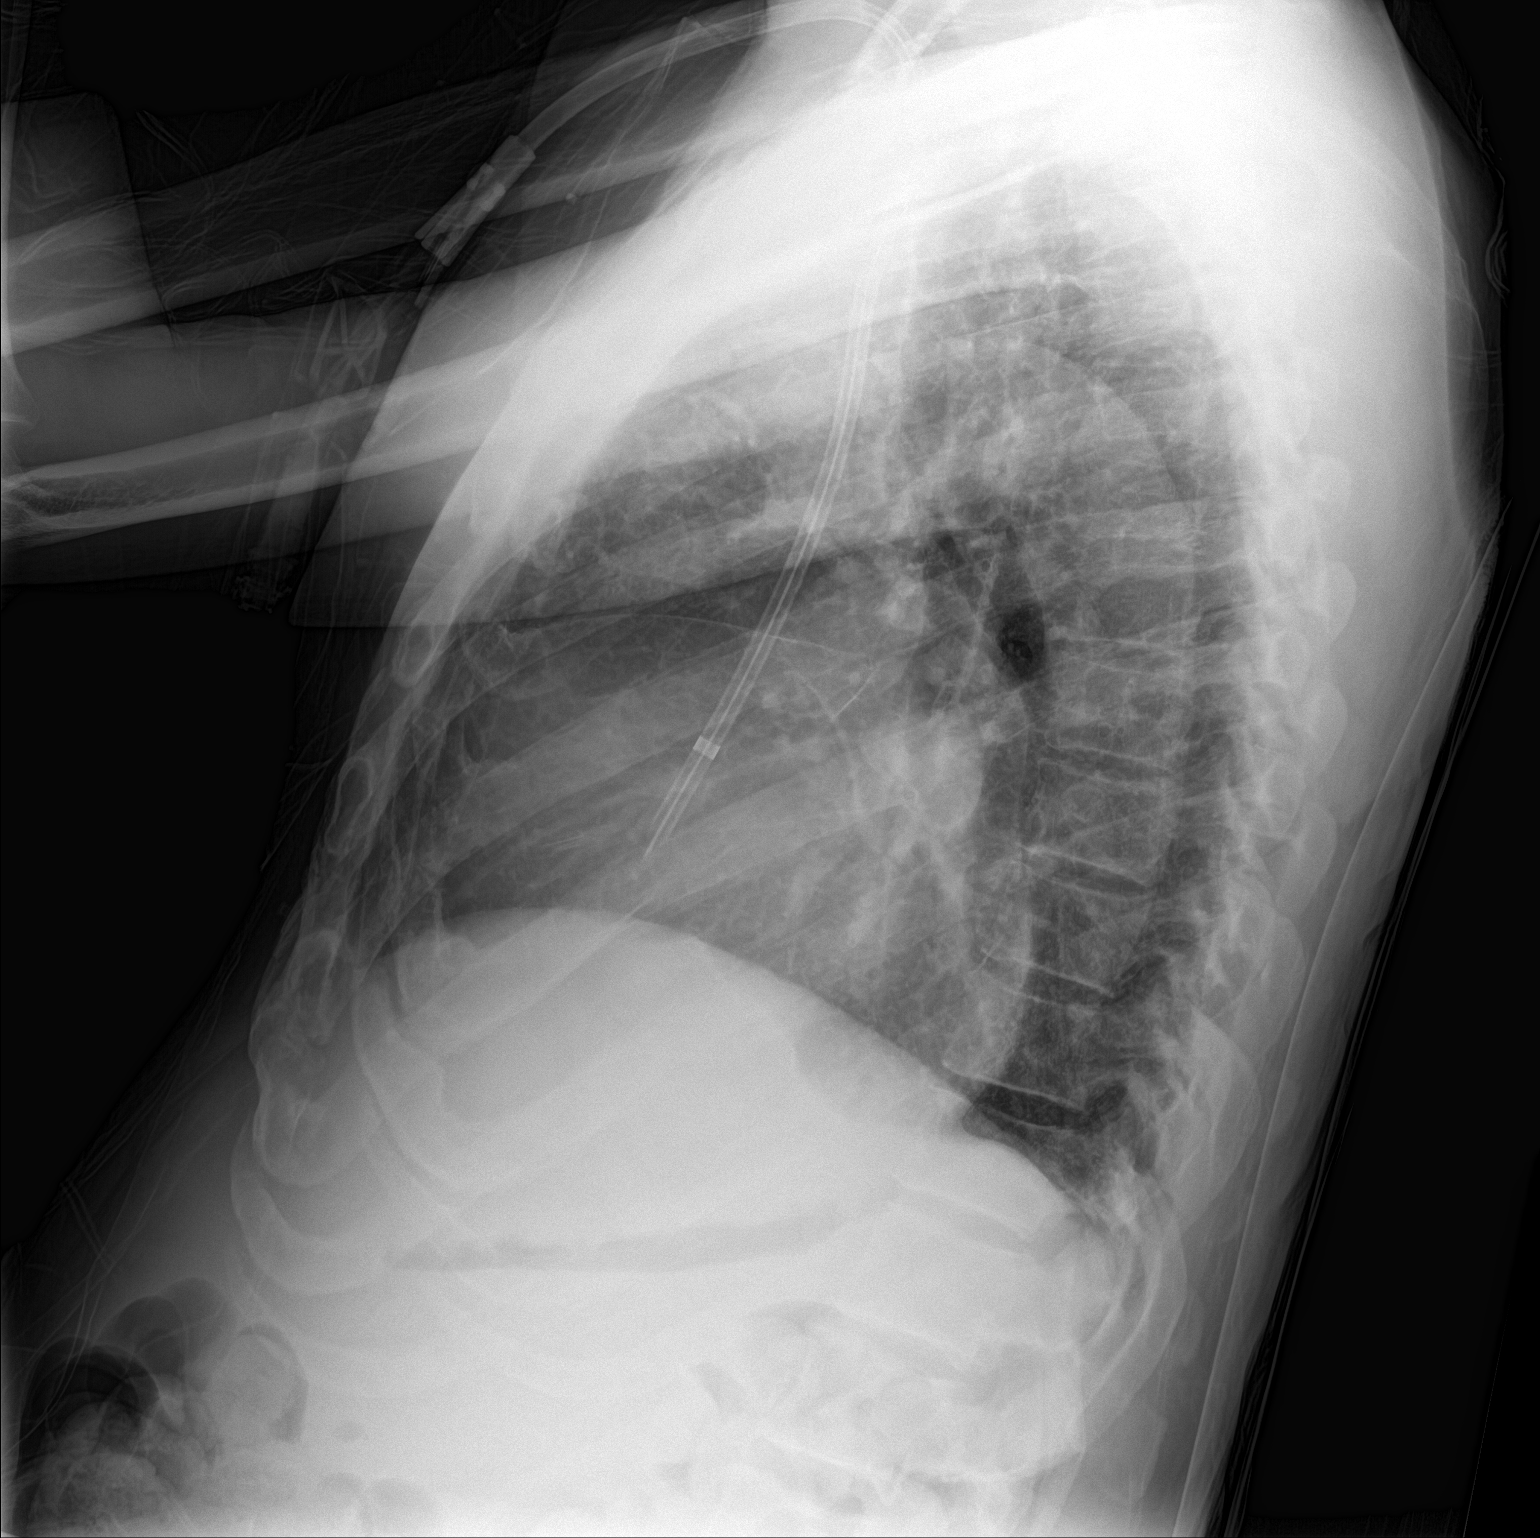

[chest ap]
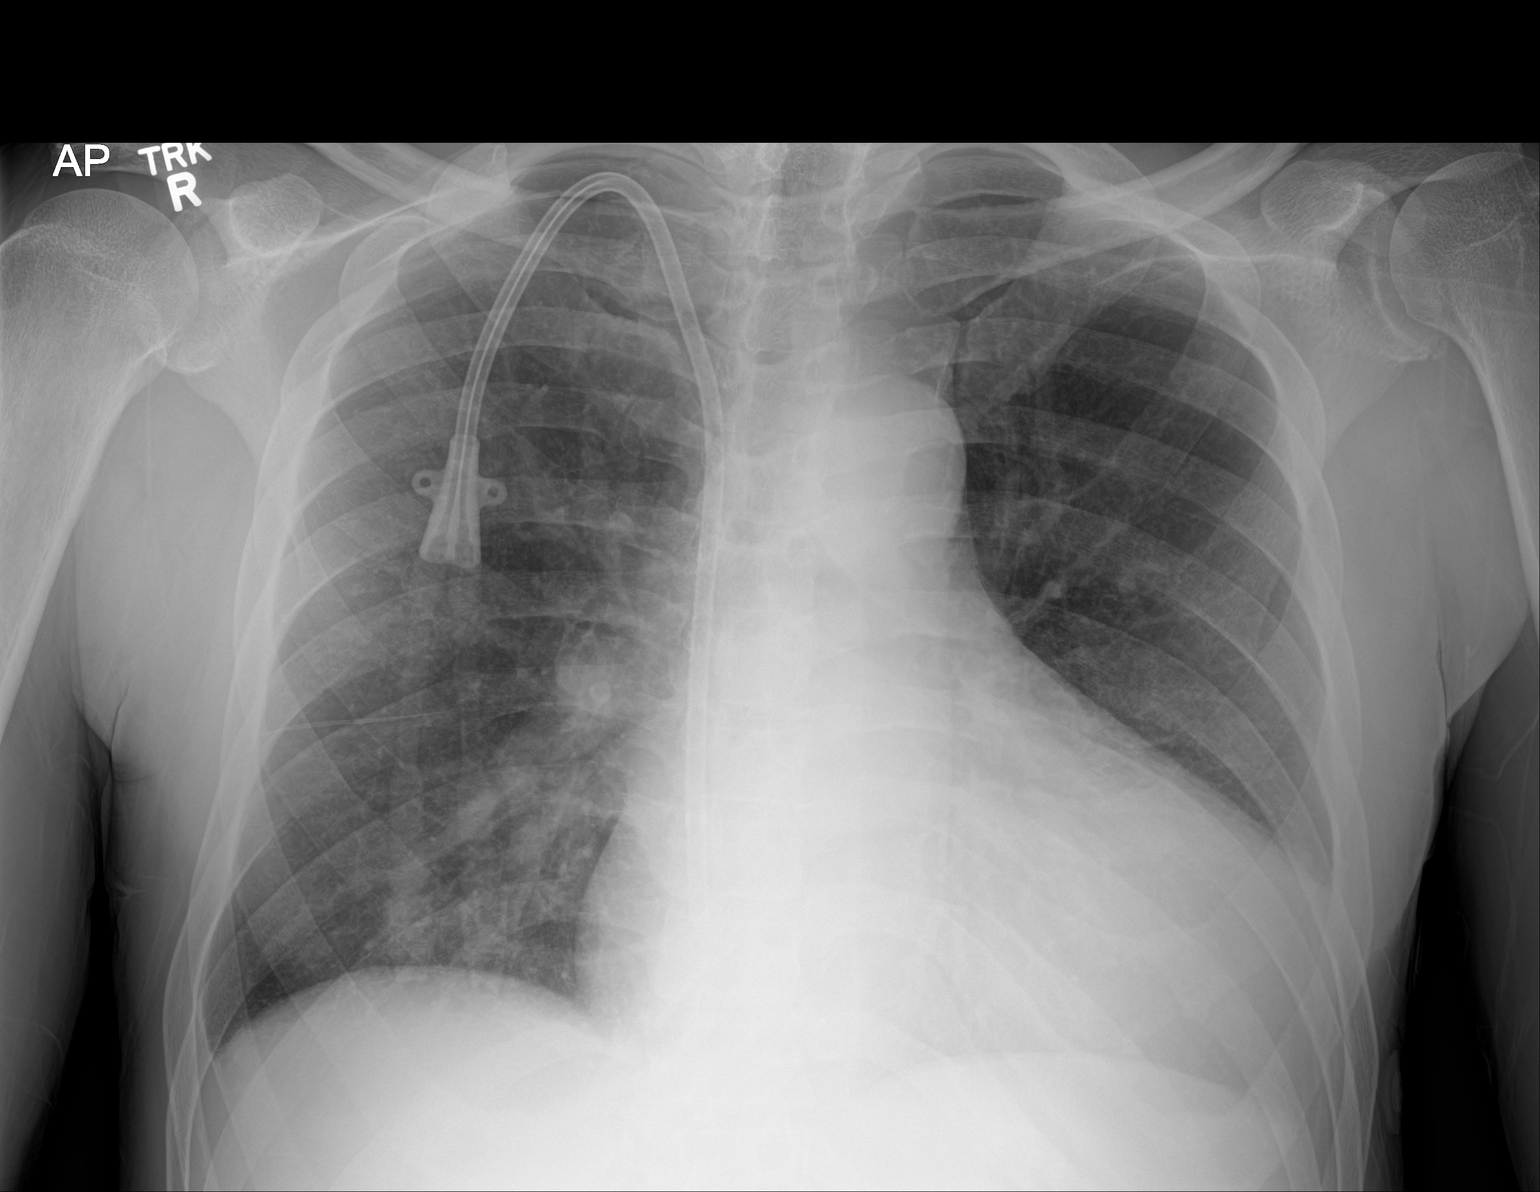

[2 of 2 positions shown; findings below may reference images not displayed]

FINDINGS: The cardio pericardial silhouette is enlarged. There is pulmonary
vascular congestion without overt pulmonary edema. The lungs are
clear wiithout focal pneumonia, pneumothorax or pleural effusion.
Right IJ dialysis catheter tip projects at the level of the mid
right atrium. The visualized bony structures of the thorax are
intact.
IMPRESSION: Cardiomegaly with vascular congestion.

## 2018-01-06 MED ORDER — ONDANSETRON 4 MG PO TBDP
4.0000 mg | ORAL_TABLET | Freq: Once | ORAL | Status: AC | PRN
  Administered 2018-01-06: 4 mg via ORAL
  Filled 2018-01-06: qty 1

## 2018-01-06 MED ORDER — SODIUM POLYSTYRENE SULFONATE 15 GM/60ML PO SUSP
30.0000 g | Freq: Once | ORAL | Status: AC
  Administered 2018-01-06: 30 g via ORAL
  Filled 2018-01-06: qty 120

## 2018-01-06 MED ORDER — ONDANSETRON HCL 4 MG/2ML IJ SOLN
4.0000 mg | Freq: Once | INTRAMUSCULAR | Status: AC
  Administered 2018-01-06: 4 mg via INTRAVENOUS
  Filled 2018-01-06: qty 2

## 2018-01-06 MED ORDER — MORPHINE SULFATE (PF) 4 MG/ML IV SOLN
4.0000 mg | Freq: Once | INTRAVENOUS | Status: AC
  Administered 2018-01-06: 4 mg via INTRAVENOUS
  Filled 2018-01-06: qty 1

## 2018-01-06 MED ORDER — ONDANSETRON HCL 4 MG PO TABS: 4.0000 mg | ORAL_TABLET | Freq: Four times a day (QID) | ORAL | 0 refills | Status: DC | PRN

## 2018-01-06 NOTE — ED Triage Notes (Signed)
Pt seen here today for abd pain, never left the waiting room, vomited x one and decided to check back in.

## 2018-01-06 NOTE — ED Notes (Signed)
Pt refused labs.

## 2018-01-06 NOTE — ED Notes (Signed)
IV team and phlebotomy attempted blood draw over 6 times with no success.

## 2018-01-06 NOTE — Discharge Instructions (Signed)
Increase your pantoprazole (Protonix) to twice a day for the next two weeks.

## 2018-01-06 NOTE — ED Notes (Signed)
Pt requesting pain medication.  Spoke with EDP, Tylenol was offered to pt.  Pt refused and stated he only wants Morphine.
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
## 2018-01-06 NOTE — ED Notes (Addendum)
Pt walked over to nursing station requesting apple juice.   Pt also stating nausea has subsided.

## 2018-01-06 NOTE — ED Provider Notes (Signed)
Patient checked out to me by Dr. [name] at 1:84 AM, to evaluate after labs returned.  Patient called me to his bedside at 8:05 AM, because he did not want to take Tylenol which I ordered for pain, before I saw him.  He states that Tylenol will do nothing for him that he needs morphine.  He states that he had a tube in his abdomen, last month, when he was treated at [facility] in [facility].  Patient's nephrologist, Dr. Justin Mend called me at 8:10 AM to inquire about patient's needs for nephrology intervention.  He acknowledges that the patient was dialyzed yesterday.  Dr. Justin Mend notes that the patient has chronic pancreatitis, and frequently is treated with pain medication. At this time the patient does not appear to be in fluid overload, his potassium is mildly elevated, this will be treated and will attempt to assess the patient's abdominal pain.  At this time patient is ambulating easily.  His abdomen is mildly distended.  Patient was admitted at [facility], 12/30/2017 and discharged 4 days later.  During admission he had removal of axios stent, which have been placed for treatment of "pancreatic pseudocyst."  Patient was discharged on 01/03/2018 with 20 Percocet tablets.  He was instructed to use them to every 6 hours as needed pain.  8:22 AM-at this point patient states that if he gets another dose of pain and nausea medicine he will be willing to go home.  Will order these and also treat with Kayexalate for hyperkalemia.   Patient Vitals for the past 24 hrs:  BP Temp Temp src Pulse Resp SpO2  01/06/18 0748 (!) 154/79 97.9 F (36.6 C) Oral 97 18 90 %  01/06/18 0159 (!) 181/120 98.1 F (36.7 C) Oral (!) 103 18 96 %    10:54 AM Reevaluation with update and discussion. After initial assessment and treatment, an updated evaluation reveals at this time he is more comfortable and states he is ready to go home.  He states that he has Percocet at home which was recently prescribed,  to treat his abdominal pain.  He has tolerated the Kayexalate here, without vomiting. Daleen Bo   Medical Decision Making: Nonspecific vomiting, reported to be hematemesis, this is apparently resolved and now he is producing nonbloody sputum.  He is hemodynamically stable.  He has tolerated Kayexalate given for hyperkalemia.  He has chronic abdominal pain with recurrent episodes of "pancreatitis."  Doubt complication related to recent pancreatic duct stenting.  No indication for further evaluation or treatment in the ED or hospital setting.  CRITICAL CARE-no Performed by: Daleen Bo     9:10 AM-he is currently coughing, producing green-colored sputum.  No vomiting or hematemesis in the last hour.  He has not been able to tolerate the Kayexalate yet.  His pain is better after morphine.  We will continue to observe.   Nursing Notes Reviewed/ Care Coordinated Applicable Imaging Reviewed Interpretation of Laboratory Data incorporated into ED treatment  The patient appears reasonably screened and/or stabilized for discharge and I doubt any other medical condition or other [facility] requiring further screening, evaluation, or treatment in the ED at this time prior to discharge.  Plan: Home Medications-continue usual home medications; Home Treatments-continue dialysis as scheduled; return here if the recommended treatment, does not improve the symptoms; Recommended follow up-nephrology and PCP, as needed.    Daleen Bo, MD 01/06/18 1056

## 2018-01-06 NOTE — ED Notes (Signed)
Pt stable, ambulatory, and verbalizes understanding of d/c instructions.

## 2018-01-06 NOTE — ED Notes (Signed)
Pt's fistula began bleeding in the lobby while being moved to ED bed. Dressed with gauze and pressure of coban dressing

## 2018-01-07 DIAGNOSIS — D631 Anemia in chronic kidney disease: Secondary | ICD-10-CM | POA: Diagnosis not present

## 2018-01-07 DIAGNOSIS — Z79899 Other long term (current) drug therapy: Secondary | ICD-10-CM | POA: Diagnosis not present

## 2018-01-07 DIAGNOSIS — R1032 Left lower quadrant pain: Secondary | ICD-10-CM | POA: Diagnosis not present

## 2018-01-07 DIAGNOSIS — Z992 Dependence on renal dialysis: Secondary | ICD-10-CM | POA: Diagnosis not present

## 2018-01-07 DIAGNOSIS — N2581 Secondary hyperparathyroidism of renal origin: Secondary | ICD-10-CM | POA: Diagnosis not present

## 2018-01-07 DIAGNOSIS — Z888 Allergy status to other drugs, medicaments and biological substances status: Secondary | ICD-10-CM | POA: Diagnosis not present

## 2018-01-07 DIAGNOSIS — D509 Iron deficiency anemia, unspecified: Secondary | ICD-10-CM | POA: Diagnosis not present

## 2018-01-07 DIAGNOSIS — I120 Hypertensive chronic kidney disease with stage 5 chronic kidney disease or end stage renal disease: Secondary | ICD-10-CM | POA: Diagnosis not present

## 2018-01-07 DIAGNOSIS — I132 Hypertensive heart and chronic kidney disease with heart failure and with stage 5 chronic kidney disease, or end stage renal disease: Secondary | ICD-10-CM | POA: Diagnosis not present

## 2018-01-07 DIAGNOSIS — K5900 Constipation, unspecified: Secondary | ICD-10-CM | POA: Diagnosis not present

## 2018-01-07 DIAGNOSIS — N186 End stage renal disease: Secondary | ICD-10-CM | POA: Diagnosis not present

## 2018-01-07 DIAGNOSIS — I509 Heart failure, unspecified: Secondary | ICD-10-CM | POA: Diagnosis not present

## 2018-01-07 DIAGNOSIS — Z7901 Long term (current) use of anticoagulants: Secondary | ICD-10-CM | POA: Diagnosis not present

## 2018-01-07 DIAGNOSIS — N2889 Other specified disorders of kidney and ureter: Secondary | ICD-10-CM | POA: Diagnosis not present

## 2018-01-07 LAB — TYPE AND SCREEN
ABO/RH(D): B POS
Antibody Screen: POSITIVE
DAT, IgG: POSITIVE
DONOR AG TYPE: NEGATIVE
Donor AG Type: NEGATIVE
Unit division: 0
Unit division: 0

## 2018-01-07 LAB — BPAM RBC
BLOOD PRODUCT EXPIRATION DATE: 201907132359
Blood Product Expiration Date: 201907132359
UNIT TYPE AND RH: 7300
Unit Type and Rh: 7300

## 2018-01-07 IMAGING — CR DG CHEST 1V PORT
1 series · 1 of 1 positions shown · non-contrast
Comparison: 08/18/2015

CLINICAL DATA: 49-year-old male with shortness of breath. End-stage
renal disease.

EXAM:
PORTABLE CHEST 1 VIEW

[AP]
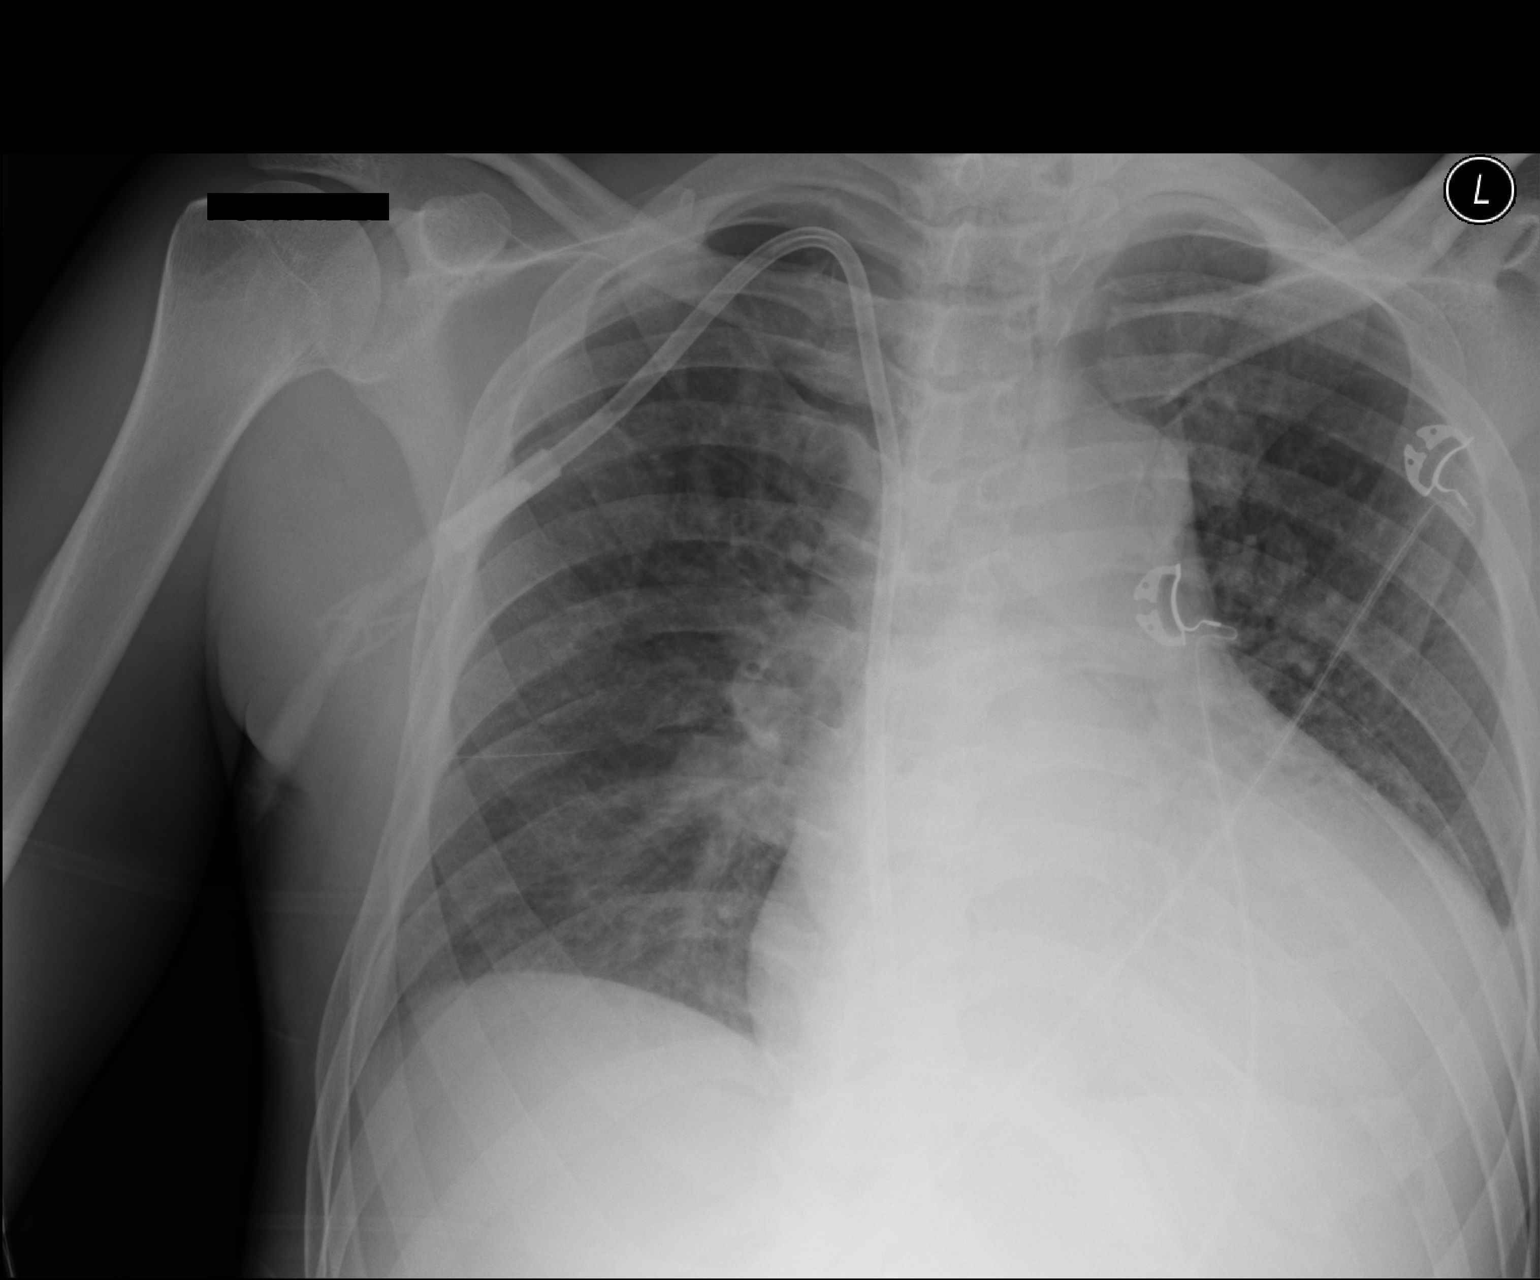

[1 of 1 positions shown; findings below may reference images not displayed]

FINDINGS: Cardiomegaly and pulmonary vascular congestion noted.

Slightly increased left retrocardiac opacity may represent
atelectasis.

Trace bilateral pleural effusions are present.

There is no evidence of pneumothorax.

A right IJ central venous catheter is present with tip overlying the
upper right atrium.
IMPRESSION: Little significant change except for possible development of left
lower lung atelectasis.

Cardiomegaly, pulmonary vascular congestion and trace pleural
effusions again noted.

## 2018-01-08 DIAGNOSIS — R197 Diarrhea, unspecified: Secondary | ICD-10-CM | POA: Diagnosis not present

## 2018-01-08 DIAGNOSIS — Z9119 Patient's noncompliance with other medical treatment and regimen: Secondary | ICD-10-CM | POA: Diagnosis not present

## 2018-01-08 DIAGNOSIS — Z87891 Personal history of nicotine dependence: Secondary | ICD-10-CM | POA: Diagnosis not present

## 2018-01-08 DIAGNOSIS — D631 Anemia in chronic kidney disease: Secondary | ICD-10-CM | POA: Diagnosis not present

## 2018-01-08 DIAGNOSIS — R05 Cough: Secondary | ICD-10-CM | POA: Diagnosis not present

## 2018-01-08 DIAGNOSIS — R1084 Generalized abdominal pain: Secondary | ICD-10-CM | POA: Diagnosis not present

## 2018-01-08 DIAGNOSIS — Z86718 Personal history of other venous thrombosis and embolism: Secondary | ICD-10-CM | POA: Diagnosis not present

## 2018-01-08 DIAGNOSIS — J90 Pleural effusion, not elsewhere classified: Secondary | ICD-10-CM | POA: Diagnosis not present

## 2018-01-08 DIAGNOSIS — Z86711 Personal history of pulmonary embolism: Secondary | ICD-10-CM | POA: Diagnosis not present

## 2018-01-08 DIAGNOSIS — R1110 Vomiting, unspecified: Secondary | ICD-10-CM | POA: Diagnosis not present

## 2018-01-08 DIAGNOSIS — Z7901 Long term (current) use of anticoagulants: Secondary | ICD-10-CM | POA: Diagnosis not present

## 2018-01-08 DIAGNOSIS — K861 Other chronic pancreatitis: Secondary | ICD-10-CM | POA: Diagnosis not present

## 2018-01-08 DIAGNOSIS — G8929 Other chronic pain: Secondary | ICD-10-CM | POA: Diagnosis not present

## 2018-01-08 DIAGNOSIS — Z992 Dependence on renal dialysis: Secondary | ICD-10-CM | POA: Diagnosis not present

## 2018-01-08 DIAGNOSIS — M7989 Other specified soft tissue disorders: Secondary | ICD-10-CM | POA: Diagnosis not present

## 2018-01-08 DIAGNOSIS — N186 End stage renal disease: Secondary | ICD-10-CM | POA: Diagnosis not present

## 2018-01-08 DIAGNOSIS — K219 Gastro-esophageal reflux disease without esophagitis: Secondary | ICD-10-CM | POA: Diagnosis not present

## 2018-01-08 DIAGNOSIS — T8612 Kidney transplant failure: Secondary | ICD-10-CM | POA: Diagnosis not present

## 2018-01-08 DIAGNOSIS — R140 Abdominal distension (gaseous): Secondary | ICD-10-CM | POA: Diagnosis not present

## 2018-01-08 DIAGNOSIS — Z09 Encounter for follow-up examination after completed treatment for conditions other than malignant neoplasm: Secondary | ICD-10-CM | POA: Diagnosis not present

## 2018-01-08 DIAGNOSIS — Z79899 Other long term (current) drug therapy: Secondary | ICD-10-CM | POA: Diagnosis not present

## 2018-01-08 DIAGNOSIS — I5042 Chronic combined systolic (congestive) and diastolic (congestive) heart failure: Secondary | ICD-10-CM | POA: Diagnosis not present

## 2018-01-08 DIAGNOSIS — E162 Hypoglycemia, unspecified: Secondary | ICD-10-CM | POA: Diagnosis not present

## 2018-01-08 DIAGNOSIS — I132 Hypertensive heart and chronic kidney disease with heart failure and with stage 5 chronic kidney disease, or end stage renal disease: Secondary | ICD-10-CM | POA: Diagnosis not present

## 2018-01-08 DIAGNOSIS — R531 Weakness: Secondary | ICD-10-CM | POA: Diagnosis not present

## 2018-01-09 DIAGNOSIS — T8611 Kidney transplant rejection: Secondary | ICD-10-CM | POA: Diagnosis not present

## 2018-01-09 DIAGNOSIS — R05 Cough: Secondary | ICD-10-CM | POA: Diagnosis not present

## 2018-01-09 DIAGNOSIS — I5020 Unspecified systolic (congestive) heart failure: Secondary | ICD-10-CM | POA: Diagnosis not present

## 2018-01-09 DIAGNOSIS — Z992 Dependence on renal dialysis: Secondary | ICD-10-CM | POA: Diagnosis not present

## 2018-01-09 DIAGNOSIS — E8770 Fluid overload, unspecified: Secondary | ICD-10-CM | POA: Diagnosis not present

## 2018-01-09 DIAGNOSIS — K219 Gastro-esophageal reflux disease without esophagitis: Secondary | ICD-10-CM | POA: Diagnosis not present

## 2018-01-09 DIAGNOSIS — I132 Hypertensive heart and chronic kidney disease with heart failure and with stage 5 chronic kidney disease, or end stage renal disease: Secondary | ICD-10-CM | POA: Diagnosis not present

## 2018-01-09 DIAGNOSIS — Z85528 Personal history of other malignant neoplasm of kidney: Secondary | ICD-10-CM | POA: Diagnosis not present

## 2018-01-09 DIAGNOSIS — R1084 Generalized abdominal pain: Secondary | ICD-10-CM | POA: Diagnosis not present

## 2018-01-09 DIAGNOSIS — N186 End stage renal disease: Secondary | ICD-10-CM | POA: Diagnosis not present

## 2018-01-09 DIAGNOSIS — Z9115 Patient's noncompliance with renal dialysis: Secondary | ICD-10-CM | POA: Diagnosis not present

## 2018-01-09 DIAGNOSIS — I509 Heart failure, unspecified: Secondary | ICD-10-CM | POA: Diagnosis not present

## 2018-01-09 DIAGNOSIS — Z79899 Other long term (current) drug therapy: Secondary | ICD-10-CM | POA: Diagnosis not present

## 2018-01-09 DIAGNOSIS — D631 Anemia in chronic kidney disease: Secondary | ICD-10-CM | POA: Diagnosis not present

## 2018-01-09 DIAGNOSIS — Z86718 Personal history of other venous thrombosis and embolism: Secondary | ICD-10-CM | POA: Diagnosis not present

## 2018-01-09 DIAGNOSIS — Z86711 Personal history of pulmonary embolism: Secondary | ICD-10-CM | POA: Diagnosis not present

## 2018-01-09 DIAGNOSIS — E162 Hypoglycemia, unspecified: Secondary | ICD-10-CM | POA: Diagnosis not present

## 2018-01-09 DIAGNOSIS — R109 Unspecified abdominal pain: Secondary | ICD-10-CM | POA: Diagnosis not present

## 2018-01-09 DIAGNOSIS — K861 Other chronic pancreatitis: Secondary | ICD-10-CM | POA: Diagnosis not present

## 2018-01-09 DIAGNOSIS — I5040 Unspecified combined systolic (congestive) and diastolic (congestive) heart failure: Secondary | ICD-10-CM | POA: Diagnosis not present

## 2018-01-09 DIAGNOSIS — Z87891 Personal history of nicotine dependence: Secondary | ICD-10-CM | POA: Diagnosis not present

## 2018-01-09 DIAGNOSIS — R1032 Left lower quadrant pain: Secondary | ICD-10-CM | POA: Diagnosis not present

## 2018-01-09 DIAGNOSIS — T8612 Kidney transplant failure: Secondary | ICD-10-CM | POA: Diagnosis not present

## 2018-01-09 DIAGNOSIS — Z7901 Long term (current) use of anticoagulants: Secondary | ICD-10-CM | POA: Diagnosis not present

## 2018-01-10 DIAGNOSIS — Z86718 Personal history of other venous thrombosis and embolism: Secondary | ICD-10-CM | POA: Diagnosis not present

## 2018-01-10 DIAGNOSIS — K861 Other chronic pancreatitis: Secondary | ICD-10-CM | POA: Diagnosis not present

## 2018-01-10 DIAGNOSIS — Z7901 Long term (current) use of anticoagulants: Secondary | ICD-10-CM | POA: Diagnosis not present

## 2018-01-10 DIAGNOSIS — Z86711 Personal history of pulmonary embolism: Secondary | ICD-10-CM | POA: Diagnosis not present

## 2018-01-10 DIAGNOSIS — D631 Anemia in chronic kidney disease: Secondary | ICD-10-CM | POA: Diagnosis not present

## 2018-01-10 DIAGNOSIS — E8770 Fluid overload, unspecified: Secondary | ICD-10-CM | POA: Diagnosis not present

## 2018-01-10 DIAGNOSIS — T8611 Kidney transplant rejection: Secondary | ICD-10-CM | POA: Diagnosis not present

## 2018-01-10 DIAGNOSIS — K219 Gastro-esophageal reflux disease without esophagitis: Secondary | ICD-10-CM | POA: Diagnosis not present

## 2018-01-10 DIAGNOSIS — T8612 Kidney transplant failure: Secondary | ICD-10-CM | POA: Diagnosis not present

## 2018-01-10 DIAGNOSIS — E162 Hypoglycemia, unspecified: Secondary | ICD-10-CM | POA: Diagnosis not present

## 2018-01-10 DIAGNOSIS — Z85528 Personal history of other malignant neoplasm of kidney: Secondary | ICD-10-CM | POA: Diagnosis not present

## 2018-01-10 DIAGNOSIS — I5040 Unspecified combined systolic (congestive) and diastolic (congestive) heart failure: Secondary | ICD-10-CM | POA: Diagnosis not present

## 2018-01-10 DIAGNOSIS — I132 Hypertensive heart and chronic kidney disease with heart failure and with stage 5 chronic kidney disease, or end stage renal disease: Secondary | ICD-10-CM | POA: Diagnosis not present

## 2018-01-10 DIAGNOSIS — R109 Unspecified abdominal pain: Secondary | ICD-10-CM | POA: Diagnosis not present

## 2018-01-10 DIAGNOSIS — Z87891 Personal history of nicotine dependence: Secondary | ICD-10-CM | POA: Diagnosis not present

## 2018-01-10 DIAGNOSIS — N186 End stage renal disease: Secondary | ICD-10-CM | POA: Diagnosis not present

## 2018-01-10 DIAGNOSIS — Z992 Dependence on renal dialysis: Secondary | ICD-10-CM | POA: Diagnosis not present

## 2018-01-10 DIAGNOSIS — I5020 Unspecified systolic (congestive) heart failure: Secondary | ICD-10-CM | POA: Diagnosis not present

## 2018-01-10 DIAGNOSIS — Z9115 Patient's noncompliance with renal dialysis: Secondary | ICD-10-CM | POA: Diagnosis not present

## 2018-01-12 LAB — DRUG SCREEN 10 W/CONF, SERUM

## 2018-01-15 ENCOUNTER — Other Ambulatory Visit: Payer: Self-pay

## 2018-01-15 ENCOUNTER — Inpatient Hospital Stay (HOSPITAL_COMMUNITY)
Admission: EM | Admit: 2018-01-15 | Discharge: 2018-01-19 | DRG: 388 | Disposition: A | Discharge status: Home / Self Care | Payer: Medicare Other | Attending: Internal Medicine | Admitting: Internal Medicine

## 2018-01-15 ENCOUNTER — Encounter (HOSPITAL_COMMUNITY): Payer: Self-pay | Admitting: Emergency Medicine

## 2018-01-15 DIAGNOSIS — Z8249 Family history of ischemic heart disease and other diseases of the circulatory system: Secondary | ICD-10-CM

## 2018-01-15 DIAGNOSIS — J90 Pleural effusion, not elsewhere classified: Secondary | ICD-10-CM | POA: Diagnosis not present

## 2018-01-15 DIAGNOSIS — I2699 Other pulmonary embolism without acute cor pulmonale: Secondary | ICD-10-CM | POA: Diagnosis present

## 2018-01-15 DIAGNOSIS — I132 Hypertensive heart and chronic kidney disease with heart failure and with stage 5 chronic kidney disease, or end stage renal disease: Secondary | ICD-10-CM | POA: Diagnosis present

## 2018-01-15 DIAGNOSIS — R112 Nausea with vomiting, unspecified: Secondary | ICD-10-CM | POA: Diagnosis not present

## 2018-01-15 DIAGNOSIS — D631 Anemia in chronic kidney disease: Secondary | ICD-10-CM | POA: Diagnosis present

## 2018-01-15 DIAGNOSIS — K861 Other chronic pancreatitis: Secondary | ICD-10-CM | POA: Diagnosis not present

## 2018-01-15 DIAGNOSIS — Z87891 Personal history of nicotine dependence: Secondary | ICD-10-CM

## 2018-01-15 DIAGNOSIS — N189 Chronic kidney disease, unspecified: Secondary | ICD-10-CM

## 2018-01-15 DIAGNOSIS — Z992 Dependence on renal dialysis: Secondary | ICD-10-CM | POA: Diagnosis not present

## 2018-01-15 DIAGNOSIS — I428 Other cardiomyopathies: Secondary | ICD-10-CM | POA: Diagnosis present

## 2018-01-15 DIAGNOSIS — Z86718 Personal history of other venous thrombosis and embolism: Secondary | ICD-10-CM | POA: Diagnosis present

## 2018-01-15 DIAGNOSIS — R1012 Left upper quadrant pain: Secondary | ICD-10-CM | POA: Diagnosis not present

## 2018-01-15 DIAGNOSIS — I5042 Chronic combined systolic (congestive) and diastolic (congestive) heart failure: Secondary | ICD-10-CM | POA: Diagnosis present

## 2018-01-15 DIAGNOSIS — R6889 Other general symptoms and signs: Secondary | ICD-10-CM | POA: Diagnosis not present

## 2018-01-15 DIAGNOSIS — K56609 Unspecified intestinal obstruction, unspecified as to partial versus complete obstruction: Secondary | ICD-10-CM | POA: Diagnosis not present

## 2018-01-15 DIAGNOSIS — N186 End stage renal disease: Secondary | ICD-10-CM | POA: Diagnosis present

## 2018-01-15 DIAGNOSIS — E1159 Type 2 diabetes mellitus with other circulatory complications: Secondary | ICD-10-CM | POA: Diagnosis present

## 2018-01-15 DIAGNOSIS — I4581 Long QT syndrome: Secondary | ICD-10-CM | POA: Diagnosis present

## 2018-01-15 DIAGNOSIS — E11649 Type 2 diabetes mellitus with hypoglycemia without coma: Secondary | ICD-10-CM | POA: Diagnosis not present

## 2018-01-15 DIAGNOSIS — E1122 Type 2 diabetes mellitus with diabetic chronic kidney disease: Secondary | ICD-10-CM | POA: Diagnosis present

## 2018-01-15 DIAGNOSIS — F419 Anxiety disorder, unspecified: Secondary | ICD-10-CM | POA: Diagnosis present

## 2018-01-15 DIAGNOSIS — R778 Other specified abnormalities of plasma proteins: Secondary | ICD-10-CM | POA: Diagnosis present

## 2018-01-15 DIAGNOSIS — N2581 Secondary hyperparathyroidism of renal origin: Secondary | ICD-10-CM | POA: Diagnosis present

## 2018-01-15 DIAGNOSIS — K56600 Partial intestinal obstruction, unspecified as to cause: Principal | ICD-10-CM | POA: Diagnosis present

## 2018-01-15 DIAGNOSIS — G8929 Other chronic pain: Secondary | ICD-10-CM | POA: Diagnosis present

## 2018-01-15 DIAGNOSIS — F329 Major depressive disorder, single episode, unspecified: Secondary | ICD-10-CM | POA: Diagnosis present

## 2018-01-15 DIAGNOSIS — I152 Hypertension secondary to endocrine disorders: Secondary | ICD-10-CM | POA: Diagnosis present

## 2018-01-15 DIAGNOSIS — Z7901 Long term (current) use of anticoagulants: Secondary | ICD-10-CM

## 2018-01-15 DIAGNOSIS — Z5320 Procedure and treatment not carried out because of patient's decision for unspecified reasons: Secondary | ICD-10-CM | POA: Diagnosis not present

## 2018-01-15 DIAGNOSIS — I10 Essential (primary) hypertension: Secondary | ICD-10-CM | POA: Diagnosis present

## 2018-01-15 DIAGNOSIS — R7989 Other specified abnormal findings of blood chemistry: Secondary | ICD-10-CM

## 2018-01-15 DIAGNOSIS — R1111 Vomiting without nausea: Secondary | ICD-10-CM | POA: Diagnosis not present

## 2018-01-15 DIAGNOSIS — K219 Gastro-esophageal reflux disease without esophagitis: Secondary | ICD-10-CM | POA: Diagnosis present

## 2018-01-15 DIAGNOSIS — I82721 Chronic embolism and thrombosis of deep veins of right upper extremity: Secondary | ICD-10-CM | POA: Diagnosis present

## 2018-01-15 DIAGNOSIS — Z886 Allergy status to analgesic agent status: Secondary | ICD-10-CM

## 2018-01-15 DIAGNOSIS — M898X9 Other specified disorders of bone, unspecified site: Secondary | ICD-10-CM | POA: Diagnosis present

## 2018-01-15 DIAGNOSIS — J918 Pleural effusion in other conditions classified elsewhere: Secondary | ICD-10-CM | POA: Diagnosis present

## 2018-01-15 DIAGNOSIS — Z79891 Long term (current) use of opiate analgesic: Secondary | ICD-10-CM

## 2018-01-15 DIAGNOSIS — D509 Iron deficiency anemia, unspecified: Secondary | ICD-10-CM | POA: Diagnosis not present

## 2018-01-15 DIAGNOSIS — Z9115 Patient's noncompliance with renal dialysis: Secondary | ICD-10-CM

## 2018-01-15 DIAGNOSIS — N2889 Other specified disorders of kidney and ureter: Secondary | ICD-10-CM | POA: Diagnosis not present

## 2018-01-15 DIAGNOSIS — R110 Nausea: Secondary | ICD-10-CM | POA: Diagnosis not present

## 2018-01-15 DIAGNOSIS — Z86711 Personal history of pulmonary embolism: Secondary | ICD-10-CM

## 2018-01-15 DIAGNOSIS — Z885 Allergy status to narcotic agent status: Secondary | ICD-10-CM

## 2018-01-15 DIAGNOSIS — K56699 Other intestinal obstruction unspecified as to partial versus complete obstruction: Secondary | ICD-10-CM | POA: Diagnosis not present

## 2018-01-15 DIAGNOSIS — R1084 Generalized abdominal pain: Secondary | ICD-10-CM | POA: Diagnosis not present

## 2018-01-15 DIAGNOSIS — Z888 Allergy status to other drugs, medicaments and biological substances status: Secondary | ICD-10-CM

## 2018-01-15 HISTORY — DX: Unspecified intestinal obstruction, unspecified as to partial versus complete obstruction: K56.609

## 2018-01-15 LAB — PROTIME-INR: INR: 1.2 — AB (ref 0.9–1.1)

## 2018-01-15 NOTE — ED Triage Notes (Signed)
Per EMS, pt coming from home with complaints of left upper quadrant abdominal pain. Pt went to dialysis today and the pain started shortly after dialysis was completed around 4pm. Pt stated he vomited a white substance. Pt is HTN 198/100.

## 2018-01-16 ENCOUNTER — Emergency Department (HOSPITAL_COMMUNITY): Payer: Medicare Other

## 2018-01-16 ENCOUNTER — Encounter (HOSPITAL_COMMUNITY): Payer: Self-pay | Admitting: General Practice

## 2018-01-16 DIAGNOSIS — E1129 Type 2 diabetes mellitus with other diabetic kidney complication: Secondary | ICD-10-CM

## 2018-01-16 DIAGNOSIS — N2581 Secondary hyperparathyroidism of renal origin: Secondary | ICD-10-CM | POA: Diagnosis not present

## 2018-01-16 DIAGNOSIS — F419 Anxiety disorder, unspecified: Secondary | ICD-10-CM | POA: Diagnosis present

## 2018-01-16 DIAGNOSIS — Z5320 Procedure and treatment not carried out because of patient's decision for unspecified reasons: Secondary | ICD-10-CM | POA: Diagnosis not present

## 2018-01-16 DIAGNOSIS — I2699 Other pulmonary embolism without acute cor pulmonale: Secondary | ICD-10-CM | POA: Diagnosis present

## 2018-01-16 DIAGNOSIS — F329 Major depressive disorder, single episode, unspecified: Secondary | ICD-10-CM | POA: Diagnosis present

## 2018-01-16 DIAGNOSIS — E1122 Type 2 diabetes mellitus with diabetic chronic kidney disease: Secondary | ICD-10-CM | POA: Diagnosis present

## 2018-01-16 DIAGNOSIS — R748 Abnormal levels of other serum enzymes: Secondary | ICD-10-CM | POA: Diagnosis not present

## 2018-01-16 DIAGNOSIS — I4581 Long QT syndrome: Secondary | ICD-10-CM | POA: Diagnosis present

## 2018-01-16 DIAGNOSIS — E1165 Type 2 diabetes mellitus with hyperglycemia: Secondary | ICD-10-CM

## 2018-01-16 DIAGNOSIS — N2889 Other specified disorders of kidney and ureter: Secondary | ICD-10-CM | POA: Diagnosis not present

## 2018-01-16 DIAGNOSIS — Z992 Dependence on renal dialysis: Secondary | ICD-10-CM | POA: Diagnosis not present

## 2018-01-16 DIAGNOSIS — J90 Pleural effusion, not elsewhere classified: Secondary | ICD-10-CM | POA: Diagnosis present

## 2018-01-16 DIAGNOSIS — R1012 Left upper quadrant pain: Secondary | ICD-10-CM | POA: Diagnosis not present

## 2018-01-16 DIAGNOSIS — K56609 Unspecified intestinal obstruction, unspecified as to partial versus complete obstruction: Secondary | ICD-10-CM | POA: Diagnosis not present

## 2018-01-16 DIAGNOSIS — K56600 Partial intestinal obstruction, unspecified as to cause: Secondary | ICD-10-CM | POA: Diagnosis not present

## 2018-01-16 DIAGNOSIS — I10 Essential (primary) hypertension: Secondary | ICD-10-CM | POA: Diagnosis not present

## 2018-01-16 DIAGNOSIS — I5042 Chronic combined systolic (congestive) and diastolic (congestive) heart failure: Secondary | ICD-10-CM | POA: Diagnosis present

## 2018-01-16 DIAGNOSIS — K861 Other chronic pancreatitis: Secondary | ICD-10-CM | POA: Diagnosis present

## 2018-01-16 DIAGNOSIS — J918 Pleural effusion in other conditions classified elsewhere: Secondary | ICD-10-CM | POA: Diagnosis present

## 2018-01-16 DIAGNOSIS — Z86711 Personal history of pulmonary embolism: Secondary | ICD-10-CM | POA: Diagnosis not present

## 2018-01-16 DIAGNOSIS — I82721 Chronic embolism and thrombosis of deep veins of right upper extremity: Secondary | ICD-10-CM | POA: Diagnosis present

## 2018-01-16 DIAGNOSIS — G8929 Other chronic pain: Secondary | ICD-10-CM | POA: Diagnosis present

## 2018-01-16 DIAGNOSIS — N186 End stage renal disease: Secondary | ICD-10-CM | POA: Diagnosis not present

## 2018-01-16 DIAGNOSIS — D631 Anemia in chronic kidney disease: Secondary | ICD-10-CM | POA: Diagnosis not present

## 2018-01-16 DIAGNOSIS — I428 Other cardiomyopathies: Secondary | ICD-10-CM | POA: Diagnosis present

## 2018-01-16 DIAGNOSIS — M898X9 Other specified disorders of bone, unspecified site: Secondary | ICD-10-CM | POA: Diagnosis present

## 2018-01-16 DIAGNOSIS — R7989 Other specified abnormal findings of blood chemistry: Secondary | ICD-10-CM

## 2018-01-16 DIAGNOSIS — Z87891 Personal history of nicotine dependence: Secondary | ICD-10-CM | POA: Diagnosis not present

## 2018-01-16 DIAGNOSIS — Z9115 Patient's noncompliance with renal dialysis: Secondary | ICD-10-CM | POA: Diagnosis not present

## 2018-01-16 DIAGNOSIS — I132 Hypertensive heart and chronic kidney disease with heart failure and with stage 5 chronic kidney disease, or end stage renal disease: Secondary | ICD-10-CM | POA: Diagnosis present

## 2018-01-16 DIAGNOSIS — I120 Hypertensive chronic kidney disease with stage 5 chronic kidney disease or end stage renal disease: Secondary | ICD-10-CM | POA: Diagnosis not present

## 2018-01-16 DIAGNOSIS — E11649 Type 2 diabetes mellitus with hypoglycemia without coma: Secondary | ICD-10-CM | POA: Diagnosis not present

## 2018-01-16 DIAGNOSIS — K219 Gastro-esophageal reflux disease without esophagitis: Secondary | ICD-10-CM | POA: Diagnosis not present

## 2018-01-16 DIAGNOSIS — R778 Other specified abnormalities of plasma proteins: Secondary | ICD-10-CM | POA: Diagnosis present

## 2018-01-16 DIAGNOSIS — I82A11 Acute embolism and thrombosis of right axillary vein: Secondary | ICD-10-CM | POA: Diagnosis not present

## 2018-01-16 DIAGNOSIS — Z886 Allergy status to analgesic agent status: Secondary | ICD-10-CM | POA: Diagnosis not present

## 2018-01-16 HISTORY — DX: Other pulmonary embolism without acute cor pulmonale: I26.99

## 2018-01-16 LAB — GLUCOSE, CAPILLARY
GLUCOSE-CAPILLARY: 48 mg/dL — AB (ref 70–99)
GLUCOSE-CAPILLARY: 83 mg/dL (ref 70–99)
GLUCOSE-CAPILLARY: 57 mg/dL — AB (ref 70–99)
Glucose-Capillary: 79 mg/dL (ref 70–99)
Glucose-Capillary: 129 mg/dL — ABNORMAL HIGH (ref 70–99)

## 2018-01-16 LAB — COMPREHENSIVE METABOLIC PANEL
ALK PHOS: 82 U/L (ref 38–126)
ALT: 11 U/L (ref 0–44)
AST: 30 U/L (ref 15–41)
Albumin: 2.8 g/dL — ABNORMAL LOW (ref 3.5–5.0)
Anion gap: 15 (ref 5–15)
BUN: 35 mg/dL — AB (ref 6–20)
CALCIUM: 9.1 mg/dL (ref 8.9–10.3)
CHLORIDE: 99 mmol/L (ref 98–111)
CO2: 23 mmol/L (ref 22–32)
CREATININE: 9.39 mg/dL — AB (ref 0.61–1.24)
GFR calc Af Amer: 6 mL/min — ABNORMAL LOW (ref >60)
GFR calc non Af Amer: 6 mL/min — ABNORMAL LOW (ref >60)
Glucose, Bld: 75 mg/dL (ref 70–99)
Potassium: 4.5 mmol/L (ref 3.5–5.1)
SODIUM: 137 mmol/L (ref 135–145)
Total Bilirubin: 1.1 mg/dL (ref 0.3–1.2)
Total Protein: 7.6 g/dL (ref 6.5–8.1)

## 2018-01-16 LAB — CBC WITH DIFFERENTIAL/PLATELET
ABS IMMATURE GRANULOCYTES: 0 10*3/uL (ref 0.0–0.1)
BASOS PCT: 0 %
Basophils Absolute: 0 10*3/uL (ref 0.0–0.1)
Eosinophils Absolute: 0.2 10*3/uL (ref 0.0–0.7)
Eosinophils Relative: 4 %
HCT: 36.1 % — ABNORMAL LOW (ref 39.0–52.0)
HEMOGLOBIN: 11.3 g/dL — AB (ref 13.0–17.0)
IMMATURE GRANULOCYTES: 1 %
LYMPHS PCT: 8 %
Lymphs Abs: 0.5 10*3/uL — ABNORMAL LOW (ref 0.7–4.0)
MCH: 26.7 pg (ref 26.0–34.0)
MCHC: 31.3 g/dL (ref 30.0–36.0)
MCV: 85.3 fL (ref 78.0–100.0)
Monocytes Absolute: 0.7 10*3/uL (ref 0.1–1.0)
Monocytes Relative: 12 %
NEUTROS ABS: 4.2 10*3/uL (ref 1.7–7.7)
NEUTROS PCT: 75 %
PLATELETS: 234 10*3/uL (ref 150–400)
RBC: 4.23 MIL/uL (ref 4.22–5.81)
RDW: 18 % — ABNORMAL HIGH (ref 11.5–15.5)
WBC: 5.6 10*3/uL (ref 4.0–10.5)

## 2018-01-16 LAB — TROPONIN I
Troponin I: 0.04 ng/mL (ref <0.03)
Troponin I: 0.04 ng/mL (ref <0.03)

## 2018-01-16 LAB — MRSA PCR SCREENING: MRSA by PCR: NEGATIVE

## 2018-01-16 LAB — LIPASE, BLOOD: Lipase: 37 U/L (ref 11–51)

## 2018-01-16 MED ORDER — HYDROMORPHONE HCL 1 MG/ML IJ SOLN
1.0000 mg | Freq: Once | INTRAMUSCULAR | Status: AC
Start: 2018-01-16 — End: 2018-01-16
  Administered 2018-01-16: 1 mg via INTRAVENOUS
  Filled 2018-01-16: qty 1

## 2018-01-16 MED ORDER — DOXERCALCIFEROL 2.5 MCG PO CAPS: 7.0000 ug | ORAL_CAPSULE | ORAL | Status: DC

## 2018-01-16 MED ORDER — DEXTROSE 50 % IV SOLN
INTRAVENOUS | Status: AC
  Administered 2018-01-16: 50 mL
  Filled 2018-01-16: qty 50

## 2018-01-16 MED ORDER — MORPHINE SULFATE (PF) 2 MG/ML IV SOLN
2.0000 mg | Freq: Once | INTRAVENOUS | Status: AC
  Administered 2018-01-16: 2 mg via INTRAVENOUS
  Filled 2018-01-16: qty 1

## 2018-01-16 MED ORDER — MORPHINE SULFATE (PF) 4 MG/ML IV SOLN: 2.0000 mg | INTRAVENOUS | Status: DC | PRN

## 2018-01-16 MED ORDER — ENOXAPARIN SODIUM 30 MG/0.3ML ~~LOC~~ SOLN
30.0000 mg | SUBCUTANEOUS | Status: DC
  Administered 2018-01-16 – 2018-01-17 (×2): 30 mg via SUBCUTANEOUS
  Filled 2018-01-16 (×2): qty 0.3

## 2018-01-16 MED ORDER — DIATRIZOATE MEGLUMINE & SODIUM 66-10 % PO SOLN
90.0000 mL | Freq: Once | ORAL | Status: AC
  Administered 2018-01-16: 90 mL via NASOGASTRIC
  Filled 2018-01-16: qty 90

## 2018-01-16 MED ORDER — MUPIROCIN 2 % EX OINT
1.0000 "application " | TOPICAL_OINTMENT | Freq: Two times a day (BID) | CUTANEOUS | Status: DC
  Administered 2018-01-16 – 2018-01-17 (×2): 1 via NASAL
  Filled 2018-01-16 (×2): qty 22

## 2018-01-16 MED ORDER — HYDRALAZINE HCL 20 MG/ML IJ SOLN
5.0000 mg | INTRAMUSCULAR | Status: DC | PRN
  Administered 2018-01-18 – 2018-01-19 (×5): 5 mg via INTRAVENOUS
  Filled 2018-01-16 (×4): qty 1

## 2018-01-16 MED ORDER — ONDANSETRON HCL 4 MG/2ML IJ SOLN
4.0000 mg | Freq: Once | INTRAMUSCULAR | Status: AC
Start: 2018-01-16 — End: 2018-01-16
  Administered 2018-01-16: 4 mg via INTRAVENOUS
  Filled 2018-01-16: qty 2

## 2018-01-16 MED ORDER — CHLORHEXIDINE GLUCONATE CLOTH 2 % EX PADS
6.0000 | MEDICATED_PAD | Freq: Every day | CUTANEOUS | Status: DC
  Administered 2018-01-16: 6 via TOPICAL

## 2018-01-16 MED ORDER — MORPHINE SULFATE (PF) 4 MG/ML IV SOLN
2.0000 mg | INTRAVENOUS | Status: DC | PRN
  Administered 2018-01-16 – 2018-01-17 (×8): 2 mg via INTRAVENOUS
  Filled 2018-01-16 (×8): qty 1

## 2018-01-16 MED ORDER — DEXTROSE 5 % IV SOLN
INTRAVENOUS | Status: DC
  Administered 2018-01-16: 16:00:00 via INTRAVENOUS

## 2018-01-16 MED ORDER — HEPARIN (PORCINE) IN NACL 100-0.45 UNIT/ML-% IJ SOLN
1500.0000 [IU]/h | INTRAMUSCULAR | Status: DC
  Administered 2018-01-16: 1500 [IU]/h via INTRAVENOUS
  Filled 2018-01-16: qty 250

## 2018-01-16 MED ORDER — HYDRALAZINE HCL 20 MG/ML IJ SOLN
10.0000 mg | Freq: Once | INTRAMUSCULAR | Status: AC
  Administered 2018-01-16: 10 mg via INTRAVENOUS
  Filled 2018-01-16: qty 1

## 2018-01-16 MED ORDER — GI COCKTAIL ~~LOC~~
30.0000 mL | Freq: Once | ORAL | Status: DC
  Filled 2018-01-16: qty 30

## 2018-01-16 MED ORDER — HYDROXYZINE HCL 50 MG/ML IM SOLN
25.0000 mg | Freq: Four times a day (QID) | INTRAMUSCULAR | Status: DC | PRN
  Administered 2018-01-18: 25 mg via INTRAMUSCULAR
  Filled 2018-01-16 (×3): qty 0.5

## 2018-01-16 MED ORDER — DEXTROSE 50 % IV SOLN
INTRAVENOUS | Status: AC
  Administered 2018-01-16: 25 mL
  Filled 2018-01-16: qty 50

## 2018-01-16 MED ORDER — HYDROMORPHONE HCL 1 MG/ML IJ SOLN
1.0000 mg | Freq: Once | INTRAMUSCULAR | Status: AC
  Administered 2018-01-16: 1 mg via INTRAVENOUS
  Filled 2018-01-16: qty 1

## 2018-01-16 MED ORDER — SODIUM CHLORIDE 0.9 % IV BOLUS: 500.0000 mL | Freq: Once | INTRAVENOUS | Status: DC
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
## 2018-01-16 NOTE — Progress Notes (Signed)
Pt given instructions on purpose of NG tube. Pt refused and states he wants something to eat.  Given update on orders for NPO status. Pt frowned but no further discussion.  Pain of 9/10 continues.
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
## 2018-01-16 NOTE — Progress Notes (Addendum)
Hypoglycemic Event  CBG: 48   Treatment: D50 IV 25 mL  Symptoms: Nervous/irritable  Follow-up CBG: Time:1530 CBG Result:83  Possible Reasons for Event: Inadequate meal intake  Comments/MD notified:Will notify     Viva Gallaher Margaretha Sheffield

## 2018-01-16 NOTE — Progress Notes (Addendum)
[place] for heparin Indication: h/o PE/DVT  Labs: Recent Labs    01/16/18 0002 01/16/18 0312  HGB 11.3*  --   HCT 36.1*  --   PLT 234  --   CREATININE 9.39*  --   TROPONINI 0.04* 0.04*    Estimated Creatinine Clearance: 9.9 mL/min (A) (by C-G formula based on SCr of 9.39 mg/dL (H)).   Assessment: 52yo male w/ h/o multiple admissions and known medical noncompliance now admitted w/ SBO, to transition from Eliquis to heparin in anticipation of possible need for surgery; pt states that his last dose of Eliquis was two days ago. Last fill of Eliquis was 11/11/17 for 30d supply.  He is refusing aPTT levels. Spoke with Dr [name] about this and ok to change to Lovenox for VTE prophylaxis.   Plan:  Discontinue heparin drip Lovenox 30 mg SQ q24h   Renold Genta, PharmD, BCPS Clinical Pharmacist Clinical phone for 01/16/2018 until 3p is [phone] Please check AMION for all Pharmacist numbers by unit 01/16/2018 2:52 PM    Addendum: Patient agreed to aPTT however he is a hard stick and phlebotomy was unable to draw any blood. Ginger, RN spoke with Dr [name] about this and he was ok with continuing Lovenox.  Renold Genta, PharmD, BCPS 3:25 PM
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
## 2018-01-16 NOTE — Progress Notes (Signed)
[facility] PROGRESS NOTE  Frank [name] ZOX:[id number] DOB: 04/04/1966 DOA: 01/15/2018  PCP: Patient, [name]  Brief History/Interval Summary: 52 year old African-American male with a past medical history of end-stage renal disease on dialysis on Monday Wednesday Friday, chronically elevated troponin, chronic pancreatitis, history of DVT and PE on Eliquis, history of noncompliance presented with nausea vomiting and abdominal pain.  Evaluation in the emergency department included CT scan which shows findings suggestive of small bowel obstruction.  Patient was hospitalized for further management.  Reason for Visit: Small bowel obstruction  Consultants: Nephrology.  General surgery.  Procedures: None yet  Antibiotics: None  Subjective/Interval History: Patient continues to have abdominal pain which is mainly located in the left side of his abdomen.  8 out of 10 in intensity.  No nausea vomiting since last night.  He has refused NG tube.  ROS: Denies any chest pain or shortness of breath  Objective:  Vital Signs  Vitals:   01/16/18 0500 01/16/18 0530 01/16/18 0559 01/16/18 0800  BP: (!) 157/97 (!) 160/101 (!) 173/109 (!) 160/108  Pulse: 93 92 (!) 101   Resp: _0 Temp:   97.8 F (36.6 C) 98 F (36.7 C)  TempSrc:   Oral Oral  SpO2: 91% 94% 93% 93%  Weight:   76.9 kg (169 lb 8.5 oz)   Height:       No intake or output data in the 24 hours ending 01/16/18 1328 Filed Weights   01/15/18 2352 01/16/18 0559  Weight: 74.8 kg (165 lb) 76.9 kg (169 lb 8.5 oz)    General appearance: alert, cooperative, appears stated age and no distress Head: Normocephalic, without obvious abnormality, atraumatic Resp: Diminished air entry in the right.  Few crackles.  No wheezing or rhonchi.  Normal effort at rest. Cardio: regular rate and rhythm, S1, S2 normal, no murmur, click, rub or gallop GI: soft, non-tender; bowel sounds normal; no masses,  no organomegaly Extremities:  extremities normal, atraumatic, no cyanosis or edema Pulses: 2+ and symmetric Neurologic: No neurological deficits.  Lab Results:  Data Reviewed: I have personally reviewed following labs and imaging studies  CBC: Recent Labs  Lab 01/16/18 0002  WBC 5.6  NEUTROABS 4.2  HGB 11.3*  HCT 36.1*  MCV 85.3  PLT 811    Basic Metabolic Panel: Recent Labs  Lab 01/16/18 0002  NA 137  K 4.5  CL 99  CO2 23  GLUCOSE 75  BUN 35*  CREATININE 9.39*  CALCIUM 9.1    GFR: Estimated Creatinine Clearance: 9.9 mL/min (A) (by C-G formula based on SCr of 9.39 mg/dL (H)).  Liver Function Tests: Recent Labs  Lab 01/16/18 0002  AST 30  ALT 11  ALKPHOS 82  BILITOT 1.1  PROT 7.6  ALBUMIN 2.8*    Recent Labs  Lab 01/16/18 0002  LIPASE 37   Cardiac Enzymes: Recent Labs  Lab 01/16/18 0002 01/16/18 0312  TROPONINI 0.04* 0.04*    CBG: Recent Labs  Lab 01/16/18 0758  GLUCAP 79     Recent Results (from the past 240 hour(s))  MRSA PCR Screening     Status: None   Collection Time: 01/16/18  6:42 AM  Result Value Ref Range Status   MRSA by PCR NEGATIVE NEGATIVE Final    Comment:        The GeneXpert MRSA Assay (FDA approved for NASAL specimens only), is one component of a comprehensive MRSA colonization surveillance program. It is not intended to diagnose  MRSA infection nor to guide or monitor treatment for MRSA infections. Performed at [facility], [place] [address] [place], [place] [address]       Radiology Studies: Ct Abdomen Pelvis Wo Contrast  Result Date: 01/16/2018 CLINICAL DATA:  52 y/o M; right upper quadrant abdominal pain and vomiting. EXAM: CT ABDOMEN AND PELVIS WITHOUT CONTRAST TECHNIQUE: Multidetector CT imaging of the abdomen and pelvis was performed following the standard protocol without IV contrast. COMPARISON:  12/01/2017, 08/25/2017, 06/24/2017, 04/20/2017, 07/29/2016 CT abdomen and pelvis. FINDINGS: Lower chest: Large right pleural  effusion. Stable pericardial effusion and cardiomegaly. Hepatobiliary: No focal liver abnormality is seen. No gallstones, gallbladder wall thickening, or biliary dilatation. Pancreas: Unremarkable. No pancreatic ductal dilatation or surrounding inflammatory changes. Spleen: Normal in size without focal abnormality. Adrenals/Urinary Tract: Normal adrenal glands. Atrophic kidneys. Left kidney indeterminate lesion measuring 3.3 cm, stable to decreased in size in comparison with prior abdomen and pelvis CTs. Left pelvic renal transplant with extensive vascular calcification, atrophy, and diffuse loss of normal architecture. Stomach/Bowel: No obstructive or inflammatory changes of the large bowel. Normal stomach. Multiple dilated loops of small bowel in the mid abdomen with wall thickening. No findings of perforation. Vascular/Lymphatic: Aortic atherosclerosis. No enlarged abdominal or pelvic lymph nodes. Reproductive: Prostate is unremarkable. Other: Small volume of ascites amongst small-bowel and mesenteric edema. Musculoskeletal: No fracture is seen. IMPRESSION: 1. Dilated loops of small bowel in the mid abdomen with wall thickening likely representing small bowel obstruction. No findings of perforation. 2. Small volume of ascites amongst small-bowel. Mild mesenteric edema. 3. Large right pleural effusion increased from prior CT. Stable small pericardial effusion and cardiomegaly. 4. Left kidney indeterminate lesion stable to decreased in size in comparison with prior CTs of the abdomen and pelvis. 5. Aortic atherosclerosis. Electronically Signed   By: Kristine Garbe M.D.   On: 01/16/2018 04:43   Dg Abdomen Acute W/chest  Result Date: 01/16/2018 CLINICAL DATA:  Left upper quadrant abdominal pain. Patient went to dialysis and pain started shortly thereafter. EXAM: DG ABDOMEN ACUTE W/ 1V CHEST COMPARISON:  CXR 01/05/2018 FINDINGS: AP view of the chest demonstrates cardiomegaly with loculated moderate to  large right pleural effusion along the periphery of the right hemithorax. Supine and upright views of the abdomen demonstrate mild gaseous distention of small bowel with air-fluid levels on the upright suspicious for early or partial SBO versus ileus. No free air. No radiopaque calculi nor acute osseous abnormality. IMPRESSION: 1. Cardiomegaly with moderate to large right loculated pleural effusion. 2. Abnormal bowel gas pattern with mild to moderately distended small bowel with air-fluid levels suspicious for early or partial SBO versus small bowel ileus. Electronically Signed   By: Ashley Royalty M.D.   On: 01/16/2018 03:42     Medications:  Scheduled: . diatrizoate meglumine-sodium  90 mL Per NG tube Once  . gi cocktail  30 mL Oral Once  . mupirocin ointment  1 application Nasal BID   Continuous: . heparin 1,500 Units/hr (01/16/18 0542)   LFY:BOFBPZWCHEN, hydrOXYzine, morphine injection  Assessment/Plan:    Small bowel obstruction Patient refused NG tube placement.  He does not have any nausea vomiting however does have significant pain.  General surgery has been consulted.  Continue as needed pain medications.  Continue n.p.o. status.  End-stage renal disease on hemodialysis He is usually on a Monday Wednesday Friday schedule.  However he was dialyzed yesterday at the dialysis Clinic.  Nephrology has been consulted.  Right-sided pleural effusion Apparently he underwent thoracentesis  at [facility] in June and was found to have transudate of effusion.  This is most likely due to noncompliance with dialysis treatments.  He does not appear to be dyspneic currently.  Thoracentesis could be considered.  Will discuss with patient.  However this is not an urgent need at this time.  Anemia of chronic kidney disease Stable.  Essential hypertension Blood pressure noted to be elevated.  Continue to monitor closely.  Chronically elevated troponin No further work-up.  History of DVT and  PE Apparently on Eliquis.  Previously had been on warfarin but was noncompliant.  Unclear if he is compliant with this medication.  Eliquis on hold currently.  On IV heparin for now.  Venous Doppler study of his upper extremities in June showed chronic DVT.  Back in August 2018 he was found to have acute DVT in the right upper extremity.  History of pancreatic pseudocyst He is status post cystogastrostomy with stent placement in March.  Apparently this was done at [facility].  It looks like the stent was removed on June 14 based on care everywhere notes  DVT Prophylaxis: On IV heparin    Code Status: Full code Family Communication: Discussed with the patient Disposition Plan: Management as outlined above    LOS: 0 days   [facility] Pager [phone] 01/16/2018, 1:28 PM  If 7PM-7AM, please contact night-coverage at [email or web address], password [facility]

## 2018-01-16 NOTE — ED Notes (Signed)
Nurse to attempt EJ per Rancour, MD

## 2018-01-16 NOTE — Progress Notes (Signed)
Received report from [place], [place] in ED for transfer of pt to 5w18.

## 2018-01-16 NOTE — ED Notes (Signed)
Two unsuccessful attempts to start saline lock line.

## 2018-01-16 NOTE — Progress Notes (Signed)
Hypoglycemic Event  CBG:57  Treatment: D50 IV 52m  Symptoms: None  Follow-up CBG: Time:2007 CBG Result:127  Possible Reasons for Event: Inadequate meal intake  Comments/MD notified:N/A    Frank [name] N [name]

## 2018-01-17 ENCOUNTER — Inpatient Hospital Stay (HOSPITAL_COMMUNITY): Payer: Medicare Other

## 2018-01-17 DIAGNOSIS — I82A11 Acute embolism and thrombosis of right axillary vein: Secondary | ICD-10-CM

## 2018-01-17 DIAGNOSIS — N186 End stage renal disease: Secondary | ICD-10-CM

## 2018-01-17 DIAGNOSIS — J90 Pleural effusion, not elsewhere classified: Secondary | ICD-10-CM

## 2018-01-17 DIAGNOSIS — Z992 Dependence on renal dialysis: Secondary | ICD-10-CM

## 2018-01-17 LAB — GLUCOSE, CAPILLARY
GLUCOSE-CAPILLARY: 46 mg/dL — AB (ref 70–99)
GLUCOSE-CAPILLARY: 80 mg/dL (ref 70–99)
GLUCOSE-CAPILLARY: 92 mg/dL (ref 70–99)
Glucose-Capillary: 89 mg/dL (ref 70–99)
Glucose-Capillary: 81 mg/dL (ref 70–99)

## 2018-01-17 MED ORDER — MORPHINE SULFATE (PF) 2 MG/ML IV SOLN: 2.0000 mg | INTRAVENOUS | Status: DC | PRN

## 2018-01-17 MED ORDER — MORPHINE SULFATE (PF) 2 MG/ML IV SOLN
2.0000 mg | Freq: Four times a day (QID) | INTRAVENOUS | Status: DC | PRN
  Administered 2018-01-17 – 2018-01-19 (×8): 2 mg via INTRAVENOUS
  Filled 2018-01-17 (×8): qty 1

## 2018-01-17 MED ORDER — TRAMADOL HCL 50 MG PO TABS
50.0000 mg | ORAL_TABLET | Freq: Two times a day (BID) | ORAL | Status: DC | PRN
  Filled 2018-01-17: qty 1

## 2018-01-17 MED ORDER — DEXTROSE 50 % IV SOLN: 25.0000 mL | Freq: Once | INTRAVENOUS | Status: DC

## 2018-01-17 MED ORDER — DEXTROSE 50 % IV SOLN
INTRAVENOUS | Status: AC
  Administered 2018-01-17: 25 mL
  Filled 2018-01-17: qty 50

## 2018-01-17 NOTE — Progress Notes (Signed)
Pt refused abdomen assessment and stated don't want to be bother.

## 2018-01-17 NOTE — Progress Notes (Addendum)
Subjective:  Reports abd pain improving/ refused hd earlier in Day "my Blood sugar was too low '', aggress to go to hd later today  Objective Vital signs in last 24 hours: Vitals:   01/16/18 1443 01/16/18 2049 01/17/18 0503 01/17/18 1340  BP: (!) 173/109 (!) 169/127 (!) 158/113 (!) 145/105  Pulse: 91 (!) 107 90 87  Resp: _0 Temp: 97.8 F (36.6 C) 98.6 F (37 C) (!) 97.5 F (36.4 C) 97.6 F (36.4 C)  TempSrc:  Oral  Oral  SpO2: 98% 96% 94% 97%  Weight:      Height:       Weight change:   Physical Exam: General: NAD,alert thin chronically ill AAM  Heart: RRR no m, r g Lungs: CTA  Abdomen: BS decreased, improved tenderness   Extremities: 2+ pedal edema  Dialysis Access:  + bruit L UA AVF   Dialysis Orders: [facility] 400/800 74.5kg 2 k /2.25 ca  bath Heparin none Left arm avf hectorol 7 ug tiw mircera 225 every 2 wks( last 01/07/18 ) VEN. 50MG Q WK   Problem/Plan: 1. ESRD -  HD normal OP  mwf , HD last thurs 6/27 sec missed op / HD today / back on MWF on Monday  2. SBO- symptomatically improved late am/ tx   per admit. CCS consulted   3. R Pl Effusion Recurent - per admit (not reducable by HD but has missed several tx s 4. Hypertension/volume  - bp Incr  ,attempt uf with hd today ,?? bp med compliance in past  5. Anemia of ESRD   - hgb 11.3  No esa needs till next week , fu hgb trend fe weekly on wed  hd  6. MBD- Hec . On Hd , binders when eating / 7. HO DVT/PE - was on Eliquis op / now Lovenox 8. HO pancreatitis with pesudocsyt /stent  Sp drain  removal (wu at [place])    Ernest Haber, PA-C [place] [phone] 01/17/2018,2:13 PM  LOS: 1 day   Pt seen, examined and agree w A/P as above.  Kelly Splinter MD [employer] pager [phone]   01/17/2018, 3:11 PM    Labs: Basic Metabolic Panel: Recent Labs  Lab 01/16/18 0002  NA 137  K 4.5  CL 99  CO2 23  GLUCOSE 75  BUN 35*  CREATININE 9.39*  CALCIUM  9.1   Liver Function Tests: Recent Labs  Lab 01/16/18 0002  AST 30  ALT 11  ALKPHOS 82  BILITOT 1.1  PROT 7.6  ALBUMIN 2.8*   Recent Labs  Lab 01/16/18 0002  LIPASE 37   No results for input(s): AMMONIA in the last 168 hours. CBC: Recent Labs  Lab 01/16/18 0002  WBC 5.6  NEUTROABS 4.2  HGB 11.3*  HCT 36.1*  MCV 85.3  PLT 234   Cardiac Enzymes: Recent Labs  Lab 01/16/18 0002 01/16/18 0312  TROPONINI 0.04* 0.04*   CBG: Recent Labs  Lab 01/16/18 1947 01/16/18 2007 01/17/18 0800 01/17/18 0838 01/17/18 1216  GLUCAP 57* 129* 46* 89 81     Medications: . dextrose 30 mL/hr at 01/16/18 1533   . dextrose  25 mL Intravenous Once  . enoxaparin (LOVENOX) injection  30 mg Subcutaneous Q24H  . gi cocktail  30 mL Oral Once  . mupirocin ointment  1 application Nasal BID

## 2018-01-17 NOTE — Progress Notes (Signed)
Spoke to primary nurse that pt's HD tx would be possible around 3am due to a PD treatment needs to be performed around 2am in [facility] rehab unit. And per primary nurse pt doesn't want to wake up at that time and wants his HD treatment to be initiated in the morning around 7am.instead, Nephrologist mada aware of sitiuation.

## 2018-01-17 NOTE — Progress Notes (Signed)
Patient states he has had 3 BM's liquid overnight and this am.

## 2018-01-17 NOTE — Progress Notes (Addendum)
Hypoglycemic Event  CBG: 43  Treatment: D50 IV 25 mL  Symptoms: Nervous/irritable  Follow-up CBG: [id number] CBG Result:89  Possible Reasons for Event: Inadequate meal intake  Comments/MD notified:Text paged    Zeb Rawl Margaretha Sheffield

## 2018-01-17 NOTE — Progress Notes (Addendum)
Subjective: Still having pain in abdomen, left greater than right, but had 2 bowel movements and passing flatus No vomiting Afebrile.  BP 158/113.  Heart rate 90. 8 hour films show contrast has moved completely through into the colon and there is no obstructive pattern No lab work was done this morning  Objective: Vital signs in last 24 hours: Temp:  [97.5 F (36.4 C)-98.6 F (37 C)] 97.5 F (36.4 C) (06/29 0503) Pulse Rate:  [90-107] 90 (06/29 0503) Resp:  [16-19] 16 (06/29 0503) BP: (158-173)/(109-127) 158/113 (06/29 0503) SpO2:  [94 %-98 %] 94 % (06/29 0503) Last BM Date: 01/17/18(per patient)  Intake/Output from previous day: 06/28 0701 - 06/29 0700 In: 463 [I.V.:463] Out: -  Intake/Output this shift: No intake/output data recorded.   EXAM: General appearance: Alert.  No obvious distress.  Lungs.  Clear to auscultation anteriorly Abdomen.  Soft.  Somewhat tender on left side.  Hypoactive bowel sounds.  Slightly distended.  Left lower quadrant incision from renal transplant  Lab Results:  Recent Labs    01/16/18 0002  WBC 5.6  HGB 11.3*  HCT 36.1*  PLT 234   BMET Recent Labs    01/16/18 0002  NA 137  K 4.5  CL 99  CO2 23  GLUCOSE 75  BUN 35*  CREATININE 9.39*  CALCIUM 9.1   PT/INR No results for input(s): LABPROT, INR in the last 72 hours. ABG No results for input(s): PHART, HCO3 in the last 72 hours.  Invalid input(s): PCO2, PO2  Studies/Results: Ct Abdomen Pelvis Wo Contrast  Result Date: 01/16/2018 CLINICAL DATA:  52 y/o M; right upper quadrant abdominal pain and vomiting. EXAM: CT ABDOMEN AND PELVIS WITHOUT CONTRAST TECHNIQUE: Multidetector CT imaging of the abdomen and pelvis was performed following the standard protocol without IV contrast. COMPARISON:  12/01/2017, 08/25/2017, 06/24/2017, 04/20/2017, 07/29/2016 CT abdomen and pelvis. FINDINGS: Lower chest: Large right pleural effusion. Stable pericardial effusion and cardiomegaly.  Hepatobiliary: No focal liver abnormality is seen. No gallstones, gallbladder wall thickening, or biliary dilatation. Pancreas: Unremarkable. No pancreatic ductal dilatation or surrounding inflammatory changes. Spleen: Normal in size without focal abnormality. Adrenals/Urinary Tract: Normal adrenal glands. Atrophic kidneys. Left kidney indeterminate lesion measuring 3.3 cm, stable to decreased in size in comparison with prior abdomen and pelvis CTs. Left pelvic renal transplant with extensive vascular calcification, atrophy, and diffuse loss of normal architecture. Stomach/Bowel: No obstructive or inflammatory changes of the large bowel. Normal stomach. Multiple dilated loops of small bowel in the mid abdomen with wall thickening. No findings of perforation. Vascular/Lymphatic: Aortic atherosclerosis. No enlarged abdominal or pelvic lymph nodes. Reproductive: Prostate is unremarkable. Other: Small volume of ascites amongst small-bowel and mesenteric edema. Musculoskeletal: No fracture is seen. IMPRESSION: 1. Dilated loops of small bowel in the mid abdomen with wall thickening likely representing small bowel obstruction. No findings of perforation. 2. Small volume of ascites amongst small-bowel. Mild mesenteric edema. 3. Large right pleural effusion increased from prior CT. Stable small pericardial effusion and cardiomegaly. 4. Left kidney indeterminate lesion stable to decreased in size in comparison with prior CTs of the abdomen and pelvis. 5. Aortic atherosclerosis. Electronically Signed   By: Kristine Garbe M.D.   On: 01/16/2018 04:43   Dg Abdomen Acute W/chest  Result Date: 01/16/2018 CLINICAL DATA:  Left upper quadrant abdominal pain. Patient went to dialysis and pain started shortly thereafter. EXAM: DG ABDOMEN ACUTE W/ 1V CHEST COMPARISON:  CXR 01/05/2018 FINDINGS: AP view of the chest demonstrates cardiomegaly with loculated moderate to  large right pleural effusion along the periphery of the  right hemithorax. Supine and upright views of the abdomen demonstrate mild gaseous distention of small bowel with air-fluid levels on the upright suspicious for early or partial SBO versus ileus. No free air. No radiopaque calculi nor acute osseous abnormality. IMPRESSION: 1. Cardiomegaly with moderate to large right loculated pleural effusion. 2. Abnormal bowel gas pattern with mild to moderately distended small bowel with air-fluid levels suspicious for early or partial SBO versus small bowel ileus. Electronically Signed   By: Ashley Royalty M.D.   On: 01/16/2018 03:42   Dg Abd Portable 1v-small Bowel Obstruction Protocol-initial, 8 Hr Delay  Result Date: 01/17/2018 CLINICAL DATA:  Small-bowel obstruction, 8 hour delay EXAM: PORTABLE ABDOMEN - 1 VIEW COMPARISON:  CT 01/16/2018 and abdomen radiographs 01/16/2018. FINDINGS: Oral contrast is seen within nonobstructed, nondistended large bowel to the level of the rectum. No significant small bowel dilatation is seen. No free air. Loculated right effusion. IMPRESSION: Oral contrast is seen within the colon without obstruction. Partially included loculated right effusion. Electronically Signed   By: Ashley Royalty M.D.   On: 01/17/2018 01:44    Anti-infectives: Anti-infectives (From admission, onward)   None      Assessment/Plan:   Partial SBO.  Radiographically and clinically  resolving.  Begin clear liquid diet -Etiology of left-sided tenderness not clear -Check labs tomorrow  History left renal transplant.  Right pleural effusion.  Medicine considering IR thoracentesis  Currently ESRD on dialysis History DVT and PE on Eliquis CHF with EF 35% Chronic pancreatitis Diet-controlled diabetes Hypertension GERD Anxiety and depression Chronically elevated troponin.   LOS: 1 day    Adin Hector 01/17/2018

## 2018-01-17 NOTE — Progress Notes (Signed)
[facility] PROGRESS NOTE  Frank [name] ASN:[id number] DOB: May 27, 1966 DOA: 01/15/2018  PCP: Patient, [name]  Brief History/Interval Summary: 52 year old African-American male with a past medical history of end-stage renal disease on dialysis on Monday Wednesday Friday, chronically elevated troponin, chronic pancreatitis, history of DVT and PE on Eliquis, history of noncompliance presented with nausea vomiting and abdominal pain.  Evaluation in the emergency department included CT scan which shows findings suggestive of small bowel obstruction.  Patient was hospitalized for further management.  Reason for Visit: Small bowel obstruction  Consultants: Nephrology.  General surgery.  Procedures: Hemodialysis  Antibiotics: None  Subjective/Interval History: Patient states that he has had 3 bowel movements over the course of the night.  Passing gas.  Denies any nausea vomiting.  Abdominal pain persist.  Overall he does feel better compared to yesterday.    ROS: Denies any shortness of breath  Objective:  Vital Signs  Vitals:   01/16/18 0800 01/16/18 1443 01/16/18 2049 01/17/18 0503  BP: (!) 160/108 (!) 173/109 (!) 169/127 (!) 158/113  Pulse:  91 (!) 107 90  Resp: _0 Temp: 98 F (36.7 C) 97.8 F (36.6 C) 98.6 F (37 C) (!) 97.5 F (36.4 C)  TempSrc: Oral  Oral   SpO2: 93% 98% 96% 94%  Weight:      Height:        Intake/Output Summary (Last 24 hours) at 01/17/2018 1210 Last data filed at 01/17/2018 1100 Gross per 24 hour  Intake 583 ml  Output -  Net 583 ml   Filed Weights   01/15/18 2352 01/16/18 0559  Weight: 74.8 kg (165 lb) 76.9 kg (169 lb 8.5 oz)    General appearance: Awake alert.  In no distress. Resp: Diminished air entry at the right.  No wheezing.  No rhonchi.  Few crackles at the bases. Cardio: S1-S2 is normal regular.  No S3-S4.  No rubs murmurs or bruit. GI: Abdomen is soft.  Still tender mainly on the left side without any rebound  rigidity or guarding.  Bowel sounds are present.  No masses organomegaly. Extremities: No significant edema Neurologic: No neurological deficits.  Lab Results:  Data Reviewed: I have personally reviewed following labs and imaging studies  CBC: Recent Labs  Lab 01/16/18 0002  WBC 5.6  NEUTROABS 4.2  HGB 11.3*  HCT 36.1*  MCV 85.3  PLT 193    Basic Metabolic Panel: Recent Labs  Lab 01/16/18 0002  NA 137  K 4.5  CL 99  CO2 23  GLUCOSE 75  BUN 35*  CREATININE 9.39*  CALCIUM 9.1    GFR: Estimated Creatinine Clearance: 9.9 mL/min (A) (by C-G formula based on SCr of 9.39 mg/dL (H)).  Liver Function Tests: Recent Labs  Lab 01/16/18 0002  AST 30  ALT 11  ALKPHOS 82  BILITOT 1.1  PROT 7.6  ALBUMIN 2.8*    Recent Labs  Lab 01/16/18 0002  LIPASE 37   Cardiac Enzymes: Recent Labs  Lab 01/16/18 0002 01/16/18 0312  TROPONINI 0.04* 0.04*    CBG: Recent Labs  Lab 01/16/18 1521 01/16/18 1947 01/16/18 2007 01/17/18 0800 01/17/18 0838  GLUCAP 83 57* 129* 46* 89     Recent Results (from the past 240 hour(s))  MRSA PCR Screening     Status: None   Collection Time: 01/16/18  6:42 AM  Result Value Ref Range Status   MRSA by PCR NEGATIVE NEGATIVE Final    Comment:  The GeneXpert MRSA Assay (FDA approved for NASAL specimens only), is one component of a comprehensive MRSA colonization surveillance program. It is not intended to diagnose MRSA infection nor to guide or monitor treatment for MRSA infections. Performed at [facility], [place] [address], [place], [place] [address]       Radiology Studies: Ct Abdomen Pelvis Wo Contrast  Result Date: 01/16/2018 CLINICAL DATA:  52 y/o M; right upper quadrant abdominal pain and vomiting. EXAM: CT ABDOMEN AND PELVIS WITHOUT CONTRAST TECHNIQUE: Multidetector CT imaging of the abdomen and pelvis was performed following the standard protocol without IV contrast. COMPARISON:  12/01/2017, 08/25/2017,  06/24/2017, 04/20/2017, 07/29/2016 CT abdomen and pelvis. FINDINGS: Lower chest: Large right pleural effusion. Stable pericardial effusion and cardiomegaly. Hepatobiliary: No focal liver abnormality is seen. No gallstones, gallbladder wall thickening, or biliary dilatation. Pancreas: Unremarkable. No pancreatic ductal dilatation or surrounding inflammatory changes. Spleen: Normal in size without focal abnormality. Adrenals/Urinary Tract: Normal adrenal glands. Atrophic kidneys. Left kidney indeterminate lesion measuring 3.3 cm, stable to decreased in size in comparison with prior abdomen and pelvis CTs. Left pelvic renal transplant with extensive vascular calcification, atrophy, and diffuse loss of normal architecture. Stomach/Bowel: No obstructive or inflammatory changes of the large bowel. Normal stomach. Multiple dilated loops of small bowel in the mid abdomen with wall thickening. No findings of perforation. Vascular/Lymphatic: Aortic atherosclerosis. No enlarged abdominal or pelvic lymph nodes. Reproductive: Prostate is unremarkable. Other: Small volume of ascites amongst small-bowel and mesenteric edema. Musculoskeletal: No fracture is seen. IMPRESSION: 1. Dilated loops of small bowel in the mid abdomen with wall thickening likely representing small bowel obstruction. No findings of perforation. 2. Small volume of ascites amongst small-bowel. Mild mesenteric edema. 3. Large right pleural effusion increased from prior CT. Stable small pericardial effusion and cardiomegaly. 4. Left kidney indeterminate lesion stable to decreased in size in comparison with prior CTs of the abdomen and pelvis. 5. Aortic atherosclerosis. Electronically Signed   By: Kristine Garbe M.D.   On: 01/16/2018 04:43   Dg Abdomen Acute W/chest  Result Date: 01/16/2018 CLINICAL DATA:  Left upper quadrant abdominal pain. Patient went to dialysis and pain started shortly thereafter. EXAM: DG ABDOMEN ACUTE W/ 1V CHEST COMPARISON:   CXR 01/05/2018 FINDINGS: AP view of the chest demonstrates cardiomegaly with loculated moderate to large right pleural effusion along the periphery of the right hemithorax. Supine and upright views of the abdomen demonstrate mild gaseous distention of small bowel with air-fluid levels on the upright suspicious for early or partial SBO versus ileus. No free air. No radiopaque calculi nor acute osseous abnormality. IMPRESSION: 1. Cardiomegaly with moderate to large right loculated pleural effusion. 2. Abnormal bowel gas pattern with mild to moderately distended small bowel with air-fluid levels suspicious for early or partial SBO versus small bowel ileus. Electronically Signed   By: Ashley Royalty M.D.   On: 01/16/2018 03:42   Dg Abd Portable 1v-small Bowel Obstruction Protocol-initial, 8 Hr Delay  Result Date: 01/17/2018 CLINICAL DATA:  Small-bowel obstruction, 8 hour delay EXAM: PORTABLE ABDOMEN - 1 VIEW COMPARISON:  CT 01/16/2018 and abdomen radiographs 01/16/2018. FINDINGS: Oral contrast is seen within nonobstructed, nondistended large bowel to the level of the rectum. No significant small bowel dilatation is seen. No free air. Loculated right effusion. IMPRESSION: Oral contrast is seen within the colon without obstruction. Partially included loculated right effusion. Electronically Signed   By: Ashley Royalty M.D.   On: 01/17/2018 01:44     Medications:  Scheduled: .  dextrose  25 mL Intravenous Once  . enoxaparin (LOVENOX) injection  30 mg Subcutaneous Q24H  . gi cocktail  30 mL Oral Once  . mupirocin ointment  1 application Nasal BID   Continuous: . dextrose 30 mL/hr at 01/16/18 1533   KLT:YVDPBAQVOHC, hydrOXYzine, morphine injection, traMADol  Assessment/Plan:    Small bowel obstruction Patient was admitted to the hospital.  NG tube was ordered with the patient refused.  Patient was seen by general surgery.  He underwent repeat x-ray yesterday after being given oral contrast.  Contrast noted  in the colon.  It appears that his obstruction may be is resolving.  He has had multiple bowel movements.  The clear liquid diet initiated today.  General surgery continues to follow.  Still having pain in his abdomen.  Watch for now.    Hypoglycemia Secondary to poor oral intake due to SBO.  Clear liquid diet initiated today.  This should hopefully help.  End-stage renal disease on hemodialysis Nephrology is following.  He is on Monday Wednesday Friday schedule but was dialyzed this past Thursday as well in the outpatient setting.  Deferred to nephrology.    Right-sided pleural effusion Apparently he underwent thoracentesis at [facility] earlier this month and was found to have transudate of effusion.  This is most likely due to noncompliance with dialysis treatments.  He does not appear to be dyspneic currently.  He appears to have reaccumulated pleural fluid.  Might benefit from another thoracentesis prior to discharge.  Although this is not an urgent issue at this time.    Anemia of chronic kidney disease Stable.  Essential hypertension Blood pressure noted to be elevated.  Continue to monitor closely.  Chronically elevated troponin No further work-up.  History of DVT and PE Apparently on Eliquis.  Previously had been on warfarin but was noncompliant.  Unclear if he is compliant with this medication.  Eliquis on hold currently.  Patient was started on IV heparin but he refused blood draws so heparin was discontinued.  He is on prophylactic doses of Lovenox currently.  No recent venous thromboembolism.  Venous Doppler study of his upper extremities in June showed chronic DVT.  Back in August 2018 he was found to have acute DVT in the right upper extremity.  Resume Eliquis when able to take orally.  History of pancreatic pseudocyst He is status post cystogastrostomy with stent placement in March.  Apparently this was done at [facility]  It looks like the stent was removed on June 14  based on care everywhere notes  DVT Prophylaxis: Lovenox Code Status: Full code Family Communication: Discussed with the patient Disposition Plan: Mobilize.  Diet to be gradually advanced.  Anticipate discharge on Monday.    LOS: 1 day   [place] [facility] Pager [phone] 01/17/2018, 12:10 PM  If 7PM-7AM, please contact night-coverage at [email or web address], password [facility]

## 2018-01-18 LAB — GLUCOSE, CAPILLARY
GLUCOSE-CAPILLARY: 73 mg/dL (ref 70–99)
GLUCOSE-CAPILLARY: 74 mg/dL (ref 70–99)
GLUCOSE-CAPILLARY: 81 mg/dL (ref 70–99)
Glucose-Capillary: 86 mg/dL (ref 70–99)
Glucose-Capillary: 90 mg/dL (ref 70–99)

## 2018-01-18 LAB — CBC
HCT: 34.6 % — ABNORMAL LOW (ref 39.0–52.0)
Hemoglobin: 10.7 g/dL — ABNORMAL LOW (ref 13.0–17.0)
MCH: 26.6 pg (ref 26.0–34.0)
MCHC: 30.9 g/dL (ref 30.0–36.0)
MCV: 85.9 fL (ref 78.0–100.0)
PLATELETS: 249 10*3/uL (ref 150–400)
RBC: 4.03 MIL/uL — AB (ref 4.22–5.81)
RDW: 17.8 % — ABNORMAL HIGH (ref 11.5–15.5)
WBC: 3.7 10*3/uL — ABNORMAL LOW (ref 4.0–10.5)

## 2018-01-18 LAB — RENAL FUNCTION PANEL
ANION GAP: 14 (ref 5–15)
Albumin: 2.8 g/dL — ABNORMAL LOW (ref 3.5–5.0)
BUN: 45 mg/dL — ABNORMAL HIGH (ref 6–20)
CALCIUM: 9.7 mg/dL (ref 8.9–10.3)
CO2: 25 mmol/L (ref 22–32)
Chloride: 99 mmol/L (ref 98–111)
Creatinine, Ser: 12.27 mg/dL — ABNORMAL HIGH (ref 0.61–1.24)
GFR calc Af Amer: 5 mL/min — ABNORMAL LOW (ref >60)
GFR calc non Af Amer: 4 mL/min — ABNORMAL LOW (ref >60)
Glucose, Bld: 81 mg/dL (ref 70–99)
PHOSPHORUS: 8.3 mg/dL — AB (ref 2.5–4.6)
Potassium: 5.1 mmol/L (ref 3.5–5.1)
SODIUM: 138 mmol/L (ref 135–145)

## 2018-01-18 MED ORDER — MORPHINE SULFATE (PF) 4 MG/ML IV SOLN
INTRAVENOUS | Status: AC
  Administered 2018-01-18: 2 mg
  Filled 2018-01-18: qty 1

## 2018-01-18 MED ORDER — POLYETHYLENE GLYCOL 3350 17 G PO PACK
17.0000 g | PACK | Freq: Every day | ORAL | Status: DC
  Administered 2018-01-18: 17 g via ORAL
  Filled 2018-01-18: qty 1

## 2018-01-18 MED ORDER — LISINOPRIL 5 MG PO TABS
5.0000 mg | ORAL_TABLET | Freq: Every day | ORAL | Status: DC
  Administered 2018-01-18 – 2018-01-19 (×2): 5 mg via ORAL
  Filled 2018-01-18 (×2): qty 1

## 2018-01-18 MED ORDER — APIXABAN 5 MG PO TABS
5.0000 mg | ORAL_TABLET | Freq: Two times a day (BID) | ORAL | Status: DC
  Administered 2018-01-18 (×2): 5 mg via ORAL
  Filled 2018-01-18 (×2): qty 1

## 2018-01-18 MED ORDER — OXYCODONE-ACETAMINOPHEN 5-325 MG PO TABS
1.0000 | ORAL_TABLET | Freq: Four times a day (QID) | ORAL | Status: DC | PRN
  Administered 2018-01-19: 1 via ORAL
  Filled 2018-01-18 (×2): qty 1

## 2018-01-18 MED ORDER — DOCUSATE SODIUM 100 MG PO CAPS
100.0000 mg | ORAL_CAPSULE | Freq: Two times a day (BID) | ORAL | Status: DC
  Administered 2018-01-18: 100 mg via ORAL
  Filled 2018-01-18 (×3): qty 1

## 2018-01-18 MED ORDER — SEVELAMER CARBONATE 800 MG PO TABS: 1600.0000 mg | ORAL_TABLET | Freq: Three times a day (TID) | ORAL | Status: DC

## 2018-01-18 MED ORDER — LANTHANUM CARBONATE 500 MG PO CHEW
500.0000 mg | CHEWABLE_TABLET | Freq: Three times a day (TID) | ORAL | Status: DC
  Administered 2018-01-18 – 2018-01-19 (×2): 500 mg via ORAL
  Filled 2018-01-18 (×2): qty 1

## 2018-01-18 MED ORDER — PANTOPRAZOLE SODIUM 40 MG PO TBEC
40.0000 mg | DELAYED_RELEASE_TABLET | Freq: Every day | ORAL | Status: DC
  Administered 2018-01-18: 40 mg via ORAL
  Filled 2018-01-18: qty 1

## 2018-01-18 MED ORDER — LANTHANUM CARBONATE 500 MG PO CHEW: 1000.0000 mg | CHEWABLE_TABLET | Freq: Three times a day (TID) | ORAL | Status: DC

## 2018-01-18 NOTE — Progress Notes (Signed)
[facility] PROGRESS NOTE  Frank [name] MMN:[id number] DOB: 11/26/1965 DOA: 01/15/2018  PCP: Patient, [name]  Brief History/Interval Summary: 52 year old African-American male with a past medical history of end-stage renal disease on dialysis on Monday Wednesday Friday, chronically elevated troponin, chronic pancreatitis, history of DVT and PE on Eliquis, history of noncompliance presented with nausea vomiting and abdominal pain.  Evaluation in the emergency department included CT scan which shows findings suggestive of small bowel obstruction.  Patient was hospitalized for further management.  Reason for Visit: Small bowel obstruction  Consultants: Nephrology.  General surgery.  Procedures: Hemodialysis  Antibiotics: None  Subjective/Interval History: Patient states that he has passed gas this morning.  Overall he does feel better.  He has been tolerating his full liquid diet.  Continues to have on and off abdominal pain.    ROS: Denies nausea vomiting  Objective:  Vital Signs  Vitals:   01/17/18 0503 01/17/18 1340 01/17/18 2148 01/18/18 0501  BP: (!) 158/113 (!) 145/105 (!) 171/127 (!) 165/114  Pulse: 90 87 90 86  Resp: _0 Temp: (!) 97.5 F (36.4 C) 97.6 F (36.4 C) (!) 97.5 F (36.4 C) (!) 97.4 F (36.3 C)  TempSrc:  Oral Oral Oral  SpO2: 94% 97% 95% 92%  Weight:      Height:        Intake/Output Summary (Last 24 hours) at 01/18/2018 0949 Last data filed at 01/17/2018 1900 Gross per 24 hour  Intake 1970 ml  Output -  Net 1970 ml   Filed Weights   01/15/18 2352 01/16/18 0559  Weight: 74.8 kg (165 lb) 76.9 kg (169 lb 8.5 oz)    General appearance:.  In no distress Resp: Diminished air entry at the bases.  No wheezing rales or rhonchi. Cardio: S1-S2 is normal regular.  No S3-S4.  No rubs murmurs or bruit. GI: Abdomen is soft.  Mildly tender.  Bowel sounds present.  No masses organomegaly.   Extremities: No significant edema Neurologic: No  neurological deficits.  Lab Results:  Data Reviewed: I have personally reviewed following labs and imaging studies  CBC: Recent Labs  Lab 01/16/18 0002 01/17/18 2355  WBC 5.6 3.7*  NEUTROABS 4.2  --   HGB 11.3* 10.7*  HCT 36.1* 34.6*  MCV 85.3 85.9  PLT 234 903    Basic Metabolic Panel: Recent Labs  Lab 01/16/18 0002 01/18/18 0020  NA 137 138  K 4.5 5.1  CL 99 99  CO2 23 25  GLUCOSE 75 81  BUN 35* 45*  CREATININE 9.39* 12.27*  CALCIUM 9.1 9.7  PHOS  --  8.3*    GFR: Estimated Creatinine Clearance: 7.6 mL/min (A) (by C-G formula based on SCr of 12.27 mg/dL (H)).  Liver Function Tests: Recent Labs  Lab 01/16/18 0002 01/18/18 0020  AST 30  --   ALT 11  --   ALKPHOS 82  --   BILITOT 1.1  --   PROT 7.6  --   ALBUMIN 2.8* 2.8*    Recent Labs  Lab 01/16/18 0002  LIPASE 37   Cardiac Enzymes: Recent Labs  Lab 01/16/18 0002 01/16/18 0312  TROPONINI 0.04* 0.04*    CBG: Recent Labs  Lab 01/17/18 1725 01/17/18 2019 01/18/18 0015 01/18/18 0457 01/18/18 0813  GLUCAP 80 92 86 73 74     Recent Results (from the past 240 hour(s))  MRSA PCR Screening     Status: None   Collection Time: 01/16/18  6:42  AM  Result Value Ref Range Status   MRSA by PCR NEGATIVE NEGATIVE Final    Comment:        The GeneXpert MRSA Assay (FDA approved for NASAL specimens only), is one component of a comprehensive MRSA colonization surveillance program. It is not intended to diagnose MRSA infection nor to guide or monitor treatment for MRSA infections. Performed at [facility], [place] [address] [place], [place] [address]       Radiology Studies: Dg Abd Portable 1v-small Bowel Obstruction Protocol-initial, 8 Hr Delay  Result Date: 01/17/2018 CLINICAL DATA:  Small-bowel obstruction, 8 hour delay EXAM: PORTABLE ABDOMEN - 1 VIEW COMPARISON:  CT 01/16/2018 and abdomen radiographs 01/16/2018. FINDINGS: Oral contrast is seen within nonobstructed, nondistended  large bowel to the level of the rectum. No significant small bowel dilatation is seen. No free air. Loculated right effusion. IMPRESSION: Oral contrast is seen within the colon without obstruction. Partially included loculated right effusion. Electronically Signed   By: Ashley Royalty M.D.   On: 01/17/2018 01:44     Medications:  Scheduled: . apixaban  5 mg Oral BID  . dextrose  25 mL Intravenous Once  . docusate sodium  100 mg Oral BID  . gi cocktail  30 mL Oral Once  . lisinopril  5 mg Oral Daily  . pantoprazole  40 mg Oral Daily  . polyethylene glycol  17 g Oral Daily  . sevelamer carbonate  1,600 mg Oral TID WC   Continuous: . dextrose 30 mL/hr at 01/16/18 1533   CBU:LAGTXMIWOEH, hydrOXYzine, morphine injection, oxyCODONE-acetaminophen, traMADol  Assessment/Plan:    Small bowel obstruction Patient was admitted to the hospital.  NG tube was ordered with the patient refused.  Patient was seen by general surgery.  He underwent repeat x-ray yesterday after being given oral contrast.  Contrast noted in the colon.  SBO appears to be improving.  He has had bowel movements.  He is passed gas this morning.  Diet has been advanced and he is tolerating it well.  Mobilize.  Hypoglycemia Secondary to poor oral intake due to SBO.  Diet being advanced.  End-stage renal disease on hemodialysis Nephrology is following.  He is on Monday Wednesday Friday schedule.  Apparently could not be dialyzed yesterday due to staffing issues and he refused dialysis at 3 AM today.  Hopefully he will be dialyzed today or tomorrow.  Defer to nephrology.    Right-sided pleural effusion Apparently he underwent thoracentesis at [facility] earlier this month and was found to have transudate of effusion.  This is most likely due to noncompliance with dialysis treatments.  He does not appear to be dyspneic currently.  He appears to have reaccumulated pleural fluid.  Might benefit from another thoracentesis prior to  discharge.  Although this is not an urgent issue at this time.  Consult IR for thoracentesis tomorrow.  Anemia of chronic kidney disease Stable.  Essential hypertension Blood pressure noted to be elevated.  Continue to monitor closely.  Chronically elevated troponin No further work-up.  History of DVT and PE Apparently on Eliquis.  Previously had been on warfarin but was noncompliant.  Unclear if he is compliant with this medication.  Patient was started on IV heparin but he refused blood draws so heparin was discontinued.  He was started on prophylactic doses of Lovenox. No recent venous thromboembolism.  Venous Doppler study of his upper extremities in June showed chronic DVT.  Back in August 2018 he was found to have  acute DVT in the right upper extremity.  Eliquis has been resumed.  History of pancreatic pseudocyst He is status post cystogastrostomy with stent placement in March.  Apparently this was done at [facility].  It looks like the stent was removed on June 14 based on care everywhere notes  DVT Prophylaxis: Lovenox Code Status: Full code Family Communication: Discussed with the patient Disposition Plan: Mobilize.  Diet being advanced today.  Plan on thoracentesis tomorrow.    LOS: 2 days   [place] [facility] Pager [phone] 01/18/2018, 9:49 AM  If 7PM-7AM, please contact night-coverage at [email or web address], password [facility]

## 2018-01-18 NOTE — Progress Notes (Signed)
[facility] Progress Note     Subjective: CC: abdominal pain Pain in LLQ is unchanged. Patient denies nausea, tolerating FLD. Passing flatus and having bowel movements. Reports 2-3 watery BMs yesterday. Wants to eat and hoping to go home tomorrow.   Objective: Vital signs in last 24 hours: Temp:  [97.4 F (36.3 C)-97.6 F (36.4 C)] 97.4 F (36.3 C) (06/30 0501) Pulse Rate:  [86-90] 86 (06/30 0501) Resp:  [14-18] 18 (06/30 0501) BP: (145-171)/(105-127) 165/114 (06/30 0501) SpO2:  [92 %-97 %] 92 % (06/30 0501) Last BM Date: 01/17/18  Intake/Output from previous day: 06/29 0701 - 06/30 0700 In: 2420 [P.O.:2300; I.V.:120] Out: -  Intake/Output this shift: No intake/output data recorded.  PE: Gen:  Alert, NAD, pleasant Card:  Regular rate and rhythm, pedal pulses 2+ BL Pulm:  Normal effort, clear to auscultation bilaterally Abd: Soft, TTP in left abdomen, moderately distended, bowel sounds hypoactive Skin: warm and dry, no rashes  Psych: A&Ox3   Lab Results:  Recent Labs    01/16/18 0002 01/17/18 2355  WBC 5.6 3.7*  HGB 11.3* 10.7*  HCT 36.1* 34.6*  PLT 234 249   BMET Recent Labs    01/16/18 0002 01/18/18 0020  NA 137 138  K 4.5 5.1  CL 99 99  CO2 23 25  GLUCOSE 75 81  BUN 35* 45*  CREATININE 9.39* 12.27*  CALCIUM 9.1 9.7   PT/INR No results for input(s): LABPROT, INR in the last 72 hours. CMP     Component Value Date/Time   NA 138 01/18/2018 0020   K 5.1 01/18/2018 0020   CL 99 01/18/2018 0020   CO2 25 01/18/2018 0020   GLUCOSE 81 01/18/2018 0020   BUN 45 (H) 01/18/2018 0020   CREATININE 12.27 (H) 01/18/2018 0020   CALCIUM 9.7 01/18/2018 0020   PROT 7.6 01/16/2018 0002   ALBUMIN 2.8 (L) 01/18/2018 0020   AST 30 01/16/2018 0002   ALT 11 01/16/2018 0002   ALKPHOS 82 01/16/2018 0002   BILITOT 1.1 01/16/2018 0002   GFRNONAA 4 (L) 01/18/2018 0020   GFRAA 5 (L) 01/18/2018 0020   Lipase     Component Value Date/Time   LIPASE 37  01/16/2018 0002       Studies/Results: Dg Abd Portable 1v-small Bowel Obstruction Protocol-initial, 8 Hr Delay  Result Date: 01/17/2018 CLINICAL DATA:  Small-bowel obstruction, 8 hour delay EXAM: PORTABLE ABDOMEN - 1 VIEW COMPARISON:  CT 01/16/2018 and abdomen radiographs 01/16/2018. FINDINGS: Oral contrast is seen within nonobstructed, nondistended large bowel to the level of the rectum. No significant small bowel dilatation is seen. No free air. Loculated right effusion. IMPRESSION: Oral contrast is seen within the colon without obstruction. Partially included loculated right effusion. Electronically Signed   By: Ashley Royalty M.D.   On: 01/17/2018 01:44    Anti-infectives: Anti-infectives (From admission, onward)   None       Assessment/Plan Currently ESRD on dialysis History DVT and PE on Eliquis CHF with EF 35% Chronic pancreatitis Diet-controlled diabetes Hypertension GERD Anxiety and depression Chronically elevated troponin Hx of renal transplant R pleural effusion - medicine considering IR thoracentesis  PSBO - clinically resolving - etiology of abdominal pain unclear but patient has been in ED several times in the last few months for pain so this seems to be acute on chronic - could this be related to L kidney lesion? - patient had 2-3 BM yesterday and wanting to eat - advance to renal diet - stable for d/c  from a surgical standpoint if tolerating a diet and having bowel function - recommend colace/miralax for bowel regimen  FEN: renal diet VTE: SCDs, lovenox ID: no abx    LOS: 2 days    Brigid Re , [facility] 01/18/2018, 8:41 AM Pager: [phone] Consults: [phone] Mon-Fri 7:00 am-4:30 pm Sat-Sun 7:00 am-11:30 am

## 2018-01-18 NOTE — Progress Notes (Addendum)
Subjective:  Refused HD late last night (3 am 2/2  Staffing issues ) "too late "" ,  abd pain  improving .   Objective Vital signs in last 24 hours: Vitals:   01/17/18 1340 01/17/18 2148 01/18/18 0501 01/18/18 1052  BP: (!) 145/105 (!) 171/127 (!) 165/114 (!) 154/100  Pulse: 87 90 86   Resp: _0 Temp: 97.6 F (36.4 C) (!) 97.5 F (36.4 C) (!) 97.4 F (36.3 C)   TempSrc: Oral Oral Oral   SpO2: 97% 95% 92%   Weight:      Height:       Weight change:   Physical Exam: General:NAD,alert thin chronically ill AAM  Heart:RRR no m, r g Lungs:CTAleft / R side decreased lower  BS / no sob  Abdomen:BS + , improved tenderness Left  . Distended improved from admit  Extremities:2+ pedal edema Dialysis Access:+ bruit L UA AVF    OP Dialysis Orders: [facility] 4h 400/800 74.5kg 2 k/2.25 cabath Heparin none Left arm avf hectorol 7ug tiw mircera 225 every 2 wks(last 01/07/18) VEN. 50MG Q WK  Problem/Plan: 1. ESRD -HD normal OP [facility] , HD last thurs 6/27 sec missed op / HD today as above  / back on today and MWF Scr up 12.3 stressed to pt hd need to prevent UREMIC  GI symptoms  2. SBO- symptomatically improved / tx   per admit. CCS consulted noted clear for dc home if tolerating diet    3. R Pl Effusion Recurent - per admit (not reducable by HD but has missed several txs) 4. Hypertension/volume - bp Incr ,attempt uf with hd today ,?? bp med compliance in past  5. Anemiaof ESRD- hgb 10.7 No esa needs till next week , fu hgb trend fe weekly on wed hd  6. MBD- Hec . On Hd , binders when eating /On Renvela  As op  With SBO will dc ( ho Ferrlecit ALLERGIC RXN Avoid iron based binder ) DW PT use Fosrenol 580m ac at start then 1gm  Ac in 2days   Binder 7. 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 HO pancreatitis with pesudocsyt /stent Sp [facility] [place]   DErnest Haber PA-C [phone]/30/2019,11:10 AM  LOS: 2 days    Pt seen, examined and agree w A/P as above.  RKelly SplinterMD [facility] pager [phone]  01/18/2018, 12:53 PM    Labs: Basic Metabolic Panel: Recent Labs  Lab 01/16/18 0002 01/18/18 0020  NA 137 138  K 4.5 5.1  CL 99 99  CO2 23 25  GLUCOSE 75 81  BUN 35* 45*  CREATININE 9.39* 12.27*  CALCIUM 9.1 9.7  PHOS  --  8.3*   Liver Function Tests: Recent Labs  Lab 01/16/18 0002 01/18/18 0020  AST 30  --   ALT 11  --   ALKPHOS 82  --   BILITOT 1.1  --   PROT 7.6  --   ALBUMIN 2.8* 2.8*   Recent Labs  Lab 01/16/18 0002  LIPASE 37   No results for input(s): AMMONIA in the last 168 hours. CBC: Recent Labs  Lab 01/16/18 0002 01/17/18 2355  WBC 5.6 3.7*  NEUTROABS 4.2  --   HGB 11.3* 10.7*  HCT 36.1* 34.6*  MCV 85.3 85.9  PLT 234 249   Cardiac Enzymes: Recent Labs  Lab 01/16/18 0002 01/16/18 0312  TROPONINI 0.04* 0.04*   CBG: Recent  Labs  Lab 01/17/18 1725 01/17/18 2019 01/18/18 0015 01/18/18 0457 01/18/18 0813  GLUCAP 80 92 86 73 74     Medications: . dextrose 30 mL/hr at 01/16/18 1533   . apixaban  5 mg Oral BID  . dextrose  25 mL Intravenous Once  . docusate sodium  100 mg Oral BID  . gi cocktail  30 mL Oral Once  . lisinopril  5 mg Oral Daily  . pantoprazole  40 mg Oral Daily  . polyethylene glycol  17 g Oral Daily  . sevelamer carbonate  1,600 mg Oral TID WC

## 2018-01-18 NOTE — Progress Notes (Signed)
Hemodialysis cancelled for today by Dr.Robert Schertz. Rescheduled for 01/18/18 _0  Patient and patients nurse notified of scheduled change at 2330.

## 2018-01-18 NOTE — Discharge Instructions (Addendum)
Small Bowel Obstruction A small bowel obstruction means that something is blocking the small bowel. The small bowel is also called the small intestine. It is the long tube that connects the stomach to the colon. An obstruction will stop food and fluids from passing through the small bowel. Treatment depends on what is causing the problem and how bad the problem is. Follow these instructions at home:  Get a lot of rest.  Follow your diet as told by your doctor. You may need to: ? Only drink clear liquids until you start to get better. ? Avoid solid foods as told by your doctor.  Take over-the-counter and prescription medicines only as told by your doctor.  Keep all follow-up visits as told by your doctor. This is important. Contact a doctor if:  You have a fever.  You have chills. Get help right away if:  You have pain or cramps that get worse.  You throw up (vomit) blood.  You have a feeling of being sick to your stomach (nausea) that does not go away.  You cannot stop throwing up.  You cannot drink fluids.  You feel confused.  You feel dry or thirsty (dehydrated).  Your belly gets more bloated.  You feel weak or you pass out (faint). This information is not intended to replace advice given to you by your health care provider. Make sure you discuss any questions you have with your health care provider. Document Released: 08/15/2004 Document Revised: 03/04/2016 Document Reviewed: 09/01/2014 Elsevier Interactive Patient Education  2018 [place] on my medicine - ELIQUIS (apixaban)  This medication education was reviewed with me or my healthcare representative as part of my discharge preparation.  The pharmacist that spoke with me during my hospital stay was:  Wayland Salinas, [facility]  Why was Eliquis prescribed for you? Eliquis was prescribed to treat blood clots that may have been found in the veins of your legs (deep vein thrombosis) or  in your lungs (pulmonary embolism) and to reduce the risk of them occurring again.  What do You need to know about Eliquis ? The dose is 5 mg tablet taken TWICE daily.  Eliquis may be taken with or without food.   Try to take the dose about the same time in the morning and in the evening. If you have difficulty swallowing the tablet whole please discuss with your pharmacist how to take the medication safely.  Take Eliquis exactly as prescribed and DO NOT stop taking Eliquis without talking to the doctor who prescribed the medication.  Stopping may increase your risk of developing a new blood clot.  Refill your prescription before you run out.  After discharge, you should have regular check-up appointments with your healthcare provider that is prescribing your Eliquis.    What do you do if you miss a dose? If a dose of ELIQUIS is not taken at the scheduled time, take it as soon as possible on the same day and twice-daily administration should be resumed. The dose should not be doubled to make up for a missed dose.  Important Safety Information A possible side effect of Eliquis is bleeding. You should call your healthcare provider right away if you experience any of the following: ? Bleeding from an injury or your nose that does not stop. ? Unusual colored urine (red or dark brown) or unusual colored stools (red or black). ? Unusual bruising for unknown reasons. ? A serious fall or if you hit your head (  even if there is no bleeding).  Some medicines may interact with Eliquis and might increase your risk of bleeding or clotting while on Eliquis. To help avoid this, consult your healthcare provider or pharmacist prior to using any new prescription or non-prescription medications, including herbals, vitamins, non-steroidal anti-inflammatory drugs (NSAIDs) and supplements.  This website has more information on Eliquis (apixaban): http://www.eliquis.com/eliquis/home

## 2018-01-19 ENCOUNTER — Encounter (HOSPITAL_COMMUNITY): Payer: Self-pay | Admitting: Student

## 2018-01-19 ENCOUNTER — Inpatient Hospital Stay (HOSPITAL_COMMUNITY): Payer: Medicare Other

## 2018-01-19 HISTORY — PX: IR THORACENTESIS ASP PLEURAL SPACE W/IMG GUIDE: IMG5380

## 2018-01-19 LAB — GLUCOSE, CAPILLARY
GLUCOSE-CAPILLARY: 70 mg/dL (ref 70–99)
GLUCOSE-CAPILLARY: 88 mg/dL (ref 70–99)
GLUCOSE-CAPILLARY: 90 mg/dL (ref 70–99)

## 2018-01-19 LAB — BASIC METABOLIC PANEL
ANION GAP: 13 (ref 5–15)
BUN: 20 mg/dL (ref 6–20)
CALCIUM: 9.1 mg/dL (ref 8.9–10.3)
CO2: 25 mmol/L (ref 22–32)
CREATININE: 7.54 mg/dL — AB (ref 0.61–1.24)
Chloride: 98 mmol/L (ref 98–111)
GFR calc Af Amer: 8 mL/min — ABNORMAL LOW (ref >60)
GFR, EST NON AFRICAN AMERICAN: 7 mL/min — AB (ref >60)
Glucose, Bld: 66 mg/dL — ABNORMAL LOW (ref 70–99)
Potassium: 3.9 mmol/L (ref 3.5–5.1)
SODIUM: 136 mmol/L (ref 135–145)

## 2018-01-19 MED ORDER — POLYVINYL ALCOHOL 1.4 % OP SOLN
1.0000 [drp] | OPHTHALMIC | Status: DC | PRN
  Administered 2018-01-19: 1 [drp] via OPHTHALMIC
  Filled 2018-01-19: qty 15

## 2018-01-19 MED ORDER — LIDOCAINE HCL (PF) 2 % IJ SOLN
INTRAMUSCULAR | Status: AC
  Filled 2018-01-19: qty 20

## 2018-01-19 MED ORDER — OXYCODONE-ACETAMINOPHEN 5-325 MG PO TABS: 1.0000 | ORAL_TABLET | Freq: Four times a day (QID) | ORAL | 0 refills | Status: DC | PRN

## 2018-01-19 MED ORDER — LISINOPRIL 5 MG PO TABS: 5.0000 mg | ORAL_TABLET | Freq: Every day | ORAL | 0 refills | Status: DC

## 2018-01-19 MED ORDER — APIXABAN 5 MG PO TABS: 5.0000 mg | ORAL_TABLET | Freq: Two times a day (BID) | ORAL | Status: DC

## 2018-01-19 MED ORDER — DOCUSATE SODIUM 100 MG PO CAPS: 100.0000 mg | ORAL_CAPSULE | Freq: Two times a day (BID) | ORAL | 0 refills | Status: DC

## 2018-01-19 MED ORDER — POLYETHYLENE GLYCOL 3350 17 G PO PACK: 17.0000 g | PACK | Freq: Every day | ORAL | 0 refills | Status: DC

## 2018-01-19 MED ORDER — LIDOCAINE HCL (PF) 2 % IJ SOLN
INTRAMUSCULAR | Status: DC | PRN
  Administered 2018-01-19: 10 mL

## 2018-01-19 MED ORDER — CHLORHEXIDINE GLUCONATE CLOTH 2 % EX PADS: 6.0000 | MEDICATED_PAD | Freq: Every day | CUTANEOUS | Status: DC

## 2018-01-19 NOTE — Progress Notes (Signed)
[facility] Progress Note   Subjective:  Seen in room. No complaints. Wants to eat.  Had HD yesterday d/t scheduling. Net UF 3.3L. No complaints.  For thoracentesis today.   Objective Vitals:   01/18/18 2226 01/19/18 0045 01/19/18 0310 01/19/18 0503  BP: (!) 181/123 (!) 181/123 (!) 167/116 (!) 171/101  Pulse:    93  Resp:    18  Temp:    98.7 F (37.1 C)  TempSrc:    Oral  SpO2:    98%  Weight:      Height:       Physical Exam General:  WNWD male sitting in recliner NAD Heart: RRR  Lungs: grossly clear, faint crackles R base Abdomen: soft NT/ND Extremities: trace LE edema  Dialysis Access: L arm AVF +bruit   [facility] Orders:  [facility] 400/800 74.5kg 2 k/2.25 cabath Heparin none Left arm avf hectorol 7ug tiw mircera 225 every 2 wks(last 01/07/18) Venofer 69m IV q week    Assessment/Plan: 1. ESRD -Usually MWF. Plan for dialysis today on schedule after thoracentesis. Hopefully for discharge after HD.  2. SBO-symptomatically improved / txper admit. Surgery evaluated and cleared for discharge.  3. R Pl Effusion Recurent - per admit (not reducable by HD but has missed several txs). For thoracentesis 7/1 4. Hypertension/volume - BP elevated. Continue to push volume down with HD as tolerated.  5. Anemiaof ESRD- hgb 10.7 No esa needs till next week  6. MBD-Renvela dc'd d/t SBO ( ho Ferrlecit ALLERGIC RXN Avoid iron based binder ) Discussed with patient and binder changed to Fosrenol 1000 mg TID qac.  7. 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 HOpancreatitis with pesudocsyt /stent s/pdrain removal ()   OLynnda ChildPA-C CDeltanaPager [phone]/07/2017,8:53 AM  LOS: 3 days   Additional Objective Labs: Basic Metabolic Panel: Recent Labs  Lab 01/16/18 0002 01/18/18 0020 01/19/18 0451  NA 137 138 136  K 4.5 5.1 3.9  CL 99 99 98  CO2 _0 GLUCOSE 75 81 66*  BUN 35* 45* 20  CREATININE 9.39*  12.27* 7.54*  CALCIUM 9.1 9.7 9.1  PHOS  --  8.3*  --    CBC: Recent Labs  Lab 01/16/18 0002 01/17/18 2355  WBC 5.6 3.7*  NEUTROABS 4.2  --   HGB 11.3* 10.7*  HCT 36.1* 34.6*  MCV 85.3 85.9  PLT 234 249   Blood Culture    Component Value Date/Time   SDES NASOPHARYNGEAL 09/22/2017 0723   SPECREQUEST NONE 09/22/2017 0723   CULT  09/22/2017 0723    NO MRSA DETECTED [facility] [address] [place] [address]   REPTSTATUS 09/23/2017 FINAL 09/22/2017 0723    Cardiac Enzymes: Recent Labs  Lab 01/16/18 0002 01/16/18 0312  TROPONINI 0.04* 0.04*   CBG: Recent Labs  Lab 01/18/18 1220 01/18/18 2105 01/19/18 0037 01/19/18 0457 01/19/18 0805  GLUCAP 81 90 90 70 88   Iron Studies: No results for input(s): IRON, TIBC, TRANSFERRIN, FERRITIN in the last 72 hours. Lab Results  Component Value Date   INR 1.56 09/21/2017   INR 1.28 09/04/2017   INR 1.13 09/03/2017   Medications: . dextrose 30 mL/hr at 01/16/18 1533   . apixaban  5 mg Oral BID  . dextrose  25 mL Intravenous Once  . docusate sodium  100 mg Oral BID  . gi cocktail  30 mL Oral Once  . lanthanum  1,000 mg Oral TID WC  . lanthanum  500 mg Oral TID WC  . lisinopril  5 mg Oral Daily  . pantoprazole  40 mg Oral Daily  . polyethylene glycol  17 g Oral Daily

## 2018-01-19 NOTE — Progress Notes (Signed)
   01/19/18 1405  Vitals  BP (!) 181/116   Checked with hemodialysis about giving prn Hydralazine, they said it was okay. See MAR. Will continue to monitor.

## 2018-01-19 NOTE — Progress Notes (Signed)
Inpatient HD pushed to 7/2 due to full schedule in unit.   Spoke with RN at outpatient dialysis center. Patient has spot available at 12pm on Tuesday 7/2 at [facility]. Informed patient and he is agreeable. Also discussed with primary.    Lynnda Child PA-C [facility] Pager [phone] 01/19/2018,4:46 PM

## 2018-01-19 NOTE — Progress Notes (Signed)
Hemodialysis canceled for today by Dr. Posey Pronto. Rescheduled for 01-20-18. Patient and patient's nurse Octavio Graves, RN notified by Leatrice Jewels, RN. Orders written for today and will be used for next scheduled hemodialysis treatment unless a new order is written.

## 2018-01-19 NOTE — Progress Notes (Signed)
[facility] Progress Note     Subjective: CC: dry eyes Patient complaining of dry eyes and requesting eye drops. Tolerating renal diet and not nauseated. Reports decreased flatus and no BM overnight. Chronic abdominal pain unchanged. Concerned about thoracentesis today.   Objective: Vital signs in last 24 hours: Temp:  [97.5 F (36.4 C)-98.7 F (37.1 C)] 98.7 F (37.1 C) (07/01 0503) Pulse Rate:  [80-98] 93 (07/01 0503) Resp:  [12-20] 18 (07/01 0503) BP: (144-187)/(100-131) 171/101 (07/01 0503) SpO2:  [95 %-98 %] 98 % (07/01 0503) Weight:  [73.6 kg (162 lb 4.1 oz)-77.2 kg (170 lb 3.1 oz)] 73.6 kg (162 lb 4.1 oz) (06/30 2015) Last BM Date: 01/17/18  Intake/Output from previous day: 06/30 0701 - 07/01 0700 In: 720 [P.O.:720] Out: 3307  Intake/Output this shift: No intake/output data recorded.  PE: Gen:  Alert, NAD, pleasant Card:  Regular rate and rhythm, pedal pulses 2+ BL Pulm:  Normal effort, clear to auscultation bilaterally Abd: Soft, TTP in left abdomen, moderately distended, bowel sounds hypoactive Skin: warm and dry, no rashes  Psych: A&Ox3     Lab Results:  Recent Labs    01/17/18 2355  WBC 3.7*  HGB 10.7*  HCT 34.6*  PLT 249   BMET Recent Labs    01/18/18 0020 01/19/18 0451  NA 138 136  K 5.1 3.9  CL 99 98  CO2 25 25  GLUCOSE 81 66*  BUN 45* 20  CREATININE 12.27* 7.54*  CALCIUM 9.7 9.1   PT/INR No results for input(s): LABPROT, INR in the last 72 hours. CMP     Component Value Date/Time   NA 136 01/19/2018 0451   K 3.9 01/19/2018 0451   CL 98 01/19/2018 0451   CO2 25 01/19/2018 0451   GLUCOSE 66 (L) 01/19/2018 0451   BUN 20 01/19/2018 0451   CREATININE 7.54 (H) 01/19/2018 0451   CALCIUM 9.1 01/19/2018 0451   PROT 7.6 01/16/2018 0002   ALBUMIN 2.8 (L) 01/18/2018 0020   AST 30 01/16/2018 0002   ALT 11 01/16/2018 0002   ALKPHOS 82 01/16/2018 0002   BILITOT 1.1 01/16/2018 0002   GFRNONAA 7 (L) 01/19/2018 0451   GFRAA 8  (L) 01/19/2018 0451   Lipase     Component Value Date/Time   LIPASE 37 01/16/2018 0002       Studies/Results: No results found.  Anti-infectives: Anti-infectives (From admission, onward)   None       Assessment/Plan Currently ESRD on dialysis History DVT and PE on Eliquis CHF with EF 35% Chronic pancreatitis Diet-controlled diabetes Hypertension GERD Anxiety and depression Chronically elevated troponin Hx of renal transplant R pleural effusion - IR thoracentesis today  PSBO - clinically resolving - etiology of abdominal pain unclear but patient has been in ED several times in the last few months for pain so this seems to be acute on chronic - could this be related to L kidney lesion? - tolerating renal diet - stable for d/c from a surgical standpoint if tolerating a diet and having bowel function - recommend colace/miralax for bowel regimen, discussed with patient that I would continue this at home  FEN: renal diet VTE: SCDs, lovenox ID: no abx  Stable for discharge from a general surgery standpoint.    LOS: 3 days    Brigid Re , [facility] 01/19/2018, 8:01 AM Pager: [phone] Consults: [phone] Mon-Fri 7:00 am-4:30 pm Sat-Sun 7:00 am-11:30 am

## 2018-01-19 NOTE — Discharge Summary (Signed)
[facility]  Physician Discharge Summary   Patient ID: Frank [name] MRN: [id number] DOB/AGE: 54-17-1967 54 y.o.  Admit date: 01/15/2018 Discharge date: 01/19/2018  PCP: Patient, [name]  DISCHARGE DIAGNOSES:  Small bowel obstruction, resolved End-stage renal disease  RECOMMENDATIONS FOR OUTPATIENT FOLLOW UP: 1. Patient to go for dialysis tomorrow at his outpatient center   DISCHARGE CONDITION: fair  Diet recommendation: As before  [facility] Weights   01/16/18 0559 01/18/18 1530 01/18/18 2015  Weight: 76.9 kg (169 lb 8.5 oz) 77.2 kg (170 lb 3.1 oz) 73.6 kg (162 lb 4.1 oz)    INITIAL HISTORY: 52 year old African-American male with a past medical history of end-stage renal disease on dialysis on Monday Wednesday Friday, chronically elevated troponin, chronic pancreatitis, history of DVT and PE on Eliquis, history of noncompliance presented with nausea vomiting and abdominal pain.  Evaluation in the emergency department included CT scan which shows findings suggestive of small bowel obstruction.  Patient was hospitalized for further management.  Consultants: Nephrology.  General surgery.  Procedures:  Hemodialysis Thoracentesis   HOSPITAL COURSE:   Small bowel obstruction Patient was admitted to the hospital.  NG tube was ordered with the patient refused.  Patient was seen by general surgery.  Patient underwent repeat films after he was given contrast.  Contrast was noted in the colon.  Patient has improved.  He is tolerating his diet.  Seen by general surgery this morning.  Discussed with Dr. Donne Hazel.  No further intervention.  Okay for discharge from their perspective.  If patient has recurrence of his symptoms then he may need surgical intervention.   Hypoglycemia Secondary to poor oral intake due to SBO.    Resolved when diet was advanced.  End-stage renal disease on hemodialysis Nephrology is following.  He is on Monday Wednesday Friday schedule.  His  dialysis this has been sort of haphazard the last few days.  He could not be scheduled for dialysis today in the hospital but nephrology has arranged for dialysis in his center tomorrow.  Patient is agreeable.    Right-sided pleural effusion Apparently he underwent thoracentesis at [facility] earlier this month and was found to have transudative effusion.  This is most likely due to noncompliance with dialysis treatments. He appears to have reaccumulated pleural fluid.    He underwent thoracentesis today with removal of 1.5 L of clear liquid.  Patient feels better.  Chest x-ray does not show any pneumothorax.  Anemia of chronic kidney disease Stable.  Essential hypertension Blood pressure noted to be elevated.    Previous reports reviewed previous visits noted.  Always has high blood pressure with systolic 443.  Chronically elevated troponin No further work-up.  History of DVT and PE Apparently on Eliquis.  Previously had been on warfarin but was noncompliant.  Unclear if he is compliant with this medication.  Patient was started on IV heparin but he refused blood draws so heparin was discontinued.  He was started on prophylactic doses of Lovenox. No recent venous thromboembolism.  Venous Doppler study of his upper extremities in June showed chronic DVT.  Back in August 2018 he was found to have acute DVT in the right upper extremity.  Eliquis has been resumed.  History of pancreatic pseudocyst He is status post cystogastrostomy with stent placement in March.  Apparently this was done at [facility].  It looks like the stent was removed on June 14 based on care everywhere notes  Overall stable.  Okay for discharge home today.  He will go for his dialysis schedule at his outpatient center tomorrow.    PERTINENT LABS:  The results of significant diagnostics from this hospitalization (including imaging, microbiology, ancillary and laboratory) are listed below for reference.     Microbiology: Recent Results (from the past 240 hour(s))  MRSA PCR Screening     Status: None   Collection Time: 01/16/18  6:42 AM  Result Value Ref Range Status   MRSA by PCR NEGATIVE NEGATIVE Final    Comment:        The GeneXpert MRSA Assay (FDA approved for NASAL specimens only), is one component of a comprehensive MRSA colonization surveillance program. It is not intended to diagnose MRSA infection nor to guide or monitor treatment for MRSA infections. Performed at [facility], [place] [address], [place], [place] [address]      Labs: Basic Metabolic Panel: Recent Labs  Lab 01/16/18 0002 01/18/18 0020 01/19/18 0451  NA 137 138 136  K 4.5 5.1 3.9  CL 99 99 98  CO2 _0 GLUCOSE 75 81 66*  BUN 35* 45* 20  CREATININE 9.39* 12.27* 7.54*  CALCIUM 9.1 9.7 9.1  PHOS  --  8.3*  --    Liver Function Tests: Recent Labs  Lab 01/16/18 0002 01/18/18 0020  AST 30  --   ALT 11  --   ALKPHOS 82  --   BILITOT 1.1  --   PROT 7.6  --   ALBUMIN 2.8* 2.8*   Recent Labs  Lab 01/16/18 0002  LIPASE 37   CBC: Recent Labs  Lab 01/16/18 0002 01/17/18 2355  WBC 5.6 3.7*  NEUTROABS 4.2  --   HGB 11.3* 10.7*  HCT 36.1* 34.6*  MCV 85.3 85.9  PLT 234 249   Cardiac Enzymes: Recent Labs  Lab 01/16/18 0002 01/16/18 0312  TROPONINI 0.04* 0.04*    CBG: Recent Labs  Lab 01/18/18 1220 01/18/18 2105 01/19/18 0037 01/19/18 0457 01/19/18 0805  GLUCAP 81 90 90 70 88     IMAGING STUDIES Ct Abdomen Pelvis Wo Contrast  Result Date: 01/16/2018 CLINICAL DATA:  52 y/o M; right upper quadrant abdominal pain and vomiting. EXAM: CT ABDOMEN AND PELVIS WITHOUT CONTRAST TECHNIQUE: Multidetector CT imaging of the abdomen and pelvis was performed following the standard protocol without IV contrast. COMPARISON:  12/01/2017, 08/25/2017, 06/24/2017, 04/20/2017, 07/29/2016 CT abdomen and pelvis. FINDINGS: Lower chest: Large right pleural effusion. Stable pericardial  effusion and cardiomegaly. Hepatobiliary: No focal liver abnormality is seen. No gallstones, gallbladder wall thickening, or biliary dilatation. Pancreas: Unremarkable. No pancreatic ductal dilatation or surrounding inflammatory changes. Spleen: Normal in size without focal abnormality. Adrenals/Urinary Tract: Normal adrenal glands. Atrophic kidneys. Left kidney indeterminate lesion measuring 3.3 cm, stable to decreased in size in comparison with prior abdomen and pelvis CTs. Left pelvic renal transplant with extensive vascular calcification, atrophy, and diffuse loss of normal architecture. Stomach/Bowel: No obstructive or inflammatory changes of the large bowel. Normal stomach. Multiple dilated loops of small bowel in the mid abdomen with wall thickening. No findings of perforation. Vascular/Lymphatic: Aortic atherosclerosis. No enlarged abdominal or pelvic lymph nodes. Reproductive: Prostate is unremarkable. Other: Small volume of ascites amongst small-bowel and mesenteric edema. Musculoskeletal: No fracture is seen. IMPRESSION: 1. Dilated loops of small bowel in the mid abdomen with wall thickening likely representing small bowel obstruction. No findings of perforation. 2. Small volume of ascites amongst small-bowel. Mild mesenteric edema. 3. Large right pleural effusion increased from prior CT. Stable small pericardial  effusion and cardiomegaly. 4. Left kidney indeterminate lesion stable to decreased in size in comparison with prior CTs of the abdomen and pelvis. 5. Aortic atherosclerosis. Electronically Signed   By: Kristine Garbe M.D.   On: 01/16/2018 04:43   Dg Chest 1 View  Result Date: 01/19/2018 CLINICAL DATA:  Status post right thoracentesis EXAM: CHEST  1 VIEW COMPARISON:  01/16/2018 FINDINGS: Previously seen right pleural effusion has reduced significantly following thoracentesis. No pneumothorax is noted. Cardiac shadow remains enlarged. No focal infiltrate is seen. Mild central vascular  congestion is noted without edema. IMPRESSION: Status post right thoracentesis without pneumothorax. Electronically Signed   By: Inez Catalina M.D.   On: 01/19/2018 12:02    Dg Abdomen Acute W/chest  Result Date: 01/16/2018 CLINICAL DATA:  Left upper quadrant abdominal pain. Patient went to dialysis and pain started shortly thereafter. EXAM: DG ABDOMEN ACUTE W/ 1V CHEST COMPARISON:  CXR 01/05/2018 FINDINGS: AP view of the chest demonstrates cardiomegaly with loculated moderate to large right pleural effusion along the periphery of the right hemithorax. Supine and upright views of the abdomen demonstrate mild gaseous distention of small bowel with air-fluid levels on the upright suspicious for early or partial SBO versus ileus. No free air. No radiopaque calculi nor acute osseous abnormality. IMPRESSION: 1. Cardiomegaly with moderate to large right loculated pleural effusion. 2. Abnormal bowel gas pattern with mild to moderately distended small bowel with air-fluid levels suspicious for early or partial SBO versus small bowel ileus. Electronically Signed   By: Ashley Royalty M.D.   On: 01/16/2018 03:42   Dg Abd Portable 1v-small Bowel Obstruction Protocol-initial, 8 Hr Delay  Result Date: 01/17/2018 CLINICAL DATA:  Small-bowel obstruction, 8 hour delay EXAM: PORTABLE ABDOMEN - 1 VIEW COMPARISON:  CT 01/16/2018 and abdomen radiographs 01/16/2018. FINDINGS: Oral contrast is seen within nonobstructed, nondistended large bowel to the level of the rectum. No significant small bowel dilatation is seen. No free air. Loculated right effusion. IMPRESSION: Oral contrast is seen within the colon without obstruction. Partially included loculated right effusion. Electronically Signed   By: Ashley Royalty M.D.   On: 01/17/2018 01:44   Ir Thoracentesis Asp Pleural Space W/img Guide  Result Date: 01/19/2018 INDICATION: Patient with history of shortness of breath and recurrent right-sided pleural effusion. Request is made for  therapeutic right thoracentesis. EXAM: ULTRASOUND GUIDED THERAPEUTIC RIGHT THORACENTESIS MEDICATIONS: 10 mL of 2% lidocaine COMPLICATIONS: None immediate. PROCEDURE: An ultrasound guided thoracentesis was thoroughly discussed with the patient and questions answered. The benefits, risks, alternatives and complications were also discussed. The patient understands and wishes to proceed with the procedure. Written consent was obtained. Ultrasound was performed to localize and mark an adequate pocket of fluid in the right chest. The area was then prepped and draped in the normal sterile fashion. 2% Lidocaine was used for local anesthesia. Under ultrasound guidance a 6 Fr Safe-T-Centesis catheter was introduced. Thoracentesis was performed. The catheter was removed and a dressing applied. FINDINGS: A total of approximately 1.5 L of clear gold fluid was removed. IMPRESSION: Successful ultrasound guided right thoracentesis yielding 1.5 L of pleural fluid. Read by: Earley Abide, PA-C Electronically Signed   By: Sandi Mariscal M.D.   On: 01/19/2018 12:24    DISCHARGE EXAMINATION: See progress note from earlier today  DISPOSITION: Home  Discharge Instructions    Call MD for:  extreme fatigue   Complete by:  As directed    Call MD for:  persistant dizziness or light-headedness   Complete by:  As directed    Call MD for:  persistant nausea and vomiting   Complete by:  As directed    Call MD for:  severe uncontrolled pain   Complete by:  As directed    Call MD for:  temperature >100.4   Complete by:  As directed    Discharge instructions   Complete by:  As directed    Please seek attention if your symptoms recur.  Seek attention if abdominal pain worsens or if you develop nausea and vomiting.  Please go for your dialysis tomorrow as has been set up by the kidney doctor.  You were cared for by a hospitalist during your hospital stay. If you have any questions about your discharge medications or the care you  received while you were in the hospital after you are discharged, you can call the unit and asked to speak with the hospitalist on call if the hospitalist that took care of you is not available. Once you are discharged, your primary care physician will handle any further medical issues. Please note that NO REFILLS for any discharge medications will be authorized once you are discharged, as it is imperative that you return to your primary care physician (or establish a relationship with a primary care physician if you do not have one) for your aftercare needs so that they can reassess your need for medications and monitor your lab values. If you do not have a primary care physician, you can call [phone] for a physician referral.   Increase activity slowly   Complete by:  As directed         Allergies as of 01/19/2018      Reactions   Butalbital-apap-caffeine Shortness Of Breath, Swelling, Other (See Comments)   Swelling in throat   Ferrlecit [na Ferric Gluc Cplx In Sucrose] Shortness Of Breath, Swelling, Other (See Comments)   Swelling in throat, tolerates Venofor   Minoxidil Shortness Of Breath   Tylenol [acetaminophen] Anaphylaxis, Swelling   Darvocet [propoxyphene N-acetaminophen] Hives      Medication List    STOP taking these medications   cinacalcet 30 MG tablet Commonly known as:  SENSIPAR   ondansetron 4 MG tablet Commonly known as:  ZOFRAN     TAKE these medications   ammonium lactate 12 % lotion Commonly known as:  LAC-HYDRIN Apply topically as needed for dry skin.   apixaban 5 MG Tabs tablet Commonly known as:  ELIQUIS Take 1 tablet (5 mg total) by mouth 2 (two) times daily.   Darbepoetin Alfa 60 MCG/0.3ML Sosy injection Commonly known as:  ARANESP Inject 0.3 mLs (60 mcg total) into the vein every Wednesday with hemodialysis.   diclofenac sodium 1 % Gel Commonly known as:  VOLTAREN Apply 2 g topically 4 (four) times daily as needed.   docusate sodium 100 MG  capsule Commonly known as:  COLACE Take 1 capsule (100 mg total) by mouth 2 (two) times daily.   lisinopril 5 MG tablet Commonly known as:  PRINIVIL,ZESTRIL Take 1 tablet (5 mg total) by mouth daily.   multivitamin Tabs tablet Take 1 tablet by mouth at bedtime.   ondansetron 8 MG disintegrating tablet Commonly known as:  ZOFRAN-ODT Take 1 tablet (8 mg total) by mouth every 8 (eight) hours as needed for nausea or vomiting.   oxyCODONE-acetaminophen 5-325 MG tablet Commonly known as:  PERCOCET/ROXICET Take 1 tablet by mouth every 6 (six) hours as needed for severe pain (pain). What changed:  reasons to take this  pantoprazole 40 MG tablet Commonly known as:  PROTONIX Take 40 mg by mouth daily.   polyethylene glycol packet Commonly known as:  MIRALAX / GLYCOLAX Take 17 g by mouth daily. Start taking on:  01/20/2018   sevelamer carbonate 800 MG tablet Commonly known as:  RENVELA Take 3 tablets (2,400 mg total) by mouth 3 (three) times daily with meals. What changed:  how much to take        Follow-up Information    Lorretta Harp, MD .   Specialties:  Cardiology, Radiology Contact information: [address] [place] [phone]           [place]: 35 mins  [facility] Pager [phone]  01/19/2018, 6:23 PM

## 2018-01-19 NOTE — Progress Notes (Signed)
Pt discharged to home, will have dialysis outpatient tomorrow. Pt's tele and IV removed. Pt given discharge instructions - pt declined to go over them. I attempted to educate pt, pt refused education. Pt given prescriptions. Wheeled to meet his ride at emergency room entrance.

## 2018-01-19 NOTE — Procedures (Signed)
PROCEDURE SUMMARY:  Successful image-guided right thoracentesis. Yielded 1.5 liters of clear gold fluid. Patient tolerated procedure well. No immediate complications.  Specimen was not sent for labs. CXR ordered.  Claris Pong Louk PA-C 01/19/2018 11:48 AM

## 2018-01-19 NOTE — Care Management Note (Addendum)
Case Management Note  Patient Details  Name: Frank [name] MRN: [id number] Date of Birth: Apr 29, 1966  Subjective/Objective:   Presents with ABD pain, N/V, hx of hypertension, diet-controlled diabetes, GERD, depression, anxiety, DVT/PE on Eliquis, CHF, ESRD/ dialysis (MWF), chronically elevated troponin at 0.09 and chronic pancreatitis. From home alone. Pt states has [name].      7/1  S/p thoracenteis   [name] (Sister)     [phone]      PCP:  Action/Plan: Transition to home when medically stable.Marland KitchenMarland KitchenNCM for disposition needs.   Expected Discharge Date:   01/20/2018          Expected Discharge Plan:  Home/Self Care  In-House Referral:     Discharge planning Services     Post Acute Care Choice:    Choice offered to:     DME Arranged:   N/A DME Agency:   N/A  HH Arranged:   N/A HH Agency:   N/A  Status of Service:    If discussed at [place] of Stay Meetings, dates discussed:    Additional Comments:  Sharin Mons, RN 01/19/2018, 12:37 PM

## 2018-01-19 NOTE — Progress Notes (Signed)
[facility] PROGRESS NOTE  CHAPMAN MATTEUCCI YPP:[id number] DOB: 1966/03/01 DOA: 01/15/2018  PCP: Patient, [name]  Brief History/Interval Summary: 52 year old African-American male with a past medical history of end-stage renal disease on dialysis on Monday Wednesday Friday, chronically elevated troponin, chronic pancreatitis, history of DVT and PE on Eliquis, history of noncompliance presented with nausea vomiting and abdominal pain.  Evaluation in the emergency department included CT scan which shows findings suggestive of small bowel obstruction.  Patient was hospitalized for further management.  Reason for Visit: Small bowel obstruction  Consultants: Nephrology.  General surgery.  Procedures:  Hemodialysis Thoracentesis  Antibiotics: None  Subjective/Interval History: Patient states that he continues to feel well.  Tolerating his diet.  Passing gas.  Continues to have abdominal pain not as bad as before.   ROS: Denies any nausea or vomiting  Objective:  Vital Signs  Vitals:   01/18/18 2226 01/19/18 0045 01/19/18 0310 01/19/18 0503  BP: (!) 181/123 (!) 181/123 (!) 167/116 (!) 171/101  Pulse:    93  Resp:    18  Temp:    98.7 F (37.1 C)  TempSrc:    Oral  SpO2:    98%  Weight:      Height:        Intake/Output Summary (Last 24 hours) at 01/19/2018 0907 Last data filed at 01/18/2018 1950 Gross per 24 hour  Intake 420 ml  Output 3307 ml  Net -2887 ml   Filed Weights   01/16/18 0559 01/18/18 1530 01/18/18 2015  Weight: 76.9 kg (169 lb 8.5 oz) 77.2 kg (170 lb 3.1 oz) 73.6 kg (162 lb 4.1 oz)    General appearance: No distress Resp: Diminished air entry right base.  No wheezing rales or rhonchi.  Normal effort at rest. Cardio: S1-S2 is normal regular.  No S3-S4.  No rubs murmurs. GI: Soft.  Nontender nondistended Neurologic: No neurological deficits.  Lab Results:  Data Reviewed: I have personally reviewed following labs and imaging  studies  CBC: Recent Labs  Lab 01/16/18 0002 01/17/18 2355  WBC 5.6 3.7*  NEUTROABS 4.2  --   HGB 11.3* 10.7*  HCT 36.1* 34.6*  MCV 85.3 85.9  PLT 234 458    Basic Metabolic Panel: Recent Labs  Lab 01/16/18 0002 01/18/18 0020 01/19/18 0451  NA 137 138 136  K 4.5 5.1 3.9  CL 99 99 98  CO2 _0 GLUCOSE 75 81 66*  BUN 35* 45* 20  CREATININE 9.39* 12.27* 7.54*  CALCIUM 9.1 9.7 9.1  PHOS  --  8.3*  --     GFR: Estimated Creatinine Clearance: 11.8 mL/min (A) (by C-G formula based on SCr of 7.54 mg/dL (H)).  Liver Function Tests: Recent Labs  Lab 01/16/18 0002 01/18/18 0020  AST 30  --   ALT 11  --   ALKPHOS 82  --   BILITOT 1.1  --   PROT 7.6  --   ALBUMIN 2.8* 2.8*    Recent Labs  Lab 01/16/18 0002  LIPASE 37   Cardiac Enzymes: Recent Labs  Lab 01/16/18 0002 01/16/18 0312  TROPONINI 0.04* 0.04*    CBG: Recent Labs  Lab 01/18/18 1220 01/18/18 2105 01/19/18 0037 01/19/18 0457 01/19/18 0805  GLUCAP 81 90 90 70 88     Recent Results (from the past 240 hour(s))  MRSA PCR Screening     Status: None   Collection Time: 01/16/18  6:42 AM  Result Value Ref Range Status  MRSA by PCR NEGATIVE NEGATIVE Final    Comment:        The GeneXpert MRSA Assay (FDA approved for NASAL specimens only), is one component of a comprehensive MRSA colonization surveillance program. It is not intended to diagnose MRSA infection nor to guide or monitor treatment for MRSA infections. Performed at [facility], [place] [address] [place], [place] [address]       Radiology Studies: No results found.   Medications:  Scheduled: . apixaban  5 mg Oral BID  . dextrose  25 mL Intravenous Once  . docusate sodium  100 mg Oral BID  . gi cocktail  30 mL Oral Once  . lanthanum  1,000 mg Oral TID WC  . lanthanum  500 mg Oral TID WC  . lisinopril  5 mg Oral Daily  . pantoprazole  40 mg Oral Daily  . polyethylene glycol  17 g Oral Daily    Continuous: . dextrose 30 mL/hr at 01/16/18 1533   QBV:[id number], hydrOXYzine, morphine injection, oxyCODONE-acetaminophen, polyvinyl alcohol, traMADol  Assessment/Plan:    Small bowel obstruction Patient was admitted to the hospital.  NG tube was ordered with the patient refused.  Patient was seen by general surgery.  Patient underwent repeat films after he was given contrast.  Contrast was noted in the colon.  SBO appears to be improving patient is tolerating his diet.  No further interventions per general surgery.  Diet has been advanced.  Mobilize.    Hypoglycemia Secondary to poor oral intake due to SBO.  Diet being advanced.  End-stage renal disease on hemodialysis Nephrology is following.  He is on Monday Wednesday Friday schedule.  Due to staffing issues over the weekend he was dialyzed yesterday.  Plan is to repeat dialysis later today.    Right-sided pleural effusion Apparently he underwent thoracentesis at [facility] earlier this month and was found to have transudative effusion.  This is most likely due to noncompliance with dialysis treatments. He appears to have reaccumulated pleural fluid.  Thoracentesis has been ordered for today.   Anemia of chronic kidney disease Stable.  Essential hypertension Blood pressure noted to be elevated.  Continue home medications.  Continue to monitor closely.  Chronically elevated troponin No further work-up.  History of DVT and PE Apparently on Eliquis.  Previously had been on warfarin but was noncompliant.  Unclear if he is compliant with this medication.  Patient was started on IV heparin but he refused blood draws so heparin was discontinued.  He was started on prophylactic doses of Lovenox. No recent venous thromboembolism.  Venous Doppler study of his upper extremities in June showed chronic DVT.  Back in August 2018 he was found to have acute DVT in the right upper extremity.  Eliquis has been resumed.  History of  pancreatic pseudocyst He is status post cystogastrostomy with stent placement in March.  Apparently this was done at [facility].  It looks like the stent was removed on June 14 based on care everywhere notes  DVT Prophylaxis: Lovenox Code Status: Full code Family Communication: Discussed with the patient Disposition Plan: Continue to mobilize.  Thoracentesis today.  Then he will be dialyzed as well.  Plan on discharge tomorrow.    LOS: 3 days   [facility] Pager [phone] 01/19/2018, 9:07 AM  If 7PM-7AM, please contact night-coverage at [email or web address], password [facility]

## 2018-01-19 NOTE — Progress Notes (Signed)
Pt back from thoracentesis. Orders and plan for dialysis later today. Pt given prn pain medication per MAR.

## 2018-01-20 DIAGNOSIS — Z992 Dependence on renal dialysis: Secondary | ICD-10-CM | POA: Diagnosis not present

## 2018-01-20 DIAGNOSIS — N186 End stage renal disease: Secondary | ICD-10-CM | POA: Diagnosis not present

## 2018-01-20 DIAGNOSIS — T8610 Unspecified complication of kidney transplant: Secondary | ICD-10-CM | POA: Diagnosis not present

## 2018-01-20 IMAGING — CR DG CHEST 2V
2 series · 2 of 2 positions shown · non-contrast
Comparison: Radiograph dated 08/25/2015

CLINICAL DATA: 49-year-old male with shortness of breath and chest
pain

EXAM:
CHEST  2 VIEW

[chest pa]
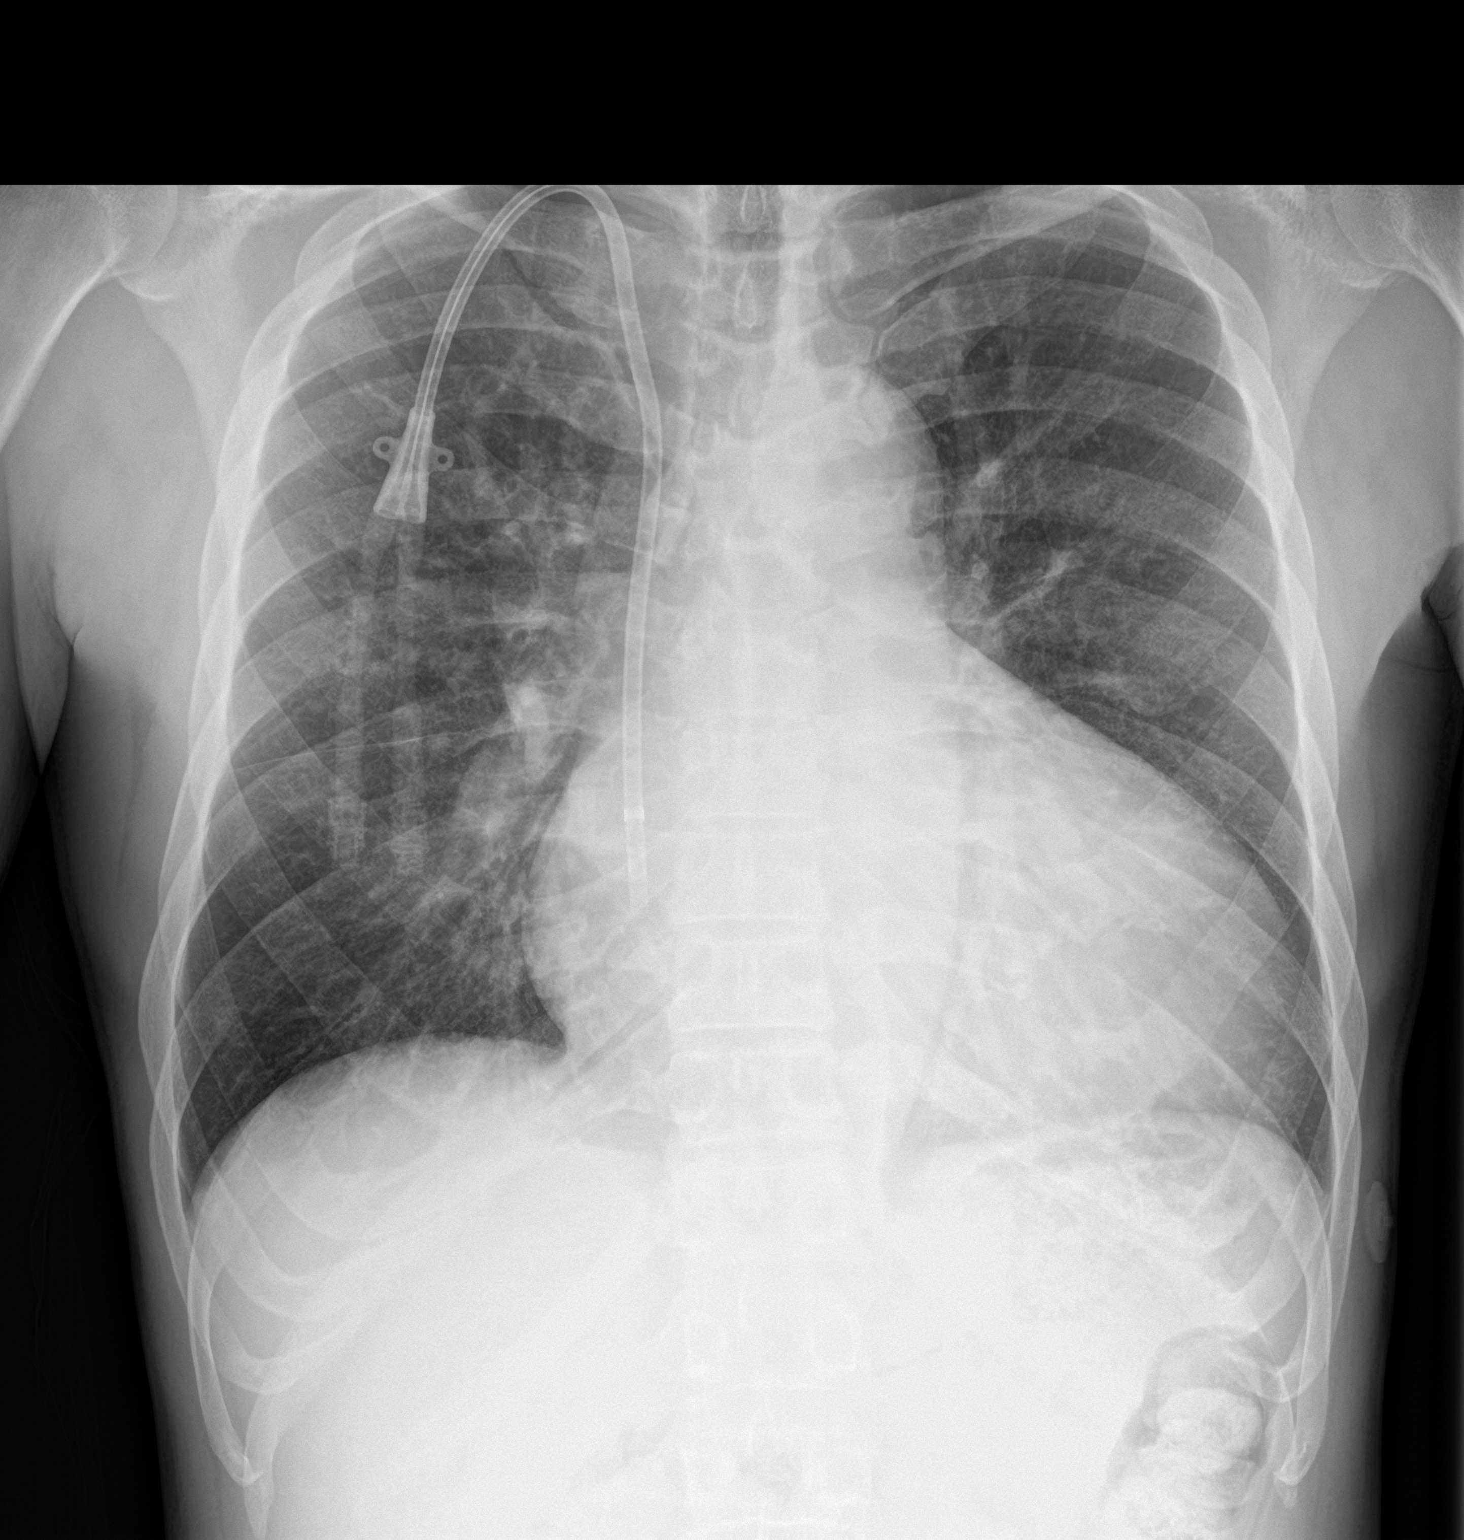

[chest lat]
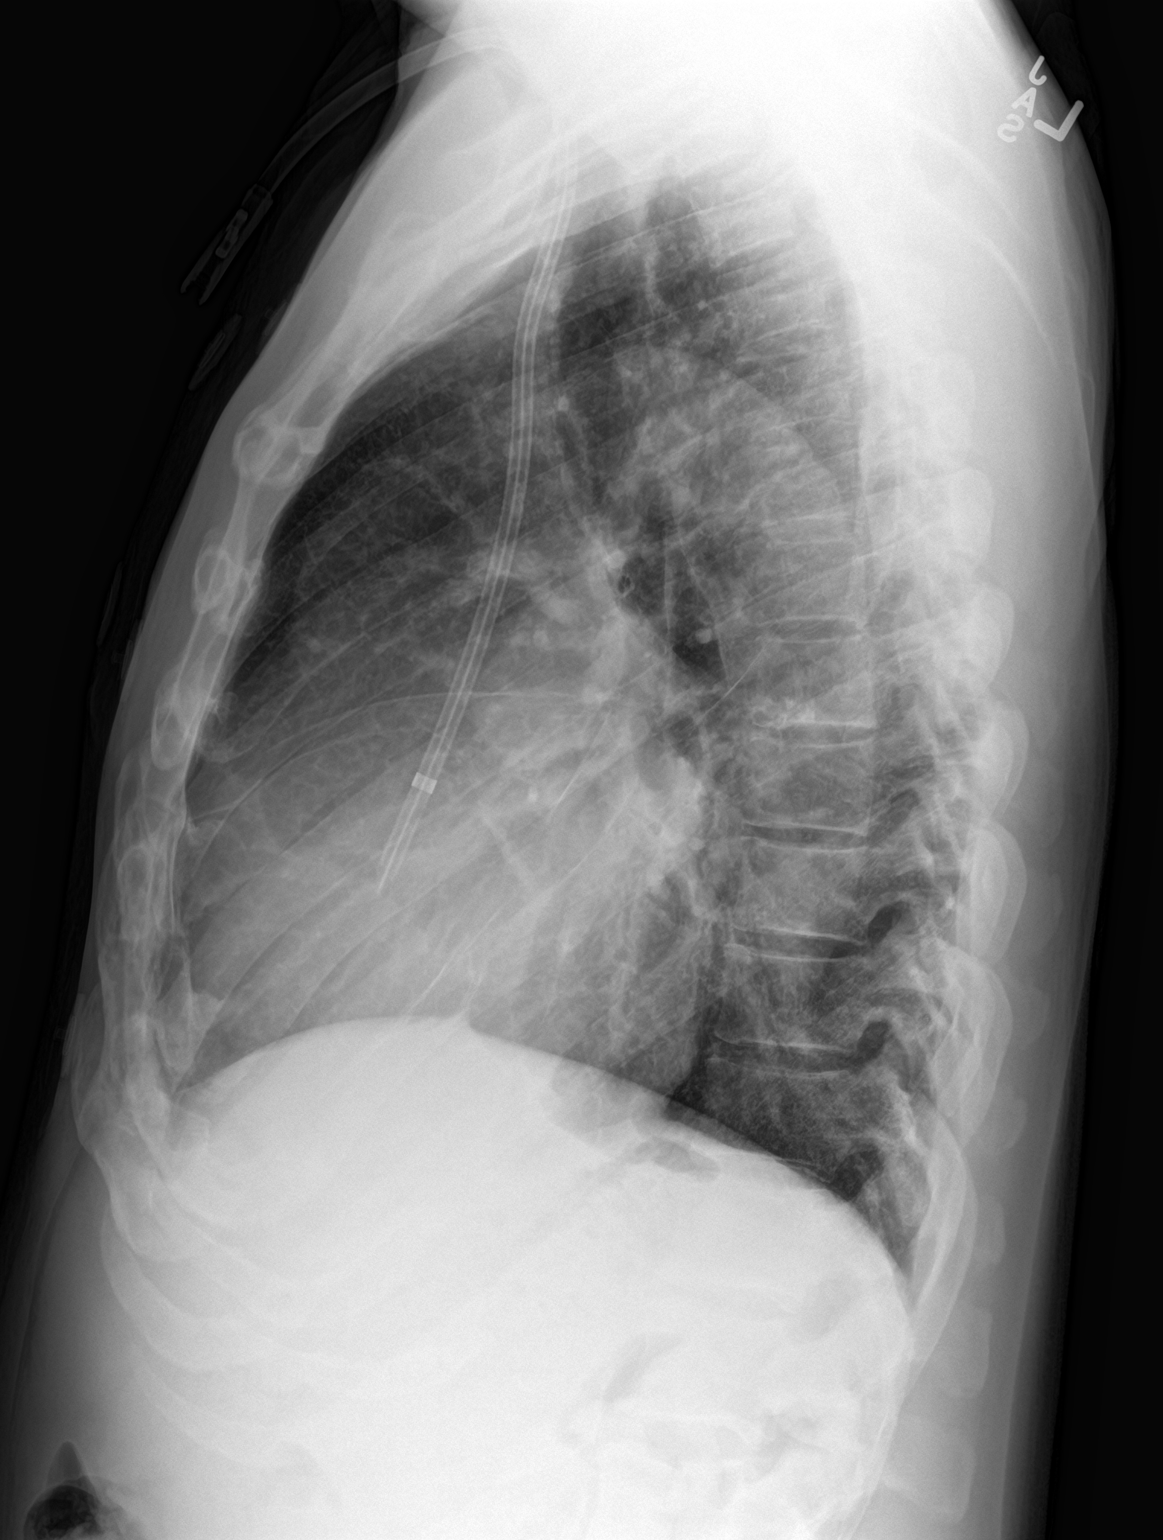

[2 of 2 positions shown; findings below may reference images not displayed]

FINDINGS: 78-year-old catheter is noted with tip over the right atrium in
stable positioning. There is stable cardiomegaly with mild vascular
congestion. No focal consolidation, pleural effusion, or
pneumothorax. No acute osseous pathology.
IMPRESSION: No interval change.

## 2018-01-20 NOTE — Care Management Important Message (Signed)
Important Message  Patient Details  Name: Frank [name] MRN: [id number] Date of Birth: 04/06/1966   Medicare Important Message Given:  Yes    [name] 01/20/2018, 8:35 AM

## 2018-01-21 DIAGNOSIS — N2581 Secondary hyperparathyroidism of renal origin: Secondary | ICD-10-CM | POA: Diagnosis not present

## 2018-01-21 DIAGNOSIS — N186 End stage renal disease: Secondary | ICD-10-CM | POA: Diagnosis not present

## 2018-01-21 DIAGNOSIS — D509 Iron deficiency anemia, unspecified: Secondary | ICD-10-CM | POA: Diagnosis not present

## 2018-01-21 DIAGNOSIS — Z992 Dependence on renal dialysis: Secondary | ICD-10-CM | POA: Diagnosis not present

## 2018-01-21 DIAGNOSIS — D631 Anemia in chronic kidney disease: Secondary | ICD-10-CM | POA: Diagnosis not present

## 2018-01-23 DIAGNOSIS — Z992 Dependence on renal dialysis: Secondary | ICD-10-CM | POA: Diagnosis not present

## 2018-01-23 DIAGNOSIS — D509 Iron deficiency anemia, unspecified: Secondary | ICD-10-CM | POA: Diagnosis not present

## 2018-01-23 DIAGNOSIS — D631 Anemia in chronic kidney disease: Secondary | ICD-10-CM | POA: Diagnosis not present

## 2018-01-23 DIAGNOSIS — N186 End stage renal disease: Secondary | ICD-10-CM | POA: Diagnosis not present

## 2018-01-23 DIAGNOSIS — N2581 Secondary hyperparathyroidism of renal origin: Secondary | ICD-10-CM | POA: Diagnosis not present

## 2018-01-25 ENCOUNTER — Non-Acute Institutional Stay (HOSPITAL_COMMUNITY)
Admission: EM | Admit: 2018-01-25 | Discharge: 2018-01-25 | Disposition: A | Discharge status: Home / Self Care | Payer: Medicare Other | Attending: Emergency Medicine | Admitting: Emergency Medicine

## 2018-01-25 ENCOUNTER — Emergency Department (HOSPITAL_COMMUNITY): Payer: Medicare Other

## 2018-01-25 ENCOUNTER — Encounter (HOSPITAL_COMMUNITY): Payer: Self-pay | Admitting: Emergency Medicine

## 2018-01-25 DIAGNOSIS — R1084 Generalized abdominal pain: Secondary | ICD-10-CM | POA: Diagnosis not present

## 2018-01-25 DIAGNOSIS — E785 Hyperlipidemia, unspecified: Secondary | ICD-10-CM | POA: Diagnosis not present

## 2018-01-25 DIAGNOSIS — N281 Cyst of kidney, acquired: Secondary | ICD-10-CM | POA: Diagnosis not present

## 2018-01-25 DIAGNOSIS — Z87891 Personal history of nicotine dependence: Secondary | ICD-10-CM | POA: Insufficient documentation

## 2018-01-25 DIAGNOSIS — F329 Major depressive disorder, single episode, unspecified: Secondary | ICD-10-CM | POA: Insufficient documentation

## 2018-01-25 DIAGNOSIS — K859 Acute pancreatitis without necrosis or infection, unspecified: Secondary | ICD-10-CM | POA: Diagnosis not present

## 2018-01-25 DIAGNOSIS — D631 Anemia in chronic kidney disease: Secondary | ICD-10-CM | POA: Insufficient documentation

## 2018-01-25 DIAGNOSIS — F419 Anxiety disorder, unspecified: Secondary | ICD-10-CM | POA: Diagnosis not present

## 2018-01-25 DIAGNOSIS — Z7901 Long term (current) use of anticoagulants: Secondary | ICD-10-CM | POA: Diagnosis not present

## 2018-01-25 DIAGNOSIS — Z992 Dependence on renal dialysis: Secondary | ICD-10-CM | POA: Diagnosis not present

## 2018-01-25 DIAGNOSIS — E875 Hyperkalemia: Secondary | ICD-10-CM | POA: Diagnosis present

## 2018-01-25 DIAGNOSIS — I2510 Atherosclerotic heart disease of native coronary artery without angina pectoris: Secondary | ICD-10-CM | POA: Diagnosis not present

## 2018-01-25 DIAGNOSIS — I700 Atherosclerosis of aorta: Secondary | ICD-10-CM | POA: Diagnosis present

## 2018-01-25 DIAGNOSIS — N186 End stage renal disease: Secondary | ICD-10-CM | POA: Diagnosis not present

## 2018-01-25 DIAGNOSIS — I132 Hypertensive heart and chronic kidney disease with heart failure and with stage 5 chronic kidney disease, or end stage renal disease: Secondary | ICD-10-CM | POA: Diagnosis not present

## 2018-01-25 DIAGNOSIS — K863 Pseudocyst of pancreas: Secondary | ICD-10-CM | POA: Insufficient documentation

## 2018-01-25 DIAGNOSIS — K861 Other chronic pancreatitis: Secondary | ICD-10-CM | POA: Insufficient documentation

## 2018-01-25 DIAGNOSIS — R188 Other ascites: Secondary | ICD-10-CM | POA: Insufficient documentation

## 2018-01-25 DIAGNOSIS — N19 Unspecified kidney failure: Secondary | ICD-10-CM | POA: Diagnosis not present

## 2018-01-25 DIAGNOSIS — N2889 Other specified disorders of kidney and ureter: Secondary | ICD-10-CM | POA: Insufficient documentation

## 2018-01-25 DIAGNOSIS — K7460 Unspecified cirrhosis of liver: Secondary | ICD-10-CM | POA: Insufficient documentation

## 2018-01-25 DIAGNOSIS — J90 Pleural effusion, not elsewhere classified: Secondary | ICD-10-CM | POA: Insufficient documentation

## 2018-01-25 DIAGNOSIS — E1122 Type 2 diabetes mellitus with diabetic chronic kidney disease: Secondary | ICD-10-CM | POA: Insufficient documentation

## 2018-01-25 DIAGNOSIS — Z79899 Other long term (current) drug therapy: Secondary | ICD-10-CM | POA: Insufficient documentation

## 2018-01-25 DIAGNOSIS — Z86718 Personal history of other venous thrombosis and embolism: Secondary | ICD-10-CM | POA: Diagnosis not present

## 2018-01-25 DIAGNOSIS — R109 Unspecified abdominal pain: Secondary | ICD-10-CM

## 2018-01-25 DIAGNOSIS — K56609 Unspecified intestinal obstruction, unspecified as to partial versus complete obstruction: Secondary | ICD-10-CM | POA: Diagnosis not present

## 2018-01-25 DIAGNOSIS — I5042 Chronic combined systolic (congestive) and diastolic (congestive) heart failure: Secondary | ICD-10-CM | POA: Insufficient documentation

## 2018-01-25 DIAGNOSIS — K219 Gastro-esophageal reflux disease without esophagitis: Secondary | ICD-10-CM | POA: Insufficient documentation

## 2018-01-25 DIAGNOSIS — Z940 Kidney transplant status: Secondary | ICD-10-CM | POA: Insufficient documentation

## 2018-01-25 DIAGNOSIS — R0602 Shortness of breath: Secondary | ICD-10-CM | POA: Diagnosis not present

## 2018-01-25 DIAGNOSIS — R1110 Vomiting, unspecified: Secondary | ICD-10-CM | POA: Diagnosis not present

## 2018-01-25 DIAGNOSIS — Z886 Allergy status to analgesic agent status: Secondary | ICD-10-CM | POA: Insufficient documentation

## 2018-01-25 LAB — CBC
HCT: 37.2 % — ABNORMAL LOW (ref 39.0–52.0)
HEMOGLOBIN: 11.3 g/dL — AB (ref 13.0–17.0)
MCH: 26.3 pg (ref 26.0–34.0)
MCHC: 30.4 g/dL (ref 30.0–36.0)
MCV: 86.7 fL (ref 78.0–100.0)
Platelets: 248 10*3/uL (ref 150–400)
RBC: 4.29 MIL/uL (ref 4.22–5.81)
RDW: 17.6 % — ABNORMAL HIGH (ref 11.5–15.5)
WBC: 4.2 10*3/uL (ref 4.0–10.5)

## 2018-01-25 LAB — COMPREHENSIVE METABOLIC PANEL
ALK PHOS: 78 U/L (ref 38–126)
ALT: 9 U/L (ref 0–44)
ANION GAP: 15 (ref 5–15)
AST: 44 U/L — ABNORMAL HIGH (ref 15–41)
Albumin: 3.2 g/dL — ABNORMAL LOW (ref 3.5–5.0)
BUN: 44 mg/dL — ABNORMAL HIGH (ref 6–20)
CALCIUM: 9.7 mg/dL (ref 8.9–10.3)
CO2: 26 mmol/L (ref 22–32)
Chloride: 97 mmol/L — ABNORMAL LOW (ref 98–111)
Creatinine, Ser: 11.17 mg/dL — ABNORMAL HIGH (ref 0.61–1.24)
GFR, EST AFRICAN AMERICAN: 5 mL/min — AB (ref >60)
GFR, EST NON AFRICAN AMERICAN: 5 mL/min — AB (ref >60)
Glucose, Bld: 80 mg/dL (ref 70–99)
Potassium: 6 mmol/L — ABNORMAL HIGH (ref 3.5–5.1)
SODIUM: 138 mmol/L (ref 135–145)
Total Bilirubin: 1.7 mg/dL — ABNORMAL HIGH (ref 0.3–1.2)
Total Protein: 8.4 g/dL — ABNORMAL HIGH (ref 6.5–8.1)

## 2018-01-25 LAB — LIPASE, BLOOD: LIPASE: 22 U/L (ref 11–51)

## 2018-01-25 LAB — GLUCOSE, CAPILLARY: GLUCOSE-CAPILLARY: 71 mg/dL (ref 70–99)

## 2018-01-25 MED ORDER — SODIUM POLYSTYRENE SULFONATE 15 GM/60ML PO SUSP: 15.0000 g | Freq: Once | ORAL | Status: DC

## 2018-01-25 MED ORDER — PENTAFLUOROPROP-TETRAFLUOROETH EX AERO: 1.0000 "application " | INHALATION_SPRAY | CUTANEOUS | Status: DC | PRN

## 2018-01-25 MED ORDER — FENTANYL CITRATE (PF) 100 MCG/2ML IJ SOLN
50.0000 ug | Freq: Once | INTRAMUSCULAR | Status: AC
  Administered 2018-01-25: 50 ug via INTRAVENOUS
  Filled 2018-01-25: qty 2

## 2018-01-25 MED ORDER — PROMETHAZINE HCL 25 MG/ML IJ SOLN
12.5000 mg | Freq: Once | INTRAMUSCULAR | Status: AC
  Administered 2018-01-25: 12.5 mg via INTRAVENOUS
  Filled 2018-01-25: qty 1

## 2018-01-25 MED ORDER — SODIUM CHLORIDE 0.9 % IV SOLN: INTRAVENOUS | Status: DC

## 2018-01-25 MED ORDER — CHLORHEXIDINE GLUCONATE CLOTH 2 % EX PADS: 6.0000 | MEDICATED_PAD | Freq: Every day | CUTANEOUS | Status: DC

## 2018-01-25 MED ORDER — LIDOCAINE HCL (PF) 1 % IJ SOLN: 5.0000 mL | INTRAMUSCULAR | Status: DC | PRN

## 2018-01-25 MED ORDER — FUROSEMIDE 10 MG/ML IJ SOLN: 40.0000 mg | Freq: Once | INTRAMUSCULAR | Status: DC

## 2018-01-25 MED ORDER — FENTANYL CITRATE (PF) 100 MCG/2ML IJ SOLN: 50.0000 ug | Freq: Once | INTRAMUSCULAR | Status: DC

## 2018-01-25 MED ORDER — SODIUM CHLORIDE 0.9 % IV SOLN: 100.0000 mL | INTRAVENOUS | Status: DC | PRN

## 2018-01-25 MED ORDER — LIDOCAINE-PRILOCAINE 2.5-2.5 % EX CREA: 1.0000 "application " | TOPICAL_CREAM | CUTANEOUS | Status: DC | PRN

## 2018-01-25 NOTE — ED Triage Notes (Signed)
Pt c/o abd pain, cramping, emesis x 6-7, pt states pain similar to pain he had last week with SBO. Pt states he was unable to complete HD d/t pain Friday, pt reports increase in shob.

## 2018-01-25 NOTE — Discharge Instructions (Addendum)
Please follow up with your doctor for thoracentesis and further work up of left kidney mass.

## 2018-01-25 NOTE — ED Notes (Signed)
IV team at bedside.
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
## 2018-01-25 NOTE — ED Notes (Signed)
Patient repeatedly pacing in the hallway. Redirected into room and explained delay on medications while waiting for IV team.

## 2018-01-25 NOTE — ED Notes (Signed)
Patient requesting pain medication. MD notified.

## 2018-01-25 NOTE — ED Notes (Signed)
Patient refuses to keep pulse ox monitor on. Will not allow staff to place cardiac monitor on.

## 2018-01-25 NOTE — ED Notes (Signed)
Attempted IV access x1 without success

## 2018-01-25 NOTE — ED Notes (Signed)
Patient informed needing urine sample. Patient states he does not void.

## 2018-01-25 NOTE — ED Notes (Signed)
Pt upset he is not being straight to a room stating "I was here before that lady" pt advised we are unable to discuss other patients care

## 2018-01-25 NOTE — Progress Notes (Signed)
Patient discussed with Dr. Jeanell Sparrow.  Patient was seen at dialysis 7/5 and after 1 hr 24 min due to abdominal pain .  K 6.hgb 11.3  Abdominal pain today.  Recent admission for SBO - none today on CT. Pain meds given.  BP elevated in usual range. Needs volume down.  Plan 3 hour HD today e to temporize, then discharge and follow up at his usual HD treatment on Monday.  Amalia Hailey, PA-C

## 2018-01-25 NOTE — Progress Notes (Signed)
Pt completed HD tx and discharged home per MD order in stable condition. Pt was transported to the ED waiting room.

## 2018-01-27 IMAGING — DX DG CHEST 2V
2 series · 2 of 2 positions shown · non-contrast
Comparison: 09/01/2015

CLINICAL DATA: Chest pain radiating to the left arm. Shortness of
breath. Nausea and vomiting beginning tonight. Cough and congestion.

EXAM:
CHEST  2 VIEW

[w chest lat]
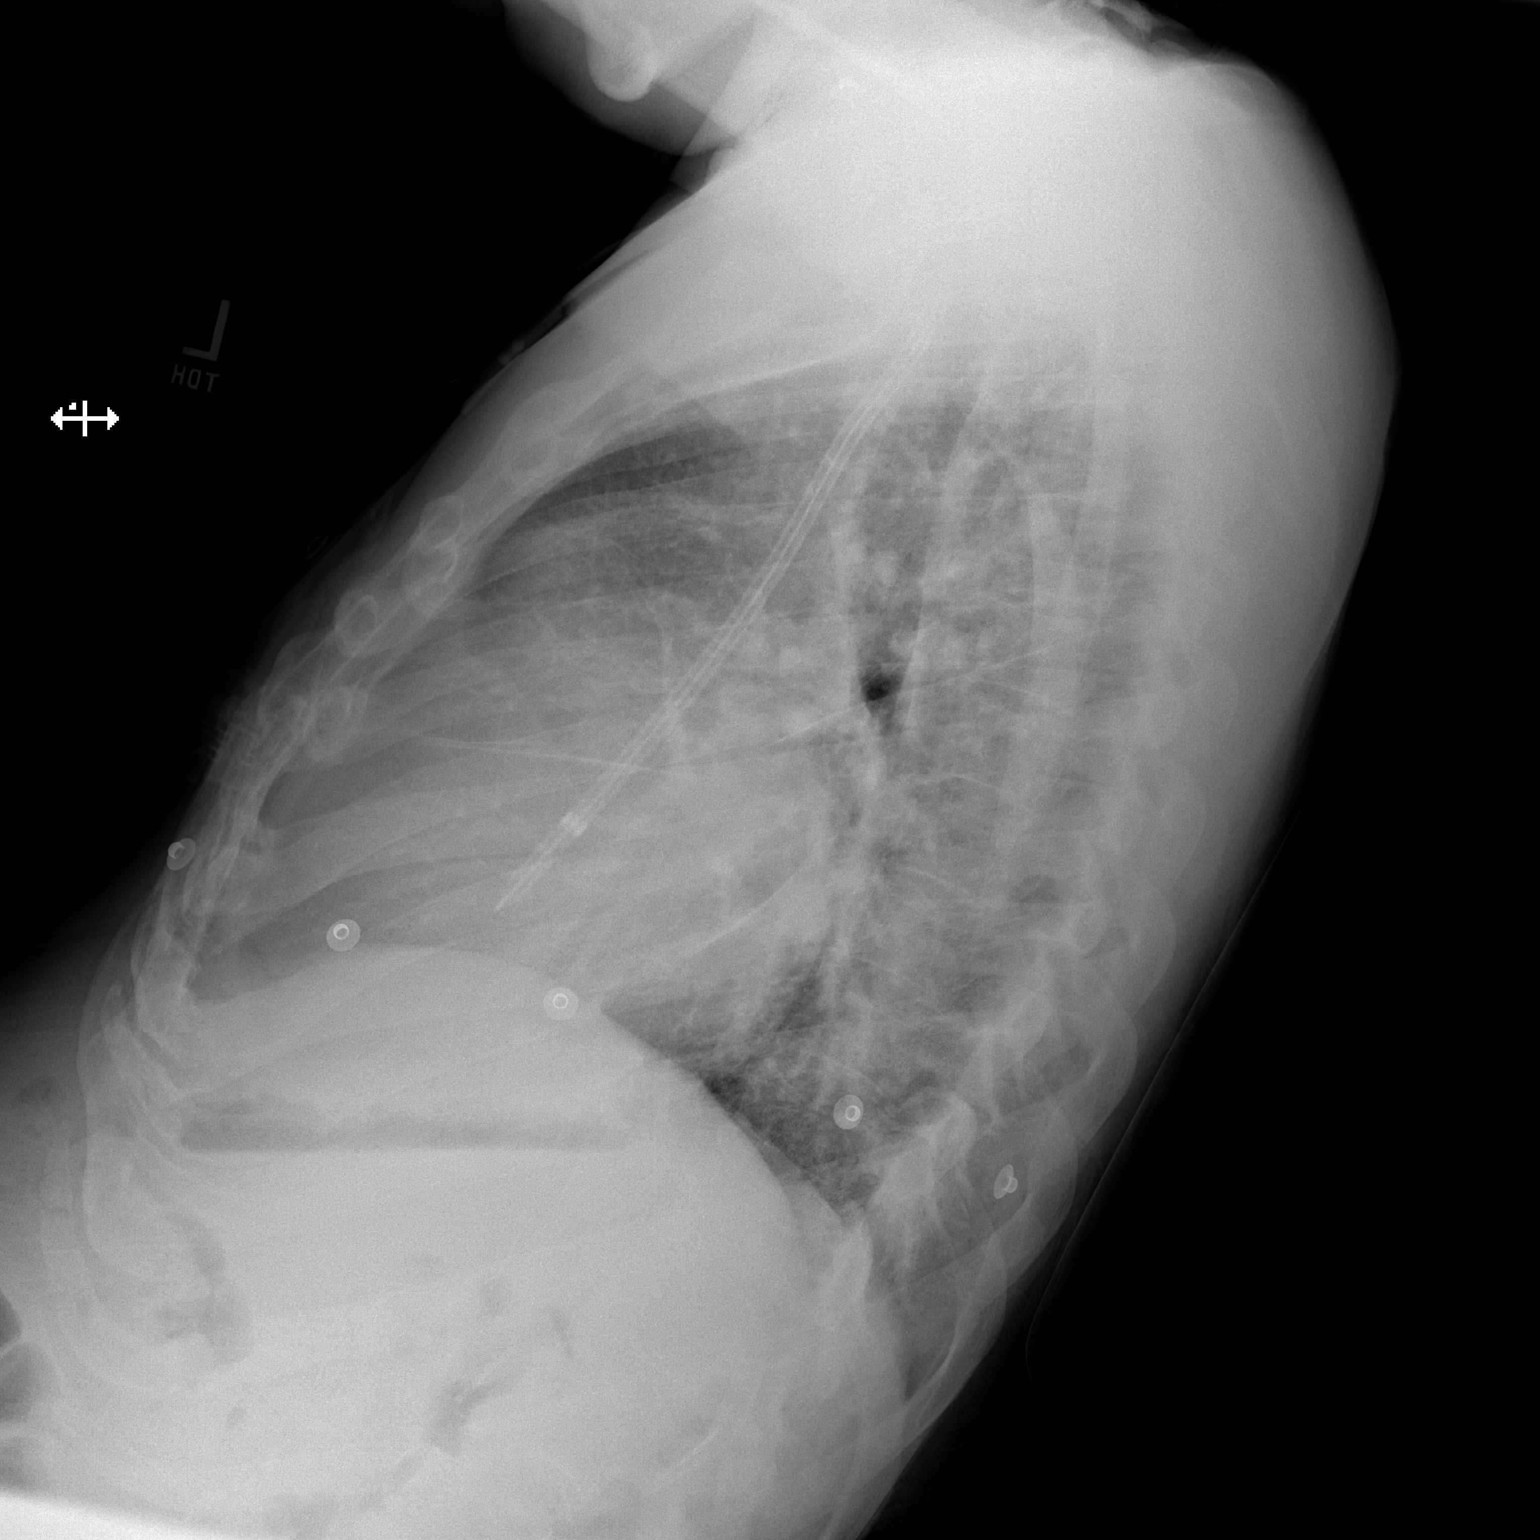

[x chest ap]
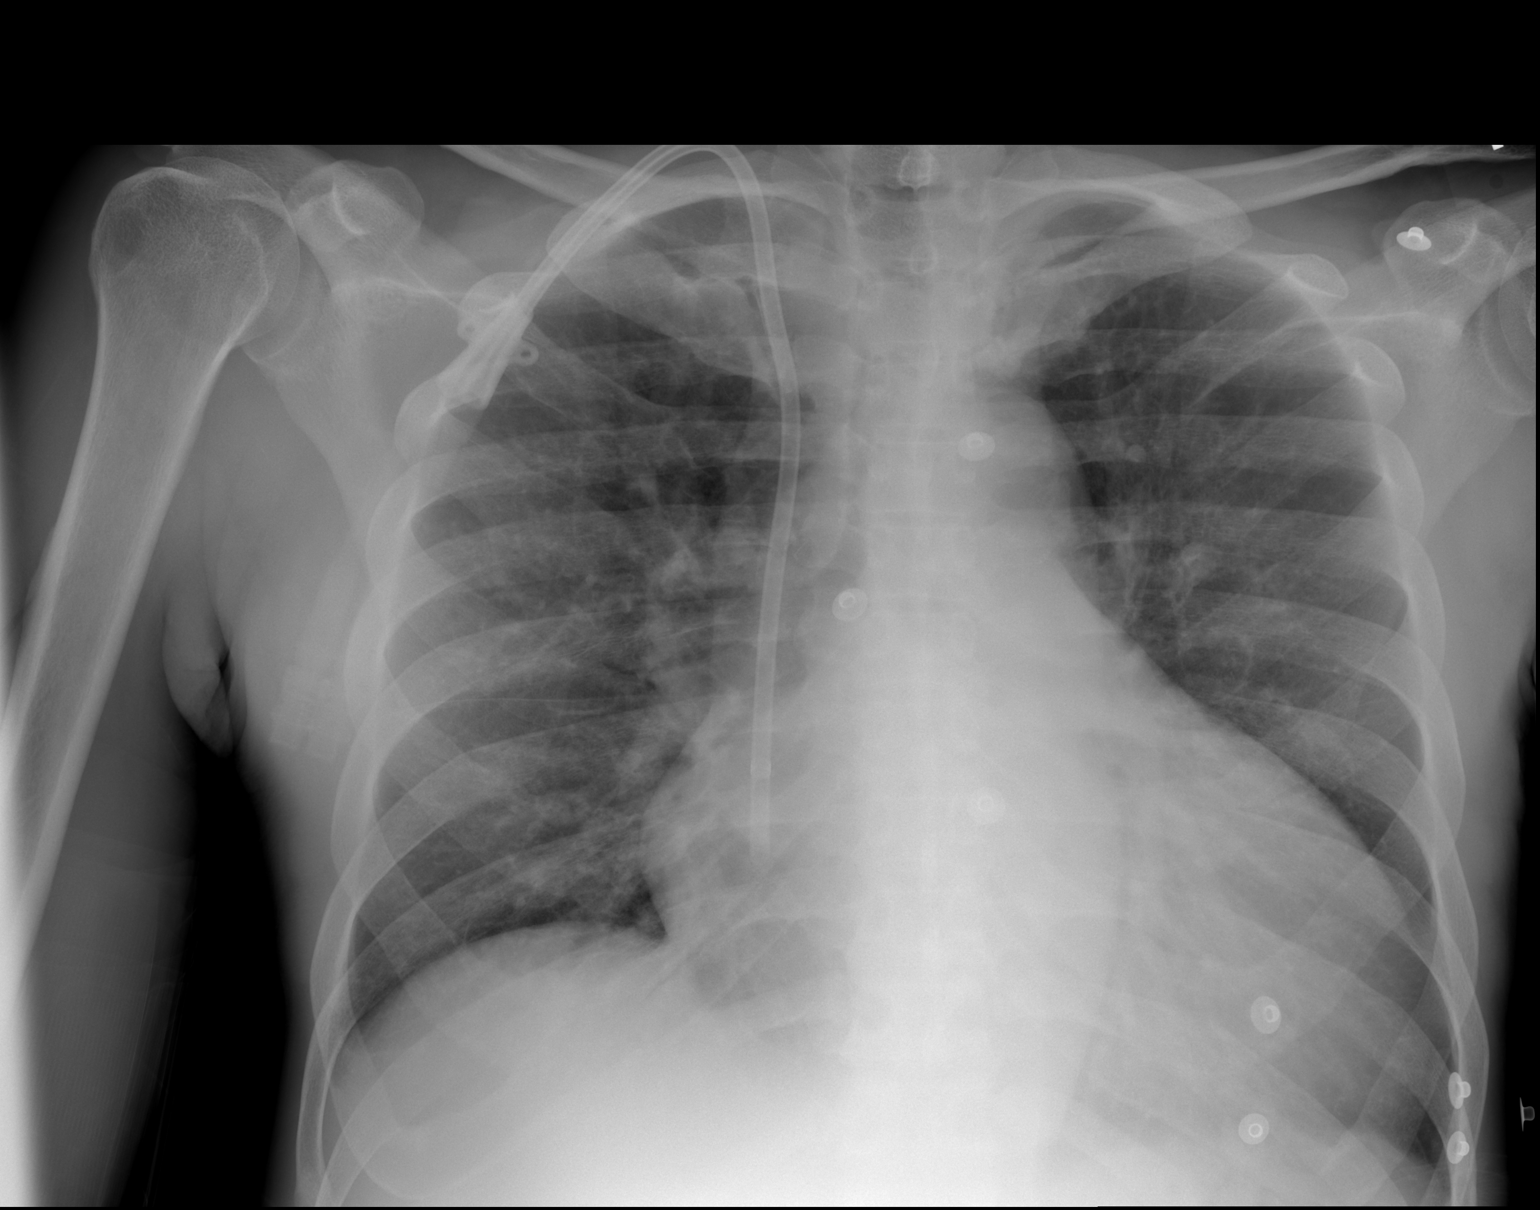

[2 of 2 positions shown; findings below may reference images not displayed]

FINDINGS: Right central venous catheter with tip over the cavoatrial
junction/upper RA. Diffuse cardiac enlargement. Mild pulmonary
vascular congestion. No edema or consolidation. No blunting of
costophrenic angles. No pneumothorax.
IMPRESSION: Cardiac enlargement with mild pulmonary vascular congestion
suggesting fluid overload. No edema or consolidation.

## 2018-01-28 ENCOUNTER — Other Ambulatory Visit: Payer: Self-pay

## 2018-01-28 ENCOUNTER — Emergency Department (HOSPITAL_COMMUNITY): Payer: Medicare Other

## 2018-01-28 ENCOUNTER — Emergency Department (HOSPITAL_COMMUNITY)
Admission: EM | Admit: 2018-01-28 | Discharge: 2018-01-28 | Disposition: A | Discharge status: Home / Self Care | Payer: Medicare Other | Attending: Emergency Medicine | Admitting: Emergency Medicine

## 2018-01-28 ENCOUNTER — Encounter (HOSPITAL_COMMUNITY): Payer: Self-pay | Admitting: *Deleted

## 2018-01-28 DIAGNOSIS — I132 Hypertensive heart and chronic kidney disease with heart failure and with stage 5 chronic kidney disease, or end stage renal disease: Secondary | ICD-10-CM | POA: Diagnosis not present

## 2018-01-28 DIAGNOSIS — G8929 Other chronic pain: Secondary | ICD-10-CM | POA: Insufficient documentation

## 2018-01-28 DIAGNOSIS — E1122 Type 2 diabetes mellitus with diabetic chronic kidney disease: Secondary | ICD-10-CM | POA: Diagnosis not present

## 2018-01-28 DIAGNOSIS — Z7901 Long term (current) use of anticoagulants: Secondary | ICD-10-CM | POA: Diagnosis not present

## 2018-01-28 DIAGNOSIS — Z79899 Other long term (current) drug therapy: Secondary | ICD-10-CM | POA: Diagnosis not present

## 2018-01-28 DIAGNOSIS — F1210 Cannabis abuse, uncomplicated: Secondary | ICD-10-CM | POA: Diagnosis not present

## 2018-01-28 DIAGNOSIS — R109 Unspecified abdominal pain: Secondary | ICD-10-CM | POA: Diagnosis not present

## 2018-01-28 DIAGNOSIS — I5042 Chronic combined systolic (congestive) and diastolic (congestive) heart failure: Secondary | ICD-10-CM | POA: Insufficient documentation

## 2018-01-28 DIAGNOSIS — Z87891 Personal history of nicotine dependence: Secondary | ICD-10-CM | POA: Insufficient documentation

## 2018-01-28 DIAGNOSIS — N186 End stage renal disease: Secondary | ICD-10-CM | POA: Insufficient documentation

## 2018-01-28 DIAGNOSIS — R52 Pain, unspecified: Secondary | ICD-10-CM | POA: Diagnosis not present

## 2018-01-28 DIAGNOSIS — Z992 Dependence on renal dialysis: Secondary | ICD-10-CM | POA: Insufficient documentation

## 2018-01-28 DIAGNOSIS — R110 Nausea: Secondary | ICD-10-CM | POA: Diagnosis not present

## 2018-01-28 DIAGNOSIS — I10 Essential (primary) hypertension: Secondary | ICD-10-CM | POA: Diagnosis not present

## 2018-01-28 DIAGNOSIS — R1084 Generalized abdominal pain: Secondary | ICD-10-CM | POA: Diagnosis not present

## 2018-01-28 LAB — COMPREHENSIVE METABOLIC PANEL
ALT: 9 U/L (ref 0–44)
AST: 24 U/L (ref 15–41)
Albumin: 3 g/dL — ABNORMAL LOW (ref 3.5–5.0)
Alkaline Phosphatase: 120 U/L (ref 38–126)
Anion gap: 16 — ABNORMAL HIGH (ref 5–15)
BUN: 44 mg/dL — ABNORMAL HIGH (ref 6–20)
CHLORIDE: 99 mmol/L (ref 98–111)
CO2: 24 mmol/L (ref 22–32)
Calcium: 9.4 mg/dL (ref 8.9–10.3)
Creatinine, Ser: 11.53 mg/dL — ABNORMAL HIGH (ref 0.61–1.24)
GFR, EST AFRICAN AMERICAN: 5 mL/min — AB (ref >60)
GFR, EST NON AFRICAN AMERICAN: 4 mL/min — AB (ref >60)
Glucose, Bld: 88 mg/dL (ref 70–99)
POTASSIUM: 4.2 mmol/L (ref 3.5–5.1)
SODIUM: 139 mmol/L (ref 135–145)
Total Bilirubin: 0.9 mg/dL (ref 0.3–1.2)
Total Protein: 8 g/dL (ref 6.5–8.1)

## 2018-01-28 LAB — CBC
HEMATOCRIT: 36.8 % — AB (ref 39.0–52.0)
Hemoglobin: 11.1 g/dL — ABNORMAL LOW (ref 13.0–17.0)
MCH: 26.1 pg (ref 26.0–34.0)
MCHC: 30.2 g/dL (ref 30.0–36.0)
MCV: 86.4 fL (ref 78.0–100.0)
Platelets: 199 10*3/uL (ref 150–400)
RBC: 4.26 MIL/uL (ref 4.22–5.81)
RDW: 17.7 % — ABNORMAL HIGH (ref 11.5–15.5)
WBC: 4.3 10*3/uL (ref 4.0–10.5)

## 2018-01-28 LAB — LIPASE, BLOOD: LIPASE: 88 U/L — AB (ref 11–51)

## 2018-01-28 MED ORDER — HYDROMORPHONE HCL 1 MG/ML IJ SOLN
1.0000 mg | Freq: Once | INTRAMUSCULAR | Status: AC
  Administered 2018-01-28: 1 mg via INTRAVENOUS
  Filled 2018-01-28: qty 1

## 2018-01-28 MED ORDER — HYDROCODONE-ACETAMINOPHEN 5-325 MG PO TABS
ORAL_TABLET | ORAL | Status: AC
  Filled 2018-01-28: qty 2

## 2018-01-28 MED ORDER — OXYCODONE-ACETAMINOPHEN 5-325 MG PO TABS: 2.0000 | ORAL_TABLET | Freq: Once | ORAL | Status: DC

## 2018-01-28 MED ORDER — PROMETHAZINE HCL 25 MG/ML IJ SOLN
25.0000 mg | Freq: Once | INTRAMUSCULAR | Status: AC
  Administered 2018-01-28: 25 mg via INTRAVENOUS
  Filled 2018-01-28: qty 1

## 2018-01-28 NOTE — ED Notes (Signed)
Pt calling out stating he needs something for pain. Pt reports he threw up on the floor. EDP notified. Pt notified status remains discharge. Pt becoming verbally aggressive.

## 2018-01-28 NOTE — ED Triage Notes (Signed)
C/o abd. Pain onset yest c/o nausea and vomiting . States he had dialysis Sunday and Monday
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
## 2018-01-29 DIAGNOSIS — N186 End stage renal disease: Secondary | ICD-10-CM | POA: Diagnosis not present

## 2018-01-29 DIAGNOSIS — N2581 Secondary hyperparathyroidism of renal origin: Secondary | ICD-10-CM | POA: Diagnosis not present

## 2018-01-29 DIAGNOSIS — Z992 Dependence on renal dialysis: Secondary | ICD-10-CM | POA: Diagnosis not present

## 2018-01-29 DIAGNOSIS — D631 Anemia in chronic kidney disease: Secondary | ICD-10-CM | POA: Diagnosis not present

## 2018-01-29 DIAGNOSIS — D509 Iron deficiency anemia, unspecified: Secondary | ICD-10-CM | POA: Diagnosis not present

## 2018-01-30 DIAGNOSIS — N186 End stage renal disease: Secondary | ICD-10-CM | POA: Diagnosis not present

## 2018-01-30 DIAGNOSIS — D509 Iron deficiency anemia, unspecified: Secondary | ICD-10-CM | POA: Diagnosis not present

## 2018-01-30 DIAGNOSIS — D631 Anemia in chronic kidney disease: Secondary | ICD-10-CM | POA: Diagnosis not present

## 2018-01-30 DIAGNOSIS — N2581 Secondary hyperparathyroidism of renal origin: Secondary | ICD-10-CM | POA: Diagnosis not present

## 2018-01-30 DIAGNOSIS — Z992 Dependence on renal dialysis: Secondary | ICD-10-CM | POA: Diagnosis not present

## 2018-01-31 ENCOUNTER — Encounter (HOSPITAL_COMMUNITY): Payer: Self-pay | Admitting: Emergency Medicine

## 2018-01-31 ENCOUNTER — Inpatient Hospital Stay (HOSPITAL_COMMUNITY)
Admission: EM | Admit: 2018-01-31 | Discharge: 2018-02-06 | DRG: 186 | Disposition: A | Discharge status: Home / Self Care | Payer: Medicare Other | Attending: Family Medicine | Admitting: Family Medicine

## 2018-01-31 ENCOUNTER — Emergency Department (HOSPITAL_COMMUNITY): Payer: Medicare Other

## 2018-01-31 ENCOUNTER — Other Ambulatory Visit: Payer: Self-pay

## 2018-01-31 DIAGNOSIS — Z86718 Personal history of other venous thrombosis and embolism: Secondary | ICD-10-CM

## 2018-01-31 DIAGNOSIS — I10 Essential (primary) hypertension: Secondary | ICD-10-CM | POA: Diagnosis present

## 2018-01-31 DIAGNOSIS — J948 Other specified pleural conditions: Secondary | ICD-10-CM

## 2018-01-31 DIAGNOSIS — K861 Other chronic pancreatitis: Secondary | ICD-10-CM | POA: Diagnosis not present

## 2018-01-31 DIAGNOSIS — N189 Chronic kidney disease, unspecified: Secondary | ICD-10-CM

## 2018-01-31 DIAGNOSIS — I120 Hypertensive chronic kidney disease with stage 5 chronic kidney disease or end stage renal disease: Secondary | ICD-10-CM | POA: Diagnosis not present

## 2018-01-31 DIAGNOSIS — I5042 Chronic combined systolic (congestive) and diastolic (congestive) heart failure: Secondary | ICD-10-CM | POA: Diagnosis present

## 2018-01-31 DIAGNOSIS — Z79891 Long term (current) use of opiate analgesic: Secondary | ICD-10-CM

## 2018-01-31 DIAGNOSIS — E1129 Type 2 diabetes mellitus with other diabetic kidney complication: Secondary | ICD-10-CM | POA: Diagnosis present

## 2018-01-31 DIAGNOSIS — R112 Nausea with vomiting, unspecified: Secondary | ICD-10-CM | POA: Diagnosis not present

## 2018-01-31 DIAGNOSIS — E1159 Type 2 diabetes mellitus with other circulatory complications: Secondary | ICD-10-CM | POA: Diagnosis present

## 2018-01-31 DIAGNOSIS — IMO0002 Reserved for concepts with insufficient information to code with codable children: Secondary | ICD-10-CM | POA: Diagnosis present

## 2018-01-31 DIAGNOSIS — N2889 Other specified disorders of kidney and ureter: Secondary | ICD-10-CM | POA: Diagnosis present

## 2018-01-31 DIAGNOSIS — N186 End stage renal disease: Secondary | ICD-10-CM | POA: Diagnosis not present

## 2018-01-31 DIAGNOSIS — J810 Acute pulmonary edema: Secondary | ICD-10-CM

## 2018-01-31 DIAGNOSIS — E1165 Type 2 diabetes mellitus with hyperglycemia: Secondary | ICD-10-CM

## 2018-01-31 DIAGNOSIS — D696 Thrombocytopenia, unspecified: Secondary | ICD-10-CM | POA: Diagnosis present

## 2018-01-31 DIAGNOSIS — J90 Pleural effusion, not elsewhere classified: Secondary | ICD-10-CM

## 2018-01-31 DIAGNOSIS — R1110 Vomiting, unspecified: Secondary | ICD-10-CM | POA: Diagnosis not present

## 2018-01-31 DIAGNOSIS — I132 Hypertensive heart and chronic kidney disease with heart failure and with stage 5 chronic kidney disease, or end stage renal disease: Secondary | ICD-10-CM | POA: Diagnosis not present

## 2018-01-31 DIAGNOSIS — Z886 Allergy status to analgesic agent status: Secondary | ICD-10-CM

## 2018-01-31 DIAGNOSIS — E1122 Type 2 diabetes mellitus with diabetic chronic kidney disease: Secondary | ICD-10-CM | POA: Diagnosis present

## 2018-01-31 DIAGNOSIS — R0902 Hypoxemia: Secondary | ICD-10-CM | POA: Diagnosis not present

## 2018-01-31 DIAGNOSIS — Z992 Dependence on renal dialysis: Secondary | ICD-10-CM

## 2018-01-31 DIAGNOSIS — R197 Diarrhea, unspecified: Secondary | ICD-10-CM

## 2018-01-31 DIAGNOSIS — Z86711 Personal history of pulmonary embolism: Secondary | ICD-10-CM

## 2018-01-31 DIAGNOSIS — Z87891 Personal history of nicotine dependence: Secondary | ICD-10-CM

## 2018-01-31 DIAGNOSIS — Z9119 Patient's noncompliance with other medical treatment and regimen: Secondary | ICD-10-CM

## 2018-01-31 DIAGNOSIS — Z79899 Other long term (current) drug therapy: Secondary | ICD-10-CM

## 2018-01-31 DIAGNOSIS — E875 Hyperkalemia: Secondary | ICD-10-CM | POA: Diagnosis present

## 2018-01-31 DIAGNOSIS — K219 Gastro-esophageal reflux disease without esophagitis: Secondary | ICD-10-CM | POA: Diagnosis present

## 2018-01-31 DIAGNOSIS — Z885 Allergy status to narcotic agent status: Secondary | ICD-10-CM

## 2018-01-31 DIAGNOSIS — Z888 Allergy status to other drugs, medicaments and biological substances status: Secondary | ICD-10-CM

## 2018-01-31 DIAGNOSIS — Z7901 Long term (current) use of anticoagulants: Secondary | ICD-10-CM

## 2018-01-31 DIAGNOSIS — D631 Anemia in chronic kidney disease: Secondary | ICD-10-CM | POA: Diagnosis present

## 2018-01-31 DIAGNOSIS — I152 Hypertension secondary to endocrine disorders: Secondary | ICD-10-CM | POA: Diagnosis present

## 2018-01-31 DIAGNOSIS — I428 Other cardiomyopathies: Secondary | ICD-10-CM | POA: Diagnosis present

## 2018-01-31 HISTORY — DX: Acute pancreatitis without necrosis or infection, unspecified: K85.90

## 2018-01-31 HISTORY — DX: Other specified pleural conditions: J94.8

## 2018-01-31 HISTORY — DX: Other specified anxiety disorders: F41.8

## 2018-01-31 HISTORY — DX: Other chronic pancreatitis: K86.1

## 2018-01-31 HISTORY — DX: Hypoxemia: R09.02

## 2018-01-31 HISTORY — DX: Pleural effusion, not elsewhere classified: J90

## 2018-01-31 LAB — CBC
HCT: 35.7 % — ABNORMAL LOW (ref 39.0–52.0)
HEMATOCRIT: 36.9 % — AB (ref 39.0–52.0)
HEMOGLOBIN: 11.3 g/dL — AB (ref 13.0–17.0)
Hemoglobin: 11.1 g/dL — ABNORMAL LOW (ref 13.0–17.0)
MCH: 26.4 pg (ref 26.0–34.0)
MCH: 26.6 pg (ref 26.0–34.0)
MCHC: 30.6 g/dL (ref 30.0–36.0)
MCHC: 31.1 g/dL (ref 30.0–36.0)
MCV: 86.2 fL (ref 78.0–100.0)
MCV: 85.4 fL (ref 78.0–100.0)
PLATELETS: 126 10*3/uL — AB (ref 150–400)
Platelets: 131 10*3/uL — ABNORMAL LOW (ref 150–400)
RBC: 4.28 MIL/uL (ref 4.22–5.81)
RBC: 4.18 MIL/uL — AB (ref 4.22–5.81)
RDW: 18.1 % — ABNORMAL HIGH (ref 11.5–15.5)
RDW: 17.7 % — ABNORMAL HIGH (ref 11.5–15.5)
WBC: 3.6 10*3/uL — AB (ref 4.0–10.5)
WBC: 3.4 10*3/uL — ABNORMAL LOW (ref 4.0–10.5)

## 2018-01-31 LAB — COMPREHENSIVE METABOLIC PANEL
ALT: 11 U/L (ref 0–44)
AST: 24 U/L (ref 15–41)
Albumin: 3 g/dL — ABNORMAL LOW (ref 3.5–5.0)
Alkaline Phosphatase: 105 U/L (ref 38–126)
Anion gap: 14 (ref 5–15)
BUN: 29 mg/dL — ABNORMAL HIGH (ref 6–20)
CHLORIDE: 98 mmol/L (ref 98–111)
CO2: 27 mmol/L (ref 22–32)
CREATININE: 7.89 mg/dL — AB (ref 0.61–1.24)
Calcium: 9.6 mg/dL (ref 8.9–10.3)
GFR calc non Af Amer: 7 mL/min — ABNORMAL LOW (ref >60)
GFR, EST AFRICAN AMERICAN: 8 mL/min — AB (ref >60)
Glucose, Bld: 78 mg/dL (ref 70–99)
POTASSIUM: 4 mmol/L (ref 3.5–5.1)
SODIUM: 139 mmol/L (ref 135–145)
Total Bilirubin: 1 mg/dL (ref 0.3–1.2)
Total Protein: 8.1 g/dL (ref 6.5–8.1)

## 2018-01-31 LAB — GLUCOSE, CAPILLARY: GLUCOSE-CAPILLARY: 76 mg/dL (ref 70–99)

## 2018-01-31 LAB — LIPASE, BLOOD: LIPASE: 39 U/L (ref 11–51)

## 2018-01-31 LAB — I-STAT CG4 LACTIC ACID, ED: Lactic Acid, Venous: 1.87 mmol/L (ref 0.5–1.9)

## 2018-01-31 LAB — CREATININE, SERUM
CREATININE: 8.36 mg/dL — AB (ref 0.61–1.24)
GFR calc Af Amer: 7 mL/min — ABNORMAL LOW (ref >60)
GFR calc non Af Amer: 6 mL/min — ABNORMAL LOW (ref >60)

## 2018-01-31 MED ORDER — FENTANYL CITRATE (PF) 100 MCG/2ML IJ SOLN
50.0000 ug | Freq: Once | INTRAMUSCULAR | Status: AC
  Administered 2018-01-31: 50 ug via INTRAVENOUS
  Filled 2018-01-31: qty 2

## 2018-01-31 MED ORDER — DIPHENHYDRAMINE HCL 50 MG/ML IJ SOLN
25.0000 mg | Freq: Once | INTRAMUSCULAR | Status: AC
  Administered 2018-01-31: 25 mg via INTRAVENOUS
  Filled 2018-01-31: qty 1

## 2018-01-31 MED ORDER — ONDANSETRON HCL 4 MG/2ML IJ SOLN
4.0000 mg | Freq: Once | INTRAMUSCULAR | Status: AC
  Administered 2018-01-31: 4 mg via INTRAVENOUS
  Filled 2018-01-31: qty 2

## 2018-01-31 MED ORDER — IBUPROFEN 400 MG PO TABS: 600.0000 mg | ORAL_TABLET | Freq: Four times a day (QID) | ORAL | Status: DC | PRN

## 2018-01-31 MED ORDER — VANCOMYCIN HCL 10 G IV SOLR
1750.0000 mg | Freq: Once | INTRAVENOUS | Status: AC
  Administered 2018-01-31: 1750 mg via INTRAVENOUS
  Filled 2018-01-31: qty 1750

## 2018-01-31 MED ORDER — LISINOPRIL 5 MG PO TABS
5.0000 mg | ORAL_TABLET | Freq: Every day | ORAL | Status: DC
  Administered 2018-01-31 – 2018-02-05 (×6): 5 mg via ORAL
  Filled 2018-01-31 (×6): qty 1

## 2018-01-31 MED ORDER — PANTOPRAZOLE SODIUM 40 MG PO TBEC
40.0000 mg | DELAYED_RELEASE_TABLET | Freq: Every day | ORAL | Status: DC
  Administered 2018-01-31 – 2018-02-05 (×6): 40 mg via ORAL
  Filled 2018-01-31 (×7): qty 1

## 2018-01-31 MED ORDER — POLYETHYLENE GLYCOL 3350 17 G PO PACK: 17.0000 g | PACK | Freq: Every day | ORAL | Status: DC | PRN

## 2018-01-31 MED ORDER — METOCLOPRAMIDE HCL 5 MG/ML IJ SOLN
10.0000 mg | Freq: Once | INTRAMUSCULAR | Status: AC
  Administered 2018-01-31: 10 mg via INTRAVENOUS
  Filled 2018-01-31: qty 2

## 2018-01-31 MED ORDER — PIPERACILLIN-TAZOBACTAM 3.375 G IVPB 30 MIN
3.3750 g | Freq: Once | INTRAVENOUS | Status: AC
  Administered 2018-01-31: 3.375 g via INTRAVENOUS
  Filled 2018-01-31: qty 50

## 2018-01-31 MED ORDER — INSULIN ASPART 100 UNIT/ML ~~LOC~~ SOLN
0.0000 [IU] | Freq: Three times a day (TID) | SUBCUTANEOUS | Status: DC
  Administered 2018-02-05: 1 [IU] via SUBCUTANEOUS

## 2018-01-31 MED ORDER — DOCUSATE SODIUM 100 MG PO CAPS
100.0000 mg | ORAL_CAPSULE | Freq: Two times a day (BID) | ORAL | Status: DC
  Administered 2018-02-01 – 2018-02-05 (×6): 100 mg via ORAL
  Filled 2018-01-31 (×8): qty 1

## 2018-01-31 MED ORDER — TRAMADOL HCL 50 MG PO TABS
100.0000 mg | ORAL_TABLET | Freq: Two times a day (BID) | ORAL | Status: DC | PRN
  Filled 2018-01-31: qty 2

## 2018-01-31 MED ORDER — ENOXAPARIN SODIUM 40 MG/0.4ML ~~LOC~~ SOLN
40.0000 mg | SUBCUTANEOUS | Status: DC
  Administered 2018-01-31: 40 mg via SUBCUTANEOUS
  Filled 2018-01-31: qty 0.4

## 2018-01-31 MED ORDER — HYDROMORPHONE HCL 1 MG/ML IJ SOLN
1.0000 mg | Freq: Four times a day (QID) | INTRAMUSCULAR | Status: DC | PRN
  Administered 2018-01-31 – 2018-02-01 (×3): 1 mg via INTRAVENOUS
  Filled 2018-01-31 (×3): qty 1

## 2018-01-31 MED ORDER — IBUPROFEN 200 MG PO TABS: 200.0000 mg | ORAL_TABLET | Freq: Four times a day (QID) | ORAL | Status: DC | PRN

## 2018-01-31 MED ORDER — HYDROMORPHONE HCL 1 MG/ML IJ SOLN
1.0000 mg | Freq: Once | INTRAMUSCULAR | Status: AC
  Administered 2018-01-31: 1 mg via INTRAVENOUS
  Filled 2018-01-31: qty 1
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
## 2018-01-31 NOTE — ED Notes (Signed)
Attempted report 

## 2018-01-31 NOTE — ED Notes (Signed)
PT refused to go to X-ray initially b/c he wanted pain meds to work first.
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
## 2018-01-31 NOTE — ED Notes (Signed)
Pt placed on 2L [place] for sats of 88%.
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
## 2018-01-31 NOTE — ED Triage Notes (Addendum)
Pt reports vomiting and lower back pain since yesterday, denies abd pain, reports chills. Last dialysis thurs/friday.

## 2018-01-31 NOTE — ED Notes (Signed)
Patient returned from [place].

## 2018-01-31 NOTE — ED Notes (Signed)
Pt insisting that the last time he needed a thoracentesis, he was able to eat right up to the procedure.  Pt insisted rn notify MD.  MD stated no, order will not be changed.  Pt notified.

## 2018-02-01 ENCOUNTER — Observation Stay (HOSPITAL_COMMUNITY): Payer: Medicare Other

## 2018-02-01 DIAGNOSIS — R112 Nausea with vomiting, unspecified: Secondary | ICD-10-CM | POA: Diagnosis not present

## 2018-02-01 DIAGNOSIS — J90 Pleural effusion, not elsewhere classified: Secondary | ICD-10-CM | POA: Diagnosis not present

## 2018-02-01 DIAGNOSIS — N186 End stage renal disease: Secondary | ICD-10-CM | POA: Diagnosis not present

## 2018-02-01 DIAGNOSIS — I10 Essential (primary) hypertension: Secondary | ICD-10-CM | POA: Diagnosis not present

## 2018-02-01 DIAGNOSIS — E1165 Type 2 diabetes mellitus with hyperglycemia: Secondary | ICD-10-CM | POA: Diagnosis not present

## 2018-02-01 DIAGNOSIS — E1129 Type 2 diabetes mellitus with other diabetic kidney complication: Secondary | ICD-10-CM | POA: Diagnosis not present

## 2018-02-01 LAB — BASIC METABOLIC PANEL
Anion gap: 15 (ref 5–15)
BUN: 38 mg/dL — ABNORMAL HIGH (ref 6–20)
CALCIUM: 9.6 mg/dL (ref 8.9–10.3)
CO2: 22 mmol/L (ref 22–32)
CREATININE: 9.25 mg/dL — AB (ref 0.61–1.24)
Chloride: 105 mmol/L (ref 98–111)
GFR calc Af Amer: 7 mL/min — ABNORMAL LOW (ref >60)
GFR, EST NON AFRICAN AMERICAN: 6 mL/min — AB (ref >60)
Glucose, Bld: 58 mg/dL — ABNORMAL LOW (ref 70–99)
POTASSIUM: 5.4 mmol/L — AB (ref 3.5–5.1)
SODIUM: 142 mmol/L (ref 135–145)

## 2018-02-01 LAB — BODY FLUID CELL COUNT WITH DIFFERENTIAL
EOS FL: 26 %
LYMPHS FL: 46 %
MONOCYTE-MACROPHAGE-SEROUS FLUID: 21 % — AB (ref 50–90)
NEUTROPHIL FLUID: 7 % (ref 0–25)
WBC FLUID: 202 uL (ref 0–1000)

## 2018-02-01 LAB — GLUCOSE, CAPILLARY
GLUCOSE-CAPILLARY: 65 mg/dL — AB (ref 70–99)
Glucose-Capillary: 69 mg/dL — ABNORMAL LOW (ref 70–99)
Glucose-Capillary: 76 mg/dL (ref 70–99)
Glucose-Capillary: 69 mg/dL — ABNORMAL LOW (ref 70–99)

## 2018-02-01 MED ORDER — HYDROCERIN EX CREA
TOPICAL_CREAM | Freq: Two times a day (BID) | CUTANEOUS | Status: DC
  Administered 2018-02-01 – 2018-02-05 (×2): via TOPICAL
  Filled 2018-02-01 (×2): qty 113

## 2018-02-01 MED ORDER — SEVELAMER CARBONATE 800 MG PO TABS
1600.0000 mg | ORAL_TABLET | Freq: Three times a day (TID) | ORAL | Status: DC
  Filled 2018-02-01 (×2): qty 2

## 2018-02-01 MED ORDER — CYCLOBENZAPRINE HCL 10 MG PO TABS
5.0000 mg | ORAL_TABLET | Freq: Three times a day (TID) | ORAL | Status: DC
  Administered 2018-02-01 – 2018-02-05 (×14): 5 mg via ORAL
  Filled 2018-02-01 (×14): qty 1

## 2018-02-01 MED ORDER — GI COCKTAIL ~~LOC~~
30.0000 mL | Freq: Three times a day (TID) | ORAL | Status: DC | PRN
  Filled 2018-02-01: qty 30

## 2018-02-01 MED ORDER — CHLORHEXIDINE GLUCONATE CLOTH 2 % EX PADS: 6.0000 | MEDICATED_PAD | Freq: Every day | CUTANEOUS | Status: DC

## 2018-02-01 MED ORDER — ENOXAPARIN SODIUM 30 MG/0.3ML ~~LOC~~ SOLN
30.0000 mg | SUBCUTANEOUS | Status: DC
  Administered 2018-02-01 – 2018-02-03 (×3): 30 mg via SUBCUTANEOUS
  Filled 2018-02-01 (×3): qty 0.3

## 2018-02-01 MED ORDER — HYDROMORPHONE HCL 1 MG/ML IJ SOLN
1.0000 mg | INTRAMUSCULAR | Status: DC | PRN
  Administered 2018-02-01 – 2018-02-05 (×25): 1 mg via INTRAVENOUS
  Filled 2018-02-01 (×24): qty 1

## 2018-02-01 MED ORDER — LIDOCAINE HCL (PF) 1 % IJ SOLN
INTRAMUSCULAR | Status: AC
  Filled 2018-02-01: qty 10

## 2018-02-01 MED ORDER — ONDANSETRON HCL 4 MG/2ML IJ SOLN
4.0000 mg | Freq: Four times a day (QID) | INTRAMUSCULAR | Status: DC
  Administered 2018-02-01 – 2018-02-02 (×5): 4 mg via INTRAVENOUS
  Filled 2018-02-01 (×5): qty 2

## 2018-02-01 MED ORDER — DICLOFENAC SODIUM 1 % TD GEL
4.0000 g | Freq: Four times a day (QID) | TRANSDERMAL | Status: DC
  Administered 2018-02-01 – 2018-02-05 (×5): 4 g via TOPICAL
  Filled 2018-02-01: qty 100

## 2018-02-01 MED ORDER — CINACALCET HCL 30 MG PO TABS
90.0000 mg | ORAL_TABLET | Freq: Every day | ORAL | Status: DC
  Administered 2018-02-01 – 2018-02-04 (×4): 90 mg via ORAL
  Filled 2018-02-01 (×4): qty 3

## 2018-02-01 MED ORDER — DOXERCALCIFEROL 4 MCG/2ML IV SOLN
7.0000 ug | INTRAVENOUS | Status: DC
  Administered 2018-02-02: 7 ug via INTRAVENOUS
  Filled 2018-02-01 (×2): qty 4
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
## 2018-02-01 NOTE — Procedures (Signed)
PROCEDURE SUMMARY:  Successful US guided right thoracentesis. Yielded 1.4 L of clear yellow fluid. Pt tolerated procedure well. No immediate complications.  Specimen was sent for labs. CXR ordered.  Ascencion Dike PA-C 02/01/2018 9:17 AM

## 2018-02-01 NOTE — Progress Notes (Signed)
PROGRESS NOTE    Frank [name]   PPI:[id number]  DOB: July 17, 1966  DOA: 01/31/2018 PCP: Patient, [name]   Brief Narrative:  Frank [name] is a 52 y.o. 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  he has had both symptoms for about 4 days now. No blood in vomitus. No diarrhea.  He was treated for small bowel obstruction from 6/27-7/1. Pulse ox in ED was in the 80s.    Subjective: Barely able to eat this AM. Last vomited yellow fluid this AM. No h/o reflux, gastritis or ulcers in the stomach. No diarrhea.   Pain in back is severe and 5 mg of Oxycodone was not working at home. He took some old Dilaudid yesterday at home and it is what is helping now. Pain in back does not radiate to the legs and is worse when moving and walking.    Assessment & Plan:   Principal Problem:   Recurrent right pleural effusion - s/p thoracentesis of 1.4 L today- fluid looks transudative - no underlying infiltrate noted   Active Problems: Abdominal pain and vomiting - pain in epigastric and mid abdominal - he vomited on an empty stomach today - Lipase is normal - he states he does not drink alcohol - abdominal CT negative for etiology of pain and vomiting-  - will double Protonix to BID, start GI cocktail and add Zofran QID  Renal mass - noted on CT  Right back pain - likely a sprain- start Voltaren gel, Flexeril and continue IV Dilaudid  ESRD - nephrology has been consulted  Acute thrombocytopenia - Platelets 126 and 131- follow   Mild hyperkalemia - follow- should improved with dialysis    Anemia of chronic kidney  failure     DM (diabetes mellitus), type 2, uncontrolled, with renal complications  - cont SSI for now    Essential hypertension - cont Lisinopril     DVT prophylaxis: Lovenox Code Status: Full code Family Communication:  Disposition Plan: home when stable Consultants:   IR  Nephrology Procedures:   Right thoracentesis Antimicrobials:  Anti-infectives (From admission, onward)   Start     Dose/Rate Route Frequency Ordered Stop   01/31/18 1300  vancomycin (VANCOCIN) 1,750 mg in sodium chloride 0.9 % 500 mL IVPB     1,750 mg 250 mL/hr over 120 Minutes Intravenous  Once 01/31/18 1218 01/31/18 1554   01/31/18 1200  piperacillin-tazobactam (ZOSYN) IVPB 3.375 g     3.375 g 100 mL/hr over 30 Minutes Intravenous  Once 01/31/18 1156 01/31/18 1424       Objective: Vitals:   01/31/18 2300 02/01/18 0500 02/01/18 0817 02/01/18 0900  BP: (!) 140/99  (!) 169/121 (!) 160/120  Pulse: 87  87   Resp: 20     Temp: 97.8 F (36.6 C)     TempSrc: Oral     SpO2: 92%  96%   Weight:  74.3 kg (163 lb 12.8 oz)    Height:        Intake/Output Summary (Last 24 hours) at 02/01/2018 1157 Last data filed at 01/31/2018 1554 Gross per 24 hour  Intake 550 ml  Output -  Net 550 ml   [employer]  01/31/18 1645 02/01/18 0500  Weight: 74.7 kg (164 lb 10.9 oz) 74.3 kg (163 lb 12.8 oz)    Examination: General exam: Appears comfortable  HEENT: PERRLA, oral mucosa moist, no sclera icterus or thrush Respiratory system: Clear to auscultation. Respiratory effort normal. Cardiovascular system: S1 & S2 heard, RRR.   Gastrointestinal system: Abdomen soft, non-tender, nondistended. Normal bowel sound. No organomegaly Central nervous system: Alert and oriented. No focal neurological deficits. Extremities: No cyanosis, clubbing or edema Skin: No rashes or ulcers Psychiatry:  Mood & affect appropriate.     Data Reviewed: I have personally reviewed following labs and imaging  studies  CBC: Recent Labs  Lab 01/28/18 0411 01/31/18 0922 01/31/18 1742  WBC 4.3 3.6* 3.4*  HGB 11.1* 11.3* 11.1*  HCT 36.8* 36.9* 35.7*  MCV 86.4 86.2 85.4  PLT 199 126* 025*   Basic Metabolic Panel: Recent Labs  Lab 01/28/18 0411 01/31/18 0922 01/31/18 1742 02/01/18 0245  NA 139 139  --  142  K 4.2 4.0  --  5.4*  CL 99 98  --  105  CO2 24 27  --  22  GLUCOSE 88 78  --  58*  BUN 44* 29*  --  38*  CREATININE 11.53* 7.89* 8.36* 9.25*  CALCIUM 9.4 9.6  --  9.6   GFR: Estimated Creatinine Clearance: 9.7 mL/min (A) (by C-G formula based on SCr of 9.25 mg/dL (H)). Liver Function Tests: Recent Labs  Lab 01/28/18 0411 01/31/18 0922  AST 24 24  ALT 9 11  ALKPHOS 120 105  BILITOT 0.9 1.0  PROT 8.0 8.1  ALBUMIN 3.0* 3.0*   Recent Labs  Lab 01/28/18 0411 01/31/18 0922  LIPASE 88* 39   No results for input(s): AMMONIA in the last 168 hours. Coagulation Profile: No results for input(s): INR, PROTIME in the last 168 hours. Cardiac Enzymes: No results for input(s): CKTOTAL, CKMB, CKMBINDEX, TROPONINI in the last 168 hours. BNP (last 3 results) No results for input(s): PROBNP in the last 8760 hours. HbA1C: No results for input(s): HGBA1C in the last 72 hours. CBG: Recent Labs  Lab 01/25/18 1811 01/31/18 2331  GLUCAP 71 76   Lipid Profile: No results for input(s): CHOL, HDL, LDLCALC, TRIG, CHOLHDL, LDLDIRECT in the last 72 hours. Thyroid Function Tests: No results for input(s): TSH, T4TOTAL, FREET4, T3FREE, THYROIDAB in the last 72 hours. Anemia Panel: No results for input(s): VITAMINB12, FOLATE, FERRITIN, TIBC, IRON, RETICCTPCT in the last 72 hours. Urine analysis:    Component Value Date/Time   COLORURINE YELLOW 10/18/2013 0419   APPEARANCEUR CLEAR 10/18/2013 0419   LABSPEC 1.008 10/18/2013 0419   PHURINE 8.5 (H) 10/18/2013 0419   GLUCOSEU 100 (A) 10/18/2013 0419   HGBUR TRACE (A) 10/18/2013 0419   BILIRUBINUR NEGATIVE 10/18/2013 0419   KETONESUR  NEGATIVE 10/18/2013 0419   PROTEINUR 100 (A) 10/18/2013 0419   UROBILINOGEN 0.2 10/18/2013 0419   NITRITE NEGATIVE 10/18/2013 0419   LEUKOCYTESUR NEGATIVE 10/18/2013 0419   Sepsis Labs: _0 (procalcitonin:4,lacticidven:4) )No results found for this or any previous visit (from the past 240 hour(s)).       Radiology Studies: Ct Abdomen Pelvis Wo Contrast  Result Date: 01/31/2018 CLINICAL DATA:  Nausea and vomiting. EXAM: CT CHEST, ABDOMEN AND PELVIS WITHOUT CONTRAST TECHNIQUE: Multidetector CT imaging of the chest, abdomen and pelvis was performed following the standard protocol without IV contrast. COMPARISON:  Chest and abdominal x-rays from same day. CT abdomen pelvis dated January 25, 2018. CT chest dated November 09, 2017. FINDINGS:  CT CHEST FINDINGS Cardiovascular: Unchanged moderate cardiomegaly and small pericardial effusion. Normal caliber thoracic aorta. Coronary, aortic arch, and branch vessel atherosclerotic vascular disease. Unchanged mildly enlarged main pulmonary artery measuring up to 3.4 cm. Mediastinum/Nodes: Mildly enlarged mediastinal lymph nodes appears stable to slightly decreased in size. No axillary lymphadenopathy. The thyroid gland, trachea, and esophagus demonstrate no significant findings. Lungs/Pleura: Large right pleural effusion. Faint layering ground-glass density in both upper lobes likely reflects mild pulmonary edema. Increased atelectasis in the right upper, middle, and lower lobes. Mild peribronchial thickening. No pneumothorax. No suspicious pulmonary nodule. Musculoskeletal: Diffuse sclerosis, consistent with renal osteodystrophy. No acute or significant osseous findings. Old fracture deformity of the right proximal clavicle. CT ABDOMEN PELVIS FINDINGS Hepatobiliary: No focal liver abnormality is seen. No gallstones, gallbladder wall thickening, or biliary dilatation. Pancreas: Unremarkable. No pancreatic ductal dilatation or surrounding inflammatory changes.  Spleen: Normal in size without focal abnormality. Adrenals/Urinary Tract: The adrenal glands are unremarkable. Bilateral renal atrophy again noted. Unchanged 3.3 cm thick-walled complex lesion arising from the left kidney. Unchanged small bilateral simple cysts. No renal or ureteral calculi. No hydronephrosis. 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 The bladder is unremarkable for the degree of distention. Stomach/Bowel: Stomach is within normal limits. Appendix not definitively visualized, however there are no signs of inflammation at the base of the cecum. No evidence of bowel wall thickening, distention, or inflammatory changes. Vascular/Lymphatic: Aortic atherosclerosis. No enlarged abdominal or pelvic lymph nodes. Reproductive: Prostate is unremarkable. Other: No free fluid or pneumoperitoneum. Musculoskeletal: Diffuse sclerosis. No acute or significant osseous findings. IMPRESSION: Chest: 1. Large right pleural effusion with compressive right lung atelectasis, greatest in the right lower lobe. 2. Unchanged moderate cardiomegaly and small pericardial effusion. Faint layering ground-glass density in both upper lobes likely reflects mild pulmonary edema. 3. Unchanged mildly enlarged main pulmonary artery, consistent with pulmonary arterial hypertension. 4. Stable to slight interval decrease in size of mildly enlarged mediastinal lymph nodes, favored reactive. 5.  Aortic atherosclerosis (ICD10-I70.0). Abdomen and pelvis: 1.  No acute intra-abdominal process. 2. Unchanged 3.3 cm indeterminate left renal mass. Electronically Signed   By: Titus Dubin M.D.   On: 01/31/2018 13:59   X-ray Chest Pa And Lateral  Result Date: 02/01/2018 CLINICAL DATA:  52 year old male with recurrent right-sided pleural effusion now status post repeat right thoracentesis. EXAM: CHEST - 2 VIEW COMPARISON:  Most recent prior chest x-ray 01/31/2018 FINDINGS: No evidence of pneumothorax following  right-sided thoracentesis. Significantly reduced right-sided pleural effusion. Minimal residual pleural fluid and atelectasis. Stable cardiomegaly. Atherosclerotic calcifications are again noted in the transverse aorta. Mild vascular congestion without overt edema. No acute osseous abnormality. IMPRESSION: Status post right-sided thoracentesis without evidence of pneumothorax. Electronically Signed   By: Jacqulynn Cadet M.D.   On: 02/01/2018 10:00   Ct Chest Wo Contrast  Result Date: 01/31/2018 CLINICAL DATA:  Nausea and vomiting. EXAM: CT CHEST, ABDOMEN AND PELVIS WITHOUT CONTRAST TECHNIQUE: Multidetector CT imaging of the chest, abdomen and pelvis was performed following the standard protocol without IV contrast. COMPARISON:  Chest and abdominal x-rays from same day. CT abdomen pelvis dated January 25, 2018. CT chest dated November 09, 2017. FINDINGS: CT CHEST FINDINGS Cardiovascular: Unchanged moderate cardiomegaly and small pericardial effusion. Normal caliber thoracic aorta. Coronary, aortic arch, and branch vessel atherosclerotic vascular disease. Unchanged mildly enlarged main pulmonary artery measuring up to 3.4 cm. Mediastinum/Nodes: Mildly enlarged mediastinal lymph nodes appears stable to slightly decreased in size. No axillary lymphadenopathy. The thyroid gland, trachea,  and esophagus demonstrate no significant findings. Lungs/Pleura: Large right pleural effusion. Faint layering ground-glass density in both upper lobes likely reflects mild pulmonary edema. Increased atelectasis in the right upper, middle, and lower lobes. Mild peribronchial thickening. No pneumothorax. No suspicious pulmonary nodule. Musculoskeletal: Diffuse sclerosis, consistent with renal osteodystrophy. No acute or significant osseous findings. Old fracture deformity of the right proximal clavicle. CT ABDOMEN PELVIS FINDINGS Hepatobiliary: No focal liver abnormality is seen. No gallstones, gallbladder wall thickening, or biliary  dilatation. Pancreas: Unremarkable. No pancreatic ductal dilatation or surrounding inflammatory changes. Spleen: Normal in size without focal abnormality. Adrenals/Urinary Tract: The adrenal glands are unremarkable. Bilateral renal atrophy again noted. Unchanged 3.3 cm thick-walled complex lesion arising from the left kidney. Unchanged small bilateral simple cysts. No renal or ureteral calculi. No hydronephrosis. 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 The bladder is unremarkable for the degree of distention. Stomach/Bowel: Stomach is within normal limits. Appendix not definitively visualized, however there are no signs of inflammation at the base of the cecum. No evidence of bowel wall thickening, distention, or inflammatory changes. Vascular/Lymphatic: Aortic atherosclerosis. No enlarged abdominal or pelvic lymph nodes. Reproductive: Prostate is unremarkable. Other: No free fluid or pneumoperitoneum. Musculoskeletal: Diffuse sclerosis. No acute or significant osseous findings. IMPRESSION: Chest: 1. Large right pleural effusion with compressive right lung atelectasis, greatest in the right lower lobe. 2. Unchanged moderate cardiomegaly and small pericardial effusion. Faint layering ground-glass density in both upper lobes likely reflects mild pulmonary edema. 3. Unchanged mildly enlarged main pulmonary artery, consistent with pulmonary arterial hypertension. 4. Stable to slight interval decrease in size of mildly enlarged mediastinal lymph nodes, favored reactive. 5.  Aortic atherosclerosis (ICD10-I70.0). Abdomen and pelvis: 1.  No acute intra-abdominal process. 2. Unchanged 3.3 cm indeterminate left renal mass. Electronically Signed   By: Titus Dubin M.D.   On: 01/31/2018 13:59   Dg Abdomen Acute W/chest  Result Date: 01/31/2018 CLINICAL DATA:  Vomiting. EXAM: DG ABDOMEN ACUTE W/ 1V CHEST COMPARISON:  01/28/2018 FINDINGS: Chest x-ray shows some increase in moderate right  pleural effusion with associated atelectasis and consolidation of the right lower lung. Component of pneumonia cannot be excluded. Mild interstitial edema appears similar to the prior study. Abdominal films show no bowel obstruction or ileus. No free air identified. No abnormal calcifications. Bony structures are unremarkable. IMPRESSION: Increase in volume of moderate right pleural effusion with associated atelectasis and consolidation of the right lower lung. Underlying pneumonia cannot be excluded. Mild pulmonary interstitial edema appears similar to the prior study and is likely chronic. Electronically Signed   By: Aletta Edouard M.D.   On: 01/31/2018 10:58   US Thoracentesis Asp Pleural Space W/img Guide  Result Date: 02/01/2018 INDICATION: End-stage renal disease on hemodialysis. Shortness of breath. Right-sided pleural effusion. Request for diagnostic and therapeutic thoracentesis. EXAM: ULTRASOUND GUIDED RIGHT THORACENTESIS MEDICATIONS: None. COMPLICATIONS: None immediate. PROCEDURE: An ultrasound guided thoracentesis was thoroughly discussed with the patient and questions answered. The benefits, risks, alternatives and complications were also discussed. The patient understands and wishes to proceed with the procedure. Written consent was obtained. Ultrasound was performed to localize and mark an adequate pocket of fluid in the right chest. The area was then prepped and draped in the normal sterile fashion. 1% Lidocaine was used for local anesthesia. Under ultrasound guidance a 6 Fr Safe-T-Centesis catheter was introduced. Thoracentesis was performed. The catheter was removed and a dressing applied. FINDINGS: A total of approximately 1.4 L of clear yellow fluid was removed. Samples  were sent to the laboratory as requested by the clinical team. IMPRESSION: Successful ultrasound guided right thoracentesis yielding 1.4 L of pleural fluid. Read by: Ascencion Dike PA-C Electronically Signed   By: Jacqulynn Cadet M.D.   On: 02/01/2018 10:33      Scheduled Meds: . cyclobenzaprine  5 mg Oral TID  . diclofenac sodium  4 g Topical QID  . docusate sodium  100 mg Oral BID  . enoxaparin (LOVENOX) injection  30 mg Subcutaneous Q24H  . hydrocerin   Topical BID  . insulin aspart  0-9 Units Subcutaneous TID WC  . lidocaine (PF)      . lisinopril  5 mg Oral Daily  . ondansetron (ZOFRAN) IV  4 mg Intravenous Q6H  . pantoprazole  40 mg Oral Daily  . sevelamer carbonate  1,600 mg Oral TID WC   Continuous Infusions:   LOS: 0 days    Time spent in minutes: 35    Debbe Odea, MD [facility] Pager: [email or web address] Password [facility] 02/01/2018, 11:57 AM

## 2018-02-01 NOTE — Plan of Care (Signed)

## 2018-02-02 ENCOUNTER — Encounter (HOSPITAL_COMMUNITY): Payer: Self-pay | Admitting: Physician Assistant

## 2018-02-02 DIAGNOSIS — Z886 Allergy status to analgesic agent status: Secondary | ICD-10-CM | POA: Diagnosis not present

## 2018-02-02 DIAGNOSIS — Z885 Allergy status to narcotic agent status: Secondary | ICD-10-CM | POA: Diagnosis not present

## 2018-02-02 DIAGNOSIS — Z79899 Other long term (current) drug therapy: Secondary | ICD-10-CM | POA: Diagnosis not present

## 2018-02-02 DIAGNOSIS — J810 Acute pulmonary edema: Secondary | ICD-10-CM | POA: Diagnosis not present

## 2018-02-02 DIAGNOSIS — R0902 Hypoxemia: Secondary | ICD-10-CM | POA: Diagnosis not present

## 2018-02-02 DIAGNOSIS — Z86718 Personal history of other venous thrombosis and embolism: Secondary | ICD-10-CM | POA: Diagnosis not present

## 2018-02-02 DIAGNOSIS — I10 Essential (primary) hypertension: Secondary | ICD-10-CM | POA: Diagnosis not present

## 2018-02-02 DIAGNOSIS — R1033 Periumbilical pain: Secondary | ICD-10-CM

## 2018-02-02 DIAGNOSIS — Z888 Allergy status to other drugs, medicaments and biological substances status: Secondary | ICD-10-CM | POA: Diagnosis not present

## 2018-02-02 DIAGNOSIS — Z9119 Patient's noncompliance with other medical treatment and regimen: Secondary | ICD-10-CM | POA: Diagnosis not present

## 2018-02-02 DIAGNOSIS — J90 Pleural effusion, not elsewhere classified: Secondary | ICD-10-CM | POA: Diagnosis not present

## 2018-02-02 DIAGNOSIS — N2889 Other specified disorders of kidney and ureter: Secondary | ICD-10-CM | POA: Diagnosis present

## 2018-02-02 DIAGNOSIS — D631 Anemia in chronic kidney disease: Secondary | ICD-10-CM | POA: Diagnosis not present

## 2018-02-02 DIAGNOSIS — Z86711 Personal history of pulmonary embolism: Secondary | ICD-10-CM | POA: Diagnosis not present

## 2018-02-02 DIAGNOSIS — R112 Nausea with vomiting, unspecified: Secondary | ICD-10-CM | POA: Diagnosis not present

## 2018-02-02 DIAGNOSIS — N186 End stage renal disease: Secondary | ICD-10-CM | POA: Diagnosis not present

## 2018-02-02 DIAGNOSIS — K56609 Unspecified intestinal obstruction, unspecified as to partial versus complete obstruction: Secondary | ICD-10-CM | POA: Diagnosis not present

## 2018-02-02 DIAGNOSIS — Z87891 Personal history of nicotine dependence: Secondary | ICD-10-CM | POA: Diagnosis not present

## 2018-02-02 DIAGNOSIS — E1129 Type 2 diabetes mellitus with other diabetic kidney complication: Secondary | ICD-10-CM | POA: Diagnosis not present

## 2018-02-02 DIAGNOSIS — T8610 Unspecified complication of kidney transplant: Secondary | ICD-10-CM | POA: Diagnosis not present

## 2018-02-02 DIAGNOSIS — I428 Other cardiomyopathies: Secondary | ICD-10-CM | POA: Diagnosis present

## 2018-02-02 DIAGNOSIS — I5042 Chronic combined systolic (congestive) and diastolic (congestive) heart failure: Secondary | ICD-10-CM | POA: Diagnosis present

## 2018-02-02 DIAGNOSIS — E1122 Type 2 diabetes mellitus with diabetic chronic kidney disease: Secondary | ICD-10-CM | POA: Diagnosis present

## 2018-02-02 DIAGNOSIS — Z79891 Long term (current) use of opiate analgesic: Secondary | ICD-10-CM | POA: Diagnosis not present

## 2018-02-02 DIAGNOSIS — I120 Hypertensive chronic kidney disease with stage 5 chronic kidney disease or end stage renal disease: Secondary | ICD-10-CM | POA: Diagnosis not present

## 2018-02-02 DIAGNOSIS — D696 Thrombocytopenia, unspecified: Secondary | ICD-10-CM | POA: Diagnosis present

## 2018-02-02 DIAGNOSIS — Z992 Dependence on renal dialysis: Secondary | ICD-10-CM | POA: Diagnosis not present

## 2018-02-02 DIAGNOSIS — K861 Other chronic pancreatitis: Secondary | ICD-10-CM | POA: Diagnosis not present

## 2018-02-02 DIAGNOSIS — E875 Hyperkalemia: Secondary | ICD-10-CM | POA: Diagnosis present

## 2018-02-02 DIAGNOSIS — E1165 Type 2 diabetes mellitus with hyperglycemia: Secondary | ICD-10-CM | POA: Diagnosis not present

## 2018-02-02 DIAGNOSIS — Z7901 Long term (current) use of anticoagulants: Secondary | ICD-10-CM | POA: Diagnosis not present

## 2018-02-02 DIAGNOSIS — R1010 Upper abdominal pain, unspecified: Secondary | ICD-10-CM | POA: Diagnosis not present

## 2018-02-02 DIAGNOSIS — N185 Chronic kidney disease, stage 5: Secondary | ICD-10-CM | POA: Diagnosis not present

## 2018-02-02 DIAGNOSIS — K219 Gastro-esophageal reflux disease without esophagitis: Secondary | ICD-10-CM | POA: Diagnosis present

## 2018-02-02 DIAGNOSIS — I132 Hypertensive heart and chronic kidney disease with heart failure and with stage 5 chronic kidney disease, or end stage renal disease: Secondary | ICD-10-CM | POA: Diagnosis present

## 2018-02-02 LAB — CBC
HCT: 34.9 % — ABNORMAL LOW (ref 39.0–52.0)
Hemoglobin: 11 g/dL — ABNORMAL LOW (ref 13.0–17.0)
MCH: 26.8 pg (ref 26.0–34.0)
MCHC: 31.5 g/dL (ref 30.0–36.0)
MCV: 85.1 fL (ref 78.0–100.0)
PLATELETS: 123 10*3/uL — AB (ref 150–400)
RBC: 4.1 MIL/uL — AB (ref 4.22–5.81)
RDW: 17.6 % — ABNORMAL HIGH (ref 11.5–15.5)
WBC: 3.2 10*3/uL — AB (ref 4.0–10.5)

## 2018-02-02 LAB — GRAM STAIN

## 2018-02-02 LAB — GLUCOSE, CAPILLARY
GLUCOSE-CAPILLARY: 100 mg/dL — AB (ref 70–99)
GLUCOSE-CAPILLARY: 79 mg/dL (ref 70–99)
Glucose-Capillary: 46 mg/dL — ABNORMAL LOW (ref 70–99)
Glucose-Capillary: 77 mg/dL (ref 70–99)

## 2018-02-02 LAB — RENAL FUNCTION PANEL
Albumin: 2.7 g/dL — ABNORMAL LOW (ref 3.5–5.0)
Anion gap: 14 (ref 5–15)
BUN: 46 mg/dL — ABNORMAL HIGH (ref 6–20)
CALCIUM: 8.7 mg/dL — AB (ref 8.9–10.3)
CO2: 23 mmol/L (ref 22–32)
CREATININE: 11.54 mg/dL — AB (ref 0.61–1.24)
Chloride: 99 mmol/L (ref 98–111)
GFR, EST AFRICAN AMERICAN: 5 mL/min — AB (ref >60)
GFR, EST NON AFRICAN AMERICAN: 4 mL/min — AB (ref >60)
Glucose, Bld: 110 mg/dL — ABNORMAL HIGH (ref 70–99)
PHOSPHORUS: 8.6 mg/dL — AB (ref 2.5–4.6)
Potassium: 5 mmol/L (ref 3.5–5.1)
SODIUM: 136 mmol/L (ref 135–145)

## 2018-02-02 LAB — LIPASE, BLOOD: LIPASE: 22 U/L (ref 11–51)

## 2018-02-02 LAB — PATHOLOGIST SMEAR REVIEW: Path Review: REACTIVE

## 2018-02-02 MED ORDER — HYDROMORPHONE HCL 1 MG/ML IJ SOLN
INTRAMUSCULAR | Status: AC
  Filled 2018-02-02: qty 1

## 2018-02-02 MED ORDER — DIPHENHYDRAMINE HCL 25 MG PO CAPS
ORAL_CAPSULE | ORAL | Status: AC
  Filled 2018-02-02: qty 1

## 2018-02-02 MED ORDER — DEXTROSE 50 % IV SOLN
INTRAVENOUS | Status: AC
  Administered 2018-02-02: 50 mL
  Filled 2018-02-02: qty 50

## 2018-02-02 MED ORDER — LIDOCAINE-PRILOCAINE 2.5-2.5 % EX CREA: 1.0000 "application " | TOPICAL_CREAM | CUTANEOUS | Status: DC | PRN

## 2018-02-02 MED ORDER — SEVELAMER CARBONATE 800 MG PO TABS
1600.0000 mg | ORAL_TABLET | Freq: Three times a day (TID) | ORAL | Status: DC
  Administered 2018-02-03: 1600 mg via ORAL
  Filled 2018-02-02 (×2): qty 2

## 2018-02-02 MED ORDER — DIPHENHYDRAMINE HCL 25 MG PO CAPS
25.0000 mg | ORAL_CAPSULE | Freq: Once | ORAL | Status: AC
  Administered 2018-02-02: 25 mg via ORAL

## 2018-02-02 MED ORDER — ALTEPLASE 2 MG IJ SOLR: 2.0000 mg | Freq: Once | INTRAMUSCULAR | Status: DC | PRN

## 2018-02-02 MED ORDER — PENTAFLUOROPROP-TETRAFLUOROETH EX AERO: 1.0000 "application " | INHALATION_SPRAY | CUTANEOUS | Status: DC | PRN

## 2018-02-02 MED ORDER — IOPAMIDOL (ISOVUE-300) INJECTION 61%
INTRAVENOUS | Status: AC
  Filled 2018-02-02: qty 30

## 2018-02-02 MED ORDER — HEPARIN SODIUM (PORCINE) 1000 UNIT/ML DIALYSIS: 1000.0000 [IU] | INTRAMUSCULAR | Status: DC | PRN

## 2018-02-02 MED ORDER — DOXERCALCIFEROL 4 MCG/2ML IV SOLN
INTRAVENOUS | Status: AC
  Filled 2018-02-02: qty 4

## 2018-02-02 MED ORDER — SODIUM CHLORIDE 0.9 % IV SOLN: 100.0000 mL | INTRAVENOUS | Status: DC | PRN

## 2018-02-02 MED ORDER — LIDOCAINE HCL (PF) 1 % IJ SOLN: 5.0000 mL | INTRAMUSCULAR | Status: DC | PRN

## 2018-02-02 MED ORDER — ONDANSETRON HCL 4 MG/2ML IJ SOLN
4.0000 mg | INTRAMUSCULAR | Status: DC
  Administered 2018-02-02 – 2018-02-05 (×16): 4 mg via INTRAVENOUS
  Filled 2018-02-02 (×16): qty 2

## 2018-02-02 NOTE — Progress Notes (Signed)
Admit: 01/31/2018 LOS: 0  58M ESRD admit with N/V/Abd pain, SOB and R pleural effusion  Subjective:  . No c/o . No dyspnea   No intake/output data recorded.  Filed Weights   01/31/18 1645 02/01/18 0500 02/02/18 0300  Weight: 74.7 kg (164 lb 10.9 oz) 74.3 kg (163 lb 12.8 oz) 75.3 kg (166 lb 0.1 oz)    Scheduled Meds: . Chlorhexidine Gluconate Cloth  6 each Topical Q0600  . cinacalcet  90 mg Oral Q supper  . cyclobenzaprine  5 mg Oral TID  . diclofenac sodium  4 g Topical QID  . docusate sodium  100 mg Oral BID  . doxercalciferol  7 mcg Intravenous Q M,W,F-HD  . enoxaparin (LOVENOX) injection  30 mg Subcutaneous Q24H  . hydrocerin   Topical BID  . insulin aspart  0-9 Units Subcutaneous TID WC  . lisinopril  5 mg Oral Daily  . ondansetron (ZOFRAN) IV  4 mg Intravenous Q4H  . pantoprazole  40 mg Oral Daily  . sevelamer carbonate  1,600 mg Oral TID WC   Continuous Infusions: PRN Meds:.gi cocktail, HYDROmorphone (DILAUDID) injection, polyethylene glycol, traMADol  Current Labs: reviewed    Physical Exam:  Blood pressure (!) 157/112, pulse 88, temperature (!) 97.5 F (36.4 C), temperature source Oral, resp. rate 18, height _0  (1.88 m), weight 75.3 kg (166 lb 0.1 oz), SpO2 100 %. General: NAD,  Walking aroudn room Head: NCAT, EOMI Lungs: nl wob, scattered rhonci in bases, symmetricl, clear in upper fields Heart: RRR. No murmur, rubs or gallops.  Abdomen: s/nt/nd Lower extremities:trace edema b/l, +dry skin,  Neuro: AAOx3. Dialysis Access: LU AVF  Dialysis Orders:  MWF - [facility]  4hrs, BFR 400, DFR 800,  EDW 73kg, 2K/ 2.25 Ca  Access: LU AVF  Heparin None Mircera 225 mcg q2wks - last 7/3 Hectorol 32mg IV qHD   venofer [id number] - completed 4 of 10 venofer [id number]   A 1. R pleural effusion s/p 1.4L thoracentesis 7/14, per primary 2. ESRD MWF [facility] 3. HTN/ Vol: likely hypervolemia contributing 4. Anemia at goal 5. 2HPTH 6. N/V/Abd pain  P . HD  today on schedule, probe post weights down past EDW as able . Medication Issues; o Preferred narcotic agents for pain control are hydromorphone, fentanyl, and methadone. Morphine should not be used.  o Baclofen should be avoided o Avoid oral sodium phosphate and magnesium citrate based laxatives / bowel preps    RyaPearson Grippe 02/02/2018, 10:03 AM  Recent Labs  Lab 01/28/18 0411 01/31/18 0922 01/31/18 1742 02/01/18 0245  NA 139 139  --  142  K 4.2 4.0  --  5.4*  CL 99 98  --  105  CO2 24 27  --  22  GLUCOSE 88 78  --  58*  BUN 44* 29*  --  38*  CREATININE 11.53* 7.89* 8.36* 9.25*  CALCIUM 9.4 9.6  --  9.6   Recent Labs  Lab 01/28/18 0411 01/31/18 0922 01/31/18 1742  WBC 4.3 3.6* 3.4*  HGB 11.1* 11.3* 11.1*  HCT 36.8* 36.9* 35.7*  MCV 86.4 86.2 85.4  PLT 199 126* 131*

## 2018-02-02 NOTE — Progress Notes (Signed)
Elevated blood pressure reported to [facility]. No new orders at this time.

## 2018-02-02 NOTE — Plan of Care (Signed)
Discussed with patient the importance of drinking contrast for CT with some teach back displayed.  Patient waiting to have pain and nausea scheduled medication to start.

## 2018-02-02 NOTE — Progress Notes (Signed)
Hypoglycemic Event  CBG: 46 at 0753  Treatment: Dextrose 50% solution. Patient was given 50 ml at 0753  Symptoms: None  Follow-up CBG: [id number] CBG Result:100  Possible Reasons for Event: decrease po intake  Comments/MD notified: Dr Park Breed, Lennie Muckle

## 2018-02-02 NOTE — Care Management Note (Addendum)
Case Management Note  Patient Details  Name: ZYDEN SUMAN MRN: [id number] Date of Birth: [birth year]  Subjective/Objective:    From home alone, presents with pl effusion, s/p thoracentesis this past weekend. He is alert and oriented, ambulatory, today having abd pain, unable to eat and vomited last pm per MD note.   7/18 Tomi Bamberger RN BSN - for dc today, will need pcp prior to dc. NCM scheduled a new patient apt for patient at [facility] on 7/29 at 3:45.              Action/Plan: DC home when ready.  Expected Discharge Date:                  Expected Discharge Plan:  Home/Self Care  In-House Referral:     Discharge planning Services  CM Consult  Post Acute Care Choice:    Choice offered to:     DME Arranged:    DME Agency:     HH Arranged:    HH Agency:     Status of Service:  In process, will continue to follow  If discussed at Long Length of Stay Meetings, dates discussed:    Additional Comments:  Zenon Mayo, RN 02/02/2018, 12:54 PM
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
## 2018-02-02 NOTE — Progress Notes (Addendum)
PROGRESS NOTE    Frank [name]   NAT:[id number]  DOB: 1966/02/15  DOA: 01/31/2018 PCP: Patient, [name]   Brief Narrative:  Frank [name] is a 52 y.o. 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  he has had both symptoms for about 4 days now. No blood in vomitus. No diarrhea.  He was treated for small bowel obstruction from 6/27-7/1. Pulse ox in ED was in the 80s.    Subjective:  He continues to have mid abdominal pain and is unable to eat. He vomiting last night. Back pain is not as severe. No cough or dyspnea. Blood sugar was in the 40s his AM and he was given D50.    Assessment & Plan:   Principal Problem:   Recurrent right pleural effusion - underwent a thoracentesis on 01/19/18 and 1.5 L removed - s/p thoracentesis of 1.4 L today- fluid looks transudative - no underlying infiltrate noted   Active Problems: Abdominal pain and vomiting - pain in epigastric and mid abdominal - he vomited on an empty stomach today - recent SBO- obtain CT with contrast today - Lipase is normal  - he states he does not drink alcohol - abdominal CT x 2 without contrast negative for etiology of pain and vomiting-  -   doubled Protonix to BID,  Gave GI cocktail (didn't help much) and added Zofran QID - he still is having significant pain and vomiting- change Zofran to Q 4 hrs- will call GI today  Renal mass - noted on CT  Right back pain - likely a sprain- start Voltaren gel, Flexeril and continue IV Dilaudid  ESRD - nephrology has been consulted  Acute thrombocytopenia - Platelets 126 and 131- follow   Mild  hyperkalemia - follow- should improved with dialysis    Anemia of chronic kidney failure     DM (diabetes mellitus), type 2, uncontrolled, with renal complications  - cont SSI for now    Essential hypertension - cont Lisinopril     DVT prophylaxis: Lovenox Code Status: Full code Family Communication:  Disposition Plan: home when stable Consultants:   IR  Nephrology Procedures:   Right thoracentesis Antimicrobials:  Anti-infectives (From admission, onward)   Start     Dose/Rate Route Frequency Ordered Stop   01/31/18 1300  vancomycin (VANCOCIN) 1,750 mg in sodium chloride 0.9 % 500 mL IVPB     1,750 mg 250 mL/hr over 120 Minutes Intravenous  Once 01/31/18 1218 01/31/18 1554   01/31/18 1200  piperacillin-tazobactam (ZOSYN) IVPB 3.375 g     3.375 g 100 mL/hr over 30 Minutes Intravenous  Once 01/31/18 1156 01/31/18 1424       Objective: Vitals:   02/01/18 2356 02/02/18 0300 02/02/18 0733 02/02/18 0735  BP: (!) 169/117   (!) 157/112  Pulse:    88  Resp:   19 18  Temp:    (!) 97.5 F (36.4 C)  TempSrc:    Oral  SpO2:    100%  Weight:  75.3 kg (166 lb 0.1 oz)    Height:        Intake/Output Summary (Last 24 hours) at 02/02/2018 1133 Last data filed at 02/02/2018 0900 Gross per 24  hour  Intake 240 ml  Output -  Net 240 ml   Filed Weights   01/31/18 1645 02/01/18 0500 02/02/18 0300  Weight: 74.7 kg (164 lb 10.9 oz) 74.3 kg (163 lb 12.8 oz) 75.3 kg (166 lb 0.1 oz)    Examination: General exam: Appears comfortable  HEENT: PERRLA, oral mucosa moist, no sclera icterus or thrush Respiratory system: Clear to auscultation. Respiratory effort normal. Cardiovascular system: S1 & S2 heard, RRR.   Gastrointestinal system: Abdomen soft, mid abdominal tenderness, nondistended. Normal bowel sound. No organomegaly Central nervous system: Alert and oriented. No focal neurological deficits. Extremities: No cyanosis, clubbing or edema Skin: No rashes or ulcers Psychiatry:   Mood & affect appropriate.     Data Reviewed: I have personally reviewed following labs and imaging studies  CBC: Recent Labs  Lab 01/28/18 0411 01/31/18 0922 01/31/18 1742  WBC 4.3 3.6* 3.4*  HGB 11.1* 11.3* 11.1*  HCT 36.8* 36.9* 35.7*  MCV 86.4 86.2 85.4  PLT 199 126* 008*   Basic Metabolic Panel: Recent Labs  Lab 01/28/18 0411 01/31/18 0922 01/31/18 1742 02/01/18 0245  NA 139 139  --  142  K 4.2 4.0  --  5.4*  CL 99 98  --  105  CO2 24 27  --  22  GLUCOSE 88 78  --  58*  BUN 44* 29*  --  38*  CREATININE 11.53* 7.89* 8.36* 9.25*  CALCIUM 9.4 9.6  --  9.6   GFR: Estimated Creatinine Clearance: 9.8 mL/min (A) (by C-G formula based on SCr of 9.25 mg/dL (H)). Liver Function Tests: Recent Labs  Lab 01/28/18 0411 01/31/18 0922  AST 24 24  ALT 9 11  ALKPHOS 120 105  BILITOT 0.9 1.0  PROT 8.0 8.1  ALBUMIN 3.0* 3.0*   Recent Labs  Lab 01/28/18 0411 01/31/18 0922  LIPASE 88* 39   No results for input(s): AMMONIA in the last 168 hours. Coagulation Profile: No results for input(s): INR, PROTIME in the last 168 hours. Cardiac Enzymes: No results for input(s): CKTOTAL, CKMB, CKMBINDEX, TROPONINI in the last 168 hours. BNP (last 3 results) No results for input(s): PROBNP in the last 8760 hours. HbA1C: No results for input(s): HGBA1C in the last 72 hours. CBG: Recent Labs  Lab 02/01/18 1716 02/01/18 2125 02/01/18 2327 02/02/18 0735 02/02/18 0815  GLUCAP 65* 76 69* 46* 100*   Lipid Profile: No results for input(s): CHOL, HDL, LDLCALC, TRIG, CHOLHDL, LDLDIRECT in the last 72 hours. Thyroid Function Tests: No results for input(s): TSH, T4TOTAL, FREET4, T3FREE, THYROIDAB in the last 72 hours. Anemia Panel: No results for input(s): VITAMINB12, FOLATE, FERRITIN, TIBC, IRON, RETICCTPCT in the last 72 hours. Urine analysis:    Component Value Date/Time   COLORURINE YELLOW 10/18/2013 0419   APPEARANCEUR CLEAR 10/18/2013 0419   LABSPEC 1.008 10/18/2013  0419   PHURINE 8.5 (H) 10/18/2013 0419   GLUCOSEU 100 (A) 10/18/2013 0419   HGBUR TRACE (A) 10/18/2013 0419   BILIRUBINUR NEGATIVE 10/18/2013 0419   KETONESUR NEGATIVE 10/18/2013 0419   PROTEINUR 100 (A) 10/18/2013 0419   UROBILINOGEN 0.2 10/18/2013 0419   NITRITE NEGATIVE 10/18/2013 0419   LEUKOCYTESUR NEGATIVE 10/18/2013 0419   Sepsis Labs: _0 (procalcitonin:4,lacticidven:4) ) Recent Results (from the past 240 hour(s))  Blood culture (routine x 2)     Status: None (Preliminary result)   Collection Time: 01/31/18  9:25 AM  Result Value Ref Range Status   Specimen Description BLOOD RIGHT FOREARM  Final   Special Requests  Final    BOTTLES DRAWN AEROBIC AND ANAEROBIC Blood Culture results may not be optimal due to an inadequate volume of blood received in culture bottles   Culture   Final    NO GROWTH 2 DAYS Performed at [facility], [place] [address], [place], [place] [address]    Report Status PENDING  Incomplete  Blood culture (routine x 2)     Status: None (Preliminary result)   Collection Time: 01/31/18 12:50 PM  Result Value Ref Range Status   Specimen Description BLOOD RIGHT WRIST  Final   Special Requests   Final    BOTTLES DRAWN AEROBIC AND ANAEROBIC Blood Culture results may not be optimal due to an inadequate volume of blood received in culture bottles   Culture   Final    NO GROWTH 2 DAYS Performed at [facility], [place] [address] [place], [place] [address]    Report Status PENDING  Incomplete  Culture, body fluid-bottle     Status: None (Preliminary result)   Collection Time: 02/01/18  9:22 AM  Result Value Ref Range Status   Specimen Description PLEURAL RIGHT  Final   Special Requests NONE  Final   Culture   Final    NO GROWTH < 24 HOURS Performed at [facility], [place] [address] [place],  [address]    Report Status PENDING  Incomplete  Gram stain     Status: None   Collection Time: 02/01/18  9:22 AM  Result Value Ref Range  Status   Specimen Description PLEURAL RIGHT  Final   Special Requests NONE  Final   Gram Stain   Final    FEW WBC PRESENT, PREDOMINANTLY PMN NO ORGANISMS SEEN Performed at [facility], [place] [address], [place],  [address]    Report Status 02/02/2018 FINAL  Final         Radiology Studies: Ct Abdomen Pelvis Wo Contrast  Result Date: 01/31/2018 CLINICAL DATA:  Nausea and vomiting. EXAM: CT CHEST, ABDOMEN AND PELVIS WITHOUT CONTRAST TECHNIQUE: Multidetector CT imaging of the chest, abdomen and pelvis was performed following the standard protocol without IV contrast. COMPARISON:  Chest and abdominal x-rays from same day. CT abdomen pelvis dated January 25, 2018. CT chest dated November 09, 2017. FINDINGS: CT CHEST FINDINGS Cardiovascular: Unchanged moderate cardiomegaly and small pericardial effusion. Normal caliber thoracic aorta. Coronary, aortic arch, and branch vessel atherosclerotic vascular disease. Unchanged mildly enlarged main pulmonary artery measuring up to 3.4 cm. Mediastinum/Nodes: Mildly enlarged mediastinal lymph nodes appears stable to slightly decreased in size. No axillary lymphadenopathy. The thyroid gland, trachea, and esophagus demonstrate no significant findings. Lungs/Pleura: Large right pleural effusion. Faint layering ground-glass density in both upper lobes likely reflects mild pulmonary edema. Increased atelectasis in the right upper, middle, and lower lobes. Mild peribronchial thickening. No pneumothorax. No suspicious pulmonary nodule. Musculoskeletal: Diffuse sclerosis, consistent with renal osteodystrophy. No acute or significant osseous findings. Old fracture deformity of the right proximal clavicle. CT ABDOMEN PELVIS FINDINGS Hepatobiliary: No focal liver abnormality is seen. No gallstones, gallbladder wall thickening, or biliary dilatation. Pancreas: Unremarkable. No pancreatic ductal dilatation or surrounding inflammatory changes. Spleen: Normal in size without  focal abnormality. Adrenals/Urinary Tract: The adrenal glands are unremarkable. Bilateral renal atrophy again noted. Unchanged 3.3 cm thick-walled complex lesion arising from the left kidney. Unchanged small bilateral simple cysts. No renal or ureteral calculi. No hydronephrosis. 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 The bladder is unremarkable for  the degree of distention. Stomach/Bowel: Stomach is within normal limits. Appendix not definitively visualized, however there are no signs of inflammation at the base of the cecum. No evidence of bowel wall thickening, distention, or inflammatory changes. Vascular/Lymphatic: Aortic atherosclerosis. No enlarged abdominal or pelvic lymph nodes. Reproductive: Prostate is unremarkable. Other: No free fluid or pneumoperitoneum. Musculoskeletal: Diffuse sclerosis. No acute or significant osseous findings. IMPRESSION: Chest: 1. Large right pleural effusion with compressive right lung atelectasis, greatest in the right lower lobe. 2. Unchanged moderate cardiomegaly and small pericardial effusion. Faint layering ground-glass density in both upper lobes likely reflects mild pulmonary edema. 3. Unchanged mildly enlarged main pulmonary artery, consistent with pulmonary arterial hypertension. 4. Stable to slight interval decrease in size of mildly enlarged mediastinal lymph nodes, favored reactive. 5.  Aortic atherosclerosis (ICD10-I70.0). Abdomen and pelvis: 1.  No acute intra-abdominal process. 2. Unchanged 3.3 cm indeterminate left renal mass. Electronically Signed   By: Titus Dubin M.D.   On: 01/31/2018 13:59   X-ray Chest Pa And Lateral  Result Date: 02/01/2018 CLINICAL DATA:  51 year old male with recurrent right-sided pleural effusion now status post repeat right thoracentesis. EXAM: CHEST - 2 VIEW COMPARISON:  Most recent prior chest x-ray 01/31/2018 FINDINGS: No evidence of pneumothorax following right-sided thoracentesis.  Significantly reduced right-sided pleural effusion. Minimal residual pleural fluid and atelectasis. Stable cardiomegaly. Atherosclerotic calcifications are again noted in the transverse aorta. Mild vascular congestion without overt edema. No acute osseous abnormality. IMPRESSION: Status post right-sided thoracentesis without evidence of pneumothorax. Electronically Signed   By: Jacqulynn Cadet M.D.   On: 02/01/2018 10:00   Ct Chest Wo Contrast  Result Date: 01/31/2018 CLINICAL DATA:  Nausea and vomiting. EXAM: CT CHEST, ABDOMEN AND PELVIS WITHOUT CONTRAST TECHNIQUE: Multidetector CT imaging of the chest, abdomen and pelvis was performed following the standard protocol without IV contrast. COMPARISON:  Chest and abdominal x-rays from same day. CT abdomen pelvis dated January 25, 2018. CT chest dated November 09, 2017. FINDINGS: CT CHEST FINDINGS Cardiovascular: Unchanged moderate cardiomegaly and small pericardial effusion. Normal caliber thoracic aorta. Coronary, aortic arch, and branch vessel atherosclerotic vascular disease. Unchanged mildly enlarged main pulmonary artery measuring up to 3.4 cm. Mediastinum/Nodes: Mildly enlarged mediastinal lymph nodes appears stable to slightly decreased in size. No axillary lymphadenopathy. The thyroid gland, trachea, and esophagus demonstrate no significant findings. Lungs/Pleura: Large right pleural effusion. Faint layering ground-glass density in both upper lobes likely reflects mild pulmonary edema. Increased atelectasis in the right upper, middle, and lower lobes. Mild peribronchial thickening. No pneumothorax. No suspicious pulmonary nodule. Musculoskeletal: Diffuse sclerosis, consistent with renal osteodystrophy. No acute or significant osseous findings. Old fracture deformity of the right proximal clavicle. CT ABDOMEN PELVIS FINDINGS Hepatobiliary: No focal liver abnormality is seen. No gallstones, gallbladder wall thickening, or biliary dilatation. Pancreas:  Unremarkable. No pancreatic ductal dilatation or surrounding inflammatory changes. Spleen: Normal in size without focal abnormality. Adrenals/Urinary Tract: The adrenal glands are unremarkable. Bilateral renal atrophy again noted. Unchanged 3.3 cm thick-walled complex lesion arising from the left kidney. Unchanged small bilateral simple cysts. No renal or ureteral calculi. No hydronephrosis. 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 The bladder is unremarkable for the degree of distention. Stomach/Bowel: Stomach is within normal limits. Appendix not definitively visualized, however there are no signs of inflammation at the base of the cecum. No evidence of bowel wall thickening, distention, or inflammatory changes. Vascular/Lymphatic: Aortic atherosclerosis. No enlarged abdominal or pelvic lymph nodes. Reproductive: Prostate is unremarkable. Other: No free fluid  or pneumoperitoneum. Musculoskeletal: Diffuse sclerosis. No acute or significant osseous findings. IMPRESSION: Chest: 1. Large right pleural effusion with compressive right lung atelectasis, greatest in the right lower lobe. 2. Unchanged moderate cardiomegaly and small pericardial effusion. Faint layering ground-glass density in both upper lobes likely reflects mild pulmonary edema. 3. Unchanged mildly enlarged main pulmonary artery, consistent with pulmonary arterial hypertension. 4. Stable to slight interval decrease in size of mildly enlarged mediastinal lymph nodes, favored reactive. 5.  Aortic atherosclerosis (ICD10-I70.0). Abdomen and pelvis: 1.  No acute intra-abdominal process. 2. Unchanged 3.3 cm indeterminate left renal mass. Electronically Signed   By: Titus Dubin M.D.   On: 01/31/2018 13:59   US Thoracentesis Asp Pleural Space W/img Guide  Result Date: 02/01/2018 INDICATION: End-stage renal disease on hemodialysis. Shortness of breath. Right-sided pleural effusion. Request for diagnostic and therapeutic  thoracentesis. EXAM: ULTRASOUND GUIDED RIGHT THORACENTESIS MEDICATIONS: None. COMPLICATIONS: None immediate. PROCEDURE: An ultrasound guided thoracentesis was thoroughly discussed with the patient and questions answered. The benefits, risks, alternatives and complications were also discussed. The patient understands and wishes to proceed with the procedure. Written consent was obtained. Ultrasound was performed to localize and mark an adequate pocket of fluid in the right chest. The area was then prepped and draped in the normal sterile fashion. 1% Lidocaine was used for local anesthesia. Under ultrasound guidance a 6 Fr Safe-T-Centesis catheter was introduced. Thoracentesis was performed. The catheter was removed and a dressing applied. FINDINGS: A total of approximately 1.4 L of clear yellow fluid was removed. Samples were sent to the laboratory as requested by the clinical team. IMPRESSION: Successful ultrasound guided right thoracentesis yielding 1.4 L of pleural fluid. Read by: Ascencion Dike PA-C Electronically Signed   By: Jacqulynn Cadet M.D.   On: 02/01/2018 10:33      Scheduled Meds: . Chlorhexidine Gluconate Cloth  6 each Topical Q0600  . cinacalcet  90 mg Oral Q supper  . cyclobenzaprine  5 mg Oral TID  . diclofenac sodium  4 g Topical QID  . docusate sodium  100 mg Oral BID  . doxercalciferol  7 mcg Intravenous Q M,W,F-HD  . enoxaparin (LOVENOX) injection  30 mg Subcutaneous Q24H  . hydrocerin   Topical BID  . insulin aspart  0-9 Units Subcutaneous TID WC  . lisinopril  5 mg Oral Daily  . ondansetron (ZOFRAN) IV  4 mg Intravenous Q4H  . pantoprazole  40 mg Oral Daily  . sevelamer carbonate  1,600 mg Oral TID WC   Continuous Infusions:   LOS: 0 days    Time spent in minutes: 35    Debbe Odea, MD [facility] Pager: [email or web address] Password [facility] 02/02/2018, 11:33 AM

## 2018-02-03 ENCOUNTER — Inpatient Hospital Stay (HOSPITAL_COMMUNITY): Payer: Medicare Other

## 2018-02-03 LAB — GLUCOSE, CAPILLARY
GLUCOSE-CAPILLARY: 59 mg/dL — AB (ref 70–99)
GLUCOSE-CAPILLARY: 59 mg/dL — AB (ref 70–99)
GLUCOSE-CAPILLARY: 70 mg/dL (ref 70–99)
Glucose-Capillary: 99 mg/dL (ref 70–99)
Glucose-Capillary: 55 mg/dL — ABNORMAL LOW (ref 70–99)
Glucose-Capillary: 78 mg/dL (ref 70–99)
Glucose-Capillary: 93 mg/dL (ref 70–99)

## 2018-02-03 MED ORDER — CAMPHOR-MENTHOL 0.5-0.5 % EX LOTN
TOPICAL_LOTION | CUTANEOUS | Status: DC | PRN
  Administered 2018-02-03 – 2018-02-04 (×2): 1 via TOPICAL
  Filled 2018-02-03: qty 222

## 2018-02-03 MED ORDER — CHLORHEXIDINE GLUCONATE CLOTH 2 % EX PADS
6.0000 | MEDICATED_PAD | Freq: Every day | CUTANEOUS | Status: DC
  Administered 2018-02-04: 6 via TOPICAL

## 2018-02-03 MED ORDER — DIPHENHYDRAMINE HCL 25 MG PO CAPS
25.0000 mg | ORAL_CAPSULE | Freq: Four times a day (QID) | ORAL | Status: DC | PRN
  Administered 2018-02-03: 25 mg via ORAL
  Filled 2018-02-03: qty 1

## 2018-02-03 NOTE — Progress Notes (Addendum)
Patient not will to restart another IV to go to CT for both IV and oral contrast.  Patient was under the impression it was just oral not both and IV was positional and able to flush with medications.  IV team consult cancelled and will put in for later this am.  Patient is refusing to go back to CT tonight because he was supposedly told it would last 24 hours.  Called CT and verified it would not be good after 4 hours more.  Patient did take from 7/15 2100 to drink contrast which arrived at 1900.  And finished his contrast around 7/16 130 even with medications and encouragement.

## 2018-02-03 NOTE — Progress Notes (Signed)
Admit: 01/31/2018 LOS: 1  76M ESRD admit with N/V/Abd pain, SOB and R pleural effusion  Subjective:  . HD yesterday: 2.5L UF, post weight 72.5kg . Seen by GI for chornic pancreatitis, notes reviewed . To have CT A/P with IV/PO contrast . No complaints this AM  07/15 0701 - 07/16 0700 In: 1365 [P.O.:1365] Out: 2500   Filed Weights   02/02/18 1425 02/02/18 1831 02/03/18 0253  Weight: 75.3 kg (166 lb 0.1 oz) 72.5 kg (159 lb 13.3 oz) 73 kg (160 lb 15 oz)    Scheduled Meds: . Chlorhexidine Gluconate Cloth  6 each Topical Q0600  . cinacalcet  90 mg Oral Q supper  . cyclobenzaprine  5 mg Oral TID  . diclofenac sodium  4 g Topical QID  . docusate sodium  100 mg Oral BID  . doxercalciferol  7 mcg Intravenous Q M,W,F-HD  . enoxaparin (LOVENOX) injection  30 mg Subcutaneous Q24H  . hydrocerin   Topical BID  . insulin aspart  0-9 Units Subcutaneous TID WC  . lisinopril  5 mg Oral Daily  . ondansetron (ZOFRAN) IV  4 mg Intravenous Q4H  . pantoprazole  40 mg Oral Daily  . sevelamer carbonate  1,600 mg Oral TID WC   Continuous Infusions: PRN Meds:.gi cocktail, HYDROmorphone (DILAUDID) injection, polyethylene glycol, traMADol  Current Labs: reviewed    Physical Exam:  Blood pressure (!) 149/104, pulse 85, temperature 97.6 F (36.4 C), temperature source Oral, resp. rate 20, height _0  (1.88 m), weight 73 kg (160 lb 15 oz), SpO2 91 %. General: NAD,  Walking aroudn room Head: NCAT, EOMI Lungs: nl wob, scattered rhonci in bases, symmetricl, clear in upper fields Heart: RRR. No murmur, rubs or gallops.  Abdomen: s/nt/nd Lower extremities:trace edema b/l, +dry skin,  Neuro: AAOx3. Dialysis Access: LU AVF  Dialysis Orders:  MWF - [facility]  4hrs, BFR 400, DFR 800,  EDW 73kg, 2K/ 2.25 Ca  Access: LU AVF  Heparin None Mircera 225 mcg q2wks - last 7/3 Hectorol 45mg IV qHD   venofer [id number] - completed 4 of 10 venofer [id number]   A 1. R pleural effusion s/p 1.4L  thoracentesis 7/14, per primary 2. ESRD MWF [facility] 3. HTN/ Vol: likely hypervolemia contributing 4. Anemia at goal 5. 2HPTH 6. N/V/Abd pain; chronic pancreatitis, GI following for CT A/P  P . Cont HD on schedule, probe post weights down past EDW as able, try for 71.5kg tomorrow post.  . Medication Issues; o Preferred narcotic agents for pain control are hydromorphone, fentanyl, and methadone. Morphine should not be used.  o Baclofen should be avoided o Avoid oral sodium phosphate and magnesium citrate based laxatives / bowel preps    RyaPearson Grippe 02/03/2018, 9:03 AM  Recent Labs  Lab 01/31/18 0922 01/31/18 1742 02/01/18 0245 02/02/18 1442  NA 139  --  142 136  K 4.0  --  5.4* 5.0  CL 98  --  105 99  CO2 27  --  22 23  GLUCOSE 78  --  58* 110*  BUN 29*  --  38* 46*  CREATININE 7.89* 8.36* 9.25* 11.54*  CALCIUM 9.6  --  9.6 8.7*  PHOS  --   --   --  8.6*   Recent Labs  Lab 01/31/18 0922 01/31/18 1742 02/02/18 1442  WBC 3.6* 3.4* 3.2*  HGB 11.3* 11.1* 11.0*  HCT 36.9* 35.7* 34.9*  MCV 86.2 85.4 85.1  PLT 126* 131* 123*

## 2018-02-03 NOTE — Progress Notes (Signed)
Pt going down now for CT with contrast to evaluate the pancreas.When presented with info from [facility] he now admitts that he has gone to [facility] for multiple visits with EUSs and pseudocyst drainage and placement of pancreatic stents. He became upset with [facility] for unclear reasons and then went to [facility] and [facility] and had the pancreatic stent removed. Told him that we would see what the new CT showed and then talk about where he should go for F/U of his pancreas problems. I feel it would be best is he was followed at a medical center that has expertise in complex pancreatic problems. Will check CT results tomorrow.

## 2018-02-03 NOTE — Progress Notes (Signed)
Patient having hard time drinking contrast with experiencing on-going nausea.  Medications were given with so significant relief.  CT called and told he drank all but a 1/4 of second bottle.  Transport called to take by CT department

## 2018-02-03 NOTE — Plan of Care (Signed)
Discussed with patient plan of care, pain management and new skin cream for itching with some teach back displayed

## 2018-02-03 NOTE — Progress Notes (Signed)
PROGRESS NOTE    Frank [name]  BHA:[id number] DOB: Jan 04, 1966 DOA: 01/31/2018 PCP: Patient, [name]   Brief Narrative:  year-old with a history of hypertension, diabetes mellitus type 2 which is controlled with diet, DVT and pulmonary embolism on Eliquis, depression, CHF with ejection fraction 35%, ESRD on HD Monday Wednesday Friday came to the hospital complains of nonspecific abdominal pain, nausea and vomiting.  He was found to have recurrent right-sided pleural effusion.   Assessment & Plan:   Principal Problem:   Recurrent right pleural effusion Active Problems:   Anemia of chronic kidney failure   Nausea & vomiting   DM (diabetes mellitus), type 2, uncontrolled, with renal complications (HCC)   Essential hypertension   Hypoxemia  Recurrent right-sided pleural effusion status post thoracentesis -Patient underwent thoracentesis yesterday went from 1.4 L of fluid was removed.  His breathing symptoms appear to be better.  Closely monitor this.  Nonspecific abdominal pain, nausea - Recent history of small bowel obstruction.  CT of the abdomen pelvis with contrast has been ordered.  Patient has been off and on refusing to drink any contrast or get IV contrast.  This morning is agreed therefore will obtain this later.  Appreciate gastroenterology input.  Continue supportive care.  Antiemetics as needed  ESRD on hemodialysis Monday/Wednesday/Friday -Nephrology is following.  Dialysis plan according to nephrology. -Continue Sensipar, Hectorol and Renvela  Anemia of chronic disease -Secondary to his ESRD.  Closely monitor hemoglobin.  Essential hypertension -Continue lisinopril 35m  Diabetes mellitus type 2, diet controlled -Sliding scale is in place.    DVT prophylaxis: Lovenox Code Status: Full code Family Communication: None at bedside Disposition Plan: Still to be determined  Consultants:   Gastroenterology  Nephrology  Procedures:   Ultrasound-guided  thoracentesis on the right-7/14  Antimicrobials:   None   Subjective: No acute complaints.  Appears he did not want to drink his oral contrast overnight but finally ended up drinking it.  Review of Systems Otherwise negative except as per HPI, including: General: Denies fever, chills, night sweats or unintended weight loss. Resp: Denies cough, wheezing, shortness of breath. Cardiac: Denies chest pain, palpitations, orthopnea, paroxysmal nocturnal dyspnea. GI: Denies abdominal pain, nausea, vomiting, diarrhea or constipation GU: Denies dysuria, frequency, hesitancy or incontinence MS: Denies muscle aches, joint pain or swelling Neuro: Denies headache, neurologic deficits (focal weakness, numbness, tingling), abnormal gait Psych: Denies anxiety, depression, SI/HI/AVH Skin: Denies new rashes or lesions ID: Denies sick contacts, exotic exposures, travel  Objective: Vitals:   02/02/18 1945 02/02/18 2333 02/03/18 0253 02/03/18 0803  BP: (!) 162/126 (!) 140/103  (!) 149/104  Pulse: (!) 102 94  85  Resp: _0 Temp: 98.2 F (36.8 C) 97.7 F (36.5 C)  97.6 F (36.4 C)  TempSrc: Oral Oral  Oral  SpO2: 95% 100%  91%  Weight:   73 kg (160 lb 15 oz)   Height:        Intake/Output Summary (Last 24 hours) at 02/03/2018 0928 Last data filed at 02/03/2018 0700 Gross per 24 hour  Intake 1125 ml  Output 2500 ml  Net -1375 ml   Filed Weights   02/02/18 1425 02/02/18 1831 02/03/18 0253  Weight: 75.3 kg (166 lb 0.1 oz) 72.5 kg (159 lb 13.3 oz) 73 kg (160 lb 15 oz)    Examination:  General exam: Appears calm and comfortable  Respiratory system: Clear to auscultation. Respiratory effort normal. Cardiovascular system: S1 & S2 heard, RRR. No JVD,  murmurs, rubs, gallops or clicks. No pedal edema. Gastrointestinal system: Abdomen is nondistended, soft and nontender. No organomegaly or masses felt. Normal bowel sounds heard. Central nervous system: Alert and oriented. No focal  neurological deficits. Extremities: Symmetric 5 x 5 power. Skin: No rashes, lesions or ulcers Psychiatry: Judgement and insight appear normal. Mood & affect appropriate.     Data Reviewed:   CBC: Recent Labs  Lab 01/28/18 0411 01/31/18 0922 01/31/18 1742 02/02/18 1442  WBC 4.3 3.6* 3.4* 3.2*  HGB 11.1* 11.3* 11.1* 11.0*  HCT 36.8* 36.9* 35.7* 34.9*  MCV 86.4 86.2 85.4 85.1  PLT 199 126* 131* 268*   Basic Metabolic Panel: Recent Labs  Lab 01/28/18 0411 01/31/18 0922 01/31/18 1742 02/01/18 0245 02/02/18 1442  NA 139 139  --  142 136  K 4.2 4.0  --  5.4* 5.0  CL 99 98  --  105 99  CO2 24 27  --  22 23  GLUCOSE 88 78  --  58* 110*  BUN 44* 29*  --  38* 46*  CREATININE 11.53* 7.89* 8.36* 9.25* 11.54*  CALCIUM 9.4 9.6  --  9.6 8.7*  PHOS  --   --   --   --  8.6*   GFR: Estimated Creatinine Clearance: 7.6 mL/min (A) (by C-G formula based on SCr of 11.54 mg/dL (H)). Liver Function Tests: Recent Labs  Lab 01/28/18 0411 01/31/18 0922 02/02/18 1442  AST 24 24  --   ALT 9 11  --   ALKPHOS 120 105  --   BILITOT 0.9 1.0  --   PROT 8.0 8.1  --   ALBUMIN 3.0* 3.0* 2.7*   Recent Labs  Lab 01/28/18 0411 01/31/18 0922 02/02/18 1442  LIPASE 88* 39 22   No results for input(s): AMMONIA in the last 168 hours. Coagulation Profile: No results for input(s): INR, PROTIME in the last 168 hours. Cardiac Enzymes: No results for input(s): CKTOTAL, CKMB, CKMBINDEX, TROPONINI in the last 168 hours. BNP (last 3 results) No results for input(s): PROBNP in the last 8760 hours. HbA1C: No results for input(s): HGBA1C in the last 72 hours. CBG: Recent Labs  Lab 02/02/18 0815 02/02/18 1204 02/02/18 1827 02/02/18 2329 02/03/18 0759  GLUCAP 100* 78 77 79 59*   Lipid Profile: No results for input(s): CHOL, HDL, LDLCALC, TRIG, CHOLHDL, LDLDIRECT in the last 72 hours. Thyroid Function Tests: No results for input(s): TSH, T4TOTAL, FREET4, T3FREE, THYROIDAB in the last 72  hours. Anemia Panel: No results for input(s): VITAMINB12, FOLATE, FERRITIN, TIBC, IRON, RETICCTPCT in the last 72 hours. Sepsis Labs: Recent Labs  Lab 01/31/18 0949  LATICACIDVEN 1.87    Recent Results (from the past 240 hour(s))  Blood culture (routine x 2)     Status: None (Preliminary result)   Collection Time: 01/31/18  9:25 AM  Result Value Ref Range Status   Specimen Description BLOOD RIGHT FOREARM  Final   Special Requests   Final    BOTTLES DRAWN AEROBIC AND ANAEROBIC Blood Culture results may not be optimal due to an inadequate volume of blood received in culture bottles   Culture   Final    NO GROWTH 2 DAYS Performed at [facility], [place] [address], [place], [place] [address]    Report Status PENDING  Incomplete  Blood culture (routine x 2)     Status: None (Preliminary result)   Collection Time: 01/31/18 12:50 PM  Result Value Ref Range Status   Specimen Description BLOOD  RIGHT WRIST  Final   Special Requests   Final    BOTTLES DRAWN AEROBIC AND ANAEROBIC Blood Culture results may not be optimal due to an inadequate volume of blood received in culture bottles   Culture   Final    NO GROWTH 2 DAYS Performed at [facility], [place] [address] [place], [place] [address]    Report Status PENDING  Incomplete  Culture, body fluid-bottle     Status: None (Preliminary result)   Collection Time: 02/01/18  9:22 AM  Result Value Ref Range Status   Specimen Description PLEURAL RIGHT  Final   Special Requests NONE  Final   Culture   Final    NO GROWTH < 24 HOURS Performed at [facility], [place] [address], [place], [place] [address]    Report Status PENDING  Incomplete  Gram stain     Status: None   Collection Time: 02/01/18  9:22 AM  Result Value Ref Range Status   Specimen Description PLEURAL RIGHT  Final   Special Requests NONE  Final   Gram Stain   Final    FEW WBC PRESENT, PREDOMINANTLY PMN NO ORGANISMS SEEN Performed at [facility],  [place] [address], [place], [place] [address]    Report Status 02/02/2018 FINAL  Final         Radiology Studies: No results found.      Scheduled Meds: . Chlorhexidine Gluconate Cloth  6 each Topical Q0600  . cinacalcet  90 mg Oral Q supper  . cyclobenzaprine  5 mg Oral TID  . diclofenac sodium  4 g Topical QID  . docusate sodium  100 mg Oral BID  . doxercalciferol  7 mcg Intravenous Q M,W,F-HD  . enoxaparin (LOVENOX) injection  30 mg Subcutaneous Q24H  . hydrocerin   Topical BID  . insulin aspart  0-9 Units Subcutaneous TID WC  . lisinopril  5 mg Oral Daily  . ondansetron (ZOFRAN) IV  4 mg Intravenous Q4H  . pantoprazole  40 mg Oral Daily  . sevelamer carbonate  1,600 mg Oral TID WC   Continuous Infusions:   LOS: 1 day    I have spent 25 minutes face to face with the patient and on the ward discussing the patients care, assessment, plan and disposition with other care givers. >50% of the time was devoted counseling the patient about the risks and benefits of treatment and coordinating care.     Keanna Tugwell Arsenio Loader, MD [facility] Pager [phone]   If 7PM-7AM, please contact night-coverage [email or web address] Password [facility] 02/03/2018, 9:28 AM

## 2018-02-04 ENCOUNTER — Inpatient Hospital Stay (HOSPITAL_COMMUNITY): Payer: Medicare Other

## 2018-02-04 ENCOUNTER — Encounter (HOSPITAL_COMMUNITY): Payer: Self-pay | Admitting: Radiology

## 2018-02-04 DIAGNOSIS — N185 Chronic kidney disease, stage 5: Secondary | ICD-10-CM

## 2018-02-04 DIAGNOSIS — R0902 Hypoxemia: Secondary | ICD-10-CM

## 2018-02-04 DIAGNOSIS — D631 Anemia in chronic kidney disease: Secondary | ICD-10-CM

## 2018-02-04 DIAGNOSIS — E1165 Type 2 diabetes mellitus with hyperglycemia: Secondary | ICD-10-CM

## 2018-02-04 DIAGNOSIS — J90 Pleural effusion, not elsewhere classified: Principal | ICD-10-CM

## 2018-02-04 DIAGNOSIS — I10 Essential (primary) hypertension: Secondary | ICD-10-CM

## 2018-02-04 DIAGNOSIS — E1129 Type 2 diabetes mellitus with other diabetic kidney complication: Secondary | ICD-10-CM

## 2018-02-04 DIAGNOSIS — R112 Nausea with vomiting, unspecified: Secondary | ICD-10-CM

## 2018-02-04 LAB — COMPREHENSIVE METABOLIC PANEL
ALT: 11 U/L (ref 0–44)
ANION GAP: 17 — AB (ref 5–15)
AST: 30 U/L (ref 15–41)
Albumin: 3.1 g/dL — ABNORMAL LOW (ref 3.5–5.0)
Alkaline Phosphatase: 77 U/L (ref 38–126)
BUN: 25 mg/dL — ABNORMAL HIGH (ref 6–20)
CHLORIDE: 96 mmol/L — AB (ref 98–111)
CO2: 25 mmol/L (ref 22–32)
Calcium: 8.3 mg/dL — ABNORMAL LOW (ref 8.9–10.3)
Creatinine, Ser: 9.31 mg/dL — ABNORMAL HIGH (ref 0.61–1.24)
GFR calc non Af Amer: 6 mL/min — ABNORMAL LOW (ref >60)
GFR, EST AFRICAN AMERICAN: 7 mL/min — AB (ref >60)
Glucose, Bld: 65 mg/dL — ABNORMAL LOW (ref 70–99)
POTASSIUM: 5.2 mmol/L — AB (ref 3.5–5.1)
SODIUM: 138 mmol/L (ref 135–145)
Total Bilirubin: 1.4 mg/dL — ABNORMAL HIGH (ref 0.3–1.2)
Total Protein: 8.3 g/dL — ABNORMAL HIGH (ref 6.5–8.1)

## 2018-02-04 LAB — CBC
HCT: 36 % — ABNORMAL LOW (ref 39.0–52.0)
Hemoglobin: 11.1 g/dL — ABNORMAL LOW (ref 13.0–17.0)
MCH: 26.5 pg (ref 26.0–34.0)
MCHC: 30.8 g/dL (ref 30.0–36.0)
MCV: 85.9 fL (ref 78.0–100.0)
PLATELETS: 137 10*3/uL — AB (ref 150–400)
RBC: 4.19 MIL/uL — AB (ref 4.22–5.81)
RDW: 18.5 % — ABNORMAL HIGH (ref 11.5–15.5)
WBC: 3.2 10*3/uL — ABNORMAL LOW (ref 4.0–10.5)

## 2018-02-04 LAB — MAGNESIUM: Magnesium: 2.1 mg/dL (ref 1.7–2.4)

## 2018-02-04 LAB — GLUCOSE, CAPILLARY
GLUCOSE-CAPILLARY: 73 mg/dL (ref 70–99)
GLUCOSE-CAPILLARY: 52 mg/dL — AB (ref 70–99)
GLUCOSE-CAPILLARY: 62 mg/dL — AB (ref 70–99)
Glucose-Capillary: 63 mg/dL — ABNORMAL LOW (ref 70–99)
Glucose-Capillary: 69 mg/dL — ABNORMAL LOW (ref 70–99)
Glucose-Capillary: 78 mg/dL (ref 70–99)
Glucose-Capillary: 89 mg/dL (ref 70–99)

## 2018-02-04 IMAGING — CR DG CHEST 2V
2 series · 2 of 2 positions shown · non-contrast
Comparison: Chest radiograph performed 09/08/2015

CLINICAL DATA: Acute onset of generalized chest pain, nausea and
vomiting. Initial encounter.

EXAM:
CHEST  2 VIEW

[chest lat]
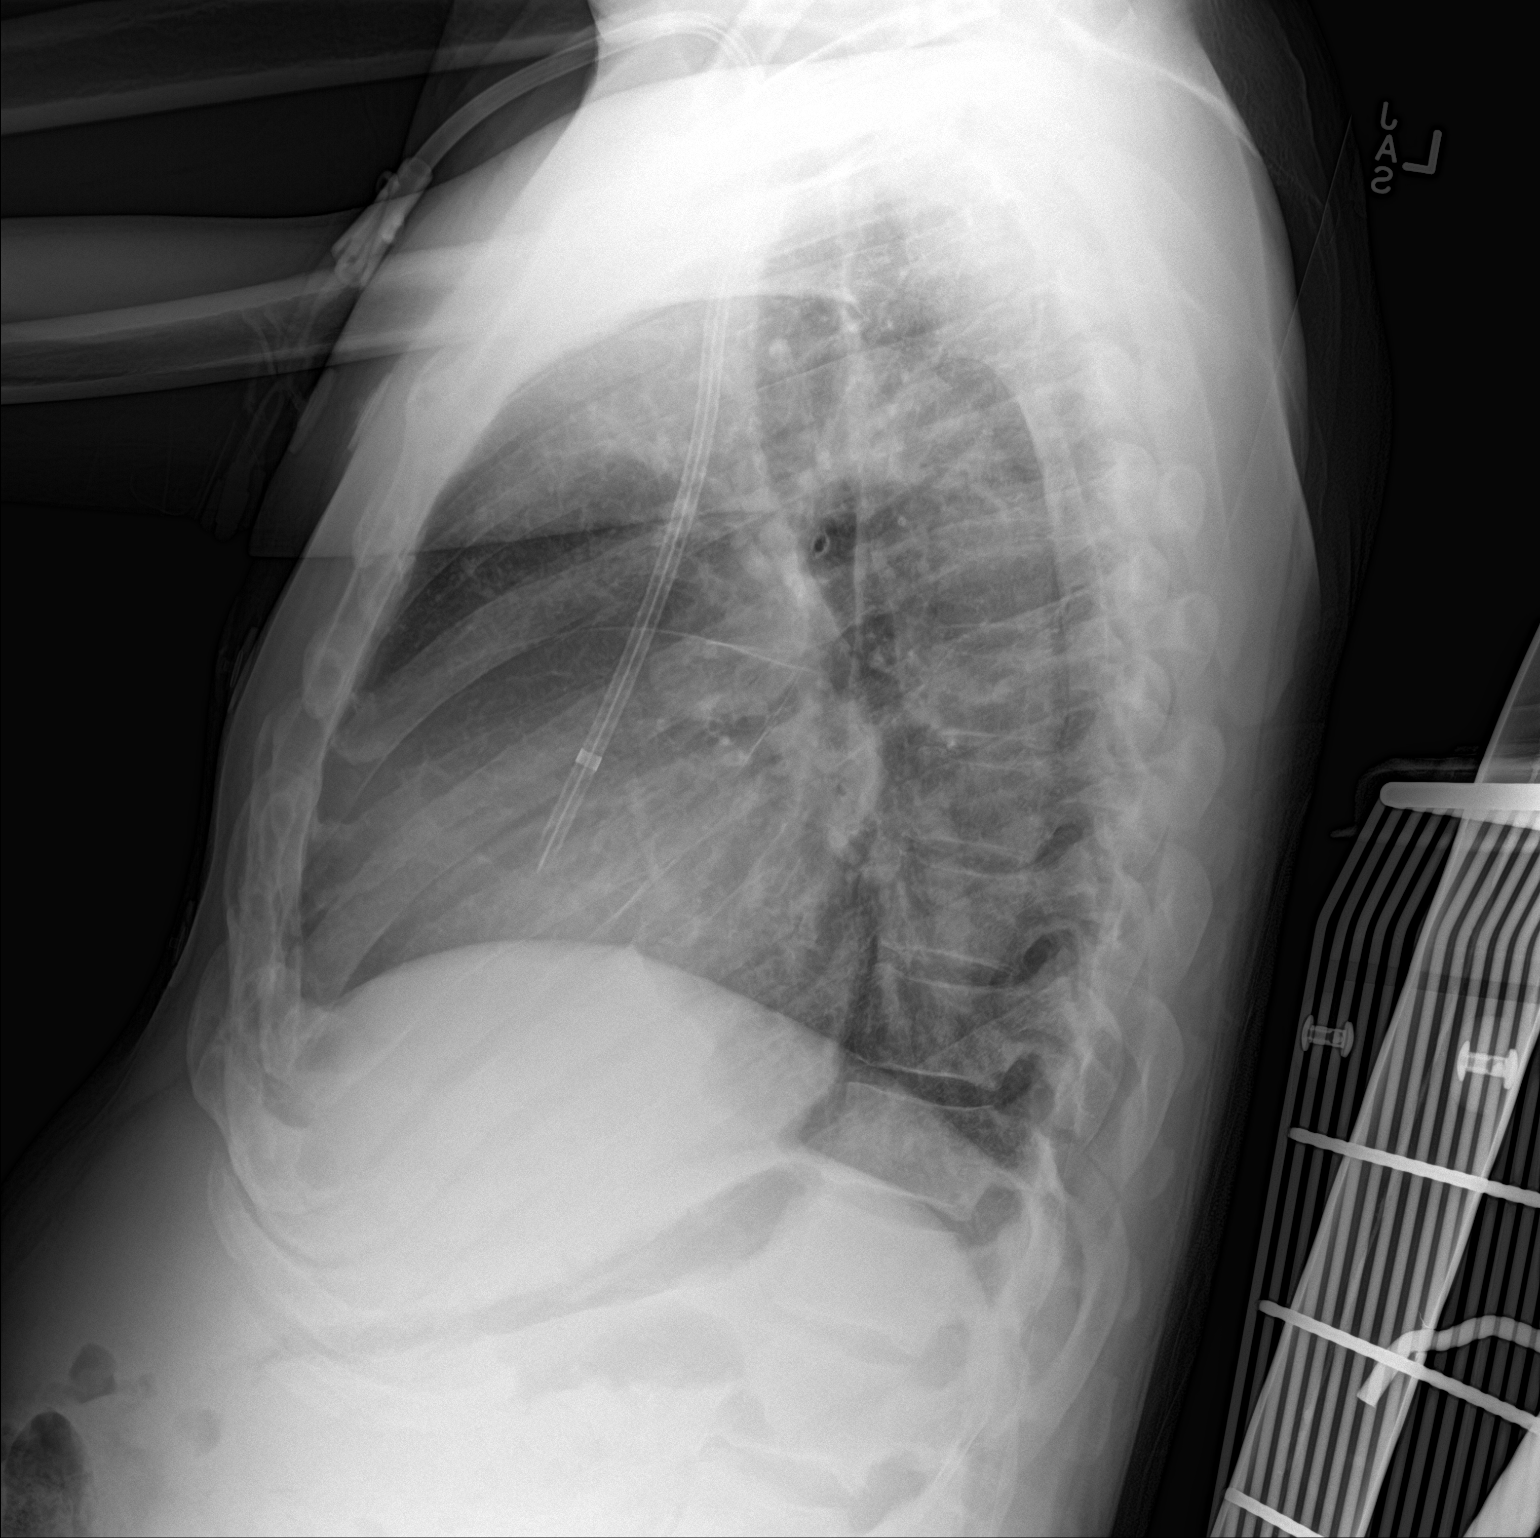

[chest ap]
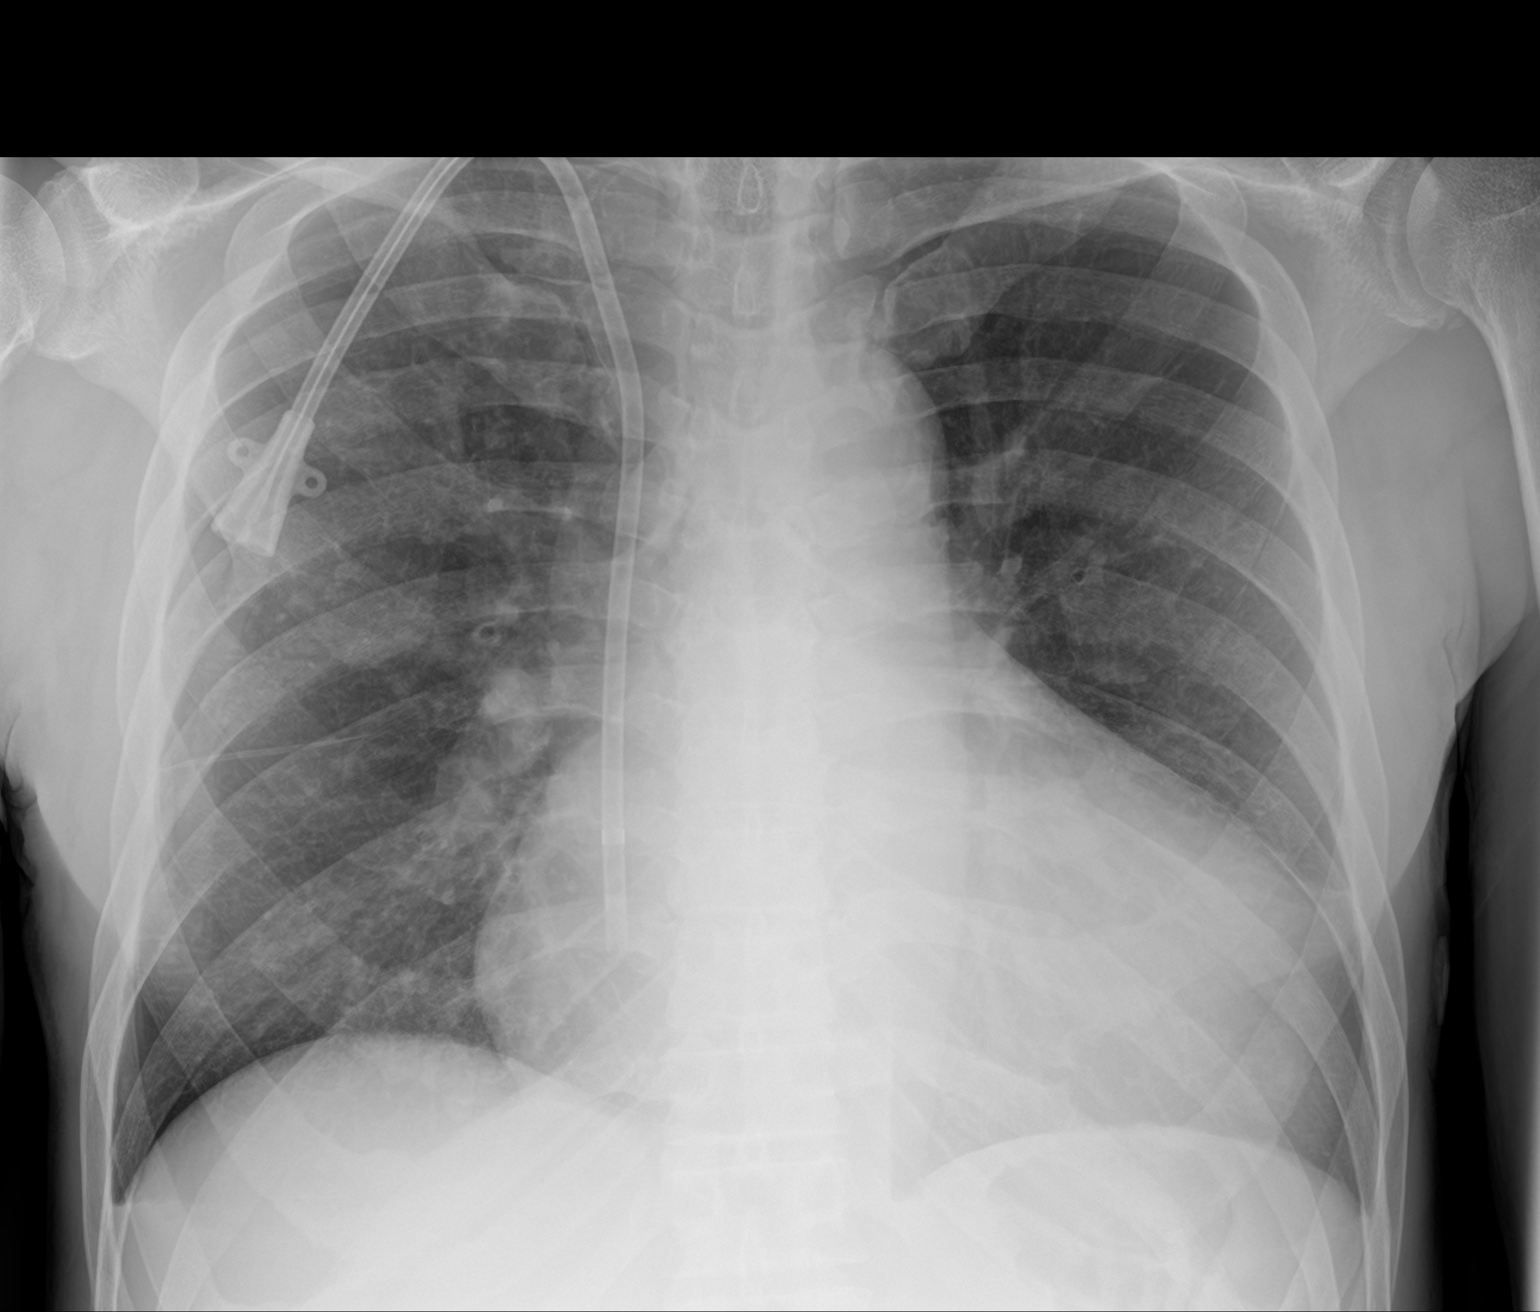

[2 of 2 positions shown; findings below may reference images not displayed]

FINDINGS: The lungs are well-aerated. Vascular congestion is noted. Mild
right-sided and left basilar airspace opacities could reflect mild
interstitial edema or pneumonia. There is no evidence of pleural
effusion or pneumothorax.

The heart is enlarged. A right-sided dual-lumen catheter is noted
ending at the right atrium. No acute osseous abnormalities are seen.
IMPRESSION: Vascular congestion and cardiomegaly. Mild right-sided and left
basilar airspace opacities could reflect mild interstitial edema or
pneumonia.

## 2018-02-04 MED ORDER — PROMETHAZINE HCL 25 MG/ML IJ SOLN
12.5000 mg | Freq: Once | INTRAMUSCULAR | Status: AC
  Administered 2018-02-04: 12.5 mg via INTRAVENOUS
  Filled 2018-02-04: qty 1

## 2018-02-04 MED ORDER — IOPAMIDOL (ISOVUE-370) INJECTION 76%
INTRAVENOUS | Status: AC
  Filled 2018-02-04: qty 100

## 2018-02-04 MED ORDER — HYDROMORPHONE HCL 1 MG/ML IJ SOLN
0.5000 mg | Freq: Once | INTRAMUSCULAR | Status: AC
  Administered 2018-02-04: 0.5 mg via INTRAVENOUS
  Filled 2018-02-04: qty 1

## 2018-02-04 MED ORDER — APIXABAN 5 MG PO TABS
5.0000 mg | ORAL_TABLET | Freq: Two times a day (BID) | ORAL | Status: DC
  Administered 2018-02-04 – 2018-02-05 (×3): 5 mg via ORAL
  Filled 2018-02-04 (×3): qty 1

## 2018-02-04 MED ORDER — IOPAMIDOL (ISOVUE-300) INJECTION 61%
INTRAVENOUS | Status: AC
  Filled 2018-02-04: qty 30

## 2018-02-04 MED ORDER — DEXTROSE 50 % IV SOLN
12.5000 g | Freq: Once | INTRAVENOUS | Status: AC
  Administered 2018-02-04: 12.5 g via INTRAVENOUS
  Filled 2018-02-04: qty 50

## 2018-02-04 MED ORDER — IOHEXOL 300 MG/ML  SOLN
100.0000 mL | Freq: Once | INTRAMUSCULAR | Status: AC | PRN
  Administered 2018-02-04: 100 mL via INTRAVENOUS

## 2018-02-04 NOTE — Progress Notes (Signed)
Hemodialysis canceled for today by Dr Joelyn Oms. Rescheduled for Thursday, February 05, 2018 Patient and patient's nurse Trenda Moots, RN notified of schedule change at 17:10 Orders written for today will be used for next scheduled hemodialysis treatment unless a new order is written.

## 2018-02-04 NOTE — Progress Notes (Signed)
Pt's CBG checked routinely at 1630 and was 52. Pt alert and oriented. RN notified MD and 12.5 g dextrose ordered. RN notified CT transport of possible delay.   When RN attempted to administer dextrose through IV - IV started to leak. RN notified CT of delay. IV team placed new site - RN completed administering rest of dextrose dose.   RN notified CT of pt's hypoglycemia, dextrose administration, and new IV site. Pt will be continued to be monitored. Once back on 2W, CBG will be checked.

## 2018-02-04 NOTE — Progress Notes (Signed)
PROGRESS NOTE    Frank [name]  YYQ:[id number] DOB: 1965-10-15 DOA: 01/31/2018 PCP: Patient, [name]      Brief Narrative:  Frank [name] is a 52 y.o. M with ESRD on HD MWF, HTN, DM, and VTE on Eliquis, CHF EF 35% and chronic pancreatitis with pseudocyst who presents with severe constant abdominal pain, N/V and found incidentally to have a pleural effusion again.   Assessment & Plan:  Recurrent right-sided pleural effusion Thoracentesis at [facility] in June was transudative.  1.4L taken off this hospitalization, no chemistries sent.  No chest pain or dyspnea today. -Repeat CXR   Chronic pancreatitis Pancreatic pseudocyst Some of patient's previous notes suggest that he has "chronic abdominal pain" others do not.  Some suggest that he has "chronic pancreatitis", although I see no admissions for pancreatitis until the end of 2018.   In Jan 2019, imaging showed enlarging pancreatic pseudocyst.   In Feb he was transferred to [facility] for EUS, where the wall of the cyst was too imature to drain. In March he was readmitted to [facility], and this time underwent cystogastrostomy with [facility] stent placement.  There was pneumoperitoneum that was treated conservatively (and resolved with serial imaging, no intervention).  He had poor drainage of the cyst, which persisted despite repositioning stent, and then ultimately he had a percutaneous drain placed.  This grew MRSA, and he was started on Vancomycin, which was intented to be continued for six weeks with HD (at the time of discharge Apr 7) Early June, patient was admitted at [facility] for chest pain from fluid overload causing reaccumulation of his pleural effusion, and at that time it was determined that he had never had the stent removed from March (discharge summary at that time says it should have been removed in mid April).   Repeat CT imaging at that time showed no more fluid collection (collapsed pseudocyst), no active pancreatitis.   He had the [facility] stent removed. CT imaging without contrast here was unremarkable.  Repeat CT with contrast was ordered 2 days ago, has not been obtained yet. -Obtain CT abdomen -Consult to GI, appreciate cares -Continue IV hydromorphone 1 mg every 4 hours as needed -Continue tramadol 100 mg every 12 hours as needed -Continue Phenergan as needed  ESRD Generally noncompliant.  Frequent admissions for fluid overload or hyperkalemia. -Consult nephrology for HD  Anemia of chronic renal disease Stable relative to baseline.  Hypertension Poorly controlled. -Continue lisinopril  Recurrent VTE Apixaban appears to have been continued on admission?  Held for thoracentesis? -Restart Eliquis  Diabetes Glucose is low -SSI as needed  Chronic systolic CHF EF 88%.  Appears euvolemic. -Volume control with dialysis -Continue lisinopril  Other medications -Continue PPI -Continue Flexeril   Left renal mass This needs follow up.  Incidentally, his back pain is primarily the left flank. -CT abdomen     DVT prophylaxis: N/A Code Status: FULL Family Communication: None present MDM and disposition Plan: The below labs and imaging reports were reviewed and summarized above.    The patient was admitted with abdominal pain and vomiting.  Unclear if his pseudoscyst has returned.  We are for some reason still awaiting the CT abdomen.  Also, pleural effusion, now tapped, without improvement in symptoms.  Will obtain a CT abdomen today.  If negative for GI pathology, will obtain Urology consultation.  If positive for GI pathology, may need transfer to [facility] again.   Consultants:   GI  Nephrology  Procedures:  None  Antimicrobials:   None    Subjective: Still with severe,unchanged, constant left UQ and left flank pain.  Appetite returning, no vomiting.  No fever, confusion.  No cough, sputum, dyspne4a.  Objective: Vitals:   02/03/18 1559 02/03/18 2124 02/04/18 0342 02/04/18  0752  BP: (!) 131/104 (!) 140/97  (!) 163/118  Pulse: 86 90  94  Resp: _0 Temp: (!) 97.5 F (36.4 C) (!) 97.4 F (36.3 C)    TempSrc: Oral Oral    SpO2: 100% 91%  92%  Weight:   77.8 kg (171 lb 8.3 oz)   Height:        Intake/Output Summary (Last 24 hours) at 02/04/2018 1510 Last data filed at 02/04/2018 0319 Gross per 24 hour  Intake 420 ml  Output 0 ml  Net 420 ml   Filed Weights   02/02/18 1831 02/03/18 0253 02/04/18 0342  Weight: 72.5 kg (159 lb 13.3 oz) 73 kg (160 lb 15 oz) 77.8 kg (171 lb 8.3 oz)    Examination: General appearance: Thin adult male, alert and in moderate distress from pain, lying in bed.   HEENT: Anicteric, conjunctiva pink, lids and lashes normal. No nasal deformity, discharge, epistaxis.  Lips moist, teeth normal, OP moist, no oral lesions, hearing normal.   Skin: Warm and dry.   No suspicious rashes or lesions.  Cardiac: RRR, nl S1-S2, no murmurs appreciated.     JVP normal.  No LE edema.  Radia  pulses 2+ and symmetric. Respiratory: Normal respiratory rate and rhythm.  CTAB without rales or wheezes. Abdomen: Abdomen with mostly epigastric and LUQ TTP, with voluntary guarding. No ascites, distension, cannot examin for  hepatosplenomegaly.  No rigidity, rebound. MSK: No deformities or effusions in the large joints of the upper or lwower extremities bilaterally. Neuro: Awake and alert.  EOMI, moves all extremities. Speech fluent.    Psych: Sensorium intact and responding to questions, attention normal. Affect normal.  Judgment and insight appear normal.    Data Reviewed: I have personally reviewed following labs and imaging studies:  CBC: Recent Labs  Lab 01/31/18 0922 01/31/18 1742 02/02/18 1442 02/04/18 0258  WBC 3.6* 3.4* 3.2* 3.2*  HGB 11.3* 11.1* 11.0* 11.1*  HCT 36.9* 35.7* 34.9* 36.0*  MCV 86.2 85.4 85.1 85.9  PLT 126* 131* 123* 016*   Basic Metabolic Panel: Recent Labs  Lab 01/31/18 0922 01/31/18 1742 02/01/18 0245  02/02/18 1442  NA 139  --  142 136  K 4.0  --  5.4* 5.0  CL 98  --  105 99  CO2 27  --  22 23  GLUCOSE 78  --  58* 110*  BUN 29*  --  38* 46*  CREATININE 7.89* 8.36* 9.25* 11.54*  CALCIUM 9.6  --  9.6 8.7*  PHOS  --   --   --  8.6*   GFR: Estimated Creatinine Clearance: 8.1 mL/min (A) (by C-G formula based on SCr of 11.54 mg/dL (H)). Liver Function Tests: Recent Labs  Lab 01/31/18 0922 02/02/18 1442  AST 24  --   ALT 11  --   ALKPHOS 105  --   BILITOT 1.0  --   PROT 8.1  --   ALBUMIN 3.0* 2.7*   Recent Labs  Lab 01/31/18 0922 02/02/18 1442  LIPASE 39 22   No results for input(s): AMMONIA in the last 168 hours. Coagulation Profile: No results for input(s): INR, PROTIME in the last 168 hours. Cardiac Enzymes: No results  for input(s): CKTOTAL, CKMB, CKMBINDEX, TROPONINI in the last 168 hours. BNP (last 3 results) No results for input(s): PROBNP in the last 8760 hours. HbA1C: No results for input(s): HGBA1C in the last 72 hours. CBG: Recent Labs  Lab 02/03/18 1648 02/03/18 2152 02/04/18 0747 02/04/18 0910 02/04/18 1129  GLUCAP 59* 70 63* 73 89   Lipid Profile: No results for input(s): CHOL, HDL, LDLCALC, TRIG, CHOLHDL, LDLDIRECT in the last 72 hours. Thyroid Function Tests: No results for input(s): TSH, T4TOTAL, FREET4, T3FREE, THYROIDAB in the last 72 hours. Anemia Panel: No results for input(s): VITAMINB12, FOLATE, FERRITIN, TIBC, IRON, RETICCTPCT in the last 72 hours. Urine analysis:    Component Value Date/Time   COLORURINE YELLOW 10/18/2013 0419   APPEARANCEUR CLEAR 10/18/2013 0419   LABSPEC 1.008 10/18/2013 0419   PHURINE 8.5 (H) 10/18/2013 0419   GLUCOSEU 100 (A) 10/18/2013 0419   HGBUR TRACE (A) 10/18/2013 0419   BILIRUBINUR NEGATIVE 10/18/2013 0419   KETONESUR NEGATIVE 10/18/2013 0419   PROTEINUR 100 (A) 10/18/2013 0419   UROBILINOGEN 0.2 10/18/2013 0419   NITRITE NEGATIVE 10/18/2013 0419   LEUKOCYTESUR NEGATIVE 10/18/2013 0419   Sepsis  Labs: _0 (procalcitonin:4,lacticacidven:4)  ) Recent Results (from the past 240 hour(s))  Blood culture (routine x 2)     Status: None (Preliminary result)   Collection Time: 01/31/18  9:25 AM  Result Value Ref Range Status   Specimen Description BLOOD RIGHT FOREARM  Final   Special Requests   Final    BOTTLES DRAWN AEROBIC AND ANAEROBIC Blood Culture results may not be optimal due to an inadequate volume of blood received in culture bottles   Culture   Final    NO GROWTH 4 DAYS Performed at [facility], [place] [address], [place], [place] [address]    Report Status PENDING  Incomplete  Blood culture (routine x 2)     Status: None (Preliminary result)   Collection Time: 01/31/18 12:50 PM  Result Value Ref Range Status   Specimen Description BLOOD RIGHT WRIST  Final   Special Requests   Final    BOTTLES DRAWN AEROBIC AND ANAEROBIC Blood Culture results may not be optimal due to an inadequate volume of blood received in culture bottles   Culture   Final    NO GROWTH 4 DAYS Performed at [facility], [place] [address], [place], [place] [address]    Report Status PENDING  Incomplete  Culture, body fluid-bottle     Status: None (Preliminary result)   Collection Time: 02/01/18  9:22 AM  Result Value Ref Range Status   Specimen Description PLEURAL RIGHT  Final   Special Requests NONE  Final   Culture   Final    NO GROWTH 3 DAYS Performed at [place] [address], [place], [place] [address]    Report Status PENDING  Incomplete  Gram stain     Status: None   Collection Time: 02/01/18  9:22 AM  Result Value Ref Range Status   Specimen Description PLEURAL RIGHT  Final   Special Requests NONE  Final   Gram Stain   Final    FEW WBC PRESENT, PREDOMINANTLY PMN NO ORGANISMS SEEN Performed at [facility], [place] [address] [place], [place] [address]    Report Status 02/02/2018 FINAL  Final         Radiology Studies: No results  found.      Scheduled Meds: . iopamidol      . Chlorhexidine  Gluconate Cloth  6 each Topical [id number]  . cinacalcet  90 mg Oral Q supper  . cyclobenzaprine  5 mg Oral TID  . diclofenac sodium  4 g Topical QID  . docusate sodium  100 mg Oral BID  . doxercalciferol  7 mcg Intravenous Q M,W,F-HD  . enoxaparin (LOVENOX) injection  30 mg Subcutaneous Q24H  . hydrocerin   Topical BID  . insulin aspart  0-9 Units Subcutaneous TID WC  . lisinopril  5 mg Oral Daily  . ondansetron (ZOFRAN) IV  4 mg Intravenous Q4H  . pantoprazole  40 mg Oral Daily  . sevelamer carbonate  1,600 mg Oral TID WC   Continuous Infusions:   LOS: 2 days    Time spent: 35 minutes    [name], MD [facility] 02/04/2018, 3:10 PM     Pager [phone] --- please page though AMION:  [email or web address] Password [email or web address] If 7PM-7AM, please contact night-coverage

## 2018-02-04 NOTE — Progress Notes (Signed)
Pt's CBG this AM was 63. RN initiated hypoglycemia protocol by administering 1/2 cup of milk to pt. At the time, pt was also starting his breakfast. RN re-assessed pt's CBG once pt done w/ meal CBG was 73. RN encouraged pt to continue eating his breakfast and finishing the juice that came with it. He verbalized understanding. RN will continue to monitor.

## 2018-02-04 NOTE — Progress Notes (Signed)
Admit: 01/31/2018 LOS: 2  48M ESRD admit with N/V/Abd pain, SOB and R pleural effusion  Subjective:  . No c/o . No CT A/P yet  07/16 0701 - 07/17 0700 In: 420 [P.O.:420] Out: 0   Filed Weights   02/02/18 1831 02/03/18 0253 02/04/18 0342  Weight: 72.5 kg (159 lb 13.3 oz) 73 kg (160 lb 15 oz) 77.8 kg (171 lb 8.3 oz)    Scheduled Meds: . Chlorhexidine Gluconate Cloth  6 each Topical Q0600  . cinacalcet  90 mg Oral Q supper  . cyclobenzaprine  5 mg Oral TID  . diclofenac sodium  4 g Topical QID  . docusate sodium  100 mg Oral BID  . doxercalciferol  7 mcg Intravenous Q M,W,F-HD  . enoxaparin (LOVENOX) injection  30 mg Subcutaneous Q24H  . hydrocerin   Topical BID  . insulin aspart  0-9 Units Subcutaneous TID WC  . lisinopril  5 mg Oral Daily  . ondansetron (ZOFRAN) IV  4 mg Intravenous Q4H  . pantoprazole  40 mg Oral Daily  . sevelamer carbonate  1,600 mg Oral TID WC   Continuous Infusions: PRN Meds:.camphor-menthol, diphenhydrAMINE, gi cocktail, HYDROmorphone (DILAUDID) injection, polyethylene glycol, traMADol  Current Labs: reviewed    Physical Exam:  Blood pressure (!) 163/118, pulse 94, temperature (!) 97.4 F (36.3 C), temperature source Oral, resp. rate 16, height _0  (1.88 m), weight 77.8 kg (171 lb 8.3 oz), SpO2 92 %. General: NAD,  Walking aroudn room Head: NCAT, EOMI Lungs: nl wob, scattered rhonci in bases, symmetricl, clear in upper fields Heart: RRR. No murmur, rubs or gallops.  Abdomen: s/nt/nd Lower extremities:trace edema b/l, +dry skin,  Neuro: AAOx3. Dialysis Access: LU AVF  Dialysis Orders:  MWF - [facility]  4hrs, BFR 400, DFR 800,  EDW 73kg, 2K/ 2.25 Ca  Access: LU AVF  Heparin None Mircera 225 mcg q2wks - last 7/3 Hectorol 77mg IV qHD   venofer 106mqHD - completed 4 of 10 venofer 5029mwk   A 1. R pleural effusion s/p 1.4L thoracentesis 7/14, per primary 2. ESRD MWF [facility] 3. HTN/ Vol: likely hypervolemia  contributing 4. Anemia at goal 5. 2HPTH 6. N/V/Abd pain; chronic pancreatitis, GI following for CT A/P  P . HD today on schedule, probe post weights down past EDW as able, try for 71.5kg post.  . Medication Issues; o Preferred narcotic agents for pain control are hydromorphone, fentanyl, and methadone. Morphine should not be used.  o Baclofen should be avoided o Avoid oral sodium phosphate and magnesium citrate based laxatives / bowel preps    RyaPearson Grippe 02/04/2018, 10:10 AM  Recent Labs  Lab 01/31/18 0922 01/31/18 1742 02/01/18 0245 02/02/18 1442  NA 139  --  142 136  K 4.0  --  5.4* 5.0  CL 98  --  105 99  CO2 27  --  22 23  GLUCOSE 78  --  58* 110*  BUN 29*  --  38* 46*  CREATININE 7.89* 8.36* 9.25* 11.54*  CALCIUM 9.6  --  9.6 8.7*  PHOS  --   --   --  8.6*   Recent Labs  Lab 01/31/18 1742 02/02/18 1442 02/04/18 0258  WBC 3.4* 3.2* 3.2*  HGB 11.1* 11.0* 11.1*  HCT 35.7* 34.9* 36.0*  MCV 85.4 85.1 85.9  PLT 131* 123* 137*

## 2018-02-04 NOTE — Progress Notes (Addendum)
[facility] PROGRESS NOTE Subjective Patient has still not as of yet had his CT scan.  He is supposed to get that today.  He states that he is still having pain in his epigastrium going to the back.  We went over his history again and he has been to [facility], [facility], and [facility] all 4 issues with his pancreas.  He apparently has had EUS of the pancreas with drainage of pseudocyst and pancreatic duct dilation with pancreatic stent placed that [facility] that was subsequently removed at [facility] for unclear reasons.  We do not have complete records for any of this.  The patient apparently did not like his care at [facility].  No acute changes  Objective: Vital signs in last 24 hours: Temp:  [97.4 F (36.3 C)-97.5 F (36.4 C)] 97.4 F (36.3 C) (07/16 2124) Pulse Rate:  [86-94] 94 (07/17 0752) Resp:  [14-17] 16 (07/17 0752) BP: (131-163)/(97-118) 163/118 (07/17 0752) SpO2:  [91 %-100 %] 92 % (07/17 0752) Weight:  [77.8 kg (171 lb 8.3 oz)] 77.8 kg (171 lb 8.3 oz) (07/17 0342) Last BM Date: 02/02/18  Intake/Output from previous day: 07/16 0701 - 07/17 0700 In: 420 [P.O.:420] Out: 0  Intake/Output this shift: No intake/output data recorded.  PE: General--patient in no distress  Abdomen--nondistended with mild epigastric tenderness.  Lab Results: Recent Labs    02/02/18 1442 02/04/18 0258  WBC 3.2* 3.2*  HGB 11.0* 11.1*  HCT 34.9* 36.0*  PLT 123* 137*   BMET Recent Labs    02/02/18 1442  NA 136  K 5.0  CL 99  CO2 23  CREATININE 11.54*   LFT No results for input(s): PROT, AST, ALT, ALKPHOS, BILITOT, BILIDIR, IBILI in the last 72 hours. PT/INR No results for input(s): LABPROT, INR in the last 72 hours. PANCREAS Recent Labs    02/02/18 1442  LIPASE 22         Studies/Results: No results found.  Medications: I have reviewed the patient's current medications.  Assessment:   1.  Chronic pancreatitis with prior pseudocyst drainage pancreatic stents  etc. 2.  Acute epigastric pain.  Lipase was elevated but recently has been normal.   Plan: This patient has had an extensive problem with chronic pancreatitis and has been seen at 2 different medical centers that have capabilities for advanced procedures on the pancreas.  These procedures are not available here.  I think we need to go ahead and get the CT scan of the pancreas with IV contrast.  After this is been obtained, I think he needs to follow-up either at [facility] or at [facility] both of which have seen him before and have more capabilities for advanced procedures on the pancreas if needed.   Nancy Fetter 02/04/2018, 10:19 AM  This note was created using voice recognition software. Minor errors may Have occurred unintentionally.  Pager: [phone] If no answer or after hours call [phone]

## 2018-02-05 LAB — CULTURE, BLOOD (ROUTINE X 2)
CULTURE: NO GROWTH
Culture: NO GROWTH

## 2018-02-05 LAB — COMPREHENSIVE METABOLIC PANEL
ALBUMIN: 2.8 g/dL — AB (ref 3.5–5.0)
ALK PHOS: 99 U/L (ref 38–126)
ALT: 9 U/L (ref 0–44)
ANION GAP: 12 (ref 5–15)
AST: 25 U/L (ref 15–41)
BILIRUBIN TOTAL: 1.1 mg/dL (ref 0.3–1.2)
BUN: 30 mg/dL — ABNORMAL HIGH (ref 6–20)
CO2: 24 mmol/L (ref 22–32)
CREATININE: 11.03 mg/dL — AB (ref 0.61–1.24)
Calcium: 8 mg/dL — ABNORMAL LOW (ref 8.9–10.3)
Chloride: 96 mmol/L — ABNORMAL LOW (ref 98–111)
GFR calc Af Amer: 5 mL/min — ABNORMAL LOW (ref >60)
GFR calc non Af Amer: 5 mL/min — ABNORMAL LOW (ref >60)
GLUCOSE: 97 mg/dL (ref 70–99)
Potassium: 4.6 mmol/L (ref 3.5–5.1)
Sodium: 132 mmol/L — ABNORMAL LOW (ref 135–145)
TOTAL PROTEIN: 7.3 g/dL (ref 6.5–8.1)

## 2018-02-05 LAB — CBC
HEMATOCRIT: 35.1 % — AB (ref 39.0–52.0)
HEMOGLOBIN: 11.1 g/dL — AB (ref 13.0–17.0)
MCH: 26.9 pg (ref 26.0–34.0)
MCHC: 31.6 g/dL (ref 30.0–36.0)
MCV: 85 fL (ref 78.0–100.0)
Platelets: 158 10*3/uL (ref 150–400)
RBC: 4.13 MIL/uL — AB (ref 4.22–5.81)
RDW: 17.6 % — ABNORMAL HIGH (ref 11.5–15.5)
WBC: 3.3 10*3/uL — ABNORMAL LOW (ref 4.0–10.5)

## 2018-02-05 LAB — GLUCOSE, CAPILLARY
GLUCOSE-CAPILLARY: 44 mg/dL — AB (ref 70–99)
GLUCOSE-CAPILLARY: 80 mg/dL (ref 70–99)
Glucose-Capillary: 131 mg/dL — ABNORMAL HIGH (ref 70–99)
Glucose-Capillary: 160 mg/dL — ABNORMAL HIGH (ref 70–99)
Glucose-Capillary: 45 mg/dL — ABNORMAL LOW (ref 70–99)

## 2018-02-05 LAB — MAGNESIUM: Magnesium: 2.3 mg/dL (ref 1.7–2.4)

## 2018-02-05 LAB — PHOSPHORUS: Phosphorus: 7.1 mg/dL — ABNORMAL HIGH (ref 2.5–4.6)

## 2018-02-05 MED ORDER — OXYCODONE HCL 5 MG PO TABS: 5.0000 mg | ORAL_TABLET | Freq: Three times a day (TID) | ORAL | 0 refills | Status: AC | PRN

## 2018-02-05 MED ORDER — ONDANSETRON HCL 4 MG PO TABS: 4.0000 mg | ORAL_TABLET | Freq: Every day | ORAL | 1 refills | Status: DC | PRN

## 2018-02-05 MED ORDER — DEXTROSE 50 % IV SOLN
1.0000 | INTRAVENOUS | Status: DC | PRN
  Administered 2018-02-05: 50 mL via INTRAVENOUS

## 2018-02-05 MED ORDER — DEXTROSE 50 % IV SOLN
INTRAVENOUS | Status: AC
  Administered 2018-02-05: 50 mL via INTRAVENOUS
  Filled 2018-02-05: qty 50

## 2018-02-05 MED ORDER — HYDROMORPHONE HCL 1 MG/ML IJ SOLN
INTRAMUSCULAR | Status: AC
  Filled 2018-02-05: qty 1

## 2018-02-05 MED ORDER — ONDANSETRON HCL 4 MG/2ML IJ SOLN
4.0000 mg | Freq: Four times a day (QID) | INTRAMUSCULAR | Status: DC | PRN
  Administered 2018-02-05 (×2): 4 mg via INTRAVENOUS
  Filled 2018-02-05 (×2): qty 2

## 2018-02-05 NOTE — Care Management (Addendum)
ED CM contacted 2W RN concerning update on patient discharge. Patient continues to complain about not feeling well after HD. CM explained that he has been discharged by MD, and he does not meet any criteria to remain a patient.  CM discussed with RN and covering MD who agrees. CM discussed this with patient and will assist patient with transportation home nursing staff updated. Taxi voucher tubed to unit for assistance with discharge.

## 2018-02-05 NOTE — Progress Notes (Signed)
[facility] PROGRESS NOTE Subjective Patient continues to have pain.  He points to his left lower quadrant the main area of pain going to his back.  Points to his left flank.  He had a CT scan with contrast yesterday.The pancreas was completely normal without any gross signs of chronic pancreatitis or acute pancreatitis and no signs of pseudocyst.  Other significant findings were renal transplant in the left lower quadrant it was not noted to have any particular issues and a 3 cm mass on the left upper kidney.  The patient has chronically been on PPI therapy for some time.  He denies much pain in the epigastrium or right upper quadrant.  Objective: Vital signs in last 24 hours: Temp:  [97.7 F (36.5 C)] 97.7 F (36.5 C) (07/17 2121) Pulse Rate:  [92-99] 99 (07/17 2121) Resp:  [12-16] 16 (07/17 2121) BP: (166-169)/(105-119) 166/105 (07/17 2121) SpO2:  [95 %-100 %] 95 % (07/17 2121) Weight:  [77.3 kg (170 lb 6.7 oz)] 77.3 kg (170 lb 6.7 oz) (07/18 0208) Last BM Date: 02/04/18  Intake/Output from previous day: 07/17 0701 - 07/18 0700 In: 357 [P.O.:357] Out: -  Intake/Output this shift: No intake/output data recorded.  PE: General--complaining of pain but in no distress  Abdomen--nondistended, good bowel sounds, relatively nontender in the right upper quadrant and epigastrium.  Tender in the left lower quadrant.  The left flank left paraspinous area is where he is most tender.  Not particularly tender over the spine  Lab Results: Recent Labs    02/02/18 1442 02/04/18 0258  WBC 3.2* 3.2*  HGB 11.0* 11.1*  HCT 34.9* 36.0*  PLT 123* 137*   BMET Recent Labs    02/02/18 1442 02/04/18 1721  NA 136 138  K 5.0 5.2*  CL 99 96*  CO2 23 25  CREATININE 11.54* 9.31*   LFT Recent Labs    02/04/18 1721  PROT 8.3*  AST 30  ALT 11  ALKPHOS 77  BILITOT 1.4*   PT/INR No results for input(s): LABPROT, INR in the last 72 hours. PANCREAS Recent Labs    02/02/18 1442   LIPASE 22         Studies/Results: Dg Chest 2 View  Result Date: 02/04/2018 CLINICAL DATA:  52 year old male with a history of pleural effusion EXAM: CHEST - 2 VIEW COMPARISON:  02/01/2018 FINDINGS: Cardiomediastinal silhouette likely unchanged, partially obscured by overlying lung and pleural disease. Persisting cardiomegaly. Interval enlargement of right-sided pleural fluid with obscuration the right hemidiaphragm and the right heart border. No pneumothorax. No significant interlobular septal thickening. IMPRESSION: Interval enlargement of right-sided pleural effusion with associated atelectasis/consolidation. Cardiomegaly Electronically Signed   By: Corrie Mckusick D.O.   On: 02/04/2018 20:32   Ct Abdomen Pelvis W Contrast  Result Date: 02/04/2018 CLINICAL DATA:  High-grade bowel obstruction EXAM: CT ABDOMEN AND PELVIS WITH CONTRAST TECHNIQUE: Multidetector CT imaging of the abdomen and pelvis was performed using the standard protocol following bolus administration of intravenous contrast. CONTRAST:  118m OMNIPAQUE IOHEXOL 300 MG/ML  SOLN COMPARISON:  01/31/2018 FINDINGS: Lower chest: Moderate right pleural effusion. Right lower lobe opacity, likely compressive atelectasis. Cardiomegaly. Moderate pericardial effusion. Hepatobiliary: Macronodular hepatic contour with suspected hepatic congestion. Gallbladder is unremarkable. No intrahepatic or extrahepatic ductal dilatation. Pancreas: Within normal limits. Spleen: Within normal limits. Adrenals/Urinary Tract: Adrenal glands are within normal limits. Bilateral native renal cortical scarring/atrophy. Left renal cysts, including a 2.1 cm left upper pole renal cyst with mild peripheral complexity, poorly evaluated. Additional 3.3 cm  lateral left upper pole cystic/solid mass, indeterminate. No hydronephrosis. Failed transplant kidney in the left lower quadrant (series 3/image 58). Bladder is underdistended but unremarkable. Stomach/Bowel: Stomach is  within normal limits. No evidence of bowel obstruction. No colonic wall thickening or inflammatory changes. Vascular/Lymphatic: No evidence of abdominal aortic aneurysm. Atherosclerotic calcifications of the abdominal aorta and branch vessels. No suspicious abdominopelvic lymphadenopathy. Reproductive: Prostate is unremarkable. Other: No abdominopelvic ascites. Musculoskeletal: Diffuse osseous sclerosis, compatible with renal osteodystrophy. IMPRESSION: No evidence of bowel obstruction. 3.3 cm left upper pole renal mass, indeterminate. Native renal atrophy. Failed left lower quadrant renal transplant kidney. Additional ancillary findings as above. Electronically Signed   By: Julian Hy M.D.   On: 02/04/2018 18:53    Medications: I have reviewed the patient's current medications.  Assessment:   1.  Abdominal pain.  The patient's tenderness appears to be more in the left lower quadrant radiating to the left flank.  He does have this history of pancreatitis of unclear etiology since he is not a drinker and had a prior pancreatic pseudocyst that was drained endoscopically with a stent.  This was placed at [facility] in [place] and for unclear reasons he went to a GI practice in [place] and the stent was removed since no pseudocyst was seen on evaluation there.  He apparently did have a pleural effusion in April while hospitalized in [place].  During these evaluations at [facility] and in [place] he had several EUS is and EGDs with stent removal with no ulcers being seen.  He has been on chronic PPI therapy. The CT scan here yesterday with IV contrast shows a normal pancreas with no chronic changes and no pseudocyst.  There is a mass in his left kidney and his pain seems to be emanating from his left flank and was located in the left lower quadrant.  I do not think the pancreas is the etiology of his symptoms and we need to try to decide what to do about his renal mass.   Plan: He is being  followed by nephrology and we will see if they feel we need to do anything about this renal mass.  At this point it does not appear that we need to evaluate his pancreas.  We will continue follow-up   Nancy Fetter 02/05/2018, 8:27 AM  This note was created using voice recognition software. Minor errors may Have occurred unintentionally.  Pager: [phone] If no answer or after hours call [phone]

## 2018-02-05 NOTE — Progress Notes (Signed)
RN received report from hemodialysis RN. 3L removed. Pt given dilaudid post procedure. And returning to floor.   Upon arrival to floor, RN went to assess pt. He was lethargic but orientedx4. RN informed pt it was time for his discharge per conversation pt, RN, and Danford MD had prior to hemodialysis. Pt stated he did not feel comfortable leaving right after hemodialysis. He reported pain to abdomen that radiated to his back. Pt stated that MD had spoken to him while he was at dialysis and discharge was to be postponed to tomorrow. RN verified that orders were indeed for discharge on 02/05/18 and no change in plans told to RN by MD. Pt requested to speak with MD.   RN paged MD to inform him that pt did not feel comfortable leaving right after dialysis. MD called RN back. Discussion had about if there were changes in pt's baseline that would cause postponment in pt's discharge. RN agreed that there were not. MD agreed to reassess pt himself and talk to him.   MD met with pt. Pt is aware that discharge is still to be done on 02/05/18 per MD's medical opinion.   RN reported conclusion to night shift RN Danae Chen). During change of shift report, pt asked to speak with social worker or case Freight forwarder. Danae Chen RN to follow up and aware of pt's plan of care.

## 2018-02-05 NOTE — Progress Notes (Signed)
Admit: 01/31/2018 LOS: 3  67M ESRD admit with N/V/Abd pain, SOB and R pleural effusion  Subjective:  . CT A/P w normal appearing pancreas, R pleural effusion, pericardial effusion, ? Hepatic congestion, normal spleen, 2.1cm L renal cyst with some complexity and 3.3 L cyst/solid mass . Pain in across L side of abdomen into L flank; not in inguinal region.  No palpable masses . Refused HD this AM  07/17 0701 - 07/18 0700 In: 357 [P.O.:357] Out: -   Filed Weights   02/03/18 0253 02/04/18 0342 02/05/18 7564  Weight: 73 kg (160 lb 15 oz) 77.8 kg (171 lb 8.3 oz) 77.3 kg (170 lb 6.7 oz)    Scheduled Meds: . apixaban  5 mg Oral BID  . Chlorhexidine Gluconate Cloth  6 each Topical Q0600  . cinacalcet  90 mg Oral Q supper  . cyclobenzaprine  5 mg Oral TID  . diclofenac sodium  4 g Topical QID  . docusate sodium  100 mg Oral BID  . doxercalciferol  7 mcg Intravenous Q M,W,F-HD  . hydrocerin   Topical BID  . insulin aspart  0-9 Units Subcutaneous TID WC  . lisinopril  5 mg Oral Daily  . ondansetron (ZOFRAN) IV  4 mg Intravenous Q4H  . pantoprazole  40 mg Oral Daily  . sevelamer carbonate  1,600 mg Oral TID WC   Continuous Infusions: PRN Meds:.camphor-menthol, diphenhydrAMINE, gi cocktail, HYDROmorphone (DILAUDID) injection, polyethylene glycol, traMADol  Current Labs: reviewed    Physical Exam:  Blood pressure (!) 161/120, pulse 90, temperature 97.6 F (36.4 C), temperature source Oral, resp. rate 19, height _0  (1.88 m), weight 77.3 kg (170 lb 6.7 oz), SpO2 96 %. General: NAD,  Walking aroudn room Head: NCAT, EOMI Lungs: nl wob, scattered rhonci in bases, symmetricl, clear in upper fields Heart: RRR. No murmur, rubs or gallops.  Abdomen: s/nt/nd Lower extremities:trace edema b/l, +dry skin,  Neuro: AAOx3. Dialysis Access: LU AVF  Dialysis Orders:  MWF - [facility]  4hrs, BFR 400, DFR 800,  EDW 73kg, 2K/ 2.25 Ca  Access: LU AVF  Heparin None Mircera 225 mcg q2wks - last  7/3 Hectorol 37mg IV qHD   venofer 107mqHD - completed 4 of 10 venofer 5066mwk   A 1. R pleural effusion s/p 1.4L thoracentesis 7/14, per primary; Pericardial effusion on CT 7/17 2. ESRD MWF [facility] AVF 3. HTN/ Vol: likely hypervolemia contributing 4. Anemia at goal 5. 2HPTH 6. N/V/Abd pain; chronic pancreatitis, GI following for CT A/P  P . Needs HD today but refused this AM, discussed; needs post weights probed down given presence of effusions.   . Needs urology eval for his renal masses, he states never seen urology before . Medication Issues; o Preferred narcotic agents for pain control are hydromorphone, fentanyl, and methadone. Morphine should not be used.  o Baclofen should be avoided o Avoid oral sodium phosphate and magnesium citrate based laxatives / bowel preps    RyaPearson Grippe 02/05/2018, 8:35 AM  Recent Labs  Lab 02/01/18 0245 02/02/18 1442 02/04/18 1721  NA 142 136 138  K 5.4* 5.0 5.2*  CL 105 99 96*  CO2 _1 GLUCOSE 58* 110* 65*  BUN 38* 46* 25*  CREATININE 9.25* 11.54* 9.31*  CALCIUM 9.6 8.7* 8.3*  PHOS  --  8.6*  --    Recent Labs  Lab 01/31/18 1742 02/02/18 1442 02/04/18 0258  WBC 3.4* 3.2* 3.2*  HGB 11.1* 11.0* 11.1*  HCT 35.7* 34.9* 36.0*  MCV 85.4 85.1 85.9  PLT 131* 123* 137*

## 2018-02-05 NOTE — Progress Notes (Signed)
Hypoglycemic Event  CBG: 69  Treatment: Juice  Symptoms: None  Follow-up CBG: Time:2341 CBG Result: 78  Possible Reasons for Event: Inadequate meal intake      Frank [name]

## 2018-02-05 NOTE — Care Management (Addendum)
ED CM received call from [facility] reported that wants to appeal his discharge. CM contacted patient to explain that he has to call to file the Appeal of Discharge with [employer] as stated on his IM letter delivered to him today. Patient stated that he is not feeling well after returning from HD. CM spoke with Hoyle Sauer RN who sates that MD ordered Zofran for patient. CM suggested RN call MD to inform of patient complaint.

## 2018-02-05 NOTE — Procedures (Signed)
Patient was seen on dialysis and the procedure was supervised.  BFR 400  Via AVF BP is  137/88.   Patient appears to be tolerating treatment well  Aras Albarran A 02/05/2018

## 2018-02-05 NOTE — Progress Notes (Signed)
Currently in the process of discharging the patient. Patient has vomited after receiving Zofran. Pt stated that the Zofran didn't relieve the nausea/vomiting and he still not feeling well. On call MD was informed via text page. No new orders received at this time. Pt stated that he will leave once he is feeling better. Charge nurse explained to patient that he is discharged and must leave soon.

## 2018-02-05 NOTE — Progress Notes (Signed)
Called back to bedside at 6:20P for change in patient status.  Patient reports to me that he started to feel tired and pain in left upper quadrant during dialysis.  He felt they were "taking too much fluid off", and they continued despite his protest.  His pain is constant, LUQ, moderate to severe, and radiating to the back.  It is associated with nausea.  He also feels that his left lower ribs are more prominent, where the pain is.   BP (!) 160/100 (BP Location: Right Wrist)   Pulse 78   Temp 98 F (36.7 C) (Oral)   Resp 18   Ht _0  (1.88 m)   Wt 73 kg (160 lb 15 oz)   SpO2 96%   BMI 20.66 kg/m   Appears tired, but is oriented and interactive.  Speech fluent, moves all limbs symmetrically.  CV regular, no murmurs, no LE edema.  Resp easy and unlabored, no rales or wheezes.  MSK shows no mass on the left ribs.  He indicates his left lower rib cage, which is mildly tender to palpation.  He also has lower paraspinal tenderness, lower than the kidney.  Abdomen is nonfocally tender to palpation and he guards voluntarily, but is no change from previous exams, no HSM, no rebound or rigidity.      A&P: He has recurrence of abdominal pain, probably persistence of his chief complaint, compounded by cramping from dialysis.  Reassurance given.  He has undergone 2 CT scans of the abdomen and pelvis this week, been evaluated by Nephrology and GI, had CMP and CBC as recently as today, unremarkable.  As outlined in my note, I suspect he may have some gastroparesis or a functional abdominal syndrome, or may be malingering  I recommend discharge and follow up with dialysis per his regular schedule.

## 2018-02-05 NOTE — Progress Notes (Signed)
Hypoglycemic Event  CBG: 44  Treatment: 50g of dextrose   Symptoms: None  Follow-up CBG: Time:11:55 CBG Result:160  Possible Reasons for Event: Inadequate meal intake   Comments/MD notified:Danford MD - informed RN to follow protocol     Renella Cunas RN

## 2018-02-05 NOTE — Progress Notes (Signed)
CSW tubed taxi voucher to floor for when needed by pt.   Wendelyn Breslow, [facility]  [phone]

## 2018-02-05 NOTE — Discharge Summary (Signed)
Physician Discharge Summary  Frank [name]:[id number] DOB: 01-21-1966 DOA: 01/31/2018  PCP: Patient, [name]  Admit date: 01/31/2018 Discharge date: 02/05/2018  Admitted From: Home  Disposition:  Home   Recommendations for Outpatient Follow-up:  1. Follow up with new PCP in 2 weeks 2. Follow up with Dr. Diona Fanti in 4-6 weeks re: renal tumor 3. Follow up with Dr. Loney Loh office regarding IBS vs gastroparesis vs functional abdominal syndrome   Home Health: None  Equipment/Devices: None  Discharge Condition: Good  CODE STATUS: FULL Diet recommendation: Renal, diabetic  Brief/Interim Summary: Frank [name] is a 52 y.o. M with ESRD on HD MWF, HTN, DM, and VTE on Eliquis, CHF EF 35% and chronic pancreatitis with pseudocyst who presents with severe constant abdominal pain, N/V and found incidentally to have a pleural effusion again.       Discharge Diagnoses:   Recurrent right-sided pleural effusion Thoracentesis at [facility] in June was transudative.  1.4L taken off this hospitalization, no chemistries sent.  No dyspnea, pain is on opposite side.  I discussed with Dr. Joelyn Oms, agree this is likely an effusion from under-dialysis.  He should have his dry weight pushed down.   -If patient able to be consistent with dialysis (he has been nonadherent to dialysis regularly), and able to reduce dry weight, but despite this, if the effusion still present, reasonable to obtain Pulmonary consult at that time.   Abdominal pain and vomiting, recurrent, episodic, food-associated I now suspect this is gastroparesis vs IBS vs a functional abdominal pain syndrome vs drug seeking behavior.  Pain and nausea have waxed and waned last two days, appears to be resolving.  He is able to eat without trouble.  The patient seems to volunteer different information to different providers, but this appears to have been an episode similar to previous episodes he has had at times over the last few years.  Some of  patient's previous notes suggest that he has "chronic abdominal pain" others do not.  Some suggest that he has "chronic pancreatitis", although I see no admissions for pancreatitis until the end of 2018.  This episode and others are loosely associated with vomiting and food provocation.  History of pseudocyst There was concern for a recurrent pseudocyst during this hospitalization, but this was ruled out. -In Jan 2019, imaging showed enlarging pancreatic pseudocyst.   -In Feb he was transferred to [facility] for EUS, where the wall of the cyst was too imature to drain. -In March he was readmitted to [facility], and this time underwent cystogastrostomy with Axios stent placement.  There was pneumoperitoneum that was treated conservatively (and resolved with serial imaging, no intervention).  He had poor drainage of the cyst, which persisted despite repositioning stent, and then ultimately he had a percutaneous drain placed.  This grew MRSA, and he was started on Vancomycin, which was intented to be continued for six weeks with HD (at the time of discharge Apr 7) Early June, patient was admitted at [place] [facility] for chest pain from fluid overload causing reaccumulation of his pleural effusion, and at that time it was determined that he had never had the stent removed from March (discharge summary at that time says it should have been removed in mid April).   Repeat CT imaging at that time showed no more fluid collection (collapsed pseudocyst), no active pancreatitis.  He had the [facility] stent removed. -CT imaging without contrast here was unremarkable -Repeat CT with contrast showed no SBO, no pancreatitis,  no pseudocyst, no splenomegaly or infarcts, no rib fractures, no masses, no bleeding -Recommend outpatient follow up with GI re: IBS, gastroparesis; discussed with Dr. Oletta Lamas; given his multiplicity of problems, positively identifying the cuase of his symptoms is unlikely  ESRD Generally  noncompliant.  Frequent admissions for fluid overload or hyperkalemia.  Anemia of chronic renal disease Stable relative to baseline.  Hypertension Poorly controlled.   Recurrent VTE Continued on apixaban after thoracentesis.  Diabetes Diet controlled.  Does not need insulin corrections in the hospital.  Chronic systolic CHF EF 03%.  Appears euvolemic other than pleural effusion.  HD today.  Left renal mass Follow up with Dr. Diona Fanti has been arranged.  This is possibly the casue of his flank pain.  He was prescribed with a short course of oxycodone, has follow up with PCP.          Discharge Instructions  Discharge Instructions    Discharge instructions   Complete by:  As directed    From Dr. Loleta Books: You were admitted for fluid around your lung and abdominal pain.  Your fluid around your lung is likely from not getting long enough dialysis.  Dr. Joelyn Oms recommends trying to reduce your "dry weight", to get more fluid off.   If you and the nephrologists are able to get as much fluid off as possible, that should resolve the lung effusion.   With regard to the abdominal pain: We are sure from your imaging that this isn't from the pancreas, that pancreatic cyst is gone, it is not from your spleen or ribs/bones, it is not from a bowel obstruction.  It is possibly from the cyst on your kidneys, but I am not sure. I suspect it is more likely from something like IBS or gastroparesis. For that reason, it is important that you follow up with Dr. Loney Loh office Please call his office for a follow up appointment: [phone]  ALSO, it is extremely important that you do follow up for your kidney cyst.   Call Dr. Alan Ripper office at [facility] Their number is:  [phone] Ask them for a follow up appointment.   Increase activity slowly   Complete by:  As directed      Allergies as of 02/05/2018      Reactions   Butalbital-apap-caffeine Shortness Of  Breath, Swelling, Other (See Comments)   Swelling in throat   Ferrlecit [na Ferric Gluc Cplx In Sucrose] Shortness Of Breath, Swelling, Other (See Comments)   Swelling in throat, tolerates Venofor   Minoxidil Shortness Of Breath   Tylenol [acetaminophen] Anaphylaxis, Swelling   Darvocet [propoxyphene N-acetaminophen] Hives      Medication List    STOP taking these medications   oxyCODONE-acetaminophen 5-325 MG tablet Commonly known as:  PERCOCET/ROXICET     TAKE these medications   ammonium lactate 12 % lotion Commonly known as:  LAC-HYDRIN Apply topically as needed for dry skin.   apixaban 5 MG Tabs tablet Commonly known as:  ELIQUIS Take 1 tablet (5 mg total) by mouth 2 (two) times daily.   Darbepoetin Alfa 60 MCG/0.3ML Sosy injection Commonly known as:  ARANESP Inject 0.3 mLs (60 mcg total) into the vein every Wednesday with hemodialysis.   diclofenac sodium 1 % Gel Commonly known as:  VOLTAREN Apply 2 g topically 4 (four) times daily as needed.   docusate sodium 100 MG capsule Commonly known as:  COLACE Take 1 capsule (100 mg total) by mouth 2 (two) times daily. What changed:  when to take this  reasons to take this   lisinopril 5 MG tablet Commonly known as:  PRINIVIL,ZESTRIL Take 1 tablet (5 mg total) by mouth daily.   ondansetron 8 MG disintegrating tablet Commonly known as:  ZOFRAN-ODT Take 1 tablet (8 mg total) by mouth every 8 (eight) hours as needed for nausea or vomiting.   oxyCODONE 5 MG immediate release tablet Commonly known as:  ROXICODONE Take 1 tablet (5 mg total) by mouth every 8 (eight) hours as needed for up to 5 days.   pantoprazole 40 MG tablet Commonly known as:  PROTONIX Take 40 mg by mouth daily.   polyethylene glycol packet Commonly known as:  MIRALAX / GLYCOLAX Take 17 g by mouth daily. What changed:    when to take this  reasons to take this   sevelamer carbonate 800 MG tablet Commonly known as:  RENVELA Take 3  tablets (2,400 mg total) by mouth 3 (three) times daily with meals. What changed:  how much to take      Follow-up Information    Koirala, Dibas, MD Follow up on 02/16/2018.   Specialty:  Family Medicine Why:  3:45 for new patient apt  Contact information: [address] [place] [address] [phone]          Allergies  Allergen Reactions  . Butalbital-Apap-Caffeine Shortness Of Breath, Swelling and Other (See Comments)    Swelling in throat  . Ferrlecit [Na Ferric Gluc Cplx In Sucrose] Shortness Of Breath, Swelling and Other (See Comments)    Swelling in throat, tolerates Venofor  . Minoxidil Shortness Of Breath  . Tylenol [Acetaminophen] Anaphylaxis and Swelling  . Darvocet [Propoxyphene N-Acetaminophen] Hives    Consultations:  GI  Nephrology   Procedures/Studies: Ct Abdomen Pelvis Wo Contrast  Result Date: 01/31/2018 CLINICAL DATA:  Nausea and vomiting. EXAM: CT CHEST, ABDOMEN AND PELVIS WITHOUT CONTRAST TECHNIQUE: Multidetector CT imaging of the chest, abdomen and pelvis was performed following the standard protocol without IV contrast. COMPARISON:  Chest and abdominal x-rays from same day. CT abdomen pelvis dated January 25, 2018. CT chest dated November 09, 2017. FINDINGS: CT CHEST FINDINGS Cardiovascular: Unchanged moderate cardiomegaly and small pericardial effusion. Normal caliber thoracic aorta. Coronary, aortic arch, and branch vessel atherosclerotic vascular disease. Unchanged mildly enlarged main pulmonary artery measuring up to 3.4 cm. Mediastinum/Nodes: Mildly enlarged mediastinal lymph nodes appears stable to slightly decreased in size. No axillary lymphadenopathy. The thyroid gland, trachea, and esophagus demonstrate no significant findings. Lungs/Pleura: Large right pleural effusion. Faint layering ground-glass density in both upper lobes likely reflects mild pulmonary edema. Increased atelectasis in the right upper, middle, and lower lobes.  Mild peribronchial thickening. No pneumothorax. No suspicious pulmonary nodule. Musculoskeletal: Diffuse sclerosis, consistent with renal osteodystrophy. No acute or significant osseous findings. Old fracture deformity of the right proximal clavicle. CT ABDOMEN PELVIS FINDINGS Hepatobiliary: No focal liver abnormality is seen. No gallstones, gallbladder wall thickening, or biliary dilatation. Pancreas: Unremarkable. No pancreatic ductal dilatation or surrounding inflammatory changes. Spleen: Normal in size without focal abnormality. Adrenals/Urinary Tract: The adrenal glands are unremarkable. Bilateral renal atrophy again noted. Unchanged 3.3 cm thick-walled complex lesion arising from the left kidney. Unchanged small bilateral simple cysts. No renal or ureteral calculi. No hydronephrosis. 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 The bladder is unremarkable for the degree of distention. Stomach/Bowel: Stomach is within normal limits. Appendix not definitively visualized, however there are no signs of inflammation at the base of the cecum.  No evidence of bowel wall thickening, distention, or inflammatory changes. Vascular/Lymphatic: Aortic atherosclerosis. No enlarged abdominal or pelvic lymph nodes. Reproductive: Prostate is unremarkable. Other: No free fluid or pneumoperitoneum. Musculoskeletal: Diffuse sclerosis. No acute or significant osseous findings. IMPRESSION: Chest: 1. Large right pleural effusion with compressive right lung atelectasis, greatest in the right lower lobe. 2. Unchanged moderate cardiomegaly and small pericardial effusion. Faint layering ground-glass density in both upper lobes likely reflects mild pulmonary edema. 3. Unchanged mildly enlarged main pulmonary artery, consistent with pulmonary arterial hypertension. 4. Stable to slight interval decrease in size of mildly enlarged mediastinal lymph nodes, favored reactive. 5.  Aortic atherosclerosis (ICD10-I70.0).  Abdomen and pelvis: 1.  No acute intra-abdominal process. 2. Unchanged 3.3 cm indeterminate left renal mass. Electronically Signed   By: Titus Dubin M.D.   On: 01/31/2018 13:59   Ct Abdomen Pelvis Wo Contrast  Result Date: 01/25/2018 CLINICAL DATA:  Abdominal pain and emesis. Recent small-bowel obstruction. Renal failure EXAM: CT ABDOMEN AND PELVIS WITHOUT CONTRAST TECHNIQUE: Multidetector CT imaging of the abdomen and pelvis was performed following the standard protocol without oral or IV contrast. COMPARISON:  Multiple prior CT examinations, most recent January 16, 2018 FINDINGS: Lower chest: There is a sizable pleural effusion on the right with consolidation in the right base posteriorly. Visualized left lung base shows mild atelectasis. There is cardiomegaly with foci of coronary artery calcification. There is a small pericardial effusion. Hepatobiliary: No focal liver lesions are evident on this noncontrast enhanced study. Gallbladder wall is not appreciably thickened. There is no biliary duct dilatation. Pancreas: No pancreatic mass or inflammatory focus is evident. Spleen: No splenic lesions are evident. Adrenals/Urinary Tract: Adrenals appear normal bilaterally. Native kidneys are atrophic bilaterally. There is a 2.1 x 2.1 cm cyst arising from the upper pole left kidney. There is a thick-walled complex mass arising from the lateral left kidney measuring 3.3 x 3.1 cm. There is no evident hydronephrosis on either side. In the left anterior pelvis, there is a stable heavily calcified ovoid mass consistent with failed transplant kidney. There is no appreciable recognizable renal tissue in this area. There is no evident renal or ureteral calculus on either side. Urinary bladder is midline with mild wall thickening. Stomach/Bowel: Most bowel loops are fluid filled. There is no frank bowel wall thickening or transition zone to suggest bowel obstruction. No free air or portal venous air is evident.  Vascular/Lymphatic: There is extensive aortoiliac atherosclerosis. There is atherosclerotic calcification throughout major mesenteric and pelvic arterial vessels. No mesenteric arterial obstruction is seen on this noncontrast enhanced study. There is no frank aneurysm. There is no appreciable adenopathy in the abdomen or pelvis. Reproductive: Prostate and seminal vesicles appear normal. There is extensive seminal vesicle calcification. There is calcification in the arteries of the penile and scrotal regions. Other: There is slight ascites in the abdomen and pelvis. Appendix region appears unremarkable. No abscess is evident in the abdomen or pelvis. Musculoskeletal: The bones show diffuse sclerosis consistent with renal osteodystrophy. No destructive lesions are evident. There are Schmorl's nodes at several levels in the lower thoracic and lumbar regions. No intramuscular lesions are evident. IMPRESSION: 1. Evidence of renal failure with atrophic native kidneys bilaterally. Failed transplant kidney with extensive calcification noted in the left anterior pelvis. 2. Indeterminate mass arising from lateral mid left kidney measuring 3.3 x 3.1 cm. A renal neoplasm in this area cannot be excluded. Note that patients with chronic renal failure have increased risk for renal cell carcinoma development. This  finding may warrant nonemergent abdominal MR for further assessment. 3. There is fluid throughout most of the bowel, likely due to a degree of ileus or enteritis. No frank bowel obstruction evident. 4. Sizable right pleural effusion with consolidation/atelectasis right base. 5. Extensive atherosclerotic calcification throughout the aorta as well as major pelvic and mesenteric arterial vessels. No frank aneurysm. There are multiple foci of coronary artery calcification. 6.  Cardiomegaly with small pericardial effusion. 7. Mild ascites. No abscess in the abdomen or pelvis. No periappendiceal region inflammation. 8.  Bones  show diffuse changes of renal osteodystrophy. Aortic Atherosclerosis (ICD10-I70.0). Electronically Signed   By: Lowella Grip III M.D.   On: 01/25/2018 13:04   Ct Abdomen Pelvis Wo Contrast  Result Date: 01/16/2018 CLINICAL DATA:  52 y/o M; right upper quadrant abdominal pain and vomiting. EXAM: CT ABDOMEN AND PELVIS WITHOUT CONTRAST TECHNIQUE: Multidetector CT imaging of the abdomen and pelvis was performed following the standard protocol without IV contrast. COMPARISON:  12/01/2017, 08/25/2017, 06/24/2017, 04/20/2017, 07/29/2016 CT abdomen and pelvis. FINDINGS: Lower chest: Large right pleural effusion. Stable pericardial effusion and cardiomegaly. Hepatobiliary: No focal liver abnormality is seen. No gallstones, gallbladder wall thickening, or biliary dilatation. Pancreas: Unremarkable. No pancreatic ductal dilatation or surrounding inflammatory changes. Spleen: Normal in size without focal abnormality. Adrenals/Urinary Tract: Normal adrenal glands. Atrophic kidneys. Left kidney indeterminate lesion measuring 3.3 cm, stable to decreased in size in comparison with prior abdomen and pelvis CTs. Left pelvic renal transplant with extensive vascular calcification, atrophy, and diffuse loss of normal architecture. Stomach/Bowel: No obstructive or inflammatory changes of the large bowel. Normal stomach. Multiple dilated loops of small bowel in the mid abdomen with wall thickening. No findings of perforation. Vascular/Lymphatic: Aortic atherosclerosis. No enlarged abdominal or pelvic lymph nodes. Reproductive: Prostate is unremarkable. Other: Small volume of ascites amongst small-bowel and mesenteric edema. Musculoskeletal: No fracture is seen. IMPRESSION: 1. Dilated loops of small bowel in the mid abdomen with wall thickening likely representing small bowel obstruction. No findings of perforation. 2. Small volume of ascites amongst small-bowel. Mild mesenteric edema. 3. Large right pleural effusion increased  from prior CT. Stable small pericardial effusion and cardiomegaly. 4. Left kidney indeterminate lesion stable to decreased in size in comparison with prior CTs of the abdomen and pelvis. 5. Aortic atherosclerosis. Electronically Signed   By: Kristine Garbe M.D.   On: 01/16/2018 04:43   Dg Chest 1 View  Result Date: 01/25/2018 CLINICAL DATA:  Shortness of breath.  Renal failure. EXAM: CHEST  1 VIEW COMPARISON:  January 19, 2018 and January 05, 2018 FINDINGS: There is a persistent right pleural effusion. There is patchy consolidation in the right base. There is fine reticular interstitial disease throughout the lungs bilaterally, stable. Heart is enlarged with pulmonary vascularity normal. No adenopathy. No evident bone lesions. IMPRESSION: Fairly small but stable pleural effusion on the right with consolidation in a portion of the right base. Underlying diffuse reticular interstitial disease. Stable cardiomegaly. No adenopathy evident. Electronically Signed   By: Lowella Grip III M.D.   On: 01/25/2018 12:13   Dg Chest 1 View  Result Date: 01/19/2018 CLINICAL DATA:  Status post right thoracentesis EXAM: CHEST  1 VIEW COMPARISON:  01/16/2018 FINDINGS: Previously seen right pleural effusion has reduced significantly following thoracentesis. No pneumothorax is noted. Cardiac shadow remains enlarged. No focal infiltrate is seen. Mild central vascular congestion is noted without edema. IMPRESSION: Status post right thoracentesis without pneumothorax. Electronically Signed   By: Inez Catalina M.D.   On:  01/19/2018 12:02   Dg Chest 2 View  Result Date: 02/04/2018 CLINICAL DATA:  52 year old male with a history of pleural effusion EXAM: CHEST - 2 VIEW COMPARISON:  02/01/2018 FINDINGS: Cardiomediastinal silhouette likely unchanged, partially obscured by overlying lung and pleural disease. Persisting cardiomegaly. Interval enlargement of right-sided pleural fluid with obscuration the right hemidiaphragm and the  right heart border. No pneumothorax. No significant interlobular septal thickening. IMPRESSION: Interval enlargement of right-sided pleural effusion with associated atelectasis/consolidation. Cardiomegaly Electronically Signed   By: Corrie Mckusick D.O.   On: 02/04/2018 20:32   X-ray Chest Pa And Lateral  Result Date: 02/01/2018 CLINICAL DATA:  52 year old male with recurrent right-sided pleural effusion now status post repeat right thoracentesis. EXAM: CHEST - 2 VIEW COMPARISON:  Most recent prior chest x-ray 01/31/2018 FINDINGS: No evidence of pneumothorax following right-sided thoracentesis. Significantly reduced right-sided pleural effusion. Minimal residual pleural fluid and atelectasis. Stable cardiomegaly. Atherosclerotic calcifications are again noted in the transverse aorta. Mild vascular congestion without overt edema. No acute osseous abnormality. IMPRESSION: Status post right-sided thoracentesis without evidence of pneumothorax. Electronically Signed   By: Jacqulynn Cadet M.D.   On: 02/01/2018 10:00   Ct Chest Wo Contrast  Result Date: 01/31/2018 CLINICAL DATA:  Nausea and vomiting. EXAM: CT CHEST, ABDOMEN AND PELVIS WITHOUT CONTRAST TECHNIQUE: Multidetector CT imaging of the chest, abdomen and pelvis was performed following the standard protocol without IV contrast. COMPARISON:  Chest and abdominal x-rays from same day. CT abdomen pelvis dated January 25, 2018. CT chest dated November 09, 2017. FINDINGS: CT CHEST FINDINGS Cardiovascular: Unchanged moderate cardiomegaly and small pericardial effusion. Normal caliber thoracic aorta. Coronary, aortic arch, and branch vessel atherosclerotic vascular disease. Unchanged mildly enlarged main pulmonary artery measuring up to 3.4 cm. Mediastinum/Nodes: Mildly enlarged mediastinal lymph nodes appears stable to slightly decreased in size. No axillary lymphadenopathy. The thyroid gland, trachea, and esophagus demonstrate no significant findings. Lungs/Pleura:  Large right pleural effusion. Faint layering ground-glass density in both upper lobes likely reflects mild pulmonary edema. Increased atelectasis in the right upper, middle, and lower lobes. Mild peribronchial thickening. No pneumothorax. No suspicious pulmonary nodule. Musculoskeletal: Diffuse sclerosis, consistent with renal osteodystrophy. No acute or significant osseous findings. Old fracture deformity of the right proximal clavicle. CT ABDOMEN PELVIS FINDINGS Hepatobiliary: No focal liver abnormality is seen. No gallstones, gallbladder wall thickening, or biliary dilatation. Pancreas: Unremarkable. No pancreatic ductal dilatation or surrounding inflammatory changes. Spleen: Normal in size without focal abnormality. Adrenals/Urinary Tract: The adrenal glands are unremarkable. Bilateral renal atrophy again noted. Unchanged 3.3 cm thick-walled complex lesion arising from the left kidney. Unchanged small bilateral simple cysts. No renal or ureteral calculi. No hydronephrosis. 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 The bladder is unremarkable for the degree of distention. Stomach/Bowel: Stomach is within normal limits. Appendix not definitively visualized, however there are no signs of inflammation at the base of the cecum. No evidence of bowel wall thickening, distention, or inflammatory changes. Vascular/Lymphatic: Aortic atherosclerosis. No enlarged abdominal or pelvic lymph nodes. Reproductive: Prostate is unremarkable. Other: No free fluid or pneumoperitoneum. Musculoskeletal: Diffuse sclerosis. No acute or significant osseous findings. IMPRESSION: Chest: 1. Large right pleural effusion with compressive right lung atelectasis, greatest in the right lower lobe. 2. Unchanged moderate cardiomegaly and small pericardial effusion. Faint layering ground-glass density in both upper lobes likely reflects mild pulmonary edema. 3. Unchanged mildly enlarged main pulmonary artery,  consistent with pulmonary arterial hypertension. 4. Stable to slight interval decrease in size of mildly enlarged mediastinal  lymph nodes, favored reactive. 5.  Aortic atherosclerosis (ICD10-I70.0). Abdomen and pelvis: 1.  No acute intra-abdominal process. 2. Unchanged 3.3 cm indeterminate left renal mass. Electronically Signed   By: Titus Dubin M.D.   On: 01/31/2018 13:59   Ct Abdomen Pelvis W Contrast  Result Date: 02/04/2018 CLINICAL DATA:  High-grade bowel obstruction EXAM: CT ABDOMEN AND PELVIS WITH CONTRAST TECHNIQUE: Multidetector CT imaging of the abdomen and pelvis was performed using the standard protocol following bolus administration of intravenous contrast. CONTRAST:  163m OMNIPAQUE IOHEXOL 300 MG/ML  SOLN COMPARISON:  01/31/2018 FINDINGS: Lower chest: Moderate right pleural effusion. Right lower lobe opacity, likely compressive atelectasis. Cardiomegaly. Moderate pericardial effusion. Hepatobiliary: Macronodular hepatic contour with suspected hepatic congestion. Gallbladder is unremarkable. No intrahepatic or extrahepatic ductal dilatation. Pancreas: Within normal limits. Spleen: Within normal limits. Adrenals/Urinary Tract: Adrenal glands are within normal limits. Bilateral native renal cortical scarring/atrophy. Left renal cysts, including a 2.1 cm left upper pole renal cyst with mild peripheral complexity, poorly evaluated. Additional 3.3 cm lateral left upper pole cystic/solid mass, indeterminate. No hydronephrosis. Failed transplant kidney in the left lower quadrant (series 3/image 58). Bladder is underdistended but unremarkable. Stomach/Bowel: Stomach is within normal limits. No evidence of bowel obstruction. No colonic wall thickening or inflammatory changes. Vascular/Lymphatic: No evidence of abdominal aortic aneurysm. Atherosclerotic calcifications of the abdominal aorta and branch vessels. No suspicious abdominopelvic lymphadenopathy. Reproductive: Prostate is unremarkable. Other: No  abdominopelvic ascites. Musculoskeletal: Diffuse osseous sclerosis, compatible with renal osteodystrophy. IMPRESSION: No evidence of bowel obstruction. 3.3 cm left upper pole renal mass, indeterminate. Native renal atrophy. Failed left lower quadrant renal transplant kidney. Additional ancillary findings as above. Electronically Signed   By: SJulian HyM.D.   On: 02/04/2018 18:53   Dg Abdomen Acute W/chest  Result Date: 01/31/2018 CLINICAL DATA:  Vomiting. EXAM: DG ABDOMEN ACUTE W/ 1V CHEST COMPARISON:  01/28/2018 FINDINGS: Chest x-ray shows some increase in moderate right pleural effusion with associated atelectasis and consolidation of the right lower lung. Component of pneumonia cannot be excluded. Mild interstitial edema appears similar to the prior study. Abdominal films show no bowel obstruction or ileus. No free air identified. No abnormal calcifications. Bony structures are unremarkable. IMPRESSION: Increase in volume of moderate right pleural effusion with associated atelectasis and consolidation of the right lower lung. Underlying pneumonia cannot be excluded. Mild pulmonary interstitial edema appears similar to the prior study and is likely chronic. Electronically Signed   By: GAletta EdouardM.D.   On: 01/31/2018 10:58   Dg Abd Acute W/chest  Result Date: 01/28/2018 CLINICAL DATA:  Abdominal pain, onset yesterday. Nausea and vomiting. EXAM: DG ABDOMEN ACUTE W/ 1V CHEST COMPARISON:  Multiple prior exams. Most recent chest radiograph and abdominal CT 01/25/2018 FINDINGS: Moderate right pleural effusion with adjacent atelectasis. Unchanged cardiomegaly. There are aortic atherosclerotic calcifications. No pulmonary edema. No bowel dilatation to suggest obstruction. Generalized paucity of bowel gas in the abdomen and pelvis. Left lower quadrant calcifications correspond failed renal transplant. Additionally there are vascular calcifications. Unchanged osseous structures. IMPRESSION: 1. No bowel  obstruction or free air. 2. Moderate right pleural effusion with adjacent atelectasis, unchanged from recent exams. Electronically Signed   By: MJeb LeveringM.D.   On: 01/28/2018 04:41   Dg Abdomen Acute W/chest  Result Date: 01/16/2018 CLINICAL DATA:  Left upper quadrant abdominal pain. Patient went to dialysis and pain started shortly thereafter. EXAM: DG ABDOMEN ACUTE W/ 1V CHEST COMPARISON:  CXR 01/05/2018 FINDINGS: AP view of the chest demonstrates cardiomegaly with  loculated moderate to large right pleural effusion along the periphery of the right hemithorax. Supine and upright views of the abdomen demonstrate mild gaseous distention of small bowel with air-fluid levels on the upright suspicious for early or partial SBO versus ileus. No free air. No radiopaque calculi nor acute osseous abnormality. IMPRESSION: 1. Cardiomegaly with moderate to large right loculated pleural effusion. 2. Abnormal bowel gas pattern with mild to moderately distended small bowel with air-fluid levels suspicious for early or partial SBO versus small bowel ileus. Electronically Signed   By: Ashley Royalty M.D.   On: 01/16/2018 03:42   Dg Abd Portable 1v-small Bowel Obstruction Protocol-initial, 8 Hr Delay  Result Date: 01/17/2018 CLINICAL DATA:  Small-bowel obstruction, 8 hour delay EXAM: PORTABLE ABDOMEN - 1 VIEW COMPARISON:  CT 01/16/2018 and abdomen radiographs 01/16/2018. FINDINGS: Oral contrast is seen within nonobstructed, nondistended large bowel to the level of the rectum. No significant small bowel dilatation is seen. No free air. Loculated right effusion. IMPRESSION: Oral contrast is seen within the colon without obstruction. Partially included loculated right effusion. Electronically Signed   By: Ashley Royalty M.D.   On: 01/17/2018 01:44   Ir Thoracentesis Asp Pleural Space W/img Guide  Result Date: 01/19/2018 INDICATION: Patient with history of shortness of breath and recurrent right-sided pleural effusion.  Request is made for therapeutic right thoracentesis. EXAM: ULTRASOUND GUIDED THERAPEUTIC RIGHT THORACENTESIS MEDICATIONS: 10 mL of 2% lidocaine COMPLICATIONS: None immediate. PROCEDURE: An ultrasound guided thoracentesis was thoroughly discussed with the patient and questions answered. The benefits, risks, alternatives and complications were also discussed. The patient understands and wishes to proceed with the procedure. Written consent was obtained. Ultrasound was performed to localize and mark an adequate pocket of fluid in the right chest. The area was then prepped and draped in the normal sterile fashion. 2% Lidocaine was used for local anesthesia. Under ultrasound guidance a 6 Fr Safe-T-Centesis catheter was introduced. Thoracentesis was performed. The catheter was removed and a dressing applied. FINDINGS: A total of approximately 1.5 L of clear gold fluid was removed. IMPRESSION: Successful ultrasound guided right thoracentesis yielding 1.5 L of pleural fluid. Read by: Earley Abide, PA-C Electronically Signed   By: Sandi Mariscal M.D.   On: 01/19/2018 12:24   US Thoracentesis Asp Pleural Space W/img Guide  Result Date: 02/01/2018 INDICATION: End-stage renal disease on hemodialysis. Shortness of breath. Right-sided pleural effusion. Request for diagnostic and therapeutic thoracentesis. EXAM: ULTRASOUND GUIDED RIGHT THORACENTESIS MEDICATIONS: None. COMPLICATIONS: None immediate. PROCEDURE: An ultrasound guided thoracentesis was thoroughly discussed with the patient and questions answered. The benefits, risks, alternatives and complications were also discussed. The patient understands and wishes to proceed with the procedure. Written consent was obtained. Ultrasound was performed to localize and mark an adequate pocket of fluid in the right chest. The area was then prepped and draped in the normal sterile fashion. 1% Lidocaine was used for local anesthesia. Under ultrasound guidance a 6 Fr Safe-T-Centesis  catheter was introduced. Thoracentesis was performed. The catheter was removed and a dressing applied. FINDINGS: A total of approximately 1.4 L of clear yellow fluid was removed. Samples were sent to the laboratory as requested by the clinical team. IMPRESSION: Successful ultrasound guided right thoracentesis yielding 1.4 L of pleural fluid. Read by: Ascencion Dike PA-C Electronically Signed   By: Jacqulynn Cadet M.D.   On: 02/01/2018 10:33       Subjective: Pain improved, still present.  Nausea resolved.  Constipated.  No fever, chills, dyspnea.  Discharge Exam: Vitals:  02/05/18 1400 02/05/18 1430  BP: 137/88 (!) 140/99  Pulse: 80 86  Resp: 18 18  Temp:    SpO2:     Vitals:   02/05/18 1325 02/05/18 1335 02/05/18 1400 02/05/18 1430  BP: (!) 155/96 (!) 155/90 137/88 (!) 140/99  Pulse: 80 80 80 86  Resp: _0 Temp:  98 F (36.7 C)    TempSrc:  Oral    SpO2:  96%    Weight:  76 kg (167 lb 8.8 oz)    Height:        General: Pt is alert, awake, not in acute distress, sittin gon edge of bed, seems to have pain with movement Cardiovascular: RRR, S1/S2 +, no rubs, no gallops, no LE edema Respiratory: CTA bilaterally, no wheezing, no rhonchi Abdominal: Soft, diffusely gaudring, no rebound, nothing focal, ND  Extremities: no edema, no cyanosis    The results of significant diagnostics from this hospitalization (including imaging, microbiology, ancillary and laboratory) are listed below for reference.     Microbiology: Recent Results (from the past 240 hour(s))  Blood culture (routine x 2)     Status: None (Preliminary result)   Collection Time: 01/31/18  9:25 AM  Result Value Ref Range Status   Specimen Description BLOOD RIGHT FOREARM  Final   Special Requests   Final    BOTTLES DRAWN AEROBIC AND ANAEROBIC Blood Culture results may not be optimal due to an inadequate volume of blood received in culture bottles   Culture   Final    NO GROWTH 4 DAYS Performed at  [facility], [place] [address] [place], [name] [address]    Report Status PENDING  Incomplete  Blood culture (routine x 2)     Status: None (Preliminary result)   Collection Time: 01/31/18 12:50 PM  Result Value Ref Range Status   Specimen Description BLOOD RIGHT WRIST  Final   Special Requests   Final    BOTTLES DRAWN AEROBIC AND ANAEROBIC Blood Culture results may not be optimal due to an inadequate volume of blood received in culture bottles   Culture   Final    NO GROWTH 4 DAYS Performed at [facility], [place] [address], [place], [place] [address]    Report Status PENDING  Incomplete  Culture, body fluid-bottle     Status: None (Preliminary result)   Collection Time: 02/01/18  9:22 AM  Result Value Ref Range Status   Specimen Description PLEURAL RIGHT  Final   Special Requests NONE  Final   Culture   Final    NO GROWTH 3 DAYS Performed at [place] [address], [place], [place] [address]    Report Status PENDING  Incomplete  Gram stain     Status: None   Collection Time: 02/01/18  9:22 AM  Result Value Ref Range Status   Specimen Description PLEURAL RIGHT  Final   Special Requests NONE  Final   Gram Stain   Final    FEW WBC PRESENT, PREDOMINANTLY PMN NO ORGANISMS SEEN Performed at [facility], [place] [address], [place], [place] [address]    Report Status 02/02/2018 FINAL  Final     Labs: BNP (last 3 results) Recent Labs    06/13/17 0126 12/01/17 1101  BNP >4,500.0* >5,056.9*   Basic Metabolic Panel: Recent Labs  Lab 01/31/18 0922 01/31/18 1742 02/01/18 0245 02/02/18 1442 02/04/18 1721 02/05/18 1344  NA 139  --  142 136 138  132*  K 4.0  --  5.4* 5.0 5.2* 4.6  CL 98  --  105 99 96* 96*  CO2 27  --  _0 GLUCOSE 78  --  58* 110* 65* 97  BUN 29*  --  38* 46* 25* 30*  CREATININE 7.89* 8.36* 9.25* 11.54* 9.31* 11.03*  CALCIUM 9.6  --  9.6 8.7* 8.3* 8.0*  MG  --   --   --   --  2.1 2.3  PHOS  --   --   --  8.6*  --   --     Liver Function Tests: Recent Labs  Lab 01/31/18 0922 02/02/18 1442 02/04/18 1721 02/05/18 1344  AST 24  --  30 25  ALT 11  --  11 9  ALKPHOS 105  --  77 99  BILITOT 1.0  --  1.4* 1.1  PROT 8.1  --  8.3* 7.3  ALBUMIN 3.0* 2.7* 3.1* 2.8*   Recent Labs  Lab 01/31/18 0922 02/02/18 1442  LIPASE 39 22   No results for input(s): AMMONIA in the last 168 hours. CBC: Recent Labs  Lab 01/31/18 0922 01/31/18 1742 02/02/18 1442 02/04/18 0258 02/05/18 1344  WBC 3.6* 3.4* 3.2* 3.2* 3.3*  HGB 11.3* 11.1* 11.0* 11.1* 11.1*  HCT 36.9* 35.7* 34.9* 36.0* 35.1*  MCV 86.2 85.4 85.1 85.9 85.0  PLT 126* 131* 123* 137* 158   Cardiac Enzymes: No results for input(s): CKTOTAL, CKMB, CKMBINDEX, TROPONINI in the last 168 hours. BNP: Invalid input(s): POCBNP CBG: Recent Labs  Lab 02/05/18 0016 02/05/18 1010 02/05/18 1045 02/05/18 1049 02/05/18 1155  GLUCAP 131* 45* 42* 44* 160*   D-Dimer No results for input(s): DDIMER in the last 72 hours. Hgb A1c No results for input(s): HGBA1C in the last 72 hours. Lipid Profile No results for input(s): CHOL, HDL, LDLCALC, TRIG, CHOLHDL, LDLDIRECT in the last 72 hours. Thyroid function studies No results for input(s): TSH, T4TOTAL, T3FREE, THYROIDAB in the last 72 hours.  Invalid input(s): FREET3 Anemia work up No results for input(s): VITAMINB12, FOLATE, FERRITIN, TIBC, IRON, RETICCTPCT in the last 72 hours. Urinalysis    Component Value Date/Time   COLORURINE YELLOW 10/18/2013 [place] 10/18/2013 0419   LABSPEC 1.008 10/18/2013 0419   PHURINE 8.5 (H) 10/18/2013 0419   GLUCOSEU 100 (A) 10/18/2013 0419   HGBUR TRACE (A) 10/18/2013 0419   BILIRUBINUR NEGATIVE 10/18/2013 0419   KETONESUR NEGATIVE 10/18/2013 0419   PROTEINUR 100 (A) 10/18/2013 0419   UROBILINOGEN 0.2 10/18/2013 0419   NITRITE NEGATIVE 10/18/2013 0419   LEUKOCYTESUR NEGATIVE 10/18/2013 0419   Sepsis Labs Invalid input(s): PROCALCITONIN,  WBC,   LACTICIDVEN Microbiology Recent Results (from the past 240 hour(s))  Blood culture (routine x 2)     Status: None (Preliminary result)   Collection Time: 01/31/18  9:25 AM  Result Value Ref Range Status   Specimen Description BLOOD RIGHT FOREARM  Final   Special Requests   Final    BOTTLES DRAWN AEROBIC AND ANAEROBIC Blood Culture results may not be optimal due to an inadequate volume of blood received in culture bottles   Culture   Final    NO GROWTH 4 DAYS Performed at [facility], [place] [address], [place], [place] [address]    Report Status PENDING  Incomplete  Blood culture (routine x 2)     Status: None (Preliminary result)   Collection Time: 01/31/18 12:50 PM  Result Value Ref Range Status  Specimen Description BLOOD RIGHT WRIST  Final   Special Requests   Final    BOTTLES DRAWN AEROBIC AND ANAEROBIC Blood Culture results may not be optimal due to an inadequate volume of blood received in culture bottles   Culture   Final    NO GROWTH 4 DAYS Performed at [facility], [place] [address] [place], [place] [address]    Report Status PENDING  Incomplete  Culture, body fluid-bottle     Status: None (Preliminary result)   Collection Time: 02/01/18  9:22 AM  Result Value Ref Range Status   Specimen Description PLEURAL RIGHT  Final   Special Requests NONE  Final   Culture   Final    NO GROWTH 3 DAYS Performed at [place] [address], [place], [place] [address]    Report Status PENDING  Incomplete  Gram stain     Status: None   Collection Time: 02/01/18  9:22 AM  Result Value Ref Range Status   Specimen Description PLEURAL RIGHT  Final   Special Requests NONE  Final   Gram Stain   Final    FEW WBC PRESENT, PREDOMINANTLY PMN NO ORGANISMS SEEN Performed at [facility], [place] [address], [place], [place] [address]    Report Status 02/02/2018 FINAL  Final     Time coordinating discharge: 40 minutes The [place] controlled substances registry was  reviewed for this patient prior to filling the <5 days supply controlled substances script, patient has no two prescriptions from same provider, no prescriptions longer than 10 days.      SIGNED:   [name], MD  [facility] 02/05/2018, 4:02 PM

## 2018-02-05 NOTE — Care Management Important Message (Signed)
Important Message  Patient Details  Name: Frank [name] MRN: [id number] Date of Birth: Dec 02, 1965   Medicare Important Message Given:       [name] 02/05/2018, 2:27 PM

## 2018-02-06 DIAGNOSIS — N186 End stage renal disease: Secondary | ICD-10-CM | POA: Diagnosis not present

## 2018-02-06 DIAGNOSIS — Z992 Dependence on renal dialysis: Secondary | ICD-10-CM | POA: Diagnosis not present

## 2018-02-06 DIAGNOSIS — T8610 Unspecified complication of kidney transplant: Secondary | ICD-10-CM | POA: Diagnosis not present

## 2018-02-06 LAB — GLUCOSE, CAPILLARY: Glucose-Capillary: 42 mg/dL — CL (ref 70–99)

## 2018-02-06 LAB — CULTURE, BODY FLUID W GRAM STAIN -BOTTLE: Culture: NO GROWTH

## 2018-02-06 LAB — CULTURE, BODY FLUID-BOTTLE

## 2018-02-06 NOTE — Progress Notes (Signed)
Patient refused to allow nurse tech to take him to the [facility], so he could wait to be picked up by the cab. Pt insisted that he be taken to the ED instead to wait for the cab. Nurse Tech informed the patient that the cab will be waiting for him at the the [facility]	[place]. Nurse Tech also informed the patient that the cab will leave him if he is not present when they arrive at the hospital. The cab driver and cab company was called to see if they could go pick the patient up in the ED instead.

## 2018-02-07 IMAGING — DX DG CHEST 2V
2 series · 2 of 2 positions shown · non-contrast
Comparison: Chest radiograph from 09/16/2015

CLINICAL DATA: Acute onset of shortness of breath, nausea and
vomiting. Mid chest pain and cough. Initial encounter.

EXAM:
CHEST  2 VIEW

[chest pa]
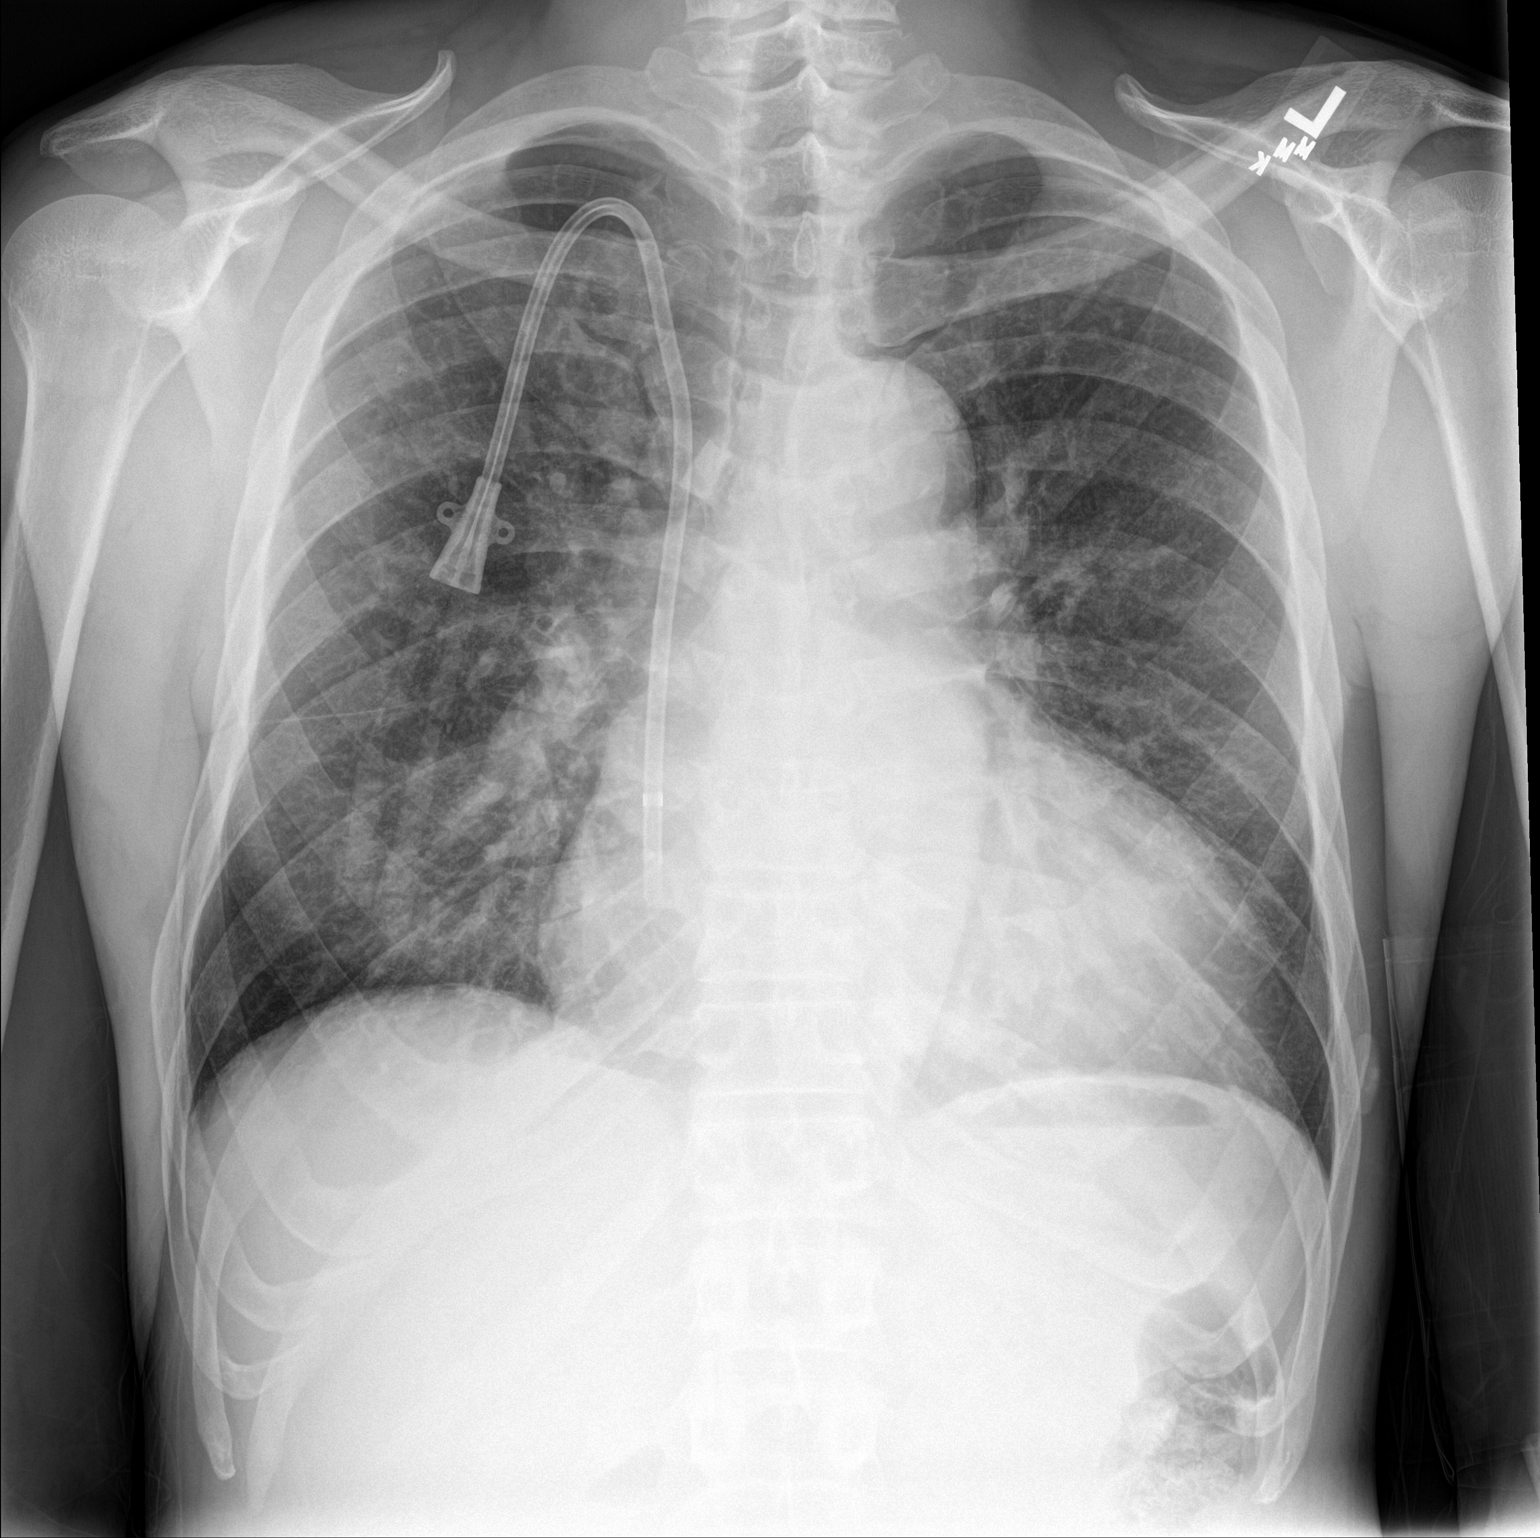

[chest lat]
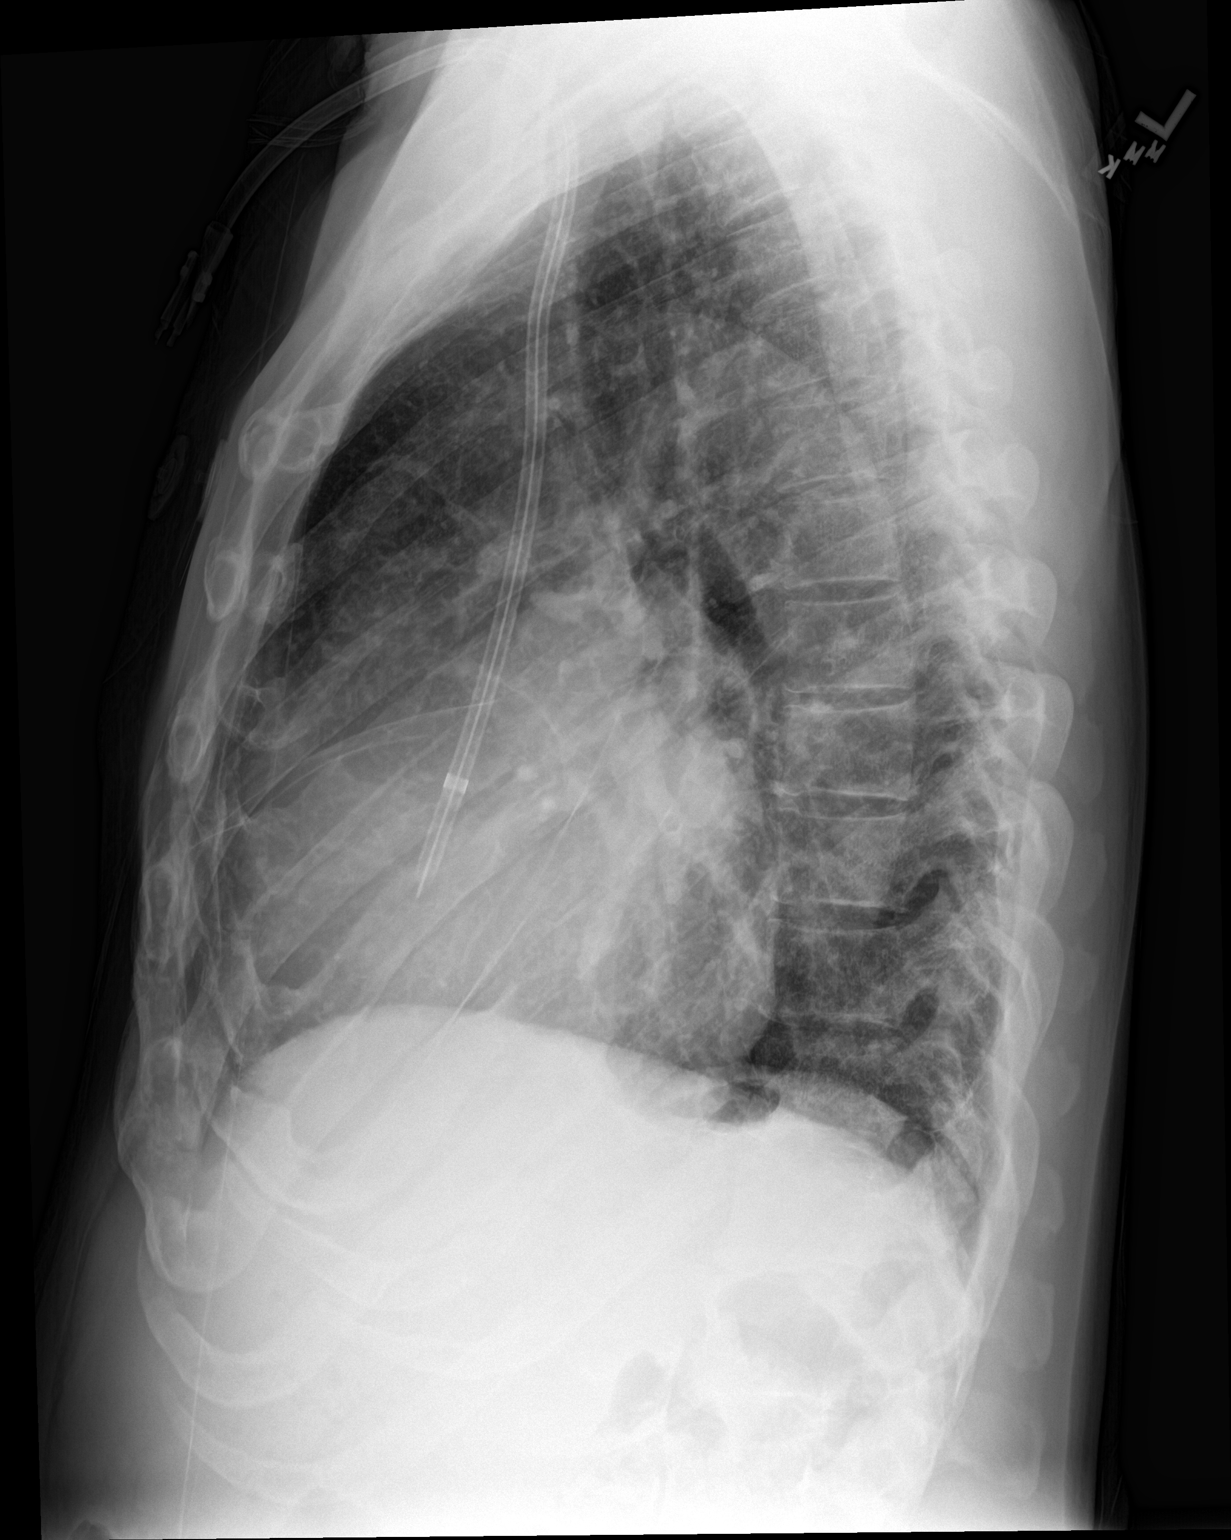

[2 of 2 positions shown; findings below may reference images not displayed]

FINDINGS: Vascular congestion is noted. Mildly increased interstitial markings
raise concern for mild interstitial edema. No pleural effusion or
pneumothorax is seen.

The cardiomediastinal silhouette is enlarged. No acute osseous
abnormalities are identified. A right-sided dual-lumen catheter is
noted ending within the right atrium.
IMPRESSION: Vascular congestion and cardiomegaly. Mildly increased interstitial
markings raise concern for mild interstitial edema.

## 2018-02-09 ENCOUNTER — Other Ambulatory Visit: Payer: Self-pay

## 2018-02-09 ENCOUNTER — Emergency Department (HOSPITAL_COMMUNITY): Payer: Medicare Other

## 2018-02-09 ENCOUNTER — Emergency Department (HOSPITAL_COMMUNITY)
Admission: EM | Admit: 2018-02-09 | Discharge: 2018-02-09 | Disposition: A | Discharge status: Home / Self Care | Payer: Medicare Other | Attending: Emergency Medicine | Admitting: Emergency Medicine

## 2018-02-09 DIAGNOSIS — N186 End stage renal disease: Secondary | ICD-10-CM | POA: Insufficient documentation

## 2018-02-09 DIAGNOSIS — I5042 Chronic combined systolic (congestive) and diastolic (congestive) heart failure: Secondary | ICD-10-CM | POA: Insufficient documentation

## 2018-02-09 DIAGNOSIS — R0602 Shortness of breath: Secondary | ICD-10-CM | POA: Diagnosis not present

## 2018-02-09 DIAGNOSIS — F1210 Cannabis abuse, uncomplicated: Secondary | ICD-10-CM | POA: Diagnosis not present

## 2018-02-09 DIAGNOSIS — I132 Hypertensive heart and chronic kidney disease with heart failure and with stage 5 chronic kidney disease, or end stage renal disease: Secondary | ICD-10-CM | POA: Insufficient documentation

## 2018-02-09 DIAGNOSIS — R0789 Other chest pain: Secondary | ICD-10-CM | POA: Diagnosis not present

## 2018-02-09 DIAGNOSIS — Z7901 Long term (current) use of anticoagulants: Secondary | ICD-10-CM | POA: Diagnosis not present

## 2018-02-09 DIAGNOSIS — I10 Essential (primary) hypertension: Secondary | ICD-10-CM | POA: Diagnosis not present

## 2018-02-09 DIAGNOSIS — N2581 Secondary hyperparathyroidism of renal origin: Secondary | ICD-10-CM | POA: Diagnosis not present

## 2018-02-09 DIAGNOSIS — G894 Chronic pain syndrome: Secondary | ICD-10-CM | POA: Insufficient documentation

## 2018-02-09 DIAGNOSIS — Z79899 Other long term (current) drug therapy: Secondary | ICD-10-CM | POA: Insufficient documentation

## 2018-02-09 DIAGNOSIS — R457 State of emotional shock and stress, unspecified: Secondary | ICD-10-CM | POA: Diagnosis not present

## 2018-02-09 DIAGNOSIS — D509 Iron deficiency anemia, unspecified: Secondary | ICD-10-CM | POA: Diagnosis not present

## 2018-02-09 DIAGNOSIS — Z992 Dependence on renal dialysis: Secondary | ICD-10-CM | POA: Insufficient documentation

## 2018-02-09 DIAGNOSIS — Z87891 Personal history of nicotine dependence: Secondary | ICD-10-CM | POA: Diagnosis not present

## 2018-02-09 DIAGNOSIS — R079 Chest pain, unspecified: Secondary | ICD-10-CM | POA: Diagnosis not present

## 2018-02-09 DIAGNOSIS — D631 Anemia in chronic kidney disease: Secondary | ICD-10-CM | POA: Diagnosis not present

## 2018-02-09 DIAGNOSIS — R0902 Hypoxemia: Secondary | ICD-10-CM | POA: Diagnosis not present

## 2018-02-09 DIAGNOSIS — E1122 Type 2 diabetes mellitus with diabetic chronic kidney disease: Secondary | ICD-10-CM | POA: Insufficient documentation

## 2018-02-09 LAB — TROPONIN I
TROPONIN I: 0.03 ng/mL — AB (ref <0.03)
TROPONIN I: 0.03 ng/mL — AB (ref <0.03)

## 2018-02-09 LAB — I-STAT CHEM 8, ED
BUN: 32 mg/dL — ABNORMAL HIGH (ref 6–20)
CALCIUM ION: 0.94 mmol/L — AB (ref 1.15–1.40)
CHLORIDE: 100 mmol/L (ref 98–111)
Creatinine, Ser: 10.2 mg/dL — ABNORMAL HIGH (ref 0.61–1.24)
Glucose, Bld: 73 mg/dL (ref 70–99)
HCT: 41 % (ref 39.0–52.0)
Hemoglobin: 13.9 g/dL (ref 13.0–17.0)
POTASSIUM: 4.1 mmol/L (ref 3.5–5.1)
SODIUM: 137 mmol/L (ref 135–145)
TCO2: 25 mmol/L (ref 22–32)

## 2018-02-09 LAB — CBC
HCT: 36.9 % — ABNORMAL LOW (ref 39.0–52.0)
HEMOGLOBIN: 11.6 g/dL — AB (ref 13.0–17.0)
MCH: 26.9 pg (ref 26.0–34.0)
MCHC: 31.4 g/dL (ref 30.0–36.0)
MCV: 85.6 fL (ref 78.0–100.0)
Platelets: 141 10*3/uL — ABNORMAL LOW (ref 150–400)
RBC: 4.31 MIL/uL (ref 4.22–5.81)
RDW: 17.4 % — ABNORMAL HIGH (ref 11.5–15.5)
WBC: 3.9 10*3/uL — AB (ref 4.0–10.5)

## 2018-02-09 LAB — MAGNESIUM: MAGNESIUM: 2.1 mg/dL (ref 1.7–2.4)

## 2018-02-09 LAB — BASIC METABOLIC PANEL
ANION GAP: 14 (ref 5–15)
BUN: 30 mg/dL — ABNORMAL HIGH (ref 6–20)
CALCIUM: 8.2 mg/dL — AB (ref 8.9–10.3)
CO2: 25 mmol/L (ref 22–32)
Chloride: 98 mmol/L (ref 98–111)
Creatinine, Ser: 9.65 mg/dL — ABNORMAL HIGH (ref 0.61–1.24)
GFR calc non Af Amer: 5 mL/min — ABNORMAL LOW (ref >60)
GFR, EST AFRICAN AMERICAN: 6 mL/min — AB (ref >60)
GLUCOSE: 73 mg/dL (ref 70–99)
Potassium: 4.2 mmol/L (ref 3.5–5.1)
SODIUM: 137 mmol/L (ref 135–145)

## 2018-02-09 LAB — BRAIN NATRIURETIC PEPTIDE

## 2018-02-09 LAB — I-STAT TROPONIN, ED: TROPONIN I, POC: 0.02 ng/mL (ref 0.00–0.08)

## 2018-02-09 IMAGING — DX DG CHEST 2V
2 series · 2 of 2 positions shown · non-contrast
Comparison: September 19, 2015.

CLINICAL DATA: Shortness of breath.

EXAM:
CHEST  2 VIEW

[w chest pa]
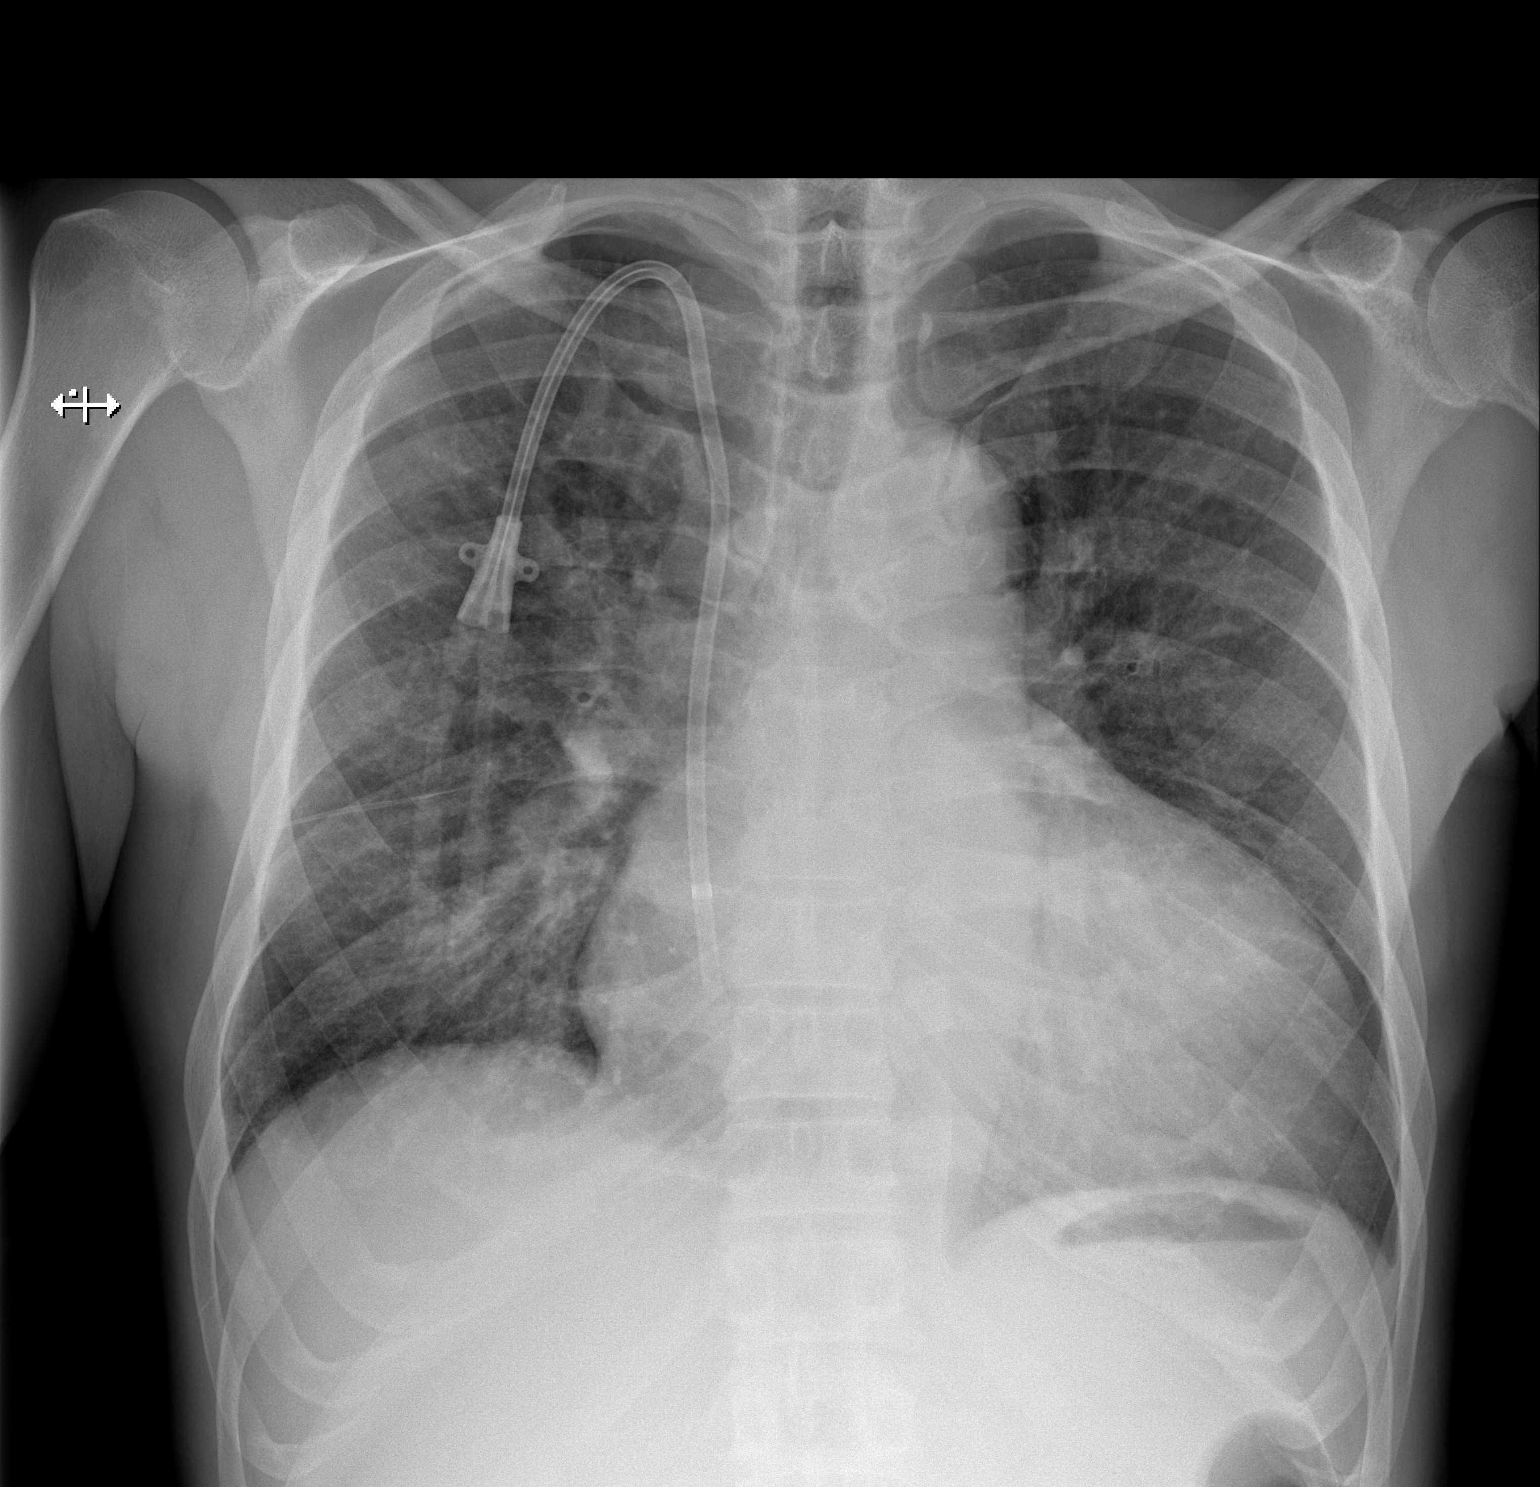

[w chest lat]
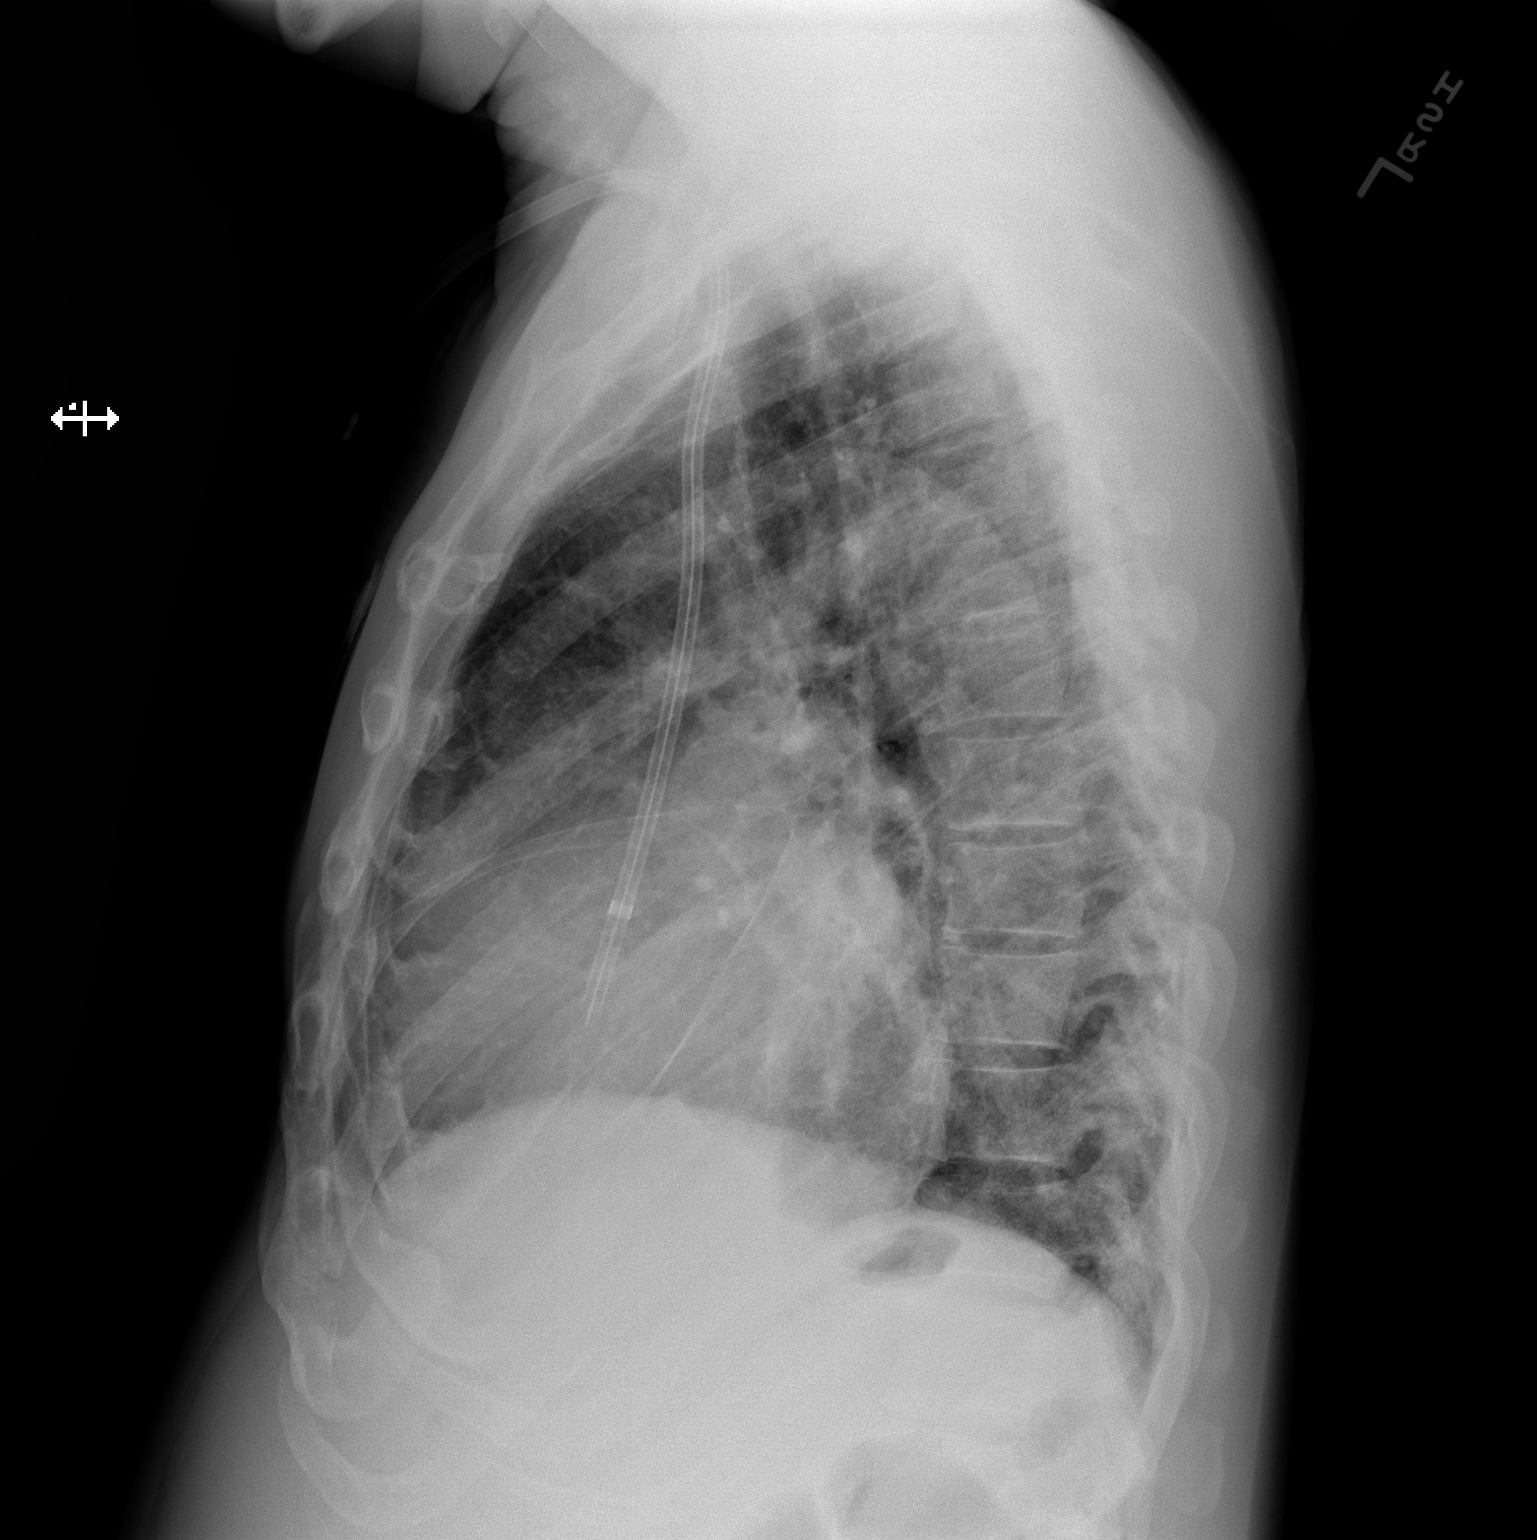

[2 of 2 positions shown; findings below may reference images not displayed]

FINDINGS: Stable cardiomegaly. Right internal jugular dialysis catheter is
unchanged with distal tip in expected position of right atrium. No
pneumothorax or pleural effusion is noted. No acute pulmonary
disease is noted. Mild central pulmonary vascular congestion is
noted. Bony thorax is unremarkable.
IMPRESSION: Stable cardiomegaly with central pulmonary vascular congestion.

## 2018-02-09 MED ORDER — ALUM & MAG HYDROXIDE-SIMETH 200-200-20 MG/5ML PO SUSP
30.0000 mL | Freq: Once | ORAL | Status: AC
  Administered 2018-02-09: 30 mL via ORAL
  Filled 2018-02-09: qty 30

## 2018-02-09 MED ORDER — LIDOCAINE VISCOUS HCL 2 % MT SOLN
15.0000 mL | Freq: Once | OROMUCOSAL | Status: AC
  Administered 2018-02-09: 15 mL via OROMUCOSAL
  Filled 2018-02-09: qty 15

## 2018-02-09 MED ORDER — HYDROMORPHONE HCL 1 MG/ML IJ SOLN
1.0000 mg | Freq: Once | INTRAMUSCULAR | Status: AC
  Administered 2018-02-09: 1 mg via INTRAVENOUS
  Filled 2018-02-09: qty 1

## 2018-02-09 MED ORDER — NITROGLYCERIN 0.4 MG SL SUBL: 0.4000 mg | SUBLINGUAL_TABLET | SUBLINGUAL | Status: DC | PRN

## 2018-02-09 MED ORDER — LABETALOL HCL 5 MG/ML IV SOLN
10.0000 mg | Freq: Once | INTRAVENOUS | Status: DC
  Filled 2018-02-09: qty 4

## 2018-02-09 MED ORDER — OXYCODONE HCL 5 MG PO TABS
5.0000 mg | ORAL_TABLET | Freq: Once | ORAL | Status: AC
  Administered 2018-02-09: 5 mg via ORAL
  Filled 2018-02-09: qty 1

## 2018-02-09 MED ORDER — LORAZEPAM 2 MG/ML IJ SOLN
1.0000 mg | Freq: Once | INTRAMUSCULAR | Status: AC
  Administered 2018-02-09: 1 mg via INTRAVENOUS
  Filled 2018-02-09: qty 1

## 2018-02-09 MED ORDER — OXYCODONE HCL 5 MG PO TABS: 10.0000 mg | ORAL_TABLET | Freq: Once | ORAL | Status: DC

## 2018-02-09 MED ORDER — LISINOPRIL 10 MG PO TABS
5.0000 mg | ORAL_TABLET | Freq: Once | ORAL | Status: AC
  Administered 2018-02-09: 5 mg via ORAL
  Filled 2018-02-09: qty 1

## 2018-02-09 NOTE — ED Triage Notes (Signed)
Pt in via [facility], per report pt had mid radiaiting CP to L arm onset today while at HD where he did not get tx, last HD tx 5 days ago, pt reports recent recent nausea and diarrhea, pt very anxious, pt A&O x4, NSR in route, BP 170/110

## 2018-02-09 NOTE — ED Notes (Signed)
Pt continually removing EKG leads and O2 sat probe, pt encouraged to remain in bed so that we can monitor him, pt verbalizing understanding
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
## 2018-02-09 NOTE — Discharge Instructions (Addendum)
We saw you in the ER for the chest pain/shortness of breath. All of our cardiac workup is normal, including labs, EKG and chest X-RAY are normal. We are not sure what is causing your discomfort, but we feel comfortable sending you home at this time. The workup in the ER is not complete, and you should follow up with your primary care doctor for further evaluation.  Please return to the ER if you have worsening chest pain, shortness of breath, pain radiating to your jaw, shoulder, or back, sweats or fainting. Otherwise see the Cardiologist or your primary care doctor as requested.  

## 2018-02-09 NOTE — ED Notes (Signed)
Unable to gain IV Access x 2, charge rn to attempt EJ, pt aware

## 2018-02-09 NOTE — ED Notes (Signed)
Pt given warmed [place] sandwich, sprite, and cab voucher to take him back to his car at dialysis; IV removed at this time

## 2018-02-09 NOTE — ED Notes (Signed)
Lab specimen sent down.

## 2018-02-09 NOTE — ED Notes (Signed)
Pt refused to put pulse ox. On and refused to put gown on as well.

## 2018-02-09 NOTE — ED Notes (Signed)
Frank Gala, Rn attempted IV access x 2, unable to gain IV access, Isaacs, MD to attempt EJ or US guided IV placement

## 2018-02-10 ENCOUNTER — Encounter (HOSPITAL_COMMUNITY): Payer: Self-pay | Admitting: Internal Medicine

## 2018-02-10 ENCOUNTER — Other Ambulatory Visit: Payer: Self-pay

## 2018-02-10 ENCOUNTER — Emergency Department (HOSPITAL_COMMUNITY)
Admission: EM | Admit: 2018-02-10 | Discharge: 2018-02-11 | Discharge status: Against Medical Advice | Payer: Medicare Other | Source: Home / Self Care | Attending: Emergency Medicine | Admitting: Emergency Medicine

## 2018-02-10 ENCOUNTER — Encounter (HOSPITAL_COMMUNITY): Payer: Self-pay | Admitting: Emergency Medicine

## 2018-02-10 ENCOUNTER — Emergency Department (HOSPITAL_COMMUNITY)
Admission: EM | Admit: 2018-02-10 | Discharge: 2018-02-10 | Disposition: A | Discharge status: Home / Self Care | Payer: Medicare Other | Source: Home / Self Care | Attending: Emergency Medicine | Admitting: Emergency Medicine

## 2018-02-10 DIAGNOSIS — F1210 Cannabis abuse, uncomplicated: Secondary | ICD-10-CM

## 2018-02-10 DIAGNOSIS — I5042 Chronic combined systolic (congestive) and diastolic (congestive) heart failure: Secondary | ICD-10-CM

## 2018-02-10 DIAGNOSIS — I132 Hypertensive heart and chronic kidney disease with heart failure and with stage 5 chronic kidney disease, or end stage renal disease: Secondary | ICD-10-CM

## 2018-02-10 DIAGNOSIS — N186 End stage renal disease: Secondary | ICD-10-CM

## 2018-02-10 DIAGNOSIS — M79602 Pain in left arm: Secondary | ICD-10-CM | POA: Insufficient documentation

## 2018-02-10 DIAGNOSIS — R05 Cough: Secondary | ICD-10-CM | POA: Diagnosis not present

## 2018-02-10 DIAGNOSIS — K828 Other specified diseases of gallbladder: Secondary | ICD-10-CM | POA: Diagnosis not present

## 2018-02-10 DIAGNOSIS — Z992 Dependence on renal dialysis: Secondary | ICD-10-CM | POA: Insufficient documentation

## 2018-02-10 DIAGNOSIS — Z79899 Other long term (current) drug therapy: Secondary | ICD-10-CM

## 2018-02-10 DIAGNOSIS — R0602 Shortness of breath: Secondary | ICD-10-CM

## 2018-02-10 DIAGNOSIS — E8779 Other fluid overload: Secondary | ICD-10-CM | POA: Diagnosis not present

## 2018-02-10 DIAGNOSIS — Z87891 Personal history of nicotine dependence: Secondary | ICD-10-CM | POA: Insufficient documentation

## 2018-02-10 DIAGNOSIS — R0789 Other chest pain: Secondary | ICD-10-CM | POA: Insufficient documentation

## 2018-02-10 DIAGNOSIS — R110 Nausea: Secondary | ICD-10-CM

## 2018-02-10 DIAGNOSIS — Z7901 Long term (current) use of anticoagulants: Secondary | ICD-10-CM | POA: Insufficient documentation

## 2018-02-10 DIAGNOSIS — I429 Cardiomyopathy, unspecified: Secondary | ICD-10-CM | POA: Diagnosis not present

## 2018-02-10 DIAGNOSIS — I82611 Acute embolism and thrombosis of superficial veins of right upper extremity: Secondary | ICD-10-CM | POA: Diagnosis not present

## 2018-02-10 DIAGNOSIS — R0902 Hypoxemia: Secondary | ICD-10-CM | POA: Diagnosis not present

## 2018-02-10 DIAGNOSIS — R2232 Localized swelling, mass and lump, left upper limb: Secondary | ICD-10-CM

## 2018-02-10 DIAGNOSIS — R1011 Right upper quadrant pain: Secondary | ICD-10-CM | POA: Diagnosis not present

## 2018-02-10 DIAGNOSIS — Z5320 Procedure and treatment not carried out because of patient's decision for unspecified reasons: Secondary | ICD-10-CM

## 2018-02-10 DIAGNOSIS — E1122 Type 2 diabetes mellitus with diabetic chronic kidney disease: Secondary | ICD-10-CM | POA: Insufficient documentation

## 2018-02-10 DIAGNOSIS — F419 Anxiety disorder, unspecified: Secondary | ICD-10-CM | POA: Insufficient documentation

## 2018-02-10 DIAGNOSIS — E875 Hyperkalemia: Secondary | ICD-10-CM

## 2018-02-10 LAB — I-STAT CHEM 8, ED
BUN: 43 mg/dL — AB (ref 6–20)
BUN: 59 mg/dL — AB (ref 6–20)
CHLORIDE: 100 mmol/L (ref 98–111)
CHLORIDE: 103 mmol/L (ref 98–111)
Calcium, Ion: 0.98 mmol/L — ABNORMAL LOW (ref 1.15–1.40)
Calcium, Ion: 0.98 mmol/L — ABNORMAL LOW (ref 1.15–1.40)
Creatinine, Ser: 11.6 mg/dL — ABNORMAL HIGH (ref 0.61–1.24)
Creatinine, Ser: 12.4 mg/dL — ABNORMAL HIGH (ref 0.61–1.24)
GLUCOSE: 99 mg/dL (ref 70–99)
Glucose, Bld: 57 mg/dL — ABNORMAL LOW (ref 70–99)
HCT: 40 % (ref 39.0–52.0)
HEMATOCRIT: 47 % (ref 39.0–52.0)
HEMOGLOBIN: 16 g/dL (ref 13.0–17.0)
Hemoglobin: 13.6 g/dL (ref 13.0–17.0)
POTASSIUM: 5.4 mmol/L — AB (ref 3.5–5.1)
POTASSIUM: 6.2 mmol/L — AB (ref 3.5–5.1)
Sodium: 136 mmol/L (ref 135–145)
Sodium: 139 mmol/L (ref 135–145)
TCO2: 25 mmol/L (ref 22–32)
TCO2: 24 mmol/L (ref 22–32)

## 2018-02-10 LAB — BASIC METABOLIC PANEL
ANION GAP: 15 (ref 5–15)
BUN: 43 mg/dL — ABNORMAL HIGH (ref 6–20)
CHLORIDE: 99 mmol/L (ref 98–111)
CO2: 24 mmol/L (ref 22–32)
Calcium: 8.5 mg/dL — ABNORMAL LOW (ref 8.9–10.3)
Creatinine, Ser: 11.25 mg/dL — ABNORMAL HIGH (ref 0.61–1.24)
GFR calc non Af Amer: 5 mL/min — ABNORMAL LOW (ref >60)
GFR, EST AFRICAN AMERICAN: 5 mL/min — AB (ref >60)
Glucose, Bld: 102 mg/dL — ABNORMAL HIGH (ref 70–99)
POTASSIUM: 5.6 mmol/L — AB (ref 3.5–5.1)
Sodium: 138 mmol/L (ref 135–145)

## 2018-02-10 LAB — CBC WITH DIFFERENTIAL/PLATELET
Abs Immature Granulocytes: 0 10*3/uL (ref 0.0–0.1)
Basophils Absolute: 0 10*3/uL (ref 0.0–0.1)
Basophils Relative: 1 %
Eosinophils Absolute: 0.2 10*3/uL (ref 0.0–0.7)
Eosinophils Relative: 5 %
HEMATOCRIT: 37.6 % — AB (ref 39.0–52.0)
HEMOGLOBIN: 11.6 g/dL — AB (ref 13.0–17.0)
IMMATURE GRANULOCYTES: 1 %
LYMPHS ABS: 0.6 10*3/uL — AB (ref 0.7–4.0)
LYMPHS PCT: 18 %
MCH: 26.4 pg (ref 26.0–34.0)
MCHC: 30.9 g/dL (ref 30.0–36.0)
MCV: 85.6 fL (ref 78.0–100.0)
MONOS PCT: 12 %
Monocytes Absolute: 0.4 10*3/uL (ref 0.1–1.0)
NEUTROS PCT: 63 %
Neutro Abs: 2.2 10*3/uL (ref 1.7–7.7)
Platelets: 149 10*3/uL — ABNORMAL LOW (ref 150–400)
RBC: 4.39 MIL/uL (ref 4.22–5.81)
RDW: 17.6 % — ABNORMAL HIGH (ref 11.5–15.5)
WBC: 3.4 10*3/uL — ABNORMAL LOW (ref 4.0–10.5)

## 2018-02-10 LAB — I-STAT TROPONIN, ED: Troponin i, poc: 0.04 ng/mL (ref 0.00–0.08)

## 2018-02-10 MED ORDER — KETOROLAC TROMETHAMINE 30 MG/ML IJ SOLN
30.0000 mg | Freq: Once | INTRAMUSCULAR | Status: AC
  Administered 2018-02-10: 30 mg via INTRAMUSCULAR

## 2018-02-10 MED ORDER — ONDANSETRON HCL 4 MG PO TABS
4.0000 mg | ORAL_TABLET | Freq: Once | ORAL | Status: AC
  Administered 2018-02-10: 4 mg via ORAL
  Filled 2018-02-10: qty 1

## 2018-02-10 MED ORDER — HYDROXYZINE HCL 10 MG PO TABS
10.0000 mg | ORAL_TABLET | Freq: Once | ORAL | Status: AC
  Administered 2018-02-10: 10 mg via ORAL
  Filled 2018-02-10: qty 1

## 2018-02-10 MED ORDER — HYDROMORPHONE HCL 1 MG/ML IJ SOLN
1.0000 mg | Freq: Once | INTRAMUSCULAR | Status: AC
Start: 2018-02-10 — End: 2018-02-10
  Administered 2018-02-10: 1 mg via INTRAVENOUS
  Filled 2018-02-10: qty 1

## 2018-02-10 MED ORDER — ONDANSETRON HCL 4 MG/2ML IJ SOLN: 4.0000 mg | Freq: Once | INTRAMUSCULAR | Status: DC

## 2018-02-10 MED ORDER — KETOROLAC TROMETHAMINE 30 MG/ML IJ SOLN
30.0000 mg | Freq: Once | INTRAMUSCULAR | Status: DC
  Filled 2018-02-10: qty 1

## 2018-02-10 NOTE — ED Notes (Signed)
Pt given ginger ale with ice.

## 2018-02-10 NOTE — ED Notes (Signed)
Pt requesting "ice with a side of apple juice." This RN made patient aware that per Gertie Fey, PA-C, pt must remain NPO until after labs result to rule out life threatening emergency. Pt verbalizes understanding.

## 2018-02-10 NOTE — ED Notes (Signed)
ED Provider at bedside. 

## 2018-02-10 NOTE — Progress Notes (Addendum)
Late Post 4 PM: CSW met with pt earlier today around 4 pm during ED visit. CSW and RNCM discussed transportation to dialysis. Pt currently drives himself to dialysis. Provided pt with information about Medicaid transportation. Per dialysis center, pt is scheduled for dialysis Monday, Wednesday, Friday. Pt confirmed schedule and stated that he is scheduled for dialysis at 12 pm tomorrow. Pt stated he will follow up with dialysis center social worker about PCP referral.   Wendelyn Breslow, [facility]  [phone]

## 2018-02-10 NOTE — ED Notes (Signed)
This RN explained to the patient that she needed to draw labs off of his ultrasound IV. Pt states "f*ck this. This is taking forever. I'm going to go to another hospital. How dare the doctor let me sit here in this pain. Y'all haven't provided me any comfort at all." This RN explained to the patient the process and communicated the patient's concerns to [facility]. No new orders for pain medication at this time. IV will not pull back blood. Will contact phlebotomy to stick patient for labs.

## 2018-02-10 NOTE — ED Notes (Signed)
Gerald Stabs, RN attempted to gain IV access and draw labs x2 unsuccessfully. Will place IV team consult.

## 2018-02-10 NOTE — ED Notes (Signed)
Renal tray ordered, pt given warm [place] sandwich upon request

## 2018-02-10 NOTE — ED Notes (Addendum)
Pt refusing to let me take vitals Nurse notified.

## 2018-02-10 NOTE — ED Notes (Signed)
Last 02 sat was taken with Pt. on 2L 02 not room air

## 2018-02-10 NOTE — ED Notes (Signed)
Phlebotomy aware of need for redraw

## 2018-02-10 NOTE — ED Notes (Signed)
Iv team at bedside  
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
## 2018-02-10 NOTE — ED Notes (Signed)
Pt states "go ahead and get your IV stuff ready. I already know the doctor wants it."

## 2018-02-10 NOTE — ED Notes (Signed)
Pt states "I can't breathe and I need oxygen. I wear it all the time at home." Pt placed on 2L O2.

## 2018-02-10 NOTE — ED Notes (Signed)
IV team bedside.

## 2018-02-10 NOTE — ED Triage Notes (Signed)
Pt here for dialysis; was told to make an appt to be dialyzed tomorrow (wed) but the office is already booked; pt states he was here earlier and told his potassium was 5.4 and he is concerned that he will not last, requesting dialysis

## 2018-02-10 NOTE — ED Notes (Signed)
Patient walked out to nurses station requesting to speak with charge nurse. Agricultural consultant at bedside.

## 2018-02-10 NOTE — ED Notes (Signed)
Phlebotomy at bedside.

## 2018-02-10 NOTE — ED Triage Notes (Signed)
Pt here with multiple complaints (shortness of breath, nausea, vomiting, anxiety, back pain, and chest pain). Chest pain 9/10 since yesterday evening. Pt is not in distress at this time. Vital signs stable at this time.

## 2018-02-10 NOTE — Care Management (Signed)
ED CM and CSW met with patient in hallway Pod D Patient reports needing PCP. Patient is known to this CM patient is on HD and have been set up with [facility] in the past but have been discharged for No Show. Patient states, he was admitted in the hospital when appointment were scheduled. Patient is at [employer] at [employer] . CM contacted [facility] team [phone] spoke with Elmyra Ricks who contacted the HD center and was informed that patient has open HD spot for tomorrow at 12n patient can attend HD then. Patient updated. CM provided patient wiith resources for transportation and [facility]. CM will contact the Clinic's CM concerning an appointment to establish care  And discussed [place] [facility]. Patient verbalized understanding teach back done. Updated Dr. Kathrynn Humble

## 2018-02-10 NOTE — Progress Notes (Signed)
Called and spoke with Lovena Le RN and made her aware that the patient was previously visited by IV team earlier this morning and ultrasound was used and PIV was successful. Another IV team RN attempted on the patient with ultrasound and was unsuccessful. RN Lovena Le was encouraged to ask the Doctor if there was any other alternatives that could be used at this time.

## 2018-02-10 NOTE — ED Notes (Signed)
This RN attempted to gain IV access and draw labs x1. Pt is difficult stick.

## 2018-02-10 NOTE — ED Notes (Signed)
Pt states that he wants to go to dialysis, pt requesting lab results, advised pt that a physician has to give results. Pt given [place] sandwich and apple juice

## 2018-02-10 NOTE — ED Notes (Signed)
Pt came out of room to go to bathroom pt seemed a little sob asked pt to return to room to use urinal pt stated he wanted to go to bathroom got wheelchair and wheeled pt to bathroom. Notified nurse Sharyn Lull - RN

## 2018-02-10 NOTE — ED Notes (Addendum)
All blood sent to lab has clotted, EDP made aware

## 2018-02-10 NOTE — Discharge Instructions (Addendum)
Please follow up for your scheduled dialysis session tomorrow.  Use number below to call and follow up with a primary care provider for further management of your health.

## 2018-02-11 ENCOUNTER — Emergency Department (HOSPITAL_COMMUNITY): Payer: Medicare Other

## 2018-02-11 DIAGNOSIS — Z992 Dependence on renal dialysis: Secondary | ICD-10-CM | POA: Diagnosis not present

## 2018-02-11 DIAGNOSIS — Z9989 Dependence on other enabling machines and devices: Secondary | ICD-10-CM | POA: Diagnosis not present

## 2018-02-11 DIAGNOSIS — K828 Other specified diseases of gallbladder: Secondary | ICD-10-CM | POA: Diagnosis not present

## 2018-02-11 DIAGNOSIS — I440 Atrioventricular block, first degree: Secondary | ICD-10-CM | POA: Diagnosis not present

## 2018-02-11 DIAGNOSIS — R109 Unspecified abdominal pain: Secondary | ICD-10-CM | POA: Diagnosis not present

## 2018-02-11 DIAGNOSIS — R001 Bradycardia, unspecified: Secondary | ICD-10-CM | POA: Diagnosis not present

## 2018-02-11 DIAGNOSIS — Z888 Allergy status to other drugs, medicaments and biological substances status: Secondary | ICD-10-CM | POA: Diagnosis not present

## 2018-02-11 DIAGNOSIS — M5440 Lumbago with sciatica, unspecified side: Secondary | ICD-10-CM | POA: Diagnosis not present

## 2018-02-11 DIAGNOSIS — E119 Type 2 diabetes mellitus without complications: Secondary | ICD-10-CM | POA: Diagnosis not present

## 2018-02-11 DIAGNOSIS — I517 Cardiomegaly: Secondary | ICD-10-CM | POA: Diagnosis not present

## 2018-02-11 DIAGNOSIS — Z7901 Long term (current) use of anticoagulants: Secondary | ICD-10-CM | POA: Diagnosis not present

## 2018-02-11 DIAGNOSIS — Z79899 Other long term (current) drug therapy: Secondary | ICD-10-CM | POA: Diagnosis not present

## 2018-02-11 DIAGNOSIS — R0602 Shortness of breath: Secondary | ICD-10-CM | POA: Diagnosis not present

## 2018-02-11 DIAGNOSIS — G8929 Other chronic pain: Secondary | ICD-10-CM | POA: Diagnosis not present

## 2018-02-11 DIAGNOSIS — R9431 Abnormal electrocardiogram [ECG] [EKG]: Secondary | ICD-10-CM | POA: Diagnosis not present

## 2018-02-11 DIAGNOSIS — R112 Nausea with vomiting, unspecified: Secondary | ICD-10-CM | POA: Diagnosis not present

## 2018-02-11 DIAGNOSIS — I132 Hypertensive heart and chronic kidney disease with heart failure and with stage 5 chronic kidney disease, or end stage renal disease: Secondary | ICD-10-CM | POA: Diagnosis not present

## 2018-02-11 DIAGNOSIS — R188 Other ascites: Secondary | ICD-10-CM | POA: Diagnosis not present

## 2018-02-11 DIAGNOSIS — E875 Hyperkalemia: Secondary | ICD-10-CM | POA: Diagnosis not present

## 2018-02-11 DIAGNOSIS — Z9115 Patient's noncompliance with renal dialysis: Secondary | ICD-10-CM | POA: Diagnosis not present

## 2018-02-11 DIAGNOSIS — F419 Anxiety disorder, unspecified: Secondary | ICD-10-CM | POA: Diagnosis not present

## 2018-02-11 DIAGNOSIS — I10 Essential (primary) hypertension: Secondary | ICD-10-CM | POA: Diagnosis not present

## 2018-02-11 DIAGNOSIS — E162 Hypoglycemia, unspecified: Secondary | ICD-10-CM | POA: Diagnosis not present

## 2018-02-11 DIAGNOSIS — R410 Disorientation, unspecified: Secondary | ICD-10-CM | POA: Diagnosis not present

## 2018-02-11 DIAGNOSIS — M545 Low back pain: Secondary | ICD-10-CM | POA: Diagnosis not present

## 2018-02-11 DIAGNOSIS — I509 Heart failure, unspecified: Secondary | ICD-10-CM | POA: Diagnosis not present

## 2018-02-11 DIAGNOSIS — N186 End stage renal disease: Secondary | ICD-10-CM | POA: Diagnosis not present

## 2018-02-11 DIAGNOSIS — T8612 Kidney transplant failure: Secondary | ICD-10-CM | POA: Diagnosis not present

## 2018-02-11 DIAGNOSIS — E161 Other hypoglycemia: Secondary | ICD-10-CM | POA: Diagnosis not present

## 2018-02-11 LAB — CBG MONITORING, ED
GLUCOSE-CAPILLARY: 57 mg/dL — AB (ref 70–99)
Glucose-Capillary: 62 mg/dL — ABNORMAL LOW (ref 70–99)

## 2018-02-11 IMAGING — DX DG CHEST 2V
2 series · 2 of 2 positions shown · non-contrast
Comparison: 09/21/2015

CLINICAL DATA: Chest pain and shortness of breath, dialysis patient

EXAM:
CHEST  2 VIEW

[x chest ap]
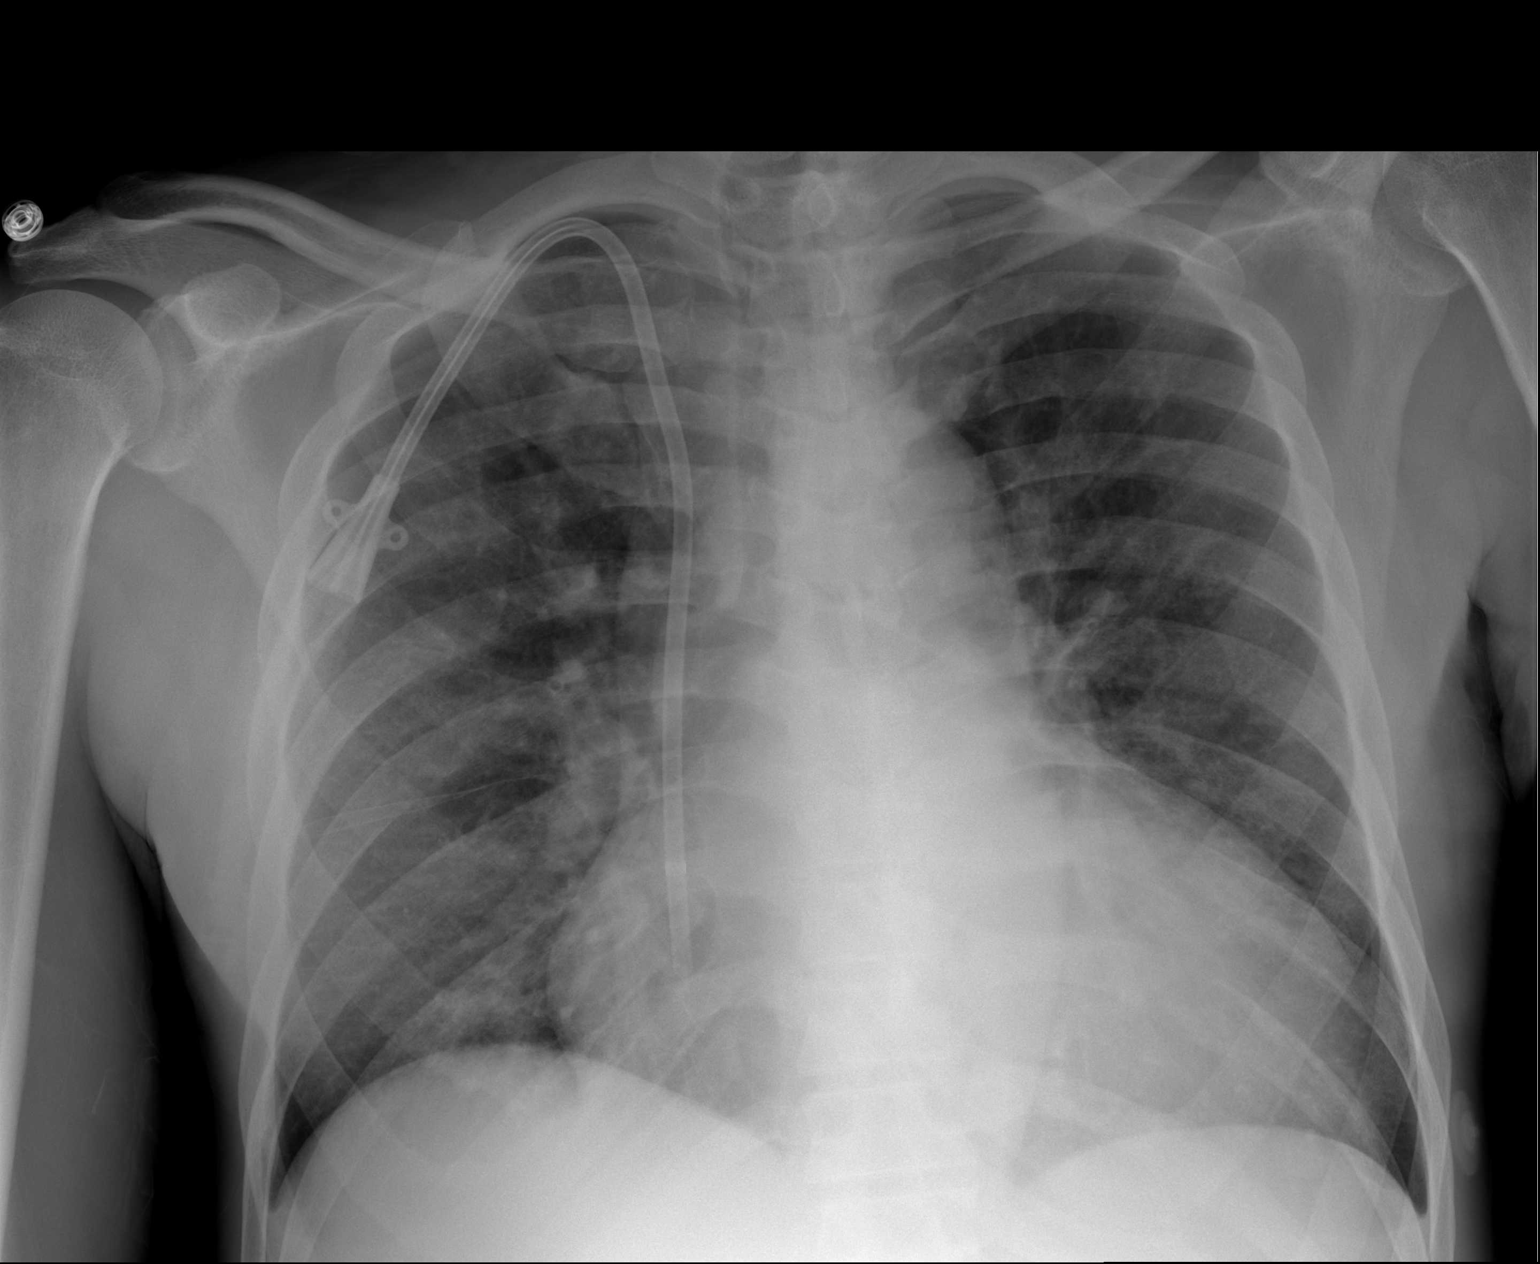

[w chest lat]
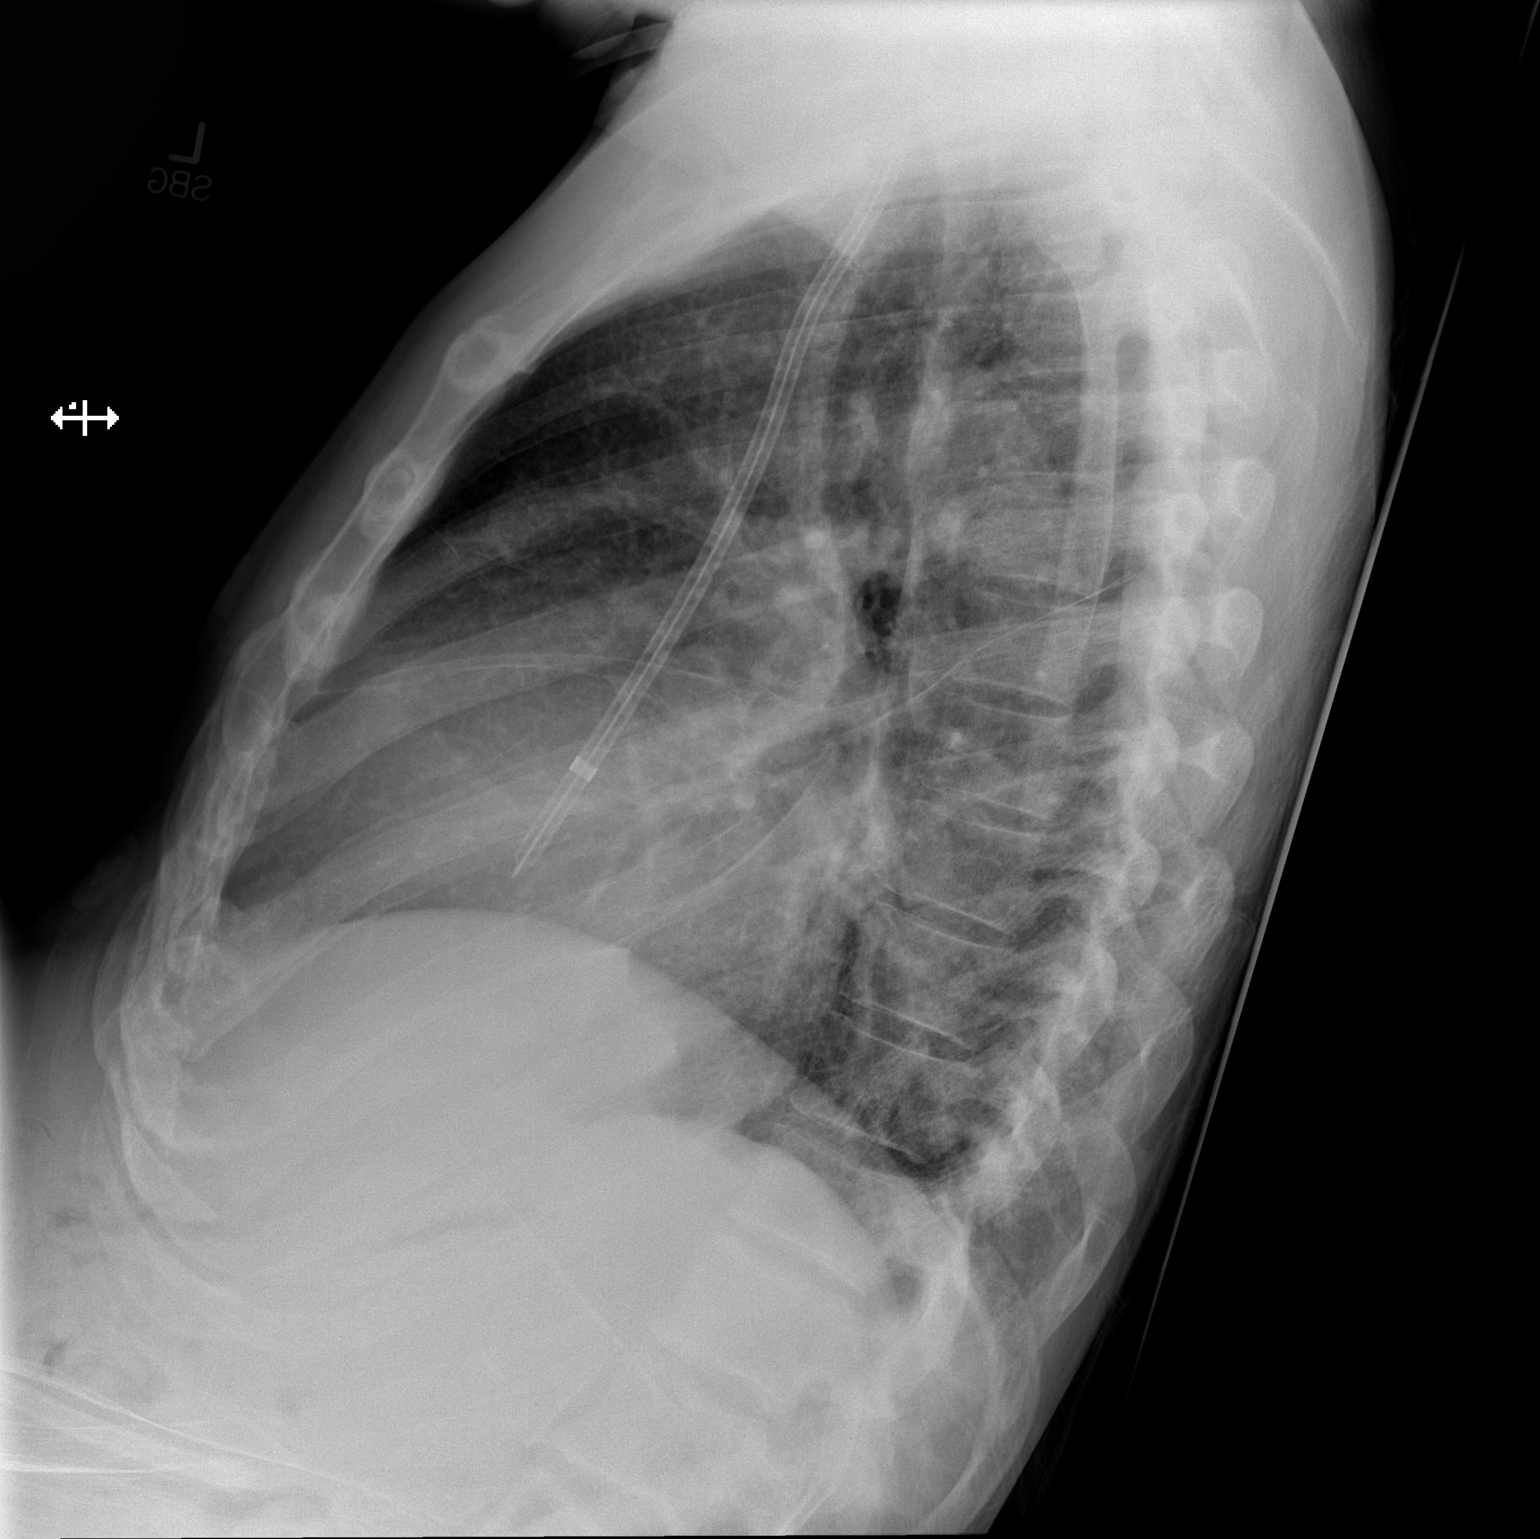

[2 of 2 positions shown; findings below may reference images not displayed]

FINDINGS: Dialysis catheter unchanged with tip into the right atrium. Severe
cardiac silhouette enlargement. Central vascular congestion similar
to prior study. No pulmonary edema or pleural effusion.
IMPRESSION: Cardiomegaly with central vascular congestion similar to prior
studies with no acute findings

## 2018-02-11 IMAGING — CR DG CHEST 1V PORT SAME DAY
1 series · 1 of 1 positions shown · non-contrast
Comparison: 09/23/2015

CLINICAL DATA: Encounter for central line placement

EXAM:
PORTABLE CHEST 1 VIEW

[AP]
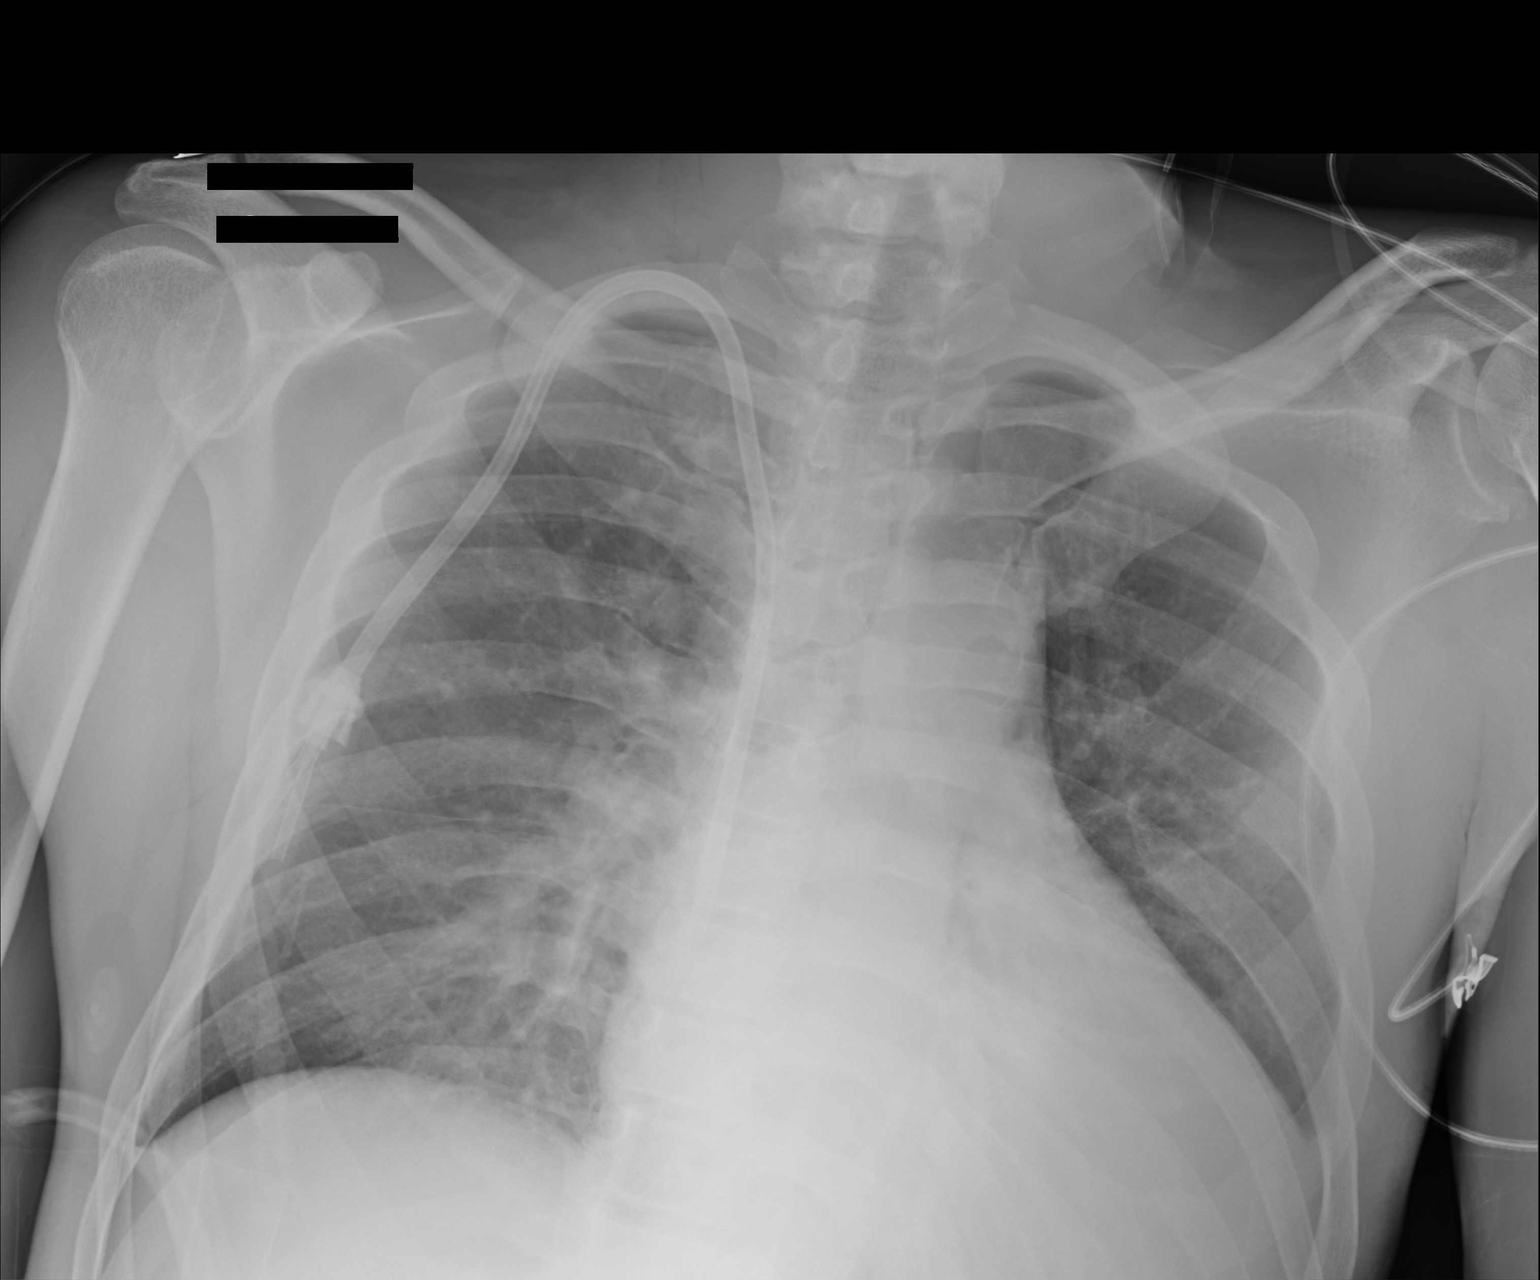

[1 of 1 positions shown; findings below may reference images not displayed]

FINDINGS: There is a right chest wall dialysis catheter with tips in the
cavoatrial junction. No pneumothorax identified. Cardiac enlargement
and pulmonary vascular congestion noted.
IMPRESSION: 1. No complication after right chest wall dialysis catheter
placement with tips in the cavoatrial junction.

## 2018-02-11 NOTE — ED Notes (Signed)
Pt given ice pack. Reports arm is too tender to ice.

## 2018-02-11 NOTE — ED Notes (Signed)
Pt calling 911 from room

## 2018-02-11 NOTE — ED Notes (Signed)
Pt continues to call out stating "my arm is cramping, I also need medicine for my anxiety"
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
## 2018-02-11 NOTE — ED Notes (Signed)
Pt CBG 57. Rn Tori notified.

## 2018-02-11 NOTE — ED Notes (Signed)
Pt given more juice for CBG 62.
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
## 2018-02-11 NOTE — ED Notes (Signed)
Pt states he is "discharging himself"

## 2018-02-11 NOTE — ED Notes (Signed)
Pt refuses to sign any DC paper and allow this RN to collect vitals.

## 2018-02-11 NOTE — ED Notes (Signed)
EDP aware CBG 57. Pt given juice, sandwich. Will continue to monitor.

## 2018-02-12 DIAGNOSIS — I517 Cardiomegaly: Secondary | ICD-10-CM | POA: Diagnosis not present

## 2018-02-12 DIAGNOSIS — I440 Atrioventricular block, first degree: Secondary | ICD-10-CM | POA: Diagnosis not present

## 2018-02-12 DIAGNOSIS — M5440 Lumbago with sciatica, unspecified side: Secondary | ICD-10-CM | POA: Diagnosis not present

## 2018-02-12 DIAGNOSIS — R9431 Abnormal electrocardiogram [ECG] [EKG]: Secondary | ICD-10-CM | POA: Diagnosis not present

## 2018-02-14 ENCOUNTER — Other Ambulatory Visit: Payer: Self-pay

## 2018-02-14 ENCOUNTER — Inpatient Hospital Stay (HOSPITAL_COMMUNITY)
Admission: EM | Admit: 2018-02-14 | Discharge: 2018-02-18 | DRG: 640 | Disposition: A | Discharge status: Home / Self Care | Payer: Medicare Other | Attending: Internal Medicine | Admitting: Internal Medicine

## 2018-02-14 ENCOUNTER — Emergency Department (HOSPITAL_COMMUNITY): Payer: Medicare Other

## 2018-02-14 ENCOUNTER — Encounter (HOSPITAL_COMMUNITY): Payer: Self-pay

## 2018-02-14 DIAGNOSIS — I82611 Acute embolism and thrombosis of superficial veins of right upper extremity: Secondary | ICD-10-CM

## 2018-02-14 DIAGNOSIS — I5042 Chronic combined systolic (congestive) and diastolic (congestive) heart failure: Secondary | ICD-10-CM | POA: Diagnosis present

## 2018-02-14 DIAGNOSIS — D72819 Decreased white blood cell count, unspecified: Secondary | ICD-10-CM | POA: Diagnosis present

## 2018-02-14 DIAGNOSIS — Z9111 Patient's noncompliance with dietary regimen: Secondary | ICD-10-CM

## 2018-02-14 DIAGNOSIS — Z8249 Family history of ischemic heart disease and other diseases of the circulatory system: Secondary | ICD-10-CM

## 2018-02-14 DIAGNOSIS — Z7189 Other specified counseling: Secondary | ICD-10-CM | POA: Diagnosis not present

## 2018-02-14 DIAGNOSIS — I5020 Unspecified systolic (congestive) heart failure: Secondary | ICD-10-CM | POA: Diagnosis not present

## 2018-02-14 DIAGNOSIS — Z886 Allergy status to analgesic agent status: Secondary | ICD-10-CM

## 2018-02-14 DIAGNOSIS — R05 Cough: Secondary | ICD-10-CM | POA: Diagnosis not present

## 2018-02-14 DIAGNOSIS — E1129 Type 2 diabetes mellitus with other diabetic kidney complication: Secondary | ICD-10-CM | POA: Diagnosis not present

## 2018-02-14 DIAGNOSIS — R112 Nausea with vomiting, unspecified: Secondary | ICD-10-CM | POA: Diagnosis present

## 2018-02-14 DIAGNOSIS — N186 End stage renal disease: Secondary | ICD-10-CM | POA: Diagnosis not present

## 2018-02-14 DIAGNOSIS — D631 Anemia in chronic kidney disease: Secondary | ICD-10-CM | POA: Diagnosis present

## 2018-02-14 DIAGNOSIS — Z87891 Personal history of nicotine dependence: Secondary | ICD-10-CM | POA: Diagnosis not present

## 2018-02-14 DIAGNOSIS — N185 Chronic kidney disease, stage 5: Secondary | ICD-10-CM

## 2018-02-14 DIAGNOSIS — R0602 Shortness of breath: Secondary | ICD-10-CM | POA: Diagnosis not present

## 2018-02-14 DIAGNOSIS — IMO0002 Reserved for concepts with insufficient information to code with codable children: Secondary | ICD-10-CM | POA: Diagnosis present

## 2018-02-14 DIAGNOSIS — I152 Hypertension secondary to endocrine disorders: Secondary | ICD-10-CM | POA: Diagnosis present

## 2018-02-14 DIAGNOSIS — E1122 Type 2 diabetes mellitus with diabetic chronic kidney disease: Secondary | ICD-10-CM | POA: Diagnosis present

## 2018-02-14 DIAGNOSIS — J90 Pleural effusion, not elsewhere classified: Secondary | ICD-10-CM | POA: Diagnosis not present

## 2018-02-14 DIAGNOSIS — N2889 Other specified disorders of kidney and ureter: Secondary | ICD-10-CM | POA: Diagnosis present

## 2018-02-14 DIAGNOSIS — Z515 Encounter for palliative care: Secondary | ICD-10-CM | POA: Diagnosis not present

## 2018-02-14 DIAGNOSIS — E8779 Other fluid overload: Secondary | ICD-10-CM | POA: Diagnosis present

## 2018-02-14 DIAGNOSIS — G4730 Sleep apnea, unspecified: Secondary | ICD-10-CM | POA: Diagnosis present

## 2018-02-14 DIAGNOSIS — F4323 Adjustment disorder with mixed anxiety and depressed mood: Secondary | ICD-10-CM | POA: Diagnosis present

## 2018-02-14 DIAGNOSIS — Z7901 Long term (current) use of anticoagulants: Secondary | ICD-10-CM

## 2018-02-14 DIAGNOSIS — Z9115 Patient's noncompliance with renal dialysis: Secondary | ICD-10-CM | POA: Diagnosis not present

## 2018-02-14 DIAGNOSIS — E8770 Fluid overload, unspecified: Secondary | ICD-10-CM | POA: Diagnosis not present

## 2018-02-14 DIAGNOSIS — Z992 Dependence on renal dialysis: Secondary | ICD-10-CM

## 2018-02-14 DIAGNOSIS — R1011 Right upper quadrant pain: Secondary | ICD-10-CM

## 2018-02-14 DIAGNOSIS — G4731 Primary central sleep apnea: Secondary | ICD-10-CM | POA: Diagnosis not present

## 2018-02-14 DIAGNOSIS — I132 Hypertensive heart and chronic kidney disease with heart failure and with stage 5 chronic kidney disease, or end stage renal disease: Secondary | ICD-10-CM | POA: Diagnosis present

## 2018-02-14 DIAGNOSIS — Z86711 Personal history of pulmonary embolism: Secondary | ICD-10-CM | POA: Diagnosis not present

## 2018-02-14 DIAGNOSIS — T8612 Kidney transplant failure: Secondary | ICD-10-CM | POA: Diagnosis present

## 2018-02-14 DIAGNOSIS — I2699 Other pulmonary embolism without acute cor pulmonale: Secondary | ICD-10-CM | POA: Diagnosis present

## 2018-02-14 DIAGNOSIS — E875 Hyperkalemia: Secondary | ICD-10-CM | POA: Diagnosis not present

## 2018-02-14 DIAGNOSIS — E1165 Type 2 diabetes mellitus with hyperglycemia: Secondary | ICD-10-CM | POA: Diagnosis not present

## 2018-02-14 DIAGNOSIS — Z79899 Other long term (current) drug therapy: Secondary | ICD-10-CM

## 2018-02-14 DIAGNOSIS — K861 Other chronic pancreatitis: Secondary | ICD-10-CM | POA: Diagnosis present

## 2018-02-14 DIAGNOSIS — R0902 Hypoxemia: Secondary | ICD-10-CM | POA: Diagnosis present

## 2018-02-14 DIAGNOSIS — J948 Other specified pleural conditions: Secondary | ICD-10-CM | POA: Diagnosis present

## 2018-02-14 DIAGNOSIS — Z86718 Personal history of other venous thrombosis and embolism: Secondary | ICD-10-CM | POA: Diagnosis present

## 2018-02-14 DIAGNOSIS — I10 Essential (primary) hypertension: Secondary | ICD-10-CM | POA: Diagnosis present

## 2018-02-14 DIAGNOSIS — E1159 Type 2 diabetes mellitus with other circulatory complications: Secondary | ICD-10-CM | POA: Diagnosis present

## 2018-02-14 DIAGNOSIS — M7989 Other specified soft tissue disorders: Secondary | ICD-10-CM | POA: Diagnosis not present

## 2018-02-14 DIAGNOSIS — K219 Gastro-esophageal reflux disease without esophagitis: Secondary | ICD-10-CM | POA: Diagnosis present

## 2018-02-14 DIAGNOSIS — Z9119 Patient's noncompliance with other medical treatment and regimen: Secondary | ICD-10-CM

## 2018-02-14 DIAGNOSIS — R197 Diarrhea, unspecified: Secondary | ICD-10-CM

## 2018-02-14 DIAGNOSIS — E871 Hypo-osmolality and hyponatremia: Secondary | ICD-10-CM | POA: Diagnosis present

## 2018-02-14 DIAGNOSIS — Z888 Allergy status to other drugs, medicaments and biological substances status: Secondary | ICD-10-CM

## 2018-02-14 DIAGNOSIS — I444 Left anterior fascicular block: Secondary | ICD-10-CM | POA: Diagnosis present

## 2018-02-14 DIAGNOSIS — Z885 Allergy status to narcotic agent status: Secondary | ICD-10-CM

## 2018-02-14 DIAGNOSIS — K828 Other specified diseases of gallbladder: Secondary | ICD-10-CM | POA: Diagnosis not present

## 2018-02-14 DIAGNOSIS — G4700 Insomnia, unspecified: Secondary | ICD-10-CM | POA: Diagnosis present

## 2018-02-14 DIAGNOSIS — K5901 Slow transit constipation: Secondary | ICD-10-CM | POA: Diagnosis present

## 2018-02-14 DIAGNOSIS — I429 Cardiomyopathy, unspecified: Secondary | ICD-10-CM | POA: Diagnosis present

## 2018-02-14 DIAGNOSIS — N189 Chronic kidney disease, unspecified: Secondary | ICD-10-CM

## 2018-02-14 DIAGNOSIS — I120 Hypertensive chronic kidney disease with stage 5 chronic kidney disease or end stage renal disease: Secondary | ICD-10-CM | POA: Diagnosis not present

## 2018-02-14 HISTORY — DX: Acute embolism and thrombosis of superficial veins of right upper extremity: I82.611

## 2018-02-14 LAB — CBC WITH DIFFERENTIAL/PLATELET
Abs Immature Granulocytes: 0 10*3/uL (ref 0.0–0.1)
BASOS ABS: 0 10*3/uL (ref 0.0–0.1)
BASOS PCT: 1 %
EOS ABS: 0.2 10*3/uL (ref 0.0–0.7)
EOS PCT: 6 %
HCT: 33.7 % — ABNORMAL LOW (ref 39.0–52.0)
Hemoglobin: 10.7 g/dL — ABNORMAL LOW (ref 13.0–17.0)
Immature Granulocytes: 0 %
Lymphocytes Relative: 15 %
Lymphs Abs: 0.5 10*3/uL — ABNORMAL LOW (ref 0.7–4.0)
MCH: 26.6 pg (ref 26.0–34.0)
MCHC: 31.8 g/dL (ref 30.0–36.0)
MCV: 83.6 fL (ref 78.0–100.0)
MONO ABS: 0.5 10*3/uL (ref 0.1–1.0)
Monocytes Relative: 14 %
Neutro Abs: 2.3 10*3/uL (ref 1.7–7.7)
Neutrophils Relative %: 64 %
Platelets: 166 10*3/uL (ref 150–400)
RBC: 4.03 MIL/uL — ABNORMAL LOW (ref 4.22–5.81)
RDW: 17.2 % — AB (ref 11.5–15.5)
WBC: 3.5 10*3/uL — ABNORMAL LOW (ref 4.0–10.5)

## 2018-02-14 LAB — COMPREHENSIVE METABOLIC PANEL
ALK PHOS: 109 U/L (ref 38–126)
ALT: 16 U/L (ref 0–44)
ANION GAP: 15 (ref 5–15)
AST: 37 U/L (ref 15–41)
Albumin: 2.8 g/dL — ABNORMAL LOW (ref 3.5–5.0)
BUN: 63 mg/dL — ABNORMAL HIGH (ref 6–20)
CALCIUM: 8.7 mg/dL — AB (ref 8.9–10.3)
CO2: 22 mmol/L (ref 22–32)
Chloride: 98 mmol/L (ref 98–111)
Creatinine, Ser: 13.51 mg/dL — ABNORMAL HIGH (ref 0.61–1.24)
GFR calc non Af Amer: 4 mL/min — ABNORMAL LOW (ref >60)
GFR, EST AFRICAN AMERICAN: 4 mL/min — AB (ref >60)
Glucose, Bld: 96 mg/dL (ref 70–99)
Potassium: 4.9 mmol/L (ref 3.5–5.1)
SODIUM: 135 mmol/L (ref 135–145)
TOTAL PROTEIN: 7.8 g/dL (ref 6.5–8.1)
Total Bilirubin: 1 mg/dL (ref 0.3–1.2)

## 2018-02-14 LAB — I-STAT TROPONIN, ED: TROPONIN I, POC: 0.06 ng/mL (ref 0.00–0.08)

## 2018-02-14 LAB — LIPASE, BLOOD: Lipase: 46 U/L (ref 11–51)

## 2018-02-14 LAB — MRSA PCR SCREENING: MRSA by PCR: NEGATIVE

## 2018-02-14 MED ORDER — ONDANSETRON HCL 4 MG/2ML IJ SOLN: 4.0000 mg | Freq: Once | INTRAMUSCULAR | Status: DC

## 2018-02-14 MED ORDER — PANTOPRAZOLE SODIUM 40 MG PO TBEC
40.0000 mg | DELAYED_RELEASE_TABLET | Freq: Every day | ORAL | Status: DC
  Administered 2018-02-15 – 2018-02-17 (×3): 40 mg via ORAL
  Filled 2018-02-14 (×4): qty 1

## 2018-02-14 MED ORDER — HYDRALAZINE HCL 20 MG/ML IJ SOLN
10.0000 mg | INTRAMUSCULAR | Status: DC | PRN
  Administered 2018-02-15: 20 mg via INTRAVENOUS

## 2018-02-14 MED ORDER — APIXABAN 5 MG PO TABS
5.0000 mg | ORAL_TABLET | Freq: Two times a day (BID) | ORAL | Status: DC
  Administered 2018-02-14 – 2018-02-17 (×7): 5 mg via ORAL
  Filled 2018-02-14 (×8): qty 1

## 2018-02-14 MED ORDER — SODIUM CHLORIDE 0.9% FLUSH
3.0000 mL | Freq: Two times a day (BID) | INTRAVENOUS | Status: DC
  Administered 2018-02-14 – 2018-02-16 (×3): 3 mL via INTRAVENOUS
  Administered 2018-02-17: 10 mL via INTRAVENOUS
  Administered 2018-02-17: 3 mL via INTRAVENOUS

## 2018-02-14 MED ORDER — PENTAFLUOROPROP-TETRAFLUOROETH EX AERO
1.0000 | INHALATION_SPRAY | CUTANEOUS | Status: DC | PRN
Start: 2018-02-14 — End: 2018-02-17

## 2018-02-14 MED ORDER — CHLORHEXIDINE GLUCONATE CLOTH 2 % EX PADS: 6.0000 | MEDICATED_PAD | Freq: Every day | CUTANEOUS | Status: DC

## 2018-02-14 MED ORDER — KETOROLAC TROMETHAMINE 30 MG/ML IJ SOLN
30.0000 mg | Freq: Four times a day (QID) | INTRAMUSCULAR | Status: DC | PRN
  Administered 2018-02-14 – 2018-02-17 (×4): 30 mg via INTRAVENOUS
  Filled 2018-02-14 (×5): qty 1

## 2018-02-14 MED ORDER — CINACALCET HCL 30 MG PO TABS
90.0000 mg | ORAL_TABLET | Freq: Every day | ORAL | Status: DC
Start: 2018-02-14 — End: 2018-02-18
  Administered 2018-02-14 – 2018-02-17 (×4): 90 mg via ORAL
  Filled 2018-02-14 (×6): qty 3

## 2018-02-14 MED ORDER — SEVELAMER CARBONATE 800 MG PO TABS
2400.0000 mg | ORAL_TABLET | Freq: Three times a day (TID) | ORAL | Status: DC
  Filled 2018-02-14 (×5): qty 3

## 2018-02-14 MED ORDER — SODIUM CHLORIDE 0.9 % IV SOLN: 100.0000 mL | INTRAVENOUS | Status: DC | PRN

## 2018-02-14 MED ORDER — ONDANSETRON HCL 4 MG PO TABS: 4.0000 mg | ORAL_TABLET | Freq: Every day | ORAL | Status: DC | PRN

## 2018-02-14 MED ORDER — HYDROMORPHONE HCL 1 MG/ML IJ SOLN
1.0000 mg | Freq: Once | INTRAMUSCULAR | Status: AC
  Administered 2018-02-14: 1 mg via INTRAVENOUS
  Filled 2018-02-14: qty 1

## 2018-02-14 MED ORDER — LIDOCAINE-PRILOCAINE 2.5-2.5 % EX CREA: 1.0000 "application " | TOPICAL_CREAM | CUTANEOUS | Status: DC | PRN

## 2018-02-14 MED ORDER — ZOLPIDEM TARTRATE 5 MG PO TABS
5.0000 mg | ORAL_TABLET | Freq: Every evening | ORAL | Status: DC | PRN
  Administered 2018-02-14 – 2018-02-17 (×3): 5 mg via ORAL
  Filled 2018-02-14 (×3): qty 1

## 2018-02-14 MED ORDER — POLYETHYLENE GLYCOL 3350 17 G PO PACK
17.0000 g | PACK | Freq: Every day | ORAL | Status: DC
  Filled 2018-02-14 (×2): qty 1

## 2018-02-14 MED ORDER — ONDANSETRON HCL 4 MG PO TABS: 4.0000 mg | ORAL_TABLET | Freq: Once | ORAL | Status: DC

## 2018-02-14 MED ORDER — OXYCODONE HCL 5 MG PO TABS: 5.0000 mg | ORAL_TABLET | ORAL | Status: DC | PRN

## 2018-02-14 MED ORDER — SODIUM CHLORIDE 0.9% FLUSH: 3.0000 mL | INTRAVENOUS | Status: DC | PRN

## 2018-02-14 MED ORDER — SODIUM CHLORIDE 0.9 % IV SOLN: 250.0000 mL | INTRAVENOUS | Status: DC | PRN

## 2018-02-14 MED ORDER — LISINOPRIL 5 MG PO TABS
5.0000 mg | ORAL_TABLET | Freq: Every day | ORAL | Status: DC
  Administered 2018-02-15 – 2018-02-17 (×3): 5 mg via ORAL
  Filled 2018-02-14 (×4): qty 2

## 2018-02-14 MED ORDER — DARBEPOETIN ALFA 60 MCG/0.3ML IJ SOSY
60.0000 ug | PREFILLED_SYRINGE | INTRAMUSCULAR | Status: DC
  Filled 2018-02-14: qty 0.3

## 2018-02-14 MED ORDER — ONDANSETRON HCL 4 MG/2ML IJ SOLN
4.0000 mg | Freq: Once | INTRAMUSCULAR | Status: AC
  Administered 2018-02-14: 4 mg via INTRAVENOUS
  Filled 2018-02-14: qty 2

## 2018-02-14 MED ORDER — AMMONIUM LACTATE 12 % EX LOTN
TOPICAL_LOTION | CUTANEOUS | Status: DC | PRN
  Filled 2018-02-14: qty 225

## 2018-02-14 MED ORDER — DOCUSATE SODIUM 100 MG PO CAPS
100.0000 mg | ORAL_CAPSULE | Freq: Two times a day (BID) | ORAL | Status: DC
  Administered 2018-02-17: 100 mg via ORAL
  Filled 2018-02-14 (×5): qty 1

## 2018-02-14 MED ORDER — DICLOFENAC SODIUM 1 % TD GEL
2.0000 g | Freq: Four times a day (QID) | TRANSDERMAL | Status: DC | PRN
  Filled 2018-02-14: qty 100

## 2018-02-14 MED ORDER — ONDANSETRON 4 MG PO TBDP
8.0000 mg | ORAL_TABLET | Freq: Three times a day (TID) | ORAL | Status: DC | PRN
  Administered 2018-02-14: 8 mg via ORAL
  Filled 2018-02-14: qty 2

## 2018-02-14 MED ORDER — LORAZEPAM 1 MG PO TABS
1.0000 mg | ORAL_TABLET | Freq: Once | ORAL | Status: AC
  Administered 2018-02-14: 1 mg via ORAL
  Filled 2018-02-14: qty 1

## 2018-02-14 MED ORDER — OXYCODONE HCL 5 MG PO TABS: 5.0000 mg | ORAL_TABLET | Freq: Once | ORAL | Status: DC

## 2018-02-14 MED ORDER — ALPRAZOLAM 0.25 MG PO TABS
0.5000 mg | ORAL_TABLET | Freq: Three times a day (TID) | ORAL | Status: DC | PRN
  Administered 2018-02-15 – 2018-02-18 (×8): 0.5 mg via ORAL
  Filled 2018-02-14 (×9): qty 2

## 2018-02-14 MED ORDER — HYDROMORPHONE HCL 2 MG PO TABS
1.0000 mg | ORAL_TABLET | ORAL | Status: DC | PRN
  Administered 2018-02-14 – 2018-02-17 (×4): 1 mg via ORAL
  Filled 2018-02-14 (×8): qty 1
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
## 2018-02-14 NOTE — ED Notes (Signed)
Pt refusing rectal temp sates " You're not putting anything down there"

## 2018-02-14 NOTE — ED Notes (Signed)
Verbal order placed for 87m Ativan PO by Dr SJonnie Finner

## 2018-02-14 NOTE — ED Triage Notes (Signed)
Pt came in d/t c/o N/V RLQ abd pain that being going on for x2 days.
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
## 2018-02-14 NOTE — ED Notes (Signed)
EDP notified of hypertension.
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
## 2018-02-14 NOTE — ED Notes (Signed)
Pt reports a decrease in pain at this time

## 2018-02-14 NOTE — ED Notes (Signed)
Pt refusing to let staff attach monitor equipment. Pt resting at this time.

## 2018-02-14 NOTE — ED Notes (Signed)
Patient transported to CT 

## 2018-02-14 NOTE — ED Notes (Signed)
Unable to get oral temp. Axillary temp low. Tried to check rectal with Nurse. Pt keeps tensing up, yelling and flipped over. Unable to get rectal.

## 2018-02-14 NOTE — ED Notes (Signed)
Nephrology Provider at bedside.

## 2018-02-14 NOTE — Progress Notes (Signed)
New Admission Note:   Arrival Method: stretcher Mental Orientation: alert and oriented Telemetry: n/a Assessment: completed Skin: refused assessment IV: R external jugular saline locked Pain: 9/10 right upper abdominal Tubes: n/a Safety Measures: reviewed safety measures. Pt with bed in high position. Pt continues with bed in high position despite education for safety. Will continue to monitor.  Admission: Not complete 37M Orientation: complete Family: No one at bedside Personal Belongings: clothing  Orders have been reviewed and implemented. Will continue to monitor the patient. Call light has been placed within reach and bed alarm has been activated.   Manya Silvas, RN MSN [place] Phone number: [phone]

## 2018-02-14 NOTE — Progress Notes (Signed)
   02/14/18 1900  Clinical Encounter Type  Visited With Patient  Visit Type Initial  Referral From Nurse  Consult/Referral To Chaplain  Spiritual Encounters  Spiritual Needs Emotional  Stress Factors  Patient Stress Factors Exhausted    Pt was in lots of pain when I arrive. Pt asked to have food and be visited later. Chaplain talked to pt's nurse and promised to revisit later when pt is ready.  Sharman Garrott a Medical sales representative, [employer]

## 2018-02-14 NOTE — ED Notes (Signed)
Lunch tray delivered.

## 2018-02-14 NOTE — Progress Notes (Signed)
VASCULAR LAB PRELIMINARY  PRELIMINARY  PRELIMINARY  PRELIMINARY  Right upper extremity venous duplex completed.    Preliminary report:  There is no DVT noted in the right upper extremity.  There is superficial thrombosis noted in the right cephalic vein.  Called results to Dr. Emelda Fear, [facility], RVT 02/14/2018, 10:11 AM

## 2018-02-14 NOTE — ED Notes (Signed)
Assisted pt to stand for ambulation but he appeared very weak. I asked if he needed to sit back down, he stated "Don't discourage me, let's just do it". He walked out of room with pulse ox reading of 94, dropping to 85. He did not appear to have labored breathing but only walked about 10' and had to go back to his bed. He requested being left sitting up on the side with his head on a pillow , on top of the bedside tray.

## 2018-02-14 NOTE — ED Notes (Signed)
Pt fatigued after walking. Pt given apple juice, asking for food tray. Pt informed tray was ordered and will arrive soon. Offered pt recliner for comfort, pt declined at this time.

## 2018-02-15 ENCOUNTER — Inpatient Hospital Stay (HOSPITAL_COMMUNITY): Payer: Medicare Other

## 2018-02-15 DIAGNOSIS — I5020 Unspecified systolic (congestive) heart failure: Secondary | ICD-10-CM

## 2018-02-15 DIAGNOSIS — N185 Chronic kidney disease, stage 5: Secondary | ICD-10-CM

## 2018-02-15 DIAGNOSIS — D631 Anemia in chronic kidney disease: Secondary | ICD-10-CM

## 2018-02-15 DIAGNOSIS — E1165 Type 2 diabetes mellitus with hyperglycemia: Secondary | ICD-10-CM

## 2018-02-15 DIAGNOSIS — K5901 Slow transit constipation: Secondary | ICD-10-CM

## 2018-02-15 DIAGNOSIS — I132 Hypertensive heart and chronic kidney disease with heart failure and with stage 5 chronic kidney disease, or end stage renal disease: Secondary | ICD-10-CM

## 2018-02-15 DIAGNOSIS — F4323 Adjustment disorder with mixed anxiety and depressed mood: Secondary | ICD-10-CM

## 2018-02-15 DIAGNOSIS — E1129 Type 2 diabetes mellitus with other diabetic kidney complication: Secondary | ICD-10-CM

## 2018-02-15 DIAGNOSIS — R1011 Right upper quadrant pain: Secondary | ICD-10-CM

## 2018-02-15 DIAGNOSIS — G4731 Primary central sleep apnea: Secondary | ICD-10-CM

## 2018-02-15 LAB — RENAL FUNCTION PANEL
ANION GAP: 16 — AB (ref 5–15)
Albumin: 2.8 g/dL — ABNORMAL LOW (ref 3.5–5.0)
BUN: 71 mg/dL — ABNORMAL HIGH (ref 6–20)
CO2: 20 mmol/L — AB (ref 22–32)
Calcium: 8.7 mg/dL — ABNORMAL LOW (ref 8.9–10.3)
Chloride: 97 mmol/L — ABNORMAL LOW (ref 98–111)
Creatinine, Ser: 14.12 mg/dL — ABNORMAL HIGH (ref 0.61–1.24)
GFR calc non Af Amer: 3 mL/min — ABNORMAL LOW (ref >60)
GFR, EST AFRICAN AMERICAN: 4 mL/min — AB (ref >60)
Glucose, Bld: 79 mg/dL (ref 70–99)
PHOSPHORUS: 9.7 mg/dL — AB (ref 2.5–4.6)
POTASSIUM: 5.4 mmol/L — AB (ref 3.5–5.1)
SODIUM: 133 mmol/L — AB (ref 135–145)

## 2018-02-15 LAB — CBC
HEMATOCRIT: 33.2 % — AB (ref 39.0–52.0)
HEMOGLOBIN: 10.9 g/dL — AB (ref 13.0–17.0)
MCH: 26.8 pg (ref 26.0–34.0)
MCHC: 32.8 g/dL (ref 30.0–36.0)
MCV: 81.6 fL (ref 78.0–100.0)
Platelets: 177 10*3/uL (ref 150–400)
RBC: 4.07 MIL/uL — AB (ref 4.22–5.81)
RDW: 16.8 % — ABNORMAL HIGH (ref 11.5–15.5)
WBC: 3.8 10*3/uL — ABNORMAL LOW (ref 4.0–10.5)

## 2018-02-15 MED ORDER — HYDROMORPHONE HCL 1 MG/ML IJ SOLN
0.5000 mg | INTRAMUSCULAR | Status: DC | PRN
  Administered 2018-02-15 – 2018-02-18 (×15): 0.5 mg via INTRAVENOUS
  Filled 2018-02-15 (×14): qty 1

## 2018-02-15 MED ORDER — HYDROMORPHONE HCL 1 MG/ML IJ SOLN
1.0000 mg | Freq: Once | INTRAMUSCULAR | Status: AC
  Administered 2018-02-15: 1 mg via INTRAVENOUS

## 2018-02-15 MED ORDER — TECHNETIUM TC 99M MEBROFENIN IV KIT
5.3200 | PACK | Freq: Once | INTRAVENOUS | Status: AC | PRN
Start: 2018-02-15 — End: 2018-02-15
  Administered 2018-02-15: 5.32 via INTRAVENOUS

## 2018-02-15 MED ORDER — ONDANSETRON HCL 4 MG/2ML IJ SOLN
INTRAMUSCULAR | Status: AC
  Administered 2018-02-15: 4 mg
  Filled 2018-02-15: qty 2

## 2018-02-15 MED ORDER — HYDRALAZINE HCL 20 MG/ML IJ SOLN
INTRAMUSCULAR | Status: AC
  Administered 2018-02-15: 20 mg via INTRAVENOUS
  Filled 2018-02-15: qty 1

## 2018-02-15 MED ORDER — HYDROMORPHONE HCL 1 MG/ML IJ SOLN
INTRAMUSCULAR | Status: AC
  Administered 2018-02-15: 1 mg via INTRAVENOUS
  Filled 2018-02-15: qty 1

## 2018-02-15 MED ORDER — ONDANSETRON HCL 4 MG/2ML IJ SOLN: 4.0000 mg | Freq: Once | INTRAMUSCULAR | Status: DC

## 2018-02-15 MED ORDER — HYDRALAZINE HCL 20 MG/ML IJ SOLN: 10.0000 mg | Freq: Four times a day (QID) | INTRAMUSCULAR | Status: DC | PRN

## 2018-02-15 MED ORDER — DOXERCALCIFEROL 4 MCG/2ML IV SOLN
7.0000 ug | INTRAVENOUS | Status: DC
  Administered 2018-02-16: 7 ug via INTRAVENOUS
  Filled 2018-02-15 (×2): qty 4

## 2018-02-15 NOTE — Progress Notes (Signed)
HD tx completed @ 0410 w/ increased bp throughout tx, only came down shortly after prn bp/pain/and anti nausea meds admin and pt was asleep, UF goal met, blood rinsed back, VSS w/ high bp, report called to Carolyne Littles, RN

## 2018-02-15 NOTE — Progress Notes (Signed)
PROGRESS NOTE  EDGE MAUGER QMV:[id number] DOB: 05-Aug-1965 DOA: 02/14/2018 PCP: Patient, [name]  HPI/Recap of past 24 hours:  This is the 13th hospital admission for the 52 yo male Mr.Tag D Nedeau, since January 2019.  Eight of those admissions have been to our facility; 5 other admissions documented in care everywhere in the epic system.  Who presents to our emergency department with complaints of nausea vomiting and right lower quadrant abdominal pain for 2 days and shortness of breath with right arm swelling..  Medical history significant for hypertension, diet-controlled diabetes requiring no insulin at recent admission, gastroesophageal reflux disease, abdominal pain likely IBS or pain from mass in left kidney, depression, anxiety, DVT with history of pulmonary embolism on Eliquis, congestive heart failure with an ejection fraction of 35%, end-stage renal disease on hemodialysis Monday Wednesday Friday with a chronically elevated troponin and a recent episode of pancreatitis with pseudocyst (treated here and at [facility] in the spring 2019) and chronic right-sided pleural effusion thought secondary to nonadherence to hemodialysis with total body volume overload resulting in right sided pleural effusion.  CT scan 2 weeks ago showed chronic hepatic congestion.  Admitted for right upper quadrant abdominal pain.   02/15/2018: Patient examined at his bedside.  He reports persistent right upper quadrant abdominal pain.  HIDA scan unremarkable.     Assessment/Plan: Principal Problem:   Right upper quadrant abdominal pain Active Problems:   Anemia of chronic kidney failure   Nausea & vomiting   DM (diabetes mellitus), type 2, uncontrolled, with renal complications (HCC)   Essential hypertension   Adjustment disorder with mixed anxiety and depressed mood   Left renal mass   Constipation by delayed colonic transit   GERD (gastroesophageal reflux disease)   Complex sleep apnea syndrome   DVT (deep  venous thrombosis) (HCC)   End stage renal disease on dialysis (HCC)   Benign hypertensive heart and kidney disease with systolic CHF, NYHA class 3 and CKD stage 5 (HCC)   Pleural effusion on right   PE (pulmonary thromboembolism) (HCC)   Hypoxemia   Pleural effusion, right   Superficial venous thrombosis of arm, right  Intractable right upper quadrant abdominal pain of unclear etiology HIDA scan unremarkable Continue to treat symptomatically Avoid morphine and tramadol Pain management and bowel regimen in place  Intractable nausea and vomiting Unclear etiology possibly secondary to uremia Had hemodialysis today 02/15/2018  Accelerated hypertension Continue home medications IV hydralazine as needed  Hyperkalemia in the setting of end-stage renal disease on dialysis Post hemodialysis today Repeat BMP in the morning Last potassium 5.4 early this morning  Superficial venous thrombosis affecting the right cephalic vein Noted  Hypervolemic hyponatremia Sodium 133 prior to dialysis on 02/15/2018 Repeat BMP tomorrow  Anemia of chronic disease Most likely secondary to end-stage renal disease  Hemoglobin stable No sign of overt bleeding  History of DVT on Eliquis  Fluid overload in the setting of end-stage renal disease suspect secondary to noncompliance with diet and hemodialysis     Code Status: Full code  Family Communication: None at bedside  Disposition Plan: Home in 1 to 2 days when nephrology signs of   Consultants:  Nephrology  Palliative care  Procedures:  HD on 02/15/2018  Antimicrobials:  None  DVT prophylaxis:  eliquis   Objective: Vitals:   02/15/18 0400 02/15/18 0410 02/15/18 0436 02/15/18 1104  BP: (!) 160/104 (!) 152/109 (!) 153/106 (!) 169/125  Pulse: 86 90 87 87  Resp: 11 11  11 18  Temp:   (!) 97.2 F (36.2 C)   TempSrc:   Oral   SpO2: 96% 97% 94% 100%  Weight:      Height:        Intake/Output Summary (Last 24 hours) at  02/15/2018 1433 Last data filed at 02/15/2018 0900 Gross per 24 hour  Intake 243 ml  Output 5006 ml  Net -4763 ml   Filed Weights   02/14/18 0655 02/14/18 1612  Weight: 74.8 kg (165 lb) 79.5 kg (175 lb 4.3 oz)    Exam:  . General: 52 y.o. year-old male well developed well nourished in no acute distress.  Alert and oriented x3. . Cardiovascular: Regular rate and rhythm with no rubs or gallops.  No thyromegaly or JVD noted.   [name] Respiratory: Diffuse rales bilaterally with no wheezes. Good inspiratory effort. . Abdomen: Soft tenderness in the right upper quadrant abdomen nondistended with normal bowel sounds x4 quadrants. . Musculoskeletal: No lower extremity edema. 2/4 pulses in all 4 extremities. [name] Psychiatry: Mood is anxious   Data Reviewed: CBC: Recent Labs  Lab 02/09/18 1049  02/10/18 1428 02/10/18 1504 02/10/18 1840 02/14/18 0841 02/15/18 0100  WBC 3.9*  --  3.4*  --   --  3.5* 3.8*  NEUTROABS  --   --  2.2  --   --  2.3  --   HGB 11.6*   < > 11.6* 13.6 16.0 10.7* 10.9*  HCT 36.9*   < > 37.6* 40.0 47.0 33.7* 33.2*  MCV 85.6  --  85.6  --   --  83.6 81.6  PLT 141*  --  149*  --   --  166 177   < > = values in this interval not displayed.   Basic Metabolic Panel: Recent Labs  Lab 02/09/18 1049  02/10/18 1428 02/10/18 1504 02/10/18 1840 02/14/18 0841 02/15/18 0100  NA 137   < > 138 136 139 135 133*  K 4.2   < > 5.6* 5.4* 6.2* 4.9 5.4*  CL 98   < > 99 100 103 98 97*  CO2 25  --  24  --   --  22 20*  GLUCOSE 73   < > 102* 99 57* 96 79  BUN 30*   < > 43* 43* 59* 63* 71*  CREATININE 9.65*   < > 11.25* 11.60* 12.40* 13.51* 14.12*  CALCIUM 8.2*  --  8.5*  --   --  8.7* 8.7*  MG 2.1  --   --   --   --   --   --   PHOS  --   --   --   --   --   --  9.7*   < > = values in this interval not displayed.   GFR: Estimated Creatinine Clearance: 6.8 mL/min (A) (by C-G formula based on SCr of 14.12 mg/dL (H)). Liver Function Tests: Recent Labs  Lab 02/14/18 0841  02/15/18 0100  AST 37  --   ALT 16  --   ALKPHOS 109  --   BILITOT 1.0  --   PROT 7.8  --   ALBUMIN 2.8* 2.8*   Recent Labs  Lab 02/14/18 0841  LIPASE 46   No results for input(s): AMMONIA in the last 168 hours. Coagulation Profile: No results for input(s): INR, PROTIME in the last 168 hours. Cardiac Enzymes: Recent Labs  Lab 02/09/18 1049 02/09/18 1657  TROPONINI 0.03* 0.03*   BNP (last 3 results) No  results for input(s): PROBNP in the last 8760 hours. HbA1C: No results for input(s): HGBA1C in the last 72 hours. CBG: Recent Labs  Lab 02/11/18 0051 02/11/18 0223  GLUCAP 57* 62*   Lipid Profile: No results for input(s): CHOL, HDL, LDLCALC, TRIG, CHOLHDL, LDLDIRECT in the last 72 hours. Thyroid Function Tests: No results for input(s): TSH, T4TOTAL, FREET4, T3FREE, THYROIDAB in the last 72 hours. Anemia Panel: No results for input(s): VITAMINB12, FOLATE, FERRITIN, TIBC, IRON, RETICCTPCT in the last 72 hours. Urine analysis:    Component Value Date/Time   COLORURINE YELLOW 10/18/2013 0419   APPEARANCEUR CLEAR 10/18/2013 0419   LABSPEC 1.008 10/18/2013 0419   PHURINE 8.5 (H) 10/18/2013 0419   GLUCOSEU 100 (A) 10/18/2013 0419   HGBUR TRACE (A) 10/18/2013 0419   BILIRUBINUR NEGATIVE 10/18/2013 0419   KETONESUR NEGATIVE 10/18/2013 0419   PROTEINUR 100 (A) 10/18/2013 0419   UROBILINOGEN 0.2 10/18/2013 0419   NITRITE NEGATIVE 10/18/2013 0419   LEUKOCYTESUR NEGATIVE 10/18/2013 0419   Sepsis Labs: _0 (procalcitonin:4,lacticidven:4)  ) Recent Results (from the past 240 hour(s))  MRSA PCR Screening     Status: None   Collection Time: 02/14/18  6:34 PM  Result Value Ref Range Status   MRSA by PCR NEGATIVE NEGATIVE Final    Comment:        The GeneXpert MRSA Assay (FDA approved for NASAL specimens only), is one component of a comprehensive MRSA colonization surveillance program. It is not intended to diagnose MRSA infection nor to guide or monitor  treatment for MRSA infections. Performed at [facility], [place] [address], [place], [place] [address]       Studies: Nm Hepatobiliary Liver Func  Result Date: 02/15/2018 CLINICAL DATA:  Inpatient.  Right upper quadrant abdominal pain. EXAM: NUCLEAR MEDICINE HEPATOBILIARY IMAGING TECHNIQUE: Sequential images of the abdomen were obtained out to 60 minutes following intravenous administration of radiopharmaceutical. RADIOPHARMACEUTICALS:  5.3 mCi Tc-41m Choletec IV COMPARISON:  02/04/2018 CT abdomen/pelvis. 02/14/2018 abdominal sonogram. FINDINGS: Prompt uptake and biliary excretion of activity by the liver is seen. Gallbladder activity is visualized, consistent with patency of cystic duct. Biliary activity passes into small bowel, consistent with patent common bile duct. IMPRESSION: Normal hepatobiliary scintigraphy study. Patent cystic duct, which is not compatible with acute cholecystitis. Patent common bile duct. Electronically Signed   By: JIlona SorrelM.D.   On: 02/15/2018 10:24    Scheduled Meds: . apixaban  5 mg Oral BID  . Chlorhexidine Gluconate Cloth  6 each Topical Q0600  . cinacalcet  90 mg Oral Q supper  . [START ON 02/18/2018] Darbepoetin Alfa  60 mcg Intravenous Q Wed-HD  . docusate sodium  100 mg Oral BID  . [START ON 02/16/2018] doxercalciferol  7 mcg Intravenous Q M,W,F-HD  . lisinopril  5 mg Oral Daily  . ondansetron (ZOFRAN) IV  4 mg Intravenous Once  . pantoprazole  40 mg Oral Daily  . polyethylene glycol  17 g Oral Daily  . sevelamer carbonate  2,400 mg Oral TID WC  . sodium chloride flush  3 mL Intravenous Q12H    Continuous Infusions: . sodium chloride    . sodium chloride       LOS: 1 day     CKayleen Memos MD [facility] Pager [phone] If 7PM-7AM, please contact night-coverage [email or web address] Password [email or web address]7/28/2019, 2:33 PM

## 2018-02-15 NOTE — Progress Notes (Signed)
HD tx initiated via 15Gx2 w/o problem, pull/push/flush well w/o problem, VSS but w/ very high bp, will cont to monitor while on HD tx

## 2018-02-15 NOTE — Progress Notes (Signed)
Transport here to pick up for hemo. Pa ient stated he is to have procedure to remove fluid from lungs PRIOR to hemo. No order found. Stated he is sob and needs oxygen. Oxygen sat is 100% on RA. On call texted.

## 2018-02-15 NOTE — Progress Notes (Signed)
PT Cancellation Note  Patient Details Name: Frank [name] MRN: [id number] DOB: 1965-12-21   Cancelled Treatment:    Reason Eval/Treat Not Completed: Patient declined, no reason specified. Will continue attempts for PT evaluation.   Wynetta Fines, SPT  Office #[phone] Wynetta Fines 02/15/2018, 2:11 PM

## 2018-02-15 NOTE — Progress Notes (Addendum)
[facility] Progress Note   Subjective:  HD last night 5L off, breathing better  Went for HIDA scan this am Concerned about RUA swelling   Objective Vitals:   02/15/18 0330 02/15/18 0400 02/15/18 0410 02/15/18 0436  BP: (!) 140/92 (!) 160/104 (!) 152/109 (!) 153/106  Pulse: 85 86 90 87  Resp: (!) _0 Temp:    (!) 97.2 F (36.2 C)  TempSrc:    Oral  SpO2: 96% 96% 97% 94%  Weight:      Height:       Physical Exam General: Ill appearing male NAD Heart: RRR  Lungs: CTAB Abdomen: diffuse tenderness to palpation Extremities: Trace LE edema  Dialysis Access: LUE AVF +bruit   Home meds:  - lisinopril 5 qd  - eliquis 5 bid  - sensipar/ renvela tid ac/ fosrenol ac  - percocet prn/ zofran other prns/ ppi   Dialysis Orders:  MWF   4h   72kg   2/2.25 bath  Hep none  L AVF  - hectorol 7 ug tiw  - mircera 50 every 4 wks  - venofer 50 / wk   Assessment/Plan: 1. Abd pain N/V -  RUQ pain with thickened gallbladder wall on [place] - for HIDA scan - per primary  2. Volume overload -May be contributing to abd pain. Has missed HD and on recent admits has been refusing inpatient dialysis. Needs serial HD inpt, have d/w patient.  Plan HD tomorrow.  Get to dry wt, possibly further, before dc home.  3. ESRD - HD MWF. Next HD 7/29 - max UF as tolerated 4. Anemia - Hgb 10.9. No ESA needs  5. MBD- Continue Hectorol/binders  6. HTN- BP remains elevated. IV meds here. Only on lisinopril 9m at home - may need ^ dose  7  [facility]  Hx prior DVT/ PE - on eliquis per home med list. Concern for RUE thrombosis - apparently was not taking Eliquis at home.  9. CM EF 35-40% 10.  Anxiety - mostly related to dyspnea    OLynnda ChildPA-C [facility] Pager [phone]/28/2019,10:51 AM  LOS: 1 day   Pt seen, examined and agree w A/P as above.  RKelly SplinterMD [facility] pager  [phone]  02/15/2018, 11:17 AM    Additional Objective Labs: Basic Metabolic Panel: Recent Labs  Lab 02/10/18 1428  02/10/18 1840 02/14/18 0841 02/15/18 0100  NA 138   < > 139 135 133*  K 5.6*   < > 6.2* 4.9 5.4*  CL 99   < > 103 98 97*  CO2 24  --   --  22 20*  GLUCOSE 102*   < > 57* 96 79  BUN 43*   < > 59* 63* 71*  CREATININE 11.25*   < > 12.40* 13.51* 14.12*  CALCIUM 8.5*  --   --  8.7* 8.7*  PHOS  --   --   --   --  9.7*   < > = values in this interval not displayed.   CBC: Recent Labs  Lab 02/09/18 1049  02/10/18 1428  02/10/18 1840 02/14/18 0841 02/15/18 0100  WBC 3.9*  --  3.4*  --   --  3.5* 3.8*  NEUTROABS  --   --  2.2  --   --  2.3  --   HGB 11.6*   < > 11.6*   < >  16.0 10.7* 10.9*  HCT 36.9*   < > 37.6*   < > 47.0 33.7* 33.2*  MCV 85.6  --  85.6  --   --  83.6 81.6  PLT 141*  --  149*  --   --  166 177   < > = values in this interval not displayed.   Blood Culture    Component Value Date/Time   SDES PLEURAL RIGHT 02/01/2018 0922   SDES PLEURAL RIGHT 02/01/2018 0922   SPECREQUEST NONE 02/01/2018 0922   SPECREQUEST NONE 02/01/2018 0922   CULT  02/01/2018 0922    NO GROWTH 5 DAYS Performed at [facility], [place] [address] [place], [place] [address]    REPTSTATUS 02/06/2018 FINAL 02/01/2018 0922   REPTSTATUS 02/02/2018 FINAL 02/01/2018 0922    Cardiac Enzymes: Recent Labs  Lab 02/09/18 1049 02/09/18 1657  TROPONINI 0.03* 0.03*   CBG: Recent Labs  Lab 02/11/18 0051 02/11/18 0223  GLUCAP 57* 62*   Iron Studies: No results for input(s): IRON, TIBC, TRANSFERRIN, FERRITIN in the last 72 hours. Lab Results  Component Value Date   INR 1.56 09/21/2017   INR 1.28 09/04/2017   INR 1.13 09/03/2017   Medications: . sodium chloride    . sodium chloride     . apixaban  5 mg Oral BID  . Chlorhexidine Gluconate Cloth  6 each Topical Q0600  . cinacalcet  90 mg Oral Q supper  . [START ON 02/18/2018] Darbepoetin Alfa  60 mcg  Intravenous Q Wed-HD  . docusate sodium  100 mg Oral BID  . lisinopril  5 mg Oral Daily  . ondansetron (ZOFRAN) IV  4 mg Intravenous Once  . pantoprazole  40 mg Oral Daily  . polyethylene glycol  17 g Oral Daily  . sevelamer carbonate  2,400 mg Oral TID WC  . sodium chloride flush  3 mL Intravenous Q12H

## 2018-02-15 NOTE — Progress Notes (Signed)
PT Cancellation Note  Patient Details Name: Frank [name] MRN: [id number] DOB: 09/29/1965   Cancelled Treatment:    Reason Eval/Treat Not Completed: Patient declined, no reason specified. Indicated he will work with [place] tomorrow. Noted that he will likely be going to dialysis daily for the remainder of the week. Called dialysis to ask if pt would be able to go in second round tomorrow so that we could attempt PT eval prior to HD.  Wynetta Fines, SPT Office #[phone] Wynetta Fines 02/15/2018, 4:10 PM

## 2018-02-16 DIAGNOSIS — N186 End stage renal disease: Secondary | ICD-10-CM

## 2018-02-16 DIAGNOSIS — Z515 Encounter for palliative care: Secondary | ICD-10-CM

## 2018-02-16 DIAGNOSIS — Z992 Dependence on renal dialysis: Secondary | ICD-10-CM

## 2018-02-16 DIAGNOSIS — Z7189 Other specified counseling: Secondary | ICD-10-CM

## 2018-02-16 LAB — CBC
HCT: 32.3 % — ABNORMAL LOW (ref 39.0–52.0)
Hemoglobin: 10.6 g/dL — ABNORMAL LOW (ref 13.0–17.0)
MCH: 27.1 pg (ref 26.0–34.0)
MCHC: 32.8 g/dL (ref 30.0–36.0)
MCV: 82.6 fL (ref 78.0–100.0)
PLATELETS: 164 10*3/uL (ref 150–400)
RBC: 3.91 MIL/uL — AB (ref 4.22–5.81)
RDW: 17.2 % — AB (ref 11.5–15.5)
WBC: 3.3 10*3/uL — AB (ref 4.0–10.5)

## 2018-02-16 LAB — BASIC METABOLIC PANEL
ANION GAP: 15 (ref 5–15)
BUN: 46 mg/dL — ABNORMAL HIGH (ref 6–20)
CALCIUM: 7.6 mg/dL — AB (ref 8.9–10.3)
CO2: 23 mmol/L (ref 22–32)
Chloride: 99 mmol/L (ref 98–111)
Creatinine, Ser: 10.7 mg/dL — ABNORMAL HIGH (ref 0.61–1.24)
GFR calc Af Amer: 6 mL/min — ABNORMAL LOW (ref >60)
GFR calc non Af Amer: 5 mL/min — ABNORMAL LOW (ref >60)
Glucose, Bld: 76 mg/dL (ref 70–99)
POTASSIUM: 4.9 mmol/L (ref 3.5–5.1)
SODIUM: 137 mmol/L (ref 135–145)

## 2018-02-16 LAB — GLUCOSE, CAPILLARY: GLUCOSE-CAPILLARY: 82 mg/dL (ref 70–99)

## 2018-02-16 MED ORDER — SODIUM CHLORIDE 0.9 % IV SOLN: 100.0000 mL | INTRAVENOUS | Status: DC | PRN

## 2018-02-16 MED ORDER — HYDROMORPHONE HCL 1 MG/ML IJ SOLN
INTRAMUSCULAR | Status: AC
  Administered 2018-02-16: 0.5 mg via INTRAVENOUS
  Filled 2018-02-16: qty 0.5

## 2018-02-16 MED ORDER — DOXERCALCIFEROL 4 MCG/2ML IV SOLN
INTRAVENOUS | Status: AC
  Filled 2018-02-16: qty 4

## 2018-02-16 MED ORDER — HYDROMORPHONE HCL 1 MG/ML IJ SOLN
INTRAMUSCULAR | Status: AC
  Filled 2018-02-16: qty 0.5

## 2018-02-16 MED ORDER — HYDROMORPHONE HCL 1 MG/ML IJ SOLN
0.5000 mg | Freq: Once | INTRAMUSCULAR | Status: AC
  Administered 2018-02-16: 0.5 mg via INTRAVENOUS

## 2018-02-16 MED ORDER — LIDOCAINE HCL (PF) 1 % IJ SOLN: 5.0000 mL | INTRAMUSCULAR | Status: DC | PRN

## 2018-02-16 MED ORDER — HEPARIN SODIUM (PORCINE) 1000 UNIT/ML DIALYSIS: 1000.0000 [IU] | INTRAMUSCULAR | Status: DC | PRN

## 2018-02-16 NOTE — Progress Notes (Signed)
HD tx initiated via 15Gx2 w/o problem, pull/push/flush well, VSS but w/ increased bp still today. Pt caused  A 30 min delay in starting his tx after he arrived and pre assess completed as he had just had prn Dilaudid 0.5 mg IVP @ 1836, I explained that the earliest he could get again was 2206, pt refused for me to access him until I called MD and got order for a 1x does (his words), after I got the call that an "additional" 0.90m IVP was ordered, pt was upset that it wasn't 1 mg IVP as that is what was ordered for him Saturday night. I explained that at this point he had delayed tx so long that he could get the next prn dose in less than an hour so he agreed to let me go ahead and put him on. Also, pt is describing pain for me and holding his LEFT LOWER quadrant rather than c/o  Right upper has in his notes I see and what other nurses are also documenting so I am not sure as to why he is telling me a different location at this time and on Saturday.

## 2018-02-16 NOTE — Progress Notes (Signed)
Offered Pt a bath. Pt asking doctor if he could get in shower. Dr. told Pt he could get in shower after dialysis. Tech gave Pt all bath supplies.

## 2018-02-16 NOTE — Progress Notes (Signed)
PT Cancellation Note  Patient Details Name: Frank [name] MRN: [id number] DOB: June 16, 1966   Cancelled Treatment:    Reason Eval/Treat Not Completed: Patient declined, no reason specified. Pt was almost compliant. Answered subjective questions regarding home environment and personal information (prior level of function was independent, lives alone, walks and drives, goal is to walk on own). Was able to get pt to perform AROM shoulder flexion of R arm with patient in bed, but pt declined all other forms of activity. Pt indicated that he would like to work with [place] later today. Will attempt to revisit patient later today with the intent to complete the PT eval before pt goes to dialysis, as time allows.   Wynetta Fines, SPT Office #[phone] Wynetta Fines 02/16/2018, 9:38 AM
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
## 2018-02-16 NOTE — Progress Notes (Signed)
PROGRESS NOTE  Frank [name] XUX:[id number] DOB: 1966/05/06 DOA: 02/14/2018 PCP: Patient, [name]  HPI/Recap of past 24 hours:  This is the 13th hospital admission for the 52 yo male Frank [name], since January 2019.  Eight of those admissions have been to our facility; 5 other admissions documented in care everywhere in the epic system.  Who presents to our emergency department with complaints of nausea vomiting and right lower quadrant abdominal pain for 2 days and shortness of breath with right arm swelling..  Medical history significant for hypertension, diet-controlled diabetes requiring no insulin at recent admission, gastroesophageal reflux disease, abdominal pain likely IBS or pain from mass in left kidney, depression, anxiety, DVT with history of pulmonary embolism on Eliquis, congestive heart failure with an ejection fraction of 35%, end-stage renal disease on hemodialysis Monday Wednesday Friday with a chronically elevated troponin and a recent episode of pancreatitis with pseudocyst (treated here and at [facility] in the spring 2019) and chronic right-sided pleural effusion thought secondary to nonadherence to hemodialysis with total body volume overload resulting in right sided pleural effusion.  CT scan 2 weeks ago showed chronic hepatic congestion.  Admitted for right upper quadrant abdominal pain.   02/15/2018: Patient examined at his bedside.  He reports persistent right upper quadrant abdominal pain.  HIDA scan unremarkable.   02/16/18: reports lump under his right axilla. ?hydranitisTender on palpation. States presents 1 day prior to presentation on Friday. No erythema or exudates. Afebrile with no leukocytosis. Will reassess in the am. If persistent will consult gen surgery.     Assessment/Plan: Principal Problem:   Right upper quadrant abdominal pain Active Problems:   Anemia of chronic kidney failure   Nausea & vomiting   DM (diabetes mellitus), type 2, uncontrolled, with renal  complications (HCC)   Essential hypertension   Adjustment disorder with mixed anxiety and depressed mood   Left renal mass   Constipation by delayed colonic transit   GERD (gastroesophageal reflux disease)   Complex sleep apnea syndrome   DVT (deep venous thrombosis) (HCC)   End stage renal disease on dialysis (HCC)   Benign hypertensive heart and kidney disease with systolic CHF, NYHA class 3 and CKD stage 5 (HCC)   Pleural effusion on right   PE (pulmonary thromboembolism) (HCC)   Hypoxemia   Pleural effusion, right   Superficial venous thrombosis of arm, right  Intractable right upper quadrant abdominal pain of unclear etiology HIDA scan unremarkable Continue to treat symptomatically Avoid morphine and tramadol Pain management and bowel regimen in place  Intractable nausea and vomiting, resolved Unclear etiology possibly secondary to uremia Had hemodialysis today 02/15/2018  History of recurrent pancreatitis Lipase unremarkable  Leukopenia, unclear etiology So sign of active infective process  Accelerated hypertension Continue home medications IV hydralazine as needed  Hyperkalemia in the setting of end-stage renal disease on dialysis Post hemodialysis today Repeat BMP in the morning Last potassium 5.4 early this morning  Superficial venous thrombosis affecting the right cephalic vein C/w eliquis  Right axillary swelling ? hydranitis Unknown etiology If persists will consult gen surgery  Hypervolemic hyponatremia Hyponatremia has resolved after dialysis Sodium 133 prior to dialysis on 02/15/2018 Repeat BMP tomorrow  Fluid overload 2/2 missing HD C/w HD as planned by nephrology Nephrology following  Anemia of chronic disease Most likely secondary to end-stage renal disease  Hemoglobin stable No sign of overt bleeding  History of DVT on Eliquis  Medical Non compliance with HD and diet  Code Status: Full code  Family Communication: None at  bedside  Disposition Plan: Home in 1 to 2 days when nephrology signs of   Consultants:  Nephrology  Palliative care  Procedures:  HD on 02/15/2018  HD 02/16/18  Antimicrobials:  None  DVT prophylaxis:  eliquis   Objective: Vitals:   02/15/18 0410 02/15/18 0436 02/15/18 1104 02/15/18 2028  BP: (!) 152/109 (!) 153/106 (!) 169/125 (!) 145/105  Pulse: 90 87 87 82  Resp: _0 Temp:  (!) 97.2 F (36.2 C)    TempSrc:  Oral    SpO2: 97% 94% 100% 97%  Weight:      Height:        Intake/Output Summary (Last 24 hours) at 02/16/2018 1634 Last data filed at 02/16/2018 1400 Gross per 24 hour  Intake 1650 ml  Output 0 ml  Net 1650 ml   Filed Weights   02/14/18 0655 02/14/18 1612  Weight: 74.8 kg (165 lb) 79.5 kg (175 lb 4.3 oz)    Exam:  . General: 52 y.o. year-old male Wd WN NAD A&O x3 . Cardiovascular: RRR no rubs or gallops. No jVD or thyromegaly. [name] Respiratory: Diffuse rales bilaterally with no wheezes. Good inspiratory effort. . Abdomen: Soft tenderness in the right upper quadrant abdomen nondistended with normal bowel sounds x4 quadrants. . Musculoskeletal: R axillary swelling. Dependent edema in upper and lower extremities . Psychiatry: Mood is anxious   Data Reviewed: CBC: Recent Labs  Lab 02/10/18 1428 02/10/18 1504 02/10/18 1840 02/14/18 0841 02/15/18 0100 02/16/18 0558  WBC 3.4*  --   --  3.5* 3.8* 3.3*  NEUTROABS 2.2  --   --  2.3  --   --   HGB 11.6* 13.6 16.0 10.7* 10.9* 10.6*  HCT 37.6* 40.0 47.0 33.7* 33.2* 32.3*  MCV 85.6  --   --  83.6 81.6 82.6  PLT 149*  --   --  166 177 403   Basic Metabolic Panel: Recent Labs  Lab 02/10/18 1428 02/10/18 1504 02/10/18 1840 02/14/18 0841 02/15/18 0100 02/16/18 0558  NA 138 136 139 135 133* 137  K 5.6* 5.4* 6.2* 4.9 5.4* 4.9  CL 99 100 103 98 97* 99  CO2 24  --   --  22 20* 23  GLUCOSE 102* 99 57* 96 79 76  BUN 43* 43* 59* 63* 71* 46*  CREATININE 11.25* 11.60* 12.40* 13.51* 14.12*  10.70*  CALCIUM 8.5*  --   --  8.7* 8.7* 7.6*  PHOS  --   --   --   --  9.7*  --    GFR: Estimated Creatinine Clearance: 9 mL/min (A) (by C-G formula based on SCr of 10.7 mg/dL (H)). Liver Function Tests: Recent Labs  Lab 02/14/18 0841 02/15/18 0100  AST 37  --   ALT 16  --   ALKPHOS 109  --   BILITOT 1.0  --   PROT 7.8  --   ALBUMIN 2.8* 2.8*   Recent Labs  Lab 02/14/18 0841  LIPASE 46   No results for input(s): AMMONIA in the last 168 hours. Coagulation Profile: No results for input(s): INR, PROTIME in the last 168 hours. Cardiac Enzymes: Recent Labs  Lab 02/09/18 1657  TROPONINI 0.03*   BNP (last 3 results) No results for input(s): PROBNP in the last 8760 hours. HbA1C: No results for input(s): HGBA1C in the last 72 hours. CBG: Recent Labs  Lab 02/11/18 0051 02/11/18 0223 02/16/18 0829  GLUCAP 57*  62* 82   Lipid Profile: No results for input(s): CHOL, HDL, LDLCALC, TRIG, CHOLHDL, LDLDIRECT in the last 72 hours. Thyroid Function Tests: No results for input(s): TSH, T4TOTAL, FREET4, T3FREE, THYROIDAB in the last 72 hours. Anemia Panel: No results for input(s): VITAMINB12, FOLATE, FERRITIN, TIBC, IRON, RETICCTPCT in the last 72 hours. Urine analysis:    Component Value Date/Time   COLORURINE YELLOW 10/18/2013 0419   APPEARANCEUR CLEAR 10/18/2013 0419   LABSPEC 1.008 10/18/2013 0419   PHURINE 8.5 (H) 10/18/2013 0419   GLUCOSEU 100 (A) 10/18/2013 0419   HGBUR TRACE (A) 10/18/2013 0419   BILIRUBINUR NEGATIVE 10/18/2013 0419   KETONESUR NEGATIVE 10/18/2013 0419   PROTEINUR 100 (A) 10/18/2013 0419   UROBILINOGEN 0.2 10/18/2013 0419   NITRITE NEGATIVE 10/18/2013 0419   LEUKOCYTESUR NEGATIVE 10/18/2013 0419   Sepsis Labs: _0 (procalcitonin:4,lacticidven:4)  ) Recent Results (from the past 240 hour(s))  MRSA PCR Screening     Status: None   Collection Time: 02/14/18  6:34 PM  Result Value Ref Range Status   MRSA by PCR NEGATIVE NEGATIVE Final     Comment:        The GeneXpert MRSA Assay (FDA approved for NASAL specimens only), is one component of a comprehensive MRSA colonization surveillance program. It is not intended to diagnose MRSA infection nor to guide or monitor treatment for MRSA infections. Performed at [facility], [place] [address], [place], [place] [address]       Studies: No results found.  Scheduled Meds: . apixaban  5 mg Oral BID  . Chlorhexidine Gluconate Cloth  6 each Topical Q0600  . cinacalcet  90 mg Oral Q supper  . [START ON 02/18/2018] Darbepoetin Alfa  60 mcg Intravenous Q Wed-HD  . docusate sodium  100 mg Oral BID  . doxercalciferol  7 mcg Intravenous Q M,W,F-HD  . lisinopril  5 mg Oral Daily  . pantoprazole  40 mg Oral Daily  . polyethylene glycol  17 g Oral Daily  . sevelamer carbonate  2,400 mg Oral TID WC  . sodium chloride flush  3 mL Intravenous Q12H    Continuous Infusions: . sodium chloride    . sodium chloride       LOS: 2 days     Kayleen Memos, MD [facility] Pager [phone]  If 7PM-7AM, please contact night-coverage [email or web address] Password [facility] 02/16/2018, 4:34 PM

## 2018-02-16 NOTE — Progress Notes (Signed)
Physical Therapy Evaluation Patient Details Name: Frank [name] MRN: [id number] DOB: 21-Nov-1965 Today's Date: 02/16/2018   History of Present Illness  This is the 13th hospital admission for the 52 yo male [name], since January 2019.  Eight of those admissions have been to our facility; 5 other admissions documented in care everywhere in the epic system.  Who presents to our emergency department with complaints of nausea vomiting and right lower quadrant abdominal pain for 2 days and shortness of breath with right arm swelling..  Medical history significant for hypertension, diet-controlled diabetes requiring no insulin at recent admission, gastroesophageal reflux disease, abdominal pain likely IBS or pain from mass in left kidney, depression, anxiety, DVT with history of pulmonary embolism on Eliquis, congestive heart failure with an ejection fraction of 35%, end-stage renal disease on hemodialysis Monday Wednesday Friday with a chronically elevated troponin and a recent episode of pancreatitis with pseudocyst (treated here and at [facility] in the spring 2019) and chronic right-sided pleural effusion thought secondary to nonadherence to hemodialysis with total body volume overload resulting in right sided pleural effusion.  CT scan 2 weeks ago showed chronic hepatic congestion.  Admitted for right upper quadrant abdominal pain.   Clinical Impression  Pt admitted with above diagnosis. Pt observed at beginning of session supine with HOB raised. Pt presented with dubious will to participate and UE weakness upon further examination. Prior to treatment, pt was able to answer subjective interview questions regarding home life and personal situation. Despite his initial resistance, pt complied to participate in PT after 3 canceled sessions. Pt was able to transfer out of bed from supine to sit, sit to stand and walk independently. Pt ambulated about 250 feet unassisted. Despite a slightly decreased cadence, pt  demonstrated a very stable and independent gait pattern. 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 Pt was educated on how PT can help reach this goal. Pt will benefit from skilled PT (and possibly OT as outpatient consult) to address functional/finer motor movements in R UE.    Follow Up Recommendations Outpatient PT, Outpatient OT    Equipment Recommendations  None recommended by PT    Recommendations for Other Services OT consult     Precautions / Restrictions        Mobility  Bed Mobility Overal bed mobility: Independent                Transfers Overall transfer level: Independent Equipment used: None                Ambulation/Gait Ambulation/Gait assistance: Modified independent (Device/Increase time) Gait Distance (Feet): 250 Feet or greater Assistive device: None          Stairs            Wheelchair Mobility    Modified Rankin (Stroke Patients Only)       Balance                                             Pertinent Vitals/Pain Pain Assessment: 0-10 Pain Score: 8  Pain Location: RUE, axilla Pain Descriptors / Indicators: (Fullness) Pain Intervention(s): Patient requesting pain meds-RN notified  Home Living Family/patient expects to be discharged to:: Private residence Living Arrangements: Alone   Type of Home: Apartment Home Access: Stairs to enter   Entrance Stairs-Number of Steps: 8   Home Equipment: None Additional Comments: 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 States that he needs to get out of hospital to go back to work.    Prior Function Level of Independence: Independent         Comments: Walks well. Complains of a lack of upper body strength and is eager to improve  it.     Hand Dominance   Dominant Hand: Right    Extremity/Trunk Assessment   Upper Extremity Assessment Upper Extremity Assessment: RUE deficits/detail RUE Deficits / Details: Edema from superficial thrombosis limits strength, but ROM is [facility] RUE Coordination: decreased gross motor    Lower Extremity Assessment Lower Extremity Assessment: Overall WFL for tasks assessed       Communication   Communication: No difficulties  Cognition Arousal/Alertness: Awake/alert Behavior During Therapy: Flat affect Overall Cognitive Status: Within Functional Limits for tasks assessed                                 General Comments: Was initially irritated by our continued attempts to get him out of bed, however, eventually complied and walked. 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 PT Goal Formulation: With patient Time For Goal Achievement: 03/02/18 Potential to Achieve Goals: Fair    Frequency     Barriers to discharge        Co-evaluation               AM-PAC PT "6 Clicks" Daily Activity  Outcome Measure Difficulty turning over in bed (including adjusting bedclothes, sheets and blankets)?: None Difficulty moving from lying on back to sitting on the side of the bed? : None Difficulty sitting down on and standing up from a chair with arms (e.g., wheelchair, bedside commode, etc,.)?: None Help needed  moving to and from a bed to chair (including a wheelchair)?: None Help needed walking in hospital room?: None Help needed climbing 3-5 steps with a railing? : A Little 6 Click Score: 23    End of Session   Activity Tolerance: Patient tolerated treatment well     PT Visit Diagnosis: Muscle weakness (generalized) (M62.81)(UE)    Time: 3406-8403 PT Time Calculation (min) (ACUTE ONLY): 27 min   Charges:   PT Evaluation $PT Eval Low Complexity: 1 Low PT Treatments $Gait Training: 8-22 mins        Wynetta Fines, SPT Office #[phone]  Wynetta Fines 02/16/2018, 12:58 PM

## 2018-02-16 NOTE — Progress Notes (Addendum)
[facility] Progress Note   Dialysis Orders: MWF [place] 4h 72kg 2/2.25 bath Hep none L AVF - hectorol 7 ug tiw - mircera 50 every 4 wks - venofer 50 / wk  Assessment/Plan: 1. Abd pain N/V -  RUQ pain with thickened gallbladder wall on [place] - normal HIDA scan - per primary  2. Volume overload - misses tmts due to multiple ED visits and not running full time.; net UF 5 L - completing treatment Sunday at 4 am - not weighed 3. ESRD - HD MWF. Next HD today(on sched) - max UF as tolerated k 4.9 4. Anemia - Hgb 10.6. - last Mircera dose 225 7/3 - to have mircera 50 resumed this week - favor resuming at a higher dose given history of outpatient hgb - 60 aranesp per week ordered here - has ferrlicit listed as allergy - resume venofer at d/c 5. MBD- Continue Hectorol/binders  6. HTN- BP remains elevated. Only on lisinopril 23m at home - may need ^ dose. continue to decrease volulme as I think that will be key  7 [facility]urrent [facility] fluid [facility] CT 7/23 last admission 8 Hx prior DVT/ PE - on eliquis per home med list. Concern for RUE thrombosis - apparently was not taking Eliquis at home.  9. CM EF 35-40% 10.  new  tender swelling in right axilla - no overt erythema/fluctuance -no fevers or leukocytosis - defer to primary 11. Hx left renal mass - 3.3 cm thick walled complex lesion - f/u with urology- this can be done as an outpatient - not new  12. Hx DVT on [place] PA-C [id number]/29/2019,9:22 AM  LOS: 2 days    Patient seen and examined, agree with above note with above modifications. Still c/o abd pain and this new right arm/axilla pain- not sure what due to.  Planning Hd today on schedule- get volume down should help with BP   KCorliss Parish MD 02/16/2018     Subjective:   Notes new area of swelling in right axilla - tender - just found incidentally - hurts  to raise right arm. It is his understanding that urology was going to see him in the hospital for the left renal mass "they told me that Sunday"  Objective Vitals:   02/15/18 0410 02/15/18 0436 02/15/18 1104 02/15/18 2028  BP: (!) 152/109 (!) 153/106 (!) 169/125 (!) 145/105  Pulse: 90 87 87 82  Resp: _0 Temp:  (!) 97.2 F (36.2 C)    TempSrc:  Oral    SpO2: 97% 94% 100% 97%  Weight:      Height:       Physical Exam General: NAD  Sitting in bed breathing easily - working with PT Heart: RRR Lungs: dim BS poor effort few crackles left base  Abdomen: soft RUQ tenderness + BS Extremities: no RLE edema Dialysis Access: left AVF + bruit   Additional Objective Labs: Basic Metabolic Panel: Recent Labs  Lab 02/14/18 0841 02/15/18 0100 02/16/18 0558  NA 135 133* 137  K 4.9 5.4* 4.9  CL 98 97* 99  CO2 22 20* 23  GLUCOSE 96 79 76  BUN 63* 71* 46*  CREATININE 13.51* 14.12* 10.70*  CALCIUM 8.7* 8.7* 7.6*  PHOS  --  9.7*  --    Liver Function Tests: Recent Labs  Lab 02/14/18 0841 02/15/18 0100  AST  37  --   ALT 16  --   ALKPHOS 109  --   BILITOT 1.0  --   PROT 7.8  --   ALBUMIN 2.8* 2.8*   Recent Labs  Lab 02/14/18 0841  LIPASE 46   CBC: Recent Labs  Lab 02/09/18 1049  02/10/18 1428  02/14/18 0841 02/15/18 0100 02/16/18 0558  WBC 3.9*  --  3.4*  --  3.5* 3.8* 3.3*  NEUTROABS  --   --  2.2  --  2.3  --   --   HGB 11.6*   < > 11.6*   < > 10.7* 10.9* 10.6*  HCT 36.9*   < > 37.6*   < > 33.7* 33.2* 32.3*  MCV 85.6  --  85.6  --  83.6 81.6 82.6  PLT 141*  --  149*  --  166 177 164   < > = values in this interval not displayed.   Blood Culture    Component Value Date/Time   SDES PLEURAL RIGHT 02/01/2018 0922   SDES PLEURAL RIGHT 02/01/2018 0922   SPECREQUEST NONE 02/01/2018 0922   SPECREQUEST NONE 02/01/2018 0922   CULT  02/01/2018 0922    NO GROWTH 5 DAYS Performed at [facility], [place] [address], [place], [place] [address]    REPTSTATUS  02/06/2018 FINAL 02/01/2018 0922   REPTSTATUS 02/02/2018 FINAL 02/01/2018 0922    Cardiac Enzymes: Recent Labs  Lab 02/09/18 1049 02/09/18 1657  TROPONINI 0.03* 0.03*   CBG: Recent Labs  Lab 02/11/18 0051 02/11/18 0223 02/16/18 0829  GLUCAP 57* 62* 82   Iron Studies: No results for input(s): IRON, TIBC, TRANSFERRIN, FERRITIN in the last 72 hours. Lab Results  Component Value Date   INR 1.56 09/21/2017   INR 1.28 09/04/2017   INR 1.13 09/03/2017   Studies/Results: Nm Hepatobiliary Liver Func  Result Date: 02/15/2018 CLINICAL DATA:  Inpatient.  Right upper quadrant abdominal pain. EXAM: NUCLEAR MEDICINE HEPATOBILIARY IMAGING TECHNIQUE: Sequential images of the abdomen were obtained out to 60 minutes following intravenous administration of radiopharmaceutical. RADIOPHARMACEUTICALS:  5.3 mCi Tc-80m Choletec IV COMPARISON:  02/04/2018 CT abdomen/pelvis. 02/14/2018 abdominal sonogram. FINDINGS: Prompt uptake and biliary excretion of activity by the liver is seen. Gallbladder activity is visualized, consistent with patency of cystic duct. Biliary activity passes into small bowel, consistent with patent common bile duct. IMPRESSION: Normal hepatobiliary scintigraphy study. Patent cystic duct, which is not compatible with acute cholecystitis. Patent common bile duct. Electronically Signed   By: JIlona SorrelM.D.   On: 02/15/2018 10:24   Medications: . sodium chloride    . sodium chloride     . apixaban  5 mg Oral BID  . Chlorhexidine Gluconate Cloth  6 each Topical Q0600  . cinacalcet  90 mg Oral Q supper  . [START ON 02/18/2018] Darbepoetin Alfa  60 mcg Intravenous Q Wed-HD  . docusate sodium  100 mg Oral BID  . doxercalciferol  7 mcg Intravenous Q M,W,F-HD  . lisinopril  5 mg Oral Daily  . ondansetron (ZOFRAN) IV  4 mg Intravenous Once  . pantoprazole  40 mg Oral Daily  . polyethylene glycol  17 g Oral Daily  . sevelamer carbonate  2,400 mg Oral TID WC  . sodium chloride flush   3 mL Intravenous Q12H

## 2018-02-17 LAB — GLUCOSE, CAPILLARY: Glucose-Capillary: 96 mg/dL (ref 70–99)

## 2018-02-17 MED ORDER — CHLORHEXIDINE GLUCONATE CLOTH 2 % EX PADS: 6.0000 | MEDICATED_PAD | Freq: Every day | CUTANEOUS | Status: DC

## 2018-02-17 MED ORDER — RENA-VITE PO TABS
1.0000 | ORAL_TABLET | Freq: Every day | ORAL | Status: DC
  Administered 2018-02-17: 1 via ORAL
  Filled 2018-02-17: qty 1

## 2018-02-17 NOTE — Progress Notes (Signed)
PT Cancellation Note  Patient Details Name: Frank [name] MRN: [id number] DOB: 05-Dec-1965   Cancelled Treatment:    Reason Eval/Treat Not Completed: Patient declined, no reason specified. Pt politely declined treatment, stating "come back after lunch." Was able to introduce UE strengthening exercise packet and therex bands, but pt did not participate other than listening to explanation of exercises. Will return after lunch to attempt to conduct treatment session with pt.   Wynetta Fines, SPT Office #[phone]   Wynetta Fines 02/17/2018, 12:15 PM

## 2018-02-17 NOTE — Progress Notes (Addendum)
HD tx completed @ 0120 w/o problem, UF goal that pt set (4000 instead of 4500) met, blood rinsed back, VSS w/ increased bp, report called to Sima Matas, RN

## 2018-02-17 NOTE — Progress Notes (Addendum)
[facility] Progress Note   Dialysis Orders: MWF [place] 4h 72kg 2/2.25 bath Hep none L AVF - hectorol 7 ug tiw - mircera 50 every 4 wks - venofer 50 / wk  Assessment/Plan: 1.Abd pain N/V - RUQ pain with thickened gallbladder wall on [place] - normal HIDA scan - per primary  2. Volume overload - misses tmts due to multiple ED visits and not running full time.; net UF 5 L - completing treatment Sunday at 4 am . Had Monday treatment that completed at 120 am today;  Pt elected to reduce net goal to 4 L instead of 4.5 L during Monday treatment. Sats ok on room air.Post wt 75 kg - current EDW is 72 - may need to be lower 3. ESRD -HD MWF. Next HD Wed on schedule - no acute need for dialysis today- his volume can be reduced serially with adequate HD goals. 4. Anemia -Hgb 10.6. - last Mircera dose 225 7/3 - to have mircera 50 resumed this week at [facility]; have ordered 60 aranesp per week here - has ferrlicit listed as allergy - resume venofer at d/c 5. MBD-Continue Hectorol/binders  6. HTN- BP remains elevated. Only on lisinopril 6m at home; had dose before HD yesterday  - may need ^ dose. continue to decrease volulme as I think that will be key  7[facility] 7/23 last admission 8Hx prior DVT/ PE - on eliquis per home med list. Concern for RUE thrombosis - apparently was not taking Eliquis at home.  9.CM EF35-40% 10. new  tender swelling in right axilla - no overt erythema/fluctuance -no fevers or leukocytosis - defer to primary- somewhat better today 11. Hx left renal mass - 3.3 cm thick walled complex lesion - f/u with urology- this can be done as an outpatient - not new   MMyriam Jacobson PA-C [id number]/30/2019,9:27 AM  LOS: 3 days    Patient seen and examined, agree with above note with above modifications. Still having the pain in his abdomen and arm but  says better- HD with 4 liters removal lost night went well.  Agreeable to go home tomorrow after HD - cont to challenge volume as I think that will bring BP down  KCorliss Parish MD 02/17/2018     Subjective:   Thinks he should have HD today and tomorrow to get volume down.  Thinks axilla swelling a little better.  HD yest- removed 4000- said pain kept him up all night but seems fine right now  Objective Vitals:   02/17/18 0120 02/17/18 0136 02/17/18 0200 02/17/18 0803  BP: (!) 156/101 (!) 138/94 (!) 144/97 (!) 137/102  Pulse: 91 87 94 96  Resp: _0 Temp:  98.3 F (36.8 C) 98.5 F (36.9 C)   TempSrc:  Oral    SpO2: 93% 92%  95%  Weight:  75 kg (165 lb 5.5 oz)    Height:       Physical Exam General: NAD breathing easily on room air Heart: RRR Lungs: no overt rales  Axilla right - swelling somewhat decreased Abdomen: soft mild RUQ tenderness Extremities: no sig LE edema Dialysis Access: left AVF   Additional Objective Labs: Basic Metabolic Panel: Recent Labs  Lab 02/14/18 0841 02/15/18 0100 02/16/18 0558  NA 135 133* 137  K 4.9 5.4* 4.9  CL 98 97* 99  CO2 22 20* 23  GLUCOSE 96 79 76  BUN 63* 71* 46*  CREATININE 13.51* 14.12* 10.70*  CALCIUM 8.7* 8.7* 7.6*  PHOS  --  9.7*  --    Liver Function Tests: Recent Labs  Lab 02/14/18 0841 02/15/18 0100  AST 37  --   ALT 16  --   ALKPHOS 109  --   BILITOT 1.0  --   PROT 7.8  --   ALBUMIN 2.8* 2.8*   Recent Labs  Lab 02/14/18 0841  LIPASE 46   CBC: Recent Labs  Lab 02/10/18 1428  02/14/18 0841 02/15/18 0100 02/16/18 0558  WBC 3.4*  --  3.5* 3.8* 3.3*  NEUTROABS 2.2  --  2.3  --   --   HGB 11.6*   < > 10.7* 10.9* 10.6*  HCT 37.6*   < > 33.7* 33.2* 32.3*  MCV 85.6  --  83.6 81.6 82.6  PLT 149*  --  166 177 164   < > = values in this interval not displayed.   Blood Culture    Component Value Date/Time   SDES PLEURAL RIGHT 02/01/2018 0922   SDES PLEURAL RIGHT 02/01/2018 0922    SPECREQUEST NONE 02/01/2018 0922   SPECREQUEST NONE 02/01/2018 0922   CULT  02/01/2018 0922    NO GROWTH 5 DAYS Performed at [facility], [place] [address], [place], [place] [address]    REPTSTATUS 02/06/2018 FINAL 02/01/2018 0922   REPTSTATUS 02/02/2018 FINAL 02/01/2018 0922    Cardiac Enzymes: No results for input(s): CKTOTAL, CKMB, CKMBINDEX, TROPONINI in the last 168 hours. CBG: Recent Labs  Lab 02/11/18 0051 02/11/18 0223 02/16/18 0829 02/17/18 0801  GLUCAP 57* 62* 82 96   Iron Studies: No results for input(s): IRON, TIBC, TRANSFERRIN, FERRITIN in the last 72 hours. Lab Results  Component Value Date   INR 1.56 09/21/2017   INR 1.28 09/04/2017   INR 1.13 09/03/2017   Studies/Results: Nm Hepatobiliary Liver Func  Result Date: 02/15/2018 CLINICAL DATA:  Inpatient.  Right upper quadrant abdominal pain. EXAM: NUCLEAR MEDICINE HEPATOBILIARY IMAGING TECHNIQUE: Sequential images of the abdomen were obtained out to 60 minutes following intravenous administration of radiopharmaceutical. RADIOPHARMACEUTICALS:  5.3 mCi Tc-61m Choletec IV COMPARISON:  02/04/2018 CT abdomen/pelvis. 02/14/2018 abdominal sonogram. FINDINGS: Prompt uptake and biliary excretion of activity by the liver is seen. Gallbladder activity is visualized, consistent with patency of cystic duct. Biliary activity passes into small bowel, consistent with patent common bile duct. IMPRESSION: Normal hepatobiliary scintigraphy study. Patent cystic duct, which is not compatible with acute cholecystitis. Patent common bile duct. Electronically Signed   By: JIlona SorrelM.D.   On: 02/15/2018 10:24   Medications: . sodium chloride    . sodium chloride    . sodium chloride    . sodium chloride     . apixaban  5 mg Oral BID  . Chlorhexidine Gluconate Cloth  6 each Topical Q0600  . cinacalcet  90 mg Oral Q supper  . [START ON 02/18/2018] Darbepoetin Alfa  60 mcg Intravenous Q Wed-HD  . docusate sodium  100 mg Oral BID   . doxercalciferol      . doxercalciferol  7 mcg Intravenous Q M,W,F-HD  . HYDROmorphone      . lisinopril  5 mg Oral Daily  . pantoprazole  40 mg Oral Daily  . polyethylene glycol  17 g Oral Daily  . sevelamer carbonate  2,400 mg Oral TID WC  . sodium chloride flush  3 mL Intravenous Q12H

## 2018-02-17 NOTE — Progress Notes (Signed)
PROGRESS NOTE  Frank [name] WFU:[id number] DOB: [birth year] DOA: 02/14/2018 PCP: Patient, [name]  HPI/Recap of past 24 hours:  This is the 13th hospital admission for the 52 yo male Frank [name], since January 2019.  Eight of those admissions have been to our facility; 5 other admissions documented in care everywhere in the epic system.  Who presents to our emergency department with complaints of nausea vomiting and right lower quadrant abdominal pain for 2 days and shortness of breath with right arm swelling..  Medical history significant for hypertension, diet-controlled diabetes requiring no insulin at recent admission, gastroesophageal reflux disease, abdominal pain likely IBS or pain from mass in left kidney, depression, anxiety, DVT with history of pulmonary embolism on Eliquis, congestive heart failure with an ejection fraction of 35%, end-stage renal disease on hemodialysis Monday Wednesday Friday with a chronically elevated troponin and a recent episode of pancreatitis with pseudocyst (treated here and at [facility] in the [facility] 2019) and chronic right-sided pleural effusion thought secondary to nonadherence to hemodialysis with total body volume overload resulting in right sided pleural effusion.  CT scan 2 weeks ago showed chronic hepatic congestion.  Admitted for right upper quadrant abdominal pain.   Hospital course complicated by fluid overload due to noncompliance with hemodialysis and diet requiring multiple rounds of hemodialysis.  Reports lump under his right axilla which is improving.  Afebrile with no leukocytosis.  02/17/2018: Patient seen and examined at his bedside.  He has no new complaints.  Had hemodialysis today 02/17/2018 and planning on additional dialysis for fluid removal on 02/18/2018 prior to possible discharge.   Assessment/Plan: Principal Problem:   Right upper quadrant abdominal pain Active Problems:   Anemia of chronic kidney failure   Nausea & vomiting   DM  (diabetes mellitus), type 2, uncontrolled, with renal complications (HCC)   Essential hypertension   Adjustment disorder with mixed anxiety and depressed mood   Left renal mass   Constipation by delayed colonic transit   GERD (gastroesophageal reflux disease)   Complex sleep apnea syndrome   DVT (deep venous thrombosis) (HCC)   End stage renal disease on dialysis ([place])   Benign hypertensive heart and kidney disease with systolic CHF, NYHA class 3 and CKD stage 5 (HCC)   Pleural effusion on right   PE (pulmonary thromboembolism) (HCC)   Hypoxemia   Pleural effusion, right   Superficial venous thrombosis of arm, right  Improving intractable right upper quadrant abdominal pain of unclear etiology HIDA scan unremarkable Continue to treat symptomatically Avoid morphine and tramadol Pain management and bowel regimen in place  Fluid overload secondary to hemodialysis and diet noncompliance Requiring multiple rounds of hemodialysis Had hemodialysis 02/15/2018, 7/29, 7/30 Planned hemodialysis again on 02/18/2018  Intractable nausea and vomiting, resolved Suspect secondary to uremia Had hemodialysis 02/15/2018, 7/29, 7/30 Planned hemodialysis again on 02/18/2018  History of recurrent pancreatitis Lipase unremarkable  Leukopenia, unclear etiology So sign of active infective process  Accelerated hypertension Continue home medications IV hydralazine as needed  Hyperkalemia in the setting of end-stage renal disease on dialysis, resolved  Superficial venous thrombosis affecting the right cephalic vein C/w eliquis  Right axillary swelling, improving ? hydranitis Unknown etiology  Hypervolemic hyponatremia, resolved Hyponatremia has resolved after dialysis Sodium 133 prior to dialysis on 02/15/2018  Anemia of chronic disease Most likely secondary to end-stage renal disease  Hemoglobin stable No sign of overt bleeding  History of DVT on Eliquis Continue Eliquis  Goals of  care Palliative care  consulted and following     Code Status: Full code  Family Communication: None at bedside  Disposition Plan: Home possibly tomorrow 02/18/2018 when nephrology signs off.   Consultants:  Nephrology  Palliative care  Procedures:  HD on 02/15/2018  HD 02/16/18  Antimicrobials:  None  DVT prophylaxis:  eliquis   Objective: Vitals:   02/17/18 0136 02/17/18 0200 02/17/18 0803 02/17/18 1654  BP: (!) 138/94 (!) 144/97 (!) 137/102 (!) 142/92  Pulse: 87 94 96 92  Resp: _0 Temp: 98.3 F (36.8 C) 98.5 F (36.9 C)  98.4 F (36.9 C)  TempSrc: Oral   Oral  SpO2: 92%  95% 96%  Weight: 75 kg (165 lb 5.5 oz)     Height:        Intake/Output Summary (Last 24 hours) at 02/17/2018 1742 Last data filed at 02/17/2018 1412 Gross per 24 hour  Intake 900 ml  Output 4004 ml  Net -3104 ml   Filed Weights   02/16/18 2050 02/17/18 0136  Weight: 79 kg (174 lb 2.6 oz) 75 kg (165 lb 5.5 oz)    Exam:  . General: 52 y.o. year-old male well-developed well-nourished no acute distress.  Alert and oriented x3. . Cardiovascular: Regular rate and rhythm with no rubs or gallops.  No JVD or thyromegaly noted. [name] Respiratory: Clear to auscultation with no wheezes or rales.  Good inspiratory effort. . Abdomen: Soft tenderness in the right upper quadrant abdomen nondistended with normal bowel sounds x4 quadrants. . Musculoskeletal: R axillary swelling. Dependent edema in upper and lower extremities . Psychiatry: Mood is anxious   Data Reviewed: CBC: Recent Labs  Lab 02/10/18 1840 02/14/18 0841 02/15/18 0100 02/16/18 0558  WBC  --  3.5* 3.8* 3.3*  NEUTROABS  --  2.3  --   --   HGB 16.0 10.7* 10.9* 10.6*  HCT 47.0 33.7* 33.2* 32.3*  MCV  --  83.6 81.6 82.6  PLT  --  166 177 914   Basic Metabolic Panel: Recent Labs  Lab 02/10/18 1840 02/14/18 0841 02/15/18 0100 02/16/18 0558  NA 139 135 133* 137  K 6.2* 4.9 5.4* 4.9  CL 103 98 97* 99  CO2  --  22  20* 23  GLUCOSE 57* 96 79 76  BUN 59* 63* 71* 46*  CREATININE 12.40* 13.51* 14.12* 10.70*  CALCIUM  --  8.7* 8.7* 7.6*  PHOS  --   --  9.7*  --    GFR: Estimated Creatinine Clearance: 8.5 mL/min (A) (by C-G formula based on SCr of 10.7 mg/dL (H)). Liver Function Tests: Recent Labs  Lab 02/14/18 0841 02/15/18 0100  AST 37  --   ALT 16  --   ALKPHOS 109  --   BILITOT 1.0  --   PROT 7.8  --   ALBUMIN 2.8* 2.8*   Recent Labs  Lab 02/14/18 0841  LIPASE 46   No results for input(s): AMMONIA in the last 168 hours. Coagulation Profile: No results for input(s): INR, PROTIME in the last 168 hours. Cardiac Enzymes: No results for input(s): CKTOTAL, CKMB, CKMBINDEX, TROPONINI in the last 168 hours. BNP (last 3 results) No results for input(s): PROBNP in the last 8760 hours. HbA1C: No results for input(s): HGBA1C in the last 72 hours. CBG: Recent Labs  Lab 02/11/18 0051 02/11/18 0223 02/16/18 0829 02/17/18 0801  GLUCAP 57* 62* 82 96   Lipid Profile: No results for input(s): CHOL, HDL, LDLCALC, TRIG, CHOLHDL, LDLDIRECT in the last  72 hours. Thyroid Function Tests: No results for input(s): TSH, T4TOTAL, FREET4, T3FREE, THYROIDAB in the last 72 hours. Anemia Panel: No results for input(s): VITAMINB12, FOLATE, FERRITIN, TIBC, IRON, RETICCTPCT in the last 72 hours. Urine analysis:    Component Value Date/Time   COLORURINE YELLOW 10/18/2013 0419   APPEARANCEUR CLEAR 10/18/2013 0419   LABSPEC 1.008 10/18/2013 0419   PHURINE 8.5 (H) 10/18/2013 0419   GLUCOSEU 100 (A) 10/18/2013 0419   HGBUR TRACE (A) 10/18/2013 0419   BILIRUBINUR NEGATIVE 10/18/2013 0419   KETONESUR NEGATIVE 10/18/2013 0419   PROTEINUR 100 (A) 10/18/2013 0419   UROBILINOGEN 0.2 10/18/2013 0419   NITRITE NEGATIVE 10/18/2013 0419   LEUKOCYTESUR NEGATIVE 10/18/2013 0419   Sepsis Labs: _0 (procalcitonin:4,lacticidven:4)  ) Recent Results (from the past 240 hour(s))  MRSA PCR Screening     Status:  None   Collection Time: 02/14/18  6:34 PM  Result Value Ref Range Status   MRSA by PCR NEGATIVE NEGATIVE Final    Comment:        The GeneXpert MRSA Assay (FDA approved for NASAL specimens only), is one component of a comprehensive MRSA colonization surveillance program. It is not intended to diagnose MRSA infection nor to guide or monitor treatment for MRSA infections. Performed at [facility], [place] [address], [place], [place] [address]       Studies: No results found.  Scheduled Meds: . apixaban  5 mg Oral BID  . Chlorhexidine Gluconate Cloth  6 each Topical Q0600  . cinacalcet  90 mg Oral Q supper  . [START ON 02/18/2018] Darbepoetin Alfa  60 mcg Intravenous Q Wed-HD  . docusate sodium  100 mg Oral BID  . doxercalciferol  7 mcg Intravenous Q M,W,F-HD  . lisinopril  5 mg Oral Daily  . multivitamin  1 tablet Oral QHS  . pantoprazole  40 mg Oral Daily  . polyethylene glycol  17 g Oral Daily  . sevelamer carbonate  2,400 mg Oral TID WC  . sodium chloride flush  3 mL Intravenous Q12H    Continuous Infusions: . sodium chloride    . sodium chloride    . sodium chloride    . sodium chloride       LOS: 3 days     Kayleen Memos, MD [facility] Pager [phone]  If 7PM-7AM, please contact night-coverage [email or web address] Password [email or web address] 02/17/2018, 5:42 PM

## 2018-02-17 NOTE — Progress Notes (Signed)
PT Cancellation Note  Patient Details Name: Frank [name] MRN: [id number] DOB: 18-Aug-1965   Cancelled Treatment:    Reason Eval/Treat Not Completed: Patient declined, no reason specified. Pt stated he was in too much pain and would not work with [place] at all. Asks [place] to return tomorrow. Will attempt treatment session again tomorrow.   Wynetta Fines, SPT Office #[phone]    Wynetta Fines 02/17/2018, 4:10 PM

## 2018-02-18 DIAGNOSIS — J90 Pleural effusion, not elsewhere classified: Secondary | ICD-10-CM

## 2018-02-18 DIAGNOSIS — I82611 Acute embolism and thrombosis of superficial veins of right upper extremity: Secondary | ICD-10-CM

## 2018-02-18 LAB — GLUCOSE, CAPILLARY: Glucose-Capillary: 127 mg/dL — ABNORMAL HIGH (ref 70–99)

## 2018-02-18 MED ORDER — PANTOPRAZOLE SODIUM 40 MG PO TBEC: 40.0000 mg | DELAYED_RELEASE_TABLET | Freq: Every day | ORAL | 0 refills | Status: DC

## 2018-02-18 MED ORDER — OXYCODONE-ACETAMINOPHEN 5-325 MG PO TABS: 1.0000 | ORAL_TABLET | Freq: Two times a day (BID) | ORAL | 0 refills | Status: DC | PRN

## 2018-02-18 MED ORDER — APIXABAN 5 MG PO TABS: 5.0000 mg | ORAL_TABLET | Freq: Two times a day (BID) | ORAL | 0 refills | Status: DC

## 2018-02-18 MED ORDER — ALPRAZOLAM 0.5 MG PO TABS: 0.2500 mg | ORAL_TABLET | Freq: Two times a day (BID) | ORAL | 0 refills | Status: DC | PRN

## 2018-02-18 MED ORDER — POLYETHYLENE GLYCOL 3350 17 G PO PACK: 17.0000 g | PACK | Freq: Every day | ORAL | 0 refills | Status: DC

## 2018-02-18 MED ORDER — RENA-VITE PO TABS: 1.0000 | ORAL_TABLET | Freq: Every day | ORAL | 0 refills | Status: DC

## 2018-02-18 NOTE — Progress Notes (Signed)
[facility] Progress Note   Dialysis Orders: MWF [place] 4h 72kg 2/2.25 bath Hep none L AVF - hectorol 7 ug tiw - mircera 50 every 4 wks - venofer 50 / wk  Assessment/Plan: 1.Abd pain N/V - RUQ pain with thickened gallbladder wall on [place] - normal HIDA scan - per primary  2. Volume overload - misses tmts due to multiple ED visits and not running full time.; net UF 5 L - completing treatment Sunday at 4 am . Had Monday treatment that completed at 120 am today;  Pt elected to reduce net goal to 4 L instead of 4.5 L during Monday treatment. Sats ok on room air.Post wt 75 kg - current EDW is 72 - may need to be lower.  Working for discharge this am to follow up with outpatient HD unit- for his usual time 3. ESRD -HD MWF. As per #2. 4. Anemia -Hgb 10.6. - last Mircera dose 225 7/3 - to have mircera 50 resumed this week at [facility]; have ordered 60 aranesp per week here - has ferrlicit listed as allergy - resume venofer at d/c 5. MBD-Continue Hectorol/binders  6. HTN- BP remains elevated. Only on lisinopril 63m at home; had dose before HD yesterday  - may need ^ dose. continue to decrease volulme as I think that will be key - titrate edw as able in outpatient setting 7[facility]/23 last admission 8Hx prior DVT/ PE - on eliquis per home med list. Concern for RUE thrombosis - apparently was not taking Eliquis at home.  9.CM EF35-40% 10. new  tender swelling in right axilla - no overt erythema/fluctuance -no fevers or leukocytosis - defer to primary- somewhat better today 11. Hx left renal mass - 3.3 cm thick walled complex lesion - f/u with urology- this can be done as an outpatient - not new  12.  superficial venous thrombosis right ceph vein/also hx DVT - on Eliquis  MMyriam Jacobson PA-C [id number]/31/2019,9:44 AM  LOS: 4 days    Subjective:   Eating  soup and saltines - per RN has been walking to [employer] to get snacks.  [facility]  Still has some RUQ pain.  No SOB.  Anxiety at times.  Objective Vitals:   02/17/18 1654 02/17/18 2242 02/18/18 0509 02/18/18 0734  BP: (!) 142/92 139/86 (!) 152/123 (!) 145/100  Pulse: 92 89 87 79  Resp: _0 Temp: 98.4 F (36.9 C) 98.7 F (37.1 C) 98.4 F (36.9 C) (!) 97.3 F (36.3 C)  TempSrc: Oral Oral  Oral  SpO2: 96% 98% 100% 96%  Weight:      Height:       Physical Exam General: NAD breathing easily on room air Heart: RRR dim bases Lungs: no overt rales  Axilla right - swelling somewhat decreased Abdomen: soft mild RUQ tenderness Extremities:tr LE edema Dialysis Access: left AVF + bruit   Additional Objective Labs: Basic Metabolic Panel: Recent Labs  Lab 02/14/18 0841 02/15/18 0100 02/16/18 0558  NA 135 133* 137  K 4.9 5.4* 4.9  CL 98 97* 99  CO2 22 20* 23  GLUCOSE 96 79 76  BUN 63* 71* 46*  CREATININE 13.51* 14.12* 10.70*  CALCIUM 8.7* 8.7* 7.6*  PHOS  --  9.7*  --    Liver Function Tests: Recent Labs  Lab 02/14/18  0841 02/15/18 0100  AST 37  --   ALT 16  --   ALKPHOS 109  --   BILITOT 1.0  --   PROT 7.8  --   ALBUMIN 2.8* 2.8*   Recent Labs  Lab 02/14/18 0841  LIPASE 46   CBC: Recent Labs  Lab 02/14/18 0841 02/15/18 0100 02/16/18 0558  WBC 3.5* 3.8* 3.3*  NEUTROABS 2.3  --   --   HGB 10.7* 10.9* 10.6*  HCT 33.7* 33.2* 32.3*  MCV 83.6 81.6 82.6  PLT 166 177 164   Blood Culture    Component Value Date/Time   SDES PLEURAL RIGHT 02/01/2018 0922   SDES PLEURAL RIGHT 02/01/2018 0922   SPECREQUEST NONE 02/01/2018 0922   SPECREQUEST NONE 02/01/2018 0922   CULT  02/01/2018 0922    NO GROWTH 5 DAYS Performed at [facility], [place] [address], [place], [place] [address]    REPTSTATUS 02/06/2018 FINAL 02/01/2018 0922   REPTSTATUS 02/02/2018 FINAL 02/01/2018 0922    Cardiac Enzymes: No results for  input(s): CKTOTAL, CKMB, CKMBINDEX, TROPONINI in the last 168 hours. CBG: Recent Labs  Lab 02/16/18 0829 02/17/18 0801 02/18/18 0732  GLUCAP 82 96 127*   Iron Studies: No results for input(s): IRON, TIBC, TRANSFERRIN, FERRITIN in the last 72 hours. Lab Results  Component Value Date   INR 1.56 09/21/2017   INR 1.28 09/04/2017   INR 1.13 09/03/2017   Studies/Results: No results found. Medications: . sodium chloride    . sodium chloride    . sodium chloride    . sodium chloride     . apixaban  5 mg Oral BID  . Chlorhexidine Gluconate Cloth  6 each Topical Q0600  . cinacalcet  90 mg Oral Q supper  . Darbepoetin Alfa  60 mcg Intravenous Q Wed-HD  . docusate sodium  100 mg Oral BID  . doxercalciferol  7 mcg Intravenous Q M,W,F-HD  . lisinopril  5 mg Oral Daily  . multivitamin  1 tablet Oral QHS  . pantoprazole  40 mg Oral Daily  . polyethylene glycol  17 g Oral Daily  . sevelamer carbonate  2,400 mg Oral TID WC  . sodium chloride flush  3 mL Intravenous Q12H

## 2018-02-18 NOTE — Progress Notes (Signed)
02/18/2018 10:03 AM   Spoke with Jerrye Beavers PA (Renal) who stated patient is clear from a HD standpoint.   Nicholes Calamity MD. New discharge orders written.   Patient has been educated by this Probation officer to discharge from hospital and report immediately to dialysis center [facility] by 12:15 HD chair time.   Patient verbalized understanding. Informed Charge Nurse and attending Nurse of updated plan of care.   Tuvia Woodrick MSN, RN-BC, CNML [facility] Phone: [phone]

## 2018-02-18 NOTE — Progress Notes (Signed)
Patient discharged to home. After visit summary reviewed and patient capable of re verbalizing medications and follow up appointments. Pt remains stable and is to report to his HD center this afternoon at 12:15 pm. No signs and symptoms of distress. Educated to return to ER in the event of SOB, dizziness, chest pain, or fainting. Mady Gemma, RN

## 2018-02-18 NOTE — Discharge Summary (Addendum)
Discharge Summary  Frank [name] PQA:[id number] DOB: Mar 22, 1966  PCP: Frank [name]  Admit date: 02/14/2018 Discharge date: 02/18/2018  Time spent: 25 minutes  Recommendations for Outpatient Follow-up:  1. Continue hemodialysis as planned 2. Follow-up with your PCP 3. Follow-up with your cardiologist 4. Take your medications as prescribed  Discharge Diagnoses:  Active Hospital Problems   Diagnosis Date Noted  . Right upper quadrant abdominal pain 12/01/2017  . Superficial venous thrombosis of arm, right 02/14/2018  . Pleural effusion, right 01/31/2018  . Hypoxemia 01/31/2018  . Pleural effusion on right 01/16/2018  . PE (pulmonary thromboembolism) ([facility]) 01/16/2018  . Benign hypertensive heart and kidney disease with systolic CHF, NYHA class 3 and CKD stage 5 ([facility])   . End stage renal disease on dialysis ([facility]) 05/26/2017  . DVT (deep venous thrombosis) ([facility]) 03/11/2017  . GERD (gastroesophageal reflux disease) 05/29/2016  . Constipation by delayed colonic transit 10/30/2015  . Left renal mass 10/30/2015  . Adjustment disorder with mixed anxiety and depressed mood 08/20/2015  . Essential hypertension 01/02/2015  . DM (diabetes mellitus), type 2, uncontrolled, with renal complications ([facility])   . Complex sleep apnea syndrome 05/05/2014  . Nausea & vomiting 06/24/2013  . Anemia of chronic kidney failure 06/24/2013    Resolved Hospital Problems  No resolved problems to display.    Discharge Condition: Stable  Diet recommendation: Resume previous diet  Vitals:   02/18/18 0509 02/18/18 0734  BP: (!) 152/123 (!) 145/100  Pulse: 87 79  Resp: 18 18  Temp: 98.4 F (36.9 C) (!) 97.3 F (36.3 C)  SpO2: 100% 96%    History of present illness:  This is the 13th hospital admission for the 52 yo male [name], since January 2019. Eight of those admissions have been to our facility; 5 other admissions documented in care everywhere in the epic system. Who presents to  our emergency department with complaints of nausea vomiting and right lower quadrant abdominal pain for 2 days and shortness of breath with right arm swelling.. Medical history significant for hypertension, diet-controlled diabetes requiring no insulin at recent admission, gastroesophageal reflux disease, abdominal pain likely IBS or pain from mass in left kidney, depression, anxiety, DVT with history of pulmonary embolism on Eliquis, congestive heart failure with an ejection fraction of 35%, end-stage renal disease on hemodialysis Monday Wednesday Friday with a chronically elevated troponin and a recent episode of pancreatitis with pseudocyst (treated here and at [facility] in the spring 2019) and chronic right-sided pleural effusion thought secondary to nonadherence to hemodialysis with total body volume overload resulting in right sided pleural effusion. CT scan 2 weeks ago showed chronic hepatic congestion.  Admitted for right upper quadrant abdominal pain.   Hospital course complicated by fluid overload due to noncompliance with hemodialysis and diet requiring multiple rounds of hemodialysis.  Reports lump under his right axilla which is improving.  Afebrile with no leukocytosis.  Hemodialysis 07/31/2109 without complication.  Scheduled to have another session of hemodialysis at his hemodialysis center today 02/18/2018 at 12 PM.  Patient made aware by nephrology and his RN.  02/18/2018: Patient seen and examined at his bedside.  He has no new complaints.  The day of discharge, patient was hemodynamically stable.  He will need to follow-up with his PCP, nephrology, cardiology, and keep his appointment to hemodialysis today 02/18/2018.   Hospital Course:  Principal Problem:   Right upper quadrant abdominal pain Active Problems:   Anemia of chronic kidney failure  Nausea & vomiting   DM (diabetes mellitus), type 2, uncontrolled, with renal complications ([facility])   Essential hypertension   Adjustment  disorder with mixed anxiety and depressed mood   Left renal mass   Constipation by delayed colonic transit   GERD (gastroesophageal reflux disease)   Complex sleep apnea syndrome   DVT (deep venous thrombosis) ([facility])   End stage renal disease on dialysis ([facility])   Benign hypertensive heart and kidney disease with systolic CHF, NYHA class 3 and CKD stage 5 ([facility])   Pleural effusion on right   PE (pulmonary thromboembolism) ([facility])   Hypoxemia   Pleural effusion, right   Superficial venous thrombosis of arm, right  Right upper quadrant abdominal pain, suspect 2/2 to fluid overload. HIDA scan unremarkable Treated symptomatically Avoid morphine and tramadol Pain management and bowel regimen in place  Fluid overload/acute pulmonary edema secondary to hemodialysis and diet noncompliance Requiring multiple rounds of hemodialysis Had hemodialysis 02/15/2018, 7/29, 7/30 Planned hemodialysis again on 02/18/2018 at his hemodialysis center.  Patient made aware. O2 sat 96% RA post HD  Intractable nausea and vomiting, resolved Suspect secondary to uremia Had hemodialysis 02/15/2018, 7/29, 7/30 Planned hemodialysis again on 02/18/2018 at his hemodialysis center.  Chronic sCHF Last 2D echo LVEF 35-40% on 11/10/17 with diffused hypokinesis Strict I&Os Daily weight Don't miss HD F/u with cardiology outpatient  History of recurrent pancreatitis Lipase unremarkable  Leukopenia, unclear etiology So sign of active infective process  Accelerated hypertension Continue home medications IV hydralazine as needed  Hyperkalemia in the setting of end-stage renal disease on dialysis, resolved  Superficial venous thrombosis affecting the right cephalic vein C/w eliquis  Right axillary swelling, improving ? hydranitis Afebrile with no leukocytosis  Hypervolemic hyponatremia, resolved Hyponatremia has resolved after dialysis  Anemia of chronic disease Most likely secondary to end-stage renal  disease  Hemoglobin stable No sign of overt bleeding  History of DVT on Eliquis Continue Eliquis  Goals of care Palliative care consulted and following  Medical noncompliance Counselling done at bedside on the importance of compliance with hemodialysis, follow-up appointments, and medications     Procedures:  HD sessions as stated above  Consultations:  Nephrology  Discharge Exam: BP (!) 145/100 (BP Location: Right Arm)   Pulse 79   Temp (!) 97.3 F (36.3 C) (Oral)   Resp 18   Ht _0  (1.88 m)   Wt 75 kg (165 lb 5.5 oz)   SpO2 96%   BMI 21.23 kg/m  . General: 52 y.o. year-old male well developed well nourished in no acute distress.  Alert and oriented x3. . Cardiovascular: Regular rate and rhythm with no rubs or gallops.  No thyromegaly or JVD noted.   [name] Respiratory: Clear to auscultation with no wheezes or rales. Good inspiratory effort. . Abdomen: Soft nontender nondistended with normal bowel sounds x4 quadrants. . Musculoskeletal: No lower extremity edema. 2/4 pulses in all 4 extremities. . Skin: No ulcerative lesions noted or rashes, . Psychiatry: Mood is appropriate for condition and setting  Discharge Instructions You were cared for by a hospitalist during your hospital stay. If you have any questions about your discharge medications or the care you received while you were in the hospital after you are discharged, you can call the unit and asked to speak with the hospitalist on call if the hospitalist that took care of you is not available. Once you are discharged, your primary care physician will handle any further medical issues. Please note that NO REFILLS for  any discharge medications will be authorized once you are discharged, as it is imperative that you return to your primary care physician (or establish a relationship with a primary care physician if you do not have one) for your aftercare needs so that they can reassess your need for medications and  monitor your lab values.   Allergies as of 02/18/2018      Reactions   Butalbital-apap-caffeine Shortness Of Breath, Swelling, Other (See Comments)   Swelling in throat   Ferrlecit [na Ferric Gluc Cplx In Sucrose] Shortness Of Breath, Swelling, Other (See Comments)   Swelling in throat, tolerates Venofor   Minoxidil Shortness Of Breath   Tylenol [acetaminophen] Anaphylaxis, Swelling   Darvocet [propoxyphene N-acetaminophen] Hives      Medication List    STOP taking these medications   lanthanum 1000 MG chewable tablet Commonly known as:  FOSRENOL   ondansetron 8 MG disintegrating tablet Commonly known as:  ZOFRAN-ODT     TAKE these medications   ALPRAZolam 0.5 MG tablet Commonly known as:  XANAX Take 0.5 tablets (0.25 mg total) by mouth 2 (two) times daily as needed for anxiety.   ammonium lactate 12 % lotion Commonly known as:  LAC-HYDRIN Apply topically as needed for dry skin.   apixaban 5 MG Tabs tablet Commonly known as:  ELIQUIS Take 1 tablet (5 mg total) by mouth 2 (two) times daily.   cinacalcet 90 MG tablet Commonly known as:  SENSIPAR Take 90 mg by mouth daily with supper.   Darbepoetin Alfa 60 MCG/0.3ML Sosy injection Commonly known as:  ARANESP Inject 0.3 mLs (60 mcg total) into the vein every Wednesday with hemodialysis.   diclofenac sodium 1 % Gel Commonly known as:  VOLTAREN Apply 2 g topically 4 (four) times daily as needed. What changed:  reasons to take this   docusate sodium 100 MG capsule Commonly known as:  COLACE Take 1 capsule (100 mg total) by mouth 2 (two) times daily. What changed:    when to take this  reasons to take this   lisinopril 5 MG tablet Commonly known as:  PRINIVIL,ZESTRIL Take 1 tablet (5 mg total) by mouth daily.   multivitamin Tabs tablet Take 1 tablet by mouth at bedtime.   ondansetron 4 MG tablet Commonly known as:  ZOFRAN Take 1 tablet (4 mg total) by mouth daily as needed for nausea or vomiting.     oxyCODONE-acetaminophen 5-325 MG tablet Commonly known as:  PERCOCET/ROXICET Take 1 tablet by mouth 2 (two) times daily as needed for moderate pain or severe pain. What changed:    when to take this  reasons to take this   pantoprazole 40 MG tablet Commonly known as:  PROTONIX Take 1 tablet (40 mg total) by mouth daily.   polyethylene glycol packet Commonly known as:  MIRALAX / GLYCOLAX Take 17 g by mouth daily. What changed:    when to take this  reasons to take this   sevelamer carbonate 800 MG tablet Commonly known as:  RENVELA Take 3 tablets (2,400 mg total) by mouth 3 (three) times daily with meals.      Allergies  Allergen Reactions  . Butalbital-Apap-Caffeine Shortness Of Breath, Swelling and Other (See Comments)    Swelling in throat  . Ferrlecit [Na Ferric Gluc Cplx In Sucrose] Shortness Of Breath, Swelling and Other (See Comments)    Swelling in throat, tolerates Venofor  . Minoxidil Shortness Of Breath  . Tylenol [Acetaminophen] Anaphylaxis and Swelling  . Darvocet [Propoxyphene N-Acetaminophen]  Hives   Follow-up Information    Lorretta Harp, MD. Call in 1 day(s).   Specialties:  Cardiology, Radiology Why:  Please call for an appointment. Contact information: [address] [place] [address] [phone]            The results of significant diagnostics from this hospitalization (including imaging, microbiology, ancillary and laboratory) are listed below for reference.    Significant Diagnostic Studies: Ct Abdomen Pelvis Wo Contrast  Result Date: 01/31/2018 CLINICAL DATA:  Nausea and vomiting. EXAM: CT CHEST, ABDOMEN AND PELVIS WITHOUT CONTRAST TECHNIQUE: Multidetector CT imaging of the chest, abdomen and pelvis was performed following the standard protocol without IV contrast. COMPARISON:  Chest and abdominal x-rays from same day. CT abdomen pelvis dated January 25, 2018. CT chest dated November 09, 2017. FINDINGS: CT CHEST  FINDINGS Cardiovascular: Unchanged moderate cardiomegaly and small pericardial effusion. Normal caliber thoracic aorta. Coronary, aortic arch, and branch vessel atherosclerotic vascular disease. Unchanged mildly enlarged main pulmonary artery measuring up to 3.4 cm. Mediastinum/Nodes: Mildly enlarged mediastinal lymph nodes appears stable to slightly decreased in size. No axillary lymphadenopathy. The thyroid gland, trachea, and esophagus demonstrate no significant findings. Lungs/Pleura: Large right pleural effusion. Faint layering ground-glass density in both upper lobes likely reflects mild pulmonary edema. Increased atelectasis in the right upper, middle, and lower lobes. Mild peribronchial thickening. No pneumothorax. No suspicious pulmonary nodule. Musculoskeletal: Diffuse sclerosis, consistent with renal osteodystrophy. No acute or significant osseous findings. Old fracture deformity of the right proximal clavicle. CT ABDOMEN PELVIS FINDINGS Hepatobiliary: No focal liver abnormality is seen. No gallstones, gallbladder wall thickening, or biliary dilatation. Pancreas: Unremarkable. No pancreatic ductal dilatation or surrounding inflammatory changes. Spleen: Normal in size without focal abnormality. Adrenals/Urinary Tract: The adrenal glands are unremarkable. Bilateral renal atrophy again noted. Unchanged 3.3 cm thick-walled complex lesion arising from the left kidney. Unchanged small bilateral simple cysts. No renal or ureteral calculi. No hydronephrosis. 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 The bladder is unremarkable for the degree of distention. Stomach/Bowel: Stomach is within normal limits. Appendix not definitively visualized, however there are no signs of inflammation at the base of the cecum. No evidence of bowel wall thickening, distention, or inflammatory changes. Vascular/Lymphatic: Aortic atherosclerosis. No enlarged abdominal or pelvic lymph nodes. Reproductive:  Prostate is unremarkable. Other: No free fluid or pneumoperitoneum. Musculoskeletal: Diffuse sclerosis. No acute or significant osseous findings. IMPRESSION: Chest: 1. Large right pleural effusion with compressive right lung atelectasis, greatest in the right lower lobe. 2. Unchanged moderate cardiomegaly and small pericardial effusion. Faint layering ground-glass density in both upper lobes likely reflects mild pulmonary edema. 3. Unchanged mildly enlarged main pulmonary artery, consistent with pulmonary arterial hypertension. 4. Stable to slight interval decrease in size of mildly enlarged mediastinal lymph nodes, favored reactive. 5.  Aortic atherosclerosis (ICD10-I70.0). Abdomen and pelvis: 1.  No acute intra-abdominal process. 2. Unchanged 3.3 cm indeterminate left renal mass. Electronically Signed   By: Titus Dubin M.D.   On: 01/31/2018 13:59   Ct Abdomen Pelvis Wo Contrast  Result Date: 01/25/2018 CLINICAL DATA:  Abdominal pain and emesis. Recent small-bowel obstruction. Renal failure EXAM: CT ABDOMEN AND PELVIS WITHOUT CONTRAST TECHNIQUE: Multidetector CT imaging of the abdomen and pelvis was performed following the standard protocol without oral or IV contrast. COMPARISON:  Multiple prior CT examinations, most recent January 16, 2018 FINDINGS: Lower chest: There is a sizable pleural effusion on the right with consolidation in the right base posteriorly. Visualized left lung  base shows mild atelectasis. There is cardiomegaly with foci of coronary artery calcification. There is a small pericardial effusion. Hepatobiliary: No focal liver lesions are evident on this noncontrast enhanced study. Gallbladder wall is not appreciably thickened. There is no biliary duct dilatation. Pancreas: No pancreatic mass or inflammatory focus is evident. Spleen: No splenic lesions are evident. Adrenals/Urinary Tract: Adrenals appear normal bilaterally. Native kidneys are atrophic bilaterally. There is a 2.1 x 2.1 cm cyst  arising from the upper pole left kidney. There is a thick-walled complex mass arising from the lateral left kidney measuring 3.3 x 3.1 cm. There is no evident hydronephrosis on either side. In the left anterior pelvis, there is a stable heavily calcified ovoid mass consistent with failed transplant kidney. There is no appreciable recognizable renal tissue in this area. There is no evident renal or ureteral calculus on either side. Urinary bladder is midline with mild wall thickening. Stomach/Bowel: Most bowel loops are fluid filled. There is no frank bowel wall thickening or transition zone to suggest bowel obstruction. No free air or portal venous air is evident. Vascular/Lymphatic: There is extensive aortoiliac atherosclerosis. There is atherosclerotic calcification throughout major mesenteric and pelvic arterial vessels. No mesenteric arterial obstruction is seen on this noncontrast enhanced study. There is no frank aneurysm. There is no appreciable adenopathy in the abdomen or pelvis. Reproductive: Prostate and seminal vesicles appear normal. There is extensive seminal vesicle calcification. There is calcification in the arteries of the penile and scrotal regions. Other: There is slight ascites in the abdomen and pelvis. Appendix region appears unremarkable. No abscess is evident in the abdomen or pelvis. Musculoskeletal: The bones show diffuse sclerosis consistent with renal osteodystrophy. No destructive lesions are evident. There are Schmorl's nodes at several levels in the lower thoracic and lumbar regions. No intramuscular lesions are evident. IMPRESSION: 1. Evidence of renal failure with atrophic native kidneys bilaterally. Failed transplant kidney with extensive calcification noted in the left anterior pelvis. 2. Indeterminate mass arising from lateral mid left kidney measuring 3.3 x 3.1 cm. A renal neoplasm in this area cannot be excluded. Note that patients with chronic renal failure have increased risk  for renal cell carcinoma development. This finding may warrant nonemergent abdominal MR for further assessment. 3. There is fluid throughout most of the bowel, likely due to a degree of ileus or enteritis. No frank bowel obstruction evident. 4. Sizable right pleural effusion with consolidation/atelectasis right base. 5. Extensive atherosclerotic calcification throughout the aorta as well as major pelvic and mesenteric arterial vessels. No frank aneurysm. There are multiple foci of coronary artery calcification. 6.  Cardiomegaly with small pericardial effusion. 7. Mild ascites. No abscess in the abdomen or pelvis. No periappendiceal region inflammation. 8.  Bones show diffuse changes of renal osteodystrophy. Aortic Atherosclerosis (ICD10-I70.0). Electronically Signed   By: Lowella Grip III M.D.   On: 01/25/2018 13:04   Dg Chest 1 View  Result Date: 01/25/2018 CLINICAL DATA:  Shortness of breath.  Renal failure. EXAM: CHEST  1 VIEW COMPARISON:  January 19, 2018 and January 05, 2018 FINDINGS: There is a persistent right pleural effusion. There is patchy consolidation in the right base. There is fine reticular interstitial disease throughout the lungs bilaterally, stable. Heart is enlarged with pulmonary vascularity normal. No adenopathy. No evident bone lesions. IMPRESSION: Fairly small but stable pleural effusion on the right with consolidation in a portion of the right base. Underlying diffuse reticular interstitial disease. Stable cardiomegaly. No adenopathy evident. Electronically Signed   By: Gwyndolyn Saxon  Jasmine December III M.D.   On: 01/25/2018 12:13   Dg Chest 1 View  Result Date: 01/19/2018 CLINICAL DATA:  Status post right thoracentesis EXAM: CHEST  1 VIEW COMPARISON:  01/16/2018 FINDINGS: Previously seen right pleural effusion has reduced significantly following thoracentesis. No pneumothorax is noted. Cardiac shadow remains enlarged. No focal infiltrate is seen. Mild central vascular congestion is noted without  edema. IMPRESSION: Status post right thoracentesis without pneumothorax. Electronically Signed   By: Inez Catalina M.D.   On: 01/19/2018 12:02   Dg Chest 2 View  Result Date: 02/14/2018 CLINICAL DATA:  Cough and shortness of breath EXAM: CHEST - 2 VIEW COMPARISON:  02/11/2018 FINDINGS: Cardiac shadow remains enlarged. Persistent right basilar infiltrate and effusion are seen stable in appearance from the prior exam. Left lung remains clear. No new focal abnormality is seen. IMPRESSION: Stable right basilar infiltrate and effusion. Electronically Signed   By: Inez Catalina M.D.   On: 02/14/2018 08:18   Dg Chest 2 View  Result Date: 02/11/2018 CLINICAL DATA:  Shortness of breath EXAM: CHEST - 2 VIEW COMPARISON:  02/09/2018 FINDINGS: Medium-sized right pleural effusion, unchanged. Cardiomegaly is also unchanged. Consolidation or atelectasis of the right lung remains. No pneumothorax. IMPRESSION: Unchanged medium-sized right pleural effusion with associated right basilar atelectasis or consolidation. Electronically Signed   By: Ulyses Jarred M.D.   On: 02/11/2018 01:05   Dg Chest 2 View  Result Date: 02/09/2018 CLINICAL DATA:  Chest pain.  The patient came from dialysis. EXAM: CHEST - 2 VIEW COMPARISON:  PA and lateral chest x-ray of February 04, 2018 FINDINGS: There remains a moderate-sized right pleural effusion. There is no left pleural effusion. The interstitial markings of both lungs are mildly increased. The cardiac silhouette is enlarged and the pulmonary vascularity mildly engorged. There is calcification in the wall of the aortic arch. The bony thorax is unremarkable. IMPRESSION: Moderate-sized right pleural effusion little changed from the earlier study. One cannot exclude right basilar atelectasis or pneumonia. Mild CHF. Electronically Signed   By: David  Martinique M.D.   On: 02/09/2018 09:38   Dg Chest 2 View  Result Date: 02/04/2018 CLINICAL DATA:  52 year old male with a history of pleural effusion  EXAM: CHEST - 2 VIEW COMPARISON:  02/01/2018 FINDINGS: Cardiomediastinal silhouette likely unchanged, partially obscured by overlying lung and pleural disease. Persisting cardiomegaly. Interval enlargement of right-sided pleural fluid with obscuration the right hemidiaphragm and the right heart border. No pneumothorax. No significant interlobular septal thickening. IMPRESSION: Interval enlargement of right-sided pleural effusion with associated atelectasis/consolidation. Cardiomegaly Electronically Signed   By: Corrie Mckusick D.O.   On: 02/04/2018 20:32   X-ray Chest Pa And Lateral  Result Date: 02/01/2018 CLINICAL DATA:  52 year old male with recurrent right-sided pleural effusion now status post repeat right thoracentesis. EXAM: CHEST - 2 VIEW COMPARISON:  Most recent prior chest x-ray 01/31/2018 FINDINGS: No evidence of pneumothorax following right-sided thoracentesis. Significantly reduced right-sided pleural effusion. Minimal residual pleural fluid and atelectasis. Stable cardiomegaly. Atherosclerotic calcifications are again noted in the transverse aorta. Mild vascular congestion without overt edema. No acute osseous abnormality. IMPRESSION: Status post right-sided thoracentesis without evidence of pneumothorax. Electronically Signed   By: Jacqulynn Cadet M.D.   On: 02/01/2018 10:00   Ct Chest Wo Contrast  Result Date: 01/31/2018 CLINICAL DATA:  Nausea and vomiting. EXAM: CT CHEST, ABDOMEN AND PELVIS WITHOUT CONTRAST TECHNIQUE: Multidetector CT imaging of the chest, abdomen and pelvis was performed following the standard protocol without IV contrast. COMPARISON:  Chest and abdominal  x-rays from same day. CT abdomen pelvis dated January 25, 2018. CT chest dated November 09, 2017. FINDINGS: CT CHEST FINDINGS Cardiovascular: Unchanged moderate cardiomegaly and small pericardial effusion. Normal caliber thoracic aorta. Coronary, aortic arch, and branch vessel atherosclerotic vascular disease. Unchanged mildly  enlarged main pulmonary artery measuring up to 3.4 cm. Mediastinum/Nodes: Mildly enlarged mediastinal lymph nodes appears stable to slightly decreased in size. No axillary lymphadenopathy. The thyroid gland, trachea, and esophagus demonstrate no significant findings. Lungs/Pleura: Large right pleural effusion. Faint layering ground-glass density in both upper lobes likely reflects mild pulmonary edema. Increased atelectasis in the right upper, middle, and lower lobes. Mild peribronchial thickening. No pneumothorax. No suspicious pulmonary nodule. Musculoskeletal: Diffuse sclerosis, consistent with renal osteodystrophy. No acute or significant osseous findings. Old fracture deformity of the right proximal clavicle. CT ABDOMEN PELVIS FINDINGS Hepatobiliary: No focal liver abnormality is seen. No gallstones, gallbladder wall thickening, or biliary dilatation. Pancreas: Unremarkable. No pancreatic ductal dilatation or surrounding inflammatory changes. Spleen: Normal in size without focal abnormality. Adrenals/Urinary Tract: The adrenal glands are unremarkable. Bilateral renal atrophy again noted. Unchanged 3.3 cm thick-walled complex lesion arising from the left kidney. Unchanged small bilateral simple cysts. No renal or ureteral calculi. No hydronephrosis. 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 The bladder is unremarkable for the degree of distention. Stomach/Bowel: Stomach is within normal limits. Appendix not definitively visualized, however there are no signs of inflammation at the base of the cecum. No evidence of bowel wall thickening, distention, or inflammatory changes. Vascular/Lymphatic: Aortic atherosclerosis. No enlarged abdominal or pelvic lymph nodes. Reproductive: Prostate is unremarkable. Other: No free fluid or pneumoperitoneum. Musculoskeletal: Diffuse sclerosis. No acute or significant osseous findings. IMPRESSION: Chest: 1. Large right pleural effusion with  compressive right lung atelectasis, greatest in the right lower lobe. 2. Unchanged moderate cardiomegaly and small pericardial effusion. Faint layering ground-glass density in both upper lobes likely reflects mild pulmonary edema. 3. Unchanged mildly enlarged main pulmonary artery, consistent with pulmonary arterial hypertension. 4. Stable to slight interval decrease in size of mildly enlarged mediastinal lymph nodes, favored reactive. 5.  Aortic atherosclerosis (ICD10-I70.0). Abdomen and pelvis: 1.  No acute intra-abdominal process. 2. Unchanged 3.3 cm indeterminate left renal mass. Electronically Signed   By: Titus Dubin M.D.   On: 01/31/2018 13:59   Nm Hepatobiliary Liver Func  Result Date: 02/15/2018 CLINICAL DATA:  Inpatient.  Right upper quadrant abdominal pain. EXAM: NUCLEAR MEDICINE HEPATOBILIARY IMAGING TECHNIQUE: Sequential images of the abdomen were obtained out to 60 minutes following intravenous administration of radiopharmaceutical. RADIOPHARMACEUTICALS:  5.3 mCi Tc-18m Choletec IV COMPARISON:  02/04/2018 CT abdomen/pelvis. 02/14/2018 abdominal sonogram. FINDINGS: Prompt uptake and biliary excretion of activity by the liver is seen. Gallbladder activity is visualized, consistent with patency of cystic duct. Biliary activity passes into small bowel, consistent with patent common bile duct. IMPRESSION: Normal hepatobiliary scintigraphy study. Patent cystic duct, which is not compatible with acute cholecystitis. Patent common bile duct. Electronically Signed   By: JIlona SorrelM.D.   On: 02/15/2018 10:24   Ct Abdomen Pelvis W Contrast  Result Date: 02/04/2018 CLINICAL DATA:  High-grade bowel obstruction EXAM: CT ABDOMEN AND PELVIS WITH CONTRAST TECHNIQUE: Multidetector CT imaging of the abdomen and pelvis was performed using the standard protocol following bolus administration of intravenous contrast. CONTRAST:  1026mOMNIPAQUE IOHEXOL 300 MG/ML  SOLN COMPARISON:  01/31/2018 FINDINGS: Lower  chest: Moderate right pleural effusion. Right lower lobe opacity, likely compressive atelectasis. Cardiomegaly. Moderate pericardial effusion. Hepatobiliary: Macronodular hepatic contour  with suspected hepatic congestion. Gallbladder is unremarkable. No intrahepatic or extrahepatic ductal dilatation. Pancreas: Within normal limits. Spleen: Within normal limits. Adrenals/Urinary Tract: Adrenal glands are within normal limits. Bilateral native renal cortical scarring/atrophy. Left renal cysts, including a 2.1 cm left upper pole renal cyst with mild peripheral complexity, poorly evaluated. Additional 3.3 cm lateral left upper pole cystic/solid mass, indeterminate. No hydronephrosis. Failed transplant kidney in the left lower quadrant (series 3/image 58). Bladder is underdistended but unremarkable. Stomach/Bowel: Stomach is within normal limits. No evidence of bowel obstruction. No colonic wall thickening or inflammatory changes. Vascular/Lymphatic: No evidence of abdominal aortic aneurysm. Atherosclerotic calcifications of the abdominal aorta and branch vessels. No suspicious abdominopelvic lymphadenopathy. Reproductive: Prostate is unremarkable. Other: No abdominopelvic ascites. Musculoskeletal: Diffuse osseous sclerosis, compatible with renal osteodystrophy. IMPRESSION: No evidence of bowel obstruction. 3.3 cm left upper pole renal mass, indeterminate. Native renal atrophy. Failed left lower quadrant renal transplant kidney. Additional ancillary findings as above. Electronically Signed   By: Julian Hy M.D.   On: 02/04/2018 18:53   Dg Abdomen Acute W/chest  Result Date: 01/31/2018 CLINICAL DATA:  Vomiting. EXAM: DG ABDOMEN ACUTE W/ 1V CHEST COMPARISON:  01/28/2018 FINDINGS: Chest x-ray shows some increase in moderate right pleural effusion with associated atelectasis and consolidation of the right lower lung. Component of pneumonia cannot be excluded. Mild interstitial edema appears similar to the prior  study. Abdominal films show no bowel obstruction or ileus. No free air identified. No abnormal calcifications. Bony structures are unremarkable. IMPRESSION: Increase in volume of moderate right pleural effusion with associated atelectasis and consolidation of the right lower lung. Underlying pneumonia cannot be excluded. Mild pulmonary interstitial edema appears similar to the prior study and is likely chronic. Electronically Signed   By: Aletta Edouard M.D.   On: 01/31/2018 10:58   Dg Abd Acute W/chest  Result Date: 01/28/2018 CLINICAL DATA:  Abdominal pain, onset yesterday. Nausea and vomiting. EXAM: DG ABDOMEN ACUTE W/ 1V CHEST COMPARISON:  Multiple prior exams. Most recent chest radiograph and abdominal CT 01/25/2018 FINDINGS: Moderate right pleural effusion with adjacent atelectasis. Unchanged cardiomegaly. There are aortic atherosclerotic calcifications. No pulmonary edema. No bowel dilatation to suggest obstruction. Generalized paucity of bowel gas in the abdomen and pelvis. Left lower quadrant calcifications correspond failed renal transplant. Additionally there are vascular calcifications. Unchanged osseous structures. IMPRESSION: 1. No bowel obstruction or free air. 2. Moderate right pleural effusion with adjacent atelectasis, unchanged from recent exams. Electronically Signed   By: Jeb Levering M.D.   On: 01/28/2018 04:41   US Abdomen Limited Ruq  Result Date: 02/14/2018 CLINICAL DATA:  Right upper quadrant abdominal pain. EXAM: ULTRASOUND ABDOMEN LIMITED RIGHT UPPER QUADRANT COMPARISON:  Body CT 02/04/2018 FINDINGS: Gallbladder: There is mild gallbladder wall thickening of up to 4 mm. No gallstones are seen. The sonographic Murphy's sign is recorded as negative. Common bile duct: Diameter: 5 mm Liver: No focal lesion identified. Within normal limits in parenchymal echogenicity. Portal vein is patent on color Doppler imaging with normal direction of blood flow towards the liver. Atrophic  right kidney noted. Small right pleural effusion. IMPRESSION: Mild gallbladder wall thickening without evidence of cholelithiasis. Small right pleural effusion. Electronically Signed   By: Fidela Salisbury M.D.   On: 02/14/2018 08:13   Ir Thoracentesis Asp Pleural Space W/img Guide  Result Date: 01/19/2018 INDICATION: Patient with history of shortness of breath and recurrent right-sided pleural effusion. Request is made for therapeutic right thoracentesis. EXAM: ULTRASOUND GUIDED THERAPEUTIC RIGHT THORACENTESIS MEDICATIONS: 10 mL  of 2% lidocaine COMPLICATIONS: None immediate. PROCEDURE: An ultrasound guided thoracentesis was thoroughly discussed with the patient and questions answered. The benefits, risks, alternatives and complications were also discussed. The patient understands and wishes to proceed with the procedure. Written consent was obtained. Ultrasound was performed to localize and mark an adequate pocket of fluid in the right chest. The area was then prepped and draped in the normal sterile fashion. 2% Lidocaine was used for local anesthesia. Under ultrasound guidance a 6 Fr Safe-T-Centesis catheter was introduced. Thoracentesis was performed. The catheter was removed and a dressing applied. FINDINGS: A total of approximately 1.5 L of clear gold fluid was removed. IMPRESSION: Successful ultrasound guided right thoracentesis yielding 1.5 L of pleural fluid. Read by: Earley Abide, PA-C Electronically Signed   By: Sandi Mariscal M.D.   On: 01/19/2018 12:24   US Thoracentesis Asp Pleural Space W/img Guide  Result Date: 02/01/2018 INDICATION: End-stage renal disease on hemodialysis. Shortness of breath. Right-sided pleural effusion. Request for diagnostic and therapeutic thoracentesis. EXAM: ULTRASOUND GUIDED RIGHT THORACENTESIS MEDICATIONS: None. COMPLICATIONS: None immediate. PROCEDURE: An ultrasound guided thoracentesis was thoroughly discussed with the patient and questions answered. The benefits,  risks, alternatives and complications were also discussed. The patient understands and wishes to proceed with the procedure. Written consent was obtained. Ultrasound was performed to localize and mark an adequate pocket of fluid in the right chest. The area was then prepped and draped in the normal sterile fashion. 1% Lidocaine was used for local anesthesia. Under ultrasound guidance a 6 Fr Safe-T-Centesis catheter was introduced. Thoracentesis was performed. The catheter was removed and a dressing applied. FINDINGS: A total of approximately 1.4 L of clear yellow fluid was removed. Samples were sent to the laboratory as requested by the clinical team. IMPRESSION: Successful ultrasound guided right thoracentesis yielding 1.4 L of pleural fluid. Read by: Ascencion Dike PA-C Electronically Signed   By: Jacqulynn Cadet M.D.   On: 02/01/2018 10:33    Microbiology: Recent Results (from the past 240 hour(s))  MRSA PCR Screening     Status: None   Collection Time: 02/14/18  6:34 PM  Result Value Ref Range Status   MRSA by PCR NEGATIVE NEGATIVE Final    Comment:        The GeneXpert MRSA Assay (FDA approved for NASAL specimens only), is one component of a comprehensive MRSA colonization surveillance program. It is not intended to diagnose MRSA infection nor to guide or monitor treatment for MRSA infections. Performed at [facility], [place] [address], [place], [place] [address]      Labs: Basic Metabolic Panel: Recent Labs  Lab 02/14/18 0841 02/15/18 0100 02/16/18 0558  NA 135 133* 137  K 4.9 5.4* 4.9  CL 98 97* 99  CO2 22 20* 23  GLUCOSE 96 79 76  BUN 63* 71* 46*  CREATININE 13.51* 14.12* 10.70*  CALCIUM 8.7* 8.7* 7.6*  PHOS  --  9.7*  --    Liver Function Tests: Recent Labs  Lab 02/14/18 0841 02/15/18 0100  AST 37  --   ALT 16  --   ALKPHOS 109  --   BILITOT 1.0  --   PROT 7.8  --   ALBUMIN 2.8* 2.8*   Recent Labs  Lab 02/14/18 0841  LIPASE 46   No results for  input(s): AMMONIA in the last 168 hours. CBC: Recent Labs  Lab 02/14/18 0841 02/15/18 0100 02/16/18 0558  WBC 3.5* 3.8* 3.3*  NEUTROABS 2.3  --   --  HGB 10.7* 10.9* 10.6*  HCT 33.7* 33.2* 32.3*  MCV 83.6 81.6 82.6  PLT 166 177 164   Cardiac Enzymes: No results for input(s): CKTOTAL, CKMB, CKMBINDEX, TROPONINI in the last 168 hours. BNP: BNP (last 3 results) Recent Labs    06/13/17 0126 12/01/17 1101 02/09/18 1049  BNP >4,500.0* >4,500.0* >4,500.0*    ProBNP (last 3 results) No results for input(s): PROBNP in the last 8760 hours.  CBG: Recent Labs  Lab 02/16/18 0829 02/17/18 0801 02/18/18 0732  GLUCAP 82 96 127*       Signed:  Kayleen Memos, MD [facility] 02/18/2018, 10:27 AM

## 2018-02-19 ENCOUNTER — Emergency Department (HOSPITAL_COMMUNITY): Payer: Medicare Other

## 2018-02-19 ENCOUNTER — Other Ambulatory Visit: Payer: Self-pay

## 2018-02-19 ENCOUNTER — Non-Acute Institutional Stay (HOSPITAL_COMMUNITY)
Admission: EM | Admit: 2018-02-19 | Discharge: 2018-02-19 | Disposition: A | Discharge status: Home / Self Care | Payer: Medicare Other | Attending: Emergency Medicine | Admitting: Emergency Medicine

## 2018-02-19 ENCOUNTER — Encounter (HOSPITAL_COMMUNITY): Payer: Self-pay | Admitting: Emergency Medicine

## 2018-02-19 DIAGNOSIS — I5042 Chronic combined systolic (congestive) and diastolic (congestive) heart failure: Secondary | ICD-10-CM | POA: Diagnosis not present

## 2018-02-19 DIAGNOSIS — I429 Cardiomyopathy, unspecified: Secondary | ICD-10-CM | POA: Insufficient documentation

## 2018-02-19 DIAGNOSIS — Z86718 Personal history of other venous thrombosis and embolism: Secondary | ICD-10-CM | POA: Insufficient documentation

## 2018-02-19 DIAGNOSIS — R1011 Right upper quadrant pain: Secondary | ICD-10-CM | POA: Diagnosis not present

## 2018-02-19 DIAGNOSIS — Z87891 Personal history of nicotine dependence: Secondary | ICD-10-CM | POA: Insufficient documentation

## 2018-02-19 DIAGNOSIS — E785 Hyperlipidemia, unspecified: Secondary | ICD-10-CM | POA: Insufficient documentation

## 2018-02-19 DIAGNOSIS — Z7901 Long term (current) use of anticoagulants: Secondary | ICD-10-CM | POA: Diagnosis not present

## 2018-02-19 DIAGNOSIS — G8929 Other chronic pain: Secondary | ICD-10-CM | POA: Diagnosis not present

## 2018-02-19 DIAGNOSIS — Z885 Allergy status to narcotic agent status: Secondary | ICD-10-CM | POA: Insufficient documentation

## 2018-02-19 DIAGNOSIS — I132 Hypertensive heart and chronic kidney disease with heart failure and with stage 5 chronic kidney disease, or end stage renal disease: Secondary | ICD-10-CM | POA: Insufficient documentation

## 2018-02-19 DIAGNOSIS — J90 Pleural effusion, not elsewhere classified: Secondary | ICD-10-CM | POA: Diagnosis not present

## 2018-02-19 DIAGNOSIS — E861 Hypovolemia: Secondary | ICD-10-CM | POA: Insufficient documentation

## 2018-02-19 DIAGNOSIS — N186 End stage renal disease: Secondary | ICD-10-CM | POA: Insufficient documentation

## 2018-02-19 DIAGNOSIS — K7460 Unspecified cirrhosis of liver: Secondary | ICD-10-CM | POA: Insufficient documentation

## 2018-02-19 DIAGNOSIS — E1122 Type 2 diabetes mellitus with diabetic chronic kidney disease: Secondary | ICD-10-CM | POA: Diagnosis not present

## 2018-02-19 DIAGNOSIS — I2510 Atherosclerotic heart disease of native coronary artery without angina pectoris: Secondary | ICD-10-CM | POA: Insufficient documentation

## 2018-02-19 DIAGNOSIS — Z992 Dependence on renal dialysis: Secondary | ICD-10-CM | POA: Diagnosis not present

## 2018-02-19 DIAGNOSIS — K219 Gastro-esophageal reflux disease without esophagitis: Secondary | ICD-10-CM | POA: Diagnosis not present

## 2018-02-19 DIAGNOSIS — F418 Other specified anxiety disorders: Secondary | ICD-10-CM | POA: Insufficient documentation

## 2018-02-19 DIAGNOSIS — E8770 Fluid overload, unspecified: Secondary | ICD-10-CM | POA: Diagnosis not present

## 2018-02-19 DIAGNOSIS — Z886 Allergy status to analgesic agent status: Secondary | ICD-10-CM | POA: Insufficient documentation

## 2018-02-19 DIAGNOSIS — Z79899 Other long term (current) drug therapy: Secondary | ICD-10-CM | POA: Insufficient documentation

## 2018-02-19 DIAGNOSIS — T8610 Unspecified complication of kidney transplant: Secondary | ICD-10-CM | POA: Diagnosis not present

## 2018-02-19 DIAGNOSIS — N289 Disorder of kidney and ureter, unspecified: Secondary | ICD-10-CM

## 2018-02-19 DIAGNOSIS — R0602 Shortness of breath: Secondary | ICD-10-CM | POA: Diagnosis not present

## 2018-02-19 LAB — COMPREHENSIVE METABOLIC PANEL
ALBUMIN: 3.1 g/dL — AB (ref 3.5–5.0)
ALT: 18 U/L (ref 0–44)
AST: 38 U/L (ref 15–41)
Alkaline Phosphatase: 139 U/L — ABNORMAL HIGH (ref 38–126)
Anion gap: 13 (ref 5–15)
BUN: 64 mg/dL — AB (ref 6–20)
CHLORIDE: 100 mmol/L (ref 98–111)
CO2: 24 mmol/L (ref 22–32)
Calcium: 8.3 mg/dL — ABNORMAL LOW (ref 8.9–10.3)
Creatinine, Ser: 10.36 mg/dL — ABNORMAL HIGH (ref 0.61–1.24)
GFR calc Af Amer: 6 mL/min — ABNORMAL LOW (ref >60)
GFR, EST NON AFRICAN AMERICAN: 5 mL/min — AB (ref >60)
Glucose, Bld: 88 mg/dL (ref 70–99)
POTASSIUM: 5.9 mmol/L — AB (ref 3.5–5.1)
SODIUM: 137 mmol/L (ref 135–145)
Total Bilirubin: 1.1 mg/dL (ref 0.3–1.2)
Total Protein: 7.6 g/dL (ref 6.5–8.1)

## 2018-02-19 LAB — CBC WITH DIFFERENTIAL/PLATELET
Abs Immature Granulocytes: 0 10*3/uL (ref 0.0–0.1)
BASOS ABS: 0 10*3/uL (ref 0.0–0.1)
BASOS PCT: 1 %
Eosinophils Absolute: 0.2 10*3/uL (ref 0.0–0.7)
Eosinophils Relative: 5 %
HCT: 35.3 % — ABNORMAL LOW (ref 39.0–52.0)
Hemoglobin: 10.9 g/dL — ABNORMAL LOW (ref 13.0–17.0)
IMMATURE GRANULOCYTES: 1 %
Lymphocytes Relative: 11 %
Lymphs Abs: 0.5 10*3/uL — ABNORMAL LOW (ref 0.7–4.0)
MCH: 26.3 pg (ref 26.0–34.0)
MCHC: 30.9 g/dL (ref 30.0–36.0)
MCV: 85.3 fL (ref 78.0–100.0)
Monocytes Absolute: 0.5 10*3/uL (ref 0.1–1.0)
Monocytes Relative: 10 %
NEUTROS ABS: 3.4 10*3/uL (ref 1.7–7.7)
NEUTROS PCT: 72 %
PLATELETS: 168 10*3/uL (ref 150–400)
RBC: 4.14 MIL/uL — AB (ref 4.22–5.81)
RDW: 17.9 % — AB (ref 11.5–15.5)
WBC: 4.7 10*3/uL (ref 4.0–10.5)

## 2018-02-19 LAB — LIPASE, BLOOD: Lipase: 36 U/L (ref 11–51)

## 2018-02-19 MED ORDER — HEPARIN SODIUM (PORCINE) 1000 UNIT/ML DIALYSIS: 1000.0000 [IU] | INTRAMUSCULAR | Status: DC | PRN

## 2018-02-19 MED ORDER — PENTAFLUOROPROP-TETRAFLUOROETH EX AERO: 1.0000 "application " | INHALATION_SPRAY | CUTANEOUS | Status: DC | PRN

## 2018-02-19 MED ORDER — SODIUM CHLORIDE 0.9 % IV SOLN: 100.0000 mL | INTRAVENOUS | Status: DC | PRN

## 2018-02-19 MED ORDER — HYDROMORPHONE HCL 1 MG/ML IJ SOLN
2.0000 mg | Freq: Once | INTRAMUSCULAR | Status: AC
  Administered 2018-02-19: 2 mg via INTRAVENOUS

## 2018-02-19 MED ORDER — ALTEPLASE 2 MG IJ SOLR: 2.0000 mg | Freq: Once | INTRAMUSCULAR | Status: DC | PRN

## 2018-02-19 MED ORDER — HYDROXYZINE HCL 25 MG PO TABS
25.0000 mg | ORAL_TABLET | Freq: Once | ORAL | Status: AC
  Administered 2018-02-19: 25 mg via ORAL
  Filled 2018-02-19: qty 1

## 2018-02-19 MED ORDER — CHLORHEXIDINE GLUCONATE CLOTH 2 % EX PADS: 6.0000 | MEDICATED_PAD | Freq: Every day | CUTANEOUS | Status: DC

## 2018-02-19 MED ORDER — HYDROMORPHONE HCL 1 MG/ML IJ SOLN
INTRAMUSCULAR | Status: AC
  Filled 2018-02-19: qty 2

## 2018-02-19 MED ORDER — LIDOCAINE HCL (PF) 1 % IJ SOLN: 5.0000 mL | INTRAMUSCULAR | Status: DC | PRN

## 2018-02-19 MED ORDER — LIDOCAINE-PRILOCAINE 2.5-2.5 % EX CREA: 1.0000 "application " | TOPICAL_CREAM | CUTANEOUS | Status: DC | PRN

## 2018-02-19 NOTE — Progress Notes (Signed)
Tx stopped with 3 mins left; pt having abdominal cramping; rinsebacked patient; Held sites for about 10 mins each; cramping resolved; pt states he is breathing better; was on room air most of the tx O2 sats 97-100%. Pt denies SOB, pain, dizziness, or n/v. Advised pt to go to his clinic for HD treatment tomorrow. Pt ambulated off the unit to go home.

## 2018-02-19 NOTE — ED Notes (Signed)
Pt got coffee and four sugars.
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
## 2018-02-19 NOTE — Progress Notes (Signed)
This IV RN assessed patient for PIV placement using ultrasound.  Patient was very uncooperative during assessment and attempt. Patient was encouraged to stay still during attempt and was unable to do so, and stood up to walk around room immediately after IV needle was inserted.  Dressing placed and primary RN consulted about possible EJ placement.  This RN informed patient that another attempt could be made if patient was able to cooperate.

## 2018-02-19 NOTE — ED Notes (Signed)
IV team unable to obtain laws. Patient constantly moving and would not sit still for nurse. Patient being rude to staff and stating "hurry up and lose your attitude." MD ordering medication to help with patient anxiety.

## 2018-02-19 NOTE — Procedures (Addendum)
Patient seen on Hemodialysis. Complaining of abdominal pain (h/o chronic pancreatitis) and wants IV dilaudid (because he threw up in ER and continues to have nausea). Re-educated about the need for him to go to his out-patient dialysis unit as scheduled rather than coming to the ER. Plan for DC home after dialysis.   Elmarie Shiley MD [facility]. Office # [phone] Pager # [phone] 4:18 PM

## 2018-02-19 NOTE — ED Notes (Signed)
Patient transported to X-ray.

## 2018-02-19 NOTE — ED Notes (Signed)
Pt got cup of ice.

## 2018-02-19 NOTE — ED Notes (Signed)
Patient refusing to let phlebotomy or this RN stick for blood draw. Patient states IV team has to use ultrasound.

## 2018-02-19 NOTE — ED Triage Notes (Signed)
Patient states "they let me go to early. I have fluid on my lungs and they shouldn't have let me go." Patient states he was discharged yesterday. Discharge instructions state pt was supposed to follow up with dialysis yesterday however pt states last had dialysis Tuesday. Patient speaking in full sentences.

## 2018-02-20 ENCOUNTER — Other Ambulatory Visit: Payer: Self-pay

## 2018-02-20 ENCOUNTER — Encounter (HOSPITAL_COMMUNITY): Payer: Self-pay | Admitting: *Deleted

## 2018-02-20 ENCOUNTER — Emergency Department (HOSPITAL_COMMUNITY)
Admission: EM | Admit: 2018-02-20 | Discharge: 2018-02-20 | Discharge status: Against Medical Advice | Payer: Medicare Other | Source: Home / Self Care

## 2018-02-20 ENCOUNTER — Emergency Department (HOSPITAL_COMMUNITY)
Admission: EM | Admit: 2018-02-20 | Discharge: 2018-02-20 | Disposition: A | Discharge status: Home / Self Care | Payer: Medicare Other | Attending: Emergency Medicine | Admitting: Emergency Medicine

## 2018-02-20 DIAGNOSIS — I132 Hypertensive heart and chronic kidney disease with heart failure and with stage 5 chronic kidney disease, or end stage renal disease: Secondary | ICD-10-CM | POA: Insufficient documentation

## 2018-02-20 DIAGNOSIS — E875 Hyperkalemia: Secondary | ICD-10-CM | POA: Diagnosis not present

## 2018-02-20 DIAGNOSIS — Z886 Allergy status to analgesic agent status: Secondary | ICD-10-CM | POA: Diagnosis not present

## 2018-02-20 DIAGNOSIS — Z888 Allergy status to other drugs, medicaments and biological substances status: Secondary | ICD-10-CM | POA: Diagnosis not present

## 2018-02-20 DIAGNOSIS — R0602 Shortness of breath: Secondary | ICD-10-CM

## 2018-02-20 DIAGNOSIS — E8770 Fluid overload, unspecified: Secondary | ICD-10-CM | POA: Diagnosis not present

## 2018-02-20 DIAGNOSIS — G8929 Other chronic pain: Secondary | ICD-10-CM

## 2018-02-20 DIAGNOSIS — N186 End stage renal disease: Secondary | ICD-10-CM | POA: Insufficient documentation

## 2018-02-20 DIAGNOSIS — Z86718 Personal history of other venous thrombosis and embolism: Secondary | ICD-10-CM | POA: Diagnosis not present

## 2018-02-20 DIAGNOSIS — G4733 Obstructive sleep apnea (adult) (pediatric): Secondary | ICD-10-CM | POA: Diagnosis not present

## 2018-02-20 DIAGNOSIS — Z5321 Procedure and treatment not carried out due to patient leaving prior to being seen by health care provider: Secondary | ICD-10-CM | POA: Insufficient documentation

## 2018-02-20 DIAGNOSIS — R1084 Generalized abdominal pain: Secondary | ICD-10-CM | POA: Insufficient documentation

## 2018-02-20 DIAGNOSIS — Z79899 Other long term (current) drug therapy: Secondary | ICD-10-CM | POA: Insufficient documentation

## 2018-02-20 DIAGNOSIS — I120 Hypertensive chronic kidney disease with stage 5 chronic kidney disease or end stage renal disease: Secondary | ICD-10-CM | POA: Diagnosis not present

## 2018-02-20 DIAGNOSIS — R0600 Dyspnea, unspecified: Secondary | ICD-10-CM | POA: Diagnosis not present

## 2018-02-20 DIAGNOSIS — R05 Cough: Secondary | ICD-10-CM | POA: Insufficient documentation

## 2018-02-20 DIAGNOSIS — I517 Cardiomegaly: Secondary | ICD-10-CM | POA: Diagnosis not present

## 2018-02-20 DIAGNOSIS — Z992 Dependence on renal dialysis: Secondary | ICD-10-CM | POA: Insufficient documentation

## 2018-02-20 DIAGNOSIS — J984 Other disorders of lung: Secondary | ICD-10-CM | POA: Diagnosis not present

## 2018-02-20 DIAGNOSIS — Z87891 Personal history of nicotine dependence: Secondary | ICD-10-CM | POA: Insufficient documentation

## 2018-02-20 DIAGNOSIS — J90 Pleural effusion, not elsewhere classified: Secondary | ICD-10-CM | POA: Diagnosis not present

## 2018-02-20 DIAGNOSIS — R109 Unspecified abdominal pain: Secondary | ICD-10-CM | POA: Diagnosis not present

## 2018-02-20 DIAGNOSIS — R1011 Right upper quadrant pain: Secondary | ICD-10-CM | POA: Diagnosis not present

## 2018-02-20 DIAGNOSIS — R609 Edema, unspecified: Secondary | ICD-10-CM | POA: Diagnosis not present

## 2018-02-20 DIAGNOSIS — J9601 Acute respiratory failure with hypoxia: Secondary | ICD-10-CM | POA: Diagnosis not present

## 2018-02-20 DIAGNOSIS — I5042 Chronic combined systolic (congestive) and diastolic (congestive) heart failure: Secondary | ICD-10-CM | POA: Diagnosis not present

## 2018-02-20 DIAGNOSIS — E1122 Type 2 diabetes mellitus with diabetic chronic kidney disease: Secondary | ICD-10-CM | POA: Diagnosis not present

## 2018-02-20 DIAGNOSIS — Z7901 Long term (current) use of anticoagulants: Secondary | ICD-10-CM | POA: Diagnosis not present

## 2018-02-20 NOTE — ED Notes (Signed)
Pt refused discharge vitals

## 2018-02-20 NOTE — ED Triage Notes (Signed)
Patient states that he had a full treatment of dialysis yesterday (left at 21:58) and that he is having abd pain. States that he has fluid on his lungs and states that his dr told him he was suppose to have dialysis for 5 straight days and a paracentesis. Patient states that they did not do the paracentesis while here. Pt advised that this requires a doctor's order and if wasn't done then there may be been no order. Pt seen here multiple times for same.
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
## 2018-02-20 NOTE — ED Notes (Signed)
Attempted to get blood pressure, patient repeatedly refused.

## 2018-02-20 NOTE — Discharge Instructions (Addendum)
Please Follow-up with dialysis as we discussed.

## 2018-02-20 NOTE — ED Triage Notes (Signed)
Pt in c/o continued cough and shortness of breath when walking, seen for same yesterday, had dialysis last night, pt says he has fluid on his lungs and it just isn't getting better, states they did not take enough fluid off yesterday during dialysis, no distress in triage. Pt refused EKG, states this is not that kind of pain- pt had chest xray and lab work during yesterday visit

## 2018-02-20 NOTE — ED Notes (Signed)
Pt refused d/c vitals.

## 2018-02-20 NOTE — ED Notes (Signed)
Patient called out requesting to be put on oxygen. His SPO2 was at 100% when resting his finger. He stated that "the shortness of breath comes and goes". Also requested that we change his bandage over his fistula. Tech helped with replacing the bandage.

## 2018-02-20 NOTE — ED Notes (Signed)
Unable to draw labs due to swelling in the non-restricted arm

## 2018-02-20 NOTE — ED Notes (Signed)
Pt called for room with no answer x2- it was confirmed with lobby staff that patient went outside after being triaged.

## 2018-02-21 DIAGNOSIS — G8929 Other chronic pain: Secondary | ICD-10-CM | POA: Diagnosis not present

## 2018-02-21 DIAGNOSIS — E875 Hyperkalemia: Secondary | ICD-10-CM | POA: Diagnosis not present

## 2018-02-21 DIAGNOSIS — I120 Hypertensive chronic kidney disease with stage 5 chronic kidney disease or end stage renal disease: Secondary | ICD-10-CM | POA: Diagnosis not present

## 2018-02-21 DIAGNOSIS — E162 Hypoglycemia, unspecified: Secondary | ICD-10-CM | POA: Diagnosis not present

## 2018-02-21 DIAGNOSIS — J90 Pleural effusion, not elsewhere classified: Secondary | ICD-10-CM | POA: Diagnosis not present

## 2018-02-21 DIAGNOSIS — J9601 Acute respiratory failure with hypoxia: Secondary | ICD-10-CM | POA: Diagnosis not present

## 2018-02-21 DIAGNOSIS — E8770 Fluid overload, unspecified: Secondary | ICD-10-CM | POA: Diagnosis not present

## 2018-02-21 DIAGNOSIS — E1122 Type 2 diabetes mellitus with diabetic chronic kidney disease: Secondary | ICD-10-CM | POA: Diagnosis not present

## 2018-02-21 DIAGNOSIS — N186 End stage renal disease: Secondary | ICD-10-CM | POA: Diagnosis not present

## 2018-02-21 DIAGNOSIS — Z992 Dependence on renal dialysis: Secondary | ICD-10-CM | POA: Diagnosis not present

## 2018-02-24 ENCOUNTER — Other Ambulatory Visit: Payer: Self-pay

## 2018-02-24 ENCOUNTER — Emergency Department (HOSPITAL_COMMUNITY)
Admission: EM | Admit: 2018-02-24 | Discharge: 2018-02-24 | Disposition: A | Discharge status: Home / Self Care | Payer: Medicare Other | Source: Home / Self Care | Attending: Emergency Medicine | Admitting: Emergency Medicine

## 2018-02-24 ENCOUNTER — Encounter (HOSPITAL_COMMUNITY): Payer: Self-pay

## 2018-02-24 ENCOUNTER — Emergency Department (HOSPITAL_COMMUNITY): Payer: Medicare Other

## 2018-02-24 ENCOUNTER — Encounter (HOSPITAL_COMMUNITY): Payer: Self-pay | Admitting: Emergency Medicine

## 2018-02-24 ENCOUNTER — Emergency Department (HOSPITAL_COMMUNITY)
Admission: EM | Admit: 2018-02-24 | Discharge: 2018-02-24 | Disposition: A | Discharge status: Home / Self Care | Payer: Medicare Other | Attending: Emergency Medicine | Admitting: Emergency Medicine

## 2018-02-24 DIAGNOSIS — E875 Hyperkalemia: Secondary | ICD-10-CM | POA: Diagnosis not present

## 2018-02-24 DIAGNOSIS — R202 Paresthesia of skin: Secondary | ICD-10-CM | POA: Diagnosis not present

## 2018-02-24 DIAGNOSIS — E1122 Type 2 diabetes mellitus with diabetic chronic kidney disease: Secondary | ICD-10-CM

## 2018-02-24 DIAGNOSIS — F419 Anxiety disorder, unspecified: Secondary | ICD-10-CM | POA: Insufficient documentation

## 2018-02-24 DIAGNOSIS — Z79899 Other long term (current) drug therapy: Secondary | ICD-10-CM

## 2018-02-24 DIAGNOSIS — Z7901 Long term (current) use of anticoagulants: Secondary | ICD-10-CM | POA: Insufficient documentation

## 2018-02-24 DIAGNOSIS — Z992 Dependence on renal dialysis: Secondary | ICD-10-CM | POA: Insufficient documentation

## 2018-02-24 DIAGNOSIS — R112 Nausea with vomiting, unspecified: Secondary | ICD-10-CM

## 2018-02-24 DIAGNOSIS — M546 Pain in thoracic spine: Secondary | ICD-10-CM | POA: Diagnosis not present

## 2018-02-24 DIAGNOSIS — N186 End stage renal disease: Secondary | ICD-10-CM | POA: Insufficient documentation

## 2018-02-24 DIAGNOSIS — G8929 Other chronic pain: Secondary | ICD-10-CM | POA: Insufficient documentation

## 2018-02-24 DIAGNOSIS — Z87891 Personal history of nicotine dependence: Secondary | ICD-10-CM | POA: Insufficient documentation

## 2018-02-24 DIAGNOSIS — R1013 Epigastric pain: Secondary | ICD-10-CM | POA: Diagnosis not present

## 2018-02-24 DIAGNOSIS — E11649 Type 2 diabetes mellitus with hypoglycemia without coma: Secondary | ICD-10-CM | POA: Diagnosis not present

## 2018-02-24 DIAGNOSIS — M25511 Pain in right shoulder: Secondary | ICD-10-CM | POA: Diagnosis not present

## 2018-02-24 DIAGNOSIS — R0602 Shortness of breath: Secondary | ICD-10-CM | POA: Diagnosis not present

## 2018-02-24 DIAGNOSIS — R0902 Hypoxemia: Secondary | ICD-10-CM | POA: Diagnosis not present

## 2018-02-24 DIAGNOSIS — I120 Hypertensive chronic kidney disease with stage 5 chronic kidney disease or end stage renal disease: Secondary | ICD-10-CM | POA: Diagnosis not present

## 2018-02-24 DIAGNOSIS — I4581 Long QT syndrome: Secondary | ICD-10-CM | POA: Diagnosis not present

## 2018-02-24 DIAGNOSIS — J80 Acute respiratory distress syndrome: Secondary | ICD-10-CM | POA: Diagnosis not present

## 2018-02-24 DIAGNOSIS — I132 Hypertensive heart and chronic kidney disease with heart failure and with stage 5 chronic kidney disease, or end stage renal disease: Secondary | ICD-10-CM | POA: Insufficient documentation

## 2018-02-24 DIAGNOSIS — R457 State of emotional shock and stress, unspecified: Secondary | ICD-10-CM | POA: Diagnosis not present

## 2018-02-24 DIAGNOSIS — R109 Unspecified abdominal pain: Principal | ICD-10-CM

## 2018-02-24 DIAGNOSIS — I5022 Chronic systolic (congestive) heart failure: Secondary | ICD-10-CM | POA: Diagnosis not present

## 2018-02-24 DIAGNOSIS — M545 Low back pain: Secondary | ICD-10-CM | POA: Diagnosis not present

## 2018-02-24 DIAGNOSIS — I5042 Chronic combined systolic (congestive) and diastolic (congestive) heart failure: Secondary | ICD-10-CM | POA: Insufficient documentation

## 2018-02-24 DIAGNOSIS — M542 Cervicalgia: Secondary | ICD-10-CM | POA: Diagnosis not present

## 2018-02-24 DIAGNOSIS — R0781 Pleurodynia: Secondary | ICD-10-CM | POA: Diagnosis not present

## 2018-02-24 DIAGNOSIS — R1084 Generalized abdominal pain: Secondary | ICD-10-CM | POA: Diagnosis not present

## 2018-02-24 DIAGNOSIS — J90 Pleural effusion, not elsewhere classified: Secondary | ICD-10-CM | POA: Diagnosis not present

## 2018-02-24 DIAGNOSIS — I440 Atrioventricular block, first degree: Secondary | ICD-10-CM | POA: Diagnosis not present

## 2018-02-24 DIAGNOSIS — R069 Unspecified abnormalities of breathing: Secondary | ICD-10-CM | POA: Diagnosis not present

## 2018-02-24 DIAGNOSIS — E272 Addisonian crisis: Secondary | ICD-10-CM | POA: Diagnosis not present

## 2018-02-24 DIAGNOSIS — I5084 End stage heart failure: Secondary | ICD-10-CM | POA: Insufficient documentation

## 2018-02-24 DIAGNOSIS — R0789 Other chest pain: Secondary | ICD-10-CM | POA: Diagnosis not present

## 2018-02-24 DIAGNOSIS — J9811 Atelectasis: Secondary | ICD-10-CM | POA: Diagnosis not present

## 2018-02-24 DIAGNOSIS — I2119 ST elevation (STEMI) myocardial infarction involving other coronary artery of inferior wall: Secondary | ICD-10-CM | POA: Diagnosis not present

## 2018-02-24 LAB — CBC WITH DIFFERENTIAL/PLATELET
Abs Immature Granulocytes: 0 10*3/uL (ref 0.0–0.1)
BASOS ABS: 0 10*3/uL (ref 0.0–0.1)
Basophils Relative: 1 %
Eosinophils Absolute: 0.2 10*3/uL (ref 0.0–0.7)
Eosinophils Relative: 4 %
HCT: 31.6 % — ABNORMAL LOW (ref 39.0–52.0)
HEMOGLOBIN: 10.1 g/dL — AB (ref 13.0–17.0)
Immature Granulocytes: 1 %
LYMPHS ABS: 0.5 10*3/uL — AB (ref 0.7–4.0)
LYMPHS PCT: 10 %
MCH: 26.7 pg (ref 26.0–34.0)
MCHC: 32 g/dL (ref 30.0–36.0)
MCV: 83.6 fL (ref 78.0–100.0)
MONO ABS: 0.6 10*3/uL (ref 0.1–1.0)
Monocytes Relative: 13 %
NEUTROS ABS: 3.6 10*3/uL (ref 1.7–7.7)
Neutrophils Relative %: 71 %
PLATELETS: 154 10*3/uL (ref 150–400)
RBC: 3.78 MIL/uL — AB (ref 4.22–5.81)
RDW: 18.3 % — AB (ref 11.5–15.5)
WBC: 4.9 10*3/uL (ref 4.0–10.5)

## 2018-02-24 LAB — COMPREHENSIVE METABOLIC PANEL
ALT: 21 U/L (ref 0–44)
ANION GAP: 19 — AB (ref 5–15)
AST: 46 U/L — ABNORMAL HIGH (ref 15–41)
Albumin: 3 g/dL — ABNORMAL LOW (ref 3.5–5.0)
Alkaline Phosphatase: 133 U/L — ABNORMAL HIGH (ref 38–126)
BUN: 84 mg/dL — ABNORMAL HIGH (ref 6–20)
CO2: 21 mmol/L — AB (ref 22–32)
Calcium: 8.5 mg/dL — ABNORMAL LOW (ref 8.9–10.3)
Chloride: 98 mmol/L (ref 98–111)
Creatinine, Ser: 11.01 mg/dL — ABNORMAL HIGH (ref 0.61–1.24)
GFR calc Af Amer: 5 mL/min — ABNORMAL LOW (ref >60)
GFR calc non Af Amer: 5 mL/min — ABNORMAL LOW (ref >60)
GLUCOSE: 86 mg/dL (ref 70–99)
POTASSIUM: 5.9 mmol/L — AB (ref 3.5–5.1)
SODIUM: 138 mmol/L (ref 135–145)
Total Bilirubin: 1.6 mg/dL — ABNORMAL HIGH (ref 0.3–1.2)
Total Protein: 8 g/dL (ref 6.5–8.1)

## 2018-02-24 LAB — LIPASE, BLOOD: Lipase: 90 U/L — ABNORMAL HIGH (ref 11–51)

## 2018-02-24 IMAGING — DX DG CHEST 2V
2 series · 2 of 2 positions shown · non-contrast
Comparison: Chest radiograph performed 09/23/2015

CLINICAL DATA: Acute onset of mid back pain and generalized
weakness. Shortness of breath, nausea and vomiting. Initial
encounter.

EXAM:
CHEST  2 VIEW

[chest ap]
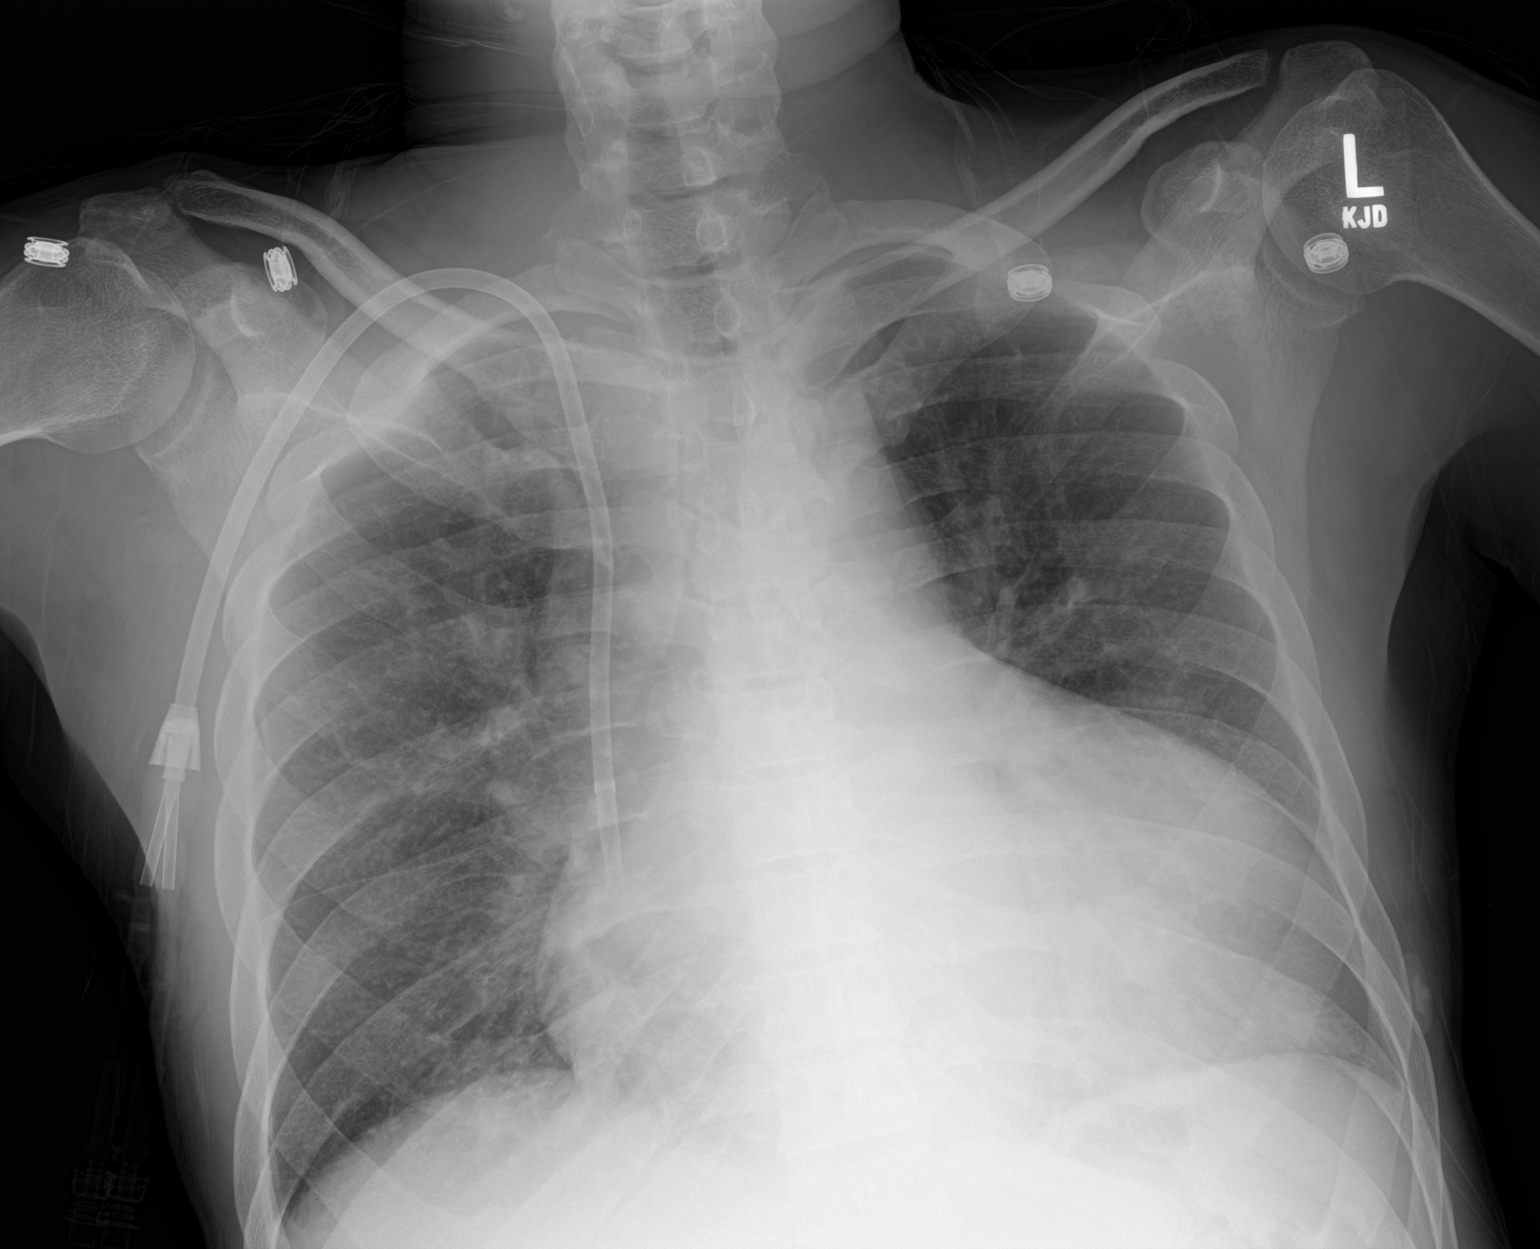

[chest lat]
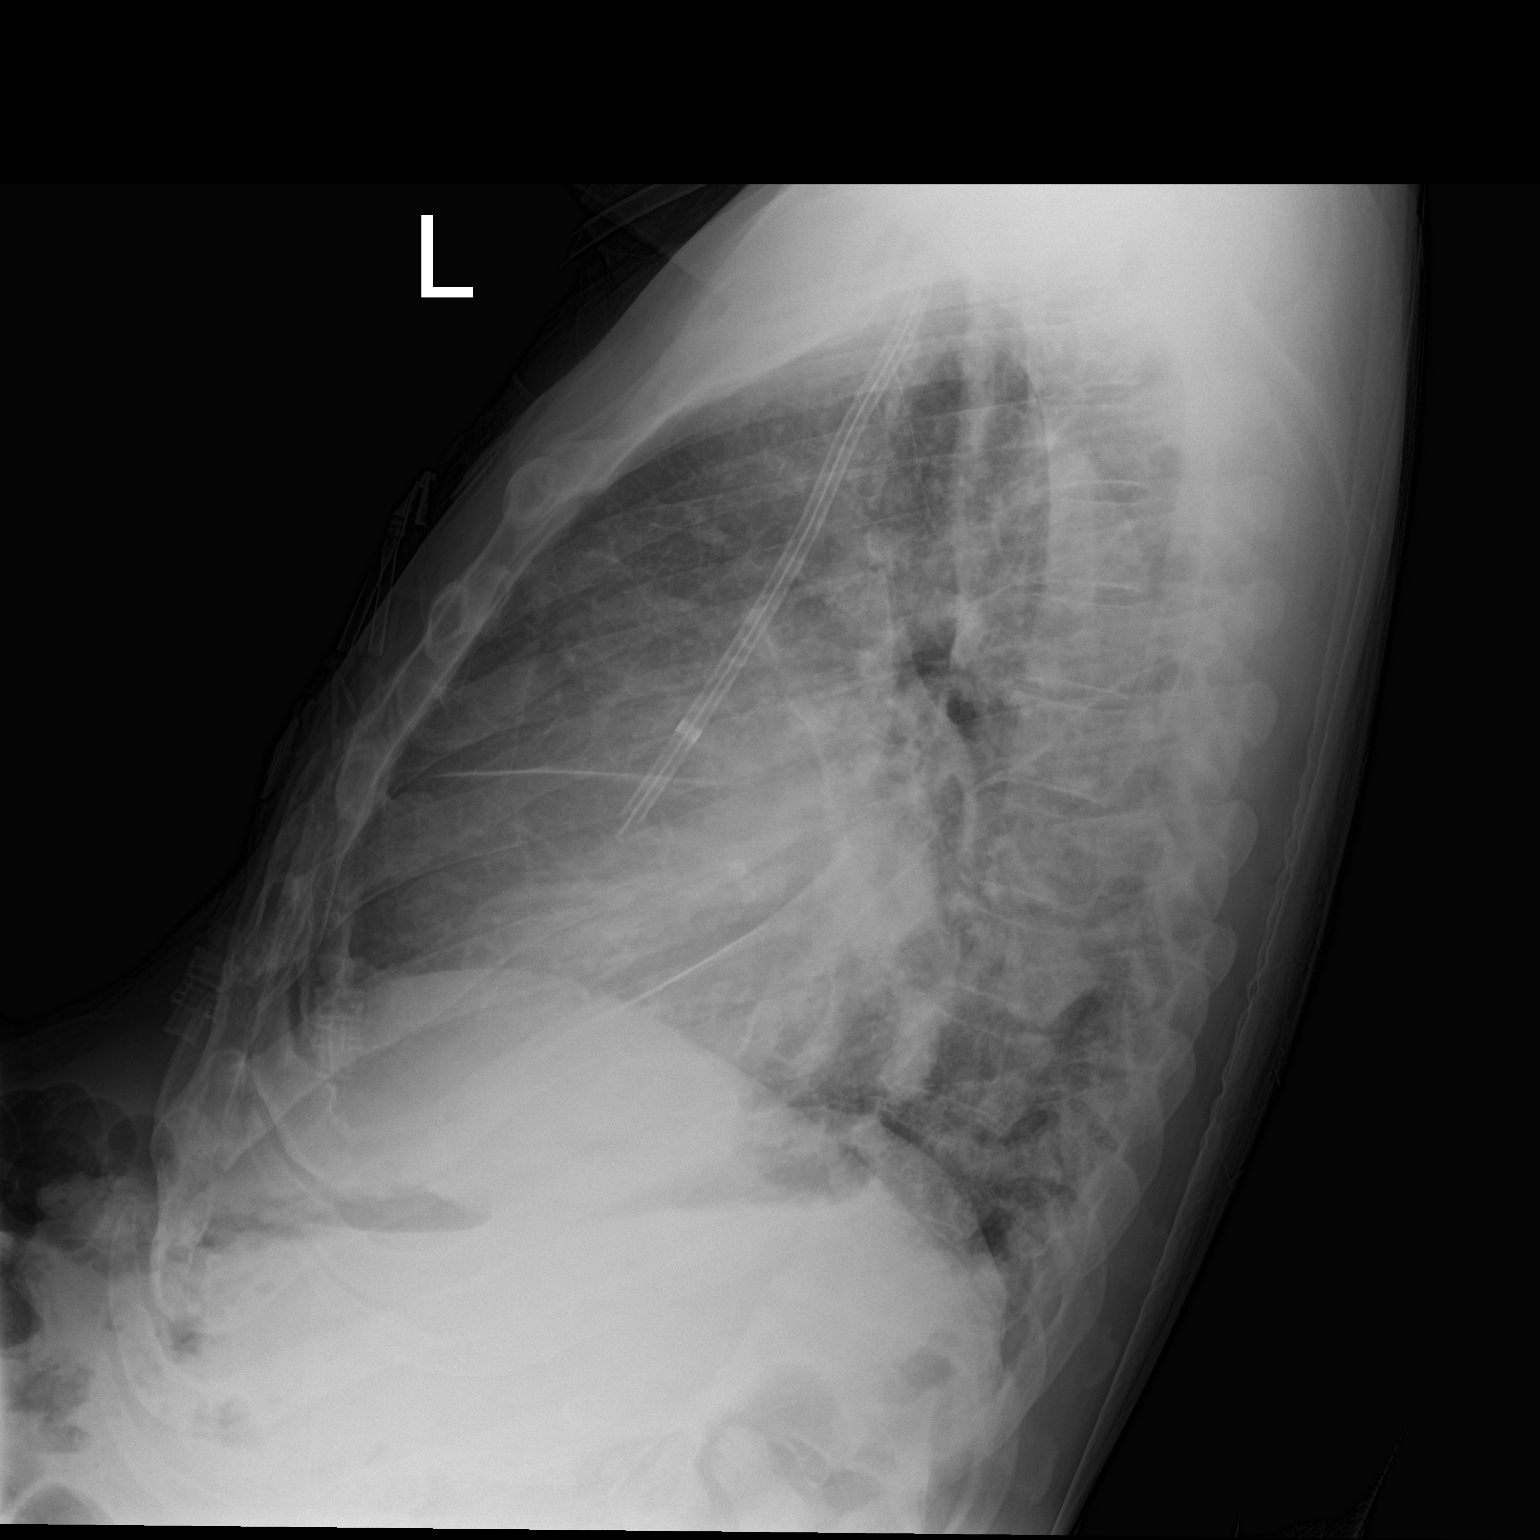

[2 of 2 positions shown; findings below may reference images not displayed]

FINDINGS: The lungs are well-aerated. Vascular congestion is noted. Mildly
increased interstitial markings could reflect minimal interstitial
edema. There is no evidence of pleural effusion or pneumothorax.

The heart is enlarged. A right-sided dual-lumen catheter is noted
ending about the cavoatrial junction. No acute osseous abnormalities
are seen.
IMPRESSION: Vascular congestion and cardiomegaly. Mildly increased interstitial
markings could reflect minimal interstitial edema.

## 2018-02-24 MED ORDER — DIPHENHYDRAMINE HCL 25 MG PO CAPS
25.0000 mg | ORAL_CAPSULE | Freq: Once | ORAL | Status: AC
  Administered 2018-02-24: 25 mg via ORAL
  Filled 2018-02-24: qty 1

## 2018-02-24 MED ORDER — HYDROXYZINE HCL 50 MG/ML IM SOLN
50.0000 mg | Freq: Four times a day (QID) | INTRAMUSCULAR | Status: DC | PRN
  Administered 2018-02-24: 50 mg via INTRAMUSCULAR
  Filled 2018-02-24: qty 1

## 2018-02-24 MED ORDER — LORAZEPAM 2 MG/ML IJ SOLN
1.0000 mg | Freq: Once | INTRAMUSCULAR | Status: AC
  Administered 2018-02-24: 1 mg via INTRAMUSCULAR
  Filled 2018-02-24: qty 1

## 2018-02-24 MED ORDER — OXYCODONE-ACETAMINOPHEN 5-325 MG PO TABS
1.0000 | ORAL_TABLET | Freq: Once | ORAL | Status: AC
  Administered 2018-02-24: 1 via ORAL
  Filled 2018-02-24: qty 1

## 2018-02-24 NOTE — ED Notes (Addendum)
Last treatment with [facility] on 7/22. Pt was hospitalized 7/27-7/30. MWF dialysis. [phone] for dialysis center in [place]. [phone] Care Coordination for [facility].

## 2018-02-24 NOTE — ED Notes (Addendum)
Pt states that he is anxious, and has fluid on his lungs. Pt would not clarify how he knows he has fluid on his lungs. Pt becomes agitated upon asking clarifying questions. Pt seems to have been going to [place], [place], and his dialysis center with this problem. When asking pt why is here today, he said it is because he doesn't want his dialysis involved in his care today, that he is concerned with the fluid on his lungs.
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
## 2018-02-24 NOTE — ED Provider Notes (Signed)
Care assumed from PA Tatyana Kirichenko at shift change, please see her note for full details, but in brief Frank [name] is a 52 y.o. male who presents for evaluation of shortness of breath and abdominal pain and requesting dialysis.  Patient was due to have dialysis yesterday but missed his appointment due to being with his girlfriend, last dialysis treatment was Saturday.  Was seen at [facility] this morning and transported diet dialysis center but they could not fit him in and so he was sent back to the ED, chest x-ray appears stable.  A care signed out in lab evaluation, if labs appear stable and patient does not have any acute indication for dialysis prior care team suggestion was discharged as he is scheduled for dialysis at his regular center tomorrow.  Patient was repeatedly requesting Ativan and Dilaudid for pain, chart review reveals he has been seen for chronic abdominal pain many times in the past, prior provider gave Vistaril, as patient had dose of Ativan this morning, patient is prescribed chronic Percocet, so we will hold off on any additional pain medications at this time.  Labs Reviewed  CBC WITH DIFFERENTIAL/PLATELET - Abnormal; Notable for the following components:      Result Value   RBC 3.78 (*)    Hemoglobin 10.1 (*)    HCT 31.6 (*)    RDW 18.3 (*)    Lymphs Abs 0.5 (*)    All other components within normal limits  COMPREHENSIVE METABOLIC PANEL - Abnormal; Notable for the following components:   Potassium 5.9 (*)    CO2 21 (*)    BUN 84 (*)    Creatinine, Ser 11.01 (*)    Calcium 8.5 (*)    Albumin 3.0 (*)    AST 46 (*)    Alkaline Phosphatase 133 (*)    Total Bilirubin 1.6 (*)    GFR calc non Af Amer 5 (*)    GFR calc Af Amer 5 (*)    Anion gap 19 (*)    All other components within normal limits  LIPASE, BLOOD - Abnormal; Notable for the following components:   Lipase 90 (*)    All other components within normal limits   Dg Chest 2 View  Result Date:  02/24/2018 CLINICAL DATA:  Shortness of breath. EXAM: CHEST - 2 VIEW COMPARISON:  Radiographs of February 19, 2018. FINDINGS: Stable cardiomegaly. Atherosclerosis of thoracic aorta is noted. No pneumothorax is noted. Stable moderate right pleural effusion is noted with underlying atelectasis or infiltrate. Mild left basilar atelectasis and possible pleural effusion is noted. Bony thorax is unremarkable. IMPRESSION: Stable moderate right pleural effusion with underlying atelectasis or infiltrate. Mild left basilar atelectasis and possible pleural effusion is noted. Electronically Signed   By: Marijo Conception, M.D.   On: 02/24/2018 14:07    Final diagnoses:  Chronic abdominal pain   Since chest x-ray is stable.  Potassium is less than 6, hemoglobin is at baseline, no leukocytosis, no other acute electrolyte derangements.  Lipase is slightly elevated at 90, but this is not high enough to suggest acute pancreatitis.  At this time I feel patient is stable for discharge, and I have stressed the importance of him going to his dialysis center tomorrow.  He continues to express needing medication for his pain in his nerves.  Patient has been offered a dose of his home oxycodone, he reports "I thought since reporting this IV and that I could get IV pain medicine" explained to the  patient that this was in order to obtain labs to ensure electrolytes were stable and that he could make it to dialysis tomorrow.  Patient given dose of home pain medicine and discharged.   At this time there does not appear to be any evidence of an acute emergency medical condition and the patient appears stable for discharge with appropriate outpatient follow up.Diagnosis was discussed with patient who verbalizes understanding and is agreeable to discharge. Pt case discussed with Dr. Regenia Skeeter Who evaluated patient as well and agrees with plan.       Jacqlyn Larsen, PA-C 02/24/18 Sandrea Hughs    Sherwood Gambler, MD 02/26/18 [phone]

## 2018-02-24 NOTE — Discharge Instructions (Addendum)
[facility] [address] [place], [place] [address] [phone] Description: A variety of behavioral health services are offered, including: Open Access  Outpatient Therapy & Psychiatric Services  Assertive Community Treatment Team (ACTT) (adults only)  [facility] (children & adults)  [facility]ive Engagement (children & adults)  Peer Support Services Referrals may be made to the facility-based crisis center 24/7, 365 days a year. Walk-ins are always welcome or you may call ahead to speak with a [facility] staff member regarding availability (preferred method).

## 2018-02-24 NOTE — ED Notes (Signed)
While reviewing discharge instructions with patient, patient verbally aggressive and rude toward staff. Stating "I know exactly what to eat to make sure I get dialysis emergently tonight. I'm going to come every other day and get my dialysis here."

## 2018-02-24 NOTE — ED Notes (Signed)
Patient pacing back and forth from room to bathroom. Calling at nurse stating he is short of breath but refusing to leave oxygen on. Patient yelling at staff for pain medicine. Patient stating "Come on you fresh piece of cookie, get me some medicine."

## 2018-02-24 NOTE — ED Notes (Signed)
Patient transported to X-ray.
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
## 2018-02-24 NOTE — ED Triage Notes (Signed)
Pt presents to ED from home for SOB and N/V. Pt reports that he missed dialysis yesterday. Pt seen recently for same complaints.

## 2018-02-24 NOTE — ED Notes (Signed)
Pt refused to change into a Gown and refused Vitals until I got him a blanket pt stated he cant breath has O2 sitting on his forehead.

## 2018-02-24 NOTE — Discharge Instructions (Addendum)
Go to dialysis today. Do not pass "GO." Do not collect $200. Just go.

## 2018-02-24 NOTE — ED Notes (Signed)
Pt still refusing vitals to be checked.

## 2018-02-24 NOTE — ED Triage Notes (Signed)
Per [facility] pt called out from dialysis center stating they were taking too long. Dialysis center states they were attempting to fit patient in. Patient states they took too much fluid off last week and c/o left sided rib cage pain. Patient refused to let EMS obtain blood pressure.

## 2018-02-25 DIAGNOSIS — M546 Pain in thoracic spine: Secondary | ICD-10-CM | POA: Diagnosis not present

## 2018-02-25 DIAGNOSIS — I509 Heart failure, unspecified: Secondary | ICD-10-CM | POA: Diagnosis not present

## 2018-02-25 DIAGNOSIS — I132 Hypertensive heart and chronic kidney disease with heart failure and with stage 5 chronic kidney disease, or end stage renal disease: Secondary | ICD-10-CM | POA: Diagnosis present

## 2018-02-25 DIAGNOSIS — J90 Pleural effusion, not elsewhere classified: Secondary | ICD-10-CM | POA: Diagnosis not present

## 2018-02-25 DIAGNOSIS — I440 Atrioventricular block, first degree: Secondary | ICD-10-CM | POA: Diagnosis not present

## 2018-02-25 DIAGNOSIS — G4739 Other sleep apnea: Secondary | ICD-10-CM | POA: Diagnosis not present

## 2018-02-25 DIAGNOSIS — Z86718 Personal history of other venous thrombosis and embolism: Secondary | ICD-10-CM | POA: Diagnosis not present

## 2018-02-25 DIAGNOSIS — G4731 Primary central sleep apnea: Secondary | ICD-10-CM | POA: Diagnosis not present

## 2018-02-25 DIAGNOSIS — M25511 Pain in right shoulder: Secondary | ICD-10-CM | POA: Diagnosis not present

## 2018-02-25 DIAGNOSIS — Z9115 Patient's noncompliance with renal dialysis: Secondary | ICD-10-CM | POA: Diagnosis not present

## 2018-02-25 DIAGNOSIS — M25519 Pain in unspecified shoulder: Secondary | ICD-10-CM | POA: Diagnosis not present

## 2018-02-25 DIAGNOSIS — I5022 Chronic systolic (congestive) heart failure: Secondary | ICD-10-CM | POA: Diagnosis present

## 2018-02-25 DIAGNOSIS — M549 Dorsalgia, unspecified: Secondary | ICD-10-CM | POA: Diagnosis not present

## 2018-02-25 DIAGNOSIS — E875 Hyperkalemia: Secondary | ICD-10-CM | POA: Diagnosis not present

## 2018-02-25 DIAGNOSIS — I129 Hypertensive chronic kidney disease with stage 1 through stage 4 chronic kidney disease, or unspecified chronic kidney disease: Secondary | ICD-10-CM | POA: Diagnosis not present

## 2018-02-25 DIAGNOSIS — I4581 Long QT syndrome: Secondary | ICD-10-CM | POA: Diagnosis not present

## 2018-02-25 DIAGNOSIS — M542 Cervicalgia: Secondary | ICD-10-CM | POA: Diagnosis not present

## 2018-02-25 DIAGNOSIS — I2119 ST elevation (STEMI) myocardial infarction involving other coronary artery of inferior wall: Secondary | ICD-10-CM | POA: Diagnosis not present

## 2018-02-25 DIAGNOSIS — Z765 Malingerer [conscious simulation]: Secondary | ICD-10-CM | POA: Diagnosis not present

## 2018-02-25 DIAGNOSIS — R488 Other symbolic dysfunctions: Secondary | ICD-10-CM | POA: Diagnosis present

## 2018-02-25 DIAGNOSIS — J918 Pleural effusion in other conditions classified elsewhere: Secondary | ICD-10-CM | POA: Diagnosis present

## 2018-02-25 DIAGNOSIS — I5020 Unspecified systolic (congestive) heart failure: Secondary | ICD-10-CM | POA: Diagnosis not present

## 2018-02-25 DIAGNOSIS — E161 Other hypoglycemia: Secondary | ICD-10-CM | POA: Diagnosis not present

## 2018-02-25 DIAGNOSIS — G4733 Obstructive sleep apnea (adult) (pediatric): Secondary | ICD-10-CM | POA: Diagnosis present

## 2018-02-25 DIAGNOSIS — Z992 Dependence on renal dialysis: Secondary | ICD-10-CM | POA: Diagnosis not present

## 2018-02-25 DIAGNOSIS — E162 Hypoglycemia, unspecified: Secondary | ICD-10-CM | POA: Diagnosis not present

## 2018-02-25 DIAGNOSIS — I10 Essential (primary) hypertension: Secondary | ICD-10-CM | POA: Diagnosis not present

## 2018-02-25 DIAGNOSIS — E1122 Type 2 diabetes mellitus with diabetic chronic kidney disease: Secondary | ICD-10-CM | POA: Diagnosis not present

## 2018-02-25 DIAGNOSIS — Z9989 Dependence on other enabling machines and devices: Secondary | ICD-10-CM | POA: Diagnosis not present

## 2018-02-25 DIAGNOSIS — M6281 Muscle weakness (generalized): Secondary | ICD-10-CM | POA: Diagnosis present

## 2018-02-25 DIAGNOSIS — E11649 Type 2 diabetes mellitus with hypoglycemia without coma: Secondary | ICD-10-CM | POA: Diagnosis not present

## 2018-02-25 DIAGNOSIS — Z741 Need for assistance with personal care: Secondary | ICD-10-CM | POA: Diagnosis present

## 2018-02-25 DIAGNOSIS — J9811 Atelectasis: Secondary | ICD-10-CM | POA: Diagnosis present

## 2018-02-25 DIAGNOSIS — N186 End stage renal disease: Secondary | ICD-10-CM | POA: Diagnosis not present

## 2018-02-25 DIAGNOSIS — R2681 Unsteadiness on feet: Secondary | ICD-10-CM | POA: Diagnosis present

## 2018-02-25 DIAGNOSIS — Z85528 Personal history of other malignant neoplasm of kidney: Secondary | ICD-10-CM | POA: Diagnosis not present

## 2018-02-25 DIAGNOSIS — I120 Hypertensive chronic kidney disease with stage 5 chronic kidney disease or end stage renal disease: Secondary | ICD-10-CM | POA: Diagnosis not present

## 2018-02-25 DIAGNOSIS — Z8719 Personal history of other diseases of the digestive system: Secondary | ICD-10-CM | POA: Diagnosis not present

## 2018-02-25 DIAGNOSIS — Z79899 Other long term (current) drug therapy: Secondary | ICD-10-CM | POA: Diagnosis not present

## 2018-02-25 DIAGNOSIS — E272 Addisonian crisis: Secondary | ICD-10-CM | POA: Diagnosis not present

## 2018-02-25 DIAGNOSIS — R2689 Other abnormalities of gait and mobility: Secondary | ICD-10-CM | POA: Diagnosis present

## 2018-02-26 DIAGNOSIS — Z515 Encounter for palliative care: Secondary | ICD-10-CM

## 2018-02-26 DIAGNOSIS — Z7189 Other specified counseling: Secondary | ICD-10-CM

## 2018-02-27 DIAGNOSIS — N185 Chronic kidney disease, stage 5: Secondary | ICD-10-CM | POA: Diagnosis not present

## 2018-02-27 DIAGNOSIS — N186 End stage renal disease: Secondary | ICD-10-CM | POA: Diagnosis not present

## 2018-02-27 DIAGNOSIS — I5020 Unspecified systolic (congestive) heart failure: Secondary | ICD-10-CM | POA: Diagnosis not present

## 2018-02-27 DIAGNOSIS — R2689 Other abnormalities of gait and mobility: Secondary | ICD-10-CM | POA: Diagnosis present

## 2018-02-27 DIAGNOSIS — J811 Chronic pulmonary edema: Secondary | ICD-10-CM | POA: Diagnosis not present

## 2018-02-27 DIAGNOSIS — D631 Anemia in chronic kidney disease: Secondary | ICD-10-CM | POA: Diagnosis not present

## 2018-02-27 DIAGNOSIS — J9811 Atelectasis: Secondary | ICD-10-CM | POA: Diagnosis not present

## 2018-02-27 DIAGNOSIS — J918 Pleural effusion in other conditions classified elsewhere: Secondary | ICD-10-CM | POA: Diagnosis present

## 2018-02-27 DIAGNOSIS — Z8719 Personal history of other diseases of the digestive system: Secondary | ICD-10-CM | POA: Diagnosis not present

## 2018-02-27 DIAGNOSIS — Z9989 Dependence on other enabling machines and devices: Secondary | ICD-10-CM | POA: Diagnosis not present

## 2018-02-27 DIAGNOSIS — I5022 Chronic systolic (congestive) heart failure: Secondary | ICD-10-CM | POA: Diagnosis not present

## 2018-02-27 DIAGNOSIS — I509 Heart failure, unspecified: Secondary | ICD-10-CM | POA: Diagnosis not present

## 2018-02-27 DIAGNOSIS — I82611 Acute embolism and thrombosis of superficial veins of right upper extremity: Secondary | ICD-10-CM | POA: Diagnosis not present

## 2018-02-27 DIAGNOSIS — Z9115 Patient's noncompliance with renal dialysis: Secondary | ICD-10-CM | POA: Diagnosis not present

## 2018-02-27 DIAGNOSIS — I517 Cardiomegaly: Secondary | ICD-10-CM | POA: Diagnosis not present

## 2018-02-27 DIAGNOSIS — I82409 Acute embolism and thrombosis of unspecified deep veins of unspecified lower extremity: Secondary | ICD-10-CM | POA: Diagnosis not present

## 2018-02-27 DIAGNOSIS — I10 Essential (primary) hypertension: Secondary | ICD-10-CM | POA: Diagnosis present

## 2018-02-27 DIAGNOSIS — M25519 Pain in unspecified shoulder: Secondary | ICD-10-CM | POA: Diagnosis not present

## 2018-02-27 DIAGNOSIS — D509 Iron deficiency anemia, unspecified: Secondary | ICD-10-CM | POA: Diagnosis not present

## 2018-02-27 DIAGNOSIS — G4701 Insomnia due to medical condition: Secondary | ICD-10-CM | POA: Diagnosis not present

## 2018-02-27 DIAGNOSIS — R1011 Right upper quadrant pain: Secondary | ICD-10-CM | POA: Diagnosis not present

## 2018-02-27 DIAGNOSIS — J9621 Acute and chronic respiratory failure with hypoxia: Secondary | ICD-10-CM | POA: Diagnosis not present

## 2018-02-27 DIAGNOSIS — I428 Other cardiomyopathies: Secondary | ICD-10-CM | POA: Diagnosis not present

## 2018-02-27 DIAGNOSIS — R488 Other symbolic dysfunctions: Secondary | ICD-10-CM | POA: Diagnosis present

## 2018-02-27 DIAGNOSIS — R079 Chest pain, unspecified: Secondary | ICD-10-CM | POA: Diagnosis not present

## 2018-02-27 DIAGNOSIS — R918 Other nonspecific abnormal finding of lung field: Secondary | ICD-10-CM | POA: Diagnosis not present

## 2018-02-27 DIAGNOSIS — Z992 Dependence on renal dialysis: Secondary | ICD-10-CM | POA: Diagnosis not present

## 2018-02-27 DIAGNOSIS — I2699 Other pulmonary embolism without acute cor pulmonale: Secondary | ICD-10-CM | POA: Diagnosis not present

## 2018-02-27 DIAGNOSIS — M549 Dorsalgia, unspecified: Secondary | ICD-10-CM | POA: Diagnosis not present

## 2018-02-27 DIAGNOSIS — L219 Seborrheic dermatitis, unspecified: Secondary | ICD-10-CM | POA: Diagnosis not present

## 2018-02-27 DIAGNOSIS — R531 Weakness: Secondary | ICD-10-CM | POA: Diagnosis not present

## 2018-02-27 DIAGNOSIS — Z765 Malingerer [conscious simulation]: Secondary | ICD-10-CM | POA: Diagnosis not present

## 2018-02-27 DIAGNOSIS — I120 Hypertensive chronic kidney disease with stage 5 chronic kidney disease or end stage renal disease: Secondary | ICD-10-CM | POA: Diagnosis not present

## 2018-02-27 DIAGNOSIS — N2581 Secondary hyperparathyroidism of renal origin: Secondary | ICD-10-CM | POA: Diagnosis not present

## 2018-02-27 DIAGNOSIS — F4323 Adjustment disorder with mixed anxiety and depressed mood: Secondary | ICD-10-CM | POA: Diagnosis not present

## 2018-02-27 DIAGNOSIS — E11649 Type 2 diabetes mellitus with hypoglycemia without coma: Secondary | ICD-10-CM | POA: Diagnosis not present

## 2018-02-27 DIAGNOSIS — M25512 Pain in left shoulder: Secondary | ICD-10-CM | POA: Diagnosis not present

## 2018-02-27 DIAGNOSIS — K859 Acute pancreatitis without necrosis or infection, unspecified: Secondary | ICD-10-CM | POA: Diagnosis not present

## 2018-02-27 DIAGNOSIS — K219 Gastro-esophageal reflux disease without esophagitis: Secondary | ICD-10-CM | POA: Diagnosis not present

## 2018-02-27 DIAGNOSIS — R05 Cough: Secondary | ICD-10-CM | POA: Diagnosis not present

## 2018-02-27 DIAGNOSIS — I132 Hypertensive heart and chronic kidney disease with heart failure and with stage 5 chronic kidney disease, or end stage renal disease: Secondary | ICD-10-CM | POA: Diagnosis not present

## 2018-02-27 DIAGNOSIS — G4739 Other sleep apnea: Secondary | ICD-10-CM | POA: Diagnosis not present

## 2018-02-27 DIAGNOSIS — Z741 Need for assistance with personal care: Secondary | ICD-10-CM | POA: Diagnosis present

## 2018-02-27 DIAGNOSIS — M6281 Muscle weakness (generalized): Secondary | ICD-10-CM | POA: Diagnosis present

## 2018-02-27 DIAGNOSIS — E875 Hyperkalemia: Secondary | ICD-10-CM | POA: Diagnosis present

## 2018-02-27 DIAGNOSIS — J90 Pleural effusion, not elsewhere classified: Secondary | ICD-10-CM | POA: Diagnosis not present

## 2018-02-27 DIAGNOSIS — E1129 Type 2 diabetes mellitus with other diabetic kidney complication: Secondary | ICD-10-CM | POA: Diagnosis not present

## 2018-02-27 DIAGNOSIS — E1122 Type 2 diabetes mellitus with diabetic chronic kidney disease: Secondary | ICD-10-CM | POA: Diagnosis not present

## 2018-02-27 DIAGNOSIS — R2681 Unsteadiness on feet: Secondary | ICD-10-CM | POA: Diagnosis present

## 2018-03-02 DIAGNOSIS — D631 Anemia in chronic kidney disease: Secondary | ICD-10-CM | POA: Diagnosis not present

## 2018-03-02 DIAGNOSIS — D509 Iron deficiency anemia, unspecified: Secondary | ICD-10-CM | POA: Diagnosis not present

## 2018-03-02 DIAGNOSIS — N2581 Secondary hyperparathyroidism of renal origin: Secondary | ICD-10-CM | POA: Diagnosis not present

## 2018-03-02 DIAGNOSIS — N186 End stage renal disease: Secondary | ICD-10-CM | POA: Diagnosis not present

## 2018-03-02 DIAGNOSIS — Z992 Dependence on renal dialysis: Secondary | ICD-10-CM | POA: Diagnosis not present

## 2018-03-03 ENCOUNTER — Non-Acute Institutional Stay (SKILLED_NURSING_FACILITY): Payer: Medicare Other | Admitting: Adult Health

## 2018-03-03 ENCOUNTER — Encounter: Payer: Self-pay | Admitting: Adult Health

## 2018-03-03 DIAGNOSIS — N186 End stage renal disease: Secondary | ICD-10-CM | POA: Diagnosis not present

## 2018-03-03 DIAGNOSIS — N185 Chronic kidney disease, stage 5: Secondary | ICD-10-CM | POA: Diagnosis not present

## 2018-03-03 DIAGNOSIS — I428 Other cardiomyopathies: Secondary | ICD-10-CM

## 2018-03-03 DIAGNOSIS — I2699 Other pulmonary embolism without acute cor pulmonale: Secondary | ICD-10-CM

## 2018-03-03 DIAGNOSIS — I82409 Acute embolism and thrombosis of unspecified deep veins of unspecified lower extremity: Secondary | ICD-10-CM | POA: Diagnosis not present

## 2018-03-03 DIAGNOSIS — I132 Hypertensive heart and chronic kidney disease with heart failure and with stage 5 chronic kidney disease, or end stage renal disease: Secondary | ICD-10-CM

## 2018-03-03 DIAGNOSIS — R1011 Right upper quadrant pain: Secondary | ICD-10-CM | POA: Diagnosis not present

## 2018-03-03 DIAGNOSIS — F4323 Adjustment disorder with mixed anxiety and depressed mood: Secondary | ICD-10-CM | POA: Diagnosis not present

## 2018-03-03 DIAGNOSIS — E1129 Type 2 diabetes mellitus with other diabetic kidney complication: Secondary | ICD-10-CM | POA: Diagnosis not present

## 2018-03-03 DIAGNOSIS — K219 Gastro-esophageal reflux disease without esophagitis: Secondary | ICD-10-CM

## 2018-03-03 DIAGNOSIS — J90 Pleural effusion, not elsewhere classified: Secondary | ICD-10-CM | POA: Diagnosis not present

## 2018-03-03 DIAGNOSIS — D631 Anemia in chronic kidney disease: Secondary | ICD-10-CM

## 2018-03-03 DIAGNOSIS — Z992 Dependence on renal dialysis: Secondary | ICD-10-CM

## 2018-03-03 DIAGNOSIS — I5020 Unspecified systolic (congestive) heart failure: Secondary | ICD-10-CM

## 2018-03-03 DIAGNOSIS — IMO0002 Reserved for concepts with insufficient information to code with codable children: Secondary | ICD-10-CM

## 2018-03-03 DIAGNOSIS — M25512 Pain in left shoulder: Secondary | ICD-10-CM | POA: Diagnosis not present

## 2018-03-03 DIAGNOSIS — E1165 Type 2 diabetes mellitus with hyperglycemia: Secondary | ICD-10-CM

## 2018-03-03 DIAGNOSIS — G8929 Other chronic pain: Secondary | ICD-10-CM
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
## 2018-03-05 ENCOUNTER — Encounter: Payer: Self-pay | Admitting: Internal Medicine

## 2018-03-05 ENCOUNTER — Non-Acute Institutional Stay (SKILLED_NURSING_FACILITY): Payer: Medicare Other | Admitting: Internal Medicine

## 2018-03-05 DIAGNOSIS — F4323 Adjustment disorder with mixed anxiety and depressed mood: Secondary | ICD-10-CM

## 2018-03-05 DIAGNOSIS — Z992 Dependence on renal dialysis: Secondary | ICD-10-CM | POA: Diagnosis not present

## 2018-03-05 DIAGNOSIS — R531 Weakness: Secondary | ICD-10-CM

## 2018-03-05 DIAGNOSIS — E1122 Type 2 diabetes mellitus with diabetic chronic kidney disease: Secondary | ICD-10-CM | POA: Diagnosis not present

## 2018-03-05 DIAGNOSIS — D631 Anemia in chronic kidney disease: Secondary | ICD-10-CM | POA: Diagnosis not present

## 2018-03-05 DIAGNOSIS — N186 End stage renal disease: Secondary | ICD-10-CM | POA: Diagnosis not present

## 2018-03-05 DIAGNOSIS — J90 Pleural effusion, not elsewhere classified: Secondary | ICD-10-CM | POA: Diagnosis not present

## 2018-03-05 DIAGNOSIS — K859 Acute pancreatitis without necrosis or infection, unspecified: Secondary | ICD-10-CM

## 2018-03-05 DIAGNOSIS — Z9115 Patient's noncompliance with renal dialysis: Secondary | ICD-10-CM | POA: Diagnosis not present

## 2018-03-05 DIAGNOSIS — Z91158 Patient's noncompliance with renal dialysis for other reason: Secondary | ICD-10-CM

## 2018-03-05 DIAGNOSIS — N185 Chronic kidney disease, stage 5: Secondary | ICD-10-CM

## 2018-03-05 DIAGNOSIS — I82611 Acute embolism and thrombosis of superficial veins of right upper extremity: Secondary | ICD-10-CM

## 2018-03-05 HISTORY — DX: Patient's noncompliance with renal dialysis: Z91.15

## 2018-03-05 HISTORY — DX: Patient's noncompliance with renal dialysis for other reason: Z91.158
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
## 2018-03-06 DIAGNOSIS — D631 Anemia in chronic kidney disease: Secondary | ICD-10-CM | POA: Diagnosis not present

## 2018-03-06 DIAGNOSIS — N2581 Secondary hyperparathyroidism of renal origin: Secondary | ICD-10-CM | POA: Diagnosis not present

## 2018-03-06 DIAGNOSIS — N186 End stage renal disease: Secondary | ICD-10-CM | POA: Diagnosis not present

## 2018-03-06 DIAGNOSIS — D509 Iron deficiency anemia, unspecified: Secondary | ICD-10-CM | POA: Diagnosis not present

## 2018-03-06 DIAGNOSIS — Z992 Dependence on renal dialysis: Secondary | ICD-10-CM | POA: Diagnosis not present

## 2018-03-06 IMAGING — CR DG CHEST 2V
2 series · 2 of 2 positions shown · non-contrast
Comparison: 10/06/2015 and 09/23/2015.

CLINICAL DATA: Chest pain with shortness of breath. Dialysis 3 days
ago. Recent hospitalization.

EXAM:
CHEST  2 VIEW

[chest lat]
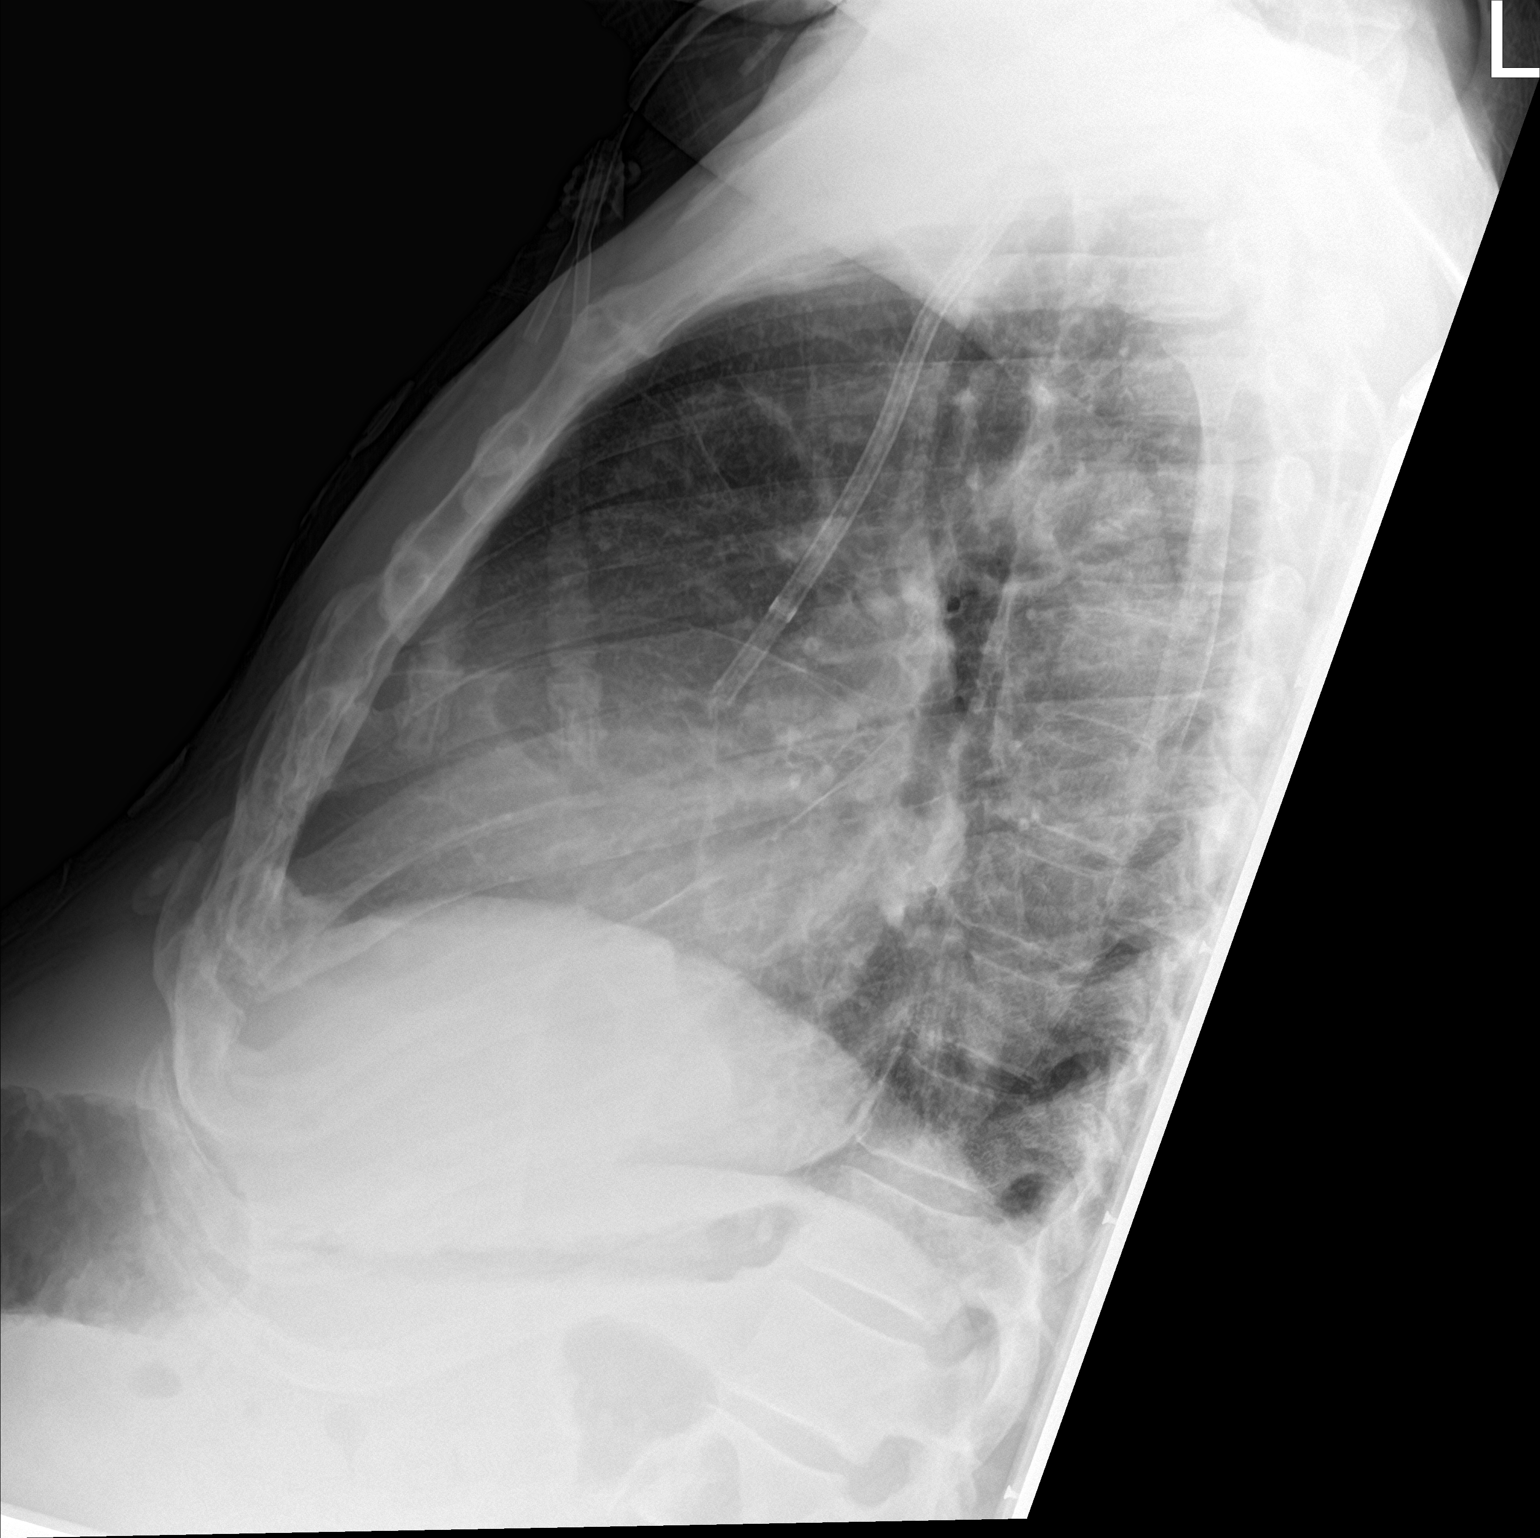

[chest ap]
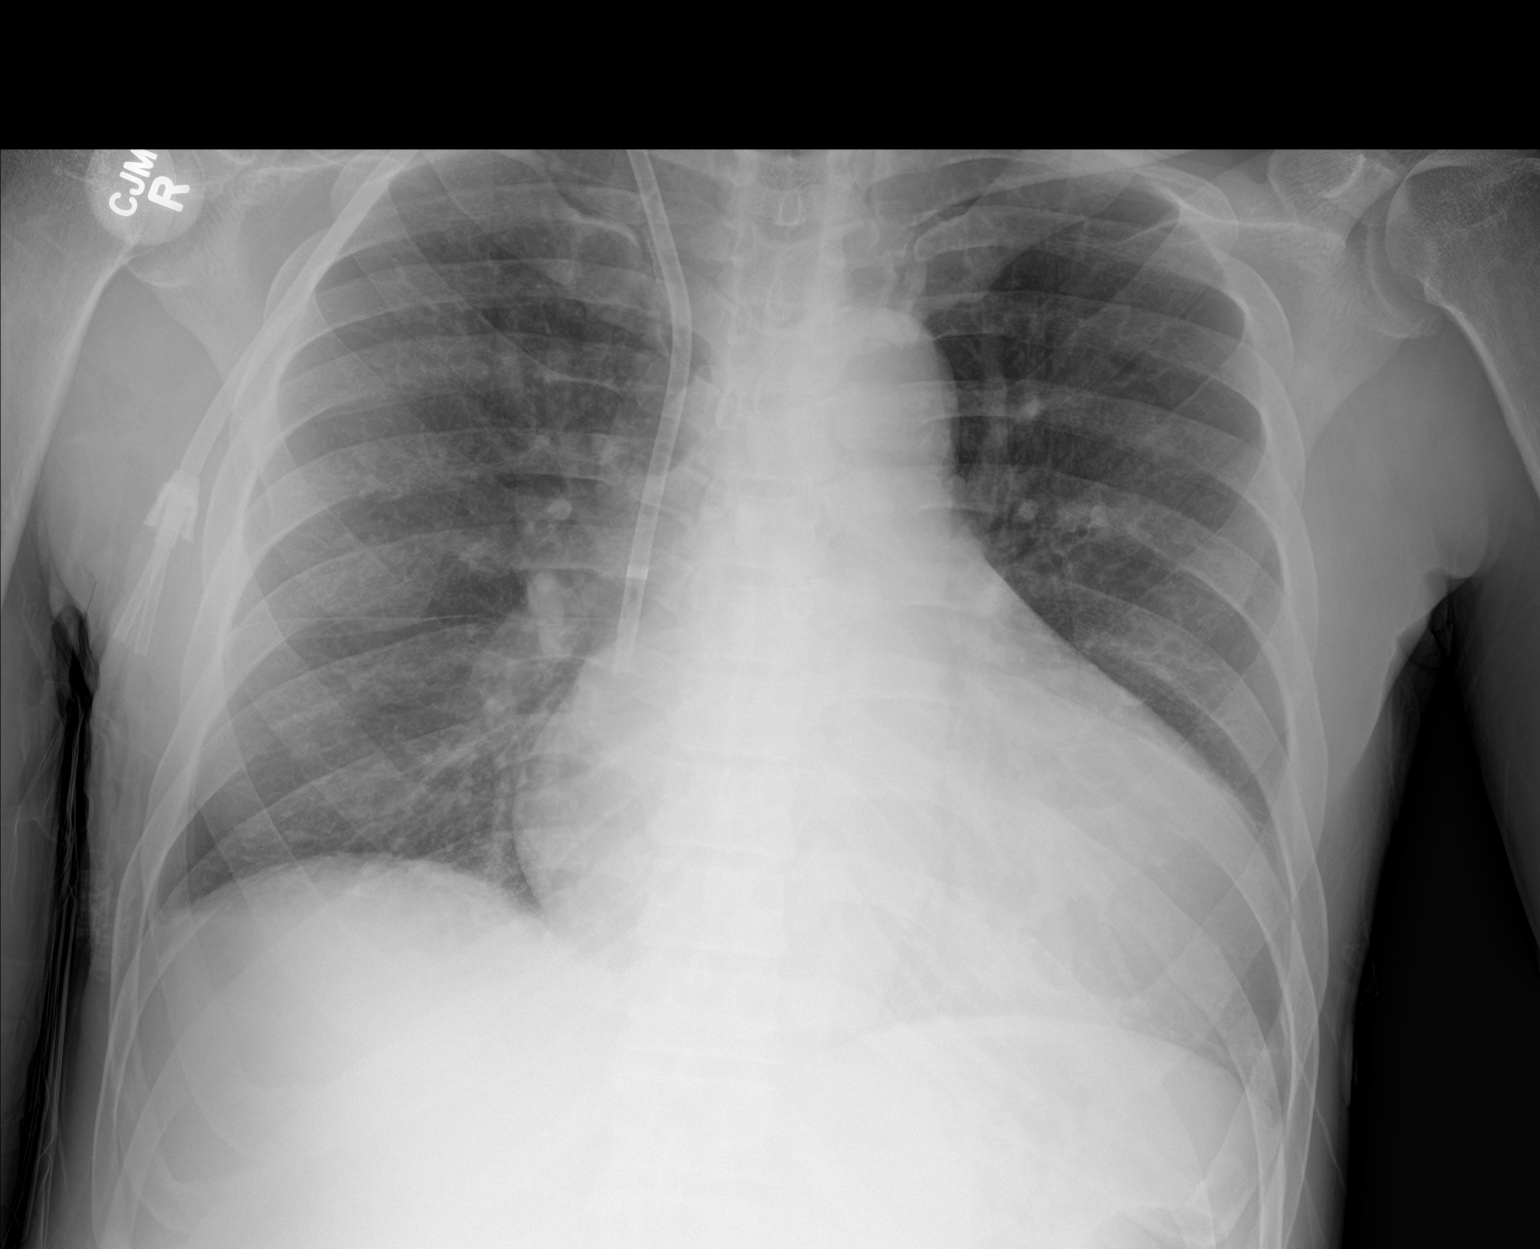

[2 of 2 positions shown; findings below may reference images not displayed]

FINDINGS: 0602 hours. Right IJ dialysis catheters are unchanged at the SVC
right atrial level. The mediastinal contours and cardiomegaly appear
unchanged. There is mild chronic vascular congestion without overt
pulmonary edema, confluent airspace opacity or significant pleural
effusion. The bones appear unchanged.
IMPRESSION: Cardiomegaly with chronic vascular congestion. No edema or other
definite acute findings.

## 2018-03-09 DIAGNOSIS — D509 Iron deficiency anemia, unspecified: Secondary | ICD-10-CM | POA: Diagnosis not present

## 2018-03-09 DIAGNOSIS — N2581 Secondary hyperparathyroidism of renal origin: Secondary | ICD-10-CM | POA: Diagnosis not present

## 2018-03-09 DIAGNOSIS — Z992 Dependence on renal dialysis: Secondary | ICD-10-CM | POA: Diagnosis not present

## 2018-03-09 DIAGNOSIS — N186 End stage renal disease: Secondary | ICD-10-CM | POA: Diagnosis not present

## 2018-03-09 DIAGNOSIS — D631 Anemia in chronic kidney disease: Secondary | ICD-10-CM | POA: Diagnosis not present

## 2018-03-09 IMAGING — CR DG CHEST 2V
2 series · 2 of 2 positions shown · non-contrast
Comparison: 10/16/2015

CLINICAL DATA: HTN, ESRD, dialysis patient, Type II diabetes, CHF

EXAM:
CHEST  2 VIEW

[chest pa]
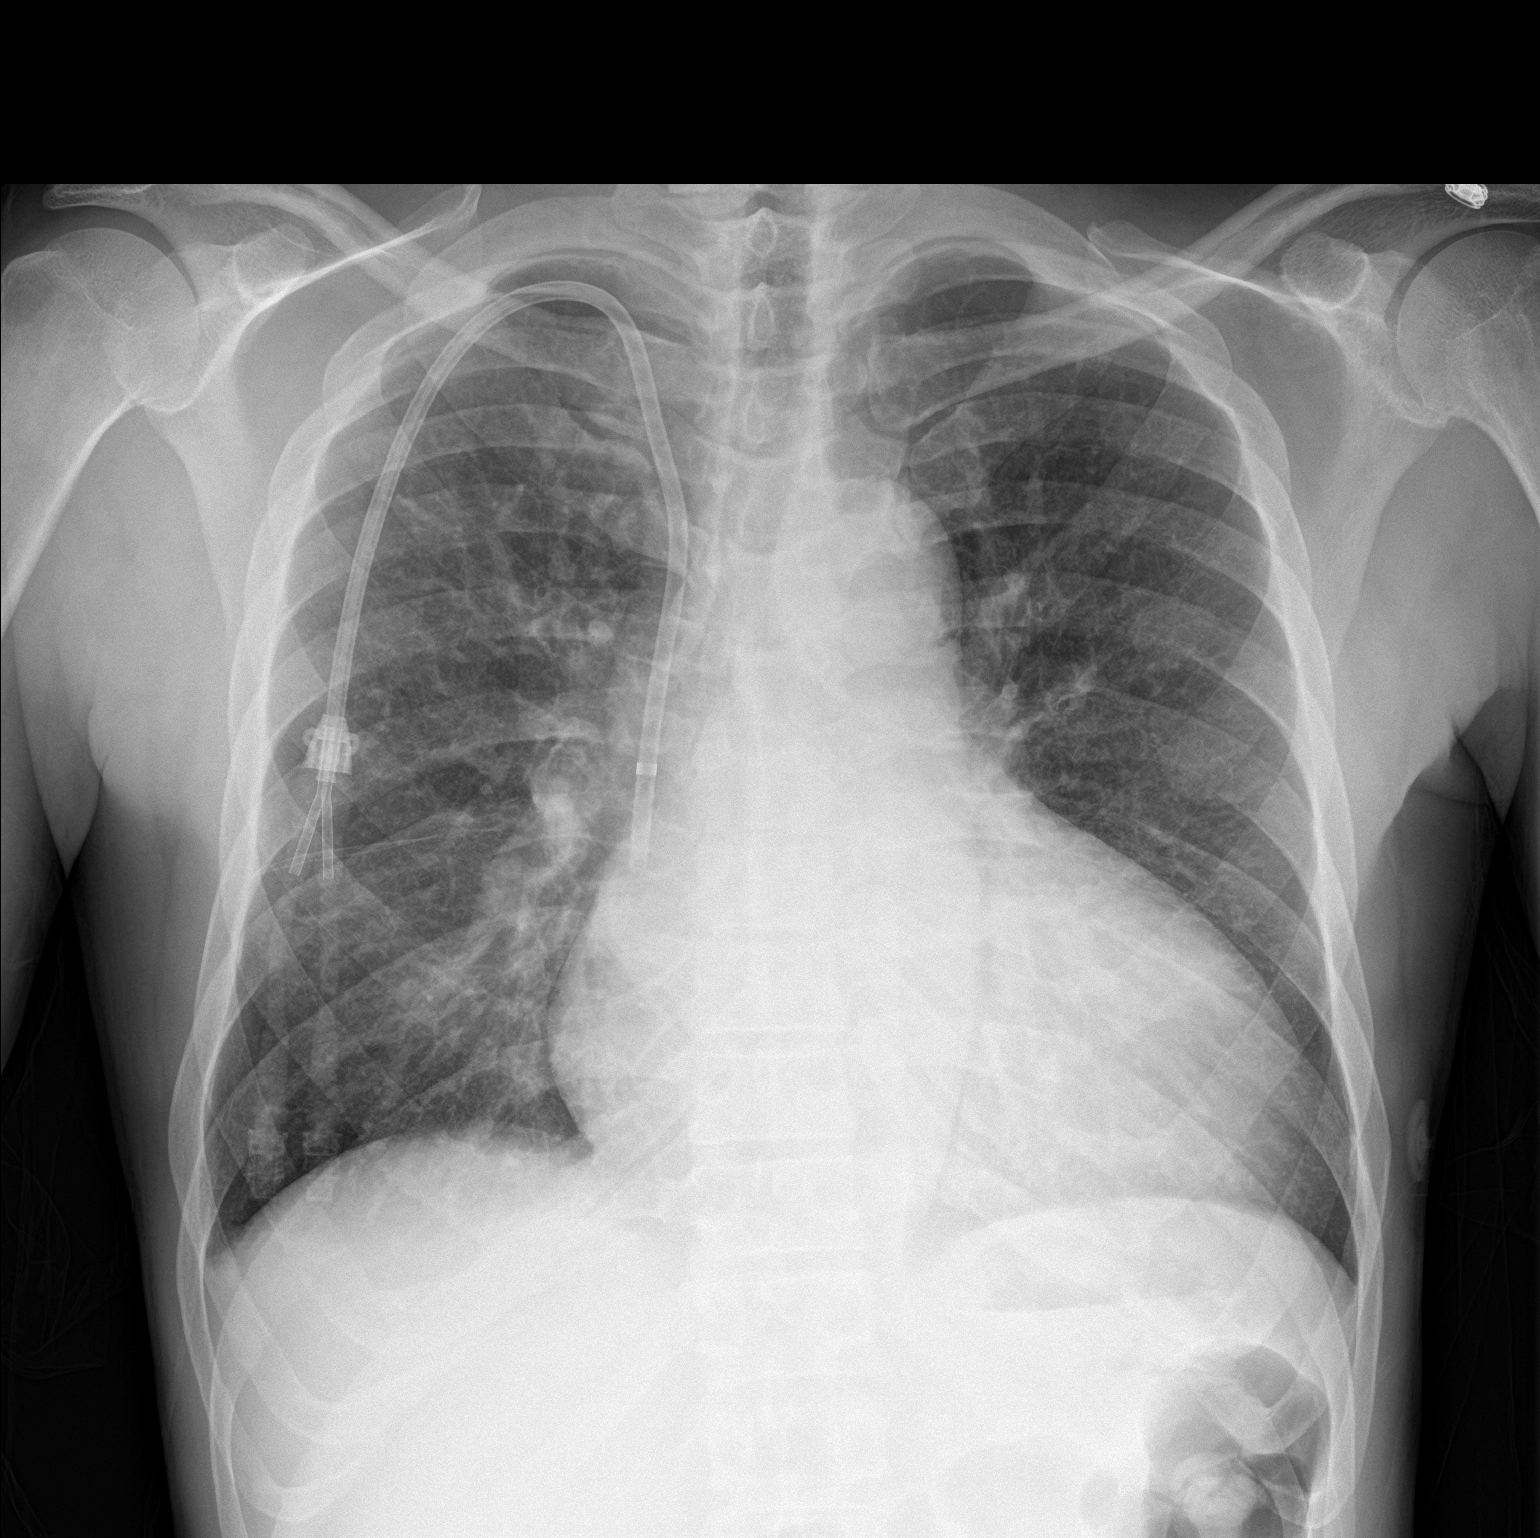

[chest lat]
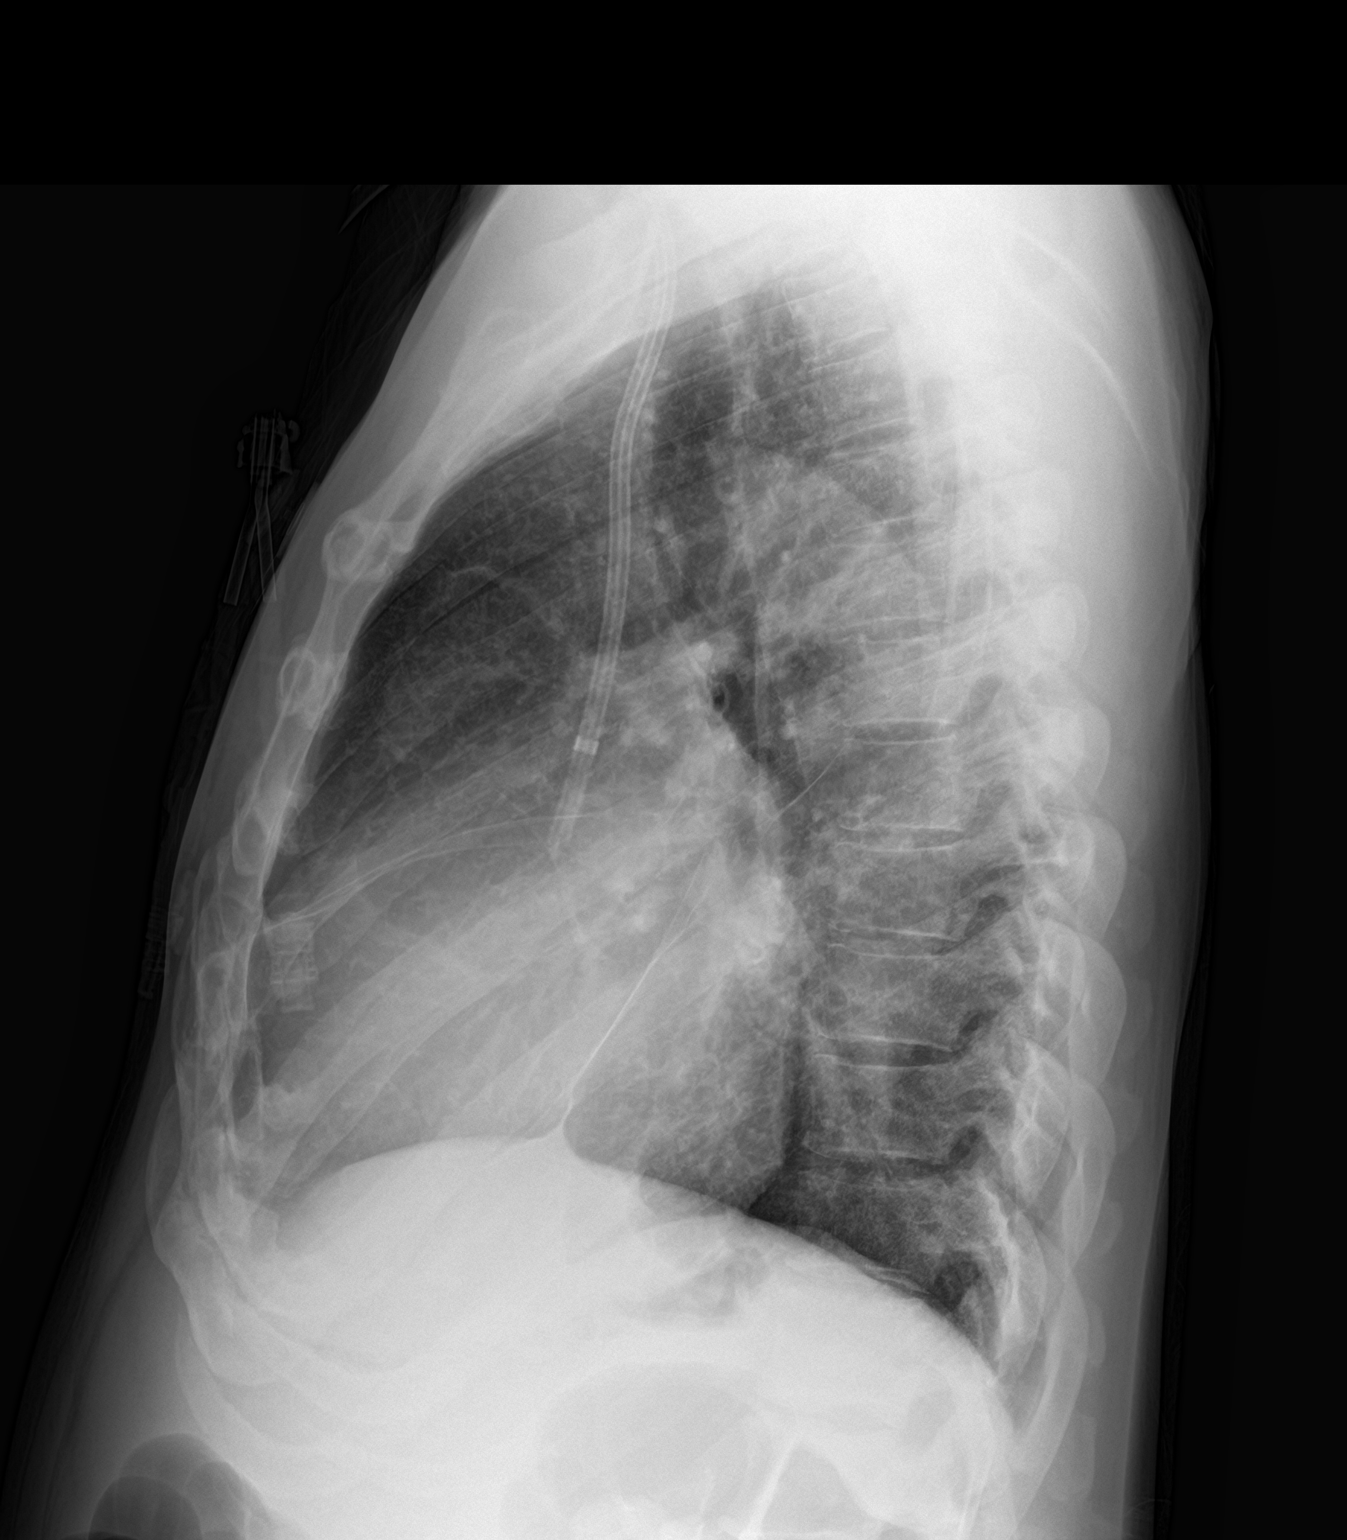

[2 of 2 positions shown; findings below may reference images not displayed]

FINDINGS: Mild to moderate enlargement of the cardiopericardial silhouette,
stable.

No mediastinal or hilar masses or evidence of adenopathy.

Right internal jugular tunneled dual lumen central venous catheter
is stable with its tip in the lower superior vena cava just above
the caval atrial junction.

Lungs are essentially clear. No evidence of pulmonary edema or
pneumonia. No pleural effusion or pneumothorax.

Skeletal structures are unremarkable.
IMPRESSION: 1. No acute cardiopulmonary disease.

## 2018-03-11 DIAGNOSIS — N186 End stage renal disease: Secondary | ICD-10-CM | POA: Diagnosis not present

## 2018-03-11 DIAGNOSIS — D631 Anemia in chronic kidney disease: Secondary | ICD-10-CM | POA: Diagnosis not present

## 2018-03-11 DIAGNOSIS — Z992 Dependence on renal dialysis: Secondary | ICD-10-CM | POA: Diagnosis not present

## 2018-03-11 DIAGNOSIS — D509 Iron deficiency anemia, unspecified: Secondary | ICD-10-CM | POA: Diagnosis not present

## 2018-03-11 DIAGNOSIS — N2581 Secondary hyperparathyroidism of renal origin: Secondary | ICD-10-CM | POA: Diagnosis not present

## 2018-03-12 ENCOUNTER — Other Ambulatory Visit (HOSPITAL_COMMUNITY): Payer: Self-pay | Admitting: Nephrology

## 2018-03-12 DIAGNOSIS — J90 Pleural effusion, not elsewhere classified: Secondary | ICD-10-CM

## 2018-03-13 ENCOUNTER — Ambulatory Visit (HOSPITAL_COMMUNITY)
Admission: RE | Admit: 2018-03-13 | Discharge: 2018-03-13 | Disposition: A | Discharge status: Home / Self Care | Payer: Medicare Other | Source: Ambulatory Visit | Attending: Nephrology | Admitting: Nephrology

## 2018-03-13 ENCOUNTER — Ambulatory Visit (HOSPITAL_COMMUNITY)
Admission: RE | Admit: 2018-03-13 | Discharge: 2018-03-13 | Disposition: A | Discharge status: Home / Self Care | Payer: Medicare Other | Source: Ambulatory Visit | Attending: Physician Assistant | Admitting: Physician Assistant

## 2018-03-13 DIAGNOSIS — D509 Iron deficiency anemia, unspecified: Secondary | ICD-10-CM | POA: Diagnosis not present

## 2018-03-13 DIAGNOSIS — J90 Pleural effusion, not elsewhere classified: Secondary | ICD-10-CM | POA: Diagnosis not present

## 2018-03-13 DIAGNOSIS — J811 Chronic pulmonary edema: Secondary | ICD-10-CM | POA: Diagnosis not present

## 2018-03-13 DIAGNOSIS — D631 Anemia in chronic kidney disease: Secondary | ICD-10-CM | POA: Diagnosis not present

## 2018-03-13 DIAGNOSIS — Z992 Dependence on renal dialysis: Secondary | ICD-10-CM | POA: Diagnosis not present

## 2018-03-13 DIAGNOSIS — I517 Cardiomegaly: Secondary | ICD-10-CM | POA: Diagnosis not present

## 2018-03-13 DIAGNOSIS — R918 Other nonspecific abnormal finding of lung field: Secondary | ICD-10-CM | POA: Diagnosis not present

## 2018-03-13 DIAGNOSIS — N186 End stage renal disease: Secondary | ICD-10-CM | POA: Diagnosis not present

## 2018-03-13 DIAGNOSIS — N2581 Secondary hyperparathyroidism of renal origin: Secondary | ICD-10-CM | POA: Diagnosis not present

## 2018-03-13 MED ORDER — LIDOCAINE HCL 1 % IJ SOLN
INTRAMUSCULAR | Status: AC
  Filled 2018-03-13: qty 10

## 2018-03-13 NOTE — Procedures (Signed)
PROCEDURE SUMMARY:  Successful image-guided right thoracentesis. Yielded 1 liters of golden fluid. Patient tolerated procedure well. No immediate complications.  Specimen was not sent for labs. CXR ordered.  Joaquim Nam PA-C 03/13/2018 11:14 AM

## 2018-03-16 ENCOUNTER — Encounter: Payer: Self-pay | Admitting: Internal Medicine

## 2018-03-16 ENCOUNTER — Other Ambulatory Visit: Payer: Self-pay

## 2018-03-16 ENCOUNTER — Non-Acute Institutional Stay (SKILLED_NURSING_FACILITY): Payer: Medicare Other | Admitting: Internal Medicine

## 2018-03-16 DIAGNOSIS — D631 Anemia in chronic kidney disease: Secondary | ICD-10-CM | POA: Diagnosis not present

## 2018-03-16 DIAGNOSIS — L219 Seborrheic dermatitis, unspecified: Secondary | ICD-10-CM | POA: Diagnosis not present

## 2018-03-16 DIAGNOSIS — N186 End stage renal disease: Secondary | ICD-10-CM

## 2018-03-16 DIAGNOSIS — Z9115 Patient's noncompliance with renal dialysis: Secondary | ICD-10-CM

## 2018-03-16 DIAGNOSIS — D509 Iron deficiency anemia, unspecified: Secondary | ICD-10-CM | POA: Diagnosis not present

## 2018-03-16 DIAGNOSIS — R059 Cough, unspecified: Secondary | ICD-10-CM

## 2018-03-16 DIAGNOSIS — G4701 Insomnia due to medical condition: Secondary | ICD-10-CM

## 2018-03-16 DIAGNOSIS — N2581 Secondary hyperparathyroidism of renal origin: Secondary | ICD-10-CM | POA: Diagnosis not present

## 2018-03-16 DIAGNOSIS — F4323 Adjustment disorder with mixed anxiety and depressed mood: Secondary | ICD-10-CM | POA: Diagnosis not present

## 2018-03-16 DIAGNOSIS — Z992 Dependence on renal dialysis: Secondary | ICD-10-CM | POA: Diagnosis not present

## 2018-03-16 DIAGNOSIS — R05 Cough: Secondary | ICD-10-CM | POA: Diagnosis not present

## 2018-03-16 MED ORDER — ZOLPIDEM TARTRATE 5 MG PO TABS: 5.0000 mg | ORAL_TABLET | Freq: Every day | ORAL | 0 refills | Status: DC
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
## 2018-03-16 NOTE — Telephone Encounter (Signed)
Rx faxed to [facility] (P) [phone], [phone]

## 2018-03-17 ENCOUNTER — Encounter: Payer: Self-pay | Admitting: Adult Health

## 2018-03-17 ENCOUNTER — Other Ambulatory Visit: Payer: Self-pay

## 2018-03-17 ENCOUNTER — Non-Acute Institutional Stay (SKILLED_NURSING_FACILITY): Payer: Medicare Other | Admitting: Adult Health

## 2018-03-17 DIAGNOSIS — I132 Hypertensive heart and chronic kidney disease with heart failure and with stage 5 chronic kidney disease, or end stage renal disease: Secondary | ICD-10-CM

## 2018-03-17 DIAGNOSIS — J9621 Acute and chronic respiratory failure with hypoxia: Secondary | ICD-10-CM | POA: Diagnosis not present

## 2018-03-17 DIAGNOSIS — Z992 Dependence on renal dialysis: Secondary | ICD-10-CM | POA: Diagnosis not present

## 2018-03-17 DIAGNOSIS — N185 Chronic kidney disease, stage 5: Secondary | ICD-10-CM

## 2018-03-17 DIAGNOSIS — N186 End stage renal disease: Secondary | ICD-10-CM | POA: Diagnosis not present

## 2018-03-17 DIAGNOSIS — I5020 Unspecified systolic (congestive) heart failure: Secondary | ICD-10-CM

## 2018-03-17 MED ORDER — ZOLPIDEM TARTRATE 5 MG PO TABS: 5.0000 mg | ORAL_TABLET | Freq: Every day | ORAL | 0 refills | Status: DC

## 2018-03-17 NOTE — Telephone Encounter (Signed)
Discharged with patient
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
## 2018-03-18 DIAGNOSIS — N186 End stage renal disease: Secondary | ICD-10-CM | POA: Diagnosis not present

## 2018-03-18 DIAGNOSIS — N2581 Secondary hyperparathyroidism of renal origin: Secondary | ICD-10-CM | POA: Diagnosis not present

## 2018-03-18 DIAGNOSIS — D509 Iron deficiency anemia, unspecified: Secondary | ICD-10-CM | POA: Diagnosis not present

## 2018-03-18 DIAGNOSIS — D631 Anemia in chronic kidney disease: Secondary | ICD-10-CM | POA: Diagnosis not present

## 2018-03-18 DIAGNOSIS — Z992 Dependence on renal dialysis: Secondary | ICD-10-CM | POA: Diagnosis not present

## 2018-03-18 DIAGNOSIS — Z86718 Personal history of other venous thrombosis and embolism: Secondary | ICD-10-CM | POA: Diagnosis not present

## 2018-03-19 IMAGING — CR DG ABDOMEN ACUTE W/ 1V CHEST
3 series · 3 of 3 positions shown · non-contrast
Comparison: 10/19/2015

CLINICAL DATA: Nausea and vomiting for several days.

EXAM:
DG ABDOMEN ACUTE W/ 1V CHEST

[chest pa]
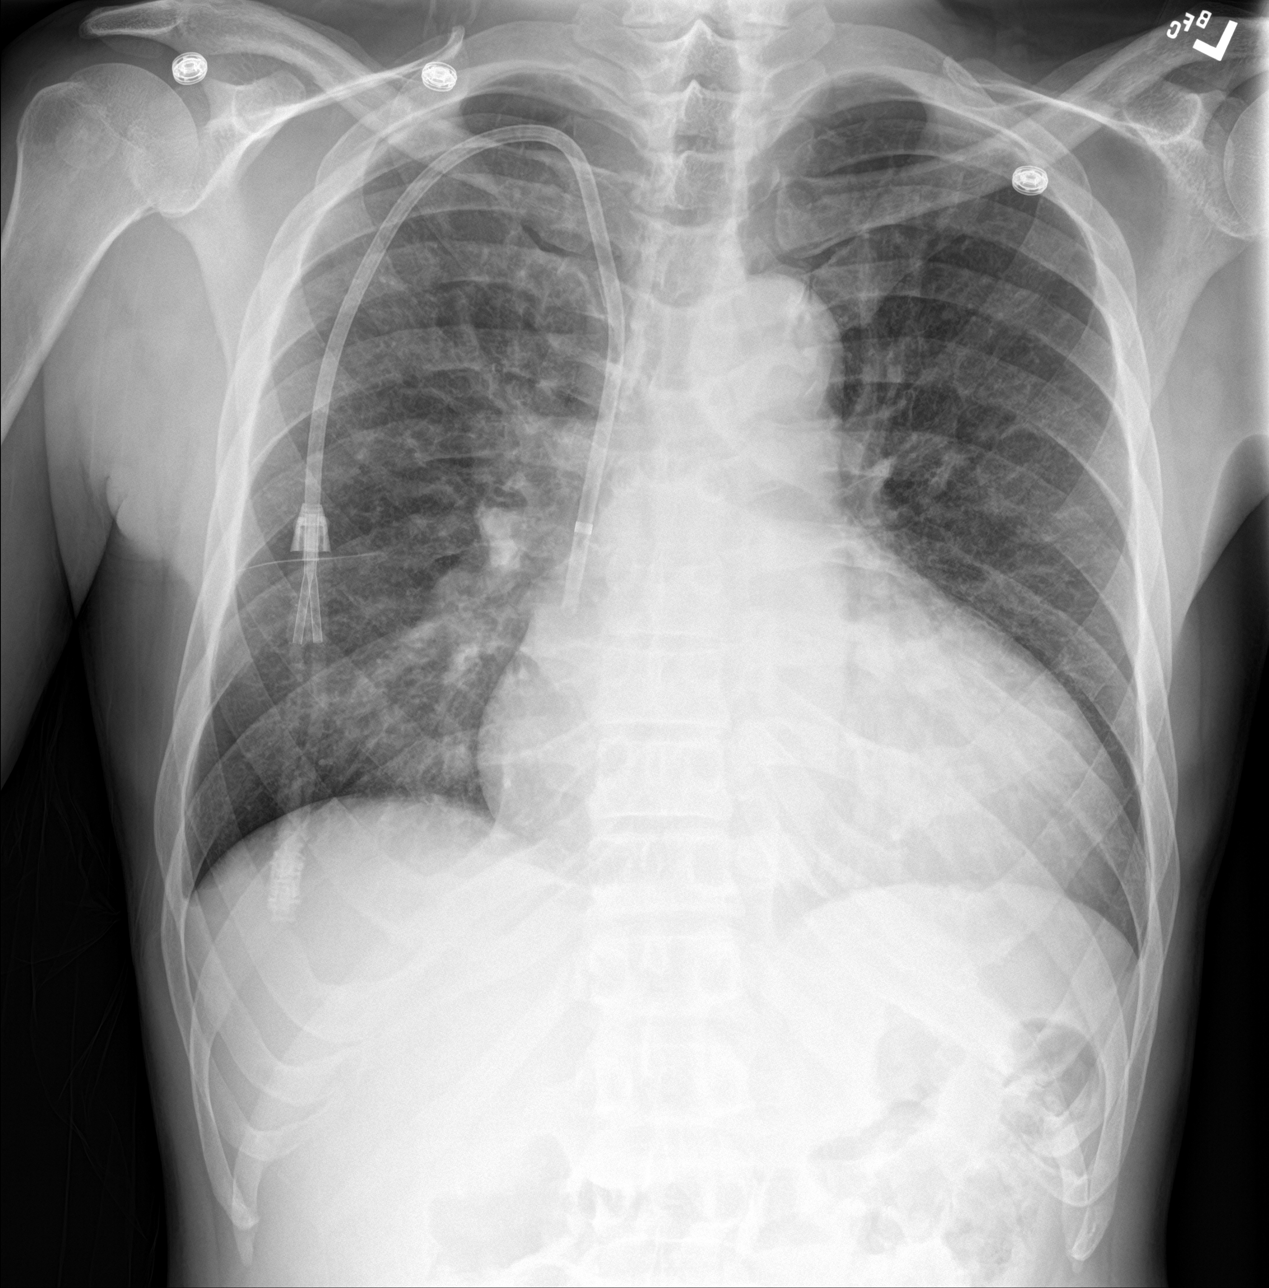

[abdomen erect]
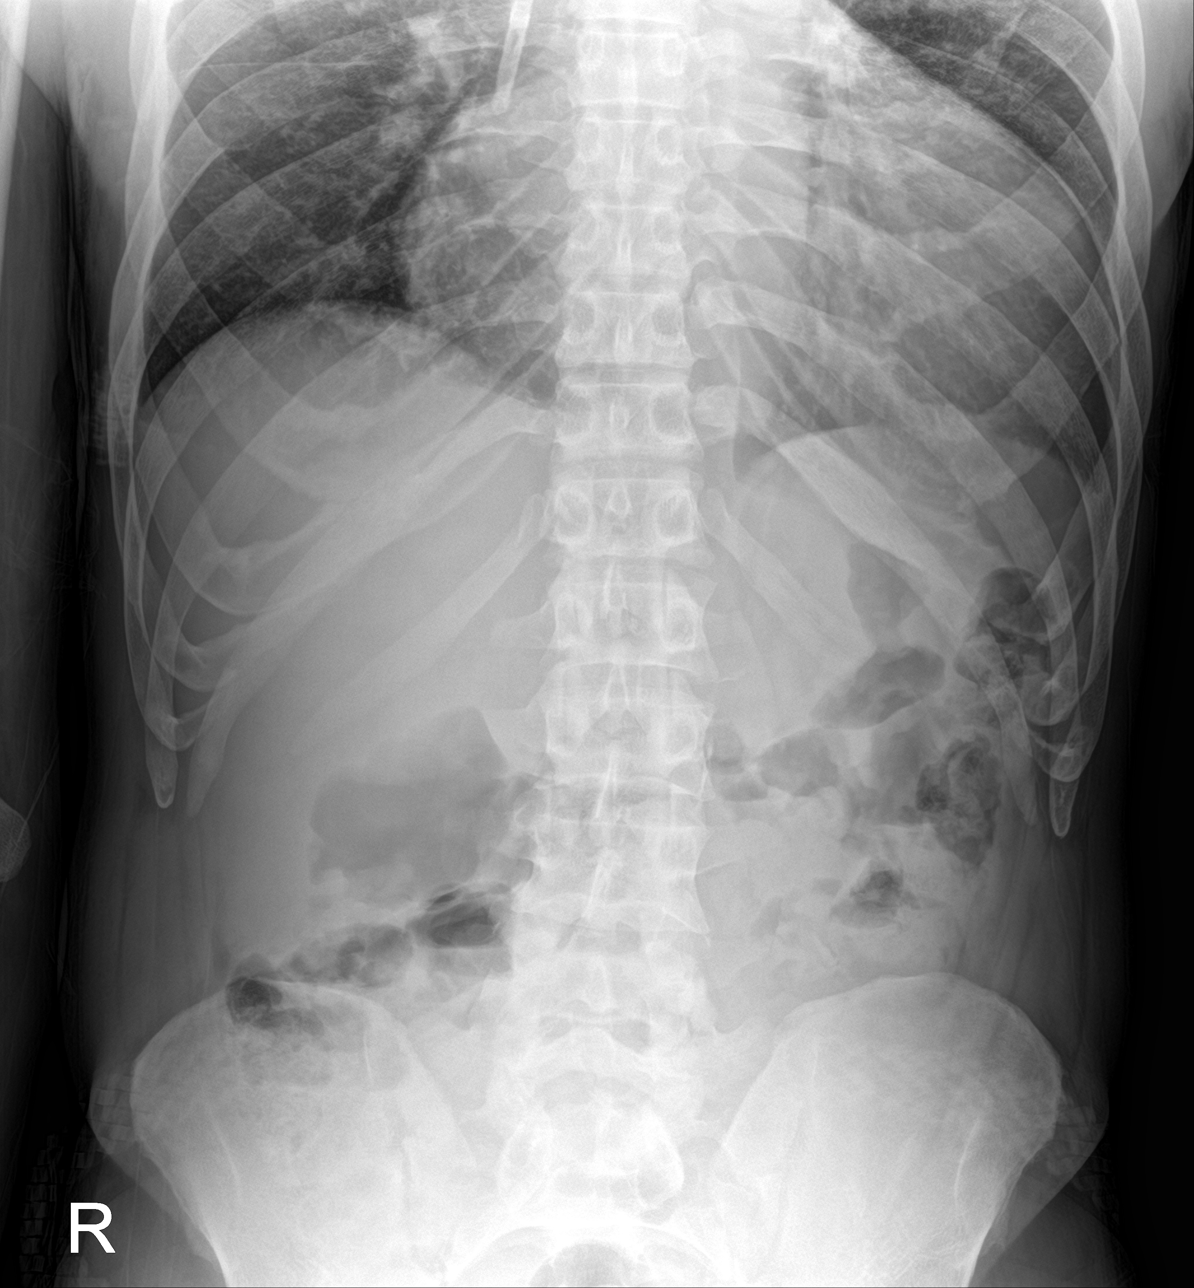

[abdomen supine]
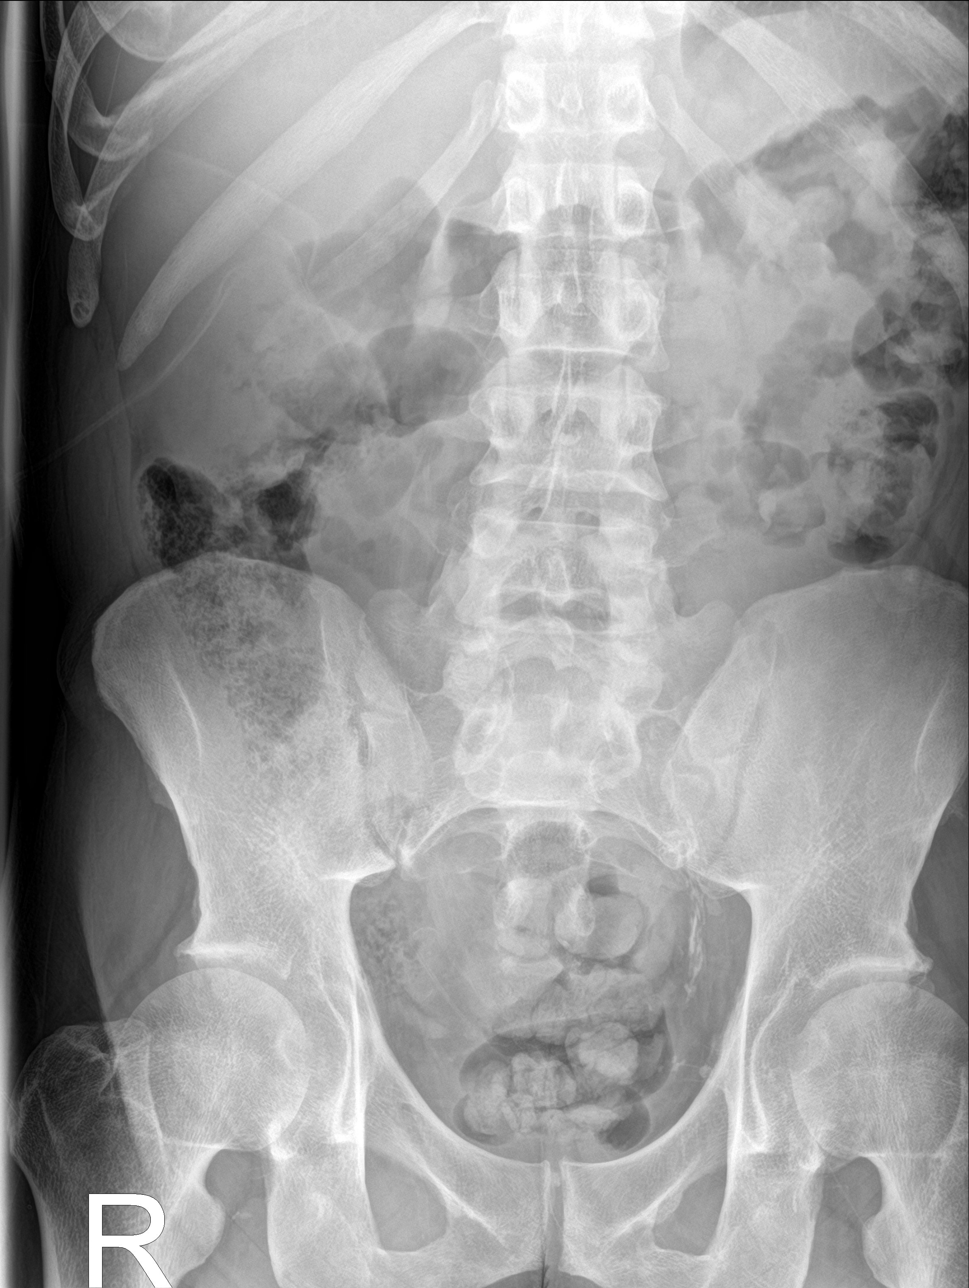

[3 of 3 positions shown; findings below may reference images not displayed]

FINDINGS: There is a right jugular central line with tip in the low SVC. There
is moderate unchanged cardiomegaly. Mild chronic appearing
interstitial coarsening is present, unchanged. No consolidation. No
large effusion.

The abdominal gas pattern is negative for obstruction or
perforation. There is a generous volume of stool throughout the
colon. No biliary or urinary calculi are evident.
IMPRESSION: Negative abdominal radiographs. No acute cardiopulmonary disease.
Unchanged cardiomegaly.

## 2018-03-20 DIAGNOSIS — D509 Iron deficiency anemia, unspecified: Secondary | ICD-10-CM | POA: Diagnosis not present

## 2018-03-20 DIAGNOSIS — N2581 Secondary hyperparathyroidism of renal origin: Secondary | ICD-10-CM | POA: Diagnosis not present

## 2018-03-20 DIAGNOSIS — Z992 Dependence on renal dialysis: Secondary | ICD-10-CM | POA: Diagnosis not present

## 2018-03-20 DIAGNOSIS — D631 Anemia in chronic kidney disease: Secondary | ICD-10-CM | POA: Diagnosis not present

## 2018-03-20 DIAGNOSIS — N186 End stage renal disease: Secondary | ICD-10-CM | POA: Diagnosis not present

## 2018-03-20 IMAGING — CT CT ABDOMEN WO/W CM
2 of 10 series · 11 of 46 positions shown, 17 images · IV contrast (Iodine)
Comparison: 10/30/2015 and 03/11/2015.

CLINICAL DATA: Left renal mass, abdominal pain for 2 days with
nausea and vomiting.

EXAM:
CT ABDOMEN AND PELVIS WITHOUT AND WITH CONTRAST
TECHNIQUE: Multidetector CT imaging of the abdomen and pelvis was performed
following the standard protocol before and following the bolus
administration of intravenous contrast.
CONTRAST:  100 cc [id number].

[Series 201: renal wo, idose (2) · axial · 0.68mm/px · z∈[+642,+847]mm · 9 of 104 slices shown, 15 images]
[im 11/104  soft-tissue]
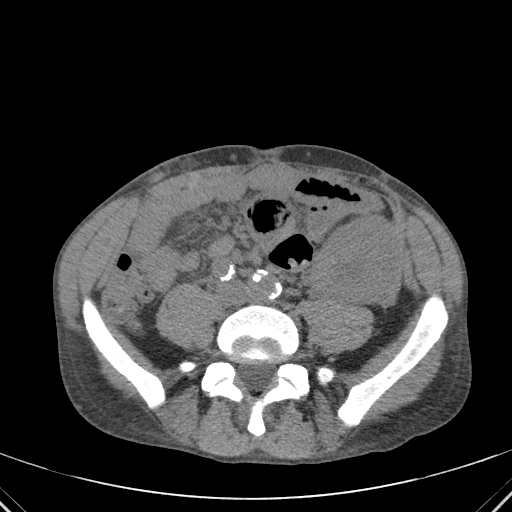
[im 11/104  bone]
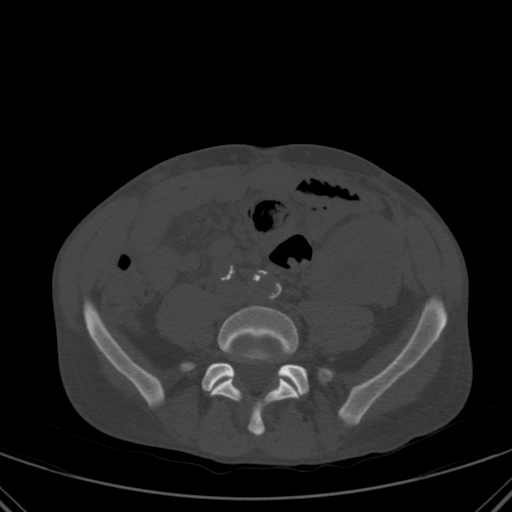
[im 21/104  soft-tissue]
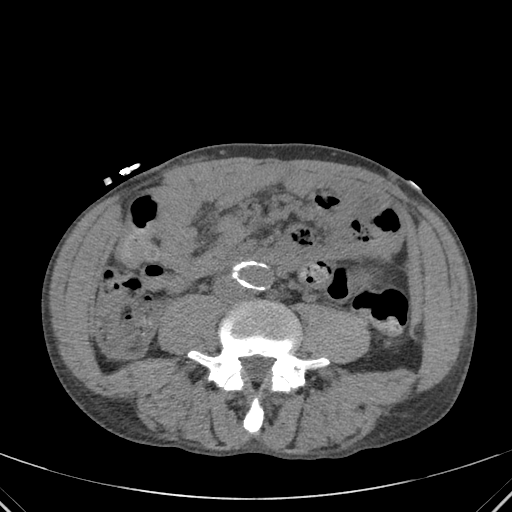
[im 31/104  soft-tissue]
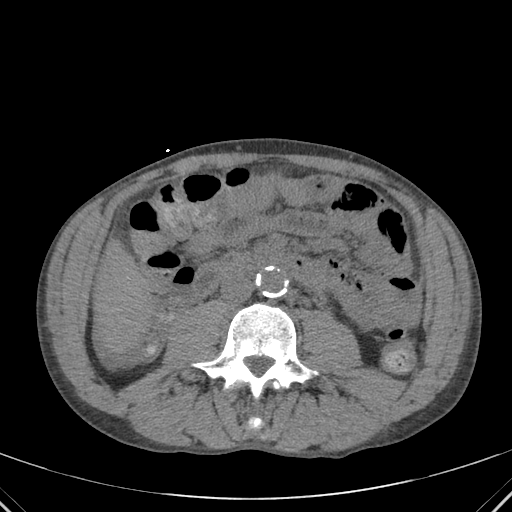
[im 42/104  soft-tissue]
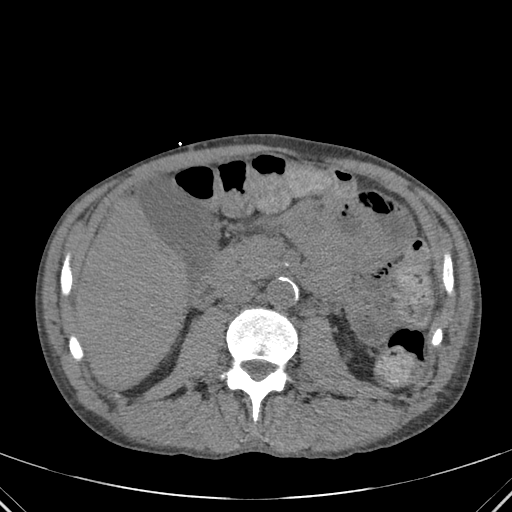
[im 52/104  soft-tissue]
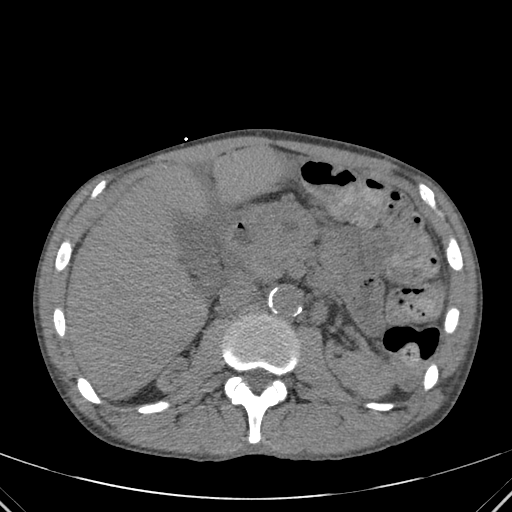
[im 62/104  soft-tissue]
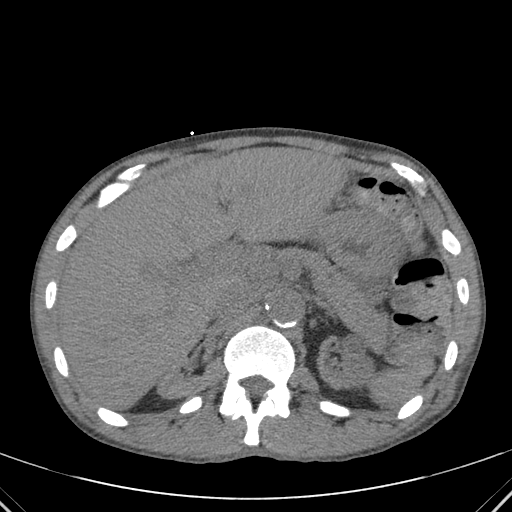
[im 62/104  lung]
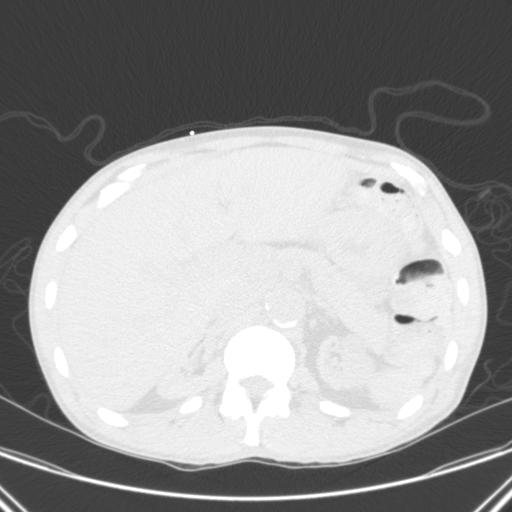
[im 73/104  soft-tissue]
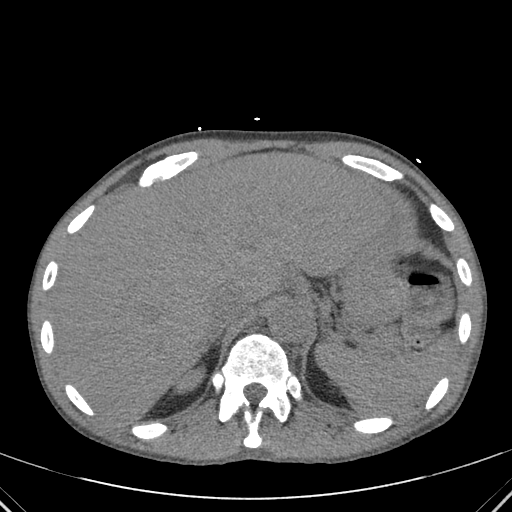
[im 73/104  lung]
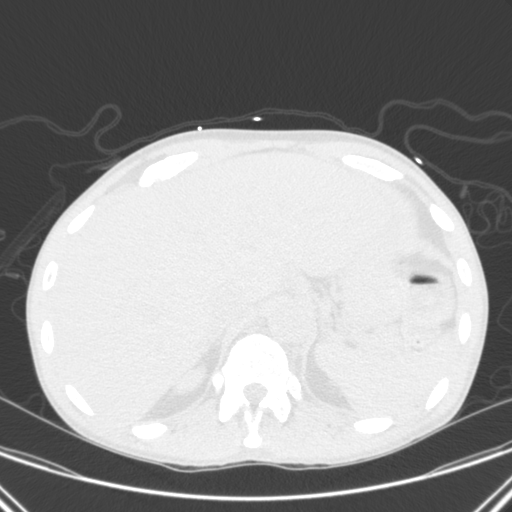
[im 83/104  soft-tissue]
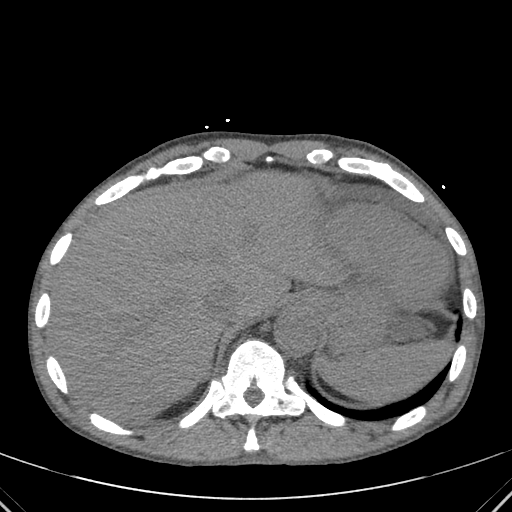
[im 83/104  lung]
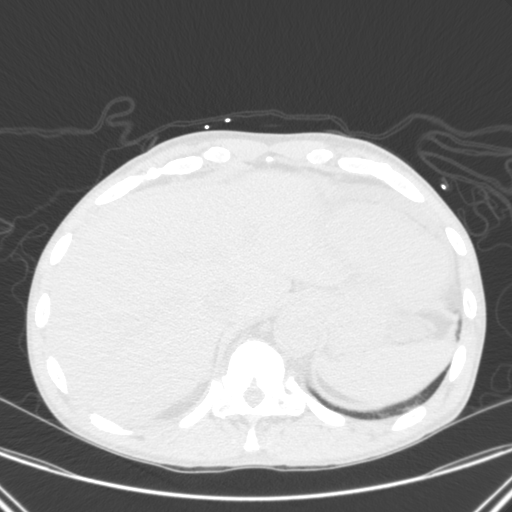
[im 93/104  soft-tissue]
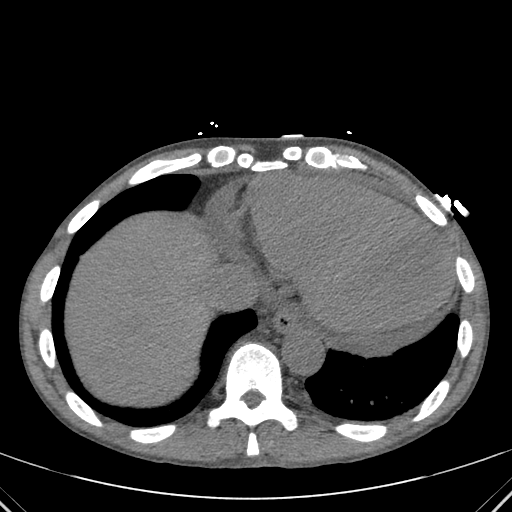
[im 93/104  lung]
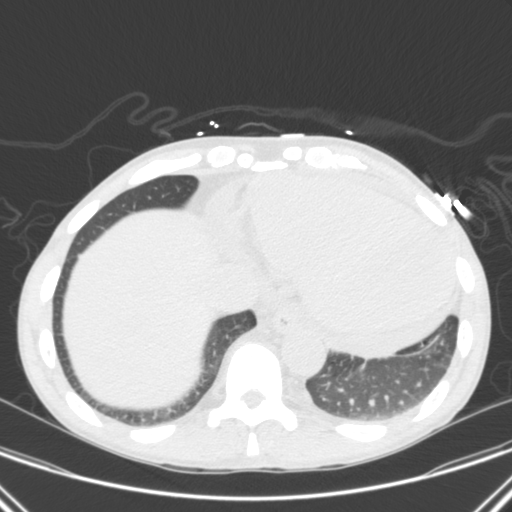
[im 93/104  bone]
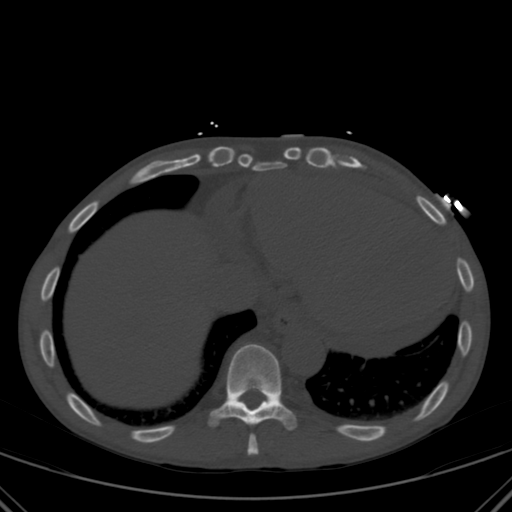

[Series 202: coronal wo, idose (2) · coronal · 0.45mm/px · 2 of 85 slices shown]
[im 29/85  soft-tissue]
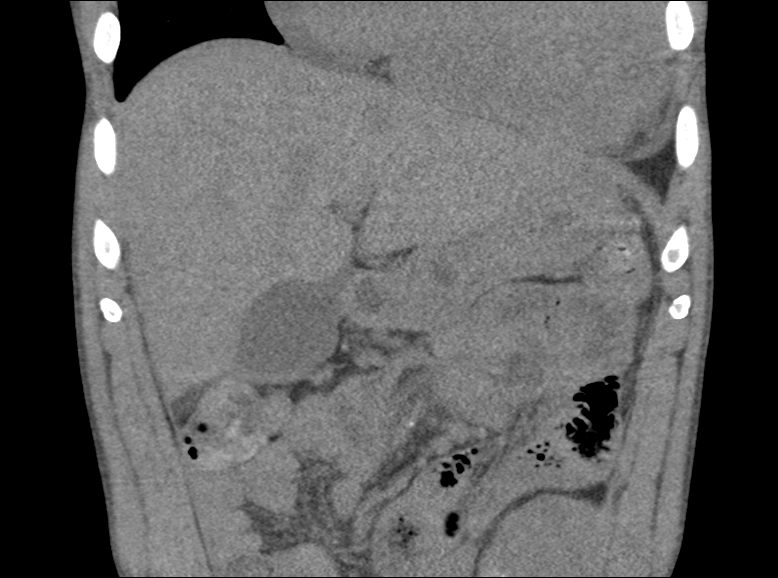
[im 57/85  soft-tissue]
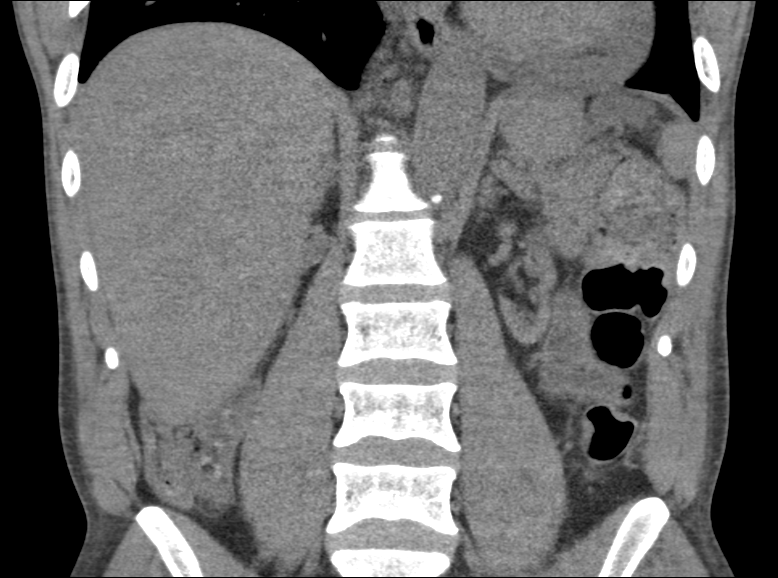

[11 of 46 positions shown; findings below may reference images not displayed]

FINDINGS: Lower chest: Lung bases show no acute findings. Heart is moderately
enlarged. Small pericardial effusion. No pericardial effusion.

Hepatobiliary: Liver is heterogeneous. Gallbladder is unremarkable.
No biliary ductal dilatation.

Pancreas: Negative.

Spleen: Negative.

Adrenals/Urinary Tract: Adrenal glands are unremarkable. Native
kidneys are atrophic. Heterogeneous mass off the interpolar left
kidney measures 3.5 cm (401/54), previously 1.9 cm on 03/11/2015. It
measures 33 Hounsfield units on precontrast imaging and 53
Hounsfield units on nephrographic phase imaging. Enhancement
characteristics are most likely stunted by renal failure. Additional
low-attenuation lesions in the kidneys measure up to 1.4 cm on the
right, too small to characterize. Left renal transplant is partially
imaged in the left iliac fossa.

Stomach/Bowel: Stomach and visualized portions of the small bowel
and colon are grossly unremarkable.

Vascular/Lymphatic: Atherosclerotic calcification of the arterial
vasculature without abdominal aortic aneurysm. Scattered lymph nodes
are not enlarged by CT size criteria.

Other: No free fluid. Mesenteries and peritoneum are grossly
unremarkable.

Musculoskeletal: No worrisome lytic or sclerotic lesions.
IMPRESSION: 1. Enlarging left renal mass with mild enhancement, worrisome for
renal cell carcinoma. Please see discussion above.
2. Small pericardial effusion.
3. Heterogeneity within the liver parenchyma may be due to passive
congestion related to impaired cardiac function.

## 2018-03-20 IMAGING — CT CT RENAL STONE PROTOCOL
2 of 4 series · 12 of 46 positions shown, 14 images · non-contrast
Comparison: CT abdomen pelvis 03/11/2015

CLINICAL DATA: Back pain. Nausea vomiting 2 days. Dialysis patient.

EXAM:
CT ABDOMEN AND PELVIS WITHOUT CONTRAST
TECHNIQUE: Multidetector CT imaging of the abdomen and pelvis was performed
following the standard protocol without IV contrast.

[Series 301: stone study, idose (2) · axial · 0.68mm/px · z∈[-735,-385]mm · 9 of 85 slices shown, 11 images]
[im 8/85  soft-tissue]
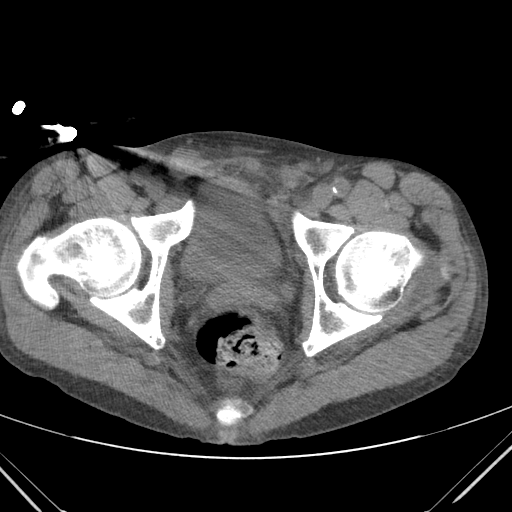
[im 8/85  bone]
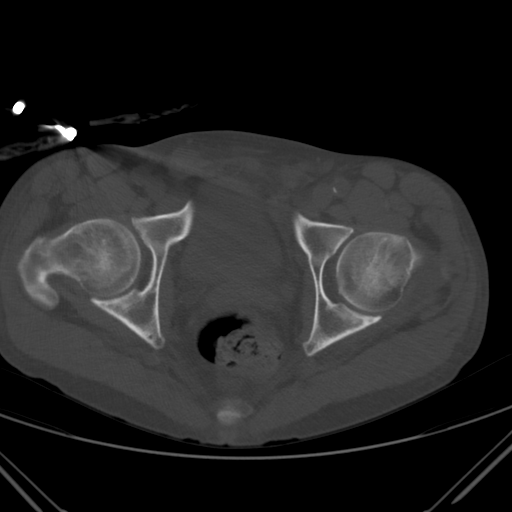
[im 15/85  soft-tissue]
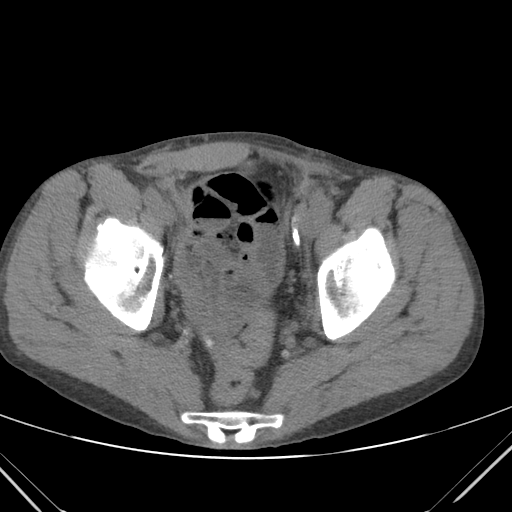
[im 25/85  soft-tissue]
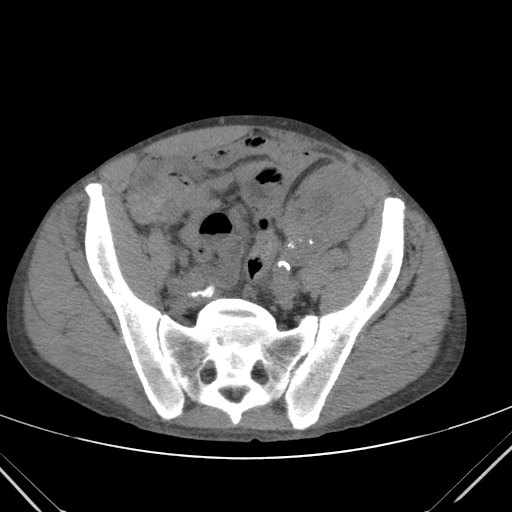
[im 32/85  soft-tissue]
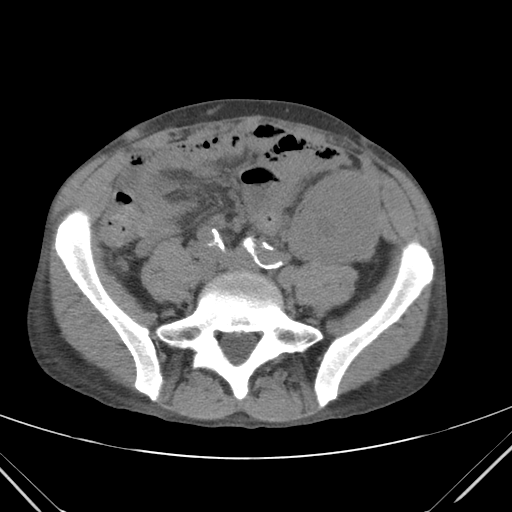
[im 43/85  soft-tissue]
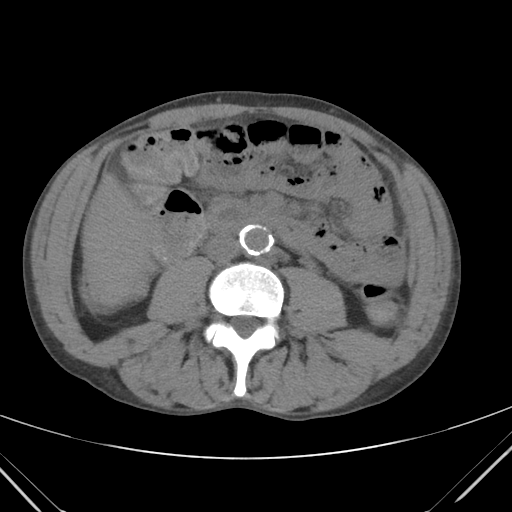
[im 53/85  soft-tissue]
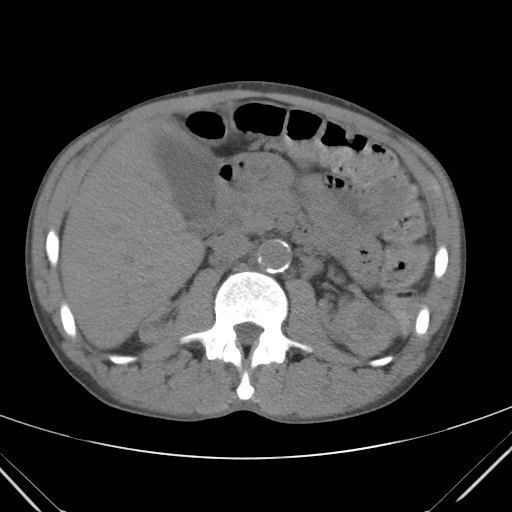
[im 60/85  soft-tissue]
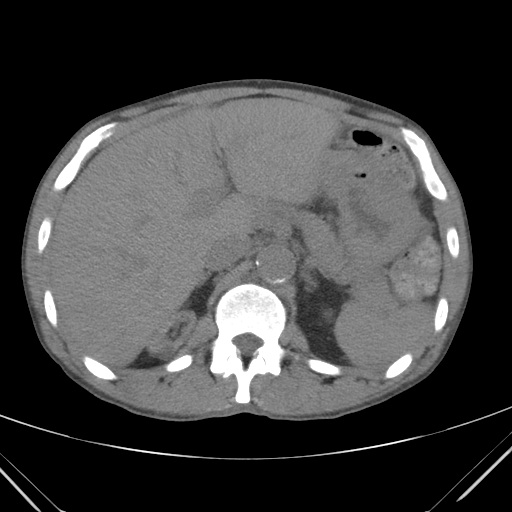
[im 71/85  soft-tissue]
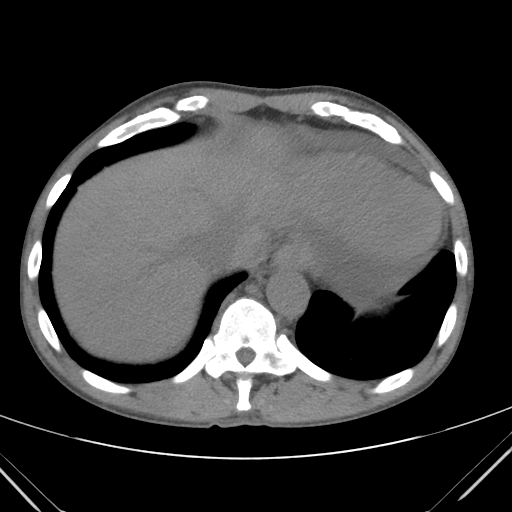
[im 78/85  soft-tissue]
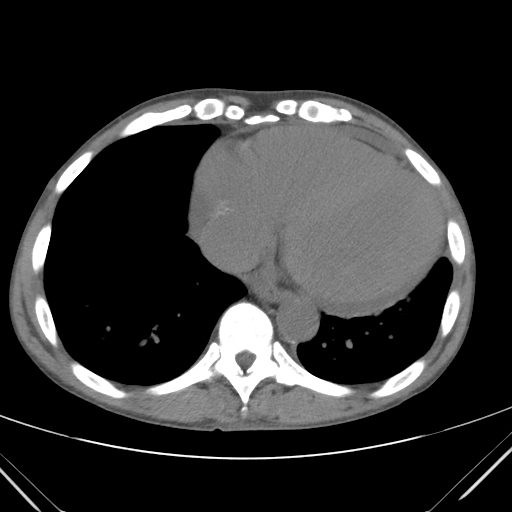
[im 78/85  bone]
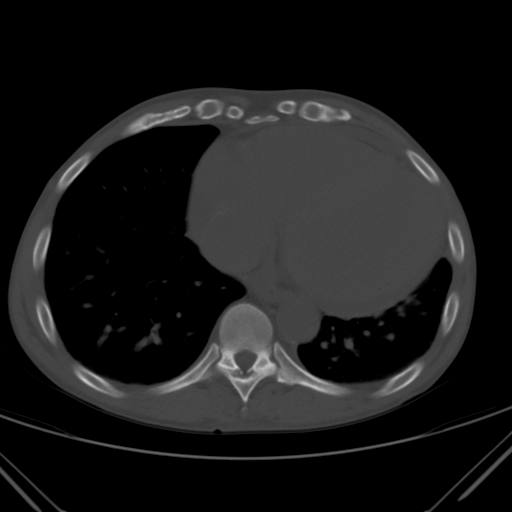

[Series 303: coronals, idose (2) · coronal · 0.45mm/px · 3 of 107 slices shown]
[im 36/107  soft-tissue]
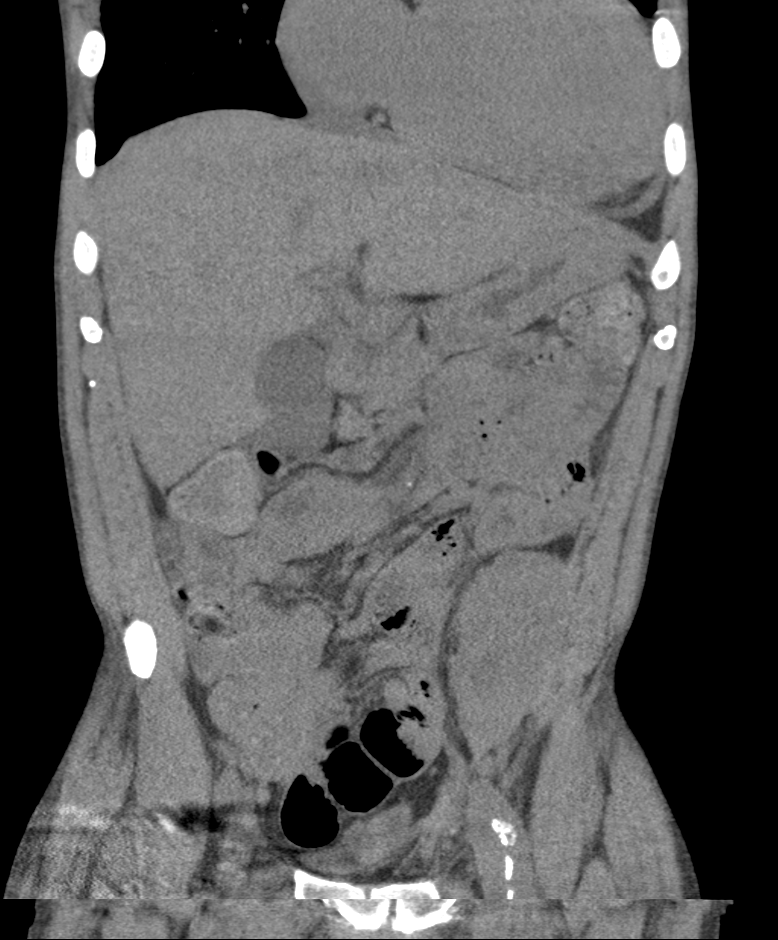
[im 48/107  soft-tissue]
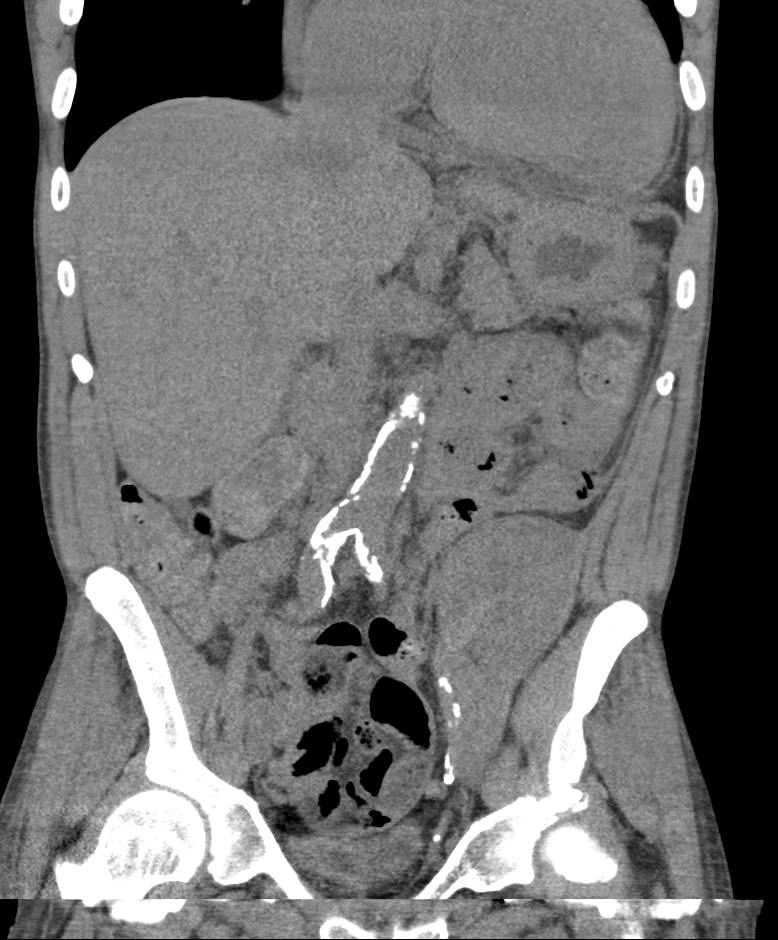
[im 59/107  soft-tissue]
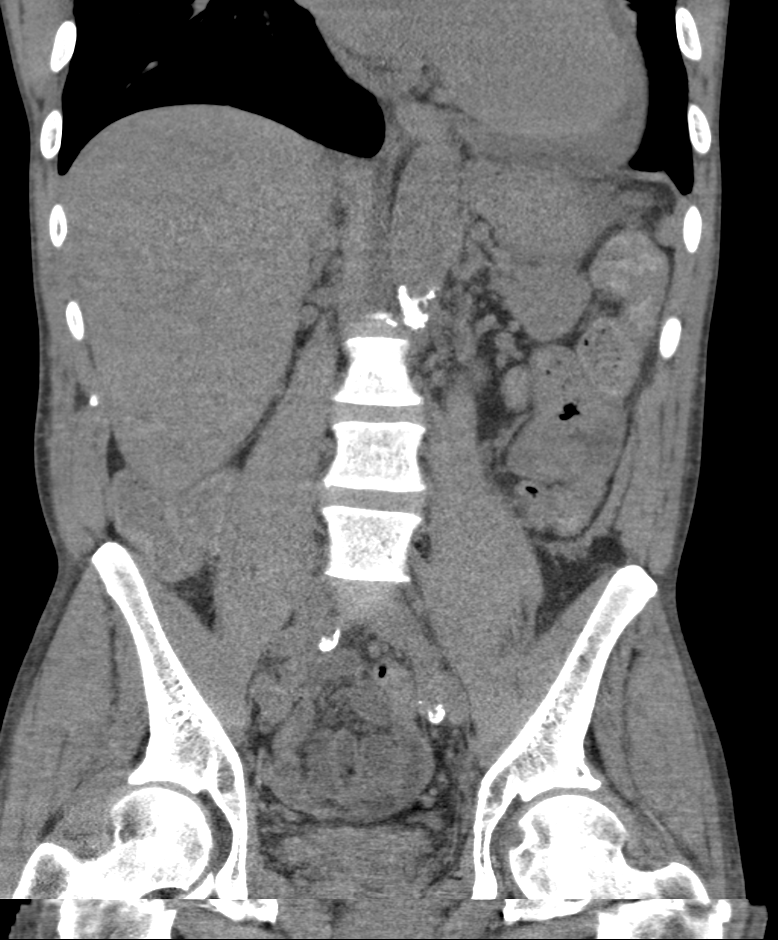

[12 of 46 positions shown; findings below may reference images not displayed]

FINDINGS: Lower chest: Cardiac enlargement with moderate pericardial effusion.
Lung bases clear

Hepatobiliary: The liver is mildly enlarged. No focal liver lesion.
Gallbladder and bile ducts normal.

Pancreas: Negative

Spleen: Negative

Adrenals/Urinary Tract: Left midpole renal mass measures 39 x 42 mm
and shows significant interval growth since the prior study. Limited
evaluation without intravenous contrast however this appears to be a
solid lesion and is likely renal cell carcinoma. Atrophy of both
kidneys without hydronephrosis. No renal calculi. Urinary bladder is
collapsed.

Renal transplant in the left pelvis unchanged from the prior study.
Limited evaluation without intravenous contrast.

Stomach/Bowel: Negative for bowel obstruction. No bowel edema or
mass. Appendix not well seen but no evidence of appendicitis.
Negative for diverticulitis.

Vascular/Lymphatic: Advanced atherosclerotic calcification in the
aorta and iliac arteries. No aneurysm. No lymphadenopathy.

Reproductive: Negative

Other: Negative for ascites.

Musculoskeletal: Negative
IMPRESSION: Left renal mass has enlarged since the prior CT and is consistent
with carcinoma.

Renal transplant left lower quadrant with limited evaluation without
intravenous contrast.

Moderate pericardial effusion

## 2018-03-22 DIAGNOSIS — N186 End stage renal disease: Secondary | ICD-10-CM | POA: Diagnosis not present

## 2018-03-22 DIAGNOSIS — Z992 Dependence on renal dialysis: Secondary | ICD-10-CM | POA: Diagnosis not present

## 2018-03-22 DIAGNOSIS — T8610 Unspecified complication of kidney transplant: Secondary | ICD-10-CM | POA: Diagnosis not present

## 2018-03-23 DIAGNOSIS — D631 Anemia in chronic kidney disease: Secondary | ICD-10-CM | POA: Diagnosis not present

## 2018-03-23 DIAGNOSIS — D509 Iron deficiency anemia, unspecified: Secondary | ICD-10-CM | POA: Diagnosis not present

## 2018-03-23 DIAGNOSIS — N2581 Secondary hyperparathyroidism of renal origin: Secondary | ICD-10-CM | POA: Diagnosis not present

## 2018-03-23 DIAGNOSIS — Z992 Dependence on renal dialysis: Secondary | ICD-10-CM | POA: Diagnosis not present

## 2018-03-23 DIAGNOSIS — N186 End stage renal disease: Secondary | ICD-10-CM | POA: Diagnosis not present

## 2018-03-26 DIAGNOSIS — Z885 Allergy status to narcotic agent status: Secondary | ICD-10-CM | POA: Diagnosis not present

## 2018-03-26 DIAGNOSIS — J90 Pleural effusion, not elsewhere classified: Secondary | ICD-10-CM | POA: Diagnosis not present

## 2018-03-26 DIAGNOSIS — R112 Nausea with vomiting, unspecified: Secondary | ICD-10-CM | POA: Diagnosis not present

## 2018-03-26 DIAGNOSIS — I509 Heart failure, unspecified: Secondary | ICD-10-CM | POA: Diagnosis not present

## 2018-03-26 DIAGNOSIS — I517 Cardiomegaly: Secondary | ICD-10-CM | POA: Diagnosis not present

## 2018-03-26 DIAGNOSIS — R1013 Epigastric pain: Secondary | ICD-10-CM | POA: Diagnosis not present

## 2018-03-26 DIAGNOSIS — N186 End stage renal disease: Secondary | ICD-10-CM | POA: Diagnosis not present

## 2018-03-26 DIAGNOSIS — N2889 Other specified disorders of kidney and ureter: Secondary | ICD-10-CM | POA: Diagnosis not present

## 2018-03-26 DIAGNOSIS — Z79891 Long term (current) use of opiate analgesic: Secondary | ICD-10-CM | POA: Diagnosis not present

## 2018-03-26 DIAGNOSIS — Z79899 Other long term (current) drug therapy: Secondary | ICD-10-CM | POA: Diagnosis not present

## 2018-03-26 DIAGNOSIS — R160 Hepatomegaly, not elsewhere classified: Secondary | ICD-10-CM | POA: Diagnosis not present

## 2018-03-26 DIAGNOSIS — Z881 Allergy status to other antibiotic agents status: Secondary | ICD-10-CM | POA: Diagnosis not present

## 2018-03-26 DIAGNOSIS — Z886 Allergy status to analgesic agent status: Secondary | ICD-10-CM | POA: Diagnosis not present

## 2018-03-26 DIAGNOSIS — I132 Hypertensive heart and chronic kidney disease with heart failure and with stage 5 chronic kidney disease, or end stage renal disease: Secondary | ICD-10-CM | POA: Diagnosis not present

## 2018-03-26 DIAGNOSIS — J9811 Atelectasis: Secondary | ICD-10-CM | POA: Diagnosis not present

## 2018-03-26 DIAGNOSIS — R9431 Abnormal electrocardiogram [ECG] [EKG]: Secondary | ICD-10-CM | POA: Diagnosis not present

## 2018-03-26 DIAGNOSIS — I313 Pericardial effusion (noninflammatory): Secondary | ICD-10-CM | POA: Diagnosis not present

## 2018-03-26 DIAGNOSIS — Z7901 Long term (current) use of anticoagulants: Secondary | ICD-10-CM | POA: Diagnosis not present

## 2018-03-26 DIAGNOSIS — I110 Hypertensive heart disease with heart failure: Secondary | ICD-10-CM | POA: Diagnosis not present

## 2018-03-26 DIAGNOSIS — Z992 Dependence on renal dialysis: Secondary | ICD-10-CM | POA: Diagnosis not present

## 2018-03-26 DIAGNOSIS — R0602 Shortness of breath: Secondary | ICD-10-CM | POA: Diagnosis not present

## 2018-03-26 DIAGNOSIS — R918 Other nonspecific abnormal finding of lung field: Secondary | ICD-10-CM | POA: Diagnosis not present

## 2018-03-26 DIAGNOSIS — R109 Unspecified abdominal pain: Secondary | ICD-10-CM | POA: Diagnosis not present

## 2018-03-26 DIAGNOSIS — I120 Hypertensive chronic kidney disease with stage 5 chronic kidney disease or end stage renal disease: Secondary | ICD-10-CM | POA: Diagnosis not present

## 2018-03-28 DIAGNOSIS — I517 Cardiomegaly: Secondary | ICD-10-CM | POA: Diagnosis not present

## 2018-03-28 DIAGNOSIS — I132 Hypertensive heart and chronic kidney disease with heart failure and with stage 5 chronic kidney disease, or end stage renal disease: Secondary | ICD-10-CM | POA: Diagnosis not present

## 2018-03-28 DIAGNOSIS — Z888 Allergy status to other drugs, medicaments and biological substances status: Secondary | ICD-10-CM | POA: Diagnosis not present

## 2018-03-28 DIAGNOSIS — Z992 Dependence on renal dialysis: Secondary | ICD-10-CM | POA: Diagnosis not present

## 2018-03-28 DIAGNOSIS — Z86711 Personal history of pulmonary embolism: Secondary | ICD-10-CM | POA: Diagnosis not present

## 2018-03-28 DIAGNOSIS — E1122 Type 2 diabetes mellitus with diabetic chronic kidney disease: Secondary | ICD-10-CM | POA: Diagnosis not present

## 2018-03-28 DIAGNOSIS — N186 End stage renal disease: Secondary | ICD-10-CM | POA: Diagnosis not present

## 2018-03-28 DIAGNOSIS — R112 Nausea with vomiting, unspecified: Secondary | ICD-10-CM | POA: Diagnosis not present

## 2018-03-28 DIAGNOSIS — Z9119 Patient's noncompliance with other medical treatment and regimen: Secondary | ICD-10-CM | POA: Diagnosis not present

## 2018-03-28 DIAGNOSIS — R918 Other nonspecific abnormal finding of lung field: Secondary | ICD-10-CM | POA: Diagnosis not present

## 2018-03-28 DIAGNOSIS — E8770 Fluid overload, unspecified: Secondary | ICD-10-CM | POA: Diagnosis not present

## 2018-03-28 DIAGNOSIS — Z7901 Long term (current) use of anticoagulants: Secondary | ICD-10-CM | POA: Diagnosis not present

## 2018-03-28 DIAGNOSIS — R1110 Vomiting, unspecified: Secondary | ICD-10-CM | POA: Diagnosis not present

## 2018-03-28 DIAGNOSIS — I5042 Chronic combined systolic (congestive) and diastolic (congestive) heart failure: Secondary | ICD-10-CM | POA: Diagnosis not present

## 2018-03-28 DIAGNOSIS — J90 Pleural effusion, not elsewhere classified: Secondary | ICD-10-CM | POA: Diagnosis not present

## 2018-03-28 DIAGNOSIS — G4733 Obstructive sleep apnea (adult) (pediatric): Secondary | ICD-10-CM | POA: Diagnosis not present

## 2018-03-28 DIAGNOSIS — Z79899 Other long term (current) drug therapy: Secondary | ICD-10-CM | POA: Diagnosis not present

## 2018-03-28 DIAGNOSIS — I120 Hypertensive chronic kidney disease with stage 5 chronic kidney disease or end stage renal disease: Secondary | ICD-10-CM | POA: Diagnosis not present

## 2018-03-28 DIAGNOSIS — E875 Hyperkalemia: Secondary | ICD-10-CM | POA: Diagnosis not present

## 2018-03-29 DIAGNOSIS — E875 Hyperkalemia: Secondary | ICD-10-CM | POA: Diagnosis not present

## 2018-03-29 DIAGNOSIS — Z86718 Personal history of other venous thrombosis and embolism: Secondary | ICD-10-CM | POA: Diagnosis not present

## 2018-03-29 DIAGNOSIS — G4731 Primary central sleep apnea: Secondary | ICD-10-CM | POA: Diagnosis not present

## 2018-03-29 DIAGNOSIS — Z86711 Personal history of pulmonary embolism: Secondary | ICD-10-CM | POA: Diagnosis not present

## 2018-03-29 DIAGNOSIS — N186 End stage renal disease: Secondary | ICD-10-CM | POA: Diagnosis not present

## 2018-03-29 DIAGNOSIS — I5042 Chronic combined systolic (congestive) and diastolic (congestive) heart failure: Secondary | ICD-10-CM | POA: Diagnosis not present

## 2018-03-29 DIAGNOSIS — I10 Essential (primary) hypertension: Secondary | ICD-10-CM | POA: Diagnosis not present

## 2018-03-29 DIAGNOSIS — R1110 Vomiting, unspecified: Secondary | ICD-10-CM | POA: Diagnosis not present

## 2018-03-29 DIAGNOSIS — Z992 Dependence on renal dialysis: Secondary | ICD-10-CM | POA: Diagnosis not present

## 2018-03-30 DIAGNOSIS — D509 Iron deficiency anemia, unspecified: Secondary | ICD-10-CM | POA: Diagnosis not present

## 2018-03-30 DIAGNOSIS — Z992 Dependence on renal dialysis: Secondary | ICD-10-CM | POA: Diagnosis not present

## 2018-03-30 DIAGNOSIS — N186 End stage renal disease: Secondary | ICD-10-CM | POA: Diagnosis not present

## 2018-03-30 DIAGNOSIS — D631 Anemia in chronic kidney disease: Secondary | ICD-10-CM | POA: Diagnosis not present

## 2018-03-30 DIAGNOSIS — N2581 Secondary hyperparathyroidism of renal origin: Secondary | ICD-10-CM | POA: Diagnosis not present

## 2018-03-31 DIAGNOSIS — M5440 Lumbago with sciatica, unspecified side: Secondary | ICD-10-CM | POA: Diagnosis not present

## 2018-03-31 DIAGNOSIS — M129 Arthropathy, unspecified: Secondary | ICD-10-CM | POA: Diagnosis not present

## 2018-03-31 DIAGNOSIS — R1084 Generalized abdominal pain: Secondary | ICD-10-CM | POA: Diagnosis not present

## 2018-03-31 DIAGNOSIS — Z79899 Other long term (current) drug therapy: Secondary | ICD-10-CM | POA: Diagnosis not present

## 2018-04-01 DIAGNOSIS — Z992 Dependence on renal dialysis: Secondary | ICD-10-CM | POA: Diagnosis not present

## 2018-04-01 DIAGNOSIS — N186 End stage renal disease: Secondary | ICD-10-CM | POA: Diagnosis not present

## 2018-04-01 DIAGNOSIS — D631 Anemia in chronic kidney disease: Secondary | ICD-10-CM | POA: Diagnosis not present

## 2018-04-01 DIAGNOSIS — D509 Iron deficiency anemia, unspecified: Secondary | ICD-10-CM | POA: Diagnosis not present

## 2018-04-01 DIAGNOSIS — N2581 Secondary hyperparathyroidism of renal origin: Secondary | ICD-10-CM | POA: Diagnosis not present

## 2018-04-02 DIAGNOSIS — Z86718 Personal history of other venous thrombosis and embolism: Secondary | ICD-10-CM | POA: Diagnosis not present

## 2018-04-02 DIAGNOSIS — Z940 Kidney transplant status: Secondary | ICD-10-CM | POA: Diagnosis not present

## 2018-04-02 DIAGNOSIS — I10 Essential (primary) hypertension: Secondary | ICD-10-CM | POA: Diagnosis not present

## 2018-04-02 DIAGNOSIS — N261 Atrophy of kidney (terminal): Secondary | ICD-10-CM | POA: Diagnosis not present

## 2018-04-02 DIAGNOSIS — K8020 Calculus of gallbladder without cholecystitis without obstruction: Secondary | ICD-10-CM | POA: Diagnosis not present

## 2018-04-02 DIAGNOSIS — I132 Hypertensive heart and chronic kidney disease with heart failure and with stage 5 chronic kidney disease, or end stage renal disease: Secondary | ICD-10-CM | POA: Diagnosis not present

## 2018-04-02 DIAGNOSIS — F4323 Adjustment disorder with mixed anxiety and depressed mood: Secondary | ICD-10-CM | POA: Diagnosis not present

## 2018-04-02 DIAGNOSIS — Z888 Allergy status to other drugs, medicaments and biological substances status: Secondary | ICD-10-CM | POA: Diagnosis not present

## 2018-04-02 DIAGNOSIS — N19 Unspecified kidney failure: Secondary | ICD-10-CM | POA: Diagnosis not present

## 2018-04-02 DIAGNOSIS — I5043 Acute on chronic combined systolic (congestive) and diastolic (congestive) heart failure: Secondary | ICD-10-CM | POA: Diagnosis not present

## 2018-04-02 DIAGNOSIS — R918 Other nonspecific abnormal finding of lung field: Secondary | ICD-10-CM | POA: Diagnosis not present

## 2018-04-02 DIAGNOSIS — D631 Anemia in chronic kidney disease: Secondary | ICD-10-CM | POA: Diagnosis not present

## 2018-04-02 DIAGNOSIS — Z992 Dependence on renal dialysis: Secondary | ICD-10-CM | POA: Diagnosis not present

## 2018-04-02 DIAGNOSIS — Z9115 Patient's noncompliance with renal dialysis: Secondary | ICD-10-CM | POA: Diagnosis not present

## 2018-04-02 DIAGNOSIS — E1122 Type 2 diabetes mellitus with diabetic chronic kidney disease: Secondary | ICD-10-CM | POA: Diagnosis not present

## 2018-04-02 DIAGNOSIS — R1110 Vomiting, unspecified: Secondary | ICD-10-CM | POA: Diagnosis not present

## 2018-04-02 DIAGNOSIS — I120 Hypertensive chronic kidney disease with stage 5 chronic kidney disease or end stage renal disease: Secondary | ICD-10-CM | POA: Diagnosis not present

## 2018-04-02 DIAGNOSIS — I5042 Chronic combined systolic (congestive) and diastolic (congestive) heart failure: Secondary | ICD-10-CM | POA: Diagnosis not present

## 2018-04-02 DIAGNOSIS — N2889 Other specified disorders of kidney and ureter: Secondary | ICD-10-CM | POA: Diagnosis not present

## 2018-04-02 DIAGNOSIS — R188 Other ascites: Secondary | ICD-10-CM | POA: Diagnosis not present

## 2018-04-02 DIAGNOSIS — G4733 Obstructive sleep apnea (adult) (pediatric): Secondary | ICD-10-CM | POA: Diagnosis not present

## 2018-04-02 DIAGNOSIS — Z9114 Patient's other noncompliance with medication regimen: Secondary | ICD-10-CM | POA: Diagnosis not present

## 2018-04-02 DIAGNOSIS — K858 Other acute pancreatitis without necrosis or infection: Secondary | ICD-10-CM | POA: Diagnosis not present

## 2018-04-02 DIAGNOSIS — I5041 Acute combined systolic (congestive) and diastolic (congestive) heart failure: Secondary | ICD-10-CM | POA: Diagnosis not present

## 2018-04-02 DIAGNOSIS — Z7901 Long term (current) use of anticoagulants: Secondary | ICD-10-CM | POA: Diagnosis not present

## 2018-04-02 DIAGNOSIS — J90 Pleural effusion, not elsewhere classified: Secondary | ICD-10-CM | POA: Diagnosis not present

## 2018-04-02 DIAGNOSIS — R0602 Shortness of breath: Secondary | ICD-10-CM | POA: Diagnosis not present

## 2018-04-02 DIAGNOSIS — N186 End stage renal disease: Secondary | ICD-10-CM | POA: Diagnosis not present

## 2018-04-02 DIAGNOSIS — Z79899 Other long term (current) drug therapy: Secondary | ICD-10-CM | POA: Diagnosis not present

## 2018-04-02 DIAGNOSIS — I509 Heart failure, unspecified: Secondary | ICD-10-CM | POA: Diagnosis not present

## 2018-04-02 DIAGNOSIS — I160 Hypertensive urgency: Secondary | ICD-10-CM | POA: Diagnosis not present

## 2018-04-02 DIAGNOSIS — K219 Gastro-esophageal reflux disease without esophagitis: Secondary | ICD-10-CM | POA: Diagnosis not present

## 2018-04-02 DIAGNOSIS — R601 Generalized edema: Secondary | ICD-10-CM | POA: Diagnosis not present

## 2018-04-02 DIAGNOSIS — J811 Chronic pulmonary edema: Secondary | ICD-10-CM | POA: Diagnosis not present

## 2018-04-03 DIAGNOSIS — N2581 Secondary hyperparathyroidism of renal origin: Secondary | ICD-10-CM | POA: Diagnosis not present

## 2018-04-03 DIAGNOSIS — Z79899 Other long term (current) drug therapy: Secondary | ICD-10-CM | POA: Diagnosis not present

## 2018-04-03 DIAGNOSIS — D509 Iron deficiency anemia, unspecified: Secondary | ICD-10-CM | POA: Diagnosis not present

## 2018-04-03 DIAGNOSIS — D631 Anemia in chronic kidney disease: Secondary | ICD-10-CM | POA: Diagnosis not present

## 2018-04-03 DIAGNOSIS — M129 Arthropathy, unspecified: Secondary | ICD-10-CM | POA: Diagnosis not present

## 2018-04-03 DIAGNOSIS — Z992 Dependence on renal dialysis: Secondary | ICD-10-CM | POA: Diagnosis not present

## 2018-04-03 DIAGNOSIS — M5440 Lumbago with sciatica, unspecified side: Secondary | ICD-10-CM | POA: Diagnosis not present

## 2018-04-03 DIAGNOSIS — R1084 Generalized abdominal pain: Secondary | ICD-10-CM | POA: Diagnosis not present

## 2018-04-03 DIAGNOSIS — N186 End stage renal disease: Secondary | ICD-10-CM | POA: Diagnosis not present

## 2018-04-04 DIAGNOSIS — N186 End stage renal disease: Secondary | ICD-10-CM | POA: Diagnosis not present

## 2018-04-04 DIAGNOSIS — N2889 Other specified disorders of kidney and ureter: Secondary | ICD-10-CM | POA: Diagnosis not present

## 2018-04-04 DIAGNOSIS — I120 Hypertensive chronic kidney disease with stage 5 chronic kidney disease or end stage renal disease: Secondary | ICD-10-CM | POA: Diagnosis not present

## 2018-04-04 DIAGNOSIS — I132 Hypertensive heart and chronic kidney disease with heart failure and with stage 5 chronic kidney disease, or end stage renal disease: Secondary | ICD-10-CM | POA: Diagnosis not present

## 2018-04-04 DIAGNOSIS — J984 Other disorders of lung: Secondary | ICD-10-CM | POA: Diagnosis not present

## 2018-04-04 DIAGNOSIS — Z992 Dependence on renal dialysis: Secondary | ICD-10-CM | POA: Diagnosis not present

## 2018-04-04 DIAGNOSIS — Z940 Kidney transplant status: Secondary | ICD-10-CM | POA: Diagnosis not present

## 2018-04-04 DIAGNOSIS — K5730 Diverticulosis of large intestine without perforation or abscess without bleeding: Secondary | ICD-10-CM | POA: Diagnosis not present

## 2018-04-04 DIAGNOSIS — D631 Anemia in chronic kidney disease: Secondary | ICD-10-CM | POA: Diagnosis not present

## 2018-04-04 DIAGNOSIS — N261 Atrophy of kidney (terminal): Secondary | ICD-10-CM | POA: Diagnosis not present

## 2018-04-04 DIAGNOSIS — R918 Other nonspecific abnormal finding of lung field: Secondary | ICD-10-CM | POA: Diagnosis not present

## 2018-04-04 DIAGNOSIS — I313 Pericardial effusion (noninflammatory): Secondary | ICD-10-CM | POA: Diagnosis not present

## 2018-04-04 DIAGNOSIS — J90 Pleural effusion, not elsewhere classified: Secondary | ICD-10-CM | POA: Diagnosis not present

## 2018-04-04 DIAGNOSIS — I5042 Chronic combined systolic (congestive) and diastolic (congestive) heart failure: Secondary | ICD-10-CM | POA: Diagnosis not present

## 2018-04-04 DIAGNOSIS — R0602 Shortness of breath: Secondary | ICD-10-CM | POA: Diagnosis not present

## 2018-04-04 DIAGNOSIS — R160 Hepatomegaly, not elsewhere classified: Secondary | ICD-10-CM | POA: Diagnosis not present

## 2018-04-05 DIAGNOSIS — J90 Pleural effusion, not elsewhere classified: Secondary | ICD-10-CM | POA: Diagnosis not present

## 2018-04-05 DIAGNOSIS — I509 Heart failure, unspecified: Secondary | ICD-10-CM | POA: Diagnosis not present

## 2018-04-05 DIAGNOSIS — N186 End stage renal disease: Secondary | ICD-10-CM | POA: Diagnosis not present

## 2018-04-05 DIAGNOSIS — R0989 Other specified symptoms and signs involving the circulatory and respiratory systems: Secondary | ICD-10-CM | POA: Diagnosis not present

## 2018-04-05 DIAGNOSIS — J9811 Atelectasis: Secondary | ICD-10-CM | POA: Diagnosis not present

## 2018-04-06 DIAGNOSIS — N186 End stage renal disease: Secondary | ICD-10-CM | POA: Diagnosis not present

## 2018-04-06 DIAGNOSIS — I517 Cardiomegaly: Secondary | ICD-10-CM | POA: Diagnosis not present

## 2018-04-06 DIAGNOSIS — Z992 Dependence on renal dialysis: Secondary | ICD-10-CM | POA: Diagnosis not present

## 2018-04-06 DIAGNOSIS — F4323 Adjustment disorder with mixed anxiety and depressed mood: Secondary | ICD-10-CM | POA: Diagnosis not present

## 2018-04-06 DIAGNOSIS — I509 Heart failure, unspecified: Secondary | ICD-10-CM | POA: Diagnosis not present

## 2018-04-06 DIAGNOSIS — I10 Essential (primary) hypertension: Secondary | ICD-10-CM | POA: Diagnosis not present

## 2018-04-06 DIAGNOSIS — J90 Pleural effusion, not elsewhere classified: Secondary | ICD-10-CM | POA: Diagnosis not present

## 2018-04-06 DIAGNOSIS — R0602 Shortness of breath: Secondary | ICD-10-CM | POA: Diagnosis not present

## 2018-04-06 DIAGNOSIS — R918 Other nonspecific abnormal finding of lung field: Secondary | ICD-10-CM | POA: Diagnosis not present

## 2018-04-06 DIAGNOSIS — Z86718 Personal history of other venous thrombosis and embolism: Secondary | ICD-10-CM | POA: Diagnosis not present

## 2018-04-06 DIAGNOSIS — G4731 Primary central sleep apnea: Secondary | ICD-10-CM | POA: Diagnosis not present

## 2018-04-06 DIAGNOSIS — D631 Anemia in chronic kidney disease: Secondary | ICD-10-CM | POA: Diagnosis not present

## 2018-04-07 ENCOUNTER — Encounter (HOSPITAL_COMMUNITY): Payer: Self-pay

## 2018-04-07 DIAGNOSIS — G8929 Other chronic pain: Secondary | ICD-10-CM | POA: Diagnosis not present

## 2018-04-07 DIAGNOSIS — I5042 Chronic combined systolic (congestive) and diastolic (congestive) heart failure: Secondary | ICD-10-CM | POA: Insufficient documentation

## 2018-04-07 DIAGNOSIS — Z940 Kidney transplant status: Secondary | ICD-10-CM | POA: Diagnosis not present

## 2018-04-07 DIAGNOSIS — Z7901 Long term (current) use of anticoagulants: Secondary | ICD-10-CM | POA: Diagnosis not present

## 2018-04-07 DIAGNOSIS — R1084 Generalized abdominal pain: Secondary | ICD-10-CM | POA: Diagnosis not present

## 2018-04-07 DIAGNOSIS — E119 Type 2 diabetes mellitus without complications: Secondary | ICD-10-CM | POA: Insufficient documentation

## 2018-04-07 DIAGNOSIS — Z79899 Other long term (current) drug therapy: Secondary | ICD-10-CM | POA: Diagnosis not present

## 2018-04-07 DIAGNOSIS — G894 Chronic pain syndrome: Secondary | ICD-10-CM | POA: Diagnosis not present

## 2018-04-07 DIAGNOSIS — Z888 Allergy status to other drugs, medicaments and biological substances status: Secondary | ICD-10-CM | POA: Diagnosis not present

## 2018-04-07 DIAGNOSIS — G4733 Obstructive sleep apnea (adult) (pediatric): Secondary | ICD-10-CM | POA: Diagnosis present

## 2018-04-07 DIAGNOSIS — I132 Hypertensive heart and chronic kidney disease with heart failure and with stage 5 chronic kidney disease, or end stage renal disease: Secondary | ICD-10-CM | POA: Diagnosis present

## 2018-04-07 DIAGNOSIS — Z992 Dependence on renal dialysis: Secondary | ICD-10-CM | POA: Insufficient documentation

## 2018-04-07 DIAGNOSIS — I517 Cardiomegaly: Secondary | ICD-10-CM | POA: Diagnosis not present

## 2018-04-07 DIAGNOSIS — D631 Anemia in chronic kidney disease: Secondary | ICD-10-CM | POA: Diagnosis present

## 2018-04-07 DIAGNOSIS — Z86718 Personal history of other venous thrombosis and embolism: Secondary | ICD-10-CM | POA: Diagnosis not present

## 2018-04-07 DIAGNOSIS — M545 Low back pain: Secondary | ICD-10-CM | POA: Diagnosis not present

## 2018-04-07 DIAGNOSIS — N186 End stage renal disease: Secondary | ICD-10-CM | POA: Diagnosis not present

## 2018-04-07 DIAGNOSIS — I313 Pericardial effusion (noninflammatory): Secondary | ICD-10-CM | POA: Diagnosis present

## 2018-04-07 DIAGNOSIS — R0602 Shortness of breath: Secondary | ICD-10-CM | POA: Diagnosis not present

## 2018-04-07 DIAGNOSIS — Z86711 Personal history of pulmonary embolism: Secondary | ICD-10-CM | POA: Diagnosis not present

## 2018-04-07 DIAGNOSIS — K219 Gastro-esophageal reflux disease without esophagitis: Secondary | ICD-10-CM | POA: Diagnosis present

## 2018-04-07 DIAGNOSIS — I509 Heart failure, unspecified: Secondary | ICD-10-CM | POA: Diagnosis not present

## 2018-04-07 DIAGNOSIS — J90 Pleural effusion, not elsewhere classified: Secondary | ICD-10-CM | POA: Diagnosis not present

## 2018-04-07 DIAGNOSIS — F4323 Adjustment disorder with mixed anxiety and depressed mood: Secondary | ICD-10-CM | POA: Diagnosis present

## 2018-04-07 DIAGNOSIS — J811 Chronic pulmonary edema: Secondary | ICD-10-CM | POA: Diagnosis not present

## 2018-04-07 DIAGNOSIS — Z79891 Long term (current) use of opiate analgesic: Secondary | ICD-10-CM | POA: Diagnosis not present

## 2018-04-07 DIAGNOSIS — Z87891 Personal history of nicotine dependence: Secondary | ICD-10-CM | POA: Insufficient documentation

## 2018-04-07 MED ORDER — LINACLOTIDE 290 MCG PO CAPS
290.00 | ORAL_CAPSULE | ORAL | Status: DC
Start: 2018-04-08 — End: 2018-04-07

## 2018-04-07 MED ORDER — GENERIC EXTERNAL MEDICATION
650.00 | Status: DC
Start: ? — End: 2018-04-07

## 2018-04-07 MED ORDER — CARVEDILOL 25 MG PO TABS
25.00 | ORAL_TABLET | ORAL | Status: DC
Start: 2018-04-07 — End: 2018-04-07

## 2018-04-07 MED ORDER — SEVELAMER CARBONATE 800 MG PO TABS
1600.00 | ORAL_TABLET | ORAL | Status: DC
Start: ? — End: 2018-04-07

## 2018-04-07 MED ORDER — DICLOFENAC SODIUM 1 % TD GEL
2.00 | TRANSDERMAL | Status: DC
Start: ? — End: 2018-04-07

## 2018-04-07 MED ORDER — NEPHRO-VITE 0.8 MG PO TABS
1.00 | ORAL_TABLET | ORAL | Status: DC
Start: 2018-04-08 — End: 2018-04-07

## 2018-04-07 MED ORDER — SODIUM CHLORIDE 0.9 % IV SOLN
10.00 | INTRAVENOUS | Status: DC
Start: ? — End: 2018-04-07

## 2018-04-07 MED ORDER — TRAMADOL HCL 50 MG PO TABS
25.00 | ORAL_TABLET | ORAL | Status: DC
Start: ? — End: 2018-04-07

## 2018-04-07 MED ORDER — HYDRALAZINE HCL 50 MG PO TABS
100.00 | ORAL_TABLET | ORAL | Status: DC
Start: 2018-04-07 — End: 2018-04-07

## 2018-04-07 MED ORDER — SEVELAMER CARBONATE 800 MG PO TABS
2400.00 | ORAL_TABLET | ORAL | Status: DC
Start: 2018-04-07 — End: 2018-04-07

## 2018-04-07 MED ORDER — DARBEPOETIN ALFA 60 MCG/ML IJ SOLN
60.00 | INTRAMUSCULAR | Status: DC
Start: ? — End: 2018-04-07

## 2018-04-07 MED ORDER — ACETAMINOPHEN 325 MG PO TABS
650.00 | ORAL_TABLET | ORAL | Status: DC
Start: ? — End: 2018-04-07

## 2018-04-07 MED ORDER — NITROGLYCERIN 0.4 MG SL SUBL
0.40 | SUBLINGUAL_TABLET | SUBLINGUAL | Status: DC
Start: ? — End: 2018-04-07

## 2018-04-07 MED ORDER — GENERIC EXTERNAL MEDICATION
90.00 | Status: DC
Start: 2018-04-07 — End: 2018-04-07

## 2018-04-07 MED ORDER — AMLODIPINE BESYLATE 5 MG PO TABS
5.00 | ORAL_TABLET | ORAL | Status: DC
Start: 2018-04-07 — End: 2018-04-07

## 2018-04-07 MED ORDER — GENERIC EXTERNAL MEDICATION
10.00 | Status: DC
Start: ? — End: 2018-04-07

## 2018-04-07 MED ORDER — PANTOPRAZOLE SODIUM 40 MG PO TBEC
40.00 | DELAYED_RELEASE_TABLET | ORAL | Status: DC
Start: 2018-04-07 — End: 2018-04-07

## 2018-04-07 MED ORDER — GABAPENTIN 300 MG PO CAPS
300.00 | ORAL_CAPSULE | ORAL | Status: DC
Start: 2018-04-07 — End: 2018-04-07

## 2018-04-07 MED ORDER — GENERIC EXTERNAL MEDICATION
4.00 | Status: DC
Start: ? — End: 2018-04-07

## 2018-04-07 MED ORDER — CAMPHOR-MENTHOL 0.5-0.5 % EX LOTN
TOPICAL_LOTION | CUTANEOUS | Status: DC
Start: ? — End: 2018-04-07

## 2018-04-07 MED ORDER — LANTHANUM CARBONATE 500 MG PO CHEW
1000.00 | CHEWABLE_TABLET | ORAL | Status: DC
Start: 2018-04-07 — End: 2018-04-07

## 2018-04-07 MED ORDER — BENZONATATE 100 MG PO CAPS
100.00 | ORAL_CAPSULE | ORAL | Status: DC
Start: ? — End: 2018-04-07

## 2018-04-07 MED ORDER — POLYETHYLENE GLYCOL 3350 17 G PO PACK
17.00 | PACK | ORAL | Status: DC
Start: ? — End: 2018-04-07

## 2018-04-07 MED ORDER — LISINOPRIL 5 MG PO TABS
5.00 | ORAL_TABLET | ORAL | Status: DC
Start: 2018-04-08 — End: 2018-04-07

## 2018-04-07 NOTE — ED Triage Notes (Signed)
Pt complains of left flank pain and urinary retention for to days Pt is an outpatient dialysis patient, last treatment Monday Pt states he's been vomiting and having diarrhea since then

## 2018-04-07 NOTE — ED Notes (Signed)
Bed: TD42 Expected date:  Expected time:  Means of arrival:  Comments: EMS 52yo male from home abdominal pain-hx stomach ulcers-blood in stool-BP 76 palp/89/66 HR 40's

## 2018-04-08 ENCOUNTER — Emergency Department (HOSPITAL_COMMUNITY)
Admission: EM | Admit: 2018-04-08 | Discharge: 2018-04-08 | Disposition: A | Discharge status: Home / Self Care | Payer: Medicare Other | Attending: Emergency Medicine | Admitting: Emergency Medicine

## 2018-04-08 ENCOUNTER — Encounter (HOSPITAL_COMMUNITY): Payer: Self-pay

## 2018-04-08 ENCOUNTER — Emergency Department (HOSPITAL_COMMUNITY): Payer: Medicare Other

## 2018-04-08 DIAGNOSIS — J181 Lobar pneumonia, unspecified organism: Secondary | ICD-10-CM | POA: Diagnosis not present

## 2018-04-08 DIAGNOSIS — Z7901 Long term (current) use of anticoagulants: Secondary | ICD-10-CM | POA: Diagnosis not present

## 2018-04-08 DIAGNOSIS — Z886 Allergy status to analgesic agent status: Secondary | ICD-10-CM | POA: Diagnosis not present

## 2018-04-08 DIAGNOSIS — R109 Unspecified abdominal pain: Secondary | ICD-10-CM | POA: Diagnosis not present

## 2018-04-08 DIAGNOSIS — Z79891 Long term (current) use of opiate analgesic: Secondary | ICD-10-CM | POA: Diagnosis not present

## 2018-04-08 DIAGNOSIS — R1030 Lower abdominal pain, unspecified: Secondary | ICD-10-CM | POA: Diagnosis not present

## 2018-04-08 DIAGNOSIS — N186 End stage renal disease: Secondary | ICD-10-CM

## 2018-04-08 DIAGNOSIS — G8929 Other chronic pain: Secondary | ICD-10-CM | POA: Diagnosis not present

## 2018-04-08 DIAGNOSIS — Z992 Dependence on renal dialysis: Secondary | ICD-10-CM | POA: Diagnosis not present

## 2018-04-08 DIAGNOSIS — Z79899 Other long term (current) drug therapy: Secondary | ICD-10-CM | POA: Diagnosis not present

## 2018-04-08 DIAGNOSIS — I120 Hypertensive chronic kidney disease with stage 5 chronic kidney disease or end stage renal disease: Secondary | ICD-10-CM | POA: Diagnosis not present

## 2018-04-08 DIAGNOSIS — E1122 Type 2 diabetes mellitus with diabetic chronic kidney disease: Secondary | ICD-10-CM | POA: Diagnosis not present

## 2018-04-08 DIAGNOSIS — I110 Hypertensive heart disease with heart failure: Secondary | ICD-10-CM | POA: Diagnosis not present

## 2018-04-08 DIAGNOSIS — Z86718 Personal history of other venous thrombosis and embolism: Secondary | ICD-10-CM | POA: Diagnosis not present

## 2018-04-08 DIAGNOSIS — Z881 Allergy status to other antibiotic agents status: Secondary | ICD-10-CM | POA: Diagnosis not present

## 2018-04-08 DIAGNOSIS — R9431 Abnormal electrocardiogram [ECG] [EKG]: Secondary | ICD-10-CM | POA: Diagnosis not present

## 2018-04-08 DIAGNOSIS — I509 Heart failure, unspecified: Secondary | ICD-10-CM | POA: Diagnosis not present

## 2018-04-08 LAB — CBC WITH DIFFERENTIAL/PLATELET
BASOS PCT: 0 %
Basophils Absolute: 0 10*3/uL (ref 0.0–0.1)
EOS ABS: 0.2 10*3/uL (ref 0.0–0.7)
EOS PCT: 5 %
HCT: 31 % — ABNORMAL LOW (ref 39.0–52.0)
Hemoglobin: 10 g/dL — ABNORMAL LOW (ref 13.0–17.0)
Lymphocytes Relative: 16 %
Lymphs Abs: 0.7 10*3/uL (ref 0.7–4.0)
MCH: 27.2 pg (ref 26.0–34.0)
MCHC: 32.3 g/dL (ref 30.0–36.0)
MCV: 84.2 fL (ref 78.0–100.0)
MONOS PCT: 7 %
Monocytes Absolute: 0.3 10*3/uL (ref 0.1–1.0)
NEUTROS ABS: 2.9 10*3/uL (ref 1.7–7.7)
NEUTROS PCT: 72 %
PLATELETS: 135 10*3/uL — AB (ref 150–400)
RBC: 3.68 MIL/uL — ABNORMAL LOW (ref 4.22–5.81)
RDW: 19.3 % — AB (ref 11.5–15.5)
WBC: 4 10*3/uL (ref 4.0–10.5)

## 2018-04-08 LAB — COMPREHENSIVE METABOLIC PANEL
ALBUMIN: 2.8 g/dL — AB (ref 3.5–5.0)
ALT: 16 U/L (ref 0–44)
ANION GAP: 14 (ref 5–15)
AST: 25 U/L (ref 15–41)
Alkaline Phosphatase: 102 U/L (ref 38–126)
BUN: 75 mg/dL — ABNORMAL HIGH (ref 6–20)
CO2: 25 mmol/L (ref 22–32)
Calcium: 9.4 mg/dL (ref 8.9–10.3)
Chloride: 103 mmol/L (ref 98–111)
Creatinine, Ser: 8.71 mg/dL — ABNORMAL HIGH (ref 0.61–1.24)
GFR calc non Af Amer: 6 mL/min — ABNORMAL LOW (ref >60)
GFR, EST AFRICAN AMERICAN: 7 mL/min — AB (ref >60)
GLUCOSE: 122 mg/dL — AB (ref 70–99)
POTASSIUM: 5.2 mmol/L — AB (ref 3.5–5.1)
SODIUM: 142 mmol/L (ref 135–145)
Total Bilirubin: 1 mg/dL (ref 0.3–1.2)
Total Protein: 7.6 g/dL (ref 6.5–8.1)

## 2018-04-08 LAB — LIPASE, BLOOD: Lipase: 40 U/L (ref 11–51)

## 2018-04-08 MED ORDER — ONDANSETRON HCL 4 MG/2ML IJ SOLN
4.0000 mg | Freq: Once | INTRAMUSCULAR | Status: AC
  Administered 2018-04-08: 4 mg via INTRAVENOUS
  Filled 2018-04-08: qty 2

## 2018-04-08 MED ORDER — HYDROMORPHONE HCL 1 MG/ML IJ SOLN
1.0000 mg | Freq: Once | INTRAMUSCULAR | Status: AC
  Administered 2018-04-08: 1 mg via INTRAVENOUS
  Filled 2018-04-08: qty 1

## 2018-04-08 NOTE — ED Notes (Signed)
Patient requested to stay laying down until ride arrives. Due to low census charge nurse Terri agreed to let him stay for the time being. Patient content.
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
## 2018-04-09 DIAGNOSIS — Z86718 Personal history of other venous thrombosis and embolism: Secondary | ICD-10-CM | POA: Diagnosis not present

## 2018-04-09 DIAGNOSIS — Z9115 Patient's noncompliance with renal dialysis: Secondary | ICD-10-CM | POA: Diagnosis not present

## 2018-04-09 DIAGNOSIS — Z841 Family history of disorders of kidney and ureter: Secondary | ICD-10-CM | POA: Diagnosis not present

## 2018-04-09 DIAGNOSIS — E871 Hypo-osmolality and hyponatremia: Secondary | ICD-10-CM | POA: Diagnosis not present

## 2018-04-09 DIAGNOSIS — G8929 Other chronic pain: Secondary | ICD-10-CM | POA: Diagnosis not present

## 2018-04-09 DIAGNOSIS — Z888 Allergy status to other drugs, medicaments and biological substances status: Secondary | ICD-10-CM | POA: Diagnosis not present

## 2018-04-09 DIAGNOSIS — K859 Acute pancreatitis without necrosis or infection, unspecified: Secondary | ICD-10-CM | POA: Diagnosis not present

## 2018-04-09 DIAGNOSIS — A498 Other bacterial infections of unspecified site: Secondary | ICD-10-CM | POA: Diagnosis not present

## 2018-04-09 DIAGNOSIS — N2581 Secondary hyperparathyroidism of renal origin: Secondary | ICD-10-CM | POA: Diagnosis not present

## 2018-04-09 DIAGNOSIS — Z765 Malingerer [conscious simulation]: Secondary | ICD-10-CM | POA: Diagnosis not present

## 2018-04-09 DIAGNOSIS — R109 Unspecified abdominal pain: Secondary | ICD-10-CM | POA: Diagnosis not present

## 2018-04-09 DIAGNOSIS — Z885 Allergy status to narcotic agent status: Secondary | ICD-10-CM | POA: Diagnosis not present

## 2018-04-09 DIAGNOSIS — G4733 Obstructive sleep apnea (adult) (pediatric): Secondary | ICD-10-CM | POA: Diagnosis not present

## 2018-04-09 DIAGNOSIS — R9431 Abnormal electrocardiogram [ECG] [EKG]: Secondary | ICD-10-CM | POA: Diagnosis not present

## 2018-04-09 DIAGNOSIS — I509 Heart failure, unspecified: Secondary | ICD-10-CM | POA: Diagnosis not present

## 2018-04-09 DIAGNOSIS — R112 Nausea with vomiting, unspecified: Secondary | ICD-10-CM | POA: Diagnosis not present

## 2018-04-09 DIAGNOSIS — T8612 Kidney transplant failure: Secondary | ICD-10-CM | POA: Diagnosis not present

## 2018-04-09 DIAGNOSIS — R197 Diarrhea, unspecified: Secondary | ICD-10-CM | POA: Diagnosis not present

## 2018-04-09 DIAGNOSIS — M5440 Lumbago with sciatica, unspecified side: Secondary | ICD-10-CM | POA: Diagnosis not present

## 2018-04-09 DIAGNOSIS — R600 Localized edema: Secondary | ICD-10-CM | POA: Diagnosis not present

## 2018-04-09 DIAGNOSIS — Z886 Allergy status to analgesic agent status: Secondary | ICD-10-CM | POA: Diagnosis not present

## 2018-04-09 DIAGNOSIS — E8809 Other disorders of plasma-protein metabolism, not elsewhere classified: Secondary | ICD-10-CM | POA: Diagnosis not present

## 2018-04-09 DIAGNOSIS — J90 Pleural effusion, not elsewhere classified: Secondary | ICD-10-CM | POA: Diagnosis not present

## 2018-04-09 DIAGNOSIS — Z86711 Personal history of pulmonary embolism: Secondary | ICD-10-CM | POA: Diagnosis not present

## 2018-04-09 DIAGNOSIS — N2889 Other specified disorders of kidney and ureter: Secondary | ICD-10-CM | POA: Diagnosis not present

## 2018-04-09 DIAGNOSIS — Z992 Dependence on renal dialysis: Secondary | ICD-10-CM | POA: Diagnosis not present

## 2018-04-09 DIAGNOSIS — E8770 Fluid overload, unspecified: Secondary | ICD-10-CM | POA: Diagnosis not present

## 2018-04-09 DIAGNOSIS — I10 Essential (primary) hypertension: Secondary | ICD-10-CM | POA: Diagnosis not present

## 2018-04-09 DIAGNOSIS — R7989 Other specified abnormal findings of blood chemistry: Secondary | ICD-10-CM | POA: Diagnosis not present

## 2018-04-09 DIAGNOSIS — E1122 Type 2 diabetes mellitus with diabetic chronic kidney disease: Secondary | ICD-10-CM | POA: Diagnosis not present

## 2018-04-09 DIAGNOSIS — A0472 Enterocolitis due to Clostridium difficile, not specified as recurrent: Secondary | ICD-10-CM | POA: Diagnosis not present

## 2018-04-09 DIAGNOSIS — D631 Anemia in chronic kidney disease: Secondary | ICD-10-CM | POA: Diagnosis not present

## 2018-04-09 DIAGNOSIS — K219 Gastro-esophageal reflux disease without esophagitis: Secondary | ICD-10-CM | POA: Diagnosis not present

## 2018-04-09 DIAGNOSIS — I120 Hypertensive chronic kidney disease with stage 5 chronic kidney disease or end stage renal disease: Secondary | ICD-10-CM | POA: Diagnosis not present

## 2018-04-09 DIAGNOSIS — Z8719 Personal history of other diseases of the digestive system: Secondary | ICD-10-CM | POA: Diagnosis not present

## 2018-04-09 DIAGNOSIS — N186 End stage renal disease: Secondary | ICD-10-CM | POA: Diagnosis not present

## 2018-04-09 DIAGNOSIS — I132 Hypertensive heart and chronic kidney disease with heart failure and with stage 5 chronic kidney disease, or end stage renal disease: Secondary | ICD-10-CM | POA: Diagnosis not present

## 2018-04-09 DIAGNOSIS — T8611 Kidney transplant rejection: Secondary | ICD-10-CM | POA: Diagnosis not present

## 2018-04-09 DIAGNOSIS — Z87891 Personal history of nicotine dependence: Secondary | ICD-10-CM | POA: Diagnosis not present

## 2018-04-09 DIAGNOSIS — E875 Hyperkalemia: Secondary | ICD-10-CM | POA: Diagnosis not present

## 2018-04-09 DIAGNOSIS — R17 Unspecified jaundice: Secondary | ICD-10-CM | POA: Diagnosis not present

## 2018-04-09 DIAGNOSIS — Z8249 Family history of ischemic heart disease and other diseases of the circulatory system: Secondary | ICD-10-CM | POA: Diagnosis not present

## 2018-04-09 DIAGNOSIS — Z794 Long term (current) use of insulin: Secondary | ICD-10-CM | POA: Diagnosis not present

## 2018-04-09 DIAGNOSIS — I110 Hypertensive heart disease with heart failure: Secondary | ICD-10-CM | POA: Diagnosis not present

## 2018-04-09 DIAGNOSIS — I5020 Unspecified systolic (congestive) heart failure: Secondary | ICD-10-CM | POA: Diagnosis not present

## 2018-04-09 DIAGNOSIS — I160 Hypertensive urgency: Secondary | ICD-10-CM | POA: Diagnosis not present

## 2018-04-09 DIAGNOSIS — K861 Other chronic pancreatitis: Secondary | ICD-10-CM | POA: Diagnosis not present

## 2018-04-09 DIAGNOSIS — Z79899 Other long term (current) drug therapy: Secondary | ICD-10-CM | POA: Diagnosis not present

## 2018-04-09 DIAGNOSIS — G4730 Sleep apnea, unspecified: Secondary | ICD-10-CM | POA: Diagnosis not present

## 2018-04-09 DIAGNOSIS — Z833 Family history of diabetes mellitus: Secondary | ICD-10-CM | POA: Diagnosis not present

## 2018-04-09 DIAGNOSIS — R05 Cough: Secondary | ICD-10-CM | POA: Diagnosis not present

## 2018-04-17 DIAGNOSIS — Z79899 Other long term (current) drug therapy: Secondary | ICD-10-CM | POA: Diagnosis not present

## 2018-04-17 DIAGNOSIS — M5440 Lumbago with sciatica, unspecified side: Secondary | ICD-10-CM | POA: Diagnosis not present

## 2018-04-17 DIAGNOSIS — A498 Other bacterial infections of unspecified site: Secondary | ICD-10-CM | POA: Diagnosis not present

## 2018-04-21 ENCOUNTER — Ambulatory Visit: Payer: Medicare Other | Admitting: Urology

## 2018-04-21 DIAGNOSIS — Z992 Dependence on renal dialysis: Secondary | ICD-10-CM | POA: Diagnosis not present

## 2018-04-21 DIAGNOSIS — N186 End stage renal disease: Secondary | ICD-10-CM | POA: Diagnosis not present

## 2018-04-21 DIAGNOSIS — T8610 Unspecified complication of kidney transplant: Secondary | ICD-10-CM | POA: Diagnosis not present

## 2018-04-24 ENCOUNTER — Encounter (HOSPITAL_COMMUNITY): Payer: Self-pay | Admitting: Emergency Medicine

## 2018-04-24 ENCOUNTER — Observation Stay (HOSPITAL_COMMUNITY)
Admission: EM | Admit: 2018-04-24 | Discharge: 2018-04-27 | Disposition: A | Discharge status: Home / Self Care | Payer: Medicare Other | Attending: Family Medicine | Admitting: Family Medicine

## 2018-04-24 DIAGNOSIS — J90 Pleural effusion, not elsewhere classified: Secondary | ICD-10-CM | POA: Diagnosis not present

## 2018-04-24 DIAGNOSIS — E1122 Type 2 diabetes mellitus with diabetic chronic kidney disease: Secondary | ICD-10-CM | POA: Diagnosis not present

## 2018-04-24 DIAGNOSIS — E875 Hyperkalemia: Secondary | ICD-10-CM | POA: Insufficient documentation

## 2018-04-24 DIAGNOSIS — Z794 Long term (current) use of insulin: Secondary | ICD-10-CM | POA: Diagnosis not present

## 2018-04-24 DIAGNOSIS — K861 Other chronic pancreatitis: Secondary | ICD-10-CM | POA: Diagnosis not present

## 2018-04-24 DIAGNOSIS — E1129 Type 2 diabetes mellitus with other diabetic kidney complication: Secondary | ICD-10-CM | POA: Diagnosis present

## 2018-04-24 DIAGNOSIS — I5020 Unspecified systolic (congestive) heart failure: Secondary | ICD-10-CM

## 2018-04-24 DIAGNOSIS — G4730 Sleep apnea, unspecified: Secondary | ICD-10-CM | POA: Insufficient documentation

## 2018-04-24 DIAGNOSIS — N2581 Secondary hyperparathyroidism of renal origin: Secondary | ICD-10-CM | POA: Insufficient documentation

## 2018-04-24 DIAGNOSIS — Z886 Allergy status to analgesic agent status: Secondary | ICD-10-CM | POA: Insufficient documentation

## 2018-04-24 DIAGNOSIS — E785 Hyperlipidemia, unspecified: Secondary | ICD-10-CM | POA: Insufficient documentation

## 2018-04-24 DIAGNOSIS — I5042 Chronic combined systolic (congestive) and diastolic (congestive) heart failure: Secondary | ICD-10-CM | POA: Diagnosis not present

## 2018-04-24 DIAGNOSIS — F17210 Nicotine dependence, cigarettes, uncomplicated: Secondary | ICD-10-CM | POA: Diagnosis not present

## 2018-04-24 DIAGNOSIS — I700 Atherosclerosis of aorta: Secondary | ICD-10-CM | POA: Diagnosis not present

## 2018-04-24 DIAGNOSIS — F4323 Adjustment disorder with mixed anxiety and depressed mood: Secondary | ICD-10-CM | POA: Diagnosis not present

## 2018-04-24 DIAGNOSIS — N186 End stage renal disease: Secondary | ICD-10-CM | POA: Insufficient documentation

## 2018-04-24 DIAGNOSIS — Z9115 Patient's noncompliance with renal dialysis: Secondary | ICD-10-CM | POA: Insufficient documentation

## 2018-04-24 DIAGNOSIS — I2510 Atherosclerotic heart disease of native coronary artery without angina pectoris: Secondary | ICD-10-CM | POA: Insufficient documentation

## 2018-04-24 DIAGNOSIS — Z765 Malingerer [conscious simulation]: Secondary | ICD-10-CM | POA: Diagnosis not present

## 2018-04-24 DIAGNOSIS — Z79899 Other long term (current) drug therapy: Secondary | ICD-10-CM | POA: Diagnosis not present

## 2018-04-24 DIAGNOSIS — M79671 Pain in right foot: Secondary | ICD-10-CM | POA: Diagnosis present

## 2018-04-24 DIAGNOSIS — D631 Anemia in chronic kidney disease: Secondary | ICD-10-CM | POA: Insufficient documentation

## 2018-04-24 DIAGNOSIS — I132 Hypertensive heart and chronic kidney disease with heart failure and with stage 5 chronic kidney disease, or end stage renal disease: Principal | ICD-10-CM | POA: Insufficient documentation

## 2018-04-24 DIAGNOSIS — Z7901 Long term (current) use of anticoagulants: Secondary | ICD-10-CM | POA: Insufficient documentation

## 2018-04-24 DIAGNOSIS — Z86718 Personal history of other venous thrombosis and embolism: Secondary | ICD-10-CM | POA: Diagnosis not present

## 2018-04-24 DIAGNOSIS — N185 Chronic kidney disease, stage 5: Secondary | ICD-10-CM

## 2018-04-24 DIAGNOSIS — E8770 Fluid overload, unspecified: Secondary | ICD-10-CM | POA: Diagnosis not present

## 2018-04-24 DIAGNOSIS — K219 Gastro-esophageal reflux disease without esophagitis: Secondary | ICD-10-CM | POA: Insufficient documentation

## 2018-04-24 DIAGNOSIS — I428 Other cardiomyopathies: Secondary | ICD-10-CM

## 2018-04-24 DIAGNOSIS — I120 Hypertensive chronic kidney disease with stage 5 chronic kidney disease or end stage renal disease: Secondary | ICD-10-CM | POA: Diagnosis not present

## 2018-04-24 DIAGNOSIS — E1165 Type 2 diabetes mellitus with hyperglycemia: Secondary | ICD-10-CM | POA: Insufficient documentation

## 2018-04-24 DIAGNOSIS — IMO0002 Reserved for concepts with insufficient information to code with codable children: Secondary | ICD-10-CM | POA: Diagnosis present

## 2018-04-24 DIAGNOSIS — Z992 Dependence on renal dialysis: Secondary | ICD-10-CM | POA: Diagnosis not present

## 2018-04-24 DIAGNOSIS — F1210 Cannabis abuse, uncomplicated: Secondary | ICD-10-CM | POA: Insufficient documentation

## 2018-04-24 DIAGNOSIS — N19 Unspecified kidney failure: Secondary | ICD-10-CM | POA: Diagnosis present

## 2018-04-24 DIAGNOSIS — S99921A Unspecified injury of right foot, initial encounter: Secondary | ICD-10-CM | POA: Diagnosis not present

## 2018-04-24 DIAGNOSIS — N189 Chronic kidney disease, unspecified: Secondary | ICD-10-CM

## 2018-04-24 LAB — COMPREHENSIVE METABOLIC PANEL
ALK PHOS: 96 U/L (ref 38–126)
ALT: 14 U/L (ref 0–44)
ANION GAP: 18 — AB (ref 5–15)
AST: 21 U/L (ref 15–41)
Albumin: 2.8 g/dL — ABNORMAL LOW (ref 3.5–5.0)
BUN: 94 mg/dL — ABNORMAL HIGH (ref 6–20)
CALCIUM: 8.5 mg/dL — AB (ref 8.9–10.3)
CHLORIDE: 100 mmol/L (ref 98–111)
CO2: 16 mmol/L — AB (ref 22–32)
CREATININE: 17.9 mg/dL — AB (ref 0.61–1.24)
GFR, EST AFRICAN AMERICAN: 3 mL/min — AB (ref >60)
GFR, EST NON AFRICAN AMERICAN: 3 mL/min — AB (ref >60)
Glucose, Bld: 86 mg/dL (ref 70–99)
Potassium: 5 mmol/L (ref 3.5–5.1)
SODIUM: 134 mmol/L — AB (ref 135–145)
Total Bilirubin: 1 mg/dL (ref 0.3–1.2)
Total Protein: 7.5 g/dL (ref 6.5–8.1)

## 2018-04-24 LAB — CBC
HCT: 24.2 % — ABNORMAL LOW (ref 39.0–52.0)
Hemoglobin: 7.6 g/dL — ABNORMAL LOW (ref 13.0–17.0)
MCH: 27.2 pg (ref 26.0–34.0)
MCHC: 31.4 g/dL (ref 30.0–36.0)
MCV: 86.7 fL (ref 78.0–100.0)
Platelets: 209 10*3/uL (ref 150–400)
RBC: 2.79 MIL/uL — ABNORMAL LOW (ref 4.22–5.81)
RDW: 17.3 % — ABNORMAL HIGH (ref 11.5–15.5)
WBC: 5.9 10*3/uL (ref 4.0–10.5)
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
## 2018-04-25 ENCOUNTER — Other Ambulatory Visit: Payer: Self-pay

## 2018-04-25 ENCOUNTER — Encounter (HOSPITAL_COMMUNITY): Payer: Self-pay | Admitting: Nephrology

## 2018-04-25 ENCOUNTER — Emergency Department (HOSPITAL_COMMUNITY): Payer: Medicare Other

## 2018-04-25 DIAGNOSIS — M79671 Pain in right foot: Secondary | ICD-10-CM | POA: Diagnosis not present

## 2018-04-25 DIAGNOSIS — N19 Unspecified kidney failure: Secondary | ICD-10-CM | POA: Diagnosis present

## 2018-04-25 DIAGNOSIS — J90 Pleural effusion, not elsewhere classified: Secondary | ICD-10-CM | POA: Diagnosis not present

## 2018-04-25 DIAGNOSIS — S99921A Unspecified injury of right foot, initial encounter: Secondary | ICD-10-CM | POA: Diagnosis not present

## 2018-04-25 DIAGNOSIS — Z992 Dependence on renal dialysis: Secondary | ICD-10-CM

## 2018-04-25 DIAGNOSIS — N186 End stage renal disease: Secondary | ICD-10-CM

## 2018-04-25 HISTORY — DX: Unspecified kidney failure: N19

## 2018-04-25 LAB — CBC
HCT: 22.7 % — ABNORMAL LOW (ref 39.0–52.0)
Hemoglobin: 7.3 g/dL — ABNORMAL LOW (ref 13.0–17.0)
MCH: 27.4 pg (ref 26.0–34.0)
MCHC: 32.2 g/dL (ref 30.0–36.0)
MCV: 85.3 fL (ref 78.0–100.0)
PLATELETS: 205 10*3/uL (ref 150–400)
RBC: 2.66 MIL/uL — ABNORMAL LOW (ref 4.22–5.81)
RDW: 17.2 % — AB (ref 11.5–15.5)
WBC: 5.9 10*3/uL (ref 4.0–10.5)

## 2018-04-25 LAB — RENAL FUNCTION PANEL
Albumin: 2.6 g/dL — ABNORMAL LOW (ref 3.5–5.0)
Anion gap: 18 — ABNORMAL HIGH (ref 5–15)
BUN: 99 mg/dL — AB (ref 6–20)
CO2: 14 mmol/L — ABNORMAL LOW (ref 22–32)
Calcium: 8.3 mg/dL — ABNORMAL LOW (ref 8.9–10.3)
Chloride: 102 mmol/L (ref 98–111)
Creatinine, Ser: 18.46 mg/dL — ABNORMAL HIGH (ref 0.61–1.24)
GFR calc Af Amer: 3 mL/min — ABNORMAL LOW (ref >60)
GFR, EST NON AFRICAN AMERICAN: 2 mL/min — AB (ref >60)
Glucose, Bld: 105 mg/dL — ABNORMAL HIGH (ref 70–99)
Phosphorus: 6.4 mg/dL — ABNORMAL HIGH (ref 2.5–4.6)
Potassium: 4.9 mmol/L (ref 3.5–5.1)
Sodium: 134 mmol/L — ABNORMAL LOW (ref 135–145)

## 2018-04-25 MED ORDER — RENA-VITE PO TABS
1.0000 | ORAL_TABLET | Freq: Every day | ORAL | Status: DC
  Filled 2018-04-25: qty 1

## 2018-04-25 MED ORDER — LANTHANUM CARBONATE 500 MG PO CHEW
1000.0000 mg | CHEWABLE_TABLET | Freq: Three times a day (TID) | ORAL | Status: DC
  Administered 2018-04-26 – 2018-04-27 (×2): 1000 mg via ORAL
  Filled 2018-04-25 (×2): qty 2

## 2018-04-25 MED ORDER — HYDRALAZINE HCL 20 MG/ML IJ SOLN
10.0000 mg | Freq: Once | INTRAMUSCULAR | Status: DC
  Filled 2018-04-25: qty 1

## 2018-04-25 MED ORDER — ZOLPIDEM TARTRATE 5 MG PO TABS
5.0000 mg | ORAL_TABLET | Freq: Every day | ORAL | Status: DC
  Administered 2018-04-26: 5 mg via ORAL
  Filled 2018-04-25 (×2): qty 1

## 2018-04-25 MED ORDER — ONDANSETRON HCL 4 MG/2ML IJ SOLN
4.0000 mg | Freq: Three times a day (TID) | INTRAMUSCULAR | Status: DC | PRN
  Administered 2018-04-25: 4 mg via INTRAVENOUS
  Filled 2018-04-25: qty 2

## 2018-04-25 MED ORDER — SODIUM CHLORIDE 0.9 % IV SOLN: 100.0000 mL | INTRAVENOUS | Status: DC | PRN

## 2018-04-25 MED ORDER — HEPARIN SODIUM (PORCINE) 1000 UNIT/ML DIALYSIS: 1000.0000 [IU] | INTRAMUSCULAR | Status: DC | PRN

## 2018-04-25 MED ORDER — LIDOCAINE HCL (PF) 1 % IJ SOLN: 5.0000 mL | INTRAMUSCULAR | Status: DC | PRN

## 2018-04-25 MED ORDER — NEPHRO-VITE RX 1 MG PO TABS: 1.0000 | ORAL_TABLET | Freq: Every day | ORAL | Status: DC

## 2018-04-25 MED ORDER — SODIUM CHLORIDE 0.9% FLUSH: 3.0000 mL | Freq: Two times a day (BID) | INTRAVENOUS | Status: DC

## 2018-04-25 MED ORDER — DARBEPOETIN ALFA 200 MCG/0.4ML IJ SOSY
200.0000 ug | PREFILLED_SYRINGE | INTRAMUSCULAR | Status: DC
  Filled 2018-04-25: qty 0.4

## 2018-04-25 MED ORDER — AMLODIPINE BESYLATE 5 MG PO TABS
5.0000 mg | ORAL_TABLET | Freq: Every day | ORAL | Status: DC
  Administered 2018-04-26 – 2018-04-27 (×2): 5 mg via ORAL
  Filled 2018-04-25 (×3): qty 1

## 2018-04-25 MED ORDER — SODIUM CHLORIDE 0.9% FLUSH: 3.0000 mL | INTRAVENOUS | Status: DC | PRN

## 2018-04-25 MED ORDER — ZOLPIDEM TARTRATE 5 MG PO TABS
5.0000 mg | ORAL_TABLET | Freq: Every evening | ORAL | Status: DC | PRN
  Administered 2018-04-25: 5 mg via ORAL

## 2018-04-25 MED ORDER — DOXERCALCIFEROL 4 MCG/2ML IV SOLN
5.0000 ug | INTRAVENOUS | Status: DC
  Administered 2018-04-27: 5 ug via INTRAVENOUS
  Filled 2018-04-25: qty 4

## 2018-04-25 MED ORDER — DICLOFENAC SODIUM 1 % TD GEL: 4.0000 g | Freq: Four times a day (QID) | TRANSDERMAL | Status: DC

## 2018-04-25 MED ORDER — SODIUM CHLORIDE 0.9 % IV SOLN: 250.0000 mL | INTRAVENOUS | Status: DC | PRN

## 2018-04-25 MED ORDER — NITROGLYCERIN 0.4 MG SL SUBL: 0.4000 mg | SUBLINGUAL_TABLET | SUBLINGUAL | Status: DC | PRN

## 2018-04-25 MED ORDER — PROMETHAZINE HCL 25 MG PO TABS: 25.0000 mg | ORAL_TABLET | Freq: Four times a day (QID) | ORAL | Status: DC | PRN

## 2018-04-25 MED ORDER — GABAPENTIN 300 MG PO CAPS
300.0000 mg | ORAL_CAPSULE | Freq: Every evening | ORAL | Status: DC
  Administered 2018-04-26: 300 mg via ORAL
  Filled 2018-04-25: qty 1

## 2018-04-25 MED ORDER — ONDANSETRON HCL 4 MG PO TABS: 8.0000 mg | ORAL_TABLET | Freq: Three times a day (TID) | ORAL | Status: DC | PRN

## 2018-04-25 MED ORDER — HYDRALAZINE HCL 50 MG PO TABS
100.0000 mg | ORAL_TABLET | Freq: Three times a day (TID) | ORAL | Status: DC
  Administered 2018-04-25 – 2018-04-26 (×3): 100 mg via ORAL
  Filled 2018-04-25 (×4): qty 2

## 2018-04-25 MED ORDER — HYDRALAZINE HCL 20 MG/ML IJ SOLN: 5.0000 mg | INTRAMUSCULAR | Status: DC | PRN

## 2018-04-25 MED ORDER — PANTOPRAZOLE SODIUM 40 MG PO TBEC
40.0000 mg | DELAYED_RELEASE_TABLET | Freq: Every day | ORAL | Status: DC
  Administered 2018-04-26 – 2018-04-27 (×2): 40 mg via ORAL
  Filled 2018-04-25 (×3): qty 1

## 2018-04-25 MED ORDER — PENTAFLUOROPROP-TETRAFLUOROETH EX AERO: 1.0000 "application " | INHALATION_SPRAY | CUTANEOUS | Status: DC | PRN

## 2018-04-25 MED ORDER — NEPHRO VITAMINS 0.8 MG PO TABS: 1.0000 | ORAL_TABLET | Freq: Every day | ORAL | Status: DC

## 2018-04-25 MED ORDER — NITROGLYCERIN 2 % TD OINT
1.0000 [in_us] | TOPICAL_OINTMENT | Freq: Once | TRANSDERMAL | Status: AC
  Administered 2018-04-25: 1 [in_us] via TOPICAL
  Filled 2018-04-25: qty 1

## 2018-04-25 MED ORDER — HEPARIN SODIUM (PORCINE) 5000 UNIT/ML IJ SOLN
5000.0000 [IU] | Freq: Three times a day (TID) | INTRAMUSCULAR | Status: DC
  Filled 2018-04-25: qty 1

## 2018-04-25 MED ORDER — CARVEDILOL 25 MG PO TABS
25.0000 mg | ORAL_TABLET | Freq: Two times a day (BID) | ORAL | Status: DC
  Administered 2018-04-25 – 2018-04-26 (×3): 25 mg via ORAL
  Filled 2018-04-25 (×3): qty 1

## 2018-04-25 MED ORDER — LANTHANUM CARBONATE 500 MG PO CHEW: 1000.0000 mg | CHEWABLE_TABLET | Freq: Three times a day (TID) | ORAL | Status: DC

## 2018-04-25 MED ORDER — LIDOCAINE-PRILOCAINE 2.5-2.5 % EX CREA: 1.0000 "application " | TOPICAL_CREAM | CUTANEOUS | Status: DC | PRN

## 2018-04-25 NOTE — ED Notes (Signed)
Removed himself from monitor once again.

## 2018-04-25 NOTE — ED Notes (Signed)
Dr Blaine Hamper made aware that patient still has no IV access and elevated BP.  Ordered Ntg paste at this time and okay given to take to the floor.  Second IV start team to try after arrival to the floor.
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
## 2018-04-25 NOTE — ED Notes (Signed)
Report given to the floor.  To take up once IV team gets IV.

## 2018-04-25 NOTE — ED Notes (Signed)
Taken to xray at this time.

## 2018-04-25 NOTE — ED Notes (Signed)
Pt refusing vitals at this time. Pt informed the importance of continuing to monitor his vitals. Pt refuses with colorful vocabulary.

## 2018-04-25 NOTE — Care Management (Signed)
This is a no charge note  Pending admission per Dr. Stark Jock  52 year old man with past medical history for ESRD-HD (MWF), noncompliance to dialysis, hypertension, diet-controlled diabetes, GERD, CHF with EF of 35%, DVT, who presents with foot pain.  Patient complains of right foot pain after foot injury, but x-ray of right foot has no bony fracture.  No signs of infection per ED physician.  Patient has not done dialysis for 7 days, found to have uremia, with potassium 5.0, bicarbonate 16, creatinine 17.9, BUN 94, AG 18.  Patient has mild shortness of breath, but does not have acute respiratory distress.  Oxygen saturation 92 on room air.  Placed on telemetry bed for observation.  Please call renal in the morning for dialysis.      Ivor Costa, MD  [facility] Pager [phone]  If 7PM-7AM, please contact night-coverage [email or web address] Password [email or web address] 04/25/2018, 3:30 AM

## 2018-04-26 DIAGNOSIS — Z992 Dependence on renal dialysis: Secondary | ICD-10-CM | POA: Diagnosis not present

## 2018-04-26 DIAGNOSIS — E1129 Type 2 diabetes mellitus with other diabetic kidney complication: Secondary | ICD-10-CM

## 2018-04-26 DIAGNOSIS — I428 Other cardiomyopathies: Secondary | ICD-10-CM

## 2018-04-26 DIAGNOSIS — E1165 Type 2 diabetes mellitus with hyperglycemia: Secondary | ICD-10-CM | POA: Diagnosis not present

## 2018-04-26 DIAGNOSIS — N186 End stage renal disease: Secondary | ICD-10-CM | POA: Diagnosis not present

## 2018-04-26 DIAGNOSIS — N19 Unspecified kidney failure: Secondary | ICD-10-CM | POA: Diagnosis not present

## 2018-04-26 LAB — PROTIME-INR
INR: 1.36
PROTHROMBIN TIME: 16.6 s — AB (ref 11.4–15.2)

## 2018-04-26 LAB — BASIC METABOLIC PANEL
Anion gap: 13 (ref 5–15)
BUN: 47 mg/dL — AB (ref 6–20)
CHLORIDE: 98 mmol/L (ref 98–111)
CO2: 24 mmol/L (ref 22–32)
Calcium: 8.1 mg/dL — ABNORMAL LOW (ref 8.9–10.3)
Creatinine, Ser: 10.91 mg/dL — ABNORMAL HIGH (ref 0.61–1.24)
GFR calc Af Amer: 5 mL/min — ABNORMAL LOW (ref >60)
GFR calc non Af Amer: 5 mL/min — ABNORMAL LOW (ref >60)
GLUCOSE: 153 mg/dL — AB (ref 70–99)
POTASSIUM: 3.7 mmol/L (ref 3.5–5.1)
SODIUM: 135 mmol/L (ref 135–145)

## 2018-04-26 LAB — IRON AND TIBC
Iron: 34 ug/dL — ABNORMAL LOW (ref 45–182)
SATURATION RATIOS: 22 % (ref 17.9–39.5)
TIBC: 154 ug/dL — AB (ref 250–450)
UIBC: 120 ug/dL

## 2018-04-26 LAB — FERRITIN: Ferritin: 301 ng/mL (ref 24–336)

## 2018-04-26 MED ORDER — APIXABAN 5 MG PO TABS
5.0000 mg | ORAL_TABLET | Freq: Two times a day (BID) | ORAL | Status: DC
  Administered 2018-04-26 – 2018-04-27 (×3): 5 mg via ORAL
  Filled 2018-04-26 (×3): qty 1

## 2018-04-26 MED ORDER — CHLORHEXIDINE GLUCONATE CLOTH 2 % EX PADS: 6.0000 | MEDICATED_PAD | Freq: Every day | CUTANEOUS | Status: DC

## 2018-04-26 MED ORDER — ALPRAZOLAM 0.25 MG PO TABS
0.2500 mg | ORAL_TABLET | Freq: Two times a day (BID) | ORAL | Status: DC | PRN
  Administered 2018-04-26: 0.25 mg via ORAL
  Filled 2018-04-26: qty 1

## 2018-04-26 MED ORDER — INSULIN ASPART 100 UNIT/ML ~~LOC~~ SOLN: 0.0000 [IU] | Freq: Three times a day (TID) | SUBCUTANEOUS | Status: DC

## 2018-04-26 NOTE — Progress Notes (Signed)
Refused to have his medicines,telling this Probation officer that he still waiting for his food.

## 2018-04-26 NOTE — Progress Notes (Signed)
Patient been off from telemetry for an hour ,tried two times now to put it back.Patient said later on,writer explained to him he's been off from the monitor and explained the purpose.Patient said you can placed it back later on.The writer said we need to monitor your heart,in case we you will have the problem with your heart and your monitor is off from you?we could not even know it.Patient said ,you mean that I need to put it now?Glass blower/designer said "yes and you been off for an hour now.The patient said "so I don't have any option left? The said " it is for your safety ".You can refuse for it ,but you compromise your safety.The patient said "I refused on it" and where is my [place] sandwich? The writer said ''we don't have any [place] in the fridge but I have your Ginger Ale here.Patient is responded back where is my lunch tray.? There are on the way but I don't have precise time what time they will be here.Patient said '' get out and don't come back with that attitude.The writer said''im just answering you on a matter of fact for you and the writer went out the room.

## 2018-04-26 NOTE — Progress Notes (Signed)
PROGRESS NOTE  Frank [name] ZHG:[id number] DOB: 1965-12-01 DOA: 04/24/2018 PCP: Patient, [name]  Brief Narrative: 52 year old man PMH ESRD, noncompliance, diabetes, systolic CHF presented with foot pain.  Had not had dialysis for 7 days.  Recently hospitalized at [facility] for C. difficile colitis.  Found to be uremic.  Admitted for urgent hemodialysis to treat uremia and volume overload.  Assessment/Plan ESRD with volume overload secondary noncompliance/missed treatments with hemodialysis.  5 L removed 10/5. --Improved.  Next dialysis tomorrow.  Anemia of end-stage renal disease, appears to be stable.  Was 8.8 on discharge from [facility] 9/18.  Diarrhea, recent C. difficile infection --None during this hospitalization.  No evidence of infection.  Diabetes mellitus type 2 --Start sliding scale insulin.  Chronic systolic, diastolic CHF; nonischemic cardiomyopathy --Appears compensated.  Well-documented history of drug-seeking and disruptive behavior per EDP note   Likely home tomorrow if tolerating diet, no vomiting.  DVT prophylaxis: heparin Code Status: full Family Communication: none Disposition Plan: home    Murray Hodgkins, MD  [facility] Direct contact: [phone] --[address]ia [place]  --[email or web address]; password [email or web address]  7PM-7AM contact night coverage as above 04/26/2018, 1:46 PM  LOS: 0 days   Consultants:  Nephrology   Procedures:  HD  Antimicrobials:    Interval history/Subjective: Nausea and vomiting today, hasn't eaten anything today. Reports chronic back pain.  Objective: Vitals:  Vitals:   04/26/18 0558 04/26/18 0758  BP: (!) 139/99 (!) 140/97  Pulse: 70 72  Resp: 18 20  Temp: 97.6 F (36.4 C) (!) 97.5 F (36.4 C)  SpO2: 100% 97%    Exam:  Constitutional:  . Appears calm, uncomfortable but not toxic Respiratory:  . CTA bilaterally, no w/r/r.  . Respiratory effort normal.  Cardiovascular:  . RRR, no m/r/g . No LE extremity  edema   . Telemetry SR Psychiatric:  . Mental status o Mood, affect appropriate  I have personally reviewed the following:   Data: . I/O o -5 L with hemodialysis o BM: x1, no diarrhea  CBG: Random blood sugar 153.  Anion gap within normal limits.  Labs: BMP unremarkable, noted elevated creatinine consistent with end-stage renal disease.  Potassium within normal limits.  Hemoglobin stable 7.3.  Scheduled Meds: . amLODipine  5 mg Oral Daily  . carvedilol  25 mg Oral BID WC  . Chlorhexidine Gluconate Cloth  6 each Topical Q0600  . [START ON 04/27/2018] darbepoetin (ARANESP) injection - DIALYSIS  200 mcg Intravenous Q Mon-HD  . [START ON 04/27/2018] doxercalciferol  5 mcg Intravenous Q M,W,F-HD  . gabapentin  300 mg Oral QPM  . heparin  5,000 Units Subcutaneous Q8H  . hydrALAZINE  10 mg Intravenous Once  . hydrALAZINE  100 mg Oral TID  . lanthanum  1,000 mg Oral TID WC  . multivitamin  1 tablet Oral QHS  . pantoprazole  40 mg Oral Daily  . sodium chloride flush  3 mL Intravenous Q12H  . zolpidem  5 mg Oral QHS   Continuous Infusions: . sodium chloride      Principal Problem:   Uremia Active Problems:   Anemia of chronic kidney failure   DM (diabetes mellitus), type 2, uncontrolled, with renal complications (HCC)   Nonischemic cardiomyopathy (HCC)   Benign hypertensive heart and kidney disease with systolic CHF, NYHA class 3 and CKD stage 5 ([place])   End-stage renal disease on hemodialysis [facility])   Dialysis patient, noncompliant ([place])   Foot pain, right   LOS: 0 days

## 2018-04-26 NOTE — Progress Notes (Signed)
Approached by Lab Tech for blood draw,tellingme that patient either refusing the lab work or telling them to come back.Awaken patient and explained the purpose.Patient said come back after ten minutes,ten passed ,we come back patient said come back after 30 minutes.

## 2018-04-26 NOTE — Progress Notes (Signed)
No issue of loose bowel movement,nausea and vomiting for last 12 hours.Patient ate his meal trays 100%.

## 2018-04-26 NOTE — Progress Notes (Signed)
Send the N.T. away and called her bitch.The writer when to his room and rminded patient that we 're all helping himand we do not deserve that kind of treatment from him.Patient said ''I was requesting ginger ale from her and warm blanket and she did not bring it to me.Reminded patient that I was bringing him his needs and he is on fluid restriction.

## 2018-04-26 NOTE — Progress Notes (Signed)
[facility] Progress Note  Subjective: 5L off yest on HD, feeling better today. No diarrhea overnight.   Vitals:   04/25/18 1757 04/25/18 2228 04/26/18 0558 04/26/18 0758  BP: (!) 158/100 133/88 (!) 139/99 (!) 140/97  Pulse: 80 81 70 72  Resp: _0 Temp: 98.4 F (36.9 C) 98.5 F (36.9 C) 97.6 F (36.4 C) (!) 97.5 F (36.4 C)  TempSrc: Oral Oral Oral Oral  SpO2: 95% 93% 100% 97%  Weight:        Inpatient medications: . amLODipine  5 mg Oral Daily  . carvedilol  25 mg Oral BID WC  . [START ON 04/27/2018] darbepoetin (ARANESP) injection - DIALYSIS  200 mcg Intravenous Q Mon-HD  . [START ON 04/27/2018] doxercalciferol  5 mcg Intravenous Q M,W,F-HD  . gabapentin  300 mg Oral QPM  . heparin  5,000 Units Subcutaneous Q8H  . hydrALAZINE  10 mg Intravenous Once  . hydrALAZINE  100 mg Oral TID  . lanthanum  1,000 mg Oral TID WC  . multivitamin  1 tablet Oral QHS  . pantoprazole  40 mg Oral Daily  . sodium chloride flush  3 mL Intravenous Q12H  . zolpidem  5 mg Oral QHS   . sodium chloride     sodium chloride, hydrALAZINE, nitroGLYCERIN, ondansetron (ZOFRAN) IV, ondansetron, promethazine, sodium chloride flush, zolpidem  Iron/TIBC/Ferritin/ %Sat    Component Value Date/Time   IRON 41 (L) 05/29/2016 1350   TIBC 190 (L) 05/29/2016 1350   FERRITIN 328 11/02/2015 0346   IRONPCTSAT 22 05/29/2016 1350    Exam: Gen alert, chron ill , no acute changes No rash, cyanosis or gangrene No jvd no bruits Chest L clear, Rales R base RRR no MRG Abd soft ntnd no mass or ascites +bs MS no joint effusions or deformity Ext improved edema 1+ Neuro is alert, Ox 3 , nf    Home meds:  - amlodipine 5 mg qd/ carvedilol 25 bid/ hydralazine 100 tid  - gabapentin 300 hs/ lanthanum 1 gm tid/ pantoprazole 40 qd  - prn zolpidem 5/ sl ntg prn/ vitamins   Dialysis: [place] MWF  4h  2/2.25 bath  AVF  75kg   Hep none  - hect 5 ug tiw  - mircera 200 q 2  wks   Impression/ Plan: 1. Vol overload/ LE edema - from missed HD. Improved after HD Sat down 5L.  Up 2-3 kg now. If here tomorrow will plan usual HD on schedule.  2. Diarrhea/ N/V - no diarrhea here, had recent Cdif infection at [facility] per pt 3. HTN - cont meds 4. Anemia ckd 5. H/o recur pancreatitis - not issue now 6. H/o failed transplant - 2006> 2014 7. Dispo - stable for dc from renal standpoint    Kelly Splinter MD [facility] pager [phone]   04/26/2018, 9:46 AM   Recent Labs  Lab 04/24/18 2254 04/25/18 1121  NA 134* 134*  K 5.0 4.9  CL 100 102  CO2 16* 14*  GLUCOSE 86 105*  BUN 94* 99*  CREATININE 17.90* 18.46*  CALCIUM 8.5* 8.3*  PHOS  --  6.4*  ALBUMIN 2.8* 2.6*   Recent Labs  Lab 04/24/18 2254  AST 21  ALT 14  ALKPHOS 96  BILITOT 1.0  PROT 7.5   Recent Labs  Lab 04/24/18 2254 04/25/18 1120  WBC 5.9 5.9  HGB 7.6* 7.3*  HCT 24.2* 22.7*  MCV 86.7 85.3  PLT 209 205

## 2018-04-27 DIAGNOSIS — Z992 Dependence on renal dialysis: Secondary | ICD-10-CM | POA: Diagnosis not present

## 2018-04-27 DIAGNOSIS — E8779 Other fluid overload: Secondary | ICD-10-CM | POA: Diagnosis not present

## 2018-04-27 DIAGNOSIS — N19 Unspecified kidney failure: Secondary | ICD-10-CM | POA: Diagnosis not present

## 2018-04-27 DIAGNOSIS — N186 End stage renal disease: Secondary | ICD-10-CM | POA: Diagnosis not present

## 2018-04-27 LAB — RENAL FUNCTION PANEL
ALBUMIN: 2.3 g/dL — AB (ref 3.5–5.0)
Anion gap: 10 (ref 5–15)
BUN: 60 mg/dL — AB (ref 6–20)
CHLORIDE: 101 mmol/L (ref 98–111)
CO2: 23 mmol/L (ref 22–32)
CREATININE: 11.97 mg/dL — AB (ref 0.61–1.24)
Calcium: 7.6 mg/dL — ABNORMAL LOW (ref 8.9–10.3)
GFR calc Af Amer: 5 mL/min — ABNORMAL LOW (ref >60)
GFR, EST NON AFRICAN AMERICAN: 4 mL/min — AB (ref >60)
GLUCOSE: 135 mg/dL — AB (ref 70–99)
PHOSPHORUS: 4.4 mg/dL (ref 2.5–4.6)
Potassium: 3.7 mmol/L (ref 3.5–5.1)
Sodium: 134 mmol/L — ABNORMAL LOW (ref 135–145)

## 2018-04-27 LAB — CBC
HCT: 22.8 % — ABNORMAL LOW (ref 39.0–52.0)
Hemoglobin: 7.4 g/dL — ABNORMAL LOW (ref 13.0–17.0)
MCH: 27.4 pg (ref 26.0–34.0)
MCHC: 32.5 g/dL (ref 30.0–36.0)
MCV: 84.4 fL (ref 78.0–100.0)
PLATELETS: 203 10*3/uL (ref 150–400)
RBC: 2.7 MIL/uL — ABNORMAL LOW (ref 4.22–5.81)
RDW: 17.2 % — AB (ref 11.5–15.5)
WBC: 5.2 10*3/uL (ref 4.0–10.5)

## 2018-04-27 LAB — GLUCOSE, CAPILLARY: GLUCOSE-CAPILLARY: 135 mg/dL — AB (ref 70–99)

## 2018-04-27 MED ORDER — DARBEPOETIN ALFA 200 MCG/0.4ML IJ SOSY
PREFILLED_SYRINGE | INTRAMUSCULAR | Status: AC
  Administered 2018-04-27: 200 ug
  Filled 2018-04-27: qty 0.4

## 2018-04-27 MED ORDER — DOXERCALCIFEROL 4 MCG/2ML IV SOLN
INTRAVENOUS | Status: AC
  Filled 2018-04-27: qty 4

## 2018-04-27 NOTE — Care Management Obs Status (Signed)
[place] NOTIFICATION   Patient Details  Name: KELII CHITTUM MRN: [id number] Date of Birth: 04-May-1966   Medicare Observation Status Notification Given:  Yes    Bethena Roys, RN 04/27/2018, 1:03 PM

## 2018-04-27 NOTE — Progress Notes (Addendum)
[facility] Progress Note   Subjective:  Seen on HD. Feeling better today.  Denies n/v/d.  Getting ready to try and eat breakfast. To go home today.   Objective Vitals:   04/27/18 0730 04/27/18 0800 04/27/18 0830 04/27/18 0900  BP: 111/83 136/87 121/67 125/80  Pulse: 73 77 74 75  Resp:      Temp:      TempSrc:      SpO2:      Weight:       Physical Exam General:NAD, chronically ill appearing male Heart:RRR Lungs:nml WOB Extremities:trace LE edema Dialysis Access: LU AVF cannulated   Filed Weights   04/25/18 1105 04/25/18 1512 04/27/18 0700  Weight: 82.9 kg 77.5 kg 79.4 kg    Intake/Output Summary (Last 24 hours) at 04/27/2018 0924 Last data filed at 04/27/2018 0600 Gross per 24 hour  Intake 600 ml  Output 0 ml  Net 600 ml    Additional Objective Labs: Basic Metabolic Panel: Recent Labs  Lab 04/25/18 1121 04/26/18 1043 04/27/18 0700  NA 134* 135 134*  K 4.9 3.7 3.7  CL 102 98 101  CO2 14* 24 23  GLUCOSE 105* 153* 135*  BUN 99* 47* 60*  CREATININE 18.46* 10.91* 11.97*  CALCIUM 8.3* 8.1* 7.6*  PHOS 6.4*  --  4.4   Liver Function Tests: Recent Labs  Lab 04/24/18 2254 04/25/18 1121 04/27/18 0700  AST 21  --   --   ALT 14  --   --   ALKPHOS 96  --   --   BILITOT 1.0  --   --   PROT 7.5  --   --   ALBUMIN 2.8* 2.6* 2.3*   CBC: Recent Labs  Lab 04/24/18 2254 04/25/18 1120 04/27/18 0700  WBC 5.9 5.9 5.2  HGB 7.6* 7.3* 7.4*  HCT 24.2* 22.7* 22.8*  MCV 86.7 85.3 84.4  PLT 209 205 203    Recent Labs  Lab 04/26/18 1854  GLUCAP 135*   Iron Studies:  Recent Labs    04/26/18 1043  IRON 34*  TIBC 154*  FERRITIN 301   Lab Results  Component Value Date   INR 1.36 04/26/2018   INR 1.56 09/21/2017   INR 1.28 09/04/2017   Studies/Results: No results found.  Medications: . sodium chloride     . amLODipine  5 mg Oral Daily  . apixaban  5 mg Oral BID  . carvedilol  25 mg Oral BID WC  . Chlorhexidine Gluconate Cloth  6  each Topical Q0600  . darbepoetin (ARANESP) injection - DIALYSIS  200 mcg Intravenous Q Mon-HD  . doxercalciferol  5 mcg Intravenous Q M,W,F-HD  . gabapentin  300 mg Oral QPM  . hydrALAZINE  10 mg Intravenous Once  . hydrALAZINE  100 mg Oral TID  . insulin aspart  0-9 Units Subcutaneous TID WC  . lanthanum  1,000 mg Oral TID WC  . multivitamin  1 tablet Oral QHS  . pantoprazole  40 mg Oral Daily  . sodium chloride flush  3 mL Intravenous Q12H  . zolpidem  5 mg Oral QHS    Dialysis Orders: Home meds: - amlodipine 5 mg qd/ carvedilol 25 bid/ hydralazine 100 tid - gabapentin 300 hs/ lanthanum 1 gm tid/ pantoprazole 40 qd - prn zolpidem 5/ sl ntg prn/ vitamins   [place] MWF 4h 2/2.25 bath AVF 75kg Hep none - hect 5 ug tiw - mircera 200 q 2 wks  Assessment/Plan: 1. Volume overload/LE edema - 2/2  missed HD. Improved with dialysis, should meet EDW post HD. 2. Diarrhea/nausea/vomiting - improved. Per patient recent C diff at [facility].  No diarrhea since admission, precautions d/c.  3. ESRD - MWF - HD today per regular schedule. K 3.7.  Can resume dialysis at OP facility. 4. Anemia of CKD- Hgb 7.4, Tsat 22%.  Start Fe as OP. Continue mircera.  5.Secondary hyperparathyroidism - Phos and CCa in goal. Continue VDRA.   6. HTN - BP in goal.  Continue home meds.  7. Nutrition - Alb 2.3. Renal diet with fluid restrictions. Nepro. 8. Hx chronic pancreatitis  9. Hx failed renal transplant 2006-2014 10. Dispo - Stable for d/c from renal standpoint.   Jen Mow, PA-C [facility] Pager: [phone] 04/27/2018,9:24 AM  LOS: 0 days   Pt seen, examined and agree w A/P as above.  Kelly Splinter MD [employer] pager [phone]   04/27/2018, 11:59 AM

## 2018-04-27 NOTE — Progress Notes (Signed)
Pt. Standing weight pre-dialysis. Pt refused post standing weight. MD aware

## 2018-04-27 NOTE — Discharge Summary (Signed)
Physician Discharge Summary  Frank [name]:[id number] DOB: July 02, 1966 DOA: 04/24/2018  PCP: Patient, [name]  Admit date: 04/24/2018 Discharge date: 04/27/2018  Recommendations for Outpatient Follow-up:  1. Ongoing care     Discharge Diagnoses:  1. ESRD with volume overload secondary to  noncompliance/missed treatment 2. Anemia of end-stage renal disease 3. Reported diarrhea, recent C. difficile infection. 4. Diabetes mellitus type 2 5. Chronic systolic, diastolic CHF; nonischemic cardiomyopathy  Discharge Condition: improved Disposition: home  Diet recommendation: heart healthy, renal diet, low carb  Filed Weights   04/25/18 1512 04/27/18 0700 04/27/18 1116  Weight: 77.5 kg 79.4 kg 76.1 kg    History of present illness:  52 year old man PMH ESRD, noncompliance, diabetes, systolic CHF presented with foot pain.  Had not had dialysis for 7 days.  Recently hospitalized at [facility] for C. difficile colitis.  Found to be uremic.  Admitted for urgent hemodialysis to treat uremia and volume overload.  Hospital Course:  Patient underwent hemodialysis with significant reduction in fluid overload.  Hospitalization was uncomplicated.  Individual issues as below.  ESRD with volume overload secondary noncompliance/missed treatments with hemodialysis.  5 L removed 10/5. --Continues to improve.  Estimated to meet EDW post HD.  Clear for discharge per nephrology.  Anemia of end-stage renal disease, appears to be stable.  Was 8.8 on discharge from [facility] 9/18. --Stable. Continue Mircera  Reported diarrhea, recent C. difficile infection. --No diarrhea during hospitalization.  Diabetes mellitus type 2 --CBG stable.  Chronic systolic, diastolic CHF; nonischemic cardiomyopathy --Stable.  Consultants:  Nephrology   Procedures:  HD  Today's assessment: S: feels better, wants lunch O: Vitals:  Vitals:   04/27/18 1100 04/27/18 1116  BP: 117/74 127/80  Pulse: 77 76    Resp:  18  Temp:  98.1 F (36.7 C)  SpO2:  98%    Constitutional:  . Appears calm and comfortable Respiratory:  . CTA bilaterally, no w/r/r.  . Respiratory effort normal.  Cardiovascular:  . RRR, no m/r/g Psychiatric:  . Mental status o Mood, affect appropriate  Hgb stable 7.4; P noted.BMP noted  Discharge Instructions  Discharge Instructions    Activity as tolerated - No restrictions   Complete by:  As directed    Discharge instructions   Complete by:  As directed    Call your physician or seek immediate medical attention for swelling, pain or worsening of condition. Renal diet, low salt.     Allergies as of 04/27/2018      Reactions   Butalbital-apap-caffeine Shortness Of Breath, Swelling, Other (See Comments)   Swelling in throat   Ferrlecit [na Ferric Gluc Cplx In Sucrose] Shortness Of Breath, Swelling, Other (See Comments)   Swelling in throat, tolerates Venofor   Minoxidil Shortness Of Breath   Tylenol [acetaminophen] Anaphylaxis, Swelling   Darvocet [propoxyphene N-acetaminophen] Hives      Medication List    TAKE these medications   ALPRAZolam 0.5 MG tablet Commonly known as:  XANAX Take 0.25 mg by mouth 2 (two) times daily as needed for anxiety.   amLODipine 5 MG tablet Commonly known as:  NORVASC Take 5 mg by mouth daily.   busPIRone 5 MG tablet Commonly known as:  BUSPAR Take 5 mg by mouth 2 (two) times daily.   carvedilol 25 MG tablet Commonly known as:  COREG Take 25 mg by mouth 2 (two) times daily with a meal.   cinacalcet 90 MG tablet Commonly known as:  SENSIPAR Take 90 mg by mouth  every evening.   diclofenac sodium 1 % Gel Commonly known as:  VOLTAREN Apply 4 gram transdermaly four times daily for left shoulder pain   dicyclomine 10 MG/5ML syrup Commonly known as:  BENTYL Take 5 mLs by mouth 4 (four) times daily as needed for spasms.   ELIQUIS 5 MG Tabs tablet Generic drug:  apixaban Take 5 mg by mouth 2 (two) times daily.    gabapentin 300 MG capsule Commonly known as:  NEURONTIN Take 300 mg by mouth every evening.   hydrALAZINE 100 MG tablet Commonly known as:  APRESOLINE Take 100 mg by mouth 3 (three) times daily.   lanthanum 1000 MG chewable tablet Commonly known as:  FOSRENOL Chew 1,000 mg by mouth 3 (three) times daily with meals.   lisinopril 5 MG tablet Commonly known as:  PRINIVIL,ZESTRIL Take 5 mg by mouth daily.   multivitamin Tabs tablet Take 1 tablet by mouth daily.   B complex-vitamin C-folic acid 1 MG tablet Take 1 tablet by mouth at bedtime.   NEPHRO VITAMINS 0.8 MG Tabs Take 1 tablet by mouth daily.   nitroGLYCERIN 0.4 MG SL tablet Commonly known as:  NITROSTAT Place 0.4 mg under the tongue every 5 (five) minutes as needed for chest pain.   NUTRITIONAL SUPPLEMENT PO Liberalized Renal Diet - Regular Texture   ondansetron 8 MG tablet Commonly known as:  ZOFRAN Take 8 mg by mouth every 8 (eight) hours as needed for nausea or vomiting.   oxyCODONE 5 MG immediate release tablet Commonly known as:  Oxy IR/ROXICODONE Take 5 mg by mouth every 6 (six) hours as needed for pain.   Oxycodone HCl 10 MG Tabs Take 10 mg by mouth every 6 (six) hours as needed for pain.   OXYGEN O2 via nasal cannula at 2L/min for O2 sats 90% or below as needed for shortness of breath   pantoprazole 40 MG tablet Commonly known as:  PROTONIX Take 1 tablet (40 mg total) by mouth daily.   promethazine 25 MG tablet Commonly known as:  PHENERGAN Take 25 mg by mouth every 6 (six) hours as needed for nausea or vomiting.   zolpidem 5 MG tablet Commonly known as:  AMBIEN Take 1 tablet (5 mg total) by mouth at bedtime.      Allergies  Allergen Reactions  . Butalbital-Apap-Caffeine Shortness Of Breath, Swelling and Other (See Comments)    Swelling in throat  . Ferrlecit [Na Ferric Gluc Cplx In Sucrose] Shortness Of Breath, Swelling and Other (See Comments)    Swelling in throat, tolerates Venofor  .  Minoxidil Shortness Of Breath  . Tylenol [Acetaminophen] Anaphylaxis and Swelling  . Darvocet [Propoxyphene N-Acetaminophen] Hives    The results of significant diagnostics from this hospitalization (including imaging, microbiology, ancillary and laboratory) are listed below for reference.    Significant Diagnostic Studies: Dg Chest 2 View  Result Date: 04/25/2018 CLINICAL DATA:  Back pain. EXAM: CHEST - 2 VIEW COMPARISON:  April 08, 2018 FINDINGS: There is a moderate right-sided pleural effusion with underlying opacity. Stable cardiomegaly. Mild pulmonary venous congestion without overt edema. No other acute abnormalities. IMPRESSION: Cardiomegaly, right moderate pleural effusion, and pulmonary venous congestion. Electronically Signed   By: Dorise Bullion III M.D   On: 04/25/2018 02:48   Dg Foot Complete Right  Result Date: 04/25/2018 CLINICAL DATA:  52 y/o M; injury to right foot with pain along the dorsal side. EXAM: RIGHT FOOT COMPLETE - 3+ VIEW COMPARISON:  None. FINDINGS: There is no evidence of  fracture or dislocation. There is no evidence of arthropathy or other focal bone abnormality. Vascular calcifications noted. IMPRESSION: No acute bony or articular abnormality identified. Electronically Signed   By: Kristine Garbe M.D.   On: 04/25/2018 02:51   Labs: Basic Metabolic Panel: Recent Labs  Lab 04/24/18 2254 04/25/18 1121 04/26/18 1043 04/27/18 0700  NA 134* 134* 135 134*  K 5.0 4.9 3.7 3.7  CL 100 102 98 101  CO2 16* 14* 24 23  GLUCOSE 86 105* 153* 135*  BUN 94* 99* 47* 60*  CREATININE 17.90* 18.46* 10.91* 11.97*  CALCIUM 8.5* 8.3* 8.1* 7.6*  PHOS  --  6.4*  --  4.4   Liver Function Tests: Recent Labs  Lab 04/24/18 2254 04/25/18 1121 04/27/18 0700  AST 21  --   --   ALT 14  --   --   ALKPHOS 96  --   --   BILITOT 1.0  --   --   PROT 7.5  --   --   ALBUMIN 2.8* 2.6* 2.3*   CBC: Recent Labs  Lab 04/24/18 2254 04/25/18 1120 04/27/18 0700  WBC  5.9 5.9 5.2  HGB 7.6* 7.3* 7.4*  HCT 24.2* 22.7* 22.8*  MCV 86.7 85.3 84.4  PLT 209 205 203   CBG: Recent Labs  Lab 04/26/18 1854  GLUCAP 135*    Principal Problem:   Uremia Active Problems:   Anemia of chronic kidney failure   DM (diabetes mellitus), type 2, uncontrolled, with renal complications (HCC)   Nonischemic cardiomyopathy (HCC)   Benign hypertensive heart and kidney disease with systolic CHF, NYHA class 3 and CKD stage 5 ([place])   End-stage renal disease on hemodialysis [facility])   Dialysis patient, noncompliant ([place])   Foot pain, right   Time coordinating discharge: 20 minutes  Signed:  Murray Hodgkins, MD [facility] 04/27/2018, 12:17 PM

## 2018-04-28 IMAGING — US US ABDOMEN COMPLETE
1 series · 13 of 25 positions shown · non-contrast
Comparison: CT abdomen and pelvis 08/25/2017

CLINICAL DATA: Abdominal distension. History of failed renal
transplant.

EXAM:
ABDOMEN ULTRASOUND COMPLETE

[Series 1: us abdomen complete · 0.19mm/px · 13 of 78 slices shown]
[im 1/78]
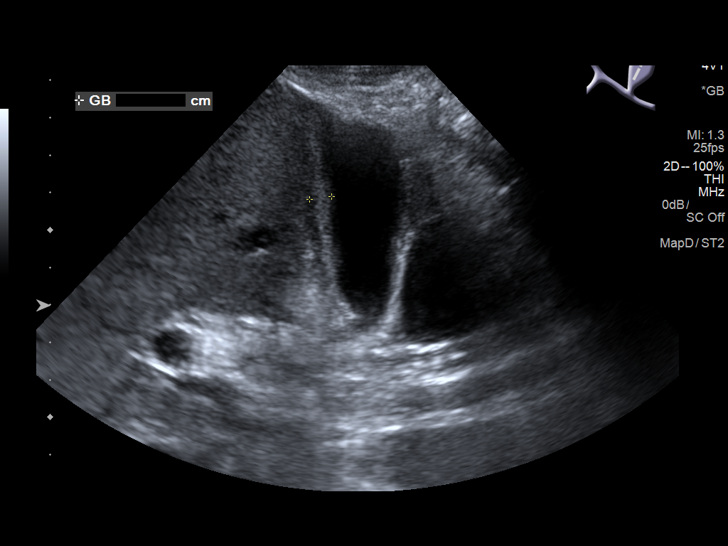
[im 7/78]
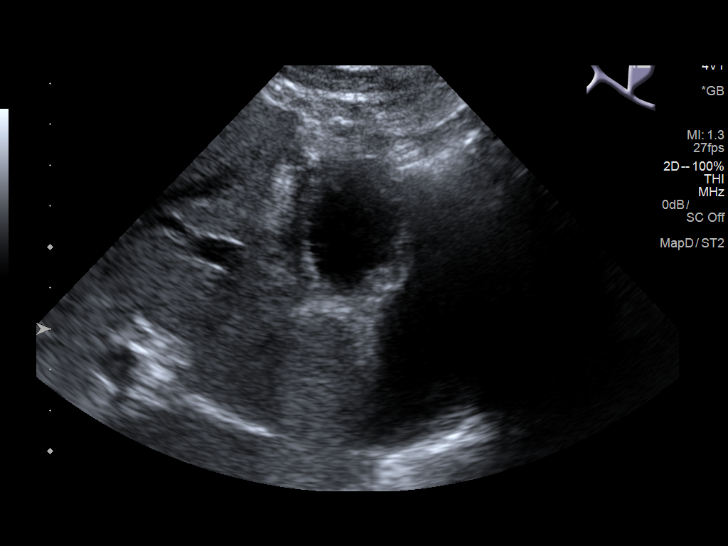
[im 13/78]
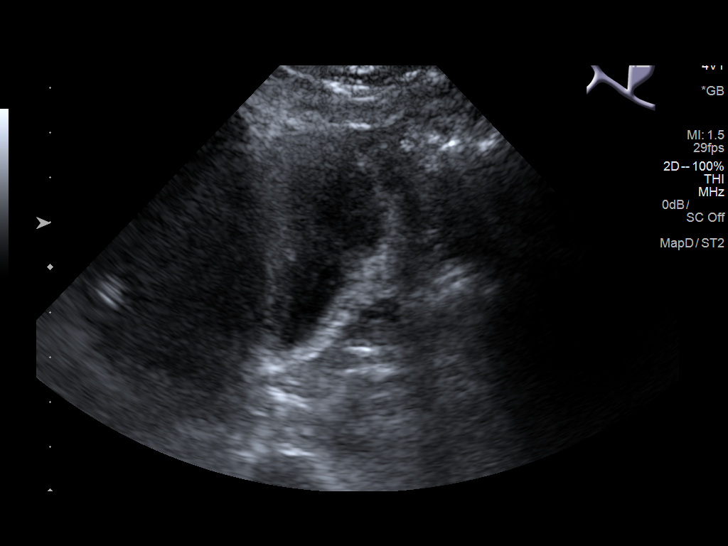
[im 20/78]
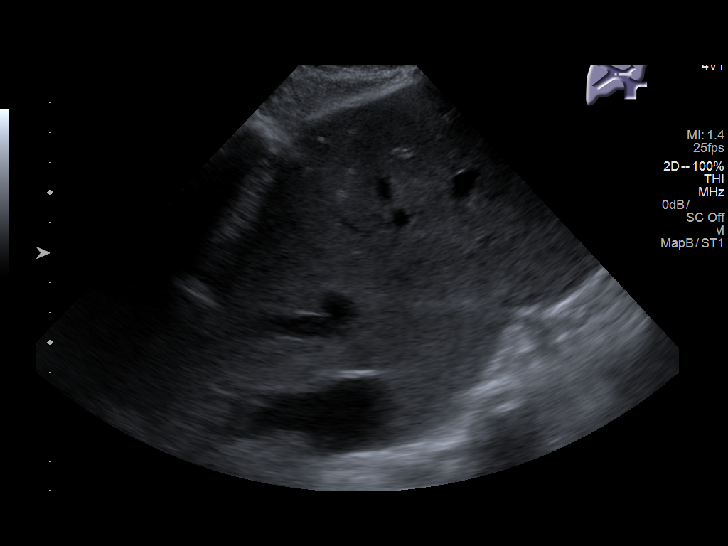
[im 26/78]
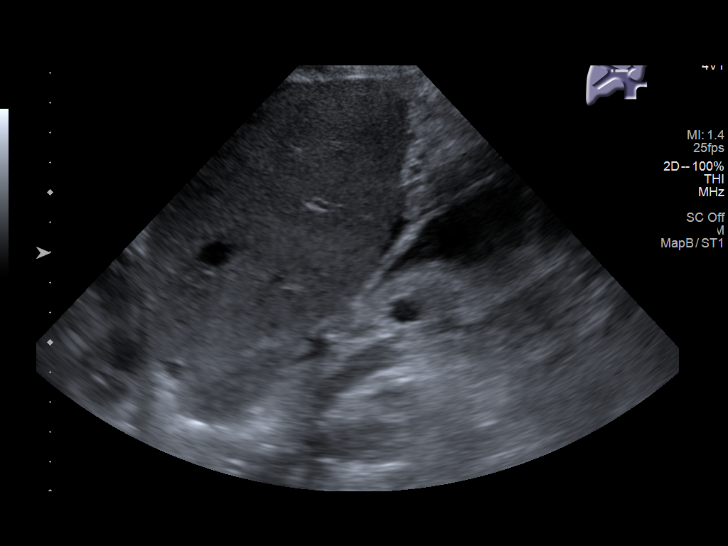
[im 33/78]
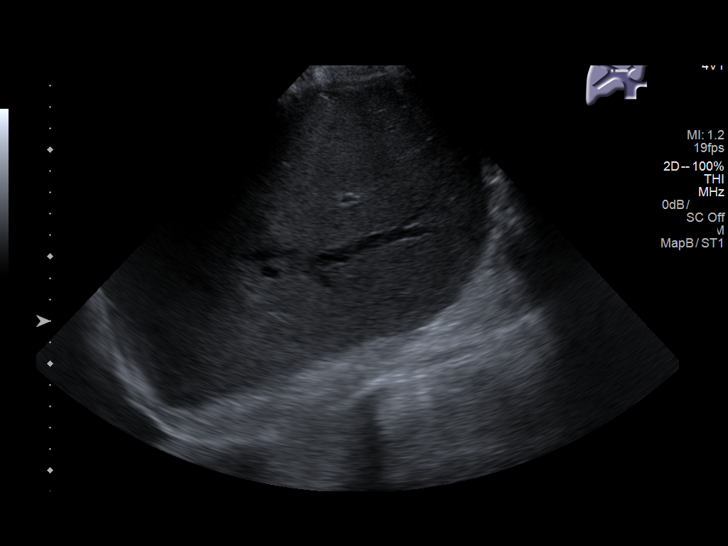
[im 39/78]
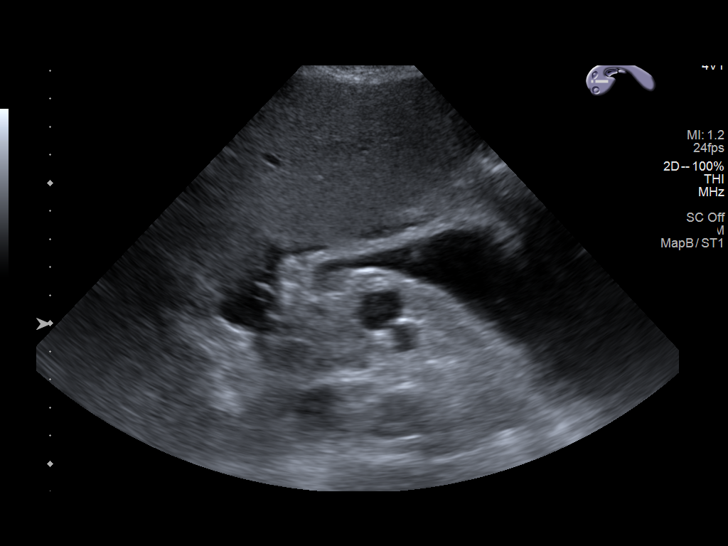
[im 45/78]
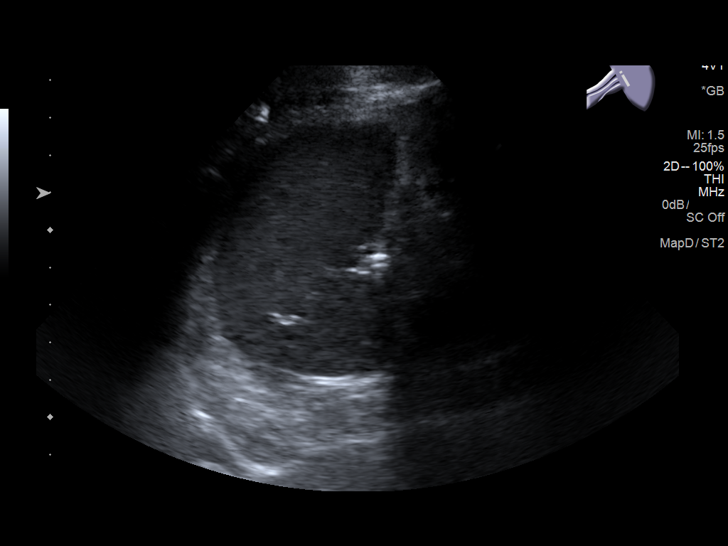
[im 52/78]
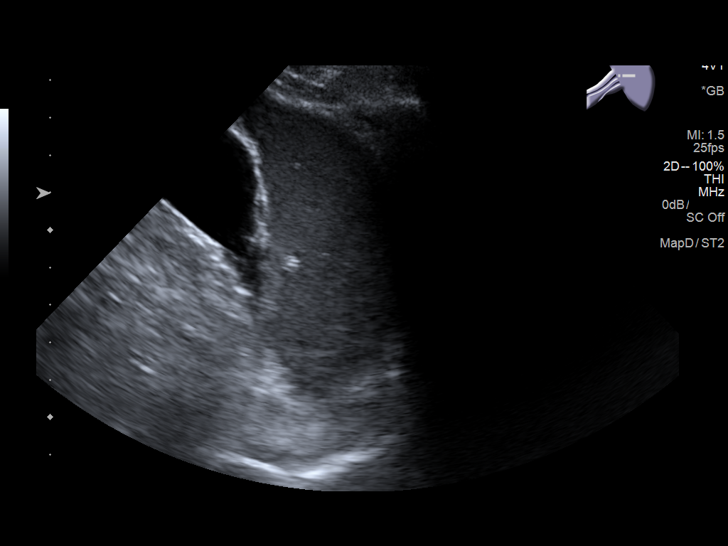
[im 58/78]
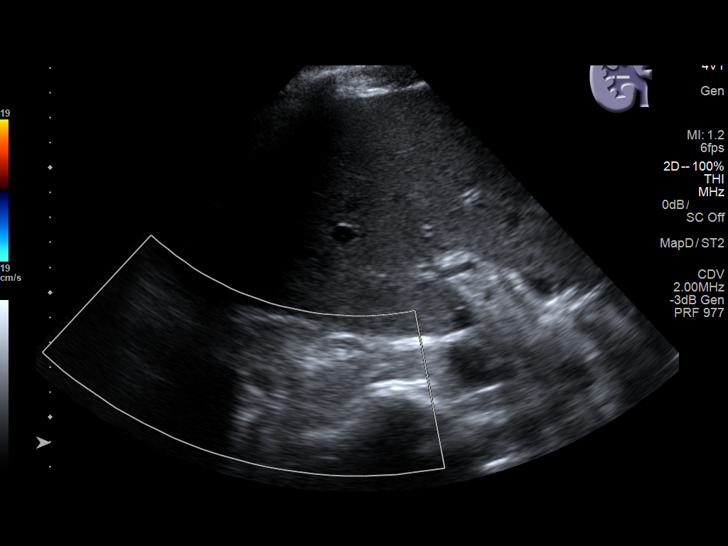
[im 65/78]
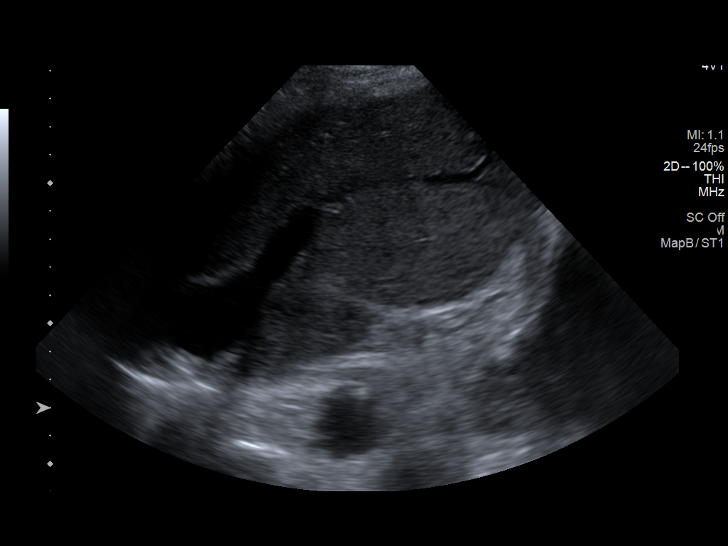
[im 71/78]
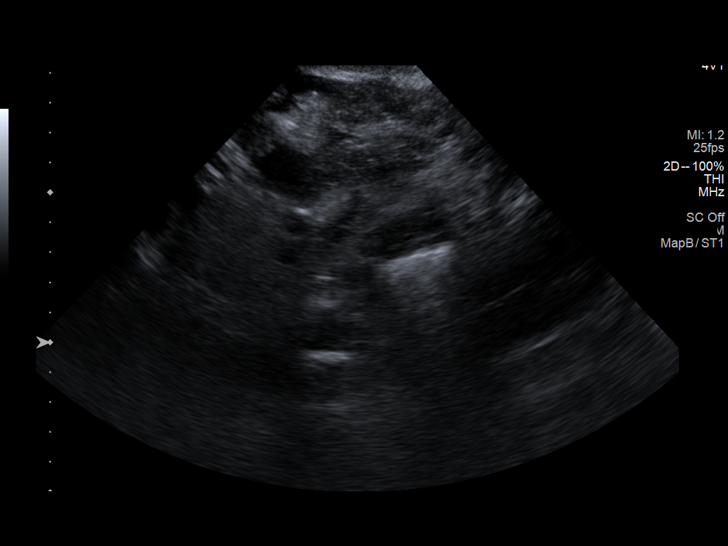
[im 78/78]
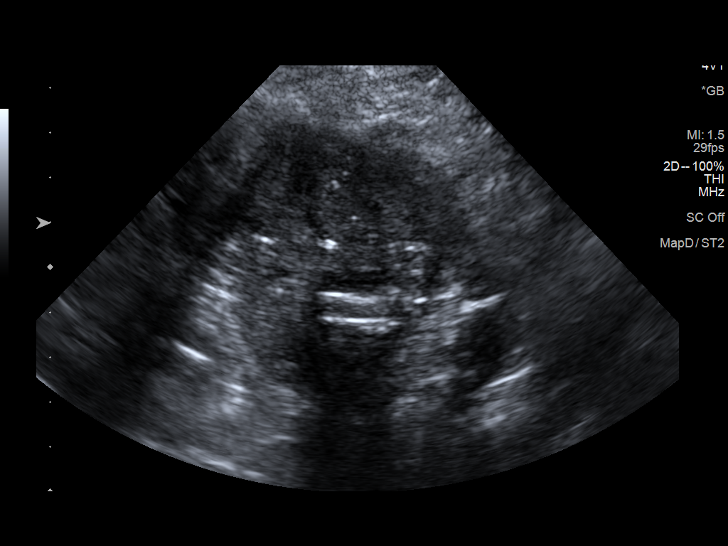

[13 of 25 positions shown; findings below may reference images not displayed]

FINDINGS: Gallbladder: The gallbladder wall thickening with 6 mm wall
thickness and pericholecystic fluid. No gallstones. There was a
positive sonographic Murphy sign reported by the sonographer.

Common bile duct: Diameter: 5 mm

Liver: No focal lesion identified. Within normal limits in
parenchymal echogenicity. Portal vein is patent on color Doppler
imaging with normal direction of blood flow towards the liver.

IVC: No abnormality visualized.

Pancreas: Fluid collection along the anterior pancreatic body
extending into the left upper quadrant corresponding to that seen on
the recent CT and measuring 13+ cm in size.

Spleen: Size and appearance within normal limits.

Right Kidney: Length: 5.0 cm. Severely echogenic and atrophic
without hydronephrosis.

Left Kidney: Length: 5.0 cm. Severely echogenic and atrophic without
hydronephrosis.

Abdominal aorta: No aneurysm visualized.

Other findings: 8.2 cm left lower quadrant renal transplant without
evidence of internal vascularity on color Doppler imaging. Bilateral
pleural effusions. Trace ascites in addition to the
peripancreatic/left upper quadrant fluid collection.
IMPRESSION: 1. Peripancreatic/left upper quadrant fluid collection as seen on
CT, possibly a pseudocyst.
2. Gallbladder wall thickening, pericholecystic fluid, and positive
sonographic Murphy sign. No gallstones. Acalculus cholecystitis is
possible in the appropriate clinical setting.
3. Severely atrophic native kidneys.  Failed renal transplant.

## 2018-04-29 IMAGING — DX DG CHEST 2V
2 series · 2 of 2 positions shown · non-contrast
Comparison: 11/17/2015

CLINICAL DATA: Pt presents with LEFT sided CP and SOB that began
last night; pt reports pain radiates to back; pt also states he is
due for dialysis today

EXAM:
CHEST  2 VIEW

[chest lat]
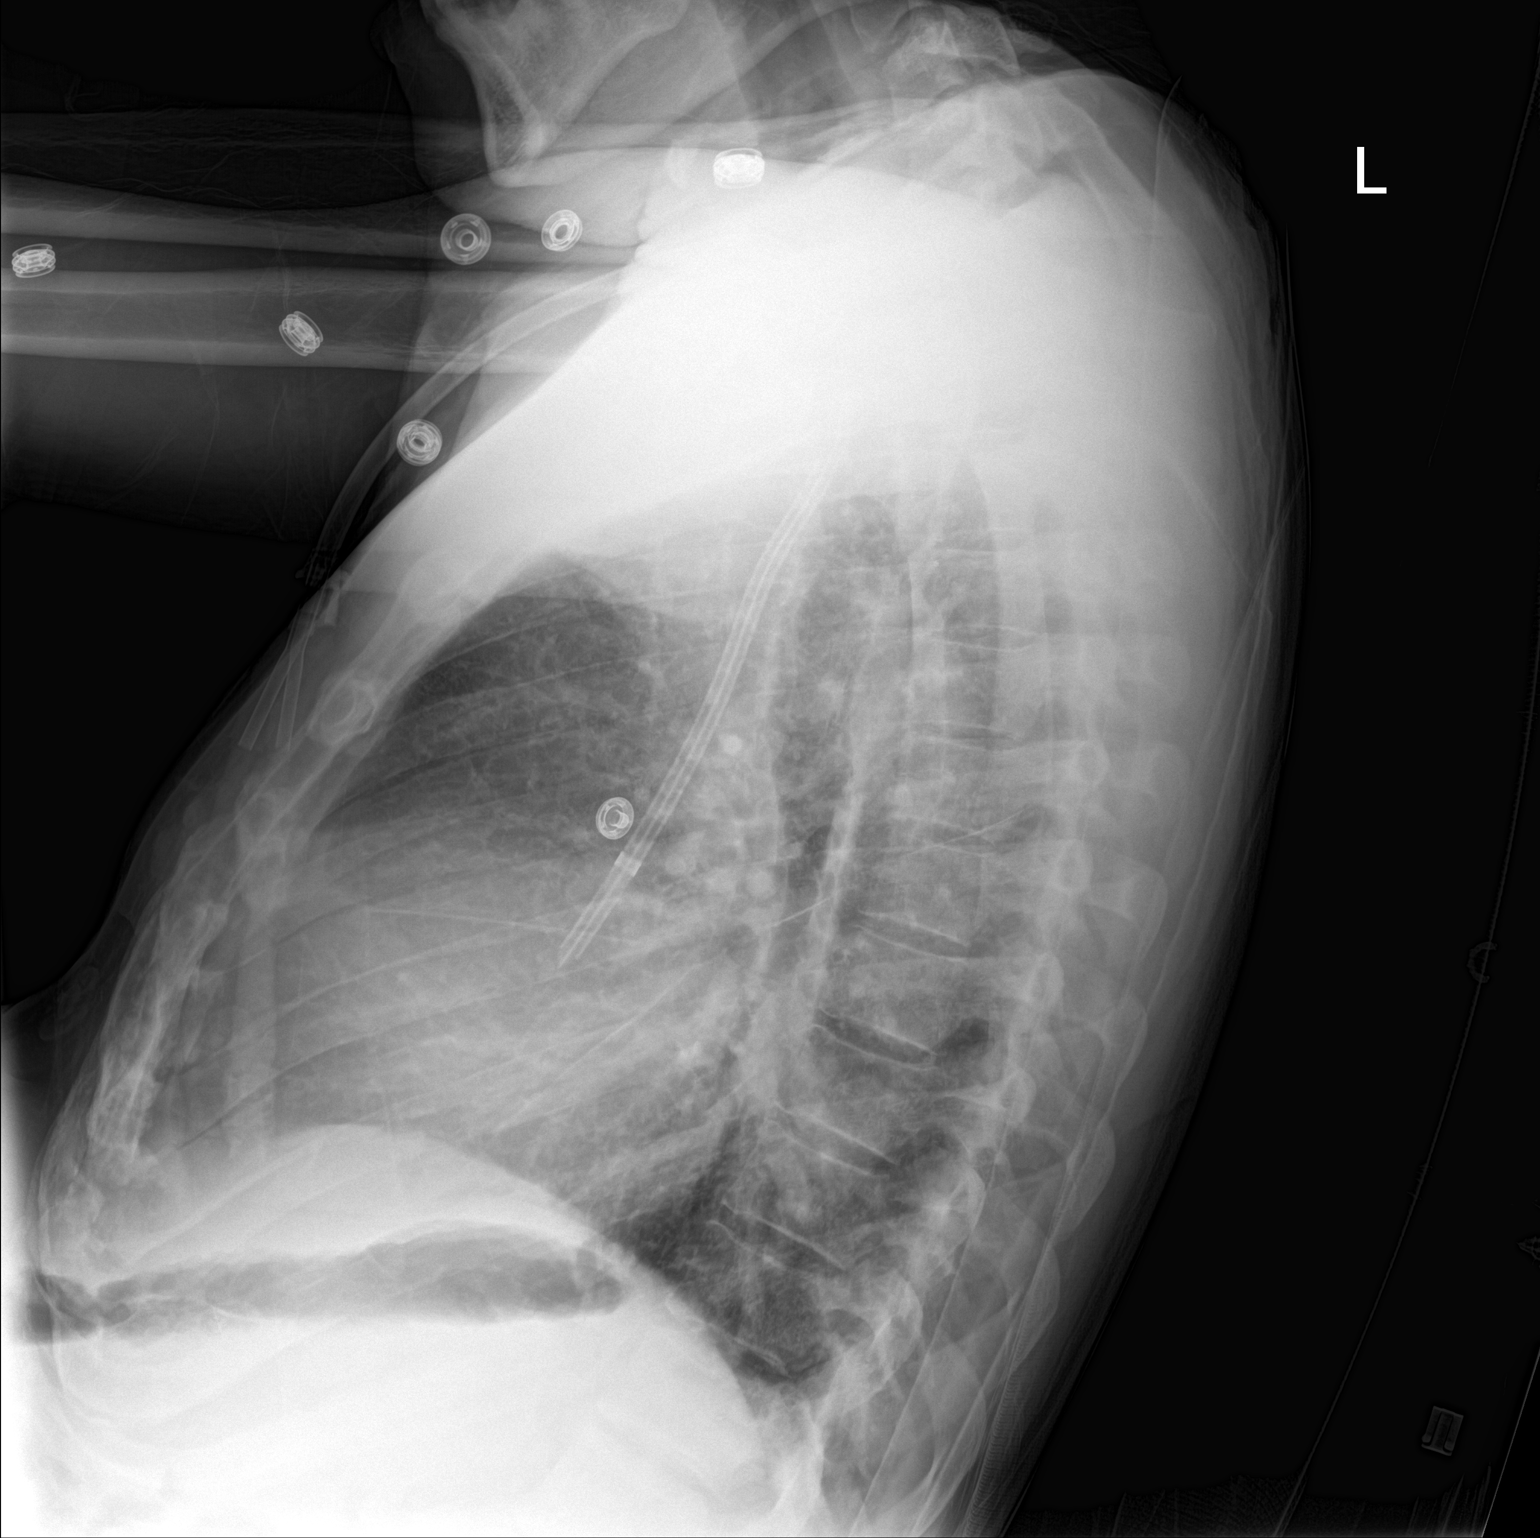

[chest ap]
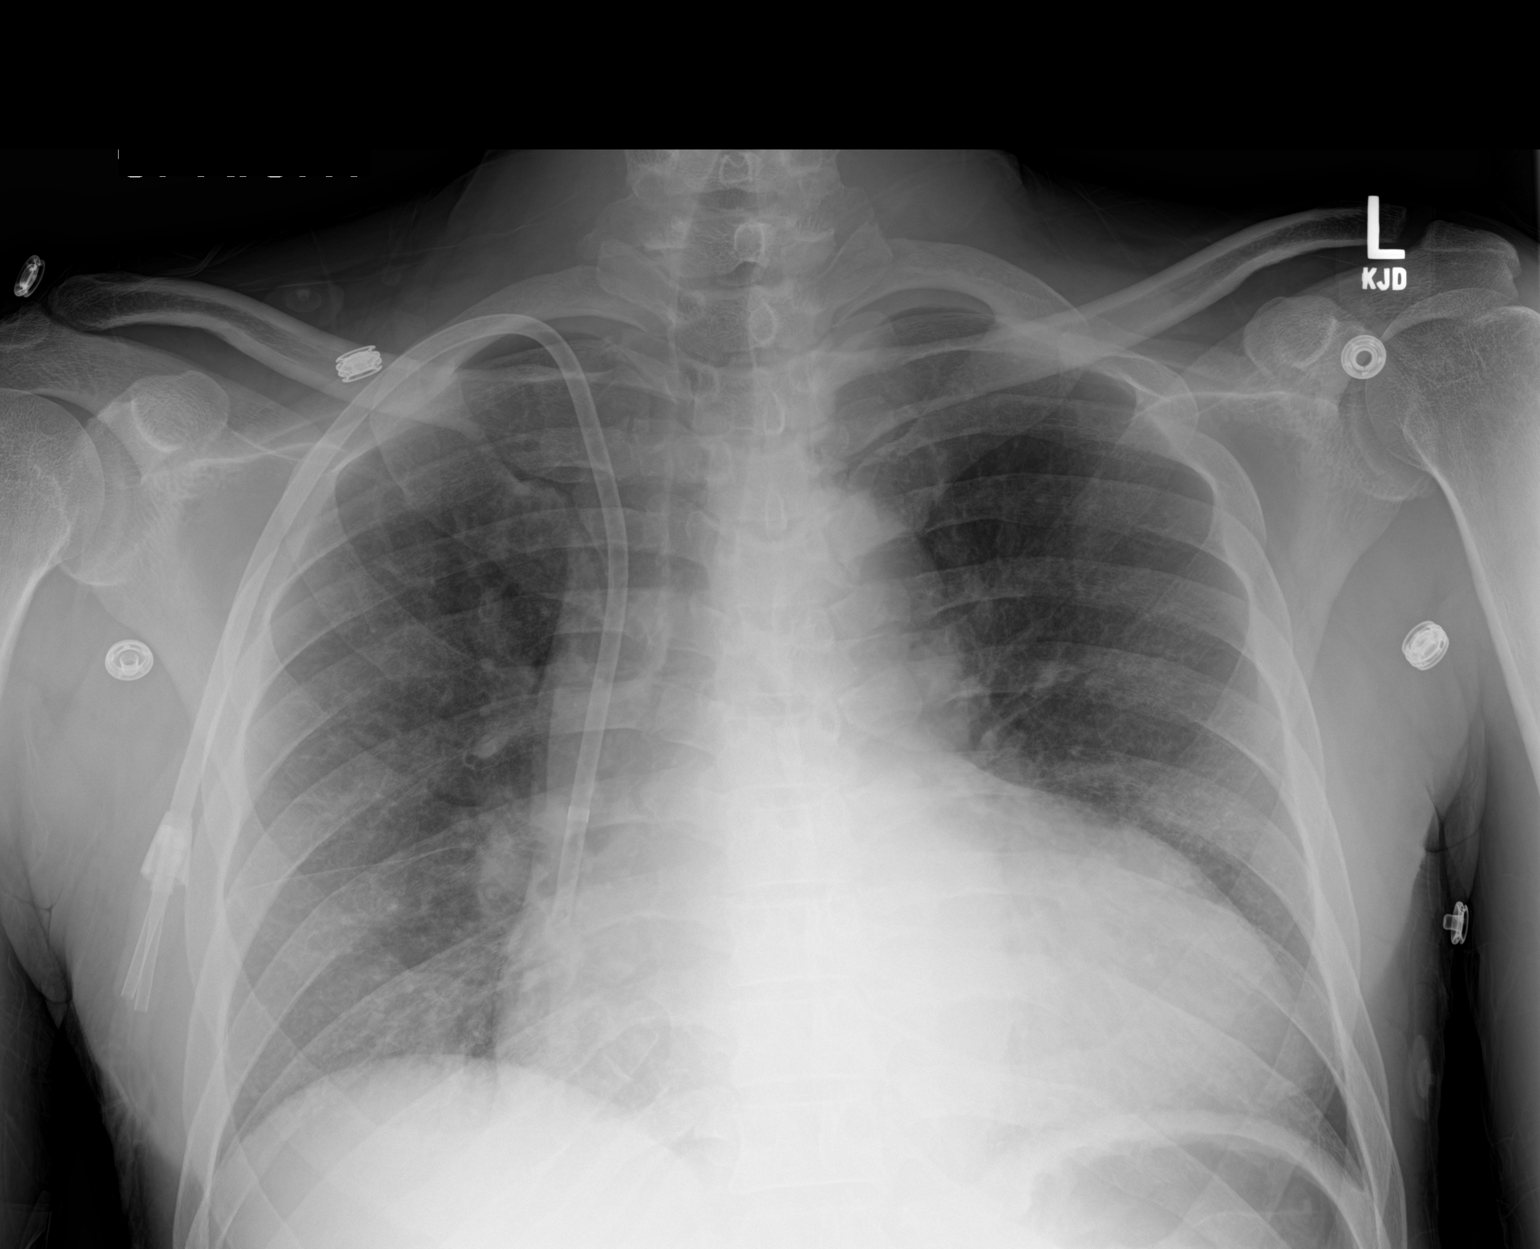

[2 of 2 positions shown; findings below may reference images not displayed]

FINDINGS: Moderate enlargement of the cardiopericardial silhouette. No
mediastinal or hilar masses or evidence of adenopathy.

Lungs are clear.  No pleural effusion or pneumothorax.

Right-sided tunneled central venous catheter tip projects at the
caval atrial junction, stable.

Skeletal structures are unremarkable.
IMPRESSION: 1. No acute cardiopulmonary disease.
2. Moderate cardiomegaly.

## 2018-05-02 ENCOUNTER — Emergency Department (HOSPITAL_COMMUNITY)
Admission: EM | Admit: 2018-05-02 | Discharge: 2018-05-02 | Disposition: A | Discharge status: Home / Self Care | Payer: Medicare Other | Attending: Emergency Medicine | Admitting: Emergency Medicine

## 2018-05-02 ENCOUNTER — Encounter (HOSPITAL_COMMUNITY): Payer: Self-pay | Admitting: Emergency Medicine

## 2018-05-02 DIAGNOSIS — R112 Nausea with vomiting, unspecified: Secondary | ICD-10-CM | POA: Diagnosis not present

## 2018-05-02 DIAGNOSIS — Z79899 Other long term (current) drug therapy: Secondary | ICD-10-CM | POA: Insufficient documentation

## 2018-05-02 DIAGNOSIS — N185 Chronic kidney disease, stage 5: Secondary | ICD-10-CM | POA: Insufficient documentation

## 2018-05-02 DIAGNOSIS — Z87891 Personal history of nicotine dependence: Secondary | ICD-10-CM | POA: Diagnosis not present

## 2018-05-02 DIAGNOSIS — R1084 Generalized abdominal pain: Secondary | ICD-10-CM | POA: Diagnosis not present

## 2018-05-02 DIAGNOSIS — R197 Diarrhea, unspecified: Secondary | ICD-10-CM

## 2018-05-02 DIAGNOSIS — I509 Heart failure, unspecified: Secondary | ICD-10-CM | POA: Diagnosis not present

## 2018-05-02 DIAGNOSIS — I132 Hypertensive heart and chronic kidney disease with heart failure and with stage 5 chronic kidney disease, or end stage renal disease: Secondary | ICD-10-CM | POA: Diagnosis not present

## 2018-05-02 DIAGNOSIS — A09 Infectious gastroenteritis and colitis, unspecified: Secondary | ICD-10-CM | POA: Diagnosis not present

## 2018-05-02 DIAGNOSIS — E1122 Type 2 diabetes mellitus with diabetic chronic kidney disease: Secondary | ICD-10-CM | POA: Insufficient documentation

## 2018-05-02 LAB — COMPREHENSIVE METABOLIC PANEL
ALBUMIN: 2.6 g/dL — AB (ref 3.5–5.0)
ALT: 17 U/L (ref 0–44)
ANION GAP: 14 (ref 5–15)
AST: 27 U/L (ref 15–41)
Alkaline Phosphatase: 117 U/L (ref 38–126)
BUN: 85 mg/dL — ABNORMAL HIGH (ref 6–20)
CHLORIDE: 103 mmol/L (ref 98–111)
CO2: 18 mmol/L — ABNORMAL LOW (ref 22–32)
Calcium: 8.7 mg/dL — ABNORMAL LOW (ref 8.9–10.3)
Creatinine, Ser: 14.57 mg/dL — ABNORMAL HIGH (ref 0.61–1.24)
GFR calc Af Amer: 4 mL/min — ABNORMAL LOW (ref >60)
GFR calc non Af Amer: 3 mL/min — ABNORMAL LOW (ref >60)
GLUCOSE: 105 mg/dL — AB (ref 70–99)
POTASSIUM: 4.6 mmol/L (ref 3.5–5.1)
Sodium: 135 mmol/L (ref 135–145)
TOTAL PROTEIN: 7.1 g/dL (ref 6.5–8.1)
Total Bilirubin: 0.8 mg/dL (ref 0.3–1.2)

## 2018-05-02 LAB — CBC
HEMATOCRIT: 25.8 % — AB (ref 39.0–52.0)
HEMOGLOBIN: 8 g/dL — AB (ref 13.0–17.0)
MCH: 26.7 pg (ref 26.0–34.0)
MCHC: 31 g/dL (ref 30.0–36.0)
MCV: 86 fL (ref 80.0–100.0)
Platelets: 264 10*3/uL (ref 150–400)
RBC: 3 MIL/uL — AB (ref 4.22–5.81)
RDW: 17.6 % — ABNORMAL HIGH (ref 11.5–15.5)
WBC: 5.6 10*3/uL (ref 4.0–10.5)
nRBC: 0 % (ref 0.0–0.2)

## 2018-05-02 LAB — LIPASE, BLOOD: LIPASE: 41 U/L (ref 11–51)

## 2018-05-02 MED ORDER — METRONIDAZOLE 500 MG PO TABS
500.0000 mg | ORAL_TABLET | Freq: Once | ORAL | Status: AC
  Administered 2018-05-02: 500 mg via ORAL
  Filled 2018-05-02: qty 1

## 2018-05-02 MED ORDER — METRONIDAZOLE 500 MG PO TABS: 500.0000 mg | ORAL_TABLET | Freq: Three times a day (TID) | ORAL | 0 refills | Status: DC

## 2018-05-02 MED ORDER — ONDANSETRON HCL 4 MG/2ML IJ SOLN
4.0000 mg | Freq: Once | INTRAMUSCULAR | Status: AC
  Administered 2018-05-02: 4 mg via INTRAVENOUS
  Filled 2018-05-02: qty 2

## 2018-05-02 MED ORDER — HYDROMORPHONE HCL 1 MG/ML IJ SOLN
1.0000 mg | Freq: Once | INTRAMUSCULAR | Status: AC
  Administered 2018-05-02: 1 mg via INTRAVENOUS
  Filled 2018-05-02: qty 1

## 2018-05-02 NOTE — ED Notes (Signed)
Patient wheeled to restroom.
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
## 2018-05-02 NOTE — ED Notes (Signed)
Patient has asked for blood not be collected until [place] arrives.

## 2018-05-02 NOTE — Discharge Instructions (Addendum)
You were evaluated in the emergency department for abdominal pain and frequent diarrhea.  You been seen in the past for these symptoms.  You also had back pain that radiated down into your legs which is also a chronic condition for you.  Your main concern is that you may have had a recurrence of your C. difficile infection.  We are placing you on some antibiotics and have asked you to bring a stool sample to your doctor as you were unable to produce a sample here.  Please follow-up with your doctor for these conditions.  It will also be important for you to resume your dialysis.

## 2018-05-02 NOTE — ED Triage Notes (Signed)
Pt presents to ED for assessment of abdominal pain, diarrhea, and emesis x 2 days.  Last dialysis Tuesday.  C/o lower back pain associated with bowel movements.

## 2018-05-04 DIAGNOSIS — N186 End stage renal disease: Secondary | ICD-10-CM | POA: Diagnosis not present

## 2018-05-04 DIAGNOSIS — D509 Iron deficiency anemia, unspecified: Secondary | ICD-10-CM | POA: Diagnosis not present

## 2018-05-04 DIAGNOSIS — Z992 Dependence on renal dialysis: Secondary | ICD-10-CM | POA: Diagnosis not present

## 2018-05-04 DIAGNOSIS — E1122 Type 2 diabetes mellitus with diabetic chronic kidney disease: Secondary | ICD-10-CM | POA: Diagnosis not present

## 2018-05-04 DIAGNOSIS — D631 Anemia in chronic kidney disease: Secondary | ICD-10-CM | POA: Diagnosis not present

## 2018-05-04 DIAGNOSIS — N2581 Secondary hyperparathyroidism of renal origin: Secondary | ICD-10-CM | POA: Diagnosis not present

## 2018-05-06 DIAGNOSIS — D509 Iron deficiency anemia, unspecified: Secondary | ICD-10-CM | POA: Diagnosis not present

## 2018-05-06 DIAGNOSIS — N2581 Secondary hyperparathyroidism of renal origin: Secondary | ICD-10-CM | POA: Diagnosis not present

## 2018-05-06 DIAGNOSIS — Z992 Dependence on renal dialysis: Secondary | ICD-10-CM | POA: Diagnosis not present

## 2018-05-06 DIAGNOSIS — D631 Anemia in chronic kidney disease: Secondary | ICD-10-CM | POA: Diagnosis not present

## 2018-05-06 DIAGNOSIS — N186 End stage renal disease: Secondary | ICD-10-CM | POA: Diagnosis not present

## 2018-05-07 IMAGING — CR DG ABDOMEN 2V
2 series · 3 of 3 positions shown · non-contrast
Comparison: November 17, 2015.

CLINICAL DATA: Generalized abdominal pain, nausea, vomiting.

EXAM:
ABDOMEN - 2 VIEW

[abdomen erect]
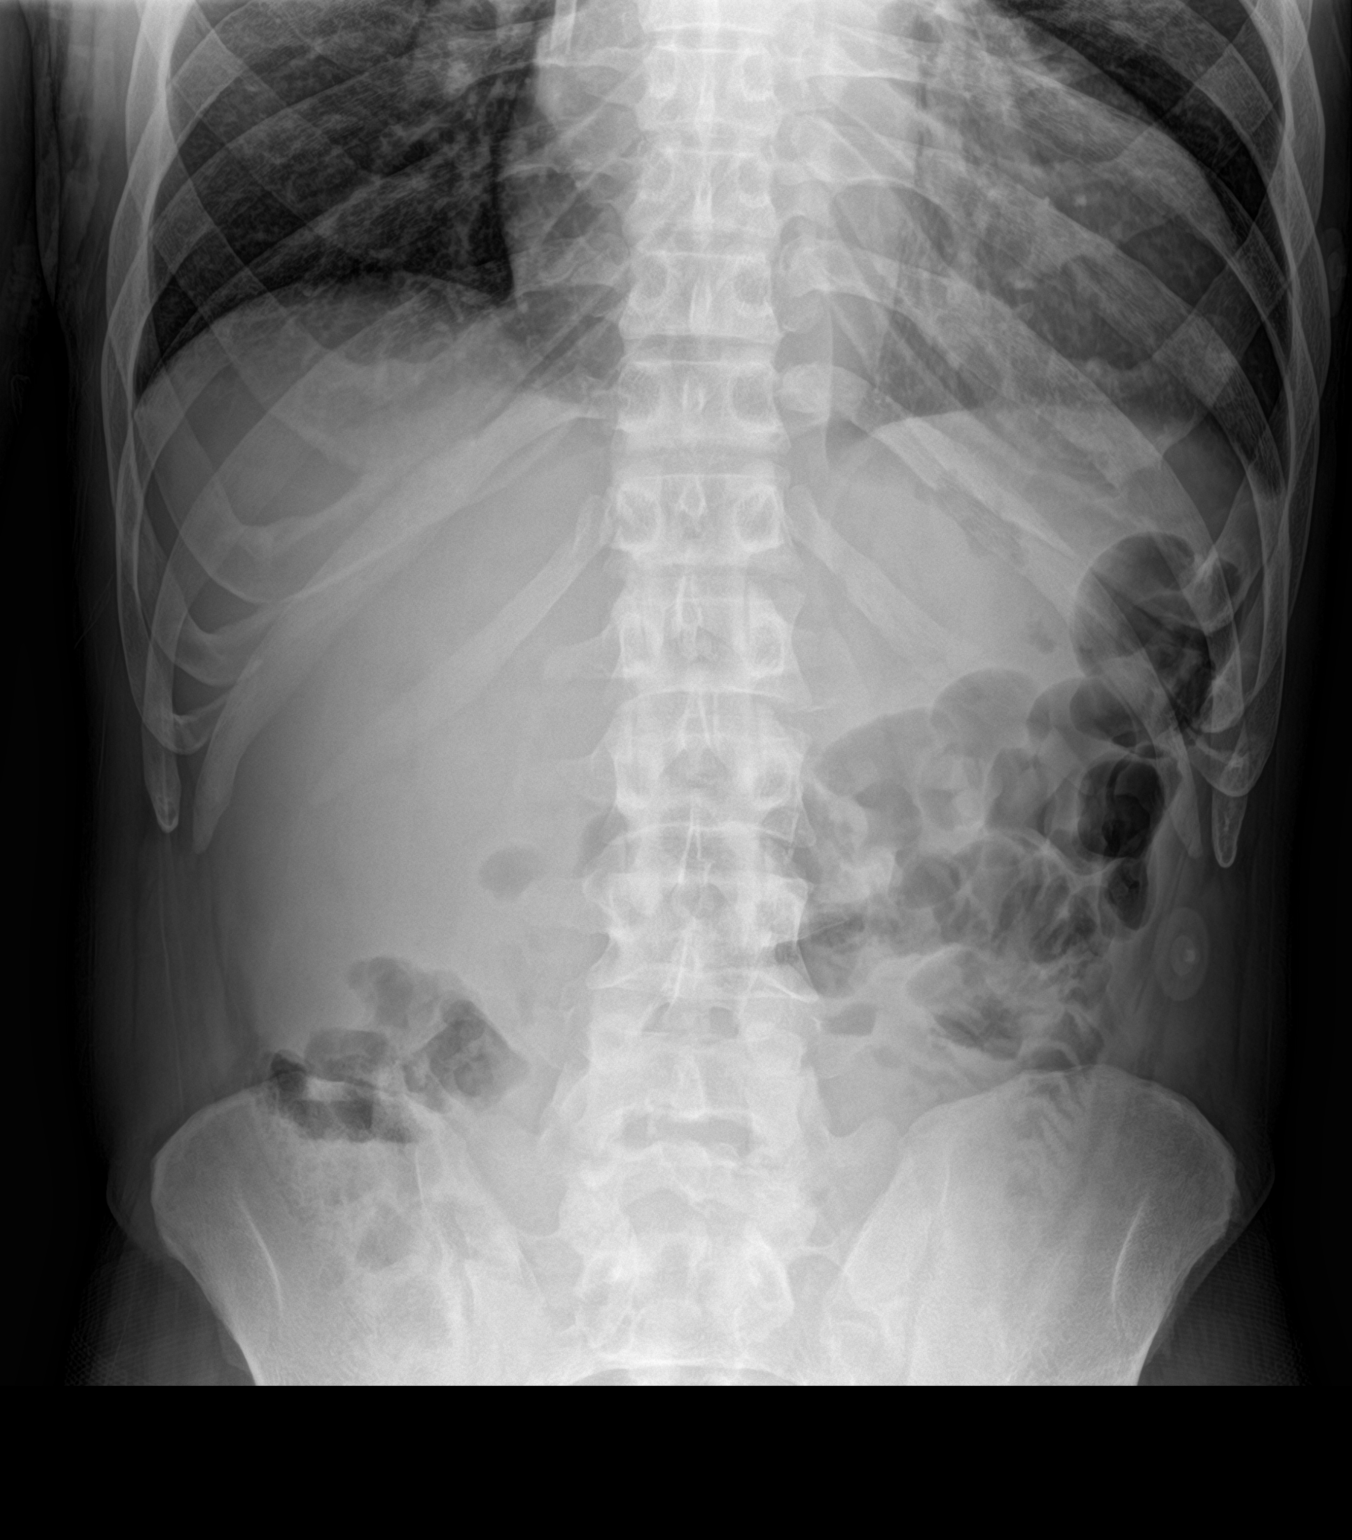

[Series 2: abdomen supine · 0.14mm/px · 2 of 2 slices shown]
[im 1/2]
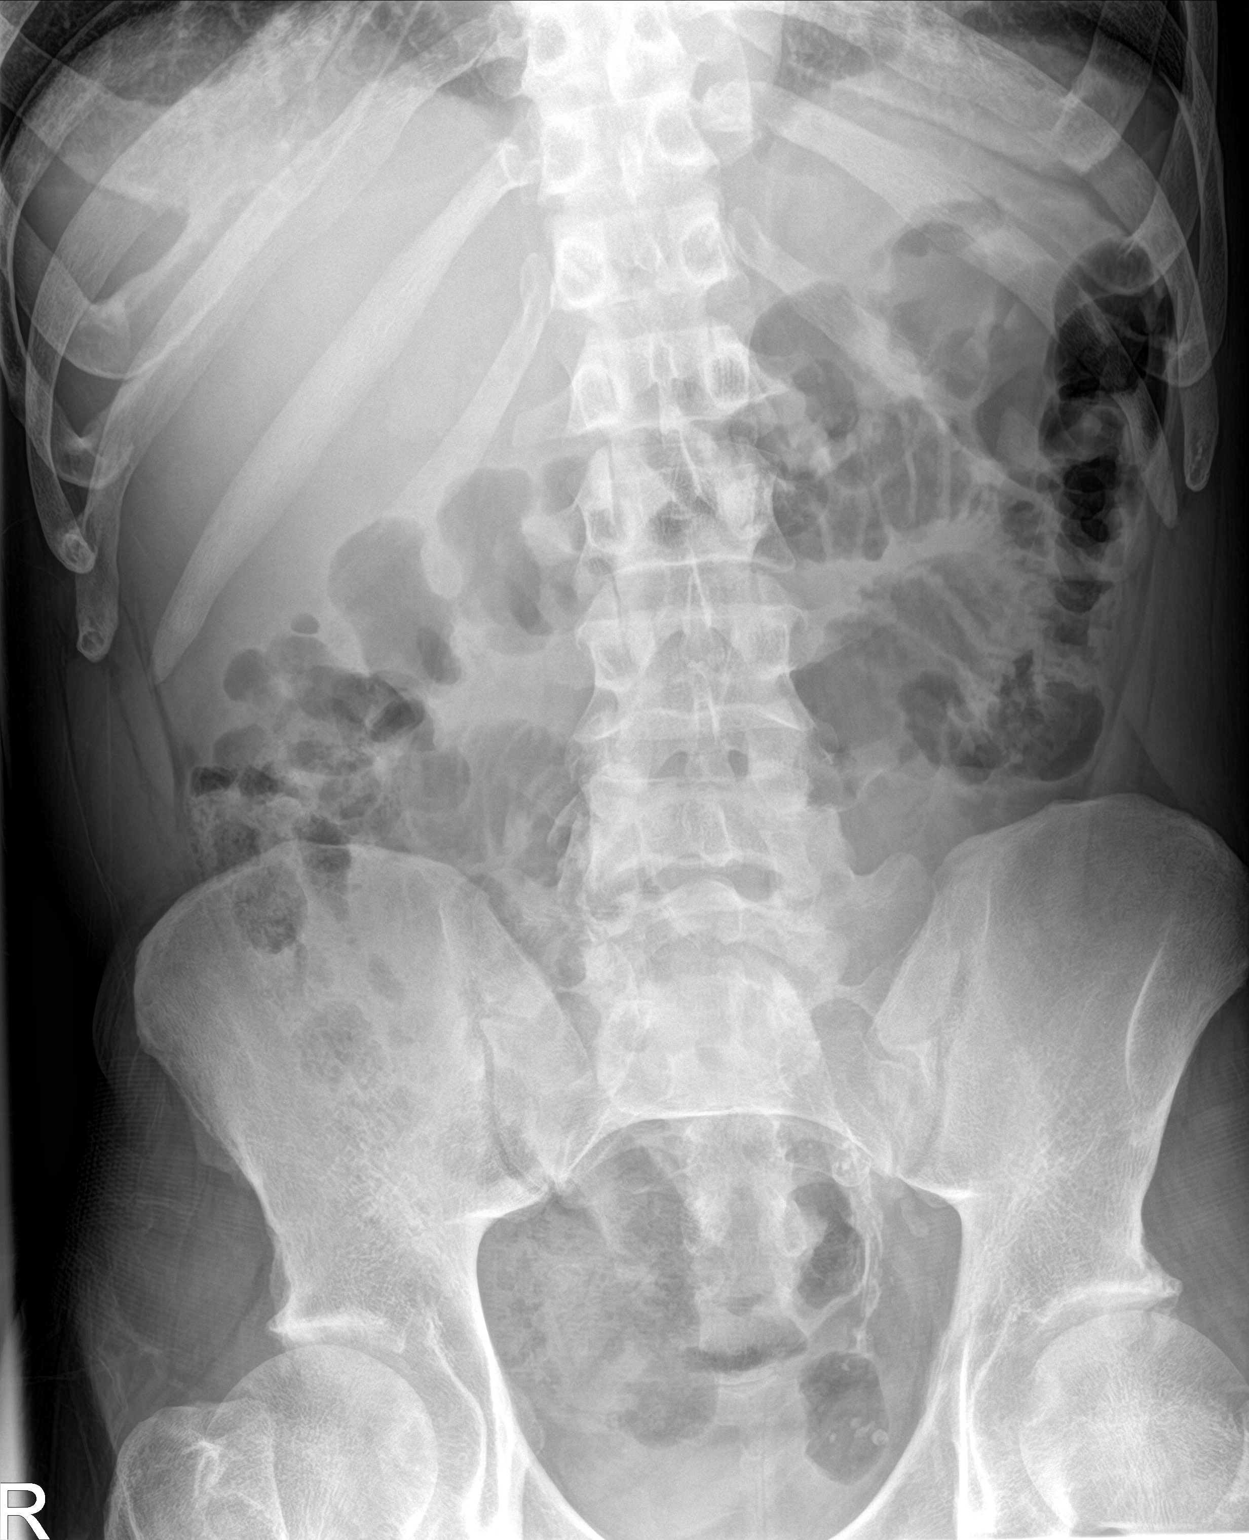
[im 2/2]
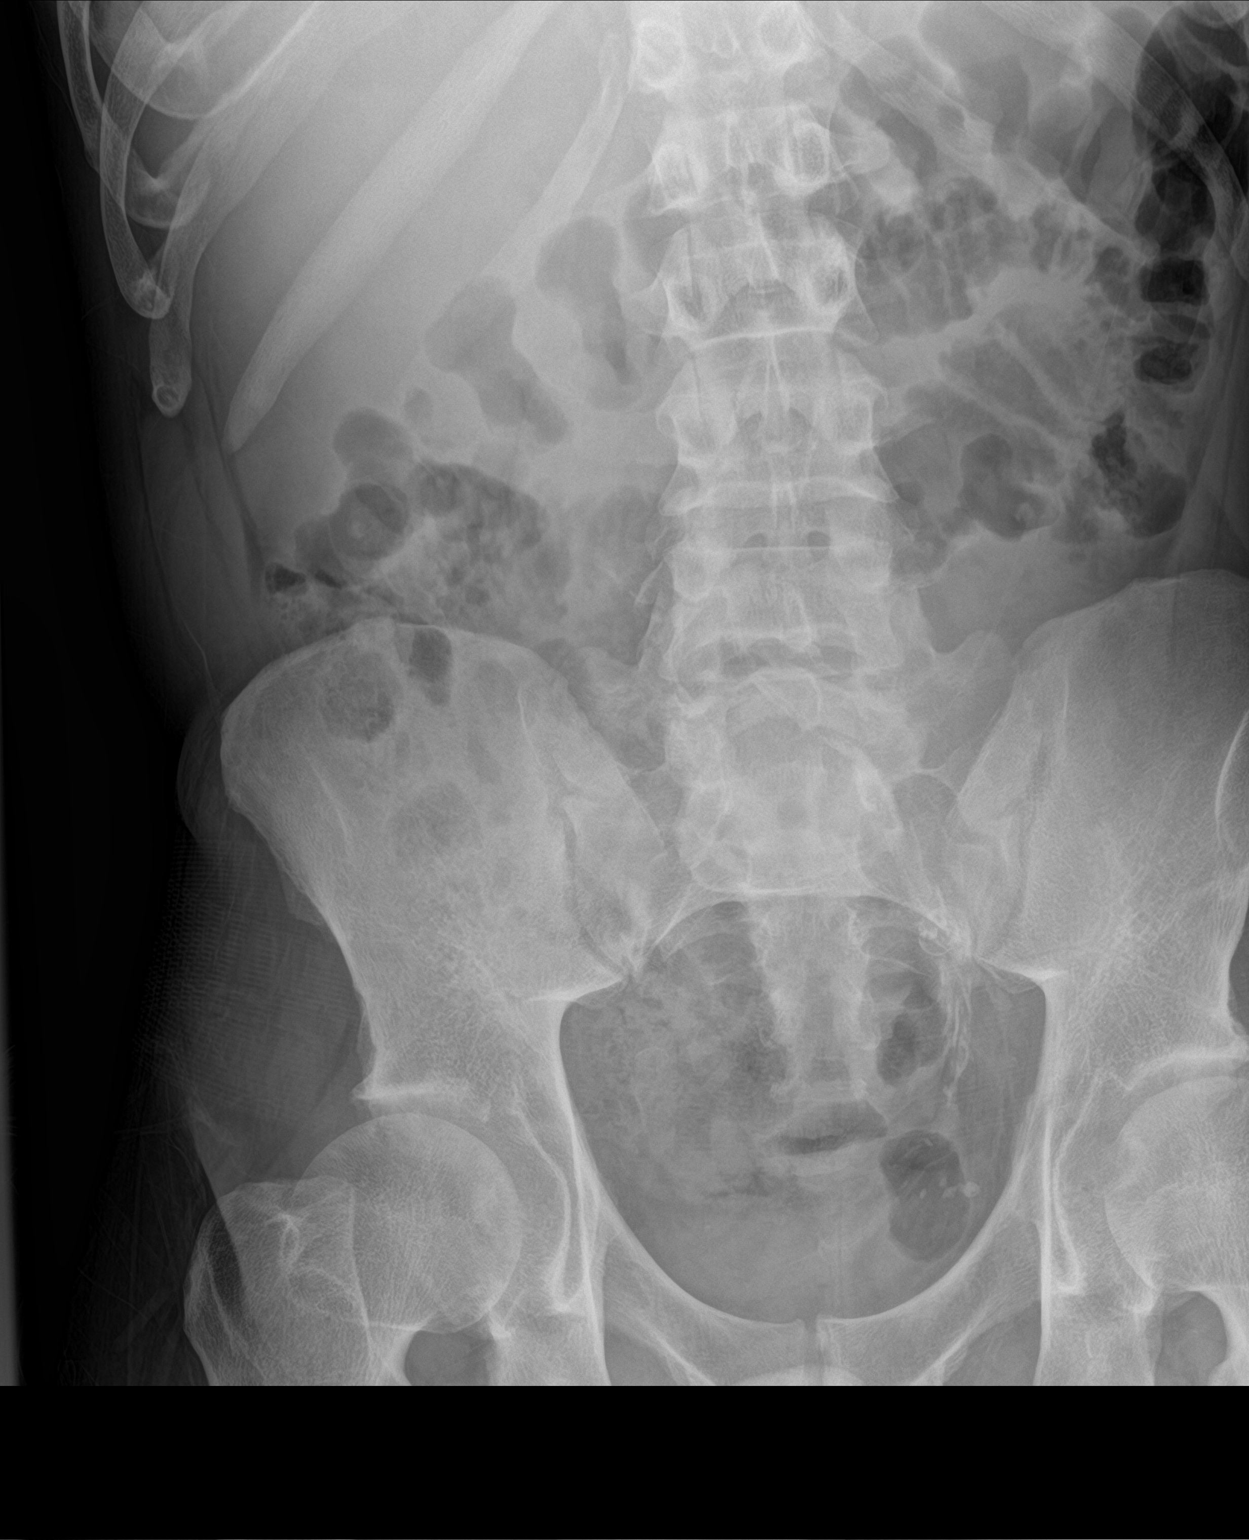

[3 of 3 positions shown; findings below may reference images not displayed]

FINDINGS: The bowel gas pattern is normal. There is no evidence of free air.
Phleboliths are noted in the pelvis.
IMPRESSION: There is no evidence of bowel obstruction or ileus.

## 2018-05-08 ENCOUNTER — Emergency Department (HOSPITAL_COMMUNITY)
Admission: EM | Admit: 2018-05-08 | Discharge: 2018-05-09 | Disposition: A | Discharge status: Home / Self Care | Payer: Medicare Other | Source: Home / Self Care | Attending: Emergency Medicine | Admitting: Emergency Medicine

## 2018-05-08 ENCOUNTER — Other Ambulatory Visit: Payer: Self-pay

## 2018-05-08 ENCOUNTER — Encounter (HOSPITAL_COMMUNITY): Payer: Self-pay | Admitting: Emergency Medicine

## 2018-05-08 DIAGNOSIS — N186 End stage renal disease: Secondary | ICD-10-CM | POA: Insufficient documentation

## 2018-05-08 DIAGNOSIS — Z8249 Family history of ischemic heart disease and other diseases of the circulatory system: Secondary | ICD-10-CM | POA: Diagnosis not present

## 2018-05-08 DIAGNOSIS — I2699 Other pulmonary embolism without acute cor pulmonale: Secondary | ICD-10-CM | POA: Diagnosis not present

## 2018-05-08 DIAGNOSIS — Z87891 Personal history of nicotine dependence: Secondary | ICD-10-CM | POA: Diagnosis not present

## 2018-05-08 DIAGNOSIS — E46 Unspecified protein-calorie malnutrition: Secondary | ICD-10-CM | POA: Diagnosis not present

## 2018-05-08 DIAGNOSIS — N2581 Secondary hyperparathyroidism of renal origin: Secondary | ICD-10-CM | POA: Diagnosis not present

## 2018-05-08 DIAGNOSIS — F1120 Opioid dependence, uncomplicated: Secondary | ICD-10-CM | POA: Diagnosis not present

## 2018-05-08 DIAGNOSIS — I5042 Chronic combined systolic (congestive) and diastolic (congestive) heart failure: Secondary | ICD-10-CM

## 2018-05-08 DIAGNOSIS — K219 Gastro-esophageal reflux disease without esophagitis: Secondary | ICD-10-CM | POA: Diagnosis not present

## 2018-05-08 DIAGNOSIS — Z992 Dependence on renal dialysis: Secondary | ICD-10-CM

## 2018-05-08 DIAGNOSIS — I132 Hypertensive heart and chronic kidney disease with heart failure and with stage 5 chronic kidney disease, or end stage renal disease: Secondary | ICD-10-CM | POA: Insufficient documentation

## 2018-05-08 DIAGNOSIS — K861 Other chronic pancreatitis: Secondary | ICD-10-CM | POA: Diagnosis not present

## 2018-05-08 DIAGNOSIS — F4323 Adjustment disorder with mixed anxiety and depressed mood: Secondary | ICD-10-CM | POA: Diagnosis not present

## 2018-05-08 DIAGNOSIS — Z7901 Long term (current) use of anticoagulants: Secondary | ICD-10-CM | POA: Insufficient documentation

## 2018-05-08 DIAGNOSIS — Z79899 Other long term (current) drug therapy: Secondary | ICD-10-CM | POA: Insufficient documentation

## 2018-05-08 DIAGNOSIS — Z86718 Personal history of other venous thrombosis and embolism: Secondary | ICD-10-CM

## 2018-05-08 DIAGNOSIS — E11649 Type 2 diabetes mellitus with hypoglycemia without coma: Secondary | ICD-10-CM | POA: Diagnosis not present

## 2018-05-08 DIAGNOSIS — Z888 Allergy status to other drugs, medicaments and biological substances status: Secondary | ICD-10-CM | POA: Diagnosis not present

## 2018-05-08 DIAGNOSIS — I120 Hypertensive chronic kidney disease with stage 5 chronic kidney disease or end stage renal disease: Secondary | ICD-10-CM | POA: Diagnosis not present

## 2018-05-08 DIAGNOSIS — R112 Nausea with vomiting, unspecified: Secondary | ICD-10-CM | POA: Diagnosis not present

## 2018-05-08 DIAGNOSIS — Z9981 Dependence on supplemental oxygen: Secondary | ICD-10-CM | POA: Diagnosis not present

## 2018-05-08 DIAGNOSIS — I428 Other cardiomyopathies: Secondary | ICD-10-CM | POA: Insufficient documentation

## 2018-05-08 DIAGNOSIS — R1084 Generalized abdominal pain: Secondary | ICD-10-CM

## 2018-05-08 DIAGNOSIS — E162 Hypoglycemia, unspecified: Secondary | ICD-10-CM | POA: Diagnosis present

## 2018-05-08 DIAGNOSIS — F418 Other specified anxiety disorders: Secondary | ICD-10-CM | POA: Diagnosis not present

## 2018-05-08 DIAGNOSIS — G894 Chronic pain syndrome: Secondary | ICD-10-CM | POA: Diagnosis not present

## 2018-05-08 DIAGNOSIS — D631 Anemia in chronic kidney disease: Secondary | ICD-10-CM | POA: Diagnosis not present

## 2018-05-08 DIAGNOSIS — R197 Diarrhea, unspecified: Secondary | ICD-10-CM | POA: Diagnosis not present

## 2018-05-08 DIAGNOSIS — Z885 Allergy status to narcotic agent status: Secondary | ICD-10-CM | POA: Diagnosis not present

## 2018-05-08 LAB — COMPREHENSIVE METABOLIC PANEL
ALBUMIN: 2.6 g/dL — AB (ref 3.5–5.0)
ALT: 12 U/L (ref 0–44)
ANION GAP: 13 (ref 5–15)
AST: 19 U/L (ref 15–41)
Alkaline Phosphatase: 126 U/L (ref 38–126)
BILIRUBIN TOTAL: 0.6 mg/dL (ref 0.3–1.2)
BUN: 49 mg/dL — AB (ref 6–20)
CO2: 25 mmol/L (ref 22–32)
Calcium: 9.1 mg/dL (ref 8.9–10.3)
Chloride: 99 mmol/L (ref 98–111)
Creatinine, Ser: 10.51 mg/dL — ABNORMAL HIGH (ref 0.61–1.24)
GFR calc Af Amer: 6 mL/min — ABNORMAL LOW (ref >60)
GFR, EST NON AFRICAN AMERICAN: 5 mL/min — AB (ref >60)
GLUCOSE: 81 mg/dL (ref 70–99)
POTASSIUM: 4.4 mmol/L (ref 3.5–5.1)
Sodium: 137 mmol/L (ref 135–145)
TOTAL PROTEIN: 7.4 g/dL (ref 6.5–8.1)

## 2018-05-08 LAB — CBC
HCT: 25.8 % — ABNORMAL LOW (ref 39.0–52.0)
HEMOGLOBIN: 8 g/dL — AB (ref 13.0–17.0)
MCH: 27.3 pg (ref 26.0–34.0)
MCHC: 31 g/dL (ref 30.0–36.0)
MCV: 88.1 fL (ref 80.0–100.0)
Platelets: 231 10*3/uL (ref 150–400)
RBC: 2.93 MIL/uL — AB (ref 4.22–5.81)
RDW: 18.5 % — ABNORMAL HIGH (ref 11.5–15.5)
WBC: 6.2 10*3/uL (ref 4.0–10.5)
nRBC: 0 % (ref 0.0–0.2)

## 2018-05-08 LAB — LIPASE, BLOOD: LIPASE: 93 U/L — AB (ref 11–51)

## 2018-05-08 NOTE — ED Notes (Signed)
This NT asked pt if pt could move near the middle of the stretcher so this NT could put up railing for pt safety. Pt denied putting railing up.

## 2018-05-08 NOTE — ED Triage Notes (Signed)
Pt reports mid abd pain that radiates to his lower back pain and N/V/D that started two days ago. Pt unable to take his medications d/t vomiting. Pt has attempted OTD Zofran at home without relief. Pt was unable to do dialysis today d/t BP and vomiting. Needs to go back tomorrow. Hx of htn, has been unable to keep it down.

## 2018-05-08 NOTE — ED Notes (Signed)
Results reviewed.  No changes in acuity at this time

## 2018-05-09 ENCOUNTER — Observation Stay (HOSPITAL_COMMUNITY): Payer: Medicare Other

## 2018-05-09 ENCOUNTER — Inpatient Hospital Stay (HOSPITAL_COMMUNITY)
Admission: EM | Admit: 2018-05-09 | Discharge: 2018-05-15 | DRG: 438 | Disposition: A | Discharge status: Skilled Nursing Facility | Payer: Medicare Other | Attending: Family Medicine | Admitting: Family Medicine

## 2018-05-09 DIAGNOSIS — G894 Chronic pain syndrome: Secondary | ICD-10-CM | POA: Diagnosis present

## 2018-05-09 DIAGNOSIS — R109 Unspecified abdominal pain: Secondary | ICD-10-CM | POA: Diagnosis present

## 2018-05-09 DIAGNOSIS — I5023 Acute on chronic systolic (congestive) heart failure: Secondary | ICD-10-CM | POA: Diagnosis present

## 2018-05-09 DIAGNOSIS — N186 End stage renal disease: Secondary | ICD-10-CM | POA: Diagnosis not present

## 2018-05-09 DIAGNOSIS — Z885 Allergy status to narcotic agent status: Secondary | ICD-10-CM

## 2018-05-09 DIAGNOSIS — Z9981 Dependence on supplemental oxygen: Secondary | ICD-10-CM

## 2018-05-09 DIAGNOSIS — I5042 Chronic combined systolic (congestive) and diastolic (congestive) heart failure: Secondary | ICD-10-CM | POA: Diagnosis not present

## 2018-05-09 DIAGNOSIS — E11649 Type 2 diabetes mellitus with hypoglycemia without coma: Secondary | ICD-10-CM | POA: Diagnosis not present

## 2018-05-09 DIAGNOSIS — R112 Nausea with vomiting, unspecified: Secondary | ICD-10-CM | POA: Diagnosis present

## 2018-05-09 DIAGNOSIS — R1084 Generalized abdominal pain: Secondary | ICD-10-CM

## 2018-05-09 DIAGNOSIS — N189 Chronic kidney disease, unspecified: Secondary | ICD-10-CM | POA: Diagnosis present

## 2018-05-09 DIAGNOSIS — I132 Hypertensive heart and chronic kidney disease with heart failure and with stage 5 chronic kidney disease, or end stage renal disease: Secondary | ICD-10-CM | POA: Diagnosis present

## 2018-05-09 DIAGNOSIS — Z992 Dependence on renal dialysis: Secondary | ICD-10-CM

## 2018-05-09 DIAGNOSIS — D631 Anemia in chronic kidney disease: Secondary | ICD-10-CM | POA: Diagnosis not present

## 2018-05-09 DIAGNOSIS — Z86718 Personal history of other venous thrombosis and embolism: Secondary | ICD-10-CM | POA: Diagnosis present

## 2018-05-09 DIAGNOSIS — I2699 Other pulmonary embolism without acute cor pulmonale: Secondary | ICD-10-CM | POA: Diagnosis not present

## 2018-05-09 DIAGNOSIS — K861 Other chronic pancreatitis: Secondary | ICD-10-CM

## 2018-05-09 DIAGNOSIS — E1159 Type 2 diabetes mellitus with other circulatory complications: Secondary | ICD-10-CM | POA: Diagnosis present

## 2018-05-09 DIAGNOSIS — F4323 Adjustment disorder with mixed anxiety and depressed mood: Secondary | ICD-10-CM | POA: Diagnosis present

## 2018-05-09 DIAGNOSIS — I5022 Chronic systolic (congestive) heart failure: Secondary | ICD-10-CM | POA: Diagnosis present

## 2018-05-09 DIAGNOSIS — R197 Diarrhea, unspecified: Secondary | ICD-10-CM | POA: Diagnosis present

## 2018-05-09 DIAGNOSIS — I10 Essential (primary) hypertension: Secondary | ICD-10-CM | POA: Diagnosis not present

## 2018-05-09 DIAGNOSIS — F1120 Opioid dependence, uncomplicated: Secondary | ICD-10-CM | POA: Diagnosis present

## 2018-05-09 DIAGNOSIS — Z87891 Personal history of nicotine dependence: Secondary | ICD-10-CM

## 2018-05-09 DIAGNOSIS — Z888 Allergy status to other drugs, medicaments and biological substances status: Secondary | ICD-10-CM

## 2018-05-09 DIAGNOSIS — K219 Gastro-esophageal reflux disease without esophagitis: Secondary | ICD-10-CM | POA: Diagnosis present

## 2018-05-09 DIAGNOSIS — N2889 Other specified disorders of kidney and ureter: Secondary | ICD-10-CM | POA: Diagnosis not present

## 2018-05-09 DIAGNOSIS — Z79899 Other long term (current) drug therapy: Secondary | ICD-10-CM

## 2018-05-09 DIAGNOSIS — R262 Difficulty in walking, not elsewhere classified: Secondary | ICD-10-CM | POA: Diagnosis present

## 2018-05-09 DIAGNOSIS — F418 Other specified anxiety disorders: Secondary | ICD-10-CM | POA: Diagnosis present

## 2018-05-09 DIAGNOSIS — Z7901 Long term (current) use of anticoagulants: Secondary | ICD-10-CM

## 2018-05-09 DIAGNOSIS — Z8249 Family history of ischemic heart disease and other diseases of the circulatory system: Secondary | ICD-10-CM

## 2018-05-09 DIAGNOSIS — E162 Hypoglycemia, unspecified: Secondary | ICD-10-CM | POA: Diagnosis present

## 2018-05-09 DIAGNOSIS — N2581 Secondary hyperparathyroidism of renal origin: Secondary | ICD-10-CM | POA: Diagnosis not present

## 2018-05-09 DIAGNOSIS — Z86711 Personal history of pulmonary embolism: Secondary | ICD-10-CM

## 2018-05-09 DIAGNOSIS — Z9119 Patient's noncompliance with other medical treatment and regimen: Secondary | ICD-10-CM

## 2018-05-09 DIAGNOSIS — Z9115 Patient's noncompliance with renal dialysis: Secondary | ICD-10-CM

## 2018-05-09 DIAGNOSIS — E46 Unspecified protein-calorie malnutrition: Secondary | ICD-10-CM | POA: Diagnosis present

## 2018-05-09 HISTORY — DX: Other chronic pancreatitis: K86.1

## 2018-05-09 HISTORY — DX: Hypoglycemia, unspecified: E16.2

## 2018-05-09 LAB — COMPREHENSIVE METABOLIC PANEL
ALT: 12 U/L (ref 0–44)
AST: 30 U/L (ref 15–41)
Albumin: 2.9 g/dL — ABNORMAL LOW (ref 3.5–5.0)
Alkaline Phosphatase: 119 U/L (ref 38–126)
Anion gap: 16 — ABNORMAL HIGH (ref 5–15)
BUN: 56 mg/dL — ABNORMAL HIGH (ref 6–20)
CHLORIDE: 100 mmol/L (ref 98–111)
CO2: 21 mmol/L — AB (ref 22–32)
CREATININE: 11.99 mg/dL — AB (ref 0.61–1.24)
Calcium: 9.1 mg/dL (ref 8.9–10.3)
GFR calc non Af Amer: 4 mL/min — ABNORMAL LOW (ref >60)
GFR, EST AFRICAN AMERICAN: 5 mL/min — AB (ref >60)
Glucose, Bld: 65 mg/dL — ABNORMAL LOW (ref 70–99)
Potassium: 5 mmol/L (ref 3.5–5.1)
SODIUM: 137 mmol/L (ref 135–145)
Total Bilirubin: 0.9 mg/dL (ref 0.3–1.2)
Total Protein: 7.8 g/dL (ref 6.5–8.1)

## 2018-05-09 LAB — CBC
HEMATOCRIT: 31.2 % — AB (ref 39.0–52.0)
HEMOGLOBIN: 9 g/dL — AB (ref 13.0–17.0)
MCH: 26.5 pg (ref 26.0–34.0)
MCHC: 28.8 g/dL — ABNORMAL LOW (ref 30.0–36.0)
MCV: 92 fL (ref 80.0–100.0)
NRBC: 0 % (ref 0.0–0.2)
Platelets: 208 10*3/uL (ref 150–400)
RBC: 3.39 MIL/uL — ABNORMAL LOW (ref 4.22–5.81)
RDW: 18.6 % — AB (ref 11.5–15.5)
WBC: 5.5 10*3/uL (ref 4.0–10.5)

## 2018-05-09 LAB — CBG MONITORING, ED
GLUCOSE-CAPILLARY: 129 mg/dL — AB (ref 70–99)
GLUCOSE-CAPILLARY: 44 mg/dL — AB (ref 70–99)
Glucose-Capillary: 53 mg/dL — ABNORMAL LOW (ref 70–99)
Glucose-Capillary: 44 mg/dL — CL (ref 70–99)
Glucose-Capillary: 56 mg/dL — ABNORMAL LOW (ref 70–99)

## 2018-05-09 LAB — LIPASE, BLOOD: LIPASE: 48 U/L (ref 11–51)

## 2018-05-09 LAB — GLUCOSE, CAPILLARY
Glucose-Capillary: 57 mg/dL — ABNORMAL LOW (ref 70–99)
Glucose-Capillary: 85 mg/dL (ref 70–99)

## 2018-05-09 MED ORDER — GABAPENTIN 300 MG PO CAPS
300.0000 mg | ORAL_CAPSULE | Freq: Every evening | ORAL | Status: DC
  Administered 2018-05-09 – 2018-05-13 (×5): 300 mg via ORAL
  Filled 2018-05-09 (×6): qty 1

## 2018-05-09 MED ORDER — BUSPIRONE HCL 5 MG PO TABS
5.0000 mg | ORAL_TABLET | Freq: Two times a day (BID) | ORAL | Status: DC
  Administered 2018-05-09 – 2018-05-15 (×8): 5 mg via ORAL
  Filled 2018-05-09 (×4): qty 1
  Filled 2018-05-09: qty 0.5
  Filled 2018-05-09: qty 1
  Filled 2018-05-09: qty 0.5
  Filled 2018-05-09 (×2): qty 1
  Filled 2018-05-09: qty 0.5
  Filled 2018-05-09 (×5): qty 1
  Filled 2018-05-09: qty 0.5

## 2018-05-09 MED ORDER — RENA-VITE PO TABS
1.0000 | ORAL_TABLET | Freq: Every day | ORAL | Status: DC
  Administered 2018-05-09 – 2018-05-11 (×3): 1 via ORAL
  Filled 2018-05-09 (×5): qty 1

## 2018-05-09 MED ORDER — CINACALCET HCL 30 MG PO TABS
90.0000 mg | ORAL_TABLET | Freq: Every evening | ORAL | Status: DC
  Administered 2018-05-11 – 2018-05-13 (×3): 90 mg via ORAL
  Filled 2018-05-09 (×5): qty 3

## 2018-05-09 MED ORDER — OXYCODONE HCL 5 MG PO TABS
5.0000 mg | ORAL_TABLET | ORAL | Status: DC | PRN
  Administered 2018-05-10: 5 mg via ORAL
  Filled 2018-05-09: qty 1

## 2018-05-09 MED ORDER — ETOMIDATE 2 MG/ML IV SOLN: INTRAVENOUS | Status: AC | PRN

## 2018-05-09 MED ORDER — ONDANSETRON HCL 4 MG/2ML IJ SOLN
4.0000 mg | Freq: Once | INTRAMUSCULAR | Status: AC
  Administered 2018-05-09: 4 mg via INTRAVENOUS
  Filled 2018-05-09: qty 2

## 2018-05-09 MED ORDER — HYDROMORPHONE HCL 1 MG/ML IJ SOLN
1.0000 mg | Freq: Once | INTRAMUSCULAR | Status: AC
  Administered 2018-05-09: 1 mg via INTRAMUSCULAR
  Filled 2018-05-09: qty 1

## 2018-05-09 MED ORDER — METOCLOPRAMIDE HCL 5 MG/ML IJ SOLN: 10.0000 mg | Freq: Once | INTRAMUSCULAR | Status: DC

## 2018-05-09 MED ORDER — HYDROMORPHONE HCL 1 MG/ML IJ SOLN
2.0000 mg | Freq: Once | INTRAMUSCULAR | Status: AC
  Administered 2018-05-09: 2 mg via INTRAMUSCULAR
  Filled 2018-05-09: qty 2

## 2018-05-09 MED ORDER — HYDROMORPHONE HCL 1 MG/ML IJ SOLN: 1.0000 mg | Freq: Once | INTRAMUSCULAR | Status: DC

## 2018-05-09 MED ORDER — HYDROXYZINE HCL 50 MG/ML IM SOLN
25.0000 mg | Freq: Four times a day (QID) | INTRAMUSCULAR | Status: DC | PRN
  Filled 2018-05-09: qty 0.5

## 2018-05-09 MED ORDER — SUCCINYLCHOLINE CHLORIDE 20 MG/ML IJ SOLN: INTRAMUSCULAR | Status: AC | PRN

## 2018-05-09 MED ORDER — NEPHRO VITAMINS 0.8 MG PO TABS: 1.0000 | ORAL_TABLET | Freq: Every day | ORAL | Status: DC

## 2018-05-09 MED ORDER — DICYCLOMINE HCL 10 MG/5ML PO SOLN
10.0000 mg | Freq: Three times a day (TID) | ORAL | Status: DC
  Administered 2018-05-09 – 2018-05-15 (×13): 10 mg via ORAL
  Filled 2018-05-09 (×24): qty 5

## 2018-05-09 MED ORDER — DICLOFENAC SODIUM 1 % TD GEL
2.0000 g | Freq: Four times a day (QID) | TRANSDERMAL | Status: DC | PRN
  Filled 2018-05-09: qty 100

## 2018-05-09 MED ORDER — HYDRALAZINE HCL 50 MG PO TABS
100.0000 mg | ORAL_TABLET | Freq: Three times a day (TID) | ORAL | Status: DC
  Administered 2018-05-09 – 2018-05-14 (×10): 100 mg via ORAL
  Filled 2018-05-09 (×14): qty 2

## 2018-05-09 MED ORDER — NITROGLYCERIN 0.4 MG SL SUBL: 0.4000 mg | SUBLINGUAL_TABLET | SUBLINGUAL | Status: DC | PRN

## 2018-05-09 MED ORDER — PANCRELIPASE (LIP-PROT-AMYL) 12000-38000 UNITS PO CPEP
12000.0000 [IU] | ORAL_CAPSULE | Freq: Three times a day (TID) | ORAL | Status: DC
  Administered 2018-05-10 – 2018-05-15 (×13): 12000 [IU] via ORAL
  Filled 2018-05-09 (×14): qty 1

## 2018-05-09 MED ORDER — LANTHANUM CARBONATE 500 MG PO CHEW
1000.0000 mg | CHEWABLE_TABLET | Freq: Three times a day (TID) | ORAL | Status: DC
  Administered 2018-05-10 – 2018-05-15 (×8): 1000 mg via ORAL
  Filled 2018-05-09 (×11): qty 2

## 2018-05-09 MED ORDER — CARVEDILOL 25 MG PO TABS
25.0000 mg | ORAL_TABLET | Freq: Two times a day (BID) | ORAL | Status: DC
  Administered 2018-05-10 – 2018-05-14 (×7): 25 mg via ORAL
  Filled 2018-05-09 (×3): qty 1
  Filled 2018-05-09 (×2): qty 2
  Filled 2018-05-09: qty 1
  Filled 2018-05-09: qty 2
  Filled 2018-05-09 (×2): qty 1

## 2018-05-09 MED ORDER — RENA-VITE PO TABS: 1.0000 | ORAL_TABLET | Freq: Every day | ORAL | Status: DC

## 2018-05-09 MED ORDER — ONDANSETRON HCL 4 MG/2ML IJ SOLN: 4.0000 mg | Freq: Four times a day (QID) | INTRAMUSCULAR | Status: DC | PRN

## 2018-05-09 MED ORDER — AMLODIPINE BESYLATE 5 MG PO TABS
5.0000 mg | ORAL_TABLET | Freq: Every day | ORAL | Status: DC
  Administered 2018-05-09 – 2018-05-14 (×6): 5 mg via ORAL
  Filled 2018-05-09 (×7): qty 1

## 2018-05-09 MED ORDER — HYDRALAZINE HCL 20 MG/ML IJ SOLN
5.0000 mg | INTRAMUSCULAR | Status: DC | PRN
  Administered 2018-05-09: 5 mg via INTRAVENOUS
  Filled 2018-05-09 (×2): qty 1

## 2018-05-09 MED ORDER — MORPHINE SULFATE (PF) 2 MG/ML IV SOLN
2.0000 mg | INTRAVENOUS | Status: DC | PRN
  Administered 2018-05-09 – 2018-05-13 (×17): 2 mg via INTRAVENOUS
  Filled 2018-05-09 (×18): qty 1

## 2018-05-09 MED ORDER — PANTOPRAZOLE SODIUM 40 MG PO TBEC
40.0000 mg | DELAYED_RELEASE_TABLET | Freq: Every day | ORAL | Status: DC
  Administered 2018-05-10 – 2018-05-14 (×5): 40 mg via ORAL
  Filled 2018-05-09 (×5): qty 1

## 2018-05-09 MED ORDER — DEXTROSE 50 % IV SOLN
1.0000 | Freq: Once | INTRAVENOUS | Status: AC
  Administered 2018-05-09: 50 mL via INTRAVENOUS
  Filled 2018-05-09: qty 50

## 2018-05-09 MED ORDER — ZOLPIDEM TARTRATE 5 MG PO TABS
5.0000 mg | ORAL_TABLET | Freq: Every day | ORAL | Status: DC
Start: 2018-05-09 — End: 2018-05-14
  Administered 2018-05-09 – 2018-05-14 (×4): 5 mg via ORAL
  Filled 2018-05-09 (×5): qty 1

## 2018-05-09 MED ORDER — ALPRAZOLAM 0.25 MG PO TABS
0.2500 mg | ORAL_TABLET | Freq: Two times a day (BID) | ORAL | Status: DC | PRN
  Administered 2018-05-10: 0.25 mg via ORAL
  Filled 2018-05-09: qty 1

## 2018-05-09 MED ORDER — APIXABAN 5 MG PO TABS
5.0000 mg | ORAL_TABLET | Freq: Two times a day (BID) | ORAL | Status: DC
  Administered 2018-05-09 – 2018-05-15 (×10): 5 mg via ORAL
  Filled 2018-05-09 (×12): qty 1

## 2018-05-09 MED ORDER — DEXTROSE 5 % IV SOLN
Freq: Once | INTRAVENOUS | Status: AC
  Administered 2018-05-09: 19:00:00 via INTRAVENOUS

## 2018-05-09 MED ORDER — IOHEXOL 300 MG/ML  SOLN
100.0000 mL | Freq: Once | INTRAMUSCULAR | Status: AC | PRN
  Administered 2018-05-09: 100 mL via INTRAVENOUS

## 2018-05-09 MED ORDER — IOPAMIDOL (ISOVUE-370) INJECTION 76%
INTRAVENOUS | Status: AC
  Filled 2018-05-09: qty 100

## 2018-05-09 MED ORDER — NEPHRO-VITE 0.8 MG PO TABS
1.0000 | ORAL_TABLET | Freq: Every day | ORAL | Status: DC
  Filled 2018-05-09: qty 1

## 2018-05-09 MED ORDER — LISINOPRIL 5 MG PO TABS
5.0000 mg | ORAL_TABLET | Freq: Every day | ORAL | Status: DC
  Administered 2018-05-09 – 2018-05-14 (×6): 5 mg via ORAL
  Filled 2018-05-09 (×6): qty 1

## 2018-05-09 MED ORDER — METOCLOPRAMIDE HCL 5 MG/ML IJ SOLN
10.0000 mg | Freq: Once | INTRAMUSCULAR | Status: AC
  Administered 2018-05-09: 10 mg via INTRAMUSCULAR
  Filled 2018-05-09: qty 2

## 2018-05-09 MED ORDER — DEXTROSE 50 % IV SOLN
50.0000 mL | INTRAVENOUS | Status: DC | PRN
  Administered 2018-05-09 – 2018-05-10 (×2): 50 mL via INTRAVENOUS
  Filled 2018-05-09 (×2): qty 50

## 2018-05-09 MED ORDER — PROMETHAZINE HCL 25 MG/ML IJ SOLN
25.0000 mg | Freq: Once | INTRAMUSCULAR | Status: AC
  Administered 2018-05-09: 25 mg via INTRAMUSCULAR
  Filled 2018-05-09: qty 1

## 2018-05-09 MED ORDER — HYDROXYZINE HCL 10 MG PO TABS: 10.0000 mg | ORAL_TABLET | Freq: Three times a day (TID) | ORAL | Status: DC | PRN

## 2018-05-09 NOTE — ED Notes (Signed)
Apple juice given. Pt AO x 4

## 2018-05-09 NOTE — ED Notes (Signed)
ED Provider at bedside. 
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
## 2018-05-09 NOTE — Consult Note (Signed)
Spoke with ER RN Drue Dun, she is aware that pt has very limited IV sites and is going to get order to give medications IM

## 2018-05-09 NOTE — ED Triage Notes (Signed)
Pt to ER reports mid abd pain radiating through to his back that is persistent since his visit last night. Reports nausea and vomiting. States missed dialysis today due to car trouble. NAD at this time.

## 2018-05-09 NOTE — ED Notes (Signed)
EDP made aware of CBG. Pt given juice and peanut butter crackers

## 2018-05-09 NOTE — ED Notes (Signed)
[place] sandwich and Ginger ale given

## 2018-05-09 NOTE — ED Notes (Signed)
Report given to Maryfrances Bunnell- IV team consult due to difficult IV start.

## 2018-05-09 NOTE — ED Notes (Signed)
Pt given heat packs for arm.

## 2018-05-09 NOTE — Discharge Instructions (Addendum)
Continue your medications as before.  Follow-up up with your primary doctor as needed.
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
## 2018-05-10 ENCOUNTER — Other Ambulatory Visit: Payer: Self-pay

## 2018-05-10 ENCOUNTER — Encounter (HOSPITAL_COMMUNITY): Payer: Self-pay | Admitting: Nephrology

## 2018-05-10 ENCOUNTER — Inpatient Hospital Stay (HOSPITAL_COMMUNITY): Payer: Medicare Other

## 2018-05-10 DIAGNOSIS — Z992 Dependence on renal dialysis: Secondary | ICD-10-CM | POA: Diagnosis not present

## 2018-05-10 DIAGNOSIS — I2699 Other pulmonary embolism without acute cor pulmonale: Secondary | ICD-10-CM | POA: Diagnosis not present

## 2018-05-10 DIAGNOSIS — F1120 Opioid dependence, uncomplicated: Secondary | ICD-10-CM | POA: Diagnosis not present

## 2018-05-10 DIAGNOSIS — Z79899 Other long term (current) drug therapy: Secondary | ICD-10-CM | POA: Diagnosis not present

## 2018-05-10 DIAGNOSIS — G894 Chronic pain syndrome: Secondary | ICD-10-CM | POA: Diagnosis not present

## 2018-05-10 DIAGNOSIS — R1010 Upper abdominal pain, unspecified: Secondary | ICD-10-CM | POA: Diagnosis not present

## 2018-05-10 DIAGNOSIS — D631 Anemia in chronic kidney disease: Secondary | ICD-10-CM | POA: Diagnosis not present

## 2018-05-10 DIAGNOSIS — E162 Hypoglycemia, unspecified: Secondary | ICD-10-CM | POA: Diagnosis present

## 2018-05-10 DIAGNOSIS — F418 Other specified anxiety disorders: Secondary | ICD-10-CM | POA: Diagnosis not present

## 2018-05-10 DIAGNOSIS — R112 Nausea with vomiting, unspecified: Secondary | ICD-10-CM | POA: Diagnosis not present

## 2018-05-10 DIAGNOSIS — K861 Other chronic pancreatitis: Secondary | ICD-10-CM | POA: Diagnosis not present

## 2018-05-10 DIAGNOSIS — F4323 Adjustment disorder with mixed anxiety and depressed mood: Secondary | ICD-10-CM | POA: Diagnosis not present

## 2018-05-10 DIAGNOSIS — N186 End stage renal disease: Secondary | ICD-10-CM | POA: Diagnosis not present

## 2018-05-10 DIAGNOSIS — R197 Diarrhea, unspecified: Secondary | ICD-10-CM | POA: Diagnosis not present

## 2018-05-10 DIAGNOSIS — I5042 Chronic combined systolic (congestive) and diastolic (congestive) heart failure: Secondary | ICD-10-CM | POA: Diagnosis not present

## 2018-05-10 DIAGNOSIS — I132 Hypertensive heart and chronic kidney disease with heart failure and with stage 5 chronic kidney disease, or end stage renal disease: Secondary | ICD-10-CM | POA: Diagnosis not present

## 2018-05-10 DIAGNOSIS — R1084 Generalized abdominal pain: Secondary | ICD-10-CM | POA: Diagnosis not present

## 2018-05-10 DIAGNOSIS — N2581 Secondary hyperparathyroidism of renal origin: Secondary | ICD-10-CM | POA: Diagnosis not present

## 2018-05-10 DIAGNOSIS — I120 Hypertensive chronic kidney disease with stage 5 chronic kidney disease or end stage renal disease: Secondary | ICD-10-CM | POA: Diagnosis not present

## 2018-05-10 DIAGNOSIS — K219 Gastro-esophageal reflux disease without esophagitis: Secondary | ICD-10-CM | POA: Diagnosis not present

## 2018-05-10 DIAGNOSIS — J810 Acute pulmonary edema: Secondary | ICD-10-CM | POA: Diagnosis not present

## 2018-05-10 DIAGNOSIS — R109 Unspecified abdominal pain: Secondary | ICD-10-CM | POA: Diagnosis not present

## 2018-05-10 DIAGNOSIS — E46 Unspecified protein-calorie malnutrition: Secondary | ICD-10-CM | POA: Diagnosis not present

## 2018-05-10 LAB — GLUCOSE, CAPILLARY
GLUCOSE-CAPILLARY: 121 mg/dL — AB (ref 70–99)
GLUCOSE-CAPILLARY: 123 mg/dL — AB (ref 70–99)
GLUCOSE-CAPILLARY: 70 mg/dL (ref 70–99)
GLUCOSE-CAPILLARY: 71 mg/dL (ref 70–99)
Glucose-Capillary: 57 mg/dL — ABNORMAL LOW (ref 70–99)
Glucose-Capillary: 142 mg/dL — ABNORMAL HIGH (ref 70–99)
Glucose-Capillary: 86 mg/dL (ref 70–99)
Glucose-Capillary: 118 mg/dL — ABNORMAL HIGH (ref 70–99)

## 2018-05-10 LAB — BASIC METABOLIC PANEL
Anion gap: 16 — ABNORMAL HIGH (ref 5–15)
BUN: 64 mg/dL — AB (ref 6–20)
CHLORIDE: 99 mmol/L (ref 98–111)
CO2: 20 mmol/L — ABNORMAL LOW (ref 22–32)
Calcium: 8.7 mg/dL — ABNORMAL LOW (ref 8.9–10.3)
Creatinine, Ser: 13.18 mg/dL — ABNORMAL HIGH (ref 0.61–1.24)
GFR, EST AFRICAN AMERICAN: 4 mL/min — AB (ref >60)
GFR, EST NON AFRICAN AMERICAN: 4 mL/min — AB (ref >60)
Glucose, Bld: 68 mg/dL — ABNORMAL LOW (ref 70–99)
POTASSIUM: 5.4 mmol/L — AB (ref 3.5–5.1)
SODIUM: 135 mmol/L (ref 135–145)

## 2018-05-10 LAB — CBC
HCT: 25.4 % — ABNORMAL LOW (ref 39.0–52.0)
HEMOGLOBIN: 7.9 g/dL — AB (ref 13.0–17.0)
MCH: 26.5 pg (ref 26.0–34.0)
MCHC: 31.1 g/dL (ref 30.0–36.0)
MCV: 85.2 fL (ref 80.0–100.0)
Platelets: 208 10*3/uL (ref 150–400)
RBC: 2.98 MIL/uL — ABNORMAL LOW (ref 4.22–5.81)
RDW: 18.1 % — ABNORMAL HIGH (ref 11.5–15.5)
WBC: 7.1 10*3/uL (ref 4.0–10.5)
nRBC: 0 % (ref 0.0–0.2)

## 2018-05-10 LAB — MRSA PCR SCREENING: MRSA BY PCR: NEGATIVE

## 2018-05-10 MED ORDER — CHLORHEXIDINE GLUCONATE CLOTH 2 % EX PADS: 6.0000 | MEDICATED_PAD | Freq: Every day | CUTANEOUS | Status: DC

## 2018-05-10 MED ORDER — METOCLOPRAMIDE HCL 5 MG/ML IJ SOLN
5.0000 mg | Freq: Four times a day (QID) | INTRAMUSCULAR | Status: DC
  Administered 2018-05-10 – 2018-05-15 (×15): 5 mg via INTRAVENOUS
  Filled 2018-05-10 (×17): qty 2

## 2018-05-10 NOTE — Progress Notes (Signed)
Pt is alert and oriented X4. He refused respiratory, cardiac and skin assessment to be done. He also refused bed alarm to be turned on. He has been educated on the implications of his decisions and he verbalized understanding.

## 2018-05-10 NOTE — Plan of Care (Signed)
  Problem: Education: Goal: Knowledge of General Education information will improve Description Including pain rating scale, medication(s)/side effects and non-pharmacologic comfort measures Outcome: Progressing   Problem: Health Behavior/Discharge Planning: Goal: Ability to manage health-related needs will improve Outcome: Progressing   Problem: Activity: Goal: Risk for activity intolerance will decrease Outcome: Progressing   Problem: Coping: Goal: Level of anxiety will decrease Outcome: Progressing   Problem: Elimination: Goal: Will not experience complications related to bowel motility Outcome: Progressing   Problem: Pain Managment: Goal: General experience of comfort will improve Outcome: Progressing   Problem: Safety: Goal: Ability to remain free from injury will improve Outcome: Progressing   Problem: Education: Goal: Ability to demonstrate management of disease process will improve Outcome: Progressing

## 2018-05-10 NOTE — Progress Notes (Signed)
Patient refused soapsuds enema. Patient stated: "I don't need that I have already used the bathroom".  Patient's pain remains at 8 stated by patient.  MD paged.
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
## 2018-05-10 NOTE — Progress Notes (Signed)
Patient refused evening medications. MD notified.

## 2018-05-10 NOTE — Progress Notes (Signed)
Patient's reports pain is not relieved with pain medications. Patient requests pain medication during every nursing rounding time.

## 2018-05-11 DIAGNOSIS — I10 Essential (primary) hypertension: Secondary | ICD-10-CM | POA: Diagnosis not present

## 2018-05-11 DIAGNOSIS — R109 Unspecified abdominal pain: Secondary | ICD-10-CM | POA: Diagnosis not present

## 2018-05-11 DIAGNOSIS — N186 End stage renal disease: Secondary | ICD-10-CM | POA: Diagnosis not present

## 2018-05-11 DIAGNOSIS — R197 Diarrhea, unspecified: Secondary | ICD-10-CM | POA: Diagnosis not present

## 2018-05-11 DIAGNOSIS — K219 Gastro-esophageal reflux disease without esophagitis: Secondary | ICD-10-CM | POA: Diagnosis not present

## 2018-05-11 DIAGNOSIS — N185 Chronic kidney disease, stage 5: Secondary | ICD-10-CM

## 2018-05-11 DIAGNOSIS — D631 Anemia in chronic kidney disease: Secondary | ICD-10-CM | POA: Diagnosis not present

## 2018-05-11 DIAGNOSIS — Z992 Dependence on renal dialysis: Secondary | ICD-10-CM | POA: Diagnosis not present

## 2018-05-11 DIAGNOSIS — I120 Hypertensive chronic kidney disease with stage 5 chronic kidney disease or end stage renal disease: Secondary | ICD-10-CM | POA: Diagnosis not present

## 2018-05-11 DIAGNOSIS — R112 Nausea with vomiting, unspecified: Secondary | ICD-10-CM | POA: Diagnosis not present

## 2018-05-11 DIAGNOSIS — K861 Other chronic pancreatitis: Secondary | ICD-10-CM | POA: Diagnosis not present

## 2018-05-11 DIAGNOSIS — E162 Hypoglycemia, unspecified: Secondary | ICD-10-CM | POA: Diagnosis not present

## 2018-05-11 DIAGNOSIS — R1084 Generalized abdominal pain: Secondary | ICD-10-CM | POA: Diagnosis not present

## 2018-05-11 DIAGNOSIS — I5042 Chronic combined systolic (congestive) and diastolic (congestive) heart failure: Secondary | ICD-10-CM | POA: Diagnosis not present

## 2018-05-11 DIAGNOSIS — F4323 Adjustment disorder with mixed anxiety and depressed mood: Secondary | ICD-10-CM | POA: Diagnosis not present

## 2018-05-11 LAB — CBC
HEMATOCRIT: 24.9 % — AB (ref 39.0–52.0)
HEMOGLOBIN: 7.7 g/dL — AB (ref 13.0–17.0)
MCH: 26.8 pg (ref 26.0–34.0)
MCHC: 30.9 g/dL (ref 30.0–36.0)
MCV: 86.8 fL (ref 80.0–100.0)
NRBC: 0 % (ref 0.0–0.2)
Platelets: 197 10*3/uL (ref 150–400)
RBC: 2.87 MIL/uL — AB (ref 4.22–5.81)
RDW: 17.9 % — ABNORMAL HIGH (ref 11.5–15.5)
WBC: 6.4 10*3/uL (ref 4.0–10.5)

## 2018-05-11 LAB — RENAL FUNCTION PANEL
ALBUMIN: 2.5 g/dL — AB (ref 3.5–5.0)
Anion gap: 13 (ref 5–15)
BUN: 72 mg/dL — AB (ref 6–20)
CALCIUM: 8.4 mg/dL — AB (ref 8.9–10.3)
CO2: 20 mmol/L — ABNORMAL LOW (ref 22–32)
CREATININE: 14.03 mg/dL — AB (ref 0.61–1.24)
Chloride: 101 mmol/L (ref 98–111)
GFR calc Af Amer: 4 mL/min — ABNORMAL LOW (ref >60)
GFR, EST NON AFRICAN AMERICAN: 3 mL/min — AB (ref >60)
Glucose, Bld: 105 mg/dL — ABNORMAL HIGH (ref 70–99)
Phosphorus: 5.7 mg/dL — ABNORMAL HIGH (ref 2.5–4.6)
Potassium: 5.3 mmol/L — ABNORMAL HIGH (ref 3.5–5.1)
Sodium: 134 mmol/L — ABNORMAL LOW (ref 135–145)

## 2018-05-11 LAB — MAGNESIUM: MAGNESIUM: 1.9 mg/dL (ref 1.7–2.4)

## 2018-05-11 LAB — GLUCOSE, CAPILLARY
GLUCOSE-CAPILLARY: 145 mg/dL — AB (ref 70–99)
Glucose-Capillary: 66 mg/dL — ABNORMAL LOW (ref 70–99)
Glucose-Capillary: 129 mg/dL — ABNORMAL HIGH (ref 70–99)
Glucose-Capillary: 106 mg/dL — ABNORMAL HIGH (ref 70–99)
Glucose-Capillary: 134 mg/dL — ABNORMAL HIGH (ref 70–99)

## 2018-05-11 MED ORDER — POLYETHYLENE GLYCOL 3350 17 G PO PACK
17.0000 g | PACK | Freq: Two times a day (BID) | ORAL | Status: DC
  Filled 2018-05-11 (×7): qty 1

## 2018-05-11 MED ORDER — SENNOSIDES-DOCUSATE SODIUM 8.6-50 MG PO TABS
1.0000 | ORAL_TABLET | Freq: Two times a day (BID) | ORAL | Status: DC
  Administered 2018-05-11: 1 via ORAL
  Filled 2018-05-11 (×3): qty 1

## 2018-05-11 NOTE — Progress Notes (Signed)
Pt refused to be weighed this morning

## 2018-05-11 NOTE — Progress Notes (Signed)
Patient refused his bed to be on the lowest position, he wants his bed higher to dangle his legs.  Patient was educated of the risk of falling out of bed  but  replied, " I want it that way". Patient is low fall risk  With a score of 3. Will monitor accordingly.

## 2018-05-11 NOTE — Progress Notes (Signed)
Pt. Refused to stand for standing weight stes "because I dont want to" bed weight obtained. Dr. Marval Regal aware

## 2018-05-11 NOTE — Progress Notes (Addendum)
PROGRESS NOTE  Frank [name]:[id number] DOB: Dec 08, 1965 DOA: 05/09/2018 PCP: Patient, [name]  HPI/Recap of past 24 hours: HPI from Dr Sharlynn Oliphant [name] a 52 y.o.malewith medical history significant ofhypertension, GERD, depression with anxiety, chronic pancreatitis, CHF with EF of 35%, ESRD-HD (M/W/F), DVT/PE on Eliquis, who presents with abdominal pain X 3 days, nausea, vomiting, diarrhea. Pt was offered a repeat CT scan, which he has declined. Pt is worried about the amount of radiation he has received. Pt admitted for further management.   Today, saw pt after HD. Met pt sleeping, reported he didn't want to be examined as he was somewhat comfortable in the position his in and didn't want to be moved to avoid aggravating his abdominal pain. Denies any chest pain, SOB, N/V, fever/chills.  Assessment/Plan: Principal Problem:   Abdominal pain Active Problems:   Anemia of chronic kidney failure   Nausea vomiting and diarrhea   Essential hypertension   Adjustment disorder with mixed anxiety and depressed mood   Chronic combined systolic and diastolic CHF (congestive heart failure) (HCC)   GERD (gastroesophageal reflux disease)   DVT (deep venous thrombosis) (HCC)   End-stage renal disease on hemodialysis ([place])   PE (pulmonary thromboembolism) ([name])   Chronic pancreatitis ([place])   Hypoglycemia   ?Chronic pancreatitis with chronic pain syndrome Etiology unclear Afebrile, no leukocytosis LFTs, lipase WNL CT abdomen pelvis unremarkable for any acute changes X-ray abdomen/chest unremarkable for any bowel issues, cardiomegaly, large right pleural effusion Continue Bentyl, Creon, Reglan, hydroxyzine Continue oxycodone, morphine, miralax, senna May consult GI if pain persists Monitor closely  ESRD/Hyperkalemia HD per nephrology Telemetry  Anemia of chronic kidney failure Baseline hemoglobin around 10 Anemia panel: iron 34, sats 22%, ferritin 301  Essential  hypertension Continue home amlodipine, Coreg, hydralazine, lisinopril  GERD Protonix  Chronic combined systolic and diastolic CHF Echo on 6/54/6503 showed EF 35-40% Volume management per renal for HD  DVTand PE Eliquis  Adjustment disorder with mixed anxiety and depressed mood No SI or HI Continue home medications      Code Status: Full  Family Communication: None at bedside  Disposition Plan: TBD   Consultants:  Nephrology  Procedures:  None   Antimicrobials:  None  DVT prophylaxis: Eliquis   Objective: Vitals:   05/11/18 1030 05/11/18 1100 05/11/18 1130 05/11/18 1232  BP: (!) 149/97 139/84 131/74 133/74  Pulse: 79 77 80 84  Resp:    16  Temp:    98.2 F (36.8 C)  TempSrc:    Oral  SpO2:    93%  Weight:      Height:       No intake or output data in the 24 hours ending 05/11/18 1347 Filed Weights   05/09/18 2208 05/10/18 0418 05/11/18 0745  Weight: 80.1 kg 80 kg 82.7 kg    Exam: Refused to be examined on 05/11/18   General:  NAD  Cardiovascular:   Respiratory:    Abdomen:   Musculoskeletal:    Skin:  Psychiatry:    Data Reviewed: CBC: Recent Labs  Lab 05/08/18 1918 05/09/18 1218 05/10/18 0644 05/11/18 0610  WBC 6.2 5.5 7.1 6.4  HGB 8.0* 9.0* 7.9* 7.7*  HCT 25.8* 31.2* 25.4* 24.9*  MCV 88.1 92.0 85.2 86.8  PLT 231 208 208 546   Basic Metabolic Panel: Recent Labs  Lab 05/08/18 1918 05/09/18 1218 05/10/18 0644 05/11/18 0810  NA 137 137 135 134*  K 4.4 5.0 5.4* 5.3*  CL 99 100 99  101  CO2 25 21* 20* 20*  GLUCOSE 81 65* 68* 105*  BUN 49* 56* 64* 72*  CREATININE 10.51* 11.99* 13.18* 14.03*  CALCIUM 9.1 9.1 8.7* 8.4*  MG  --   --   --  1.9  PHOS  --   --   --  5.7*   GFR: Estimated Creatinine Clearance: 7 mL/min (A) (by C-G formula based on SCr of 14.03 mg/dL (H)). Liver Function Tests: Recent Labs  Lab 05/08/18 1918 05/09/18 1218 05/11/18 0810  AST 19 30  --   ALT 12 12  --   ALKPHOS 126 119  --    BILITOT 0.6 0.9  --   PROT 7.4 7.8  --   ALBUMIN 2.6* 2.9* 2.5*   Recent Labs  Lab 05/08/18 1918 05/09/18 1218  LIPASE 93* 48   No results for input(s): AMMONIA in the last 168 hours. Coagulation Profile: No results for input(s): INR, PROTIME in the last 168 hours. Cardiac Enzymes: No results for input(s): CKTOTAL, CKMB, CKMBINDEX, TROPONINI in the last 168 hours. BNP (last 3 results) No results for input(s): PROBNP in the last 8760 hours. HbA1C: No results for input(s): HGBA1C in the last 72 hours. CBG: Recent Labs  Lab 05/10/18 1643 05/10/18 2054 05/11/18 0341 05/11/18 0744 05/11/18 1230  GLUCAP 71 121* 66* 145* 129*   Lipid Profile: No results for input(s): CHOL, HDL, LDLCALC, TRIG, CHOLHDL, LDLDIRECT in the last 72 hours. Thyroid Function Tests: No results for input(s): TSH, T4TOTAL, FREET4, T3FREE, THYROIDAB in the last 72 hours. Anemia Panel: No results for input(s): VITAMINB12, FOLATE, FERRITIN, TIBC, IRON, RETICCTPCT in the last 72 hours. Urine analysis:    Component Value Date/Time   COLORURINE YELLOW 10/18/2013 0419   APPEARANCEUR CLEAR 10/18/2013 0419   LABSPEC 1.008 10/18/2013 0419   PHURINE 8.5 (H) 10/18/2013 0419   GLUCOSEU 100 (A) 10/18/2013 0419   HGBUR TRACE (A) 10/18/2013 0419   BILIRUBINUR NEGATIVE 10/18/2013 0419   KETONESUR NEGATIVE 10/18/2013 0419   PROTEINUR 100 (A) 10/18/2013 0419   UROBILINOGEN 0.2 10/18/2013 0419   NITRITE NEGATIVE 10/18/2013 0419   LEUKOCYTESUR NEGATIVE 10/18/2013 0419   Sepsis Labs: _0 (procalcitonin:4,lacticidven:4)  ) Recent Results (from the past 240 hour(s))  MRSA PCR Screening     Status: None   Collection Time: 05/10/18  4:21 AM  Result Value Ref Range Status   MRSA by PCR NEGATIVE NEGATIVE Final    Comment:        The GeneXpert MRSA Assay (FDA approved for NASAL specimens only), is one component of a comprehensive MRSA colonization surveillance program. It is not intended to diagnose  MRSA infection nor to guide or monitor treatment for MRSA infections. Performed at [facility], [place] [address] [place], [place] [address]       Studies: Dg Abd Acute W/chest  Result Date: 05/10/2018 CLINICAL DATA:  Upper abdominal pain, nausea vomiting. EXAM: DG ABDOMEN ACUTE W/ 1V CHEST COMPARISON:  Body CT 05/09/2018 FINDINGS: Large right pleural effusion with right lower lobe atelectasis versus airspace consolidation. Prominence of the vascular markings bilaterally. Enlarged cardiac silhouette. Calcific atherosclerotic disease of the aorta. Nonobstructive nonspecific bowel gas pattern with relative paucity of small bowel gas and preserved colonic bowel gas pattern. IMPRESSION: Nonobstructive nonspecific bowel gas pattern. Large right pleural effusion with pulmonary vascular congestion. Enlarged cardiac silhouette. Electronically Signed   By: Fidela Salisbury M.D.   On: 05/10/2018 16:28    Scheduled Meds: . amLODipine  5 mg Oral Daily  .  apixaban  5 mg Oral BID  . busPIRone  5 mg Oral BID  . carvedilol  25 mg Oral BID WC  . Chlorhexidine Gluconate Cloth  6 each Topical Q0600  . cinacalcet  90 mg Oral QPM  . dicyclomine  10 mg Oral TID AC & HS  . gabapentin  300 mg Oral QPM  . hydrALAZINE  100 mg Oral TID  . lanthanum  1,000 mg Oral TID WC  . lipase/protease/amylase  12,000 Units Oral TID AC  . lisinopril  5 mg Oral Daily  . metoCLOPramide (REGLAN) injection  5 mg Intravenous Q6H  . multivitamin  1 tablet Oral QHS  . pantoprazole  40 mg Oral Daily  . zolpidem  5 mg Oral QHS    Continuous Infusions:   LOS: 1 day     Alma Friendly, MD [facility]  If 7PM-7AM, please contact night-coverage [email or web address] 05/11/2018, 1:47 PM

## 2018-05-11 NOTE — Procedures (Signed)
I was present at this dialysis session. I have reviewed the session itself and made appropriate changes.   Vital signs in last 24 hours:  Temp:  [97.3 F (36.3 C)-97.8 F (36.6 C)] 97.7 F (36.5 C) (10/21 0745) Pulse Rate:  [63-80] 80 (10/21 0830) Resp:  [16-18] 18 (10/21 0745) BP: (112-142)/(70-97) 119/70 (10/21 0830) SpO2:  [96 %-100 %] 98 % (10/21 0745) Weight:  [82.7 kg] 82.7 kg (10/21 0745) Weight change:  Filed Weights   05/09/18 2208 05/10/18 0418 05/11/18 0745  Weight: 80.1 kg 80 kg 82.7 kg    Recent Labs  Lab 05/11/18 0810  NA 134*  K 5.3*  CL 101  CO2 20*  GLUCOSE 105*  BUN 72*  CREATININE 14.03*  CALCIUM 8.4*  PHOS 5.7*    Recent Labs  Lab 05/09/18 1218 05/10/18 0644 05/11/18 0610  WBC 5.5 7.1 6.4  HGB 9.0* 7.9* 7.7*  HCT 31.2* 25.4* 24.9*  MCV 92.0 85.2 86.8  PLT 208 208 197    Scheduled Meds: . amLODipine  5 mg Oral Daily  . apixaban  5 mg Oral BID  . busPIRone  5 mg Oral BID  . carvedilol  25 mg Oral BID WC  . Chlorhexidine Gluconate Cloth  6 each Topical Q0600  . cinacalcet  90 mg Oral QPM  . dicyclomine  10 mg Oral TID AC & HS  . gabapentin  300 mg Oral QPM  . hydrALAZINE  100 mg Oral TID  . lanthanum  1,000 mg Oral TID WC  . lipase/protease/amylase  12,000 Units Oral TID AC  . lisinopril  5 mg Oral Daily  . metoCLOPramide (REGLAN) injection  5 mg Intravenous Q6H  . multivitamin  1 tablet Oral QHS  . pantoprazole  40 mg Oral Daily  . zolpidem  5 mg Oral QHS   Continuous Infusions: PRN Meds:.ALPRAZolam, dextrose, diclofenac sodium, hydrALAZINE, hydrOXYzine, hydrOXYzine, morphine injection, nitroGLYCERIN, oxyCODONE    Dialysis: [facility]  4h  75kg  2/2.25 bath  LFA AVF  Hep none  - mircera 225 last 10/14  - hect 5 ug  Assessment: 1. Chronic pancreatitis with chronic pain syndrome and opioid addiction 2. ESRD 3. Noncompliance  4. HTN 5. DVT/PE 6. Anemia of CKD stage 5  Plan: 1. Continue with HD today and plan for another  short tx tomorrow since he is 7kg above edw and hasn't had HD in 5 days  Donetta Potts,  MD 05/11/2018, 8:59 AM

## 2018-05-12 DIAGNOSIS — F4323 Adjustment disorder with mixed anxiety and depressed mood: Secondary | ICD-10-CM | POA: Diagnosis not present

## 2018-05-12 DIAGNOSIS — K861 Other chronic pancreatitis: Secondary | ICD-10-CM | POA: Diagnosis not present

## 2018-05-12 DIAGNOSIS — I10 Essential (primary) hypertension: Secondary | ICD-10-CM | POA: Diagnosis not present

## 2018-05-12 DIAGNOSIS — R112 Nausea with vomiting, unspecified: Secondary | ICD-10-CM | POA: Diagnosis not present

## 2018-05-12 DIAGNOSIS — N186 End stage renal disease: Secondary | ICD-10-CM | POA: Diagnosis not present

## 2018-05-12 DIAGNOSIS — D631 Anemia in chronic kidney disease: Secondary | ICD-10-CM | POA: Diagnosis not present

## 2018-05-12 DIAGNOSIS — I5042 Chronic combined systolic (congestive) and diastolic (congestive) heart failure: Secondary | ICD-10-CM | POA: Diagnosis not present

## 2018-05-12 DIAGNOSIS — R197 Diarrhea, unspecified: Secondary | ICD-10-CM | POA: Diagnosis not present

## 2018-05-12 DIAGNOSIS — K219 Gastro-esophageal reflux disease without esophagitis: Secondary | ICD-10-CM | POA: Diagnosis not present

## 2018-05-12 DIAGNOSIS — N185 Chronic kidney disease, stage 5: Secondary | ICD-10-CM | POA: Diagnosis not present

## 2018-05-12 DIAGNOSIS — R1084 Generalized abdominal pain: Secondary | ICD-10-CM | POA: Diagnosis not present

## 2018-05-12 DIAGNOSIS — Z992 Dependence on renal dialysis: Secondary | ICD-10-CM | POA: Diagnosis not present

## 2018-05-12 DIAGNOSIS — E162 Hypoglycemia, unspecified: Secondary | ICD-10-CM | POA: Diagnosis not present

## 2018-05-12 DIAGNOSIS — I120 Hypertensive chronic kidney disease with stage 5 chronic kidney disease or end stage renal disease: Secondary | ICD-10-CM | POA: Diagnosis not present

## 2018-05-12 DIAGNOSIS — R109 Unspecified abdominal pain: Secondary | ICD-10-CM | POA: Diagnosis not present

## 2018-05-12 LAB — CBC WITH DIFFERENTIAL/PLATELET
Abs Immature Granulocytes: 0.07 10*3/uL (ref 0.00–0.07)
BASOS ABS: 0 10*3/uL (ref 0.0–0.1)
Basophils Relative: 0 %
EOS ABS: 0.2 10*3/uL (ref 0.0–0.5)
Eosinophils Relative: 2 %
HCT: 26.1 % — ABNORMAL LOW (ref 39.0–52.0)
HEMOGLOBIN: 8 g/dL — AB (ref 13.0–17.0)
Immature Granulocytes: 1 %
Lymphocytes Relative: 7 %
Lymphs Abs: 0.4 10*3/uL — ABNORMAL LOW (ref 0.7–4.0)
MCH: 26.8 pg (ref 26.0–34.0)
MCHC: 30.7 g/dL (ref 30.0–36.0)
MCV: 87.3 fL (ref 80.0–100.0)
Monocytes Absolute: 0.6 10*3/uL (ref 0.1–1.0)
Monocytes Relative: 9 %
NEUTROS PCT: 81 %
NRBC: 0 % (ref 0.0–0.2)
Neutro Abs: 5.5 10*3/uL (ref 1.7–7.7)
Platelets: 170 10*3/uL (ref 150–400)
RBC: 2.99 MIL/uL — AB (ref 4.22–5.81)
RDW: 18.4 % — AB (ref 11.5–15.5)
WBC: 6.8 10*3/uL (ref 4.0–10.5)

## 2018-05-12 LAB — RENAL FUNCTION PANEL
ALBUMIN: 2.5 g/dL — AB (ref 3.5–5.0)
Anion gap: 10 (ref 5–15)
BUN: 38 mg/dL — AB (ref 6–20)
CALCIUM: 8.6 mg/dL — AB (ref 8.9–10.3)
CO2: 23 mmol/L (ref 22–32)
CREATININE: 8.92 mg/dL — AB (ref 0.61–1.24)
Chloride: 101 mmol/L (ref 98–111)
GFR calc Af Amer: 7 mL/min — ABNORMAL LOW (ref >60)
GFR calc non Af Amer: 6 mL/min — ABNORMAL LOW (ref >60)
Glucose, Bld: 125 mg/dL — ABNORMAL HIGH (ref 70–99)
Phosphorus: 4.2 mg/dL (ref 2.5–4.6)
Potassium: 4.8 mmol/L (ref 3.5–5.1)
SODIUM: 134 mmol/L — AB (ref 135–145)

## 2018-05-12 LAB — GLUCOSE, CAPILLARY
GLUCOSE-CAPILLARY: 60 mg/dL — AB (ref 70–99)
Glucose-Capillary: 115 mg/dL — ABNORMAL HIGH (ref 70–99)
Glucose-Capillary: 68 mg/dL — ABNORMAL LOW (ref 70–99)
Glucose-Capillary: 122 mg/dL — ABNORMAL HIGH (ref 70–99)
Glucose-Capillary: 97 mg/dL (ref 70–99)
Glucose-Capillary: 117 mg/dL — ABNORMAL HIGH (ref 70–99)
Glucose-Capillary: 78 mg/dL (ref 70–99)

## 2018-05-12 MED ORDER — ONDANSETRON HCL 4 MG/2ML IJ SOLN
4.0000 mg | Freq: Four times a day (QID) | INTRAMUSCULAR | Status: DC | PRN
  Administered 2018-05-12 – 2018-05-15 (×4): 4 mg via INTRAVENOUS
  Filled 2018-05-12 (×4): qty 2

## 2018-05-12 MED ORDER — SENNOSIDES-DOCUSATE SODIUM 8.6-50 MG PO TABS
2.0000 | ORAL_TABLET | Freq: Two times a day (BID) | ORAL | Status: DC
  Administered 2018-05-14: 2 via ORAL
  Filled 2018-05-12 (×5): qty 2

## 2018-05-12 MED ORDER — MORPHINE SULFATE (PF) 2 MG/ML IV SOLN
INTRAVENOUS | Status: AC
  Administered 2018-05-12: 2 mg via INTRAVENOUS
  Filled 2018-05-12: qty 1

## 2018-05-12 NOTE — Progress Notes (Signed)
  [facility] Progress Note    Subjective:   Was hypoglycemic early this morning.  CBG's improved to122.  Still c/o of abdominal pain.   Objective:   BP (!) 146/96 (BP Location: Right Arm)   Pulse 85   Temp 98 F (36.7 C) (Oral)   Resp 18   Ht _0  (1.88 m)   Wt 78.5 kg   SpO2 92%   BMI 22.22 kg/m   Intake/Output: I/O last 3 completed shifts: In: 697 [P.O.:697] Out: 4003 [Other:4003]   Intake/Output this shift:  Total I/O In: 120 [P.O.:120] Out: -  Weight change:   Physical Exam: Gen:NAD CVS: no rub Resp: cta Abd: +BS, soft, tender to palpation Ext: no edema, LAVF +T/B  Labs: BMET Recent Labs  Lab 05/08/18 1918 05/09/18 1218 05/10/18 0644 05/11/18 0810  NA 137 137 135 134*  K 4.4 5.0 5.4* 5.3*  CL 99 100 99 101  CO2 25 21* 20* 20*  GLUCOSE 81 65* 68* 105*  BUN 49* 56* 64* 72*  CREATININE 10.51* 11.99* 13.18* 14.03*  ALBUMIN 2.6* 2.9*  --  2.5*  CALCIUM 9.1 9.1 8.7* 8.4*  PHOS  --   --   --  5.7*   CBC Recent Labs  Lab 05/08/18 1918 05/09/18 1218 05/10/18 0644 05/11/18 0610  WBC 6.2 5.5 7.1 6.4  HGB 8.0* 9.0* 7.9* 7.7*  HCT 25.8* 31.2* 25.4* 24.9*  MCV 88.1 92.0 85.2 86.8  PLT 231 208 208 197    _1 @ Medications:    . amLODipine  5 mg Oral Daily  . apixaban  5 mg Oral BID  . busPIRone  5 mg Oral BID  . carvedilol  25 mg Oral BID WC  . Chlorhexidine Gluconate Cloth  6 each Topical Q0600  . cinacalcet  90 mg Oral QPM  . dicyclomine  10 mg Oral TID AC & HS  . gabapentin  300 mg Oral QPM  . hydrALAZINE  100 mg Oral TID  . lanthanum  1,000 mg Oral TID WC  . lipase/protease/amylase  12,000 Units Oral TID AC  . lisinopril  5 mg Oral Daily  . metoCLOPramide (REGLAN) injection  5 mg Intravenous Q6H  . multivitamin  1 tablet Oral QHS  . pantoprazole  40 mg Oral Daily  . polyethylene glycol  17 g Oral BID  . senna-docusate  1 tablet Oral BID  . zolpidem  5 mg Oral QHS   Dialysis:[facility] 4h 75kg 2/2.25  bath LFA AVF Hep none - mircera 225 last 10/14 - hect 5 ug  Assessment/ Plan:   Asessment: 1. Chronic pancreatitis with chronic pain syndrome and opioid addiction 2. ESRD 3. Noncompliance  4. HTN 5. DVT/PE 6. Anemia of CKD stage 5  Plan: 1. Continue with HD again today and then back on schedule tomorrow due to marked volume overload. 2. Follow CBG's and encourage po intake 3. Dose darbopoeitin with HD tomorrow.   Donetta Potts, MD [place] Pager [phone] 05/12/2018, 11:01 AM

## 2018-05-12 NOTE — Progress Notes (Signed)
CBG 68. Hypoglycemia protocol initiated. Recheck CBG was 122.  Pt wants to do HD later this evening. HD informed.

## 2018-05-12 NOTE — Progress Notes (Addendum)
 PROGRESS NOTE  Kaelen D Kalata MRN:[id number] DOB: 10/04/1965 DOA: 05/09/2018 PCP: Patient, [name]  HPI/Recap of past 24 hours: HPI from Dr Sheehan Frank Rhodesis a 52 y.o.malewith medical history significant ofhypertension, GERD, depression with anxiety, chronic pancreatitis, CHF with EF of 35%, ESRD-HD (M/W/F), DVT/PE on Eliquis, who presents with abdominal pain X 3 days, nausea, vomiting, diarrhea. Pt was offered a repeat CT scan, which he has declined. Pt is worried about the amount of radiation he has received. Pt admitted for further management.   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 Reports loose stools.  Assessment/Plan: Principal Problem:   Abdominal pain Active Problems:   Anemia of chronic kidney failure   Nausea vomiting and diarrhea   Essential hypertension   Adjustment disorder with mixed anxiety and depressed mood   Chronic combined systolic and diastolic CHF (congestive heart failure) (HCC)   GERD (gastroesophageal reflux disease)   DVT (deep venous thrombosis) (HCC)   End-stage renal disease on hemodialysis (HCC)   PE (pulmonary thromboembolism) (HCC)   Chronic pancreatitis (HCC)   Hypoglycemia   ?Chronic pancreatitis with chronic pain syndrome Etiology unclear, ??pain seeking Afebrile, no leukocytosis LFTs, lipase WNL CT abdomen pelvis unremarkable for any acute changes X-ray abdomen/chest unremarkable for any bowel issues, cardiomegaly, large right pleural effusion Continue Bentyl, Creon, Reglan, hydroxyzine Continue oxycodone, morphine, miralax, senna Monitor closely  Hypoglycemia Noted some episodes of hypoglycemia (CBGs in 60s usually early am) ??malnutrition, reports ok appetite Not on any nephro supplement due to ?allergy Was on a mod-carb diet, will switch to plain renal diet Monitor closely, may consult dietician if persists  ESRD/Hyperkalemia HD per  nephrology  Anemia of chronic kidney failure Baseline hemoglobin around 10 Anemia panel: iron 34, sats 22%, ferritin 301  Essential hypertension Continue home amlodipine, Coreg, hydralazine, lisinopril  GERD Protonix  Chronic combined systolic and diastolic CHF Echo on 11/10/2017 showed EF 35-40% Volume management per renal for HD  DVTand PE Eliquis  Adjustment disorder with mixed anxiety and depressed mood No SI or HI Continue home medications      Code Status: Full  Family Communication: None at bedside  Disposition Plan: Likely discharge on 05/13/18 after HD   Consultants:  Nephrology  Procedures:  None   Antimicrobials:  None  DVT prophylaxis: Eliquis   Objective: Vitals:   05/11/18 1232 05/11/18 1923 05/12/18 0622 05/12/18 0651  BP: 133/74 (!) 164/92 (!) 167/114 (!) 146/96  Pulse: 84 84 92 85  Resp: 16 18 18   Temp: 98.2 F (36.8 C) 98 F (36.7 C)    TempSrc: Oral Oral Oral   SpO2: 93% 93% 92%   Weight:      Height:        Intake/Output Summary (Last 24 hours) at 05/12/2018 1115 Last data filed at 05/12/2018 0900 Gross per 24 hour  Intake 817 ml  Output 4003 ml  Net -3186 ml   Filed Weights   05/10/18 0418 05/11/18 0745 05/11/18 1159  Weight: 80 kg 82.7 kg 78.5 kg    Exam:    General:  NAD  Cardiovascular: S1, S2 present  Respiratory: Bibasilar crackles noted  Abdomen: Soft, mildly distended, generalized tenderness, BS present  Musculoskeletal: No pedal edema   Skin: Very dry, scaly skin  Psychiatry: Normal mood   Data Reviewed: CBC: Recent Labs  Lab 05/08/18 1918 05/09/18 1218 05/10/18 0644 05/11/18 0610  WBC 6.2 5.5   7.1 6.4  HGB 8.0* 9.0* 7.9* 7.7*  HCT 25.8* 31.2* 25.4* 24.9*  MCV 88.1 92.0 85.2 86.8  PLT 231 208 208 197   Basic Metabolic Panel: Recent Labs  Lab 05/08/18 1918 05/09/18 1218 05/10/18 0644 05/11/18 0810  NA 137 137 135 134*  K 4.4 5.0 5.4* 5.3*  CL 99 100 99 101  CO2 25  21* 20* 20*  GLUCOSE 81 65* 68* 105*  BUN 49* 56* 64* 72*  CREATININE 10.51* 11.99* 13.18* 14.03*  CALCIUM 9.1 9.1 8.7* 8.4*  MG  --   --   --  1.9  PHOS  --   --   --  5.7*   GFR: Estimated Creatinine Clearance: 6.7 mL/min (A) (by C-G formula based on SCr of 14.03 mg/dL (H)). Liver Function Tests: Recent Labs  Lab 05/08/18 1918 05/09/18 1218 05/11/18 0810  AST 19 30  --   ALT 12 12  --   ALKPHOS 126 119  --   BILITOT 0.6 0.9  --   PROT 7.4 7.8  --   ALBUMIN 2.6* 2.9* 2.5*   Recent Labs  Lab 05/08/18 1918 05/09/18 1218  LIPASE 93* 48   No results for input(s): AMMONIA in the last 168 hours. Coagulation Profile: No results for input(s): INR, PROTIME in the last 168 hours. Cardiac Enzymes: No results for input(s): CKTOTAL, CKMB, CKMBINDEX, TROPONINI in the last 168 hours. BNP (last 3 results) No results for input(s): PROBNP in the last 8760 hours. HbA1C: No results for input(s): HGBA1C in the last 72 hours. CBG: Recent Labs  Lab 05/11/18 2122 05/12/18 0315 05/12/18 0422 05/12/18 0801 05/12/18 0853  GLUCAP 134* 60* 115* 68* 122*   Lipid Profile: No results for input(s): CHOL, HDL, LDLCALC, TRIG, CHOLHDL, LDLDIRECT in the last 72 hours. Thyroid Function Tests: No results for input(s): TSH, T4TOTAL, FREET4, T3FREE, THYROIDAB in the last 72 hours. Anemia Panel: No results for input(s): VITAMINB12, FOLATE, FERRITIN, TIBC, IRON, RETICCTPCT in the last 72 hours. Urine analysis:    Component Value Date/Time   COLORURINE YELLOW 10/18/2013 0419   APPEARANCEUR CLEAR 10/18/2013 0419   LABSPEC 1.008 10/18/2013 0419   PHURINE 8.5 (H) 10/18/2013 0419   GLUCOSEU 100 (A) 10/18/2013 0419   HGBUR TRACE (A) 10/18/2013 0419   BILIRUBINUR NEGATIVE 10/18/2013 0419   KETONESUR NEGATIVE 10/18/2013 0419   PROTEINUR 100 (A) 10/18/2013 0419   UROBILINOGEN 0.2 10/18/2013 0419   NITRITE NEGATIVE 10/18/2013 0419   LEUKOCYTESUR NEGATIVE 10/18/2013 0419   Sepsis  Labs: @LABRCNTIP(procalcitonin:4,lacticidven:4)  ) Recent Results (from the past 240 hour(s))  MRSA PCR Screening     Status: None   Collection Time: 05/10/18  4:21 AM  Result Value Ref Range Status   MRSA by PCR NEGATIVE NEGATIVE Final    Comment:        The GeneXpert MRSA Assay (FDA approved for NASAL specimens only), is one component of a comprehensive MRSA colonization surveillance program. It is not intended to diagnose MRSA infection nor to guide or monitor treatment for MRSA infections. Performed at [facility], [address] [place], [place] [address]       Studies: No results found.  Scheduled Meds: . amLODipine  5 mg Oral Daily  . apixaban  5 mg Oral BID  . busPIRone  5 mg Oral BID  . carvedilol  25 mg Oral BID WC  . Chlorhexidine Gluconate Cloth  6 each Topical Q0600  . cinacalcet  90 mg Oral QPM  . dicyclomine    10 mg Oral TID AC & HS  . gabapentin  300 mg Oral QPM  . hydrALAZINE  100 mg Oral TID  . lanthanum  1,000 mg Oral TID WC  . lipase/protease/amylase  12,000 Units Oral TID AC  . lisinopril  5 mg Oral Daily  . metoCLOPramide (REGLAN) injection  5 mg Intravenous Q6H  . multivitamin  1 tablet Oral QHS  . pantoprazole  40 mg Oral Daily  . polyethylene glycol  17 g Oral BID  . senna-docusate  1 tablet Oral BID  . zolpidem  5 mg Oral QHS    Continuous Infusions:   LOS: 2 days     Nkeiruka J Ezenduka, MD [facility]  If 7PM-7AM, please contact night-coverage [email or web address] 05/12/2018, 11:15 AM   

## 2018-05-13 ENCOUNTER — Encounter (HOSPITAL_COMMUNITY): Payer: Self-pay | Admitting: General Practice

## 2018-05-13 DIAGNOSIS — I120 Hypertensive chronic kidney disease with stage 5 chronic kidney disease or end stage renal disease: Secondary | ICD-10-CM | POA: Diagnosis not present

## 2018-05-13 DIAGNOSIS — R1084 Generalized abdominal pain: Secondary | ICD-10-CM

## 2018-05-13 DIAGNOSIS — D631 Anemia in chronic kidney disease: Secondary | ICD-10-CM | POA: Diagnosis not present

## 2018-05-13 DIAGNOSIS — I10 Essential (primary) hypertension: Secondary | ICD-10-CM | POA: Diagnosis not present

## 2018-05-13 DIAGNOSIS — K861 Other chronic pancreatitis: Secondary | ICD-10-CM | POA: Diagnosis not present

## 2018-05-13 DIAGNOSIS — R109 Unspecified abdominal pain: Secondary | ICD-10-CM | POA: Diagnosis not present

## 2018-05-13 DIAGNOSIS — N186 End stage renal disease: Secondary | ICD-10-CM | POA: Diagnosis not present

## 2018-05-13 DIAGNOSIS — E162 Hypoglycemia, unspecified: Secondary | ICD-10-CM | POA: Diagnosis not present

## 2018-05-13 DIAGNOSIS — Z992 Dependence on renal dialysis: Secondary | ICD-10-CM | POA: Diagnosis not present

## 2018-05-13 DIAGNOSIS — I82A11 Acute embolism and thrombosis of right axillary vein: Secondary | ICD-10-CM | POA: Diagnosis not present

## 2018-05-13 LAB — RENAL FUNCTION PANEL
ALBUMIN: 2.4 g/dL — AB (ref 3.5–5.0)
Anion gap: 8 (ref 5–15)
BUN: 28 mg/dL — ABNORMAL HIGH (ref 6–20)
CALCIUM: 7.7 mg/dL — AB (ref 8.9–10.3)
CHLORIDE: 101 mmol/L (ref 98–111)
CO2: 26 mmol/L (ref 22–32)
CREATININE: 7.65 mg/dL — AB (ref 0.61–1.24)
GFR calc Af Amer: 8 mL/min — ABNORMAL LOW (ref >60)
GFR, EST NON AFRICAN AMERICAN: 7 mL/min — AB (ref >60)
Glucose, Bld: 80 mg/dL (ref 70–99)
PHOSPHORUS: 3.3 mg/dL (ref 2.5–4.6)
Potassium: 4.2 mmol/L (ref 3.5–5.1)
SODIUM: 135 mmol/L (ref 135–145)

## 2018-05-13 LAB — GLUCOSE, CAPILLARY
GLUCOSE-CAPILLARY: 81 mg/dL (ref 70–99)
Glucose-Capillary: 86 mg/dL (ref 70–99)
Glucose-Capillary: 88 mg/dL (ref 70–99)
Glucose-Capillary: 132 mg/dL — ABNORMAL HIGH (ref 70–99)

## 2018-05-13 LAB — CBC
HEMATOCRIT: 26.9 % — AB (ref 39.0–52.0)
Hemoglobin: 7.9 g/dL — ABNORMAL LOW (ref 13.0–17.0)
MCH: 25.9 pg — ABNORMAL LOW (ref 26.0–34.0)
MCHC: 29.4 g/dL — ABNORMAL LOW (ref 30.0–36.0)
MCV: 88.2 fL (ref 80.0–100.0)
NRBC: 0 % (ref 0.0–0.2)
Platelets: 180 10*3/uL (ref 150–400)
RBC: 3.05 MIL/uL — ABNORMAL LOW (ref 4.22–5.81)
RDW: 18.1 % — AB (ref 11.5–15.5)
WBC: 4.8 10*3/uL (ref 4.0–10.5)

## 2018-05-13 MED ORDER — MORPHINE SULFATE (PF) 2 MG/ML IV SOLN
INTRAVENOUS | Status: AC
  Administered 2018-05-13: 2 mg via INTRAVENOUS
  Filled 2018-05-13: qty 1

## 2018-05-13 MED ORDER — MORPHINE SULFATE (PF) 2 MG/ML IV SOLN
2.0000 mg | Freq: Four times a day (QID) | INTRAVENOUS | Status: DC | PRN
  Administered 2018-05-13 – 2018-05-14 (×2): 2 mg via INTRAVENOUS
  Filled 2018-05-13 (×2): qty 1

## 2018-05-13 MED ORDER — OXYCODONE HCL 5 MG PO TABS
ORAL_TABLET | ORAL | Status: AC
  Filled 2018-05-13: qty 1

## 2018-05-13 NOTE — Progress Notes (Signed)
HD tx initiated via 15Gx2 w/o problem Pull/push/flush well w/o problem VSS Will continue to monitor while on HD tx

## 2018-05-13 NOTE — Progress Notes (Signed)
Frank [name] Demographics:    Frank [name], is a 52 y.o. male, DOB - 07-Nov-1965, HRC:[id number]  Admit date - 05/09/2018   Admitting Physician Ivor Costa, MD  Outpatient Primary MD for the Frank [name] is Frank [name], [name]  LOS - 3   Chief Complaint  Frank [name] presents with  . Abdominal Pain        Subjective:    [name] today has no fevers,   No chest pain, Frank [name] complains of generalized weakness, wondering if he needs physical therapy and SNF, complains of emesis with attempts to eat, no further diarrhea  Assessment  & Plan :    Principal Problem:   Abdominal pain Active Problems:   Anemia of chronic kidney failure   Nausea vomiting and diarrhea   Essential hypertension   Adjustment disorder with mixed anxiety and depressed mood   Chronic combined systolic and diastolic CHF (congestive heart failure) (HCC)   GERD (gastroesophageal reflux disease)   DVT (deep venous thrombosis) (HCC)   End-stage renal disease on hemodialysis (HCC)   PE (pulmonary thromboembolism) ([place])   Chronic pancreatitis ([place])   Hypoglycemia  Brief summary 52 y.o. male with medical history significant of hypertension, GERD, depression with anxiety, chronic pancreatitis, CHF with EF of 35%, ESRD-HD (Monday Wednesday Friday), DVT/PE on Eliquis, small bowel obstruction, anemia and chronic pain syndrome with chronic opiate use admitted on 05/09/2018 with nausea vomiting diarrhea and abdominal pain after missing 2 hemodialysis sessions prior Wednesday and Friday prior to admission.  History of noncompliance  Plan:- 1)Chronic pancreatitis with chronic pain syndrome and opioid addiction----be judicious with opiates as this may contribute further to his GI problems, Frank [name] is a history of intermittent emesis abdominal discomfort declines further abdominal imaging, PRN Zofran and Reglan, c/n [place]  2)ESRD--- Frank [name] refused hemodialysis  in the a.m. of 05/13/2018 he says he will consider going for hemodialysis later in the evening  3)History of noncompliance--Frank [name] is notoriously noncompliant with hemodialysis sessions, lifestyle advise,  diet and medication regimen  4) chronic anemia of CKD--- EPO per nephrology, Baseline hemoglobin around 10, hemoglobin currently around 8, anemia panel: iron 34, sats 22%, ferritin 301  5)H/o DVT/PE--- stable, continue Eliquis 5 mg twice daily,   6)HTN-stable, continue Coreg 25 mg twice daily, amlodipine 5 mg daily, hydralazine 100 mg 3 times daily, and lisinopril 5 mg daily  7)HFrEF/history of combined diastolic and systolic dysfunction CHF with EF and 35% range, volume status being addressed with hemodialysis, appears compensated  8)Recurrent hypoglycemia--the setting of poor oral intake and emesis, small frequent meals advised, antiemetics as prescribed  9)Depression/Anxiety-denies suicidal homicidal ideation or plan, continue buspirone 5 mg twice daily, continue hydroxyzine 10 mg 3 times daily as needed for anxiety, continue Xanax 0.5 mg twice daily as needed anxiety and insomnia  Disposition/Need for in-Hospital Stay- Frank [name] unable to be discharged at this time due to persistent emesis with recurrent hypoglycemic episodes, also Frank [name] may need SNF rehab, awaiting physical therapy evaluation  Code Status : Full   Disposition Plan  : ???SNF  Consults  :  Nephrology  DVT Prophylaxis  :  Eliquis  Lab Results  Component Value Date   PLT 170 05/12/2018    Inpatient Medications  Scheduled Meds: . amLODipine  5 mg Oral  Daily  . apixaban  5 mg Oral BID  . busPIRone  5 mg Oral BID  . carvedilol  25 mg Oral BID WC  . Chlorhexidine Gluconate Cloth  6 each Topical Q0600  . cinacalcet  90 mg Oral QPM  . dicyclomine  10 mg Oral TID AC & HS  . gabapentin  300 mg Oral QPM  . hydrALAZINE  100 mg Oral TID  . lanthanum  1,000 mg Oral TID WC  . lipase/protease/amylase  12,000 Units  Oral TID AC  . lisinopril  5 mg Oral Daily  . metoCLOPramide (REGLAN) injection  5 mg Intravenous Q6H  . multivitamin  1 tablet Oral QHS  . pantoprazole  40 mg Oral Daily  . polyethylene glycol  17 g Oral BID  . senna-docusate  2 tablet Oral BID  . zolpidem  5 mg Oral QHS   Continuous Infusions: PRN Meds:.ALPRAZolam, dextrose, diclofenac sodium, hydrALAZINE, hydrOXYzine, hydrOXYzine, morphine injection, nitroGLYCERIN, ondansetron (ZOFRAN) IV, oxyCODONE    Anti-infectives (From admission, onward)   None        Objective:   Vitals:   05/12/18 1831 05/12/18 2150 05/13/18 0705 05/13/18 0844  BP: (!) 94/54 130/84 (!) 99/56 106/73  Pulse: 79 75 75 77  Resp:  _0 Temp: 98.6 F (37 C) 98.3 F (36.8 C) 98.2 F (36.8 C) 98 F (36.7 C)  TempSrc: Oral Oral Oral Oral  SpO2: 94% 98% 95% 95%  Weight:      Height:        Wt Readings from Last 3 Encounters:  05/12/18 76.7 kg  05/08/18 74.8 kg  04/27/18 76.1 kg     Intake/Output Summary (Last 24 hours) at 05/13/2018 1639 Last data filed at 05/13/2018 1400 Gross per 24 hour  Intake 340 ml  Output 0 ml  Net 340 ml     Physical Exam Frank [name] is examined daily including today on 05/13/18 , exams remain the same as of yesterday except that has changed   Gen:- Awake Alert, grumpy HEENT:- [place].AT, No sclera icterus Neck-Supple Neck,No JVD,.  Lungs-  CTAB , fairly symmetrical air movement CV- S1, S2 normal, regular Abd-  +ve B.Sounds, Abd Soft, complains of epigastric discomfort without rebound or guarding Extremity/Skin:-Left arm AV fistula with positive thrill and bruit , skin is excessively dry and scaly  psych-affect is unhappy/grumpy oriented x3 Neuro-no new focal deficits, no tremors   Data Review:   Micro Results Recent Results (from the past 240 hour(s))  MRSA PCR Screening     Status: None   Collection Time: 05/10/18  4:21 AM  Result Value Ref Range Status   MRSA by PCR NEGATIVE NEGATIVE Final    Comment:         The GeneXpert MRSA Assay (FDA approved for NASAL specimens only), is one component of a comprehensive MRSA colonization surveillance program. It is not intended to diagnose MRSA infection nor to guide or monitor treatment for MRSA infections. Performed at [facility], [place] [address] [place], [place] [address]     Radiology Reports Dg Chest 2 View  Result Date: 04/25/2018 CLINICAL DATA:  Back pain. EXAM: CHEST - 2 VIEW COMPARISON:  April 08, 2018 FINDINGS: There is a moderate right-sided pleural effusion with underlying opacity. Stable cardiomegaly. Mild pulmonary venous congestion without overt edema. No other acute abnormalities. IMPRESSION: Cardiomegaly, right moderate pleural effusion, and pulmonary venous congestion. Electronically Signed   By: Dorise Bullion III M.D   On: 04/25/2018 02:48  Ct Abdomen Pelvis W Contrast  Result Date: 05/09/2018 CLINICAL DATA:  Acute generalized abdominal pain. EXAM: CT ABDOMEN AND PELVIS WITH CONTRAST TECHNIQUE: Multidetector CT imaging of the abdomen and pelvis was performed using the standard protocol following bolus administration of intravenous contrast. CONTRAST:  123m OMNIPAQUE IOHEXOL 300 MG/ML  SOLN COMPARISON:  CT scan of February 04, 2018. FINDINGS: Lower chest: Large right pleural effusion is noted with adjacent atelectasis. Hepatobiliary: No focal liver abnormality is seen. No gallstones, gallbladder wall thickening, or biliary dilatation. Pancreas: Unremarkable. No pancreatic ductal dilatation or surrounding inflammatory changes. Spleen: Normal in size without focal abnormality. Adrenals/Urinary Tract: Adrenal glands appear normal. Bilateral renal atrophy is noted consistent with history of end-stage renal disease. Stable 3.1 cm solid mass arising from upper pole of left kidney. Urinary bladder is decompressed. Stomach/Bowel: The stomach appears normal. There is no evidence of bowel obstruction or inflammation. The appendix is  not visualized. Vascular/Lymphatic: Aortic atherosclerosis. No enlarged abdominal or pelvic lymph nodes. Reproductive: Prostate is unremarkable. Other: Failed renal transplant is noted in left lower quadrant. No hernia or ascites is noted. Musculoskeletal: Increased diffuse sclerosis is noted consistent with renal osteodystrophy. IMPRESSION: Large right pleural effusion with adjacent atelectasis of right lower lobe. Bilateral renal atrophy is noted consistent with end-stage renal disease. Stable 3.1 cm solid mass arising from upper pole of left kidney. Possible neoplasm cannot be excluded. Failed renal transplant seen in left lower quadrant. Aortic Atherosclerosis (ICD10-I70.0). Electronically Signed   By: JMarijo Conception M.D.   On: 05/09/2018 20:34   Dg Abd Acute W/chest  Result Date: 05/10/2018 CLINICAL DATA:  Upper abdominal pain, nausea vomiting. EXAM: DG ABDOMEN ACUTE W/ 1V CHEST COMPARISON:  Body CT 05/09/2018 FINDINGS: Large right pleural effusion with right lower lobe atelectasis versus airspace consolidation. Prominence of the vascular markings bilaterally. Enlarged cardiac silhouette. Calcific atherosclerotic disease of the aorta. Nonobstructive nonspecific bowel gas pattern with relative paucity of small bowel gas and preserved colonic bowel gas pattern. IMPRESSION: Nonobstructive nonspecific bowel gas pattern. Large right pleural effusion with pulmonary vascular congestion. Enlarged cardiac silhouette. Electronically Signed   By: DFidela SalisburyM.D.   On: 05/10/2018 16:28   Dg Foot Complete Right  Result Date: 04/25/2018 CLINICAL DATA:  52y/o M; injury to right foot with pain along the dorsal side. EXAM: RIGHT FOOT COMPLETE - 3+ VIEW COMPARISON:  None. FINDINGS: There is no evidence of fracture or dislocation. There is no evidence of arthropathy or other focal bone abnormality. Vascular calcifications noted. IMPRESSION: No acute bony or articular abnormality identified. Electronically  Signed   By: LKristine GarbeM.D.   On: 04/25/2018 02:51     CBC Recent Labs  Lab 05/08/18 1918 05/09/18 1218 05/10/18 0644 05/11/18 0610 05/12/18 1230  WBC 6.2 5.5 7.1 6.4 6.8  HGB 8.0* 9.0* 7.9* 7.7* 8.0*  HCT 25.8* 31.2* 25.4* 24.9* 26.1*  PLT 231 208 208 197 170  MCV 88.1 92.0 85.2 86.8 87.3  MCH 27.3 26.5 26.5 26.8 26.8  MCHC 31.0 28.8* 31.1 30.9 30.7  RDW 18.5* 18.6* 18.1* 17.9* 18.4*  LYMPHSABS  --   --   --   --  0.4*  MONOABS  --   --   --   --  0.6  EOSABS  --   --   --   --  0.2  BASOSABS  --   --   --   --  0.0    Chemistries  Recent Labs  Lab 05/08/18 1918 05/09/18  1218 05/10/18 0644 05/11/18 0810 05/12/18 1330  NA 137 137 135 134* 134*  K 4.4 5.0 5.4* 5.3* 4.8  CL 99 100 99 101 101  CO2 25 21* 20* 20* 23  GLUCOSE 81 65* 68* 105* 125*  BUN 49* 56* 64* 72* 38*  CREATININE 10.51* 11.99* 13.18* 14.03* 8.92*  CALCIUM 9.1 9.1 8.7* 8.4* 8.6*  MG  --   --   --  1.9  --   AST 19 30  --   --   --   ALT 12 12  --   --   --   ALKPHOS 126 119  --   --   --   BILITOT 0.6 0.9  --   --   --    ------------------------------------------------------------------------------------------------------------------ No results for input(s): CHOL, HDL, LDLCALC, TRIG, CHOLHDL, LDLDIRECT in the last 72 hours.  Lab Results  Component Value Date   HGBA1C 5.2 04/21/2017   ------------------------------------------------------------------------------------------------------------------ No results for input(s): TSH, T4TOTAL, T3FREE, THYROIDAB in the last 72 hours.  Invalid input(s): FREET3 ------------------------------------------------------------------------------------------------------------------ No results for input(s): VITAMINB12, FOLATE, FERRITIN, TIBC, IRON, RETICCTPCT in the last 72 hours.  Coagulation profile No results for input(s): INR, PROTIME in the last 168 hours.  No results for input(s): DDIMER in the last 72 hours.  Cardiac Enzymes No  results for input(s): CKMB, TROPONINI, MYOGLOBIN in the last 168 hours.  Invalid input(s): CK ------------------------------------------------------------------------------------------------------------------    Component Value Date/Time   BNP >4,500.0 (H) 02/09/2018 1049     Roxan Hockey M.D on 05/13/2018 at 4:39 PM  Pager---[phone] Go to [email or web address] - password [email or web address] for contact info  [facility] - Office  [phone]

## 2018-05-13 NOTE — Care Management Note (Signed)
Case Management Note  Patient Details  Name: KEANAN MELANDER MRN: [id number] Date of Birth: 1965/12/28  Subjective/Objective:     abd pain, chronic pancreatitis, ESRD-HD               Action/Plan:  NCM spoke to pt and he is interested in SNF for Rehab. Notified attending. Will need PT/OT eval. CSW referral for SNF.    Expected Discharge Date:                Expected Discharge Plan:  Skilled Nursing Facility  In-House Referral:  Clinical Social Work  Discharge planning Services  CM Consult  Post Acute Care Choice:  NA Choice offered to:  NA  DME Arranged:  N/A DME Agency:  NA  HH Arranged:    [place] Agency:     Status of Service:  In process, will continue to follow  If discussed at Long Length of Stay Meetings, dates discussed:    Additional Comments:  Erenest Rasher, RN 05/13/2018, 4:38 PM

## 2018-05-13 NOTE — Care Management Important Message (Signed)
Important Message  Patient Details  Name: Frank [name] MRN: [id number] Date of Birth: 04/12/1966   Medicare Important Message Given:  Yes    [name] 05/13/2018, 2:02 PM

## 2018-05-14 DIAGNOSIS — E162 Hypoglycemia, unspecified: Secondary | ICD-10-CM | POA: Diagnosis not present

## 2018-05-14 DIAGNOSIS — K861 Other chronic pancreatitis: Secondary | ICD-10-CM | POA: Diagnosis not present

## 2018-05-14 DIAGNOSIS — N186 End stage renal disease: Secondary | ICD-10-CM | POA: Diagnosis not present

## 2018-05-14 DIAGNOSIS — Z992 Dependence on renal dialysis: Secondary | ICD-10-CM | POA: Diagnosis not present

## 2018-05-14 DIAGNOSIS — R109 Unspecified abdominal pain: Secondary | ICD-10-CM | POA: Diagnosis not present

## 2018-05-14 DIAGNOSIS — D631 Anemia in chronic kidney disease: Secondary | ICD-10-CM | POA: Diagnosis not present

## 2018-05-14 DIAGNOSIS — R1084 Generalized abdominal pain: Secondary | ICD-10-CM | POA: Diagnosis not present

## 2018-05-14 DIAGNOSIS — I120 Hypertensive chronic kidney disease with stage 5 chronic kidney disease or end stage renal disease: Secondary | ICD-10-CM | POA: Diagnosis not present

## 2018-05-14 LAB — GLUCOSE, CAPILLARY
GLUCOSE-CAPILLARY: 74 mg/dL (ref 70–99)
GLUCOSE-CAPILLARY: 112 mg/dL — AB (ref 70–99)
GLUCOSE-CAPILLARY: 138 mg/dL — AB (ref 70–99)
Glucose-Capillary: 77 mg/dL (ref 70–99)
Glucose-Capillary: 75 mg/dL (ref 70–99)

## 2018-05-14 MED ORDER — ZOLPIDEM TARTRATE 5 MG PO TABS
5.0000 mg | ORAL_TABLET | Freq: Every evening | ORAL | Status: DC | PRN
  Administered 2018-05-14: 5 mg via ORAL
  Filled 2018-05-14: qty 1

## 2018-05-14 MED ORDER — MORPHINE SULFATE (PF) 2 MG/ML IV SOLN
2.0000 mg | INTRAVENOUS | Status: DC | PRN
  Administered 2018-05-14: 2 mg via INTRAVENOUS
  Filled 2018-05-14: qty 1

## 2018-05-14 MED ORDER — OXYCODONE HCL 5 MG PO TABS
5.0000 mg | ORAL_TABLET | ORAL | Status: DC | PRN
  Administered 2018-05-14: 5 mg via ORAL
  Filled 2018-05-14 (×2): qty 1

## 2018-05-14 MED ORDER — DARBEPOETIN ALFA 200 MCG/0.4ML IJ SOSY
200.0000 ug | PREFILLED_SYRINGE | INTRAMUSCULAR | Status: DC
  Filled 2018-05-14: qty 0.4

## 2018-05-14 MED ORDER — DOXERCALCIFEROL 4 MCG/2ML IV SOLN
5.0000 ug | INTRAVENOUS | Status: DC
  Filled 2018-05-14: qty 4

## 2018-05-14 NOTE — Progress Notes (Addendum)
[facility] Progress Note   Subjective: Pleasant, no C/Os.   Objective Vitals:   05/14/18 0020 05/14/18 0049 05/14/18 0112 05/14/18 0840  BP: (!) 106/55 115/67 108/65 (!) 111/94  Pulse: 66 68 69 75  Resp: 13 (!) _0 Temp:  97.6 F (36.4 C)  98.1 F (36.7 C)  TempSrc:  Tympanic  Oral  SpO2: 96% 96% 99% 93%  Weight:  76 kg    Height:       Physical Exam General: Chronically ill appearing male in NAD Heart: S1,S2, RRR Lungs: CTAB Abdomen: Slightly distended, some tenderness upper quads Extremities: No LE edema.  Dialysis Access: L AVF + bruit    Additional Objective Labs: Basic Metabolic Panel: Recent Labs  Lab 05/11/18 0810 05/12/18 1330 05/13/18 2040  NA 134* 134* 135  K 5.3* 4.8 4.2  CL 101 101 101  CO2 20* 23 26  GLUCOSE 105* 125* 80  BUN 72* 38* 28*  CREATININE 14.03* 8.92* 7.65*  CALCIUM 8.4* 8.6* 7.7*  PHOS 5.7* 4.2 3.3   Liver Function Tests: Recent Labs  Lab 05/08/18 1918 05/09/18 1218 05/11/18 0810 05/12/18 1330 05/13/18 2040  AST 19 30  --   --   --   ALT 12 12  --   --   --   ALKPHOS 126 119  --   --   --   BILITOT 0.6 0.9  --   --   --   PROT 7.4 7.8  --   --   --   ALBUMIN 2.6* 2.9* 2.5* 2.5* 2.4*   Recent Labs  Lab 05/08/18 1918 05/09/18 1218  LIPASE 93* 48   CBC: Recent Labs  Lab 05/09/18 1218 05/10/18 0644 05/11/18 0610 05/12/18 1230 05/13/18 2040  WBC 5.5 7.1 6.4 6.8 4.8  NEUTROABS  --   --   --  5.5  --   HGB 9.0* 7.9* 7.7* 8.0* 7.9*  HCT 31.2* 25.4* 24.9* 26.1* 26.9*  MCV 92.0 85.2 86.8 87.3 88.2  PLT 208 208 197 170 180   Blood Culture    Component Value Date/Time   SDES PLEURAL RIGHT 02/01/2018 0922   SDES PLEURAL RIGHT 02/01/2018 0922   SPECREQUEST NONE 02/01/2018 0922   SPECREQUEST NONE 02/01/2018 0922   CULT  02/01/2018 0922    NO GROWTH 5 DAYS Performed at [facility], [name] [address] [place], [place] [address]    REPTSTATUS 02/06/2018 FINAL 02/01/2018 0922   REPTSTATUS  02/02/2018 FINAL 02/01/2018 0922    Cardiac Enzymes: No results for input(s): CKTOTAL, CKMB, CKMBINDEX, TROPONINI in the last 168 hours. CBG: Recent Labs  Lab 05/13/18 0845 05/13/18 1153 05/13/18 1748 05/14/18 0111 05/14/18 0838  GLUCAP 86 88 132* 74 112*   Iron Studies: No results for input(s): IRON, TIBC, TRANSFERRIN, FERRITIN in the last 72 hours. _1 @ Studies/Results: No results found. Medications:  . amLODipine  5 mg Oral Daily  . apixaban  5 mg Oral BID  . busPIRone  5 mg Oral BID  . carvedilol  25 mg Oral BID WC  . Chlorhexidine Gluconate Cloth  6 each Topical Q0600  . cinacalcet  90 mg Oral QPM  . dicyclomine  10 mg Oral TID AC & HS  . gabapentin  300 mg Oral QPM  . hydrALAZINE  100 mg Oral TID  . lanthanum  1,000 mg Oral TID WC  . lipase/protease/amylase  12,000 Units Oral TID AC  . lisinopril  5 mg Oral Daily  .  metoCLOPramide (REGLAN) injection  5 mg Intravenous Q6H  . multivitamin  1 tablet Oral QHS  . pantoprazole  40 mg Oral Daily  . polyethylene glycol  17 g Oral BID  . senna-docusate  2 tablet Oral BID  . zolpidem  5 mg Oral QHS   Dialysis:[facility] 4h 75kg 2/2.25 bath LFA AVF Hep none - mircera 225 last 10/14 - hect 5 ug  Assessment/Plan: 1. Chronic pancreatitis with chronic pain syndrome/Opiod addiction. Per primary 2. ESRD -MWF next HD 05/14/18 K+ 4.2 3. Anemia - HGB 7.9. No recent ESA. Give Aranesp 200 mcg IV with HD tomorrow.  4. Secondary hyperparathyroidism - Phos 3.3 Ca 7.7 C Ca 9.0. Continue binders, VDRA.  5. HTN/volume - HD yesterday. Told HD nurse wrong OP EDW however volume seems OK. Net UF 2.8 Post wt 76 kg. BP soft, probably OK to raise to 76 kg.  6. Nutrition - Albumin 2.4 Renal diet. Add prostat, renal vit.   Rita H. Brown NP-C 05/14/2018, 9:15 AM  [employer] [phone]  I have seen and examined this patient and agree with plan and assessment in the above note with renal  recommendations/intervention highlighted.  [name] A Siera [place],MD 05/14/2018 12:45 PM

## 2018-05-14 NOTE — NC FL2 (Signed)
[place] LEVEL OF CARE SCREENING TOOL     IDENTIFICATION  Patient Name: Frank [name] Birthdate: September 22, 1965 Sex: male Admission Date (Current Location): 05/09/2018  [facility] and [place] Number:  Herbalist and Address:  [facility] [place], [place] [address] [place], [place] [address]      Provider Number: [id number]  Attending Physician Name and Address:  Roxan Hockey, MD  Relative Name and Phone Number:  SisterModena Morrow [phone]    Current Level of Care: Hospital Recommended Level of Care: [place] Prior Approval Number:    Date Approved/Denied:   PASRR Number: [id number]  Discharge Plan: SNF    Current Diagnoses: Patient Active Problem List   Diagnosis Date Noted  . Chronic pancreatitis ([place]) 05/09/2018  . Hypoglycemia 05/09/2018  . Uremia 04/25/2018  . Foot pain, right 04/25/2018  . Dialysis patient, noncompliant ([place]) 03/05/2018  . Palliative care by specialist   . DNR (do not resuscitate) discussion   . Superficial venous thrombosis of arm, right 02/14/2018  . Hypoxemia 01/31/2018  . Pleural effusion, right 01/31/2018  . Hyperkalemia 01/25/2018  . Elevated troponin 01/16/2018  . SBO (small bowel obstruction) ([place]) 01/16/2018  . PE (pulmonary thromboembolism) ([place]) 01/16/2018  . Benign hypertensive heart and kidney disease with systolic CHF, NYHA class 3 and CKD stage 5 ([place])   . Hypervolemia associated with renal insufficiency   . End-stage renal disease on hemodialysis ([place])   . Right upper quadrant abdominal pain 12/01/2017  . Junctional bradycardia   . Pleuritic chest pain 11/09/2017  . Respiratory failure ([place]) 09/21/2017  . Cirrhosis ([place])   . Pancreatic pseudocyst   . Acute on chronic pancreatitis ([place]) 08/09/2017  . Hypoalbuminemia 08/09/2017  . Suspected renal osteodystrophy 08/09/2017  . Abdominal mass, left upper quadrant 08/09/2017  . Chronic left shoulder pain 08/09/2017   . Acute dyspnea 07/21/2017  . Recurrent deep venous thrombosis ([place]) 04/27/2017  . Marijuana abuse 04/21/2017  . DVT (deep venous thrombosis) ([place]) 03/11/2017  . Aortic atherosclerosis ([place]) 01/05/2017  . Epigastric pain 08/04/2016  . GERD (gastroesophageal reflux disease) 05/29/2016  . Nonischemic cardiomyopathy ([place]) 01/09/2016  . Abdominal pain   . Bilateral low back pain without sciatica   . Left renal mass 10/30/2015  . Constipation by delayed colonic transit 10/30/2015  . Chronic combined systolic and diastolic CHF (congestive heart failure) ([place]) 09/23/2015  . Chest pain 09/08/2015  . Adjustment disorder with mixed anxiety and depressed mood 08/20/2015  . Essential hypertension 01/02/2015  . Dyslipidemia   . DM (diabetes mellitus), type 2, uncontrolled, with renal complications ([place])   . Complex sleep apnea syndrome 05/05/2014  . Anemia of chronic kidney failure 06/24/2013  . Nausea vomiting and diarrhea 06/24/2013    Orientation RESPIRATION BLADDER Height & Weight     Time, Self, Situation, Place  Normal Continent Weight: 167 lb 8.8 oz (76 kg) Height:  _0  (188 cm)  BEHAVIORAL SYMPTOMS/MOOD NEUROLOGICAL BOWEL NUTRITION STATUS      Continent Diet(Renal diet; Fluid restriction 1248m)  AMBULATORY STATUS COMMUNICATION OF NEEDS Skin   Limited Assist(Min guard; Min assist) Verbally Normal                       Personal Care Assistance Level of Assistance  Bathing, Feeding, Dressing Bathing Assistance: Limited assistance(Set up upper; Min assist lower) Feeding assistance: Independent(Modified) Dressing Assistance: Limited assistance(Set up upper; Min assist lower)     Functional Limitations Info  Sight, Hearing, Speech Sight Info: Adequate Hearing Info: Adequate Speech Info: Adequate    SPECIAL CARE FACTORS FREQUENCY  PT (By licensed PT), OT (By licensed OT)     PT Frequency: Evaluated 05/14/2018. Minimum of 2x per week during acute impatient stay. OT  Frequency: Evaluated 05/14/2018. Minimum of 2x per week during acute impatient stay.            Contractures Contractures Info: Not present    Additional Factors Info  Code Status, Allergies Code Status Info: Full code Allergies Info: Butalbital-apap-caffeine, Ferrlecit (Na Ferric Gluc Cplx In Sucrose), Minoxidil, Tylenol (Acetaminophen), Darvocet (Propoxyphene N-acetaminophen)           Current Medications (05/14/2018):  This is the current hospital active medication list Current Facility-Administered Medications  Medication Dose Route Frequency Provider Last Rate Last Dose  . ALPRAZolam [name]) tablet 0.25 mg  0.25 mg Oral BID PRN Ivor Costa, MD   0.25 mg at 05/10/18 1103  . amLODipine (NORVASC) tablet 5 mg  5 mg Oral Daily Ivor Costa, MD   5 mg at 05/14/18 4193  . apixaban (ELIQUIS) tablet 5 mg  5 mg Oral BID Ivor Costa, MD   5 mg at 05/14/18 7902  . busPIRone (BUSPAR) tablet 5 mg  5 mg Oral BID Ivor Costa, MD   5 mg at 05/14/18 1204  . carvedilol (COREG) tablet 25 mg  25 mg Oral BID WC Ivor Costa, MD   25 mg at 05/14/18 0903  . Chlorhexidine Gluconate Cloth 2 % PADS 6 each  6 each Topical Q0600 Roney Jaffe, MD      . cinacalcet (SENSIPAR) tablet 90 mg  90 mg Oral QPM Ivor Costa, MD   90 mg at 05/13/18 1805  . [START ON 05/15/2018] Darbepoetin Alfa (ARANESP) injection 200 mcg  200 mcg Intravenous Q Fri-HD Valentina Gu, NP      . dextrose 50 % solution 50 mL  50 mL Intravenous PRN Ivor Costa, MD   50 mL at 05/10/18 0409  . diclofenac sodium (VOLTAREN) 1 % transdermal gel 2 g  2 g Topical QID PRN Ivor Costa, MD      . dicyclomine (BENTYL) 10 MG/5ML syrup 10 mg  10 mg Oral TID AC & HS Ivor Costa, MD   10 mg at 05/14/18 1203  . [START ON 05/15/2018] doxercalciferol (HECTOROL) injection 5 mcg  5 mcg Intravenous Q M,W,F-HD Valentina Gu, NP      . gabapentin (NEURONTIN) capsule 300 mg  300 mg Oral QPM Ivor Costa, MD   300 mg at 05/13/18 1805  . hydrALAZINE  (APRESOLINE) injection 5 mg  5 mg Intravenous Q2H PRN Ivor Costa, MD   5 mg at 05/09/18 2356  . hydrALAZINE (APRESOLINE) tablet 100 mg  100 mg Oral TID Ivor Costa, MD   100 mg at 05/14/18 0902  . hydrOXYzine (ATARAX/VISTARIL) tablet 10 mg  10 mg Oral TID PRN Ivor Costa, MD      . hydrOXYzine (VISTARIL) injection 25 mg  25 mg Intramuscular Q6H PRN Ivor Costa, MD      . lanthanum [facility]) chewable tablet 1,000 mg  1,000 mg Oral TID WC Ivor Costa, MD   1,000 mg at 05/14/18 1203  . lipase/protease/amylase (CREON) capsule 12,000 Units  12,000 Units Oral TID Renella Cunas, MD   12,000 Units at 05/14/18 1203  . lisinopril (PRINIVIL,ZESTRIL) tablet 5 mg  5 mg Oral Daily Ivor Costa, MD   5 mg at 05/14/18 4097  . metoCLOPramide (  REGLAN) injection 5 mg  5 mg Intravenous Q6H Lady Deutscher, MD   5 mg at 05/14/18 1204  . multivitamin (RENA-VIT) tablet 1 tablet  1 tablet Oral QHS Ivor Costa, MD   1 tablet at 05/11/18 2225  . nitroGLYCERIN (NITROSTAT) SL tablet 0.4 mg  0.4 mg Sublingual Q5 min PRN Ivor Costa, MD      . ondansetron [facility]) injection 4 mg  4 mg Intravenous Q6H PRN Schorr, Rhetta Mura, NP   4 mg at 05/13/18 1611  . oxyCODONE (Oxy IR/ROXICODONE) immediate release tablet 5 mg  5 mg Oral Q4H PRN Denton Brick, Courage, MD   5 mg at 05/14/18 1348  . pantoprazole (PROTONIX) EC tablet 40 mg  40 mg Oral Daily Ivor Costa, MD   40 mg at 05/14/18 0903  . polyethylene glycol (MIRALAX / GLYCOLAX) packet 17 g  17 g Oral BID Alma Friendly, MD      . senna-docusate (Senokot-S) tablet 2 tablet  2 tablet Oral BID Alma Friendly, MD   2 tablet at 05/14/18 0902     Discharge Medications: Please see discharge summary for a list of discharge medications.  Relevant Imaging Results:  Relevant Lab Results:   Additional Information [id number]; Dialysis: [place] MWF  Fermin Schwab, [place] Work [phone]

## 2018-05-14 NOTE — Evaluation (Signed)
Physical Therapy Evaluation Patient Details Name: Frank [name] MRN: [id number] DOB: [birth year] Today's Date: 05/14/2018   History of Present Illness  52 y.o. male with medical history significant of hypertension, GERD, depression with anxiety, chronic pancreatitis, CHF with EF of 35%, ESRD-HD (Monday Wednesday Friday), DVT/PE on Eliquis, small bowel obstruction, anemia and chronic pain syndrome with chronic opiate use admitted on 05/09/2018 with nausea vomiting diarrhea and abdominal pain after missing 2 hemodialysis sessions prior Wednesday and Friday prior to admission.  History of noncompliance  Clinical Impression  Pt admitted with above diagnosis. Pt currently with functional limitations due to the deficits listed below (see PT Problem List). Pt ambulated 40' with RW, distance limited by fatigue. Pt lives alone and reports he does not have assistance available from friends/family. ST-SNF recommended as mobility is significantly limited.   Pt will benefit from skilled PT to increase their independence and safety with mobility to allow discharge to the venue listed below.        Follow Up Recommendations SNF;Supervision for mobility/OOB    Equipment Recommendations  None recommended by PT    Recommendations for Other Services       Precautions / Restrictions Precautions Precautions: Fall Precaution Comments: pt denies h/o falls Restrictions Weight Bearing Restrictions: No      Mobility  Bed Mobility Overal bed mobility: Modified Independent             General bed mobility comments: HOB up 40*, increased time  Transfers Overall transfer level: Needs assistance Equipment used: Rolling walker (2 wheeled) Transfers: Sit to/from Stand Sit to Stand: Min guard;From elevated surface         General transfer comment: from significantly elevated bed  Ambulation/Gait Ambulation/Gait assistance: Min guard;Min assist Gait Distance (Feet): 40 Feet Assistive device:  Rolling walker (2 wheeled) Gait Pattern/deviations: Step-through pattern;Decreased stride length Gait velocity: decr   General Gait Details: 2 standing rest breaks 2* fatigue, distance limited by fatigue, no loss of balance  Stairs            Wheelchair Mobility    Modified Rankin (Stroke Patients Only)       Balance Overall balance assessment: Needs assistance   Sitting balance-Leahy Scale: Good       Standing balance-Leahy Scale: Fair                               Pertinent Vitals/Pain Pain Assessment: (pt reported abdominal pain, shrugged his shoulders when asked to rate it, pt had morphine at 8:59)    Home Living Family/patient expects to be discharged to:: Private residence Living Arrangements: Alone   Type of Home: Apartment Home Access: Stairs to enter   [employer] of Steps: 4 Home Layout: One level Home Equipment: [place] - 2 wheels;Shower seat;Cane - single point      Prior Function Level of Independence: Independent with assistive device(s)         Comments: walks with SPC     Hand Dominance   Dominant Hand: Right    Extremity/Trunk Assessment   Upper Extremity Assessment Upper Extremity Assessment: Generalized weakness    Lower Extremity Assessment Lower Extremity Assessment: Generalized weakness(knee ext AROM -15* B )    Cervical / Trunk Assessment Cervical / Trunk Assessment: Normal  Communication   Communication: No difficulties  Cognition Arousal/Alertness: Awake/alert Behavior During Therapy: WFL for tasks assessed/performed;Flat affect Overall Cognitive Status: Within Functional Limits for tasks assessed  General Comments      Exercises     Assessment/Plan    PT Assessment Patient needs continued PT services  PT Problem List Decreased range of motion;Decreased activity tolerance;Decreased mobility;Decreased balance       PT Treatment  Interventions Gait training;Therapeutic activities;Therapeutic exercise;Functional mobility training;Patient/family education    PT Goals (Current goals can be found in the Care Plan section)  Acute Rehab PT Goals Patient Stated Goal: to get strength back PT Goal Formulation: With patient Time For Goal Achievement: 05/28/18 Potential to Achieve Goals: Fair    Frequency Min 2X/week   Barriers to discharge        Co-evaluation               AM-PAC PT "6 Clicks" Daily Activity  Outcome Measure Difficulty turning over in bed (including adjusting bedclothes, sheets and blankets)?: A Little Difficulty moving from lying on back to sitting on the side of the bed? : A Little Difficulty sitting down on and standing up from a chair with arms (e.g., wheelchair, bedside commode, etc,.)?: Unable Help needed moving to and from a bed to chair (including a wheelchair)?: A Little Help needed walking in hospital room?: A Little Help needed climbing 3-5 steps with a railing? : A Lot 6 Click Score: 15    End of Session Equipment Utilized During Treatment: Gait belt Activity Tolerance: Patient limited by fatigue Patient left: in bed;with nursing/sitter in room;with call bell/phone within reach Nurse Communication: Mobility status PT Visit Diagnosis: Difficulty in walking, not elsewhere classified (R26.2);Pain    Time: 5597-4163 PT Time Calculation (min) (ACUTE ONLY): 14 min   Charges:   PT Evaluation $PT Eval Low Complexity: 1 Low          Blondell Reveal Kistler PT 05/14/2018  Acute Rehabilitation Services Pager [phone] Office [phone]

## 2018-05-14 NOTE — Progress Notes (Signed)
HD tx completed @ 0020 w/o problem UF goal met, the goal that the pt made for himself was met, pt stated at beginning of tx that he didn't have the 3-4L on him that was ordered for removal, he stated his EDW was 31 and wanted to get to his EDW which would be 2.8L Blood rinsed back VSS Report called to Tawni Carnes, RN

## 2018-05-14 NOTE — Progress Notes (Signed)
Patient Demographics:    Frank [name], is a 52 y.o. male, DOB - Nov 21, 1965, GMW:[id number]  Admit date - 05/09/2018   Admitting Physician Ivor Costa, MD  Outpatient Primary MD for the patient is Patient, [name]  LOS - 4   Chief Complaint  Patient presents with  . Abdominal Pain        Subjective:    Frank [name] today has no fevers,   No chest pain, patient complains of generalized weakness, wondering if he needs physical therapy and SNF, complains of emesis with attempts to eat, no further diarrhea  Assessment  & Plan :    Principal Problem:   Abdominal pain Active Problems:   Anemia of chronic kidney failure   Nausea vomiting and diarrhea   Essential hypertension   Adjustment disorder with mixed anxiety and depressed mood   Chronic combined systolic and diastolic CHF (congestive heart failure) (HCC)   GERD (gastroesophageal reflux disease)   DVT (deep venous thrombosis) (HCC)   End-stage renal disease on hemodialysis (HCC)   PE (pulmonary thromboembolism) ([place])   Chronic pancreatitis ([place])   Hypoglycemia  Brief summary 52 y.o. male with medical history significant of hypertension, GERD, depression with anxiety, chronic pancreatitis, CHF with EF of 35%, ESRD-HD (Monday Wednesday Friday), DVT/PE on Eliquis, small bowel obstruction, anemia and chronic pain syndrome with chronic opiate use admitted on 05/09/2018 with nausea vomiting diarrhea and abdominal pain after missing 2 hemodialysis sessions prior Wednesday and Friday prior to admission.  History of noncompliance. Awaiting SNF Rehab  Plan:- 1)Chronic pancreatitis with chronic pain syndrome and opioid addiction----be judicious with opiates as this may contribute further to his GI problems, patient has a history of intermittent emesis abdominal discomfort, He declines further abdominal imaging, use PRN Zofran and Reglan, c/n [facility]  2)ESRD---  typically HD on M/W/F, last HD 05/13/2018   3)History of noncompliance--patient is notoriously noncompliant with hemodialysis sessions, lifestyle advise,  diet and medication regimen  4) chronic anemia of CKD--- Hgb remains around 8 , Aranesp/EPO per nephrology team, hemoglobin currently around 8, anemia panel: iron 34, sats 22%, ferritin 301  5)H/o DVT/PE--- stable, continue Eliquis 5 mg twice daily,   6)HTN-stable, continue Coreg 25 mg twice daily, amlodipine 5 mg daily, hydralazine 100 mg 3 times daily, and lisinopril 5 mg daily  7)HFrEF/history of combined diastolic and systolic dysfunction CHF with EF and 35% range, volume status being addressed with hemodialysis, appears compensated, EDW maybe 76 kg  8)Recurrent Hypoglycemia-- in the setting of poor oral intake and emesis, small frequent meals advised, antiemetics as prescribed, overall glycemic control is better  9)Depression/Anxiety-denies suicidal homicidal ideation or plan, continue buspirone 5 mg twice daily, continue hydroxyzine 10 mg 3 times daily as needed for anxiety, continue Xanax 0.5 mg twice daily as needed anxiety and insomnia  10)Generalized weakness/Debility----patient is having difficulty with mobility related activities of daily living, PT recommends SNF, social work consult requested for SNF placement   Disposition/Need for in-Hospital Stay- patient unable to be discharged at this time due to  need for SNF rehab, awaiting insurance approval for SNF.   Code Status : Full   Disposition Plan  : ???SNF  Consults  :  Nephrology  DVT Prophylaxis  :  Eliquis  Lab Results  Component Value Date   PLT 180 05/13/2018    Inpatient Medications  Scheduled Meds: . amLODipine  5 mg Oral Daily  . apixaban  5 mg Oral BID  . busPIRone  5 mg Oral BID  . carvedilol  25 mg Oral BID WC  . Chlorhexidine Gluconate Cloth  6 each Topical Q0600  . cinacalcet  90 mg Oral QPM  . [START ON 05/15/2018] darbepoetin (ARANESP)  injection - DIALYSIS  200 mcg Intravenous Q Fri-HD  . dicyclomine  10 mg Oral TID AC & HS  . [START ON 05/15/2018] doxercalciferol  5 mcg Intravenous Q M,W,F-HD  . gabapentin  300 mg Oral QPM  . hydrALAZINE  100 mg Oral TID  . lanthanum  1,000 mg Oral TID WC  . lipase/protease/amylase  12,000 Units Oral TID AC  . lisinopril  5 mg Oral Daily  . metoCLOPramide (REGLAN) injection  5 mg Intravenous Q6H  . multivitamin  1 tablet Oral QHS  . pantoprazole  40 mg Oral Daily  . polyethylene glycol  17 g Oral BID  . senna-docusate  2 tablet Oral BID   Continuous Infusions: PRN Meds:.ALPRAZolam, dextrose, diclofenac sodium, hydrALAZINE, hydrOXYzine, hydrOXYzine, nitroGLYCERIN, ondansetron (ZOFRAN) IV, oxyCODONE    Anti-infectives (From admission, onward)   None        Objective:   Vitals:   05/14/18 0020 05/14/18 0049 05/14/18 0112 05/14/18 0840  BP: (!) 106/55 115/67 108/65 (!) 111/94  Pulse: 66 68 69 75  Resp: 13 (!) _0 Temp:  97.6 F (36.4 C)  98.1 F (36.7 C)  TempSrc:  Tympanic  Oral  SpO2: 96% 96% 99% 93%  Weight:  76 kg    Height:        Wt Readings from Last 3 Encounters:  05/14/18 76 kg  05/08/18 74.8 kg  04/27/18 76.1 kg     Intake/Output Summary (Last 24 hours) at 05/14/2018 1205 Last data filed at 05/14/2018 0900 Gross per 24 hour  Intake 900 ml  Output 2800 ml  Net -1900 ml     Physical Exam Patient is examined daily including today on 05/14/18 , exams remain the same as of yesterday except that has changed   Gen:- Awake Alert, in no apparent distress  HEENT:- [place].AT, No sclera icterus Neck-Supple Neck,No JVD,.  Lungs-  CTAB , fairly symmetrical air movement CV- S1, S2 normal, regular Abd-  +ve B.Sounds, Abd Soft, benign exam  extremity/Skin:-Left arm AV fistula with positive thrill and bruit , skin is excessively dry and scaly, no open wound psych-affect is flat, oriented x3 Neuro-no new focal deficits, no tremors   Data Review:   Micro  Results Recent Results (from the past 240 hour(s))  MRSA PCR Screening     Status: None   Collection Time: 05/10/18  4:21 AM  Result Value Ref Range Status   MRSA by PCR NEGATIVE NEGATIVE Final    Comment:        The GeneXpert MRSA Assay (FDA approved for NASAL specimens only), is one component of a comprehensive MRSA colonization surveillance program. It is not intended to diagnose MRSA infection nor to guide or monitor treatment for MRSA infections. Performed at [facility], [place] [address], [place], [place] [address]     Radiology Reports Dg Chest 2 View  Result Date: 04/25/2018 CLINICAL DATA:  Back pain. EXAM: CHEST - 2 VIEW COMPARISON:  April 08, 2018 FINDINGS: There is a moderate right-sided pleural effusion with underlying  opacity. Stable cardiomegaly. Mild pulmonary venous congestion without overt edema. No other acute abnormalities. IMPRESSION: Cardiomegaly, right moderate pleural effusion, and pulmonary venous congestion. Electronically Signed   By: Dorise Bullion III M.D   On: 04/25/2018 02:48   Ct Abdomen Pelvis W Contrast  Result Date: 05/09/2018 CLINICAL DATA:  Acute generalized abdominal pain. EXAM: CT ABDOMEN AND PELVIS WITH CONTRAST TECHNIQUE: Multidetector CT imaging of the abdomen and pelvis was performed using the standard protocol following bolus administration of intravenous contrast. CONTRAST:  166m OMNIPAQUE IOHEXOL 300 MG/ML  SOLN COMPARISON:  CT scan of February 04, 2018. FINDINGS: Lower chest: Large right pleural effusion is noted with adjacent atelectasis. Hepatobiliary: No focal liver abnormality is seen. No gallstones, gallbladder wall thickening, or biliary dilatation. Pancreas: Unremarkable. No pancreatic ductal dilatation or surrounding inflammatory changes. Spleen: Normal in size without focal abnormality. Adrenals/Urinary Tract: Adrenal glands appear normal. Bilateral renal atrophy is noted consistent with history of end-stage renal disease.  Stable 3.1 cm solid mass arising from upper pole of left kidney. Urinary bladder is decompressed. Stomach/Bowel: The stomach appears normal. There is no evidence of bowel obstruction or inflammation. The appendix is not visualized. Vascular/Lymphatic: Aortic atherosclerosis. No enlarged abdominal or pelvic lymph nodes. Reproductive: Prostate is unremarkable. Other: Failed renal transplant is noted in left lower quadrant. No hernia or ascites is noted. Musculoskeletal: Increased diffuse sclerosis is noted consistent with renal osteodystrophy. IMPRESSION: Large right pleural effusion with adjacent atelectasis of right lower lobe. Bilateral renal atrophy is noted consistent with end-stage renal disease. Stable 3.1 cm solid mass arising from upper pole of left kidney. Possible neoplasm cannot be excluded. Failed renal transplant seen in left lower quadrant. Aortic Atherosclerosis (ICD10-I70.0). Electronically Signed   By: JMarijo Conception M.D.   On: 05/09/2018 20:34   Dg Abd Acute W/chest  Result Date: 05/10/2018 CLINICAL DATA:  Upper abdominal pain, nausea vomiting. EXAM: DG ABDOMEN ACUTE W/ 1V CHEST COMPARISON:  Body CT 05/09/2018 FINDINGS: Large right pleural effusion with right lower lobe atelectasis versus airspace consolidation. Prominence of the vascular markings bilaterally. Enlarged cardiac silhouette. Calcific atherosclerotic disease of the aorta. Nonobstructive nonspecific bowel gas pattern with relative paucity of small bowel gas and preserved colonic bowel gas pattern. IMPRESSION: Nonobstructive nonspecific bowel gas pattern. Large right pleural effusion with pulmonary vascular congestion. Enlarged cardiac silhouette. Electronically Signed   By: DFidela SalisburyM.D.   On: 05/10/2018 16:28   Dg Foot Complete Right  Result Date: 04/25/2018 CLINICAL DATA:  52y/o M; injury to right foot with pain along the dorsal side. EXAM: RIGHT FOOT COMPLETE - 3+ VIEW COMPARISON:  None. FINDINGS: There is no  evidence of fracture or dislocation. There is no evidence of arthropathy or other focal bone abnormality. Vascular calcifications noted. IMPRESSION: No acute bony or articular abnormality identified. Electronically Signed   By: LKristine GarbeM.D.   On: 04/25/2018 02:51     CBC Recent Labs  Lab 05/09/18 1218 05/10/18 0644 05/11/18 0610 05/12/18 1230 05/13/18 2040  WBC 5.5 7.1 6.4 6.8 4.8  HGB 9.0* 7.9* 7.7* 8.0* 7.9*  HCT 31.2* 25.4* 24.9* 26.1* 26.9*  PLT 208 208 197 170 180  MCV 92.0 85.2 86.8 87.3 88.2  MCH 26.5 26.5 26.8 26.8 25.9*  MCHC 28.8* 31.1 30.9 30.7 29.4*  RDW 18.6* 18.1* 17.9* 18.4* 18.1*  LYMPHSABS  --   --   --  0.4*  --   MONOABS  --   --   --  0.6  --  EOSABS  --   --   --  0.2  --   BASOSABS  --   --   --  0.0  --     Chemistries  Recent Labs  Lab 05/08/18 1918 05/09/18 1218 05/10/18 0644 05/11/18 0810 05/12/18 1330 05/13/18 2040  NA 137 137 135 134* 134* 135  K 4.4 5.0 5.4* 5.3* 4.8 4.2  CL 99 100 99 101 101 101  CO2 25 21* 20* 20* 23 26  GLUCOSE 81 65* 68* 105* 125* 80  BUN 49* 56* 64* 72* 38* 28*  CREATININE 10.51* 11.99* 13.18* 14.03* 8.92* 7.65*  CALCIUM 9.1 9.1 8.7* 8.4* 8.6* 7.7*  MG  --   --   --  1.9  --   --   AST 19 30  --   --   --   --   ALT 12 12  --   --   --   --   ALKPHOS 126 119  --   --   --   --   BILITOT 0.6 0.9  --   --   --   --    ------------------------------------------------------------------------------------------------------------------ No results for input(s): CHOL, HDL, LDLCALC, TRIG, CHOLHDL, LDLDIRECT in the last 72 hours.  Lab Results  Component Value Date   HGBA1C 5.2 04/21/2017   ------------------------------------------------------------------------------------------------------------------ No results for input(s): TSH, T4TOTAL, T3FREE, THYROIDAB in the last 72 hours.  Invalid input(s):  FREET3 ------------------------------------------------------------------------------------------------------------------ No results for input(s): VITAMINB12, FOLATE, FERRITIN, TIBC, IRON, RETICCTPCT in the last 72 hours.  Coagulation profile No results for input(s): INR, PROTIME in the last 168 hours.  No results for input(s): DDIMER in the last 72 hours.  Cardiac Enzymes No results for input(s): CKMB, TROPONINI, MYOGLOBIN in the last 168 hours.  Invalid input(s): CK ------------------------------------------------------------------------------------------------------------------    Component Value Date/Time   BNP >4,500.0 (H) 02/09/2018 1049     Yoan Sallade M.D on 05/14/2018 at 12:05 PM  Pager---[phone] Go to [email or web address] - password [email or web address] for contact info  [facility] - Office  [phone]

## 2018-05-14 NOTE — Progress Notes (Signed)
Pt states oral pain medication not having any effects on his pain management and has requested IV pain medication.  RN has paged MD.  Awaiting page back.  No new orders at this time.

## 2018-05-14 NOTE — Evaluation (Signed)
Occupational Therapy Evaluation Patient Details Name: Frank [name] MRN: [id number] DOB: [birth year] Today's Date: 05/14/2018    History of Present Illness 52 y.o. male with medical history significant of hypertension, GERD, depression with anxiety, chronic pancreatitis, CHF with EF of 35%, ESRD-HD (Monday Wednesday Friday), DVT/PE on Eliquis, small bowel obstruction, anemia and chronic pain syndrome with chronic opiate use admitted on 05/09/2018 with nausea vomiting diarrhea and abdominal pain after missing 2 hemodialysis sessions prior Wednesday and Friday prior to admission.  History of noncompliance   Clinical Impression   This 52 y/o male presents with the above. At baseline pt reports independence-mod independence with ADLs and functional mobility using SPC. Pt overall presenting with generalized weakness and decreased activity tolerance. Pt initially completing room level mobility without AD and supervision, with continued room level activity pt utilizing RW and completing with minguard assist, suspect partly due to increased fatigue. Pt reports feeling increasingly weak compared to his baseline regarding ADL and mobility completion. He currently requires setup assist for UB ADL, minA for LB ADL. Given pt lives alone and with limited caregiver support recommend ST rehab stay in SNF setting after discharge to progress pt towards PLOF. Will continue to follow acutely.     Follow Up Recommendations  SNF;Supervision/Assistance - 24 hour    Equipment Recommendations  Other (comment)(TBD in next venue)           Precautions / Restrictions Precautions Precautions: Fall Precaution Comments: pt denies h/o falls Restrictions Weight Bearing Restrictions: No      Mobility Bed Mobility Overal bed mobility: Modified Independent             General bed mobility comments: OOB upon arrival  Transfers Overall transfer level: Needs assistance Equipment used: Rolling walker (2  wheeled) Transfers: Sit to/from Stand Sit to Stand: Min guard;From elevated surface         General transfer comment: from significantly elevated bed    Balance Overall balance assessment: Needs assistance   Sitting balance-Leahy Scale: Good       Standing balance-Leahy Scale: Fair                             ADL either performed or assessed with clinical judgement   ADL Overall ADL's : Needs assistance/impaired Eating/Feeding: Modified independent;Sitting   Grooming: Min guard;Standing;Wash/dry hands   Upper Body Bathing: Set up;Sitting   Lower Body Bathing: Minimal assistance;Sit to/from stand   Upper Body Dressing : Set up;Sitting   Lower Body Dressing: Minimal assistance;Sit to/from stand   Toilet Transfer: Supervision/safety;Ambulation;Regular Glass blower/designer Details (indicate cue type and reason): pt up in bathroom upon entering room; exiting bathroom and returning to EOB without physical assist  Toileting- Clothing Manipulation and Hygiene: Supervision/safety;Sit to/from stand       Functional mobility during ADLs: Supervision/safety;Min guard;Rolling walker General ADL Comments: upon initial arrival to room pt up in bathroom without RW, completing task and returning to sitting EOB with overall supervision; when cued to get up and wash hands pt electing/seeking to use RW, suspect partly due to fatigue, completing with minguard assist. Pt reports feeling increasingly weak compared to his baseline     Vision Baseline Vision/History: Wears glasses Wears Glasses: Reading only Patient Visual Report: No change from baseline Vision Assessment?: No apparent visual deficits     Perception     Praxis      Pertinent Vitals/Pain Pain Assessment: Faces Faces Pain Scale: Hurts a little  bit Pain Location: low back Pain Descriptors / Indicators: Sore Pain Intervention(s): Monitored during session     Hand Dominance Right   Extremity/Trunk  Assessment Upper Extremity Assessment Upper Extremity Assessment: Generalized weakness   Lower Extremity Assessment Lower Extremity Assessment: Defer to PT evaluation   Cervical / Trunk Assessment Cervical / Trunk Assessment: Normal   Communication Communication Communication: No difficulties   Cognition Arousal/Alertness: Awake/alert Behavior During Therapy: Flat affect Overall Cognitive Status: Within Functional Limits for tasks assessed                                     General Comments       Exercises     Shoulder Instructions      Home Living Family/patient expects to be discharged to:: Private residence Living Arrangements: Alone   Type of Home: Apartment Home Access: Stairs to enter Technical brewer of Steps: 4   Home Layout: One level     Bathroom Shower/Tub: Teacher, early years/pre: Handicapped height     Home Equipment: Environmental consultant - 2 wheels;Shower seat;Cane - single point          Prior Functioning/Environment Level of Independence: Independent with assistive device(s)        Comments: walks with SPC        OT Problem List: Decreased strength;Decreased activity tolerance      OT Treatment/Interventions: Self-care/ADL training;Therapeutic exercise;Therapeutic activities;Patient/family education;Balance training    OT Goals(Current goals can be found in the care plan section) Acute Rehab OT Goals Patient Stated Goal: to get strength back OT Goal Formulation: With patient Time For Goal Achievement: 05/28/18 Potential to Achieve Goals: Good  OT Frequency: Min 2X/week   Barriers to D/C:            Co-evaluation              AM-PAC PT "6 Clicks" Daily Activity     Outcome Measure Help from another person eating meals?: None Help from another person taking care of personal grooming?: None Help from another person toileting, which includes using toliet, bedpan, or urinal?: A Little Help from another  person bathing (including washing, rinsing, drying)?: A Little Help from another person to put on and taking off regular upper body clothing?: None Help from another person to put on and taking off regular lower body clothing?: A Little 6 Click Score: 21   End of Session Equipment Utilized During Treatment: Rolling walker Nurse Communication: Mobility status  Activity Tolerance: Patient tolerated treatment well;Other (comment)(pt somewhat self-limiting in participation ) Patient left: with call bell/phone within reach(sitting EOB)  OT Visit Diagnosis: Muscle weakness (generalized) (M62.81)                Time: 1217-1228 OT Time Calculation (min): 11 min Charges:  OT General Charges $OT Visit: 1 Visit OT Evaluation $OT Eval Low Complexity: Frank [name], OT Supplemental Rehabilitation Services Pager [phone] Office [phone]   Raymondo Band 05/14/2018, 12:46 PM

## 2018-05-14 NOTE — Discharge Instructions (Addendum)
1)Keep your hemodialysis appointment at [facility] hemodialysis center on Saturday, 05/16/2018 at 6:15 AM 2)Take medications as prescribed 3)Very low-salt diet advised 4)Weigh yourself daily, call if you gain more than 3 pounds in 1 day or more than 5 pounds in 1 week as your diuretic medications may need to be adjusted 5)Limit your Fluid  intake to no more than 60 ounces (1.8 Liters) per day   Information on my medicine - ELIQUIS (apixaban)  This medication education was reviewed with me or my healthcare representative as part of my discharge preparation.    Why was Eliquis prescribed for you? Eliquis was prescribed for you to reduce the risk of forming blood clots that can cause a stroke if you have a medical condition called atrial fibrillation (a type of irregular heartbeat) OR to reduce the risk of a blood clots forming after orthopedic surgery.  What do You need to know about Eliquis ? Take your Eliquis TWICE DAILY - one tablet in the morning and one tablet in the evening with or without food.  It would be best to take the doses about the same time each day.  If you have difficulty swallowing the tablet whole please discuss with your pharmacist how to take the medication safely.  Take Eliquis exactly as prescribed by your doctor and DO NOT stop taking Eliquis without talking to the doctor who prescribed the medication.  Stopping may increase your risk of developing a new clot or stroke.  Refill your prescription before you run out.  After discharge, you should have regular check-up appointments with your healthcare provider that is prescribing your Eliquis.  In the future your dose may need to be changed if your kidney function or weight changes by a significant amount or as you get older.  What do you do if you miss a dose? If you miss a dose, take it as soon as you remember on the same day and resume taking twice daily.  Do not take more than one dose of ELIQUIS at the  same time.  Important Safety Information A possible side effect of Eliquis is bleeding. You should call your healthcare provider right away if you experience any of the following: ? Bleeding from an injury or your nose that does not stop. ? Unusual colored urine (red or dark brown) or unusual colored stools (red or black). ? Unusual bruising for unknown reasons. ? A serious fall or if you hit your head (even if there is no bleeding).  Some medicines may interact with Eliquis and might increase your risk of bleeding or clotting while on Eliquis. To help avoid this, consult your healthcare provider or pharmacist prior to using any new prescription or non-prescription medications, including herbals, vitamins, non-steroidal anti-inflammatory drugs (NSAIDs) and supplements.  This website has more information on Eliquis (apixaban): [email or web address].   1)Keep your Hemodialysis appointment at [facility] hemodialysis center on Saturday, 05/16/2018 at 6:15 AM 2)Take medications as prescribed 3)Very low-salt diet advised 4)Weigh yourself daily, call if you gain more than 3 pounds in 1 day or more than 5 pounds in 1 week as your diuretic medications may need to be adjusted 5)Limit your Fluid  intake to no more than 60 ounces (1.8 Liters) per day

## 2018-05-15 DIAGNOSIS — R112 Nausea with vomiting, unspecified: Secondary | ICD-10-CM | POA: Diagnosis present

## 2018-05-15 DIAGNOSIS — Z940 Kidney transplant status: Secondary | ICD-10-CM | POA: Diagnosis not present

## 2018-05-15 DIAGNOSIS — F4323 Adjustment disorder with mixed anxiety and depressed mood: Secondary | ICD-10-CM | POA: Diagnosis not present

## 2018-05-15 DIAGNOSIS — N2581 Secondary hyperparathyroidism of renal origin: Secondary | ICD-10-CM | POA: Diagnosis not present

## 2018-05-15 DIAGNOSIS — K219 Gastro-esophageal reflux disease without esophagitis: Secondary | ICD-10-CM | POA: Diagnosis present

## 2018-05-15 DIAGNOSIS — R1084 Generalized abdominal pain: Secondary | ICD-10-CM | POA: Diagnosis not present

## 2018-05-15 DIAGNOSIS — R2689 Other abnormalities of gait and mobility: Secondary | ICD-10-CM | POA: Diagnosis not present

## 2018-05-15 DIAGNOSIS — I132 Hypertensive heart and chronic kidney disease with heart failure and with stage 5 chronic kidney disease, or end stage renal disease: Secondary | ICD-10-CM | POA: Diagnosis not present

## 2018-05-15 DIAGNOSIS — G4733 Obstructive sleep apnea (adult) (pediatric): Secondary | ICD-10-CM | POA: Diagnosis present

## 2018-05-15 DIAGNOSIS — I5043 Acute on chronic combined systolic (congestive) and diastolic (congestive) heart failure: Secondary | ICD-10-CM | POA: Diagnosis not present

## 2018-05-15 DIAGNOSIS — J90 Pleural effusion, not elsewhere classified: Secondary | ICD-10-CM | POA: Diagnosis not present

## 2018-05-15 DIAGNOSIS — M5440 Lumbago with sciatica, unspecified side: Secondary | ICD-10-CM | POA: Diagnosis not present

## 2018-05-15 DIAGNOSIS — N186 End stage renal disease: Secondary | ICD-10-CM | POA: Diagnosis not present

## 2018-05-15 DIAGNOSIS — K529 Noninfective gastroenteritis and colitis, unspecified: Secondary | ICD-10-CM | POA: Diagnosis not present

## 2018-05-15 DIAGNOSIS — J811 Chronic pulmonary edema: Secondary | ICD-10-CM | POA: Diagnosis not present

## 2018-05-15 DIAGNOSIS — D689 Coagulation defect, unspecified: Secondary | ICD-10-CM | POA: Diagnosis not present

## 2018-05-15 DIAGNOSIS — E162 Hypoglycemia, unspecified: Secondary | ICD-10-CM | POA: Diagnosis not present

## 2018-05-15 DIAGNOSIS — N2889 Other specified disorders of kidney and ureter: Secondary | ICD-10-CM | POA: Diagnosis not present

## 2018-05-15 DIAGNOSIS — I5042 Chronic combined systolic (congestive) and diastolic (congestive) heart failure: Secondary | ICD-10-CM | POA: Diagnosis present

## 2018-05-15 DIAGNOSIS — R918 Other nonspecific abnormal finding of lung field: Secondary | ICD-10-CM | POA: Diagnosis not present

## 2018-05-15 DIAGNOSIS — I509 Heart failure, unspecified: Secondary | ICD-10-CM | POA: Diagnosis not present

## 2018-05-15 DIAGNOSIS — Z888 Allergy status to other drugs, medicaments and biological substances status: Secondary | ICD-10-CM | POA: Diagnosis not present

## 2018-05-15 DIAGNOSIS — I10 Essential (primary) hypertension: Secondary | ICD-10-CM | POA: Diagnosis not present

## 2018-05-15 DIAGNOSIS — R109 Unspecified abdominal pain: Secondary | ICD-10-CM | POA: Diagnosis not present

## 2018-05-15 DIAGNOSIS — M545 Low back pain: Secondary | ICD-10-CM | POA: Diagnosis not present

## 2018-05-15 DIAGNOSIS — E1122 Type 2 diabetes mellitus with diabetic chronic kidney disease: Secondary | ICD-10-CM | POA: Diagnosis not present

## 2018-05-15 DIAGNOSIS — M6281 Muscle weakness (generalized): Secondary | ICD-10-CM | POA: Diagnosis not present

## 2018-05-15 DIAGNOSIS — I110 Hypertensive heart disease with heart failure: Secondary | ICD-10-CM | POA: Diagnosis not present

## 2018-05-15 DIAGNOSIS — J9811 Atelectasis: Secondary | ICD-10-CM | POA: Diagnosis not present

## 2018-05-15 DIAGNOSIS — T8610 Unspecified complication of kidney transplant: Secondary | ICD-10-CM | POA: Diagnosis not present

## 2018-05-15 DIAGNOSIS — E875 Hyperkalemia: Secondary | ICD-10-CM | POA: Diagnosis present

## 2018-05-15 DIAGNOSIS — K861 Other chronic pancreatitis: Secondary | ICD-10-CM | POA: Diagnosis not present

## 2018-05-15 DIAGNOSIS — G894 Chronic pain syndrome: Secondary | ICD-10-CM | POA: Diagnosis not present

## 2018-05-15 DIAGNOSIS — Z5329 Procedure and treatment not carried out because of patient's decision for other reasons: Secondary | ICD-10-CM | POA: Diagnosis not present

## 2018-05-15 DIAGNOSIS — D509 Iron deficiency anemia, unspecified: Secondary | ICD-10-CM | POA: Diagnosis not present

## 2018-05-15 DIAGNOSIS — Z992 Dependence on renal dialysis: Secondary | ICD-10-CM | POA: Diagnosis not present

## 2018-05-15 DIAGNOSIS — Z79899 Other long term (current) drug therapy: Secondary | ICD-10-CM | POA: Diagnosis not present

## 2018-05-15 DIAGNOSIS — R278 Other lack of coordination: Secondary | ICD-10-CM | POA: Diagnosis not present

## 2018-05-15 DIAGNOSIS — D631 Anemia in chronic kidney disease: Secondary | ICD-10-CM | POA: Diagnosis not present

## 2018-05-15 LAB — GLUCOSE, CAPILLARY
GLUCOSE-CAPILLARY: 97 mg/dL (ref 70–99)
Glucose-Capillary: 107 mg/dL — ABNORMAL HIGH (ref 70–99)
Glucose-Capillary: 125 mg/dL — ABNORMAL HIGH (ref 70–99)

## 2018-05-15 MED ORDER — HYDROXYZINE HCL 10 MG PO TABS: 10.0000 mg | ORAL_TABLET | Freq: Three times a day (TID) | ORAL | 0 refills | Status: DC | PRN

## 2018-05-15 MED ORDER — ALPRAZOLAM 0.5 MG PO TABS: 0.2500 mg | ORAL_TABLET | Freq: Two times a day (BID) | ORAL | 0 refills | Status: DC | PRN

## 2018-05-15 MED ORDER — ONDANSETRON HCL 8 MG PO TABS: 8.0000 mg | ORAL_TABLET | Freq: Three times a day (TID) | ORAL | 0 refills | Status: DC | PRN

## 2018-05-15 MED ORDER — ZOLPIDEM TARTRATE 5 MG PO TABS: 5.0000 mg | ORAL_TABLET | Freq: Every day | ORAL | 0 refills | Status: DC

## 2018-05-15 MED ORDER — POLYETHYLENE GLYCOL 3350 17 G PO PACK: 17.0000 g | PACK | Freq: Every day | ORAL | 0 refills | Status: DC

## 2018-05-15 MED ORDER — OXYCODONE HCL 10 MG PO TABS: 10.0000 mg | ORAL_TABLET | Freq: Four times a day (QID) | ORAL | 0 refills | Status: DC | PRN

## 2018-05-15 MED ORDER — SENNOSIDES-DOCUSATE SODIUM 8.6-50 MG PO TABS: 2.0000 | ORAL_TABLET | Freq: Every day | ORAL | 1 refills | Status: DC

## 2018-05-15 MED ORDER — PANCRELIPASE (LIP-PROT-AMYL) 12000-38000 UNITS PO CPEP: 12000.0000 [IU] | ORAL_CAPSULE | Freq: Three times a day (TID) | ORAL | 2 refills | Status: DC
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
## 2018-05-15 NOTE — Progress Notes (Signed)
Frank [name] to be D/C'd Skilled nursing facility per MD order.  Discussed prescriptions and follow up appointments with the patient. Prescriptions given to patient, medication list explained in detail. Pt verbalized understanding.  Allergies as of 05/15/2018      Reactions   Butalbital-apap-caffeine Shortness Of Breath, Swelling, Other (See Comments)   Swelling in throat   Ferrlecit [na Ferric Gluc Cplx In Sucrose] Shortness Of Breath, Swelling, Other (See Comments)   Swelling in throat, tolerates Venofor   Minoxidil Shortness Of Breath   Tylenol [acetaminophen] Anaphylaxis, Swelling   Darvocet [propoxyphene N-acetaminophen] Hives      Medication List    STOP taking these medications   metroNIDAZOLE 500 MG tablet Commonly known as:  FLAGYL     TAKE these medications   ALPRAZolam 0.5 MG tablet Commonly known as:  XANAX Take 0.5 tablets (0.25 mg total) by mouth 2 (two) times daily as needed for anxiety or sleep. What changed:  reasons to take this   amLODipine 5 MG tablet Commonly known as:  NORVASC Take 5 mg by mouth daily.   busPIRone 5 MG tablet Commonly known as:  BUSPAR Take 5 mg by mouth 2 (two) times daily.   carvedilol 25 MG tablet Commonly known as:  COREG Take 25 mg by mouth 2 (two) times daily with a meal.   cinacalcet 90 MG tablet Commonly known as:  SENSIPAR Take 90 mg by mouth every evening.   diclofenac sodium 1 % Gel Commonly known as:  VOLTAREN Apply 4 gram transdermaly four times daily for left shoulder pain   dicyclomine 10 MG/5ML syrup Commonly known as:  BENTYL Take 5 mLs by mouth 4 (four) times daily as needed for spasms.   ELIQUIS 5 MG Tabs tablet Generic drug:  apixaban Take 5 mg by mouth 2 (two) times daily.   gabapentin 300 MG capsule Commonly known as:  NEURONTIN Take 300 mg by mouth every evening.   hydrALAZINE 100 MG tablet Commonly known as:  APRESOLINE Take 100 mg by mouth 3 (three) times daily.   hydrOXYzine 10 MG  tablet Commonly known as:  ATARAX/VISTARIL Take 1 tablet (10 mg total) by mouth 3 (three) times daily as needed for anxiety, nausea or vomiting.   lanthanum 1000 MG chewable tablet Commonly known as:  FOSRENOL Chew 1,000 mg by mouth 3 (three) times daily with meals.   lipase/protease/amylase 12000 units Cpep capsule Commonly known as:  CREON Take 1 capsule (12,000 Units total) by mouth 3 (three) times daily before meals.   lisinopril 5 MG tablet Commonly known as:  PRINIVIL,ZESTRIL Take 5 mg by mouth daily.   multivitamin Tabs tablet Take 1 tablet by mouth daily.   B complex-vitamin C-folic acid 1 MG tablet Take 1 tablet by mouth at bedtime.   NEPHRO VITAMINS 0.8 MG Tabs Take 1 tablet by mouth daily.   nitroGLYCERIN 0.4 MG SL tablet Commonly known as:  NITROSTAT Place 0.4 mg under the tongue every 5 (five) minutes as needed for chest pain.   NUTRITIONAL SUPPLEMENT PO Liberalized Renal Diet - Regular Texture   ondansetron 8 MG tablet Commonly known as:  ZOFRAN Take 1 tablet (8 mg total) by mouth every 8 (eight) hours as needed for nausea or vomiting.   Oxycodone HCl 10 MG Tabs Take 1 tablet (10 mg total) by mouth every 6 (six) hours as needed (prn moderate to severe pain). What changed:    reasons to take this  Another medication with the same name was removed.  Continue taking this medication, and follow the directions you see here.   OXYGEN O2 via nasal cannula at 2L/min for O2 sats 90% or below as needed for shortness of breath   pantoprazole 40 MG tablet Commonly known as:  PROTONIX Take 1 tablet (40 mg total) by mouth daily.   polyethylene glycol packet Commonly known as:  MIRALAX / GLYCOLAX Take 17 g by mouth daily.   promethazine 25 MG tablet Commonly known as:  PHENERGAN Take 25 mg by mouth every 6 (six) hours as needed for nausea or vomiting.   senna-docusate 8.6-50 MG tablet Commonly known as:  Senokot-S Take 2 tablets by mouth at bedtime.    zolpidem 5 MG tablet Commonly known as:  AMBIEN Take 1 tablet (5 mg total) by mouth at bedtime.       Vitals:   05/15/18 0559 05/15/18 0927  BP: 127/79 115/75  Pulse: 72 70  Resp: 12 19  Temp: 97.8 F (36.6 C) 98.2 F (36.8 C)  SpO2: 99% 95%    Skin clean, dry and intact without evidence of skin break down, no evidence of skin tears noted. IV catheter discontinued intact. Site without signs and symptoms of complications. Dressing and pressure applied. Pt denies pain at this time. No complaints noted.  An After Visit Summary was printed and given to the patient. Patient escorted via WC, and D/C to [facility] via private auto.  Aneta Mins, RN

## 2018-05-15 NOTE — Clinical Social Work Note (Signed)
Patient informed CSW and his nurse [name] that he had scheduled an appointment to see his heart doctor this afternoon and needed to leave between 2:30 - 3:00 pm. Patient also advised CSW that he had rescheduled his dialysis appointment for Saturday morning. MD contacted and notified by patient's nurse [place].  CSW contacted [facility] and confirmed that patient rescheduled his dialysis appointment for Saturday and he should arrive at 6:15 am and begin dialysis at 6:25 am. He should be done by 10:25 am and ready to leave at 10:30 am. Per Mr. Poth, his sister will transport him to dialysis or he will call [facility].  CSW contacted [facility] [phone] and confirmed that he has a 3:30 pm appointment today with Dr. Elenore Paddy, NP-C. Patient indicated that his sister is transporting him to this appointment and will take him to [place] afterwards.  CSW talked with Abigail Butts, admissions staff person at [facility] regarding the above and she asked patient's sister to call her when they are leaving the doctor's office. Patient provided with [facility] name and number.  CSW commended patient on appropriately handling his medical needs and communicating with this CSW, his doctor and nursing staff. CSW signing off as no other SW intervention services needed.  Marielouise Amey Givens, MSW, LCSW Licensed Clinical Social Worker [place] [phone]

## 2018-05-15 NOTE — Progress Notes (Signed)
Patient stated that he did not want to go to the HD session in the hospital because he needed to be at his cardiologists appointment at three. Patient stated that he would go to his outpatient HD center. Patient wanting to be D/C'd now. MD notified. MD spoke with patient and SW Lorriane Shire, please see SW note for further detail. MD stated that he was putting in the D/C order now. Will provide pt. With D/C instructions.

## 2018-05-15 NOTE — Clinical Social Work Placement (Addendum)
   CLINICAL SOCIAL WORK PLACEMENT  NOTE 05/15/18 - DISCHARGING TO [facility], TRANSPORTED BY FAMILY.  Date:  05/15/2018  Patient Details  Name: Frank [name] MRN: [id number] Date of Birth: April 19, 1966  Clinical Social Work is seeking post-discharge placement for this patient at the [place] level of care (*CSW will initial, date and re-position this form in  chart as items are completed):  Yes   Patient/family provided with [facility] Work Department's list of facilities offering this level of care within the geographic area requested by the patient (or if unable, by the patient's family).  Yes   Patient/family informed of their freedom to choose among providers that offer the needed level of care, that participate in Medicare, Medicaid or managed care program needed by the patient, have an available bed and are willing to accept the patient.  Yes   Patient/family informed of [facility]'s ownership interest in [facility] and [facility], as well as of the fact that they are under no obligation to receive care at these facilities.  PASRR submitted to EDS on       PASRR number received on       Existing PASRR number confirmed on 05/14/18     FL2 transmitted to all facilities in geographic area requested by pt/family on 05/14/18     FL2 transmitted to all facilities within larger geographic area on       Patient informed that his/her managed care company has contracts with or will negotiate with certain facilities, including the following:        Yes(05/15/18)   Patient/family informed of bed offers received.  Patient chooses bed at   [facility] recommends and patient chooses bed at      Patient to be transferred to  [facility] on  05/15/18.  Patient to be transferred to facility by  his sister, Frank [name].     Patient family notified on  05/15/18 of transfer.  Name of family member notified:   Sister,  Frank [name] by patient.     PHYSICIAN       Additional Comment:    _______________________________________________ Sable Feil, LCSW 05/15/2018, 11:20 AM

## 2018-05-15 NOTE — Clinical Social Work Note (Signed)
Clinical Social Work Assessment  Patient Details  Name: Frank [name] MRN: [id number] Date of Birth: 01-Jun-1966  Date of referral:  05/13/18               Reason for consult:  Facility Placement, Discharge Planning                Permission sought to share information with:  Family Supports Permission granted to share information::  No  Name::        Agency::     Relationship::     Contact Information:     Housing/Transportation Living arrangements for the past 2 months:  Single Family Home Source of Information:  Patient Patient Interpreter Needed:  None Criminal Activity/Legal Involvement Pertinent to Current Situation/Hospitalization:  No - Comment as needed Significant Relationships:  Siblings(Sister Modena Morrow) Lives with:  Self Do you feel safe going back to the place where you live?  No(Patient interested in going to SNF at d/c for rehab) Need for family participation in patient care:  No (Coment)  Care giving concerns:  Patient reported that he lives alone and needs assistance due to weakness.  Social Worker assessment / plan:  Patient requested to talk with CSW regarding his discharge disposition. CSW visited with patient on 10/23 and Mr. Hickel was sitting up in bed and was alert, oriented and very open to talking with CSW. Patient requested SNF and advised CSW of facility that he did not want to go to at discharge. When asked, patient reported that he was last at [facility] for about 17 days. Mr. Baird also advised CSW that he lives alone. Patient reported that he missed his last dialysis session before coming to the hospital.   Employment status:  Disabled (Comment on whether or not currently receiving Disability) Insurance information:  Medicare PT Recommendations:  [place] / Referral to community resources:  [place]  Patient/Family's Response to care:  No concerns expressed by patient regarding his care during  hospitalization.  Patient/Family's Understanding of and Emotional Response to Diagnosis, Current Treatment, and Prognosis:  Patient requesting SNF due to weakness and living alone.  Emotional Assessment Appearance:  Appears stated age Attitude/Demeanor/Rapport:  Engaged Affect (typically observed):  Appropriate, Pleasant Orientation:  Oriented to Self, Oriented to Place, Oriented to  Time, Oriented to Situation Alcohol / Substance use:  Tobacco Use, Alcohol Use, Illicit Drugs(Patient reportes that he quit smoking, has current/past drug use history-marijuanna and does not drink.) Psych involvement (Current and /or in the community):  No (Comment)  Discharge Needs  Concerns to be addressed:  Discharge Planning Concerns Readmission within the last 30 days:  Yes Current discharge risk:  None Barriers to Discharge:  Continued Medical Work up   [employer] Mila Homer, [place] 05/15/2018, 10:21 AM

## 2018-05-15 NOTE — Progress Notes (Addendum)
[facility] Progress Note   Subjective:  Reports slight improvement in abdominal pain with pain meds, but returns within 1-2hrs.  Denies CP, SOB, and edema.  Hoping to go to rehab when discharged.   Objective Vitals:   05/14/18 1752 05/14/18 2055 05/15/18 0559 05/15/18 0927  BP: 115/70 101/64 127/79 115/75  Pulse: 68 69 72 70  Resp: _0 Temp: 98.2 F (36.8 C) 98.1 F (36.7 C) 97.8 F (36.6 C) 98.2 F (36.8 C)  TempSrc: Oral Oral Oral Oral  SpO2: 100% 96% 99% 95%  Weight:   78.5 kg   Height:       Physical Exam General:NAD, chronically ill appear male Heart:RRR, no mrg Lungs:CTAB Abdomen:soft, +tenderness Extremities:trace edema Dialysis Access: LU AVF +b/t   Filed Weights   05/13/18 2005 05/14/18 0049 05/15/18 0559  Weight: 78.8 kg 76 kg 78.5 kg    Intake/Output Summary (Last 24 hours) at 05/15/2018 0958 Last data filed at 05/15/2018 0601 Gross per 24 hour  Intake 1056 ml  Output -  Net 1056 ml    Additional Objective Labs: Basic Metabolic Panel: Recent Labs  Lab 05/11/18 0810 05/12/18 1330 05/13/18 2040  NA 134* 134* 135  K 5.3* 4.8 4.2  CL 101 101 101  CO2 20* 23 26  GLUCOSE 105* 125* 80  BUN 72* 38* 28*  CREATININE 14.03* 8.92* 7.65*  CALCIUM 8.4* 8.6* 7.7*  PHOS 5.7* 4.2 3.3   Liver Function Tests: Recent Labs  Lab 05/08/18 1918 05/09/18 1218 05/11/18 0810 05/12/18 1330 05/13/18 2040  AST 19 30  --   --   --   ALT 12 12  --   --   --   ALKPHOS 126 119  --   --   --   BILITOT 0.6 0.9  --   --   --   PROT 7.4 7.8  --   --   --   ALBUMIN 2.6* 2.9* 2.5* 2.5* 2.4*   Recent Labs  Lab 05/08/18 1918 05/09/18 1218  LIPASE 93* 48   CBC: Recent Labs  Lab 05/09/18 1218 05/10/18 0644 05/11/18 0610 05/12/18 1230 05/13/18 2040  WBC 5.5 7.1 6.4 6.8 4.8  NEUTROABS  --   --   --  5.5  --   HGB 9.0* 7.9* 7.7* 8.0* 7.9*  HCT 31.2* 25.4* 24.9* 26.1* 26.9*  MCV 92.0 85.2 86.8 87.3 88.2  PLT 208 208 197 170 180    Blood Culture    Component Value Date/Time   SDES PLEURAL RIGHT 02/01/2018 0922   SDES PLEURAL RIGHT 02/01/2018 0922   SPECREQUEST NONE 02/01/2018 0922   SPECREQUEST NONE 02/01/2018 0922   CULT  02/01/2018 0922    NO GROWTH 5 DAYS Performed at [facility], [place] [address], [place], [place] [address]    REPTSTATUS 02/06/2018 FINAL 02/01/2018 0922   REPTSTATUS 02/02/2018 FINAL 02/01/2018 0922   CBG: Recent Labs  Lab 05/14/18 1122 05/14/18 1754 05/14/18 2055 05/15/18 0231 05/15/18 0740  GLUCAP 77 75 138* 107* 97   Studies/Results: No results found.  Medications:  . amLODipine  5 mg Oral Daily  . apixaban  5 mg Oral BID  . busPIRone  5 mg Oral BID  . carvedilol  25 mg Oral BID WC  . Chlorhexidine Gluconate Cloth  6 each Topical Q0600  . cinacalcet  90 mg Oral QPM  . darbepoetin (ARANESP) injection - DIALYSIS  200 mcg Intravenous Q Fri-HD  . dicyclomine  10  mg Oral TID AC & HS  . doxercalciferol  5 mcg Intravenous Q M,W,F-HD  . gabapentin  300 mg Oral QPM  . hydrALAZINE  100 mg Oral TID  . lanthanum  1,000 mg Oral TID WC  . lipase/protease/amylase  12,000 Units Oral TID AC  . lisinopril  5 mg Oral Daily  . metoCLOPramide (REGLAN) injection  5 mg Intravenous Q6H  . multivitamin  1 tablet Oral QHS  . pantoprazole  40 mg Oral Daily  . polyethylene glycol  17 g Oral BID  . senna-docusate  2 tablet Oral BID    Dialysis Orders: [facility] 4h 75kg 2/2.25 bath LFA AVF Hep none - mircera 225 last 10/14 - hect 5 ug  Assessment/Plan: 1. Chronic pancreatitis w/chronic pain syndrome/Opioid addiction - Per primary 2. ESRD - on HD MWF.  HD scheduled for today.  K 4.2. No Heparin. 3. Anemia of CKD- Hgb 7.9. Aranesp 273mg IV qwk (Fri) ordered with HD today.  4. Secondary hyperparathyroidism - Phos and CCa in goal. Continue VDRA, sensipar and binders.  5. HTN/volume - BP well controlled.  Does not appear volume overloaded on exam.  Plan to raise EDW to 76kg.   Should meet new EDW with HD today.  6. Nutrition - Alb 2.4. Renal diet w/fluid restrictions. Nepro. Renavite.   LJen Mow PA-C [facility] Pager: [phone]/25/2019,9:58 AM  LOS: 5 days   I have seen and examined this patient and agree with plan and assessment in the above note with renal recommendations/intervention highlighted.  Mr. CAlmasis going to his outpatient Cardiology appointment and then to [facility].  He is set up for HD tomorrow morning at his outpatient unit.  JGovernor RooksColadonato,MD 05/15/2018 3:49 PM

## 2018-05-16 DIAGNOSIS — Z992 Dependence on renal dialysis: Secondary | ICD-10-CM | POA: Diagnosis not present

## 2018-05-16 DIAGNOSIS — N2581 Secondary hyperparathyroidism of renal origin: Secondary | ICD-10-CM | POA: Diagnosis not present

## 2018-05-16 DIAGNOSIS — N186 End stage renal disease: Secondary | ICD-10-CM | POA: Diagnosis not present

## 2018-05-16 DIAGNOSIS — D631 Anemia in chronic kidney disease: Secondary | ICD-10-CM | POA: Diagnosis not present

## 2018-05-16 DIAGNOSIS — D509 Iron deficiency anemia, unspecified: Secondary | ICD-10-CM | POA: Diagnosis not present

## 2018-05-16 LAB — TYPE AND SCREEN
ABO/RH(D): B POS
ANTIBODY SCREEN: POSITIVE
DAT, IgG: NEGATIVE
Donor AG Type: NEGATIVE
Donor AG Type: NEGATIVE
Unit division: 0
Unit division: 0

## 2018-05-16 LAB — BPAM RBC
BLOOD PRODUCT EXPIRATION DATE: 201911042359
BLOOD PRODUCT EXPIRATION DATE: 201911112359
Unit Type and Rh: 5100
Unit Type and Rh: 5100

## 2018-05-18 DIAGNOSIS — N186 End stage renal disease: Secondary | ICD-10-CM | POA: Diagnosis not present

## 2018-05-18 DIAGNOSIS — D509 Iron deficiency anemia, unspecified: Secondary | ICD-10-CM | POA: Diagnosis not present

## 2018-05-18 DIAGNOSIS — D631 Anemia in chronic kidney disease: Secondary | ICD-10-CM | POA: Diagnosis not present

## 2018-05-18 DIAGNOSIS — N2581 Secondary hyperparathyroidism of renal origin: Secondary | ICD-10-CM | POA: Diagnosis not present

## 2018-05-18 DIAGNOSIS — Z992 Dependence on renal dialysis: Secondary | ICD-10-CM | POA: Diagnosis not present

## 2018-05-19 DIAGNOSIS — I10 Essential (primary) hypertension: Secondary | ICD-10-CM | POA: Diagnosis not present

## 2018-05-19 DIAGNOSIS — G894 Chronic pain syndrome: Secondary | ICD-10-CM | POA: Diagnosis not present

## 2018-05-19 DIAGNOSIS — N186 End stage renal disease: Secondary | ICD-10-CM | POA: Diagnosis not present

## 2018-05-19 DIAGNOSIS — K861 Other chronic pancreatitis: Secondary | ICD-10-CM | POA: Diagnosis not present

## 2018-05-20 DIAGNOSIS — N2581 Secondary hyperparathyroidism of renal origin: Secondary | ICD-10-CM | POA: Diagnosis not present

## 2018-05-20 DIAGNOSIS — D509 Iron deficiency anemia, unspecified: Secondary | ICD-10-CM | POA: Diagnosis not present

## 2018-05-20 DIAGNOSIS — N186 End stage renal disease: Secondary | ICD-10-CM | POA: Diagnosis not present

## 2018-05-20 DIAGNOSIS — Z992 Dependence on renal dialysis: Secondary | ICD-10-CM | POA: Diagnosis not present

## 2018-05-20 DIAGNOSIS — D631 Anemia in chronic kidney disease: Secondary | ICD-10-CM | POA: Diagnosis not present

## 2018-05-22 DIAGNOSIS — N2581 Secondary hyperparathyroidism of renal origin: Secondary | ICD-10-CM | POA: Diagnosis not present

## 2018-05-22 DIAGNOSIS — T8610 Unspecified complication of kidney transplant: Secondary | ICD-10-CM | POA: Diagnosis not present

## 2018-05-22 DIAGNOSIS — N186 End stage renal disease: Secondary | ICD-10-CM | POA: Diagnosis not present

## 2018-05-22 DIAGNOSIS — Z992 Dependence on renal dialysis: Secondary | ICD-10-CM | POA: Diagnosis not present

## 2018-05-22 DIAGNOSIS — D509 Iron deficiency anemia, unspecified: Secondary | ICD-10-CM | POA: Diagnosis not present

## 2018-05-22 DIAGNOSIS — D631 Anemia in chronic kidney disease: Secondary | ICD-10-CM | POA: Diagnosis not present

## 2018-05-23 DIAGNOSIS — N2581 Secondary hyperparathyroidism of renal origin: Secondary | ICD-10-CM | POA: Diagnosis not present

## 2018-05-23 DIAGNOSIS — Z992 Dependence on renal dialysis: Secondary | ICD-10-CM | POA: Diagnosis not present

## 2018-05-23 DIAGNOSIS — D631 Anemia in chronic kidney disease: Secondary | ICD-10-CM | POA: Diagnosis not present

## 2018-05-23 DIAGNOSIS — N186 End stage renal disease: Secondary | ICD-10-CM | POA: Diagnosis not present

## 2018-05-23 DIAGNOSIS — D509 Iron deficiency anemia, unspecified: Secondary | ICD-10-CM | POA: Diagnosis not present

## 2018-05-24 DIAGNOSIS — K5730 Diverticulosis of large intestine without perforation or abscess without bleeding: Secondary | ICD-10-CM | POA: Diagnosis not present

## 2018-05-24 DIAGNOSIS — I5042 Chronic combined systolic (congestive) and diastolic (congestive) heart failure: Secondary | ICD-10-CM | POA: Diagnosis not present

## 2018-05-24 DIAGNOSIS — K861 Other chronic pancreatitis: Secondary | ICD-10-CM | POA: Diagnosis not present

## 2018-05-24 DIAGNOSIS — R918 Other nonspecific abnormal finding of lung field: Secondary | ICD-10-CM | POA: Diagnosis not present

## 2018-05-24 DIAGNOSIS — K529 Noninfective gastroenteritis and colitis, unspecified: Secondary | ICD-10-CM | POA: Diagnosis not present

## 2018-05-24 DIAGNOSIS — J9811 Atelectasis: Secondary | ICD-10-CM | POA: Diagnosis not present

## 2018-05-24 DIAGNOSIS — E1122 Type 2 diabetes mellitus with diabetic chronic kidney disease: Secondary | ICD-10-CM | POA: Diagnosis not present

## 2018-05-24 DIAGNOSIS — I132 Hypertensive heart and chronic kidney disease with heart failure and with stage 5 chronic kidney disease, or end stage renal disease: Secondary | ICD-10-CM | POA: Diagnosis not present

## 2018-05-24 DIAGNOSIS — I509 Heart failure, unspecified: Secondary | ICD-10-CM | POA: Diagnosis not present

## 2018-05-24 DIAGNOSIS — N2889 Other specified disorders of kidney and ureter: Secondary | ICD-10-CM | POA: Diagnosis not present

## 2018-05-24 DIAGNOSIS — J90 Pleural effusion, not elsewhere classified: Secondary | ICD-10-CM | POA: Diagnosis not present

## 2018-05-24 DIAGNOSIS — N186 End stage renal disease: Secondary | ICD-10-CM | POA: Diagnosis not present

## 2018-05-24 DIAGNOSIS — R160 Hepatomegaly, not elsewhere classified: Secondary | ICD-10-CM | POA: Diagnosis not present

## 2018-05-24 DIAGNOSIS — R109 Unspecified abdominal pain: Secondary | ICD-10-CM | POA: Diagnosis not present

## 2018-05-24 DIAGNOSIS — Z992 Dependence on renal dialysis: Secondary | ICD-10-CM | POA: Diagnosis not present

## 2018-05-24 DIAGNOSIS — Z940 Kidney transplant status: Secondary | ICD-10-CM | POA: Diagnosis not present

## 2018-05-24 DIAGNOSIS — J811 Chronic pulmonary edema: Secondary | ICD-10-CM | POA: Diagnosis not present

## 2018-05-25 DIAGNOSIS — R1110 Vomiting, unspecified: Secondary | ICD-10-CM | POA: Diagnosis not present

## 2018-05-25 DIAGNOSIS — K219 Gastro-esophageal reflux disease without esophagitis: Secondary | ICD-10-CM | POA: Diagnosis not present

## 2018-05-25 DIAGNOSIS — K861 Other chronic pancreatitis: Secondary | ICD-10-CM | POA: Diagnosis present

## 2018-05-25 DIAGNOSIS — R112 Nausea with vomiting, unspecified: Secondary | ICD-10-CM | POA: Diagnosis present

## 2018-05-25 DIAGNOSIS — Z992 Dependence on renal dialysis: Secondary | ICD-10-CM | POA: Diagnosis not present

## 2018-05-25 DIAGNOSIS — N2889 Other specified disorders of kidney and ureter: Secondary | ICD-10-CM | POA: Diagnosis not present

## 2018-05-25 DIAGNOSIS — N186 End stage renal disease: Secondary | ICD-10-CM | POA: Diagnosis not present

## 2018-05-25 DIAGNOSIS — Z5329 Procedure and treatment not carried out because of patient's decision for other reasons: Secondary | ICD-10-CM | POA: Diagnosis not present

## 2018-05-25 DIAGNOSIS — I132 Hypertensive heart and chronic kidney disease with heart failure and with stage 5 chronic kidney disease, or end stage renal disease: Secondary | ICD-10-CM | POA: Diagnosis present

## 2018-05-25 DIAGNOSIS — Z940 Kidney transplant status: Secondary | ICD-10-CM | POA: Diagnosis not present

## 2018-05-25 DIAGNOSIS — Z79899 Other long term (current) drug therapy: Secondary | ICD-10-CM | POA: Diagnosis not present

## 2018-05-25 DIAGNOSIS — E875 Hyperkalemia: Secondary | ICD-10-CM | POA: Diagnosis present

## 2018-05-25 DIAGNOSIS — Z888 Allergy status to other drugs, medicaments and biological substances status: Secondary | ICD-10-CM | POA: Diagnosis not present

## 2018-05-25 DIAGNOSIS — I10 Essential (primary) hypertension: Secondary | ICD-10-CM | POA: Diagnosis not present

## 2018-05-25 DIAGNOSIS — I5042 Chronic combined systolic (congestive) and diastolic (congestive) heart failure: Secondary | ICD-10-CM | POA: Diagnosis present

## 2018-05-25 DIAGNOSIS — R109 Unspecified abdominal pain: Secondary | ICD-10-CM | POA: Diagnosis not present

## 2018-05-25 DIAGNOSIS — G4733 Obstructive sleep apnea (adult) (pediatric): Secondary | ICD-10-CM | POA: Diagnosis present

## 2018-05-25 DIAGNOSIS — G4731 Primary central sleep apnea: Secondary | ICD-10-CM | POA: Diagnosis not present

## 2018-05-25 DIAGNOSIS — K5730 Diverticulosis of large intestine without perforation or abscess without bleeding: Secondary | ICD-10-CM | POA: Diagnosis not present

## 2018-05-25 DIAGNOSIS — E1122 Type 2 diabetes mellitus with diabetic chronic kidney disease: Secondary | ICD-10-CM | POA: Diagnosis present

## 2018-05-25 DIAGNOSIS — R160 Hepatomegaly, not elsewhere classified: Secondary | ICD-10-CM | POA: Diagnosis not present

## 2018-05-25 DIAGNOSIS — J90 Pleural effusion, not elsewhere classified: Secondary | ICD-10-CM | POA: Diagnosis not present

## 2018-05-25 DIAGNOSIS — K529 Noninfective gastroenteritis and colitis, unspecified: Secondary | ICD-10-CM | POA: Diagnosis not present

## 2018-05-27 DIAGNOSIS — D631 Anemia in chronic kidney disease: Secondary | ICD-10-CM | POA: Diagnosis not present

## 2018-05-27 DIAGNOSIS — D509 Iron deficiency anemia, unspecified: Secondary | ICD-10-CM | POA: Diagnosis not present

## 2018-05-27 DIAGNOSIS — N2581 Secondary hyperparathyroidism of renal origin: Secondary | ICD-10-CM | POA: Diagnosis not present

## 2018-05-27 DIAGNOSIS — Z992 Dependence on renal dialysis: Secondary | ICD-10-CM | POA: Diagnosis not present

## 2018-05-27 DIAGNOSIS — N186 End stage renal disease: Secondary | ICD-10-CM | POA: Diagnosis not present

## 2018-05-28 IMAGING — DX DG CHEST 2V
2 series · 2 of 2 positions shown · non-contrast
Comparison: Chest radiograph 12/16/2015.

CLINICAL DATA: Patient with shortness of breath, fever and nausea.

EXAM:
CHEST  2 VIEW

[chest pa]
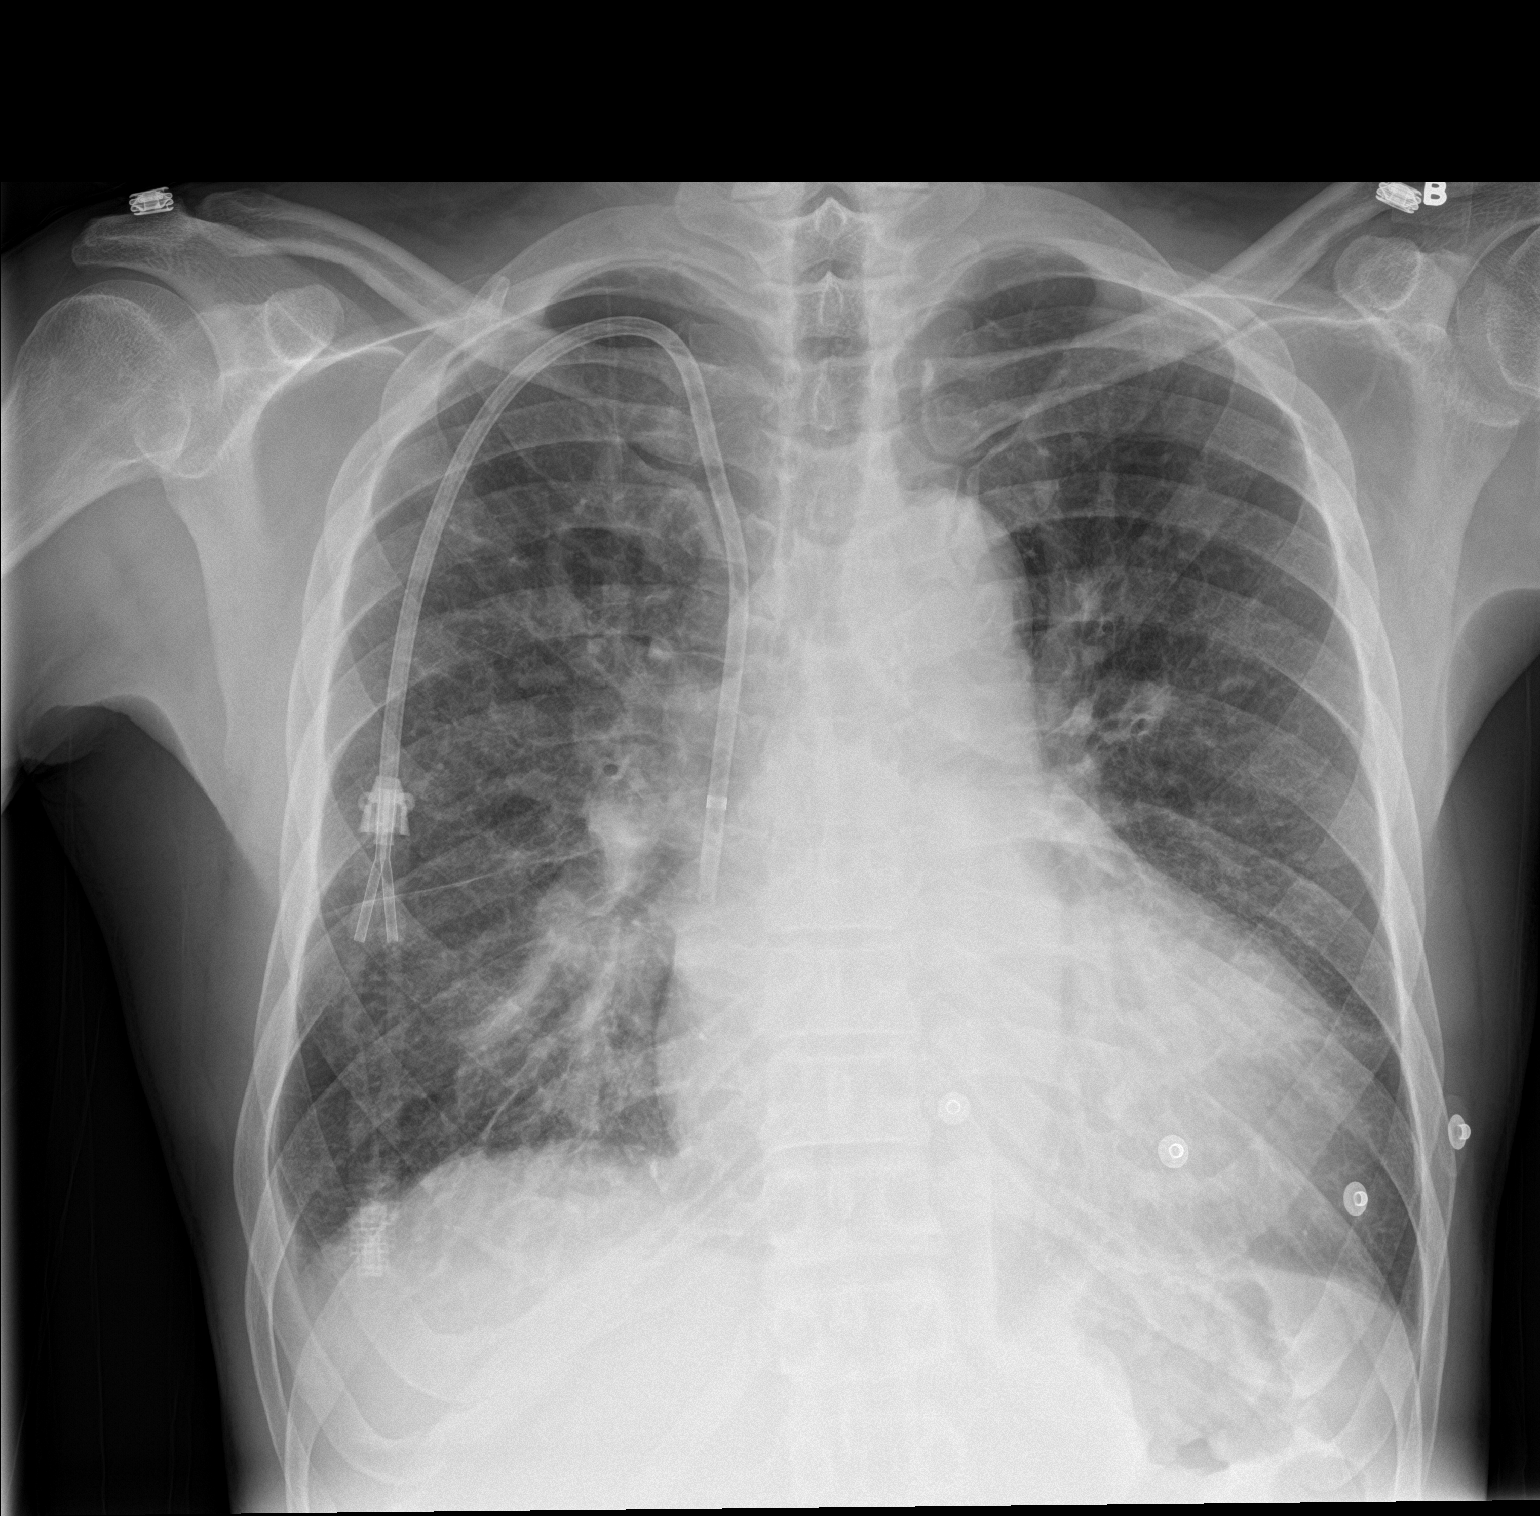

[chest lat]
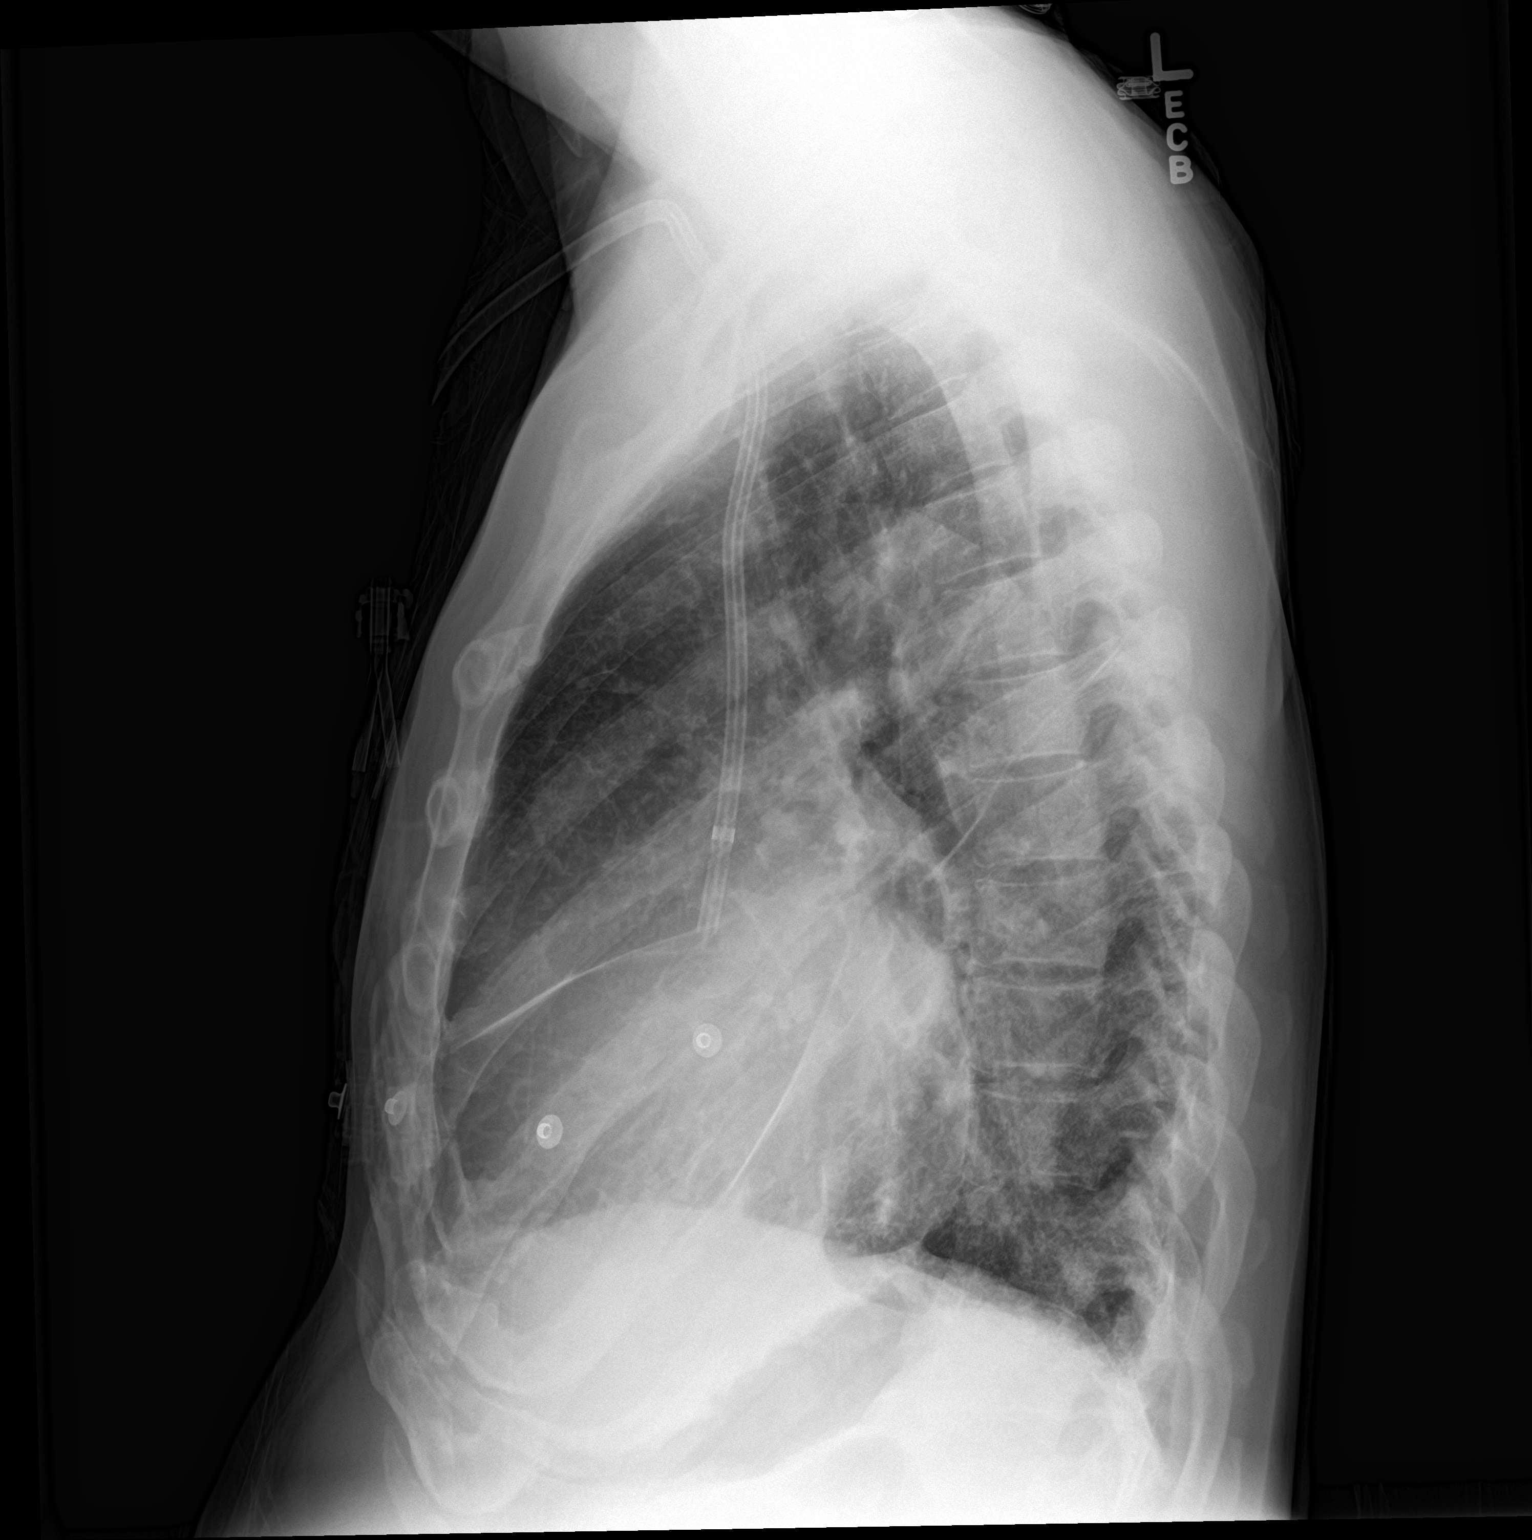

[2 of 2 positions shown; findings below may reference images not displayed]

FINDINGS: Central venous catheter tip projects over the superior vena cava.
Stable marked cardiomegaly. Pulmonary vascular redistribution and
bilateral perihilar interstitial pulmonary opacities. Mild pleural
thickening. No definite pleural effusion. Regional skeleton is
unremarkable.
IMPRESSION: Cardiomegaly and mild interstitial pulmonary edema.

## 2018-05-29 DIAGNOSIS — N2581 Secondary hyperparathyroidism of renal origin: Secondary | ICD-10-CM | POA: Diagnosis not present

## 2018-05-29 DIAGNOSIS — D509 Iron deficiency anemia, unspecified: Secondary | ICD-10-CM | POA: Diagnosis not present

## 2018-05-29 DIAGNOSIS — N186 End stage renal disease: Secondary | ICD-10-CM | POA: Diagnosis not present

## 2018-05-29 DIAGNOSIS — D631 Anemia in chronic kidney disease: Secondary | ICD-10-CM | POA: Diagnosis not present

## 2018-05-29 DIAGNOSIS — Z992 Dependence on renal dialysis: Secondary | ICD-10-CM | POA: Diagnosis not present

## 2018-06-01 DIAGNOSIS — Z79899 Other long term (current) drug therapy: Secondary | ICD-10-CM | POA: Diagnosis not present

## 2018-06-01 DIAGNOSIS — M5440 Lumbago with sciatica, unspecified side: Secondary | ICD-10-CM | POA: Diagnosis not present

## 2018-06-01 DIAGNOSIS — G8929 Other chronic pain: Secondary | ICD-10-CM | POA: Diagnosis not present

## 2018-06-01 DIAGNOSIS — M545 Low back pain: Secondary | ICD-10-CM | POA: Diagnosis not present

## 2018-06-02 ENCOUNTER — Encounter (HOSPITAL_COMMUNITY): Payer: Self-pay | Admitting: Emergency Medicine

## 2018-06-02 ENCOUNTER — Emergency Department (HOSPITAL_COMMUNITY): Payer: Medicare Other

## 2018-06-02 ENCOUNTER — Other Ambulatory Visit: Payer: Self-pay

## 2018-06-02 ENCOUNTER — Observation Stay (HOSPITAL_COMMUNITY)
Admission: EM | Admit: 2018-06-02 | Discharge: 2018-06-04 | Disposition: A | Discharge status: Home / Self Care | Payer: Medicare Other | Attending: Internal Medicine | Admitting: Internal Medicine

## 2018-06-02 DIAGNOSIS — F1120 Opioid dependence, uncomplicated: Secondary | ICD-10-CM | POA: Insufficient documentation

## 2018-06-02 DIAGNOSIS — Z7901 Long term (current) use of anticoagulants: Secondary | ICD-10-CM | POA: Insufficient documentation

## 2018-06-02 DIAGNOSIS — I120 Hypertensive chronic kidney disease with stage 5 chronic kidney disease or end stage renal disease: Secondary | ICD-10-CM | POA: Diagnosis not present

## 2018-06-02 DIAGNOSIS — I10 Essential (primary) hypertension: Secondary | ICD-10-CM | POA: Diagnosis present

## 2018-06-02 DIAGNOSIS — D631 Anemia in chronic kidney disease: Secondary | ICD-10-CM | POA: Diagnosis not present

## 2018-06-02 DIAGNOSIS — Z91158 Patient's noncompliance with renal dialysis for other reason: Secondary | ICD-10-CM

## 2018-06-02 DIAGNOSIS — K861 Other chronic pancreatitis: Secondary | ICD-10-CM | POA: Diagnosis not present

## 2018-06-02 DIAGNOSIS — E8889 Other specified metabolic disorders: Secondary | ICD-10-CM | POA: Insufficient documentation

## 2018-06-02 DIAGNOSIS — I82A11 Acute embolism and thrombosis of right axillary vein: Secondary | ICD-10-CM

## 2018-06-02 DIAGNOSIS — N2581 Secondary hyperparathyroidism of renal origin: Secondary | ICD-10-CM | POA: Insufficient documentation

## 2018-06-02 DIAGNOSIS — Z79899 Other long term (current) drug therapy: Secondary | ICD-10-CM | POA: Insufficient documentation

## 2018-06-02 DIAGNOSIS — E162 Hypoglycemia, unspecified: Secondary | ICD-10-CM | POA: Diagnosis not present

## 2018-06-02 DIAGNOSIS — I5042 Chronic combined systolic (congestive) and diastolic (congestive) heart failure: Secondary | ICD-10-CM | POA: Diagnosis not present

## 2018-06-02 DIAGNOSIS — G8929 Other chronic pain: Secondary | ICD-10-CM | POA: Insufficient documentation

## 2018-06-02 DIAGNOSIS — Z66 Do not resuscitate: Secondary | ICD-10-CM | POA: Diagnosis not present

## 2018-06-02 DIAGNOSIS — E875 Hyperkalemia: Principal | ICD-10-CM | POA: Insufficient documentation

## 2018-06-02 DIAGNOSIS — E8770 Fluid overload, unspecified: Secondary | ICD-10-CM | POA: Diagnosis not present

## 2018-06-02 DIAGNOSIS — I132 Hypertensive heart and chronic kidney disease with heart failure and with stage 5 chronic kidney disease, or end stage renal disease: Secondary | ICD-10-CM | POA: Diagnosis not present

## 2018-06-02 DIAGNOSIS — Z86718 Personal history of other venous thrombosis and embolism: Secondary | ICD-10-CM | POA: Diagnosis present

## 2018-06-02 DIAGNOSIS — Z87891 Personal history of nicotine dependence: Secondary | ICD-10-CM | POA: Insufficient documentation

## 2018-06-02 DIAGNOSIS — E1122 Type 2 diabetes mellitus with diabetic chronic kidney disease: Secondary | ICD-10-CM | POA: Insufficient documentation

## 2018-06-02 DIAGNOSIS — N281 Cyst of kidney, acquired: Secondary | ICD-10-CM | POA: Diagnosis not present

## 2018-06-02 DIAGNOSIS — Z992 Dependence on renal dialysis: Secondary | ICD-10-CM | POA: Diagnosis not present

## 2018-06-02 DIAGNOSIS — Z86711 Personal history of pulmonary embolism: Secondary | ICD-10-CM | POA: Insufficient documentation

## 2018-06-02 DIAGNOSIS — F418 Other specified anxiety disorders: Secondary | ICD-10-CM | POA: Diagnosis not present

## 2018-06-02 DIAGNOSIS — R9431 Abnormal electrocardiogram [ECG] [EKG]: Secondary | ICD-10-CM | POA: Diagnosis not present

## 2018-06-02 DIAGNOSIS — I2510 Atherosclerotic heart disease of native coronary artery without angina pectoris: Secondary | ICD-10-CM | POA: Insufficient documentation

## 2018-06-02 DIAGNOSIS — R0602 Shortness of breath: Secondary | ICD-10-CM | POA: Diagnosis not present

## 2018-06-02 DIAGNOSIS — Z9119 Patient's noncompliance with other medical treatment and regimen: Secondary | ICD-10-CM | POA: Insufficient documentation

## 2018-06-02 DIAGNOSIS — N186 End stage renal disease: Secondary | ICD-10-CM | POA: Insufficient documentation

## 2018-06-02 DIAGNOSIS — R109 Unspecified abdominal pain: Secondary | ICD-10-CM | POA: Diagnosis not present

## 2018-06-02 DIAGNOSIS — R451 Restlessness and agitation: Secondary | ICD-10-CM | POA: Diagnosis not present

## 2018-06-02 DIAGNOSIS — E1159 Type 2 diabetes mellitus with other circulatory complications: Secondary | ICD-10-CM | POA: Diagnosis present

## 2018-06-02 DIAGNOSIS — K219 Gastro-esophageal reflux disease without esophagitis: Secondary | ICD-10-CM | POA: Diagnosis not present

## 2018-06-02 DIAGNOSIS — Z9115 Patient's noncompliance with renal dialysis: Secondary | ICD-10-CM | POA: Diagnosis not present

## 2018-06-02 DIAGNOSIS — E11649 Type 2 diabetes mellitus with hypoglycemia without coma: Secondary | ICD-10-CM | POA: Diagnosis not present

## 2018-06-02 LAB — CBC WITH DIFFERENTIAL/PLATELET
Abs Immature Granulocytes: 0.03 10*3/uL (ref 0.00–0.07)
BASOS PCT: 1 %
Basophils Absolute: 0 10*3/uL (ref 0.0–0.1)
Eosinophils Absolute: 0.1 10*3/uL (ref 0.0–0.5)
Eosinophils Relative: 4 %
HCT: 27 % — ABNORMAL LOW (ref 39.0–52.0)
Hemoglobin: 7.9 g/dL — ABNORMAL LOW (ref 13.0–17.0)
Immature Granulocytes: 1 %
Lymphocytes Relative: 14 %
Lymphs Abs: 0.6 10*3/uL — ABNORMAL LOW (ref 0.7–4.0)
MCH: 25.1 pg — AB (ref 26.0–34.0)
MCHC: 29.3 g/dL — ABNORMAL LOW (ref 30.0–36.0)
MCV: 85.7 fL (ref 80.0–100.0)
MONO ABS: 0.6 10*3/uL (ref 0.1–1.0)
MONOS PCT: 16 %
Neutro Abs: 2.6 10*3/uL (ref 1.7–7.7)
Neutrophils Relative %: 64 %
PLATELETS: 336 10*3/uL (ref 150–400)
RBC: 3.15 MIL/uL — ABNORMAL LOW (ref 4.22–5.81)
RDW: 16.4 % — AB (ref 11.5–15.5)
WBC: 4 10*3/uL (ref 4.0–10.5)
nRBC: 0 % (ref 0.0–0.2)

## 2018-06-02 LAB — COMPREHENSIVE METABOLIC PANEL
ALT: 12 U/L (ref 0–44)
AST: 24 U/L (ref 15–41)
Albumin: 2.8 g/dL — ABNORMAL LOW (ref 3.5–5.0)
Alkaline Phosphatase: 122 U/L (ref 38–126)
Anion gap: 19 — ABNORMAL HIGH (ref 5–15)
BILIRUBIN TOTAL: 1.2 mg/dL (ref 0.3–1.2)
BUN: 82 mg/dL — ABNORMAL HIGH (ref 6–20)
CHLORIDE: 96 mmol/L — AB (ref 98–111)
CO2: 18 mmol/L — ABNORMAL LOW (ref 22–32)
CREATININE: 13.45 mg/dL — AB (ref 0.61–1.24)
Calcium: 8 mg/dL — ABNORMAL LOW (ref 8.9–10.3)
GFR calc Af Amer: 4 mL/min — ABNORMAL LOW (ref >60)
GFR, EST NON AFRICAN AMERICAN: 4 mL/min — AB (ref >60)
Glucose, Bld: 64 mg/dL — ABNORMAL LOW (ref 70–99)
Potassium: 6.7 mmol/L (ref 3.5–5.1)
Sodium: 133 mmol/L — ABNORMAL LOW (ref 135–145)
TOTAL PROTEIN: 8.2 g/dL — AB (ref 6.5–8.1)

## 2018-06-02 LAB — I-STAT CHEM 8, ED
BUN: 75 mg/dL — ABNORMAL HIGH (ref 6–20)
CREATININE: 14.6 mg/dL — AB (ref 0.61–1.24)
Calcium, Ion: 0.83 mmol/L — CL (ref 1.15–1.40)
Chloride: 99 mmol/L (ref 98–111)
GLUCOSE: 64 mg/dL — AB (ref 70–99)
HCT: 27 % — ABNORMAL LOW (ref 39.0–52.0)
HEMOGLOBIN: 9.2 g/dL — AB (ref 13.0–17.0)
POTASSIUM: 6.7 mmol/L — AB (ref 3.5–5.1)
Sodium: 132 mmol/L — ABNORMAL LOW (ref 135–145)
TCO2: 23 mmol/L (ref 22–32)

## 2018-06-02 LAB — CBG MONITORING, ED
GLUCOSE-CAPILLARY: 137 mg/dL — AB (ref 70–99)
GLUCOSE-CAPILLARY: 41 mg/dL — AB (ref 70–99)
Glucose-Capillary: 61 mg/dL — ABNORMAL LOW (ref 70–99)

## 2018-06-02 LAB — LIPASE, BLOOD: LIPASE: 24 U/L (ref 11–51)

## 2018-06-02 MED ORDER — NEPHRO-VITE RX 1 MG PO TABS: 1.0000 | ORAL_TABLET | Freq: Every day | ORAL | Status: DC

## 2018-06-02 MED ORDER — LIDOCAINE-PRILOCAINE 2.5-2.5 % EX CREA
1.0000 "application " | TOPICAL_CREAM | CUTANEOUS | Status: DC | PRN
  Filled 2018-06-02: qty 5

## 2018-06-02 MED ORDER — GABAPENTIN 300 MG PO CAPS
300.0000 mg | ORAL_CAPSULE | Freq: Every evening | ORAL | Status: DC
  Administered 2018-06-03: 300 mg via ORAL
  Filled 2018-06-02: qty 1

## 2018-06-02 MED ORDER — LANTHANUM CARBONATE 500 MG PO CHEW
1000.0000 mg | CHEWABLE_TABLET | Freq: Three times a day (TID) | ORAL | Status: DC
  Administered 2018-06-03 – 2018-06-04 (×2): 1000 mg via ORAL
  Filled 2018-06-02 (×3): qty 2

## 2018-06-02 MED ORDER — LIDOCAINE HCL (PF) 1 % IJ SOLN: 5.0000 mL | INTRAMUSCULAR | Status: DC | PRN

## 2018-06-02 MED ORDER — ONDANSETRON HCL 4 MG/2ML IJ SOLN: 4.0000 mg | Freq: Four times a day (QID) | INTRAMUSCULAR | Status: DC | PRN

## 2018-06-02 MED ORDER — SODIUM CHLORIDE 0.9 % IV SOLN: 100.0000 mL | INTRAVENOUS | Status: DC | PRN

## 2018-06-02 MED ORDER — ALPRAZOLAM 0.5 MG PO TABS
ORAL_TABLET | ORAL | Status: AC
  Filled 2018-06-02: qty 1

## 2018-06-02 MED ORDER — DEXTROSE 50 % IV SOLN
1.0000 | Freq: Once | INTRAVENOUS | Status: AC
  Administered 2018-06-02: 50 mL via INTRAVENOUS
  Filled 2018-06-02: qty 50

## 2018-06-02 MED ORDER — HYDROMORPHONE HCL 1 MG/ML IJ SOLN
1.0000 mg | Freq: Once | INTRAMUSCULAR | Status: AC
  Administered 2018-06-02: 1 mg via INTRAMUSCULAR
  Filled 2018-06-02: qty 1

## 2018-06-02 MED ORDER — HYDROXYZINE HCL 10 MG PO TABS
10.0000 mg | ORAL_TABLET | Freq: Three times a day (TID) | ORAL | Status: DC | PRN
  Administered 2018-06-02: 10 mg via ORAL
  Filled 2018-06-02 (×2): qty 1

## 2018-06-02 MED ORDER — ONDANSETRON HCL 4 MG/2ML IJ SOLN
4.0000 mg | Freq: Once | INTRAMUSCULAR | Status: AC
  Administered 2018-06-02: 4 mg via INTRAVENOUS

## 2018-06-02 MED ORDER — HYDRALAZINE HCL 50 MG PO TABS
100.0000 mg | ORAL_TABLET | Freq: Three times a day (TID) | ORAL | Status: DC
  Administered 2018-06-03 – 2018-06-04 (×2): 100 mg via ORAL
  Filled 2018-06-02 (×3): qty 2

## 2018-06-02 MED ORDER — APIXABAN 5 MG PO TABS
5.0000 mg | ORAL_TABLET | Freq: Two times a day (BID) | ORAL | Status: DC
  Administered 2018-06-03 (×2): 5 mg via ORAL
  Filled 2018-06-02 (×2): qty 1

## 2018-06-02 MED ORDER — ONDANSETRON HCL 4 MG PO TABS: 4.0000 mg | ORAL_TABLET | Freq: Four times a day (QID) | ORAL | Status: DC | PRN

## 2018-06-02 MED ORDER — ALPRAZOLAM 0.25 MG PO TABS
0.2500 mg | ORAL_TABLET | Freq: Two times a day (BID) | ORAL | Status: DC | PRN
  Filled 2018-06-02: qty 1

## 2018-06-02 MED ORDER — CALCIUM GLUCONATE 10 % IV SOLN
1.0000 g | Freq: Once | INTRAVENOUS | Status: AC
  Administered 2018-06-02: 1 g via INTRAVENOUS
  Filled 2018-06-02: qty 10

## 2018-06-02 MED ORDER — BUSPIRONE HCL 5 MG PO TABS
5.0000 mg | ORAL_TABLET | Freq: Two times a day (BID) | ORAL | Status: DC
  Administered 2018-06-03 (×2): 5 mg via ORAL
  Filled 2018-06-02 (×3): qty 1

## 2018-06-02 MED ORDER — SODIUM BICARBONATE 8.4 % IV SOLN
50.0000 meq | Freq: Once | INTRAVENOUS | Status: AC
  Administered 2018-06-02: 50 meq via INTRAVENOUS
  Filled 2018-06-02: qty 50

## 2018-06-02 MED ORDER — PANCRELIPASE (LIP-PROT-AMYL) 12000-38000 UNITS PO CPEP
12000.0000 [IU] | ORAL_CAPSULE | Freq: Three times a day (TID) | ORAL | Status: DC
  Administered 2018-06-03 – 2018-06-04 (×2): 12000 [IU] via ORAL
  Filled 2018-06-02 (×3): qty 1

## 2018-06-02 MED ORDER — HEPARIN SODIUM (PORCINE) 5000 UNIT/ML IJ SOLN: 5000.0000 [IU] | Freq: Three times a day (TID) | INTRAMUSCULAR | Status: DC

## 2018-06-02 MED ORDER — ONDANSETRON HCL 4 MG/2ML IJ SOLN
INTRAMUSCULAR | Status: AC
  Filled 2018-06-02: qty 2

## 2018-06-02 MED ORDER — ACETAMINOPHEN 650 MG RE SUPP: 650.0000 mg | Freq: Four times a day (QID) | RECTAL | Status: DC | PRN

## 2018-06-02 MED ORDER — DEXTROSE 10 % IV SOLN
Freq: Once | INTRAVENOUS | Status: AC
  Administered 2018-06-02: 20:00:00 via INTRAVENOUS

## 2018-06-02 MED ORDER — PROMETHAZINE HCL 25 MG PO TABS: 25.0000 mg | ORAL_TABLET | Freq: Four times a day (QID) | ORAL | Status: DC | PRN

## 2018-06-02 MED ORDER — ACETAMINOPHEN 325 MG PO TABS: 650.0000 mg | ORAL_TABLET | Freq: Four times a day (QID) | ORAL | Status: DC | PRN

## 2018-06-02 MED ORDER — OXYCODONE HCL 5 MG PO TABS
10.0000 mg | ORAL_TABLET | Freq: Four times a day (QID) | ORAL | Status: DC | PRN
  Administered 2018-06-03 – 2018-06-04 (×3): 10 mg via ORAL
  Filled 2018-06-02 (×3): qty 2

## 2018-06-02 MED ORDER — RENA-VITE PO TABS
1.0000 | ORAL_TABLET | Freq: Every day | ORAL | Status: DC
  Administered 2018-06-03: 1 via ORAL
  Filled 2018-06-02: qty 1

## 2018-06-02 MED ORDER — DARBEPOETIN ALFA 200 MCG/0.4ML IJ SOSY
200.0000 ug | PREFILLED_SYRINGE | INTRAMUSCULAR | Status: DC
  Administered 2018-06-03: 200 ug via INTRAVENOUS
  Filled 2018-06-02: qty 0.4

## 2018-06-02 MED ORDER — POLYETHYLENE GLYCOL 3350 17 G PO PACK: 17.0000 g | PACK | Freq: Every day | ORAL | Status: DC

## 2018-06-02 MED ORDER — LISINOPRIL 10 MG PO TABS: 5.0000 mg | ORAL_TABLET | Freq: Every day | ORAL | Status: DC

## 2018-06-02 MED ORDER — PANTOPRAZOLE SODIUM 40 MG PO TBEC
40.0000 mg | DELAYED_RELEASE_TABLET | Freq: Every day | ORAL | Status: DC
  Administered 2018-06-03: 40 mg via ORAL
  Filled 2018-06-02: qty 1

## 2018-06-02 MED ORDER — ONDANSETRON HCL 4 MG PO TABS: 8.0000 mg | ORAL_TABLET | Freq: Three times a day (TID) | ORAL | Status: DC | PRN

## 2018-06-02 MED ORDER — NEPHRO VITAMINS 0.8 MG PO TABS: 1.0000 | ORAL_TABLET | Freq: Every day | ORAL | Status: DC

## 2018-06-02 MED ORDER — INSULIN ASPART 100 UNIT/ML IV SOLN
5.0000 [IU] | Freq: Once | INTRAVENOUS | Status: AC
  Administered 2018-06-02: 5 [IU] via INTRAVENOUS
  Filled 2018-06-02: qty 0.05

## 2018-06-02 MED ORDER — DOXERCALCIFEROL 4 MCG/2ML IV SOLN
5.0000 ug | Freq: Once | INTRAVENOUS | Status: AC
  Administered 2018-06-03: 5 ug via INTRAVENOUS

## 2018-06-02 MED ORDER — ZOLPIDEM TARTRATE 5 MG PO TABS
5.0000 mg | ORAL_TABLET | Freq: Every day | ORAL | Status: DC
  Administered 2018-06-03: 5 mg via ORAL
  Filled 2018-06-02: qty 1

## 2018-06-02 MED ORDER — DICYCLOMINE HCL 10 MG PO CAPS: 10.0000 mg | ORAL_CAPSULE | Freq: Four times a day (QID) | ORAL | Status: DC | PRN

## 2018-06-02 MED ORDER — SENNOSIDES-DOCUSATE SODIUM 8.6-50 MG PO TABS
2.0000 | ORAL_TABLET | Freq: Every day | ORAL | Status: DC
  Administered 2018-06-03: 2 via ORAL
  Filled 2018-06-02: qty 2

## 2018-06-02 MED ORDER — MORPHINE SULFATE (PF) 4 MG/ML IV SOLN
4.0000 mg | Freq: Once | INTRAVENOUS | Status: AC
  Administered 2018-06-02: 4 mg via INTRAVENOUS
  Filled 2018-06-02: qty 1

## 2018-06-02 MED ORDER — IOHEXOL 300 MG/ML  SOLN
100.0000 mL | Freq: Once | INTRAMUSCULAR | Status: AC | PRN
  Administered 2018-06-02: 100 mL via INTRAVENOUS

## 2018-06-02 MED ORDER — AMLODIPINE BESYLATE 5 MG PO TABS: 5.0000 mg | ORAL_TABLET | Freq: Every day | ORAL | Status: DC

## 2018-06-02 MED ORDER — ALPRAZOLAM 0.25 MG PO TABS
0.5000 mg | ORAL_TABLET | Freq: Once | ORAL | Status: AC
  Administered 2018-06-02: 0.5 mg via ORAL

## 2018-06-02 MED ORDER — PENTAFLUOROPROP-TETRAFLUOROETH EX AERO
1.0000 "application " | INHALATION_SPRAY | CUTANEOUS | Status: DC | PRN
  Filled 2018-06-02: qty 116

## 2018-06-02 MED ORDER — CINACALCET HCL 30 MG PO TABS
90.0000 mg | ORAL_TABLET | Freq: Every evening | ORAL | Status: DC
  Administered 2018-06-03: 90 mg via ORAL
  Filled 2018-06-02: qty 3

## 2018-06-02 MED ORDER — CHLORHEXIDINE GLUCONATE CLOTH 2 % EX PADS: 6.0000 | MEDICATED_PAD | Freq: Every day | CUTANEOUS | Status: DC

## 2018-06-02 MED ORDER — ONDANSETRON 4 MG PO TBDP
4.0000 mg | ORAL_TABLET | Freq: Once | ORAL | Status: AC
  Administered 2018-06-02: 4 mg via ORAL
  Filled 2018-06-02: qty 1

## 2018-06-02 MED ORDER — CARVEDILOL 25 MG PO TABS
25.0000 mg | ORAL_TABLET | Freq: Two times a day (BID) | ORAL | Status: DC
  Administered 2018-06-03 – 2018-06-04 (×2): 25 mg via ORAL
  Filled 2018-06-02 (×3): qty 1

## 2018-06-02 NOTE — ED Notes (Signed)
IV attempted x's 2 without success
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
## 2018-06-02 NOTE — ED Provider Notes (Signed)
Medical screening examination/treatment/procedure(s) were conducted as a shared visit with non-physician practitioner(s) and myself.  I personally evaluated the patient during the encounter.   Here with missed dialysis. No ecg changes.  Found to have hyperkalemia. Treatment given.  Admit.   Angiocath insertion Performed by: Merrily Pew  Consent: Verbal consent obtained. Risks and benefits: risks, benefits and alternatives were discussed Time out: Immediately prior to procedure a "time out" was called to verify the correct patient, procedure, equipment, support staff and site/side marked as required.  Preparation: Patient was prepped and draped in the usual sterile fashion.  Vein Location: Right forearm  Ultrasound Guided  Gauge: 20  Normal blood return and flush without difficulty Patient tolerance: Patient tolerated the procedure well with no immediate complications.   EKG Interpretation  Date/Time:  Tuesday June 02 2018 16:59:12 EST Ventricular Rate:  91 PR Interval:    QRS Duration: 91 QT Interval:  402 QTC Calculation: 495 R Axis:   -32 Text Interpretation:  Sinus rhythm Borderline prolonged PR interval Borderline low voltage, extremity leads Left ventricular hypertrophy Nonspecific T abnrm, anterolateral leads Borderline prolonged QT interval No significant change since last tracing Confirmed by Merrily Pew [phone]) on 06/02/2018 5:17:40 PM     Rilynne Lonsway, Corene Cornea, MD 06/02/18 2318

## 2018-06-02 NOTE — ED Notes (Signed)
When patient arrived from CT - infusion of D-10 had been paused (hence the blood sugar). D-10 restarted and report given to Dialysis to continue D-10 infusion until otherwise ordered.

## 2018-06-02 NOTE — ED Notes (Addendum)
CBG 61. RN in room notified.

## 2018-06-02 NOTE — ED Notes (Signed)
Pt resting quietly at this time with eyes closed

## 2018-06-02 NOTE — ED Notes (Signed)
IV in to attempt access.  Pt refuses and st's he needs a ultrasound IV.  MD made aware

## 2018-06-02 NOTE — Progress Notes (Signed)
VAST RN to ER to evaluate pt for PIV placement. ER RN reported pt is a non-compliant dialysis pt who last received dialysis on Friday, 11/08. He is left arm restricted and has a lot of scarring in his right arm. ER nurses attempted IV placement several times. Upon entering pt's room he asked, "where is the machine"? VAST RN asked pt if [place] is usually used to place his PIV and he stated "yes". VAST RN asked pt if assessment could be made and he stated, "no. I want someone to bring the machine." Ensured patient someone would be back shortly.

## 2018-06-02 NOTE — ED Triage Notes (Signed)
Pt to ED with c/o shortness of breath.,  Pt st's his last dialysis was on Friday.  Pt st's they were not able to remove all the fluid and the shortness of breath started Friday night.

## 2018-06-02 NOTE — ED Notes (Signed)
Port CXR at bedside
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
## 2018-06-03 DIAGNOSIS — Z9115 Patient's noncompliance with renal dialysis: Secondary | ICD-10-CM | POA: Diagnosis not present

## 2018-06-03 DIAGNOSIS — Z992 Dependence on renal dialysis: Secondary | ICD-10-CM | POA: Diagnosis not present

## 2018-06-03 DIAGNOSIS — D631 Anemia in chronic kidney disease: Secondary | ICD-10-CM | POA: Diagnosis not present

## 2018-06-03 DIAGNOSIS — N2581 Secondary hyperparathyroidism of renal origin: Secondary | ICD-10-CM | POA: Diagnosis not present

## 2018-06-03 DIAGNOSIS — N186 End stage renal disease: Secondary | ICD-10-CM | POA: Diagnosis not present

## 2018-06-03 DIAGNOSIS — I120 Hypertensive chronic kidney disease with stage 5 chronic kidney disease or end stage renal disease: Secondary | ICD-10-CM | POA: Diagnosis not present

## 2018-06-03 LAB — GLUCOSE, CAPILLARY
GLUCOSE-CAPILLARY: 87 mg/dL (ref 70–99)
GLUCOSE-CAPILLARY: 92 mg/dL (ref 70–99)
GLUCOSE-CAPILLARY: 94 mg/dL (ref 70–99)
GLUCOSE-CAPILLARY: 99 mg/dL (ref 70–99)
Glucose-Capillary: 69 mg/dL — ABNORMAL LOW (ref 70–99)
Glucose-Capillary: 63 mg/dL — ABNORMAL LOW (ref 70–99)
Glucose-Capillary: 74 mg/dL (ref 70–99)
Glucose-Capillary: 109 mg/dL — ABNORMAL HIGH (ref 70–99)
Glucose-Capillary: 66 mg/dL — ABNORMAL LOW (ref 70–99)

## 2018-06-03 LAB — BASIC METABOLIC PANEL
Anion gap: 12 (ref 5–15)
BUN: 43 mg/dL — AB (ref 6–20)
CALCIUM: 8 mg/dL — AB (ref 8.9–10.3)
CO2: 25 mmol/L (ref 22–32)
CREATININE: 8.4 mg/dL — AB (ref 0.61–1.24)
Chloride: 97 mmol/L — ABNORMAL LOW (ref 98–111)
GFR calc Af Amer: 7 mL/min — ABNORMAL LOW (ref >60)
GFR, EST NON AFRICAN AMERICAN: 6 mL/min — AB (ref >60)
GLUCOSE: 118 mg/dL — AB (ref 70–99)
Potassium: 4.2 mmol/L (ref 3.5–5.1)
Sodium: 134 mmol/L — ABNORMAL LOW (ref 135–145)

## 2018-06-03 MED ORDER — DOXERCALCIFEROL 4 MCG/2ML IV SOLN
INTRAVENOUS | Status: AC
  Filled 2018-06-03: qty 4

## 2018-06-03 MED ORDER — PENTAFLUOROPROP-TETRAFLUOROETH EX AERO: 1.0000 "application " | INHALATION_SPRAY | CUTANEOUS | Status: DC | PRN

## 2018-06-03 MED ORDER — SODIUM CHLORIDE 0.9 % IV SOLN: 100.0000 mL | INTRAVENOUS | Status: DC | PRN

## 2018-06-03 MED ORDER — DARBEPOETIN ALFA 200 MCG/0.4ML IJ SOSY
PREFILLED_SYRINGE | INTRAMUSCULAR | Status: AC
  Filled 2018-06-03: qty 0.4

## 2018-06-03 MED ORDER — LIDOCAINE HCL (PF) 1 % IJ SOLN: 5.0000 mL | INTRAMUSCULAR | Status: DC | PRN

## 2018-06-03 MED ORDER — LIDOCAINE-PRILOCAINE 2.5-2.5 % EX CREA: 1.0000 "application " | TOPICAL_CREAM | CUTANEOUS | Status: DC | PRN

## 2018-06-03 NOTE — Progress Notes (Signed)
Subjective:  No co, no abd pain ,sitting in chair fixing peanut butter sandwich. Agreed to 2.5 hr hd to keep on MWF schedule today , had hd  Late last pm   Objective Vital signs in last 24 hours: Vitals:   06/03/18 0130 06/03/18 0140 06/03/18 0202 06/03/18 0442  BP: 135/84 131/82 (!) 160/113 124/86  Pulse: 92 94 92 81  Resp: _0 Temp:  97.8 F (36.6 C) 98.3 F (36.8 C) (!) 97.5 F (36.4 C)  TempSrc:  Oral Oral Oral  SpO2: 94% 95% 95% 100%  Weight:      Height:       Weight change:   Physical Exam: General: Alert AAM , NAD  OX3  Heart: RRR , no m,r, g Lungs: CTA bilat  Abdomen: BS pos  And nl, soft .NT Extremities: no pedal edema  Dialysis Access: pos bruit AVF      OP Dialyzes  [place] 4 hrs  F 180 dialyzer 2K/ 2.25 Ca bath BFR 400/ DFR 800 L FA AVF EDW 76 kg  venofer 100 mg q week mircera 225 q 2 weeks hectorol 5 mcg q rx No heparin  Problem/Plan: 1. Abd pain= resolved, Ho Chronic Pancreatitis (possibly admit symptomatic by Missing hd ) wu per admit  2. Hyperkalemia = secondary to missed HD ,labs pre hd today  3. ESRD = normal MWF HD schedule HO Noncompliance 4.  HTN/volume - bp 124/86  After hd,  bp meds  5. Anemia - hgb 7.2>9.2  aranesp 200  ordered not given yet  6. Secondary hyperparathyroidism - hect on hd / lanthanum carb. as binder/ sensipar   7. disposition = possible dc after hd if stable ?   Ernest Haber, PA-C [facility] Beeper [phone] 06/03/2018,12:09 PM  LOS: 0 days   Labs: Basic Metabolic Panel: Recent Labs  Lab 06/02/18 1711 06/02/18 1834  NA 133* 132*  K 6.7* 6.7*  CL 96* 99  CO2 18*  --   GLUCOSE 64* 64*  BUN 82* 75*  CREATININE 13.45* 14.60*  CALCIUM 8.0*  --    Liver Function Tests: Recent Labs  Lab 06/02/18 1711  AST 24  ALT 12  ALKPHOS 122  BILITOT 1.2  PROT 8.2*  ALBUMIN 2.8*   Recent Labs  Lab 06/02/18 1711  LIPASE 24   No results for input(s): AMMONIA in the last 168  hours. CBC: Recent Labs  Lab 06/02/18 1711 06/02/18 1834  WBC 4.0  --   NEUTROABS 2.6  --   HGB 7.9* 9.2*  HCT 27.0* 27.0*  MCV 85.7  --   PLT 336  --    Cardiac Enzymes: No results for input(s): CKTOTAL, CKMB, CKMBINDEX, TROPONINI in the last 168 hours. CBG: Recent Labs  Lab 06/03/18 0244 06/03/18 0527 06/03/18 0554 06/03/18 0745 06/03/18 1132  GLUCAP 99 63* 87 74 109*    Medications: . sodium chloride    . sodium chloride     . amLODipine  5 mg Oral Daily  . apixaban  5 mg Oral BID  . busPIRone  5 mg Oral BID  . carvedilol  25 mg Oral BID WC  . Chlorhexidine Gluconate Cloth  6 each Topical Q0600  . cinacalcet  90 mg Oral QPM  . darbepoetin (ARANESP) injection - DIALYSIS  200 mcg Intravenous Q Wed-HD  . doxercalciferol      . gabapentin  300 mg Oral QPM  . hydrALAZINE  100 mg Oral Q8H  . lanthanum  1,000 mg Oral TID WC  . lipase/protease/amylase  12,000 Units Oral TID AC  . lisinopril  5 mg Oral Daily  . multivitamin  1 tablet Oral QHS  . pantoprazole  40 mg Oral Daily  . polyethylene glycol  17 g Oral Daily  . senna-docusate  2 tablet Oral QHS  . zolpidem  5 mg Oral QHS

## 2018-06-03 NOTE — Discharge Summary (Addendum)
Physician Discharge Summary  Frank [name] NOB:[id number] DOB: Jun 29, 1966 DOA: 06/02/2018  PCP: Patient, [name]  Admit date: 06/02/2018 Discharge date: 06/04/2018  Admitted From: home Disposition:  home  Addendum 11/14 Patient refused to leave the hospital post HD last night because he was tired. Stable overnight, no complaints this morning. OK for d/c home  Recommendations for Outpatient Follow-up:  1. Follow up with PCP in 1-2 weeks 2. Follow up with HD as scheduled  Home Health: none Equipment/Devices: none  Discharge Condition: stable CODE STATUS: Full code Diet recommendation: renal  HPI: Per Dr. Alcario [name], Frank [name] is a 52 y.o. male with medical history significant of ESRD, non-compliance with dialysis, chronic abd pain due to chronic pancreatitis likely with component of opiate dependence.  HTN, chronic CHF EF 35-40%, PE on eliquis. He last dialyzed on Friday, May 29, 2018. He states that they were unable to draw off all of the fluid at that time. The patient did not make his other dialysis appointments. He began feeling extremely short of breath today and came in for evaluation. ED Course: Also has abd pain c/w chronic pancreatitis, better after ED gave him IV dilaudid. K 6.7, BUN 82.  Given temporary measures for K and sent for urgent HD tonight and again tomorrow.  Hospital Course: Hyperkalemia, hypervolemia due to ESRD noncompliance - nephrology was consulted and patient underwent HD x 2 with improvement in his K levels. D/w Dr. Jonnie [name], [place] to discharge after today's HD session. Respiratory status is at baseline.  Chronic abdominal pain - chronic pancreatitis / opiate addiction. Avoid escalation in narcotics. CT scan of the abdomen and pelvis without acute findings Concern for partial consolidation of the right lower lung lobe - no respiratory symptoms, afebrile, no leukocytosis, likely atelectasis  History of DVT/PE - continue eliquis HTN - continue home  medications Concern for indolent renal cell carcinoma - at least since 2015, outpatient follow up  Discharge Diagnoses:  Principal Problem:   Noncompliance of patient with renal dialysis [facility]) Active Problems:   Essential hypertension   DVT (deep venous thrombosis) ([place])   Chronic pancreatitis ([facility])   Hypoglycemia   Discharge Instructions  Allergies as of 06/04/2018      Reactions   Butalbital-apap-caffeine Shortness Of Breath, Swelling, Other (See Comments)   Swelling in throat   Ferrlecit [na Ferric Gluc Cplx In Sucrose] Shortness Of Breath, Swelling, Other (See Comments)   Swelling in throat, tolerates Venofor   Minoxidil Shortness Of Breath   Tylenol [acetaminophen] Anaphylaxis, Swelling   Darvocet [propoxyphene N-acetaminophen] Hives      Medication List    TAKE these medications   ALPRAZolam 0.5 MG tablet Commonly known as:  XANAX Take 0.5 tablets (0.25 mg total) by mouth 2 (two) times daily as needed for anxiety or sleep.   amLODipine 5 MG tablet Commonly known as:  NORVASC Take 5 mg by mouth daily.   busPIRone 5 MG tablet Commonly known as:  BUSPAR Take 5 mg by mouth 2 (two) times daily.   carvedilol 25 MG tablet Commonly known as:  COREG Take 25 mg by mouth 2 (two) times daily with a meal.   cinacalcet 90 MG tablet Commonly known as:  SENSIPAR Take 90 mg by mouth every evening.   diclofenac sodium 1 % Gel Commonly known as:  VOLTAREN Apply 4 gram transdermaly four times daily for left shoulder pain   dicyclomine 10 MG/5ML syrup Commonly known as:  BENTYL Take 5 mLs by mouth 4 (  four) times daily as needed for spasms.   ELIQUIS 5 MG Tabs tablet Generic drug:  apixaban Take 5 mg by mouth 2 (two) times daily.   gabapentin 300 MG capsule Commonly known as:  NEURONTIN Take 300 mg by mouth every evening.   hydrALAZINE 100 MG tablet Commonly known as:  APRESOLINE Take 100 mg by mouth 3 (three) times daily.   hydrOXYzine 10 MG tablet Commonly  known as:  ATARAX/VISTARIL Take 1 tablet (10 mg total) by mouth 3 (three) times daily as needed for anxiety, nausea or vomiting.   lanthanum 1000 MG chewable tablet Commonly known as:  FOSRENOL Chew 1,000 mg by mouth 3 (three) times daily with meals.   lipase/protease/amylase 12000 units Cpep capsule Commonly known as:  CREON Take 1 capsule (12,000 Units total) by mouth 3 (three) times daily before meals.   lisinopril 5 MG tablet Commonly known as:  PRINIVIL,ZESTRIL Take 5 mg by mouth daily.   multivitamin Tabs tablet Take 1 tablet by mouth daily.   B complex-vitamin C-folic acid 1 MG tablet Take 1 tablet by mouth at bedtime.   NEPHRO VITAMINS 0.8 MG Tabs Take 1 tablet by mouth daily.   nitroGLYCERIN 0.4 MG SL tablet Commonly known as:  NITROSTAT Place 0.4 mg under the tongue every 5 (five) minutes as needed for chest pain.   NUTRITIONAL SUPPLEMENT PO Liberalized Renal Diet - Regular Texture   ondansetron 8 MG tablet Commonly known as:  ZOFRAN Take 1 tablet (8 mg total) by mouth every 8 (eight) hours as needed for nausea or vomiting.   OXYGEN O2 via nasal cannula at 2L/min for O2 sats 90% or below as needed for shortness of breath   pantoprazole 40 MG tablet Commonly known as:  PROTONIX Take 1 tablet (40 mg total) by mouth daily.   polyethylene glycol packet Commonly known as:  MIRALAX / GLYCOLAX Take 17 g by mouth daily.   promethazine 25 MG tablet Commonly known as:  PHENERGAN Take 25 mg by mouth every 6 (six) hours as needed for nausea or vomiting.   senna-docusate 8.6-50 MG tablet Commonly known as:  Senokot-S Take 2 tablets by mouth at bedtime.   zolpidem 5 MG tablet Commonly known as:  AMBIEN Take 1 tablet (5 mg total) by mouth at bedtime.      Consultations:  Nephrology   Procedures/Studies:  HD  Ct Abdomen Pelvis W Contrast  Result Date: 06/02/2018 CLINICAL DATA:  Acute onset of shortness of breath. Patient on dialysis. EXAM: CT ABDOMEN  AND PELVIS WITH CONTRAST TECHNIQUE: Multidetector CT imaging of the abdomen and pelvis was performed using the standard protocol following bolus administration of intravenous contrast. CONTRAST:  135m OMNIPAQUE IOHEXOL 300 MG/ML  SOLN COMPARISON:  CT of the abdomen and pelvis performed 05/09/2018 FINDINGS: Lower chest: A moderate right-sided pleural effusion is noted, with partial consolidation of the right lower lobe. A small pericardial effusion is identified. Hepatobiliary: The liver is unremarkable in appearance. The gallbladder is unremarkable in appearance. The common bile duct remains normal in caliber. Pancreas: The pancreas is within normal limits. Spleen: The spleen is unremarkable in appearance. Adrenals/Urinary Tract: The adrenal glands are unremarkable in appearance. There is a mildly heterogeneous 3.1 x 3.1 cm mass arising at the interpole region of the left kidney, raising suspicion for indolent renal cell carcinoma. It has increased in size from 2.3 cm in 2015, though it has decreased slightly in size since 2018. Severe bilateral renal atrophy is again noted. A left renal cyst  is also seen. There is no evidence of hydronephrosis. No renal or ureteral stones are identified. Stomach/Bowel: The stomach is unremarkable in appearance. The small bowel is within normal limits. The appendix is not visualized; there is no evidence for appendicitis. The colon is unremarkable in appearance. Vascular/Lymphatic: Diffuse calcification is seen along the abdominal aorta and its branches. There is mild-to-moderate luminal narrowing at the proximal left common iliac artery, with associated minimal aneurysmal dilatation of the left common iliac artery to 2.2 cm in diameter. The inferior vena cava is grossly unremarkable. No retroperitoneal lymphadenopathy is seen. No pelvic sidewall lymphadenopathy is identified. Reproductive: The soft tissue mass with vascular calcifications at the left iliac fossa reflects an  atrophied transplant kidney. The bladder is decompressed and not well characterized. The prostate remains normal in size. Other: No additional soft tissue abnormalities are seen. Musculoskeletal: No acute osseous abnormalities are identified. The visualized musculature is unremarkable in appearance. IMPRESSION: 1. Moderate right-sided pleural effusion, with partial consolidation of the right lower lung lobe. This may explain the patient's shortness of breath. 2. Mildly heterogeneous 3.1 x 3.1 cm mass arising at the interpole region of the left kidney, raising suspicion for indolent renal cell carcinoma. It has increased in size from 2.3 cm in 2015, though it has decreased slightly in size since 2018. 3. Severe bilateral native renal atrophy, and atrophy of the transplant kidney at the left iliac fossa. Left renal cyst. 4. Mild to moderate luminal narrowing at the proximal left common iliac artery, with associated minimal aneurysmal dilatation of the left common iliac artery to 2.2 cm in diameter. 5. Small pericardial effusion noted. Aortic Atherosclerosis (ICD10-I70.0). Electronically Signed   By: Garald Balding M.D.   On: 06/02/2018 22:43   Ct Abdomen Pelvis W Contrast  Result Date: 05/09/2018 CLINICAL DATA:  Acute generalized abdominal pain. EXAM: CT ABDOMEN AND PELVIS WITH CONTRAST TECHNIQUE: Multidetector CT imaging of the abdomen and pelvis was performed using the standard protocol following bolus administration of intravenous contrast. CONTRAST:  111m OMNIPAQUE IOHEXOL 300 MG/ML  SOLN COMPARISON:  CT scan of February 04, 2018. FINDINGS: Lower chest: Large right pleural effusion is noted with adjacent atelectasis. Hepatobiliary: No focal liver abnormality is seen. No gallstones, gallbladder wall thickening, or biliary dilatation. Pancreas: Unremarkable. No pancreatic ductal dilatation or surrounding inflammatory changes. Spleen: Normal in size without focal abnormality. Adrenals/Urinary Tract: Adrenal glands  appear normal. Bilateral renal atrophy is noted consistent with history of end-stage renal disease. Stable 3.1 cm solid mass arising from upper pole of left kidney. Urinary bladder is decompressed. Stomach/Bowel: The stomach appears normal. There is no evidence of bowel obstruction or inflammation. The appendix is not visualized. Vascular/Lymphatic: Aortic atherosclerosis. No enlarged abdominal or pelvic lymph nodes. Reproductive: Prostate is unremarkable. Other: Failed renal transplant is noted in left lower quadrant. No hernia or ascites is noted. Musculoskeletal: Increased diffuse sclerosis is noted consistent with renal osteodystrophy. IMPRESSION: Large right pleural effusion with adjacent atelectasis of right lower lobe. Bilateral renal atrophy is noted consistent with end-stage renal disease. Stable 3.1 cm solid mass arising from upper pole of left kidney. Possible neoplasm cannot be excluded. Failed renal transplant seen in left lower quadrant. Aortic Atherosclerosis (ICD10-I70.0). Electronically Signed   By: JMarijo Conception M.D.   On: 05/09/2018 20:34   Dg Chest Port 1 View  Result Date: 06/02/2018 CLINICAL DATA:  Shortness of breath EXAM: PORTABLE CHEST 1 VIEW COMPARISON:  05/10/2018 FINDINGS: Unchanged moderate cardiomegaly. Right pleural effusion is increased in size. There  is associated basilar atelectasis. No pneumothorax. IMPRESSION: Increased size of right pleural effusion. Electronically Signed   By: Ulyses Jarred M.D.   On: 06/02/2018 19:12   Dg Abd Acute W/chest  Result Date: 05/10/2018 CLINICAL DATA:  Upper abdominal pain, nausea vomiting. EXAM: DG ABDOMEN ACUTE W/ 1V CHEST COMPARISON:  Body CT 05/09/2018 FINDINGS: Large right pleural effusion with right lower lobe atelectasis versus airspace consolidation. Prominence of the vascular markings bilaterally. Enlarged cardiac silhouette. Calcific atherosclerotic disease of the aorta. Nonobstructive nonspecific bowel gas pattern with  relative paucity of small bowel gas and preserved colonic bowel gas pattern. IMPRESSION: Nonobstructive nonspecific bowel gas pattern. Large right pleural effusion with pulmonary vascular congestion. Enlarged cardiac silhouette. Electronically Signed   By: Fidela Salisbury M.D.   On: 05/10/2018 16:28    Subjective: - no chest pain, shortness of breath, no abdominal pain, nausea or vomiting.   Discharge Exam: Vitals:   06/03/18 1648 06/03/18 1800  BP: 137/87 (!) 157/108  Pulse: 89 96  Resp: 18 18  Temp: 98.6 F (37 C) 98.9 F (37.2 C)  SpO2: 100% 92%    General: Pt is alert, awake, not in acute distress Cardiovascular: RRR, S1/S2 +, no rubs, no gallops Respiratory: CTA bilaterally, no wheezing, no rhonchi Abdominal: Soft, NT, ND, bowel sounds + Extremities: no edema, no cyanosis  The results of significant diagnostics from this hospitalization (including imaging, microbiology, ancillary and laboratory) are listed below for reference.     Microbiology: No results found for this or any previous visit (from the past 240 hour(s)).   Labs: BNP (last 3 results) Recent Labs    06/13/17 0126 12/01/17 1101 02/09/18 1049  BNP >4,500.0* >4,500.0* >0,932.3*   Basic Metabolic Panel: Recent Labs  Lab 06/02/18 1711 06/02/18 1834 06/03/18 1428  NA 133* 132* 134*  K 6.7* 6.7* 4.2  CL 96* 99 97*  CO2 18*  --  25  GLUCOSE 64* 64* 118*  BUN 82* 75* 43*  CREATININE 13.45* 14.60* 8.40*  CALCIUM 8.0*  --  8.0*   Liver Function Tests: Recent Labs  Lab 06/02/18 1711  AST 24  ALT 12  ALKPHOS 122  BILITOT 1.2  PROT 8.2*  ALBUMIN 2.8*   Recent Labs  Lab 06/02/18 1711  LIPASE 24   No results for input(s): AMMONIA in the last 168 hours. CBC: Recent Labs  Lab 06/02/18 1711 06/02/18 1834  WBC 4.0  --   NEUTROABS 2.6  --   HGB 7.9* 9.2*  HCT 27.0* 27.0*  MCV 85.7  --   PLT 336  --    Cardiac Enzymes: No results for input(s): CKTOTAL, CKMB, CKMBINDEX, TROPONINI in  the last 168 hours. BNP: Invalid input(s): POCBNP CBG: Recent Labs  Lab 06/03/18 0554 06/03/18 0745 06/03/18 1132 06/03/18 1746 06/04/18 0410  GLUCAP 87 74 109* 66* 76   D-Dimer No results for input(s): DDIMER in the last 72 hours. Hgb A1c No results for input(s): HGBA1C in the last 72 hours. Lipid Profile No results for input(s): CHOL, HDL, LDLCALC, TRIG, CHOLHDL, LDLDIRECT in the last 72 hours. Thyroid function studies No results for input(s): TSH, T4TOTAL, T3FREE, THYROIDAB in the last 72 hours.  Invalid input(s): FREET3 Anemia work up No results for input(s): VITAMINB12, FOLATE, FERRITIN, TIBC, IRON, RETICCTPCT in the last 72 hours. Urinalysis    Component Value Date/Time   COLORURINE YELLOW 10/18/2013 [place] 10/18/2013 0419   LABSPEC 1.008 10/18/2013 0419   PHURINE 8.5 (H) 10/18/2013 0419  GLUCOSEU 100 (A) 10/18/2013 0419   HGBUR TRACE (A) 10/18/2013 0419   BILIRUBINUR NEGATIVE 10/18/2013 0419   KETONESUR NEGATIVE 10/18/2013 0419   PROTEINUR 100 (A) 10/18/2013 0419   UROBILINOGEN 0.2 10/18/2013 0419   NITRITE NEGATIVE 10/18/2013 0419   LEUKOCYTESUR NEGATIVE 10/18/2013 0419   Sepsis Labs Invalid input(s): PROCALCITONIN,  WBC,  LACTICIDVEN   Time coordinating discharge: 45 minutes  SIGNED:  Marzetta Board, MD  [facility] 06/04/2018, 7:46 AM Pager [phone]  If 7PM-7AM, please contact night-coverage [email or web address] Password [email or web address]

## 2018-06-03 NOTE — Discharge Instructions (Signed)

## 2018-06-03 NOTE — Progress Notes (Signed)
Patient refused skin assessment. "I'm going to sleep.". Frank [name], [name], Therapist, sports

## 2018-06-03 NOTE — Progress Notes (Signed)
New Admission Note:   Arrival Method: Patient arrived from Hemodialysis via stretcher Mental Orientation: Alert and oriented x4 Telemetry: Box #9 Assessment: Completed Skin: Patient refused skin assessment IV: Rt FA Pain: Denies Tubes:N/A Safety Measures: Safety Fall Prevention Plan has been given, discussed and signed Admission: Completed 5MW Orientation: Patient has been orientated to the room, unit and staff.  Family:None at bedside  Orders have been reviewed and implemented. Will continue to monitor the patient. Call light has been placed within reach.   Marquita Lias [employer], RN-BC Phone number: [phone]

## 2018-06-03 NOTE — Progress Notes (Signed)
Patient came back on the unit after HD. Went into patient's room to D/C him home, and he stated, " I'm not going  home. I'm not ready. I'm weak and tired." Paged the doctor to let him know. Will continue to monitor.   Farley Ly RN

## 2018-06-03 NOTE — Progress Notes (Signed)
Hypoglycemic Event  CBG: 63  Treatment: 15 GM carbohydrate snack  Symptoms: None  Follow-up CBG: [id number] CBG Result:87  Possible Reasons for Event: Unknown  Comments/MD notified:Per hypoglycemia protocol    Asanti Craigo Joselita

## 2018-06-03 NOTE — Progress Notes (Signed)
Hypoglycemic Event  CBG: 69  Treatment: 15 GM carbohydrate snack  Symptoms: None  Follow-up CBG: Time:0244 CBG Result:99  Possible Reasons for Event: Unknown  Comments/MD notified:per hypoglycemia protocol    Jarrod Bodkins Joselita

## 2018-06-03 NOTE — Progress Notes (Signed)
Patient refused having vitals taken by NT. Will continue to monitor.   Farley Ly RN

## 2018-06-04 LAB — GLUCOSE, CAPILLARY: GLUCOSE-CAPILLARY: 76 mg/dL (ref 70–99)

## 2018-06-04 NOTE — Care Management Obs Status (Signed)
[place] NOTIFICATION   Patient Details  Name: Frank [name] MRN: [id number] Date of Birth: July 19, 1966   Medicare Observation Status Notification Given:  Yes    Carles Collet, RN 06/04/2018, 9:39 AM

## 2018-06-04 NOTE — Progress Notes (Signed)
House keeping Frank [name]) handed me the discharged papers that the patient left behind intentionally?

## 2018-06-05 DIAGNOSIS — Z86711 Personal history of pulmonary embolism: Secondary | ICD-10-CM | POA: Diagnosis not present

## 2018-06-05 DIAGNOSIS — N186 End stage renal disease: Secondary | ICD-10-CM | POA: Diagnosis not present

## 2018-06-05 DIAGNOSIS — Z87892 Personal history of anaphylaxis: Secondary | ICD-10-CM | POA: Diagnosis not present

## 2018-06-05 DIAGNOSIS — J90 Pleural effusion, not elsewhere classified: Secondary | ICD-10-CM | POA: Diagnosis not present

## 2018-06-05 DIAGNOSIS — I120 Hypertensive chronic kidney disease with stage 5 chronic kidney disease or end stage renal disease: Secondary | ICD-10-CM | POA: Diagnosis not present

## 2018-06-05 DIAGNOSIS — I132 Hypertensive heart and chronic kidney disease with heart failure and with stage 5 chronic kidney disease, or end stage renal disease: Secondary | ICD-10-CM | POA: Diagnosis not present

## 2018-06-05 DIAGNOSIS — R112 Nausea with vomiting, unspecified: Secondary | ICD-10-CM | POA: Diagnosis not present

## 2018-06-05 DIAGNOSIS — E1122 Type 2 diabetes mellitus with diabetic chronic kidney disease: Secondary | ICD-10-CM | POA: Diagnosis not present

## 2018-06-05 DIAGNOSIS — Z992 Dependence on renal dialysis: Secondary | ICD-10-CM | POA: Diagnosis not present

## 2018-06-05 DIAGNOSIS — K861 Other chronic pancreatitis: Secondary | ICD-10-CM | POA: Diagnosis not present

## 2018-06-05 DIAGNOSIS — Z888 Allergy status to other drugs, medicaments and biological substances status: Secondary | ICD-10-CM | POA: Diagnosis not present

## 2018-06-05 DIAGNOSIS — J9811 Atelectasis: Secondary | ICD-10-CM | POA: Diagnosis not present

## 2018-06-05 DIAGNOSIS — R109 Unspecified abdominal pain: Secondary | ICD-10-CM | POA: Diagnosis not present

## 2018-06-05 DIAGNOSIS — I2723 Pulmonary hypertension due to lung diseases and hypoxia: Secondary | ICD-10-CM | POA: Diagnosis not present

## 2018-06-05 DIAGNOSIS — N261 Atrophy of kidney (terminal): Secondary | ICD-10-CM | POA: Diagnosis not present

## 2018-06-05 DIAGNOSIS — I517 Cardiomegaly: Secondary | ICD-10-CM | POA: Diagnosis not present

## 2018-06-05 DIAGNOSIS — Z8249 Family history of ischemic heart disease and other diseases of the circulatory system: Secondary | ICD-10-CM | POA: Diagnosis not present

## 2018-06-05 DIAGNOSIS — I509 Heart failure, unspecified: Secondary | ICD-10-CM | POA: Diagnosis not present

## 2018-06-05 DIAGNOSIS — R0602 Shortness of breath: Secondary | ICD-10-CM | POA: Diagnosis not present

## 2018-06-05 DIAGNOSIS — G4733 Obstructive sleep apnea (adult) (pediatric): Secondary | ICD-10-CM | POA: Diagnosis not present

## 2018-06-05 DIAGNOSIS — J811 Chronic pulmonary edema: Secondary | ICD-10-CM | POA: Diagnosis not present

## 2018-06-05 DIAGNOSIS — G8929 Other chronic pain: Secondary | ICD-10-CM | POA: Diagnosis not present

## 2018-06-05 DIAGNOSIS — D631 Anemia in chronic kidney disease: Secondary | ICD-10-CM | POA: Diagnosis not present

## 2018-06-05 DIAGNOSIS — Z833 Family history of diabetes mellitus: Secondary | ICD-10-CM | POA: Diagnosis not present

## 2018-06-05 DIAGNOSIS — Z79899 Other long term (current) drug therapy: Secondary | ICD-10-CM | POA: Diagnosis not present

## 2018-06-05 DIAGNOSIS — I5022 Chronic systolic (congestive) heart failure: Secondary | ICD-10-CM | POA: Diagnosis not present

## 2018-06-05 DIAGNOSIS — R918 Other nonspecific abnormal finding of lung field: Secondary | ICD-10-CM | POA: Diagnosis not present

## 2018-06-05 DIAGNOSIS — R1084 Generalized abdominal pain: Secondary | ICD-10-CM | POA: Diagnosis not present

## 2018-06-05 DIAGNOSIS — Z86718 Personal history of other venous thrombosis and embolism: Secondary | ICD-10-CM | POA: Diagnosis not present

## 2018-06-05 DIAGNOSIS — Z9115 Patient's noncompliance with renal dialysis: Secondary | ICD-10-CM | POA: Diagnosis not present

## 2018-06-05 DIAGNOSIS — N2889 Other specified disorders of kidney and ureter: Secondary | ICD-10-CM | POA: Diagnosis not present

## 2018-06-06 DIAGNOSIS — D631 Anemia in chronic kidney disease: Secondary | ICD-10-CM | POA: Diagnosis not present

## 2018-06-06 DIAGNOSIS — I5022 Chronic systolic (congestive) heart failure: Secondary | ICD-10-CM | POA: Diagnosis not present

## 2018-06-06 DIAGNOSIS — R1084 Generalized abdominal pain: Secondary | ICD-10-CM | POA: Diagnosis not present

## 2018-06-06 DIAGNOSIS — K219 Gastro-esophageal reflux disease without esophagitis: Secondary | ICD-10-CM | POA: Diagnosis not present

## 2018-06-06 DIAGNOSIS — K861 Other chronic pancreatitis: Secondary | ICD-10-CM | POA: Diagnosis not present

## 2018-06-06 DIAGNOSIS — N2889 Other specified disorders of kidney and ureter: Secondary | ICD-10-CM | POA: Diagnosis not present

## 2018-06-06 DIAGNOSIS — I10 Essential (primary) hypertension: Secondary | ICD-10-CM | POA: Diagnosis not present

## 2018-06-06 DIAGNOSIS — N186 End stage renal disease: Secondary | ICD-10-CM | POA: Diagnosis not present

## 2018-06-06 DIAGNOSIS — R112 Nausea with vomiting, unspecified: Secondary | ICD-10-CM | POA: Diagnosis not present

## 2018-06-06 DIAGNOSIS — Z992 Dependence on renal dialysis: Secondary | ICD-10-CM | POA: Diagnosis not present

## 2018-06-06 DIAGNOSIS — Z9119 Patient's noncompliance with other medical treatment and regimen: Secondary | ICD-10-CM | POA: Diagnosis not present

## 2018-06-06 DIAGNOSIS — Z940 Kidney transplant status: Secondary | ICD-10-CM | POA: Diagnosis not present

## 2018-06-07 DIAGNOSIS — I509 Heart failure, unspecified: Secondary | ICD-10-CM | POA: Diagnosis not present

## 2018-06-07 DIAGNOSIS — I10 Essential (primary) hypertension: Secondary | ICD-10-CM | POA: Diagnosis not present

## 2018-06-07 DIAGNOSIS — R918 Other nonspecific abnormal finding of lung field: Secondary | ICD-10-CM | POA: Diagnosis not present

## 2018-06-07 DIAGNOSIS — N186 End stage renal disease: Secondary | ICD-10-CM | POA: Diagnosis not present

## 2018-06-07 DIAGNOSIS — D631 Anemia in chronic kidney disease: Secondary | ICD-10-CM | POA: Diagnosis not present

## 2018-06-07 DIAGNOSIS — I517 Cardiomegaly: Secondary | ICD-10-CM | POA: Diagnosis not present

## 2018-06-07 DIAGNOSIS — R112 Nausea with vomiting, unspecified: Secondary | ICD-10-CM | POA: Diagnosis not present

## 2018-06-07 DIAGNOSIS — J90 Pleural effusion, not elsewhere classified: Secondary | ICD-10-CM | POA: Diagnosis not present

## 2018-06-07 DIAGNOSIS — J9811 Atelectasis: Secondary | ICD-10-CM | POA: Diagnosis not present

## 2018-06-07 DIAGNOSIS — Z992 Dependence on renal dialysis: Secondary | ICD-10-CM | POA: Diagnosis not present

## 2018-06-08 ENCOUNTER — Encounter (HOSPITAL_COMMUNITY): Payer: Self-pay

## 2018-06-08 ENCOUNTER — Emergency Department (HOSPITAL_COMMUNITY)
Admission: EM | Admit: 2018-06-08 | Discharge: 2018-06-09 | Disposition: A | Discharge status: Home / Self Care | Payer: Medicare Other | Attending: Emergency Medicine | Admitting: Emergency Medicine

## 2018-06-08 DIAGNOSIS — R0602 Shortness of breath: Secondary | ICD-10-CM | POA: Insufficient documentation

## 2018-06-08 DIAGNOSIS — I10 Essential (primary) hypertension: Secondary | ICD-10-CM | POA: Diagnosis not present

## 2018-06-08 DIAGNOSIS — Z992 Dependence on renal dialysis: Secondary | ICD-10-CM | POA: Diagnosis not present

## 2018-06-08 DIAGNOSIS — Z87891 Personal history of nicotine dependence: Secondary | ICD-10-CM | POA: Insufficient documentation

## 2018-06-08 DIAGNOSIS — R1010 Upper abdominal pain, unspecified: Secondary | ICD-10-CM | POA: Diagnosis not present

## 2018-06-08 DIAGNOSIS — R112 Nausea with vomiting, unspecified: Secondary | ICD-10-CM | POA: Diagnosis not present

## 2018-06-08 DIAGNOSIS — E1122 Type 2 diabetes mellitus with diabetic chronic kidney disease: Secondary | ICD-10-CM | POA: Diagnosis not present

## 2018-06-08 DIAGNOSIS — R109 Unspecified abdominal pain: Secondary | ICD-10-CM | POA: Diagnosis not present

## 2018-06-08 DIAGNOSIS — Z79899 Other long term (current) drug therapy: Secondary | ICD-10-CM | POA: Insufficient documentation

## 2018-06-08 DIAGNOSIS — R197 Diarrhea, unspecified: Secondary | ICD-10-CM | POA: Diagnosis not present

## 2018-06-08 DIAGNOSIS — N186 End stage renal disease: Secondary | ICD-10-CM | POA: Diagnosis not present

## 2018-06-08 DIAGNOSIS — R1084 Generalized abdominal pain: Secondary | ICD-10-CM | POA: Insufficient documentation

## 2018-06-08 DIAGNOSIS — I132 Hypertensive heart and chronic kidney disease with heart failure and with stage 5 chronic kidney disease, or end stage renal disease: Secondary | ICD-10-CM | POA: Diagnosis not present

## 2018-06-08 DIAGNOSIS — Z7901 Long term (current) use of anticoagulants: Secondary | ICD-10-CM | POA: Insufficient documentation

## 2018-06-08 DIAGNOSIS — D631 Anemia in chronic kidney disease: Secondary | ICD-10-CM | POA: Diagnosis not present

## 2018-06-08 DIAGNOSIS — I5042 Chronic combined systolic (congestive) and diastolic (congestive) heart failure: Secondary | ICD-10-CM | POA: Insufficient documentation

## 2018-06-08 LAB — COMPREHENSIVE METABOLIC PANEL
ALT: 10 U/L (ref 0–44)
AST: 20 U/L (ref 15–41)
Albumin: 2.5 g/dL — ABNORMAL LOW (ref 3.5–5.0)
Alkaline Phosphatase: 100 U/L (ref 38–126)
Anion gap: 11 (ref 5–15)
BUN: 58 mg/dL — ABNORMAL HIGH (ref 6–20)
CO2: 26 mmol/L (ref 22–32)
Calcium: 7.1 mg/dL — ABNORMAL LOW (ref 8.9–10.3)
Chloride: 99 mmol/L (ref 98–111)
Creatinine, Ser: 11.83 mg/dL — ABNORMAL HIGH (ref 0.61–1.24)
GFR calc Af Amer: 5 mL/min — ABNORMAL LOW (ref >60)
GFR calc non Af Amer: 4 mL/min — ABNORMAL LOW (ref >60)
Glucose, Bld: 132 mg/dL — ABNORMAL HIGH (ref 70–99)
Potassium: 5.3 mmol/L — ABNORMAL HIGH (ref 3.5–5.1)
Sodium: 136 mmol/L (ref 135–145)
Total Bilirubin: 0.7 mg/dL (ref 0.3–1.2)
Total Protein: 7.5 g/dL (ref 6.5–8.1)

## 2018-06-08 LAB — CBC
HCT: 25.9 % — ABNORMAL LOW (ref 39.0–52.0)
Hemoglobin: 7.5 g/dL — ABNORMAL LOW (ref 13.0–17.0)
MCH: 24.8 pg — ABNORMAL LOW (ref 26.0–34.0)
MCHC: 29 g/dL — ABNORMAL LOW (ref 30.0–36.0)
MCV: 85.5 fL (ref 80.0–100.0)
Platelets: 217 10*3/uL (ref 150–400)
RBC: 3.03 MIL/uL — ABNORMAL LOW (ref 4.22–5.81)
RDW: 16.8 % — ABNORMAL HIGH (ref 11.5–15.5)
WBC: 3.3 10*3/uL — ABNORMAL LOW (ref 4.0–10.5)
nRBC: 0 % (ref 0.0–0.2)

## 2018-06-08 LAB — CBG MONITORING, ED
Glucose-Capillary: 33 mg/dL — CL (ref 70–99)
Glucose-Capillary: 116 mg/dL — ABNORMAL HIGH (ref 70–99)
Glucose-Capillary: 72 mg/dL (ref 70–99)

## 2018-06-08 LAB — LIPASE, BLOOD: Lipase: 21 U/L (ref 11–51)

## 2018-06-08 MED ORDER — MELATONIN 1 MG PO TABS
2.00 | ORAL_TABLET | ORAL | Status: DC
Start: ? — End: 2018-06-08

## 2018-06-08 MED ORDER — HYDRALAZINE HCL 20 MG/ML IJ SOLN
10.00 | INTRAMUSCULAR | Status: DC
Start: ? — End: 2018-06-08

## 2018-06-08 MED ORDER — DIPHENHYDRAMINE HCL 50 MG/ML IJ SOLN
12.50 | INTRAMUSCULAR | Status: DC
Start: ? — End: 2018-06-08

## 2018-06-08 MED ORDER — GUAIFENESIN 100 MG/5ML PO SYRP
200.00 | ORAL_SOLUTION | ORAL | Status: DC
Start: ? — End: 2018-06-08

## 2018-06-08 MED ORDER — SENNA-DOCUSATE SODIUM 8.6-50 MG PO TABS
1.00 | ORAL_TABLET | ORAL | Status: DC
Start: ? — End: 2018-06-08

## 2018-06-08 MED ORDER — ALBUMIN HUMAN 25 % IV SOLN
12.50 | INTRAVENOUS | Status: DC
Start: ? — End: 2018-06-08

## 2018-06-08 MED ORDER — LIDOCAINE HCL (PF) 1 % IJ SOLN
0.10 | INTRAMUSCULAR | Status: DC
Start: ? — End: 2018-06-08

## 2018-06-08 MED ORDER — SODIUM CHLORIDE 0.9 % IJ SOLN
50.00 | INTRAMUSCULAR | Status: DC
Start: ? — End: 2018-06-08

## 2018-06-08 MED ORDER — CINACALCET HCL 30 MG PO TABS
90.00 | ORAL_TABLET | ORAL | Status: DC
Start: 2018-06-09 — End: 2018-06-08

## 2018-06-08 MED ORDER — SODIUM POLYSTYRENE SULFONATE 15 GM/60ML PO SUSP
60.0000 g | Freq: Once | ORAL | Status: AC
  Administered 2018-06-09: 60 g via ORAL
  Filled 2018-06-08: qty 240

## 2018-06-08 MED ORDER — ONDANSETRON HCL 4 MG/2ML IJ SOLN
4.00 | INTRAMUSCULAR | Status: DC
Start: ? — End: 2018-06-08

## 2018-06-08 MED ORDER — IPRATROPIUM-ALBUTEROL 0.5-2.5 (3) MG/3ML IN SOLN
3.00 | RESPIRATORY_TRACT | Status: DC
Start: ? — End: 2018-06-08

## 2018-06-08 MED ORDER — MANNITOL 25 % IV SOLN
12.50 | INTRAVENOUS | Status: DC
Start: ? — End: 2018-06-08

## 2018-06-08 MED ORDER — SODIUM CHLORIDE 0.9 % IV SOLN
150.00 | INTRAVENOUS | Status: DC
Start: ? — End: 2018-06-08

## 2018-06-08 MED ORDER — CARVEDILOL 25 MG PO TABS
25.00 | ORAL_TABLET | ORAL | Status: DC
Start: 2018-06-08 — End: 2018-06-08

## 2018-06-08 MED ORDER — PANTOPRAZOLE SODIUM 40 MG PO TBEC
40.00 | DELAYED_RELEASE_TABLET | ORAL | Status: DC
Start: 2018-06-08 — End: 2018-06-08

## 2018-06-08 MED ORDER — AMLODIPINE BESYLATE 5 MG PO TABS
5.00 | ORAL_TABLET | ORAL | Status: DC
Start: 2018-06-08 — End: 2018-06-08

## 2018-06-08 MED ORDER — PANCRELIPASE (LIP-PROT-AMYL) 12000-38000 UNITS PO CPEP
12000.00 | ORAL_CAPSULE | ORAL | Status: DC
Start: 2018-06-08 — End: 2018-06-08

## 2018-06-08 MED ORDER — MUPIROCIN 2 % EX OINT
TOPICAL_OINTMENT | CUTANEOUS | Status: DC
Start: 2018-06-08 — End: 2018-06-08

## 2018-06-08 MED ORDER — CLONIDINE HCL 0.1 MG PO TABS
0.10 | ORAL_TABLET | ORAL | Status: DC
Start: ? — End: 2018-06-08

## 2018-06-08 MED ORDER — OXYCODONE HCL 10 MG PO TABS
10.00 | ORAL_TABLET | ORAL | Status: DC
Start: ? — End: 2018-06-08

## 2018-06-08 MED ORDER — DEXTROSE 50 % IV SOLN
INTRAVENOUS | Status: AC
  Administered 2018-06-08: 22:00:00
  Filled 2018-06-08: qty 50

## 2018-06-08 NOTE — ED Notes (Signed)
Unable to establish peripheral IV access despite multiple attempts due to scarred/minute veins , IV team consult ordered.

## 2018-06-08 NOTE — ED Notes (Signed)
Pt. States he's 'aneuric'. Urine sample cannot be obtained.

## 2018-06-08 NOTE — ED Triage Notes (Signed)
Pt states that he has been having abd pain and vomiting all day long, reports five two six times. Denies CP, states that he believes his HR is dropping and would like and EKG as well. Last received dialysis on Saturday

## 2018-06-08 NOTE — ED Notes (Signed)
EDP attempted IV at right EJ but infiltrated , IV nurse at bedside at this time .
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
## 2018-06-08 NOTE — ED Notes (Addendum)
Dr. Wilson Singer notified on pt.'s hypoglycemia , 1 amp D50% IV given .

## 2018-06-09 ENCOUNTER — Emergency Department (HOSPITAL_COMMUNITY)
Admission: EM | Admit: 2018-06-09 | Discharge: 2018-06-09 | Disposition: A | Discharge status: Home / Self Care | Payer: Medicare Other | Source: Home / Self Care | Attending: Emergency Medicine | Admitting: Emergency Medicine

## 2018-06-09 ENCOUNTER — Encounter (HOSPITAL_COMMUNITY): Payer: Self-pay

## 2018-06-09 DIAGNOSIS — N186 End stage renal disease: Secondary | ICD-10-CM

## 2018-06-09 DIAGNOSIS — Z79899 Other long term (current) drug therapy: Secondary | ICD-10-CM | POA: Insufficient documentation

## 2018-06-09 DIAGNOSIS — Z992 Dependence on renal dialysis: Secondary | ICD-10-CM

## 2018-06-09 DIAGNOSIS — R1084 Generalized abdominal pain: Secondary | ICD-10-CM | POA: Insufficient documentation

## 2018-06-09 DIAGNOSIS — R112 Nausea with vomiting, unspecified: Secondary | ICD-10-CM

## 2018-06-09 DIAGNOSIS — I132 Hypertensive heart and chronic kidney disease with heart failure and with stage 5 chronic kidney disease, or end stage renal disease: Secondary | ICD-10-CM | POA: Insufficient documentation

## 2018-06-09 DIAGNOSIS — R0602 Shortness of breath: Secondary | ICD-10-CM | POA: Insufficient documentation

## 2018-06-09 DIAGNOSIS — I5042 Chronic combined systolic (congestive) and diastolic (congestive) heart failure: Secondary | ICD-10-CM | POA: Insufficient documentation

## 2018-06-09 DIAGNOSIS — Z87891 Personal history of nicotine dependence: Secondary | ICD-10-CM | POA: Insufficient documentation

## 2018-06-09 DIAGNOSIS — Z7901 Long term (current) use of anticoagulants: Secondary | ICD-10-CM

## 2018-06-09 DIAGNOSIS — E1122 Type 2 diabetes mellitus with diabetic chronic kidney disease: Secondary | ICD-10-CM | POA: Insufficient documentation

## 2018-06-09 LAB — COMPREHENSIVE METABOLIC PANEL
ALT: 10 U/L (ref 0–44)
AST: 17 U/L (ref 15–41)
Albumin: 2.5 g/dL — ABNORMAL LOW (ref 3.5–5.0)
Alkaline Phosphatase: 122 U/L (ref 38–126)
Anion gap: 12 (ref 5–15)
BUN: 70 mg/dL — ABNORMAL HIGH (ref 6–20)
CO2: 26 mmol/L (ref 22–32)
Calcium: 7.3 mg/dL — ABNORMAL LOW (ref 8.9–10.3)
Chloride: 100 mmol/L (ref 98–111)
Creatinine, Ser: 13.17 mg/dL — ABNORMAL HIGH (ref 0.61–1.24)
GFR calc Af Amer: 4 mL/min — ABNORMAL LOW (ref >60)
GFR calc non Af Amer: 4 mL/min — ABNORMAL LOW (ref >60)
Glucose, Bld: 102 mg/dL — ABNORMAL HIGH (ref 70–99)
Potassium: 5.6 mmol/L — ABNORMAL HIGH (ref 3.5–5.1)
Sodium: 138 mmol/L (ref 135–145)
Total Bilirubin: 0.8 mg/dL (ref 0.3–1.2)
Total Protein: 7.6 g/dL (ref 6.5–8.1)

## 2018-06-09 LAB — CBC WITH DIFFERENTIAL/PLATELET
Abs Immature Granulocytes: 0.03 10*3/uL (ref 0.00–0.07)
Basophils Absolute: 0 10*3/uL (ref 0.0–0.1)
Basophils Relative: 0 %
Eosinophils Absolute: 0.2 10*3/uL (ref 0.0–0.5)
Eosinophils Relative: 5 %
HCT: 25.2 % — ABNORMAL LOW (ref 39.0–52.0)
Hemoglobin: 7.6 g/dL — ABNORMAL LOW (ref 13.0–17.0)
Immature Granulocytes: 1 %
Lymphocytes Relative: 11 %
Lymphs Abs: 0.4 10*3/uL — ABNORMAL LOW (ref 0.7–4.0)
MCH: 25.8 pg — ABNORMAL LOW (ref 26.0–34.0)
MCHC: 30.2 g/dL (ref 30.0–36.0)
MCV: 85.4 fL (ref 80.0–100.0)
Monocytes Absolute: 0.7 10*3/uL (ref 0.1–1.0)
Monocytes Relative: 18 %
Neutro Abs: 2.5 10*3/uL (ref 1.7–7.7)
Neutrophils Relative %: 65 %
Platelets: 212 10*3/uL (ref 150–400)
RBC: 2.95 MIL/uL — ABNORMAL LOW (ref 4.22–5.81)
RDW: 16.8 % — ABNORMAL HIGH (ref 11.5–15.5)
WBC: 3.9 10*3/uL — ABNORMAL LOW (ref 4.0–10.5)
nRBC: 0 % (ref 0.0–0.2)

## 2018-06-09 LAB — LIPASE, BLOOD: Lipase: 21 U/L (ref 11–51)

## 2018-06-09 MED ORDER — OXYCODONE HCL 5 MG PO TABS
10.0000 mg | ORAL_TABLET | Freq: Once | ORAL | Status: AC
  Administered 2018-06-09: 10 mg via ORAL
  Filled 2018-06-09 (×2): qty 2

## 2018-06-09 MED ORDER — ONDANSETRON 4 MG PO TBDP: 4.0000 mg | ORAL_TABLET | Freq: Three times a day (TID) | ORAL | 0 refills | Status: DC | PRN

## 2018-06-09 MED ORDER — PROMETHAZINE HCL 25 MG/ML IJ SOLN
12.5000 mg | Freq: Once | INTRAMUSCULAR | Status: AC
  Administered 2018-06-09: 12.5 mg via INTRAVENOUS
  Filled 2018-06-09: qty 1

## 2018-06-09 MED ORDER — SODIUM POLYSTYRENE SULFONATE 15 GM/60ML PO SUSP
15.0000 g | Freq: Once | ORAL | Status: DC
  Filled 2018-06-09: qty 60

## 2018-06-09 MED ORDER — ONDANSETRON HCL 4 MG/2ML IJ SOLN
4.0000 mg | Freq: Once | INTRAMUSCULAR | Status: AC
  Administered 2018-06-09: 4 mg via INTRAVENOUS
  Filled 2018-06-09: qty 2

## 2018-06-09 NOTE — ED Notes (Signed)
Pt refused to go to xray

## 2018-06-09 NOTE — ED Notes (Signed)
Pt requesting something to eat... patient given warmed [place] sandwich and crackers

## 2018-06-09 NOTE — ED Notes (Signed)
Phlebotomy at bedside.

## 2018-06-09 NOTE — ED Triage Notes (Signed)
Pt c/o R sided lower abd pain, back pain, n/v, and sob that began 3-4 days ago; denies cp; last dialyzed Saturday

## 2018-06-09 NOTE — ED Notes (Signed)
Iv team at bedside  

## 2018-06-09 NOTE — Discharge Instructions (Addendum)
Start taking Zofran as prescribed for nausea.  Let this medicine dissolve under your tongue and wait around 20 to 30 minutes before having anything to eat or drink to get this medicine time to work.  Do not take Phenergan if you are going to take Zofran.  Continue to take your home medicines.  You do not require dialysis emergently tonight but you should go to dialysis tomorrow.  Follow-up with your primary care physician or gastroneurology for reevaluation of your nausea and vomiting.  Return to the emergency department if any concerning signs or symptoms develop.
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
## 2018-06-09 NOTE — ED Notes (Signed)
Pt refused PO pain medication stating he wants IV pain medication. This RN explained to the pt that the EDP is only going to give him his home dose of PO pain medication. Pt still refused, EDP made aware

## 2018-06-10 DIAGNOSIS — D631 Anemia in chronic kidney disease: Secondary | ICD-10-CM | POA: Diagnosis not present

## 2018-06-10 DIAGNOSIS — D509 Iron deficiency anemia, unspecified: Secondary | ICD-10-CM | POA: Diagnosis not present

## 2018-06-10 DIAGNOSIS — N186 End stage renal disease: Secondary | ICD-10-CM | POA: Diagnosis not present

## 2018-06-10 DIAGNOSIS — N2581 Secondary hyperparathyroidism of renal origin: Secondary | ICD-10-CM | POA: Diagnosis not present

## 2018-06-10 DIAGNOSIS — Z992 Dependence on renal dialysis: Secondary | ICD-10-CM | POA: Diagnosis not present

## 2018-06-13 ENCOUNTER — Emergency Department (HOSPITAL_COMMUNITY)
Admission: EM | Admit: 2018-06-13 | Discharge: 2018-06-13 | Disposition: A | Discharge status: Home / Self Care | Payer: Medicare Other | Attending: Emergency Medicine | Admitting: Emergency Medicine

## 2018-06-13 ENCOUNTER — Encounter (HOSPITAL_COMMUNITY): Payer: Self-pay | Admitting: Emergency Medicine

## 2018-06-13 ENCOUNTER — Other Ambulatory Visit: Payer: Self-pay

## 2018-06-13 ENCOUNTER — Emergency Department (HOSPITAL_COMMUNITY): Payer: Medicare Other

## 2018-06-13 DIAGNOSIS — Z992 Dependence on renal dialysis: Secondary | ICD-10-CM | POA: Insufficient documentation

## 2018-06-13 DIAGNOSIS — J90 Pleural effusion, not elsewhere classified: Secondary | ICD-10-CM

## 2018-06-13 DIAGNOSIS — Z794 Long term (current) use of insulin: Secondary | ICD-10-CM | POA: Insufficient documentation

## 2018-06-13 DIAGNOSIS — Z79899 Other long term (current) drug therapy: Secondary | ICD-10-CM | POA: Insufficient documentation

## 2018-06-13 DIAGNOSIS — R109 Unspecified abdominal pain: Secondary | ICD-10-CM | POA: Diagnosis not present

## 2018-06-13 DIAGNOSIS — I10 Essential (primary) hypertension: Secondary | ICD-10-CM | POA: Diagnosis not present

## 2018-06-13 DIAGNOSIS — Z9889 Other specified postprocedural states: Secondary | ICD-10-CM | POA: Diagnosis not present

## 2018-06-13 DIAGNOSIS — M545 Low back pain: Secondary | ICD-10-CM | POA: Diagnosis not present

## 2018-06-13 DIAGNOSIS — I5022 Chronic systolic (congestive) heart failure: Secondary | ICD-10-CM | POA: Diagnosis not present

## 2018-06-13 DIAGNOSIS — E1122 Type 2 diabetes mellitus with diabetic chronic kidney disease: Secondary | ICD-10-CM | POA: Insufficient documentation

## 2018-06-13 DIAGNOSIS — R1011 Right upper quadrant pain: Secondary | ICD-10-CM | POA: Diagnosis not present

## 2018-06-13 DIAGNOSIS — I120 Hypertensive chronic kidney disease with stage 5 chronic kidney disease or end stage renal disease: Secondary | ICD-10-CM | POA: Diagnosis not present

## 2018-06-13 DIAGNOSIS — I5042 Chronic combined systolic (congestive) and diastolic (congestive) heart failure: Secondary | ICD-10-CM | POA: Diagnosis not present

## 2018-06-13 DIAGNOSIS — Z7901 Long term (current) use of anticoagulants: Secondary | ICD-10-CM | POA: Insufficient documentation

## 2018-06-13 DIAGNOSIS — N186 End stage renal disease: Secondary | ICD-10-CM | POA: Insufficient documentation

## 2018-06-13 DIAGNOSIS — N3289 Other specified disorders of bladder: Secondary | ICD-10-CM | POA: Diagnosis not present

## 2018-06-13 DIAGNOSIS — R112 Nausea with vomiting, unspecified: Secondary | ICD-10-CM | POA: Insufficient documentation

## 2018-06-13 DIAGNOSIS — K861 Other chronic pancreatitis: Secondary | ICD-10-CM | POA: Diagnosis not present

## 2018-06-13 DIAGNOSIS — Z87891 Personal history of nicotine dependence: Secondary | ICD-10-CM | POA: Diagnosis not present

## 2018-06-13 DIAGNOSIS — R1084 Generalized abdominal pain: Secondary | ICD-10-CM | POA: Diagnosis not present

## 2018-06-13 DIAGNOSIS — N2889 Other specified disorders of kidney and ureter: Secondary | ICD-10-CM | POA: Diagnosis not present

## 2018-06-13 DIAGNOSIS — R110 Nausea: Secondary | ICD-10-CM | POA: Diagnosis not present

## 2018-06-13 DIAGNOSIS — R0902 Hypoxemia: Secondary | ICD-10-CM | POA: Diagnosis not present

## 2018-06-13 DIAGNOSIS — R0789 Other chest pain: Secondary | ICD-10-CM | POA: Diagnosis not present

## 2018-06-13 DIAGNOSIS — T8612 Kidney transplant failure: Secondary | ICD-10-CM | POA: Diagnosis not present

## 2018-06-13 DIAGNOSIS — R079 Chest pain, unspecified: Secondary | ICD-10-CM | POA: Diagnosis not present

## 2018-06-13 DIAGNOSIS — I132 Hypertensive heart and chronic kidney disease with heart failure and with stage 5 chronic kidney disease, or end stage renal disease: Secondary | ICD-10-CM | POA: Insufficient documentation

## 2018-06-13 DIAGNOSIS — I313 Pericardial effusion (noninflammatory): Secondary | ICD-10-CM | POA: Diagnosis not present

## 2018-06-13 DIAGNOSIS — R160 Hepatomegaly, not elsewhere classified: Secondary | ICD-10-CM | POA: Diagnosis not present

## 2018-06-13 DIAGNOSIS — N179 Acute kidney failure, unspecified: Secondary | ICD-10-CM | POA: Diagnosis not present

## 2018-06-13 LAB — CBC WITH DIFFERENTIAL/PLATELET
Abs Immature Granulocytes: 0.01 10*3/uL (ref 0.00–0.07)
Basophils Absolute: 0 10*3/uL (ref 0.0–0.1)
Basophils Relative: 1 %
Eosinophils Absolute: 0.3 10*3/uL (ref 0.0–0.5)
Eosinophils Relative: 7 %
HCT: 30 % — ABNORMAL LOW (ref 39.0–52.0)
Hemoglobin: 9 g/dL — ABNORMAL LOW (ref 13.0–17.0)
Immature Granulocytes: 0 %
Lymphocytes Relative: 11 %
Lymphs Abs: 0.5 10*3/uL — ABNORMAL LOW (ref 0.7–4.0)
MCH: 25.5 pg — ABNORMAL LOW (ref 26.0–34.0)
MCHC: 30 g/dL (ref 30.0–36.0)
MCV: 85 fL (ref 80.0–100.0)
Monocytes Absolute: 0.6 10*3/uL (ref 0.1–1.0)
Monocytes Relative: 15 %
Neutro Abs: 2.7 10*3/uL (ref 1.7–7.7)
Neutrophils Relative %: 66 %
Platelets: 224 10*3/uL (ref 150–400)
RBC: 3.53 MIL/uL — ABNORMAL LOW (ref 4.22–5.81)
RDW: 17.4 % — ABNORMAL HIGH (ref 11.5–15.5)
WBC: 4.1 10*3/uL (ref 4.0–10.5)
nRBC: 0 % (ref 0.0–0.2)

## 2018-06-13 LAB — COMPREHENSIVE METABOLIC PANEL
ALT: 11 U/L (ref 0–44)
AST: 19 U/L (ref 15–41)
Albumin: 2.6 g/dL — ABNORMAL LOW (ref 3.5–5.0)
Alkaline Phosphatase: 121 U/L (ref 38–126)
Anion gap: 13 (ref 5–15)
BUN: 68 mg/dL — ABNORMAL HIGH (ref 6–20)
CO2: 25 mmol/L (ref 22–32)
Calcium: 8.4 mg/dL — ABNORMAL LOW (ref 8.9–10.3)
Chloride: 97 mmol/L — ABNORMAL LOW (ref 98–111)
Creatinine, Ser: 11.33 mg/dL — ABNORMAL HIGH (ref 0.61–1.24)
GFR calc Af Amer: 5 mL/min — ABNORMAL LOW (ref >60)
GFR calc non Af Amer: 4 mL/min — ABNORMAL LOW (ref >60)
Glucose, Bld: 109 mg/dL — ABNORMAL HIGH (ref 70–99)
Potassium: 4.5 mmol/L (ref 3.5–5.1)
Sodium: 135 mmol/L (ref 135–145)
Total Bilirubin: 0.7 mg/dL (ref 0.3–1.2)
Total Protein: 7.8 g/dL (ref 6.5–8.1)

## 2018-06-13 LAB — LIPASE, BLOOD: Lipase: 48 U/L (ref 11–51)

## 2018-06-13 MED ORDER — OXYCODONE HCL 5 MG PO TABS
10.0000 mg | ORAL_TABLET | Freq: Once | ORAL | Status: AC
  Administered 2018-06-13: 10 mg via ORAL
  Filled 2018-06-13: qty 2

## 2018-06-13 MED ORDER — PROMETHAZINE HCL 25 MG/ML IJ SOLN
12.5000 mg | Freq: Once | INTRAMUSCULAR | Status: AC
  Administered 2018-06-13: 12.5 mg via INTRAMUSCULAR
  Filled 2018-06-13: qty 1

## 2018-06-13 MED ORDER — ONDANSETRON 4 MG PO TBDP
4.0000 mg | ORAL_TABLET | Freq: Once | ORAL | Status: AC
  Administered 2018-06-13: 4 mg via ORAL
  Filled 2018-06-13: qty 1

## 2018-06-13 NOTE — Discharge Instructions (Addendum)
Continue to take your home medicines as prescribed.  Avoid alcohol, fried foods, spicy foods, or fatty foods.  Follow-up at your dialysis center to get your treatment.  Follow-up with PCP for reevaluation of symptoms.  Return to the emergency department if any concerning signs or symptoms develop.
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
## 2018-06-13 NOTE — ED Triage Notes (Signed)
Pt in from home with c/o R abd pain and bilateral back pain. States this has been going on x 4 days. Reports emesis, denies fevers. States last dialysis was Wednesday. BP high - 189/117

## 2018-06-13 NOTE — ED Notes (Signed)
Pt rec'd diet coke to drink

## 2018-06-13 NOTE — ED Notes (Signed)
Pt discharged from ED; instructions provided and scripts given; Pt encouraged to return to ED if symptoms worsen and to f/u with PCP; Pt verbalized understanding of all instructions

## 2018-06-14 DIAGNOSIS — R109 Unspecified abdominal pain: Secondary | ICD-10-CM | POA: Diagnosis not present

## 2018-06-14 DIAGNOSIS — R160 Hepatomegaly, not elsewhere classified: Secondary | ICD-10-CM | POA: Diagnosis not present

## 2018-06-14 DIAGNOSIS — T45516A Underdosing of anticoagulants, initial encounter: Secondary | ICD-10-CM | POA: Diagnosis present

## 2018-06-14 DIAGNOSIS — I132 Hypertensive heart and chronic kidney disease with heart failure and with stage 5 chronic kidney disease, or end stage renal disease: Secondary | ICD-10-CM | POA: Diagnosis present

## 2018-06-14 DIAGNOSIS — I517 Cardiomegaly: Secondary | ICD-10-CM | POA: Diagnosis not present

## 2018-06-14 DIAGNOSIS — Z9119 Patient's noncompliance with other medical treatment and regimen: Secondary | ICD-10-CM | POA: Diagnosis not present

## 2018-06-14 DIAGNOSIS — M545 Low back pain: Secondary | ICD-10-CM | POA: Diagnosis not present

## 2018-06-14 DIAGNOSIS — G4739 Other sleep apnea: Secondary | ICD-10-CM | POA: Diagnosis present

## 2018-06-14 DIAGNOSIS — Z992 Dependence on renal dialysis: Secondary | ICD-10-CM | POA: Diagnosis not present

## 2018-06-14 DIAGNOSIS — D72819 Decreased white blood cell count, unspecified: Secondary | ICD-10-CM | POA: Diagnosis present

## 2018-06-14 DIAGNOSIS — I5022 Chronic systolic (congestive) heart failure: Secondary | ICD-10-CM | POA: Diagnosis present

## 2018-06-14 DIAGNOSIS — Z8709 Personal history of other diseases of the respiratory system: Secondary | ICD-10-CM | POA: Diagnosis not present

## 2018-06-14 DIAGNOSIS — G8929 Other chronic pain: Secondary | ICD-10-CM | POA: Diagnosis not present

## 2018-06-14 DIAGNOSIS — N2889 Other specified disorders of kidney and ureter: Secondary | ICD-10-CM | POA: Diagnosis not present

## 2018-06-14 DIAGNOSIS — R262 Difficulty in walking, not elsewhere classified: Secondary | ICD-10-CM | POA: Diagnosis not present

## 2018-06-14 DIAGNOSIS — I313 Pericardial effusion (noninflammatory): Secondary | ICD-10-CM | POA: Diagnosis not present

## 2018-06-14 DIAGNOSIS — J90 Pleural effusion, not elsewhere classified: Secondary | ICD-10-CM | POA: Diagnosis not present

## 2018-06-14 DIAGNOSIS — I5189 Other ill-defined heart diseases: Secondary | ICD-10-CM | POA: Diagnosis not present

## 2018-06-14 DIAGNOSIS — N186 End stage renal disease: Secondary | ICD-10-CM | POA: Diagnosis not present

## 2018-06-14 DIAGNOSIS — R112 Nausea with vomiting, unspecified: Secondary | ICD-10-CM | POA: Diagnosis present

## 2018-06-14 DIAGNOSIS — Z888 Allergy status to other drugs, medicaments and biological substances status: Secondary | ICD-10-CM | POA: Diagnosis not present

## 2018-06-14 DIAGNOSIS — T8612 Kidney transplant failure: Secondary | ICD-10-CM | POA: Diagnosis present

## 2018-06-14 DIAGNOSIS — D631 Anemia in chronic kidney disease: Secondary | ICD-10-CM | POA: Diagnosis not present

## 2018-06-14 DIAGNOSIS — M5440 Lumbago with sciatica, unspecified side: Secondary | ICD-10-CM | POA: Diagnosis not present

## 2018-06-14 DIAGNOSIS — Z9981 Dependence on supplemental oxygen: Secondary | ICD-10-CM | POA: Diagnosis not present

## 2018-06-14 DIAGNOSIS — N3289 Other specified disorders of bladder: Secondary | ICD-10-CM | POA: Diagnosis not present

## 2018-06-14 DIAGNOSIS — K861 Other chronic pancreatitis: Secondary | ICD-10-CM | POA: Diagnosis not present

## 2018-06-14 DIAGNOSIS — I5042 Chronic combined systolic (congestive) and diastolic (congestive) heart failure: Secondary | ICD-10-CM | POA: Diagnosis not present

## 2018-06-14 DIAGNOSIS — Z79899 Other long term (current) drug therapy: Secondary | ICD-10-CM | POA: Diagnosis not present

## 2018-06-14 DIAGNOSIS — Z7901 Long term (current) use of anticoagulants: Secondary | ICD-10-CM | POA: Diagnosis not present

## 2018-06-14 DIAGNOSIS — I120 Hypertensive chronic kidney disease with stage 5 chronic kidney disease or end stage renal disease: Secondary | ICD-10-CM | POA: Diagnosis not present

## 2018-06-14 DIAGNOSIS — E1122 Type 2 diabetes mellitus with diabetic chronic kidney disease: Secondary | ICD-10-CM | POA: Diagnosis present

## 2018-06-14 DIAGNOSIS — R0602 Shortness of breath: Secondary | ICD-10-CM | POA: Diagnosis not present

## 2018-06-14 DIAGNOSIS — Z9115 Patient's noncompliance with renal dialysis: Secondary | ICD-10-CM | POA: Diagnosis not present

## 2018-06-14 DIAGNOSIS — Z87891 Personal history of nicotine dependence: Secondary | ICD-10-CM | POA: Diagnosis not present

## 2018-06-14 DIAGNOSIS — K219 Gastro-esophageal reflux disease without esophagitis: Secondary | ICD-10-CM | POA: Diagnosis present

## 2018-06-14 DIAGNOSIS — N179 Acute kidney failure, unspecified: Secondary | ICD-10-CM | POA: Diagnosis present

## 2018-06-14 DIAGNOSIS — I088 Other rheumatic multiple valve diseases: Secondary | ICD-10-CM | POA: Diagnosis not present

## 2018-06-15 ENCOUNTER — Emergency Department (HOSPITAL_COMMUNITY)
Admission: EM | Admit: 2018-06-15 | Discharge: 2018-06-15 | Disposition: A | Discharge status: Home / Self Care | Payer: Medicare Other | Attending: Emergency Medicine | Admitting: Emergency Medicine

## 2018-06-15 ENCOUNTER — Other Ambulatory Visit: Payer: Self-pay

## 2018-06-15 ENCOUNTER — Emergency Department (HOSPITAL_COMMUNITY): Payer: Medicare Other

## 2018-06-15 DIAGNOSIS — I132 Hypertensive heart and chronic kidney disease with heart failure and with stage 5 chronic kidney disease, or end stage renal disease: Secondary | ICD-10-CM | POA: Insufficient documentation

## 2018-06-15 DIAGNOSIS — N186 End stage renal disease: Secondary | ICD-10-CM | POA: Insufficient documentation

## 2018-06-15 DIAGNOSIS — R109 Unspecified abdominal pain: Secondary | ICD-10-CM | POA: Insufficient documentation

## 2018-06-15 DIAGNOSIS — Z87891 Personal history of nicotine dependence: Secondary | ICD-10-CM | POA: Insufficient documentation

## 2018-06-15 DIAGNOSIS — G8929 Other chronic pain: Secondary | ICD-10-CM | POA: Diagnosis not present

## 2018-06-15 DIAGNOSIS — Z7901 Long term (current) use of anticoagulants: Secondary | ICD-10-CM | POA: Insufficient documentation

## 2018-06-15 DIAGNOSIS — I5042 Chronic combined systolic (congestive) and diastolic (congestive) heart failure: Secondary | ICD-10-CM | POA: Insufficient documentation

## 2018-06-15 DIAGNOSIS — Z79899 Other long term (current) drug therapy: Secondary | ICD-10-CM | POA: Insufficient documentation

## 2018-06-15 DIAGNOSIS — M545 Low back pain: Secondary | ICD-10-CM | POA: Diagnosis not present

## 2018-06-15 DIAGNOSIS — R0602 Shortness of breath: Secondary | ICD-10-CM | POA: Diagnosis not present

## 2018-06-15 DIAGNOSIS — Z992 Dependence on renal dialysis: Secondary | ICD-10-CM | POA: Insufficient documentation

## 2018-06-15 DIAGNOSIS — J90 Pleural effusion, not elsewhere classified: Secondary | ICD-10-CM | POA: Insufficient documentation

## 2018-06-15 DIAGNOSIS — I120 Hypertensive chronic kidney disease with stage 5 chronic kidney disease or end stage renal disease: Secondary | ICD-10-CM | POA: Diagnosis not present

## 2018-06-15 DIAGNOSIS — R262 Difficulty in walking, not elsewhere classified: Secondary | ICD-10-CM | POA: Diagnosis not present

## 2018-06-15 DIAGNOSIS — M5440 Lumbago with sciatica, unspecified side: Secondary | ICD-10-CM | POA: Diagnosis not present

## 2018-06-15 LAB — BASIC METABOLIC PANEL
Anion gap: 13 (ref 5–15)
BUN: 50 mg/dL — AB (ref 6–20)
CALCIUM: 8.8 mg/dL — AB (ref 8.9–10.3)
CO2: 26 mmol/L (ref 22–32)
Chloride: 98 mmol/L (ref 98–111)
Creatinine, Ser: 9.47 mg/dL — ABNORMAL HIGH (ref 0.61–1.24)
GFR calc Af Amer: 6 mL/min — ABNORMAL LOW (ref >60)
GFR, EST NON AFRICAN AMERICAN: 6 mL/min — AB (ref >60)
GLUCOSE: 102 mg/dL — AB (ref 70–99)
POTASSIUM: 5.2 mmol/L — AB (ref 3.5–5.1)
Sodium: 137 mmol/L (ref 135–145)

## 2018-06-15 LAB — I-STAT CHEM 8, ED
BUN: 46 mg/dL — AB (ref 6–20)
CREATININE: 10.2 mg/dL — AB (ref 0.61–1.24)
Calcium, Ion: 1.04 mmol/L — ABNORMAL LOW (ref 1.15–1.40)
Chloride: 98 mmol/L (ref 98–111)
Glucose, Bld: 96 mg/dL (ref 70–99)
HEMATOCRIT: 28 % — AB (ref 39.0–52.0)
Hemoglobin: 9.5 g/dL — ABNORMAL LOW (ref 13.0–17.0)
Potassium: 5 mmol/L (ref 3.5–5.1)
Sodium: 136 mmol/L (ref 135–145)
TCO2: 31 mmol/L (ref 22–32)

## 2018-06-15 LAB — CBC
HCT: 28.3 % — ABNORMAL LOW (ref 39.0–52.0)
HEMOGLOBIN: 8.2 g/dL — AB (ref 13.0–17.0)
MCH: 25.5 pg — AB (ref 26.0–34.0)
MCHC: 29 g/dL — AB (ref 30.0–36.0)
MCV: 87.9 fL (ref 80.0–100.0)
Platelets: 160 10*3/uL (ref 150–400)
RBC: 3.22 MIL/uL — ABNORMAL LOW (ref 4.22–5.81)
RDW: 17.9 % — ABNORMAL HIGH (ref 11.5–15.5)
WBC: 4.4 10*3/uL (ref 4.0–10.5)
nRBC: 0 % (ref 0.0–0.2)

## 2018-06-15 LAB — I-STAT TROPONIN, ED: TROPONIN I, POC: 0.02 ng/mL (ref 0.00–0.08)

## 2018-06-15 MED ORDER — PANTOPRAZOLE SODIUM 40 MG PO TBEC
40.00 | DELAYED_RELEASE_TABLET | ORAL | Status: DC
Start: 2018-06-15 — End: 2018-06-15

## 2018-06-15 MED ORDER — GENERIC EXTERNAL MEDICATION
4.00 | Status: DC
Start: ? — End: 2018-06-15

## 2018-06-15 MED ORDER — PANCRELIPASE (LIP-PROT-AMYL) 12000-38000 UNITS PO CPEP
12000.00 | ORAL_CAPSULE | ORAL | Status: DC
Start: 2018-06-15 — End: 2018-06-15

## 2018-06-15 MED ORDER — OXYCODONE HCL 10 MG PO TABS
10.00 | ORAL_TABLET | ORAL | Status: DC
Start: ? — End: 2018-06-15

## 2018-06-15 MED ORDER — NITROGLYCERIN 0.4 MG SL SUBL
0.40 | SUBLINGUAL_TABLET | SUBLINGUAL | Status: DC
Start: ? — End: 2018-06-15

## 2018-06-15 MED ORDER — GENERIC EXTERNAL MEDICATION
650.00 | Status: DC
Start: ? — End: 2018-06-15

## 2018-06-15 MED ORDER — BUSPIRONE HCL 5 MG PO TABS
5.00 | ORAL_TABLET | ORAL | Status: DC
Start: 2018-06-15 — End: 2018-06-15

## 2018-06-15 MED ORDER — HYDRALAZINE HCL 25 MG PO TABS
100.00 | ORAL_TABLET | ORAL | Status: DC
Start: 2018-06-15 — End: 2018-06-15

## 2018-06-15 MED ORDER — AMLODIPINE BESYLATE 5 MG PO TABS
5.00 | ORAL_TABLET | ORAL | Status: DC
Start: 2018-06-15 — End: 2018-06-15

## 2018-06-15 MED ORDER — CARVEDILOL 25 MG PO TABS
25.00 | ORAL_TABLET | ORAL | Status: DC
Start: 2018-06-15 — End: 2018-06-15

## 2018-06-15 MED ORDER — GENERIC EXTERNAL MEDICATION
90.00 | Status: DC
Start: 2018-06-15 — End: 2018-06-15

## 2018-06-15 MED ORDER — HYDRALAZINE HCL 20 MG/ML IJ SOLN
10.00 | INTRAMUSCULAR | Status: DC
Start: ? — End: 2018-06-15

## 2018-06-15 MED ORDER — NEPHRO-VITE 0.8 MG PO TABS
1.00 | ORAL_TABLET | ORAL | Status: DC
Start: 2018-06-16 — End: 2018-06-15

## 2018-06-15 MED ORDER — ACETAMINOPHEN 325 MG PO TABS
650.00 | ORAL_TABLET | ORAL | Status: DC
Start: ? — End: 2018-06-15

## 2018-06-15 MED ORDER — PROCHLORPERAZINE MALEATE 5 MG PO TABS
5.00 | ORAL_TABLET | ORAL | Status: DC
Start: ? — End: 2018-06-15

## 2018-06-15 NOTE — ED Notes (Signed)
Pt back from X-ray.

## 2018-06-15 NOTE — ED Notes (Signed)
Patient stated "I am ready to get the fuck out of here. Fuck the paperwork and fuck your vital signs." Patient helped into wheel chair and rolled out to lobby. Pt expressing frustration and emotional support given. Pt did not receive discharge paperwork.
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
## 2018-06-15 NOTE — ED Notes (Signed)
Pt given call bell and blanket.

## 2018-06-15 NOTE — Discharge Instructions (Addendum)
Follow-up for dialysis tomorrow as scheduled.  See your medical doctor as needed for problems.

## 2018-06-15 NOTE — ED Triage Notes (Signed)
Patient c/o SOB, "fluid around my lungs", and abdominal pain. This is a chronic complaint.

## 2018-06-15 NOTE — ED Notes (Signed)
Called [facility] to add on I-Stat Chem 8

## 2018-06-16 ENCOUNTER — Encounter: Payer: Self-pay | Admitting: Internal Medicine

## 2018-06-16 DIAGNOSIS — N186 End stage renal disease: Secondary | ICD-10-CM | POA: Diagnosis not present

## 2018-06-16 DIAGNOSIS — Z992 Dependence on renal dialysis: Secondary | ICD-10-CM | POA: Diagnosis not present

## 2018-06-16 DIAGNOSIS — D631 Anemia in chronic kidney disease: Secondary | ICD-10-CM | POA: Diagnosis not present

## 2018-06-16 DIAGNOSIS — N2581 Secondary hyperparathyroidism of renal origin: Secondary | ICD-10-CM | POA: Diagnosis not present

## 2018-06-16 DIAGNOSIS — Z86718 Personal history of other venous thrombosis and embolism: Secondary | ICD-10-CM | POA: Diagnosis not present

## 2018-06-16 DIAGNOSIS — D509 Iron deficiency anemia, unspecified: Secondary | ICD-10-CM | POA: Diagnosis not present

## 2018-06-16 LAB — PROTIME-INR

## 2018-06-19 DIAGNOSIS — N2581 Secondary hyperparathyroidism of renal origin: Secondary | ICD-10-CM | POA: Diagnosis not present

## 2018-06-19 DIAGNOSIS — Z992 Dependence on renal dialysis: Secondary | ICD-10-CM | POA: Diagnosis not present

## 2018-06-19 DIAGNOSIS — D509 Iron deficiency anemia, unspecified: Secondary | ICD-10-CM | POA: Diagnosis not present

## 2018-06-19 DIAGNOSIS — N186 End stage renal disease: Secondary | ICD-10-CM | POA: Diagnosis not present

## 2018-06-19 DIAGNOSIS — D631 Anemia in chronic kidney disease: Secondary | ICD-10-CM | POA: Diagnosis not present

## 2018-06-20 IMAGING — DX DG CHEST 2V
2 series · 2 of 2 positions shown · non-contrast
Comparison: 01/07/2016

CLINICAL DATA: Dialysis patient, chest pain and shortness of breath

EXAM:
CHEST  2 VIEW

[w chest pa]
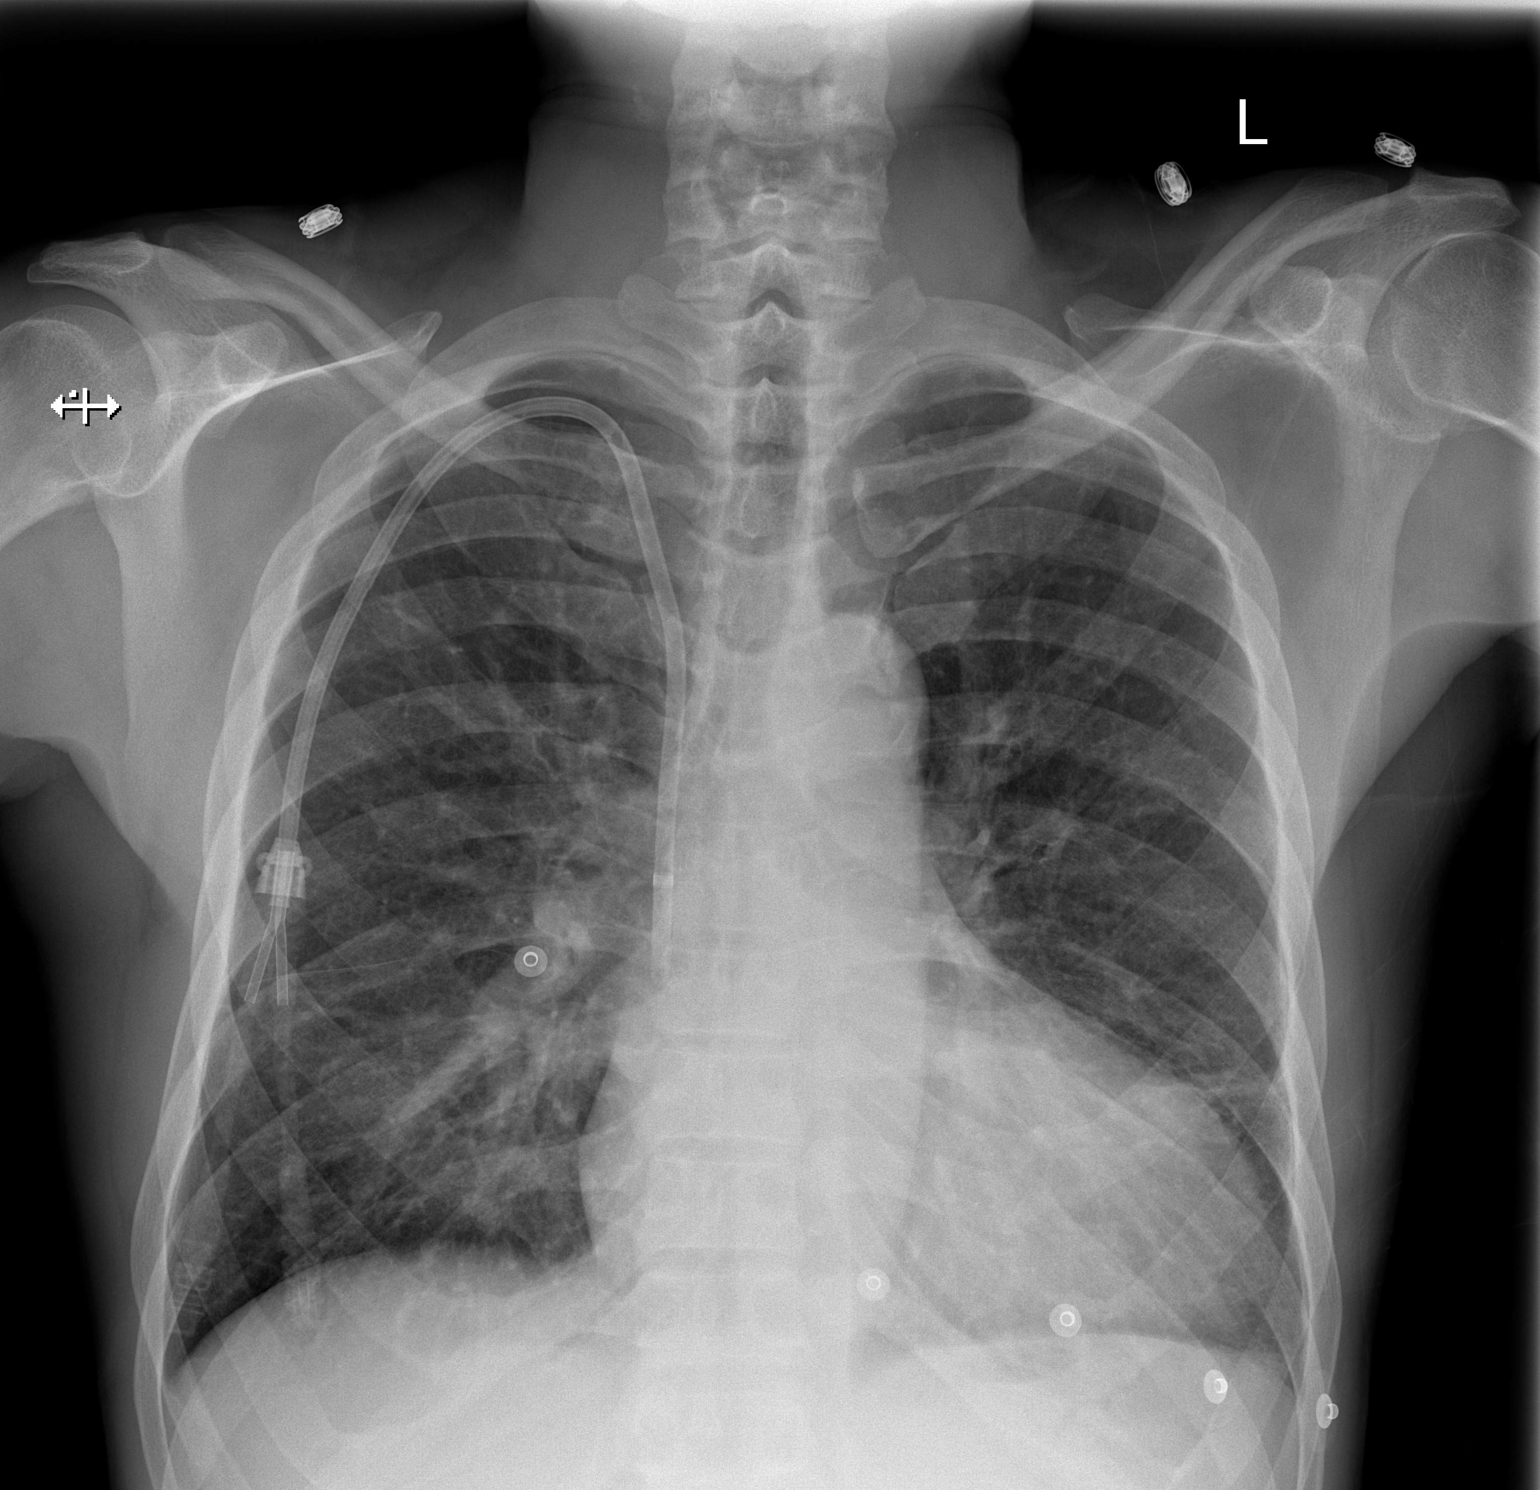

[w chest lat]
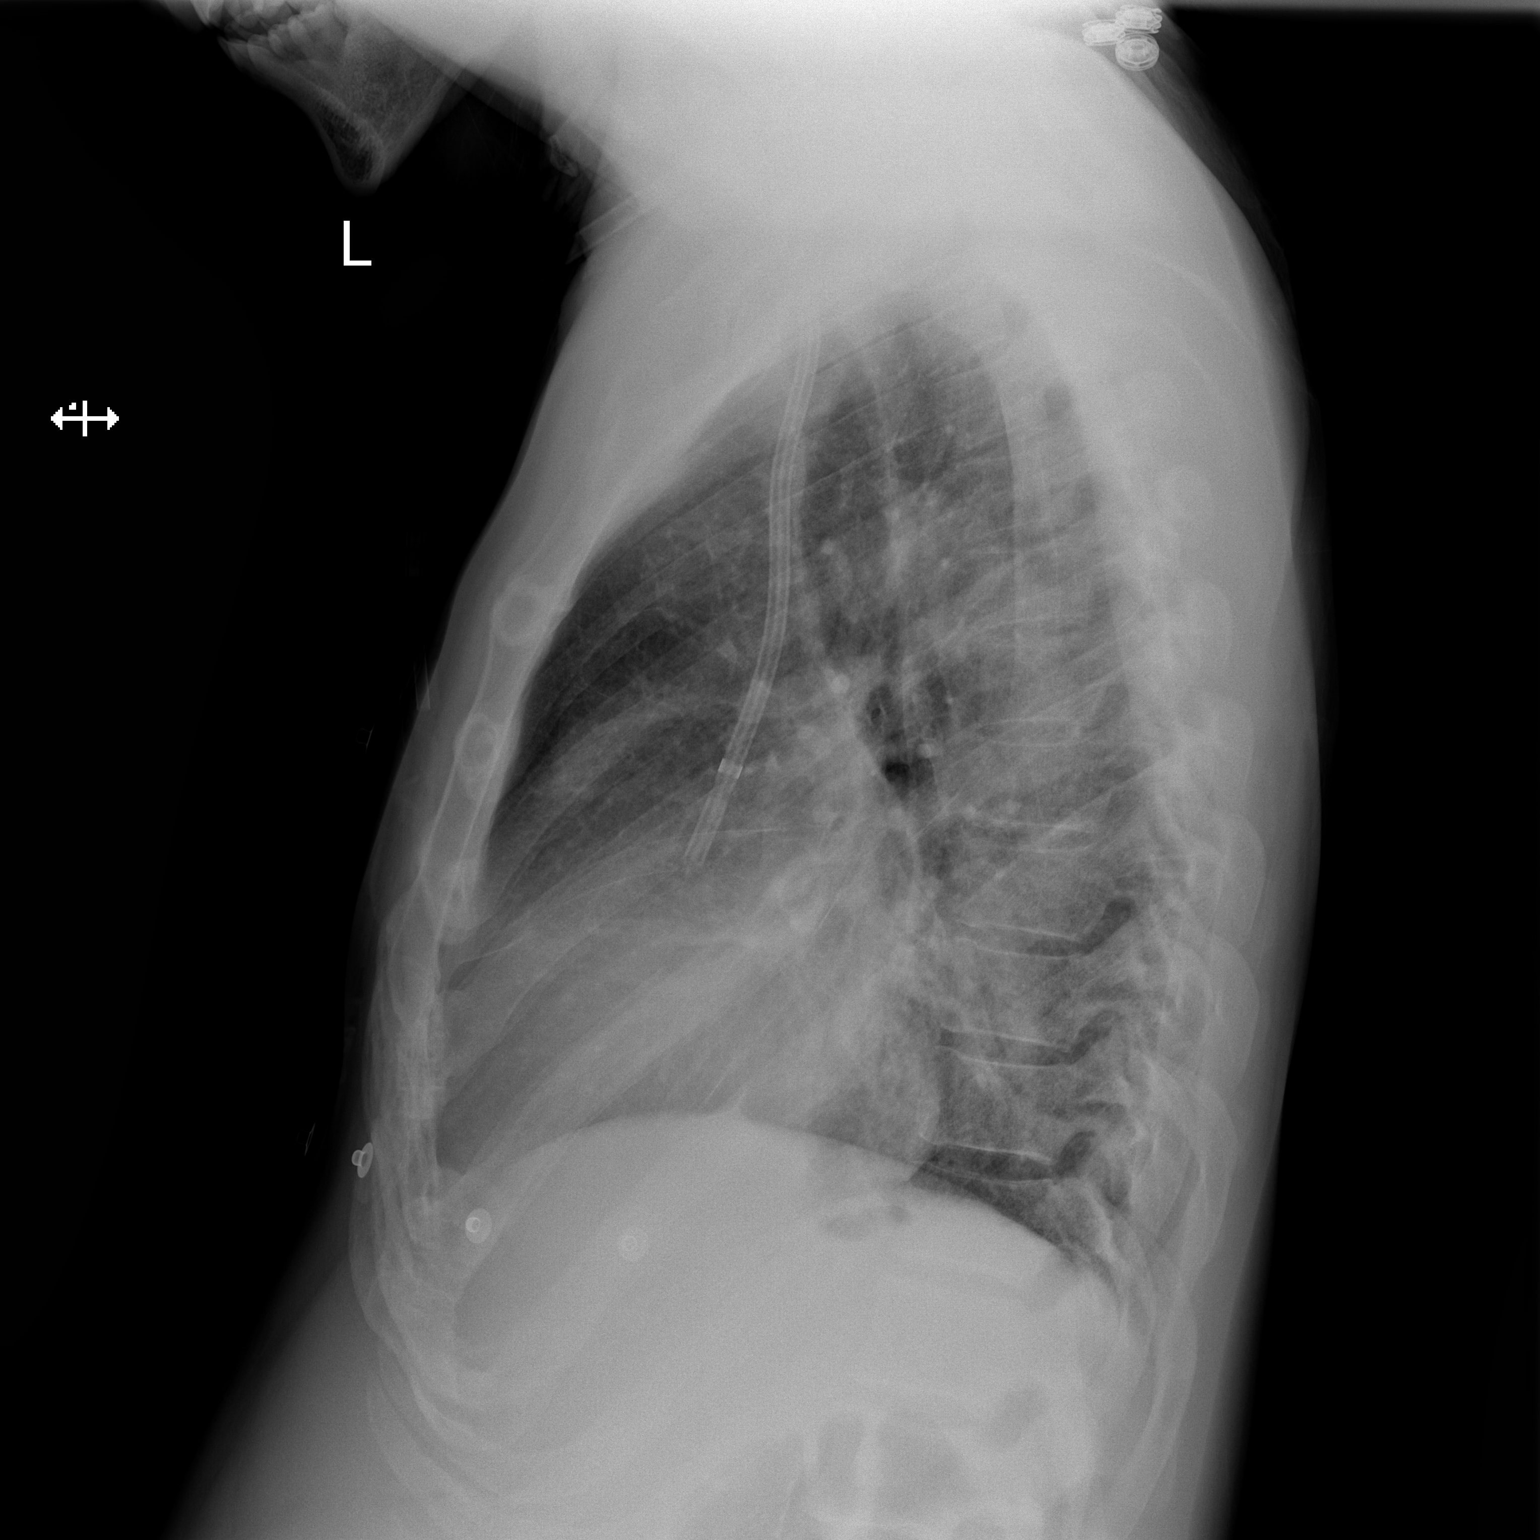

[2 of 2 positions shown; findings below may reference images not displayed]

FINDINGS: There is bilateral mild interstitial thickening. There is no focal
parenchymal opacity. There is no pleural effusion or pneumothorax.
There is stable cardiomegaly. There is a dual-lumen right-sided
central venous catheter with the tip projecting over the SVC.

The osseous structures are unremarkable.
IMPRESSION: Cardiomegaly with mild interstitial pulmonary edema.

## 2018-06-21 DIAGNOSIS — T8610 Unspecified complication of kidney transplant: Secondary | ICD-10-CM | POA: Diagnosis not present

## 2018-06-21 DIAGNOSIS — N186 End stage renal disease: Secondary | ICD-10-CM | POA: Diagnosis not present

## 2018-06-21 DIAGNOSIS — Z992 Dependence on renal dialysis: Secondary | ICD-10-CM | POA: Diagnosis not present

## 2018-06-22 ENCOUNTER — Encounter (HOSPITAL_COMMUNITY): Payer: Self-pay | Admitting: *Deleted

## 2018-06-22 ENCOUNTER — Other Ambulatory Visit: Payer: Self-pay

## 2018-06-22 ENCOUNTER — Emergency Department (HOSPITAL_COMMUNITY): Payer: Medicare Other

## 2018-06-22 ENCOUNTER — Inpatient Hospital Stay (HOSPITAL_COMMUNITY)
Admission: EM | Admit: 2018-06-22 | Discharge: 2018-06-24 | DRG: 438 | Disposition: A | Discharge status: Home / Self Care | Payer: Medicare Other | Attending: Internal Medicine | Admitting: Internal Medicine

## 2018-06-22 DIAGNOSIS — Z9115 Patient's noncompliance with renal dialysis: Secondary | ICD-10-CM

## 2018-06-22 DIAGNOSIS — I5023 Acute on chronic systolic (congestive) heart failure: Secondary | ICD-10-CM | POA: Diagnosis present

## 2018-06-22 DIAGNOSIS — E1165 Type 2 diabetes mellitus with hyperglycemia: Secondary | ICD-10-CM | POA: Diagnosis not present

## 2018-06-22 DIAGNOSIS — R109 Unspecified abdominal pain: Secondary | ICD-10-CM | POA: Diagnosis present

## 2018-06-22 DIAGNOSIS — N186 End stage renal disease: Secondary | ICD-10-CM | POA: Diagnosis not present

## 2018-06-22 DIAGNOSIS — K859 Acute pancreatitis without necrosis or infection, unspecified: Principal | ICD-10-CM | POA: Diagnosis present

## 2018-06-22 DIAGNOSIS — F1120 Opioid dependence, uncomplicated: Secondary | ICD-10-CM | POA: Diagnosis present

## 2018-06-22 DIAGNOSIS — E875 Hyperkalemia: Secondary | ICD-10-CM | POA: Diagnosis present

## 2018-06-22 DIAGNOSIS — Z7901 Long term (current) use of anticoagulants: Secondary | ICD-10-CM | POA: Diagnosis not present

## 2018-06-22 DIAGNOSIS — I428 Other cardiomyopathies: Secondary | ICD-10-CM | POA: Diagnosis present

## 2018-06-22 DIAGNOSIS — N2581 Secondary hyperparathyroidism of renal origin: Secondary | ICD-10-CM | POA: Diagnosis not present

## 2018-06-22 DIAGNOSIS — R001 Bradycardia, unspecified: Secondary | ICD-10-CM | POA: Diagnosis not present

## 2018-06-22 DIAGNOSIS — N281 Cyst of kidney, acquired: Secondary | ICD-10-CM | POA: Diagnosis present

## 2018-06-22 DIAGNOSIS — R0602 Shortness of breath: Secondary | ICD-10-CM | POA: Diagnosis not present

## 2018-06-22 DIAGNOSIS — E1122 Type 2 diabetes mellitus with diabetic chronic kidney disease: Secondary | ICD-10-CM | POA: Diagnosis present

## 2018-06-22 DIAGNOSIS — Z8249 Family history of ischemic heart disease and other diseases of the circulatory system: Secondary | ICD-10-CM

## 2018-06-22 DIAGNOSIS — D899 Disorder involving the immune mechanism, unspecified: Secondary | ICD-10-CM | POA: Diagnosis present

## 2018-06-22 DIAGNOSIS — IMO0002 Reserved for concepts with insufficient information to code with codable children: Secondary | ICD-10-CM | POA: Diagnosis present

## 2018-06-22 DIAGNOSIS — I5022 Chronic systolic (congestive) heart failure: Secondary | ICD-10-CM | POA: Diagnosis present

## 2018-06-22 DIAGNOSIS — J948 Other specified pleural conditions: Secondary | ICD-10-CM | POA: Diagnosis present

## 2018-06-22 DIAGNOSIS — Z9119 Patient's noncompliance with other medical treatment and regimen: Secondary | ICD-10-CM | POA: Diagnosis not present

## 2018-06-22 DIAGNOSIS — K861 Other chronic pancreatitis: Secondary | ICD-10-CM | POA: Diagnosis not present

## 2018-06-22 DIAGNOSIS — Z87891 Personal history of nicotine dependence: Secondary | ICD-10-CM | POA: Diagnosis not present

## 2018-06-22 DIAGNOSIS — R1084 Generalized abdominal pain: Secondary | ICD-10-CM

## 2018-06-22 DIAGNOSIS — E1129 Type 2 diabetes mellitus with other diabetic kidney complication: Secondary | ICD-10-CM | POA: Diagnosis present

## 2018-06-22 DIAGNOSIS — Z86718 Personal history of other venous thrombosis and embolism: Secondary | ICD-10-CM

## 2018-06-22 DIAGNOSIS — N189 Chronic kidney disease, unspecified: Secondary | ICD-10-CM

## 2018-06-22 DIAGNOSIS — G894 Chronic pain syndrome: Secondary | ICD-10-CM | POA: Diagnosis present

## 2018-06-22 DIAGNOSIS — I700 Atherosclerosis of aorta: Secondary | ICD-10-CM | POA: Diagnosis present

## 2018-06-22 DIAGNOSIS — I5042 Chronic combined systolic (congestive) and diastolic (congestive) heart failure: Secondary | ICD-10-CM | POA: Diagnosis present

## 2018-06-22 DIAGNOSIS — I10 Essential (primary) hypertension: Secondary | ICD-10-CM | POA: Insufficient documentation

## 2018-06-22 DIAGNOSIS — T8612 Kidney transplant failure: Secondary | ICD-10-CM | POA: Diagnosis present

## 2018-06-22 DIAGNOSIS — K529 Noninfective gastroenteritis and colitis, unspecified: Secondary | ICD-10-CM | POA: Diagnosis not present

## 2018-06-22 DIAGNOSIS — I2510 Atherosclerotic heart disease of native coronary artery without angina pectoris: Secondary | ICD-10-CM | POA: Diagnosis present

## 2018-06-22 DIAGNOSIS — D631 Anemia in chronic kidney disease: Secondary | ICD-10-CM | POA: Diagnosis not present

## 2018-06-22 DIAGNOSIS — J90 Pleural effusion, not elsewhere classified: Secondary | ICD-10-CM | POA: Diagnosis not present

## 2018-06-22 DIAGNOSIS — R112 Nausea with vomiting, unspecified: Secondary | ICD-10-CM | POA: Diagnosis not present

## 2018-06-22 DIAGNOSIS — Z79899 Other long term (current) drug therapy: Secondary | ICD-10-CM

## 2018-06-22 DIAGNOSIS — I313 Pericardial effusion (noninflammatory): Secondary | ICD-10-CM | POA: Diagnosis not present

## 2018-06-22 DIAGNOSIS — I132 Hypertensive heart and chronic kidney disease with heart failure and with stage 5 chronic kidney disease, or end stage renal disease: Secondary | ICD-10-CM | POA: Diagnosis present

## 2018-06-22 DIAGNOSIS — E785 Hyperlipidemia, unspecified: Secondary | ICD-10-CM | POA: Diagnosis present

## 2018-06-22 DIAGNOSIS — Z992 Dependence on renal dialysis: Secondary | ICD-10-CM

## 2018-06-22 HISTORY — DX: Essential (primary) hypertension: I10

## 2018-06-22 LAB — CBC
HEMATOCRIT: 32.2 % — AB (ref 39.0–52.0)
HEMOGLOBIN: 9.4 g/dL — AB (ref 13.0–17.0)
MCH: 25.1 pg — AB (ref 26.0–34.0)
MCHC: 29.2 g/dL — AB (ref 30.0–36.0)
MCV: 85.9 fL (ref 80.0–100.0)
Platelets: 237 10*3/uL (ref 150–400)
RBC: 3.75 MIL/uL — ABNORMAL LOW (ref 4.22–5.81)
RDW: 18.3 % — AB (ref 11.5–15.5)
WBC: 4.6 10*3/uL (ref 4.0–10.5)
nRBC: 0 % (ref 0.0–0.2)

## 2018-06-22 LAB — COMPREHENSIVE METABOLIC PANEL
ALK PHOS: 122 U/L (ref 38–126)
ALT: 10 U/L (ref 0–44)
AST: 18 U/L (ref 15–41)
Albumin: 2.8 g/dL — ABNORMAL LOW (ref 3.5–5.0)
Anion gap: 15 (ref 5–15)
BILIRUBIN TOTAL: 0.9 mg/dL (ref 0.3–1.2)
BUN: 64 mg/dL — AB (ref 6–20)
CALCIUM: 9.2 mg/dL (ref 8.9–10.3)
CHLORIDE: 96 mmol/L — AB (ref 98–111)
CO2: 27 mmol/L (ref 22–32)
CREATININE: 10.81 mg/dL — AB (ref 0.61–1.24)
GFR, EST AFRICAN AMERICAN: 6 mL/min — AB (ref >60)
GFR, EST NON AFRICAN AMERICAN: 5 mL/min — AB (ref >60)
Glucose, Bld: 84 mg/dL (ref 70–99)
Potassium: 4.6 mmol/L (ref 3.5–5.1)
Sodium: 138 mmol/L (ref 135–145)
Total Protein: 8.6 g/dL — ABNORMAL HIGH (ref 6.5–8.1)

## 2018-06-22 LAB — LIPASE, BLOOD: LIPASE: 56 U/L — AB (ref 11–51)

## 2018-06-22 MED ORDER — METOCLOPRAMIDE HCL 5 MG/ML IJ SOLN
10.0000 mg | Freq: Once | INTRAMUSCULAR | Status: AC
  Administered 2018-06-22: 10 mg via INTRAVENOUS
  Filled 2018-06-22: qty 2

## 2018-06-22 MED ORDER — DOCUSATE SODIUM 283 MG RE ENEM
1.0000 | ENEMA | RECTAL | Status: DC | PRN
  Filled 2018-06-22: qty 1

## 2018-06-22 MED ORDER — ONDANSETRON HCL 4 MG PO TABS
4.0000 mg | ORAL_TABLET | Freq: Four times a day (QID) | ORAL | Status: DC | PRN
  Filled 2018-06-22: qty 1

## 2018-06-22 MED ORDER — DICYCLOMINE HCL 10 MG PO CAPS
10.0000 mg | ORAL_CAPSULE | Freq: Four times a day (QID) | ORAL | Status: DC | PRN
  Filled 2018-06-22: qty 1

## 2018-06-22 MED ORDER — APIXABAN 5 MG PO TABS
5.0000 mg | ORAL_TABLET | Freq: Two times a day (BID) | ORAL | Status: DC
  Administered 2018-06-22 – 2018-06-24 (×3): 5 mg via ORAL
  Filled 2018-06-22 (×3): qty 1

## 2018-06-22 MED ORDER — GABAPENTIN 300 MG PO CAPS
300.0000 mg | ORAL_CAPSULE | Freq: Every evening | ORAL | Status: DC
  Administered 2018-06-22 – 2018-06-23 (×2): 300 mg via ORAL
  Filled 2018-06-22 (×2): qty 1

## 2018-06-22 MED ORDER — LANTHANUM CARBONATE 500 MG PO CHEW
1000.0000 mg | CHEWABLE_TABLET | Freq: Three times a day (TID) | ORAL | Status: DC
  Administered 2018-06-22 – 2018-06-24 (×4): 1000 mg via ORAL
  Filled 2018-06-22 (×4): qty 2

## 2018-06-22 MED ORDER — PANTOPRAZOLE SODIUM 40 MG PO TBEC
40.0000 mg | DELAYED_RELEASE_TABLET | Freq: Every day | ORAL | Status: DC
  Administered 2018-06-22 – 2018-06-24 (×2): 40 mg via ORAL
  Filled 2018-06-22 (×2): qty 1

## 2018-06-22 MED ORDER — SENNOSIDES-DOCUSATE SODIUM 8.6-50 MG PO TABS: 2.0000 | ORAL_TABLET | Freq: Every evening | ORAL | Status: DC | PRN

## 2018-06-22 MED ORDER — SORBITOL 70 % SOLN
30.0000 mL | Status: DC | PRN
  Filled 2018-06-22: qty 30

## 2018-06-22 MED ORDER — MORPHINE SULFATE (PF) 4 MG/ML IV SOLN
4.0000 mg | Freq: Once | INTRAVENOUS | Status: DC
  Filled 2018-06-22: qty 1

## 2018-06-22 MED ORDER — AMLODIPINE BESYLATE 5 MG PO TABS
5.0000 mg | ORAL_TABLET | Freq: Every day | ORAL | Status: DC
  Administered 2018-06-22 – 2018-06-24 (×2): 5 mg via ORAL
  Filled 2018-06-22 (×2): qty 1

## 2018-06-22 MED ORDER — CALCIUM CARBONATE ANTACID 1250 MG/5ML PO SUSP
500.0000 mg | Freq: Four times a day (QID) | ORAL | Status: DC | PRN
  Filled 2018-06-22: qty 5

## 2018-06-22 MED ORDER — MORPHINE SULFATE (PF) 4 MG/ML IV SOLN
4.0000 mg | Freq: Once | INTRAVENOUS | Status: AC
  Administered 2018-06-22: 4 mg via INTRAVENOUS
  Filled 2018-06-22: qty 1

## 2018-06-22 MED ORDER — NEPRO/CARBSTEADY PO LIQD
237.0000 mL | Freq: Three times a day (TID) | ORAL | Status: DC | PRN
  Filled 2018-06-22: qty 237

## 2018-06-22 MED ORDER — RENA-VITE PO TABS
1.0000 | ORAL_TABLET | Freq: Every day | ORAL | Status: DC
  Administered 2018-06-22 – 2018-06-23 (×2): 1 via ORAL
  Filled 2018-06-22 (×2): qty 1

## 2018-06-22 MED ORDER — ONDANSETRON HCL 4 MG/2ML IJ SOLN
4.0000 mg | Freq: Four times a day (QID) | INTRAMUSCULAR | Status: DC | PRN
  Administered 2018-06-23: 4 mg via INTRAVENOUS
  Filled 2018-06-22: qty 2

## 2018-06-22 MED ORDER — HYDRALAZINE HCL 50 MG PO TABS
100.0000 mg | ORAL_TABLET | Freq: Three times a day (TID) | ORAL | Status: DC
  Administered 2018-06-22 – 2018-06-23 (×4): 100 mg via ORAL
  Filled 2018-06-22 (×5): qty 2

## 2018-06-22 MED ORDER — OXYCODONE HCL 5 MG PO TABS
10.0000 mg | ORAL_TABLET | ORAL | Status: DC | PRN
  Administered 2018-06-22: 10 mg via ORAL
  Filled 2018-06-22: qty 2

## 2018-06-22 MED ORDER — MORPHINE SULFATE (PF) 4 MG/ML IV SOLN
4.0000 mg | Freq: Once | INTRAVENOUS | Status: AC
  Administered 2018-06-22: 4 mg via INTRAMUSCULAR

## 2018-06-22 MED ORDER — CINACALCET HCL 30 MG PO TABS
90.0000 mg | ORAL_TABLET | Freq: Every evening | ORAL | Status: DC
  Administered 2018-06-22 – 2018-06-23 (×2): 90 mg via ORAL
  Filled 2018-06-22 (×2): qty 3

## 2018-06-22 MED ORDER — CARVEDILOL 25 MG PO TABS
25.0000 mg | ORAL_TABLET | Freq: Two times a day (BID) | ORAL | Status: DC
  Administered 2018-06-22 – 2018-06-23 (×2): 25 mg via ORAL
  Filled 2018-06-22 (×2): qty 1

## 2018-06-22 MED ORDER — LIDOCAINE HCL (PF) 1 % IJ SOLN
30.0000 mL | Freq: Once | INTRAMUSCULAR | Status: AC
  Administered 2018-06-22: 5 mL via INTRADERMAL
  Filled 2018-06-22: qty 30

## 2018-06-22 MED ORDER — HYDROXYZINE HCL 10 MG PO TABS
10.0000 mg | ORAL_TABLET | Freq: Three times a day (TID) | ORAL | Status: DC | PRN
  Administered 2018-06-22: 10 mg via ORAL
  Filled 2018-06-22 (×2): qty 1

## 2018-06-22 MED ORDER — BUSPIRONE HCL 5 MG PO TABS
5.0000 mg | ORAL_TABLET | Freq: Two times a day (BID) | ORAL | Status: DC
  Administered 2018-06-22 – 2018-06-24 (×3): 5 mg via ORAL
  Filled 2018-06-22 (×7): qty 1

## 2018-06-22 MED ORDER — METOCLOPRAMIDE HCL 5 MG/ML IJ SOLN: 5.0000 mg | Freq: Once | INTRAMUSCULAR | Status: DC

## 2018-06-22 MED ORDER — PANCRELIPASE (LIP-PROT-AMYL) 12000-38000 UNITS PO CPEP
12000.0000 [IU] | ORAL_CAPSULE | Freq: Three times a day (TID) | ORAL | Status: DC
  Administered 2018-06-22 – 2018-06-24 (×4): 12000 [IU] via ORAL
  Filled 2018-06-22 (×7): qty 1

## 2018-06-22 MED ORDER — POLYETHYLENE GLYCOL 3350 17 G PO PACK: 17.0000 g | PACK | Freq: Every day | ORAL | Status: DC | PRN

## 2018-06-22 MED ORDER — ZOLPIDEM TARTRATE 5 MG PO TABS
5.0000 mg | ORAL_TABLET | Freq: Every evening | ORAL | Status: DC | PRN
  Administered 2018-06-22: 5 mg via ORAL
  Filled 2018-06-22: qty 1
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
## 2018-06-22 NOTE — ED Triage Notes (Signed)
C/o abd. Pain with vomiting onset several days ago, states he had dialysis on fri, and is due today

## 2018-06-22 NOTE — ED Notes (Signed)
Pt refusing oral contrast despite reglan and morphine to help with nausea and pain.

## 2018-06-22 NOTE — Plan of Care (Signed)
  Problem: Education: Goal: Knowledge of General Education information will improve Description Including pain rating scale, medication(s)/side effects and non-pharmacologic comfort measures Outcome: Progressing
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
## 2018-06-23 DIAGNOSIS — I132 Hypertensive heart and chronic kidney disease with heart failure and with stage 5 chronic kidney disease, or end stage renal disease: Secondary | ICD-10-CM | POA: Diagnosis not present

## 2018-06-23 DIAGNOSIS — E1122 Type 2 diabetes mellitus with diabetic chronic kidney disease: Secondary | ICD-10-CM | POA: Diagnosis not present

## 2018-06-23 DIAGNOSIS — D631 Anemia in chronic kidney disease: Secondary | ICD-10-CM | POA: Diagnosis not present

## 2018-06-23 DIAGNOSIS — Z992 Dependence on renal dialysis: Secondary | ICD-10-CM | POA: Diagnosis not present

## 2018-06-23 DIAGNOSIS — N186 End stage renal disease: Secondary | ICD-10-CM | POA: Diagnosis not present

## 2018-06-23 DIAGNOSIS — I5043 Acute on chronic combined systolic (congestive) and diastolic (congestive) heart failure: Secondary | ICD-10-CM | POA: Diagnosis not present

## 2018-06-23 DIAGNOSIS — I313 Pericardial effusion (noninflammatory): Secondary | ICD-10-CM | POA: Diagnosis not present

## 2018-06-23 DIAGNOSIS — E1165 Type 2 diabetes mellitus with hyperglycemia: Secondary | ICD-10-CM | POA: Diagnosis not present

## 2018-06-23 DIAGNOSIS — F1120 Opioid dependence, uncomplicated: Secondary | ICD-10-CM | POA: Diagnosis not present

## 2018-06-23 DIAGNOSIS — I10 Essential (primary) hypertension: Secondary | ICD-10-CM | POA: Diagnosis not present

## 2018-06-23 DIAGNOSIS — I428 Other cardiomyopathies: Secondary | ICD-10-CM | POA: Diagnosis not present

## 2018-06-23 DIAGNOSIS — R1010 Upper abdominal pain, unspecified: Secondary | ICD-10-CM

## 2018-06-23 DIAGNOSIS — G894 Chronic pain syndrome: Secondary | ICD-10-CM | POA: Diagnosis not present

## 2018-06-23 DIAGNOSIS — N189 Chronic kidney disease, unspecified: Secondary | ICD-10-CM

## 2018-06-23 DIAGNOSIS — K529 Noninfective gastroenteritis and colitis, unspecified: Secondary | ICD-10-CM | POA: Diagnosis not present

## 2018-06-23 DIAGNOSIS — I2510 Atherosclerotic heart disease of native coronary artery without angina pectoris: Secondary | ICD-10-CM | POA: Diagnosis not present

## 2018-06-23 DIAGNOSIS — N281 Cyst of kidney, acquired: Secondary | ICD-10-CM | POA: Diagnosis not present

## 2018-06-23 DIAGNOSIS — K859 Acute pancreatitis without necrosis or infection, unspecified: Secondary | ICD-10-CM | POA: Diagnosis not present

## 2018-06-23 DIAGNOSIS — D899 Disorder involving the immune mechanism, unspecified: Secondary | ICD-10-CM | POA: Diagnosis not present

## 2018-06-23 DIAGNOSIS — R109 Unspecified abdominal pain: Secondary | ICD-10-CM | POA: Diagnosis present

## 2018-06-23 DIAGNOSIS — N2581 Secondary hyperparathyroidism of renal origin: Secondary | ICD-10-CM | POA: Diagnosis not present

## 2018-06-23 DIAGNOSIS — I700 Atherosclerosis of aorta: Secondary | ICD-10-CM | POA: Diagnosis not present

## 2018-06-23 DIAGNOSIS — K861 Other chronic pancreatitis: Secondary | ICD-10-CM | POA: Diagnosis not present

## 2018-06-23 DIAGNOSIS — T8612 Kidney transplant failure: Secondary | ICD-10-CM | POA: Diagnosis not present

## 2018-06-23 DIAGNOSIS — E875 Hyperkalemia: Secondary | ICD-10-CM | POA: Diagnosis not present

## 2018-06-23 DIAGNOSIS — I5042 Chronic combined systolic (congestive) and diastolic (congestive) heart failure: Secondary | ICD-10-CM | POA: Diagnosis not present

## 2018-06-23 LAB — CBC
HCT: 28.5 % — ABNORMAL LOW (ref 39.0–52.0)
Hemoglobin: 8.6 g/dL — ABNORMAL LOW (ref 13.0–17.0)
MCH: 25.2 pg — ABNORMAL LOW (ref 26.0–34.0)
MCHC: 30.2 g/dL (ref 30.0–36.0)
MCV: 83.6 fL (ref 80.0–100.0)
Platelets: 193 10*3/uL (ref 150–400)
RBC: 3.41 MIL/uL — ABNORMAL LOW (ref 4.22–5.81)
RDW: 17.8 % — ABNORMAL HIGH (ref 11.5–15.5)
WBC: 4.4 10*3/uL (ref 4.0–10.5)
nRBC: 0 % (ref 0.0–0.2)

## 2018-06-23 LAB — BASIC METABOLIC PANEL
ANION GAP: 18 — AB (ref 5–15)
BUN: 74 mg/dL — ABNORMAL HIGH (ref 6–20)
CALCIUM: 8.3 mg/dL — AB (ref 8.9–10.3)
CO2: 21 mmol/L — ABNORMAL LOW (ref 22–32)
Chloride: 97 mmol/L — ABNORMAL LOW (ref 98–111)
Creatinine, Ser: 12.24 mg/dL — ABNORMAL HIGH (ref 0.61–1.24)
GFR calc Af Amer: 5 mL/min — ABNORMAL LOW (ref >60)
GFR calc non Af Amer: 4 mL/min — ABNORMAL LOW (ref >60)
GLUCOSE: 57 mg/dL — AB (ref 70–99)
Potassium: 7.4 mmol/L (ref 3.5–5.1)
Sodium: 136 mmol/L (ref 135–145)

## 2018-06-23 LAB — GLUCOSE, CAPILLARY
Glucose-Capillary: 76 mg/dL (ref 70–99)
Glucose-Capillary: 104 mg/dL — ABNORMAL HIGH (ref 70–99)
Glucose-Capillary: 96 mg/dL (ref 70–99)

## 2018-06-23 MED ORDER — SODIUM ZIRCONIUM CYCLOSILICATE 10 G PO PACK
10.0000 g | PACK | Freq: Once | ORAL | Status: AC
  Administered 2018-06-23: 10 g via ORAL
  Filled 2018-06-23: qty 1

## 2018-06-23 MED ORDER — LIDOCAINE HCL (PF) 1 % IJ SOLN: 5.0000 mL | INTRAMUSCULAR | Status: DC | PRN

## 2018-06-23 MED ORDER — ATROPINE SULFATE 1 MG/10ML IJ SOSY
0.4000 mg | PREFILLED_SYRINGE | INTRAMUSCULAR | Status: AC
  Administered 2018-06-23: 0.4 mg via INTRAVENOUS

## 2018-06-23 MED ORDER — ATROPINE SULFATE 1 MG/10ML IJ SOSY: 0.4000 mg | PREFILLED_SYRINGE | Freq: Once | INTRAMUSCULAR | Status: DC

## 2018-06-23 MED ORDER — LIDOCAINE-PRILOCAINE 2.5-2.5 % EX CREA: 1.0000 "application " | TOPICAL_CREAM | CUTANEOUS | Status: DC | PRN

## 2018-06-23 MED ORDER — HEPARIN SODIUM (PORCINE) 1000 UNIT/ML DIALYSIS: 40.0000 [IU]/kg | INTRAMUSCULAR | Status: DC | PRN

## 2018-06-23 MED ORDER — CALCIUM GLUCONATE 10 % IV SOLN: 1.0000 g | Freq: Once | INTRAVENOUS | Status: DC

## 2018-06-23 MED ORDER — SODIUM POLYSTYRENE SULFONATE 15 GM/60ML PO SUSP: 30.0000 g | Freq: Once | ORAL | Status: DC

## 2018-06-23 MED ORDER — SODIUM CHLORIDE 0.9 % IV SOLN: 100.0000 mL | INTRAVENOUS | Status: DC | PRN

## 2018-06-23 MED ORDER — HEPARIN SODIUM (PORCINE) 1000 UNIT/ML DIALYSIS: 1000.0000 [IU] | INTRAMUSCULAR | Status: DC | PRN

## 2018-06-23 MED ORDER — ATROPINE SULFATE 0.4 MG/ML IJ SOLN
0.4000 mg | INTRAMUSCULAR | Status: DC
  Filled 2018-06-23 (×2): qty 1

## 2018-06-23 MED ORDER — ATROPINE SULFATE 1 MG/10ML IJ SOSY
PREFILLED_SYRINGE | INTRAMUSCULAR | Status: AC
  Filled 2018-06-23: qty 10

## 2018-06-23 MED ORDER — CALCIUM GLUCONATE-NACL 1-0.675 GM/50ML-% IV SOLN
1.0000 g | Freq: Once | INTRAVENOUS | Status: AC
  Administered 2018-06-23: 1000 mg via INTRAVENOUS
  Filled 2018-06-23: qty 50

## 2018-06-23 MED ORDER — ALTEPLASE 2 MG IJ SOLR: 2.0000 mg | Freq: Once | INTRAMUSCULAR | Status: DC | PRN

## 2018-06-23 MED ORDER — INSULIN ASPART 100 UNIT/ML IV SOLN
10.0000 [IU] | Freq: Once | INTRAVENOUS | Status: AC
  Administered 2018-06-23: 10 [IU] via INTRAVENOUS

## 2018-06-23 MED ORDER — PENTAFLUOROPROP-TETRAFLUOROETH EX AERO: 1.0000 "application " | INHALATION_SPRAY | CUTANEOUS | Status: DC | PRN

## 2018-06-23 MED ORDER — DEXTROSE 50 % IV SOLN
1.0000 | Freq: Once | INTRAVENOUS | Status: AC
  Administered 2018-06-23: 50 mL via INTRAVENOUS
  Filled 2018-06-23: qty 50

## 2018-06-23 MED ORDER — OXYCODONE HCL 5 MG PO TABS
5.0000 mg | ORAL_TABLET | Freq: Four times a day (QID) | ORAL | Status: DC | PRN
  Administered 2018-06-24: 5 mg via ORAL
  Filled 2018-06-23: qty 1

## 2018-06-23 MED ORDER — CHLORHEXIDINE GLUCONATE CLOTH 2 % EX PADS
6.0000 | MEDICATED_PAD | Freq: Every day | CUTANEOUS | Status: DC
  Administered 2018-06-23: 6 via TOPICAL
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
## 2018-06-23 NOTE — Progress Notes (Signed)
CRITICAL VALUE STICKER  CRITICAL VALUE: potassium 7.4   DATE & TIME NOTIFIED: 7:27 AM   MD NOTIFIED: 7:27 AM  TIME OF NOTIFICATION: 7:27 AM   RESPONSE: MD notified in person no new orders at this time

## 2018-06-23 NOTE — Procedures (Signed)
Patient seen on Hemodialysis. QB 400, UF goal 5.5L   Elmarie Shiley MD [facility]. Office # [phone] Pager # [phone] 2:21 PM

## 2018-06-23 NOTE — Discharge Instructions (Signed)

## 2018-06-23 NOTE — Progress Notes (Addendum)
[facility] PROGRESS NOTE  QUINTERIUS GAIDA HID:[id number] DOB: 02/15/1966 DOA: 06/22/2018   PCP: Patient, [name]     Assessment/Plan: Principal Problem:   Abdominal pain Active Problems:   Anemia of chronic kidney failure   DM (diabetes mellitus), type 2, uncontrolled, with renal complications (HCC)   Accelerated hypertension   Chronic combined systolic and diastolic CHF (congestive heart failure) (HCC)   Pleural effusion, right   Dialysis patient, noncompliant ([place])   Chronic pancreatitis ([place])   52 y.o. male with medical history significant of ESRD on HD s/p failed renal transplant, with HD noncompliance; HTN; acute on chronic pancreatitis with chronic pain and opioid dependence; PE with noncompliance on AC; and chronic combined CHF presenting with abdominal pain. CT scan concerning for regional enteritis, stable 3 cm left renal mass possibly representing indolent neoplasm. Patient also known to have chronic right pleural effusion , status post right thoracentesis on 11/17 at [place]. Discharged from [place] on 11/25. Now admitted for abdominal pain primarily  Assessment and plan Abdominal pain with chronic pancreatitis -Patient with chronic abdominal pain presenting with abdominal pain with reported n/v likely secondary to enteritis seen on CT -requesting renal diet - avoid IV narcotics because of bradycardia -Will continue home oxycodone for now -   -Will continue home Creon -Anticipate d/c to home tomorrow after HD , once cardiac issues resolve  Hyperkalemia patient is known to be noncompliant with HD Potassium 7.4 this morning, Dr. Posey Pronto from nephrology notified Patient noted to be bradycardic and placed on telemetry, bradycardia likely secondary to hyperkalemia and low clearance of Coreg Also treated with Lokelma , IV insulin and D50 Received atropine 0.4 mg for his bradycardia , discontinue Coreg, notified nephrology of need for urgent HD  R pleural  effusion -He has a loculated effusion on CT which does not appear to be particularly different than on prior study at [facility] -Pericardial effusion also appears unchanged -Suspect that these issues are the result of chronic volume overload in the setting of CHF and HD non-compliance -Prior plan was for management with fluid restriction and HD compliance and this does continue to appear to be reasonable at this time -He was referred for outpatient pulm consult  , when asked he is unaware of this Need to ensure that the patient has pulmonary/CTS follow-up  On discharge   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  He also had a moderate pericardial effusion without evidence of tamponade. -Management with HD, as previously discussed  DM -Appears to be appropriately diet controlled at this time  Malignant HTN -Continue home meds - awaiting med rec to order but he appears to be taking Norvasc, Coreg, hydralazine, and lisinopril  Chronic pain reviewed this patient in the [place] Controlled Substances Reporting System.  He is receiving medications from multiple providers and appears to be at particularly high risk of opioid misuse, diversion, or overdose. Ongoing very judicious use of pain medications is recommended for this patient.   DVT prophylaxsis Eliquis  Code Status:  Full code    Family Communication: Discussed in detail with the patient, all imaging results, lab results explained to the patient   Disposition Plan:   Anticipate discharge in 1-2 days     Consultants:  nephrology  Procedures:  None  Antibiotics: Anti-infectives (From admission, onward)   None         HPI/Subjective: Noted to be bradycardic, nauseous , this morning Placed on telemetry due to potassium  of 7.4 Requesting IV morphine over oxycodone  Objective: Vitals:   06/22/18 1242 06/22/18 1406 06/22/18 1628 06/22/18 1646  BP: (!) 173/89 (!) 174/77 (!) 198/102 (!) 184/130  Pulse: 88 82 90 95  Resp: _0 Temp:    98 F (36.7 C)  TempSrc:    Oral  SpO2: 98% 98% 96% 100%  Weight:      Height:       No intake or output data in the 24 hours ending 06/23/18 0940  Exam:  Examination:  General exam: Appears calm and comfortable  Respiratory system: Clear to auscultation. Respiratory effort normal. Cardiovascular system: S1 & S2 heard, RRR. No JVD, murmurs, rubs, gallops or clicks. No pedal edema. Gastrointestinal system: Abdomen is nondistended, soft and nontender. No organomegaly or masses felt. Normal bowel sounds heard. Central nervous system: Alert and oriented. No focal neurological deficits. Extremities: Symmetric 5 x 5 power. Skin: No rashes, lesions or ulcers Psychiatry: Judgement and insight appear normal. Mood & affect appropriate.     Data Reviewed: I have personally reviewed following labs and imaging studies  Micro Results No results found for this or any previous visit (from the past 240 hour(s)).  Radiology Reports Ct Abdomen Pelvis Wo Contrast  Result Date: 06/22/2018 CLINICAL DATA:  Abdominal pain and vomiting several days.  Dialysis. EXAM: CT ABDOMEN AND PELVIS WITHOUT CONTRAST TECHNIQUE: Multidetector CT imaging of the abdomen and pelvis was performed following the standard protocol without IV contrast. COMPARISON:  06/02/2018 and 05/09/2018, 06/24/2017 FINDINGS: Lower chest: Subtle reticulonodular density over the posterior left lower lobe. Moderate compressive atelectasis over the right lower lobe without significant change. Stable moderate size right pleural effusion with new collections of air and air-fluid levels within this pleural fluid collection. Findings may be due to bronchopleural fistula or empyema/infection. Stable cardiomegaly with stable  small to moderate pericardial effusion. Calcified plaque over the right coronary artery. Calcified plaque over the descending thoracic aorta. Hepatobiliary: Gallbladder, liver and biliary tree are within normal. Pancreas: Normal. Spleen: Normal. Adrenals/Urinary Tract: Adrenal glands are normal. Bilateral atrophic kidneys with stable cyst over the upper pole left kidney. There is a more indeterminate solid appearing mass over the mid pole left kidney measuring 2.7 x 3 cm without significant change from the recent prior exam although smaller compared to 2018. Ureters and bladder are normal. Stable failed left lower quadrant/pelvic renal transplant. Stomach/Bowel: Stomach is unremarkable. Mild small bowel wall thickening over the left abdomen nonspecific. Single mildly dilated small bowel loop over the right mid abdomen. Appendix not well visualized. Colon is unremarkable. Vascular/Lymphatic: Moderate calcified plaque over the abdominal aorta and iliac arteries. No adenopathy. Reproductive: Unremarkable. Other: No significant free fluid or focal inflammatory change. Musculoskeletal: Unchanged. IMPRESSION: Single minimally dilated small bowel loop over the right mid to lower abdomen with a few thick-walled small bowel loops in the left abdomen. Findings nonspecific and may be due to regional enteritis of infectious or inflammatory nature. Moderate size right pleural effusion with new foci of pleural air and pleural air-fluid levels suggesting bronchopleural fistula or empyema/infection. Right basilar atelectasis. Mild reticulonodular opacification over the left lower lobe which may be due to infection or inflammatory process. Cardiomegaly with small to moderate pericardial effusion unchanged. Atrophic kidneys with stable 3 cm indeterminate left renal mass possibly representing  indolent neoplasm as suggested previously. Left renal cyst. Stable partially calcified left lower quadrant/pelvic transplant kidney. Aortic  Atherosclerosis (ICD10-I70.0). Atherosclerotic coronary artery disease. Electronically Signed   By: Marin Olp M.D.   On: 06/22/2018 14:04   Dg Chest 2 View  Result Date: 06/15/2018 CLINICAL DATA:  Shortness of breath. EXAM: CHEST - 2 VIEW COMPARISON:  06/13/2018 FINDINGS: Stable cardiomegaly and diffuse interstitial edema pattern. Moderate right pleural effusion is unchanged in size. Air-fluid levels again seen in the lower hemithorax, which may be due to hydropneumothorax or pulmonary cavitation. IMPRESSION: Stable cardiomegaly and interstitial pulmonary edema. Stable moderate right pleural effusion. Air-fluid levels may be secondary to hydropneumothorax or pulmonary cavitation. Electronically Signed   By: Earle Gell M.D.   On: 06/15/2018 21:44   Ct Abdomen Pelvis W Contrast  Result Date: 06/02/2018 CLINICAL DATA:  Acute onset of shortness of breath. Patient on dialysis. EXAM: CT ABDOMEN AND PELVIS WITH CONTRAST TECHNIQUE: Multidetector CT imaging of the abdomen and pelvis was performed using the standard protocol following bolus administration of intravenous contrast. CONTRAST:  165m OMNIPAQUE IOHEXOL 300 MG/ML  SOLN COMPARISON:  CT of the abdomen and pelvis performed 05/09/2018 FINDINGS: Lower chest: A moderate right-sided pleural effusion is noted, with partial consolidation of the right lower lobe. A small pericardial effusion is identified. Hepatobiliary: The liver is unremarkable in appearance. The gallbladder is unremarkable in appearance. The common bile duct remains normal in caliber. Pancreas: The pancreas is within normal limits. Spleen: The spleen is unremarkable in appearance. Adrenals/Urinary Tract: The adrenal glands are unremarkable in appearance. There is a mildly heterogeneous 3.1 x 3.1 cm mass arising at the interpole region of the left kidney, raising suspicion for indolent renal cell carcinoma. It has increased in size from 2.3 cm in 2015, though it has decreased slightly in  size since 2018. Severe bilateral renal atrophy is again noted. A left renal cyst is also seen. There is no evidence of hydronephrosis. No renal or ureteral stones are identified. Stomach/Bowel: The stomach is unremarkable in appearance. The small bowel is within normal limits. The appendix is not visualized; there is no evidence for appendicitis. The colon is unremarkable in appearance. Vascular/Lymphatic: Diffuse calcification is seen along the abdominal aorta and its branches. There is mild-to-moderate luminal narrowing at the proximal left common iliac artery, with associated minimal aneurysmal dilatation of the left common iliac artery to 2.2 cm in diameter. The inferior vena cava is grossly unremarkable. No retroperitoneal lymphadenopathy is seen. No pelvic sidewall lymphadenopathy is identified. Reproductive: The soft tissue mass with vascular calcifications at the left iliac fossa reflects an atrophied transplant kidney. The bladder is decompressed and not well characterized. The prostate remains normal in size. Other: No additional soft tissue abnormalities are seen. Musculoskeletal: No acute osseous abnormalities are identified. The visualized musculature is unremarkable in appearance. IMPRESSION: 1. Moderate right-sided pleural effusion, with partial consolidation of the right lower lung lobe. This may explain the patient's shortness of breath. 2. Mildly heterogeneous 3.1 x 3.1 cm mass arising at the interpole region of the left kidney, raising suspicion for indolent renal cell carcinoma. It has increased in size from 2.3 cm in 2015, though it has decreased slightly in size since 2018. 3. Severe bilateral native renal atrophy, and atrophy of the transplant kidney at the left iliac fossa. Left renal cyst. 4. Mild to moderate luminal narrowing at the proximal left common iliac artery, with associated minimal aneurysmal dilatation of the left common iliac artery to 2.2 cm in diameter.  5. Small pericardial  effusion noted. Aortic Atherosclerosis (ICD10-I70.0). Electronically Signed   By: Garald Balding M.D.   On: 06/02/2018 22:43   Dg Chest Port 1 View  Result Date: 06/22/2018 CLINICAL DATA:  Abdominal pain with vomiting over the last several days. Dialysis patient. Shortness of breath. EXAM: PORTABLE CHEST 1 VIEW COMPARISON:  06/15/2018 FINDINGS: Markedly enlarged cardiac silhouette again demonstrated. The left chest is clear. Large right effusion with some loculation as seen previously. Associated atelectasis and or infiltrate within the right lung. No worsening or new finding. IMPRESSION: Very similar appearance to the study of 1 week ago. Cardiomegaly. Right effusion, partially loculated, with right lung volume loss and/or pneumonia. Electronically Signed   By: [name] M.D.   On: 06/22/2018 07:43   Dg Chest Port 1 View  Result Date: 06/02/2018 CLINICAL DATA:  Shortness of breath EXAM: PORTABLE CHEST 1 VIEW COMPARISON:  05/10/2018 FINDINGS: Unchanged moderate cardiomegaly. Right pleural effusion is increased in size. There is associated basilar atelectasis. No pneumothorax. IMPRESSION: Increased size of right pleural effusion. Electronically Signed   By: Ulyses Jarred M.D.   On: 06/02/2018 19:12   Dg Abdomen Acute W/chest  Result Date: 06/13/2018 CLINICAL DATA:  Acute shortness of breath and RIGHT abdominal pain for 4 days. EXAM: DG ABDOMEN ACUTE W/ 1V CHEST COMPARISON:  06/02/2018 abdomen/pelvis CT, chest radiograph and prior studies FINDINGS: Cardiomegaly, pulmonary vascular congestion and large RIGHT pleural effusion again noted. There is no evidence of pneumothorax. Lucencies overlying the RIGHT LOWER lung/hemithorax noted and could represent partial reexpansion of the RIGHT LOWER lung but appearance is slightly unusual and cavitary changes or pleural air is not excluded. The bowel gas pattern is unremarkable. No dilated bowel loops or pneumoperitoneum. No suspicious calcifications are  identified. No acute bony abnormalities are present. IMPRESSION: 1. Large RIGHT pleural effusion again noted with lucencies overlying the RIGHT LOWER lung/hemithorax. These lucencies could represent partial re-expansion of the LEFT LOWER lung, pulmonary cavitary changes or possibly pulmonary air. Correlate with any recent thoracentesis/pleural intervention. A LATERAL chest radiograph may be helpful for localization as initial evaluation, as clinically indicated. 2. Unremarkable bowel gas pattern. Electronically Signed   By: Margarette Canada M.D.   On: 06/13/2018 08:07     CBC Recent Labs  Lab 06/22/18 0630 06/23/18 0546  WBC 4.6 4.4  HGB 9.4* 8.6*  HCT 32.2* 28.5*  PLT 237 193  MCV 85.9 83.6  MCH 25.1* 25.2*  MCHC 29.2* 30.2  RDW 18.3* 17.8*    Chemistries  Recent Labs  Lab 06/22/18 0630 06/23/18 0546  NA 138 136  K 4.6 7.4*  CL 96* 97*  CO2 27 21*  GLUCOSE 84 57*  BUN 64* 74*  CREATININE 10.81* 12.24*  CALCIUM 9.2 8.3*  AST 18  --   ALT 10  --   ALKPHOS 122  --   BILITOT 0.9  --    ------------------------------------------------------------------------------------------------------------------ estimated creatinine clearance is 7.3 mL/min (A) (by C-G formula based on SCr of 12.24 mg/dL (H)). ------------------------------------------------------------------------------------------------------------------ No results for input(s): HGBA1C in the last 72 hours. ------------------------------------------------------------------------------------------------------------------ No results for input(s): CHOL, HDL, LDLCALC, TRIG, CHOLHDL, LDLDIRECT in the last 72 hours. ------------------------------------------------------------------------------------------------------------------ No results for input(s): TSH, T4TOTAL, T3FREE, THYROIDAB in the last 72 hours.  Invalid input(s):  FREET3 ------------------------------------------------------------------------------------------------------------------ No results for input(s): VITAMINB12, FOLATE, FERRITIN, TIBC, IRON, RETICCTPCT in the last 72 hours.  Coagulation profile No results for input(s): INR, PROTIME in the last 168 hours.  No results for input(s): DDIMER in the  last 72 hours.  Cardiac Enzymes No results for input(s): CKMB, TROPONINI, MYOGLOBIN in the last 168 hours.  Invalid input(s): CK ------------------------------------------------------------------------------------------------------------------ Invalid input(s): POCBNP   CBG: Recent Labs  Lab 06/23/18 0815  GLUCAP 76       Studies: Ct Abdomen Pelvis Wo Contrast  Result Date: 06/22/2018 CLINICAL DATA:  Abdominal pain and vomiting several days.  Dialysis. EXAM: CT ABDOMEN AND PELVIS WITHOUT CONTRAST TECHNIQUE: Multidetector CT imaging of the abdomen and pelvis was performed following the standard protocol without IV contrast. COMPARISON:  06/02/2018 and 05/09/2018, 06/24/2017 FINDINGS: Lower chest: Subtle reticulonodular density over the posterior left lower lobe. Moderate compressive atelectasis over the right lower lobe without significant change. Stable moderate size right pleural effusion with new collections of air and air-fluid levels within this pleural fluid collection. Findings may be due to bronchopleural fistula or empyema/infection. Stable cardiomegaly with stable small to moderate pericardial effusion. Calcified plaque over the right coronary artery. Calcified plaque over the descending thoracic aorta. Hepatobiliary: Gallbladder, liver and biliary tree are within normal. Pancreas: Normal. Spleen: Normal. Adrenals/Urinary Tract: Adrenal glands are normal. Bilateral atrophic kidneys with stable cyst over the upper pole left kidney. There is a more indeterminate solid appearing mass over the mid pole left kidney measuring 2.7 x 3 cm without  significant change from the recent prior exam although smaller compared to 2018. Ureters and bladder are normal. Stable failed left lower quadrant/pelvic renal transplant. Stomach/Bowel: Stomach is unremarkable. Mild small bowel wall thickening over the left abdomen nonspecific. Single mildly dilated small bowel loop over the right mid abdomen. Appendix not well visualized. Colon is unremarkable. Vascular/Lymphatic: Moderate calcified plaque over the abdominal aorta and iliac arteries. No adenopathy. Reproductive: Unremarkable. Other: No significant free fluid or focal inflammatory change. Musculoskeletal: Unchanged. IMPRESSION: Single minimally dilated small bowel loop over the right mid to lower abdomen with a few thick-walled small bowel loops in the left abdomen. Findings nonspecific and may be due to regional enteritis of infectious or inflammatory nature. Moderate size right pleural effusion with new foci of pleural air and pleural air-fluid levels suggesting bronchopleural fistula or empyema/infection. Right basilar atelectasis. Mild reticulonodular opacification over the left lower lobe which may be due to infection or inflammatory process. Cardiomegaly with small to moderate pericardial effusion unchanged. Atrophic kidneys with stable 3 cm indeterminate left renal mass possibly representing indolent neoplasm as suggested previously. Left renal cyst. Stable partially calcified left lower quadrant/pelvic transplant kidney. Aortic Atherosclerosis (ICD10-I70.0). Atherosclerotic coronary artery disease. Electronically Signed   By: Marin Olp M.D.   On: 06/22/2018 14:04   Dg Chest Port 1 View  Result Date: 06/22/2018 CLINICAL DATA:  Abdominal pain with vomiting over the last several days. Dialysis patient. Shortness of breath. EXAM: PORTABLE CHEST 1 VIEW COMPARISON:  06/15/2018 FINDINGS: Markedly enlarged cardiac silhouette again demonstrated. The left chest is clear. Large right effusion with some  loculation as seen previously. Associated atelectasis and or infiltrate within the right lung. No worsening or new finding. IMPRESSION: Very similar appearance to the study of 1 week ago. Cardiomegaly. Right effusion, partially loculated, with right lung volume loss and/or pneumonia. Electronically Signed   By: [name] M.D.   On: 06/22/2018 07:43      Lab Results  Component Value Date   HGBA1C 5.2 04/21/2017   HGBA1C 4.6 (L) 10/05/2016   HGBA1C 5.5 08/04/2016   Lab Results  Component Value Date   LDLCALC 53 08/09/2017   CREATININE 12.24 (H) 06/23/2018  Scheduled Meds: . amLODipine  5 mg Oral Daily  . apixaban  5 mg Oral BID  . atropine  0.4 mg Intravenous Once  . busPIRone  5 mg Oral BID  . calcium gluconate  1 g Intravenous Once  . Chlorhexidine Gluconate Cloth  6 each Topical Q0600  . cinacalcet  90 mg Oral QPM  . dextrose  1 ampule Intravenous Once  . gabapentin  300 mg Oral QPM  . hydrALAZINE  100 mg Oral TID  . insulin aspart  10 Units Intravenous Once  . lanthanum  1,000 mg Oral TID WC  . lipase/protease/amylase  12,000 Units Oral TID AC  . multivitamin  1 tablet Oral QHS  . pantoprazole  40 mg Oral Daily   Continuous Infusions:   LOS: 0 days    Time spent: >30 MINS    Reyne Dumas  [facility] Pager [phone]. If 7PM-7AM, please contact night-coverage at [email or web address], password [email or web address] 06/23/2018, 9:40 AM  LOS: 0 days

## 2018-06-23 NOTE — Progress Notes (Signed)
Paged MD pt HR 37 and symptomatic Pt other vital signs stable. See orders

## 2018-06-23 NOTE — Progress Notes (Signed)
Patient arrived to 4E room 07 at this time. Telemetry applied and CCMD notified. V/s and assessment complete. Patient oriented to room and how to call nurse with any needs.   Emelda Fear, RN

## 2018-06-24 DIAGNOSIS — E875 Hyperkalemia: Secondary | ICD-10-CM | POA: Diagnosis not present

## 2018-06-24 DIAGNOSIS — R1010 Upper abdominal pain, unspecified: Secondary | ICD-10-CM | POA: Diagnosis not present

## 2018-06-24 DIAGNOSIS — Z992 Dependence on renal dialysis: Secondary | ICD-10-CM | POA: Diagnosis not present

## 2018-06-24 DIAGNOSIS — Z9115 Patient's noncompliance with renal dialysis: Secondary | ICD-10-CM | POA: Diagnosis not present

## 2018-06-24 DIAGNOSIS — I5043 Acute on chronic combined systolic (congestive) and diastolic (congestive) heart failure: Secondary | ICD-10-CM | POA: Diagnosis not present

## 2018-06-24 DIAGNOSIS — N186 End stage renal disease: Secondary | ICD-10-CM | POA: Diagnosis not present

## 2018-06-24 DIAGNOSIS — I10 Essential (primary) hypertension: Secondary | ICD-10-CM | POA: Diagnosis not present

## 2018-06-24 DIAGNOSIS — R109 Unspecified abdominal pain: Secondary | ICD-10-CM | POA: Diagnosis not present

## 2018-06-24 DIAGNOSIS — I132 Hypertensive heart and chronic kidney disease with heart failure and with stage 5 chronic kidney disease, or end stage renal disease: Secondary | ICD-10-CM | POA: Diagnosis not present

## 2018-06-24 DIAGNOSIS — N189 Chronic kidney disease, unspecified: Secondary | ICD-10-CM | POA: Diagnosis not present

## 2018-06-24 DIAGNOSIS — D631 Anemia in chronic kidney disease: Secondary | ICD-10-CM | POA: Diagnosis not present

## 2018-06-24 DIAGNOSIS — K859 Acute pancreatitis without necrosis or infection, unspecified: Secondary | ICD-10-CM | POA: Diagnosis not present

## 2018-06-24 DIAGNOSIS — N2581 Secondary hyperparathyroidism of renal origin: Secondary | ICD-10-CM | POA: Diagnosis not present

## 2018-06-24 LAB — CBC
HCT: 29.6 % — ABNORMAL LOW (ref 39.0–52.0)
Hemoglobin: 8.8 g/dL — ABNORMAL LOW (ref 13.0–17.0)
MCH: 25.4 pg — ABNORMAL LOW (ref 26.0–34.0)
MCHC: 29.7 g/dL — ABNORMAL LOW (ref 30.0–36.0)
MCV: 85.5 fL (ref 80.0–100.0)
Platelets: 189 10*3/uL (ref 150–400)
RBC: 3.46 MIL/uL — ABNORMAL LOW (ref 4.22–5.81)
RDW: 18.3 % — ABNORMAL HIGH (ref 11.5–15.5)
WBC: 4.6 10*3/uL (ref 4.0–10.5)
nRBC: 0 % (ref 0.0–0.2)

## 2018-06-24 LAB — RENAL FUNCTION PANEL
ANION GAP: 14 (ref 5–15)
Albumin: 2.4 g/dL — ABNORMAL LOW (ref 3.5–5.0)
BUN: 36 mg/dL — ABNORMAL HIGH (ref 6–20)
CO2: 26 mmol/L (ref 22–32)
Calcium: 7.7 mg/dL — ABNORMAL LOW (ref 8.9–10.3)
Chloride: 97 mmol/L — ABNORMAL LOW (ref 98–111)
Creatinine, Ser: 8.05 mg/dL — ABNORMAL HIGH (ref 0.61–1.24)
GFR calc Af Amer: 8 mL/min — ABNORMAL LOW (ref >60)
GFR calc non Af Amer: 7 mL/min — ABNORMAL LOW (ref >60)
Glucose, Bld: 140 mg/dL — ABNORMAL HIGH (ref 70–99)
Phosphorus: 3.7 mg/dL (ref 2.5–4.6)
Potassium: 4.2 mmol/L (ref 3.5–5.1)
Sodium: 137 mmol/L (ref 135–145)

## 2018-06-24 NOTE — Procedures (Signed)
Patient seen on Hemodialysis. QB 400, UF goal 2L Reports that he will not stay for prescribed time.  Elmarie Shiley MD [facility]. Office # [phone] Pager # [phone] 1:31 PM

## 2018-06-24 NOTE — Discharge Summary (Signed)
Discharge Summary  Frank [name] ERX:[id number] DOB: 1966-03-05  PCP: Patient, [name]  Admit date: 06/22/2018 Discharge date: 06/24/2018  Time spent: 35 minutes  Recommendations for Outpatient Follow-up:  1. Follow-up with nephrology 2. Take your medications as prescribed 3. Continue hemodialysis on your scheduled days  Discharge Diagnoses:  Active Hospital Problems   Diagnosis Date Noted  . Abdominal pain   . Chronic pancreatitis ([place]) 05/09/2018  . Dialysis patient, noncompliant ([place]) 03/05/2018  . Pleural effusion, right 01/31/2018  . Chronic combined systolic and diastolic CHF (congestive heart failure) ([place]) 09/23/2015  . Accelerated hypertension 11/29/2014  . DM (diabetes mellitus), type 2, uncontrolled, with renal complications ([place])   . Anemia of chronic kidney failure 06/24/2013    Resolved Hospital Problems  No resolved problems to display.    Discharge Condition: Stable  Diet recommendation: Resume previous diet, renal dialysis diet  Vitals:   06/24/18 0751 06/24/18 0839  BP: 114/75   Pulse: 80 76  Resp: 14   Temp: 97.8 F (36.6 C)   SpO2: 95%     History of present illness:   52 y.o.malewith medical history significant ofESRD on HD s/p failed renal transplant, with HD noncompliance; HTN; acute on chronic pancreatitis with chronic pain and opioid dependence; PE with noncompliance on AC; and chronic combined CHF presenting with abdominal pain.CT scan concerning for regional enteritis, stable 3 cm left renal mass possibly representing indolent neoplasm. Patient also known to have chronic right pleural effusion , status post right thoracentesis on 11/17 at [facility]. Discharged from [facility] on 11/25. Now admitted for abdominal pain primarily.  06/24/18: Patient seen and examined at his bedside.  No acute events overnight.  Reports his abdominal pain has resolved completely.  Denies any nausea, diarrhea, constipation.  Denies any cardiopulmonary symptoms.  He  will be hemodialyzed today in order to resume his normal hemodialysis schedule.  On the day of discharge, the patient was hemodynamically stable.  He will need to follow-up with nephrology and continue his hemodialysis as scheduled.   Hospital Course:  Principal Problem:   Abdominal pain Active Problems:   Anemia of chronic kidney failure   DM (diabetes mellitus), type 2, uncontrolled, with renal complications ([facility])   Accelerated hypertension   Chronic combined systolic and diastolic CHF (congestive heart failure) ([facility])   Pleural effusion, right   Dialysis patient, noncompliant ([place])   Chronic pancreatitis ([place])   Resolved abdominal pain with chronic pancreatitis -Patient with chronic abdominal pain now completely resolved- -continue home [place]  Hyperkalemia post hemodialysis Keep your dialysis appointments  Medical noncompliance Patient counseled on the importance of compliance with dialysis and medications Needs reinforcement  R pleural effusion -He has a loculated effusion on CT which does not appear to be particularly different than on prior study at [facility] -Pericardial effusion also appears unchanged -Suspect that these issues are the result of chronic volume overload in the setting of CHF and HD non-compliance -Prior plan was for management with fluid restriction and HD compliance and this does continue to appear to be reasonable at this time -He was referred for outpatient pulm consult, when asked he is unaware of this -Follow-up with pulmonology outpatient  ESRD on HD with non-CPL, anemia Dialysis planned today 06/24/18  Chronic combined congestive heart failure -No acute issues -Echo in 4/19 showed EF 35-40% with normal diastolic function -Echo on 06/15/18 showed EF 30-35% with severe (grade 3-4) diastolic dysfunction. He also had a moderate pericardial effusion without evidence of tamponade. -Management with  HD  Malignant HTN -Vital signs are  stable -Continue home meds  Chronic pain syndrome reviewed this patient in the [place] Controlled Substances Reporting System. He is receiving medications from multipleprovidersand appears to be at particularly high risk of opioid misuse, diversion, or overdose. Ongoing very judicious use of pain medications is recommended for this patient.   DVT prophylaxsis Eliquis  Code Status:  Full code     Consultants:  nephrology  Procedures:  None   Antibiotics:    Anti-infectives (From admission, onward)   None       Discharge Exam: BP 114/75 (BP Location: Right Arm)   Pulse 76   Temp 97.8 F (36.6 C) (Oral)   Resp 14   Ht _0  (1.88 m)   Wt 76 kg   SpO2 95%   BMI 21.51 kg/m  . General: 52 y.o. year-old male well developed well nourished in no acute distress.  Alert and oriented x3. . Cardiovascular: Regular rate and rhythm with no rubs or gallops.  No thyromegaly or JVD noted.   [name] Respiratory: Clear to auscultation with no wheezes or rales. Good inspiratory effort. . Abdomen: Soft nontender nondistended with normal bowel sounds x4 quadrants. . Musculoskeletal: No lower extremity edema. 2/4 pulses in all 4 extremities. . Skin: No ulcerative lesions noted or rashes, . Psychiatry: Mood is appropriate for condition and setting  Discharge Instructions You were cared for by a hospitalist during your hospital stay. If you have any questions about your discharge medications or the care you received while you were in the hospital after you are discharged, you can call the unit and asked to speak with the hospitalist on call if the hospitalist that took care of you is not available. Once you are discharged, your primary care physician will handle any further medical issues. Please note that NO REFILLS for any discharge medications will be authorized once you are discharged, as it is imperative that you return to your primary care physician (or establish a relationship with a  primary care physician if you do not have one) for your aftercare needs so that they can reassess your need for medications and monitor your lab values.   Allergies as of 06/24/2018      Reactions   Butalbital-apap-caffeine Shortness Of Breath, Swelling, Other (See Comments)   Swelling in throat   Ferrlecit [na Ferric Gluc Cplx In Sucrose] Shortness Of Breath, Swelling, Other (See Comments)   Swelling in throat, tolerates Venofor   Minoxidil Shortness Of Breath   Tylenol [acetaminophen] Anaphylaxis, Swelling   Darvocet [propoxyphene N-acetaminophen] Hives      Medication List    STOP taking these medications   carvedilol 25 MG tablet Commonly known as:  COREG   gabapentin 300 MG capsule Commonly known as:  NEURONTIN   hydrOXYzine 10 MG tablet Commonly known as:  ATARAX/VISTARIL   ondansetron 4 MG disintegrating tablet Commonly known as:  ZOFRAN-ODT   Oxycodone HCl 10 MG Tabs     TAKE these medications   ALPRAZolam 0.5 MG tablet Commonly known as:  XANAX Take 0.5 tablets (0.25 mg total) by mouth 2 (two) times daily as needed for anxiety or sleep.   amLODipine 5 MG tablet Commonly known as:  NORVASC Take 5 mg by mouth daily.   busPIRone 5 MG tablet Commonly known as:  BUSPAR Take 5 mg by mouth 2 (two) times daily.   cinacalcet 90 MG tablet Commonly known as:  SENSIPAR Take 90 mg by mouth every evening.  diclofenac sodium 1 % Gel Commonly known as:  VOLTAREN Apply 4 gram transdermaly four times daily for left shoulder pain   dicyclomine 10 MG/5ML syrup Commonly known as:  BENTYL Take 5 mLs by mouth 4 (four) times daily as needed for spasms.   ELIQUIS 5 MG Tabs tablet Generic drug:  apixaban Take 5 mg by mouth 2 (two) times daily.   hydrALAZINE 100 MG tablet Commonly known as:  APRESOLINE Take 100 mg by mouth 3 (three) times daily.   lanthanum 1000 MG chewable tablet Commonly known as:  FOSRENOL Chew 1,000 mg by mouth 3 (three) times daily with meals.     lipase/protease/amylase 12000 units Cpep capsule Commonly known as:  CREON Take 1 capsule (12,000 Units total) by mouth 3 (three) times daily before meals.   multivitamin Tabs tablet Take 1 tablet by mouth daily. What changed:  Another medication with the same name was removed. Continue taking this medication, and follow the directions you see here.   NEPHRO VITAMINS 0.8 MG Tabs Take 1 tablet by mouth daily. What changed:  Another medication with the same name was removed. Continue taking this medication, and follow the directions you see here.   nitroGLYCERIN 0.4 MG SL tablet Commonly known as:  NITROSTAT Place 0.4 mg under the tongue every 5 (five) minutes as needed for chest pain.   NUTRITIONAL SUPPLEMENT PO Liberalized Renal Diet - Regular Texture   OXYGEN O2 via nasal cannula at 2L/min for O2 sats 90% or below as needed for shortness of breath   pantoprazole 40 MG tablet Commonly known as:  PROTONIX Take 1 tablet (40 mg total) by mouth daily.   polyethylene glycol packet Commonly known as:  MIRALAX / GLYCOLAX Take 17 g by mouth daily. What changed:    when to take this  reasons to take this   senna-docusate 8.6-50 MG tablet Commonly known as:  Senokot-S Take 2 tablets by mouth at bedtime. What changed:    when to take this  reasons to take this   zolpidem 5 MG tablet Commonly known as:  AMBIEN Take 1 tablet (5 mg total) by mouth at bedtime.      Allergies  Allergen Reactions  . Butalbital-Apap-Caffeine Shortness Of Breath, Swelling and Other (See Comments)    Swelling in throat  . Ferrlecit [Na Ferric Gluc Cplx In Sucrose] Shortness Of Breath, Swelling and Other (See Comments)    Swelling in throat, tolerates Venofor  . Minoxidil Shortness Of Breath  . Tylenol [Acetaminophen] Anaphylaxis and Swelling  . Darvocet [Propoxyphene N-Acetaminophen] Hives      The results of significant diagnostics from this hospitalization (including imaging,  microbiology, ancillary and laboratory) are listed below for reference.    Significant Diagnostic Studies: Ct Abdomen Pelvis Wo Contrast  Result Date: 06/22/2018 CLINICAL DATA:  Abdominal pain and vomiting several days.  Dialysis. EXAM: CT ABDOMEN AND PELVIS WITHOUT CONTRAST TECHNIQUE: Multidetector CT imaging of the abdomen and pelvis was performed following the standard protocol without IV contrast. COMPARISON:  06/02/2018 and 05/09/2018, 06/24/2017 FINDINGS: Lower chest: Subtle reticulonodular density over the posterior left lower lobe. Moderate compressive atelectasis over the right lower lobe without significant change. Stable moderate size right pleural effusion with new collections of air and air-fluid levels within this pleural fluid collection. Findings may be due to bronchopleural fistula or empyema/infection. Stable cardiomegaly with stable small to moderate pericardial effusion. Calcified plaque over the right coronary artery. Calcified plaque over the descending thoracic aorta. Hepatobiliary: Gallbladder, liver and biliary tree  are within normal. Pancreas: Normal. Spleen: Normal. Adrenals/Urinary Tract: Adrenal glands are normal. Bilateral atrophic kidneys with stable cyst over the upper pole left kidney. There is a more indeterminate solid appearing mass over the mid pole left kidney measuring 2.7 x 3 cm without significant change from the recent prior exam although smaller compared to 2018. Ureters and bladder are normal. Stable failed left lower quadrant/pelvic renal transplant. Stomach/Bowel: Stomach is unremarkable. Mild small bowel wall thickening over the left abdomen nonspecific. Single mildly dilated small bowel loop over the right mid abdomen. Appendix not well visualized. Colon is unremarkable. Vascular/Lymphatic: Moderate calcified plaque over the abdominal aorta and iliac arteries. No adenopathy. Reproductive: Unremarkable. Other: No significant free fluid or focal inflammatory change.  Musculoskeletal: Unchanged. IMPRESSION: Single minimally dilated small bowel loop over the right mid to lower abdomen with a few thick-walled small bowel loops in the left abdomen. Findings nonspecific and may be due to regional enteritis of infectious or inflammatory nature. Moderate size right pleural effusion with new foci of pleural air and pleural air-fluid levels suggesting bronchopleural fistula or empyema/infection. Right basilar atelectasis. Mild reticulonodular opacification over the left lower lobe which may be due to infection or inflammatory process. Cardiomegaly with small to moderate pericardial effusion unchanged. Atrophic kidneys with stable 3 cm indeterminate left renal mass possibly representing indolent neoplasm as suggested previously. Left renal cyst. Stable partially calcified left lower quadrant/pelvic transplant kidney. Aortic Atherosclerosis (ICD10-I70.0). Atherosclerotic coronary artery disease. Electronically Signed   By: Marin Olp M.D.   On: 06/22/2018 14:04   Dg Chest 2 View  Result Date: 06/15/2018 CLINICAL DATA:  Shortness of breath. EXAM: CHEST - 2 VIEW COMPARISON:  06/13/2018 FINDINGS: Stable cardiomegaly and diffuse interstitial edema pattern. Moderate right pleural effusion is unchanged in size. Air-fluid levels again seen in the lower hemithorax, which may be due to hydropneumothorax or pulmonary cavitation. IMPRESSION: Stable cardiomegaly and interstitial pulmonary edema. Stable moderate right pleural effusion. Air-fluid levels may be secondary to hydropneumothorax or pulmonary cavitation. Electronically Signed   By: Earle Gell M.D.   On: 06/15/2018 21:44   Ct Abdomen Pelvis W Contrast  Result Date: 06/02/2018 CLINICAL DATA:  Acute onset of shortness of breath. Patient on dialysis. EXAM: CT ABDOMEN AND PELVIS WITH CONTRAST TECHNIQUE: Multidetector CT imaging of the abdomen and pelvis was performed using the standard protocol following bolus administration of  intravenous contrast. CONTRAST:  171m OMNIPAQUE IOHEXOL 300 MG/ML  SOLN COMPARISON:  CT of the abdomen and pelvis performed 05/09/2018 FINDINGS: Lower chest: A moderate right-sided pleural effusion is noted, with partial consolidation of the right lower lobe. A small pericardial effusion is identified. Hepatobiliary: The liver is unremarkable in appearance. The gallbladder is unremarkable in appearance. The common bile duct remains normal in caliber. Pancreas: The pancreas is within normal limits. Spleen: The spleen is unremarkable in appearance. Adrenals/Urinary Tract: The adrenal glands are unremarkable in appearance. There is a mildly heterogeneous 3.1 x 3.1 cm mass arising at the interpole region of the left kidney, raising suspicion for indolent renal cell carcinoma. It has increased in size from 2.3 cm in 2015, though it has decreased slightly in size since 2018. Severe bilateral renal atrophy is again noted. A left renal cyst is also seen. There is no evidence of hydronephrosis. No renal or ureteral stones are identified. Stomach/Bowel: The stomach is unremarkable in appearance. The small bowel is within normal limits. The appendix is not visualized; there is no evidence for appendicitis. The colon is unremarkable in appearance. Vascular/Lymphatic:  Diffuse calcification is seen along the abdominal aorta and its branches. There is mild-to-moderate luminal narrowing at the proximal left common iliac artery, with associated minimal aneurysmal dilatation of the left common iliac artery to 2.2 cm in diameter. The inferior vena cava is grossly unremarkable. No retroperitoneal lymphadenopathy is seen. No pelvic sidewall lymphadenopathy is identified. Reproductive: The soft tissue mass with vascular calcifications at the left iliac fossa reflects an atrophied transplant kidney. The bladder is decompressed and not well characterized. The prostate remains normal in size. Other: No additional soft tissue abnormalities  are seen. Musculoskeletal: No acute osseous abnormalities are identified. The visualized musculature is unremarkable in appearance. IMPRESSION: 1. Moderate right-sided pleural effusion, with partial consolidation of the right lower lung lobe. This may explain the patient's shortness of breath. 2. Mildly heterogeneous 3.1 x 3.1 cm mass arising at the interpole region of the left kidney, raising suspicion for indolent renal cell carcinoma. It has increased in size from 2.3 cm in 2015, though it has decreased slightly in size since 2018. 3. Severe bilateral native renal atrophy, and atrophy of the transplant kidney at the left iliac fossa. Left renal cyst. 4. Mild to moderate luminal narrowing at the proximal left common iliac artery, with associated minimal aneurysmal dilatation of the left common iliac artery to 2.2 cm in diameter. 5. Small pericardial effusion noted. Aortic Atherosclerosis (ICD10-I70.0). Electronically Signed   By: Garald Balding M.D.   On: 06/02/2018 22:43   Dg Chest Port 1 View  Result Date: 06/22/2018 CLINICAL DATA:  Abdominal pain with vomiting over the last several days. Dialysis patient. Shortness of breath. EXAM: PORTABLE CHEST 1 VIEW COMPARISON:  06/15/2018 FINDINGS: Markedly enlarged cardiac silhouette again demonstrated. The left chest is clear. Large right effusion with some loculation as seen previously. Associated atelectasis and or infiltrate within the right lung. No worsening or new finding. IMPRESSION: Very similar appearance to the study of 1 week ago. Cardiomegaly. Right effusion, partially loculated, with right lung volume loss and/or pneumonia. Electronically Signed   By: [name] M.D.   On: 06/22/2018 07:43   Dg Chest Port 1 View  Result Date: 06/02/2018 CLINICAL DATA:  Shortness of breath EXAM: PORTABLE CHEST 1 VIEW COMPARISON:  05/10/2018 FINDINGS: Unchanged moderate cardiomegaly. Right pleural effusion is increased in size. There is associated basilar  atelectasis. No pneumothorax. IMPRESSION: Increased size of right pleural effusion. Electronically Signed   By: Ulyses Jarred M.D.   On: 06/02/2018 19:12   Dg Abdomen Acute W/chest  Result Date: 06/13/2018 CLINICAL DATA:  Acute shortness of breath and RIGHT abdominal pain for 4 days. EXAM: DG ABDOMEN ACUTE W/ 1V CHEST COMPARISON:  06/02/2018 abdomen/pelvis CT, chest radiograph and prior studies FINDINGS: Cardiomegaly, pulmonary vascular congestion and large RIGHT pleural effusion again noted. There is no evidence of pneumothorax. Lucencies overlying the RIGHT LOWER lung/hemithorax noted and could represent partial reexpansion of the RIGHT LOWER lung but appearance is slightly unusual and cavitary changes or pleural air is not excluded. The bowel gas pattern is unremarkable. No dilated bowel loops or pneumoperitoneum. No suspicious calcifications are identified. No acute bony abnormalities are present. IMPRESSION: 1. Large RIGHT pleural effusion again noted with lucencies overlying the RIGHT LOWER lung/hemithorax. These lucencies could represent partial re-expansion of the LEFT LOWER lung, pulmonary cavitary changes or possibly pulmonary air. Correlate with any recent thoracentesis/pleural intervention. A LATERAL chest radiograph may be helpful for localization as initial evaluation, as clinically indicated. 2. Unremarkable bowel gas pattern. Electronically Signed   By: Dellis Filbert  Hu M.D.   On: 06/13/2018 08:07    Microbiology: No results found for this or any previous visit (from the past 240 hour(s)).   Labs: Basic Metabolic Panel: Recent Labs  Lab 06/22/18 0630 06/23/18 0546  NA 138 136  K 4.6 7.4*  CL 96* 97*  CO2 27 21*  GLUCOSE 84 57*  BUN 64* 74*  CREATININE 10.81* 12.24*  CALCIUM 9.2 8.3*   Liver Function Tests: Recent Labs  Lab 06/22/18 0630  AST 18  ALT 10  ALKPHOS 122  BILITOT 0.9  PROT 8.6*  ALBUMIN 2.8*   Recent Labs  Lab 06/22/18 0630  LIPASE 56*   No results for  input(s): AMMONIA in the last 168 hours. CBC: Recent Labs  Lab 06/22/18 0630 06/23/18 0546  WBC 4.6 4.4  HGB 9.4* 8.6*  HCT 32.2* 28.5*  MCV 85.9 83.6  PLT 237 193   Cardiac Enzymes: No results for input(s): CKTOTAL, CKMB, CKMBINDEX, TROPONINI in the last 168 hours. BNP: BNP (last 3 results) Recent Labs    12/01/17 1101 02/09/18 1049  BNP >4,500.0* >4,500.0*    ProBNP (last 3 results) No results for input(s): PROBNP in the last 8760 hours.  CBG: Recent Labs  Lab 06/23/18 0815 06/23/18 1048 06/23/18 1650  GLUCAP 76 104* 96       Signed:  Kayleen Memos, MD [facility] 06/24/2018, 12:00 PM

## 2018-06-24 NOTE — Progress Notes (Signed)
L IJ removed at 4:28pm w/o difficulty.  Covered with vaseline gauze, dry dressing and tegrederm.   Care for IJ site reviewed with patient.

## 2018-06-24 NOTE — Progress Notes (Signed)
PT Cancellation Note  Patient Details Name: Frank [name] MRN: [id number] DOB: February 21, 1966   Cancelled Treatment:    Reason Eval/Treat Not Completed: Patient at procedure or test/unavailable Pt currently at HD. Will follow up as schedule allows.   Leighton Ruff, PT, DPT  Acute Rehabilitation Services  Pager: [phone] Office: [phone]  Rudean Hitt 06/24/2018, 1:20 PM

## 2018-06-24 NOTE — Progress Notes (Signed)
Patient ID: Frank [name], male   DOB: 03-26-1966, 52 y.o.   MRN: [id number]  [facility] Progress Note   Assessment/ Plan:   1.  Acute on chronic abdominal pain: History of chronic pain medications for pain control, reports pain is completely resolved and expresses intention to go home today. 2. ESRD: Underwent emergent hemodialysis yesterday for hyperkalemia after missing his dialysis on Monday.  I have ordered for dialysis again today to keep him on his usual outpatient MWF schedule.  3. Anemia: Status post recent ESA with hemodialysis, no overt loss. 4. CKD-MBD: Calcium level within acceptable range, resume phosphorus binders/VDRA. 5. Nutrition: Appears to be somewhat compromised by his recurrent admissions with abdominal pain-reiterated low potassium/low phosphorus diet. 6. Hypertension: Blood pressure currently acceptable.  Subjective:   Reports resolution of abdominal pain and inquires about being discharged.   Objective:   BP 114/75 (BP Location: Right Arm)   Pulse 76   Temp 97.8 F (36.6 C) (Oral)   Resp 14   Ht _0  (1.88 m)   Wt 76 kg   SpO2 95%   BMI 21.51 kg/m   Physical Exam: Gen: Appears to be comfortable, sitting on the edge of his bed. CVS: Pulse regular rhythm, normal rate, 2/6 HSM in outflow tract Resp: Clear to auscultation, no rales/rhonchi Abd: Soft, flat, mild tenderness over upper quadrants without guarding Ext: Without lower extremity edema.  Left forearm AVF with thrill  Labs: BMET Recent Labs  Lab 06/22/18 0630 06/23/18 0546  NA 138 136  K 4.6 7.4*  CL 96* 97*  CO2 27 21*  GLUCOSE 84 57*  BUN 64* 74*  CREATININE 10.81* 12.24*  CALCIUM 9.2 8.3*   CBC Recent Labs  Lab 06/22/18 0630 06/23/18 0546  WBC 4.6 4.4  HGB 9.4* 8.6*  HCT 32.2* 28.5*  MCV 85.9 83.6  PLT 237 193   Medications:    . amLODipine  5 mg Oral Daily  . apixaban  5 mg Oral BID  . atropine  0.4 mg Intravenous Once  . busPIRone  5 mg Oral BID  .  Chlorhexidine Gluconate Cloth  6 each Topical Q0600  . cinacalcet  90 mg Oral QPM  . gabapentin  300 mg Oral QPM  . hydrALAZINE  100 mg Oral TID  . lanthanum  1,000 mg Oral TID WC  . lipase/protease/amylase  12,000 Units Oral TID AC  . multivitamin  1 tablet Oral QHS  . pantoprazole  40 mg Oral Daily   Elmarie Shiley, MD 06/24/2018, 8:53 AM

## 2018-06-25 IMAGING — CR DG CHEST 2V
2 series · 2 of 2 positions shown · non-contrast
Comparison: 01/30/2016

CLINICAL DATA: Chest pain.

EXAM:
CHEST  2 VIEW

[w chest lat]
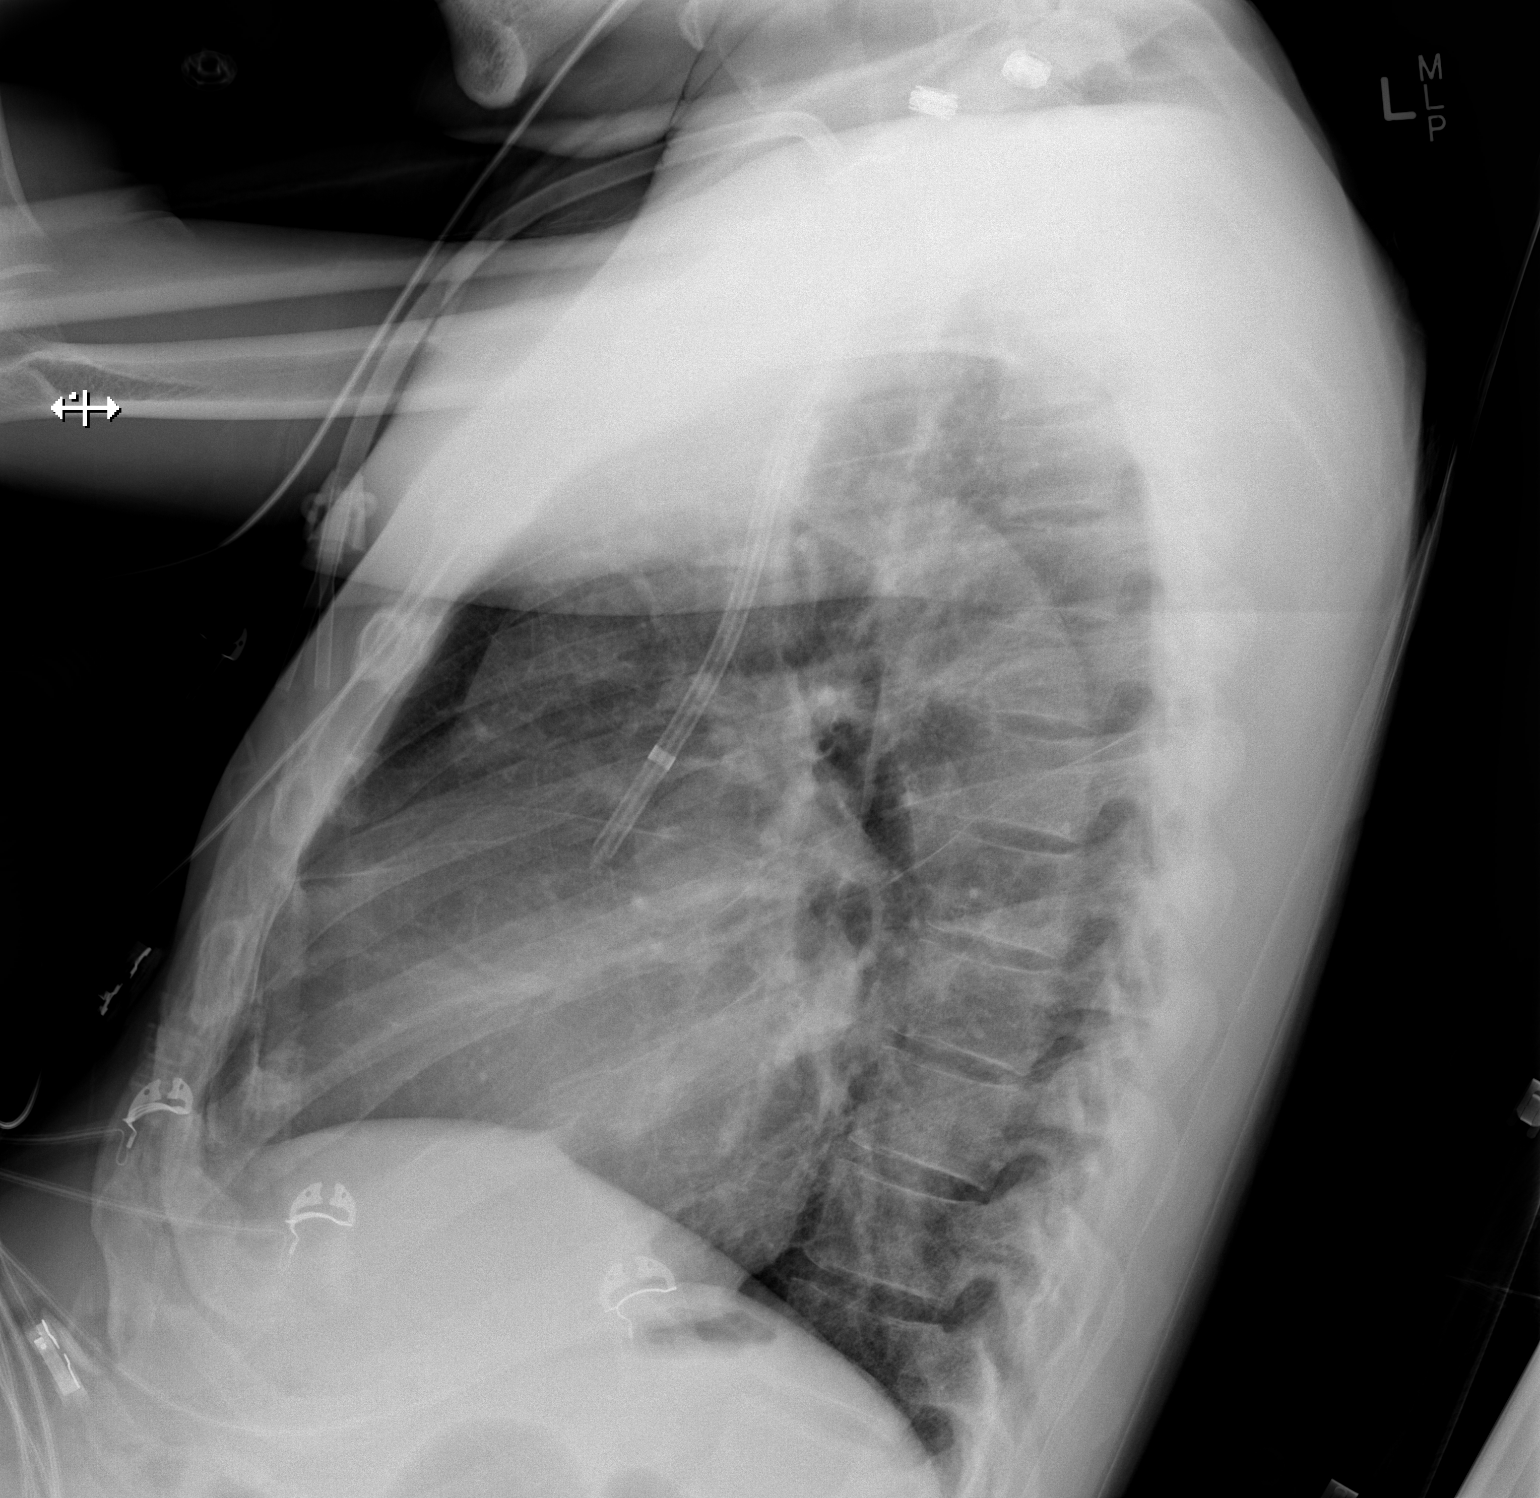

[x chest ap]
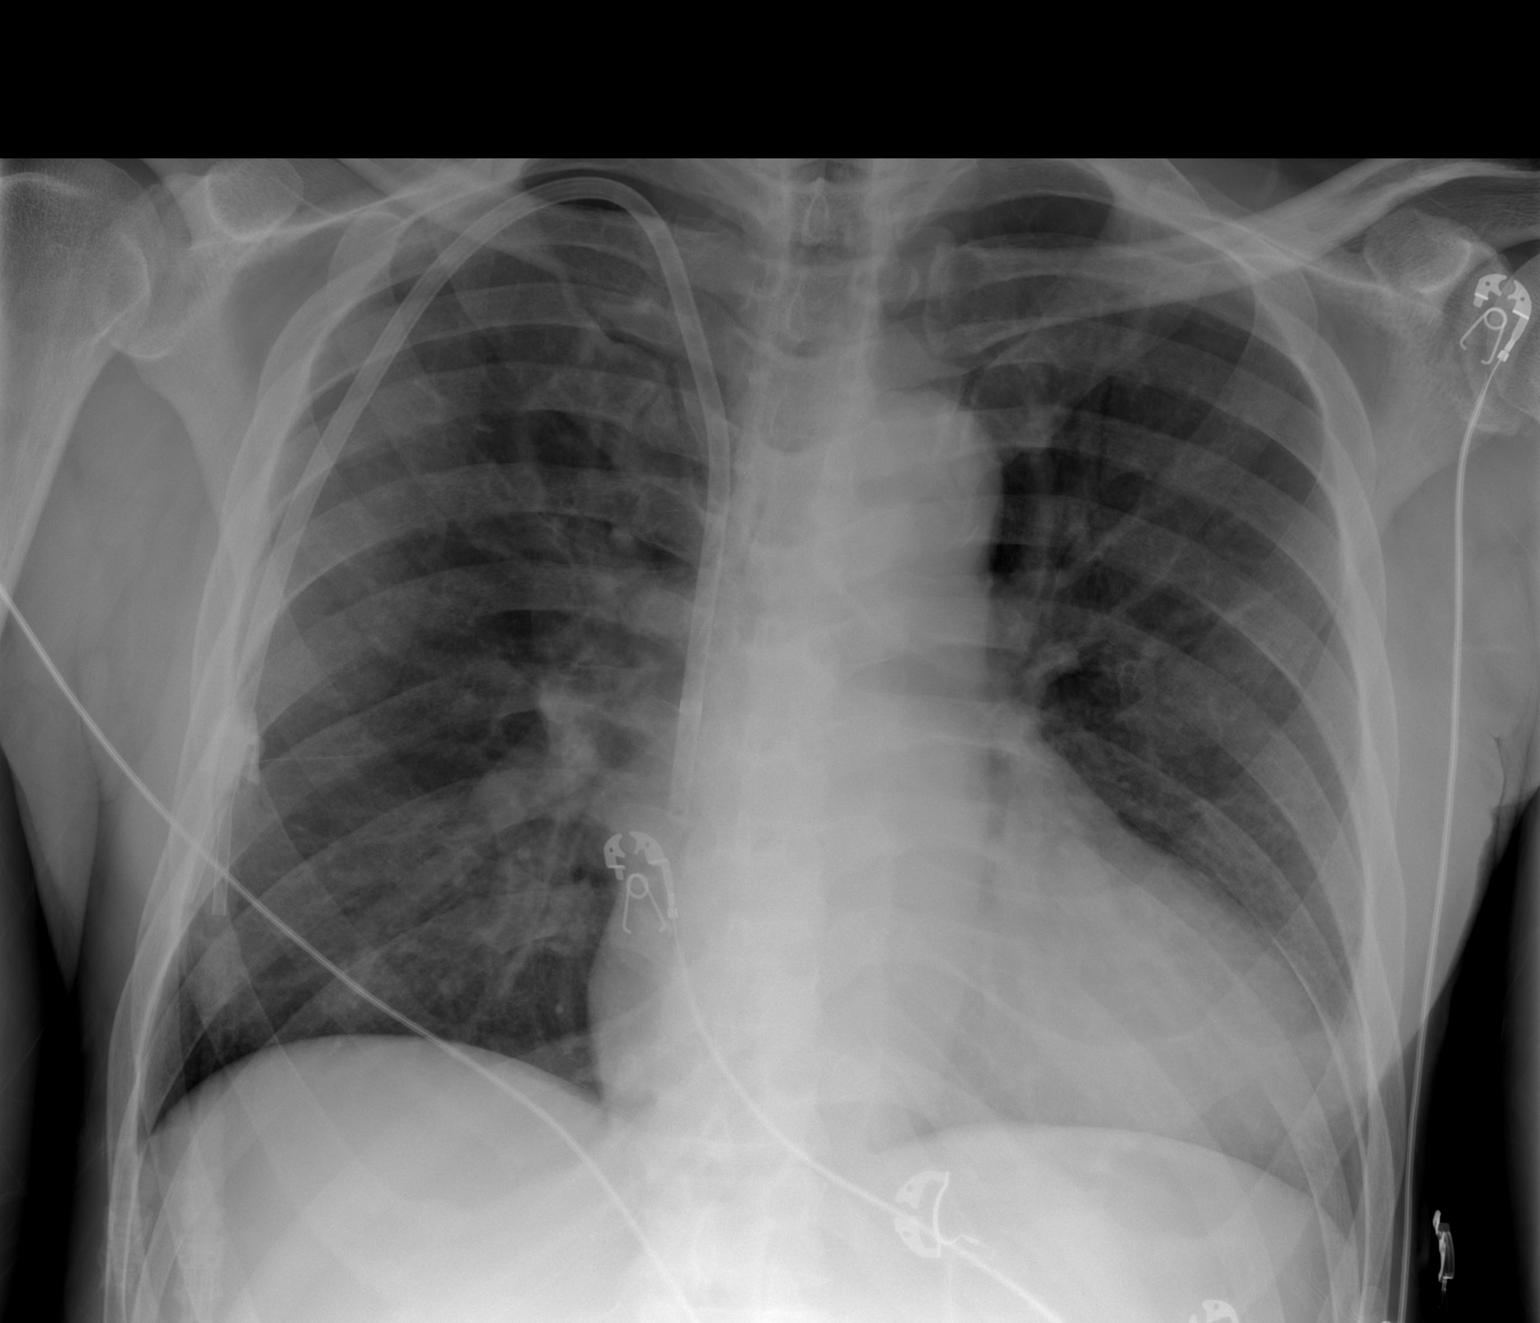

[2 of 2 positions shown; findings below may reference images not displayed]

FINDINGS: Chronic cardiopericardial enlargement. Stable aortic and hilar
contours. Dialysis catheter on the right, tip at the lower SVC.

There is no edema, consolidation, effusion, or pneumothorax.
Sclerotic bones, likely renal osteodystrophy.
IMPRESSION: Stable.  No evidence of acute disease.

## 2018-06-28 DIAGNOSIS — E875 Hyperkalemia: Secondary | ICD-10-CM | POA: Diagnosis not present

## 2018-06-28 DIAGNOSIS — N2889 Other specified disorders of kidney and ureter: Secondary | ICD-10-CM | POA: Diagnosis not present

## 2018-06-28 DIAGNOSIS — Z992 Dependence on renal dialysis: Secondary | ICD-10-CM | POA: Diagnosis not present

## 2018-06-28 DIAGNOSIS — I5189 Other ill-defined heart diseases: Secondary | ICD-10-CM | POA: Diagnosis not present

## 2018-06-28 DIAGNOSIS — K219 Gastro-esophageal reflux disease without esophagitis: Secondary | ICD-10-CM | POA: Diagnosis not present

## 2018-06-28 DIAGNOSIS — R531 Weakness: Secondary | ICD-10-CM | POA: Diagnosis not present

## 2018-06-28 DIAGNOSIS — J439 Emphysema, unspecified: Secondary | ICD-10-CM | POA: Diagnosis not present

## 2018-06-28 DIAGNOSIS — Z79899 Other long term (current) drug therapy: Secondary | ICD-10-CM | POA: Diagnosis not present

## 2018-06-28 DIAGNOSIS — R591 Generalized enlarged lymph nodes: Secondary | ICD-10-CM | POA: Diagnosis present

## 2018-06-28 DIAGNOSIS — R0602 Shortness of breath: Secondary | ICD-10-CM | POA: Diagnosis not present

## 2018-06-28 DIAGNOSIS — Z794 Long term (current) use of insulin: Secondary | ICD-10-CM | POA: Diagnosis not present

## 2018-06-28 DIAGNOSIS — J984 Other disorders of lung: Secondary | ICD-10-CM | POA: Diagnosis not present

## 2018-06-28 DIAGNOSIS — R160 Hepatomegaly, not elsewhere classified: Secondary | ICD-10-CM | POA: Diagnosis not present

## 2018-06-28 DIAGNOSIS — M5440 Lumbago with sciatica, unspecified side: Secondary | ICD-10-CM | POA: Diagnosis not present

## 2018-06-28 DIAGNOSIS — J948 Other specified pleural conditions: Secondary | ICD-10-CM | POA: Diagnosis not present

## 2018-06-28 DIAGNOSIS — J811 Chronic pulmonary edema: Secondary | ICD-10-CM | POA: Diagnosis not present

## 2018-06-28 DIAGNOSIS — Z8719 Personal history of other diseases of the digestive system: Secondary | ICD-10-CM | POA: Diagnosis not present

## 2018-06-28 DIAGNOSIS — J95811 Postprocedural pneumothorax: Secondary | ICD-10-CM | POA: Diagnosis not present

## 2018-06-28 DIAGNOSIS — I059 Rheumatic mitral valve disease, unspecified: Secondary | ICD-10-CM | POA: Diagnosis not present

## 2018-06-28 DIAGNOSIS — J8410 Pulmonary fibrosis, unspecified: Secondary | ICD-10-CM | POA: Diagnosis not present

## 2018-06-28 DIAGNOSIS — J929 Pleural plaque without asbestos: Secondary | ICD-10-CM | POA: Diagnosis not present

## 2018-06-28 DIAGNOSIS — I313 Pericardial effusion (noninflammatory): Secondary | ICD-10-CM | POA: Diagnosis not present

## 2018-06-28 DIAGNOSIS — J869 Pyothorax without fistula: Secondary | ICD-10-CM | POA: Diagnosis not present

## 2018-06-28 DIAGNOSIS — I110 Hypertensive heart disease with heart failure: Secondary | ICD-10-CM | POA: Diagnosis not present

## 2018-06-28 DIAGNOSIS — G8929 Other chronic pain: Secondary | ICD-10-CM | POA: Diagnosis not present

## 2018-06-28 DIAGNOSIS — E1122 Type 2 diabetes mellitus with diabetic chronic kidney disease: Secondary | ICD-10-CM | POA: Diagnosis present

## 2018-06-28 DIAGNOSIS — I132 Hypertensive heart and chronic kidney disease with heart failure and with stage 5 chronic kidney disease, or end stage renal disease: Secondary | ICD-10-CM | POA: Diagnosis not present

## 2018-06-28 DIAGNOSIS — R1084 Generalized abdominal pain: Secondary | ICD-10-CM | POA: Diagnosis not present

## 2018-06-28 DIAGNOSIS — D631 Anemia in chronic kidney disease: Secondary | ICD-10-CM | POA: Diagnosis present

## 2018-06-28 DIAGNOSIS — R590 Localized enlarged lymph nodes: Secondary | ICD-10-CM | POA: Diagnosis not present

## 2018-06-28 DIAGNOSIS — E11649 Type 2 diabetes mellitus with hypoglycemia without coma: Secondary | ICD-10-CM | POA: Diagnosis not present

## 2018-06-28 DIAGNOSIS — G4733 Obstructive sleep apnea (adult) (pediatric): Secondary | ICD-10-CM | POA: Diagnosis not present

## 2018-06-28 DIAGNOSIS — M545 Low back pain: Secondary | ICD-10-CM | POA: Diagnosis not present

## 2018-06-28 DIAGNOSIS — Z4682 Encounter for fitting and adjustment of non-vascular catheter: Secondary | ICD-10-CM | POA: Diagnosis not present

## 2018-06-28 DIAGNOSIS — J9811 Atelectasis: Secondary | ICD-10-CM | POA: Diagnosis not present

## 2018-06-28 DIAGNOSIS — Z9889 Other specified postprocedural states: Secondary | ICD-10-CM | POA: Diagnosis not present

## 2018-06-28 DIAGNOSIS — N19 Unspecified kidney failure: Secondary | ICD-10-CM | POA: Diagnosis not present

## 2018-06-28 DIAGNOSIS — R1904 Left lower quadrant abdominal swelling, mass and lump: Secondary | ICD-10-CM | POA: Diagnosis not present

## 2018-06-28 DIAGNOSIS — Z86711 Personal history of pulmonary embolism: Secondary | ICD-10-CM | POA: Diagnosis not present

## 2018-06-28 DIAGNOSIS — J90 Pleural effusion, not elsewhere classified: Secondary | ICD-10-CM | POA: Diagnosis not present

## 2018-06-28 DIAGNOSIS — I509 Heart failure, unspecified: Secondary | ICD-10-CM | POA: Diagnosis not present

## 2018-06-28 DIAGNOSIS — K861 Other chronic pancreatitis: Secondary | ICD-10-CM | POA: Diagnosis present

## 2018-06-28 DIAGNOSIS — E119 Type 2 diabetes mellitus without complications: Secondary | ICD-10-CM | POA: Diagnosis not present

## 2018-06-28 DIAGNOSIS — T797XXA Traumatic subcutaneous emphysema, initial encounter: Secondary | ICD-10-CM | POA: Diagnosis not present

## 2018-06-28 DIAGNOSIS — I4891 Unspecified atrial fibrillation: Secondary | ICD-10-CM | POA: Diagnosis not present

## 2018-06-28 DIAGNOSIS — J939 Pneumothorax, unspecified: Secondary | ICD-10-CM | POA: Diagnosis not present

## 2018-06-28 DIAGNOSIS — I340 Nonrheumatic mitral (valve) insufficiency: Secondary | ICD-10-CM | POA: Diagnosis not present

## 2018-06-28 DIAGNOSIS — J189 Pneumonia, unspecified organism: Secondary | ICD-10-CM | POA: Diagnosis not present

## 2018-06-28 DIAGNOSIS — R918 Other nonspecific abnormal finding of lung field: Secondary | ICD-10-CM | POA: Diagnosis not present

## 2018-06-28 DIAGNOSIS — Z888 Allergy status to other drugs, medicaments and biological substances status: Secondary | ICD-10-CM | POA: Diagnosis not present

## 2018-06-28 DIAGNOSIS — T8612 Kidney transplant failure: Secondary | ICD-10-CM | POA: Diagnosis not present

## 2018-06-28 DIAGNOSIS — Z9115 Patient's noncompliance with renal dialysis: Secondary | ICD-10-CM | POA: Diagnosis not present

## 2018-06-28 DIAGNOSIS — J9819 Other pulmonary collapse: Secondary | ICD-10-CM | POA: Diagnosis not present

## 2018-06-28 DIAGNOSIS — N186 End stage renal disease: Secondary | ICD-10-CM | POA: Diagnosis not present

## 2018-06-28 DIAGNOSIS — I517 Cardiomegaly: Secondary | ICD-10-CM | POA: Diagnosis not present

## 2018-06-28 DIAGNOSIS — I5022 Chronic systolic (congestive) heart failure: Secondary | ICD-10-CM | POA: Diagnosis not present

## 2018-06-28 DIAGNOSIS — R000 Tachycardia, unspecified: Secondary | ICD-10-CM | POA: Diagnosis not present

## 2018-06-28 DIAGNOSIS — Z9119 Patient's noncompliance with other medical treatment and regimen: Secondary | ICD-10-CM | POA: Diagnosis not present

## 2018-06-29 DIAGNOSIS — I517 Cardiomegaly: Secondary | ICD-10-CM | POA: Diagnosis not present

## 2018-06-29 DIAGNOSIS — J9819 Other pulmonary collapse: Secondary | ICD-10-CM | POA: Diagnosis not present

## 2018-06-29 DIAGNOSIS — E875 Hyperkalemia: Secondary | ICD-10-CM | POA: Diagnosis not present

## 2018-06-29 DIAGNOSIS — I059 Rheumatic mitral valve disease, unspecified: Secondary | ICD-10-CM | POA: Diagnosis not present

## 2018-06-29 DIAGNOSIS — J948 Other specified pleural conditions: Secondary | ICD-10-CM | POA: Diagnosis not present

## 2018-06-29 DIAGNOSIS — R531 Weakness: Secondary | ICD-10-CM | POA: Diagnosis not present

## 2018-06-29 DIAGNOSIS — J439 Emphysema, unspecified: Secondary | ICD-10-CM | POA: Diagnosis not present

## 2018-06-29 DIAGNOSIS — N186 End stage renal disease: Secondary | ICD-10-CM | POA: Diagnosis not present

## 2018-06-29 DIAGNOSIS — K861 Other chronic pancreatitis: Secondary | ICD-10-CM | POA: Diagnosis not present

## 2018-06-29 DIAGNOSIS — I340 Nonrheumatic mitral (valve) insufficiency: Secondary | ICD-10-CM | POA: Diagnosis not present

## 2018-06-29 DIAGNOSIS — J189 Pneumonia, unspecified organism: Secondary | ICD-10-CM | POA: Diagnosis not present

## 2018-06-29 DIAGNOSIS — J90 Pleural effusion, not elsewhere classified: Secondary | ICD-10-CM | POA: Diagnosis not present

## 2018-06-29 DIAGNOSIS — I5189 Other ill-defined heart diseases: Secondary | ICD-10-CM | POA: Diagnosis not present

## 2018-06-29 DIAGNOSIS — Z9119 Patient's noncompliance with other medical treatment and regimen: Secondary | ICD-10-CM | POA: Diagnosis not present

## 2018-06-29 DIAGNOSIS — J869 Pyothorax without fistula: Secondary | ICD-10-CM | POA: Diagnosis not present

## 2018-06-29 DIAGNOSIS — I313 Pericardial effusion (noninflammatory): Secondary | ICD-10-CM | POA: Diagnosis not present

## 2018-06-29 DIAGNOSIS — N19 Unspecified kidney failure: Secondary | ICD-10-CM | POA: Diagnosis not present

## 2018-06-29 DIAGNOSIS — Z992 Dependence on renal dialysis: Secondary | ICD-10-CM | POA: Diagnosis not present

## 2018-06-30 DIAGNOSIS — I313 Pericardial effusion (noninflammatory): Secondary | ICD-10-CM | POA: Diagnosis not present

## 2018-06-30 DIAGNOSIS — J9819 Other pulmonary collapse: Secondary | ICD-10-CM | POA: Diagnosis not present

## 2018-06-30 DIAGNOSIS — J948 Other specified pleural conditions: Secondary | ICD-10-CM | POA: Diagnosis not present

## 2018-06-30 DIAGNOSIS — J189 Pneumonia, unspecified organism: Secondary | ICD-10-CM | POA: Diagnosis not present

## 2018-06-30 DIAGNOSIS — J869 Pyothorax without fistula: Secondary | ICD-10-CM | POA: Diagnosis not present

## 2018-06-30 DIAGNOSIS — K861 Other chronic pancreatitis: Secondary | ICD-10-CM | POA: Diagnosis not present

## 2018-06-30 DIAGNOSIS — Z992 Dependence on renal dialysis: Secondary | ICD-10-CM | POA: Diagnosis not present

## 2018-06-30 DIAGNOSIS — R531 Weakness: Secondary | ICD-10-CM | POA: Diagnosis not present

## 2018-06-30 DIAGNOSIS — N186 End stage renal disease: Secondary | ICD-10-CM | POA: Diagnosis not present

## 2018-07-01 DIAGNOSIS — J189 Pneumonia, unspecified organism: Secondary | ICD-10-CM | POA: Diagnosis not present

## 2018-07-01 DIAGNOSIS — E875 Hyperkalemia: Secondary | ICD-10-CM | POA: Diagnosis not present

## 2018-07-01 DIAGNOSIS — J869 Pyothorax without fistula: Secondary | ICD-10-CM | POA: Diagnosis not present

## 2018-07-01 DIAGNOSIS — J948 Other specified pleural conditions: Secondary | ICD-10-CM | POA: Diagnosis not present

## 2018-07-01 DIAGNOSIS — Z9119 Patient's noncompliance with other medical treatment and regimen: Secondary | ICD-10-CM | POA: Diagnosis not present

## 2018-07-01 DIAGNOSIS — J90 Pleural effusion, not elsewhere classified: Secondary | ICD-10-CM | POA: Diagnosis not present

## 2018-07-01 DIAGNOSIS — R531 Weakness: Secondary | ICD-10-CM | POA: Diagnosis not present

## 2018-07-01 DIAGNOSIS — I313 Pericardial effusion (noninflammatory): Secondary | ICD-10-CM | POA: Diagnosis not present

## 2018-07-01 DIAGNOSIS — K861 Other chronic pancreatitis: Secondary | ICD-10-CM | POA: Diagnosis not present

## 2018-07-01 DIAGNOSIS — N186 End stage renal disease: Secondary | ICD-10-CM | POA: Diagnosis not present

## 2018-07-01 DIAGNOSIS — J9819 Other pulmonary collapse: Secondary | ICD-10-CM | POA: Diagnosis not present

## 2018-07-01 DIAGNOSIS — N19 Unspecified kidney failure: Secondary | ICD-10-CM | POA: Diagnosis not present

## 2018-07-02 DIAGNOSIS — R0602 Shortness of breath: Secondary | ICD-10-CM | POA: Diagnosis not present

## 2018-07-02 DIAGNOSIS — J939 Pneumothorax, unspecified: Secondary | ICD-10-CM | POA: Diagnosis not present

## 2018-07-02 DIAGNOSIS — I313 Pericardial effusion (noninflammatory): Secondary | ICD-10-CM | POA: Diagnosis not present

## 2018-07-02 DIAGNOSIS — I517 Cardiomegaly: Secondary | ICD-10-CM | POA: Diagnosis not present

## 2018-07-02 DIAGNOSIS — K861 Other chronic pancreatitis: Secondary | ICD-10-CM | POA: Diagnosis not present

## 2018-07-02 DIAGNOSIS — J8410 Pulmonary fibrosis, unspecified: Secondary | ICD-10-CM | POA: Diagnosis not present

## 2018-07-02 DIAGNOSIS — J869 Pyothorax without fistula: Secondary | ICD-10-CM | POA: Diagnosis not present

## 2018-07-02 DIAGNOSIS — Z4682 Encounter for fitting and adjustment of non-vascular catheter: Secondary | ICD-10-CM | POA: Diagnosis not present

## 2018-07-02 DIAGNOSIS — J948 Other specified pleural conditions: Secondary | ICD-10-CM | POA: Diagnosis not present

## 2018-07-02 DIAGNOSIS — N186 End stage renal disease: Secondary | ICD-10-CM | POA: Diagnosis not present

## 2018-07-02 DIAGNOSIS — Z9889 Other specified postprocedural states: Secondary | ICD-10-CM | POA: Diagnosis not present

## 2018-07-02 DIAGNOSIS — R918 Other nonspecific abnormal finding of lung field: Secondary | ICD-10-CM | POA: Diagnosis not present

## 2018-07-02 DIAGNOSIS — J189 Pneumonia, unspecified organism: Secondary | ICD-10-CM | POA: Diagnosis not present

## 2018-07-03 DIAGNOSIS — J948 Other specified pleural conditions: Secondary | ICD-10-CM | POA: Diagnosis not present

## 2018-07-03 DIAGNOSIS — N186 End stage renal disease: Secondary | ICD-10-CM | POA: Diagnosis not present

## 2018-07-03 DIAGNOSIS — J869 Pyothorax without fistula: Secondary | ICD-10-CM | POA: Diagnosis not present

## 2018-07-03 DIAGNOSIS — K861 Other chronic pancreatitis: Secondary | ICD-10-CM | POA: Diagnosis not present

## 2018-07-03 DIAGNOSIS — J189 Pneumonia, unspecified organism: Secondary | ICD-10-CM | POA: Diagnosis not present

## 2018-07-03 DIAGNOSIS — Z992 Dependence on renal dialysis: Secondary | ICD-10-CM | POA: Diagnosis not present

## 2018-07-03 DIAGNOSIS — Z4682 Encounter for fitting and adjustment of non-vascular catheter: Secondary | ICD-10-CM | POA: Diagnosis not present

## 2018-07-03 DIAGNOSIS — J9819 Other pulmonary collapse: Secondary | ICD-10-CM | POA: Diagnosis not present

## 2018-07-03 DIAGNOSIS — R918 Other nonspecific abnormal finding of lung field: Secondary | ICD-10-CM | POA: Diagnosis not present

## 2018-07-03 DIAGNOSIS — J95811 Postprocedural pneumothorax: Secondary | ICD-10-CM | POA: Diagnosis not present

## 2018-07-03 DIAGNOSIS — Z9889 Other specified postprocedural states: Secondary | ICD-10-CM | POA: Diagnosis not present

## 2018-07-03 DIAGNOSIS — I313 Pericardial effusion (noninflammatory): Secondary | ICD-10-CM | POA: Diagnosis not present

## 2018-07-03 DIAGNOSIS — R531 Weakness: Secondary | ICD-10-CM | POA: Diagnosis not present

## 2018-07-04 DIAGNOSIS — J90 Pleural effusion, not elsewhere classified: Secondary | ICD-10-CM | POA: Diagnosis not present

## 2018-07-04 DIAGNOSIS — I313 Pericardial effusion (noninflammatory): Secondary | ICD-10-CM | POA: Diagnosis not present

## 2018-07-04 DIAGNOSIS — J948 Other specified pleural conditions: Secondary | ICD-10-CM | POA: Diagnosis not present

## 2018-07-04 DIAGNOSIS — J811 Chronic pulmonary edema: Secondary | ICD-10-CM | POA: Diagnosis not present

## 2018-07-04 DIAGNOSIS — N186 End stage renal disease: Secondary | ICD-10-CM | POA: Diagnosis not present

## 2018-07-04 DIAGNOSIS — J9819 Other pulmonary collapse: Secondary | ICD-10-CM | POA: Diagnosis not present

## 2018-07-04 DIAGNOSIS — R531 Weakness: Secondary | ICD-10-CM | POA: Diagnosis not present

## 2018-07-04 DIAGNOSIS — K861 Other chronic pancreatitis: Secondary | ICD-10-CM | POA: Diagnosis not present

## 2018-07-04 DIAGNOSIS — J869 Pyothorax without fistula: Secondary | ICD-10-CM | POA: Diagnosis not present

## 2018-07-04 DIAGNOSIS — J189 Pneumonia, unspecified organism: Secondary | ICD-10-CM | POA: Diagnosis not present

## 2018-07-05 DIAGNOSIS — R531 Weakness: Secondary | ICD-10-CM | POA: Diagnosis not present

## 2018-07-05 DIAGNOSIS — N186 End stage renal disease: Secondary | ICD-10-CM | POA: Diagnosis not present

## 2018-07-05 DIAGNOSIS — R918 Other nonspecific abnormal finding of lung field: Secondary | ICD-10-CM | POA: Diagnosis not present

## 2018-07-05 DIAGNOSIS — J948 Other specified pleural conditions: Secondary | ICD-10-CM | POA: Diagnosis not present

## 2018-07-05 DIAGNOSIS — J9811 Atelectasis: Secondary | ICD-10-CM | POA: Diagnosis not present

## 2018-07-05 DIAGNOSIS — J869 Pyothorax without fistula: Secondary | ICD-10-CM | POA: Diagnosis not present

## 2018-07-05 DIAGNOSIS — J9819 Other pulmonary collapse: Secondary | ICD-10-CM | POA: Diagnosis not present

## 2018-07-05 DIAGNOSIS — I313 Pericardial effusion (noninflammatory): Secondary | ICD-10-CM | POA: Diagnosis not present

## 2018-07-05 DIAGNOSIS — K861 Other chronic pancreatitis: Secondary | ICD-10-CM | POA: Diagnosis not present

## 2018-07-05 DIAGNOSIS — J189 Pneumonia, unspecified organism: Secondary | ICD-10-CM | POA: Diagnosis not present

## 2018-07-06 DIAGNOSIS — J869 Pyothorax without fistula: Secondary | ICD-10-CM | POA: Diagnosis not present

## 2018-07-06 DIAGNOSIS — J189 Pneumonia, unspecified organism: Secondary | ICD-10-CM | POA: Diagnosis not present

## 2018-07-06 DIAGNOSIS — J939 Pneumothorax, unspecified: Secondary | ICD-10-CM | POA: Diagnosis not present

## 2018-07-06 DIAGNOSIS — N186 End stage renal disease: Secondary | ICD-10-CM | POA: Diagnosis not present

## 2018-07-06 DIAGNOSIS — E875 Hyperkalemia: Secondary | ICD-10-CM | POA: Diagnosis not present

## 2018-07-06 DIAGNOSIS — J90 Pleural effusion, not elsewhere classified: Secondary | ICD-10-CM | POA: Diagnosis not present

## 2018-07-06 DIAGNOSIS — K861 Other chronic pancreatitis: Secondary | ICD-10-CM | POA: Diagnosis not present

## 2018-07-06 DIAGNOSIS — Z9119 Patient's noncompliance with other medical treatment and regimen: Secondary | ICD-10-CM | POA: Diagnosis not present

## 2018-07-06 DIAGNOSIS — Z4682 Encounter for fitting and adjustment of non-vascular catheter: Secondary | ICD-10-CM | POA: Diagnosis not present

## 2018-07-06 DIAGNOSIS — J948 Other specified pleural conditions: Secondary | ICD-10-CM | POA: Diagnosis not present

## 2018-07-06 DIAGNOSIS — I517 Cardiomegaly: Secondary | ICD-10-CM | POA: Diagnosis not present

## 2018-07-06 DIAGNOSIS — I313 Pericardial effusion (noninflammatory): Secondary | ICD-10-CM | POA: Diagnosis not present

## 2018-07-07 DIAGNOSIS — J189 Pneumonia, unspecified organism: Secondary | ICD-10-CM | POA: Diagnosis not present

## 2018-07-07 DIAGNOSIS — J869 Pyothorax without fistula: Secondary | ICD-10-CM | POA: Diagnosis not present

## 2018-07-07 DIAGNOSIS — J948 Other specified pleural conditions: Secondary | ICD-10-CM | POA: Diagnosis not present

## 2018-07-07 DIAGNOSIS — K861 Other chronic pancreatitis: Secondary | ICD-10-CM | POA: Diagnosis not present

## 2018-07-07 DIAGNOSIS — N186 End stage renal disease: Secondary | ICD-10-CM | POA: Diagnosis not present

## 2018-07-07 DIAGNOSIS — J939 Pneumothorax, unspecified: Secondary | ICD-10-CM | POA: Diagnosis not present

## 2018-07-07 DIAGNOSIS — R918 Other nonspecific abnormal finding of lung field: Secondary | ICD-10-CM | POA: Diagnosis not present

## 2018-07-07 DIAGNOSIS — Z4682 Encounter for fitting and adjustment of non-vascular catheter: Secondary | ICD-10-CM | POA: Diagnosis not present

## 2018-07-07 DIAGNOSIS — T797XXA Traumatic subcutaneous emphysema, initial encounter: Secondary | ICD-10-CM | POA: Diagnosis not present

## 2018-07-07 DIAGNOSIS — I313 Pericardial effusion (noninflammatory): Secondary | ICD-10-CM | POA: Diagnosis not present

## 2018-07-07 DIAGNOSIS — J90 Pleural effusion, not elsewhere classified: Secondary | ICD-10-CM | POA: Diagnosis not present

## 2018-07-08 DIAGNOSIS — K861 Other chronic pancreatitis: Secondary | ICD-10-CM | POA: Diagnosis not present

## 2018-07-08 DIAGNOSIS — E875 Hyperkalemia: Secondary | ICD-10-CM | POA: Diagnosis not present

## 2018-07-08 DIAGNOSIS — N186 End stage renal disease: Secondary | ICD-10-CM | POA: Diagnosis not present

## 2018-07-08 DIAGNOSIS — J90 Pleural effusion, not elsewhere classified: Secondary | ICD-10-CM | POA: Diagnosis not present

## 2018-07-08 DIAGNOSIS — Z9119 Patient's noncompliance with other medical treatment and regimen: Secondary | ICD-10-CM | POA: Diagnosis not present

## 2018-07-08 DIAGNOSIS — R918 Other nonspecific abnormal finding of lung field: Secondary | ICD-10-CM | POA: Diagnosis not present

## 2018-07-08 DIAGNOSIS — J939 Pneumothorax, unspecified: Secondary | ICD-10-CM | POA: Diagnosis not present

## 2018-07-08 DIAGNOSIS — J9811 Atelectasis: Secondary | ICD-10-CM | POA: Diagnosis not present

## 2018-07-08 DIAGNOSIS — J189 Pneumonia, unspecified organism: Secondary | ICD-10-CM | POA: Diagnosis not present

## 2018-07-08 DIAGNOSIS — J948 Other specified pleural conditions: Secondary | ICD-10-CM | POA: Diagnosis not present

## 2018-07-08 DIAGNOSIS — J869 Pyothorax without fistula: Secondary | ICD-10-CM | POA: Diagnosis not present

## 2018-07-08 DIAGNOSIS — T797XXA Traumatic subcutaneous emphysema, initial encounter: Secondary | ICD-10-CM | POA: Diagnosis not present

## 2018-07-08 DIAGNOSIS — I313 Pericardial effusion (noninflammatory): Secondary | ICD-10-CM | POA: Diagnosis not present

## 2018-07-09 DIAGNOSIS — N186 End stage renal disease: Secondary | ICD-10-CM | POA: Diagnosis not present

## 2018-07-09 DIAGNOSIS — I313 Pericardial effusion (noninflammatory): Secondary | ICD-10-CM | POA: Diagnosis not present

## 2018-07-09 DIAGNOSIS — R918 Other nonspecific abnormal finding of lung field: Secondary | ICD-10-CM | POA: Diagnosis not present

## 2018-07-09 DIAGNOSIS — J939 Pneumothorax, unspecified: Secondary | ICD-10-CM | POA: Diagnosis not present

## 2018-07-09 DIAGNOSIS — J189 Pneumonia, unspecified organism: Secondary | ICD-10-CM | POA: Diagnosis not present

## 2018-07-09 DIAGNOSIS — K861 Other chronic pancreatitis: Secondary | ICD-10-CM | POA: Diagnosis not present

## 2018-07-09 DIAGNOSIS — J984 Other disorders of lung: Secondary | ICD-10-CM | POA: Diagnosis not present

## 2018-07-09 DIAGNOSIS — J90 Pleural effusion, not elsewhere classified: Secondary | ICD-10-CM | POA: Diagnosis not present

## 2018-07-09 DIAGNOSIS — J869 Pyothorax without fistula: Secondary | ICD-10-CM | POA: Diagnosis not present

## 2018-07-09 DIAGNOSIS — J948 Other specified pleural conditions: Secondary | ICD-10-CM | POA: Diagnosis not present

## 2018-07-10 DIAGNOSIS — E875 Hyperkalemia: Secondary | ICD-10-CM | POA: Diagnosis not present

## 2018-07-10 DIAGNOSIS — Z79899 Other long term (current) drug therapy: Secondary | ICD-10-CM | POA: Diagnosis not present

## 2018-07-10 DIAGNOSIS — J948 Other specified pleural conditions: Secondary | ICD-10-CM | POA: Diagnosis not present

## 2018-07-10 DIAGNOSIS — M5440 Lumbago with sciatica, unspecified side: Secondary | ICD-10-CM | POA: Diagnosis not present

## 2018-07-10 DIAGNOSIS — J189 Pneumonia, unspecified organism: Secondary | ICD-10-CM | POA: Diagnosis not present

## 2018-07-10 DIAGNOSIS — Z9119 Patient's noncompliance with other medical treatment and regimen: Secondary | ICD-10-CM | POA: Diagnosis not present

## 2018-07-10 DIAGNOSIS — R531 Weakness: Secondary | ICD-10-CM | POA: Diagnosis not present

## 2018-07-10 DIAGNOSIS — J869 Pyothorax without fistula: Secondary | ICD-10-CM | POA: Diagnosis not present

## 2018-07-10 DIAGNOSIS — N19 Unspecified kidney failure: Secondary | ICD-10-CM | POA: Diagnosis not present

## 2018-07-10 DIAGNOSIS — J939 Pneumothorax, unspecified: Secondary | ICD-10-CM | POA: Diagnosis not present

## 2018-07-10 DIAGNOSIS — I313 Pericardial effusion (noninflammatory): Secondary | ICD-10-CM | POA: Diagnosis not present

## 2018-07-10 DIAGNOSIS — M545 Low back pain: Secondary | ICD-10-CM | POA: Diagnosis not present

## 2018-07-10 DIAGNOSIS — R918 Other nonspecific abnormal finding of lung field: Secondary | ICD-10-CM | POA: Diagnosis not present

## 2018-07-10 DIAGNOSIS — N186 End stage renal disease: Secondary | ICD-10-CM | POA: Diagnosis not present

## 2018-07-10 DIAGNOSIS — J90 Pleural effusion, not elsewhere classified: Secondary | ICD-10-CM | POA: Diagnosis not present

## 2018-07-10 DIAGNOSIS — J811 Chronic pulmonary edema: Secondary | ICD-10-CM | POA: Diagnosis not present

## 2018-07-10 DIAGNOSIS — K861 Other chronic pancreatitis: Secondary | ICD-10-CM | POA: Diagnosis not present

## 2018-07-10 DIAGNOSIS — G8929 Other chronic pain: Secondary | ICD-10-CM | POA: Diagnosis not present

## 2018-07-11 DIAGNOSIS — N2581 Secondary hyperparathyroidism of renal origin: Secondary | ICD-10-CM | POA: Diagnosis not present

## 2018-07-11 DIAGNOSIS — Z992 Dependence on renal dialysis: Secondary | ICD-10-CM | POA: Diagnosis not present

## 2018-07-11 DIAGNOSIS — D509 Iron deficiency anemia, unspecified: Secondary | ICD-10-CM | POA: Diagnosis not present

## 2018-07-11 DIAGNOSIS — N186 End stage renal disease: Secondary | ICD-10-CM | POA: Diagnosis not present

## 2018-07-11 DIAGNOSIS — D631 Anemia in chronic kidney disease: Secondary | ICD-10-CM | POA: Diagnosis not present

## 2018-07-11 DIAGNOSIS — E875 Hyperkalemia: Secondary | ICD-10-CM | POA: Diagnosis not present

## 2018-07-12 DIAGNOSIS — R0600 Dyspnea, unspecified: Secondary | ICD-10-CM | POA: Diagnosis not present

## 2018-07-12 DIAGNOSIS — R197 Diarrhea, unspecified: Secondary | ICD-10-CM | POA: Diagnosis not present

## 2018-07-12 DIAGNOSIS — K219 Gastro-esophageal reflux disease without esophagitis: Secondary | ICD-10-CM | POA: Diagnosis not present

## 2018-07-12 DIAGNOSIS — I5042 Chronic combined systolic (congestive) and diastolic (congestive) heart failure: Secondary | ICD-10-CM | POA: Diagnosis not present

## 2018-07-12 DIAGNOSIS — R918 Other nonspecific abnormal finding of lung field: Secondary | ICD-10-CM | POA: Diagnosis not present

## 2018-07-12 DIAGNOSIS — Z79899 Other long term (current) drug therapy: Secondary | ICD-10-CM | POA: Diagnosis not present

## 2018-07-12 DIAGNOSIS — Z794 Long term (current) use of insulin: Secondary | ICD-10-CM | POA: Diagnosis not present

## 2018-07-12 DIAGNOSIS — I10 Essential (primary) hypertension: Secondary | ICD-10-CM | POA: Diagnosis not present

## 2018-07-12 DIAGNOSIS — J939 Pneumothorax, unspecified: Secondary | ICD-10-CM | POA: Diagnosis not present

## 2018-07-12 DIAGNOSIS — I132 Hypertensive heart and chronic kidney disease with heart failure and with stage 5 chronic kidney disease, or end stage renal disease: Secondary | ICD-10-CM | POA: Diagnosis not present

## 2018-07-12 DIAGNOSIS — J869 Pyothorax without fistula: Secondary | ICD-10-CM | POA: Diagnosis not present

## 2018-07-12 DIAGNOSIS — Z902 Acquired absence of lung [part of]: Secondary | ICD-10-CM | POA: Diagnosis not present

## 2018-07-12 DIAGNOSIS — E1122 Type 2 diabetes mellitus with diabetic chronic kidney disease: Secondary | ICD-10-CM | POA: Diagnosis not present

## 2018-07-12 DIAGNOSIS — N186 End stage renal disease: Secondary | ICD-10-CM | POA: Diagnosis not present

## 2018-07-12 DIAGNOSIS — D631 Anemia in chronic kidney disease: Secondary | ICD-10-CM | POA: Diagnosis not present

## 2018-07-12 DIAGNOSIS — R112 Nausea with vomiting, unspecified: Secondary | ICD-10-CM | POA: Diagnosis not present

## 2018-07-12 DIAGNOSIS — Z992 Dependence on renal dialysis: Secondary | ICD-10-CM | POA: Diagnosis not present

## 2018-07-12 DIAGNOSIS — R109 Unspecified abdominal pain: Secondary | ICD-10-CM | POA: Diagnosis not present

## 2018-07-12 DIAGNOSIS — J90 Pleural effusion, not elsewhere classified: Secondary | ICD-10-CM | POA: Diagnosis not present

## 2018-07-12 DIAGNOSIS — K861 Other chronic pancreatitis: Secondary | ICD-10-CM | POA: Diagnosis not present

## 2018-07-12 DIAGNOSIS — Z9119 Patient's noncompliance with other medical treatment and regimen: Secondary | ICD-10-CM | POA: Diagnosis not present

## 2018-07-12 DIAGNOSIS — G4731 Primary central sleep apnea: Secondary | ICD-10-CM | POA: Diagnosis not present

## 2018-07-12 DIAGNOSIS — R6883 Chills (without fever): Secondary | ICD-10-CM | POA: Diagnosis not present

## 2018-07-12 DIAGNOSIS — R0789 Other chest pain: Secondary | ICD-10-CM | POA: Diagnosis not present

## 2018-07-12 DIAGNOSIS — M791 Myalgia, unspecified site: Secondary | ICD-10-CM | POA: Diagnosis not present

## 2018-07-12 DIAGNOSIS — R0602 Shortness of breath: Secondary | ICD-10-CM | POA: Diagnosis not present

## 2018-07-12 DIAGNOSIS — Z888 Allergy status to other drugs, medicaments and biological substances status: Secondary | ICD-10-CM | POA: Diagnosis not present

## 2018-07-13 DIAGNOSIS — Z992 Dependence on renal dialysis: Secondary | ICD-10-CM | POA: Diagnosis not present

## 2018-07-13 DIAGNOSIS — Z9119 Patient's noncompliance with other medical treatment and regimen: Secondary | ICD-10-CM | POA: Diagnosis not present

## 2018-07-13 DIAGNOSIS — J939 Pneumothorax, unspecified: Secondary | ICD-10-CM | POA: Diagnosis not present

## 2018-07-13 DIAGNOSIS — R918 Other nonspecific abnormal finding of lung field: Secondary | ICD-10-CM | POA: Diagnosis not present

## 2018-07-13 DIAGNOSIS — I10 Essential (primary) hypertension: Secondary | ICD-10-CM | POA: Diagnosis not present

## 2018-07-13 DIAGNOSIS — K219 Gastro-esophageal reflux disease without esophagitis: Secondary | ICD-10-CM | POA: Diagnosis not present

## 2018-07-13 DIAGNOSIS — K861 Other chronic pancreatitis: Secondary | ICD-10-CM | POA: Diagnosis not present

## 2018-07-13 DIAGNOSIS — R0602 Shortness of breath: Secondary | ICD-10-CM | POA: Diagnosis not present

## 2018-07-13 DIAGNOSIS — D631 Anemia in chronic kidney disease: Secondary | ICD-10-CM | POA: Diagnosis not present

## 2018-07-13 DIAGNOSIS — G4731 Primary central sleep apnea: Secondary | ICD-10-CM | POA: Diagnosis not present

## 2018-07-13 DIAGNOSIS — J90 Pleural effusion, not elsewhere classified: Secondary | ICD-10-CM | POA: Diagnosis not present

## 2018-07-13 DIAGNOSIS — N186 End stage renal disease: Secondary | ICD-10-CM | POA: Diagnosis not present

## 2018-07-13 DIAGNOSIS — J869 Pyothorax without fistula: Secondary | ICD-10-CM | POA: Diagnosis not present

## 2018-07-13 DIAGNOSIS — R0789 Other chest pain: Secondary | ICD-10-CM | POA: Diagnosis not present

## 2018-07-14 DIAGNOSIS — N186 End stage renal disease: Secondary | ICD-10-CM | POA: Diagnosis not present

## 2018-07-14 DIAGNOSIS — J869 Pyothorax without fistula: Secondary | ICD-10-CM | POA: Diagnosis not present

## 2018-07-14 DIAGNOSIS — R197 Diarrhea, unspecified: Secondary | ICD-10-CM | POA: Diagnosis not present

## 2018-07-14 DIAGNOSIS — K861 Other chronic pancreatitis: Secondary | ICD-10-CM | POA: Diagnosis not present

## 2018-07-14 DIAGNOSIS — I5022 Chronic systolic (congestive) heart failure: Secondary | ICD-10-CM | POA: Diagnosis not present

## 2018-07-14 DIAGNOSIS — I10 Essential (primary) hypertension: Secondary | ICD-10-CM | POA: Diagnosis not present

## 2018-07-14 DIAGNOSIS — Z992 Dependence on renal dialysis: Secondary | ICD-10-CM | POA: Diagnosis not present

## 2018-07-21 DIAGNOSIS — Z86718 Personal history of other venous thrombosis and embolism: Secondary | ICD-10-CM | POA: Diagnosis not present

## 2018-07-21 DIAGNOSIS — N186 End stage renal disease: Secondary | ICD-10-CM | POA: Diagnosis not present

## 2018-07-21 DIAGNOSIS — N2581 Secondary hyperparathyroidism of renal origin: Secondary | ICD-10-CM | POA: Diagnosis not present

## 2018-07-21 DIAGNOSIS — E875 Hyperkalemia: Secondary | ICD-10-CM | POA: Diagnosis not present

## 2018-07-21 DIAGNOSIS — D631 Anemia in chronic kidney disease: Secondary | ICD-10-CM | POA: Diagnosis not present

## 2018-07-21 DIAGNOSIS — D509 Iron deficiency anemia, unspecified: Secondary | ICD-10-CM | POA: Diagnosis not present

## 2018-07-21 DIAGNOSIS — Z992 Dependence on renal dialysis: Secondary | ICD-10-CM | POA: Diagnosis not present

## 2018-07-22 DIAGNOSIS — N186 End stage renal disease: Secondary | ICD-10-CM | POA: Diagnosis not present

## 2018-07-22 DIAGNOSIS — Z992 Dependence on renal dialysis: Secondary | ICD-10-CM | POA: Diagnosis not present

## 2018-07-22 DIAGNOSIS — T8610 Unspecified complication of kidney transplant: Secondary | ICD-10-CM | POA: Diagnosis not present

## 2018-07-24 DIAGNOSIS — D631 Anemia in chronic kidney disease: Secondary | ICD-10-CM | POA: Diagnosis not present

## 2018-07-24 DIAGNOSIS — R079 Chest pain, unspecified: Secondary | ICD-10-CM | POA: Diagnosis not present

## 2018-07-24 DIAGNOSIS — E875 Hyperkalemia: Secondary | ICD-10-CM | POA: Diagnosis not present

## 2018-07-24 DIAGNOSIS — R0902 Hypoxemia: Secondary | ICD-10-CM | POA: Diagnosis not present

## 2018-07-24 DIAGNOSIS — N2581 Secondary hyperparathyroidism of renal origin: Secondary | ICD-10-CM | POA: Diagnosis not present

## 2018-07-24 DIAGNOSIS — D509 Iron deficiency anemia, unspecified: Secondary | ICD-10-CM | POA: Diagnosis not present

## 2018-07-24 DIAGNOSIS — N186 End stage renal disease: Secondary | ICD-10-CM | POA: Diagnosis not present

## 2018-07-24 DIAGNOSIS — Z992 Dependence on renal dialysis: Secondary | ICD-10-CM | POA: Diagnosis not present

## 2018-07-25 ENCOUNTER — Encounter (HOSPITAL_COMMUNITY): Payer: Self-pay | Admitting: Emergency Medicine

## 2018-07-25 ENCOUNTER — Observation Stay (HOSPITAL_COMMUNITY): Payer: Medicare Other

## 2018-07-25 ENCOUNTER — Emergency Department (HOSPITAL_COMMUNITY): Payer: Medicare Other

## 2018-07-25 ENCOUNTER — Inpatient Hospital Stay (HOSPITAL_COMMUNITY)
Admission: EM | Admit: 2018-07-25 | Discharge: 2018-08-05 | DRG: 186 | Disposition: A | Discharge status: Skilled Nursing Facility | Payer: Medicare Other | Attending: Family Medicine | Admitting: Family Medicine

## 2018-07-25 ENCOUNTER — Other Ambulatory Visit: Payer: Self-pay

## 2018-07-25 DIAGNOSIS — N186 End stage renal disease: Secondary | ICD-10-CM | POA: Diagnosis not present

## 2018-07-25 DIAGNOSIS — Z7901 Long term (current) use of anticoagulants: Secondary | ICD-10-CM

## 2018-07-25 DIAGNOSIS — E1122 Type 2 diabetes mellitus with diabetic chronic kidney disease: Secondary | ICD-10-CM | POA: Diagnosis not present

## 2018-07-25 DIAGNOSIS — I5042 Chronic combined systolic (congestive) and diastolic (congestive) heart failure: Secondary | ICD-10-CM | POA: Diagnosis present

## 2018-07-25 DIAGNOSIS — R0902 Hypoxemia: Secondary | ICD-10-CM | POA: Diagnosis not present

## 2018-07-25 DIAGNOSIS — Z681 Body mass index (BMI) 19 or less, adult: Secondary | ICD-10-CM

## 2018-07-25 DIAGNOSIS — R109 Unspecified abdominal pain: Secondary | ICD-10-CM | POA: Diagnosis present

## 2018-07-25 DIAGNOSIS — K7460 Unspecified cirrhosis of liver: Secondary | ICD-10-CM | POA: Diagnosis not present

## 2018-07-25 DIAGNOSIS — Z8619 Personal history of other infectious and parasitic diseases: Secondary | ICD-10-CM

## 2018-07-25 DIAGNOSIS — E440 Moderate protein-calorie malnutrition: Secondary | ICD-10-CM | POA: Diagnosis not present

## 2018-07-25 DIAGNOSIS — Z9114 Patient's other noncompliance with medication regimen: Secondary | ICD-10-CM | POA: Diagnosis not present

## 2018-07-25 DIAGNOSIS — J869 Pyothorax without fistula: Secondary | ICD-10-CM

## 2018-07-25 DIAGNOSIS — J948 Other specified pleural conditions: Secondary | ICD-10-CM | POA: Diagnosis not present

## 2018-07-25 DIAGNOSIS — Z86718 Personal history of other venous thrombosis and embolism: Secondary | ICD-10-CM

## 2018-07-25 DIAGNOSIS — Z9689 Presence of other specified functional implants: Secondary | ICD-10-CM

## 2018-07-25 DIAGNOSIS — N2581 Secondary hyperparathyroidism of renal origin: Secondary | ICD-10-CM | POA: Diagnosis not present

## 2018-07-25 DIAGNOSIS — G8929 Other chronic pain: Secondary | ICD-10-CM | POA: Diagnosis present

## 2018-07-25 DIAGNOSIS — Z992 Dependence on renal dialysis: Secondary | ICD-10-CM

## 2018-07-25 DIAGNOSIS — Z791 Long term (current) use of non-steroidal anti-inflammatories (NSAID): Secondary | ICD-10-CM

## 2018-07-25 DIAGNOSIS — Z86711 Personal history of pulmonary embolism: Secondary | ICD-10-CM

## 2018-07-25 DIAGNOSIS — E785 Hyperlipidemia, unspecified: Secondary | ICD-10-CM | POA: Diagnosis not present

## 2018-07-25 DIAGNOSIS — Q809 Congenital ichthyosis, unspecified: Secondary | ICD-10-CM | POA: Diagnosis not present

## 2018-07-25 DIAGNOSIS — Z5320 Procedure and treatment not carried out because of patient's decision for unspecified reasons: Secondary | ICD-10-CM | POA: Diagnosis present

## 2018-07-25 DIAGNOSIS — R079 Chest pain, unspecified: Secondary | ICD-10-CM | POA: Diagnosis not present

## 2018-07-25 DIAGNOSIS — M25512 Pain in left shoulder: Secondary | ICD-10-CM | POA: Diagnosis present

## 2018-07-25 DIAGNOSIS — I700 Atherosclerosis of aorta: Secondary | ICD-10-CM | POA: Diagnosis present

## 2018-07-25 DIAGNOSIS — F418 Other specified anxiety disorders: Secondary | ICD-10-CM | POA: Diagnosis present

## 2018-07-25 DIAGNOSIS — I132 Hypertensive heart and chronic kidney disease with heart failure and with stage 5 chronic kidney disease, or end stage renal disease: Secondary | ICD-10-CM | POA: Diagnosis not present

## 2018-07-25 DIAGNOSIS — M545 Low back pain: Secondary | ICD-10-CM | POA: Diagnosis not present

## 2018-07-25 DIAGNOSIS — R1110 Vomiting, unspecified: Secondary | ICD-10-CM | POA: Diagnosis present

## 2018-07-25 DIAGNOSIS — Z9115 Patient's noncompliance with renal dialysis: Secondary | ICD-10-CM

## 2018-07-25 DIAGNOSIS — F1190 Opioid use, unspecified, uncomplicated: Secondary | ICD-10-CM

## 2018-07-25 DIAGNOSIS — Z794 Long term (current) use of insulin: Secondary | ICD-10-CM

## 2018-07-25 DIAGNOSIS — I428 Other cardiomyopathies: Secondary | ICD-10-CM | POA: Diagnosis present

## 2018-07-25 DIAGNOSIS — Z8249 Family history of ischemic heart disease and other diseases of the circulatory system: Secondary | ICD-10-CM

## 2018-07-25 DIAGNOSIS — Z8719 Personal history of other diseases of the digestive system: Secondary | ICD-10-CM

## 2018-07-25 DIAGNOSIS — D631 Anemia in chronic kidney disease: Secondary | ICD-10-CM | POA: Diagnosis present

## 2018-07-25 DIAGNOSIS — K861 Other chronic pancreatitis: Secondary | ICD-10-CM | POA: Diagnosis not present

## 2018-07-25 DIAGNOSIS — Z886 Allergy status to analgesic agent status: Secondary | ICD-10-CM

## 2018-07-25 DIAGNOSIS — Z79891 Long term (current) use of opiate analgesic: Secondary | ICD-10-CM

## 2018-07-25 DIAGNOSIS — D709 Neutropenia, unspecified: Secondary | ICD-10-CM | POA: Diagnosis not present

## 2018-07-25 DIAGNOSIS — J90 Pleural effusion, not elsewhere classified: Secondary | ICD-10-CM | POA: Diagnosis not present

## 2018-07-25 DIAGNOSIS — Z4682 Encounter for fitting and adjustment of non-vascular catheter: Secondary | ICD-10-CM

## 2018-07-25 DIAGNOSIS — Z79899 Other long term (current) drug therapy: Secondary | ICD-10-CM

## 2018-07-25 DIAGNOSIS — Z888 Allergy status to other drugs, medicaments and biological substances status: Secondary | ICD-10-CM

## 2018-07-25 DIAGNOSIS — J939 Pneumothorax, unspecified: Secondary | ICD-10-CM

## 2018-07-25 DIAGNOSIS — Z87891 Personal history of nicotine dependence: Secondary | ICD-10-CM

## 2018-07-25 DIAGNOSIS — Z885 Allergy status to narcotic agent status: Secondary | ICD-10-CM

## 2018-07-25 HISTORY — DX: Unspecified kidney failure: N19

## 2018-07-25 HISTORY — DX: Left upper quadrant abdominal swelling, mass and lump: R19.02

## 2018-07-25 HISTORY — DX: Vomiting, unspecified: R11.10

## 2018-07-25 HISTORY — DX: Low back pain, unspecified: M54.50

## 2018-07-25 HISTORY — DX: Other chronic pain: G89.29

## 2018-07-25 HISTORY — DX: Primary central sleep apnea: G47.31

## 2018-07-25 HISTORY — DX: Other disorders of plasma-protein metabolism, not elsewhere classified: E88.09

## 2018-07-25 HISTORY — DX: Acute embolism and thrombosis of unspecified deep veins of unspecified lower extremity: I82.409

## 2018-07-25 HISTORY — DX: Type 2 diabetes mellitus with other diabetic kidney complication: E11.29

## 2018-07-25 HISTORY — DX: Pleural effusion, not elsewhere classified: J90

## 2018-07-25 HISTORY — DX: Acute embolism and thrombosis of superficial veins of right upper extremity: I82.611

## 2018-07-25 HISTORY — DX: Type 2 diabetes mellitus with hyperglycemia: E11.65

## 2018-07-25 HISTORY — DX: Slow transit constipation: K59.01

## 2018-07-25 HISTORY — DX: Hypoxemia: R09.02

## 2018-07-25 HISTORY — DX: Other pulmonary embolism without acute cor pulmonale: I26.99

## 2018-07-25 HISTORY — DX: Patient's noncompliance with renal dialysis: Z91.15

## 2018-07-25 HISTORY — DX: Dependence on renal dialysis: Z99.2

## 2018-07-25 HISTORY — DX: Epigastric pain: R10.13

## 2018-07-25 HISTORY — DX: Reserved for concepts with insufficient information to code with codable children: IMO0002

## 2018-07-25 HISTORY — DX: Personal history of other venous thrombosis and embolism: Z86.718

## 2018-07-25 HISTORY — DX: Other chronic pancreatitis: K86.1

## 2018-07-25 HISTORY — DX: Personal history of other infectious and parasitic diseases: Z86.19

## 2018-07-25 HISTORY — DX: Unspecified systolic (congestive) heart failure: I50.20

## 2018-07-25 HISTORY — DX: Chest pain, unspecified: R07.9

## 2018-07-25 HISTORY — DX: Encounter for palliative care: Z51.5

## 2018-07-25 HISTORY — DX: Low back pain: M54.5

## 2018-07-25 HISTORY — DX: Chronic kidney disease, stage 5: N18.5

## 2018-07-25 HISTORY — DX: Adjustment disorder with mixed anxiety and depressed mood: F43.23

## 2018-07-25 HISTORY — DX: Hypertensive heart and chronic kidney disease with heart failure and with stage 5 chronic kidney disease, or end stage renal disease: I13.2

## 2018-07-25 HISTORY — DX: Other specified disorders of kidney and ureter: N28.89

## 2018-07-25 HISTORY — DX: Unspecified abdominal pain: R10.9

## 2018-07-25 HISTORY — DX: Essential (primary) hypertension: I10

## 2018-07-25 HISTORY — DX: Fluid overload, unspecified: E87.70

## 2018-07-25 HISTORY — DX: Dyspnea, unspecified: R06.00

## 2018-07-25 HISTORY — DX: Right upper quadrant pain: R10.11

## 2018-07-25 HISTORY — DX: Unspecified cirrhosis of liver: K74.60

## 2018-07-25 HISTORY — DX: Disorder of kidney and ureter, unspecified: N28.9

## 2018-07-25 HISTORY — DX: Atherosclerosis of aorta: I70.0

## 2018-07-25 HISTORY — DX: Hypoglycemia, unspecified: E16.2

## 2018-07-25 HISTORY — DX: Pain in left shoulder: M25.512

## 2018-07-25 HISTORY — DX: Pleurodynia: R07.81

## 2018-07-25 HISTORY — DX: Dependence on renal dialysis: N18.6

## 2018-07-25 HISTORY — DX: Bradycardia, unspecified: R00.1

## 2018-07-25 HISTORY — DX: Chronic systolic (congestive) heart failure: I50.22

## 2018-07-25 LAB — CBC
HCT: 28.3 % — ABNORMAL LOW (ref 39.0–52.0)
Hemoglobin: 8.6 g/dL — ABNORMAL LOW (ref 13.0–17.0)
MCH: 24.6 pg — ABNORMAL LOW (ref 26.0–34.0)
MCHC: 30.4 g/dL (ref 30.0–36.0)
MCV: 80.9 fL (ref 80.0–100.0)
Platelets: 251 10*3/uL (ref 150–400)
RBC: 3.5 MIL/uL — ABNORMAL LOW (ref 4.22–5.81)
RDW: 18.6 % — ABNORMAL HIGH (ref 11.5–15.5)
WBC: 4.6 10*3/uL (ref 4.0–10.5)
nRBC: 0 % (ref 0.0–0.2)

## 2018-07-25 LAB — BASIC METABOLIC PANEL
Anion gap: 13 (ref 5–15)
BUN: 25 mg/dL — ABNORMAL HIGH (ref 6–20)
CHLORIDE: 95 mmol/L — AB (ref 98–111)
CO2: 27 mmol/L (ref 22–32)
Calcium: 9.3 mg/dL (ref 8.9–10.3)
Creatinine, Ser: 9.74 mg/dL — ABNORMAL HIGH (ref 0.61–1.24)
GFR calc non Af Amer: 5 mL/min — ABNORMAL LOW (ref >60)
GFR, EST AFRICAN AMERICAN: 6 mL/min — AB (ref >60)
Glucose, Bld: 88 mg/dL (ref 70–99)
Potassium: 4.4 mmol/L (ref 3.5–5.1)
Sodium: 135 mmol/L (ref 135–145)

## 2018-07-25 LAB — I-STAT TROPONIN, ED: Troponin i, poc: 0.02 ng/mL (ref 0.00–0.08)

## 2018-07-25 LAB — TROPONIN I: Troponin I: <0.03 ng/mL (ref <0.03)

## 2018-07-25 LAB — LIPASE, BLOOD: LIPASE: 31 U/L (ref 11–51)

## 2018-07-25 IMAGING — CR DG CHEST 2V
2 series · 2 of 2 positions shown · non-contrast
Comparison: 02/11/2016

CLINICAL DATA: Short of breath.  Dialysis patient

EXAM:
CHEST  2 VIEW

[chest pa]
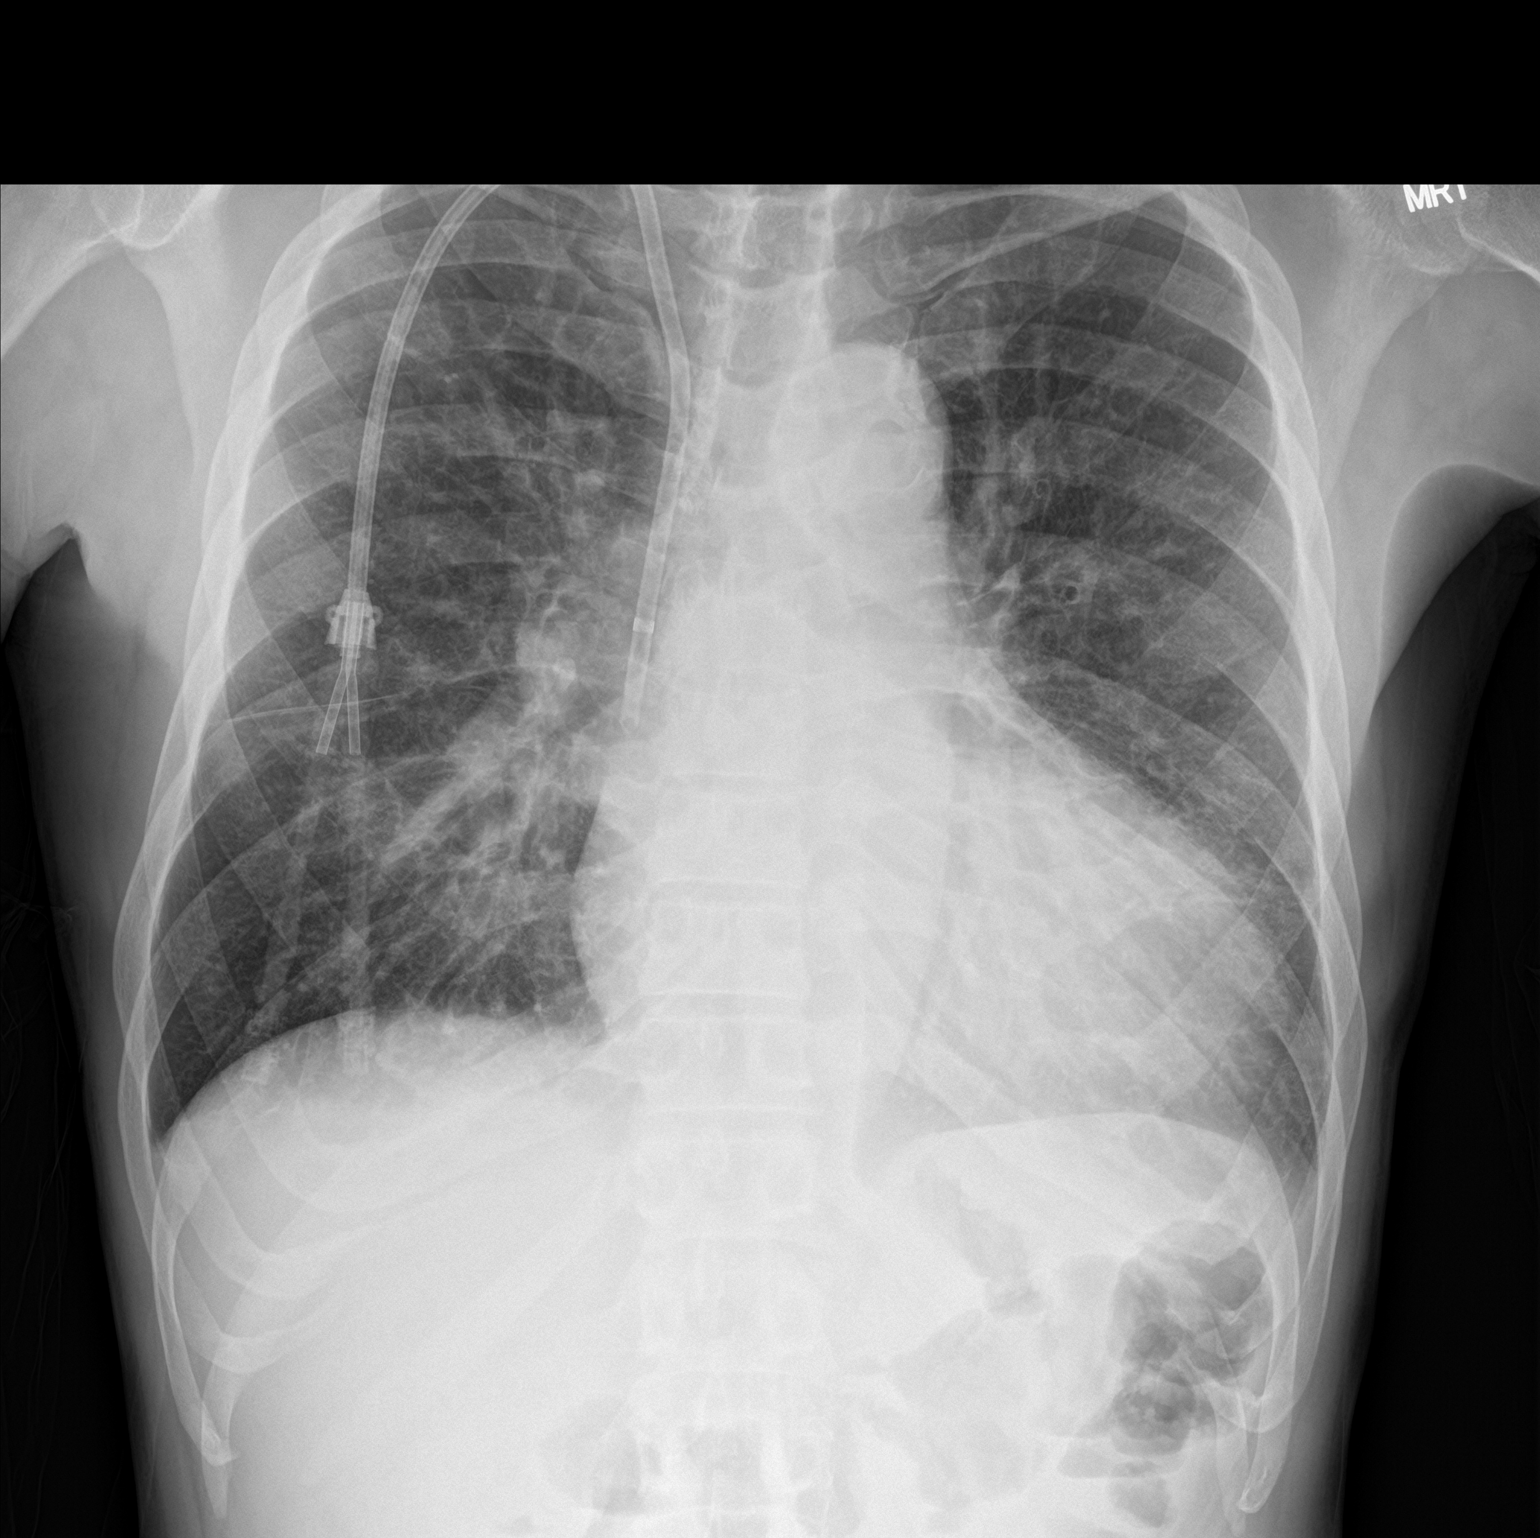

[chest lat]
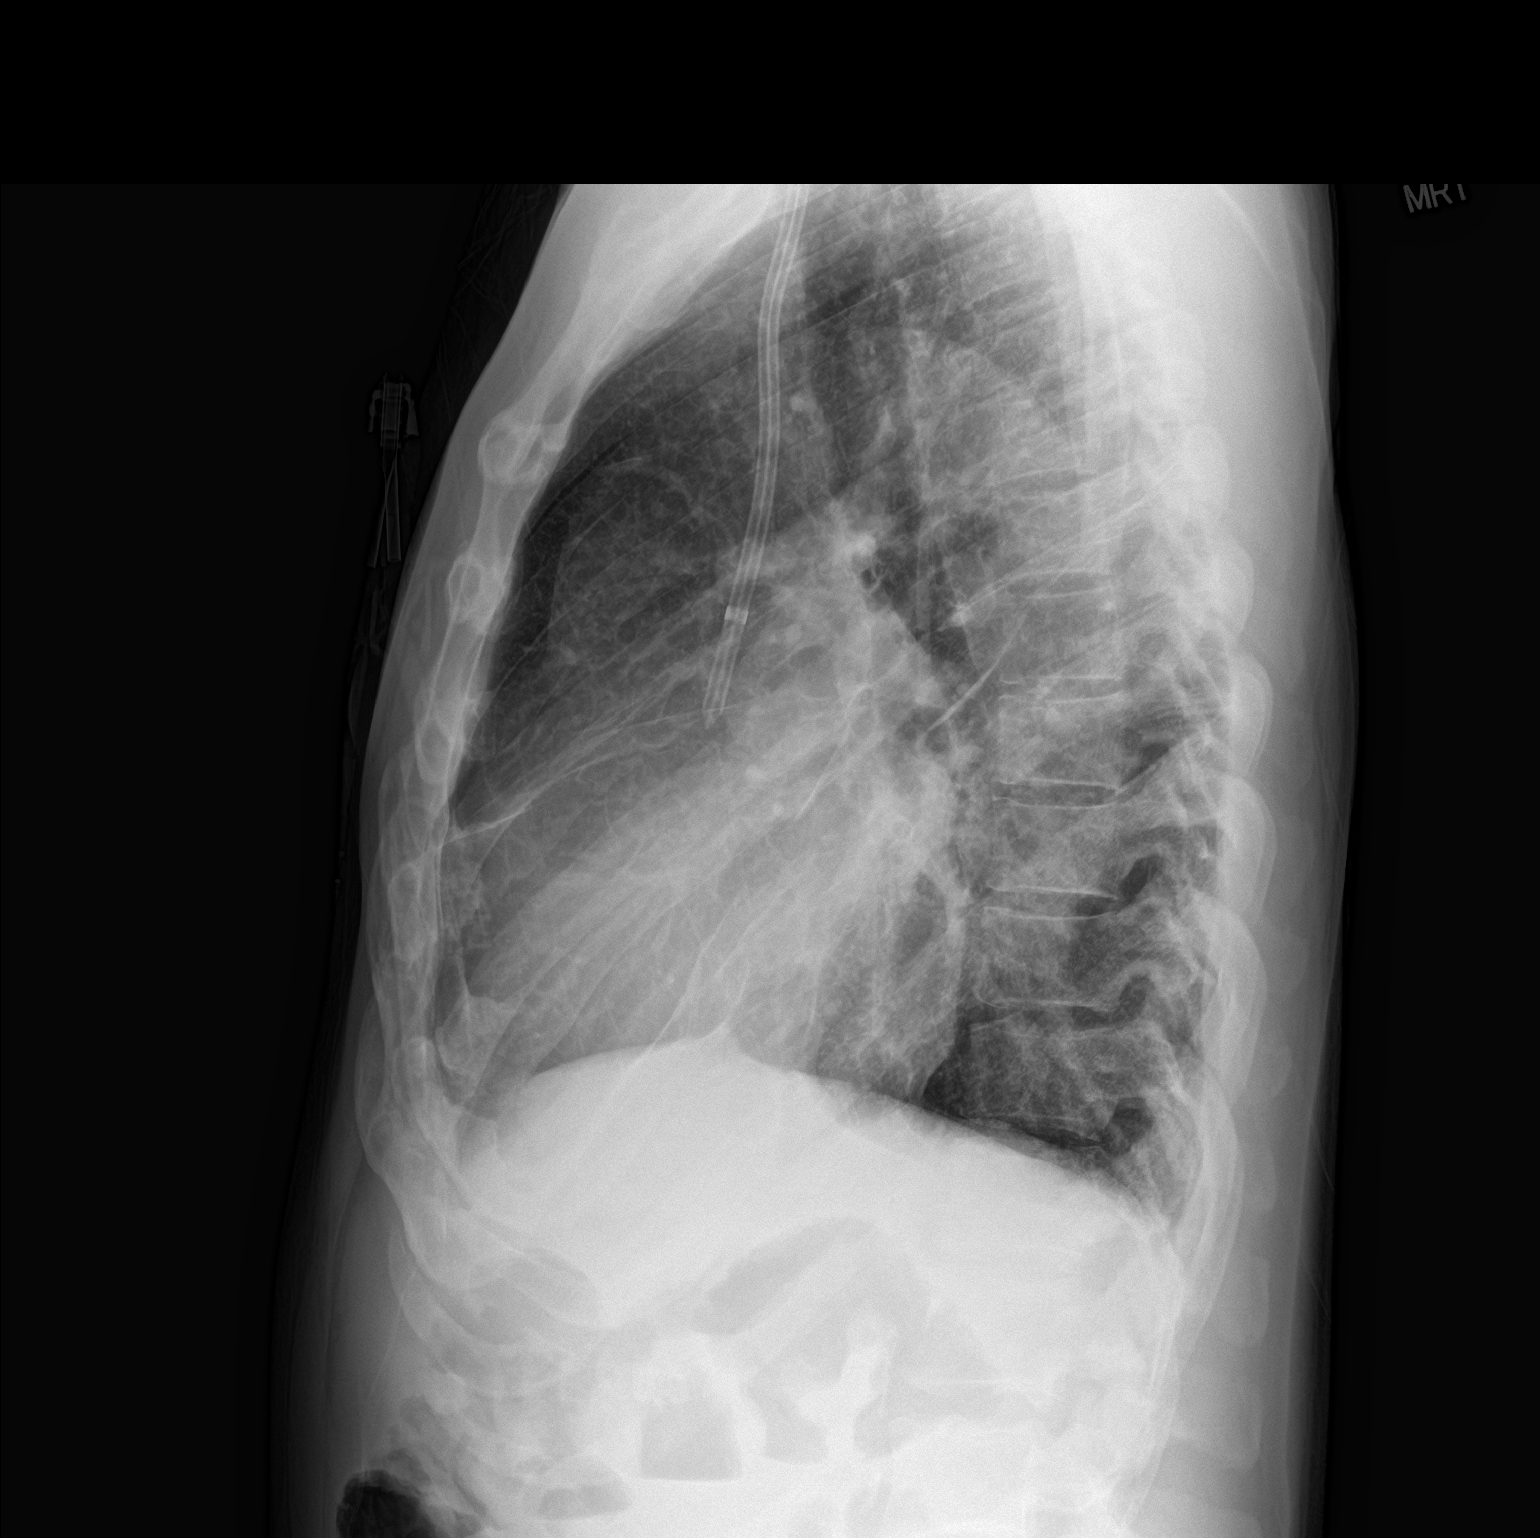

[2 of 2 positions shown; findings below may reference images not displayed]

FINDINGS: Interval improvement in pulmonary edema since the prior study. Mild
vascular congestion. Small pleural effusions. Dialysis catheter
unchanged in position.
IMPRESSION: Improving pulmonary edema.

## 2018-07-25 MED ORDER — PROMETHAZINE HCL 25 MG/ML IJ SOLN
12.5000 mg | Freq: Once | INTRAMUSCULAR | Status: AC
  Administered 2018-07-25: 12.5 mg via INTRAMUSCULAR
  Filled 2018-07-25: qty 1

## 2018-07-25 MED ORDER — ONDANSETRON HCL 4 MG PO TABS
4.0000 mg | ORAL_TABLET | Freq: Three times a day (TID) | ORAL | Status: AC | PRN
  Administered 2018-07-26 – 2018-07-27 (×3): 4 mg via ORAL
  Filled 2018-07-25 (×4): qty 1

## 2018-07-25 MED ORDER — AMLODIPINE BESYLATE 5 MG PO TABS
5.0000 mg | ORAL_TABLET | Freq: Once | ORAL | Status: AC
  Administered 2018-07-25: 5 mg via ORAL
  Filled 2018-07-25: qty 1

## 2018-07-25 MED ORDER — OXYCODONE HCL 5 MG PO TABS
5.0000 mg | ORAL_TABLET | ORAL | Status: DC | PRN
  Administered 2018-07-25 – 2018-07-27 (×8): 5 mg via ORAL
  Filled 2018-07-25 (×10): qty 1

## 2018-07-25 MED ORDER — HYDROMORPHONE HCL 1 MG/ML IJ SOLN
0.5000 mg | Freq: Once | INTRAMUSCULAR | Status: DC | PRN
  Filled 2018-07-25: qty 1

## 2018-07-25 MED ORDER — HYDROMORPHONE HCL 1 MG/ML IJ SOLN
0.5000 mg | Freq: Once | INTRAMUSCULAR | Status: AC | PRN
  Administered 2018-07-25: 0.5 mg via INTRAVENOUS
  Filled 2018-07-25: qty 1

## 2018-07-25 MED ORDER — OXYCODONE HCL 5 MG PO TABS
10.0000 mg | ORAL_TABLET | Freq: Once | ORAL | Status: AC
  Administered 2018-07-25: 10 mg via ORAL
  Filled 2018-07-25: qty 2

## 2018-07-25 MED ORDER — HYDROMORPHONE HCL 1 MG/ML IJ SOLN: 0.5000 mg | Freq: Once | INTRAMUSCULAR | Status: DC | PRN

## 2018-07-25 MED ORDER — HYDROMORPHONE HCL 1 MG/ML IJ SOLN
0.5000 mg | Freq: Once | INTRAMUSCULAR | Status: AC
  Administered 2018-07-25: 0.5 mg via INTRAVENOUS
  Filled 2018-07-25: qty 1

## 2018-07-25 MED ORDER — APIXABAN 5 MG PO TABS
5.0000 mg | ORAL_TABLET | Freq: Once | ORAL | Status: AC
  Administered 2018-07-25: 5 mg via ORAL
  Filled 2018-07-25: qty 1

## 2018-07-25 MED ORDER — HYDRALAZINE HCL 25 MG PO TABS
100.0000 mg | ORAL_TABLET | Freq: Once | ORAL | Status: AC
  Administered 2018-07-25: 100 mg via ORAL
  Filled 2018-07-25: qty 4

## 2018-07-25 NOTE — ED Notes (Signed)
Pt  States he has oxygen at home at all times and has run out at home. MD aware and ask that we contact SW.

## 2018-07-25 NOTE — ED Notes (Signed)
IV attempted right hand without success but labs drawn.

## 2018-07-25 NOTE — ED Notes (Signed)
Decreased O2 sat  On room air noted to Dr. Alvino Chapel.

## 2018-07-25 NOTE — Discharge Instructions (Addendum)
You were admitted for further treatment of the fluid and infection in your lung space. You received a drain and completed treatment. Please follow up outpatient for further treatment.   Acute Pain, Adult Acute pain is a type of pain that may last for just a few days or as long as six months. It is often related to an illness, injury, or medical procedure. Acute pain may be mild, moderate, or severe. It usually goes away once your injury has healed or you are no longer ill. Pain can make it hard for you to do daily activities. It can cause anxiety and lead to other problems if left untreated. Treatment depends on the cause and severity of your acute pain. Follow these instructions at home:  Check your pain level as told by your health care provider.  Take over-the-counter and prescription medicines only as told by your health care provider.  If you are taking prescription pain medicine: ? Ask your health care provider about taking a stool softener or laxative to prevent constipation. ? Do not stop taking the medicine suddenly. Talk to your health care provider about how and when to discontinue prescription pain medicine. ? If your pain is severe, do not take more pills than instructed by your health care provider. ? Do not take other over-the-counter pain medicines in addition to this medicine unless told by your health care provider. ? Do not drive or operate heavy machinery while taking prescription pain medicine.  Apply ice or heat as told by your health care provider. These may reduce swelling and pain.  Ask your health care provider if other strategies such as distraction, relaxation, or physical therapies can help your pain.  Keep all follow-up visits as told by your health care provider. This is important. Contact a health care provider if:  You have pain that is not controlled by medicine.  Your pain does not improve or gets worse.  You have side effects from pain medicines, such  as vomitingor confusion. Get help right away if:  You have severe pain.  You have trouble breathing.  You lose consciousness.  You have chest pain or pressure that lasts for more than a few minutes. Along with the chest pain you may: ? Have pain or discomfort in one or both arms, your back, neck, jaw, or stomach. ? Have shortness of breath. ? Break out in a cold sweat. ? Feel nauseous. ? Become light-headed. These symptoms may represent a serious problem that is an emergency. Do not wait to see if the symptoms will go away. Get medical help right away. Call your local emergency services (911 in the [place] Do not drive yourself to the hospital. This information is not intended to replace advice given to you by your health care provider. Make sure you discuss any questions you have with your health care provider. Document Released: 07/23/2015 Document Revised: 12/15/2015 Document Reviewed: 07/23/2015 Elsevier Interactive Patient Education  2019 [place].  Dialysis Dialysis is a procedure that is done when the kidneys have stopped working properly (kidney failure). It may also be done earlier if it may help improve symptoms. During dialysis, wastes, salt, and extra water are removed from the blood, and the levels of certain minerals in the blood are maintained. Dialysis is done in sessions which are continued until the kidneys get better. If the kidneys cannot get better, such as in end-stage kidney disease, dialysis is continued for life or until you receive a new kidney from a donor (  kidney transplant). There are two types of dialysis: hemodialysis and peritoneal dialysis. What is hemodialysis?        Hemodialysis is when a machine called a dialyzer is used to filter the blood. Before starting hemodialysis, you will have surgery to create a site where blood can be removed from the body and returned to the body (vascular access). There are three types of vascular  accesses:  Arteriovenous fistula. This type of access is created when an artery and a vein (usually in the arm) are connected during surgery. The arteriovenous fistula usually takes 1-6 months to develop after surgery. It may last longer than the other types of vascular accesses and is less likely to become infected or cause blood clots.  Arteriovenous graft. This type of access is created when an artery and a vein in the arm are connected during surgery with a tube. An arteriovenous graft can usually be used within 2-3 weeks of surgery.  A venous catheter. To create this type of access, a thin tube (catheter) is placed in a large vein in your neck, chest, or groin. A venous catheter can be used right away. It is usually used as a temporary access when dialysis needs to begin immediately. During hemodialysis, blood leaves your body through your access site. It travels through a tube to the dialyzer, where it is filtered. The blood then returns to your body through another tube. Hemodialysis is usually done at a hospital or dialysis center three times a week. Visits last about 3-5 hours. With special training, it may also be done at home with the help of another person. What is peritoneal dialysis? Peritoneal dialysis is when the thin lining of the abdomen (peritoneum) and a fluid called dialysate are used to filter the blood. Before starting peritoneal dialysis, you will have surgery to place a catheter in your abdomen. The catheter will be used to transfer dialysate to and from your abdomen. At the start of a session, your abdomen is filled with dialysate. During the session, wastes, salt, and extra water in the blood pass through the peritoneum and into the dialysate. The dialysate is drained from the body at the end of the session. The process of filling and draining the dialysate is called an exchange. Exchanges are repeated until you have used up all the dialysate for the day. You may do peritoneal  dialysis at home or at almost any other location. It is done every day. You may need up to five exchanges a day. Each exchange takes about 30-40 minutes. The amount of time the dialysate is in your body between exchanges is called a dwell. The dwell usually lasts 1.5-3 hours and can vary with each person. You may choose to do exchanges at night while you sleep, using a machine called a cycler. Which type of dialysis should I choose? Both types of dialysis have advantages and disadvantages. Talk with your health care provider about which type of dialysis is best for you. Your lifestyle, preferences, and medical condition should be considered. In some cases, only one type of dialysis can be chosen. Advantages of hemodialysis  It is done less often than peritoneal dialysis.  Someone else can do the dialysis for you.  If you go to a dialysis center: ? Your health care provider can recognize any problems you may be having. ? You can interact with others who are having dialysis. This can provide you with emotional support. Disadvantages of hemodialysis  Hemodialysis may cause cramps and low  blood pressure. It may leave you feeling tired on the days you have the treatment.  If you go to a dialysis center, you will need to make weekly appointments and work around the centers schedule.  You will need to take extra care when traveling. If you usually get treatment in a dialysis center, you will need to arrange to visit a dialysis center near your destination. If you are having treatments at home, you will need to take the dialyzer with you when traveling.  There are more eating restrictions than with peritoneal dialysis. Advantages of peritoneal dialysis  It is less likely than hemodialysis to cause cramps and low blood pressure.  There are fewer eating restrictions than with hemodialysis.  You may do exchanges on your own wherever you are, including when you travel. Disadvantages of peritoneal  dialysis  It is done more often than hemodialysis.  Doing peritoneal dialysis requires you to have a good use (dexterity) of your hands. You must also be able to lift bags.  You must learn how to make your equipment free of germs (sterilization techniques). You will need to use these techniques every day to prevent infection. What changes will I need to make to my diet during dialysis? Both types of dialysis require you to make some changes to your diet. For example, you will need to limit your intake of foods that contain a lot of phosphorus and potassium. You will also need to limit your fluid intake. A diet and nutrition specialist (dietitian) can help you make a meal plan that can help improve your dialysis and your health. What should I expect when starting dialysis? Adjusting to the dialysis treatment, schedule, and diet can take some time. You may need to stop working and may not be able to do some of your normal activities. You may feel anxious or depressed when starting dialysis. Over time, many people feel better overall because of dialysis. You may be able to return to work after making some changes, such as reducing work intensity. Where to find more information  [place]: www.kidney.org  [employer] Kidney Patients: BombTimer.gl  [employer]: www.kidneyfund.org Summary  During dialysis, wastes, salt, and extra water are removed from the blood, and the levels of certain minerals in the blood are maintained. There are two types of dialysis: hemodialysis and peritoneal dialysis.  Hemodialysis is when a machine called a dialyzer is used to filter the blood.  Hemodialysis is usually done by a health care provider at a hospital or dialysis center three times a week.  Peritoneal dialysis is when the peritoneum is used as a filter. You may do peritoneal dialysis at home or at almost any other location.  Both types of dialysis have advantages and  disadvantages. Talk with your health care provider about which type of dialysis is best for you. This information is not intended to replace advice given to you by your health care provider. Make sure you discuss any questions you have with your health care provider. Document Released: 09/28/2002 Document Revised: 09/03/2016 Document Reviewed: 09/03/2016 Elsevier Interactive Patient Education  2019 [employer].
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
## 2018-07-25 NOTE — ED Notes (Signed)
IV team at bedside attempting iv start.

## 2018-07-25 NOTE — ED Notes (Signed)
Medication noted as being iv. Will contact md to clarify order.

## 2018-07-25 NOTE — ED Notes (Signed)
ORDERED DIET TRAY FOR PT

## 2018-07-25 NOTE — ED Notes (Signed)
Tyrone attempted blood draw x 1.  Pt now refusing labs.  Requested O2.  Informed sats are good at this time.  States SOB comes and goes and that he needs oxygen.  Pt was placed on 2 Liters O2 via [place] by Kae Heller, EMT.

## 2018-07-25 NOTE — ED Notes (Signed)
IV team unable to get iv or labs

## 2018-07-25 NOTE — Care Management (Addendum)
ED CM consulted by [facility] RN on [place] concerning assistance with home oxygen, patient states he should have home oxygen, but does not have the set up at home.  ED CM reviewed patient's record and does not see an indication where patient would qualify for home oxygen based on Medicare guidelines. Patient oxygen saturation in the ED on RA and O2 does not meet the criteria for  supplemental home oxygen. ED CM discussed this with patient who verbalized understanding and teach back done. Updated Dr. Alvino Chapel and [facility] RN both verbalize understanding. CM contacted [facility]  Navigation Coordination Team concerning patient being discharged AMA, and was informed that patient is no longer in their program.  No further ED CM needs identified

## 2018-07-25 NOTE — ED Notes (Signed)
Pt again approached to attempt lab draw and pt declines labs. Will inform MD.
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
## 2018-07-25 NOTE — ED Triage Notes (Signed)
C/o pain to back that radiates to chest with SOB x 3 days.  States symptoms worse since having 10 lbs removed at dialysis yesterday.

## 2018-07-25 NOTE — ED Notes (Signed)
Pt placed back on o2 at 2l/[place]

## 2018-07-25 NOTE — ED Notes (Signed)
IV attempted without success.

## 2018-07-25 NOTE — ED Notes (Signed)
Attempted ekg pt wants to wait until IV team is done.
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
## 2018-07-25 NOTE — Progress Notes (Signed)
Spoke with ER nurse regarding patient IV access. Educatued RN  that the patient has been assessed and/or IV attempted by [facility] twice.  Both attempts were unsuccessful.  Encouraged RN to reach out to MD for further access.  RN verbalized understanding.

## 2018-07-25 NOTE — Care Management (Addendum)
ED CM updated patient oxygen saturation dropped, and patient has agreed for further ED eval, for possible hospital stay EDP will consult with Hospitalist. CM contacted [facility] and [facility] to inquire if patient was active with them for home oxygen neither medical supplier has patient in database for home oxygen, patient need to be evaluated and qualify for home oxygen. CM will continue to follow.

## 2018-07-25 NOTE — ED Notes (Signed)
Dr. Ouida Sills contacted and informed that pt does not have and IV and that IV team and multiple nurses have tried to get line. Instructed to "keep trying as pt is to get CT angio.

## 2018-07-25 NOTE — ED Notes (Addendum)
Pt offered regular meds but declines all other than pain meds. Pt states he will let me draw labs in 15 minutes.

## 2018-07-26 ENCOUNTER — Encounter (HOSPITAL_COMMUNITY): Payer: Self-pay | Admitting: Family Medicine

## 2018-07-26 ENCOUNTER — Other Ambulatory Visit: Payer: Self-pay

## 2018-07-26 DIAGNOSIS — R109 Unspecified abdominal pain: Secondary | ICD-10-CM

## 2018-07-26 DIAGNOSIS — N186 End stage renal disease: Secondary | ICD-10-CM

## 2018-07-26 DIAGNOSIS — R079 Chest pain, unspecified: Secondary | ICD-10-CM | POA: Diagnosis not present

## 2018-07-26 DIAGNOSIS — G8929 Other chronic pain: Secondary | ICD-10-CM

## 2018-07-26 DIAGNOSIS — R0902 Hypoxemia: Secondary | ICD-10-CM | POA: Diagnosis present

## 2018-07-26 DIAGNOSIS — N2581 Secondary hyperparathyroidism of renal origin: Secondary | ICD-10-CM | POA: Diagnosis not present

## 2018-07-26 DIAGNOSIS — I428 Other cardiomyopathies: Secondary | ICD-10-CM | POA: Diagnosis present

## 2018-07-26 DIAGNOSIS — N185 Chronic kidney disease, stage 5: Secondary | ICD-10-CM | POA: Diagnosis not present

## 2018-07-26 DIAGNOSIS — Z5320 Procedure and treatment not carried out because of patient's decision for unspecified reasons: Secondary | ICD-10-CM | POA: Diagnosis present

## 2018-07-26 DIAGNOSIS — Z9114 Patient's other noncompliance with medication regimen: Secondary | ICD-10-CM | POA: Diagnosis not present

## 2018-07-26 DIAGNOSIS — I5042 Chronic combined systolic (congestive) and diastolic (congestive) heart failure: Secondary | ICD-10-CM | POA: Diagnosis present

## 2018-07-26 DIAGNOSIS — Z992 Dependence on renal dialysis: Secondary | ICD-10-CM

## 2018-07-26 DIAGNOSIS — M25512 Pain in left shoulder: Secondary | ICD-10-CM | POA: Diagnosis present

## 2018-07-26 DIAGNOSIS — J869 Pyothorax without fistula: Secondary | ICD-10-CM

## 2018-07-26 DIAGNOSIS — D631 Anemia in chronic kidney disease: Secondary | ICD-10-CM | POA: Diagnosis not present

## 2018-07-26 DIAGNOSIS — J939 Pneumothorax, unspecified: Secondary | ICD-10-CM | POA: Diagnosis not present

## 2018-07-26 DIAGNOSIS — F1190 Opioid use, unspecified, uncomplicated: Secondary | ICD-10-CM | POA: Diagnosis not present

## 2018-07-26 DIAGNOSIS — I700 Atherosclerosis of aorta: Secondary | ICD-10-CM | POA: Diagnosis present

## 2018-07-26 DIAGNOSIS — Z9689 Presence of other specified functional implants: Secondary | ICD-10-CM | POA: Diagnosis not present

## 2018-07-26 DIAGNOSIS — Z8619 Personal history of other infectious and parasitic diseases: Secondary | ICD-10-CM | POA: Insufficient documentation

## 2018-07-26 DIAGNOSIS — E1122 Type 2 diabetes mellitus with diabetic chronic kidney disease: Secondary | ICD-10-CM | POA: Diagnosis present

## 2018-07-26 DIAGNOSIS — I132 Hypertensive heart and chronic kidney disease with heart failure and with stage 5 chronic kidney disease, or end stage renal disease: Secondary | ICD-10-CM | POA: Diagnosis not present

## 2018-07-26 DIAGNOSIS — F418 Other specified anxiety disorders: Secondary | ICD-10-CM | POA: Diagnosis present

## 2018-07-26 DIAGNOSIS — J942 Hemothorax: Secondary | ICD-10-CM | POA: Diagnosis not present

## 2018-07-26 DIAGNOSIS — K7460 Unspecified cirrhosis of liver: Secondary | ICD-10-CM | POA: Diagnosis present

## 2018-07-26 DIAGNOSIS — E440 Moderate protein-calorie malnutrition: Secondary | ICD-10-CM | POA: Diagnosis not present

## 2018-07-26 DIAGNOSIS — J9383 Other pneumothorax: Secondary | ICD-10-CM | POA: Diagnosis not present

## 2018-07-26 DIAGNOSIS — J90 Pleural effusion, not elsewhere classified: Secondary | ICD-10-CM | POA: Diagnosis not present

## 2018-07-26 DIAGNOSIS — Z86718 Personal history of other venous thrombosis and embolism: Secondary | ICD-10-CM | POA: Diagnosis not present

## 2018-07-26 DIAGNOSIS — I5020 Unspecified systolic (congestive) heart failure: Secondary | ICD-10-CM | POA: Diagnosis not present

## 2018-07-26 DIAGNOSIS — Z743 Need for continuous supervision: Secondary | ICD-10-CM | POA: Diagnosis not present

## 2018-07-26 DIAGNOSIS — D709 Neutropenia, unspecified: Secondary | ICD-10-CM | POA: Diagnosis not present

## 2018-07-26 DIAGNOSIS — M545 Low back pain: Secondary | ICD-10-CM | POA: Diagnosis present

## 2018-07-26 DIAGNOSIS — I120 Hypertensive chronic kidney disease with stage 5 chronic kidney disease or end stage renal disease: Secondary | ICD-10-CM | POA: Diagnosis not present

## 2018-07-26 DIAGNOSIS — K861 Other chronic pancreatitis: Secondary | ICD-10-CM | POA: Diagnosis not present

## 2018-07-26 DIAGNOSIS — Q809 Congenital ichthyosis, unspecified: Secondary | ICD-10-CM | POA: Diagnosis not present

## 2018-07-26 DIAGNOSIS — R279 Unspecified lack of coordination: Secondary | ICD-10-CM | POA: Diagnosis not present

## 2018-07-26 DIAGNOSIS — R1110 Vomiting, unspecified: Secondary | ICD-10-CM

## 2018-07-26 DIAGNOSIS — E785 Hyperlipidemia, unspecified: Secondary | ICD-10-CM | POA: Diagnosis present

## 2018-07-26 DIAGNOSIS — K219 Gastro-esophageal reflux disease without esophagitis: Secondary | ICD-10-CM | POA: Diagnosis not present

## 2018-07-26 DIAGNOSIS — G4709 Other insomnia: Secondary | ICD-10-CM | POA: Diagnosis not present

## 2018-07-26 DIAGNOSIS — J948 Other specified pleural conditions: Secondary | ICD-10-CM | POA: Diagnosis not present

## 2018-07-26 DIAGNOSIS — Z681 Body mass index (BMI) 19 or less, adult: Secondary | ICD-10-CM | POA: Diagnosis not present

## 2018-07-26 DIAGNOSIS — F419 Anxiety disorder, unspecified: Secondary | ICD-10-CM | POA: Diagnosis not present

## 2018-07-26 HISTORY — DX: Personal history of other infectious and parasitic diseases: Z86.19

## 2018-07-26 HISTORY — DX: Pyothorax without fistula: J86.9

## 2018-07-26 HISTORY — DX: Vomiting, unspecified: R11.10

## 2018-07-26 LAB — COMPREHENSIVE METABOLIC PANEL
ALBUMIN: 2.1 g/dL — AB (ref 3.5–5.0)
ALT: 6 U/L (ref 0–44)
AST: 19 U/L (ref 15–41)
Alkaline Phosphatase: 92 U/L (ref 38–126)
Anion gap: 15 (ref 5–15)
BUN: 31 mg/dL — ABNORMAL HIGH (ref 6–20)
CO2: 26 mmol/L (ref 22–32)
Calcium: 9 mg/dL (ref 8.9–10.3)
Chloride: 94 mmol/L — ABNORMAL LOW (ref 98–111)
Creatinine, Ser: 11.76 mg/dL — ABNORMAL HIGH (ref 0.61–1.24)
GFR calc Af Amer: 5 mL/min — ABNORMAL LOW (ref >60)
GFR calc non Af Amer: 4 mL/min — ABNORMAL LOW (ref >60)
Glucose, Bld: 103 mg/dL — ABNORMAL HIGH (ref 70–99)
Potassium: 4.2 mmol/L (ref 3.5–5.1)
Sodium: 135 mmol/L (ref 135–145)
Total Bilirubin: 1.1 mg/dL (ref 0.3–1.2)
Total Protein: 7.8 g/dL (ref 6.5–8.1)

## 2018-07-26 LAB — CBC
HCT: 26 % — ABNORMAL LOW (ref 39.0–52.0)
Hemoglobin: 8.3 g/dL — ABNORMAL LOW (ref 13.0–17.0)
MCH: 25.8 pg — ABNORMAL LOW (ref 26.0–34.0)
MCHC: 31.9 g/dL (ref 30.0–36.0)
MCV: 80.7 fL (ref 80.0–100.0)
Platelets: 250 10*3/uL (ref 150–400)
RBC: 3.22 MIL/uL — ABNORMAL LOW (ref 4.22–5.81)
RDW: 18.4 % — ABNORMAL HIGH (ref 11.5–15.5)
WBC: 5.5 10*3/uL (ref 4.0–10.5)
nRBC: 0 % (ref 0.0–0.2)

## 2018-07-26 LAB — PHOSPHORUS: Phosphorus: 6.7 mg/dL — ABNORMAL HIGH (ref 2.5–4.6)

## 2018-07-26 LAB — GLUCOSE, CAPILLARY
Glucose-Capillary: 64 mg/dL — ABNORMAL LOW (ref 70–99)
Glucose-Capillary: 93 mg/dL (ref 70–99)

## 2018-07-26 LAB — TROPONIN I: Troponin I: <0.03 ng/mL (ref <0.03)

## 2018-07-26 MED ORDER — ZOLPIDEM TARTRATE 5 MG PO TABS
5.0000 mg | ORAL_TABLET | Freq: Every day | ORAL | Status: DC
  Administered 2018-07-26 – 2018-08-04 (×7): 5 mg via ORAL
  Filled 2018-07-26 (×12): qty 1

## 2018-07-26 MED ORDER — PANCRELIPASE (LIP-PROT-AMYL) 12000-38000 UNITS PO CPEP
12000.0000 [IU] | ORAL_CAPSULE | Freq: Three times a day (TID) | ORAL | Status: DC
  Administered 2018-07-26 – 2018-08-05 (×19): 12000 [IU] via ORAL
  Filled 2018-07-26 (×25): qty 1

## 2018-07-26 MED ORDER — HYDROMORPHONE HCL 1 MG/ML IJ SOLN
1.0000 mg | INTRAMUSCULAR | Status: DC | PRN
  Administered 2018-07-26 – 2018-07-27 (×5): 1 mg via INTRAVENOUS
  Filled 2018-07-26 (×5): qty 1

## 2018-07-26 MED ORDER — ENOXAPARIN SODIUM 30 MG/0.3ML ~~LOC~~ SOLN
30.0000 mg | SUBCUTANEOUS | Status: DC
  Administered 2018-07-26: 30 mg via SUBCUTANEOUS
  Filled 2018-07-26: qty 0.3

## 2018-07-26 MED ORDER — BUSPIRONE HCL 5 MG PO TABS
5.0000 mg | ORAL_TABLET | Freq: Two times a day (BID) | ORAL | Status: DC
  Administered 2018-07-26 – 2018-08-05 (×18): 5 mg via ORAL
  Filled 2018-07-26 (×25): qty 1

## 2018-07-26 MED ORDER — APIXABAN 5 MG PO TABS
5.0000 mg | ORAL_TABLET | Freq: Two times a day (BID) | ORAL | Status: DC
  Administered 2018-07-26: 5 mg via ORAL
  Filled 2018-07-26: qty 1

## 2018-07-26 MED ORDER — DICYCLOMINE HCL 10 MG PO CAPS
10.0000 mg | ORAL_CAPSULE | Freq: Four times a day (QID) | ORAL | Status: DC | PRN
  Filled 2018-07-26: qty 1

## 2018-07-26 MED ORDER — SENNOSIDES-DOCUSATE SODIUM 8.6-50 MG PO TABS: 2.0000 | ORAL_TABLET | Freq: Every evening | ORAL | Status: DC | PRN

## 2018-07-26 MED ORDER — RENA-VITE PO TABS
1.0000 | ORAL_TABLET | Freq: Every day | ORAL | Status: DC
  Administered 2018-07-26 – 2018-08-04 (×8): 1 via ORAL
  Filled 2018-07-26 (×13): qty 1

## 2018-07-26 MED ORDER — CINACALCET HCL 30 MG PO TABS
90.0000 mg | ORAL_TABLET | Freq: Every evening | ORAL | Status: DC
  Administered 2018-07-26 – 2018-08-04 (×5): 90 mg via ORAL
  Filled 2018-07-26 (×11): qty 3

## 2018-07-26 MED ORDER — NITROGLYCERIN 0.4 MG SL SUBL: 0.4000 mg | SUBLINGUAL_TABLET | SUBLINGUAL | Status: DC | PRN

## 2018-07-26 MED ORDER — HYDRALAZINE HCL 50 MG PO TABS
100.0000 mg | ORAL_TABLET | Freq: Three times a day (TID) | ORAL | Status: DC
  Administered 2018-07-26 – 2018-08-05 (×20): 100 mg via ORAL
  Filled 2018-07-26 (×32): qty 2

## 2018-07-26 MED ORDER — PANTOPRAZOLE SODIUM 40 MG PO TBEC
40.0000 mg | DELAYED_RELEASE_TABLET | Freq: Every day | ORAL | Status: DC
  Administered 2018-07-26 – 2018-08-05 (×10): 40 mg via ORAL
  Filled 2018-07-26 (×10): qty 1

## 2018-07-26 MED ORDER — AMLODIPINE BESYLATE 5 MG PO TABS
5.0000 mg | ORAL_TABLET | Freq: Every day | ORAL | Status: DC
  Administered 2018-07-26 – 2018-08-05 (×9): 5 mg via ORAL
  Filled 2018-07-26 (×10): qty 1

## 2018-07-26 MED ORDER — ONDANSETRON HCL 4 MG/2ML IJ SOLN
4.0000 mg | Freq: Once | INTRAMUSCULAR | Status: AC
  Administered 2018-07-26: 4 mg via INTRAVENOUS
  Filled 2018-07-26: qty 2

## 2018-07-26 MED ORDER — POLYETHYLENE GLYCOL 3350 17 G PO PACK: 17.0000 g | PACK | Freq: Every day | ORAL | Status: DC | PRN

## 2018-07-26 MED ORDER — LANTHANUM CARBONATE 500 MG PO CHEW
1000.0000 mg | CHEWABLE_TABLET | Freq: Three times a day (TID) | ORAL | Status: DC
  Administered 2018-07-26 – 2018-08-05 (×14): 1000 mg via ORAL
  Filled 2018-07-26 (×26): qty 2

## 2018-07-26 MED ORDER — DICLOFENAC SODIUM 1 % TD GEL
2.0000 g | Freq: Two times a day (BID) | TRANSDERMAL | Status: DC | PRN
  Administered 2018-07-29 – 2018-08-04 (×4): 2 g via TOPICAL
  Filled 2018-07-26 (×2): qty 100

## 2018-07-26 NOTE — Care Management (Signed)
Received consul for DME O2.  D/W RN.  Pt will need to be assessed with activity and documentation entered to process DME O2.  RN aware.  Will continue to monitor for plan.

## 2018-07-26 NOTE — Progress Notes (Signed)
Subjective: Patient examined, today's CT scan of chest and recent chest x-ray images personally reviewed. 53 year old patient on chronic hemodialysis was admitted through the ED with right chest pain and shortness of breath.  In mid December he was hospitalized at [facility] for loculated large right pleural effusion.  This had been previously drained with several thoracentesis procedures but recurred.  The pleural fluid had moderate white cells but negative culture.  Cytology was negative.  The patient underwent right thoracotomy for drainage of the right loculated pleural effusion and decortication of the collapsed right lower lobe.  Pathology on the tissue was benign.  Final cultures were no growth.  CT scan performed today shows significant improvement in aeration of the right lower lobe with residual expected postoperative pleural thickening but no evidence of empyema.  There is a lateral hydropneumothorax measuring 10cm x 3 cm. The surgical incision appears to be healing well.  On exam the patient has significant postthoracotomy tenderness and pain which is expected.  His pain has radiated to the right anterior mammary crease  which is typical.  Breath sounds are somewhat diminished at the right base.  The hydropneumothorax is probably residual space from inability of the entrapped lower lobe to reexpand. Patient has been off antibiotics since his discharge and on admission has normal white count and is afebrile.  I would recommend CT-guided interventional radiology pigtail catheter placement in the hydropneumothorax to sample fluid for culture and to see if the space can be minimized with suction. I have stopped the Eliquis the patient has been taking for a remote pulmonary embolus and placed him on prophylactic dose Lovenox until he has the procedure.  I will follow the results of the CT-guided catheter placement and would expect this to be a fairly short-term  treatment Objective: Vital signs in last 24 hours: Temp:  [98.2 F (36.8 C)-98.8 F (37.1 C)] 98.7 F (37.1 C) (01/05 1345) Pulse Rate:  [76-83] 80 (01/05 1345) Cardiac Rhythm: Normal sinus rhythm (01/05 1345) Resp:  [16-22] 16 (01/05 0712) BP: (134-167)/(75-98) 146/83 (01/05 1345) SpO2:  [93 %-100 %] 98 % (01/05 1345) Weight:  [77.1 kg] 77.1 kg (01/04 2238)  Hemodynamic parameters for last 24 hours:    Intake/Output from previous day: No intake/output data recorded. Intake/Output this shift: Total I/O In: 240 [P.O.:240] Out: -   Exam Patient is uncomfortable and uncooperative Neck without subcutaneous air or swelling Chest diminished breath sounds right base, right recent thoracotomy incision healing appropriately Cor regular rhythm without murmur or rub  Lab Results: Recent Labs    07/25/18 1135 07/26/18 0616  WBC 4.6 5.5  HGB 8.6* 8.3*  HCT 28.3* 26.0*  PLT 251 250   BMET:  Recent Labs    07/25/18 1135 07/26/18 0616  NA 135 135  K 4.4 4.2  CL 95* 94*  CO2 27 26  GLUCOSE 88 103*  BUN 25* 31*  CREATININE 9.74* 11.76*  CALCIUM 9.3 9.0    PT/INR: No results for input(s): LABPROT, INR in the last 72 hours. ABG    Component Value Date/Time   HCO3 19.3 (L) 12/31/2014 0801   TCO2 31 06/15/2018 2129   ACIDBASEDEF 7.0 (H) 12/31/2014 0801   O2SAT 45.0 12/31/2014 0801   CBG (last 3)  Recent Labs    07/26/18 0532 07/26/18 0611  GLUCAP 64* 93    Assessment/Plan: S/P  Patient does not need redo VATS I have ordered interventional radiology to evaluate for placement of a pigtail catheter in the right  lateral hydropneumothorax space and sample fluid for culture.   LOS: 0 days    Tharon Aquas Trigt III 07/26/2018

## 2018-07-26 NOTE — Care Management Obs Status (Signed)
[facility] NOTIFICATION   Patient Details  Name: Frank [name] MRN: [id number] Date of Birth: 1966-06-10   Medicare Observation Status Notification Given:  Ernesta Amble, RN 07/26/2018, 4:26 PM

## 2018-07-26 NOTE — Progress Notes (Signed)
[name]: [phone]  Patient name: Frank [name] Medical record number: [id number] Date of birth: 1965/09/17 Age: 53 y.o. Gender: male  Primary Care Provider: Patient, [name] Consultants: Nephrology  Code Status: Full   Pt Overview and Major Events to Date:  Admitted to [facility] on 07/25/18 1/4: CXR: oculated right sided pleural effusion improved from last xray with possible extrapleural air component at right lung base seen on previous CT 12/2 1/5: CT: pleural effusion and pleural thickening in the R hemithorax with large loculated anterolateral hydropneumothroax in R chest, favoring recurrent empyema and surgery  Assessment and Plan: Frank [name] is a 53 y.o. male presenting with SOB, R-sided chest pain, nausea/vomiting, diarrhea. PMH is significant for right sided empyema with thoracotomy 06/2018, VTE on Eliquis, chronic pancreatitis.   Acute hypoxia without respiratory distress  Recurrent pleural effusions  Empyema- s/p thoracotomy for empyema and decortication of collapsed RLL on 06/2018 and 10 day course of zosyn at [facility], cultures/cytology negative at that time, pathology on tissue benign, Chest tube removed on 12/18. CT significant for residual post-op pleural thickening, right sided pleural effusion, and lateral hydropneumothorax suspected to be residual space 2/2 to inability of lower lobe to reexpand. Patient has remained afebrile without leukocytosis. Currently satting 93% on RA. Still tender along right chest at incision line, reportedly improved with pain meds. Cardiothoracic surgery consulted and recommends CT-guided interventional radiology pigtail catheter placement in the hydropneumothorax to sample fluid for culture and attempt minimization of space with suction. IR consulted, will follow-up. Concerning pain management, see below.   - continuous pulse ox - Pain control with Dilaudid 31m IV - increase from q4 to  q6hrs - Stop Oxy 551m - ambulatory pulse ox - case management consult for home oxygen - PT/OT consult - topical voltaren gel PRN (home med) for musculoskeletal component - Zofran PRN -  IR consulted for CT-guided pigtail catheter placement, will follow-up - Hold Eliquis, Begin Lovenox for DVT prophylaxis   ESRD (mwf): Nephrology following. H/o non adherence to HD. Home meds: lanthanum 1,00031mID. Scheduled for HD today. Wt on admission 77.1. EDW [phone] consulted, appreciate recommendations - renal diet - avoid nephrotoxic drugs.  H/o Chronic pancreatitis with acute N/V/Diarrea: improving Lipase WNL this admission. Notes improvement in symptoms; denies any further diarrhea.  Home meds: creon 12000 units TID, multivitamin daily, nutritional supplements - Continue to encourage oral hydration - I/O monitoring - monitor for constipation as patient is on opioids - Continue bentyl 15m52mD PRN - continue creon TID before meals - continue multivitamin - nutrition consulted  Anemia of chronic disease stable. hgb 8.6>8.3. Transfusion threshold <7.0. - continue to monitor - nephrology following, appreciate recommendations   Chronic pain requiring opioids Secondary to thoracotomy and chronic pancreatitis. Ran out of post op pain medications ~07/22/18. [place] controlled substance database reviewed. Of note, per PDMP, patient has been seen by 8 different physicians within the last 6 months for opioids. Will refrain from giving opioid pain meds at all costs. Will begin by weaning Dilaudid to q6 hours and stopping the Oxy. - Wean Dilaudid 1mg 22mfrom q4 to q6 hours - Stop Oxy 5mg  68mpression/anxiety Home meds: alprazolam .5mg BI49mRN, buspar 5mg BID75molpidem 5mg QHS 59montinue buspar - continue zolpidem - hold alprazolam (has not been filled since July 2019, may need to remove from med list)  Chronic combined CHF home meds: nitrostat PRN - continue nitrostat  PRN  HTN Home  meds: amlodipine 58m daily, hydralazine 1056mTID. BP this AM 138/81, improved from 150's/90's yesterday. Currently on home meds.  - continue home amlodipine and hydralazine - monitor BP per floor protocol   Ichthyosis (unknown type)- diffuse cracked skin - moisturizers PRN  FEN/GI: renal diet, fluid restriction, Protonix, Miralax, Senokot Prophylaxis: Lovenox  Disposition: continued inpatient stay   Subjective:  Patient reports increased pain today after multiple physicians have examined him. Denied me ability to examine him until very end and only allowed light touching. Otherwise, denies nausea, vomiting, or diarrhea. Improved appetite and his ability for tolerating PO is improving.  Objective: Temp:  [98.3 F (36.8 C)-98.7 F (37.1 C)] 98.7 F (37.1 C) (01/06 1800) Pulse Rate:  [75-91] 75 (01/06 1800) Resp:  [18] 18 (01/06 1800) BP: (137-144)/(81-102) 144/102 (01/06 1800) SpO2:  [88 %-99 %] 99 % (01/06 1800)  Physical Exam: General: well nourished, well developed, lying on his side in bed, in no acute distress with non-toxic appearance HEENT: normocephalic, atraumatic, moist mucous membranes CV: regular rate and rhythm without murmurs, rubs, or gallops, no lower extremity edema Lungs: normal work of breathing, decreased breath sounds appreciated on right side, otherwise good air movement throughout without wheezes, rales or rhonchi.  Abdomen: soft, non-tender, non-distended, normoactive bowel sounds Skin: dry skin diffusely, large incision scare along right side of chest Extremities: warm and well perfused, normal tone MSK: right sided chest wall extremely tender to touch   Laboratory: Recent Labs  Lab 07/25/18 1135 07/26/18 0616  WBC 4.6 5.5  HGB 8.6* 8.3*  HCT 28.3* 26.0*  PLT 251 250   Recent Labs  Lab 07/25/18 1135 07/26/18 0616  NA 135 135  K 4.4 4.2  CL 95* 94*  CO2 27 26  BUN 25* 31*  CREATININE 9.74* 11.76*  CALCIUM 9.3 9.0  PROT  --  7.8   BILITOT  --  1.1  ALKPHOS  --  92  ALT  --  6  AST  --  19  GLUCOSE 88 103*     Imaging/Diagnostic Tests: Dg Chest 2 View  Result Date: 07/25/2018 CLINICAL DATA:  Chest pain. EXAM: CHEST - 2 VIEW COMPARISON:  Most recent chest radiograph 06/22/2018. Lung bases from abdominal CT 06/22/2018 FINDINGS: Decreased right pleural effusion from prior radiograph, partially loculated small volume of pleural fluid remains. Possible partially loculated extrapleural air at the right lung base, not well assessed radiographically. Patchy opacities throughout the right hemithorax. Cardiomegaly is unchanged. Mild interstitial edema suspected. IMPRESSION: 1. Decreased right pleural effusion from prior radiograph, residual partially loculated pleural effusion persists. Possible extrapleural air component at the right lung base, as seen from included lung bases on abdominal CT 06/22/2018. 2. Chronic cardiomegaly with mild interstitial edema. Patchy opacities throughout the right hemithorax may represent asymmetric edema or superimposed infection. Electronically Signed   By: MeKeith Rake.D.   On: 07/25/2018 03:56   Ct Chest Wo Contrast  Result Date: 07/26/2018 CLINICAL DATA:  Shortness of breath; history CHF, end-stage renal disease on dialysis, non ischemic cardiomyopathy, hypertension, underwent thoracotomy for empyema in December 2019. EXAM: CT CHEST WITHOUT CONTRAST TECHNIQUE: Multidetector CT imaging of the chest was performed following the standard protocol without IV contrast. IV contrast was not administered since no adequate IV access could be established. Sagittal and coronal MPR images reconstructed from axial data set. COMPARISON:  01/31/2018 CT chest Correlation: CT abdomen and pelvis 06/22/2018 FINDINGS: Cardiovascular: Scattered atherosclerotic calcifications of aorta, coronary arteries and proximal  great vessels. Enlargement of cardiac chambers. Small to moderate sized pericardial effusion slightly  increased from previous exam. Upper normal caliber ascending thoracic aorta. Mediastinum/Nodes: Mildly enlarged precarinal lymph node 12 mm short axis image 64. Additional AP window node 14 mm short axis image 64. Normal sized RIGHT paratracheal and prevascular nodes. Base of cervical region normal appearance. Esophagus unremarkable. Lungs/Pleura: Central peribronchial thickening. Consolidation versus mass RIGHT lower lobe 4.7 x 3.8 cm image 111. Scattered pleural effusion and pleural thickening throughout lateral and posterior RIGHT hemithorax with loculated hydropneumothorax at the anterolateral RIGHT chest, likely representing residual or recurrent empyema in a patient with recent empyema. Basilar portion of RIGHT has been present since the prior CT exam from December 2019, though the cranial extent of this was not previously imaged. Compressive atelectasis in RIGHT upper and RIGHT middle lobes. Upper Abdomen: Atrophic upper kidneys. Probable cyst upper pole LEFT kidney 20 x 20 mm. Remaining visualized upper abdomen unremarkable. Musculoskeletal: Diffuse osseous sclerosis likely renal osteodystrophy in patient with history of renal failure. IMPRESSION: Pleural effusion and pleural thickening in the RIGHT hemithorax with a large loculated anterolateral hydropneumothorax in the RIGHT chest, favor recurrent empyema in patient with recent empyema and surgery; recommend correlation with fluid analysis. At least the basilar portion of the RIGHT hydropneumothorax has been present since December 2012 though full extent has not been previously imaged. Extensive peribronchial thickening with rounded area of opacity in the RIGHT lower lobe approximately 4.7 x 3.8 cm question rounded pneumonia versus neoplasm. Enlargement of cardiac chambers with small to moderate-sized pericardial effusion increased since previous exam. Aortic Atherosclerosis (ICD10-I70.0). Findings called to the patient's nurse Susana RN on [facility] on  07/26/2018 at 0035 hrs. Electronically Signed   By: Lavonia Dana M.D.   On: 07/26/2018 00:47    Danna Hefty, DO 07/27/2018, 6:19 PM PGY-2, [place] Intern pager: [phone], text pages welcome

## 2018-07-26 NOTE — Progress Notes (Signed)
Pt refusing any medications and assessment until he receives medication to make him feel better he said. MD paged and awaiting on return call back. Will continue to closely monitor. Delia Heady RN

## 2018-07-26 NOTE — Progress Notes (Signed)
[name]: [phone]  Patient name: Frank [name] Medical record number: [id number] Date of birth: 10-04-1965 Age: 53 y.o. Gender: male  Primary Care Provider: Patient, [name] Consultants: Nephrology  Code Status: Full   Pt Overview and Major Events to Date:  Admitted to [facility] on 07/25/18  Assessment and Plan: Frank [name] is a 53 y.o. male presenting with SOB, R-sided chest pain, nausea/vomiting, diarrhea. PMH is significant for empyema with thoracotomy 06/07/2018  Acute hypoxia without respiratory distress  Recurrent pleural effusions  Empyema- s/p thoracotomy for empyema 06/2018 at [facility]. Chest xray showed loculated right sided pleural effusion improved from last xray with possible extrapleural air component at right lung base seen on previous CT 12/2. CT showing pleural effusion and pleural thickening in the R hemithorax with large loculated anterolateral hydropneumothroax in R chest, favoring recurrent empyema and surgery.  Recent admission at [facility] for empyema at which time patient received right thoracotomy and decortication and drainage of right empyema on 07/09/18 as well as completion of 10 day course of zosyn (cultures negative). Chest tube removed on 12/18. Currently satting 93% on 2L O2 - continuous pulse ox - pain control with oxy 40m q6hr PRN - oxygen PRN - ambulatory pulse ox - case management consult for home oxygen - PT/OT consult - topical voltaren gel PRN (home med) for musculoskeletal component - Zofran PRN - CVTS consulted for possible drain placement, will plan to see patient today. Appreciate recommendations. Will defer antibiotics pending consultation   ESRD (mwf) Nephrology consulted from ED for routine dialysis. H/o non adherence to HD. Home meds: lanthanum 1,0013mTID.   - nephrology consulted, appreciate recommendations - renal diet  N/V, diarrhea In the setting of chronic pancreatitis.  Lipase wnl on admission.  - encourage oral hydration - I/O monitoring- on opioid medications as well so don't want to cause constipation either - continue bentyl - monitor on serial abdominal exams   H/o pancreatitis lipase this admission wnl. Home meds: creon 12000 units TID, multivitamin daily, nutritional supplements - continue creon - continue multivitamin - nutrition consult  Anemia of chronic disease stable. hgb 8.6>8.3. Transfusion threshold <7.0. - continue to monitor - nephrology following, appreciate recommendations   Chronic pain requiring opioids secondary to thoracotomy and chronic pancreatitis. ran out of post op pain medications ~3 days ago per patient. [place] controlled substance database reviewed at admission. - 29m80mxy q6hr PRN  Depression/anxiety home meds: alprazolam .29mg329mD PRN, buspar 29mg 32m, zolpidem 29mg Q23m- continue buspar - continue zolpidem - hold alprazolam (has not been filled since July 2019, may need to remove from med list)  Chronic combined CHF home meds: nitrostat PRN - continue nitrostat PRN  HTN home meds: amlodipine 29mg da69m, hydralazine 100mg TI62memains hypertensive with BP 157/90 on 07/26/18. Patient states that he has not been able to take his medications 2/2 nausea/vomiting. Patient denies any changes in vision.- has history of malignant hypertension - continue home amlodipine and hydralazine - monitor BP per floor protocol   Ichthyosis (unknown type)- diffuse cracked skin - moisturizers PRN  FEN/GI: renal diet, fluid restriction Prophylaxis: eliquis  Disposition: continued inpatient stay   Subjective:  Patient today reporting increased pain. When I arrived to room nasal prongs were on patients forehead and he was sitting up at side of bed comfortably eating breakfast. After I entered patient stated he was in pain and slumped over in bed. He stated he had trouble breathing  unless having supplemental O2.    Objective: Temp:  [98.2 F (36.8 C)-98.8 F (37.1 C)] 98.2 F (36.8 C) (01/05 0712) Pulse Rate:  [76-83] 76 (01/05 0712) Resp:  [16-22] 16 (01/05 0712) BP: (134-167)/(75-98) 157/90 (01/05 0712) SpO2:  [93 %-100 %] 93 % (01/05 0712) Weight:  [77.1 kg] 77.1 kg (01/04 2238) Physical Exam: General: awake and alert, sitting at edge of bed eating breakfast, NAD  Cardiovascular: RRR, no MRG, tenderness to palpation.  Respiratory: CTAB, difficult exam due to patient effort  Abdomen: refused exam  Extremities: no edema, no cyanosis   Laboratory: Recent Labs  Lab 07/25/18 1135 07/26/18 0616  WBC 4.6 5.5  HGB 8.6* 8.3*  HCT 28.3* 26.0*  PLT 251 250   Recent Labs  Lab 07/25/18 1135 07/26/18 0616  NA 135 135  K 4.4 4.2  CL 95* 94*  CO2 27 26  BUN 25* 31*  CREATININE 9.74* 11.76*  CALCIUM 9.3 9.0  PROT  --  7.8  BILITOT  --  1.1  ALKPHOS  --  92  ALT  --  6  AST  --  19  GLUCOSE 88 103*     Imaging/Diagnostic Tests: Dg Chest 2 View  Result Date: 07/25/2018 CLINICAL DATA:  Chest pain. EXAM: CHEST - 2 VIEW COMPARISON:  Most recent chest radiograph 06/22/2018. Lung bases from abdominal CT 06/22/2018 FINDINGS: Decreased right pleural effusion from prior radiograph, partially loculated small volume of pleural fluid remains. Possible partially loculated extrapleural air at the right lung base, not well assessed radiographically. Patchy opacities throughout the right hemithorax. Cardiomegaly is unchanged. Mild interstitial edema suspected. IMPRESSION: 1. Decreased right pleural effusion from prior radiograph, residual partially loculated pleural effusion persists. Possible extrapleural air component at the right lung base, as seen from included lung bases on abdominal CT 06/22/2018. 2. Chronic cardiomegaly with mild interstitial edema. Patchy opacities throughout the right hemithorax may represent asymmetric edema or superimposed infection. Electronically Signed   By: Keith Rake  M.D.   On: 07/25/2018 03:56   Ct Chest Wo Contrast  Result Date: 07/26/2018 CLINICAL DATA:  Shortness of breath; history CHF, end-stage renal disease on dialysis, non ischemic cardiomyopathy, hypertension, underwent thoracotomy for empyema in December 2019. EXAM: CT CHEST WITHOUT CONTRAST TECHNIQUE: Multidetector CT imaging of the chest was performed following the standard protocol without IV contrast. IV contrast was not administered since no adequate IV access could be established. Sagittal and coronal MPR images reconstructed from axial data set. COMPARISON:  01/31/2018 CT chest Correlation: CT abdomen and pelvis 06/22/2018 FINDINGS: Cardiovascular: Scattered atherosclerotic calcifications of aorta, coronary arteries and proximal great vessels. Enlargement of cardiac chambers. Small to moderate sized pericardial effusion slightly increased from previous exam. Upper normal caliber ascending thoracic aorta. Mediastinum/Nodes: Mildly enlarged precarinal lymph node 12 mm short axis image 64. Additional AP window node 14 mm short axis image 64. Normal sized RIGHT paratracheal and prevascular nodes. Base of cervical region normal appearance. Esophagus unremarkable. Lungs/Pleura: Central peribronchial thickening. Consolidation versus mass RIGHT lower lobe 4.7 x 3.8 cm image 111. Scattered pleural effusion and pleural thickening throughout lateral and posterior RIGHT hemithorax with loculated hydropneumothorax at the anterolateral RIGHT chest, likely representing residual or recurrent empyema in a patient with recent empyema. Basilar portion of RIGHT has been present since the prior CT exam from December 2019, though the cranial extent of this was not previously imaged. Compressive atelectasis in RIGHT upper and RIGHT middle lobes. Upper Abdomen: Atrophic upper kidneys. Probable cyst upper pole LEFT kidney  20 x 20 mm. Remaining visualized upper abdomen unremarkable. Musculoskeletal: Diffuse osseous sclerosis likely  renal osteodystrophy in patient with history of renal failure. IMPRESSION: Pleural effusion and pleural thickening in the RIGHT hemithorax with a large loculated anterolateral hydropneumothorax in the RIGHT chest, favor recurrent empyema in patient with recent empyema and surgery; recommend correlation with fluid analysis. At least the basilar portion of the RIGHT hydropneumothorax has been present since December 2012 though full extent has not been previously imaged. Extensive peribronchial thickening with rounded area of opacity in the RIGHT lower lobe approximately 4.7 x 3.8 cm question rounded pneumonia versus neoplasm. Enlargement of cardiac chambers with small to moderate-sized pericardial effusion increased since previous exam. Aortic Atherosclerosis (ICD10-I70.0). Findings called to the patient's nurse Susana RN on [facility] on 07/26/2018 at 0035 hrs. Electronically Signed   By: Lavonia Dana M.D.   On: 07/26/2018 00:47     Caroline More, DO 07/26/2018, 1:08 PM PGY-2, [place] Intern pager: [phone], text pages welcome

## 2018-07-26 NOTE — Progress Notes (Signed)
Pt is not willing to ambulate. O2 lowered to 1 lpm. Will recheck sats.

## 2018-07-26 NOTE — Progress Notes (Signed)
Pt refused hydralazine and refused to ambulate.

## 2018-07-26 NOTE — Progress Notes (Signed)
Pt refusing to ambulate at this time  Nephrology present as well

## 2018-07-26 NOTE — Progress Notes (Signed)
[facility] Progress Note   Subjective:  Having pain R chest from prior procedure. Doesn't want to be touched.    Objective Vitals:   07/25/18 1805 07/25/18 2142 07/25/18 2238 07/26/18 0712  BP: (!) 152/83 (!) 167/98  (!) 157/90  Pulse: 81 83  76  Resp: (!) 21 (!) 21  16  Temp:  98.8 F (37.1 C)  98.2 F (36.8 C)  TempSrc:  Oral  Oral  SpO2: 100% 100%  93%  Weight:   77.1 kg   Height:   _0  (1.88 m)    Physical Exam General: WNWD male on 2L [place] holding right side Heart: RRR Lungs: Poor air movement, afraid to take deep breaths  Abdomen: Did not allow exam  Extremities: Trace  Dialysis Access: LUE AVF +bruit   Dialysis Orders:  Person MWF 4h 400/800 74kg 2K/2.25Ca  No heparin  -Hectorol 41mg IV TIW -Venofer 1049mIV x 10 (started 1/3)  -Mircera 225 mcg IV 2 weeks (last 1/3)   Assessment/Plan: 1. CP/Dyspnea - R pleural effusion/empyema on CT, recurrent. Management per primary team  2. ESRD - HD MWF. HD tomorrow on schedule. K 4.2  3. HTN/volume -  BP elevated with volume. Not to EDW. Plan HD tomorrow UF 3-4L as tolerated 4. Anemia -  Hgb 8.3. ESA recently dosed as outpatient. Follow trends  5. MBD - Ca ok. Phos elevated. Continue VDRA/binders/Sensipar  6. Nutrition - Renal diet/vitamins/prostat  7. Chronic pancreatitis/N/V - per primary   [facility] Pager [phone]/11/2018,11:18 AM  LOS: 0 days   Additional Objective Labs: Basic Metabolic Panel: Recent Labs  Lab 07/25/18 1135 07/26/18 0616  NA 135 135  K 4.4 4.2  CL 95* 94*  CO2 27 26  GLUCOSE 88 103*  BUN 25* 31*  CREATININE 9.74* 11.76*  CALCIUM 9.3 9.0  PHOS  --  6.7*   CBC: Recent Labs  Lab 07/25/18 1135 07/26/18 0616  WBC 4.6 5.5  HGB 8.6* 8.3*  HCT 28.3* 26.0*  MCV 80.9 80.7  PLT 251 250   Blood Culture    Component Value Date/Time   SDES PLEURAL RIGHT 02/01/2018 0922   SDES PLEURAL RIGHT 02/01/2018 0922   SPECREQUEST NONE  02/01/2018 0922   SPECREQUEST NONE 02/01/2018 0922   CULT  02/01/2018 0922    NO GROWTH 5 DAYS Performed at [facility] [place] [id number]  REPTSTATUS 02/06/2018 FINAL 02/01/2018 0922   REPTSTATUS 02/02/2018 FINAL 02/01/2018 0922    Cardiac Enzymes: Recent Labs  Lab 07/25/18 2253 07/26/18 0616  TROPONINI <0.03 <0.03   CBG: Recent Labs  Lab 07/26/18 0532 07/26/18 0611  GLUCAP 64* 93   Iron Studies: No results for input(s): IRON, TIBC, TRANSFERRIN, FERRITIN in the last 72 hours. Lab Results  Component Value Date   INR 1.36 04/26/2018   INR 1.56 09/21/2017   INR 1.28 09/04/2017   Medications:  . amLODipine  5 mg Oral Daily  . apixaban  5 mg Oral BID  . busPIRone  5 mg Oral BID  . cinacalcet  90 mg Oral QPM  . hydrALAZINE  100 mg Oral TID  . lanthanum  1,000 mg Oral TID WC  . lipase/protease/amylase  12,000 Units Oral TID AC  . multivitamin  1 tablet Oral QHS  . pantoprazole  40 mg Oral Daily  . zolpidem  5 mg Oral QHS

## 2018-07-27 ENCOUNTER — Encounter (HOSPITAL_COMMUNITY): Payer: Self-pay | Admitting: Physician Assistant

## 2018-07-27 DIAGNOSIS — R079 Chest pain, unspecified: Secondary | ICD-10-CM

## 2018-07-27 DIAGNOSIS — F1190 Opioid use, unspecified, uncomplicated: Secondary | ICD-10-CM

## 2018-07-27 DIAGNOSIS — J948 Other specified pleural conditions: Principal | ICD-10-CM

## 2018-07-27 MED ORDER — ONDANSETRON HCL 4 MG PO TABS
8.0000 mg | ORAL_TABLET | Freq: Three times a day (TID) | ORAL | Status: DC | PRN
  Administered 2018-07-27 – 2018-08-04 (×6): 8 mg via ORAL
  Filled 2018-07-27 (×6): qty 2

## 2018-07-27 MED ORDER — HYDROMORPHONE HCL 1 MG/ML IJ SOLN
1.0000 mg | Freq: Four times a day (QID) | INTRAMUSCULAR | Status: DC | PRN
  Administered 2018-07-27 – 2018-07-28 (×4): 1 mg via INTRAVENOUS
  Filled 2018-07-27 (×4): qty 1

## 2018-07-27 MED ORDER — ENOXAPARIN SODIUM 30 MG/0.3ML ~~LOC~~ SOLN: 30.0000 mg | SUBCUTANEOUS | Status: DC

## 2018-07-27 NOTE — Progress Notes (Signed)
Pt refused dialysis today due to being in too much pain. Rn encouraged pt to get dialysis and pat was persistent about not getting dialysis today. Dialysis RN tried to talk to patient, patient still refused.

## 2018-07-27 NOTE — Progress Notes (Signed)
[name]: [phone]  Patient name: Frank [name] Medical record number: [id number] Date of birth: 14-Dec-1965 Age: 53 y.o. Gender: male  Primary Care Provider: Patient, [name] Consultants: Nephrology  Code Status: Full   Pt Overview and Major Events to Date:  Admitted to [facility] on 07/25/18 1/4: CXR: oculated right sided pleural effusion improved from last xray with possible extrapleural air component at right lung base seen on previous CT 12/2 1/5: CT: pleural effusion and pleural thickening in the R hemithorax with large loculated anterolateral hydropneumothroax in R chest, favoring recurrent empyema and surgery  Assessment and Plan: Frank [name] is a 53 y.o. male presenting with SOB, R-sided chest pain, nausea/vomiting, diarrhea. PMH is significant for right sided empyema with thoracotomy 06/2018, VTE on Eliquis, chronic pancreatitis.   Acute hypoxia without respiratory distress  Recurrent pleural effusions  Empyema- s/p thoracotomy for empyema and decortication of collapsed RLL on 06/2018 and 10 day course of zosyn at [facility], cultures/cytology negative at that time, pathology on tissue benign, Chest tube removed on 12/18. CT significant for residual post-op pleural thickening, right sided pleural effusion, and lateral hydropneumothorax suspected to be residual space 2/2 to inability of lower lobe to reexpand. IR consulted and right pleural pigtail catheter placement scheduled for today, will follow up. CBC and CMP WNL. PAIN MANAGEMENT PER PRIMARY, SEE BELOW.  - Hold Eliquis, cont Lovenox  - Decrease Dilaudid 0.5 mg q6 hours  - DO NOT INCREASE DILAUDID - Consider lidocaine patch post procedure - ambulatory pulse ox - case management consult for home oxygen - PT/OT consult - topical voltaren gel PRN (home med) for musculoskeletal component - Zofran PRN  ESRD (mwf): Nephrology following. H/o non adherence to HD. Home meds:  lanthanum 1,070m TID. Patient refused HD yesterday, is scheduled for today. Appears euvolemic on exam with clear breath sounds. - nephrology following, appreciate recommendations - renal diet - avoid nephrotoxic drugs. - Dilaudid 1-2106mIV PRN prior to HD   H/o Chronic pancreatitis with acute N/V/Diarrea: Resolved Nausea and vomiting overnight, improved this morning. Denies diarrhea or constipation.  Home meds: creon 12000 units TID, multivitamin daily, nutritional supplements. Electrolytes stable.  - Continue to encourage oral hydration - I/O monitoring - monitor for constipation as patient is on opioids - Continue bentyl 1071mID PRN - continue creon TID before meals - continue multivitamin - nutrition consulted  Anemia of chronic disease hgb 8.6>8.3>7,4. MCV 80. Transfusion threshold <7.0. Albumin 2.2, thus could be anemia secondary to poor nutrition.  - continue to monitor - Follow up AM CBC - Will obtain type and screen  - nephrology following, appreciate recommendations   Chronic pain requiring opioids Secondary to thoracotomy and chronic pancreatitis. Ran out of post op pain medications ~07/22/18. [place] substance [place]. Of note, patient has been seen by 8 different physicians within the last 6 months for opioids. Will refrain from giving opioid pain meds at all costs.  - PAIN PER PRIMARY, DO NOT INCREASE DILAUDID DOSE - Wean Dilaudid to 0.5mg75m q6 hours  Depression/anxiety Home meds: alprazolam .5mg 46m PRN, buspar 5mg B43m zolpidem 5mg QH79m continue buspar - continue zolpidem - hold alprazolam (has not been filled since July 2019, may need to remove from med list)  Chronic combined CHF home meds: nitrostat PRN - continue nitrostat PRN  HTN: stable Home meds: amlodipine 5mg dai70m hydralazine 100mg TID55m this AM 144/90.Rerfused meds initially overnight, but agreed at ~0300.  Currently on home meds.  - continue home amlodipine and hydralazine -  monitor BP per floor protocol   Ichthyosis (unknown type)- diffuse cracked skin - moisturizers PRN  FEN/GI: renal diet, fluid restriction, Protonix, Miralax, Senokot Prophylaxis: Lovenox  Disposition: continued inpatient stay   Subjective:  Patient reports increased pain today, requesting more Dilaudid. Otherwise no complaints or concerns. Hesitant for surgery due to worsening pan. Reassured patient. Denies nausea, vomiting, or diarrhea.   Objective: Temp:  [98.4 F (36.9 C)-98.9 F (37.2 C)] 98.9 F (37.2 C) (01/07 1307) Pulse Rate:  [74-81] 81 (01/07 1307) Resp:  [18-20] 18 (01/07 1307) BP: (129-179)/(71-109) 179/109 (01/07 1307) SpO2:  [94 %-100 %] 100 % (01/07 1307) Weight:  [77.5 kg-81.2 kg] 77.5 kg (01/07 1134)  Physical Exam: General: well nourished, well developed, lying on his side in HD bed, in no acute distress with non-toxic appearance HEENT: normocephalic, atraumatic, moist mucous membranes CV: regular rate and rhythm without murmurs, rubs, or gallops, no lower extremity edema Lungs: normal work of breathing, CTAB without wheezes, rales or rhonchi.  Abdomen: soft, non-tender, non-distended, normoactive bowel sounds Skin: dry skin diffusely, large incision scare along right side of chest Extremities: warm and well perfused, normal tone MSK: right sided chest wall extremely tender to touch   Laboratory: Recent Labs  Lab 07/25/18 1135 07/26/18 0616 07/28/18 0443  WBC 4.6 5.5 4.5  HGB 8.6* 8.3* 7.4*  HCT 28.3* 26.0* 24.4*  PLT 251 250 239   Recent Labs  Lab 07/25/18 1135 07/26/18 0616 07/28/18 0443  NA 135 135 135  K 4.4 4.2 4.9  CL 95* 94* 93*  CO2 _0 BUN 25* 31* 46*  CREATININE 9.74* 11.76* 14.59*  CALCIUM 9.3 9.0 8.0*  PROT  --  7.8  --   BILITOT  --  1.1  --   ALKPHOS  --  92  --   ALT  --  6  --   AST  --  19  --   GLUCOSE 88 103* 76     Imaging/Diagnostic Tests: Dg Chest 2 View  Result Date: 07/25/2018 CLINICAL DATA:  Chest  pain. EXAM: CHEST - 2 VIEW COMPARISON:  Most recent chest radiograph 06/22/2018. Lung bases from abdominal CT 06/22/2018 FINDINGS: Decreased right pleural effusion from prior radiograph, partially loculated small volume of pleural fluid remains. Possible partially loculated extrapleural air at the right lung base, not well assessed radiographically. Patchy opacities throughout the right hemithorax. Cardiomegaly is unchanged. Mild interstitial edema suspected. IMPRESSION: 1. Decreased right pleural effusion from prior radiograph, residual partially loculated pleural effusion persists. Possible extrapleural air component at the right lung base, as seen from included lung bases on abdominal CT 06/22/2018. 2. Chronic cardiomegaly with mild interstitial edema. Patchy opacities throughout the right hemithorax may represent asymmetric edema or superimposed infection. Electronically Signed   By: Keith Rake M.D.   On: 07/25/2018 03:56   Ct Chest Wo Contrast  Result Date: 07/26/2018 CLINICAL DATA:  Shortness of breath; history CHF, end-stage renal disease on dialysis, non ischemic cardiomyopathy, hypertension, underwent thoracotomy for empyema in December 2019. EXAM: CT CHEST WITHOUT CONTRAST TECHNIQUE: Multidetector CT imaging of the chest was performed following the standard protocol without IV contrast. IV contrast was not administered since no adequate IV access could be established. Sagittal and coronal MPR images reconstructed from axial data set. COMPARISON:  01/31/2018 CT chest Correlation: CT abdomen and pelvis 06/22/2018 FINDINGS: Cardiovascular: Scattered atherosclerotic calcifications of aorta, coronary arteries and proximal great vessels.  Enlargement of cardiac chambers. Small to moderate sized pericardial effusion slightly increased from previous exam. Upper normal caliber ascending thoracic aorta. Mediastinum/Nodes: Mildly enlarged precarinal lymph node 12 mm short axis image 64. Additional AP window  node 14 mm short axis image 64. Normal sized RIGHT paratracheal and prevascular nodes. Base of cervical region normal appearance. Esophagus unremarkable. Lungs/Pleura: Central peribronchial thickening. Consolidation versus mass RIGHT lower lobe 4.7 x 3.8 cm image 111. Scattered pleural effusion and pleural thickening throughout lateral and posterior RIGHT hemithorax with loculated hydropneumothorax at the anterolateral RIGHT chest, likely representing residual or recurrent empyema in a patient with recent empyema. Basilar portion of RIGHT has been present since the prior CT exam from December 2019, though the cranial extent of this was not previously imaged. Compressive atelectasis in RIGHT upper and RIGHT middle lobes. Upper Abdomen: Atrophic upper kidneys. Probable cyst upper pole LEFT kidney 20 x 20 mm. Remaining visualized upper abdomen unremarkable. Musculoskeletal: Diffuse osseous sclerosis likely renal osteodystrophy in patient with history of renal failure. IMPRESSION: Pleural effusion and pleural thickening in the RIGHT hemithorax with a large loculated anterolateral hydropneumothorax in the RIGHT chest, favor recurrent empyema in patient with recent empyema and surgery; recommend correlation with fluid analysis. At least the basilar portion of the RIGHT hydropneumothorax has been present since December 2012 though full extent has not been previously imaged. Extensive peribronchial thickening with rounded area of opacity in the RIGHT lower lobe approximately 4.7 x 3.8 cm question rounded pneumonia versus neoplasm. Enlargement of cardiac chambers with small to moderate-sized pericardial effusion increased since previous exam. Aortic Atherosclerosis (ICD10-I70.0). Findings called to the patient's nurse Frank [name] on [facility] on 07/26/2018 at 0035 hrs. Electronically Signed   By: Lavonia Dana M.D.   On: 07/26/2018 00:47    Danna Hefty, DO 07/28/2018, 2:57 PM PGY-1, [place] Intern  pager: [phone], text pages welcome
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
## 2018-07-27 NOTE — Progress Notes (Addendum)
[facility] Progress Note   Subjective:   Seen in room at bedside.  No new complaints.  Continues to have L sided pain from surgery.  Breathing ok.    Objective Vitals:   07/26/18 1750 07/26/18 2049 07/27/18 0400 07/27/18 0826  BP: (!) 149/85 (!) 143/85 137/81 138/81  Pulse: 80 91 79 79  Resp:  _0 Temp:  98.6 F (37 C)  98.3 F (36.8 C)  TempSrc:  Oral  Oral  SpO2: 93% 92% (!) 88% 93%  Weight:      Height:       Physical Exam General:NAD, WDWN male, laying on left side Heart:RRR Lungs:mostly CTAB Abdomen:soft, NTND Extremities:no LE edema, dry skin Dialysis Access: LU AVF +bruit/thrill   Filed Weights   07/25/18 2238  Weight: 77.1 kg    Intake/Output Summary (Last 24 hours) at 07/27/2018 1054 Last data filed at 07/27/2018 0500 Gross per 24 hour  Intake 200 ml  Output 150 ml  Net 50 ml    Additional Objective Labs: Basic Metabolic Panel: Recent Labs  Lab 07/25/18 1135 07/26/18 0616  NA 135 135  K 4.4 4.2  CL 95* 94*  CO2 27 26  GLUCOSE 88 103*  BUN 25* 31*  CREATININE 9.74* 11.76*  CALCIUM 9.3 9.0  PHOS  --  6.7*   Liver Function Tests: Recent Labs  Lab 07/26/18 0616  AST 19  ALT 6  ALKPHOS 92  BILITOT 1.1  PROT 7.8  ALBUMIN 2.1*   Recent Labs  Lab 07/25/18 1135  LIPASE 31   CBC: Recent Labs  Lab 07/25/18 1135 07/26/18 0616  WBC 4.6 5.5  HGB 8.6* 8.3*  HCT 28.3* 26.0*  MCV 80.9 80.7  PLT 251 250   Cardiac Enzymes: Recent Labs  Lab 07/25/18 2253 07/26/18 0616  TROPONINI <0.03 <0.03   CBG: Recent Labs  Lab 07/26/18 0532 07/26/18 0611  GLUCAP 64* 93   Iron Studies: No results for input(s): IRON, TIBC, TRANSFERRIN, FERRITIN in the last 72 hours. Lab Results  Component Value Date   INR 1.36 04/26/2018   INR 1.56 09/21/2017   INR 1.28 09/04/2017   Studies/Results: Ct Chest Wo Contrast  Result Date: 07/26/2018 CLINICAL DATA:  Shortness of breath; history CHF, end-stage renal disease on dialysis, non  ischemic cardiomyopathy, hypertension, underwent thoracotomy for empyema in December 2019. EXAM: CT CHEST WITHOUT CONTRAST TECHNIQUE: Multidetector CT imaging of the chest was performed following the standard protocol without IV contrast. IV contrast was not administered since no adequate IV access could be established. Sagittal and coronal MPR images reconstructed from axial data set. COMPARISON:  01/31/2018 CT chest Correlation: CT abdomen and pelvis 06/22/2018 FINDINGS: Cardiovascular: Scattered atherosclerotic calcifications of aorta, coronary arteries and proximal great vessels. Enlargement of cardiac chambers. Small to moderate sized pericardial effusion slightly increased from previous exam. Upper normal caliber ascending thoracic aorta. Mediastinum/Nodes: Mildly enlarged precarinal lymph node 12 mm short axis image 64. Additional AP window node 14 mm short axis image 64. Normal sized RIGHT paratracheal and prevascular nodes. Base of cervical region normal appearance. Esophagus unremarkable. Lungs/Pleura: Central peribronchial thickening. Consolidation versus mass RIGHT lower lobe 4.7 x 3.8 cm image 111. Scattered pleural effusion and pleural thickening throughout lateral and posterior RIGHT hemithorax with loculated hydropneumothorax at the anterolateral RIGHT chest, likely representing residual or recurrent empyema in a patient with recent empyema. Basilar portion of RIGHT has been present since the prior CT exam from December 2019, though the cranial extent of this  was not previously imaged. Compressive atelectasis in RIGHT upper and RIGHT middle lobes. Upper Abdomen: Atrophic upper kidneys. Probable cyst upper pole LEFT kidney 20 x 20 mm. Remaining visualized upper abdomen unremarkable. Musculoskeletal: Diffuse osseous sclerosis likely renal osteodystrophy in patient with history of renal failure. IMPRESSION: Pleural effusion and pleural thickening in the RIGHT hemithorax with a large loculated  anterolateral hydropneumothorax in the RIGHT chest, favor recurrent empyema in patient with recent empyema and surgery; recommend correlation with fluid analysis. At least the basilar portion of the RIGHT hydropneumothorax has been present since December 2012 though full extent has not been previously imaged. Extensive peribronchial thickening with rounded area of opacity in the RIGHT lower lobe approximately 4.7 x 3.8 cm question rounded pneumonia versus neoplasm. Enlargement of cardiac chambers with small to moderate-sized pericardial effusion increased since previous exam. Aortic Atherosclerosis (ICD10-I70.0). Findings called to the patient's nurse Susana RN on [facility] on 07/26/2018 at 0035 hrs. Electronically Signed   By: Lavonia Dana M.D.   On: 07/26/2018 00:47    Medications:  . amLODipine  5 mg Oral Daily  . busPIRone  5 mg Oral BID  . cinacalcet  90 mg Oral QPM  . enoxaparin (LOVENOX) injection  30 mg Subcutaneous Q24H  . hydrALAZINE  100 mg Oral TID  . lanthanum  1,000 mg Oral TID WC  . lipase/protease/amylase  12,000 Units Oral TID AC  . multivitamin  1 tablet Oral QHS  . pantoprazole  40 mg Oral Daily  . zolpidem  5 mg Oral QHS    Dialysis Orders: [facility] MWF 4h 400/800 74kg 2K/2.25Ca  No heparin  -Hectorol 70mg IV TIW -Venofer 104mIV x 10 (started 1/3)  -Mircera 225 mcg IV 2 weeks (last 1/3)   Assessment/Plan: 1. CP/Dyspnea - R pleural effusions/empyema on CT - recurrent, s/p thoracotomy in 06/2018, CTS consulted and recommend chest cath placement by IR to collect sample for culture & suction - per primary  2. ESRD - on HD MWF.  HD scheduled for today per regular schedule with net goal 3-4L. K 4.2 3. Anemia of CKD- Hgb 8.3. ESA dosed as OP on 07/24/18. Follow trends.  4. Secondary hyperparathyroidism - Ca 9.0, Phos 6.7, Continue VDRA/binders/Sensipar. Has been refusing meds. Follow trends.  5. HTN/volume - BP improved.  Does not appear grossly volume overloaded on exam. 3kg over EDW,  plan for volume removal to EDW today.   6. Nutrition - Renal diet w/fluid restrictions, renal vitamins, protein supplements. Alb 2.1 7. Chronic pancreatitis/nausea/vomiting - per primary  LiJen MowPA-C [facility] Pager: [phone] 3064/12/2018,10:54 AM  LOS: 1 day   Pt seen, examined and agree w A/P as above.  Patient refusing HD this afternoon, will reschedule for tomrrow.  RoKelly SplinterD CaNewell Rubbermaidager [phone] 07/27/2018, 3:30 PM

## 2018-07-28 ENCOUNTER — Inpatient Hospital Stay (HOSPITAL_COMMUNITY): Payer: Medicare Other

## 2018-07-28 DIAGNOSIS — F1190 Opioid use, unspecified, uncomplicated: Secondary | ICD-10-CM

## 2018-07-28 LAB — CBC
HCT: 24.4 % — ABNORMAL LOW (ref 39.0–52.0)
Hemoglobin: 7.4 g/dL — ABNORMAL LOW (ref 13.0–17.0)
MCH: 24.3 pg — AB (ref 26.0–34.0)
MCHC: 30.3 g/dL (ref 30.0–36.0)
MCV: 80 fL (ref 80.0–100.0)
Platelets: 239 10*3/uL (ref 150–400)
RBC: 3.05 MIL/uL — ABNORMAL LOW (ref 4.22–5.81)
RDW: 18.4 % — ABNORMAL HIGH (ref 11.5–15.5)
WBC: 4.5 10*3/uL (ref 4.0–10.5)
nRBC: 0 % (ref 0.0–0.2)

## 2018-07-28 LAB — PROTIME-INR
INR: 1.39
PROTHROMBIN TIME: 16.9 s — AB (ref 11.4–15.2)

## 2018-07-28 LAB — RENAL FUNCTION PANEL
ALBUMIN: 2.2 g/dL — AB (ref 3.5–5.0)
Anion gap: 17 — ABNORMAL HIGH (ref 5–15)
BUN: 46 mg/dL — ABNORMAL HIGH (ref 6–20)
CO2: 25 mmol/L (ref 22–32)
Calcium: 8 mg/dL — ABNORMAL LOW (ref 8.9–10.3)
Chloride: 93 mmol/L — ABNORMAL LOW (ref 98–111)
Creatinine, Ser: 14.59 mg/dL — ABNORMAL HIGH (ref 0.61–1.24)
GFR calc Af Amer: 4 mL/min — ABNORMAL LOW (ref >60)
GFR calc non Af Amer: 3 mL/min — ABNORMAL LOW (ref >60)
Glucose, Bld: 76 mg/dL (ref 70–99)
Phosphorus: 6.2 mg/dL — ABNORMAL HIGH (ref 2.5–4.6)
Potassium: 4.9 mmol/L (ref 3.5–5.1)
Sodium: 135 mmol/L (ref 135–145)

## 2018-07-28 LAB — HEPATITIS B SURFACE ANTIGEN: Hepatitis B Surface Ag: NEGATIVE

## 2018-07-28 LAB — GLUCOSE, CAPILLARY
Glucose-Capillary: 75 mg/dL (ref 70–99)
Glucose-Capillary: 106 mg/dL — ABNORMAL HIGH (ref 70–99)

## 2018-07-28 MED ORDER — DEXTROSE 50 % IV SOLN
INTRAVENOUS | Status: AC
  Administered 2018-07-28: 50 mL via INTRAVENOUS
  Filled 2018-07-28: qty 50

## 2018-07-28 MED ORDER — FENTANYL CITRATE (PF) 100 MCG/2ML IJ SOLN
INTRAMUSCULAR | Status: AC | PRN
  Administered 2018-07-28 (×4): 25 ug via INTRAVENOUS

## 2018-07-28 MED ORDER — HYDROMORPHONE HCL 1 MG/ML IJ SOLN
0.7500 mg | INTRAMUSCULAR | Status: DC | PRN
  Administered 2018-07-28 – 2018-07-30 (×11): 0.75 mg via INTRAVENOUS
  Filled 2018-07-28 (×12): qty 1

## 2018-07-28 MED ORDER — APIXABAN 5 MG PO TABS
5.0000 mg | ORAL_TABLET | Freq: Two times a day (BID) | ORAL | Status: DC
  Administered 2018-07-29 – 2018-08-01 (×7): 5 mg via ORAL
  Filled 2018-07-28 (×10): qty 1

## 2018-07-28 MED ORDER — LIDOCAINE-PRILOCAINE 2.5-2.5 % EX CREA: 1.0000 "application " | TOPICAL_CREAM | CUTANEOUS | Status: DC | PRN

## 2018-07-28 MED ORDER — HYDROMORPHONE HCL 1 MG/ML IJ SOLN: 1.0000 mg | INTRAMUSCULAR | Status: AC | PRN

## 2018-07-28 MED ORDER — FENTANYL CITRATE (PF) 100 MCG/2ML IJ SOLN
INTRAMUSCULAR | Status: AC
  Filled 2018-07-28: qty 2

## 2018-07-28 MED ORDER — DEXTROSE 50 % IV SOLN
50.0000 mL | Freq: Once | INTRAVENOUS | Status: AC
  Administered 2018-07-28: 50 mL via INTRAVENOUS

## 2018-07-28 MED ORDER — SODIUM CHLORIDE 0.9 % IV SOLN: 100.0000 mL | INTRAVENOUS | Status: DC | PRN

## 2018-07-28 MED ORDER — HYDROMORPHONE HCL 1 MG/ML IJ SOLN
INTRAMUSCULAR | Status: AC
  Administered 2018-07-28: 0.5 mg via INTRAVENOUS
  Filled 2018-07-28: qty 0.5

## 2018-07-28 MED ORDER — ALTEPLASE 2 MG IJ SOLR: 2.0000 mg | Freq: Once | INTRAMUSCULAR | Status: DC | PRN

## 2018-07-28 MED ORDER — LIDOCAINE HCL (PF) 1 % IJ SOLN: 5.0000 mL | INTRAMUSCULAR | Status: DC | PRN

## 2018-07-28 MED ORDER — HEPARIN SODIUM (PORCINE) 1000 UNIT/ML DIALYSIS: 1000.0000 [IU] | INTRAMUSCULAR | Status: DC | PRN

## 2018-07-28 MED ORDER — MIDAZOLAM HCL 2 MG/2ML IJ SOLN
INTRAMUSCULAR | Status: AC
  Filled 2018-07-28: qty 4

## 2018-07-28 MED ORDER — SODIUM CHLORIDE 0.9% FLUSH
5.0000 mL | Freq: Three times a day (TID) | INTRAVENOUS | Status: DC
  Administered 2018-08-01 – 2018-08-03 (×3): 5 mL

## 2018-07-28 MED ORDER — HYDROMORPHONE HCL 1 MG/ML IJ SOLN: 1.0000 mg | INTRAMUSCULAR | Status: DC | PRN

## 2018-07-28 MED ORDER — HYDROMORPHONE HCL 1 MG/ML IJ SOLN
0.5000 mg | Freq: Four times a day (QID) | INTRAMUSCULAR | Status: DC | PRN
  Administered 2018-07-28 (×2): 0.5 mg via INTRAVENOUS
  Filled 2018-07-28: qty 0.5

## 2018-07-28 MED ORDER — LIDOCAINE HCL 1 % IJ SOLN
INTRAMUSCULAR | Status: AC
  Filled 2018-07-28: qty 20

## 2018-07-28 MED ORDER — MIDAZOLAM HCL 2 MG/2ML IJ SOLN
INTRAMUSCULAR | Status: AC | PRN
  Administered 2018-07-28: 1 mg via INTRAVENOUS
  Administered 2018-07-28: 0.5 mg via INTRAVENOUS

## 2018-07-28 MED ORDER — PENTAFLUOROPROP-TETRAFLUOROETH EX AERO: 1.0000 "application " | INHALATION_SPRAY | CUTANEOUS | Status: DC | PRN

## 2018-07-28 NOTE — Progress Notes (Signed)
[facility] Progress Note   Subjective:  Seen on HD, in lots of pain.  IR consulting for residual ptx.   Objective Vitals:   07/28/18 1030 07/28/18 1100 07/28/18 1134 07/28/18 1307  BP: (!) 163/93 (!) 154/92 (!) 158/88 (!) 179/109  Pulse: 81 78 78 81  Resp:   18 18  Temp:   98.4 F (36.9 C) 98.9 F (37.2 C)  TempSrc:   Oral Oral  SpO2:   96% 100%  Weight:   77.5 kg   Height:       Physical Exam General:NAD, WDWN male, laying on left side Heart:RRR Lungs:mostly CTAB Abdomen:soft, NTND Extremities:no LE edema, dry skin Dialysis Access: LU AVF +bruit/thrill   Dialysis: [facility] MWF   4h 400/800 74kg 2/2.25 bath  Hep none   LUA AVF No heparin  -Hectorol 40mg IV TIW -Venofer 1061mIV x 10 (started 1/3)  -Mircera 225 mcg IV 2 weeks (last 1/3)   Assessment: 1. SP R VATS/ decortication, IR consulting for residual PTX 2. ESRD HD MWF 3. Anemia ckd, esa due 1/10 4. MBD ckd 5. HTN cont meds 6. Chron panc 7. Volume up 2-3 kg  Plan: 1. HD today off sched , then thursday  Rob Janye Maynor MD CaEl Brazilager [phone] 07/28/2018, 1:19 PM   Filed Weights   07/25/18 2238 07/28/18 0730 07/28/18 1134  Weight: 77.1 kg 81.2 kg 77.5 kg    Intake/Output Summary (Last 24 hours) at 07/28/2018 1319 Last data filed at 07/28/2018 1134 Gross per 24 hour  Intake -  Output 552 ml  Net -552 ml    Additional Objective Labs: Basic Metabolic Panel: Recent Labs  Lab 07/25/18 1135 07/26/18 0616 07/28/18 0443  NA 135 135 135  K 4.4 4.2 4.9  CL 95* 94* 93*  CO2 _0 GLUCOSE 88 103* 76  BUN 25* 31* 46*  CREATININE 9.74* 11.76* 14.59*  CALCIUM 9.3 9.0 8.0*  PHOS  --  6.7* 6.2*   Liver Function Tests: Recent Labs  Lab 07/26/18 0616 07/28/18 0443  AST 19  --   ALT 6  --   ALKPHOS 92  --   BILITOT 1.1  --   PROT 7.8  --   ALBUMIN 2.1* 2.2*   Recent Labs  Lab 07/25/18 1135  LIPASE 31   CBC: Recent Labs  Lab 07/25/18 1135  07/26/18 0616 07/28/18 0443  WBC 4.6 5.5 4.5  HGB 8.6* 8.3* 7.4*  HCT 28.3* 26.0* 24.4*  MCV 80.9 80.7 80.0  PLT 251 250 239   Cardiac Enzymes: Recent Labs  Lab 07/25/18 2253 07/26/18 0616  TROPONINI <0.03 <0.03   CBG: Recent Labs  Lab 07/26/18 0532 07/26/18 0611 07/28/18 0908 07/28/18 1018  GLUCAP 64* 93 75 106*   Iron Studies: No results for input(s): IRON, TIBC, TRANSFERRIN, FERRITIN in the last 72 hours. Lab Results  Component Value Date   INR 1.39 07/28/2018   INR 1.36 04/26/2018   INR 1.56 09/21/2017   Studies/Results: No results found.  Medications:  . amLODipine  5 mg Oral Daily  . busPIRone  5 mg Oral BID  . cinacalcet  90 mg Oral QPM  . enoxaparin (LOVENOX) injection  30 mg Subcutaneous Q24H  . hydrALAZINE  100 mg Oral TID  . lanthanum  1,000 mg Oral TID WC  . lipase/protease/amylase  12,000 Units Oral TID AC  . multivitamin  1 tablet Oral QHS  . pantoprazole  40 mg Oral Daily  .  zolpidem  5 mg Oral QHS

## 2018-07-28 NOTE — Progress Notes (Signed)
Initial Nutrition Assessment  DOCUMENTATION CODES:   Not applicable  INTERVENTION:   - Once diet advanced, Nepro Shake po BID, each supplement provides 425 kcal and 19 grams protein  - Once diet advanced, rena-vit daily  NUTRITION DIAGNOSIS:   Increased nutrient needs related to chronic illness (ESRD on HD, CHF, chronic pancreatitis) as evidenced by estimated needs.  GOAL:   Patient will meet greater than or equal to 90% of their needs  MONITOR:   Diet advancement, Labs, Weight trends, I & O's, Skin  REASON FOR ASSESSMENT:   Consult Assessment of nutrition requirement/status  ASSESSMENT:   53 year old male who presented to the ED on 1/4 with chest pain, N/V, and SOB. PMH significant for CHF, ESRD on HD, recent empyema s/p thoracotomy, chronic pancreatitis.  EDW: 74 kg Net UF today: 552 ml Post-HD weight: 77.5 kg  Noted plan for right pigtail chest tube placement today, 1/7.  Weight down 9 lbs after HD, however still above EDW. Will continue to monitor weight trends during admission.  Attempted to speak with pt x 3. However, pt in radiology having right pigtail chest tube placed. No family present in pt's room.  Weight history in chart reviewed. Weight appears to be stable over the last 9-12 months.  Medications reviewed and include: Sensipar, Fosrenol, Creon, Rena-vit, Protonix  Labs reviewed: phosphorus 6.2 (H), hemoglobin 7.4 (L) CBG's: 106, 75  NUTRITION - FOCUSED PHYSICAL EXAM:  Will completed at follow-up. Pt not in room x 3 attempts.  Diet Order:   Diet Order            Diet NPO time specified Except for: Sips with Meds  Diet effective midnight              EDUCATION NEEDS:   Not appropriate for education at this time  Skin:  Skin Assessment: Reviewed RN Assessment  Last BM:  1/4  Height:   Ht Readings from Last 1 Encounters:  07/25/18 _0  (1.88 m)    Weight:   Wt Readings from Last 1 Encounters:  07/28/18 77.5 kg    Ideal  Body Weight:  86.4 kg  BMI:  Body mass index is 21.94 kg/m.  Estimated Nutritional Needs:   Kcal:  2200-2400  Protein:  110-125 grams  Fluid:  UOP + 1000 ml    Gaynell Face, MS, RD, LDN Inpatient Clinical Dietitian Pager: [phone] Weekend/After Hours: [phone]

## 2018-07-28 NOTE — Progress Notes (Addendum)
Patient refusing all scheduled evening medications tonight, along with type and screen lab draw. Writer explained importance of taking the medications, but patient was adamant about only wanting his pain medication. Patient is hostile towards staff at this time.

## 2018-07-28 NOTE — Progress Notes (Signed)
FPTS Interim Progress Note  Paged Interventional Radiology to discuss starting Eliquis. Per Dr. Earleen Newport it is ok to start Eliquis. Have placed order to initiate Eliquis per pharmacy.   Caroline More, DO 07/28/2018, 5:09 PM PGY-2, [facility] pager [phone]
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
## 2018-07-28 NOTE — Procedures (Signed)
Interventional Radiology Procedure Note  Procedure:  CT image guided right chest tube placement, 48F drain.    Findings:  Complex fluid/gas collection of the right pleural space.   Appears mx-loculated.  .  Complications: None  Recommendations:  - To atrium chamber, suction - Do not submerge - Routine drain care   Signed,  Dulcy Fanny. Earleen Newport, DO

## 2018-07-28 NOTE — Progress Notes (Signed)
Pt. Refused to have fluid removal during hemodialysis treatment. Jen Mow PA made aware at bedside.

## 2018-07-29 ENCOUNTER — Inpatient Hospital Stay (HOSPITAL_COMMUNITY): Payer: Medicare Other

## 2018-07-29 DIAGNOSIS — E440 Moderate protein-calorie malnutrition: Secondary | ICD-10-CM

## 2018-07-29 DIAGNOSIS — Z9689 Presence of other specified functional implants: Secondary | ICD-10-CM

## 2018-07-29 HISTORY — DX: Moderate protein-calorie malnutrition: E44.0

## 2018-07-29 LAB — CBC
HCT: 26.3 % — ABNORMAL LOW (ref 39.0–52.0)
Hemoglobin: 7.9 g/dL — ABNORMAL LOW (ref 13.0–17.0)
MCH: 24.2 pg — ABNORMAL LOW (ref 26.0–34.0)
MCHC: 30 g/dL (ref 30.0–36.0)
MCV: 80.4 fL (ref 80.0–100.0)
PLATELETS: 199 10*3/uL (ref 150–400)
RBC: 3.27 MIL/uL — ABNORMAL LOW (ref 4.22–5.81)
RDW: 18.6 % — ABNORMAL HIGH (ref 11.5–15.5)
WBC: 4.3 10*3/uL (ref 4.0–10.5)
nRBC: 0 % (ref 0.0–0.2)

## 2018-07-29 LAB — GRAM STAIN: Gram Stain: NONE SEEN

## 2018-07-29 LAB — GLUCOSE, CAPILLARY: Glucose-Capillary: 85 mg/dL (ref 70–99)

## 2018-07-29 MED ORDER — NEPRO/CARBSTEADY PO LIQD
237.0000 mL | Freq: Two times a day (BID) | ORAL | Status: DC
  Administered 2018-07-29 – 2018-08-05 (×9): 237 mL via ORAL

## 2018-07-29 MED ORDER — CHLORHEXIDINE GLUCONATE CLOTH 2 % EX PADS
6.0000 | MEDICATED_PAD | Freq: Every day | CUTANEOUS | Status: DC
  Administered 2018-07-31: 6 via TOPICAL

## 2018-07-29 NOTE — Progress Notes (Signed)
Pt refusing all evening medications. Only requesting pain medication.

## 2018-07-29 NOTE — Progress Notes (Signed)
Nutrition Follow-up  DOCUMENTATION CODES:   Non-severe (moderate) malnutrition in context of chronic illness  INTERVENTION:   - Nepro Shake po BID, each supplement provides 425 kcal and 19 grams protein  - Continue rena-vit daily  NUTRITION DIAGNOSIS:   Moderate Malnutrition related to chronic illness (ERSD on HD, CHF, chronic pancreatitis) as evidenced by mild fat depletion, moderate fat depletion, mild muscle depletion, moderate muscle depletion.  New diagnosis  GOAL:   Patient will meet greater than or equal to 90% of their needs  Progressing  MONITOR:   PO intake, Supplement acceptance, Weight trends, I & O's, Labs, Skin  REASON FOR ASSESSMENT:   Consult Assessment of nutrition requirement/status  ASSESSMENT:   53 year old male who presented to the ED on 1/4 with chest pain, N/V, and SOB. PMH significant for CHF, ESRD on HD, recent empyema s/p thoracotomy, chronic pancreatitis.  1/7 - s/p right pigtail chest tube placement for hydropneumothorax  EDW: 74 kg Net UF 1/7: 552 ml Post-HD weigh 1/7t: 77.5 kg  Spoke with pt at bedside. Nephrologist and RN in room providing care at time of visit.  Pt initially asks if RD can "get me some Nepro." Pt reports drinking 1 Nepro Shake on his dialysis days (MWF). Pt states that the RD at his dialysis center "makes sure I get my Nepro." RD provided pt with butter pecan Nepro and cup of ice at time of visit and ordered Nepro BID. Pt states, "I love it."  Pt shares that he watches his phosphorus and potassium. However, noted phosphorus to be 6.2 (H). This is down from 6.7 on 07/26/18. Pt reports that he also tries to limit his fluid intake to 1200 cc daily but does not measure it. RD provided education regarding fluid measurement options and encouraged pt to get a 1 L water bottle to use as a reference.  Pt reports that his appetite has been decreased over the last few days due to pain. Pt states, "I eat a little bit then I'm done."  Pt endorses early satiety. RD noted ~50% completed breakfast meal tray at bedside.  Pt shares that he has been on dialysis for 22 years, states, "it's part of my routine now." Pt states that he has been able to keep his weight up "because I eat, I'm greedy." Pt states that he eats 3 meals daily with snacks.  Breakfast: oatmeal, eggs, [place] sausage, grits, and toast Lunch: sandwich Dinner: [place] food  Pt states that he is looking forward to the vegetable lasagna that he ordered for lunch today. RD encouraged PO intake with a focus on protein-rich foods. Pt expresses understanding, states, "I've got to get my protein in."  Medications reviewed and include: Buspar, Sensipar, Fosrenol, Creon 12,000 units TID, rena-vit, Protonix  Labs reviewed: hemoglobin 7.9 (L), phosphorus 6.2 (H)  Chest tube output: 42 ml after placement yesterday  NUTRITION - FOCUSED PHYSICAL EXAM:  Pt declined the majority of NFPE.    Most Recent Value  Orbital Region  Mild depletion  Upper Arm Region  Moderate depletion  Thoracic and Lumbar Region  Unable to assess  Buccal Region  Mild depletion  Temple Region  Mild depletion  Clavicle Bone Region  Unable to assess  Clavicle and Acromion Bone Region  Unable to assess  Scapular Bone Region  Unable to assess  Dorsal Hand  Moderate depletion  Patellar Region  Unable to assess  Anterior Thigh Region  Unable to assess  Posterior Calf Region  Unable to assess  Edema (  RD Assessment)  Unable to assess  Hair  Reviewed  Eyes  Reviewed  Mouth  Reviewed  Skin  Reviewed  Nails  Reviewed       Diet Order:   Diet Order            Diet renal/carb modified with fluid restriction Diet-HS Snack? Nothing; Fluid restriction: 1200 mL Fluid; Room service appropriate? Yes; Fluid consistency: Thin  Diet effective now              EDUCATION NEEDS:   No education needs have been identified at this time  Skin:  Skin Assessment: Reviewed RN Assessment  Last BM:   1/4  Height:   Ht Readings from Last 1 Encounters:  07/25/18 _0  (1.88 m)    Weight:   Wt Readings from Last 1 Encounters:  07/28/18 77.5 kg    Ideal Body Weight:  86.4 kg  BMI:  Body mass index is 21.94 kg/m.  Estimated Nutritional Needs:   Kcal:  2200-2400  Protein:  110-125 grams  Fluid:  UOP + 1000 ml    Gaynell Face, MS, RD, LDN Inpatient Clinical Dietitian Pager: [phone] Weekend/After Hours: [phone]

## 2018-07-29 NOTE — Progress Notes (Signed)
[name]: [phone]  Frank [name] name: Frank [name] Medical record number: [id number] Date of birth: 1965/09/05 Age: 53 y.o. Gender: male  Primary Care Provider: Patient, Frank [name] Consultants: Nephrology  Code Status: Full   Pt Overview and Major Events to Date:  Admitted to [place] on 07/25/18 1/4: CXR: oculated right sided pleural effusion improved from last xray with possible extrapleural air component at right lung base seen on previous CT 12/2 1/5: CT: pleural effusion and pleural thickening in the R hemithorax with large loculated anterolateral hydropneumothroax in R chest, favoring recurrent empyema and surgery 1/7: Right pleural pigtail catheter placed by IR 1/8: CXR small residual right hydropneumothorax  Assessment and Plan: Frank [name] is a 53 y.o. male presenting with SOB, R-sided chest pain, nausea/vomiting, diarrhea. PMH is significant for right sided empyema with thoracotomy 06/2018, VTE on Eliquis, chronic pancreatitis.   Acute hypoxia without respiratory distress  Recurrent pleural effusions  Hydropneumothorax - Improving S/p placement of right pleural pigtail catheter with suction placed on 1/7 for right hemopneumothorax. Frank [name] complained of pain overnight, given lidocaine patch which helped. Chest tube catheter clogged overnight as well, surgery informed and will evaluate this morning. Vitals stable overnight, on 2L O2. Bandage dry without signs of drainage. Fluid culture negative x 12 hours. Frank [name] continuously refused PT/OT, labs, and ambulation with pulse ox. Emphasized importance of these for discharge. - IR following, appreciate recommendations - Will consult CVTS for recs concerning disposition going forward - Routine drain care and suction, monitor for signs of infection - Continue Eliquis  - Transition to PO Oxy 30m 6x/day PRN    - Lidocaine PRN for pain - ambulatory pulse ox to determine need for home O2 -  case management consulted for home oxygen, will place DME home oxygen order pending ambulation with pulse ox - PT/OT consulted - topical voltaren gel PRN (home med) for musculoskeletal component - Zofran PRN  ESRD (mwf): Nephrology following. H/o non adherence to HD. Home meds: lanthanum 1,0074mTID. Scheduled for HD today. Appears euvolemic on exam with clear breath sounds. EDW 74kg, Wt on admission 77kg, Wt today 72.5kg. - nephrology following, appreciate recommendations - renal diet - avoid nephrotoxic drugs - Dilaudid 1-84m87mV PRN prior to HD   H/o Chronic pancreatitis with acute N/V/Diarrea: improving Episode of vomiting overnight, improved with zofran. Denies any current symptoms. Tolerating PO this morning. Denies constipation.  Home meds: creon 12000 units TID, multivitamin daily, nutritional supplements.  - Continue to encourage oral hydration - I/O monitoring - monitor for constipation as Frank [name] is on opioids - Continue bentyl 61m33mD PRN - continue creon TID before meals - continue multivitamin - nutrition consulted - recommend Nepro Shake BID  Anemia of chronic disease: stable stable. hgb range from 7-8. Transfusion threshold <7.0. - continue to monitor - nephrology following, appreciate recommendations  - Will follow-up Hgb if any signs of bleeding  Chronic pain requiring opioids Secondary to thoracotomy and chronic pancreatitis. Ran out of post op pain medications ~07/22/18. [place] controlled substance database reviewed. Of note, Frank [name] has been seen by 8 different physicians within the last 6 months for opioids. Will limit opioid pain meds.  - Transition to PO Oxy 61mg3mday PRN - Of note, [facility] would like to be sole prescriber on opioids for Frank [name] during this admission  Depression/anxiety Home meds: alprazolam .5mg B71mPRN, buspar 5mg BI66mzolpidem 5mg QHS84mcontinue buspar - continue zolpidem - hold alprazolam (has not  been filled since July 2019)  Chronic  combined CHF home meds: nitrostat PRN - continue nitrostat PRN  HTN: stable Home meds: amlodipine 25m daily, hydralazine 1062mTID. BP elevated this AM to 159/96, 2/2 to Frank [name] refused meds overnight. Currently on home meds. - continue home amlodipine and hydralazine - monitor BP [name] floor protocol   Ichthyosis (unknown type)- diffuse cracked skin - moisturizers PRN  FEN/GI: renal diet + Nepro Shake BID, fluid restriction, Protonix, Miralax, Senokot Prophylaxis: Lovenox  Disposition: Pending CVTR recommendation concerning pigtail catheter    Subjective:  Frank [name] comfortable this morning and in good spirits. Some vomiting overnight but improved this morning. Denies currently experiencing any nausea, vomiting, diarrhea, constipation. Some pain overnight but notes the lidocaine has helped. Discussed importance of compliance to medications and procedures. Frank [name] voiced understanding.   Objective: Temp:  [98 F (36.7 C)-99.1 F (37.3 C)] 98 F (36.7 C) (01/08 2138) Pulse Rate:  [80-81] 81 (01/08 2138) Resp:  [18] 18 (01/08 2138) BP: (141-165)/(80-111) 165/111 (01/08 2138) SpO2:  [94 %-100 %] 96 % (01/08 2138)  Physical Exam: General: well nourished, well developed, sitting up in chair at bedside, in Frank acute distress with non-toxic appearance HEENT: normocephalic, atraumatic, moist mucous membranes CV: regular rate and rhythm without murmurs, rubs, or gallops, Frank lower extremity edema Lungs: normal work of breathing, good air movement throughout without wheezes, rales or rhonchi. Improved aeration to lower right lobe of lung compared to days prior Abdomen: soft, non-tender, non-distended, normoactive bowel sounds Skin: dry skin diffusely, bandage along right chest with bright red/clear-yellow drainage in chest tube Extremities: warm and well perfused, normal tone MSK: right sided chest wall extremely tender to touch   Laboratory: Recent Labs  Lab 07/26/18 0616  07/28/18 0443 07/29/18 0404  WBC 5.5 4.5 4.3  HGB 8.3* 7.4* 7.9*  HCT 26.0* 24.4* 26.3*  PLT 250 239 199   Recent Labs  Lab 07/25/18 1135 07/26/18 0616 07/28/18 0443  NA 135 135 135  K 4.4 4.2 4.9  CL 95* 94* 93*  CO2 _0 BUN 25* 31* 46*  CREATININE 9.74* 11.76* 14.59*  CALCIUM 9.3 9.0 8.0*  PROT  --  7.8  --   BILITOT  --  1.1  --   ALKPHOS  --  92  --   ALT  --  6  --   AST  --  19  --   GLUCOSE 88 103* 76     Imaging/Diagnostic Tests: Dg Chest 2 View  Result Date: 07/25/2018 CLINICAL DATA:  Chest pain. EXAM: CHEST - 2 VIEW COMPARISON:  Most recent chest radiograph 06/22/2018. Lung bases from abdominal CT 06/22/2018 FINDINGS: Decreased right pleural effusion from prior radiograph, partially loculated small volume of pleural fluid remains. Possible partially loculated extrapleural air at the right lung base, not well assessed radiographically. Patchy opacities throughout the right hemithorax. Cardiomegaly is unchanged. Mild interstitial edema suspected. IMPRESSION: 1. Decreased right pleural effusion from prior radiograph, residual partially loculated pleural effusion persists. Possible extrapleural air component at the right lung base, as seen from included lung bases on abdominal CT 06/22/2018. 2. Chronic cardiomegaly with mild interstitial edema. Patchy opacities throughout the right hemithorax may represent asymmetric edema or superimposed infection. Electronically Signed   By: MeKeith Rake.D.   On: 07/25/2018 03:56   Ct Chest Wo Contrast  Result Date: 07/26/2018 CLINICAL DATA:  Shortness of breath; history CHF, end-stage renal disease on dialysis, non ischemic cardiomyopathy, hypertension, underwent thoracotomy for empyema in  December 2019. EXAM: CT CHEST WITHOUT CONTRAST TECHNIQUE: Multidetector CT imaging of the chest was performed following the standard protocol without IV contrast. IV contrast was not administered since Frank adequate IV access could be  established. Sagittal and coronal MPR images reconstructed from axial data set. COMPARISON:  01/31/2018 CT chest Correlation: CT abdomen and pelvis 06/22/2018 FINDINGS: Cardiovascular: Scattered atherosclerotic calcifications of aorta, coronary arteries and proximal great vessels. Enlargement of cardiac chambers. Small to moderate sized pericardial effusion slightly increased from previous exam. Upper normal caliber ascending thoracic aorta. Mediastinum/Nodes: Mildly enlarged precarinal lymph node 12 mm short axis image 64. Additional AP window node 14 mm short axis image 64. Normal sized RIGHT paratracheal and prevascular nodes. Base of cervical region normal appearance. Esophagus unremarkable. Lungs/Pleura: Central peribronchial thickening. Consolidation versus mass RIGHT lower lobe 4.7 x 3.8 cm image 111. Scattered pleural effusion and pleural thickening throughout lateral and posterior RIGHT hemithorax with loculated hydropneumothorax at the anterolateral RIGHT chest, likely representing residual or recurrent empyema in a Frank [name] with recent empyema. Basilar portion of RIGHT has been present since the prior CT exam from December 2019, though the cranial extent of this was not previously imaged. Compressive atelectasis in RIGHT upper and RIGHT middle lobes. Upper Abdomen: Atrophic upper kidneys. Probable cyst upper pole LEFT kidney 20 x 20 mm. Remaining visualized upper abdomen unremarkable. Musculoskeletal: Diffuse osseous sclerosis likely renal osteodystrophy in Frank [name] with history of renal failure. IMPRESSION: Pleural effusion and pleural thickening in the RIGHT hemithorax with a large loculated anterolateral hydropneumothorax in the RIGHT chest, favor recurrent empyema in Frank [name] with recent empyema and surgery; recommend correlation with fluid analysis. At least the basilar portion of the RIGHT hydropneumothorax has been present since December 2012 though full extent has not been previously imaged.  Extensive peribronchial thickening with rounded area of opacity in the RIGHT lower lobe approximately 4.7 x 3.8 cm question rounded pneumonia versus neoplasm. Enlargement of cardiac chambers with small to moderate-sized pericardial effusion increased since previous exam. Aortic Atherosclerosis (ICD10-I70.0). Findings called to the Frank [name]'s nurse Susana RN on [facility] on 07/26/2018 at 0035 hrs. Electronically Signed   By: Lavonia Dana M.D.   On: 07/26/2018 00:47   Dg Chest Port 1 View  Result Date: 07/29/2018 CLINICAL DATA:  Chest tube with chest pain EXAM: PORTABLE CHEST 1 VIEW COMPARISON:  07/25/2018 FINDINGS: Right base chest tube. There is small residual right hydropneumothorax. Cardiomegaly with pericardial effusion by CT. Small left pleural effusion. Vascular congestion. IMPRESSION: 1. Stable basal hydropneumothorax on the right. 2. Cardiomegaly and pericardial effusion. Electronically Signed   By: Monte Fantasia M.D.   On: 07/29/2018 07:13   Ct Image Guided Drainage By Percutaneous Catheter  Result Date: 07/28/2018 INDICATION: 53 year old male with a history of prior right-sided decortication and recurrent hydropneumothorax. He has been referred for chest tube placement in attempt to collapse the potential space. EXAM: CT GUIDED RIGHT CHEST TUBE PLACEMENT MEDICATIONS: The Frank [name] is currently admitted to the hospital and receiving intravenous antibiotics. The antibiotics were administered within an appropriate time frame prior to the initiation of the procedure. ANESTHESIA/SEDATION: 1.5 mg IV Versed 75 mcg IV Fentanyl Moderate Sedation Time:  29 minutes The Frank [name] was continuously monitored during the procedure by the interventional radiology nurse under my direct supervision. COMPLICATIONS: None TECHNIQUE: Informed written consent was obtained from the Frank [name] after a thorough discussion of the procedural risks, benefits and alternatives. All questions were addressed. Maximal Sterile Barrier Technique was  utilized including caps, mask, sterile gowns, sterile gloves, sterile  drape, hand hygiene and skin antiseptic. A timeout was performed prior to the initiation of the procedure. PROCEDURE: Frank [name] was positioned left decubitus. Scout CT of the chest performed. The lower right chest was prepped with chlorhexidine in a sterile fashion, and a sterile drape was applied covering the operative field. A sterile gown and sterile gloves were used for the procedure. Local anesthesia was provided with 1% Lidocaine. Using CT guidance, 18 gauge trocar needle was advanced into the complex fluid collection of the lower right chest. Modified Seldinger technique was used to place a 10 [place] drain into the pleural space. Catheter was attached to water seal aspiration chamber and sutured in position. Final images were stored and sent to PACs. Catheter was sutured in position. Frank [name] tolerated procedure well and remained hemodynamically stable throughout. Frank complications were encountered and Frank significant blood loss. FINDINGS: CT images of the chest demonstrate pleural thickening on the right with complex fluid collection and multiple loculations. Ten [place] drain terminates within the costophrenic sulcus within the fluid collection. IMPRESSION: Status post CT-guided right thoracostomy tube placement. Signed, Dulcy Fanny. Dellia Nims, RPVI [facility] Electronically Signed   By: Corrie Mckusick D.O.   On: 07/28/2018 16:25    Danna Hefty, DO 07/29/2018, 10:11 PM PGY-1, [place] Intern pager: [phone], text pages welcome

## 2018-07-29 NOTE — Progress Notes (Signed)
Supervising Physician: Markus Daft  Patient Status: Frank [name] - In-pt  Subjective: S/p (R)pigtail chest drain for recurrent hydroPTX Pt feeling okay, a little sore  Objective: Physical Exam: BP (!) 142/80 (BP Location: Left Arm)   Pulse 80   Temp 98.8 F (37.1 C)   Resp 18   Ht _0  (1.88 m)   Wt 77.5 kg   SpO2 94%   BMI 21.94 kg/m  (R)chest drain intact, site clean. Serous output in pleurevac. No obvious airleak   Current Facility-Administered Medications:  .  amLODipine (NORVASC) tablet 5 mg, 5 mg, Oral, Daily, Anderson, Chelsey L, DO, 5 mg at 07/28/18 1318 .  apixaban (ELIQUIS) tablet 5 mg, 5 mg, Oral, Q12H, Karren Cobble, RPH .  busPIRone (BUSPAR) tablet 5 mg, 5 mg, Oral, BID, Anderson, Chelsey L, DO, 5 mg at 07/27/18 0959 .  cinacalcet (SENSIPAR) tablet 90 mg, 90 mg, Oral, QPM, Anderson, Chelsey L, DO, 90 mg at 07/26/18 1751 .  diclofenac sodium (VOLTAREN) 1 % transdermal gel 2 g, 2 g, Topical, BID PRN, Ouida Sills, Chelsey L, DO .  dicyclomine (BENTYL) capsule 10 mg, 10 mg, Oral, QID PRN, [place] Sills, Chelsey L, DO .  hydrALAZINE (APRESOLINE) tablet 100 mg, 100 mg, Oral, TID, Anderson, Chelsey L, DO, 100 mg at 07/28/18 1318 .  HYDROmorphone (DILAUDID) injection 0.75 mg, 0.75 mg, Intravenous, Q4H PRN, McDiarmid, Blane Ohara, MD, 0.75 mg at 07/29/18 0617 .  lanthanum (FOSRENOL) chewable tablet 1,000 mg, 1,000 mg, Oral, TID WC, Anderson, Chelsey L, DO, 1,000 mg at 07/26/18 1348 .  lipase/protease/amylase (CREON) capsule 12,000 Units, 12,000 Units, Oral, TID Rance Muir, Chelsey L, DO, 12,000 Units at 07/27/18 1232 .  multivitamin (RENA-VIT) tablet 1 tablet, 1 tablet, Oral, QHS, Anderson, Chelsey L, DO, 1 tablet at 07/26/18 2336 .  nitroGLYCERIN (NITROSTAT) SL tablet 0.4 mg, 0.4 mg, Sublingual, Q5 min PRN, Ouida Sills, Chelsey L, DO .  ondansetron (ZOFRAN) tablet 8 mg, 8 mg, Oral, Q8H PRN, Anderson, Chelsey L, DO, 8 mg at 07/28/18 2124 .  pantoprazole (PROTONIX) EC tablet 40 mg,  40 mg, Oral, Daily, Anderson, Chelsey L, DO, 40 mg at 07/27/18 0959 .  polyethylene glycol (MIRALAX / GLYCOLAX) packet 17 g, 17 g, Oral, Daily PRN, Ouida Sills, Chelsey L, DO .  senna-docusate (Senokot-S) tablet 2 tablet, 2 tablet, Oral, QHS PRN, Anderson, Chelsey L, DO .  sodium chloride flush (NS) 0.9 % injection 5 mL, 5 mL, Intracatheter, Q8H, Wagner, Jaime, DO .  zolpidem (AMBIEN) tablet 5 mg, 5 mg, Oral, QHS, Anderson, Chelsey L, DO, 5 mg at 07/28/18 2215  Labs: CBC Recent Labs    07/28/18 0443 07/29/18 0404  WBC 4.5 4.3  HGB 7.4* 7.9*  HCT 24.4* 26.3*  PLT 239 199   BMET Recent Labs    07/28/18 0443  NA 135  K 4.9  CL 93*  CO2 25  GLUCOSE 76  BUN 46*  CREATININE 14.59*  CALCIUM 8.0*   LFT Recent Labs    07/28/18 0443  ALBUMIN 2.2*   PT/INR Recent Labs    07/28/18 0443  LABPROT 16.9*  INR 1.39     Studies/Results: Dg Chest Port 1 View  Result Date: 07/29/2018 CLINICAL DATA:  Chest tube with chest pain EXAM: PORTABLE CHEST 1 VIEW COMPARISON:  07/25/2018 FINDINGS: Right base chest tube. There is small residual right hydropneumothorax. Cardiomegaly with pericardial effusion by CT. Small left pleural effusion. Vascular congestion. IMPRESSION: 1. Stable basal hydropneumothorax on the right. 2. Cardiomegaly and pericardial effusion.  Electronically Signed   By: Monte Fantasia M.D.   On: 07/29/2018 07:13   Ct Image Guided Drainage By Percutaneous Catheter  Result Date: 07/28/2018 INDICATION: 53 year old male with a history of prior right-sided decortication and recurrent hydropneumothorax. He has been referred for chest tube placement in attempt to collapse the potential space. EXAM: CT GUIDED RIGHT CHEST TUBE PLACEMENT MEDICATIONS: The patient is currently admitted to the [name] and receiving intravenous antibiotics. The antibiotics were administered within an appropriate time frame prior to the initiation of the procedure. ANESTHESIA/SEDATION: 1.5 mg IV Versed 75 mcg  IV Fentanyl Moderate Sedation Time:  29 minutes The patient was continuously monitored during the procedure by the interventional radiology nurse under my direct supervision. COMPLICATIONS: None TECHNIQUE: Informed written consent was obtained from the patient after a thorough discussion of the procedural risks, benefits and alternatives. All questions were addressed. Maximal Sterile Barrier Technique was utilized including caps, mask, sterile gowns, sterile gloves, sterile drape, hand hygiene and skin antiseptic. A timeout was performed prior to the initiation of the procedure. PROCEDURE: Patient was positioned left decubitus. Scout CT of the chest performed. The lower right chest was prepped with chlorhexidine in a sterile fashion, and a sterile drape was applied covering the operative field. A sterile gown and sterile gloves were used for the procedure. Local anesthesia was provided with 1% Lidocaine. Using CT guidance, 18 gauge trocar needle was advanced into the complex fluid collection of the lower right chest. Modified Seldinger technique was used to place a 10 [place] drain into the pleural space. Catheter was attached to water seal aspiration chamber and sutured in position. Final images were stored and sent to PACs. Catheter was sutured in position. Patient tolerated procedure well and remained hemodynamically stable throughout. No complications were encountered and no significant blood loss. FINDINGS: CT images of the chest demonstrate pleural thickening on the right with complex fluid collection and multiple loculations. Ten [place] drain terminates within the costophrenic sulcus within the fluid collection. IMPRESSION: Status post CT-guided right thoracostomy tube placement. Signed, Dulcy Fanny. Dellia Nims, RPVI [facility] [place] [name] Electronically Signed   By: Corrie Mckusick D.O.   On: 07/28/2018 16:25    Assessment/Plan: S/p (R)pigtail chest drain for  recurrent hydroPTX CXR stable Continue to monitor output and CXR    LOS: 3 days   I spent a total of 15 minutes in face to face in clinical consultation, greater than 50% of which was counseling/coordinating care for (R)chest tube  Ascencion Dike PA-C 07/29/2018 9:56 AM

## 2018-07-29 NOTE — Progress Notes (Addendum)
[facility] Progress Note   Subjective:   Patient seen and examined at bedside.  Pain mostly well controlled since chest tube insertion.  SOB/CP improving.  Complains of being tired, did not sleep well last night.   Objective Vitals:   07/28/18 1800 07/28/18 1830 07/28/18 2216 07/29/18 0641  BP: (!) 148/86 (!) 155/90 (!) 141/85 (!) 142/80  Pulse:   80 80  Resp:   18 18  Temp:   99.1 F (37.3 C) 98.8 F (37.1 C)  TempSrc:      SpO2:   100% 94%  Weight:      Height:       Physical Exam General:NAD, chronically ill appearing, thin male Heart:RRR, no mrg Lungs:BS decreased, no wheeze or rhonchi, pigtail drain in R chest w/serosangious fluid Abdomen:soft, NTND Extremities:no LE edema Dialysis Access: LU AVF +b/t   Filed Weights   07/25/18 2238 07/28/18 0730 07/28/18 1134  Weight: 77.1 kg 81.2 kg 77.5 kg    Intake/Output Summary (Last 24 hours) at 07/29/2018 1332 Last data filed at 07/29/2018 0700 Gross per 24 hour  Intake -  Output 42 ml  Net -42 ml    Additional Objective Labs: Basic Metabolic Panel: Recent Labs  Lab 07/25/18 1135 07/26/18 0616 07/28/18 0443  NA 135 135 135  K 4.4 4.2 4.9  CL 95* 94* 93*  CO2 _0 GLUCOSE 88 103* 76  BUN 25* 31* 46*  CREATININE 9.74* 11.76* 14.59*  CALCIUM 9.3 9.0 8.0*  PHOS  --  6.7* 6.2*   Liver Function Tests: Recent Labs  Lab 07/26/18 0616 07/28/18 0443  AST 19  --   ALT 6  --   ALKPHOS 92  --   BILITOT 1.1  --   PROT 7.8  --   ALBUMIN 2.1* 2.2*   Recent Labs  Lab 07/25/18 1135  LIPASE 31   CBC: Recent Labs  Lab 07/25/18 1135 07/26/18 0616 07/28/18 0443 07/29/18 0404  WBC 4.6 5.5 4.5 4.3  HGB 8.6* 8.3* 7.4* 7.9*  HCT 28.3* 26.0* 24.4* 26.3*  MCV 80.9 80.7 80.0 80.4  PLT 251 250 239 199   Cardiac Enzymes: Recent Labs  Lab 07/25/18 2253 07/26/18 0616  TROPONINI <0.03 <0.03   CBG: Recent Labs  Lab 07/26/18 0532 07/26/18 0611 07/28/18 0908 07/28/18 1018  GLUCAP 64* 93  75 106*    Lab Results  Component Value Date   INR 1.39 07/28/2018   INR 1.36 04/26/2018   INR 1.56 09/21/2017   Studies/Results: Dg Chest Port 1 View  Result Date: 07/29/2018 CLINICAL DATA:  Chest tube with chest pain EXAM: PORTABLE CHEST 1 VIEW COMPARISON:  07/25/2018 FINDINGS: Right base chest tube. There is small residual right hydropneumothorax. Cardiomegaly with pericardial effusion by CT. Small left pleural effusion. Vascular congestion. IMPRESSION: 1. Stable basal hydropneumothorax on the right. 2. Cardiomegaly and pericardial effusion. Electronically Signed   By: Monte Fantasia M.D.   On: 07/29/2018 07:13   Ct Image Guided Drainage By Percutaneous Catheter  Result Date: 07/28/2018 INDICATION: 53 year old male with a history of prior right-sided decortication and recurrent hydropneumothorax. He has been referred for chest tube placement in attempt to collapse the potential space. EXAM: CT GUIDED RIGHT CHEST TUBE PLACEMENT MEDICATIONS: The patient is currently admitted to the hospital and receiving intravenous antibiotics. The antibiotics were administered within an appropriate time frame prior to the initiation of the procedure. ANESTHESIA/SEDATION: 1.5 mg IV Versed 75 mcg IV Fentanyl Moderate Sedation Time:  29 minutes  The patient was continuously monitored during the procedure by the interventional radiology nurse under my direct supervision. COMPLICATIONS: None TECHNIQUE: Informed written consent was obtained from the patient after a thorough discussion of the procedural risks, benefits and alternatives. All questions were addressed. Maximal Sterile Barrier Technique was utilized including caps, mask, sterile gowns, sterile gloves, sterile drape, hand hygiene and skin antiseptic. A timeout was performed prior to the initiation of the procedure. PROCEDURE: Patient was positioned left decubitus. Scout CT of the chest performed. The lower right chest was prepped with chlorhexidine in a sterile  fashion, and a sterile drape was applied covering the operative field. A sterile gown and sterile gloves were used for the procedure. Local anesthesia was provided with 1% Lidocaine. Using CT guidance, 18 gauge trocar needle was advanced into the complex fluid collection of the lower right chest. Modified Seldinger technique was used to place a 10 [place] drain into the pleural space. Catheter was attached to water seal aspiration chamber and sutured in position. Final images were stored and sent to PACs. Catheter was sutured in position. Patient tolerated procedure well and remained hemodynamically stable throughout. No complications were encountered and no significant blood loss. FINDINGS: CT images of the chest demonstrate pleural thickening on the right with complex fluid collection and multiple loculations. Ten [place] drain terminates within the costophrenic sulcus within the fluid collection. IMPRESSION: Status post CT-guided right thoracostomy tube placement. Signed, Dulcy Fanny. Dellia Nims, RPVI [facility] Electronically Signed   By: Corrie Mckusick D.O.   On: 07/28/2018 16:25    Medications:  . amLODipine  5 mg Oral Daily  . apixaban  5 mg Oral Q12H  . busPIRone  5 mg Oral BID  . cinacalcet  90 mg Oral QPM  . feeding supplement (NEPRO CARB STEADY)  237 mL Oral BID BM  . hydrALAZINE  100 mg Oral TID  . lanthanum  1,000 mg Oral TID WC  . lipase/protease/amylase  12,000 Units Oral TID AC  . multivitamin  1 tablet Oral QHS  . pantoprazole  40 mg Oral Daily  . sodium chloride flush  5 mL Intracatheter Q8H  . zolpidem  5 mg Oral QHS    Dialysis Orders: [place] MWF   4h 400/800 74kg 2/2.25 bath  Hep none   LUA AVF No heparin  -Hectorol 62mg IV TIW -Venofer 1067mIV x 10 (started 1/3)  -Mircera 225 mcg IV 2 weeks (last 1/3)   Assessment/Plan: 1. CP/Dyspnea - R pleural effusions/empyema on CT - recurrent, s/p thoracotomy in 06/2018, R  pigtail drain placed 07/28/18 by IR. CXR this AM showed small R residual hydropneumothorax. Per primary/IR. 2. ESRD - on HD MWF.  HD tomorrow, off schedule, with net UF goal 3L. K 4.9. 3. Anemia of CKD- Hgb 7.9. ESA dosed as OP on 07/24/18. Follow trends. Will transfuse if Hgb<7. 4. Secondary hyperparathyroidism - Ca 8.0, Phos 6.2, Continue VDRA/binders/Sensipar. Has been refusing meds. Follow trends.  5. HTN/volume - BP elevated.  Does not appear grossly volume overloaded on exam. Titrate down volume as tolerated. 6. Nutrition - Renal diet w/fluid restrictions, renal vitamins, protein supplements. Alb 2.1 7. Chronic pancreatitis/nausea/vomiting - per primary   LiJen MowPA-C [facility] Pager: [phone]/02/2019,1:32 PM  LOS: 3 days

## 2018-07-29 NOTE — Progress Notes (Signed)
PT Cancellation Note  Patient Details Name: Frank [name] MRN: [id number] DOB: [birth year]   Cancelled Treatment:    Reason Eval/Treat Not Completed: Patient declined, no reason specified(CHart reviewed, RN consulted. Pt refused PT evaluation at this time. Reports fatigue from dialysis 1DA. Reports tomorrow s/p HD with be better for him. ) Will follow acutely and attempt evaluation again at later date/time.   3:21 PM, 07/29/18 Etta Grandchild, PT, DPT Physical Therapist - [place] [phone] (Pager)  [phone] (Office)      Etta Grandchild 07/29/2018, 3:21 PM

## 2018-07-29 NOTE — Progress Notes (Addendum)
[name]: [phone]  Patient name: Frank [name] Medical record number: [id number] Date of birth: June 02, 1966 Age: 53 y.o. Gender: male  Primary Care Provider: Patient, [name] Consultants: Nephrology  Code Status: Full   Pt Overview and Major Events to Date:  Admitted to [facility] on 07/25/18 1/4: CXR: oculated right sided pleural effusion improved from last xray with possible extrapleural air component at right lung base seen on previous CT 12/2 1/5: CT: pleural effusion and pleural thickening in the R hemithorax with large loculated anterolateral hydropneumothroax in R chest, favoring recurrent empyema and surgery 1/7: Right pleural pigtail catheter placed by IR  Assessment and Plan: Frank [name] is a 53 y.o. male presenting with SOB, R-sided chest pain, nausea/vomiting, diarrhea. PMH is significant for right sided empyema with thoracotomy 06/2018, VTE on Eliquis, chronic pancreatitis.   Acute hypoxia without respiratory distress  Recurrent pleural effusions  Hydropneumothorax - s/p thoracotomy for empyema and decortication of collapsed RLL on 06/2018 and 10 day course of zosyn at [facility] Most recent CT significant for residual post-op pleural thickening, right sided pleural effusion, and lateral hydropneumothorax suspected to be residual space 2/2 to inability of lower lobe to reexpand. Right pleural pigtail catheter with suction placed by IR on 1/7 without complication. Complex fluid/gas collection of right pleural space found during procedure. CXR this AM with small residual right hydropneumothorax. Eliquis restarted s/p procedure. Patient comfortable this morning on current pain management.  Bandage dry without signs of drainage. CBC WNL.  Due to complex opioid history with signs of dependence, will continue pain management as below.  - IR following, appreciate recommendations - Routine drain care and suction, monitor for signs of  infection - Continue Eliquis  - Dilaudid 0.75 mg q4 hours PRN (morphine equivalent to outpatient dose)  - Will consider switching to home PO Oxy 109m tomorrow - Consider lidocaine patch if further pain - ambulatory pulse ox - case management consulted for home oxygen - PT/OT consulted - topical voltaren gel PRN (home med) for musculoskeletal component - Zofran PRN  ESRD (mwf): Nephrology following. H/o non adherence to HD. Home meds: lanthanum 1,0034mTID. Received HD yesterday. Net 0.5L fluid off. EDW 74kg, Wt on admission 77.1, AM Wt 77.5. Appears euvolemic on exam with clear breath sounds. Next HD on Thursday. - nephrology following, appreciate recommendations - renal diet - avoid nephrotoxic drugs - Dilaudid 1-82m58mV PRN prior to HD   H/o Chronic pancreatitis with acute N/V/Diarrea: improving Notes improvement in symptoms; denies any further nausea, vomiting, or diarrhea. Denies constipation.  Home meds: creon 12000 units TID, multivitamin daily, nutritional supplements. Nutrition consulted, recommend Nepro shake PO BID.  - Continue to encourage oral hydration - I/O monitoring - monitor for constipation as patient is on opioids - Continue bentyl 4m54mD PRN - continue creon TID before meals - continue multivitamin - nutrition consulted - recommend Nepro Shake BID  Anemia of chronic disease: stable stable. hgb 8.6>8.3>7.4>7.9. Transfusion threshold <7.0. Patient refused type and screen.  - continue to monitor - nephrology following, appreciate recommendations  - Will follow-up Hgb if any signs of bleeding  Chronic pain requiring opioids Secondary to thoracotomy and chronic pancreatitis. Ran out of post op pain medications ~07/22/18.  controlled substance database reviewed. Of note, patient has been seen by 8 different physicians within the last 6 months for opioids. Will limit opioid pain meds.  - Continue Dilaudid to 0.75mg37mq4 hours PRN, plan to  switch to home Oxy  tomorrow - Of note, [facility] would like to be sole prescriber on opioids for patient during this admission  Depression/anxiety Home meds: alprazolam .7m BID PRN, buspar 517mBID, zolpidem 32m40mHS - continue buspar - continue zolpidem - hold alprazolam (has not been filled since July 2019)  Chronic combined CHF home meds: nitrostat PRN - continue nitrostat PRN  HTN: stable Home meds: amlodipine 32mg24mily, hydralazine 100mg53m. BP elevated this AM to 142/80, likely 2/2 because patient refused meds overnight. Currently on home meds. - continue home amlodipine and hydralazine - monitor BP per floor protocol   Ichthyosis (unknown type)- diffuse cracked skin - moisturizers PRN  FEN/GI: renal diet, fluid restriction, Protonix, Miralax, Senokot Prophylaxis: Lovenox  Disposition: continued inpatient stay   Subjective:  Patient comfortable this morning and in good spirits. Denies any nausea, vomiting, diarrhea, constipation. Improved appetite and his ability for tolerating PO is improving. No concerns or complaints.  Objective: Temp:  [98.1 F (36.7 C)-99.1 F (37.3 C)] 98.8 F (37.1 C) (01/08 0641) Pulse Rate:  [79-86] 80 (01/08 0641) Resp:  [17-23] 18 (01/08 0641) BP: (141-179)/(74-109) 142/80 (01/08 0641) SpO2:  [94 %-100 %] 94 % (01/08 0641)  Physical Exam: General: well nourished, well developed, lying comfortably in bed, in no acute distress with non-toxic appearance HEENT: normocephalic, atraumatic, moist mucous membranes CV: regular rate and rhythm without murmurs, rubs, or gallops, no lower extremity edema Lungs: normal work of breathing, good air movement throughout without wheezes, rales or rhonchi.  Abdomen: soft, non-tender, non-distended, normoactive bowel sounds Skin: dry skin diffusely, bandage along right chest with bright red/clear-yellow drainage Extremities: warm and well perfused, normal tone MSK: right sided chest wall extremely tender to touch    Laboratory: Recent Labs  Lab 07/26/18 0616 07/28/18 0443 07/29/18 0404  WBC 5.5 4.5 4.3  HGB 8.3* 7.4* 7.9*  HCT 26.0* 24.4* 26.3*  PLT 250 239 199   Recent Labs  Lab 07/25/18 1135 07/26/18 0616 07/28/18 0443  NA 135 135 135  K 4.4 4.2 4.9  CL 95* 94* 93*  CO2 _0 BUN 25* 31* 46*  CREATININE 9.74* 11.76* 14.59*  CALCIUM 9.3 9.0 8.0*  PROT  --  7.8  --   BILITOT  --  1.1  --   ALKPHOS  --  92  --   ALT  --  6  --   AST  --  19  --   GLUCOSE 88 103* 76     Imaging/Diagnostic Tests: Dg Chest 2 View  Result Date: 07/25/2018 CLINICAL DATA:  Chest pain. EXAM: CHEST - 2 VIEW COMPARISON:  Most recent chest radiograph 06/22/2018. Lung bases from abdominal CT 06/22/2018 FINDINGS: Decreased right pleural effusion from prior radiograph, partially loculated small volume of pleural fluid remains. Possible partially loculated extrapleural air at the right lung base, not well assessed radiographically. Patchy opacities throughout the right hemithorax. Cardiomegaly is unchanged. Mild interstitial edema suspected. IMPRESSION: 1. Decreased right pleural effusion from prior radiograph, residual partially loculated pleural effusion persists. Possible extrapleural air component at the right lung base, as seen from included lung bases on abdominal CT 06/22/2018. 2. Chronic cardiomegaly with mild interstitial edema. Patchy opacities throughout the right hemithorax may represent asymmetric edema or superimposed infection. Electronically Signed   By: MelanKeith Rake   On: 07/25/2018 03:56   Ct Chest Wo Contrast  Result Date: 07/26/2018 CLINICAL DATA:  Shortness of breath; history CHF, end-stage renal disease on dialysis, non  ischemic cardiomyopathy, hypertension, underwent thoracotomy for empyema in December 2019. EXAM: CT CHEST WITHOUT CONTRAST TECHNIQUE: Multidetector CT imaging of the chest was performed following the standard protocol without IV contrast. IV contrast was not  administered since no adequate IV access could be established. Sagittal and coronal MPR images reconstructed from axial data set. COMPARISON:  01/31/2018 CT chest Correlation: CT abdomen and pelvis 06/22/2018 FINDINGS: Cardiovascular: Scattered atherosclerotic calcifications of aorta, coronary arteries and proximal great vessels. Enlargement of cardiac chambers. Small to moderate sized pericardial effusion slightly increased from previous exam. Upper normal caliber ascending thoracic aorta. Mediastinum/Nodes: Mildly enlarged precarinal lymph node 12 mm short axis image 64. Additional AP window node 14 mm short axis image 64. Normal sized RIGHT paratracheal and prevascular nodes. Base of cervical region normal appearance. Esophagus unremarkable. Lungs/Pleura: Central peribronchial thickening. Consolidation versus mass RIGHT lower lobe 4.7 x 3.8 cm image 111. Scattered pleural effusion and pleural thickening throughout lateral and posterior RIGHT hemithorax with loculated hydropneumothorax at the anterolateral RIGHT chest, likely representing residual or recurrent empyema in a patient with recent empyema. Basilar portion of RIGHT has been present since the prior CT exam from December 2019, though the cranial extent of this was not previously imaged. Compressive atelectasis in RIGHT upper and RIGHT middle lobes. Upper Abdomen: Atrophic upper kidneys. Probable cyst upper pole LEFT kidney 20 x 20 mm. Remaining visualized upper abdomen unremarkable. Musculoskeletal: Diffuse osseous sclerosis likely renal osteodystrophy in patient with history of renal failure. IMPRESSION: Pleural effusion and pleural thickening in the RIGHT hemithorax with a large loculated anterolateral hydropneumothorax in the RIGHT chest, favor recurrent empyema in patient with recent empyema and surgery; recommend correlation with fluid analysis. At least the basilar portion of the RIGHT hydropneumothorax has been present since December 2012 though  full extent has not been previously imaged. Extensive peribronchial thickening with rounded area of opacity in the RIGHT lower lobe approximately 4.7 x 3.8 cm question rounded pneumonia versus neoplasm. Enlargement of cardiac chambers with small to moderate-sized pericardial effusion increased since previous exam. Aortic Atherosclerosis (ICD10-I70.0). Findings called to the patient's nurse Susana RN on [facility] on 07/26/2018 at 0035 hrs. Electronically Signed   By: Lavonia Dana M.D.   On: 07/26/2018 00:47   Dg Chest Port 1 View  Result Date: 07/29/2018 CLINICAL DATA:  Chest tube with chest pain EXAM: PORTABLE CHEST 1 VIEW COMPARISON:  07/25/2018 FINDINGS: Right base chest tube. There is small residual right hydropneumothorax. Cardiomegaly with pericardial effusion by CT. Small left pleural effusion. Vascular congestion. IMPRESSION: 1. Stable basal hydropneumothorax on the right. 2. Cardiomegaly and pericardial effusion. Electronically Signed   By: Monte Fantasia M.D.   On: 07/29/2018 07:13   Ct Image Guided Drainage By Percutaneous Catheter  Result Date: 07/28/2018 INDICATION: 53 year old male with a history of prior right-sided decortication and recurrent hydropneumothorax. He has been referred for chest tube placement in attempt to collapse the potential space. EXAM: CT GUIDED RIGHT CHEST TUBE PLACEMENT MEDICATIONS: The patient is currently admitted to the hospital and receiving intravenous antibiotics. The antibiotics were administered within an appropriate time frame prior to the initiation of the procedure. ANESTHESIA/SEDATION: 1.5 mg IV Versed 75 mcg IV Fentanyl Moderate Sedation Time:  29 minutes The patient was continuously monitored during the procedure by the interventional radiology nurse under my direct supervision. COMPLICATIONS: None TECHNIQUE: Informed written consent was obtained from the patient after a thorough discussion of the procedural risks, benefits and alternatives. All questions were  addressed. Maximal Sterile Barrier Technique was utilized  including caps, mask, sterile gowns, sterile gloves, sterile drape, hand hygiene and skin antiseptic. A timeout was performed prior to the initiation of the procedure. PROCEDURE: Patient was positioned left decubitus. Scout CT of the chest performed. The lower right chest was prepped with chlorhexidine in a sterile fashion, and a sterile drape was applied covering the operative field. A sterile gown and sterile gloves were used for the procedure. Local anesthesia was provided with 1% Lidocaine. Using CT guidance, 18 gauge trocar needle was advanced into the complex fluid collection of the lower right chest. Modified Seldinger technique was used to place a 10 [place] drain into the pleural space. Catheter was attached to water seal aspiration chamber and sutured in position. Final images were stored and sent to PACs. Catheter was sutured in position. Patient tolerated procedure well and remained hemodynamically stable throughout. No complications were encountered and no significant blood loss. FINDINGS: CT images of the chest demonstrate pleural thickening on the right with complex fluid collection and multiple loculations. Ten [place] drain terminates within the costophrenic sulcus within the fluid collection. IMPRESSION: Status post CT-guided right thoracostomy tube placement. Signed, Dulcy Fanny. Dellia Nims, RPVI [facility] Electronically Signed   By: Corrie Mckusick D.O.   On: 07/28/2018 16:25    Danna Hefty, DO 07/29/2018, 12:25 PM PGY-1, [facility] Intern pager: [phone], text pages welcome

## 2018-07-29 NOTE — Progress Notes (Addendum)
Writer spoke with PA Luther Parody about inability to flush chest tube catheter. MD said to hold flushes and she will come in the morning to see the patient. Pt is in no distress and oxygen sat is 100% on 3L [place]. Atrium is intact with no air leaks. Pt is refusing continuous pulse ox at this time.

## 2018-07-29 NOTE — Progress Notes (Signed)
Writer explained to pt the importance of getting out of bed and walking. Told pt we needed to get a walking pulse ox. Pt refusing to get out of bed. Will try again this evening.

## 2018-07-30 ENCOUNTER — Inpatient Hospital Stay (HOSPITAL_COMMUNITY): Payer: Medicare Other

## 2018-07-30 DIAGNOSIS — J9383 Other pneumothorax: Secondary | ICD-10-CM

## 2018-07-30 LAB — CBC
HCT: 27.5 % — ABNORMAL LOW (ref 39.0–52.0)
Hemoglobin: 8.4 g/dL — ABNORMAL LOW (ref 13.0–17.0)
MCH: 25 pg — ABNORMAL LOW (ref 26.0–34.0)
MCHC: 30.5 g/dL (ref 30.0–36.0)
MCV: 81.8 fL (ref 80.0–100.0)
Platelets: 187 10*3/uL (ref 150–400)
RBC: 3.36 MIL/uL — ABNORMAL LOW (ref 4.22–5.81)
RDW: 18.6 % — ABNORMAL HIGH (ref 11.5–15.5)
WBC: 1.4 10*3/uL — CL (ref 4.0–10.5)
nRBC: 0 % (ref 0.0–0.2)

## 2018-07-30 LAB — RENAL FUNCTION PANEL
ANION GAP: 11 (ref 5–15)
Albumin: 2.1 g/dL — ABNORMAL LOW (ref 3.5–5.0)
BUN: 26 mg/dL — ABNORMAL HIGH (ref 6–20)
CHLORIDE: 97 mmol/L — AB (ref 98–111)
CO2: 26 mmol/L (ref 22–32)
Calcium: 7.8 mg/dL — ABNORMAL LOW (ref 8.9–10.3)
Creatinine, Ser: 10.49 mg/dL — ABNORMAL HIGH (ref 0.61–1.24)
GFR calc Af Amer: 6 mL/min — ABNORMAL LOW (ref >60)
GFR calc non Af Amer: 5 mL/min — ABNORMAL LOW (ref >60)
Glucose, Bld: 117 mg/dL — ABNORMAL HIGH (ref 70–99)
Phosphorus: 4.3 mg/dL (ref 2.5–4.6)
Potassium: 4.5 mmol/L (ref 3.5–5.1)
Sodium: 134 mmol/L — ABNORMAL LOW (ref 135–145)

## 2018-07-30 LAB — MRSA PCR SCREENING

## 2018-07-30 MED ORDER — LIDOCAINE 5 % EX PTCH
1.0000 | MEDICATED_PATCH | Freq: Once | CUTANEOUS | Status: AC | PRN
  Administered 2018-07-30: 1 via TRANSDERMAL
  Filled 2018-07-30: qty 1

## 2018-07-30 MED ORDER — OXYCODONE HCL 5 MG PO TABS
10.0000 mg | ORAL_TABLET | ORAL | Status: DC | PRN
  Administered 2018-07-30: 5 mg via ORAL
  Administered 2018-07-31: 10 mg via ORAL
  Administered 2018-07-31: 5 mg via ORAL
  Administered 2018-07-31 – 2018-08-04 (×14): 10 mg via ORAL
  Filled 2018-07-30 (×20): qty 2

## 2018-07-30 MED ORDER — HYDROMORPHONE HCL 1 MG/ML IJ SOLN
1.0000 mg | INTRAMUSCULAR | Status: AC | PRN
  Administered 2018-07-30: 2 mg via INTRAVENOUS

## 2018-07-30 MED ORDER — HYDROMORPHONE HCL 1 MG/ML IJ SOLN
INTRAMUSCULAR | Status: AC
  Filled 2018-07-30: qty 2

## 2018-07-30 MED ORDER — OXYCODONE HCL 5 MG PO TABS
10.0000 mg | ORAL_TABLET | Freq: Every day | ORAL | Status: DC
  Administered 2018-07-30: 10 mg via ORAL
  Filled 2018-07-30: qty 2

## 2018-07-30 NOTE — Progress Notes (Signed)
As of the writing of this note, the patient has refused all evening assessments and medications other than a pain assessment and pain medication.  Will continue to monitor and assess as the patient allows.

## 2018-07-30 NOTE — Progress Notes (Addendum)
Frank [name] Progress Note   Subjective:   Seen and examined at bedside.  Reports increased chest wall pain and muscle spasms.  Refuses for me to examine his chest right now, "Maybe later".  Reports SOB, unable to take a deep breath due to pain. No other complaints.   Objective Vitals:   07/29/18 0641 07/29/18 1630 07/29/18 2138 07/30/18 0604  BP: (!) 142/80  (!) 165/111 (!) 159/96  Pulse: 80  81 76  Resp: _0 Temp: 98.8 F (37.1 C)  98 F (36.7 C) 98 F (36.7 C)  TempSrc:      SpO2: 94% 100% 96% 98%  Weight:    72.5 kg  Height:       Physical Exam General:NAD, chronically ill appearing male, laying in bed Heart:unable to examine, R pigtail drain in chest w/serosangious fluid Lungs:unable to auscultate, normal WOB on 2L via [place] Extremities:trace LE edema Dialysis Access: LU AVF    [facility] Weights   07/28/18 0730 07/28/18 1134 07/30/18 0604  Weight: 81.2 kg 77.5 kg 72.5 kg   No intake or output data in the 24 hours ending 07/30/18 1024  Additional Objective Labs: Basic Metabolic Panel: Recent Labs  Lab 07/25/18 1135 07/26/18 0616 07/28/18 0443  NA 135 135 135  K 4.4 4.2 4.9  CL 95* 94* 93*  CO2 _1 GLUCOSE 88 103* 76  BUN 25* 31* 46*  CREATININE 9.74* 11.76* 14.59*  CALCIUM 9.3 9.0 8.0*  PHOS  --  6.7* 6.2*   Liver Function Tests: Recent Labs  Lab 07/26/18 0616 07/28/18 0443  AST 19  --   ALT 6  --   ALKPHOS 92  --   BILITOT 1.1  --   PROT 7.8  --   ALBUMIN 2.1* 2.2*   Recent Labs  Lab 07/25/18 1135  LIPASE 31   CBC: Recent Labs  Lab 07/25/18 1135 07/26/18 0616 07/28/18 0443 07/29/18 0404  WBC 4.6 5.5 4.5 4.3  HGB 8.6* 8.3* 7.4* 7.9*  HCT 28.3* 26.0* 24.4* 26.3*  MCV 80.9 80.7 80.0 80.4  PLT 251 250 239 199   Blood Culture    Component Value Date/Time   SDES PLEURAL 07/29/2018 1300   SDES PLEURAL 07/29/2018 1300   SPECREQUEST NONE 07/29/2018 1300   SPECREQUEST NONE 07/29/2018 1300   CULT  07/29/2018 1300     NO GROWTH < 24 HOURS Performed at [facility], [address] [place], [place] [address]    REPTSTATUS PENDING 07/29/2018 1300   REPTSTATUS 07/29/2018 FINAL 07/29/2018 1300    Cardiac Enzymes: Recent Labs  Lab 07/25/18 2253 07/26/18 0616  TROPONINI <0.03 <0.03   CBG: Recent Labs  Lab 07/26/18 0532 07/26/18 0611 07/28/18 0908 07/28/18 1018 07/29/18 2350  GLUCAP 64* 93 75 106* 85    Lab Results  Component Value Date   INR 1.39 07/28/2018   INR 1.36 04/26/2018   INR 1.56 09/21/2017   Studies/Results: Dg Chest Port 1 View  Result Date: 07/29/2018 CLINICAL DATA:  Chest tube with chest pain EXAM: PORTABLE CHEST 1 VIEW COMPARISON:  07/25/2018 FINDINGS: Right base chest tube. There is small residual right hydropneumothorax. Cardiomegaly with pericardial effusion by CT. Small left pleural effusion. Vascular congestion. IMPRESSION: 1. Stable basal hydropneumothorax on the right. 2. Cardiomegaly and pericardial effusion. Electronically Signed   By: Monte Fantasia M.D.   On: 07/29/2018 07:13   Ct Image Guided Drainage By Percutaneous Catheter  Result Date: 07/28/2018 INDICATION: 53 year old  male with a history of prior right-sided decortication and recurrent hydropneumothorax. He has been referred for chest tube placement in attempt to collapse the potential space. EXAM: CT GUIDED RIGHT CHEST TUBE PLACEMENT MEDICATIONS: The patient is currently admitted to the hospital and receiving intravenous antibiotics. The antibiotics were administered within an appropriate time frame prior to the initiation of the procedure. ANESTHESIA/SEDATION: 1.5 mg IV Versed 75 mcg IV Fentanyl Moderate Sedation Time:  29 minutes The patient was continuously monitored during the procedure by the interventional radiology nurse under my direct supervision. COMPLICATIONS: None TECHNIQUE: Informed written consent was obtained from the patient after a thorough discussion of the procedural risks, benefits and  alternatives. All questions were addressed. Maximal Sterile Barrier Technique was utilized including caps, mask, sterile gowns, sterile gloves, sterile drape, hand hygiene and skin antiseptic. A timeout was performed prior to the initiation of the procedure. PROCEDURE: Patient was positioned left decubitus. Scout CT of the chest performed. The lower right chest was prepped with chlorhexidine in a sterile fashion, and a sterile drape was applied covering the operative field. A sterile gown and sterile gloves were used for the procedure. Local anesthesia was provided with 1% Lidocaine. Using CT guidance, 18 gauge trocar needle was advanced into the complex fluid collection of the lower right chest. Modified Seldinger technique was used to place a 10 [place] drain into the pleural space. Catheter was attached to water seal aspiration chamber and sutured in position. Final images were stored and sent to PACs. Catheter was sutured in position. Patient tolerated procedure well and remained hemodynamically stable throughout. No complications were encountered and no significant blood loss. FINDINGS: CT images of the chest demonstrate pleural thickening on the right with complex fluid collection and multiple loculations. Ten [place] drain terminates within the costophrenic sulcus within the fluid collection. IMPRESSION: Status post CT-guided right thoracostomy tube placement. Signed, Dulcy Fanny. Dellia Nims, RPVI [facility] Radiology Electronically Signed   By: Corrie Mckusick D.O.   On: 07/28/2018 16:25    Medications:  . amLODipine  5 mg Oral Daily  . apixaban  5 mg Oral Q12H  . busPIRone  5 mg Oral BID  . Chlorhexidine Gluconate Cloth  6 each Topical Q0600  . cinacalcet  90 mg Oral QPM  . feeding supplement (NEPRO CARB STEADY)  237 mL Oral BID BM  . hydrALAZINE  100 mg Oral TID  . lanthanum  1,000 mg Oral TID WC  . lipase/protease/amylase  12,000 Units Oral TID AC  .  multivitamin  1 tablet Oral QHS  . pantoprazole  40 mg Oral Daily  . sodium chloride flush  5 mL Intracatheter Q8H  . zolpidem  5 mg Oral QHS    Dialysis Orders: [facility] MWF  4h 400/800 74kg 2/2.25 bath Hep none LUA AVF No heparin  -Hectorol 55mg IV TIW -Venofer 101mIV x 10 (started 1/3)  -Mircera 225 mcg IV 2 weeks (last 1/3)   Assessment/Plan: 1.CP/Dyspnea - R recurrent pleural effusion: [facility], now sp R pigtail drain placed 07/28/18 by IR. Cultures NGTD. CXR 1/8 showed small R residual hydropneumothorax.  Per primary/IR. 2. ESRD -on HD MWF. HD today, off schedule, with net UF goal 3L.  Next HD on Sat. Will return to regular schedule next week. K 4.9. 3. Anemia of CKD-Hgb 7.9. ESA dosed as OP on 07/24/18. Follow trends.Will transfuse if Hgb<7.  Repeat labs pre  HD today. 4. Secondary hyperparathyroidism -Ca 8.0, Phos 6.2, Continue VDRA/binders/Sensipar. Has been refusing meds/labs.  Repeat labs pre HD today. Follow trends. 5. HTN/volume -BP elevated. Does not appear grossly volume overloaded on exam. Titrate down volume as tolerated. 6. Nutrition -Renal diet w/fluid restrictions, renal vitamins, protein supplements. Alb 2.1 7. Chronic pancreatitis/nausea/vomiting - per primary   Jen Mow, PA-C [name] Pager: [phone] 07/30/2018,10:24 AM  LOS: 4 days   Pt seen, examined and agree w A/P as above.  Kelly Splinter MD [employer] pager [phone]   07/30/2018, 11:28 AM

## 2018-07-30 NOTE — Care Management Important Message (Signed)
Important Message  Patient Details  Name: Frank [name] MRN: [id number] Date of Birth: March 31, 1966   Medicare Important Message Given:  Yes  Patient requested that a unsigned copy be left.   Barbera Perritt 07/30/2018, 2:50 PM

## 2018-07-30 NOTE — Progress Notes (Signed)
Received critical lab value of WBC 1.4??, nephrologist made aware with no new ordeers at this time.

## 2018-07-30 NOTE — Progress Notes (Signed)
Consult received per NCM for home oxygen. No walking O2 sat. Qualifier noted in chart and no DME home oxygen ordered per MD for NCM to make referral. NCM will f/u with bedside nurse.Whitman Hero RN,BSN,CM

## 2018-07-30 NOTE — Progress Notes (Addendum)
[address]       [place] [address]             [phone]         Subjective: Feels okay. On HD.  Objective: Vital signs in last 24 hours: Temp:  [98 F (36.7 C)] 98 F (36.7 C) (01/09 1340) Pulse Rate:  [71-81] 71 (01/09 1405) Resp:  [15-18] 16 (01/09 1405) BP: (159-192)/(55-111) 165/62 (01/09 1405) SpO2:  [95 %-100 %] 98 % (01/09 1405) Weight:  [72.4 kg-72.5 kg] 72.4 kg (01/09 1340)     Intake/Output from previous day: No intake/output data recorded. Intake/Output this shift: No intake/output data recorded.  General appearance: alert, cooperative and no distress Heart: regular rate and rhythm, S1, S2 normal, no murmur, click, rub or gallop Lungs: clear to auscultation bilaterally Abdomen: soft, non-tender; bowel sounds normal; no masses,  no organomegaly Extremities: extremities normal, atraumatic, no cyanosis or edema Wound: pleural pigtial in good postition  Lab Results: Recent Labs    07/28/18 0443 07/29/18 0404  WBC 4.5 4.3  HGB 7.4* 7.9*  HCT 24.4* 26.3*  PLT 239 199   BMET:  Recent Labs    07/28/18 0443  NA 135  K 4.9  CL 93*  CO2 25  GLUCOSE 76  BUN 46*  CREATININE 14.59*  CALCIUM 8.0*    PT/INR:  Recent Labs    07/28/18 0443  LABPROT 16.9*  INR 1.39   ABG    Component Value Date/Time   HCO3 19.3 (L) 12/31/2014 0801   TCO2 31 06/15/2018 2129   ACIDBASEDEF 7.0 (H) 12/31/2014 0801   O2SAT 45.0 12/31/2014 0801   CBG (last 3)  Recent Labs    07/28/18 0908 07/28/18 1018 07/29/18 2350  GLUCAP 75 106* 85    Assessment/Plan: S/P pigtail catheter placement  1. Hydropneumothorax- s/p pigtail catheter placement by IR. Continue on suction. CXR 1/9 showed: Stable cardiomegaly. Stable position of right-sided pigtail drainage catheter in right lung base with stable right basilar hydropneumothorax. Stable left basilar opacity as described above. Chest tube output 67m 2. Cultures remain negative at this time.  Will continue to follow. If positive may need surgical intervention.  3. Tmax 99.1, last WBC 4.3 4. Hypertension-care per primary 5. ESRD-on HD MWF, followed by Nephrology  Plan: Keep chest tube on suction for one more day and then will switch to water seal tomorrow if CXR remains stable. Small amount of output from the chest tube and drainage appears clear.      LOS: 4 days    TElgie Collard1/03/2019  The right lateral pneumothorax has improved following placement of the pigtail catheter We will plan on 48 hours of suction and then waterseal tomorrow Pleural fluid Gram stain is negative for organisms and no growth to date The hydropneumothorax in the right pleural space is probably secondary to an entrapped lung with inability to re-expand and fill the entire pleural space [facility]  PDahlia ByesMD

## 2018-07-30 NOTE — Progress Notes (Addendum)
[name]: [phone]  Patient name: Frank [name] Medical record number: [id number] Date of birth: [birth year] Age: 53 y.o. Gender: male  Primary Care Provider: Patient, [name] Consultants: Nephrology  Code Status: Full   Pt Overview and Major Events to Date:  Admitted to [facility] on 07/25/18 1/4: CXR: oculated right sided pleural effusion improved from last xray with possible extrapleural air component at right lung base seen on previous CT 12/2 1/5: CT: pleural effusion and pleural thickening in the R hemithorax with large loculated anterolateral hydropneumothroax in R chest, favoring recurrent empyema and surgery 1/7: Right pleural pigtail catheter placed by IR 1/8: CXR small residual right hydropneumothorax  Assessment and Plan: [name] is a 53 y.o. male presenting with SOB, R-sided chest pain, nausea/vomiting, diarrhea. PMH is significant for right sided empyema with thoracotomy 06/2018, VTE on Eliquis, chronic pancreatitis.   Neutropenia:  Critical WBC of 1.4 yesterday. All other cell lines stable. WBC improved to 6.0 today.  - Will continue to monitor - AM CBC  Acute hypoxia without respiratory distress  Recurrent pleural effusions  Hydropneumothorax - Improving Stable after placement of right pleural pigtail catheter with suction placed on 1/7 for right hemopneumothorax. Repeat CXR significant for stable right basilar hydropneumothorax. Fluid culture negative x 2 days. Chest tube placed n water seal this AM without any signs of leak or drainage. Bandage clean and intact. Repeat CXR ordered for tomorrow per CVTS. Pain controlled with home dose PO Oxy IR 96m.  Notes some pain this AM but plans to try Lidocaine patch. Vitals stable overnight, on 2L O2. Emphasized dimportance of pulse ox with O2 for discharge. Patient notes he will be compliant. IR managing chest tube. - Follow up repeat CXR - CVTS following, appreciate recs  -  IR following, appreciate recommendations - Follow up fluid cultures - Continue Eliquis  - Continue PO Oxy 159mq4 hours PRN    - Lidocaine PRN for pain - ambulatory pulse ox to determine need for home O2 - case management consulted for home oxygen, will place DME home oxygen order pending ambulation with pulse ox - PT/OT consulted - topical voltaren gel PRN (home med) for musculoskeletal component - Zofran PRN  ESRD (mwf): Nephrology following. H/o non adherence to HD. Home meds: lanthanum 1,00082mID. Received HD yesterday. Next HD on Saturday with return to regular schedule next week. Appears euvolemic on exam with clear breath sounds. EDW 74kg, Wt on admission 77kg, Wt today 72.8kg.  - Defer treatment to Nephro, will follow recommendations - renal diet - avoid nephrotoxic drugs - Dilaudid 1-2mg57m PRN prior to HD   H/o Chronic pancreatitis with acute N/V/Diarrea: improving Denies any current symptoms. Tolerating PO this morning. Denies constipation.  Home meds: creon 12000 units TID, multivitamin daily, nutritional supplements.  - Continue to encourage oral hydration - I/O monitoring - monitor for constipation as patient is on opioids - Continue bentyl 10mg67m PRN - continue creon TID before meals - continue multivitamin - nutrition consulted - recommend Nepro Shake BID  Anemia of chronic disease: stable stable. hgb range from 7-8. Transfusion threshold <7.0. - continue to monitor - nephrology following, appreciate recommendations  - Will follow-up Hgb if any signs of bleeding  Chronic pain requiring opioids Secondary to thoracotomy and chronic pancreatitis. [place] controlled substance database reviewed. Of note, patient has been seen by 8 different physicians within the last 6 months for opioids. Will limit opioid pain meds.  -  Continue PO Oxy 32m 6x/day PRN - Of note, FMTS would like to be sole prescriber on opioids for patient during this  admission  Depression/anxiety Home meds: alprazolam .529mBID PRN, buspar 6m46mID, zolpidem 6mg39mS - continue buspar - continue zolpidem - hold alprazolam (has not been filled since July 2019)  Chronic combined CHF home meds: nitrostat PRN - continue nitrostat PRN  HTN: stable Home meds: amlodipine 6mg 47mly, hydralazine 100mg 57m BP this AM 133/79. Currently on home meds. - continue home amlodipine and hydralazine - monitor BP per floor protocol   Ichthyosis (unknown type)- diffuse cracked skin - moisturizers PRN  FEN/GI: renal diet + Nepro Shake BID, fluid restriction, Protonix, Miralax, Senokot Prophylaxis: Lovenox  Disposition: Pending CVTR recommendation concerning pigtail catheter    Subjective:  Patient notes some pain this morning. Has not tried Lidocaine patch, is opening to trying this again. Seems tired this morning on exam. Denies any nausea or vomiting, constipation or diarrhea.   Objective: Temp:  [97.6 F (36.4 C)-98 F (36.7 C)] 97.6 F (36.4 C) (01/10 0536) Pulse Rate:  [70-93] 70 (01/10 0536) Resp:  [12-18] 16 (01/10 0536) BP: (133-192)/(55-90) 150/86 (01/10 0536) SpO2:  [95 %-100 %] 100 % (01/10 0536) Weight:  [70.4 kg-72.8 kg] 72.8 kg (01/10 0536)  Physical Exam: General: well nourished, well developed, sitting at side of bed, in no acute distress with non-toxic appearance HEENT: normocephalic, atraumatic, moist mucous membranes CV: regular rate and rhythm without murmurs, rubs, or gallops, no lower extremity edema Lungs: normal work of breathing, good air movement throughout without wheezes, rales or rhonchi. Improved aeration to lower right lobe of lung compared to days prior Abdomen: soft, non-tender, non-distended, normoactive bowel sounds Skin: dry skin diffusely, bandage along right chest, clean and intact without signs of drainage, no drainage in tube, scar along right chest Extremities: warm and well perfused, normal tone MSK: right  sided chest wall tender to touch, unchanged from day prior  Laboratory: Recent Labs  Lab 07/29/18 0404 07/30/18 1204 07/31/18 0633  WBC 4.3 1.4* 6.0  HGB 7.9* 8.4* 8.6*  HCT 26.3* 27.5* 27.9*  PLT 199 187 173   Recent Labs  Lab 07/26/18 0616 07/28/18 0443 07/30/18 1204  NA 135 135 134*  K 4.2 4.9 4.5  CL 94* 93* 97*  CO2 _0 BUN 31* 46* 26*  CREATININE 11.76* 14.59* 10.49*  CALCIUM 9.0 8.0* 7.8*  PROT 7.8  --   --   BILITOT 1.1  --   --   ALKPHOS 92  --   --   ALT 6  --   --   AST 19  --   --   GLUCOSE 103* 76 117*     Imaging/Diagnostic Tests: Dg Chest 1 View  Result Date: 07/30/2018 CLINICAL DATA:  Hydropneumothorax. EXAM: CHEST  1 VIEW COMPARISON:  Radiograph of July 29, 2018. FINDINGS: Stable cardiomegaly. No pneumothorax is noted. Stable left basilar opacity is noted concerning for atelectasis or infiltrate with associated pleural effusion. Stable position of right-sided pigtail drainage catheter in right lung base. Stable right basilar hydropneumothorax is noted. Bony thorax is unremarkable. IMPRESSION: Stable cardiomegaly. Stable position of right-sided pigtail drainage catheter in right lung base with stable right basilar hydropneumothorax. Stable left basilar opacity as described above. Electronically Signed   By: James Marijo Conception   On: 07/30/2018 13:56   Dg Chest 2 View  Result Date: 07/25/2018 CLINICAL DATA:  Chest pain. EXAM: CHEST - 2 VIEW  COMPARISON:  Most recent chest radiograph 06/22/2018. Lung bases from abdominal CT 06/22/2018 FINDINGS: Decreased right pleural effusion from prior radiograph, partially loculated small volume of pleural fluid remains. Possible partially loculated extrapleural air at the right lung base, not well assessed radiographically. Patchy opacities throughout the right hemithorax. Cardiomegaly is unchanged. Mild interstitial edema suspected. IMPRESSION: 1. Decreased right pleural effusion from prior radiograph, residual  partially loculated pleural effusion persists. Possible extrapleural air component at the right lung base, as seen from included lung bases on abdominal CT 06/22/2018. 2. Chronic cardiomegaly with mild interstitial edema. Patchy opacities throughout the right hemithorax may represent asymmetric edema or superimposed infection. Electronically Signed   By: Keith Rake M.D.   On: 07/25/2018 03:56   Ct Chest Wo Contrast  Result Date: 07/26/2018 CLINICAL DATA:  Shortness of breath; history CHF, end-stage renal disease on dialysis, non ischemic cardiomyopathy, hypertension, underwent thoracotomy for empyema in December 2019. EXAM: CT CHEST WITHOUT CONTRAST TECHNIQUE: Multidetector CT imaging of the chest was performed following the standard protocol without IV contrast. IV contrast was not administered since no adequate IV access could be established. Sagittal and coronal MPR images reconstructed from axial data set. COMPARISON:  01/31/2018 CT chest Correlation: CT abdomen and pelvis 06/22/2018 FINDINGS: Cardiovascular: Scattered atherosclerotic calcifications of aorta, coronary arteries and proximal great vessels. Enlargement of cardiac chambers. Small to moderate sized pericardial effusion slightly increased from previous exam. Upper normal caliber ascending thoracic aorta. Mediastinum/Nodes: Mildly enlarged precarinal lymph node 12 mm short axis image 64. Additional AP window node 14 mm short axis image 64. Normal sized RIGHT paratracheal and prevascular nodes. Base of cervical region normal appearance. Esophagus unremarkable. Lungs/Pleura: Central peribronchial thickening. Consolidation versus mass RIGHT lower lobe 4.7 x 3.8 cm image 111. Scattered pleural effusion and pleural thickening throughout lateral and posterior RIGHT hemithorax with loculated hydropneumothorax at the anterolateral RIGHT chest, likely representing residual or recurrent empyema in a patient with recent empyema. Basilar portion of RIGHT  has been present since the prior CT exam from December 2019, though the cranial extent of this was not previously imaged. Compressive atelectasis in RIGHT upper and RIGHT middle lobes. Upper Abdomen: Atrophic upper kidneys. Probable cyst upper pole LEFT kidney 20 x 20 mm. Remaining visualized upper abdomen unremarkable. Musculoskeletal: Diffuse osseous sclerosis likely renal osteodystrophy in patient with history of renal failure. IMPRESSION: Pleural effusion and pleural thickening in the RIGHT hemithorax with a large loculated anterolateral hydropneumothorax in the RIGHT chest, favor recurrent empyema in patient with recent empyema and surgery; recommend correlation with fluid analysis. At least the basilar portion of the RIGHT hydropneumothorax has been present since December 2012 though full extent has not been previously imaged. Extensive peribronchial thickening with rounded area of opacity in the RIGHT lower lobe approximately 4.7 x 3.8 cm question rounded pneumonia versus neoplasm. Enlargement of cardiac chambers with small to moderate-sized pericardial effusion increased since previous exam. Aortic Atherosclerosis (ICD10-I70.0). Findings called to the patient's nurse Susana RN on [facility] on 07/26/2018 at 0035 hrs. Electronically Signed   By: Lavonia Dana M.D.   On: 07/26/2018 00:47   Dg Chest Port 1 View  Result Date: 07/29/2018 CLINICAL DATA:  Chest tube with chest pain EXAM: PORTABLE CHEST 1 VIEW COMPARISON:  07/25/2018 FINDINGS: Right base chest tube. There is small residual right hydropneumothorax. Cardiomegaly with pericardial effusion by CT. Small left pleural effusion. Vascular congestion. IMPRESSION: 1. Stable basal hydropneumothorax on the right. 2. Cardiomegaly and pericardial effusion. Electronically Signed   By: Monte Fantasia  M.D.   On: 07/29/2018 07:13   Ct Image Guided Drainage By Percutaneous Catheter  Result Date: 07/28/2018 INDICATION: 53 year old male with a history of prior right-sided  decortication and recurrent hydropneumothorax. He has been referred for chest tube placement in attempt to collapse the potential space. EXAM: CT GUIDED RIGHT CHEST TUBE PLACEMENT MEDICATIONS: The patient is currently admitted to the hospital and receiving intravenous antibiotics. The antibiotics were administered within an appropriate time frame prior to the initiation of the procedure. ANESTHESIA/SEDATION: 1.5 mg IV Versed 75 mcg IV Fentanyl Moderate Sedation Time:  29 minutes The patient was continuously monitored during the procedure by the interventional radiology nurse under my direct supervision. COMPLICATIONS: None TECHNIQUE: Informed written consent was obtained from the patient after a thorough discussion of the procedural risks, benefits and alternatives. All questions were addressed. Maximal Sterile Barrier Technique was utilized including caps, mask, sterile gowns, sterile gloves, sterile drape, hand hygiene and skin antiseptic. A timeout was performed prior to the initiation of the procedure. PROCEDURE: Patient was positioned left decubitus. Scout CT of the chest performed. The lower right chest was prepped with chlorhexidine in a sterile fashion, and a sterile drape was applied covering the operative field. A sterile gown and sterile gloves were used for the procedure. Local anesthesia was provided with 1% Lidocaine. Using CT guidance, 18 gauge trocar needle was advanced into the complex fluid collection of the lower right chest. Modified Seldinger technique was used to place a 10 [place] drain into the pleural space. Catheter was attached to water seal aspiration chamber and sutured in position. Final images were stored and sent to PACs. Catheter was sutured in position. Patient tolerated procedure well and remained hemodynamically stable throughout. No complications were encountered and no significant blood loss. FINDINGS: CT images of the chest demonstrate pleural thickening on the right with  complex fluid collection and multiple loculations. Ten [place] drain terminates within the costophrenic sulcus within the fluid collection. IMPRESSION: Status post CT-guided right thoracostomy tube placement. Signed, Dulcy Fanny. Dellia Nims, RPVI [facility] Electronically Signed   By: Corrie Mckusick D.O.   On: 07/28/2018 16:25    Danna Hefty, DO 07/31/2018, 9:49 AM PGY-1, [facility] Intern pager: [phone], text pages welcome

## 2018-07-30 NOTE — Progress Notes (Addendum)
OT Cancellation Note  Patient Details Name: Frank [name] MRN: [id number] DOB: 1966/02/14   Cancelled Treatment:    Reason Eval/Treat Not Completed: Other (comment). Pt sitting up in recliner. Reports he feels he just got pain under control and does not want to set off his pain by moving around. Plan to reattempt later today as able.  Tyrone Schimke, OT Acute Rehabilitation Services Pager: [phone] Office: [phone]  07/30/2018, 10:42 AM    12:05 Reattempted. Pt declined stating, "lunch hasn't come yet." Plan to reattempt, likely tomorrow.

## 2018-07-30 NOTE — Progress Notes (Signed)
The patient endorsed pain from his pigtail drain insert/right side of his chest. He denies cough or SOB.  He seems to be stable from a hydropneumothorax standpoint s/p placement of right pleural pigtail catheter with suction.  Pain control per order. IR is managing his drain.   ESRD on HD: Per nephrology. He gets dialysis on M-W-F.  Other chronic conditions stable. I will cosign the resident's note once it is completed.

## 2018-07-30 NOTE — Progress Notes (Signed)
PT Cancellation Note  Patient Details Name: Frank [name] MRN: [id number] DOB: Dec 26, 1965   Cancelled Treatment:    Reason Eval/Treat Not Completed: Patient declined, no reason specified(Chart reviewed, evlauation attempted again. Returned to room at requested time, pt again defers PT evalaution at this time, says he'll be ready 'after lunch.' WIll atempts evaluation again at later date/time. )  11:03 AM, 07/30/18 Etta Grandchild, PT, DPT Physical Therapist - [place] [phone] (Pager)  [phone] (Office)      Tennessee Perra C 07/30/2018, 11:03 AM

## 2018-07-30 NOTE — Progress Notes (Signed)
Supervising Physician: Markus Daft  Patient Status: [facility] - In-pt  Subjective: S/p (R)pigtail chest drain for recurrent hydroPTX Pt feeling okay, a little sore, c/o spasms  Objective: Physical Exam: BP (!) 159/96 (BP Location: Left Arm)   Pulse 76   Temp 98 F (36.7 C)   Resp 18   Ht _0  (1.88 m)   Wt 72.5 kg   SpO2 98%   BMI 20.52 kg/m  (R)chest drain intact, site clean. Serous output in pleurevac. No obvious airleak   Current Facility-Administered Medications:  .  amLODipine (NORVASC) tablet 5 mg, 5 mg, Oral, Daily, Anderson, Chelsey L, DO, 5 mg at 07/29/18 1015 .  apixaban (ELIQUIS) tablet 5 mg, 5 mg, Oral, Q12H, Karren Cobble, RPH, 5 mg at 07/30/18 0608 .  busPIRone (BUSPAR) tablet 5 mg, 5 mg, Oral, BID, Anderson, Chelsey L, DO, 5 mg at 07/30/18 0914 .  Chlorhexidine Gluconate Cloth 2 % PADS 6 each, 6 each, Topical, Q0600, Penninger, Ria Comment, PA .  cinacalcet (SENSIPAR) tablet 90 mg, 90 mg, Oral, QPM, Anderson, Chelsey L, DO, 90 mg at 07/29/18 1956 .  diclofenac sodium (VOLTAREN) 1 % transdermal gel 2 g, 2 g, Topical, BID PRN, Anderson, Chelsey L, DO, 2 g at 07/29/18 2340 .  dicyclomine (BENTYL) capsule 10 mg, 10 mg, Oral, QID PRN, Ouida Sills, Chelsey L, DO .  feeding supplement (NEPRO CARB STEADY) liquid 237 mL, 237 mL, Oral, BID BM, McDiarmid, Blane Ohara, MD, Last Rate: 0 mL/hr at 07/29/18 1415, 237 mL at 07/30/18 0914 .  hydrALAZINE (APRESOLINE) tablet 100 mg, 100 mg, Oral, TID, Anderson, Chelsey L, DO, 100 mg at 07/30/18 0913 .  HYDROmorphone (DILAUDID) injection 0.75 mg, 0.75 mg, Intravenous, Q4H PRN, McDiarmid, Blane Ohara, MD, 0.75 mg at 07/30/18 0607 .  lanthanum (FOSRENOL) chewable tablet 1,000 mg, 1,000 mg, Oral, TID WC, Anderson, Chelsey L, DO, 1,000 mg at 07/30/18 0913 .  lidocaine (LIDODERM) 5 % 1 patch, 1 patch, Transdermal, Once PRN, Doristine Mango L, DO, 1 patch at 07/30/18 0056 .  lipase/protease/amylase (CREON) capsule 12,000 Units, 12,000 Units,  Oral, TID Rance Muir, Chelsey L, DO, 12,000 Units at 07/30/18 0914 .  multivitamin (RENA-VIT) tablet 1 tablet, 1 tablet, Oral, QHS, Anderson, Chelsey L, DO, 1 tablet at 07/29/18 2306 .  nitroGLYCERIN (NITROSTAT) SL tablet 0.4 mg, 0.4 mg, Sublingual, Q5 min PRN, Anderson, Chelsey L, DO .  ondansetron (ZOFRAN) tablet 8 mg, 8 mg, Oral, Q8H PRN, Anderson, Chelsey L, DO, 8 mg at 07/30/18 0931 .  pantoprazole (PROTONIX) EC tablet 40 mg, 40 mg, Oral, Daily, Anderson, Chelsey L, DO, 40 mg at 07/30/18 0913 .  polyethylene glycol (MIRALAX / GLYCOLAX) packet 17 g, 17 g, Oral, Daily PRN, Ouida Sills, Chelsey L, DO .  senna-docusate (Senokot-S) tablet 2 tablet, 2 tablet, Oral, QHS PRN, Anderson, Chelsey L, DO .  sodium chloride flush (NS) 0.9 % injection 5 mL, 5 mL, Intracatheter, Q8H, Corrie Mckusick, DO, Stopped at 07/29/18 2200 .  zolpidem (AMBIEN) tablet 5 mg, 5 mg, Oral, QHS, Anderson, Chelsey L, DO, 5 mg at 07/28/18 2215  Labs: CBC Recent Labs    07/28/18 0443 07/29/18 0404  WBC 4.5 4.3  HGB 7.4* 7.9*  HCT 24.4* 26.3*  PLT 239 199   BMET Recent Labs    07/28/18 0443  NA 135  K 4.9  CL 93*  CO2 25  GLUCOSE 76  BUN 46*  CREATININE 14.59*  CALCIUM 8.0*   LFT Recent Labs    07/28/18  0443  ALBUMIN 2.2*   PT/INR Recent Labs    07/28/18 0443  LABPROT 16.9*  INR 1.39     Studies/Results: Dg Chest Port 1 View  Result Date: 07/29/2018 CLINICAL DATA:  Chest tube with chest pain EXAM: PORTABLE CHEST 1 VIEW COMPARISON:  07/25/2018 FINDINGS: Right base chest tube. There is small residual right hydropneumothorax. Cardiomegaly with pericardial effusion by CT. Small left pleural effusion. Vascular congestion. IMPRESSION: 1. Stable basal hydropneumothorax on the right. 2. Cardiomegaly and pericardial effusion. Electronically Signed   By: Monte Fantasia M.D.   On: 07/29/2018 07:13   Ct Image Guided Drainage By Percutaneous Catheter  Result Date: 07/28/2018 INDICATION: 53 year old male with  a history of prior right-sided decortication and recurrent hydropneumothorax. He has been referred for chest tube placement in attempt to collapse the potential space. EXAM: CT GUIDED RIGHT CHEST TUBE PLACEMENT MEDICATIONS: The patient is currently admitted to the hospital and receiving intravenous antibiotics. The antibiotics were administered within an appropriate time frame prior to the initiation of the procedure. ANESTHESIA/SEDATION: 1.5 mg IV Versed 75 mcg IV Fentanyl Moderate Sedation Time:  29 minutes The patient was continuously monitored during the procedure by the interventional radiology nurse under my direct supervision. COMPLICATIONS: None TECHNIQUE: Informed written consent was obtained from the patient after a thorough discussion of the procedural risks, benefits and alternatives. All questions were addressed. Maximal Sterile Barrier Technique was utilized including caps, mask, sterile gowns, sterile gloves, sterile drape, hand hygiene and skin antiseptic. A timeout was performed prior to the initiation of the procedure. PROCEDURE: Patient was positioned left decubitus. Scout CT of the chest performed. The lower right chest was prepped with chlorhexidine in a sterile fashion, and a sterile drape was applied covering the operative field. A sterile gown and sterile gloves were used for the procedure. Local anesthesia was provided with 1% Lidocaine. Using CT guidance, 18 gauge trocar needle was advanced into the complex fluid collection of the lower right chest. Modified Seldinger technique was used to place a 10 [place] drain into the pleural space. Catheter was attached to water seal aspiration chamber and sutured in position. Final images were stored and sent to PACs. Catheter was sutured in position. Patient tolerated procedure well and remained hemodynamically stable throughout. No complications were encountered and no significant blood loss. FINDINGS: CT images of the chest demonstrate pleural  thickening on the right with complex fluid collection and multiple loculations. Ten [place] drain terminates within the costophrenic sulcus within the fluid collection. IMPRESSION: Status post CT-guided right thoracostomy tube placement. Signed, Dulcy Fanny. Dellia Nims, RPVI [facility] Electronically Signed   By: Corrie Mckusick D.O.   On: 07/28/2018 16:25    Assessment/Plan: S/p (R)pigtail chest drain for recurrent hydroPTX Not much output CXR ordered. Canceled flush order Continue to monitor output and CXR    LOS: 4 days   I spent a total of 15 minutes in face to face in clinical consultation, greater than 50% of which was counseling/coordinating care for (R)chest tube  Ascencion Dike PA-C 07/30/2018 9:14 AM

## 2018-07-31 LAB — CBC WITH DIFFERENTIAL/PLATELET
Abs Immature Granulocytes: 0.03 10*3/uL (ref 0.00–0.07)
Basophils Absolute: 0 10*3/uL (ref 0.0–0.1)
Basophils Relative: 0 %
Eosinophils Absolute: 1.3 10*3/uL — ABNORMAL HIGH (ref 0.0–0.5)
Eosinophils Relative: 22 %
HCT: 27.9 % — ABNORMAL LOW (ref 39.0–52.0)
Hemoglobin: 8.6 g/dL — ABNORMAL LOW (ref 13.0–17.0)
IMMATURE GRANULOCYTES: 1 %
Lymphocytes Relative: 8 %
Lymphs Abs: 0.5 10*3/uL — ABNORMAL LOW (ref 0.7–4.0)
MCH: 25.2 pg — ABNORMAL LOW (ref 26.0–34.0)
MCHC: 30.8 g/dL (ref 30.0–36.0)
MCV: 81.8 fL (ref 80.0–100.0)
Monocytes Absolute: 0.8 10*3/uL (ref 0.1–1.0)
Monocytes Relative: 13 %
NEUTROS PCT: 56 %
NRBC: 0 % (ref 0.0–0.2)
Neutro Abs: 3.3 10*3/uL (ref 1.7–7.7)
Platelets: 173 10*3/uL (ref 150–400)
RBC: 3.41 MIL/uL — ABNORMAL LOW (ref 4.22–5.81)
RDW: 18.6 % — ABNORMAL HIGH (ref 11.5–15.5)
WBC: 6 10*3/uL (ref 4.0–10.5)

## 2018-07-31 IMAGING — DX DG CHEST 2V
2 series · 2 of 2 positions shown · non-contrast
Comparison: 03/05/2016.  02/11/2016

CLINICAL DATA: Nausea vomiting.

EXAM:
CHEST  2 VIEW

[chest pa]
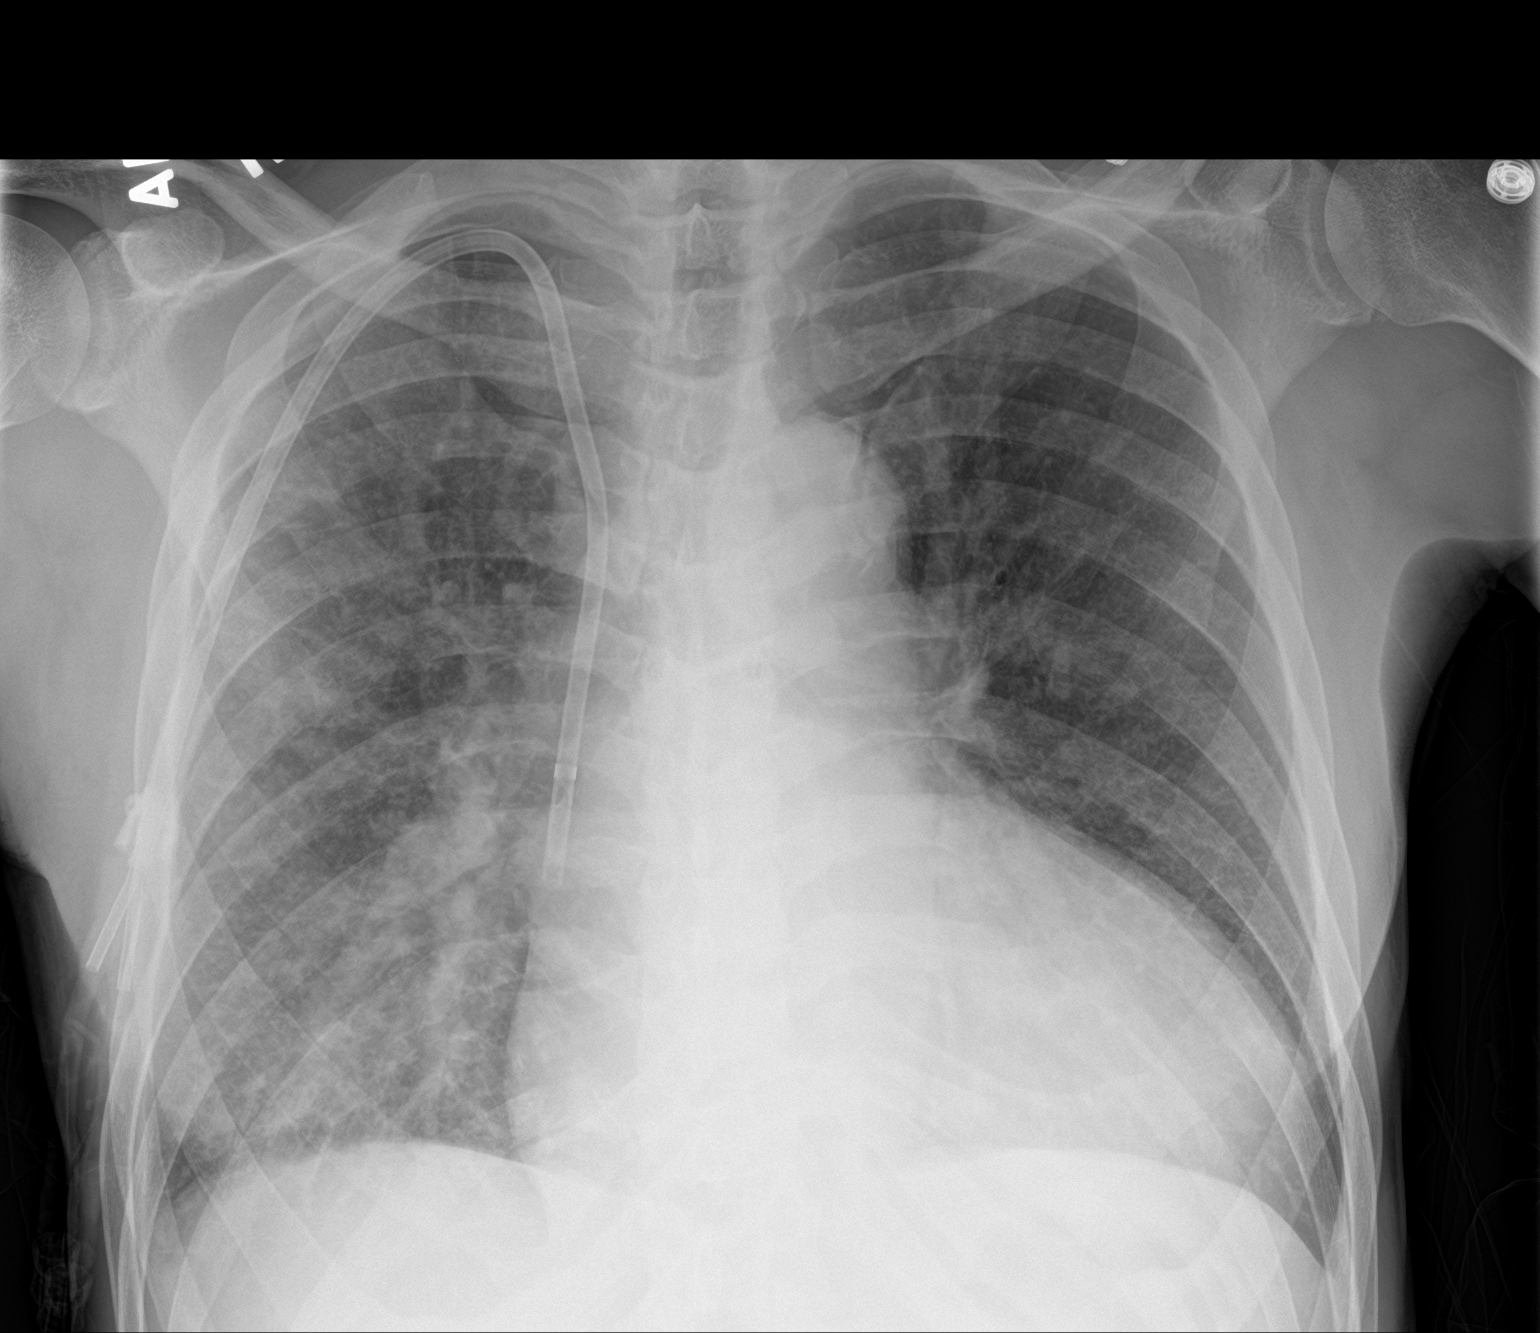

[chest lat]
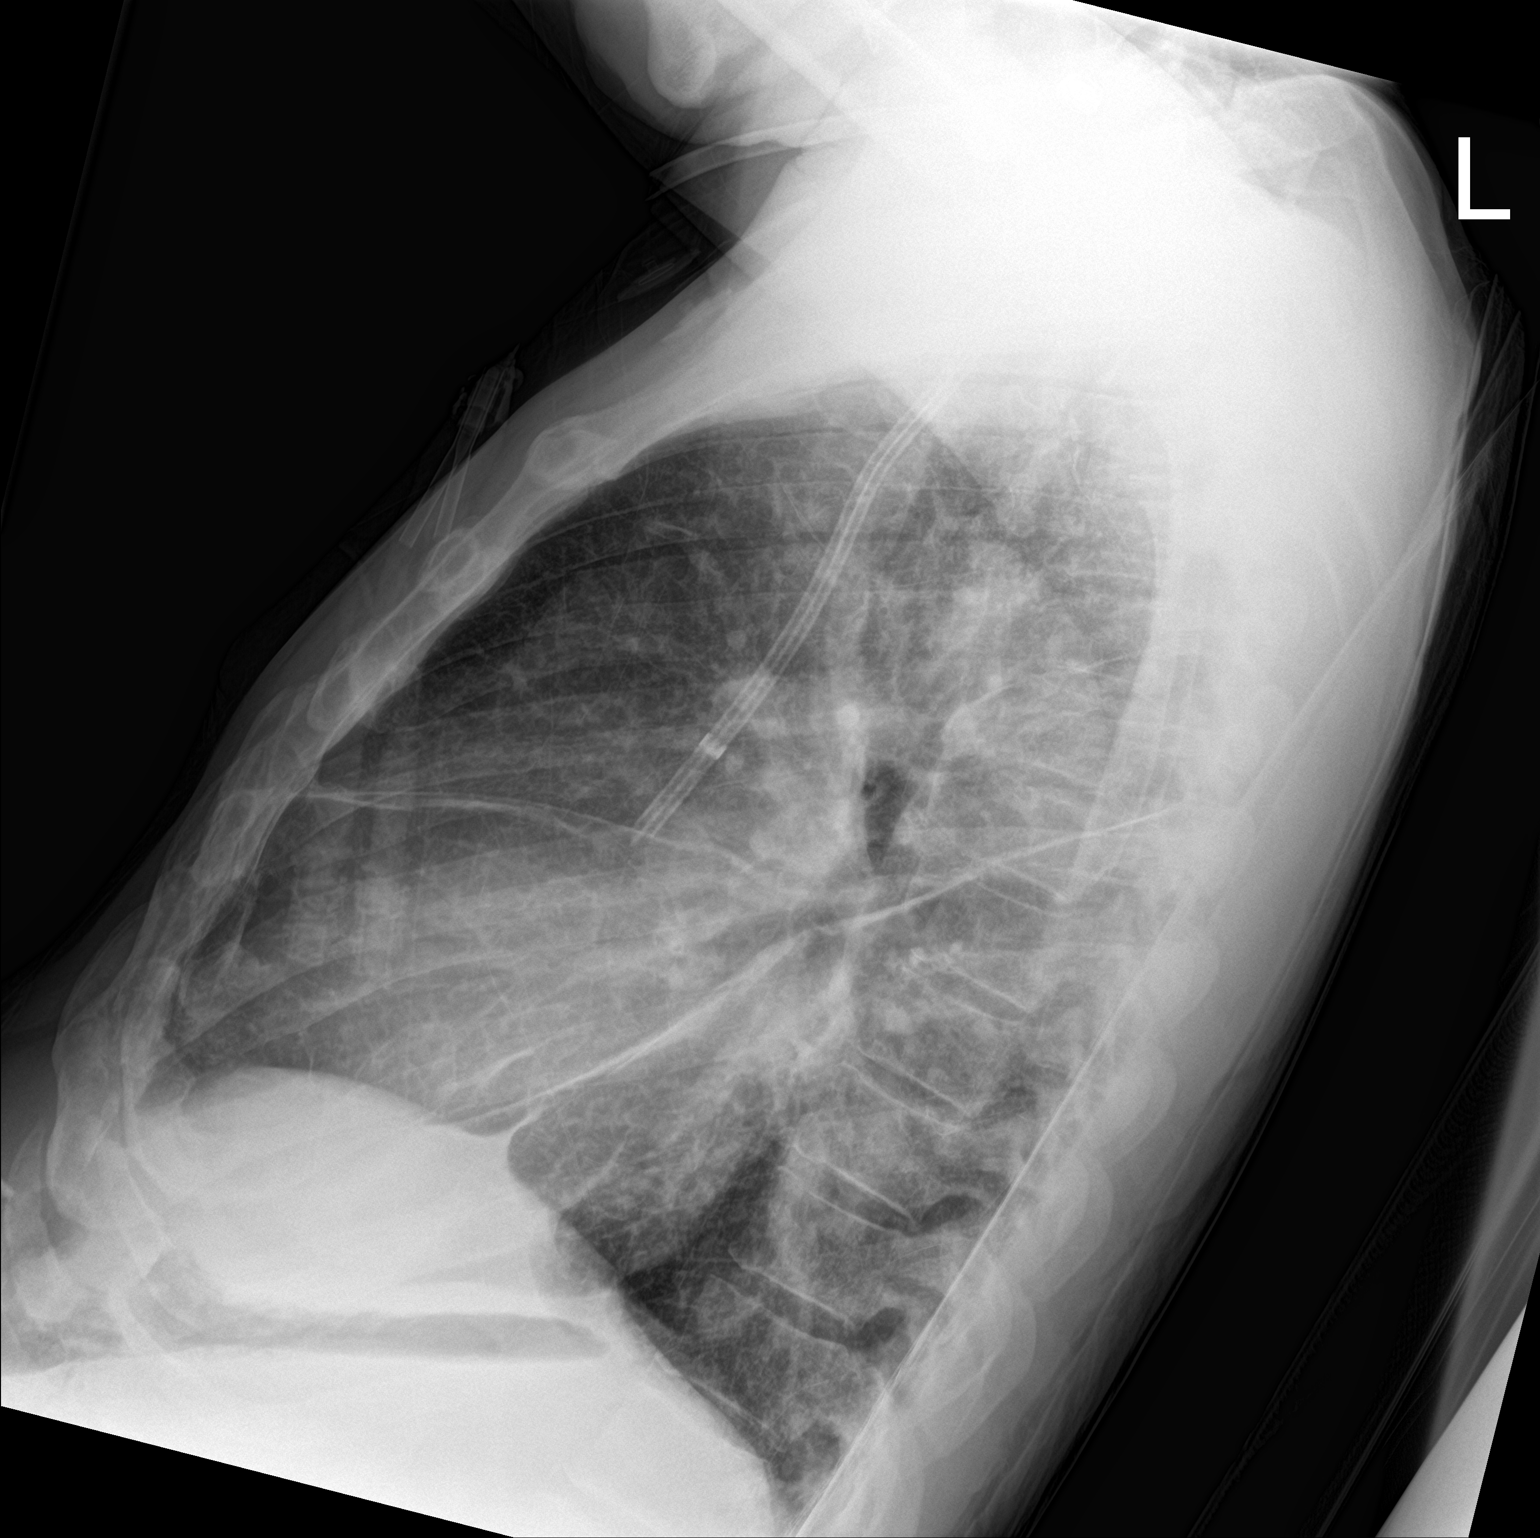

[2 of 2 positions shown; findings below may reference images not displayed]

FINDINGS: Dialysis catheter in stable position. Cardiomegaly with pulmonary
vascular prominence and bilateral interstitial prominence consistent
congestive heart failure. Findings have progressed from prior study
03/05/2016.
IMPRESSION: 1. Dialysis catheter stable position.

2. Congestive heart failure with progressive bilateral pulmonary
interstitial edema .

## 2018-07-31 MED ORDER — CHLORHEXIDINE GLUCONATE CLOTH 2 % EX PADS: 6.0000 | MEDICATED_PAD | Freq: Every day | CUTANEOUS | Status: DC

## 2018-07-31 MED ORDER — LIDOCAINE 5 % EX PTCH: 1.0000 | MEDICATED_PATCH | Freq: Once | CUTANEOUS | Status: DC | PRN

## 2018-07-31 MED ORDER — LIDOCAINE 5 % EX PTCH
1.0000 | MEDICATED_PATCH | Freq: Two times a day (BID) | CUTANEOUS | Status: DC | PRN
  Administered 2018-07-31 – 2018-08-02 (×3): 1 via TRANSDERMAL
  Filled 2018-07-31 (×6): qty 1

## 2018-07-31 NOTE — Progress Notes (Signed)
Patient allowed this nurse to complete the shift physical assessment, and administer most evening meds at this time.    Please see MAR for list of administered and refused medications.

## 2018-07-31 NOTE — Progress Notes (Addendum)
[address]       [place] [address]             [phone]         Subjective: Feels okay this morning-having some back pain which is chronic  Objective: Vital signs in last 24 hours: Temp:  [97.6 F (36.4 C)-98 F (36.7 C)] 97.6 F (36.4 C) (01/10 0536) Pulse Rate:  [70-93] 70 (01/10 0536) Resp:  [12-18] 16 (01/10 0536) BP: (133-192)/(55-90) 150/86 (01/10 0536) SpO2:  [95 %-100 %] 100 % (01/10 0536) Weight:  [70.4 kg-72.8 kg] 72.8 kg (01/10 0536)     Intake/Output from previous day: 01/09 0701 - 01/10 0700 In: -  Out: 2003  Intake/Output this shift: No intake/output data recorded.  General appearance: alert, cooperative and no distress Heart: regular rate and rhythm, S1, S2 normal, no murmur, click, rub or gallop Lungs: clear to auscultation bilaterally Abdomen: soft, non-tender; bowel sounds normal; no masses,  no organomegaly Extremities: extremities normal, atraumatic, no cyanosis or edema Wound: pigtail catheter in good position  Lab Results: Recent Labs    07/30/18 1204 07/31/18 0633  WBC 1.4* 6.0  HGB 8.4* 8.6*  HCT 27.5* 27.9*  PLT 187 173   BMET:  Recent Labs    07/30/18 1204  NA 134*  K 4.5  CL 97*  CO2 26  GLUCOSE 117*  BUN 26*  CREATININE 10.49*  CALCIUM 7.8*    PT/INR: No results for input(s): LABPROT, INR in the last 72 hours. ABG    Component Value Date/Time   HCO3 19.3 (L) 12/31/2014 0801   TCO2 31 06/15/2018 2129   ACIDBASEDEF 7.0 (H) 12/31/2014 0801   O2SAT 45.0 12/31/2014 0801   CBG (last 3)  Recent Labs    07/28/18 0908 07/28/18 1018 07/29/18 2350  GLUCAP 75 106* 85    Assessment/Plan: S/P pigtail catheter placement  1. Chest tube placed on water seal this morning. No air leak and no drainage recorded. CXR order placed for tomorrow morning.  2. Cultures are NTD. Will continue to watch.  3. Patient requesting lidocaine patch that was ordered yesterday-will re-order. He is also taking  Voltaren gel PRN.  4. No fevers, WBC 6.0  5. Hypertension-care per primary 6. ESRD-on HD, followed by Nephrology   Plan: Placed on suction today, CXR tomorrow morning. Continue to monitor cultures.     LOS: 5 days    Elgie Collard 07/31/2018  DC pigtail cath placed by IR in am if no change in CXR The fluid drained is scant and cultures are negative. If a space develops after drain removal it is due to pleural thickening from past empyema and can be followed and doesn't need intervention  patient examined and medical record reviewed,agree with above note. Tharon Aquas Trigt III 07/31/2018

## 2018-07-31 NOTE — Progress Notes (Signed)
Offered to walk pt twice to assessambulatory pulse ox rate; pt refused.

## 2018-07-31 NOTE — Progress Notes (Signed)
Patient requested that his right EJ IV access be removed, stated "it's leaking."  Pt would not allow this nurse to attempt to flush the IV to check for patency.  This nurse explained the importance of having IV access and asked if the IV team could be paged to place a new IV.  The patient refused.    IV was removed at patient's request and the on-call FMTS provider was made aware.  Will continue to monitor.

## 2018-07-31 NOTE — Progress Notes (Addendum)
[facility] Progress Note   Subjective:  Seen and examined at bedside.  Feeling a little better today, pain improving, continues to have soreness with breathing. Complains of dry/cracked skin.  No other complaints. States HD went well yesterday.  Objective Vitals:   07/30/18 1730 07/30/18 1800 07/30/18 2101 07/31/18 0536  BP: (!) 169/67 (!) 168/65 133/79 (!) 150/86  Pulse: 83 79 88 70  Resp: _0 Temp:  97.8 F (36.6 C)  97.6 F (36.4 C)  TempSrc:  Oral    SpO2: 95% 95% 100% 100%  Weight:  70.4 kg  72.8 kg  Height:       Physical Exam General:NAD, chronically ill appearing male, with dry cracked skin Heart:RRR Lungs:BS decreased, mostly CTAB, hard to appreciate due to patient inability to take a deep breath Abdomen:soft, NTND Extremities:trace LE edema, dry cracked skin Dialysis Access: LU AVF +b/t  Filed Weights   07/30/18 1340 07/30/18 1800 07/31/18 0536  Weight: 72.4 kg 70.4 kg 72.8 kg    Intake/Output Summary (Last 24 hours) at 07/31/2018 1232 Last data filed at 07/30/2018 1900 Gross per 24 hour  Intake -  Output 2003 ml  Net -2003 ml    Additional Objective Labs: Basic Metabolic Panel: Recent Labs  Lab 07/26/18 0616 07/28/18 0443 07/30/18 1204  NA 135 135 134*  K 4.2 4.9 4.5  CL 94* 93* 97*  CO2 _1 GLUCOSE 103* 76 117*  BUN 31* 46* 26*  CREATININE 11.76* 14.59* 10.49*  CALCIUM 9.0 8.0* 7.8*  PHOS 6.7* 6.2* 4.3   Liver Function Tests: Recent Labs  Lab 07/26/18 0616 07/28/18 0443 07/30/18 1204  AST 19  --   --   ALT 6  --   --   ALKPHOS 92  --   --   BILITOT 1.1  --   --   PROT 7.8  --   --   ALBUMIN 2.1* 2.2* 2.1*   Recent Labs  Lab 07/25/18 1135  LIPASE 31   CBC: Recent Labs  Lab 07/26/18 0616 07/28/18 0443 07/29/18 0404 07/30/18 1204 07/31/18 0633  WBC 5.5 4.5 4.3 1.4* 6.0  NEUTROABS  --   --   --   --  3.3  HGB 8.3* 7.4* 7.9* 8.4* 8.6*  HCT 26.0* 24.4* 26.3* 27.5* 27.9*  MCV 80.7 80.0 80.4 81.8  81.8  PLT 250 239 199 187 173   Blood Culture    Component Value Date/Time   SDES PLEURAL 07/29/2018 1300   SDES PLEURAL 07/29/2018 1300   SPECREQUEST NONE 07/29/2018 1300   SPECREQUEST NONE 07/29/2018 1300   CULT  07/29/2018 1300    NO GROWTH 2 DAYS Performed at [facility] [address] [place],  [address]    REPTSTATUS PENDING 07/29/2018 1300   REPTSTATUS 07/29/2018 FINAL 07/29/2018 1300    Cardiac Enzymes: Recent Labs  Lab 07/25/18 2253 07/26/18 0616  TROPONINI <0.03 <0.03   CBG: Recent Labs  Lab 07/26/18 0532 07/26/18 0611 07/28/18 0908 07/28/18 1018 07/29/18 2350  GLUCAP 64* 93 75 106* 85   Lab Results  Component Value Date   INR 1.39 07/28/2018   INR 1.36 04/26/2018   INR 1.2 (A) 01/15/2018   Studies/Results: Dg Chest 1 View  Result Date: 07/30/2018 CLINICAL DATA:  Hydropneumothorax. EXAM: CHEST  1 VIEW COMPARISON:  Radiograph of July 29, 2018. FINDINGS: Stable cardiomegaly. No pneumothorax is noted. Stable left basilar opacity is noted concerning for atelectasis or infiltrate with  associated pleural effusion. Stable position of right-sided pigtail drainage catheter in right lung base. Stable right basilar hydropneumothorax is noted. Bony thorax is unremarkable. IMPRESSION: Stable cardiomegaly. Stable position of right-sided pigtail drainage catheter in right lung base with stable right basilar hydropneumothorax. Stable left basilar opacity as described above. Electronically Signed   By: Marijo Conception, M.D.   On: 07/30/2018 13:56    Medications:  . amLODipine  5 mg Oral Daily  . apixaban  5 mg Oral Q12H  . busPIRone  5 mg Oral BID  . Chlorhexidine Gluconate Cloth  6 each Topical Q0600  . cinacalcet  90 mg Oral QPM  . feeding supplement (NEPRO CARB STEADY)  237 mL Oral BID BM  . hydrALAZINE  100 mg Oral TID  . lanthanum  1,000 mg Oral TID WC  . lipase/protease/amylase  12,000 Units Oral TID AC  . multivitamin  1 tablet Oral QHS  .  pantoprazole  40 mg Oral Daily  . sodium chloride flush  5 mL Intracatheter Q8H  . zolpidem  5 mg Oral QHS    Dialysis Orders: [facility] MWF  4h 400/800 74kg 2/2.25 bath Hep none LUA AVF No heparin  -Hectorol 20mg IV TIW -Venofer 1046mIV x 10 (started 1/3)  -Mircera 225 mcg IV 2 weeks (last 1/3)  Assessment/Plan: 1.CP/Dyspnea - R recurrent pleural effusion: [facility] and [facility], now spR pigtail drain placed 07/28/18 by IR. Cultures NGTD. CXR 1/8 showed small R residual hydropneumothorax.  Per primary/IR. 2. ESRD -on HD MWF. K 4.5. Orders written for HD tomorrow, off schedule.  Will return to regular schedule next week. 3. Anemia of CKD-Hgb^ 8.6. ESA dosed as OP on 07/24/18. Follow trends.Will transfuse if Hgb<7.  4. Secondary hyperparathyroidism -Ca and phos in goal. Continue VDRA/binders/Sensipar. Follow trends. 5. HTN/volume -BPelevated.  Continue home meds. Does not appear grossly volume overloaded on exam.Titrate down volume as tolerated. 6. Nutrition -Renal diet w/fluid restrictions, renal vitamins, protein supplements. Alb 2.1 7. Chronic pancreatitis/nausea/vomiting - per primary  LiJen MowPA-C [facility] Pager: [phone]/04/2019,12:32 PM  LOS: 5 days   Pt seen, examined and agree w A/P as above.  RoKelly SplinterD Ca[facility] Rubbermaidager [phone] 07/31/2018, 12:51 PM

## 2018-07-31 NOTE — Evaluation (Addendum)
Physical Therapy Evaluation Patient Details Name: Frank [name] MRN: [id number]  DOB: 02/13/1966 Today's Date: 07/31/2018   History of Present Illness  53 y.o. male presenting with SOB, R-sided chest pain, nausea/vomiting, diarrhea. PMH is significant for right sided empyema with thoracotomy 06/2018, VTE on Eliquis, chronic pancreatitis.   Clinical Impression  Pt admitted with/for the above list of acute problems stated above.  Pt has recently been through a thoracotomy for empyema and likely had not recovered from that hospital stay.  Pt presently needs minimal to light moderate assist for basic mobility and gait.  Pt currently limited functionally due to the problems listed. ( See problems list.)   Pt will benefit from PT to maximize function and safety in order to get ready for next venue listed below.     Follow Up Recommendations SNF    Equipment Recommendations  Other (comment)(TBA)    Recommendations for Other Services       Precautions / Restrictions Precautions Precautions: Fall Precaution Comments: R side chest tube Restrictions Weight Bearing Restrictions: No      Mobility  Bed Mobility               General bed mobility comments: OOB on arrival  Transfers Overall transfer level: Needs assistance Equipment used: None Transfers: Sit to/from Stand;Stand Pivot Transfers Sit to Stand: Min assist Stand pivot transfers: Min assist       General transfer comment: assist to come forward without need for much boost to standing  Ambulation/Gait Ambulation/Gait assistance: Min guard;Min assist Gait Distance (Feet): 60 Feet Assistive device: None(occasional reach for the rail) Gait Pattern/deviations: Step-through pattern Gait velocity: slower Gait velocity interpretation: <1.8 ft/sec, indicate of risk for recurrent falls General Gait Details: guarded and mildly unsteady overall.  moderately excessive lateral w/shifting.  Sats maintained  in the mid 90's on 2L  [place]  Stairs            Wheelchair Mobility    Modified Rankin (Stroke Patients Only)       Balance Overall balance assessment: Mild deficits observed, not formally tested                                           Pertinent Vitals/Pain Pain Assessment: Faces Faces Pain Scale: Hurts little more Pain Location: back, R arm and flank Pain Descriptors / Indicators: Guarding;Grimacing Pain Intervention(s): Monitored during session    Home Living Family/patient expects to be discharged to:: Private residence Living Arrangements: Alone Available Help at Discharge: (pt reports no available assist at home) Type of Home: Apartment Home Access: Stairs to enter   Entrance Stairs-Number of Steps: 4 Home Layout: One level Home Equipment: Walker - 2 wheels;Shower seat;Cane - single point Additional Comments: home setup from previous admission in Oct 2019    Prior Function Level of Independence: Independent with assistive device(s)               Hand Dominance   Dominant Hand: Right    Extremity/Trunk Assessment   Upper Extremity Assessment Upper Extremity Assessment: Defer to OT evaluation    Lower Extremity Assessment Lower Extremity Assessment: Overall WFL for tasks assessed(generall proximal "large muscle  group" weakness:   )       Communication   Communication: No difficulties  Cognition Arousal/Alertness: Awake/alert Behavior During Therapy: Flat affect Overall Cognitive Status: Within Functional Limits for tasks assessed  General Comments: flat affect, terse, low speech volume. cognition WFL for basic tasks assessed during session.      General Comments      Exercises     Assessment/Plan    PT Assessment Patient needs continued PT services  PT Problem List Decreased strength;Decreased activity tolerance;Decreased balance;Decreased mobility;Pain       PT Treatment Interventions       PT Goals (Current goals can be found in the Care Plan section)  Acute Rehab PT Goals Patient Stated Goal: seems agreeable to rehab at SNF before home  PT Goal Formulation: With patient Time For Goal Achievement: 08/14/18 Potential to Achieve Goals: Good    Frequency Min 3X/week   Barriers to discharge        Co-evaluation               AM-PAC PT "6 Clicks" Mobility  Outcome Measure Help needed turning from your back to your side while in a flat bed without using bedrails?: A Lot Help needed moving from lying on your back to sitting on the side of a flat bed without using bedrails?: A Lot Help needed moving to and from a bed to a chair (including a wheelchair)?: A Little Help needed standing up from a chair using your arms (e.g., wheelchair or bedside chair)?: A Little Help needed to walk in hospital room?: A Little Help needed climbing 3-5 steps with a railing? : A Lot 6 Click Score: 15    End of Session Equipment Utilized During Treatment: Oxygen Activity Tolerance: Patient tolerated treatment well Patient left: in bed;with call bell/phone within reach;with bed alarm set Nurse Communication: Mobility status PT Visit Diagnosis: Unsteadiness on feet (R26.81);Other abnormalities of gait and mobility (R26.89);Muscle weakness (generalized) (M62.81)    Time: 4584-8350 PT Time Calculation (min) (ACUTE ONLY): 19 min   Charges:   PT Evaluation $PT Eval Moderate Complexity: 1 Mod          07/31/2018  Donnella Sham, PT Acute Rehabilitation Services [phone]  (pager) [phone]  (office)  Tessie Fass Abdalla Naramore 07/31/2018, 5:11 PM

## 2018-07-31 NOTE — Progress Notes (Signed)
Patient refused to wear oxygen and the continuous pulse ox, despite being educated on the importance of monitoring his oxygen levels.  Patient refused for me to view his chest tube site or auscultate his lungs .  Will continue to monitor the patient and notify as needed.

## 2018-07-31 NOTE — Evaluation (Signed)
Occupational Therapy Evaluation Patient Details Name: Frank [name] MRN: [id number] DOB: Oct 19, 1965 Today's Date: 07/31/2018    History of Present Illness 53 y.o. male presenting with SOB, R-sided chest pain, nausea/vomiting, diarrhea. PMH is significant for right sided empyema with thoracotomy 06/2018, VTE on Eliquis, chronic pancreatitis.    Clinical Impression   Pt admitted with the above diagnoses and presents with below problem list. Pt will benefit from continued acute OT to address the below listed deficits and maximize independence with basic ADLs prior to d/c to venue below. PTA pt was likely mod I with ADLs for extra time and effort; he reports using no AD for household mobility. Pt is currently min A for most ADLs and functional transfers. Pt reports living alone with no available assist at d/c, agreeable at this time to [facility] for rehab recommendation.      Follow Up Recommendations  SNF    Equipment Recommendations  Other (comment)(defer to next venue)    Recommendations for Other Services PT consult     Precautions / Restrictions Precautions Precautions: Fall Precaution Comments: R side chest tube Restrictions Weight Bearing Restrictions: No      Mobility Bed Mobility Overal bed mobility: Needs Assistance Bed Mobility: Supine to Sit     Supine to sit: Mod assist;HOB elevated     General bed mobility comments: assist to powerup trunk and pivot hips to full EOB position  Transfers Overall transfer level: Needs assistance Equipment used: None Transfers: Sit to/from [facility] Sit to Stand: Min assist Stand pivot transfers: Min assist       General transfer comment: from EOB with height elevated, to recliner. assist to steady.    Balance Overall balance assessment: Mild deficits observed, not formally tested                                         ADL either performed or assessed with clinical judgement   ADL Overall  ADL's : Needs assistance/impaired Eating/Feeding: Set up;Sitting   Grooming: Set up;Sitting   Upper Body Bathing: Minimal assistance;Sitting   Lower Body Bathing: Minimal assistance;Sit to/from stand   Upper Body Dressing : Minimal assistance;Sitting   Lower Body Dressing: Minimal assistance;Sit to/from stand   Toilet Transfer: Minimal Production assistant, radio Details (indicate cue type and reason): simulated with EOB>recliner Toileting- Clothing Manipulation and Hygiene: Minimal assistance;Sit to/from stand   Tub/ Shower Transfer: Minimal assistance;Stand-pivot     General ADL Comments: Pt completed bed mobility, sat EOB a few minutes then SPT to recliner     Vision         Perception     Praxis      Pertinent Vitals/Pain Pain Assessment: Faces Faces Pain Scale: Hurts little more Pain Intervention(s): Monitored during session     Hand Dominance Right   Extremity/Trunk Assessment Upper Extremity Assessment Upper Extremity Assessment: Generalized weakness   Lower Extremity Assessment Lower Extremity Assessment: Defer to PT evaluation       Communication Communication Communication: No difficulties   Cognition Arousal/Alertness: Awake/alert Behavior During Therapy: Flat affect Overall Cognitive Status: Within Functional Limits for tasks assessed                                 General Comments: flat affect, terse, low speech volume. cognition WFL for basic tasks assessed during  session.   General Comments  Pt reporting dizziness once sitting EOB and throughout session. VSS, assessed at end of session with pt sitting up in recliner    Exercises     Shoulder Instructions      [place] expects to be discharged to:: Private residence Living Arrangements: Alone Available Help at Discharge: (pt reports no available assist at home) Type of Home: Apartment Home Access: Stairs to enter Entrance Stairs-Number  of Steps: [place]: One level     Bathroom Shower/Tub: Teacher, early years/pre: Handicapped height     Home Equipment: Environmental consultant - 2 wheels;Shower seat;Cane - single point   Additional Comments: home setup from previous admission in Oct 2019      Prior Functioning/Environment Level of Independence: Independent with assistive device(s)                 OT Problem List: Decreased activity tolerance;Impaired balance (sitting and/or standing);Decreased knowledge of use of DME or AE;Decreased knowledge of precautions;Cardiopulmonary status limiting activity;Pain      OT Treatment/Interventions: Self-care/ADL training;Energy conservation;DME and/or AE instruction;Therapeutic activities;Patient/family education;Balance training    OT Goals(Current goals can be found in the care plan section) Acute Rehab OT Goals Patient Stated Goal: seems agreeable to rehab at SNF before home  OT Goal Formulation: With patient Time For Goal Achievement: 08/14/18 Potential to Achieve Goals: Good ADL Goals Pt Will Perform Grooming: standing;with supervision Pt Will Perform Upper Body Bathing: with modified independence;sitting Pt Will Perform Lower Body Bathing: with modified independence;sit to/from stand Pt Will Perform Upper Body Dressing: with modified independence;sitting Pt Will Perform Lower Body Dressing: with modified independence;sit to/from stand Pt Will Transfer to Toilet: with modified independence;ambulating Pt Will Perform Toileting - Clothing Manipulation and hygiene: with modified independence;sit to/from stand Additional ADL Goal #1: Pt will complete bed mobility at mod I level to prepare for OOB ADLs.  OT Frequency: Min 2X/week   Barriers to D/C:            Co-evaluation              AM-PAC OT "6 Clicks" Daily Activity     Outcome Measure Help from another person eating meals?: None Help from another person taking care of personal grooming?: A  Little Help from another person toileting, which includes using toliet, bedpan, or urinal?: A Little Help from another person bathing (including washing, rinsing, drying)?: A Little Help from another person to put on and taking off regular upper body clothing?: A Little Help from another person to put on and taking off regular lower body clothing?: A Little 6 Click Score: 19   End of Session Equipment Utilized During Treatment: Rolling walker;Oxygen  Activity Tolerance: Patient limited by fatigue;Patient tolerated treatment well Patient left: in chair;with call bell/phone within reach  OT Visit Diagnosis: Unsteadiness on feet (R26.81);Muscle weakness (generalized) (M62.81)                Time: 1305-1330 OT Time Calculation (min): 25 min Charges:  OT General Charges $OT Visit: 1 Visit OT Evaluation $OT Eval Low Complexity: 1 Low OT Treatments $Self Care/Home Management : 8-22 mins  Tyrone Schimke, OT Acute Rehabilitation Services Pager: [phone] Office: [phone]   Hortencia Pilar 07/31/2018, 1:57 PM

## 2018-07-31 NOTE — Progress Notes (Signed)
Referring Physician(s): Dr Su Ley  Supervising Physician: Jacqulynn Cadet  Patient Status:  [name] - In-pt  Chief Complaint:  Rt pigtail chest tube placed in IR 1/7   Subjective:  Up in chair eating reg diet Feels better Chest tube intact No air leak  CXR yesterday:  IMPRESSION: Stable cardiomegaly. Stable position of right-sided pigtail drainage catheter in right lung base with stable right basilar hydropneumothorax. Stable left basilar opacity as described above.   Allergies: Butalbital-apap-caffeine; Ferrlecit [na ferric gluc cplx in sucrose]; Minoxidil; Tylenol [acetaminophen]; and Darvocet [propoxyphene n-acetaminophen]  Medications: Prior to Admission medications   Medication Sig Start Date End Date Taking? Authorizing Provider  ALPRAZolam [name]) 0.5 MG tablet Take 0.5 tablets (0.25 mg total) by mouth 2 (two) times daily as needed for anxiety or sleep. 05/15/18  Yes Emokpae, Courage, MD  amLODipine (NORVASC) 5 MG tablet Take 5 mg by mouth daily. 02/28/18  Yes [provider]  apixaban (ELIQUIS) 5 MG TABS tablet Take 5 mg by mouth 2 (two) times daily. 04/16/18  Yes [provider]  B Complex-C-Folic Acid (NEPHRO VITAMINS) 0.8 MG TABS Take 1 tablet by mouth daily. 03/12/18  Yes [provider]  busPIRone (BUSPAR) 5 MG tablet Take 5 mg by mouth 2 (two) times daily. 04/16/18  Yes [provider]  cinacalcet (SENSIPAR) 90 MG tablet Take 90 mg by mouth every evening.  04/03/18  Yes [provider]  diclofenac sodium (VOLTAREN) 1 % GEL Apply 4 gram transdermaly four times daily for left shoulder pain 02/27/18  Yes [provider]  diphenhydrAMINE (BENADRYL) 25 mg capsule Take 25 mg by mouth every 6 (six) hours as needed for itching. 07/10/18  Yes [provider]  hydrALAZINE (APRESOLINE) 100 MG tablet Take 100 mg by mouth 3 (three) times daily. 02/27/18  Yes [provider]  lanthanum (FOSRENOL) 1000 MG  chewable tablet Chew 1,000 mg by mouth 3 (three) times daily with meals.   Yes [provider]  lipase/protease/amylase (CREON) 12000 units CPEP capsule Take 1 capsule (12,000 Units total) by mouth 3 (three) times daily before meals. 05/15/18  Yes Emokpae, Courage, MD  multivitamin (RENA-VIT) TABS tablet Take 1 tablet by mouth daily.   Yes [provider]  nitroGLYCERIN (NITROSTAT) 0.4 MG SL tablet Place 0.4 mg under the tongue every 5 (five) minutes as needed for chest pain. 02/27/18  Yes [provider]  Nutritional Supplements (NUTRITIONAL SUPPLEMENT PO) Liberalized Renal Diet - Regular Texture   Yes [provider]  ondansetron (ZOFRAN-ODT) 4 MG disintegrating tablet Take 4 mg by mouth every 8 (eight) hours as needed for nausea/vomiting. 07/14/18  Yes [provider]  Oxycodone HCl 10 MG TABS Take 10 mg by mouth 6 (six) times daily.   Yes [provider]  OXYGEN O2 via nasal cannula at 2L/min for O2 sats 90% or below as needed for shortness of breath   Yes [provider]  pantoprazole (PROTONIX) 40 MG tablet Take 1 tablet (40 mg total) by mouth daily. 02/18/18  Yes Hall, Carole N, DO  polyethylene glycol (MIRALAX / GLYCOLAX) packet Take 17 g by mouth daily. Patient taking differently: Take 17 g by mouth daily as needed for mild constipation.  05/15/18  Yes Emokpae, Courage, MD  senna-docusate (SENOKOT-S) 8.6-50 MG tablet Take 2 tablets by mouth at bedtime. Patient taking differently: Take 2 tablets by mouth at bedtime as needed for mild constipation.  05/15/18  Yes Roxan Hockey, MD  zolpidem (AMBIEN) 5 MG  tablet Take 1 tablet (5 mg total) by mouth at bedtime. 05/15/18  Yes Emokpae, Courage, MD  dicyclomine (BENTYL) 10 MG/5ML syrup Take 5 mLs by mouth 4 (four) times daily as needed for spasms. 04/16/18   [provider]  insulin aspart (NOVOLOG) 100 UNIT/ML injection Inject into the skin 2 (two) times daily before a meal.      [provider]     Vital Signs: BP (!) 150/86 (BP Location: Right Arm)   Pulse 70   Temp 97.6 F (36.4 C)   Resp 16   Ht _0  (1.88 m)   Wt 160 lb 7.9 oz (72.8 kg)   SpO2 100%   BMI 20.61 kg/m   Physical Exam Pulmonary:     Breath sounds: Examination of the right-middle field reveals decreased breath sounds. Examination of the right-lower field reveals decreased breath sounds. Decreased breath sounds present.  Skin:    General: Skin is warm and dry.     Comments: Right chest tube site is clean and dry NT no bleeding No air leak in Pleurvac Little OP in Pleurvac chamber    Neurological:     General: No focal deficit present.  Psychiatric:        Behavior: Behavior normal.     Imaging: Dg Chest 1 View  Result Date: 07/30/2018 CLINICAL DATA:  Hydropneumothorax. EXAM: CHEST  1 VIEW COMPARISON:  Radiograph of July 29, 2018. FINDINGS: Stable cardiomegaly. No pneumothorax is noted. Stable left basilar opacity is noted concerning for atelectasis or infiltrate with associated pleural effusion. Stable position of right-sided pigtail drainage catheter in right lung base. Stable right basilar hydropneumothorax is noted. Bony thorax is unremarkable. IMPRESSION: Stable cardiomegaly. Stable position of right-sided pigtail drainage catheter in right lung base with stable right basilar hydropneumothorax. Stable left basilar opacity as described above. Electronically Signed   By: Marijo Conception, M.D.   On: 07/30/2018 13:56   Dg Chest Port 1 View  Result Date: 07/29/2018 CLINICAL DATA:  Chest tube with chest pain EXAM: PORTABLE CHEST 1 VIEW COMPARISON:  07/25/2018 FINDINGS: Right base chest tube. There is small residual right hydropneumothorax. Cardiomegaly with pericardial effusion by CT. Small left pleural effusion. Vascular congestion. IMPRESSION: 1. Stable basal hydropneumothorax on the right. 2. Cardiomegaly and pericardial effusion. Electronically Signed   By: Monte Fantasia  M.D.   On: 07/29/2018 07:13   Ct Image Guided Drainage By Percutaneous Catheter  Result Date: 07/28/2018 INDICATION: 53 year old male with a history of prior right-sided decortication and recurrent hydropneumothorax. He has been referred for chest tube placement in attempt to collapse the potential space. EXAM: CT GUIDED RIGHT CHEST TUBE PLACEMENT MEDICATIONS: The patient is currently admitted to the hospital and receiving intravenous antibiotics. The antibiotics were administered within an appropriate time frame prior to the initiation of the procedure. ANESTHESIA/SEDATION: 1.5 mg IV Versed 75 mcg IV Fentanyl Moderate Sedation Time:  29 minutes The patient was continuously monitored during the procedure by the interventional radiology nurse under my direct supervision. COMPLICATIONS: None TECHNIQUE: Informed written consent was obtained from the patient after a thorough discussion of the procedural risks, benefits and alternatives. All questions were addressed. Maximal Sterile Barrier Technique was utilized including caps, mask, sterile gowns, sterile gloves, sterile drape, hand hygiene and skin antiseptic. A timeout was performed prior to the initiation of the procedure. PROCEDURE: Patient was positioned left decubitus. Scout CT of the chest performed. The lower right chest was prepped with chlorhexidine in a sterile fashion, and a sterile  drape was applied covering the operative field. A sterile gown and sterile gloves were used for the procedure. Local anesthesia was provided with 1% Lidocaine. Using CT guidance, 18 gauge trocar needle was advanced into the complex fluid collection of the lower right chest. Modified Seldinger technique was used to place a 10 [place] drain into the pleural space. Catheter was attached to water seal aspiration chamber and sutured in position. Final images were stored and sent to PACs. Catheter was sutured in position. Patient tolerated procedure well and remained hemodynamically  stable throughout. No complications were encountered and no significant blood loss. FINDINGS: CT images of the chest demonstrate pleural thickening on the right with complex fluid collection and multiple loculations. Ten [place] drain terminates within the costophrenic sulcus within the fluid collection. IMPRESSION: Status post CT-guided right thoracostomy tube placement. Signed, Dulcy Fanny. Dellia Nims, RPVI [facility] Radiology Electronically Signed   By: Corrie Mckusick D.O.   On: 07/28/2018 16:25    Labs:  CBC: Recent Labs    07/28/18 0443 07/29/18 0404 07/30/18 1204 07/31/18 0633  WBC 4.5 4.3 1.4* 6.0  HGB 7.4* 7.9* 8.4* 8.6*  HCT 24.4* 26.3* 27.5* 27.9*  PLT 239 199 187 173    COAGS: Recent Labs    09/21/17 0919 01/15/18 04/26/18 1043 07/28/18 0443  INR 1.56 1.2* 1.36 1.39    BMP: Recent Labs    07/25/18 1135 07/26/18 0616 07/28/18 0443 07/30/18 1204  NA 135 135 135 134*  K 4.4 4.2 4.9 4.5  CL 95* 94* 93* 97*  CO2 _0 GLUCOSE 88 103* 76 117*  BUN 25* 31* 46* 26*  CALCIUM 9.3 9.0 8.0* 7.8*  CREATININE 9.74* 11.76* 14.59* 10.49*  GFRNONAA 5* 4* 3* 5*  GFRAA 6* 5* 4* 6*    LIVER FUNCTION TESTS: Recent Labs    06/09/18 1943 06/13/18 0655 06/22/18 0630 06/24/18 1427 07/26/18 0616 07/28/18 0443 07/30/18 1204  BILITOT 0.8 0.7 0.9  --  1.1  --   --   AST _1 --  19  --   --   ALT _2 --  6  --   --   ALKPHOS 122 121 122  --  92  --   --   PROT 7.6 7.8 8.6*  --  7.8  --   --   ALBUMIN 2.5* 2.6* 2.8* 2.4* 2.1* 2.2* 2.1*    Assessment and Plan:  Rt chest tube intact No air leak Will follow Plan per Dr Prescott Gum  Electronically Signed: Lavonia Drafts, PA-C 07/31/2018, 1:47 PM   I spent a total of 15 Minutes at the the patient's bedside AND on the patient's hospital floor or unit, greater than 50% of which was counseling/coordinating care for right Chest tube

## 2018-08-01 ENCOUNTER — Inpatient Hospital Stay (HOSPITAL_COMMUNITY): Payer: Medicare Other

## 2018-08-01 LAB — CBC
HCT: 28.7 % — ABNORMAL LOW (ref 39.0–52.0)
Hemoglobin: 8.8 g/dL — ABNORMAL LOW (ref 13.0–17.0)
MCH: 25 pg — ABNORMAL LOW (ref 26.0–34.0)
MCHC: 30.7 g/dL (ref 30.0–36.0)
MCV: 81.5 fL (ref 80.0–100.0)
Platelets: 182 10*3/uL (ref 150–400)
RBC: 3.52 MIL/uL — AB (ref 4.22–5.81)
RDW: 18.8 % — ABNORMAL HIGH (ref 11.5–15.5)
WBC: 6 10*3/uL (ref 4.0–10.5)
nRBC: 0 % (ref 0.0–0.2)

## 2018-08-01 LAB — RENAL FUNCTION PANEL
Albumin: 2.2 g/dL — ABNORMAL LOW (ref 3.5–5.0)
Anion gap: 13 (ref 5–15)
BUN: 19 mg/dL (ref 6–20)
CALCIUM: 7.2 mg/dL — AB (ref 8.9–10.3)
CO2: 25 mmol/L (ref 22–32)
Chloride: 94 mmol/L — ABNORMAL LOW (ref 98–111)
Creatinine, Ser: 8.96 mg/dL — ABNORMAL HIGH (ref 0.61–1.24)
GFR calc non Af Amer: 6 mL/min — ABNORMAL LOW (ref >60)
GFR, EST AFRICAN AMERICAN: 7 mL/min — AB (ref >60)
Glucose, Bld: 85 mg/dL (ref 70–99)
Phosphorus: 3.2 mg/dL (ref 2.5–4.6)
Potassium: 4.2 mmol/L (ref 3.5–5.1)
Sodium: 132 mmol/L — ABNORMAL LOW (ref 135–145)

## 2018-08-01 MED ORDER — PENTAFLUOROPROP-TETRAFLUOROETH EX AERO: 1.0000 "application " | INHALATION_SPRAY | CUTANEOUS | Status: DC | PRN

## 2018-08-01 MED ORDER — ALTEPLASE 2 MG IJ SOLR: 2.0000 mg | Freq: Once | INTRAMUSCULAR | Status: DC | PRN

## 2018-08-01 MED ORDER — HYDROMORPHONE HCL 1 MG/ML IJ SOLN
1.0000 mg | Freq: Once | INTRAMUSCULAR | Status: AC
  Administered 2018-08-01: 1 mg via INTRAVENOUS

## 2018-08-01 MED ORDER — HEPARIN SODIUM (PORCINE) 1000 UNIT/ML DIALYSIS
1000.0000 [IU] | INTRAMUSCULAR | Status: DC | PRN
  Filled 2018-08-01: qty 1

## 2018-08-01 MED ORDER — LIDOCAINE HCL (PF) 1 % IJ SOLN: 5.0000 mL | INTRAMUSCULAR | Status: DC | PRN

## 2018-08-01 MED ORDER — LIDOCAINE HCL (PF) 1 % IJ SOLN
5.0000 mL | INTRAMUSCULAR | Status: DC | PRN
  Filled 2018-08-01: qty 5

## 2018-08-01 MED ORDER — SODIUM CHLORIDE 0.9 % IV SOLN: 100.0000 mL | INTRAVENOUS | Status: DC | PRN

## 2018-08-01 MED ORDER — HEPARIN SODIUM (PORCINE) 1000 UNIT/ML DIALYSIS
1000.0000 [IU] | INTRAMUSCULAR | Status: DC | PRN
  Filled 2018-08-01 (×2): qty 1

## 2018-08-01 MED ORDER — HYDROMORPHONE HCL 1 MG/ML IJ SOLN
INTRAMUSCULAR | Status: AC
  Filled 2018-08-01: qty 1

## 2018-08-01 MED ORDER — LIDOCAINE-PRILOCAINE 2.5-2.5 % EX CREA
1.0000 "application " | TOPICAL_CREAM | CUTANEOUS | Status: DC | PRN
  Filled 2018-08-01: qty 5

## 2018-08-01 MED ORDER — ALTEPLASE 2 MG IJ SOLR
2.0000 mg | Freq: Once | INTRAMUSCULAR | Status: DC | PRN
  Filled 2018-08-01: qty 2

## 2018-08-01 MED ORDER — LIDOCAINE-PRILOCAINE 2.5-2.5 % EX CREA: 1.0000 "application " | TOPICAL_CREAM | CUTANEOUS | Status: DC | PRN

## 2018-08-01 NOTE — Progress Notes (Addendum)
[facility] Progress Note   Subjective:   Seen and examined at bedside.  Feeling ok today. No acute events overnight.  Dryness of skin improved with lotion.  No new complaints.  Objective Vitals:   07/31/18 0536 07/31/18 1406 07/31/18 2056 08/01/18 0533  BP: (!) 150/86 (!) 143/86 (!) 148/90 (!) 150/85  Pulse: 70 80 89 81  Resp: _0 Temp: 97.6 F (36.4 C) 97.9 F (36.6 C) 98.1 F (36.7 C) 97.9 F (36.6 C)  TempSrc:  Oral Oral Oral  SpO2: 100% 100% 96% 96%  Weight: 72.8 kg     Height:       Physical Exam General:NAD, chronically ill appearing male, laying in bed Heart:RRR, pigtail drain in place in R chest  Lungs:nml WOB, mostly CTAB Abdomen:soft, NTND Extremities:trace LE edema Dialysis Access: LU AVF    Filed Weights   07/30/18 1340 07/30/18 1800 07/31/18 0536  Weight: 72.4 kg 70.4 kg 72.8 kg   No intake or output data in the 24 hours ending 08/01/18 0854  Additional Objective Labs: Basic Metabolic Panel: Recent Labs  Lab 07/26/18 0616 07/28/18 0443 07/30/18 1204  NA 135 135 134*  K 4.2 4.9 4.5  CL 94* 93* 97*  CO2 _1 GLUCOSE 103* 76 117*  BUN 31* 46* 26*  CREATININE 11.76* 14.59* 10.49*  CALCIUM 9.0 8.0* 7.8*  PHOS 6.7* 6.2* 4.3   Liver Function Tests: Recent Labs  Lab 07/26/18 0616 07/28/18 0443 07/30/18 1204  AST 19  --   --   ALT 6  --   --   ALKPHOS 92  --   --   BILITOT 1.1  --   --   PROT 7.8  --   --   ALBUMIN 2.1* 2.2* 2.1*   Recent Labs  Lab 07/25/18 1135  LIPASE 31   CBC: Recent Labs  Lab 07/26/18 0616 07/28/18 0443 07/29/18 0404 07/30/18 1204 07/31/18 0633  WBC 5.5 4.5 4.3 1.4* 6.0  NEUTROABS  --   --   --   --  3.3  HGB 8.3* 7.4* 7.9* 8.4* 8.6*  HCT 26.0* 24.4* 26.3* 27.5* 27.9*  MCV 80.7 80.0 80.4 81.8 81.8  PLT 250 239 199 187 173   Cardiac Enzymes: Recent Labs  Lab 07/25/18 2253 07/26/18 0616  TROPONINI <0.03 <0.03   CBG: Recent Labs  Lab 07/26/18 0532 07/26/18 0611  07/28/18 0908 07/28/18 1018 07/29/18 2350  GLUCAP 64* 93 75 106* 85   Studies/Results: Dg Chest 1 View  Result Date: 07/30/2018 CLINICAL DATA:  Hydropneumothorax. EXAM: CHEST  1 VIEW COMPARISON:  Radiograph of July 29, 2018. FINDINGS: Stable cardiomegaly. No pneumothorax is noted. Stable left basilar opacity is noted concerning for atelectasis or infiltrate with associated pleural effusion. Stable position of right-sided pigtail drainage catheter in right lung base. Stable right basilar hydropneumothorax is noted. Bony thorax is unremarkable. IMPRESSION: Stable cardiomegaly. Stable position of right-sided pigtail drainage catheter in right lung base with stable right basilar hydropneumothorax. Stable left basilar opacity as described above. Electronically Signed   By: Marijo Conception, M.D.   On: 07/30/2018 13:56    Medications:  . amLODipine  5 mg Oral Daily  . apixaban  5 mg Oral Q12H  . busPIRone  5 mg Oral BID  . Chlorhexidine Gluconate Cloth  6 each Topical Q0600  . cinacalcet  90 mg Oral QPM  . feeding supplement (NEPRO CARB STEADY)  237 mL Oral BID BM  . hydrALAZINE  100 mg Oral TID  . lanthanum  1,000 mg Oral TID WC  . lipase/protease/amylase  12,000 Units Oral TID AC  . multivitamin  1 tablet Oral QHS  . pantoprazole  40 mg Oral Daily  . sodium chloride flush  5 mL Intracatheter Q8H  . zolpidem  5 mg Oral QHS    Dialysis Orders: [facility] MWF  4h 400/800 74kg 2/2.25 bath Hep none LUA AVF No heparin  -Hectorol 30mg IV TIW -Venofer 1027mIV x 10 (started 1/3)  -Mircera 225 mcg IV 2 weeks (last 1/3)  Assessment/Plan: 1.CP/Dyspnea - Rrecurrentpleural effusion:s/p thoracotomy/ drainage and decortication12/2019at [facility]. Returns w/ R chest pain and PTX, now spR pigtail drain placed 07/28/18 by IR.Cultures NGTD.CXR1/8showed small R residual hydropneumothorax.Per primary/IR. 2. ESRD -on HD MWF. last K 4.5. HD today off schedule, will return to regular schedule on  Monday. 3. Anemia of CKD-Hgb^ 8.6. ESA dosed as OP on 07/24/18. Follow trends.Will transfuse if Hgb<7.Checking labs pre HD. 4. Secondary hyperparathyroidism -Ca and phos in goal. Continue VDRA/binders/Sensipar. Follow trends. 5. HTN/volume -BPelevated.  Continue home meds. Does not appear grossly volume overloaded on exam.Titrate down volume as tolerated. Under EDW, will likely need new EDW at d/c. 6. Nutrition -Renal diet w/fluid restrictions, renal vitamins, protein supplements. Alb 2.1 7. Chronic pancreatitis/nausea/vomiting - per primary  LiJen MowPA-C [facility] Pager: [phone]/05/2019,8:54 AM  LOS: 6 days   Pt seen, examined and agree w A/P as above.  RoKelly SplinterD CaNewell Rubbermaidager [phone] 08/01/2018, 12:38 PM

## 2018-08-01 NOTE — Progress Notes (Signed)
Patient refusing lab to draw blood.  Patient to go to dialysis, so labs my be obtained there.

## 2018-08-01 NOTE — Progress Notes (Signed)
[name]: [phone]  Patient name: Frank [name] Medical record number: [id number] Date of birth: 05-17-1966 Age: 53 y.o. Gender: male  Primary Care Provider: Patient, [name] Consultants: Nephrology  Code Status: Full   Pt Overview and Major Events to Date:  Admitted to [facility] on 07/25/18 1/4: CXR: oculated right sided pleural effusion improved from last xray with possible extrapleural air component at right lung base seen on previous CT 12/2 1/5: CT: pleural effusion and pleural thickening in the R hemithorax with large loculated anterolateral hydropneumothroax in R chest, favoring recurrent empyema and surgery 1/7: Right pleural pigtail catheter placed by IR 1/8: CXR small residual right hydropneumothorax 1/10: PT/OT rec SNF, pt agreeable  Assessment and Plan: Frank [name] is a 53 y.o. male presenting with SOB, R-sided chest pain, nausea/vomiting, diarrhea. PMH is significant for right sided empyema with thoracotomy 06/2018, VTE on Eliquis, chronic pancreatitis.   Acute hypoxia without respiratory distress  Recurrent pleural effusions  Hydropneumothorax - Improving Pleural pigtail catheter placed by IR on 1/7 with minimal output.  Pleural cultures without growth.  CVTS following for recurrent pleural effusion.  Currently on supplemental oxygen with no increased work of breathing and stable vitals.  Patient refusing continuous pulse ox. - CVTS following, appreciate recs, repeat CXR, may remove pigtail if unchanged and would follow-up outpatient - IR following, appreciate recommendations - PT/OT consulted, recommend SNF, CSW consulted for placement - Continue Eliquis  - Continue PO Oxy 50m q4 hours PRN    - Lidocaine PRN for pain, topical voltaren gel PRN - Zofran PRN  ESRD (mwf): Nephrology following. Plans for HD 1/11. Plan to return to regular schedule next week. Appears euvolemic on exam with clear breath sounds. EDW 74kg.   - Nephrology consulted, appreciate recommendations - renal diet - Renal function panel and CBC pending HD since patient declined labs 1/10  H/o Chronic pancreatitis with acute N/V/Diarrea: resolved Denies any current symptoms. Tolerating PO this morning. Denies constipation.  Home meds: creon 12000 units TID, multivitamin daily, nutritional supplements.  - Continue to encourage oral hydration - I/O monitoring - monitor for constipation as patient is on opioids - Continue bentyl 14mQID PRN - continue creon TID before meals - continue multivitamin - nutrition consulted - recommend Nepro Shake BID  Anemia of chronic disease: stable stable. hgb range from 7-8. Transfusion threshold <7.0. - continue to monitor - nephrology following, appreciate recommendations  - Will follow-up Hgb if any signs of bleeding  Chronic pain requiring opioids Secondary to thoracotomy and chronic pancreatitis. [place] controlled substance database reviewed. Of note, patient has been seen by 8 different physicians within the last 6 months for opioids. Will limit opioid pain meds.  - Continue PO Oxy 107mx/day PRN - Of note, [facility] would like to be sole prescriber on opioids for patient during this admission  Depression/anxiety Home meds: alprazolam .5mg51mD PRN, buspar 5mg 15m, zolpidem 5mg Q78m- continue buspar - continue zolpidem - hold alprazolam (has not been filled since July 2019)  Chronic combined CHF home meds: nitrostat PRN - continue nitrostat PRN  HTN: stable Home meds: amlodipine 5mg da27m, hydralazine 100mg TI67mP this AM 133/79. Currently on home meds. - continue home amlodipine and hydralazine - monitor BP per floor protocol   Ichthyosis (unknown type)- diffuse cracked skin - moisturizers PRN  FEN/GI: renal diet + Nepro Shake BID, fluid restriction, Protonix, Miralax, Senokot Prophylaxis: Eliquis  Disposition: Pending removal of pigtail and  CVTS recs, anticipate discharge to  SNF, patient in agreement.  Subjective:  Patient states he is interested in SNF today.  He would not like to have the continuous pulse ox on but is open to  Objective: Temp:  [97.9 F (36.6 C)-98.1 F (36.7 C)] 97.9 F (36.6 C) (01/11 0533) Pulse Rate:  [80-89] 81 (01/11 0533) Resp:  [17-18] 17 (01/11 0533) BP: (143-150)/(85-90) 150/85 (01/11 0533) SpO2:  [96 %-100 %] 96 % (01/11 0533)  Physical Exam: General: comfortably lying in bed, well nourished, well developed, NAD with non-toxic appearance HEENT: normocephalic, atraumatic, moist mucous membranes Cardiovascular: regular rate and rhythm without murmurs, rubs, or gallops Lungs: clear to auscultation bilaterally with normal work of breathing, minimal output from pigtail Abdomen: soft, non-tender, non-distended, normoactive bowel sounds Skin: warm, dry, cap refill < 2 seconds, dressings intact on right chest wall without drainage, pigtail intact without erythema or drainage Extremities: warm and well perfused, normal tone, no edema   Laboratory: Recent Labs  Lab 07/29/18 0404 07/30/18 1204 07/31/18 0633  WBC 4.3 1.4* 6.0  HGB 7.9* 8.4* 8.6*  HCT 26.3* 27.5* 27.9*  PLT 199 187 173   Recent Labs  Lab 07/26/18 0616 07/28/18 0443 07/30/18 1204  NA 135 135 134*  K 4.2 4.9 4.5  CL 94* 93* 97*  CO2 _0 BUN 31* 46* 26*  CREATININE 11.76* 14.59* 10.49*  CALCIUM 9.0 8.0* 7.8*  PROT 7.8  --   --   BILITOT 1.1  --   --   ALKPHOS 92  --   --   ALT 6  --   --   AST 19  --   --   GLUCOSE 103* 76 117*     Imaging/Diagnostic Tests: Dg Chest 1 View  Result Date: 07/30/2018 CLINICAL DATA:  Hydropneumothorax. EXAM: CHEST  1 VIEW COMPARISON:  Radiograph of July 29, 2018. FINDINGS: Stable cardiomegaly. No pneumothorax is noted. Stable left basilar opacity is noted concerning for atelectasis or infiltrate with associated pleural effusion. Stable position of right-sided pigtail drainage catheter in right lung base. Stable  right basilar hydropneumothorax is noted. Bony thorax is unremarkable. IMPRESSION: Stable cardiomegaly. Stable position of right-sided pigtail drainage catheter in right lung base with stable right basilar hydropneumothorax. Stable left basilar opacity as described above. Electronically Signed   By: Marijo Conception, M.D.   On: 07/30/2018 13:56   Dg Chest 2 View  Result Date: 07/25/2018 CLINICAL DATA:  Chest pain. EXAM: CHEST - 2 VIEW COMPARISON:  Most recent chest radiograph 06/22/2018. Lung bases from abdominal CT 06/22/2018 FINDINGS: Decreased right pleural effusion from prior radiograph, partially loculated small volume of pleural fluid remains. Possible partially loculated extrapleural air at the right lung base, not well assessed radiographically. Patchy opacities throughout the right hemithorax. Cardiomegaly is unchanged. Mild interstitial edema suspected. IMPRESSION: 1. Decreased right pleural effusion from prior radiograph, residual partially loculated pleural effusion persists. Possible extrapleural air component at the right lung base, as seen from included lung bases on abdominal CT 06/22/2018. 2. Chronic cardiomegaly with mild interstitial edema. Patchy opacities throughout the right hemithorax may represent asymmetric edema or superimposed infection. Electronically Signed   By: Keith Rake M.D.   On: 07/25/2018 03:56   Ct Chest Wo Contrast  Result Date: 07/26/2018 CLINICAL DATA:  Shortness of breath; history CHF, end-stage renal disease on dialysis, non ischemic cardiomyopathy, hypertension, underwent thoracotomy for empyema in December 2019. EXAM: CT CHEST WITHOUT CONTRAST TECHNIQUE: Multidetector CT imaging of the chest  was performed following the standard protocol without IV contrast. IV contrast was not administered since no adequate IV access could be established. Sagittal and coronal MPR images reconstructed from axial data set. COMPARISON:  01/31/2018 CT chest Correlation: CT abdomen  and pelvis 06/22/2018 FINDINGS: Cardiovascular: Scattered atherosclerotic calcifications of aorta, coronary arteries and proximal great vessels. Enlargement of cardiac chambers. Small to moderate sized pericardial effusion slightly increased from previous exam. Upper normal caliber ascending thoracic aorta. Mediastinum/Nodes: Mildly enlarged precarinal lymph node 12 mm short axis image 64. Additional AP window node 14 mm short axis image 64. Normal sized RIGHT paratracheal and prevascular nodes. Base of cervical region normal appearance. Esophagus unremarkable. Lungs/Pleura: Central peribronchial thickening. Consolidation versus mass RIGHT lower lobe 4.7 x 3.8 cm image 111. Scattered pleural effusion and pleural thickening throughout lateral and posterior RIGHT hemithorax with loculated hydropneumothorax at the anterolateral RIGHT chest, likely representing residual or recurrent empyema in a patient with recent empyema. Basilar portion of RIGHT has been present since the prior CT exam from December 2019, though the cranial extent of this was not previously imaged. Compressive atelectasis in RIGHT upper and RIGHT middle lobes. Upper Abdomen: Atrophic upper kidneys. Probable cyst upper pole LEFT kidney 20 x 20 mm. Remaining visualized upper abdomen unremarkable. Musculoskeletal: Diffuse osseous sclerosis likely renal osteodystrophy in patient with history of renal failure. IMPRESSION: Pleural effusion and pleural thickening in the RIGHT hemithorax with a large loculated anterolateral hydropneumothorax in the RIGHT chest, favor recurrent empyema in patient with recent empyema and surgery; recommend correlation with fluid analysis. At least the basilar portion of the RIGHT hydropneumothorax has been present since December 2012 though full extent has not been previously imaged. Extensive peribronchial thickening with rounded area of opacity in the RIGHT lower lobe approximately 4.7 x 3.8 cm question rounded pneumonia  versus neoplasm. Enlargement of cardiac chambers with small to moderate-sized pericardial effusion increased since previous exam. Aortic Atherosclerosis (ICD10-I70.0). Findings called to the patient's nurse Susana RN on [facility] on 07/26/2018 at 0035 hrs. Electronically Signed   By: Lavonia Dana M.D.   On: 07/26/2018 00:47   Dg Chest Port 1 View  Result Date: 07/29/2018 CLINICAL DATA:  Chest tube with chest pain EXAM: PORTABLE CHEST 1 VIEW COMPARISON:  07/25/2018 FINDINGS: Right base chest tube. There is small residual right hydropneumothorax. Cardiomegaly with pericardial effusion by CT. Small left pleural effusion. Vascular congestion. IMPRESSION: 1. Stable basal hydropneumothorax on the right. 2. Cardiomegaly and pericardial effusion. Electronically Signed   By: Monte Fantasia M.D.   On: 07/29/2018 07:13   Ct Image Guided Drainage By Percutaneous Catheter  Result Date: 07/28/2018 INDICATION: 53 year old male with a history of prior right-sided decortication and recurrent hydropneumothorax. He has been referred for chest tube placement in attempt to collapse the potential space. EXAM: CT GUIDED RIGHT CHEST TUBE PLACEMENT MEDICATIONS: The patient is currently admitted to the hospital and receiving intravenous antibiotics. The antibiotics were administered within an appropriate time frame prior to the initiation of the procedure. ANESTHESIA/SEDATION: 1.5 mg IV Versed 75 mcg IV Fentanyl Moderate Sedation Time:  29 minutes The patient was continuously monitored during the procedure by the interventional radiology nurse under my direct supervision. COMPLICATIONS: None TECHNIQUE: Informed written consent was obtained from the patient after a thorough discussion of the procedural risks, benefits and alternatives. All questions were addressed. Maximal Sterile Barrier Technique was utilized including caps, mask, sterile gowns, sterile gloves, sterile drape, hand hygiene and skin antiseptic. A timeout was performed prior to  the initiation  of the procedure. PROCEDURE: Patient was positioned left decubitus. Scout CT of the chest performed. The lower right chest was prepped with chlorhexidine in a sterile fashion, and a sterile drape was applied covering the operative field. A sterile gown and sterile gloves were used for the procedure. Local anesthesia was provided with 1% Lidocaine. Using CT guidance, 18 gauge trocar needle was advanced into the complex fluid collection of the lower right chest. Modified Seldinger technique was used to place a 10 [place] drain into the pleural space. Catheter was attached to water seal aspiration chamber and sutured in position. Final images were stored and sent to PACs. Catheter was sutured in position. Patient tolerated procedure well and remained hemodynamically stable throughout. No complications were encountered and no significant blood loss. FINDINGS: CT images of the chest demonstrate pleural thickening on the right with complex fluid collection and multiple loculations. Ten [place] drain terminates within the costophrenic sulcus within the fluid collection. IMPRESSION: Status post CT-guided right thoracostomy tube placement. Signed, Dulcy Fanny. Dellia Nims, RPVI [facility] Radiology Electronically Signed   By: Corrie Mckusick D.O.   On: 07/28/2018 16:25    Winfield Bing, DO 08/01/2018, 8:46 AM PGY-3, [place] Intern pager: [phone], text pages welcome

## 2018-08-01 NOTE — Progress Notes (Addendum)
[address]       [place] [address]             [phone]         Subjective: Feels okay this morning. Pain is well controlled on the oral medication.   Objective: Vital signs in last 24 hours: Temp:  [97.9 F (36.6 C)-98.1 F (36.7 C)] 97.9 F (36.6 C) (01/11 0533) Pulse Rate:  [80-89] 81 (01/11 0533) Resp:  [17-18] 17 (01/11 0533) BP: (143-150)/(85-90) 150/85 (01/11 0533) SpO2:  [96 %-100 %] 96 % (01/11 0533)     Intake/Output from previous day: No intake/output data recorded. Intake/Output this shift: No intake/output data recorded.  General appearance: alert, cooperative and no distress Heart: regular rate and rhythm, S1, S2 normal, no murmur, click, rub or gallop Lungs: clear to auscultation bilaterally Abdomen: soft, non-tender; bowel sounds normal; no masses,  no organomegaly Extremities: extremities normal, atraumatic, no cyanosis or edema Wound: pigtail has good placement  Lab Results: Recent Labs    07/30/18 1204 07/31/18 0633  WBC 1.4* 6.0  HGB 8.4* 8.6*  HCT 27.5* 27.9*  PLT 187 173   BMET:  Recent Labs    07/30/18 1204  NA 134*  K 4.5  CL 97*  CO2 26  GLUCOSE 117*  BUN 26*  CREATININE 10.49*  CALCIUM 7.8*    PT/INR: No results for input(s): LABPROT, INR in the last 72 hours. ABG    Component Value Date/Time   HCO3 19.3 (L) 12/31/2014 0801   TCO2 31 06/15/2018 2129   ACIDBASEDEF 7.0 (H) 12/31/2014 0801   O2SAT 45.0 12/31/2014 0801   CBG (last 3)  Recent Labs    07/29/18 2350  GLUCAP 85    Assessment/Plan: S/P pigtail cath placement 1. Slight increase in right-side hydropneumothorax. Will keep on water seal for now since there is no air leak and the patient is asymptomatic. On 1L [place] but had been on room air.  2. Cultures remains negative, no growth x 3 days. 3. No fevers, No new labs 4. Hypertension-care per primary 5. ESRD-on HD, followed by Nephrology  Plan: Keep on water seal today. CXR in the  morning. If stable, will d/c pigtail in the am.    LOS: 6 days    Elgie Collard 08/01/2018 Patient seen and examined, agree with above  Remo Lipps C. Roxan Hockey, MD [facility] [phone]

## 2018-08-01 NOTE — Progress Notes (Signed)
Patient refused dressing change at this time. Will report to the day shift nurse.  Will continue to monitor the patient and notify as needed

## 2018-08-02 ENCOUNTER — Inpatient Hospital Stay (HOSPITAL_COMMUNITY): Payer: Medicare Other

## 2018-08-02 LAB — RENAL FUNCTION PANEL
ALBUMIN: 2.2 g/dL — AB (ref 3.5–5.0)
Anion gap: 11 (ref 5–15)
BUN: 12 mg/dL (ref 6–20)
CO2: 27 mmol/L (ref 22–32)
Calcium: 7.5 mg/dL — ABNORMAL LOW (ref 8.9–10.3)
Chloride: 94 mmol/L — ABNORMAL LOW (ref 98–111)
Creatinine, Ser: 6.46 mg/dL — ABNORMAL HIGH (ref 0.61–1.24)
GFR calc Af Amer: 10 mL/min — ABNORMAL LOW (ref >60)
GFR, EST NON AFRICAN AMERICAN: 9 mL/min — AB (ref >60)
Glucose, Bld: 112 mg/dL — ABNORMAL HIGH (ref 70–99)
Phosphorus: 2.8 mg/dL (ref 2.5–4.6)
Potassium: 3.9 mmol/L (ref 3.5–5.1)
Sodium: 132 mmol/L — ABNORMAL LOW (ref 135–145)

## 2018-08-02 LAB — CBC
HCT: 29.2 % — ABNORMAL LOW (ref 39.0–52.0)
Hemoglobin: 8.7 g/dL — ABNORMAL LOW (ref 13.0–17.0)
MCH: 24 pg — ABNORMAL LOW (ref 26.0–34.0)
MCHC: 29.8 g/dL — AB (ref 30.0–36.0)
MCV: 80.4 fL (ref 80.0–100.0)
NRBC: 0 % (ref 0.0–0.2)
Platelets: 162 10*3/uL (ref 150–400)
RBC: 3.63 MIL/uL — ABNORMAL LOW (ref 4.22–5.81)
RDW: 18.5 % — AB (ref 11.5–15.5)
WBC: 6.1 10*3/uL (ref 4.0–10.5)

## 2018-08-02 MED ORDER — DARBEPOETIN ALFA 200 MCG/0.4ML IJ SOSY: 200.0000 ug | PREFILLED_SYRINGE | INTRAMUSCULAR | Status: DC

## 2018-08-02 MED ORDER — HYDROMORPHONE HCL 1 MG/ML IJ SOLN
1.0000 mg | Freq: Once | INTRAMUSCULAR | Status: AC
  Administered 2018-08-02: 1 mg via INTRAMUSCULAR
  Filled 2018-08-02: qty 1

## 2018-08-02 MED ORDER — APIXABAN 5 MG PO TABS
5.0000 mg | ORAL_TABLET | Freq: Two times a day (BID) | ORAL | Status: DC
  Administered 2018-08-02 – 2018-08-05 (×7): 5 mg via ORAL
  Filled 2018-08-02 (×8): qty 1

## 2018-08-02 MED ORDER — CHLORHEXIDINE GLUCONATE CLOTH 2 % EX PADS: 6.0000 | MEDICATED_PAD | Freq: Every day | CUTANEOUS | Status: DC

## 2018-08-02 NOTE — Progress Notes (Signed)
Patient compliant with medication regimen, assessment completed. Had flat affect but did stress he wasn't feeling well. CT in place to h20 seal, C/O right subclavian pain on scale states 10/10.

## 2018-08-02 NOTE — Progress Notes (Addendum)
[address]       [place] [address]             [phone]         Subjective: No issues overnight. Excited to get his chest tube out today  Objective: Vital signs in last 24 hours: Temp:  [97.9 F (36.6 C)-99.3 F (37.4 C)] 98.4 F (36.9 C) (01/12 0604) Pulse Rate:  [74-99] 86 (01/12 0604) Resp:  [16-19] 19 (01/12 0604) BP: (131-161)/(75-96) 142/96 (01/12 0604) SpO2:  [94 %-97 %] 97 % (01/12 0604) Weight:  [70.9 kg-73.8 kg] 70.9 kg (01/11 1742)     Intake/Output from previous day: 01/11 0701 - 01/12 0700 In: 1180 [P.O.:1180] Out: 2900  Intake/Output this shift: No intake/output data recorded.  General appearance: alert, cooperative and no distress Heart: regular rate and rhythm, S1, S2 normal, no murmur, click, rub or gallop Lungs: clear to auscultation bilaterally Abdomen: soft, non-tender; bowel sounds normal; no masses,  no organomegaly Extremities: upper and lower extremity edema 1+ Wound: clean and dry  Lab Results: Recent Labs    07/31/18 0633 08/01/18 1402  WBC 6.0 6.0  HGB 8.6* 8.8*  HCT 27.9* 28.7*  PLT 173 182   BMET:  Recent Labs    07/30/18 1204 08/01/18 1402  NA 134* 132*  K 4.5 4.2  CL 97* 94*  CO2 26 25  GLUCOSE 117* 85  BUN 26* 19  CREATININE 10.49* 8.96*  CALCIUM 7.8* 7.2*    PT/INR: No results for input(s): LABPROT, INR in the last 72 hours. ABG    Component Value Date/Time   HCO3 19.3 (L) 12/31/2014 0801   TCO2 31 06/15/2018 2129   ACIDBASEDEF 7.0 (H) 12/31/2014 0801   O2SAT 45.0 12/31/2014 0801   CBG (last 3)  No results for input(s): GLUCAP in the last 72 hours.  Assessment/Plan: S/p pigtail catheter placement  1. CXR this morning is stable. Will remove pigtail catheter this morning. CXR tomorrow morning.  2. Hypertension-improved. Care per primary 3. No fevers, Tmax 99.3, no labs today last WBC 6.1 4. ESRD-on HD per Nephrology 5. Cultures no growth x 4 days  Plan: remove pigtail  catheter. CXR in the morning.    LOS: 7 days    Elgie Collard 08/02/2018 Agree with above CXR unchanged- Dc chest drain  Remo Lipps C. Roxan Hockey, MD [facility] [phone]

## 2018-08-02 NOTE — Plan of Care (Signed)
  Problem: Education: Goal: Knowledge of General Education information will improve Description Including pain rating scale, medication(s)/side effects and non-pharmacologic comfort measures Outcome: Progressing   Problem: Health Behavior/Discharge Planning: Goal: Ability to manage health-related needs will improve Outcome: Progressing   Problem: Pain Managment: Goal: General experience of comfort will improve Outcome: Progressing   Problem: Skin Integrity: Goal: Risk for impaired skin integrity will decrease Outcome: Progressing

## 2018-08-02 NOTE — Progress Notes (Signed)
[address]       [place] [address]             [phone]       Right pigtail catheter removed in a routine fashion and without issue.    Complications: None. Patient tolerated well.    Tessa Conte,PA-C

## 2018-08-02 NOTE — Discharge Summary (Deleted)
[facility] Discharge Summary  Patient name: Frank [name] Medical record number: [id number] Date of birth: May 29, 1966 Age: 53 y.o. Gender: male Date of Admission: 07/25/2018  Date of Discharge: 08/02/2018 Admitting Physician: Richarda Osmond, DO  Primary Care Provider: Patient, [name] Consultants: Nephrology, IR, CVTS  Indication for Hospitalization: Right sided chest pain, SOB  Discharge Diagnoses/Problem List:  Patient Active Problem List   Diagnosis Date Noted  . Malnutrition of moderate degree 07/29/2018  . Chest tube in place   . Chronic, continuous use of opioids 07/28/2018  . Chest pain   . Chronic vomiting 07/26/2018  . Empyema of right pleural space ([place]) 07/26/2018  . Chronic pancreatitis ([place]) 05/09/2018  . Dialysis patient, noncompliant ([place]) 03/05/2018  . Hydropneumothorax 01/31/2018  . Benign hypertensive heart and kidney disease with systolic CHF, NYHA class 3 and CKD stage 5 ([place])   . End-stage renal disease on hemodialysis ([place])   . DM (diabetes mellitus), type 2, uncontrolled, with renal complications ([place])   . History of pulmonary embolism 05/08/2014  . Complex sleep apnea syndrome 05/05/2014  . Anemia of chronic kidney failure 06/24/2013  . Nausea vomiting and diarrhea 06/24/2013   Disposition: SNF  Discharge Condition: Improved and stable  Discharge Exam: (from progress note on day of discharge) General: well nourished, well developed, in no acute distress with non-toxic appearance, sitting up eating breakfast  HEENT: normocephalic, atraumatic, moist mucous membranes CV: regular rate and rhythm without murmurs, rubs, or gallops, no lower extremity edema Lungs: clear to auscultation bilaterally with normal work of breathing Abdomen: soft, non-tender, non-distended, normoactive bowel sounds Skin: warm, dry, no rashes or lesions Extremities: warm and well perfused Neuro: Alert and oriented, speech normal  Brief Hospital  Course:  Frank [name] is a 53 y.o. male with past medical history significant for ESRD on HD MWF, HTN, chronic pancreatitis, anemia of chronic disease,CHF, depression/anxiety, and chronic pain with chronic opioid use, who presented with worsening right sided chest pain and hypoxia and found to have right pleural effusion and loculated hydropneumothorax. Of note patient was status-post thoracotomy for a right sided empyema and decortication of collapsed RLL on 06/2018 with 10 day course of zosyn at [facility].    Cardiac and thoracic surgery was consulted in the ED and felt hydropnuemothorax was most likely residual space from entrapped lung of lower lobe that failed to re-expand from previous admission. Patient had CT guided right pleural pigtail catheter placed with chest tube and suction on 1/7. Fluid was drained and fluid culture was collected (negative x 5 days). Upon adequate drainage and improvement on chest x-ray, chest tube was removed. Repeat chest x-rays were stable without evidence of infection. 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 No antibiotics were initiated as patient presented with normal white count remained hemodynamically stable and afebrile throughout entire admission. He initially required oxygen to maintain saturations, but by time of discharge he was stable on room air with normal work of breathing.  CT scan on admission also noted rounded opacity in RLL with question of neoplasm. Given this finding,n patient will likely need repeat CT for further evaluation. Will defer further management to PCP.   Patient has a chronic history of pain requiring opioids expected to be secondary to thoracotomy and chronic pancreatitis. Upon review of the [place] controlled substance database, it was noted patient had been  seen by 8 different physicians within the last 6 months for  opioids. Pain management was closely monitored and he was maintained on his outpatient pain regimen only (oxycodone IR 32m QID PRN). Of note, patient would refuse hemodialysis sessions throughout admission unless he received IV dilaudid. This was granted given concern for development of hypervolemia.   At time of discharge, patient was hemodyanmicaly stable on room air and afebrile. Return precautions discussed and follow-up arranged.   Issues for Follow Up:  1. Follow up possible rounded opacity in RLL found on CT chest, consider repeat CT scan  2. Recommend monitoring pain management and opioid use closely 3. CVTS to follow up recurrent R sided pleural effusion outpatient  Significant Procedures:  CT Chest without contrast: pleural effusion and pleural thickening in the R hemithorax with large loculated anterolateral hydropneumothroax in R chest, favoring recurrent empyema; Rounded opacity in RLL ~4.7x3.8, question rounded PNA vs neoplasm CT guided placement of right pleural pigtail catheter and chest tube  Significant Labs and Imaging:  Recent Labs  Lab 08/01/18 1402 08/02/18 0938 08/04/18 1301  WBC 6.0 6.1 6.1  HGB 8.8* 8.7* 8.7*  HCT 28.7* 29.2* 28.9*  PLT 182 162 201   Recent Labs  Lab 07/30/18 1204 08/01/18 1402 08/02/18 0938 08/03/18 1513 08/04/18 1301  NA 134* 132* 132* 134* 135  K 4.5 4.2 3.9 4.1 4.5  CL 97* 94* 94* 97* 97*  CO2 _0 GLUCOSE 117* 85 112* 100* 82  BUN 26* _1 29*  CREATININE 10.49* 8.96* 6.46* 8.06* 9.81*  CALCIUM 7.8* 7.2* 7.5* 7.6* 7.9*  PHOS 4.3 3.2 2.8  --  3.3  ALBUMIN 2.1* 2.2* 2.2*  --  2.1*   Lipase: 31 Troponin: <0.03 PT/INR: 16.9/1.39 Hep B surface Ag: negative Fluid Gram stain: negative Fluid culture: NG x 5 days  Dg Chest 1 View  Result Date: 07/30/2018 CLINICAL DATA:  Hydropneumothorax. EXAM: CHEST  1 VIEW COMPARISON:  Radiograph of July 29, 2018. FINDINGS: Stable cardiomegaly. No pneumothorax is noted. Stable  left basilar opacity is noted concerning for atelectasis or infiltrate with associated pleural effusion. Stable position of right-sided pigtail drainage catheter in right lung base. Stable right basilar hydropneumothorax is noted. Bony thorax is unremarkable. IMPRESSION: Stable cardiomegaly. Stable position of right-sided pigtail drainage catheter in right lung base with stable right basilar hydropneumothorax. Stable left basilar opacity as described above. Electronically Signed   By: JMarijo Conception M.D.   On: 07/30/2018 13:56   Dg Chest 2 View  Result Date: 08/04/2018 CLINICAL DATA:  Follow-up RIGHT hydropneumothorax. EXAM: CHEST - 2 VIEW COMPARISON:  08/03/2018 and earlier. FINDINGS: Cardiac silhouette markedly enlarged, unchanged. RIGHT apicolateral pneumothorax and likely loculated RIGHT basilar hydropneumothorax, unchanged since yesterday. Pulmonary venous hypertension without overt edema, unchanged since yesterday. Consolidation in the RIGHT LOWER LOBE, unchanged. No new pulmonary parenchymal abnormalities. IMPRESSION: 1. Stable RIGHT apicolateral pneumothorax and likely loculated RIGHT basilar hydropneumothorax compared to yesterday. 2. Stable atelectasis and/or pneumonia in the RIGHT LOWER LOBE. 3. Stable marked cardiomegaly without pulmonary edema. 4. No new abnormalities. Electronically Signed   By: TEvangeline DakinM.D.   On: 08/04/2018 09:09   Dg Chest 2 View  Result Date: 07/25/2018 CLINICAL DATA:  Chest pain. EXAM: CHEST - 2 VIEW COMPARISON:  Most recent chest radiograph 06/22/2018. Lung bases from abdominal CT 06/22/2018 FINDINGS: Decreased right pleural effusion from prior radiograph, partially loculated small volume of pleural fluid remains. Possible partially loculated extrapleural air at the  right lung base, not well assessed radiographically. Patchy opacities throughout the right hemithorax. Cardiomegaly is unchanged. Mild interstitial edema suspected. IMPRESSION: 1. Decreased right  pleural effusion from prior radiograph, residual partially loculated pleural effusion persists. Possible extrapleural air component at the right lung base, as seen from included lung bases on abdominal CT 06/22/2018. 2. Chronic cardiomegaly with mild interstitial edema. Patchy opacities throughout the right hemithorax may represent asymmetric edema or superimposed infection. Electronically Signed   By: Keith Rake M.D.   On: 07/25/2018 03:56   Ct Chest Wo Contrast  Result Date: 07/26/2018 CLINICAL DATA:  Shortness of breath; history CHF, end-stage renal disease on dialysis, non ischemic cardiomyopathy, hypertension, underwent thoracotomy for empyema in December 2019. EXAM: CT CHEST WITHOUT CONTRAST TECHNIQUE: Multidetector CT imaging of the chest was performed following the standard protocol without IV contrast. IV contrast was not administered since no adequate IV access could be established. Sagittal and coronal MPR images reconstructed from axial data set. COMPARISON:  01/31/2018 CT chest Correlation: CT abdomen and pelvis 06/22/2018 FINDINGS: Cardiovascular: Scattered atherosclerotic calcifications of aorta, coronary arteries and proximal great vessels. Enlargement of cardiac chambers. Small to moderate sized pericardial effusion slightly increased from previous exam. Upper normal caliber ascending thoracic aorta. Mediastinum/Nodes: Mildly enlarged precarinal lymph node 12 mm short axis image 64. Additional AP window node 14 mm short axis image 64. Normal sized RIGHT paratracheal and prevascular nodes. Base of cervical region normal appearance. Esophagus unremarkable. Lungs/Pleura: Central peribronchial thickening. Consolidation versus mass RIGHT lower lobe 4.7 x 3.8 cm image 111. Scattered pleural effusion and pleural thickening throughout lateral and posterior RIGHT hemithorax with loculated hydropneumothorax at the anterolateral RIGHT chest, likely representing residual or recurrent empyema in a  patient with recent empyema. Basilar portion of RIGHT has been present since the prior CT exam from December 2019, though the cranial extent of this was not previously imaged. Compressive atelectasis in RIGHT upper and RIGHT middle lobes. Upper Abdomen: Atrophic upper kidneys. Probable cyst upper pole LEFT kidney 20 x 20 mm. Remaining visualized upper abdomen unremarkable. Musculoskeletal: Diffuse osseous sclerosis likely renal osteodystrophy in patient with history of renal failure. IMPRESSION: Pleural effusion and pleural thickening in the RIGHT hemithorax with a large loculated anterolateral hydropneumothorax in the RIGHT chest, favor recurrent empyema in patient with recent empyema and surgery; recommend correlation with fluid analysis. At least the basilar portion of the RIGHT hydropneumothorax has been present since December 2012 though full extent has not been previously imaged. Extensive peribronchial thickening with rounded area of opacity in the RIGHT lower lobe approximately 4.7 x 3.8 cm question rounded pneumonia versus neoplasm. Enlargement of cardiac chambers with small to moderate-sized pericardial effusion increased since previous exam. Aortic Atherosclerosis (ICD10-I70.0). Findings called to the patient's nurse Susana RN on [facility] on 07/26/2018 at 0035 hrs. Electronically Signed   By: Lavonia Dana M.D.   On: 07/26/2018 00:47   Dg Chest Port 1 View  Result Date: 08/03/2018 CLINICAL DATA:  Chest pain after chest tube removal. EXAM: PORTABLE CHEST 1 VIEW COMPARISON:  1 day prior FINDINGS: Midline trachea. Moderate cardiomegaly. Removal of small bore right-sided pleural catheter. The pleural air component of the right-sided hydropneumothorax is minimally enlarged. On the order of 5%. No left-sided effusion. Mild interstitial edema. Worsening right base airspace disease. IMPRESSION: Removal of right-sided chest tube with minimal increase in pleural air component of the right-sided hydropneumothorax.  Worsening right base airspace disease. Cardiomegaly and mild interstitial edema. Electronically Signed   By: Adria Devon.D.  On: 08/03/2018 07:34   Dg Chest Port 1 View  Result Date: 08/02/2018 CLINICAL DATA:  Hydropneumothorax. EXAM: PORTABLE CHEST 1 VIEW COMPARISON:  Radiographs 08/01/2018 and 07/30/2018. FINDINGS: 0831 hours. Small caliber right pleural pigtail catheter remains in place. Small right-sided hydropneumothorax is stable with small locules of air and a small right-sided pleural effusion inferiorly. The underlying aeration of the right lung is improved. The left lung is clear. The heart size and mediastinal contours are stable. IMPRESSION: Stable small right-sided hydropneumothorax following pleural catheter placement. Interval improved aeration of the right lung. Electronically Signed   By: Richardean Sale M.D.   On: 08/02/2018 16:02   Dg Chest Port 1 View  Result Date: 08/01/2018 CLINICAL DATA:  Hydropneumothorax. EXAM: PORTABLE CHEST 1 VIEW COMPARISON:  The chest x-ray 07/30/2018 FINDINGS: Heart is enlarged. Bilateral pleural effusions similar the prior study. Asymmetric edema is present on the right. Right pneumothorax has increased slightly. Locule of air is present at the base. A pigtail catheter is in place. Left upper lobe is clear. IMPRESSION: 1. Increased near component of right-sided hydropneumothorax. 2. Right base pigtail catheter stable. 3. Cardiomegaly with asymmetric right-sided edema. Electronically Signed   By: San Morelle M.D.   On: 08/01/2018 08:59   Dg Chest Port 1 View  Result Date: 07/29/2018 CLINICAL DATA:  Chest tube with chest pain EXAM: PORTABLE CHEST 1 VIEW COMPARISON:  07/25/2018 FINDINGS: Right base chest tube. There is small residual right hydropneumothorax. Cardiomegaly with pericardial effusion by CT. Small left pleural effusion. Vascular congestion. IMPRESSION: 1. Stable basal hydropneumothorax on the right. 2. Cardiomegaly and pericardial  effusion. Electronically Signed   By: Monte Fantasia M.D.   On: 07/29/2018 07:13   Ct Image Guided Drainage By Percutaneous Catheter  Result Date: 07/28/2018 INDICATION: 53 year old male with a history of prior right-sided decortication and recurrent hydropneumothorax. He has been referred for chest tube placement in attempt to collapse the potential space. EXAM: CT GUIDED RIGHT CHEST TUBE PLACEMENT MEDICATIONS: The patient is currently admitted to the hospital and receiving intravenous antibiotics. The antibiotics were administered within an appropriate time frame prior to the initiation of the procedure. ANESTHESIA/SEDATION: 1.5 mg IV Versed 75 mcg IV Fentanyl Moderate Sedation Time:  29 minutes The patient was continuously monitored during the procedure by the interventional radiology nurse under my direct supervision. COMPLICATIONS: None TECHNIQUE: Informed written consent was obtained from the patient after a thorough discussion of the procedural risks, benefits and alternatives. All questions were addressed. Maximal Sterile Barrier Technique was utilized including caps, mask, sterile gowns, sterile gloves, sterile drape, hand hygiene and skin antiseptic. A timeout was performed prior to the initiation of the procedure. PROCEDURE: Patient was positioned left decubitus. Scout CT of the chest performed. The lower right chest was prepped with chlorhexidine in a sterile fashion, and a sterile drape was applied covering the operative field. A sterile gown and sterile gloves were used for the procedure. Local anesthesia was provided with 1% Lidocaine. Using CT guidance, 18 gauge trocar needle was advanced into the complex fluid collection of the lower right chest. Modified Seldinger technique was used to place a 10 [place] drain into the pleural space. Catheter was attached to water seal aspiration chamber and sutured in position. Final images were stored and sent to PACs. Catheter was sutured in position. Patient  tolerated procedure well and remained hemodynamically stable throughout. No complications were encountered and no significant blood loss. FINDINGS: CT images of the chest demonstrate pleural thickening on the right with complex  fluid collection and multiple loculations. Ten [place] drain terminates within the costophrenic sulcus within the fluid collection. IMPRESSION: Status post CT-guided right thoracostomy tube placement. Signed, Dulcy Fanny. Dellia Nims, RPVI [facility] Radiology Electronically Signed   By: Corrie Mckusick D.O.   On: 07/28/2018 16:25     Results/Tests Pending at Time of Discharge:  None  Discharge Medications:  Allergies as of 08/04/2018      Reactions   Butalbital-apap-caffeine Shortness Of Breath, Swelling, Other (See Comments)   Swelling in throat   Ferrlecit [na Ferric Gluc Cplx In Sucrose] Shortness Of Breath, Swelling, Other (See Comments)   Swelling in throat, tolerates Venofor   Minoxidil Shortness Of Breath   Tylenol [acetaminophen] Anaphylaxis, Swelling   Darvocet [propoxyphene N-acetaminophen] Hives      Medication List    STOP taking these medications   ALPRAZolam 0.5 MG tablet Commonly known as:  XANAX   Oxycodone HCl 10 MG Tabs     TAKE these medications   amLODipine 5 MG tablet Commonly known as:  NORVASC Take 5 mg by mouth daily.   busPIRone 5 MG tablet Commonly known as:  BUSPAR Take 5 mg by mouth 2 (two) times daily.   cinacalcet 90 MG tablet Commonly known as:  SENSIPAR Take 90 mg by mouth every evening.   diclofenac sodium 1 % Gel Commonly known as:  VOLTAREN Apply 4 gram transdermaly four times daily for left shoulder pain   dicyclomine 10 MG/5ML syrup Commonly known as:  BENTYL Take 5 mLs by mouth 4 (four) times daily as needed for spasms.   diphenhydrAMINE 25 mg capsule Commonly known as:  BENADRYL Take 25 mg by mouth every 6 (six) hours as needed for itching.   ELIQUIS 5 MG Tabs  tablet Generic drug:  apixaban Take 5 mg by mouth 2 (two) times daily.   hydrALAZINE 100 MG tablet Commonly known as:  APRESOLINE Take 100 mg by mouth 3 (three) times daily.   insulin aspart 100 UNIT/ML injection Commonly known as:  novoLOG Inject into the skin 2 (two) times daily before a meal.   lanthanum 1000 MG chewable tablet Commonly known as:  FOSRENOL Chew 1,000 mg by mouth 3 (three) times daily with meals.   lipase/protease/amylase 12000 units Cpep capsule Commonly known as:  CREON Take 1 capsule (12,000 Units total) by mouth 3 (three) times daily before meals.   multivitamin Tabs tablet Take 1 tablet by mouth daily.   NEPHRO VITAMINS 0.8 MG Tabs Take 1 tablet by mouth daily.   nitroGLYCERIN 0.4 MG SL tablet Commonly known as:  NITROSTAT Place 0.4 mg under the tongue every 5 (five) minutes as needed for chest pain.   NUTRITIONAL SUPPLEMENT PO Liberalized Renal Diet - Regular Texture   ondansetron 4 MG disintegrating tablet Commonly known as:  ZOFRAN-ODT Take 4 mg by mouth every 8 (eight) hours as needed for nausea/vomiting.   OXYGEN O2 via nasal cannula at 2L/min for O2 sats 90% or below as needed for shortness of breath   pantoprazole 40 MG tablet Commonly known as:  PROTONIX Take 1 tablet (40 mg total) by mouth daily.   polyethylene glycol packet Commonly known as:  MIRALAX / GLYCOLAX Take 17 g by mouth daily. What changed:    when to take this  reasons to take this   senna-docusate 8.6-50 MG tablet Commonly known as:  Senokot-S Take 2 tablets by mouth at bedtime. What changed:    when to take this  reasons to take this   zolpidem 5 MG tablet Commonly known as:  AMBIEN Take 1 tablet (5 mg total) by mouth at bedtime.       Discharge Instructions: Please refer to Patient Instructions section of EMR for full details.  Patient was counseled important signs and symptoms that should prompt return to medical care, changes in medications,  dietary instructions, activity restrictions, and follow up appointments.   Follow-Up Appointments: Follow-up Information    Ivin Poot, MD Follow up.   Specialty:  Cardiothoracic Surgery Why:  Your follow-up appointment is on 08/19/2018 at 10:30. Please arrive at 10:00am for a chest xray located at [facility] which is on the first floor of our building.  Contact information: [place] [address] [phone]        Lorretta Harp, MD. Call in 1 day(s).   Specialties:  Cardiology, Radiology Contact information: [address] [place] [address] [place], [place], DO 08/04/2018, 3:12 PM PGY-1, [place]

## 2018-08-02 NOTE — Progress Notes (Addendum)
[name]: [phone]  Patient name: Frank [name] Medical record number: [id number] Date of birth: [birth year] Age: 53 y.o. Gender: male  Primary Care Provider: Patient, [name] Consultants: Nephrology  Code Status: Full   Pt Overview and Major Events to Date:  Admitted to [facility] on 07/25/18 1/4: CXR: oculated right sided pleural effusion improved from last xray with possible extrapleural air component at right lung base seen on previous CT 12/2 1/5: CT: pleural effusion and pleural thickening in the R hemithorax with large loculated anterolateral hydropneumothroax in R chest, favoring recurrent empyema and surgery 1/7: Right pleural pigtail catheter placed by IR 1/8: CXR small residual right hydropneumothorax 1/10: PT/OT rec SNF, pt agreeable  Assessment and Plan: Frank [name] is a 53 y.o. male presenting with SOB, R-sided chest pain, nausea/vomiting, diarrhea. PMH is significant for right sided empyema with thoracotomy 06/2018, VTE on Eliquis, chronic pancreatitis.   Acute hypoxia without respiratory distress  Recurrent pleural effusions  Hydropneumothorax - Improving Pleural pigtail catheter placed by IR on 1/7.  Pleural cultures without growth.  CVTS following for recurrent pleural effusion, Water seal ON, repeat CXR this AM. Currently on RA with no increased work of breathing and stable vitals.  - CVTS following, appreciate recs, follow up repeat CXR, possible removal of pigtail today if unchanged on CXR, pt to follow-up outpatient - IR following, appreciate recommendations - PT/OT consulted, recommend SNF, CSW consulted for placement - Continue Eliquis  - Continue PO Oxy 67m q4 hours PRN    - Lidocaine PRN for pain, topical voltaren gel PRN - Zofran PRN  ESRD (mwf): Nephrology following. Last HD 1/11. Next HD on 1/13. Appears euvolemic on exam with clear breath sounds. EDW 74kg. Wt this AM: 70.9kg. 2.9L out at HD. Na unchanged  at 132, K stable at 3.9, Phos 2.8 - Nephrology following, appreciate recommendations - renal diet  H/o Chronic pancreatitis with acute N/V/Diarrea: resolved Denies any current symptoms. Tolerating PO this morning. Denies constipation.  Home meds: creon 12000 units TID, multivitamin daily, nutritional supplements.  - Continue to encourage oral hydration - I/O monitoring - monitor for constipation as patient is on opioids - Continue bentyl 148mQID PRN - continue creon TID before meals - continue multivitamin - nutrition consulted - recommend Nepro Shake BID  Anemia of chronic disease: stable stable. hgb range from 7-8. Transfusion threshold <7.0. - continue to monitor - nephrology following, appreciate recommendations  - Will follow-up Hgb if any signs of bleeding  Chronic pain requiring opioids Secondary to thoracotomy and chronic pancreatitis. [place] controlled substance database reviewed. Of note, patient has been seen by 8 different physicians within the last 6 months for opioids. Will limit opioid pain meds, continue home pain meds. - Continue PO Oxy 1010m4 hrs PRN - Of note, [facility] would like to be sole prescriber on opioids for patient during this admission  Depression/anxiety Home meds: alprazolam .5mg16mD PRN, buspar 5mg 3m, zolpidem 5mg Q25m- continue buspar - continue zolpidem - hold alprazolam (has not been filled since July 2019)  Chronic combined CHF home meds: nitrostat PRN - continue nitrostat PRN  HTN: stable Home meds: amlodipine 5mg da40m, hydralazine 100mg TI85mP this AM 133/79. Currently on home meds. - continue home amlodipine and hydralazine - monitor BP per floor protocol   Ichthyosis (unknown type)- diffuse cracked skin - moisturizers PRN  FEN/GI: renal diet + Nepro Shake BID, fluid restriction, Protonix, Miralax, Senokot Prophylaxis: Eliquis  Disposition: Pending removal of pigtail and CVTS recs, anticipate discharge to SNF, patient in  agreement.  Subjective:  No concerns or complaints the morning. Denies any nausea, vomiting, diarrhea or constipation. Feels comfortable on room air. Is open to SNF placement to help gain strength. Looking forward to getting chest tube removed.    Objective: Temp:  [97.9 F (36.6 C)-99.3 F (37.4 C)] 98.4 F (36.9 C) (01/12 0604) Pulse Rate:  [74-99] 86 (01/12 0604) Resp:  [16-19] 19 (01/12 0604) BP: (131-161)/(75-96) 142/96 (01/12 0604) SpO2:  [94 %-97 %] 97 % (01/12 0604) Weight:  [70.9 kg-73.8 kg] 70.9 kg (01/11 1742)  Physical Exam: General: comfortably lying in bed, well nourished, well developed, NAD with non-toxic appearance HEENT: normocephalic, atraumatic, moist mucous membranes Cardiovascular: regular rate and rhythm without murmurs, rubs, or gallops, <2 sec cap refill, 2+ radial and pedal pulses bilaterally Lungs: clear to auscultation bilaterally with normal work of breathing Abdomen: soft, non-tender, non-distended, normoactive bowel sounds Skin: warm, diffusely dry, dressings intact on right chest wall without drainage, pigtail intact without erythema or drainage Extremities: warm and well perfused, normal tone, no edema MSK: right chest wall tender to palpation   Laboratory: Recent Labs  Lab 07/31/18 0633 08/01/18 1402 08/02/18 0938  WBC 6.0 6.0 6.1  HGB 8.6* 8.8* 8.7*  HCT 27.9* 28.7* 29.2*  PLT 173 182 162   Recent Labs  Lab 07/30/18 1204 08/01/18 1402 08/02/18 0938  NA 134* 132* 132*  K 4.5 4.2 3.9  CL 97* 94* 94*  CO2 _0 BUN 26* 19 12  CREATININE 10.49* 8.96* 6.46*  CALCIUM 7.8* 7.2* 7.5*  GLUCOSE 117* 85 112*     Imaging/Diagnostic Tests: Dg Chest 1 View  Result Date: 07/30/2018 CLINICAL DATA:  Hydropneumothorax. EXAM: CHEST  1 VIEW COMPARISON:  Radiograph of July 29, 2018. FINDINGS: Stable cardiomegaly. No pneumothorax is noted. Stable left basilar opacity is noted concerning for atelectasis or infiltrate with associated pleural  effusion. Stable position of right-sided pigtail drainage catheter in right lung base. Stable right basilar hydropneumothorax is noted. Bony thorax is unremarkable. IMPRESSION: Stable cardiomegaly. Stable position of right-sided pigtail drainage catheter in right lung base with stable right basilar hydropneumothorax. Stable left basilar opacity as described above. Electronically Signed   By: Marijo Conception, M.D.   On: 07/30/2018 13:56   Dg Chest 2 View  Result Date: 07/25/2018 CLINICAL DATA:  Chest pain. EXAM: CHEST - 2 VIEW COMPARISON:  Most recent chest radiograph 06/22/2018. Lung bases from abdominal CT 06/22/2018 FINDINGS: Decreased right pleural effusion from prior radiograph, partially loculated small volume of pleural fluid remains. Possible partially loculated extrapleural air at the right lung base, not well assessed radiographically. Patchy opacities throughout the right hemithorax. Cardiomegaly is unchanged. Mild interstitial edema suspected. IMPRESSION: 1. Decreased right pleural effusion from prior radiograph, residual partially loculated pleural effusion persists. Possible extrapleural air component at the right lung base, as seen from included lung bases on abdominal CT 06/22/2018. 2. Chronic cardiomegaly with mild interstitial edema. Patchy opacities throughout the right hemithorax may represent asymmetric edema or superimposed infection. Electronically Signed   By: Keith Rake M.D.   On: 07/25/2018 03:56   Ct Chest Wo Contrast  Result Date: 07/26/2018 CLINICAL DATA:  Shortness of breath; history CHF, end-stage renal disease on dialysis, non ischemic cardiomyopathy, hypertension, underwent thoracotomy for empyema in December 2019. EXAM: CT CHEST WITHOUT CONTRAST TECHNIQUE: Multidetector CT imaging of the chest was performed following the standard protocol without IV contrast.  IV contrast was not administered since no adequate IV access could be established. Sagittal and coronal MPR images  reconstructed from axial data set. COMPARISON:  01/31/2018 CT chest Correlation: CT abdomen and pelvis 06/22/2018 FINDINGS: Cardiovascular: Scattered atherosclerotic calcifications of aorta, coronary arteries and proximal great vessels. Enlargement of cardiac chambers. Small to moderate sized pericardial effusion slightly increased from previous exam. Upper normal caliber ascending thoracic aorta. Mediastinum/Nodes: Mildly enlarged precarinal lymph node 12 mm short axis image 64. Additional AP window node 14 mm short axis image 64. Normal sized RIGHT paratracheal and prevascular nodes. Base of cervical region normal appearance. Esophagus unremarkable. Lungs/Pleura: Central peribronchial thickening. Consolidation versus mass RIGHT lower lobe 4.7 x 3.8 cm image 111. Scattered pleural effusion and pleural thickening throughout lateral and posterior RIGHT hemithorax with loculated hydropneumothorax at the anterolateral RIGHT chest, likely representing residual or recurrent empyema in a patient with recent empyema. Basilar portion of RIGHT has been present since the prior CT exam from December 2019, though the cranial extent of this was not previously imaged. Compressive atelectasis in RIGHT upper and RIGHT middle lobes. Upper Abdomen: Atrophic upper kidneys. Probable cyst upper pole LEFT kidney 20 x 20 mm. Remaining visualized upper abdomen unremarkable. Musculoskeletal: Diffuse osseous sclerosis likely renal osteodystrophy in patient with history of renal failure. IMPRESSION: Pleural effusion and pleural thickening in the RIGHT hemithorax with a large loculated anterolateral hydropneumothorax in the RIGHT chest, favor recurrent empyema in patient with recent empyema and surgery; recommend correlation with fluid analysis. At least the basilar portion of the RIGHT hydropneumothorax has been present since December 2012 though full extent has not been previously imaged. Extensive peribronchial thickening with rounded area  of opacity in the RIGHT lower lobe approximately 4.7 x 3.8 cm question rounded pneumonia versus neoplasm. Enlargement of cardiac chambers with small to moderate-sized pericardial effusion increased since previous exam. Aortic Atherosclerosis (ICD10-I70.0). Findings called to the patient's nurse Susana RN on [facility] on 07/26/2018 at 0035 hrs. Electronically Signed   By: Lavonia Dana M.D.   On: 07/26/2018 00:47   Dg Chest Port 1 View  Result Date: 08/01/2018 CLINICAL DATA:  Hydropneumothorax. EXAM: PORTABLE CHEST 1 VIEW COMPARISON:  The chest x-ray 07/30/2018 FINDINGS: Heart is enlarged. Bilateral pleural effusions similar the prior study. Asymmetric edema is present on the right. Right pneumothorax has increased slightly. Locule of air is present at the base. A pigtail catheter is in place. Left upper lobe is clear. IMPRESSION: 1. Increased near component of right-sided hydropneumothorax. 2. Right base pigtail catheter stable. 3. Cardiomegaly with asymmetric right-sided edema. Electronically Signed   By: San Morelle M.D.   On: 08/01/2018 08:59   Dg Chest Port 1 View  Result Date: 07/29/2018 CLINICAL DATA:  Chest tube with chest pain EXAM: PORTABLE CHEST 1 VIEW COMPARISON:  07/25/2018 FINDINGS: Right base chest tube. There is small residual right hydropneumothorax. Cardiomegaly with pericardial effusion by CT. Small left pleural effusion. Vascular congestion. IMPRESSION: 1. Stable basal hydropneumothorax on the right. 2. Cardiomegaly and pericardial effusion. Electronically Signed   By: Monte Fantasia M.D.   On: 07/29/2018 07:13   Ct Image Guided Drainage By Percutaneous Catheter  Result Date: 07/28/2018 INDICATION: 53 year old male with a history of prior right-sided decortication and recurrent hydropneumothorax. He has been referred for chest tube placement in attempt to collapse the potential space. EXAM: CT GUIDED RIGHT CHEST TUBE PLACEMENT MEDICATIONS: The patient is currently admitted to the  hospital and receiving intravenous antibiotics. The antibiotics were administered within an appropriate time frame  prior to the initiation of the procedure. ANESTHESIA/SEDATION: 1.5 mg IV Versed 75 mcg IV Fentanyl Moderate Sedation Time:  29 minutes The patient was continuously monitored during the procedure by the interventional radiology nurse under my direct supervision. COMPLICATIONS: None TECHNIQUE: Informed written consent was obtained from the patient after a thorough discussion of the procedural risks, benefits and alternatives. All questions were addressed. Maximal Sterile Barrier Technique was utilized including caps, mask, sterile gowns, sterile gloves, sterile drape, hand hygiene and skin antiseptic. A timeout was performed prior to the initiation of the procedure. PROCEDURE: Patient was positioned left decubitus. Scout CT of the chest performed. The lower right chest was prepped with chlorhexidine in a sterile fashion, and a sterile drape was applied covering the operative field. A sterile gown and sterile gloves were used for the procedure. Local anesthesia was provided with 1% Lidocaine. Using CT guidance, 18 gauge trocar needle was advanced into the complex fluid collection of the lower right chest. Modified Seldinger technique was used to place a 10 [place] drain into the pleural space. Catheter was attached to water seal aspiration chamber and sutured in position. Final images were stored and sent to PACs. Catheter was sutured in position. Patient tolerated procedure well and remained hemodynamically stable throughout. No complications were encountered and no significant blood loss. FINDINGS: CT images of the chest demonstrate pleural thickening on the right with complex fluid collection and multiple loculations. Ten [place] drain terminates within the costophrenic sulcus within the fluid collection. IMPRESSION: Status post CT-guided right thoracostomy tube placement. Signed, Dulcy Fanny. Dellia Nims,  RPVI [facility] Radiology Electronically Signed   By: Corrie Mckusick D.O.   On: 07/28/2018 16:25    Danna Hefty, DO 08/02/2018, 10:30 AM PGY-1, Gross Intern pager: [phone], text pages welcome

## 2018-08-02 NOTE — Progress Notes (Addendum)
[facility] Progress Note   Subjective:   Seen and examined at bedside.  Happy to be get the chest tube out today.  States HD yesterday went well. No new complaints.  Pain mostly well controlled.    Objective Vitals:   08/01/18 1700 08/01/18 1742 08/01/18 2145 08/02/18 0604  BP: (!) 142/86 (!) 153/82 131/75 (!) 142/96  Pulse: 81 78 89 86  Resp:  _0 Temp:  98 F (36.7 C) 99.3 F (37.4 C) 98.4 F (36.9 C)  TempSrc:  Oral  Oral  SpO2:  96% 94% 97%  Weight:  70.9 kg    Height:       Physical Exam General:NAD, chronically ill appearing male, laying in bed Heart:RRR, no mrg Lungs:mostly CTAB, BS decreased R>L, nml WOB Abdomen:soft, NTND Extremities:trace LE edema Dialysis Access: LU AVF +b/t   Filed Weights   07/31/18 0536 08/01/18 1340 08/01/18 1742  Weight: 72.8 kg 73.8 kg 70.9 kg    Intake/Output Summary (Last 24 hours) at 08/02/2018 1247 Last data filed at 08/01/2018 1900 Gross per 24 hour  Intake 340 ml  Output 2900 ml  Net -2560 ml    Additional Objective Labs: Basic Metabolic Panel: Recent Labs  Lab 07/30/18 1204 08/01/18 1402 08/02/18 0938  NA 134* 132* 132*  K 4.5 4.2 3.9  CL 97* 94* 94*  CO2 _1 GLUCOSE 117* 85 112*  BUN 26* 19 12  CREATININE 10.49* 8.96* 6.46*  CALCIUM 7.8* 7.2* 7.5*  PHOS 4.3 3.2 2.8   Liver Function Tests: Recent Labs  Lab 07/30/18 1204 08/01/18 1402 08/02/18 0938  ALBUMIN 2.1* 2.2* 2.2*   CBC: Recent Labs  Lab 07/29/18 0404 07/30/18 1204 07/31/18 0633 08/01/18 1402 08/02/18 0938  WBC 4.3 1.4* 6.0 6.0 6.1  NEUTROABS  --   --  3.3  --   --   HGB 7.9* 8.4* 8.6* 8.8* 8.7*  HCT 26.3* 27.5* 27.9* 28.7* 29.2*  MCV 80.4 81.8 81.8 81.5 80.4  PLT 199 187 173 182 162   Recent Labs  Lab 07/28/18 0908 07/28/18 1018 07/29/18 2350  GLUCAP 75 106* 85   Studies/Results: Dg Chest Port 1 View  Result Date: 08/01/2018 CLINICAL DATA:  Hydropneumothorax. EXAM: PORTABLE CHEST 1 VIEW COMPARISON:   The chest x-ray 07/30/2018 FINDINGS: Heart is enlarged. Bilateral pleural effusions similar the prior study. Asymmetric edema is present on the right. Right pneumothorax has increased slightly. Locule of air is present at the base. A pigtail catheter is in place. Left upper lobe is clear. IMPRESSION: 1. Increased near component of right-sided hydropneumothorax. 2. Right base pigtail catheter stable. 3. Cardiomegaly with asymmetric right-sided edema. Electronically Signed   By: San Morelle M.D.   On: 08/01/2018 08:59    Medications: . sodium chloride    . sodium chloride     . amLODipine  5 mg Oral Daily  . apixaban  5 mg Oral Q12H  . busPIRone  5 mg Oral BID  . Chlorhexidine Gluconate Cloth  6 each Topical Q0600  . cinacalcet  90 mg Oral QPM  . feeding supplement (NEPRO CARB STEADY)  237 mL Oral BID BM  . hydrALAZINE  100 mg Oral TID  . lanthanum  1,000 mg Oral TID WC  . lipase/protease/amylase  12,000 Units Oral TID AC  . multivitamin  1 tablet Oral QHS  . pantoprazole  40 mg Oral Daily  . sodium chloride flush  5 mL Intracatheter Q8H  . zolpidem  5  mg Oral QHS    Dialysis Orders: [facility] MWF  4h 400/800 74kg 2/2.25 bath Hep none LUA AVF No heparin  -Hectorol 48mg IV TIW -Venofer 107mIV x 10 (started 1/3)  -Mircera 225 mcg IV 2 weeks (last 1/3)  Assessment/Plan: 1.CP/Dyspnea - Rrecurrentpleural effusion:s/p thoracotomy/ drainage and decortication12/2019at [facility]. Returns w/ R chest pain and residual PTX, s/pR pigtail drain placed 07/28/18 by IR and removed today by CTS.Cultures NGTD.CXR1/8showed small R residual hydropneumothorax.Repeat CXR tomorrow.Per primary/IR. 2. ESRD -on HD MWF. last K 3.9. Resume regular HD schedule tomorrow, orders written. 3. Anemia of CKD-Hgb^ 8.7. ESA dosed as OP on 07/24/18.  Aranesp 20045mqwk on Friday. Follow trends.Will transfuse if Hgb<7. 4. Secondary hyperparathyroidism -Ca and phos in goal.Continue  VDRA/binders/Sensipar. Follow trends. 5. HTN/volume -BPelevated.Continue home meds. No ^vol and under EDW, will likely need new EDW at d/c.  Post HD weight on 1/11 ~71kg. 6. Nutrition -Renal diet w/fluid restrictions, renal vitamins, protein supplements. Alb 2.2 7. Chronic pancreatitis/nausea/vomiting - per primary  LinJen MowA-C [facility] Pager: [phone]/2020,12:47 PM  LOS: 7 days   Pt seen, examined and agree w A/P as above.  RobKelly Splinter Car[employer]ell Rubbermaid[employer] [phone]/06/2019, 1:29 PM

## 2018-08-03 ENCOUNTER — Inpatient Hospital Stay (HOSPITAL_COMMUNITY): Payer: Medicare Other

## 2018-08-03 LAB — CULTURE, BODY FLUID W GRAM STAIN -BOTTLE: Culture: NO GROWTH

## 2018-08-03 LAB — BASIC METABOLIC PANEL
Anion gap: 9 (ref 5–15)
BUN: 20 mg/dL (ref 6–20)
CO2: 28 mmol/L (ref 22–32)
Calcium: 7.6 mg/dL — ABNORMAL LOW (ref 8.9–10.3)
Chloride: 97 mmol/L — ABNORMAL LOW (ref 98–111)
Creatinine, Ser: 8.06 mg/dL — ABNORMAL HIGH (ref 0.61–1.24)
GFR calc Af Amer: 8 mL/min — ABNORMAL LOW (ref >60)
GFR calc non Af Amer: 7 mL/min — ABNORMAL LOW (ref >60)
Glucose, Bld: 100 mg/dL — ABNORMAL HIGH (ref 70–99)
Potassium: 4.1 mmol/L (ref 3.5–5.1)
SODIUM: 134 mmol/L — AB (ref 135–145)

## 2018-08-03 NOTE — Progress Notes (Addendum)
[address]       [place] [address]             [phone]           Subjective: Patient resting in bed. He states his breathing is fine.  Objective: Vital signs in last 24 hours: Temp:  [98.1 F (36.7 C)-98.5 F (36.9 C)] 98.1 F (36.7 C) (01/12 2200) Pulse Rate:  [77-84] 77 (01/12 2200) BP: (137-151)/(82-87) 137/82 (01/12 2200) SpO2:  [94 %] 94 % (01/12 1349)     Intake/Output from previous day: No intake/output data recorded.   Physical Exam:  Cardiovascular: RRR Pulmonary: Clear to auscultation on the left and slightly diminished right base  Extremities: No lower extremity edema. Wounds: Dressing is clean and dry.    Lab Results: CBC: Recent Labs    08/01/18 1402 08/02/18 0938  WBC 6.0 6.1  HGB 8.8* 8.7*  HCT 28.7* 29.2*  PLT 182 162   BMET:  Recent Labs    08/01/18 1402 08/02/18 0938  NA 132* 132*  K 4.2 3.9  CL 94* 94*  CO2 25 27  GLUCOSE 85 112*  BUN 19 12  CREATININE 8.96* 6.46*  CALCIUM 7.2* 7.5*    PT/INR: No results for input(s): LABPROT, INR in the last 72 hours. ABG:  INR: Will add last result for INR, ABG once components are confirmed Will add last 4 CBG results once components are confirmed  Assessment/Plan:  1. CV - SR in the 70's 2.  Pulmonary - Cultures no growth to date. Pigtail removed yesterday. CXR this am shows ? slight increase in right pneumothorax. Will recheck CXR in am. 3. ESRD-HD M/W/Fri. Nephrology following  Donielle M ZimmermanPA-C 08/03/2018,7:39 AM [phone]  Most current chest x-ray reviewed, agree with above assessment The remaining space lateral to the lung and the right pleural cavity is a result of the right lung being entrapped from a pleural peel which was not able to be removed at the time of surgery.  The space will be permanent.  However there is no evidence of infection of the space [from material cultured through the pigtail catheter] and no  further therapy is  needed.

## 2018-08-03 NOTE — Progress Notes (Signed)
Pt.  Complaints of right side back pain. This nurse offered oxycodone as ordered pt. Refused states medication doesn't work. Pt. States " I have been taking IV pain meds with each treatment." Dr. Carolin Sicks paged and notified orders to notify primary for IV pain meds and to offer ordered oxycodone. This nurse offered ordered oxycodone again and pt. Refused states if he cant have IV pain meds stop his treatment. Primary team paged and notified [place], DO. No new orders states offer ordered pain meds. Offered to patient a 3rd time pt. Refused states take me off. Veneta Penton PA made aware of events saw pt. At bedside pt. Continue to sign off treatment

## 2018-08-03 NOTE — Progress Notes (Signed)
[facility] Progress Note   Subjective:  Came up to HD unit, ran x 30 minutes only, then insisted on signing off after we refused to give him IV dilaudid. Was complaining of back and abdominal pain, but looked 100% completely comfortable. Offered to give PO oxycodone per family medicine plan, but he refused. He kept trying to negotiate ("give me the meds and I'll do the dialysis"), told that it doesn't work like that. Pt rinsed back and taken back to his room per his request - will plan on HD again tomorrow (as make-up for today), but made it clear that no IV narcotics will be given with dialysis.  Objective Vitals:   08/01/18 2145 08/02/18 0604 08/02/18 1349 08/02/18 2200  BP: 131/75 (!) 142/96 (!) 151/87 137/82  Pulse: 89 86 84 77  Resp: 18 19    Temp: 99.3 F (37.4 C) 98.4 F (36.9 C) 98.5 F (36.9 C) 98.1 F (36.7 C)  TempSrc:  Oral Oral Oral  SpO2: 94% 97% 94%   Weight:      Height:       Physical Exam General: Well appearing man, NAD at this time. Heart: RRR; no murmur Lungs: CTAB Abdomen: soft, non-tender Extremities: No LE edema Dialysis Access:  L AVF + thrill  Additional Objective Labs: Basic Metabolic Panel: Recent Labs  Lab 07/30/18 1204 08/01/18 1402 08/02/18 0938  NA 134* 132* 132*  K 4.5 4.2 3.9  CL 97* 94* 94*  CO2 _0 GLUCOSE 117* 85 112*  BUN 26* 19 12  CREATININE 10.49* 8.96* 6.46*  CALCIUM 7.8* 7.2* 7.5*  PHOS 4.3 3.2 2.8   Liver Function Tests: Recent Labs  Lab 07/30/18 1204 08/01/18 1402 08/02/18 0938  ALBUMIN 2.1* 2.2* 2.2*   CBC: Recent Labs  Lab 07/29/18 0404 07/30/18 1204 07/31/18 0633 08/01/18 1402 08/02/18 0938  WBC 4.3 1.4* 6.0 6.0 6.1  NEUTROABS  --   --  3.3  --   --   HGB 7.9* 8.4* 8.6* 8.8* 8.7*  HCT 26.3* 27.5* 27.9* 28.7* 29.2*  MCV 80.4 81.8 81.8 81.5 80.4  PLT 199 187 173 182 162   Blood Culture    Component Value Date/Time   SDES PLEURAL 07/29/2018 1300   SDES PLEURAL 07/29/2018 1300    SPECREQUEST NONE 07/29/2018 1300   SPECREQUEST NONE 07/29/2018 1300   CULT  07/29/2018 1300    NO GROWTH 5 DAYS Performed at [facility], [place] [address] [place], [place] [address]    REPTSTATUS 08/03/2018 FINAL 07/29/2018 1300   REPTSTATUS 07/29/2018 FINAL 07/29/2018 1300   Studies/Results: Dg Chest Port 1 View  Result Date: 08/03/2018 CLINICAL DATA:  Chest pain after chest tube removal. EXAM: PORTABLE CHEST 1 VIEW COMPARISON:  1 day prior FINDINGS: Midline trachea. Moderate cardiomegaly. Removal of small bore right-sided pleural catheter. The pleural air component of the right-sided hydropneumothorax is minimally enlarged. On the order of 5%. No left-sided effusion. Mild interstitial edema. Worsening right base airspace disease. IMPRESSION: Removal of right-sided chest tube with minimal increase in pleural air component of the right-sided hydropneumothorax. Worsening right base airspace disease. Cardiomegaly and mild interstitial edema. Electronically Signed   By: Abigail Miyamoto M.D.   On: 08/03/2018 07:34   Dg Chest Port 1 View  Result Date: 08/02/2018 CLINICAL DATA:  Hydropneumothorax. EXAM: PORTABLE CHEST 1 VIEW COMPARISON:  Radiographs 08/01/2018 and 07/30/2018. FINDINGS: 0831 hours. Small caliber right pleural pigtail catheter remains in place. Small right-sided hydropneumothorax is stable with  small locules of air and a small right-sided pleural effusion inferiorly. The underlying aeration of the right lung is improved. The left lung is clear. The heart size and mediastinal contours are stable. IMPRESSION: Stable small right-sided hydropneumothorax following pleural catheter placement. Interval improved aeration of the right lung. Electronically Signed   By: Richardean Sale M.D.   On: 08/02/2018 16:02   Medications:  . amLODipine  5 mg Oral Daily  . apixaban  5 mg Oral Q12H  . busPIRone  5 mg Oral BID  . Chlorhexidine Gluconate Cloth  6 each Topical Q0600  . cinacalcet  90 mg  Oral QPM  . [START ON 08/07/2018] darbepoetin (ARANESP) injection - DIALYSIS  200 mcg Intravenous Q Fri-HD  . feeding supplement (NEPRO CARB STEADY)  237 mL Oral BID BM  . hydrALAZINE  100 mg Oral TID  . lanthanum  1,000 mg Oral TID WC  . lipase/protease/amylase  12,000 Units Oral TID AC  . multivitamin  1 tablet Oral QHS  . pantoprazole  40 mg Oral Daily  . sodium chloride flush  5 mL Intracatheter Q8H  . zolpidem  5 mg Oral QHS    Dialysis Orders: [facility] MWF  4h 400/800 74kg 2/2.25 bath Hep none LUA AVF No heparin  -Hectorol 55mg IV TIW -Venofer 1085mIV x 10 (started 1/3)  -Mircera 225 mcg IV 2 weeks (last 1/3)  Assessment/Plan: 1.CP/Dyspnea - Rrecurrentpleural effusion:s/p thoracotomy/ drainage and decortication12/2019at [facility]. Returned w/ R chest pain and residual PTX, s/pR pigtail drain placed 07/28/18 by IR, removed 1/12 by CTS.Cultures NGTD.CXR1/8showed small R residual hydropneumothorax.Per primary/IR. 2. ESRD -on HD MWF. Minimal HD today, will put on sched for Tuesday. 3. Anemia of CKD-Hgb 8.7. Continue Aranesp 20054mqwk on Friday.  4. Secondary hyperparathyroidism -Ca and phos in goal.Continue VDRA/binders/Sensipar. Follow trends. 5. HTN/volume -BPelevated.Continue home meds. No ^vol and under EDW, will likely need new EDW at d/c.  Post HD weight on 1/11 ~71kg. 6. Nutrition -Renal diet w/fluid restrictions, renal vitamins, protein supplements. Alb 2.2 7. Chronic pancreatitis/nausea/vomiting - per primary 8. Opioid dependence: Following plan per family medicine team - encouraging PO meds only.  KatVeneta PentonA-C 08/03/2018, 10:59 AM  [facility] Pager: [phone]

## 2018-08-03 NOTE — Progress Notes (Signed)
Pt. Complains of right back pain. Nurse offered p.o oxycodone as ordered. Pt. Refused states "it doesn't work, I have been taking IV pain meds with my treatment". Dr. Carolin Sicks paged and notified states to offer p.o oxycodone as ordered and to notify attending MD. Oxycodone offered pt. Refused. Family medicine Mina Marble, DO paged and notified, no new orders states offer current order for oxycodone. Oxycodone offered a 3rd time as ordered pt. Refused and request to discontinue HD treatment. Veneta Penton PA nephrology made aware saw pt. At bedside.

## 2018-08-03 NOTE — Clinical Social Work Note (Signed)
Clinical Social Work Assessment  Patient Details  Name: Frank [name] MRN: [id number] Date of Birth: Aug 16, 1965  Date of referral:  08/03/18               Reason for consult:  Facility Placement                Permission sought to share information with:  Facility Art therapist granted to share information::  Yes, Verbal Permission Granted  Name::        Agency::  SNFs  Relationship::     Contact Information:     Housing/Transportation Living arrangements for the past 2 months:  Single Family Home Source of Information:  Patient Patient Interpreter Needed:  None Criminal Activity/Legal Involvement Pertinent to Current Situation/Hospitalization:  No - Comment as needed Significant Relationships:  Other(Comment)(roommate) Lives with:  Roommate Do you feel safe going back to the place where you live?  Yes Need for family participation in patient care:  No (Coment)  Care giving concerns:  CSW received consult for possible SNF placement at time of discharge. CSW spoke with patient regarding PT recommendation of SNF placement at time of discharge. Patient reported that patient's roommate is currently unable to care for patient at their home given patient's current physical needs. Patient expressed understanding of PT recommendation and is agreeable to SNF placement at time of discharge. CSW to continue to follow and assist with discharge planning needs.   Social Worker assessment / plan:  CSW spoke with patient concerning possibility of rehab at [facility] before returning home.  Employment status:  Disabled (Comment on whether or not currently receiving Disability) Insurance information:  Medicare PT Recommendations:  [place] / Referral to community resources:  [place]  Patient/Family's Response to care:  Patient recognizes need for rehab before returning home and is agreeable to a SNF in [place]. Patient reported  preference for [facility] out of the facility options that can transport to dialysis, but he only wants a private room. He stated that he only needs a few days of rehab. CSW explained that patient has 10 Medicare days left and then copays willl begin.  Patient/Family's Understanding of and Emotional Response to Diagnosis, Current Treatment, and Prognosis: Patient/family is realistic regarding therapy needs and expressed being hopeful for SNF placement. Patient expressed understanding of CSW role and discharge process as well as medical condition. No questions/concerns about plan or treatment.    Emotional Assessment Appearance:  Appears stated age Attitude/Demeanor/Rapport:  Engaged Affect (typically observed):  Accepting, Appropriate Orientation:  Oriented to Self, Oriented to Place, Oriented to  Time, Oriented to Situation Alcohol / Substance use:  Not Applicable Psych involvement (Current and /or in the community):  No (Comment)  Discharge Needs  Concerns to be addressed:  Care Coordination Readmission within the last 30 days:  No Current discharge risk:  None Barriers to Discharge:  Continued Medical Work up   [employer], LCSW 08/03/2018, 12:41 PM

## 2018-08-03 NOTE — NC FL2 (Signed)
[place] LEVEL OF CARE SCREENING TOOL     IDENTIFICATION  Patient Name: Frank [name] Birthdate: 1966/07/09 Sex: male Admission Date (Current Location): 07/25/2018  [facility] and [place] Number:  Herbalist and Address:  [facility], [place] [address], [place], [place] [address]      Provider Number: [id number]  Attending Physician Name and Address:  [name], MD  Relative Name and Phone Number:  Seth Bake sister, [phone]    Current Level of Care: Hospital Recommended Level of Care: [place] Prior Approval Number:    Date Approved/Denied:   PASRR Number: [id number]  Discharge Plan: SNF    Current Diagnoses: Patient Active Problem List   Diagnosis Date Noted  . Malnutrition of moderate degree 07/29/2018  . Chest tube in place   . Chronic, continuous use of opioids 07/28/2018  . Chest pain   . Chronic vomiting 07/26/2018  . History of Clostridioides difficile infection 07/26/2018  . Empyema of right pleural space ([place]) 07/26/2018  . Chronic pancreatitis ([place]) 05/09/2018  . Dialysis patient, noncompliant ([place]) 03/05/2018  . DNR (do not resuscitate) discussion   . Hydropneumothorax 01/31/2018  . Benign hypertensive heart and kidney disease with systolic CHF, NYHA class 3 and CKD stage 5 ([place])   . End-stage renal disease on hemodialysis ([place])   . Cirrhosis ([place])   . Pancreatic pseudocyst   . End stage renal disease on dialysis ([place]) 05/26/2017  . Marijuana abuse 04/21/2017  . History of DVT (deep vein thrombosis) 03/11/2017  . Aortic atherosclerosis ([place]) 01/05/2017  . GERD (gastroesophageal reflux disease) 05/29/2016  . Nonischemic cardiomyopathy ([place]) 01/09/2016  . Chronic abdominal pain   . Recurrent abdominal pain   . Left renal mass 10/30/2015  . Chronic systolic heart failure ([place]) 09/23/2015  . Recurrent chest pain 09/08/2015  . Essential hypertension 01/02/2015  . Dyslipidemia   .  Pulmonary hypertension ([place])   . DM (diabetes mellitus), type 2, uncontrolled, with renal complications ([place])   . History of pulmonary embolism 05/08/2014  . Complex sleep apnea syndrome 05/05/2014  . Anemia of chronic kidney failure 06/24/2013  . Nausea vomiting and diarrhea 06/24/2013    Orientation RESPIRATION BLADDER Height & Weight     Self, Time, Situation, Place  O2(Nasal cannula 2L) Continent Weight: 70.9 kg(pt refused to weigh on the standing scale.) Height:  _0  (188 cm)  BEHAVIORAL SYMPTOMS/MOOD NEUROLOGICAL BOWEL NUTRITION STATUS      Continent Diet(Please see DC Summary)  AMBULATORY STATUS COMMUNICATION OF NEEDS Skin   Limited Assist Verbally Surgical wounds([place] )                       Personal Care Assistance Level of Assistance  Bathing, Feeding, Dressing Bathing Assistance: Limited assistance Feeding assistance: Independent Dressing Assistance: Limited assistance     Functional Limitations Info  Sight, Hearing, Speech Sight Info: Adequate Hearing Info: Adequate Speech Info: Adequate    SPECIAL CARE FACTORS FREQUENCY  PT (By licensed PT), OT (By licensed OT)     PT Frequency: 5x/week OT Frequency: 3x/week            Contractures Contractures Info: Not present    Additional Factors Info  Code Status, Allergies, Psychotropic Code Status Info: Full Allergies Info: Butalbital-apap-caffeine, Ferrlecit Na Ferric Gluc Cplx In Sucrose, Minoxidil, Tylenol Acetaminophen, Darvocet Propoxyphene N-acetaminophen Psychotropic Info: Ambien         Current Medications (08/03/2018):  This is the  current hospital active medication list Current Facility-Administered Medications  Medication Dose Route Frequency Provider Last Rate Last Dose  . 0.9 %  sodium chloride infusion  100 mL Intravenous PRN Penninger, Ria Comment, PA      . 0.9 %  sodium chloride infusion  100 mL Intravenous PRN Penninger, Ria Comment, PA      . amLODipine (NORVASC) tablet 5 mg  5 mg  Oral Daily Anderson, Chelsey L, DO   5 mg at 08/02/18 0854  . apixaban (ELIQUIS) tablet 5 mg  5 mg Oral Q12H Andrena Mews T, MD   5 mg at 08/02/18 2218  . busPIRone (BUSPAR) tablet 5 mg  5 mg Oral BID Ouida Sills, Chelsey L, DO   5 mg at 08/02/18 2219  . Chlorhexidine Gluconate Cloth 2 % PADS 6 each  6 each Topical Q0600 Penninger, [place], [place]      . cinacalcet (SENSIPAR) tablet 90 mg  90 mg Oral QPM Anderson, Chelsey L, DO   90 mg at 07/31/18 1804  . [START ON 08/07/2018] Darbepoetin Alfa (ARANESP) injection 200 mcg  200 mcg Intravenous Q Fri-HD Penninger, [place], [place]      . diclofenac sodium (VOLTAREN) 1 % transdermal gel 2 g  2 g Topical BID PRN Ouida Sills, Chelsey L, DO   2 g at 08/02/18 0007  . dicyclomine (BENTYL) capsule 10 mg  10 mg Oral QID PRN Anderson, Chelsey L, DO      . feeding supplement (NEPRO CARB STEADY) liquid 237 mL  237 mL Oral BID BM McDiarmid, Blane Ohara, MD 0 mL/hr at 07/30/18 1257 237 mL at 08/02/18 1710  . hydrALAZINE (APRESOLINE) tablet 100 mg  100 mg Oral TID Doristine Mango L, DO   100 mg at 08/02/18 2218  . lanthanum (FOSRENOL) chewable tablet 1,000 mg  1,000 mg Oral TID WC Anderson, Chelsey L, DO   1,000 mg at 08/02/18 0855  . lidocaine (LIDODERM) 5 % 1 patch  1 patch Transdermal Q12H PRN Mullis, Kiersten P, DO   1 patch at 08/02/18 1711  . lipase/protease/amylase (CREON) capsule 12,000 Units  12,000 Units Oral TID AC Anderson, Chelsey L, DO   12,000 Units at 08/02/18 0855  . multivitamin (RENA-VIT) tablet 1 tablet  1 tablet Oral QHS Anderson, Chelsey L, DO   1 tablet at 08/02/18 2219  . nitroGLYCERIN (NITROSTAT) SL tablet 0.4 mg  0.4 mg Sublingual Q5 min PRN Ouida Sills, Chelsey L, DO      . ondansetron (ZOFRAN) tablet 8 mg  8 mg Oral Q8H PRN Anderson, Chelsey L, DO   8 mg at 07/30/18 4709  . oxyCODONE (Oxy IR/ROXICODONE) immediate release tablet 10 mg  10 mg Oral Q4H PRN Mullis, Kiersten P, DO   10 mg at 08/03/18 0335  . pantoprazole (PROTONIX) EC tablet 40 mg  40 mg Oral  Daily Anderson, Chelsey L, DO   40 mg at 08/02/18 0854  . polyethylene glycol (MIRALAX / GLYCOLAX) packet 17 g  17 g Oral Daily PRN Ouida Sills, Chelsey L, DO      . senna-docusate (Senokot-S) tablet 2 tablet  2 tablet Oral QHS PRN Anderson, Chelsey L, DO      . sodium chloride flush (NS) 0.9 % injection 5 mL  5 mL Intracatheter Q8H Corrie Mckusick, DO   5 mL at 08/01/18 2145  . zolpidem (AMBIEN) tablet 5 mg  5 mg Oral QHS Anderson, Chelsey L, DO   5 mg at 08/02/18 2217     Discharge Medications: Please see discharge summary for  a list of discharge medications.  Relevant Imaging Results:  Relevant Lab Results:   Additional Information [id number]; Dialysis: [place] MWF  Benard Halsted, LCSW

## 2018-08-03 NOTE — Progress Notes (Signed)
[name]: [phone]  Patient name: Frank [name] Medical record number: [id number] Date of birth: 1966-06-04 Age: 53 y.o. Gender: male  Primary Care Provider: Patient, [name] Consultants: Nephrology, IR, CVTS Code Status: Full  Pt Overview and Major Events to Date:  Admitted to [facility] on 07/25/18 1/4: CXR: oculated right sided pleural effusion improved from last xray with possible extrapleural air component at right lung base seen on previous CT 12/2 1/5: CT: pleural effusion and pleural thickening in the R hemithorax with large loculated anterolateral hydropneumothroax in R chest, favoring recurrent empyema and surgery 1/7: Right pleural pigtail catheter placed by IR 1/8: CXR small residual right hydropneumothorax 1/10: PT/OT rec SNF, pt agreeable 1/12: Right pleural pigtail removed   Assessment and Plan: Frank [name] Frank [name] a 53 y.o.malepresenting with SOB, R-sided chest pain, nausea/vomiting, diarrhea. PMH is significant forright sided empyema with thoracotomy 06/2018, VTE on Eliquis, chronic pancreatitis.   Acute hypoxiawithout respiratory distress  Recurrent pleural effusions  Hydropneumothorax - Improving Pleural pigtail catheter removed on 1/12. CXR this AM with . Pleural cultures without growth.  CVTS following for recurrent pleural effusion. Repeat CXR in AM with minimal increase in pleural air component of right hydropneumothorax. Currently on RA with no increased work of breathing and stable vitals. Per CVTS, recommend repeat CXR in AM. - CVTS following, appreciate recs, follow up repeat CXR - IR following, appreciate recommendations - PT/OT consulted, recommend SNF, CSW consulted for placement - Continue Eliquis  - Continue PO Oxy 62m q4 hours PRN    - Lidocaine PRN for pain, topical voltaren gel PRN - Zofran PRN  ESRD(mwf): Nephrology following.Last HD 1/11. Next HD was today, but patient refused due to  inability to get IV Dilaudid and would not accept his PO pain medications. Appears euvolemic on exam with clear breath sounds. EDW 74kg. HD planned for tomorrow. Hyponatremia of 132, unchanged from yesterday. Nephro aware. - Nephrology following, appreciate recommendations - renal diet  H/o Chronic pancreatitis with acute N/V/[place]: resolved Denies any current symptoms. Denies constipation.  Home meds: creon 12000 units TID, multivitamin daily, nutritional supplements.  - Continue to encourage oral hydration - I/O monitoring - monitor for constipation as patient is on opioids - Continue bentyl 136mQID PRN - continue creon TID before meals - continue multivitamin - nutrition consulted - recommend Nepro Shake BID  Anemia of chronic disease: stable stable. hgb range from 7-8. Transfusion threshold <7.0. - continue to monitor - nephrology following, appreciate recommendations  - Will follow-up Hgb if any signs of bleeding  Chronic painrequiringopioids Secondary to thoracotomy and chronic pancreatitis.[place] controlled substance database reviewed. Of note, patient has been seen by 8 different physicians within the last 6 months for opioids. Will limit opioid pain meds, continue home pain meds. - Continue PO Oxy 1044m4 hrs PRN - Of note, FMTS would like to be sole prescriber on opioids for patient during this admission  Depression/anxiety Home meds: alprazolam .5mg66mD PRN, buspar 5mg 59m, zolpidem 5mg Q68m- continue buspar - continue zolpidem - hold alprazolam(has not been filled since July 2019)  Chronic combined CHF home meds: nitrostat PRN - continue nitrostat PRN  HTN: stable Home meds: amlodipine 5mg da30m, hydralazine 100mg TI6mP this AM 131/97. Currently on home meds. - continue home amlodipine and hydralazine - monitor BP per floor protocol   Ichthyosis (unknown type)-diffuse cracked skin - moisturizers PRN  FEN/GI:renal diet + Nepro Shake BID, fluid  restriction, Protonix, Miralax,  Senokot Prophylaxis:Eliquis  Disposition: Pending CVTS recs, anticipate discharge to SNF, patient in agreement.  Subjective:  In HD this morning during exam. Complains of pain along chest wall. Denies any nausea, vomiting, diarrhea or constipation. Feels comfortable on room air.  Objective: Temp:  [97.9 F (36.6 C)-98.1 F (36.7 C)] (P) 97.9 F (36.6 C) (01/13 0900) Pulse Rate:  [77] 77 (01/12 2200) BP: (131-137)/(82-97) 131/97 (01/13 1111) Physical Exam: General: lying comfortably in HD bed on his left side, well nourished, well developed, NAD with non-toxic appearance HEENT: normocephalic, atraumatic, moist mucous membranes CV: regular rate and rhythm without murmurs, rubs, or gallops, <2 sec cap refill, 2+ radial and pedal pulses bilaterally Lungs: clear to auscultation bilaterally with normal work of breathing Abdomen: soft, non-tender, non-distended, normoactive bowel sounds Skin: warm, diffusely dry, dressings intact on right chest wall without drainage Extremities: warm and well perfused, normal tone, no edema MSK: right chest wall tender to palpation  Laboratory: Recent Labs  Lab 07/31/18 0633 08/01/18 1402 08/02/18 0938  WBC 6.0 6.0 6.1  HGB 8.6* 8.8* 8.7*  HCT 27.9* 28.7* 29.2*  PLT 173 182 162   Recent Labs  Lab 07/30/18 1204 08/01/18 1402 08/02/18 0938  NA 134* 132* 132*  K 4.5 4.2 3.9  CL 97* 94* 94*  CO2 _0 BUN 26* 19 12  CREATININE 10.49* 8.96* 6.46*  CALCIUM 7.8* 7.2* 7.5*  GLUCOSE 117* 85 112*    Imaging/Diagnostic Tests: Dg Chest Port 1 View  Result Date: 08/03/2018 CLINICAL DATA:  Chest pain after chest tube removal. EXAM: PORTABLE CHEST 1 VIEW COMPARISON:  1 day prior FINDINGS: Midline trachea. Moderate cardiomegaly. Removal of small bore right-sided pleural catheter. The pleural air component of the right-sided hydropneumothorax is minimally enlarged. On the order of 5%. No left-sided effusion. Mild  interstitial edema. Worsening right base airspace disease. IMPRESSION: Removal of right-sided chest tube with minimal increase in pleural air component of the right-sided hydropneumothorax. Worsening right base airspace disease. Cardiomegaly and mild interstitial edema. Electronically Signed   By: Abigail Miyamoto M.D.   On: 08/03/2018 07:34   Dg Chest Port 1 View  Result Date: 08/02/2018 CLINICAL DATA:  Hydropneumothorax. EXAM: PORTABLE CHEST 1 VIEW COMPARISON:  Radiographs 08/01/2018 and 07/30/2018. FINDINGS: 0831 hours. Small caliber right pleural pigtail catheter remains in place. Small right-sided hydropneumothorax is stable with small locules of air and a small right-sided pleural effusion inferiorly. The underlying aeration of the right lung is improved. The left lung is clear. The heart size and mediastinal contours are stable. IMPRESSION: Stable small right-sided hydropneumothorax following pleural catheter placement. Interval improved aeration of the right lung. Electronically Signed   By: Richardean Sale M.D.   On: 08/02/2018 16:02   Danna Hefty, DO 08/03/2018, 2:33 PM PGY-1, [place] Intern pager: [phone], text pages welcome

## 2018-08-03 NOTE — Progress Notes (Signed)
Patient refused meds evening of 1/12 (daily meds with meals); documented in [facility]. Patient stated that he did not want to be awakened from sleep.   Completed patient's assessment, VS and medication administration at 2200 per patient's request. Informed patient that he did not have any IV access to administer meds if necessary; patient stated he did not want IV access.

## 2018-08-04 ENCOUNTER — Inpatient Hospital Stay (HOSPITAL_COMMUNITY): Payer: Medicare Other

## 2018-08-04 DIAGNOSIS — J939 Pneumothorax, unspecified: Secondary | ICD-10-CM

## 2018-08-04 LAB — GLUCOSE, CAPILLARY: Glucose-Capillary: 85 mg/dL (ref 70–99)

## 2018-08-04 LAB — CBC
HEMATOCRIT: 28.9 % — AB (ref 39.0–52.0)
Hemoglobin: 8.7 g/dL — ABNORMAL LOW (ref 13.0–17.0)
MCH: 24.1 pg — ABNORMAL LOW (ref 26.0–34.0)
MCHC: 30.1 g/dL (ref 30.0–36.0)
MCV: 80.1 fL (ref 80.0–100.0)
Platelets: 201 10*3/uL (ref 150–400)
RBC: 3.61 MIL/uL — AB (ref 4.22–5.81)
RDW: 18.5 % — ABNORMAL HIGH (ref 11.5–15.5)
WBC: 6.1 10*3/uL (ref 4.0–10.5)
nRBC: 0 % (ref 0.0–0.2)

## 2018-08-04 LAB — RENAL FUNCTION PANEL
Albumin: 2.1 g/dL — ABNORMAL LOW (ref 3.5–5.0)
Anion gap: 11 (ref 5–15)
BUN: 29 mg/dL — AB (ref 6–20)
CO2: 27 mmol/L (ref 22–32)
Calcium: 7.9 mg/dL — ABNORMAL LOW (ref 8.9–10.3)
Chloride: 97 mmol/L — ABNORMAL LOW (ref 98–111)
Creatinine, Ser: 9.81 mg/dL — ABNORMAL HIGH (ref 0.61–1.24)
GFR calc Af Amer: 6 mL/min — ABNORMAL LOW (ref >60)
GFR calc non Af Amer: 5 mL/min — ABNORMAL LOW (ref >60)
Glucose, Bld: 82 mg/dL (ref 70–99)
Phosphorus: 3.3 mg/dL (ref 2.5–4.6)
Potassium: 4.5 mmol/L (ref 3.5–5.1)
Sodium: 135 mmol/L (ref 135–145)

## 2018-08-04 MED ORDER — HYDROMORPHONE HCL 1 MG/ML IJ SOLN: 0.5000 mg | Freq: Once | INTRAMUSCULAR | Status: DC

## 2018-08-04 MED ORDER — HYDROMORPHONE HCL 1 MG/ML IJ SOLN
INTRAMUSCULAR | Status: AC
  Filled 2018-08-04: qty 0.5

## 2018-08-04 NOTE — Progress Notes (Addendum)
[facility] Progress Note   Subjective:   Seen and examined at bedside.  Concerned about the changes in his pain medication due to increased pain during HD post surgery.  Reminded patient per our practice policy we do not prescribe pain medications.  Patient states he is aware of our policy.  No other new complaints.  States breathing is ok.    Objective Vitals:   08/03/18 1516 08/03/18 1517 08/03/18 1518 08/03/18 2245  BP: (!) 143/89 (!) 143/89 (!) 143/89 (!) 148/93  Pulse: 76 76  80  Resp:  18    Temp: 98.1 F (36.7 C) 98.1 F (36.7 C)  98.5 F (36.9 C)  TempSrc: Oral   Oral  SpO2: 98% 98%  98%  Weight:      Height:       Physical Exam General:NAD, chronically ill appearing male, laying in bed Heart:RRR, no mrg Lungs:mostly CTAB, BS decreased on R Extremities:no LE edema Dialysis Access: LU AVF    Filed Weights   07/31/18 0536 08/01/18 1340 08/01/18 1742  Weight: 72.8 kg 73.8 kg 70.9 kg   No intake or output data in the 24 hours ending 08/04/18 1101  Additional Objective Labs: Basic Metabolic Panel: Recent Labs  Lab 07/30/18 1204 08/01/18 1402 08/02/18 0938 08/03/18 1513  NA 134* 132* 132* 134*  K 4.5 4.2 3.9 4.1  CL 97* 94* 94* 97*  CO2 _0 GLUCOSE 117* 85 112* 100*  BUN 26* _1 CREATININE 10.49* 8.96* 6.46* 8.06*  CALCIUM 7.8* 7.2* 7.5* 7.6*  PHOS 4.3 3.2 2.8  --    Liver Function Tests: Recent Labs  Lab 07/30/18 1204 08/01/18 1402 08/02/18 0938  ALBUMIN 2.1* 2.2* 2.2*   CBC: Recent Labs  Lab 07/29/18 0404 07/30/18 1204 07/31/18 0633 08/01/18 1402 08/02/18 0938  WBC 4.3 1.4* 6.0 6.0 6.1  NEUTROABS  --   --  3.3  --   --   HGB 7.9* 8.4* 8.6* 8.8* 8.7*  HCT 26.3* 27.5* 27.9* 28.7* 29.2*  MCV 80.4 81.8 81.8 81.5 80.4  PLT 199 187 173 182 162   Blood Culture    Component Value Date/Time   SDES PLEURAL 07/29/2018 1300   SDES PLEURAL 07/29/2018 1300   SPECREQUEST NONE 07/29/2018 1300   SPECREQUEST NONE  07/29/2018 1300   CULT  07/29/2018 1300    NO GROWTH 5 DAYS Performed at [facility] [address] [place], [place] [address]    REPTSTATUS 08/03/2018 FINAL 07/29/2018 1300   REPTSTATUS 07/29/2018 FINAL 07/29/2018 1300   CBG: Recent Labs  Lab 07/29/18 2350  GLUCAP 85   Studies/Results: Dg Chest 2 View  Result Date: 08/04/2018 CLINICAL DATA:  Follow-up RIGHT hydropneumothorax. EXAM: CHEST - 2 VIEW COMPARISON:  08/03/2018 and earlier. FINDINGS: Cardiac silhouette markedly enlarged, unchanged. RIGHT apicolateral pneumothorax and likely loculated RIGHT basilar hydropneumothorax, unchanged since yesterday. Pulmonary venous hypertension without overt edema, unchanged since yesterday. Consolidation in the RIGHT LOWER LOBE, unchanged. No new pulmonary parenchymal abnormalities. IMPRESSION: 1. Stable RIGHT apicolateral pneumothorax and likely loculated RIGHT basilar hydropneumothorax compared to yesterday. 2. Stable atelectasis and/or pneumonia in the RIGHT LOWER LOBE. 3. Stable marked cardiomegaly without pulmonary edema. 4. No new abnormalities. Electronically Signed   By: Evangeline Dakin M.D.   On: 08/04/2018 09:09   Dg Chest Port 1 View  Result Date: 08/03/2018 CLINICAL DATA:  Chest pain after chest tube removal. EXAM: PORTABLE CHEST 1 VIEW COMPARISON:  1 day prior FINDINGS:  Midline trachea. Moderate cardiomegaly. Removal of small bore right-sided pleural catheter. The pleural air component of the right-sided hydropneumothorax is minimally enlarged. On the order of 5%. No left-sided effusion. Mild interstitial edema. Worsening right base airspace disease. IMPRESSION: Removal of right-sided chest tube with minimal increase in pleural air component of the right-sided hydropneumothorax. Worsening right base airspace disease. Cardiomegaly and mild interstitial edema. Electronically Signed   By: Abigail Miyamoto M.D.   On: 08/03/2018 07:34    Medications:  . amLODipine  5 mg Oral Daily  .  apixaban  5 mg Oral Q12H  . busPIRone  5 mg Oral BID  . Chlorhexidine Gluconate Cloth  6 each Topical Q0600  . cinacalcet  90 mg Oral QPM  . [START ON 08/07/2018] darbepoetin (ARANESP) injection - DIALYSIS  200 mcg Intravenous Q Fri-HD  . feeding supplement (NEPRO CARB STEADY)  237 mL Oral BID BM  . hydrALAZINE  100 mg Oral TID  .  HYDROmorphone (DILAUDID) injection  0.5 mg Intravenous Once  . lanthanum  1,000 mg Oral TID WC  . lipase/protease/amylase  12,000 Units Oral TID AC  . multivitamin  1 tablet Oral QHS  . pantoprazole  40 mg Oral Daily  . sodium chloride flush  5 mL Intracatheter Q8H  . zolpidem  5 mg Oral QHS    Dialysis Orders: [facility] MWF  4h 400/800 74kg 2/2.25 bath Hep none LUA AVF No heparin  -Hectorol 75mg IV TIW -Venofer 1051mIV x 10 (started 1/3)  -Mircera 225 mcg IV 2 weeks (last 1/3)  Assessment/Plan: 1.CP/Dyspnea - Rrecurrentpleural effusion:s/p thoracotomy/ drainage and decortication12/2019at [facility]. Returned w/ R chest pain andresidualPTX, s/pR pigtail drain placed 07/28/18 by IR, removed 1/12 by CTS.Cultures NGTD.CXR1/8showed small R residual hydropneumothorax. Per primary/IR. 2. ESRD -on HD MWF. K 4.1. HD scheduled for today.  3. Anemia of CKD-Hgb 8.7. Continue Aranesp 20066mqwk on Friday. 4. Secondary hyperparathyroidism -Ca and phos in goal.Continue VDRA/binders/Sensipar. Follow trends. 5. HTN/volume -BPelevated.Continue home meds. Does not appear volume overloaded on exam, under EDW, will likely need new EDW at d/c. Post HD weight on 1/11 ~71kg. 6. Nutrition -Renal diet w/fluid restrictions, renal vitamins, protein supplements. Alb 2.2 7. Chronic pancreatitis/nausea/vomiting - per primary 8. Opioid dependence: Following plan per family medicine team, they would like to remain sole provider of pain medication - encouraging PO meds only.    LinJen MowA-C [facility] Pager: [phone]/2020,11:01  AM  LOS: 9 days

## 2018-08-04 NOTE — Progress Notes (Addendum)
[name]: [phone]  Patient name: Frank [name] Medical record number: [id number] Date of birth: [birth year] Age: 53 y.o. Gender: male  Primary Care Provider: Patient, [name] Consultants: Nephrology, IR, CVTS Code Status: Full  Pt Overview and Major Events to Date:  Admitted to [facility] on 07/25/18 1/4: CXR: oculated right sided pleural effusion improved from last xray with possible extrapleural air component at right lung base seen on previous CT 12/2 1/5: CT: pleural effusion and pleural thickening in the R hemithorax with large loculated anterolateral hydropneumothroax in R chest, favoring recurrent empyema and surgery 1/7: Right pleural pigtail catheter placed by IR 1/8: CXR small residual right hydropneumothorax 1/10: PT/OT rec SNF, pt agreeable 1/12: Right pleural pigtail removed  1/14 Medically cleared for discharge  Assessment and Plan: [name] a 53 y.o.malepresenting with SOB, R-sided chest pain, nausea/vomiting, diarrhea. PMH is significant forright sided empyema with thoracotomy 06/2018, VTE on Eliquis, chronic pancreatitis.   Acute hypoxiawithout respiratory distress  Recurrent pleural effusions  Hydropneumothorax  Right Airpace Disease - Improving Pleural pigtail catheter removed on 1/12. CVTS following. Believe residual space lateral to lung and right pleural cavity is 2/2 to entrapment of lung from pleural peel, likely will be permenant. Repeat CXR this AM with stable. Pleural cultures neg x 5 days.  Afbrile and hemodynamically stable overnight, currently on RA with no increased work of breathing and stable vitals. CSW consulted and working on placement to SNF at discharge. - CVTS following, appreciate recs, no further therapy needed - IR following, appreciate recommendations - PT/OT consulted, recommend SNF, CSW consulted for placement, will follow up - Continue Eliquis  - Continue PO Oxy 90m q4 hours PRN     - Lidocaine PRN for pain, topical voltaren gel PRN - Zofran PRN  ESRD(mwf): Nephrology following.Last HD 1/11. Refused HD yesterday due to inability to get pain meds. Next HD scheduled for today. Will only go if he gets IV Dilaudid. Appears euvolemic on exam with clear breath sounds. EDW 74kg. Hyponatremia improved 132>134 this Am.  - Nephrology following, appreciate recommendations - renal diet - IV Dilaudid 0.560mx 1 prior to HD  H/o Chronic pancreatitis  acute N/V/Diarrea: resolved Denies any current symptoms. Denies constipation.  Home meds: creon 12000 units TID, multivitamin daily, nutritional supplements.  - Continue to encourage oral hydration - I/O monitoring - Continue bentyl 1029mID PRN, creon TID before meals, multivitamin - nutrition consulted - recommend Nepro Shake BID  Anemia of chronic disease: stable stable. hgb range from 7-8. Transfusion threshold <7.0. - continue to monitor - nephrology following, appreciate recommendations  - Will follow-up Hgb if any signs of bleeding  Chronic painrequiringopioids Secondary to thoracotomy and chronic pancreatitis.[place] controlled substance database reviewed. Of note, patient has been seen by 8 different physicians within the last 6 months for opioids. Will limit opioid pain meds, continue home pain meds. - Continue PO Oxy 70m24m hrs PRN - Of note, [facility] would like to be sole prescriber on opioids for patient during this admission  Depression/anxiety Home meds: alprazolam .5mg 36m PRN, buspar 5mg B56m zolpidem 5mg QH62m continue buspar - continue zolpidem - hold alprazolam(has not been filled since July 2019)  Chronic combined CHF home meds: nitrostat PRN - continue nitrostat PRN  HTN: stable Home meds: amlodipine 5mg dai40m hydralazine 100mg TID74m this AM 148/93. Expect elevation due to missed HD. Currently on home meds. - continue home amlodipine and hydralazine - monitor BP  per floor protocol    Ichthyosis (unknown type)-diffuse cracked skin - moisturizers PRN  FEN/GI:renal diet + Nepro Shake BID, fluid restriction, Protonix, Miralax, Senokot Prophylaxis:Eliquis  Disposition: Clinically stable for DC, awaiting SNF placement  Subjective:  Eating breakfast this morning. Reports pain along chest, same as yesterday. Upset at doctors for not giving IV dilaudid. Denies any nausea, vomiting, diarrhea or constipation. Breathing comfortable on room air.  Objective: Temp:  [98.1 F (36.7 C)-98.5 F (36.9 C)] 98.5 F (36.9 C) (01/13 2245) Pulse Rate:  [76-80] 80 (01/13 2245) Resp:  [18] 18 (01/13 1517) BP: (143-148)/(89-93) 148/93 (01/13 2245) SpO2:  [98 %] 98 % (01/13 2245) Physical Exam: General: well nourished, well developed, in no acute distress with non-toxic appearance, sitting up eating breakfast  HEENT: normocephalic, atraumatic, moist mucous membranes CV: regular rate and rhythm without murmurs, rubs, or gallops, no lower extremity edema Lungs: clear to auscultation bilaterally with normal work of breathing Abdomen: soft, non-tender, non-distended, normoactive bowel sounds Skin: warm, dry, no rashes or lesions Extremities: warm and well perfused Neuro: Alert and oriented, speech normal   Laboratory: Recent Labs  Lab 07/31/18 0633 08/01/18 1402 08/02/18 0938  WBC 6.0 6.0 6.1  HGB 8.6* 8.8* 8.7*  HCT 27.9* 28.7* 29.2*  PLT 173 182 162   Recent Labs  Lab 08/01/18 1402 08/02/18 0938 08/03/18 1513  NA 132* 132* 134*  K 4.2 3.9 4.1  CL 94* 94* 97*  CO2 _0 BUN _1 CREATININE 8.96* 6.46* 8.06*  CALCIUM 7.2* 7.5* 7.6*  GLUCOSE 85 112* 100*    Imaging/Diagnostic Tests: Dg Chest 2 View  Result Date: 08/04/2018 CLINICAL DATA:  Follow-up RIGHT hydropneumothorax. EXAM: CHEST - 2 VIEW COMPARISON:  08/03/2018 and earlier. FINDINGS: Cardiac silhouette markedly enlarged, unchanged. RIGHT apicolateral pneumothorax and likely loculated RIGHT  basilar hydropneumothorax, unchanged since yesterday. Pulmonary venous hypertension without overt edema, unchanged since yesterday. Consolidation in the RIGHT LOWER LOBE, unchanged. No new pulmonary parenchymal abnormalities. IMPRESSION: 1. Stable RIGHT apicolateral pneumothorax and likely loculated RIGHT basilar hydropneumothorax compared to yesterday. 2. Stable atelectasis and/or pneumonia in the RIGHT LOWER LOBE. 3. Stable marked cardiomegaly without pulmonary edema. 4. No new abnormalities. Electronically Signed   By: Evangeline Dakin M.D.   On: 08/04/2018 09:09   Dg Chest Port 1 View  Result Date: 08/03/2018 CLINICAL DATA:  Chest pain after chest tube removal. EXAM: PORTABLE CHEST 1 VIEW COMPARISON:  1 day prior FINDINGS: Midline trachea. Moderate cardiomegaly. Removal of small bore right-sided pleural catheter. The pleural air component of the right-sided hydropneumothorax is minimally enlarged. On the order of 5%. No left-sided effusion. Mild interstitial edema. Worsening right base airspace disease. IMPRESSION: Removal of right-sided chest tube with minimal increase in pleural air component of the right-sided hydropneumothorax. Worsening right base airspace disease. Cardiomegaly and mild interstitial edema. Electronically Signed   By: Abigail Miyamoto M.D.   On: 08/03/2018 07:34   Danna Hefty, DO 08/04/2018, 12:20 PM PGY-1, [place] Intern pager: [phone], text pages welcome

## 2018-08-04 NOTE — Progress Notes (Signed)
[facility] requesting morning transport due to not wanting the patient to arrive late. CSW will arrange PTAR before 10am.   Cedric Fishman LCSW [phone]

## 2018-08-04 NOTE — Progress Notes (Signed)
Patient was comfortable and lying on bed. I discussed about dialysis today when he demanded IV dilaudid during the treatment. He doesn't want to try oral pain medications first. He was verbally abusive to this provider.  I called the FM attending Dr. Gwendlyn Deutscher and discussed the decision. She also doesn't feel like he needs IV dilaudid.  Dr. Gwendlyn Deutscher will discuss with the patient during round.  I informed this discussion with the patient in the presence of his nurse.

## 2018-08-04 NOTE — Progress Notes (Signed)
PT Cancellation Note  Patient Details Name: Frank [name] MRN: [id number] DOB: 1966/06/28   Cancelled Treatment:    Reason Eval/Treat Not Completed: (P) Patient declined, no reason specified(Pt reports he is not ready and will try later.  Will f/u per POC.  )   Aliscia Clayton [name] 08/04/2018, 12:01 PM  Governor Rooks, PTA Acute Rehabilitation Services Pager [phone] Office [phone]

## 2018-08-04 NOTE — Progress Notes (Signed)
PT Cancellation Note  Patient Details Name: [name] MRN: [id number] DOB: 06/03/1966   Cancelled Treatment:    Reason Eval/Treat Not Completed: (P) Patient at procedure or test/unavailable(Pt off unit at dialysis, will f/u per POC.  )   [name] [name] 08/04/2018, 3:45 PM  Governor Rooks, PTA Acute Rehabilitation Services Pager [phone] Office [phone]

## 2018-08-05 ENCOUNTER — Non-Acute Institutional Stay (SKILLED_NURSING_FACILITY): Payer: Medicare Other | Admitting: Adult Health

## 2018-08-05 ENCOUNTER — Encounter: Payer: Self-pay | Admitting: Adult Health

## 2018-08-05 DIAGNOSIS — Z86718 Personal history of other venous thrombosis and embolism: Secondary | ICD-10-CM | POA: Diagnosis not present

## 2018-08-05 DIAGNOSIS — E440 Moderate protein-calorie malnutrition: Secondary | ICD-10-CM | POA: Diagnosis not present

## 2018-08-05 DIAGNOSIS — K219 Gastro-esophageal reflux disease without esophagitis: Secondary | ICD-10-CM

## 2018-08-05 DIAGNOSIS — I5020 Unspecified systolic (congestive) heart failure: Secondary | ICD-10-CM | POA: Diagnosis not present

## 2018-08-05 DIAGNOSIS — N186 End stage renal disease: Secondary | ICD-10-CM

## 2018-08-05 DIAGNOSIS — F419 Anxiety disorder, unspecified: Secondary | ICD-10-CM | POA: Diagnosis not present

## 2018-08-05 DIAGNOSIS — N185 Chronic kidney disease, stage 5: Secondary | ICD-10-CM

## 2018-08-05 DIAGNOSIS — I132 Hypertensive heart and chronic kidney disease with heart failure and with stage 5 chronic kidney disease, or end stage renal disease: Secondary | ICD-10-CM

## 2018-08-05 DIAGNOSIS — Z992 Dependence on renal dialysis: Secondary | ICD-10-CM | POA: Diagnosis not present

## 2018-08-05 DIAGNOSIS — G4709 Other insomnia: Secondary | ICD-10-CM | POA: Diagnosis not present

## 2018-08-05 DIAGNOSIS — J939 Pneumothorax, unspecified: Secondary | ICD-10-CM

## 2018-08-05 DIAGNOSIS — K861 Other chronic pancreatitis: Secondary | ICD-10-CM | POA: Diagnosis not present

## 2018-08-05 DIAGNOSIS — J948 Other specified pleural conditions: Secondary | ICD-10-CM

## 2018-08-05 NOTE — Progress Notes (Signed)
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 sterile gloves were used for the procedure. Local anesthesia was provided with 1% Lidocaine. Using CT guidance, 18 gauge trocar needle was advanced into the complex fluid collection of the lower right chest. Modified Seldinger technique was used to place a 10 [place] drain into the pleural space. Catheter was attached to water seal aspiration chamber and sutured in position. Final images were stored and sent to PACs. Catheter was sutured in position. Patient  tolerated procedure well and remained hemodynamically stable throughout. No complications were encountered and no significant blood loss. FINDINGS: CT images of the chest demonstrate pleural thickening on the right with complex fluid collection and multiple loculations. Ten [place] drain terminates within the costophrenic sulcus within the fluid collection. IMPRESSION: Status post CT-guided right thoracostomy tube placement. Signed, Dulcy Fanny. Dellia Nims, RPVI [facility] Radiology Electronically Signed   By: Corrie Mckusick D.O.   On: 07/28/2018 16:25    Assessment/Plan  1. Hydropneumothorax  - S/P CT guided right pleural  pigtail catheter place with chest tube suction on 1/7. Fluid was cultured and was negative X 5 days. Upon adequate drainage and improvement on chest x-ray, chest tube was removed. Repeat chest x-ray was stable without evidence of infection. No antibiotics were initiated since white count were normal and was afebrile throughout the entire admission; follow-up with cardiothoracic, Dr. Tharon Aquas Trigt, on 08/19/18; follow-up with cardiology, Dr. Quay Burow   2. History of DVT (deep vein thrombosis) -continue Eliquis 5 mg 1 tab twice a day   3. Benign hypertensive heart and kidney disease with systolic CHF, NYHA class 3 and CKD stage 5 (HCC) - stable, continue hydralazine 100 mg 1 tab 3 times a day, Norvasc 5 mg 1 tab daily   4. End-stage renal disease on hemodialysis ([place]) -continue hemodialysis 3 times/week, Fosrenol 1000 mg 1 tab 3 times with meals, Sensipar 90 mg 1 tab q. evening   5. Gastroesophageal reflux disease without esophagitis -continue Protonix 40 mg 1 tab daily   6. Other insomnia -continue Ambien 5 mg 1 tab nightly   7. Other chronic pancreatitis ([place]) -continue Creon 12,000 units 1 capsule 3 times a day with meals, Oxycodone was discontinued before discharge   8. Malnutrition of moderate degree - start Magic cup BID   9. Anxiety -continue BuSpar 5 mg 1 tab twice a day     Family/ staff Communication: Discussed plan of care with patient.  Labs/tests ordered:  None  Goals of care:   Short-term rehabilitation.   Durenda Age, NP [facility] [phone] (Monday-Friday 8:00 a.m. - 5:00 p.m.) [phone] (after hours)

## 2018-08-05 NOTE — Progress Notes (Signed)
Patient will DC to: [facility] Anticipated DC date: 08/05/18 Family notified: Patient declined Transport by: [facility]   Per MD patient ready for DC to [facility]. RN, patient, patient's family, and facility notified of DC. Discharge Summary and FL2 sent to facility. RN to call report prior to discharge [phone] or [phone]). DC packet on chart. Ambulance transport requested for patient.   CSW will sign off for now as social work intervention is no longer needed. Please consult [place] again if new needs arise.  Cedric Fishman, LCSW Clinical Social Worker [phone]

## 2018-08-05 NOTE — Clinical Social Work Placement (Signed)
   CLINICAL SOCIAL WORK PLACEMENT  NOTE  Date:  08/05/2018  Patient Details  Name: Frank [name] MRN: [id number] Date of Birth: 02/04/1966  Clinical Social Work is seeking post-discharge placement for this patient at the [place] level of care (*CSW will initial, date and re-position this form in  chart as items are completed):  Yes   Patient/family provided with [facility] Work Department's list of facilities offering this level of care within the geographic area requested by the patient (or if unable, by the patient's family).  Yes   Patient/family informed of their freedom to choose among providers that offer the needed level of care, that participate in Medicare, Medicaid or managed care program needed by the patient, have an available bed and are willing to accept the patient.  Yes   Patient/family informed of [facility]'s ownership interest in [facility] and [facility], as well as of the fact that they are under no obligation to receive care at these facilities.  PASRR submitted to EDS on       PASRR number received on       Existing PASRR number confirmed on 08/05/18     FL2 transmitted to all facilities in geographic area requested by pt/family on 08/05/18     FL2 transmitted to all facilities within larger geographic area on       Patient informed that his/her managed care company has contracts with or will negotiate with certain facilities, including the following:        Yes   Patient/family informed of bed offers received.  Patient chooses bed at [facility] recommends and patient chooses bed at      Patient to be transferred to [facility] on 08/05/18.  Patient to be transferred to facility by [facility]     Patient family notified on 08/05/18 of transfer.  Name of family member notified:  Patient alerting roommate     PHYSICIAN       Additional Comment:     _______________________________________________ Benard Halsted, LCSW 08/05/2018, 8:28 AM

## 2018-08-05 NOTE — Progress Notes (Signed)
Frank [name] to be D/C'd to [facility] per MD order. Report called to [facility], LPN. VVS, Skin clean, dry and intact without evidence of skin break down, no evidence of skin tears noted.  An After Visit Summary was printed and given to the patient.  Patient escorted via stretcher, and D/C to [facility] via [place].  Frank [name]  08/05/2018 10:58 AM

## 2018-08-05 NOTE — Discharge Summary (Addendum)
[facility] Discharge Summary  Patient name: Frank [name] Medical record number: [id number] Date of birth: December 30, 1965 Age: 53 y.o. Gender: male Date of Admission: 07/25/2018  Date of Discharge: 08/05/2018 Admitting Physician: Richarda Osmond, DO  Primary Care Provider: Patient, [name] Consultants: Nephrology, IR, CVTS  Indication for Hospitalization: Right sided chest pain, SOB  Discharge Diagnoses/Problem List:  Patient Active Problem List   Diagnosis Date Noted  . Malnutrition of moderate degree 07/29/2018  . Chest tube in place   . Chronic, continuous use of opioids 07/28/2018  . Chest pain   . Chronic vomiting 07/26/2018  . Empyema of right pleural space ([place]) 07/26/2018  . Chronic pancreatitis ([place]) 05/09/2018  . Dialysis patient, noncompliant ([place]) 03/05/2018  . Hydropneumothorax 01/31/2018  . Benign hypertensive heart and kidney disease with systolic CHF, NYHA class 3 and CKD stage 5 ([place])   . End-stage renal disease on hemodialysis ([place])   . DM (diabetes mellitus), type 2, uncontrolled, with renal complications ([place])   . History of pulmonary embolism 05/08/2014  . Complex sleep apnea syndrome 05/05/2014  . Anemia of chronic kidney failure 06/24/2013  . Nausea vomiting and diarrhea 06/24/2013   Disposition: SNF  Discharge Condition: Improved and stable  Discharge Exam:  General: well nourished, well developed, in no acute distress with non-toxic appearance, lying comfortably in bed having chest bandage changed HEENT: normocephalic, atraumatic, moist mucous membranes CV: regular rate and rhythm without murmurs, rubs, or gallops, no lower extremity edema Lungs: clear to auscultation bilaterally with normal work of breathing Abdomen: soft, non-tender, non-distended, normoactive bowel sounds Skin: warm, dry, no rashes or lesions, chest incision well healing without erythema or edema, no warmth, chest tube site well healing without erythema   Extremities: warm and well perfused Neuro: Alert and oriented, speech normal  Brief Hospital Course:  Frank [name] is a 53 y.o. male with past medical history significant for ESRD on HD MWF, HTN, chronic pancreatitis, anemia of chronic disease,CHF, depression/anxiety, and chronic pain with chronic opioid use, who presented with worsening right sided chest pain and hypoxia and found to have right pleural effusion and loculated hydropneumothorax. Of note patient was status-post thoracotomy for a right sided empyema and decortication of collapsed RLL on 06/2018 with 10 day course of zosyn at [facility].    Cardiac and thoracic surgery was consulted in the ED and felt hydropnuemothorax was most likely residual space from entrapped lung of lower lobe that failed to re-expand from previous admission. Patient had CT guided right pleural pigtail catheter placed with chest tube and suction on 1/7. Fluid was drained and fluid culture was collected (negative x 5 days). Upon adequate drainage and improvement on chest x-ray, chest tube was removed. Repeat chest x-rays were stable without evidence of infection. 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 No antibiotics were initiated as patient presented with normal white count remained hemodynamically stable and afebrile throughout entire admission. He initially required oxygen to maintain saturations, but by time of discharge he was stable on room air with normal work of breathing.  CT scan on admission also noted rounded opacity in RLL with question of neoplasm. Given this finding,n patient will likely need repeat CT for further evaluation. Will defer further management to PCP.   Patient has a chronic history of pain requiring opioids expected to be secondary to thoracotomy and chronic  pancreatitis. Upon review of the [place] controlled substance  database, it was noted patient had been seen by 8 different physicians within the last 6 months for opioids. Pain management was closely monitored and he was maintained on his outpatient pain regimen only (oxycodone IR 71m QID PRN). Of note, patient would refuse hemodialysis sessions throughout admission unless he received IV dilaudid. This was granted given concern for development of hypervolemia.   At time of discharge, patient was hemodyanmicaly stable on room air and afebrile. Return precautions discussed and follow-up arranged.   Issues for Follow Up:  1. Follow up possible rounded opacity in RLL found on CT chest, consider repeat CT scan  2. Recommend monitoring pain management and opioid use closely 3. CVTS to follow up recurrent R sided pleural effusion outpatient  Significant Procedures:  CT Chest without contrast: pleural effusion and pleural thickening in the R hemithorax with large loculated anterolateral hydropneumothroax in R chest, favoring recurrent empyema; Rounded opacity in RLL ~4.7x3.8, question rounded PNA vs neoplasm CT guided placement of right pleural pigtail catheter and chest tube  Significant Labs and Imaging:  Recent Labs  Lab 08/01/18 1402 08/02/18 0938 08/04/18 1301  WBC 6.0 6.1 6.1  HGB 8.8* 8.7* 8.7*  HCT 28.7* 29.2* 28.9*  PLT 182 162 201   Recent Labs  Lab 07/30/18 1204 08/01/18 1402 08/02/18 0938 08/03/18 1513 08/04/18 1301  NA 134* 132* 132* 134* 135  K 4.5 4.2 3.9 4.1 4.5  CL 97* 94* 94* 97* 97*  CO2 _0 GLUCOSE 117* 85 112* 100* 82  BUN 26* _1 29*  CREATININE 10.49* 8.96* 6.46* 8.06* 9.81*  CALCIUM 7.8* 7.2* 7.5* 7.6* 7.9*  PHOS 4.3 3.2 2.8  --  3.3  ALBUMIN 2.1* 2.2* 2.2*  --  2.1*   Lipase: 31 Troponin: <0.03 PT/INR: 16.9/1.39 Hep B surface Ag: negative Fluid Gram stain: negative Fluid culture: NG x 5 days  Dg Chest 1 View  Result Date: 07/30/2018 CLINICAL DATA:  Hydropneumothorax. EXAM: CHEST  1 VIEW  COMPARISON:  Radiograph of July 29, 2018. FINDINGS: Stable cardiomegaly. No pneumothorax is noted. Stable left basilar opacity is noted concerning for atelectasis or infiltrate with associated pleural effusion. Stable position of right-sided pigtail drainage catheter in right lung base. Stable right basilar hydropneumothorax is noted. Bony thorax is unremarkable. IMPRESSION: Stable cardiomegaly. Stable position of right-sided pigtail drainage catheter in right lung base with stable right basilar hydropneumothorax. Stable left basilar opacity as described above. Electronically Signed   By: JMarijo Conception M.D.   On: 07/30/2018 13:56   Dg Chest 2 View  Result Date: 08/04/2018 CLINICAL DATA:  Follow-up RIGHT hydropneumothorax. EXAM: CHEST - 2 VIEW COMPARISON:  08/03/2018 and earlier. FINDINGS: Cardiac silhouette markedly enlarged, unchanged. RIGHT apicolateral pneumothorax and likely loculated RIGHT basilar hydropneumothorax, unchanged since yesterday. Pulmonary venous hypertension without overt edema, unchanged since yesterday. Consolidation in the RIGHT LOWER LOBE, unchanged. No new pulmonary parenchymal abnormalities. IMPRESSION: 1. Stable RIGHT apicolateral pneumothorax and likely loculated RIGHT basilar hydropneumothorax compared to yesterday. 2. Stable atelectasis and/or pneumonia in the RIGHT LOWER LOBE. 3. Stable marked cardiomegaly without pulmonary edema. 4. No new abnormalities. Electronically Signed   By: TEvangeline DakinM.D.   On: 08/04/2018 09:09   Dg Chest 2 View  Result Date: 07/25/2018 CLINICAL DATA:  Chest pain. EXAM: CHEST - 2 VIEW COMPARISON:  Most recent chest radiograph 06/22/2018. Lung bases from abdominal CT 06/22/2018 FINDINGS: Decreased right pleural effusion from prior radiograph,  partially loculated small volume of pleural fluid remains. Possible partially loculated extrapleural air at the right lung base, not well assessed radiographically. Patchy opacities throughout the right  hemithorax. Cardiomegaly is unchanged. Mild interstitial edema suspected. IMPRESSION: 1. Decreased right pleural effusion from prior radiograph, residual partially loculated pleural effusion persists. Possible extrapleural air component at the right lung base, as seen from included lung bases on abdominal CT 06/22/2018. 2. Chronic cardiomegaly with mild interstitial edema. Patchy opacities throughout the right hemithorax may represent asymmetric edema or superimposed infection. Electronically Signed   By: Keith Rake M.D.   On: 07/25/2018 03:56   Ct Chest Wo Contrast  Result Date: 07/26/2018 CLINICAL DATA:  Shortness of breath; history CHF, end-stage renal disease on dialysis, non ischemic cardiomyopathy, hypertension, underwent thoracotomy for empyema in December 2019. EXAM: CT CHEST WITHOUT CONTRAST TECHNIQUE: Multidetector CT imaging of the chest was performed following the standard protocol without IV contrast. IV contrast was not administered since no adequate IV access could be established. Sagittal and coronal MPR images reconstructed from axial data set. COMPARISON:  01/31/2018 CT chest Correlation: CT abdomen and pelvis 06/22/2018 FINDINGS: Cardiovascular: Scattered atherosclerotic calcifications of aorta, coronary arteries and proximal great vessels. Enlargement of cardiac chambers. Small to moderate sized pericardial effusion slightly increased from previous exam. Upper normal caliber ascending thoracic aorta. Mediastinum/Nodes: Mildly enlarged precarinal lymph node 12 mm short axis image 64. Additional AP window node 14 mm short axis image 64. Normal sized RIGHT paratracheal and prevascular nodes. Base of cervical region normal appearance. Esophagus unremarkable. Lungs/Pleura: Central peribronchial thickening. Consolidation versus mass RIGHT lower lobe 4.7 x 3.8 cm image 111. Scattered pleural effusion and pleural thickening throughout lateral and posterior RIGHT hemithorax with loculated  hydropneumothorax at the anterolateral RIGHT chest, likely representing residual or recurrent empyema in a patient with recent empyema. Basilar portion of RIGHT has been present since the prior CT exam from December 2019, though the cranial extent of this was not previously imaged. Compressive atelectasis in RIGHT upper and RIGHT middle lobes. Upper Abdomen: Atrophic upper kidneys. Probable cyst upper pole LEFT kidney 20 x 20 mm. Remaining visualized upper abdomen unremarkable. Musculoskeletal: Diffuse osseous sclerosis likely renal osteodystrophy in patient with history of renal failure. IMPRESSION: Pleural effusion and pleural thickening in the RIGHT hemithorax with a large loculated anterolateral hydropneumothorax in the RIGHT chest, favor recurrent empyema in patient with recent empyema and surgery; recommend correlation with fluid analysis. At least the basilar portion of the RIGHT hydropneumothorax has been present since December 2012 though full extent has not been previously imaged. Extensive peribronchial thickening with rounded area of opacity in the RIGHT lower lobe approximately 4.7 x 3.8 cm question rounded pneumonia versus neoplasm. Enlargement of cardiac chambers with small to moderate-sized pericardial effusion increased since previous exam. Aortic Atherosclerosis (ICD10-I70.0). Findings called to the patient's nurse Susana RN on [facility] on 07/26/2018 at 0035 hrs. Electronically Signed   By: Lavonia Dana M.D.   On: 07/26/2018 00:47   Dg Chest Port 1 View  Result Date: 08/03/2018 CLINICAL DATA:  Chest pain after chest tube removal. EXAM: PORTABLE CHEST 1 VIEW COMPARISON:  1 day prior FINDINGS: Midline trachea. Moderate cardiomegaly. Removal of small bore right-sided pleural catheter. The pleural air component of the right-sided hydropneumothorax is minimally enlarged. On the order of 5%. No left-sided effusion. Mild interstitial edema. Worsening right base airspace disease. IMPRESSION: Removal of  right-sided chest tube with minimal increase in pleural air component of the right-sided hydropneumothorax. Worsening right base airspace disease.  Cardiomegaly and mild interstitial edema. Electronically Signed   By: Abigail Miyamoto M.D.   On: 08/03/2018 07:34   Dg Chest Port 1 View  Result Date: 08/02/2018 CLINICAL DATA:  Hydropneumothorax. EXAM: PORTABLE CHEST 1 VIEW COMPARISON:  Radiographs 08/01/2018 and 07/30/2018. FINDINGS: 0831 hours. Small caliber right pleural pigtail catheter remains in place. Small right-sided hydropneumothorax is stable with small locules of air and a small right-sided pleural effusion inferiorly. The underlying aeration of the right lung is improved. The left lung is clear. The heart size and mediastinal contours are stable. IMPRESSION: Stable small right-sided hydropneumothorax following pleural catheter placement. Interval improved aeration of the right lung. Electronically Signed   By: Richardean Sale M.D.   On: 08/02/2018 16:02   Dg Chest Port 1 View  Result Date: 08/01/2018 CLINICAL DATA:  Hydropneumothorax. EXAM: PORTABLE CHEST 1 VIEW COMPARISON:  The chest x-ray 07/30/2018 FINDINGS: Heart is enlarged. Bilateral pleural effusions similar the prior study. Asymmetric edema is present on the right. Right pneumothorax has increased slightly. Locule of air is present at the base. A pigtail catheter is in place. Left upper lobe is clear. IMPRESSION: 1. Increased near component of right-sided hydropneumothorax. 2. Right base pigtail catheter stable. 3. Cardiomegaly with asymmetric right-sided edema. Electronically Signed   By: San Morelle M.D.   On: 08/01/2018 08:59   Dg Chest Port 1 View  Result Date: 07/29/2018 CLINICAL DATA:  Chest tube with chest pain EXAM: PORTABLE CHEST 1 VIEW COMPARISON:  07/25/2018 FINDINGS: Right base chest tube. There is small residual right hydropneumothorax. Cardiomegaly with pericardial effusion by CT. Small left pleural effusion. Vascular  congestion. IMPRESSION: 1. Stable basal hydropneumothorax on the right. 2. Cardiomegaly and pericardial effusion. Electronically Signed   By: Monte Fantasia M.D.   On: 07/29/2018 07:13   Ct Image Guided Drainage By Percutaneous Catheter  Result Date: 07/28/2018 INDICATION: 53 year old male with a history of prior right-sided decortication and recurrent hydropneumothorax. He has been referred for chest tube placement in attempt to collapse the potential space. EXAM: CT GUIDED RIGHT CHEST TUBE PLACEMENT MEDICATIONS: The patient is currently admitted to the hospital and receiving intravenous antibiotics. The antibiotics were administered within an appropriate time frame prior to the initiation of the procedure. ANESTHESIA/SEDATION: 1.5 mg IV Versed 75 mcg IV Fentanyl Moderate Sedation Time:  29 minutes The patient was continuously monitored during the procedure by the interventional radiology nurse under my direct supervision. COMPLICATIONS: None TECHNIQUE: Informed written consent was obtained from the patient after a thorough discussion of the procedural risks, benefits and alternatives. All questions were addressed. Maximal Sterile Barrier Technique was utilized including caps, mask, sterile gowns, sterile gloves, sterile drape, hand hygiene and skin antiseptic. A timeout was performed prior to the initiation of the procedure. PROCEDURE: Patient was positioned left decubitus. Scout CT of the chest performed. The lower right chest was prepped with chlorhexidine in a sterile fashion, and a sterile drape was applied covering the operative field. A sterile gown and sterile gloves were used for the procedure. Local anesthesia was provided with 1% Lidocaine. Using CT guidance, 18 gauge trocar needle was advanced into the complex fluid collection of the lower right chest. Modified Seldinger technique was used to place a 10 [place] drain into the pleural space. Catheter was attached to water seal aspiration chamber and  sutured in position. Final images were stored and sent to PACs. Catheter was sutured in position. Patient tolerated procedure well and remained hemodynamically stable throughout. No complications were encountered and no significant  blood loss. FINDINGS: CT images of the chest demonstrate pleural thickening on the right with complex fluid collection and multiple loculations. Ten [place] drain terminates within the costophrenic sulcus within the fluid collection. IMPRESSION: Status post CT-guided right thoracostomy tube placement. Signed, Dulcy Fanny. Dellia Nims, RPVI [facility] Electronically Signed   By: Corrie Mckusick D.O.   On: 07/28/2018 16:25     Results/Tests Pending at Time of Discharge:  None  Discharge Medications:  Allergies as of 08/05/2018      Reactions   Butalbital-apap-caffeine Shortness Of Breath, Swelling, Other (See Comments)   Swelling in throat   Ferrlecit [na Ferric Gluc Cplx In Sucrose] Shortness Of Breath, Swelling, Other (See Comments)   Swelling in throat, tolerates Venofor   Minoxidil Shortness Of Breath   Tylenol [acetaminophen] Anaphylaxis, Swelling   Darvocet [propoxyphene N-acetaminophen] Hives      Medication List    STOP taking these medications   ALPRAZolam 0.5 MG tablet Commonly known as:  XANAX   Oxycodone HCl 10 MG Tabs     TAKE these medications   amLODipine 5 MG tablet Commonly known as:  NORVASC Take 5 mg by mouth daily.   busPIRone 5 MG tablet Commonly known as:  BUSPAR Take 5 mg by mouth 2 (two) times daily.   cinacalcet 90 MG tablet Commonly known as:  SENSIPAR Take 90 mg by mouth every evening.   diclofenac sodium 1 % Gel Commonly known as:  VOLTAREN Apply 4 gram transdermaly four times daily for left shoulder pain   dicyclomine 10 MG/5ML syrup Commonly known as:  BENTYL Take 5 mLs by mouth 4 (four) times daily as needed for spasms.   diphenhydrAMINE 25 mg capsule Commonly  known as:  BENADRYL Take 25 mg by mouth every 6 (six) hours as needed for itching.   ELIQUIS 5 MG Tabs tablet Generic drug:  apixaban Take 5 mg by mouth 2 (two) times daily.   hydrALAZINE 100 MG tablet Commonly known as:  APRESOLINE Take 100 mg by mouth 3 (three) times daily.   insulin aspart 100 UNIT/ML injection Commonly known as:  novoLOG Inject into the skin 2 (two) times daily before a meal.   lanthanum 1000 MG chewable tablet Commonly known as:  FOSRENOL Chew 1,000 mg by mouth 3 (three) times daily with meals.   lipase/protease/amylase 12000 units Cpep capsule Commonly known as:  CREON Take 1 capsule (12,000 Units total) by mouth 3 (three) times daily before meals.   multivitamin Tabs tablet Take 1 tablet by mouth daily.   NEPHRO VITAMINS 0.8 MG Tabs Take 1 tablet by mouth daily.   nitroGLYCERIN 0.4 MG SL tablet Commonly known as:  NITROSTAT Place 0.4 mg under the tongue every 5 (five) minutes as needed for chest pain.   NUTRITIONAL SUPPLEMENT PO Liberalized Renal Diet - Regular Texture   ondansetron 4 MG disintegrating tablet Commonly known as:  ZOFRAN-ODT Take 4 mg by mouth every 8 (eight) hours as needed for nausea/vomiting.   OXYGEN O2 via nasal cannula at 2L/min for O2 sats 90% or below as needed for shortness of breath   pantoprazole 40 MG tablet Commonly known as:  PROTONIX Take 1 tablet (40 mg total) by mouth daily.   polyethylene glycol packet Commonly known as:  MIRALAX / GLYCOLAX Take 17 g by mouth daily. What changed:    when to take this  reasons to take this   senna-docusate 8.6-50 MG tablet Commonly known as:  Senokot-S Take  2 tablets by mouth at bedtime. What changed:    when to take this  reasons to take this   zolpidem 5 MG tablet Commonly known as:  AMBIEN Take 1 tablet (5 mg total) by mouth at bedtime.       Discharge Instructions: Please refer to Patient Instructions section of EMR for full details.  Patient was  counseled important signs and symptoms that should prompt return to medical care, changes in medications, dietary instructions, activity restrictions, and follow up appointments.   Follow-Up Appointments:  Contact information for follow-up providers    Ivin Poot, MD Follow up.   Specialty:  Cardiothoracic Surgery Why:  Your follow-up appointment is on 08/19/2018 at 10:30. Please arrive at 10:00am for a chest xray located at [facility] which is on the first floor of our building.  Contact information: [place] [address] [phone]        Lorretta Harp, MD. Call in 1 day(s).   Specialties:  Cardiology, Radiology Contact information: [address] [place] [address] [phone]            Contact information for after-discharge care    [facility] .   Service:  Skilled Nursing Contact information: [address] [place] [address] [phone]                  Danna Hefty, DO 08/05/2018, 9:09 AM PGY-1, [place]

## 2018-08-06 ENCOUNTER — Non-Acute Institutional Stay (SKILLED_NURSING_FACILITY): Payer: Medicare Other | Admitting: Internal Medicine

## 2018-08-06 ENCOUNTER — Encounter: Payer: Self-pay | Admitting: Internal Medicine

## 2018-08-06 DIAGNOSIS — G8929 Other chronic pain: Secondary | ICD-10-CM | POA: Diagnosis not present

## 2018-08-06 DIAGNOSIS — G4731 Primary central sleep apnea: Secondary | ICD-10-CM | POA: Diagnosis not present

## 2018-08-06 DIAGNOSIS — J948 Other specified pleural conditions: Secondary | ICD-10-CM

## 2018-08-06 DIAGNOSIS — N186 End stage renal disease: Secondary | ICD-10-CM | POA: Diagnosis not present

## 2018-08-06 DIAGNOSIS — Z992 Dependence on renal dialysis: Secondary | ICD-10-CM

## 2018-08-06 NOTE — Patient Instructions (Addendum)
See assessment and plan under each diagnosis in the problem list and acutely for this visit Total time 52  minutes; greater than 50% of the visit spent counseling patient and coordinating care for problems addressed at this encounter

## 2018-08-06 NOTE — Assessment & Plan Note (Signed)
Risk was discussed with the patient.  I recommended were he to take opioids long-term  that this come from 1 physician or a chronic pain clinic.  I shared with him the information gleaned from the [place]

## 2018-08-06 NOTE — Assessment & Plan Note (Signed)
08/06/2018 patient states he has been compliant with his CPAP machine when at home Asked to bring his CPAP from home to the SNF

## 2018-08-06 NOTE — Progress Notes (Signed)
NURSING HOME LOCATION:  [facility] ROOM NUMBER:  [id number]  CODE STATUS:  Full Code  PCP: Wahiba Kartaoui,NP, [place]  This is a comprehensive admission note to [facility] performed on this date less than 30 days from date of admission. Included are preadmission medical/surgical history; reconciled medication list; family history; social history and comprehensive review of systems.  Corrections and additions to the records were documented. Comprehensive physical exam was also performed. Additionally a clinical summary was entered for each active diagnosis pertinent to this admission in the Problem List to enhance continuity of care.  HPI: Patient was hospitalized 1/4-1/15/2020, admitted for right-sided chest pain and shortness of breath.  Imaging revealed a right pleural effusion and loculated hydropneumothorax.  The patient is status post thoracotomy for right-sided empyema and decortication of the collapsed right lower lobe in December 2019 at [facility].  He lives in [place] but visits his girlfriend who lives in [place].  He was hospitalized at that institution 12/5- 07/14/2018.  Dr. Damita Dunnings, CVTS performed the surgery 07/02/2018.  Apparently he received a 10-day course of Zosyn at that time. CVTS was consulted in the ED; it was their opinion that the hydropneumothorax was most likely residual space from a trapped lung of the lower lobe which had failed reexpansion postop at the prior hospital.  CT-guided right pleural pigtail catheter placement with chest tube and suction was performed 07/28/2018.  Fluid culture was negative at 5 days.  With drainage imaging revealed improvement and chest tube was removed.  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  Initially submental oxygen was necessary but O2 sats was stable at the time  of discharge. The CT in the ED suggested a possible rounded density in the right lower lobe with possibility of neoplasm.  Follow-up CT as an outpatient was recommended.  This was deferred to his PCP. Patient has a history of chronic pain and possibly controlled substance abuse.  [facility] reviewed the [facility] Controlled Substance data base indicating that the patient had been seen by 8 different physicians within the last 6 months for opioid prescriptions.  Pain management was closely monitored as an inpatient and he was maintained on his outpatient pain regimen of oxycodone IR 10 mg 4 times daily as needed.  The patient would refuse hemodialysis during the hospitalization unless he received IV Dilaudid.  This was granted "given concern for development of hypervolemia" as per discharge summary.  Opiates were not prescribed by the discharging physician.  Past medical and surgical history: Includes history of small bowel obstruction, pancreatitis, DVT, PTE, nonischemic cardiomyopathy, malignant hypertension, type 2 diabetes, ESRD, complex sleep apnea syndrome, cirrhosis, chronic combined systolic and diastolic congestive heart failure, adjustment disorder with mixed anxiety and depression. Surgeries include gastrostomy, renal transplant, and coronary angiogram.  Social history: Nondrinker; former smoker.  Admits to prior marijuana use.  He denies other illicit drugs.  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  He states that he does read a lot.  Family history: Limited history reviewed   Review of systems: He is alert and oriented and articulate.  He explained that he was seeking medical treatment in [place] as "I do not deal with [facility]".  He denies any hypoglycemia unless he skips meals.  He does not check his sugars.  He states that he  does use his CPAP when he is at home.  He states that CPAP was employed at [employer] but not at [employer].  He has tried to stop the opiates for the  chest pain.  He does have some residual burning pain in the right chest.  He admits to some anxiety.  He denies any other active symptoms.  Constitutional: No fever, significant weight change, fatigue  Eyes: No redness, discharge, pain, vision change ENT/mouth: No nasal congestion, purulent discharge, earache, change in hearing, sore throat  Cardiovascular: No palpitations, paroxysmal nocturnal dyspnea, claudication, edema  Respiratory: No cough, sputum production, hemoptysis, DOE, significant snoring, apnea Gastrointestinal: No heartburn, dysphagia, abdominal pain, nausea /vomiting, rectal bleeding, melena, change in bowels Genitourinary: No dysuria, hematuria, pyuria, incontinence, nocturia Musculoskeletal: No joint stiffness, joint swelling, weakness, pain Dermatologic: No rash, pruritus, change in appearance of skin Neurologic: No dizziness, headache, syncope, seizures, numbness, tingling Psychiatric: No significant depression, insomnia, anorexia Endocrine: No change in hair/skin/nails, excessive thirst, excessive hunger, excessive urination  Hematologic/lymphatic: No significant bruising, lymphadenopathy, abnormal bleeding Allergy/immunology: No itchy/watery eyes, significant sneezing, urticaria, angioedema  Physical exam:  Pertinent or positive findings: He lies in bed at an oblique angle on his left side.  It is not truly left lateral decubitus positioning.  He states that this decreases the discomfort in his right thorax.  He appears somewhat chronically ill and there is suggestion of some limb atrophy.  He resist any palpation of the right upper extremity or right chest, even light palpation.  He has pattern alopecia.  He has a beard and mustache which are closely cropped.  Sclera are muddy.  Breath sounds are decreased on the right.  He has minor rales in the right lower lobe.  Clubbing of the nailbeds is suggested.  He has thickened elongated toenails.  Pedal pulses are surprisingly  strong.  Skin tends to be dry and somewhat cracking w/o frank exfoliation. Strength could be tested due to resistance to position change.  General appearance: no acute distress, increased work of breathing is present.   Lymphatic: No lymphadenopathy about the head, neck, axilla. Eyes: No conjunctival inflammation or lid edema is present. There is no scleral icterus. Ears:  External ear exam shows no significant lesions or deformities.   Nose:  External nasal examination shows no deformity or inflammation. Nasal mucosa are pink and moist without lesions, exudates Oral exam: Lips and gums are healthy appearing.There is no oropharyngeal erythema or exudate. Neck:  No thyromegaly, masses, tenderness noted.    Heart:  Normal rate and regular rhythm. S1 and S2 normal without gallop, murmur, click, rub.  Abdomen: Bowel sounds are normal.  Abdomen is soft and nontender with no organomegaly, hernias, masses. GU: Deferred  Extremities:  No cyanosis, edema. Neurologic exam:  Balance, Rhomberg, finger to nose testing could not be completed due to clinical state Skin: Warm & dry w/o tenting. No significant visable rash.  See clinical summary under each active problem in the Problem List with associated updated therapeutic plan

## 2018-08-06 NOTE — Assessment & Plan Note (Addendum)
Continue dialysis on same 3-day a week schedule IV Dilaudid predialysis would be at the discretion of Dr Marval Regal, Nephrologist

## 2018-08-06 NOTE — Assessment & Plan Note (Addendum)
08/06/2018 patient informed that follow-up CT will be indicated, to be ordered by his PCP at [facility] here in [place] He states any additional cardiovascular surgery will be by Dr. Damita Dunnings in [place]

## 2018-08-07 DIAGNOSIS — Z992 Dependence on renal dialysis: Secondary | ICD-10-CM | POA: Diagnosis not present

## 2018-08-07 DIAGNOSIS — N2581 Secondary hyperparathyroidism of renal origin: Secondary | ICD-10-CM | POA: Diagnosis not present

## 2018-08-07 DIAGNOSIS — G8929 Other chronic pain: Secondary | ICD-10-CM | POA: Diagnosis not present

## 2018-08-07 DIAGNOSIS — M545 Low back pain: Secondary | ICD-10-CM | POA: Diagnosis not present

## 2018-08-07 DIAGNOSIS — N186 End stage renal disease: Secondary | ICD-10-CM | POA: Diagnosis not present

## 2018-08-07 DIAGNOSIS — M5440 Lumbago with sciatica, unspecified side: Secondary | ICD-10-CM | POA: Diagnosis not present

## 2018-08-07 DIAGNOSIS — D509 Iron deficiency anemia, unspecified: Secondary | ICD-10-CM | POA: Diagnosis not present

## 2018-08-07 DIAGNOSIS — D631 Anemia in chronic kidney disease: Secondary | ICD-10-CM | POA: Diagnosis not present

## 2018-08-07 DIAGNOSIS — Z79899 Other long term (current) drug therapy: Secondary | ICD-10-CM | POA: Diagnosis not present

## 2018-08-07 DIAGNOSIS — E875 Hyperkalemia: Secondary | ICD-10-CM | POA: Diagnosis not present

## 2018-08-10 DIAGNOSIS — D631 Anemia in chronic kidney disease: Secondary | ICD-10-CM | POA: Diagnosis not present

## 2018-08-10 DIAGNOSIS — Z992 Dependence on renal dialysis: Secondary | ICD-10-CM | POA: Diagnosis not present

## 2018-08-10 DIAGNOSIS — N2581 Secondary hyperparathyroidism of renal origin: Secondary | ICD-10-CM | POA: Diagnosis not present

## 2018-08-10 DIAGNOSIS — E875 Hyperkalemia: Secondary | ICD-10-CM | POA: Diagnosis not present

## 2018-08-10 DIAGNOSIS — D509 Iron deficiency anemia, unspecified: Secondary | ICD-10-CM | POA: Diagnosis not present

## 2018-08-10 DIAGNOSIS — N186 End stage renal disease: Secondary | ICD-10-CM | POA: Diagnosis not present

## 2018-08-12 ENCOUNTER — Non-Acute Institutional Stay (SKILLED_NURSING_FACILITY): Payer: Medicare Other | Admitting: Adult Health

## 2018-08-12 ENCOUNTER — Encounter: Payer: Self-pay | Admitting: Adult Health

## 2018-08-12 DIAGNOSIS — J948 Other specified pleural conditions: Secondary | ICD-10-CM

## 2018-08-12 DIAGNOSIS — E1122 Type 2 diabetes mellitus with diabetic chronic kidney disease: Secondary | ICD-10-CM | POA: Diagnosis not present

## 2018-08-12 DIAGNOSIS — R0789 Other chest pain: Secondary | ICD-10-CM

## 2018-08-12 DIAGNOSIS — Z992 Dependence on renal dialysis: Secondary | ICD-10-CM | POA: Diagnosis not present

## 2018-08-12 DIAGNOSIS — D509 Iron deficiency anemia, unspecified: Secondary | ICD-10-CM | POA: Diagnosis not present

## 2018-08-12 DIAGNOSIS — Z86718 Personal history of other venous thrombosis and embolism: Secondary | ICD-10-CM | POA: Diagnosis not present

## 2018-08-12 DIAGNOSIS — F419 Anxiety disorder, unspecified: Secondary | ICD-10-CM

## 2018-08-12 DIAGNOSIS — N186 End stage renal disease: Secondary | ICD-10-CM

## 2018-08-12 DIAGNOSIS — K861 Other chronic pancreatitis: Secondary | ICD-10-CM | POA: Diagnosis not present

## 2018-08-12 DIAGNOSIS — E875 Hyperkalemia: Secondary | ICD-10-CM | POA: Diagnosis not present

## 2018-08-12 DIAGNOSIS — G4709 Other insomnia: Secondary | ICD-10-CM | POA: Diagnosis not present

## 2018-08-12 DIAGNOSIS — D631 Anemia in chronic kidney disease: Secondary | ICD-10-CM | POA: Diagnosis not present

## 2018-08-12 DIAGNOSIS — M19012 Primary osteoarthritis, left shoulder: Secondary | ICD-10-CM

## 2018-08-12 DIAGNOSIS — N2581 Secondary hyperparathyroidism of renal origin: Secondary | ICD-10-CM | POA: Diagnosis not present

## 2018-08-12 DIAGNOSIS — I10 Essential (primary) hypertension: Secondary | ICD-10-CM

## 2018-08-12 MED ORDER — AMLODIPINE BESYLATE 5 MG PO TABS: 5.0000 mg | ORAL_TABLET | Freq: Every day | ORAL | 0 refills | Status: DC

## 2018-08-12 MED ORDER — DICYCLOMINE HCL 10 MG/5ML PO SOLN: 10.0000 mg | Freq: Four times a day (QID) | ORAL | 0 refills | Status: DC | PRN

## 2018-08-12 MED ORDER — HYDRALAZINE HCL 100 MG PO TABS: 100.0000 mg | ORAL_TABLET | Freq: Three times a day (TID) | ORAL | 0 refills | Status: DC

## 2018-08-12 MED ORDER — ZOLPIDEM TARTRATE 5 MG PO TABS: 5.0000 mg | ORAL_TABLET | Freq: Every day | ORAL | 0 refills | Status: DC

## 2018-08-12 MED ORDER — CINACALCET HCL 90 MG PO TABS: 90.0000 mg | ORAL_TABLET | Freq: Every evening | ORAL | 0 refills | Status: DC

## 2018-08-12 MED ORDER — BUSPIRONE HCL 5 MG PO TABS: 5.0000 mg | ORAL_TABLET | Freq: Two times a day (BID) | ORAL | 0 refills | Status: DC

## 2018-08-12 MED ORDER — NITROGLYCERIN 0.4 MG SL SUBL: 0.4000 mg | SUBLINGUAL_TABLET | SUBLINGUAL | 0 refills | Status: AC | PRN

## 2018-08-12 MED ORDER — PANCRELIPASE (LIP-PROT-AMYL) 12000-38000 UNITS PO CPEP: 12000.0000 [IU] | ORAL_CAPSULE | Freq: Three times a day (TID) | ORAL | 0 refills | Status: DC

## 2018-08-12 MED ORDER — DICLOFENAC SODIUM 1 % TD GEL: 4.0000 g | Freq: Four times a day (QID) | TRANSDERMAL | 0 refills | Status: DC

## 2018-08-12 MED ORDER — APIXABAN 5 MG PO TABS: 5.0000 mg | ORAL_TABLET | Freq: Two times a day (BID) | ORAL | 0 refills | Status: DC

## 2018-08-12 MED ORDER — LANTHANUM CARBONATE 1000 MG PO CHEW: 1000.0000 mg | CHEWABLE_TABLET | Freq: Three times a day (TID) | ORAL | 0 refills | Status: DC

## 2018-08-12 MED ORDER — ONDANSETRON 4 MG PO TBDP: 4.0000 mg | ORAL_TABLET | Freq: Three times a day (TID) | ORAL | 0 refills | Status: DC | PRN

## 2018-08-12 NOTE — Progress Notes (Signed)
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 the complex fluid collection of the lower right chest. Modified Seldinger technique was used to place a 10 [place] drain into the pleural space. Catheter was attached to water seal aspiration chamber and sutured in position. Final images were stored and sent to PACs. Catheter was sutured in position. Patient tolerated procedure well and remained hemodynamically  stable throughout. No complications were encountered and no significant blood loss. FINDINGS: CT images of the chest demonstrate pleural thickening on the right with complex fluid collection and multiple loculations. Ten [place] drain terminates within the costophrenic sulcus within the fluid collection. IMPRESSION: Status post CT-guided right thoracostomy tube placement. Signed, Dulcy Fanny. Dellia Nims, RPVI [facility] Radiology Electronically Signed   By: Corrie Mckusick D.O.   On: 07/28/2018 16:25    Assessment/Plan  1. Hydropneumothorax - Cardiac and thoracic surgery was consulted and felt hydropneumothorax was most likely residual space from entrapped lung of lower lobe that failed to reexpand from previous admission.   He had CT-guided right pleural pigtail catheter placed with chest tube suction on 1/7.  Fluid was cultured and was negative x5 days.  Upon adequate drainage and improvement on chest x-ray, chest tube was removed.  Repeat chest x-ray was stable without evidence of infection.  No antibiotics were initiated since white count where normal and was afebrile throughout the entire hospitalization.  2. Essential hypertension - amLODipine (NORVASC) 5 MG tablet; Take 1 tablet (5 mg total) by mouth daily.  Dispense: 30 tablet; Refill: 0 - hydrALAZINE (APRESOLINE) 100 MG tablet; Take 1 tablet (100 mg total) by mouth 3 (three) times daily.  Dispense: 90 tablet; Refill: 0  3. History of DVT (deep vein thrombosis) - apixaban (ELIQUIS) 5 MG TABS tablet; Take 1 tablet (5 mg total) by mouth 2 (two) times daily.  Dispense: 60 tablet; Refill: 0  4. End-stage renal disease on hemodialysis (HCC) - cinacalcet (SENSIPAR) 90 MG tablet; Take 1 tablet (90 mg total) by mouth every evening.  Dispense: 30 tablet; Refill: 0 - lanthanum (FOSRENOL) 1000 MG chewable tablet; Chew 1 tablet (1,000 mg total) by mouth 3 (three) times daily with meals.  Dispense: 90 tablet; Refill: 0 - hemodialysis 3X/week  5. Primary osteoarthritis of left shoulder - diclofenac sodium (VOLTAREN) 1 % GEL; Apply 4 g topically 4 (four) times daily. Apply 4 gram transdermaly four times daily for left shoulder pain  Dispense: 100 g; Refill: 0  6. Other chronic pancreatitis (HCC) - dicyclomine (BENTYL) 10 MG/5ML syrup; Take 5 mLs (10 mg total) by mouth 4 (four) times daily as needed.  Dispense: 473 mL; Refill: 0  7. Other insomnia - zolpidem (AMBIEN) 5 MG tablet; Take 1 tablet (5 mg total) by mouth at bedtime.  Dispense: 30 tablet; Refill: 0  8. Other chest pain - nitroGLYCERIN (NITROSTAT) 0.4 MG SL tablet; Place 1 tablet (0.4 mg total) under the tongue every 5 (five) minutes as needed for chest pain.  Dispense: 20 tablet; Refill: 0  9. Anxiety -  busPIRone (BUSPAR) 5 MG tablet; Take 1 tablet (5 mg total) by mouth 2 (two) times daily.  Dispense: 60 tablet; Refill: 0    I have filled out patient's discharge paperwork and written prescriptions.  Patient will receive home health PT, OT, Social Worker and Nursing.  DME provided:  [place] and O2 @ 2L/min via [place], portable and concentrator  Total discharge time: Greater than 30 minutes Greater than 50% was spent in counseling and coordination of care.   Discharge time involved coordination of the discharge process with social worker, nursing staff and therapy department. Medical justification for home health services/DME verified.   Durenda Age, NP [facility] and Adult Medicine [phone] (Monday-Friday 8:00 a.m. - 5:00 p.m.) [phone] (after hours)

## 2018-08-14 DIAGNOSIS — N186 End stage renal disease: Secondary | ICD-10-CM | POA: Diagnosis not present

## 2018-08-14 DIAGNOSIS — Z992 Dependence on renal dialysis: Secondary | ICD-10-CM | POA: Diagnosis not present

## 2018-08-14 DIAGNOSIS — D631 Anemia in chronic kidney disease: Secondary | ICD-10-CM | POA: Diagnosis not present

## 2018-08-14 DIAGNOSIS — D509 Iron deficiency anemia, unspecified: Secondary | ICD-10-CM | POA: Diagnosis not present

## 2018-08-14 DIAGNOSIS — N2581 Secondary hyperparathyroidism of renal origin: Secondary | ICD-10-CM | POA: Diagnosis not present

## 2018-08-14 DIAGNOSIS — E875 Hyperkalemia: Secondary | ICD-10-CM | POA: Diagnosis not present

## 2018-08-17 ENCOUNTER — Other Ambulatory Visit: Payer: Self-pay | Admitting: Cardiothoracic Surgery

## 2018-08-17 DIAGNOSIS — J90 Pleural effusion, not elsewhere classified: Secondary | ICD-10-CM | POA: Diagnosis not present

## 2018-08-17 DIAGNOSIS — I110 Hypertensive heart disease with heart failure: Secondary | ICD-10-CM | POA: Diagnosis not present

## 2018-08-17 DIAGNOSIS — R0600 Dyspnea, unspecified: Secondary | ICD-10-CM | POA: Diagnosis not present

## 2018-08-17 DIAGNOSIS — R091 Pleurisy: Secondary | ICD-10-CM | POA: Diagnosis not present

## 2018-08-17 DIAGNOSIS — J939 Pneumothorax, unspecified: Secondary | ICD-10-CM | POA: Diagnosis not present

## 2018-08-17 DIAGNOSIS — J869 Pyothorax without fistula: Secondary | ICD-10-CM

## 2018-08-17 DIAGNOSIS — Z79899 Other long term (current) drug therapy: Secondary | ICD-10-CM | POA: Diagnosis not present

## 2018-08-17 DIAGNOSIS — Z902 Acquired absence of lung [part of]: Secondary | ICD-10-CM | POA: Diagnosis not present

## 2018-08-17 DIAGNOSIS — I120 Hypertensive chronic kidney disease with stage 5 chronic kidney disease or end stage renal disease: Secondary | ICD-10-CM | POA: Diagnosis not present

## 2018-08-17 DIAGNOSIS — Z888 Allergy status to other drugs, medicaments and biological substances status: Secondary | ICD-10-CM | POA: Diagnosis not present

## 2018-08-17 DIAGNOSIS — N281 Cyst of kidney, acquired: Secondary | ICD-10-CM | POA: Diagnosis not present

## 2018-08-17 DIAGNOSIS — E1122 Type 2 diabetes mellitus with diabetic chronic kidney disease: Secondary | ICD-10-CM | POA: Diagnosis not present

## 2018-08-17 DIAGNOSIS — R109 Unspecified abdominal pain: Secondary | ICD-10-CM | POA: Diagnosis not present

## 2018-08-17 DIAGNOSIS — Z86718 Personal history of other venous thrombosis and embolism: Secondary | ICD-10-CM | POA: Diagnosis not present

## 2018-08-17 DIAGNOSIS — Z794 Long term (current) use of insulin: Secondary | ICD-10-CM | POA: Diagnosis not present

## 2018-08-17 DIAGNOSIS — N186 End stage renal disease: Secondary | ICD-10-CM | POA: Diagnosis not present

## 2018-08-17 DIAGNOSIS — Z992 Dependence on renal dialysis: Secondary | ICD-10-CM | POA: Diagnosis not present

## 2018-08-17 DIAGNOSIS — R079 Chest pain, unspecified: Secondary | ICD-10-CM | POA: Diagnosis not present

## 2018-08-17 DIAGNOSIS — I5042 Chronic combined systolic (congestive) and diastolic (congestive) heart failure: Secondary | ICD-10-CM | POA: Diagnosis not present

## 2018-08-17 DIAGNOSIS — G4733 Obstructive sleep apnea (adult) (pediatric): Secondary | ICD-10-CM | POA: Diagnosis not present

## 2018-08-17 DIAGNOSIS — Z9989 Dependence on other enabling machines and devices: Secondary | ICD-10-CM | POA: Diagnosis not present

## 2018-08-17 DIAGNOSIS — J942 Hemothorax: Secondary | ICD-10-CM | POA: Diagnosis not present

## 2018-08-17 DIAGNOSIS — J948 Other specified pleural conditions: Secondary | ICD-10-CM | POA: Diagnosis not present

## 2018-08-18 ENCOUNTER — Ambulatory Visit: Payer: Medicare Other | Admitting: Cardiothoracic Surgery

## 2018-08-18 DIAGNOSIS — N281 Cyst of kidney, acquired: Secondary | ICD-10-CM | POA: Diagnosis not present

## 2018-08-18 DIAGNOSIS — R079 Chest pain, unspecified: Secondary | ICD-10-CM | POA: Diagnosis not present

## 2018-08-18 DIAGNOSIS — R109 Unspecified abdominal pain: Secondary | ICD-10-CM | POA: Diagnosis not present

## 2018-08-18 DIAGNOSIS — J90 Pleural effusion, not elsewhere classified: Secondary | ICD-10-CM | POA: Diagnosis not present

## 2018-08-18 DIAGNOSIS — R0600 Dyspnea, unspecified: Secondary | ICD-10-CM | POA: Diagnosis not present

## 2018-08-18 DIAGNOSIS — J939 Pneumothorax, unspecified: Secondary | ICD-10-CM | POA: Diagnosis not present

## 2018-08-19 ENCOUNTER — Ambulatory Visit: Payer: Medicare Other | Admitting: Cardiothoracic Surgery

## 2018-08-19 ENCOUNTER — Encounter: Payer: Self-pay | Admitting: Cardiothoracic Surgery

## 2018-08-19 DIAGNOSIS — E875 Hyperkalemia: Secondary | ICD-10-CM | POA: Diagnosis not present

## 2018-08-19 DIAGNOSIS — E1122 Type 2 diabetes mellitus with diabetic chronic kidney disease: Secondary | ICD-10-CM | POA: Diagnosis not present

## 2018-08-19 DIAGNOSIS — N186 End stage renal disease: Secondary | ICD-10-CM | POA: Diagnosis not present

## 2018-08-19 DIAGNOSIS — I132 Hypertensive heart and chronic kidney disease with heart failure and with stage 5 chronic kidney disease, or end stage renal disease: Secondary | ICD-10-CM | POA: Diagnosis not present

## 2018-08-19 DIAGNOSIS — Z794 Long term (current) use of insulin: Secondary | ICD-10-CM | POA: Diagnosis not present

## 2018-08-19 DIAGNOSIS — Z888 Allergy status to other drugs, medicaments and biological substances status: Secondary | ICD-10-CM | POA: Diagnosis not present

## 2018-08-19 DIAGNOSIS — N2581 Secondary hyperparathyroidism of renal origin: Secondary | ICD-10-CM | POA: Diagnosis not present

## 2018-08-19 DIAGNOSIS — Z886 Allergy status to analgesic agent status: Secondary | ICD-10-CM | POA: Diagnosis not present

## 2018-08-19 DIAGNOSIS — Z86718 Personal history of other venous thrombosis and embolism: Secondary | ICD-10-CM | POA: Diagnosis not present

## 2018-08-19 DIAGNOSIS — Z79899 Other long term (current) drug therapy: Secondary | ICD-10-CM | POA: Diagnosis not present

## 2018-08-19 DIAGNOSIS — Z87892 Personal history of anaphylaxis: Secondary | ICD-10-CM | POA: Diagnosis not present

## 2018-08-19 DIAGNOSIS — G4733 Obstructive sleep apnea (adult) (pediatric): Secondary | ICD-10-CM | POA: Diagnosis not present

## 2018-08-19 DIAGNOSIS — Z992 Dependence on renal dialysis: Secondary | ICD-10-CM | POA: Diagnosis not present

## 2018-08-19 DIAGNOSIS — R112 Nausea with vomiting, unspecified: Secondary | ICD-10-CM | POA: Diagnosis not present

## 2018-08-19 DIAGNOSIS — I5082 Biventricular heart failure: Secondary | ICD-10-CM | POA: Diagnosis not present

## 2018-08-19 DIAGNOSIS — D509 Iron deficiency anemia, unspecified: Secondary | ICD-10-CM | POA: Diagnosis not present

## 2018-08-19 DIAGNOSIS — Z885 Allergy status to narcotic agent status: Secondary | ICD-10-CM | POA: Diagnosis not present

## 2018-08-19 DIAGNOSIS — D631 Anemia in chronic kidney disease: Secondary | ICD-10-CM | POA: Diagnosis not present

## 2018-08-19 DIAGNOSIS — I120 Hypertensive chronic kidney disease with stage 5 chronic kidney disease or end stage renal disease: Secondary | ICD-10-CM | POA: Diagnosis not present

## 2018-08-20 IMAGING — CR DG CHEST 1V PORT
1 series · 1 of 1 positions shown · non-contrast
Comparison: 03/11/2016

CLINICAL DATA: Chest pain radiating to the back.  Bradycardia.

EXAM:
PORTABLE CHEST 1 VIEW

[AP]
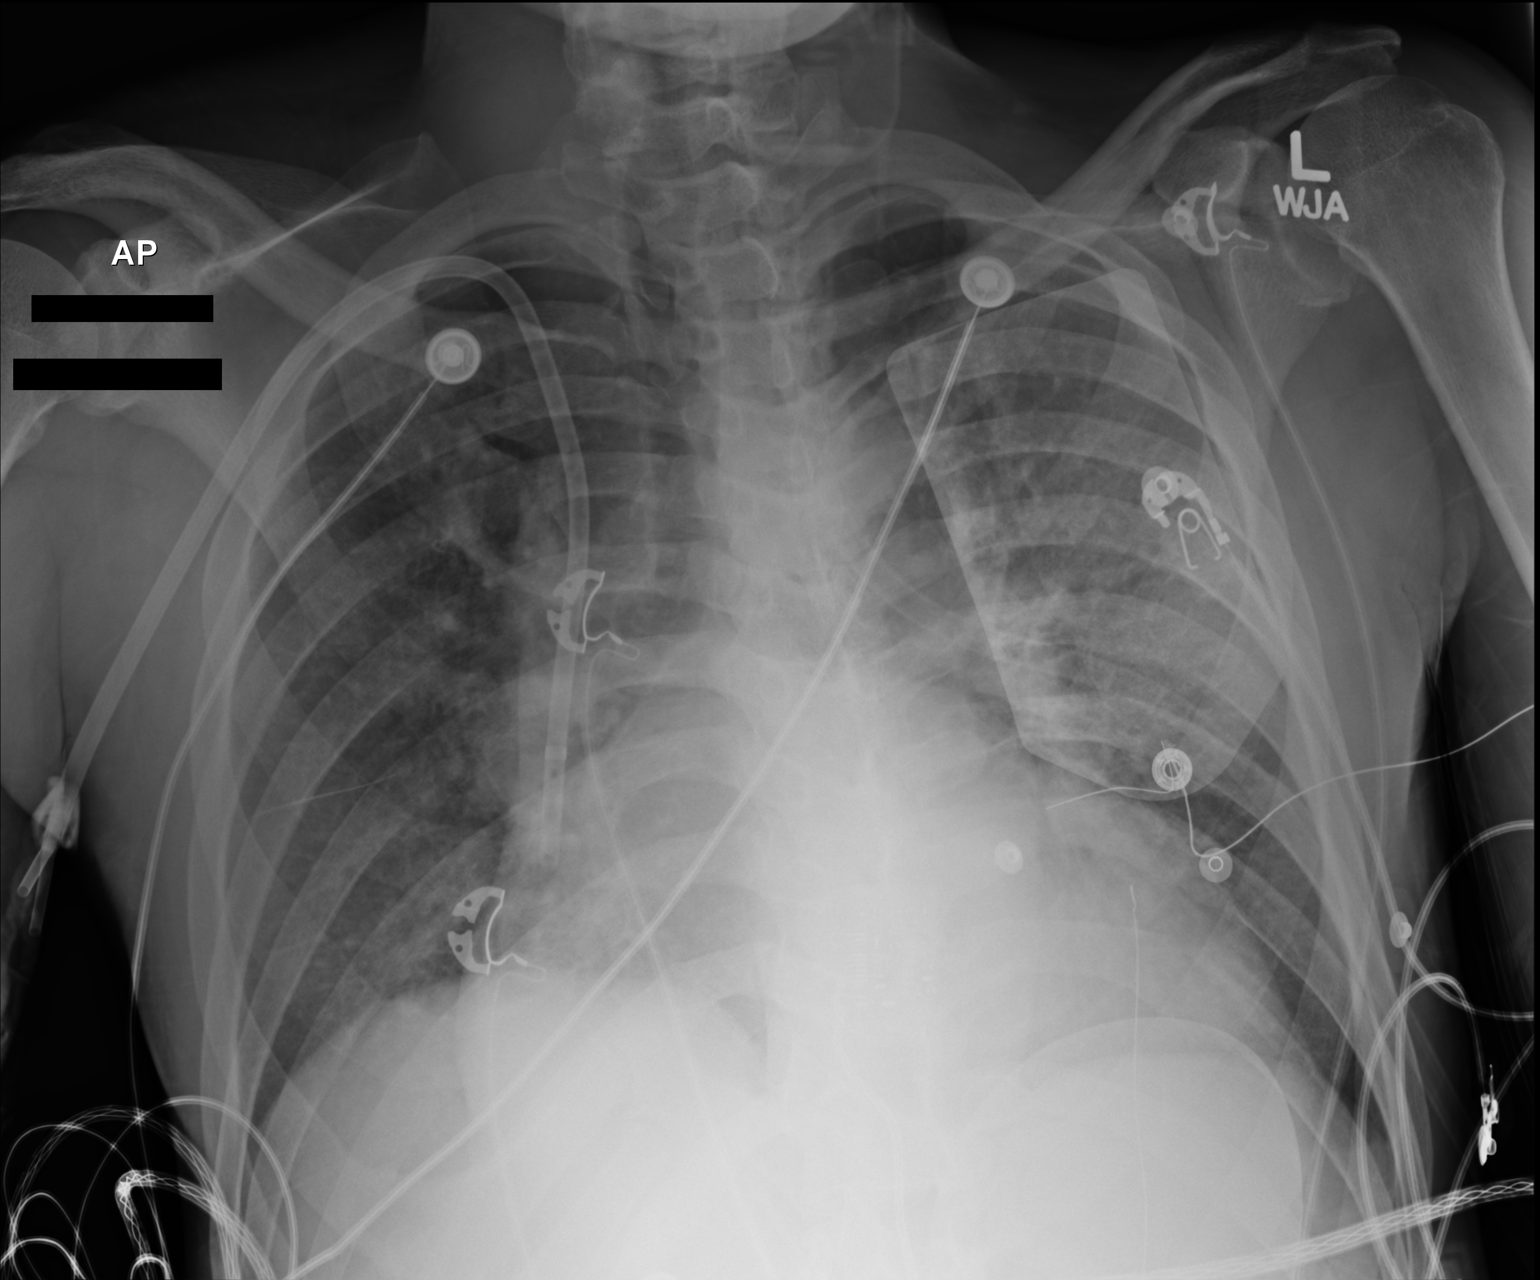

[1 of 1 positions shown; findings below may reference images not displayed]

FINDINGS: Right jugular central line with tip at the cavoatrial junction.

Unchanged moderate cardiomegaly.

No dense consolidation.  No large effusion.
IMPRESSION: Limited study. No dense consolidation or large effusion. Unchanged
cardiomegaly.

## 2018-08-22 DIAGNOSIS — Z992 Dependence on renal dialysis: Secondary | ICD-10-CM | POA: Diagnosis not present

## 2018-08-22 DIAGNOSIS — T8610 Unspecified complication of kidney transplant: Secondary | ICD-10-CM | POA: Diagnosis not present

## 2018-08-22 DIAGNOSIS — E875 Hyperkalemia: Secondary | ICD-10-CM | POA: Diagnosis not present

## 2018-08-22 DIAGNOSIS — R2241 Localized swelling, mass and lump, right lower limb: Secondary | ICD-10-CM | POA: Diagnosis not present

## 2018-08-22 DIAGNOSIS — E1122 Type 2 diabetes mellitus with diabetic chronic kidney disease: Secondary | ICD-10-CM | POA: Diagnosis not present

## 2018-08-22 DIAGNOSIS — N2581 Secondary hyperparathyroidism of renal origin: Secondary | ICD-10-CM | POA: Diagnosis not present

## 2018-08-22 DIAGNOSIS — N186 End stage renal disease: Secondary | ICD-10-CM | POA: Diagnosis not present

## 2018-08-22 DIAGNOSIS — D509 Iron deficiency anemia, unspecified: Secondary | ICD-10-CM | POA: Diagnosis not present

## 2018-08-22 DIAGNOSIS — D631 Anemia in chronic kidney disease: Secondary | ICD-10-CM | POA: Diagnosis not present

## 2018-08-24 DIAGNOSIS — Z794 Long term (current) use of insulin: Secondary | ICD-10-CM | POA: Diagnosis not present

## 2018-08-24 DIAGNOSIS — J942 Hemothorax: Secondary | ICD-10-CM | POA: Diagnosis not present

## 2018-08-24 DIAGNOSIS — Z9981 Dependence on supplemental oxygen: Secondary | ICD-10-CM | POA: Diagnosis not present

## 2018-08-24 DIAGNOSIS — J860 Pyothorax with fistula: Secondary | ICD-10-CM | POA: Diagnosis not present

## 2018-08-24 DIAGNOSIS — G4733 Obstructive sleep apnea (adult) (pediatric): Secondary | ICD-10-CM | POA: Diagnosis not present

## 2018-08-24 DIAGNOSIS — N289 Disorder of kidney and ureter, unspecified: Secondary | ICD-10-CM | POA: Diagnosis not present

## 2018-08-24 DIAGNOSIS — K863 Pseudocyst of pancreas: Secondary | ICD-10-CM | POA: Diagnosis not present

## 2018-08-24 DIAGNOSIS — R160 Hepatomegaly, not elsewhere classified: Secondary | ICD-10-CM | POA: Diagnosis not present

## 2018-08-24 DIAGNOSIS — I120 Hypertensive chronic kidney disease with stage 5 chronic kidney disease or end stage renal disease: Secondary | ICD-10-CM | POA: Diagnosis not present

## 2018-08-24 DIAGNOSIS — Z881 Allergy status to other antibiotic agents status: Secondary | ICD-10-CM | POA: Diagnosis not present

## 2018-08-24 DIAGNOSIS — Z888 Allergy status to other drugs, medicaments and biological substances status: Secondary | ICD-10-CM | POA: Diagnosis not present

## 2018-08-24 DIAGNOSIS — I110 Hypertensive heart disease with heart failure: Secondary | ICD-10-CM | POA: Diagnosis not present

## 2018-08-24 DIAGNOSIS — N2889 Other specified disorders of kidney and ureter: Secondary | ICD-10-CM | POA: Diagnosis not present

## 2018-08-24 DIAGNOSIS — J948 Other specified pleural conditions: Secondary | ICD-10-CM | POA: Diagnosis not present

## 2018-08-24 DIAGNOSIS — Z9989 Dependence on other enabling machines and devices: Secondary | ICD-10-CM | POA: Diagnosis not present

## 2018-08-24 DIAGNOSIS — I313 Pericardial effusion (noninflammatory): Secondary | ICD-10-CM | POA: Diagnosis not present

## 2018-08-24 DIAGNOSIS — Z79899 Other long term (current) drug therapy: Secondary | ICD-10-CM | POA: Diagnosis not present

## 2018-08-24 DIAGNOSIS — Z86718 Personal history of other venous thrombosis and embolism: Secondary | ICD-10-CM | POA: Diagnosis not present

## 2018-08-24 DIAGNOSIS — E1122 Type 2 diabetes mellitus with diabetic chronic kidney disease: Secondary | ICD-10-CM | POA: Diagnosis not present

## 2018-08-24 DIAGNOSIS — Z886 Allergy status to analgesic agent status: Secondary | ICD-10-CM | POA: Diagnosis not present

## 2018-08-24 DIAGNOSIS — Z992 Dependence on renal dialysis: Secondary | ICD-10-CM | POA: Diagnosis not present

## 2018-08-24 DIAGNOSIS — I5042 Chronic combined systolic (congestive) and diastolic (congestive) heart failure: Secondary | ICD-10-CM | POA: Diagnosis not present

## 2018-08-24 DIAGNOSIS — N186 End stage renal disease: Secondary | ICD-10-CM | POA: Diagnosis not present

## 2018-08-24 DIAGNOSIS — Z940 Kidney transplant status: Secondary | ICD-10-CM | POA: Diagnosis not present

## 2018-08-25 DIAGNOSIS — J948 Other specified pleural conditions: Secondary | ICD-10-CM | POA: Diagnosis not present

## 2018-08-25 DIAGNOSIS — N2889 Other specified disorders of kidney and ureter: Secondary | ICD-10-CM | POA: Diagnosis not present

## 2018-08-25 DIAGNOSIS — I313 Pericardial effusion (noninflammatory): Secondary | ICD-10-CM | POA: Diagnosis not present

## 2018-08-25 DIAGNOSIS — R160 Hepatomegaly, not elsewhere classified: Secondary | ICD-10-CM | POA: Diagnosis not present

## 2018-08-26 DIAGNOSIS — N186 End stage renal disease: Secondary | ICD-10-CM | POA: Diagnosis not present

## 2018-08-26 DIAGNOSIS — E875 Hyperkalemia: Secondary | ICD-10-CM | POA: Diagnosis not present

## 2018-08-26 DIAGNOSIS — Z992 Dependence on renal dialysis: Secondary | ICD-10-CM | POA: Diagnosis not present

## 2018-08-26 DIAGNOSIS — N2581 Secondary hyperparathyroidism of renal origin: Secondary | ICD-10-CM | POA: Diagnosis not present

## 2018-08-26 DIAGNOSIS — D509 Iron deficiency anemia, unspecified: Secondary | ICD-10-CM | POA: Diagnosis not present

## 2018-08-26 DIAGNOSIS — R2241 Localized swelling, mass and lump, right lower limb: Secondary | ICD-10-CM | POA: Diagnosis not present

## 2018-08-28 ENCOUNTER — Encounter (HOSPITAL_COMMUNITY): Payer: Self-pay | Admitting: Emergency Medicine

## 2018-08-28 ENCOUNTER — Emergency Department (HOSPITAL_COMMUNITY): Payer: Medicare Other

## 2018-08-28 ENCOUNTER — Emergency Department (HOSPITAL_COMMUNITY)
Admission: EM | Admit: 2018-08-28 | Discharge: 2018-08-28 | Disposition: A | Discharge status: Home / Self Care | Payer: Medicare Other | Attending: Emergency Medicine | Admitting: Emergency Medicine

## 2018-08-28 DIAGNOSIS — N186 End stage renal disease: Secondary | ICD-10-CM | POA: Insufficient documentation

## 2018-08-28 DIAGNOSIS — I4581 Long QT syndrome: Secondary | ICD-10-CM | POA: Diagnosis not present

## 2018-08-28 DIAGNOSIS — Z9114 Patient's other noncompliance with medication regimen: Secondary | ICD-10-CM | POA: Insufficient documentation

## 2018-08-28 DIAGNOSIS — Z87891 Personal history of nicotine dependence: Secondary | ICD-10-CM | POA: Insufficient documentation

## 2018-08-28 DIAGNOSIS — I132 Hypertensive heart and chronic kidney disease with heart failure and with stage 5 chronic kidney disease, or end stage renal disease: Secondary | ICD-10-CM | POA: Diagnosis not present

## 2018-08-28 DIAGNOSIS — Z940 Kidney transplant status: Secondary | ICD-10-CM | POA: Insufficient documentation

## 2018-08-28 DIAGNOSIS — E1122 Type 2 diabetes mellitus with diabetic chronic kidney disease: Secondary | ICD-10-CM | POA: Diagnosis not present

## 2018-08-28 DIAGNOSIS — I5042 Chronic combined systolic (congestive) and diastolic (congestive) heart failure: Secondary | ICD-10-CM | POA: Insufficient documentation

## 2018-08-28 DIAGNOSIS — Z992 Dependence on renal dialysis: Secondary | ICD-10-CM | POA: Insufficient documentation

## 2018-08-28 DIAGNOSIS — R0789 Other chest pain: Secondary | ICD-10-CM | POA: Diagnosis not present

## 2018-08-28 DIAGNOSIS — Z7901 Long term (current) use of anticoagulants: Secondary | ICD-10-CM | POA: Insufficient documentation

## 2018-08-28 DIAGNOSIS — Z79899 Other long term (current) drug therapy: Secondary | ICD-10-CM | POA: Insufficient documentation

## 2018-08-28 DIAGNOSIS — J948 Other specified pleural conditions: Secondary | ICD-10-CM | POA: Diagnosis not present

## 2018-08-28 LAB — CBC WITH DIFFERENTIAL/PLATELET
Abs Immature Granulocytes: 0.01 10*3/uL (ref 0.00–0.07)
Basophils Absolute: 0 10*3/uL (ref 0.0–0.1)
Basophils Relative: 1 %
Eosinophils Absolute: 0.5 10*3/uL (ref 0.0–0.5)
Eosinophils Relative: 12 %
HCT: 28.2 % — ABNORMAL LOW (ref 39.0–52.0)
Hemoglobin: 8.4 g/dL — ABNORMAL LOW (ref 13.0–17.0)
IMMATURE GRANULOCYTES: 0 %
Lymphocytes Relative: 15 %
Lymphs Abs: 0.6 10*3/uL — ABNORMAL LOW (ref 0.7–4.0)
MCH: 24.6 pg — ABNORMAL LOW (ref 26.0–34.0)
MCHC: 29.8 g/dL — ABNORMAL LOW (ref 30.0–36.0)
MCV: 82.5 fL (ref 80.0–100.0)
MONOS PCT: 16 %
Monocytes Absolute: 0.6 10*3/uL (ref 0.1–1.0)
NEUTROS PCT: 56 %
Neutro Abs: 2.3 10*3/uL (ref 1.7–7.7)
Platelets: 194 10*3/uL (ref 150–400)
RBC: 3.42 MIL/uL — ABNORMAL LOW (ref 4.22–5.81)
RDW: 20.9 % — ABNORMAL HIGH (ref 11.5–15.5)
WBC: 4 10*3/uL (ref 4.0–10.5)
nRBC: 0 % (ref 0.0–0.2)

## 2018-08-28 LAB — BASIC METABOLIC PANEL
Anion gap: 17 — ABNORMAL HIGH (ref 5–15)
BUN: 48 mg/dL — ABNORMAL HIGH (ref 6–20)
CALCIUM: 9.3 mg/dL (ref 8.9–10.3)
CO2: 26 mmol/L (ref 22–32)
Chloride: 97 mmol/L — ABNORMAL LOW (ref 98–111)
Creatinine, Ser: 9.41 mg/dL — ABNORMAL HIGH (ref 0.61–1.24)
GFR calc Af Amer: 7 mL/min — ABNORMAL LOW (ref >60)
GFR calc non Af Amer: 6 mL/min — ABNORMAL LOW (ref >60)
Glucose, Bld: 70 mg/dL (ref 70–99)
Potassium: 4.1 mmol/L (ref 3.5–5.1)
Sodium: 140 mmol/L (ref 135–145)

## 2018-08-28 LAB — I-STAT TROPONIN, ED: Troponin i, poc: 0.03 ng/mL (ref 0.00–0.08)

## 2018-08-28 MED ORDER — HYDROMORPHONE HCL 1 MG/ML IJ SOLN
2.0000 mg | Freq: Once | INTRAMUSCULAR | Status: AC
  Administered 2018-08-28: 2 mg via INTRAMUSCULAR
  Filled 2018-08-28: qty 2

## 2018-08-28 MED ORDER — PROMETHAZINE HCL 25 MG/ML IJ SOLN
25.0000 mg | Freq: Once | INTRAMUSCULAR | Status: AC
  Administered 2018-08-28: 25 mg via INTRAMUSCULAR
  Filled 2018-08-28: qty 1

## 2018-08-28 NOTE — ED Triage Notes (Signed)
  Patient comes in with chest pain that has been going on for a few weeks.  Patient states he had a chest tube placed a week ago and it was removed this week.  Patient states it feels like it did before he had the tube placed.  Pain 9/10. Patient states he missed his dialysis on Wednesday.
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
## 2018-08-28 NOTE — ED Notes (Signed)
Patient verbalizes understanding of discharge instructions. Opportunity for questioning and answers were provided. Armband removed by staff, pt discharged from ED.

## 2018-08-28 NOTE — Discharge Instructions (Addendum)
Follow-up with your primary doctor as scheduled.

## 2018-08-29 DIAGNOSIS — N186 End stage renal disease: Secondary | ICD-10-CM | POA: Diagnosis not present

## 2018-08-29 DIAGNOSIS — R2241 Localized swelling, mass and lump, right lower limb: Secondary | ICD-10-CM | POA: Diagnosis not present

## 2018-08-29 DIAGNOSIS — D509 Iron deficiency anemia, unspecified: Secondary | ICD-10-CM | POA: Diagnosis not present

## 2018-08-29 DIAGNOSIS — N2581 Secondary hyperparathyroidism of renal origin: Secondary | ICD-10-CM | POA: Diagnosis not present

## 2018-08-29 DIAGNOSIS — Z992 Dependence on renal dialysis: Secondary | ICD-10-CM | POA: Diagnosis not present

## 2018-08-29 DIAGNOSIS — E875 Hyperkalemia: Secondary | ICD-10-CM | POA: Diagnosis not present

## 2018-08-31 DIAGNOSIS — R2241 Localized swelling, mass and lump, right lower limb: Secondary | ICD-10-CM | POA: Diagnosis not present

## 2018-08-31 DIAGNOSIS — J939 Pneumothorax, unspecified: Secondary | ICD-10-CM | POA: Diagnosis not present

## 2018-08-31 DIAGNOSIS — Z79899 Other long term (current) drug therapy: Secondary | ICD-10-CM | POA: Diagnosis not present

## 2018-08-31 DIAGNOSIS — N186 End stage renal disease: Secondary | ICD-10-CM | POA: Diagnosis not present

## 2018-08-31 DIAGNOSIS — I132 Hypertensive heart and chronic kidney disease with heart failure and with stage 5 chronic kidney disease, or end stage renal disease: Secondary | ICD-10-CM | POA: Diagnosis not present

## 2018-08-31 DIAGNOSIS — R0602 Shortness of breath: Secondary | ICD-10-CM | POA: Diagnosis not present

## 2018-08-31 DIAGNOSIS — E875 Hyperkalemia: Secondary | ICD-10-CM | POA: Diagnosis not present

## 2018-08-31 DIAGNOSIS — Z992 Dependence on renal dialysis: Secondary | ICD-10-CM | POA: Diagnosis not present

## 2018-08-31 DIAGNOSIS — I5042 Chronic combined systolic (congestive) and diastolic (congestive) heart failure: Secondary | ICD-10-CM | POA: Diagnosis not present

## 2018-08-31 DIAGNOSIS — D509 Iron deficiency anemia, unspecified: Secondary | ICD-10-CM | POA: Diagnosis not present

## 2018-08-31 DIAGNOSIS — Z886 Allergy status to analgesic agent status: Secondary | ICD-10-CM | POA: Diagnosis not present

## 2018-08-31 DIAGNOSIS — N2581 Secondary hyperparathyroidism of renal origin: Secondary | ICD-10-CM | POA: Diagnosis not present

## 2018-08-31 DIAGNOSIS — R0789 Other chest pain: Secondary | ICD-10-CM | POA: Diagnosis not present

## 2018-08-31 DIAGNOSIS — Z794 Long term (current) use of insulin: Secondary | ICD-10-CM | POA: Diagnosis not present

## 2018-08-31 DIAGNOSIS — G8929 Other chronic pain: Secondary | ICD-10-CM | POA: Diagnosis not present

## 2018-08-31 DIAGNOSIS — I120 Hypertensive chronic kidney disease with stage 5 chronic kidney disease or end stage renal disease: Secondary | ICD-10-CM | POA: Diagnosis not present

## 2018-08-31 DIAGNOSIS — E1122 Type 2 diabetes mellitus with diabetic chronic kidney disease: Secondary | ICD-10-CM | POA: Diagnosis not present

## 2018-08-31 DIAGNOSIS — Z888 Allergy status to other drugs, medicaments and biological substances status: Secondary | ICD-10-CM | POA: Diagnosis not present

## 2018-09-01 DIAGNOSIS — N186 End stage renal disease: Secondary | ICD-10-CM | POA: Diagnosis not present

## 2018-09-01 DIAGNOSIS — J90 Pleural effusion, not elsewhere classified: Secondary | ICD-10-CM | POA: Diagnosis not present

## 2018-09-01 DIAGNOSIS — R0602 Shortness of breath: Secondary | ICD-10-CM | POA: Diagnosis not present

## 2018-09-01 DIAGNOSIS — R9389 Abnormal findings on diagnostic imaging of other specified body structures: Secondary | ICD-10-CM | POA: Diagnosis not present

## 2018-09-02 DIAGNOSIS — Z992 Dependence on renal dialysis: Secondary | ICD-10-CM | POA: Diagnosis not present

## 2018-09-02 DIAGNOSIS — N2581 Secondary hyperparathyroidism of renal origin: Secondary | ICD-10-CM | POA: Diagnosis not present

## 2018-09-02 DIAGNOSIS — N186 End stage renal disease: Secondary | ICD-10-CM | POA: Diagnosis not present

## 2018-09-02 DIAGNOSIS — E875 Hyperkalemia: Secondary | ICD-10-CM | POA: Diagnosis not present

## 2018-09-02 DIAGNOSIS — D509 Iron deficiency anemia, unspecified: Secondary | ICD-10-CM | POA: Diagnosis not present

## 2018-09-02 DIAGNOSIS — R2241 Localized swelling, mass and lump, right lower limb: Secondary | ICD-10-CM | POA: Diagnosis not present

## 2018-09-03 ENCOUNTER — Encounter (HOSPITAL_COMMUNITY): Payer: Self-pay

## 2018-09-03 ENCOUNTER — Emergency Department (HOSPITAL_COMMUNITY)
Admission: EM | Admit: 2018-09-03 | Discharge: 2018-09-03 | Disposition: A | Discharge status: Home / Self Care | Payer: Medicare Other | Attending: Emergency Medicine | Admitting: Emergency Medicine

## 2018-09-03 ENCOUNTER — Other Ambulatory Visit: Payer: Self-pay

## 2018-09-03 DIAGNOSIS — I5042 Chronic combined systolic (congestive) and diastolic (congestive) heart failure: Secondary | ICD-10-CM | POA: Insufficient documentation

## 2018-09-03 DIAGNOSIS — Z79899 Other long term (current) drug therapy: Secondary | ICD-10-CM | POA: Diagnosis not present

## 2018-09-03 DIAGNOSIS — G8929 Other chronic pain: Secondary | ICD-10-CM | POA: Diagnosis not present

## 2018-09-03 DIAGNOSIS — R1110 Vomiting, unspecified: Secondary | ICD-10-CM | POA: Diagnosis present

## 2018-09-03 DIAGNOSIS — Z87891 Personal history of nicotine dependence: Secondary | ICD-10-CM | POA: Insufficient documentation

## 2018-09-03 DIAGNOSIS — Z7901 Long term (current) use of anticoagulants: Secondary | ICD-10-CM | POA: Diagnosis not present

## 2018-09-03 DIAGNOSIS — N186 End stage renal disease: Secondary | ICD-10-CM | POA: Diagnosis not present

## 2018-09-03 DIAGNOSIS — R1111 Vomiting without nausea: Secondary | ICD-10-CM | POA: Diagnosis not present

## 2018-09-03 DIAGNOSIS — I132 Hypertensive heart and chronic kidney disease with heart failure and with stage 5 chronic kidney disease, or end stage renal disease: Secondary | ICD-10-CM | POA: Insufficient documentation

## 2018-09-03 DIAGNOSIS — R1084 Generalized abdominal pain: Secondary | ICD-10-CM | POA: Diagnosis not present

## 2018-09-03 DIAGNOSIS — R112 Nausea with vomiting, unspecified: Secondary | ICD-10-CM | POA: Diagnosis not present

## 2018-09-03 DIAGNOSIS — E1122 Type 2 diabetes mellitus with diabetic chronic kidney disease: Secondary | ICD-10-CM | POA: Diagnosis not present

## 2018-09-03 DIAGNOSIS — R109 Unspecified abdominal pain: Secondary | ICD-10-CM

## 2018-09-03 DIAGNOSIS — I10 Essential (primary) hypertension: Secondary | ICD-10-CM | POA: Diagnosis not present

## 2018-09-03 DIAGNOSIS — R110 Nausea: Secondary | ICD-10-CM | POA: Diagnosis not present

## 2018-09-03 LAB — LIPASE, BLOOD: LIPASE: 34 U/L (ref 11–51)

## 2018-09-03 LAB — CBC WITH DIFFERENTIAL/PLATELET
Abs Immature Granulocytes: 0.01 10*3/uL (ref 0.00–0.07)
Basophils Absolute: 0 10*3/uL (ref 0.0–0.1)
Basophils Relative: 1 %
Eosinophils Absolute: 0.4 10*3/uL (ref 0.0–0.5)
Eosinophils Relative: 11 %
HEMATOCRIT: 28.6 % — AB (ref 39.0–52.0)
Hemoglobin: 8.4 g/dL — ABNORMAL LOW (ref 13.0–17.0)
Immature Granulocytes: 0 %
Lymphocytes Relative: 15 %
Lymphs Abs: 0.5 10*3/uL — ABNORMAL LOW (ref 0.7–4.0)
MCH: 24.6 pg — ABNORMAL LOW (ref 26.0–34.0)
MCHC: 29.4 g/dL — ABNORMAL LOW (ref 30.0–36.0)
MCV: 83.6 fL (ref 80.0–100.0)
Monocytes Absolute: 0.5 10*3/uL (ref 0.1–1.0)
Monocytes Relative: 13 %
Neutro Abs: 2.1 10*3/uL (ref 1.7–7.7)
Neutrophils Relative %: 60 %
Platelets: 127 10*3/uL — ABNORMAL LOW (ref 150–400)
RBC: 3.42 MIL/uL — ABNORMAL LOW (ref 4.22–5.81)
RDW: 20.5 % — ABNORMAL HIGH (ref 11.5–15.5)
WBC: 3.5 10*3/uL — ABNORMAL LOW (ref 4.0–10.5)
nRBC: 0 % (ref 0.0–0.2)

## 2018-09-03 LAB — COMPREHENSIVE METABOLIC PANEL
ALT: 11 U/L (ref 0–44)
AST: 22 U/L (ref 15–41)
Albumin: 3 g/dL — ABNORMAL LOW (ref 3.5–5.0)
Alkaline Phosphatase: 90 U/L (ref 38–126)
Anion gap: 10 (ref 5–15)
BUN: 27 mg/dL — ABNORMAL HIGH (ref 6–20)
CO2: 31 mmol/L (ref 22–32)
Calcium: 8.9 mg/dL (ref 8.9–10.3)
Chloride: 97 mmol/L — ABNORMAL LOW (ref 98–111)
Creatinine, Ser: 6.08 mg/dL — ABNORMAL HIGH (ref 0.61–1.24)
GFR calc non Af Amer: 10 mL/min — ABNORMAL LOW (ref >60)
GFR, EST AFRICAN AMERICAN: 11 mL/min — AB (ref >60)
Glucose, Bld: 72 mg/dL (ref 70–99)
Potassium: 4.4 mmol/L (ref 3.5–5.1)
Sodium: 138 mmol/L (ref 135–145)
Total Bilirubin: 1.1 mg/dL (ref 0.3–1.2)
Total Protein: 8.5 g/dL — ABNORMAL HIGH (ref 6.5–8.1)

## 2018-09-03 LAB — ETHANOL: Alcohol, Ethyl (B): <10 mg/dL (ref <10)

## 2018-09-03 MED ORDER — PROMETHAZINE HCL 25 MG/ML IJ SOLN
25.0000 mg | Freq: Once | INTRAMUSCULAR | Status: AC
  Administered 2018-09-03: 25 mg via INTRAVENOUS
  Filled 2018-09-03: qty 1

## 2018-09-03 MED ORDER — SODIUM CHLORIDE 0.9% FLUSH: 3.0000 mL | Freq: Once | INTRAVENOUS | Status: DC

## 2018-09-03 MED ORDER — HYDROMORPHONE HCL 1 MG/ML IJ SOLN
1.0000 mg | Freq: Once | INTRAMUSCULAR | Status: AC
  Administered 2018-09-03: 1 mg via INTRAVENOUS
  Filled 2018-09-03: qty 1

## 2018-09-03 MED ORDER — ONDANSETRON 4 MG PO TBDP
4.0000 mg | ORAL_TABLET | Freq: Once | ORAL | Status: DC | PRN
  Filled 2018-09-03: qty 1

## 2018-09-03 NOTE — ED Notes (Signed)
Bed: WA17 Expected date:  Expected time:  Means of arrival:  Comments: NV

## 2018-09-03 NOTE — ED Notes (Signed)
Patient requesting pain medication. Patient reminded that we are waiting for a doctor.

## 2018-09-03 NOTE — ED Notes (Signed)
Patient states he "doesn't make urine". Patient is a dialysis patient.

## 2018-09-03 NOTE — ED Notes (Signed)
After patient refused to change into a gown or to help nursing staff change, and refusing to roll over in the bed, off of his side so we could get accurate vital signs, patient ambulated freely to the bathroom without assistance. Patient stated that he threw up in the bathroom. No sounds/ signs of vomiting were noted.

## 2018-09-03 NOTE — Discharge Instructions (Addendum)
Return here as needed.  Follow-up with your primary doctor.

## 2018-09-03 NOTE — ED Notes (Signed)
Patient stated his pain is increasing. C/C N/V, no episodes of vomiting noted at this time. Patient states, "I should just go over to [facility] and I will be seen quicker." Patient informed of his right to leave. Patient requesting pain medication at this time. Patient starts moaning when I enter the room, no episodes of moaning noted when staff not in the room.

## 2018-09-03 NOTE — ED Triage Notes (Signed)
Per ems: pt coming from home c/o N/V that started today. Hx of pancreatitis and stated it feels similar. Had dialysis today.

## 2018-09-04 ENCOUNTER — Encounter (HOSPITAL_COMMUNITY): Payer: Self-pay | Admitting: Emergency Medicine

## 2018-09-04 ENCOUNTER — Emergency Department (HOSPITAL_COMMUNITY)
Admission: EM | Admit: 2018-09-04 | Discharge: 2018-09-04 | Disposition: A | Discharge status: Home / Self Care | Payer: Medicare Other | Attending: Emergency Medicine | Admitting: Emergency Medicine

## 2018-09-04 DIAGNOSIS — Z992 Dependence on renal dialysis: Secondary | ICD-10-CM | POA: Diagnosis not present

## 2018-09-04 DIAGNOSIS — I132 Hypertensive heart and chronic kidney disease with heart failure and with stage 5 chronic kidney disease, or end stage renal disease: Secondary | ICD-10-CM | POA: Insufficient documentation

## 2018-09-04 DIAGNOSIS — M5440 Lumbago with sciatica, unspecified side: Secondary | ICD-10-CM | POA: Diagnosis not present

## 2018-09-04 DIAGNOSIS — N2581 Secondary hyperparathyroidism of renal origin: Secondary | ICD-10-CM | POA: Diagnosis not present

## 2018-09-04 DIAGNOSIS — D509 Iron deficiency anemia, unspecified: Secondary | ICD-10-CM | POA: Diagnosis not present

## 2018-09-04 DIAGNOSIS — R197 Diarrhea, unspecified: Secondary | ICD-10-CM | POA: Insufficient documentation

## 2018-09-04 DIAGNOSIS — Z7901 Long term (current) use of anticoagulants: Secondary | ICD-10-CM | POA: Insufficient documentation

## 2018-09-04 DIAGNOSIS — R2241 Localized swelling, mass and lump, right lower limb: Secondary | ICD-10-CM | POA: Diagnosis not present

## 2018-09-04 DIAGNOSIS — G8929 Other chronic pain: Secondary | ICD-10-CM | POA: Diagnosis not present

## 2018-09-04 DIAGNOSIS — R112 Nausea with vomiting, unspecified: Secondary | ICD-10-CM | POA: Diagnosis not present

## 2018-09-04 DIAGNOSIS — R110 Nausea: Secondary | ICD-10-CM | POA: Diagnosis not present

## 2018-09-04 DIAGNOSIS — N186 End stage renal disease: Secondary | ICD-10-CM | POA: Diagnosis not present

## 2018-09-04 DIAGNOSIS — R1084 Generalized abdominal pain: Secondary | ICD-10-CM | POA: Insufficient documentation

## 2018-09-04 DIAGNOSIS — Z79899 Other long term (current) drug therapy: Secondary | ICD-10-CM | POA: Insufficient documentation

## 2018-09-04 DIAGNOSIS — I5042 Chronic combined systolic (congestive) and diastolic (congestive) heart failure: Secondary | ICD-10-CM | POA: Insufficient documentation

## 2018-09-04 DIAGNOSIS — E1122 Type 2 diabetes mellitus with diabetic chronic kidney disease: Secondary | ICD-10-CM | POA: Insufficient documentation

## 2018-09-04 DIAGNOSIS — R109 Unspecified abdominal pain: Secondary | ICD-10-CM

## 2018-09-04 DIAGNOSIS — I10 Essential (primary) hypertension: Secondary | ICD-10-CM | POA: Diagnosis not present

## 2018-09-04 DIAGNOSIS — R1111 Vomiting without nausea: Secondary | ICD-10-CM | POA: Diagnosis not present

## 2018-09-04 DIAGNOSIS — Z87891 Personal history of nicotine dependence: Secondary | ICD-10-CM | POA: Diagnosis not present

## 2018-09-04 DIAGNOSIS — R0902 Hypoxemia: Secondary | ICD-10-CM | POA: Diagnosis not present

## 2018-09-04 DIAGNOSIS — E875 Hyperkalemia: Secondary | ICD-10-CM | POA: Diagnosis not present

## 2018-09-04 DIAGNOSIS — M545 Low back pain: Secondary | ICD-10-CM | POA: Diagnosis not present

## 2018-09-04 LAB — CBC WITH DIFFERENTIAL/PLATELET
Abs Immature Granulocytes: 0.03 10*3/uL (ref 0.00–0.07)
Basophils Absolute: 0 10*3/uL (ref 0.0–0.1)
Basophils Relative: 1 %
EOS ABS: 0.3 10*3/uL (ref 0.0–0.5)
EOS PCT: 7 %
HEMATOCRIT: 27.3 % — AB (ref 39.0–52.0)
Hemoglobin: 8.3 g/dL — ABNORMAL LOW (ref 13.0–17.0)
Immature Granulocytes: 1 %
Lymphocytes Relative: 15 %
Lymphs Abs: 0.6 10*3/uL — ABNORMAL LOW (ref 0.7–4.0)
MCH: 25.5 pg — ABNORMAL LOW (ref 26.0–34.0)
MCHC: 30.4 g/dL (ref 30.0–36.0)
MCV: 83.7 fL (ref 80.0–100.0)
MONO ABS: 0.7 10*3/uL (ref 0.1–1.0)
MONOS PCT: 15 %
Neutro Abs: 2.7 10*3/uL (ref 1.7–7.7)
Neutrophils Relative %: 61 %
Platelets: 103 10*3/uL — ABNORMAL LOW (ref 150–400)
RBC: 3.26 MIL/uL — ABNORMAL LOW (ref 4.22–5.81)
RDW: 20 % — ABNORMAL HIGH (ref 11.5–15.5)
WBC: 4.4 10*3/uL (ref 4.0–10.5)
nRBC: 0 % (ref 0.0–0.2)

## 2018-09-04 LAB — COMPREHENSIVE METABOLIC PANEL
ALT: 17 U/L (ref 0–44)
AST: 45 U/L — ABNORMAL HIGH (ref 15–41)
Albumin: 2.9 g/dL — ABNORMAL LOW (ref 3.5–5.0)
Alkaline Phosphatase: 89 U/L (ref 38–126)
Anion gap: 18 — ABNORMAL HIGH (ref 5–15)
BILIRUBIN TOTAL: 1.3 mg/dL — AB (ref 0.3–1.2)
BUN: 37 mg/dL — AB (ref 6–20)
CO2: 22 mmol/L (ref 22–32)
Calcium: 9.4 mg/dL (ref 8.9–10.3)
Chloride: 98 mmol/L (ref 98–111)
Creatinine, Ser: 8.24 mg/dL — ABNORMAL HIGH (ref 0.61–1.24)
GFR calc Af Amer: 8 mL/min — ABNORMAL LOW (ref >60)
GFR calc non Af Amer: 7 mL/min — ABNORMAL LOW (ref >60)
Glucose, Bld: 81 mg/dL (ref 70–99)
Potassium: 5.2 mmol/L — ABNORMAL HIGH (ref 3.5–5.1)
Sodium: 138 mmol/L (ref 135–145)
TOTAL PROTEIN: 8.4 g/dL — AB (ref 6.5–8.1)

## 2018-09-04 LAB — LIPASE, BLOOD: Lipase: 35 U/L (ref 11–51)

## 2018-09-04 MED ORDER — OXYCODONE HCL 10 MG PO TABS: 10.0000 mg | ORAL_TABLET | ORAL | Status: DC

## 2018-09-04 MED ORDER — OXYCODONE HCL 5 MG PO TABS
10.0000 mg | ORAL_TABLET | ORAL | Status: DC
  Administered 2018-09-04: 10 mg via ORAL
  Filled 2018-09-04: qty 2

## 2018-09-04 MED ORDER — PROMETHAZINE HCL 25 MG/ML IJ SOLN
12.5000 mg | Freq: Once | INTRAMUSCULAR | Status: AC
  Administered 2018-09-04: 12.5 mg via INTRAVENOUS
  Filled 2018-09-04: qty 1
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
## 2018-09-04 NOTE — ED Notes (Signed)
E-signature not available, verbalized understanding of DC instructions.
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
## 2018-09-04 NOTE — ED Triage Notes (Signed)
BIB EMS from home. Pt reports abdominal pain, N/V X1 day. Was seen at [facility] yesterday for same. MWF dialysis, due this AM. Did have full treatment Wednesday.

## 2018-09-07 ENCOUNTER — Other Ambulatory Visit: Payer: Self-pay

## 2018-09-07 ENCOUNTER — Emergency Department (HOSPITAL_COMMUNITY): Payer: Medicare Other

## 2018-09-07 ENCOUNTER — Encounter (HOSPITAL_COMMUNITY): Payer: Self-pay

## 2018-09-07 ENCOUNTER — Encounter (HOSPITAL_COMMUNITY): Payer: Self-pay | Admitting: Emergency Medicine

## 2018-09-07 ENCOUNTER — Emergency Department (HOSPITAL_COMMUNITY)
Admission: EM | Admit: 2018-09-07 | Discharge: 2018-09-07 | Disposition: A | Discharge status: Home / Self Care | Payer: Medicare Other | Source: Home / Self Care | Attending: Emergency Medicine | Admitting: Emergency Medicine

## 2018-09-07 ENCOUNTER — Emergency Department (HOSPITAL_COMMUNITY)
Admission: EM | Admit: 2018-09-07 | Discharge: 2018-09-07 | Disposition: A | Discharge status: Home / Self Care | Payer: Medicare Other | Attending: Emergency Medicine | Admitting: Emergency Medicine

## 2018-09-07 DIAGNOSIS — G4733 Obstructive sleep apnea (adult) (pediatric): Secondary | ICD-10-CM | POA: Diagnosis not present

## 2018-09-07 DIAGNOSIS — E119 Type 2 diabetes mellitus without complications: Secondary | ICD-10-CM | POA: Insufficient documentation

## 2018-09-07 DIAGNOSIS — Z7901 Long term (current) use of anticoagulants: Secondary | ICD-10-CM

## 2018-09-07 DIAGNOSIS — N186 End stage renal disease: Secondary | ICD-10-CM | POA: Insufficient documentation

## 2018-09-07 DIAGNOSIS — I132 Hypertensive heart and chronic kidney disease with heart failure and with stage 5 chronic kidney disease, or end stage renal disease: Secondary | ICD-10-CM | POA: Diagnosis not present

## 2018-09-07 DIAGNOSIS — Z992 Dependence on renal dialysis: Secondary | ICD-10-CM

## 2018-09-07 DIAGNOSIS — Z79899 Other long term (current) drug therapy: Secondary | ICD-10-CM | POA: Insufficient documentation

## 2018-09-07 DIAGNOSIS — Z881 Allergy status to other antibiotic agents status: Secondary | ICD-10-CM | POA: Diagnosis not present

## 2018-09-07 DIAGNOSIS — I120 Hypertensive chronic kidney disease with stage 5 chronic kidney disease or end stage renal disease: Secondary | ICD-10-CM

## 2018-09-07 DIAGNOSIS — R112 Nausea with vomiting, unspecified: Secondary | ICD-10-CM | POA: Insufficient documentation

## 2018-09-07 DIAGNOSIS — Z888 Allergy status to other drugs, medicaments and biological substances status: Secondary | ICD-10-CM | POA: Diagnosis not present

## 2018-09-07 DIAGNOSIS — I5022 Chronic systolic (congestive) heart failure: Secondary | ICD-10-CM | POA: Diagnosis not present

## 2018-09-07 DIAGNOSIS — J948 Other specified pleural conditions: Secondary | ICD-10-CM | POA: Diagnosis not present

## 2018-09-07 DIAGNOSIS — G8912 Acute post-thoracotomy pain: Secondary | ICD-10-CM | POA: Diagnosis not present

## 2018-09-07 DIAGNOSIS — J90 Pleural effusion, not elsewhere classified: Secondary | ICD-10-CM

## 2018-09-07 DIAGNOSIS — Z940 Kidney transplant status: Secondary | ICD-10-CM | POA: Diagnosis not present

## 2018-09-07 DIAGNOSIS — I5042 Chronic combined systolic (congestive) and diastolic (congestive) heart failure: Secondary | ICD-10-CM | POA: Diagnosis not present

## 2018-09-07 DIAGNOSIS — Z87891 Personal history of nicotine dependence: Secondary | ICD-10-CM | POA: Diagnosis not present

## 2018-09-07 DIAGNOSIS — E1122 Type 2 diabetes mellitus with diabetic chronic kidney disease: Secondary | ICD-10-CM | POA: Diagnosis not present

## 2018-09-07 DIAGNOSIS — R531 Weakness: Secondary | ICD-10-CM

## 2018-09-07 DIAGNOSIS — G8929 Other chronic pain: Secondary | ICD-10-CM | POA: Diagnosis not present

## 2018-09-07 DIAGNOSIS — I10 Essential (primary) hypertension: Secondary | ICD-10-CM | POA: Diagnosis not present

## 2018-09-07 DIAGNOSIS — Z794 Long term (current) use of insulin: Secondary | ICD-10-CM | POA: Diagnosis not present

## 2018-09-07 DIAGNOSIS — R0789 Other chest pain: Secondary | ICD-10-CM | POA: Diagnosis not present

## 2018-09-07 DIAGNOSIS — Z9989 Dependence on other enabling machines and devices: Secondary | ICD-10-CM | POA: Diagnosis not present

## 2018-09-07 DIAGNOSIS — D631 Anemia in chronic kidney disease: Secondary | ICD-10-CM | POA: Diagnosis not present

## 2018-09-07 DIAGNOSIS — Z885 Allergy status to narcotic agent status: Secondary | ICD-10-CM | POA: Diagnosis not present

## 2018-09-07 DIAGNOSIS — Z86718 Personal history of other venous thrombosis and embolism: Secondary | ICD-10-CM | POA: Diagnosis not present

## 2018-09-07 DIAGNOSIS — R079 Chest pain, unspecified: Secondary | ICD-10-CM | POA: Insufficient documentation

## 2018-09-07 DIAGNOSIS — E875 Hyperkalemia: Secondary | ICD-10-CM | POA: Diagnosis not present

## 2018-09-07 DIAGNOSIS — E1159 Type 2 diabetes mellitus with other circulatory complications: Secondary | ICD-10-CM | POA: Diagnosis not present

## 2018-09-07 LAB — COMPREHENSIVE METABOLIC PANEL
ALBUMIN: 2.9 g/dL — AB (ref 3.5–5.0)
ALT: 30 U/L (ref 0–44)
AST: 46 U/L — ABNORMAL HIGH (ref 15–41)
Alkaline Phosphatase: 113 U/L (ref 38–126)
Anion gap: 16 — ABNORMAL HIGH (ref 5–15)
BUN: 42 mg/dL — ABNORMAL HIGH (ref 6–20)
CO2: 23 mmol/L (ref 22–32)
Calcium: 8.9 mg/dL (ref 8.9–10.3)
Chloride: 97 mmol/L — ABNORMAL LOW (ref 98–111)
Creatinine, Ser: 9.37 mg/dL — ABNORMAL HIGH (ref 0.61–1.24)
GFR calc Af Amer: 7 mL/min — ABNORMAL LOW (ref >60)
GFR calc non Af Amer: 6 mL/min — ABNORMAL LOW (ref >60)
Glucose, Bld: 120 mg/dL — ABNORMAL HIGH (ref 70–99)
Potassium: 4.7 mmol/L (ref 3.5–5.1)
Sodium: 136 mmol/L (ref 135–145)
Total Bilirubin: 0.8 mg/dL (ref 0.3–1.2)
Total Protein: 8.8 g/dL — ABNORMAL HIGH (ref 6.5–8.1)

## 2018-09-07 LAB — I-STAT TROPONIN, ED: Troponin i, poc: 0.04 ng/mL (ref 0.00–0.08)

## 2018-09-07 LAB — CBG MONITORING, ED: Glucose-Capillary: 98 mg/dL (ref 70–99)

## 2018-09-07 LAB — LIPASE, BLOOD: Lipase: 48 U/L (ref 11–51)

## 2018-09-07 MED ORDER — FENTANYL CITRATE (PF) 100 MCG/2ML IJ SOLN
100.0000 ug | Freq: Once | INTRAMUSCULAR | Status: AC
  Administered 2018-09-07: 100 ug via INTRAVENOUS
  Filled 2018-09-07: qty 2

## 2018-09-07 MED ORDER — SODIUM CHLORIDE 0.9% FLUSH: 3.0000 mL | Freq: Once | INTRAVENOUS | Status: DC

## 2018-09-07 MED ORDER — PROMETHAZINE HCL 25 MG/ML IJ SOLN
25.0000 mg | Freq: Once | INTRAMUSCULAR | Status: AC
  Administered 2018-09-07: 25 mg via INTRAVENOUS
  Filled 2018-09-07: qty 1

## 2018-09-07 MED ORDER — GABAPENTIN 100 MG PO CAPS
200.0000 mg | ORAL_CAPSULE | Freq: Two times a day (BID) | ORAL | Status: DC
  Administered 2018-09-07: 200 mg via ORAL
  Filled 2018-09-07: qty 2

## 2018-09-07 NOTE — ED Notes (Signed)
P;t will not keep his bp cuff in place

## 2018-09-07 NOTE — ED Notes (Signed)
Pt escorted to front lobby in [facility] by security

## 2018-09-07 NOTE — ED Triage Notes (Signed)
Pt refused all care at DC. EDP aware.

## 2018-09-07 NOTE — ED Notes (Signed)
Patient transported to X-ray 

## 2018-09-07 NOTE — ED Notes (Signed)
Patient using bathroom, unable to draw blood at this time.

## 2018-09-07 NOTE — ED Provider Notes (Signed)
  Physical Exam  BP (!) 173/117   Pulse 90   Temp 97.9 F (36.6 C) (Oral)   Resp 17   SpO2 98%   Physical Exam Vitals signs and nursing note reviewed.  Constitutional:      General: He is not in acute distress.    Appearance: He is well-developed. He is not diaphoretic.  HENT:     Head: Normocephalic and atraumatic.  Eyes:     General: No scleral icterus.    Conjunctiva/sclera: Conjunctivae normal.  Neck:     Musculoskeletal: Normal range of motion.  Pulmonary:     Effort: Pulmonary effort is normal. No respiratory distress.  Skin:    Findings: No rash.  Neurological:     Mental Status: He is alert.     ED Course/Procedures     Procedures  MDM  Care handed off from previous provider PA Dodge.  Please see his note for further detail.  Briefly, patient presents to the ED for right-sided chest pain and chest tightness.  Associated nausea and vomiting.  Patient is status post right-sided thoracotomy for empyema and decortication of collapsed right lower lung lobe 3 months ago.  Work-up today shows worsening chest imaging.  Other lab work is reassuring.  CT surgery was consulted and awaiting their recommendations for disposition.  8:17 AM Dr. Darcey Nora evaluated the patient and has prescribed gabapentin for his symptoms.  He recommends discharge home with outpatient follow-up.  Please see his note for further detail.  Patient is hemodynamically stable, in NAD, and able to ambulate in the ED. Evaluation does not show pathology that would require ongoing emergent intervention or inpatient treatment. I explained the diagnosis to the patient. Pain has been managed and has no complaints prior to discharge. Patient is comfortable with above plan and is stable for discharge at this time. All questions were answered prior to disposition. Strict return precautions for returning to the ED were discussed. Encouraged follow up with PCP.    Portions of this note were generated with Geographical information systems officer. Dictation errors may occur despite best attempts at proofreading.        Delia Heady, PA-C 09/07/18 Azzie Almas    Duffy Bruce, MD 09/07/18 1515
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
## 2018-09-07 NOTE — ED Triage Notes (Signed)
Patient reports mid/right chest tightness/numbness onset this evening with multiple emesis , he did not miss hemodialysis treatment , mild SOB , denies fever or chills .

## 2018-09-07 NOTE — ED Triage Notes (Signed)
PT SITTING ON SIDE OF BED . Pt refused to sit on stretcher to have Iv started . Pt stuck arm out for IV to be started . Unable to located Iv site .

## 2018-09-07 NOTE — Progress Notes (Addendum)
  Subjective: Patient examined, recent CXRs and CT chest reviewed Pt had R thorocotomy and drainage of chronic empyema at [facility] Dec 2019. Cultures negative and path with inflammatory changes- no malignancy  He was admitted Jan 2020 to [facility] with post thorocotomy pain and re accumulation of lateral  R effusion which was drained with pigtail by IR - flui neg cultures The catheter was removed which left a space where the fluid was because the lung is fixed by scarring and cannot fill the space  CXR now shows partial re accumulation of fluid in the space- expected. No evidence of infection.Does not need tube placement. Last CT shows pleural thickening and atelectasis R base  His main problem is post thoracotomy pain. Will give patient neurontin 200 bid-would not use narcotics in this patient    Objective: Vital signs in last 24 hours: Temp:  [97.9 F (36.6 C)] 97.9 F (36.6 C) (02/17 0259) Pulse Rate:  [90] 90 (02/17 0259) Resp:  [17-23] 17 (02/17 0515) BP: (157-175)/(112-117) 173/117 (02/17 0600) SpO2:  [98 %] 98 % (02/17 0305)  Hemodynamic parameters for last 24 hours:  stable  Intake/Output from previous day: No intake/output data recorded. Intake/Output this shift: No intake/output data recorded.  Well healed R thorocotomy scar Tender under R breast crease from thoracotomy pain Dec BS R base NSR w/o murmur  Lab Results: No results for input(s): WBC, HGB, HCT, PLT in the last 72 hours. BMET:  Recent Labs    09/07/18 0304  NA 136  K 4.7  CL 97*  CO2 23  GLUCOSE 120*  BUN 42*  CREATININE 9.37*  CALCIUM 8.9    PT/INR: No results for input(s): LABPROT, INR in the last 72 hours. ABG    Component Value Date/Time   HCO3 19.3 (L) 12/31/2014 0801   TCO2 31 06/15/2018 2129   ACIDBASEDEF 7.0 (H) 12/31/2014 0801   O2SAT 45.0 12/31/2014 0801   CBG (last 3)  No results for input(s): GLUCAP in the last 72 hours.  Assessment/Plan: S/P   Post thoracotomy pain No  evidence of infected pleural space Start neurontin for symptoms No surgery needed  LOS: 0 days    Tharon Aquas Trigt III 09/07/2018

## 2018-09-07 NOTE — ED Triage Notes (Signed)
Security called to escort PT out for DC

## 2018-09-07 NOTE — ED Triage Notes (Signed)
PT refused to sit on bed . Pt hand swelling hard IV stick.

## 2018-09-07 NOTE — Progress Notes (Signed)
PIV consult: Arrived to ED- pt has been discharged. Cancel consult.

## 2018-09-07 NOTE — ED Notes (Signed)
Unable to collect lavender to for cbc blood test,  Pt moving and will not be still. Nurse notified.

## 2018-09-07 NOTE — ED Notes (Signed)
Pt requiesting nasal 02  Given  02 sat not picking up

## 2018-09-07 NOTE — ED Triage Notes (Signed)
[name]  Aware Blood has not been drawn . RN, Lab tech attempted several times to draw blood . Pt refused.

## 2018-09-07 NOTE — ED Triage Notes (Signed)
Pt states dizziness weakness and vomiting. Was discharged from ED earlier this morning. AOX4.

## 2018-09-07 NOTE — ED Triage Notes (Signed)
Pt  Refused blood draw from Lab .

## 2018-09-07 NOTE — ED Notes (Addendum)
Patient near syncope in lobby, rechecking vss and will continue to re-evaluate while awaiting bed. Alert and oriented, placed in front of staff for further eval

## 2018-09-08 DIAGNOSIS — R079 Chest pain, unspecified: Secondary | ICD-10-CM | POA: Diagnosis not present

## 2018-09-08 DIAGNOSIS — N186 End stage renal disease: Secondary | ICD-10-CM | POA: Diagnosis not present

## 2018-09-08 DIAGNOSIS — D631 Anemia in chronic kidney disease: Secondary | ICD-10-CM | POA: Diagnosis not present

## 2018-09-08 DIAGNOSIS — I10 Essential (primary) hypertension: Secondary | ICD-10-CM | POA: Diagnosis not present

## 2018-09-08 DIAGNOSIS — E875 Hyperkalemia: Secondary | ICD-10-CM | POA: Diagnosis not present
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
## 2018-09-11 DIAGNOSIS — R2241 Localized swelling, mass and lump, right lower limb: Secondary | ICD-10-CM | POA: Diagnosis not present

## 2018-09-11 DIAGNOSIS — N2581 Secondary hyperparathyroidism of renal origin: Secondary | ICD-10-CM | POA: Diagnosis not present

## 2018-09-11 DIAGNOSIS — E875 Hyperkalemia: Secondary | ICD-10-CM | POA: Diagnosis not present

## 2018-09-11 DIAGNOSIS — Z992 Dependence on renal dialysis: Secondary | ICD-10-CM | POA: Diagnosis not present

## 2018-09-11 DIAGNOSIS — D509 Iron deficiency anemia, unspecified: Secondary | ICD-10-CM | POA: Diagnosis not present

## 2018-09-11 DIAGNOSIS — N186 End stage renal disease: Secondary | ICD-10-CM | POA: Diagnosis not present

## 2018-09-13 DIAGNOSIS — I770 Arteriovenous fistula, acquired: Secondary | ICD-10-CM | POA: Diagnosis not present

## 2018-09-13 DIAGNOSIS — G4733 Obstructive sleep apnea (adult) (pediatric): Secondary | ICD-10-CM | POA: Diagnosis not present

## 2018-09-13 DIAGNOSIS — I82409 Acute embolism and thrombosis of unspecified deep veins of unspecified lower extremity: Secondary | ICD-10-CM | POA: Diagnosis not present

## 2018-09-13 DIAGNOSIS — L03113 Cellulitis of right upper limb: Secondary | ICD-10-CM | POA: Diagnosis not present

## 2018-09-13 DIAGNOSIS — I5022 Chronic systolic (congestive) heart failure: Secondary | ICD-10-CM | POA: Diagnosis not present

## 2018-09-13 DIAGNOSIS — Z7901 Long term (current) use of anticoagulants: Secondary | ICD-10-CM | POA: Diagnosis not present

## 2018-09-13 DIAGNOSIS — I82B11 Acute embolism and thrombosis of right subclavian vein: Secondary | ICD-10-CM | POA: Diagnosis not present

## 2018-09-13 DIAGNOSIS — Z794 Long term (current) use of insulin: Secondary | ICD-10-CM | POA: Diagnosis not present

## 2018-09-13 DIAGNOSIS — I517 Cardiomegaly: Secondary | ICD-10-CM | POA: Diagnosis not present

## 2018-09-13 DIAGNOSIS — Z8709 Personal history of other diseases of the respiratory system: Secondary | ICD-10-CM | POA: Diagnosis not present

## 2018-09-13 DIAGNOSIS — D631 Anemia in chronic kidney disease: Secondary | ICD-10-CM | POA: Diagnosis not present

## 2018-09-13 DIAGNOSIS — F419 Anxiety disorder, unspecified: Secondary | ICD-10-CM | POA: Diagnosis not present

## 2018-09-13 DIAGNOSIS — J948 Other specified pleural conditions: Secondary | ICD-10-CM | POA: Diagnosis not present

## 2018-09-13 DIAGNOSIS — K219 Gastro-esophageal reflux disease without esophagitis: Secondary | ICD-10-CM | POA: Diagnosis not present

## 2018-09-13 DIAGNOSIS — I82621 Acute embolism and thrombosis of deep veins of right upper extremity: Secondary | ICD-10-CM | POA: Diagnosis not present

## 2018-09-13 DIAGNOSIS — E1122 Type 2 diabetes mellitus with diabetic chronic kidney disease: Secondary | ICD-10-CM | POA: Diagnosis not present

## 2018-09-13 DIAGNOSIS — I132 Hypertensive heart and chronic kidney disease with heart failure and with stage 5 chronic kidney disease, or end stage renal disease: Secondary | ICD-10-CM | POA: Diagnosis not present

## 2018-09-13 DIAGNOSIS — L0390 Cellulitis, unspecified: Secondary | ICD-10-CM | POA: Diagnosis not present

## 2018-09-13 DIAGNOSIS — N186 End stage renal disease: Secondary | ICD-10-CM | POA: Diagnosis not present

## 2018-09-13 DIAGNOSIS — Z992 Dependence on renal dialysis: Secondary | ICD-10-CM | POA: Diagnosis not present

## 2018-09-13 DIAGNOSIS — I129 Hypertensive chronic kidney disease with stage 1 through stage 4 chronic kidney disease, or unspecified chronic kidney disease: Secondary | ICD-10-CM | POA: Diagnosis not present

## 2018-09-13 DIAGNOSIS — K861 Other chronic pancreatitis: Secondary | ICD-10-CM | POA: Diagnosis not present

## 2018-09-13 DIAGNOSIS — I82C11 Acute embolism and thrombosis of right internal jugular vein: Secondary | ICD-10-CM | POA: Diagnosis not present

## 2018-09-13 DIAGNOSIS — Z79899 Other long term (current) drug therapy: Secondary | ICD-10-CM | POA: Diagnosis not present

## 2018-09-13 DIAGNOSIS — I82611 Acute embolism and thrombosis of superficial veins of right upper extremity: Secondary | ICD-10-CM | POA: Diagnosis not present

## 2018-09-13 DIAGNOSIS — K863 Pseudocyst of pancreas: Secondary | ICD-10-CM | POA: Diagnosis not present

## 2018-09-13 DIAGNOSIS — F329 Major depressive disorder, single episode, unspecified: Secondary | ICD-10-CM | POA: Diagnosis not present

## 2018-09-13 DIAGNOSIS — R109 Unspecified abdominal pain: Secondary | ICD-10-CM | POA: Diagnosis not present

## 2018-09-13 DIAGNOSIS — Z888 Allergy status to other drugs, medicaments and biological substances status: Secondary | ICD-10-CM | POA: Diagnosis not present

## 2018-09-13 DIAGNOSIS — G8929 Other chronic pain: Secondary | ICD-10-CM | POA: Diagnosis not present

## 2018-09-13 DIAGNOSIS — Z940 Kidney transplant status: Secondary | ICD-10-CM | POA: Diagnosis not present

## 2018-09-13 DIAGNOSIS — R0789 Other chest pain: Secondary | ICD-10-CM | POA: Diagnosis not present

## 2018-09-13 DIAGNOSIS — E119 Type 2 diabetes mellitus without complications: Secondary | ICD-10-CM | POA: Diagnosis not present

## 2018-09-13 DIAGNOSIS — E875 Hyperkalemia: Secondary | ICD-10-CM | POA: Diagnosis not present

## 2018-09-14 DIAGNOSIS — I82621 Acute embolism and thrombosis of deep veins of right upper extremity: Secondary | ICD-10-CM | POA: Diagnosis not present

## 2018-09-14 DIAGNOSIS — L03113 Cellulitis of right upper limb: Secondary | ICD-10-CM | POA: Diagnosis not present

## 2018-09-14 DIAGNOSIS — L0390 Cellulitis, unspecified: Secondary | ICD-10-CM | POA: Diagnosis not present

## 2018-09-14 DIAGNOSIS — E875 Hyperkalemia: Secondary | ICD-10-CM | POA: Diagnosis not present

## 2018-09-16 ENCOUNTER — Encounter (HOSPITAL_COMMUNITY): Payer: Self-pay

## 2018-09-16 ENCOUNTER — Emergency Department (HOSPITAL_COMMUNITY)
Admission: EM | Admit: 2018-09-16 | Discharge: 2018-09-16 | Disposition: A | Discharge status: Home / Self Care | Payer: Medicare Other | Attending: Emergency Medicine | Admitting: Emergency Medicine

## 2018-09-16 DIAGNOSIS — Z87891 Personal history of nicotine dependence: Secondary | ICD-10-CM | POA: Insufficient documentation

## 2018-09-16 DIAGNOSIS — Z79899 Other long term (current) drug therapy: Secondary | ICD-10-CM | POA: Insufficient documentation

## 2018-09-16 DIAGNOSIS — I132 Hypertensive heart and chronic kidney disease with heart failure and with stage 5 chronic kidney disease, or end stage renal disease: Secondary | ICD-10-CM | POA: Diagnosis not present

## 2018-09-16 DIAGNOSIS — I5042 Chronic combined systolic (congestive) and diastolic (congestive) heart failure: Secondary | ICD-10-CM | POA: Diagnosis not present

## 2018-09-16 DIAGNOSIS — N185 Chronic kidney disease, stage 5: Secondary | ICD-10-CM | POA: Insufficient documentation

## 2018-09-16 DIAGNOSIS — M79601 Pain in right arm: Secondary | ICD-10-CM | POA: Diagnosis present

## 2018-09-16 DIAGNOSIS — I82621 Acute embolism and thrombosis of deep veins of right upper extremity: Secondary | ICD-10-CM | POA: Insufficient documentation

## 2018-09-16 DIAGNOSIS — E119 Type 2 diabetes mellitus without complications: Secondary | ICD-10-CM | POA: Insufficient documentation

## 2018-09-16 MED ORDER — BUSPIRONE HCL 5 MG PO TABS
5.00 | ORAL_TABLET | ORAL | Status: DC
Start: 2018-09-14 — End: 2018-09-16

## 2018-09-16 MED ORDER — ONDANSETRON HCL 4 MG/2ML IJ SOLN
4.00 | INTRAMUSCULAR | Status: DC
Start: ? — End: 2018-09-16

## 2018-09-16 MED ORDER — ZOLPIDEM TARTRATE 5 MG PO TABS
5.00 | ORAL_TABLET | ORAL | Status: DC
Start: ? — End: 2018-09-16

## 2018-09-16 MED ORDER — LINEZOLID 600 MG PO TABS
600.0000 mg | ORAL_TABLET | Freq: Once | ORAL | Status: AC
  Administered 2018-09-16: 600 mg via ORAL
  Filled 2018-09-16: qty 1

## 2018-09-16 MED ORDER — DIPHENHYDRAMINE HCL 50 MG/ML IJ SOLN
12.50 | INTRAMUSCULAR | Status: DC
Start: ? — End: 2018-09-16

## 2018-09-16 MED ORDER — ALTEPLASE 2 MG IJ SOLR
2.00 | INTRAMUSCULAR | Status: DC
Start: ? — End: 2018-09-16

## 2018-09-16 MED ORDER — ENOXAPARIN SODIUM 80 MG/0.8ML ~~LOC~~ SOLN
1.0000 mg/kg | Freq: Once | SUBCUTANEOUS | Status: AC
  Administered 2018-09-16: 75 mg via SUBCUTANEOUS
  Filled 2018-09-16: qty 0.75

## 2018-09-16 MED ORDER — SODIUM CHLORIDE (PF) 0.9 % IJ SOLN
50.00 | INTRAMUSCULAR | Status: DC
Start: ? — End: 2018-09-16

## 2018-09-16 MED ORDER — ONDANSETRON 4 MG PO TBDP
4.00 | ORAL_TABLET | ORAL | Status: DC
Start: ? — End: 2018-09-16

## 2018-09-16 MED ORDER — ALBUMIN HUMAN 25 % IV SOLN
12.50 | INTRAVENOUS | Status: DC
Start: ? — End: 2018-09-16

## 2018-09-16 MED ORDER — GENERIC EXTERNAL MEDICATION
Status: DC
Start: ? — End: 2018-09-16

## 2018-09-16 MED ORDER — OXYCODONE HCL 10 MG PO TABS
10.00 | ORAL_TABLET | ORAL | Status: DC
Start: ? — End: 2018-09-16

## 2018-09-16 MED ORDER — APIXABAN 5 MG PO TABS
5.0000 mg | ORAL_TABLET | Freq: Once | ORAL | Status: DC
  Filled 2018-09-16: qty 1

## 2018-09-16 MED ORDER — NEPHRO-VITE 0.8 MG PO TABS
1.00 | ORAL_TABLET | ORAL | Status: DC
Start: 2018-09-15 — End: 2018-09-16

## 2018-09-16 MED ORDER — LANTHANUM CARBONATE 500 MG PO CHEW
1000.00 | CHEWABLE_TABLET | ORAL | Status: DC
Start: 2018-09-14 — End: 2018-09-16

## 2018-09-16 MED ORDER — AMLODIPINE BESYLATE 5 MG PO TABS
5.00 | ORAL_TABLET | ORAL | Status: DC
Start: ? — End: 2018-09-16

## 2018-09-16 MED ORDER — PANTOPRAZOLE SODIUM 40 MG PO TBEC
40.00 | DELAYED_RELEASE_TABLET | ORAL | Status: DC
Start: 2018-09-14 — End: 2018-09-16

## 2018-09-16 MED ORDER — NITROGLYCERIN 0.4 MG SL SUBL
.40 | SUBLINGUAL_TABLET | SUBLINGUAL | Status: DC
Start: ? — End: 2018-09-16

## 2018-09-16 MED ORDER — HYDRALAZINE HCL 20 MG/ML IJ SOLN
10.00 | INTRAMUSCULAR | Status: DC
Start: ? — End: 2018-09-16

## 2018-09-16 MED ORDER — SODIUM CHLORIDE 0.9 % IV SOLN
150.00 | INTRAVENOUS | Status: DC
Start: ? — End: 2018-09-16

## 2018-09-16 MED ORDER — HYDRALAZINE HCL 50 MG PO TABS
100.00 | ORAL_TABLET | ORAL | Status: DC
Start: 2018-09-14 — End: 2018-09-16

## 2018-09-16 MED ORDER — HYDROMORPHONE HCL 1 MG/ML IJ SOLN
1.0000 mg | Freq: Once | INTRAMUSCULAR | Status: AC
  Administered 2018-09-16: 1 mg via INTRAMUSCULAR
  Filled 2018-09-16: qty 1

## 2018-09-16 MED ORDER — ENOXAPARIN SODIUM 80 MG/0.8ML ~~LOC~~ SOLN
1.00 | SUBCUTANEOUS | Status: DC
Start: 2018-09-14 — End: 2018-09-16

## 2018-09-16 MED ORDER — PANCRELIPASE (LIP-PROT-AMYL) 12000-38000 UNITS PO CPEP
12000.00 | ORAL_CAPSULE | ORAL | Status: DC
Start: 2018-09-14 — End: 2018-09-16

## 2018-09-16 MED ORDER — PROMETHAZINE HCL 25 MG/ML IJ SOLN
12.5000 mg | Freq: Once | INTRAMUSCULAR | Status: AC
  Administered 2018-09-16: 12.5 mg via INTRAMUSCULAR
  Filled 2018-09-16: qty 1

## 2018-09-16 MED ORDER — ONDANSETRON HCL 8 MG PO TABS
8.00 | ORAL_TABLET | ORAL | Status: DC
Start: ? — End: 2018-09-16

## 2018-09-16 NOTE — ED Triage Notes (Signed)
Pt with c/o RA pain and swelling.

## 2018-09-16 NOTE — ED Notes (Signed)
Patient verbalizes understanding of discharge instructions. Opportunity for questioning and answers were provided. Armband removed by staff, pt discharged from ED.
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
## 2018-09-17 ENCOUNTER — Emergency Department (HOSPITAL_COMMUNITY): Payer: Medicare Other

## 2018-09-17 ENCOUNTER — Emergency Department (HOSPITAL_COMMUNITY)
Admission: EM | Admit: 2018-09-17 | Discharge: 2018-09-17 | Disposition: A | Discharge status: Home / Self Care | Payer: Medicare Other | Attending: Emergency Medicine | Admitting: Emergency Medicine

## 2018-09-17 ENCOUNTER — Other Ambulatory Visit: Payer: Self-pay

## 2018-09-17 DIAGNOSIS — I5042 Chronic combined systolic (congestive) and diastolic (congestive) heart failure: Secondary | ICD-10-CM | POA: Diagnosis not present

## 2018-09-17 DIAGNOSIS — D509 Iron deficiency anemia, unspecified: Secondary | ICD-10-CM | POA: Diagnosis not present

## 2018-09-17 DIAGNOSIS — Z87891 Personal history of nicotine dependence: Secondary | ICD-10-CM | POA: Diagnosis not present

## 2018-09-17 DIAGNOSIS — R112 Nausea with vomiting, unspecified: Secondary | ICD-10-CM | POA: Diagnosis not present

## 2018-09-17 DIAGNOSIS — R0789 Other chest pain: Secondary | ICD-10-CM | POA: Diagnosis not present

## 2018-09-17 DIAGNOSIS — M792 Neuralgia and neuritis, unspecified: Secondary | ICD-10-CM | POA: Insufficient documentation

## 2018-09-17 DIAGNOSIS — Z7901 Long term (current) use of anticoagulants: Secondary | ICD-10-CM | POA: Insufficient documentation

## 2018-09-17 DIAGNOSIS — N2581 Secondary hyperparathyroidism of renal origin: Secondary | ICD-10-CM | POA: Diagnosis not present

## 2018-09-17 DIAGNOSIS — R0602 Shortness of breath: Secondary | ICD-10-CM | POA: Diagnosis not present

## 2018-09-17 DIAGNOSIS — E875 Hyperkalemia: Secondary | ICD-10-CM | POA: Diagnosis not present

## 2018-09-17 DIAGNOSIS — R079 Chest pain, unspecified: Secondary | ICD-10-CM | POA: Diagnosis not present

## 2018-09-17 DIAGNOSIS — Z79899 Other long term (current) drug therapy: Secondary | ICD-10-CM | POA: Insufficient documentation

## 2018-09-17 DIAGNOSIS — M79603 Pain in arm, unspecified: Secondary | ICD-10-CM | POA: Diagnosis not present

## 2018-09-17 DIAGNOSIS — E1122 Type 2 diabetes mellitus with diabetic chronic kidney disease: Secondary | ICD-10-CM | POA: Diagnosis not present

## 2018-09-17 DIAGNOSIS — R52 Pain, unspecified: Secondary | ICD-10-CM | POA: Diagnosis not present

## 2018-09-17 DIAGNOSIS — N186 End stage renal disease: Secondary | ICD-10-CM | POA: Diagnosis not present

## 2018-09-17 DIAGNOSIS — X58XXXS Exposure to other specified factors, sequela: Secondary | ICD-10-CM | POA: Diagnosis not present

## 2018-09-17 DIAGNOSIS — R2241 Localized swelling, mass and lump, right lower limb: Secondary | ICD-10-CM | POA: Diagnosis not present

## 2018-09-17 DIAGNOSIS — R0902 Hypoxemia: Secondary | ICD-10-CM | POA: Diagnosis not present

## 2018-09-17 DIAGNOSIS — S41101S Unspecified open wound of right upper arm, sequela: Secondary | ICD-10-CM | POA: Diagnosis not present

## 2018-09-17 DIAGNOSIS — Z992 Dependence on renal dialysis: Secondary | ICD-10-CM | POA: Diagnosis not present

## 2018-09-17 DIAGNOSIS — I82621 Acute embolism and thrombosis of deep veins of right upper extremity: Secondary | ICD-10-CM | POA: Diagnosis not present

## 2018-09-17 DIAGNOSIS — I132 Hypertensive heart and chronic kidney disease with heart failure and with stage 5 chronic kidney disease, or end stage renal disease: Secondary | ICD-10-CM | POA: Insufficient documentation

## 2018-09-17 DIAGNOSIS — D689 Coagulation defect, unspecified: Secondary | ICD-10-CM | POA: Diagnosis not present

## 2018-09-17 DIAGNOSIS — I213 ST elevation (STEMI) myocardial infarction of unspecified site: Secondary | ICD-10-CM | POA: Diagnosis not present

## 2018-09-17 LAB — LIPASE, BLOOD: Lipase: 33 U/L (ref 11–51)

## 2018-09-17 LAB — CBC WITH DIFFERENTIAL/PLATELET
Abs Immature Granulocytes: 0.02 10*3/uL (ref 0.00–0.07)
Basophils Absolute: 0 10*3/uL (ref 0.0–0.1)
Basophils Relative: 1 %
Eosinophils Absolute: 0.3 10*3/uL (ref 0.0–0.5)
Eosinophils Relative: 8 %
HCT: 26.7 % — ABNORMAL LOW (ref 39.0–52.0)
Hemoglobin: 8.3 g/dL — ABNORMAL LOW (ref 13.0–17.0)
Immature Granulocytes: 1 %
LYMPHS ABS: 0.5 10*3/uL — AB (ref 0.7–4.0)
Lymphocytes Relative: 11 %
MCH: 25.7 pg — ABNORMAL LOW (ref 26.0–34.0)
MCHC: 31.1 g/dL (ref 30.0–36.0)
MCV: 82.7 fL (ref 80.0–100.0)
Monocytes Absolute: 0.6 10*3/uL (ref 0.1–1.0)
Monocytes Relative: 13 %
NEUTROS PCT: 66 %
Neutro Abs: 3 10*3/uL (ref 1.7–7.7)
Platelets: 171 10*3/uL (ref 150–400)
RBC: 3.23 MIL/uL — ABNORMAL LOW (ref 4.22–5.81)
RDW: 20.1 % — ABNORMAL HIGH (ref 11.5–15.5)
WBC: 4.4 10*3/uL (ref 4.0–10.5)
nRBC: 0 % (ref 0.0–0.2)

## 2018-09-17 LAB — COMPREHENSIVE METABOLIC PANEL
ALT: 14 U/L (ref 0–44)
AST: 24 U/L (ref 15–41)
Albumin: 2.8 g/dL — ABNORMAL LOW (ref 3.5–5.0)
Alkaline Phosphatase: 96 U/L (ref 38–126)
Anion gap: 14 (ref 5–15)
BUN: 65 mg/dL — AB (ref 6–20)
CO2: 24 mmol/L (ref 22–32)
Calcium: 9.4 mg/dL (ref 8.9–10.3)
Chloride: 97 mmol/L — ABNORMAL LOW (ref 98–111)
Creatinine, Ser: 11.09 mg/dL — ABNORMAL HIGH (ref 0.61–1.24)
GFR calc Af Amer: 5 mL/min — ABNORMAL LOW (ref >60)
GFR calc non Af Amer: 5 mL/min — ABNORMAL LOW (ref >60)
Glucose, Bld: 74 mg/dL (ref 70–99)
POTASSIUM: 6 mmol/L — AB (ref 3.5–5.1)
Sodium: 135 mmol/L (ref 135–145)
Total Bilirubin: 1.4 mg/dL — ABNORMAL HIGH (ref 0.3–1.2)
Total Protein: 8.9 g/dL — ABNORMAL HIGH (ref 6.5–8.1)

## 2018-09-17 MED ORDER — SODIUM ZIRCONIUM CYCLOSILICATE 10 G PO PACK
10.0000 g | PACK | Freq: Once | ORAL | Status: DC
  Filled 2018-09-17: qty 1

## 2018-09-17 MED ORDER — SODIUM CHLORIDE 0.9 % IV BOLUS: 500.0000 mL | Freq: Once | INTRAVENOUS | Status: DC

## 2018-09-17 MED ORDER — SODIUM CHLORIDE 0.9 % IV BOLUS
500.0000 mL | Freq: Once | INTRAVENOUS | Status: AC
  Administered 2018-09-17: 500 mL via INTRAVENOUS

## 2018-09-17 MED ORDER — ONDANSETRON 4 MG PO TBDP: 4.0000 mg | ORAL_TABLET | Freq: Three times a day (TID) | ORAL | 0 refills | Status: AC | PRN

## 2018-09-17 MED ORDER — LINEZOLID 600 MG PO TABS
600.0000 mg | ORAL_TABLET | Freq: Once | ORAL | Status: AC
  Administered 2018-09-17: 600 mg via ORAL
  Filled 2018-09-17: qty 1

## 2018-09-17 MED ORDER — GABAPENTIN 300 MG PO CAPS
300.0000 mg | ORAL_CAPSULE | Freq: Once | ORAL | Status: DC
  Filled 2018-09-17: qty 1

## 2018-09-17 MED ORDER — OXYCODONE HCL 5 MG PO TABS
10.0000 mg | ORAL_TABLET | Freq: Once | ORAL | Status: AC
  Administered 2018-09-17: 10 mg via ORAL
  Filled 2018-09-17 (×2): qty 2

## 2018-09-17 MED ORDER — FENTANYL CITRATE (PF) 100 MCG/2ML IJ SOLN
50.0000 ug | Freq: Once | INTRAMUSCULAR | Status: AC
  Administered 2018-09-17: 50 ug via INTRAVENOUS
  Filled 2018-09-17: qty 2

## 2018-09-17 MED ORDER — ONDANSETRON HCL 4 MG/2ML IJ SOLN: 4.0000 mg | Freq: Once | INTRAMUSCULAR | Status: DC

## 2018-09-17 MED ORDER — ONDANSETRON 4 MG PO TBDP: 4.0000 mg | ORAL_TABLET | Freq: Once | ORAL | Status: DC

## 2018-09-17 MED ORDER — APIXABAN 5 MG PO TABS
5.0000 mg | ORAL_TABLET | Freq: Once | ORAL | Status: AC
  Administered 2018-09-17: 5 mg via ORAL
  Filled 2018-09-17: qty 1

## 2018-09-17 MED ORDER — ONDANSETRON HCL 4 MG/2ML IJ SOLN
4.0000 mg | Freq: Once | INTRAMUSCULAR | Status: AC
  Administered 2018-09-17: 4 mg via INTRAVENOUS
  Filled 2018-09-17: qty 2

## 2018-09-17 MED ORDER — HYDRALAZINE HCL 25 MG PO TABS
100.0000 mg | ORAL_TABLET | Freq: Once | ORAL | Status: AC
  Administered 2018-09-17: 100 mg via ORAL
  Filled 2018-09-17: qty 4

## 2018-09-17 NOTE — ED Notes (Signed)
Patient transported to X-ray 
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
## 2018-09-17 NOTE — ED Notes (Signed)
Patient refused IV.

## 2018-09-17 NOTE — ED Notes (Signed)
Pt refusing care from techs. Pt also refusing to get into the bed so that he can be hooked up

## 2018-09-17 NOTE — Discharge Instructions (Addendum)
Please take your home medication as prescribed.  Go to your dialysis session today at noon.

## 2018-09-17 NOTE — ED Triage Notes (Signed)
Pt c/o pain to the site of his chest tube from 2 weeks agol.  MWF dialysis.  Pt multiple medical issues, pt seen earlier today or same.

## 2018-09-17 NOTE — ED Notes (Signed)
Patient verbalizes understanding of discharge instructions. Opportunity for questioning and answers were provided. Armband removed by staff, pt discharged from ED. Patient ambulated out of the facility

## 2018-09-20 DIAGNOSIS — R531 Weakness: Secondary | ICD-10-CM | POA: Diagnosis not present

## 2018-09-20 DIAGNOSIS — M5442 Lumbago with sciatica, left side: Secondary | ICD-10-CM | POA: Diagnosis present

## 2018-09-20 DIAGNOSIS — K5903 Drug induced constipation: Secondary | ICD-10-CM | POA: Diagnosis present

## 2018-09-20 DIAGNOSIS — Z9119 Patient's noncompliance with other medical treatment and regimen: Secondary | ICD-10-CM | POA: Diagnosis not present

## 2018-09-20 DIAGNOSIS — J939 Pneumothorax, unspecified: Secondary | ICD-10-CM | POA: Diagnosis not present

## 2018-09-20 DIAGNOSIS — N2581 Secondary hyperparathyroidism of renal origin: Secondary | ICD-10-CM | POA: Diagnosis present

## 2018-09-20 DIAGNOSIS — R2231 Localized swelling, mass and lump, right upper limb: Secondary | ICD-10-CM | POA: Diagnosis not present

## 2018-09-20 DIAGNOSIS — N3289 Other specified disorders of bladder: Secondary | ICD-10-CM | POA: Diagnosis not present

## 2018-09-20 DIAGNOSIS — R109 Unspecified abdominal pain: Secondary | ICD-10-CM | POA: Diagnosis not present

## 2018-09-20 DIAGNOSIS — I82621 Acute embolism and thrombosis of deep veins of right upper extremity: Secondary | ICD-10-CM | POA: Diagnosis not present

## 2018-09-20 DIAGNOSIS — Z888 Allergy status to other drugs, medicaments and biological substances status: Secondary | ICD-10-CM | POA: Diagnosis not present

## 2018-09-20 DIAGNOSIS — I742 Embolism and thrombosis of arteries of the upper extremities: Secondary | ICD-10-CM | POA: Diagnosis not present

## 2018-09-20 DIAGNOSIS — I5042 Chronic combined systolic (congestive) and diastolic (congestive) heart failure: Secondary | ICD-10-CM | POA: Diagnosis present

## 2018-09-20 DIAGNOSIS — I313 Pericardial effusion (noninflammatory): Secondary | ICD-10-CM | POA: Diagnosis not present

## 2018-09-20 DIAGNOSIS — D631 Anemia in chronic kidney disease: Secondary | ICD-10-CM | POA: Diagnosis not present

## 2018-09-20 DIAGNOSIS — N2889 Other specified disorders of kidney and ureter: Secondary | ICD-10-CM | POA: Diagnosis not present

## 2018-09-20 DIAGNOSIS — M79601 Pain in right arm: Secondary | ICD-10-CM | POA: Diagnosis not present

## 2018-09-20 DIAGNOSIS — K3189 Other diseases of stomach and duodenum: Secondary | ICD-10-CM | POA: Diagnosis present

## 2018-09-20 DIAGNOSIS — M5441 Lumbago with sciatica, right side: Secondary | ICD-10-CM | POA: Diagnosis present

## 2018-09-20 DIAGNOSIS — T400X5A Adverse effect of opium, initial encounter: Secondary | ICD-10-CM | POA: Diagnosis present

## 2018-09-20 DIAGNOSIS — J948 Other specified pleural conditions: Secondary | ICD-10-CM | POA: Diagnosis not present

## 2018-09-20 DIAGNOSIS — E1122 Type 2 diabetes mellitus with diabetic chronic kidney disease: Secondary | ICD-10-CM | POA: Diagnosis present

## 2018-09-20 DIAGNOSIS — M5440 Lumbago with sciatica, unspecified side: Secondary | ICD-10-CM | POA: Diagnosis not present

## 2018-09-20 DIAGNOSIS — Z86718 Personal history of other venous thrombosis and embolism: Secondary | ICD-10-CM | POA: Diagnosis not present

## 2018-09-20 DIAGNOSIS — Z794 Long term (current) use of insulin: Secondary | ICD-10-CM | POA: Diagnosis not present

## 2018-09-20 DIAGNOSIS — I132 Hypertensive heart and chronic kidney disease with heart failure and with stage 5 chronic kidney disease, or end stage renal disease: Secondary | ICD-10-CM | POA: Diagnosis present

## 2018-09-20 DIAGNOSIS — L03113 Cellulitis of right upper limb: Secondary | ICD-10-CM | POA: Diagnosis not present

## 2018-09-20 DIAGNOSIS — I120 Hypertensive chronic kidney disease with stage 5 chronic kidney disease or end stage renal disease: Secondary | ICD-10-CM | POA: Diagnosis not present

## 2018-09-20 DIAGNOSIS — Z79899 Other long term (current) drug therapy: Secondary | ICD-10-CM | POA: Diagnosis not present

## 2018-09-20 DIAGNOSIS — G4733 Obstructive sleep apnea (adult) (pediatric): Secondary | ICD-10-CM | POA: Diagnosis present

## 2018-09-20 DIAGNOSIS — N186 End stage renal disease: Secondary | ICD-10-CM | POA: Diagnosis not present

## 2018-09-20 DIAGNOSIS — I517 Cardiomegaly: Secondary | ICD-10-CM | POA: Diagnosis not present

## 2018-09-20 DIAGNOSIS — R188 Other ascites: Secondary | ICD-10-CM | POA: Diagnosis not present

## 2018-09-20 DIAGNOSIS — Z992 Dependence on renal dialysis: Secondary | ICD-10-CM | POA: Diagnosis not present

## 2018-09-20 DIAGNOSIS — K861 Other chronic pancreatitis: Secondary | ICD-10-CM | POA: Diagnosis present

## 2018-09-20 DIAGNOSIS — E11649 Type 2 diabetes mellitus with hypoglycemia without coma: Secondary | ICD-10-CM | POA: Diagnosis not present

## 2018-09-21 DIAGNOSIS — Z992 Dependence on renal dialysis: Secondary | ICD-10-CM | POA: Diagnosis not present

## 2018-09-21 DIAGNOSIS — Z86718 Personal history of other venous thrombosis and embolism: Secondary | ICD-10-CM | POA: Diagnosis not present

## 2018-09-21 DIAGNOSIS — N186 End stage renal disease: Secondary | ICD-10-CM | POA: Diagnosis not present

## 2018-09-21 DIAGNOSIS — I82621 Acute embolism and thrombosis of deep veins of right upper extremity: Secondary | ICD-10-CM | POA: Diagnosis not present

## 2018-09-21 DIAGNOSIS — D631 Anemia in chronic kidney disease: Secondary | ICD-10-CM | POA: Diagnosis not present

## 2018-09-21 DIAGNOSIS — J948 Other specified pleural conditions: Secondary | ICD-10-CM | POA: Diagnosis not present

## 2018-09-21 DIAGNOSIS — I5042 Chronic combined systolic (congestive) and diastolic (congestive) heart failure: Secondary | ICD-10-CM | POA: Diagnosis not present

## 2018-09-21 DIAGNOSIS — Z9119 Patient's noncompliance with other medical treatment and regimen: Secondary | ICD-10-CM | POA: Diagnosis not present

## 2018-09-21 DIAGNOSIS — L03113 Cellulitis of right upper limb: Secondary | ICD-10-CM | POA: Diagnosis not present

## 2018-09-21 DIAGNOSIS — I132 Hypertensive heart and chronic kidney disease with heart failure and with stage 5 chronic kidney disease, or end stage renal disease: Secondary | ICD-10-CM | POA: Diagnosis not present

## 2018-09-21 DIAGNOSIS — I313 Pericardial effusion (noninflammatory): Secondary | ICD-10-CM | POA: Diagnosis not present

## 2018-09-22 DIAGNOSIS — D631 Anemia in chronic kidney disease: Secondary | ICD-10-CM | POA: Diagnosis not present

## 2018-09-22 DIAGNOSIS — I313 Pericardial effusion (noninflammatory): Secondary | ICD-10-CM | POA: Diagnosis not present

## 2018-09-22 DIAGNOSIS — L03113 Cellulitis of right upper limb: Secondary | ICD-10-CM | POA: Diagnosis not present

## 2018-09-22 DIAGNOSIS — Z9119 Patient's noncompliance with other medical treatment and regimen: Secondary | ICD-10-CM | POA: Diagnosis not present

## 2018-09-22 DIAGNOSIS — J948 Other specified pleural conditions: Secondary | ICD-10-CM | POA: Diagnosis not present

## 2018-09-22 DIAGNOSIS — Z86718 Personal history of other venous thrombosis and embolism: Secondary | ICD-10-CM | POA: Diagnosis not present

## 2018-09-22 DIAGNOSIS — Z992 Dependence on renal dialysis: Secondary | ICD-10-CM | POA: Diagnosis not present

## 2018-09-22 DIAGNOSIS — I132 Hypertensive heart and chronic kidney disease with heart failure and with stage 5 chronic kidney disease, or end stage renal disease: Secondary | ICD-10-CM | POA: Diagnosis not present

## 2018-09-22 DIAGNOSIS — N186 End stage renal disease: Secondary | ICD-10-CM | POA: Diagnosis not present

## 2018-09-22 DIAGNOSIS — I82621 Acute embolism and thrombosis of deep veins of right upper extremity: Secondary | ICD-10-CM | POA: Diagnosis not present

## 2018-09-22 DIAGNOSIS — I5042 Chronic combined systolic (congestive) and diastolic (congestive) heart failure: Secondary | ICD-10-CM | POA: Diagnosis not present

## 2018-09-23 DIAGNOSIS — N186 End stage renal disease: Secondary | ICD-10-CM | POA: Diagnosis not present

## 2018-09-23 DIAGNOSIS — Z9119 Patient's noncompliance with other medical treatment and regimen: Secondary | ICD-10-CM | POA: Diagnosis not present

## 2018-09-23 DIAGNOSIS — I5042 Chronic combined systolic (congestive) and diastolic (congestive) heart failure: Secondary | ICD-10-CM | POA: Diagnosis not present

## 2018-09-23 DIAGNOSIS — Z992 Dependence on renal dialysis: Secondary | ICD-10-CM | POA: Diagnosis not present

## 2018-09-23 DIAGNOSIS — I313 Pericardial effusion (noninflammatory): Secondary | ICD-10-CM | POA: Diagnosis not present

## 2018-09-23 DIAGNOSIS — J948 Other specified pleural conditions: Secondary | ICD-10-CM | POA: Diagnosis not present

## 2018-09-23 DIAGNOSIS — I82621 Acute embolism and thrombosis of deep veins of right upper extremity: Secondary | ICD-10-CM | POA: Diagnosis not present

## 2018-09-23 DIAGNOSIS — I132 Hypertensive heart and chronic kidney disease with heart failure and with stage 5 chronic kidney disease, or end stage renal disease: Secondary | ICD-10-CM | POA: Diagnosis not present

## 2018-09-23 DIAGNOSIS — L03113 Cellulitis of right upper limb: Secondary | ICD-10-CM | POA: Diagnosis not present

## 2018-09-23 DIAGNOSIS — D631 Anemia in chronic kidney disease: Secondary | ICD-10-CM | POA: Diagnosis not present

## 2018-09-24 DIAGNOSIS — I132 Hypertensive heart and chronic kidney disease with heart failure and with stage 5 chronic kidney disease, or end stage renal disease: Secondary | ICD-10-CM | POA: Diagnosis not present

## 2018-09-24 DIAGNOSIS — L03113 Cellulitis of right upper limb: Secondary | ICD-10-CM | POA: Diagnosis not present

## 2018-09-24 DIAGNOSIS — N186 End stage renal disease: Secondary | ICD-10-CM | POA: Diagnosis not present

## 2018-09-24 DIAGNOSIS — D631 Anemia in chronic kidney disease: Secondary | ICD-10-CM | POA: Diagnosis not present

## 2018-09-24 DIAGNOSIS — Z86718 Personal history of other venous thrombosis and embolism: Secondary | ICD-10-CM | POA: Diagnosis not present

## 2018-09-24 DIAGNOSIS — Z9119 Patient's noncompliance with other medical treatment and regimen: Secondary | ICD-10-CM | POA: Diagnosis not present

## 2018-09-24 DIAGNOSIS — J948 Other specified pleural conditions: Secondary | ICD-10-CM | POA: Diagnosis not present

## 2018-09-24 DIAGNOSIS — I313 Pericardial effusion (noninflammatory): Secondary | ICD-10-CM | POA: Diagnosis not present

## 2018-09-24 DIAGNOSIS — I82621 Acute embolism and thrombosis of deep veins of right upper extremity: Secondary | ICD-10-CM | POA: Diagnosis not present

## 2018-09-24 DIAGNOSIS — Z992 Dependence on renal dialysis: Secondary | ICD-10-CM | POA: Diagnosis not present

## 2018-09-24 DIAGNOSIS — I5042 Chronic combined systolic (congestive) and diastolic (congestive) heart failure: Secondary | ICD-10-CM | POA: Diagnosis not present

## 2018-09-25 DIAGNOSIS — Z86718 Personal history of other venous thrombosis and embolism: Secondary | ICD-10-CM | POA: Diagnosis not present

## 2018-09-25 DIAGNOSIS — I82621 Acute embolism and thrombosis of deep veins of right upper extremity: Secondary | ICD-10-CM | POA: Diagnosis not present

## 2018-09-25 DIAGNOSIS — I132 Hypertensive heart and chronic kidney disease with heart failure and with stage 5 chronic kidney disease, or end stage renal disease: Secondary | ICD-10-CM | POA: Diagnosis not present

## 2018-09-25 DIAGNOSIS — I313 Pericardial effusion (noninflammatory): Secondary | ICD-10-CM | POA: Diagnosis not present

## 2018-09-25 DIAGNOSIS — D631 Anemia in chronic kidney disease: Secondary | ICD-10-CM | POA: Diagnosis not present

## 2018-09-25 DIAGNOSIS — I5042 Chronic combined systolic (congestive) and diastolic (congestive) heart failure: Secondary | ICD-10-CM | POA: Diagnosis not present

## 2018-09-25 DIAGNOSIS — Z9119 Patient's noncompliance with other medical treatment and regimen: Secondary | ICD-10-CM | POA: Diagnosis not present

## 2018-09-25 DIAGNOSIS — Z992 Dependence on renal dialysis: Secondary | ICD-10-CM | POA: Diagnosis not present

## 2018-09-25 DIAGNOSIS — J948 Other specified pleural conditions: Secondary | ICD-10-CM | POA: Diagnosis not present

## 2018-09-25 DIAGNOSIS — N186 End stage renal disease: Secondary | ICD-10-CM | POA: Diagnosis not present

## 2018-09-25 DIAGNOSIS — L03113 Cellulitis of right upper limb: Secondary | ICD-10-CM | POA: Diagnosis not present

## 2018-09-26 DIAGNOSIS — Z992 Dependence on renal dialysis: Secondary | ICD-10-CM | POA: Diagnosis not present

## 2018-09-26 DIAGNOSIS — L03113 Cellulitis of right upper limb: Secondary | ICD-10-CM | POA: Diagnosis not present

## 2018-09-26 DIAGNOSIS — I313 Pericardial effusion (noninflammatory): Secondary | ICD-10-CM | POA: Diagnosis not present

## 2018-09-26 DIAGNOSIS — I5042 Chronic combined systolic (congestive) and diastolic (congestive) heart failure: Secondary | ICD-10-CM | POA: Diagnosis not present

## 2018-09-26 DIAGNOSIS — I132 Hypertensive heart and chronic kidney disease with heart failure and with stage 5 chronic kidney disease, or end stage renal disease: Secondary | ICD-10-CM | POA: Diagnosis not present

## 2018-09-26 DIAGNOSIS — Z9119 Patient's noncompliance with other medical treatment and regimen: Secondary | ICD-10-CM | POA: Diagnosis not present

## 2018-09-26 DIAGNOSIS — I82621 Acute embolism and thrombosis of deep veins of right upper extremity: Secondary | ICD-10-CM | POA: Diagnosis not present

## 2018-09-26 DIAGNOSIS — J948 Other specified pleural conditions: Secondary | ICD-10-CM | POA: Diagnosis not present

## 2018-09-26 DIAGNOSIS — N186 End stage renal disease: Secondary | ICD-10-CM | POA: Diagnosis not present

## 2018-09-27 DIAGNOSIS — J948 Other specified pleural conditions: Secondary | ICD-10-CM | POA: Diagnosis not present

## 2018-09-27 DIAGNOSIS — N186 End stage renal disease: Secondary | ICD-10-CM | POA: Diagnosis not present

## 2018-09-27 DIAGNOSIS — Z992 Dependence on renal dialysis: Secondary | ICD-10-CM | POA: Diagnosis not present

## 2018-09-27 DIAGNOSIS — I313 Pericardial effusion (noninflammatory): Secondary | ICD-10-CM | POA: Diagnosis not present

## 2018-09-27 DIAGNOSIS — I82621 Acute embolism and thrombosis of deep veins of right upper extremity: Secondary | ICD-10-CM | POA: Diagnosis not present

## 2018-09-27 DIAGNOSIS — L03113 Cellulitis of right upper limb: Secondary | ICD-10-CM | POA: Diagnosis not present

## 2018-09-27 DIAGNOSIS — I132 Hypertensive heart and chronic kidney disease with heart failure and with stage 5 chronic kidney disease, or end stage renal disease: Secondary | ICD-10-CM | POA: Diagnosis not present

## 2018-09-27 DIAGNOSIS — Z9119 Patient's noncompliance with other medical treatment and regimen: Secondary | ICD-10-CM | POA: Diagnosis not present

## 2018-09-27 DIAGNOSIS — I5042 Chronic combined systolic (congestive) and diastolic (congestive) heart failure: Secondary | ICD-10-CM | POA: Diagnosis not present

## 2018-09-28 DIAGNOSIS — I313 Pericardial effusion (noninflammatory): Secondary | ICD-10-CM | POA: Diagnosis not present

## 2018-09-28 DIAGNOSIS — M5440 Lumbago with sciatica, unspecified side: Secondary | ICD-10-CM | POA: Diagnosis not present

## 2018-09-28 DIAGNOSIS — I5042 Chronic combined systolic (congestive) and diastolic (congestive) heart failure: Secondary | ICD-10-CM | POA: Diagnosis not present

## 2018-09-28 DIAGNOSIS — I82621 Acute embolism and thrombosis of deep veins of right upper extremity: Secondary | ICD-10-CM | POA: Diagnosis not present

## 2018-09-28 DIAGNOSIS — Z9119 Patient's noncompliance with other medical treatment and regimen: Secondary | ICD-10-CM | POA: Diagnosis not present

## 2018-09-28 DIAGNOSIS — Z992 Dependence on renal dialysis: Secondary | ICD-10-CM | POA: Diagnosis not present

## 2018-09-28 DIAGNOSIS — J948 Other specified pleural conditions: Secondary | ICD-10-CM | POA: Diagnosis not present

## 2018-09-28 DIAGNOSIS — I132 Hypertensive heart and chronic kidney disease with heart failure and with stage 5 chronic kidney disease, or end stage renal disease: Secondary | ICD-10-CM | POA: Diagnosis not present

## 2018-09-28 DIAGNOSIS — N186 End stage renal disease: Secondary | ICD-10-CM | POA: Diagnosis not present

## 2018-09-28 DIAGNOSIS — L03113 Cellulitis of right upper limb: Secondary | ICD-10-CM | POA: Diagnosis not present

## 2018-09-29 ENCOUNTER — Encounter (HOSPITAL_COMMUNITY): Payer: Self-pay

## 2018-09-29 ENCOUNTER — Emergency Department (HOSPITAL_COMMUNITY)
Admission: EM | Admit: 2018-09-29 | Discharge: 2018-09-29 | Disposition: A | Discharge status: Home / Self Care | Payer: Medicare Other | Attending: Emergency Medicine | Admitting: Emergency Medicine

## 2018-09-29 DIAGNOSIS — N186 End stage renal disease: Secondary | ICD-10-CM | POA: Insufficient documentation

## 2018-09-29 DIAGNOSIS — I132 Hypertensive heart and chronic kidney disease with heart failure and with stage 5 chronic kidney disease, or end stage renal disease: Secondary | ICD-10-CM | POA: Diagnosis not present

## 2018-09-29 DIAGNOSIS — L03113 Cellulitis of right upper limb: Secondary | ICD-10-CM | POA: Diagnosis not present

## 2018-09-29 DIAGNOSIS — N2889 Other specified disorders of kidney and ureter: Secondary | ICD-10-CM | POA: Diagnosis not present

## 2018-09-29 DIAGNOSIS — Z992 Dependence on renal dialysis: Secondary | ICD-10-CM | POA: Insufficient documentation

## 2018-09-29 DIAGNOSIS — N261 Atrophy of kidney (terminal): Secondary | ICD-10-CM | POA: Diagnosis not present

## 2018-09-29 DIAGNOSIS — E1122 Type 2 diabetes mellitus with diabetic chronic kidney disease: Secondary | ICD-10-CM | POA: Insufficient documentation

## 2018-09-29 DIAGNOSIS — Z9114 Patient's other noncompliance with medication regimen: Secondary | ICD-10-CM | POA: Diagnosis not present

## 2018-09-29 DIAGNOSIS — Z87891 Personal history of nicotine dependence: Secondary | ICD-10-CM | POA: Insufficient documentation

## 2018-09-29 DIAGNOSIS — Z7901 Long term (current) use of anticoagulants: Secondary | ICD-10-CM | POA: Diagnosis not present

## 2018-09-29 DIAGNOSIS — J948 Other specified pleural conditions: Secondary | ICD-10-CM | POA: Diagnosis not present

## 2018-09-29 DIAGNOSIS — K6389 Other specified diseases of intestine: Secondary | ICD-10-CM | POA: Diagnosis not present

## 2018-09-29 DIAGNOSIS — Z794 Long term (current) use of insulin: Secondary | ICD-10-CM | POA: Diagnosis not present

## 2018-09-29 DIAGNOSIS — Z79899 Other long term (current) drug therapy: Secondary | ICD-10-CM | POA: Diagnosis not present

## 2018-09-29 DIAGNOSIS — M7989 Other specified soft tissue disorders: Secondary | ICD-10-CM | POA: Diagnosis not present

## 2018-09-29 DIAGNOSIS — I120 Hypertensive chronic kidney disease with stage 5 chronic kidney disease or end stage renal disease: Secondary | ICD-10-CM | POA: Diagnosis not present

## 2018-09-29 DIAGNOSIS — I5042 Chronic combined systolic (congestive) and diastolic (congestive) heart failure: Secondary | ICD-10-CM | POA: Insufficient documentation

## 2018-09-29 DIAGNOSIS — R112 Nausea with vomiting, unspecified: Secondary | ICD-10-CM

## 2018-09-29 DIAGNOSIS — J939 Pneumothorax, unspecified: Secondary | ICD-10-CM | POA: Diagnosis not present

## 2018-09-29 DIAGNOSIS — R0602 Shortness of breath: Secondary | ICD-10-CM | POA: Diagnosis not present

## 2018-09-29 DIAGNOSIS — Z86718 Personal history of other venous thrombosis and embolism: Secondary | ICD-10-CM | POA: Diagnosis not present

## 2018-09-29 MED ORDER — OXYCODONE HCL 15 MG PO TABS
15.00 | ORAL_TABLET | ORAL | Status: DC
Start: ? — End: 2018-09-29

## 2018-09-29 MED ORDER — PROMETHAZINE HCL 25 MG PO TABS: 25.0000 mg | ORAL_TABLET | Freq: Four times a day (QID) | ORAL | 0 refills | Status: DC | PRN

## 2018-09-29 MED ORDER — PANTOPRAZOLE SODIUM 40 MG PO TBEC
40.00 | DELAYED_RELEASE_TABLET | ORAL | Status: DC
Start: 2018-09-28 — End: 2018-09-29

## 2018-09-29 MED ORDER — PROCHLORPERAZINE EDISYLATE 10 MG/2ML IJ SOLN
10.0000 mg | Freq: Once | INTRAMUSCULAR | Status: AC
  Administered 2018-09-29: 10 mg via INTRAMUSCULAR
  Filled 2018-09-29: qty 2

## 2018-09-29 MED ORDER — CARVEDILOL 25 MG PO TABS
25.00 | ORAL_TABLET | ORAL | Status: DC
Start: 2018-09-28 — End: 2018-09-29

## 2018-09-29 MED ORDER — AMLODIPINE BESYLATE 5 MG PO TABS
5.00 | ORAL_TABLET | ORAL | Status: DC
Start: 2018-09-28 — End: 2018-09-29

## 2018-09-29 MED ORDER — PROCHLORPERAZINE EDISYLATE 10 MG/2ML IJ SOLN: 10.0000 mg | Freq: Once | INTRAMUSCULAR | Status: DC

## 2018-09-29 MED ORDER — PROMETHAZINE HCL 25 MG/ML IJ SOLN: 25.0000 mg | Freq: Once | INTRAMUSCULAR | Status: DC

## 2018-09-29 MED ORDER — APIXABAN 5 MG PO TABS
5.00 | ORAL_TABLET | ORAL | Status: DC
Start: 2018-09-28 — End: 2018-09-29

## 2018-09-29 MED ORDER — GENERIC EXTERNAL MEDICATION
15.00 | Status: DC
Start: ? — End: 2018-09-29

## 2018-09-29 MED ORDER — CINACALCET HCL 30 MG PO TABS
90.00 | ORAL_TABLET | ORAL | Status: DC
Start: 2018-09-28 — End: 2018-09-29

## 2018-09-29 MED ORDER — SENNA-DOCUSATE SODIUM 8.6-50 MG PO TABS
2.00 | ORAL_TABLET | ORAL | Status: DC
Start: 2018-09-28 — End: 2018-09-29

## 2018-09-29 MED ORDER — GENERIC EXTERNAL MEDICATION
5.00 | Status: DC
Start: ? — End: 2018-09-29

## 2018-09-29 MED ORDER — ALBUMIN HUMAN 25 % IV SOLN
12.50 | INTRAVENOUS | Status: DC
Start: ? — End: 2018-09-29

## 2018-09-29 MED ORDER — ONDANSETRON HCL 4 MG/2ML IJ SOLN
4.00 | INTRAMUSCULAR | Status: DC
Start: ? — End: 2018-09-29

## 2018-09-29 MED ORDER — ALTEPLASE 2 MG IJ SOLR
2.00 | INTRAMUSCULAR | Status: DC
Start: ? — End: 2018-09-29

## 2018-09-29 MED ORDER — ONDANSETRON HCL 4 MG/2ML IJ SOLN: 4.0000 mg | Freq: Once | INTRAMUSCULAR | Status: DC

## 2018-09-29 MED ORDER — SODIUM CHLORIDE (PF) 0.9 % IJ SOLN
50.00 | INTRAMUSCULAR | Status: DC
Start: ? — End: 2018-09-29

## 2018-09-29 MED ORDER — DIPHENHYDRAMINE HCL 50 MG/ML IJ SOLN
25.0000 mg | Freq: Once | INTRAMUSCULAR | Status: AC
  Administered 2018-09-29: 25 mg via INTRAMUSCULAR
  Filled 2018-09-29: qty 1

## 2018-09-29 MED ORDER — CEPHALEXIN 500 MG PO CAPS
500.00 | ORAL_CAPSULE | ORAL | Status: DC
Start: 2018-09-28 — End: 2018-09-29

## 2018-09-29 MED ORDER — POLYETHYLENE GLYCOL 3350 17 G PO PACK
17.00 | PACK | ORAL | Status: DC
Start: 2018-09-29 — End: 2018-09-29

## 2018-09-29 MED ORDER — OXYCODONE HCL 5 MG PO TABS
15.0000 mg | ORAL_TABLET | Freq: Once | ORAL | Status: AC
  Administered 2018-09-29: 15 mg via ORAL
  Filled 2018-09-29: qty 3

## 2018-09-29 MED ORDER — DIPHENHYDRAMINE HCL 50 MG/ML IJ SOLN
12.50 | INTRAMUSCULAR | Status: DC
Start: ? — End: 2018-09-29

## 2018-09-29 MED ORDER — HYDRALAZINE HCL 50 MG PO TABS
100.00 | ORAL_TABLET | ORAL | Status: DC
Start: 2018-09-28 — End: 2018-09-29

## 2018-09-29 MED ORDER — HYDRALAZINE HCL 20 MG/ML IJ SOLN
10.00 | INTRAMUSCULAR | Status: DC
Start: ? — End: 2018-09-29

## 2018-09-29 MED ORDER — GENERIC EXTERNAL MEDICATION
4.00 | Status: DC
Start: ? — End: 2018-09-29

## 2018-09-29 MED ORDER — PROMETHAZINE HCL 25 MG/ML IJ SOLN
12.5000 mg | Freq: Once | INTRAMUSCULAR | Status: DC
  Filled 2018-09-29: qty 1

## 2018-09-29 MED ORDER — GENERIC EXTERNAL MEDICATION
25.00 | Status: DC
Start: ? — End: 2018-09-29

## 2018-09-29 MED ORDER — SODIUM CHLORIDE 0.9 % IV SOLN
150.00 | INTRAVENOUS | Status: DC
Start: ? — End: 2018-09-29

## 2018-09-29 NOTE — ED Triage Notes (Signed)
Pt arrived stating he was discharged from the hospital, pt stating he has blood clots in his right arm. Pt stated he was told to keep it elevated, pt stating he couldn't keep his pain medication down. Pt had dialysis today at the hospital, MWF scheduled.
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
## 2018-09-29 NOTE — Discharge Instructions (Signed)
You were seen in the ER for nausea and vomiting.  You were unable to take her medicines due to this.  You Phenergan prescription has been called into the pharmacy.  Take Phenergan as prescribed every 6 hours to prevent nausea.  This pill can also be inserted rectally to help with nausea if your vomiting.  Follow-up with your doctor on Wednesday as scheduled.  Return to the ER for fever greater than 101, blood in your throw up, blood in your stools.

## 2018-09-30 DIAGNOSIS — N2889 Other specified disorders of kidney and ureter: Secondary | ICD-10-CM | POA: Diagnosis not present

## 2018-09-30 DIAGNOSIS — Z9114 Patient's other noncompliance with medication regimen: Secondary | ICD-10-CM | POA: Diagnosis not present

## 2018-09-30 DIAGNOSIS — N261 Atrophy of kidney (terminal): Secondary | ICD-10-CM | POA: Diagnosis not present

## 2018-09-30 DIAGNOSIS — J939 Pneumothorax, unspecified: Secondary | ICD-10-CM | POA: Diagnosis not present

## 2018-09-30 DIAGNOSIS — I313 Pericardial effusion (noninflammatory): Secondary | ICD-10-CM | POA: Diagnosis not present

## 2018-09-30 DIAGNOSIS — I82A11 Acute embolism and thrombosis of right axillary vein: Secondary | ICD-10-CM | POA: Diagnosis not present

## 2018-09-30 DIAGNOSIS — L03113 Cellulitis of right upper limb: Secondary | ICD-10-CM | POA: Diagnosis not present

## 2018-09-30 DIAGNOSIS — I10 Essential (primary) hypertension: Secondary | ICD-10-CM | POA: Diagnosis not present

## 2018-09-30 DIAGNOSIS — R0602 Shortness of breath: Secondary | ICD-10-CM | POA: Diagnosis not present

## 2018-09-30 DIAGNOSIS — K6389 Other specified diseases of intestine: Secondary | ICD-10-CM | POA: Diagnosis not present

## 2018-09-30 DIAGNOSIS — I120 Hypertensive chronic kidney disease with stage 5 chronic kidney disease or end stage renal disease: Secondary | ICD-10-CM | POA: Diagnosis not present

## 2018-09-30 DIAGNOSIS — M7989 Other specified soft tissue disorders: Secondary | ICD-10-CM | POA: Diagnosis not present

## 2018-09-30 DIAGNOSIS — J948 Other specified pleural conditions: Secondary | ICD-10-CM | POA: Diagnosis not present

## 2018-09-30 DIAGNOSIS — I82621 Acute embolism and thrombosis of deep veins of right upper extremity: Secondary | ICD-10-CM | POA: Diagnosis not present

## 2018-09-30 DIAGNOSIS — Z7901 Long term (current) use of anticoagulants: Secondary | ICD-10-CM | POA: Diagnosis not present

## 2018-09-30 DIAGNOSIS — E1122 Type 2 diabetes mellitus with diabetic chronic kidney disease: Secondary | ICD-10-CM | POA: Diagnosis not present

## 2018-09-30 DIAGNOSIS — R112 Nausea with vomiting, unspecified: Secondary | ICD-10-CM | POA: Diagnosis not present

## 2018-09-30 DIAGNOSIS — Z86718 Personal history of other venous thrombosis and embolism: Secondary | ICD-10-CM | POA: Diagnosis not present

## 2018-09-30 DIAGNOSIS — Z794 Long term (current) use of insulin: Secondary | ICD-10-CM | POA: Diagnosis not present

## 2018-09-30 DIAGNOSIS — N186 End stage renal disease: Secondary | ICD-10-CM | POA: Diagnosis not present

## 2018-09-30 DIAGNOSIS — Z992 Dependence on renal dialysis: Secondary | ICD-10-CM | POA: Diagnosis not present

## 2018-09-30 DIAGNOSIS — Z9119 Patient's noncompliance with other medical treatment and regimen: Secondary | ICD-10-CM | POA: Diagnosis not present

## 2018-10-01 DIAGNOSIS — I10 Essential (primary) hypertension: Secondary | ICD-10-CM | POA: Diagnosis not present

## 2018-10-01 DIAGNOSIS — I82A11 Acute embolism and thrombosis of right axillary vein: Secondary | ICD-10-CM | POA: Diagnosis not present

## 2018-10-01 DIAGNOSIS — R112 Nausea with vomiting, unspecified: Secondary | ICD-10-CM | POA: Diagnosis not present

## 2018-10-01 DIAGNOSIS — N186 End stage renal disease: Secondary | ICD-10-CM | POA: Diagnosis not present

## 2018-10-01 DIAGNOSIS — Z992 Dependence on renal dialysis: Secondary | ICD-10-CM | POA: Diagnosis not present

## 2018-10-01 DIAGNOSIS — K861 Other chronic pancreatitis: Secondary | ICD-10-CM | POA: Diagnosis not present

## 2018-10-01 DIAGNOSIS — Z9119 Patient's noncompliance with other medical treatment and regimen: Secondary | ICD-10-CM | POA: Diagnosis not present

## 2018-10-01 DIAGNOSIS — I313 Pericardial effusion (noninflammatory): Secondary | ICD-10-CM | POA: Diagnosis not present

## 2018-10-01 DIAGNOSIS — L03113 Cellulitis of right upper limb: Secondary | ICD-10-CM | POA: Diagnosis not present

## 2018-10-02 DIAGNOSIS — Z992 Dependence on renal dialysis: Secondary | ICD-10-CM | POA: Diagnosis not present

## 2018-10-02 DIAGNOSIS — I10 Essential (primary) hypertension: Secondary | ICD-10-CM | POA: Diagnosis not present

## 2018-10-02 DIAGNOSIS — N186 End stage renal disease: Secondary | ICD-10-CM | POA: Diagnosis not present

## 2018-10-02 DIAGNOSIS — K861 Other chronic pancreatitis: Secondary | ICD-10-CM | POA: Diagnosis not present

## 2018-10-02 DIAGNOSIS — I313 Pericardial effusion (noninflammatory): Secondary | ICD-10-CM | POA: Diagnosis not present

## 2018-10-02 DIAGNOSIS — I5042 Chronic combined systolic (congestive) and diastolic (congestive) heart failure: Secondary | ICD-10-CM | POA: Diagnosis not present

## 2018-10-02 DIAGNOSIS — I82C21 Chronic embolism and thrombosis of right internal jugular vein: Secondary | ICD-10-CM | POA: Diagnosis not present

## 2018-10-02 DIAGNOSIS — J918 Pleural effusion in other conditions classified elsewhere: Secondary | ICD-10-CM | POA: Diagnosis not present

## 2018-10-02 DIAGNOSIS — T8612 Kidney transplant failure: Secondary | ICD-10-CM | POA: Diagnosis not present

## 2018-10-02 DIAGNOSIS — I132 Hypertensive heart and chronic kidney disease with heart failure and with stage 5 chronic kidney disease, or end stage renal disease: Secondary | ICD-10-CM | POA: Diagnosis not present

## 2018-10-02 DIAGNOSIS — Z9119 Patient's noncompliance with other medical treatment and regimen: Secondary | ICD-10-CM | POA: Diagnosis not present

## 2018-10-02 DIAGNOSIS — I82721 Chronic embolism and thrombosis of deep veins of right upper extremity: Secondary | ICD-10-CM | POA: Diagnosis not present

## 2018-10-02 DIAGNOSIS — L03113 Cellulitis of right upper limb: Secondary | ICD-10-CM | POA: Diagnosis not present

## 2018-10-02 DIAGNOSIS — L02413 Cutaneous abscess of right upper limb: Secondary | ICD-10-CM | POA: Diagnosis not present

## 2018-10-02 DIAGNOSIS — I82A11 Acute embolism and thrombosis of right axillary vein: Secondary | ICD-10-CM | POA: Diagnosis not present

## 2018-10-02 DIAGNOSIS — S51801A Unspecified open wound of right forearm, initial encounter: Secondary | ICD-10-CM | POA: Diagnosis not present

## 2018-10-02 DIAGNOSIS — R112 Nausea with vomiting, unspecified: Secondary | ICD-10-CM | POA: Diagnosis not present

## 2018-10-03 DIAGNOSIS — Z992 Dependence on renal dialysis: Secondary | ICD-10-CM | POA: Diagnosis not present

## 2018-10-03 DIAGNOSIS — I82A11 Acute embolism and thrombosis of right axillary vein: Secondary | ICD-10-CM | POA: Diagnosis not present

## 2018-10-03 DIAGNOSIS — I10 Essential (primary) hypertension: Secondary | ICD-10-CM | POA: Diagnosis not present

## 2018-10-03 DIAGNOSIS — R112 Nausea with vomiting, unspecified: Secondary | ICD-10-CM | POA: Diagnosis not present

## 2018-10-03 DIAGNOSIS — I313 Pericardial effusion (noninflammatory): Secondary | ICD-10-CM | POA: Diagnosis not present

## 2018-10-03 DIAGNOSIS — L03113 Cellulitis of right upper limb: Secondary | ICD-10-CM | POA: Diagnosis not present

## 2018-10-03 DIAGNOSIS — Z9119 Patient's noncompliance with other medical treatment and regimen: Secondary | ICD-10-CM | POA: Diagnosis not present

## 2018-10-03 DIAGNOSIS — N186 End stage renal disease: Secondary | ICD-10-CM | POA: Diagnosis not present

## 2018-10-03 DIAGNOSIS — K861 Other chronic pancreatitis: Secondary | ICD-10-CM | POA: Diagnosis not present

## 2018-10-03 IMAGING — US US THORACENTESIS ASP PLEURAL SPACE W/IMG GUIDE
1 series · 6 of 6 positions shown · non-contrast
Comparison: none

INDICATION: End-stage renal disease on hemodialysis. Shortness of breath.
Right-sided pleural effusion. Request for diagnostic and therapeutic
thoracentesis.

[Series 1: us thoracentesis asp pleural space w/img guide · 0.26mm/px · 6 of 6 slices shown]
[im 1/6]
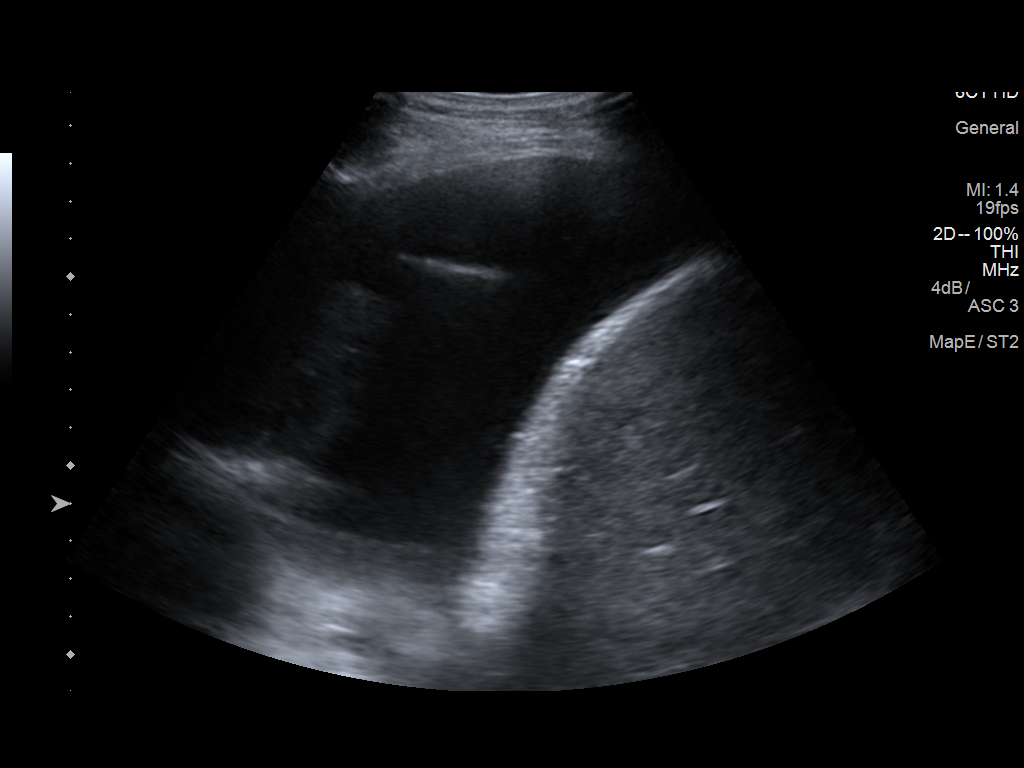
[im 2/6]
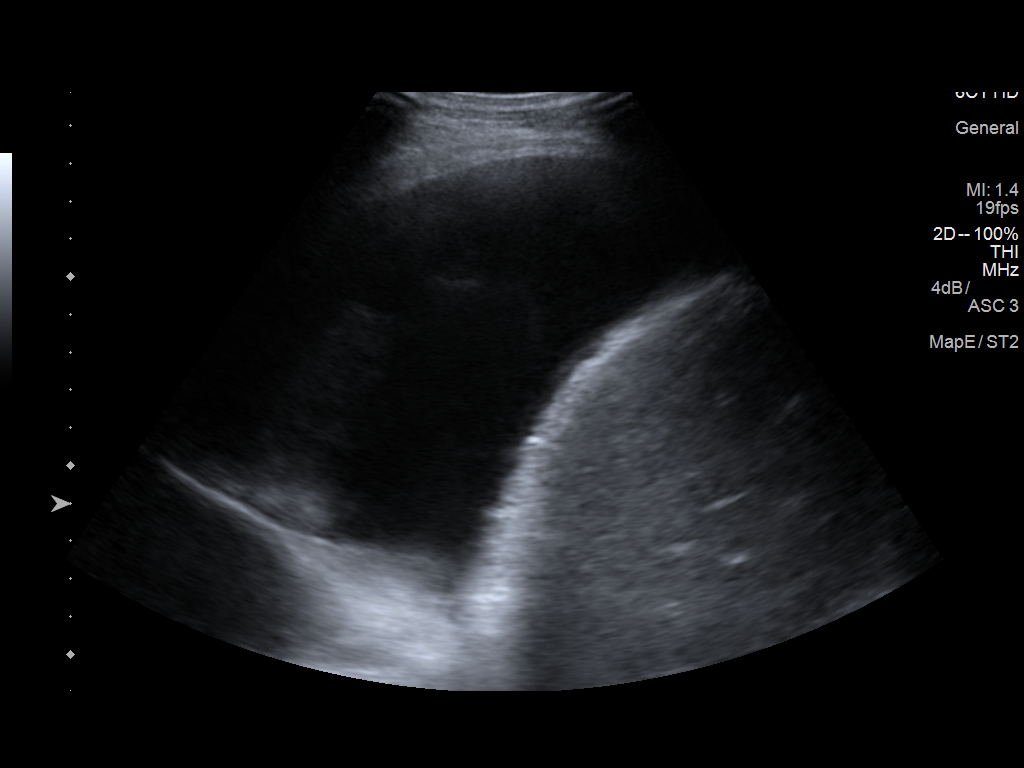
[im 3/6]
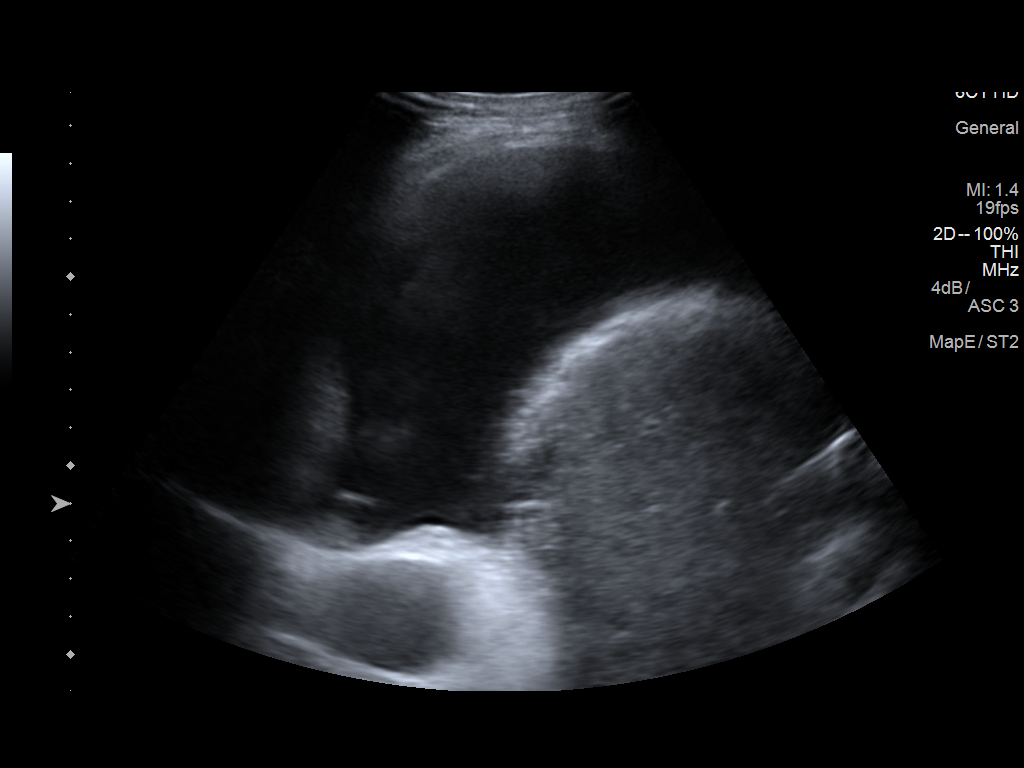
[im 4/6]
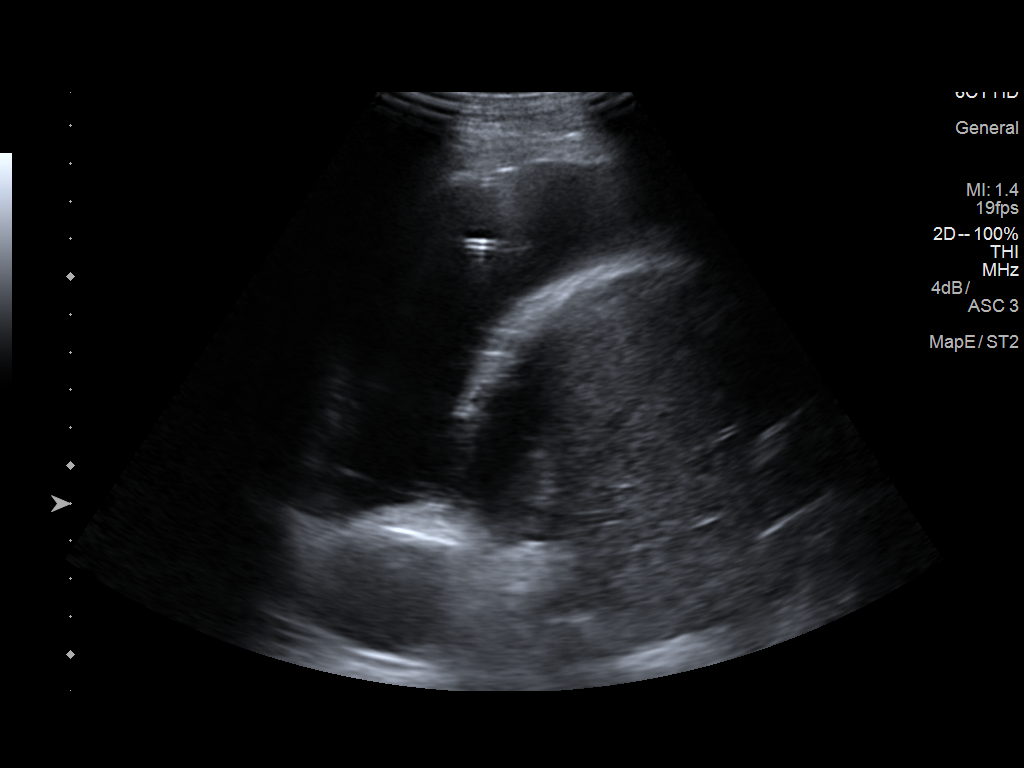
[im 5/6]
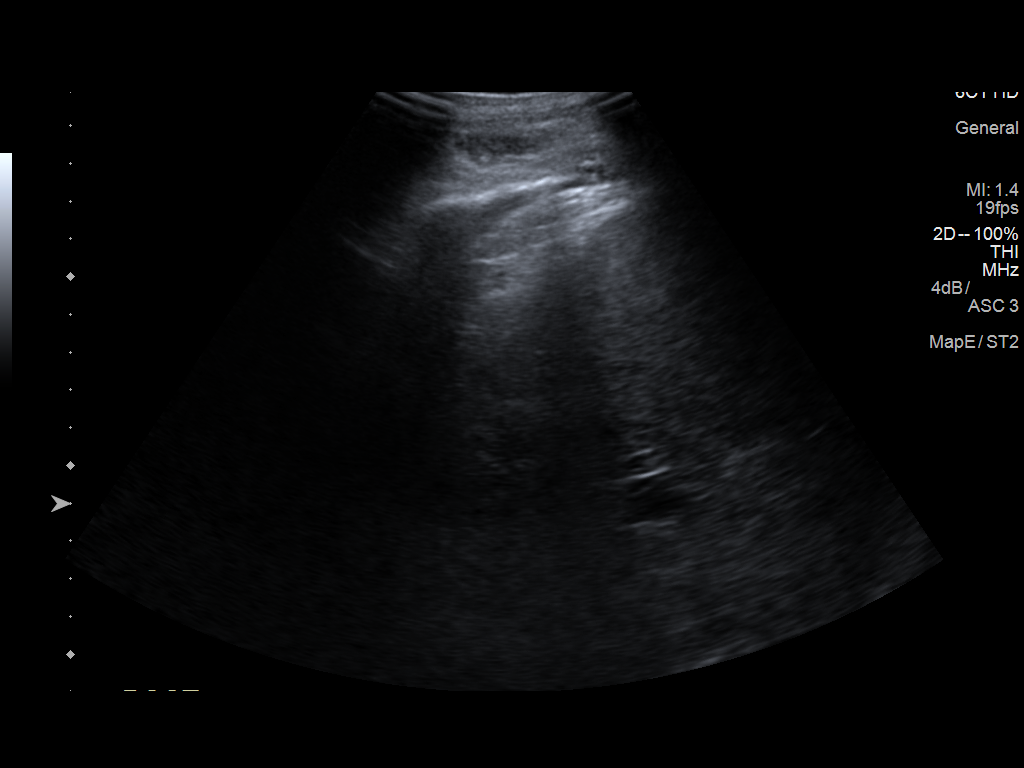
[im 6/6]
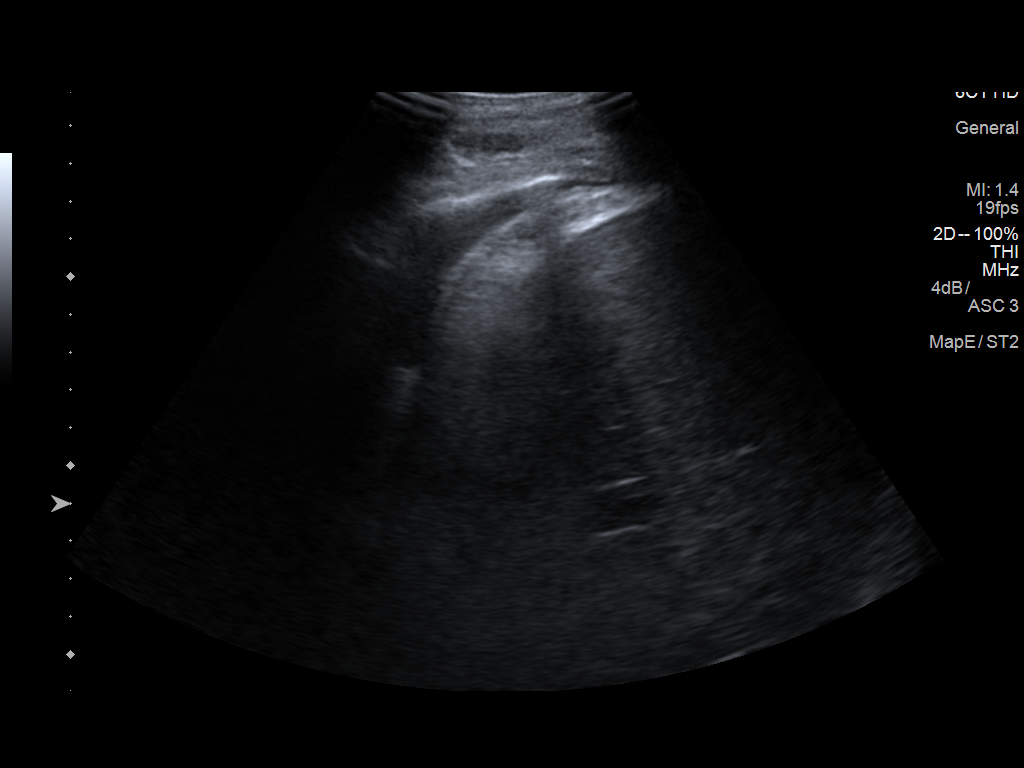

[6 of 6 positions shown; findings below may reference images not displayed]

EXAM:
ULTRASOUND GUIDED RIGHT THORACENTESIS

MEDICATIONS:
None.

COMPLICATIONS:
None immediate.

PROCEDURE:
An ultrasound guided thoracentesis was thoroughly discussed with the
patient and questions answered. The benefits, risks, alternatives
and complications were also discussed. The patient understands and
wishes to proceed with the procedure. Written consent was obtained.

Ultrasound was performed to localize and mark an adequate pocket of
fluid in the right chest. The area was then prepped and draped in
the normal sterile fashion. 1% Lidocaine was used for local
anesthesia. Under ultrasound guidance a 6 Fr Safe-T-Centesis
catheter was introduced. Thoracentesis was performed. The catheter
was removed and a dressing applied.
FINDINGS: A total of approximately 1.4 L of clear yellow fluid was removed.
Samples were sent to the laboratory as requested by the clinical
team.
IMPRESSION: Successful ultrasound guided right thoracentesis yielding 1.4 L of
pleural fluid.

## 2018-10-04 DIAGNOSIS — N186 End stage renal disease: Secondary | ICD-10-CM | POA: Diagnosis not present

## 2018-10-04 DIAGNOSIS — I82611 Acute embolism and thrombosis of superficial veins of right upper extremity: Secondary | ICD-10-CM | POA: Diagnosis not present

## 2018-10-04 DIAGNOSIS — I313 Pericardial effusion (noninflammatory): Secondary | ICD-10-CM | POA: Diagnosis not present

## 2018-10-04 DIAGNOSIS — I82C11 Acute embolism and thrombosis of right internal jugular vein: Secondary | ICD-10-CM | POA: Diagnosis not present

## 2018-10-04 DIAGNOSIS — L03113 Cellulitis of right upper limb: Secondary | ICD-10-CM | POA: Diagnosis not present

## 2018-10-04 DIAGNOSIS — Z992 Dependence on renal dialysis: Secondary | ICD-10-CM | POA: Diagnosis not present

## 2018-10-04 DIAGNOSIS — Z9119 Patient's noncompliance with other medical treatment and regimen: Secondary | ICD-10-CM | POA: Diagnosis not present

## 2018-10-04 DIAGNOSIS — R112 Nausea with vomiting, unspecified: Secondary | ICD-10-CM | POA: Diagnosis not present

## 2018-10-04 DIAGNOSIS — I82A11 Acute embolism and thrombosis of right axillary vein: Secondary | ICD-10-CM | POA: Diagnosis not present

## 2018-10-04 DIAGNOSIS — K861 Other chronic pancreatitis: Secondary | ICD-10-CM | POA: Diagnosis not present

## 2018-10-04 DIAGNOSIS — I10 Essential (primary) hypertension: Secondary | ICD-10-CM | POA: Diagnosis not present

## 2018-10-05 DIAGNOSIS — Z9119 Patient's noncompliance with other medical treatment and regimen: Secondary | ICD-10-CM | POA: Diagnosis not present

## 2018-10-05 DIAGNOSIS — N186 End stage renal disease: Secondary | ICD-10-CM | POA: Diagnosis not present

## 2018-10-05 DIAGNOSIS — I82A11 Acute embolism and thrombosis of right axillary vein: Secondary | ICD-10-CM | POA: Diagnosis not present

## 2018-10-05 DIAGNOSIS — R112 Nausea with vomiting, unspecified: Secondary | ICD-10-CM | POA: Diagnosis not present

## 2018-10-05 DIAGNOSIS — Z992 Dependence on renal dialysis: Secondary | ICD-10-CM | POA: Diagnosis not present

## 2018-10-05 DIAGNOSIS — I313 Pericardial effusion (noninflammatory): Secondary | ICD-10-CM | POA: Diagnosis not present

## 2018-10-05 DIAGNOSIS — L03113 Cellulitis of right upper limb: Secondary | ICD-10-CM | POA: Diagnosis not present

## 2018-10-05 DIAGNOSIS — K861 Other chronic pancreatitis: Secondary | ICD-10-CM | POA: Diagnosis not present

## 2018-10-05 DIAGNOSIS — I10 Essential (primary) hypertension: Secondary | ICD-10-CM | POA: Diagnosis not present

## 2018-10-06 ENCOUNTER — Other Ambulatory Visit: Payer: Self-pay | Admitting: Cardiothoracic Surgery

## 2018-10-06 DIAGNOSIS — I82A11 Acute embolism and thrombosis of right axillary vein: Secondary | ICD-10-CM | POA: Diagnosis not present

## 2018-10-06 DIAGNOSIS — K861 Other chronic pancreatitis: Secondary | ICD-10-CM | POA: Diagnosis not present

## 2018-10-06 DIAGNOSIS — G4731 Primary central sleep apnea: Secondary | ICD-10-CM | POA: Diagnosis not present

## 2018-10-06 DIAGNOSIS — R112 Nausea with vomiting, unspecified: Secondary | ICD-10-CM | POA: Diagnosis not present

## 2018-10-06 DIAGNOSIS — I10 Essential (primary) hypertension: Secondary | ICD-10-CM | POA: Diagnosis not present

## 2018-10-06 DIAGNOSIS — L03113 Cellulitis of right upper limb: Secondary | ICD-10-CM | POA: Diagnosis not present

## 2018-10-06 DIAGNOSIS — J869 Pyothorax without fistula: Secondary | ICD-10-CM

## 2018-10-06 DIAGNOSIS — I313 Pericardial effusion (noninflammatory): Secondary | ICD-10-CM | POA: Diagnosis not present

## 2018-10-06 IMAGING — CR DG CHEST 2V
2 series · 2 of 2 positions shown · non-contrast
Comparison: 05/14/2016 CXR and

CLINICAL DATA: Dyspnea with fatigue. Low back pain and dizziness
after dialysis.

EXAM:
CHEST  2 VIEW

[chest lat]
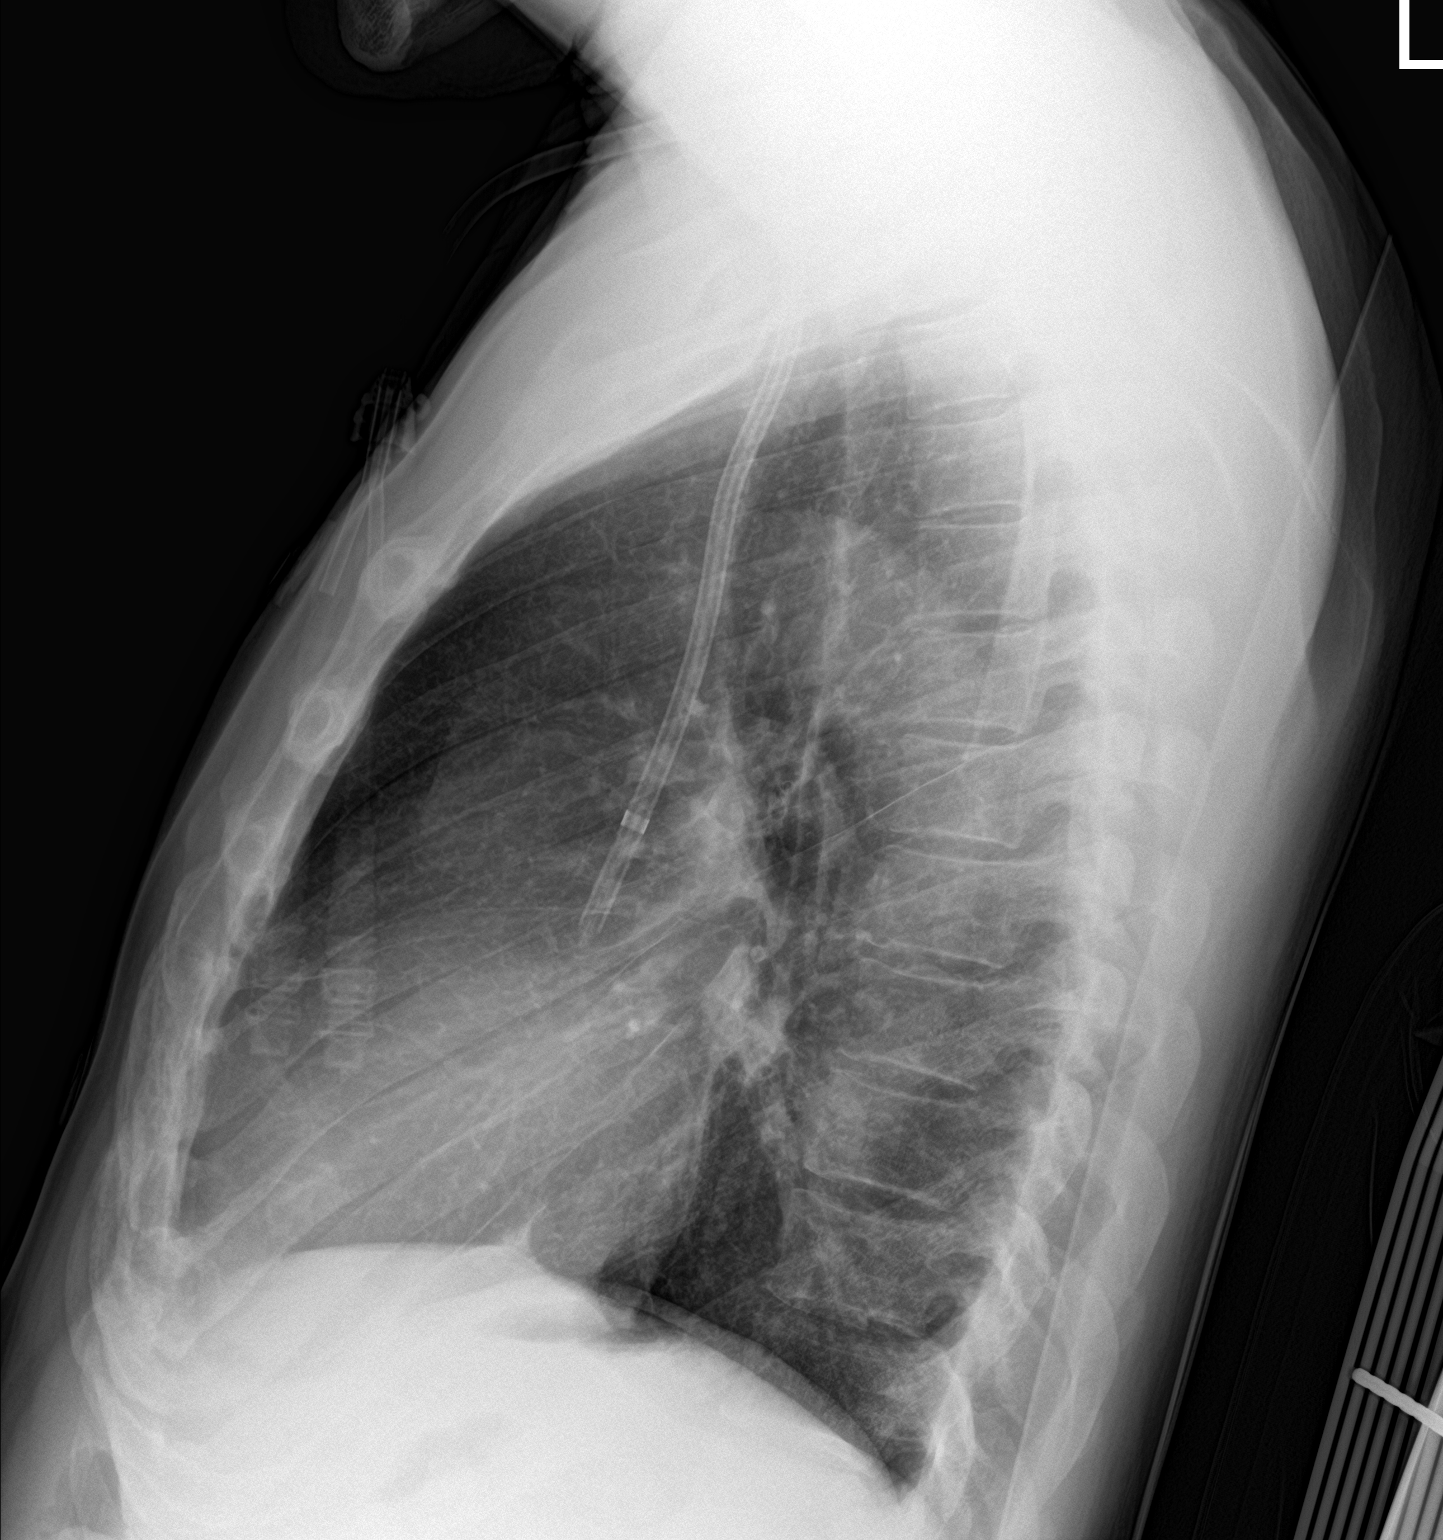

[chest ap]
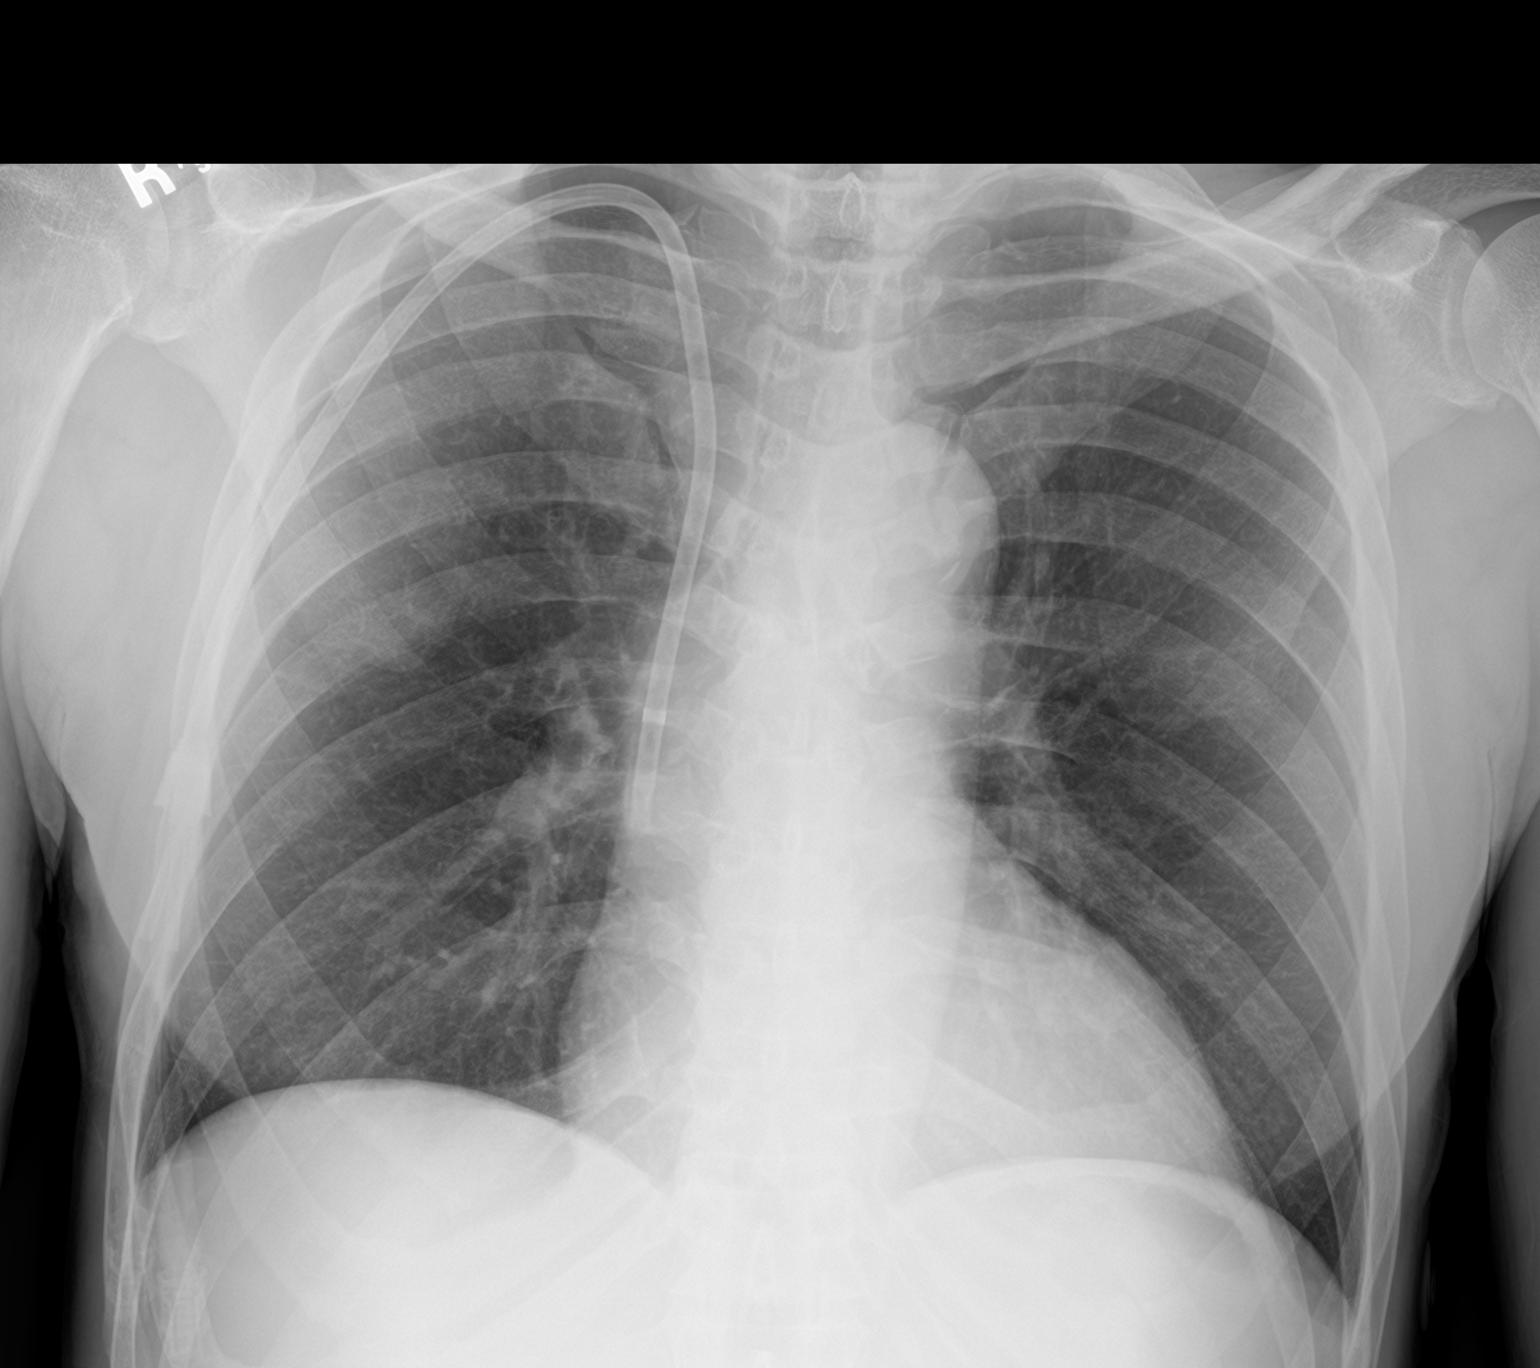

[2 of 2 positions shown; findings below may reference images not displayed]

FINDINGS: Right dialysis catheter tip is seen in the distal SVC. Aortic
atherosclerosis without aneurysm is again noted. The cardiac
silhouette is top-normal in size allowing for portable technique.
Lungs are free of pneumonic consolidations. No effusions or
pulmonary edema. No pneumothorax. No suspicious osseous
abnormalities.
IMPRESSION: No acute cardiopulmonary disease.

## 2018-10-07 ENCOUNTER — Ambulatory Visit: Payer: Medicare Other | Admitting: Cardiothoracic Surgery

## 2018-10-07 DIAGNOSIS — Z992 Dependence on renal dialysis: Secondary | ICD-10-CM | POA: Diagnosis not present

## 2018-10-07 DIAGNOSIS — G4731 Primary central sleep apnea: Secondary | ICD-10-CM | POA: Diagnosis not present

## 2018-10-07 DIAGNOSIS — I82A11 Acute embolism and thrombosis of right axillary vein: Secondary | ICD-10-CM | POA: Diagnosis not present

## 2018-10-07 DIAGNOSIS — N186 End stage renal disease: Secondary | ICD-10-CM | POA: Diagnosis not present

## 2018-10-07 DIAGNOSIS — K861 Other chronic pancreatitis: Secondary | ICD-10-CM | POA: Diagnosis not present

## 2018-10-07 DIAGNOSIS — R112 Nausea with vomiting, unspecified: Secondary | ICD-10-CM | POA: Diagnosis not present

## 2018-10-07 DIAGNOSIS — I313 Pericardial effusion (noninflammatory): Secondary | ICD-10-CM | POA: Diagnosis not present

## 2018-10-07 DIAGNOSIS — I10 Essential (primary) hypertension: Secondary | ICD-10-CM | POA: Diagnosis not present

## 2018-10-07 DIAGNOSIS — L03113 Cellulitis of right upper limb: Secondary | ICD-10-CM | POA: Diagnosis not present

## 2018-10-08 ENCOUNTER — Encounter: Payer: Self-pay | Admitting: Cardiothoracic Surgery

## 2018-10-08 DIAGNOSIS — G4731 Primary central sleep apnea: Secondary | ICD-10-CM | POA: Diagnosis not present

## 2018-10-08 DIAGNOSIS — I82A11 Acute embolism and thrombosis of right axillary vein: Secondary | ICD-10-CM | POA: Diagnosis not present

## 2018-10-08 DIAGNOSIS — I10 Essential (primary) hypertension: Secondary | ICD-10-CM | POA: Diagnosis not present

## 2018-10-08 DIAGNOSIS — L03113 Cellulitis of right upper limb: Secondary | ICD-10-CM | POA: Diagnosis not present

## 2018-10-08 DIAGNOSIS — K861 Other chronic pancreatitis: Secondary | ICD-10-CM | POA: Diagnosis not present

## 2018-10-08 DIAGNOSIS — R112 Nausea with vomiting, unspecified: Secondary | ICD-10-CM | POA: Diagnosis not present

## 2018-10-08 DIAGNOSIS — I313 Pericardial effusion (noninflammatory): Secondary | ICD-10-CM | POA: Diagnosis not present

## 2018-10-09 DIAGNOSIS — Z992 Dependence on renal dialysis: Secondary | ICD-10-CM | POA: Diagnosis not present

## 2018-10-09 DIAGNOSIS — G4731 Primary central sleep apnea: Secondary | ICD-10-CM | POA: Diagnosis not present

## 2018-10-09 DIAGNOSIS — R112 Nausea with vomiting, unspecified: Secondary | ICD-10-CM | POA: Diagnosis not present

## 2018-10-09 DIAGNOSIS — I313 Pericardial effusion (noninflammatory): Secondary | ICD-10-CM | POA: Diagnosis not present

## 2018-10-09 DIAGNOSIS — N186 End stage renal disease: Secondary | ICD-10-CM | POA: Diagnosis not present

## 2018-10-09 DIAGNOSIS — I10 Essential (primary) hypertension: Secondary | ICD-10-CM | POA: Diagnosis not present

## 2018-10-09 DIAGNOSIS — I82A11 Acute embolism and thrombosis of right axillary vein: Secondary | ICD-10-CM | POA: Diagnosis not present

## 2018-10-09 DIAGNOSIS — L03113 Cellulitis of right upper limb: Secondary | ICD-10-CM | POA: Diagnosis not present

## 2018-10-09 DIAGNOSIS — K861 Other chronic pancreatitis: Secondary | ICD-10-CM | POA: Diagnosis not present

## 2018-10-10 DIAGNOSIS — L03113 Cellulitis of right upper limb: Secondary | ICD-10-CM | POA: Diagnosis not present

## 2018-10-10 DIAGNOSIS — I10 Essential (primary) hypertension: Secondary | ICD-10-CM | POA: Diagnosis not present

## 2018-10-10 DIAGNOSIS — G4731 Primary central sleep apnea: Secondary | ICD-10-CM | POA: Diagnosis not present

## 2018-10-10 DIAGNOSIS — R112 Nausea with vomiting, unspecified: Secondary | ICD-10-CM | POA: Diagnosis not present

## 2018-10-10 DIAGNOSIS — K861 Other chronic pancreatitis: Secondary | ICD-10-CM | POA: Diagnosis not present

## 2018-10-10 DIAGNOSIS — I313 Pericardial effusion (noninflammatory): Secondary | ICD-10-CM | POA: Diagnosis not present

## 2018-10-10 DIAGNOSIS — I82A11 Acute embolism and thrombosis of right axillary vein: Secondary | ICD-10-CM | POA: Diagnosis not present

## 2018-10-11 DIAGNOSIS — G4731 Primary central sleep apnea: Secondary | ICD-10-CM | POA: Diagnosis not present

## 2018-10-11 DIAGNOSIS — I10 Essential (primary) hypertension: Secondary | ICD-10-CM | POA: Diagnosis not present

## 2018-10-11 DIAGNOSIS — I82A11 Acute embolism and thrombosis of right axillary vein: Secondary | ICD-10-CM | POA: Diagnosis not present

## 2018-10-11 DIAGNOSIS — K861 Other chronic pancreatitis: Secondary | ICD-10-CM | POA: Diagnosis not present

## 2018-10-11 DIAGNOSIS — R112 Nausea with vomiting, unspecified: Secondary | ICD-10-CM | POA: Diagnosis not present

## 2018-10-11 DIAGNOSIS — L03113 Cellulitis of right upper limb: Secondary | ICD-10-CM | POA: Diagnosis not present

## 2018-10-11 DIAGNOSIS — I313 Pericardial effusion (noninflammatory): Secondary | ICD-10-CM | POA: Diagnosis not present

## 2018-10-12 DIAGNOSIS — K861 Other chronic pancreatitis: Secondary | ICD-10-CM | POA: Diagnosis not present

## 2018-10-12 DIAGNOSIS — I313 Pericardial effusion (noninflammatory): Secondary | ICD-10-CM | POA: Diagnosis not present

## 2018-10-12 DIAGNOSIS — N186 End stage renal disease: Secondary | ICD-10-CM | POA: Diagnosis not present

## 2018-10-12 DIAGNOSIS — L03113 Cellulitis of right upper limb: Secondary | ICD-10-CM | POA: Diagnosis not present

## 2018-10-12 DIAGNOSIS — I82A11 Acute embolism and thrombosis of right axillary vein: Secondary | ICD-10-CM | POA: Diagnosis not present

## 2018-10-12 DIAGNOSIS — I10 Essential (primary) hypertension: Secondary | ICD-10-CM | POA: Diagnosis not present

## 2018-10-12 DIAGNOSIS — G4731 Primary central sleep apnea: Secondary | ICD-10-CM | POA: Diagnosis not present

## 2018-10-12 DIAGNOSIS — R112 Nausea with vomiting, unspecified: Secondary | ICD-10-CM | POA: Diagnosis not present

## 2018-10-12 DIAGNOSIS — Z992 Dependence on renal dialysis: Secondary | ICD-10-CM | POA: Diagnosis not present

## 2018-10-12 DIAGNOSIS — Z452 Encounter for adjustment and management of vascular access device: Secondary | ICD-10-CM | POA: Diagnosis not present

## 2018-10-13 DIAGNOSIS — L03113 Cellulitis of right upper limb: Secondary | ICD-10-CM | POA: Diagnosis not present

## 2018-10-13 DIAGNOSIS — Z992 Dependence on renal dialysis: Secondary | ICD-10-CM | POA: Diagnosis not present

## 2018-10-13 DIAGNOSIS — N186 End stage renal disease: Secondary | ICD-10-CM | POA: Diagnosis not present

## 2018-10-13 DIAGNOSIS — K219 Gastro-esophageal reflux disease without esophagitis: Secondary | ICD-10-CM | POA: Diagnosis not present

## 2018-10-13 DIAGNOSIS — K861 Other chronic pancreatitis: Secondary | ICD-10-CM | POA: Diagnosis not present

## 2018-10-13 DIAGNOSIS — R112 Nausea with vomiting, unspecified: Secondary | ICD-10-CM | POA: Diagnosis not present

## 2018-10-13 DIAGNOSIS — Z9119 Patient's noncompliance with other medical treatment and regimen: Secondary | ICD-10-CM | POA: Diagnosis not present

## 2018-10-13 DIAGNOSIS — I313 Pericardial effusion (noninflammatory): Secondary | ICD-10-CM | POA: Diagnosis not present

## 2018-10-13 DIAGNOSIS — I10 Essential (primary) hypertension: Secondary | ICD-10-CM | POA: Diagnosis not present

## 2018-10-13 DIAGNOSIS — I82A11 Acute embolism and thrombosis of right axillary vein: Secondary | ICD-10-CM | POA: Diagnosis not present

## 2018-10-13 DIAGNOSIS — G4731 Primary central sleep apnea: Secondary | ICD-10-CM | POA: Diagnosis not present

## 2018-10-14 DIAGNOSIS — I10 Essential (primary) hypertension: Secondary | ICD-10-CM | POA: Diagnosis not present

## 2018-10-14 DIAGNOSIS — K861 Other chronic pancreatitis: Secondary | ICD-10-CM | POA: Diagnosis not present

## 2018-10-14 DIAGNOSIS — Z992 Dependence on renal dialysis: Secondary | ICD-10-CM | POA: Diagnosis not present

## 2018-10-14 DIAGNOSIS — M79631 Pain in right forearm: Secondary | ICD-10-CM | POA: Diagnosis not present

## 2018-10-14 DIAGNOSIS — I313 Pericardial effusion (noninflammatory): Secondary | ICD-10-CM | POA: Diagnosis not present

## 2018-10-14 DIAGNOSIS — K219 Gastro-esophageal reflux disease without esophagitis: Secondary | ICD-10-CM | POA: Diagnosis not present

## 2018-10-14 DIAGNOSIS — Z9119 Patient's noncompliance with other medical treatment and regimen: Secondary | ICD-10-CM | POA: Diagnosis not present

## 2018-10-14 DIAGNOSIS — I82A11 Acute embolism and thrombosis of right axillary vein: Secondary | ICD-10-CM | POA: Diagnosis not present

## 2018-10-14 DIAGNOSIS — G4731 Primary central sleep apnea: Secondary | ICD-10-CM | POA: Diagnosis not present

## 2018-10-14 DIAGNOSIS — N186 End stage renal disease: Secondary | ICD-10-CM | POA: Diagnosis not present

## 2018-10-14 DIAGNOSIS — L03113 Cellulitis of right upper limb: Secondary | ICD-10-CM | POA: Diagnosis not present

## 2018-10-14 DIAGNOSIS — R112 Nausea with vomiting, unspecified: Secondary | ICD-10-CM | POA: Diagnosis not present

## 2018-10-14 DIAGNOSIS — R600 Localized edema: Secondary | ICD-10-CM | POA: Diagnosis not present

## 2018-10-15 DIAGNOSIS — I313 Pericardial effusion (noninflammatory): Secondary | ICD-10-CM | POA: Diagnosis not present

## 2018-10-15 DIAGNOSIS — N186 End stage renal disease: Secondary | ICD-10-CM | POA: Diagnosis not present

## 2018-10-15 DIAGNOSIS — G4731 Primary central sleep apnea: Secondary | ICD-10-CM | POA: Diagnosis not present

## 2018-10-15 DIAGNOSIS — K861 Other chronic pancreatitis: Secondary | ICD-10-CM | POA: Diagnosis not present

## 2018-10-15 DIAGNOSIS — Z992 Dependence on renal dialysis: Secondary | ICD-10-CM | POA: Diagnosis not present

## 2018-10-15 DIAGNOSIS — K219 Gastro-esophageal reflux disease without esophagitis: Secondary | ICD-10-CM | POA: Diagnosis not present

## 2018-10-15 DIAGNOSIS — R112 Nausea with vomiting, unspecified: Secondary | ICD-10-CM | POA: Diagnosis not present

## 2018-10-15 DIAGNOSIS — L03113 Cellulitis of right upper limb: Secondary | ICD-10-CM | POA: Diagnosis not present

## 2018-10-15 DIAGNOSIS — I10 Essential (primary) hypertension: Secondary | ICD-10-CM | POA: Diagnosis not present

## 2018-10-15 DIAGNOSIS — I82A11 Acute embolism and thrombosis of right axillary vein: Secondary | ICD-10-CM | POA: Diagnosis not present

## 2018-10-16 DIAGNOSIS — G4731 Primary central sleep apnea: Secondary | ICD-10-CM | POA: Diagnosis not present

## 2018-10-16 DIAGNOSIS — I10 Essential (primary) hypertension: Secondary | ICD-10-CM | POA: Diagnosis not present

## 2018-10-16 DIAGNOSIS — L03113 Cellulitis of right upper limb: Secondary | ICD-10-CM | POA: Diagnosis not present

## 2018-10-16 DIAGNOSIS — N186 End stage renal disease: Secondary | ICD-10-CM | POA: Diagnosis not present

## 2018-10-16 DIAGNOSIS — I82A11 Acute embolism and thrombosis of right axillary vein: Secondary | ICD-10-CM | POA: Diagnosis not present

## 2018-10-16 DIAGNOSIS — K219 Gastro-esophageal reflux disease without esophagitis: Secondary | ICD-10-CM | POA: Diagnosis not present

## 2018-10-16 DIAGNOSIS — I313 Pericardial effusion (noninflammatory): Secondary | ICD-10-CM | POA: Diagnosis not present

## 2018-10-16 DIAGNOSIS — Z992 Dependence on renal dialysis: Secondary | ICD-10-CM | POA: Diagnosis not present

## 2018-10-16 DIAGNOSIS — K861 Other chronic pancreatitis: Secondary | ICD-10-CM | POA: Diagnosis not present

## 2018-10-16 DIAGNOSIS — R112 Nausea with vomiting, unspecified: Secondary | ICD-10-CM | POA: Diagnosis not present

## 2018-10-16 IMAGING — US US ABDOMEN LIMITED
1 series · 14 of 25 positions shown · non-contrast
Comparison: Body CT 02/04/2018

CLINICAL DATA: Right upper quadrant abdominal pain.

EXAM:
ULTRASOUND ABDOMEN LIMITED RIGHT UPPER QUADRANT

[Series 1: us abdomen limited · 0.22mm/px · 14 of 28 slices shown]
[im 1/28]
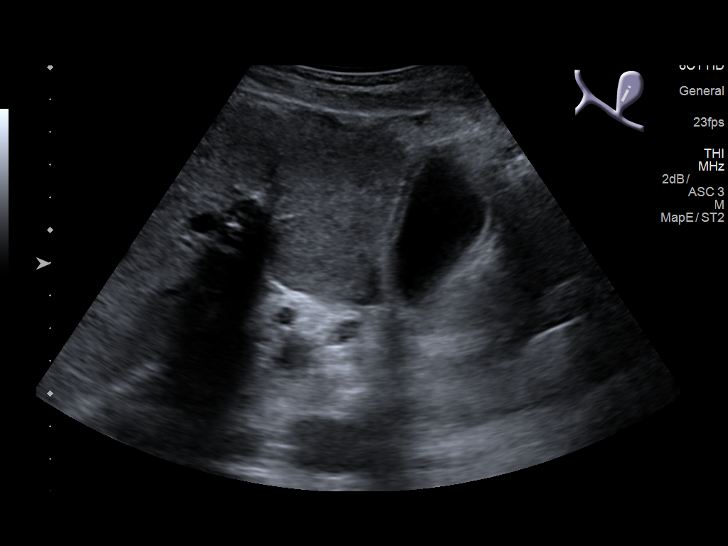
[im 3/28]
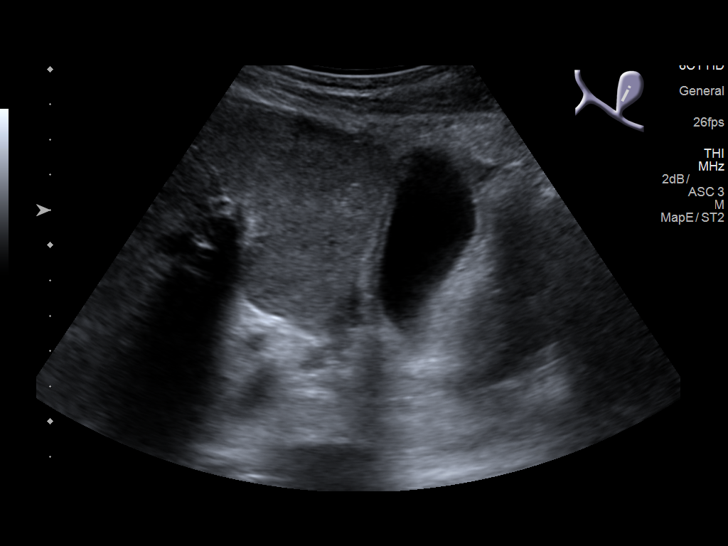
[im 5/28]
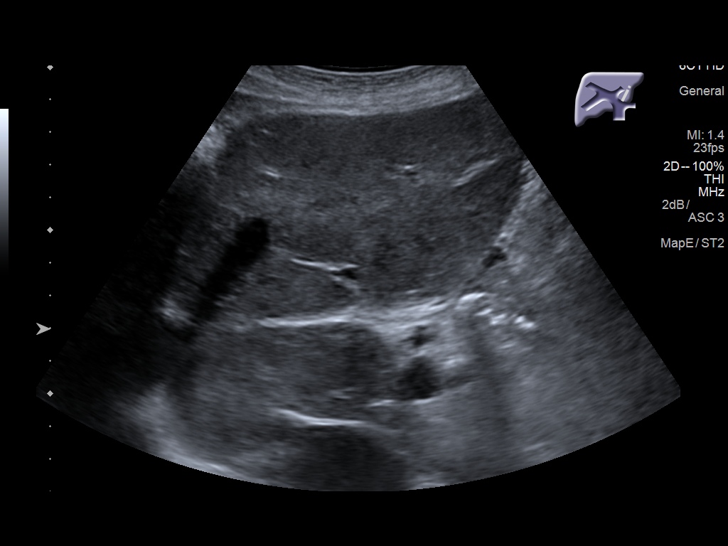
[im 7/28]
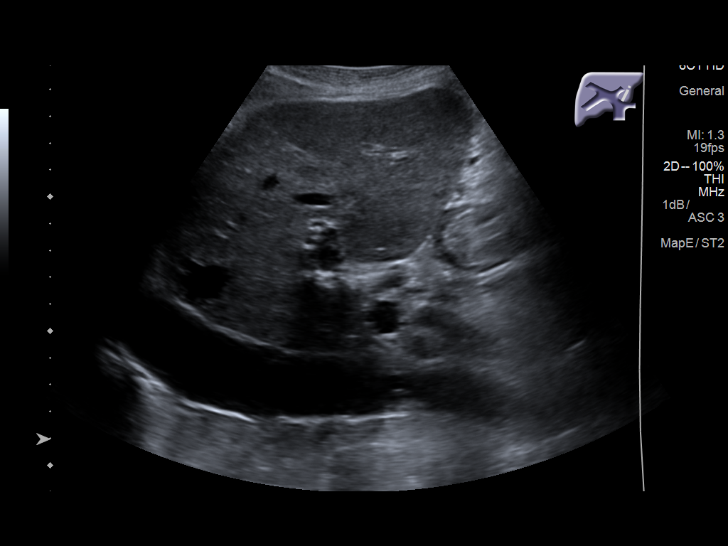
[im 10/28]
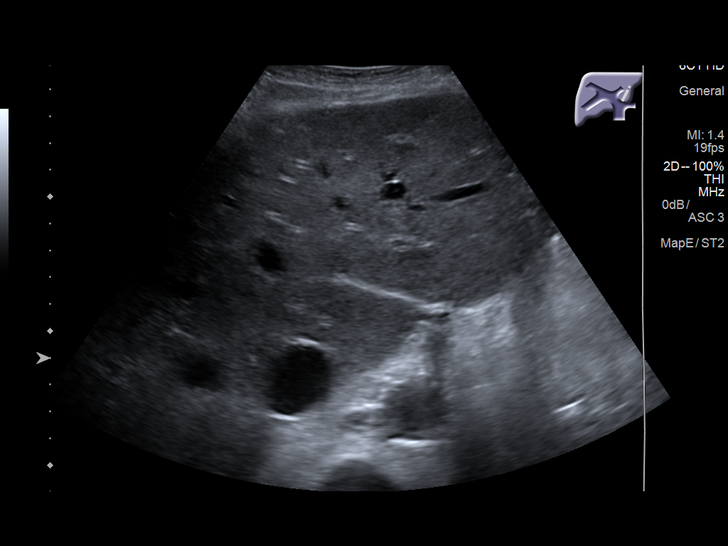
[im 11/28]
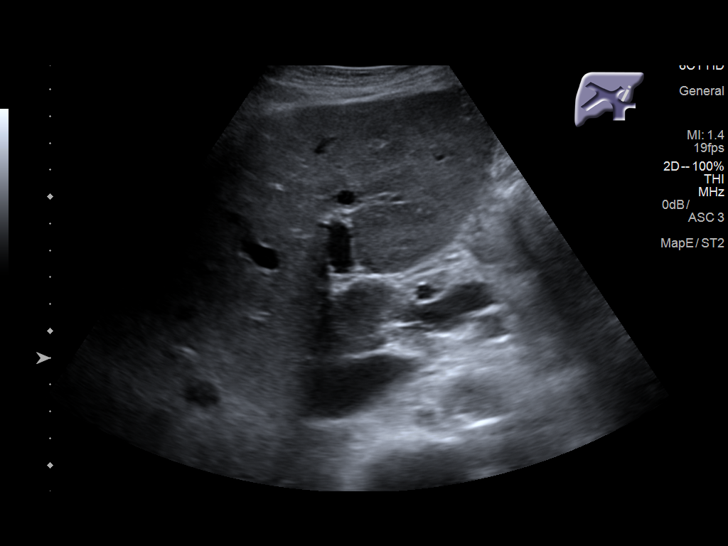
[im 13/28]
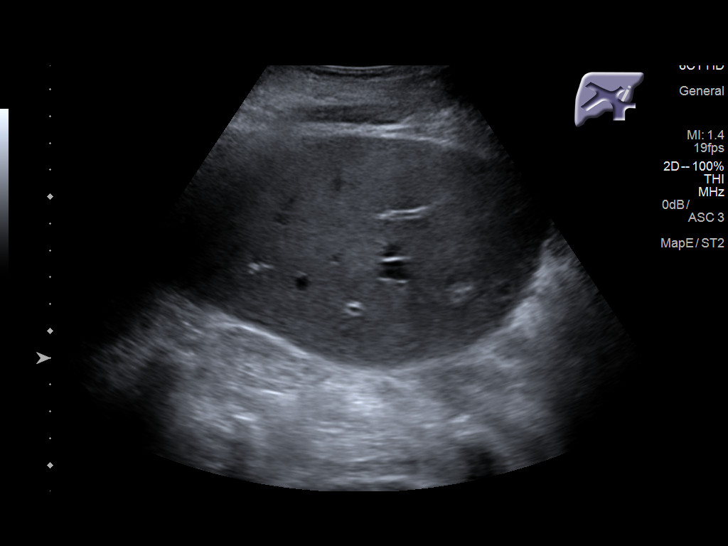
[im 15/28]
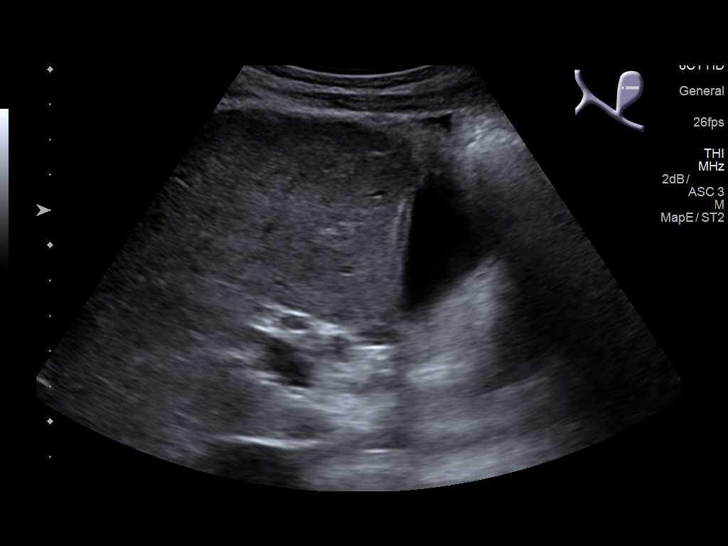
[im 17/28]
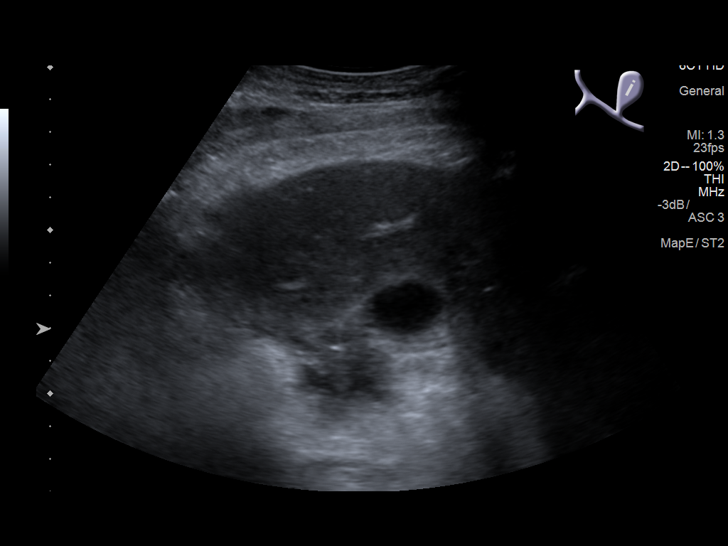
[im 19/28]
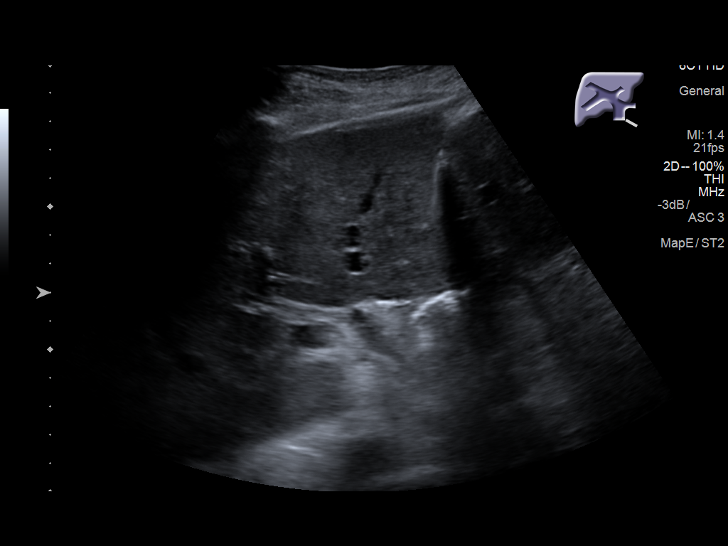
[im 21/28]
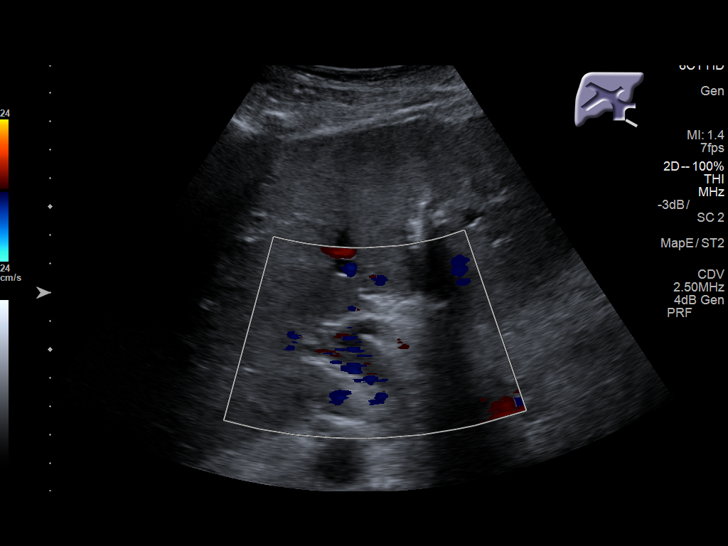
[im 23/28]
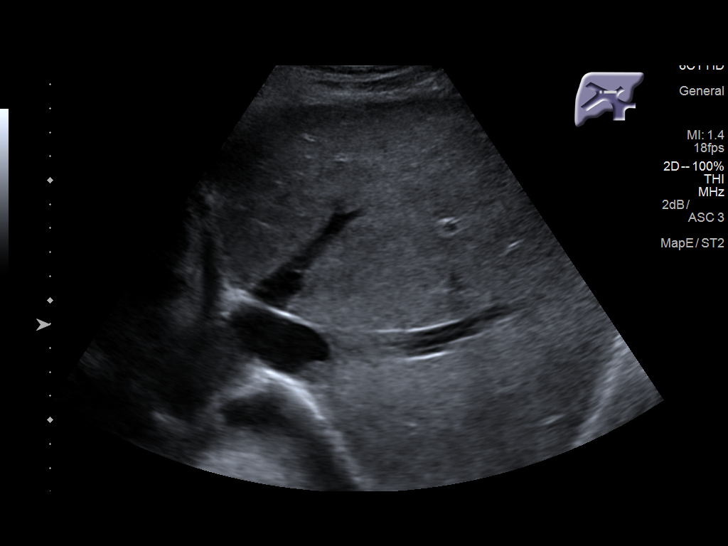
[im 25/28]
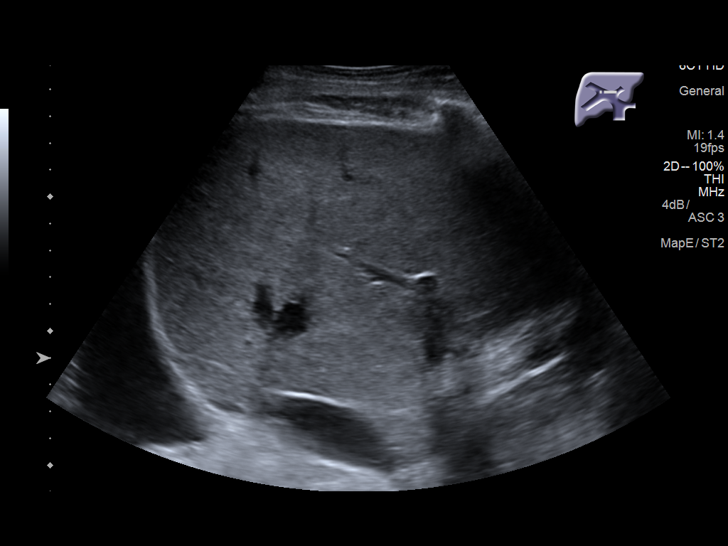
[im 28/28]
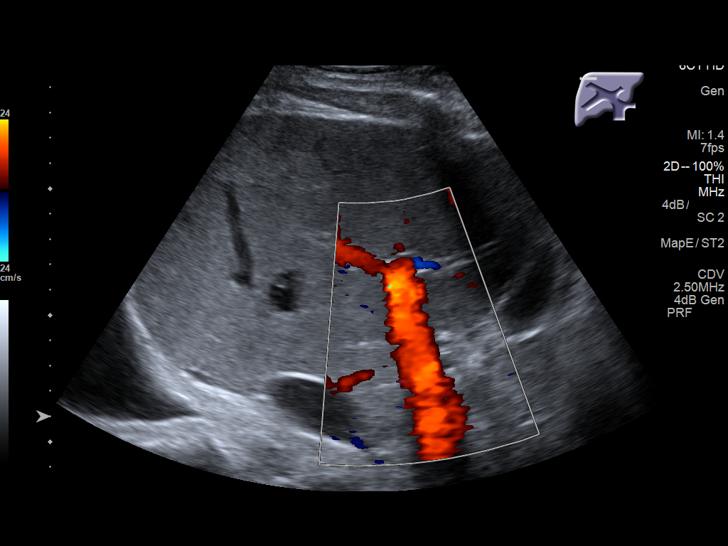

[14 of 25 positions shown; findings below may reference images not displayed]

FINDINGS: Gallbladder:

There is mild gallbladder wall thickening of up to 4 mm. No
gallstones are seen. The sonographic Murphy's sign is recorded as
negative.

Common bile duct:

Diameter: 5 mm

Liver:

No focal lesion identified. Within normal limits in parenchymal
echogenicity. Portal vein is patent on color Doppler imaging with
normal direction of blood flow towards the liver.

Atrophic right kidney noted.

Small right pleural effusion.
IMPRESSION: Mild gallbladder wall thickening without evidence of cholelithiasis.

Small right pleural effusion.

## 2018-10-17 DIAGNOSIS — K861 Other chronic pancreatitis: Secondary | ICD-10-CM | POA: Diagnosis not present

## 2018-10-17 DIAGNOSIS — Z992 Dependence on renal dialysis: Secondary | ICD-10-CM | POA: Diagnosis not present

## 2018-10-17 DIAGNOSIS — K219 Gastro-esophageal reflux disease without esophagitis: Secondary | ICD-10-CM | POA: Diagnosis not present

## 2018-10-17 DIAGNOSIS — R112 Nausea with vomiting, unspecified: Secondary | ICD-10-CM | POA: Diagnosis not present

## 2018-10-17 DIAGNOSIS — L03113 Cellulitis of right upper limb: Secondary | ICD-10-CM | POA: Diagnosis not present

## 2018-10-17 DIAGNOSIS — N186 End stage renal disease: Secondary | ICD-10-CM | POA: Diagnosis not present

## 2018-10-17 DIAGNOSIS — I313 Pericardial effusion (noninflammatory): Secondary | ICD-10-CM | POA: Diagnosis not present

## 2018-10-17 DIAGNOSIS — I10 Essential (primary) hypertension: Secondary | ICD-10-CM | POA: Diagnosis not present

## 2018-10-17 DIAGNOSIS — G4731 Primary central sleep apnea: Secondary | ICD-10-CM | POA: Diagnosis not present

## 2018-10-17 DIAGNOSIS — I82A11 Acute embolism and thrombosis of right axillary vein: Secondary | ICD-10-CM | POA: Diagnosis not present

## 2018-10-18 DIAGNOSIS — I82A11 Acute embolism and thrombosis of right axillary vein: Secondary | ICD-10-CM | POA: Diagnosis not present

## 2018-10-18 DIAGNOSIS — Z992 Dependence on renal dialysis: Secondary | ICD-10-CM | POA: Diagnosis not present

## 2018-10-18 DIAGNOSIS — M25522 Pain in left elbow: Secondary | ICD-10-CM | POA: Diagnosis not present

## 2018-10-18 DIAGNOSIS — K219 Gastro-esophageal reflux disease without esophagitis: Secondary | ICD-10-CM | POA: Diagnosis not present

## 2018-10-18 DIAGNOSIS — G4731 Primary central sleep apnea: Secondary | ICD-10-CM | POA: Diagnosis not present

## 2018-10-18 DIAGNOSIS — K861 Other chronic pancreatitis: Secondary | ICD-10-CM | POA: Diagnosis not present

## 2018-10-18 DIAGNOSIS — M19022 Primary osteoarthritis, left elbow: Secondary | ICD-10-CM | POA: Diagnosis not present

## 2018-10-18 DIAGNOSIS — I10 Essential (primary) hypertension: Secondary | ICD-10-CM | POA: Diagnosis not present

## 2018-10-18 DIAGNOSIS — N186 End stage renal disease: Secondary | ICD-10-CM | POA: Diagnosis not present

## 2018-10-18 DIAGNOSIS — I313 Pericardial effusion (noninflammatory): Secondary | ICD-10-CM | POA: Diagnosis not present

## 2018-10-18 DIAGNOSIS — L03113 Cellulitis of right upper limb: Secondary | ICD-10-CM | POA: Diagnosis not present

## 2018-10-18 DIAGNOSIS — R112 Nausea with vomiting, unspecified: Secondary | ICD-10-CM | POA: Diagnosis not present

## 2018-10-19 DIAGNOSIS — R600 Localized edema: Secondary | ICD-10-CM | POA: Diagnosis not present

## 2018-10-19 DIAGNOSIS — N186 End stage renal disease: Secondary | ICD-10-CM | POA: Diagnosis not present

## 2018-10-19 DIAGNOSIS — I10 Essential (primary) hypertension: Secondary | ICD-10-CM | POA: Diagnosis not present

## 2018-10-19 DIAGNOSIS — K219 Gastro-esophageal reflux disease without esophagitis: Secondary | ICD-10-CM | POA: Diagnosis not present

## 2018-10-19 DIAGNOSIS — I82A11 Acute embolism and thrombosis of right axillary vein: Secondary | ICD-10-CM | POA: Diagnosis not present

## 2018-10-19 DIAGNOSIS — L03113 Cellulitis of right upper limb: Secondary | ICD-10-CM | POA: Diagnosis not present

## 2018-10-19 DIAGNOSIS — G4731 Primary central sleep apnea: Secondary | ICD-10-CM | POA: Diagnosis not present

## 2018-10-19 DIAGNOSIS — K861 Other chronic pancreatitis: Secondary | ICD-10-CM | POA: Diagnosis not present

## 2018-10-19 DIAGNOSIS — R112 Nausea with vomiting, unspecified: Secondary | ICD-10-CM | POA: Diagnosis not present

## 2018-10-19 DIAGNOSIS — Z992 Dependence on renal dialysis: Secondary | ICD-10-CM | POA: Diagnosis not present

## 2018-10-19 DIAGNOSIS — I313 Pericardial effusion (noninflammatory): Secondary | ICD-10-CM | POA: Diagnosis not present

## 2018-10-19 DIAGNOSIS — L089 Local infection of the skin and subcutaneous tissue, unspecified: Secondary | ICD-10-CM | POA: Diagnosis not present

## 2018-10-19 IMAGING — MR MR HEAD W/O CM
9 of 10 series · 37 of 48 positions shown · non-contrast
Comparison: Prior CT from 05/05/2014.

CLINICAL DATA: Initial evaluation for left-sided numbness.

EXAM:
MRI HEAD WITHOUT CONTRAST
TECHNIQUE: Multiplanar, multiecho pulse sequences of the brain and surrounding
structures were obtained without intravenous contrast.

[Series 3: DWI · axial · 3.0mm · 1.09mm/px · z∈[-46,+91]mm · 11 of 94 slices shown (1 of 4)]
[im 1/94]
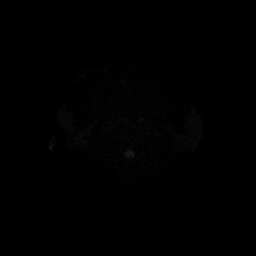
[im 10/94]
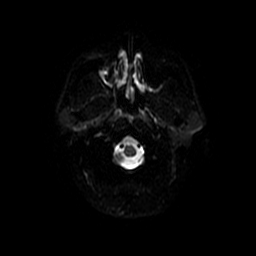
[im 19/94]
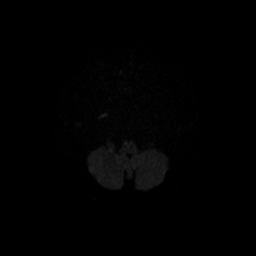
[im 28/94]
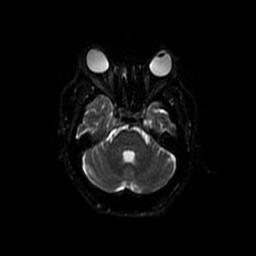
[im 38/94]
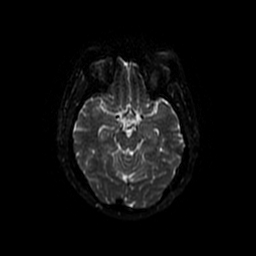
[im 47/94]
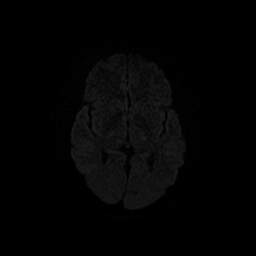
[im 56/94]
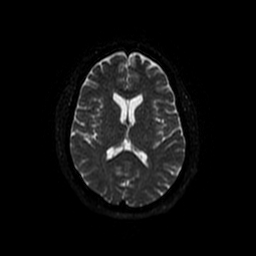
[im 66/94]
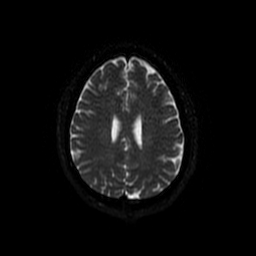
[im 75/94]
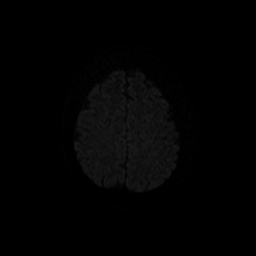
[im 84/94]
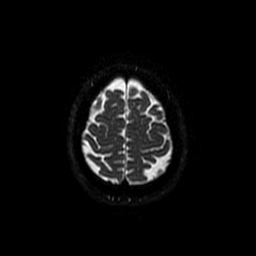
[im 94/94]
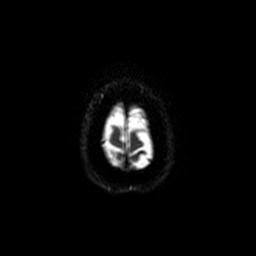

[Series 4: T2 · axial · 5.0mm · 0.47mm/px · z∈[-50,+86]mm · 2 of 24 slices shown (1 of 2)]
[im 1/24]
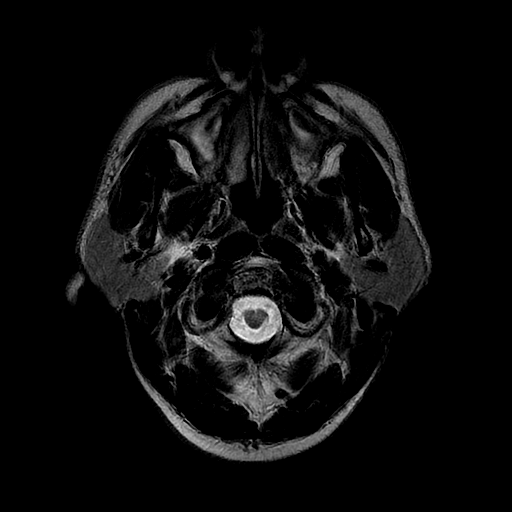
[im 24/24]
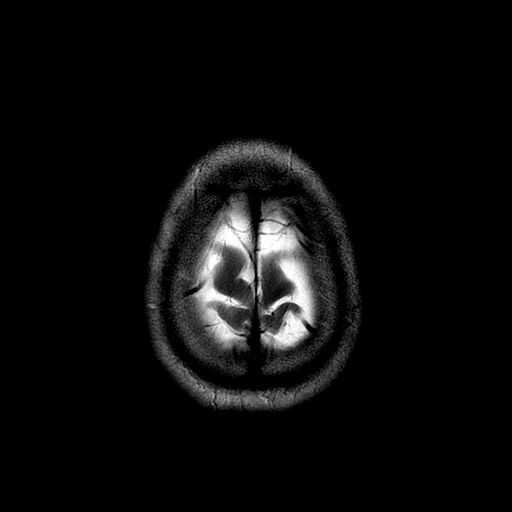

[Series 5: FLAIR · axial · 5.0mm · 0.47mm/px · z∈[-50,+86]mm · 2 of 24 slices shown]
[im 1/24]
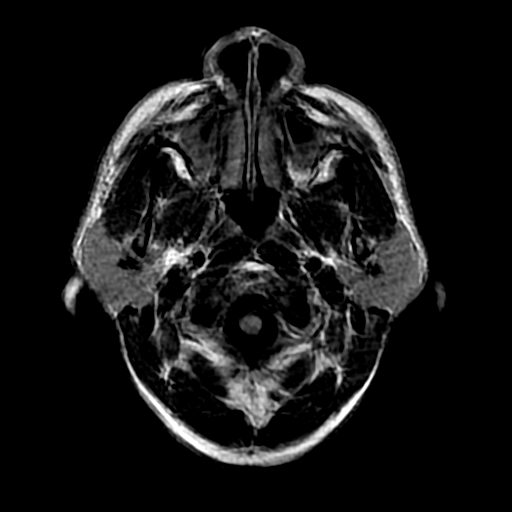
[im 24/24]
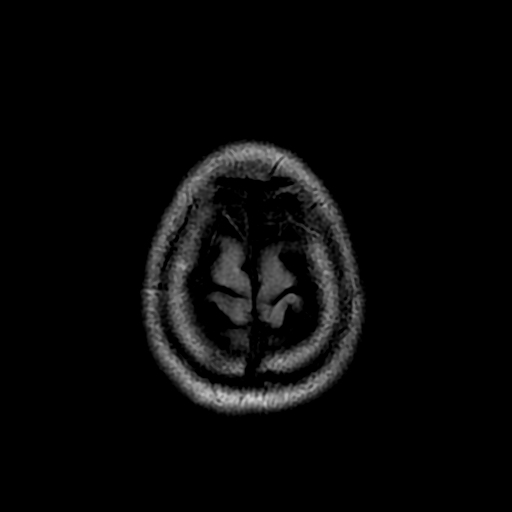

[Series 7: DWI · coronal · 5.0mm · 1.09mm/px · 7 of 70 slices shown (2 of 4)]
[im 1/70]
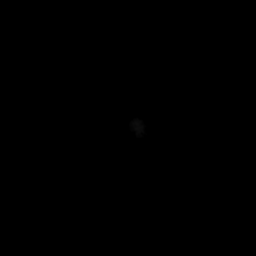
[im 12/70]
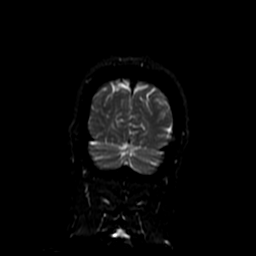
[im 24/70]
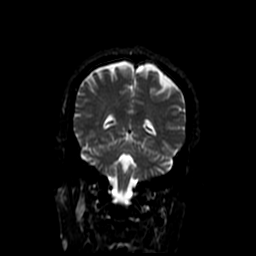
[im 35/70]
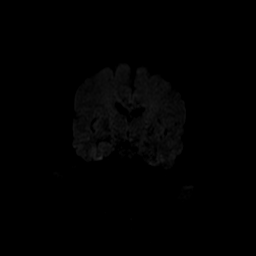
[im 47/70]
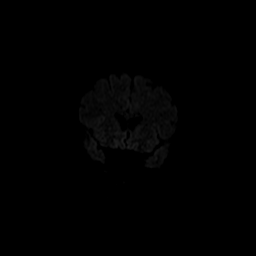
[im 58/70]
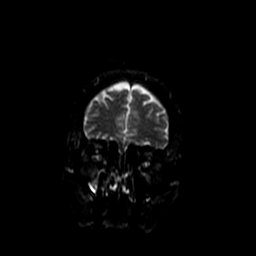
[im 70/70]
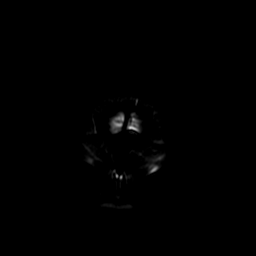

[Series 8: T2 · coronal · 5.0mm · 0.39mm/px · 3 of 27 slices shown (2 of 2)]
[im 1/27]
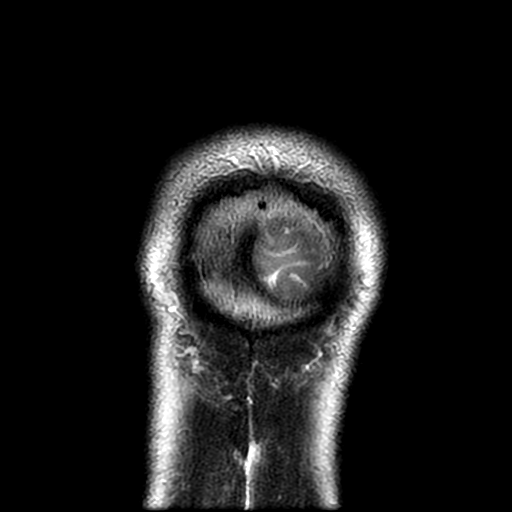
[im 14/27]
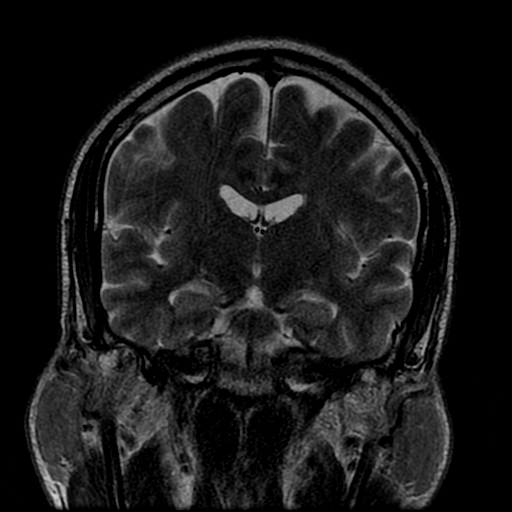
[im 27/27]
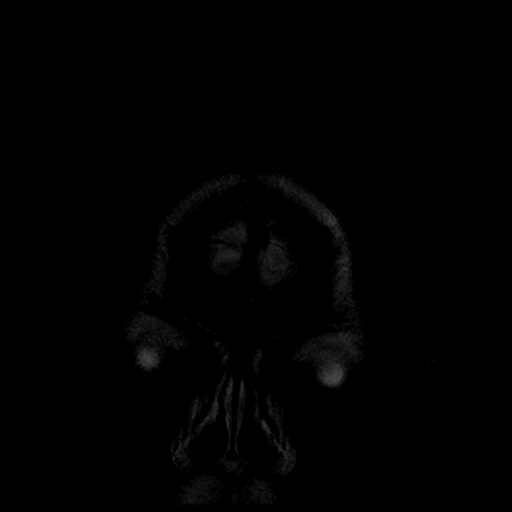

[Series 9: ax mpgr · axial · 5.0mm · 0.47mm/px · 1 of 24 slices shown]
[im 1/24]
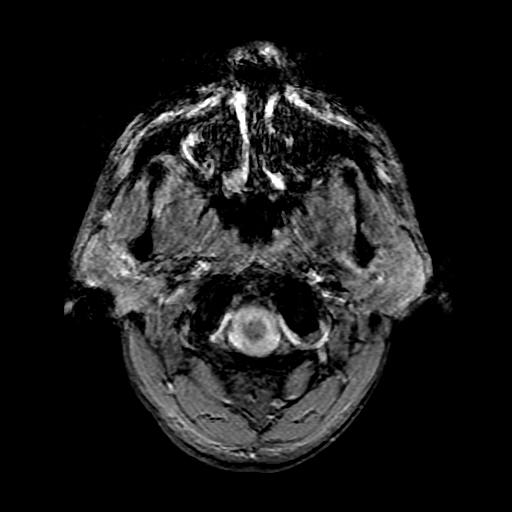

[Series 10: T1 · sagittal · 5.0mm · 0.51mm/px · 2 of 23 slices shown]
[im 1/23]
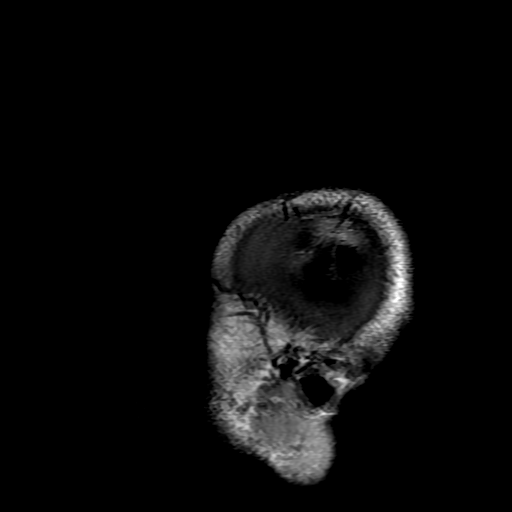
[im 23/23]
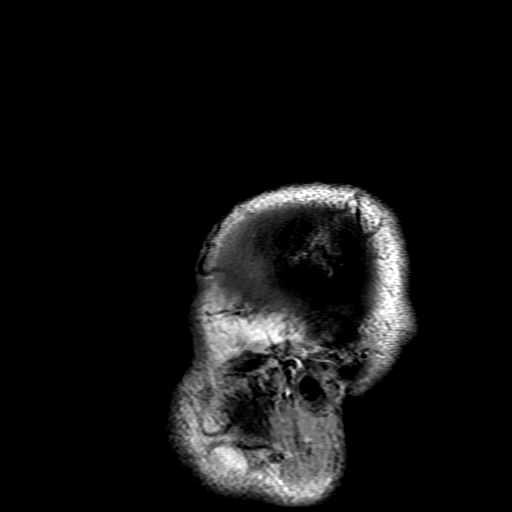

[Series 300: DWI · axial · 3.0mm · 1.09mm/px · z∈[-46,+91]mm · 5 of 47 slices shown (3 of 4)]
[im 1/47]
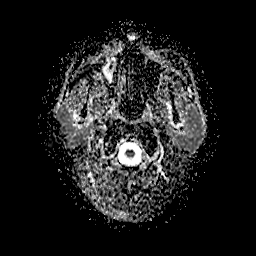
[im 12/47]
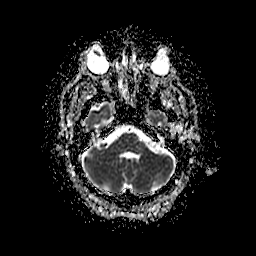
[im 24/47]
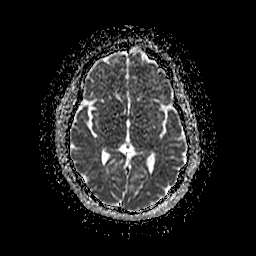
[im 35/47]
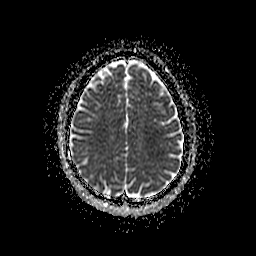
[im 47/47]
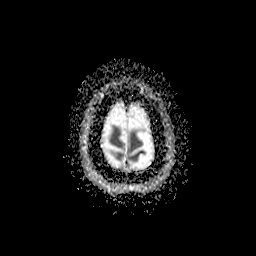

[Series 700: DWI · coronal · 5.0mm · 1.09mm/px · 4 of 35 slices shown (4 of 4)]
[im 1/35]
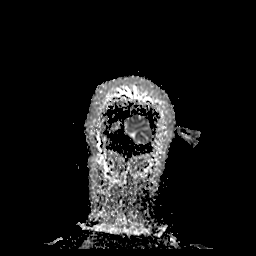
[im 12/35]
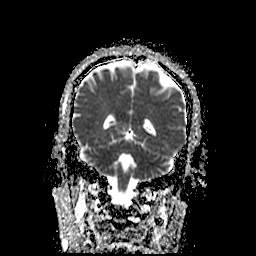
[im 23/35]
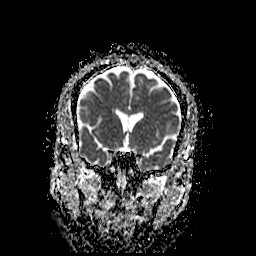
[im 35/35]
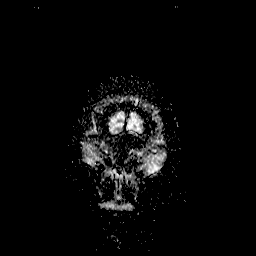

[37 of 48 positions shown; findings below may reference images not displayed]

FINDINGS: Brain: Cerebral volume within normal limits. Minimal T2/FLAIR
hyperintensity within the periventricular white matter, most likely
related chronic small vessel ischemic disease, felt to be within
normal limits for age.

No abnormal foci of restricted diffusion to suggest acute or
subacute ischemia. Gray-white matter differentiation maintained. No
evidence for acute or chronic intracranial hemorrhage. No
encephalomalacia to suggest chronic infarction.

No mass lesion, midline shift, or mass effect. No hydrocephalus. No
extra-axial fluid collection. Major dural sinuses are grossly
patent.

Pituitary gland and suprasellar region within normal limits.

Vascular: Major intracranial vascular flow voids are maintained.

Skull and upper cervical spine: Craniocervical junction normal.
Visualized upper cervical spine unremarkable without significant
degenerative changes are stenosis. Bone marrow signal intensity
within normal limits. No scalp soft tissue abnormality.

Sinuses/Orbits: Globes and orbital soft tissues within normal
limits.

Moderate mucosal thickening within the maxillary sinuses and
ethmoidal air cells. Paranasal sinuses are otherwise clear. No
mastoid effusion. Inner ear structures grossly normal.
IMPRESSION: 1. Normal brain MRI. No acute intracranial infarct or other process
identified.
2. Moderate maxillary and ethmoidal sinus disease, likely
allergic/inflammatory in nature.

## 2018-10-20 DIAGNOSIS — I313 Pericardial effusion (noninflammatory): Secondary | ICD-10-CM | POA: Diagnosis not present

## 2018-10-20 DIAGNOSIS — Z992 Dependence on renal dialysis: Secondary | ICD-10-CM | POA: Diagnosis not present

## 2018-10-20 DIAGNOSIS — R112 Nausea with vomiting, unspecified: Secondary | ICD-10-CM | POA: Diagnosis not present

## 2018-10-20 DIAGNOSIS — I10 Essential (primary) hypertension: Secondary | ICD-10-CM | POA: Diagnosis not present

## 2018-10-20 DIAGNOSIS — G4731 Primary central sleep apnea: Secondary | ICD-10-CM | POA: Diagnosis not present

## 2018-10-20 DIAGNOSIS — K861 Other chronic pancreatitis: Secondary | ICD-10-CM | POA: Diagnosis not present

## 2018-10-20 DIAGNOSIS — I82A11 Acute embolism and thrombosis of right axillary vein: Secondary | ICD-10-CM | POA: Diagnosis not present

## 2018-10-20 DIAGNOSIS — L03113 Cellulitis of right upper limb: Secondary | ICD-10-CM | POA: Diagnosis not present

## 2018-10-20 DIAGNOSIS — K219 Gastro-esophageal reflux disease without esophagitis: Secondary | ICD-10-CM | POA: Diagnosis not present

## 2018-10-20 DIAGNOSIS — N186 End stage renal disease: Secondary | ICD-10-CM | POA: Diagnosis not present

## 2018-10-21 DIAGNOSIS — I313 Pericardial effusion (noninflammatory): Secondary | ICD-10-CM | POA: Diagnosis not present

## 2018-10-21 DIAGNOSIS — K861 Other chronic pancreatitis: Secondary | ICD-10-CM | POA: Diagnosis not present

## 2018-10-21 DIAGNOSIS — G4731 Primary central sleep apnea: Secondary | ICD-10-CM | POA: Diagnosis not present

## 2018-10-21 DIAGNOSIS — R112 Nausea with vomiting, unspecified: Secondary | ICD-10-CM | POA: Diagnosis not present

## 2018-10-21 DIAGNOSIS — N186 End stage renal disease: Secondary | ICD-10-CM | POA: Diagnosis not present

## 2018-10-21 DIAGNOSIS — Z992 Dependence on renal dialysis: Secondary | ICD-10-CM | POA: Diagnosis not present

## 2018-10-21 DIAGNOSIS — T8610 Unspecified complication of kidney transplant: Secondary | ICD-10-CM | POA: Diagnosis not present

## 2018-10-21 DIAGNOSIS — L03113 Cellulitis of right upper limb: Secondary | ICD-10-CM | POA: Diagnosis not present

## 2018-10-21 DIAGNOSIS — I82A11 Acute embolism and thrombosis of right axillary vein: Secondary | ICD-10-CM | POA: Diagnosis not present

## 2018-10-21 DIAGNOSIS — I10 Essential (primary) hypertension: Secondary | ICD-10-CM | POA: Diagnosis not present

## 2018-10-21 DIAGNOSIS — K219 Gastro-esophageal reflux disease without esophagitis: Secondary | ICD-10-CM | POA: Diagnosis not present

## 2018-10-22 DIAGNOSIS — L03113 Cellulitis of right upper limb: Secondary | ICD-10-CM | POA: Diagnosis not present

## 2018-10-23 DIAGNOSIS — Z992 Dependence on renal dialysis: Secondary | ICD-10-CM | POA: Diagnosis not present

## 2018-10-23 DIAGNOSIS — R109 Unspecified abdominal pain: Secondary | ICD-10-CM | POA: Diagnosis not present

## 2018-10-23 DIAGNOSIS — N186 End stage renal disease: Secondary | ICD-10-CM | POA: Diagnosis not present

## 2018-10-23 DIAGNOSIS — L03113 Cellulitis of right upper limb: Secondary | ICD-10-CM | POA: Diagnosis not present

## 2018-10-24 DIAGNOSIS — L03113 Cellulitis of right upper limb: Secondary | ICD-10-CM | POA: Diagnosis not present

## 2018-10-25 DIAGNOSIS — L03113 Cellulitis of right upper limb: Secondary | ICD-10-CM | POA: Diagnosis not present

## 2018-10-26 DIAGNOSIS — L03113 Cellulitis of right upper limb: Secondary | ICD-10-CM | POA: Diagnosis not present

## 2018-10-26 DIAGNOSIS — R600 Localized edema: Secondary | ICD-10-CM | POA: Diagnosis not present

## 2018-10-26 DIAGNOSIS — Z992 Dependence on renal dialysis: Secondary | ICD-10-CM | POA: Diagnosis not present

## 2018-10-26 DIAGNOSIS — N186 End stage renal disease: Secondary | ICD-10-CM | POA: Diagnosis not present

## 2018-10-27 DIAGNOSIS — L03113 Cellulitis of right upper limb: Secondary | ICD-10-CM | POA: Diagnosis not present

## 2018-10-27 IMAGING — DX DG CHEST 2V
2 series · 2 of 2 positions shown · non-contrast
Comparison: May 17, 2016

CLINICAL DATA: Shortness of breath and chest pain

EXAM:
CHEST  2 VIEW

[chest pa]
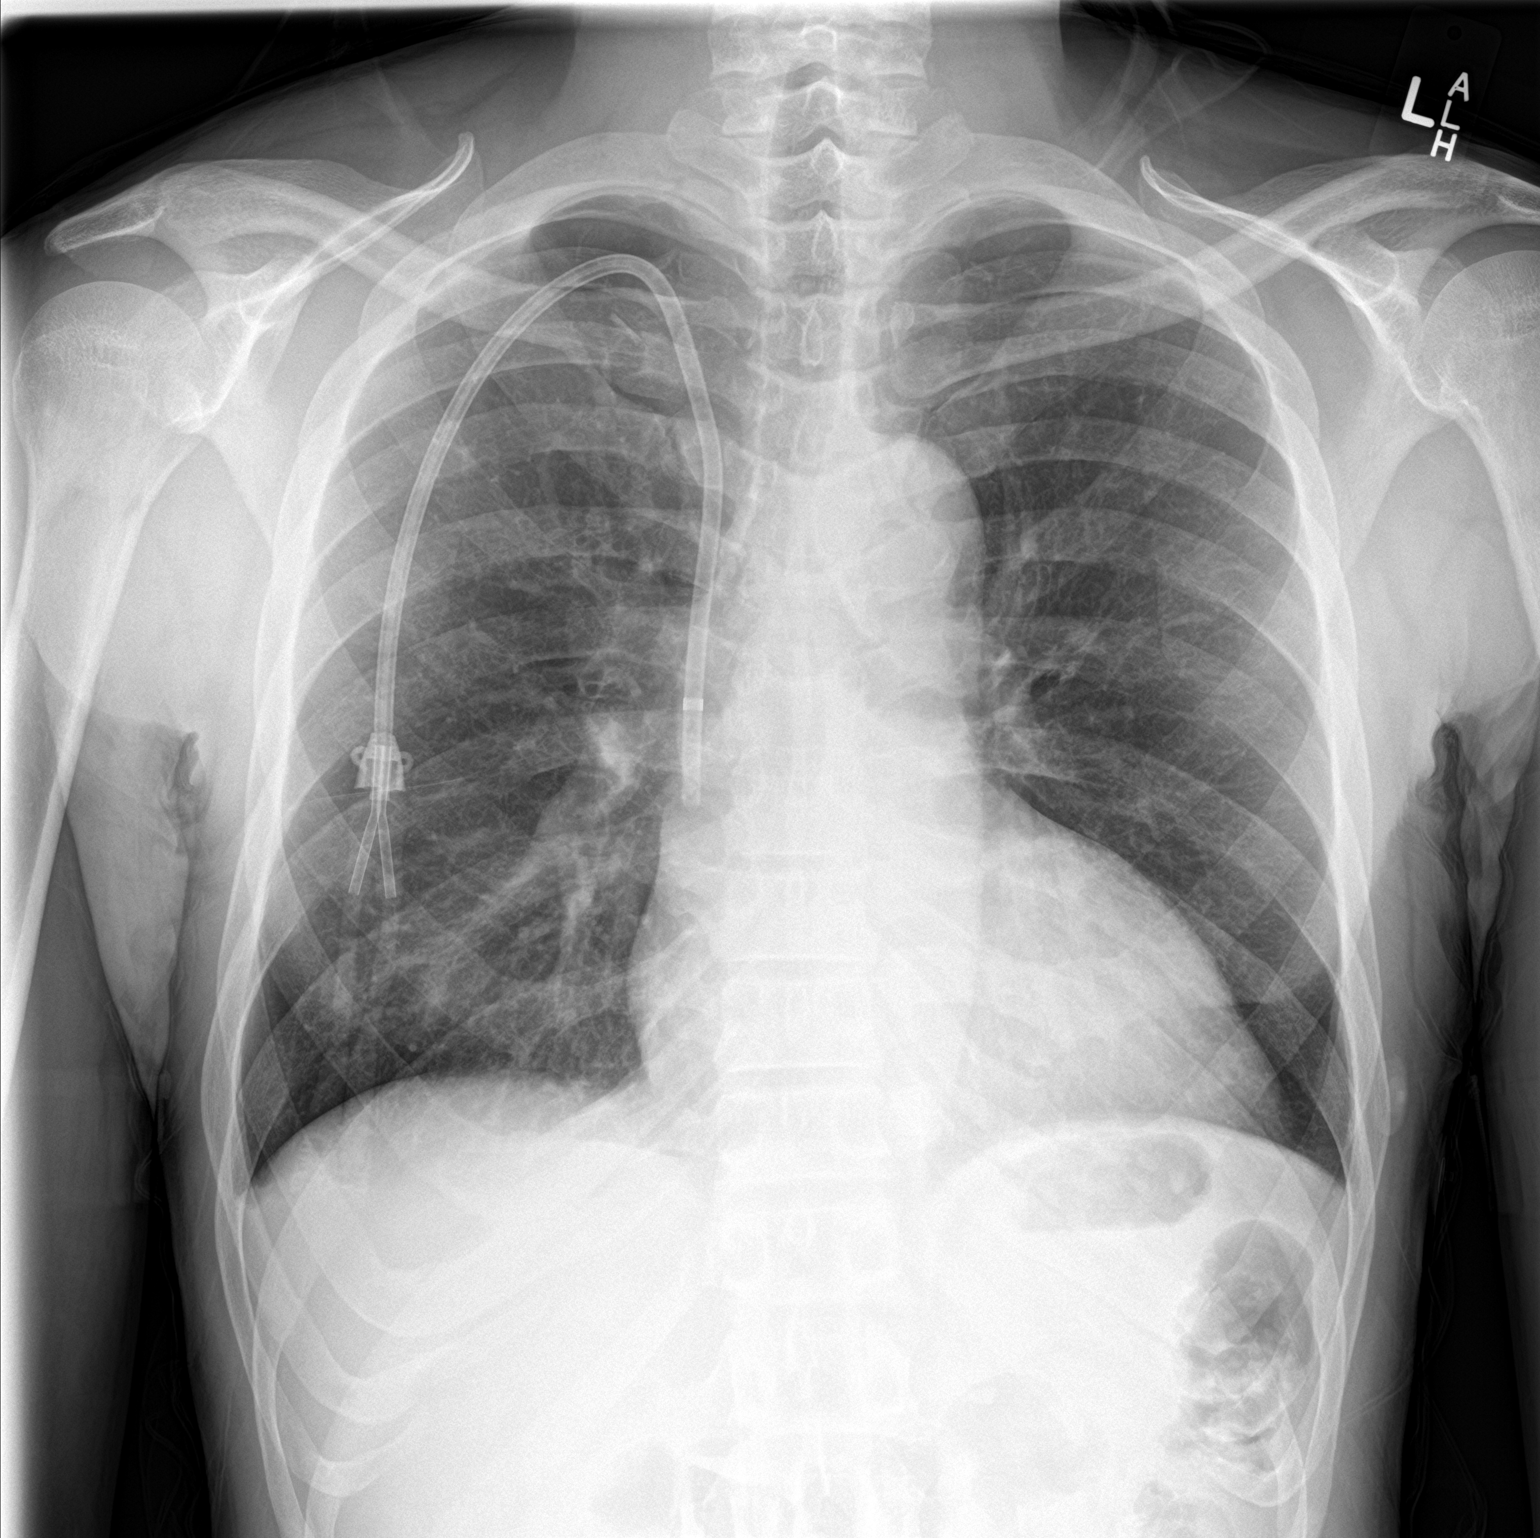

[chest lat]
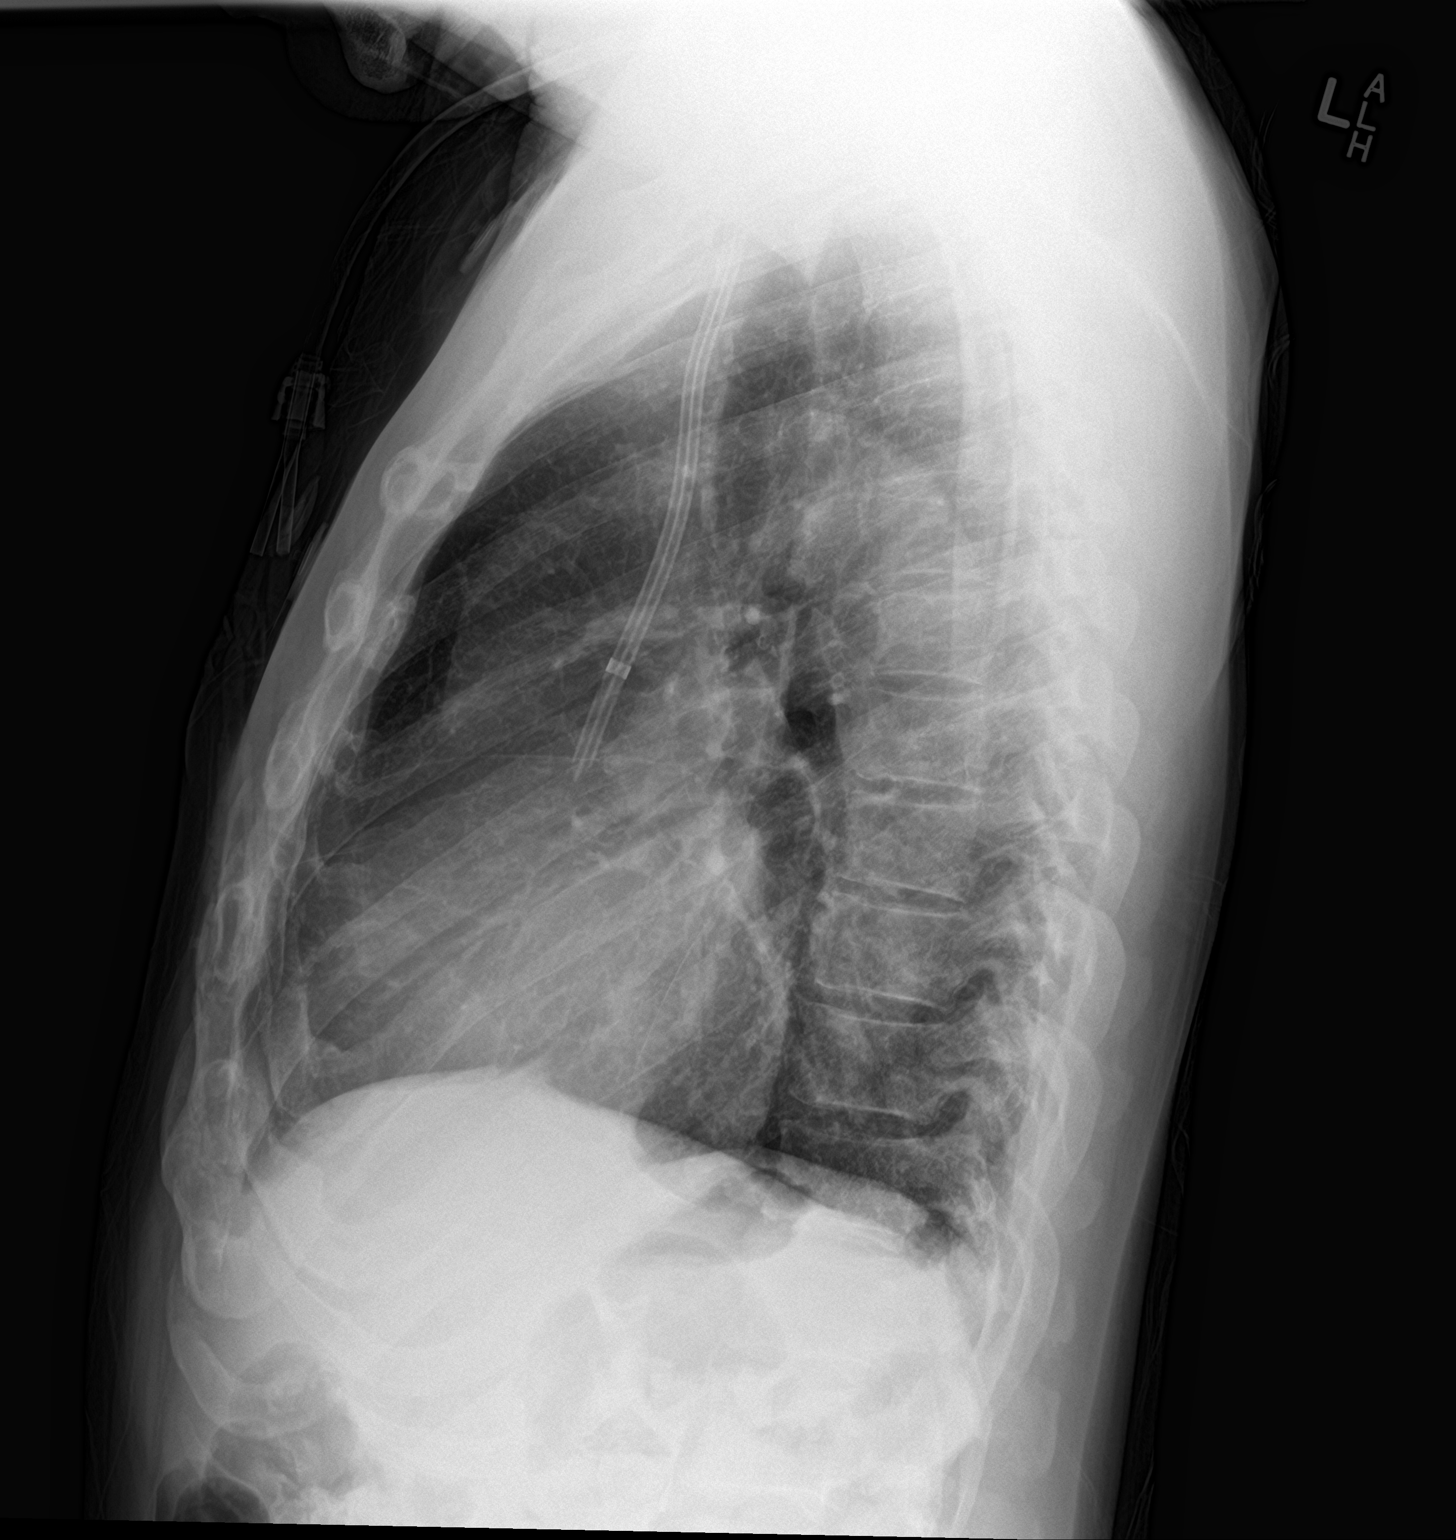

[2 of 2 positions shown; findings below may reference images not displayed]

FINDINGS: There is no edema or consolidation. Heart is borderline prominent
with pulmonary vascularity within normal limits. There is
atherosclerotic calcification aorta. No adenopathy. No bone lesions.
No pneumothorax. Central catheter tip is in the superior vena cava
near the cavoatrial junction.
IMPRESSION: Central catheter tip in superior vena cava near cavoatrial junction.
No pneumothorax. No edema or consolidation. Heart borderline
prominent.

## 2018-10-28 DIAGNOSIS — L03113 Cellulitis of right upper limb: Secondary | ICD-10-CM | POA: Diagnosis not present

## 2018-10-28 DIAGNOSIS — N186 End stage renal disease: Secondary | ICD-10-CM | POA: Diagnosis not present

## 2018-10-28 DIAGNOSIS — Z992 Dependence on renal dialysis: Secondary | ICD-10-CM | POA: Diagnosis not present

## 2018-10-29 DIAGNOSIS — Z992 Dependence on renal dialysis: Secondary | ICD-10-CM | POA: Diagnosis not present

## 2018-10-29 DIAGNOSIS — K861 Other chronic pancreatitis: Secondary | ICD-10-CM | POA: Diagnosis not present

## 2018-10-29 DIAGNOSIS — R112 Nausea with vomiting, unspecified: Secondary | ICD-10-CM | POA: Diagnosis not present

## 2018-10-29 DIAGNOSIS — I313 Pericardial effusion (noninflammatory): Secondary | ICD-10-CM | POA: Diagnosis not present

## 2018-10-29 DIAGNOSIS — G4731 Primary central sleep apnea: Secondary | ICD-10-CM | POA: Diagnosis not present

## 2018-10-29 DIAGNOSIS — I82A11 Acute embolism and thrombosis of right axillary vein: Secondary | ICD-10-CM | POA: Diagnosis not present

## 2018-10-29 DIAGNOSIS — K219 Gastro-esophageal reflux disease without esophagitis: Secondary | ICD-10-CM | POA: Diagnosis not present

## 2018-10-29 DIAGNOSIS — L03113 Cellulitis of right upper limb: Secondary | ICD-10-CM | POA: Diagnosis not present

## 2018-10-29 DIAGNOSIS — N186 End stage renal disease: Secondary | ICD-10-CM | POA: Diagnosis not present

## 2018-10-29 DIAGNOSIS — I10 Essential (primary) hypertension: Secondary | ICD-10-CM | POA: Diagnosis not present

## 2018-10-29 IMAGING — CR DG CHEST 2V
2 series · 2 of 2 positions shown · non-contrast
Comparison: Chest radiograph performed 06/07/2016

CLINICAL DATA: Acute onset of generalized chest pain and shortness
of breath. Initial encounter.

EXAM:
CHEST  2 VIEW

[chest lat]
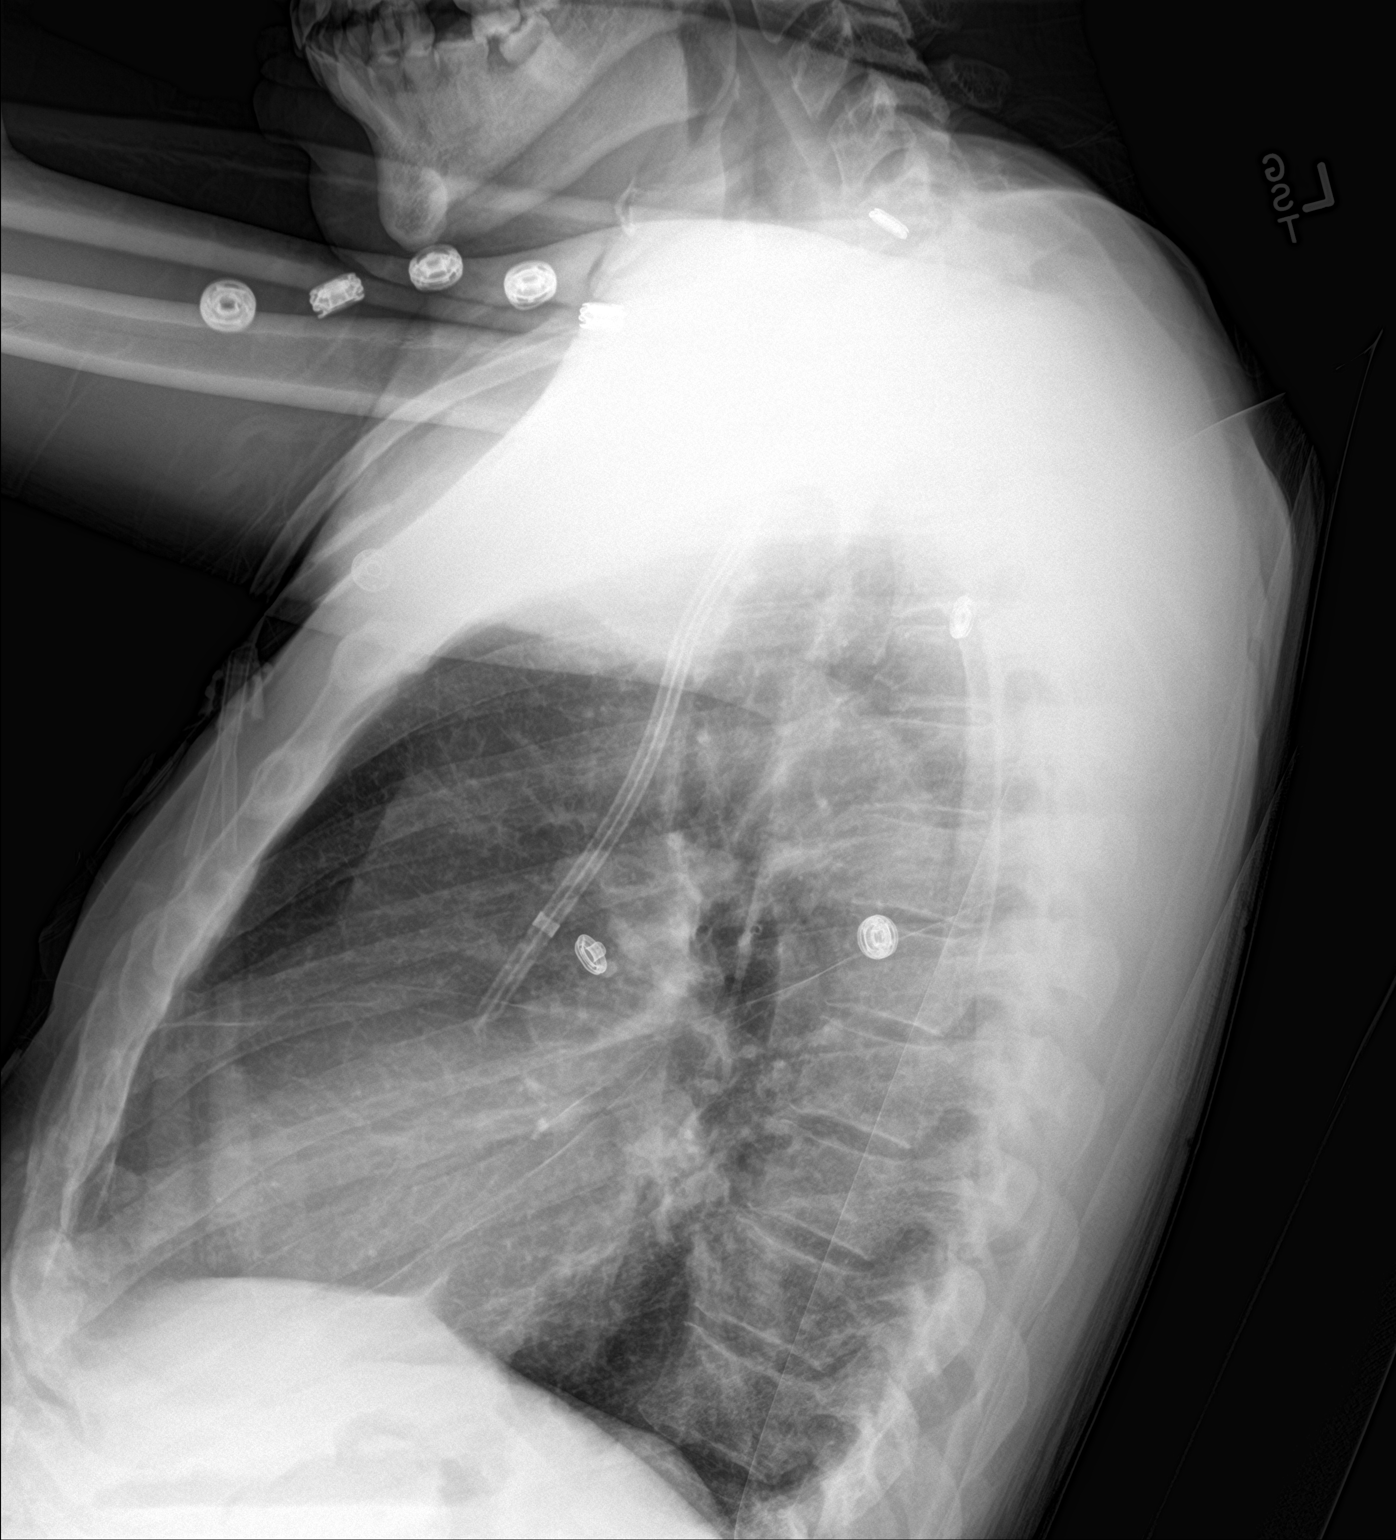

[chest ap]
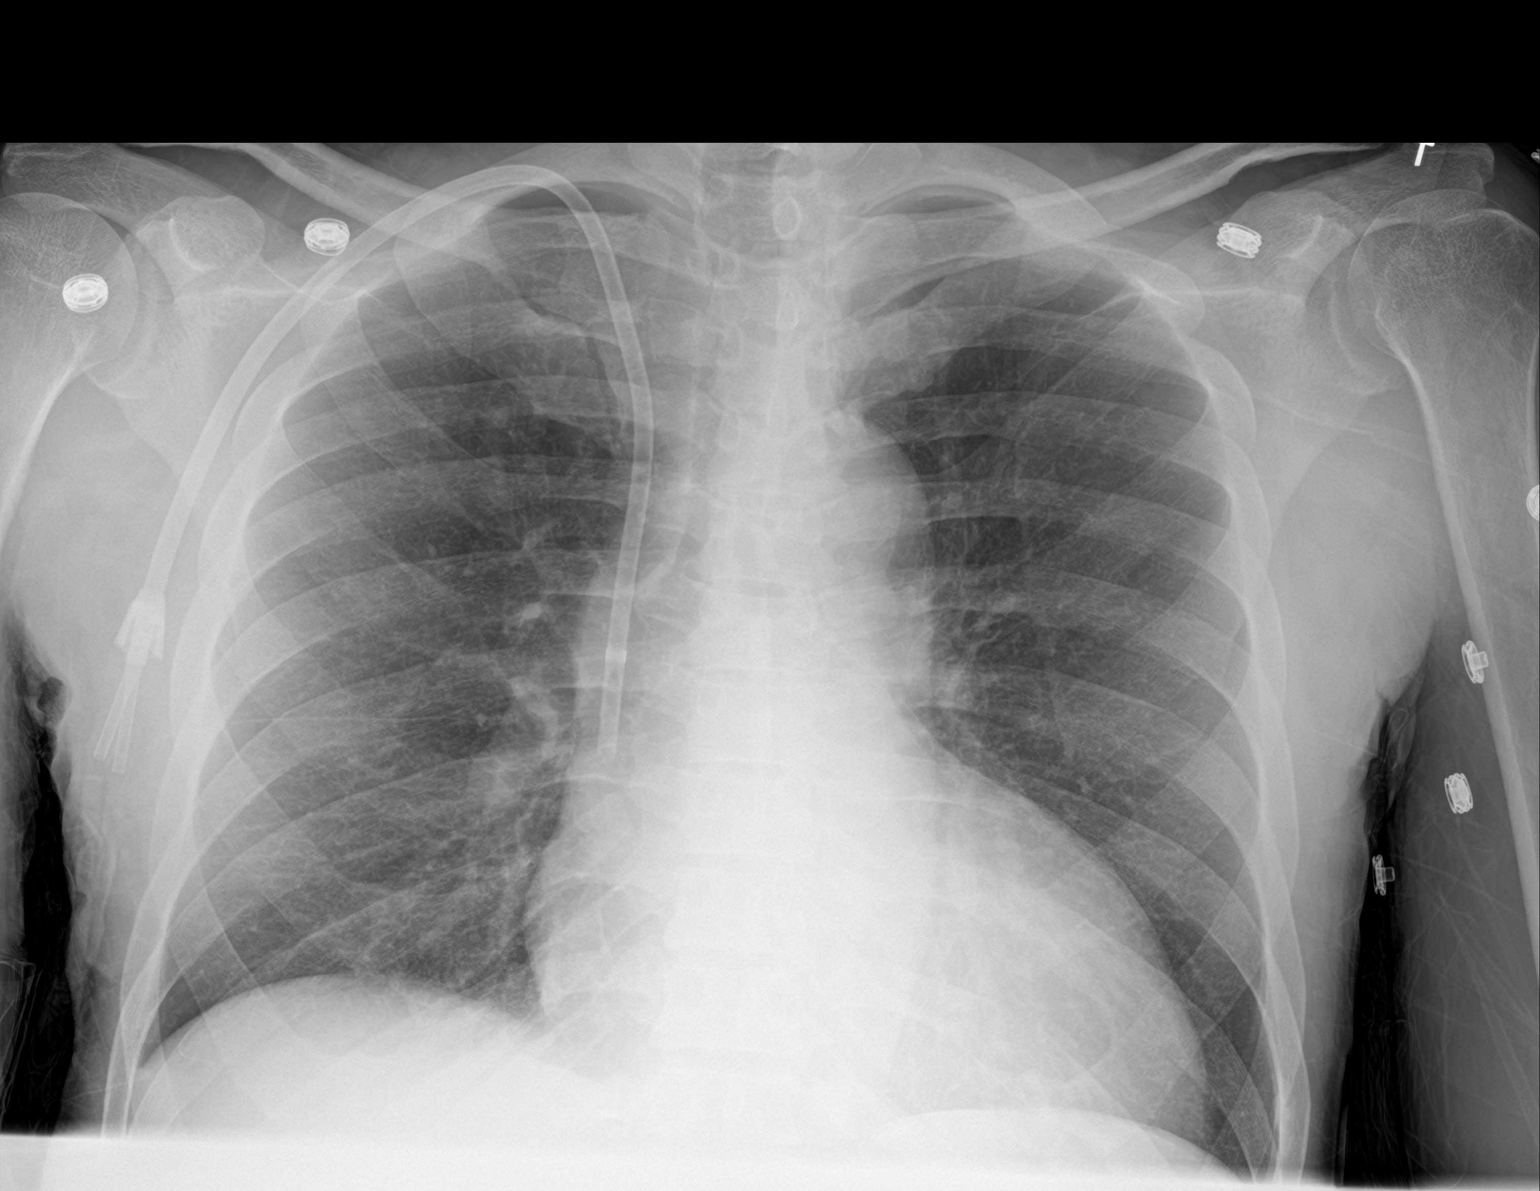

[2 of 2 positions shown; findings below may reference images not displayed]

FINDINGS: The lungs are well-aerated and clear. There is no evidence of focal
opacification, pleural effusion or pneumothorax.

The heart is mildly enlarged. No acute osseous abnormalities are
seen. A right-sided dual-lumen catheter is noted ending about the
distal SVC.
IMPRESSION: Mild cardiomegaly.  Lungs remain grossly clear.

## 2018-10-30 DIAGNOSIS — Z992 Dependence on renal dialysis: Secondary | ICD-10-CM | POA: Diagnosis not present

## 2018-10-30 DIAGNOSIS — N186 End stage renal disease: Secondary | ICD-10-CM | POA: Diagnosis not present

## 2018-10-30 DIAGNOSIS — K861 Other chronic pancreatitis: Secondary | ICD-10-CM | POA: Diagnosis not present

## 2018-10-30 DIAGNOSIS — I10 Essential (primary) hypertension: Secondary | ICD-10-CM | POA: Diagnosis not present

## 2018-10-30 DIAGNOSIS — K219 Gastro-esophageal reflux disease without esophagitis: Secondary | ICD-10-CM | POA: Diagnosis not present

## 2018-10-30 DIAGNOSIS — G4731 Primary central sleep apnea: Secondary | ICD-10-CM | POA: Diagnosis not present

## 2018-10-30 DIAGNOSIS — I82A11 Acute embolism and thrombosis of right axillary vein: Secondary | ICD-10-CM | POA: Diagnosis not present

## 2018-10-30 DIAGNOSIS — I313 Pericardial effusion (noninflammatory): Secondary | ICD-10-CM | POA: Diagnosis not present

## 2018-10-30 DIAGNOSIS — R112 Nausea with vomiting, unspecified: Secondary | ICD-10-CM | POA: Diagnosis not present

## 2018-10-30 DIAGNOSIS — L03113 Cellulitis of right upper limb: Secondary | ICD-10-CM | POA: Diagnosis not present

## 2018-10-31 DIAGNOSIS — I313 Pericardial effusion (noninflammatory): Secondary | ICD-10-CM | POA: Diagnosis not present

## 2018-10-31 DIAGNOSIS — N186 End stage renal disease: Secondary | ICD-10-CM | POA: Diagnosis not present

## 2018-10-31 DIAGNOSIS — K861 Other chronic pancreatitis: Secondary | ICD-10-CM | POA: Diagnosis not present

## 2018-10-31 DIAGNOSIS — Z992 Dependence on renal dialysis: Secondary | ICD-10-CM | POA: Diagnosis not present

## 2018-10-31 DIAGNOSIS — R112 Nausea with vomiting, unspecified: Secondary | ICD-10-CM | POA: Diagnosis not present

## 2018-10-31 DIAGNOSIS — I10 Essential (primary) hypertension: Secondary | ICD-10-CM | POA: Diagnosis not present

## 2018-10-31 DIAGNOSIS — L03113 Cellulitis of right upper limb: Secondary | ICD-10-CM | POA: Diagnosis not present

## 2018-10-31 DIAGNOSIS — G4731 Primary central sleep apnea: Secondary | ICD-10-CM | POA: Diagnosis not present

## 2018-10-31 DIAGNOSIS — I82A11 Acute embolism and thrombosis of right axillary vein: Secondary | ICD-10-CM | POA: Diagnosis not present

## 2018-10-31 DIAGNOSIS — K219 Gastro-esophageal reflux disease without esophagitis: Secondary | ICD-10-CM | POA: Diagnosis not present

## 2018-11-01 DIAGNOSIS — K219 Gastro-esophageal reflux disease without esophagitis: Secondary | ICD-10-CM | POA: Diagnosis not present

## 2018-11-01 DIAGNOSIS — Z992 Dependence on renal dialysis: Secondary | ICD-10-CM | POA: Diagnosis not present

## 2018-11-01 DIAGNOSIS — L03113 Cellulitis of right upper limb: Secondary | ICD-10-CM | POA: Diagnosis not present

## 2018-11-01 DIAGNOSIS — I10 Essential (primary) hypertension: Secondary | ICD-10-CM | POA: Diagnosis not present

## 2018-11-01 DIAGNOSIS — K861 Other chronic pancreatitis: Secondary | ICD-10-CM | POA: Diagnosis not present

## 2018-11-01 DIAGNOSIS — I82A11 Acute embolism and thrombosis of right axillary vein: Secondary | ICD-10-CM | POA: Diagnosis not present

## 2018-11-01 DIAGNOSIS — G4731 Primary central sleep apnea: Secondary | ICD-10-CM | POA: Diagnosis not present

## 2018-11-01 DIAGNOSIS — N186 End stage renal disease: Secondary | ICD-10-CM | POA: Diagnosis not present

## 2018-11-01 DIAGNOSIS — R112 Nausea with vomiting, unspecified: Secondary | ICD-10-CM | POA: Diagnosis not present

## 2018-11-01 DIAGNOSIS — I313 Pericardial effusion (noninflammatory): Secondary | ICD-10-CM | POA: Diagnosis not present

## 2018-11-02 DIAGNOSIS — K861 Other chronic pancreatitis: Secondary | ICD-10-CM | POA: Diagnosis not present

## 2018-11-02 DIAGNOSIS — I313 Pericardial effusion (noninflammatory): Secondary | ICD-10-CM | POA: Diagnosis not present

## 2018-11-02 DIAGNOSIS — R112 Nausea with vomiting, unspecified: Secondary | ICD-10-CM | POA: Diagnosis not present

## 2018-11-02 DIAGNOSIS — I10 Essential (primary) hypertension: Secondary | ICD-10-CM | POA: Diagnosis not present

## 2018-11-02 DIAGNOSIS — I82A11 Acute embolism and thrombosis of right axillary vein: Secondary | ICD-10-CM | POA: Diagnosis not present

## 2018-11-02 DIAGNOSIS — Z992 Dependence on renal dialysis: Secondary | ICD-10-CM | POA: Diagnosis not present

## 2018-11-02 DIAGNOSIS — K219 Gastro-esophageal reflux disease without esophagitis: Secondary | ICD-10-CM | POA: Diagnosis not present

## 2018-11-02 DIAGNOSIS — N186 End stage renal disease: Secondary | ICD-10-CM | POA: Diagnosis not present

## 2018-11-02 DIAGNOSIS — G4731 Primary central sleep apnea: Secondary | ICD-10-CM | POA: Diagnosis not present

## 2018-11-02 DIAGNOSIS — L03113 Cellulitis of right upper limb: Secondary | ICD-10-CM | POA: Diagnosis not present

## 2018-11-04 ENCOUNTER — Emergency Department (HOSPITAL_COMMUNITY): Payer: Medicare Other

## 2018-11-04 ENCOUNTER — Other Ambulatory Visit: Payer: Self-pay

## 2018-11-04 ENCOUNTER — Emergency Department (HOSPITAL_COMMUNITY)
Admission: EM | Admit: 2018-11-04 | Discharge: 2018-11-04 | Disposition: A | Discharge status: Home / Self Care | Payer: Medicare Other | Attending: Emergency Medicine | Admitting: Emergency Medicine

## 2018-11-04 ENCOUNTER — Encounter (HOSPITAL_COMMUNITY): Payer: Self-pay | Admitting: Emergency Medicine

## 2018-11-04 DIAGNOSIS — D631 Anemia in chronic kidney disease: Secondary | ICD-10-CM | POA: Diagnosis not present

## 2018-11-04 DIAGNOSIS — N186 End stage renal disease: Secondary | ICD-10-CM | POA: Diagnosis not present

## 2018-11-04 DIAGNOSIS — R1111 Vomiting without nausea: Secondary | ICD-10-CM | POA: Diagnosis not present

## 2018-11-04 DIAGNOSIS — E875 Hyperkalemia: Secondary | ICD-10-CM | POA: Diagnosis not present

## 2018-11-04 DIAGNOSIS — R112 Nausea with vomiting, unspecified: Secondary | ICD-10-CM

## 2018-11-04 DIAGNOSIS — R109 Unspecified abdominal pain: Secondary | ICD-10-CM | POA: Insufficient documentation

## 2018-11-04 DIAGNOSIS — Z992 Dependence on renal dialysis: Secondary | ICD-10-CM | POA: Insufficient documentation

## 2018-11-04 DIAGNOSIS — Z86711 Personal history of pulmonary embolism: Secondary | ICD-10-CM | POA: Insufficient documentation

## 2018-11-04 DIAGNOSIS — I10 Essential (primary) hypertension: Secondary | ICD-10-CM | POA: Diagnosis not present

## 2018-11-04 DIAGNOSIS — Z87891 Personal history of nicotine dependence: Secondary | ICD-10-CM | POA: Insufficient documentation

## 2018-11-04 DIAGNOSIS — I5042 Chronic combined systolic (congestive) and diastolic (congestive) heart failure: Secondary | ICD-10-CM | POA: Diagnosis not present

## 2018-11-04 DIAGNOSIS — G8929 Other chronic pain: Secondary | ICD-10-CM | POA: Insufficient documentation

## 2018-11-04 DIAGNOSIS — I132 Hypertensive heart and chronic kidney disease with heart failure and with stage 5 chronic kidney disease, or end stage renal disease: Secondary | ICD-10-CM | POA: Diagnosis not present

## 2018-11-04 DIAGNOSIS — J90 Pleural effusion, not elsewhere classified: Secondary | ICD-10-CM | POA: Diagnosis not present

## 2018-11-04 DIAGNOSIS — J984 Other disorders of lung: Secondary | ICD-10-CM | POA: Diagnosis not present

## 2018-11-04 DIAGNOSIS — N2581 Secondary hyperparathyroidism of renal origin: Secondary | ICD-10-CM | POA: Diagnosis not present

## 2018-11-04 DIAGNOSIS — I120 Hypertensive chronic kidney disease with stage 5 chronic kidney disease or end stage renal disease: Secondary | ICD-10-CM | POA: Diagnosis not present

## 2018-11-04 DIAGNOSIS — Z86718 Personal history of other venous thrombosis and embolism: Secondary | ICD-10-CM | POA: Insufficient documentation

## 2018-11-04 DIAGNOSIS — D509 Iron deficiency anemia, unspecified: Secondary | ICD-10-CM | POA: Diagnosis not present

## 2018-11-04 DIAGNOSIS — E1122 Type 2 diabetes mellitus with diabetic chronic kidney disease: Secondary | ICD-10-CM | POA: Diagnosis not present

## 2018-11-04 DIAGNOSIS — R197 Diarrhea, unspecified: Secondary | ICD-10-CM | POA: Insufficient documentation

## 2018-11-04 DIAGNOSIS — I517 Cardiomegaly: Secondary | ICD-10-CM | POA: Diagnosis not present

## 2018-11-04 DIAGNOSIS — R110 Nausea: Secondary | ICD-10-CM | POA: Diagnosis not present

## 2018-11-04 LAB — COMPREHENSIVE METABOLIC PANEL
ALT: 15 U/L (ref 0–44)
AST: 35 U/L (ref 15–41)
Albumin: 2.7 g/dL — ABNORMAL LOW (ref 3.5–5.0)
Alkaline Phosphatase: 141 U/L — ABNORMAL HIGH (ref 38–126)
Anion gap: 15 (ref 5–15)
BUN: 40 mg/dL — ABNORMAL HIGH (ref 6–20)
CO2: 25 mmol/L (ref 22–32)
Calcium: 8.7 mg/dL — ABNORMAL LOW (ref 8.9–10.3)
Chloride: 97 mmol/L — ABNORMAL LOW (ref 98–111)
Creatinine, Ser: 7.32 mg/dL — ABNORMAL HIGH (ref 0.61–1.24)
GFR calc Af Amer: 9 mL/min — ABNORMAL LOW (ref >60)
GFR calc non Af Amer: 8 mL/min — ABNORMAL LOW (ref >60)
Glucose, Bld: 80 mg/dL (ref 70–99)
Potassium: 4.7 mmol/L (ref 3.5–5.1)
Sodium: 137 mmol/L (ref 135–145)
Total Bilirubin: 0.7 mg/dL (ref 0.3–1.2)
Total Protein: 9 g/dL — ABNORMAL HIGH (ref 6.5–8.1)

## 2018-11-04 LAB — CBC WITH DIFFERENTIAL/PLATELET
Abs Immature Granulocytes: 0.03 10*3/uL (ref 0.00–0.07)
Basophils Absolute: 0 10*3/uL (ref 0.0–0.1)
Basophils Relative: 0 %
Eosinophils Absolute: 0.8 10*3/uL — ABNORMAL HIGH (ref 0.0–0.5)
Eosinophils Relative: 12 %
HCT: 27 % — ABNORMAL LOW (ref 39.0–52.0)
Hemoglobin: 8.9 g/dL — ABNORMAL LOW (ref 13.0–17.0)
Immature Granulocytes: 0 %
Lymphocytes Relative: 7 %
Lymphs Abs: 0.5 10*3/uL — ABNORMAL LOW (ref 0.7–4.0)
MCH: 27.2 pg (ref 26.0–34.0)
MCHC: 33 g/dL (ref 30.0–36.0)
MCV: 82.6 fL (ref 80.0–100.0)
Monocytes Absolute: 0.7 10*3/uL (ref 0.1–1.0)
Monocytes Relative: 10 %
Neutro Abs: 5.1 10*3/uL (ref 1.7–7.7)
Neutrophils Relative %: 71 %
Platelets: 189 10*3/uL (ref 150–400)
RBC: 3.27 MIL/uL — ABNORMAL LOW (ref 4.22–5.81)
RDW: 19 % — ABNORMAL HIGH (ref 11.5–15.5)
WBC: 7.2 10*3/uL (ref 4.0–10.5)
nRBC: 0 % (ref 0.0–0.2)

## 2018-11-04 LAB — LIPASE, BLOOD: Lipase: 38 U/L (ref 11–51)

## 2018-11-04 MED ORDER — RA SUPHEDRINE PE PO
5.00 | ORAL | Status: DC
Start: ? — End: 2018-11-04

## 2018-11-04 MED ORDER — DICYCLOMINE HCL 20 MG PO TABS
20.00 | ORAL_TABLET | ORAL | Status: DC
Start: 2018-11-03 — End: 2018-11-04

## 2018-11-04 MED ORDER — HYDRALAZINE HCL 50 MG PO TABS
100.00 | ORAL_TABLET | ORAL | Status: DC
Start: 2018-11-03 — End: 2018-11-04

## 2018-11-04 MED ORDER — HYDROMORPHONE HCL 1 MG/ML IJ SOLN
1.00 | INTRAMUSCULAR | Status: DC
Start: ? — End: 2018-11-04

## 2018-11-04 MED ORDER — AMLODIPINE BESYLATE 10 MG PO TABS
10.00 | ORAL_TABLET | ORAL | Status: DC
Start: 2018-11-03 — End: 2018-11-04

## 2018-11-04 MED ORDER — OXYCODONE HCL 5 MG PO TABS
10.0000 mg | ORAL_TABLET | Freq: Once | ORAL | Status: AC
  Administered 2018-11-04: 10 mg via ORAL
  Filled 2018-11-04: qty 2

## 2018-11-04 MED ORDER — NITROGLYCERIN 0.4 MG SL SUBL
0.40 | SUBLINGUAL_TABLET | SUBLINGUAL | Status: DC
Start: ? — End: 2018-11-04

## 2018-11-04 MED ORDER — ALBUMIN HUMAN 25 % IV SOLN
12.50 | INTRAVENOUS | Status: DC
Start: ? — End: 2018-11-04

## 2018-11-04 MED ORDER — EQUATE NICOTINE 4 MG MT GUM
4.00 | CHEWING_GUM | OROMUCOSAL | Status: DC
Start: ? — End: 2018-11-04

## 2018-11-04 MED ORDER — PROCHLORPERAZINE EDISYLATE 10 MG/2ML IJ SOLN
10.0000 mg | Freq: Once | INTRAMUSCULAR | Status: AC
  Administered 2018-11-04: 10 mg via INTRAMUSCULAR
  Filled 2018-11-04: qty 2

## 2018-11-04 MED ORDER — NEPHRO-VITE 0.8 MG PO TABS
1.00 | ORAL_TABLET | ORAL | Status: DC
Start: 2018-11-04 — End: 2018-11-04

## 2018-11-04 MED ORDER — HYDRALAZINE HCL 20 MG/ML IJ SOLN
10.00 | INTRAMUSCULAR | Status: DC
Start: ? — End: 2018-11-04

## 2018-11-04 MED ORDER — BUSPIRONE HCL 5 MG PO TABS
5.00 | ORAL_TABLET | ORAL | Status: DC
Start: 2018-11-03 — End: 2018-11-04

## 2018-11-04 MED ORDER — SODIUM CHLORIDE (PF) 0.9 % IJ SOLN
50.00 | INTRAMUSCULAR | Status: DC
Start: ? — End: 2018-11-04

## 2018-11-04 MED ORDER — VORICONAZOLE 200 MG IV SOLR
5.00 | INTRAVENOUS | Status: DC
Start: ? — End: 2018-11-04

## 2018-11-04 MED ORDER — SODIUM CHLORIDE 0.9 % IV SOLN
150.00 | INTRAVENOUS | Status: DC
Start: ? — End: 2018-11-04

## 2018-11-04 MED ORDER — SODIUM THIOSULFATE 25 % IV SOLN
25.00 | INTRAVENOUS | Status: DC
Start: 2018-11-04 — End: 2018-11-04

## 2018-11-04 MED ORDER — APIXABAN 5 MG PO TABS
5.00 | ORAL_TABLET | ORAL | Status: DC
Start: 2018-11-03 — End: 2018-11-04

## 2018-11-04 MED ORDER — CINACALCET HCL 30 MG PO TABS
60.00 | ORAL_TABLET | ORAL | Status: DC
Start: 2018-11-04 — End: 2018-11-04

## 2018-11-04 MED ORDER — ONDANSETRON 4 MG PO TBDP
4.00 | ORAL_TABLET | ORAL | Status: DC
Start: ? — End: 2018-11-04

## 2018-11-04 MED ORDER — SODIUM CHLORIDE 0.9 % IV SOLN
10.00 | INTRAVENOUS | Status: DC
Start: ? — End: 2018-11-04

## 2018-11-04 MED ORDER — PANCRELIPASE (LIP-PROT-AMYL) 12000-38000 UNITS PO CPEP
12000.00 | ORAL_CAPSULE | ORAL | Status: DC
Start: 2018-11-03 — End: 2018-11-04

## 2018-11-04 MED ORDER — PANTOPRAZOLE SODIUM 40 MG PO TBEC
40.00 | DELAYED_RELEASE_TABLET | ORAL | Status: DC
Start: 2018-11-03 — End: 2018-11-04

## 2018-11-04 MED ORDER — DIPHENHYDRAMINE HCL 25 MG PO CAPS
25.00 | ORAL_CAPSULE | ORAL | Status: DC
Start: ? — End: 2018-11-04

## 2018-11-04 MED ORDER — CARVEDILOL 6.25 MG PO TABS
6.25 | ORAL_TABLET | ORAL | Status: DC
Start: 2018-11-03 — End: 2018-11-04

## 2018-11-04 MED ORDER — PROMETHAZINE HCL 25 MG/ML IJ SOLN
12.50 | INTRAMUSCULAR | Status: DC
Start: ? — End: 2018-11-04

## 2018-11-04 MED ORDER — ZOTEX GPX PO
12.50 | ORAL | Status: DC
Start: ? — End: 2018-11-04

## 2018-11-04 MED ORDER — OXYCODONE HCL 5 MG PO TABS
5.0000 mg | ORAL_TABLET | Freq: Once | ORAL | Status: AC
  Administered 2018-11-04: 5 mg via ORAL
  Filled 2018-11-04: qty 1

## 2018-11-04 MED ORDER — GENERIC EXTERNAL MEDICATION
Status: DC
Start: 2018-11-03 — End: 2018-11-04

## 2018-11-04 MED ORDER — HYDROMORPHONE HCL 2 MG PO TABS
6.00 | ORAL_TABLET | ORAL | Status: DC
Start: ? — End: 2018-11-04

## 2018-11-04 NOTE — ED Notes (Addendum)
Multiple attempts made by phlebotomist, and this RN to get pt's blood, pt refusing to let us use any of his arms and requesting to have IJ placed by provider, provider notified. Dr. Dayna Barker oriented pt about the need for this labs, pt verbalized understanding. As soon as provider leave the room pt again started refusing care and let us get his blood.

## 2018-11-04 NOTE — ED Notes (Signed)
Pt no complaint with POC, refusing to have monitor on or keep frequent VS completed, pt demanding his care on his own way, provider to be notified.

## 2018-11-04 NOTE — Discharge Instructions (Addendum)
Continue with your current medications.  Follow-up for dialysis today as scheduled.  Return as needed.  Today's work-up without any significant findings.
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
## 2018-11-04 NOTE — ED Triage Notes (Signed)
Pt brought to ED by [facility] from home for nausea, vomiting and diarrhea, per ems pt is having increase fatigue since yesterday, pt to have HD today. VS for EMS T-101, R 18, BP 200/110, HR 88, SPO2 100%. 4 mg of Zofran given by EMS pta to ED

## 2018-11-04 NOTE — ED Provider Notes (Signed)
Patient's labs without significant changes from his baseline.  Patient keeping fluids down.  No further vomiting.  Patient scheduled for dialysis later today.  Will discharge to have that done.  Patient with a known history of chronic abdominal pain dialysis patient normally dialyzed Monday Wednesdays and Friday was dialyzed on Monday.  Acute abdominal series without significant abnormalities.  Patient stable for discharge home.  Patient with improved pain control here.  No further vomiting.   Fredia Sorrow, MD 11/04/18 [phone]

## 2018-11-04 NOTE — ED Notes (Signed)
Pt leaning over bedside table (sleeping) stating that position is less painful in his abd than laying down.

## 2018-11-06 DIAGNOSIS — M545 Low back pain: Secondary | ICD-10-CM | POA: Diagnosis not present

## 2018-11-06 DIAGNOSIS — G8929 Other chronic pain: Secondary | ICD-10-CM | POA: Diagnosis not present

## 2018-11-06 DIAGNOSIS — Z79899 Other long term (current) drug therapy: Secondary | ICD-10-CM | POA: Diagnosis not present

## 2018-11-07 ENCOUNTER — Emergency Department (HOSPITAL_COMMUNITY)
Admission: EM | Admit: 2018-11-07 | Discharge: 2018-11-07 | Discharge status: Absent without leave | Payer: Medicare Other

## 2018-11-09 DIAGNOSIS — M545 Low back pain: Secondary | ICD-10-CM | POA: Diagnosis not present

## 2018-11-09 DIAGNOSIS — D631 Anemia in chronic kidney disease: Secondary | ICD-10-CM | POA: Diagnosis not present

## 2018-11-09 DIAGNOSIS — N2581 Secondary hyperparathyroidism of renal origin: Secondary | ICD-10-CM | POA: Diagnosis not present

## 2018-11-09 DIAGNOSIS — Z992 Dependence on renal dialysis: Secondary | ICD-10-CM | POA: Diagnosis not present

## 2018-11-09 DIAGNOSIS — Z79899 Other long term (current) drug therapy: Secondary | ICD-10-CM | POA: Diagnosis not present

## 2018-11-09 DIAGNOSIS — G894 Chronic pain syndrome: Secondary | ICD-10-CM | POA: Diagnosis not present

## 2018-11-09 DIAGNOSIS — E875 Hyperkalemia: Secondary | ICD-10-CM | POA: Diagnosis not present

## 2018-11-09 DIAGNOSIS — D509 Iron deficiency anemia, unspecified: Secondary | ICD-10-CM | POA: Diagnosis not present

## 2018-11-09 DIAGNOSIS — N186 End stage renal disease: Secondary | ICD-10-CM | POA: Diagnosis not present

## 2018-11-09 DIAGNOSIS — G8929 Other chronic pain: Secondary | ICD-10-CM | POA: Diagnosis not present

## 2018-11-11 DIAGNOSIS — D509 Iron deficiency anemia, unspecified: Secondary | ICD-10-CM | POA: Diagnosis not present

## 2018-11-11 DIAGNOSIS — Z86718 Personal history of other venous thrombosis and embolism: Secondary | ICD-10-CM | POA: Diagnosis not present

## 2018-11-11 DIAGNOSIS — E875 Hyperkalemia: Secondary | ICD-10-CM | POA: Diagnosis not present

## 2018-11-11 DIAGNOSIS — D631 Anemia in chronic kidney disease: Secondary | ICD-10-CM | POA: Diagnosis not present

## 2018-11-11 DIAGNOSIS — N186 End stage renal disease: Secondary | ICD-10-CM | POA: Diagnosis not present

## 2018-11-11 DIAGNOSIS — E1122 Type 2 diabetes mellitus with diabetic chronic kidney disease: Secondary | ICD-10-CM | POA: Diagnosis not present

## 2018-11-11 DIAGNOSIS — N2581 Secondary hyperparathyroidism of renal origin: Secondary | ICD-10-CM | POA: Diagnosis not present

## 2018-11-11 DIAGNOSIS — Z992 Dependence on renal dialysis: Secondary | ICD-10-CM | POA: Diagnosis not present

## 2018-11-13 DIAGNOSIS — N186 End stage renal disease: Secondary | ICD-10-CM | POA: Diagnosis not present

## 2018-11-13 DIAGNOSIS — N2581 Secondary hyperparathyroidism of renal origin: Secondary | ICD-10-CM | POA: Diagnosis not present

## 2018-11-13 DIAGNOSIS — Z992 Dependence on renal dialysis: Secondary | ICD-10-CM | POA: Diagnosis not present

## 2018-11-13 DIAGNOSIS — D631 Anemia in chronic kidney disease: Secondary | ICD-10-CM | POA: Diagnosis not present

## 2018-11-13 DIAGNOSIS — D509 Iron deficiency anemia, unspecified: Secondary | ICD-10-CM | POA: Diagnosis not present

## 2018-11-13 DIAGNOSIS — E875 Hyperkalemia: Secondary | ICD-10-CM | POA: Diagnosis not present

## 2018-11-14 ENCOUNTER — Other Ambulatory Visit: Payer: Self-pay

## 2018-11-14 ENCOUNTER — Encounter (HOSPITAL_COMMUNITY): Payer: Self-pay | Admitting: Emergency Medicine

## 2018-11-14 ENCOUNTER — Emergency Department (HOSPITAL_COMMUNITY)
Admission: EM | Admit: 2018-11-14 | Discharge: 2018-11-14 | Disposition: A | Discharge status: Home / Self Care | Payer: Medicare Other | Attending: Emergency Medicine | Admitting: Emergency Medicine

## 2018-11-14 DIAGNOSIS — I5042 Chronic combined systolic (congestive) and diastolic (congestive) heart failure: Secondary | ICD-10-CM | POA: Insufficient documentation

## 2018-11-14 DIAGNOSIS — N186 End stage renal disease: Secondary | ICD-10-CM | POA: Insufficient documentation

## 2018-11-14 DIAGNOSIS — I132 Hypertensive heart and chronic kidney disease with heart failure and with stage 5 chronic kidney disease, or end stage renal disease: Secondary | ICD-10-CM | POA: Insufficient documentation

## 2018-11-14 DIAGNOSIS — Z87891 Personal history of nicotine dependence: Secondary | ICD-10-CM | POA: Diagnosis not present

## 2018-11-14 DIAGNOSIS — Z79899 Other long term (current) drug therapy: Secondary | ICD-10-CM | POA: Insufficient documentation

## 2018-11-14 DIAGNOSIS — R1013 Epigastric pain: Secondary | ICD-10-CM | POA: Insufficient documentation

## 2018-11-14 DIAGNOSIS — Z86718 Personal history of other venous thrombosis and embolism: Secondary | ICD-10-CM | POA: Insufficient documentation

## 2018-11-14 DIAGNOSIS — R112 Nausea with vomiting, unspecified: Secondary | ICD-10-CM | POA: Insufficient documentation

## 2018-11-14 DIAGNOSIS — E1122 Type 2 diabetes mellitus with diabetic chronic kidney disease: Secondary | ICD-10-CM | POA: Insufficient documentation

## 2018-11-14 DIAGNOSIS — Z992 Dependence on renal dialysis: Secondary | ICD-10-CM | POA: Diagnosis not present

## 2018-11-14 DIAGNOSIS — R110 Nausea: Secondary | ICD-10-CM | POA: Diagnosis not present

## 2018-11-14 DIAGNOSIS — Z86711 Personal history of pulmonary embolism: Secondary | ICD-10-CM | POA: Insufficient documentation

## 2018-11-14 MED ORDER — OXYCODONE HCL 5 MG PO TABS
10.0000 mg | ORAL_TABLET | Freq: Once | ORAL | Status: AC
  Administered 2018-11-14: 10 mg via ORAL
  Filled 2018-11-14: qty 2

## 2018-11-14 MED ORDER — PROCHLORPERAZINE EDISYLATE 10 MG/2ML IJ SOLN
10.0000 mg | Freq: Once | INTRAMUSCULAR | Status: AC
  Administered 2018-11-14: 10 mg via INTRAMUSCULAR
  Filled 2018-11-14: qty 2
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
## 2018-11-14 NOTE — ED Notes (Signed)
BILATERAL ARM RESTRICTION**  Old fistula on left + new fistula on right arm

## 2018-11-14 NOTE — ED Notes (Signed)
Patient verbalizes understanding of discharge instructions. Opportunity for questioning and answers were provided. Armband removed by staff, pt discharged from ED by wheelchair. Pt stated his friend was coming to pick him up

## 2018-11-14 NOTE — ED Notes (Addendum)
Attempted to get pt's BP from leg due to bilateral arm restrictions. Pt refusing to remove any clothing in order to put on gown. Automatic BP obtained from right leg.

## 2018-11-14 NOTE — ED Notes (Signed)
Gave patient apple juice and water

## 2018-11-14 NOTE — ED Triage Notes (Addendum)
Pt to triage via [facility].  Reports pt was picked up from [name]).  Hotel staff reported pt had been outside walking around.  Pt was in hotel room on EMS arrival.  Reports nausea and vomiting since 11pm.  EMS reports room was filled with take out containers.  No active vomiting with EMS.  Pt told EMS he was unable to dress himself so they had to help dress him.  Pt reports generalized abd pain x 2 days.  Refusing blood draw at triage. Pt has bilateral arm restrictions.

## 2018-11-14 NOTE — ED Notes (Signed)
Pt tolerated juice and water well. Stood in hallway saying that he was going to leave within the next 5 minutes because his ride was coming for him, and that he "was leaving either way".

## 2018-11-15 ENCOUNTER — Emergency Department (HOSPITAL_COMMUNITY): Payer: Medicare Other

## 2018-11-15 ENCOUNTER — Encounter (HOSPITAL_COMMUNITY): Payer: Self-pay | Admitting: *Deleted

## 2018-11-15 ENCOUNTER — Emergency Department (HOSPITAL_COMMUNITY)
Admission: EM | Admit: 2018-11-15 | Discharge: 2018-11-15 | Disposition: A | Discharge status: Home / Self Care | Payer: Medicare Other | Attending: Emergency Medicine | Admitting: Emergency Medicine

## 2018-11-15 ENCOUNTER — Other Ambulatory Visit: Payer: Self-pay

## 2018-11-15 DIAGNOSIS — R9431 Abnormal electrocardiogram [ECG] [EKG]: Secondary | ICD-10-CM | POA: Diagnosis not present

## 2018-11-15 DIAGNOSIS — R109 Unspecified abdominal pain: Secondary | ICD-10-CM | POA: Diagnosis present

## 2018-11-15 DIAGNOSIS — I5042 Chronic combined systolic (congestive) and diastolic (congestive) heart failure: Secondary | ICD-10-CM | POA: Diagnosis not present

## 2018-11-15 DIAGNOSIS — Z87891 Personal history of nicotine dependence: Secondary | ICD-10-CM | POA: Diagnosis not present

## 2018-11-15 DIAGNOSIS — R110 Nausea: Secondary | ICD-10-CM | POA: Diagnosis not present

## 2018-11-15 DIAGNOSIS — R112 Nausea with vomiting, unspecified: Secondary | ICD-10-CM

## 2018-11-15 DIAGNOSIS — R1111 Vomiting without nausea: Secondary | ICD-10-CM | POA: Diagnosis not present

## 2018-11-15 DIAGNOSIS — J90 Pleural effusion, not elsewhere classified: Secondary | ICD-10-CM | POA: Diagnosis not present

## 2018-11-15 DIAGNOSIS — I517 Cardiomegaly: Secondary | ICD-10-CM | POA: Diagnosis not present

## 2018-11-15 DIAGNOSIS — I132 Hypertensive heart and chronic kidney disease with heart failure and with stage 5 chronic kidney disease, or end stage renal disease: Secondary | ICD-10-CM | POA: Diagnosis not present

## 2018-11-15 DIAGNOSIS — N186 End stage renal disease: Secondary | ICD-10-CM | POA: Diagnosis not present

## 2018-11-15 DIAGNOSIS — R197 Diarrhea, unspecified: Secondary | ICD-10-CM | POA: Diagnosis not present

## 2018-11-15 DIAGNOSIS — Z992 Dependence on renal dialysis: Secondary | ICD-10-CM | POA: Diagnosis not present

## 2018-11-15 DIAGNOSIS — E1122 Type 2 diabetes mellitus with diabetic chronic kidney disease: Secondary | ICD-10-CM | POA: Diagnosis not present

## 2018-11-15 MED ORDER — PROCHLORPERAZINE EDISYLATE 10 MG/2ML IJ SOLN
10.0000 mg | Freq: Once | INTRAMUSCULAR | Status: AC
  Administered 2018-11-15: 10 mg via INTRAMUSCULAR
  Filled 2018-11-15: qty 2

## 2018-11-15 MED ORDER — OXYCODONE HCL 5 MG PO TABS
10.0000 mg | ORAL_TABLET | Freq: Once | ORAL | Status: AC
  Administered 2018-11-15: 04:00:00 10 mg via ORAL
  Filled 2018-11-15: qty 2

## 2018-11-15 NOTE — ED Triage Notes (Signed)
Patient presents to ED via [facility] states he has n/v/d, was seen yest for same. Restrictions to both arms

## 2018-11-15 NOTE — ED Notes (Signed)
Patient is sleeping
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
## 2018-11-17 DIAGNOSIS — N186 End stage renal disease: Secondary | ICD-10-CM | POA: Diagnosis not present

## 2018-11-17 DIAGNOSIS — N2581 Secondary hyperparathyroidism of renal origin: Secondary | ICD-10-CM | POA: Diagnosis not present

## 2018-11-17 DIAGNOSIS — D509 Iron deficiency anemia, unspecified: Secondary | ICD-10-CM | POA: Diagnosis not present

## 2018-11-17 DIAGNOSIS — Z992 Dependence on renal dialysis: Secondary | ICD-10-CM | POA: Diagnosis not present

## 2018-11-17 DIAGNOSIS — D631 Anemia in chronic kidney disease: Secondary | ICD-10-CM | POA: Diagnosis not present

## 2018-11-17 DIAGNOSIS — E875 Hyperkalemia: Secondary | ICD-10-CM | POA: Diagnosis not present

## 2018-11-18 DIAGNOSIS — D509 Iron deficiency anemia, unspecified: Secondary | ICD-10-CM | POA: Diagnosis not present

## 2018-11-18 DIAGNOSIS — G8929 Other chronic pain: Secondary | ICD-10-CM | POA: Diagnosis not present

## 2018-11-18 DIAGNOSIS — Z79899 Other long term (current) drug therapy: Secondary | ICD-10-CM | POA: Diagnosis not present

## 2018-11-18 DIAGNOSIS — S41101D Unspecified open wound of right upper arm, subsequent encounter: Secondary | ICD-10-CM | POA: Diagnosis not present

## 2018-11-18 DIAGNOSIS — N186 End stage renal disease: Secondary | ICD-10-CM | POA: Diagnosis not present

## 2018-11-18 DIAGNOSIS — M545 Low back pain: Secondary | ICD-10-CM | POA: Diagnosis not present

## 2018-11-18 DIAGNOSIS — D631 Anemia in chronic kidney disease: Secondary | ICD-10-CM | POA: Diagnosis not present

## 2018-11-18 DIAGNOSIS — Z992 Dependence on renal dialysis: Secondary | ICD-10-CM | POA: Diagnosis not present

## 2018-11-18 DIAGNOSIS — N2581 Secondary hyperparathyroidism of renal origin: Secondary | ICD-10-CM | POA: Diagnosis not present

## 2018-11-18 DIAGNOSIS — E875 Hyperkalemia: Secondary | ICD-10-CM | POA: Diagnosis not present

## 2018-11-19 DIAGNOSIS — S41101S Unspecified open wound of right upper arm, sequela: Secondary | ICD-10-CM | POA: Diagnosis not present

## 2018-11-19 DIAGNOSIS — M79601 Pain in right arm: Secondary | ICD-10-CM | POA: Diagnosis not present

## 2018-11-20 ENCOUNTER — Emergency Department (HOSPITAL_COMMUNITY)
Admission: EM | Admit: 2018-11-20 | Discharge: 2018-11-20 | Disposition: A | Discharge status: Home / Self Care | Payer: Medicare Other | Attending: Emergency Medicine | Admitting: Emergency Medicine

## 2018-11-20 ENCOUNTER — Other Ambulatory Visit: Payer: Self-pay

## 2018-11-20 ENCOUNTER — Encounter (HOSPITAL_COMMUNITY): Payer: Self-pay

## 2018-11-20 DIAGNOSIS — N186 End stage renal disease: Secondary | ICD-10-CM | POA: Diagnosis not present

## 2018-11-20 DIAGNOSIS — N185 Chronic kidney disease, stage 5: Secondary | ICD-10-CM | POA: Insufficient documentation

## 2018-11-20 DIAGNOSIS — R52 Pain, unspecified: Secondary | ICD-10-CM | POA: Diagnosis not present

## 2018-11-20 DIAGNOSIS — Z86718 Personal history of other venous thrombosis and embolism: Secondary | ICD-10-CM | POA: Diagnosis not present

## 2018-11-20 DIAGNOSIS — Z87891 Personal history of nicotine dependence: Secondary | ICD-10-CM | POA: Insufficient documentation

## 2018-11-20 DIAGNOSIS — G8929 Other chronic pain: Secondary | ICD-10-CM | POA: Diagnosis not present

## 2018-11-20 DIAGNOSIS — I132 Hypertensive heart and chronic kidney disease with heart failure and with stage 5 chronic kidney disease, or end stage renal disease: Secondary | ICD-10-CM | POA: Insufficient documentation

## 2018-11-20 DIAGNOSIS — Z7901 Long term (current) use of anticoagulants: Secondary | ICD-10-CM | POA: Diagnosis not present

## 2018-11-20 DIAGNOSIS — T8610 Unspecified complication of kidney transplant: Secondary | ICD-10-CM | POA: Diagnosis not present

## 2018-11-20 DIAGNOSIS — Z86711 Personal history of pulmonary embolism: Secondary | ICD-10-CM | POA: Diagnosis not present

## 2018-11-20 DIAGNOSIS — E1122 Type 2 diabetes mellitus with diabetic chronic kidney disease: Secondary | ICD-10-CM | POA: Diagnosis not present

## 2018-11-20 DIAGNOSIS — I5042 Chronic combined systolic (congestive) and diastolic (congestive) heart failure: Secondary | ICD-10-CM | POA: Diagnosis not present

## 2018-11-20 DIAGNOSIS — R109 Unspecified abdominal pain: Secondary | ICD-10-CM | POA: Insufficient documentation

## 2018-11-20 DIAGNOSIS — Z992 Dependence on renal dialysis: Secondary | ICD-10-CM | POA: Diagnosis not present

## 2018-11-20 DIAGNOSIS — R110 Nausea: Secondary | ICD-10-CM | POA: Insufficient documentation

## 2018-11-20 DIAGNOSIS — Z79899 Other long term (current) drug therapy: Secondary | ICD-10-CM | POA: Insufficient documentation

## 2018-11-20 DIAGNOSIS — R112 Nausea with vomiting, unspecified: Secondary | ICD-10-CM | POA: Diagnosis not present

## 2018-11-20 MED ORDER — OXYCODONE HCL 5 MG PO TABS
10.0000 mg | ORAL_TABLET | Freq: Once | ORAL | Status: AC
  Administered 2018-11-20: 04:00:00 10 mg via ORAL
  Filled 2018-11-20: qty 2

## 2018-11-20 MED ORDER — PROMETHAZINE HCL 25 MG RE SUPP: 25.0000 mg | Freq: Three times a day (TID) | RECTAL | 0 refills | Status: DC | PRN

## 2018-11-20 MED ORDER — PROCHLORPERAZINE EDISYLATE 10 MG/2ML IJ SOLN
10.0000 mg | Freq: Once | INTRAMUSCULAR | Status: AC
  Administered 2018-11-20: 10 mg via INTRAMUSCULAR
  Filled 2018-11-20: qty 2

## 2018-11-20 NOTE — ED Triage Notes (Signed)
Per [facility], pt from home w/ a c/o N/V/D and abd pain for two days. The pain is located on the right side of his abd and is sharp and burning. The pain is constant. Both arms are restricted. Dialysis MWF.

## 2018-11-20 NOTE — ED Notes (Signed)
Patient lying down upon this RN entering the room, resp e/u,nad. Patient sat up to take pain meds then laid back down on stretcher and stated "I need some oxygen, I'm sob when I lay down." this RN checked patient's O2 sat which was 100% on room air. Patient continues to have spontaneous respirations that are equal and unlabored. This RN advised patient that his O2 sats are WNL and supplemental O2 is not necessary at this time. Patient then stated, I can't breathe if I lay down, this RN offered to sit patient's HOB up but patient refused.

## 2018-11-20 NOTE — ED Notes (Signed)
BP stable. Patient taken out via wheelchair, resp e/u, nad.
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
## 2018-11-20 NOTE — Discharge Instructions (Signed)
Abdominal (belly) pain can be caused by many things. Your caregiver performed an examination and possibly ordered blood/urine tests and imaging (CT scan, x-rays, ultrasound). Many cases can be observed and treated at home after initial evaluation in the emergency department. Even though you are being discharged home, abdominal pain can be unpredictable. Therefore, you need a repeated exam if your pain does not resolve, returns, or worsens. Most patients with abdominal pain don't have to be admitted to the hospital or have surgery, but serious problems like appendicitis and gallbladder attacks can start out as nonspecific pain. Many abdominal conditions cannot be diagnosed in one visit, so follow-up evaluations are very important. SEEK IMMEDIATE MEDICAL ATTENTION IF: The pain does not go away or becomes severe.  A temperature above 101 develops.  Repeated vomiting occurs (multiple episodes).  The pain becomes localized to portions of the abdomen. The right side could possibly be appendicitis. In an adult, the left lower portion of the abdomen could be colitis or diverticulitis.  Blood is being passed in stools or vomit (bright red or black tarry stools).  Return also if you develop chest pain, difficulty breathing, dizziness or fainting, or become confused, poorly responsive, or inconsolable (young children).

## 2018-11-20 NOTE — ED Notes (Signed)
Pt wishes to sign out ama

## 2018-11-20 NOTE — ED Notes (Signed)
Patient refused all vital signs other than BP to be taken prior to discharge.

## 2018-11-21 DIAGNOSIS — Z992 Dependence on renal dialysis: Secondary | ICD-10-CM | POA: Diagnosis not present

## 2018-11-21 DIAGNOSIS — N2581 Secondary hyperparathyroidism of renal origin: Secondary | ICD-10-CM | POA: Diagnosis not present

## 2018-11-21 DIAGNOSIS — N186 End stage renal disease: Secondary | ICD-10-CM | POA: Diagnosis not present

## 2018-11-21 DIAGNOSIS — D631 Anemia in chronic kidney disease: Secondary | ICD-10-CM | POA: Diagnosis not present

## 2018-11-21 DIAGNOSIS — D509 Iron deficiency anemia, unspecified: Secondary | ICD-10-CM | POA: Diagnosis not present

## 2018-11-21 DIAGNOSIS — E1122 Type 2 diabetes mellitus with diabetic chronic kidney disease: Secondary | ICD-10-CM | POA: Diagnosis not present

## 2018-11-21 DIAGNOSIS — E875 Hyperkalemia: Secondary | ICD-10-CM | POA: Diagnosis not present

## 2018-11-21 DIAGNOSIS — Z79899 Other long term (current) drug therapy: Secondary | ICD-10-CM | POA: Diagnosis not present

## 2018-11-22 DIAGNOSIS — S41101A Unspecified open wound of right upper arm, initial encounter: Secondary | ICD-10-CM | POA: Diagnosis not present

## 2018-11-22 DIAGNOSIS — E1122 Type 2 diabetes mellitus with diabetic chronic kidney disease: Secondary | ICD-10-CM | POA: Diagnosis not present

## 2018-11-22 DIAGNOSIS — R109 Unspecified abdominal pain: Secondary | ICD-10-CM | POA: Diagnosis not present

## 2018-11-22 DIAGNOSIS — I132 Hypertensive heart and chronic kidney disease with heart failure and with stage 5 chronic kidney disease, or end stage renal disease: Secondary | ICD-10-CM | POA: Diagnosis not present

## 2018-11-22 DIAGNOSIS — R140 Abdominal distension (gaseous): Secondary | ICD-10-CM | POA: Diagnosis not present

## 2018-11-22 DIAGNOSIS — I517 Cardiomegaly: Secondary | ICD-10-CM | POA: Diagnosis not present

## 2018-11-22 DIAGNOSIS — R112 Nausea with vomiting, unspecified: Secondary | ICD-10-CM | POA: Diagnosis not present

## 2018-11-22 DIAGNOSIS — Z86718 Personal history of other venous thrombosis and embolism: Secondary | ICD-10-CM | POA: Diagnosis not present

## 2018-11-22 DIAGNOSIS — R197 Diarrhea, unspecified: Secondary | ICD-10-CM | POA: Diagnosis not present

## 2018-11-22 DIAGNOSIS — J90 Pleural effusion, not elsewhere classified: Secondary | ICD-10-CM | POA: Diagnosis not present

## 2018-11-22 DIAGNOSIS — G4733 Obstructive sleep apnea (adult) (pediatric): Secondary | ICD-10-CM | POA: Diagnosis not present

## 2018-11-22 DIAGNOSIS — I509 Heart failure, unspecified: Secondary | ICD-10-CM | POA: Diagnosis not present

## 2018-11-22 DIAGNOSIS — R0989 Other specified symptoms and signs involving the circulatory and respiratory systems: Secondary | ICD-10-CM | POA: Diagnosis not present

## 2018-11-22 DIAGNOSIS — N186 End stage renal disease: Secondary | ICD-10-CM | POA: Diagnosis not present

## 2018-11-22 DIAGNOSIS — G8929 Other chronic pain: Secondary | ICD-10-CM | POA: Diagnosis not present

## 2018-11-22 DIAGNOSIS — Z992 Dependence on renal dialysis: Secondary | ICD-10-CM | POA: Diagnosis not present

## 2018-11-22 DIAGNOSIS — Z888 Allergy status to other drugs, medicaments and biological substances status: Secondary | ICD-10-CM | POA: Diagnosis not present

## 2018-11-22 DIAGNOSIS — Z7901 Long term (current) use of anticoagulants: Secondary | ICD-10-CM | POA: Diagnosis not present

## 2018-11-22 DIAGNOSIS — X58XXXA Exposure to other specified factors, initial encounter: Secondary | ICD-10-CM | POA: Diagnosis not present

## 2018-11-22 DIAGNOSIS — Z79899 Other long term (current) drug therapy: Secondary | ICD-10-CM | POA: Diagnosis not present

## 2018-11-22 DIAGNOSIS — Z940 Kidney transplant status: Secondary | ICD-10-CM | POA: Diagnosis not present

## 2018-11-22 DIAGNOSIS — Z886 Allergy status to analgesic agent status: Secondary | ICD-10-CM | POA: Diagnosis not present

## 2018-11-22 DIAGNOSIS — Z9981 Dependence on supplemental oxygen: Secondary | ICD-10-CM | POA: Diagnosis not present

## 2018-11-22 DIAGNOSIS — Z9115 Patient's noncompliance with renal dialysis: Secondary | ICD-10-CM | POA: Diagnosis not present

## 2018-11-22 DIAGNOSIS — I5042 Chronic combined systolic (congestive) and diastolic (congestive) heart failure: Secondary | ICD-10-CM | POA: Diagnosis not present

## 2018-11-22 DIAGNOSIS — Z794 Long term (current) use of insulin: Secondary | ICD-10-CM | POA: Diagnosis not present

## 2018-11-23 ENCOUNTER — Encounter (HOSPITAL_COMMUNITY): Payer: Self-pay | Admitting: Emergency Medicine

## 2018-11-23 ENCOUNTER — Other Ambulatory Visit: Payer: Self-pay

## 2018-11-23 ENCOUNTER — Emergency Department (HOSPITAL_COMMUNITY)
Admission: EM | Admit: 2018-11-23 | Discharge: 2018-11-23 | Disposition: A | Discharge status: Home / Self Care | Payer: Medicare Other | Source: Home / Self Care | Attending: Emergency Medicine | Admitting: Emergency Medicine

## 2018-11-23 ENCOUNTER — Emergency Department (HOSPITAL_COMMUNITY)
Admission: EM | Admit: 2018-11-23 | Discharge: 2018-11-23 | Disposition: A | Discharge status: Home / Self Care | Payer: Medicare Other | Attending: Emergency Medicine | Admitting: Emergency Medicine

## 2018-11-23 DIAGNOSIS — E1122 Type 2 diabetes mellitus with diabetic chronic kidney disease: Secondary | ICD-10-CM | POA: Diagnosis not present

## 2018-11-23 DIAGNOSIS — Z79899 Other long term (current) drug therapy: Secondary | ICD-10-CM | POA: Insufficient documentation

## 2018-11-23 DIAGNOSIS — Z992 Dependence on renal dialysis: Secondary | ICD-10-CM | POA: Insufficient documentation

## 2018-11-23 DIAGNOSIS — R1011 Right upper quadrant pain: Secondary | ICD-10-CM | POA: Diagnosis not present

## 2018-11-23 DIAGNOSIS — I132 Hypertensive heart and chronic kidney disease with heart failure and with stage 5 chronic kidney disease, or end stage renal disease: Secondary | ICD-10-CM

## 2018-11-23 DIAGNOSIS — Z87891 Personal history of nicotine dependence: Secondary | ICD-10-CM

## 2018-11-23 DIAGNOSIS — F1210 Cannabis abuse, uncomplicated: Secondary | ICD-10-CM | POA: Insufficient documentation

## 2018-11-23 DIAGNOSIS — I5042 Chronic combined systolic (congestive) and diastolic (congestive) heart failure: Secondary | ICD-10-CM | POA: Insufficient documentation

## 2018-11-23 DIAGNOSIS — G8929 Other chronic pain: Secondary | ICD-10-CM | POA: Insufficient documentation

## 2018-11-23 DIAGNOSIS — Z7901 Long term (current) use of anticoagulants: Secondary | ICD-10-CM | POA: Insufficient documentation

## 2018-11-23 DIAGNOSIS — I5022 Chronic systolic (congestive) heart failure: Secondary | ICD-10-CM | POA: Diagnosis not present

## 2018-11-23 DIAGNOSIS — R112 Nausea with vomiting, unspecified: Secondary | ICD-10-CM | POA: Insufficient documentation

## 2018-11-23 DIAGNOSIS — R197 Diarrhea, unspecified: Secondary | ICD-10-CM | POA: Insufficient documentation

## 2018-11-23 DIAGNOSIS — N186 End stage renal disease: Secondary | ICD-10-CM | POA: Insufficient documentation

## 2018-11-23 DIAGNOSIS — D509 Iron deficiency anemia, unspecified: Secondary | ICD-10-CM | POA: Diagnosis not present

## 2018-11-23 DIAGNOSIS — R109 Unspecified abdominal pain: Secondary | ICD-10-CM

## 2018-11-23 DIAGNOSIS — I120 Hypertensive chronic kidney disease with stage 5 chronic kidney disease or end stage renal disease: Secondary | ICD-10-CM | POA: Diagnosis not present

## 2018-11-23 DIAGNOSIS — I2510 Atherosclerotic heart disease of native coronary artery without angina pectoris: Secondary | ICD-10-CM | POA: Insufficient documentation

## 2018-11-23 DIAGNOSIS — N2581 Secondary hyperparathyroidism of renal origin: Secondary | ICD-10-CM | POA: Diagnosis not present

## 2018-11-23 DIAGNOSIS — D631 Anemia in chronic kidney disease: Secondary | ICD-10-CM | POA: Diagnosis not present

## 2018-11-23 LAB — COMPREHENSIVE METABOLIC PANEL
ALT: 11 U/L (ref 0–44)
AST: 30 U/L (ref 15–41)
Albumin: 2.8 g/dL — ABNORMAL LOW (ref 3.5–5.0)
Alkaline Phosphatase: 116 U/L (ref 38–126)
Anion gap: 16 — ABNORMAL HIGH (ref 5–15)
BUN: 31 mg/dL — ABNORMAL HIGH (ref 6–20)
CO2: 27 mmol/L (ref 22–32)
Calcium: 9.4 mg/dL (ref 8.9–10.3)
Chloride: 96 mmol/L — ABNORMAL LOW (ref 98–111)
Creatinine, Ser: 8.34 mg/dL — ABNORMAL HIGH (ref 0.61–1.24)
GFR calc Af Amer: 8 mL/min — ABNORMAL LOW (ref >60)
GFR calc non Af Amer: 7 mL/min — ABNORMAL LOW (ref >60)
Glucose, Bld: 67 mg/dL — ABNORMAL LOW (ref 70–99)
Potassium: 4 mmol/L (ref 3.5–5.1)
Sodium: 139 mmol/L (ref 135–145)
Total Bilirubin: 1.4 mg/dL — ABNORMAL HIGH (ref 0.3–1.2)
Total Protein: 9.2 g/dL — ABNORMAL HIGH (ref 6.5–8.1)

## 2018-11-23 LAB — CBC WITH DIFFERENTIAL/PLATELET
Abs Immature Granulocytes: 0.02 10*3/uL (ref 0.00–0.07)
Basophils Absolute: 0 10*3/uL (ref 0.0–0.1)
Basophils Relative: 1 %
Eosinophils Absolute: 0.2 10*3/uL (ref 0.0–0.5)
Eosinophils Relative: 5 %
HCT: 27.2 % — ABNORMAL LOW (ref 39.0–52.0)
Hemoglobin: 8.6 g/dL — ABNORMAL LOW (ref 13.0–17.0)
Immature Granulocytes: 1 %
Lymphocytes Relative: 15 %
Lymphs Abs: 0.6 10*3/uL — ABNORMAL LOW (ref 0.7–4.0)
MCH: 27.5 pg (ref 26.0–34.0)
MCHC: 31.6 g/dL (ref 30.0–36.0)
MCV: 86.9 fL (ref 80.0–100.0)
Monocytes Absolute: 0.6 10*3/uL (ref 0.1–1.0)
Monocytes Relative: 15 %
Neutro Abs: 2.6 10*3/uL (ref 1.7–7.7)
Neutrophils Relative %: 63 %
Platelets: 156 10*3/uL (ref 150–400)
RBC: 3.13 MIL/uL — ABNORMAL LOW (ref 4.22–5.81)
RDW: 20 % — ABNORMAL HIGH (ref 11.5–15.5)
WBC: 4.1 10*3/uL (ref 4.0–10.5)
nRBC: 0 % (ref 0.0–0.2)

## 2018-11-23 LAB — CBG MONITORING, ED
Glucose-Capillary: 66 mg/dL — ABNORMAL LOW (ref 70–99)
Glucose-Capillary: 73 mg/dL (ref 70–99)
Glucose-Capillary: 88 mg/dL (ref 70–99)
Glucose-Capillary: 72 mg/dL (ref 70–99)

## 2018-11-23 LAB — LIPASE, BLOOD: Lipase: 24 U/L (ref 11–51)

## 2018-11-23 MED ORDER — AMLODIPINE BESYLATE 5 MG PO TABS: 5.0000 mg | ORAL_TABLET | Freq: Once | ORAL | Status: DC

## 2018-11-23 MED ORDER — SODIUM CHLORIDE 0.9 % IV BOLUS (SEPSIS): 1000.0000 mL | Freq: Once | INTRAVENOUS | Status: DC

## 2018-11-23 MED ORDER — ALUM & MAG HYDROXIDE-SIMETH 200-200-20 MG/5ML PO SUSP
30.0000 mL | Freq: Once | ORAL | Status: AC
  Administered 2018-11-23: 10:00:00 30 mL via ORAL
  Filled 2018-11-23: qty 30

## 2018-11-23 MED ORDER — HYDRALAZINE HCL 20 MG/ML IJ SOLN
10.0000 mg | Freq: Once | INTRAMUSCULAR | Status: AC
  Administered 2018-11-23: 10 mg via INTRAVENOUS
  Filled 2018-11-23: qty 1

## 2018-11-23 MED ORDER — ONDANSETRON HCL 4 MG/2ML IJ SOLN
4.0000 mg | Freq: Once | INTRAMUSCULAR | Status: AC
  Administered 2018-11-23: 4 mg via INTRAVENOUS
  Filled 2018-11-23: qty 2

## 2018-11-23 MED ORDER — PROCHLORPERAZINE EDISYLATE 10 MG/2ML IJ SOLN
10.0000 mg | Freq: Once | INTRAMUSCULAR | Status: AC
  Administered 2018-11-23: 10 mg via INTRAMUSCULAR
  Filled 2018-11-23: qty 2

## 2018-11-23 MED ORDER — ONDANSETRON HCL 4 MG/2ML IJ SOLN: 4.0000 mg | Freq: Once | INTRAMUSCULAR | Status: DC | PRN

## 2018-11-23 MED ORDER — OXYCODONE HCL 5 MG PO TABS
10.0000 mg | ORAL_TABLET | Freq: Once | ORAL | Status: DC
  Filled 2018-11-23: qty 2

## 2018-11-23 MED ORDER — HYDRALAZINE HCL 25 MG PO TABS: 100.0000 mg | ORAL_TABLET | Freq: Once | ORAL | Status: DC

## 2018-11-23 MED ORDER — PROCHLORPERAZINE MALEATE 10 MG PO TABS: 10.0000 mg | ORAL_TABLET | Freq: Four times a day (QID) | ORAL | 0 refills | Status: DC | PRN

## 2018-11-23 MED ORDER — PROCHLORPERAZINE 25 MG RE SUPP: 25.0000 mg | Freq: Two times a day (BID) | RECTAL | 0 refills | Status: DC | PRN

## 2018-11-23 MED ORDER — ONDANSETRON 4 MG PO TBDP: 4.0000 mg | ORAL_TABLET | Freq: Once | ORAL | Status: DC

## 2018-11-23 MED ORDER — PANTOPRAZOLE SODIUM 40 MG IV SOLR
40.0000 mg | Freq: Once | INTRAVENOUS | Status: AC
  Administered 2018-11-23: 09:00:00 40 mg via INTRAVENOUS
  Filled 2018-11-23: qty 40

## 2018-11-23 MED ORDER — ONDANSETRON HCL 4 MG/2ML IJ SOLN: 4.0000 mg | Freq: Once | INTRAMUSCULAR | Status: DC

## 2018-11-23 MED ORDER — LIDOCAINE VISCOUS HCL 2 % MT SOLN
15.0000 mL | Freq: Once | OROMUCOSAL | Status: AC
  Administered 2018-11-23: 15 mL via ORAL
  Filled 2018-11-23: qty 15

## 2018-11-23 NOTE — ED Triage Notes (Signed)
Pt c/o generalized abd pain with vomiting and diarrhea x's 3 days.  Pt is dialysis pt.  Last dialysis was Wed.

## 2018-11-23 NOTE — ED Notes (Signed)
PT yelled out stating that his arm was bleeding.  Upon entering room, pt sitting up in chair beside bed, L arm spurting blood from graft.  Direct pressure applied.  MD at bedside and combat gauze applied with pressure dressing.

## 2018-11-23 NOTE — ED Triage Notes (Signed)
Pt seen last night and discharged, returns now for c/o vomiting blood since he got home.

## 2018-11-23 NOTE — ED Notes (Signed)
Requested pt's dialysis center info to call them RE bleeding fistula, but pt refused.
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
## 2018-11-23 NOTE — Discharge Instructions (Signed)
Make sure to get to your dialysis session today.

## 2018-11-23 NOTE — ED Notes (Signed)
Pt refusing to stand with assistance.  Grabbed bedside from tech when she stated she was going to remove bedside b/c he was not allowing assistance.

## 2018-11-23 NOTE — ED Notes (Signed)
Pt being noncompliant, not taking RN or Tech advice.

## 2018-11-23 NOTE — Discharge Instructions (Addendum)
You were seen in the emergency department for nausea, vomiting, diarrhea, and abdominal pain. Labs appeared similar to prior.  Your blood sugar was a bit low upon arrival, this improved with drinking soda.  Please be sure to eat and drink throughout the day today monitor your blood sugar closely.  Please go to dialysis.  Please keep the dressing in place until you have arrived.   Take medications prescribed at prior ER visit.  Return to the ER for new or worsening symptoms or any other concerns.
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
## 2018-11-23 NOTE — ED Notes (Signed)
Pt removed pressure dressing from L arm stating that it hurt.  Wound seeping, but controlled.  New combat gauze placed per Dr Sherry Ruffing and wound re-dressed.

## 2018-11-25 DIAGNOSIS — Z992 Dependence on renal dialysis: Secondary | ICD-10-CM | POA: Diagnosis not present

## 2018-11-25 DIAGNOSIS — D509 Iron deficiency anemia, unspecified: Secondary | ICD-10-CM | POA: Diagnosis not present

## 2018-11-25 DIAGNOSIS — N2581 Secondary hyperparathyroidism of renal origin: Secondary | ICD-10-CM | POA: Diagnosis not present

## 2018-11-25 DIAGNOSIS — N186 End stage renal disease: Secondary | ICD-10-CM | POA: Diagnosis not present

## 2018-11-25 DIAGNOSIS — E1122 Type 2 diabetes mellitus with diabetic chronic kidney disease: Secondary | ICD-10-CM | POA: Diagnosis not present

## 2018-11-25 DIAGNOSIS — D631 Anemia in chronic kidney disease: Secondary | ICD-10-CM | POA: Diagnosis not present

## 2018-11-26 DIAGNOSIS — R112 Nausea with vomiting, unspecified: Secondary | ICD-10-CM | POA: Diagnosis not present

## 2018-11-26 DIAGNOSIS — S41101D Unspecified open wound of right upper arm, subsequent encounter: Secondary | ICD-10-CM | POA: Diagnosis not present

## 2018-11-26 DIAGNOSIS — N186 End stage renal disease: Secondary | ICD-10-CM | POA: Diagnosis not present

## 2018-11-26 DIAGNOSIS — Z79899 Other long term (current) drug therapy: Secondary | ICD-10-CM | POA: Diagnosis not present

## 2018-11-26 DIAGNOSIS — I5042 Chronic combined systolic (congestive) and diastolic (congestive) heart failure: Secondary | ICD-10-CM | POA: Diagnosis not present

## 2018-11-26 DIAGNOSIS — Z794 Long term (current) use of insulin: Secondary | ICD-10-CM | POA: Diagnosis not present

## 2018-11-26 DIAGNOSIS — E1122 Type 2 diabetes mellitus with diabetic chronic kidney disease: Secondary | ICD-10-CM | POA: Diagnosis not present

## 2018-11-26 DIAGNOSIS — Z992 Dependence on renal dialysis: Secondary | ICD-10-CM | POA: Diagnosis not present

## 2018-11-26 DIAGNOSIS — S41101A Unspecified open wound of right upper arm, initial encounter: Secondary | ICD-10-CM | POA: Diagnosis not present

## 2018-11-26 DIAGNOSIS — Z888 Allergy status to other drugs, medicaments and biological substances status: Secondary | ICD-10-CM | POA: Diagnosis not present

## 2018-11-26 DIAGNOSIS — J90 Pleural effusion, not elsewhere classified: Secondary | ICD-10-CM | POA: Diagnosis not present

## 2018-11-26 DIAGNOSIS — I120 Hypertensive chronic kidney disease with stage 5 chronic kidney disease or end stage renal disease: Secondary | ICD-10-CM | POA: Diagnosis not present

## 2018-11-26 DIAGNOSIS — R1110 Vomiting, unspecified: Secondary | ICD-10-CM | POA: Diagnosis not present

## 2018-11-26 DIAGNOSIS — X58XXXD Exposure to other specified factors, subsequent encounter: Secondary | ICD-10-CM | POA: Diagnosis not present

## 2018-11-26 DIAGNOSIS — R1084 Generalized abdominal pain: Secondary | ICD-10-CM | POA: Diagnosis not present

## 2018-11-26 DIAGNOSIS — I132 Hypertensive heart and chronic kidney disease with heart failure and with stage 5 chronic kidney disease, or end stage renal disease: Secondary | ICD-10-CM | POA: Diagnosis not present

## 2018-11-27 DIAGNOSIS — S41101D Unspecified open wound of right upper arm, subsequent encounter: Secondary | ICD-10-CM | POA: Diagnosis not present

## 2018-11-27 DIAGNOSIS — Z886 Allergy status to analgesic agent status: Secondary | ICD-10-CM | POA: Diagnosis not present

## 2018-11-27 DIAGNOSIS — Z888 Allergy status to other drugs, medicaments and biological substances status: Secondary | ICD-10-CM | POA: Diagnosis not present

## 2018-11-27 DIAGNOSIS — G4733 Obstructive sleep apnea (adult) (pediatric): Secondary | ICD-10-CM | POA: Diagnosis not present

## 2018-11-27 DIAGNOSIS — I132 Hypertensive heart and chronic kidney disease with heart failure and with stage 5 chronic kidney disease, or end stage renal disease: Secondary | ICD-10-CM | POA: Diagnosis not present

## 2018-11-27 DIAGNOSIS — Z86711 Personal history of pulmonary embolism: Secondary | ICD-10-CM | POA: Diagnosis not present

## 2018-11-27 DIAGNOSIS — N186 End stage renal disease: Secondary | ICD-10-CM | POA: Diagnosis not present

## 2018-11-27 DIAGNOSIS — Z87891 Personal history of nicotine dependence: Secondary | ICD-10-CM | POA: Diagnosis not present

## 2018-11-27 DIAGNOSIS — D61818 Other pancytopenia: Secondary | ICD-10-CM | POA: Diagnosis not present

## 2018-11-27 DIAGNOSIS — K56600 Partial intestinal obstruction, unspecified as to cause: Secondary | ICD-10-CM | POA: Diagnosis not present

## 2018-11-27 DIAGNOSIS — E1122 Type 2 diabetes mellitus with diabetic chronic kidney disease: Secondary | ICD-10-CM | POA: Diagnosis not present

## 2018-11-27 DIAGNOSIS — Z9109 Other allergy status, other than to drugs and biological substances: Secondary | ICD-10-CM | POA: Diagnosis not present

## 2018-11-27 DIAGNOSIS — K219 Gastro-esophageal reflux disease without esophagitis: Secondary | ICD-10-CM | POA: Diagnosis not present

## 2018-11-27 DIAGNOSIS — R109 Unspecified abdominal pain: Secondary | ICD-10-CM | POA: Diagnosis not present

## 2018-11-27 DIAGNOSIS — R110 Nausea: Secondary | ICD-10-CM | POA: Diagnosis not present

## 2018-11-27 DIAGNOSIS — I5042 Chronic combined systolic (congestive) and diastolic (congestive) heart failure: Secondary | ICD-10-CM | POA: Diagnosis not present

## 2018-11-27 DIAGNOSIS — Z833 Family history of diabetes mellitus: Secondary | ICD-10-CM | POA: Diagnosis not present

## 2018-11-27 DIAGNOSIS — E785 Hyperlipidemia, unspecified: Secondary | ICD-10-CM | POA: Diagnosis not present

## 2018-11-27 DIAGNOSIS — I5043 Acute on chronic combined systolic (congestive) and diastolic (congestive) heart failure: Secondary | ICD-10-CM | POA: Diagnosis not present

## 2018-11-27 DIAGNOSIS — Z8249 Family history of ischemic heart disease and other diseases of the circulatory system: Secondary | ICD-10-CM | POA: Diagnosis not present

## 2018-11-27 DIAGNOSIS — R112 Nausea with vomiting, unspecified: Secondary | ICD-10-CM | POA: Diagnosis not present

## 2018-11-27 DIAGNOSIS — R1084 Generalized abdominal pain: Secondary | ICD-10-CM | POA: Diagnosis not present

## 2018-11-27 DIAGNOSIS — Z79899 Other long term (current) drug therapy: Secondary | ICD-10-CM | POA: Diagnosis not present

## 2018-11-27 DIAGNOSIS — Z992 Dependence on renal dialysis: Secondary | ICD-10-CM | POA: Diagnosis not present

## 2018-11-27 DIAGNOSIS — I120 Hypertensive chronic kidney disease with stage 5 chronic kidney disease or end stage renal disease: Secondary | ICD-10-CM | POA: Diagnosis not present

## 2018-11-27 DIAGNOSIS — K761 Chronic passive congestion of liver: Secondary | ICD-10-CM | POA: Diagnosis not present

## 2018-11-27 DIAGNOSIS — F419 Anxiety disorder, unspecified: Secondary | ICD-10-CM | POA: Diagnosis not present

## 2018-11-27 DIAGNOSIS — Z79891 Long term (current) use of opiate analgesic: Secondary | ICD-10-CM | POA: Diagnosis not present

## 2018-11-27 DIAGNOSIS — R1031 Right lower quadrant pain: Secondary | ICD-10-CM | POA: Diagnosis not present

## 2018-11-27 DIAGNOSIS — I509 Heart failure, unspecified: Secondary | ICD-10-CM | POA: Diagnosis not present

## 2018-11-27 DIAGNOSIS — Z794 Long term (current) use of insulin: Secondary | ICD-10-CM | POA: Diagnosis not present

## 2018-11-27 DIAGNOSIS — S51801A Unspecified open wound of right forearm, initial encounter: Secondary | ICD-10-CM | POA: Diagnosis not present

## 2018-11-27 DIAGNOSIS — Z7901 Long term (current) use of anticoagulants: Secondary | ICD-10-CM | POA: Diagnosis not present

## 2018-11-27 DIAGNOSIS — Z940 Kidney transplant status: Secondary | ICD-10-CM | POA: Diagnosis not present

## 2018-11-27 DIAGNOSIS — G8929 Other chronic pain: Secondary | ICD-10-CM | POA: Diagnosis not present

## 2018-11-27 DIAGNOSIS — Z20828 Contact with and (suspected) exposure to other viral communicable diseases: Secondary | ICD-10-CM | POA: Diagnosis not present

## 2018-11-27 DIAGNOSIS — K861 Other chronic pancreatitis: Secondary | ICD-10-CM | POA: Diagnosis not present

## 2018-11-27 DIAGNOSIS — Z5329 Procedure and treatment not carried out because of patient's decision for other reasons: Secondary | ICD-10-CM | POA: Diagnosis not present

## 2018-11-27 DIAGNOSIS — X58XXXD Exposure to other specified factors, subsequent encounter: Secondary | ICD-10-CM | POA: Diagnosis not present

## 2018-11-27 DIAGNOSIS — Z86718 Personal history of other venous thrombosis and embolism: Secondary | ICD-10-CM | POA: Diagnosis not present

## 2018-11-27 DIAGNOSIS — R197 Diarrhea, unspecified: Secondary | ICD-10-CM | POA: Diagnosis not present

## 2018-11-27 DIAGNOSIS — Z9981 Dependence on supplemental oxygen: Secondary | ICD-10-CM | POA: Diagnosis not present

## 2018-11-27 DIAGNOSIS — Z905 Acquired absence of kidney: Secondary | ICD-10-CM | POA: Diagnosis not present

## 2018-11-28 DIAGNOSIS — N2581 Secondary hyperparathyroidism of renal origin: Secondary | ICD-10-CM | POA: Diagnosis not present

## 2018-11-28 DIAGNOSIS — N186 End stage renal disease: Secondary | ICD-10-CM | POA: Diagnosis not present

## 2018-11-28 DIAGNOSIS — Z992 Dependence on renal dialysis: Secondary | ICD-10-CM | POA: Diagnosis not present

## 2018-11-28 DIAGNOSIS — E1122 Type 2 diabetes mellitus with diabetic chronic kidney disease: Secondary | ICD-10-CM | POA: Diagnosis not present

## 2018-11-28 DIAGNOSIS — D631 Anemia in chronic kidney disease: Secondary | ICD-10-CM | POA: Diagnosis not present

## 2018-11-28 DIAGNOSIS — D509 Iron deficiency anemia, unspecified: Secondary | ICD-10-CM | POA: Diagnosis not present

## 2018-11-30 DIAGNOSIS — G4733 Obstructive sleep apnea (adult) (pediatric): Secondary | ICD-10-CM | POA: Diagnosis present

## 2018-11-30 DIAGNOSIS — N186 End stage renal disease: Secondary | ICD-10-CM | POA: Diagnosis not present

## 2018-11-30 DIAGNOSIS — Z87891 Personal history of nicotine dependence: Secondary | ICD-10-CM | POA: Diagnosis not present

## 2018-11-30 DIAGNOSIS — Z86711 Personal history of pulmonary embolism: Secondary | ICD-10-CM | POA: Diagnosis not present

## 2018-11-30 DIAGNOSIS — E785 Hyperlipidemia, unspecified: Secondary | ICD-10-CM | POA: Diagnosis present

## 2018-11-30 DIAGNOSIS — K56600 Partial intestinal obstruction, unspecified as to cause: Secondary | ICD-10-CM | POA: Diagnosis not present

## 2018-11-30 DIAGNOSIS — K861 Other chronic pancreatitis: Secondary | ICD-10-CM | POA: Diagnosis not present

## 2018-11-30 DIAGNOSIS — I5043 Acute on chronic combined systolic (congestive) and diastolic (congestive) heart failure: Secondary | ICD-10-CM | POA: Diagnosis present

## 2018-11-30 DIAGNOSIS — Z992 Dependence on renal dialysis: Secondary | ICD-10-CM | POA: Diagnosis not present

## 2018-11-30 DIAGNOSIS — Z20828 Contact with and (suspected) exposure to other viral communicable diseases: Secondary | ICD-10-CM | POA: Diagnosis present

## 2018-11-30 DIAGNOSIS — T8612 Kidney transplant failure: Secondary | ICD-10-CM | POA: Diagnosis not present

## 2018-11-30 DIAGNOSIS — R197 Diarrhea, unspecified: Secondary | ICD-10-CM | POA: Diagnosis not present

## 2018-11-30 DIAGNOSIS — R601 Generalized edema: Secondary | ICD-10-CM | POA: Diagnosis not present

## 2018-11-30 DIAGNOSIS — Z7901 Long term (current) use of anticoagulants: Secondary | ICD-10-CM | POA: Diagnosis not present

## 2018-11-30 DIAGNOSIS — K761 Chronic passive congestion of liver: Secondary | ICD-10-CM | POA: Diagnosis present

## 2018-11-30 DIAGNOSIS — E1122 Type 2 diabetes mellitus with diabetic chronic kidney disease: Secondary | ICD-10-CM | POA: Diagnosis not present

## 2018-11-30 DIAGNOSIS — K219 Gastro-esophageal reflux disease without esophagitis: Secondary | ICD-10-CM | POA: Diagnosis not present

## 2018-11-30 DIAGNOSIS — R112 Nausea with vomiting, unspecified: Secondary | ICD-10-CM | POA: Diagnosis not present

## 2018-11-30 DIAGNOSIS — Z833 Family history of diabetes mellitus: Secondary | ICD-10-CM | POA: Diagnosis not present

## 2018-11-30 DIAGNOSIS — I5020 Unspecified systolic (congestive) heart failure: Secondary | ICD-10-CM | POA: Diagnosis not present

## 2018-11-30 DIAGNOSIS — D61818 Other pancytopenia: Secondary | ICD-10-CM | POA: Diagnosis present

## 2018-11-30 DIAGNOSIS — I120 Hypertensive chronic kidney disease with stage 5 chronic kidney disease or end stage renal disease: Secondary | ICD-10-CM | POA: Diagnosis not present

## 2018-11-30 DIAGNOSIS — Z86718 Personal history of other venous thrombosis and embolism: Secondary | ICD-10-CM | POA: Diagnosis not present

## 2018-11-30 DIAGNOSIS — R109 Unspecified abdominal pain: Secondary | ICD-10-CM | POA: Diagnosis not present

## 2018-11-30 DIAGNOSIS — R110 Nausea: Secondary | ICD-10-CM | POA: Diagnosis not present

## 2018-11-30 DIAGNOSIS — R1031 Right lower quadrant pain: Secondary | ICD-10-CM | POA: Diagnosis not present

## 2018-11-30 DIAGNOSIS — M79601 Pain in right arm: Secondary | ICD-10-CM | POA: Diagnosis not present

## 2018-11-30 DIAGNOSIS — Z8249 Family history of ischemic heart disease and other diseases of the circulatory system: Secondary | ICD-10-CM | POA: Diagnosis not present

## 2018-11-30 DIAGNOSIS — G8929 Other chronic pain: Secondary | ICD-10-CM | POA: Diagnosis not present

## 2018-11-30 DIAGNOSIS — Z905 Acquired absence of kidney: Secondary | ICD-10-CM | POA: Diagnosis not present

## 2018-11-30 DIAGNOSIS — Z5329 Procedure and treatment not carried out because of patient's decision for other reasons: Secondary | ICD-10-CM | POA: Diagnosis not present

## 2018-11-30 DIAGNOSIS — Z79899 Other long term (current) drug therapy: Secondary | ICD-10-CM | POA: Diagnosis not present

## 2018-11-30 DIAGNOSIS — E8770 Fluid overload, unspecified: Secondary | ICD-10-CM | POA: Diagnosis not present

## 2018-11-30 DIAGNOSIS — F419 Anxiety disorder, unspecified: Secondary | ICD-10-CM | POA: Diagnosis not present

## 2018-11-30 DIAGNOSIS — I132 Hypertensive heart and chronic kidney disease with heart failure and with stage 5 chronic kidney disease, or end stage renal disease: Secondary | ICD-10-CM | POA: Diagnosis not present

## 2018-11-30 DIAGNOSIS — D49512 Neoplasm of unspecified behavior of left kidney: Secondary | ICD-10-CM | POA: Diagnosis not present

## 2018-11-30 DIAGNOSIS — M545 Low back pain: Secondary | ICD-10-CM | POA: Diagnosis not present

## 2018-11-30 DIAGNOSIS — R9431 Abnormal electrocardiogram [ECG] [EKG]: Secondary | ICD-10-CM | POA: Diagnosis not present

## 2018-11-30 DIAGNOSIS — Z79891 Long term (current) use of opiate analgesic: Secondary | ICD-10-CM | POA: Diagnosis not present

## 2018-11-30 DIAGNOSIS — D631 Anemia in chronic kidney disease: Secondary | ICD-10-CM | POA: Diagnosis not present

## 2018-11-30 DIAGNOSIS — I959 Hypotension, unspecified: Secondary | ICD-10-CM | POA: Diagnosis not present

## 2018-12-02 DIAGNOSIS — G8929 Other chronic pain: Secondary | ICD-10-CM | POA: Diagnosis not present

## 2018-12-02 DIAGNOSIS — Z79891 Long term (current) use of opiate analgesic: Secondary | ICD-10-CM | POA: Diagnosis not present

## 2018-12-02 DIAGNOSIS — M545 Low back pain: Secondary | ICD-10-CM | POA: Diagnosis not present

## 2018-12-02 DIAGNOSIS — Z79899 Other long term (current) drug therapy: Secondary | ICD-10-CM | POA: Diagnosis not present

## 2018-12-03 DIAGNOSIS — E1122 Type 2 diabetes mellitus with diabetic chronic kidney disease: Secondary | ICD-10-CM | POA: Diagnosis not present

## 2018-12-03 DIAGNOSIS — D509 Iron deficiency anemia, unspecified: Secondary | ICD-10-CM | POA: Diagnosis not present

## 2018-12-03 DIAGNOSIS — D631 Anemia in chronic kidney disease: Secondary | ICD-10-CM | POA: Diagnosis not present

## 2018-12-03 DIAGNOSIS — N2581 Secondary hyperparathyroidism of renal origin: Secondary | ICD-10-CM | POA: Diagnosis not present

## 2018-12-03 DIAGNOSIS — N186 End stage renal disease: Secondary | ICD-10-CM | POA: Diagnosis not present

## 2018-12-03 DIAGNOSIS — Z992 Dependence on renal dialysis: Secondary | ICD-10-CM | POA: Diagnosis not present

## 2018-12-04 DIAGNOSIS — I5022 Chronic systolic (congestive) heart failure: Secondary | ICD-10-CM | POA: Diagnosis not present

## 2018-12-04 DIAGNOSIS — R1084 Generalized abdominal pain: Secondary | ICD-10-CM | POA: Diagnosis not present

## 2018-12-04 DIAGNOSIS — R05 Cough: Secondary | ICD-10-CM | POA: Diagnosis not present

## 2018-12-04 DIAGNOSIS — R0602 Shortness of breath: Secondary | ICD-10-CM | POA: Diagnosis not present

## 2018-12-04 DIAGNOSIS — Z20828 Contact with and (suspected) exposure to other viral communicable diseases: Secondary | ICD-10-CM | POA: Diagnosis not present

## 2018-12-04 DIAGNOSIS — K219 Gastro-esophageal reflux disease without esophagitis: Secondary | ICD-10-CM | POA: Diagnosis not present

## 2018-12-04 DIAGNOSIS — Z992 Dependence on renal dialysis: Secondary | ICD-10-CM | POA: Diagnosis not present

## 2018-12-04 DIAGNOSIS — Z7901 Long term (current) use of anticoagulants: Secondary | ICD-10-CM | POA: Diagnosis not present

## 2018-12-04 DIAGNOSIS — I313 Pericardial effusion (noninflammatory): Secondary | ICD-10-CM | POA: Diagnosis not present

## 2018-12-04 DIAGNOSIS — E1122 Type 2 diabetes mellitus with diabetic chronic kidney disease: Secondary | ICD-10-CM | POA: Diagnosis not present

## 2018-12-04 DIAGNOSIS — J984 Other disorders of lung: Secondary | ICD-10-CM | POA: Diagnosis not present

## 2018-12-04 DIAGNOSIS — I132 Hypertensive heart and chronic kidney disease with heart failure and with stage 5 chronic kidney disease, or end stage renal disease: Secondary | ICD-10-CM | POA: Diagnosis not present

## 2018-12-04 DIAGNOSIS — Z79899 Other long term (current) drug therapy: Secondary | ICD-10-CM | POA: Diagnosis not present

## 2018-12-04 DIAGNOSIS — I120 Hypertensive chronic kidney disease with stage 5 chronic kidney disease or end stage renal disease: Secondary | ICD-10-CM | POA: Diagnosis not present

## 2018-12-04 DIAGNOSIS — D631 Anemia in chronic kidney disease: Secondary | ICD-10-CM | POA: Diagnosis not present

## 2018-12-04 DIAGNOSIS — R5383 Other fatigue: Secondary | ICD-10-CM | POA: Diagnosis not present

## 2018-12-04 DIAGNOSIS — I517 Cardiomegaly: Secondary | ICD-10-CM | POA: Diagnosis not present

## 2018-12-04 DIAGNOSIS — J90 Pleural effusion, not elsewhere classified: Secondary | ICD-10-CM | POA: Diagnosis not present

## 2018-12-04 DIAGNOSIS — G4733 Obstructive sleep apnea (adult) (pediatric): Secondary | ICD-10-CM | POA: Diagnosis not present

## 2018-12-04 DIAGNOSIS — R112 Nausea with vomiting, unspecified: Secondary | ICD-10-CM | POA: Diagnosis not present

## 2018-12-04 DIAGNOSIS — Z888 Allergy status to other drugs, medicaments and biological substances status: Secondary | ICD-10-CM | POA: Diagnosis not present

## 2018-12-04 DIAGNOSIS — Z8719 Personal history of other diseases of the digestive system: Secondary | ICD-10-CM | POA: Diagnosis not present

## 2018-12-04 DIAGNOSIS — K5730 Diverticulosis of large intestine without perforation or abscess without bleeding: Secondary | ICD-10-CM | POA: Diagnosis not present

## 2018-12-04 DIAGNOSIS — N186 End stage renal disease: Secondary | ICD-10-CM | POA: Diagnosis not present

## 2018-12-05 DIAGNOSIS — R112 Nausea with vomiting, unspecified: Secondary | ICD-10-CM | POA: Diagnosis not present

## 2018-12-06 ENCOUNTER — Emergency Department (HOSPITAL_COMMUNITY)
Admission: EM | Admit: 2018-12-06 | Discharge: 2018-12-06 | Discharge status: Against Medical Advice | Payer: Medicare Other | Attending: Emergency Medicine | Admitting: Emergency Medicine

## 2018-12-06 ENCOUNTER — Encounter (HOSPITAL_COMMUNITY): Payer: Self-pay | Admitting: Emergency Medicine

## 2018-12-06 ENCOUNTER — Emergency Department (HOSPITAL_COMMUNITY)
Admission: EM | Admit: 2018-12-06 | Discharge: 2018-12-06 | Disposition: A | Discharge status: Home / Self Care | Payer: Medicare Other | Source: Home / Self Care | Attending: Emergency Medicine | Admitting: Emergency Medicine

## 2018-12-06 ENCOUNTER — Emergency Department (HOSPITAL_COMMUNITY): Payer: Medicare Other

## 2018-12-06 ENCOUNTER — Other Ambulatory Visit: Payer: Self-pay

## 2018-12-06 DIAGNOSIS — I120 Hypertensive chronic kidney disease with stage 5 chronic kidney disease or end stage renal disease: Secondary | ICD-10-CM | POA: Diagnosis not present

## 2018-12-06 DIAGNOSIS — R0602 Shortness of breath: Secondary | ICD-10-CM | POA: Diagnosis not present

## 2018-12-06 DIAGNOSIS — Z5320 Procedure and treatment not carried out because of patient's decision for unspecified reasons: Secondary | ICD-10-CM | POA: Diagnosis not present

## 2018-12-06 DIAGNOSIS — I10 Essential (primary) hypertension: Secondary | ICD-10-CM | POA: Diagnosis not present

## 2018-12-06 DIAGNOSIS — G8929 Other chronic pain: Secondary | ICD-10-CM

## 2018-12-06 DIAGNOSIS — Z992 Dependence on renal dialysis: Secondary | ICD-10-CM

## 2018-12-06 DIAGNOSIS — Z7901 Long term (current) use of anticoagulants: Secondary | ICD-10-CM | POA: Diagnosis not present

## 2018-12-06 DIAGNOSIS — E1122 Type 2 diabetes mellitus with diabetic chronic kidney disease: Secondary | ICD-10-CM | POA: Insufficient documentation

## 2018-12-06 DIAGNOSIS — R1030 Lower abdominal pain, unspecified: Secondary | ICD-10-CM | POA: Diagnosis not present

## 2018-12-06 DIAGNOSIS — R109 Unspecified abdominal pain: Secondary | ICD-10-CM | POA: Insufficient documentation

## 2018-12-06 DIAGNOSIS — R112 Nausea with vomiting, unspecified: Secondary | ICD-10-CM | POA: Insufficient documentation

## 2018-12-06 DIAGNOSIS — Z87891 Personal history of nicotine dependence: Secondary | ICD-10-CM | POA: Insufficient documentation

## 2018-12-06 DIAGNOSIS — R1084 Generalized abdominal pain: Secondary | ICD-10-CM | POA: Diagnosis not present

## 2018-12-06 DIAGNOSIS — Z79899 Other long term (current) drug therapy: Secondary | ICD-10-CM | POA: Insufficient documentation

## 2018-12-06 DIAGNOSIS — N186 End stage renal disease: Secondary | ICD-10-CM | POA: Insufficient documentation

## 2018-12-06 DIAGNOSIS — R52 Pain, unspecified: Secondary | ICD-10-CM | POA: Diagnosis not present

## 2018-12-06 DIAGNOSIS — I132 Hypertensive heart and chronic kidney disease with heart failure and with stage 5 chronic kidney disease, or end stage renal disease: Secondary | ICD-10-CM | POA: Insufficient documentation

## 2018-12-06 DIAGNOSIS — I160 Hypertensive urgency: Secondary | ICD-10-CM

## 2018-12-06 DIAGNOSIS — I5042 Chronic combined systolic (congestive) and diastolic (congestive) heart failure: Secondary | ICD-10-CM | POA: Insufficient documentation

## 2018-12-06 LAB — CBC WITH DIFFERENTIAL/PLATELET
Abs Immature Granulocytes: 0.01 10*3/uL (ref 0.00–0.07)
Basophils Absolute: 0 10*3/uL (ref 0.0–0.1)
Basophils Relative: 1 %
Eosinophils Absolute: 0.2 10*3/uL (ref 0.0–0.5)
Eosinophils Relative: 5 %
HCT: 27 % — ABNORMAL LOW (ref 39.0–52.0)
Hemoglobin: 8.7 g/dL — ABNORMAL LOW (ref 13.0–17.0)
Immature Granulocytes: 0 %
Lymphocytes Relative: 17 %
Lymphs Abs: 0.6 10*3/uL — ABNORMAL LOW (ref 0.7–4.0)
MCH: 28.3 pg (ref 26.0–34.0)
MCHC: 32.2 g/dL (ref 30.0–36.0)
MCV: 87.9 fL (ref 80.0–100.0)
Monocytes Absolute: 0.6 10*3/uL (ref 0.1–1.0)
Monocytes Relative: 16 %
Neutro Abs: 2.3 10*3/uL (ref 1.7–7.7)
Neutrophils Relative %: 61 %
Platelets: 143 10*3/uL — ABNORMAL LOW (ref 150–400)
RBC: 3.07 MIL/uL — ABNORMAL LOW (ref 4.22–5.81)
RDW: 19.6 % — ABNORMAL HIGH (ref 11.5–15.5)
WBC: 3.7 10*3/uL — ABNORMAL LOW (ref 4.0–10.5)
nRBC: 0 % (ref 0.0–0.2)

## 2018-12-06 LAB — COMPREHENSIVE METABOLIC PANEL
ALT: 11 U/L (ref 0–44)
AST: 24 U/L (ref 15–41)
Albumin: 2.9 g/dL — ABNORMAL LOW (ref 3.5–5.0)
Alkaline Phosphatase: 143 U/L — ABNORMAL HIGH (ref 38–126)
Anion gap: 18 — ABNORMAL HIGH (ref 5–15)
BUN: 37 mg/dL — ABNORMAL HIGH (ref 6–20)
CO2: 23 mmol/L (ref 22–32)
Calcium: 8.8 mg/dL — ABNORMAL LOW (ref 8.9–10.3)
Chloride: 97 mmol/L — ABNORMAL LOW (ref 98–111)
Creatinine, Ser: 8.36 mg/dL — ABNORMAL HIGH (ref 0.61–1.24)
GFR calc Af Amer: 8 mL/min — ABNORMAL LOW (ref >60)
GFR calc non Af Amer: 7 mL/min — ABNORMAL LOW (ref >60)
Glucose, Bld: 63 mg/dL — ABNORMAL LOW (ref 70–99)
Potassium: 4.1 mmol/L (ref 3.5–5.1)
Sodium: 138 mmol/L (ref 135–145)
Total Bilirubin: 0.8 mg/dL (ref 0.3–1.2)
Total Protein: 8 g/dL (ref 6.5–8.1)

## 2018-12-06 LAB — HEPATIC FUNCTION PANEL
ALT: 10 U/L (ref 0–44)
AST: 22 U/L (ref 15–41)
Albumin: 2.9 g/dL — ABNORMAL LOW (ref 3.5–5.0)
Alkaline Phosphatase: 136 U/L — ABNORMAL HIGH (ref 38–126)
Bilirubin, Direct: 0.2 mg/dL (ref 0.0–0.2)
Indirect Bilirubin: 0.6 mg/dL (ref 0.3–0.9)
Total Bilirubin: 0.8 mg/dL (ref 0.3–1.2)
Total Protein: 8.5 g/dL — ABNORMAL HIGH (ref 6.5–8.1)

## 2018-12-06 LAB — BASIC METABOLIC PANEL
Anion gap: 21 — ABNORMAL HIGH (ref 5–15)
BUN: 36 mg/dL — ABNORMAL HIGH (ref 6–20)
CO2: 24 mmol/L (ref 22–32)
Calcium: 8.9 mg/dL (ref 8.9–10.3)
Chloride: 95 mmol/L — ABNORMAL LOW (ref 98–111)
Creatinine, Ser: 8 mg/dL — ABNORMAL HIGH (ref 0.61–1.24)
GFR calc Af Amer: 8 mL/min — ABNORMAL LOW (ref >60)
GFR calc non Af Amer: 7 mL/min — ABNORMAL LOW (ref >60)
Glucose, Bld: 80 mg/dL (ref 70–99)
Potassium: 4.5 mmol/L (ref 3.5–5.1)
Sodium: 140 mmol/L (ref 135–145)

## 2018-12-06 LAB — CBC
HCT: 27.6 % — ABNORMAL LOW (ref 39.0–52.0)
Hemoglobin: 8.8 g/dL — ABNORMAL LOW (ref 13.0–17.0)
MCH: 28 pg (ref 26.0–34.0)
MCHC: 31.9 g/dL (ref 30.0–36.0)
MCV: 87.9 fL (ref 80.0–100.0)
Platelets: 135 10*3/uL — ABNORMAL LOW (ref 150–400)
RBC: 3.14 MIL/uL — ABNORMAL LOW (ref 4.22–5.81)
RDW: 19.4 % — ABNORMAL HIGH (ref 11.5–15.5)
WBC: 3.7 10*3/uL — ABNORMAL LOW (ref 4.0–10.5)
nRBC: 0 % (ref 0.0–0.2)

## 2018-12-06 LAB — LIPASE, BLOOD
Lipase: 51 U/L (ref 11–51)
Lipase: 66 U/L — ABNORMAL HIGH (ref 11–51)

## 2018-12-06 MED ORDER — APIXABAN 5 MG PO TABS
5.00 | ORAL_TABLET | ORAL | Status: DC
Start: 2018-12-05 — End: 2018-12-06

## 2018-12-06 MED ORDER — HYDRALAZINE HCL 25 MG PO TABS
50.0000 mg | ORAL_TABLET | Freq: Once | ORAL | Status: AC
  Administered 2018-12-06: 17:00:00 50 mg via ORAL
  Filled 2018-12-06 (×2): qty 2

## 2018-12-06 MED ORDER — BENZONATATE 100 MG PO CAPS
100.00 | ORAL_CAPSULE | ORAL | Status: DC
Start: ? — End: 2018-12-06

## 2018-12-06 MED ORDER — NITROGLYCERIN 0.4 MG SL SUBL
0.40 | SUBLINGUAL_TABLET | SUBLINGUAL | Status: DC
Start: ? — End: 2018-12-06

## 2018-12-06 MED ORDER — ACETAMINOPHEN 325 MG PO TABS
650.00 | ORAL_TABLET | ORAL | Status: DC
Start: ? — End: 2018-12-06

## 2018-12-06 MED ORDER — OXYCODONE HCL ER 10 MG PO T12A: 10.0000 mg | EXTENDED_RELEASE_TABLET | Freq: Two times a day (BID) | ORAL | Status: DC

## 2018-12-06 MED ORDER — POLYETHYLENE GLYCOL 3350 17 G PO PACK
17.00 | PACK | ORAL | Status: DC
Start: ? — End: 2018-12-06

## 2018-12-06 MED ORDER — NEPHRO-VITE 0.8 MG PO TABS
1.00 | ORAL_TABLET | ORAL | Status: DC
Start: 2018-12-06 — End: 2018-12-06

## 2018-12-06 MED ORDER — HYDROMORPHONE HCL 1 MG/ML IJ SOLN
1.00 | INTRAMUSCULAR | Status: DC
Start: ? — End: 2018-12-06

## 2018-12-06 MED ORDER — SODIUM THIOSULFATE 25 % IV SOLN
25.00 | INTRAVENOUS | Status: DC
Start: 2018-12-07 — End: 2018-12-06

## 2018-12-06 MED ORDER — SODIUM CHLORIDE 0.9 % IV SOLN
10.00 | INTRAVENOUS | Status: DC
Start: ? — End: 2018-12-06

## 2018-12-06 MED ORDER — PANTOPRAZOLE SODIUM 40 MG PO TBEC
40.00 | DELAYED_RELEASE_TABLET | ORAL | Status: DC
Start: 2018-12-05 — End: 2018-12-06

## 2018-12-06 MED ORDER — GENERIC EXTERNAL MEDICATION
Status: DC
Start: 2018-12-05 — End: 2018-12-06

## 2018-12-06 MED ORDER — AMLODIPINE BESYLATE 5 MG PO TABS
5.0000 mg | ORAL_TABLET | Freq: Once | ORAL | Status: AC
  Administered 2018-12-06: 5 mg via ORAL
  Filled 2018-12-06 (×2): qty 1

## 2018-12-06 MED ORDER — ALBUTEROL SULFATE (2.5 MG/3ML) 0.083% IN NEBU
2.50 | INHALATION_SOLUTION | RESPIRATORY_TRACT | Status: DC
Start: ? — End: 2018-12-06

## 2018-12-06 MED ORDER — LABETALOL HCL 5 MG/ML IV SOLN
20.0000 mg | Freq: Once | INTRAVENOUS | Status: AC
  Administered 2018-12-06: 20 mg via INTRAVENOUS
  Filled 2018-12-06: qty 4

## 2018-12-06 MED ORDER — LABETALOL HCL 5 MG/ML IV SOLN
40.0000 mg | Freq: Once | INTRAVENOUS | Status: AC
  Administered 2018-12-06: 40 mg via INTRAVENOUS
  Filled 2018-12-06: qty 8

## 2018-12-06 MED ORDER — ZOLPIDEM TARTRATE 5 MG PO TABS
5.00 | ORAL_TABLET | ORAL | Status: DC
Start: ? — End: 2018-12-06

## 2018-12-06 MED ORDER — HYDRALAZINE HCL 25 MG PO TABS
100.00 | ORAL_TABLET | ORAL | Status: DC
Start: 2018-12-05 — End: 2018-12-06

## 2018-12-06 MED ORDER — GENERIC EXTERNAL MEDICATION
Status: DC
Start: ? — End: 2018-12-06

## 2018-12-06 MED ORDER — PROCHLORPERAZINE EDISYLATE 10 MG/2ML IJ SOLN
10.0000 mg | Freq: Once | INTRAMUSCULAR | Status: AC
  Administered 2018-12-06: 10 mg via INTRAVENOUS
  Filled 2018-12-06: qty 2

## 2018-12-06 MED ORDER — DIPHENHYDRAMINE HCL 50 MG/ML IJ SOLN
12.50 | INTRAMUSCULAR | Status: DC
Start: ? — End: 2018-12-06

## 2018-12-06 MED ORDER — OXYCODONE HCL 5 MG PO TABS
10.0000 mg | ORAL_TABLET | Freq: Once | ORAL | Status: AC
  Administered 2018-12-06: 07:00:00 10 mg via ORAL
  Filled 2018-12-06: qty 2

## 2018-12-06 MED ORDER — LABETALOL HCL 5 MG/ML IV SOLN: 80.0000 mg | Freq: Once | INTRAVENOUS | Status: DC | PRN

## 2018-12-06 MED ORDER — DICYCLOMINE HCL 20 MG PO TABS
20.00 | ORAL_TABLET | ORAL | Status: DC
Start: ? — End: 2018-12-06

## 2018-12-06 MED ORDER — PANCRELIPASE (LIP-PROT-AMYL) 12000-38000 UNITS PO CPEP
12000.00 | ORAL_CAPSULE | ORAL | Status: DC
Start: 2018-12-05 — End: 2018-12-06

## 2018-12-06 MED ORDER — ONDANSETRON HCL 4 MG/2ML IJ SOLN
4.0000 mg | Freq: Once | INTRAMUSCULAR | Status: AC | PRN
  Administered 2018-12-06: 4 mg via INTRAVENOUS
  Filled 2018-12-06: qty 2

## 2018-12-06 NOTE — ED Notes (Signed)
Pt refusing PO BP meds.  Asked what kind of pain med doctor ordered and was told it was PO med.  Pt states he does not want PO med wants an IV med.  Advised Dr. Kathrynn Humble.

## 2018-12-06 NOTE — ED Notes (Signed)
Pt just ate sandwich and applesauce.  Requesting home BP meds now.

## 2018-12-06 NOTE — ED Notes (Signed)
Pt state he feels much better and refuses to stay on the ED while we get his lab results, pt oriented to stay by this RN and Dr. Leonides Schanz and he decided to leave AMA.

## 2018-12-06 NOTE — ED Provider Notes (Signed)
  Physical Exam  BP (!) 229/138 (BP Location: Right Leg)   Pulse 81   Temp 97.7 F (36.5 C) (Oral)   Resp 10   Ht 6' 4" (1.93 m)   Wt 74 kg   SpO2 97%   BMI 19.86 kg/m   Physical Exam  ED Course/Procedures     .Critical Care Performed by: Varney Biles, MD Authorized by: Varney Biles, MD   Critical care provider statement:    Critical care time (minutes):  31   Critical care was necessary to treat or prevent imminent or life-threatening deterioration of the following conditions:  Circulatory failure   Critical care was time spent personally by me on the following activities:  Discussions with consultants, evaluation of patient's response to treatment, examination of patient, ordering and performing treatments and interventions, ordering and review of laboratory studies, ordering and review of radiographic studies, pulse oximetry, re-evaluation of patient's condition, obtaining history from patient or surrogate and review of old charts    MDM    Assuming care of patient from Dr. Rogene Houston.   Patient in the ED for DIB.He has hx of ESRD on HD ,missed y'days HD but is scheduled for HD tomorrow. Workup thus far shows - baseline lab value for the pt.  Concerning findings are as following : none. Important pending results are : CXR.  According to Dr. Rogene Houston, plan is to ensure CXR looks fine and there is no volume overload. His abd and cardiopulm exam are normal.   Patient had no complains, no concerns from the nursing side. Will continue to monitor.  Vitals:   12/06/18 1258 12/06/18 1505  BP: (!) 246/113 (!) 229/138  Pulse: 81   Resp: 14 10  Temp: 97.7 F (36.5 C)   SpO2: 98% 97%    BP is significantly elevated.  He denies any chest pain.  He has shortness of breath, but there is no tachypnea, hypoxia, respiratory distress at rest. We will give him IV labetalol and oral hydralazine.  Patient has not taken his home BP meds.  4:58 PM Patient continues to  refuse BP meds and oral pain meds.  He wants IV medications, which I do not see any indication to give him.  He reports that the pain is all over his body.  Patient has received multiple doses of IV labetalol.  He has no chest pain, new headache, new neurologic symptoms, x-rays not showing pulmonary edema -therefore by definition is not in hypertensive emergency.  He is refusing oral oxycodone and oral amlodipine plus hydralazine, which are his home meds.  Patient informed me that he has not taken his home meds.  Patient is refusing critical blood pressure medications against medical advice. Patient understands that his actions will put him at at risk of complications of malignant hypertension such as brain bleed, stroke, heart attack and potentially death.  Alternative options discussed.  Taking oral pain meds along with the IV meds. Opportunity to change mind given. Discussion witnessed by primary RN Ms. March Rummage. Patient is demonstrating good capacity to make decision. Patient understands that he/she needs to return to the ER immediately if his/her symptoms get worse.         Varney Biles, MD 12/06/18 1701

## 2018-12-06 NOTE — ED Notes (Signed)
Dr. Kathrynn Humble in again to speak with patient.  Refused home meds.  Dr. Kathrynn Humble left and pt states that if he has something to eat he will take home meds.

## 2018-12-06 NOTE — ED Triage Notes (Signed)
HD pt c/o nausea and vomiting and high potassium.

## 2018-12-06 NOTE — ED Notes (Signed)
Dr. Kathrynn Humble at bedside giving discharge instructions.
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
## 2018-12-06 NOTE — Discharge Instructions (Signed)
Please make sure you take your blood pressure medications as prescribed and get your hemodialysis tomorrow.

## 2018-12-06 NOTE — ED Notes (Signed)
Pt sitting on side of bed sleeping.  Arouses easily to voice.

## 2018-12-06 NOTE — ED Triage Notes (Signed)
Pt seen earlier today for same, here via [facility] for gen. weakness and intermittent SOB.  Several episodes of emesis with right side abdominal pain.

## 2018-12-06 NOTE — ED Notes (Signed)
Pt reports he does not make any urine.
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
## 2018-12-06 NOTE — ED Notes (Signed)
Pt refused X-ray when tech went in to capture it.

## 2018-12-06 NOTE — ED Notes (Signed)
PO challenge started, pt have some of the fluids down no sign of nausea or vomiting.  Pt disconnected him self from the monitor and Vitals sign and refuses to be connected back.

## 2018-12-06 NOTE — ED Notes (Addendum)
After pt discharged 1 tab of Hydralazine found on floor where pt was sitting.  Discarded pill.

## 2018-12-07 ENCOUNTER — Encounter (HOSPITAL_BASED_OUTPATIENT_CLINIC_OR_DEPARTMENT_OTHER): Payer: Medicare Other | Attending: Internal Medicine

## 2018-12-07 DIAGNOSIS — K5900 Constipation, unspecified: Secondary | ICD-10-CM | POA: Diagnosis not present

## 2018-12-07 DIAGNOSIS — Z885 Allergy status to narcotic agent status: Secondary | ICD-10-CM | POA: Diagnosis not present

## 2018-12-07 DIAGNOSIS — Z888 Allergy status to other drugs, medicaments and biological substances status: Secondary | ICD-10-CM | POA: Diagnosis not present

## 2018-12-07 DIAGNOSIS — Z881 Allergy status to other antibiotic agents status: Secondary | ICD-10-CM | POA: Diagnosis not present

## 2018-12-07 DIAGNOSIS — I120 Hypertensive chronic kidney disease with stage 5 chronic kidney disease or end stage renal disease: Secondary | ICD-10-CM | POA: Diagnosis not present

## 2018-12-07 DIAGNOSIS — Z992 Dependence on renal dialysis: Secondary | ICD-10-CM | POA: Diagnosis not present

## 2018-12-07 DIAGNOSIS — K861 Other chronic pancreatitis: Secondary | ICD-10-CM | POA: Diagnosis not present

## 2018-12-07 DIAGNOSIS — E119 Type 2 diabetes mellitus without complications: Secondary | ICD-10-CM | POA: Diagnosis not present

## 2018-12-07 DIAGNOSIS — Z86718 Personal history of other venous thrombosis and embolism: Secondary | ICD-10-CM | POA: Diagnosis not present

## 2018-12-07 DIAGNOSIS — R1084 Generalized abdominal pain: Secondary | ICD-10-CM | POA: Diagnosis not present

## 2018-12-07 DIAGNOSIS — R109 Unspecified abdominal pain: Secondary | ICD-10-CM | POA: Diagnosis not present

## 2018-12-07 DIAGNOSIS — D631 Anemia in chronic kidney disease: Secondary | ICD-10-CM | POA: Diagnosis not present

## 2018-12-07 DIAGNOSIS — Z79899 Other long term (current) drug therapy: Secondary | ICD-10-CM | POA: Diagnosis not present

## 2018-12-07 DIAGNOSIS — D509 Iron deficiency anemia, unspecified: Secondary | ICD-10-CM | POA: Diagnosis not present

## 2018-12-07 DIAGNOSIS — G4733 Obstructive sleep apnea (adult) (pediatric): Secondary | ICD-10-CM | POA: Diagnosis not present

## 2018-12-07 DIAGNOSIS — R9431 Abnormal electrocardiogram [ECG] [EKG]: Secondary | ICD-10-CM | POA: Diagnosis not present

## 2018-12-07 DIAGNOSIS — G8929 Other chronic pain: Secondary | ICD-10-CM | POA: Diagnosis not present

## 2018-12-07 DIAGNOSIS — N2581 Secondary hyperparathyroidism of renal origin: Secondary | ICD-10-CM | POA: Diagnosis not present

## 2018-12-07 DIAGNOSIS — Z7901 Long term (current) use of anticoagulants: Secondary | ICD-10-CM | POA: Diagnosis not present

## 2018-12-07 DIAGNOSIS — R112 Nausea with vomiting, unspecified: Secondary | ICD-10-CM | POA: Diagnosis not present

## 2018-12-07 DIAGNOSIS — Z7952 Long term (current) use of systemic steroids: Secondary | ICD-10-CM | POA: Diagnosis not present

## 2018-12-07 DIAGNOSIS — N186 End stage renal disease: Secondary | ICD-10-CM | POA: Diagnosis not present

## 2018-12-07 DIAGNOSIS — K6289 Other specified diseases of anus and rectum: Secondary | ICD-10-CM | POA: Diagnosis not present

## 2018-12-07 DIAGNOSIS — E1122 Type 2 diabetes mellitus with diabetic chronic kidney disease: Secondary | ICD-10-CM | POA: Diagnosis not present

## 2018-12-08 DIAGNOSIS — D631 Anemia in chronic kidney disease: Secondary | ICD-10-CM | POA: Diagnosis not present

## 2018-12-08 DIAGNOSIS — S5001XA Contusion of right elbow, initial encounter: Secondary | ICD-10-CM | POA: Diagnosis not present

## 2018-12-08 DIAGNOSIS — G8929 Other chronic pain: Secondary | ICD-10-CM | POA: Diagnosis not present

## 2018-12-08 DIAGNOSIS — S300XXA Contusion of lower back and pelvis, initial encounter: Secondary | ICD-10-CM | POA: Diagnosis not present

## 2018-12-08 DIAGNOSIS — R1084 Generalized abdominal pain: Secondary | ICD-10-CM | POA: Diagnosis not present

## 2018-12-08 DIAGNOSIS — S5002XA Contusion of left elbow, initial encounter: Secondary | ICD-10-CM | POA: Diagnosis not present

## 2018-12-08 DIAGNOSIS — R112 Nausea with vomiting, unspecified: Secondary | ICD-10-CM | POA: Diagnosis not present

## 2018-12-08 DIAGNOSIS — M549 Dorsalgia, unspecified: Secondary | ICD-10-CM | POA: Diagnosis not present

## 2018-12-08 DIAGNOSIS — I517 Cardiomegaly: Secondary | ICD-10-CM | POA: Diagnosis not present

## 2018-12-09 ENCOUNTER — Emergency Department (HOSPITAL_BASED_OUTPATIENT_CLINIC_OR_DEPARTMENT_OTHER): Payer: Medicare Other

## 2018-12-09 ENCOUNTER — Emergency Department (HOSPITAL_BASED_OUTPATIENT_CLINIC_OR_DEPARTMENT_OTHER)
Admission: EM | Admit: 2018-12-09 | Discharge: 2018-12-09 | Disposition: A | Discharge status: Home / Self Care | Payer: Medicare Other | Attending: Emergency Medicine | Admitting: Emergency Medicine

## 2018-12-09 ENCOUNTER — Other Ambulatory Visit: Payer: Self-pay

## 2018-12-09 ENCOUNTER — Encounter (HOSPITAL_BASED_OUTPATIENT_CLINIC_OR_DEPARTMENT_OTHER): Payer: Self-pay | Admitting: Emergency Medicine

## 2018-12-09 DIAGNOSIS — Z87891 Personal history of nicotine dependence: Secondary | ICD-10-CM | POA: Insufficient documentation

## 2018-12-09 DIAGNOSIS — M7989 Other specified soft tissue disorders: Secondary | ICD-10-CM | POA: Diagnosis not present

## 2018-12-09 DIAGNOSIS — Y999 Unspecified external cause status: Secondary | ICD-10-CM | POA: Diagnosis not present

## 2018-12-09 DIAGNOSIS — W06XXXA Fall from bed, initial encounter: Secondary | ICD-10-CM | POA: Diagnosis not present

## 2018-12-09 DIAGNOSIS — S5001XA Contusion of right elbow, initial encounter: Secondary | ICD-10-CM

## 2018-12-09 DIAGNOSIS — I132 Hypertensive heart and chronic kidney disease with heart failure and with stage 5 chronic kidney disease, or end stage renal disease: Secondary | ICD-10-CM | POA: Insufficient documentation

## 2018-12-09 DIAGNOSIS — S5002XA Contusion of left elbow, initial encounter: Secondary | ICD-10-CM | POA: Insufficient documentation

## 2018-12-09 DIAGNOSIS — D631 Anemia in chronic kidney disease: Secondary | ICD-10-CM | POA: Insufficient documentation

## 2018-12-09 DIAGNOSIS — N186 End stage renal disease: Secondary | ICD-10-CM | POA: Diagnosis not present

## 2018-12-09 DIAGNOSIS — R52 Pain, unspecified: Secondary | ICD-10-CM | POA: Diagnosis not present

## 2018-12-09 DIAGNOSIS — R112 Nausea with vomiting, unspecified: Secondary | ICD-10-CM

## 2018-12-09 DIAGNOSIS — Y929 Unspecified place or not applicable: Secondary | ICD-10-CM | POA: Diagnosis not present

## 2018-12-09 DIAGNOSIS — S59902A Unspecified injury of left elbow, initial encounter: Secondary | ICD-10-CM | POA: Diagnosis not present

## 2018-12-09 DIAGNOSIS — E1122 Type 2 diabetes mellitus with diabetic chronic kidney disease: Secondary | ICD-10-CM | POA: Diagnosis not present

## 2018-12-09 DIAGNOSIS — I5042 Chronic combined systolic (congestive) and diastolic (congestive) heart failure: Secondary | ICD-10-CM | POA: Diagnosis not present

## 2018-12-09 DIAGNOSIS — R748 Abnormal levels of other serum enzymes: Secondary | ICD-10-CM | POA: Diagnosis not present

## 2018-12-09 DIAGNOSIS — Z79899 Other long term (current) drug therapy: Secondary | ICD-10-CM | POA: Insufficient documentation

## 2018-12-09 DIAGNOSIS — S300XXA Contusion of lower back and pelvis, initial encounter: Secondary | ICD-10-CM | POA: Diagnosis not present

## 2018-12-09 DIAGNOSIS — D509 Iron deficiency anemia, unspecified: Secondary | ICD-10-CM | POA: Diagnosis not present

## 2018-12-09 DIAGNOSIS — Z7901 Long term (current) use of anticoagulants: Secondary | ICD-10-CM | POA: Diagnosis not present

## 2018-12-09 DIAGNOSIS — N2581 Secondary hyperparathyroidism of renal origin: Secondary | ICD-10-CM | POA: Diagnosis not present

## 2018-12-09 DIAGNOSIS — S3992XA Unspecified injury of lower back, initial encounter: Secondary | ICD-10-CM | POA: Diagnosis not present

## 2018-12-09 DIAGNOSIS — Y939 Activity, unspecified: Secondary | ICD-10-CM | POA: Insufficient documentation

## 2018-12-09 DIAGNOSIS — M5489 Other dorsalgia: Secondary | ICD-10-CM | POA: Diagnosis not present

## 2018-12-09 DIAGNOSIS — G8929 Other chronic pain: Secondary | ICD-10-CM | POA: Insufficient documentation

## 2018-12-09 DIAGNOSIS — R109 Unspecified abdominal pain: Secondary | ICD-10-CM | POA: Diagnosis not present

## 2018-12-09 DIAGNOSIS — N189 Chronic kidney disease, unspecified: Secondary | ICD-10-CM

## 2018-12-09 DIAGNOSIS — S59901A Unspecified injury of right elbow, initial encounter: Secondary | ICD-10-CM | POA: Diagnosis present

## 2018-12-09 DIAGNOSIS — I10 Essential (primary) hypertension: Secondary | ICD-10-CM

## 2018-12-09 DIAGNOSIS — W19XXXA Unspecified fall, initial encounter: Secondary | ICD-10-CM | POA: Diagnosis not present

## 2018-12-09 DIAGNOSIS — Z992 Dependence on renal dialysis: Secondary | ICD-10-CM | POA: Diagnosis not present

## 2018-12-09 LAB — CBC WITH DIFFERENTIAL/PLATELET
Abs Immature Granulocytes: 0.02 10*3/uL (ref 0.00–0.07)
Basophils Absolute: 0 10*3/uL (ref 0.0–0.1)
Basophils Relative: 1 %
Eosinophils Absolute: 0.2 10*3/uL (ref 0.0–0.5)
Eosinophils Relative: 6 %
HCT: 29.3 % — ABNORMAL LOW (ref 39.0–52.0)
Hemoglobin: 8.8 g/dL — ABNORMAL LOW (ref 13.0–17.0)
Immature Granulocytes: 1 %
Lymphocytes Relative: 15 %
Lymphs Abs: 0.6 10*3/uL — ABNORMAL LOW (ref 0.7–4.0)
MCH: 28.4 pg (ref 26.0–34.0)
MCHC: 30 g/dL (ref 30.0–36.0)
MCV: 94.5 fL (ref 80.0–100.0)
Monocytes Absolute: 0.5 10*3/uL (ref 0.1–1.0)
Monocytes Relative: 14 %
Neutro Abs: 2.4 10*3/uL (ref 1.7–7.7)
Neutrophils Relative %: 63 %
Platelets: 162 10*3/uL (ref 150–400)
RBC: 3.1 MIL/uL — ABNORMAL LOW (ref 4.22–5.81)
RDW: 20.3 % — ABNORMAL HIGH (ref 11.5–15.5)
WBC: 3.7 10*3/uL — ABNORMAL LOW (ref 4.0–10.5)
nRBC: 0 % (ref 0.0–0.2)

## 2018-12-09 LAB — LIPASE, BLOOD: Lipase: 61 U/L — ABNORMAL HIGH (ref 11–51)

## 2018-12-09 MED ORDER — ONDANSETRON HCL 4 MG/2ML IJ SOLN: 4.0000 mg | Freq: Once | INTRAMUSCULAR | Status: DC

## 2018-12-09 MED ORDER — OXYCODONE HCL 5 MG PO TABS
10.0000 mg | ORAL_TABLET | Freq: Once | ORAL | Status: AC
  Administered 2018-12-09: 03:00:00 10 mg via ORAL
  Filled 2018-12-09: qty 2

## 2018-12-09 MED ORDER — PROMETHAZINE HCL 25 MG/ML IJ SOLN
25.0000 mg | Freq: Once | INTRAMUSCULAR | Status: AC
  Administered 2018-12-09: 25 mg via INTRAMUSCULAR
  Filled 2018-12-09: qty 1

## 2018-12-09 MED ORDER — SODIUM CHLORIDE 0.9 % IV BOLUS: 1000.0000 mL | Freq: Once | INTRAVENOUS | Status: DC

## 2018-12-09 MED ORDER — PROMETHAZINE HCL 25 MG PO TABS: 25.0000 mg | ORAL_TABLET | Freq: Four times a day (QID) | ORAL | 0 refills | Status: DC | PRN

## 2018-12-09 MED ORDER — PROMETHAZINE HCL 25 MG RE SUPP: 25.0000 mg | Freq: Four times a day (QID) | RECTAL | 0 refills | Status: DC | PRN

## 2018-12-09 NOTE — ED Notes (Addendum)
Requesting more pain medicine, Dr. [name] made aware. Sitting on edge of bed, no longer wearing oxygen.

## 2018-12-09 NOTE — ED Notes (Signed)
MD aware of lab hemolyzed. No new orders
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
## 2018-12-09 NOTE — ED Notes (Signed)
Pt demanding to have oxygen put on. States he uses it at home. No sob noted. o2 sat 98% on r/a. Pt yelling at this RN to put oxygen on him.

## 2018-12-09 NOTE — ED Notes (Signed)
Patient transported to X-ray 

## 2018-12-09 NOTE — ED Triage Notes (Signed)
EMS transport from [facility]. Pt states fell out of bed and back pain. Ambulatory. Also c/o abd pain and vomiting past 2 days. Dressing Right arm from MRSA wound.

## 2018-12-09 NOTE — ED Triage Notes (Signed)
Pt seen in ED on 5/17 for arm pain/wound

## 2018-12-09 NOTE — Discharge Instructions (Signed)
Apply ice to the sore areas - thirty minutes at a time, up to four times a day.  Continue taking your medication for chronic pain. Any adjustments in doses will have to come from your pain management team.  Make sure you get to your dialysis today.

## 2018-12-09 NOTE — ED Notes (Signed)
ED Provider at bedside.

## 2018-12-12 DIAGNOSIS — Z992 Dependence on renal dialysis: Secondary | ICD-10-CM | POA: Diagnosis not present

## 2018-12-12 DIAGNOSIS — N2581 Secondary hyperparathyroidism of renal origin: Secondary | ICD-10-CM | POA: Diagnosis not present

## 2018-12-12 DIAGNOSIS — N186 End stage renal disease: Secondary | ICD-10-CM | POA: Diagnosis not present

## 2018-12-12 DIAGNOSIS — D631 Anemia in chronic kidney disease: Secondary | ICD-10-CM | POA: Diagnosis not present

## 2018-12-12 DIAGNOSIS — D509 Iron deficiency anemia, unspecified: Secondary | ICD-10-CM | POA: Diagnosis not present

## 2018-12-12 DIAGNOSIS — E1122 Type 2 diabetes mellitus with diabetic chronic kidney disease: Secondary | ICD-10-CM | POA: Diagnosis not present

## 2018-12-14 DIAGNOSIS — D509 Iron deficiency anemia, unspecified: Secondary | ICD-10-CM | POA: Diagnosis not present

## 2018-12-14 DIAGNOSIS — Z992 Dependence on renal dialysis: Secondary | ICD-10-CM | POA: Diagnosis not present

## 2018-12-14 DIAGNOSIS — E1122 Type 2 diabetes mellitus with diabetic chronic kidney disease: Secondary | ICD-10-CM | POA: Diagnosis not present

## 2018-12-14 DIAGNOSIS — N186 End stage renal disease: Secondary | ICD-10-CM | POA: Diagnosis not present

## 2018-12-14 DIAGNOSIS — D631 Anemia in chronic kidney disease: Secondary | ICD-10-CM | POA: Diagnosis not present

## 2018-12-14 DIAGNOSIS — N2581 Secondary hyperparathyroidism of renal origin: Secondary | ICD-10-CM | POA: Diagnosis not present

## 2018-12-17 DIAGNOSIS — N2581 Secondary hyperparathyroidism of renal origin: Secondary | ICD-10-CM | POA: Diagnosis not present

## 2018-12-17 DIAGNOSIS — Z992 Dependence on renal dialysis: Secondary | ICD-10-CM | POA: Diagnosis not present

## 2018-12-17 DIAGNOSIS — D631 Anemia in chronic kidney disease: Secondary | ICD-10-CM | POA: Diagnosis not present

## 2018-12-17 DIAGNOSIS — N186 End stage renal disease: Secondary | ICD-10-CM | POA: Diagnosis not present

## 2018-12-17 DIAGNOSIS — E1122 Type 2 diabetes mellitus with diabetic chronic kidney disease: Secondary | ICD-10-CM | POA: Diagnosis not present

## 2018-12-17 DIAGNOSIS — Z86718 Personal history of other venous thrombosis and embolism: Secondary | ICD-10-CM | POA: Diagnosis not present

## 2018-12-17 DIAGNOSIS — D509 Iron deficiency anemia, unspecified: Secondary | ICD-10-CM | POA: Diagnosis not present

## 2018-12-18 IMAGING — CT CT ABD-PELV W/ CM
2 of 5 series · 15 of 46 positions shown, 17 images · IV contrast (Isovue)
Comparison: 01/01/2016

CLINICAL DATA: Upper abdominal pain. Dialysis patient with failed
renal transplant from 0661. History of diabetes.

EXAM:
CT ABDOMEN AND PELVIS WITH CONTRAST
TECHNIQUE: Multidetector CT imaging of the abdomen and pelvis was performed
using the standard protocol following bolus administration of
intravenous contrast.
CONTRAST:  15mL 2D2WNX-3LL IOPAMIDOL (2D2WNX-3LL) INJECTION 61%,
100mL 2D2WNX-3LL IOPAMIDOL (2D2WNX-3LL) INJECTION 61%

[Series 2: axial st · axial · 0.76mm/px · z∈[-689,-289]mm · 12 of 90 slices shown, 14 images]
[im 5/90  soft-tissue]
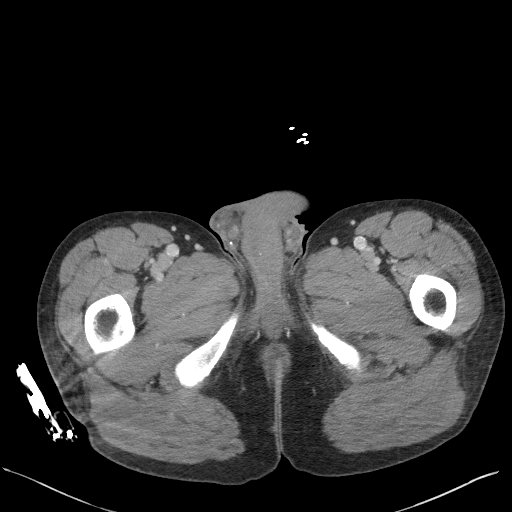
[im 5/90  bone]
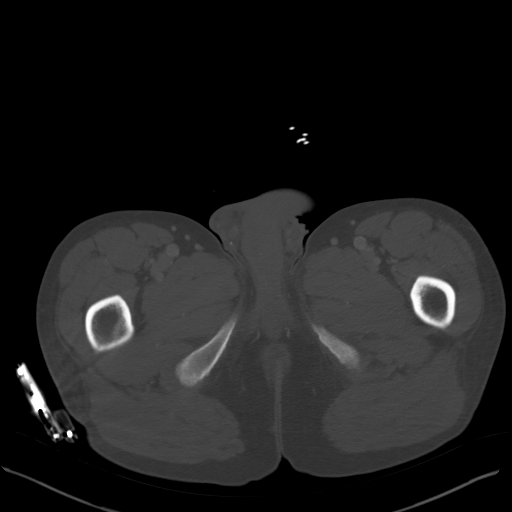
[im 13/90  soft-tissue]
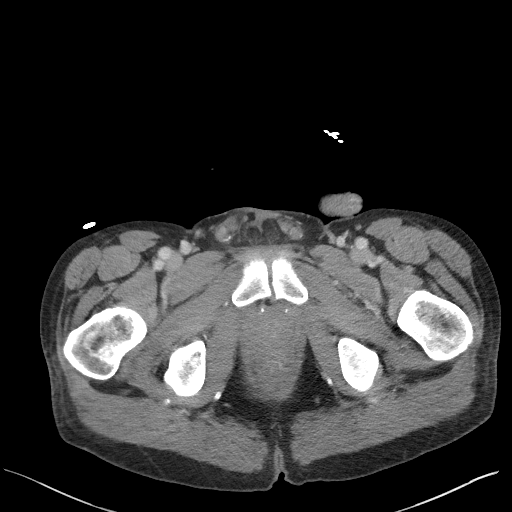
[im 22/90  soft-tissue]
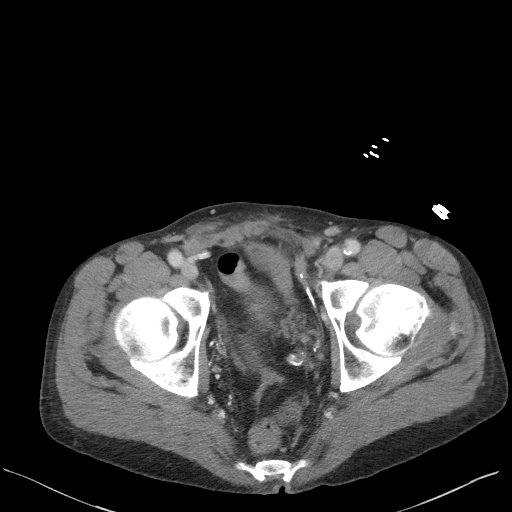
[im 26/90  soft-tissue]
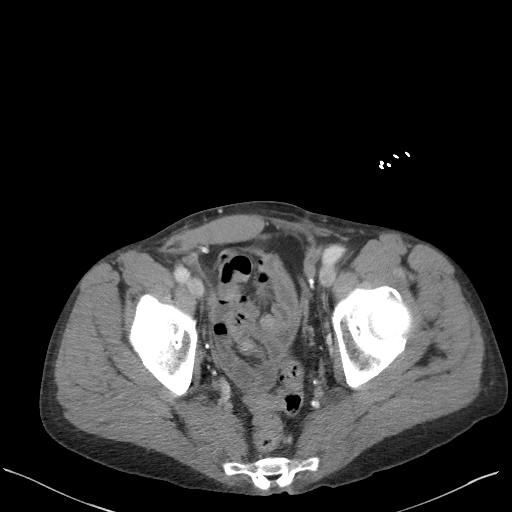
[im 34/90  soft-tissue]
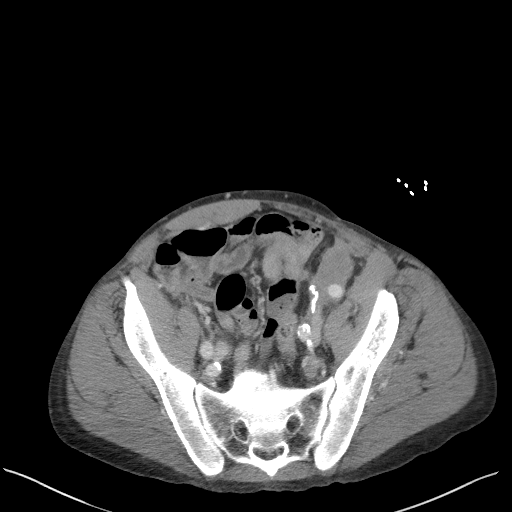
[im 43/90  soft-tissue]
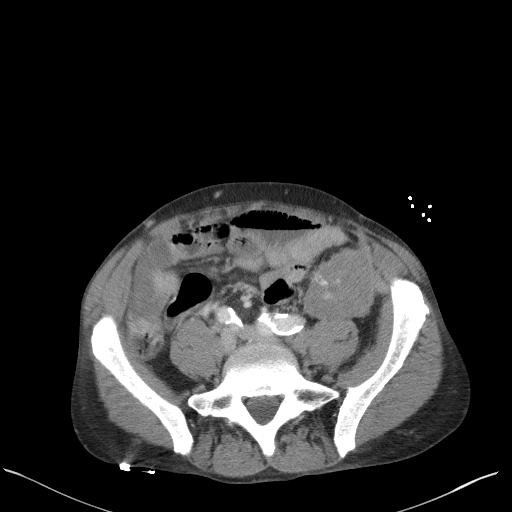
[im 47/90  soft-tissue]
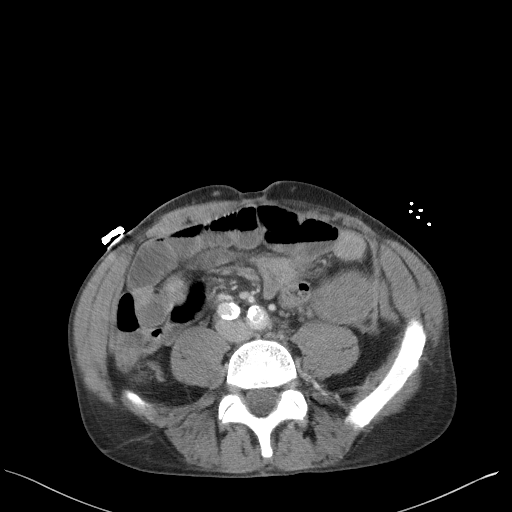
[im 56/90  soft-tissue]
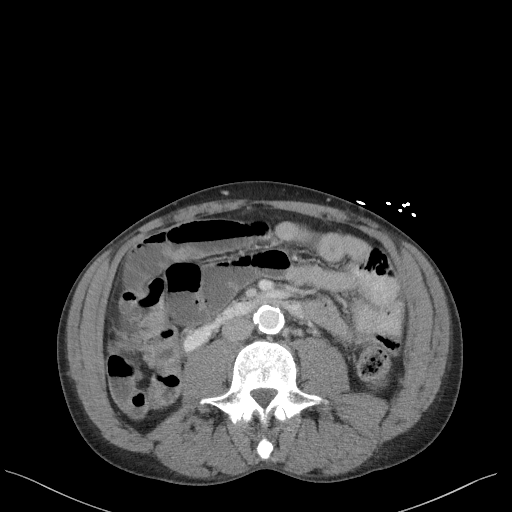
[im 64/90  soft-tissue]
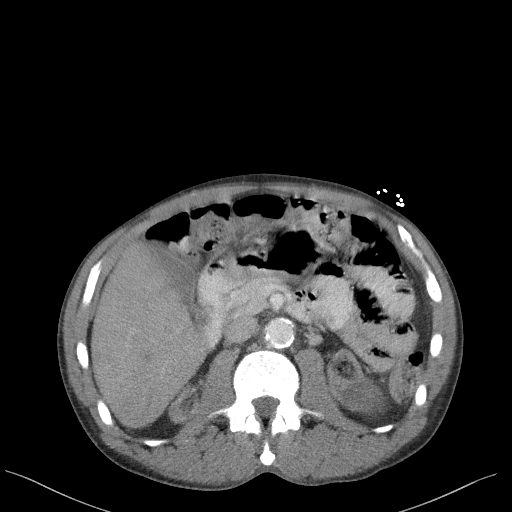
[im 64/90  bone]
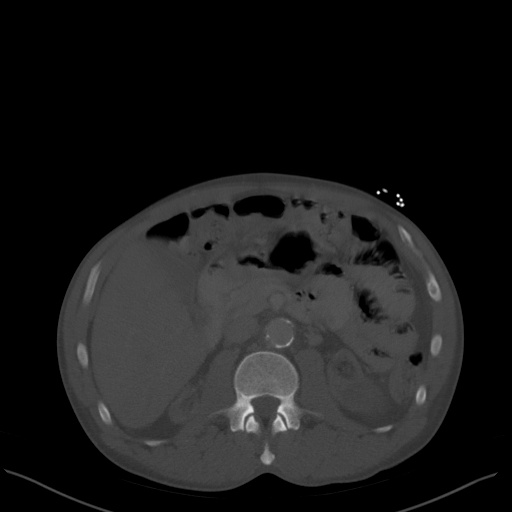
[im 68/90  soft-tissue]
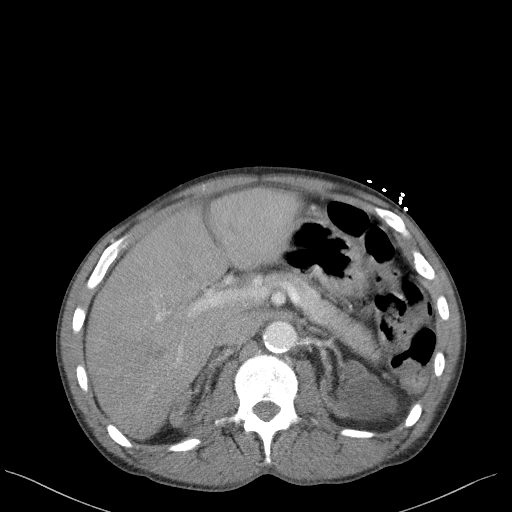
[im 77/90  soft-tissue]
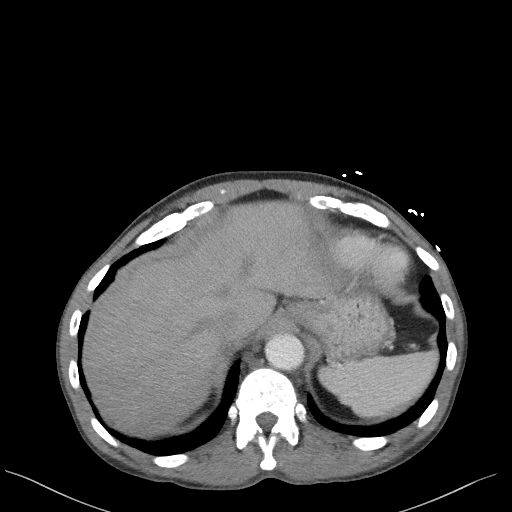
[im 85/90  soft-tissue]
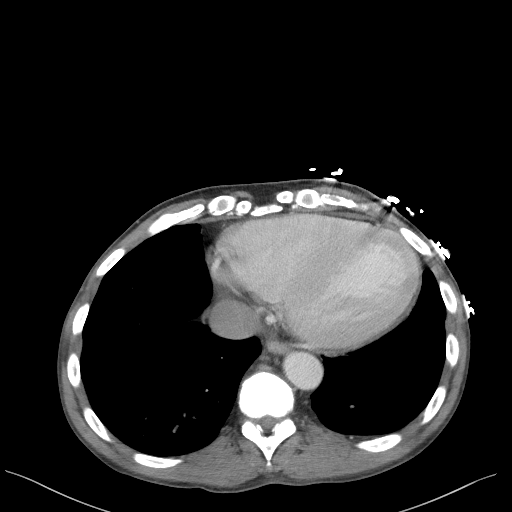

[Series 4: coronal st · coronal · 0.62mm/px · 3 of 101 slices shown]
[im 34/101  soft-tissue]
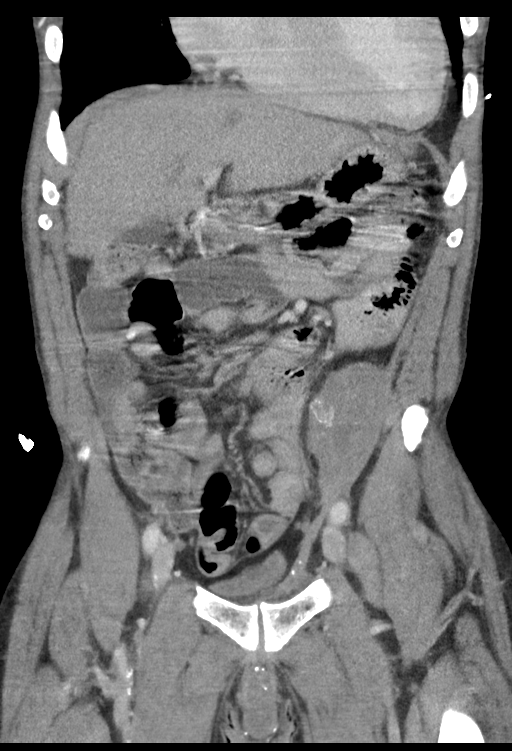
[im 45/101  soft-tissue]
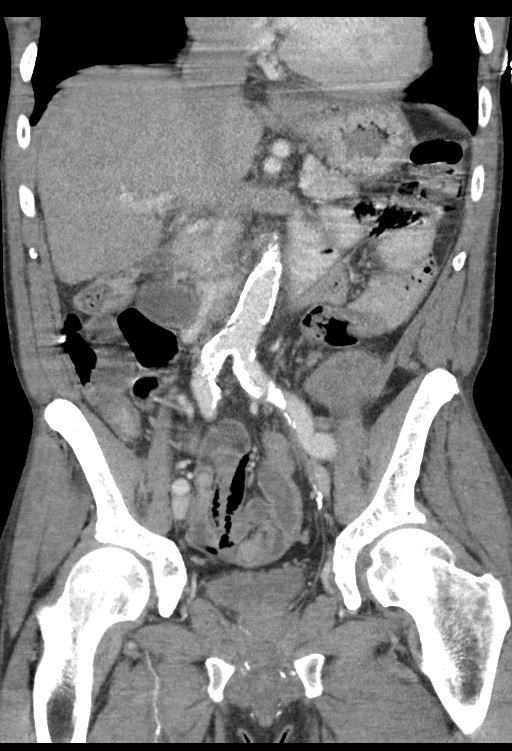
[im 56/101  soft-tissue]
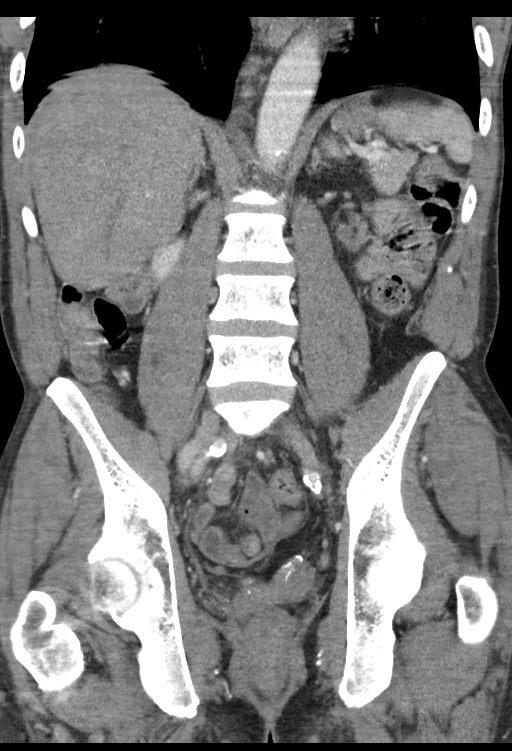

[15 of 46 positions shown; findings below may reference images not displayed]

FINDINGS: Lower chest: No acute abnormality.

Hepatobiliary: Mild diffuse fatty infiltration of the liver. No
focal lesions. Gallbladder and bile ducts are unremarkable.

Pancreas: Unremarkable. No pancreatic ductal dilatation or
surrounding inflammatory changes.

Spleen: Normal in size without focal abnormality.

Adrenals/Urinary Tract: No adrenal gland nodules. Kidneys are
atrophic bilaterally. No hydronephrosis. Low-attenuation mass in the
left kidney measuring 4.1 cm diameter, unchanged since prior study,
measures low-density but contains internal architectural change.
Solid mass is not excluded. Delayed renal nephrograms. No contrast
material demonstrated renal collecting systems. Bladder is
decompressed. Left pelvic transplant kidney demonstrating prominent
vascular calcification. No contrast uptake is identified.

Stomach/Bowel: Motion artifact limits evaluation. No evidence of
significant bowel distention or wall thickening. Colon is
decompressed. Appendix is not identified.

Vascular/Lymphatic: Diffuse calcification of abdominal aorta and
branch vessels. No aneurysm. Inferior vena cava is unremarkable. No
lymphadenopathy.

Reproductive: Prostate is unremarkable.

Other: No abdominal wall hernia or abnormality. No abdominopelvic
ascites.

Musculoskeletal: No acute or significant osseous findings.
IMPRESSION: No acute process demonstrated in the abdomen or pelvis. Mild fatty
infiltration of the liver. Atrophic native kidneys with
low-attenuation indeterminate mass lesion in the left kidney,
unchanged. Transplanted left pelvic kidney with vascular
calcification. No renal function is identified. No evidence of bowel
obstruction or inflammation. Aortic atherosclerosis.

## 2018-12-19 DIAGNOSIS — D509 Iron deficiency anemia, unspecified: Secondary | ICD-10-CM | POA: Diagnosis not present

## 2018-12-19 DIAGNOSIS — Z992 Dependence on renal dialysis: Secondary | ICD-10-CM | POA: Diagnosis not present

## 2018-12-19 DIAGNOSIS — E1122 Type 2 diabetes mellitus with diabetic chronic kidney disease: Secondary | ICD-10-CM | POA: Diagnosis not present

## 2018-12-19 DIAGNOSIS — D631 Anemia in chronic kidney disease: Secondary | ICD-10-CM | POA: Diagnosis not present

## 2018-12-19 DIAGNOSIS — N186 End stage renal disease: Secondary | ICD-10-CM | POA: Diagnosis not present

## 2018-12-19 DIAGNOSIS — N2581 Secondary hyperparathyroidism of renal origin: Secondary | ICD-10-CM | POA: Diagnosis not present

## 2018-12-21 DIAGNOSIS — N186 End stage renal disease: Secondary | ICD-10-CM | POA: Diagnosis not present

## 2018-12-21 DIAGNOSIS — T8610 Unspecified complication of kidney transplant: Secondary | ICD-10-CM | POA: Diagnosis not present

## 2018-12-21 DIAGNOSIS — Z992 Dependence on renal dialysis: Secondary | ICD-10-CM | POA: Diagnosis not present

## 2018-12-22 DIAGNOSIS — N186 End stage renal disease: Secondary | ICD-10-CM | POA: Diagnosis not present

## 2018-12-22 DIAGNOSIS — E875 Hyperkalemia: Secondary | ICD-10-CM | POA: Diagnosis not present

## 2018-12-22 DIAGNOSIS — D631 Anemia in chronic kidney disease: Secondary | ICD-10-CM | POA: Diagnosis not present

## 2018-12-22 DIAGNOSIS — D509 Iron deficiency anemia, unspecified: Secondary | ICD-10-CM | POA: Diagnosis not present

## 2018-12-22 DIAGNOSIS — N2581 Secondary hyperparathyroidism of renal origin: Secondary | ICD-10-CM | POA: Diagnosis not present

## 2018-12-24 DIAGNOSIS — N186 End stage renal disease: Secondary | ICD-10-CM | POA: Diagnosis not present

## 2018-12-24 DIAGNOSIS — E875 Hyperkalemia: Secondary | ICD-10-CM | POA: Diagnosis not present

## 2018-12-24 DIAGNOSIS — N2581 Secondary hyperparathyroidism of renal origin: Secondary | ICD-10-CM | POA: Diagnosis not present

## 2018-12-24 DIAGNOSIS — D631 Anemia in chronic kidney disease: Secondary | ICD-10-CM | POA: Diagnosis not present

## 2018-12-24 DIAGNOSIS — D509 Iron deficiency anemia, unspecified: Secondary | ICD-10-CM | POA: Diagnosis not present

## 2018-12-24 IMAGING — CT CT ANGIO CHEST-ABD-PELV FOR DISSECTION W/ AND WO/W CM
1 of 10 series · 1 of 37 positions shown, 3 images · IV contrast (isovue)
Comparison: None.

CLINICAL DATA: Back pain and concern for aortic dissection

EXAM:
CT ANGIOGRAPHY CHEST, ABDOMEN AND PELVIS
TECHNIQUE: Multidetector CT imaging through the chest, abdomen and pelvis was
performed using the standard protocol during bolus administration of
intravenous contrast. Multiplanar reconstructed images and MIPs were
obtained and reviewed to evaluate the vascular anatomy.
CONTRAST:  100 mL Isovue 370 IV

[Series 300: locator · axial · 0.68mm/px · z∈[-447,-447]mm · 1 of 1 slices shown, 3 images]
[im 1/1  mediastinal]
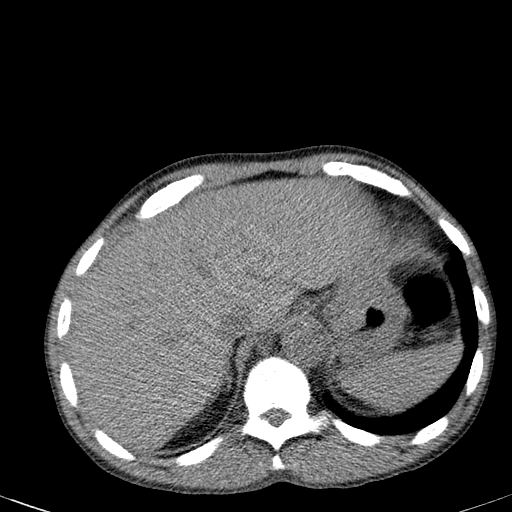
[im 1/1  lung]
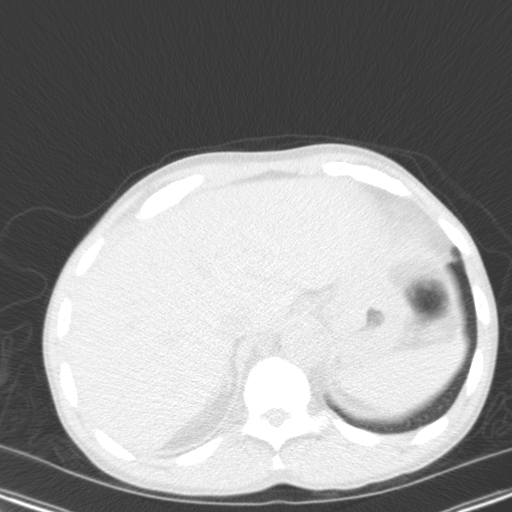
[im 1/1  bone]
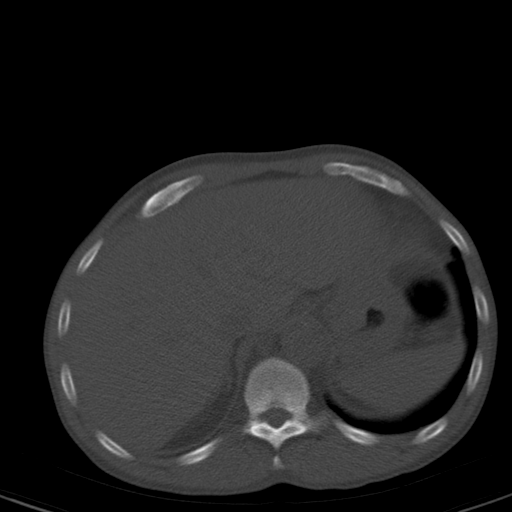

[1 of 37 positions shown; findings below may reference images not displayed]

FINDINGS: CTA CHEST FINDINGS

Cardiovascular: The heart is enlarged. There is a small pericardial
effusion measuring 6 mm.

There is a normal variant aortic arch branching pattern with the
brachiocephalic and left common carotid arteries sharing a common
origin. Precontrast imaging shows no intramural hematoma of the
thoracic aorta. There is aortic atherosclerosis. There is a small
there is a small outpouching from the inferior aspect of the aortic
arch just distal to the origin of the left subclavian artery. There
is a linear opacity within the proximal brachiocephalic artery
(coronal image 50).

Mediastinum/Nodes: No mediastinal, hilar or axillary
lymphadenopathy. The visualized thyroid and thoracic esophageal
course are unremarkable.

Lungs/Pleura: Lungs are clear. No pleural effusion or pneumothorax.

Musculoskeletal: No chest wall abnormality. No acute or significant
osseous findings.

Review of the MIP images confirms the above findings.

CTA ABDOMEN AND PELVIS FINDINGS

VASCULAR

Aorta: Normal caliber aorta without aneurysm, dissection, vasculitis
or significant stenosis.

Celiac: Patent without evidence of aneurysm, dissection, vasculitis
or significant stenosis.

SMA: Patent without evidence of aneurysm, dissection, vasculitis or
significant stenosis.

Renals: There is atherosclerotic narrowing at the origins of both
renal arteries.

IMA: Patent without evidence of aneurysm, dissection, vasculitis or
significant stenosis.

Inflow: There is a small focal dissection of the left common iliac
artery. There is moderate to severe stenosis at the origin of the
left internal iliac artery. There is extensive atherosclerotic
calcification of the left internal iliac artery. There is no
aneurysm or severe stenosis of the right iliac arteries.

Review of the MIP images confirms the above findings.

NON-VASCULAR

Hepatobiliary: No focal liver abnormality is seen. No gallstones,
gallbladder wall thickening, or biliary dilatation.

Pancreas: Unremarkable. No pancreatic ductal dilatation or
surrounding inflammatory changes.

Spleen: Normal in size without focal abnormality.

Adrenals/Urinary Tract: The adrenal glands are normal. Both kidneys
are severely atrophic with multiple renal cysts. The largest cyst
measures 4.2 cm.

Stomach/Bowel: No dilated small bowel or other evidence of
obstruction. No enteric or colonic inflammation.

Lymphatic: No abdominal or pelvic lymphadenopathy.

Reproductive: Unremarkable prostate and seminal vesicles.

Other: Soft tissue mass in the left iliac fossa is suspected to be a
failed renal transplant.

Musculoskeletal: There is no bony spinal canal stenosis. No lytic or
blastic lesions.

Review of the MIP images confirms the above findings.
IMPRESSION: 1. No aortic dissection.
2. Linear defect along wall of the proximal brachiocephalic artery
as it arises from the aortic arch. While this may be secondary to
motion, no definite motion could be identified in adjacent
structures. This could indicate a short segment dissection. A
cardiac gated study might be helpful to determine if this is a true
finding or motion artifact.
3. Focal outpouching from the undersurface of the aortic arch is in
the typical location of the ductus diverticulum, which is the
favored diagnosis. A small pseudo aneurysm or penetrating ulcer is
considered less likely.
4. Small focal dissection within the proximal left common iliac
artery.
5. Extensive aortic and iliac atherosclerosis. Severe narrowing of
the left internal iliac artery secondary to calcified and
noncalcified plaque.
6. Soft tissue mass in the left iliac fossa is likely a failed renal
transplant.

## 2018-12-25 IMAGING — CR DG CHEST 2V
2 series · 2 of 2 positions shown · non-contrast
Comparison: CTA chest abdomen and pelvis 08/04/2016 and earlier.

CLINICAL DATA: 50-year-old male with end-stage renal disease.
Initial encounter.

EXAM:
CHEST  2 VIEW

[chest lat]
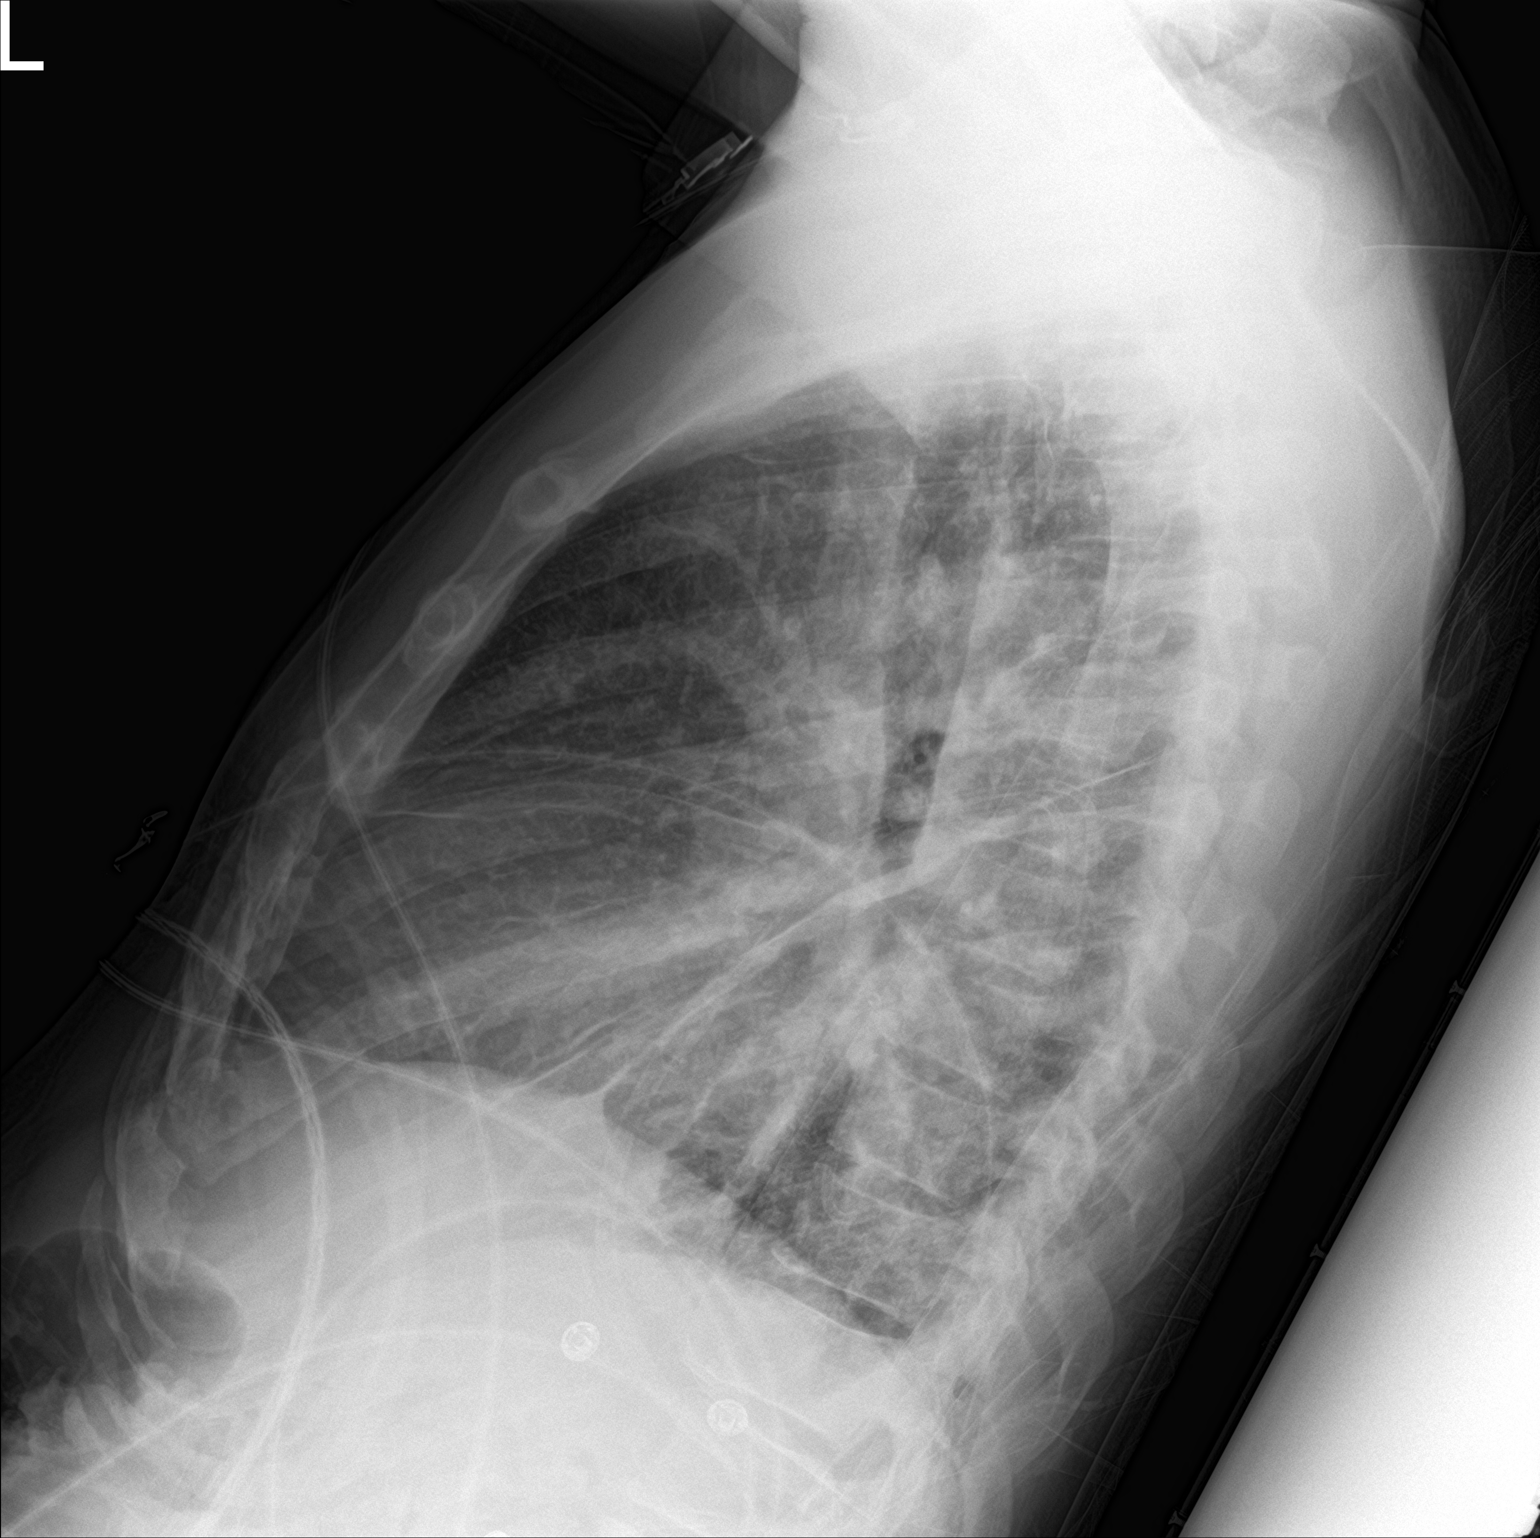

[chest ap]
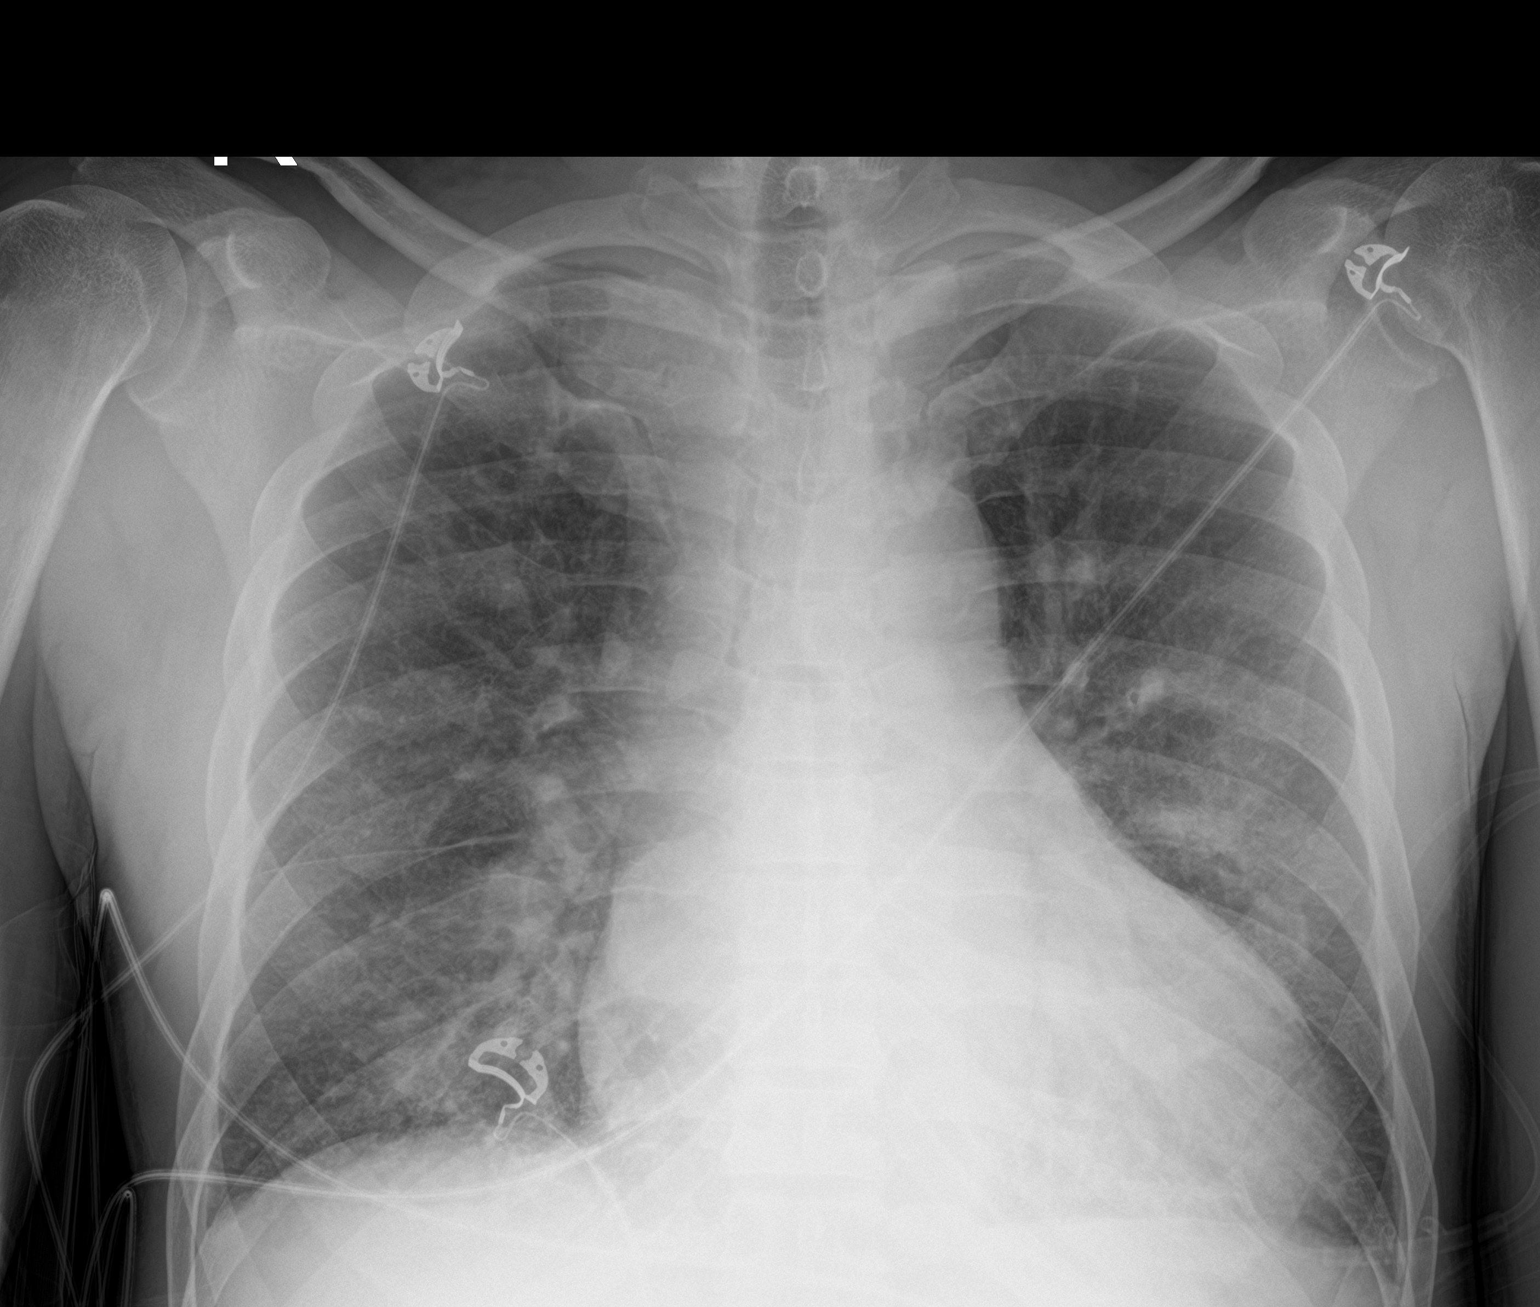

[2 of 2 positions shown; findings below may reference images not displayed]

FINDINGS: Stable cardiomegaly and mediastinal contours. Increased pulmonary
interstitial opacity and indistinctness of pulmonary vasculature
compared to 07/14/2016. Small volume of pleural fluid along the
major fissures now. No pneumothorax. No consolidation or confluent
pulmonary opacity. Visualized tracheal air column is within normal
limits. No acute osseous abnormality identified.
IMPRESSION: 1. Appearance suggestive of mild pulmonary interstitial edema with
small volume pleural effusion within the fissures.
2. Otherwise stable chest with chronic cardiomegaly.

## 2018-12-26 DIAGNOSIS — E875 Hyperkalemia: Secondary | ICD-10-CM | POA: Diagnosis not present

## 2018-12-26 DIAGNOSIS — N186 End stage renal disease: Secondary | ICD-10-CM | POA: Diagnosis not present

## 2018-12-26 DIAGNOSIS — D631 Anemia in chronic kidney disease: Secondary | ICD-10-CM | POA: Diagnosis not present

## 2018-12-26 DIAGNOSIS — N2581 Secondary hyperparathyroidism of renal origin: Secondary | ICD-10-CM | POA: Diagnosis not present

## 2018-12-26 DIAGNOSIS — D509 Iron deficiency anemia, unspecified: Secondary | ICD-10-CM | POA: Diagnosis not present

## 2018-12-29 DIAGNOSIS — D509 Iron deficiency anemia, unspecified: Secondary | ICD-10-CM | POA: Diagnosis not present

## 2018-12-29 DIAGNOSIS — N2581 Secondary hyperparathyroidism of renal origin: Secondary | ICD-10-CM | POA: Diagnosis not present

## 2018-12-29 DIAGNOSIS — E875 Hyperkalemia: Secondary | ICD-10-CM | POA: Diagnosis not present

## 2018-12-29 DIAGNOSIS — D631 Anemia in chronic kidney disease: Secondary | ICD-10-CM | POA: Diagnosis not present

## 2018-12-29 DIAGNOSIS — N186 End stage renal disease: Secondary | ICD-10-CM | POA: Diagnosis not present

## 2018-12-31 DIAGNOSIS — E875 Hyperkalemia: Secondary | ICD-10-CM | POA: Diagnosis not present

## 2018-12-31 DIAGNOSIS — N2581 Secondary hyperparathyroidism of renal origin: Secondary | ICD-10-CM | POA: Diagnosis not present

## 2018-12-31 DIAGNOSIS — D509 Iron deficiency anemia, unspecified: Secondary | ICD-10-CM | POA: Diagnosis not present

## 2018-12-31 DIAGNOSIS — D631 Anemia in chronic kidney disease: Secondary | ICD-10-CM | POA: Diagnosis not present

## 2018-12-31 DIAGNOSIS — N186 End stage renal disease: Secondary | ICD-10-CM | POA: Diagnosis not present

## 2019-01-05 DIAGNOSIS — Z79899 Other long term (current) drug therapy: Secondary | ICD-10-CM | POA: Diagnosis not present

## 2019-01-05 DIAGNOSIS — M545 Low back pain: Secondary | ICD-10-CM | POA: Diagnosis not present

## 2019-01-05 DIAGNOSIS — G8929 Other chronic pain: Secondary | ICD-10-CM | POA: Diagnosis not present

## 2019-01-05 DIAGNOSIS — I10 Essential (primary) hypertension: Secondary | ICD-10-CM | POA: Diagnosis not present

## 2019-01-05 DIAGNOSIS — Z79891 Long term (current) use of opiate analgesic: Secondary | ICD-10-CM | POA: Diagnosis not present

## 2019-01-06 ENCOUNTER — Other Ambulatory Visit: Payer: Self-pay | Admitting: Nurse Practitioner

## 2019-01-06 ENCOUNTER — Other Ambulatory Visit (HOSPITAL_COMMUNITY): Payer: Self-pay | Admitting: Nurse Practitioner

## 2019-01-06 DIAGNOSIS — G8929 Other chronic pain: Secondary | ICD-10-CM

## 2019-01-07 DIAGNOSIS — N186 End stage renal disease: Secondary | ICD-10-CM | POA: Diagnosis not present

## 2019-01-07 DIAGNOSIS — D509 Iron deficiency anemia, unspecified: Secondary | ICD-10-CM | POA: Diagnosis not present

## 2019-01-07 DIAGNOSIS — E875 Hyperkalemia: Secondary | ICD-10-CM | POA: Diagnosis not present

## 2019-01-07 DIAGNOSIS — N2581 Secondary hyperparathyroidism of renal origin: Secondary | ICD-10-CM | POA: Diagnosis not present

## 2019-01-07 DIAGNOSIS — D631 Anemia in chronic kidney disease: Secondary | ICD-10-CM | POA: Diagnosis not present

## 2019-01-08 ENCOUNTER — Ambulatory Visit (HOSPITAL_COMMUNITY): Admission: RE | Admit: 2019-01-08 | Payer: Medicare Other | Source: Ambulatory Visit

## 2019-01-08 ENCOUNTER — Encounter (HOSPITAL_COMMUNITY): Payer: Self-pay

## 2019-01-09 DIAGNOSIS — D631 Anemia in chronic kidney disease: Secondary | ICD-10-CM | POA: Diagnosis not present

## 2019-01-09 DIAGNOSIS — D509 Iron deficiency anemia, unspecified: Secondary | ICD-10-CM | POA: Diagnosis not present

## 2019-01-09 DIAGNOSIS — N2581 Secondary hyperparathyroidism of renal origin: Secondary | ICD-10-CM | POA: Diagnosis not present

## 2019-01-09 DIAGNOSIS — E875 Hyperkalemia: Secondary | ICD-10-CM | POA: Diagnosis not present

## 2019-01-09 DIAGNOSIS — N186 End stage renal disease: Secondary | ICD-10-CM | POA: Diagnosis not present

## 2019-01-12 DIAGNOSIS — D509 Iron deficiency anemia, unspecified: Secondary | ICD-10-CM | POA: Diagnosis not present

## 2019-01-12 DIAGNOSIS — D631 Anemia in chronic kidney disease: Secondary | ICD-10-CM | POA: Diagnosis not present

## 2019-01-12 DIAGNOSIS — E875 Hyperkalemia: Secondary | ICD-10-CM | POA: Diagnosis not present

## 2019-01-12 DIAGNOSIS — N186 End stage renal disease: Secondary | ICD-10-CM | POA: Diagnosis not present

## 2019-01-12 DIAGNOSIS — N2581 Secondary hyperparathyroidism of renal origin: Secondary | ICD-10-CM | POA: Diagnosis not present

## 2019-01-19 DIAGNOSIS — N2581 Secondary hyperparathyroidism of renal origin: Secondary | ICD-10-CM | POA: Diagnosis not present

## 2019-01-19 DIAGNOSIS — E875 Hyperkalemia: Secondary | ICD-10-CM | POA: Diagnosis not present

## 2019-01-19 DIAGNOSIS — Z79899 Other long term (current) drug therapy: Secondary | ICD-10-CM | POA: Diagnosis not present

## 2019-01-19 DIAGNOSIS — N186 End stage renal disease: Secondary | ICD-10-CM | POA: Diagnosis not present

## 2019-01-19 DIAGNOSIS — D509 Iron deficiency anemia, unspecified: Secondary | ICD-10-CM | POA: Diagnosis not present

## 2019-01-19 DIAGNOSIS — Z5181 Encounter for therapeutic drug level monitoring: Secondary | ICD-10-CM | POA: Diagnosis not present

## 2019-01-19 DIAGNOSIS — D631 Anemia in chronic kidney disease: Secondary | ICD-10-CM | POA: Diagnosis not present

## 2019-01-20 DIAGNOSIS — T8610 Unspecified complication of kidney transplant: Secondary | ICD-10-CM | POA: Diagnosis not present

## 2019-01-20 DIAGNOSIS — N186 End stage renal disease: Secondary | ICD-10-CM | POA: Diagnosis not present

## 2019-01-20 DIAGNOSIS — Z992 Dependence on renal dialysis: Secondary | ICD-10-CM | POA: Diagnosis not present

## 2019-01-23 DIAGNOSIS — N2581 Secondary hyperparathyroidism of renal origin: Secondary | ICD-10-CM | POA: Diagnosis not present

## 2019-01-23 DIAGNOSIS — D631 Anemia in chronic kidney disease: Secondary | ICD-10-CM | POA: Diagnosis not present

## 2019-01-23 DIAGNOSIS — E875 Hyperkalemia: Secondary | ICD-10-CM | POA: Diagnosis not present

## 2019-01-23 DIAGNOSIS — D509 Iron deficiency anemia, unspecified: Secondary | ICD-10-CM | POA: Diagnosis not present

## 2019-01-23 DIAGNOSIS — Z992 Dependence on renal dialysis: Secondary | ICD-10-CM | POA: Diagnosis not present

## 2019-01-23 DIAGNOSIS — N186 End stage renal disease: Secondary | ICD-10-CM | POA: Diagnosis not present

## 2019-01-26 DIAGNOSIS — N2581 Secondary hyperparathyroidism of renal origin: Secondary | ICD-10-CM | POA: Diagnosis not present

## 2019-01-26 DIAGNOSIS — N186 End stage renal disease: Secondary | ICD-10-CM | POA: Diagnosis not present

## 2019-01-26 DIAGNOSIS — D509 Iron deficiency anemia, unspecified: Secondary | ICD-10-CM | POA: Diagnosis not present

## 2019-01-26 DIAGNOSIS — E875 Hyperkalemia: Secondary | ICD-10-CM | POA: Diagnosis not present

## 2019-01-26 DIAGNOSIS — D631 Anemia in chronic kidney disease: Secondary | ICD-10-CM | POA: Diagnosis not present

## 2019-01-26 DIAGNOSIS — Z992 Dependence on renal dialysis: Secondary | ICD-10-CM | POA: Diagnosis not present

## 2019-01-28 DIAGNOSIS — N2581 Secondary hyperparathyroidism of renal origin: Secondary | ICD-10-CM | POA: Diagnosis not present

## 2019-01-28 DIAGNOSIS — D631 Anemia in chronic kidney disease: Secondary | ICD-10-CM | POA: Diagnosis not present

## 2019-01-28 DIAGNOSIS — E875 Hyperkalemia: Secondary | ICD-10-CM | POA: Diagnosis not present

## 2019-01-28 DIAGNOSIS — Z992 Dependence on renal dialysis: Secondary | ICD-10-CM | POA: Diagnosis not present

## 2019-01-28 DIAGNOSIS — N186 End stage renal disease: Secondary | ICD-10-CM | POA: Diagnosis not present

## 2019-01-28 DIAGNOSIS — D509 Iron deficiency anemia, unspecified: Secondary | ICD-10-CM | POA: Diagnosis not present

## 2019-01-29 DIAGNOSIS — E875 Hyperkalemia: Secondary | ICD-10-CM | POA: Diagnosis not present

## 2019-01-29 DIAGNOSIS — D631 Anemia in chronic kidney disease: Secondary | ICD-10-CM | POA: Diagnosis not present

## 2019-01-29 DIAGNOSIS — D509 Iron deficiency anemia, unspecified: Secondary | ICD-10-CM | POA: Diagnosis not present

## 2019-01-29 DIAGNOSIS — N186 End stage renal disease: Secondary | ICD-10-CM | POA: Diagnosis not present

## 2019-01-29 DIAGNOSIS — N2581 Secondary hyperparathyroidism of renal origin: Secondary | ICD-10-CM | POA: Diagnosis not present

## 2019-01-29 DIAGNOSIS — Z992 Dependence on renal dialysis: Secondary | ICD-10-CM | POA: Diagnosis not present

## 2019-02-04 DIAGNOSIS — N2581 Secondary hyperparathyroidism of renal origin: Secondary | ICD-10-CM | POA: Diagnosis not present

## 2019-02-04 DIAGNOSIS — E875 Hyperkalemia: Secondary | ICD-10-CM | POA: Diagnosis not present

## 2019-02-04 DIAGNOSIS — D509 Iron deficiency anemia, unspecified: Secondary | ICD-10-CM | POA: Diagnosis not present

## 2019-02-04 DIAGNOSIS — Z992 Dependence on renal dialysis: Secondary | ICD-10-CM | POA: Diagnosis not present

## 2019-02-04 DIAGNOSIS — D631 Anemia in chronic kidney disease: Secondary | ICD-10-CM | POA: Diagnosis not present

## 2019-02-04 DIAGNOSIS — G8929 Other chronic pain: Secondary | ICD-10-CM | POA: Diagnosis not present

## 2019-02-04 DIAGNOSIS — Z79891 Long term (current) use of opiate analgesic: Secondary | ICD-10-CM | POA: Diagnosis not present

## 2019-02-04 DIAGNOSIS — N186 End stage renal disease: Secondary | ICD-10-CM | POA: Diagnosis not present

## 2019-02-04 DIAGNOSIS — M545 Low back pain: Secondary | ICD-10-CM | POA: Diagnosis not present

## 2019-02-06 DIAGNOSIS — E875 Hyperkalemia: Secondary | ICD-10-CM | POA: Diagnosis not present

## 2019-02-06 DIAGNOSIS — N186 End stage renal disease: Secondary | ICD-10-CM | POA: Diagnosis not present

## 2019-02-06 DIAGNOSIS — N2581 Secondary hyperparathyroidism of renal origin: Secondary | ICD-10-CM | POA: Diagnosis not present

## 2019-02-06 DIAGNOSIS — D631 Anemia in chronic kidney disease: Secondary | ICD-10-CM | POA: Diagnosis not present

## 2019-02-06 DIAGNOSIS — D509 Iron deficiency anemia, unspecified: Secondary | ICD-10-CM | POA: Diagnosis not present

## 2019-02-06 DIAGNOSIS — Z992 Dependence on renal dialysis: Secondary | ICD-10-CM | POA: Diagnosis not present

## 2019-02-11 DIAGNOSIS — Z86718 Personal history of other venous thrombosis and embolism: Secondary | ICD-10-CM | POA: Diagnosis not present

## 2019-02-11 DIAGNOSIS — Z992 Dependence on renal dialysis: Secondary | ICD-10-CM | POA: Diagnosis not present

## 2019-02-11 DIAGNOSIS — G894 Chronic pain syndrome: Secondary | ICD-10-CM | POA: Diagnosis not present

## 2019-02-11 DIAGNOSIS — D509 Iron deficiency anemia, unspecified: Secondary | ICD-10-CM | POA: Diagnosis not present

## 2019-02-11 DIAGNOSIS — N186 End stage renal disease: Secondary | ICD-10-CM | POA: Diagnosis not present

## 2019-02-11 DIAGNOSIS — N2581 Secondary hyperparathyroidism of renal origin: Secondary | ICD-10-CM | POA: Diagnosis not present

## 2019-02-11 DIAGNOSIS — E875 Hyperkalemia: Secondary | ICD-10-CM | POA: Diagnosis not present

## 2019-02-11 DIAGNOSIS — D631 Anemia in chronic kidney disease: Secondary | ICD-10-CM | POA: Diagnosis not present

## 2019-02-11 DIAGNOSIS — Z79899 Other long term (current) drug therapy: Secondary | ICD-10-CM | POA: Diagnosis not present

## 2019-02-13 DIAGNOSIS — N186 End stage renal disease: Secondary | ICD-10-CM | POA: Diagnosis not present

## 2019-02-13 DIAGNOSIS — D509 Iron deficiency anemia, unspecified: Secondary | ICD-10-CM | POA: Diagnosis not present

## 2019-02-13 DIAGNOSIS — Z992 Dependence on renal dialysis: Secondary | ICD-10-CM | POA: Diagnosis not present

## 2019-02-13 DIAGNOSIS — D631 Anemia in chronic kidney disease: Secondary | ICD-10-CM | POA: Diagnosis not present

## 2019-02-13 DIAGNOSIS — E875 Hyperkalemia: Secondary | ICD-10-CM | POA: Diagnosis not present

## 2019-02-13 DIAGNOSIS — N2581 Secondary hyperparathyroidism of renal origin: Secondary | ICD-10-CM | POA: Diagnosis not present

## 2019-02-15 IMAGING — DX DG ABDOMEN ACUTE W/ 1V CHEST
3 series · 3 of 3 positions shown · non-contrast
Comparison: 08/05/2016 CXR, CT abdomen and pelvis [DATE]

CLINICAL DATA: Abdominal pain, history of CHF, end-stage renal
disease, hypertension, diabetes and renal transplant.

EXAM:
DG ABDOMEN ACUTE W/ 1V CHEST

[abdomen erect]
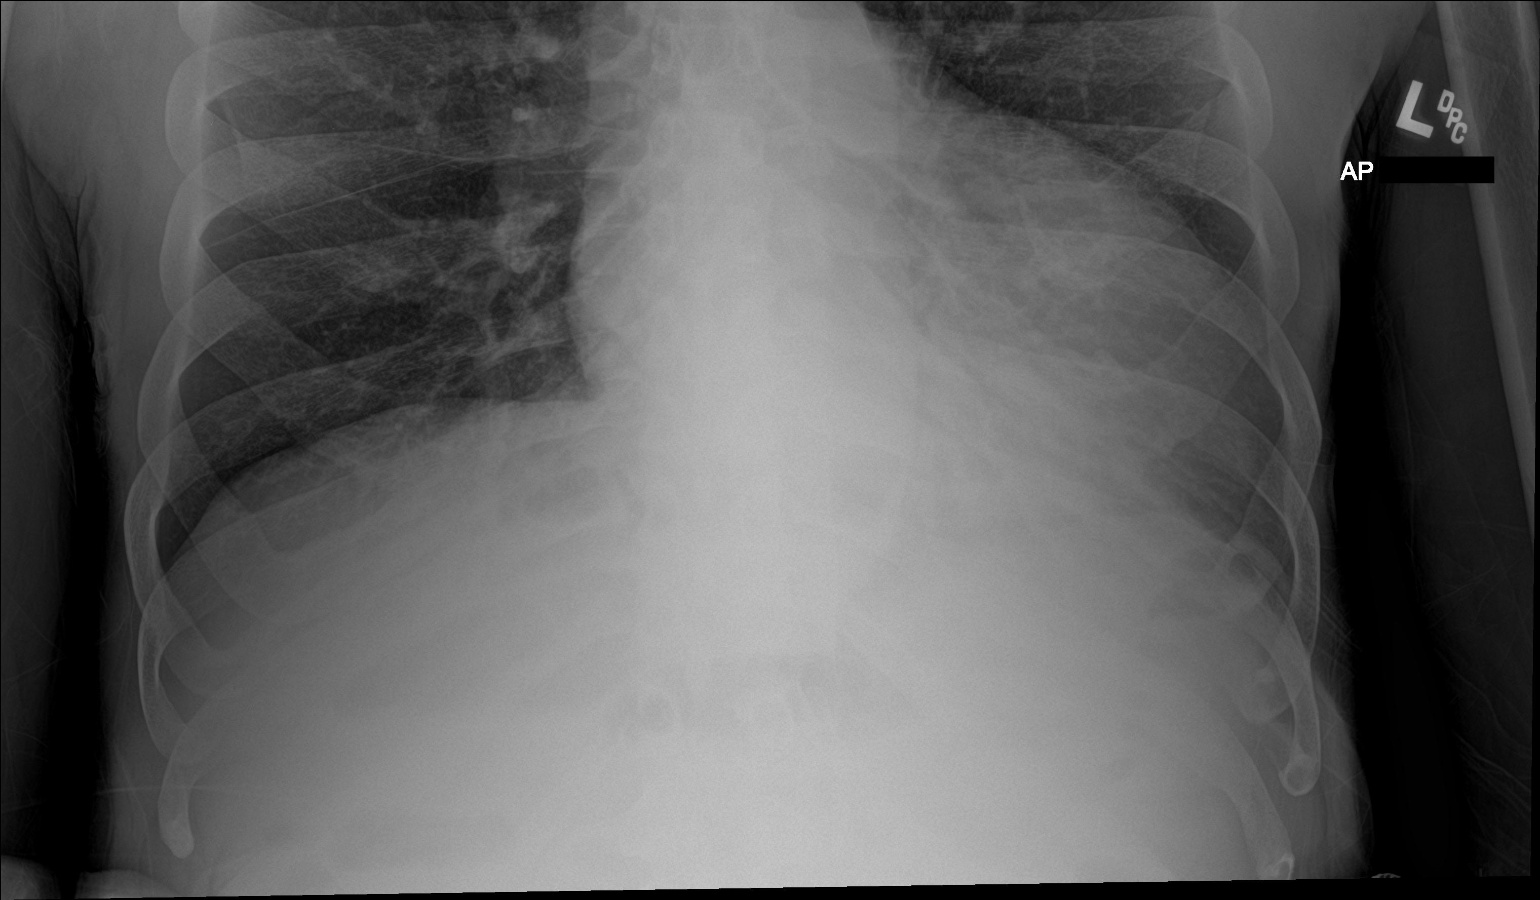

[abdomen supine]
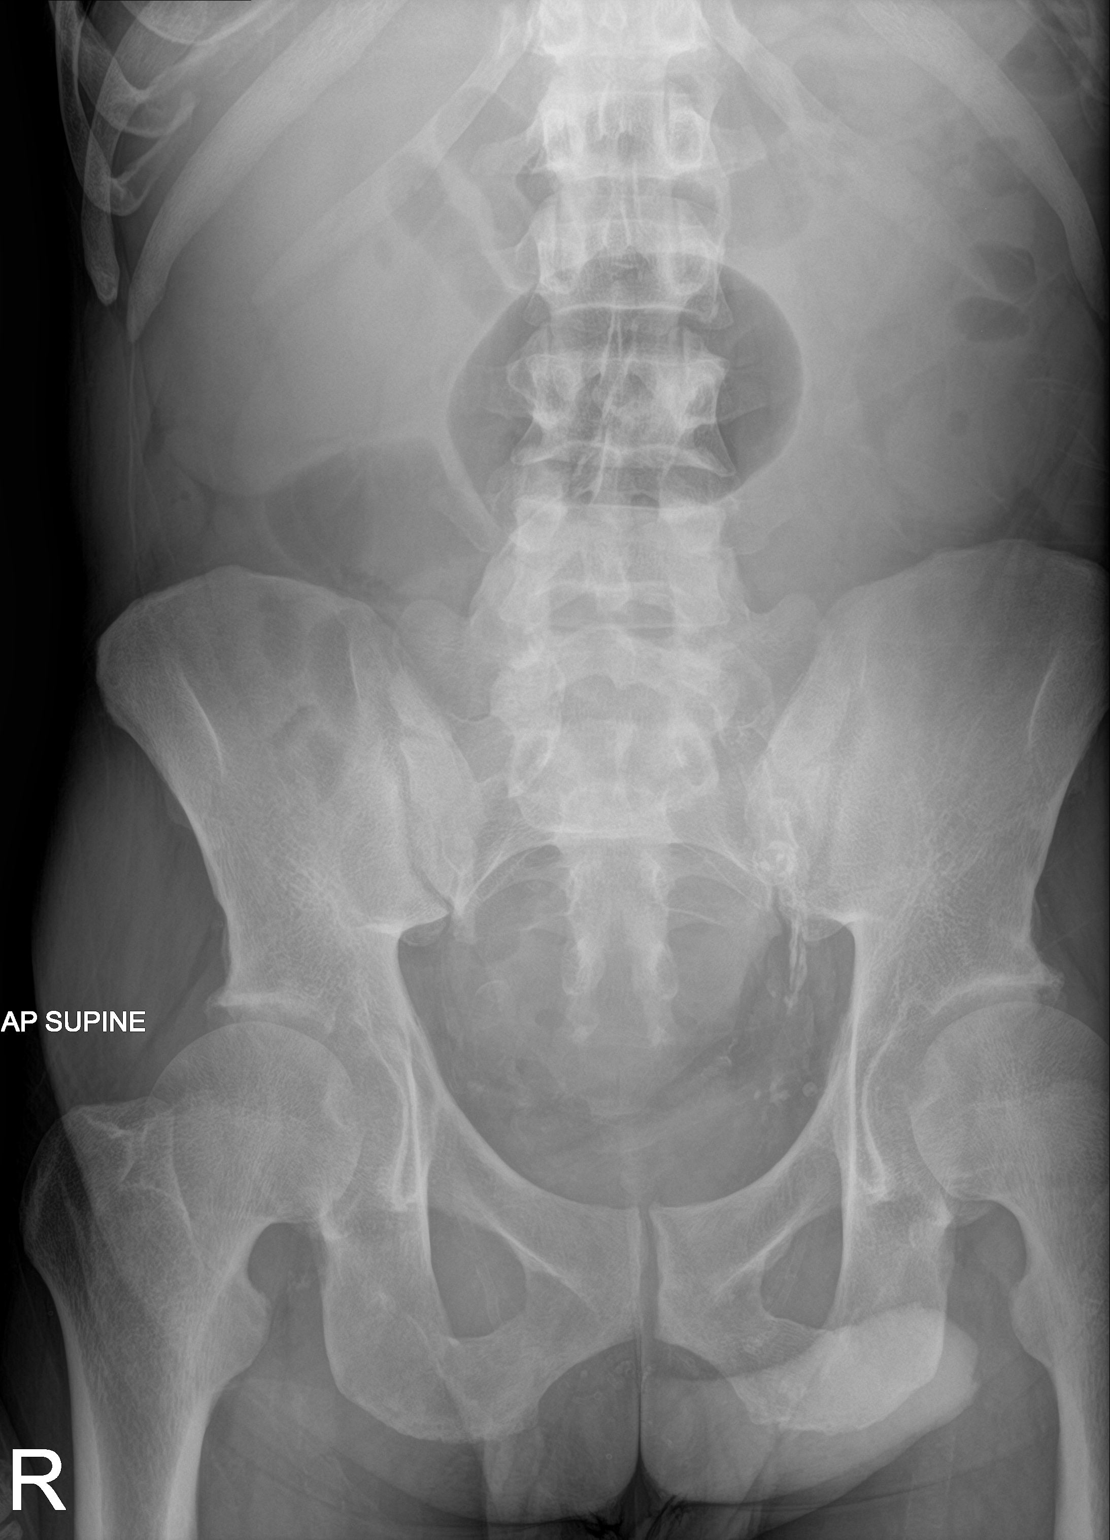

[chest ap]
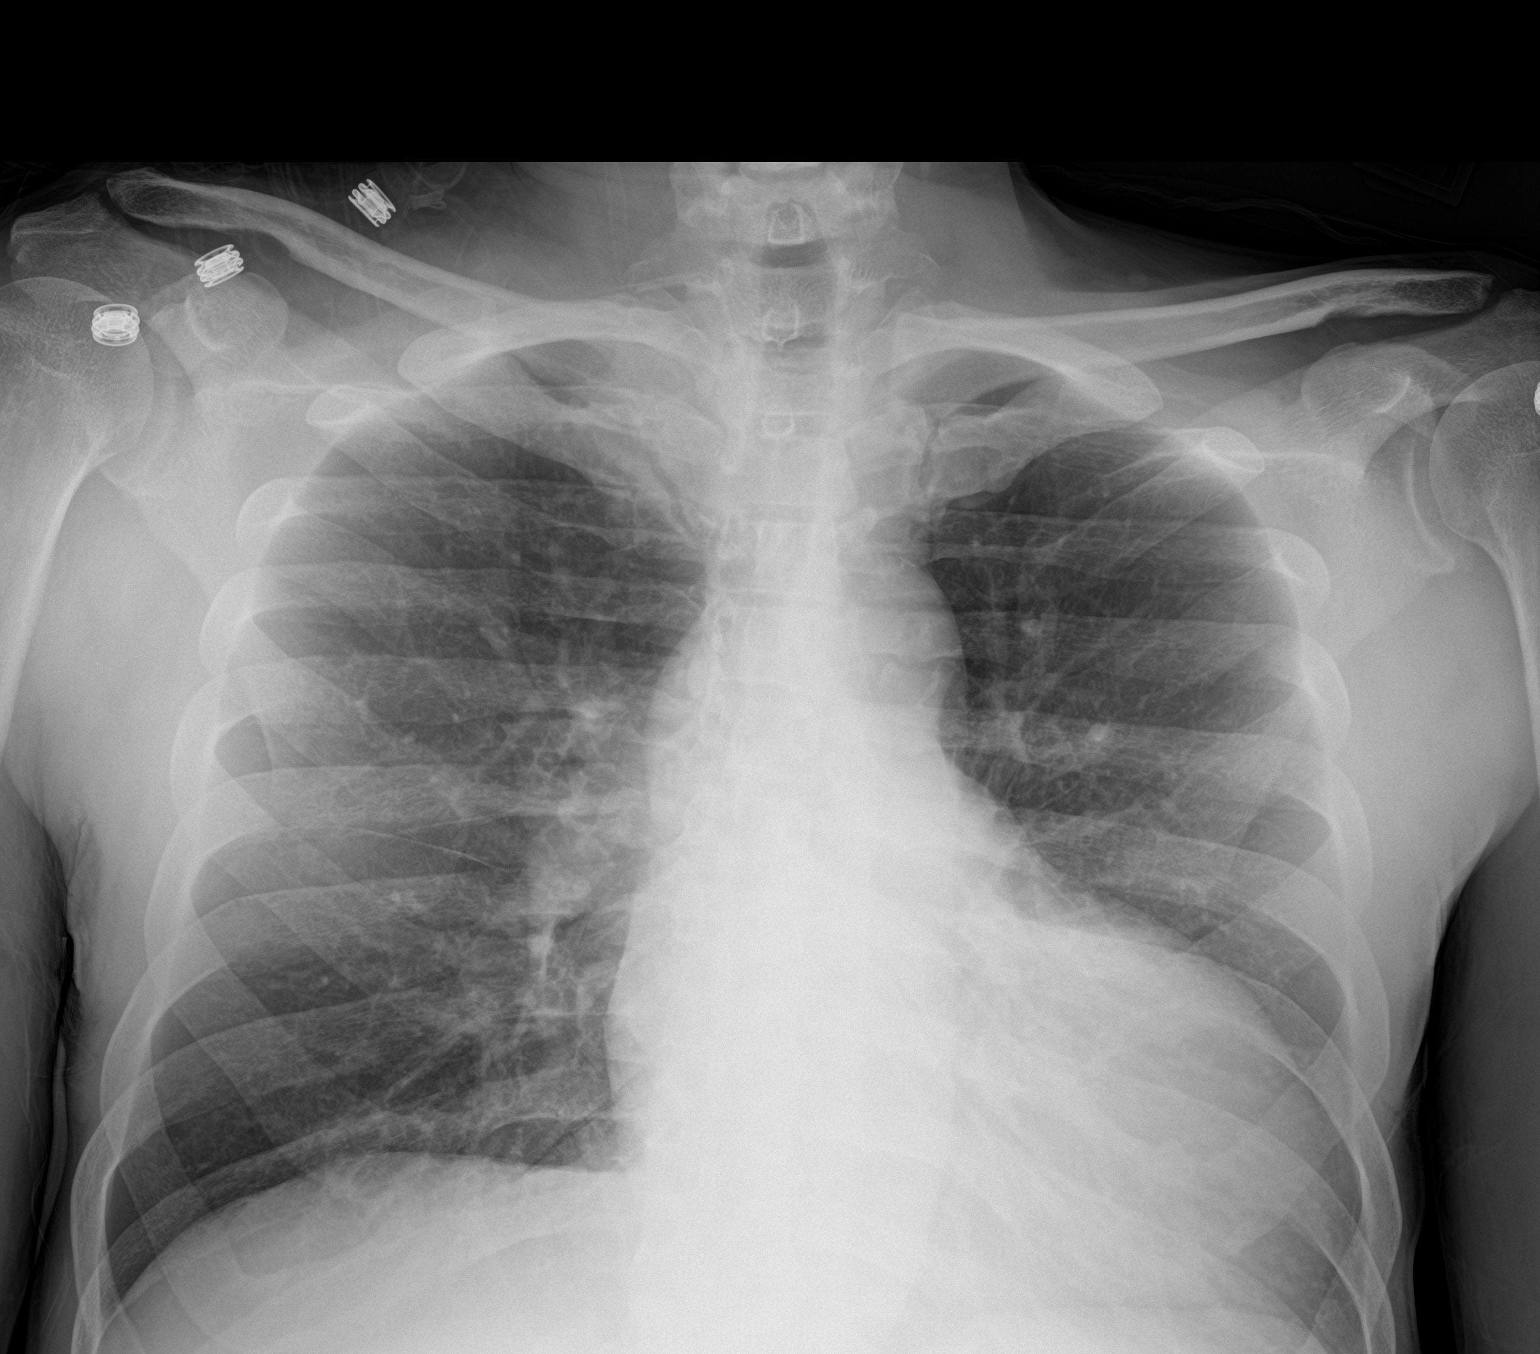

[3 of 3 positions shown; findings below may reference images not displayed]

FINDINGS: Stable cardiomegaly with aortic atherosclerosis. No overt pulmonary
edema, effusion or pneumothorax. No pulmonary consolidation.No bowel
obstruction or free air. Calcifications in the left hemipelvis
associated with transplanted left kidney with atherosclerosis. No
acute osseous abnormality.
IMPRESSION: Stable cardiomegaly with aortic atherosclerosis. No bowel
obstruction or free air.

## 2019-02-16 DIAGNOSIS — D631 Anemia in chronic kidney disease: Secondary | ICD-10-CM | POA: Diagnosis not present

## 2019-02-16 DIAGNOSIS — N2581 Secondary hyperparathyroidism of renal origin: Secondary | ICD-10-CM | POA: Diagnosis not present

## 2019-02-16 DIAGNOSIS — Z992 Dependence on renal dialysis: Secondary | ICD-10-CM | POA: Diagnosis not present

## 2019-02-16 DIAGNOSIS — D509 Iron deficiency anemia, unspecified: Secondary | ICD-10-CM | POA: Diagnosis not present

## 2019-02-16 DIAGNOSIS — N186 End stage renal disease: Secondary | ICD-10-CM | POA: Diagnosis not present

## 2019-02-16 DIAGNOSIS — E875 Hyperkalemia: Secondary | ICD-10-CM | POA: Diagnosis not present

## 2019-02-16 IMAGING — CT CT ABD-PELV W/ CM
2 of 5 series · 16 of 46 positions shown, 18 images · IV contrast (Isovue)
Comparison: 07/29/2016 CT

CLINICAL DATA: Epigastric pain and vomiting

EXAM:
CT ABDOMEN AND PELVIS WITH CONTRAST
TECHNIQUE: Multidetector CT imaging of the abdomen and pelvis was performed
using the standard protocol following bolus administration of
intravenous contrast.
CONTRAST:  30mL 5KQQCL-P00 IOPAMIDOL (5KQQCL-P00) INJECTION 61%,
100mL 5KQQCL-P00 IOPAMIDOL (5KQQCL-P00) INJECTION 61%

[Series 2: axial st · axial · 0.70mm/px · z∈[+741,+1176]mm · 13 of 99 slices shown, 15 images]
[im 6/99  soft-tissue]
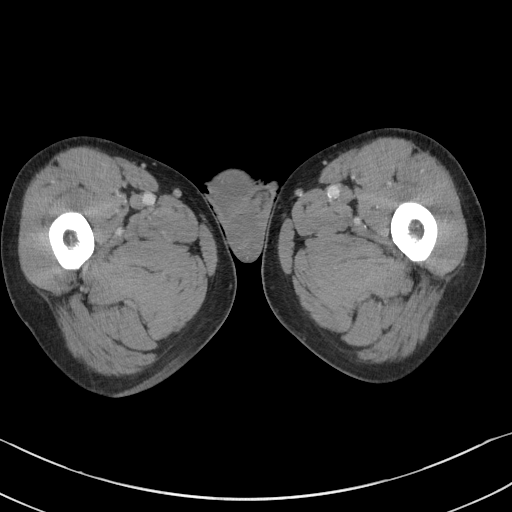
[im 6/99  bone]
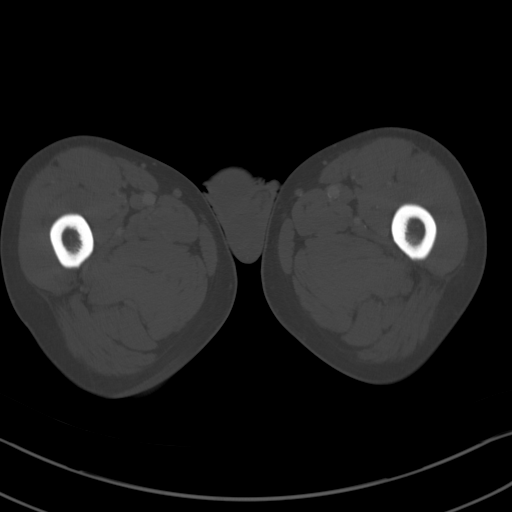
[im 16/99  soft-tissue]
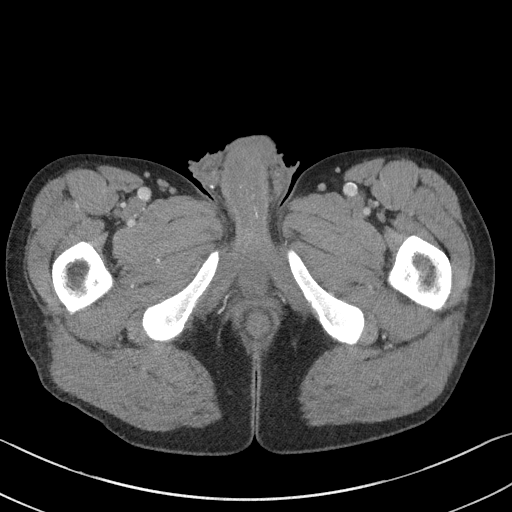
[im 21/99  soft-tissue]
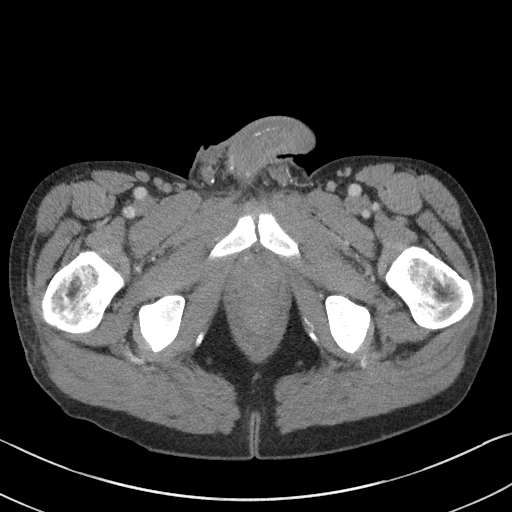
[im 26/99  soft-tissue]
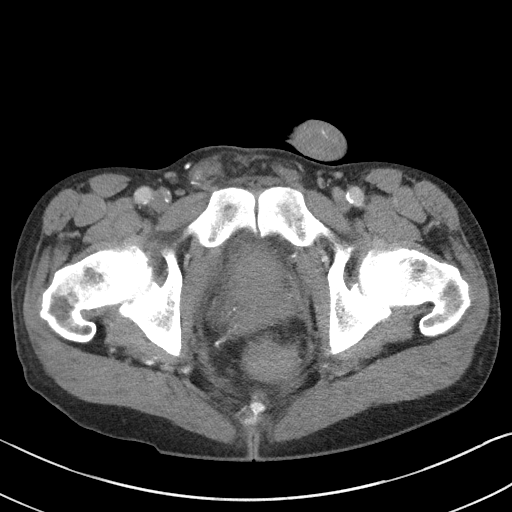
[im 37/99  soft-tissue]
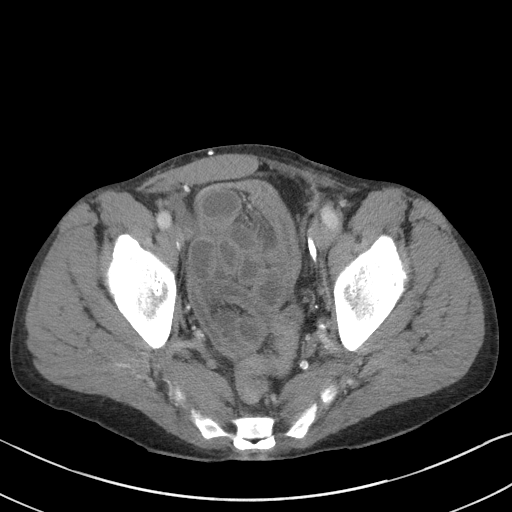
[im 42/99  soft-tissue]
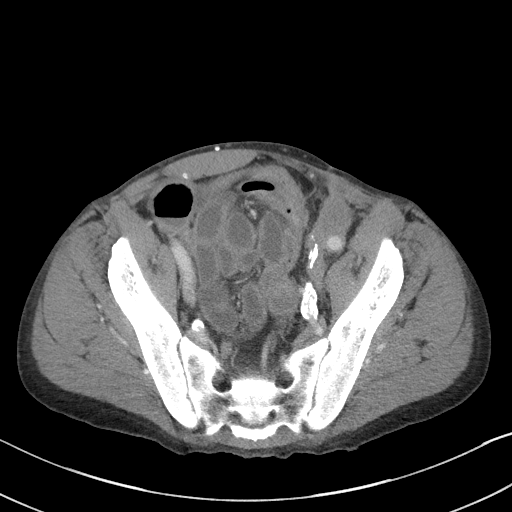
[im 52/99  soft-tissue]
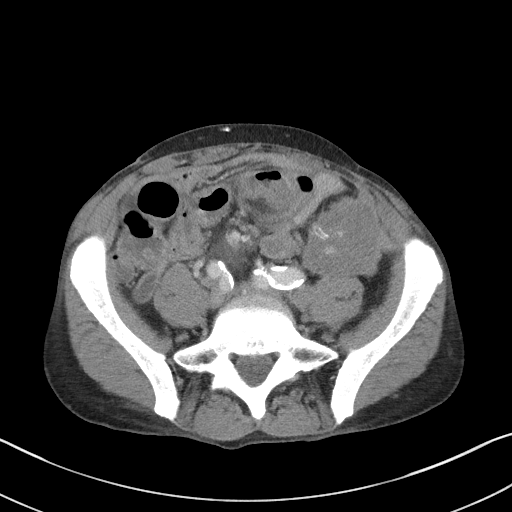
[im 57/99  soft-tissue]
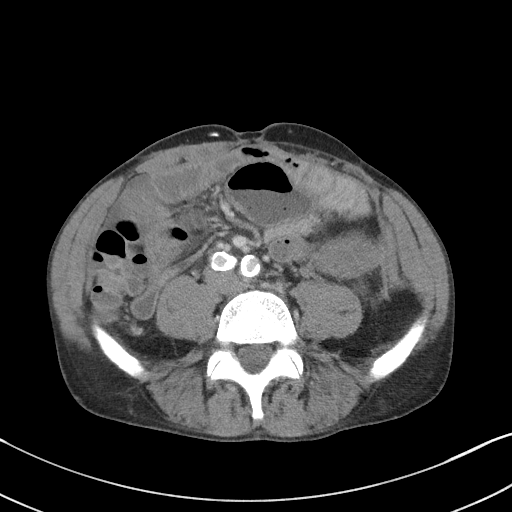
[im 62/99  soft-tissue]
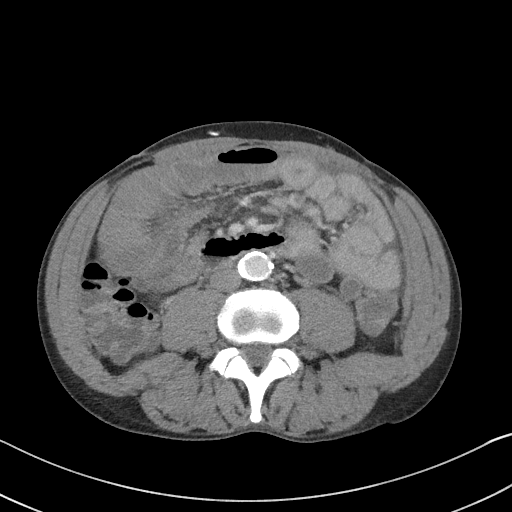
[im 62/99  bone]
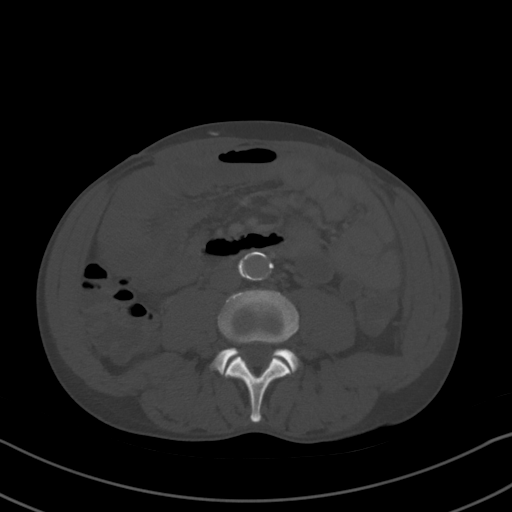
[im 73/99  soft-tissue]
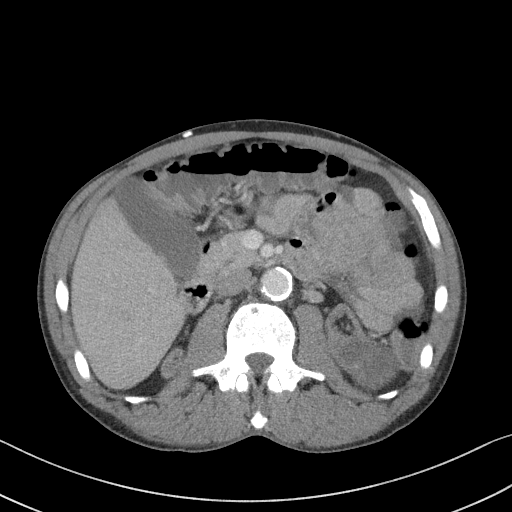
[im 78/99  soft-tissue]
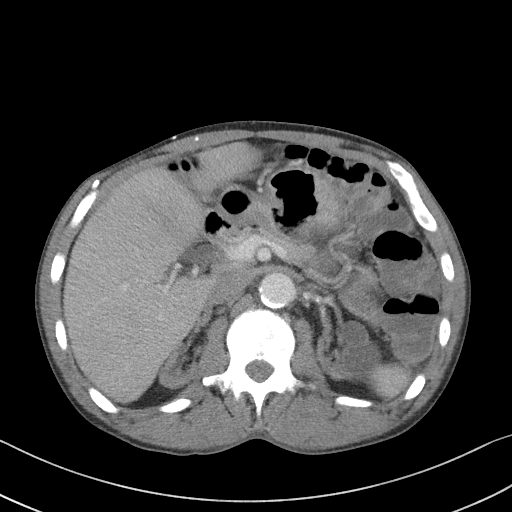
[im 83/99  soft-tissue]
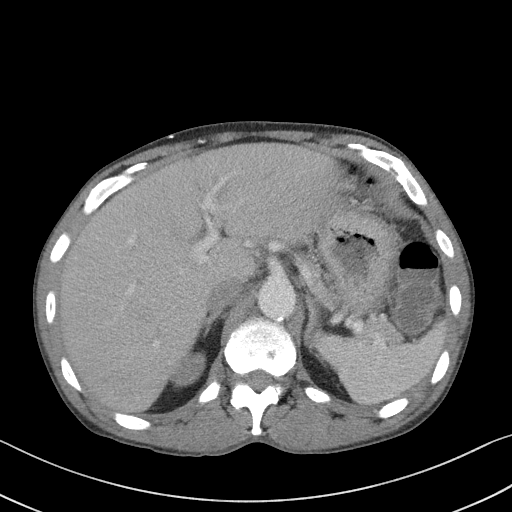
[im 93/99  soft-tissue]
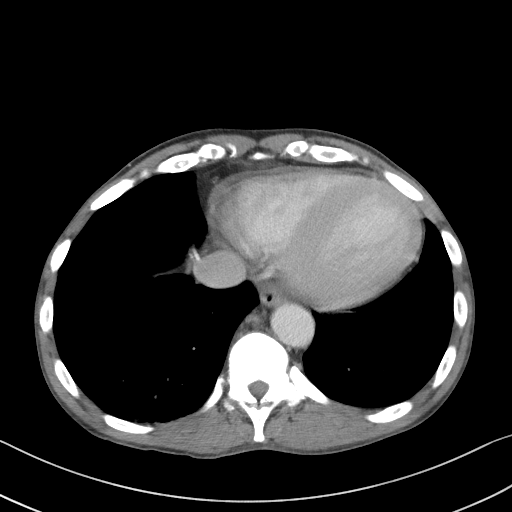

[Series 5: coronal st · coronal · 0.86mm/px · 3 of 79 slices shown]
[im 27/79  soft-tissue]
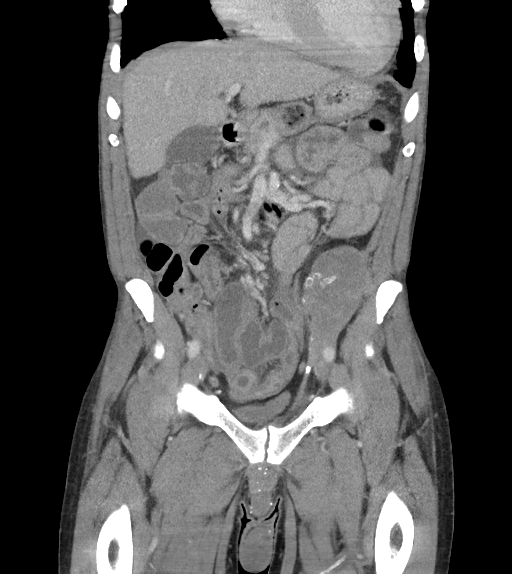
[im 35/79  soft-tissue]
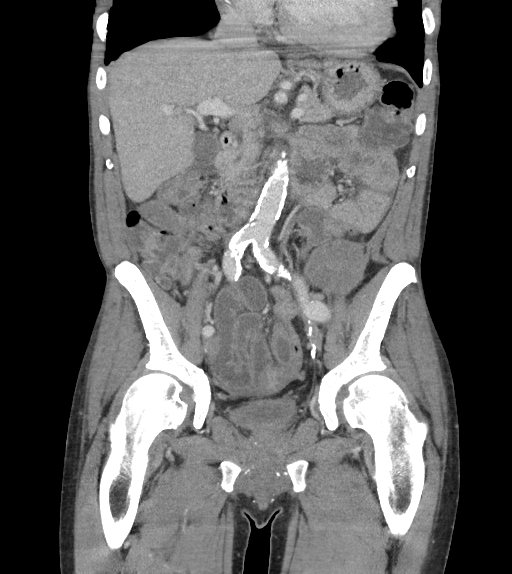
[im 44/79  soft-tissue]
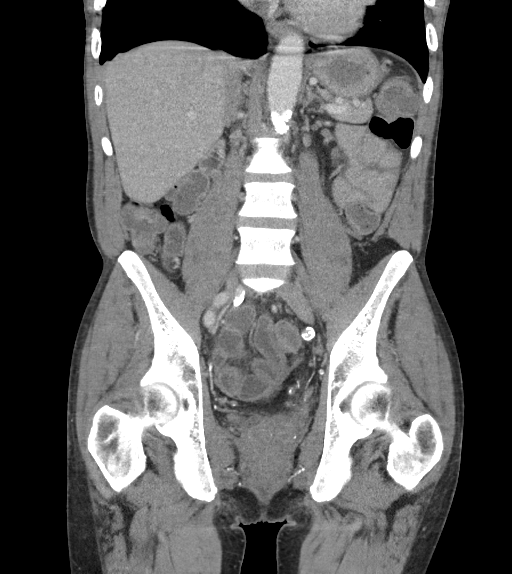

[16 of 46 positions shown; findings below may reference images not displayed]

FINDINGS: Lower chest: Cardiomegaly with bibasilar dependent atelectasis. No
pericardial effusion.

Hepatobiliary: No focal liver abnormality is seen. Subtle hypodense
appearance of the liver may reflect mild fatty infiltration. No
gallstones, gallbladder wall thickening, or biliary dilatation.

Pancreas: Unremarkable. No pancreatic ductal dilatation or
surrounding inflammatory changes.

Spleen: Normal in size without focal abnormality.

Adrenals/Urinary Tract: Native kidneys are atrophic in appearance.
Cysts of the left kidney are stable in appearance, the largest is
approximately 4.1 cm. The urinary bladder is nondistended and
slightly thick-walled as a result. Left pelvic transplanted kidney
demonstrates prominent vascular calcifications.

Stomach/Bowel: Nondistended stomach. Normal small bowel rotation.
There are mildly dilated fluid-filled distal small bowel loops with
fluid within nondistended nonobstructed appearing large bowel
potentially representing an adynamic ileus or enterocolitis.
Transition point is not visualized. There are loops of nondistended
contracted appearing small bowel in the left hemiabdomen. Centrally
within the abdomen, deep to the umbilicus is a 4.4 x 6.1 cm
structure containing an air-fluid level. This likely represents a
mildly dilated loop of large intestine based on what appears to be a
haustral markings on the lateral view and less likely an
extraluminal abscess. A follow-up CT with oral contrast may for
better assessment on this matter if clinically warranted.

Vascular/Lymphatic: Density aortoiliac atherosclerosis. No
adenopathy.

Reproductive: Prostate is mildly enlarged to 4.7 cm appear

Other: No abdominal wall hernia or abnormality. No abdominopelvic
ascites.

Musculoskeletal: No acute or significant osseous findings.
IMPRESSION: Mild to moderate fluid-filled distention of small large bowel
suspicious for an enterocolitis. No focal mechanical source of
obstruction is identified. Adynamic ileus is also possibility.

Atrophic native kidneys with left-sided renal cysts. Transplanted
left pelvic kidney with extensive vascular calcification.

Cardiomegaly without acute pulmonary disease.

## 2019-02-17 DIAGNOSIS — E875 Hyperkalemia: Secondary | ICD-10-CM | POA: Diagnosis not present

## 2019-02-17 DIAGNOSIS — D631 Anemia in chronic kidney disease: Secondary | ICD-10-CM | POA: Diagnosis not present

## 2019-02-17 DIAGNOSIS — Z992 Dependence on renal dialysis: Secondary | ICD-10-CM | POA: Diagnosis not present

## 2019-02-17 DIAGNOSIS — N2581 Secondary hyperparathyroidism of renal origin: Secondary | ICD-10-CM | POA: Diagnosis not present

## 2019-02-17 DIAGNOSIS — N186 End stage renal disease: Secondary | ICD-10-CM | POA: Diagnosis not present

## 2019-02-17 DIAGNOSIS — D509 Iron deficiency anemia, unspecified: Secondary | ICD-10-CM | POA: Diagnosis not present

## 2019-02-18 DIAGNOSIS — R079 Chest pain, unspecified: Secondary | ICD-10-CM | POA: Diagnosis not present

## 2019-02-18 DIAGNOSIS — M545 Low back pain: Secondary | ICD-10-CM | POA: Diagnosis not present

## 2019-02-18 DIAGNOSIS — M79605 Pain in left leg: Secondary | ICD-10-CM | POA: Diagnosis not present

## 2019-02-18 DIAGNOSIS — M79604 Pain in right leg: Secondary | ICD-10-CM | POA: Diagnosis not present

## 2019-02-18 DIAGNOSIS — M79601 Pain in right arm: Secondary | ICD-10-CM | POA: Diagnosis not present

## 2019-02-20 DIAGNOSIS — Z992 Dependence on renal dialysis: Secondary | ICD-10-CM | POA: Diagnosis not present

## 2019-02-20 DIAGNOSIS — N186 End stage renal disease: Secondary | ICD-10-CM | POA: Diagnosis not present

## 2019-02-20 DIAGNOSIS — T8610 Unspecified complication of kidney transplant: Secondary | ICD-10-CM | POA: Diagnosis not present

## 2019-02-23 ENCOUNTER — Other Ambulatory Visit: Payer: Self-pay

## 2019-02-23 ENCOUNTER — Emergency Department (HOSPITAL_COMMUNITY)
Admission: EM | Admit: 2019-02-23 | Discharge: 2019-02-23 | Disposition: A | Discharge status: Home / Self Care | Payer: Medicare Other | Attending: Emergency Medicine | Admitting: Emergency Medicine

## 2019-02-23 ENCOUNTER — Encounter (HOSPITAL_COMMUNITY): Payer: Self-pay | Admitting: Emergency Medicine

## 2019-02-23 DIAGNOSIS — I120 Hypertensive chronic kidney disease with stage 5 chronic kidney disease or end stage renal disease: Secondary | ICD-10-CM | POA: Diagnosis not present

## 2019-02-23 DIAGNOSIS — Z992 Dependence on renal dialysis: Secondary | ICD-10-CM | POA: Diagnosis not present

## 2019-02-23 DIAGNOSIS — D509 Iron deficiency anemia, unspecified: Secondary | ICD-10-CM | POA: Diagnosis not present

## 2019-02-23 DIAGNOSIS — I132 Hypertensive heart and chronic kidney disease with heart failure and with stage 5 chronic kidney disease, or end stage renal disease: Secondary | ICD-10-CM | POA: Insufficient documentation

## 2019-02-23 DIAGNOSIS — N2581 Secondary hyperparathyroidism of renal origin: Secondary | ICD-10-CM | POA: Diagnosis not present

## 2019-02-23 DIAGNOSIS — E1122 Type 2 diabetes mellitus with diabetic chronic kidney disease: Secondary | ICD-10-CM | POA: Diagnosis not present

## 2019-02-23 DIAGNOSIS — Z7901 Long term (current) use of anticoagulants: Secondary | ICD-10-CM | POA: Insufficient documentation

## 2019-02-23 DIAGNOSIS — N186 End stage renal disease: Secondary | ICD-10-CM | POA: Insufficient documentation

## 2019-02-23 DIAGNOSIS — Z79899 Other long term (current) drug therapy: Secondary | ICD-10-CM | POA: Insufficient documentation

## 2019-02-23 DIAGNOSIS — I4581 Long QT syndrome: Secondary | ICD-10-CM | POA: Diagnosis not present

## 2019-02-23 DIAGNOSIS — Z940 Kidney transplant status: Secondary | ICD-10-CM | POA: Diagnosis not present

## 2019-02-23 DIAGNOSIS — I5042 Chronic combined systolic (congestive) and diastolic (congestive) heart failure: Secondary | ICD-10-CM | POA: Insufficient documentation

## 2019-02-23 DIAGNOSIS — R9431 Abnormal electrocardiogram [ECG] [EKG]: Secondary | ICD-10-CM

## 2019-02-23 DIAGNOSIS — D631 Anemia in chronic kidney disease: Secondary | ICD-10-CM | POA: Diagnosis not present

## 2019-02-23 DIAGNOSIS — Z87891 Personal history of nicotine dependence: Secondary | ICD-10-CM | POA: Insufficient documentation

## 2019-02-23 DIAGNOSIS — Z23 Encounter for immunization: Secondary | ICD-10-CM | POA: Diagnosis not present

## 2019-02-23 DIAGNOSIS — E875 Hyperkalemia: Secondary | ICD-10-CM | POA: Diagnosis not present

## 2019-02-23 LAB — BASIC METABOLIC PANEL
Anion gap: 22 — ABNORMAL HIGH (ref 5–15)
BUN: 93 mg/dL — ABNORMAL HIGH (ref 6–20)
CO2: 15 mmol/L — ABNORMAL LOW (ref 22–32)
Calcium: 7.9 mg/dL — ABNORMAL LOW (ref 8.9–10.3)
Chloride: 100 mmol/L (ref 98–111)
Creatinine, Ser: 16.8 mg/dL — ABNORMAL HIGH (ref 0.61–1.24)
GFR calc Af Amer: 3 mL/min — ABNORMAL LOW (ref >60)
GFR calc non Af Amer: 3 mL/min — ABNORMAL LOW (ref >60)
Glucose, Bld: 81 mg/dL (ref 70–99)
Potassium: 5.5 mmol/L — ABNORMAL HIGH (ref 3.5–5.1)
Sodium: 137 mmol/L (ref 135–145)

## 2019-02-23 LAB — I-STAT CHEM 8, ED
BUN: 81 mg/dL — ABNORMAL HIGH (ref 6–20)
Calcium, Ion: 0.91 mmol/L — ABNORMAL LOW (ref 1.15–1.40)
Chloride: 106 mmol/L (ref 98–111)
Creatinine, Ser: 16.6 mg/dL — ABNORMAL HIGH (ref 0.61–1.24)
Glucose, Bld: 77 mg/dL (ref 70–99)
HCT: 39 % (ref 39.0–52.0)
Hemoglobin: 13.3 g/dL (ref 13.0–17.0)
Potassium: 5.5 mmol/L — ABNORMAL HIGH (ref 3.5–5.1)
Sodium: 137 mmol/L (ref 135–145)
TCO2: 18 mmol/L — ABNORMAL LOW (ref 22–32)

## 2019-02-23 LAB — CBC WITH DIFFERENTIAL/PLATELET
Abs Immature Granulocytes: 0.02 10*3/uL (ref 0.00–0.07)
Basophils Absolute: 0 10*3/uL (ref 0.0–0.1)
Basophils Relative: 1 %
Eosinophils Absolute: 0.1 10*3/uL (ref 0.0–0.5)
Eosinophils Relative: 3 %
HCT: 36.7 % — ABNORMAL LOW (ref 39.0–52.0)
Hemoglobin: 11.6 g/dL — ABNORMAL LOW (ref 13.0–17.0)
Immature Granulocytes: 1 %
Lymphocytes Relative: 11 %
Lymphs Abs: 0.4 10*3/uL — ABNORMAL LOW (ref 0.7–4.0)
MCH: 28 pg (ref 26.0–34.0)
MCHC: 31.6 g/dL (ref 30.0–36.0)
MCV: 88.6 fL (ref 80.0–100.0)
Monocytes Absolute: 0.4 10*3/uL (ref 0.1–1.0)
Monocytes Relative: 13 %
Neutro Abs: 2.4 10*3/uL (ref 1.7–7.7)
Neutrophils Relative %: 71 %
Platelets: 94 10*3/uL — ABNORMAL LOW (ref 150–400)
RBC: 4.14 MIL/uL — ABNORMAL LOW (ref 4.22–5.81)
RDW: 17.1 % — ABNORMAL HIGH (ref 11.5–15.5)
WBC: 3.3 10*3/uL — ABNORMAL LOW (ref 4.0–10.5)
nRBC: 0.6 % — ABNORMAL HIGH (ref 0.0–0.2)

## 2019-02-23 MED ORDER — PROMETHAZINE HCL 25 MG PO TABS
12.5000 mg | ORAL_TABLET | Freq: Once | ORAL | Status: AC
  Administered 2019-02-23: 12.5 mg via ORAL
  Filled 2019-02-23: qty 1

## 2019-02-23 MED ORDER — SODIUM ZIRCONIUM CYCLOSILICATE 10 G PO PACK
10.0000 g | PACK | Freq: Once | ORAL | Status: AC
  Administered 2019-02-23: 10 g via ORAL
  Filled 2019-02-23: qty 1

## 2019-02-23 NOTE — Discharge Instructions (Addendum)
Follow up with dialysis

## 2019-02-23 NOTE — ED Triage Notes (Signed)
Pt here with c/o not feeling well , pt states that he is due for dialysis today at 10 am , pt is refusing to get blood drawn
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
## 2019-02-25 DIAGNOSIS — Z86718 Personal history of other venous thrombosis and embolism: Secondary | ICD-10-CM | POA: Diagnosis not present

## 2019-02-25 DIAGNOSIS — N186 End stage renal disease: Secondary | ICD-10-CM | POA: Diagnosis not present

## 2019-02-25 DIAGNOSIS — D509 Iron deficiency anemia, unspecified: Secondary | ICD-10-CM | POA: Diagnosis not present

## 2019-02-25 DIAGNOSIS — Z992 Dependence on renal dialysis: Secondary | ICD-10-CM | POA: Diagnosis not present

## 2019-02-25 DIAGNOSIS — N2581 Secondary hyperparathyroidism of renal origin: Secondary | ICD-10-CM | POA: Diagnosis not present

## 2019-02-25 DIAGNOSIS — D631 Anemia in chronic kidney disease: Secondary | ICD-10-CM | POA: Diagnosis not present

## 2019-02-25 DIAGNOSIS — E875 Hyperkalemia: Secondary | ICD-10-CM | POA: Diagnosis not present

## 2019-02-26 IMAGING — MR MR THORACIC SPINE W/O CM
5 of 13 series · 19 of 48 positions shown · non-contrast
Comparison: None.

CLINICAL DATA: Severe back pain and leg pain. Kidney mass found 1
year ago.

EXAM:
MRI THORACIC AND LUMBAR SPINE WITHOUT CONTRAST
TECHNIQUE: Multiplanar and multiecho pulse sequences of the thoracic and lumbar
spine were obtained without intravenous contrast.

[Series 4: T2 · sagittal · 3.0mm · 0.66mm/px · 3 of 14 slices shown (1 of 5)]
[im 1/14]
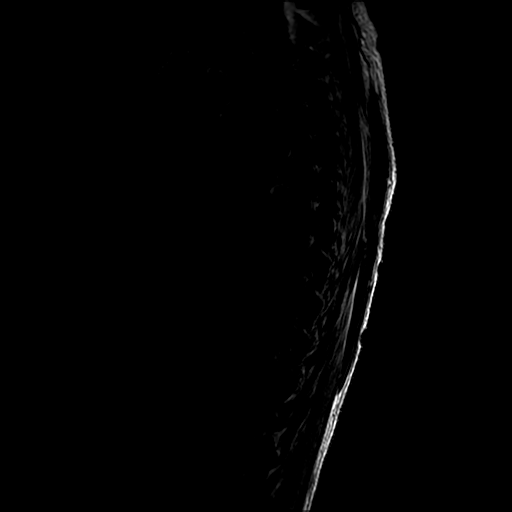
[im 7/14]
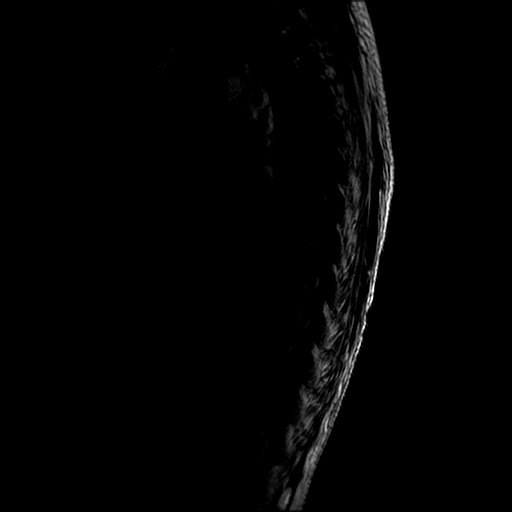
[im 14/14]
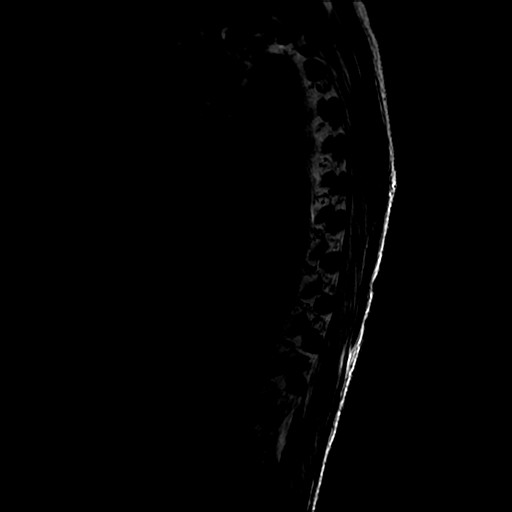

[Series 10: T2 · axial · 4.0mm · 0.39mm/px · z∈[-187,-46]mm · 4 of 27 slices shown (2 of 5)]
[im 1/27]
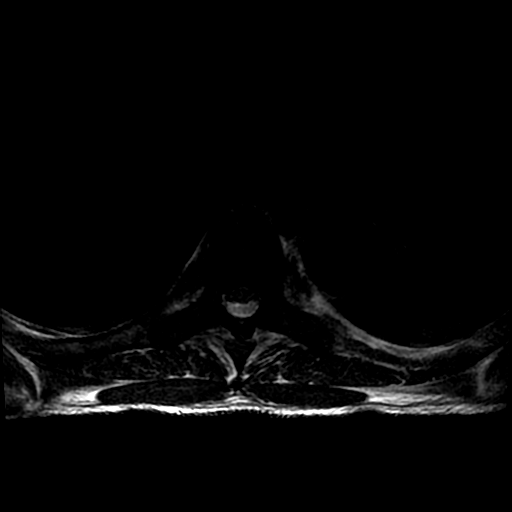
[im 9/27]
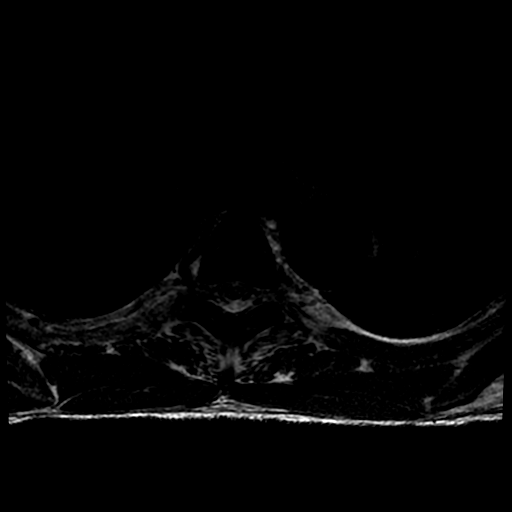
[im 18/27]
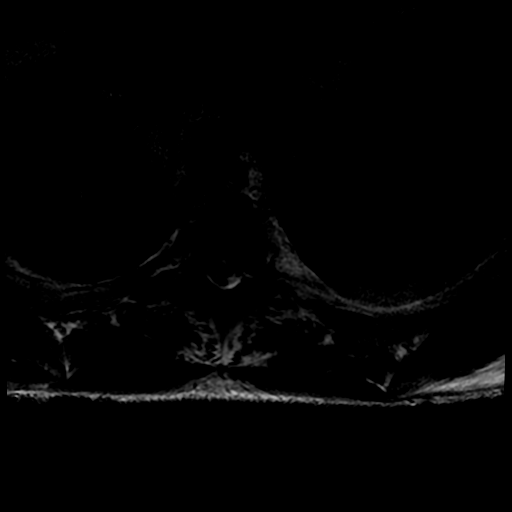
[im 27/27]
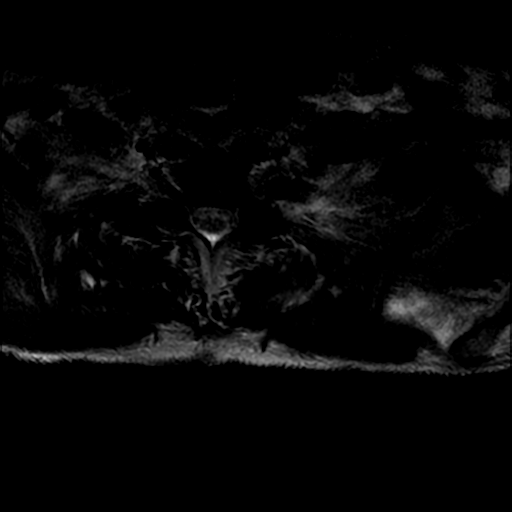

[Series 11: T2 · axial · 4.0mm · 0.39mm/px · z∈[-339,-185]mm · 5 of 29 slices shown (3 of 5)]
[im 1/29]
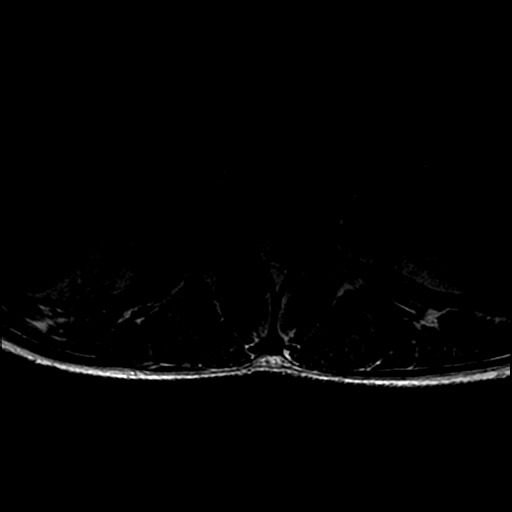
[im 8/29]
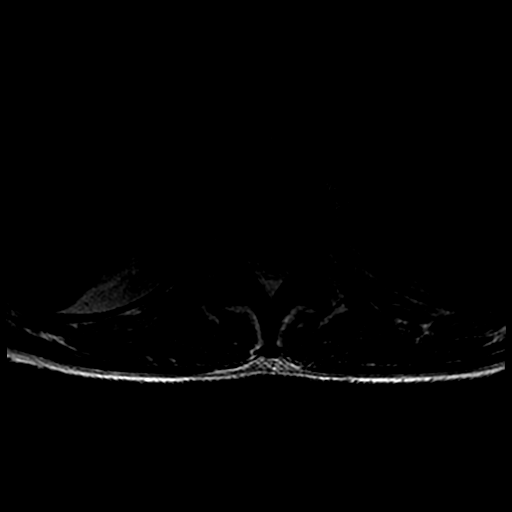
[im 15/29]
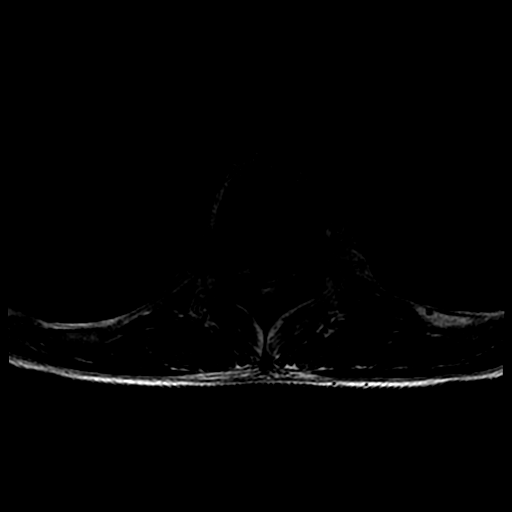
[im 22/29]
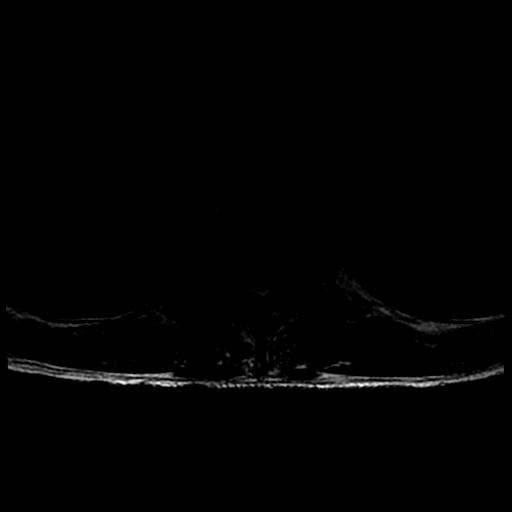
[im 29/29]
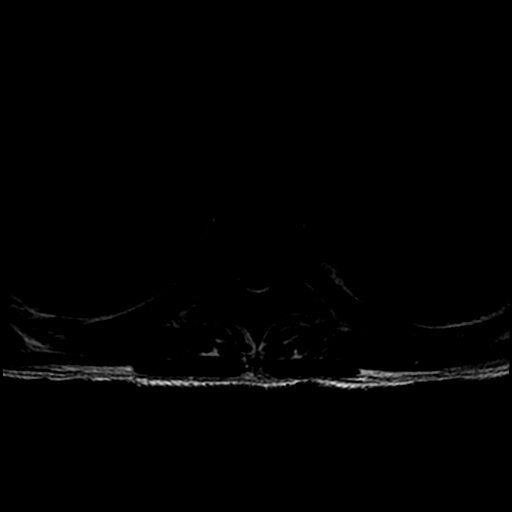

[Series 18: T2 · sagittal · 4.0mm · 1.17mm/px · 2 of 13 slices shown (4 of 5)]
[im 1/13]
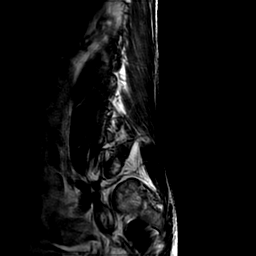
[im 13/13]
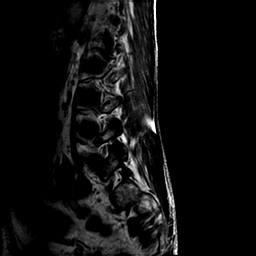

[Series 20: T2 · axial · 4.0mm · 0.39mm/px · z∈[-557,-423]mm · 5 of 42 slices shown (5 of 5)]
[im 1/42]
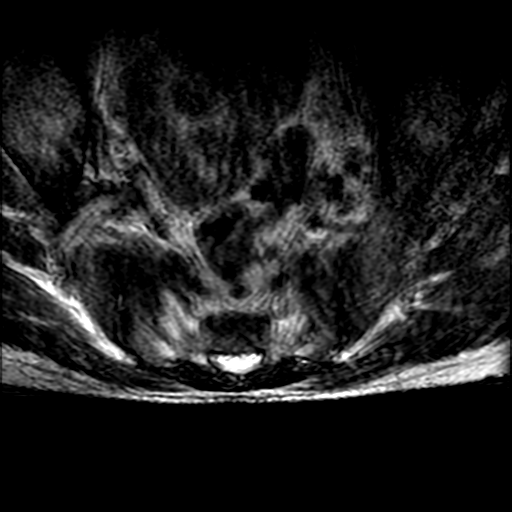
[im 7/42]
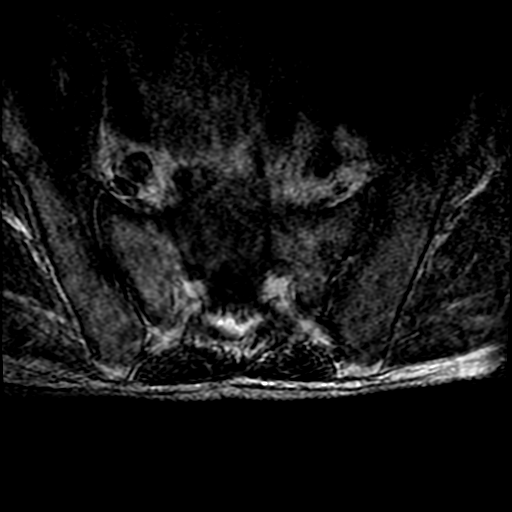
[im 14/42]
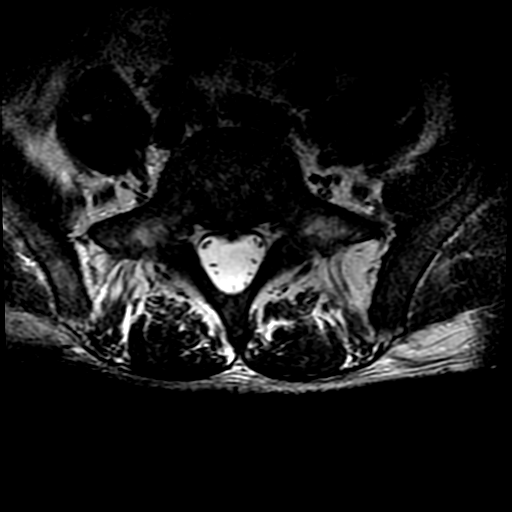
[im 21/42]
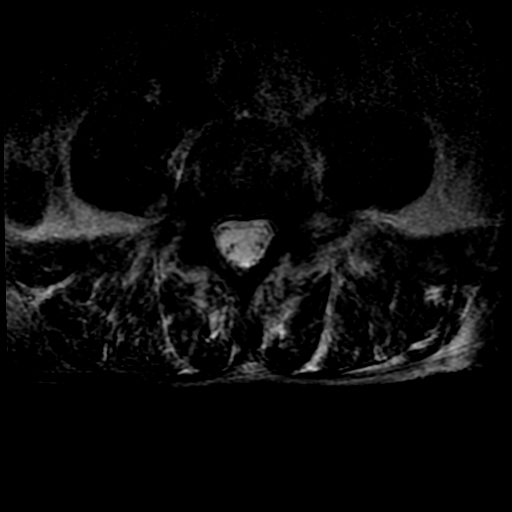
[im 28/42]
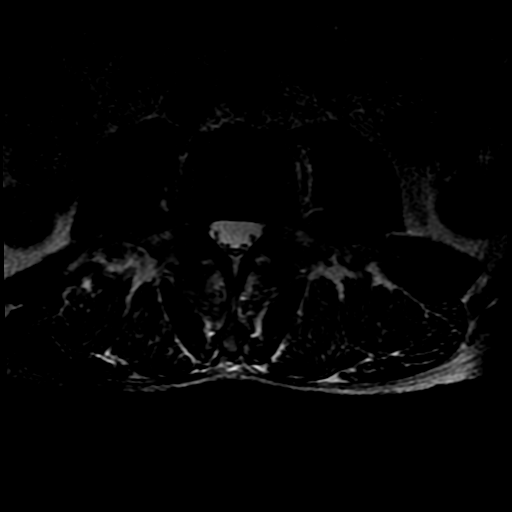

[19 of 48 positions shown; findings below may reference images not displayed]

FINDINGS: MRI THORACIC SPINE FINDINGS

Alignment:  Physiologic.

Vertebrae: No fracture, evidence of discitis, or bone lesion.
Decreased marrow signal as can be seen with renal osteodystrophy.

Cord:  Normal signal and morphology.

Paraspinal and other soft tissues: Negative.

Disc levels:

Disc spaces:  Disc spaces are maintained.

T1-T2: No disc protrusion, foraminal stenosis or central canal
stenosis.

T2-T3: No disc protrusion, foraminal stenosis or central canal
stenosis.

T3-T4: No disc protrusion, foraminal stenosis or central canal
stenosis.

T4-T5: No disc protrusion, foraminal stenosis or central canal
stenosis.

T5-T6: No disc protrusion, foraminal stenosis or central canal
stenosis.

T6-T7: No disc protrusion, foraminal stenosis or central canal
stenosis.

T7-T8: No disc protrusion, foraminal stenosis or central canal
stenosis.

T8-T9: No disc protrusion, foraminal stenosis or central canal
stenosis.

T9-T10: No disc protrusion, foraminal stenosis or central canal
stenosis.

T10-T11: No disc protrusion, foraminal stenosis or central canal
stenosis.

T11-T12: No disc protrusion, foraminal stenosis or central canal
stenosis.

MRI LUMBAR SPINE FINDINGS

Segmentation:  Standard.

Alignment:  Physiologic.

Vertebrae: No fracture, evidence of discitis, or bone lesion.
Decreased marrow signal as can be seen with renal osteodystrophy.

Conus medullaris: Extends to the L1 level and appears normal.

Paraspinal and other soft tissues: No paraspinal abnormality.

Disc levels:

Disc spaces: Disc spaces are maintained.

T12-L1: No significant disc bulge. No evidence of neural foraminal
stenosis. No central canal stenosis.

L1-L2: No significant disc bulge. No evidence of neural foraminal
stenosis. No central canal stenosis.

L2-L3: No significant disc bulge. No evidence of neural foraminal
stenosis. No central canal stenosis.

L3-L4: No significant disc bulge. No evidence of neural foraminal
stenosis. No central canal stenosis.

L4-L5: No significant disc bulge. No evidence of neural foraminal
stenosis. No central canal stenosis.

L5-S1: No significant disc bulge. No evidence of neural foraminal
stenosis. No central canal stenosis.
IMPRESSION: MR THORACIC SPINE IMPRESSION

1. No acute injury of the thoracic spine.
2. No evidence of metastatic disease.

MR LUMBAR SPINE IMPRESSION

1. No acute injury of the lumbar spine.
2. No evidence of metastatic disease.

## 2019-03-02 DIAGNOSIS — Z992 Dependence on renal dialysis: Secondary | ICD-10-CM | POA: Diagnosis not present

## 2019-03-02 DIAGNOSIS — S41101S Unspecified open wound of right upper arm, sequela: Secondary | ICD-10-CM | POA: Diagnosis not present

## 2019-03-02 DIAGNOSIS — Z79891 Long term (current) use of opiate analgesic: Secondary | ICD-10-CM | POA: Diagnosis not present

## 2019-03-02 DIAGNOSIS — N186 End stage renal disease: Secondary | ICD-10-CM | POA: Diagnosis not present

## 2019-03-02 DIAGNOSIS — M545 Low back pain: Secondary | ICD-10-CM | POA: Diagnosis not present

## 2019-03-02 DIAGNOSIS — G8929 Other chronic pain: Secondary | ICD-10-CM | POA: Diagnosis not present

## 2019-03-02 DIAGNOSIS — D509 Iron deficiency anemia, unspecified: Secondary | ICD-10-CM | POA: Diagnosis not present

## 2019-03-02 DIAGNOSIS — N2581 Secondary hyperparathyroidism of renal origin: Secondary | ICD-10-CM | POA: Diagnosis not present

## 2019-03-02 DIAGNOSIS — E875 Hyperkalemia: Secondary | ICD-10-CM | POA: Diagnosis not present

## 2019-03-02 DIAGNOSIS — D631 Anemia in chronic kidney disease: Secondary | ICD-10-CM | POA: Diagnosis not present

## 2019-03-04 DIAGNOSIS — N2581 Secondary hyperparathyroidism of renal origin: Secondary | ICD-10-CM | POA: Diagnosis not present

## 2019-03-04 DIAGNOSIS — N186 End stage renal disease: Secondary | ICD-10-CM | POA: Diagnosis not present

## 2019-03-04 DIAGNOSIS — E875 Hyperkalemia: Secondary | ICD-10-CM | POA: Diagnosis not present

## 2019-03-04 DIAGNOSIS — D631 Anemia in chronic kidney disease: Secondary | ICD-10-CM | POA: Diagnosis not present

## 2019-03-04 DIAGNOSIS — Z992 Dependence on renal dialysis: Secondary | ICD-10-CM | POA: Diagnosis not present

## 2019-03-04 DIAGNOSIS — D509 Iron deficiency anemia, unspecified: Secondary | ICD-10-CM | POA: Diagnosis not present

## 2019-03-04 IMAGING — CR DG ABDOMEN 1V
2 series · 2 of 2 positions shown · non-contrast
Comparison: 09/26/2016

CLINICAL DATA: Shortness of breath, generalized abdominal pain

EXAM:
ABDOMEN - 1 VIEW

[abdomen kub (1 of 2)]
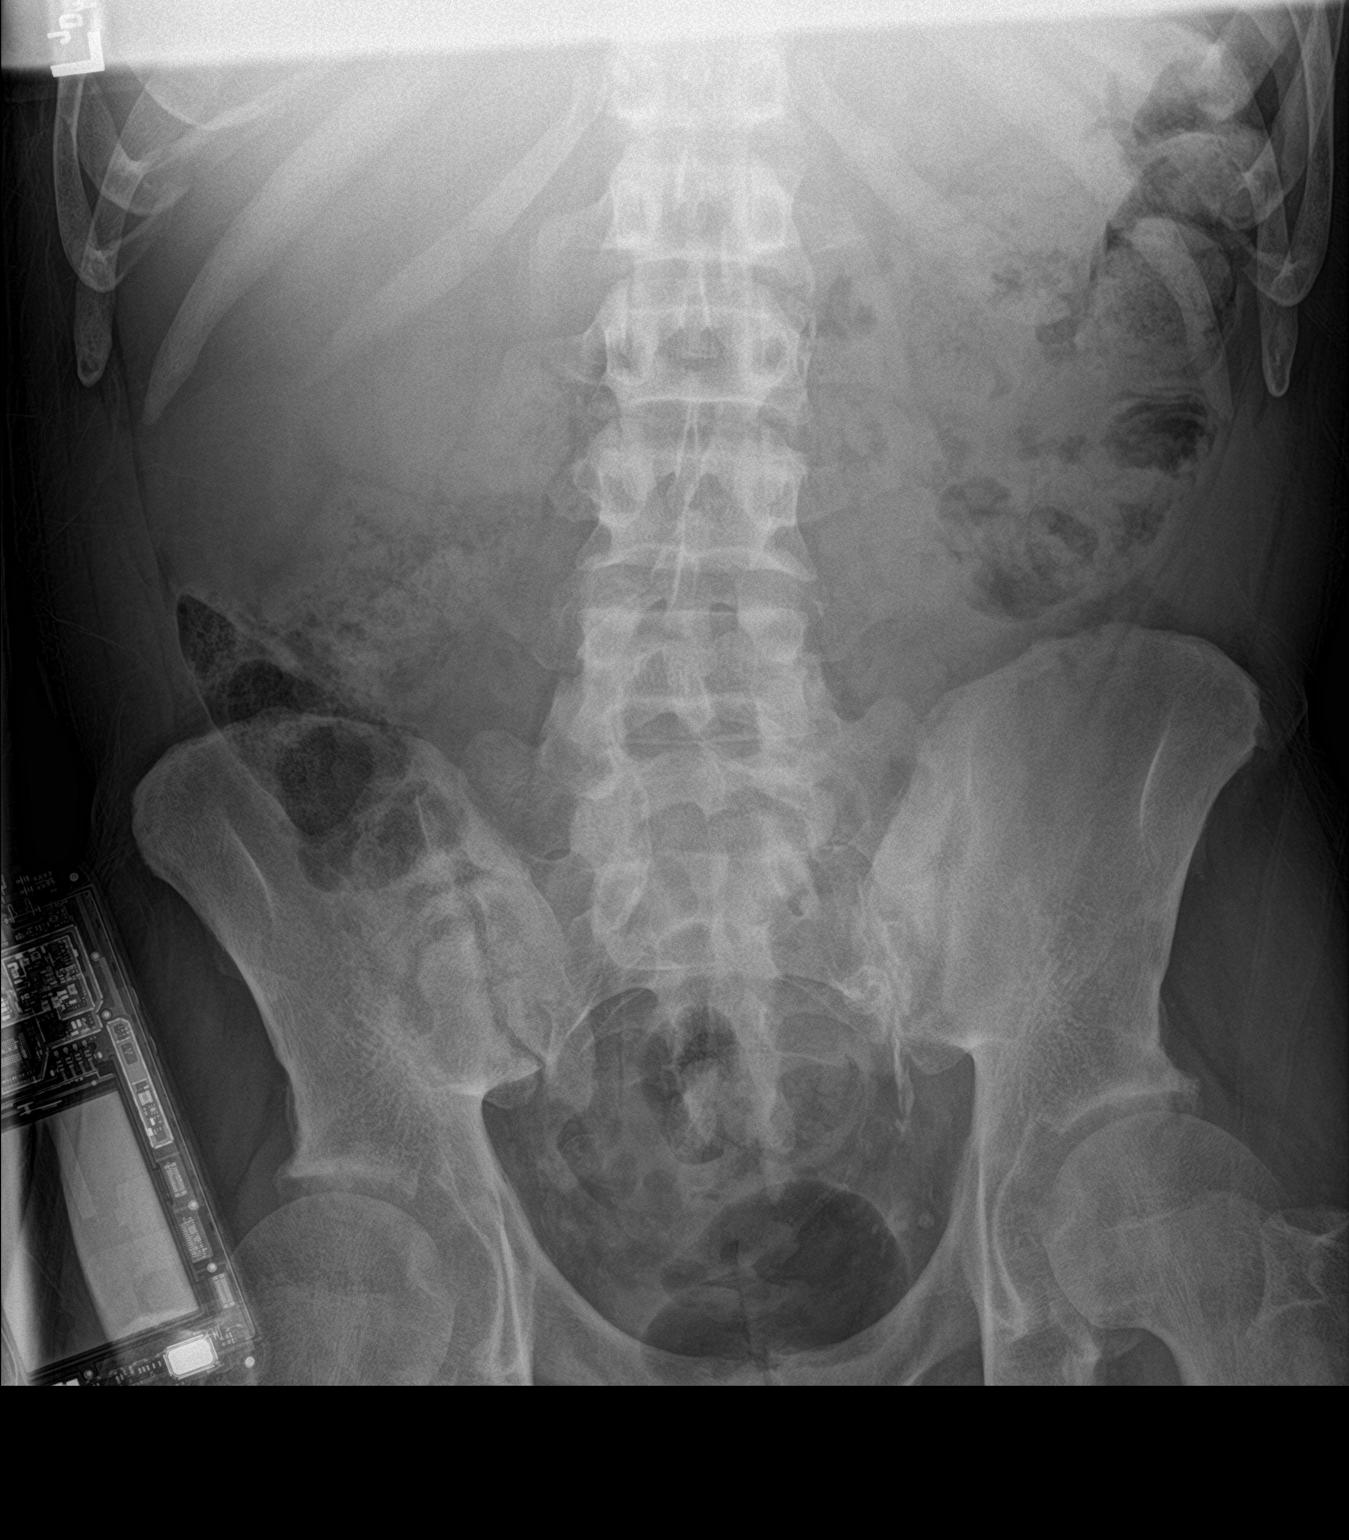

[abdomen kub (2 of 2)]
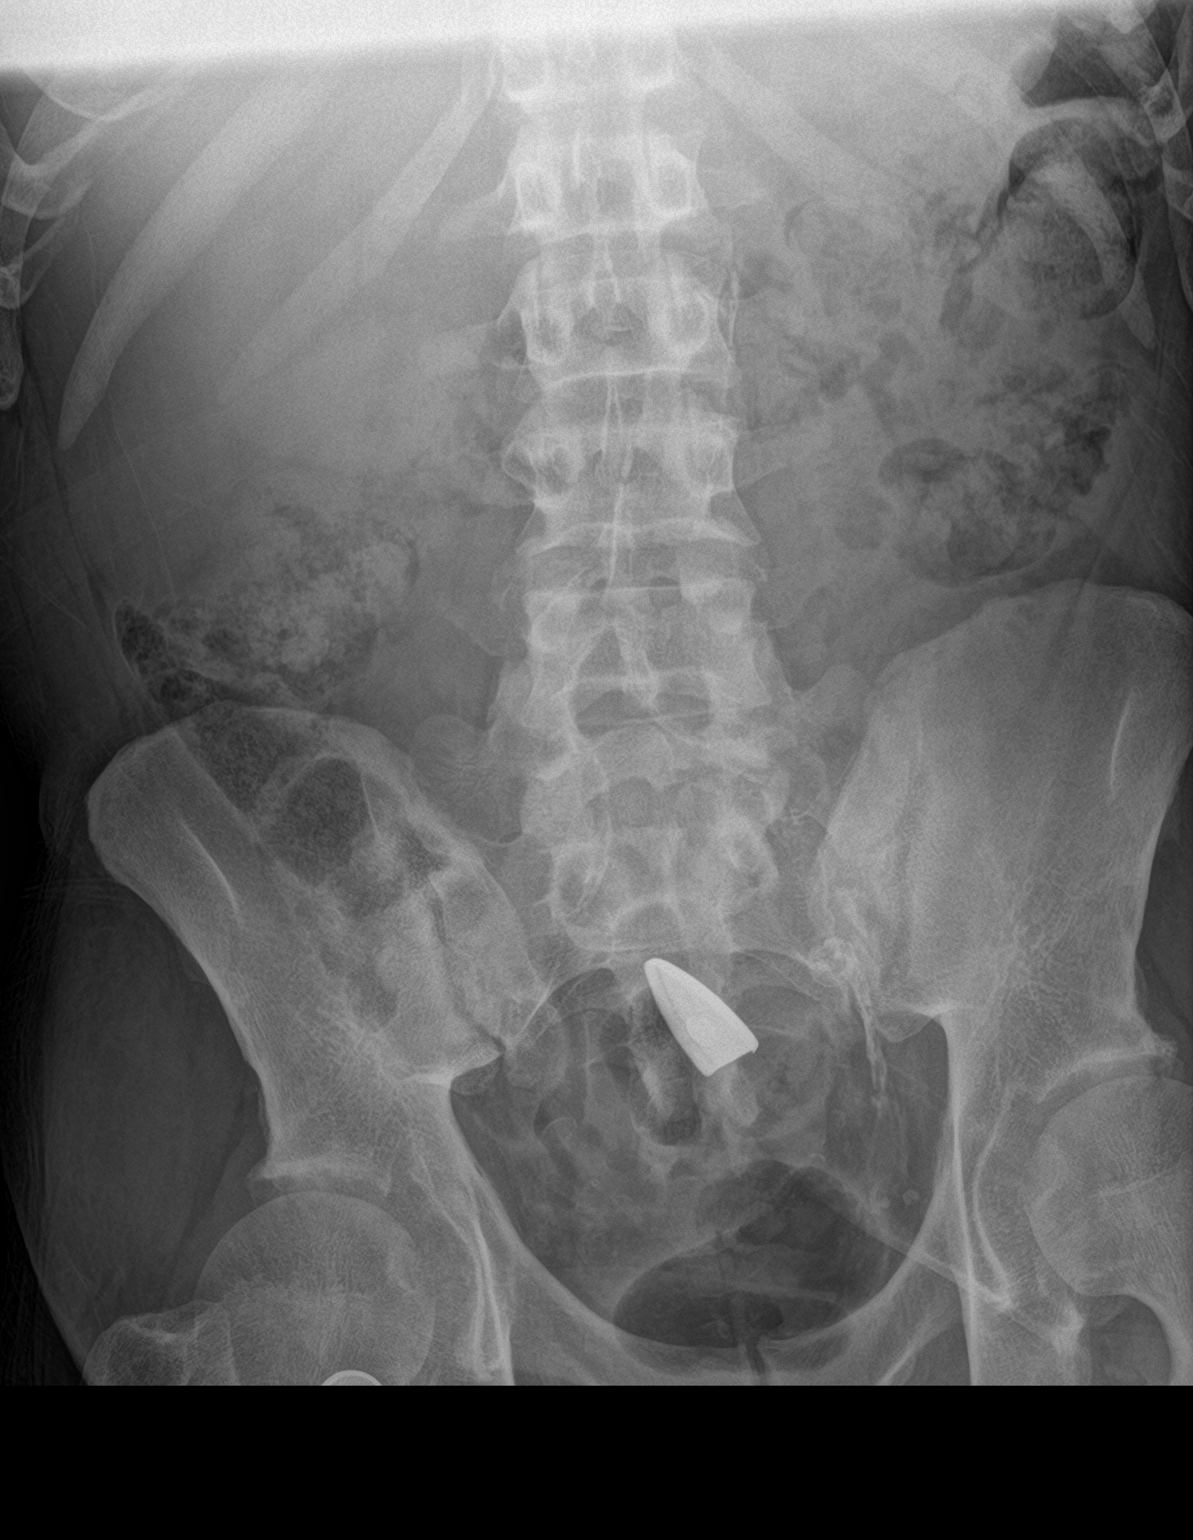

[2 of 2 positions shown; findings below may reference images not displayed]

FINDINGS: There is normal small bowel gas pattern. Some colonic gas and stool
noted in right colon and transverse colon. Colonic gas noted within
rectum.
IMPRESSION: Normal small bowel gas pattern. Some colonic stool and gas noted in
right colon and transverse colon.

## 2019-03-04 IMAGING — CR DG CHEST 2V
2 series · 2 of 2 positions shown · non-contrast
Comparison: 09/26/2016

CLINICAL DATA: SOB and generalized abdominal pain with nausea and
vomiting x 3-4 days. (Pt. Not compliant - thus 2 images with pt.
Artifacts.) Hx of of ESRD, NICM with diastolic and systolic CHF,
HTN, multiple admission to the hospital for abdominal pain.

EXAM:
CHEST  2 VIEW

[chest lat]
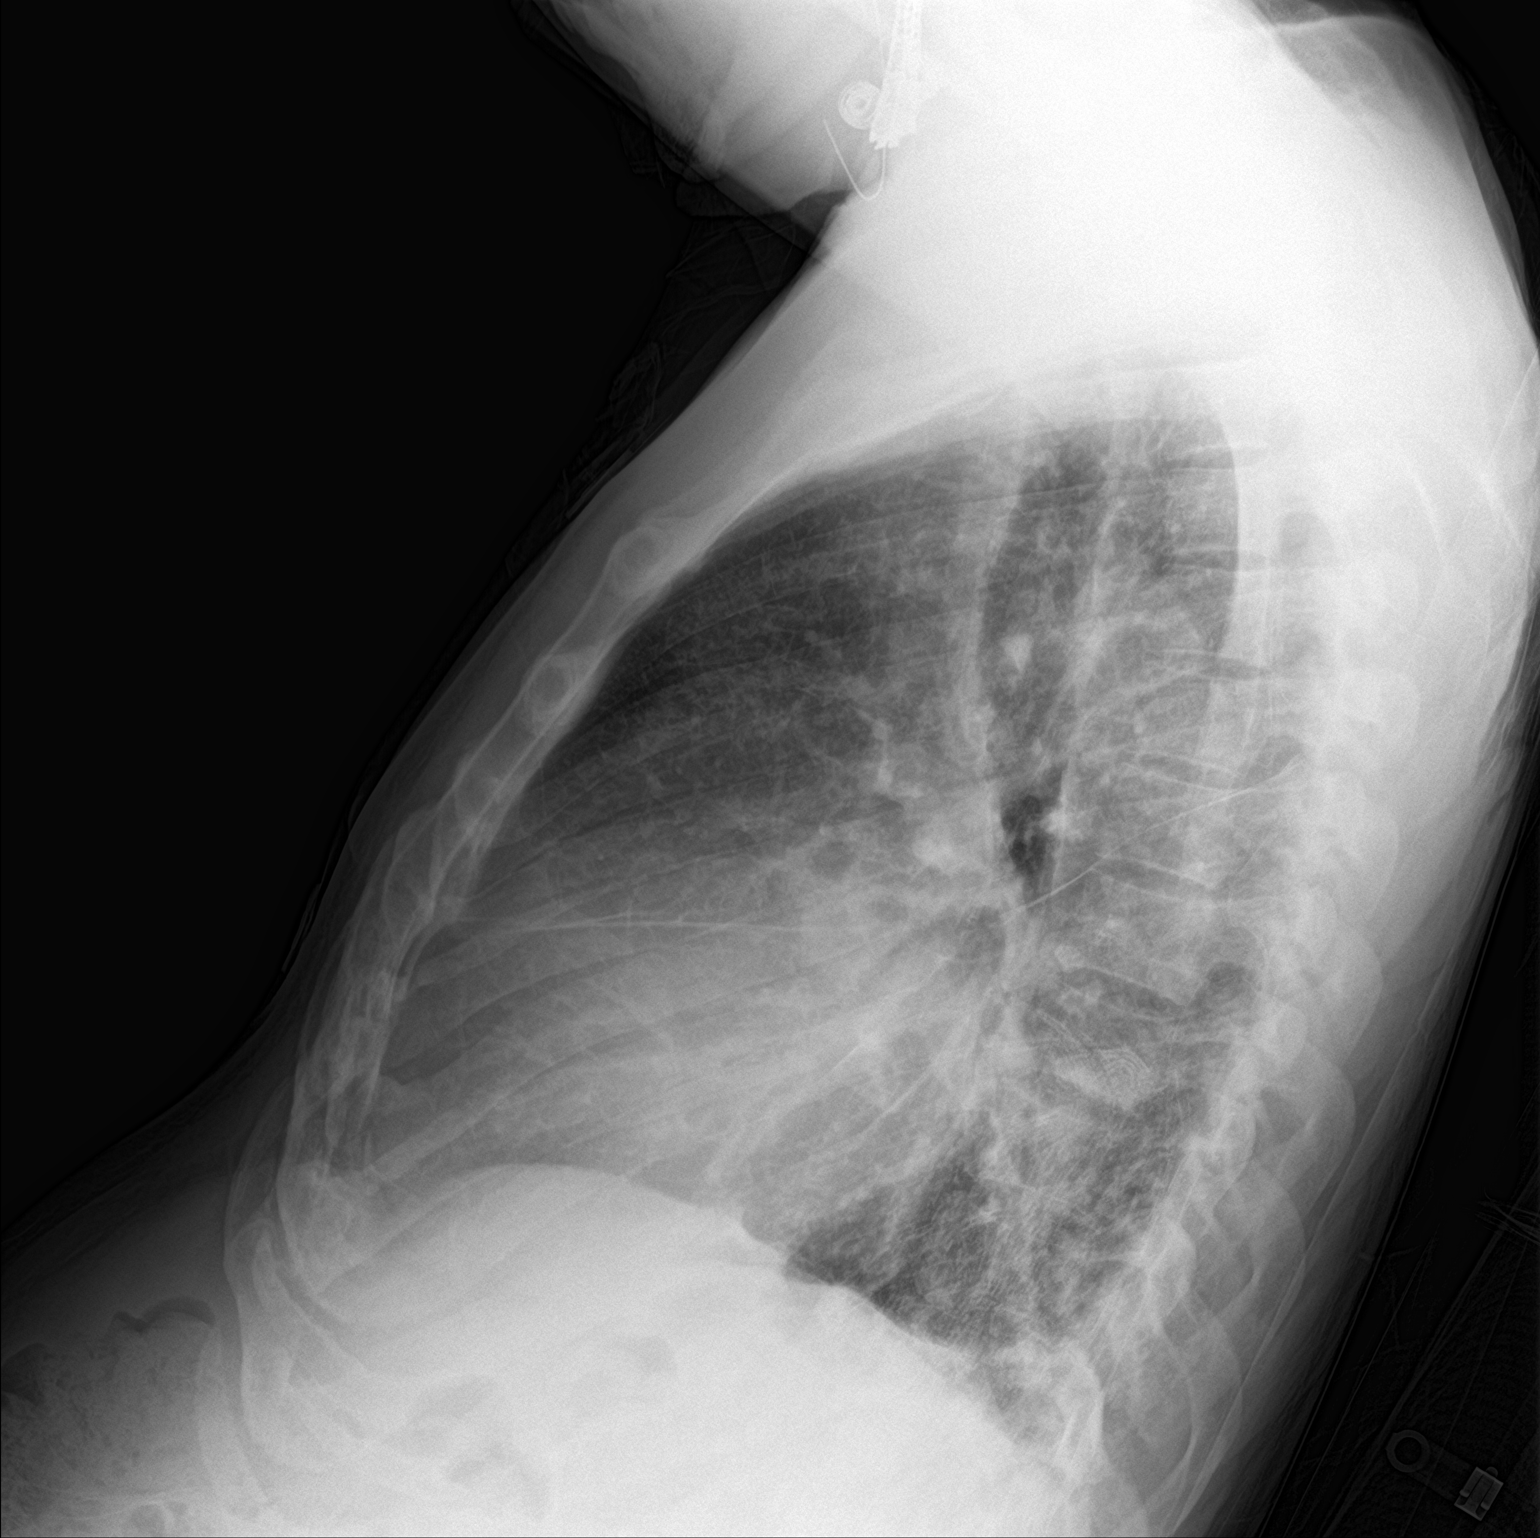

[chest ap]
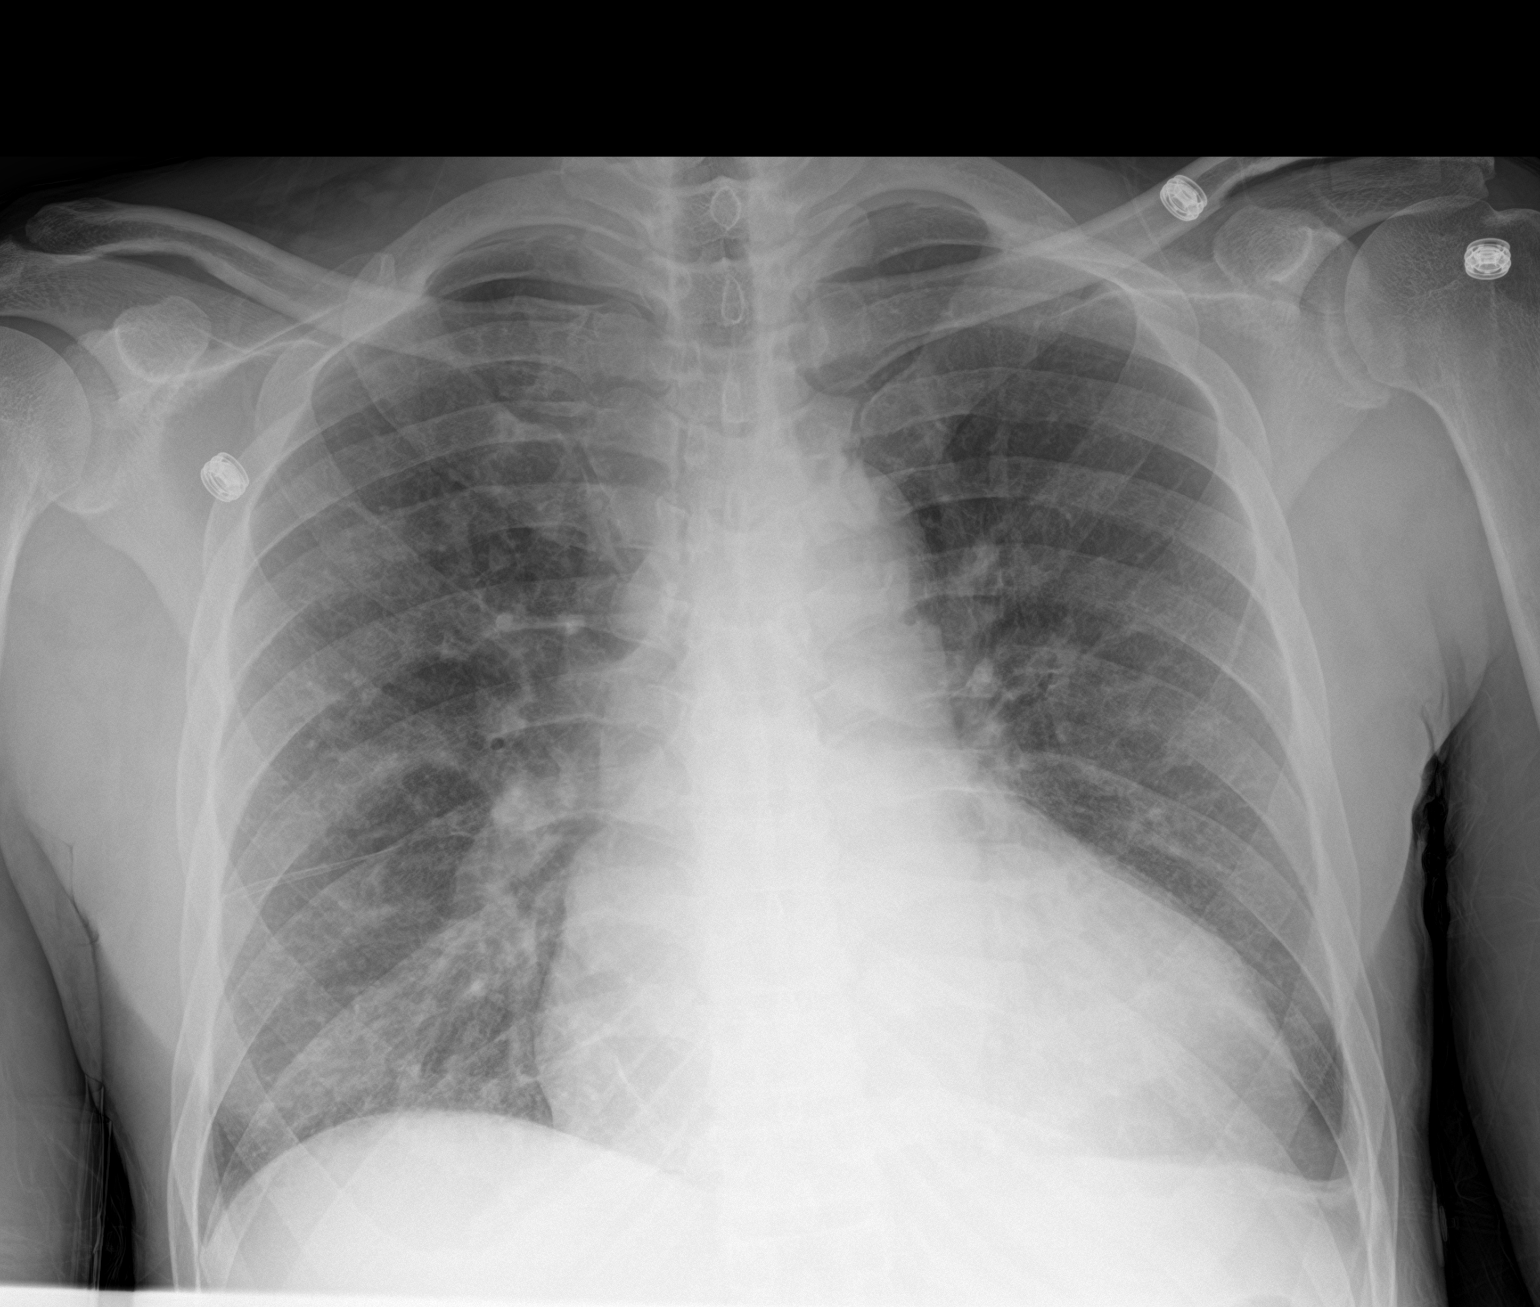

[2 of 2 positions shown; findings below may reference images not displayed]

FINDINGS: The heart is mildly enlarged. There is mild prominence of
interstitial markings, consistent with mild interstitial pulmonary
edema. Suspect small left pleural effusion. No focal consolidations.
IMPRESSION: Cardiomegaly and mild interstitial edema.

## 2019-03-06 DIAGNOSIS — N186 End stage renal disease: Secondary | ICD-10-CM | POA: Diagnosis not present

## 2019-03-06 DIAGNOSIS — N2581 Secondary hyperparathyroidism of renal origin: Secondary | ICD-10-CM | POA: Diagnosis not present

## 2019-03-06 DIAGNOSIS — E875 Hyperkalemia: Secondary | ICD-10-CM | POA: Diagnosis not present

## 2019-03-06 DIAGNOSIS — D631 Anemia in chronic kidney disease: Secondary | ICD-10-CM | POA: Diagnosis not present

## 2019-03-06 DIAGNOSIS — Z992 Dependence on renal dialysis: Secondary | ICD-10-CM | POA: Diagnosis not present

## 2019-03-06 DIAGNOSIS — D509 Iron deficiency anemia, unspecified: Secondary | ICD-10-CM | POA: Diagnosis not present

## 2019-03-09 ENCOUNTER — Encounter (HOSPITAL_BASED_OUTPATIENT_CLINIC_OR_DEPARTMENT_OTHER): Payer: Medicare Other

## 2019-03-09 DIAGNOSIS — N2581 Secondary hyperparathyroidism of renal origin: Secondary | ICD-10-CM | POA: Diagnosis not present

## 2019-03-09 DIAGNOSIS — N186 End stage renal disease: Secondary | ICD-10-CM | POA: Diagnosis not present

## 2019-03-09 DIAGNOSIS — D509 Iron deficiency anemia, unspecified: Secondary | ICD-10-CM | POA: Diagnosis not present

## 2019-03-09 DIAGNOSIS — D631 Anemia in chronic kidney disease: Secondary | ICD-10-CM | POA: Diagnosis not present

## 2019-03-09 DIAGNOSIS — E875 Hyperkalemia: Secondary | ICD-10-CM | POA: Diagnosis not present

## 2019-03-09 DIAGNOSIS — Z992 Dependence on renal dialysis: Secondary | ICD-10-CM | POA: Diagnosis not present

## 2019-03-09 DIAGNOSIS — Z79899 Other long term (current) drug therapy: Secondary | ICD-10-CM | POA: Diagnosis not present

## 2019-03-10 DIAGNOSIS — K219 Gastro-esophageal reflux disease without esophagitis: Secondary | ICD-10-CM | POA: Diagnosis not present

## 2019-03-10 DIAGNOSIS — Z1159 Encounter for screening for other viral diseases: Secondary | ICD-10-CM | POA: Diagnosis not present

## 2019-03-10 DIAGNOSIS — Z86718 Personal history of other venous thrombosis and embolism: Secondary | ICD-10-CM | POA: Diagnosis not present

## 2019-03-10 DIAGNOSIS — E785 Hyperlipidemia, unspecified: Secondary | ICD-10-CM | POA: Diagnosis not present

## 2019-03-10 DIAGNOSIS — Z79899 Other long term (current) drug therapy: Secondary | ICD-10-CM | POA: Diagnosis not present

## 2019-03-10 DIAGNOSIS — N186 End stage renal disease: Secondary | ICD-10-CM | POA: Insufficient documentation

## 2019-03-10 DIAGNOSIS — Z791 Long term (current) use of non-steroidal anti-inflammatories (NSAID): Secondary | ICD-10-CM | POA: Insufficient documentation

## 2019-03-10 DIAGNOSIS — E1122 Type 2 diabetes mellitus with diabetic chronic kidney disease: Secondary | ICD-10-CM | POA: Diagnosis not present

## 2019-03-10 DIAGNOSIS — Z992 Dependence on renal dialysis: Secondary | ICD-10-CM | POA: Insufficient documentation

## 2019-03-10 DIAGNOSIS — R0602 Shortness of breath: Secondary | ICD-10-CM | POA: Diagnosis not present

## 2019-03-10 DIAGNOSIS — D631 Anemia in chronic kidney disease: Secondary | ICD-10-CM | POA: Diagnosis not present

## 2019-03-10 DIAGNOSIS — Z886 Allergy status to analgesic agent status: Secondary | ICD-10-CM | POA: Insufficient documentation

## 2019-03-10 DIAGNOSIS — I132 Hypertensive heart and chronic kidney disease with heart failure and with stage 5 chronic kidney disease, or end stage renal disease: Principal | ICD-10-CM | POA: Insufficient documentation

## 2019-03-10 DIAGNOSIS — Z20828 Contact with and (suspected) exposure to other viral communicable diseases: Secondary | ICD-10-CM | POA: Diagnosis not present

## 2019-03-10 DIAGNOSIS — I5022 Chronic systolic (congestive) heart failure: Secondary | ICD-10-CM | POA: Insufficient documentation

## 2019-03-10 DIAGNOSIS — Z7901 Long term (current) use of anticoagulants: Secondary | ICD-10-CM | POA: Diagnosis not present

## 2019-03-11 ENCOUNTER — Encounter (HOSPITAL_COMMUNITY): Payer: Self-pay | Admitting: Emergency Medicine

## 2019-03-11 ENCOUNTER — Other Ambulatory Visit: Payer: Self-pay

## 2019-03-11 ENCOUNTER — Emergency Department (HOSPITAL_COMMUNITY): Payer: Medicare Other

## 2019-03-11 ENCOUNTER — Observation Stay (HOSPITAL_COMMUNITY)
Admission: EM | Admit: 2019-03-11 | Discharge: 2019-03-12 | Disposition: A | Discharge status: Home / Self Care | Payer: Medicare Other | Attending: Family Medicine | Admitting: Family Medicine

## 2019-03-11 DIAGNOSIS — Z791 Long term (current) use of non-steroidal anti-inflammatories (NSAID): Secondary | ICD-10-CM | POA: Diagnosis not present

## 2019-03-11 DIAGNOSIS — E8770 Fluid overload, unspecified: Secondary | ICD-10-CM | POA: Diagnosis not present

## 2019-03-11 DIAGNOSIS — K219 Gastro-esophageal reflux disease without esophagitis: Secondary | ICD-10-CM | POA: Diagnosis not present

## 2019-03-11 DIAGNOSIS — Z886 Allergy status to analgesic agent status: Secondary | ICD-10-CM | POA: Diagnosis not present

## 2019-03-11 DIAGNOSIS — E1122 Type 2 diabetes mellitus with diabetic chronic kidney disease: Secondary | ICD-10-CM | POA: Diagnosis not present

## 2019-03-11 DIAGNOSIS — N189 Chronic kidney disease, unspecified: Secondary | ICD-10-CM | POA: Diagnosis present

## 2019-03-11 DIAGNOSIS — N186 End stage renal disease: Secondary | ICD-10-CM | POA: Diagnosis not present

## 2019-03-11 DIAGNOSIS — Z1159 Encounter for screening for other viral diseases: Secondary | ICD-10-CM | POA: Diagnosis not present

## 2019-03-11 DIAGNOSIS — I5022 Chronic systolic (congestive) heart failure: Secondary | ICD-10-CM | POA: Diagnosis present

## 2019-03-11 DIAGNOSIS — I10 Essential (primary) hypertension: Secondary | ICD-10-CM | POA: Diagnosis present

## 2019-03-11 DIAGNOSIS — Z86718 Personal history of other venous thrombosis and embolism: Secondary | ICD-10-CM | POA: Diagnosis not present

## 2019-03-11 DIAGNOSIS — E1159 Type 2 diabetes mellitus with other circulatory complications: Secondary | ICD-10-CM | POA: Diagnosis present

## 2019-03-11 DIAGNOSIS — D631 Anemia in chronic kidney disease: Secondary | ICD-10-CM | POA: Diagnosis not present

## 2019-03-11 DIAGNOSIS — I132 Hypertensive heart and chronic kidney disease with heart failure and with stage 5 chronic kidney disease, or end stage renal disease: Secondary | ICD-10-CM | POA: Diagnosis not present

## 2019-03-11 DIAGNOSIS — Z992 Dependence on renal dialysis: Secondary | ICD-10-CM | POA: Diagnosis not present

## 2019-03-11 DIAGNOSIS — IMO0002 Reserved for concepts with insufficient information to code with codable children: Secondary | ICD-10-CM | POA: Diagnosis present

## 2019-03-11 DIAGNOSIS — Z79899 Other long term (current) drug therapy: Secondary | ICD-10-CM | POA: Diagnosis not present

## 2019-03-11 DIAGNOSIS — E1129 Type 2 diabetes mellitus with other diabetic kidney complication: Secondary | ICD-10-CM | POA: Diagnosis present

## 2019-03-11 DIAGNOSIS — E785 Hyperlipidemia, unspecified: Secondary | ICD-10-CM | POA: Diagnosis present

## 2019-03-11 DIAGNOSIS — I5023 Acute on chronic systolic (congestive) heart failure: Secondary | ICD-10-CM | POA: Diagnosis present

## 2019-03-11 DIAGNOSIS — J90 Pleural effusion, not elsewhere classified: Secondary | ICD-10-CM

## 2019-03-11 DIAGNOSIS — Z7901 Long term (current) use of anticoagulants: Secondary | ICD-10-CM | POA: Diagnosis not present

## 2019-03-11 DIAGNOSIS — R0602 Shortness of breath: Secondary | ICD-10-CM | POA: Diagnosis not present

## 2019-03-11 DIAGNOSIS — R0600 Dyspnea, unspecified: Secondary | ICD-10-CM

## 2019-03-11 LAB — CBC WITH DIFFERENTIAL/PLATELET
Abs Immature Granulocytes: 0.03 10*3/uL (ref 0.00–0.07)
Basophils Absolute: 0 10*3/uL (ref 0.0–0.1)
Basophils Relative: 0 %
Eosinophils Absolute: 0.1 10*3/uL (ref 0.0–0.5)
Eosinophils Relative: 2 %
HCT: 41.3 % (ref 39.0–52.0)
Hemoglobin: 12.8 g/dL — ABNORMAL LOW (ref 13.0–17.0)
Immature Granulocytes: 1 %
Lymphocytes Relative: 4 %
Lymphs Abs: 0.3 10*3/uL — ABNORMAL LOW (ref 0.7–4.0)
MCH: 27.8 pg (ref 26.0–34.0)
MCHC: 31 g/dL (ref 30.0–36.0)
MCV: 89.8 fL (ref 80.0–100.0)
Monocytes Absolute: 0.2 10*3/uL (ref 0.1–1.0)
Monocytes Relative: 4 %
Neutro Abs: 5.8 10*3/uL (ref 1.7–7.7)
Neutrophils Relative %: 89 %
Platelets: 120 10*3/uL — ABNORMAL LOW (ref 150–400)
RBC: 4.6 MIL/uL (ref 4.22–5.81)
RDW: 17.1 % — ABNORMAL HIGH (ref 11.5–15.5)
WBC: 6.5 10*3/uL (ref 4.0–10.5)
nRBC: 0 % (ref 0.0–0.2)

## 2019-03-11 LAB — BASIC METABOLIC PANEL
Anion gap: 18 — ABNORMAL HIGH (ref 5–15)
BUN: 64 mg/dL — ABNORMAL HIGH (ref 6–20)
CO2: 23 mmol/L (ref 22–32)
Calcium: 6.9 mg/dL — ABNORMAL LOW (ref 8.9–10.3)
Chloride: 96 mmol/L — ABNORMAL LOW (ref 98–111)
Creatinine, Ser: 10.8 mg/dL — ABNORMAL HIGH (ref 0.61–1.24)
GFR calc Af Amer: 6 mL/min — ABNORMAL LOW (ref >60)
GFR calc non Af Amer: 5 mL/min — ABNORMAL LOW (ref >60)
Glucose, Bld: 117 mg/dL — ABNORMAL HIGH (ref 70–99)
Potassium: 4.6 mmol/L (ref 3.5–5.1)
Sodium: 137 mmol/L (ref 135–145)

## 2019-03-11 LAB — HEPATIC FUNCTION PANEL
ALT: 10 U/L (ref 0–44)
AST: 18 U/L (ref 15–41)
Albumin: 3.7 g/dL (ref 3.5–5.0)
Alkaline Phosphatase: 144 U/L — ABNORMAL HIGH (ref 38–126)
Bilirubin, Direct: 0.2 mg/dL (ref 0.0–0.2)
Indirect Bilirubin: 0.5 mg/dL (ref 0.3–0.9)
Total Bilirubin: 0.7 mg/dL (ref 0.3–1.2)
Total Protein: 8.7 g/dL — ABNORMAL HIGH (ref 6.5–8.1)

## 2019-03-11 LAB — SARS CORONAVIRUS 2 BY RT PCR (HOSPITAL ORDER, PERFORMED IN ~~LOC~~ HOSPITAL LAB): SARS Coronavirus 2: NEGATIVE

## 2019-03-11 LAB — GLUCOSE, CAPILLARY: Glucose-Capillary: 98 mg/dL (ref 70–99)

## 2019-03-11 IMAGING — CR DG CHEST 2V
2 series · 2 of 2 positions shown · non-contrast
Comparison: Chest radiograph October 13, 2016

CLINICAL DATA: Abdominal pain, chest pain, shortness of breath and
vomiting. History of end-stage renal disease on dialysis, CHF.

EXAM:
CHEST  2 VIEW

[chest pa]
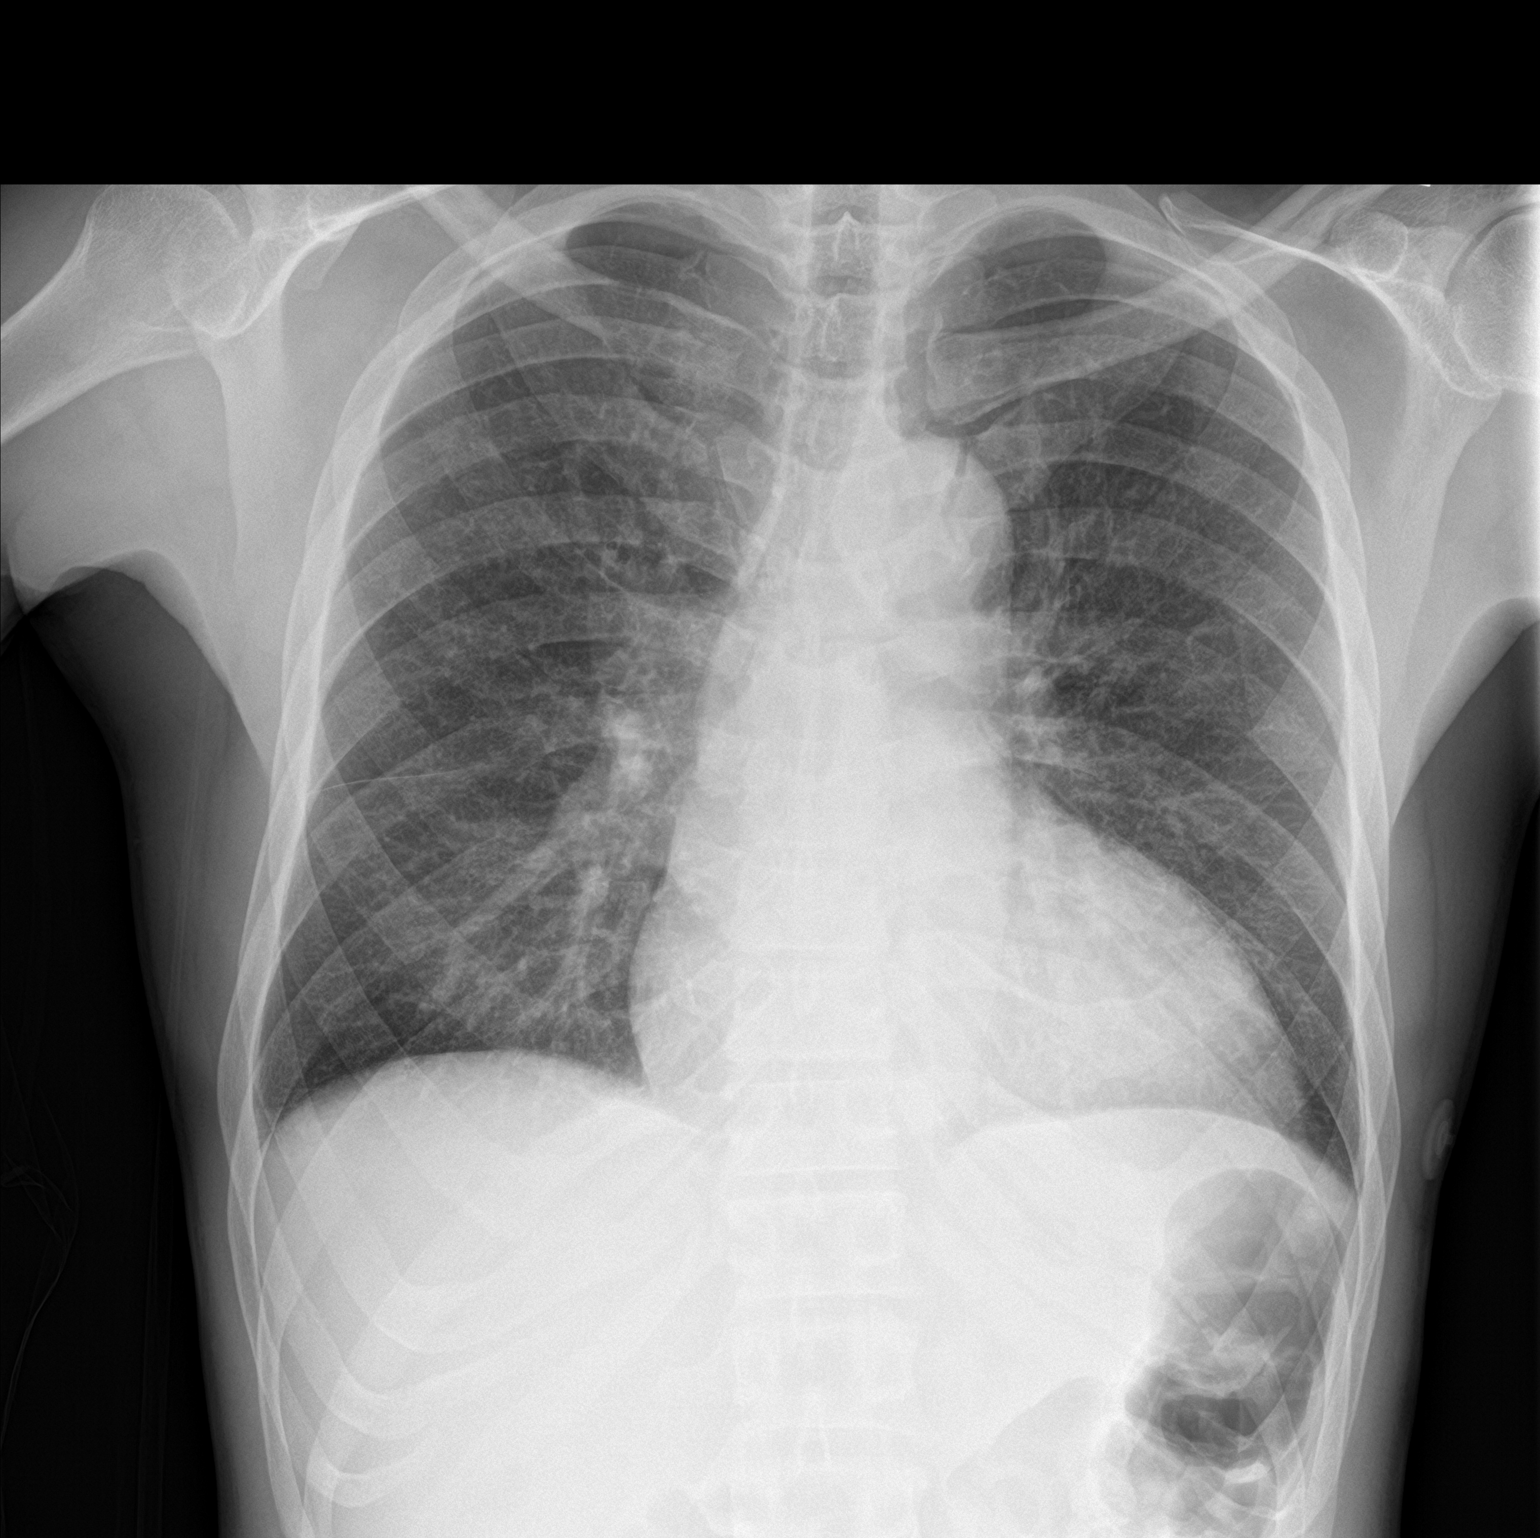

[chest lat]
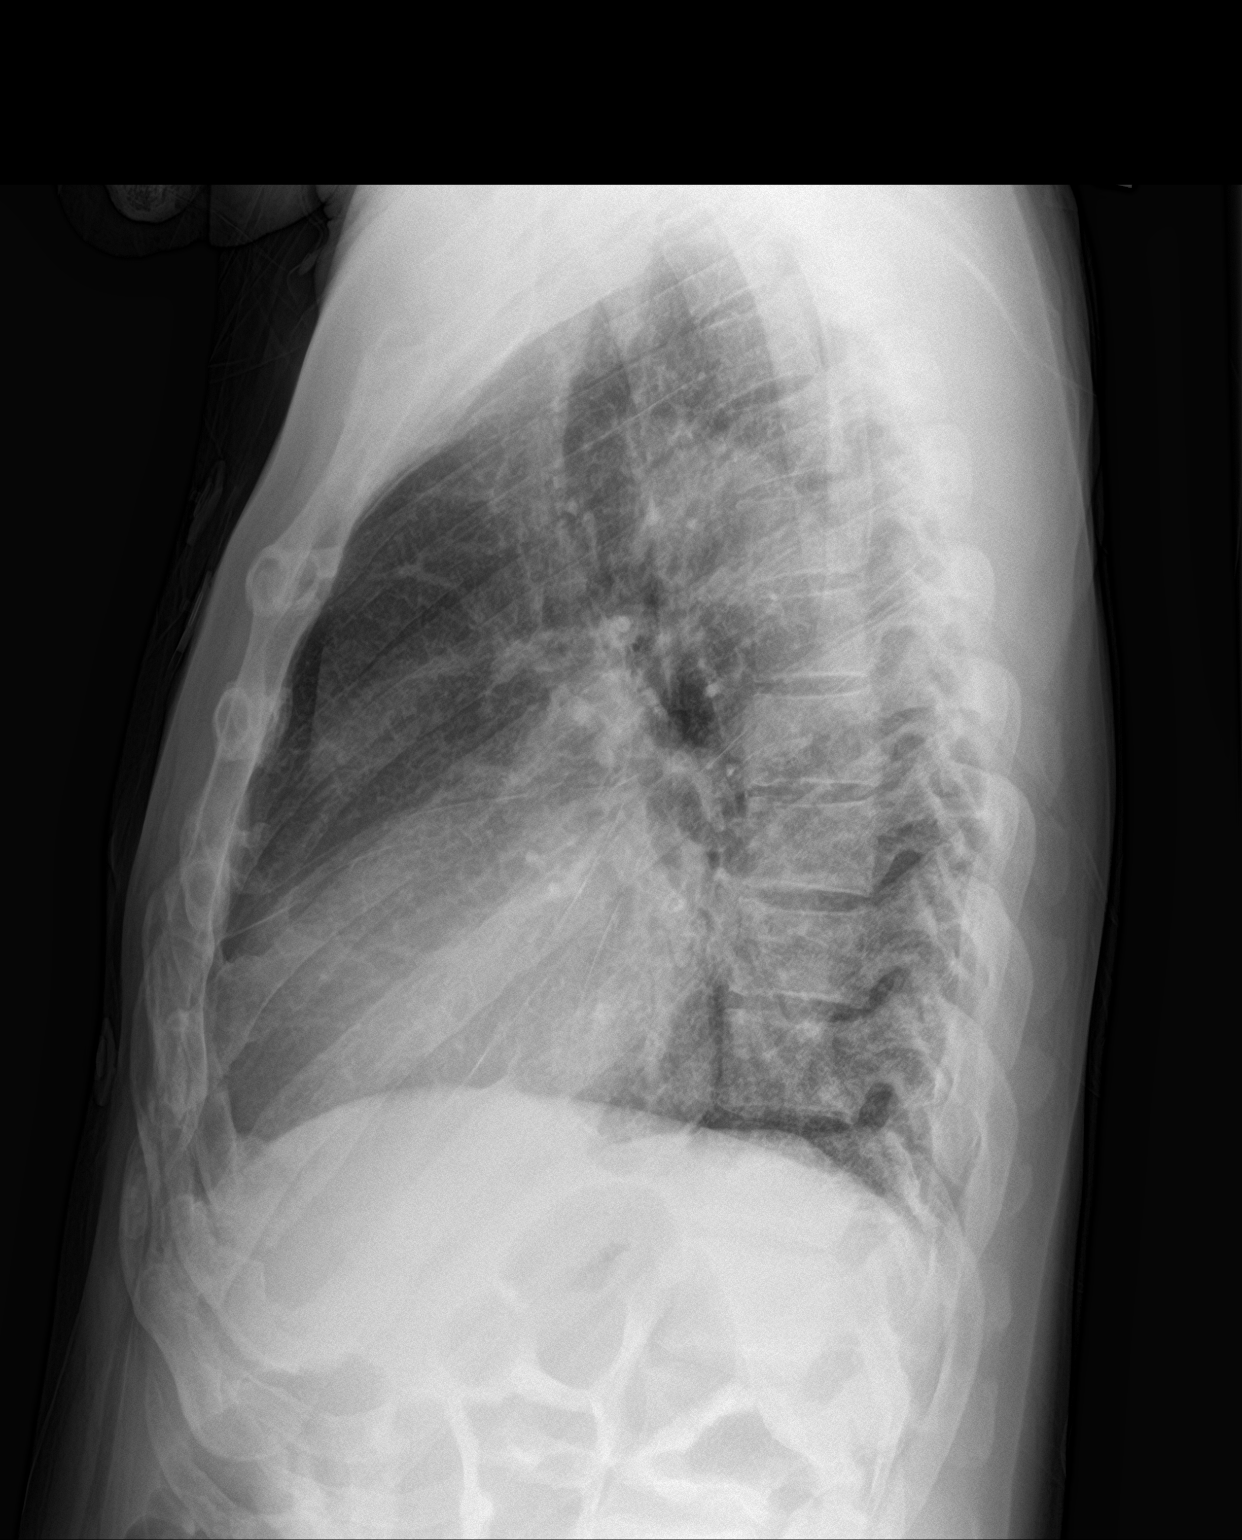

[2 of 2 positions shown; findings below may reference images not displayed]

FINDINGS: Cardiac silhouette is moderately enlarged and unchanged. Mediastinal
silhouette is nonsuspicious, trace calcific atherosclerosis of the
aortic arch. Similar mild interstitial prominence without pleural
effusion or focal consolidation. No pneumothorax. Soft tissue planes
and included osseous structures are unchanged.
IMPRESSION: Stable cardiomegaly and mild interstitial prominence favoring
pulmonary edema.

## 2019-03-11 MED ORDER — AMLODIPINE BESYLATE 5 MG PO TABS
5.0000 mg | ORAL_TABLET | Freq: Every day | ORAL | Status: DC
  Administered 2019-03-12: 5 mg via ORAL
  Filled 2019-03-11: qty 1

## 2019-03-11 MED ORDER — HYDRALAZINE HCL 50 MG PO TABS
100.0000 mg | ORAL_TABLET | Freq: Three times a day (TID) | ORAL | Status: DC
  Administered 2019-03-11 – 2019-03-12 (×2): 100 mg via ORAL
  Filled 2019-03-11 (×2): qty 2

## 2019-03-11 MED ORDER — APIXABAN 5 MG PO TABS
5.0000 mg | ORAL_TABLET | Freq: Two times a day (BID) | ORAL | Status: DC
  Administered 2019-03-11 – 2019-03-12 (×2): 5 mg via ORAL
  Filled 2019-03-11 (×2): qty 1

## 2019-03-11 MED ORDER — OXYCODONE HCL 10 MG PO TABS: 10.0000 mg | ORAL_TABLET | ORAL | Status: DC

## 2019-03-11 MED ORDER — PANTOPRAZOLE SODIUM 40 MG PO TBEC
40.0000 mg | DELAYED_RELEASE_TABLET | Freq: Every day | ORAL | Status: DC
  Administered 2019-03-11 – 2019-03-12 (×2): 40 mg via ORAL
  Filled 2019-03-11 (×2): qty 1

## 2019-03-11 MED ORDER — SODIUM THIOSULFATE 25 % IV SOLN: 25.0000 g | INTRAVENOUS | Status: DC

## 2019-03-11 MED ORDER — DIPHENHYDRAMINE HCL 25 MG PO CAPS: 25.0000 mg | ORAL_CAPSULE | Freq: Three times a day (TID) | ORAL | Status: DC | PRN

## 2019-03-11 MED ORDER — LISINOPRIL 5 MG PO TABS
5.0000 mg | ORAL_TABLET | Freq: Every day | ORAL | Status: DC
  Administered 2019-03-12: 5 mg via ORAL
  Filled 2019-03-11: qty 1

## 2019-03-11 MED ORDER — CHLORHEXIDINE GLUCONATE CLOTH 2 % EX PADS: 6.0000 | MEDICATED_PAD | Freq: Every day | CUTANEOUS | Status: DC

## 2019-03-11 MED ORDER — ALBUTEROL SULFATE HFA 108 (90 BASE) MCG/ACT IN AERS
4.0000 | INHALATION_SPRAY | Freq: Once | RESPIRATORY_TRACT | Status: AC
  Administered 2019-03-11: 4 via RESPIRATORY_TRACT
  Filled 2019-03-11: qty 6.7

## 2019-03-11 MED ORDER — NITROGLYCERIN 2 % TD OINT
1.0000 [in_us] | TOPICAL_OINTMENT | Freq: Four times a day (QID) | TRANSDERMAL | Status: DC
  Filled 2019-03-11: qty 30

## 2019-03-11 MED ORDER — SENNOSIDES-DOCUSATE SODIUM 8.6-50 MG PO TABS
2.0000 | ORAL_TABLET | Freq: Every day | ORAL | Status: DC
  Administered 2019-03-11: 2 via ORAL
  Filled 2019-03-11: qty 2

## 2019-03-11 MED ORDER — ENOXAPARIN SODIUM 30 MG/0.3ML ~~LOC~~ SOLN: 30.0000 mg | SUBCUTANEOUS | Status: DC

## 2019-03-11 NOTE — Progress Notes (Signed)
53 yo M admitted for dyspnea. See H&P for full details. To be transferred to [facility] for HD. Accepting physician notified/updated. Continue as per H&P at this time. Stable for transfer.   General: 53 y.o. male resting in bed in NAD Cardiovascular: RRR, +S1, S2, no m/g/r, equal pulses throughout Respiratory: mild accessory muscle use w/ decreased sounds at bases GI: BS+, NDNT, no masses noted, no organomegaly noted MSK: No e/c/c Skin: No rashes, bruises, ulcerations noted Neuro: A&O x 3, no focal deficits  .Jonnie Finner, DO

## 2019-03-11 NOTE — ED Notes (Signed)
Staff attempted to start IV access on pt. Pt stated he is left arm restricted due to dialysis and recently had "A clot last time they tried an IV and is on antibiotics now and you cant use it". Pt is also a diabetic and cannot have an IV in the foot. IV team consulted.

## 2019-03-11 NOTE — ED Notes (Signed)
Pt upset that staff will not give him something to drink. Verbal from PA to keep pt NPO for right now

## 2019-03-11 NOTE — ED Notes (Signed)
*  DOWNTIME START*

## 2019-03-11 NOTE — ED Triage Notes (Signed)
Patient is complaining of sob. Patient is on dialysis. Patient last dialysis was Tuesday. Patient states he has felt sob since Tuesday.

## 2019-03-11 NOTE — Progress Notes (Signed)
Patient seen and examined at bedside, patient admitted after midnight, please see earlier detailed admission note by Reubin Milan, MD. Briefly, patient presented with shortness of breath with concern for volume overload. Plan for HD today   Cordelia Poche, MD [facility] 03/11/2019, 3:02 PM

## 2019-03-11 NOTE — ED Notes (Signed)
[facility] contacted and paperwork printed

## 2019-03-11 NOTE — ED Notes (Addendum)
Pt. Documented in error DG Chest 2 View.

## 2019-03-11 NOTE — ED Notes (Signed)
Admitting MD at beside.

## 2019-03-11 NOTE — ED Notes (Signed)
*  DOWNTIME COMPLETE. PLEASE REFER TO DOWNTIME FORMS*
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
## 2019-03-11 NOTE — ED Notes (Signed)
RN attempted to covid swab on pt. RN explained procedure. Pt adamantly refusing stating "I am just here for a breathing treatment. I am not going to do that" PA made aware.

## 2019-03-11 NOTE — Plan of Care (Signed)
  Problem: Education: Goal: Knowledge of General Education information will improve Description Including pain rating scale, medication(s)/side effects and non-pharmacologic comfort measures Outcome: Progressing   Problem: Health Behavior/Discharge Planning: Goal: Ability to manage health-related needs will improve Outcome: Progressing   Problem: Clinical Measurements: Goal: Ability to maintain clinical measurements within normal limits will improve Outcome: Progressing Goal: Will remain free from infection Outcome: Progressing Goal: Diagnostic test results will improve Outcome: Progressing Goal: Respiratory complications will improve Outcome: Progressing Goal: Cardiovascular complication will be avoided Outcome: Progressing   Problem: Activity: Goal: Risk for activity intolerance will decrease Outcome: Progressing   Problem: Nutrition: Goal: Adequate nutrition will be maintained Outcome: Progressing   

## 2019-03-11 NOTE — Progress Notes (Signed)
Pt refused IV start. Judson Roch, RN notified.
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
## 2019-03-12 ENCOUNTER — Observation Stay (HOSPITAL_COMMUNITY): Payer: Medicare Other

## 2019-03-12 DIAGNOSIS — E8770 Fluid overload, unspecified: Secondary | ICD-10-CM | POA: Diagnosis not present

## 2019-03-12 DIAGNOSIS — I10 Essential (primary) hypertension: Secondary | ICD-10-CM

## 2019-03-12 DIAGNOSIS — R0602 Shortness of breath: Secondary | ICD-10-CM | POA: Diagnosis not present

## 2019-03-12 DIAGNOSIS — I5022 Chronic systolic (congestive) heart failure: Secondary | ICD-10-CM | POA: Diagnosis not present

## 2019-03-12 MED ORDER — OXYCODONE HCL 5 MG PO TABS: 10.0000 mg | ORAL_TABLET | Freq: Four times a day (QID) | ORAL | Status: DC | PRN

## 2019-03-12 MED ORDER — OXYCODONE HCL 5 MG PO TABS
5.0000 mg | ORAL_TABLET | ORAL | Status: DC | PRN
  Administered 2019-03-12: 5 mg via ORAL
  Filled 2019-03-12 (×2): qty 1

## 2019-03-12 MED ORDER — AZITHROMYCIN 250 MG PO TABS: ORAL_TABLET | ORAL | 0 refills | Status: AC

## 2019-03-12 MED ORDER — GUAIFENESIN ER 600 MG PO TB12
1200.0000 mg | ORAL_TABLET | Freq: Two times a day (BID) | ORAL | Status: DC
  Administered 2019-03-12: 1200 mg via ORAL
  Filled 2019-03-12: qty 2

## 2019-03-12 MED ORDER — OXYCODONE HCL 5 MG PO TABS: 15.0000 mg | ORAL_TABLET | Freq: Four times a day (QID) | ORAL | Status: DC | PRN

## 2019-03-12 MED ORDER — CHLORHEXIDINE GLUCONATE CLOTH 2 % EX PADS: 6.0000 | MEDICATED_PAD | Freq: Every day | CUTANEOUS | Status: DC

## 2019-03-12 NOTE — Progress Notes (Signed)
Pt pulled off Cardiac monitoring leads and refused to be placed back on. Pt also refused to have another IV inserted after taking the previous one out. MD made aware of refusal. Educated on importance of cardiac monitoring and having venous access. Will continue to monitor

## 2019-03-12 NOTE — Discharge Instructions (Signed)
Frank [name],  You were in the hospital because of trouble breathing. You were given hemodialysis with improvement. I am also prescribing you an antibiotic to take for a few days.

## 2019-03-12 NOTE — Progress Notes (Signed)
IR requested by Dr. Lonny Prude for possible image-guided right thoracentesis.  Limited chest US revealed no fluid bilaterally that could be safely accessed with procedure today. Images sent to Dr. Kathlene Cote for review. Informed patient that procedure will not occur today. All questions answered and concerns addressed. Dr. Lonny Prude made aware.  IR available in future if needed.   Bea Graff Berl Bonfanti, PA-C 03/12/2019, 11:20 AM

## 2019-03-12 NOTE — Discharge Summary (Signed)
Physician Discharge Summary  Frank [name] IDH:[id number] DOB: 06-07-1966 DOA: 03/11/2019  PCP: Patient, [name]  Admit date: 03/11/2019 Discharge date: 03/12/2019  Admitted From: Home Disposition: Home  Recommendations for Outpatient Follow-up:  1. Follow up with PCP in 1 week 2. Please obtain BMP/CBC in one week 3. Please follow up on the following pending results: None  Home Health: None Equipment/Devices: None  Discharge Condition: Stable CODE STATUS: Full code Diet recommendation: renal diet/carb modified   Brief/Interim Summary:  Admission HPI written by Reubin Milan, MD   Chief Complaint: Shortness of breath.  HPI: Frank [name] is a 53 y.o. male with medical history significant of ESRD on hemodialysis, GERD, DVT, type 2 diabetes, hypertension, who is coming to the emergency department due to progressively worse shortness of breath since yesterday associated with wheezing, dry cough which is occasionally productive of whitish phlegm, but no fever, chills or night sweats.  His last hemodialysis was this past Tuesday per patient.  He denies headache, sore throat, chest pain, palpitations, diaphoresis, PND, or pitting edema of the lower extremities.  No abdominal pain, recent nausea or vomiting, diarrhea, constipation, melena or hematochezia.  He has been taking his furosemide as prescribed.  Denies dysuria, frequency or hematuria.  No polyuria, polydipsia, polyphagia or blurred vision.  ED Course: The mid 43s.Initial vital signs temperature 97.6 F, pulse 73, respiration 15, blood pressure 180/108 mmHg and O2 sat 87% on room air.  After the patient received nasal cannula oxygen his O2 sat improved to the mid 90s.  His COVID test was negative.  Chest radiograph shows increased vascular congestion.  White count 6.5, hemoglobin 12.8 g/dL and platelets 120.  Glucose 117, BUN 64, creatinine 10.8 and calcium 6.9 mg/dL.  I added LFTs to correct calcium to albumin level.    Hospital course:  Volume overload ESRD on HD Chest imaging suggestive of edema vs atypical infection. Patient received HD with improvement of hypoxia and dyspnea.  Acute respiratory failure with hypoxia Secondary to above. Resolved with HD.   Abnormal x-ray COVID-19 negative. Likely secondary to volume overload. Patient with reports of cough with sputum production. Started on azithromycin on discharge to cover atypical bacterial infection. Recommend repeat chest x-ray in 2-3 weeks  Right lower chest pain Reproducible. Patient with a history of loculated right pleural effusion requiring VATS in addition to treatment of right lung pneumonia. Ultrasound thoracentesis attempted without enough fluid for thoracentesis. Upon discussion with patient, pain is actually overall improved over the last few weeks and he follows with his primary care for this. Continue home pain regimen and outpatient follow-up with PCP.  Acute on chronic systolic heart failure Management with HD. Resolved prior to discharge. Continue Lasix.  Discharge Diagnoses:  Principal Problem:   Volume overload Active Problems:   Anemia of chronic kidney failure   DM (diabetes mellitus), type 2, uncontrolled, with renal complications (HCC)   Dyslipidemia   Essential hypertension   Chronic systolic heart failure (HCC)   End stage renal disease on dialysis [facility])    Discharge Instructions  Discharge Instructions    Call MD for:  difficulty breathing, headache or visual disturbances   Complete by: As directed    Call MD for:  severe uncontrolled pain   Complete by: As directed    Diet - low sodium heart healthy   Complete by: As directed    Increase activity slowly   Complete by: As directed      Allergies as  of 03/12/2019      Reactions   Butalbital-apap-caffeine Shortness Of Breath, Swelling, Other (See Comments)   Swelling in throat   Ferrlecit [na Ferric Gluc Cplx In Sucrose] Shortness Of Breath, Swelling,  Other (See Comments)   Swelling in throat, tolerates Venofor   Minoxidil Shortness Of Breath   Tylenol [acetaminophen] Anaphylaxis, Swelling   Darvocet [propoxyphene N-acetaminophen] Hives      Medication List    STOP taking these medications   pantoprazole 40 MG tablet Commonly known as: PROTONIX   promethazine 25 MG suppository Commonly known as: PHENERGAN   promethazine 25 MG tablet Commonly known as: PHENERGAN   zolpidem 5 MG tablet Commonly known as: AMBIEN     TAKE these medications   amLODipine 5 MG tablet Commonly known as: NORVASC Take 1 tablet (5 mg total) by mouth daily.   apixaban 5 MG Tabs tablet Commonly known as: Eliquis Take 1 tablet (5 mg total) by mouth 2 (two) times daily.   azithromycin 250 MG tablet Commonly known as: Zithromax Take 2 tablets (500 mg total) by mouth daily for 1 day, THEN 1 tablet (250 mg total) daily for 4 days. Take 1 tablet daily for 3 days.. Start taking on: March 12, 2019   busPIRone 5 MG tablet Commonly known as: BUSPAR Take 1 tablet (5 mg total) by mouth 2 (two) times daily.   cinacalcet 90 MG tablet Commonly known as: SENSIPAR Take 1 tablet (90 mg total) by mouth every evening.   cyclobenzaprine 10 MG tablet Commonly known as: FLEXERIL Take 10 mg by mouth 3 (three) times daily.   diclofenac sodium 1 % Gel Commonly known as: VOLTAREN Apply 4 g topically 4 (four) times daily. Apply 4 gram transdermaly four times daily for left shoulder pain   dicyclomine 10 MG/5ML syrup Commonly known as: BENTYL Take 5 mLs (10 mg total) by mouth 4 (four) times daily as needed.   diphenhydrAMINE 25 mg capsule Commonly known as: BENADRYL Take 25 mg by mouth every 8 (eight) hours as needed for itching.   hydrALAZINE 100 MG tablet Commonly known as: APRESOLINE Take 1 tablet (100 mg total) by mouth 3 (three) times daily.   lanthanum 1000 MG chewable tablet Commonly known as: FOSRENOL Chew 1 tablet (1,000 mg total) by mouth 3  (three) times daily with meals.   lipase/protease/amylase 12000 units Cpep capsule Commonly known as: CREON Take 1 capsule (12,000 Units total) by mouth 3 (three) times daily before meals.   lisinopril 5 MG tablet Commonly known as: ZESTRIL Take 5 mg by mouth daily.   Nephro Vitamins 0.8 MG Tabs Take 1 tablet by mouth daily.   nitroGLYCERIN 0.4 MG SL tablet Commonly known as: NITROSTAT Place 1 tablet (0.4 mg total) under the tongue every 5 (five) minutes as needed for chest pain.   omeprazole 20 MG capsule Commonly known as: PRILOSEC Take 20 mg by mouth daily.   ondansetron 4 MG tablet Commonly known as: ZOFRAN Take 4 mg by mouth 3 (three) times daily as needed.   Oxycodone HCl 10 MG Tabs Take 10 mg by mouth See admin instructions. Take 10 mg 5 times a day as needed   OXYGEN O2 via nasal cannula at 2L/min for O2 sats 90% or below as needed for shortness of breath   polyethylene glycol 17 g packet Commonly known as: MIRALAX / GLYCOLAX Take 17 g by mouth daily.   prochlorperazine 10 MG tablet Commonly known as: COMPAZINE Take 1 tablet (10 mg total) by  mouth every 6 (six) hours as needed for nausea or vomiting.   prochlorperazine 25 MG suppository Commonly known as: COMPAZINE Place 1 suppository (25 mg total) rectally every 12 (twelve) hours as needed for nausea or vomiting.   senna-docusate 8.6-50 MG tablet Commonly known as: Senokot-S Take 2 tablets by mouth at bedtime.      Follow-up Information    Lorretta Harp, MD Follow up.   Specialties: Cardiology, Radiology Contact information: [address] [place] [address] [place] [address] [phone]          Allergies  Allergen Reactions  . Butalbital-Apap-Caffeine Shortness Of Breath, Swelling and Other (See Comments)    Swelling in throat  . Ferrlecit [Na Ferric Gluc Cplx In Sucrose] Shortness Of Breath, Swelling and Other (See Comments)    Swelling in throat, tolerates Venofor  . Minoxidil  Shortness Of Breath  . Tylenol [Acetaminophen] Anaphylaxis and Swelling  . Darvocet [Propoxyphene N-Acetaminophen] Hives    Consultations:  Nephrology   Procedures/Studies: Dg Chest 2 View  Result Date: 03/11/2019 CLINICAL DATA:  Shortness of breath. EXAM: CHEST - 2 VIEW COMPARISON:  Radiograph 12/06/2018 FINDINGS: Cardiomegaly stable. Unchanged mediastinal contours. Chronic right pleural effusion and pleuroparenchymal opacity. Increased vascular congestion from prior exam. No pneumothorax or confluent airspace disease. IMPRESSION: 1. Increased vascular congestion from prior exam. 2. Unchanged cardiomegaly and chronic right pleural effusion and pleuroparenchymal opacity. Electronically Signed   By: Keith Rake M.D.   On: 03/11/2019 20:24      Subjective: Some right lower chest pain.  Discharge Exam: Vitals:   03/11/19 2303 03/12/19 0815  BP:  (!) 153/103  Pulse: 74 76  Resp: 20   Temp: 97.7 F (36.5 C)   SpO2: 98% 100%   Vitals:   03/11/19 2100 03/11/19 2130 03/11/19 2303 03/12/19 0815  BP: (!) 170/70 (!) 175/70 (!) 176/81 (!) 153/103  Pulse: 82 80 74 76  Resp: _0 Temp:  98.5 F (36.9 C) 97.7 F (36.5 C)   TempSrc:  Oral Axillary   SpO2: 100% 100% 98% 100%  Weight:  76.5 kg    Height:        General: Pt is alert, awake, not in acute distress Cardiovascular: RRR, S1/S2 +, no rubs, no gallops Chest: reproducible pain over rib Respiratory: CTA bilaterally, no wheezing, no rhonchi Abdominal: Soft, NT, ND, bowel sounds + Extremities: no edema, no cyanosis    The results of significant diagnostics from this hospitalization (including imaging, microbiology, ancillary and laboratory) are listed below for reference.     Microbiology: Recent Results (from the past 240 hour(s))  SARS Coronavirus 2 [facility] order, Performed in [facility] hospital lab) Nasopharyngeal Nasopharyngeal Swab     Status: None   Collection Time: 03/11/19  1:07 AM   Specimen:  Nasopharyngeal Swab  Result Value Ref Range Status   SARS Coronavirus 2 NEGATIVE NEGATIVE Final    Comment: (NOTE) If result is NEGATIVE SARS-CoV-2 target nucleic acids are NOT DETECTED. The SARS-CoV-2 RNA is generally detectable in upper and lower  respiratory specimens during the acute phase of infection. The lowest  concentration of SARS-CoV-2 viral copies this assay can detect is 250  copies / mL. A negative result does not preclude SARS-CoV-2 infection  and should not be used as the sole basis for treatment or other  patient management decisions.  A negative result may occur with  improper specimen collection / handling, submission of specimen other  than nasopharyngeal swab, presence of viral mutation(s)  within the  areas targeted by this assay, and inadequate number of viral copies  (<250 copies / mL). A negative result must be combined with clinical  observations, patient history, and epidemiological information. If result is POSITIVE SARS-CoV-2 target nucleic acids are DETECTED. The SARS-CoV-2 RNA is generally detectable in upper and lower  respiratory specimens dur ing the acute phase of infection.  Positive  results are indicative of active infection with SARS-CoV-2.  Clinical  correlation with patient history and other diagnostic information is  necessary to determine patient infection status.  Positive results do  not rule out bacterial infection or co-infection with other viruses. If result is PRESUMPTIVE POSTIVE SARS-CoV-2 nucleic acids MAY BE PRESENT.   A presumptive positive result was obtained on the submitted specimen  and confirmed on repeat testing.  While 2019 novel coronavirus  (SARS-CoV-2) nucleic acids may be present in the submitted sample  additional confirmatory testing may be necessary for epidemiological  and / or clinical management purposes  to differentiate between  SARS-CoV-2 and other Sarbecovirus currently known to infect humans.  If clinically  indicated additional testing with an alternate test  methodology [phone]) is advised. The SARS-CoV-2 RNA is generally  detectable in upper and lower respiratory sp ecimens during the acute  phase of infection. The expected result is Negative. Fact Sheet for Patients:  StrictlyIdeas.no Fact Sheet for Healthcare Providers: BankingDealers.co.za This test is not yet approved or cleared by the [place] FDA and has been authorized for detection and/or diagnosis of SARS-CoV-2 by FDA under an Emergency Use Authorization (EUA).  This EUA will remain in effect (meaning this test can be used) for the duration of the COVID-19 declaration under Section 564(b)(1) of the Act, 21 U.S.C. section 360bbb-3(b)(1), unless the authorization is terminated or revoked sooner. Performed at [facility], [place] [address] [place], [place] [address]      Labs: BNP (last 3 results) No results for input(s): BNP in the last 8760 hours. Basic Metabolic Panel: Recent Labs  Lab 03/11/19 0314  NA 137  K 4.6  CL 96*  CO2 23  GLUCOSE 117*  BUN 64*  CREATININE 10.80*  CALCIUM 6.9*   Liver Function Tests: Recent Labs  Lab 03/11/19 0314  AST 18  ALT 10  ALKPHOS 144*  BILITOT 0.7  PROT 8.7*  ALBUMIN 3.7   No results for input(s): LIPASE, AMYLASE in the last 168 hours. No results for input(s): AMMONIA in the last 168 hours. CBC: Recent Labs  Lab 03/11/19 0314  WBC 6.5  NEUTROABS 5.8  HGB 12.8*  HCT 41.3  MCV 89.8  PLT 120*   Cardiac Enzymes: No results for input(s): CKTOTAL, CKMB, CKMBINDEX, TROPONINI in the last 168 hours. BNP: Invalid input(s): POCBNP CBG: Recent Labs  Lab 03/11/19 2250  GLUCAP 98   D-Dimer No results for input(s): DDIMER in the last 72 hours. Hgb A1c No results for input(s): HGBA1C in the last 72 hours. Lipid Profile No results for input(s): CHOL, HDL, LDLCALC, TRIG, CHOLHDL, LDLDIRECT in the  last 72 hours. Thyroid function studies No results for input(s): TSH, T4TOTAL, T3FREE, THYROIDAB in the last 72 hours.  Invalid input(s): FREET3 Anemia work up No results for input(s): VITAMINB12, FOLATE, FERRITIN, TIBC, IRON, RETICCTPCT in the last 72 hours. Urinalysis    Component Value Date/Time   COLORURINE YELLOW 10/18/2013 [place] 10/18/2013 0419   LABSPEC 1.008 10/18/2013 0419   PHURINE 8.5 (H) 10/18/2013 0419   GLUCOSEU 100 (A) 10/18/2013  [place] (A) 10/18/2013 0419   BILIRUBINUR NEGATIVE 10/18/2013 0419   KETONESUR NEGATIVE 10/18/2013 0419   PROTEINUR 100 (A) 10/18/2013 0419   UROBILINOGEN 0.2 10/18/2013 0419   NITRITE NEGATIVE 10/18/2013 0419   LEUKOCYTESUR NEGATIVE 10/18/2013 0419   Sepsis Labs Invalid input(s): PROCALCITONIN,  WBC,  LACTICIDVEN Microbiology Recent Results (from the past 240 hour(s))  SARS Coronavirus 2 [facility] order, Performed in [facility] hospital lab) Nasopharyngeal Nasopharyngeal Swab     Status: None   Collection Time: 03/11/19  1:07 AM   Specimen: Nasopharyngeal Swab  Result Value Ref Range Status   SARS Coronavirus 2 NEGATIVE NEGATIVE Final    Comment: (NOTE) If result is NEGATIVE SARS-CoV-2 target nucleic acids are NOT DETECTED. The SARS-CoV-2 RNA is generally detectable in upper and lower  respiratory specimens during the acute phase of infection. The lowest  concentration of SARS-CoV-2 viral copies this assay can detect is 250  copies / mL. A negative result does not preclude SARS-CoV-2 infection  and should not be used as the sole basis for treatment or other  patient management decisions.  A negative result may occur with  improper specimen collection / handling, submission of specimen other  than nasopharyngeal swab, presence of viral mutation(s) within the  areas targeted by this assay, and inadequate number of viral copies  (<250 copies / mL). A negative result must be combined with clinical   observations, patient history, and epidemiological information. If result is POSITIVE SARS-CoV-2 target nucleic acids are DETECTED. The SARS-CoV-2 RNA is generally detectable in upper and lower  respiratory specimens dur ing the acute phase of infection.  Positive  results are indicative of active infection with SARS-CoV-2.  Clinical  correlation with patient history and other diagnostic information is  necessary to determine patient infection status.  Positive results do  not rule out bacterial infection or co-infection with other viruses. If result is PRESUMPTIVE POSTIVE SARS-CoV-2 nucleic acids MAY BE PRESENT.   A presumptive positive result was obtained on the submitted specimen  and confirmed on repeat testing.  While 2019 novel coronavirus  (SARS-CoV-2) nucleic acids may be present in the submitted sample  additional confirmatory testing may be necessary for epidemiological  and / or clinical management purposes  to differentiate between  SARS-CoV-2 and other Sarbecovirus currently known to infect humans.  If clinically indicated additional testing with an alternate test  methodology [phone]) is advised. The SARS-CoV-2 RNA is generally  detectable in upper and lower respiratory sp ecimens during the acute  phase of infection. The expected result is Negative. Fact Sheet for Patients:  StrictlyIdeas.no Fact Sheet for Healthcare Providers: BankingDealers.co.za This test is not yet approved or cleared by the [place] FDA and has been authorized for detection and/or diagnosis of SARS-CoV-2 by FDA under an Emergency Use Authorization (EUA).  This EUA will remain in effect (meaning this test can be used) for the duration of the COVID-19 declaration under Section 564(b)(1) of the Act, 21 U.S.C. section 360bbb-3(b)(1), unless the authorization is terminated or revoked sooner. Performed at [facility], [place]  [address] [place], [place] [address]     SIGNED:   Cordelia Poche, MD [facility] 03/12/2019, 1:19 PM

## 2019-03-12 NOTE — Progress Notes (Signed)
[facility] Progress Note  Subjective: breathing better today, having some RUQ/ R lat chest discomfort, near sight of his CT earlier this year  Vitals:   03/11/19 2100 03/11/19 2130 03/11/19 2303 03/12/19 0815  BP: (!) 170/70 (!) 175/70 (!) 176/81 (!) 153/103  Pulse: 82 80 74 76  Resp: _0 Temp:  98.5 F (36.9 C) 97.7 F (36.5 C)   TempSrc:  Oral Axillary   SpO2: 100% 100% 98% 100%  Weight:  76.5 kg    Height:        Inpatient medications: . amLODipine  5 mg Oral Daily  . apixaban  5 mg Oral BID  . Chlorhexidine Gluconate Cloth  6 each Topical Q0600  . guaiFENesin  1,200 mg Oral BID  . hydrALAZINE  100 mg Oral TID  . lisinopril  5 mg Oral Daily  . nitroGLYCERIN  1 inch Topical Q6H  . pantoprazole  40 mg Oral Daily  . senna-docusate  2 tablet Oral QHS   . [START ON 03/13/2019] sodium thiosulfate infusion for calciphylaxis     diphenhydrAMINE, oxyCODONE    Exam: Alert, no distress, looks better  No jvd  Chest clear bilat, wheezing resolved, no rales   Cor reg no RG   abd no ascites   Ext trace edema   NF, Ox 3    Dialysis: [facility]   4h  72kg  LUA AVF  Hep none  2/2.0 (changed here from 2.25 Ca > 2.0 with h/o Calciphylaxis )    - Na thiosulfate 25 gm tiw  - parsabiv 2.5 mg tiw      Assessment/ Plan: 1. Volume overload/ dyspnea/ early pulm edema - 3L off w/ HD last night, looks and sound better today. Wean pt off of O2 as tolerated, see if able to dc today or not.  2. ESRD -  HD TTS. Missed HD x several. HD yest evening, - 3 L 3. 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 Hypertension/volume  -  Uf on hd  bp meds  5. Anemia  - no esa  needs  With hgb 12.8  6. Metabolic bone disease -  Phos binders and as above 3. No vit d 2/2 HO Calciphylaxis       [facility] 03/12/2019, 11:28 AM  Iron/TIBC/Ferritin/ %Sat    Component Value Date/Time   IRON 34 (L) 04/26/2018 1043    TIBC 154 (L) 04/26/2018 1043   FERRITIN 301 04/26/2018 1043   IRONPCTSAT 22 04/26/2018 1043   Recent Labs  Lab 03/11/19 0314  NA 137  K 4.6  CL 96*  CO2 23  GLUCOSE 117*  BUN 64*  CREATININE 10.80*  CALCIUM 6.9*  ALBUMIN 3.7   Recent Labs  Lab 03/11/19 0314  AST 18  ALT 10  ALKPHOS 144*  BILITOT 0.7  PROT 8.7*   Recent Labs  Lab 03/11/19 0314  WBC 6.5  HGB 12.8*  HCT 41.3  PLT 120*

## 2019-03-13 ENCOUNTER — Other Ambulatory Visit: Payer: Self-pay

## 2019-03-13 ENCOUNTER — Emergency Department (HOSPITAL_COMMUNITY)
Admission: EM | Admit: 2019-03-13 | Discharge: 2019-03-13 | Disposition: A | Discharge status: Home / Self Care | Payer: Medicare Other | Attending: Emergency Medicine | Admitting: Emergency Medicine

## 2019-03-13 ENCOUNTER — Emergency Department (HOSPITAL_COMMUNITY): Payer: Medicare Other

## 2019-03-13 ENCOUNTER — Encounter (HOSPITAL_COMMUNITY): Payer: Self-pay

## 2019-03-13 DIAGNOSIS — Z940 Kidney transplant status: Secondary | ICD-10-CM | POA: Diagnosis not present

## 2019-03-13 DIAGNOSIS — R0602 Shortness of breath: Secondary | ICD-10-CM | POA: Diagnosis not present

## 2019-03-13 DIAGNOSIS — I5042 Chronic combined systolic (congestive) and diastolic (congestive) heart failure: Secondary | ICD-10-CM | POA: Diagnosis not present

## 2019-03-13 DIAGNOSIS — Z7901 Long term (current) use of anticoagulants: Secondary | ICD-10-CM | POA: Diagnosis not present

## 2019-03-13 DIAGNOSIS — N186 End stage renal disease: Secondary | ICD-10-CM | POA: Insufficient documentation

## 2019-03-13 DIAGNOSIS — Z992 Dependence on renal dialysis: Secondary | ICD-10-CM | POA: Diagnosis not present

## 2019-03-13 DIAGNOSIS — I132 Hypertensive heart and chronic kidney disease with heart failure and with stage 5 chronic kidney disease, or end stage renal disease: Secondary | ICD-10-CM | POA: Diagnosis not present

## 2019-03-13 DIAGNOSIS — Z794 Long term (current) use of insulin: Secondary | ICD-10-CM | POA: Diagnosis not present

## 2019-03-13 DIAGNOSIS — Z87891 Personal history of nicotine dependence: Secondary | ICD-10-CM | POA: Diagnosis not present

## 2019-03-13 DIAGNOSIS — D509 Iron deficiency anemia, unspecified: Secondary | ICD-10-CM | POA: Diagnosis not present

## 2019-03-13 DIAGNOSIS — N2581 Secondary hyperparathyroidism of renal origin: Secondary | ICD-10-CM | POA: Diagnosis not present

## 2019-03-13 DIAGNOSIS — Z79899 Other long term (current) drug therapy: Secondary | ICD-10-CM | POA: Insufficient documentation

## 2019-03-13 DIAGNOSIS — R079 Chest pain, unspecified: Secondary | ICD-10-CM | POA: Diagnosis not present

## 2019-03-13 DIAGNOSIS — D631 Anemia in chronic kidney disease: Secondary | ICD-10-CM | POA: Diagnosis not present

## 2019-03-13 DIAGNOSIS — E1122 Type 2 diabetes mellitus with diabetic chronic kidney disease: Secondary | ICD-10-CM | POA: Insufficient documentation

## 2019-03-13 DIAGNOSIS — E875 Hyperkalemia: Secondary | ICD-10-CM | POA: Diagnosis not present

## 2019-03-13 LAB — CBC WITH DIFFERENTIAL/PLATELET
Abs Immature Granulocytes: 0.02 10*3/uL (ref 0.00–0.07)
Basophils Absolute: 0 10*3/uL (ref 0.0–0.1)
Basophils Relative: 0 %
Eosinophils Absolute: 0 10*3/uL (ref 0.0–0.5)
Eosinophils Relative: 0 %
HCT: 37.9 % — ABNORMAL LOW (ref 39.0–52.0)
Hemoglobin: 11.8 g/dL — ABNORMAL LOW (ref 13.0–17.0)
Immature Granulocytes: 0 %
Lymphocytes Relative: 5 %
Lymphs Abs: 0.3 10*3/uL — ABNORMAL LOW (ref 0.7–4.0)
MCH: 27.5 pg (ref 26.0–34.0)
MCHC: 31.1 g/dL (ref 30.0–36.0)
MCV: 88.3 fL (ref 80.0–100.0)
Monocytes Absolute: 0.4 10*3/uL (ref 0.1–1.0)
Monocytes Relative: 8 %
Neutro Abs: 4.3 10*3/uL (ref 1.7–7.7)
Neutrophils Relative %: 87 %
Platelets: 101 10*3/uL — ABNORMAL LOW (ref 150–400)
RBC: 4.29 MIL/uL (ref 4.22–5.81)
RDW: 17.1 % — ABNORMAL HIGH (ref 11.5–15.5)
WBC: 5 10*3/uL (ref 4.0–10.5)
nRBC: 0 % (ref 0.0–0.2)

## 2019-03-13 LAB — BASIC METABOLIC PANEL
Anion gap: 19 — ABNORMAL HIGH (ref 5–15)
BUN: 57 mg/dL — ABNORMAL HIGH (ref 6–20)
CO2: 21 mmol/L — ABNORMAL LOW (ref 22–32)
Calcium: 7.5 mg/dL — ABNORMAL LOW (ref 8.9–10.3)
Chloride: 98 mmol/L (ref 98–111)
Creatinine, Ser: 10.61 mg/dL — ABNORMAL HIGH (ref 0.61–1.24)
GFR calc Af Amer: 6 mL/min — ABNORMAL LOW (ref >60)
GFR calc non Af Amer: 5 mL/min — ABNORMAL LOW (ref >60)
Glucose, Bld: 144 mg/dL — ABNORMAL HIGH (ref 70–99)
Potassium: 3.8 mmol/L (ref 3.5–5.1)
Sodium: 138 mmol/L (ref 135–145)

## 2019-03-13 LAB — TROPONIN I (HIGH SENSITIVITY)
Troponin I (High Sensitivity): 40 ng/L — ABNORMAL HIGH (ref <18)
Troponin I (High Sensitivity): 38 ng/L — ABNORMAL HIGH (ref <18)

## 2019-03-13 MED ORDER — SODIUM CHLORIDE 0.9% FLUSH
3.0000 mL | Freq: Once | INTRAVENOUS | Status: AC
  Administered 2019-03-13: 3 mL via INTRAVENOUS

## 2019-03-13 MED ORDER — ALBUTEROL SULFATE HFA 108 (90 BASE) MCG/ACT IN AERS
8.0000 | INHALATION_SPRAY | Freq: Once | RESPIRATORY_TRACT | Status: AC
  Administered 2019-03-13: 8 via RESPIRATORY_TRACT
  Filled 2019-03-13: qty 6.7

## 2019-03-13 NOTE — ED Triage Notes (Signed)
Pt reports SOB especially when he wakes up from sleep. He also reports chest tightness. Pt last dialysis Thursday. Denies fever or cough.

## 2019-03-13 NOTE — Discharge Instructions (Addendum)
Follow up with your doctor for recheck this week. Continue your regular medications. Use inhaler every 4 hours as needed.

## 2019-03-13 NOTE — ED Notes (Signed)
While writer was in pt's room, pt asked writer to place him on oxygen. Writer informed pt his oxygen saturations were at 96% and would send Wille Glaser, RN to assess if oxygen was needed. Pt became upset with Probation officer and said, "why can't you just do what I ask? I've been on oxygen all day in the hospital and you are going to do what I say". Writer informed pt that due to safety measures, writer would not place pt on oxygen and Wille Glaser, RN would assist pt momentarily.

## 2019-03-13 NOTE — ED Notes (Signed)
Pt transported to xray 
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
## 2019-03-13 NOTE — ED Notes (Signed)
Primary RN Jon at bedside with pt

## 2019-03-15 ENCOUNTER — Other Ambulatory Visit: Payer: Self-pay

## 2019-03-15 DIAGNOSIS — Z992 Dependence on renal dialysis: Secondary | ICD-10-CM | POA: Diagnosis not present

## 2019-03-15 DIAGNOSIS — Z7901 Long term (current) use of anticoagulants: Secondary | ICD-10-CM | POA: Diagnosis not present

## 2019-03-15 DIAGNOSIS — R05 Cough: Secondary | ICD-10-CM | POA: Diagnosis not present

## 2019-03-15 DIAGNOSIS — R0981 Nasal congestion: Secondary | ICD-10-CM | POA: Insufficient documentation

## 2019-03-15 DIAGNOSIS — R062 Wheezing: Secondary | ICD-10-CM | POA: Diagnosis not present

## 2019-03-15 DIAGNOSIS — E1122 Type 2 diabetes mellitus with diabetic chronic kidney disease: Secondary | ICD-10-CM | POA: Diagnosis not present

## 2019-03-15 DIAGNOSIS — Z79899 Other long term (current) drug therapy: Secondary | ICD-10-CM | POA: Diagnosis not present

## 2019-03-15 DIAGNOSIS — I5042 Chronic combined systolic (congestive) and diastolic (congestive) heart failure: Secondary | ICD-10-CM | POA: Insufficient documentation

## 2019-03-15 DIAGNOSIS — I132 Hypertensive heart and chronic kidney disease with heart failure and with stage 5 chronic kidney disease, or end stage renal disease: Secondary | ICD-10-CM | POA: Insufficient documentation

## 2019-03-15 DIAGNOSIS — N186 End stage renal disease: Secondary | ICD-10-CM | POA: Diagnosis not present

## 2019-03-16 ENCOUNTER — Other Ambulatory Visit: Payer: Self-pay

## 2019-03-16 ENCOUNTER — Emergency Department (HOSPITAL_COMMUNITY)
Admission: EM | Admit: 2019-03-16 | Discharge: 2019-03-16 | Disposition: A | Discharge status: Home / Self Care | Payer: Medicare Other | Attending: Emergency Medicine | Admitting: Emergency Medicine

## 2019-03-16 ENCOUNTER — Encounter (HOSPITAL_COMMUNITY): Payer: Self-pay | Admitting: Emergency Medicine

## 2019-03-16 ENCOUNTER — Emergency Department (HOSPITAL_COMMUNITY): Payer: Medicare Other

## 2019-03-16 DIAGNOSIS — R059 Cough, unspecified: Secondary | ICD-10-CM

## 2019-03-16 DIAGNOSIS — R05 Cough: Secondary | ICD-10-CM

## 2019-03-16 DIAGNOSIS — Z992 Dependence on renal dialysis: Secondary | ICD-10-CM | POA: Diagnosis not present

## 2019-03-16 DIAGNOSIS — N2581 Secondary hyperparathyroidism of renal origin: Secondary | ICD-10-CM | POA: Diagnosis not present

## 2019-03-16 DIAGNOSIS — D631 Anemia in chronic kidney disease: Secondary | ICD-10-CM | POA: Diagnosis not present

## 2019-03-16 DIAGNOSIS — E875 Hyperkalemia: Secondary | ICD-10-CM | POA: Diagnosis not present

## 2019-03-16 DIAGNOSIS — N186 End stage renal disease: Secondary | ICD-10-CM | POA: Diagnosis not present

## 2019-03-16 DIAGNOSIS — D509 Iron deficiency anemia, unspecified: Secondary | ICD-10-CM | POA: Diagnosis not present

## 2019-03-16 DIAGNOSIS — R0981 Nasal congestion: Secondary | ICD-10-CM

## 2019-03-16 MED ORDER — ALBUTEROL SULFATE HFA 108 (90 BASE) MCG/ACT IN AERS
8.0000 | INHALATION_SPRAY | Freq: Once | RESPIRATORY_TRACT | Status: AC
  Administered 2019-03-16: 03:00:00 8 via RESPIRATORY_TRACT
  Filled 2019-03-16: qty 6.7

## 2019-03-16 MED ORDER — ALBUTEROL SULFATE HFA 108 (90 BASE) MCG/ACT IN AERS
2.0000 | INHALATION_SPRAY | RESPIRATORY_TRACT | Status: DC
  Administered 2019-03-16: 2 via RESPIRATORY_TRACT

## 2019-03-16 MED ORDER — AEROCHAMBER Z-STAT PLUS/MEDIUM MISC: 1.0000 | Freq: Once | Status: DC
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
## 2019-03-16 NOTE — ED Notes (Signed)
Pt asleep. Pt not in any acute distress

## 2019-03-16 NOTE — ED Notes (Signed)
Attempted to get blood without success.

## 2019-03-16 NOTE — Discharge Instructions (Signed)
Thank you for allowing me to care for you today in the Emergency Department.   You can use 2 puffs of the albuterol inhaler at home with a spacer for wheezing.  Coricidin is available over-the-counter and can help with nasal congestion.  It will also not elevate your blood pressure.  I would stop using the Mucinex because this can cause her blood pressure to elevate.  Make sure you go to dialysis today.  Return to the emergency department if you develop chest pain, severe shortness of breath, vomiting, or other new, concerning symptoms.

## 2019-03-16 NOTE — ED Notes (Signed)
PT refuse to have blood draw

## 2019-03-16 NOTE — ED Notes (Signed)
Pt verbalized discharge instructions and follow up care. Alert and ambulatory. No IV. No further questions at this time. Pt stated he was going to dialysis today. Has a ride home.

## 2019-03-16 NOTE — ED Triage Notes (Addendum)
Patient here from home with complaints of cough and chest congestion x1 week. Reports that he was seen for same last week. Denies fever. Dialysis patient.

## 2019-03-18 ENCOUNTER — Other Ambulatory Visit: Payer: Self-pay

## 2019-03-18 ENCOUNTER — Encounter (HOSPITAL_COMMUNITY): Payer: Self-pay

## 2019-03-18 DIAGNOSIS — N186 End stage renal disease: Secondary | ICD-10-CM | POA: Insufficient documentation

## 2019-03-18 DIAGNOSIS — Z992 Dependence on renal dialysis: Secondary | ICD-10-CM | POA: Insufficient documentation

## 2019-03-18 DIAGNOSIS — Z87891 Personal history of nicotine dependence: Secondary | ICD-10-CM | POA: Insufficient documentation

## 2019-03-18 DIAGNOSIS — E875 Hyperkalemia: Secondary | ICD-10-CM | POA: Diagnosis not present

## 2019-03-18 DIAGNOSIS — I5042 Chronic combined systolic (congestive) and diastolic (congestive) heart failure: Secondary | ICD-10-CM | POA: Diagnosis not present

## 2019-03-18 DIAGNOSIS — R0981 Nasal congestion: Secondary | ICD-10-CM | POA: Insufficient documentation

## 2019-03-18 DIAGNOSIS — R0602 Shortness of breath: Secondary | ICD-10-CM | POA: Diagnosis not present

## 2019-03-18 DIAGNOSIS — R05 Cough: Secondary | ICD-10-CM | POA: Diagnosis not present

## 2019-03-18 DIAGNOSIS — E1122 Type 2 diabetes mellitus with diabetic chronic kidney disease: Secondary | ICD-10-CM | POA: Diagnosis not present

## 2019-03-18 DIAGNOSIS — N2581 Secondary hyperparathyroidism of renal origin: Secondary | ICD-10-CM | POA: Diagnosis not present

## 2019-03-18 DIAGNOSIS — D509 Iron deficiency anemia, unspecified: Secondary | ICD-10-CM | POA: Diagnosis not present

## 2019-03-18 DIAGNOSIS — I132 Hypertensive heart and chronic kidney disease with heart failure and with stage 5 chronic kidney disease, or end stage renal disease: Secondary | ICD-10-CM | POA: Insufficient documentation

## 2019-03-18 DIAGNOSIS — Z940 Kidney transplant status: Secondary | ICD-10-CM | POA: Diagnosis not present

## 2019-03-18 DIAGNOSIS — D631 Anemia in chronic kidney disease: Secondary | ICD-10-CM | POA: Diagnosis not present

## 2019-03-18 DIAGNOSIS — I428 Other cardiomyopathies: Secondary | ICD-10-CM | POA: Insufficient documentation

## 2019-03-18 NOTE — ED Triage Notes (Signed)
Pt reports dyspnea when lying down, cough and congestion for weeks. He states that he has been seen for same. He states that it is preventing him from going to dialysis and he missed his last appointment. He states that he has dialysis scheduled tomorrow. Denies fever or vomiting. He also states that he thinks that there if fluid collecting outside of his R lung.

## 2019-03-19 ENCOUNTER — Emergency Department (HOSPITAL_COMMUNITY): Payer: Medicare Other

## 2019-03-19 ENCOUNTER — Emergency Department (HOSPITAL_COMMUNITY)
Admission: EM | Admit: 2019-03-19 | Discharge: 2019-03-19 | Disposition: A | Discharge status: Home / Self Care | Payer: Medicare Other | Attending: Emergency Medicine | Admitting: Emergency Medicine

## 2019-03-19 DIAGNOSIS — R0602 Shortness of breath: Secondary | ICD-10-CM | POA: Diagnosis not present

## 2019-03-19 DIAGNOSIS — R05 Cough: Secondary | ICD-10-CM

## 2019-03-19 DIAGNOSIS — R059 Cough, unspecified: Secondary | ICD-10-CM

## 2019-03-19 MED ORDER — ALBUTEROL SULFATE HFA 108 (90 BASE) MCG/ACT IN AERS
2.0000 | INHALATION_SPRAY | Freq: Once | RESPIRATORY_TRACT | Status: AC
  Administered 2019-03-19: 08:00:00 2 via RESPIRATORY_TRACT
  Filled 2019-03-19: qty 6.7

## 2019-03-19 NOTE — ED Notes (Signed)
PATIENT REFUSING EKG. Stated he just came here for an inhaler and wants to go home. MD NOTIFIED

## 2019-03-19 NOTE — Discharge Instructions (Addendum)
You were evaluated in the Emergency Department and after careful evaluation, we did not find any emergent condition requiring admission or further testing in the hospital.  Your exam/testing today was overall reassuring.  Please return to the Emergency Department if you experience any worsening of your condition.  We encourage you to follow up with a primary care provider.  Thank you for allowing [place] to be a part of your care.

## 2019-03-19 NOTE — ED Notes (Signed)
Pt states that he will be waiting outside of the ED. He states that he can breathe better outside. No distress noted. Pulse Ox 99-100% on RA.

## 2019-03-19 NOTE — ED Notes (Signed)
Patient asked for breakfast tray, really only wants corn flakes and milk. This Probation officer contacted EDP and EDP put diet in. This Probation officer went to cafeteria to get patient corn flakes and milk. There were none able to be purchased.
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
## 2019-03-20 ENCOUNTER — Emergency Department (HOSPITAL_COMMUNITY): Payer: Medicare Other

## 2019-03-20 ENCOUNTER — Emergency Department (HOSPITAL_COMMUNITY)
Admission: EM | Admit: 2019-03-20 | Discharge: 2019-03-20 | Disposition: A | Discharge status: Home / Self Care | Payer: Medicare Other | Attending: Emergency Medicine | Admitting: Emergency Medicine

## 2019-03-20 ENCOUNTER — Other Ambulatory Visit: Payer: Self-pay

## 2019-03-20 ENCOUNTER — Encounter (HOSPITAL_COMMUNITY): Payer: Self-pay | Admitting: Emergency Medicine

## 2019-03-20 DIAGNOSIS — M79672 Pain in left foot: Secondary | ICD-10-CM | POA: Diagnosis not present

## 2019-03-20 DIAGNOSIS — W228XXA Striking against or struck by other objects, initial encounter: Secondary | ICD-10-CM | POA: Insufficient documentation

## 2019-03-20 DIAGNOSIS — Z7901 Long term (current) use of anticoagulants: Secondary | ICD-10-CM | POA: Insufficient documentation

## 2019-03-20 DIAGNOSIS — Y929 Unspecified place or not applicable: Secondary | ICD-10-CM | POA: Insufficient documentation

## 2019-03-20 DIAGNOSIS — E1122 Type 2 diabetes mellitus with diabetic chronic kidney disease: Secondary | ICD-10-CM | POA: Insufficient documentation

## 2019-03-20 DIAGNOSIS — Z992 Dependence on renal dialysis: Secondary | ICD-10-CM | POA: Diagnosis not present

## 2019-03-20 DIAGNOSIS — Y939 Activity, unspecified: Secondary | ICD-10-CM | POA: Insufficient documentation

## 2019-03-20 DIAGNOSIS — I132 Hypertensive heart and chronic kidney disease with heart failure and with stage 5 chronic kidney disease, or end stage renal disease: Secondary | ICD-10-CM | POA: Insufficient documentation

## 2019-03-20 DIAGNOSIS — Z79899 Other long term (current) drug therapy: Secondary | ICD-10-CM | POA: Diagnosis not present

## 2019-03-20 DIAGNOSIS — I5042 Chronic combined systolic (congestive) and diastolic (congestive) heart failure: Secondary | ICD-10-CM | POA: Diagnosis not present

## 2019-03-20 DIAGNOSIS — Y999 Unspecified external cause status: Secondary | ICD-10-CM | POA: Insufficient documentation

## 2019-03-20 DIAGNOSIS — M79675 Pain in left toe(s): Secondary | ICD-10-CM | POA: Diagnosis not present

## 2019-03-20 DIAGNOSIS — Z87891 Personal history of nicotine dependence: Secondary | ICD-10-CM | POA: Insufficient documentation

## 2019-03-20 DIAGNOSIS — N186 End stage renal disease: Secondary | ICD-10-CM | POA: Insufficient documentation

## 2019-03-20 DIAGNOSIS — N2581 Secondary hyperparathyroidism of renal origin: Secondary | ICD-10-CM | POA: Diagnosis not present

## 2019-03-20 DIAGNOSIS — D509 Iron deficiency anemia, unspecified: Secondary | ICD-10-CM | POA: Diagnosis not present

## 2019-03-20 DIAGNOSIS — D631 Anemia in chronic kidney disease: Secondary | ICD-10-CM | POA: Diagnosis not present

## 2019-03-20 DIAGNOSIS — E875 Hyperkalemia: Secondary | ICD-10-CM | POA: Diagnosis not present

## 2019-03-20 MED ORDER — ACETAMINOPHEN 500 MG PO TABS
1000.0000 mg | ORAL_TABLET | Freq: Once | ORAL | Status: DC
  Filled 2019-03-20: qty 2

## 2019-03-20 MED ORDER — OXYCODONE-ACETAMINOPHEN 5-325 MG PO TABS
1.0000 | ORAL_TABLET | Freq: Once | ORAL | Status: AC
  Administered 2019-03-20: 03:00:00 1 via ORAL
  Filled 2019-03-20: qty 1

## 2019-03-20 NOTE — ED Notes (Signed)
Pt is back in triage in a jeri chair. Pt was given cheese, saltines, graham crackers, and captains crackers
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
## 2019-03-20 NOTE — ED Triage Notes (Signed)
Pt st's he stumped his toe earlier tonight  Pt c/o severe pain to left second toe.

## 2019-03-20 NOTE — Discharge Instructions (Addendum)
X-ray is negative. No fracture.   Take 1000 mg acetaminophen every 6 hours.  Wear hard shoe for protection until pain resolves  Follow up with primary care doctor in 7-10 days if pain is not improving  Return to ER for toe redness warmth fever pus

## 2019-03-20 NOTE — ED Notes (Signed)
Patient educated about not driving or performing other critical tasks (such as operating heavy machinery, caring for infant/toddler/child) due to sedative nature of narcotic medications received while in the ED.  Pt/caregiver verbalized understanding. Pt says he is allergic to tylenol, PA made aware at d/c, no new orders.

## 2019-03-23 ENCOUNTER — Emergency Department (HOSPITAL_COMMUNITY): Payer: Medicare Other

## 2019-03-23 ENCOUNTER — Other Ambulatory Visit: Payer: Self-pay

## 2019-03-23 ENCOUNTER — Emergency Department (HOSPITAL_COMMUNITY)
Admission: EM | Admit: 2019-03-23 | Discharge: 2019-03-23 | Disposition: A | Discharge status: Home / Self Care | Payer: Medicare Other | Attending: Emergency Medicine | Admitting: Emergency Medicine

## 2019-03-23 ENCOUNTER — Encounter (HOSPITAL_COMMUNITY): Payer: Self-pay

## 2019-03-23 DIAGNOSIS — N186 End stage renal disease: Secondary | ICD-10-CM | POA: Diagnosis not present

## 2019-03-23 DIAGNOSIS — I5042 Chronic combined systolic (congestive) and diastolic (congestive) heart failure: Secondary | ICD-10-CM | POA: Insufficient documentation

## 2019-03-23 DIAGNOSIS — Z992 Dependence on renal dialysis: Secondary | ICD-10-CM | POA: Insufficient documentation

## 2019-03-23 DIAGNOSIS — R05 Cough: Secondary | ICD-10-CM | POA: Insufficient documentation

## 2019-03-23 DIAGNOSIS — E1122 Type 2 diabetes mellitus with diabetic chronic kidney disease: Secondary | ICD-10-CM | POA: Insufficient documentation

## 2019-03-23 DIAGNOSIS — Z79899 Other long term (current) drug therapy: Secondary | ICD-10-CM | POA: Diagnosis not present

## 2019-03-23 DIAGNOSIS — Z87891 Personal history of nicotine dependence: Secondary | ICD-10-CM | POA: Diagnosis not present

## 2019-03-23 DIAGNOSIS — T8610 Unspecified complication of kidney transplant: Secondary | ICD-10-CM | POA: Diagnosis not present

## 2019-03-23 DIAGNOSIS — J90 Pleural effusion, not elsewhere classified: Secondary | ICD-10-CM | POA: Diagnosis not present

## 2019-03-23 DIAGNOSIS — N2581 Secondary hyperparathyroidism of renal origin: Secondary | ICD-10-CM | POA: Diagnosis not present

## 2019-03-23 DIAGNOSIS — I132 Hypertensive heart and chronic kidney disease with heart failure and with stage 5 chronic kidney disease, or end stage renal disease: Secondary | ICD-10-CM | POA: Diagnosis not present

## 2019-03-23 DIAGNOSIS — E875 Hyperkalemia: Secondary | ICD-10-CM | POA: Diagnosis not present

## 2019-03-23 DIAGNOSIS — R0981 Nasal congestion: Secondary | ICD-10-CM | POA: Diagnosis not present

## 2019-03-23 DIAGNOSIS — D631 Anemia in chronic kidney disease: Secondary | ICD-10-CM | POA: Diagnosis not present

## 2019-03-23 IMAGING — DX DG CHEST 2V
2 series · 2 of 2 positions shown · non-contrast
Comparison: Chest radiograph performed 10/20/2016

CLINICAL DATA: Acute onset of generalized chest pain, radiating to
the abdomen. Cough, shortness of breath and dizziness. Initial
encounter.

EXAM:
CHEST  2 VIEW

[chest pa]
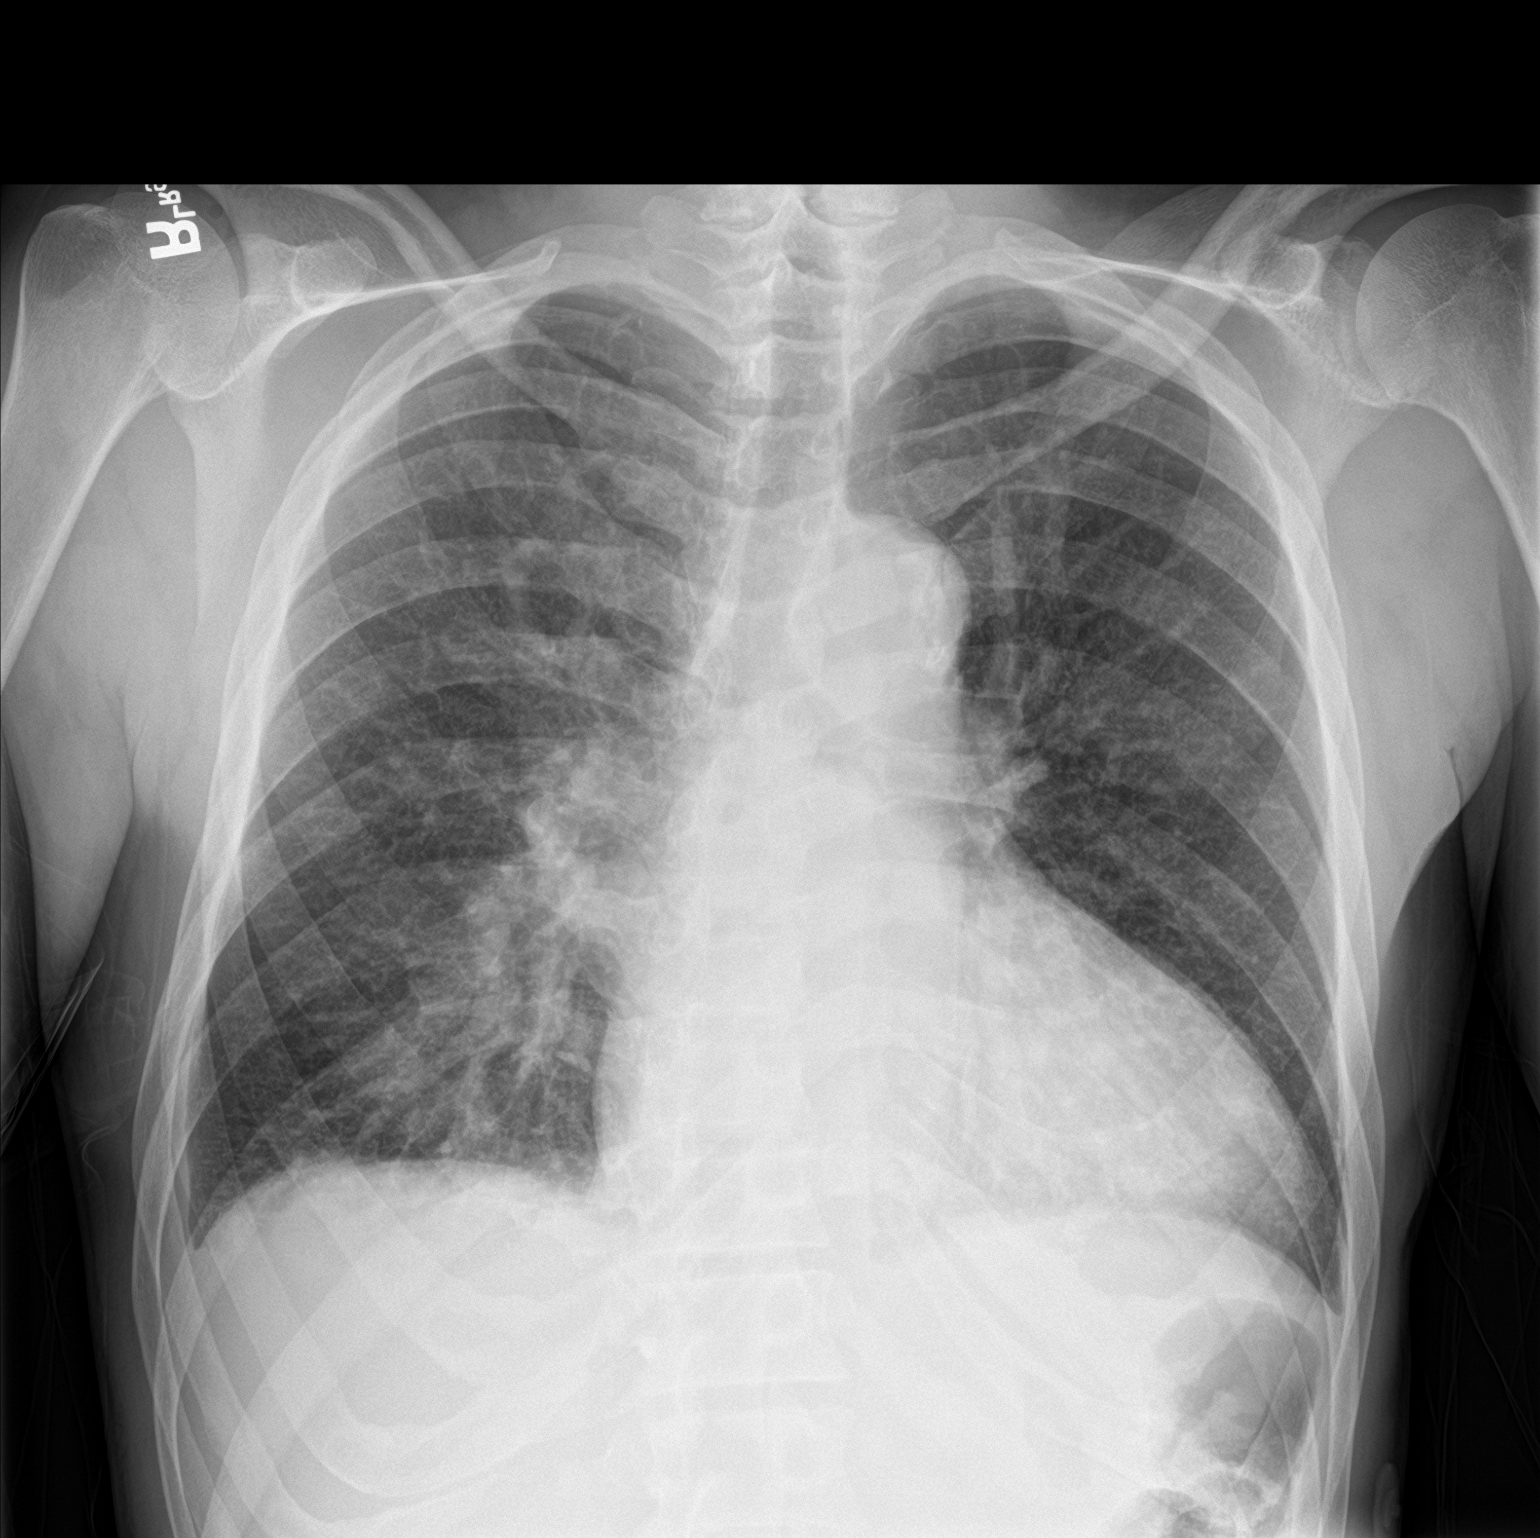

[chest lat]
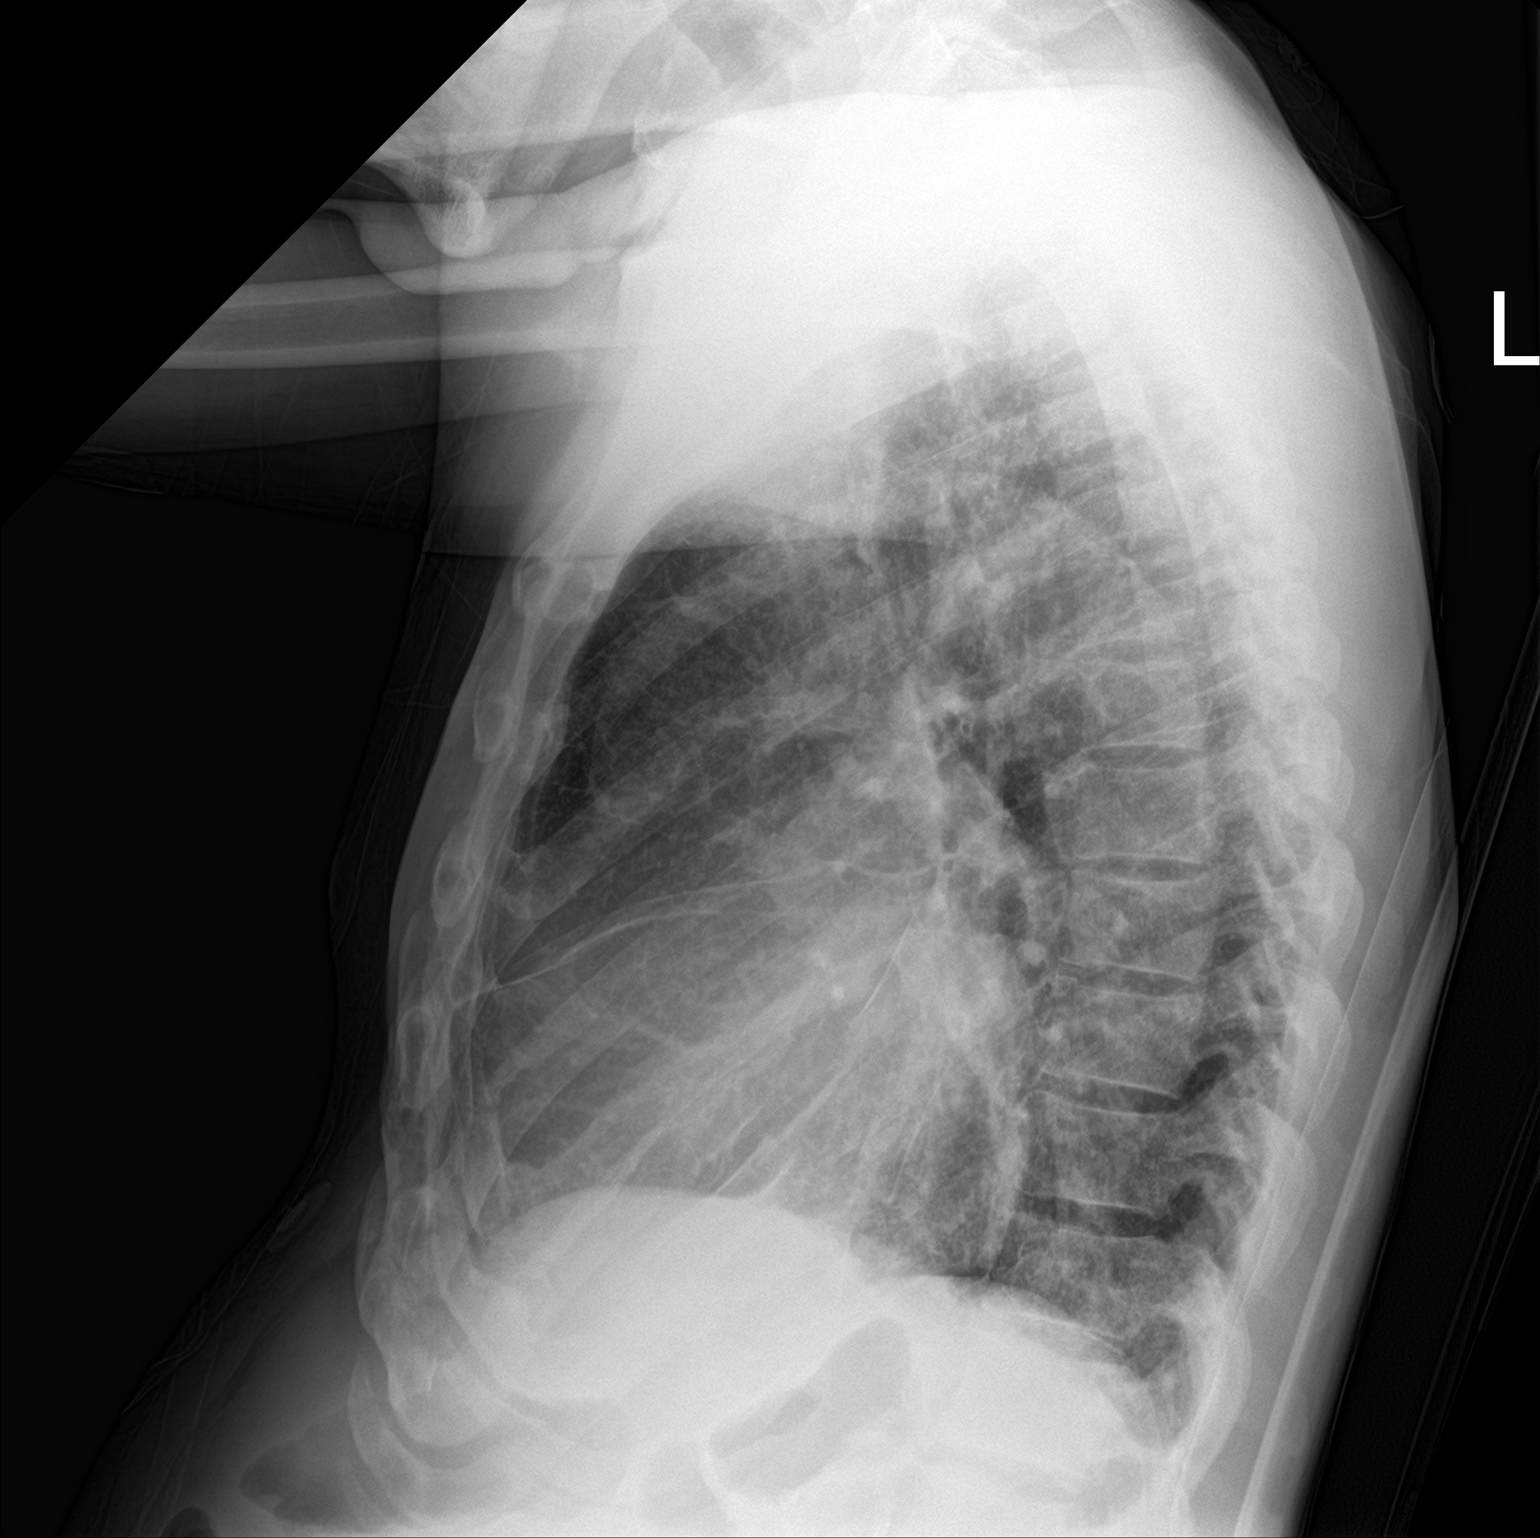

[2 of 2 positions shown; findings below may reference images not displayed]

FINDINGS: The lungs are well-aerated. Vascular congestion is noted. Increased
interstitial markings raise concern for mild interstitial edema.
There is no evidence of pleural effusion or pneumothorax.

The heart is borderline enlarged. No acute osseous abnormalities are
seen.
IMPRESSION: Vascular congestion and borderline cardiomegaly. Increased
interstitial markings raise concern for mild interstitial edema.
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
## 2019-03-23 NOTE — ED Notes (Signed)
Pt placed on 2 liters [place] because he states that it makes him rest good and will be able to go to dialysis tomorrow

## 2019-03-24 ENCOUNTER — Encounter: Payer: Medicare Other | Admitting: Cardiothoracic Surgery

## 2019-03-26 DIAGNOSIS — Z79891 Long term (current) use of opiate analgesic: Secondary | ICD-10-CM | POA: Diagnosis not present

## 2019-03-26 DIAGNOSIS — G8929 Other chronic pain: Secondary | ICD-10-CM | POA: Diagnosis not present

## 2019-03-26 DIAGNOSIS — M545 Low back pain: Secondary | ICD-10-CM | POA: Diagnosis not present

## 2019-03-26 DIAGNOSIS — S41101S Unspecified open wound of right upper arm, sequela: Secondary | ICD-10-CM | POA: Diagnosis not present

## 2019-03-27 DIAGNOSIS — N2581 Secondary hyperparathyroidism of renal origin: Secondary | ICD-10-CM | POA: Diagnosis not present

## 2019-03-27 DIAGNOSIS — E875 Hyperkalemia: Secondary | ICD-10-CM | POA: Diagnosis not present

## 2019-03-27 DIAGNOSIS — D631 Anemia in chronic kidney disease: Secondary | ICD-10-CM | POA: Diagnosis not present

## 2019-03-27 DIAGNOSIS — Z992 Dependence on renal dialysis: Secondary | ICD-10-CM | POA: Diagnosis not present

## 2019-03-27 DIAGNOSIS — E1122 Type 2 diabetes mellitus with diabetic chronic kidney disease: Secondary | ICD-10-CM | POA: Diagnosis not present

## 2019-03-27 DIAGNOSIS — N186 End stage renal disease: Secondary | ICD-10-CM | POA: Diagnosis not present

## 2019-03-29 ENCOUNTER — Emergency Department (HOSPITAL_COMMUNITY): Payer: Medicare Other

## 2019-03-29 ENCOUNTER — Emergency Department (HOSPITAL_COMMUNITY)
Admission: EM | Admit: 2019-03-29 | Discharge: 2019-03-30 | Disposition: A | Discharge status: Home / Self Care | Payer: Medicare Other | Attending: Emergency Medicine | Admitting: Emergency Medicine

## 2019-03-29 ENCOUNTER — Other Ambulatory Visit: Payer: Self-pay

## 2019-03-29 ENCOUNTER — Encounter (HOSPITAL_COMMUNITY): Payer: Self-pay | Admitting: Emergency Medicine

## 2019-03-29 DIAGNOSIS — M25421 Effusion, right elbow: Secondary | ICD-10-CM | POA: Diagnosis not present

## 2019-03-29 DIAGNOSIS — I132 Hypertensive heart and chronic kidney disease with heart failure and with stage 5 chronic kidney disease, or end stage renal disease: Secondary | ICD-10-CM | POA: Insufficient documentation

## 2019-03-29 DIAGNOSIS — R2231 Localized swelling, mass and lump, right upper limb: Secondary | ICD-10-CM | POA: Diagnosis present

## 2019-03-29 DIAGNOSIS — S51801A Unspecified open wound of right forearm, initial encounter: Secondary | ICD-10-CM | POA: Diagnosis not present

## 2019-03-29 DIAGNOSIS — Z79899 Other long term (current) drug therapy: Secondary | ICD-10-CM | POA: Insufficient documentation

## 2019-03-29 DIAGNOSIS — Z992 Dependence on renal dialysis: Secondary | ICD-10-CM | POA: Diagnosis not present

## 2019-03-29 DIAGNOSIS — Z87891 Personal history of nicotine dependence: Secondary | ICD-10-CM | POA: Insufficient documentation

## 2019-03-29 DIAGNOSIS — E1122 Type 2 diabetes mellitus with diabetic chronic kidney disease: Secondary | ICD-10-CM | POA: Diagnosis not present

## 2019-03-29 DIAGNOSIS — Z7901 Long term (current) use of anticoagulants: Secondary | ICD-10-CM | POA: Diagnosis not present

## 2019-03-29 DIAGNOSIS — I5042 Chronic combined systolic (congestive) and diastolic (congestive) heart failure: Secondary | ICD-10-CM | POA: Diagnosis not present

## 2019-03-29 DIAGNOSIS — M2548 Effusion, other site: Secondary | ICD-10-CM | POA: Diagnosis not present

## 2019-03-29 DIAGNOSIS — N186 End stage renal disease: Secondary | ICD-10-CM | POA: Insufficient documentation

## 2019-03-29 NOTE — ED Notes (Signed)
Patient transported to X-ray 

## 2019-03-29 NOTE — ED Triage Notes (Signed)
Patient c/o worsening swelling to right arm x2 days. Hx clots. Reports taking blood thinners as prescribed. Radial pulse present. Tuesday/Thursday/Saturday dialysis patient.
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
## 2019-03-30 DIAGNOSIS — E1122 Type 2 diabetes mellitus with diabetic chronic kidney disease: Secondary | ICD-10-CM | POA: Diagnosis not present

## 2019-03-30 DIAGNOSIS — N186 End stage renal disease: Secondary | ICD-10-CM | POA: Diagnosis not present

## 2019-03-30 DIAGNOSIS — D631 Anemia in chronic kidney disease: Secondary | ICD-10-CM | POA: Diagnosis not present

## 2019-03-30 DIAGNOSIS — N2581 Secondary hyperparathyroidism of renal origin: Secondary | ICD-10-CM | POA: Diagnosis not present

## 2019-03-30 DIAGNOSIS — Z992 Dependence on renal dialysis: Secondary | ICD-10-CM | POA: Diagnosis not present

## 2019-03-30 DIAGNOSIS — E875 Hyperkalemia: Secondary | ICD-10-CM | POA: Diagnosis not present

## 2019-03-30 LAB — CBC WITH DIFFERENTIAL/PLATELET
Abs Immature Granulocytes: 0.02 10*3/uL (ref 0.00–0.07)
Basophils Absolute: 0 10*3/uL (ref 0.0–0.1)
Basophils Relative: 1 %
Eosinophils Absolute: 0.3 10*3/uL (ref 0.0–0.5)
Eosinophils Relative: 8 %
HCT: 32.5 % — ABNORMAL LOW (ref 39.0–52.0)
Hemoglobin: 10.1 g/dL — ABNORMAL LOW (ref 13.0–17.0)
Immature Granulocytes: 1 %
Lymphocytes Relative: 17 %
Lymphs Abs: 0.6 10*3/uL — ABNORMAL LOW (ref 0.7–4.0)
MCH: 28.1 pg (ref 26.0–34.0)
MCHC: 31.1 g/dL (ref 30.0–36.0)
MCV: 90.3 fL (ref 80.0–100.0)
Monocytes Absolute: 0.5 10*3/uL (ref 0.1–1.0)
Monocytes Relative: 15 %
Neutro Abs: 1.9 10*3/uL (ref 1.7–7.7)
Neutrophils Relative %: 58 %
Platelets: 124 10*3/uL — ABNORMAL LOW (ref 150–400)
RBC: 3.6 MIL/uL — ABNORMAL LOW (ref 4.22–5.81)
RDW: 16.5 % — ABNORMAL HIGH (ref 11.5–15.5)
WBC: 3.2 10*3/uL — ABNORMAL LOW (ref 4.0–10.5)
nRBC: 0 % (ref 0.0–0.2)

## 2019-03-30 LAB — I-STAT CHEM 8, ED
BUN: 51 mg/dL — ABNORMAL HIGH (ref 6–20)
Calcium, Ion: 0.94 mmol/L — ABNORMAL LOW (ref 1.15–1.40)
Chloride: 100 mmol/L (ref 98–111)
Creatinine, Ser: 13.1 mg/dL — ABNORMAL HIGH (ref 0.61–1.24)
Glucose, Bld: 77 mg/dL (ref 70–99)
HCT: 33 % — ABNORMAL LOW (ref 39.0–52.0)
Hemoglobin: 11.2 g/dL — ABNORMAL LOW (ref 13.0–17.0)
Potassium: 4.9 mmol/L (ref 3.5–5.1)
Sodium: 138 mmol/L (ref 135–145)
TCO2: 26 mmol/L (ref 22–32)

## 2019-03-30 MED ORDER — DOXYCYCLINE HYCLATE 100 MG PO CAPS: 100.0000 mg | ORAL_CAPSULE | Freq: Two times a day (BID) | ORAL | 0 refills | Status: DC

## 2019-03-31 ENCOUNTER — Other Ambulatory Visit: Payer: Self-pay

## 2019-03-31 ENCOUNTER — Encounter: Payer: Medicare Other | Admitting: Cardiothoracic Surgery

## 2019-03-31 ENCOUNTER — Ambulatory Visit (HOSPITAL_COMMUNITY)
Admission: RE | Admit: 2019-03-31 | Discharge: 2019-03-31 | Disposition: A | Discharge status: Home / Self Care | Payer: Medicare Other | Source: Ambulatory Visit | Attending: Emergency Medicine | Admitting: Emergency Medicine

## 2019-03-31 DIAGNOSIS — M7989 Other specified soft tissue disorders: Secondary | ICD-10-CM | POA: Diagnosis not present

## 2019-03-31 NOTE — Progress Notes (Signed)
Right upper extremity venous duplex completed. Preliminary results in Chart review CV proc. [place] Pola Furno,RVS 03/31/2019, 2:51 PM

## 2019-04-01 DIAGNOSIS — Z992 Dependence on renal dialysis: Secondary | ICD-10-CM | POA: Diagnosis not present

## 2019-04-01 DIAGNOSIS — E875 Hyperkalemia: Secondary | ICD-10-CM | POA: Diagnosis not present

## 2019-04-01 DIAGNOSIS — D631 Anemia in chronic kidney disease: Secondary | ICD-10-CM | POA: Diagnosis not present

## 2019-04-01 DIAGNOSIS — N2581 Secondary hyperparathyroidism of renal origin: Secondary | ICD-10-CM | POA: Diagnosis not present

## 2019-04-01 DIAGNOSIS — E1122 Type 2 diabetes mellitus with diabetic chronic kidney disease: Secondary | ICD-10-CM | POA: Diagnosis not present

## 2019-04-01 DIAGNOSIS — N186 End stage renal disease: Secondary | ICD-10-CM | POA: Diagnosis not present

## 2019-04-03 DIAGNOSIS — D631 Anemia in chronic kidney disease: Secondary | ICD-10-CM | POA: Diagnosis not present

## 2019-04-03 DIAGNOSIS — N2581 Secondary hyperparathyroidism of renal origin: Secondary | ICD-10-CM | POA: Diagnosis not present

## 2019-04-03 DIAGNOSIS — N186 End stage renal disease: Secondary | ICD-10-CM | POA: Diagnosis not present

## 2019-04-03 DIAGNOSIS — Z992 Dependence on renal dialysis: Secondary | ICD-10-CM | POA: Diagnosis not present

## 2019-04-03 DIAGNOSIS — E875 Hyperkalemia: Secondary | ICD-10-CM | POA: Diagnosis not present

## 2019-04-03 DIAGNOSIS — E1122 Type 2 diabetes mellitus with diabetic chronic kidney disease: Secondary | ICD-10-CM | POA: Diagnosis not present

## 2019-04-06 DIAGNOSIS — Z5181 Encounter for therapeutic drug level monitoring: Secondary | ICD-10-CM | POA: Diagnosis not present

## 2019-04-06 DIAGNOSIS — E875 Hyperkalemia: Secondary | ICD-10-CM | POA: Diagnosis not present

## 2019-04-06 DIAGNOSIS — N186 End stage renal disease: Secondary | ICD-10-CM | POA: Diagnosis not present

## 2019-04-06 DIAGNOSIS — D631 Anemia in chronic kidney disease: Secondary | ICD-10-CM | POA: Diagnosis not present

## 2019-04-06 DIAGNOSIS — Z79899 Other long term (current) drug therapy: Secondary | ICD-10-CM | POA: Diagnosis not present

## 2019-04-06 DIAGNOSIS — N2581 Secondary hyperparathyroidism of renal origin: Secondary | ICD-10-CM | POA: Diagnosis not present

## 2019-04-06 DIAGNOSIS — Z992 Dependence on renal dialysis: Secondary | ICD-10-CM | POA: Diagnosis not present

## 2019-04-06 DIAGNOSIS — E1122 Type 2 diabetes mellitus with diabetic chronic kidney disease: Secondary | ICD-10-CM | POA: Diagnosis not present

## 2019-04-07 ENCOUNTER — Encounter: Payer: Medicare Other | Admitting: Cardiothoracic Surgery

## 2019-04-11 ENCOUNTER — Inpatient Hospital Stay (HOSPITAL_COMMUNITY)
Admission: EM | Admit: 2019-04-11 | Discharge: 2019-04-14 | DRG: 291 | Disposition: A | Discharge status: Home / Self Care | Payer: Medicare Other | Attending: Family Medicine | Admitting: Family Medicine

## 2019-04-11 DIAGNOSIS — I152 Hypertension secondary to endocrine disorders: Secondary | ICD-10-CM | POA: Diagnosis present

## 2019-04-11 DIAGNOSIS — Z888 Allergy status to other drugs, medicaments and biological substances status: Secondary | ICD-10-CM

## 2019-04-11 DIAGNOSIS — Z20828 Contact with and (suspected) exposure to other viral communicable diseases: Secondary | ICD-10-CM | POA: Diagnosis present

## 2019-04-11 DIAGNOSIS — N2581 Secondary hyperparathyroidism of renal origin: Secondary | ICD-10-CM | POA: Diagnosis not present

## 2019-04-11 DIAGNOSIS — Z79899 Other long term (current) drug therapy: Secondary | ICD-10-CM

## 2019-04-11 DIAGNOSIS — Z86711 Personal history of pulmonary embolism: Secondary | ICD-10-CM

## 2019-04-11 DIAGNOSIS — Z9114 Patient's other noncompliance with medication regimen: Secondary | ICD-10-CM

## 2019-04-11 DIAGNOSIS — N186 End stage renal disease: Secondary | ICD-10-CM | POA: Diagnosis not present

## 2019-04-11 DIAGNOSIS — Z886 Allergy status to analgesic agent status: Secondary | ICD-10-CM

## 2019-04-11 DIAGNOSIS — N189 Chronic kidney disease, unspecified: Secondary | ICD-10-CM | POA: Diagnosis present

## 2019-04-11 DIAGNOSIS — I5042 Chronic combined systolic (congestive) and diastolic (congestive) heart failure: Secondary | ICD-10-CM | POA: Diagnosis present

## 2019-04-11 DIAGNOSIS — K219 Gastro-esophageal reflux disease without esophagitis: Secondary | ICD-10-CM | POA: Diagnosis present

## 2019-04-11 DIAGNOSIS — E1159 Type 2 diabetes mellitus with other circulatory complications: Secondary | ICD-10-CM | POA: Diagnosis present

## 2019-04-11 DIAGNOSIS — I2699 Other pulmonary embolism without acute cor pulmonale: Secondary | ICD-10-CM | POA: Diagnosis present

## 2019-04-11 DIAGNOSIS — G8929 Other chronic pain: Secondary | ICD-10-CM | POA: Diagnosis present

## 2019-04-11 DIAGNOSIS — Z992 Dependence on renal dialysis: Secondary | ICD-10-CM

## 2019-04-11 DIAGNOSIS — E8889 Other specified metabolic disorders: Secondary | ICD-10-CM | POA: Diagnosis present

## 2019-04-11 DIAGNOSIS — E8770 Fluid overload, unspecified: Secondary | ICD-10-CM | POA: Diagnosis not present

## 2019-04-11 DIAGNOSIS — E8779 Other fluid overload: Secondary | ICD-10-CM

## 2019-04-11 DIAGNOSIS — Z885 Allergy status to narcotic agent status: Secondary | ICD-10-CM

## 2019-04-11 DIAGNOSIS — R0602 Shortness of breath: Secondary | ICD-10-CM | POA: Diagnosis not present

## 2019-04-11 DIAGNOSIS — Z79891 Long term (current) use of opiate analgesic: Secondary | ICD-10-CM

## 2019-04-11 DIAGNOSIS — I132 Hypertensive heart and chronic kidney disease with heart failure and with stage 5 chronic kidney disease, or end stage renal disease: Secondary | ICD-10-CM | POA: Diagnosis not present

## 2019-04-11 DIAGNOSIS — I10 Essential (primary) hypertension: Secondary | ICD-10-CM | POA: Diagnosis present

## 2019-04-11 DIAGNOSIS — I5022 Chronic systolic (congestive) heart failure: Secondary | ICD-10-CM | POA: Diagnosis present

## 2019-04-11 DIAGNOSIS — D631 Anemia in chronic kidney disease: Secondary | ICD-10-CM | POA: Diagnosis present

## 2019-04-11 DIAGNOSIS — Z8249 Family history of ischemic heart disease and other diseases of the circulatory system: Secondary | ICD-10-CM

## 2019-04-11 DIAGNOSIS — E875 Hyperkalemia: Secondary | ICD-10-CM | POA: Diagnosis present

## 2019-04-11 DIAGNOSIS — E1122 Type 2 diabetes mellitus with diabetic chronic kidney disease: Secondary | ICD-10-CM | POA: Diagnosis present

## 2019-04-11 DIAGNOSIS — K861 Other chronic pancreatitis: Secondary | ICD-10-CM | POA: Diagnosis not present

## 2019-04-11 DIAGNOSIS — Z87891 Personal history of nicotine dependence: Secondary | ICD-10-CM

## 2019-04-11 DIAGNOSIS — F418 Other specified anxiety disorders: Secondary | ICD-10-CM | POA: Diagnosis present

## 2019-04-11 DIAGNOSIS — Z7901 Long term (current) use of anticoagulants: Secondary | ICD-10-CM

## 2019-04-11 DIAGNOSIS — I700 Atherosclerosis of aorta: Secondary | ICD-10-CM | POA: Diagnosis present

## 2019-04-11 DIAGNOSIS — R109 Unspecified abdominal pain: Secondary | ICD-10-CM | POA: Diagnosis present

## 2019-04-11 DIAGNOSIS — R112 Nausea with vomiting, unspecified: Secondary | ICD-10-CM

## 2019-04-11 DIAGNOSIS — K859 Acute pancreatitis without necrosis or infection, unspecified: Secondary | ICD-10-CM

## 2019-04-11 DIAGNOSIS — Z86718 Personal history of other venous thrombosis and embolism: Secondary | ICD-10-CM

## 2019-04-11 DIAGNOSIS — I5023 Acute on chronic systolic (congestive) heart failure: Secondary | ICD-10-CM | POA: Diagnosis present

## 2019-04-11 DIAGNOSIS — Z9115 Patient's noncompliance with renal dialysis: Secondary | ICD-10-CM

## 2019-04-12 ENCOUNTER — Encounter (HOSPITAL_COMMUNITY): Payer: Self-pay | Admitting: Internal Medicine

## 2019-04-12 ENCOUNTER — Emergency Department (HOSPITAL_COMMUNITY): Payer: Medicare Other

## 2019-04-12 ENCOUNTER — Other Ambulatory Visit: Payer: Self-pay

## 2019-04-12 DIAGNOSIS — K861 Other chronic pancreatitis: Secondary | ICD-10-CM

## 2019-04-12 DIAGNOSIS — R109 Unspecified abdominal pain: Secondary | ICD-10-CM | POA: Diagnosis not present

## 2019-04-12 DIAGNOSIS — E875 Hyperkalemia: Secondary | ICD-10-CM | POA: Diagnosis not present

## 2019-04-12 DIAGNOSIS — Z992 Dependence on renal dialysis: Secondary | ICD-10-CM | POA: Diagnosis not present

## 2019-04-12 DIAGNOSIS — I10 Essential (primary) hypertension: Secondary | ICD-10-CM

## 2019-04-12 DIAGNOSIS — Z86718 Personal history of other venous thrombosis and embolism: Secondary | ICD-10-CM | POA: Diagnosis not present

## 2019-04-12 DIAGNOSIS — K859 Acute pancreatitis without necrosis or infection, unspecified: Secondary | ICD-10-CM | POA: Diagnosis not present

## 2019-04-12 DIAGNOSIS — N186 End stage renal disease: Secondary | ICD-10-CM

## 2019-04-12 DIAGNOSIS — R0602 Shortness of breath: Secondary | ICD-10-CM | POA: Diagnosis not present

## 2019-04-12 LAB — CBC WITH DIFFERENTIAL/PLATELET
Abs Immature Granulocytes: 0.02 10*3/uL (ref 0.00–0.07)
Basophils Absolute: 0 10*3/uL (ref 0.0–0.1)
Basophils Relative: 0 %
Eosinophils Absolute: 0.1 10*3/uL (ref 0.0–0.5)
Eosinophils Relative: 3 %
HCT: 33.5 % — ABNORMAL LOW (ref 39.0–52.0)
Hemoglobin: 10.4 g/dL — ABNORMAL LOW (ref 13.0–17.0)
Immature Granulocytes: 0 %
Lymphocytes Relative: 14 %
Lymphs Abs: 0.6 10*3/uL — ABNORMAL LOW (ref 0.7–4.0)
MCH: 27.9 pg (ref 26.0–34.0)
MCHC: 31 g/dL (ref 30.0–36.0)
MCV: 89.8 fL (ref 80.0–100.0)
Monocytes Absolute: 0.6 10*3/uL (ref 0.1–1.0)
Monocytes Relative: 12 %
Neutro Abs: 3.2 10*3/uL (ref 1.7–7.7)
Neutrophils Relative %: 71 %
Platelets: 169 10*3/uL (ref 150–400)
RBC: 3.73 MIL/uL — ABNORMAL LOW (ref 4.22–5.81)
RDW: 16.8 % — ABNORMAL HIGH (ref 11.5–15.5)
WBC: 4.5 10*3/uL (ref 4.0–10.5)
nRBC: 0 % (ref 0.0–0.2)

## 2019-04-12 LAB — COMPREHENSIVE METABOLIC PANEL
ALT: 10 U/L (ref 0–44)
AST: 13 U/L — ABNORMAL LOW (ref 15–41)
Albumin: 3.5 g/dL (ref 3.5–5.0)
Alkaline Phosphatase: 172 U/L — ABNORMAL HIGH (ref 38–126)
Anion gap: 20 — ABNORMAL HIGH (ref 5–15)
BUN: 83 mg/dL — ABNORMAL HIGH (ref 6–20)
CO2: 20 mmol/L — ABNORMAL LOW (ref 22–32)
Calcium: 8.1 mg/dL — ABNORMAL LOW (ref 8.9–10.3)
Chloride: 97 mmol/L — ABNORMAL LOW (ref 98–111)
Creatinine, Ser: 17.32 mg/dL — ABNORMAL HIGH (ref 0.61–1.24)
GFR calc Af Amer: 3 mL/min — ABNORMAL LOW (ref >60)
GFR calc non Af Amer: 3 mL/min — ABNORMAL LOW (ref >60)
Glucose, Bld: 66 mg/dL — ABNORMAL LOW (ref 70–99)
Potassium: 5.7 mmol/L — ABNORMAL HIGH (ref 3.5–5.1)
Sodium: 137 mmol/L (ref 135–145)
Total Bilirubin: 0.7 mg/dL (ref 0.3–1.2)
Total Protein: 8.1 g/dL (ref 6.5–8.1)

## 2019-04-12 LAB — CBG MONITORING, ED
Glucose-Capillary: 84 mg/dL (ref 70–99)
Glucose-Capillary: 95 mg/dL (ref 70–99)
Glucose-Capillary: 89 mg/dL (ref 70–99)

## 2019-04-12 LAB — SARS CORONAVIRUS 2 (TAT 6-24 HRS): SARS Coronavirus 2: NEGATIVE

## 2019-04-12 LAB — LIPASE, BLOOD: Lipase: 79 U/L — ABNORMAL HIGH (ref 11–51)

## 2019-04-12 MED ORDER — PANCRELIPASE (LIP-PROT-AMYL) 12000-38000 UNITS PO CPEP
12000.0000 [IU] | ORAL_CAPSULE | Freq: Three times a day (TID) | ORAL | Status: DC
  Administered 2019-04-12 – 2019-04-13 (×5): 12000 [IU] via ORAL
  Filled 2019-04-12 (×6): qty 1

## 2019-04-12 MED ORDER — DEXTROSE 50 % IV SOLN
50.0000 mL | Freq: Once | INTRAVENOUS | Status: AC
  Administered 2019-04-12: 50 mL via INTRAVENOUS

## 2019-04-12 MED ORDER — HYDROMORPHONE HCL 1 MG/ML IJ SOLN
1.0000 mg | INTRAMUSCULAR | Status: DC | PRN
  Administered 2019-04-12 (×2): 1 mg via INTRAMUSCULAR
  Filled 2019-04-12 (×2): qty 1

## 2019-04-12 MED ORDER — LANTHANUM CARBONATE 500 MG PO CHEW
1000.0000 mg | CHEWABLE_TABLET | Freq: Three times a day (TID) | ORAL | Status: DC
  Administered 2019-04-13 (×3): 1000 mg via ORAL
  Filled 2019-04-12 (×3): qty 2

## 2019-04-12 MED ORDER — PROMETHAZINE HCL 25 MG/ML IJ SOLN: 12.5000 mg | Freq: Four times a day (QID) | INTRAMUSCULAR | Status: DC | PRN

## 2019-04-12 MED ORDER — HYDROXYZINE HCL 10 MG PO TABS: 10.0000 mg | ORAL_TABLET | Freq: Three times a day (TID) | ORAL | Status: DC | PRN

## 2019-04-12 MED ORDER — HYDROMORPHONE HCL 1 MG/ML IJ SOLN
1.0000 mg | Freq: Once | INTRAMUSCULAR | Status: AC
  Administered 2019-04-12: 01:00:00 1 mg via INTRAMUSCULAR
  Filled 2019-04-12: qty 1

## 2019-04-12 MED ORDER — PENTAFLUOROPROP-TETRAFLUOROETH EX AERO: 1.0000 "application " | INHALATION_SPRAY | CUTANEOUS | Status: DC | PRN

## 2019-04-12 MED ORDER — SODIUM CHLORIDE 0.9 % IV SOLN: 100.0000 mL | INTRAVENOUS | Status: DC | PRN

## 2019-04-12 MED ORDER — DEXTROSE 50 % IV SOLN
INTRAVENOUS | Status: AC
  Filled 2019-04-12: qty 50

## 2019-04-12 MED ORDER — AMLODIPINE BESYLATE 5 MG PO TABS
5.0000 mg | ORAL_TABLET | Freq: Every day | ORAL | Status: DC
  Administered 2019-04-12 – 2019-04-13 (×2): 5 mg via ORAL
  Filled 2019-04-12 (×3): qty 1

## 2019-04-12 MED ORDER — PROMETHAZINE HCL 25 MG PO TABS: 25.0000 mg | ORAL_TABLET | ORAL | Status: DC

## 2019-04-12 MED ORDER — HYDROMORPHONE HCL 1 MG/ML IJ SOLN
INTRAMUSCULAR | Status: AC
  Filled 2019-04-12: qty 0.5

## 2019-04-12 MED ORDER — ALTEPLASE 2 MG IJ SOLR: 2.0000 mg | Freq: Once | INTRAMUSCULAR | Status: DC | PRN

## 2019-04-12 MED ORDER — HYDRALAZINE HCL 50 MG PO TABS
100.0000 mg | ORAL_TABLET | Freq: Three times a day (TID) | ORAL | Status: DC
  Administered 2019-04-12 – 2019-04-14 (×7): 100 mg via ORAL
  Filled 2019-04-12 (×9): qty 2

## 2019-04-12 MED ORDER — LIDOCAINE HCL (PF) 1 % IJ SOLN: 5.0000 mL | INTRAMUSCULAR | Status: DC | PRN

## 2019-04-12 MED ORDER — HYDROMORPHONE HCL 1 MG/ML IJ SOLN
0.5000 mg | INTRAMUSCULAR | Status: AC | PRN
  Administered 2019-04-12 – 2019-04-13 (×5): 0.5 mg via INTRAMUSCULAR
  Filled 2019-04-12 (×5): qty 1

## 2019-04-12 MED ORDER — CHLORHEXIDINE GLUCONATE CLOTH 2 % EX PADS: 6.0000 | MEDICATED_PAD | Freq: Every day | CUTANEOUS | Status: DC

## 2019-04-12 MED ORDER — RENA-VITE PO TABS
1.0000 | ORAL_TABLET | Freq: Every day | ORAL | Status: DC
  Administered 2019-04-12 – 2019-04-14 (×3): 1 via ORAL
  Filled 2019-04-12 (×3): qty 1

## 2019-04-12 MED ORDER — NITROGLYCERIN 0.4 MG SL SUBL: 0.4000 mg | SUBLINGUAL_TABLET | SUBLINGUAL | Status: DC | PRN

## 2019-04-12 MED ORDER — PANTOPRAZOLE SODIUM 40 MG PO TBEC
40.0000 mg | DELAYED_RELEASE_TABLET | Freq: Every day | ORAL | Status: DC
  Administered 2019-04-12 – 2019-04-14 (×3): 40 mg via ORAL
  Filled 2019-04-12 (×3): qty 1

## 2019-04-12 MED ORDER — HEPARIN SODIUM (PORCINE) 1000 UNIT/ML DIALYSIS: 1000.0000 [IU] | INTRAMUSCULAR | Status: DC | PRN

## 2019-04-12 MED ORDER — SODIUM THIOSULFATE 25 % IV SOLN
25.0000 g | INTRAVENOUS | Status: DC
  Filled 2019-04-12: qty 100

## 2019-04-12 MED ORDER — LIDOCAINE-PRILOCAINE 2.5-2.5 % EX CREA: 1.0000 "application " | TOPICAL_CREAM | CUTANEOUS | Status: DC | PRN

## 2019-04-12 MED ORDER — OXYCODONE HCL 5 MG PO TABS
10.0000 mg | ORAL_TABLET | Freq: Four times a day (QID) | ORAL | Status: DC | PRN
  Administered 2019-04-13 – 2019-04-14 (×4): 10 mg via ORAL
  Filled 2019-04-12 (×4): qty 2

## 2019-04-12 MED ORDER — LISINOPRIL 5 MG PO TABS
5.0000 mg | ORAL_TABLET | Freq: Every day | ORAL | Status: DC
  Administered 2019-04-12 – 2019-04-13 (×2): 5 mg via ORAL
  Filled 2019-04-12 (×2): qty 1

## 2019-04-12 MED ORDER — HYDRALAZINE HCL 25 MG PO TABS: 25.0000 mg | ORAL_TABLET | Freq: Three times a day (TID) | ORAL | Status: DC | PRN

## 2019-04-12 MED ORDER — CYCLOBENZAPRINE HCL 10 MG PO TABS
10.0000 mg | ORAL_TABLET | Freq: Three times a day (TID) | ORAL | Status: DC
  Administered 2019-04-12 – 2019-04-14 (×6): 10 mg via ORAL
  Filled 2019-04-12 (×6): qty 1

## 2019-04-12 MED ORDER — SENNOSIDES-DOCUSATE SODIUM 8.6-50 MG PO TABS
2.0000 | ORAL_TABLET | Freq: Every day | ORAL | Status: DC
  Filled 2019-04-12 (×2): qty 2

## 2019-04-12 MED ORDER — HYDROXYZINE HCL 10 MG PO TABS
10.0000 mg | ORAL_TABLET | Freq: Three times a day (TID) | ORAL | Status: DC | PRN
  Filled 2019-04-12: qty 1

## 2019-04-12 MED ORDER — APIXABAN 5 MG PO TABS
5.0000 mg | ORAL_TABLET | Freq: Two times a day (BID) | ORAL | Status: DC
  Administered 2019-04-12 – 2019-04-14 (×5): 5 mg via ORAL
  Filled 2019-04-12 (×6): qty 1

## 2019-04-12 MED ORDER — PROMETHAZINE HCL 25 MG/ML IJ SOLN
25.0000 mg | Freq: Once | INTRAMUSCULAR | Status: DC
  Administered 2019-04-12: 25 mg via INTRAMUSCULAR
  Filled 2019-04-12: qty 1

## 2019-04-12 MED ORDER — SODIUM ZIRCONIUM CYCLOSILICATE 10 G PO PACK
10.0000 g | PACK | Freq: Once | ORAL | Status: AC
  Administered 2019-04-12: 05:00:00 10 g via ORAL
  Filled 2019-04-12: qty 1

## 2019-04-12 MED ORDER — DEXTROSE 50 % IV SOLN: 50.0000 mL | INTRAVENOUS | Status: DC | PRN

## 2019-04-12 NOTE — ED Triage Notes (Signed)
Pt from home here because of N/V, generalized pain on right side, and missed dialysis. Patient attends dialysis T,TH,Sat and missed his Saturday session.
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
## 2019-04-12 NOTE — Progress Notes (Signed)
New Admission Note:   Arrival Method: wheelchair  Mental Orientation: Alert and oriented  Telemetry: Box 13 Assessment: Completed Skin: [id number] hand  Pain: 7/10  Tubes: none  Safety Measures: Safety Fall Prevention Plan has been given, discussed and signed Admission: Completed [facility] Orientation: Patient has been orientated to the room, unit and staff.  Family: None   Orders have been reviewed and implemented. Will continue to monitor the patient. Call light has been placed within reach and bed alarm has been activated.   Ellyce Lafevers RN [facility] Phone: [phone]
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
## 2019-04-12 NOTE — Plan of Care (Signed)
  Problem: Health Behavior/Discharge Planning: Goal: Ability to manage health-related needs will improve Outcome: Progressing

## 2019-04-12 NOTE — Progress Notes (Signed)
Patient states he does not want me to look at his skin at the time, maybe later.

## 2019-04-12 NOTE — ED Notes (Signed)
Patient refusing the Covid swab test to be taken now and demanding to be taken prior to transport to assigned room.

## 2019-04-12 NOTE — Progress Notes (Signed)
PROGRESS NOTE  Frank [name] XBL:[id number] DOB: 25-Apr-1966 DOA: 04/11/2019 PCP: Patient, [name]  HPI/Recap of past 24 hours: Frank [name] is a 53 y.o. male with medical history significant of HTN, diet-controlled DM, depression with anxiety, DVT/PE on Eliquis, ESRD-HD (TTS), CHF with EF 35-40%, chronic pancreatitis, chronic abdominal pain, who presents with nausea vomiting and abdominal pain for the past 2 days which is located in right side of abdomen, epigastric area, constant, sharp, 10 out of 10 in severity, nonradiating.  It is associated with nausea, vomiting, no diarrhea.  Of note, patient has missed about 2 sessions of hemodialysis.  In the ED, pt was found to have lipase 79, elevated blood pressure 220/109. Chest x-ray showed vascular congestion and interstitial pulmonary edema.  Patient admitted for further management.  Neurology consulted.    Today, patient still complained of abdominal pain, denies any further nausea/vomiting or diarrhea.  Patient insisting on a regular diet, recommended clear liquids for now, refusing insisting on a meal, otherwise he is not going to have dialysis all lab draws.  Assessment/Plan: Principal Problem:   Recurrent abdominal pain Active Problems:   Anemia of chronic kidney failure   Essential hypertension   Chronic systolic heart failure (HCC)   GERD (gastroesophageal reflux disease)   History of DVT (deep vein thrombosis)   End-stage renal disease on hemodialysis (HCC)   PE (pulmonary thromboembolism) (HCC)   Hyperkalemia   Chronic pancreatitis (HCC)  Recurrent abdominal pain/chronic pancreatitis Lipase is slightly elevated at 59 Eager to eat Continue home PRN oxycodone, PRN IV Dilaudid Start Creon No IVF due to ESRD and missed HD Advance to soft diet, as patient is insisting If persistent pain, will order CT abdomen/pelvis  End-stage renal disease on hemodialysis T/T/S  Nephrology on board  Hyperkalemia 10 g of Lokelma was  ordered by ED physician Nephrology on board, dialysis  Anemia of chronic kidney failure Hgb stable at baseline  Essential hypertension Uncontrolled Likely due to medication noncompliance Continue home medications: Amlodipine, hydralazine, lisinopril  Chronic systolic heart failure 2D echo on 11/10/2017 showed EF of 35-40% Volume management per renal by dialysis  GERD Protonix  Hx of PE and DVT  Continue Eliquis           Malnutrition Type:      Malnutrition Characteristics:      Nutrition Interventions:       Estimated body mass index is 21.83 kg/m as calculated from the following:   Height as of this encounter: _0  (1.88 m).   Weight as of this encounter: 77.1 kg.     Code Status: Full  Family Communication: None at bedside  Disposition Plan: To be determined, likely home   Consultants:  Nephrology  Procedures:  None  Antimicrobials:  None  DVT prophylaxis: Eliquis   Objective: Vitals:   04/12/19 1300 04/12/19 1315 04/12/19 1320 04/12/19 1330  BP:   (!) 168/116   Pulse:      Resp: _1 Temp:      TempSrc:      SpO2:      Weight:      Height:       No intake or output data in the 24 hours ending 04/12/19 1642 Filed Weights   04/12/19 0017  Weight: 77.1 kg    Exam:  General: NAD   Cardiovascular: S1, S2 present  Respiratory: CTAB  Abdomen: Soft, +tender, nondistended, bowel sounds present  Musculoskeletal: No bilateral pedal edema noted  Skin: Normal  Psychiatry: Normal mood   Data Reviewed: CBC: Recent Labs  Lab 04/12/19 0054  WBC 4.5  NEUTROABS 3.2  HGB 10.4*  HCT 33.5*  MCV 89.8  PLT 932   Basic Metabolic Panel: Recent Labs  Lab 04/12/19 0054  NA 137  K 5.7*  CL 97*  CO2 20*  GLUCOSE 66*  BUN 83*  CREATININE 17.32*  CALCIUM 8.1*   GFR: Estimated Creatinine Clearance: 5.3 mL/min (A) (by C-G formula based on SCr of 17.32 mg/dL (H)). Liver Function Tests: Recent Labs   Lab 04/12/19 0054  AST 13*  ALT 10  ALKPHOS 172*  BILITOT 0.7  PROT 8.1  ALBUMIN 3.5   Recent Labs  Lab 04/12/19 0054  LIPASE 79*   No results for input(s): AMMONIA in the last 168 hours. Coagulation Profile: No results for input(s): INR, PROTIME in the last 168 hours. Cardiac Enzymes: No results for input(s): CKTOTAL, CKMB, CKMBINDEX, TROPONINI in the last 168 hours. BNP (last 3 results) No results for input(s): PROBNP in the last 8760 hours. HbA1C: No results for input(s): HGBA1C in the last 72 hours. CBG: Recent Labs  Lab 04/12/19 0348 04/12/19 0657 04/12/19 1319  GLUCAP 84 95 89   Lipid Profile: No results for input(s): CHOL, HDL, LDLCALC, TRIG, CHOLHDL, LDLDIRECT in the last 72 hours. Thyroid Function Tests: No results for input(s): TSH, T4TOTAL, FREET4, T3FREE, THYROIDAB in the last 72 hours. Anemia Panel: No results for input(s): VITAMINB12, FOLATE, FERRITIN, TIBC, IRON, RETICCTPCT in the last 72 hours. Urine analysis:    Component Value Date/Time   COLORURINE YELLOW 10/18/2013 [place] 10/18/2013 0419   LABSPEC 1.008 10/18/2013 0419   PHURINE 8.5 (H) 10/18/2013 0419   GLUCOSEU 100 (A) 10/18/2013 0419   HGBUR TRACE (A) 10/18/2013 0419   BILIRUBINUR NEGATIVE 10/18/2013 0419   KETONESUR NEGATIVE 10/18/2013 0419   PROTEINUR 100 (A) 10/18/2013 0419   UROBILINOGEN 0.2 10/18/2013 0419   NITRITE NEGATIVE 10/18/2013 0419   LEUKOCYTESUR NEGATIVE 10/18/2013 0419   Sepsis Labs: _0 (procalcitonin:4,lacticidven:4)  ) Recent Results (from the past 240 hour(s))  SARS CORONAVIRUS 2 (TAT 6-24 HRS) Nasopharyngeal Nasopharyngeal Swab     Status: None   Collection Time: 04/12/19  5:59 AM   Specimen: Nasopharyngeal Swab  Result Value Ref Range Status   SARS Coronavirus 2 NEGATIVE NEGATIVE Final    Comment: (NOTE) SARS-CoV-2 target nucleic acids are NOT DETECTED. The SARS-CoV-2 RNA is generally detectable in upper and lower respiratory  specimens during the acute phase of infection. Negative results do not preclude SARS-CoV-2 infection, do not rule out co-infections with other pathogens, and should not be used as the sole basis for treatment or other patient management decisions. Negative results must be combined with clinical observations, patient history, and epidemiological information. The expected result is Negative. Fact Sheet for Patients: SugarRoll.be Fact Sheet for Healthcare Providers: https://www.woods-mathews.com/ This test is not yet approved or cleared by the [place] FDA and  has been authorized for detection and/or diagnosis of SARS-CoV-2 by FDA under an Emergency Use Authorization (EUA). This EUA will remain  in effect (meaning this test can be used) for the duration of the COVID-19 declaration under Section 56 4(b)(1) of the Act, 21 U.S.C. section 360bbb-3(b)(1), unless the authorization is terminated or revoked sooner. Performed at [facility], [place] [address], [place], [place] [address]       Studies: Dg Chest Port 1 View  Result Date: 04/12/2019 CLINICAL DATA:  Shortness of breath  EXAM: PORTABLE CHEST 1 VIEW COMPARISON:  03/23/2019, 03/19/2019, CT chest 08/28/2018 FINDINGS: Moderate cardiomegaly with central vascular congestion and mild interstitial pulmonary edema. Moderate right pleural effusion with loculated appearance at the right base. Similar airspace disease at the right base. Convex contour. No pneumothorax. IMPRESSION: 1. Cardiomegaly with vascular congestion and mild diffuse interstitial pulmonary edema 2. No significant change in loculated appearing moderate right pleural effusion with persistent airspace disease and possible mass at the right lung base. Electronically Signed   By: Donavan Foil M.D.   On: 04/12/2019 01:07    Scheduled Meds: . amLODipine  5 mg Oral Daily  . apixaban  5 mg Oral BID  . Chlorhexidine Gluconate Cloth  6  each Topical Q0600  . cyclobenzaprine  10 mg Oral TID  . dextrose      . hydrALAZINE  100 mg Oral TID  . lanthanum  1,000 mg Oral TID WC  . lipase/protease/amylase  12,000 Units Oral TID AC  . lisinopril  5 mg Oral Daily  . multivitamin  1 tablet Oral Daily  . pantoprazole  40 mg Oral Daily  . senna-docusate  2 tablet Oral QHS    Continuous Infusions: . [START ON 04/13/2019] sodium thiosulfate infusion for calciphylaxis       LOS: 0 days     Alma Friendly, MD [facility]  If 7PM-7AM, please contact night-coverage [email or web address] 04/12/2019, 4:42 PM

## 2019-04-12 NOTE — ED Notes (Signed)
Patient is refusing morning blood work to be done
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
## 2019-04-13 ENCOUNTER — Encounter (HOSPITAL_COMMUNITY): Payer: Self-pay | Admitting: *Deleted

## 2019-04-13 DIAGNOSIS — Z992 Dependence on renal dialysis: Secondary | ICD-10-CM | POA: Diagnosis not present

## 2019-04-13 DIAGNOSIS — N186 End stage renal disease: Secondary | ICD-10-CM | POA: Diagnosis not present

## 2019-04-13 DIAGNOSIS — Z886 Allergy status to analgesic agent status: Secondary | ICD-10-CM | POA: Diagnosis not present

## 2019-04-13 DIAGNOSIS — I5042 Chronic combined systolic (congestive) and diastolic (congestive) heart failure: Secondary | ICD-10-CM | POA: Diagnosis present

## 2019-04-13 DIAGNOSIS — I700 Atherosclerosis of aorta: Secondary | ICD-10-CM | POA: Diagnosis present

## 2019-04-13 DIAGNOSIS — Z86718 Personal history of other venous thrombosis and embolism: Secondary | ICD-10-CM | POA: Diagnosis not present

## 2019-04-13 DIAGNOSIS — N189 Chronic kidney disease, unspecified: Secondary | ICD-10-CM

## 2019-04-13 DIAGNOSIS — Z885 Allergy status to narcotic agent status: Secondary | ICD-10-CM | POA: Diagnosis not present

## 2019-04-13 DIAGNOSIS — I120 Hypertensive chronic kidney disease with stage 5 chronic kidney disease or end stage renal disease: Secondary | ICD-10-CM | POA: Diagnosis not present

## 2019-04-13 DIAGNOSIS — Z79899 Other long term (current) drug therapy: Secondary | ICD-10-CM | POA: Diagnosis not present

## 2019-04-13 DIAGNOSIS — Z7901 Long term (current) use of anticoagulants: Secondary | ICD-10-CM | POA: Diagnosis not present

## 2019-04-13 DIAGNOSIS — I132 Hypertensive heart and chronic kidney disease with heart failure and with stage 5 chronic kidney disease, or end stage renal disease: Secondary | ICD-10-CM | POA: Diagnosis present

## 2019-04-13 DIAGNOSIS — D631 Anemia in chronic kidney disease: Secondary | ICD-10-CM | POA: Diagnosis not present

## 2019-04-13 DIAGNOSIS — K861 Other chronic pancreatitis: Secondary | ICD-10-CM | POA: Diagnosis not present

## 2019-04-13 DIAGNOSIS — N2581 Secondary hyperparathyroidism of renal origin: Secondary | ICD-10-CM | POA: Diagnosis not present

## 2019-04-13 DIAGNOSIS — Z87891 Personal history of nicotine dependence: Secondary | ICD-10-CM | POA: Diagnosis not present

## 2019-04-13 DIAGNOSIS — E1122 Type 2 diabetes mellitus with diabetic chronic kidney disease: Secondary | ICD-10-CM | POA: Diagnosis present

## 2019-04-13 DIAGNOSIS — F418 Other specified anxiety disorders: Secondary | ICD-10-CM | POA: Diagnosis present

## 2019-04-13 DIAGNOSIS — I10 Essential (primary) hypertension: Secondary | ICD-10-CM | POA: Diagnosis not present

## 2019-04-13 DIAGNOSIS — E8889 Other specified metabolic disorders: Secondary | ICD-10-CM | POA: Diagnosis present

## 2019-04-13 DIAGNOSIS — Z888 Allergy status to other drugs, medicaments and biological substances status: Secondary | ICD-10-CM | POA: Diagnosis not present

## 2019-04-13 DIAGNOSIS — E875 Hyperkalemia: Secondary | ICD-10-CM | POA: Diagnosis present

## 2019-04-13 DIAGNOSIS — K219 Gastro-esophageal reflux disease without esophagitis: Secondary | ICD-10-CM | POA: Diagnosis present

## 2019-04-13 DIAGNOSIS — G8929 Other chronic pain: Secondary | ICD-10-CM | POA: Diagnosis present

## 2019-04-13 DIAGNOSIS — Z86711 Personal history of pulmonary embolism: Secondary | ICD-10-CM | POA: Diagnosis not present

## 2019-04-13 DIAGNOSIS — Z9115 Patient's noncompliance with renal dialysis: Secondary | ICD-10-CM | POA: Diagnosis not present

## 2019-04-13 DIAGNOSIS — E872 Acidosis: Secondary | ICD-10-CM | POA: Diagnosis not present

## 2019-04-13 DIAGNOSIS — I5022 Chronic systolic (congestive) heart failure: Secondary | ICD-10-CM | POA: Diagnosis not present

## 2019-04-13 DIAGNOSIS — R109 Unspecified abdominal pain: Secondary | ICD-10-CM | POA: Diagnosis not present

## 2019-04-13 DIAGNOSIS — Z20828 Contact with and (suspected) exposure to other viral communicable diseases: Secondary | ICD-10-CM | POA: Diagnosis present

## 2019-04-13 MED ORDER — HYDROMORPHONE HCL 1 MG/ML IJ SOLN
0.5000 mg | Freq: Four times a day (QID) | INTRAMUSCULAR | Status: DC | PRN
  Administered 2019-04-13 – 2019-04-14 (×4): 0.5 mg via INTRAVENOUS
  Filled 2019-04-13 (×2): qty 1

## 2019-04-13 NOTE — Progress Notes (Addendum)
[facility] Progress Note   Subjective:  Completed HD last night with net UF 4L. SOB improved. For HD today.  Ate some breakfast this am but still endorsing abd pain/N/V  Objective Vitals:   04/12/19 2230 04/12/19 2300 04/12/19 2358 04/13/19 0537  BP: (!) 162/71 (!) 170/61 (!) 170/71 (!) 198/86  Pulse: 77 78 73 81  Resp:  _0 Temp:  98.2 F (36.8 C)  (!) 97.5 F (36.4 C)  TempSrc:  Oral  Oral  SpO2:  96% 97% 97%  Weight:  72.2 kg    Height:        Physical Exam General: Ill appearing NAD  Heart: RRR No m,r,g  Lungs: CTAB  Abdomen: +diffuse non-localized tenderness +guarding  Extremities: No LE edema; dry scaly skin  Dialysis Access: LUE AVF +bruit    Weight change: -0.911 kg   Additional Objective Labs: Basic Metabolic Panel: Recent Labs  Lab 04/12/19 0054  NA 137  K 5.7*  CL 97*  CO2 20*  GLUCOSE 66*  BUN 83*  CREATININE 17.32*  CALCIUM 8.1*   CBC: Recent Labs  Lab 04/12/19 0054  WBC 4.5  NEUTROABS 3.2  HGB 10.4*  HCT 33.5*  MCV 89.8  PLT 169   Blood Culture    Component Value Date/Time   SDES PLEURAL 07/29/2018 1300   SDES PLEURAL 07/29/2018 1300   SPECREQUEST NONE 07/29/2018 1300   SPECREQUEST NONE 07/29/2018 1300   CULT  07/29/2018 1300    NO GROWTH 5 DAYS Performed at [facility], [place] [address], [place], [place] [address]    REPTSTATUS 08/03/2018 FINAL 07/29/2018 1300   REPTSTATUS 07/29/2018 FINAL 07/29/2018 1300     Medications: . sodium thiosulfate infusion for calciphylaxis     . amLODipine  5 mg Oral Daily  . apixaban  5 mg Oral BID  . Chlorhexidine Gluconate Cloth  6 each Topical Q0600  . cyclobenzaprine  10 mg Oral TID  . hydrALAZINE  100 mg Oral TID  . lanthanum  1,000 mg Oral TID WC  . lipase/protease/amylase  12,000 Units Oral TID AC  . lisinopril  5 mg Oral Daily  . multivitamin  1 tablet Oral Daily  . pantoprazole  40 mg Oral Daily  . senna-docusate  2 tablet Oral QHS    Dialysis  Orders:  [place] [facility] 4h 400/800 71.5kg 2K/2.25Ca (Change to 2Ca bath at discharge) No heparin Parsabiv 2.5 TIW Mircera 150 mcg q 2 weeks (last 9/15) Sodium thiosulfate 25 q HD   Assessment/Plan: 1.  Acute on chronic abdominal pain/N/V - Hx chronic pancreatitis. Lipase 79 on admission. Per primary.  2. ESRD -  HD TTS. Extra HD Monday. For HD on schedule today. Recheck K+  3. Hypertension/volume  - Remains hypertensive. Continue home meds. May need to titrate further.  Amlodipine, hydralazine, lisiniopril. Net UF 4L with HD yesterday. Post HD wt 72.2kg. Continue to challenge UF as tolerated  4. Anemia  - Hgb 10.4. On ESA as outpatient. Follow trends.  5. Metabolic bone disease -  Ca ok. On Parabiv as OP but not on formulary here.  Resume binders when eating.  6. Hx Calciphylaxis (R arm wound - healed). On sodium thiosulfate with HD. Use 2Ca bath.  7. Hx DVT/PE on Eliquis    Ogechi Larina Earthly PA-C [facility] Pager [phone] 04/13/2019,9:19 AM  LOS: 0 days    Nephrology attending: Patient was seen and examined at bedside.  Chart reviewed.  I agree  with assessment and plan as outlined above. Tolerated dialysis well yesterday with 4 L ultrafiltration.  He looks much better today.  Mild hyperkalemia noticed.  Plan for dialysis today per TTS schedule.  2K bath.  Resume home antihypertensive medication.  Continue sodium thiosulfate for calciphylaxis treatment.  Okay to discharge home after dialysis today per renal perspective.  Lawson Radar, MD [facility].

## 2019-04-13 NOTE — Plan of Care (Signed)
  Problem: Activity: Goal: Risk for activity intolerance will decrease Outcome: Completed/Met   Problem: Coping: Goal: Level of anxiety will decrease Outcome: Completed/Met   Problem: Nutritional: Goal: Ability to make healthy dietary choices will improve Outcome: Completed/Met

## 2019-04-13 NOTE — Progress Notes (Signed)
PROGRESS NOTE  Frank [name] OTL:[id number] DOB: 04-14-1966 DOA: 04/11/2019 PCP: Patient, [name]  HPI/Recap of past 24 hours: Frank [name] is a 53 y.o. male with medical history significant of HTN, diet-controlled DM, depression with anxiety, DVT/PE on Eliquis, ESRD-HD (TTS), CHF with EF 35-40%, chronic pancreatitis, chronic abdominal pain, who presents with nausea vomiting and abdominal pain for the past 2 days which is located in right side of abdomen, epigastric area, constant, sharp, 10 out of 10 in severity, nonradiating.  It is associated with nausea, vomiting, no diarrhea.  Of note, patient has missed about 2 sessions of hemodialysis.  In the ED, pt was found to have lipase 79, elevated blood pressure 220/109. Chest x-ray showed vascular congestion and interstitial pulmonary edema.  Patient admitted for further management.  Neurology consulted.    Today, patient still complaining of abdominal pain requiring IV Dilaudid, complained of some nausea/vomiting.  Still insisting that he wants to eat.  Refusing lab draws  Assessment/Plan: Principal Problem:   Recurrent abdominal pain Active Problems:   Anemia of chronic kidney failure   Essential hypertension   Chronic systolic heart failure (HCC)   GERD (gastroesophageal reflux disease)   History of DVT (deep vein thrombosis)   End-stage renal disease on hemodialysis (HCC)   PE (pulmonary thromboembolism) (HCC)   Hyperkalemia   Chronic pancreatitis (HCC)  Recurrent abdominal pain/chronic pancreatitis Lipase is slightly elevated at 79 Continue home PRN oxycodone, PRN IV Dilaudid Start Creon No IVF due to ESRD and missed HD Advance to soft diet, as patient is insisting If worsening pain, will order CT abdomen/pelvis  End-stage renal disease on hemodialysis T/T/S  Nephrology on board, plan for another HD session tonight  Hyperkalemia 10 g of Lokelma was ordered by ED physician on admission Nephrology on board, dialysis   Anemia of chronic kidney failure Hgb stable at baseline  Essential hypertension Uncontrolled Likely due to medication noncompliance Continue home medications: Amlodipine, hydralazine, lisinopril  Chronic systolic heart failure 2D echo on 11/10/2017 showed EF of 35-40% Volume management per renal by dialysis  GERD Protonix  Hx of PE and DVT  Continue Eliquis           Malnutrition Type:      Malnutrition Characteristics:      Nutrition Interventions:       Estimated body mass index is 20.44 kg/m as calculated from the following:   Height as of this encounter: _0  (1.88 m).   Weight as of this encounter: 72.2 kg.     Code Status: Full  Family Communication: None at bedside  Disposition Plan: To be determined, likely home on 04/14/2019   Consultants:  Nephrology  Procedures:  None  Antimicrobials:  None  DVT prophylaxis: Eliquis   Objective: Vitals:   04/12/19 2230 04/12/19 2300 04/12/19 2358 04/13/19 0537  BP: (!) 162/71 (!) 170/61 (!) 170/71 (!) 198/86  Pulse: 77 78 73 81  Resp:  _1 Temp:  98.2 F (36.8 C)  (!) 97.5 F (36.4 C)  TempSrc:  Oral  Oral  SpO2:  96% 97% 97%  Weight:  72.2 kg    Height:        Intake/Output Summary (Last 24 hours) at 04/13/2019 1754 Last data filed at 04/13/2019 1300 Gross per 24 hour  Intake 600 ml  Output 4000 ml  Net -3400 ml   Filed Weights   04/12/19 0017 04/12/19 1841 04/12/19 2300  Weight: 77.1 kg 76.2 kg 72.2 kg  Exam:  General: NAD   Cardiovascular: S1, S2 present  Respiratory: CTAB  Abdomen: Soft, +tender, nondistended, bowel sounds present  Musculoskeletal: No bilateral pedal edema noted  Skin: Normal  Psychiatry: Normal mood    Data Reviewed: CBC: Recent Labs  Lab 04/12/19 0054  WBC 4.5  NEUTROABS 3.2  HGB 10.4*  HCT 33.5*  MCV 89.8  PLT 903   Basic Metabolic Panel: Recent Labs  Lab 04/12/19 0054  NA 137  K 5.7*  CL 97*  CO2 20*   GLUCOSE 66*  BUN 83*  CREATININE 17.32*  CALCIUM 8.1*   GFR: Estimated Creatinine Clearance: 4.9 mL/min (A) (by C-G formula based on SCr of 17.32 mg/dL (H)). Liver Function Tests: Recent Labs  Lab 04/12/19 0054  AST 13*  ALT 10  ALKPHOS 172*  BILITOT 0.7  PROT 8.1  ALBUMIN 3.5   Recent Labs  Lab 04/12/19 0054  LIPASE 79*   No results for input(s): AMMONIA in the last 168 hours. Coagulation Profile: No results for input(s): INR, PROTIME in the last 168 hours. Cardiac Enzymes: No results for input(s): CKTOTAL, CKMB, CKMBINDEX, TROPONINI in the last 168 hours. BNP (last 3 results) No results for input(s): PROBNP in the last 8760 hours. HbA1C: No results for input(s): HGBA1C in the last 72 hours. CBG: Recent Labs  Lab 04/12/19 0348 04/12/19 0657 04/12/19 1319  GLUCAP 84 95 89   Lipid Profile: No results for input(s): CHOL, HDL, LDLCALC, TRIG, CHOLHDL, LDLDIRECT in the last 72 hours. Thyroid Function Tests: No results for input(s): TSH, T4TOTAL, FREET4, T3FREE, THYROIDAB in the last 72 hours. Anemia Panel: No results for input(s): VITAMINB12, FOLATE, FERRITIN, TIBC, IRON, RETICCTPCT in the last 72 hours. Urine analysis:    Component Value Date/Time   COLORURINE YELLOW 10/18/2013 [place] 10/18/2013 0419   LABSPEC 1.008 10/18/2013 0419   PHURINE 8.5 (H) 10/18/2013 0419   GLUCOSEU 100 (A) 10/18/2013 0419   HGBUR TRACE (A) 10/18/2013 0419   BILIRUBINUR NEGATIVE 10/18/2013 0419   KETONESUR NEGATIVE 10/18/2013 0419   PROTEINUR 100 (A) 10/18/2013 0419   UROBILINOGEN 0.2 10/18/2013 0419   NITRITE NEGATIVE 10/18/2013 0419   LEUKOCYTESUR NEGATIVE 10/18/2013 0419   Sepsis Labs: _0 (procalcitonin:4,lacticidven:4)  ) Recent Results (from the past 240 hour(s))  SARS CORONAVIRUS 2 (TAT 6-24 HRS) Nasopharyngeal Nasopharyngeal Swab     Status: None   Collection Time: 04/12/19  5:59 AM   Specimen: Nasopharyngeal Swab  Result Value Ref Range  Status   SARS Coronavirus 2 NEGATIVE NEGATIVE Final    Comment: (NOTE) SARS-CoV-2 target nucleic acids are NOT DETECTED. The SARS-CoV-2 RNA is generally detectable in upper and lower respiratory specimens during the acute phase of infection. Negative results do not preclude SARS-CoV-2 infection, do not rule out co-infections with other pathogens, and should not be used as the sole basis for treatment or other patient management decisions. Negative results must be combined with clinical observations, patient history, and epidemiological information. The expected result is Negative. Fact Sheet for Patients: SugarRoll.be Fact Sheet for Healthcare Providers: https://www.woods-mathews.com/ This test is not yet approved or cleared by the [place] FDA and  has been authorized for detection and/or diagnosis of SARS-CoV-2 by FDA under an Emergency Use Authorization (EUA). This EUA will remain  in effect (meaning this test can be used) for the duration of the COVID-19 declaration under Section 56 4(b)(1) of the Act, 21 U.S.C. section 360bbb-3(b)(1), unless the authorization is terminated or revoked sooner. Performed at [facility], [address] [place], [place] [address]       Studies: No results found.  Scheduled Meds: . amLODipine  5 mg Oral Daily  . apixaban  5 mg Oral BID  . Chlorhexidine Gluconate Cloth  6 each Topical Q0600  . cyclobenzaprine  10 mg Oral TID  . hydrALAZINE  100 mg Oral TID  . lanthanum  1,000 mg Oral TID WC  . lipase/protease/amylase  12,000 Units Oral TID AC  . lisinopril  5 mg Oral Daily  . multivitamin  1 tablet Oral Daily  . pantoprazole  40 mg Oral Daily  . senna-docusate  2 tablet Oral QHS    Continuous Infusions: . sodium thiosulfate infusion for calciphylaxis       LOS: 0 days     Alma Friendly, MD [facility]  If 7PM-7AM, please contact night-coverage [email or web address]  04/13/2019, 5:54 PM

## 2019-04-13 NOTE — Discharge Instructions (Signed)
Information on my medicine - ELIQUIS® (apixaban) ° °Why was Eliquis® prescribed for you? °Eliquis® was prescribed to treat blood clots that may have been found in the veins of your legs (deep vein thrombosis) or in your lungs (pulmonary embolism) and to reduce the risk of them occurring again. ° °What do You need to know about Eliquis® ? °The  dose is  ONE 5 mg tablet taken TWICE daily.  Eliquis® may be taken with or without food.  ° °Try to take the dose about the same time in the morning and in the evening. If you have difficulty swallowing the tablet whole please discuss with your pharmacist how to take the medication safely. ° °Take Eliquis® exactly as prescribed and DO NOT stop taking Eliquis® without talking to the doctor who prescribed the medication.  Stopping may increase your risk of developing a new blood clot.  Refill your prescription before you run out. ° °After discharge, you should have regular check-up appointments with your healthcare provider that is prescribing your Eliquis®. °   °What do you do if you miss a dose? °If a dose of ELIQUIS® is not taken at the scheduled time, take it as soon as possible on the same day and twice-daily administration should be resumed. The dose should not be doubled to make up for a missed dose. ° °Important Safety Information °A possible side effect of Eliquis® is bleeding. You should call your healthcare provider right away if you experience any of the following: °? Bleeding from an injury or your nose that does not stop. °? Unusual colored urine (red or dark brown) or unusual colored stools (red or black). °? Unusual bruising for unknown reasons. °? A serious fall or if you hit your head (even if there is no bleeding). ° °Some medicines may interact with Eliquis® and might increase your risk of bleeding or clotting while on Eliquis®. To help avoid this, consult your healthcare provider or pharmacist prior to using any new prescription or non-prescription  medications, including herbals, vitamins, non-steroidal anti-inflammatory drugs (NSAIDs) and supplements. ° °This website has more information on Eliquis® (apixaban): http://www.eliquis.com/eliquis/home ° ° °

## 2019-04-14 LAB — BASIC METABOLIC PANEL
Anion gap: 12 (ref 5–15)
BUN: 44 mg/dL — ABNORMAL HIGH (ref 6–20)
CO2: 25 mmol/L (ref 22–32)
Calcium: 7.9 mg/dL — ABNORMAL LOW (ref 8.9–10.3)
Chloride: 98 mmol/L (ref 98–111)
Creatinine, Ser: 13.04 mg/dL — ABNORMAL HIGH (ref 0.61–1.24)
GFR calc Af Amer: 4 mL/min — ABNORMAL LOW (ref >60)
GFR calc non Af Amer: 4 mL/min — ABNORMAL LOW (ref >60)
Glucose, Bld: 137 mg/dL — ABNORMAL HIGH (ref 70–99)
Potassium: 4.4 mmol/L (ref 3.5–5.1)
Sodium: 135 mmol/L (ref 135–145)

## 2019-04-14 LAB — CBC WITH DIFFERENTIAL/PLATELET
Abs Immature Granulocytes: 0.03 10*3/uL (ref 0.00–0.07)
Basophils Absolute: 0 10*3/uL (ref 0.0–0.1)
Basophils Relative: 1 %
Eosinophils Absolute: 0.2 10*3/uL (ref 0.0–0.5)
Eosinophils Relative: 4 %
HCT: 32 % — ABNORMAL LOW (ref 39.0–52.0)
Hemoglobin: 10.4 g/dL — ABNORMAL LOW (ref 13.0–17.0)
Immature Granulocytes: 1 %
Lymphocytes Relative: 13 %
Lymphs Abs: 0.5 10*3/uL — ABNORMAL LOW (ref 0.7–4.0)
MCH: 28.7 pg (ref 26.0–34.0)
MCHC: 32.5 g/dL (ref 30.0–36.0)
MCV: 88.2 fL (ref 80.0–100.0)
Monocytes Absolute: 0.6 10*3/uL (ref 0.1–1.0)
Monocytes Relative: 14 %
Neutro Abs: 2.8 10*3/uL (ref 1.7–7.7)
Neutrophils Relative %: 67 %
Platelets: 109 10*3/uL — ABNORMAL LOW (ref 150–400)
RBC: 3.63 MIL/uL — ABNORMAL LOW (ref 4.22–5.81)
RDW: 16.7 % — ABNORMAL HIGH (ref 11.5–15.5)
WBC: 4.1 10*3/uL (ref 4.0–10.5)
nRBC: 0 % (ref 0.0–0.2)

## 2019-04-14 LAB — LIPASE, BLOOD: Lipase: 28 U/L (ref 11–51)

## 2019-04-14 MED ORDER — LISINOPRIL 10 MG PO TABS
10.0000 mg | ORAL_TABLET | Freq: Every day | ORAL | Status: DC
  Administered 2019-04-14: 10 mg via ORAL
  Filled 2019-04-14: qty 1

## 2019-04-14 MED ORDER — HYDROMORPHONE HCL 1 MG/ML IJ SOLN
INTRAMUSCULAR | Status: AC
  Administered 2019-04-14: 0.5 mg via INTRAVENOUS
  Filled 2019-04-14: qty 0.5

## 2019-04-14 MED ORDER — ONDANSETRON 4 MG PO TBDP: 4.0000 mg | ORAL_TABLET | Freq: Three times a day (TID) | ORAL | 0 refills | Status: DC | PRN

## 2019-04-14 MED ORDER — OXYCODONE HCL 5 MG PO TABS
ORAL_TABLET | ORAL | Status: AC
  Filled 2019-04-14: qty 2

## 2019-04-14 MED ORDER — SODIUM THIOSULFATE 25 % IV SOLN
25.0000 g | Freq: Once | INTRAVENOUS | Status: DC
  Filled 2019-04-14: qty 100

## 2019-04-14 MED ORDER — AMLODIPINE BESYLATE 10 MG PO TABS
10.0000 mg | ORAL_TABLET | Freq: Every day | ORAL | Status: DC
  Administered 2019-04-14: 10 mg via ORAL
  Filled 2019-04-14: qty 1

## 2019-04-14 MED FILL — ONDANSETRON ODT 4 MG TABLET: 4 | 6 days supply | Qty: 20 | Fill #0

## 2019-04-14 NOTE — Procedures (Signed)
Patient was seen on dialysis and the procedure was supervised.  [id number]  Via AVF BP is  151/89.  Receiving sodium thiosulfate at the end of treatment for calciphylaxis.   Patient appears to be tolerating treatment well.  Lexxie Winberg Tanna Furry 04/14/2019

## 2019-04-14 NOTE — Plan of Care (Signed)
DISCHARGE NOTE HOME Frank [name] to be discharged home per MD order. Discussed prescriptions and follow up appointments with the patient. Prescriptions given to patient; medication list explained in detail. Patient verbalized understanding.  Skin clean, dry and intact without evidence of skin break down, no evidence of skin tears noted. IV catheter discontinued intact. Site without signs and symptoms of complications. Dressing and pressure applied. Pt denies pain at the site currently. No complaints noted.  Patient free of lines, drains, and wounds.   An After Visit Summary (AVS) was printed and given to the patient. Patient wanted to walk to front door on his own discharged home via private auto.  Stephan Minister, RN

## 2019-04-14 NOTE — Progress Notes (Addendum)
[facility] Progress Note   Subjective:  Seen in HD unit. UF goal 3.5L Abd pain N/V improved. Tolerating PO. Plans for discharge today after dialysis.   Objective Vitals:   04/12/19 2358 04/13/19 0537 04/13/19 2142 04/14/19 0413  BP:  (!) 198/86 (!) 152/90 (!) 196/81  Pulse: 73 81 92 80  Resp: _0 Temp:  (!) 97.5 F (36.4 C) 98.4 F (36.9 C) 98.1 F (36.7 C)  TempSrc:  Oral Oral Oral  SpO2: 97% 97%  100%  Weight:      Height:        Physical Exam General: Ill appearing NAD  Heart: RRR No m,r,g  Lungs: CTAB  Abdomen: soft mild diffuse tenderness, improving.  Extremities: No LE edema; dry scaly skin  Dialysis Access: LUE AVF +bruit    Weight change:    Additional Objective Labs: Basic Metabolic Panel: Recent Labs  Lab 04/12/19 0054  NA 137  K 5.7*  CL 97*  CO2 20*  GLUCOSE 66*  BUN 83*  CREATININE 17.32*  CALCIUM 8.1*   CBC: Recent Labs  Lab 04/12/19 0054  WBC 4.5  NEUTROABS 3.2  HGB 10.4*  HCT 33.5*  MCV 89.8  PLT 169   Blood Culture    Component Value Date/Time   SDES PLEURAL 07/29/2018 1300   SDES PLEURAL 07/29/2018 1300   SPECREQUEST NONE 07/29/2018 1300   SPECREQUEST NONE 07/29/2018 1300   CULT  07/29/2018 1300    NO GROWTH 5 DAYS Performed at [facility], [place] [address], [place], [place] [address]    REPTSTATUS 08/03/2018 FINAL 07/29/2018 1300   REPTSTATUS 07/29/2018 FINAL 07/29/2018 1300     Medications: . sodium thiosulfate infusion for calciphylaxis     . amLODipine  10 mg Oral Daily  . apixaban  5 mg Oral BID  . Chlorhexidine Gluconate Cloth  6 each Topical Q0600  . cyclobenzaprine  10 mg Oral TID  . hydrALAZINE  100 mg Oral TID  . lanthanum  1,000 mg Oral TID WC  . lipase/protease/amylase  12,000 Units Oral TID AC  . lisinopril  10 mg Oral Daily  . multivitamin  1 tablet Oral Daily  . pantoprazole  40 mg Oral Daily  . senna-docusate  2 tablet Oral QHS    Dialysis Orders:  [place] TTS 4h  400/800 71.5kg 2K/2.25Ca  No heparin Parsabiv 2.5 TIW Mircera 150 mcg q 2 weeks (last 9/15) Sodium thiosulfate 25 q HD   Assessment/Plan: 1.  Acute on chronic abdominal pain/N/V - Hx chronic pancreatitis. Improving. Lipase 79 >28.  2. ESRD -  HD TTS. HD off schedule today d/t staffing. Back on schedule at outpatient center tomorrow. K+ 4.4  3. Hypertension/volume  - Remains hypertensive. Continue home meds. May need to titrate further.  Amlodipine, hydralazine, lisiniopril. Net UF 4L with HD 9/21. Post HD wt 72.2kg. Continue to challenge UF as tolerated  4. Anemia  - Hgb 10.4. On ESA as outpatient. Follow trends.  5. Metabolic bone disease -  Ca ok. On Parabiv as OP but not on formulary here.  Resume binders when eating.  6. Hx Calciphylaxis (R arm wound - healed). On sodium thiosulfate with HD. Use 2Ca bath.  7. Hx DVT/PE on Eliquis    Ogechi Larina Earthly PA-C [facility] Pager [phone] 04/14/2019,8:41 AM  LOS: 1 day   Nephrology attending: Patient was seen and examined in dialysis unit.  Chart reviewed.  I agree with assessment and plan as  outlined above. Receiving dialysis today off schedule.  Plan for next treatment tomorrow at outpatient.  Continue current medication.  Clinically improved.  Lawson Radar, MD [facility].

## 2019-04-14 NOTE — Discharge Summary (Signed)
Physician Discharge Summary  RUFINO STAUP WNU:[id number] DOB: [birth year] DOA: 04/11/2019  PCP: Patient, [name]  Admit date: 04/11/2019 Discharge date: 04/14/2019  Admitted From: Home Disposition: Home  Recommendations for Outpatient Follow-up:  1. Follow up with PCP in 1-2 weeks 2. Follow with routine hemodialysis 3. Please follow-up with PCP for surveillance of lung mass, you might need repeat CT scan of the chest. 4. Please obtain BMP/CBC in one week 5. Please follow up on the following pending results:  Home Health: None Equipment/Devices: None  Discharge Condition: Stable CODE STATUS: Full code Diet recommendation: Cardiac  Subjective: Seen and examined in dialysis unit while receiving dialysis.  He stated that his abdominal pain is better and nausea is better as well.  The last time he had vomiting was 24 hours ago.  Brief/Interim Summary: Jerell Demery Crowis a 53 y.o.malewith medical history significant ofHTN, diet-controlled DM,depression with anxiety, DVT/PE on Eliquis, ESRD-HD (TTS), CHF with EF 35-40%, chronic pancreatitis, chronic abdominal pain leading to recurrent hospitalizations, who presented with nausea vomiting and abdominal pain for the past 2 days which is located in right side of abdomen, epigastric area, constant, sharp, 10 out of 10 in severity, nonradiating. It was associated with nausea,vomiting, no diarrhea.  Of note, patient has missed about 2 sessions of hemodialysis.  In the ED, pt was found to have lipase 79, elevated blood pressure 220/109. Chest x-ray showed vascular congestion and interstitial pulmonary edema.  Patient was admitted with working diagnosis of pulmonary edema due to missing HD sessions and chronic abdominal pain due to chronic pancreatitis along with nausea and vomiting. Patient admitted for further management and nephrology was consulted and he received his scheduled hemodialysis.  He was treated symptomatically.  Subsequently, his abdominal  pain improved and nausea and vomiting also improved.  I saw patient today for the first time in the dialysis unit and he stated that he always have abdominal pain although it gets better and worse.  The worst it gets is 10 out of 10 and that is what it was when he came in and currently it is 7 out of 10.  He states that " my pain is not completely relieved but it is better enough that I can go home".  He requested me to prescribe him some medications for nausea and pain.  Upon chart review, it seems like patient has chronic pain and he also tends to Dr shop for pain medications and has relationship with the pain clinic as well.  I explained to him that I am unable to prescribe him any pain medications however I have prescribed him Zofran ODT as needed for nausea vomiting.  He verbalized understanding and is agreeable with the discharge plan.  I also explained to him and he was aware that he was diagnosed with lung mass back in January and that he needs surveillance CT scan as an outpatient with the help of PCP.  Currently he has no respiratory symptoms.  Discharge Diagnoses:  Principal Problem:   Recurrent abdominal pain Active Problems:   Anemia of chronic kidney failure   Essential hypertension   Chronic systolic heart failure (HCC)   GERD (gastroesophageal reflux disease)   History of DVT (deep vein thrombosis)   End-stage renal disease on hemodialysis ([place])   PE (pulmonary thromboembolism) ([place])   Hyperkalemia   Chronic pancreatitis [facility])    Discharge Instructions  Discharge Instructions    Discharge patient   Complete by: As directed    Discharge disposition:  01-Home or Self Care   Discharge patient date: 04/14/2019     Allergies as of 04/14/2019      Reactions   Butalbital-apap-caffeine Shortness Of Breath, Swelling, Other (See Comments)   Swelling in throat   Ferrlecit [na Ferric Gluc Cplx In Sucrose] Shortness Of Breath, Swelling, Other (See Comments)   Swelling in throat,  tolerates Venofor   Minoxidil Shortness Of Breath   Tylenol [acetaminophen] Anaphylaxis, Swelling   Darvocet [propoxyphene N-acetaminophen] Hives      Medication List    TAKE these medications   amLODipine 5 MG tablet Commonly known as: NORVASC Take 1 tablet (5 mg total) by mouth daily.   apixaban 5 MG Tabs tablet Commonly known as: Eliquis Take 1 tablet (5 mg total) by mouth 2 (two) times daily.   cyclobenzaprine 10 MG tablet Commonly known as: FLEXERIL Take 10 mg by mouth 3 (three) times daily.   diphenhydrAMINE 25 mg capsule Commonly known as: BENADRYL Take 25 mg by mouth every 8 (eight) hours as needed for itching.   doxycycline 100 MG capsule Commonly known as: VIBRAMYCIN Take 1 capsule (100 mg total) by mouth 2 (two) times daily. One po bid x 7 days   hydrALAZINE 100 MG tablet Commonly known as: APRESOLINE Take 1 tablet (100 mg total) by mouth 3 (three) times daily.   lisinopril 5 MG tablet Commonly known as: ZESTRIL Take 5 mg by mouth daily.   Nephro Vitamins 0.8 MG Tabs Take 1 tablet by mouth daily.   nitroGLYCERIN 0.4 MG SL tablet Commonly known as: NITROSTAT Place 1 tablet (0.4 mg total) under the tongue every 5 (five) minutes as needed for chest pain.   omeprazole 20 MG capsule Commonly known as: PRILOSEC Take 20 mg by mouth daily.   ondansetron 4 MG disintegrating tablet Commonly known as: Zofran ODT Take 1 tablet (4 mg total) by mouth every 8 (eight) hours as needed for nausea or vomiting.   ondansetron 4 MG tablet Commonly known as: ZOFRAN Take 4 mg by mouth 3 (three) times daily as needed for nausea.   Oxycodone HCl 10 MG Tabs Take 10 mg by mouth See admin instructions. Take 10 mg 5 times a day as needed for pain   OXYGEN O2 via nasal cannula at 2L/min for O2 sats 90% or below as needed for shortness of breath   senna-docusate 8.6-50 MG tablet Commonly known as: Senokot-S Take 2 tablets by mouth at bedtime.      Follow-up Information     Lorretta Harp, MD Follow up in 1 week(s).   Specialties: Cardiology, Radiology Contact information: [address] [place] [address] [place] [address] [phone]          Allergies  Allergen Reactions  . Butalbital-Apap-Caffeine Shortness Of Breath, Swelling and Other (See Comments)    Swelling in throat  . Ferrlecit [Na Ferric Gluc Cplx In Sucrose] Shortness Of Breath, Swelling and Other (See Comments)    Swelling in throat, tolerates Venofor  . Minoxidil Shortness Of Breath  . Tylenol [Acetaminophen] Anaphylaxis and Swelling  . Darvocet [Propoxyphene N-Acetaminophen] Hives    Consultations: Nephrology   Procedures/Studies: Dg Chest 2 View  Result Date: 03/23/2019 CLINICAL DATA:  Pain and nasal congestion. EXAM: CHEST - 2 VIEW COMPARISON:  Radiograph March 19, 2019 FINDINGS: Stable right pleural effusion tracking into the fissure with adjacent areas of passive atelectasis. There are increasing areas of patchy airspace opacity in perihilar and suprahilar lungs. No pneumothorax. Cardiomediastinal contours are unchanged from prior. Stable  mediastinal clips. No acute osseous or soft tissue abnormality. IMPRESSION: Right pleural effusion with adjacent passive atelectasis. Findings suggest interstitial edema with more focal airspace opacity likely reflecting developing alveolar edema versus early infection. Electronically Signed   By: Lovena Le M.D.   On: 03/23/2019 03:35   Dg Chest 2 View  Result Date: 03/19/2019 CLINICAL DATA:  53 year old with several week history of cough, chest congestion and shortness of breath, especially when lying down. Patient states that he missed his most recent dialysis appointment. Former smoker. EXAM: CHEST - 2 VIEW COMPARISON:  03/16/2019 and earlier. FINDINGS: AP SEMI-ERECT and LATERAL images were obtained. Cardiac silhouette markedly enlarged, unchanged. Thoracic aorta mildly tortuous and atherosclerotic, unchanged. Hilar and mediastinal  contours otherwise unremarkable. Mild pulmonary venous hypertension without overt pulmonary edema currently. Moderate-sized RIGHT pleural effusion unchanged over several months. Associated passive atelectasis in the RIGHT LOWER LOBE. Lungs remain clear otherwise. No LEFT pleural effusion. Mild degenerative changes involving the thoracic spine. IMPRESSION: 1. Moderate-sized RIGHT pleural effusion unchanged over several months. Associated passive atelectasis in the RIGHT LOWER LOBE. No acute cardiopulmonary disease otherwise. 2. Stable cardiomegaly. Pulmonary venous hypertension without overt pulmonary edema. Electronically Signed   By: Evangeline Dakin M.D.   On: 03/19/2019 08:29   Dg Chest 2 View  Result Date: 03/16/2019 CLINICAL DATA:  Cough and congestion EXAM: CHEST - 2 VIEW COMPARISON:  03/13/2019, 12/06/2018, CT chest 08/28/2018 FINDINGS: Moderate cardiomegaly. Aortic atherosclerosis. Similar appearance of right pleural thickening or effusion with somewhat masslike opacity at the right base. No pneumothorax. IMPRESSION: 1. No significant change in cardiomegaly, right pleural effusion or thickening and masslike opacity at the right lung base. Electronically Signed   By: Donavan Foil M.D.   On: 03/16/2019 00:49   Dg Forearm Right  Result Date: 03/29/2019 CLINICAL DATA:  Wound infection EXAM: RIGHT FOREARM - 2 VIEW COMPARISON:  None. FINDINGS: No fracture or dislocation. No bony destruction or periosteal reaction. Diffuse soft tissue swelling seen around the proximal forearm. Scattered vascular calcifications. IMPRESSION: No acute osseous abnormality. Diffuse soft tissue swelling seen around the proximal forearm. Electronically Signed   By: Prudencio Pair M.D.   On: 03/29/2019 23:42   Dg Chest Port 1 View  Result Date: 04/12/2019 CLINICAL DATA:  Shortness of breath EXAM: PORTABLE CHEST 1 VIEW COMPARISON:  03/23/2019, 03/19/2019, CT chest 08/28/2018 FINDINGS: Moderate cardiomegaly with central vascular  congestion and mild interstitial pulmonary edema. Moderate right pleural effusion with loculated appearance at the right base. Similar airspace disease at the right base. Convex contour. No pneumothorax. IMPRESSION: 1. Cardiomegaly with vascular congestion and mild diffuse interstitial pulmonary edema 2. No significant change in loculated appearing moderate right pleural effusion with persistent airspace disease and possible mass at the right lung base. Electronically Signed   By: Donavan Foil M.D.   On: 04/12/2019 01:07   Dg Foot Complete Left  Result Date: 03/20/2019 CLINICAL DATA:  Left second toe pain EXAM: LEFT FOOT - COMPLETE 3+ VIEW COMPARISON:  None. FINDINGS: No fracture or malalignment.  Vascular calcifications. IMPRESSION: No acute osseous abnormality Electronically Signed   By: Donavan Foil M.D.   On: 03/20/2019 02:00   Vas [place] Upper Extremity Venous Duplex  Result Date: 03/31/2019 UPPER VENOUS STUDY  Indications: Swelling Comparison Study: previous study 02/14/2098 Positive rt cephalic acute and                   chronic Performing Technologist: Toma Copier RVS  Examination Guidelines: A complete evaluation includes B-mode  imaging, spectral Doppler, color Doppler, and power Doppler as needed of all accessible portions of each vessel. Bilateral testing is considered an integral part of a complete examination. Limited examinations for reoccurring indications may be performed as noted.  Right Findings: +----------+------------+---------+-----------+----------+-------+ RIGHT     CompressiblePhasicitySpontaneousPropertiesSummary +----------+------------+---------+-----------+----------+-------+ IJV           Full       Yes       Yes                      +----------+------------+---------+-----------+----------+-------+ Subclavian    Full                                          +----------+------------+---------+-----------+----------+-------+ Axillary      Full        Yes       Yes                      +----------+------------+---------+-----------+----------+-------+ Brachial      Full       Yes       Yes                      +----------+------------+---------+-----------+----------+-------+ Radial        Full       Yes       Yes                      +----------+------------+---------+-----------+----------+-------+ Ulnar         Full       Yes       Yes                      +----------+------------+---------+-----------+----------+-------+ Cephalic    Partial                                         +----------+------------+---------+-----------+----------+-------+ Basilic       None       No        No                       +----------+------------+---------+-----------+----------+-------+  Summary:  Right: No evidence of deep vein thrombosis in the upper extremity. No evidence of thrombosis in the subclavian. Findings consistent with chronic superficial vein thrombosis involving the right cephalic vein and right basilic vein.  Left: No evidence of thrombosis in the subclavian.  *See table(s) above for measurements and observations.  Diagnosing physician: Curt Jews MD Electronically signed by Curt Jews MD on 03/31/2019 at 4:47:03 PM.    Final       Discharge Exam: Vitals:   04/14/19 0413 04/14/19 0712  BP: (!) 196/81 (P) 97/76  Pulse: 80   Resp: 18   Temp: 98.1 F (36.7 C)   SpO2: 100%    Vitals:   04/13/19 0537 04/13/19 2142 04/14/19 0413 04/14/19 0712  BP: (!) 198/86 (!) 152/90 (!) 196/81 (P) 97/76  Pulse: 81 92 80   Resp: _0 Temp: (!) 97.5 F (36.4 C) 98.4 F (36.9 C) 98.1 F (36.7 C)   TempSrc: Oral Oral Oral   SpO2: 97%  100%   Weight:      Height:  General: Pt is alert, awake, not in acute distress Cardiovascular: RRR, S1/S2 +, no rubs, no gallops Respiratory: CTA bilaterally, no wheezing, no rhonchi Abdominal: Soft, mild to moderate generalized abdominal tenderness, ND, bowel sounds  + Extremities: no edema, no cyanosis    The results of significant diagnostics from this hospitalization (including imaging, microbiology, ancillary and laboratory) are listed below for reference.     Microbiology: Recent Results (from the past 240 hour(s))  SARS CORONAVIRUS 2 (TAT 6-24 HRS) Nasopharyngeal Nasopharyngeal Swab     Status: None   Collection Time: 04/12/19  5:59 AM   Specimen: Nasopharyngeal Swab  Result Value Ref Range Status   SARS Coronavirus 2 NEGATIVE NEGATIVE Final    Comment: (NOTE) SARS-CoV-2 target nucleic acids are NOT DETECTED. The SARS-CoV-2 RNA is generally detectable in upper and lower respiratory specimens during the acute phase of infection. Negative results do not preclude SARS-CoV-2 infection, do not rule out co-infections with other pathogens, and should not be used as the sole basis for treatment or other patient management decisions. Negative results must be combined with clinical observations, patient history, and epidemiological information. The expected result is Negative. Fact Sheet for Patients: SugarRoll.be Fact Sheet for Healthcare Providers: https://www.woods-mathews.com/ This test is not yet approved or cleared by the [place] FDA and  has been authorized for detection and/or diagnosis of SARS-CoV-2 by FDA under an Emergency Use Authorization (EUA). This EUA will remain  in effect (meaning this test can be used) for the duration of the COVID-19 declaration under Section 56 4(b)(1) of the Act, 21 U.S.C. section 360bbb-3(b)(1), unless the authorization is terminated or revoked sooner. Performed at [facility], [place] [address], [place], [place] [address]      Labs: BNP (last 3 results) No results for input(s): BNP in the last 8760 hours. Basic Metabolic Panel: Recent Labs  Lab 04/12/19 0054 04/14/19 0741  NA 137 135  K 5.7* 4.4  CL 97* 98  CO2 20* 25  GLUCOSE 66* 137*   BUN 83* 44*  CREATININE 17.32* 13.04*  CALCIUM 8.1* 7.9*   Liver Function Tests: Recent Labs  Lab 04/12/19 0054  AST 13*  ALT 10  ALKPHOS 172*  BILITOT 0.7  PROT 8.1  ALBUMIN 3.5   Recent Labs  Lab 04/12/19 0054 04/14/19 0741  LIPASE 79* 28   No results for input(s): AMMONIA in the last 168 hours. CBC: Recent Labs  Lab 04/12/19 0054 04/14/19 0741  WBC 4.5 4.1  NEUTROABS 3.2 2.8  HGB 10.4* 10.4*  HCT 33.5* 32.0*  MCV 89.8 88.2  PLT 169 109*   Cardiac Enzymes: No results for input(s): CKTOTAL, CKMB, CKMBINDEX, TROPONINI in the last 168 hours. BNP: Invalid input(s): POCBNP CBG: Recent Labs  Lab 04/12/19 0348 04/12/19 0657 04/12/19 1319  GLUCAP 84 95 89   D-Dimer No results for input(s): DDIMER in the last 72 hours. Hgb A1c No results for input(s): HGBA1C in the last 72 hours. Lipid Profile No results for input(s): CHOL, HDL, LDLCALC, TRIG, CHOLHDL, LDLDIRECT in the last 72 hours. Thyroid function studies No results for input(s): TSH, T4TOTAL, T3FREE, THYROIDAB in the last 72 hours.  Invalid input(s): FREET3 Anemia work up No results for input(s): VITAMINB12, FOLATE, FERRITIN, TIBC, IRON, RETICCTPCT in the last 72 hours. Urinalysis    Component Value Date/Time   COLORURINE YELLOW 10/18/2013 [place] 10/18/2013 0419   LABSPEC 1.008 10/18/2013 0419   PHURINE 8.5 (H) 10/18/2013 0419   GLUCOSEU 100 (A) 10/18/2013  [place] (A) 10/18/2013 0419   BILIRUBINUR NEGATIVE 10/18/2013 0419   KETONESUR NEGATIVE 10/18/2013 0419   PROTEINUR 100 (A) 10/18/2013 0419   UROBILINOGEN 0.2 10/18/2013 0419   NITRITE NEGATIVE 10/18/2013 0419   LEUKOCYTESUR NEGATIVE 10/18/2013 0419   Sepsis Labs Invalid input(s): PROCALCITONIN,  WBC,  LACTICIDVEN Microbiology Recent Results (from the past 240 hour(s))  SARS CORONAVIRUS 2 (TAT 6-24 HRS) Nasopharyngeal Nasopharyngeal Swab     Status: None   Collection Time: 04/12/19  5:59 AM   Specimen:  Nasopharyngeal Swab  Result Value Ref Range Status   SARS Coronavirus 2 NEGATIVE NEGATIVE Final    Comment: (NOTE) SARS-CoV-2 target nucleic acids are NOT DETECTED. The SARS-CoV-2 RNA is generally detectable in upper and lower respiratory specimens during the acute phase of infection. Negative results do not preclude SARS-CoV-2 infection, do not rule out co-infections with other pathogens, and should not be used as the sole basis for treatment or other patient management decisions. Negative results must be combined with clinical observations, patient history, and epidemiological information. The expected result is Negative. Fact Sheet for Patients: SugarRoll.be Fact Sheet for Healthcare Providers: https://www.woods-mathews.com/ This test is not yet approved or cleared by the [place] FDA and  has been authorized for detection and/or diagnosis of SARS-CoV-2 by FDA under an Emergency Use Authorization (EUA). This EUA will remain  in effect (meaning this test can be used) for the duration of the COVID-19 declaration under Section 56 4(b)(1) of the Act, 21 U.S.C. section 360bbb-3(b)(1), unless the authorization is terminated or revoked sooner. Performed at [facility], [place] [address] [place], [place] [address]      Time coordinating discharge: Over 30 minutes  SIGNED:   Darliss Cheney, MD  [facility] 04/14/2019, 10:05 AM Pager [phone]  If 7PM-7AM, please contact night-coverage [email or web address] Password [email or web address]

## 2019-04-14 NOTE — Progress Notes (Signed)
Treatment complete goal not met. Patient  refused a standing weight. Na thiosulfate 25 to be given with hemodialysis did not arrive until treatment complete.  Floor RN made aware. MD Carolin Sicks advised medicine can be given at the center.

## 2019-04-16 ENCOUNTER — Encounter (HOSPITAL_COMMUNITY): Payer: Self-pay | Admitting: Emergency Medicine

## 2019-04-16 ENCOUNTER — Emergency Department (HOSPITAL_COMMUNITY): Payer: Medicare Other

## 2019-04-16 ENCOUNTER — Other Ambulatory Visit: Payer: Self-pay

## 2019-04-16 DIAGNOSIS — I428 Other cardiomyopathies: Secondary | ICD-10-CM | POA: Diagnosis not present

## 2019-04-16 DIAGNOSIS — Z79899 Other long term (current) drug therapy: Secondary | ICD-10-CM | POA: Insufficient documentation

## 2019-04-16 DIAGNOSIS — I5042 Chronic combined systolic (congestive) and diastolic (congestive) heart failure: Secondary | ICD-10-CM | POA: Insufficient documentation

## 2019-04-16 DIAGNOSIS — Z87891 Personal history of nicotine dependence: Secondary | ICD-10-CM | POA: Insufficient documentation

## 2019-04-16 DIAGNOSIS — Z20828 Contact with and (suspected) exposure to other viral communicable diseases: Secondary | ICD-10-CM | POA: Diagnosis not present

## 2019-04-16 DIAGNOSIS — R1013 Epigastric pain: Secondary | ICD-10-CM | POA: Diagnosis not present

## 2019-04-16 DIAGNOSIS — N186 End stage renal disease: Secondary | ICD-10-CM | POA: Insufficient documentation

## 2019-04-16 DIAGNOSIS — I120 Hypertensive chronic kidney disease with stage 5 chronic kidney disease or end stage renal disease: Secondary | ICD-10-CM | POA: Diagnosis not present

## 2019-04-16 DIAGNOSIS — E875 Hyperkalemia: Secondary | ICD-10-CM | POA: Diagnosis not present

## 2019-04-16 DIAGNOSIS — D696 Thrombocytopenia, unspecified: Secondary | ICD-10-CM | POA: Insufficient documentation

## 2019-04-16 DIAGNOSIS — I5043 Acute on chronic combined systolic (congestive) and diastolic (congestive) heart failure: Secondary | ICD-10-CM | POA: Diagnosis not present

## 2019-04-16 DIAGNOSIS — Z7901 Long term (current) use of anticoagulants: Secondary | ICD-10-CM | POA: Insufficient documentation

## 2019-04-16 DIAGNOSIS — I132 Hypertensive heart and chronic kidney disease with heart failure and with stage 5 chronic kidney disease, or end stage renal disease: Secondary | ICD-10-CM | POA: Insufficient documentation

## 2019-04-16 DIAGNOSIS — R109 Unspecified abdominal pain: Secondary | ICD-10-CM | POA: Insufficient documentation

## 2019-04-16 DIAGNOSIS — R1110 Vomiting, unspecified: Secondary | ICD-10-CM | POA: Diagnosis not present

## 2019-04-16 DIAGNOSIS — E1122 Type 2 diabetes mellitus with diabetic chronic kidney disease: Secondary | ICD-10-CM | POA: Insufficient documentation

## 2019-04-16 DIAGNOSIS — R112 Nausea with vomiting, unspecified: Secondary | ICD-10-CM | POA: Insufficient documentation

## 2019-04-16 DIAGNOSIS — K859 Acute pancreatitis without necrosis or infection, unspecified: Secondary | ICD-10-CM | POA: Diagnosis not present

## 2019-04-16 DIAGNOSIS — N178 Other acute kidney failure: Secondary | ICD-10-CM | POA: Insufficient documentation

## 2019-04-16 DIAGNOSIS — Z03818 Encounter for observation for suspected exposure to other biological agents ruled out: Secondary | ICD-10-CM | POA: Diagnosis not present

## 2019-04-16 DIAGNOSIS — R079 Chest pain, unspecified: Secondary | ICD-10-CM | POA: Diagnosis not present

## 2019-04-16 MED ORDER — SODIUM CHLORIDE 0.9% FLUSH
3.0000 mL | Freq: Once | INTRAVENOUS | Status: AC
  Administered 2019-04-17: 02:00:00 3 mL via INTRAVENOUS

## 2019-04-16 NOTE — ED Triage Notes (Signed)
Patient is complaining of mid chest pain and sob. Patient was discharged on 04/14/2019. Patient has hx of CHF and dvt's. Patient has dialysis on Tuesday, Thurday, and Saturday.

## 2019-04-17 ENCOUNTER — Emergency Department (HOSPITAL_COMMUNITY)
Admission: EM | Admit: 2019-04-17 | Discharge: 2019-04-17 | Disposition: A | Discharge status: Home / Self Care | Payer: Medicare Other | Source: Home / Self Care | Attending: Emergency Medicine | Admitting: Emergency Medicine

## 2019-04-17 ENCOUNTER — Emergency Department (HOSPITAL_COMMUNITY): Payer: Medicare Other

## 2019-04-17 DIAGNOSIS — N186 End stage renal disease: Secondary | ICD-10-CM

## 2019-04-17 DIAGNOSIS — Z7901 Long term (current) use of anticoagulants: Secondary | ICD-10-CM

## 2019-04-17 DIAGNOSIS — R079 Chest pain, unspecified: Secondary | ICD-10-CM | POA: Diagnosis not present

## 2019-04-17 DIAGNOSIS — D696 Thrombocytopenia, unspecified: Secondary | ICD-10-CM

## 2019-04-17 DIAGNOSIS — N178 Other acute kidney failure: Secondary | ICD-10-CM

## 2019-04-17 DIAGNOSIS — R112 Nausea with vomiting, unspecified: Secondary | ICD-10-CM

## 2019-04-17 DIAGNOSIS — R109 Unspecified abdominal pain: Secondary | ICD-10-CM

## 2019-04-17 DIAGNOSIS — Z992 Dependence on renal dialysis: Secondary | ICD-10-CM

## 2019-04-17 LAB — BASIC METABOLIC PANEL
Anion gap: 16 — ABNORMAL HIGH (ref 5–15)
BUN: 51 mg/dL — ABNORMAL HIGH (ref 6–20)
CO2: 24 mmol/L (ref 22–32)
Calcium: 8.2 mg/dL — ABNORMAL LOW (ref 8.9–10.3)
Chloride: 97 mmol/L — ABNORMAL LOW (ref 98–111)
Creatinine, Ser: 11.49 mg/dL — ABNORMAL HIGH (ref 0.61–1.24)
GFR calc Af Amer: 5 mL/min — ABNORMAL LOW (ref >60)
GFR calc non Af Amer: 4 mL/min — ABNORMAL LOW (ref >60)
Glucose, Bld: 87 mg/dL (ref 70–99)
Potassium: 4.3 mmol/L (ref 3.5–5.1)
Sodium: 137 mmol/L (ref 135–145)

## 2019-04-17 LAB — CBC
HCT: 34.9 % — ABNORMAL LOW (ref 39.0–52.0)
Hemoglobin: 10.8 g/dL — ABNORMAL LOW (ref 13.0–17.0)
MCH: 28.3 pg (ref 26.0–34.0)
MCHC: 30.9 g/dL (ref 30.0–36.0)
MCV: 91.4 fL (ref 80.0–100.0)
Platelets: 107 10*3/uL — ABNORMAL LOW (ref 150–400)
RBC: 3.82 MIL/uL — ABNORMAL LOW (ref 4.22–5.81)
RDW: 17.2 % — ABNORMAL HIGH (ref 11.5–15.5)
WBC: 6.8 10*3/uL (ref 4.0–10.5)
nRBC: 0 % (ref 0.0–0.2)

## 2019-04-17 LAB — HEPATIC FUNCTION PANEL
ALT: 11 U/L (ref 0–44)
AST: 14 U/L — ABNORMAL LOW (ref 15–41)
Albumin: 3.6 g/dL (ref 3.5–5.0)
Alkaline Phosphatase: 163 U/L — ABNORMAL HIGH (ref 38–126)
Bilirubin, Direct: 0.2 mg/dL (ref 0.0–0.2)
Indirect Bilirubin: 0.5 mg/dL (ref 0.3–0.9)
Total Bilirubin: 0.7 mg/dL (ref 0.3–1.2)
Total Protein: 8.2 g/dL — ABNORMAL HIGH (ref 6.5–8.1)

## 2019-04-17 LAB — TROPONIN I (HIGH SENSITIVITY)
Troponin I (High Sensitivity): 30 ng/L — ABNORMAL HIGH (ref <18)
Troponin I (High Sensitivity): 31 ng/L — ABNORMAL HIGH (ref <18)

## 2019-04-17 LAB — LIPASE, BLOOD: Lipase: 45 U/L (ref 11–51)

## 2019-04-17 LAB — GLUCOSE, CAPILLARY: Glucose-Capillary: 81 mg/dL (ref 70–99)

## 2019-04-17 MED ORDER — HYDROMORPHONE HCL 1 MG/ML IJ SOLN
1.0000 mg | Freq: Once | INTRAMUSCULAR | Status: AC
  Administered 2019-04-17: 05:00:00 1 mg via INTRAVENOUS
  Filled 2019-04-17: qty 1

## 2019-04-17 MED ORDER — ONDANSETRON 8 MG PO TBDP: 8.0000 mg | ORAL_TABLET | Freq: Three times a day (TID) | ORAL | 0 refills | Status: DC | PRN

## 2019-04-17 MED ORDER — ONDANSETRON HCL 4 MG/2ML IJ SOLN
4.0000 mg | Freq: Once | INTRAMUSCULAR | Status: AC
  Administered 2019-04-17: 4 mg via INTRAVENOUS
  Filled 2019-04-17: qty 2

## 2019-04-17 MED ORDER — SODIUM CHLORIDE 0.9 % IV BOLUS
500.0000 mL | Freq: Once | INTRAVENOUS | Status: AC
  Administered 2019-04-17: 500 mL via INTRAVENOUS

## 2019-04-17 MED ORDER — MORPHINE SULFATE (PF) 4 MG/ML IV SOLN
4.0000 mg | Freq: Once | INTRAVENOUS | Status: AC
  Administered 2019-04-17: 4 mg via INTRAVENOUS
  Filled 2019-04-17: qty 1

## 2019-04-17 NOTE — Progress Notes (Signed)
Responded to PIV consult. Attempt for PIV in hand unsuccessful. Assessed upper arm with [place]. Pt requests PIV in cephalic vein or shoulder area only. No appropriate vein present. Basillic vein is a potential option; however, pt has reservations about this area stating "I have a blood clot". Notified RN unable to obtain a PIV at this time given the above.

## 2019-04-17 NOTE — Discharge Instructions (Addendum)
Go for your dialysis today!

## 2019-04-17 NOTE — ED Notes (Signed)
Pt removed his own IV. Pt given meal and po fluids at d/c.
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
## 2019-04-18 ENCOUNTER — Other Ambulatory Visit: Payer: Self-pay

## 2019-04-18 DIAGNOSIS — Z87891 Personal history of nicotine dependence: Secondary | ICD-10-CM

## 2019-04-18 DIAGNOSIS — Z86711 Personal history of pulmonary embolism: Secondary | ICD-10-CM

## 2019-04-18 DIAGNOSIS — E1122 Type 2 diabetes mellitus with diabetic chronic kidney disease: Secondary | ICD-10-CM | POA: Diagnosis present

## 2019-04-18 DIAGNOSIS — R112 Nausea with vomiting, unspecified: Secondary | ICD-10-CM | POA: Diagnosis not present

## 2019-04-18 DIAGNOSIS — E875 Hyperkalemia: Secondary | ICD-10-CM | POA: Diagnosis not present

## 2019-04-18 DIAGNOSIS — K219 Gastro-esophageal reflux disease without esophagitis: Secondary | ICD-10-CM | POA: Diagnosis present

## 2019-04-18 DIAGNOSIS — I700 Atherosclerosis of aorta: Secondary | ICD-10-CM | POA: Diagnosis present

## 2019-04-18 DIAGNOSIS — Z03818 Encounter for observation for suspected exposure to other biological agents ruled out: Secondary | ICD-10-CM | POA: Diagnosis not present

## 2019-04-18 DIAGNOSIS — Z20828 Contact with and (suspected) exposure to other viral communicable diseases: Secondary | ICD-10-CM | POA: Diagnosis present

## 2019-04-18 DIAGNOSIS — T8612 Kidney transplant failure: Secondary | ICD-10-CM | POA: Diagnosis present

## 2019-04-18 DIAGNOSIS — Z886 Allergy status to analgesic agent status: Secondary | ICD-10-CM

## 2019-04-18 DIAGNOSIS — I5043 Acute on chronic combined systolic (congestive) and diastolic (congestive) heart failure: Secondary | ICD-10-CM | POA: Diagnosis present

## 2019-04-18 DIAGNOSIS — Z7901 Long term (current) use of anticoagulants: Secondary | ICD-10-CM

## 2019-04-18 DIAGNOSIS — I132 Hypertensive heart and chronic kidney disease with heart failure and with stage 5 chronic kidney disease, or end stage renal disease: Secondary | ICD-10-CM | POA: Diagnosis present

## 2019-04-18 DIAGNOSIS — I272 Pulmonary hypertension, unspecified: Secondary | ICD-10-CM | POA: Diagnosis present

## 2019-04-18 DIAGNOSIS — K861 Other chronic pancreatitis: Secondary | ICD-10-CM | POA: Diagnosis present

## 2019-04-18 DIAGNOSIS — Z86718 Personal history of other venous thrombosis and embolism: Secondary | ICD-10-CM

## 2019-04-18 DIAGNOSIS — K859 Acute pancreatitis without necrosis or infection, unspecified: Principal | ICD-10-CM | POA: Diagnosis present

## 2019-04-18 DIAGNOSIS — N186 End stage renal disease: Secondary | ICD-10-CM | POA: Diagnosis present

## 2019-04-18 DIAGNOSIS — Z888 Allergy status to other drugs, medicaments and biological substances status: Secondary | ICD-10-CM

## 2019-04-18 DIAGNOSIS — I428 Other cardiomyopathies: Secondary | ICD-10-CM | POA: Diagnosis present

## 2019-04-18 DIAGNOSIS — Z992 Dependence on renal dialysis: Secondary | ICD-10-CM

## 2019-04-18 DIAGNOSIS — Z885 Allergy status to narcotic agent status: Secondary | ICD-10-CM

## 2019-04-19 ENCOUNTER — Inpatient Hospital Stay (HOSPITAL_COMMUNITY)
Admission: EM | Admit: 2019-04-19 | Discharge: 2019-04-21 | DRG: 438 | Discharge status: Against Medical Advice | Payer: Medicare Other | Attending: Internal Medicine | Admitting: Internal Medicine

## 2019-04-19 ENCOUNTER — Other Ambulatory Visit: Payer: Self-pay

## 2019-04-19 ENCOUNTER — Encounter (HOSPITAL_COMMUNITY): Payer: Self-pay | Admitting: Emergency Medicine

## 2019-04-19 ENCOUNTER — Emergency Department (HOSPITAL_COMMUNITY): Payer: Medicare Other

## 2019-04-19 DIAGNOSIS — I700 Atherosclerosis of aorta: Secondary | ICD-10-CM | POA: Diagnosis present

## 2019-04-19 DIAGNOSIS — N186 End stage renal disease: Secondary | ICD-10-CM

## 2019-04-19 DIAGNOSIS — E1122 Type 2 diabetes mellitus with diabetic chronic kidney disease: Secondary | ICD-10-CM | POA: Diagnosis present

## 2019-04-19 DIAGNOSIS — I428 Other cardiomyopathies: Secondary | ICD-10-CM

## 2019-04-19 DIAGNOSIS — IMO0002 Reserved for concepts with insufficient information to code with codable children: Secondary | ICD-10-CM | POA: Diagnosis present

## 2019-04-19 DIAGNOSIS — I10 Essential (primary) hypertension: Secondary | ICD-10-CM | POA: Diagnosis present

## 2019-04-19 DIAGNOSIS — I5043 Acute on chronic combined systolic (congestive) and diastolic (congestive) heart failure: Secondary | ICD-10-CM | POA: Diagnosis not present

## 2019-04-19 DIAGNOSIS — D631 Anemia in chronic kidney disease: Secondary | ICD-10-CM | POA: Diagnosis not present

## 2019-04-19 DIAGNOSIS — Z20828 Contact with and (suspected) exposure to other viral communicable diseases: Secondary | ICD-10-CM | POA: Diagnosis present

## 2019-04-19 DIAGNOSIS — I272 Pulmonary hypertension, unspecified: Secondary | ICD-10-CM | POA: Diagnosis present

## 2019-04-19 DIAGNOSIS — Z886 Allergy status to analgesic agent status: Secondary | ICD-10-CM | POA: Diagnosis not present

## 2019-04-19 DIAGNOSIS — E1129 Type 2 diabetes mellitus with other diabetic kidney complication: Secondary | ICD-10-CM | POA: Diagnosis present

## 2019-04-19 DIAGNOSIS — I120 Hypertensive chronic kidney disease with stage 5 chronic kidney disease or end stage renal disease: Secondary | ICD-10-CM | POA: Diagnosis not present

## 2019-04-19 DIAGNOSIS — Z87891 Personal history of nicotine dependence: Secondary | ICD-10-CM | POA: Diagnosis not present

## 2019-04-19 DIAGNOSIS — I132 Hypertensive heart and chronic kidney disease with heart failure and with stage 5 chronic kidney disease, or end stage renal disease: Secondary | ICD-10-CM | POA: Diagnosis present

## 2019-04-19 DIAGNOSIS — Z7901 Long term (current) use of anticoagulants: Secondary | ICD-10-CM | POA: Diagnosis not present

## 2019-04-19 DIAGNOSIS — R112 Nausea with vomiting, unspecified: Secondary | ICD-10-CM | POA: Diagnosis present

## 2019-04-19 DIAGNOSIS — N2889 Other specified disorders of kidney and ureter: Secondary | ICD-10-CM | POA: Diagnosis not present

## 2019-04-19 DIAGNOSIS — K861 Other chronic pancreatitis: Secondary | ICD-10-CM | POA: Diagnosis not present

## 2019-04-19 DIAGNOSIS — Z86711 Personal history of pulmonary embolism: Secondary | ICD-10-CM | POA: Diagnosis not present

## 2019-04-19 DIAGNOSIS — E875 Hyperkalemia: Secondary | ICD-10-CM | POA: Diagnosis present

## 2019-04-19 DIAGNOSIS — E1165 Type 2 diabetes mellitus with hyperglycemia: Secondary | ICD-10-CM | POA: Diagnosis not present

## 2019-04-19 DIAGNOSIS — Z86718 Personal history of other venous thrombosis and embolism: Secondary | ICD-10-CM

## 2019-04-19 DIAGNOSIS — Z9115 Patient's noncompliance with renal dialysis: Secondary | ICD-10-CM

## 2019-04-19 DIAGNOSIS — E785 Hyperlipidemia, unspecified: Secondary | ICD-10-CM | POA: Diagnosis present

## 2019-04-19 DIAGNOSIS — Z888 Allergy status to other drugs, medicaments and biological substances status: Secondary | ICD-10-CM | POA: Diagnosis not present

## 2019-04-19 DIAGNOSIS — E1159 Type 2 diabetes mellitus with other circulatory complications: Secondary | ICD-10-CM | POA: Diagnosis present

## 2019-04-19 DIAGNOSIS — Z992 Dependence on renal dialysis: Secondary | ICD-10-CM | POA: Diagnosis not present

## 2019-04-19 DIAGNOSIS — K7460 Unspecified cirrhosis of liver: Secondary | ICD-10-CM | POA: Diagnosis present

## 2019-04-19 DIAGNOSIS — K859 Acute pancreatitis without necrosis or infection, unspecified: Secondary | ICD-10-CM | POA: Diagnosis present

## 2019-04-19 DIAGNOSIS — Z885 Allergy status to narcotic agent status: Secondary | ICD-10-CM | POA: Diagnosis not present

## 2019-04-19 DIAGNOSIS — T8612 Kidney transplant failure: Secondary | ICD-10-CM | POA: Diagnosis present

## 2019-04-19 DIAGNOSIS — K219 Gastro-esophageal reflux disease without esophagitis: Secondary | ICD-10-CM | POA: Diagnosis present

## 2019-04-19 DIAGNOSIS — R1110 Vomiting, unspecified: Secondary | ICD-10-CM | POA: Diagnosis not present

## 2019-04-19 DIAGNOSIS — R109 Unspecified abdominal pain: Secondary | ICD-10-CM | POA: Diagnosis not present

## 2019-04-19 HISTORY — DX: Nausea with vomiting, unspecified: R11.2

## 2019-04-19 LAB — COMPREHENSIVE METABOLIC PANEL
ALT: 10 U/L (ref 0–44)
AST: 15 U/L (ref 15–41)
Albumin: 3.6 g/dL (ref 3.5–5.0)
Alkaline Phosphatase: 166 U/L — ABNORMAL HIGH (ref 38–126)
Anion gap: 21 — ABNORMAL HIGH (ref 5–15)
BUN: 80 mg/dL — ABNORMAL HIGH (ref 6–20)
CO2: 18 mmol/L — ABNORMAL LOW (ref 22–32)
Calcium: 7.7 mg/dL — ABNORMAL LOW (ref 8.9–10.3)
Chloride: 96 mmol/L — ABNORMAL LOW (ref 98–111)
Creatinine, Ser: 14.91 mg/dL — ABNORMAL HIGH (ref 0.61–1.24)
GFR calc Af Amer: 4 mL/min — ABNORMAL LOW (ref >60)
GFR calc non Af Amer: 3 mL/min — ABNORMAL LOW (ref >60)
Glucose, Bld: 67 mg/dL — ABNORMAL LOW (ref 70–99)
Potassium: 5.4 mmol/L — ABNORMAL HIGH (ref 3.5–5.1)
Sodium: 135 mmol/L (ref 135–145)
Total Bilirubin: 0.7 mg/dL (ref 0.3–1.2)
Total Protein: 8.3 g/dL — ABNORMAL HIGH (ref 6.5–8.1)

## 2019-04-19 LAB — CBC
HCT: 34.9 % — ABNORMAL LOW (ref 39.0–52.0)
Hemoglobin: 10.7 g/dL — ABNORMAL LOW (ref 13.0–17.0)
MCH: 27.2 pg (ref 26.0–34.0)
MCHC: 30.7 g/dL (ref 30.0–36.0)
MCV: 88.8 fL (ref 80.0–100.0)
Platelets: 146 10*3/uL — ABNORMAL LOW (ref 150–400)
RBC: 3.93 MIL/uL — ABNORMAL LOW (ref 4.22–5.81)
RDW: 16.7 % — ABNORMAL HIGH (ref 11.5–15.5)
WBC: 5.2 10*3/uL (ref 4.0–10.5)
nRBC: 0 % (ref 0.0–0.2)

## 2019-04-19 LAB — TROPONIN I (HIGH SENSITIVITY)
Troponin I (High Sensitivity): 27 ng/L — ABNORMAL HIGH (ref <18)
Troponin I (High Sensitivity): 23 ng/L — ABNORMAL HIGH (ref <18)

## 2019-04-19 LAB — SARS CORONAVIRUS 2 BY RT PCR (HOSPITAL ORDER, PERFORMED IN ~~LOC~~ HOSPITAL LAB): SARS Coronavirus 2: NEGATIVE

## 2019-04-19 LAB — LIPASE, BLOOD: Lipase: 139 U/L — ABNORMAL HIGH (ref 11–51)

## 2019-04-19 LAB — CBG MONITORING, ED: Glucose-Capillary: 90 mg/dL (ref 70–99)

## 2019-04-19 MED ORDER — ONDANSETRON HCL 4 MG PO TABS
4.0000 mg | ORAL_TABLET | Freq: Four times a day (QID) | ORAL | Status: DC | PRN
  Filled 2019-04-19: qty 1

## 2019-04-19 MED ORDER — APIXABAN 5 MG PO TABS
5.0000 mg | ORAL_TABLET | Freq: Two times a day (BID) | ORAL | Status: DC
  Administered 2019-04-19 – 2019-04-21 (×4): 5 mg via ORAL
  Filled 2019-04-19 (×6): qty 1

## 2019-04-19 MED ORDER — SENNOSIDES-DOCUSATE SODIUM 8.6-50 MG PO TABS
2.0000 | ORAL_TABLET | Freq: Every day | ORAL | Status: DC
  Filled 2019-04-19: qty 2

## 2019-04-19 MED ORDER — HYDRALAZINE HCL 50 MG PO TABS
100.0000 mg | ORAL_TABLET | Freq: Three times a day (TID) | ORAL | Status: DC
  Administered 2019-04-19 – 2019-04-21 (×6): 100 mg via ORAL
  Filled 2019-04-19 (×10): qty 2

## 2019-04-19 MED ORDER — ONDANSETRON HCL 4 MG/2ML IJ SOLN
4.0000 mg | Freq: Four times a day (QID) | INTRAMUSCULAR | Status: DC | PRN
  Administered 2019-04-19 – 2019-04-20 (×2): 4 mg via INTRAVENOUS
  Filled 2019-04-19 (×2): qty 2

## 2019-04-19 MED ORDER — LISINOPRIL 5 MG PO TABS
5.0000 mg | ORAL_TABLET | Freq: Every day | ORAL | Status: DC
  Administered 2019-04-19 – 2019-04-21 (×3): 5 mg via ORAL
  Filled 2019-04-19 (×3): qty 1

## 2019-04-19 MED ORDER — DIPHENHYDRAMINE HCL 25 MG PO CAPS: 25.0000 mg | ORAL_CAPSULE | Freq: Three times a day (TID) | ORAL | Status: DC | PRN

## 2019-04-19 MED ORDER — HYDROMORPHONE HCL 1 MG/ML IJ SOLN
1.0000 mg | Freq: Once | INTRAMUSCULAR | Status: AC
  Administered 2019-04-19: 1 mg via INTRAVENOUS
  Filled 2019-04-19: qty 1

## 2019-04-19 MED ORDER — CHLORHEXIDINE GLUCONATE CLOTH 2 % EX PADS: 6.0000 | MEDICATED_PAD | Freq: Every day | CUTANEOUS | Status: DC

## 2019-04-19 MED ORDER — PROMETHAZINE HCL 25 MG/ML IJ SOLN
25.0000 mg | Freq: Once | INTRAMUSCULAR | Status: AC
  Administered 2019-04-19: 25 mg via INTRAVENOUS
  Filled 2019-04-19: qty 1

## 2019-04-19 MED ORDER — DEXTROSE 50 % IV SOLN
25.0000 mL | Freq: Once | INTRAVENOUS | Status: AC
  Administered 2019-04-19: 25 mL via INTRAVENOUS
  Filled 2019-04-19: qty 50

## 2019-04-19 MED ORDER — HYDROMORPHONE HCL 1 MG/ML IJ SOLN
0.5000 mg | Freq: Four times a day (QID) | INTRAMUSCULAR | Status: DC | PRN
  Administered 2019-04-19 – 2019-04-20 (×5): 0.5 mg via INTRAVENOUS
  Filled 2019-04-19 (×4): qty 1

## 2019-04-19 MED ORDER — AMLODIPINE BESYLATE 5 MG PO TABS
5.0000 mg | ORAL_TABLET | Freq: Every day | ORAL | Status: DC
  Administered 2019-04-19 – 2019-04-21 (×3): 5 mg via ORAL
  Filled 2019-04-19 (×3): qty 1

## 2019-04-19 MED ORDER — OXYCODONE HCL 5 MG PO TABS
10.0000 mg | ORAL_TABLET | Freq: Every day | ORAL | Status: DC | PRN
  Administered 2019-04-19 – 2019-04-20 (×3): 10 mg via ORAL
  Filled 2019-04-19 (×2): qty 2

## 2019-04-19 MED ORDER — SODIUM CHLORIDE 0.9 % IV BOLUS
500.0000 mL | Freq: Once | INTRAVENOUS | Status: AC
  Administered 2019-04-19: 500 mL via INTRAVENOUS

## 2019-04-19 MED ORDER — PANTOPRAZOLE SODIUM 40 MG PO TBEC
40.0000 mg | DELAYED_RELEASE_TABLET | Freq: Every day | ORAL | Status: DC
  Administered 2019-04-19 – 2019-04-21 (×3): 40 mg via ORAL
  Filled 2019-04-19 (×3): qty 1

## 2019-04-19 MED ORDER — SODIUM CHLORIDE 0.9% FLUSH
3.0000 mL | Freq: Once | INTRAVENOUS | Status: AC
  Administered 2019-04-19: 3 mL via INTRAVENOUS

## 2019-04-19 MED ORDER — SODIUM ZIRCONIUM CYCLOSILICATE 10 G PO PACK
10.0000 g | PACK | Freq: Once | ORAL | Status: AC
  Administered 2019-04-19: 10 g via ORAL
  Filled 2019-04-19: qty 1

## 2019-04-19 MED ORDER — CYCLOBENZAPRINE HCL 10 MG PO TABS
10.0000 mg | ORAL_TABLET | Freq: Three times a day (TID) | ORAL | Status: DC
  Administered 2019-04-19 – 2019-04-21 (×5): 10 mg via ORAL
  Filled 2019-04-19 (×7): qty 1
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
## 2019-04-19 NOTE — ED Notes (Signed)
IV team unable to get blood from pt.
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
## 2019-04-19 NOTE — Progress Notes (Signed)
Pt currently refusing all medications. Pt states 'he just wants to be left alone and wouldl ike something to eat.' Educated on being NPO. On call provider made aware. RN will continue to monitor.     04/19/19 2114  Vitals  BP (!) 196/86  BP Location Right Leg

## 2019-04-19 NOTE — ED Notes (Signed)
[facility] called for transport.

## 2019-04-19 NOTE — ED Notes (Signed)
Main lab phlebotomy called for lab draw.
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
## 2019-04-19 NOTE — ED Triage Notes (Signed)
Patient complaining of nausea, vomiting, and pain. Patient has a hx of pancreatitis. Patient states that this started Friday.

## 2019-04-20 ENCOUNTER — Inpatient Hospital Stay (HOSPITAL_COMMUNITY): Payer: Medicare Other

## 2019-04-20 DIAGNOSIS — K861 Other chronic pancreatitis: Secondary | ICD-10-CM

## 2019-04-20 DIAGNOSIS — E1129 Type 2 diabetes mellitus with other diabetic kidney complication: Secondary | ICD-10-CM

## 2019-04-20 DIAGNOSIS — I5043 Acute on chronic combined systolic (congestive) and diastolic (congestive) heart failure: Secondary | ICD-10-CM

## 2019-04-20 DIAGNOSIS — I10 Essential (primary) hypertension: Secondary | ICD-10-CM

## 2019-04-20 DIAGNOSIS — Z992 Dependence on renal dialysis: Secondary | ICD-10-CM

## 2019-04-20 DIAGNOSIS — E1165 Type 2 diabetes mellitus with hyperglycemia: Secondary | ICD-10-CM

## 2019-04-20 DIAGNOSIS — Z86718 Personal history of other venous thrombosis and embolism: Secondary | ICD-10-CM

## 2019-04-20 DIAGNOSIS — N186 End stage renal disease: Secondary | ICD-10-CM

## 2019-04-20 LAB — GLUCOSE, CAPILLARY
Glucose-Capillary: 125 mg/dL — ABNORMAL HIGH (ref 70–99)
Glucose-Capillary: 50 mg/dL — ABNORMAL LOW (ref 70–99)
Glucose-Capillary: 65 mg/dL — ABNORMAL LOW (ref 70–99)
Glucose-Capillary: 67 mg/dL — ABNORMAL LOW (ref 70–99)
Glucose-Capillary: 88 mg/dL (ref 70–99)
Glucose-Capillary: 89 mg/dL (ref 70–99)

## 2019-04-20 IMAGING — DX DG CHEST 1V PORT
1 series · 1 of 1 positions shown · non-contrast
Comparison: Chest radiographs 11/11/2016

CLINICAL DATA: Abdominal pain, chest pain.

EXAM:
PORTABLE CHEST 1 VIEW

[chest ap]
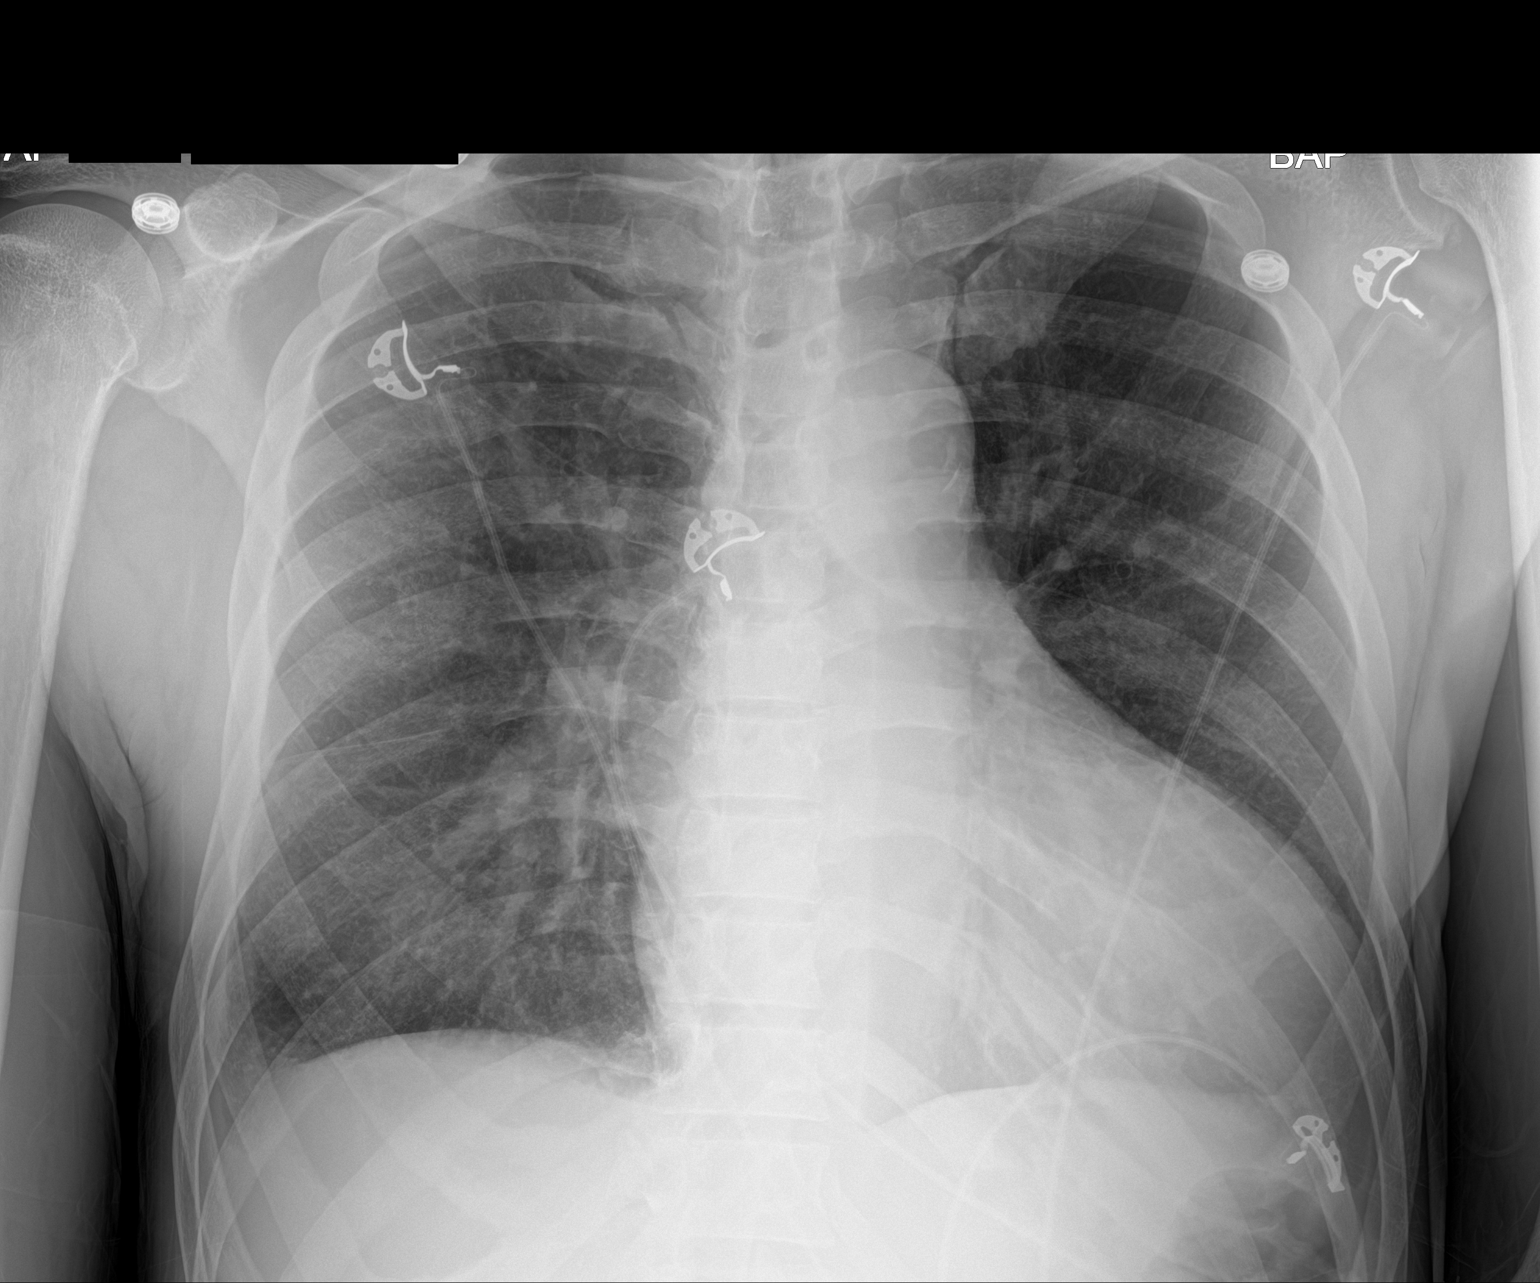

[1 of 1 positions shown; findings below may reference images not displayed]

FINDINGS: Stable cardiomegaly and aortic atherosclerosis. Trace bilateral
pleural effusions, similar to prior. There is vascular congestion.
Decreased pulmonary edema from prior. No confluent airspace disease.
No pneumothorax. No acute osseous abnormality.
IMPRESSION: Stable cardiomegaly and and vascular congestion. Decreased pulmonary
edema.

## 2019-04-20 IMAGING — DX DG ABDOMEN 1V
1 series · 1 of 1 positions shown · non-contrast
Comparison: Radiograph 10/13/2016, CT 09/27/2016

CLINICAL DATA: Abdominal pain.

EXAM:
ABDOMEN - 1 VIEW

[abdomen kub]
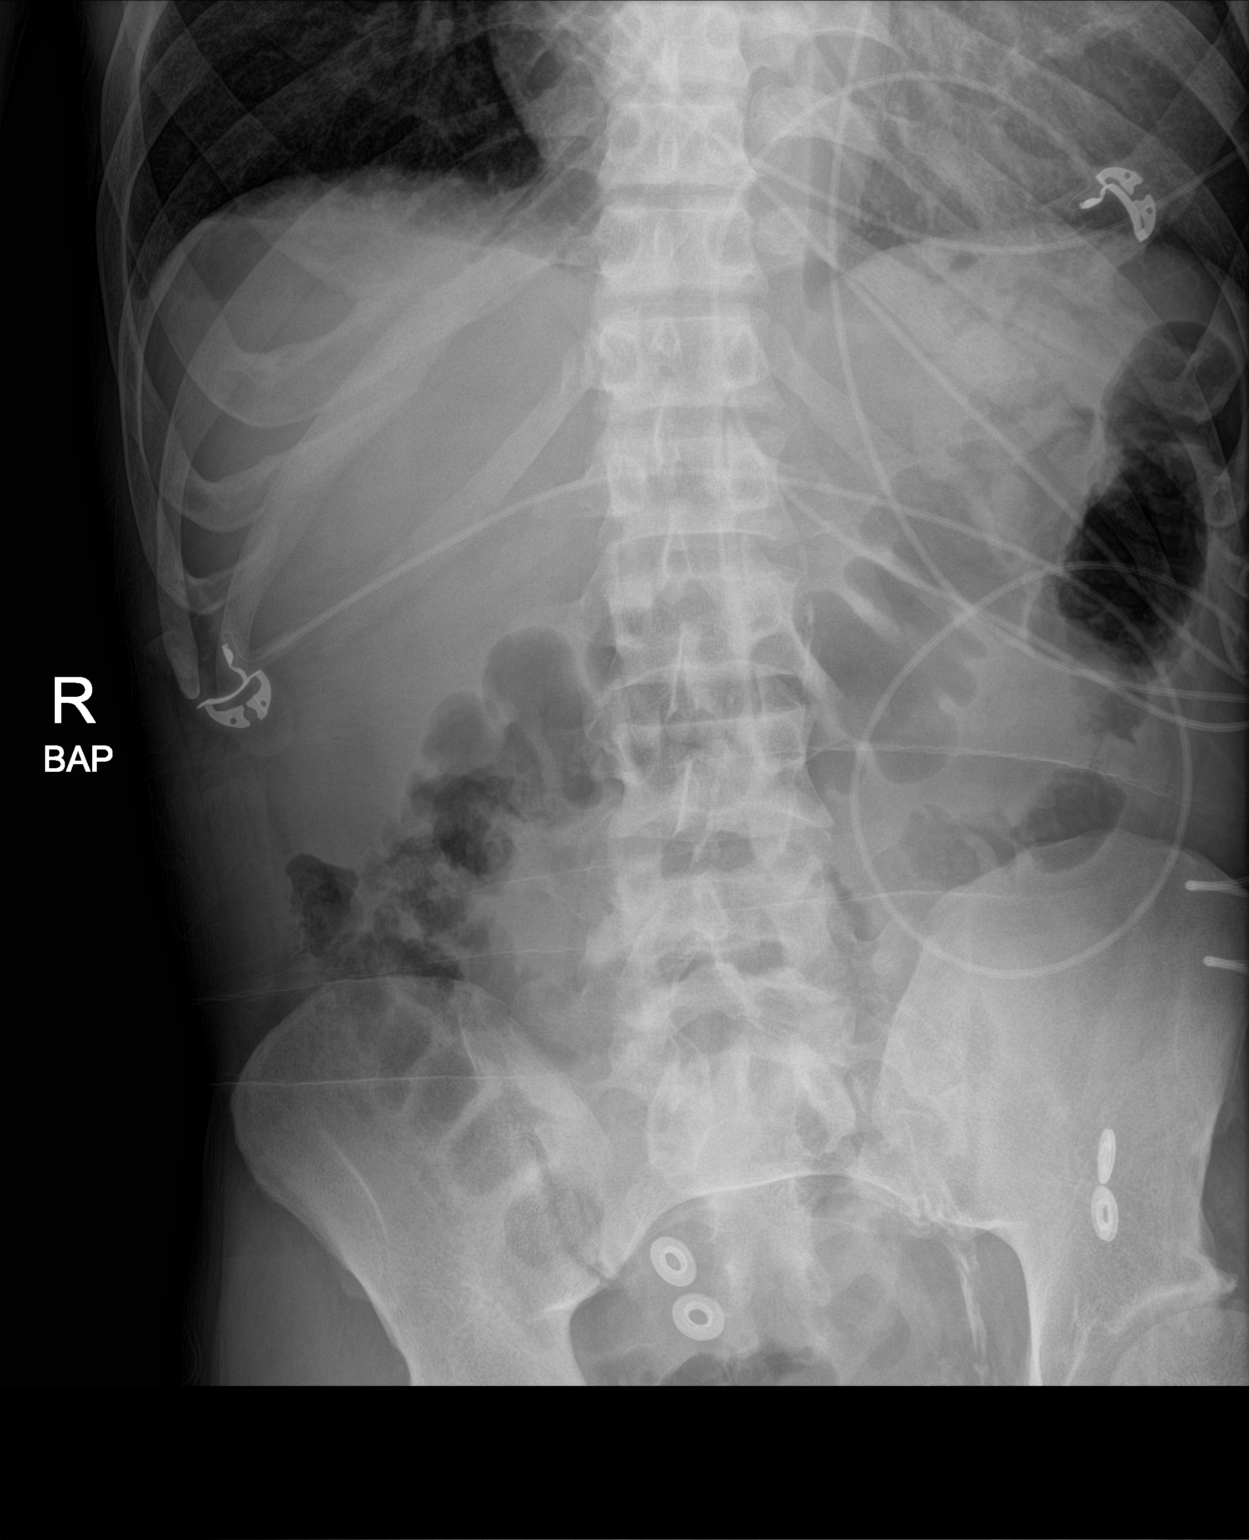

[1 of 1 positions shown; findings below may reference images not displayed]

FINDINGS: Normal bowel gas pattern. No dilated bowel loops to suggest
obstruction. No evidence of free air. Small stool burden. Vascular
calcifications, no radiopaque calculi. No acute osseous
abnormalities are seen.
IMPRESSION: Normal bowel gas pattern.

## 2019-04-20 MED ORDER — HYDROMORPHONE HCL 1 MG/ML IJ SOLN
INTRAMUSCULAR | Status: AC
  Filled 2019-04-20: qty 0.5

## 2019-04-20 MED ORDER — DEXTROSE 50 % IV SOLN
25.0000 mL | INTRAVENOUS | Status: DC | PRN
  Administered 2019-04-20 (×2): 25 mL via INTRAVENOUS
  Filled 2019-04-20: qty 50

## 2019-04-20 MED ORDER — HYDROMORPHONE HCL 1 MG/ML IJ SOLN
0.5000 mg | INTRAMUSCULAR | Status: DC | PRN
  Administered 2019-04-20 – 2019-04-21 (×3): 0.5 mg via INTRAVENOUS
  Filled 2019-04-20 (×3): qty 1

## 2019-04-20 MED ORDER — SODIUM THIOSULFATE 25 % IV SOLN
25.0000 g | INTRAVENOUS | Status: DC
  Administered 2019-04-20: 25 g via INTRAVENOUS
  Filled 2019-04-20 (×2): qty 100

## 2019-04-20 MED ORDER — SENNOSIDES-DOCUSATE SODIUM 8.6-50 MG PO TABS
2.0000 | ORAL_TABLET | Freq: Two times a day (BID) | ORAL | Status: DC
  Administered 2019-04-20 – 2019-04-21 (×2): 2 via ORAL
  Filled 2019-04-20 (×3): qty 2

## 2019-04-20 MED ORDER — DEXTROSE 50 % IV SOLN
INTRAVENOUS | Status: AC
  Filled 2019-04-20: qty 50

## 2019-04-20 MED ORDER — POLYETHYLENE GLYCOL 3350 17 G PO PACK: 17.0000 g | PACK | Freq: Every day | ORAL | Status: DC | PRN

## 2019-04-20 MED ORDER — OXYCODONE HCL 5 MG PO TABS
ORAL_TABLET | ORAL | Status: AC
  Filled 2019-04-20: qty 2

## 2019-04-20 MED ORDER — OXYCODONE HCL 5 MG PO TABS
15.0000 mg | ORAL_TABLET | Freq: Every day | ORAL | Status: DC | PRN
  Administered 2019-04-21: 15 mg via ORAL
  Filled 2019-04-20: qty 3

## 2019-04-20 NOTE — Progress Notes (Signed)
This IV/PICC  RN went to place PIV in patient per IV consult request.  IV/PICC RN introduced self to patient, patient responded "where your ultrasound, I aint never seen you before" IV/PICC  RN gets Ultrasound set up and patient asks " and where is your baby needle"  IV/PICC  RN educated patient that IV team doesn't use "baby needles" and will need to further assess for piv placement" Patient continues to question IV RN regarding education and length of time on IV team.  IV/PICC RN has another IV/PICC RN to assess and place PIV.  Patient begins questioning that IV/PICC RN with same questions.  IV/PICC RN attempts to assess patient right arm, patient requesting IV to be placed in hand or wrist area.  Patient is educated that since that hand has infiltrated from a previous IV, it is unsafe to place a PIV in that area.  Patient starts yelling "yall just dont wanna try" Patient RN notified of findings.

## 2019-04-20 NOTE — Progress Notes (Signed)
Patient ID: Frank [name], male   DOB: Jun 22, 1966, 53 y.o.   MRN: [id number]  PROGRESS NOTE    Frank [name]  LXB:[id number] DOB: 1965/12/14 DOA: 04/19/2019 PCP: Patient, [name]   Brief Narrative:  53 year old male with history of hypertension, diabetes mellitus type 2, hyperlipidemia, end-stage renal disease on hemodialysis, cirrhosis, chronic systolic and diastolic heart failure, DVT/PE on Eliquis, chronic pancreatitis with pseudocyst, recent hospitalization from 04/12/2019-04/14/2019 for nausea, vomiting and abdominal pain in the setting of chronic pancreatitis presented again on 04/19/2019 with increasing abdominal pain with nausea and vomiting.  Lipase was slightly elevated  Assessment & Plan:  Recurrent abdominal pain with nausea and vomiting in a patient with chronic pancreatitis -Lipase was only minimally elevated on presentation. -Patient states that he is still in severe pain and states that the current pain medication is not helping.  Increase Dilaudid to 0.5 mg every 4 hours as needed.  Patient wants to try solid food.  Will order for soft diet.  Continue PRN oral oxycodone.  Patient apparently takes 15 mg of oxycodone 4-5 times a day at home. -This is his second hospitalization for similar symptoms  in less than 2 weeks. -X-ray of the abdomen had shown mildly dilated small bowel loops on presentation.  We will get a CT of the abdomen and pelvis.  If symptoms do not improve, might need GI evaluation. -Continue antiemetics.  End-stage renal disease on hemodialysis  Mild hyperkalemia on presentation: No labs today.  Repeat a.m. labs. -Nephrology following.  Dialysis as per nephrology recommendations  Anemia of chronic renal failure -Hemoglobin stable.  Monitor.  Essential hypertension -Blood pressure on the higher side.  Monitor.  Continue amlodipine, hydralazine and lisinopril.  History of PE/DVT -Continue Eliquis  Acute on chronic systolic and diastolic heart failure -Volume  managed by dialysis.  Chest x-ray on presentation had shown congestion.  Diabetes mellitus type 2 -Diet controlled.  DVT prophylaxis: Eliquis Code Status: Full Family Communication: None at bedside Disposition Plan: Home in 1 to 2 days once clinically improved  Consultants: Neurology  Procedures: None  Antimicrobials: None   Subjective: Patient seen and examined at bedside.  He just returned back from dialysis.  Still complains of intermittent severe pain and states that current IV pain medication dose is not helping.  Wants to try some solid food as well.  Still has intermittent nausea.  No worsening shortness of breath.  Objective: Vitals:   04/20/19 0700 04/20/19 0730 04/20/19 0811 04/20/19 0932  BP: (!) 163/84 (!) 162/80 (!) 178/81 (!) 183/85  Pulse: 85 84 82 82  Resp:   19 18  Temp:   98 F (36.7 C) 98.1 F (36.7 C)  TempSrc:   Oral Oral  SpO2:    96%  Weight:   75.3 kg   Height:        Intake/Output Summary (Last 24 hours) at 04/20/2019 1023 Last data filed at 04/20/2019 0757 Gross per 24 hour  Intake 0 ml  Output 4074 ml  Net -4074 ml   Filed Weights   04/19/19 0204 04/20/19 0420 04/20/19 0811  Weight: 77.1 kg 78 kg 75.3 kg    Examination:  General exam: Appears calm and comfortable.  Looks older than stated age. Respiratory system: Bilateral decreased breath sounds at bases with basilar crackles Cardiovascular system: S1 & S2 heard, Rate controlled Gastrointestinal system: Abdomen is nondistended, soft and tender in the umbilical and epigastric regions.  Normal bowel sounds heard. Extremities: No cyanosis, clubbing; trace  edema Central nervous system: Alert and oriented. No focal neurological deficits. Moving extremities Skin: No rashes, lesions or ulcers Psychiatry: Looks anxious.  Intermittently gets upset and angry.    Data Reviewed: I have personally reviewed following labs and imaging studies  CBC: Recent Labs  Lab 04/14/19 0741 04/16/19 2356  04/19/19 0233  WBC 4.1 6.8 5.2  NEUTROABS 2.8  --   --   HGB 10.4* 10.8* 10.7*  HCT 32.0* 34.9* 34.9*  MCV 88.2 91.4 88.8  PLT 109* 107* 924*   Basic Metabolic Panel: Recent Labs  Lab 04/14/19 0741 04/16/19 2356 04/19/19 0233  NA 135 137 135  K 4.4 4.3 5.4*  CL 98 97* 96*  CO2 25 24 18*  GLUCOSE 137* 87 67*  BUN 44* 51* 80*  CREATININE 13.04* 11.49* 14.91*  CALCIUM 7.9* 8.2* 7.7*   GFR: Estimated Creatinine Clearance: 6 mL/min (A) (by C-G formula based on SCr of 14.91 mg/dL (H)). Liver Function Tests: Recent Labs  Lab 04/16/19 2356 04/19/19 0233  AST 14* 15  ALT 11 10  ALKPHOS 163* 166*  BILITOT 0.7 0.7  PROT 8.2* 8.3*  ALBUMIN 3.6 3.6   Recent Labs  Lab 04/14/19 0741 04/16/19 2356 04/19/19 0233  LIPASE 28 45 139*   No results for input(s): AMMONIA in the last 168 hours. Coagulation Profile: No results for input(s): INR, PROTIME in the last 168 hours. Cardiac Enzymes: No results for input(s): CKTOTAL, CKMB, CKMBINDEX, TROPONINI in the last 168 hours. BNP (last 3 results) No results for input(s): PROBNP in the last 8760 hours. HbA1C: No results for input(s): HGBA1C in the last 72 hours. CBG: Recent Labs  Lab 04/20/19 0058 04/20/19 0438 04/20/19 0516 04/20/19 0709 04/20/19 0756  GLUCAP 125* 67* 89 65* 88   Lipid Profile: No results for input(s): CHOL, HDL, LDLCALC, TRIG, CHOLHDL, LDLDIRECT in the last 72 hours. Thyroid Function Tests: No results for input(s): TSH, T4TOTAL, FREET4, T3FREE, THYROIDAB in the last 72 hours. Anemia Panel: No results for input(s): VITAMINB12, FOLATE, FERRITIN, TIBC, IRON, RETICCTPCT in the last 72 hours. Sepsis Labs: No results for input(s): PROCALCITON, LATICACIDVEN in the last 168 hours.  Recent Results (from the past 240 hour(s))  SARS CORONAVIRUS 2 (TAT 6-24 HRS) Nasopharyngeal Nasopharyngeal Swab     Status: None   Collection Time: 04/12/19  5:59 AM   Specimen: Nasopharyngeal Swab  Result Value Ref Range  Status   SARS Coronavirus 2 NEGATIVE NEGATIVE Final    Comment: (NOTE) SARS-CoV-2 target nucleic acids are NOT DETECTED. The SARS-CoV-2 RNA is generally detectable in upper and lower respiratory specimens during the acute phase of infection. Negative results do not preclude SARS-CoV-2 infection, do not rule out co-infections with other pathogens, and should not be used as the sole basis for treatment or other patient management decisions. Negative results must be combined with clinical observations, patient history, and epidemiological information. The expected result is Negative. Fact Sheet for Patients: SugarRoll.be Fact Sheet for Healthcare Providers: https://www.woods-mathews.com/ This test is not yet approved or cleared by the [place] FDA and  has been authorized for detection and/or diagnosis of SARS-CoV-2 by FDA under an Emergency Use Authorization (EUA). This EUA will remain  in effect (meaning this test can be used) for the duration of the COVID-19 declaration under Section 56 4(b)(1) of the Act, 21 U.S.C. section 360bbb-3(b)(1), unless the authorization is terminated or revoked sooner. Performed at [facility], [place] [address] [place], [place] [address]   SARS Coronavirus 2 [facility]  order, Performed in [facility] hospital lab) Nasopharyngeal Nasopharyngeal Swab     Status: None   Collection Time: 04/19/19  7:07 AM   Specimen: Nasopharyngeal Swab  Result Value Ref Range Status   SARS Coronavirus 2 NEGATIVE NEGATIVE Final    Comment: (NOTE) If result is NEGATIVE SARS-CoV-2 target nucleic acids are NOT DETECTED. The SARS-CoV-2 RNA is generally detectable in upper and lower  respiratory specimens during the acute phase of infection. The lowest  concentration of SARS-CoV-2 viral copies this assay can detect is 250  copies / mL. A negative result does not preclude SARS-CoV-2 infection  and should not be used as the sole  basis for treatment or other  patient management decisions.  A negative result may occur with  improper specimen collection / handling, submission of specimen other  than nasopharyngeal swab, presence of viral mutation(s) within the  areas targeted by this assay, and inadequate number of viral copies  (<250 copies / mL). A negative result must be combined with clinical  observations, patient history, and epidemiological information. If result is POSITIVE SARS-CoV-2 target nucleic acids are DETECTED. The SARS-CoV-2 RNA is generally detectable in upper and lower  respiratory specimens dur ing the acute phase of infection.  Positive  results are indicative of active infection with SARS-CoV-2.  Clinical  correlation with patient history and other diagnostic information is  necessary to determine patient infection status.  Positive results do  not rule out bacterial infection or co-infection with other viruses. If result is PRESUMPTIVE POSTIVE SARS-CoV-2 nucleic acids MAY BE PRESENT.   A presumptive positive result was obtained on the submitted specimen  and confirmed on repeat testing.  While 2019 novel coronavirus  (SARS-CoV-2) nucleic acids may be present in the submitted sample  additional confirmatory testing may be necessary for epidemiological  and / or clinical management purposes  to differentiate between  SARS-CoV-2 and other Sarbecovirus currently known to infect humans.  If clinically indicated additional testing with an alternate test  methodology [phone]) is advised. The SARS-CoV-2 RNA is generally  detectable in upper and lower respiratory sp ecimens during the acute  phase of infection. The expected result is Negative. Fact Sheet for Patients:  StrictlyIdeas.no Fact Sheet for Healthcare Providers: BankingDealers.co.za This test is not yet approved or cleared by the [place] FDA and has been authorized for detection  and/or diagnosis of SARS-CoV-2 by FDA under an Emergency Use Authorization (EUA).  This EUA will remain in effect (meaning this test can be used) for the duration of the COVID-19 declaration under Section 564(b)(1) of the Act, 21 U.S.C. section 360bbb-3(b)(1), unless the authorization is terminated or revoked sooner. Performed at [facility], [place] [address], [place], [place] [address]          Radiology Studies: Dg Abdomen Acute W/chest  Result Date: 04/19/2019 CLINICAL DATA:  Abdominal pain and vomiting. Symptoms for 3 days. EXAM: DG ABDOMEN ACUTE W/ 1V CHEST COMPARISON:  Radiograph 11/15/2018. CT 06/22/2018 FINDINGS: Chronic cardiomegaly. Unchanged mediastinal contours. Chronic loculated pleuroparenchymal opacity at the right lung base and laterally, decreased pleural effusion component from prior exam. Pulmonary edema with mild progression. Small left pleural effusion. No free intra-abdominal air. Few prominent air-filled small bowel loops in the left abdomen without obstruction. Vascular calcifications and partially calcified renal transplant in the left lower quadrant. No acute osseous abnormalities are seen. IMPRESSION: 1. Few prominent air-filled small bowel loops in the left abdomen without evidence obstruction. No free air. 2. Chronic loculated pleuroparenchymal  opacity at the right lung base, decreased pleural fluid from prior exam. 3. Chronic cardiomegaly.  Pulmonary edema has mild progressed. Electronically Signed   By: Keith Rake M.D.   On: 04/19/2019 03:07        Scheduled Meds: . amLODipine  5 mg Oral Daily  . apixaban  5 mg Oral BID  . Chlorhexidine Gluconate Cloth  6 each Topical Q0600  . cyclobenzaprine  10 mg Oral TID  . hydrALAZINE  100 mg Oral TID  . lisinopril  5 mg Oral Daily  . oxyCODONE      . pantoprazole  40 mg Oral Daily  . senna-docusate  2 tablet Oral QHS   Continuous Infusions: . sodium thiosulfate infusion for  calciphylaxis            Aline August, MD [facility] 04/20/2019, 10:23 AM

## 2019-04-20 NOTE — Progress Notes (Signed)
Present to assess for PIV.  Patient refusing to have PIV placed anywhere other then right hand.  Right hand very swollen from previous PIV with no visible veins.  Unsuitable to place PIV in hand at this time.  Pt. Refusing any assessment above right wrist and became angry that I would not place PIV in hand.  Explained reasons for this but patient still refusing PIV in any other locations

## 2019-04-20 NOTE — Progress Notes (Signed)
[facility] Progress Note   Subjective:  Seen in HD unit this am, completed HD with 4.5L UF. Appears comfortable at end of treatment, wants to eat. Denies CP, SOB.   Objective Vitals:   04/20/19 0700 04/20/19 0730 04/20/19 0811 04/20/19 0932  BP: (!) 163/84 (!) 162/80 (!) 178/81 (!) 183/85  Pulse: 85 84 82 82  Resp:   19 18  Temp:   98 F (36.7 C) 98.1 F (36.7 C)  TempSrc:   Oral Oral  SpO2:   96% 96%  Weight:   75.3 kg   Height:        Physical Exam General: WNWD NAD, alert  Heart: RRR  Lungs: CTAB  Abdomen: soft mild diffuse tenderness to palpation  Extremities: No LE edema; dry scaly skin  Dialysis Access: LUE AVF in use on HD   Weight change: 0.888 kg   Additional Objective Labs: Basic Metabolic Panel: Recent Labs  Lab 04/14/19 0741 04/16/19 2356 04/19/19 0233  NA 135 137 135  K 4.4 4.3 5.4*  CL 98 97* 96*  CO2 25 24 18*  GLUCOSE 137* 87 67*  BUN 44* 51* 80*  CREATININE 13.04* 11.49* 14.91*  CALCIUM 7.9* 8.2* 7.7*   CBC: Recent Labs  Lab 04/14/19 0741 04/16/19 2356 04/19/19 0233  WBC 4.1 6.8 5.2  NEUTROABS 2.8  --   --   HGB 10.4* 10.8* 10.7*  HCT 32.0* 34.9* 34.9*  MCV 88.2 91.4 88.8  PLT 109* 107* 146*   Blood Culture    Component Value Date/Time   SDES PLEURAL 07/29/2018 1300   SDES PLEURAL 07/29/2018 1300   SPECREQUEST NONE 07/29/2018 1300   SPECREQUEST NONE 07/29/2018 1300   CULT  07/29/2018 1300    NO GROWTH 5 DAYS Performed at [facility] [address], [place], [place] [address]    REPTSTATUS 08/03/2018 FINAL 07/29/2018 1300   REPTSTATUS 07/29/2018 FINAL 07/29/2018 1300     Medications: . sodium thiosulfate infusion for calciphylaxis 25 g (04/20/19 1034)   . amLODipine  5 mg Oral Daily  . apixaban  5 mg Oral BID  . Chlorhexidine Gluconate Cloth  6 each Topical Q0600  . cyclobenzaprine  10 mg Oral TID  . hydrALAZINE  100 mg Oral TID  . lisinopril  5 mg Oral Daily  . oxyCODONE      .  pantoprazole  40 mg Oral Daily  . senna-docusate  2 tablet Oral BID     Home meds:  - amlodipine 5/ hydralazine 100 tid/ lisinopril 5   - sl ntg prn  - apixaban 5 bid  - omeprazole 40 qd  - oxycodone 67m prn/ home O2  2L [place] prn   Outpt HD: [facility]  4h  400/800  71.5kg   2/2.25 bath Hep none  L AVF  mircera every 2wks 150ug  parsabiv 2.5   sod thio 25 gm tiw IV  Assessment: 1. Abd pain / nausea - recurrent issue. H/o pancreatitis. Per primary. Abd pain on dialysis sounds like dialyzing off of chronic pain medications.  2. ESRD - on HD TTS.  Missed last session.  +pulm edema by CXR. HD this am with 4L removed.  Can resume regular HD schedule.  3. HTN - cont meds 4. Anemia ckd - check records, Hb ok here 10.7 5. Hx of PE/ DVT - on eliquis   Ogechi GLarina EarthlyPA-C [facility] Pager [phone]/29/2020,11:41 AM  LOS: 1 day

## 2019-04-21 NOTE — Progress Notes (Signed)
Pt left AMA; MD aware.

## 2019-04-21 NOTE — Discharge Summary (Addendum)
Discharge summary: As per prior documentation "53 year old male with history of hypertension, diabetes mellitus type 2, hyperlipidemia, end-stage renal disease on hemodialysis, cirrhosis, chronic systolic and diastolic heart failure, DVT/PE on Eliquis, chronic pancreatitis with pseudocyst, recent hospitalization from 04/12/2019-04/14/2019 for nausea, vomiting and abdominal pain in the setting of chronic pancreatitis presented again on 04/19/2019 with increasing abdominal pain with nausea and vomiting.  Lipase was slightly elevated".  I never met this patient.  Patient signed himself out of the hospital [place] before I could ever meet patient.

## 2019-04-21 NOTE — Consult Note (Signed)
   [facility] CM Inpatient Consult   04/21/2019  Frank [name] 03-15-1966 [id number]   Patient screened for extreme high risk score for unplanned readmission score of 83%  for hospitalizations and ED visit to check if potential [place] [employer] services are needed.   Primary Care Provider has no primary care provider noted  Per chart patient left AMA.  Call attempted to phone number provider, wireless service message states the phone is not in service.  No current way to reach patient.   For questions contact:   Natividad Brood, RN BSN [facility] Liaison  [phone] business mobile phone Toll free office [phone]  Fax number: [phone] [name]

## 2019-04-22 ENCOUNTER — Encounter: Payer: Medicare Other | Admitting: Vascular Surgery

## 2019-04-22 DIAGNOSIS — T8610 Unspecified complication of kidney transplant: Secondary | ICD-10-CM | POA: Diagnosis not present

## 2019-04-22 DIAGNOSIS — N186 End stage renal disease: Secondary | ICD-10-CM | POA: Diagnosis not present

## 2019-04-22 DIAGNOSIS — D509 Iron deficiency anemia, unspecified: Secondary | ICD-10-CM | POA: Diagnosis not present

## 2019-04-22 DIAGNOSIS — E875 Hyperkalemia: Secondary | ICD-10-CM | POA: Diagnosis not present

## 2019-04-22 DIAGNOSIS — D631 Anemia in chronic kidney disease: Secondary | ICD-10-CM | POA: Diagnosis not present

## 2019-04-22 DIAGNOSIS — Z23 Encounter for immunization: Secondary | ICD-10-CM | POA: Diagnosis not present

## 2019-04-22 DIAGNOSIS — Z992 Dependence on renal dialysis: Secondary | ICD-10-CM | POA: Diagnosis not present

## 2019-04-22 DIAGNOSIS — N2581 Secondary hyperparathyroidism of renal origin: Secondary | ICD-10-CM | POA: Diagnosis not present

## 2019-04-23 DIAGNOSIS — Z79899 Other long term (current) drug therapy: Secondary | ICD-10-CM | POA: Diagnosis not present

## 2019-04-23 DIAGNOSIS — R0789 Other chest pain: Secondary | ICD-10-CM | POA: Diagnosis not present

## 2019-04-23 DIAGNOSIS — Z79891 Long term (current) use of opiate analgesic: Secondary | ICD-10-CM | POA: Diagnosis not present

## 2019-04-23 DIAGNOSIS — M545 Low back pain: Secondary | ICD-10-CM | POA: Diagnosis not present

## 2019-04-23 DIAGNOSIS — Z09 Encounter for follow-up examination after completed treatment for conditions other than malignant neoplasm: Secondary | ICD-10-CM | POA: Diagnosis not present

## 2019-04-23 DIAGNOSIS — G8929 Other chronic pain: Secondary | ICD-10-CM | POA: Diagnosis not present

## 2019-04-24 DIAGNOSIS — E875 Hyperkalemia: Secondary | ICD-10-CM | POA: Diagnosis not present

## 2019-04-24 DIAGNOSIS — N186 End stage renal disease: Secondary | ICD-10-CM | POA: Diagnosis not present

## 2019-04-24 DIAGNOSIS — Z992 Dependence on renal dialysis: Secondary | ICD-10-CM | POA: Diagnosis not present

## 2019-04-24 DIAGNOSIS — G894 Chronic pain syndrome: Secondary | ICD-10-CM | POA: Diagnosis not present

## 2019-04-24 DIAGNOSIS — D631 Anemia in chronic kidney disease: Secondary | ICD-10-CM | POA: Diagnosis not present

## 2019-04-24 DIAGNOSIS — D509 Iron deficiency anemia, unspecified: Secondary | ICD-10-CM | POA: Diagnosis not present

## 2019-04-24 DIAGNOSIS — Z5181 Encounter for therapeutic drug level monitoring: Secondary | ICD-10-CM | POA: Diagnosis not present

## 2019-04-24 DIAGNOSIS — Z79899 Other long term (current) drug therapy: Secondary | ICD-10-CM | POA: Diagnosis not present

## 2019-04-24 DIAGNOSIS — N2581 Secondary hyperparathyroidism of renal origin: Secondary | ICD-10-CM | POA: Diagnosis not present

## 2019-04-27 DIAGNOSIS — E875 Hyperkalemia: Secondary | ICD-10-CM | POA: Diagnosis not present

## 2019-04-27 DIAGNOSIS — D631 Anemia in chronic kidney disease: Secondary | ICD-10-CM | POA: Diagnosis not present

## 2019-04-27 DIAGNOSIS — N186 End stage renal disease: Secondary | ICD-10-CM | POA: Diagnosis not present

## 2019-04-27 DIAGNOSIS — Z992 Dependence on renal dialysis: Secondary | ICD-10-CM | POA: Diagnosis not present

## 2019-04-27 DIAGNOSIS — N2581 Secondary hyperparathyroidism of renal origin: Secondary | ICD-10-CM | POA: Diagnosis not present

## 2019-04-27 DIAGNOSIS — D509 Iron deficiency anemia, unspecified: Secondary | ICD-10-CM | POA: Diagnosis not present

## 2019-04-29 DIAGNOSIS — Z992 Dependence on renal dialysis: Secondary | ICD-10-CM | POA: Diagnosis not present

## 2019-04-29 DIAGNOSIS — Z86718 Personal history of other venous thrombosis and embolism: Secondary | ICD-10-CM | POA: Diagnosis not present

## 2019-04-29 DIAGNOSIS — D509 Iron deficiency anemia, unspecified: Secondary | ICD-10-CM | POA: Diagnosis not present

## 2019-04-29 DIAGNOSIS — E875 Hyperkalemia: Secondary | ICD-10-CM | POA: Diagnosis not present

## 2019-04-29 DIAGNOSIS — D631 Anemia in chronic kidney disease: Secondary | ICD-10-CM | POA: Diagnosis not present

## 2019-04-29 DIAGNOSIS — N186 End stage renal disease: Secondary | ICD-10-CM | POA: Diagnosis not present

## 2019-04-29 DIAGNOSIS — N2581 Secondary hyperparathyroidism of renal origin: Secondary | ICD-10-CM | POA: Diagnosis not present

## 2019-05-01 DIAGNOSIS — N186 End stage renal disease: Secondary | ICD-10-CM | POA: Diagnosis not present

## 2019-05-01 DIAGNOSIS — D509 Iron deficiency anemia, unspecified: Secondary | ICD-10-CM | POA: Diagnosis not present

## 2019-05-01 DIAGNOSIS — Z992 Dependence on renal dialysis: Secondary | ICD-10-CM | POA: Diagnosis not present

## 2019-05-01 DIAGNOSIS — N2581 Secondary hyperparathyroidism of renal origin: Secondary | ICD-10-CM | POA: Diagnosis not present

## 2019-05-01 DIAGNOSIS — E875 Hyperkalemia: Secondary | ICD-10-CM | POA: Diagnosis not present

## 2019-05-01 DIAGNOSIS — D631 Anemia in chronic kidney disease: Secondary | ICD-10-CM | POA: Diagnosis not present

## 2019-05-03 ENCOUNTER — Other Ambulatory Visit: Payer: Self-pay | Admitting: *Deleted

## 2019-05-03 NOTE — Patient Outreach (Signed)
Member assessed for potential [facility] Care Management needs as a benefit of [employer].  Per Patient Frank [name], Frank [name] has had multiple ED/hospital visits. Attempted to outreach to member on telephone numbers on file without success.  Collaboration with outpatient HD social worker to confirm best contact number for Frank [name]. Writer informed [phone] is the best number to reach Frank [name].  Telephone call made to above telephone number.  No answer. Left confidential voicemail message requesting call back.  Marthenia Rolling, MSN-Ed, RN,BSN [place] Acute Care Coordinator [phone] [facility]) [phone]  (Toll free office)

## 2019-05-04 DIAGNOSIS — N2581 Secondary hyperparathyroidism of renal origin: Secondary | ICD-10-CM | POA: Diagnosis not present

## 2019-05-04 DIAGNOSIS — Z992 Dependence on renal dialysis: Secondary | ICD-10-CM | POA: Diagnosis not present

## 2019-05-04 DIAGNOSIS — D631 Anemia in chronic kidney disease: Secondary | ICD-10-CM | POA: Diagnosis not present

## 2019-05-04 DIAGNOSIS — D509 Iron deficiency anemia, unspecified: Secondary | ICD-10-CM | POA: Diagnosis not present

## 2019-05-04 DIAGNOSIS — N186 End stage renal disease: Secondary | ICD-10-CM | POA: Diagnosis not present

## 2019-05-04 DIAGNOSIS — E875 Hyperkalemia: Secondary | ICD-10-CM | POA: Diagnosis not present

## 2019-05-05 ENCOUNTER — Emergency Department (HOSPITAL_COMMUNITY)
Admission: EM | Admit: 2019-05-05 | Discharge: 2019-05-06 | Disposition: A | Discharge status: Home / Self Care | Payer: Medicare Other | Source: Home / Self Care | Attending: Emergency Medicine | Admitting: Emergency Medicine

## 2019-05-05 DIAGNOSIS — G8929 Other chronic pain: Secondary | ICD-10-CM | POA: Diagnosis not present

## 2019-05-05 DIAGNOSIS — Z03818 Encounter for observation for suspected exposure to other biological agents ruled out: Secondary | ICD-10-CM | POA: Diagnosis not present

## 2019-05-05 DIAGNOSIS — N186 End stage renal disease: Secondary | ICD-10-CM | POA: Insufficient documentation

## 2019-05-05 DIAGNOSIS — I132 Hypertensive heart and chronic kidney disease with heart failure and with stage 5 chronic kidney disease, or end stage renal disease: Secondary | ICD-10-CM | POA: Insufficient documentation

## 2019-05-05 DIAGNOSIS — Z20828 Contact with and (suspected) exposure to other viral communicable diseases: Secondary | ICD-10-CM | POA: Diagnosis not present

## 2019-05-05 DIAGNOSIS — Z87891 Personal history of nicotine dependence: Secondary | ICD-10-CM | POA: Insufficient documentation

## 2019-05-05 DIAGNOSIS — R112 Nausea with vomiting, unspecified: Secondary | ICD-10-CM | POA: Diagnosis not present

## 2019-05-05 DIAGNOSIS — Z79899 Other long term (current) drug therapy: Secondary | ICD-10-CM | POA: Insufficient documentation

## 2019-05-05 DIAGNOSIS — R1084 Generalized abdominal pain: Secondary | ICD-10-CM

## 2019-05-05 DIAGNOSIS — I5042 Chronic combined systolic (congestive) and diastolic (congestive) heart failure: Secondary | ICD-10-CM | POA: Insufficient documentation

## 2019-05-05 DIAGNOSIS — E1122 Type 2 diabetes mellitus with diabetic chronic kidney disease: Secondary | ICD-10-CM | POA: Insufficient documentation

## 2019-05-05 DIAGNOSIS — J90 Pleural effusion, not elsewhere classified: Secondary | ICD-10-CM | POA: Diagnosis not present

## 2019-05-05 DIAGNOSIS — K861 Other chronic pancreatitis: Secondary | ICD-10-CM | POA: Diagnosis not present

## 2019-05-05 DIAGNOSIS — R1110 Vomiting, unspecified: Secondary | ICD-10-CM | POA: Insufficient documentation

## 2019-05-05 DIAGNOSIS — Z7901 Long term (current) use of anticoagulants: Secondary | ICD-10-CM | POA: Insufficient documentation

## 2019-05-05 DIAGNOSIS — K859 Acute pancreatitis without necrosis or infection, unspecified: Secondary | ICD-10-CM | POA: Diagnosis not present

## 2019-05-05 DIAGNOSIS — R1010 Upper abdominal pain, unspecified: Secondary | ICD-10-CM | POA: Diagnosis not present

## 2019-05-05 DIAGNOSIS — Z992 Dependence on renal dialysis: Secondary | ICD-10-CM | POA: Insufficient documentation

## 2019-05-05 DIAGNOSIS — K863 Pseudocyst of pancreas: Secondary | ICD-10-CM | POA: Diagnosis not present

## 2019-05-05 DIAGNOSIS — R109 Unspecified abdominal pain: Secondary | ICD-10-CM | POA: Diagnosis not present

## 2019-05-05 NOTE — ED Triage Notes (Signed)
Pt reports abd pain and vomiting for the past 3 days. Dialysis pt, recent admission 2 weeks ago

## 2019-05-06 ENCOUNTER — Other Ambulatory Visit: Payer: Self-pay

## 2019-05-06 ENCOUNTER — Emergency Department (HOSPITAL_COMMUNITY): Payer: Medicare Other

## 2019-05-06 ENCOUNTER — Encounter (HOSPITAL_COMMUNITY): Payer: Self-pay

## 2019-05-06 DIAGNOSIS — Z992 Dependence on renal dialysis: Secondary | ICD-10-CM | POA: Insufficient documentation

## 2019-05-06 DIAGNOSIS — R1010 Upper abdominal pain, unspecified: Secondary | ICD-10-CM | POA: Insufficient documentation

## 2019-05-06 DIAGNOSIS — G8929 Other chronic pain: Secondary | ICD-10-CM | POA: Insufficient documentation

## 2019-05-06 DIAGNOSIS — Z7901 Long term (current) use of anticoagulants: Secondary | ICD-10-CM | POA: Insufficient documentation

## 2019-05-06 DIAGNOSIS — E1122 Type 2 diabetes mellitus with diabetic chronic kidney disease: Secondary | ICD-10-CM | POA: Insufficient documentation

## 2019-05-06 DIAGNOSIS — Z79899 Other long term (current) drug therapy: Secondary | ICD-10-CM | POA: Insufficient documentation

## 2019-05-06 DIAGNOSIS — Z86718 Personal history of other venous thrombosis and embolism: Secondary | ICD-10-CM | POA: Insufficient documentation

## 2019-05-06 DIAGNOSIS — E785 Hyperlipidemia, unspecified: Secondary | ICD-10-CM | POA: Insufficient documentation

## 2019-05-06 DIAGNOSIS — Z87891 Personal history of nicotine dependence: Secondary | ICD-10-CM | POA: Insufficient documentation

## 2019-05-06 DIAGNOSIS — I5042 Chronic combined systolic (congestive) and diastolic (congestive) heart failure: Secondary | ICD-10-CM | POA: Insufficient documentation

## 2019-05-06 DIAGNOSIS — N186 End stage renal disease: Secondary | ICD-10-CM | POA: Insufficient documentation

## 2019-05-06 DIAGNOSIS — I132 Hypertensive heart and chronic kidney disease with heart failure and with stage 5 chronic kidney disease, or end stage renal disease: Secondary | ICD-10-CM | POA: Insufficient documentation

## 2019-05-06 DIAGNOSIS — R112 Nausea with vomiting, unspecified: Secondary | ICD-10-CM | POA: Insufficient documentation

## 2019-05-06 DIAGNOSIS — J90 Pleural effusion, not elsewhere classified: Secondary | ICD-10-CM | POA: Diagnosis not present

## 2019-05-06 DIAGNOSIS — K861 Other chronic pancreatitis: Secondary | ICD-10-CM | POA: Diagnosis not present

## 2019-05-06 DIAGNOSIS — Z7984 Long term (current) use of oral hypoglycemic drugs: Secondary | ICD-10-CM | POA: Insufficient documentation

## 2019-05-06 LAB — COMPREHENSIVE METABOLIC PANEL
ALT: 11 U/L (ref 0–44)
AST: 14 U/L — ABNORMAL LOW (ref 15–41)
Albumin: 3.3 g/dL — ABNORMAL LOW (ref 3.5–5.0)
Alkaline Phosphatase: 186 U/L — ABNORMAL HIGH (ref 38–126)
Anion gap: 19 — ABNORMAL HIGH (ref 5–15)
BUN: 56 mg/dL — ABNORMAL HIGH (ref 6–20)
CO2: 24 mmol/L (ref 22–32)
Calcium: 8.2 mg/dL — ABNORMAL LOW (ref 8.9–10.3)
Chloride: 97 mmol/L — ABNORMAL LOW (ref 98–111)
Creatinine, Ser: 12.11 mg/dL — ABNORMAL HIGH (ref 0.61–1.24)
GFR calc Af Amer: 5 mL/min — ABNORMAL LOW (ref >60)
GFR calc non Af Amer: 4 mL/min — ABNORMAL LOW (ref >60)
Glucose, Bld: 71 mg/dL (ref 70–99)
Potassium: 5.1 mmol/L (ref 3.5–5.1)
Sodium: 140 mmol/L (ref 135–145)
Total Bilirubin: 0.7 mg/dL (ref 0.3–1.2)
Total Protein: 8 g/dL (ref 6.5–8.1)

## 2019-05-06 LAB — CBC WITH DIFFERENTIAL/PLATELET
Abs Immature Granulocytes: 0.03 10*3/uL (ref 0.00–0.07)
Basophils Absolute: 0 10*3/uL (ref 0.0–0.1)
Basophils Relative: 0 %
Eosinophils Absolute: 0.2 10*3/uL (ref 0.0–0.5)
Eosinophils Relative: 3 %
HCT: 34.3 % — ABNORMAL LOW (ref 39.0–52.0)
Hemoglobin: 10.9 g/dL — ABNORMAL LOW (ref 13.0–17.0)
Immature Granulocytes: 1 %
Lymphocytes Relative: 13 %
Lymphs Abs: 0.6 10*3/uL — ABNORMAL LOW (ref 0.7–4.0)
MCH: 28.8 pg (ref 26.0–34.0)
MCHC: 31.8 g/dL (ref 30.0–36.0)
MCV: 90.5 fL (ref 80.0–100.0)
Monocytes Absolute: 0.7 10*3/uL (ref 0.1–1.0)
Monocytes Relative: 16 %
Neutro Abs: 3 10*3/uL (ref 1.7–7.7)
Neutrophils Relative %: 67 %
Platelets: 132 10*3/uL — ABNORMAL LOW (ref 150–400)
RBC: 3.79 MIL/uL — ABNORMAL LOW (ref 4.22–5.81)
RDW: 17.4 % — ABNORMAL HIGH (ref 11.5–15.5)
WBC: 4.5 10*3/uL (ref 4.0–10.5)
nRBC: 0 % (ref 0.0–0.2)

## 2019-05-06 LAB — LIPASE, BLOOD: Lipase: 217 U/L — ABNORMAL HIGH (ref 11–51)

## 2019-05-06 MED ORDER — ONDANSETRON 4 MG PO TBDP
4.0000 mg | ORAL_TABLET | Freq: Once | ORAL | Status: AC
  Administered 2019-05-06: 4 mg via ORAL
  Filled 2019-05-06: qty 1

## 2019-05-06 MED ORDER — FENTANYL CITRATE (PF) 100 MCG/2ML IJ SOLN
100.0000 ug | Freq: Once | INTRAMUSCULAR | Status: AC
  Administered 2019-05-06: 100 ug via INTRAVENOUS
  Filled 2019-05-06: qty 2

## 2019-05-06 MED ORDER — PROMETHAZINE HCL 12.5 MG PO TABS: 25.0000 mg | ORAL_TABLET | Freq: Four times a day (QID) | ORAL | 0 refills | Status: DC | PRN

## 2019-05-06 MED ORDER — PROCHLORPERAZINE EDISYLATE 10 MG/2ML IJ SOLN
10.0000 mg | Freq: Once | INTRAMUSCULAR | Status: AC
  Administered 2019-05-06: 10 mg via INTRAVENOUS
  Filled 2019-05-06: qty 2

## 2019-05-06 MED ORDER — PROMETHAZINE HCL 12.5 MG PO TABS: 12.5000 mg | ORAL_TABLET | Freq: Four times a day (QID) | ORAL | 0 refills | Status: DC | PRN

## 2019-05-06 MED ORDER — HYDROMORPHONE HCL 1 MG/ML IJ SOLN
1.0000 mg | Freq: Once | INTRAMUSCULAR | Status: AC
  Administered 2019-05-06: 1 mg via INTRAVENOUS
  Filled 2019-05-06: qty 1

## 2019-05-06 NOTE — ED Notes (Signed)
Pt refusing lab draw at this time, stating phlebotomy can come back since he has already been here 9 hours

## 2019-05-06 NOTE — ED Triage Notes (Signed)
Pt coming from home c/o pancreatitis flare up. N/V. Seen at [facility] earlier for same thing and stated he doesn't feel better yet.

## 2019-05-06 NOTE — ED Notes (Signed)
Pt approached this tech asking about wait time. When this tech informed pt the wait pt began yelling and cussing stating "youre not fucking running this place right"

## 2019-05-06 NOTE — ED Notes (Signed)
Pt has not been seen in waiting room for over 30 mins.

## 2019-05-06 NOTE — ED Notes (Signed)
Pt refused CT scan at this time, states they can come back in 5-10

## 2019-05-06 NOTE — ED Notes (Signed)
Pt approached this tech a second time telling me "I been coming here 25 years and you are not running this place right".

## 2019-05-06 NOTE — ED Notes (Signed)
Pt verbalized understanding of d/c instructions and has no further questions. VSS, NAD.

## 2019-05-06 NOTE — ED Notes (Signed)
This tech informed pt earlier in the night to not eat or drink anything until seen by a dr. Pt now at vending machine buying snacks and soda.

## 2019-05-06 NOTE — ED Notes (Signed)
Pt refused blood work in triage.  

## 2019-05-06 NOTE — ED Notes (Signed)
This tech attempted to check blood pressure. Pt stated that blood pressures can only be taken on legs. Blood pressure on right leg read 62/42 and left leg 60/38. Triage RN informed.
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
## 2019-05-06 NOTE — ED Notes (Signed)
Patient transported to CT 

## 2019-05-07 ENCOUNTER — Other Ambulatory Visit: Payer: Self-pay

## 2019-05-07 ENCOUNTER — Inpatient Hospital Stay (HOSPITAL_COMMUNITY)
Admission: EM | Admit: 2019-05-07 | Discharge: 2019-05-12 | DRG: 438 | Disposition: A | Discharge status: Home / Self Care | Payer: Medicare Other | Attending: Internal Medicine | Admitting: Internal Medicine

## 2019-05-07 ENCOUNTER — Encounter (HOSPITAL_COMMUNITY): Payer: Self-pay | Admitting: Emergency Medicine

## 2019-05-07 ENCOUNTER — Emergency Department (HOSPITAL_COMMUNITY)
Admission: EM | Admit: 2019-05-07 | Discharge: 2019-05-07 | Disposition: A | Discharge status: Home / Self Care | Payer: Medicare Other | Source: Home / Self Care | Attending: Emergency Medicine | Admitting: Emergency Medicine

## 2019-05-07 DIAGNOSIS — R112 Nausea with vomiting, unspecified: Secondary | ICD-10-CM

## 2019-05-07 DIAGNOSIS — Z8249 Family history of ischemic heart disease and other diseases of the circulatory system: Secondary | ICD-10-CM

## 2019-05-07 DIAGNOSIS — Z03818 Encounter for observation for suspected exposure to other biological agents ruled out: Secondary | ICD-10-CM | POA: Diagnosis not present

## 2019-05-07 DIAGNOSIS — K859 Acute pancreatitis without necrosis or infection, unspecified: Principal | ICD-10-CM | POA: Diagnosis present

## 2019-05-07 DIAGNOSIS — G8929 Other chronic pain: Secondary | ICD-10-CM

## 2019-05-07 DIAGNOSIS — K7460 Unspecified cirrhosis of liver: Secondary | ICD-10-CM | POA: Diagnosis present

## 2019-05-07 DIAGNOSIS — K219 Gastro-esophageal reflux disease without esophagitis: Secondary | ICD-10-CM | POA: Diagnosis present

## 2019-05-07 DIAGNOSIS — F418 Other specified anxiety disorders: Secondary | ICD-10-CM | POA: Diagnosis present

## 2019-05-07 DIAGNOSIS — E860 Dehydration: Secondary | ICD-10-CM

## 2019-05-07 DIAGNOSIS — T8612 Kidney transplant failure: Secondary | ICD-10-CM | POA: Diagnosis present

## 2019-05-07 DIAGNOSIS — K861 Other chronic pancreatitis: Secondary | ICD-10-CM | POA: Diagnosis present

## 2019-05-07 DIAGNOSIS — D631 Anemia in chronic kidney disease: Secondary | ICD-10-CM | POA: Diagnosis present

## 2019-05-07 DIAGNOSIS — Z86711 Personal history of pulmonary embolism: Secondary | ICD-10-CM

## 2019-05-07 DIAGNOSIS — Z9115 Patient's noncompliance with renal dialysis: Secondary | ICD-10-CM

## 2019-05-07 DIAGNOSIS — E785 Hyperlipidemia, unspecified: Secondary | ICD-10-CM | POA: Diagnosis present

## 2019-05-07 DIAGNOSIS — IMO0002 Reserved for concepts with insufficient information to code with codable children: Secondary | ICD-10-CM | POA: Diagnosis present

## 2019-05-07 DIAGNOSIS — Z79899 Other long term (current) drug therapy: Secondary | ICD-10-CM

## 2019-05-07 DIAGNOSIS — R109 Unspecified abdominal pain: Secondary | ICD-10-CM | POA: Diagnosis not present

## 2019-05-07 DIAGNOSIS — E1129 Type 2 diabetes mellitus with other diabetic kidney complication: Secondary | ICD-10-CM | POA: Diagnosis present

## 2019-05-07 DIAGNOSIS — Z91018 Allergy to other foods: Secondary | ICD-10-CM

## 2019-05-07 DIAGNOSIS — I152 Hypertension secondary to endocrine disorders: Secondary | ICD-10-CM | POA: Diagnosis present

## 2019-05-07 DIAGNOSIS — N2581 Secondary hyperparathyroidism of renal origin: Secondary | ICD-10-CM | POA: Diagnosis present

## 2019-05-07 DIAGNOSIS — E1159 Type 2 diabetes mellitus with other circulatory complications: Secondary | ICD-10-CM | POA: Diagnosis present

## 2019-05-07 DIAGNOSIS — I132 Hypertensive heart and chronic kidney disease with heart failure and with stage 5 chronic kidney disease, or end stage renal disease: Secondary | ICD-10-CM | POA: Diagnosis present

## 2019-05-07 DIAGNOSIS — Z86718 Personal history of other venous thrombosis and embolism: Secondary | ICD-10-CM

## 2019-05-07 DIAGNOSIS — I10 Essential (primary) hypertension: Secondary | ICD-10-CM | POA: Diagnosis present

## 2019-05-07 DIAGNOSIS — Z885 Allergy status to narcotic agent status: Secondary | ICD-10-CM

## 2019-05-07 DIAGNOSIS — I5042 Chronic combined systolic (congestive) and diastolic (congestive) heart failure: Secondary | ICD-10-CM | POA: Diagnosis present

## 2019-05-07 DIAGNOSIS — Z7901 Long term (current) use of anticoagulants: Secondary | ICD-10-CM

## 2019-05-07 DIAGNOSIS — E1122 Type 2 diabetes mellitus with diabetic chronic kidney disease: Secondary | ICD-10-CM | POA: Diagnosis present

## 2019-05-07 DIAGNOSIS — Z992 Dependence on renal dialysis: Secondary | ICD-10-CM

## 2019-05-07 DIAGNOSIS — Z20828 Contact with and (suspected) exposure to other viral communicable diseases: Secondary | ICD-10-CM | POA: Diagnosis present

## 2019-05-07 DIAGNOSIS — K863 Pseudocyst of pancreas: Secondary | ICD-10-CM | POA: Diagnosis present

## 2019-05-07 DIAGNOSIS — Z87891 Personal history of nicotine dependence: Secondary | ICD-10-CM

## 2019-05-07 DIAGNOSIS — Y830 Surgical operation with transplant of whole organ as the cause of abnormal reaction of the patient, or of later complication, without mention of misadventure at the time of the procedure: Secondary | ICD-10-CM | POA: Diagnosis present

## 2019-05-07 DIAGNOSIS — I428 Other cardiomyopathies: Secondary | ICD-10-CM | POA: Diagnosis present

## 2019-05-07 DIAGNOSIS — N186 End stage renal disease: Secondary | ICD-10-CM | POA: Diagnosis present

## 2019-05-07 DIAGNOSIS — E875 Hyperkalemia: Secondary | ICD-10-CM | POA: Diagnosis present

## 2019-05-07 DIAGNOSIS — Z9119 Patient's noncompliance with other medical treatment and regimen: Secondary | ICD-10-CM

## 2019-05-07 MED ORDER — ONDANSETRON 4 MG PO TBDP
ORAL_TABLET | ORAL | Status: AC
  Filled 2019-05-07: qty 1

## 2019-05-07 MED ORDER — HYDROMORPHONE HCL 1 MG/ML IJ SOLN
1.0000 mg | Freq: Once | INTRAMUSCULAR | Status: AC
  Administered 2019-05-07: 1 mg via INTRAMUSCULAR
  Filled 2019-05-07: qty 1

## 2019-05-07 MED ORDER — ONDANSETRON 4 MG PO TBDP
4.0000 mg | ORAL_TABLET | Freq: Once | ORAL | Status: AC
  Administered 2019-05-07: 4 mg via ORAL
  Filled 2019-05-07: qty 1

## 2019-05-07 NOTE — ED Triage Notes (Signed)
Patient complaining of nausea and vomiting due to his pancreatitis. Patient gets dialysis. [facility].

## 2019-05-07 NOTE — ED Notes (Signed)
Patient has a smell of weed on him
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
## 2019-05-07 NOTE — Discharge Instructions (Signed)
Continue your home oxycodone for pain.  Can use phenergan from Dr. Ronnald Nian for nausea. Follow-up today for your dialysis as scheduled-- make sure not to miss this. Return here for any new/acute changes.

## 2019-05-08 ENCOUNTER — Encounter (HOSPITAL_COMMUNITY): Payer: Self-pay | Admitting: General Practice

## 2019-05-08 DIAGNOSIS — N186 End stage renal disease: Secondary | ICD-10-CM | POA: Diagnosis not present

## 2019-05-08 DIAGNOSIS — R109 Unspecified abdominal pain: Secondary | ICD-10-CM | POA: Diagnosis not present

## 2019-05-08 DIAGNOSIS — I10 Essential (primary) hypertension: Secondary | ICD-10-CM | POA: Diagnosis not present

## 2019-05-08 DIAGNOSIS — K861 Other chronic pancreatitis: Secondary | ICD-10-CM | POA: Diagnosis not present

## 2019-05-08 DIAGNOSIS — E875 Hyperkalemia: Secondary | ICD-10-CM | POA: Diagnosis not present

## 2019-05-08 DIAGNOSIS — Z992 Dependence on renal dialysis: Secondary | ICD-10-CM | POA: Diagnosis not present

## 2019-05-08 LAB — CBC WITH DIFFERENTIAL/PLATELET
Abs Immature Granulocytes: 0.02 10*3/uL (ref 0.00–0.07)
Basophils Absolute: 0 10*3/uL (ref 0.0–0.1)
Basophils Relative: 0 %
Eosinophils Absolute: 0.1 10*3/uL (ref 0.0–0.5)
Eosinophils Relative: 3 %
HCT: 30.5 % — ABNORMAL LOW (ref 39.0–52.0)
Hemoglobin: 9.6 g/dL — ABNORMAL LOW (ref 13.0–17.0)
Immature Granulocytes: 0 %
Lymphocytes Relative: 17 %
Lymphs Abs: 0.8 10*3/uL (ref 0.7–4.0)
MCH: 28.2 pg (ref 26.0–34.0)
MCHC: 31.5 g/dL (ref 30.0–36.0)
MCV: 89.7 fL (ref 80.0–100.0)
Monocytes Absolute: 0.7 10*3/uL (ref 0.1–1.0)
Monocytes Relative: 14 %
Neutro Abs: 3 10*3/uL (ref 1.7–7.7)
Neutrophils Relative %: 66 %
Platelets: 150 10*3/uL (ref 150–400)
RBC: 3.4 MIL/uL — ABNORMAL LOW (ref 4.22–5.81)
RDW: 17.3 % — ABNORMAL HIGH (ref 11.5–15.5)
WBC: 4.6 10*3/uL (ref 4.0–10.5)
nRBC: 0 % (ref 0.0–0.2)

## 2019-05-08 LAB — COMPREHENSIVE METABOLIC PANEL
ALT: 9 U/L (ref 0–44)
AST: 12 U/L — ABNORMAL LOW (ref 15–41)
Albumin: 3.4 g/dL — ABNORMAL LOW (ref 3.5–5.0)
Alkaline Phosphatase: 168 U/L — ABNORMAL HIGH (ref 38–126)
Anion gap: 20 — ABNORMAL HIGH (ref 5–15)
BUN: 85 mg/dL — ABNORMAL HIGH (ref 6–20)
CO2: 22 mmol/L (ref 22–32)
Calcium: 8.1 mg/dL — ABNORMAL LOW (ref 8.9–10.3)
Chloride: 98 mmol/L (ref 98–111)
Creatinine, Ser: 15.1 mg/dL — ABNORMAL HIGH (ref 0.61–1.24)
GFR calc Af Amer: 4 mL/min — ABNORMAL LOW (ref >60)
GFR calc non Af Amer: 3 mL/min — ABNORMAL LOW (ref >60)
Glucose, Bld: 66 mg/dL — ABNORMAL LOW (ref 70–99)
Potassium: 6.4 mmol/L (ref 3.5–5.1)
Sodium: 140 mmol/L (ref 135–145)
Total Bilirubin: 0.8 mg/dL (ref 0.3–1.2)
Total Protein: 7.5 g/dL (ref 6.5–8.1)

## 2019-05-08 LAB — SARS CORONAVIRUS 2 BY RT PCR (HOSPITAL ORDER, PERFORMED IN ~~LOC~~ HOSPITAL LAB): SARS Coronavirus 2: NEGATIVE

## 2019-05-08 LAB — CBG MONITORING, ED
Glucose-Capillary: 80 mg/dL (ref 70–99)
Glucose-Capillary: 63 mg/dL — ABNORMAL LOW (ref 70–99)
Glucose-Capillary: 57 mg/dL — ABNORMAL LOW (ref 70–99)
Glucose-Capillary: 79 mg/dL (ref 70–99)

## 2019-05-08 LAB — GLUCOSE, CAPILLARY: Glucose-Capillary: 65 mg/dL — ABNORMAL LOW (ref 70–99)

## 2019-05-08 LAB — POTASSIUM: Potassium: 5.9 mmol/L — ABNORMAL HIGH (ref 3.5–5.1)

## 2019-05-08 LAB — LIPASE, BLOOD: Lipase: 154 U/L — ABNORMAL HIGH (ref 11–51)

## 2019-05-08 MED ORDER — INSULIN ASPART 100 UNIT/ML ~~LOC~~ SOLN
0.0000 [IU] | SUBCUTANEOUS | Status: DC
  Filled 2019-05-08: qty 0.09

## 2019-05-08 MED ORDER — SODIUM CHLORIDE 0.9 % IV BOLUS: 1000.0000 mL | Freq: Once | INTRAVENOUS | Status: DC

## 2019-05-08 MED ORDER — HYDROMORPHONE HCL 1 MG/ML IJ SOLN: 1.0000 mg | Freq: Once | INTRAMUSCULAR | Status: DC

## 2019-05-08 MED ORDER — HEPARIN SODIUM (PORCINE) 5000 UNIT/ML IJ SOLN: 5000.0000 [IU] | Freq: Three times a day (TID) | INTRAMUSCULAR | Status: DC

## 2019-05-08 MED ORDER — HYDROMORPHONE HCL 1 MG/ML IJ SOLN
1.0000 mg | Freq: Once | INTRAMUSCULAR | Status: AC
  Administered 2019-05-08: 1 mg via INTRAMUSCULAR
  Filled 2019-05-08: qty 1

## 2019-05-08 MED ORDER — OXYCODONE HCL 5 MG PO TABS
5.0000 mg | ORAL_TABLET | Freq: Once | ORAL | Status: DC
  Filled 2019-05-08 (×3): qty 1

## 2019-05-08 MED ORDER — HYDRALAZINE HCL 20 MG/ML IJ SOLN
10.0000 mg | Freq: Four times a day (QID) | INTRAMUSCULAR | Status: DC | PRN
  Filled 2019-05-08: qty 1

## 2019-05-08 MED ORDER — GLUCOSE 4 G PO CHEW
2.0000 | CHEWABLE_TABLET | Freq: Once | ORAL | Status: AC
  Administered 2019-05-08: 8 g via ORAL
  Filled 2019-05-08: qty 2

## 2019-05-08 MED ORDER — PROMETHAZINE HCL 25 MG/ML IJ SOLN
25.0000 mg | Freq: Once | INTRAMUSCULAR | Status: AC
  Administered 2019-05-08: 25 mg via INTRAMUSCULAR
  Filled 2019-05-08: qty 1

## 2019-05-08 MED ORDER — HYDROMORPHONE HCL 1 MG/ML IJ SOLN
1.0000 mg | Freq: Once | INTRAMUSCULAR | Status: AC
  Administered 2019-05-08: 1 mg via INTRAMUSCULAR

## 2019-05-08 MED ORDER — SODIUM CHLORIDE 0.9% FLUSH: 3.0000 mL | INTRAVENOUS | Status: DC | PRN

## 2019-05-08 MED ORDER — CHLORHEXIDINE GLUCONATE CLOTH 2 % EX PADS
6.0000 | MEDICATED_PAD | Freq: Every day | CUTANEOUS | Status: DC
  Administered 2019-05-09 – 2019-05-10 (×2): 6 via TOPICAL

## 2019-05-08 MED ORDER — DIPHENHYDRAMINE HCL 25 MG PO CAPS
ORAL_CAPSULE | ORAL | Status: AC
  Filled 2019-05-08: qty 1

## 2019-05-08 MED ORDER — HYDROMORPHONE HCL 1 MG/ML IJ SOLN
0.5000 mg | Freq: Once | INTRAMUSCULAR | Status: DC
  Filled 2019-05-08: qty 1

## 2019-05-08 MED ORDER — ONDANSETRON HCL 4 MG/2ML IJ SOLN: 4.0000 mg | Freq: Four times a day (QID) | INTRAMUSCULAR | Status: DC | PRN

## 2019-05-08 MED ORDER — HYDRALAZINE HCL 50 MG PO TABS
100.0000 mg | ORAL_TABLET | Freq: Three times a day (TID) | ORAL | Status: DC
  Administered 2019-05-08 – 2019-05-12 (×10): 100 mg via ORAL
  Filled 2019-05-08 (×10): qty 2

## 2019-05-08 MED ORDER — HYDROMORPHONE HCL 1 MG/ML IJ SOLN
0.5000 mg | INTRAMUSCULAR | Status: DC | PRN
  Administered 2019-05-08 – 2019-05-09 (×3): 0.5 mg via INTRAVENOUS
  Filled 2019-05-08 (×3): qty 1

## 2019-05-08 MED ORDER — HYDROMORPHONE HCL 1 MG/ML IJ SOLN: 0.5000 mg | INTRAMUSCULAR | Status: DC | PRN

## 2019-05-08 MED ORDER — DIPHENHYDRAMINE HCL 25 MG PO CAPS
25.0000 mg | ORAL_CAPSULE | Freq: Three times a day (TID) | ORAL | Status: DC | PRN
  Administered 2019-05-08: 25 mg via ORAL
  Filled 2019-05-08: qty 1

## 2019-05-08 MED ORDER — HYDRALAZINE HCL 50 MG PO TABS
50.0000 mg | ORAL_TABLET | Freq: Three times a day (TID) | ORAL | Status: DC
  Filled 2019-05-08 (×2): qty 1

## 2019-05-08 MED ORDER — HYDROMORPHONE HCL 1 MG/ML IJ SOLN
INTRAMUSCULAR | Status: AC
  Administered 2019-05-08: 0.5 mg via INTRAVENOUS
  Filled 2019-05-08: qty 0.5

## 2019-05-08 MED ORDER — HYDROMORPHONE HCL 1 MG/ML IJ SOLN
1.0000 mg | Freq: Once | INTRAMUSCULAR | Status: DC
  Filled 2019-05-08: qty 1

## 2019-05-08 MED ORDER — AMLODIPINE BESYLATE 5 MG PO TABS: 5.0000 mg | ORAL_TABLET | Freq: Every day | ORAL | Status: DC

## 2019-05-08 MED ORDER — APIXABAN 5 MG PO TABS
5.0000 mg | ORAL_TABLET | Freq: Two times a day (BID) | ORAL | Status: DC
  Administered 2019-05-08 – 2019-05-12 (×7): 5 mg via ORAL
  Filled 2019-05-08 (×10): qty 1

## 2019-05-08 MED ORDER — SENNOSIDES-DOCUSATE SODIUM 8.6-50 MG PO TABS
2.0000 | ORAL_TABLET | Freq: Every day | ORAL | Status: DC
  Filled 2019-05-08 (×2): qty 2

## 2019-05-08 MED ORDER — SODIUM ZIRCONIUM CYCLOSILICATE 10 G PO PACK
10.0000 g | PACK | Freq: Once | ORAL | Status: AC
  Administered 2019-05-08: 10 g via ORAL
  Filled 2019-05-08: qty 1

## 2019-05-08 MED ORDER — CALCIUM GLUCONATE-NACL 1-0.675 GM/50ML-% IV SOLN
1.0000 g | Freq: Once | INTRAVENOUS | Status: AC
  Administered 2019-05-08: 1000 mg via INTRAVENOUS
  Filled 2019-05-08: qty 50

## 2019-05-08 MED ORDER — ONDANSETRON 4 MG PO TBDP: 4.0000 mg | ORAL_TABLET | Freq: Three times a day (TID) | ORAL | 0 refills | Status: DC | PRN

## 2019-05-08 MED ORDER — CYCLOBENZAPRINE HCL 10 MG PO TABS
10.0000 mg | ORAL_TABLET | Freq: Three times a day (TID) | ORAL | Status: DC | PRN
  Filled 2019-05-08: qty 1

## 2019-05-08 MED ORDER — PROMETHAZINE HCL 25 MG/ML IJ SOLN
12.5000 mg | Freq: Four times a day (QID) | INTRAMUSCULAR | Status: DC | PRN
  Administered 2019-05-11: 12.5 mg via INTRAVENOUS
  Filled 2019-05-08: qty 1

## 2019-05-08 MED ORDER — METOPROLOL TARTRATE 5 MG/5ML IV SOLN
5.0000 mg | Freq: Four times a day (QID) | INTRAVENOUS | Status: DC
  Administered 2019-05-09 – 2019-05-10 (×3): 5 mg via INTRAVENOUS
  Filled 2019-05-08 (×3): qty 5

## 2019-05-08 MED ORDER — SODIUM BICARBONATE 8.4 % IV SOLN
50.0000 meq | Freq: Once | INTRAVENOUS | Status: AC
  Administered 2019-05-08: 50 meq via INTRAVENOUS
  Filled 2019-05-08: qty 50

## 2019-05-08 MED ORDER — SODIUM CHLORIDE 0.9% FLUSH
3.0000 mL | Freq: Two times a day (BID) | INTRAVENOUS | Status: DC
  Administered 2019-05-08 – 2019-05-10 (×4): 3 mL via INTRAVENOUS
  Administered 2019-05-10: 10 mL via INTRAVENOUS

## 2019-05-08 MED ORDER — SODIUM CHLORIDE 0.9 % IV SOLN: 250.0000 mL | INTRAVENOUS | Status: DC | PRN

## 2019-05-08 MED ORDER — AMLODIPINE BESYLATE 5 MG PO TABS
5.0000 mg | ORAL_TABLET | Freq: Every day | ORAL | Status: DC
  Administered 2019-05-09 – 2019-05-10 (×2): 5 mg via ORAL
  Filled 2019-05-08 (×2): qty 1

## 2019-05-08 MED ORDER — PANTOPRAZOLE SODIUM 40 MG PO TBEC
40.0000 mg | DELAYED_RELEASE_TABLET | Freq: Every day | ORAL | Status: DC
  Administered 2019-05-09 – 2019-05-12 (×4): 40 mg via ORAL
  Filled 2019-05-08 (×4): qty 1

## 2019-05-08 NOTE — ED Notes (Signed)
Charge nurse attempting IV and blood work.

## 2019-05-08 NOTE — ED Notes (Signed)
Nephrology at bedside.

## 2019-05-08 NOTE — ED Notes (Signed)
Pt demanding to speak with doctor. Dr. Maudie Mercury made aware.

## 2019-05-08 NOTE — ED Provider Notes (Addendum)
Care assumed from S. Upstill PA-C at shift change pending labs.  See her note for full H&P.   Plan is dispo if labs reassuring and pain is controlled.  Briefly patient dialyzes at [facility] T/TH/S but missed his session on Thursday.  He had a make-up session yesterday but was only able to dialyze for an hour and a half because his abdominal pain was too severe.  He has known acute on chronic pancreatitis.  He has had very little p.o. intake at home secondary to pain and nausea.  Per previous provider patient is a difficult stick.  An ultrasound IV was placed but fell out. Pt does have history of prolonged QTC, today it is 520.  Physical Exam  BP (!) 174/95   Pulse 74   Temp 98 F (36.7 C) (Oral)   Resp 16   Ht _0  (1.88 m)   Wt 75.3 kg   SpO2 100%   BMI 21.31 kg/m   Physical Exam  PE: Constitutional: well-developed, well-nourished, no apparent distress HENT: normocephalic, atraumatic. no cervical adenopathy.  Mucous membranes are dry. Cardiovascular: normal rate and rhythm, distal pulses intact Pulmonary/Chest: effort normal; breath sounds clear and equal bilaterally; no wheezes or rales Abdominal: Tenderness to palpation of epigastric area with voluntary guarding.  No peritoneal signs. Musculoskeletal: full ROM, no edema Neurological: alert with goal directed thinking Skin: warm and dry, no rash, no diaphoresis Psychiatric: normal mood and affect, normal behavior   .Critical Care Performed by: Cherre Robins, PA-C Authorized by: Cherre Robins, PA-C   Critical care provider statement:    Critical care time (minutes):  45   Critical care time was exclusive of:  Separately billable procedures and treating other patients and teaching time   Critical care was necessary to treat or prevent imminent or life-threatening deterioration of the following conditions:  Metabolic crisis   Critical care was time spent personally by me on the following activities:  Discussions with  consultants, evaluation of patient's response to treatment, examination of patient, obtaining history from patient or surrogate, ordering and review of laboratory studies, ordering and review of radiographic studies, review of old charts, re-evaluation of patient's condition, ordering and performing treatments and interventions and development of treatment plan with patient or surrogate   I assumed direction of critical care for this patient from another provider in my specialty: no        ED Course/Procedures   Results for orders placed or performed during the hospital encounter of 05/07/19 (from the past 24 hour(s))  Lipase, blood     Status: Abnormal   Collection Time: 05/08/19 12:10 AM  Result Value Ref Range   Lipase 154 (H) 11 - 51 U/L  CBG monitoring, ED     Status: None   Collection Time: 05/08/19  4:23 AM  Result Value Ref Range   Glucose-Capillary 80 70 - 99 mg/dL  CBC with Differential/Platelet     Status: Abnormal   Collection Time: 05/08/19  7:47 AM  Result Value Ref Range   WBC 4.6 4.0 - 10.5 K/uL   RBC 3.40 (L) 4.22 - 5.81 MIL/uL   Hemoglobin 9.6 (L) 13.0 - 17.0 g/dL   HCT 30.5 (L) 39.0 - 52.0 %   MCV 89.7 80.0 - 100.0 fL   MCH 28.2 26.0 - 34.0 pg   MCHC 31.5 30.0 - 36.0 g/dL   RDW 17.3 (H) 11.5 - 15.5 %   Platelets 150 150 - 400 K/uL   nRBC 0.0 0.0 -  0.2 %   Neutrophils Relative % 66 %   Neutro Abs 3.0 1.7 - 7.7 K/uL   Lymphocytes Relative 17 %   Lymphs Abs 0.8 0.7 - 4.0 K/uL   Monocytes Relative 14 %   Monocytes Absolute 0.7 0.1 - 1.0 K/uL   Eosinophils Relative 3 %   Eosinophils Absolute 0.1 0.0 - 0.5 K/uL   Basophils Relative 0 %   Basophils Absolute 0.0 0.0 - 0.1 K/uL   Immature Granulocytes 0 %   Abs Immature Granulocytes 0.02 0.00 - 0.07 K/uL  Comprehensive metabolic panel     Status: Abnormal   Collection Time: 05/08/19  7:47 AM  Result Value Ref Range   Sodium 140 135 - 145 mmol/L   Potassium 6.4 (HH) 3.5 - 5.1 mmol/L   Chloride 98 98 - 111  mmol/L   CO2 22 22 - 32 mmol/L   Glucose, Bld 66 (L) 70 - 99 mg/dL   BUN 85 (H) 6 - 20 mg/dL   Creatinine, Ser 15.10 (H) 0.61 - 1.24 mg/dL   Calcium 8.1 (L) 8.9 - 10.3 mg/dL   Total Protein 7.5 6.5 - 8.1 g/dL   Albumin 3.4 (L) 3.5 - 5.0 g/dL   AST 12 (L) 15 - 41 U/L   ALT 9 0 - 44 U/L   Alkaline Phosphatase 168 (H) 38 - 126 U/L   Total Bilirubin 0.8 0.3 - 1.2 mg/dL   GFR calc non Af Amer 3 (L) >60 mL/min   GFR calc Af Amer 4 (L) >60 mL/min   Anion gap 20 (H) 5 - 15    EKG Interpretation  Date/Time:  Saturday May 08 2019 10:38:23 EDT Ventricular Rate:  72 PR Interval:    QRS Duration: 104 QT Interval:  475 QTC Calculation: 520 R Axis:   -23 Text Interpretation:  Sinus rhythm Borderline low voltage, extremity leads Probable left ventricular hypertrophy Nonspecific T abnormalities, lateral leads Prolonged QT interval No significant change since last tracing Confirmed by Dorie Rank [phone]) on 05/08/2019 10:45:29 AM         MDM    53 yo male with PMH of DM, pancreatitis, chronic abdominal pain, hypertension, ESRD hemodialysis, CHF, cirrhosis, PE on Eliquis presents with abdominal pain x 3 days.  This is his third ED visit in the last 2 days.  Labs today are significant for lipase of 154.  This is improved compared to 2 days ago and it was 217.  CMP shows hyperkalemia of 6.4.  There is noted there is slight hemolysis however given this elevation will order Lokelma.  Patient also has worsening kidney function, creatinine today is 15.10, again 2 days ago it was improved at 12.11.  Patient also has anion gap of 20, chart review shows this is chronic and close to his baseline.  Patient does have blood sugar of 66, he is tolerating p.o. intake will give p.o. fluids as he does not have IV at this time.  IV team consult is pending.  Will recheck CBG. EKG viewed by me is without any ischemic or T wave changes. Findings and plan of care discussed with supervising physician Dr. Tomi Bamberger who  agrees with plan to admit.  Case discussed with nephrologist Dr. Jonnie Finner who agrees patient will need dialysis and recommends medical admission at [facility].  Spoke with Dr. Maudie Mercury with hospitalist service who agrees to assume care of patient and bring into the hospital for further evaluation and management.      Portions of this note  were generated with [employer]. Dictation errors may occur despite best attempts at proofreading.    Cherre Robins, PA-C 05/08/19 1552    Cherre Robins, PA-C 05/08/19 1630    Dorie Rank, MD 05/09/19 1003

## 2019-05-08 NOTE — ED Notes (Signed)
Pt agreed to take glucose tablets once he realized they were chewable
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
## 2019-05-08 NOTE — ED Notes (Signed)
Per Fara Chute RN, Dialysis called and informed us to call [facility] to get patient over there. Kaitlin called [facility].

## 2019-05-08 NOTE — ED Notes (Signed)
Pt agreed for [employer] to do EJ.

## 2019-05-08 NOTE — ED Notes (Signed)
RN made aware of low CBG. Pt encouraged to drink juice. Pt states it makes him nauseous when he drinks fluids. NT observed pt drink 4 oz of apple juice. NT encouraged patient to finish the whole cup.

## 2019-05-08 NOTE — ED Notes (Signed)
Critical result: K: 6.4 PA notified.

## 2019-05-08 NOTE — ED Notes (Signed)
Pt provided water per EDP.

## 2019-05-08 NOTE — ED Notes (Signed)
Attempted to call report to receiving RN and the RN was not available.

## 2019-05-08 NOTE — ED Notes (Signed)
Pt agreed to take glucose tablets and agreed to another attempt of [place] IV "anywhere we can get it" while on the phone with Dr. Maudie Mercury.

## 2019-05-08 NOTE — ED Notes (Addendum)
Expressed need to Pt for him to drink water to hydrate to attempt blood work again. Pt provided apple juice and water as approved by EDP

## 2019-05-08 NOTE — ED Notes (Signed)
Charge RN went into room to attempt [place] IV. Pt demanding IV goes in his hand after already being stuck there multiple times.

## 2019-05-08 NOTE — ED Notes (Signed)
Pt agreeable and cooperative now that he has gotten pain medicine.

## 2019-05-08 NOTE — ED Notes (Signed)
Charge RN at bedside attempting [place] IV.

## 2019-05-08 NOTE — ED Notes (Signed)
Pt placed on 2L Pt requesting 02 and stated wears 2L at home. Pt 02 saturation 100%

## 2019-05-08 NOTE — ED Notes (Addendum)
Patient provided with juice for CBG 57. No IV access at this time.

## 2019-05-08 NOTE — ED Notes (Addendum)
Pt took off monitor and came out to hallway to discuss with this RN about an IV. Pt agreeing to let someone else attempt [place] IV and then he will take a PICC line.

## 2019-05-08 NOTE — ED Notes (Signed)
IV team at bedside. Reports patient is not cooperative with IV attempt. Reports patient will not allow RN to look with [place]. PA made aware.

## 2019-05-08 NOTE — Progress Notes (Signed)
ANTICOAGULATION CONSULT NOTE   Pharmacy Consult for Eliquis Indication: hx pulmonary embolus and DVT  Allergies  Allergen Reactions  . Butalbital-Apap-Caffeine Shortness Of Breath, Swelling and Other (See Comments)    Swelling in throat  . Ferrlecit [Na Ferric Gluc Cplx In Sucrose] Shortness Of Breath, Swelling and Other (See Comments)    Swelling in throat, tolerates Venofor  . Minoxidil Shortness Of Breath  . Tylenol [Acetaminophen] Anaphylaxis and Swelling  . Darvocet [Propoxyphene N-Acetaminophen] Hives    Patient Measurements: Height: _0  (188 cm) Weight: 166 lb 0.1 oz (75.3 kg) IBW/kg (Calculated) : 82.2 Heparin Dosing Weight:   Vital Signs: BP: 153/102 (10/17 1253) Pulse Rate: 70 (10/17 1253)  Labs: Recent Labs    05/06/19 1100 05/08/19 0747  HGB 10.9* 9.6*  HCT 34.3* 30.5*  PLT 132* 150  CREATININE 12.11* 15.10*    Estimated Creatinine Clearance: 5.9 mL/min (A) (by C-G formula based on SCr of 15.1 mg/dL (H)).  Assessment: Patient's a 53 y.o M with ESRD on HD TTSat and DVT/PE on Eliquis 5 mg bid PTA, presented to the ED on 10/16 with c/o n/v.  Abd CT on 10/15 showed no acute findings.  Pharmacy is consulted to resume Eliquis for patient.  Patient informed our med rec tech that the last time he took his meds was 3-4 days ago.  Pt's pharmacy reported that he picked up his Eliquis prescription on 10/2.    Plan:  - resume home dose Eliquis 5 mg bid - dose is appr for HD - pharmacy will sign off. Re-consult [place] if need further assistance  Kaisen Ackers P 05/08/2019,1:50 PM

## 2019-05-08 NOTE — ED Notes (Signed)
Royce RN at beside attempting to convince patient to get [place] IV that is not in his hand.

## 2019-05-08 NOTE — ED Notes (Signed)
PA at bedside.

## 2019-05-08 NOTE — ED Notes (Signed)
Patient called out requesting to speak with doctor. Primary RN Nila Nephew made aware.

## 2019-05-08 NOTE — ED Notes (Signed)
MD at bedside to attempt [place] IV.

## 2019-05-08 NOTE — ED Notes (Signed)
Attempt to recollect CBC. Not enough blood for testing

## 2019-05-08 NOTE — Progress Notes (Signed)
Patient would only let this IV team nurse start an IV in his right hand. I attempted and patient pulled his hand away and said not here. RN made aware.

## 2019-05-08 NOTE — ED Notes (Addendum)
Primary RN requested this writer to obtain IV for pt. Pt would only allow access to bilateral hands and would not allow assessment of R upper arm for IV via [place]. Left arm restricted d/t fistula.  Attempted IV access x4 per pt request. Unable to gain IV access. Primary RN Nila Nephew and PA [facility] notified that pt does not have IV access.

## 2019-05-08 NOTE — ED Notes (Signed)
Pt now refusing to take glucose tablets.

## 2019-05-08 NOTE — ED Notes (Signed)
Dr. Maudie Mercury currently on phone with patient.
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
## 2019-05-08 NOTE — ED Notes (Signed)
Pt ambulated to restroom.

## 2019-05-08 NOTE — ED Notes (Addendum)
Hospitalist made aware that patient does not have IV and is refusing IV.

## 2019-05-08 NOTE — ED Notes (Signed)
Labs resent.
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
## 2019-05-08 NOTE — Progress Notes (Signed)
Called to room and asked to obtain blood sample for nursing  Per verbal order of MD.  Multiple attempts had been made by nursing to obtain blood without success.  Butterfly needle used and blood obtained via right radial artery on first attempt.  Pressure dressing applied as patient states he is on blood thinners.

## 2019-05-08 NOTE — ED Notes (Signed)
Pt called out. When assessing pt need. Pt stated "It fell out" as he was holding the IV catheter in his hand. MD made aware.

## 2019-05-08 NOTE — ED Provider Notes (Signed)
Medical screening examination/treatment/procedure(s) were conducted as a shared visit with non-physician practitioner(s) and myself.  I personally evaluated the patient during the encounter.  Here with his 23rd visit in last 6 months for similar symptoms of pain, nausea, vomiting. Had been seen recently for same, found to have acute on chronic pancreatitis. States he is still symptomatic.  Appears well. VS WNL. Abdomen benign. Symptom control. Plan for labs to ensure no worsening lipemia, otherwise can likely be discharged for same.   Emergency Ultrasound Study:   Angiocath insertion Performed by: Merrily Pew  Consent: Verbal consent obtained. Risks and benefits: risks, benefits and alternatives were discussed Immediately prior to procedure the correct patient, procedure, equipment, support staff and site/side marked as needed.  Indication: difficult IV access Preparation: Patient was prepped and draped in the usual sterile fashion. Vein Location: right hand dorsal vein was visualized during assessment for potential access sites and was found to be patent/ easily compressed with linear ultrasound.  The needle was visualized with real-time ultrasound and guided into the vein. Gauge: 20  Image saved and stored.  Normal blood return.  Patient tolerance: Patient tolerated the procedure well with no immediate complications.    Merrily Pew, MD 05/08/19 2258

## 2019-05-08 NOTE — ED Notes (Signed)
Pt informed that he needs to take [facility] but refused at this time due to Dilaudid at this time. Pt also informed he needs to do a COVID swab and told this RN he "can't do anything right now because of this pain. Come back in 5-10 minutes".

## 2019-05-08 NOTE — ED Notes (Signed)
IV team at bedside 

## 2019-05-09 DIAGNOSIS — R109 Unspecified abdominal pain: Secondary | ICD-10-CM

## 2019-05-09 DIAGNOSIS — R112 Nausea with vomiting, unspecified: Secondary | ICD-10-CM | POA: Diagnosis not present

## 2019-05-09 DIAGNOSIS — F418 Other specified anxiety disorders: Secondary | ICD-10-CM | POA: Diagnosis present

## 2019-05-09 DIAGNOSIS — K859 Acute pancreatitis without necrosis or infection, unspecified: Secondary | ICD-10-CM

## 2019-05-09 DIAGNOSIS — D631 Anemia in chronic kidney disease: Secondary | ICD-10-CM | POA: Diagnosis present

## 2019-05-09 DIAGNOSIS — Z79899 Other long term (current) drug therapy: Secondary | ICD-10-CM | POA: Diagnosis not present

## 2019-05-09 DIAGNOSIS — E1122 Type 2 diabetes mellitus with diabetic chronic kidney disease: Secondary | ICD-10-CM | POA: Diagnosis present

## 2019-05-09 DIAGNOSIS — Z992 Dependence on renal dialysis: Secondary | ICD-10-CM

## 2019-05-09 DIAGNOSIS — I120 Hypertensive chronic kidney disease with stage 5 chronic kidney disease or end stage renal disease: Secondary | ICD-10-CM | POA: Diagnosis not present

## 2019-05-09 DIAGNOSIS — I132 Hypertensive heart and chronic kidney disease with heart failure and with stage 5 chronic kidney disease, or end stage renal disease: Secondary | ICD-10-CM | POA: Diagnosis present

## 2019-05-09 DIAGNOSIS — Z86711 Personal history of pulmonary embolism: Secondary | ICD-10-CM | POA: Diagnosis not present

## 2019-05-09 DIAGNOSIS — I428 Other cardiomyopathies: Secondary | ICD-10-CM | POA: Diagnosis present

## 2019-05-09 DIAGNOSIS — K861 Other chronic pancreatitis: Secondary | ICD-10-CM | POA: Diagnosis not present

## 2019-05-09 DIAGNOSIS — N2581 Secondary hyperparathyroidism of renal origin: Secondary | ICD-10-CM | POA: Diagnosis present

## 2019-05-09 DIAGNOSIS — E1129 Type 2 diabetes mellitus with other diabetic kidney complication: Secondary | ICD-10-CM | POA: Diagnosis not present

## 2019-05-09 DIAGNOSIS — I5042 Chronic combined systolic (congestive) and diastolic (congestive) heart failure: Secondary | ICD-10-CM | POA: Diagnosis present

## 2019-05-09 DIAGNOSIS — I10 Essential (primary) hypertension: Secondary | ICD-10-CM | POA: Diagnosis not present

## 2019-05-09 DIAGNOSIS — E860 Dehydration: Secondary | ICD-10-CM | POA: Diagnosis not present

## 2019-05-09 DIAGNOSIS — E1165 Type 2 diabetes mellitus with hyperglycemia: Secondary | ICD-10-CM | POA: Diagnosis not present

## 2019-05-09 DIAGNOSIS — T8612 Kidney transplant failure: Secondary | ICD-10-CM | POA: Diagnosis present

## 2019-05-09 DIAGNOSIS — Z20828 Contact with and (suspected) exposure to other viral communicable diseases: Secondary | ICD-10-CM | POA: Diagnosis present

## 2019-05-09 DIAGNOSIS — Z91018 Allergy to other foods: Secondary | ICD-10-CM | POA: Diagnosis not present

## 2019-05-09 DIAGNOSIS — K863 Pseudocyst of pancreas: Secondary | ICD-10-CM | POA: Diagnosis present

## 2019-05-09 DIAGNOSIS — K219 Gastro-esophageal reflux disease without esophagitis: Secondary | ICD-10-CM | POA: Diagnosis present

## 2019-05-09 DIAGNOSIS — E785 Hyperlipidemia, unspecified: Secondary | ICD-10-CM | POA: Diagnosis present

## 2019-05-09 DIAGNOSIS — E875 Hyperkalemia: Secondary | ICD-10-CM | POA: Diagnosis not present

## 2019-05-09 DIAGNOSIS — N186 End stage renal disease: Secondary | ICD-10-CM | POA: Diagnosis not present

## 2019-05-09 DIAGNOSIS — K858 Other acute pancreatitis without necrosis or infection: Secondary | ICD-10-CM | POA: Diagnosis not present

## 2019-05-09 DIAGNOSIS — K7460 Unspecified cirrhosis of liver: Secondary | ICD-10-CM | POA: Diagnosis present

## 2019-05-09 DIAGNOSIS — Y830 Surgical operation with transplant of whole organ as the cause of abnormal reaction of the patient, or of later complication, without mention of misadventure at the time of the procedure: Secondary | ICD-10-CM | POA: Diagnosis present

## 2019-05-09 DIAGNOSIS — Z885 Allergy status to narcotic agent status: Secondary | ICD-10-CM | POA: Diagnosis not present

## 2019-05-09 DIAGNOSIS — Z8249 Family history of ischemic heart disease and other diseases of the circulatory system: Secondary | ICD-10-CM | POA: Diagnosis not present

## 2019-05-09 DIAGNOSIS — Z86718 Personal history of other venous thrombosis and embolism: Secondary | ICD-10-CM | POA: Diagnosis not present

## 2019-05-09 HISTORY — DX: Acute pancreatitis without necrosis or infection, unspecified: K85.90

## 2019-05-09 LAB — GLUCOSE, CAPILLARY
Glucose-Capillary: 111 mg/dL — ABNORMAL HIGH (ref 70–99)
Glucose-Capillary: 111 mg/dL — ABNORMAL HIGH (ref 70–99)
Glucose-Capillary: 124 mg/dL — ABNORMAL HIGH (ref 70–99)
Glucose-Capillary: 131 mg/dL — ABNORMAL HIGH (ref 70–99)
Glucose-Capillary: 134 mg/dL — ABNORMAL HIGH (ref 70–99)
Glucose-Capillary: 49 mg/dL — ABNORMAL LOW (ref 70–99)
Glucose-Capillary: 53 mg/dL — ABNORMAL LOW (ref 70–99)
Glucose-Capillary: 66 mg/dL — ABNORMAL LOW (ref 70–99)
Glucose-Capillary: 84 mg/dL (ref 70–99)

## 2019-05-09 MED ORDER — DEXTROSE 50 % IV SOLN
25.0000 g | INTRAVENOUS | Status: AC
  Administered 2019-05-09: 25 g via INTRAVENOUS
  Filled 2019-05-09: qty 50

## 2019-05-09 MED ORDER — NEPRO/CARBSTEADY PO LIQD
237.0000 mL | Freq: Three times a day (TID) | ORAL | Status: DC
  Administered 2019-05-09: 237 mL via ORAL

## 2019-05-09 MED ORDER — HYDROMORPHONE HCL 1 MG/ML IJ SOLN
INTRAMUSCULAR | Status: AC
  Administered 2019-05-09: 01:00:00
  Filled 2019-05-09: qty 0.5

## 2019-05-09 MED ORDER — HYDROMORPHONE HCL 1 MG/ML IJ SOLN
0.5000 mg | INTRAMUSCULAR | Status: DC | PRN
  Administered 2019-05-09 – 2019-05-10 (×6): 0.5 mg via INTRAVENOUS
  Filled 2019-05-09 (×6): qty 1

## 2019-05-09 NOTE — Progress Notes (Signed)
[facility] Progress Note  Subjective: resting today, looks better, denies any new issues  Vitals:   05/09/19 0100 05/09/19 0130 05/09/19 0159 05/09/19 0957  BP: (!) 172/102 (!) 153/94 (!) 156/93 (!) 180/76  Pulse: 100 98 90 79  Resp:    18  Temp:   98.4 F (36.9 C) (!) 97.5 F (36.4 C)  TempSrc:   Oral Oral  SpO2:   94% 96%  Weight:   73.2 kg   Height:        Inpatient medications: . amLODipine  5 mg Oral Daily  . apixaban  5 mg Oral BID  . Chlorhexidine Gluconate Cloth  6 each Topical Q0600  . feeding supplement (NEPRO CARB STEADY)  237 mL Oral TID WC  . hydrALAZINE  100 mg Oral TID  . insulin aspart  0-9 Units Subcutaneous Q4H  . metoprolol tartrate  5 mg Intravenous Q6H  . oxyCODONE  5 mg Oral Once  . pantoprazole  40 mg Oral Daily  . senna-docusate  2 tablet Oral QHS  . sodium chloride flush  3 mL Intravenous Q12H   . sodium chloride    . sodium chloride     sodium chloride, cyclobenzaprine, diphenhydrAMINE, hydrALAZINE, HYDROmorphone (DILAUDID) injection, ondansetron (ZOFRAN) IV, promethazine, sodium chloride flush    Exam: Gen alert, calm , no distress No jvd or bruits Chest clear bilat to bases RRR no MRG Abd firm, diffuse mild tenderness, no rebound, +bs MS no joint effusions or deformity Ext no LE edema Neuro is alert, Ox 3 , nf LUA AVF +bruit    Home meds:  - amlodipine 5 qd/ hydralazine 100 tid/ lisinopril 5 qd  - omeprazole 40 qd  - apixaban 5 bid  - sl ntg prn  - prn's/ vitamins/ supplements   Outpt HD: TTS [place]  4h  400/800   72.5kg    2/2.25 bath   AVF  No heparin  - parsabiv 5 mg tiw  - mircera 200 ug every 2 wks, last ?  - sod thiosulfate 25 gm tiw IV    Assessment/ Plan: 1. Acute / chronic abd pain/ ^lipase - recurrent / chronic pancreatitis. Admitted for pain meds, NPO.  Perprimary.  2. ESRD - on HD TTS.  Had HD last night. Cont TTS sched.  3. Hyperkalemia - asymptomatic, will improve /w HD tonight. Give  Lokelma x 1.  4. HTN/ vol - taking po BP meds today, BP's still a bit high. Cont IV metoprolol 5 q 6 for now. 1kg over dry 5. H/o DVT/ PE -  eliquis      Rob Yulonda [place] 05/09/2019, 1:24 PM  Iron/TIBC/Ferritin/ %Sat    Component Value Date/Time   IRON 34 (L) 04/26/2018 1043   TIBC 154 (L) 04/26/2018 1043   FERRITIN 301 04/26/2018 1043   IRONPCTSAT 22 04/26/2018 1043   Recent Labs  Lab 05/08/19 0747 05/08/19 2216  NA 140  --   K 6.4* 5.9*  CL 98  --   CO2 22  --   GLUCOSE 66*  --   BUN 85*  --   CREATININE 15.10*  --   CALCIUM 8.1*  --   ALBUMIN 3.4*  --    Recent Labs  Lab 05/08/19 0747  AST 12*  ALT 9  ALKPHOS 168*  BILITOT 0.8  PROT 7.5   Recent Labs  Lab 05/08/19 0747  WBC 4.6  HGB 9.6*  HCT 30.5*  PLT 150

## 2019-05-09 NOTE — Progress Notes (Addendum)
PROGRESS NOTE    Frank [name]  WHQ:[id number] DOB: September 16, 1965 DOA: 05/07/2019 PCP: Patient, [name]   Brief Narrative:  Frank [name]  is a 53 y.o. male, w h/o DVT/ PE,  hypertension, hyperlipidemia, dm2, ESRD on HD (TTS), CHF (diastolic), h/o cirrhosis, chronic pancreatitis w pseudocyst presents with c/o acute on chronic abdominal pain.  He missed his dialysis on Thursday and was only was able to complete 1.5 hours of dialysis on Saturday due to his abdominal pain.   In ED,  CT abd/ pelvis 05/06/2019 IMPRESSION: No acute findings within the abdomen or pelvis.  Stable appearance of failed, partially calcified renal transplant in left iliac fossa.  Severe atrophy of native kidneys, consistent with end-stage renal disease. No evidence of hydronephrosis.  Aortic Atherosclerosis (ICD10-I70.0).  Pt will be admitted for acute on chronic abdominal pain likely secondary to acute pancreatitis as well as hyperkalemia, and need for dialysis.    Assessment & Plan:   Principal Problem:   Hyperkalemia Active Problems:   DM (diabetes mellitus), type 2, uncontrolled, with renal complications (HCC)   Essential hypertension   Recurrent abdominal pain   End-stage renal disease on hemodialysis (HCC)   Chronic pancreatitis (HCC)   Acute on chronic abdominal pain  Secondary to chronic pancreatitis Cont NPO secondary to continued pain Decreased dilaudid to 66m iv q4h prn  zofran 461miv q6h prn   Hyperkalemia Lisinopril discontinued on admission calclium gluconate 1gm iv x1 on admission Sodium bicarbonate 1 amp iv x1 on admission [place] Nephrology consulted appreciated-further recs per neph  ESRD on HD TTS Nephrology consulted by ED, appreciate input  Hypertension STOPPED Lisinopril as above Cont Hydralazine 10035mo tid Cont Amlodipine 5mg80m qday Hydralazine 10mg24mq6h prn   Dm2 fsbs q4h, ISS  Gerd Cont PPI  Chronic nausea zofran as above, if  not effective phenergan 6.25mg 31m6h prn   H/o PE Cont Eliquis pharmacy to dose  DVT prophylaxis: On:EliKZ:LDJTTSV Status: Full    Code Status Orders  (From admission, onward)         Start     Ordered   05/08/19 1148  Full code  Continuous     05/08/19 1155        Code Status History    Date Active Date Inactive Code Status Order ID Comments User Context   04/19/2019 0837 04/21/2019 1507 Full Code [id number],Lavina HammanD   04/12/2019 0723 04/14/2019 1608 Full Code [id number] XIvor CostaD [place]8/20/2020 0610 03/12/2019 1728 Full Code [id number],Reubin MilanD   07/26/2018 0355 08/05/2018 1350 Full Code [id number] Osmondnpatient   07/25/2018 1747 07/26/2018 0355 Full Code [id number] OsmondD   06/22/2018 1627 06/24/2018 2025 Full Code [id number],Karmen BongoD   06/02/2018 2156 06/04/2018 1252 Full Code [id number] QuillD   05/09/2018 1935 05/15/2018 1818 Full Code [id number] XIvor CostaD   04/25/2018 1652 04/27/2018 1618 Full Code [id number], Cristal Deernpatient   02/14/2018 1501 02/18/2018 1352 Full Code [id number] DeutscherD   01/31/2018 1609 02/06/2018 0349 Full Code [id number] DeutscherD   01/16/2018 0501 01/19/2018 2127 Full Code [id number] XIvor CostaD   11/09/2017 1027 11/11/2017 1706 Full Code [id number] PartyD   09/21/2017  1320 09/25/2017 0301 Full Code [id number]  Rogue Bussing, MD Inpatient   08/24/2017 1108 09/04/2017 2358 Full Code [id number]  Sela Hua, MD ED   08/09/2017 0343 08/12/2017 1923 Full Code [id number]  Rory Percy, DO ED   07/21/2017 2129 07/24/2017 1315 Full Code [id number]  [place] Bing, DO Inpatient   07/03/2017 1457 07/04/2017 1435 Full Code [id number]  Mercy Riding, MD ED   06/25/2017 0618 06/27/2017 1808 Full Code [id number]  Mercy Riding, MD Inpatient   05/04/2017 1627 05/06/2017 1644 Full Code  [id number]  [name], MD Inpatient   04/27/2017 2201 04/28/2017 1736 DNR [id number]  Elwin Mocha, MD ED   04/21/2017 0118 04/22/2017 1426 Full Code [id number]  Jani Gravel, MD ED   03/13/2017 0306 03/15/2017 1230 Full Code [id number]  [name], DO ED   02/24/2017 0239 02/25/2017 1049 Full Code [id number]  Karmen Bongo, MD Inpatient   01/17/2017 0620 01/17/2017 1023 Full Code [id number]  Maryellen Pile, MD Inpatient   01/12/2017 0508 01/14/2017 1729 Full Code [id number]  Shela Leff, MD ED   01/05/2017 0249 01/07/2017 1825 Full Code [id number]  Holley Raring, MD ED   11/29/2016 1726 12/01/2016 1810 Full Code [id number]  Milagros Loll, MD Inpatient   10/13/2016 2049 10/15/2016 1300 Full Code [id number]  Reubin Milan, MD Inpatient   10/05/2016 1735 10/08/2016 1802 Full Code [id number]  Verlee Monte, MD Inpatient   08/04/2016 0750 08/08/2016 1800 Full Code [id number]  Rondel Jumbo, PA-C ED   07/14/2016 2319 07/16/2016 1813 Full Code [id number]  Vianne Bulls, MD ED   07/11/2016 0043 07/11/2016 1755 Full Code [id number]  Orson Eva, MD Inpatient   06/24/2016 2159 06/26/2016 1628 Full Code [id number]  Vianne Bulls, MD ED   06/22/2016 2058 06/24/2016 1632 Full Code [id number]  Vianne Bulls, MD ED   06/17/2016 0809 06/18/2016 1809 Full Code [id number]  [name], MD ED   05/31/2016 0118 05/31/2016 1724 Full Code [id number]  [name], MD Inpatient   05/29/2016 0239 05/30/2016 1743 Full Code [id number]  Norval Morton, MD Inpatient   05/26/2016 1204 05/27/2016 1449 Full Code [id number]  Roney Jaffe, MD Inpatient   05/20/2016 0141 05/21/2016 1839 Full Code [id number]  [name], MD ED   05/13/2016 2332 05/15/2016 1418 Full Code [id number]  [name], MD ED   04/01/2016 0034 04/01/2016 1818 Full Code [id number]  [name], DO ED   01/07/2016 2016 01/10/2016 1202 Full Code [id number]  Rogue Bussing, MD Inpatient   10/30/2015 0936  11/04/2015 1710 Full Code [id number]  [name], NP Inpatient   10/16/2015 2206 10/17/2015 1756 Full Code [id number]  [name], MD Inpatient   10/06/2015 0250 10/06/2015 1517 Full Code [id number]  Norval Morton, MD ED   09/23/2015 2317 09/27/2015 1704 Full Code [id number]  Norval Morton, MD Inpatient   09/08/2015 0407 09/11/2015 1740 Full Code [id number]  [name], MD Inpatient   08/18/2015 1806 08/20/2015 2049 Full Code [id number]  [name], MD Inpatient   06/19/2015 1204 06/19/2015 2049 Full Code [id number]  [name], MD Inpatient   02/11/2015 0209 02/16/2015 2207 Full Code [id number]  [name], MD ED   01/31/2015 2112 02/04/2015 1650 Full Code [id number]  [name], MD Inpatient   01/02/2015 1629 01/03/2015 1733 Full Code [id number]  [name], MD Inpatient   11/29/2014 0319 12/05/2014 1730 Full Code [id number]  Berle Mull  M, MD ED   11/17/2014 1006 11/19/2014 1217 Full Code [id number]  [name], MD ED   10/07/2014 1629 10/10/2014 1748 Full Code [id number]  [name], MD Inpatient   09/30/2014 2342 10/02/2014 1914 Full Code [id number]  [name], DO ED   09/02/2014 1516 09/02/2014 2215 Full Code [id number]  [name], MD Inpatient   08/30/2014 1829 09/02/2014 1516 Full Code [id number]  [name], NP ED   05/08/2014 0904 05/13/2014 1750 Full Code [id number]  [name], MD ED   04/05/2014 1916 04/06/2014 1504 Full Code [id number]  [name], MD Inpatient   03/27/2014 0943 03/27/2014 2138 Full Code [id number]  [name], MD Inpatient   01/19/2014 0535 01/19/2014 2019 Full Code [id number]  [name], MD Inpatient   12/12/2013 2007 12/13/2013 1942 Full Code [id number]  [name], MD Inpatient   10/17/2013 1615 10/18/2013 1639 Full Code [id number]  [name], MD Inpatient   09/16/2013 2055 09/20/2013 1920 Full Code [id number]  [name], NP Inpatient   09/12/2013 0248 09/12/2013 2256 Full Code  [id number]  [name], MD Inpatient   06/24/2013 1327 06/24/2013 1916 Full Code [id number]  [name], MD Inpatient   Advance Care Planning Activity     Family Communication: Discussed in detail with patient Disposition Plan: Patient remained in the hospital and was changed to inpatient requiring continued electrolyte monitoring, multiple IV pain medications for acute on chronic pancreatitis, continued subspecialty evaluation and management of end-stage renal disease.  Patient not medically stable for discharge Consults called: None Admission status: Observation   Consultants:   None  Procedures:  Ct Abdomen Pelvis Wo Contrast  Result Date: 05/06/2019 CLINICAL DATA:  Increased abdominal distention. End-stage renal disease. Failed kidney transplant. Chronic pancreatitis. EXAM: CT ABDOMEN AND PELVIS WITHOUT CONTRAST TECHNIQUE: Multidetector CT imaging of the abdomen and pelvis was performed following the standard protocol without IV contrast. COMPARISON:  04/20/2019 FINDINGS: Lower chest: No acute findings. Stable right basilar pleural-parenchymal scarring with associated rounded atelectasis. Stable cardiomegaly and small pericardial effusion. Hepatobiliary: No mass visualized on this unenhanced exam. Gallbladder is unremarkable. No evidence of biliary ductal dilatation. Pancreas: No mass or inflammatory process visualized on this unenhanced exam. Spleen:  Within normal limits in size. Adrenals/Urinary tract: Normal adrenal glands. Diffuse atrophy of native kidneys again seen, consistent with end-stage renal disease. 3 cm high attenuation left renal lesion is stable, consistent with hemorrhagic cyst. Failed renal transplant with calcification is again seen in the left iliac fossa. Stomach/Bowel: No evidence of obstruction, inflammatory process, or abnormal fluid collections. Vascular/Lymphatic: No pathologically enlarged lymph nodes identified. No evidence of abdominal aortic aneurysm. Aortic  atherosclerosis. Reproductive:  No mass or other significant abnormality. Other:  None. Musculoskeletal:  No suspicious bone lesions identified. IMPRESSION: No acute findings within the abdomen or pelvis. Stable appearance of failed, partially calcified renal transplant in left iliac fossa. Severe atrophy of native kidneys, consistent with end-stage renal disease. No evidence of hydronephrosis. Aortic Atherosclerosis (ICD10-I70.0). Electronically Signed   By: Marlaine Hind M.D.   On: 05/06/2019 13:05   Ct Abdomen Pelvis Wo Contrast  Result Date: 04/20/2019 CLINICAL DATA:  53 year old male with acute abdominal pain, nausea and vomiting. Patient with end-stage renal disease on dialysis. EXAM: CT ABDOMEN AND PELVIS WITHOUT CONTRAST TECHNIQUE: Multidetector CT imaging of the abdomen and pelvis was performed following the standard protocol without IV contrast. COMPARISON:  06/22/2018 CT FINDINGS: Please note that parenchymal abnormalities may be  missed without intravenous contrast. Lower chest: Moderate to marked cardiomegaly and small to moderate pericardial effusion again noted. Heavy coronary artery and aortic atherosclerotic calcifications noted. Small loculated RIGHT pleural effusion with moderate bibasilar atelectasis noted. Hepatobiliary: The liver and gallbladder are unremarkable. No biliary dilatation. Pancreas: Unremarkable Spleen: Unremarkable Adrenals/Urinary Tract: Atrophic kidneys again noted. A 2.7 x 3 cm slightly hyperdense mass within the LEFT kidney is unchanged from the prior study and decreased in size since 2018. A dystrophic failed LEFT pelvic renal transplant again noted. The adrenal glands and visualized bladder are unremarkable. Stomach/Bowel: Stomach is within normal limits. No evidence of bowel wall thickening, distention, or inflammatory changes. Vascular/Lymphatic: Heavy aortic atherosclerosis and other vascular calcifications identified. No enlarged abdominal or pelvic lymph nodes.  Reproductive: Prostate is unremarkable. Other: No ascites, focal collection or pneumoperitoneum. Musculoskeletal: No acute or suspicious bony abnormalities identified. IMPRESSION: 1. No evidence of acute abnormality. 2. Moderate to marked cardiomegaly and unchanged small to moderate pericardial effusion 3. Atrophic kidneys with unchanged LEFT renal lesion. 4. Small loculated RIGHT pleural effusion and moderate bibasilar atelectasis. 5. Coronary artery and Aortic Atherosclerosis (ICD10-I70.0). Electronically Signed   By: Margarette Canada M.D.   On: 04/20/2019 19:03   Dg Chest 2 View  Result Date: 04/17/2019 CLINICAL DATA:  Chest pain. EXAM: CHEST - 2 VIEW COMPARISON:  Most recent comparison 5 days ago 04/12/2019 FINDINGS: Chronic cardiomegaly, unchanged. Unchanged mediastinal contours. Chronic partially loculated right pleural effusion with associated basilar opacity. Vascular congestion and suggestion of mild pulmonary edema, also unchanged. Trace left pleural effusion. No new airspace disease or pneumothorax. Unchanged osseous structures. IMPRESSION: 1. Unchanged radiographic appearance of the chest over the past 5 days. 2. Chronic cardiomegaly with pulmonary edema. Bilateral pleural effusions, partially loculated on the right with associated basilar opacity. Electronically Signed   By: Keith Rake M.D.   On: 04/17/2019 00:35   Dg Chest Portable 1 View  Result Date: 05/06/2019 CLINICAL DATA:  Abdominal pain and vomiting EXAM: PORTABLE CHEST 1 VIEW COMPARISON:  04/17/2019, CT 04/20/2019 FINDINGS: Enlarged cardiac silhouette. Loculated pleural fluid andatelectasis seen at the RIGHT lung base as described on CT 04/20/2019. Mild central venous pulmonary congestion. No focal consolidation. No pneumothorax. IMPRESSION: 1. No interval change. 2. Marked cardiomegaly. 3. RIGHT pleural effusion with basilar loculation and atelectasis. Electronically Signed   By: Suzy Bouchard M.D.   On: 05/06/2019 09:04   Dg  Chest Port 1 View  Result Date: 04/12/2019 CLINICAL DATA:  Shortness of breath EXAM: PORTABLE CHEST 1 VIEW COMPARISON:  03/23/2019, 03/19/2019, CT chest 08/28/2018 FINDINGS: Moderate cardiomegaly with central vascular congestion and mild interstitial pulmonary edema. Moderate right pleural effusion with loculated appearance at the right base. Similar airspace disease at the right base. Convex contour. No pneumothorax. IMPRESSION: 1. Cardiomegaly with vascular congestion and mild diffuse interstitial pulmonary edema 2. No significant change in loculated appearing moderate right pleural effusion with persistent airspace disease and possible mass at the right lung base. Electronically Signed   By: Donavan Foil M.D.   On: 04/12/2019 01:07   Dg Abdomen Acute W/chest  Result Date: 04/19/2019 CLINICAL DATA:  Abdominal pain and vomiting. Symptoms for 3 days. EXAM: DG ABDOMEN ACUTE W/ 1V CHEST COMPARISON:  Radiograph 11/15/2018. CT 06/22/2018 FINDINGS: Chronic cardiomegaly. Unchanged mediastinal contours. Chronic loculated pleuroparenchymal opacity at the right lung base and laterally, decreased pleural effusion component from prior exam. Pulmonary edema with mild progression. Small left pleural effusion. No free intra-abdominal air. Few prominent air-filled small bowel loops in  the left abdomen without obstruction. Vascular calcifications and partially calcified renal transplant in the left lower quadrant. No acute osseous abnormalities are seen. IMPRESSION: 1. Few prominent air-filled small bowel loops in the left abdomen without evidence obstruction. No free air. 2. Chronic loculated pleuroparenchymal opacity at the right lung base, decreased pleural fluid from prior exam. 3. Chronic cardiomegaly.  Pulmonary edema has mild progressed. Electronically Signed   By: Keith Rake M.D.   On: 04/19/2019 03:07     Antimicrobials:   None   Subjective: Patient been non-compliant with staff Refusing to wear  telemetry Also cooperative on my evaluation this morning, did report abdominal pain  Objective: Vitals:   05/09/19 0100 05/09/19 0130 05/09/19 0159 05/09/19 0957  BP: (!) 172/102 (!) 153/94 (!) 156/93 (!) 180/76  Pulse: 100 98 90 79  Resp:    18  Temp:   98.4 F (36.9 C) (!) 97.5 F (36.4 C)  TempSrc:   Oral Oral  SpO2:   94% 96%  Weight:   73.2 kg   Height:        Intake/Output Summary (Last 24 hours) at 05/09/2019 1114 Last data filed at 05/09/2019 0600 Gross per 24 hour  Intake 0 ml  Output 3000 ml  Net -3000 ml   Filed Weights   05/07/19 2354 05/08/19 2159 05/09/19 0159  Weight: 75.3 kg 76.2 kg 73.2 kg    Examination:  General exam: Appears calm and comfortable  Respiratory system: Clear to auscultation. Respiratory effort normal. Cardiovascular system: S1 & S2 heard, RRR. No JVD, murmurs, rubs, gallops or clicks. No pedal edema. Gastrointestinal system: Abdomen is mildly tender to palpation epigastric region. No organomegaly or masses felt although limited exam secondary to pain per patient. Normal bowel sounds heard. Central nervous system: Alert and oriented. No focal neurological deficits. Extremities: Does move all 4 extremities freely, no focal neurological deficit noted Skin: No rashes, lesions or ulcers Psychiatry: Judgement and insight appear normal. Mood & affect appropriate with me but reported as labile.     Data Reviewed: I have personally reviewed following labs and imaging studies  CBC: Recent Labs  Lab 05/06/19 1100 05/08/19 0747  WBC 4.5 4.6  NEUTROABS 3.0 3.0  HGB 10.9* 9.6*  HCT 34.3* 30.5*  MCV 90.5 89.7  PLT 132* 449   Basic Metabolic Panel: Recent Labs  Lab 05/06/19 1100 05/08/19 0747 05/08/19 2216  NA 140 140  --   K 5.1 6.4* 5.9*  CL 97* 98  --   CO2 24 22  --   GLUCOSE 71 66*  --   BUN 56* 85*  --   CREATININE 12.11* 15.10*  --   CALCIUM 8.2* 8.1*  --    GFR: Estimated Creatinine Clearance: 5.7 mL/min (A) (by C-G  formula based on SCr of 15.1 mg/dL (H)). Liver Function Tests: Recent Labs  Lab 05/06/19 1100 05/08/19 0747  AST 14* 12*  ALT 11 9  ALKPHOS 186* 168*  BILITOT 0.7 0.8  PROT 8.0 7.5  ALBUMIN 3.3* 3.4*   Recent Labs  Lab 05/06/19 1100 05/08/19 0010  LIPASE 217* 154*   No results for input(s): AMMONIA in the last 168 hours. Coagulation Profile: No results for input(s): INR, PROTIME in the last 168 hours. Cardiac Enzymes: No results for input(s): CKTOTAL, CKMB, CKMBINDEX, TROPONINI in the last 168 hours. BNP (last 3 results) No results for input(s): PROBNP in the last 8760 hours. HbA1C: No results for input(s): HGBA1C in the last 72 hours. CBG: Recent  Labs  Lab 05/08/19 2116 05/09/19 0243 05/09/19 0316 05/09/19 0712 05/09/19 0808  GLUCAP 65* 49* 131* 53* 124*   Lipid Profile: No results for input(s): CHOL, HDL, LDLCALC, TRIG, CHOLHDL, LDLDIRECT in the last 72 hours. Thyroid Function Tests: No results for input(s): TSH, T4TOTAL, FREET4, T3FREE, THYROIDAB in the last 72 hours. Anemia Panel: No results for input(s): VITAMINB12, FOLATE, FERRITIN, TIBC, IRON, RETICCTPCT in the last 72 hours. Sepsis Labs: No results for input(s): PROCALCITON, LATICACIDVEN in the last 168 hours.  Recent Results (from the past 240 hour(s))  SARS Coronavirus 2 by RT PCR (hospital order, performed in [facility] hospital lab) Nasopharyngeal Nasopharyngeal Swab     Status: None   Collection Time: 05/08/19 11:52 AM   Specimen: Nasopharyngeal Swab  Result Value Ref Range Status   SARS Coronavirus 2 NEGATIVE NEGATIVE Final    Comment: (NOTE) If result is NEGATIVE SARS-CoV-2 target nucleic acids are NOT DETECTED. The SARS-CoV-2 RNA is generally detectable in upper and lower  respiratory specimens during the acute phase of infection. The lowest  concentration of SARS-CoV-2 viral copies this assay can detect is 250  copies / mL. A negative result does not preclude SARS-CoV-2 infection  and  should not be used as the sole basis for treatment or other  patient management decisions.  A negative result may occur with  improper specimen collection / handling, submission of specimen other  than nasopharyngeal swab, presence of viral mutation(s) within the  areas targeted by this assay, and inadequate number of viral copies  (<250 copies / mL). A negative result must be combined with clinical  observations, patient history, and epidemiological information. If result is POSITIVE SARS-CoV-2 target nucleic acids are DETECTED. The SARS-CoV-2 RNA is generally detectable in upper and lower  respiratory specimens dur ing the acute phase of infection.  Positive  results are indicative of active infection with SARS-CoV-2.  Clinical  correlation with patient history and other diagnostic information is  necessary to determine patient infection status.  Positive results do  not rule out bacterial infection or co-infection with other viruses. If result is PRESUMPTIVE POSTIVE SARS-CoV-2 nucleic acids MAY BE PRESENT.   A presumptive positive result was obtained on the submitted specimen  and confirmed on repeat testing.  While 2019 novel coronavirus  (SARS-CoV-2) nucleic acids may be present in the submitted sample  additional confirmatory testing may be necessary for epidemiological  and / or clinical management purposes  to differentiate between  SARS-CoV-2 and other Sarbecovirus currently known to infect humans.  If clinically indicated additional testing with an alternate test  methodology [phone]) is advised. The SARS-CoV-2 RNA is generally  detectable in upper and lower respiratory sp ecimens during the acute  phase of infection. The expected result is Negative. Fact Sheet for Patients:  StrictlyIdeas.no Fact Sheet for Healthcare Providers: BankingDealers.co.za This test is not yet approved or cleared by the [place] FDA and has been  authorized for detection and/or diagnosis of SARS-CoV-2 by FDA under an Emergency Use Authorization (EUA).  This EUA will remain in effect (meaning this test can be used) for the duration of the COVID-19 declaration under Section 564(b)(1) of the Act, 21 U.S.C. section 360bbb-3(b)(1), unless the authorization is terminated or revoked sooner. Performed at [facility], [place] [address] [place], [place] [address]          Radiology Studies: No results found.      Scheduled Meds:  amLODipine  5 mg Oral Daily   apixaban  5 mg Oral BID   Chlorhexidine Gluconate Cloth  6 each Topical Q0600   hydrALAZINE  100 mg Oral TID   insulin aspart  0-9 Units Subcutaneous Q4H   metoprolol tartrate  5 mg Intravenous Q6H   oxyCODONE  5 mg Oral Once   pantoprazole  40 mg Oral Daily   senna-docusate  2 tablet Oral QHS   sodium chloride flush  3 mL Intravenous Q12H   Continuous Infusions:  sodium chloride     sodium chloride       LOS: 0 days    Time spent: 35 min    Nicolette Bang, MD [facility]  If 7PM-7AM, please contact night-coverage  05/09/2019, 11:14 AM

## 2019-05-09 NOTE — Progress Notes (Signed)
Pt refusing telemetry at this time

## 2019-05-10 DIAGNOSIS — R112 Nausea with vomiting, unspecified: Secondary | ICD-10-CM

## 2019-05-10 DIAGNOSIS — K861 Other chronic pancreatitis: Secondary | ICD-10-CM

## 2019-05-10 DIAGNOSIS — E860 Dehydration: Secondary | ICD-10-CM

## 2019-05-10 LAB — GLUCOSE, CAPILLARY
Glucose-Capillary: 75 mg/dL (ref 70–99)
Glucose-Capillary: 76 mg/dL (ref 70–99)
Glucose-Capillary: 124 mg/dL — ABNORMAL HIGH (ref 70–99)
Glucose-Capillary: 118 mg/dL — ABNORMAL HIGH (ref 70–99)

## 2019-05-10 MED ORDER — HYDROMORPHONE HCL 1 MG/ML IJ SOLN
0.5000 mg | Freq: Four times a day (QID) | INTRAMUSCULAR | Status: DC | PRN
  Administered 2019-05-10 (×2): 0.5 mg via INTRAVENOUS
  Filled 2019-05-10 (×2): qty 1

## 2019-05-10 MED ORDER — HYDROMORPHONE HCL 1 MG/ML IJ SOLN
0.5000 mg | Freq: Once | INTRAMUSCULAR | Status: AC
  Administered 2019-05-10: 1 mg via INTRAVENOUS

## 2019-05-10 MED ORDER — CHLORHEXIDINE GLUCONATE CLOTH 2 % EX PADS: 6.0000 | MEDICATED_PAD | Freq: Every day | CUTANEOUS | Status: DC

## 2019-05-10 MED ORDER — HYDROMORPHONE HCL 1 MG/ML IJ SOLN
1.0000 mg | Freq: Four times a day (QID) | INTRAMUSCULAR | Status: DC | PRN
  Administered 2019-05-11 – 2019-05-12 (×6): 1 mg via INTRAVENOUS
  Filled 2019-05-10 (×6): qty 1

## 2019-05-10 MED ORDER — LABETALOL HCL 100 MG PO TABS
100.0000 mg | ORAL_TABLET | Freq: Two times a day (BID) | ORAL | Status: DC
  Administered 2019-05-10 – 2019-05-12 (×5): 100 mg via ORAL
  Filled 2019-05-10 (×5): qty 1

## 2019-05-10 MED ORDER — AMLODIPINE BESYLATE 10 MG PO TABS
10.0000 mg | ORAL_TABLET | Freq: Every day | ORAL | Status: DC
  Administered 2019-05-11 – 2019-05-12 (×2): 10 mg via ORAL
  Filled 2019-05-10 (×2): qty 1

## 2019-05-10 MED ORDER — SODIUM THIOSULFATE 25 % IV SOLN
25.0000 g | INTRAVENOUS | Status: DC
  Administered 2019-05-11: 25 g via INTRAVENOUS
  Filled 2019-05-10: qty 100

## 2019-05-10 MED ORDER — HYDROMORPHONE HCL 1 MG/ML IJ SOLN
INTRAMUSCULAR | Status: AC
  Filled 2019-05-10: qty 1

## 2019-05-10 NOTE — Progress Notes (Signed)
PROGRESS NOTE    HALLIS MEDITZ  NOI:[id number] DOB: June 30, 1966 DOA: 05/07/2019 PCP: Patient, [name]   Brief Narrative:  Irene Mitcham  is a 53 y.o. male, w h/o DVT/ PE,  hypertension, hyperlipidemia, dm2, ESRD on HD (TTS), CHF (diastolic), h/o cirrhosis, chronic pancreatitis w pseudocyst presents with c/o acute on chronic abdominal pain.  He missed his dialysis on Thursday and was only was able to complete 1.5 hours of dialysis on Saturday due to his abdominal pain.   In ED,  CT abd/ pelvis 05/06/2019 IMPRESSION: No acute findings within the abdomen or pelvis.  Stable appearance of failed, partially calcified renal transplant in left iliac fossa.  Severe atrophy of native kidneys, consistent with end-stage renal disease. No evidence of hydronephrosis.  Aortic Atherosclerosis (ICD10-I70.0).  Pt will be admitted for acute on chronic abdominal pain likely secondary to acute pancreatitis as well as hyperkalemia, and need for dialysis.    Assessment & Plan:   Principal Problem:   Hyperkalemia Active Problems:   DM (diabetes mellitus), type 2, uncontrolled, with renal complications (HCC)   Essential hypertension   Recurrent abdominal pain   End-stage renal disease on hemodialysis (HCC)   Chronic pancreatitis (HCC)   Pancreatitis, acute   Acute on chronic abdominal pain  -Secondary to acute on chronic pancreatitis -continue PRN antiemetics and analgesics -slowly advance diet -follow clinical response -no fever -slowly improving.   Hyperkalemia -Lisinopril discontinued on admission -follow nephrology rec's -continue HD -Lokelma and calcium gluconate given  ESRD on HD TTS -Nephrology on board; will follow recommendations and appreciate assistance with further dialysis treatment.  Hypertension -Lisinopril has been discontinued -Cont Hydralazine 157m po tid -Cont Amlodipine 561mpo qday -Blood pressure has been discontinued patient started on labetalol twice a  day. -Continue to follow vital signs and make any further adjustments to his antihypertensive regimen if needed.  Dm2 with nephropathy -Continue sliding scale insulin  Gerd -Cont PPI  Chronic nausea -Will continue zofran as needed and if not effective will use Phenergan.  -Acute exacerbation in the setting of acute on chronic pancreatitis.  H/o PE -Cont Eliquis pharmacy to dose -Denies chest pain or shortness of breath.  DVT prophylaxis: OnQX:IHWTUUECode Status: Full    Code Status Orders  (From admission, onward)         Start     Ordered   05/08/19 1148  Full code  Continuous     05/08/19 1155        Code Status History    Date Active Date Inactive Code Status Order ID Comments User Context   04/19/2019 0837 04/21/2019 1507 Full Code [id number] HammanMD ED   04/12/2019 0723 04/14/2019 1608 Full Code [id number] CostaMD ED   03/11/2019 0610 03/12/2019 1728 Full Code [id number] MilanMD ED   07/26/2018 0355 08/05/2018 1350 Full Code [id number] OsmondDO Inpatient   07/25/2018 1747 07/26/2018 0355 Full Code [id number] OsmondDO ED   06/22/2018 1627 06/24/2018 2025 Full Code [id number] BongoMD ED   06/02/2018 2156 06/04/2018 1252 Full Code [id number] QuillDO ED   05/09/2018 1935 05/15/2018 1818 Full Code [id number] CostaMD ED   04/25/2018 1652 04/27/2018 1618 Full Code [id number] DeerMD Inpatient   02/14/2018 1501 02/18/2018 1352 Full Code [id number] DeutscherMD ED   01/31/2018 1609 02/06/2018 0349 Full Code [id number]  Lady Deutscher, MD ED   01/16/2018 0501 01/19/2018 2127 Full Code [id number]  Ivor Costa, MD ED   11/09/2017 1027 11/11/2017 1706 Full Code [id number]  Valinda Party, DO ED   09/21/2017 1320 09/25/2017 0301 Full Code [id number]  Rogue Bussing, MD Inpatient   08/24/2017 1108 09/04/2017 2358 Full Code [id number]  Mayo, Pete Pelt, MD ED   08/09/2017  0343 08/12/2017 1923 Full Code [id number]  Rory Percy, DO ED   07/21/2017 2129 07/24/2017 1315 Full Code [id number]  Williston Bing, DO Inpatient   07/03/2017 1457 07/04/2017 1435 Full Code [id number]  Mercy Riding, MD ED   06/25/2017 0618 06/27/2017 1808 Full Code [id number]  Mercy Riding, MD Inpatient   05/04/2017 1627 05/06/2017 1644 Full Code [id number]  [name], MD Inpatient   04/27/2017 2201 04/28/2017 1736 DNR [id number]  Elwin Mocha, MD ED   04/21/2017 0118 04/22/2017 1426 Full Code [id number]  Jani Gravel, MD ED   03/13/2017 0306 03/15/2017 1230 Full Code [id number]  [name], DO ED   02/24/2017 0239 02/25/2017 1049 Full Code [id number]  Karmen Bongo, MD Inpatient   01/17/2017 0620 01/17/2017 1023 Full Code [id number]  Maryellen Pile, MD Inpatient   01/12/2017 0508 01/14/2017 1729 Full Code [id number]  Shela Leff, MD ED   01/05/2017 0249 01/07/2017 1825 Full Code [id number]  Holley Raring, MD ED   11/29/2016 1726 12/01/2016 1810 Full Code [id number]  Milagros Loll, MD Inpatient   10/13/2016 2049 10/15/2016 1300 Full Code [id number]  Reubin Milan, MD Inpatient   10/05/2016 1735 10/08/2016 1802 Full Code [id number]  Verlee Monte, MD Inpatient   08/04/2016 0750 08/08/2016 1800 Full Code [id number]  Rondel Jumbo, PA-C ED   07/14/2016 2319 07/16/2016 1813 Full Code [id number]  Vianne Bulls, MD ED   07/11/2016 0043 07/11/2016 1755 Full Code [id number]  Orson Eva, MD Inpatient   06/24/2016 2159 06/26/2016 1628 Full Code [id number]  Vianne Bulls, MD ED   06/22/2016 2058 06/24/2016 1632 Full Code [id number]  Vianne Bulls, MD ED   06/17/2016 0809 06/18/2016 1809 Full Code [id number]  [name], MD ED   05/31/2016 0118 05/31/2016 1724 Full Code [id number]  [name], MD Inpatient   05/29/2016 0239 05/30/2016 1743 Full Code [id number]  Norval Morton, MD Inpatient   05/26/2016 1204 05/27/2016 1449 Full Code [id number]  Roney Jaffe, MD Inpatient   05/20/2016  0141 05/21/2016 1839 Full Code [id number]  [name], MD ED   05/13/2016 2332 05/15/2016 1418 Full Code [id number]  [name], MD ED   04/01/2016 0034 04/01/2016 1818 Full Code [id number]  [name], DO ED   01/07/2016 2016 01/10/2016 1202 Full Code [id number]  Rogue Bussing, MD Inpatient   10/30/2015 0936 11/04/2015 1710 Full Code [id number]  [name], NP Inpatient   10/16/2015 2206 10/17/2015 1756 Full Code [id number]  [name], MD Inpatient   10/06/2015 0250 10/06/2015 1517 Full Code [id number]  Norval Morton, MD ED   09/23/2015 2317 09/27/2015 1704 Full Code [id number]  Norval Morton, MD Inpatient   09/08/2015 0407 09/11/2015 1740 Full Code [id number]  [name], MD Inpatient   08/18/2015 1806 08/20/2015 2049 Full Code [id number]  [name], MD Inpatient   06/19/2015 1204 06/19/2015 2049 Full Code [id number]  [name], MD Inpatient   02/11/2015 0209 02/16/2015 2207 Full Code [id number]  [name], MD ED   01/31/2015 2112 02/04/2015  [place] Full Code [id number]  [name], MD Inpatient   01/02/2015 1629 01/03/2015 1733 Full Code [id number]  [name], MD Inpatient   11/29/2014 0319 12/05/2014 1730 Full Code [id number]  [name], MD ED   11/17/2014 1006 11/19/2014 1217 Full Code [id number]  [name], MD ED   10/07/2014 1629 10/10/2014 1748 Full Code [id number]  [name], MD Inpatient   09/30/2014 2342 10/02/2014 1914 Full Code [id number]  [name], DO ED   09/02/2014 1516 09/02/2014 2215 Full Code [id number]  [name], MD Inpatient   08/30/2014 1829 09/02/2014 1516 Full Code [id number]  [name], NP ED   05/08/2014 0904 05/13/2014 1750 Full Code [id number]  [name], MD ED   04/05/2014 1916 04/06/2014 1504 Full Code [id number]  [name], MD Inpatient   03/27/2014 0943 03/27/2014 2138 Full Code [id number]  [name], MD Inpatient   01/19/2014 0535 01/19/2014 2019 Full Code  [id number]  [name], MD Inpatient   12/12/2013 2007 12/13/2013 1942 Full Code [id number]  [name], MD Inpatient   10/17/2013 1615 10/18/2013 1639 Full Code [id number]  [name], MD Inpatient   09/16/2013 2055 09/20/2013 1920 Full Code [id number]  [name], NP Inpatient   09/12/2013 0248 09/12/2013 2256 Full Code [id number]  [name], MD Inpatient   06/24/2013 1327 06/24/2013 1916 Full Code [id number]  [name], MD Inpatient   Advance Care Planning Activity     Family Communication: Discussed in detail with patient Disposition Plan: Patient remained in the hospital and was changed to inpatient requiring continued electrolyte monitoring, multiple IV pain medications for acute on chronic pancreatitis, continued subspecialty evaluation and management of end-stage renal disease.  Patient not medically stable for discharge. Will continue advancing diet slowly.  Consults called: None Admission status: inpatient.   Consultants:   None  Procedures:  Ct Abdomen Pelvis Wo Contrast  Result Date: 05/06/2019 CLINICAL DATA:  Increased abdominal distention. End-stage renal disease. Failed kidney transplant. Chronic pancreatitis. EXAM: CT ABDOMEN AND PELVIS WITHOUT CONTRAST TECHNIQUE: Multidetector CT imaging of the abdomen and pelvis was performed following the standard protocol without IV contrast. COMPARISON:  04/20/2019 FINDINGS: Lower chest: No acute findings. Stable right basilar pleural-parenchymal scarring with associated rounded atelectasis. Stable cardiomegaly and small pericardial effusion. Hepatobiliary: No mass visualized on this unenhanced exam. Gallbladder is unremarkable. No evidence of biliary ductal dilatation. Pancreas: No mass or inflammatory process visualized on this unenhanced exam. Spleen:  Within normal limits in size. Adrenals/Urinary tract: Normal adrenal glands. Diffuse atrophy of native kidneys again seen, consistent with end-stage renal disease. 3  cm high attenuation left renal lesion is stable, consistent with hemorrhagic cyst. Failed renal transplant with calcification is again seen in the left iliac fossa. Stomach/Bowel: No evidence of obstruction, inflammatory process, or abnormal fluid collections. Vascular/Lymphatic: No pathologically enlarged lymph nodes identified. No evidence of abdominal aortic aneurysm. Aortic atherosclerosis. Reproductive:  No mass or other significant abnormality. Other:  None. Musculoskeletal:  No suspicious bone lesions identified. IMPRESSION: No acute findings within the abdomen or pelvis. Stable appearance of failed, partially calcified renal transplant in left iliac fossa. Severe atrophy of native kidneys, consistent with end-stage renal disease. No evidence of hydronephrosis. Aortic Atherosclerosis (ICD10-I70.0). Electronically Signed   By: Marlaine Hind M.D.   On: 05/06/2019 13:05   Ct Abdomen Pelvis Wo Contrast  Result Date: 04/20/2019 CLINICAL DATA:  53 year old male with acute abdominal pain, nausea and vomiting. Patient  with end-stage renal disease on dialysis. EXAM: CT ABDOMEN AND PELVIS WITHOUT CONTRAST TECHNIQUE: Multidetector CT imaging of the abdomen and pelvis was performed following the standard protocol without IV contrast. COMPARISON:  06/22/2018 CT FINDINGS: Please note that parenchymal abnormalities may be missed without intravenous contrast. Lower chest: Moderate to marked cardiomegaly and small to moderate pericardial effusion again noted. Heavy coronary artery and aortic atherosclerotic calcifications noted. Small loculated RIGHT pleural effusion with moderate bibasilar atelectasis noted. Hepatobiliary: The liver and gallbladder are unremarkable. No biliary dilatation. Pancreas: Unremarkable Spleen: Unremarkable Adrenals/Urinary Tract: Atrophic kidneys again noted. A 2.7 x 3 cm slightly hyperdense mass within the LEFT kidney is unchanged from the prior study and decreased in size since 2018. A  dystrophic failed LEFT pelvic renal transplant again noted. The adrenal glands and visualized bladder are unremarkable. Stomach/Bowel: Stomach is within normal limits. No evidence of bowel wall thickening, distention, or inflammatory changes. Vascular/Lymphatic: Heavy aortic atherosclerosis and other vascular calcifications identified. No enlarged abdominal or pelvic lymph nodes. Reproductive: Prostate is unremarkable. Other: No ascites, focal collection or pneumoperitoneum. Musculoskeletal: No acute or suspicious bony abnormalities identified. IMPRESSION: 1. No evidence of acute abnormality. 2. Moderate to marked cardiomegaly and unchanged small to moderate pericardial effusion 3. Atrophic kidneys with unchanged LEFT renal lesion. 4. Small loculated RIGHT pleural effusion and moderate bibasilar atelectasis. 5. Coronary artery and Aortic Atherosclerosis (ICD10-I70.0). Electronically Signed   By: Margarette Canada M.D.   On: 04/20/2019 19:03   Dg Chest 2 View  Result Date: 04/17/2019 CLINICAL DATA:  Chest pain. EXAM: CHEST - 2 VIEW COMPARISON:  Most recent comparison 5 days ago 04/12/2019 FINDINGS: Chronic cardiomegaly, unchanged. Unchanged mediastinal contours. Chronic partially loculated right pleural effusion with associated basilar opacity. Vascular congestion and suggestion of mild pulmonary edema, also unchanged. Trace left pleural effusion. No new airspace disease or pneumothorax. Unchanged osseous structures. IMPRESSION: 1. Unchanged radiographic appearance of the chest over the past 5 days. 2. Chronic cardiomegaly with pulmonary edema. Bilateral pleural effusions, partially loculated on the right with associated basilar opacity. Electronically Signed   By: Keith Rake M.D.   On: 04/17/2019 00:35   Dg Chest Portable 1 View  Result Date: 05/06/2019 CLINICAL DATA:  Abdominal pain and vomiting EXAM: PORTABLE CHEST 1 VIEW COMPARISON:  04/17/2019, CT 04/20/2019 FINDINGS: Enlarged cardiac silhouette.  Loculated pleural fluid andatelectasis seen at the RIGHT lung base as described on CT 04/20/2019. Mild central venous pulmonary congestion. No focal consolidation. No pneumothorax. IMPRESSION: 1. No interval change. 2. Marked cardiomegaly. 3. RIGHT pleural effusion with basilar loculation and atelectasis. Electronically Signed   By: Suzy Bouchard M.D.   On: 05/06/2019 09:04   Dg Chest Port 1 View  Result Date: 04/12/2019 CLINICAL DATA:  Shortness of breath EXAM: PORTABLE CHEST 1 VIEW COMPARISON:  03/23/2019, 03/19/2019, CT chest 08/28/2018 FINDINGS: Moderate cardiomegaly with central vascular congestion and mild interstitial pulmonary edema. Moderate right pleural effusion with loculated appearance at the right base. Similar airspace disease at the right base. Convex contour. No pneumothorax. IMPRESSION: 1. Cardiomegaly with vascular congestion and mild diffuse interstitial pulmonary edema 2. No significant change in loculated appearing moderate right pleural effusion with persistent airspace disease and possible mass at the right lung base. Electronically Signed   By: Donavan Foil M.D.   On: 04/12/2019 01:07   Dg Abdomen Acute W/chest  Result Date: 04/19/2019 CLINICAL DATA:  Abdominal pain and vomiting. Symptoms for 3 days. EXAM: DG ABDOMEN ACUTE W/ 1V CHEST COMPARISON:  Radiograph 11/15/2018. CT 06/22/2018 FINDINGS:  Chronic cardiomegaly. Unchanged mediastinal contours. Chronic loculated pleuroparenchymal opacity at the right lung base and laterally, decreased pleural effusion component from prior exam. Pulmonary edema with mild progression. Small left pleural effusion. No free intra-abdominal air. Few prominent air-filled small bowel loops in the left abdomen without obstruction. Vascular calcifications and partially calcified renal transplant in the left lower quadrant. No acute osseous abnormalities are seen. IMPRESSION: 1. Few prominent air-filled small bowel loops in the left abdomen without  evidence obstruction. No free air. 2. Chronic loculated pleuroparenchymal opacity at the right lung base, decreased pleural fluid from prior exam. 3. Chronic cardiomegaly.  Pulmonary edema has mild progressed. Electronically Signed   By: Keith Rake M.D.   On: 04/19/2019 03:07    Antimicrobials:   None   Subjective: Afebrile, no chest pain, no shortness of breath.  Still reporting abdominal discomfort and nausea.  Small vomiting overnight, but willing to try full liquid diet.  Objective: Vitals:   05/09/19 1716 05/09/19 2212 05/10/19 0530 05/10/19 0934  BP: (!) 184/90 (!) 162/76 (!) 220/90 (!) 188/102  Pulse: 76 75 79 70  Resp:  _0 Temp: 97.8 F (36.6 C) 98.2 F (36.8 C) 98 F (36.7 C) 98 F (36.7 C)  TempSrc: Oral Oral Oral Oral  SpO2: 98% 97% 99% 99%  Weight:   73.6 kg   Height:        Intake/Output Summary (Last 24 hours) at 05/10/2019 1043 Last data filed at 05/10/2019 0940 Gross per 24 hour  Intake 720 ml  Output 0 ml  Net 720 ml   Filed Weights   05/08/19 2159 05/09/19 0159 05/10/19 0530  Weight: 76.2 kg 73.2 kg 73.6 kg    Examination: General exam: Alert, awake, oriented x 3; reports to having some nausea and abdominal discomfort for; 1 episode small vomiting overnight.  Patient is afebrile. Respiratory system: Clear to auscultation. Respiratory effort normal. Cardiovascular system:RRR. No murmurs, rubs, gallops. Gastrointestinal system: Abdomen is nondistended, soft and mildly tender to palpation in the mid epigastric region. No organomegaly or masses felt. Normal bowel sounds heard. Central nervous system: Alert and oriented. No focal neurological deficits. Extremities: No cyanosis or clubbing. Skin: No rashes, no petechiae.   Psychiatry: Judgement and insight appear normal. Mood & affect appropriate.    Data Reviewed: I have personally reviewed following labs and imaging studies  CBC: Recent Labs  Lab 05/06/19 1100 05/08/19 0747  WBC 4.5  4.6  NEUTROABS 3.0 3.0  HGB 10.9* 9.6*  HCT 34.3* 30.5*  MCV 90.5 89.7  PLT 132* 670   Basic Metabolic Panel: Recent Labs  Lab 05/06/19 1100 05/08/19 0747 05/08/19 2216  NA 140 140  --   K 5.1 6.4* 5.9*  CL 97* 98  --   CO2 24 22  --   GLUCOSE 71 66*  --   BUN 56* 85*  --   CREATININE 12.11* 15.10*  --   CALCIUM 8.2* 8.1*  --    GFR: Estimated Creatinine Clearance: 5.8 mL/min (A) (by C-G formula based on SCr of 15.1 mg/dL (H)).   Liver Function Tests: Recent Labs  Lab 05/06/19 1100 05/08/19 0747  AST 14* 12*  ALT 11 9  ALKPHOS 186* 168*  BILITOT 0.7 0.8  PROT 8.0 7.5  ALBUMIN 3.3* 3.4*   Recent Labs  Lab 05/06/19 1100 05/08/19 0010  LIPASE 217* 154*   CBG: Recent Labs  Lab 05/09/19 1647 05/09/19 2045 05/09/19 2355 05/10/19 0529 05/10/19 0736  GLUCAP 111* 134*  111* 75 76    Recent Results (from the past 240 hour(s))  SARS Coronavirus 2 by RT PCR (hospital order, performed in [facility] hospital lab) Nasopharyngeal Nasopharyngeal Swab     Status: None   Collection Time: 05/08/19 11:52 AM   Specimen: Nasopharyngeal Swab  Result Value Ref Range Status   SARS Coronavirus 2 NEGATIVE NEGATIVE Final    Comment: (NOTE) If result is NEGATIVE SARS-CoV-2 target nucleic acids are NOT DETECTED. The SARS-CoV-2 RNA is generally detectable in upper and lower  respiratory specimens during the acute phase of infection. The lowest  concentration of SARS-CoV-2 viral copies this assay can detect is 250  copies / mL. A negative result does not preclude SARS-CoV-2 infection  and should not be used as the sole basis for treatment or other  patient management decisions.  A negative result may occur with  improper specimen collection / handling, submission of specimen other  than nasopharyngeal swab, presence of viral mutation(s) within the  areas targeted by this assay, and inadequate number of viral copies  (<250 copies / mL). A negative result must be combined with  clinical  observations, patient history, and epidemiological information. If result is POSITIVE SARS-CoV-2 target nucleic acids are DETECTED. The SARS-CoV-2 RNA is generally detectable in upper and lower  respiratory specimens dur ing the acute phase of infection.  Positive  results are indicative of active infection with SARS-CoV-2.  Clinical  correlation with patient history and other diagnostic information is  necessary to determine patient infection status.  Positive results do  not rule out bacterial infection or co-infection with other viruses. If result is PRESUMPTIVE POSTIVE SARS-CoV-2 nucleic acids MAY BE PRESENT.   A presumptive positive result was obtained on the submitted specimen  and confirmed on repeat testing.  While 2019 novel coronavirus  (SARS-CoV-2) nucleic acids may be present in the submitted sample  additional confirmatory testing may be necessary for epidemiological  and / or clinical management purposes  to differentiate between  SARS-CoV-2 and other Sarbecovirus currently known to infect humans.  If clinically indicated additional testing with an alternate test  methodology [phone]) is advised. The SARS-CoV-2 RNA is generally  detectable in upper and lower respiratory sp ecimens during the acute  phase of infection. The expected result is Negative. Fact Sheet for Patients:  StrictlyIdeas.no Fact Sheet for Healthcare Providers: BankingDealers.co.za This test is not yet approved or cleared by the [place] FDA and has been authorized for detection and/or diagnosis of SARS-CoV-2 by FDA under an Emergency Use Authorization (EUA).  This EUA will remain in effect (meaning this test can be used) for the duration of the COVID-19 declaration under Section 564(b)(1) of the Act, 21 U.S.C. section 360bbb-3(b)(1), unless the authorization is terminated or revoked sooner. Performed at [facility],  [place] [address] [place], [place] [address]      Scheduled Meds:  [START ON 05/11/2019] amLODipine  10 mg Oral Daily   apixaban  5 mg Oral BID   Chlorhexidine Gluconate Cloth  6 each Topical Q0600   feeding supplement (NEPRO CARB STEADY)  237 mL Oral TID WC   hydrALAZINE  100 mg Oral TID   insulin aspart  0-9 Units Subcutaneous Q4H   labetalol  100 mg Oral BID   oxyCODONE  5 mg Oral Once   pantoprazole  40 mg Oral Daily   senna-docusate  2 tablet Oral QHS   sodium chloride flush  3 mL Intravenous Q12H   Continuous Infusions:  sodium chloride     sodium chloride       LOS: 1 day    Time spent: 30 min    Barton Dubois, MD [phone]  [facility]   05/10/2019, 10:43 AM

## 2019-05-10 NOTE — Progress Notes (Addendum)
[facility] Progress Note   Subjective:   Patient seen and examined at bedside.  Reports ongoing abdominal pain and n/v, able to keep down a little of his clear liquid diet this AM.  No SOB, CP, weakness, dizziness or fatigue.   Objective Vitals:   05/09/19 1716 05/09/19 2212 05/10/19 0530 05/10/19 0934  BP: (!) 184/90 (!) 162/76 (!) 220/90 (!) 188/102  Pulse: 76 75 79 70  Resp:  _0 Temp: 97.8 F (36.6 C) 98.2 F (36.8 C) 98 F (36.7 C) 98 F (36.7 C)  TempSrc: Oral Oral Oral Oral  SpO2: 98% 97% 99% 99%  Weight:   73.6 kg   Height:       Physical Exam General:NAD, WDWN male, laying in bed Heart:RRR Lungs:CTAB Abdomen:ND, +tenderness Extremities:no LE edema Dialysis Access: LU AVF +b   Filed Weights   05/08/19 2159 05/09/19 0159 05/10/19 0530  Weight: 76.2 kg 73.2 kg 73.6 kg    Intake/Output Summary (Last 24 hours) at 05/10/2019 1221 Last data filed at 05/10/2019 0940 Gross per 24 hour  Intake 720 ml  Output 0 ml  Net 720 ml    Additional Objective Labs: Basic Metabolic Panel: Recent Labs  Lab 05/06/19 1100 05/08/19 0747 05/08/19 2216  NA 140 140  --   K 5.1 6.4* 5.9*  CL 97* 98  --   CO2 24 22  --   GLUCOSE 71 66*  --   BUN 56* 85*  --   CREATININE 12.11* 15.10*  --   CALCIUM 8.2* 8.1*  --    Liver Function Tests: Recent Labs  Lab 05/06/19 1100 05/08/19 0747  AST 14* 12*  ALT 11 9  ALKPHOS 186* 168*  BILITOT 0.7 0.8  PROT 8.0 7.5  ALBUMIN 3.3* 3.4*   Recent Labs  Lab 05/06/19 1100 05/08/19 0010  LIPASE 217* 154*   CBC: Recent Labs  Lab 05/06/19 1100 05/08/19 0747  WBC 4.5 4.6  NEUTROABS 3.0 3.0  HGB 10.9* 9.6*  HCT 34.3* 30.5*  MCV 90.5 89.7  PLT 132* 150   Blood Culture    Component Value Date/Time   SDES PLEURAL 07/29/2018 1300   SDES PLEURAL 07/29/2018 1300   SPECREQUEST NONE 07/29/2018 1300   SPECREQUEST NONE 07/29/2018 1300   CULT  07/29/2018 1300    NO GROWTH 5 DAYS Performed at [facility], [place] [address], [place], [place] [address]    REPTSTATUS 08/03/2018 FINAL 07/29/2018 1300   REPTSTATUS 07/29/2018 FINAL 07/29/2018 1300    CBG: Recent Labs  Lab 05/09/19 1647 05/09/19 2045 05/09/19 2355 05/10/19 0529 05/10/19 0736  GLUCAP 111* 134* 111* 75 76   Studies/Results: No results found.  Medications: . sodium chloride    . sodium chloride     . [START ON 05/11/2019] amLODipine  10 mg Oral Daily  . apixaban  5 mg Oral BID  . Chlorhexidine Gluconate Cloth  6 each Topical Q0600  . feeding supplement (NEPRO CARB STEADY)  237 mL Oral TID WC  . hydrALAZINE  100 mg Oral TID  . insulin aspart  0-9 Units Subcutaneous Q4H  . labetalol  100 mg Oral BID  . oxyCODONE  5 mg Oral Once  . pantoprazole  40 mg Oral Daily  . senna-docusate  2 tablet Oral QHS  . sodium chloride flush  3 mL Intravenous Q12H    Dialysis Orders: [facility] 400/800 72.5kg 2/2.25 bath AVF No heparin - parsabiv 5 mg tiw -  mircera 200 ug every 2 wks, last 10/13 - sod thiosulfate 25 gm tiw IV  Home meds: - amlodipine 5 qd/ hydralazine 100 tid/ lisinopril 5 qd - omeprazole 40 qd - apixaban 5 bid - sl ntg prn - prn's/ vitamins/ supplements  Assessment/Plan: 1. Acute/chronic abdominal pain/^lipase, chronic/recurrent pancreatitis - Per primary 2. Hyperkalemia - improved post HD. Lokelma given. Last K 5.9. 2. ESRD -on HD TTS. Orders written for HD today per regular schedule.  3. Anemia of CKD- last Hgb 9.6. ESA recently dosed. 4. Secondary hyperparathyroidism - Ca at goal. Will check phos. Continue binders once diet advanced - Fosrenol 1AC TID. No parsabiv b/c not on formulary. 5. HTN/volume - BP elevated. Continue home meds, PO labetalol added. 1kg over dry.  Does not appear volume overloaded.  6. Nutrition - Renal diet w/fluid restrictions.  7. 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  Kelly Splinter  MD 05/10/2019, 3:11 PM

## 2019-05-10 NOTE — Discharge Instructions (Signed)
Information on my medicine - ELIQUIS (apixaban)  Why was Eliquis prescribed for you? Eliquis was prescribed for you to reduce the risk of forming blood clots that can cause a stroke if you have a medical condition called atrial fibrillation (a type of irregular heartbeat) OR to reduce the risk of a blood clots forming after orthopedic surgery.  What do You need to know about Eliquis ? Take your Eliquis TWICE DAILY - one tablet in the morning and one tablet in the evening with or without food.  It would be best to take the doses about the same time each day.  If you have difficulty swallowing the tablet whole please discuss with your pharmacist how to take the medication safely.  Take Eliquis exactly as prescribed by your doctor and DO NOT stop taking Eliquis without talking to the doctor who prescribed the medication.  Stopping may increase your risk of developing a new clot or stroke.  Refill your prescription before you run out.  After discharge, you should have regular check-up appointments with your healthcare provider that is prescribing your Eliquis.  In the future your dose may need to be changed if your kidney function or weight changes by a significant amount or as you get older.  What do you do if you miss a dose? If you miss a dose, take it as soon as you remember on the same day and resume taking twice daily.  Do not take more than one dose of ELIQUIS at the same time.  Important Safety Information A possible side effect of Eliquis is bleeding. You should call your healthcare provider right away if you experience any of the following: ? Bleeding from an injury or your nose that does not stop. ? Unusual colored urine (red or dark brown) or unusual colored stools (red or black). ? Unusual bruising for unknown reasons. ? A serious fall or if you hit your head (even if there is no bleeding).  Some medicines may interact with Eliquis and might increase your risk of bleeding  or clotting while on Eliquis. To help avoid this, consult your healthcare provider or pharmacist prior to using any new prescription or non-prescription medications, including herbals, vitamins, non-steroidal anti-inflammatory drugs (NSAIDs) and supplements.  This website has more information on Eliquis (apixaban): [email or web address].

## 2019-05-11 DIAGNOSIS — K859 Acute pancreatitis without necrosis or infection, unspecified: Principal | ICD-10-CM

## 2019-05-11 LAB — COMPREHENSIVE METABOLIC PANEL
ALT: 7 U/L (ref 0–44)
AST: 11 U/L — ABNORMAL LOW (ref 15–41)
Albumin: 2.9 g/dL — ABNORMAL LOW (ref 3.5–5.0)
Alkaline Phosphatase: 155 U/L — ABNORMAL HIGH (ref 38–126)
Anion gap: 16 — ABNORMAL HIGH (ref 5–15)
BUN: 57 mg/dL — ABNORMAL HIGH (ref 6–20)
CO2: 24 mmol/L (ref 22–32)
Calcium: 7.8 mg/dL — ABNORMAL LOW (ref 8.9–10.3)
Chloride: 99 mmol/L (ref 98–111)
Creatinine, Ser: 14.66 mg/dL — ABNORMAL HIGH (ref 0.61–1.24)
GFR calc Af Amer: 4 mL/min — ABNORMAL LOW (ref >60)
GFR calc non Af Amer: 3 mL/min — ABNORMAL LOW (ref >60)
Glucose, Bld: 87 mg/dL (ref 70–99)
Potassium: 4.3 mmol/L (ref 3.5–5.1)
Sodium: 139 mmol/L (ref 135–145)
Total Bilirubin: 1.2 mg/dL (ref 0.3–1.2)
Total Protein: 6.9 g/dL (ref 6.5–8.1)

## 2019-05-11 LAB — GLUCOSE, CAPILLARY
Glucose-Capillary: 153 mg/dL — ABNORMAL HIGH (ref 70–99)
Glucose-Capillary: 90 mg/dL (ref 70–99)
Glucose-Capillary: 105 mg/dL — ABNORMAL HIGH (ref 70–99)

## 2019-05-11 LAB — CBC
HCT: 30.6 % — ABNORMAL LOW (ref 39.0–52.0)
Hemoglobin: 9.9 g/dL — ABNORMAL LOW (ref 13.0–17.0)
MCH: 28.7 pg (ref 26.0–34.0)
MCHC: 32.4 g/dL (ref 30.0–36.0)
MCV: 88.7 fL (ref 80.0–100.0)
Platelets: 158 10*3/uL (ref 150–400)
RBC: 3.45 MIL/uL — ABNORMAL LOW (ref 4.22–5.81)
RDW: 17.3 % — ABNORMAL HIGH (ref 11.5–15.5)
WBC: 4.4 10*3/uL (ref 4.0–10.5)
nRBC: 0 % (ref 0.0–0.2)

## 2019-05-11 LAB — LIPASE, BLOOD: Lipase: 27 U/L (ref 11–51)

## 2019-05-11 MED ORDER — INSULIN ASPART 100 UNIT/ML ~~LOC~~ SOLN: 0.0000 [IU] | Freq: Three times a day (TID) | SUBCUTANEOUS | Status: DC

## 2019-05-11 NOTE — Progress Notes (Addendum)
[facility] Progress Note   Subjective:   Patient seen and examined at bedside.  States he did not tolerating breakfast this AM and continues to have mild pain.  Reports bleeding from LU AVF this AM for about an hour. No recent interventions or access issues.   Objective Vitals:   05/10/19 0934 05/10/19 1433 05/10/19 1749 05/10/19 2055  BP: (!) 188/102 (!) 190/95 (!) 145/76 (!) 190/115  Pulse: 70 70 75 78  Resp: _0 Temp: 98 F (36.7 C) 98.3 F (36.8 C) 98.5 F (36.9 C) 98.3 F (36.8 C)  TempSrc: Oral Oral Oral Oral  SpO2: 99% 99% 99% 98%  Weight:      Height:       Physical Exam General:NAD, WDWN, laying in bed Heart:RRR Lungs:CTAB Abdomen:ND, +BS Extremities:no LE edema Dialysis Access: LU AVF +b   Filed Weights   05/08/19 2159 05/09/19 0159 05/10/19 0530  Weight: 76.2 kg 73.2 kg 73.6 kg    Intake/Output Summary (Last 24 hours) at 05/11/2019 1146 Last data filed at 05/11/2019 0900 Gross per 24 hour  Intake 937 ml  Output 500 ml  Net 437 ml    Additional Objective Labs: Basic Metabolic Panel: Recent Labs  Lab 05/06/19 1100 05/08/19 0747 05/08/19 2216  NA 140 140  --   K 5.1 6.4* 5.9*  CL 97* 98  --   CO2 24 22  --   GLUCOSE 71 66*  --   BUN 56* 85*  --   CREATININE 12.11* 15.10*  --   CALCIUM 8.2* 8.1*  --    Liver Function Tests: Recent Labs  Lab 05/06/19 1100 05/08/19 0747  AST 14* 12*  ALT 11 9  ALKPHOS 186* 168*  BILITOT 0.7 0.8  PROT 8.0 7.5  ALBUMIN 3.3* 3.4*   Recent Labs  Lab 05/06/19 1100 05/08/19 0010  LIPASE 217* 154*   CBC: Recent Labs  Lab 05/06/19 1100 05/08/19 0747  WBC 4.5 4.6  NEUTROABS 3.0 3.0  HGB 10.9* 9.6*  HCT 34.3* 30.5*  MCV 90.5 89.7  PLT 132* 150   Blood Culture    Component Value Date/Time   SDES PLEURAL 07/29/2018 1300   SDES PLEURAL 07/29/2018 1300   SPECREQUEST NONE 07/29/2018 1300   SPECREQUEST NONE 07/29/2018 1300   CULT  07/29/2018 1300    NO GROWTH 5  DAYS Performed at [facility], [place] [address] [place], [place] [address]    REPTSTATUS 08/03/2018 FINAL 07/29/2018 1300   REPTSTATUS 07/29/2018 FINAL 07/29/2018 1300   CBG: Recent Labs  Lab 05/10/19 0736 05/10/19 1221 05/10/19 1628 05/11/19 0737 05/11/19 1103  GLUCAP 76 124* 118* 153* 90    Medications: . sodium chloride    . sodium chloride    . sodium thiosulfate infusion for calciphylaxis     . amLODipine  10 mg Oral Daily  . apixaban  5 mg Oral BID  . Chlorhexidine Gluconate Cloth  6 each Topical Q0600  . feeding supplement (NEPRO CARB STEADY)  237 mL Oral TID WC  . hydrALAZINE  100 mg Oral TID  . insulin aspart  0-9 Units Subcutaneous Q4H  . labetalol  100 mg Oral BID  . oxyCODONE  5 mg Oral Once  . pantoprazole  40 mg Oral Daily  . senna-docusate  2 tablet Oral QHS  . sodium chloride flush  3 mL Intravenous Q12H    Dialysis Orders: [facility] 400/800 72.5kg 2/2.25 bath AVF No heparin - Hermina Staggers  5 mg tiw - mircera 200 ug every 2 wks, last 10/13 - sod thiosulfate 25 gm tiw IV  Home meds: - amlodipine 5 qd/ hydralazine 100 tid/ lisinopril 5 qd - omeprazole 40 qd - apixaban 5 bid - sl ntg prn - prn's/ vitamins/ supplements  Assessment/Plan: 1. Acute/chronic abdominal pain/ ^ lipase - c/w pancreatitis - Per primary 2. Hyperkalemia - improved post HD. Lokelma given. Checking labs today, has been refusing labs the last few days. 2. ESRD -on HD TTS. Orders written for HD today per regular schedule. Sodium thiosulfate w/HD. 3. Anemia of CKD- last Hgb 9.6. ESA recently dosed.  Follow Hgb.  4. Secondary hyperparathyroidism - Ca at goal. Checking phos today. Continue binders once diet advanced - Fosrenol 1AC TID. No parsabiv b/c not on formulary. 5. HTN/volume - BP elevated. Continue home meds, PO labetalol added. 1kg over dry.  Does not appear volume overloaded, titrate down as tolerated.  6. Nutrition - Renal diet w/fluid restrictions.   7. 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  Kelly Splinter  MD 05/11/2019, 2:26 PM

## 2019-05-11 NOTE — Progress Notes (Signed)
PROGRESS NOTE    BRITTANY AMIRAULT  FBP:[id number] DOB: 17-Apr-1966 DOA: 05/07/2019 PCP: Patient, [name]   Brief Narrative:  [name] a53 y.o.male,wh/o DVT/ PE,hypertension, hyperlipidemia, dm2, ESRD on HD (TTS), CHF (diastolic), h/o cirrhosis, chronic pancreatitis w pseudocyst presents with c/o acute on chronic abdominal pain. He missed his dialysis on Thursday and was only was able to complete 1.5 hours of dialysis on Saturday due to his abdominal pain.   In ED,  CT abd/ pelvis 05/06/2019 IMPRESSION: No acute findings within the abdomen or pelvis. Stable appearance of failed, partially calcified renal transplant in left iliac fossa. Severe atrophy of native kidneys, consistent with end-stage renal disease. No evidence of hydronephrosis. Aortic Atherosclerosis (ICD10-I70.0).  Pt was admitted for acute on chronic abdominal pain likely secondary to acute pancreatitis as well as hyperkalemia, and need for dialysis.   Assessment & Plan:   Principal Problem:   Hyperkalemia Active Problems:   DM (diabetes mellitus), type 2, uncontrolled, with renal complications (HCC)   Essential hypertension   Recurrent abdominal pain   End-stage renal disease on hemodialysis (HCC)   Chronic pancreatitis (HCC)   Pancreatitis, acute   Acute on chronic abdominal pain  Secondary to chronic pancreatitis Advancing diet but poorly toelrated secondary to continued pain Decreased dilaudid to 53m iv q4h prn  zofran 440miv q6h prn   Hyperkalemia Lisinopril discontinued on admission calclium gluconate 1gm iv x1 on admission Sodium bicarbonate 1 amp iv x1 on admission LoSardisonsult Nephrology consulted appreciated-further recs per neph  ESRD on HD TTS Nephrology consulted by ED, appreciate input  Hypertension STOPPED Lisinopril as above Cont Hydralazine 10025mo tid Cont Amlodipine 5mg69m qday Hydralazine 10mg18mq6h prn  Dm2 fsbs q4h, ISS  Gerd Cont  PPI  Chronic nausea zofran as above, if not effective phenergan 6.25mg 26m6h prn   H/o PE Cont Eliquis pharmacy to dose  DVT prophylaxis: On:EliOE:UMPNTIR Status: Full    Code Status Orders  (From admission, onward)         Start     Ordered   05/08/19 1148  Full code  Continuous     05/08/19 1155        Code Status History    Date Active Date Inactive Code Status Order ID Comments User Context   04/19/2019 0837 04/21/2019 1507 Full Code [id number],Lavina HammanD   04/12/2019 0723 04/14/2019 1608 Full Code [id number] XIvor CostaD [place]8/20/2020 0610 03/12/2019 1728 Full Code [id number],Reubin MilanD   07/26/2018 0355 08/05/2018 1350 Full Code [id number] Osmondnpatient   07/25/2018 1747 07/26/2018 0355 Full Code [id number] OsmondD   06/22/2018 1627 06/24/2018 2025 Full Code [id number],Karmen BongoD   06/02/2018 2156 06/04/2018 1252 Full Code [id number] QuillD   05/09/2018 1935 05/15/2018 1818 Full Code [id number] XIvor CostaD   04/25/2018 1652 04/27/2018 1618 Full Code [id number], Cristal Deernpatient   02/14/2018 1501 02/18/2018 1352 Full Code [id number] DeutscherD   01/31/2018 1609 02/06/2018 0349 Full Code [id number] DeutscherD   01/16/2018 0501 01/19/2018 2127 Full Code [id number] XIvor CostaD   11/09/2017 1027 11/11/2017 1706 Full Code [id number] PartyD   09/21/2017 1320 09/25/2017 0301 Full Code [id number] Floss  Johnn Hai, [place] Inpatient   08/24/2017 1108 09/04/2017 2358 Full Code [id number]  Sela Hua, MD ED   08/09/2017 0343 08/12/2017 1923 Full Code [id number]  Rory Percy, DO ED   07/21/2017 2129 07/24/2017 1315 Full Code [id number]  [place] Bing, DO Inpatient   07/03/2017 1457 07/04/2017 1435 Full Code [id number]  Mercy Riding, MD ED   06/25/2017 0618 06/27/2017 1808 Full Code [id number]  Mercy Riding, MD Inpatient    05/04/2017 1627 05/06/2017 1644 Full Code [id number]  [name], MD Inpatient   04/27/2017 2201 04/28/2017 1736 DNR [id number]  Elwin Mocha, MD ED   04/21/2017 0118 04/22/2017 1426 Full Code [id number]  Jani Gravel, MD ED   03/13/2017 0306 03/15/2017 1230 Full Code [id number]  [name], DO ED   02/24/2017 0239 02/25/2017 1049 Full Code [id number]  Karmen Bongo, MD Inpatient   01/17/2017 0620 01/17/2017 1023 Full Code [id number]  Maryellen Pile, MD Inpatient   01/12/2017 0508 01/14/2017 1729 Full Code [id number]  Shela Leff, MD ED   01/05/2017 0249 01/07/2017 1825 Full Code [id number]  Holley Raring, MD ED   11/29/2016 1726 12/01/2016 1810 Full Code [id number]  Milagros Loll, MD Inpatient   10/13/2016 2049 10/15/2016 1300 Full Code [id number]  Reubin Milan, MD Inpatient   10/05/2016 1735 10/08/2016 1802 Full Code [id number]  Verlee Monte, MD Inpatient   08/04/2016 0750 08/08/2016 1800 Full Code [id number]  Rondel Jumbo, PA-C ED   07/14/2016 2319 07/16/2016 1813 Full Code [id number]  Vianne Bulls, MD ED   07/11/2016 0043 07/11/2016 1755 Full Code [id number]  Orson Eva, MD Inpatient   06/24/2016 2159 06/26/2016 1628 Full Code [id number]  Vianne Bulls, MD ED   06/22/2016 2058 06/24/2016 1632 Full Code [id number]  Vianne Bulls, MD ED   06/17/2016 0809 06/18/2016 1809 Full Code [id number]  [name], MD ED   05/31/2016 0118 05/31/2016 1724 Full Code [id number]  [name], MD Inpatient   05/29/2016 0239 05/30/2016 1743 Full Code [id number]  Norval Morton, MD Inpatient   05/26/2016 1204 05/27/2016 1449 Full Code [id number]  Roney Jaffe, MD Inpatient   05/20/2016 0141 05/21/2016 1839 Full Code [id number]  [name], MD ED   05/13/2016 2332 05/15/2016 1418 Full Code [id number]  [name], MD ED   04/01/2016 0034 04/01/2016 1818 Full Code [id number]  [name], DO ED   01/07/2016 2016 01/10/2016 1202 Full Code [id number]  Rogue Bussing, MD Inpatient   10/30/2015 0936 11/04/2015 1710 Full Code [id number]  [name], NP Inpatient   10/16/2015 2206 10/17/2015 1756 Full Code [id number]  [name], MD Inpatient   10/06/2015 0250 10/06/2015 1517 Full Code [id number]  Norval Morton, MD ED   09/23/2015 2317 09/27/2015 1704 Full Code [id number]  Norval Morton, MD Inpatient   09/08/2015 0407 09/11/2015 1740 Full Code [id number]  [name], MD Inpatient   08/18/2015 1806 08/20/2015 2049 Full Code [id number]  [name], MD Inpatient   06/19/2015 1204 06/19/2015 2049 Full Code [id number]  [name], MD Inpatient   02/11/2015 0209 02/16/2015 2207 Full Code [id number]  [name], MD ED   01/31/2015 2112 02/04/2015 1650 Full Code [id number]  [name], MD Inpatient   01/02/2015 1629 01/03/2015 1733 Full Code [id number]  [name], MD Inpatient   11/29/2014 0319 12/05/2014 1730 Full Code [id number]  [name], MD ED   11/17/2014 1006 11/19/2014 1217  Full Code [id number]  [name], MD ED   10/07/2014 1629 10/10/2014 1748 Full Code [id number]  [name], MD Inpatient   09/30/2014 2342 10/02/2014 1914 Full Code [id number]  [name], DO ED   09/02/2014 1516 09/02/2014 2215 Full Code [id number]  [name], MD Inpatient   08/30/2014 1829 09/02/2014 1516 Full Code [id number]  [name], NP ED   05/08/2014 0904 05/13/2014 1750 Full Code [id number]  [name], MD ED   04/05/2014 1916 04/06/2014 1504 Full Code [id number]  [name], MD Inpatient   03/27/2014 0943 03/27/2014 2138 Full Code [id number]  [name], MD Inpatient   01/19/2014 0535 01/19/2014 2019 Full Code [id number]  [name], MD Inpatient   12/12/2013 2007 12/13/2013 1942 Full Code [id number]  [name], MD Inpatient   10/17/2013 1615 10/18/2013 1639 Full Code [id number]  [name], MD Inpatient   09/16/2013 2055 09/20/2013 1920 Full Code [id number]  [name], NP Inpatient   09/12/2013  0248 09/12/2013 2256 Full Code [id number]  [name], MD Inpatient   06/24/2013 1327 06/24/2013 1916 Full Code [id number]  [name], MD Inpatient   Advance Care Planning Activity     Family Communication: Discussed in detail with patient Disposition Plan:  Patient remained in the hospital as inpatient requiring continued electrolyte monitoring, multiple IV pain medications for acute on chronic pancreatitis, inability to toelrate PO diet, continued subspecialty evaluation and management of end-stage renal disease.  Patient not medically stable for discharge  Consults called: None Admission status: Inpatient   Consultants:   renal  Procedures:  Ct Abdomen Pelvis Wo Contrast  Result Date: 05/06/2019 CLINICAL DATA:  Increased abdominal distention. End-stage renal disease. Failed kidney transplant. Chronic pancreatitis. EXAM: CT ABDOMEN AND PELVIS WITHOUT CONTRAST TECHNIQUE: Multidetector CT imaging of the abdomen and pelvis was performed following the standard protocol without IV contrast. COMPARISON:  04/20/2019 FINDINGS: Lower chest: No acute findings. Stable right basilar pleural-parenchymal scarring with associated rounded atelectasis. Stable cardiomegaly and small pericardial effusion. Hepatobiliary: No mass visualized on this unenhanced exam. Gallbladder is unremarkable. No evidence of biliary ductal dilatation. Pancreas: No mass or inflammatory process visualized on this unenhanced exam. Spleen:  Within normal limits in size. Adrenals/Urinary tract: Normal adrenal glands. Diffuse atrophy of native kidneys again seen, consistent with end-stage renal disease. 3 cm high attenuation left renal lesion is stable, consistent with hemorrhagic cyst. Failed renal transplant with calcification is again seen in the left iliac fossa. Stomach/Bowel: No evidence of obstruction, inflammatory process, or abnormal fluid collections. Vascular/Lymphatic: No pathologically enlarged lymph nodes identified. No  evidence of abdominal aortic aneurysm. Aortic atherosclerosis. Reproductive:  No mass or other significant abnormality. Other:  None. Musculoskeletal:  No suspicious bone lesions identified. IMPRESSION: No acute findings within the abdomen or pelvis. Stable appearance of failed, partially calcified renal transplant in left iliac fossa. Severe atrophy of native kidneys, consistent with end-stage renal disease. No evidence of hydronephrosis. Aortic Atherosclerosis (ICD10-I70.0). Electronically Signed   By: Marlaine Hind M.D.   On: 05/06/2019 13:05   Ct Abdomen Pelvis Wo Contrast  Result Date: 04/20/2019 CLINICAL DATA:  53 year old male with acute abdominal pain, nausea and vomiting. Patient with end-stage renal disease on dialysis. EXAM: CT ABDOMEN AND PELVIS WITHOUT CONTRAST TECHNIQUE: Multidetector CT imaging of the abdomen and pelvis was performed following the standard protocol without IV contrast. COMPARISON:  06/22/2018 CT FINDINGS: Please note that parenchymal abnormalities may be missed without intravenous contrast. Lower  chest: Moderate to marked cardiomegaly and small to moderate pericardial effusion again noted. Heavy coronary artery and aortic atherosclerotic calcifications noted. Small loculated RIGHT pleural effusion with moderate bibasilar atelectasis noted. Hepatobiliary: The liver and gallbladder are unremarkable. No biliary dilatation. Pancreas: Unremarkable Spleen: Unremarkable Adrenals/Urinary Tract: Atrophic kidneys again noted. A 2.7 x 3 cm slightly hyperdense mass within the LEFT kidney is unchanged from the prior study and decreased in size since 2018. A dystrophic failed LEFT pelvic renal transplant again noted. The adrenal glands and visualized bladder are unremarkable. Stomach/Bowel: Stomach is within normal limits. No evidence of bowel wall thickening, distention, or inflammatory changes. Vascular/Lymphatic: Heavy aortic atherosclerosis and other vascular calcifications identified. No  enlarged abdominal or pelvic lymph nodes. Reproductive: Prostate is unremarkable. Other: No ascites, focal collection or pneumoperitoneum. Musculoskeletal: No acute or suspicious bony abnormalities identified. IMPRESSION: 1. No evidence of acute abnormality. 2. Moderate to marked cardiomegaly and unchanged small to moderate pericardial effusion 3. Atrophic kidneys with unchanged LEFT renal lesion. 4. Small loculated RIGHT pleural effusion and moderate bibasilar atelectasis. 5. Coronary artery and Aortic Atherosclerosis (ICD10-I70.0). Electronically Signed   By: Margarette Canada M.D.   On: 04/20/2019 19:03   Dg Chest 2 View  Result Date: 04/17/2019 CLINICAL DATA:  Chest pain. EXAM: CHEST - 2 VIEW COMPARISON:  Most recent comparison 5 days ago 04/12/2019 FINDINGS: Chronic cardiomegaly, unchanged. Unchanged mediastinal contours. Chronic partially loculated right pleural effusion with associated basilar opacity. Vascular congestion and suggestion of mild pulmonary edema, also unchanged. Trace left pleural effusion. No new airspace disease or pneumothorax. Unchanged osseous structures. IMPRESSION: 1. Unchanged radiographic appearance of the chest over the past 5 days. 2. Chronic cardiomegaly with pulmonary edema. Bilateral pleural effusions, partially loculated on the right with associated basilar opacity. Electronically Signed   By: Keith Rake M.D.   On: 04/17/2019 00:35   Dg Chest Portable 1 View  Result Date: 05/06/2019 CLINICAL DATA:  Abdominal pain and vomiting EXAM: PORTABLE CHEST 1 VIEW COMPARISON:  04/17/2019, CT 04/20/2019 FINDINGS: Enlarged cardiac silhouette. Loculated pleural fluid andatelectasis seen at the RIGHT lung base as described on CT 04/20/2019. Mild central venous pulmonary congestion. No focal consolidation. No pneumothorax. IMPRESSION: 1. No interval change. 2. Marked cardiomegaly. 3. RIGHT pleural effusion with basilar loculation and atelectasis. Electronically Signed   By: Suzy Bouchard M.D.   On: 05/06/2019 09:04   Dg Chest Port 1 View  Result Date: 04/12/2019 CLINICAL DATA:  Shortness of breath EXAM: PORTABLE CHEST 1 VIEW COMPARISON:  03/23/2019, 03/19/2019, CT chest 08/28/2018 FINDINGS: Moderate cardiomegaly with central vascular congestion and mild interstitial pulmonary edema. Moderate right pleural effusion with loculated appearance at the right base. Similar airspace disease at the right base. Convex contour. No pneumothorax. IMPRESSION: 1. Cardiomegaly with vascular congestion and mild diffuse interstitial pulmonary edema 2. No significant change in loculated appearing moderate right pleural effusion with persistent airspace disease and possible mass at the right lung base. Electronically Signed   By: Donavan Foil M.D.   On: 04/12/2019 01:07   Dg Abdomen Acute W/chest  Result Date: 04/19/2019 CLINICAL DATA:  Abdominal pain and vomiting. Symptoms for 3 days. EXAM: DG ABDOMEN ACUTE W/ 1V CHEST COMPARISON:  Radiograph 11/15/2018. CT 06/22/2018 FINDINGS: Chronic cardiomegaly. Unchanged mediastinal contours. Chronic loculated pleuroparenchymal opacity at the right lung base and laterally, decreased pleural effusion component from prior exam. Pulmonary edema with mild progression. Small left pleural effusion. No free intra-abdominal air. Few prominent air-filled small bowel loops in the left abdomen without obstruction.  Vascular calcifications and partially calcified renal transplant in the left lower quadrant. No acute osseous abnormalities are seen. IMPRESSION: 1. Few prominent air-filled small bowel loops in the left abdomen without evidence obstruction. No free air. 2. Chronic loculated pleuroparenchymal opacity at the right lung base, decreased pleural fluid from prior exam. 3. Chronic cardiomegaly.  Pulmonary edema has mild progressed. Electronically Signed   By: Keith Rake M.D.   On: 04/19/2019 03:07     Antimicrobials:   none    Subjective: Patient reports  inability to tolerate diet with nausea and vomiting Also reported abdominal pain with p.o. intake  Objective: Vitals:   05/10/19 0934 05/10/19 1433 05/10/19 1749 05/10/19 2055  BP: (!) 188/102 (!) 190/95 (!) 145/76 (!) 190/115  Pulse: 70 70 75 78  Resp: _0 Temp: 98 F (36.7 C) 98.3 F (36.8 C) 98.5 F (36.9 C) 98.3 F (36.8 C)  TempSrc: Oral Oral Oral Oral  SpO2: 99% 99% 99% 98%  Weight:      Height:        Intake/Output Summary (Last 24 hours) at 05/11/2019 0901 Last data filed at 05/10/2019 2200 Gross per 24 hour  Intake 920 ml  Output 900 ml  Net 20 ml   Filed Weights   05/08/19 2159 05/09/19 0159 05/10/19 0530  Weight: 76.2 kg 73.2 kg 73.6 kg    Examination:  General exam: Appears calm and comfortable  Respiratory system: Clear to auscultation. Respiratory effort normal. Cardiovascular system: S1 & S2 heard, RRR. No JVD, murmurs, rubs, gallops or clicks. No pedal edema. Gastrointestinal system: Abdomen is still mildly tender to palpation epigastric region. No organomegaly or masses felt although limited exam secondary to pain per patient. Normal bowel sounds heard. Central nervous system: Alert and oriented. No focal neurological deficits. Extremities: Does move all 4 extremities freely, no focal neurological deficit noted Skin: No rashes, lesions or ulcers Psychiatry: Judgement and insight appear normal. Mood & affect appropriate with me .     Data Reviewed: I have personally reviewed following labs and imaging studies  CBC: Recent Labs  Lab 05/06/19 1100 05/08/19 0747  WBC 4.5 4.6  NEUTROABS 3.0 3.0  HGB 10.9* 9.6*  HCT 34.3* 30.5*  MCV 90.5 89.7  PLT 132* 335   Basic Metabolic Panel: Recent Labs  Lab 05/06/19 1100 05/08/19 0747 05/08/19 2216  NA 140 140  --   K 5.1 6.4* 5.9*  CL 97* 98  --   CO2 24 22  --   GLUCOSE 71 66*  --   BUN 56* 85*  --   CREATININE 12.11* 15.10*  --   CALCIUM 8.2* 8.1*  --    GFR: Estimated Creatinine  Clearance: 5.8 mL/min (A) (by C-G formula based on SCr of 15.1 mg/dL (H)). Liver Function Tests: Recent Labs  Lab 05/06/19 1100 05/08/19 0747  AST 14* 12*  ALT 11 9  ALKPHOS 186* 168*  BILITOT 0.7 0.8  PROT 8.0 7.5  ALBUMIN 3.3* 3.4*   Recent Labs  Lab 05/06/19 1100 05/08/19 0010  LIPASE 217* 154*   No results for input(s): AMMONIA in the last 168 hours. Coagulation Profile: No results for input(s): INR, PROTIME in the last 168 hours. Cardiac Enzymes: No results for input(s): CKTOTAL, CKMB, CKMBINDEX, TROPONINI in the last 168 hours. BNP (last 3 results) No results for input(s): PROBNP in the last 8760 hours. HbA1C: No results for input(s): HGBA1C in the last 72 hours. CBG: Recent Labs  Lab 05/10/19 0529  05/10/19 0736 05/10/19 1221 05/10/19 1628 05/11/19 0737  GLUCAP 75 76 124* 118* 153*   Lipid Profile: No results for input(s): CHOL, HDL, LDLCALC, TRIG, CHOLHDL, LDLDIRECT in the last 72 hours. Thyroid Function Tests: No results for input(s): TSH, T4TOTAL, FREET4, T3FREE, THYROIDAB in the last 72 hours. Anemia Panel: No results for input(s): VITAMINB12, FOLATE, FERRITIN, TIBC, IRON, RETICCTPCT in the last 72 hours. Sepsis Labs: No results for input(s): PROCALCITON, LATICACIDVEN in the last 168 hours.  Recent Results (from the past 240 hour(s))  SARS Coronavirus 2 by RT PCR (hospital order, performed in [facility] hospital lab) Nasopharyngeal Nasopharyngeal Swab     Status: None   Collection Time: 05/08/19 11:52 AM   Specimen: Nasopharyngeal Swab  Result Value Ref Range Status   SARS Coronavirus 2 NEGATIVE NEGATIVE Final    Comment: (NOTE) If result is NEGATIVE SARS-CoV-2 target nucleic acids are NOT DETECTED. The SARS-CoV-2 RNA is generally detectable in upper and lower  respiratory specimens during the acute phase of infection. The lowest  concentration of SARS-CoV-2 viral copies this assay can detect is 250  copies / mL. A negative result does not  preclude SARS-CoV-2 infection  and should not be used as the sole basis for treatment or other  patient management decisions.  A negative result may occur with  improper specimen collection / handling, submission of specimen other  than nasopharyngeal swab, presence of viral mutation(s) within the  areas targeted by this assay, and inadequate number of viral copies  (<250 copies / mL). A negative result must be combined with clinical  observations, patient history, and epidemiological information. If result is POSITIVE SARS-CoV-2 target nucleic acids are DETECTED. The SARS-CoV-2 RNA is generally detectable in upper and lower  respiratory specimens dur ing the acute phase of infection.  Positive  results are indicative of active infection with SARS-CoV-2.  Clinical  correlation with patient history and other diagnostic information is  necessary to determine patient infection status.  Positive results do  not rule out bacterial infection or co-infection with other viruses. If result is PRESUMPTIVE POSTIVE SARS-CoV-2 nucleic acids MAY BE PRESENT.   A presumptive positive result was obtained on the submitted specimen  and confirmed on repeat testing.  While 2019 novel coronavirus  (SARS-CoV-2) nucleic acids may be present in the submitted sample  additional confirmatory testing may be necessary for epidemiological  and / or clinical management purposes  to differentiate between  SARS-CoV-2 and other Sarbecovirus currently known to infect humans.  If clinically indicated additional testing with an alternate test  methodology [phone]) is advised. The SARS-CoV-2 RNA is generally  detectable in upper and lower respiratory sp ecimens during the acute  phase of infection. The expected result is Negative. Fact Sheet for Patients:  StrictlyIdeas.no Fact Sheet for Healthcare Providers: BankingDealers.co.za This test is not yet approved or cleared by  the [place] FDA and has been authorized for detection and/or diagnosis of SARS-CoV-2 by FDA under an Emergency Use Authorization (EUA).  This EUA will remain in effect (meaning this test can be used) for the duration of the COVID-19 declaration under Section 564(b)(1) of the Act, 21 U.S.C. section 360bbb-3(b)(1), unless the authorization is terminated or revoked sooner. Performed at [facility], [place] [address], [place], [place] [address]          Radiology Studies: No results found.      Scheduled Meds:  amLODipine  10 mg Oral Daily   apixaban  5 mg Oral BID  Chlorhexidine Gluconate Cloth  6 each Topical Q0600   feeding supplement (NEPRO CARB STEADY)  237 mL Oral TID WC   hydrALAZINE  100 mg Oral TID   HYDROmorphone       insulin aspart  0-9 Units Subcutaneous Q4H   labetalol  100 mg Oral BID   oxyCODONE  5 mg Oral Once   pantoprazole  40 mg Oral Daily   senna-docusate  2 tablet Oral QHS   sodium chloride flush  3 mL Intravenous Q12H   Continuous Infusions:  sodium chloride     sodium chloride     sodium thiosulfate infusion for calciphylaxis       LOS: 2 days    Time spent: 35 min    Nicolette Bang, MD [facility]  If 7PM-7AM, please contact night-coverage  05/11/2019, 9:01 AM

## 2019-05-12 LAB — GLUCOSE, CAPILLARY
Glucose-Capillary: 82 mg/dL (ref 70–99)
Glucose-Capillary: 86 mg/dL (ref 70–99)

## 2019-05-12 MED ORDER — CHLORHEXIDINE GLUCONATE CLOTH 2 % EX PADS: 6.0000 | MEDICATED_PAD | Freq: Every day | CUTANEOUS | Status: DC

## 2019-05-12 MED ORDER — LABETALOL HCL 300 MG PO TABS: 150.0000 mg | ORAL_TABLET | Freq: Two times a day (BID) | ORAL | Status: DC

## 2019-05-12 NOTE — Discharge Summary (Signed)
Physician Discharge Summary  JACOBS GOLAB YNW:[id number] DOB: 1966/07/12 DOA: 05/07/2019  PCP: Patient, [name]  Admit date: 05/07/2019 Discharge date: 05/12/2019  Admitted From: Home Disposition:  Home  Recommendations for Outpatient Follow-up:  1. Follow up with PCP in 1-2 weeks 2. Follow up with scheduled dialysis  Discharge Condition:Improved CODE STATUS:Full Diet recommendation: Diabetic   Brief/Interim Summary: 53 y.o.male,wh/o DVT/ PE,hypertension, hyperlipidemia, dm2, ESRD on HD (TTS), CHF (diastolic), h/o cirrhosis, chronic pancreatitis w pseudocyst presents with c/o acute on chronic abdominal pain. He missed his dialysis on Thursday and was only was able to complete 1.5 hours of dialysis on Saturday due to his abdominal pain.   Discharge Diagnoses:  Principal Problem:   Hyperkalemia Active Problems:   DM (diabetes mellitus), type 2, uncontrolled, with renal complications (HCC)   Essential hypertension   Recurrent abdominal pain   End-stage renal disease on hemodialysis (HCC)   Chronic pancreatitis (HCC)   Pancreatitis, acute   Acute on chronic abdominal pain  Secondary to chronic pancreatitis Advancing diet but poorly toelratedsecondary to continued pain IV narcotics weaned to q6hr dosing, pt tolerated well Pt able to tolerate regular diet  Hyperkalemia calclium gluconate 1gm iv x1on admission Sodium bicarbonate 1 amp iv x1on admission [place] consult Nephrology consultedappreciated. Continue with HD as scheduled  ESRD on HD TTS Nephrology consulted by ED, appreciate input  Hypertension Cont Hydralazine 146m po tid Cont Amlodipine 549mpo qday Hydralazine 1029mv q6h prn On ACEI, would follow lytes very closely  Dm2 fsbs q4h, ISS  Gerd Cont PPI  Chronic nausea zofran as above, if not effective phenergan 6.49m43m q6h prn   H/o PE Cont Eliquis as tolerated   Discharge Instructions   Allergies as of 05/12/2019       Reactions   Butalbital-apap-caffeine Shortness Of Breath, Swelling, Other (See Comments)   Swelling in throat   Ferrlecit [na Ferric Gluc Cplx In Sucrose] Shortness Of Breath, Swelling, Other (See Comments)   Swelling in throat, tolerates Venofor   Minoxidil Shortness Of Breath   Tylenol [acetaminophen] Anaphylaxis, Swelling   Darvocet [propoxyphene N-acetaminophen] Hives      Medication List    TAKE these medications   amLODipine 5 MG tablet Commonly known as: NORVASC Take 1 tablet (5 mg total) by mouth daily.   apixaban 5 MG Tabs tablet Commonly known as: Eliquis Take 1 tablet (5 mg total) by mouth 2 (two) times daily.   cyclobenzaprine 10 MG tablet Commonly known as: FLEXERIL Take 10 mg by mouth 3 (three) times daily.   diphenhydrAMINE 25 mg capsule Commonly known as: BENADRYL Take 25 mg by mouth every 8 (eight) hours as needed for itching.   hydrALAZINE 100 MG tablet Commonly known as: APRESOLINE Take 1 tablet (100 mg total) by mouth 3 (three) times daily.   Linzess 72 MCG capsule Generic drug: linaclotide Take 144 mcg by mouth daily before breakfast. Rx  Sent for Prior Authorization request to Dr. Ins,Ricard Dillonuld only pay for 1 daily.   lisinopril 5 MG tablet Commonly known as: ZESTRIL Take 5 mg by mouth daily.   Nephro Vitamins 0.8 MG Tabs Take 1 tablet by mouth daily.   nitroGLYCERIN 0.4 MG SL tablet Commonly known as: NITROSTAT Place 1 tablet (0.4 mg total) under the tongue every 5 (five) minutes as needed for chest pain.   omeprazole 20 MG capsule Commonly known as: PRILOSEC Take 20 mg by mouth daily.   ondansetron 4 MG disintegrating tablet Commonly known as: Zofran  ODT Take 1 tablet (4 mg total) by mouth every 8 (eight) hours as needed for nausea or vomiting.   oxyCODONE 15 MG immediate release tablet Commonly known as: ROXICODONE Take 15 mg by mouth every 4 (four) hours as needed for pain.   promethazine 12.5 MG tablet Commonly known as:  PHENERGAN Take 1 tablet (12.5 mg total) by mouth every 6 (six) hours as needed for up to 10 doses for nausea or vomiting.   senna-docusate 8.6-50 MG tablet Commonly known as: Senokot-S Take 2 tablets by mouth at bedtime.      Follow-up Information    Lorretta Harp, MD. Schedule an appointment as soon as possible for a visit in 2 week(s).   Specialties: Cardiology, Radiology Contact information: [address] [place] [address] [place] [address] [phone]          Allergies  Allergen Reactions  . Butalbital-Apap-Caffeine Shortness Of Breath, Swelling and Other (See Comments)    Swelling in throat  . Ferrlecit [Na Ferric Gluc Cplx In Sucrose] Shortness Of Breath, Swelling and Other (See Comments)    Swelling in throat, tolerates Venofor  . Minoxidil Shortness Of Breath  . Tylenol [Acetaminophen] Anaphylaxis and Swelling  . Darvocet [Propoxyphene N-Acetaminophen] Hives    Consultations:  Nephrology  Procedures/Studies: Ct Abdomen Pelvis Wo Contrast  Result Date: 05/06/2019 CLINICAL DATA:  Increased abdominal distention. End-stage renal disease. Failed kidney transplant. Chronic pancreatitis. EXAM: CT ABDOMEN AND PELVIS WITHOUT CONTRAST TECHNIQUE: Multidetector CT imaging of the abdomen and pelvis was performed following the standard protocol without IV contrast. COMPARISON:  04/20/2019 FINDINGS: Lower chest: No acute findings. Stable right basilar pleural-parenchymal scarring with associated rounded atelectasis. Stable cardiomegaly and small pericardial effusion. Hepatobiliary: No mass visualized on this unenhanced exam. Gallbladder is unremarkable. No evidence of biliary ductal dilatation. Pancreas: No mass or inflammatory process visualized on this unenhanced exam. Spleen:  Within normal limits in size. Adrenals/Urinary tract: Normal adrenal glands. Diffuse atrophy of native kidneys again seen, consistent with end-stage renal disease. 3 cm high attenuation left renal  lesion is stable, consistent with hemorrhagic cyst. Failed renal transplant with calcification is again seen in the left iliac fossa. Stomach/Bowel: No evidence of obstruction, inflammatory process, or abnormal fluid collections. Vascular/Lymphatic: No pathologically enlarged lymph nodes identified. No evidence of abdominal aortic aneurysm. Aortic atherosclerosis. Reproductive:  No mass or other significant abnormality. Other:  None. Musculoskeletal:  No suspicious bone lesions identified. IMPRESSION: No acute findings within the abdomen or pelvis. Stable appearance of failed, partially calcified renal transplant in left iliac fossa. Severe atrophy of native kidneys, consistent with end-stage renal disease. No evidence of hydronephrosis. Aortic Atherosclerosis (ICD10-I70.0). Electronically Signed   By: Marlaine Hind M.D.   On: 05/06/2019 13:05   Ct Abdomen Pelvis Wo Contrast  Result Date: 04/20/2019 CLINICAL DATA:  53 year old male with acute abdominal pain, nausea and vomiting. Patient with end-stage renal disease on dialysis. EXAM: CT ABDOMEN AND PELVIS WITHOUT CONTRAST TECHNIQUE: Multidetector CT imaging of the abdomen and pelvis was performed following the standard protocol without IV contrast. COMPARISON:  06/22/2018 CT FINDINGS: Please note that parenchymal abnormalities may be missed without intravenous contrast. Lower chest: Moderate to marked cardiomegaly and small to moderate pericardial effusion again noted. Heavy coronary artery and aortic atherosclerotic calcifications noted. Small loculated RIGHT pleural effusion with moderate bibasilar atelectasis noted. Hepatobiliary: The liver and gallbladder are unremarkable. No biliary dilatation. Pancreas: Unremarkable Spleen: Unremarkable Adrenals/Urinary Tract: Atrophic kidneys again noted. A 2.7 x 3 cm slightly hyperdense mass within the  LEFT kidney is unchanged from the prior study and decreased in size since 2018. A dystrophic failed LEFT pelvic renal  transplant again noted. The adrenal glands and visualized bladder are unremarkable. Stomach/Bowel: Stomach is within normal limits. No evidence of bowel wall thickening, distention, or inflammatory changes. Vascular/Lymphatic: Heavy aortic atherosclerosis and other vascular calcifications identified. No enlarged abdominal or pelvic lymph nodes. Reproductive: Prostate is unremarkable. Other: No ascites, focal collection or pneumoperitoneum. Musculoskeletal: No acute or suspicious bony abnormalities identified. IMPRESSION: 1. No evidence of acute abnormality. 2. Moderate to marked cardiomegaly and unchanged small to moderate pericardial effusion 3. Atrophic kidneys with unchanged LEFT renal lesion. 4. Small loculated RIGHT pleural effusion and moderate bibasilar atelectasis. 5. Coronary artery and Aortic Atherosclerosis (ICD10-I70.0). Electronically Signed   By: Margarette Canada M.D.   On: 04/20/2019 19:03   Dg Chest 2 View  Result Date: 04/17/2019 CLINICAL DATA:  Chest pain. EXAM: CHEST - 2 VIEW COMPARISON:  Most recent comparison 5 days ago 04/12/2019 FINDINGS: Chronic cardiomegaly, unchanged. Unchanged mediastinal contours. Chronic partially loculated right pleural effusion with associated basilar opacity. Vascular congestion and suggestion of mild pulmonary edema, also unchanged. Trace left pleural effusion. No new airspace disease or pneumothorax. Unchanged osseous structures. IMPRESSION: 1. Unchanged radiographic appearance of the chest over the past 5 days. 2. Chronic cardiomegaly with pulmonary edema. Bilateral pleural effusions, partially loculated on the right with associated basilar opacity. Electronically Signed   By: Keith Rake M.D.   On: 04/17/2019 00:35   Dg Chest Portable 1 View  Result Date: 05/06/2019 CLINICAL DATA:  Abdominal pain and vomiting EXAM: PORTABLE CHEST 1 VIEW COMPARISON:  04/17/2019, CT 04/20/2019 FINDINGS: Enlarged cardiac silhouette. Loculated pleural fluid andatelectasis  seen at the RIGHT lung base as described on CT 04/20/2019. Mild central venous pulmonary congestion. No focal consolidation. No pneumothorax. IMPRESSION: 1. No interval change. 2. Marked cardiomegaly. 3. RIGHT pleural effusion with basilar loculation and atelectasis. Electronically Signed   By: Suzy Bouchard M.D.   On: 05/06/2019 09:04   Dg Abdomen Acute W/chest  Result Date: 04/19/2019 CLINICAL DATA:  Abdominal pain and vomiting. Symptoms for 3 days. EXAM: DG ABDOMEN ACUTE W/ 1V CHEST COMPARISON:  Radiograph 11/15/2018. CT 06/22/2018 FINDINGS: Chronic cardiomegaly. Unchanged mediastinal contours. Chronic loculated pleuroparenchymal opacity at the right lung base and laterally, decreased pleural effusion component from prior exam. Pulmonary edema with mild progression. Small left pleural effusion. No free intra-abdominal air. Few prominent air-filled small bowel loops in the left abdomen without obstruction. Vascular calcifications and partially calcified renal transplant in the left lower quadrant. No acute osseous abnormalities are seen. IMPRESSION: 1. Few prominent air-filled small bowel loops in the left abdomen without evidence obstruction. No free air. 2. Chronic loculated pleuroparenchymal opacity at the right lung base, decreased pleural fluid from prior exam. 3. Chronic cardiomegaly.  Pulmonary edema has mild progressed. Electronically Signed   By: Keith Rake M.D.   On: 04/19/2019 03:07     Subjective: Questioning about going home today  Discharge Exam: Vitals:   05/12/19 0644 05/12/19 1038  BP: (!) 154/69 (!) 179/78  Pulse: 70 72  Resp: 16 16  Temp: 97.8 F (36.6 C) 97.7 F (36.5 C)  SpO2: 96% 99%   Vitals:   05/11/19 1829 05/11/19 2156 05/12/19 0644 05/12/19 1038  BP: (!) 149/91 (!) 195/76 (!) 154/69 (!) 179/78  Pulse: 71 80 70 72  Resp: _0 Temp:  98.6 F (37 C) 97.8 F (36.6 C) 97.7 F (36.5 C)  TempSrc:  Oral Oral Oral  SpO2: 100% 98% 96% 99%  Weight:    72.9 kg   Height:        General: Pt is alert, awake, not in acute distress Cardiovascular: RRR, S1/S2 +, no rubs, no gallops Respiratory: CTA bilaterally, no wheezing, no rhonchi Abdominal: Soft, NT, ND, bowel sounds + Extremities: no edema, no cyanosis   The results of significant diagnostics from this hospitalization (including imaging, microbiology, ancillary and laboratory) are listed below for reference.     Microbiology: Recent Results (from the past 240 hour(s))  SARS Coronavirus 2 by RT PCR (hospital order, performed in [facility] hospital lab) Nasopharyngeal Nasopharyngeal Swab     Status: None   Collection Time: 05/08/19 11:52 AM   Specimen: Nasopharyngeal Swab  Result Value Ref Range Status   SARS Coronavirus 2 NEGATIVE NEGATIVE Final    Comment: (NOTE) If result is NEGATIVE SARS-CoV-2 target nucleic acids are NOT DETECTED. The SARS-CoV-2 RNA is generally detectable in upper and lower  respiratory specimens during the acute phase of infection. The lowest  concentration of SARS-CoV-2 viral copies this assay can detect is 250  copies / mL. A negative result does not preclude SARS-CoV-2 infection  and should not be used as the sole basis for treatment or other  patient management decisions.  A negative result may occur with  improper specimen collection / handling, submission of specimen other  than nasopharyngeal swab, presence of viral mutation(s) within the  areas targeted by this assay, and inadequate number of viral copies  (<250 copies / mL). A negative result must be combined with clinical  observations, patient history, and epidemiological information. If result is POSITIVE SARS-CoV-2 target nucleic acids are DETECTED. The SARS-CoV-2 RNA is generally detectable in upper and lower  respiratory specimens dur ing the acute phase of infection.  Positive  results are indicative of active infection with SARS-CoV-2.  Clinical  correlation with patient history and  other diagnostic information is  necessary to determine patient infection status.  Positive results do  not rule out bacterial infection or co-infection with other viruses. If result is PRESUMPTIVE POSTIVE SARS-CoV-2 nucleic acids MAY BE PRESENT.   A presumptive positive result was obtained on the submitted specimen  and confirmed on repeat testing.  While 2019 novel coronavirus  (SARS-CoV-2) nucleic acids may be present in the submitted sample  additional confirmatory testing may be necessary for epidemiological  and / or clinical management purposes  to differentiate between  SARS-CoV-2 and other Sarbecovirus currently known to infect humans.  If clinically indicated additional testing with an alternate test  methodology [phone]) is advised. The SARS-CoV-2 RNA is generally  detectable in upper and lower respiratory sp ecimens during the acute  phase of infection. The expected result is Negative. Fact Sheet for Patients:  StrictlyIdeas.no Fact Sheet for Healthcare Providers: BankingDealers.co.za This test is not yet approved or cleared by the [place] FDA and has been authorized for detection and/or diagnosis of SARS-CoV-2 by FDA under an Emergency Use Authorization (EUA).  This EUA will remain in effect (meaning this test can be used) for the duration of the COVID-19 declaration under Section 564(b)(1) of the Act, 21 U.S.C. section 360bbb-3(b)(1), unless the authorization is terminated or revoked sooner. Performed at [facility], [place] [address], [place], [place] [address]      Labs: BNP (last 3 results) No results for input(s): BNP in the last 8760 hours. Basic Metabolic Panel: Recent Labs  Lab 05/06/19 1100  05/08/19 0747 05/08/19 2216 05/11/19 1145  NA 140 140  --  139  K 5.1 6.4* 5.9* 4.3  CL 97* 98  --  99  CO2 24 22  --  24  GLUCOSE 71 66*  --  87  BUN 56* 85*  --  57*  CREATININE 12.11*  15.10*  --  14.66*  CALCIUM 8.2* 8.1*  --  7.8*   Liver Function Tests: Recent Labs  Lab 05/06/19 1100 05/08/19 0747 05/11/19 1145  AST 14* 12* 11*  ALT _0 ALKPHOS 186* 168* 155*  BILITOT 0.7 0.8 1.2  PROT 8.0 7.5 6.9  ALBUMIN 3.3* 3.4* 2.9*   Recent Labs  Lab 05/06/19 1100 05/08/19 0010 05/11/19 1145  LIPASE 217* 154* 27   No results for input(s): AMMONIA in the last 168 hours. CBC: Recent Labs  Lab 05/06/19 1100 05/08/19 0747 05/11/19 1504  WBC 4.5 4.6 4.4  NEUTROABS 3.0 3.0  --   HGB 10.9* 9.6* 9.9*  HCT 34.3* 30.5* 30.6*  MCV 90.5 89.7 88.7  PLT 132* 150 158   Cardiac Enzymes: No results for input(s): CKTOTAL, CKMB, CKMBINDEX, TROPONINI in the last 168 hours. BNP: Invalid input(s): POCBNP CBG: Recent Labs  Lab 05/10/19 1628 05/11/19 0737 05/11/19 1103 05/11/19 1615 05/12/19 0644  GLUCAP 118* 153* 90 105* 82   D-Dimer No results for input(s): DDIMER in the last 72 hours. Hgb A1c No results for input(s): HGBA1C in the last 72 hours. Lipid Profile No results for input(s): CHOL, HDL, LDLCALC, TRIG, CHOLHDL, LDLDIRECT in the last 72 hours. Thyroid function studies No results for input(s): TSH, T4TOTAL, T3FREE, THYROIDAB in the last 72 hours.  Invalid input(s): FREET3 Anemia work up No results for input(s): VITAMINB12, FOLATE, FERRITIN, TIBC, IRON, RETICCTPCT in the last 72 hours. Urinalysis    Component Value Date/Time   COLORURINE YELLOW 10/18/2013 [place] 10/18/2013 0419   LABSPEC 1.008 10/18/2013 0419   PHURINE 8.5 (H) 10/18/2013 0419   GLUCOSEU 100 (A) 10/18/2013 0419   HGBUR TRACE (A) 10/18/2013 0419   BILIRUBINUR NEGATIVE 10/18/2013 0419   KETONESUR NEGATIVE 10/18/2013 0419   PROTEINUR 100 (A) 10/18/2013 0419   UROBILINOGEN 0.2 10/18/2013 0419   NITRITE NEGATIVE 10/18/2013 0419   LEUKOCYTESUR NEGATIVE 10/18/2013 0419   Sepsis Labs Invalid input(s): PROCALCITONIN,  WBC,  LACTICIDVEN Microbiology Recent  Results (from the past 240 hour(s))  SARS Coronavirus 2 by RT PCR (hospital order, performed in [facility] hospital lab) Nasopharyngeal Nasopharyngeal Swab     Status: None   Collection Time: 05/08/19 11:52 AM   Specimen: Nasopharyngeal Swab  Result Value Ref Range Status   SARS Coronavirus 2 NEGATIVE NEGATIVE Final    Comment: (NOTE) If result is NEGATIVE SARS-CoV-2 target nucleic acids are NOT DETECTED. The SARS-CoV-2 RNA is generally detectable in upper and lower  respiratory specimens during the acute phase of infection. The lowest  concentration of SARS-CoV-2 viral copies this assay can detect is 250  copies / mL. A negative result does not preclude SARS-CoV-2 infection  and should not be used as the sole basis for treatment or other  patient management decisions.  A negative result may occur with  improper specimen collection / handling, submission of specimen other  than nasopharyngeal swab, presence of viral mutation(s) within the  areas targeted by this assay, and inadequate number of viral copies  (<250 copies / mL). A negative result must be combined with clinical  observations, patient history, and epidemiological information.  If result is POSITIVE SARS-CoV-2 target nucleic acids are DETECTED. The SARS-CoV-2 RNA is generally detectable in upper and lower  respiratory specimens dur ing the acute phase of infection.  Positive  results are indicative of active infection with SARS-CoV-2.  Clinical  correlation with patient history and other diagnostic information is  necessary to determine patient infection status.  Positive results do  not rule out bacterial infection or co-infection with other viruses. If result is PRESUMPTIVE POSTIVE SARS-CoV-2 nucleic acids MAY BE PRESENT.   A presumptive positive result was obtained on the submitted specimen  and confirmed on repeat testing.  While 2019 novel coronavirus  (SARS-CoV-2) nucleic acids may be present in the submitted sample   additional confirmatory testing may be necessary for epidemiological  and / or clinical management purposes  to differentiate between  SARS-CoV-2 and other Sarbecovirus currently known to infect humans.  If clinically indicated additional testing with an alternate test  methodology [phone]) is advised. The SARS-CoV-2 RNA is generally  detectable in upper and lower respiratory sp ecimens during the acute  phase of infection. The expected result is Negative. Fact Sheet for Patients:  StrictlyIdeas.no Fact Sheet for Healthcare Providers: BankingDealers.co.za This test is not yet approved or cleared by the [place] FDA and has been authorized for detection and/or diagnosis of SARS-CoV-2 by FDA under an Emergency Use Authorization (EUA).  This EUA will remain in effect (meaning this test can be used) for the duration of the COVID-19 declaration under Section 564(b)(1) of the Act, 21 U.S.C. section 360bbb-3(b)(1), unless the authorization is terminated or revoked sooner. Performed at [facility], [place] [address], [place], [place] [address]    Time spent: 30 min  SIGNED:   Marylu Lund, MD  [facility] 05/12/2019, 11:27 AM  If 7PM-7AM, please contact night-coverage

## 2019-05-12 NOTE — Progress Notes (Signed)
DISCHARGE NOTE HOME Frank [name] to be discharged Home per MD order. Discussed prescriptions and follow up appointments with the patient. Prescriptions given to patient; medication list explained in detail. Patient verbalized understanding.  Skin clean, dry and intact without evidence of skin break down, no evidence of skin tears noted. IV catheter discontinued intact. Site without signs and symptoms of complications. Dressing and pressure applied. Pt denies pain at the site currently. No complaints noted.  Patient free of lines, drains, and wounds.   An After Visit Summary (AVS) was printed and given to the patient. Patient walked off unit and discharged home via private auto.  Paulla Fore, RN

## 2019-05-12 NOTE — Consult Note (Signed)
   [facility] Inpatient Consult   05/12/2019  Frank [name] Feb 10, 1966 [id number]    Patient wasscreenedfor [place] Management servicesneeds underhis Medicare Next/Genplan.Patient has 86% extreme high risk score for unplanned readmission, with 4 hospitalizations and high ED utilization (x 18) in the past 6 months.  Chart review and MD brief narrative on 05/11/19 show as follows: [name] a53 y.o.male,wh/o DVT/ PE, hypertension, hyperlipidemia, dm2, ESRD on HD (TTS), CHF (diastolic), h/o cirrhosis, chronic pancreatitis w/ pseudocyst,   presents with c/o acute on chronic abdominal pain. He missed his dialysis on Thursday and was only able to complete 1.5 hours of dialysis on Saturday due to his abdominal pain. Patient was admitted for acute on chronic abdominal pain likely secondary to acute pancreatitis as well as hyperkalemia, and need for dialysis.  Review of medical record shows that patient has no listed primary care provider ([facility]). Membership roster listed Dr. Durenda Age (with [facility]) as his primary care provider. [facility] and spoke to Parker who states that "patient is not seen in the practice".   Called and spoke with patient briefly and hurriedly. Was able to ask who his primary care provider, and he states, he "does not have one" and is "not seeing anyone".  Patient is currently NOT a beneficiary of the attributed [place] in the [employer] and he is currently Noteligible for [place].  Reason:He has NO current primary care provider.   Will sign off.   For questions, please contact:  Edwena Felty A. Jolane Bankhead, BSN, RN-BC [facility] Liaison Cell: [phone]

## 2019-05-12 NOTE — Progress Notes (Addendum)
[facility] Progress Note   Subjective:   Patient seen and examined at bedside in room.  Tolerated part of his dinner and breakfast this AM.  Admits to nausea but no vomiting.  Reports pain is 7/10.  No other complaints.   Objective Vitals:   05/11/19 1829 05/11/19 2156 05/12/19 0644 05/12/19 1038  BP: (!) 149/91 (!) 195/76 (!) 154/69 (!) 179/78  Pulse: 71 80 70 72  Resp: _0 Temp:  98.6 F (37 C) 97.8 F (36.6 C) 97.7 F (36.5 C)  TempSrc:  Oral Oral Oral  SpO2: 100% 98% 96% 99%  Weight:   72.9 kg   Height:       Physical Exam General:NAD, well appearing male, sitting in bed Heart:RRR Lungs:CTAB Abdomen:soft, +BS Extremities: no LE edema Dialysis Access: LU AVF +b   Filed Weights   05/11/19 1134 05/11/19 1546 05/12/19 0644  Weight: 76.5 kg 72.2 kg 72.9 kg    Intake/Output Summary (Last 24 hours) at 05/12/2019 1101 Last data filed at 05/12/2019 0600 Gross per 24 hour  Intake 1114 ml  Output 3639 ml  Net -2525 ml    Additional Objective Labs: Basic Metabolic Panel: Recent Labs  Lab 05/06/19 1100 05/08/19 0747 05/08/19 2216 05/11/19 1145  NA 140 140  --  139  K 5.1 6.4* 5.9* 4.3  CL 97* 98  --  99  CO2 24 22  --  24  GLUCOSE 71 66*  --  87  BUN 56* 85*  --  57*  CREATININE 12.11* 15.10*  --  14.66*  CALCIUM 8.2* 8.1*  --  7.8*   Liver Function Tests: Recent Labs  Lab 05/06/19 1100 05/08/19 0747 05/11/19 1145  AST 14* 12* 11*  ALT _1 ALKPHOS 186* 168* 155*  BILITOT 0.7 0.8 1.2  PROT 8.0 7.5 6.9  ALBUMIN 3.3* 3.4* 2.9*   Recent Labs  Lab 05/06/19 1100 05/08/19 0010 05/11/19 1145  LIPASE 217* 154* 27   CBC: Recent Labs  Lab 05/06/19 1100 05/08/19 0747 05/11/19 1504  WBC 4.5 4.6 4.4  NEUTROABS 3.0 3.0  --   HGB 10.9* 9.6* 9.9*  HCT 34.3* 30.5* 30.6*  MCV 90.5 89.7 88.7  PLT 132* 150 158   CBG: Recent Labs  Lab 05/10/19 1628 05/11/19 0737 05/11/19 1103 05/11/19 1615 05/12/19 0644  GLUCAP 118*  153* 90 105* 82    Medications: . sodium chloride    . sodium chloride    . sodium thiosulfate infusion for calciphylaxis 25 g (05/11/19 1428)   . amLODipine  10 mg Oral Daily  . apixaban  5 mg Oral BID  . Chlorhexidine Gluconate Cloth  6 each Topical Q0600  . feeding supplement (NEPRO CARB STEADY)  237 mL Oral TID WC  . hydrALAZINE  100 mg Oral TID  . insulin aspart  0-9 Units Subcutaneous TID AC & HS  . labetalol  100 mg Oral BID  . oxyCODONE  5 mg Oral Once  . pantoprazole  40 mg Oral Daily  . senna-docusate  2 tablet Oral QHS  . sodium chloride flush  3 mL Intravenous Q12H    Dialysis Orders: [facility] 400/800 72.5kg 2/2.25 bath AVF No heparin - parsabiv 5 mg tiw - mircera 200 ug every 2 wks, last10/13 - sod thiosulfate 25 gm tiw IV  Home meds: - amlodipine 5 qd/ hydralazine 100 tid/ lisinopril 5 qd - omeprazole 40 qd - apixaban 5 bid - sl ntg prn -  prn's/ vitamins/ supplements  Assessment/Plan: 1.Acute/chronic abdominal pain/ ^ lipase - c/w pancreatitis - Per primary 2. Hyperkalemia - improved post HD. Lokelma given. K 4.3. 2. ESRD -on HD TTS. Next HD tomorrow, orders written. Sodium thiosulfate w/HD. 3. Anemia of CKD- Hgb 9.9. ESA recently dosed.   4. Secondary hyperparathyroidism -Ca at goal. Checking phos.  Continue binders once diet advanced - Fosrenol 1AC TID. No parsabiv b/c not on formulary. 5. HTN/volume -BP still elevated. Continue home meds, amlodipine 67m qd and hydralazine 1035mTID,  ^labetalol 15026mID. Lisinopril d/c on admit because hyperkalemia. Slightly under dry post HD. Does not appear volume overloaded, titrate down as tolerated.  6. Nutrition -Renal diet w/fluid restrictions.  7. Hx DVT/PE - on eliquis    LinJen MowA-C [facility] Pager: [phone] 9255/21/2020,11:01 AM  LOS: 3 days   Pt seen, examined and agree w A/P as above. Per primary MD pt wanting to go home, prob dc home today.   RobKelly SplinterD 05/12/2019, 12:05 PM

## 2019-05-12 NOTE — Care Management Important Message (Signed)
Important Message  Patient Details  Name: Frank [name] MRN: [id number] Date of Birth: 09-Sep-1965   Medicare Important Message Given:  Yes     Kresha Abelson 05/12/2019, 2:10 PM

## 2019-05-14 ENCOUNTER — Telehealth: Payer: Self-pay | Admitting: Nephrology

## 2019-05-14 ENCOUNTER — Encounter (HOSPITAL_COMMUNITY): Payer: Self-pay

## 2019-05-14 ENCOUNTER — Other Ambulatory Visit: Payer: Self-pay

## 2019-05-14 DIAGNOSIS — Z87891 Personal history of nicotine dependence: Secondary | ICD-10-CM | POA: Insufficient documentation

## 2019-05-14 DIAGNOSIS — I5022 Chronic systolic (congestive) heart failure: Secondary | ICD-10-CM | POA: Insufficient documentation

## 2019-05-14 DIAGNOSIS — Z20828 Contact with and (suspected) exposure to other viral communicable diseases: Secondary | ICD-10-CM | POA: Diagnosis not present

## 2019-05-14 DIAGNOSIS — Z79899 Other long term (current) drug therapy: Secondary | ICD-10-CM | POA: Insufficient documentation

## 2019-05-14 DIAGNOSIS — I5043 Acute on chronic combined systolic (congestive) and diastolic (congestive) heart failure: Secondary | ICD-10-CM | POA: Diagnosis not present

## 2019-05-14 DIAGNOSIS — K859 Acute pancreatitis without necrosis or infection, unspecified: Secondary | ICD-10-CM | POA: Diagnosis not present

## 2019-05-14 DIAGNOSIS — K861 Other chronic pancreatitis: Secondary | ICD-10-CM | POA: Diagnosis not present

## 2019-05-14 DIAGNOSIS — E875 Hyperkalemia: Secondary | ICD-10-CM | POA: Diagnosis not present

## 2019-05-14 DIAGNOSIS — R1013 Epigastric pain: Secondary | ICD-10-CM | POA: Insufficient documentation

## 2019-05-14 DIAGNOSIS — I82409 Acute embolism and thrombosis of unspecified deep veins of unspecified lower extremity: Secondary | ICD-10-CM | POA: Insufficient documentation

## 2019-05-14 DIAGNOSIS — E1122 Type 2 diabetes mellitus with diabetic chronic kidney disease: Secondary | ICD-10-CM | POA: Insufficient documentation

## 2019-05-14 DIAGNOSIS — R0602 Shortness of breath: Secondary | ICD-10-CM | POA: Diagnosis not present

## 2019-05-14 DIAGNOSIS — N186 End stage renal disease: Secondary | ICD-10-CM | POA: Insufficient documentation

## 2019-05-14 DIAGNOSIS — T8612 Kidney transplant failure: Secondary | ICD-10-CM | POA: Diagnosis not present

## 2019-05-14 DIAGNOSIS — I120 Hypertensive chronic kidney disease with stage 5 chronic kidney disease or end stage renal disease: Secondary | ICD-10-CM | POA: Diagnosis not present

## 2019-05-14 DIAGNOSIS — I132 Hypertensive heart and chronic kidney disease with heart failure and with stage 5 chronic kidney disease, or end stage renal disease: Secondary | ICD-10-CM | POA: Insufficient documentation

## 2019-05-14 DIAGNOSIS — Z7901 Long term (current) use of anticoagulants: Secondary | ICD-10-CM | POA: Insufficient documentation

## 2019-05-14 DIAGNOSIS — Z992 Dependence on renal dialysis: Secondary | ICD-10-CM | POA: Insufficient documentation

## 2019-05-14 NOTE — ED Triage Notes (Signed)
Pt coming from home c/o pancreatitis. N/v

## 2019-05-14 NOTE — Telephone Encounter (Signed)
Transition of care contact from inpatient facility  Date of Discharge: 05/12/2019 Date of contact: 05/14/2019 Method of contact: phone  Attempted to contact patient to discuss transition of care from inpatient admission. Patient did not answer the phone.  Unable to leave voicemail because call was unable to be completed.   Jen Mow, PA-C [facility] Pager: [phone]

## 2019-05-15 ENCOUNTER — Emergency Department (HOSPITAL_COMMUNITY)
Admission: EM | Admit: 2019-05-15 | Discharge: 2019-05-15 | Disposition: A | Discharge status: Home / Self Care | Payer: Medicare Other | Source: Home / Self Care | Attending: Emergency Medicine | Admitting: Emergency Medicine

## 2019-05-15 DIAGNOSIS — R1013 Epigastric pain: Secondary | ICD-10-CM

## 2019-05-15 LAB — COMPREHENSIVE METABOLIC PANEL
ALT: 15 U/L (ref 0–44)
AST: 24 U/L (ref 15–41)
Albumin: 4 g/dL (ref 3.5–5.0)
Alkaline Phosphatase: 187 U/L — ABNORMAL HIGH (ref 38–126)
Anion gap: 19 — ABNORMAL HIGH (ref 5–15)
BUN: 73 mg/dL — ABNORMAL HIGH (ref 6–20)
CO2: 22 mmol/L (ref 22–32)
Calcium: 8.4 mg/dL — ABNORMAL LOW (ref 8.9–10.3)
Chloride: 98 mmol/L (ref 98–111)
Creatinine, Ser: 15.06 mg/dL — ABNORMAL HIGH (ref 0.61–1.24)
GFR calc Af Amer: 4 mL/min — ABNORMAL LOW (ref >60)
GFR calc non Af Amer: 3 mL/min — ABNORMAL LOW (ref >60)
Glucose, Bld: 78 mg/dL (ref 70–99)
Potassium: 5.7 mmol/L — ABNORMAL HIGH (ref 3.5–5.1)
Sodium: 139 mmol/L (ref 135–145)
Total Bilirubin: 0.8 mg/dL (ref 0.3–1.2)
Total Protein: 8.9 g/dL — ABNORMAL HIGH (ref 6.5–8.1)

## 2019-05-15 LAB — CBC
HCT: 38.1 % — ABNORMAL LOW (ref 39.0–52.0)
Hemoglobin: 11.5 g/dL — ABNORMAL LOW (ref 13.0–17.0)
MCH: 27.6 pg (ref 26.0–34.0)
MCHC: 30.2 g/dL (ref 30.0–36.0)
MCV: 91.4 fL (ref 80.0–100.0)
Platelets: 164 10*3/uL (ref 150–400)
RBC: 4.17 MIL/uL — ABNORMAL LOW (ref 4.22–5.81)
RDW: 17.6 % — ABNORMAL HIGH (ref 11.5–15.5)
WBC: 5 10*3/uL (ref 4.0–10.5)
nRBC: 0 % (ref 0.0–0.2)

## 2019-05-15 LAB — LIPASE, BLOOD: Lipase: 150 U/L — ABNORMAL HIGH (ref 11–51)

## 2019-05-15 MED ORDER — OXYCODONE HCL 5 MG PO TABS
10.0000 mg | ORAL_TABLET | Freq: Once | ORAL | Status: AC
  Administered 2019-05-15: 10 mg via ORAL
  Filled 2019-05-15: qty 2

## 2019-05-15 MED ORDER — OXYCODONE HCL 10 MG PO TABS: 10.0000 mg | ORAL_TABLET | Freq: Two times a day (BID) | ORAL | 0 refills | Status: DC | PRN

## 2019-05-15 MED ORDER — SODIUM CHLORIDE 0.9% FLUSH
3.0000 mL | Freq: Once | INTRAVENOUS | Status: AC
  Administered 2019-05-15: 3 mL via INTRAVENOUS

## 2019-05-15 MED ORDER — ONDANSETRON HCL 4 MG PO TABS: 4.0000 mg | ORAL_TABLET | Freq: Three times a day (TID) | ORAL | 0 refills | Status: DC | PRN

## 2019-05-15 MED ORDER — HYDROMORPHONE HCL 1 MG/ML IJ SOLN
1.0000 mg | Freq: Once | INTRAMUSCULAR | Status: AC
  Administered 2019-05-15: 1 mg via INTRAVENOUS
  Filled 2019-05-15: qty 1

## 2019-05-15 MED ORDER — ONDANSETRON HCL 4 MG/2ML IJ SOLN
4.0000 mg | Freq: Once | INTRAMUSCULAR | Status: AC
  Administered 2019-05-15: 4 mg via INTRAVENOUS
  Filled 2019-05-15: qty 2
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
## 2019-05-16 ENCOUNTER — Encounter (HOSPITAL_COMMUNITY): Payer: Self-pay | Admitting: Emergency Medicine

## 2019-05-16 ENCOUNTER — Inpatient Hospital Stay (HOSPITAL_COMMUNITY)
Admission: EM | Admit: 2019-05-16 | Discharge: 2019-05-21 | DRG: 438 | Discharge status: Against Medical Advice | Payer: Medicare Other | Attending: Internal Medicine | Admitting: Internal Medicine

## 2019-05-16 ENCOUNTER — Other Ambulatory Visit: Payer: Self-pay

## 2019-05-16 DIAGNOSIS — E875 Hyperkalemia: Secondary | ICD-10-CM | POA: Diagnosis not present

## 2019-05-16 DIAGNOSIS — N186 End stage renal disease: Secondary | ICD-10-CM | POA: Diagnosis present

## 2019-05-16 DIAGNOSIS — I700 Atherosclerosis of aorta: Secondary | ICD-10-CM | POA: Diagnosis present

## 2019-05-16 DIAGNOSIS — K861 Other chronic pancreatitis: Secondary | ICD-10-CM | POA: Diagnosis not present

## 2019-05-16 DIAGNOSIS — IMO0002 Reserved for concepts with insufficient information to code with codable children: Secondary | ICD-10-CM | POA: Diagnosis present

## 2019-05-16 DIAGNOSIS — Z86711 Personal history of pulmonary embolism: Secondary | ICD-10-CM

## 2019-05-16 DIAGNOSIS — I5043 Acute on chronic combined systolic (congestive) and diastolic (congestive) heart failure: Secondary | ICD-10-CM | POA: Diagnosis present

## 2019-05-16 DIAGNOSIS — R109 Unspecified abdominal pain: Secondary | ICD-10-CM | POA: Diagnosis present

## 2019-05-16 DIAGNOSIS — Z9115 Patient's noncompliance with renal dialysis: Secondary | ICD-10-CM

## 2019-05-16 DIAGNOSIS — N19 Unspecified kidney failure: Secondary | ICD-10-CM | POA: Diagnosis present

## 2019-05-16 DIAGNOSIS — E8770 Fluid overload, unspecified: Secondary | ICD-10-CM | POA: Diagnosis present

## 2019-05-16 DIAGNOSIS — T8612 Kidney transplant failure: Secondary | ICD-10-CM | POA: Diagnosis present

## 2019-05-16 DIAGNOSIS — Z79891 Long term (current) use of opiate analgesic: Secondary | ICD-10-CM

## 2019-05-16 DIAGNOSIS — Z86718 Personal history of other venous thrombosis and embolism: Secondary | ICD-10-CM

## 2019-05-16 DIAGNOSIS — D631 Anemia in chronic kidney disease: Secondary | ICD-10-CM | POA: Diagnosis present

## 2019-05-16 DIAGNOSIS — K7460 Unspecified cirrhosis of liver: Secondary | ICD-10-CM | POA: Diagnosis present

## 2019-05-16 DIAGNOSIS — Z7901 Long term (current) use of anticoagulants: Secondary | ICD-10-CM

## 2019-05-16 DIAGNOSIS — E1129 Type 2 diabetes mellitus with other diabetic kidney complication: Secondary | ICD-10-CM | POA: Diagnosis present

## 2019-05-16 DIAGNOSIS — Y830 Surgical operation with transplant of whole organ as the cause of abnormal reaction of the patient, or of later complication, without mention of misadventure at the time of the procedure: Secondary | ICD-10-CM | POA: Diagnosis present

## 2019-05-16 DIAGNOSIS — E1159 Type 2 diabetes mellitus with other circulatory complications: Secondary | ICD-10-CM | POA: Diagnosis present

## 2019-05-16 DIAGNOSIS — Z8249 Family history of ischemic heart disease and other diseases of the circulatory system: Secondary | ICD-10-CM

## 2019-05-16 DIAGNOSIS — Z888 Allergy status to other drugs, medicaments and biological substances status: Secondary | ICD-10-CM

## 2019-05-16 DIAGNOSIS — G894 Chronic pain syndrome: Secondary | ICD-10-CM | POA: Diagnosis present

## 2019-05-16 DIAGNOSIS — I428 Other cardiomyopathies: Secondary | ICD-10-CM | POA: Diagnosis present

## 2019-05-16 DIAGNOSIS — Z940 Kidney transplant status: Secondary | ICD-10-CM

## 2019-05-16 DIAGNOSIS — Z992 Dependence on renal dialysis: Secondary | ICD-10-CM

## 2019-05-16 DIAGNOSIS — E1122 Type 2 diabetes mellitus with diabetic chronic kidney disease: Secondary | ICD-10-CM | POA: Diagnosis present

## 2019-05-16 DIAGNOSIS — G8929 Other chronic pain: Secondary | ICD-10-CM | POA: Diagnosis present

## 2019-05-16 DIAGNOSIS — I10 Essential (primary) hypertension: Secondary | ICD-10-CM | POA: Diagnosis present

## 2019-05-16 DIAGNOSIS — Z20828 Contact with and (suspected) exposure to other viral communicable diseases: Secondary | ICD-10-CM | POA: Diagnosis present

## 2019-05-16 DIAGNOSIS — K859 Acute pancreatitis without necrosis or infection, unspecified: Principal | ICD-10-CM | POA: Diagnosis present

## 2019-05-16 DIAGNOSIS — I120 Hypertensive chronic kidney disease with stage 5 chronic kidney disease or end stage renal disease: Secondary | ICD-10-CM | POA: Diagnosis not present

## 2019-05-16 DIAGNOSIS — I132 Hypertensive heart and chronic kidney disease with heart failure and with stage 5 chronic kidney disease, or end stage renal disease: Secondary | ICD-10-CM | POA: Diagnosis present

## 2019-05-16 DIAGNOSIS — Z79899 Other long term (current) drug therapy: Secondary | ICD-10-CM

## 2019-05-16 DIAGNOSIS — E8889 Other specified metabolic disorders: Secondary | ICD-10-CM | POA: Diagnosis present

## 2019-05-16 MED ORDER — SODIUM CHLORIDE 0.9% FLUSH: 3.0000 mL | Freq: Once | INTRAVENOUS | Status: DC

## 2019-05-16 NOTE — ED Notes (Signed)
Pt refusing blood work stating "im a hard stick and you need to warm my hands up and start an IV before you blow my arm out". Triage RN informed.

## 2019-05-16 NOTE — ED Triage Notes (Signed)
Pt here for eval of pancreatitis. Missed dialysis Thursday. Denies Chest pain.

## 2019-05-17 ENCOUNTER — Other Ambulatory Visit: Payer: Self-pay

## 2019-05-17 ENCOUNTER — Emergency Department (HOSPITAL_COMMUNITY): Payer: Medicare Other

## 2019-05-17 ENCOUNTER — Encounter (HOSPITAL_COMMUNITY): Payer: Self-pay | Admitting: *Deleted

## 2019-05-17 DIAGNOSIS — K861 Other chronic pancreatitis: Secondary | ICD-10-CM

## 2019-05-17 DIAGNOSIS — R0602 Shortness of breath: Secondary | ICD-10-CM | POA: Diagnosis not present

## 2019-05-17 DIAGNOSIS — Z992 Dependence on renal dialysis: Secondary | ICD-10-CM

## 2019-05-17 DIAGNOSIS — N19 Unspecified kidney failure: Secondary | ICD-10-CM | POA: Diagnosis present

## 2019-05-17 DIAGNOSIS — E875 Hyperkalemia: Secondary | ICD-10-CM

## 2019-05-17 DIAGNOSIS — N186 End stage renal disease: Secondary | ICD-10-CM | POA: Diagnosis not present

## 2019-05-17 LAB — CBC
HCT: 41.7 % (ref 39.0–52.0)
Hemoglobin: 12.7 g/dL — ABNORMAL LOW (ref 13.0–17.0)
MCH: 28.4 pg (ref 26.0–34.0)
MCHC: 30.5 g/dL (ref 30.0–36.0)
MCV: 93.3 fL (ref 80.0–100.0)
Platelets: 149 10*3/uL — ABNORMAL LOW (ref 150–400)
RBC: 4.47 MIL/uL (ref 4.22–5.81)
RDW: 17.7 % — ABNORMAL HIGH (ref 11.5–15.5)
WBC: 4.7 10*3/uL (ref 4.0–10.5)
nRBC: 0 % (ref 0.0–0.2)

## 2019-05-17 LAB — CBG MONITORING, ED
Glucose-Capillary: 74 mg/dL (ref 70–99)
Glucose-Capillary: 68 mg/dL — ABNORMAL LOW (ref 70–99)
Glucose-Capillary: 66 mg/dL — ABNORMAL LOW (ref 70–99)

## 2019-05-17 LAB — BASIC METABOLIC PANEL
Anion gap: 21 — ABNORMAL HIGH (ref 5–15)
BUN: 94 mg/dL — ABNORMAL HIGH (ref 6–20)
CO2: 18 mmol/L — ABNORMAL LOW (ref 22–32)
Calcium: 7.6 mg/dL — ABNORMAL LOW (ref 8.9–10.3)
Chloride: 99 mmol/L (ref 98–111)
Creatinine, Ser: 19.46 mg/dL — ABNORMAL HIGH (ref 0.61–1.24)
GFR calc Af Amer: 3 mL/min — ABNORMAL LOW (ref >60)
GFR calc non Af Amer: 2 mL/min — ABNORMAL LOW (ref >60)
Glucose, Bld: 74 mg/dL (ref 70–99)
Potassium: 6.4 mmol/L (ref 3.5–5.1)
Sodium: 138 mmol/L (ref 135–145)

## 2019-05-17 LAB — SARS CORONAVIRUS 2 (TAT 6-24 HRS): SARS Coronavirus 2: NEGATIVE

## 2019-05-17 LAB — HEPATIC FUNCTION PANEL
ALT: 12 U/L (ref 0–44)
AST: 13 U/L — ABNORMAL LOW (ref 15–41)
Albumin: 3.2 g/dL — ABNORMAL LOW (ref 3.5–5.0)
Alkaline Phosphatase: 171 U/L — ABNORMAL HIGH (ref 38–126)
Bilirubin, Direct: 0.1 mg/dL (ref 0.0–0.2)
Indirect Bilirubin: 0.7 mg/dL (ref 0.3–0.9)
Total Bilirubin: 0.8 mg/dL (ref 0.3–1.2)
Total Protein: 7.4 g/dL (ref 6.5–8.1)

## 2019-05-17 LAB — TROPONIN I (HIGH SENSITIVITY)
Troponin I (High Sensitivity): 35 ng/L — ABNORMAL HIGH (ref <18)
Troponin I (High Sensitivity): 32 ng/L — ABNORMAL HIGH (ref <18)

## 2019-05-17 LAB — LIPASE, BLOOD: Lipase: 106 U/L — ABNORMAL HIGH (ref 11–51)

## 2019-05-17 LAB — HIV ANTIBODY (ROUTINE TESTING W REFLEX): HIV Screen 4th Generation wRfx: NONREACTIVE

## 2019-05-17 LAB — HEPATITIS B SURFACE ANTIGEN: Hepatitis B Surface Ag: NONREACTIVE

## 2019-05-17 MED ORDER — HYDROMORPHONE HCL 1 MG/ML IJ SOLN
1.0000 mg | Freq: Once | INTRAMUSCULAR | Status: AC
  Administered 2019-05-17: 1 mg via INTRAVENOUS
  Filled 2019-05-17: qty 1

## 2019-05-17 MED ORDER — CYCLOBENZAPRINE HCL 10 MG PO TABS
10.0000 mg | ORAL_TABLET | Freq: Three times a day (TID) | ORAL | Status: DC
  Administered 2019-05-17 – 2019-05-20 (×10): 10 mg via ORAL
  Filled 2019-05-17: qty 1
  Filled 2019-05-17: qty 2
  Filled 2019-05-17 (×8): qty 1

## 2019-05-17 MED ORDER — ONDANSETRON HCL 4 MG PO TABS: 4.0000 mg | ORAL_TABLET | Freq: Four times a day (QID) | ORAL | Status: DC | PRN

## 2019-05-17 MED ORDER — SODIUM CHLORIDE 0.9% FLUSH
3.0000 mL | Freq: Two times a day (BID) | INTRAVENOUS | Status: DC
  Administered 2019-05-17 – 2019-05-20 (×5): 3 mL via INTRAVENOUS

## 2019-05-17 MED ORDER — AMLODIPINE BESYLATE 5 MG PO TABS
5.0000 mg | ORAL_TABLET | Freq: Once | ORAL | Status: AC
  Administered 2019-05-17: 5 mg via ORAL
  Filled 2019-05-17: qty 1

## 2019-05-17 MED ORDER — CHLORHEXIDINE GLUCONATE CLOTH 2 % EX PADS: 6.0000 | MEDICATED_PAD | Freq: Every day | CUTANEOUS | Status: DC

## 2019-05-17 MED ORDER — LINACLOTIDE 72 MCG PO CAPS
144.0000 ug | ORAL_CAPSULE | Freq: Every day | ORAL | Status: DC
  Administered 2019-05-18 – 2019-05-19 (×2): 144 ug via ORAL
  Filled 2019-05-17 (×5): qty 2

## 2019-05-17 MED ORDER — MORPHINE SULFATE (PF) 2 MG/ML IV SOLN
INTRAVENOUS | Status: AC
  Filled 2019-05-17: qty 1

## 2019-05-17 MED ORDER — ONDANSETRON HCL 4 MG/2ML IJ SOLN
4.0000 mg | Freq: Four times a day (QID) | INTRAMUSCULAR | Status: DC | PRN
  Administered 2019-05-17 – 2019-05-19 (×2): 4 mg via INTRAVENOUS
  Filled 2019-05-17 (×2): qty 2

## 2019-05-17 MED ORDER — SODIUM CHLORIDE 0.9 % IV SOLN: 250.0000 mL | INTRAVENOUS | Status: DC | PRN

## 2019-05-17 MED ORDER — PANTOPRAZOLE SODIUM 40 MG PO TBEC
40.0000 mg | DELAYED_RELEASE_TABLET | Freq: Every day | ORAL | Status: DC
  Administered 2019-05-17 – 2019-05-20 (×4): 40 mg via ORAL
  Filled 2019-05-17 (×4): qty 1

## 2019-05-17 MED ORDER — HYDROMORPHONE HCL 1 MG/ML IJ SOLN
1.0000 mg | INTRAMUSCULAR | Status: DC | PRN
  Administered 2019-05-17 – 2019-05-21 (×22): 1 mg via INTRAVENOUS
  Filled 2019-05-17 (×19): qty 1

## 2019-05-17 MED ORDER — AMLODIPINE BESYLATE 5 MG PO TABS
5.0000 mg | ORAL_TABLET | Freq: Every day | ORAL | Status: DC
  Administered 2019-05-18 – 2019-05-20 (×3): 5 mg via ORAL
  Filled 2019-05-17 (×3): qty 1

## 2019-05-17 MED ORDER — ONDANSETRON HCL 4 MG/2ML IJ SOLN
4.0000 mg | Freq: Once | INTRAMUSCULAR | Status: AC
  Administered 2019-05-17: 4 mg via INTRAVENOUS
  Filled 2019-05-17: qty 2

## 2019-05-17 MED ORDER — HYDRALAZINE HCL 50 MG PO TABS
100.0000 mg | ORAL_TABLET | Freq: Three times a day (TID) | ORAL | Status: DC
  Administered 2019-05-17 – 2019-05-20 (×9): 100 mg via ORAL
  Filled 2019-05-17 (×10): qty 2

## 2019-05-17 MED ORDER — SODIUM ZIRCONIUM CYCLOSILICATE 10 G PO PACK
10.0000 g | PACK | Freq: Once | ORAL | Status: AC
  Administered 2019-05-17: 10 g via ORAL
  Filled 2019-05-17: qty 1

## 2019-05-17 MED ORDER — HYDRALAZINE HCL 25 MG PO TABS
100.0000 mg | ORAL_TABLET | Freq: Once | ORAL | Status: AC
  Administered 2019-05-17: 100 mg via ORAL
  Filled 2019-05-17: qty 4

## 2019-05-17 MED ORDER — MORPHINE SULFATE (PF) 2 MG/ML IV SOLN
2.0000 mg | INTRAVENOUS | Status: DC | PRN
  Administered 2019-05-17 (×2): 2 mg via INTRAVENOUS
  Filled 2019-05-17: qty 1

## 2019-05-17 MED ORDER — NITROGLYCERIN 0.4 MG SL SUBL: 0.4000 mg | SUBLINGUAL_TABLET | SUBLINGUAL | Status: DC | PRN

## 2019-05-17 MED ORDER — APIXABAN 5 MG PO TABS
5.0000 mg | ORAL_TABLET | Freq: Two times a day (BID) | ORAL | Status: DC
  Administered 2019-05-17 – 2019-05-20 (×7): 5 mg via ORAL
  Filled 2019-05-17 (×8): qty 1

## 2019-05-17 MED ORDER — SODIUM THIOSULFATE 25 % IV SOLN
25.0000 g | INTRAVENOUS | Status: DC
  Administered 2019-05-19 – 2019-05-20 (×2): 25 g via INTRAVENOUS
  Filled 2019-05-17 (×4): qty 100

## 2019-05-17 MED ORDER — OXYCODONE HCL 5 MG PO TABS: 5.0000 mg | ORAL_TABLET | ORAL | Status: DC | PRN

## 2019-05-17 MED ORDER — SODIUM CHLORIDE 0.9% FLUSH: 3.0000 mL | INTRAVENOUS | Status: DC | PRN

## 2019-05-17 MED ORDER — ONDANSETRON 4 MG PO TBDP: 4.0000 mg | ORAL_TABLET | Freq: Three times a day (TID) | ORAL | Status: DC | PRN

## 2019-05-17 MED ORDER — LISINOPRIL 5 MG PO TABS
5.0000 mg | ORAL_TABLET | Freq: Every day | ORAL | Status: DC
  Administered 2019-05-17 – 2019-05-20 (×4): 5 mg via ORAL
  Filled 2019-05-17 (×4): qty 1

## 2019-05-17 MED ORDER — HYDROMORPHONE HCL 1 MG/ML IJ SOLN
0.5000 mg | Freq: Once | INTRAMUSCULAR | Status: AC
  Administered 2019-05-17: 0.5 mg via INTRAVENOUS
  Filled 2019-05-17: qty 1

## 2019-05-17 NOTE — ED Notes (Signed)
Attempted IV start x 1-- labs obtained-- no IV
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
## 2019-05-17 NOTE — ED Notes (Signed)
Checked sugar, 68

## 2019-05-17 NOTE — ED Notes (Signed)
Pt refusing xray until pain meds
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
## 2019-05-17 NOTE — ED Notes (Signed)
Pt refusing to let us put leads on the patient

## 2019-05-17 NOTE — ED Notes (Signed)
Pt given apple juice for cbg 68

## 2019-05-17 NOTE — ED Notes (Signed)
41mjust called, pt went upstairs to the floor and asked for food and they gave him food up there

## 2019-05-17 NOTE — Progress Notes (Addendum)
[facility] Progress Note  Subjective: Frank [name] presented to the ED due to epigastric pain, nausea and vomiting worsening over the last few days.  Says he can not eat because of the pain and nausea.  Associated syptoms include weakness and fatigue. Recently admitted from 10/16 - 10/21 for the acute on chronic pancreatitis, and seen again in ED on 10/24 for the same.  Reports he missed dialysis due to abdominal pain. Last dialysis here on 05/11/2019.  Pertinant findings in the ED include K 6.4, SCr 19.46, BUN 94, CO2 18, lipase 106, CXR indicating fluid overload.   Vitals:   05/17/19 0945 05/17/19 1000 05/17/19 1015 05/17/19 1130  BP: (!) 178/88 (!) 135/116 (!) 184/106 (!) 187/97  Pulse:      Resp:      Temp:      TempSrc:      SpO2:        Inpatient medications: . sodium chloride flush  3 mL Intravenous Once  . sodium zirconium cyclosilicate  10 g Oral Once        Exam: Gen alert, chronically ill appearing male Sclera anicteric, throat clear  No jvd or bruits Chest clear bilat to bases, no rales or wheezing RRR no MRG Abd firm, diffuse mild tenderness, no rebound Ext no edema Neuro is alert, Ox 3 , nf LUA AVF +bruit    Home meds:  - amlodipine 5 qd/ hydralazine 100 tid/ lisinopril 5 qd  - omeprazole 20 qd  - apixaban 5 bid  - oxycodone IR 43m prn/ ondansatron 464mtid prn  - sl ntg prn  - prn's/ vitamins/ supplements   [place]  400/800   72.5kg    2/2.25 bath   AVF  No heparin  - parsabiv 5 mg tiw  - mircera 200 ug every 2 wks, last ?  - sod thiosulfate 25 gm tiw IV    Assessment/ Plan: 1. Acute / chronic pancreatitis - w/ abd pain, nausea/ vomiting. Per primary 2. ESRD - on HD TTS.  [facility]  3. Hyperkalemia - K+ 6.4, missed last 2 HD due to abd pain 4. HTN - cont po home BP meds w/ sips if possible. If not will need some IV / topical BP med 5. Volume - CXR shows  possible volume overload. Plan for UF 1-2L today and 1-2L tomorrow. Need standing weights to assess EDW.  6. H/o DVT/ PE - cont eliquis 7. Anemia - Hgb 12.7. No indication for ESA at this time 8. Metabolic bone disease - CCa a little low. Hx calciphalyxis, no Ca/Vit D meds. Check phos.  9. Nutrition - renal diet w/fluid restrictions once advanced.    LiJen MowPA-C [facility] Pager: [phone] seen, examined and agree w A/P as above.  RoKelly SplinterMD 05/17/2019, 4:35 PM      Rob Beauty Pless 05/17/2019, 12:04 PM  Iron/TIBC/Ferritin/ %Sat    Component Value Date/Time   IRON 34 (L) 04/26/2018 1043   TIBC 154 (L) 04/26/2018 1043   FERRITIN 301 04/26/2018 1043   IRONPCTSAT 22 04/26/2018 1043   Recent Labs  Lab 05/17/19 1040  NA 138  K 6.4*  CL 99  CO2 18*  GLUCOSE 74  BUN 94*  CREATININE 19.46*  CALCIUM 7.6*  ALBUMIN 3.2*   Recent Labs  Lab 05/17/19 1040  AST 13*  ALT 12  ALKPHOS 171*  BILITOT 0.8  PROT 7.4   Recent Labs  Lab 05/17/19 0848  WBC 4.7  HGB 12.7*  HCT 41.7  PLT 149*
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
## 2019-05-17 NOTE — ED Notes (Signed)
Date and time results received: 05/17/19 1147 (use smartphrase ".now" to insert current time)  Test: k+ Critical Value: 6.4.  Name of Provider Notified: venter, pa  Orders Received? Or Actions Taken?: .

## 2019-05-17 NOTE — Progress Notes (Signed)
New Admission Note:  Arrival Method: Stretcher Mental Orientation: Alert and oriented x 4 Telemetry: Box 16  NSR Assessment: Completed Skin: Very dry. scaly IV: NSL Pain: 10/10 abdomen Tubes: N/A Safety Measures: Safety Fall Prevention Plan initiated.  Admission: Completed 5 M  Orientation: Patient has been orientated to the room, unit and the staff. Welcome booklet given.  Family: None  Orders have been reviewed and implemented. Will continue to monitor the patient. Call light has been placed within reach and bed alarm has been activated.   Sima Matas BSN, RN  Phone Number: [phone]

## 2019-05-17 NOTE — ED Notes (Signed)
Pt demanded Ultrasound for an IV stick. Pt is a difficult stick. Dialysis Pt.

## 2019-05-18 ENCOUNTER — Observation Stay (HOSPITAL_COMMUNITY): Payer: Medicare Other

## 2019-05-18 DIAGNOSIS — Z86718 Personal history of other venous thrombosis and embolism: Secondary | ICD-10-CM | POA: Diagnosis not present

## 2019-05-18 DIAGNOSIS — D631 Anemia in chronic kidney disease: Secondary | ICD-10-CM | POA: Diagnosis present

## 2019-05-18 DIAGNOSIS — Z888 Allergy status to other drugs, medicaments and biological substances status: Secondary | ICD-10-CM | POA: Diagnosis not present

## 2019-05-18 DIAGNOSIS — K859 Acute pancreatitis without necrosis or infection, unspecified: Secondary | ICD-10-CM

## 2019-05-18 DIAGNOSIS — G894 Chronic pain syndrome: Secondary | ICD-10-CM | POA: Diagnosis present

## 2019-05-18 DIAGNOSIS — E875 Hyperkalemia: Secondary | ICD-10-CM | POA: Diagnosis not present

## 2019-05-18 DIAGNOSIS — Y830 Surgical operation with transplant of whole organ as the cause of abnormal reaction of the patient, or of later complication, without mention of misadventure at the time of the procedure: Secondary | ICD-10-CM | POA: Diagnosis present

## 2019-05-18 DIAGNOSIS — R109 Unspecified abdominal pain: Secondary | ICD-10-CM | POA: Diagnosis not present

## 2019-05-18 DIAGNOSIS — E8889 Other specified metabolic disorders: Secondary | ICD-10-CM | POA: Diagnosis present

## 2019-05-18 DIAGNOSIS — Z86711 Personal history of pulmonary embolism: Secondary | ICD-10-CM | POA: Diagnosis not present

## 2019-05-18 DIAGNOSIS — Z79899 Other long term (current) drug therapy: Secondary | ICD-10-CM | POA: Diagnosis not present

## 2019-05-18 DIAGNOSIS — T8612 Kidney transplant failure: Secondary | ICD-10-CM | POA: Diagnosis present

## 2019-05-18 DIAGNOSIS — I5043 Acute on chronic combined systolic (congestive) and diastolic (congestive) heart failure: Secondary | ICD-10-CM | POA: Diagnosis present

## 2019-05-18 DIAGNOSIS — N186 End stage renal disease: Secondary | ICD-10-CM | POA: Diagnosis not present

## 2019-05-18 DIAGNOSIS — Z20828 Contact with and (suspected) exposure to other viral communicable diseases: Secondary | ICD-10-CM | POA: Diagnosis present

## 2019-05-18 DIAGNOSIS — I120 Hypertensive chronic kidney disease with stage 5 chronic kidney disease or end stage renal disease: Secondary | ICD-10-CM | POA: Diagnosis not present

## 2019-05-18 DIAGNOSIS — E1122 Type 2 diabetes mellitus with diabetic chronic kidney disease: Secondary | ICD-10-CM | POA: Diagnosis present

## 2019-05-18 DIAGNOSIS — Z7901 Long term (current) use of anticoagulants: Secondary | ICD-10-CM | POA: Diagnosis not present

## 2019-05-18 DIAGNOSIS — Z79891 Long term (current) use of opiate analgesic: Secondary | ICD-10-CM | POA: Diagnosis not present

## 2019-05-18 DIAGNOSIS — Z940 Kidney transplant status: Secondary | ICD-10-CM | POA: Diagnosis not present

## 2019-05-18 DIAGNOSIS — Z992 Dependence on renal dialysis: Secondary | ICD-10-CM | POA: Diagnosis not present

## 2019-05-18 DIAGNOSIS — I132 Hypertensive heart and chronic kidney disease with heart failure and with stage 5 chronic kidney disease, or end stage renal disease: Secondary | ICD-10-CM | POA: Diagnosis present

## 2019-05-18 DIAGNOSIS — Z9115 Patient's noncompliance with renal dialysis: Secondary | ICD-10-CM | POA: Diagnosis not present

## 2019-05-18 DIAGNOSIS — I428 Other cardiomyopathies: Secondary | ICD-10-CM | POA: Diagnosis present

## 2019-05-18 DIAGNOSIS — Z8249 Family history of ischemic heart disease and other diseases of the circulatory system: Secondary | ICD-10-CM | POA: Diagnosis not present

## 2019-05-18 DIAGNOSIS — K7460 Unspecified cirrhosis of liver: Secondary | ICD-10-CM | POA: Diagnosis present

## 2019-05-18 DIAGNOSIS — K861 Other chronic pancreatitis: Secondary | ICD-10-CM | POA: Diagnosis not present

## 2019-05-18 DIAGNOSIS — I700 Atherosclerosis of aorta: Secondary | ICD-10-CM | POA: Diagnosis present

## 2019-05-18 LAB — GLUCOSE, CAPILLARY
Glucose-Capillary: 104 mg/dL — ABNORMAL HIGH (ref 70–99)
Glucose-Capillary: 76 mg/dL (ref 70–99)

## 2019-05-18 LAB — MRSA PCR SCREENING: MRSA by PCR: NEGATIVE

## 2019-05-18 MED ORDER — LANTHANUM CARBONATE 500 MG PO CHEW
1000.0000 mg | CHEWABLE_TABLET | Freq: Three times a day (TID) | ORAL | Status: DC
  Administered 2019-05-18 – 2019-05-19 (×3): 1000 mg via ORAL
  Filled 2019-05-18 (×3): qty 2

## 2019-05-18 NOTE — Progress Notes (Signed)
[facility] PROGRESS NOTE  Frank [name] RCB:[id number] DOB: May 30, 1966 DOA: 05/16/2019 PCP: Patient, [name]  Assessment/Plan:  #1. Acute on chronic pancreatitis. Hx of same. Lipase elevated. Unable to give iv fluids due to ESRD/cardiomyopathy. Bowel rest with small frequent meals.  -pain management -volume management with dialysis -supportive therapy.   #2. ESRD on dialysis and hyperkalemia. Hx non-compliance with dialysis. Skipped 2 sessions last week. Dialysis yesterday and today. -appreciate nephrology assistance  #3. Acute on chronic combined heart failure/Cardiomyopathy. Related to missing dialysis. Chest xray with volume overload. Last echo with EF 35%.  -dialysis   #4. Hyperkalemia. See #2.  -dialysis today -recheck in am  #5. Hypertension. BP elevated. Needs dialysis. Home meds include hydralazine, amlodipine, lisinopril -continue home med -monitor  #6. Diabetes. Diet controlled.  Serum glucose 78. Home meds include oral agent -monitor  #7. Chronic pain.  -continue home meds   Code Status: full Family Communication: patient Disposition Plan: home hopefully tomorrow   Consultants:  schertz  Procedures:  dialysis  Antibiotics:    HPI/Subjective: Awake alert. Complains abdominal pain while eating breakfast. Discussed small frequent meals. He reports he has to "go slow with eating during flare"  Objective: Vitals:   05/17/19 2131 05/17/19 2318  BP: (!) 180/80 (!) 179/88  Pulse: 78 78  Resp: 20 20  Temp: 98 F (36.7 C) (!) 97.3 F (36.3 C)  SpO2: 100% 99%    Intake/Output Summary (Last 24 hours) at 05/18/2019 1402 Last data filed at 05/18/2019 0600 Gross per 24 hour  Intake 480 ml  Output 2000 ml  Net -1520 ml   There were no vitals filed for this visit.  Exam:   General:  Awake alert oriented no acute distress  Cardiovascular: rrr no mgr trace LE edeam  Respiratory: normal effort BS with fine crackles bilaterally no  wheeze  Abdomen: non-distended +BS diffuse tenderness to the slightest touch  Musculoskeletal: joints without swelling/erythema   Data Reviewed: Basic Metabolic Panel: Recent Labs  Lab 05/15/19 0534 05/17/19 1040  NA 139 138  K 5.7* 6.4*  CL 98 99  CO2 22 18*  GLUCOSE 78 74  BUN 73* 94*  CREATININE 15.06* 19.46*  CALCIUM 8.4* 7.6*   Liver Function Tests: Recent Labs  Lab 05/15/19 0534 05/17/19 1040  AST 24 13*  ALT 15 12  ALKPHOS 187* 171*  BILITOT 0.8 0.8  PROT 8.9* 7.4  ALBUMIN 4.0 3.2*   Recent Labs  Lab 05/15/19 0534 05/17/19 1040  LIPASE 150* 106*   No results for input(s): AMMONIA in the last 168 hours. CBC: Recent Labs  Lab 05/11/19 1504 05/15/19 0534 05/17/19 0848  WBC 4.4 5.0 4.7  HGB 9.9* 11.5* 12.7*  HCT 30.6* 38.1* 41.7  MCV 88.7 91.4 93.3  PLT 158 164 149*   Cardiac Enzymes: No results for input(s): CKTOTAL, CKMB, CKMBINDEX, TROPONINI in the last 168 hours. BNP (last 3 results) No results for input(s): BNP in the last 8760 hours.  ProBNP (last 3 results) No results for input(s): PROBNP in the last 8760 hours.  CBG: Recent Labs  Lab 05/12/19 1211 05/17/19 0542 05/17/19 1234 05/17/19 1635 05/17/19 2124  GLUCAP 86 74 68* 66* 104*    Recent Results (from the past 240 hour(s))  SARS CORONAVIRUS 2 (TAT 6-24 HRS) Nasopharyngeal Nasopharyngeal Swab     Status: None   Collection Time: 05/17/19 12:30 PM   Specimen: Nasopharyngeal Swab  Result Value Ref Range Status   SARS Coronavirus 2 NEGATIVE NEGATIVE Final  Comment: (NOTE) SARS-CoV-2 target nucleic acids are NOT DETECTED. The SARS-CoV-2 RNA is generally detectable in upper and lower respiratory specimens during the acute phase of infection. Negative results do not preclude SARS-CoV-2 infection, do not rule out co-infections with other pathogens, and should not be used as the sole basis for treatment or other patient management decisions. Negative results must be combined with  clinical observations, patient history, and epidemiological information. The expected result is Negative. Fact Sheet for Patients: SugarRoll.be Fact Sheet for Healthcare Providers: https://www.woods-mathews.com/ This test is not yet approved or cleared by the [place] FDA and  has been authorized for detection and/or diagnosis of SARS-CoV-2 by FDA under an Emergency Use Authorization (EUA). This EUA will remain  in effect (meaning this test can be used) for the duration of the COVID-19 declaration under Section 56 4(b)(1) of the Act, 21 U.S.C. section 360bbb-3(b)(1), unless the authorization is terminated or revoked sooner. Performed at [facility], [place] [address] [place], [place] [address]   MRSA PCR Screening     Status: None   Collection Time: 05/17/19 11:20 PM   Specimen: Nasal Mucosa; Nasopharyngeal  Result Value Ref Range Status   MRSA by PCR NEGATIVE NEGATIVE Final    Comment:        The GeneXpert MRSA Assay (FDA approved for NASAL specimens only), is one component of a comprehensive MRSA colonization surveillance program. It is not intended to diagnose MRSA infection nor to guide or monitor treatment for MRSA infections. Performed at [facility], [place] [address], [place], [place] [address]      Studies: Dg Abd 1 View  Result Date: 05/18/2019 CLINICAL DATA:  Acute right-sided abdominal pain. EXAM: ABDOMEN - 1 VIEW COMPARISON:  May 06, 2019.  April 19, 2019. FINDINGS: The bowel gas pattern is normal. No radio-opaque calculi or other significant radiographic abnormality are seen. IMPRESSION: No evidence of bowel obstruction or ileus. Aortic Atherosclerosis (ICD10-I70.0). Electronically Signed   By: Marijo Conception M.D.   On: 05/18/2019 13:29   Dg Chest Port 1 View  Result Date: 05/17/2019 CLINICAL DATA:  Short of breath.  Missed dialysis EXAM: PORTABLE CHEST 1 VIEW COMPARISON:  05/06/2019 FINDINGS:  Cardiac enlargement. Progression of vascular congestion and mild edema. Mild to moderate right pleural effusion unchanged with right lower lobe atelectasis. Surgical clips overlying the right hilum unchanged. IMPRESSION: Progression of fluid overload with mild edema. Right pleural effusion approximately unchanged. Electronically Signed   By: Franchot Gallo M.D.   On: 05/17/2019 11:17    Scheduled Meds: . amLODipine  5 mg Oral Daily  . apixaban  5 mg Oral BID  . Chlorhexidine Gluconate Cloth  6 each Topical Q0600  . Chlorhexidine Gluconate Cloth  6 each Topical Q0600  . cyclobenzaprine  10 mg Oral TID  . hydrALAZINE  100 mg Oral TID  . lanthanum  1,000 mg Oral TID WC  . linaclotide  144 mcg Oral QAC breakfast  . lisinopril  5 mg Oral Daily  . pantoprazole  40 mg Oral Daily  . sodium chloride flush  3 mL Intravenous Once  . sodium chloride flush  3 mL Intravenous Q12H   Continuous Infusions: . sodium chloride    . sodium thiosulfate infusion for calciphylaxis      Principal Problem:   Acute on chronic pancreatitis (HCC) Active Problems:   End-stage renal disease on hemodialysis (HCC)   Volume overload   Uremia   Essential hypertension   Recurrent abdominal pain  Nonischemic cardiomyopathy (HCC)   Hyperkalemia   DM (diabetes mellitus), type 2, uncontrolled, with renal complications (HCC)   Chronic pain    Time spent: 41 minutes    [place] NP  [facility]  If 7PM-7AM, please contact night-coverage at [email or web address], password [facility] 05/18/2019, 2:02 PM  LOS: 0 days

## 2019-05-18 NOTE — Discharge Instructions (Signed)
Information on my medicine - ELIQUIS (apixaban)   Why was Eliquis prescribed for you? Eliquis was prescribed to treat blood clots that may have been found in the veins of your legs (deep vein thrombosis) or in your lungs (pulmonary embolism) and to reduce the risk of them occurring again.  What do You need to know about Eliquis ? The dose is 5 mg taken TWICE daily.  Eliquis may be taken with or without food.   Try to take the dose about the same time in the morning and in the evening. If you have difficulty swallowing the tablet whole please discuss with your pharmacist how to take the medication safely.  Take Eliquis exactly as prescribed and DO NOT stop taking Eliquis without talking to the doctor who prescribed the medication.  Stopping may increase your risk of developing a new blood clot.  Refill your prescription before you run out.  After discharge, you should have regular check-up appointments with your healthcare provider that is prescribing your Eliquis.    What do you do if you miss a dose? If a dose of ELIQUIS is not taken at the scheduled time, take it as soon as possible on the same day and twice-daily administration should be resumed. The dose should not be doubled to make up for a missed dose.  Important Safety Information A possible side effect of Eliquis is bleeding. You should call your healthcare provider right away if you experience any of the following: ? Bleeding from an injury or your nose that does not stop. ? Unusual colored urine (red or dark brown) or unusual colored stools (red or black). ? Unusual bruising for unknown reasons. ? A serious fall or if you hit your head (even if there is no bleeding).  Some medicines may interact with Eliquis and might increase your risk of bleeding or clotting while on Eliquis. To help avoid this, consult your healthcare provider or pharmacist prior to using any new prescription or non-prescription medications,  including herbals, vitamins, non-steroidal anti-inflammatory drugs (NSAIDs) and supplements.  This website has more information on Eliquis (apixaban): http://www.eliquis.com/eliquis/home

## 2019-05-18 NOTE — Progress Notes (Addendum)
[facility] Progress Note   Subjective:   Patient seen and examined at bedside. Reports pain is a little worse today. Able to tolerated some PO fluids. Wants binders ordered with meals.  Objective Vitals:   05/17/19 2030 05/17/19 2100 05/17/19 2131 05/17/19 2318  BP: (!) 180/90 (!) 190/90 (!) 180/80 (!) 179/88  Pulse: 80 78 78 78  Resp: _0 Temp:   98 F (36.7 C) (!) 97.3 F (36.3 C)  TempSrc:   Oral Oral  SpO2: 100% 100% 100% 99%   Physical Exam General:NAD, WDWN Heart:RRR Lungs:CTAB Abdomen:soft, +diffuse tenderness, ND Extremities:no LE edema Dialysis Access: LU AVF +b   There were no vitals filed for this visit.  Intake/Output Summary (Last 24 hours) at 05/18/2019 1102 Last data filed at 05/18/2019 0600 Gross per 24 hour  Intake 480 ml  Output 2000 ml  Net -1520 ml    Additional Objective Labs: Basic Metabolic Panel: Recent Labs  Lab 05/11/19 1145 05/15/19 0534 05/17/19 1040  NA 139 139 138  K 4.3 5.7* 6.4*  CL 99 98 99  CO2 24 22 18*  GLUCOSE 87 78 74  BUN 57* 73* 94*  CREATININE 14.66* 15.06* 19.46*  CALCIUM 7.8* 8.4* 7.6*   Liver Function Tests: Recent Labs  Lab 05/11/19 1145 05/15/19 0534 05/17/19 1040  AST 11* 24 13*  ALT _1 ALKPHOS 155* 187* 171*  BILITOT 1.2 0.8 0.8  PROT 6.9 8.9* 7.4  ALBUMIN 2.9* 4.0 3.2*   Recent Labs  Lab 05/11/19 1145 05/15/19 0534 05/17/19 1040  LIPASE 27 150* 106*   CBC: Recent Labs  Lab 05/11/19 1504 05/15/19 0534 05/17/19 0848  WBC 4.4 5.0 4.7  HGB 9.9* 11.5* 12.7*  HCT 30.6* 38.1* 41.7  MCV 88.7 91.4 93.3  PLT 158 164 149*   Blood Culture    Component Value Date/Time   SDES PLEURAL 07/29/2018 1300   SDES PLEURAL 07/29/2018 1300   SPECREQUEST NONE 07/29/2018 1300   SPECREQUEST NONE 07/29/2018 1300   CULT  07/29/2018 1300    NO GROWTH 5 DAYS Performed at [facility], [place] [address] [place], [place] [address]    REPTSTATUS 08/03/2018 FINAL  07/29/2018 1300   REPTSTATUS 07/29/2018 FINAL 07/29/2018 1300    Cardiac Enzymes: No results for input(s): CKTOTAL, CKMB, CKMBINDEX, TROPONINI in the last 168 hours. CBG: Recent Labs  Lab 05/12/19 1211 05/17/19 0542 05/17/19 1234 05/17/19 1635 05/17/19 2124  GLUCAP 86 74 68* 66* 104*   Iron Studies: No results for input(s): IRON, TIBC, TRANSFERRIN, FERRITIN in the last 72 hours. Lab Results  Component Value Date   INR 1.39 07/28/2018   INR 1.36 04/26/2018   INR 1.2 (A) 01/15/2018   Studies/Results: Dg Chest Port 1 View  Result Date: 05/17/2019 CLINICAL DATA:  Short of breath.  Missed dialysis EXAM: PORTABLE CHEST 1 VIEW COMPARISON:  05/06/2019 FINDINGS: Cardiac enlargement. Progression of vascular congestion and mild edema. Mild to moderate right pleural effusion unchanged with right lower lobe atelectasis. Surgical clips overlying the right hilum unchanged. IMPRESSION: Progression of fluid overload with mild edema. Right pleural effusion approximately unchanged. Electronically Signed   By: Franchot Gallo M.D.   On: 05/17/2019 11:17    Medications: . sodium chloride    . sodium thiosulfate infusion for calciphylaxis     . amLODipine  5 mg Oral Daily  . apixaban  5 mg Oral BID  . Chlorhexidine Gluconate Cloth  6 each Topical Q0600  .  Chlorhexidine Gluconate Cloth  6 each Topical Q0600  . cyclobenzaprine  10 mg Oral TID  . hydrALAZINE  100 mg Oral TID  . linaclotide  144 mcg Oral QAC breakfast  . lisinopril  5 mg Oral Daily  . pantoprazole  40 mg Oral Daily  . sodium chloride flush  3 mL Intravenous Once  . sodium chloride flush  3 mL Intravenous Q12H    Dialysis Orders: Outpt HD: TTS [place] 4h 400/800 72.5kg 2/2.25 bath AVF No heparin - parsabiv 5 mg tiw - mircera 200 ug every 2 wks, last ? - sod thiosulfate 25 gm tiw IV  Home meds: - amlodipine 5 qd/ hydralazine 100 tid/ lisinopril 5 qd - omeprazole 20 qd - apixaban 5 bid  - oxycodone IR 98m  prn/ ondansatron 421mtid prn - sl ntg prn - prn's/ vitamins/ supplements  Assessment/Plan: 1. Acute / chronic pancreatitis - w/ abd pain, nausea/ vomiting. Per primary 2. ESRD - on HD TTS. HD overnight & again today to get back on schedule.  3. Hyperkalemia - K+ 6.4, expect improvement.  Checking labs pre HD. 4. HTN - continue home BP meds.  5. Volume - CXR shows possible volume overload. HD last night with net UF goal 2L, again today with same goal. Need standing weights to assess EDW.  6. H/o DVT/ PE - cont eliquis 7. Anemia - Hgb 12.7. No indication for ESA at this time 8. Metabolic bone disease - CCa a little low. Hx calciphalyxis, no Ca/Vit D meds. Check phos.  9. Nutrition - renal diet w/fluid restrictions once advanced.   LiJen MowPA-C [facility] Pager: [phone]/27/2020,11:02 AM  LOS: 0 days   Pt seen, examined and agree w A/P as above.  RoKelly SplinterMD 05/18/2019, 11:23 AM

## 2019-05-19 DIAGNOSIS — K861 Other chronic pancreatitis: Secondary | ICD-10-CM | POA: Diagnosis not present

## 2019-05-19 DIAGNOSIS — K859 Acute pancreatitis without necrosis or infection, unspecified: Secondary | ICD-10-CM | POA: Diagnosis not present

## 2019-05-19 LAB — CBC
HCT: 31.4 % — ABNORMAL LOW (ref 39.0–52.0)
Hemoglobin: 10 g/dL — ABNORMAL LOW (ref 13.0–17.0)
MCH: 28.2 pg (ref 26.0–34.0)
MCHC: 31.8 g/dL (ref 30.0–36.0)
MCV: 88.7 fL (ref 80.0–100.0)
Platelets: 139 10*3/uL — ABNORMAL LOW (ref 150–400)
RBC: 3.54 MIL/uL — ABNORMAL LOW (ref 4.22–5.81)
RDW: 16.6 % — ABNORMAL HIGH (ref 11.5–15.5)
WBC: 4.7 10*3/uL (ref 4.0–10.5)
nRBC: 0 % (ref 0.0–0.2)

## 2019-05-19 LAB — COMPREHENSIVE METABOLIC PANEL
ALT: 10 U/L (ref 0–44)
AST: 13 U/L — ABNORMAL LOW (ref 15–41)
Albumin: 3.2 g/dL — ABNORMAL LOW (ref 3.5–5.0)
Alkaline Phosphatase: 185 U/L — ABNORMAL HIGH (ref 38–126)
Anion gap: 15 (ref 5–15)
BUN: 66 mg/dL — ABNORMAL HIGH (ref 6–20)
CO2: 22 mmol/L (ref 22–32)
Calcium: 8 mg/dL — ABNORMAL LOW (ref 8.9–10.3)
Chloride: 95 mmol/L — ABNORMAL LOW (ref 98–111)
Creatinine, Ser: 14.78 mg/dL — ABNORMAL HIGH (ref 0.61–1.24)
GFR calc Af Amer: 4 mL/min — ABNORMAL LOW (ref >60)
GFR calc non Af Amer: 3 mL/min — ABNORMAL LOW (ref >60)
Glucose, Bld: 107 mg/dL — ABNORMAL HIGH (ref 70–99)
Potassium: 5.6 mmol/L — ABNORMAL HIGH (ref 3.5–5.1)
Sodium: 132 mmol/L — ABNORMAL LOW (ref 135–145)
Total Bilirubin: 0.6 mg/dL (ref 0.3–1.2)
Total Protein: 7.3 g/dL (ref 6.5–8.1)

## 2019-05-19 LAB — GLUCOSE, CAPILLARY
Glucose-Capillary: 63 mg/dL — ABNORMAL LOW (ref 70–99)
Glucose-Capillary: 101 mg/dL — ABNORMAL HIGH (ref 70–99)

## 2019-05-19 MED ORDER — RENA-VITE PO TABS
1.0000 | ORAL_TABLET | Freq: Every day | ORAL | Status: DC
  Administered 2019-05-19 – 2019-05-20 (×2): 1 via ORAL
  Filled 2019-05-19 (×2): qty 1

## 2019-05-19 MED ORDER — HYDROMORPHONE HCL 1 MG/ML IJ SOLN
INTRAMUSCULAR | Status: AC
  Filled 2019-05-19: qty 1

## 2019-05-19 MED ORDER — ONDANSETRON HCL 4 MG/2ML IJ SOLN
INTRAMUSCULAR | Status: AC
  Filled 2019-05-19: qty 2

## 2019-05-19 MED ORDER — CHLORHEXIDINE GLUCONATE CLOTH 2 % EX PADS: 6.0000 | MEDICATED_PAD | Freq: Every day | CUTANEOUS | Status: DC

## 2019-05-19 MED ORDER — GLUCOSE 40 % PO GEL: 1.0000 | ORAL | Status: DC

## 2019-05-19 MED ORDER — CHLORHEXIDINE GLUCONATE CLOTH 2 % EX PADS
6.0000 | MEDICATED_PAD | Freq: Every day | CUTANEOUS | Status: DC
  Administered 2019-05-19: 6 via TOPICAL

## 2019-05-19 NOTE — Plan of Care (Signed)
  Problem: Elimination: Goal: Will not experience complications related to bowel motility Outcome: Completed/Met Goal: Will not experience complications related to urinary retention Outcome: Completed/Met   Problem: Education: Goal: Knowledge of disease and its progression will improve Outcome: Completed/Met   Problem: Fluid Volume: Goal: Compliance with measures to maintain balanced fluid volume will improve Outcome: Completed/Met   Problem: Health Behavior/Discharge Planning: Goal: Ability to manage health-related needs will improve Outcome: Completed/Met

## 2019-05-19 NOTE — Progress Notes (Signed)
PROGRESS NOTE    Frank [name]  VXB:[id number] DOB: [birth year] DOA: 05/16/2019 PCP: Patient, [name]  Brief Narrative Frank [name]  is a 53 y.o. male, with past medical history significant for end-stage renal disease on hemodialysis, chronic pancreatitis and chronic pain on pain control presented to the ED 10/25 with 5 days history of epigastric pain associated with nausea and vomiting.  Patient missed several dialysis sessions ,  in the emergency room where he was found to be hyperkalemic at 6.4 with BUN of 94 and creatinine of 19. -Patient reported having intermittent flares of pancreatitis,  found to have mildly elevated lipase level on admission -Received 2 HD treatments following admission  Assessment & Plan:   #1.  Acute on chronic abdominal pain  -History of chronic pancreatitis reported, lipase is marginally elevated  -Had 2 CT abdomen pelvis in the last month one on 9/29 and repeat 2 weeks ago 10/15 both of which were unremarkable, did not show evidence of pancreatic calcification, or acute abdominal findings, calcified failed renal transplant noted  -Gallbladder was unremarkable, no gallstones -Patient denies any history of alcohol abuse -Suspect uremia is also contributing to his chronic nausea and abdominal discomfort -Supportive care, downgrade diet to clear liquids  #2. ESRD on dialysis and hyperkalemia. -With uremia, history of noncompliance -Nephrology following, HD today  #3. Acute on chronic combined heart failure/Cardiomyopathy -Volume managed with hemodialysis, last echo with EF of 35%  #4. Hyperkalemia -As above  #5. Hypertension.  -Improving with HD, continue hydralazine, amlodipine, lisinopril  #6. Diabetes.  -Diet controlled, hemoglobin A1c  #7. Chronic pain.  -He was given oxycodone during his last visit to the ED on 10/24 for same  Code Status: full Family Communication: patient Disposition Plan:  Home pending clinical improvement   Consultants:   renal   Procedures:   Antimicrobials:    Subjective: -Complains of abdominal pain, requiring IV Dilaudid every 3 hours, reportedly has also been tolerating a regular diet this whole time -Discussed need to downgrade diet, patient was not very receptive to this  Objective: Vitals:   05/19/19 0930 05/19/19 1000 05/19/19 1030 05/19/19 1037  BP: 139/66 115/85 135/66 (!) 169/98  Pulse: 92 85 88 85  Resp:    17  Temp:    98.5 F (36.9 C)  TempSrc:    Oral  SpO2:    94%  Weight:        Intake/Output Summary (Last 24 hours) at 05/19/2019 1259 Last data filed at 05/19/2019 0600 Gross per 24 hour  Intake 1277 ml  Output 0 ml  Net 1277 ml   Filed Weights   05/18/19 2121 05/19/19 0700  Weight: 72.9 kg 75.7 kg    Examination:  General exam: Averagely built African-American male, seen on hemodialysis, AAO x3 Respiratory system: Clear to auscultation Cardiovascular system: S1 & S2 heard, RRR. No JVD, murmurs, rubs, gallops Gastrointestinal system: Mild epigastric tenderness, bowel sounds present no rigidity no rebound Central nervous system: Alert and oriented. No focal neurological deficits. Extremities: No edema Skin: No rashes, lesions or ulcers Psychiatry: Poor insight and judgment   Data Reviewed:   CBC: Recent Labs  Lab 05/15/19 0534 05/17/19 0848 05/19/19 0500  WBC 5.0 4.7 4.7  HGB 11.5* 12.7* 10.0*  HCT 38.1* 41.7 31.4*  MCV 91.4 93.3 88.7  PLT 164 149* 330*   Basic Metabolic Panel: Recent Labs  Lab 05/15/19 0534 05/17/19 1040 05/19/19 0725  NA 139 138 132*  K 5.7* 6.4* 5.6*  CL  98 99 95*  CO2 22 18* 22  GLUCOSE 78 74 107*  BUN 73* 94* 66*  CREATININE 15.06* 19.46* 14.78*  CALCIUM 8.4* 7.6* 8.0*   GFR: Estimated Creatinine Clearance: 6 mL/min (A) (by C-G formula based on SCr of 14.78 mg/dL (H)). Liver Function Tests: Recent Labs  Lab 05/15/19 0534 05/17/19 1040 05/19/19 0725  AST 24 13* 13*  ALT _0 ALKPHOS 187*  171* 185*  BILITOT 0.8 0.8 0.6  PROT 8.9* 7.4 7.3  ALBUMIN 4.0 3.2* 3.2*   Recent Labs  Lab 05/15/19 0534 05/17/19 1040  LIPASE 150* 106*   No results for input(s): AMMONIA in the last 168 hours. Coagulation Profile: No results for input(s): INR, PROTIME in the last 168 hours. Cardiac Enzymes: No results for input(s): CKTOTAL, CKMB, CKMBINDEX, TROPONINI in the last 168 hours. BNP (last 3 results) No results for input(s): PROBNP in the last 8760 hours. HbA1C: No results for input(s): HGBA1C in the last 72 hours. CBG: Recent Labs  Lab 05/17/19 0542 05/17/19 1234 05/17/19 1635 05/17/19 2124 05/18/19 2121  GLUCAP 74 68* 66* 104* 76   Lipid Profile: No results for input(s): CHOL, HDL, LDLCALC, TRIG, CHOLHDL, LDLDIRECT in the last 72 hours. Thyroid Function Tests: No results for input(s): TSH, T4TOTAL, FREET4, T3FREE, THYROIDAB in the last 72 hours. Anemia Panel: No results for input(s): VITAMINB12, FOLATE, FERRITIN, TIBC, IRON, RETICCTPCT in the last 72 hours. Urine analysis:    Component Value Date/Time   COLORURINE YELLOW 10/18/2013 0419   APPEARANCEUR CLEAR 10/18/2013 0419   LABSPEC 1.008 10/18/2013 0419   PHURINE 8.5 (H) 10/18/2013 0419   GLUCOSEU 100 (A) 10/18/2013 0419   HGBUR TRACE (A) 10/18/2013 0419   BILIRUBINUR NEGATIVE 10/18/2013 0419   KETONESUR NEGATIVE 10/18/2013 0419   PROTEINUR 100 (A) 10/18/2013 0419   UROBILINOGEN 0.2 10/18/2013 0419   NITRITE NEGATIVE 10/18/2013 0419   LEUKOCYTESUR NEGATIVE 10/18/2013 0419   Sepsis Labs: _1 (procalcitonin:4,lacticidven:4)  ) Recent Results (from the past 240 hour(s))  SARS CORONAVIRUS 2 (TAT 6-24 HRS) Nasopharyngeal Nasopharyngeal Swab     Status: None   Collection Time: 05/17/19 12:30 PM   Specimen: Nasopharyngeal Swab  Result Value Ref Range Status   SARS Coronavirus 2 NEGATIVE NEGATIVE Final    Comment: (NOTE) SARS-CoV-2 target nucleic acids are NOT DETECTED. The SARS-CoV-2 RNA is generally  detectable in upper and lower respiratory specimens during the acute phase of infection. Negative results do not preclude SARS-CoV-2 infection, do not rule out co-infections with other pathogens, and should not be used as the sole basis for treatment or other patient management decisions. Negative results must be combined with clinical observations, patient history, and epidemiological information. The expected result is Negative. Fact Sheet for Patients: SugarRoll.be Fact Sheet for Healthcare Providers: https://www.woods-mathews.com/ This test is not yet approved or cleared by the [place] FDA and  has been authorized for detection and/or diagnosis of SARS-CoV-2 by FDA under an Emergency Use Authorization (EUA). This EUA will remain  in effect (meaning this test can be used) for the duration of the COVID-19 declaration under Section 56 4(b)(1) of the Act, 21 U.S.C. section 360bbb-3(b)(1), unless the authorization is terminated or revoked sooner. Performed at [facility], [place] [address] [place], [place] [address]   MRSA PCR Screening     Status: None   Collection Time: 05/17/19 11:20 PM   Specimen: Nasal Mucosa; Nasopharyngeal  Result Value Ref Range Status   MRSA by PCR NEGATIVE NEGATIVE Final  Comment:        The GeneXpert MRSA Assay (FDA approved for NASAL specimens only), is one component of a comprehensive MRSA colonization surveillance program. It is not intended to diagnose MRSA infection nor to guide or monitor treatment for MRSA infections. Performed at [facility], [place] [address] [place], [place] [address]          Radiology Studies: Dg Abd 1 View  Result Date: 05/18/2019 CLINICAL DATA:  Acute right-sided abdominal pain. EXAM: ABDOMEN - 1 VIEW COMPARISON:  May 06, 2019.  April 19, 2019. FINDINGS: The bowel gas pattern is normal. No radio-opaque calculi or other significant radiographic  abnormality are seen. IMPRESSION: No evidence of bowel obstruction or ileus. Aortic Atherosclerosis (ICD10-I70.0). Electronically Signed   By: Marijo Conception M.D.   On: 05/18/2019 13:29        Scheduled Meds: . amLODipine  5 mg Oral Daily  . apixaban  5 mg Oral BID  . Chlorhexidine Gluconate Cloth  6 each Topical Q0600  . cyclobenzaprine  10 mg Oral TID  . hydrALAZINE  100 mg Oral TID  . HYDROmorphone      . lanthanum  1,000 mg Oral TID WC  . linaclotide  144 mcg Oral QAC breakfast  . lisinopril  5 mg Oral Daily  . multivitamin  1 tablet Oral QHS  . ondansetron      . pantoprazole  40 mg Oral Daily  . sodium chloride flush  3 mL Intravenous Once  . sodium chloride flush  3 mL Intravenous Q12H   Continuous Infusions: . sodium chloride    . sodium thiosulfate infusion for calciphylaxis 200 mL/hr at 05/19/19 0917     LOS: 1 day    Time spent: 54mn    PDomenic Polite MD [facility] Page via [email or web address], password [email or web address] After 7PM please contact night-coverage  05/19/2019, 12:59 PM

## 2019-05-19 NOTE — Progress Notes (Signed)
Labs refused. Patient wants labs to be drawn  in hemodialysis.

## 2019-05-19 NOTE — Progress Notes (Signed)
Hypoglycemic Event  CBG: Results for Frank [name], Frank [name] (MRN [id number]) as of 05/19/2019 21:28  Ref. Range 05/19/2019 20:47  Glucose-Capillary Latest Ref Range: 70 - 99 mg/dL 63 (L)    Treatment:    Symptoms: None  Follow-up CBG: Time: CBG Result:Results for Frank [name], Frank [name] (MRN [id number]) as of 05/19/2019 22:11  Ref. Range 05/19/2019 22:09  Glucose-Capillary Latest Ref Range: 70 - 99 mg/dL 101 (H)    Possible Reasons for Event: Inadequate meal intake  Comments/MD notified:    Frank [name]

## 2019-05-19 NOTE — Progress Notes (Addendum)
[facility] Progress Note   Subjective:   Patient seen and examined in dialysis.  Treatment mostly tolerated well.  Some n/v toward the end.  Reports ongoing abdominal pain and n/v with meals.   Objective Vitals:   05/19/19 0850 05/19/19 0900 05/19/19 0930 05/19/19 1000  BP: 138/68 (!) 145/78 139/66 115/85  Pulse: 84 83 92 85  Resp:      Temp:      TempSrc:      SpO2:      Weight:       Physical Exam General:NAD, well appearing male, sitting up in bed Heart:RRR Lungs:mostly CTAB, +expiratory wheeze Abdomen:soft, ND, +BS Extremities:no LE edema Dialysis Access: LU AVF accessed   [facility] Weights   05/18/19 2121 05/19/19 0700  Weight: 72.9 kg 75.7 kg    Intake/Output Summary (Last 24 hours) at 05/19/2019 1105 Last data filed at 05/19/2019 0600 Gross per 24 hour  Intake 1277 ml  Output 0 ml  Net 1277 ml    Additional Objective Labs: Basic Metabolic Panel: Recent Labs  Lab 05/15/19 0534 05/17/19 1040 05/19/19 0725  NA 139 138 132*  K 5.7* 6.4* 5.6*  CL 98 99 95*  CO2 22 18* 22  GLUCOSE 78 74 107*  BUN 73* 94* 66*  CREATININE 15.06* 19.46* 14.78*  CALCIUM 8.4* 7.6* 8.0*   Liver Function Tests: Recent Labs  Lab 05/15/19 0534 05/17/19 1040 05/19/19 0725  AST 24 13* 13*  ALT _0 ALKPHOS 187* 171* 185*  BILITOT 0.8 0.8 0.6  PROT 8.9* 7.4 7.3  ALBUMIN 4.0 3.2* 3.2*   Recent Labs  Lab 05/15/19 0534 05/17/19 1040  LIPASE 150* 106*   CBC: Recent Labs  Lab 05/15/19 0534 05/17/19 0848 05/19/19 0500  WBC 5.0 4.7 4.7  HGB 11.5* 12.7* 10.0*  HCT 38.1* 41.7 31.4*  MCV 91.4 93.3 88.7  PLT 164 149* 139*   Blood Culture    Component Value Date/Time   SDES PLEURAL 07/29/2018 1300   SDES PLEURAL 07/29/2018 1300   SPECREQUEST NONE 07/29/2018 1300   SPECREQUEST NONE 07/29/2018 1300   CULT  07/29/2018 1300    NO GROWTH 5 DAYS Performed at [facility], [address], [place], [place] [address]    REPTSTATUS 08/03/2018  FINAL 07/29/2018 1300   REPTSTATUS 07/29/2018 FINAL 07/29/2018 1300   CBG: Recent Labs  Lab 05/17/19 0542 05/17/19 1234 05/17/19 1635 05/17/19 2124 05/18/19 2121  GLUCAP 74 68* 66* 104* 76   Studies/Results: Dg Abd 1 View  Result Date: 05/18/2019 CLINICAL DATA:  Acute right-sided abdominal pain. EXAM: ABDOMEN - 1 VIEW COMPARISON:  May 06, 2019.  April 19, 2019. FINDINGS: The bowel gas pattern is normal. No radio-opaque calculi or other significant radiographic abnormality are seen. IMPRESSION: No evidence of bowel obstruction or ileus. Aortic Atherosclerosis (ICD10-I70.0). Electronically Signed   By: Marijo Conception M.D.   On: 05/18/2019 13:29   Dg Chest Port 1 View  Result Date: 05/17/2019 CLINICAL DATA:  Short of breath.  Missed dialysis EXAM: PORTABLE CHEST 1 VIEW COMPARISON:  05/06/2019 FINDINGS: Cardiac enlargement. Progression of vascular congestion and mild edema. Mild to moderate right pleural effusion unchanged with right lower lobe atelectasis. Surgical clips overlying the right hilum unchanged. IMPRESSION: Progression of fluid overload with mild edema. Right pleural effusion approximately unchanged. Electronically Signed   By: Franchot Gallo M.D.   On: 05/17/2019 11:17    Medications: . sodium chloride    . sodium thiosulfate infusion for  calciphylaxis 200 mL/hr at 05/19/19 0917   . amLODipine  5 mg Oral Daily  . apixaban  5 mg Oral BID  . Chlorhexidine Gluconate Cloth  6 each Topical Q0600  . cyclobenzaprine  10 mg Oral TID  . hydrALAZINE  100 mg Oral TID  . lanthanum  1,000 mg Oral TID WC  . linaclotide  144 mcg Oral QAC breakfast  . lisinopril  5 mg Oral Daily  . pantoprazole  40 mg Oral Daily  . sodium chloride flush  3 mL Intravenous Once  . sodium chloride flush  3 mL Intravenous Q12H    Dialysis Orders: Outpt HD: TTS [place] 4h 400/800 72.5kg 2/2.25 bath AVF No heparin - parsabiv 5 mg tiw - mircera 200 ug every 2 wks, last ? - sod  thiosulfate 25 gm tiw IV  Home meds: - amlodipine 5 qd/ hydralazine 100 tid/ lisinopril 5 qd - omeprazole20 qd - apixaban 5 bid - oxycodone IR 29m prn/ ondansatron 469mtid prn - sl ntg prn - prn's/ vitamins/ supplements  Assessment/Plan: 1. Acute / chronicpancreatitis- w/ abd pain, nausea/ vomiting. Per primary 2. ESRD - on HD TTS. HD off schedule today.  Will write orders for HD tomorrow per regular schedule.  3. Hyperkalemia -K+ 5.6, HD today.  4. HTN - improved post HD. continue home BP meds.  5. Volume -Volume status improved. Closer to dry. Should meet tomorrow. 6. H/o DVT/ PE - cont eliquis 7. Anemia - Hgb 10.0. No indication for ESA at this time 8. Metabolic bone disease - CCa a little low. Hx calciphalyxis, no Ca/Vit D meds. Check phos.  9. Nutrition - renal diet w/fluid restrictions, vit   LiJen MowPA-C [facility] Pager: [phone]/28/2020,11:05 AM  LOS: 1 day   Pt seen, examined and agree w A/P as above.  RoKelly SplinterMD 05/19/2019, 2:39 PM

## 2019-05-20 DIAGNOSIS — K861 Other chronic pancreatitis: Secondary | ICD-10-CM | POA: Diagnosis not present

## 2019-05-20 DIAGNOSIS — K859 Acute pancreatitis without necrosis or infection, unspecified: Secondary | ICD-10-CM | POA: Diagnosis not present

## 2019-05-20 LAB — COMPREHENSIVE METABOLIC PANEL
ALT: 10 U/L (ref 0–44)
AST: 14 U/L — ABNORMAL LOW (ref 15–41)
Albumin: 3.2 g/dL — ABNORMAL LOW (ref 3.5–5.0)
Alkaline Phosphatase: 164 U/L — ABNORMAL HIGH (ref 38–126)
Anion gap: 20 — ABNORMAL HIGH (ref 5–15)
BUN: 39 mg/dL — ABNORMAL HIGH (ref 6–20)
CO2: 22 mmol/L (ref 22–32)
Calcium: 8.6 mg/dL — ABNORMAL LOW (ref 8.9–10.3)
Chloride: 95 mmol/L — ABNORMAL LOW (ref 98–111)
Creatinine, Ser: 10.32 mg/dL — ABNORMAL HIGH (ref 0.61–1.24)
GFR calc Af Amer: 6 mL/min — ABNORMAL LOW (ref >60)
GFR calc non Af Amer: 5 mL/min — ABNORMAL LOW (ref >60)
Glucose, Bld: 80 mg/dL (ref 70–99)
Potassium: 4.9 mmol/L (ref 3.5–5.1)
Sodium: 137 mmol/L (ref 135–145)
Total Bilirubin: 0.7 mg/dL (ref 0.3–1.2)
Total Protein: 7.3 g/dL (ref 6.5–8.1)

## 2019-05-20 LAB — CBC
HCT: 30 % — ABNORMAL LOW (ref 39.0–52.0)
Hemoglobin: 9.6 g/dL — ABNORMAL LOW (ref 13.0–17.0)
MCH: 28.3 pg (ref 26.0–34.0)
MCHC: 32 g/dL (ref 30.0–36.0)
MCV: 88.5 fL (ref 80.0–100.0)
Platelets: 125 10*3/uL — ABNORMAL LOW (ref 150–400)
RBC: 3.39 MIL/uL — ABNORMAL LOW (ref 4.22–5.81)
RDW: 16.6 % — ABNORMAL HIGH (ref 11.5–15.5)
WBC: 3.6 10*3/uL — ABNORMAL LOW (ref 4.0–10.5)
nRBC: 0 % (ref 0.0–0.2)

## 2019-05-20 LAB — LIPASE, BLOOD: Lipase: 26 U/L (ref 11–51)

## 2019-05-20 LAB — PHOSPHORUS: Phosphorus: 5.1 mg/dL — ABNORMAL HIGH (ref 2.5–4.6)

## 2019-05-20 LAB — GLUCOSE, CAPILLARY
Glucose-Capillary: 76 mg/dL (ref 70–99)
Glucose-Capillary: 66 mg/dL — ABNORMAL LOW (ref 70–99)
Glucose-Capillary: 112 mg/dL — ABNORMAL HIGH (ref 70–99)
Glucose-Capillary: 64 mg/dL — ABNORMAL LOW (ref 70–99)
Glucose-Capillary: 103 mg/dL — ABNORMAL HIGH (ref 70–99)

## 2019-05-20 MED ORDER — SODIUM CHLORIDE 0.9 % IV SOLN: 100.0000 mL | INTRAVENOUS | Status: DC | PRN

## 2019-05-20 MED ORDER — PENTAFLUOROPROP-TETRAFLUOROETH EX AERO: 1.0000 "application " | INHALATION_SPRAY | CUTANEOUS | Status: DC | PRN

## 2019-05-20 MED ORDER — ALTEPLASE 2 MG IJ SOLR: 2.0000 mg | Freq: Once | INTRAMUSCULAR | Status: DC | PRN

## 2019-05-20 MED ORDER — DARBEPOETIN ALFA 100 MCG/0.5ML IJ SOSY
PREFILLED_SYRINGE | INTRAMUSCULAR | Status: AC
  Administered 2019-05-20: 100 ug via INTRAVENOUS
  Filled 2019-05-20: qty 0.5

## 2019-05-20 MED ORDER — HEPARIN SODIUM (PORCINE) 1000 UNIT/ML DIALYSIS: 1000.0000 [IU] | INTRAMUSCULAR | Status: DC | PRN

## 2019-05-20 MED ORDER — LIDOCAINE-PRILOCAINE 2.5-2.5 % EX CREA: 1.0000 "application " | TOPICAL_CREAM | CUTANEOUS | Status: DC | PRN

## 2019-05-20 MED ORDER — POLYETHYLENE GLYCOL 3350 17 G PO PACK: 17.0000 g | PACK | Freq: Every day | ORAL | Status: DC

## 2019-05-20 MED ORDER — LIDOCAINE HCL (PF) 1 % IJ SOLN: 5.0000 mL | INTRAMUSCULAR | Status: DC | PRN

## 2019-05-20 MED ORDER — SENNOSIDES-DOCUSATE SODIUM 8.6-50 MG PO TABS
1.0000 | ORAL_TABLET | Freq: Two times a day (BID) | ORAL | Status: DC
  Filled 2019-05-20: qty 1

## 2019-05-20 MED ORDER — HYDROMORPHONE HCL 1 MG/ML IJ SOLN
INTRAMUSCULAR | Status: AC
  Filled 2019-05-20: qty 1

## 2019-05-20 MED ORDER — DARBEPOETIN ALFA 100 MCG/0.5ML IJ SOSY
100.0000 ug | PREFILLED_SYRINGE | INTRAMUSCULAR | Status: DC
  Administered 2019-05-20: 12:00:00 100 ug via INTRAVENOUS

## 2019-05-20 NOTE — Progress Notes (Signed)
PROGRESS NOTE    JOSHAU CODE  GBT:[id number] DOB: 11/21/1965 DOA: 05/16/2019 PCP: Patient, [name]  Brief Narrative Frank [name]  is a 53 y.o. male, with past medical history significant for end-stage renal disease on hemodialysis, chronic pancreatitis and chronic pain on pain control presented to the ED 10/25 with 5 days history of epigastric pain associated with nausea and vomiting.  Patient missed several dialysis sessions ,  in the emergency room where he was found to be hyperkalemic at 6.4 with BUN of 94 and creatinine of 19. -Patient reported having intermittent flares of pancreatitis,  found to have mildly elevated lipase level on admission -Received 2 HD treatments following admission  Assessment & Plan:   #1.  Acute on chronic abdominal pain  -History of chronic pancreatitis reported, lipase was marginally elevated on admission, this has normalized now -Had 2 CT abdomen pelvis in the last month one on 9/29 and repeat 2 weeks ago 10/15 both of which were unremarkable, did not show evidence of pancreatic calcification, or acute abdominal findings, calcified failed renal transplant noted  -Gallbladder was unremarkable, no gallstones -Patient denies any history of alcohol abuse -Suspect uremia is also contributing to his chronic nausea and abdominal discomfort -Mild clinical improvement, urea and creatinine are improving as well, will advance to full liquid diet, add PPI, would benefit from gastroenterology follow-up  #2. ESRD on dialysis and hyperkalemia. -With uremia, history of noncompliance -Nephrology following, HD today  #3. Acute on chronic combined heart failure/Cardiomyopathy -Volume managed with hemodialysis, last echo with EF of 35%  #4. Hyperkalemia -Resolved with HD  #5. Hypertension.  -Improving with HD, continue hydralazine, amlodipine, lisinopril  #6. Diabetes.  -Diet controlled, hemoglobin A1c  #7. Chronic pain.  -He was given oxycodone during his  last visit to the ED on 10/24 for same  Code Status: full Family Communication: patient Disposition Plan:  Home pending clinical improvement  Consultants:   renal   Procedures:   Antimicrobials:    Subjective: -Slowly starting to feel little better, still had an episode of vomiting earlier today, requests regular food  Objective: Vitals:   05/20/19 1100 05/20/19 1130 05/20/19 1141 05/20/19 1250  BP: (!) 189/100 106/89 119/85 (!) 199/108  Pulse: 82 93 69 (!) 102  Resp:   16 18  Temp:   97.8 F (36.6 C) 98.3 F (36.8 C)  TempSrc:   Oral Oral  SpO2:   98% 95%  Weight:   72.2 kg     Intake/Output Summary (Last 24 hours) at 05/20/2019 1417 Last data filed at 05/20/2019 1141 Gross per 24 hour  Intake 737 ml  Output 3000 ml  Net -2263 ml   Filed Weights   05/19/19 2047 05/20/19 0719 05/20/19 1141  Weight: 75.7 kg 75.3 kg 72.2 kg    Examination:  Gen: Averagely built African-American male, awake alert oriented x3, no distress  HEENT: PERRLA, Neck supple, no JVD Lungs: Good air movement bilaterally, CTAB CVS: RRR,No Gallops,Rubs or new Murmurs Abd: Soft mild epigastric tenderness bowel sounds present no rigidity or rebound Extremities: No Cyanosis, Clubbing or edema Skin: no new rashes Psychiatry: Poor insight and judgment   Data Reviewed:   CBC: Recent Labs  Lab 05/15/19 0534 05/17/19 0848 05/19/19 0500 05/20/19 0833  WBC 5.0 4.7 4.7 3.6*  HGB 11.5* 12.7* 10.0* 9.6*  HCT 38.1* 41.7 31.4* 30.0*  MCV 91.4 93.3 88.7 88.5  PLT 164 149* 139* 710*   Basic Metabolic Panel: Recent Labs  Lab 05/15/19 0534  05/17/19 1040 05/19/19 0725 05/20/19 0833  NA 139 138 132* 137  K 5.7* 6.4* 5.6* 4.9  CL 98 99 95* 95*  CO2 22 18* 22 22  GLUCOSE 78 74 107* 80  BUN 73* 94* 66* 39*  CREATININE 15.06* 19.46* 14.78* 10.32*  CALCIUM 8.4* 7.6* 8.0* 8.6*  PHOS  --   --   --  5.1*   GFR: Estimated Creatinine Clearance: 8.3 mL/min (A) (by C-G formula based on SCr  of 10.32 mg/dL (H)). Liver Function Tests: Recent Labs  Lab 05/15/19 0534 05/17/19 1040 05/19/19 0725 05/20/19 0833  AST 24 13* 13* 14*  ALT _0 ALKPHOS 187* 171* 185* 164*  BILITOT 0.8 0.8 0.6 0.7  PROT 8.9* 7.4 7.3 7.3  ALBUMIN 4.0 3.2* 3.2* 3.2*   Recent Labs  Lab 05/15/19 0534 05/17/19 1040 05/20/19 0833  LIPASE 150* 106* 26   No results for input(s): AMMONIA in the last 168 hours. Coagulation Profile: No results for input(s): INR, PROTIME in the last 168 hours. Cardiac Enzymes: No results for input(s): CKTOTAL, CKMB, CKMBINDEX, TROPONINI in the last 168 hours. BNP (last 3 results) No results for input(s): PROBNP in the last 8760 hours. HbA1C: No results for input(s): HGBA1C in the last 72 hours. CBG: Recent Labs  Lab 05/18/19 2121 05/19/19 2047 05/19/19 2209 05/20/19 0647 05/20/19 1244  GLUCAP 76 63* 101* 76 66*   Lipid Profile: No results for input(s): CHOL, HDL, LDLCALC, TRIG, CHOLHDL, LDLDIRECT in the last 72 hours. Thyroid Function Tests: No results for input(s): TSH, T4TOTAL, FREET4, T3FREE, THYROIDAB in the last 72 hours. Anemia Panel: No results for input(s): VITAMINB12, FOLATE, FERRITIN, TIBC, IRON, RETICCTPCT in the last 72 hours. Urine analysis:    Component Value Date/Time   COLORURINE YELLOW 10/18/2013 0419   APPEARANCEUR CLEAR 10/18/2013 0419   LABSPEC 1.008 10/18/2013 0419   PHURINE 8.5 (H) 10/18/2013 0419   GLUCOSEU 100 (A) 10/18/2013 0419   HGBUR TRACE (A) 10/18/2013 0419   BILIRUBINUR NEGATIVE 10/18/2013 0419   KETONESUR NEGATIVE 10/18/2013 0419   PROTEINUR 100 (A) 10/18/2013 0419   UROBILINOGEN 0.2 10/18/2013 0419   NITRITE NEGATIVE 10/18/2013 0419   LEUKOCYTESUR NEGATIVE 10/18/2013 0419   Sepsis Labs: _1 (procalcitonin:4,lacticidven:4)  ) Recent Results (from the past 240 hour(s))  SARS CORONAVIRUS 2 (TAT 6-24 HRS) Nasopharyngeal Nasopharyngeal Swab     Status: None   Collection Time: 05/17/19 12:30 PM    Specimen: Nasopharyngeal Swab  Result Value Ref Range Status   SARS Coronavirus 2 NEGATIVE NEGATIVE Final    Comment: (NOTE) SARS-CoV-2 target nucleic acids are NOT DETECTED. The SARS-CoV-2 RNA is generally detectable in upper and lower respiratory specimens during the acute phase of infection. Negative results do not preclude SARS-CoV-2 infection, do not rule out co-infections with other pathogens, and should not be used as the sole basis for treatment or other patient management decisions. Negative results must be combined with clinical observations, patient history, and epidemiological information. The expected result is Negative. Fact Sheet for Patients: SugarRoll.be Fact Sheet for Healthcare Providers: https://www.woods-mathews.com/ This test is not yet approved or cleared by the [place] FDA and  has been authorized for detection and/or diagnosis of SARS-CoV-2 by FDA under an Emergency Use Authorization (EUA). This EUA will remain  in effect (meaning this test can be used) for the duration of the COVID-19 declaration under Section 56 4(b)(1) of the Act, 21 U.S.C. section 360bbb-3(b)(1), unless the authorization is terminated or revoked sooner. Performed at [facility], [place] [address], [place], [place] [address]   MRSA PCR Screening     Status: None   Collection Time: 05/17/19 11:20 PM   Specimen: Nasal Mucosa; Nasopharyngeal  Result Value Ref Range Status   MRSA by PCR NEGATIVE NEGATIVE Final    Comment:        The GeneXpert MRSA Assay (FDA approved for NASAL specimens only), is one component of a comprehensive MRSA colonization surveillance program. It is not intended to diagnose MRSA infection nor to guide or monitor treatment for MRSA infections. Performed at [facility], [place] [address], [place], [place] [address]          Radiology Studies: No results found.      Scheduled Meds: .  amLODipine  5 mg Oral Daily  . apixaban  5 mg Oral BID  . Chlorhexidine Gluconate Cloth  6 each Topical Q0600  . cyclobenzaprine  10 mg Oral TID  . darbepoetin (ARANESP) injection - DIALYSIS  100 mcg Intravenous Q Thu-HD  . hydrALAZINE  100 mg Oral TID  . HYDROmorphone      . lanthanum  1,000 mg Oral TID WC  . linaclotide  144 mcg Oral QAC breakfast  . lisinopril  5 mg Oral Daily  . multivitamin  1 tablet Oral QHS  . pantoprazole  40 mg Oral Daily  . polyethylene glycol  17 g Oral Daily  . senna-docusate  1 tablet Oral BID  . sodium chloride flush  3 mL Intravenous Once  . sodium chloride flush  3 mL Intravenous Q12H   Continuous Infusions: . sodium chloride    . sodium thiosulfate infusion for calciphylaxis 25 g (05/20/19 1036)     LOS: 2 days    Time spent: 56mn    PDomenic Polite MD [facility] Page via [email or web address], password [email or web address] After 7PM please contact night-coverage  05/20/2019, 2:17 PM

## 2019-05-20 NOTE — Procedures (Signed)
   I was present at this dialysis session, have reviewed the session itself and made  appropriate changes Kelly Splinter MD [place] pager [phone]   05/20/2019, 10:15 AM

## 2019-05-20 NOTE — Progress Notes (Signed)
Pt left the unit and walked to the cafeteria without notifying staff. Pt returned with meal. Pt denies any nausea and is able to tolerate meal. Pt educated on renal diet and know that he can not leave the floor since he does not have an order to leave floor. Pt states that he will leave tomorrow by 0900. MD Jospeh aware.  Paulla Fore, RN

## 2019-05-20 NOTE — Progress Notes (Signed)
Late entry:  Pt in HD.

## 2019-05-20 NOTE — Plan of Care (Signed)
  Problem: Education: Goal: Knowledge of General Education information will improve Description: Including pain rating scale, medication(s)/side effects and non-pharmacologic comfort measures Outcome: Progressing

## 2019-05-20 NOTE — Progress Notes (Addendum)
[facility] Progress Note   Subjective:   Patient seen and examined at bedside. States he had n/v following breakfast today.  No SOB, CP, vomiting or diarrhea.  Objective Vitals:   05/20/19 0734 05/20/19 0800 05/20/19 0830 05/20/19 0900  BP: (!) 164/94 (!) 197/96 (!) 216/115 (!) (P) 210/108  Pulse: 85 79 88 (P) 81  Resp:      Temp:      TempSrc:      SpO2:      Weight:       Physical Exam General:NAD, well appearing male, laying in bed Heart:RRR Lungs:CTAB Abdomen:soft, NTND, +BS Extremities:no LE edema Dialysis Access: LU AVF accessed   [facility] Weights   05/19/19 0700 05/19/19 2047 05/20/19 0719  Weight: 75.7 kg 75.7 kg 75.3 kg    Intake/Output Summary (Last 24 hours) at 05/20/2019 0953 Last data filed at 05/20/2019 0600 Gross per 24 hour  Intake 957 ml  Output 1400 ml  Net -443 ml    Additional Objective Labs: Basic Metabolic Panel: Recent Labs  Lab 05/17/19 1040 05/19/19 0725 05/20/19 0833  NA 138 132* 137  K 6.4* 5.6* 4.9  CL 99 95* 95*  CO2 18* 22 22  GLUCOSE 74 107* 80  BUN 94* 66* 39*  CREATININE 19.46* 14.78* 10.32*  CALCIUM 7.6* 8.0* 8.6*  PHOS  --   --  5.1*   Liver Function Tests: Recent Labs  Lab 05/17/19 1040 05/19/19 0725 05/20/19 0833  AST 13* 13* 14*  ALT _0 ALKPHOS 171* 185* 164*  BILITOT 0.8 0.6 0.7  PROT 7.4 7.3 7.3  ALBUMIN 3.2* 3.2* 3.2*   Recent Labs  Lab 05/15/19 0534 05/17/19 1040 05/20/19 0833  LIPASE 150* 106* 26   CBC: Recent Labs  Lab 05/15/19 0534 05/17/19 0848 05/19/19 0500 05/20/19 0833  WBC 5.0 4.7 4.7 3.6*  HGB 11.5* 12.7* 10.0* 9.6*  HCT 38.1* 41.7 31.4* 30.0*  MCV 91.4 93.3 88.7 88.5  PLT 164 149* 139* 125*   Blood Culture    Component Value Date/Time   SDES PLEURAL 07/29/2018 1300   SDES PLEURAL 07/29/2018 1300   SPECREQUEST NONE 07/29/2018 1300   SPECREQUEST NONE 07/29/2018 1300   CULT  07/29/2018 1300    NO GROWTH 5 DAYS Performed at [facility],  [place] [address] [place], [place] [address]    REPTSTATUS 08/03/2018 FINAL 07/29/2018 1300   REPTSTATUS 07/29/2018 FINAL 07/29/2018 1300    Cardiac Enzymes: No results for input(s): CKTOTAL, CKMB, CKMBINDEX, TROPONINI in the last 168 hours. CBG: Recent Labs  Lab 05/17/19 2124 05/18/19 2121 05/19/19 2047 05/19/19 2209 05/20/19 0647  GLUCAP 104* 76 63* 101* 76   Iron Studies: No results for input(s): IRON, TIBC, TRANSFERRIN, FERRITIN in the last 72 hours. Lab Results  Component Value Date   INR 1.39 07/28/2018   INR 1.36 04/26/2018   INR 1.2 (A) 01/15/2018   Studies/Results: Dg Abd 1 View  Result Date: 05/18/2019 CLINICAL DATA:  Acute right-sided abdominal pain. EXAM: ABDOMEN - 1 VIEW COMPARISON:  May 06, 2019.  April 19, 2019. FINDINGS: The bowel gas pattern is normal. No radio-opaque calculi or other significant radiographic abnormality are seen. IMPRESSION: No evidence of bowel obstruction or ileus. Aortic Atherosclerosis (ICD10-I70.0). Electronically Signed   By: Marijo Conception M.D.   On: 05/18/2019 13:29    Medications: . sodium chloride    . sodium chloride    . sodium chloride    . sodium thiosulfate infusion for  calciphylaxis Stopped (05/19/19 1010)   . amLODipine  5 mg Oral Daily  . apixaban  5 mg Oral BID  . Chlorhexidine Gluconate Cloth  6 each Topical Q0600  . cyclobenzaprine  10 mg Oral TID  . hydrALAZINE  100 mg Oral TID  . HYDROmorphone      . lanthanum  1,000 mg Oral TID WC  . linaclotide  144 mcg Oral QAC breakfast  . lisinopril  5 mg Oral Daily  . multivitamin  1 tablet Oral QHS  . pantoprazole  40 mg Oral Daily  . sodium chloride flush  3 mL Intravenous Once  . sodium chloride flush  3 mL Intravenous Q12H    Dialysis Orders: Outpt HD: TTS [place] 4h 400/800 72.5kg 2/2.25 bath AVF No heparin - parsabiv 5 mg tiw - mircera 200 ug every 2 wks, last 10/13 - sod thiosulfate 25 gm tiw IV  Home meds: - amlodipine 5 qd/ hydralazine  100 tid/ lisinopril 5 qd - omeprazole20 qd - apixaban 5 bid - oxycodone IR 32m prn/ ondansatron 431mtid prn - sl ntg prn - prn's/ vitamins/ supplements  Assessment/Plan: 1. Acute / chronicpancreatitis- w/ abd pain, nausea/ vomiting. Per primary 2. ESRD - on HD TTS. HD today per regular schedule. K 4.9, use 2K bath. 3. Hyperkalemia -K+ 4.9, HD today. 4. HTN -improved post HD. Elevated today, continue home BP meds post dialysis. 5. Volume -Volume status improved. Standing weight post to assess.  Should meet EDW today. 6. H/o DVT/ PE - cont eliquis 7. Anemia - Hgb 9.6. Aranesp 100 mcg today.  8. Metabolic bone disease - CCa a little low. Hx calciphalyxis, no Ca/Vit D meds. Check phos.  9. Nutrition - renal diet w/fluid restrictions, vit   LiJen MowPA-C [facility] Pager: [phone]/29/2020,9:53 AM  LOS: 2 days   Pt seen, examined and agree w A/P as above.  RoKelly SplinterMD 05/20/2019, 10:15 AM

## 2019-05-21 LAB — GLUCOSE, CAPILLARY: Glucose-Capillary: 109 mg/dL — ABNORMAL HIGH (ref 70–99)

## 2019-05-21 NOTE — Progress Notes (Signed)
Hypoglycemic Event  CBG: 64 @ 2116 05/20/19  Treatment: given apple juice,1 bottle nepro supplement, 48fuit cup  Symptoms: asymptomatic  Follow-up CBG: Time: 22:25 CBG Result: 103  Possible Reasons for Event: unknown  Comments/MD notified:   JBabs Sciara

## 2019-05-21 NOTE — Progress Notes (Signed)
Pt has signed out AMA. MD [name] has been notified.   Paulla Fore, RN, BSN

## 2019-05-21 NOTE — Consult Note (Signed)
[facility] CM Inpatient Consult   05/21/2019  GENTRY PILSON Nov 21, 1965 [id number]     Patientscreenedfor less than 7 day readmission with 5 hospitalizations and high ED utilization (x 17) in the past 6 months, and to check if potential [place] [facility] services are needed underhis Medicare Next/Genplan.   Patient still has no primary care provider noted.  Call made and spoke briefly with transition of care CM regarding patient still not having a primary care provider and possibly offering him one, and states she could schedule a primary care provider follow-up upon discharge.   Per chart further review, RN documented that patient has signed out and left AMA (Against Medical Advice) earlier today.  He is currently NOT a beneficiary of the attributed [place] in the [employer] and he is currently Notcovered for [place].  Reason:He has NO current primary care provider.  Will sign off.   For additional information and questions, please call:  Jaylise Peek A. Williette Loewe, BSN, RN-BC [facility] Liaison Cell: [phone]

## 2019-05-22 ENCOUNTER — Emergency Department (HOSPITAL_COMMUNITY)
Admission: EM | Admit: 2019-05-22 | Discharge: 2019-05-22 | Disposition: A | Discharge status: Home / Self Care | Payer: Medicare Other | Attending: Emergency Medicine | Admitting: Emergency Medicine

## 2019-05-22 ENCOUNTER — Encounter (HOSPITAL_COMMUNITY): Payer: Self-pay | Admitting: Emergency Medicine

## 2019-05-22 ENCOUNTER — Other Ambulatory Visit: Payer: Self-pay

## 2019-05-22 DIAGNOSIS — I5022 Chronic systolic (congestive) heart failure: Secondary | ICD-10-CM | POA: Diagnosis not present

## 2019-05-22 DIAGNOSIS — N186 End stage renal disease: Secondary | ICD-10-CM | POA: Insufficient documentation

## 2019-05-22 DIAGNOSIS — I132 Hypertensive heart and chronic kidney disease with heart failure and with stage 5 chronic kidney disease, or end stage renal disease: Secondary | ICD-10-CM | POA: Diagnosis not present

## 2019-05-22 DIAGNOSIS — Z79899 Other long term (current) drug therapy: Secondary | ICD-10-CM | POA: Diagnosis not present

## 2019-05-22 DIAGNOSIS — R1110 Vomiting, unspecified: Secondary | ICD-10-CM | POA: Insufficient documentation

## 2019-05-22 DIAGNOSIS — Z992 Dependence on renal dialysis: Secondary | ICD-10-CM | POA: Diagnosis not present

## 2019-05-22 DIAGNOSIS — Z7901 Long term (current) use of anticoagulants: Secondary | ICD-10-CM | POA: Diagnosis not present

## 2019-05-22 DIAGNOSIS — D509 Iron deficiency anemia, unspecified: Secondary | ICD-10-CM | POA: Diagnosis not present

## 2019-05-22 DIAGNOSIS — D631 Anemia in chronic kidney disease: Secondary | ICD-10-CM | POA: Diagnosis not present

## 2019-05-22 DIAGNOSIS — R1084 Generalized abdominal pain: Secondary | ICD-10-CM | POA: Insufficient documentation

## 2019-05-22 DIAGNOSIS — E875 Hyperkalemia: Secondary | ICD-10-CM | POA: Diagnosis not present

## 2019-05-22 DIAGNOSIS — E1122 Type 2 diabetes mellitus with diabetic chronic kidney disease: Secondary | ICD-10-CM | POA: Diagnosis not present

## 2019-05-22 DIAGNOSIS — Z87891 Personal history of nicotine dependence: Secondary | ICD-10-CM | POA: Diagnosis not present

## 2019-05-22 DIAGNOSIS — N2581 Secondary hyperparathyroidism of renal origin: Secondary | ICD-10-CM | POA: Diagnosis not present

## 2019-05-22 LAB — CBC
HCT: 39 % (ref 39.0–52.0)
Hemoglobin: 12.5 g/dL — ABNORMAL LOW (ref 13.0–17.0)
MCH: 28.4 pg (ref 26.0–34.0)
MCHC: 32.1 g/dL (ref 30.0–36.0)
MCV: 88.6 fL (ref 80.0–100.0)
Platelets: 141 10*3/uL — ABNORMAL LOW (ref 150–400)
RBC: 4.4 MIL/uL (ref 4.22–5.81)
RDW: 16.4 % — ABNORMAL HIGH (ref 11.5–15.5)
WBC: 4.6 10*3/uL (ref 4.0–10.5)
nRBC: 0 % (ref 0.0–0.2)

## 2019-05-22 LAB — COMPREHENSIVE METABOLIC PANEL
ALT: 12 U/L (ref 0–44)
AST: 19 U/L (ref 15–41)
Albumin: 3.8 g/dL (ref 3.5–5.0)
Alkaline Phosphatase: 204 U/L — ABNORMAL HIGH (ref 38–126)
Anion gap: 18 — ABNORMAL HIGH (ref 5–15)
BUN: 27 mg/dL — ABNORMAL HIGH (ref 6–20)
CO2: 26 mmol/L (ref 22–32)
Calcium: 8.8 mg/dL — ABNORMAL LOW (ref 8.9–10.3)
Chloride: 94 mmol/L — ABNORMAL LOW (ref 98–111)
Creatinine, Ser: 6.21 mg/dL — ABNORMAL HIGH (ref 0.61–1.24)
GFR calc Af Amer: 11 mL/min — ABNORMAL LOW (ref >60)
GFR calc non Af Amer: 9 mL/min — ABNORMAL LOW (ref >60)
Glucose, Bld: 78 mg/dL (ref 70–99)
Potassium: 4 mmol/L (ref 3.5–5.1)
Sodium: 138 mmol/L (ref 135–145)
Total Bilirubin: 0.6 mg/dL (ref 0.3–1.2)
Total Protein: 8.9 g/dL — ABNORMAL HIGH (ref 6.5–8.1)

## 2019-05-22 LAB — LIPASE, BLOOD: Lipase: 47 U/L (ref 11–51)

## 2019-05-22 LAB — CBG MONITORING, ED: Glucose-Capillary: 68 mg/dL — ABNORMAL LOW (ref 70–99)

## 2019-05-22 MED ORDER — HYDROMORPHONE HCL 1 MG/ML IJ SOLN
1.0000 mg | Freq: Once | INTRAMUSCULAR | Status: AC
  Administered 2019-05-22: 1 mg via INTRAMUSCULAR

## 2019-05-22 MED ORDER — PROMETHAZINE HCL 25 MG/ML IJ SOLN
12.5000 mg | Freq: Once | INTRAMUSCULAR | Status: DC
  Filled 2019-05-22: qty 1

## 2019-05-22 MED ORDER — SODIUM CHLORIDE 0.9% FLUSH: 3.0000 mL | Freq: Once | INTRAVENOUS | Status: DC

## 2019-05-22 MED ORDER — PROMETHAZINE HCL 25 MG RE SUPP: 25.0000 mg | Freq: Four times a day (QID) | RECTAL | 1 refills | Status: DC | PRN

## 2019-05-22 MED ORDER — PROMETHAZINE HCL 25 MG/ML IJ SOLN
25.0000 mg | Freq: Once | INTRAMUSCULAR | Status: AC
  Administered 2019-05-22: 15:00:00 25 mg via INTRAMUSCULAR

## 2019-05-22 MED ORDER — HYDROMORPHONE HCL 1 MG/ML IJ SOLN
0.5000 mg | Freq: Once | INTRAMUSCULAR | Status: DC
  Filled 2019-05-22: qty 1

## 2019-05-22 MED ORDER — HYDROMORPHONE HCL 1 MG/ML IJ SOLN
1.0000 mg | Freq: Once | INTRAMUSCULAR | Status: AC
  Administered 2019-05-22: 1 mg via INTRAMUSCULAR
  Filled 2019-05-22: qty 1

## 2019-05-22 NOTE — ED Notes (Signed)
Pt verbalized understanding of d/c instructions and has no further questions, VSS, NAD.  

## 2019-05-22 NOTE — Discharge Instructions (Addendum)
When you go to dialysis tell them you are concerned about too much fluid taken off.  Follow-up next week with your doctor for recheck.  Take all your medicines that are prescribed.

## 2019-05-22 NOTE — ED Triage Notes (Signed)
Pt states he was discharged yesterday and went to dialysis today.  Reports pancreatitis flare-up with abd pain and vomiting.  States he was only able to complete 3 hours of dialysis.  Request labs be obtained once in treatment room.  Requesting CBG on arrival.
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
## 2019-05-23 ENCOUNTER — Telehealth: Payer: Self-pay | Admitting: Physician Assistant

## 2019-05-23 DIAGNOSIS — T8610 Unspecified complication of kidney transplant: Secondary | ICD-10-CM | POA: Diagnosis not present

## 2019-05-23 DIAGNOSIS — N186 End stage renal disease: Secondary | ICD-10-CM | POA: Diagnosis not present

## 2019-05-23 DIAGNOSIS — Z992 Dependence on renal dialysis: Secondary | ICD-10-CM | POA: Diagnosis not present

## 2019-05-23 NOTE — Telephone Encounter (Signed)
Transition of care contact from inpatient facility  Date of discharge: 05/22/2019 Date of contact: 05/23/2019 Method of contact: Phone  Attempted to contact patient to discuss transition of care from inpatient admission. Patient did not answer the phone. Unable to leave message on either phone number.

## 2019-05-24 ENCOUNTER — Telehealth: Payer: Self-pay | Admitting: Physician Assistant

## 2019-05-24 DIAGNOSIS — M545 Low back pain: Secondary | ICD-10-CM | POA: Diagnosis not present

## 2019-05-24 DIAGNOSIS — S41101S Unspecified open wound of right upper arm, sequela: Secondary | ICD-10-CM | POA: Diagnosis not present

## 2019-05-24 DIAGNOSIS — Z79891 Long term (current) use of opiate analgesic: Secondary | ICD-10-CM | POA: Diagnosis not present

## 2019-05-24 DIAGNOSIS — G8929 Other chronic pain: Secondary | ICD-10-CM | POA: Diagnosis not present

## 2019-05-24 DIAGNOSIS — Z79899 Other long term (current) drug therapy: Secondary | ICD-10-CM | POA: Diagnosis not present

## 2019-05-24 NOTE — Telephone Encounter (Signed)
Transition of care contact from inpatient facility  Date of discharge: 05/22/2019 Date of contact: 05/24/2019 Method of contact: Phone  Attempted to contact patient to discuss transition of care from inpatient admission. Patient did not answer the phone. Unable to leave message.

## 2019-05-26 ENCOUNTER — Encounter: Payer: Medicare Other | Admitting: Vascular Surgery

## 2019-05-27 ENCOUNTER — Inpatient Hospital Stay (HOSPITAL_COMMUNITY)
Admission: EM | Admit: 2019-05-27 | Discharge: 2019-05-30 | DRG: 438 | Disposition: A | Discharge status: Home / Self Care | Payer: Medicare Other | Attending: Internal Medicine | Admitting: Internal Medicine

## 2019-05-27 ENCOUNTER — Other Ambulatory Visit: Payer: Self-pay

## 2019-05-27 ENCOUNTER — Encounter (HOSPITAL_COMMUNITY): Payer: Self-pay | Admitting: Emergency Medicine

## 2019-05-27 DIAGNOSIS — I428 Other cardiomyopathies: Secondary | ICD-10-CM | POA: Diagnosis present

## 2019-05-27 DIAGNOSIS — K859 Acute pancreatitis without necrosis or infection, unspecified: Principal | ICD-10-CM | POA: Diagnosis present

## 2019-05-27 DIAGNOSIS — IMO0002 Reserved for concepts with insufficient information to code with codable children: Secondary | ICD-10-CM | POA: Diagnosis present

## 2019-05-27 DIAGNOSIS — E1122 Type 2 diabetes mellitus with diabetic chronic kidney disease: Secondary | ICD-10-CM | POA: Diagnosis present

## 2019-05-27 DIAGNOSIS — Z888 Allergy status to other drugs, medicaments and biological substances status: Secondary | ICD-10-CM

## 2019-05-27 DIAGNOSIS — Z992 Dependence on renal dialysis: Secondary | ICD-10-CM

## 2019-05-27 DIAGNOSIS — K861 Other chronic pancreatitis: Secondary | ICD-10-CM | POA: Diagnosis not present

## 2019-05-27 DIAGNOSIS — D631 Anemia in chronic kidney disease: Secondary | ICD-10-CM | POA: Diagnosis present

## 2019-05-27 DIAGNOSIS — I5042 Chronic combined systolic (congestive) and diastolic (congestive) heart failure: Secondary | ICD-10-CM | POA: Diagnosis not present

## 2019-05-27 DIAGNOSIS — Z03818 Encounter for observation for suspected exposure to other biological agents ruled out: Secondary | ICD-10-CM | POA: Diagnosis not present

## 2019-05-27 DIAGNOSIS — Z20828 Contact with and (suspected) exposure to other viral communicable diseases: Secondary | ICD-10-CM | POA: Diagnosis not present

## 2019-05-27 DIAGNOSIS — I120 Hypertensive chronic kidney disease with stage 5 chronic kidney disease or end stage renal disease: Secondary | ICD-10-CM | POA: Diagnosis not present

## 2019-05-27 DIAGNOSIS — Z86711 Personal history of pulmonary embolism: Secondary | ICD-10-CM

## 2019-05-27 DIAGNOSIS — I2699 Other pulmonary embolism without acute cor pulmonale: Secondary | ICD-10-CM | POA: Diagnosis present

## 2019-05-27 DIAGNOSIS — I132 Hypertensive heart and chronic kidney disease with heart failure and with stage 5 chronic kidney disease, or end stage renal disease: Secondary | ICD-10-CM | POA: Diagnosis not present

## 2019-05-27 DIAGNOSIS — N186 End stage renal disease: Secondary | ICD-10-CM | POA: Diagnosis present

## 2019-05-27 DIAGNOSIS — M898X9 Other specified disorders of bone, unspecified site: Secondary | ICD-10-CM | POA: Diagnosis present

## 2019-05-27 DIAGNOSIS — E875 Hyperkalemia: Secondary | ICD-10-CM | POA: Diagnosis not present

## 2019-05-27 DIAGNOSIS — K219 Gastro-esophageal reflux disease without esophagitis: Secondary | ICD-10-CM | POA: Diagnosis present

## 2019-05-27 DIAGNOSIS — Z87891 Personal history of nicotine dependence: Secondary | ICD-10-CM

## 2019-05-27 DIAGNOSIS — N2581 Secondary hyperparathyroidism of renal origin: Secondary | ICD-10-CM | POA: Diagnosis present

## 2019-05-27 DIAGNOSIS — Z7901 Long term (current) use of anticoagulants: Secondary | ICD-10-CM

## 2019-05-27 DIAGNOSIS — I160 Hypertensive urgency: Secondary | ICD-10-CM | POA: Diagnosis present

## 2019-05-27 DIAGNOSIS — K7460 Unspecified cirrhosis of liver: Secondary | ICD-10-CM | POA: Diagnosis present

## 2019-05-27 DIAGNOSIS — E1159 Type 2 diabetes mellitus with other circulatory complications: Secondary | ICD-10-CM | POA: Diagnosis present

## 2019-05-27 DIAGNOSIS — I700 Atherosclerosis of aorta: Secondary | ICD-10-CM | POA: Diagnosis present

## 2019-05-27 DIAGNOSIS — Z886 Allergy status to analgesic agent status: Secondary | ICD-10-CM

## 2019-05-27 DIAGNOSIS — G4730 Sleep apnea, unspecified: Secondary | ICD-10-CM | POA: Diagnosis present

## 2019-05-27 DIAGNOSIS — E1129 Type 2 diabetes mellitus with other diabetic kidney complication: Secondary | ICD-10-CM | POA: Diagnosis present

## 2019-05-27 DIAGNOSIS — Z885 Allergy status to narcotic agent status: Secondary | ICD-10-CM

## 2019-05-27 DIAGNOSIS — I272 Pulmonary hypertension, unspecified: Secondary | ICD-10-CM | POA: Diagnosis present

## 2019-05-27 DIAGNOSIS — R109 Unspecified abdominal pain: Secondary | ICD-10-CM

## 2019-05-27 DIAGNOSIS — I10 Essential (primary) hypertension: Secondary | ICD-10-CM | POA: Diagnosis present

## 2019-05-27 IMAGING — CT CT ABD-PELV W/ CM
2 of 5 series · 16 of 46 positions shown, 18 images · IV contrast (iopamidol)
Comparison: CT of the abdomen and pelvis performed 09/27/2016, and
MRI of the lumbar spine performed 10/07/2016

CLINICAL DATA: Acute onset of generalized abdominal pain, nausea
and vomiting. Initial encounter.

EXAM:
CT ABDOMEN AND PELVIS WITH CONTRAST
TECHNIQUE: Multidetector CT imaging of the abdomen and pelvis was performed
using the standard protocol following bolus administration of
intravenous contrast.
CONTRAST:  100mL 05MIB1-7LL IOPAMIDOL (05MIB1-7LL) INJECTION 61%

[Series 3: a/p w/ 5mm · axial · 0.67mm/px · z∈[-1086,-701]mm · 13 of 87 slices shown, 15 images]
[im 5/87  soft-tissue]
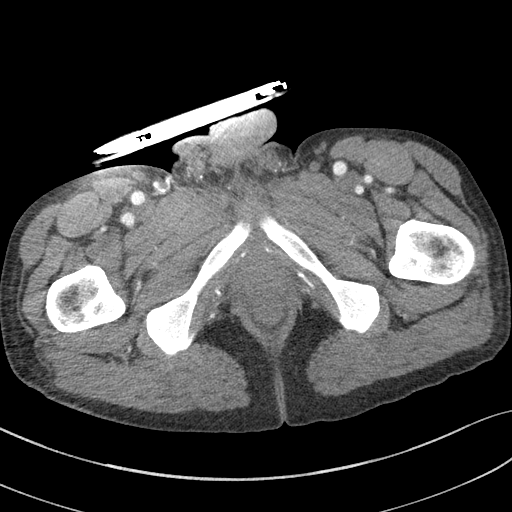
[im 5/87  bone]
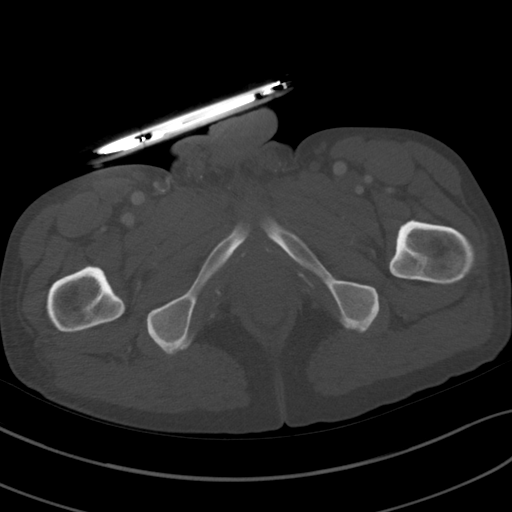
[im 13/87  soft-tissue]
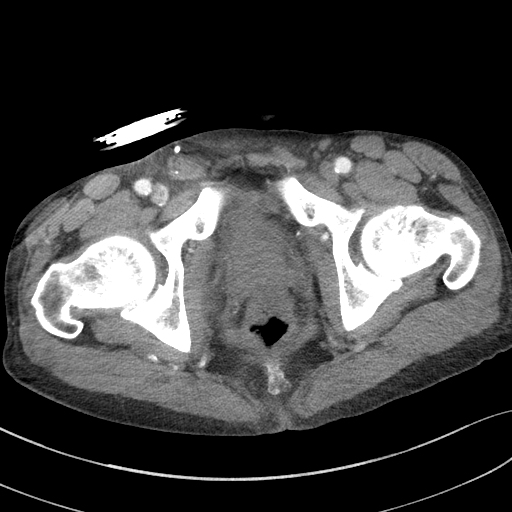
[im 18/87  soft-tissue]
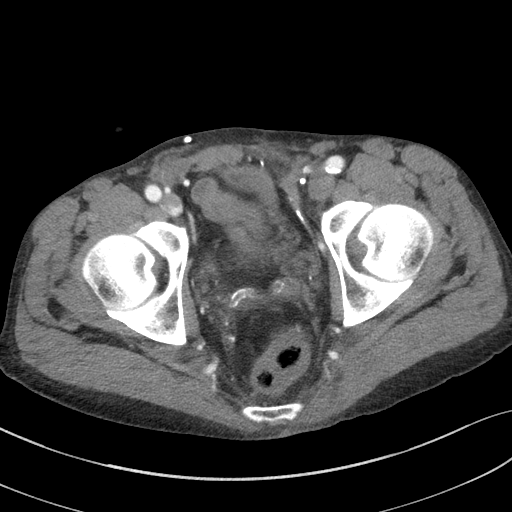
[im 26/87  soft-tissue]
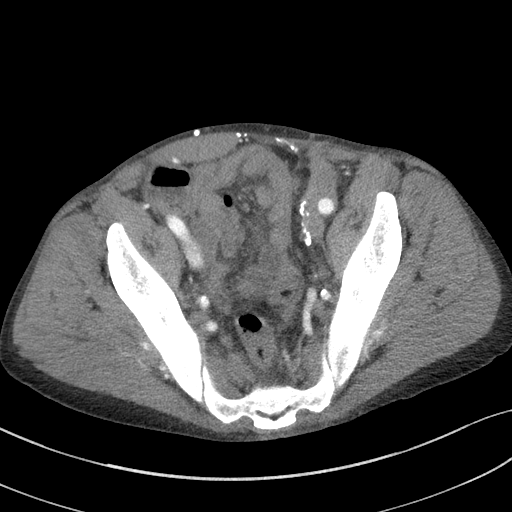
[im 31/87  soft-tissue]
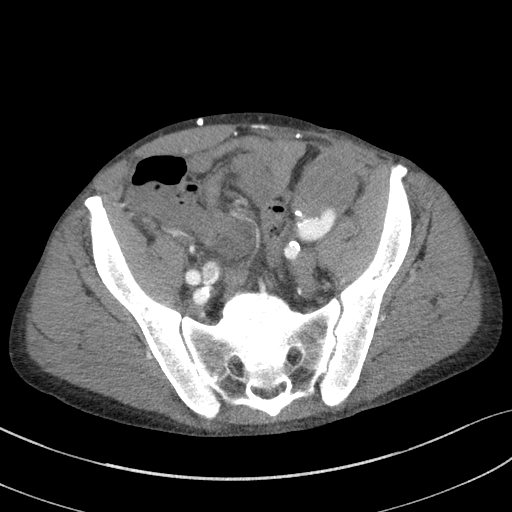
[im 39/87  soft-tissue]
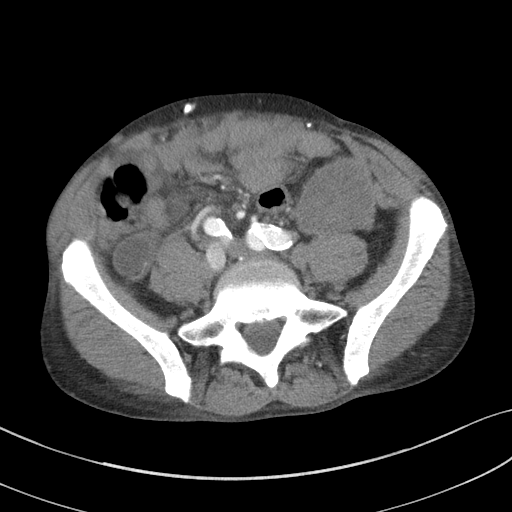
[im 44/87  soft-tissue]
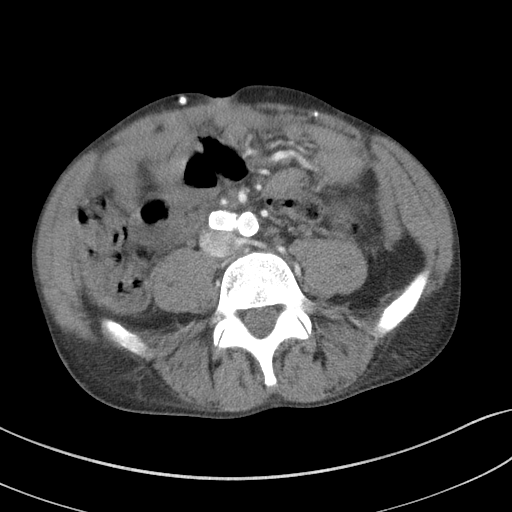
[im 48/87  soft-tissue]
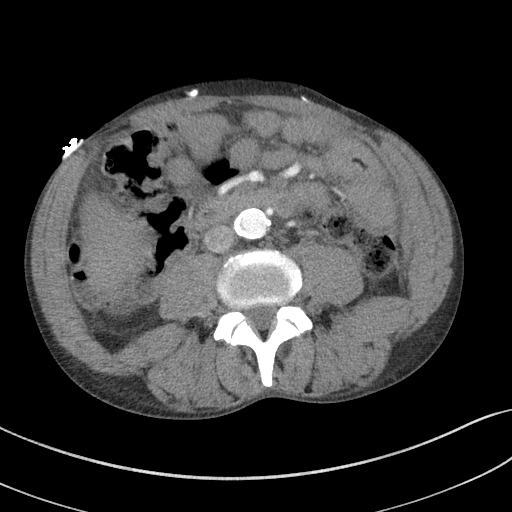
[im 56/87  soft-tissue]
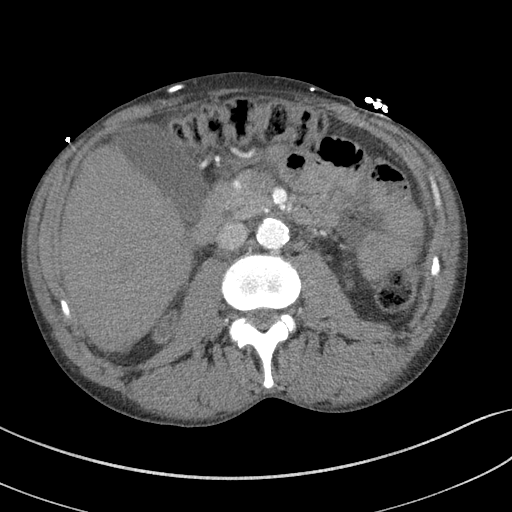
[im 56/87  bone]
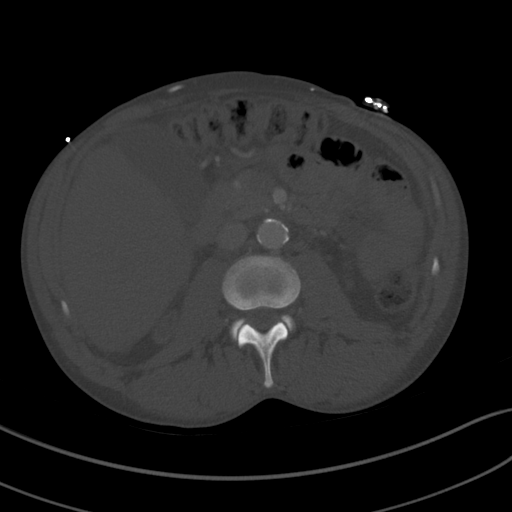
[im 61/87  soft-tissue]
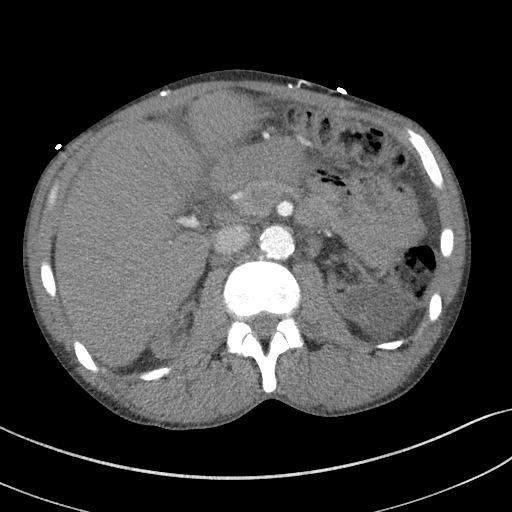
[im 69/87  soft-tissue]
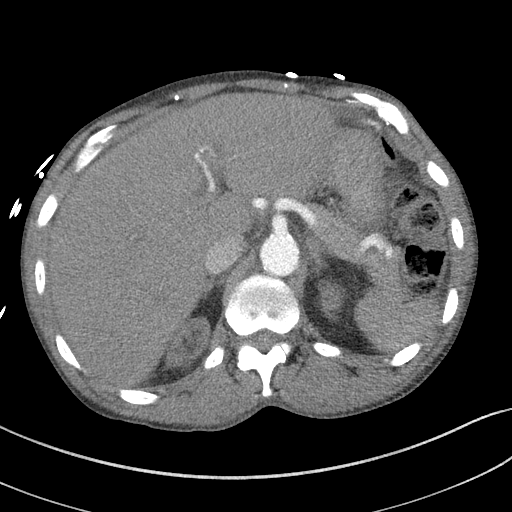
[im 74/87  soft-tissue]
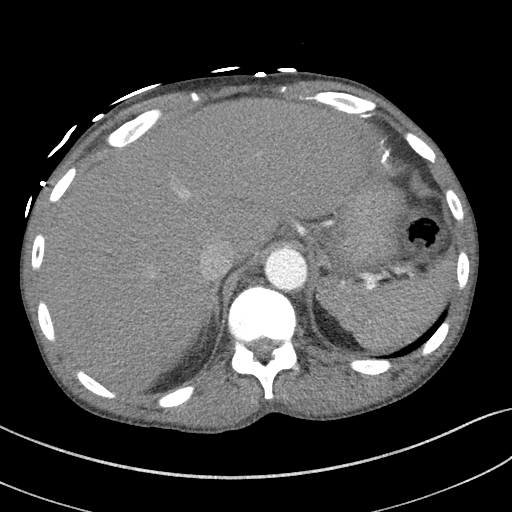
[im 82/87  soft-tissue]
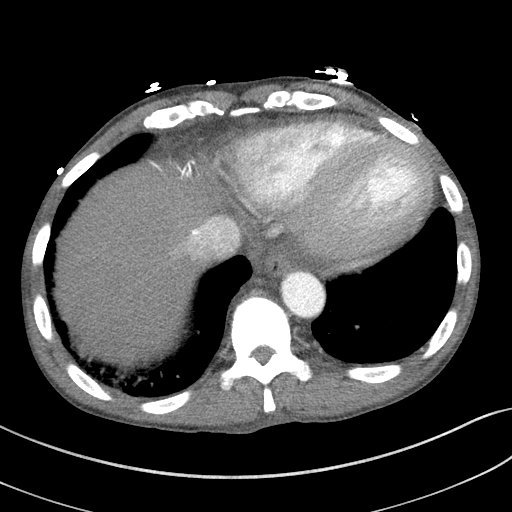

[Series 6: a/p w/ cor · coronal · 0.78mm/px · 3 of 150 slices shown]
[im 50/150  soft-tissue]
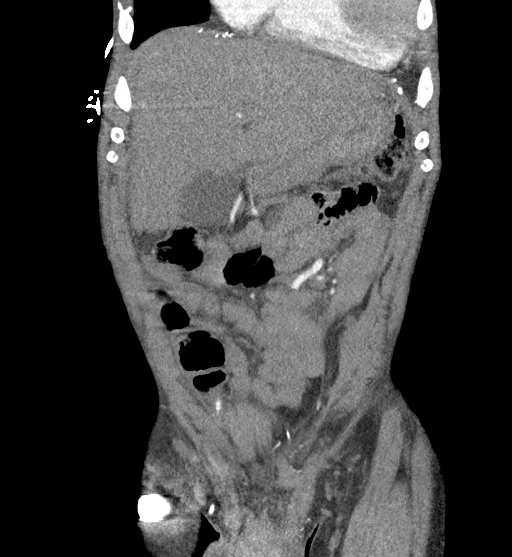
[im 67/150  soft-tissue]
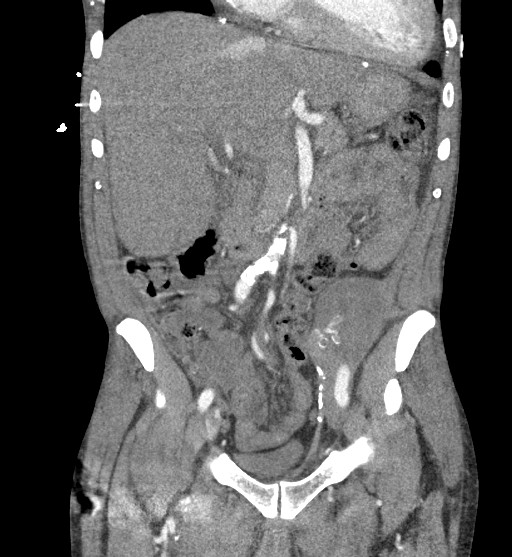
[im 83/150  soft-tissue]
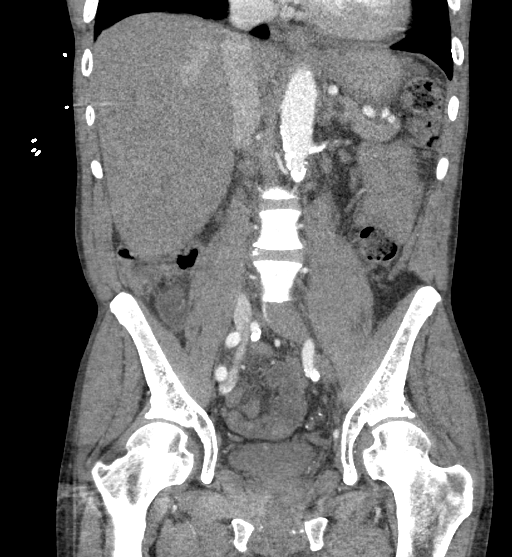

[16 of 46 positions shown; findings below may reference images not displayed]

FINDINGS: Lower chest: The heart is mildly enlarged. Scattered coronary artery
calcification is seen. Minimal right basilar atelectasis is noted.

Hepatobiliary: The liver is unremarkable in appearance. The
gallbladder is unremarkable in appearance. The common bile duct
remains normal in caliber.

Pancreas: The pancreas is within normal limits.

Spleen: The spleen is unremarkable in appearance.

Adrenals/Urinary Tract: The adrenal glands are unremarkable in
appearance.

There is severe chronic bilateral renal atrophy, with large left
renal cysts. Mild nonspecific perinephric stranding is noted
bilaterally. There is no evidence of hydronephrosis. No renal or
ureteral stones are identified.

There is poor visualization of a left iliac fossa transplant kidney.

Stomach/Bowel: The stomach is unremarkable in appearance. The small
bowel is within normal limits. The appendix is normal in caliber,
without evidence of appendicitis. The colon is unremarkable in
appearance.

Vascular/Lymphatic: Diffuse calcification is seen along the
abdominal aorta and its branches. The abdominal aorta is otherwise
grossly unremarkable. The inferior vena cava is grossly
unremarkable. No retroperitoneal lymphadenopathy is seen. No pelvic
sidewall lymphadenopathy is identified.

Reproductive: The bladder is decompressed and not well assessed. The
prostate remains normal in size.

Other: No additional soft tissue abnormalities are seen.

Musculoskeletal: No acute osseous abnormalities are identified. Mild
underlying renal osteodystrophy is noted. The visualized musculature
is unremarkable in appearance.
IMPRESSION: 1. No acute abnormality seen to explain the patient's symptoms.
2. Mild cardiomegaly. Scattered coronary artery calcifications seen.
3. Severe chronic bilateral renal atrophy, with large left renal
cysts. Left iliac fossa transplant kidney is not well characterized.
4. Diffuse aortic atherosclerosis.
5. Underlying renal osteodystrophy noted.

## 2019-05-27 MED ORDER — SODIUM CHLORIDE 0.9% FLUSH: 3.0000 mL | Freq: Once | INTRAVENOUS | Status: DC

## 2019-05-27 NOTE — ED Triage Notes (Signed)
Patient is complaining of vomiting and not feeling good. Patient has a hx of pancreatitis. Patient states he has no other symptoms. Patient is a difficult stick. He has a fistula on the left arm. He is only going to let us stick him once.

## 2019-05-27 NOTE — ED Notes (Addendum)
Attempted to obtain IV access unsuccessfully. IV team consult placed.

## 2019-05-28 ENCOUNTER — Inpatient Hospital Stay (HOSPITAL_COMMUNITY): Payer: Medicare Other

## 2019-05-28 ENCOUNTER — Encounter (HOSPITAL_COMMUNITY): Payer: Self-pay | Admitting: Internal Medicine

## 2019-05-28 DIAGNOSIS — I272 Pulmonary hypertension, unspecified: Secondary | ICD-10-CM | POA: Diagnosis present

## 2019-05-28 DIAGNOSIS — E875 Hyperkalemia: Secondary | ICD-10-CM

## 2019-05-28 DIAGNOSIS — K859 Acute pancreatitis without necrosis or infection, unspecified: Secondary | ICD-10-CM

## 2019-05-28 DIAGNOSIS — Z86711 Personal history of pulmonary embolism: Secondary | ICD-10-CM | POA: Diagnosis not present

## 2019-05-28 DIAGNOSIS — N2581 Secondary hyperparathyroidism of renal origin: Secondary | ICD-10-CM | POA: Diagnosis not present

## 2019-05-28 DIAGNOSIS — Z87891 Personal history of nicotine dependence: Secondary | ICD-10-CM | POA: Diagnosis not present

## 2019-05-28 DIAGNOSIS — K7460 Unspecified cirrhosis of liver: Secondary | ICD-10-CM | POA: Diagnosis present

## 2019-05-28 DIAGNOSIS — Z885 Allergy status to narcotic agent status: Secondary | ICD-10-CM | POA: Diagnosis not present

## 2019-05-28 DIAGNOSIS — I160 Hypertensive urgency: Secondary | ICD-10-CM | POA: Diagnosis present

## 2019-05-28 DIAGNOSIS — I428 Other cardiomyopathies: Secondary | ICD-10-CM | POA: Diagnosis present

## 2019-05-28 DIAGNOSIS — I10 Essential (primary) hypertension: Secondary | ICD-10-CM

## 2019-05-28 DIAGNOSIS — N186 End stage renal disease: Secondary | ICD-10-CM

## 2019-05-28 DIAGNOSIS — I700 Atherosclerosis of aorta: Secondary | ICD-10-CM | POA: Diagnosis present

## 2019-05-28 DIAGNOSIS — E1165 Type 2 diabetes mellitus with hyperglycemia: Secondary | ICD-10-CM

## 2019-05-28 DIAGNOSIS — I5042 Chronic combined systolic (congestive) and diastolic (congestive) heart failure: Secondary | ICD-10-CM | POA: Diagnosis present

## 2019-05-28 DIAGNOSIS — D631 Anemia in chronic kidney disease: Secondary | ICD-10-CM | POA: Diagnosis not present

## 2019-05-28 DIAGNOSIS — I132 Hypertensive heart and chronic kidney disease with heart failure and with stage 5 chronic kidney disease, or end stage renal disease: Secondary | ICD-10-CM | POA: Diagnosis present

## 2019-05-28 DIAGNOSIS — K861 Other chronic pancreatitis: Secondary | ICD-10-CM | POA: Diagnosis not present

## 2019-05-28 DIAGNOSIS — Z20828 Contact with and (suspected) exposure to other viral communicable diseases: Secondary | ICD-10-CM | POA: Diagnosis present

## 2019-05-28 DIAGNOSIS — K219 Gastro-esophageal reflux disease without esophagitis: Secondary | ICD-10-CM | POA: Diagnosis present

## 2019-05-28 DIAGNOSIS — Z992 Dependence on renal dialysis: Secondary | ICD-10-CM | POA: Diagnosis not present

## 2019-05-28 DIAGNOSIS — Z886 Allergy status to analgesic agent status: Secondary | ICD-10-CM | POA: Diagnosis not present

## 2019-05-28 DIAGNOSIS — I120 Hypertensive chronic kidney disease with stage 5 chronic kidney disease or end stage renal disease: Secondary | ICD-10-CM | POA: Diagnosis not present

## 2019-05-28 DIAGNOSIS — E1122 Type 2 diabetes mellitus with diabetic chronic kidney disease: Secondary | ICD-10-CM | POA: Diagnosis present

## 2019-05-28 DIAGNOSIS — G4730 Sleep apnea, unspecified: Secondary | ICD-10-CM | POA: Diagnosis present

## 2019-05-28 DIAGNOSIS — E1129 Type 2 diabetes mellitus with other diabetic kidney complication: Secondary | ICD-10-CM

## 2019-05-28 DIAGNOSIS — Z7901 Long term (current) use of anticoagulants: Secondary | ICD-10-CM | POA: Diagnosis not present

## 2019-05-28 DIAGNOSIS — Z888 Allergy status to other drugs, medicaments and biological substances status: Secondary | ICD-10-CM | POA: Diagnosis not present

## 2019-05-28 HISTORY — DX: Acute pancreatitis without necrosis or infection, unspecified: K85.90

## 2019-05-28 HISTORY — DX: Hypertensive urgency: I16.0

## 2019-05-28 LAB — COMPREHENSIVE METABOLIC PANEL
ALT: 12 U/L (ref 0–44)
AST: 20 U/L (ref 15–41)
Albumin: 3.9 g/dL (ref 3.5–5.0)
Alkaline Phosphatase: 227 U/L — ABNORMAL HIGH (ref 38–126)
Anion gap: 23 — ABNORMAL HIGH (ref 5–15)
BUN: 97 mg/dL — ABNORMAL HIGH (ref 6–20)
CO2: 17 mmol/L — ABNORMAL LOW (ref 22–32)
Calcium: 7.9 mg/dL — ABNORMAL LOW (ref 8.9–10.3)
Chloride: 98 mmol/L (ref 98–111)
Creatinine, Ser: 16.9 mg/dL — ABNORMAL HIGH (ref 0.61–1.24)
GFR calc Af Amer: 3 mL/min — ABNORMAL LOW (ref >60)
GFR calc non Af Amer: 3 mL/min — ABNORMAL LOW (ref >60)
Glucose, Bld: 75 mg/dL (ref 70–99)
Potassium: 6.1 mmol/L — ABNORMAL HIGH (ref 3.5–5.1)
Sodium: 138 mmol/L (ref 135–145)
Total Bilirubin: 1 mg/dL (ref 0.3–1.2)
Total Protein: 8.4 g/dL — ABNORMAL HIGH (ref 6.5–8.1)

## 2019-05-28 LAB — CBC
HCT: 35.8 % — ABNORMAL LOW (ref 39.0–52.0)
Hemoglobin: 10.8 g/dL — ABNORMAL LOW (ref 13.0–17.0)
MCH: 27.5 pg (ref 26.0–34.0)
MCHC: 30.2 g/dL (ref 30.0–36.0)
MCV: 91.1 fL (ref 80.0–100.0)
Platelets: 189 10*3/uL (ref 150–400)
RBC: 3.93 MIL/uL — ABNORMAL LOW (ref 4.22–5.81)
RDW: 16.6 % — ABNORMAL HIGH (ref 11.5–15.5)
WBC: 4 10*3/uL (ref 4.0–10.5)
nRBC: 0 % (ref 0.0–0.2)

## 2019-05-28 LAB — GLUCOSE, CAPILLARY
Glucose-Capillary: 81 mg/dL (ref 70–99)
Glucose-Capillary: 65 mg/dL — ABNORMAL LOW (ref 70–99)
Glucose-Capillary: 94 mg/dL (ref 70–99)

## 2019-05-28 LAB — SARS CORONAVIRUS 2 (TAT 6-24 HRS): SARS Coronavirus 2: NEGATIVE

## 2019-05-28 LAB — CBG MONITORING, ED
Glucose-Capillary: 72 mg/dL (ref 70–99)
Glucose-Capillary: 68 mg/dL — ABNORMAL LOW (ref 70–99)

## 2019-05-28 LAB — LIPASE, BLOOD: Lipase: 949 U/L — ABNORMAL HIGH (ref 11–51)

## 2019-05-28 MED ORDER — STERILE WATER FOR INJECTION IJ SOLN
INTRAMUSCULAR | Status: AC
  Filled 2019-05-28: qty 10

## 2019-05-28 MED ORDER — HYDRALAZINE HCL 20 MG/ML IJ SOLN
INTRAMUSCULAR | Status: AC
  Filled 2019-05-28: qty 1

## 2019-05-28 MED ORDER — ONDANSETRON HCL 4 MG PO TABS: 4.0000 mg | ORAL_TABLET | Freq: Four times a day (QID) | ORAL | Status: DC | PRN

## 2019-05-28 MED ORDER — SODIUM CHLORIDE 0.9 % IV SOLN: 100.0000 mL | INTRAVENOUS | Status: DC | PRN

## 2019-05-28 MED ORDER — HYDROMORPHONE HCL 1 MG/ML IJ SOLN
1.0000 mg | Freq: Once | INTRAMUSCULAR | Status: AC
  Administered 2019-05-28: 04:00:00 1 mg via INTRAMUSCULAR
  Filled 2019-05-28: qty 1

## 2019-05-28 MED ORDER — SODIUM ZIRCONIUM CYCLOSILICATE 5 G PO PACK
5.0000 g | PACK | Freq: Once | ORAL | Status: DC
  Filled 2019-05-28: qty 1

## 2019-05-28 MED ORDER — LINACLOTIDE 72 MCG PO CAPS
144.0000 ug | ORAL_CAPSULE | Freq: Every day | ORAL | Status: DC
  Administered 2019-05-28 – 2019-05-30 (×3): 144 ug via ORAL
  Filled 2019-05-28 (×3): qty 2

## 2019-05-28 MED ORDER — HEPARIN SODIUM (PORCINE) 1000 UNIT/ML DIALYSIS
1000.0000 [IU] | INTRAMUSCULAR | Status: DC | PRN
  Filled 2019-05-28: qty 1

## 2019-05-28 MED ORDER — RENA-VITE PO TABS
1.0000 | ORAL_TABLET | Freq: Every day | ORAL | Status: DC
  Administered 2019-05-28 – 2019-05-29 (×2): 1 via ORAL
  Filled 2019-05-28 (×2): qty 1

## 2019-05-28 MED ORDER — DIPHENHYDRAMINE HCL 25 MG PO CAPS: 25.0000 mg | ORAL_CAPSULE | Freq: Three times a day (TID) | ORAL | Status: DC | PRN

## 2019-05-28 MED ORDER — MORPHINE SULFATE (PF) 2 MG/ML IV SOLN
2.0000 mg | INTRAVENOUS | Status: DC | PRN
  Administered 2019-05-28 – 2019-05-29 (×5): 2 mg via INTRAVENOUS
  Filled 2019-05-28 (×5): qty 1

## 2019-05-28 MED ORDER — CYCLOBENZAPRINE HCL 10 MG PO TABS
10.0000 mg | ORAL_TABLET | Freq: Three times a day (TID) | ORAL | Status: DC
  Administered 2019-05-28 – 2019-05-30 (×6): 10 mg via ORAL
  Filled 2019-05-28 (×6): qty 1

## 2019-05-28 MED ORDER — APIXABAN 5 MG PO TABS
5.0000 mg | ORAL_TABLET | Freq: Two times a day (BID) | ORAL | Status: DC
  Administered 2019-05-28 – 2019-05-30 (×5): 5 mg via ORAL
  Filled 2019-05-28 (×5): qty 1

## 2019-05-28 MED ORDER — DEXTROSE 50 % IV SOLN
1.0000 | INTRAVENOUS | Status: DC | PRN
  Administered 2019-05-29: 50 mL via INTRAVENOUS
  Filled 2019-05-28: qty 50

## 2019-05-28 MED ORDER — CHLORHEXIDINE GLUCONATE CLOTH 2 % EX PADS: 6.0000 | MEDICATED_PAD | Freq: Every day | CUTANEOUS | Status: DC

## 2019-05-28 MED ORDER — AMLODIPINE BESYLATE 5 MG PO TABS
5.0000 mg | ORAL_TABLET | Freq: Every day | ORAL | Status: DC
  Administered 2019-05-28 – 2019-05-29 (×2): 5 mg via ORAL
  Filled 2019-05-28 (×2): qty 1

## 2019-05-28 MED ORDER — METOCLOPRAMIDE HCL 5 MG/ML IJ SOLN
10.0000 mg | Freq: Once | INTRAMUSCULAR | Status: AC
  Administered 2019-05-28: 04:00:00 10 mg via INTRAMUSCULAR
  Filled 2019-05-28: qty 2

## 2019-05-28 MED ORDER — ONDANSETRON HCL 4 MG/2ML IJ SOLN
4.0000 mg | Freq: Four times a day (QID) | INTRAMUSCULAR | Status: DC | PRN
  Administered 2019-05-30: 4 mg via INTRAVENOUS
  Filled 2019-05-28: qty 2

## 2019-05-28 MED ORDER — HYDRALAZINE HCL 20 MG/ML IJ SOLN
10.0000 mg | INTRAMUSCULAR | Status: DC | PRN
  Administered 2019-05-28: 10 mg via INTRAVENOUS

## 2019-05-28 MED ORDER — HYDROMORPHONE HCL 1 MG/ML IJ SOLN
1.0000 mg | Freq: Once | INTRAMUSCULAR | Status: AC
  Administered 2019-05-28: 01:00:00 1 mg via INTRAMUSCULAR
  Filled 2019-05-28: qty 1

## 2019-05-28 MED ORDER — PROMETHAZINE HCL 25 MG/ML IJ SOLN
25.0000 mg | Freq: Once | INTRAMUSCULAR | Status: AC
  Administered 2019-05-28: 01:00:00 25 mg via INTRAMUSCULAR
  Filled 2019-05-28: qty 1

## 2019-05-28 MED ORDER — PENTAFLUOROPROP-TETRAFLUOROETH EX AERO: 1.0000 "application " | INHALATION_SPRAY | CUTANEOUS | Status: DC | PRN

## 2019-05-28 MED ORDER — ALTEPLASE 2 MG IJ SOLR
2.0000 mg | Freq: Once | INTRAMUSCULAR | Status: DC | PRN
  Filled 2019-05-28: qty 2

## 2019-05-28 MED ORDER — HYDRALAZINE HCL 50 MG PO TABS
100.0000 mg | ORAL_TABLET | Freq: Three times a day (TID) | ORAL | Status: DC
  Administered 2019-05-28 – 2019-05-29 (×4): 100 mg via ORAL
  Filled 2019-05-28 (×4): qty 2

## 2019-05-28 MED ORDER — LIDOCAINE-PRILOCAINE 2.5-2.5 % EX CREA
1.0000 "application " | TOPICAL_CREAM | CUTANEOUS | Status: DC | PRN
  Filled 2019-05-28: qty 5

## 2019-05-28 MED ORDER — LIDOCAINE HCL (PF) 1 % IJ SOLN
5.0000 mL | INTRAMUSCULAR | Status: DC | PRN
  Filled 2019-05-28: qty 5

## 2019-05-28 MED ORDER — PANTOPRAZOLE SODIUM 40 MG IV SOLR
40.0000 mg | Freq: Once | INTRAVENOUS | Status: DC
  Filled 2019-05-28: qty 40

## 2019-05-28 MED ORDER — PROMETHAZINE HCL 25 MG RE SUPP
25.0000 mg | Freq: Four times a day (QID) | RECTAL | Status: DC | PRN
  Filled 2019-05-28: qty 1

## 2019-05-28 NOTE — ED Notes (Signed)
Spoke with IV team who stated that there was nothing further they could attempt on him. Will inform attending MD.

## 2019-05-28 NOTE — ED Notes (Signed)
Pt unable to tolerate Lokelma medication in water. Will continue to monitor.

## 2019-05-28 NOTE — Progress Notes (Signed)
Spoke with ED nurse regarding multiple IV start attempts. Suggest EJ placement if patient continues to refuse the use of his upper arm.
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
## 2019-05-28 NOTE — ED Notes (Signed)
Able to draw blood work with ultrasound, but unable to thread IV catheter. Provider aware.

## 2019-05-28 NOTE — ED Notes (Signed)
Pt given apple juice.

## 2019-05-28 NOTE — TOC Initial Note (Signed)
Transition of Care [facility]) - Initial/Assessment Note    Patient Details  Name: Frank [name] MRN: [id number] Date of Birth: 04/11/1966  [facility] CM/SW Contact:    Benard Halsted, LCSW Phone Number: 05/28/2019, 4:39 PM  Clinical Narrative:                 Patient is a 53 year old male with history of chronic pancreatitis, hypertension, ESRD on dialysis, diabetes mellitus type 2, PE who presented with abdominal pain with nausea and vomiting. TOC team following for high hospital readmission risk and discharge needs.     Barriers to Discharge: Continued Medical Work up   Patient Goals and CMS Choice        Expected Discharge Plan and Services         Living arrangements for the past 2 months: Single Family Home                                      Prior Living Arrangements/Services Living arrangements for the past 2 months: Single Family Home   Patient language and need for interpreter reviewed:: Yes        Need for Family Participation in Patient Care: No (Comment) Care giver support system in place?: Yes (comment)   Criminal Activity/Legal Involvement Pertinent to Current Situation/Hospitalization: No - Comment as needed  Activities of Daily Living      Permission Sought/Granted                  Emotional Assessment Appearance:: Appears stated age     Orientation: : Oriented to Self, Oriented to Situation, Oriented to Place, Oriented to  Time Alcohol / Substance Use: Not Applicable Psych Involvement: No (comment)  Admission diagnosis:  Hyperkalemia [E87.5] ESRD needing dialysis ([place]) [N18.6, Z99.2] Abdominal pain [R10.9] Acute on chronic pancreatitis ([place]) [K85.90, K86.1] Acute pancreatitis [K85.90] Patient Active Problem List   Diagnosis Date Noted  . Acute pancreatitis 05/28/2019  . Hypertensive urgency 05/28/2019  . Uremia 05/17/2019  . Pancreatitis, acute 05/09/2019  . Intractable nausea and vomiting 04/19/2019  .  Abdominal pain 04/12/2019  . Volume overload 03/11/2019  . Pneumothorax, right   . Malnutrition of moderate degree 07/29/2018  . Chest tube in place   . Chronic, continuous use of opioids 07/28/2018  . Chest pain   . Chronic vomiting 07/26/2018  . History of Clostridioides difficile infection 07/26/2018  . Empyema of right pleural space ([place]) 07/26/2018  . Chronic pancreatitis ([place]) 05/09/2018  . Foot pain, right 04/25/2018  . Dialysis patient, noncompliant ([place]) 03/05/2018  . DNR (do not resuscitate) discussion   . Hydropneumothorax 01/31/2018  . Hyperkalemia 01/25/2018  . PE (pulmonary thromboembolism) ([place]) 01/16/2018  . Benign hypertensive heart and kidney disease with systolic CHF, NYHA class 3 and CKD stage 5 ([place])   . End-stage renal disease on hemodialysis ([place])   . Cirrhosis ([place])   . Pancreatic pseudocyst   . Acute on chronic pancreatitis ([place]) 08/09/2017  . ESRD needing dialysis ([place]) 05/26/2017  . Marijuana abuse 04/21/2017  . History of DVT (deep vein thrombosis) 03/11/2017  . Aortic atherosclerosis ([place]) 01/05/2017  . GERD (gastroesophageal reflux disease) 05/29/2016  . Nonischemic cardiomyopathy ([place]) 01/09/2016  . Chronic pain   . Recurrent abdominal pain   . Left renal mass 10/30/2015  . Chronic systolic heart failure ([place]) 09/23/2015  . Recurrent chest pain 09/08/2015  . Essential hypertension 01/02/2015  .  Dyslipidemia   . Pulmonary hypertension ([place])   . DM (diabetes mellitus), type 2, uncontrolled, with renal complications ([place])   . History of pulmonary embolism 05/08/2014  . Complex sleep apnea syndrome 05/05/2014  . Anemia of chronic kidney failure 06/24/2013  . Nausea vomiting and diarrhea 06/24/2013   PCP:  Patient, [name] Pharmacy:   [facility] [place] [place] - [address] [place] [address] Phone: [phone] Fax: [phone]  [facility] - [place], [place] - [address], [address] [place] [address] Phone: [phone] Fax: [phone]  [id number] [address] [address] [place]Phone: [phone]: [phone]    Social Determinants of Health (SDOH) Interventions    Readmission Risk Interventions No flowsheet data found.

## 2019-05-28 NOTE — Progress Notes (Signed)
Pt arrived to 5W22 from [facility]. VS: 97.54F 166/96 HR66 RR19 SpO2100% 2L. Pt oriented to room. Pt educated on CHG bath but refused. Pt refuses BP on arm allowing only to be taken on ankle. Tele connected. Bed locked, low. Call bell within reach.

## 2019-05-28 NOTE — ED Notes (Signed)
[facility] called for transport to [facility].
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
## 2019-05-28 NOTE — Discharge Instructions (Signed)
Information on my medicine - ELIQUIS (apixaban)  This medication education was reviewed with me or my healthcare representative as part of my discharge preparation.    Why was Eliquis prescribed for you? Eliquis was prescribed for you to reduce the risk of forming blood clots that can cause a stroke if you have a medical condition called atrial fibrillation (a type of irregular heartbeat) OR to reduce the risk of a blood clots forming after orthopedic surgery.  What do You need to know about Eliquis ? Take your Eliquis TWICE DAILY - one tablet in the morning and one tablet in the evening with or without food.  It would be best to take the doses about the same time each day.  If you have difficulty swallowing the tablet whole please discuss with your pharmacist how to take the medication safely.  Take Eliquis exactly as prescribed by your doctor and DO NOT stop taking Eliquis without talking to the doctor who prescribed the medication.  Stopping may increase your risk of developing a new clot or stroke.  Refill your prescription before you run out.  After discharge, you should have regular check-up appointments with your healthcare provider that is prescribing your Eliquis.  In the future your dose may need to be changed if your kidney function or weight changes by a significant amount or as you get older.  What do you do if you miss a dose? If you miss a dose, take it as soon as you remember on the same day and resume taking twice daily.  Do not take more than one dose of ELIQUIS at the same time.  Important Safety Information A possible side effect of Eliquis is bleeding. You should call your healthcare provider right away if you experience any of the following: ? Bleeding from an injury or your nose that does not stop. ? Unusual colored urine (red or dark brown) or unusual colored stools (red or black). ? Unusual bruising for unknown reasons. ? A serious fall or if you hit your  head (even if there is no bleeding).  Some medicines may interact with Eliquis and might increase your risk of bleeding or clotting while on Eliquis. To help avoid this, consult your healthcare provider or pharmacist prior to using any new prescription or non-prescription medications, including herbals, vitamins, non-steroidal anti-inflammatory drugs (NSAIDs) and supplements.  This website has more information on Eliquis (apixaban): [email or web address].
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
## 2019-05-28 NOTE — ED Notes (Addendum)
Dr. Alfredia Ferguson, attending MD, informed about presence of EKG in epic records, about hyperkalemia, and that IV access is unable to be obtained by either IV team or ED staff.

## 2019-05-28 NOTE — Progress Notes (Signed)
HD tx terminated with 72mns remaining as ordered. Pt refused to sign an early termination against medical advice.Pt alert/oriented in no acute distress. No complaints voiced. VSS. Reports given to primary nurse.

## 2019-05-28 NOTE — ED Notes (Signed)
Iv team unable to obtain access. Provider made aware.

## 2019-05-28 NOTE — Progress Notes (Addendum)
Patient is a 53 year old male with history of chronic pancreatitis, hypertension, ESRD on dialysis, diabetes mellitus type 2, PE who presented with abdominal pain with nausea and vomiting.  He missed his dialysis on Thursday.  On presentation he was found to be hyperkalemic.  He has history of chronic pancreatitis and has frequent admissions in the past.  Patient was admitted for the management of acute on chronic pancreatitis.  Patient seen and examined the bedside this afternoon.  Currently he is hemodynamically stable.  He continues to complain of abdominal pain.  I have also discussed with nephrology about the need of dialysis today. Examination at the bedside revealed tender abdomen mainly on the epigastric region. Patient seen by Dr. Hal Hope this morning.  I agree with the assessment and plan of Dr. Hal Hope.

## 2019-05-28 NOTE — ED Notes (Signed)
Pt seen by attending as he was leaving with [facility]. [facility] transporting pt to [facility] at this time.

## 2019-05-28 NOTE — Progress Notes (Signed)
Started in the emergency room prior to midnight yesterday and patient was admitted early this morning after midnight and I am in current agreement with assessment and plan done by Dr. Gean Birchwood.  Additional changes to the plan of care been made accordingly.  Essentially the patient is a 53 year old African-American male with a past medical history significant for but not limited to chronic pancreatitis, hypertension, ESRD on hemodialysis on Tuesday Thursday Saturday, diabetes mellitus type 2, history of PE and other comorbidities who presented to the ED for increasing abdominal pain with nausea and vomiting for last 2 days.  Patient has missed his dialysis but denies any shortness of breath.  He was worked up in the ED and found to have an elevated lipase along with a elevated potassium level and severely elevated creatinine level.  Blood pressure was severely elevated with the systolic being over 944.  Patient was attempted to be given [facility] for his hyperkalemia however was unable to keep anything down given his nausea and vomiting and he will be currently transferred to [facility] for dialysis.  Unfortunately IV access was not obtained prior to transfer to [facility] and will need to be obtained there.  Likely patient will need urgent dialysis.  Patient will be seen by my partner Dr. Shelly Coss.

## 2019-05-29 LAB — GLUCOSE, CAPILLARY
Glucose-Capillary: 126 mg/dL — ABNORMAL HIGH (ref 70–99)
Glucose-Capillary: 145 mg/dL — ABNORMAL HIGH (ref 70–99)
Glucose-Capillary: 61 mg/dL — ABNORMAL LOW (ref 70–99)
Glucose-Capillary: 72 mg/dL (ref 70–99)
Glucose-Capillary: 73 mg/dL (ref 70–99)
Glucose-Capillary: 74 mg/dL (ref 70–99)

## 2019-05-29 MED ORDER — SODIUM THIOSULFATE 25 % IV SOLN
25.0000 g | Freq: Once | INTRAVENOUS | Status: AC
  Administered 2019-05-30: 25 g via INTRAVENOUS
  Filled 2019-05-29 (×2): qty 100

## 2019-05-29 MED ORDER — HYDROMORPHONE HCL 1 MG/ML IJ SOLN
0.5000 mg | INTRAMUSCULAR | Status: DC | PRN
  Administered 2019-05-29 – 2019-05-30 (×9): 0.5 mg via INTRAVENOUS
  Filled 2019-05-29 (×7): qty 0.5

## 2019-05-29 NOTE — Progress Notes (Addendum)
  [facility] Progress Note   Subjective:  Seen in room. Continued abd pain/nausea. Signed off dialysis early last night d/t pain. For HD again today. Denies CP, SOB.   Objective Vitals:   05/28/19 2100 05/28/19 2130 05/28/19 2144 05/29/19 0435  BP: (!) 211/83 (!) 208/80 (!) 200/95 (!) 156/76  Pulse: 80 81 79 89  Resp: _0 Temp:   (!) 97.5 F (36.4 C) 98.1 F (36.7 C)  TempSrc:   Oral Oral  SpO2:   98% 100%  Weight:      Height:        Physical Exam General: WNWD NAD  Heart: RRR Lungs: CTAB  Abdomen: soft, diffuse tenderness Extremities: No sig LE edema  Dialysis Access: L Forearm AVF +AVF    Weight change:    Additional Objective Labs: Basic Metabolic Panel: Recent Labs  Lab 05/22/19 1630 05/28/19 0218  NA 138 138  K 4.0 6.1*  CL 94* 98  CO2 26 17*  GLUCOSE 78 75  BUN 27* 97*  CREATININE 6.21* 16.90*  CALCIUM 8.8* 7.9*   CBC: Recent Labs  Lab 05/22/19 1630 05/28/19 0218  WBC 4.6 4.0  HGB 12.5* 10.8*  HCT 39.0 35.8*  MCV 88.6 91.1  PLT 141* 189   Blood Culture    Component Value Date/Time   SDES PLEURAL 07/29/2018 1300   SDES PLEURAL 07/29/2018 1300   SPECREQUEST NONE 07/29/2018 1300   SPECREQUEST NONE 07/29/2018 1300   CULT  07/29/2018 1300    NO GROWTH 5 DAYS Performed at [facility], [place] [address], [place], [place] [address]    REPTSTATUS 08/03/2018 FINAL 07/29/2018 1300   REPTSTATUS 07/29/2018 FINAL 07/29/2018 1300     Medications:  . amLODipine  5 mg Oral Daily  . apixaban  5 mg Oral BID  . Chlorhexidine Gluconate Cloth  6 each Topical Q0600  . cyclobenzaprine  10 mg Oral TID  . hydrALAZINE  100 mg Oral TID  . linaclotide  144 mcg Oral QAC breakfast  . multivitamin  1 tablet Oral QHS  . pantoprazole (PROTONIX) IV  40 mg Intravenous Once  . sodium chloride flush  3 mL Intravenous Once  . sodium zirconium cyclosilicate  5 g Oral Once    Dialysis Orders:  [facility] TTS. 4h 400/800 EDW 72kg 2K/2.25Ca  L AVF No heparin -Parsabiv 34m IV TIW -Mircera 200 q 2 weeks (last 10/13) -Na thio 25g TIW   Assessment/Plan: 1. Acute on chronic pancreatitis. Lipase 949 on admission. Per primary  2. ESRD -  HD TTS.  Missed about 1 week of HD prior to admit. HD 11/6. Short HD on schedule today  3. Hyperkalemia - 2/2 missed HD. S/p HD Repeat labs today.  4. Hypertension/volume  - BP improved with dialysis/meds.Continue home meds. No gross volume overload on exam. UF 2L today, standing weights.  5. Anemia  - Hgb 10.8 No ESA indicated. Follow trends.  6. Metabolic bone disease -  Hx calciphylaxis. No Ca/VDRA. Gets Na thio as outpatient.  Parsabiv not available in hospital. Fosrenol binder when eating.  7. Nutrition - Renal diet when eating.    OLynnda ChildPA-C [facility] Pager [phone]/01/2019,11:28 AM  LOS: 1 day   Pt seen, examined and agree w A/P as above.  RKelly Splinter MD 05/29/2019, 1:02 PM

## 2019-05-29 NOTE — Progress Notes (Signed)
PROGRESS NOTE    Frank [name]  HYI:[id number] DOB: [birth year] DOA: 05/27/2019 PCP: Patient, [name]   Brief Narrative:  Patient is a 53 year old male with history of chronic pancreatitis, hypertension, ESRD on dialysis, diabetes mellitus type 2, PE ,CHF,NICM who presented with abdominal pain with nausea and vomiting.  He missed his dialysis on Thursday.    He has history of noncompliance with dialysis.  On presentation he was found to be hyperkalemic.  He has history of chronic pancreatitis and has frequent admissions in the past.He often leaves AMA.  Patient was admitted for the management of acute on chronic pancreatitis.Nephrology following for dialysis.  Assessment & Plan:   Active Problems:   DM (diabetes mellitus), type 2, uncontrolled, with renal complications (HCC)   Essential hypertension   ESRD needing dialysis ([place])   Cirrhosis ([place])   PE (pulmonary thromboembolism) ([place])   Hyperkalemia   Acute pancreatitis   Hypertensive urgency   Acute on chronic pancreatitis: Lipase was elevated at 949 on presentation.  Continue pain management.  Continues to complain of epigastric pain today.  No nausea or vomiting at this time.  Abdominal series did not show any acute intra-abdominal abnormalities.  Continue clear liquid diet for today. Will gradually advance diet.  He persistently asks for pain medicines.  I reviewed last CT abdomen done on October and September of this year which did not show any abnormality in the pancreas.  ESRD on dialysis: Underwent urgent dialysis as per nephrology.  Undergoing dialysis as per TTS schedule.  Hyperkalemia: Was hyperkalemic yesterday.  Underwent urgent dialysis.  Denied blood work this morning.  Will check BMP later today .  Normocytic anemia: Secondary to ESRD.  Continue to monitor.  Hypertension: Continue current medicines.  Continue to monitor blood pressure  History of PE: On Eliquis  History of diabetes mellitus: Not on medications at  home.Diet controlled.  Continue to monitor CBC.           DVT prophylaxis: Eliquis Code Status: Full Family Communication: None present at the bedside Disposition Plan: Home after abdominal pain resolves   Consultants: Nephrology  Procedures: Dialysis  Antimicrobials:  Anti-infectives (From admission, onward)   None      Subjective:  Patient seen and examined at bedside this morning.  Hemodynamically stable.  Demands Dilaudid and says  morphine does not work for him.  Complains of abdominal pain.  No nausea or vomiting.  Objective: Vitals:   05/28/19 2100 05/28/19 2130 05/28/19 2144 05/29/19 0435  BP: (!) 211/83 (!) 208/80 (!) 200/95 (!) 156/76  Pulse: 80 81 79 89  Resp: _0 Temp:   (!) 97.5 F (36.4 C) 98.1 F (36.7 C)  TempSrc:   Oral Oral  SpO2:   98% 100%  Weight:      Height:        Intake/Output Summary (Last 24 hours) at 05/29/2019 1111 Last data filed at 05/28/2019 2144 Gross per 24 hour  Intake 720 ml  Output 2200 ml  Net -1480 ml   Filed Weights   05/27/19 2316  Weight: 72.2 kg    Examination:  General exam: Not in distress,average built HEENT:PERRL,Oral mucosa moist, Ear/Nose normal on gross exam Respiratory system: Bilateral equal air entry, normal vesicular breath sounds, no wheezes or crackles  Cardiovascular system: S1 & S2 heard, RRR. No JVD, murmurs, rubs, gallops or clicks. No pedal edema. Gastrointestinal system: Abdomen is nondistended, soft and has tenderness in the epigastric region.. No organomegaly or  masses felt. Normal bowel sounds heard. Central nervous system: Alert and oriented. No focal neurological deficits. Extremities: No edema, no clubbing ,no cyanosis, distal peripheral pulses palpable.AV graft on the RUE Skin: No rashes, lesions or ulcers,no icterus ,no pallor      Data Reviewed: I have personally reviewed following labs and imaging studies  CBC: Recent Labs  Lab 05/22/19 1630 05/28/19 0218  WBC  4.6 4.0  HGB 12.5* 10.8*  HCT 39.0 35.8*  MCV 88.6 91.1  PLT 141* 762   Basic Metabolic Panel: Recent Labs  Lab 05/22/19 1630 05/28/19 0218  NA 138 138  K 4.0 6.1*  CL 94* 98  CO2 26 17*  GLUCOSE 78 75  BUN 27* 97*  CREATININE 6.21* 16.90*  CALCIUM 8.8* 7.9*   GFR: Estimated Creatinine Clearance: 4.9 mL/min (A) (by C-G formula based on SCr of 16.9 mg/dL (H)). Liver Function Tests: Recent Labs  Lab 05/22/19 1630 05/28/19 0218  AST 19 20  ALT 12 12  ALKPHOS 204* 227*  BILITOT 0.6 1.0  PROT 8.9* 8.4*  ALBUMIN 3.8 3.9   Recent Labs  Lab 05/22/19 1630 05/28/19 0218  LIPASE 47 949*   No results for input(s): AMMONIA in the last 168 hours. Coagulation Profile: No results for input(s): INR, PROTIME in the last 168 hours. Cardiac Enzymes: No results for input(s): CKTOTAL, CKMB, CKMBINDEX, TROPONINI in the last 168 hours. BNP (last 3 results) No results for input(s): PROBNP in the last 8760 hours. HbA1C: No results for input(s): HGBA1C in the last 72 hours. CBG: Recent Labs  Lab 05/28/19 1230 05/28/19 1844 05/29/19 0433 05/29/19 0516 05/29/19 0808  GLUCAP 65* 94 61* 145* 74   Lipid Profile: No results for input(s): CHOL, HDL, LDLCALC, TRIG, CHOLHDL, LDLDIRECT in the last 72 hours. Thyroid Function Tests: No results for input(s): TSH, T4TOTAL, FREET4, T3FREE, THYROIDAB in the last 72 hours. Anemia Panel: No results for input(s): VITAMINB12, FOLATE, FERRITIN, TIBC, IRON, RETICCTPCT in the last 72 hours. Sepsis Labs: No results for input(s): PROCALCITON, LATICACIDVEN in the last 168 hours.  Recent Results (from the past 240 hour(s))  SARS CORONAVIRUS 2 (TAT 6-24 HRS) Nasopharyngeal Nasopharyngeal Swab     Status: None   Collection Time: 05/28/19  4:58 AM   Specimen: Nasopharyngeal Swab  Result Value Ref Range Status   SARS Coronavirus 2 NEGATIVE NEGATIVE Final    Comment: (NOTE) SARS-CoV-2 target nucleic acids are NOT DETECTED. The SARS-CoV-2 RNA is  generally detectable in upper and lower respiratory specimens during the acute phase of infection. Negative results do not preclude SARS-CoV-2 infection, do not rule out co-infections with other pathogens, and should not be used as the sole basis for treatment or other patient management decisions. Negative results must be combined with clinical observations, patient history, and epidemiological information. The expected result is Negative. Fact Sheet for Patients: SugarRoll.be Fact Sheet for Healthcare Providers: https://www.woods-mathews.com/ This test is not yet approved or cleared by the [place] FDA and  has been authorized for detection and/or diagnosis of SARS-CoV-2 by FDA under an Emergency Use Authorization (EUA). This EUA will remain  in effect (meaning this test can be used) for the duration of the COVID-19 declaration under Section 56 4(b)(1) of the Act, 21 U.S.C. section 360bbb-3(b)(1), unless the authorization is terminated or revoked sooner. Performed at [facility], [place] [address], [place], [place] [address]          Radiology Studies: Dg Abd Acute 2+v W 1v Chest  Result Date:  05/28/2019 CLINICAL DATA:  Pancreatitis EXAM: DG ABDOMEN ACUTE W/ 1V CHEST COMPARISON:  05/18/2019 FINDINGS: Cardiomegaly and vascular pedicle widening. Airspace opacity with pleural fluid at the right base. Normal bowel gas pattern. No evidence of pneumoperitoneum. No concerning mass effect but limited by overlapping artifact. Extensive arterial calcification IMPRESSION: 1. Normal bowel gas pattern.  No evidence of pneumoperitoneum. 2. Chronic pleuroparenchymal opacification at the right base. Electronically Signed   By: Monte Fantasia M.D.   On: 05/28/2019 06:02        Scheduled Meds: . amLODipine  5 mg Oral Daily  . apixaban  5 mg Oral BID  . Chlorhexidine Gluconate Cloth  6 each Topical Q0600  . cyclobenzaprine  10 mg Oral TID   . hydrALAZINE  100 mg Oral TID  . linaclotide  144 mcg Oral QAC breakfast  . multivitamin  1 tablet Oral QHS  . pantoprazole (PROTONIX) IV  40 mg Intravenous Once  . sodium chloride flush  3 mL Intravenous Once  . sodium zirconium cyclosilicate  5 g Oral Once   Continuous Infusions:   LOS: 1 day    Time spent:25 mins. More than 50% of that time was spent in counseling and/or coordination of care.      Shelly Coss, MD [facility] Pager [phone]  If 7PM-7AM, please contact night-coverage [email or web address] Password [email or web address] 05/29/2019, 11:11 AM

## 2019-05-29 NOTE — Progress Notes (Signed)
Pt refusing labs this morning Dr. Tawanna Solo notified

## 2019-05-29 NOTE — Progress Notes (Signed)
Pt blood sugar 61, will administer 1 amp of dextrose 50% solution per order due to NPO order.  Patient is non-compliant with NPO order. He is agitated and threatening to leave AMA.

## 2019-05-29 NOTE — Progress Notes (Signed)
CBG improved to 145

## 2019-05-29 NOTE — Progress Notes (Signed)
Alerted floor RN Shinita patient will be done on Sunday

## 2019-05-30 LAB — RENAL FUNCTION PANEL
Albumin: 3.1 g/dL — ABNORMAL LOW (ref 3.5–5.0)
Anion gap: 18 — ABNORMAL HIGH (ref 5–15)
BUN: 77 mg/dL — ABNORMAL HIGH (ref 6–20)
CO2: 21 mmol/L — ABNORMAL LOW (ref 22–32)
Calcium: 8.2 mg/dL — ABNORMAL LOW (ref 8.9–10.3)
Chloride: 98 mmol/L (ref 98–111)
Creatinine, Ser: 15.24 mg/dL — ABNORMAL HIGH (ref 0.61–1.24)
GFR calc Af Amer: 4 mL/min — ABNORMAL LOW (ref >60)
GFR calc non Af Amer: 3 mL/min — ABNORMAL LOW (ref >60)
Glucose, Bld: 84 mg/dL (ref 70–99)
Phosphorus: 7.6 mg/dL — ABNORMAL HIGH (ref 2.5–4.6)
Potassium: 4.7 mmol/L (ref 3.5–5.1)
Sodium: 137 mmol/L (ref 135–145)

## 2019-05-30 LAB — GLUCOSE, CAPILLARY
Glucose-Capillary: 68 mg/dL — ABNORMAL LOW (ref 70–99)
Glucose-Capillary: 127 mg/dL — ABNORMAL HIGH (ref 70–99)
Glucose-Capillary: 91 mg/dL (ref 70–99)
Glucose-Capillary: 80 mg/dL (ref 70–99)

## 2019-05-30 LAB — CBC
HCT: 30.7 % — ABNORMAL LOW (ref 39.0–52.0)
Hemoglobin: 9.7 g/dL — ABNORMAL LOW (ref 13.0–17.0)
MCH: 28 pg (ref 26.0–34.0)
MCHC: 31.6 g/dL (ref 30.0–36.0)
MCV: 88.5 fL (ref 80.0–100.0)
Platelets: 154 10*3/uL (ref 150–400)
RBC: 3.47 MIL/uL — ABNORMAL LOW (ref 4.22–5.81)
RDW: 16.4 % — ABNORMAL HIGH (ref 11.5–15.5)
WBC: 3.7 10*3/uL — ABNORMAL LOW (ref 4.0–10.5)
nRBC: 0 % (ref 0.0–0.2)

## 2019-05-30 MED ORDER — HYDROMORPHONE HCL 1 MG/ML IJ SOLN
INTRAMUSCULAR | Status: AC
  Administered 2019-05-30: 0.5 mg via INTRAVENOUS
  Filled 2019-05-30: qty 0.5

## 2019-05-30 MED ORDER — SODIUM CHLORIDE 0.9 % IV SOLN: 100.0000 mL | INTRAVENOUS | Status: DC | PRN

## 2019-05-30 MED ORDER — HEPARIN SODIUM (PORCINE) 1000 UNIT/ML DIALYSIS: 1000.0000 [IU] | INTRAMUSCULAR | Status: DC | PRN

## 2019-05-30 MED ORDER — ALTEPLASE 2 MG IJ SOLR: 2.0000 mg | Freq: Once | INTRAMUSCULAR | Status: DC | PRN

## 2019-05-30 MED ORDER — LIDOCAINE-PRILOCAINE 2.5-2.5 % EX CREA: 1.0000 "application " | TOPICAL_CREAM | CUTANEOUS | Status: DC | PRN

## 2019-05-30 MED ORDER — LIDOCAINE HCL (PF) 1 % IJ SOLN: 5.0000 mL | INTRAMUSCULAR | Status: DC | PRN

## 2019-05-30 MED ORDER — CYCLOBENZAPRINE HCL 10 MG PO TABS: 10.0000 mg | ORAL_TABLET | Freq: Three times a day (TID) | ORAL | 0 refills | Status: DC | PRN

## 2019-05-30 MED ORDER — PENTAFLUOROPROP-TETRAFLUOROETH EX AERO: 1.0000 "application " | INHALATION_SPRAY | CUTANEOUS | Status: DC | PRN

## 2019-05-30 NOTE — Progress Notes (Signed)
[name] to be D/C'd Home per MD order.  Discussed with the patient and all questions fully answered.  VSS, Skin clean, dry and intact without evidence of skin break down, no evidence of skin tears noted. IV catheter discontinued intact. Site without signs and symptoms of complications. Dressing and pressure applied.  An After Visit Summary was printed and given to the patient. Patient received prescription.  D/c education completed with patient/family including follow up instructions, medication list, d/c activities limitations if indicated, with other d/c instructions as indicated by MD - patient able to verbalize understanding, all questions fully answered.   Patient instructed to return to ED, call 911, or call MD for any changes in condition.   Patient escorted via [place], and D/C home via private auto.  Luci Bank 05/30/2019 3:16 PM
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
## 2019-05-30 NOTE — Discharge Summary (Signed)
Physician Discharge Summary  Frank [name] QPY:[id number] DOB: [birth year] DOA: 05/27/2019  PCP: Patient, [name]  Admit date: 05/27/2019 Discharge date: 05/30/2019  Admitted From: Home Disposition:  Home  Discharge Condition:Stable CODE STATUS:FULL, Diet recommendation: Heart Healthy  Brief/Interim Summary:  Patient is a 53 year old male with history of chronic pancreatitis, hypertension, ESRD on dialysis, diabetes mellitus type 2, PE ,CHF,NICM who presented with abdominal pain with nausea and vomiting. He missed his dialysis on Thursday.   He has history of noncompliance with dialysis.  On presentation he was found to be hyperkalemic. He has history of chronic pancreatitis and has frequent admissions in the past.He often leaves AMA. Patient was admitted for the management of acute on chronic pancreatitis.Nephrology following for dialysis.  His abdominal pain is significantly better today.  He is tolerating soft diet.  His hemodynamically  stable for discharge to home today.  Following problems were addressed during his hospitalization:  Acute on chronic pancreatitis: Lipase was elevated at 949 on presentation.  Abdominal pain has significantly improved.No nausea or vomiting at this time.  Abdominal series did not show any acute intra-abdominal abnormalities. Tolerating soft diet.  I reviewed last CT abdomen done on October and September of this year which did not show any abnormality in the pancreas.  ESRD on dialysis: Underwent urgent dialysis as per nephrology.   Hyperkalemia: Resolved.  Normocytic anemia: Secondary to ESRD.  Continue to monitor.  Hypertension: Continue current medicines.    History of PE: On Eliquis  History of diabetes mellitus: Not on medications at home.Diet controlled.   Discharge Diagnoses:  Active Problems:   DM (diabetes mellitus), type 2, uncontrolled, with renal complications (HCC)   Essential hypertension   ESRD needing dialysis ([place])    Cirrhosis ([place])   PE (pulmonary thromboembolism) ([place])   Hyperkalemia   Acute pancreatitis   Hypertensive urgency    Discharge Instructions  Discharge Instructions    Diet - low sodium heart healthy   Complete by: As directed    Discharge instructions   Complete by: As directed    1)Follow up with your PCP in a week.   Increase activity slowly   Complete by: As directed      Allergies as of 05/30/2019      Reactions   Butalbital-apap-caffeine Shortness Of Breath, Swelling, Other (See Comments)   Swelling in throat   Ferrlecit [na Ferric Gluc Cplx In Sucrose] Shortness Of Breath, Swelling, Other (See Comments)   Swelling in throat, tolerates Venofor   Minoxidil Shortness Of Breath   Tylenol [acetaminophen] Anaphylaxis, Swelling   Darvocet [propoxyphene N-acetaminophen] Hives      Medication List    STOP taking these medications   ondansetron 4 MG disintegrating tablet Commonly known as: Zofran ODT     TAKE these medications   amLODipine 5 MG tablet Commonly known as: NORVASC Take 1 tablet (5 mg total) by mouth daily.   apixaban 5 MG Tabs tablet Commonly known as: Eliquis Take 1 tablet (5 mg total) by mouth 2 (two) times daily.   cyclobenzaprine 10 MG tablet Commonly known as: FLEXERIL Take 1 tablet (10 mg total) by mouth 3 (three) times daily as needed for muscle spasms. What changed:   when to take this  reasons to take this   diphenhydrAMINE 25 mg capsule Commonly known as: BENADRYL Take 25 mg by mouth every 8 (eight) hours as needed for itching.   hydrALAZINE 100 MG tablet Commonly known as: APRESOLINE Take 1 tablet (100 mg total)  by mouth 3 (three) times daily.   Linzess 72 MCG capsule Generic drug: linaclotide Take 144 mcg by mouth daily before breakfast. Rx  Sent for Prior Authorization request to Dr. Ricard Dillon, would only pay for 1 daily.   lisinopril 5 MG tablet Commonly known as: ZESTRIL Take 5 mg by mouth daily.   Nephro Vitamins 0.8 MG  Tabs Take 1 tablet by mouth daily.   nitroGLYCERIN 0.4 MG SL tablet Commonly known as: NITROSTAT Place 1 tablet (0.4 mg total) under the tongue every 5 (five) minutes as needed for chest pain.   omeprazole 20 MG capsule Commonly known as: PRILOSEC Take 20 mg by mouth daily.   ondansetron 4 MG tablet Commonly known as: ZOFRAN Take 1 tablet (4 mg total) by mouth every 8 (eight) hours as needed for up to 20 doses for nausea or vomiting.   Oxycodone HCl 10 MG Tabs Take 1 tablet (10 mg total) by mouth every 12 (twelve) hours as needed for up to 6 doses.   promethazine 25 MG suppository Commonly known as: PHENERGAN Place 1 suppository (25 mg total) rectally every 6 (six) hours as needed for nausea or vomiting.   senna-docusate 8.6-50 MG tablet Commonly known as: Senokot-S Take 2 tablets by mouth at bedtime.      Follow-up Information    Lorretta Harp, MD. Schedule an appointment as soon as possible for a visit in 1 week(s).   Specialties: Cardiology, Radiology Contact information: [address] [place] [address] [place] [address] [phone]          Allergies  Allergen Reactions  . Butalbital-Apap-Caffeine Shortness Of Breath, Swelling and Other (See Comments)    Swelling in throat  . Ferrlecit [Na Ferric Gluc Cplx In Sucrose] Shortness Of Breath, Swelling and Other (See Comments)    Swelling in throat, tolerates Venofor  . Minoxidil Shortness Of Breath  . Tylenol [Acetaminophen] Anaphylaxis and Swelling  . Darvocet [Propoxyphene N-Acetaminophen] Hives    Consultations:  *Nephrology   Procedures/Studies: Ct Abdomen Pelvis Wo Contrast  Result Date: 05/06/2019 CLINICAL DATA:  Increased abdominal distention. End-stage renal disease. Failed kidney transplant. Chronic pancreatitis. EXAM: CT ABDOMEN AND PELVIS WITHOUT CONTRAST TECHNIQUE: Multidetector CT imaging of the abdomen and pelvis was performed following the standard protocol without IV contrast.  COMPARISON:  04/20/2019 FINDINGS: Lower chest: No acute findings. Stable right basilar pleural-parenchymal scarring with associated rounded atelectasis. Stable cardiomegaly and small pericardial effusion. Hepatobiliary: No mass visualized on this unenhanced exam. Gallbladder is unremarkable. No evidence of biliary ductal dilatation. Pancreas: No mass or inflammatory process visualized on this unenhanced exam. Spleen:  Within normal limits in size. Adrenals/Urinary tract: Normal adrenal glands. Diffuse atrophy of native kidneys again seen, consistent with end-stage renal disease. 3 cm high attenuation left renal lesion is stable, consistent with hemorrhagic cyst. Failed renal transplant with calcification is again seen in the left iliac fossa. Stomach/Bowel: No evidence of obstruction, inflammatory process, or abnormal fluid collections. Vascular/Lymphatic: No pathologically enlarged lymph nodes identified. No evidence of abdominal aortic aneurysm. Aortic atherosclerosis. Reproductive:  No mass or other significant abnormality. Other:  None. Musculoskeletal:  No suspicious bone lesions identified. IMPRESSION: No acute findings within the abdomen or pelvis. Stable appearance of failed, partially calcified renal transplant in left iliac fossa. Severe atrophy of native kidneys, consistent with end-stage renal disease. No evidence of hydronephrosis. Aortic Atherosclerosis (ICD10-I70.0). Electronically Signed   By: Marlaine Hind M.D.   On: 05/06/2019 13:05   Dg Abd 1 View  Result Date: 05/18/2019  CLINICAL DATA:  Acute right-sided abdominal pain. EXAM: ABDOMEN - 1 VIEW COMPARISON:  May 06, 2019.  April 19, 2019. FINDINGS: The bowel gas pattern is normal. No radio-opaque calculi or other significant radiographic abnormality are seen. IMPRESSION: No evidence of bowel obstruction or ileus. Aortic Atherosclerosis (ICD10-I70.0). Electronically Signed   By: Marijo Conception M.D.   On: 05/18/2019 13:29   Dg Chest  Port 1 View  Result Date: 05/17/2019 CLINICAL DATA:  Short of breath.  Missed dialysis EXAM: PORTABLE CHEST 1 VIEW COMPARISON:  05/06/2019 FINDINGS: Cardiac enlargement. Progression of vascular congestion and mild edema. Mild to moderate right pleural effusion unchanged with right lower lobe atelectasis. Surgical clips overlying the right hilum unchanged. IMPRESSION: Progression of fluid overload with mild edema. Right pleural effusion approximately unchanged. Electronically Signed   By: Franchot Gallo M.D.   On: 05/17/2019 11:17   Dg Chest Portable 1 View  Result Date: 05/06/2019 CLINICAL DATA:  Abdominal pain and vomiting EXAM: PORTABLE CHEST 1 VIEW COMPARISON:  04/17/2019, CT 04/20/2019 FINDINGS: Enlarged cardiac silhouette. Loculated pleural fluid andatelectasis seen at the RIGHT lung base as described on CT 04/20/2019. Mild central venous pulmonary congestion. No focal consolidation. No pneumothorax. IMPRESSION: 1. No interval change. 2. Marked cardiomegaly. 3. RIGHT pleural effusion with basilar loculation and atelectasis. Electronically Signed   By: Suzy Bouchard M.D.   On: 05/06/2019 09:04   Dg Abd Acute 2+v W 1v Chest  Result Date: 05/28/2019 CLINICAL DATA:  Pancreatitis EXAM: DG ABDOMEN ACUTE W/ 1V CHEST COMPARISON:  05/18/2019 FINDINGS: Cardiomegaly and vascular pedicle widening. Airspace opacity with pleural fluid at the right base. Normal bowel gas pattern. No evidence of pneumoperitoneum. No concerning mass effect but limited by overlapping artifact. Extensive arterial calcification IMPRESSION: 1. Normal bowel gas pattern.  No evidence of pneumoperitoneum. 2. Chronic pleuroparenchymal opacification at the right base. Electronically Signed   By: Monte Fantasia M.D.   On: 05/28/2019 06:02       Subjective:  Patient seen and examined the bedside this morning.  Hemodynamically stable for discharge.  Discharge Exam: Vitals:   05/30/19 1200 05/30/19 1230  BP: (!) 178/85 (!)  159/79  Pulse: 76 77  Resp:    Temp:    SpO2:     Vitals:   05/30/19 1100 05/30/19 1130 05/30/19 1200 05/30/19 1230  BP: 134/87 (!) 178/84 (!) 178/85 (!) 159/79  Pulse: 84 87 76 77  Resp:      Temp:      TempSrc:      SpO2:      Weight:      Height:        General: Pt is alert, awake, not in acute distress Cardiovascular: RRR, S1/S2 +, no rubs, no gallops Respiratory: CTA bilaterally, no wheezing, no rhonchi Abdominal: Soft, NT, ND, bowel sounds + Extremities: no edema, no cyanosis    The results of significant diagnostics from this hospitalization (including imaging, microbiology, ancillary and laboratory) are listed below for reference.     Microbiology: Recent Results (from the past 240 hour(s))  SARS CORONAVIRUS 2 (TAT 6-24 HRS) Nasopharyngeal Nasopharyngeal Swab     Status: None   Collection Time: 05/28/19  4:58 AM   Specimen: Nasopharyngeal Swab  Result Value Ref Range Status   SARS Coronavirus 2 NEGATIVE NEGATIVE Final    Comment: (NOTE) SARS-CoV-2 target nucleic acids are NOT DETECTED. The SARS-CoV-2 RNA is generally detectable in upper and lower respiratory specimens during the acute phase of infection. Negative results do not  preclude SARS-CoV-2 infection, do not rule out co-infections with other pathogens, and should not be used as the sole basis for treatment or other patient management decisions. Negative results must be combined with clinical observations, patient history, and epidemiological information. The expected result is Negative. Fact Sheet for Patients: SugarRoll.be Fact Sheet for Healthcare Providers: https://www.woods-mathews.com/ This test is not yet approved or cleared by the [place] FDA and  has been authorized for detection and/or diagnosis of SARS-CoV-2 by FDA under an Emergency Use Authorization (EUA). This EUA will remain  in effect (meaning this test can be used) for the duration of  the COVID-19 declaration under Section 56 4(b)(1) of the Act, 21 U.S.C. section 360bbb-3(b)(1), unless the authorization is terminated or revoked sooner. Performed at [facility], [place] [address], [place], [place] [address]      Labs: BNP (last 3 results) No results for input(s): BNP in the last 8760 hours. Basic Metabolic Panel: Recent Labs  Lab 05/28/19 0218 05/30/19 1139  NA 138 137  K 6.1* 4.7  CL 98 98  CO2 17* 21*  GLUCOSE 75 84  BUN 97* 77*  CREATININE 16.90* 15.24*  CALCIUM 7.9* 8.2*  PHOS  --  7.6*   Liver Function Tests: Recent Labs  Lab 05/28/19 0218 05/30/19 1139  AST 20  --   ALT 12  --   ALKPHOS 227*  --   BILITOT 1.0  --   PROT 8.4*  --   ALBUMIN 3.9 3.1*   Recent Labs  Lab 05/28/19 0218  LIPASE 949*   No results for input(s): AMMONIA in the last 168 hours. CBC: Recent Labs  Lab 05/28/19 0218 05/30/19 1139  WBC 4.0 3.7*  HGB 10.8* 9.7*  HCT 35.8* 30.7*  MCV 91.1 88.5  PLT 189 154   Cardiac Enzymes: No results for input(s): CKTOTAL, CKMB, CKMBINDEX, TROPONINI in the last 168 hours. BNP: Invalid input(s): POCBNP CBG: Recent Labs  Lab 05/29/19 1757 05/29/19 2334 05/30/19 0518 05/30/19 0623 05/30/19 0828  GLUCAP 126* 72 68* 127* 91   D-Dimer No results for input(s): DDIMER in the last 72 hours. Hgb A1c No results for input(s): HGBA1C in the last 72 hours. Lipid Profile No results for input(s): CHOL, HDL, LDLCALC, TRIG, CHOLHDL, LDLDIRECT in the last 72 hours. Thyroid function studies No results for input(s): TSH, T4TOTAL, T3FREE, THYROIDAB in the last 72 hours.  Invalid input(s): FREET3 Anemia work up No results for input(s): VITAMINB12, FOLATE, FERRITIN, TIBC, IRON, RETICCTPCT in the last 72 hours. Urinalysis    Component Value Date/Time   COLORURINE YELLOW 10/18/2013 [place] 10/18/2013 0419   LABSPEC 1.008 10/18/2013 0419   PHURINE 8.5 (H) 10/18/2013 0419   GLUCOSEU 100 (A) 10/18/2013 0419    HGBUR TRACE (A) 10/18/2013 0419   BILIRUBINUR NEGATIVE 10/18/2013 0419   KETONESUR NEGATIVE 10/18/2013 0419   PROTEINUR 100 (A) 10/18/2013 0419   UROBILINOGEN 0.2 10/18/2013 0419   NITRITE NEGATIVE 10/18/2013 0419   LEUKOCYTESUR NEGATIVE 10/18/2013 0419   Sepsis Labs Invalid input(s): PROCALCITONIN,  WBC,  LACTICIDVEN Microbiology Recent Results (from the past 240 hour(s))  SARS CORONAVIRUS 2 (TAT 6-24 HRS) Nasopharyngeal Nasopharyngeal Swab     Status: None   Collection Time: 05/28/19  4:58 AM   Specimen: Nasopharyngeal Swab  Result Value Ref Range Status   SARS Coronavirus 2 NEGATIVE NEGATIVE Final    Comment: (NOTE) SARS-CoV-2 target nucleic acids are NOT DETECTED. The SARS-CoV-2 RNA is generally detectable in upper and lower  respiratory specimens during the acute phase of infection. Negative results do not preclude SARS-CoV-2 infection, do not rule out co-infections with other pathogens, and should not be used as the sole basis for treatment or other patient management decisions. Negative results must be combined with clinical observations, patient history, and epidemiological information. The expected result is Negative. Fact Sheet for Patients: SugarRoll.be Fact Sheet for Healthcare Providers: https://www.woods-mathews.com/ This test is not yet approved or cleared by the [place] FDA and  has been authorized for detection and/or diagnosis of SARS-CoV-2 by FDA under an Emergency Use Authorization (EUA). This EUA will remain  in effect (meaning this test can be used) for the duration of the COVID-19 declaration under Section 56 4(b)(1) of the Act, 21 U.S.C. section 360bbb-3(b)(1), unless the authorization is terminated or revoked sooner. Performed at [facility], [place] [address], [place], [place] [address]     Please note: You were cared for by a hospitalist during your hospital stay. Once you are discharged, your  primary care physician will handle any further medical issues. Please note that NO REFILLS for any discharge medications will be authorized once you are discharged, as it is imperative that you return to your primary care physician (or establish a relationship with a primary care physician if you do not have one) for your post hospital discharge needs so that they can reassess your need for medications and monitor your lab values.    Time coordinating discharge: 40 minutes  SIGNED:   Shelly Coss, MD  [facility] 05/30/2019, 1:04 PM Pager [phone]  If 7PM-7AM, please contact night-coverage [email or web address] Password [email or web address]

## 2019-05-31 ENCOUNTER — Encounter (HOSPITAL_COMMUNITY): Payer: Self-pay

## 2019-05-31 ENCOUNTER — Telehealth: Payer: Self-pay | Admitting: Nephrology

## 2019-05-31 ENCOUNTER — Other Ambulatory Visit: Payer: Self-pay

## 2019-05-31 DIAGNOSIS — Z20828 Contact with and (suspected) exposure to other viral communicable diseases: Secondary | ICD-10-CM | POA: Diagnosis not present

## 2019-05-31 DIAGNOSIS — K859 Acute pancreatitis without necrosis or infection, unspecified: Secondary | ICD-10-CM | POA: Diagnosis not present

## 2019-05-31 DIAGNOSIS — G8929 Other chronic pain: Secondary | ICD-10-CM | POA: Insufficient documentation

## 2019-05-31 DIAGNOSIS — R748 Abnormal levels of other serum enzymes: Secondary | ICD-10-CM | POA: Insufficient documentation

## 2019-05-31 DIAGNOSIS — J90 Pleural effusion, not elsewhere classified: Secondary | ICD-10-CM | POA: Diagnosis not present

## 2019-05-31 DIAGNOSIS — D631 Anemia in chronic kidney disease: Secondary | ICD-10-CM | POA: Diagnosis not present

## 2019-05-31 DIAGNOSIS — E875 Hyperkalemia: Secondary | ICD-10-CM | POA: Diagnosis not present

## 2019-05-31 DIAGNOSIS — R109 Unspecified abdominal pain: Secondary | ICD-10-CM | POA: Insufficient documentation

## 2019-05-31 DIAGNOSIS — I509 Heart failure, unspecified: Secondary | ICD-10-CM | POA: Insufficient documentation

## 2019-05-31 DIAGNOSIS — R1011 Right upper quadrant pain: Secondary | ICD-10-CM | POA: Diagnosis not present

## 2019-05-31 DIAGNOSIS — K860 Alcohol-induced chronic pancreatitis: Secondary | ICD-10-CM | POA: Diagnosis not present

## 2019-05-31 DIAGNOSIS — Z86718 Personal history of other venous thrombosis and embolism: Secondary | ICD-10-CM | POA: Insufficient documentation

## 2019-05-31 DIAGNOSIS — Z7901 Long term (current) use of anticoagulants: Secondary | ICD-10-CM | POA: Insufficient documentation

## 2019-05-31 DIAGNOSIS — Z79899 Other long term (current) drug therapy: Secondary | ICD-10-CM | POA: Insufficient documentation

## 2019-05-31 DIAGNOSIS — E1122 Type 2 diabetes mellitus with diabetic chronic kidney disease: Secondary | ICD-10-CM | POA: Insufficient documentation

## 2019-05-31 DIAGNOSIS — I132 Hypertensive heart and chronic kidney disease with heart failure and with stage 5 chronic kidney disease, or end stage renal disease: Secondary | ICD-10-CM | POA: Insufficient documentation

## 2019-05-31 DIAGNOSIS — Z87891 Personal history of nicotine dependence: Secondary | ICD-10-CM | POA: Insufficient documentation

## 2019-05-31 DIAGNOSIS — R112 Nausea with vomiting, unspecified: Secondary | ICD-10-CM | POA: Insufficient documentation

## 2019-05-31 DIAGNOSIS — N186 End stage renal disease: Secondary | ICD-10-CM | POA: Insufficient documentation

## 2019-05-31 DIAGNOSIS — I120 Hypertensive chronic kidney disease with stage 5 chronic kidney disease or end stage renal disease: Secondary | ICD-10-CM | POA: Diagnosis not present

## 2019-05-31 DIAGNOSIS — Z86711 Personal history of pulmonary embolism: Secondary | ICD-10-CM | POA: Insufficient documentation

## 2019-05-31 DIAGNOSIS — I5042 Chronic combined systolic (congestive) and diastolic (congestive) heart failure: Secondary | ICD-10-CM | POA: Diagnosis not present

## 2019-05-31 DIAGNOSIS — Z940 Kidney transplant status: Secondary | ICD-10-CM | POA: Diagnosis not present

## 2019-05-31 DIAGNOSIS — F1210 Cannabis abuse, uncomplicated: Secondary | ICD-10-CM | POA: Insufficient documentation

## 2019-05-31 LAB — GLUCOSE, CAPILLARY: Glucose-Capillary: 87 mg/dL (ref 70–99)

## 2019-05-31 NOTE — ED Triage Notes (Signed)
Pt arrived stating he was recently admitted for pancreatitis and discharged Friday. States that Saturday he began having right lower abdominal pain and vomiting.

## 2019-05-31 NOTE — Telephone Encounter (Signed)
Transition of Care Contact from [place]  Date of Discharge: 05/30/2019 Date of Contact: 05/31/2019 Method of contact: phone  Attempted to contact patient to discuss transition of care from inpatient admission. Patient did not answer the phone. I was unable to leave a message because to answering service picked up.  Jen Mow, PA-C [facility] Pager: [phone]

## 2019-06-01 ENCOUNTER — Emergency Department (HOSPITAL_COMMUNITY)
Admission: EM | Admit: 2019-06-01 | Discharge: 2019-06-01 | Disposition: A | Discharge status: Home / Self Care | Payer: Medicare Other | Source: Home / Self Care | Attending: Emergency Medicine | Admitting: Emergency Medicine

## 2019-06-01 DIAGNOSIS — R748 Abnormal levels of other serum enzymes: Secondary | ICD-10-CM

## 2019-06-01 DIAGNOSIS — N186 End stage renal disease: Secondary | ICD-10-CM

## 2019-06-01 DIAGNOSIS — D631 Anemia in chronic kidney disease: Secondary | ICD-10-CM

## 2019-06-01 DIAGNOSIS — G8929 Other chronic pain: Secondary | ICD-10-CM

## 2019-06-01 DIAGNOSIS — R112 Nausea with vomiting, unspecified: Secondary | ICD-10-CM

## 2019-06-01 LAB — CBC WITH DIFFERENTIAL/PLATELET
Abs Immature Granulocytes: 0.02 K/uL (ref 0.00–0.07)
Basophils Absolute: 0 K/uL (ref 0.0–0.1)
Basophils Relative: 1 %
Eosinophils Absolute: 0.2 K/uL (ref 0.0–0.5)
Eosinophils Relative: 5 %
HCT: 33.1 % — ABNORMAL LOW (ref 39.0–52.0)
Hemoglobin: 10.5 g/dL — ABNORMAL LOW (ref 13.0–17.0)
Immature Granulocytes: 1 %
Lymphocytes Relative: 20 %
Lymphs Abs: 0.7 K/uL (ref 0.7–4.0)
MCH: 28.4 pg (ref 26.0–34.0)
MCHC: 31.7 g/dL (ref 30.0–36.0)
MCV: 89.5 fL (ref 80.0–100.0)
Monocytes Absolute: 0.7 K/uL (ref 0.1–1.0)
Monocytes Relative: 19 %
Neutro Abs: 2 K/uL (ref 1.7–7.7)
Neutrophils Relative %: 54 %
Platelets: 172 K/uL (ref 150–400)
RBC: 3.7 MIL/uL — ABNORMAL LOW (ref 4.22–5.81)
RDW: 16.5 % — ABNORMAL HIGH (ref 11.5–15.5)
WBC: 3.6 K/uL — ABNORMAL LOW (ref 4.0–10.5)
nRBC: 0 % (ref 0.0–0.2)

## 2019-06-01 LAB — COMPREHENSIVE METABOLIC PANEL WITH GFR
ALT: 12 U/L (ref 0–44)
AST: 23 U/L (ref 15–41)
Albumin: 3.4 g/dL — ABNORMAL LOW (ref 3.5–5.0)
Alkaline Phosphatase: 224 U/L — ABNORMAL HIGH (ref 38–126)
Anion gap: 22 — ABNORMAL HIGH (ref 5–15)
BUN: 68 mg/dL — ABNORMAL HIGH (ref 6–20)
CO2: 22 mmol/L (ref 22–32)
Calcium: 8.2 mg/dL — ABNORMAL LOW (ref 8.9–10.3)
Chloride: 95 mmol/L — ABNORMAL LOW (ref 98–111)
Creatinine, Ser: 12.46 mg/dL — ABNORMAL HIGH (ref 0.61–1.24)
GFR calc Af Amer: 5 mL/min — ABNORMAL LOW
GFR calc non Af Amer: 4 mL/min — ABNORMAL LOW
Glucose, Bld: 65 mg/dL — ABNORMAL LOW (ref 70–99)
Potassium: 5 mmol/L (ref 3.5–5.1)
Sodium: 139 mmol/L (ref 135–145)
Total Bilirubin: 0.7 mg/dL (ref 0.3–1.2)
Total Protein: 7.8 g/dL (ref 6.5–8.1)

## 2019-06-01 LAB — LIPASE, BLOOD: Lipase: 696 U/L — ABNORMAL HIGH (ref 11–51)

## 2019-06-01 MED ORDER — ONDANSETRON HCL 4 MG/2ML IJ SOLN
4.0000 mg | Freq: Once | INTRAMUSCULAR | Status: AC
  Administered 2019-06-01: 4 mg via INTRAVENOUS
  Filled 2019-06-01 (×2): qty 2

## 2019-06-01 MED ORDER — PROCHLORPERAZINE EDISYLATE 10 MG/2ML IJ SOLN
10.0000 mg | Freq: Once | INTRAMUSCULAR | Status: AC
  Administered 2019-06-01: 10 mg via INTRAVENOUS
  Filled 2019-06-01: qty 2

## 2019-06-01 MED ORDER — MORPHINE SULFATE (PF) 4 MG/ML IV SOLN
6.0000 mg | Freq: Once | INTRAVENOUS | Status: AC
  Administered 2019-06-01: 6 mg via INTRAVENOUS
  Filled 2019-06-01: qty 2

## 2019-06-01 MED ORDER — ONDANSETRON HCL 4 MG PO TABS: 4.0000 mg | ORAL_TABLET | Freq: Three times a day (TID) | ORAL | 0 refills | Status: DC | PRN

## 2019-06-01 MED ORDER — OXYCODONE HCL 10 MG PO TABS: 10.0000 mg | ORAL_TABLET | Freq: Two times a day (BID) | ORAL | 0 refills | Status: DC | PRN

## 2019-06-01 MED ORDER — MORPHINE SULFATE (PF) 4 MG/ML IV SOLN
4.0000 mg | Freq: Once | INTRAVENOUS | Status: AC
  Administered 2019-06-01: 4 mg via INTRAVENOUS
  Filled 2019-06-01 (×2): qty 1

## 2019-06-01 MED FILL — oxyCODONE HCL 10 MG TABS: 10 | 3 days supply | Qty: 6 | Fill #0

## 2019-06-01 MED FILL — ONDANSETRON HCL 4 MG TABLET: 4 | 7 days supply | Qty: 20 | Fill #0

## 2019-06-01 NOTE — Discharge Summary (Signed)
Physician Discharge Summary  Frank [name] EVO:[id number] DOB: [birth year] DOA: 05/16/2019  PCP: Frank [name]  Admit date: 05/16/2019 Discharge date: 05/21/2019  Time spent: 35 minutes  Recommendations for Outpatient Follow-up:  LEFT AMA  Discharge Diagnoses:  Principal Problem:   Acute on Abd pain Active Problems:   DM (diabetes mellitus), type 2, uncontrolled, with renal complications (HCC)   Essential hypertension   Recurrent abdominal pain   Nonischemic cardiomyopathy (HCC)   Chronic pain   End-stage renal disease on hemodialysis (HCC)   Hyperkalemia   Volume overload   Uremia   Filed Weights   05/20/19 0719 05/20/19 1141 05/20/19 2015  Weight: 75.3 kg 72.2 kg 72.2 kg    History of present illness:  Frank [name] a53 y.o.male,with past medical history significant for end-stage renal disease on hemodialysis, chronic pancreatitis and chronic pain on pain control presented to the ED 10/25 with 5 days history of epigastric pain associated with nausea and vomiting. Patient missed several dialysis sessions , in the emergency room where he was found to be hyperkalemic at 6.4 with BUN of 94 and creatinine of 19. -Patient reported having intermittent flares of pancreatitis,  found to have mildly elevated lipase level on admission  Hospital Course:  #1.  Acute on chronic abdominal pain  -History of chronic pancreatitis reported, lipase was marginally elevated on admission, this has normalized now -Had 2 CT abdomen pelvis in the last month one on 9/29 and repeat 2 weeks ago 10/15 both of which were unremarkable, did not show evidence of pancreatic calcification, or acute abdominal findings, calcified failed renal transplant noted  -Gallbladder was unremarkable, no gallstones -Patient denies any history of alcohol abuse -Suspect uremia is also contributing to his chronic nausea and abdominal discomfort -Mild clinical improvement, urea and creatinine are improving as well,   advanced to full liquid diet, today pt left AMA immediately following hemodialysis, would benefit from CT with IV contrast if he is readmitted for similar complaints  #2. ESRD on dialysis and hyperkalemia. -With uremia, history of noncompliance -Nephrology following, dialyzed today  #3. Acute on chronic combined heart failure/Cardiomyopathy -Volume managed with hemodialysis, last echo with EF of 35%  #4. Hyperkalemia -Resolved with HD  #5. Hypertension.  -Improving with HD, continued on hydralazine, amlodipine, lisinopril  #6. Diabetes.  -Diet controlled, hemoglobin A1c  #7. Chronic pain.  -He was given oxycodone during his last visit to the ED on 10/24 for same   Discharge Exam: Vitals:   05/20/19 2015 05/21/19 0618  BP: (!) 156/75 (!) 165/94  Pulse: 90 88  Resp: 18 18  Temp: 98.2 F (36.8 C) 98.4 F (36.9 C)  SpO2: 100% 100%    General: AAOx3 Cardiovascular: S1S2/RRR Respiratory: CTAB  Discharge Instructions    Allergies as of 05/21/2019      Reactions   Butalbital-apap-caffeine Shortness Of Breath, Swelling, Other (See Comments)   Swelling in throat   Ferrlecit [na Ferric Gluc Cplx In Sucrose] Shortness Of Breath, Swelling, Other (See Comments)   Swelling in throat, tolerates Venofor   Minoxidil Shortness Of Breath   Tylenol [acetaminophen] Anaphylaxis, Swelling   Darvocet [propoxyphene N-acetaminophen] Hives      Medication List    ASK your doctor about these medications   amLODipine 5 MG tablet Commonly known as: NORVASC Take 1 tablet (5 mg total) by mouth daily.   apixaban 5 MG Tabs tablet Commonly known as: Eliquis Take 1 tablet (5 mg total) by mouth 2 (two) times daily.   diphenhydrAMINE  25 mg capsule Commonly known as: BENADRYL Take 25 mg by mouth every 8 (eight) hours as needed for itching.   hydrALAZINE 100 MG tablet Commonly known as: APRESOLINE Take 1 tablet (100 mg total) by mouth 3 (three) times daily.   Linzess 72 MCG  capsule Generic drug: linaclotide Take 144 mcg by mouth daily before breakfast. Rx  Sent for Prior Authorization request to Dr. Ricard Dillon, would only pay for 1 daily.   lisinopril 5 MG tablet Commonly known as: ZESTRIL Take 5 mg by mouth daily.   Nephro Vitamins 0.8 MG Tabs Take 1 tablet by mouth daily.   nitroGLYCERIN 0.4 MG SL tablet Commonly known as: NITROSTAT Place 1 tablet (0.4 mg total) under the tongue every 5 (five) minutes as needed for chest pain.   omeprazole 20 MG capsule Commonly known as: PRILOSEC Take 20 mg by mouth daily.   senna-docusate 8.6-50 MG tablet Commonly known as: Senokot-S Take 2 tablets by mouth at bedtime.      Allergies  Allergen Reactions  . Butalbital-Apap-Caffeine Shortness Of Breath, Swelling and Other (See Comments)    Swelling in throat  . Ferrlecit [Na Ferric Gluc Cplx In Sucrose] Shortness Of Breath, Swelling and Other (See Comments)    Swelling in throat, tolerates Venofor  . Minoxidil Shortness Of Breath  . Tylenol [Acetaminophen] Anaphylaxis and Swelling  . Darvocet [Propoxyphene N-Acetaminophen] Hives      The results of significant diagnostics from this hospitalization (including imaging, microbiology, ancillary and laboratory) are listed below for reference.    Significant Diagnostic Studies: Ct Abdomen Pelvis Wo Contrast  Result Date: 05/06/2019 CLINICAL DATA:  Increased abdominal distention. End-stage renal disease. Failed kidney transplant. Chronic pancreatitis. EXAM: CT ABDOMEN AND PELVIS WITHOUT CONTRAST TECHNIQUE: Multidetector CT imaging of the abdomen and pelvis was performed following the standard protocol without IV contrast. COMPARISON:  04/20/2019 FINDINGS: Lower chest: No acute findings. Stable right basilar pleural-parenchymal scarring with associated rounded atelectasis. Stable cardiomegaly and small pericardial effusion. Hepatobiliary: No mass visualized on this unenhanced exam. Gallbladder is unremarkable. No evidence  of biliary ductal dilatation. Pancreas: No mass or inflammatory process visualized on this unenhanced exam. Spleen:  Within normal limits in size. Adrenals/Urinary tract: Normal adrenal glands. Diffuse atrophy of native kidneys again seen, consistent with end-stage renal disease. 3 cm high attenuation left renal lesion is stable, consistent with hemorrhagic cyst. Failed renal transplant with calcification is again seen in the left iliac fossa. Stomach/Bowel: No evidence of obstruction, inflammatory process, or abnormal fluid collections. Vascular/Lymphatic: No pathologically enlarged lymph nodes identified. No evidence of abdominal aortic aneurysm. Aortic atherosclerosis. Reproductive:  No mass or other significant abnormality. Other:  None. Musculoskeletal:  No suspicious bone lesions identified. IMPRESSION: No acute findings within the abdomen or pelvis. Stable appearance of failed, partially calcified renal transplant in left iliac fossa. Severe atrophy of native kidneys, consistent with end-stage renal disease. No evidence of hydronephrosis. Aortic Atherosclerosis (ICD10-I70.0). Electronically Signed   By: Marlaine Hind M.D.   On: 05/06/2019 13:05   Dg Abd 1 View  Result Date: 05/18/2019 CLINICAL DATA:  Acute right-sided abdominal pain. EXAM: ABDOMEN - 1 VIEW COMPARISON:  May 06, 2019.  April 19, 2019. FINDINGS: The bowel gas pattern is normal. No radio-opaque calculi or other significant radiographic abnormality are seen. IMPRESSION: No evidence of bowel obstruction or ileus. Aortic Atherosclerosis (ICD10-I70.0). Electronically Signed   By: Marijo Conception M.D.   On: 05/18/2019 13:29   Dg Chest Port 1 View  Result Date: 05/17/2019 CLINICAL  DATA:  Short of breath.  Missed dialysis EXAM: PORTABLE CHEST 1 VIEW COMPARISON:  05/06/2019 FINDINGS: Cardiac enlargement. Progression of vascular congestion and mild edema. Mild to moderate right pleural effusion unchanged with right lower lobe atelectasis.  Surgical clips overlying the right hilum unchanged. IMPRESSION: Progression of fluid overload with mild edema. Right pleural effusion approximately unchanged. Electronically Signed   By: Franchot Gallo M.D.   On: 05/17/2019 11:17   Dg Chest Portable 1 View  Result Date: 05/06/2019 CLINICAL DATA:  Abdominal pain and vomiting EXAM: PORTABLE CHEST 1 VIEW COMPARISON:  04/17/2019, CT 04/20/2019 FINDINGS: Enlarged cardiac silhouette. Loculated pleural fluid andatelectasis seen at the RIGHT lung base as described on CT 04/20/2019. Mild central venous pulmonary congestion. No focal consolidation. No pneumothorax. IMPRESSION: 1. No interval change. 2. Marked cardiomegaly. 3. RIGHT pleural effusion with basilar loculation and atelectasis. Electronically Signed   By: Suzy Bouchard M.D.   On: 05/06/2019 09:04   Dg Abd Acute 2+v W 1v Chest  Result Date: 05/28/2019 CLINICAL DATA:  Pancreatitis EXAM: DG ABDOMEN ACUTE W/ 1V CHEST COMPARISON:  05/18/2019 FINDINGS: Cardiomegaly and vascular pedicle widening. Airspace opacity with pleural fluid at the right base. Normal bowel gas pattern. No evidence of pneumoperitoneum. No concerning mass effect but limited by overlapping artifact. Extensive arterial calcification IMPRESSION: 1. Normal bowel gas pattern.  No evidence of pneumoperitoneum. 2. Chronic pleuroparenchymal opacification at the right base. Electronically Signed   By: Monte Fantasia M.D.   On: 05/28/2019 06:02    Microbiology: Recent Results (from the past 240 hour(s))  SARS CORONAVIRUS 2 (TAT 6-24 HRS) Nasopharyngeal Nasopharyngeal Swab     Status: None   Collection Time: 05/28/19  4:58 AM   Specimen: Nasopharyngeal Swab  Result Value Ref Range Status   SARS Coronavirus 2 NEGATIVE NEGATIVE Final    Comment: (NOTE) SARS-CoV-2 target nucleic acids are NOT DETECTED. The SARS-CoV-2 RNA is generally detectable in upper and lower respiratory specimens during the acute phase of infection. Negative results  do not preclude SARS-CoV-2 infection, do not rule out co-infections with other pathogens, and should not be used as the sole basis for treatment or other patient management decisions. Negative results must be combined with clinical observations, patient history, and epidemiological information. The expected result is Negative. Fact Sheet for Patients: SugarRoll[employer]be Fact Sheet for Healthcare Providers: https://www.woods-mathews.com/ This test is not yet approved or cleared by the [place] FDA and  has been authorized for detection and/or diagnosis of SARS-CoV-2 by FDA under an Emergency Use Authorization (EUA). This EUA will remain  in effect (meaning this test can be used) for the duration of the COVID-19 declaration under Section 56 4(b)(1) of the Act, 21 U.S.C. section 360bbb-3(b)(1), unless the authorization is terminated or revoked sooner. Performed at [facility], [place] [address], [place],  [address]      Labs: Basic Metabolic Panel: Recent Labs  Lab 05/28/19 0218 05/30/19 1139 06/01/19 0450  NA 138 137 139  K 6.1* 4.7 5.0  CL 98 98 95*  CO2 17* 21* 22  GLUCOSE 75 84 65*  BUN 97* 77* 68*  CREATININE 16.90* 15.24* 12.46*  CALCIUM 7.9* 8.2* 8.2*  PHOS  --  7.6*  --    Liver Function Tests: Recent Labs  Lab 05/28/19 0218 05/30/19 1139 06/01/19 0450  AST 20  --  23  ALT 12  --  12  ALKPHOS 227*  --  224*  BILITOT 1.0  --  0.7  PROT 8.4*  --  7.8  ALBUMIN 3.9 3.1* 3.4*   Recent Labs  Lab 05/28/19 0218 06/01/19 0450  LIPASE 949* 696*   No results for input(s): AMMONIA in the last 168 hours. CBC: Recent Labs  Lab 05/28/19 0218 05/30/19 1139 06/01/19 0450  WBC 4.0 3.7* 3.6*  NEUTROABS  --   --  2.0  HGB 10.8* 9.7* 10.5*  HCT 35.8* 30.7* 33.1*  MCV 91.1 88.5 89.5  PLT 189 154 172   Cardiac Enzymes: No results for input(s): CKTOTAL, CKMB, CKMBINDEX, TROPONINI in the last 168 hours. BNP: BNP  (last 3 results) No results for input(s): BNP in the last 8760 hours.  ProBNP (last 3 results) No results for input(s): PROBNP in the last 8760 hours.  CBG: Recent Labs  Lab 05/29/19 2334 05/30/19 0518 05/30/19 0623 05/30/19 0828 05/30/19 1350  GLUCAP 72 68* 127* 91 80       Signed:  Domenic Polite MD.  [facility] 06/01/2019, 3:11 PM
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
## 2019-06-01 NOTE — ED Provider Notes (Signed)
Patient signed out to me at 7 AM.  History of end-stage renal disease on hemodialysis, chronic pancreatitis who was recently admitted for acute pancreatitis who has ongoing abdominal pain, nausea, vomiting.  Getting labs and getting IV antiemetics and pain medicine.  Lab work shows no significant anemia or electrolyte abnormality.  Creatinine overall at baseline.  Lipase is downtrending in the 600s.  Patient felt much better after IV pain medicine and IV antiemetics.  He prefers another trial at home with oral pain medicine and antiemetics.  States that he does not have any nausea medicine left at home.  He is a chronic pain management patient and is chronically on oxycodone's.  We will give him a small amount of oxycodone for breakthrough pain in addition to his chronic pain medicine to help during this time.  He does not prefer admission at this time.  He is able to tolerate p.o.  Gave him further education on how to advance his diet.  He understands and will return if symptoms get worse.  This chart was dictated using voice recognition software.  Despite best efforts to proofread,  errors can occur which can change the documentation meaning.     Lennice Sites, DO 06/01/19 [phone]

## 2019-06-01 NOTE — ED Notes (Signed)
This nurse attempted IV stick unsuccessfully, pt is adamant about where and how he is stuck and known difficult stick. Pt is also a dialysis pt. IV team consulted.

## 2019-06-01 NOTE — Discharge Instructions (Addendum)
Slowly advance diet as we discussed.

## 2019-06-02 ENCOUNTER — Other Ambulatory Visit: Payer: Self-pay

## 2019-06-02 DIAGNOSIS — E875 Hyperkalemia: Secondary | ICD-10-CM | POA: Diagnosis present

## 2019-06-02 DIAGNOSIS — K860 Alcohol-induced chronic pancreatitis: Secondary | ICD-10-CM | POA: Diagnosis not present

## 2019-06-02 DIAGNOSIS — K7460 Unspecified cirrhosis of liver: Secondary | ICD-10-CM | POA: Diagnosis present

## 2019-06-02 DIAGNOSIS — I428 Other cardiomyopathies: Secondary | ICD-10-CM | POA: Diagnosis present

## 2019-06-02 DIAGNOSIS — K859 Acute pancreatitis without necrosis or infection, unspecified: Principal | ICD-10-CM | POA: Diagnosis present

## 2019-06-02 DIAGNOSIS — Z86718 Personal history of other venous thrombosis and embolism: Secondary | ICD-10-CM

## 2019-06-02 DIAGNOSIS — N186 End stage renal disease: Secondary | ICD-10-CM | POA: Diagnosis present

## 2019-06-02 DIAGNOSIS — E8889 Other specified metabolic disorders: Secondary | ICD-10-CM | POA: Diagnosis present

## 2019-06-02 DIAGNOSIS — K219 Gastro-esophageal reflux disease without esophagitis: Secondary | ICD-10-CM | POA: Diagnosis present

## 2019-06-02 DIAGNOSIS — Z20828 Contact with and (suspected) exposure to other viral communicable diseases: Secondary | ICD-10-CM | POA: Diagnosis present

## 2019-06-02 DIAGNOSIS — D631 Anemia in chronic kidney disease: Secondary | ICD-10-CM | POA: Diagnosis present

## 2019-06-02 DIAGNOSIS — Z86711 Personal history of pulmonary embolism: Secondary | ICD-10-CM

## 2019-06-02 DIAGNOSIS — I5042 Chronic combined systolic (congestive) and diastolic (congestive) heart failure: Secondary | ICD-10-CM | POA: Diagnosis present

## 2019-06-02 DIAGNOSIS — E1122 Type 2 diabetes mellitus with diabetic chronic kidney disease: Secondary | ICD-10-CM | POA: Diagnosis present

## 2019-06-02 DIAGNOSIS — Z87891 Personal history of nicotine dependence: Secondary | ICD-10-CM

## 2019-06-02 DIAGNOSIS — I120 Hypertensive chronic kidney disease with stage 5 chronic kidney disease or end stage renal disease: Secondary | ICD-10-CM | POA: Diagnosis not present

## 2019-06-02 DIAGNOSIS — K861 Other chronic pancreatitis: Secondary | ICD-10-CM | POA: Diagnosis present

## 2019-06-02 DIAGNOSIS — Z7901 Long term (current) use of anticoagulants: Secondary | ICD-10-CM

## 2019-06-02 DIAGNOSIS — E785 Hyperlipidemia, unspecified: Secondary | ICD-10-CM | POA: Diagnosis present

## 2019-06-02 DIAGNOSIS — Z940 Kidney transplant status: Secondary | ICD-10-CM

## 2019-06-02 DIAGNOSIS — Z992 Dependence on renal dialysis: Secondary | ICD-10-CM

## 2019-06-02 DIAGNOSIS — G8929 Other chronic pain: Secondary | ICD-10-CM | POA: Diagnosis present

## 2019-06-02 DIAGNOSIS — I132 Hypertensive heart and chronic kidney disease with heart failure and with stage 5 chronic kidney disease, or end stage renal disease: Secondary | ICD-10-CM | POA: Diagnosis present

## 2019-06-02 IMAGING — CR DG CHEST 2V
2 series · 2 of 2 positions shown · non-contrast
Comparison: Radiograph 11/29/2016

CLINICAL DATA: Chest pain.  Weakness for 1 day.

EXAM:
CHEST  2 VIEW

[chest lat]
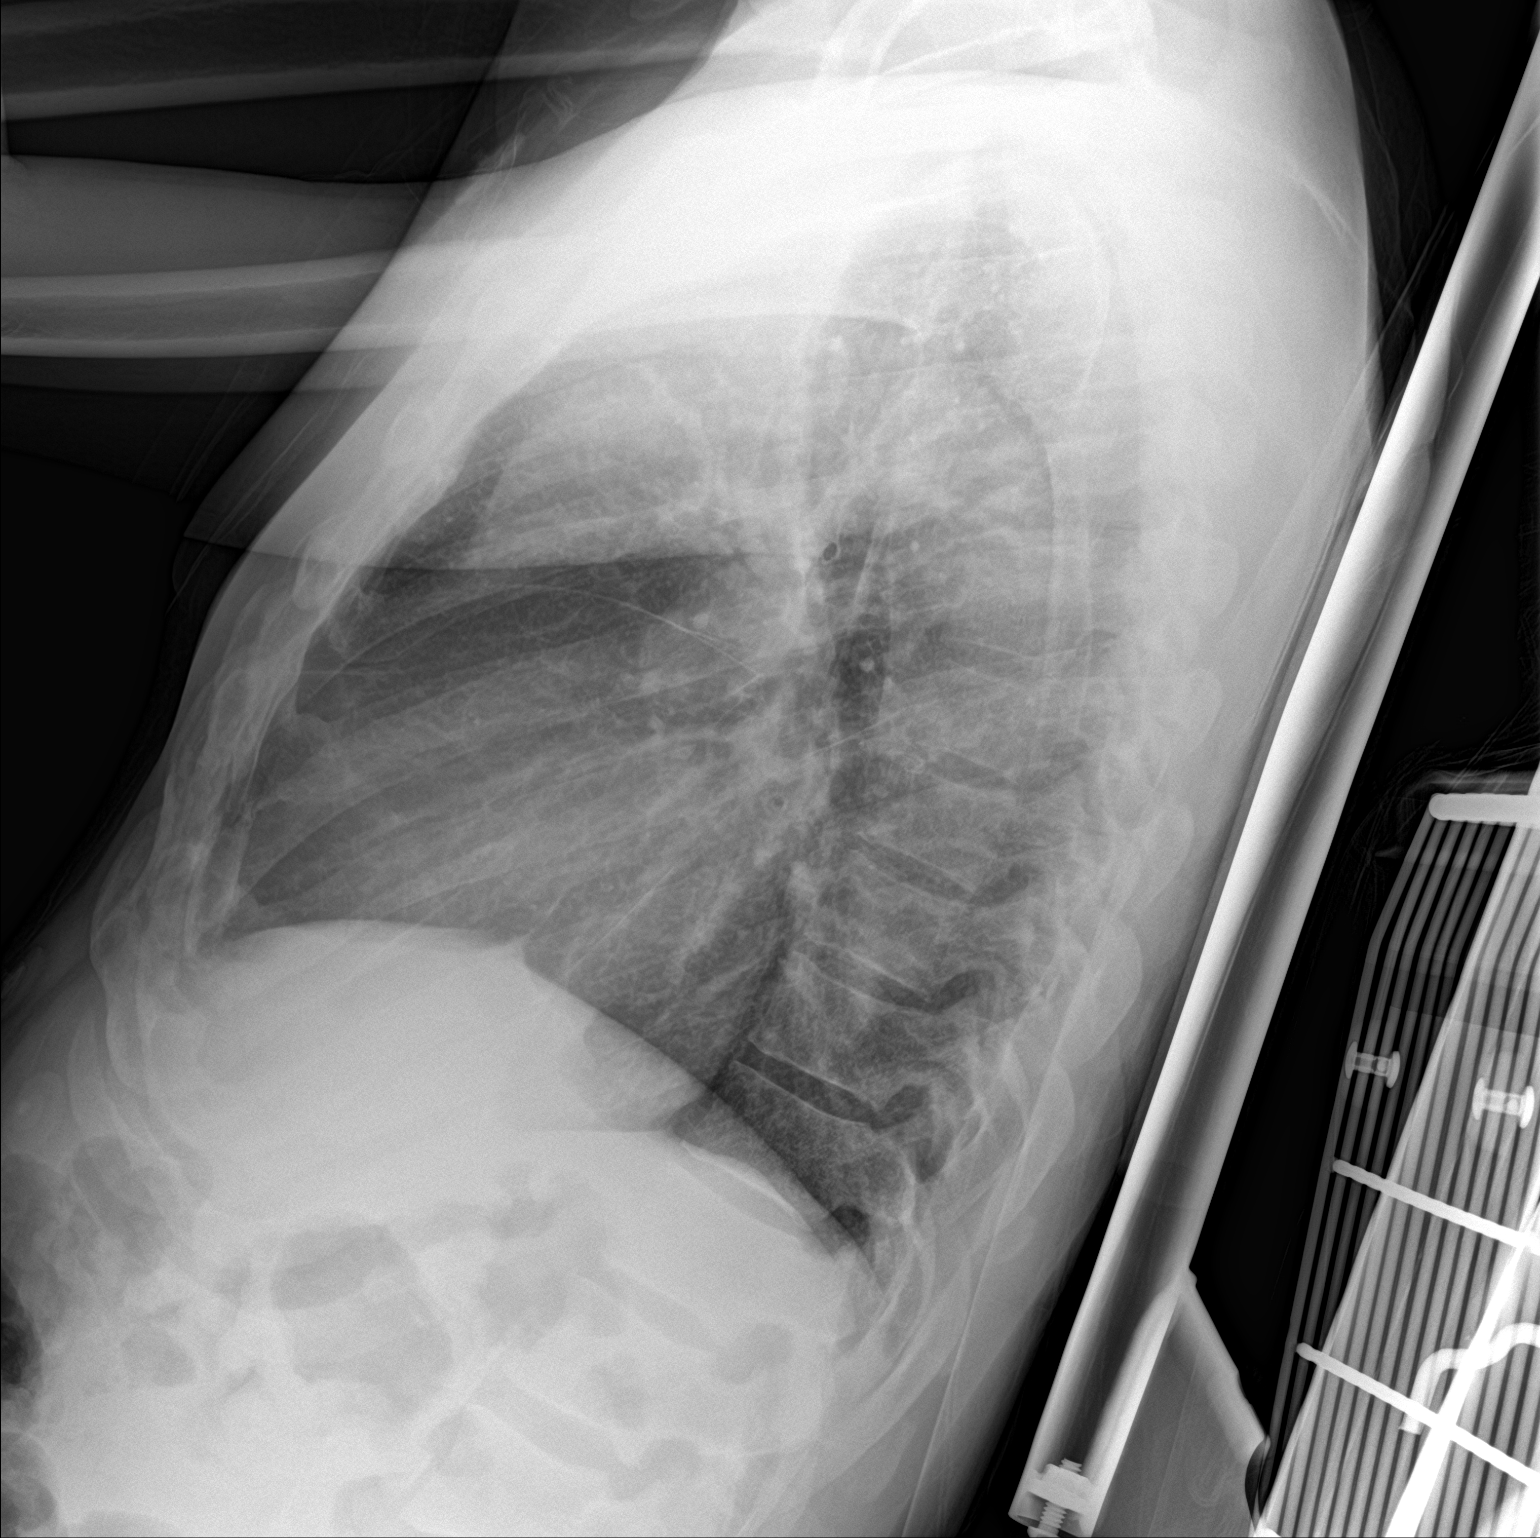

[chest ap]
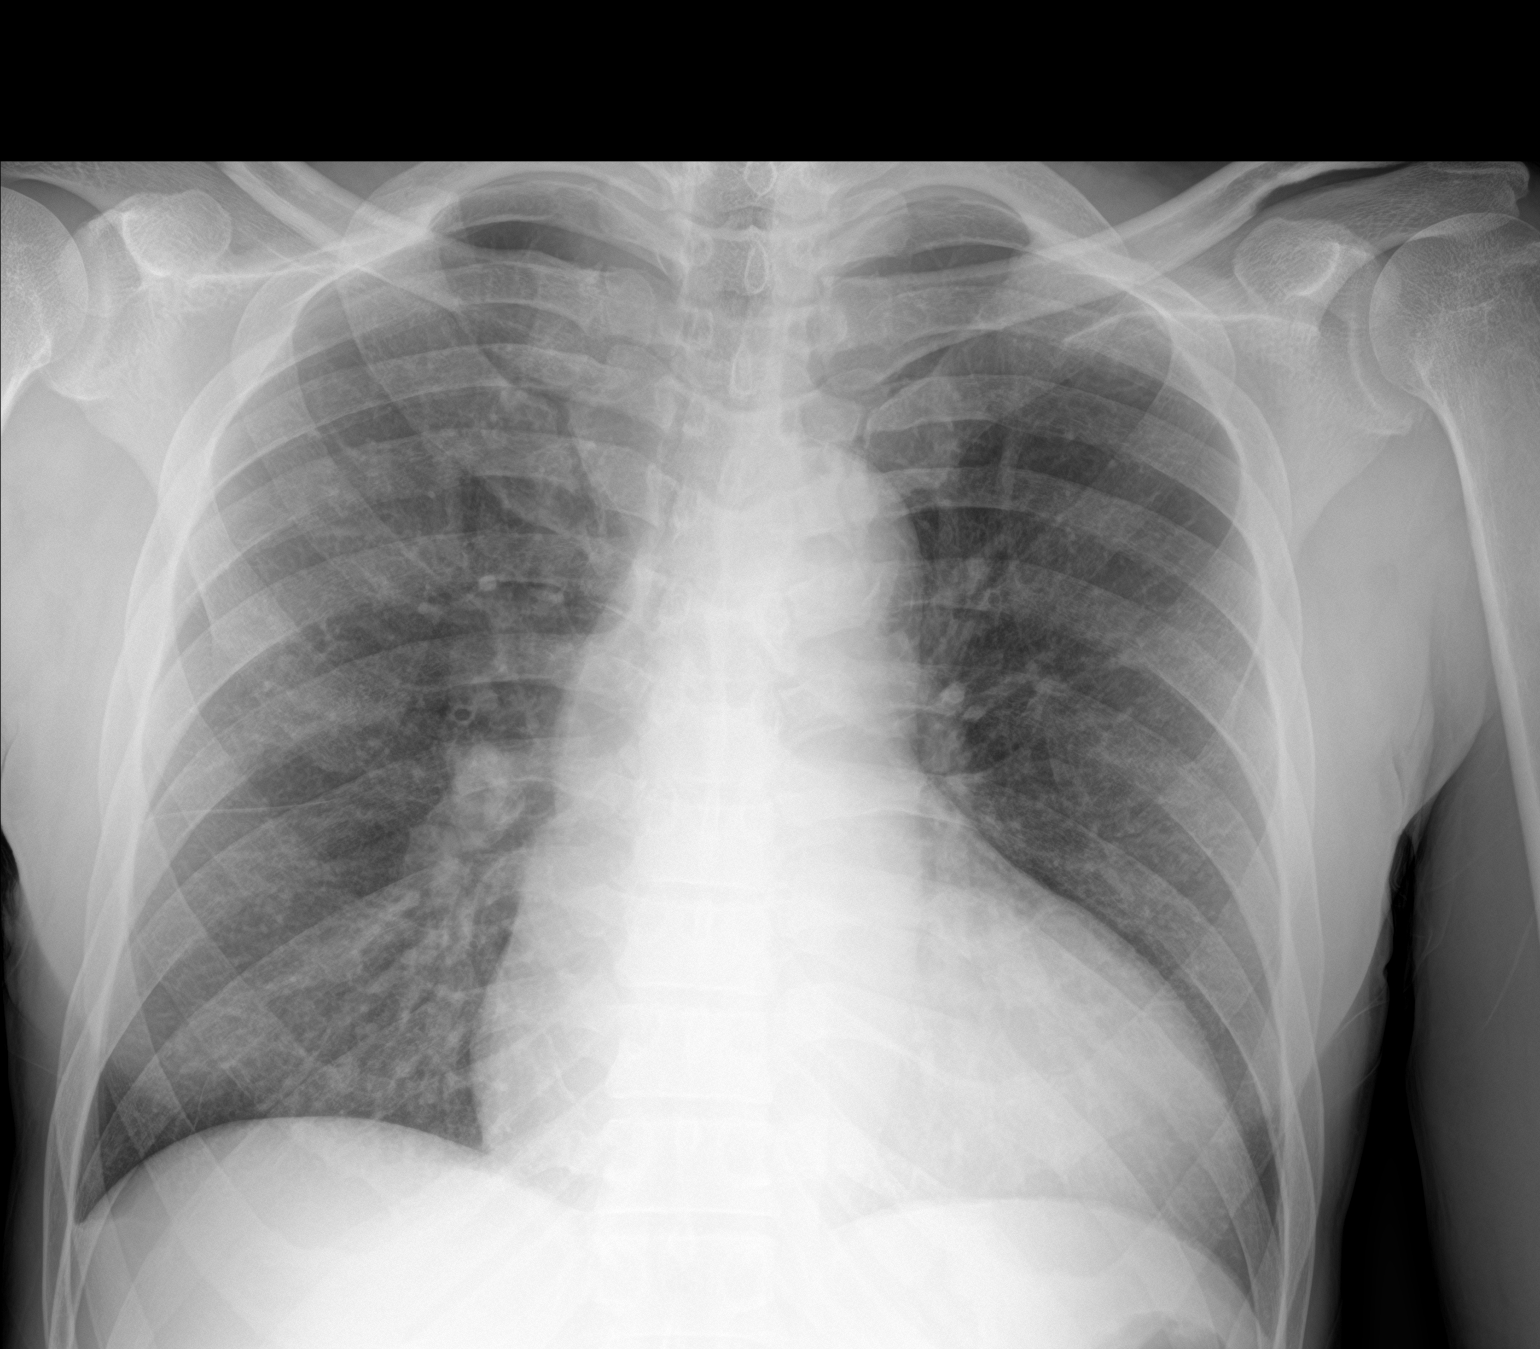

[2 of 2 positions shown; findings below may reference images not displayed]

FINDINGS: Stable cardiomegaly and mediastinal contours. Again seen vascular
congestion. Mild increase in pulmonary edema. Trace fluid in the
right minor fissure and blunting of the both costophrenic angles,
small pleural effusions. No confluent airspace disease. No
pneumothorax.
IMPRESSION: Increased pulmonary edema, small pleural effusions. Stable
cardiomegaly.

## 2019-06-03 ENCOUNTER — Inpatient Hospital Stay (HOSPITAL_COMMUNITY)
Admission: EM | Admit: 2019-06-03 | Discharge: 2019-06-07 | DRG: 438 | Disposition: A | Discharge status: Home / Self Care | Payer: Medicare Other | Attending: Internal Medicine | Admitting: Internal Medicine

## 2019-06-03 ENCOUNTER — Emergency Department (HOSPITAL_COMMUNITY): Payer: Medicare Other

## 2019-06-03 ENCOUNTER — Encounter (HOSPITAL_COMMUNITY): Payer: Self-pay

## 2019-06-03 DIAGNOSIS — K8500 Idiopathic acute pancreatitis without necrosis or infection: Secondary | ICD-10-CM | POA: Diagnosis not present

## 2019-06-03 DIAGNOSIS — E875 Hyperkalemia: Secondary | ICD-10-CM | POA: Diagnosis not present

## 2019-06-03 DIAGNOSIS — Z9115 Patient's noncompliance with renal dialysis: Secondary | ICD-10-CM | POA: Diagnosis not present

## 2019-06-03 DIAGNOSIS — E785 Hyperlipidemia, unspecified: Secondary | ICD-10-CM | POA: Diagnosis not present

## 2019-06-03 DIAGNOSIS — Z86718 Personal history of other venous thrombosis and embolism: Secondary | ICD-10-CM | POA: Diagnosis not present

## 2019-06-03 DIAGNOSIS — I132 Hypertensive heart and chronic kidney disease with heart failure and with stage 5 chronic kidney disease, or end stage renal disease: Secondary | ICD-10-CM | POA: Diagnosis not present

## 2019-06-03 DIAGNOSIS — Z20828 Contact with and (suspected) exposure to other viral communicable diseases: Secondary | ICD-10-CM | POA: Diagnosis not present

## 2019-06-03 DIAGNOSIS — I120 Hypertensive chronic kidney disease with stage 5 chronic kidney disease or end stage renal disease: Secondary | ICD-10-CM | POA: Diagnosis not present

## 2019-06-03 DIAGNOSIS — G8929 Other chronic pain: Secondary | ICD-10-CM | POA: Diagnosis present

## 2019-06-03 DIAGNOSIS — Z86711 Personal history of pulmonary embolism: Secondary | ICD-10-CM | POA: Diagnosis not present

## 2019-06-03 DIAGNOSIS — K219 Gastro-esophageal reflux disease without esophagitis: Secondary | ICD-10-CM | POA: Diagnosis not present

## 2019-06-03 DIAGNOSIS — Z940 Kidney transplant status: Secondary | ICD-10-CM | POA: Diagnosis not present

## 2019-06-03 DIAGNOSIS — J90 Pleural effusion, not elsewhere classified: Secondary | ICD-10-CM | POA: Diagnosis not present

## 2019-06-03 DIAGNOSIS — E8889 Other specified metabolic disorders: Secondary | ICD-10-CM | POA: Diagnosis present

## 2019-06-03 DIAGNOSIS — R1013 Epigastric pain: Secondary | ICD-10-CM | POA: Diagnosis not present

## 2019-06-03 DIAGNOSIS — K861 Other chronic pancreatitis: Secondary | ICD-10-CM | POA: Diagnosis present

## 2019-06-03 DIAGNOSIS — K859 Acute pancreatitis without necrosis or infection, unspecified: Secondary | ICD-10-CM | POA: Diagnosis not present

## 2019-06-03 DIAGNOSIS — I428 Other cardiomyopathies: Secondary | ICD-10-CM | POA: Diagnosis not present

## 2019-06-03 DIAGNOSIS — N2581 Secondary hyperparathyroidism of renal origin: Secondary | ICD-10-CM | POA: Diagnosis not present

## 2019-06-03 DIAGNOSIS — Z992 Dependence on renal dialysis: Secondary | ICD-10-CM | POA: Diagnosis not present

## 2019-06-03 DIAGNOSIS — I10 Essential (primary) hypertension: Secondary | ICD-10-CM | POA: Diagnosis not present

## 2019-06-03 DIAGNOSIS — N186 End stage renal disease: Secondary | ICD-10-CM | POA: Diagnosis not present

## 2019-06-03 DIAGNOSIS — R112 Nausea with vomiting, unspecified: Secondary | ICD-10-CM | POA: Diagnosis not present

## 2019-06-03 DIAGNOSIS — E1122 Type 2 diabetes mellitus with diabetic chronic kidney disease: Secondary | ICD-10-CM | POA: Diagnosis not present

## 2019-06-03 DIAGNOSIS — Z7901 Long term (current) use of anticoagulants: Secondary | ICD-10-CM | POA: Diagnosis not present

## 2019-06-03 DIAGNOSIS — I5022 Chronic systolic (congestive) heart failure: Secondary | ICD-10-CM | POA: Diagnosis not present

## 2019-06-03 DIAGNOSIS — K860 Alcohol-induced chronic pancreatitis: Secondary | ICD-10-CM

## 2019-06-03 DIAGNOSIS — I5042 Chronic combined systolic (congestive) and diastolic (congestive) heart failure: Secondary | ICD-10-CM | POA: Diagnosis not present

## 2019-06-03 DIAGNOSIS — E8770 Fluid overload, unspecified: Secondary | ICD-10-CM | POA: Diagnosis not present

## 2019-06-03 DIAGNOSIS — K7460 Unspecified cirrhosis of liver: Secondary | ICD-10-CM | POA: Diagnosis not present

## 2019-06-03 DIAGNOSIS — D631 Anemia in chronic kidney disease: Secondary | ICD-10-CM | POA: Diagnosis not present

## 2019-06-03 DIAGNOSIS — Z87891 Personal history of nicotine dependence: Secondary | ICD-10-CM | POA: Diagnosis not present

## 2019-06-03 LAB — BASIC METABOLIC PANEL
Anion gap: 23 — ABNORMAL HIGH (ref 5–15)
BUN: 100 mg/dL — ABNORMAL HIGH (ref 6–20)
CO2: 20 mmol/L — ABNORMAL LOW (ref 22–32)
Calcium: 8.2 mg/dL — ABNORMAL LOW (ref 8.9–10.3)
Chloride: 95 mmol/L — ABNORMAL LOW (ref 98–111)
Creatinine, Ser: 17.12 mg/dL — ABNORMAL HIGH (ref 0.61–1.24)
GFR calc Af Amer: 3 mL/min — ABNORMAL LOW (ref >60)
GFR calc non Af Amer: 3 mL/min — ABNORMAL LOW (ref >60)
Glucose, Bld: 77 mg/dL (ref 70–99)
Potassium: 6.7 mmol/L (ref 3.5–5.1)
Sodium: 138 mmol/L (ref 135–145)

## 2019-06-03 LAB — SARS CORONAVIRUS 2 (TAT 6-24 HRS): SARS Coronavirus 2: NEGATIVE

## 2019-06-03 LAB — CBG MONITORING, ED: Glucose-Capillary: 134 mg/dL — ABNORMAL HIGH (ref 70–99)

## 2019-06-03 LAB — GLUCOSE, CAPILLARY
Glucose-Capillary: 120 mg/dL — ABNORMAL HIGH (ref 70–99)
Glucose-Capillary: 43 mg/dL — CL (ref 70–99)
Glucose-Capillary: 66 mg/dL — ABNORMAL LOW (ref 70–99)

## 2019-06-03 LAB — COMPREHENSIVE METABOLIC PANEL
ALT: 13 U/L (ref 0–44)
AST: 13 U/L — ABNORMAL LOW (ref 15–41)
Albumin: 3.6 g/dL (ref 3.5–5.0)
Alkaline Phosphatase: 203 U/L — ABNORMAL HIGH (ref 38–126)
Anion gap: 27 — ABNORMAL HIGH (ref 5–15)
BUN: 91 mg/dL — ABNORMAL HIGH (ref 6–20)
CO2: 16 mmol/L — ABNORMAL LOW (ref 22–32)
Calcium: 7.9 mg/dL — ABNORMAL LOW (ref 8.9–10.3)
Chloride: 95 mmol/L — ABNORMAL LOW (ref 98–111)
Creatinine, Ser: 15.67 mg/dL — ABNORMAL HIGH (ref 0.61–1.24)
GFR calc Af Amer: 4 mL/min — ABNORMAL LOW (ref >60)
GFR calc non Af Amer: 3 mL/min — ABNORMAL LOW (ref >60)
Glucose, Bld: 72 mg/dL (ref 70–99)
Potassium: 6.5 mmol/L (ref 3.5–5.1)
Sodium: 138 mmol/L (ref 135–145)
Total Bilirubin: 1 mg/dL (ref 0.3–1.2)
Total Protein: 7.9 g/dL (ref 6.5–8.1)

## 2019-06-03 LAB — CBC
HCT: 34.6 % — ABNORMAL LOW (ref 39.0–52.0)
Hemoglobin: 10.6 g/dL — ABNORMAL LOW (ref 13.0–17.0)
MCH: 27.7 pg (ref 26.0–34.0)
MCHC: 30.6 g/dL (ref 30.0–36.0)
MCV: 90.6 fL (ref 80.0–100.0)
Platelets: 161 10*3/uL (ref 150–400)
RBC: 3.82 MIL/uL — ABNORMAL LOW (ref 4.22–5.81)
RDW: 16.4 % — ABNORMAL HIGH (ref 11.5–15.5)
WBC: 4.5 10*3/uL (ref 4.0–10.5)
nRBC: 0 % (ref 0.0–0.2)

## 2019-06-03 LAB — LIPASE, BLOOD: Lipase: 231 U/L — ABNORMAL HIGH (ref 11–51)

## 2019-06-03 MED ORDER — HYDROMORPHONE HCL 1 MG/ML IJ SOLN
1.0000 mg | INTRAMUSCULAR | Status: DC | PRN
  Administered 2019-06-03: 1 mg via INTRAVENOUS
  Filled 2019-06-03: qty 1

## 2019-06-03 MED ORDER — HYDROMORPHONE HCL 1 MG/ML IJ SOLN
0.5000 mg | Freq: Once | INTRAMUSCULAR | Status: AC
  Administered 2019-06-03: 0.5 mg via INTRAVENOUS
  Filled 2019-06-03: qty 1

## 2019-06-03 MED ORDER — SODIUM ZIRCONIUM CYCLOSILICATE 5 G PO PACK
5.0000 g | PACK | Freq: Once | ORAL | Status: AC
  Administered 2019-06-03: 08:00:00 5 g via ORAL
  Filled 2019-06-03: qty 1

## 2019-06-03 MED ORDER — HYDROMORPHONE HCL 1 MG/ML IJ SOLN
1.0000 mg | Freq: Once | INTRAMUSCULAR | Status: AC
  Administered 2019-06-03: 1 mg via INTRAVENOUS
  Filled 2019-06-03: qty 1

## 2019-06-03 MED ORDER — LORAZEPAM 2 MG/ML IJ SOLN
1.0000 mg | Freq: Once | INTRAMUSCULAR | Status: AC
  Administered 2019-06-03: 09:00:00 1 mg via INTRAVENOUS
  Filled 2019-06-03: qty 1

## 2019-06-03 MED ORDER — PANTOPRAZOLE SODIUM 40 MG IV SOLR
40.0000 mg | Freq: Two times a day (BID) | INTRAVENOUS | Status: DC
  Administered 2019-06-03 – 2019-06-06 (×8): 40 mg via INTRAVENOUS
  Filled 2019-06-03 (×8): qty 40

## 2019-06-03 MED ORDER — DEXTROSE 50 % IV SOLN
INTRAVENOUS | Status: AC
  Administered 2019-06-04: 05:00:00 25 mL
  Filled 2019-06-03: qty 50

## 2019-06-03 MED ORDER — AMLODIPINE BESYLATE 5 MG PO TABS
5.0000 mg | ORAL_TABLET | Freq: Every day | ORAL | Status: DC
  Administered 2019-06-03 – 2019-06-04 (×2): 5 mg via ORAL
  Filled 2019-06-03 (×2): qty 1

## 2019-06-03 MED ORDER — SODIUM POLYSTYRENE SULFONATE 15 GM/60ML PO SUSP: 30.0000 g | Freq: Once | ORAL | Status: DC

## 2019-06-03 MED ORDER — HYDROMORPHONE HCL 1 MG/ML IJ SOLN
1.0000 mg | Freq: Once | INTRAMUSCULAR | Status: AC
  Administered 2019-06-03: 23:00:00 1 mg via INTRAVENOUS
  Filled 2019-06-03: qty 1

## 2019-06-03 MED ORDER — HYDROMORPHONE HCL 1 MG/ML IJ SOLN
1.0000 mg | Freq: Once | INTRAMUSCULAR | Status: AC
  Administered 2019-06-03: 03:00:00 1 mg via INTRAMUSCULAR
  Filled 2019-06-03: qty 1

## 2019-06-03 MED ORDER — DEXTROSE 50 % IV SOLN
1.0000 | Freq: Once | INTRAVENOUS | Status: AC
  Administered 2019-06-03: 20:00:00 50 mL via INTRAVENOUS
  Filled 2019-06-03 (×2): qty 50

## 2019-06-03 MED ORDER — ALBUTEROL SULFATE (2.5 MG/3ML) 0.083% IN NEBU: 10.0000 mg | INHALATION_SOLUTION | Freq: Once | RESPIRATORY_TRACT | Status: DC

## 2019-06-03 MED ORDER — SODIUM CHLORIDE 0.9% FLUSH: 3.0000 mL | Freq: Once | INTRAVENOUS | Status: DC

## 2019-06-03 MED ORDER — LORAZEPAM 2 MG/ML IJ SOLN
0.5000 mg | Freq: Once | INTRAMUSCULAR | Status: AC
  Administered 2019-06-03: 15:00:00 0.5 mg via INTRAVENOUS
  Filled 2019-06-03: qty 1

## 2019-06-03 MED ORDER — ONDANSETRON 4 MG PO TBDP
4.0000 mg | ORAL_TABLET | Freq: Once | ORAL | Status: AC
  Administered 2019-06-03: 4 mg via ORAL
  Filled 2019-06-03: qty 1

## 2019-06-03 MED ORDER — CHLORHEXIDINE GLUCONATE CLOTH 2 % EX PADS: 6.0000 | MEDICATED_PAD | Freq: Every day | CUTANEOUS | Status: DC

## 2019-06-03 MED ORDER — SODIUM BICARBONATE 8.4 % IV SOLN
50.0000 meq | Freq: Once | INTRAVENOUS | Status: AC
  Administered 2019-06-03: 50 meq via INTRAVENOUS
  Filled 2019-06-03: qty 50

## 2019-06-03 MED ORDER — DEXTROSE 50 % IV SOLN
INTRAVENOUS | Status: AC
  Administered 2019-06-03: 25 mL
  Filled 2019-06-03: qty 50

## 2019-06-03 MED ORDER — INSULIN ASPART 100 UNIT/ML IV SOLN
5.0000 [IU] | Freq: Once | INTRAVENOUS | Status: AC
  Administered 2019-06-03: 5 [IU] via INTRAVENOUS
  Filled 2019-06-03: qty 0.05

## 2019-06-03 MED ORDER — SENNOSIDES-DOCUSATE SODIUM 8.6-50 MG PO TABS
1.0000 | ORAL_TABLET | Freq: Every evening | ORAL | Status: DC | PRN
  Filled 2019-06-03: qty 1

## 2019-06-03 MED ORDER — HYDROMORPHONE HCL 1 MG/ML IJ SOLN
1.0000 mg | INTRAMUSCULAR | Status: DC | PRN
  Administered 2019-06-04 – 2019-06-05 (×9): 1 mg via INTRAVENOUS
  Filled 2019-06-03 (×8): qty 1

## 2019-06-03 MED ORDER — ONDANSETRON HCL 4 MG/2ML IJ SOLN
4.0000 mg | Freq: Four times a day (QID) | INTRAMUSCULAR | Status: DC | PRN
  Administered 2019-06-03 – 2019-06-07 (×12): 4 mg via INTRAVENOUS
  Filled 2019-06-03 (×12): qty 2

## 2019-06-03 MED ORDER — SODIUM CHLORIDE 0.9 % IV SOLN
1.0000 g | Freq: Once | INTRAVENOUS | Status: DC
  Filled 2019-06-03: qty 10

## 2019-06-03 MED ORDER — DEXTROSE 50 % IV SOLN
INTRAVENOUS | Status: AC
  Administered 2019-06-03: 23:00:00 50 mL
  Filled 2019-06-03: qty 50

## 2019-06-03 MED ORDER — LINACLOTIDE 72 MCG PO CAPS
144.0000 ug | ORAL_CAPSULE | Freq: Every day | ORAL | Status: DC
  Administered 2019-06-03 – 2019-06-07 (×3): 144 ug via ORAL
  Filled 2019-06-03 (×5): qty 2

## 2019-06-03 MED ORDER — SODIUM ZIRCONIUM CYCLOSILICATE 5 G PO PACK
5.0000 g | PACK | Freq: Once | ORAL | Status: AC
  Administered 2019-06-03: 5 g via ORAL
  Filled 2019-06-03: qty 1

## 2019-06-03 MED ORDER — APIXABAN 5 MG PO TABS
5.0000 mg | ORAL_TABLET | Freq: Two times a day (BID) | ORAL | Status: DC
  Administered 2019-06-03 – 2019-06-07 (×8): 5 mg via ORAL
  Filled 2019-06-03 (×8): qty 1

## 2019-06-03 MED ORDER — CALCIUM GLUCONATE-NACL 1-0.675 GM/50ML-% IV SOLN
1.0000 g | Freq: Once | INTRAVENOUS | Status: AC
  Administered 2019-06-03: 1000 mg via INTRAVENOUS
  Filled 2019-06-03: qty 50

## 2019-06-03 MED ORDER — SODIUM ZIRCONIUM CYCLOSILICATE 10 G PO PACK
10.0000 g | PACK | Freq: Three times a day (TID) | ORAL | Status: DC
  Administered 2019-06-04 (×3): 10 g via ORAL
  Filled 2019-06-03 (×8): qty 1

## 2019-06-03 MED ORDER — ONDANSETRON HCL 4 MG/2ML IJ SOLN
4.0000 mg | Freq: Once | INTRAMUSCULAR | Status: DC
  Filled 2019-06-03: qty 2

## 2019-06-03 MED ORDER — HYDROMORPHONE HCL 1 MG/ML IJ SOLN
1.0000 mg | Freq: Once | INTRAMUSCULAR | Status: DC
  Filled 2019-06-03: qty 1

## 2019-06-03 NOTE — ED Provider Notes (Signed)
Pt w/ hx of alcoholic pancreatitis here with abd pain likely 2/2 to pancreatitis.  Lipase is improving.  Currently awaits for labs including K+.  Report last dialysis was 2 days ago.    7:49 AM Patient report he was approximately an hour short of his dialysis session 2 days ago due to having abdominal pain.  Today his lipase is 231, improved from 600.  However, potassium is elevated at 6.5, and a chest x-ray shows pleural effusion worse than previous.  Patient still endorse abdominal discomfort.  Will reach out to nephrologist to help schedule dialysis session.  Will consult for admission.  8:24 AM Appreciate consultation from nephrologist Dr. Jonnie Finner, who request patient to receive 10 g of Lokelma as well as an amp of calcium gluconate, will consult for admission.  8:31 AM Patient endorsed nausea, unfortunately EKG shows prolonged QT therefore we will avoid any medication that can worsen his QT.  8:53 AM Appreciate consultation from [facility] Dr. Andria Frames who agrees to see and admit pt.    CRITICAL CARE Performed by: [name] Total critical care time: 35 minutes Critical care time was exclusive of separately billable procedures and treating other patients. Critical care was necessary to treat or prevent imminent or life-threatening deterioration. Critical care was time spent personally by me on the following activities: development of treatment plan with patient and/or surrogate as well as nursing, discussions with consultants, evaluation of patient's response to treatment, examination of patient, obtaining history from patient or surrogate, ordering and performing treatments and interventions, ordering and review of laboratory studies, ordering and review of radiographic studies, pulse oximetry and re-evaluation of patient's condition.   BP (!) 170/67   Pulse 84   Temp 98.6 F (37 C) (Oral)   Resp 18   SpO2 95%   Results for orders placed or performed during the hospital  encounter of 06/03/19  Lipase, blood  Result Value Ref Range   Lipase 231 (H) 11 - 51 U/L  Comprehensive metabolic panel  Result Value Ref Range   Sodium 138 135 - 145 mmol/L   Potassium 6.5 (HH) 3.5 - 5.1 mmol/L   Chloride 95 (L) 98 - 111 mmol/L   CO2 16 (L) 22 - 32 mmol/L   Glucose, Bld 72 70 - 99 mg/dL   BUN 91 (H) 6 - 20 mg/dL   Creatinine, Ser 15.67 (H) 0.61 - 1.24 mg/dL   Calcium 7.9 (L) 8.9 - 10.3 mg/dL   Total Protein 7.9 6.5 - 8.1 g/dL   Albumin 3.6 3.5 - 5.0 g/dL   AST 13 (L) 15 - 41 U/L   ALT 13 0 - 44 U/L   Alkaline Phosphatase 203 (H) 38 - 126 U/L   Total Bilirubin 1.0 0.3 - 1.2 mg/dL   GFR calc non Af Amer 3 (L) >60 mL/min   GFR calc Af Amer 4 (L) >60 mL/min   Anion gap 27 (H) 5 - 15  CBC  Result Value Ref Range   WBC 4.5 4.0 - 10.5 K/uL   RBC 3.82 (L) 4.22 - 5.81 MIL/uL   Hemoglobin 10.6 (L) 13.0 - 17.0 g/dL   HCT 34.6 (L) 39.0 - 52.0 %   MCV 90.6 80.0 - 100.0 fL   MCH 27.7 26.0 - 34.0 pg   MCHC 30.6 30.0 - 36.0 g/dL   RDW 16.4 (H) 11.5 - 15.5 %   Platelets 161 150 - 400 K/uL   nRBC 0.0 0.0 - 0.2 %   Ct Abdomen Pelvis Wo  Contrast  Result Date: 05/06/2019 CLINICAL DATA:  Increased abdominal distention. End-stage renal disease. Failed kidney transplant. Chronic pancreatitis. EXAM: CT ABDOMEN AND PELVIS WITHOUT CONTRAST TECHNIQUE: Multidetector CT imaging of the abdomen and pelvis was performed following the standard protocol without IV contrast. COMPARISON:  04/20/2019 FINDINGS: Lower chest: No acute findings. Stable right basilar pleural-parenchymal scarring with associated rounded atelectasis. Stable cardiomegaly and small pericardial effusion. Hepatobiliary: No mass visualized on this unenhanced exam. Gallbladder is unremarkable. No evidence of biliary ductal dilatation. Pancreas: No mass or inflammatory process visualized on this unenhanced exam. Spleen:  Within normal limits in size. Adrenals/Urinary tract: Normal adrenal glands. Diffuse atrophy of native  kidneys again seen, consistent with end-stage renal disease. 3 cm high attenuation left renal lesion is stable, consistent with hemorrhagic cyst. Failed renal transplant with calcification is again seen in the left iliac fossa. Stomach/Bowel: No evidence of obstruction, inflammatory process, or abnormal fluid collections. Vascular/Lymphatic: No pathologically enlarged lymph nodes identified. No evidence of abdominal aortic aneurysm. Aortic atherosclerosis. Reproductive:  No mass or other significant abnormality. Other:  None. Musculoskeletal:  No suspicious bone lesions identified. IMPRESSION: No acute findings within the abdomen or pelvis. Stable appearance of failed, partially calcified renal transplant in left iliac fossa. Severe atrophy of native kidneys, consistent with end-stage renal disease. No evidence of hydronephrosis. Aortic Atherosclerosis (ICD10-I70.0). Electronically Signed   By: Marlaine Hind M.D.   On: 05/06/2019 13:05   Dg Abd 1 View  Result Date: 05/18/2019 CLINICAL DATA:  Acute right-sided abdominal pain. EXAM: ABDOMEN - 1 VIEW COMPARISON:  May 06, 2019.  April 19, 2019. FINDINGS: The bowel gas pattern is normal. No radio-opaque calculi or other significant radiographic abnormality are seen. IMPRESSION: No evidence of bowel obstruction or ileus. Aortic Atherosclerosis (ICD10-I70.0). Electronically Signed   By: Marijo Conception M.D.   On: 05/18/2019 13:29   Dg Chest Portable 1 View  Result Date: 06/03/2019 CLINICAL DATA:  Dialysis.  Right upper quadrant pain.  Pancreatitis. EXAM: PORTABLE CHEST 1 VIEW COMPARISON:  05/17/2019. FINDINGS: Surgical clips noted over the chest. Severe cardiomegaly again noted. Bilateral interstitial prominence, right side greater than left again noted. Right pleural effusion, slightly increased from prior exam. Effusion again noted. Findings suggest CHF. No pneumothorax. IMPRESSION: Severe cardiomegaly again noted. Bilateral interstitial prominence,  right side greater than left again noted. Right pleural effusion, slightly increased from prior exam. Findings suggest CHF. Electronically Signed   By: Marcello Moores  Register   On: 06/03/2019 07:34   Dg Chest Port 1 View  Result Date: 05/17/2019 CLINICAL DATA:  Short of breath.  Missed dialysis EXAM: PORTABLE CHEST 1 VIEW COMPARISON:  05/06/2019 FINDINGS: Cardiac enlargement. Progression of vascular congestion and mild edema. Mild to moderate right pleural effusion unchanged with right lower lobe atelectasis. Surgical clips overlying the right hilum unchanged. IMPRESSION: Progression of fluid overload with mild edema. Right pleural effusion approximately unchanged. Electronically Signed   By: Franchot Gallo M.D.   On: 05/17/2019 11:17   Dg Chest Portable 1 View  Result Date: 05/06/2019 CLINICAL DATA:  Abdominal pain and vomiting EXAM: PORTABLE CHEST 1 VIEW COMPARISON:  04/17/2019, CT 04/20/2019 FINDINGS: Enlarged cardiac silhouette. Loculated pleural fluid andatelectasis seen at the RIGHT lung base as described on CT 04/20/2019. Mild central venous pulmonary congestion. No focal consolidation. No pneumothorax. IMPRESSION: 1. No interval change. 2. Marked cardiomegaly. 3. RIGHT pleural effusion with basilar loculation and atelectasis. Electronically Signed   By: Suzy Bouchard M.D.   On: 05/06/2019 09:04   Dg Abd  Acute 2+v W 1v Chest  Result Date: 05/28/2019 CLINICAL DATA:  Pancreatitis EXAM: DG ABDOMEN ACUTE W/ 1V CHEST COMPARISON:  05/18/2019 FINDINGS: Cardiomegaly and vascular pedicle widening. Airspace opacity with pleural fluid at the right base. Normal bowel gas pattern. No evidence of pneumoperitoneum. No concerning mass effect but limited by overlapping artifact. Extensive arterial calcification IMPRESSION: 1. Normal bowel gas pattern.  No evidence of pneumoperitoneum. 2. Chronic pleuroparenchymal opacification at the right base. Electronically Signed   By: Monte Fantasia M.D.   On: 05/28/2019  06:02      [name], PA-C 06/03/19 0858    Shanon Rosser, MD 06/03/19 2233

## 2019-06-03 NOTE — Progress Notes (Addendum)
Patient throwing up and unable to take po medications. Will reassess at a later time.

## 2019-06-03 NOTE — Progress Notes (Signed)
[facility] Progress Note  Subjective: pt back in ED w/ abd pain and n/v.  Lipase 696 yesterady. Admitted for pancreatitis, recurrent, asked to see for esrd. Patient denies new issues aside from abd pain and N/V.   Vitals:   06/03/19 0930 06/03/19 1403 06/03/19 1454 06/03/19 1658  BP: (!) 168/115 (!) 236/134 (!) 169/127 (!) 157/82  Pulse: 62 86 69 64  Resp: _0 Temp:      TempSrc:      SpO2: 98% 90% 93% 94%    Inpatient medications: . amLODipine  5 mg Oral Daily  . apixaban  5 mg Oral BID  . Chlorhexidine Gluconate Cloth  6 each Topical Q0600  .  HYDROmorphone (DILAUDID) injection  0.5 mg Intravenous Once  . linaclotide  144 mcg Oral QAC breakfast  . pantoprazole (PROTONIX) IV  40 mg Intravenous Q12H  . sodium chloride flush  3 mL Intravenous Once  . sodium zirconium cyclosilicate  10 g Oral TID    senna-docusate  Exam General: WNWD NAD  Heart: RRR Lungs: CTAB  Abdomen: soft, diffuse tenderness Extremities: No sig LE edema  Dialysis Access: L Forearm AVF +AVF    Dialysis: [place] [facility]  4h    400/800    72kg     2K/2.25Ca    L AVF    No heparin -Parsabiv 1m IV TIW -Mircera 200 q 2 weeks (last 10/13) -Na thio 25g TIW   CXR - R effusion a little bigger, don't otherwise see much new from prior , mild PVC R side  Assessment/Plan: 1. Abd pain/ pancreatitis - per primary 2. ESRD -HD TTS.  3. Hyperkalemia - 2/2 missed HD. HD tonight or in am. Lokelma in meantime. Low K+ diet.  4. Hypertension/volume - possible early CHF on CXR.  5. Anemia - Hgb 10 here, follow. 6. Metabolic bone disease -Hx calciphylaxis. No Ca/VDRA. Gets Na thio w HD.  Parsabiv not available in hospital. Fosrenol binder when eating. 7. Nutrition -Renal diet when eating.   Rob Jacquelyn Antony 06/03/2019, 5:40 PM  Iron/TIBC/Ferritin/ %Sat    Component Value Date/Time   IRON 34 (L) 04/26/2018 1043   TIBC 154 (L) 04/26/2018 1043   FERRITIN 301 04/26/2018 1043   IRONPCTSAT 22 04/26/2018 1043   Recent Labs  Lab 05/30/19 1139  06/03/19 0525  NA 137   < > 138  K 4.7   < > 6.5*  CL 98   < > 95*  CO2 21*   < > 16*  GLUCOSE 84   < > 72  BUN 77*   < > 91*  CREATININE 15.24*   < > 15.67*  CALCIUM 8.2*   < > 7.9*  PHOS 7.6*  --   --   ALBUMIN 3.1*   < > 3.6   < > = values in this interval not displayed.   Recent Labs  Lab 06/03/19 0525  AST 13*  ALT 13  ALKPHOS 203*  BILITOT 1.0  PROT 7.9   Recent Labs  Lab 06/03/19 0525  WBC 4.5  HGB 10.6*  HCT 34.6*  PLT 161

## 2019-06-03 NOTE — ED Notes (Signed)
Pt called to say he was not getting enough O2, his SpO2 was 64% on the monitor. Applied 2L O2 per pt request.

## 2019-06-03 NOTE — Progress Notes (Signed)
Hypoglycemic Event  CBG: 43  Treatment: 63m dextrose 50% solution   Symptoms: none  Follow-up CBG: Time: 2311 CBG Result:66  Possible Reasons for Event:  No food intake due to pain  Comments/MD notified: Will reassess    [employer]

## 2019-06-03 NOTE — Progress Notes (Signed)
Patient stated having 7/10 right abdominal pain and was feeling very nauseous. Per note, plan was to be cautious on how much pain & antiemetic medication was going to be give due to renal status/QTC. Educated patient about his health status and plan. Patient stated he knows but still needs pain medicine and something to his nausea. Paged [facility], got orders for 51m hydromorphone and iv zofran. Will continue to monitor patient closely.

## 2019-06-03 NOTE — ED Notes (Signed)
Called phlebotomy for blood draw, unable to be here until 0500.

## 2019-06-03 NOTE — ED Notes (Signed)
Pt sleeping.

## 2019-06-03 NOTE — ED Notes (Signed)
Pt refusing Covid swab at this time. Pt reports that he wants to swab himself and is unsure if he can allow someone else to do it. Explained to pt that we have to have a Covid swab in order for him to get admitted and he reports that he will allow this RN to try later.

## 2019-06-03 NOTE — ED Triage Notes (Signed)
Pt reports, "my pancreatitis is flaring up." Reports RUQ pain that radiates to his chest. Started Tuesday. Endorses vomiting.

## 2019-06-03 NOTE — Progress Notes (Signed)
Patient refusing to stand up for weight. Patient alert and oriented x4.

## 2019-06-03 NOTE — ED Notes (Signed)
Pt refusing to take Lokelma. I explained to the patient that his potassium levels are elevated and the importance of taking the medication. Pt continues to refuse. Pt states he is not taking anything else until he gets pain medication at the requested increase dose. Provider notified. Will continue to monitor

## 2019-06-03 NOTE — ED Notes (Signed)
Informed MD potassium is 6.5

## 2019-06-03 NOTE — Progress Notes (Signed)
Hypoglycemic Event  CBG: 66  Treatment: 21m dextrose 50%   Symptoms: none Follow-up CBG: Time: 2330 CBG Result:120  Possible Reasons for Event: No food intake, emesis  Comments/MD notified:    [employer]
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
## 2019-06-03 NOTE — ED Notes (Signed)
Pt refused to have BP cuff on R arm.  IV team at bedside

## 2019-06-03 NOTE — Progress Notes (Signed)
Unable to establish IV after 3 attempts. RN notified.

## 2019-06-03 NOTE — ED Notes (Signed)
Attempted to call report to receiving nurse but was advised that I would be called back as the nurse is currently busy on another call.

## 2019-06-03 NOTE — ED Notes (Signed)
Informed pt that in order to be able to transport him to [facility] he would need to have his covid swab done. Pt continues to refuse being swabbed.

## 2019-06-03 NOTE — ED Notes (Signed)
Date and time results received: 06/03/19 6:02 PM  Test: Potassium Critical Value: 6.7  Name of Provider Notified: Andria Frames  Orders Received? Or Actions Taken?: Awaiting further orders
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
## 2019-06-03 NOTE — ED Notes (Signed)
Pt continues to remove EKG leads. I explained to patient that it is important to keep leads attached for cardiac monitoring, but patient refuses to be reattached.

## 2019-06-03 NOTE — ED Notes (Signed)
IV team unable to gain IV access

## 2019-06-04 LAB — RENAL FUNCTION PANEL
Albumin: 3.4 g/dL — ABNORMAL LOW (ref 3.5–5.0)
Anion gap: 20 — ABNORMAL HIGH (ref 5–15)
BUN: 41 mg/dL — ABNORMAL HIGH (ref 6–20)
CO2: 21 mmol/L — ABNORMAL LOW (ref 22–32)
Calcium: 8.5 mg/dL — ABNORMAL LOW (ref 8.9–10.3)
Chloride: 90 mmol/L — ABNORMAL LOW (ref 98–111)
Creatinine, Ser: 9.11 mg/dL — ABNORMAL HIGH (ref 0.61–1.24)
GFR calc Af Amer: 7 mL/min — ABNORMAL LOW (ref >60)
GFR calc non Af Amer: 6 mL/min — ABNORMAL LOW (ref >60)
Glucose, Bld: 66 mg/dL — ABNORMAL LOW (ref 70–99)
Phosphorus: 7.2 mg/dL — ABNORMAL HIGH (ref 2.5–4.6)
Potassium: 4.6 mmol/L (ref 3.5–5.1)
Sodium: 131 mmol/L — ABNORMAL LOW (ref 135–145)

## 2019-06-04 LAB — GLUCOSE, CAPILLARY
Glucose-Capillary: 63 mg/dL — ABNORMAL LOW (ref 70–99)
Glucose-Capillary: 150 mg/dL — ABNORMAL HIGH (ref 70–99)
Glucose-Capillary: 72 mg/dL (ref 70–99)
Glucose-Capillary: 69 mg/dL — ABNORMAL LOW (ref 70–99)
Glucose-Capillary: 81 mg/dL (ref 70–99)
Glucose-Capillary: 67 mg/dL — ABNORMAL LOW (ref 70–99)
Glucose-Capillary: 99 mg/dL (ref 70–99)

## 2019-06-04 LAB — CBC WITH DIFFERENTIAL/PLATELET
Abs Immature Granulocytes: 0.01 10*3/uL (ref 0.00–0.07)
Basophils Absolute: 0 10*3/uL (ref 0.0–0.1)
Basophils Relative: 0 %
Eosinophils Absolute: 0.1 10*3/uL (ref 0.0–0.5)
Eosinophils Relative: 3 %
HCT: 29.6 % — ABNORMAL LOW (ref 39.0–52.0)
Hemoglobin: 9.6 g/dL — ABNORMAL LOW (ref 13.0–17.0)
Immature Granulocytes: 0 %
Lymphocytes Relative: 15 %
Lymphs Abs: 0.7 10*3/uL (ref 0.7–4.0)
MCH: 28.1 pg (ref 26.0–34.0)
MCHC: 32.4 g/dL (ref 30.0–36.0)
MCV: 86.5 fL (ref 80.0–100.0)
Monocytes Absolute: 0.5 10*3/uL (ref 0.1–1.0)
Monocytes Relative: 11 %
Neutro Abs: 3.2 10*3/uL (ref 1.7–7.7)
Neutrophils Relative %: 71 %
Platelets: 156 10*3/uL (ref 150–400)
RBC: 3.42 MIL/uL — ABNORMAL LOW (ref 4.22–5.81)
RDW: 15.7 % — ABNORMAL HIGH (ref 11.5–15.5)
WBC: 4.5 10*3/uL (ref 4.0–10.5)
nRBC: 0 % (ref 0.0–0.2)

## 2019-06-04 MED ORDER — DEXTROSE 50 % IV SOLN
12.5000 g | Freq: Once | INTRAVENOUS | Status: AC
  Administered 2019-06-04: 21:00:00 12.5 g via INTRAVENOUS
  Filled 2019-06-04: qty 50

## 2019-06-04 MED ORDER — ONDANSETRON HCL 4 MG/2ML IJ SOLN
INTRAMUSCULAR | Status: AC
  Administered 2019-06-04: 4 mg via INTRAVENOUS
  Filled 2019-06-04: qty 2

## 2019-06-04 MED ORDER — CHLORHEXIDINE GLUCONATE CLOTH 2 % EX PADS
6.0000 | MEDICATED_PAD | Freq: Every day | CUTANEOUS | Status: DC
  Administered 2019-06-04 – 2019-06-05 (×2): 6 via TOPICAL

## 2019-06-04 MED ORDER — AMLODIPINE BESYLATE 10 MG PO TABS
10.0000 mg | ORAL_TABLET | Freq: Every day | ORAL | Status: DC
  Administered 2019-06-05 – 2019-06-07 (×3): 10 mg via ORAL
  Filled 2019-06-04 (×5): qty 1

## 2019-06-04 MED ORDER — DEXTROSE 50 % IV SOLN
INTRAVENOUS | Status: AC
  Administered 2019-06-04: 1
  Filled 2019-06-04: qty 50

## 2019-06-04 MED ORDER — HYDROMORPHONE HCL 1 MG/ML IJ SOLN
INTRAMUSCULAR | Status: AC
  Administered 2019-06-04: 1 mg via INTRAVENOUS
  Filled 2019-06-04: qty 1

## 2019-06-04 MED ORDER — PROMETHAZINE HCL 25 MG/ML IJ SOLN: 12.5000 mg | Freq: Once | INTRAMUSCULAR | Status: DC

## 2019-06-04 MED ORDER — SODIUM THIOSULFATE 25 % IV SOLN
25.0000 g | INTRAVENOUS | Status: DC
  Administered 2019-06-04: 25 g via INTRAVENOUS
  Filled 2019-06-04 (×4): qty 100

## 2019-06-04 MED ORDER — HYDROMORPHONE HCL 1 MG/ML IJ SOLN
INTRAMUSCULAR | Status: AC
  Filled 2019-06-04: qty 1

## 2019-06-04 NOTE — Progress Notes (Signed)
[facility] Progress Note  Subjective: pt seen on HD this am, abd pain main c/o.  Up multiple kg's by wts, large UF goal here.   Vitals:   06/04/19 0930 06/04/19 1000 06/04/19 1027 06/04/19 1030  BP: (!) 161/70 (!) 124/99 132/78 (!) (P) 163/77  Pulse: (!) 103 74 (!) 103 (P) 83  Resp:      Temp:      TempSrc:      SpO2:      Weight:      Height:        Inpatient medications: . albuterol  10 mg Nebulization Once  . amLODipine  5 mg Oral Daily  . apixaban  5 mg Oral BID  . Chlorhexidine Gluconate Cloth  6 each Topical Q0600  . linaclotide  144 mcg Oral QAC breakfast  . pantoprazole (PROTONIX) IV  40 mg Intravenous Q12H  . sodium chloride flush  3 mL Intravenous Once  . sodium zirconium cyclosilicate  10 g Oral TID   . sodium thiosulfate infusion for calciphylaxis 25 g (06/04/19 0947)   HYDROmorphone (DILAUDID) injection, ondansetron (ZOFRAN) IV, senna-docusate  Exam General: WNWD NAD  Heart: RRR Lungs: CTAB  Abdomen: soft, diffuse tenderness Extremities: No sig LE edema  Dialysis Access: L Forearm AVF +AVF    [facility]  4h    400/800    72kg     2K/2.25Ca    L AVF    No heparin -Parsabiv 84m IV TIW -Mircera 200 q 2 weeks (last 10/13) -Na thio 25g TIW   CXR - R effusion a little bigger, R > L mild CHF  Assessment/Plan: 1. Abd pain/ pancreatitis - per primary 2. ESRD -HD TTS. Missing HD, B/Cr high, vol overloaded. HD today and again on Sat to get back on sched.  3. Hyperkalemia - 2/2 missed HD. HD ongoing now. Repeat in am.  4. Hypertension/volume - up 8kg mild CHF by CXR, max UF w/ HD 5. Anemia - Hgb 10 here, follow. 6. Metabolic bone disease -Hx calciphylaxis. No Ca/VDRA. Gets Na thio w HD.  Parsabiv not available in hospital. Fosrenol binder when eating. 7. Nutrition -Renal diet when eating.   Rob Eulanda Dorion 06/04/2019, 11:21 AM  Iron/TIBC/Ferritin/ %Sat    Component Value Date/Time   IRON 34 (L) 04/26/2018 1043   TIBC 154 (L) 04/26/2018 1043   FERRITIN 301 04/26/2018 1043   IRONPCTSAT 22 04/26/2018 1043   Recent Labs  Lab 05/30/19 1139  06/03/19 0525 06/03/19 1657  NA 137   < > 138 138  K 4.7   < > 6.5* 6.7*  CL 98   < > 95* 95*  CO2 21*   < > 16* 20*  GLUCOSE 84   < > 72 77  BUN 77*   < > 91* 100*  CREATININE 15.24*   < > 15.67* 17.12*  CALCIUM 8.2*   < > 7.9* 8.2*  PHOS 7.6*  --   --   --   ALBUMIN 3.1*   < > 3.6  --    < > = values in this interval not displayed.   Recent Labs  Lab 06/03/19 0525  AST 13*  ALT 13  ALKPHOS 203*  BILITOT 1.0  PROT 7.9   Recent Labs  Lab 06/04/19 0711  WBC 4.5  HGB 9.6*  HCT 29.6*  PLT 156

## 2019-06-04 NOTE — Progress Notes (Signed)
PROGRESS NOTE    BRAILON [name]  [name]:[id number] DOB: 1965-12-04 DOA: 06/03/2019 PCP: Patient, [name]    Brief Narrative:  Frank [name] is a 53 y.o. male with medical history significant for chronic pancreatitis, HTN, ESRD on TTS HD s/p renal transplant failure to 2/2 rejection, HLD, T2DM, Hx of PE, CHF/NICM, OSA and GERD presenting with continued abdominal pain and nausea/vomiting    Consultants:   nephrology  Procedures: none  Antimicrobials:   none   Subjective: Per nsg he wanted one big time today. Pt reports still with abdominal pain and nausea. No other complaints. Had HD today  Objective: Vitals:   06/04/19 1042 06/04/19 1205 06/04/19 1254 06/04/19 1727  BP: 127/73 (!) 156/77 (!) 158/96 (!) 163/98  Pulse: (!) 110   79  Resp: 19  18   Temp: 97.8 F (36.6 C)  97.6 F (36.4 C)   TempSrc: Oral  Oral   SpO2: 96%     Weight: 76.4 kg     Height:        Intake/Output Summary (Last 24 hours) at 06/04/2019 1920 Last data filed at 06/04/2019 1042 Gross per 24 hour  Intake 240 ml  Output 3162 ml  Net -2922 ml   Filed Weights   06/04/19 0500 06/04/19 0710 06/04/19 1042  Weight: 80.1 kg 80.1 kg 76.4 kg    Examination:  General exam: Appears calm and comfortable  Respiratory system: Clear to auscultation. Respiratory effort normal. Cardiovascular system: S1 & S2 heard, RRR. No JVD, murmurs, rubs, gallops or clicks.  Gastrointestinal system: Abdomen is nondistended, soft and mild upper/mid abd ttp..  Normal bowel sounds heard. Central nervous system: Alert and oriented. No focal neurological deficits. Extremities: no edema Skin warm and dry Psychiatry: Judgement and insight appear normal. Mood & affect appropriate.     Data Reviewed: I have personally reviewed following labs and imaging studies  CBC: Recent Labs  Lab 05/30/19 1139 06/01/19 0450 06/03/19 0525 06/04/19 0711  WBC 3.7* 3.6* 4.5 4.5  NEUTROABS  --  2.0  --  3.2  HGB 9.7* 10.5* 10.6*  9.6*  HCT 30.7* 33.1* 34.6* 29.6*  MCV 88.5 89.5 90.6 86.5  PLT 154 172 161 970   Basic Metabolic Panel: Recent Labs  Lab 05/30/19 1139 06/01/19 0450 06/03/19 0525 06/03/19 1657  NA 137 139 138 138  K 4.7 5.0 6.5* 6.7*  CL 98 95* 95* 95*  CO2 21* 22 16* 20*  GLUCOSE 84 65* 72 77  BUN 77* 68* 91* 100*  CREATININE 15.24* 12.46* 15.67* 17.12*  CALCIUM 8.2* 8.2* 7.9* 8.2*  PHOS 7.6*  --   --   --    GFR: Estimated Creatinine Clearance: 5.3 mL/min (A) (by C-G formula based on SCr of 17.12 mg/dL (H)). Liver Function Tests: Recent Labs  Lab 05/30/19 1139 06/01/19 0450 06/03/19 0525  AST  --  23 13*  ALT  --  12 13  ALKPHOS  --  224* 203*  BILITOT  --  0.7 1.0  PROT  --  7.8 7.9  ALBUMIN 3.1* 3.4* 3.6   Recent Labs  Lab 06/01/19 0450 06/03/19 0525  LIPASE 696* 231*   No results for input(s): AMMONIA in the last 168 hours. Coagulation Profile: No results for input(s): INR, PROTIME in the last 168 hours. Cardiac Enzymes: No results for input(s): CKTOTAL, CKMB, CKMBINDEX, TROPONINI in the last 168 hours. BNP (last 3 results) No results for input(s): PROBNP in the last 8760 hours. HbA1C: No  results for input(s): HGBA1C in the last 72 hours. CBG: Recent Labs  Lab 06/04/19 0505 06/04/19 0552 06/04/19 0922 06/04/19 1218 06/04/19 1654  GLUCAP 63* 150* 72 69* 81   Lipid Profile: No results for input(s): CHOL, HDL, LDLCALC, TRIG, CHOLHDL, LDLDIRECT in the last 72 hours. Thyroid Function Tests: No results for input(s): TSH, T4TOTAL, FREET4, T3FREE, THYROIDAB in the last 72 hours. Anemia Panel: No results for input(s): VITAMINB12, FOLATE, FERRITIN, TIBC, IRON, RETICCTPCT in the last 72 hours. Sepsis Labs: No results for input(s): PROCALCITON, LATICACIDVEN in the last 168 hours.  Recent Results (from the past 240 hour(s))  SARS CORONAVIRUS 2 (TAT 6-24 HRS) Nasopharyngeal Nasopharyngeal Swab     Status: None   Collection Time: 05/28/19  4:58 AM   Specimen:  Nasopharyngeal Swab  Result Value Ref Range Status   SARS Coronavirus 2 NEGATIVE NEGATIVE Final    Comment: (NOTE) SARS-CoV-2 target nucleic acids are NOT DETECTED. The SARS-CoV-2 RNA is generally detectable in upper and lower respiratory specimens during the acute phase of infection. Negative results do not preclude SARS-CoV-2 infection, do not rule out co-infections with other pathogens, and should not be used as the sole basis for treatment or other patient management decisions. Negative results must be combined with clinical observations, patient history, and epidemiological information. The expected result is Negative. Fact Sheet for Patients: SugarRoll.be Fact Sheet for Healthcare Providers: https://www.woods-mathews.com/ This test is not yet approved or cleared by the [place] FDA and  has been authorized for detection and/or diagnosis of SARS-CoV-2 by FDA under an Emergency Use Authorization (EUA). This EUA will remain  in effect (meaning this test can be used) for the duration of the COVID-19 declaration under Section 56 4(b)(1) of the Act, 21 U.S.C. section 360bbb-3(b)(1), unless the authorization is terminated or revoked sooner. Performed at [facility], [place] [address] [place], [place] [address]   SARS CORONAVIRUS 2 (TAT 6-24 HRS) Nasopharyngeal Nasopharyngeal Swab     Status: None   Collection Time: 06/03/19  7:50 AM   Specimen: Nasopharyngeal Swab  Result Value Ref Range Status   SARS Coronavirus 2 NEGATIVE NEGATIVE Final    Comment: (NOTE) SARS-CoV-2 target nucleic acids are NOT DETECTED. The SARS-CoV-2 RNA is generally detectable in upper and lower respiratory specimens during the acute phase of infection. Negative results do not preclude SARS-CoV-2 infection, do not rule out co-infections with other pathogens, and should not be used as the sole basis for treatment or other patient management decisions.  Negative results must be combined with clinical observations, patient history, and epidemiological information. The expected result is Negative. Fact Sheet for Patients: SugarRoll.be Fact Sheet for Healthcare Providers: https://www.woods-mathews.com/ This test is not yet approved or cleared by the [place] FDA and  has been authorized for detection and/or diagnosis of SARS-CoV-2 by FDA under an Emergency Use Authorization (EUA). This EUA will remain  in effect (meaning this test can be used) for the duration of the COVID-19 declaration under Section 56 4(b)(1) of the Act, 21 U.S.C. section 360bbb-3(b)(1), unless the authorization is terminated or revoked sooner. Performed at [facility], [place] [address] [place], [place] [address]          Radiology Studies: Dg Chest Portable 1 View  Result Date: 06/03/2019 CLINICAL DATA:  Dialysis.  Right upper quadrant pain.  Pancreatitis. EXAM: PORTABLE CHEST 1 VIEW COMPARISON:  05/17/2019. FINDINGS: Surgical clips noted over the chest. Severe cardiomegaly again noted. Bilateral interstitial prominence, right side greater than left  again noted. Right pleural effusion, slightly increased from prior exam. Effusion again noted. Findings suggest CHF. No pneumothorax. IMPRESSION: Severe cardiomegaly again noted. Bilateral interstitial prominence, right side greater than left again noted. Right pleural effusion, slightly increased from prior exam. Findings suggest CHF. Electronically Signed   By: [place]   On: 06/03/2019 07:34        Scheduled Meds: . albuterol  10 mg Nebulization Once  . amLODipine  5 mg Oral Daily  . apixaban  5 mg Oral BID  . Chlorhexidine Gluconate Cloth  6 each Topical Q0600  . Chlorhexidine Gluconate Cloth  6 each Topical Q0600  . linaclotide  144 mcg Oral QAC breakfast  . pantoprazole (PROTONIX) IV  40 mg Intravenous Q12H  . sodium chloride flush  3 mL  Intravenous Once  . sodium zirconium cyclosilicate  10 g Oral TID   Continuous Infusions: . sodium thiosulfate infusion for calciphylaxis Stopped (06/04/19 1209)    Assessment & Plan:   Active Problems:   Acute pancreatitis   #Acute on chronic pancreatitis Still with pain.  Vomited today.  Will consult GI in a.m.  #ESRD on TTS HD- Had hemodialysis today.  We will continue as scheduled. Nephrology following.  #Anemia chronic renal disease No signs of bleeding we will continue to monitor  #History of renal transplant failure 2/2 rejection mx per nephrology  #Hypertension Patient on multiple medications in the past and has not picked them up likely worsened in the setting of renal failure.  Currently on amlodipine and hydralazine Will increase amlodipine to 36m daily. Pain likely also increasing his bp  #History of type 2 diabetes Will ck HA1c  Nonischemic cardiomyopathy, chronic systolic heart failure Euvolemic and stable. Compensated #History of venous thromboembolism -Continue apixaban  #GERD -Continue PPI  #hyperkalemia-s/p HD today Monitor labs  DVT prophylaxis: Apixaban Code Status: Full Disposition Plan: possible d/c in 1-2 days       LOS: 1 day   Time spent: 45 minutes with more than 50% on CSparks MD [facility] Pager [phone]  If 7PM-7AM, please contact night-coverage [email or web address] Password [email or web address] 06/04/2019, 7:20 PM

## 2019-06-04 NOTE — Procedures (Signed)
I was present at this dialysis session, have reviewed the session itself and made  appropriate changes Kelly Splinter MD [place] pager [phone]   06/04/2019, 11:25 AM

## 2019-06-04 NOTE — Progress Notes (Addendum)
PT CBG post HD at 53  and was symptomatic c/o nausea and " not feeling right" per pt. ! Ampule D50 given IV .

## 2019-06-04 NOTE — Progress Notes (Signed)
Patient states feeling nauseous and will not take PO medications at the moment. Patient educated, will reassess patient.

## 2019-06-04 NOTE — Progress Notes (Signed)
Lab personnel had attempted to draw blood multiple times on the pt. Had been asked by pt to come back multiple times too. Pt blood draw attempted by phlebotomist  without success as he was asked to stop drawing blood. " It was painful". Had encouraged pt to have blood drawn.

## 2019-06-04 NOTE — Progress Notes (Signed)
Hypoglycemic Event  CBG: 63 Treatment: 27m Dextrose 50%  Symptoms: fatigue Follow-up CBG: Time: 08338CBG Result:150  Possible Reasons for Event: poor PO intake-nausea Comments/MD notified:     [employer]

## 2019-06-05 LAB — BASIC METABOLIC PANEL
Anion gap: 20 — ABNORMAL HIGH (ref 5–15)
BUN: 44 mg/dL — ABNORMAL HIGH (ref 6–20)
CO2: 24 mmol/L (ref 22–32)
Calcium: 8.9 mg/dL (ref 8.9–10.3)
Chloride: 92 mmol/L — ABNORMAL LOW (ref 98–111)
Creatinine, Ser: 10.49 mg/dL — ABNORMAL HIGH (ref 0.61–1.24)
GFR calc Af Amer: 6 mL/min — ABNORMAL LOW (ref >60)
GFR calc non Af Amer: 5 mL/min — ABNORMAL LOW (ref >60)
Glucose, Bld: 61 mg/dL — ABNORMAL LOW (ref 70–99)
Potassium: 4.2 mmol/L (ref 3.5–5.1)
Sodium: 136 mmol/L (ref 135–145)

## 2019-06-05 LAB — GLUCOSE, CAPILLARY
Glucose-Capillary: 57 mg/dL — ABNORMAL LOW (ref 70–99)
Glucose-Capillary: 60 mg/dL — ABNORMAL LOW (ref 70–99)
Glucose-Capillary: 132 mg/dL — ABNORMAL HIGH (ref 70–99)
Glucose-Capillary: 68 mg/dL — ABNORMAL LOW (ref 70–99)
Glucose-Capillary: 63 mg/dL — ABNORMAL LOW (ref 70–99)
Glucose-Capillary: 72 mg/dL (ref 70–99)
Glucose-Capillary: 67 mg/dL — ABNORMAL LOW (ref 70–99)
Glucose-Capillary: 100 mg/dL — ABNORMAL HIGH (ref 70–99)
Glucose-Capillary: 72 mg/dL (ref 70–99)
Glucose-Capillary: 63 mg/dL — ABNORMAL LOW (ref 70–99)
Glucose-Capillary: 113 mg/dL — ABNORMAL HIGH (ref 70–99)

## 2019-06-05 MED ORDER — DEXTROSE 50 % IV SOLN
12.5000 g | Freq: Once | INTRAVENOUS | Status: AC
  Administered 2019-06-05: 05:00:00 12.5 g via INTRAVENOUS
  Filled 2019-06-05: qty 50

## 2019-06-05 MED ORDER — HYDRALAZINE HCL 10 MG PO TABS
10.0000 mg | ORAL_TABLET | Freq: Three times a day (TID) | ORAL | Status: DC
  Administered 2019-06-05 (×2): 10 mg via ORAL
  Filled 2019-06-05: qty 1

## 2019-06-05 MED ORDER — SODIUM CHLORIDE 0.9 % IV SOLN
INTRAVENOUS | Status: DC
  Administered 2019-06-06: 08:00:00 via INTRAVENOUS

## 2019-06-05 MED ORDER — DEXTROSE 50 % IV SOLN
INTRAVENOUS | Status: AC
  Administered 2019-06-05: 19:00:00
  Filled 2019-06-05: qty 50

## 2019-06-05 MED ORDER — HYDROMORPHONE HCL 1 MG/ML IJ SOLN
0.5000 mg | INTRAMUSCULAR | Status: DC | PRN
  Administered 2019-06-05 – 2019-06-07 (×13): 0.5 mg via INTRAVENOUS
  Filled 2019-06-05 (×12): qty 1

## 2019-06-05 MED ORDER — DARBEPOETIN ALFA 200 MCG/0.4ML IJ SOSY
200.0000 ug | PREFILLED_SYRINGE | INTRAMUSCULAR | Status: AC
  Administered 2019-06-06: 06:00:00 200 ug via INTRAVENOUS
  Filled 2019-06-05: qty 0.4

## 2019-06-05 MED ORDER — HYDRALAZINE HCL 25 MG PO TABS
25.0000 mg | ORAL_TABLET | Freq: Three times a day (TID) | ORAL | Status: DC
  Administered 2019-06-05 – 2019-06-06 (×2): 25 mg via ORAL
  Filled 2019-06-05 (×2): qty 1

## 2019-06-05 MED ORDER — DEXTROSE 50 % IV SOLN
INTRAVENOUS | Status: AC
  Administered 2019-06-05: 08:00:00
  Filled 2019-06-05: qty 50

## 2019-06-05 MED ORDER — DEXTROSE 50 % IV SOLN
INTRAVENOUS | Status: AC
  Administered 2019-06-05: 14:00:00
  Filled 2019-06-05: qty 50

## 2019-06-05 NOTE — Progress Notes (Addendum)
Follow up CBG = 132 post D50 1 ampule. Pt had vomited again. Vomitus saved for MD to see. MD updated via AMION .   Pt CBg at 82. Pt has stated and per report pt had been vomiting at least 3 x. Pt c/o some lightheadedness. D50 1 amp given . Will recheck CBG.

## 2019-06-05 NOTE — Progress Notes (Signed)
Subjective:  Cos of some N/V , tolerated  HD yesterday   Objective Vital signs in last 24 hours: Vitals:   06/04/19 2143 06/05/19 0500 06/05/19 0529 06/05/19 0931  BP: (!) 178/88 (!) 180/77  (!) 162/80  Pulse: 81 78    Resp: 20 18    Temp: 98.4 F (36.9 C) 97.9 F (36.6 C)    TempSrc: Oral Oral    SpO2: 100% 96%    Weight:   77 kg   Height:       Weight change: 0.4 kg  Physical Exam  General:Alert ,chronically ill thin AAM NAD  Heart: RRR 1/6 sem , no rub or gallop , Lungs: CTAB  Abdomen: soft, diffuse tenderness, ND n  Extremities: No sig LE edema  Dialysis Access: L Forearm AVF +AVF   Dialysis: [facility]  4h    400/800    72kg     2K/2.25Ca    L AVF    No heparin -Parsabiv 85m IV TIW -Mircera 200 q 2 weeks (last 10/13) -Na thio 25g TIW   CXR - R effusion a little bigger, R > L mild CHF  Problem/Plan:A 1. Abd pain/ pancreatitis - per primary 2. ESRD -HD TTS. Missing HD, B/Cr high, vol overloaded. HD today and again on Sat to get back on sched.  3. Hyperkalemia - 2/2 missed HD. k 4.2  HD ongoing now. Repeat in am.  4. Hypertension/volume - up 8kg mild CHF by CXR,  3,162 cc uf yesterday, today 5kg >edw per wt , max UF w/ HD 5. Anemia - Hgb 10>9.6   esa next hd  6. Metabolic bone disease -Hx calciphylaxis. No Ca/VDRA. Gets Na thio w HD. Parsabiv not available in hospital. Fosrenol binder when eating.Phos 7.2  Ca 8.9  Alb 3.4 7. Nutrition -Renal diet when eating.    DErnest Haber PA-C [facility]per [phone]/14/2020,1:28 PM  LOS: 2 days   Labs: Basic Metabolic Panel: Recent Labs  Lab 05/30/19 1139  06/03/19 1657 06/04/19 2012 06/05/19 0410  NA 137   < > 138 131* 136  K 4.7   < > 6.7* 4.6 4.2  CL 98   < > 95* 90* 92*  CO2 21*   < > 20* 21* 24  GLUCOSE 84   < > 77 66* 61*  BUN 77*   < > 100* 41* 44*  CREATININE 15.24*   < > 17.12* 9.11* 10.49*  CALCIUM 8.2*   < > 8.2* 8.5* 8.9  PHOS 7.6*  --   --  7.2*  --    < >  = values in this interval not displayed.   Liver Function Tests: Recent Labs  Lab 06/01/19 0450 06/03/19 0525 06/04/19 2012  AST 23 13*  --   ALT 12 13  --   ALKPHOS 224* 203*  --   BILITOT 0.7 1.0  --   PROT 7.8 7.9  --   ALBUMIN 3.4* 3.6 3.4*   Recent Labs  Lab 06/01/19 0450 06/03/19 0525  LIPASE 696* 231*   No results for input(s): AMMONIA in the last 168 hours. CBC: Recent Labs  Lab 05/30/19 1139 06/01/19 0450 06/03/19 0525 06/04/19 0711  WBC 3.7* 3.6* 4.5 4.5  NEUTROABS  --  2.0  --  3.2  HGB 9.7* 10.5* 10.6* 9.6*  HCT 30.7* 33.1* 34.6* 29.6*  MCV 88.5 89.5 90.6 86.5  PLT 154 172 161 156   Cardiac Enzymes: No results for input(s): CKTOTAL, CKMB, CKMBINDEX,  TROPONINI in the last 168 hours. CBG: Recent Labs  Lab 06/05/19 0456 06/05/19 0742 06/05/19 0823 06/05/19 1201 06/05/19 1240  GLUCAP 60* 68* 132* 63* 72    Studies/Results: No results found. Medications: . sodium chloride    . sodium thiosulfate infusion for calciphylaxis Stopped (06/04/19 1209)   . albuterol  10 mg Nebulization Once  . amLODipine  10 mg Oral Daily  . apixaban  5 mg Oral BID  . Chlorhexidine Gluconate Cloth  6 each Topical Q0600  . Chlorhexidine Gluconate Cloth  6 each Topical Q0600  . hydrALAZINE  10 mg Oral Q8H  . linaclotide  144 mcg Oral QAC breakfast  . pantoprazole (PROTONIX) IV  40 mg Intravenous Q12H  . promethazine  12.5 mg Intravenous Once  . sodium chloride flush  3 mL Intravenous Once

## 2019-06-05 NOTE — Anesthesia Preprocedure Evaluation (Addendum)
Anesthesia Evaluation  Patient identified by MRN, date of birth, ID band Patient awake    Reviewed: Allergy & Precautions, NPO status , Patient's Chart, lab work & pertinent test results  Airway Mallampati: II  TM Distance: >3 FB Neck ROM: Full    Dental no notable dental hx.    Pulmonary sleep apnea, Continuous Positive Airway Pressure Ventilation and Oxygen sleep apnea , former smoker, PE   Pulmonary exam normal breath sounds clear to auscultation       Cardiovascular hypertension, Pt. on medications +CHF and + DVT  Normal cardiovascular exam Rhythm:Regular Rate:Normal  ECG: NSR, rate 68  ECHO: Left ventricle: Wall thickness was increased in a pattern of mild LVH. Systolic function was moderately reduced. The estimated ejection fraction was in the range of 35% to 40%. Diffuse hypokinesis. Left ventricular diastolic function parameters were normal for the patient&'s age. Left atrium: The atrium was severely dilated. Right ventricle: The cavity size was moderately dilated. Systolic function was mildly to moderately reduced. Right atrium: The atrium was moderately to severely dilated. Tricuspid valve: There was moderate regurgitation. Pulmonary arteries: Systolic pressure was mildly increased. PA peak pressure: 38 mm Hg (S). Pericardium, extracardiac: A small pericardial effusion was identified circumferential to the heart.   Neuro/Psych PSYCHIATRIC DISORDERS Anxiety Depression negative neurological ROS     GI/Hepatic Neg liver ROS, GERD  Medicated and Controlled,  Endo/Other  diabetes  Renal/GU ESRF and DialysisRenal disease     Musculoskeletal negative musculoskeletal ROS (+)   Abdominal   Peds  Hematology  (+) anemia ,   Anesthesia Other Findings Nausea and vomiting  Reproductive/Obstetrics                            Anesthesia Physical Anesthesia Plan  ASA: IV  Anesthesia Plan: MAC    Post-op Pain Management:    Induction: Intravenous  PONV Risk Score and Plan: 1 and Propofol infusion, Treatment may vary due to age or medical condition and Ondansetron  Airway Management Planned: Nasal Cannula  Additional Equipment:   Intra-op Plan:   Post-operative Plan:   Informed Consent: I have reviewed the patients History and Physical, chart, labs and discussed the procedure including the risks, benefits and alternatives for the proposed anesthesia with the patient or authorized representative who has indicated his/her understanding and acceptance.     Dental advisory given  Plan Discussed with: CRNA  Anesthesia Plan Comments:        Anesthesia Quick Evaluation
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
## 2019-06-05 NOTE — Progress Notes (Signed)
PROGRESS NOTE    Frank PUNDT  EVO:[id number] DOB: 1965/11/21 DOA: 06/03/2019 PCP: Patient, [name]    Brief Narrative:  Frank Gift Crowis a 53 y.o.malewith medical history significantfor chronic pancreatitis, HTN, ESRD on TTS HDs/prenal transplant failure to2/2 rejection, HLD, T2DM, Hx of PE, CHF/NICM, OSA and GERD presenting with continued abdominal pain and nausea/vomiting    Consultants:   nephrology  Procedures: none  Antimicrobials:   none    Subjective: Vomited again this a.m. and last night.  Patient still with abdominal pain.  Not tolerating feeding.  Objective: Vitals:   06/05/19 0500 06/05/19 0529 06/05/19 0931 06/05/19 1404  BP: (!) 180/77  (!) 162/80 (!) 188/102  Pulse: 78     Resp: 18     Temp: 97.9 F (36.6 C)     TempSrc: Oral     SpO2: 96%     Weight:  77 kg    Height:       No intake or output data in the 24 hours ending 06/05/19 1413 Filed Weights   06/04/19 0710 06/04/19 1042 06/05/19 0529  Weight: 80.1 kg 76.4 kg 77 kg    Examination: General exam: Appears calm and comfortable , tired. Respiratory system: Clear to auscultation. Respiratory effort normal. Cardiovascular system: S1 & S2 heard, RRR. No JVD, murmurs, rubs, gallops or clicks.  Gastrointestinal system: Abdomen is nondistended, soft and mild upper/mid abd ttp..  Normal bowel sounds heard. Central nervous system: Alert and oriented. No focal neurological deficits. Extremities: no edema Skin warm and dry Psychiatry: Judgement and insight appear normal. Mood & affect appropriate.     Data Reviewed: I have personally reviewed following labs and imaging studies  CBC: Recent Labs  Lab 05/30/19 1139 06/01/19 0450 06/03/19 0525 06/04/19 0711  WBC 3.7* 3.6* 4.5 4.5  NEUTROABS  --  2.0  --  3.2  HGB 9.7* 10.5* 10.6* 9.6*  HCT 30.7* 33.1* 34.6* 29.6*  MCV 88.5 89.5 90.6 86.5  PLT 154 172 161 299   Basic Metabolic Panel: Recent Labs  Lab 05/30/19 1139  06/01/19 0450 06/03/19 0525 06/03/19 1657 06/04/19 2012 06/05/19 0410  NA 137 139 138 138 131* 136  K 4.7 5.0 6.5* 6.7* 4.6 4.2  CL 98 95* 95* 95* 90* 92*  CO2 21* 22 16* 20* 21* 24  GLUCOSE 84 65* 72 77 66* 61*  BUN 77* 68* 91* 100* 41* 44*  CREATININE 15.24* 12.46* 15.67* 17.12* 9.11* 10.49*  CALCIUM 8.2* 8.2* 7.9* 8.2* 8.5* 8.9  PHOS 7.6*  --   --   --  7.2*  --    GFR: Estimated Creatinine Clearance: 8.7 mL/min (A) (by C-G formula based on SCr of 10.49 mg/dL (H)). Liver Function Tests: Recent Labs  Lab 05/30/19 1139 06/01/19 0450 06/03/19 0525 06/04/19 2012  AST  --  23 13*  --   ALT  --  12 13  --   ALKPHOS  --  224* 203*  --   BILITOT  --  0.7 1.0  --   PROT  --  7.8 7.9  --   ALBUMIN 3.1* 3.4* 3.6 3.4*   Recent Labs  Lab 06/01/19 0450 06/03/19 0525  LIPASE 696* 231*   No results for input(s): AMMONIA in the last 168 hours. Coagulation Profile: No results for input(s): INR, PROTIME in the last 168 hours. Cardiac Enzymes: No results for input(s): CKTOTAL, CKMB, CKMBINDEX, TROPONINI in the last 168 hours. BNP (last 3 results) No results for input(s): PROBNP in  the last 8760 hours. HbA1C: No results for input(s): HGBA1C in the last 72 hours. CBG: Recent Labs  Lab 06/05/19 0742 06/05/19 0823 06/05/19 1201 06/05/19 1240 06/05/19 1341  GLUCAP 68* 132* 63* 72 67*   Lipid Profile: No results for input(s): CHOL, HDL, LDLCALC, TRIG, CHOLHDL, LDLDIRECT in the last 72 hours. Thyroid Function Tests: No results for input(s): TSH, T4TOTAL, FREET4, T3FREE, THYROIDAB in the last 72 hours. Anemia Panel: No results for input(s): VITAMINB12, FOLATE, FERRITIN, TIBC, IRON, RETICCTPCT in the last 72 hours. Sepsis Labs: No results for input(s): PROCALCITON, LATICACIDVEN in the last 168 hours.  Recent Results (from the past 240 hour(s))  SARS CORONAVIRUS 2 (TAT 6-24 HRS) Nasopharyngeal Nasopharyngeal Swab     Status: None   Collection Time: 05/28/19  4:58 AM    Specimen: Nasopharyngeal Swab  Result Value Ref Range Status   SARS Coronavirus 2 NEGATIVE NEGATIVE Final    Comment: (NOTE) SARS-CoV-2 target nucleic acids are NOT DETECTED. The SARS-CoV-2 RNA is generally detectable in upper and lower respiratory specimens during the acute phase of infection. Negative results do not preclude SARS-CoV-2 infection, do not rule out co-infections with other pathogens, and should not be used as the sole basis for treatment or other patient management decisions. Negative results must be combined with clinical observations, patient history, and epidemiological information. The expected result is Negative. Fact Sheet for Patients: SugarRoll.be Fact Sheet for Healthcare Providers: https://www.woods-mathews.com/ This test is not yet approved or cleared by the [place] FDA and  has been authorized for detection and/or diagnosis of SARS-CoV-2 by FDA under an Emergency Use Authorization (EUA). This EUA will remain  in effect (meaning this test can be used) for the duration of the COVID-19 declaration under Section 56 4(b)(1) of the Act, 21 U.S.C. section 360bbb-3(b)(1), unless the authorization is terminated or revoked sooner. Performed at [facility], [place] [address] [place], [place] [address]   SARS CORONAVIRUS 2 (TAT 6-24 HRS) Nasopharyngeal Nasopharyngeal Swab     Status: None   Collection Time: 06/03/19  7:50 AM   Specimen: Nasopharyngeal Swab  Result Value Ref Range Status   SARS Coronavirus 2 NEGATIVE NEGATIVE Final    Comment: (NOTE) SARS-CoV-2 target nucleic acids are NOT DETECTED. The SARS-CoV-2 RNA is generally detectable in upper and lower respiratory specimens during the acute phase of infection. Negative results do not preclude SARS-CoV-2 infection, do not rule out co-infections with other pathogens, and should not be used as the sole basis for treatment or other patient management  decisions. Negative results must be combined with clinical observations, patient history, and epidemiological information. The expected result is Negative. Fact Sheet for Patients: SugarRoll.be Fact Sheet for Healthcare Providers: https://www.woods-mathews.com/ This test is not yet approved or cleared by the [place] FDA and  has been authorized for detection and/or diagnosis of SARS-CoV-2 by FDA under an Emergency Use Authorization (EUA). This EUA will remain  in effect (meaning this test can be used) for the duration of the COVID-19 declaration under Section 56 4(b)(1) of the Act, 21 U.S.C. section 360bbb-3(b)(1), unless the authorization is terminated or revoked sooner. Performed at [facility], [place] [address] [place], [place] [address]          Radiology Studies: No results found.      Scheduled Meds: . albuterol  10 mg Nebulization Once  . amLODipine  10 mg Oral Daily  . apixaban  5 mg Oral BID  . Chlorhexidine Gluconate Cloth  6 each  Topical Q0600  . Chlorhexidine Gluconate Cloth  6 each Topical Q0600  . hydrALAZINE  10 mg Oral Q8H  . linaclotide  144 mcg Oral QAC breakfast  . pantoprazole (PROTONIX) IV  40 mg Intravenous Q12H  . promethazine  12.5 mg Intravenous Once  . sodium chloride flush  3 mL Intravenous Once   Continuous Infusions: . sodium chloride    . sodium thiosulfate infusion for calciphylaxis Stopped (06/04/19 1209)    Assessment & Plan:   Active Problems:   Acute pancreatitis   #Acute on chronic pancreatitis Still with pain  And vomiting Gi consulted, I spoke to on call attending and will f/u Keep npo for now    #ESRD on TTS HD- Had hemodialysis yesterday Nephrology following.  #Anemia chronic renal disease No signs of bleeding  continue to monitor  #History of renal transplant failure 2/2 rejection mx per nephrology  #Hypertension Patient on multiple medications in  the past and has not picked them up likely worsened in the setting of renal failure.   Currently on amlodipine and hydralazine Still not well controlled Will increase hydralazine to 28m tid   #History of type 2 diabetes Check  HA1c  Nonischemic cardiomyopathy, chronic systolic heart failure Euvolemic and stable. Compensated   #History of venous thromboembolism -Continue apixaban  #GERD -Continue PPI  #hyperkalemia- K elevated on admission, improved with HD Monitor labs  DVT prophylaxis:Apixaban Code Status:Full Disposition Plan:possible d/c in 1-2 days        LOS: 2 days   Time spent: 45 minutes with more than 50% on COC.    SNolberto Hanlon MD [facility] Pager [phone]  If 7PM-7AM, please contact night-coverage [email or web address] Password [email or web address] 06/05/2019, 2:13 PM
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
## 2019-06-06 ENCOUNTER — Inpatient Hospital Stay (HOSPITAL_COMMUNITY): Payer: Medicare Other | Admitting: Anesthesiology

## 2019-06-06 ENCOUNTER — Encounter (HOSPITAL_COMMUNITY): Payer: Self-pay | Admitting: Anesthesiology

## 2019-06-06 ENCOUNTER — Encounter (HOSPITAL_COMMUNITY)
Admission: EM | Disposition: A | Discharge status: Home / Self Care | Payer: Self-pay | Source: Home / Self Care | Attending: Internal Medicine

## 2019-06-06 HISTORY — PX: ESOPHAGOGASTRODUODENOSCOPY (EGD) WITH PROPOFOL: SHX5813

## 2019-06-06 LAB — RENAL FUNCTION PANEL
Albumin: 3.1 g/dL — ABNORMAL LOW (ref 3.5–5.0)
Anion gap: 23 — ABNORMAL HIGH (ref 5–15)
BUN: 52 mg/dL — ABNORMAL HIGH (ref 6–20)
CO2: 22 mmol/L (ref 22–32)
Calcium: 8.7 mg/dL — ABNORMAL LOW (ref 8.9–10.3)
Chloride: 93 mmol/L — ABNORMAL LOW (ref 98–111)
Creatinine, Ser: 13.65 mg/dL — ABNORMAL HIGH (ref 0.61–1.24)
GFR calc Af Amer: 4 mL/min — ABNORMAL LOW (ref >60)
GFR calc non Af Amer: 4 mL/min — ABNORMAL LOW (ref >60)
Glucose, Bld: 73 mg/dL (ref 70–99)
Phosphorus: 8.7 mg/dL — ABNORMAL HIGH (ref 2.5–4.6)
Potassium: 4.1 mmol/L (ref 3.5–5.1)
Sodium: 138 mmol/L (ref 135–145)

## 2019-06-06 LAB — CBC
HCT: 30.4 % — ABNORMAL LOW (ref 39.0–52.0)
Hemoglobin: 9.6 g/dL — ABNORMAL LOW (ref 13.0–17.0)
MCH: 27.7 pg (ref 26.0–34.0)
MCHC: 31.6 g/dL (ref 30.0–36.0)
MCV: 87.6 fL (ref 80.0–100.0)
Platelets: 144 10*3/uL — ABNORMAL LOW (ref 150–400)
RBC: 3.47 MIL/uL — ABNORMAL LOW (ref 4.22–5.81)
RDW: 15.5 % (ref 11.5–15.5)
WBC: 3.3 10*3/uL — ABNORMAL LOW (ref 4.0–10.5)
nRBC: 0 % (ref 0.0–0.2)

## 2019-06-06 LAB — GLUCOSE, CAPILLARY: Glucose-Capillary: 87 mg/dL (ref 70–99)

## 2019-06-06 LAB — HEMOGLOBIN A1C
Hgb A1c MFr Bld: 5 % (ref 4.8–5.6)
Mean Plasma Glucose: 96.8 mg/dL

## 2019-06-06 SURGERY — ESOPHAGOGASTRODUODENOSCOPY (EGD) WITH PROPOFOL
Anesthesia: Monitor Anesthesia Care

## 2019-06-06 MED ORDER — HYDROMORPHONE HCL 1 MG/ML IJ SOLN
INTRAMUSCULAR | Status: AC
  Administered 2019-06-06: 0.5 mg via INTRAVENOUS
  Filled 2019-06-06: qty 0.5

## 2019-06-06 MED ORDER — HYDROMORPHONE HCL 1 MG/ML IJ SOLN
INTRAMUSCULAR | Status: AC
  Filled 2019-06-06: qty 0.5

## 2019-06-06 MED ORDER — HYDRALAZINE HCL 50 MG PO TABS
50.0000 mg | ORAL_TABLET | Freq: Three times a day (TID) | ORAL | Status: DC
  Administered 2019-06-06 – 2019-06-07 (×3): 50 mg via ORAL
  Filled 2019-06-06 (×3): qty 1

## 2019-06-06 MED ORDER — ONDANSETRON HCL 4 MG/2ML IJ SOLN
INTRAMUSCULAR | Status: AC
  Filled 2019-06-06: qty 2

## 2019-06-06 MED ORDER — ONDANSETRON HCL 4 MG/2ML IJ SOLN
INTRAMUSCULAR | Status: DC | PRN
  Administered 2019-06-06: 4 mg via INTRAVENOUS

## 2019-06-06 MED ORDER — LANTHANUM CARBONATE 500 MG PO CHEW
1000.0000 mg | CHEWABLE_TABLET | Freq: Three times a day (TID) | ORAL | Status: DC
  Administered 2019-06-06 – 2019-06-07 (×3): 1000 mg via ORAL
  Filled 2019-06-06 (×3): qty 2

## 2019-06-06 MED ORDER — PROPOFOL 10 MG/ML IV BOLUS
INTRAVENOUS | Status: DC | PRN
  Administered 2019-06-06: 20 mg via INTRAVENOUS

## 2019-06-06 MED ORDER — DARBEPOETIN ALFA 200 MCG/0.4ML IJ SOSY
PREFILLED_SYRINGE | INTRAMUSCULAR | Status: AC
  Administered 2019-06-06: 200 ug via INTRAVENOUS
  Filled 2019-06-06: qty 0.4

## 2019-06-06 MED ORDER — PROPOFOL 500 MG/50ML IV EMUL
INTRAVENOUS | Status: DC | PRN
  Administered 2019-06-06: 100 ug/kg/min via INTRAVENOUS

## 2019-06-06 SURGICAL SUPPLY — 15 items
BLOCK BITE 60FR ADLT L/F BLUE (MISCELLANEOUS) ×3 IMPLANT
ELECT REM PT RETURN 9FT ADLT (ELECTROSURGICAL)
ELECTRODE REM PT RTRN 9FT ADLT (ELECTROSURGICAL) IMPLANT
FORCEP RJ3 GP 1.8X160 W-NEEDLE (CUTTING FORCEPS) IMPLANT
FORCEPS BIOP RAD 4 LRG CAP 4 (CUTTING FORCEPS) IMPLANT
NDL SCLEROTHERAPY 25GX240 (NEEDLE) IMPLANT
NEEDLE SCLEROTHERAPY 25GX240 (NEEDLE) IMPLANT
PROBE APC STR FIRE (PROBE) IMPLANT
PROBE INJECTION GOLD (MISCELLANEOUS)
PROBE INJECTION GOLD 7FR (MISCELLANEOUS) IMPLANT
SNARE SHORT THROW 13M SML OVAL (MISCELLANEOUS) IMPLANT
SYR 50ML LL SCALE MARK (SYRINGE) IMPLANT
TUBING ENDO SMARTCAP PENTAX (MISCELLANEOUS) ×6 IMPLANT
TUBING IRRIGATION ENDOGATOR (MISCELLANEOUS) ×3 IMPLANT
WATER STERILE IRR 1000ML POUR (IV SOLUTION) IMPLANT

## 2019-06-06 NOTE — Transfer of Care (Signed)
Immediate Anesthesia Transfer of Care Note  Patient: Frank [name]  Procedure(s) Performed: ESOPHAGOGASTRODUODENOSCOPY (EGD) WITH PROPOFOL (N/A )  Patient Location: PACU  Anesthesia Type:MAC  Level of Consciousness: awake, alert  and oriented  Airway & Oxygen Therapy: Patient Spontanous Breathing  Post-op Assessment: Report given to RN and Post -op Vital signs reviewed and stable  Post vital signs: Reviewed and stable  Last Vitals:  Vitals Value Taken Time  BP 169/103 06/06/19 0801  Temp 36.8 C 06/06/19 0801  Pulse 79 06/06/19 0805  Resp 15 06/06/19 0805  SpO2 94 % 06/06/19 0805  Vitals shown include unvalidated device data.  Last Pain:  Vitals:   06/06/19 0801  TempSrc: Temporal  PainSc: 4       Patients Stated Pain Goal: 2 (10/09/20 4825)  Complications: No apparent anesthesia complications

## 2019-06-06 NOTE — Anesthesia Procedure Notes (Signed)
Procedure Name: MAC Date/Time: 06/06/2019 7:51 AM Performed by: Eligha Bridegroom, CRNA Pre-anesthesia Checklist: Patient identified, Emergency Drugs available, Suction available, Patient being monitored and Timeout performed Patient Re-evaluated:Patient Re-evaluated prior to induction Oxygen Delivery Method: Nasal cannula Preoxygenation: Pre-oxygenation with 100% oxygen Induction Type: IV induction
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
## 2019-06-06 NOTE — Progress Notes (Signed)
Patients medications given later in day. Given dilaudid in am and patient refused to take medications at that time. Stated he was afraid he would vomit and didn't want to vomit the meds. Attempted to give later and again refused to take them afraid he would vomit, requested pain medication at that time. Will attempt again to give medications.

## 2019-06-06 NOTE — Anesthesia Postprocedure Evaluation (Signed)
Anesthesia Post Note  Patient: Frank [name]  Procedure(s) Performed: ESOPHAGOGASTRODUODENOSCOPY (EGD) WITH PROPOFOL (N/A )     Patient location during evaluation: PACU Anesthesia Type: MAC Level of consciousness: awake and alert Pain management: pain level controlled Vital Signs Assessment: post-procedure vital signs reviewed and stable Respiratory status: spontaneous breathing, nonlabored ventilation, respiratory function stable and patient connected to nasal cannula oxygen Cardiovascular status: stable and blood pressure returned to baseline Postop Assessment: no apparent nausea or vomiting Anesthetic complications: no    Last Vitals:  Vitals:   06/06/19 0820 06/06/19 1337  BP: (!) 193/80 (!) 188/105  Pulse: 84 89  Resp: 13 16  Temp:  36.6 C  SpO2: 91% 100%    Last Pain:  Vitals:   06/06/19 1337  TempSrc: Oral  PainSc:                  Ryan P Ellender

## 2019-06-06 NOTE — Op Note (Signed)
[facility] Patient Name: Frank [name] Procedure Date : 06/06/2019 MRN: [id number] Attending MD: Carol Ada , MD Date of Birth: Mar 19, 1966 CSN: [id number] Age: 53 Admit Type: Inpatient Procedure:                Upper GI endoscopy Indications:              Epigastric abdominal pain Providers:                Carol Ada, MD, Glori Bickers, RN, Laverda Sorenson,                            Technician, Eligha Bridegroom CRNA, CRNA Referring MD:              Medicines:                Propofol per Anesthesia Complications:            No immediate complications. Estimated Blood Loss:     Estimated blood loss: none. Procedure:                Pre-Anesthesia Assessment:                           - Prior to the procedure, a History and Physical                            was performed, and patient medications and                            allergies were reviewed. The patient's tolerance of                            previous anesthesia was also reviewed. The risks                            and benefits of the procedure and the sedation                            options and risks were discussed with the patient.                            All questions were answered, and informed consent                            was obtained. Prior Anticoagulants: The patient has                            taken no previous anticoagulant or antiplatelet                            agents. ASA Grade Assessment: III - A patient with                            severe systemic disease. After reviewing the risks  and benefits, the patient was deemed in                            satisfactory condition to undergo the procedure.                           - Sedation was administered by an anesthesia                            professional. Deep sedation was attained.                           After obtaining informed consent, the endoscope was                            passed under  direct vision. Throughout the                            procedure, the patient's blood pressure, pulse, and                            oxygen saturations were monitored continuously. The                            GIF-H190 ([id number] Olympus gastroscope was                            introduced through the mouth, and advanced to the                            second part of duodenum. The upper GI endoscopy was                            accomplished without difficulty. The patient                            tolerated the procedure well. Scope In: Scope Out: Findings:      The esophagus was normal.      The stomach was normal.      The examined duodenum was normal. Impression:               - Normal esophagus.                           - Normal stomach.                           - Normal examined duodenum.                           - No specimens collected. Recommendation:           - Patient has a contact number available for                            emergencies. The signs and symptoms of potential  delayed complications were discussed with the                            patient. Return to normal activities tomorrow.                            Written discharge instructions were provided to the                            patient.                           - Resume previous diet.                           - Continue present medications.                           - Symptomatic control.                           - [facility] to assume care in the AM. Procedure Code(s):        --- Professional ---                           [phone], Esophagogastroduodenoscopy, flexible,                            transoral; diagnostic, including collection of                            specimen(s) by brushing or washing, when performed                            (separate procedure) Diagnosis Code(s):        --- Professional ---                           R10.13, Epigastric  pain CPT copyright 2019 [employer]. All rights reserved. The codes documented in this report are preliminary and upon coder review may  be revised to meet current compliance requirements. Carol Ada, MD Carol Ada, MD 06/06/2019 7:58:34 AM This report has been signed electronically. Number of Addenda: 0

## 2019-06-06 NOTE — Progress Notes (Signed)
PROGRESS NOTE    Frank [name]  MMC:[id number] DOB: 07/12/1966 DOA: 06/03/2019 PCP: Patient, [name]    Brief Narrative:  Frank [name] a 53 y.o.malewith medical history significantfor chronic pancreatitis, HTN, ESRD on TTS HDs/prenal transplant failure to2/2 rejection, HLD, T2DM, Hx of PE, CHF/NICM, OSA and GERD presenting with continued abdominal pain and nausea/vomiting    Consultants:  Nephrology, Gi  Procedures: S/p EGD 11/15: NML On HD  Antimicrobials:  none     Subjective: Pt vomited small amount earlier this morning during dialysis.  He is status post EGD early this morning.  He is requesting to eat regular food as he is hungry and his abdominal pain is the same.  Objective: Vitals:   06/06/19 0801 06/06/19 0810 06/06/19 0820 06/06/19 1337  BP: (!) 169/103 (!) 180/75 (!) 193/80 (!) 188/105  Pulse: 81 80 84 89  Resp: _0 Temp: 98.2 F (36.8 C)   97.8 F (36.6 C)  TempSrc: Temporal   Oral  SpO2: 98% 93% 91% 100%  Weight:      Height:        Intake/Output Summary (Last 24 hours) at 06/06/2019 1506 Last data filed at 06/06/2019 0807 Gross per 24 hour  Intake 155 ml  Output 2393 ml  Net -2238 ml   Filed Weights   06/06/19 0300 06/06/19 0645 06/06/19 0720  Weight: 73.8 kg 71.4 kg 71 kg    Examination: General exam:Appears calm and comfortable , tired. Respiratory system: Clear to auscultation. Respiratory effort normal. Cardiovascular system:S1 &S2 heard, RRR. No JVD, murmurs, rubs, gallops or clicks.  Gastrointestinal system:Abdomen is nondistended, soft and less tender than before Central nervous system:Alert and oriented. No focal neurological deficits. Extremities:no edema Skinwarm and dry Psychiatry:Judgement and insight appear normal. Mood &affect appropriate.     Data Reviewed: I have personally reviewed following labs and imaging studies  CBC: Recent Labs  Lab 06/01/19 0450 06/03/19 0525  06/04/19 0711 06/06/19 0419  WBC 3.6* 4.5 4.5 3.3*  NEUTROABS 2.0  --  3.2  --   HGB 10.5* 10.6* 9.6* 9.6*  HCT 33.1* 34.6* 29.6* 30.4*  MCV 89.5 90.6 86.5 87.6  PLT 172 161 156 703*   Basic Metabolic Panel: Recent Labs  Lab 06/03/19 0525 06/03/19 1657 06/04/19 2012 06/05/19 0410 06/06/19 0321  NA 138 138 131* 136 138  K 6.5* 6.7* 4.6 4.2 4.1  CL 95* 95* 90* 92* 93*  CO2 16* 20* 21* 24 22  GLUCOSE 72 77 66* 61* 73  BUN 91* 100* 41* 44* 52*  CREATININE 15.67* 17.12* 9.11* 10.49* 13.65*  CALCIUM 7.9* 8.2* 8.5* 8.9 8.7*  PHOS  --   --  7.2*  --  8.7*   GFR: Estimated Creatinine Clearance: 6.1 mL/min (A) (by C-G formula based on SCr of 13.65 mg/dL (H)). Liver Function Tests: Recent Labs  Lab 06/01/19 0450 06/03/19 0525 06/04/19 2012 06/06/19 0321  AST 23 13*  --   --   ALT 12 13  --   --   ALKPHOS 224* 203*  --   --   BILITOT 0.7 1.0  --   --   PROT 7.8 7.9  --   --   ALBUMIN 3.4* 3.6 3.4* 3.1*   Recent Labs  Lab 06/01/19 0450 06/03/19 0525  LIPASE 696* 231*   No results for input(s): AMMONIA in the last 168 hours. Coagulation Profile: No results for input(s): INR, PROTIME in the last 168 hours. Cardiac Enzymes: No  results for input(s): CKTOTAL, CKMB, CKMBINDEX, TROPONINI in the last 168 hours. BNP (last 3 results) No results for input(s): PROBNP in the last 8760 hours. HbA1C: Recent Labs    06/06/19 0321  HGBA1C 5.0   CBG: Recent Labs  Lab 06/05/19 1341 06/05/19 1521 06/05/19 1639 06/05/19 1828 06/05/19 2013  GLUCAP 67* 100* 72 63* 113*   Lipid Profile: No results for input(s): CHOL, HDL, LDLCALC, TRIG, CHOLHDL, LDLDIRECT in the last 72 hours. Thyroid Function Tests: No results for input(s): TSH, T4TOTAL, FREET4, T3FREE, THYROIDAB in the last 72 hours. Anemia Panel: No results for input(s): VITAMINB12, FOLATE, FERRITIN, TIBC, IRON, RETICCTPCT in the last 72 hours. Sepsis Labs: No results for input(s): PROCALCITON, LATICACIDVEN in the last  168 hours.  Recent Results (from the past 240 hour(s))  SARS CORONAVIRUS 2 (TAT 6-24 HRS) Nasopharyngeal Nasopharyngeal Swab     Status: None   Collection Time: 05/28/19  4:58 AM   Specimen: Nasopharyngeal Swab  Result Value Ref Range Status   SARS Coronavirus 2 NEGATIVE NEGATIVE Final    Comment: (NOTE) SARS-CoV-2 target nucleic acids are NOT DETECTED. The SARS-CoV-2 RNA is generally detectable in upper and lower respiratory specimens during the acute phase of infection. Negative results do not preclude SARS-CoV-2 infection, do not rule out co-infections with other pathogens, and should not be used as the sole basis for treatment or other patient management decisions. Negative results must be combined with clinical observations, patient history, and epidemiological information. The expected result is Negative. Fact Sheet for Patients: SugarRoll.be Fact Sheet for Healthcare Providers: https://www.woods-mathews.com/ This test is not yet approved or cleared by the [place] FDA and  has been authorized for detection and/or diagnosis of SARS-CoV-2 by FDA under an Emergency Use Authorization (EUA). This EUA will remain  in effect (meaning this test can be used) for the duration of the COVID-19 declaration under Section 56 4(b)(1) of the Act, 21 U.S.C. section 360bbb-3(b)(1), unless the authorization is terminated or revoked sooner. Performed at [facility], [place] [address], [place], [place] [address]   SARS CORONAVIRUS 2 (TAT 6-24 HRS) Nasopharyngeal Nasopharyngeal Swab     Status: None   Collection Time: 06/03/19  7:50 AM   Specimen: Nasopharyngeal Swab  Result Value Ref Range Status   SARS Coronavirus 2 NEGATIVE NEGATIVE Final    Comment: (NOTE) SARS-CoV-2 target nucleic acids are NOT DETECTED. The SARS-CoV-2 RNA is generally detectable in upper and lower respiratory specimens during the acute phase of infection. Negative  results do not preclude SARS-CoV-2 infection, do not rule out co-infections with other pathogens, and should not be used as the sole basis for treatment or other patient management decisions. Negative results must be combined with clinical observations, patient history, and epidemiological information. The expected result is Negative. Fact Sheet for Patients: SugarRoll.be Fact Sheet for Healthcare Providers: https://www.woods-mathews.com/ This test is not yet approved or cleared by the [place] FDA and  has been authorized for detection and/or diagnosis of SARS-CoV-2 by FDA under an Emergency Use Authorization (EUA). This EUA will remain  in effect (meaning this test can be used) for the duration of the COVID-19 declaration under Section 56 4(b)(1) of the Act, 21 U.S.C. section 360bbb-3(b)(1), unless the authorization is terminated or revoked sooner. Performed at [facility], [place] [address], [place], [place] [address]          Radiology Studies: No results found.      Scheduled Meds: . albuterol  10 mg Nebulization Once  .  amLODipine  10 mg Oral Daily  . apixaban  5 mg Oral BID  . Chlorhexidine Gluconate Cloth  6 each Topical Q0600  . Chlorhexidine Gluconate Cloth  6 each Topical Q0600  . hydrALAZINE  50 mg Oral Q8H  . lanthanum  1,000 mg Oral TID WC  . linaclotide  144 mcg Oral QAC breakfast  . pantoprazole (PROTONIX) IV  40 mg Intravenous Q12H  . promethazine  12.5 mg Intravenous Once  . sodium chloride flush  3 mL Intravenous Once   Continuous Infusions: . sodium thiosulfate infusion for calciphylaxis Stopped (06/04/19 1209)    Assessment & Plan:   Active Problems:   Acute pancreatitis   #Acute on chronic pancreatitis S/p EGD- NML Lipase trending down Spoke to GI after EGD about patient's vomiting the etiology was unclear.  Recommended checking serum toxicology however patient previously had denied  using marijuana.  Will check serum toxicology. He is requesting food will upgrade his diet to renal diet His pain/vomiting might be combo of missing HD and chronic pancreatitis   #ESRD on TTS HD- Had hemodialysis today Nephrology following.  #Anemia chronic renal disease No signs of bleeding , stabilized continue to monitor   #History of renal transplant failure 2/2 rejection mx per nephrology   #Hypertension Patient on multiple medications in the past and has not picked them up likely worsened in the setting of renal failure.  Currently on amlodipine and hydralazine Still uncontrolled, will increase hydralazine to 50 mg TID    #History of type 2 diabetes  HA1c pending  Nonischemic cardiomyopathy, chronic systolic heart failure Euvolemic and stable. Compensated   #History of venous thromboembolism -Continue apixaban  #GERD -Continue PPI  #hyperkalemia- K elevated on admission, improved with HD Stabilized.  DVT prophylaxis:Apixaban Code Status:Full Disposition Plan:Was going to DC patient today but he feels like he is not ready.  I will keep him overnight if he tolerates his food and since there is no further work-up will DC him in a.m.  Did discuss being compliant with his hemodialysis although he reports he did not miss any dialysis per nephrology PA he has in the past         LOS: 3 days   Time spent: 45 minutes with more than 50% on Rosine, MD [facility] Pager [phone]  If 7PM-7AM, please contact night-coverage [email or web address] Password [email or web address] 06/06/2019, 3:06 PM

## 2019-06-06 NOTE — Progress Notes (Signed)
Patient refused blood draw for drug screen repeatedly. Stated phlebotomy could draw it later. Phlebotomy will attempt again later.

## 2019-06-06 NOTE — Interval H&P Note (Signed)
History and Physical Interval Note:  06/06/2019 7:39 AM  Frank [name]  has presented today for surgery, with the diagnosis of Nausea and vomiting.  The various methods of treatment have been discussed with the patient and family. After consideration of risks, benefits and other options for treatment, the patient has consented to  Procedure(s): ESOPHAGOGASTRODUODENOSCOPY (EGD) WITH PROPOFOL (N/A) as a surgical intervention.  The patient's history has been reviewed, patient examined, no change in status, stable for surgery.  I have reviewed the patient's chart and labs.  Questions were answered to the patient's satisfaction.     Chalmers Iddings D

## 2019-06-06 NOTE — Progress Notes (Signed)
Subjective:  HD late This am =2am  Reports vomiting last pm, but just ate sandwich and ordering Breakfasts on Phone  / Noted back from EGD also this am  with no sig findings  Objective Vital signs in last 24 hours: Vitals:   06/06/19 0720 06/06/19 0801 06/06/19 0810 06/06/19 0820  BP: (!) 173/66 (!) 169/103 (!) 180/75 (!) 193/80  Pulse: 84 81 80 84  Resp: _0 Temp: 98.2 F (36.8 C) 98.2 F (36.8 C)    TempSrc: Oral Temporal    SpO2: 95% 98% 93% 91%  Weight: 71 kg     Height:       Weight change: -6.3 kg  Physical Exam  General:Alert ,chronically ill thin AAM NAD  Heart: RRR 1/6 sem , no rub or gallop , Lungs: CTAB  Abdomen: BS pos.  soft, diffuse tenderness, Non distended   Extremities: No sig LE edema  Dialysis Access: L Forearm AVF + bruit    [facility] 400/800 72kg 2K/2.25Ca L AVF No heparin -Parsabiv 38m IV TIW -Mircera 200 q 2 weeks (last 10/13) -Na thio 25g TIW  CXR - R effusion a little bigger, R > L mild CHF  Problem/Plan:A 1. Abd pain/ pancreatitis - per primary/This am GI  EGD = normal espohagus , stomach,and normal  examined duodenum  2. ESRD -HD TTS.Missing HD, B/Cr high, vol overloaded. HD today ealy am  and now  back on sched./ Next HD would be Tues  and as op if tolerating diet today and dc 3. Hyperkalemia - 2/2 missed HD. k 4.1  4. Hypertension/volume -up 8kg mild CHF by CXR,  3,162 cc uf 11/13 , 2.4 l uf 11/15 early am and wt 71 kg now  Below edw   5. Anemia - Hgb 10>9.6  200 aranesp  11/15   6. Metabolic bone disease -Hx calciphylaxis. No Ca/VDRA. Gets Na thio w HD. Parsabiv not available in hospital. Fosrenol binder when eating.Phos 9.7   Ca 8.9  Alb 3.1 7. Nutrition -Renal diet when eating.   DErnest Haber PA-C [facility] [phone]/15/2020,8:49 AM  LOS: 3 days   Labs: Basic Metabolic Panel: Recent Labs  Lab 05/30/19 1139  06/04/19 2012 06/05/19 0410  06/06/19 0321  NA 137   < > 131* 136 138  K 4.7   < > 4.6 4.2 4.1  CL 98   < > 90* 92* 93*  CO2 21*   < > 21* 24 22  GLUCOSE 84   < > 66* 61* 73  BUN 77*   < > 41* 44* 52*  CREATININE 15.24*   < > 9.11* 10.49* 13.65*  CALCIUM 8.2*   < > 8.5* 8.9 8.7*  PHOS 7.6*  --  7.2*  --  8.7*   < > = values in this interval not displayed.   Liver Function Tests: Recent Labs  Lab 06/01/19 0450 06/03/19 0525 06/04/19 2012 06/06/19 0321  AST 23 13*  --   --   ALT 12 13  --   --   ALKPHOS 224* 203*  --   --   BILITOT 0.7 1.0  --   --   PROT 7.8 7.9  --   --   ALBUMIN 3.4* 3.6 3.4* 3.1*   Recent Labs  Lab 06/01/19 0450 06/03/19 0525  LIPASE 696* 231*   No results for input(s): AMMONIA in the last 168 hours. CBC: Recent Labs  Lab 05/30/19 1139 06/01/19 0450 06/03/19  3354 06/04/19 0711 06/06/19 0419  WBC 3.7* 3.6* 4.5 4.5 3.3*  NEUTROABS  --  2.0  --  3.2  --   HGB 9.7* 10.5* 10.6* 9.6* 9.6*  HCT 30.7* 33.1* 34.6* 29.6* 30.4*  MCV 88.5 89.5 90.6 86.5 87.6  PLT 154 172 161 156 144*   Cardiac Enzymes: No results for input(s): CKTOTAL, CKMB, CKMBINDEX, TROPONINI in the last 168 hours. CBG: Recent Labs  Lab 06/05/19 1341 06/05/19 1521 06/05/19 1639 06/05/19 1828 06/05/19 2013  GLUCAP 67* 100* 72 63* 113*    Studies/Results: No results found. Medications: . sodium thiosulfate infusion for calciphylaxis Stopped (06/04/19 1209)   . albuterol  10 mg Nebulization Once  . amLODipine  10 mg Oral Daily  . apixaban  5 mg Oral BID  . Chlorhexidine Gluconate Cloth  6 each Topical Q0600  . Chlorhexidine Gluconate Cloth  6 each Topical Q0600  . hydrALAZINE  25 mg Oral Q8H  . linaclotide  144 mcg Oral QAC breakfast  . pantoprazole (PROTONIX) IV  40 mg Intravenous Q12H  . promethazine  12.5 mg Intravenous Once  . sodium chloride flush  3 mL Intravenous Once

## 2019-06-06 NOTE — Progress Notes (Signed)
Unable to obtain a urine sample for drug screen as patient stated he does not urinate. Dialysis patient for many years.

## 2019-06-07 ENCOUNTER — Encounter (HOSPITAL_COMMUNITY): Payer: Self-pay | Admitting: Gastroenterology

## 2019-06-07 DIAGNOSIS — K861 Other chronic pancreatitis: Secondary | ICD-10-CM

## 2019-06-07 DIAGNOSIS — Z9115 Patient's noncompliance with renal dialysis: Secondary | ICD-10-CM

## 2019-06-07 DIAGNOSIS — K859 Acute pancreatitis without necrosis or infection, unspecified: Principal | ICD-10-CM

## 2019-06-07 DIAGNOSIS — E875 Hyperkalemia: Secondary | ICD-10-CM

## 2019-06-07 LAB — GLUCOSE, CAPILLARY: Glucose-Capillary: 85 mg/dL (ref 70–99)

## 2019-06-07 MED ORDER — LANTHANUM CARBONATE 1000 MG PO CHEW: 1000.0000 mg | CHEWABLE_TABLET | Freq: Three times a day (TID) | ORAL | 0 refills | Status: AC

## 2019-06-07 MED ORDER — ONDANSETRON HCL 4 MG PO TABS: 4.0000 mg | ORAL_TABLET | Freq: Every day | ORAL | 1 refills | Status: DC | PRN

## 2019-06-07 MED ORDER — ONDANSETRON HCL 4 MG PO TABS: 4.0000 mg | ORAL_TABLET | Freq: Three times a day (TID) | ORAL | 0 refills | Status: DC | PRN

## 2019-06-07 MED ORDER — PANTOPRAZOLE SODIUM 40 MG PO TBEC: 40.0000 mg | DELAYED_RELEASE_TABLET | Freq: Every day | ORAL | 1 refills | Status: DC

## 2019-06-07 MED ORDER — AMLODIPINE BESYLATE 10 MG PO TABS: 10.0000 mg | ORAL_TABLET | Freq: Every day | ORAL | 0 refills | Status: DC

## 2019-06-07 MED ORDER — PANTOPRAZOLE SODIUM 40 MG IV SOLR: 40.0000 mg | Freq: Once | INTRAVENOUS | 0 refills | Status: DC

## 2019-06-07 MED FILL — PANTOPRAZOLE SOD DR 40 MG T: 40 | 30 days supply | Qty: 30 | Fill #0

## 2019-06-07 MED FILL — AMLODIPINE BESYLATE 10 MG T: 10 | 30 days supply | Qty: 30 | Fill #0

## 2019-06-07 MED FILL — ONDANSETRON HCL 4 MG TABLET: 4 | 3 days supply | Qty: 6 | Fill #0

## 2019-06-07 NOTE — Progress Notes (Signed)
Hinton Dyer RN stated patient was upset because he was not getting his PRN iv pain medication when he requested it. Patient requested to talk to me since I am Dana's preceptor for the night. As soon as I walked in the room to try to talk to talk to him about the situation, patient start rasing his voice and stating we were doing a poor job. He stated that he had told Hinton Dyer 2 hrs before that he would need medicine and she did not bring it at the correct time. I tried to explain that the iv pan medication was not scheduled and it was for when needed. Patient not listening and still raising his voice. Told patient charge nurse was coming to talk to him as well. Notified security

## 2019-06-07 NOTE — Progress Notes (Signed)
[facility] Progress Note   Subjective:   Patient seen and examined at bedside.  Reports he is feeling better this AM.  Able to tolerate part of his dinner last night.  Plans to d/c home today.   Objective Vitals:   06/06/19 2125 06/07/19 0602 06/07/19 0602 06/07/19 0815  BP: (!) 178/91 (!) 195/102 (!) 195/102   Pulse: 78  77   Resp:      Temp: 97.7 F (36.5 C)  98.1 F (36.7 C)   TempSrc: Oral  Oral   SpO2: 97%  99% 99%  Weight:      Height:       Physical Exam General:NAD, well appearing male, laying in bed Heart:RRR Lungs:CTAB Extremities:no LE edema Dialysis Access: LU AVF    Filed Weights   06/06/19 0300 06/06/19 0645 06/06/19 0720  Weight: 73.8 kg 71.4 kg 71 kg    Intake/Output Summary (Last 24 hours) at 06/07/2019 0832 Last data filed at 06/07/2019 1478 Gross per 24 hour  Intake 657 ml  Output -  Net 657 ml    Additional Objective Labs: Basic Metabolic Panel: Recent Labs  Lab 06/04/19 2012 06/05/19 0410 06/06/19 0321  NA 131* 136 138  K 4.6 4.2 4.1  CL 90* 92* 93*  CO2 21* 24 22  GLUCOSE 66* 61* 73  BUN 41* 44* 52*  CREATININE 9.11* 10.49* 13.65*  CALCIUM 8.5* 8.9 8.7*  PHOS 7.2*  --  8.7*   Liver Function Tests: Recent Labs  Lab 06/01/19 0450 06/03/19 0525 06/04/19 2012 06/06/19 0321  AST 23 13*  --   --   ALT 12 13  --   --   ALKPHOS 224* 203*  --   --   BILITOT 0.7 1.0  --   --   PROT 7.8 7.9  --   --   ALBUMIN 3.4* 3.6 3.4* 3.1*   Recent Labs  Lab 06/01/19 0450 06/03/19 0525  LIPASE 696* 231*   CBC: Recent Labs  Lab 06/01/19 0450 06/03/19 0525 06/04/19 0711 06/06/19 0419  WBC 3.6* 4.5 4.5 3.3*  NEUTROABS 2.0  --  3.2  --   HGB 10.5* 10.6* 9.6* 9.6*  HCT 33.1* 34.6* 29.6* 30.4*  MCV 89.5 90.6 86.5 87.6  PLT 172 161 156 144*   Blood Culture CBG: Recent Labs  Lab 06/05/19 1521 06/05/19 1639 06/05/19 1828 06/05/19 2013 06/06/19 1607  GLUCAP 100* 72 63* 113* 87   Iron Studies: No results for  input(s): IRON, TIBC, TRANSFERRIN, FERRITIN in the last 72 hours. Lab Results  Component Value Date   INR 1.39 07/28/2018   INR 1.36 04/26/2018   INR 1.2 (A) 01/15/2018   Studies/Results: No results found.  Medications: . sodium thiosulfate infusion for calciphylaxis Stopped (06/04/19 1209)   . albuterol  10 mg Nebulization Once  . amLODipine  10 mg Oral Daily  . apixaban  5 mg Oral BID  . Chlorhexidine Gluconate Cloth  6 each Topical Q0600  . Chlorhexidine Gluconate Cloth  6 each Topical Q0600  . hydrALAZINE  50 mg Oral Q8H  . lanthanum  1,000 mg Oral TID WC  . linaclotide  144 mcg Oral QAC breakfast  . pantoprazole (PROTONIX) IV  40 mg Intravenous Q12H  . promethazine  12.5 mg Intravenous Once  . sodium chloride flush  3 mL Intravenous Once    Dialysis Orders: [facility] 4h 400/800 72kg 2K/2.25Ca L AVF No heparin -Parsabiv 49m IV TIW -Mircera 200 q 2 weeks (last 10/13) -  Na thio 25g TIW  CXR - R effusion a little bigger, R >L mild CHF  Assessment/Plan: 1. Abd pain/ pancreatitis - EGD 11/15 w/ normal espohagus , stomach,and normal  examined duodenum. Per primary 2. ESRD -HD TTS.Missing HD, BUN/Cr high, vol overloaded. Next HD tomorrow as OP. 3. Hyperkalemia - resolved. 2/2 missed HD.last K 4.1 4. Hypertension/volume - BP^, now under EDW, lower.   5. Anemia - Hgb 10>9.6 200 aranesp  11/15  6. Metabolic bone disease -Hx calciphylaxis. No Ca/VDRA. Gets Na thio w HD. Parsabiv not available in hospital. Fosrenol binder when eating.Phos 8.7, Ca 8.7 7. Nutrition -Renal diet when eating, Alb 3.1 8. Dispo - ok to d/c from renal standpoint  Jen Mow, PA-C [facility] Pager: [phone] 06/07/2019,8:32 AM  LOS: 4 days

## 2019-06-07 NOTE — Discharge Summary (Signed)
Frank [name] DJT:[id number] DOB: 1965-08-15 DOA: 06/03/2019  PCP: Patient, [name]  Admit date: 06/03/2019 Discharge date: 06/07/2019  Admitted From: Home Disposition: Home  Recommendations for Outpatient Follow-up:  1. Follow up with PCP in 1 week 2. Please obtain BMP/CBC in one week 3. Please follow up on the following pending results:  Home Health: No   Discharge Condition:Stable CODE STATUS: Full Diet recommendation: Renal diet  Brief/Interim Summary: Frank [name]  is a 53 y.o. male, with past medical history significant for end-stage renal disease on hemodialysis, chronic pancreatitis and chronic pain on pain control presenting today with 5 days history of epigastric pain associated with nausea and vomiting.  Patient had multiple episodes of vomiting during the hospitalization.  Gastroenterology was consulted.  He had a EGD which was negative.  Nephrology was also consulted for his dialysis.  On day of presentation his potassium level was 6.7.  He did have hemodialysis with improvement of his potassium.[name]  Abdominal x-ray that was negative for any acute abdominal abnormalities.  Serum toxicology was ordered but was never done.  His blood pressure medications were adjusted to improve his blood pressure.  Once he tolerated solids this a.m. he reports he feels well and would like to go home.  Discharge Diagnoses:  Active Problems:   Acute pancreatitis    Discharge Instructions  Discharge Instructions    Call MD for:  temperature >100.4   Complete by: As directed    Diet - low sodium heart healthy   Complete by: As directed    Discharge instructions   Complete by: As directed    F/u with hemodialysis as scheduled F/u with nephrology in one week F/u with pcp in 1 week   Increase activity slowly   Complete by: As directed      Allergies as of 06/07/2019      Reactions   Butalbital-apap-caffeine Shortness Of Breath, Swelling, Other (See Comments)   Swelling in throat   Ferrlecit [na Ferric Gluc Cplx In Sucrose] Shortness Of Breath, Swelling, Other (See Comments)   Swelling in throat, tolerates Venofor   Minoxidil Shortness Of Breath   Tylenol [acetaminophen] Anaphylaxis, Swelling   Darvocet [propoxyphene N-acetaminophen] Hives      Medication List    STOP taking these medications   lisinopril 5 MG tablet Commonly known as: ZESTRIL   omeprazole 20 MG capsule Commonly known as: PRILOSEC   promethazine 25 MG suppository Commonly known as: PHENERGAN     TAKE these medications   amLODipine 10 MG tablet Commonly known as: NORVASC Take 1 tablet (10 mg total) by mouth daily. What changed:   medication strength  how much to take   apixaban 5 MG Tabs tablet Commonly known as: Eliquis Take 1 tablet (5 mg total) by mouth 2 (two) times daily.   cyclobenzaprine 10 MG tablet Commonly known as: FLEXERIL Take 1 tablet (10 mg total) by mouth 3 (three) times daily as needed for muscle spasms.   diphenhydrAMINE 25 mg capsule Commonly known as: BENADRYL Take 25 mg by mouth every 8 (eight) hours as needed for itching.   hydrALAZINE 100 MG tablet Commonly known as: APRESOLINE Take 1 tablet (100 mg total) by mouth 3 (three) times daily.   lanthanum 1000 MG chewable tablet Commonly known as: FOSRENOL Chew 1 tablet (1,000 mg total) by mouth 3 (three) times daily with meals.   Linzess 72 MCG capsule Generic drug: linaclotide Take 144 mcg by mouth daily before breakfast. Rx  Sent for Prior  Authorization request to Dr. Ricard Dillon, would only pay for 1 daily.   Nephro Vitamins 0.8 MG Tabs Take 1 tablet by mouth daily.   nitroGLYCERIN 0.4 MG SL tablet Commonly known as: NITROSTAT Place 1 tablet (0.4 mg total) under the tongue every 5 (five) minutes as needed for chest pain.   ondansetron 4 MG tablet Commonly known as: ZOFRAN Take 1 tablet (4 mg total) by mouth every 8 (eight) hours as needed for up to 20 doses for nausea or vomiting.   oxyCODONE 15 MG  immediate release tablet Commonly known as: ROXICODONE Take 15 mg by mouth every 4 (four) hours as needed for pain.   Oxycodone HCl 10 MG Tabs Commonly known as: Roxicodone Take 1 tablet (10 mg total) by mouth every 12 (twelve) hours as needed for up to 6 doses for breakthrough pain.   pantoprazole 40 MG tablet Commonly known as: Protonix Take 1 tablet (40 mg total) by mouth daily.   senna-docusate 8.6-50 MG tablet Commonly known as: Senokot-S Take 2 tablets by mouth at bedtime.       Allergies  Allergen Reactions  . Butalbital-Apap-Caffeine Shortness Of Breath, Swelling and Other (See Comments)    Swelling in throat  . Ferrlecit [Na Ferric Gluc Cplx In Sucrose] Shortness Of Breath, Swelling and Other (See Comments)    Swelling in throat, tolerates Venofor  . Minoxidil Shortness Of Breath  . Tylenol [Acetaminophen] Anaphylaxis and Swelling  . Darvocet [Propoxyphene N-Acetaminophen] Hives    Consultations:  GI and nephrology   Procedures/Studies: Dg Abd 1 View  Result Date: 05/18/2019 CLINICAL DATA:  Acute right-sided abdominal pain. EXAM: ABDOMEN - 1 VIEW COMPARISON:  May 06, 2019.  April 19, 2019. FINDINGS: The bowel gas pattern is normal. No radio-opaque calculi or other significant radiographic abnormality are seen. IMPRESSION: No evidence of bowel obstruction or ileus. Aortic Atherosclerosis (ICD10-I70.0). Electronically Signed   By: Marijo Conception M.D.   On: 05/18/2019 13:29   Dg Chest Portable 1 View  Result Date: 06/03/2019 CLINICAL DATA:  Dialysis.  Right upper quadrant pain.  Pancreatitis. EXAM: PORTABLE CHEST 1 VIEW COMPARISON:  05/17/2019. FINDINGS: Surgical clips noted over the chest. Severe cardiomegaly again noted. Bilateral interstitial prominence, right side greater than left again noted. Right pleural effusion, slightly increased from prior exam. Effusion again noted. Findings suggest CHF. No pneumothorax. IMPRESSION: Severe cardiomegaly again  noted. Bilateral interstitial prominence, right side greater than left again noted. Right pleural effusion, slightly increased from prior exam. Findings suggest CHF. Electronically Signed   By: Marcello Moores  Register   On: 06/03/2019 07:34   Dg Chest Port 1 View  Result Date: 05/17/2019 CLINICAL DATA:  Short of breath.  Missed dialysis EXAM: PORTABLE CHEST 1 VIEW COMPARISON:  05/06/2019 FINDINGS: Cardiac enlargement. Progression of vascular congestion and mild edema. Mild to moderate right pleural effusion unchanged with right lower lobe atelectasis. Surgical clips overlying the right hilum unchanged. IMPRESSION: Progression of fluid overload with mild edema. Right pleural effusion approximately unchanged. Electronically Signed   By: Franchot Gallo M.D.   On: 05/17/2019 11:17   Dg Abd Acute 2+v W 1v Chest  Result Date: 05/28/2019 CLINICAL DATA:  Pancreatitis EXAM: DG ABDOMEN ACUTE W/ 1V CHEST COMPARISON:  05/18/2019 FINDINGS: Cardiomegaly and vascular pedicle widening. Airspace opacity with pleural fluid at the right base. Normal bowel gas pattern. No evidence of pneumoperitoneum. No concerning mass effect but limited by overlapping artifact. Extensive arterial calcification IMPRESSION: 1. Normal bowel gas pattern.  No evidence of pneumoperitoneum. 2.  Chronic pleuroparenchymal opacification at the right base. Electronically Signed   By: Monte Fantasia M.D.   On: 05/28/2019 06:02       Subjective: Patient has no complaints.  Nausea vomiting has improved.  Would like to go home today. Discharge Exam: Vitals:   06/07/19 0602 06/07/19 0815  BP: (!) 195/102   Pulse: 77   Resp:    Temp: 98.1 F (36.7 C)   SpO2: 99% 99%   Vitals:   06/06/19 2125 06/07/19 0602 06/07/19 0602 06/07/19 0815  BP: (!) 178/91 (!) 195/102 (!) 195/102   Pulse: 78  77   Resp:      Temp: 97.7 F (36.5 C)  98.1 F (36.7 C)   TempSrc: Oral  Oral   SpO2: 97%  99% 99%  Weight:      Height:        General: Pt is alert,  awake, not in acute distress Cardiovascular: RRR, S1/S2 +, no rubs, no gallops Respiratory: CTA bilaterally, no wheezing, no rhonchi Abdominal: Soft, NT, ND, bowel sounds + Extremities: no edema, no cyanosis    The results of significant diagnostics from this hospitalization (including imaging, microbiology, ancillary and laboratory) are listed below for reference.     Microbiology: Recent Results (from the past 240 hour(s))  SARS CORONAVIRUS 2 (TAT 6-24 HRS) Nasopharyngeal Nasopharyngeal Swab     Status: None   Collection Time: 06/03/19  7:50 AM   Specimen: Nasopharyngeal Swab  Result Value Ref Range Status   SARS Coronavirus 2 NEGATIVE NEGATIVE Final    Comment: (NOTE) SARS-CoV-2 target nucleic acids are NOT DETECTED. The SARS-CoV-2 RNA is generally detectable in upper and lower respiratory specimens during the acute phase of infection. Negative results do not preclude SARS-CoV-2 infection, do not rule out co-infections with other pathogens, and should not be used as the sole basis for treatment or other patient management decisions. Negative results must be combined with clinical observations, patient history, and epidemiological information. The expected result is Negative. Fact Sheet for Patients: SugarRoll.be Fact Sheet for Healthcare Providers: https://www.woods-mathews.com/ This test is not yet approved or cleared by the [place] FDA and  has been authorized for detection and/or diagnosis of SARS-CoV-2 by FDA under an Emergency Use Authorization (EUA). This EUA will remain  in effect (meaning this test can be used) for the duration of the COVID-19 declaration under Section 56 4(b)(1) of the Act, 21 U.S.C. section 360bbb-3(b)(1), unless the authorization is terminated or revoked sooner. Performed at [facility], [place] [address] [place], [place] [address]      Labs: BNP (last 3 results) No results for input(s):  BNP in the last 8760 hours. Basic Metabolic Panel: Recent Labs  Lab 06/03/19 0525 06/03/19 1657 06/04/19 2012 06/05/19 0410 06/06/19 0321  NA 138 138 131* 136 138  K 6.5* 6.7* 4.6 4.2 4.1  CL 95* 95* 90* 92* 93*  CO2 16* 20* 21* 24 22  GLUCOSE 72 77 66* 61* 73  BUN 91* 100* 41* 44* 52*  CREATININE 15.67* 17.12* 9.11* 10.49* 13.65*  CALCIUM 7.9* 8.2* 8.5* 8.9 8.7*  PHOS  --   --  7.2*  --  8.7*   Liver Function Tests: Recent Labs  Lab 06/01/19 0450 06/03/19 0525 06/04/19 2012 06/06/19 0321  AST 23 13*  --   --   ALT 12 13  --   --   ALKPHOS 224* 203*  --   --   BILITOT 0.7 1.0  --   --  PROT 7.8 7.9  --   --   ALBUMIN 3.4* 3.6 3.4* 3.1*   Recent Labs  Lab 06/01/19 0450 06/03/19 0525  LIPASE 696* 231*   No results for input(s): AMMONIA in the last 168 hours. CBC: Recent Labs  Lab 06/01/19 0450 06/03/19 0525 06/04/19 0711 06/06/19 0419  WBC 3.6* 4.5 4.5 3.3*  NEUTROABS 2.0  --  3.2  --   HGB 10.5* 10.6* 9.6* 9.6*  HCT 33.1* 34.6* 29.6* 30.4*  MCV 89.5 90.6 86.5 87.6  PLT 172 161 156 144*   Cardiac Enzymes: No results for input(s): CKTOTAL, CKMB, CKMBINDEX, TROPONINI in the last 168 hours. BNP: Invalid input(s): POCBNP CBG: Recent Labs  Lab 06/05/19 1521 06/05/19 1639 06/05/19 1828 06/05/19 2013 06/06/19 1607  GLUCAP 100* 72 63* 113* 87   D-Dimer No results for input(s): DDIMER in the last 72 hours. Hgb A1c Recent Labs    06/06/19 0321  HGBA1C 5.0   Lipid Profile No results for input(s): CHOL, HDL, LDLCALC, TRIG, CHOLHDL, LDLDIRECT in the last 72 hours. Thyroid function studies No results for input(s): TSH, T4TOTAL, T3FREE, THYROIDAB in the last 72 hours.  Invalid input(s): FREET3 Anemia work up No results for input(s): VITAMINB12, FOLATE, FERRITIN, TIBC, IRON, RETICCTPCT in the last 72 hours. Urinalysis    Component Value Date/Time   COLORURINE YELLOW 10/18/2013 [place] 10/18/2013 0419   LABSPEC 1.008 10/18/2013  0419   PHURINE 8.5 (H) 10/18/2013 0419   GLUCOSEU 100 (A) 10/18/2013 0419   HGBUR TRACE (A) 10/18/2013 0419   BILIRUBINUR NEGATIVE 10/18/2013 0419   KETONESUR NEGATIVE 10/18/2013 0419   PROTEINUR 100 (A) 10/18/2013 0419   UROBILINOGEN 0.2 10/18/2013 0419   NITRITE NEGATIVE 10/18/2013 0419   LEUKOCYTESUR NEGATIVE 10/18/2013 0419   Sepsis Labs Invalid input(s): PROCALCITONIN,  WBC,  LACTICIDVEN Microbiology Recent Results (from the past 240 hour(s))  SARS CORONAVIRUS 2 (TAT 6-24 HRS) Nasopharyngeal Nasopharyngeal Swab     Status: None   Collection Time: 06/03/19  7:50 AM   Specimen: Nasopharyngeal Swab  Result Value Ref Range Status   SARS Coronavirus 2 NEGATIVE NEGATIVE Final    Comment: (NOTE) SARS-CoV-2 target nucleic acids are NOT DETECTED. The SARS-CoV-2 RNA is generally detectable in upper and lower respiratory specimens during the acute phase of infection. Negative results do not preclude SARS-CoV-2 infection, do not rule out co-infections with other pathogens, and should not be used as the sole basis for treatment or other patient management decisions. Negative results must be combined with clinical observations, patient history, and epidemiological information. The expected result is Negative. Fact Sheet for Patients: SugarRoll.be Fact Sheet for Healthcare Providers: https://www.woods-mathews.com/ This test is not yet approved or cleared by the [place] FDA and  has been authorized for detection and/or diagnosis of SARS-CoV-2 by FDA under an Emergency Use Authorization (EUA). This EUA will remain  in effect (meaning this test can be used) for the duration of the COVID-19 declaration under Section 56 4(b)(1) of the Act, 21 U.S.C. section 360bbb-3(b)(1), unless the authorization is terminated or revoked sooner. Performed at [facility], [name] [address], [place], [place] [address]      Time coordinating discharge:  Over 30 minutes  SIGNED:   Nolberto Hanlon, MD  [facility] 06/07/2019, 9:02 AM Pager   If 7PM-7AM, please contact night-coverage [email or web address] Password [email or web address]

## 2019-06-07 NOTE — Progress Notes (Signed)
Patient ate 50% of dinner meal without throwing up. Patient requesting iv Zofran.

## 2019-06-07 NOTE — Progress Notes (Signed)
Patient stated he gets his phosphate binder from [facility] prescribed by dialysis. Only needs zofran and protonix. Notified [facility] pharmacy and Dr. Kurtis Bushman.

## 2019-06-08 DIAGNOSIS — Z992 Dependence on renal dialysis: Secondary | ICD-10-CM | POA: Diagnosis not present

## 2019-06-08 DIAGNOSIS — E875 Hyperkalemia: Secondary | ICD-10-CM | POA: Diagnosis not present

## 2019-06-08 DIAGNOSIS — N186 End stage renal disease: Secondary | ICD-10-CM | POA: Diagnosis not present

## 2019-06-08 DIAGNOSIS — N2581 Secondary hyperparathyroidism of renal origin: Secondary | ICD-10-CM | POA: Diagnosis not present

## 2019-06-08 DIAGNOSIS — D509 Iron deficiency anemia, unspecified: Secondary | ICD-10-CM | POA: Diagnosis not present

## 2019-06-08 DIAGNOSIS — D631 Anemia in chronic kidney disease: Secondary | ICD-10-CM | POA: Diagnosis not present

## 2019-06-08 DIAGNOSIS — Z86718 Personal history of other venous thrombosis and embolism: Secondary | ICD-10-CM | POA: Diagnosis not present

## 2019-06-09 ENCOUNTER — Encounter (HOSPITAL_COMMUNITY): Payer: Self-pay

## 2019-06-09 ENCOUNTER — Emergency Department (HOSPITAL_COMMUNITY)
Admission: EM | Admit: 2019-06-09 | Discharge: 2019-06-09 | Disposition: A | Discharge status: Home / Self Care | Payer: Medicare Other | Attending: Emergency Medicine | Admitting: Emergency Medicine

## 2019-06-09 ENCOUNTER — Other Ambulatory Visit: Payer: Self-pay

## 2019-06-09 DIAGNOSIS — Z992 Dependence on renal dialysis: Secondary | ICD-10-CM | POA: Insufficient documentation

## 2019-06-09 DIAGNOSIS — Z7901 Long term (current) use of anticoagulants: Secondary | ICD-10-CM | POA: Insufficient documentation

## 2019-06-09 DIAGNOSIS — R748 Abnormal levels of other serum enzymes: Secondary | ICD-10-CM

## 2019-06-09 DIAGNOSIS — R252 Cramp and spasm: Secondary | ICD-10-CM | POA: Diagnosis not present

## 2019-06-09 DIAGNOSIS — N186 End stage renal disease: Secondary | ICD-10-CM

## 2019-06-09 DIAGNOSIS — N189 Chronic kidney disease, unspecified: Secondary | ICD-10-CM

## 2019-06-09 DIAGNOSIS — Z66 Do not resuscitate: Secondary | ICD-10-CM | POA: Diagnosis not present

## 2019-06-09 DIAGNOSIS — D631 Anemia in chronic kidney disease: Secondary | ICD-10-CM | POA: Diagnosis not present

## 2019-06-09 DIAGNOSIS — Z79899 Other long term (current) drug therapy: Secondary | ICD-10-CM | POA: Diagnosis not present

## 2019-06-09 DIAGNOSIS — I132 Hypertensive heart and chronic kidney disease with heart failure and with stage 5 chronic kidney disease, or end stage renal disease: Secondary | ICD-10-CM | POA: Diagnosis not present

## 2019-06-09 DIAGNOSIS — E1122 Type 2 diabetes mellitus with diabetic chronic kidney disease: Secondary | ICD-10-CM | POA: Insufficient documentation

## 2019-06-09 DIAGNOSIS — Z87891 Personal history of nicotine dependence: Secondary | ICD-10-CM | POA: Insufficient documentation

## 2019-06-09 DIAGNOSIS — I120 Hypertensive chronic kidney disease with stage 5 chronic kidney disease or end stage renal disease: Secondary | ICD-10-CM | POA: Diagnosis not present

## 2019-06-09 DIAGNOSIS — I5020 Unspecified systolic (congestive) heart failure: Secondary | ICD-10-CM | POA: Insufficient documentation

## 2019-06-09 DIAGNOSIS — R112 Nausea with vomiting, unspecified: Secondary | ICD-10-CM

## 2019-06-09 LAB — CBC WITH DIFFERENTIAL/PLATELET
Abs Immature Granulocytes: 0.01 10*3/uL (ref 0.00–0.07)
Basophils Absolute: 0 10*3/uL (ref 0.0–0.1)
Basophils Relative: 0 %
Eosinophils Absolute: 0.3 10*3/uL (ref 0.0–0.5)
Eosinophils Relative: 5 %
HCT: 34.3 % — ABNORMAL LOW (ref 39.0–52.0)
Hemoglobin: 10.4 g/dL — ABNORMAL LOW (ref 13.0–17.0)
Immature Granulocytes: 0 %
Lymphocytes Relative: 9 %
Lymphs Abs: 0.5 10*3/uL — ABNORMAL LOW (ref 0.7–4.0)
MCH: 27.2 pg (ref 26.0–34.0)
MCHC: 30.3 g/dL (ref 30.0–36.0)
MCV: 89.8 fL (ref 80.0–100.0)
Monocytes Absolute: 0.8 10*3/uL (ref 0.1–1.0)
Monocytes Relative: 16 %
Neutro Abs: 3.5 10*3/uL (ref 1.7–7.7)
Neutrophils Relative %: 70 %
Platelets: 165 10*3/uL (ref 150–400)
RBC: 3.82 MIL/uL — ABNORMAL LOW (ref 4.22–5.81)
RDW: 15.8 % — ABNORMAL HIGH (ref 11.5–15.5)
WBC: 5 10*3/uL (ref 4.0–10.5)
nRBC: 0 % (ref 0.0–0.2)

## 2019-06-09 LAB — COMPREHENSIVE METABOLIC PANEL
ALT: 9 U/L (ref 0–44)
AST: 13 U/L — ABNORMAL LOW (ref 15–41)
Albumin: 3.3 g/dL — ABNORMAL LOW (ref 3.5–5.0)
Alkaline Phosphatase: 188 U/L — ABNORMAL HIGH (ref 38–126)
Anion gap: 14 (ref 5–15)
BUN: 26 mg/dL — ABNORMAL HIGH (ref 6–20)
CO2: 30 mmol/L (ref 22–32)
Calcium: 8.5 mg/dL — ABNORMAL LOW (ref 8.9–10.3)
Chloride: 95 mmol/L — ABNORMAL LOW (ref 98–111)
Creatinine, Ser: 8.22 mg/dL — ABNORMAL HIGH (ref 0.61–1.24)
GFR calc Af Amer: 8 mL/min — ABNORMAL LOW (ref >60)
GFR calc non Af Amer: 7 mL/min — ABNORMAL LOW (ref >60)
Glucose, Bld: 74 mg/dL (ref 70–99)
Potassium: 3.6 mmol/L (ref 3.5–5.1)
Sodium: 139 mmol/L (ref 135–145)
Total Bilirubin: 0.4 mg/dL (ref 0.3–1.2)
Total Protein: 7.7 g/dL (ref 6.5–8.1)

## 2019-06-09 LAB — LIPASE, BLOOD: Lipase: 61 U/L — ABNORMAL HIGH (ref 11–51)

## 2019-06-09 LAB — MAGNESIUM: Magnesium: 1.9 mg/dL (ref 1.7–2.4)

## 2019-06-09 MED ORDER — PROCHLORPERAZINE 25 MG RE SUPP: 25.0000 mg | Freq: Two times a day (BID) | RECTAL | 0 refills | Status: DC | PRN

## 2019-06-09 MED ORDER — ONDANSETRON 8 MG PO TBDP
8.0000 mg | ORAL_TABLET | Freq: Once | ORAL | Status: AC
  Administered 2019-06-09: 8 mg via ORAL
  Filled 2019-06-09: qty 1

## 2019-06-09 MED ORDER — ONDANSETRON 8 MG PO TBDP: 8.0000 mg | ORAL_TABLET | Freq: Three times a day (TID) | ORAL | 0 refills | Status: DC | PRN

## 2019-06-09 MED ORDER — SODIUM CHLORIDE 0.9 % IV BOLUS: 500.0000 mL | Freq: Once | INTRAVENOUS | Status: DC

## 2019-06-09 MED ORDER — ONDANSETRON HCL 4 MG/2ML IJ SOLN: 4.0000 mg | Freq: Once | INTRAMUSCULAR | Status: DC
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
## 2019-06-09 NOTE — ED Triage Notes (Signed)
Pt states that he started vomiting after dialysis today. Reports continued chronic upper abdominal pain as well.

## 2019-06-10 ENCOUNTER — Other Ambulatory Visit: Payer: Self-pay

## 2019-06-10 ENCOUNTER — Encounter (HOSPITAL_COMMUNITY): Payer: Self-pay | Admitting: Emergency Medicine

## 2019-06-10 ENCOUNTER — Emergency Department (HOSPITAL_COMMUNITY)
Admission: EM | Admit: 2019-06-10 | Discharge: 2019-06-11 | Discharge status: Against Medical Advice | Payer: Medicare Other | Attending: Emergency Medicine | Admitting: Emergency Medicine

## 2019-06-10 DIAGNOSIS — Z79899 Other long term (current) drug therapy: Secondary | ICD-10-CM | POA: Insufficient documentation

## 2019-06-10 DIAGNOSIS — I5042 Chronic combined systolic (congestive) and diastolic (congestive) heart failure: Secondary | ICD-10-CM | POA: Insufficient documentation

## 2019-06-10 DIAGNOSIS — Z87891 Personal history of nicotine dependence: Secondary | ICD-10-CM | POA: Insufficient documentation

## 2019-06-10 DIAGNOSIS — Z902 Acquired absence of lung [part of]: Secondary | ICD-10-CM | POA: Diagnosis not present

## 2019-06-10 DIAGNOSIS — Z7901 Long term (current) use of anticoagulants: Secondary | ICD-10-CM | POA: Diagnosis not present

## 2019-06-10 DIAGNOSIS — I132 Hypertensive heart and chronic kidney disease with heart failure and with stage 5 chronic kidney disease, or end stage renal disease: Secondary | ICD-10-CM | POA: Insufficient documentation

## 2019-06-10 DIAGNOSIS — Z9861 Coronary angioplasty status: Secondary | ICD-10-CM | POA: Diagnosis not present

## 2019-06-10 DIAGNOSIS — R1013 Epigastric pain: Secondary | ICD-10-CM | POA: Diagnosis not present

## 2019-06-10 DIAGNOSIS — Z86718 Personal history of other venous thrombosis and embolism: Secondary | ICD-10-CM | POA: Insufficient documentation

## 2019-06-10 DIAGNOSIS — Z86711 Personal history of pulmonary embolism: Secondary | ICD-10-CM | POA: Insufficient documentation

## 2019-06-10 DIAGNOSIS — R1113 Vomiting of fecal matter: Secondary | ICD-10-CM | POA: Insufficient documentation

## 2019-06-10 DIAGNOSIS — G8929 Other chronic pain: Secondary | ICD-10-CM | POA: Diagnosis not present

## 2019-06-10 DIAGNOSIS — N186 End stage renal disease: Secondary | ICD-10-CM | POA: Insufficient documentation

## 2019-06-10 DIAGNOSIS — R1011 Right upper quadrant pain: Secondary | ICD-10-CM | POA: Diagnosis present

## 2019-06-10 DIAGNOSIS — E1122 Type 2 diabetes mellitus with diabetic chronic kidney disease: Secondary | ICD-10-CM | POA: Diagnosis not present

## 2019-06-10 MED ORDER — SODIUM CHLORIDE 0.9% FLUSH: 3.0000 mL | Freq: Once | INTRAVENOUS | Status: DC

## 2019-06-10 NOTE — ED Notes (Signed)
Patient refused EKG at triage .

## 2019-06-10 NOTE — ED Triage Notes (Signed)
Patient reports right upper abdominal pain with emesis onset yesterday , denies fever or chills .

## 2019-06-10 NOTE — ED Notes (Signed)
Pt does not want a EKG at this moment. 4 attempts made at blood pressure but unable to get one and pt does not want to try again.

## 2019-06-11 ENCOUNTER — Encounter (HOSPITAL_BASED_OUTPATIENT_CLINIC_OR_DEPARTMENT_OTHER): Payer: Self-pay | Admitting: Emergency Medicine

## 2019-06-11 ENCOUNTER — Emergency Department (HOSPITAL_BASED_OUTPATIENT_CLINIC_OR_DEPARTMENT_OTHER)
Admission: EM | Admit: 2019-06-11 | Discharge: 2019-06-11 | Disposition: A | Discharge status: Home / Self Care | Payer: Medicare Other | Source: Home / Self Care | Attending: Emergency Medicine | Admitting: Emergency Medicine

## 2019-06-11 ENCOUNTER — Other Ambulatory Visit: Payer: Self-pay

## 2019-06-11 DIAGNOSIS — Z86718 Personal history of other venous thrombosis and embolism: Secondary | ICD-10-CM | POA: Insufficient documentation

## 2019-06-11 DIAGNOSIS — Z86711 Personal history of pulmonary embolism: Secondary | ICD-10-CM | POA: Insufficient documentation

## 2019-06-11 DIAGNOSIS — R1013 Epigastric pain: Secondary | ICD-10-CM | POA: Insufficient documentation

## 2019-06-11 DIAGNOSIS — I5042 Chronic combined systolic (congestive) and diastolic (congestive) heart failure: Secondary | ICD-10-CM | POA: Insufficient documentation

## 2019-06-11 DIAGNOSIS — G8929 Other chronic pain: Secondary | ICD-10-CM | POA: Insufficient documentation

## 2019-06-11 DIAGNOSIS — R109 Unspecified abdominal pain: Secondary | ICD-10-CM

## 2019-06-11 DIAGNOSIS — Z79899 Other long term (current) drug therapy: Secondary | ICD-10-CM | POA: Insufficient documentation

## 2019-06-11 DIAGNOSIS — Z87891 Personal history of nicotine dependence: Secondary | ICD-10-CM | POA: Insufficient documentation

## 2019-06-11 DIAGNOSIS — R1113 Vomiting of fecal matter: Secondary | ICD-10-CM | POA: Insufficient documentation

## 2019-06-11 DIAGNOSIS — Z9861 Coronary angioplasty status: Secondary | ICD-10-CM | POA: Insufficient documentation

## 2019-06-11 DIAGNOSIS — E1122 Type 2 diabetes mellitus with diabetic chronic kidney disease: Secondary | ICD-10-CM | POA: Insufficient documentation

## 2019-06-11 DIAGNOSIS — N186 End stage renal disease: Secondary | ICD-10-CM | POA: Insufficient documentation

## 2019-06-11 DIAGNOSIS — Z7901 Long term (current) use of anticoagulants: Secondary | ICD-10-CM | POA: Insufficient documentation

## 2019-06-11 DIAGNOSIS — I132 Hypertensive heart and chronic kidney disease with heart failure and with stage 5 chronic kidney disease, or end stage renal disease: Secondary | ICD-10-CM | POA: Insufficient documentation

## 2019-06-11 DIAGNOSIS — Z992 Dependence on renal dialysis: Secondary | ICD-10-CM | POA: Insufficient documentation

## 2019-06-11 MED ORDER — HYDROMORPHONE HCL 1 MG/ML IJ SOLN
2.0000 mg | Freq: Once | INTRAMUSCULAR | Status: AC
  Administered 2019-06-11: 2 mg via INTRAMUSCULAR
  Filled 2019-06-11: qty 2

## 2019-06-11 MED ORDER — PROMETHAZINE HCL 25 MG/ML IJ SOLN
25.0000 mg | Freq: Once | INTRAMUSCULAR | Status: AC
  Administered 2019-06-11: 25 mg via INTRAMUSCULAR

## 2019-06-11 MED ORDER — PROMETHAZINE HCL 25 MG/ML IJ SOLN
INTRAMUSCULAR | Status: AC
  Filled 2019-06-11: qty 1

## 2019-06-11 NOTE — ED Notes (Signed)
Attempted blood draw times 2. Pt pulled arm away each time. Unsuccessful. MD aware. Pt states pain "better".

## 2019-06-11 NOTE — ED Notes (Signed)
PT at [facility] ED ealier and left before being seen.

## 2019-06-11 NOTE — ED Notes (Signed)
Pt states he is leaving due to wait.

## 2019-06-11 NOTE — ED Triage Notes (Signed)
Abdominal pain/vomiting started earlier today at dialysis. Pt states " it's my pancreatitis"

## 2019-06-11 NOTE — ED Notes (Signed)
Pt awakened and states ride will be here at 0630.

## 2019-06-11 NOTE — ED Notes (Signed)
No emesis since arrival.

## 2019-06-11 NOTE — ED Notes (Signed)
Pt Discharged. Pt states ride will not be here until 630. Pt will be allowed to sleep until ride is available.
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
## 2019-06-11 NOTE — ED Notes (Signed)
PT wants to wait 10 minutes before attempting blood draw.

## 2019-06-11 NOTE — ED Notes (Signed)
ED Provider at bedside. 

## 2019-06-12 ENCOUNTER — Emergency Department (HOSPITAL_BASED_OUTPATIENT_CLINIC_OR_DEPARTMENT_OTHER)
Admission: EM | Admit: 2019-06-12 | Discharge: 2019-06-12 | Disposition: A | Discharge status: Home / Self Care | Payer: Medicare Other | Attending: Emergency Medicine | Admitting: Emergency Medicine

## 2019-06-12 ENCOUNTER — Encounter (HOSPITAL_BASED_OUTPATIENT_CLINIC_OR_DEPARTMENT_OTHER): Payer: Self-pay | Admitting: Emergency Medicine

## 2019-06-12 ENCOUNTER — Other Ambulatory Visit: Payer: Self-pay

## 2019-06-12 DIAGNOSIS — Z992 Dependence on renal dialysis: Secondary | ICD-10-CM | POA: Insufficient documentation

## 2019-06-12 DIAGNOSIS — Z87891 Personal history of nicotine dependence: Secondary | ICD-10-CM | POA: Insufficient documentation

## 2019-06-12 DIAGNOSIS — R1013 Epigastric pain: Secondary | ICD-10-CM | POA: Insufficient documentation

## 2019-06-12 DIAGNOSIS — Z86711 Personal history of pulmonary embolism: Secondary | ICD-10-CM | POA: Insufficient documentation

## 2019-06-12 DIAGNOSIS — Z86718 Personal history of other venous thrombosis and embolism: Secondary | ICD-10-CM | POA: Diagnosis not present

## 2019-06-12 DIAGNOSIS — E1122 Type 2 diabetes mellitus with diabetic chronic kidney disease: Secondary | ICD-10-CM | POA: Diagnosis not present

## 2019-06-12 DIAGNOSIS — Z7901 Long term (current) use of anticoagulants: Secondary | ICD-10-CM | POA: Insufficient documentation

## 2019-06-12 DIAGNOSIS — G8929 Other chronic pain: Secondary | ICD-10-CM | POA: Diagnosis not present

## 2019-06-12 DIAGNOSIS — D509 Iron deficiency anemia, unspecified: Secondary | ICD-10-CM | POA: Diagnosis not present

## 2019-06-12 DIAGNOSIS — Z79899 Other long term (current) drug therapy: Secondary | ICD-10-CM | POA: Diagnosis not present

## 2019-06-12 DIAGNOSIS — E875 Hyperkalemia: Secondary | ICD-10-CM | POA: Diagnosis not present

## 2019-06-12 DIAGNOSIS — I132 Hypertensive heart and chronic kidney disease with heart failure and with stage 5 chronic kidney disease, or end stage renal disease: Secondary | ICD-10-CM | POA: Diagnosis not present

## 2019-06-12 DIAGNOSIS — D631 Anemia in chronic kidney disease: Secondary | ICD-10-CM | POA: Diagnosis not present

## 2019-06-12 DIAGNOSIS — Z888 Allergy status to other drugs, medicaments and biological substances status: Secondary | ICD-10-CM | POA: Diagnosis not present

## 2019-06-12 DIAGNOSIS — N186 End stage renal disease: Secondary | ICD-10-CM | POA: Insufficient documentation

## 2019-06-12 DIAGNOSIS — N2581 Secondary hyperparathyroidism of renal origin: Secondary | ICD-10-CM | POA: Diagnosis not present

## 2019-06-12 DIAGNOSIS — Z7984 Long term (current) use of oral hypoglycemic drugs: Secondary | ICD-10-CM | POA: Insufficient documentation

## 2019-06-12 DIAGNOSIS — I5042 Chronic combined systolic (congestive) and diastolic (congestive) heart failure: Secondary | ICD-10-CM | POA: Insufficient documentation

## 2019-06-12 MED ORDER — KETOROLAC TROMETHAMINE 60 MG/2ML IM SOLN
30.0000 mg | Freq: Once | INTRAMUSCULAR | Status: AC
  Administered 2019-06-12: 30 mg via INTRAMUSCULAR
  Filled 2019-06-12: qty 2

## 2019-06-12 MED ORDER — ALUM & MAG HYDROXIDE-SIMETH 200-200-20 MG/5ML PO SUSP
30.0000 mL | Freq: Once | ORAL | Status: AC
  Administered 2019-06-12: 30 mL via ORAL
  Filled 2019-06-12: qty 30

## 2019-06-12 MED ORDER — LIDOCAINE VISCOUS HCL 2 % MT SOLN
15.0000 mL | Freq: Once | OROMUCOSAL | Status: AC
  Administered 2019-06-12: 15 mL via ORAL
  Filled 2019-06-12: qty 15

## 2019-06-12 NOTE — ED Triage Notes (Signed)
Pt reporting "pacreatitis flareup" for 2-3 days. Right sided abdominal pain, vomited x 5, diarrhea x 1. Last dialysis Thursday.
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
## 2019-06-13 ENCOUNTER — Encounter (HOSPITAL_COMMUNITY): Payer: Self-pay | Admitting: Emergency Medicine

## 2019-06-13 ENCOUNTER — Other Ambulatory Visit: Payer: Self-pay

## 2019-06-13 ENCOUNTER — Emergency Department (HOSPITAL_COMMUNITY)
Admission: EM | Admit: 2019-06-13 | Discharge: 2019-06-13 | Disposition: A | Discharge status: Home / Self Care | Payer: Medicare Other | Attending: Emergency Medicine | Admitting: Emergency Medicine

## 2019-06-13 DIAGNOSIS — R1084 Generalized abdominal pain: Secondary | ICD-10-CM

## 2019-06-13 DIAGNOSIS — G8929 Other chronic pain: Secondary | ICD-10-CM | POA: Diagnosis not present

## 2019-06-13 DIAGNOSIS — Z7901 Long term (current) use of anticoagulants: Secondary | ICD-10-CM | POA: Diagnosis not present

## 2019-06-13 DIAGNOSIS — R1011 Right upper quadrant pain: Secondary | ICD-10-CM | POA: Insufficient documentation

## 2019-06-13 DIAGNOSIS — I132 Hypertensive heart and chronic kidney disease with heart failure and with stage 5 chronic kidney disease, or end stage renal disease: Secondary | ICD-10-CM | POA: Insufficient documentation

## 2019-06-13 DIAGNOSIS — R1013 Epigastric pain: Secondary | ICD-10-CM | POA: Diagnosis not present

## 2019-06-13 DIAGNOSIS — Z992 Dependence on renal dialysis: Secondary | ICD-10-CM | POA: Diagnosis not present

## 2019-06-13 DIAGNOSIS — E119 Type 2 diabetes mellitus without complications: Secondary | ICD-10-CM | POA: Diagnosis not present

## 2019-06-13 DIAGNOSIS — N186 End stage renal disease: Secondary | ICD-10-CM | POA: Insufficient documentation

## 2019-06-13 DIAGNOSIS — Z87891 Personal history of nicotine dependence: Secondary | ICD-10-CM | POA: Insufficient documentation

## 2019-06-13 DIAGNOSIS — Z79899 Other long term (current) drug therapy: Secondary | ICD-10-CM | POA: Insufficient documentation

## 2019-06-13 DIAGNOSIS — I5042 Chronic combined systolic (congestive) and diastolic (congestive) heart failure: Secondary | ICD-10-CM | POA: Diagnosis not present

## 2019-06-13 MED ORDER — HYDROMORPHONE HCL 2 MG/ML IJ SOLN
2.0000 mg | Freq: Once | INTRAMUSCULAR | Status: AC
  Administered 2019-06-13: 2 mg via INTRAVENOUS
  Filled 2019-06-13: qty 1

## 2019-06-13 MED ORDER — HYDROMORPHONE HCL 2 MG/ML IJ SOLN
2.0000 mg | Freq: Once | INTRAMUSCULAR | Status: AC
  Administered 2019-06-13: 2 mg via INTRAMUSCULAR
  Filled 2019-06-13: qty 1

## 2019-06-13 MED ORDER — ONDANSETRON 8 MG PO TBDP
8.0000 mg | ORAL_TABLET | Freq: Once | ORAL | Status: AC
  Administered 2019-06-13: 8 mg via ORAL
  Filled 2019-06-13: qty 1
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
## 2019-06-13 NOTE — Discharge Instructions (Addendum)
Continue your medications and dialysis as previously scheduled.

## 2019-06-13 NOTE — ED Triage Notes (Signed)
Pt arriving POV with complaint of "pancreatitis flareup". Pt was seen at [facility] yesterday for same. Pt reports symptoms have been present for 2-3 days. N/V/D and right side abd pain.

## 2019-06-14 DIAGNOSIS — N186 End stage renal disease: Secondary | ICD-10-CM | POA: Diagnosis not present

## 2019-06-14 DIAGNOSIS — D509 Iron deficiency anemia, unspecified: Secondary | ICD-10-CM | POA: Diagnosis not present

## 2019-06-14 DIAGNOSIS — Z992 Dependence on renal dialysis: Secondary | ICD-10-CM | POA: Diagnosis not present

## 2019-06-14 DIAGNOSIS — M62830 Muscle spasm of back: Secondary | ICD-10-CM | POA: Diagnosis not present

## 2019-06-14 DIAGNOSIS — E875 Hyperkalemia: Secondary | ICD-10-CM | POA: Diagnosis not present

## 2019-06-14 DIAGNOSIS — N2581 Secondary hyperparathyroidism of renal origin: Secondary | ICD-10-CM | POA: Diagnosis not present

## 2019-06-14 DIAGNOSIS — K219 Gastro-esophageal reflux disease without esophagitis: Secondary | ICD-10-CM | POA: Diagnosis not present

## 2019-06-14 DIAGNOSIS — G8929 Other chronic pain: Secondary | ICD-10-CM | POA: Diagnosis not present

## 2019-06-14 DIAGNOSIS — D631 Anemia in chronic kidney disease: Secondary | ICD-10-CM | POA: Diagnosis not present

## 2019-06-14 DIAGNOSIS — Z09 Encounter for follow-up examination after completed treatment for conditions other than malignant neoplasm: Secondary | ICD-10-CM | POA: Diagnosis not present

## 2019-06-14 DIAGNOSIS — M545 Low back pain: Secondary | ICD-10-CM | POA: Diagnosis not present

## 2019-06-16 ENCOUNTER — Emergency Department (HOSPITAL_COMMUNITY)
Admission: EM | Admit: 2019-06-16 | Discharge: 2019-06-16 | Disposition: A | Discharge status: Home / Self Care | Payer: Medicare Other | Source: Home / Self Care | Attending: Emergency Medicine | Admitting: Emergency Medicine

## 2019-06-16 ENCOUNTER — Other Ambulatory Visit: Payer: Self-pay | Admitting: *Deleted

## 2019-06-16 ENCOUNTER — Other Ambulatory Visit: Payer: Self-pay

## 2019-06-16 DIAGNOSIS — Z20828 Contact with and (suspected) exposure to other viral communicable diseases: Secondary | ICD-10-CM | POA: Diagnosis not present

## 2019-06-16 DIAGNOSIS — Z992 Dependence on renal dialysis: Secondary | ICD-10-CM | POA: Insufficient documentation

## 2019-06-16 DIAGNOSIS — I10 Essential (primary) hypertension: Secondary | ICD-10-CM

## 2019-06-16 DIAGNOSIS — I5042 Chronic combined systolic (congestive) and diastolic (congestive) heart failure: Secondary | ICD-10-CM | POA: Insufficient documentation

## 2019-06-16 DIAGNOSIS — K861 Other chronic pancreatitis: Secondary | ICD-10-CM | POA: Diagnosis not present

## 2019-06-16 DIAGNOSIS — E875 Hyperkalemia: Secondary | ICD-10-CM | POA: Diagnosis not present

## 2019-06-16 DIAGNOSIS — I132 Hypertensive heart and chronic kidney disease with heart failure and with stage 5 chronic kidney disease, or end stage renal disease: Secondary | ICD-10-CM | POA: Insufficient documentation

## 2019-06-16 DIAGNOSIS — N2581 Secondary hyperparathyroidism of renal origin: Secondary | ICD-10-CM | POA: Diagnosis not present

## 2019-06-16 DIAGNOSIS — R110 Nausea: Secondary | ICD-10-CM

## 2019-06-16 DIAGNOSIS — Z79899 Other long term (current) drug therapy: Secondary | ICD-10-CM | POA: Insufficient documentation

## 2019-06-16 DIAGNOSIS — D631 Anemia in chronic kidney disease: Secondary | ICD-10-CM | POA: Diagnosis not present

## 2019-06-16 DIAGNOSIS — D509 Iron deficiency anemia, unspecified: Secondary | ICD-10-CM | POA: Diagnosis not present

## 2019-06-16 DIAGNOSIS — G8929 Other chronic pain: Secondary | ICD-10-CM

## 2019-06-16 DIAGNOSIS — N186 End stage renal disease: Secondary | ICD-10-CM | POA: Insufficient documentation

## 2019-06-16 DIAGNOSIS — R1084 Generalized abdominal pain: Secondary | ICD-10-CM | POA: Insufficient documentation

## 2019-06-16 DIAGNOSIS — R112 Nausea with vomiting, unspecified: Secondary | ICD-10-CM | POA: Insufficient documentation

## 2019-06-16 DIAGNOSIS — Z87891 Personal history of nicotine dependence: Secondary | ICD-10-CM | POA: Insufficient documentation

## 2019-06-16 DIAGNOSIS — Z7901 Long term (current) use of anticoagulants: Secondary | ICD-10-CM | POA: Insufficient documentation

## 2019-06-16 DIAGNOSIS — Z03818 Encounter for observation for suspected exposure to other biological agents ruled out: Secondary | ICD-10-CM | POA: Diagnosis not present

## 2019-06-16 DIAGNOSIS — I428 Other cardiomyopathies: Secondary | ICD-10-CM | POA: Diagnosis not present

## 2019-06-16 DIAGNOSIS — K922 Gastrointestinal hemorrhage, unspecified: Secondary | ICD-10-CM | POA: Diagnosis not present

## 2019-06-16 DIAGNOSIS — K921 Melena: Secondary | ICD-10-CM | POA: Diagnosis not present

## 2019-06-16 DIAGNOSIS — R109 Unspecified abdominal pain: Secondary | ICD-10-CM

## 2019-06-16 IMAGING — DX DG CHEST 1V PORT
1 series · 1 of 1 positions shown · non-contrast
Comparison: Chest radiograph 01/11/2017.

CLINICAL DATA: Patient with lower chest pain, nausea and vomiting.

EXAM:
PORTABLE CHEST 1 VIEW

[chest ap]
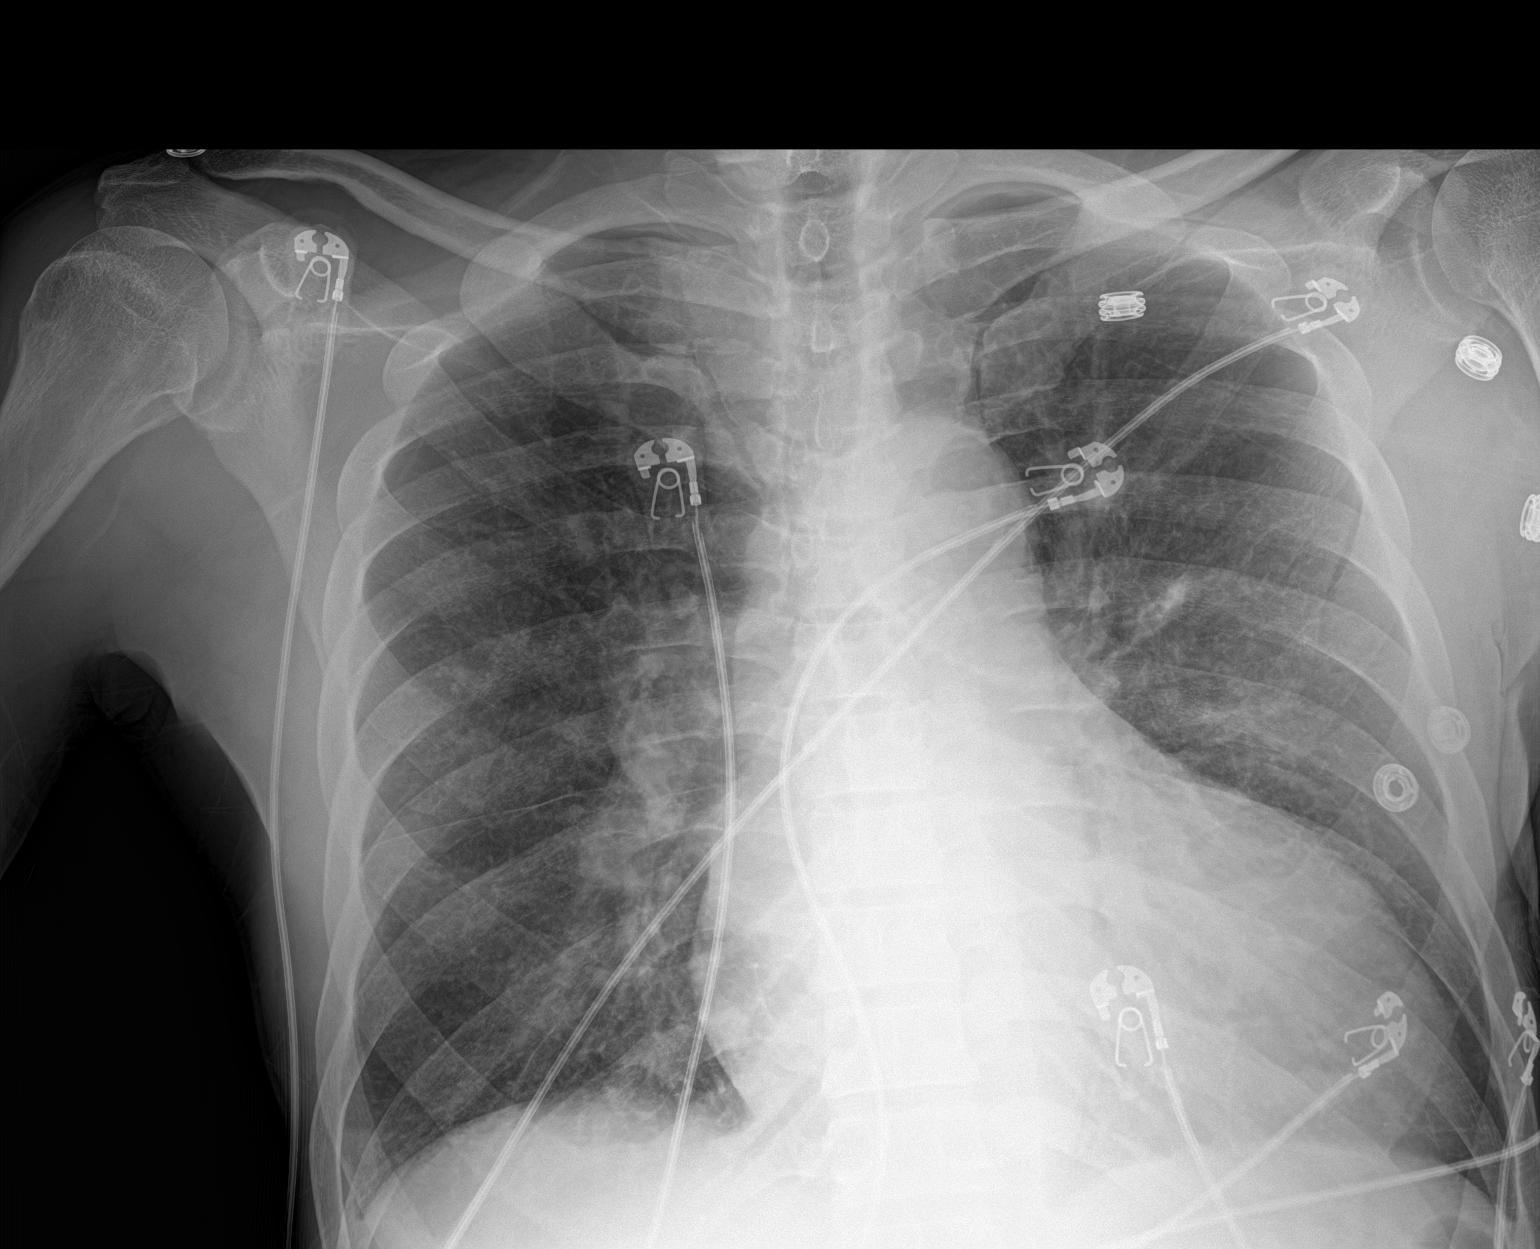

[1 of 1 positions shown; findings below may reference images not displayed]

FINDINGS: Monitoring leads overlie the patient. Stable cardiomegaly. Mild
perihilar interstitial pulmonary opacities. Minimal linear opacities
left mid lung. No pleural effusion or pneumothorax.
IMPRESSION: Cardiomegaly. Interstitial opacities favored to represent mild
edema.

## 2019-06-16 MED ORDER — OXYCODONE HCL 5 MG PO TABS
10.0000 mg | ORAL_TABLET | Freq: Once | ORAL | Status: AC
  Administered 2019-06-16: 05:00:00 10 mg via ORAL
  Filled 2019-06-16: qty 2

## 2019-06-16 MED ORDER — PROMETHAZINE HCL 25 MG/ML IJ SOLN
25.0000 mg | Freq: Once | INTRAMUSCULAR | Status: AC
  Administered 2019-06-16: 25 mg via INTRAMUSCULAR
  Filled 2019-06-16: qty 1

## 2019-06-16 NOTE — Patient Outreach (Addendum)
Per Patient [place] member has had numerous ED visits. Most recent ED visit was today.   Member assessed for potential [facility]  needs as a benefit of [employer].   Outreach attempt made to Mr. Brandenburg at [phone]. This number is no longer in service. Telephone call made to member's sister on file who states Mr. Fraticelli numbers change all the time. Confirms new number is [phone]. Attempted to call this number. No answer. HIPAA compliant voicemail message left requesting call back.  Referral made to [facility].     Marthenia Rolling, MSN-Ed, RN,BSN [facility] Acute Care Coordinator [phone] [name]) [phone]  (Toll free office)

## 2019-06-16 NOTE — ED Triage Notes (Signed)
Pt came in [place] stating he has been experiencing n/v for the past 2 days. No medications have been taken, Dialysis patient Tue/Thur/Sat.

## 2019-06-16 NOTE — Addendum Note (Signed)
Addended by: Harless Litten on: 06/16/2019 02:47 PM   Modules accepted: Orders
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
## 2019-06-17 ENCOUNTER — Inpatient Hospital Stay (HOSPITAL_COMMUNITY): Payer: Medicare Other

## 2019-06-17 ENCOUNTER — Inpatient Hospital Stay (HOSPITAL_COMMUNITY)
Admission: EM | Admit: 2019-06-17 | Discharge: 2019-06-20 | DRG: 377 | Discharge status: Against Medical Advice | Payer: Medicare Other | Attending: Internal Medicine | Admitting: Internal Medicine

## 2019-06-17 ENCOUNTER — Other Ambulatory Visit: Payer: Self-pay

## 2019-06-17 ENCOUNTER — Encounter (HOSPITAL_COMMUNITY): Payer: Self-pay | Admitting: Emergency Medicine

## 2019-06-17 DIAGNOSIS — N186 End stage renal disease: Secondary | ICD-10-CM | POA: Diagnosis present

## 2019-06-17 DIAGNOSIS — Z992 Dependence on renal dialysis: Secondary | ICD-10-CM | POA: Diagnosis not present

## 2019-06-17 DIAGNOSIS — R1110 Vomiting, unspecified: Secondary | ICD-10-CM | POA: Diagnosis not present

## 2019-06-17 DIAGNOSIS — R112 Nausea with vomiting, unspecified: Secondary | ICD-10-CM | POA: Diagnosis not present

## 2019-06-17 DIAGNOSIS — T8612 Kidney transplant failure: Secondary | ICD-10-CM | POA: Diagnosis present

## 2019-06-17 DIAGNOSIS — I428 Other cardiomyopathies: Secondary | ICD-10-CM

## 2019-06-17 DIAGNOSIS — E785 Hyperlipidemia, unspecified: Secondary | ICD-10-CM | POA: Diagnosis present

## 2019-06-17 DIAGNOSIS — I10 Essential (primary) hypertension: Secondary | ICD-10-CM

## 2019-06-17 DIAGNOSIS — Z86711 Personal history of pulmonary embolism: Secondary | ICD-10-CM | POA: Diagnosis present

## 2019-06-17 DIAGNOSIS — Z20828 Contact with and (suspected) exposure to other viral communicable diseases: Secondary | ICD-10-CM | POA: Diagnosis present

## 2019-06-17 DIAGNOSIS — K921 Melena: Secondary | ICD-10-CM | POA: Diagnosis present

## 2019-06-17 DIAGNOSIS — Z87891 Personal history of nicotine dependence: Secondary | ICD-10-CM | POA: Diagnosis not present

## 2019-06-17 DIAGNOSIS — I2699 Other pulmonary embolism without acute cor pulmonale: Secondary | ICD-10-CM | POA: Diagnosis present

## 2019-06-17 DIAGNOSIS — M898X9 Other specified disorders of bone, unspecified site: Secondary | ICD-10-CM | POA: Diagnosis present

## 2019-06-17 DIAGNOSIS — E1129 Type 2 diabetes mellitus with other diabetic kidney complication: Secondary | ICD-10-CM | POA: Diagnosis not present

## 2019-06-17 DIAGNOSIS — I160 Hypertensive urgency: Secondary | ICD-10-CM | POA: Diagnosis present

## 2019-06-17 DIAGNOSIS — K219 Gastro-esophageal reflux disease without esophagitis: Secondary | ICD-10-CM | POA: Diagnosis present

## 2019-06-17 DIAGNOSIS — D62 Acute posthemorrhagic anemia: Secondary | ICD-10-CM | POA: Diagnosis present

## 2019-06-17 DIAGNOSIS — G8929 Other chronic pain: Secondary | ICD-10-CM | POA: Diagnosis present

## 2019-06-17 DIAGNOSIS — K859 Acute pancreatitis without necrosis or infection, unspecified: Secondary | ICD-10-CM | POA: Diagnosis present

## 2019-06-17 DIAGNOSIS — IMO0002 Reserved for concepts with insufficient information to code with codable children: Secondary | ICD-10-CM | POA: Diagnosis present

## 2019-06-17 DIAGNOSIS — Z885 Allergy status to narcotic agent status: Secondary | ICD-10-CM

## 2019-06-17 DIAGNOSIS — E875 Hyperkalemia: Secondary | ICD-10-CM | POA: Diagnosis present

## 2019-06-17 DIAGNOSIS — I132 Hypertensive heart and chronic kidney disease with heart failure and with stage 5 chronic kidney disease, or end stage renal disease: Secondary | ICD-10-CM | POA: Diagnosis not present

## 2019-06-17 DIAGNOSIS — I5042 Chronic combined systolic (congestive) and diastolic (congestive) heart failure: Secondary | ICD-10-CM | POA: Diagnosis present

## 2019-06-17 DIAGNOSIS — Z7901 Long term (current) use of anticoagulants: Secondary | ICD-10-CM | POA: Diagnosis not present

## 2019-06-17 DIAGNOSIS — I5022 Chronic systolic (congestive) heart failure: Secondary | ICD-10-CM

## 2019-06-17 DIAGNOSIS — N2581 Secondary hyperparathyroidism of renal origin: Secondary | ICD-10-CM | POA: Diagnosis not present

## 2019-06-17 DIAGNOSIS — Z86718 Personal history of other venous thrombosis and embolism: Secondary | ICD-10-CM

## 2019-06-17 DIAGNOSIS — K922 Gastrointestinal hemorrhage, unspecified: Secondary | ICD-10-CM

## 2019-06-17 DIAGNOSIS — Z886 Allergy status to analgesic agent status: Secondary | ICD-10-CM | POA: Diagnosis not present

## 2019-06-17 DIAGNOSIS — E8770 Fluid overload, unspecified: Secondary | ICD-10-CM | POA: Diagnosis not present

## 2019-06-17 DIAGNOSIS — I120 Hypertensive chronic kidney disease with stage 5 chronic kidney disease or end stage renal disease: Secondary | ICD-10-CM | POA: Diagnosis not present

## 2019-06-17 DIAGNOSIS — N185 Chronic kidney disease, stage 5: Secondary | ICD-10-CM | POA: Diagnosis not present

## 2019-06-17 DIAGNOSIS — I152 Hypertension secondary to endocrine disorders: Secondary | ICD-10-CM | POA: Diagnosis present

## 2019-06-17 DIAGNOSIS — R1013 Epigastric pain: Secondary | ICD-10-CM | POA: Diagnosis not present

## 2019-06-17 DIAGNOSIS — K861 Other chronic pancreatitis: Secondary | ICD-10-CM | POA: Diagnosis present

## 2019-06-17 DIAGNOSIS — I5020 Unspecified systolic (congestive) heart failure: Secondary | ICD-10-CM

## 2019-06-17 DIAGNOSIS — I5023 Acute on chronic systolic (congestive) heart failure: Secondary | ICD-10-CM | POA: Diagnosis present

## 2019-06-17 DIAGNOSIS — N189 Chronic kidney disease, unspecified: Secondary | ICD-10-CM | POA: Diagnosis present

## 2019-06-17 DIAGNOSIS — D631 Anemia in chronic kidney disease: Secondary | ICD-10-CM | POA: Diagnosis present

## 2019-06-17 DIAGNOSIS — T82898A Other specified complication of vascular prosthetic devices, implants and grafts, initial encounter: Secondary | ICD-10-CM | POA: Diagnosis not present

## 2019-06-17 DIAGNOSIS — I700 Atherosclerosis of aorta: Secondary | ICD-10-CM | POA: Diagnosis present

## 2019-06-17 DIAGNOSIS — E1159 Type 2 diabetes mellitus with other circulatory complications: Secondary | ICD-10-CM | POA: Diagnosis present

## 2019-06-17 DIAGNOSIS — Z03818 Encounter for observation for suspected exposure to other biological agents ruled out: Secondary | ICD-10-CM | POA: Diagnosis not present

## 2019-06-17 DIAGNOSIS — E1165 Type 2 diabetes mellitus with hyperglycemia: Secondary | ICD-10-CM

## 2019-06-17 HISTORY — DX: Gastrointestinal hemorrhage, unspecified: K92.2

## 2019-06-17 LAB — COMPREHENSIVE METABOLIC PANEL
ALT: 18 U/L (ref 0–44)
AST: 24 U/L (ref 15–41)
Albumin: 3.4 g/dL — ABNORMAL LOW (ref 3.5–5.0)
Alkaline Phosphatase: 240 U/L — ABNORMAL HIGH (ref 38–126)
Anion gap: 16 — ABNORMAL HIGH (ref 5–15)
BUN: 57 mg/dL — ABNORMAL HIGH (ref 6–20)
CO2: 24 mmol/L (ref 22–32)
Calcium: 7.6 mg/dL — ABNORMAL LOW (ref 8.9–10.3)
Chloride: 101 mmol/L (ref 98–111)
Creatinine, Ser: 11.98 mg/dL — ABNORMAL HIGH (ref 0.61–1.24)
GFR calc Af Amer: 5 mL/min — ABNORMAL LOW (ref >60)
GFR calc non Af Amer: 4 mL/min — ABNORMAL LOW (ref >60)
Glucose, Bld: 73 mg/dL (ref 70–99)
Potassium: 5.3 mmol/L — ABNORMAL HIGH (ref 3.5–5.1)
Sodium: 141 mmol/L (ref 135–145)
Total Bilirubin: 0.4 mg/dL (ref 0.3–1.2)
Total Protein: 7.7 g/dL (ref 6.5–8.1)

## 2019-06-17 LAB — CBC
HCT: 22.8 % — ABNORMAL LOW (ref 39.0–52.0)
Hemoglobin: 7 g/dL — ABNORMAL LOW (ref 13.0–17.0)
MCH: 28.6 pg (ref 26.0–34.0)
MCHC: 30.7 g/dL (ref 30.0–36.0)
MCV: 93.1 fL (ref 80.0–100.0)
Platelets: 54 10*3/uL — ABNORMAL LOW (ref 150–400)
RBC: 2.45 MIL/uL — ABNORMAL LOW (ref 4.22–5.81)
RDW: 16.3 % — ABNORMAL HIGH (ref 11.5–15.5)
WBC: 1.9 10*3/uL — ABNORMAL LOW (ref 4.0–10.5)
nRBC: 0 % (ref 0.0–0.2)

## 2019-06-17 LAB — POC OCCULT BLOOD, ED: Fecal Occult Bld: POSITIVE — AB

## 2019-06-17 LAB — LIPASE, BLOOD: Lipase: 148 U/L — ABNORMAL HIGH (ref 11–51)

## 2019-06-17 MED ORDER — ONDANSETRON 4 MG PO TBDP: 8.0000 mg | ORAL_TABLET | Freq: Three times a day (TID) | ORAL | Status: DC | PRN

## 2019-06-17 MED ORDER — ACETAMINOPHEN 650 MG RE SUPP: 650.0000 mg | Freq: Four times a day (QID) | RECTAL | Status: DC | PRN

## 2019-06-17 MED ORDER — ZOLPIDEM TARTRATE 5 MG PO TABS
5.0000 mg | ORAL_TABLET | Freq: Every evening | ORAL | Status: DC | PRN
  Filled 2019-06-17: qty 1

## 2019-06-17 MED ORDER — FLEET ENEMA 7-19 GM/118ML RE ENEM: 1.0000 | ENEMA | Freq: Once | RECTAL | Status: DC | PRN

## 2019-06-17 MED ORDER — ONDANSETRON HCL 4 MG/2ML IJ SOLN
4.0000 mg | Freq: Once | INTRAMUSCULAR | Status: AC
  Administered 2019-06-17: 4 mg via INTRAVENOUS
  Filled 2019-06-17: qty 2

## 2019-06-17 MED ORDER — SODIUM CHLORIDE 0.9% FLUSH: 3.0000 mL | INTRAVENOUS | Status: DC | PRN

## 2019-06-17 MED ORDER — RENA-VITE PO TABS
1.0000 | ORAL_TABLET | Freq: Every day | ORAL | Status: DC
  Administered 2019-06-18 – 2019-06-19 (×2): 1 via ORAL
  Filled 2019-06-17 (×2): qty 1

## 2019-06-17 MED ORDER — SODIUM CHLORIDE 0.9 % IV SOLN: 250.0000 mL | INTRAVENOUS | Status: DC | PRN

## 2019-06-17 MED ORDER — CAMPHOR-MENTHOL 0.5-0.5 % EX LOTN
1.0000 "application " | TOPICAL_LOTION | Freq: Three times a day (TID) | CUTANEOUS | Status: DC | PRN
  Filled 2019-06-17: qty 222

## 2019-06-17 MED ORDER — DOCUSATE SODIUM 283 MG RE ENEM
1.0000 | ENEMA | RECTAL | Status: DC | PRN
  Filled 2019-06-17: qty 1

## 2019-06-17 MED ORDER — ONDANSETRON HCL 4 MG PO TABS: 4.0000 mg | ORAL_TABLET | Freq: Four times a day (QID) | ORAL | Status: DC | PRN

## 2019-06-17 MED ORDER — SENNOSIDES-DOCUSATE SODIUM 8.6-50 MG PO TABS
2.0000 | ORAL_TABLET | Freq: Every day | ORAL | Status: DC
  Administered 2019-06-17: 2 via ORAL
  Filled 2019-06-17 (×2): qty 2

## 2019-06-17 MED ORDER — SODIUM CHLORIDE 0.9% FLUSH
3.0000 mL | Freq: Two times a day (BID) | INTRAVENOUS | Status: DC
  Administered 2019-06-17 – 2019-06-19 (×5): 3 mL via INTRAVENOUS

## 2019-06-17 MED ORDER — SODIUM THIOSULFATE 25 % IV SOLN
25.0000 g | INTRAVENOUS | Status: DC
  Filled 2019-06-17: qty 100

## 2019-06-17 MED ORDER — ONDANSETRON HCL 4 MG/2ML IJ SOLN
4.0000 mg | Freq: Four times a day (QID) | INTRAMUSCULAR | Status: DC | PRN
  Administered 2019-06-17 – 2019-06-19 (×5): 4 mg via INTRAVENOUS
  Filled 2019-06-17 (×6): qty 2

## 2019-06-17 MED ORDER — POLYETHYLENE GLYCOL 3350 17 G PO PACK: 17.0000 g | PACK | Freq: Every day | ORAL | Status: DC | PRN

## 2019-06-17 MED ORDER — HYDROMORPHONE HCL 1 MG/ML IJ SOLN
1.0000 mg | Freq: Once | INTRAMUSCULAR | Status: AC
  Administered 2019-06-17: 1 mg via INTRAVENOUS
  Filled 2019-06-17: qty 1

## 2019-06-17 MED ORDER — SODIUM CHLORIDE 0.9% FLUSH: 3.0000 mL | Freq: Once | INTRAVENOUS | Status: DC

## 2019-06-17 MED ORDER — NITROGLYCERIN 0.4 MG SL SUBL: 0.4000 mg | SUBLINGUAL_TABLET | SUBLINGUAL | Status: DC | PRN

## 2019-06-17 MED ORDER — ACETAMINOPHEN 325 MG PO TABS: 650.0000 mg | ORAL_TABLET | Freq: Four times a day (QID) | ORAL | Status: DC | PRN

## 2019-06-17 MED ORDER — PANTOPRAZOLE SODIUM 40 MG IV SOLR: 40.0000 mg | INTRAVENOUS | Status: DC

## 2019-06-17 MED ORDER — AMLODIPINE BESYLATE 10 MG PO TABS
10.0000 mg | ORAL_TABLET | Freq: Every day | ORAL | Status: DC
  Administered 2019-06-18 – 2019-06-19 (×2): 10 mg via ORAL
  Filled 2019-06-17 (×2): qty 1

## 2019-06-17 MED ORDER — CALCIUM CARBONATE ANTACID 1250 MG/5ML PO SUSP
500.0000 mg | Freq: Four times a day (QID) | ORAL | Status: DC | PRN
  Filled 2019-06-17: qty 5

## 2019-06-17 MED ORDER — NEPRO/CARBSTEADY PO LIQD
237.0000 mL | Freq: Three times a day (TID) | ORAL | Status: DC | PRN
  Filled 2019-06-17: qty 237

## 2019-06-17 MED ORDER — PANTOPRAZOLE SODIUM 40 MG IV SOLR
40.0000 mg | Freq: Two times a day (BID) | INTRAVENOUS | Status: DC
  Administered 2019-06-17 – 2019-06-19 (×4): 40 mg via INTRAVENOUS
  Filled 2019-06-17 (×5): qty 40

## 2019-06-17 MED ORDER — HYDRALAZINE HCL 50 MG PO TABS
100.0000 mg | ORAL_TABLET | Freq: Three times a day (TID) | ORAL | Status: DC
  Administered 2019-06-17 – 2019-06-19 (×7): 100 mg via ORAL
  Filled 2019-06-17 (×7): qty 2

## 2019-06-17 MED ORDER — CYCLOBENZAPRINE HCL 10 MG PO TABS
10.0000 mg | ORAL_TABLET | Freq: Three times a day (TID) | ORAL | Status: DC | PRN
  Filled 2019-06-17: qty 1

## 2019-06-17 MED ORDER — OXYCODONE HCL 5 MG PO TABS
15.0000 mg | ORAL_TABLET | ORAL | Status: DC | PRN
  Filled 2019-06-17: qty 3

## 2019-06-17 MED ORDER — LANTHANUM CARBONATE 500 MG PO CHEW
1000.0000 mg | CHEWABLE_TABLET | Freq: Three times a day (TID) | ORAL | Status: DC
  Administered 2019-06-18 – 2019-06-19 (×4): 1000 mg via ORAL
  Filled 2019-06-17 (×4): qty 2

## 2019-06-17 MED ORDER — DIPHENHYDRAMINE HCL 25 MG PO CAPS
25.0000 mg | ORAL_CAPSULE | Freq: Three times a day (TID) | ORAL | Status: DC | PRN
  Filled 2019-06-17: qty 1

## 2019-06-17 MED ORDER — KETOROLAC TROMETHAMINE 15 MG/ML IJ SOLN
15.0000 mg | Freq: Once | INTRAMUSCULAR | Status: AC
  Administered 2019-06-17: 15 mg via INTRAVENOUS
  Filled 2019-06-17: qty 1

## 2019-06-17 MED ORDER — SORBITOL 70 % SOLN: 30.0000 mL | Freq: Every day | Status: DC | PRN

## 2019-06-17 MED ORDER — PANTOPRAZOLE SODIUM 40 MG IV SOLR
40.0000 mg | Freq: Once | INTRAVENOUS | Status: AC
  Administered 2019-06-17: 40 mg via INTRAVENOUS
  Filled 2019-06-17: qty 40

## 2019-06-17 MED ORDER — HYDROMORPHONE HCL 1 MG/ML IJ SOLN
1.0000 mg | INTRAMUSCULAR | Status: DC | PRN
  Administered 2019-06-17 (×3): 2 mg via INTRAVENOUS
  Administered 2019-06-18 (×2): 1 mg via INTRAVENOUS
  Administered 2019-06-18 (×2): 2 mg via INTRAVENOUS
  Administered 2019-06-18 – 2019-06-19 (×3): 1 mg via INTRAVENOUS
  Administered 2019-06-19: 2 mg via INTRAVENOUS
  Administered 2019-06-19: 1 mg via INTRAVENOUS
  Administered 2019-06-19: 2 mg via INTRAVENOUS
  Administered 2019-06-19 – 2019-06-20 (×2): 1 mg via INTRAVENOUS
  Filled 2019-06-17: qty 2
  Filled 2019-06-17: qty 1
  Filled 2019-06-17 (×2): qty 2
  Filled 2019-06-17: qty 1
  Filled 2019-06-17 (×3): qty 2
  Filled 2019-06-17: qty 1
  Filled 2019-06-17: qty 2
  Filled 2019-06-17: qty 1
  Filled 2019-06-17: qty 2
  Filled 2019-06-17: qty 1
  Filled 2019-06-17: qty 2
  Filled 2019-06-17: qty 1

## 2019-06-17 MED ORDER — SODIUM POLYSTYRENE SULFONATE 15 GM/60ML PO SUSP
30.0000 g | Freq: Once | ORAL | Status: AC
  Administered 2019-06-17: 30 g via ORAL
  Filled 2019-06-17: qty 120

## 2019-06-17 MED ORDER — HYDROXYZINE HCL 25 MG PO TABS
25.0000 mg | ORAL_TABLET | Freq: Three times a day (TID) | ORAL | Status: DC | PRN
  Filled 2019-06-17: qty 1

## 2019-06-17 MED ORDER — CHLORHEXIDINE GLUCONATE CLOTH 2 % EX PADS: 6.0000 | MEDICATED_PAD | Freq: Every day | CUTANEOUS | Status: DC

## 2019-06-17 MED ORDER — SODIUM THIOSULFATE 25 % IV SOLN
25.0000 g | INTRAVENOUS | Status: AC
  Administered 2019-06-18: 25 g via INTRAVENOUS
  Filled 2019-06-17 (×2): qty 100

## 2019-06-17 NOTE — Progress Notes (Signed)
Pt has BIPAP QHS order but per policy, pt unable to wear until negative COVID test. Results still pending.
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
## 2019-06-17 NOTE — ED Notes (Signed)
Patient refused POC occult test. Patient stated "let me think about it".

## 2019-06-17 NOTE — Plan of Care (Signed)
  Problem: Education: Goal: Knowledge of General Education information will improve Description: Including pain rating scale, medication(s)/side effects and non-pharmacologic comfort measures Outcome: Progressing

## 2019-06-17 NOTE — Progress Notes (Signed)
NEW ADMISSION NOTE New Admission Note:   Arrival Method: EMS from [facility] Mental Orientation: alert and oriented x4  Telemetry: box: 08 NSR  Assessment: Completed Skin: intact dry  IV: right upper arm  Pain: 6- abdomen, right side  Safety Measures: Safety Fall Prevention Plan has been given, discussed and signed Admission: Completed [facility] [place] Orientation: Patient has been orientated to the room, unit and staff.  Family: NA  Orders have been reviewed and implemented. Will continue to monitor the patient. Call light has been placed within reach and bed alarm has been activated.   Baldo Ash, RN

## 2019-06-17 NOTE — ED Triage Notes (Signed)
Patient complaining of nausea and vomiting x 4 days. Patient has dialysis Tue/Thur/Sat.
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
## 2019-06-18 ENCOUNTER — Encounter (HOSPITAL_COMMUNITY): Payer: Self-pay | Admitting: Internal Medicine

## 2019-06-18 DIAGNOSIS — I160 Hypertensive urgency: Secondary | ICD-10-CM

## 2019-06-18 LAB — CBC WITH DIFFERENTIAL/PLATELET
Abs Immature Granulocytes: 0.02 10*3/uL (ref 0.00–0.07)
Abs Immature Granulocytes: 0.03 10*3/uL (ref 0.00–0.07)
Basophils Absolute: 0 10*3/uL (ref 0.0–0.1)
Basophils Absolute: 0 10*3/uL (ref 0.0–0.1)
Basophils Relative: 0 %
Basophils Relative: 1 %
Eosinophils Absolute: 0.3 10*3/uL (ref 0.0–0.5)
Eosinophils Absolute: 0.2 10*3/uL (ref 0.0–0.5)
Eosinophils Relative: 6 %
Eosinophils Relative: 3 %
HCT: 30.6 % — ABNORMAL LOW (ref 39.0–52.0)
HCT: 32.3 % — ABNORMAL LOW (ref 39.0–52.0)
Hemoglobin: 9.6 g/dL — ABNORMAL LOW (ref 13.0–17.0)
Hemoglobin: 10.4 g/dL — ABNORMAL LOW (ref 13.0–17.0)
Immature Granulocytes: 0 %
Immature Granulocytes: 1 %
Lymphocytes Relative: 11 %
Lymphocytes Relative: 6 %
Lymphs Abs: 0.6 10*3/uL — ABNORMAL LOW (ref 0.7–4.0)
Lymphs Abs: 0.4 10*3/uL — ABNORMAL LOW (ref 0.7–4.0)
MCH: 28 pg (ref 26.0–34.0)
MCH: 28.3 pg (ref 26.0–34.0)
MCHC: 31.4 g/dL (ref 30.0–36.0)
MCHC: 32.2 g/dL (ref 30.0–36.0)
MCV: 89.2 fL (ref 80.0–100.0)
MCV: 87.8 fL (ref 80.0–100.0)
Monocytes Absolute: 0.6 10*3/uL (ref 0.1–1.0)
Monocytes Absolute: 0.7 10*3/uL (ref 0.1–1.0)
Monocytes Relative: 12 %
Monocytes Relative: 10 %
Neutro Abs: 3.6 10*3/uL (ref 1.7–7.7)
Neutro Abs: 5 10*3/uL (ref 1.7–7.7)
Neutrophils Relative %: 71 %
Neutrophils Relative %: 79 %
Platelets: 154 10*3/uL (ref 150–400)
Platelets: 153 10*3/uL (ref 150–400)
RBC: 3.43 MIL/uL — ABNORMAL LOW (ref 4.22–5.81)
RBC: 3.68 MIL/uL — ABNORMAL LOW (ref 4.22–5.81)
RDW: 16.2 % — ABNORMAL HIGH (ref 11.5–15.5)
RDW: 16.2 % — ABNORMAL HIGH (ref 11.5–15.5)
WBC: 5.2 10*3/uL (ref 4.0–10.5)
WBC: 6.3 10*3/uL (ref 4.0–10.5)
nRBC: 0 % (ref 0.0–0.2)
nRBC: 0 % (ref 0.0–0.2)

## 2019-06-18 LAB — PHOSPHORUS: Phosphorus: 6.7 mg/dL — ABNORMAL HIGH (ref 2.5–4.6)

## 2019-06-18 LAB — COMPREHENSIVE METABOLIC PANEL
ALT: 17 U/L (ref 0–44)
AST: 19 U/L (ref 15–41)
Albumin: 3.2 g/dL — ABNORMAL LOW (ref 3.5–5.0)
Alkaline Phosphatase: 211 U/L — ABNORMAL HIGH (ref 38–126)
Anion gap: 16 — ABNORMAL HIGH (ref 5–15)
BUN: 70 mg/dL — ABNORMAL HIGH (ref 6–20)
CO2: 24 mmol/L (ref 22–32)
Calcium: 7.6 mg/dL — ABNORMAL LOW (ref 8.9–10.3)
Chloride: 101 mmol/L (ref 98–111)
Creatinine, Ser: 14.08 mg/dL — ABNORMAL HIGH (ref 0.61–1.24)
GFR calc Af Amer: 4 mL/min — ABNORMAL LOW (ref >60)
GFR calc non Af Amer: 3 mL/min — ABNORMAL LOW (ref >60)
Glucose, Bld: 85 mg/dL (ref 70–99)
Potassium: 5.6 mmol/L — ABNORMAL HIGH (ref 3.5–5.1)
Sodium: 141 mmol/L (ref 135–145)
Total Bilirubin: 1 mg/dL (ref 0.3–1.2)
Total Protein: 7.4 g/dL (ref 6.5–8.1)

## 2019-06-18 LAB — GLUCOSE, CAPILLARY
Glucose-Capillary: 92 mg/dL (ref 70–99)
Glucose-Capillary: 79 mg/dL (ref 70–99)
Glucose-Capillary: 66 mg/dL — ABNORMAL LOW (ref 70–99)
Glucose-Capillary: 63 mg/dL — ABNORMAL LOW (ref 70–99)
Glucose-Capillary: 89 mg/dL (ref 70–99)

## 2019-06-18 LAB — SARS CORONAVIRUS 2 (TAT 6-24 HRS): SARS Coronavirus 2: NEGATIVE

## 2019-06-18 LAB — LIPASE, BLOOD: Lipase: 36 U/L (ref 11–51)

## 2019-06-18 LAB — PREPARE RBC (CROSSMATCH)

## 2019-06-18 MED ORDER — SODIUM CHLORIDE 0.9% IV SOLUTION: Freq: Once | INTRAVENOUS | Status: DC

## 2019-06-18 MED ORDER — HYDROMORPHONE HCL 1 MG/ML IJ SOLN
INTRAMUSCULAR | Status: AC
  Filled 2019-06-18: qty 1

## 2019-06-18 MED ORDER — CHLORHEXIDINE GLUCONATE CLOTH 2 % EX PADS: 6.0000 | MEDICATED_PAD | Freq: Every day | CUTANEOUS | Status: DC

## 2019-06-18 MED ORDER — ONDANSETRON HCL 4 MG/2ML IJ SOLN
INTRAMUSCULAR | Status: AC
  Administered 2019-06-18: 4 mg via INTRAVENOUS
  Filled 2019-06-18: qty 2

## 2019-06-18 MED ORDER — HEPARIN SODIUM (PORCINE) 1000 UNIT/ML DIALYSIS: 2000.0000 [IU] | Freq: Once | INTRAMUSCULAR | Status: DC

## 2019-06-18 NOTE — Progress Notes (Signed)
OT Cancellation Note  Patient Details Name: Frank [name] MRN: [id number] DOB: 1966-06-26   Cancelled Treatment:    Reason Eval/Treat Not Completed: Patient declined, no reason specified(Pt dry heaving with earlier episodes of N/V.)  OT to continue to follow-up with pt for OT eval as schedule allows. Pt motivated to work with OTR, but after multiple attempts, pt continues to not feel well.  Ebony Hail Harold Hedge) Marsa Aris OTR/L Acute Rehabilitation Services Pager: [phone] Office: [place] 06/18/2019, 4:33 PM

## 2019-06-18 NOTE — Progress Notes (Signed)
PT Cancellation Note  Patient Details Name: Frank [name] MRN: [id number] DOB: 07/26/1965   Cancelled Treatment:    Reason Eval/Treat Not Completed: Patient declined, no reason specified.  Pt was very clear he is not going to do PT today, but did agree to OT earlier.  Will try again at another time.   Ramond Dial 06/18/2019, 2:31 PM   Mee Hives, PT MS Acute Rehab Dept. Number: [place] and [place]

## 2019-06-18 NOTE — Progress Notes (Addendum)
[facility] Progress Note   Subjective:   Patient seen on HD. Feeling better this AM. Reports feeling better this AM. Ongoing abdominal pain and nausea but improved. Denies SOB, cough, CP, headache, and dizziness.   Objective Vitals:   06/18/19 1030 06/18/19 1100 06/18/19 1107 06/18/19 1221  BP: (!) 202/77 (!) 211/99 (!) 221/94 (!) 246/156  Pulse: 80 89 94 90  Resp: _0 Temp:   98.8 F (37.1 C) 98.4 F (36.9 C)  TempSrc:   Oral Oral  SpO2:   96% 96%  Weight:      Height:       Physical Exam General: Well developed, well nourished male. Alert and in NAD Heart: RRR, no murmurs, rubs or gallops Lungs: CTA b/l without wheezing, rhonchi or rales Abdomen: Soft, non-tender, non-distended. +BS Extremities: No edema b/l lower extremities Dialysis Access: LUE AVF cannulated  Additional Objective Labs: Basic Metabolic Panel: Recent Labs  Lab 06/17/19 0718 06/18/19 0600  NA 141 141  K 5.3* 5.6*  CL 101 101  CO2 24 24  GLUCOSE 73 85  BUN 57* 70*  CREATININE 11.98* 14.08*  CALCIUM 7.6* 7.6*  PHOS  --  6.7*   Liver Function Tests: Recent Labs  Lab 06/17/19 0718 06/18/19 0600  AST 24 19  ALT 18 17  ALKPHOS 240* 211*  BILITOT 0.4 1.0  PROT 7.7 7.4  ALBUMIN 3.4* 3.2*   Recent Labs  Lab 06/17/19 0718 06/18/19 0600  LIPASE 148* 36   CBC: Recent Labs  Lab 06/17/19 0718 06/18/19 0600  WBC 1.9* 5.2  NEUTROABS  --  3.6  HGB 7.0* 9.6*  HCT 22.8* 30.6*  MCV 93.1 89.2  PLT 54* 154   Blood Culture    Component Value Date/Time   SDES PLEURAL 07/29/2018 1300   SDES PLEURAL 07/29/2018 1300   SPECREQUEST NONE 07/29/2018 1300   SPECREQUEST NONE 07/29/2018 1300   CULT  07/29/2018 1300    NO GROWTH 5 DAYS Performed at [facility], [place] [address] [place], [place] [address]    REPTSTATUS 08/03/2018 FINAL 07/29/2018 1300   REPTSTATUS 07/29/2018 FINAL 07/29/2018 1300    CBG: Recent Labs  Lab 06/18/19 0219 06/18/19 1146  GLUCAP  92 79    Studies/Results: Ct Abdomen Pelvis Wo Contrast  Result Date: 06/17/2019 CLINICAL DATA:  Patient presenting with 3 to 4-day history of worsening epigastric abdominal pain worse than his chronic pain per patient, with associated 4 to 5-day history of nausea and vomiting with dark emesis and some reddish emesis per patient. EXAM: CT ABDOMEN AND PELVIS WITHOUT CONTRAST TECHNIQUE: Multidetector CT imaging of the abdomen and pelvis was performed following the standard protocol without IV contrast. COMPARISON:  CT, 05/06/2019 and older exams. FINDINGS: Lower chest: Chronic right lung base pleuroparenchymal scarring and rounded atelectasis, without significant change from the prior CT. Stable mild-to-moderate cardiomegaly. Stable trace pericardial effusion. No acute findings. Hepatobiliary: No focal liver abnormality is seen. No gallstones, gallbladder wall thickening, or biliary dilatation. Pancreas: Unremarkable. No pancreatic ductal dilatation or surrounding inflammatory changes. Spleen: Normal in size without focal abnormality. Adrenals/Urinary Tract: Marked renal atrophy. Homogeneous hyperattenuating 3.3 cm left renal mass consistent a hemorrhagic cyst, stable from prior CT. Smaller adjacent hypoattenuating left renal mass consistent with a simple cyst, also unchanged. No hydronephrosis. Normal ureters. Bladder is mostly decompressed but otherwise unremarkable. Soft tissue with dystrophic calcifications in the left iliac fossa is stable reflecting failed transplant kidney. Stomach/Bowel: Stomach mostly decompressed. No evidence  of wall thickening. No adjacent inflammation. Small bowel and colon are normal in caliber. No wall thickening or inflammation. No evidence of appendicitis. Vascular/Lymphatic: Dense aortic atherosclerotic calcifications. No aneurysm. No enlarged lymph nodes. Reproductive: Unremarkable. Other: No abdominal wall hernia.  No ascites. Musculoskeletal: Skeletal changes of renal  osteodystrophy. No fracture. No bone lesion. IMPRESSION: 1. No acute findings within the abdomen or pelvis. No findings to account for the patient's symptoms. 2. Multiple chronic findings as detailed above, which are stable from CT. Electronically Signed   By: Lajean Manes M.D.   On: 06/17/2019 13:34   Medications: . sodium chloride    . [START ON 06/19/2019] sodium thiosulfate infusion for calciphylaxis     . sodium chloride   Intravenous Once  . amLODipine  10 mg Oral Daily  . Chlorhexidine Gluconate Cloth  6 each Topical Q0600  . hydrALAZINE  100 mg Oral TID  . HYDROmorphone      . lanthanum  1,000 mg Oral TID WC  . multivitamin  1 tablet Oral Daily  . pantoprazole (PROTONIX) IV  40 mg Intravenous Q12H  . senna-docusate  2 tablet Oral QHS  . sodium chloride flush  3 mL Intravenous Once  . sodium chloride flush  3 mL Intravenous Q12H    Dialysis Orders: [facility] 4h 400/800 2K/2.25Ca L AVF No heparin  EDW 71.5kg -Na thio 25g TIW  Assessment/Plan: 1. Abd pain/ N/V/ hx of recur pancreatitis - per primary 2. Bleeding AVF - skin is thinning over AVF, consulted VVS for evaluation. 3. ESRD -HD TTS.Missing HD due to abd pain signing off early per the patient. Tolerating HD so far today. Will resume TTS schedule with HD tomorrow.  4. Anemia ckd / abl - Hb dropped here to 7.0 but up to 9.6 this AM without intervention. Unclear if worsening anemia is due to GI bleed, bleeding AVF, or combo. Follow.  5. Hyperkalemia - Received kayexalate on admission, K+ 5.6 this AM. Now on HD- expect resolution with HD.  6. Hypertension/volume -Over EDW by 7.8kg this AM. No resp issues but BP is also elevated. On home meds amlodipine and hydralazine. Follow post-HD, ma require an additional agent. Further UF with HD tomorrow.  7. Metabolic bone disease -Hx calciphylaxis. No Ca/VDRA. Gets Na thio w HD. Parsabiv not available in hospital. Fosrenol binder when eating.  Anice Paganini,  PA-C 06/18/2019, 12:37 PM  [facility] Pager: [phone]  Pt seen, examined and agree w A/P as above. Kelly Splinter  MD 06/18/2019, 2:58 PM

## 2019-06-18 NOTE — Progress Notes (Signed)
[facility]enterology Progress Note  Frank [name] 53 y.o. Jan 14, 1966   Subjective: Sitting up in bed. Denies any further melena. Reports recurrent nausea and vomiting of nonbloody emesis. Ongoing abdominal pain. Dialysis this morning.  Objective: Vital signs: Vitals:   06/18/19 1107 06/18/19 1221  BP: (!) 221/94 (!) 246/156  Pulse: 94 90  Resp: 20 18  Temp: 98.8 F (37.1 C) 98.4 F (36.9 C)  SpO2: 96% 96%    Physical Exam: Gen: alert, no acute distress  HEENT: anicteric sclera CV: RRR Chest: CTA B Abd: diffuse tenderness with guarding, soft, nondistended, +BS Ext: no edema  Lab Results: Recent Labs    06/17/19 0718 06/18/19 0600  NA 141 141  K 5.3* 5.6*  CL 101 101  CO2 24 24  GLUCOSE 73 85  BUN 57* 70*  CREATININE 11.98* 14.08*  CALCIUM 7.6* 7.6*  PHOS  --  6.7*   Recent Labs    06/17/19 0718 06/18/19 0600  AST 24 19  ALT 18 17  ALKPHOS 240* 211*  BILITOT 0.4 1.0  PROT 7.7 7.4  ALBUMIN 3.4* 3.2*   Recent Labs    06/17/19 0718 06/18/19 0600  WBC 1.9* 5.2  NEUTROABS  --  3.6  HGB 7.0* 9.6*  HCT 22.8* 30.6*  MCV 93.1 89.2  PLT 54* 154      Assessment/Plan: 53 yo with chronic pancreatitis with reported cyst-gastrostomy at [facility] who had melena prior to admit that has resolved. Hgb 9.6 without any blood transfusions after initial level of 7. Suspect lab error on yesterday's CBC because plts and WBC all low as well and CBC today WBC, plts within normal limits. Repeat CBC already ordered. EGD 2 weeks ago normal. Lipase normal today. May have acute on chronic pancreatitis with N/V and would manage with antiemetics prn and slowly advance diet as tolerated. No other recs. Dr. Watt Climes available to see this weekend if needed otherwise [facility] will sign off. Call us back if questions. Needs to f/u with [facility] for his chronic pancreatitis.    Frank [name] 06/18/2019, 2:08 PM  Questions please call [phone] ID: Frank [name], male   DOB:  01/26/1966, 53 y.o.   MRN: [id number]

## 2019-06-18 NOTE — Progress Notes (Signed)
OT Cancellation Note  Patient Details Name: Frank [name] MRN: [id number] DOB: 08/17/1965   Cancelled Treatment:    Reason Eval/Treat Not Completed: Patient declined, no reason specified(pt N/V and not feeling well. RN in room attending to pt.)  OT to continue to follow for next available treatment day.  Ebony Hail Harold Hedge) Marsa Aris OTR/L Acute Rehabilitation Services Pager: [phone] Office: [place] 06/18/2019, 1:12 PM
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
## 2019-06-18 NOTE — Plan of Care (Signed)
  Problem: Health Behavior/Discharge Planning: Goal: Ability to manage health-related needs will improve Outcome: Progressing   Problem: Clinical Measurements: Goal: Diagnostic test results will improve Outcome: Progressing   Problem: Pain Managment: Goal: General experience of comfort will improve Outcome: Progressing

## 2019-06-18 NOTE — Progress Notes (Addendum)
Progress Note    Frank [name]  KCM:[id number] DOB: 1965/08/09  DOA: 06/17/2019 PCP: Patient, [name]      Brief Narrative:    Medical records reviewed and are as summarized below:  Frank [name] is an 53 y.o. male with medical history significant of end-stage renal disease on hemodialysis x24 years (Tuesday/Thursday/Saturday at [facility] chronic abdominal pain secondary to chronic pancreatitis, hypertension, chronic combined systolic and diastolic heart failure, nonischemic cardiomyopathy, history of DVT and PE on chronic anticoagulation with Eliquis, hyperkalemia, gastroesophageal reflux disease, history of left renal mass, dyslipidemia, complex sleep apnea syndrome on BiPAP nightly, with multiple history of recurrent ED visits for abdominal pain over the past 6 months.  He presented to the hospital with abdominal pain, vomiting and black stools.    Assessment/Plan:   Principal Problem:   GI bleed Active Problems:   Anemia of chronic kidney failure   History of pulmonary embolism   DM (diabetes mellitus), type 2, uncontrolled, with renal complications (HCC)   Dyslipidemia   Essential hypertension   Chronic systolic heart failure (HCC)   Nonischemic cardiomyopathy (HCC)   Chronic pain   GERD (gastroesophageal reflux disease)   History of DVT (deep vein thrombosis)   ESRD needing dialysis (HCC)   Benign hypertensive heart and kidney disease with systolic CHF, NYHA class 3 and CKD stage 5 ([place])   End-stage renal disease on hemodialysis ([place])   PE (pulmonary thromboembolism) (HCC)   Chronic pancreatitis (HCC)   Acute on chronic pancreatitis ([place])   Hypertensive urgency   Acute blood loss anemia   Body mass index is 22.45 kg/m.    Acute blood loss anemia: Hemoglobin was 7.0 on admission which was down from 10.4 on 06/09/2019.  However, hemoglobin today was up to 9.6 even without any blood transfusion.  Repeat H&H.  Recent melena/GI bleed: Patient was  evaluated by gastroenterologist and has no plan for any endoscopic work-up at this time.  He had EGD 2 years ago which was unremarkable.  Continue IV Protonix.  Hypertensive urgency: Continue antihypertensives  Acute on chronic pancreatitis/abdominal pain and vomiting: Advance diet as tolerated.  Analgesics as needed for pain.  ESRD on hemodialysis: Hyperkalemia resolved.  Follow-up with nephrologist for hemodialysis.  History of type 2 diabetes mellitus with mild hypoglycemia: Asymptomatic.  Monitor glucose levels closely.  Chronic systolic CHF with EF of 35 to 40% on 2D echo in April 2019: Hemodialysis for volume management.  History of PE and DVT: Eliquis on hold for now given concern for recent melena  Leukopenia and thrombocytopenia: Resolved.  It was the same specimen that showed hemoglobin of 7.  This may have been a spurious result.    Family Communication/Anticipated D/C date and plan/Code Status   DVT prophylaxis: SCDs Code Status: Full code Family Communication: Plan discussed with the patient Disposition Plan: Home in 1 to 2 days depending on clinical improvement      Subjective:   He complains of vomiting (twice this morning) and right-sided abdominal pain.  Vomitus was yellowish in color and also no blood in the vomitus.  No bloody stools since admission.  Of note, he said he had bloody stools prior to admission but this appears to have resolved.  Abdominal pain is moderate in severity and it improved with analgesics.  He was seen at the hemodialysis unit this morning.  Objective:    Vitals:   06/18/19 0733 06/18/19 0800 06/18/19 0830 06/18/19 0900  BP: [name])  186/119 (!) 191/87 (!) 206/90 (!) 201/90  Pulse: 76 76 76 79  Resp: _0 Temp:      TempSrc:      SpO2:      Weight:      Height:        Intake/Output Summary (Last 24 hours) at 06/18/2019 0947 Last data filed at 06/18/2019 0547 Gross per 24 hour  Intake 720 ml  Output 0 ml  Net 720 ml    Filed Weights   06/17/19 0540 06/18/19 0527 06/18/19 0722  Weight: 77.1 kg 79.5 kg 79.3 kg    Exam:  GEN: NAD SKIN: No rash. EYES: EOMI ENT: MMM CV: RRR PULM: CTA B ABD: soft, ND, right upper quadrant, right flank and epigastric tenderness , no rebound tenderness or guarding, +BS CNS: AAO x 3, non focal EXT: Left forearm AV fistula with thrill and bruit.  No bleeding noted from AV fistula site.  No edema or tenderness   Data Reviewed:   I have personally reviewed following labs and imaging studies:  Labs: Labs show the following:   Basic Metabolic Panel: Recent Labs  Lab 06/17/19 0718 06/18/19 0600  NA 141 141  K 5.3* 5.6*  CL 101 101  CO2 24 24  GLUCOSE 73 85  BUN 57* 70*  CREATININE 11.98* 14.08*  CALCIUM 7.6* 7.6*  PHOS  --  6.7*   GFR Estimated Creatinine Clearance: 6.6 mL/min (A) (by C-G formula based on SCr of 14.08 mg/dL (H)). Liver Function Tests: Recent Labs  Lab 06/17/19 0718 06/18/19 0600  AST 24 19  ALT 18 17  ALKPHOS 240* 211*  BILITOT 0.4 1.0  PROT 7.7 7.4  ALBUMIN 3.4* 3.2*   Recent Labs  Lab 06/17/19 0718 06/18/19 0600  LIPASE 148* 36   No results for input(s): AMMONIA in the last 168 hours. Coagulation profile No results for input(s): INR, PROTIME in the last 168 hours.  CBC: Recent Labs  Lab 06/17/19 0718 06/18/19 0600  WBC 1.9* 5.2  NEUTROABS  --  3.6  HGB 7.0* 9.6*  HCT 22.8* 30.6*  MCV 93.1 89.2  PLT 54* 154   Cardiac Enzymes: No results for input(s): CKTOTAL, CKMB, CKMBINDEX, TROPONINI in the last 168 hours. BNP (last 3 results) No results for input(s): PROBNP in the last 8760 hours. CBG: Recent Labs  Lab 06/18/19 0219  GLUCAP 92   D-Dimer: No results for input(s): DDIMER in the last 72 hours. Hgb A1c: No results for input(s): HGBA1C in the last 72 hours. Lipid Profile: No results for input(s): CHOL, HDL, LDLCALC, TRIG, CHOLHDL, LDLDIRECT in the last 72 hours. Thyroid function studies: No results for  input(s): TSH, T4TOTAL, T3FREE, THYROIDAB in the last 72 hours.  Invalid input(s): FREET3 Anemia work up: No results for input(s): VITAMINB12, FOLATE, FERRITIN, TIBC, IRON, RETICCTPCT in the last 72 hours. Sepsis Labs: Recent Labs  Lab 06/17/19 0718 06/18/19 0600  WBC 1.9* 5.2    Microbiology Recent Results (from the past 240 hour(s))  SARS CORONAVIRUS 2 (TAT 6-24 HRS) Nasopharyngeal Nasopharyngeal Swab     Status: None   Collection Time: 06/17/19  9:56 AM   Specimen: Nasopharyngeal Swab  Result Value Ref Range Status   SARS Coronavirus 2 NEGATIVE NEGATIVE Final    Comment: (NOTE) SARS-CoV-2 target nucleic acids are NOT DETECTED. The SARS-CoV-2 RNA is generally detectable in upper and lower respiratory specimens during the acute phase of infection. Negative results do not preclude SARS-CoV-2 infection, do not rule out  co-infections with other pathogens, and should not be used as the sole basis for treatment or other patient management decisions. Negative results must be combined with clinical observations, patient history, and epidemiological information. The expected result is Negative. Fact Sheet for Patients: SugarRoll.be Fact Sheet for Healthcare Providers: https://www.woods-mathews.com/ This test is not yet approved or cleared by the [place] FDA and  has been authorized for detection and/or diagnosis of SARS-CoV-2 by FDA under an Emergency Use Authorization (EUA). This EUA will remain  in effect (meaning this test can be used) for the duration of the COVID-19 declaration under Section 56 4(b)(1) of the Act, 21 U.S.C. section 360bbb-3(b)(1), unless the authorization is terminated or revoked sooner. Performed at [facility], [place] [address], [place], [place] [address]     Procedures and diagnostic studies:  Ct Abdomen Pelvis Wo Contrast  Result Date: 06/17/2019 CLINICAL DATA:  Patient presenting with 3 to 4-day  history of worsening epigastric abdominal pain worse than his chronic pain per patient, with associated 4 to 5-day history of nausea and vomiting with dark emesis and some reddish emesis per patient. EXAM: CT ABDOMEN AND PELVIS WITHOUT CONTRAST TECHNIQUE: Multidetector CT imaging of the abdomen and pelvis was performed following the standard protocol without IV contrast. COMPARISON:  CT, 05/06/2019 and older exams. FINDINGS: Lower chest: Chronic right lung base pleuroparenchymal scarring and rounded atelectasis, without significant change from the prior CT. Stable mild-to-moderate cardiomegaly. Stable trace pericardial effusion. No acute findings. Hepatobiliary: No focal liver abnormality is seen. No gallstones, gallbladder wall thickening, or biliary dilatation. Pancreas: Unremarkable. No pancreatic ductal dilatation or surrounding inflammatory changes. Spleen: Normal in size without focal abnormality. Adrenals/Urinary Tract: Marked renal atrophy. Homogeneous hyperattenuating 3.3 cm left renal mass consistent a hemorrhagic cyst, stable from prior CT. Smaller adjacent hypoattenuating left renal mass consistent with a simple cyst, also unchanged. No hydronephrosis. Normal ureters. Bladder is mostly decompressed but otherwise unremarkable. Soft tissue with dystrophic calcifications in the left iliac fossa is stable reflecting failed transplant kidney. Stomach/Bowel: Stomach mostly decompressed. No evidence of wall thickening. No adjacent inflammation. Small bowel and colon are normal in caliber. No wall thickening or inflammation. No evidence of appendicitis. Vascular/Lymphatic: Dense aortic atherosclerotic calcifications. No aneurysm. No enlarged lymph nodes. Reproductive: Unremarkable. Other: No abdominal wall hernia.  No ascites. Musculoskeletal: Skeletal changes of renal osteodystrophy. No fracture. No bone lesion. IMPRESSION: 1. No acute findings within the abdomen or pelvis. No findings to account for the  patient's symptoms. 2. Multiple chronic findings as detailed above, which are stable from CT. Electronically Signed   By: Lajean Manes M.D.   On: 06/17/2019 13:34    Medications:   . sodium chloride   Intravenous Once  . amLODipine  10 mg Oral Daily  . Chlorhexidine Gluconate Cloth  6 each Topical Q0600  . [START ON 06/19/2019] heparin  2,000 Units Dialysis Once in dialysis  . hydrALAZINE  100 mg Oral TID  . HYDROmorphone      . lanthanum  1,000 mg Oral TID WC  . multivitamin  1 tablet Oral Daily  . pantoprazole (PROTONIX) IV  40 mg Intravenous Q12H  . senna-docusate  2 tablet Oral QHS  . sodium chloride flush  3 mL Intravenous Once  . sodium chloride flush  3 mL Intravenous Q12H   Continuous Infusions: . sodium chloride    . sodium thiosulfate infusion for calciphylaxis    . [START ON 06/19/2019] sodium thiosulfate infusion for calciphylaxis  LOS: 1 day   Frank [name]  [facility]   *Please refer to [email or web address], password [email or web address] to get updated schedule on who will round on this patient, as hospitalists switch teams weekly. If 7PM-7AM, please contact night-coverage at [email or web address], password [email or web address] for any overnight needs.  06/18/2019, 9:47 AM

## 2019-06-19 LAB — GLUCOSE, CAPILLARY: Glucose-Capillary: 79 mg/dL (ref 70–99)

## 2019-06-19 MED ORDER — PROCHLORPERAZINE EDISYLATE 10 MG/2ML IJ SOLN
10.0000 mg | Freq: Four times a day (QID) | INTRAMUSCULAR | Status: DC | PRN
Start: 2019-06-19 — End: 2019-06-20
  Administered 2019-06-19 (×3): 10 mg via INTRAVENOUS
  Filled 2019-06-19 (×3): qty 2

## 2019-06-19 NOTE — Progress Notes (Signed)
RN tried calling [facility] to get an update on when pt will go to dialysis, but unable to get a hold of anyone. Will try back in a few minutes.   Eleanora Neighbor, RN

## 2019-06-19 NOTE — Progress Notes (Signed)
Patient has refused BIPAP HS tonight. Patient in no distress at this time.

## 2019-06-19 NOTE — Plan of Care (Signed)
  Problem: Nutrition: Goal: Adequate nutrition will be maintained Outcome: Progressing

## 2019-06-19 NOTE — Progress Notes (Signed)
Progress Note    Frank [name]  DUP:[id number] DOB: 1965/09/28  DOA: 06/17/2019 PCP: Patient, [name]      Brief Narrative:    Medical records reviewed and are as summarized below:  Frank [name] is an 53 y.o. male with medical history significant of end-stage renal disease on hemodialysis x24 years (Tuesday/Thursday/Saturday at [facility] chronic abdominal pain secondary to chronic pancreatitis, hypertension, chronic combined systolic and diastolic heart failure, nonischemic cardiomyopathy, history of DVT and PE on chronic anticoagulation with Eliquis, hyperkalemia, gastroesophageal reflux disease, history of left renal mass, dyslipidemia, complex sleep apnea syndrome on BiPAP nightly, with multiple history of recurrent ED visits for abdominal pain over the past 6 months.  He presented to the hospital with abdominal pain, vomiting and black stools.    Assessment/Plan:   Principal Problem:   GI bleed Active Problems:   Anemia of chronic kidney failure   History of pulmonary embolism   DM (diabetes mellitus), type 2, uncontrolled, with renal complications (HCC)   Dyslipidemia   Essential hypertension   Chronic systolic heart failure (HCC)   Nonischemic cardiomyopathy (HCC)   Chronic pain   GERD (gastroesophageal reflux disease)   History of DVT (deep vein thrombosis)   ESRD needing dialysis (HCC)   Benign hypertensive heart and kidney disease with systolic CHF, NYHA class 3 and CKD stage 5 ([place])   End-stage renal disease on hemodialysis ([place])   PE (pulmonary thromboembolism) (HCC)   Chronic pancreatitis (HCC)   Acute on chronic pancreatitis ([place])   Hypertensive urgency   Acute blood loss anemia   Body mass index is 21.54 kg/m.    Acute blood loss anemia: Hemoglobin is stable.  Recent melena/GI bleed: Patient was evaluated by gastroenterologist and there was no plan for any endoscopic work-up at this time.  He had EGD 2 years ago which was  unremarkable.  Gastroenterologist has signed off.  Continue IV Protonix.  Hypertensive urgency: Continue antihypertensives  Acute on chronic pancreatitis/abdominal pain and vomiting: Advance diet as tolerated.  Analgesics as needed for pain. Compazine prn for nausea/vomiting. He doesn't want zofran or phenergan.  ESRD on hemodialysis/hyperkalemia: Follow-up with nephrologist for hemodialysis.  History of type 2 diabetes mellitus with mild hypoglycemia: Asymptomatic.  Monitor glucose levels closely.  Chronic systolic CHF with EF of 35 to 40% on 2D echo in April 2019: Hemodialysis for volume management.  History of PE and DVT: Eliquis on hold for now given concern for recent melena  Probable superiors leukopenia and thrombocytopenia: Resolved.  WBC and platelet counts have normalized.  Family Communication/Anticipated D/C date and plan/Code Status   DVT prophylaxis: SCDs Code Status: Full code Family Communication: Plan discussed with the patient Disposition Plan: Home in 1 to 2 days depending on clinical improvement      Subjective:   C/o nausea and vomiting.  He could not work with PT today because of significant nausea.  He said he did not want to take Zofran anymore.  He said he has been told to take Compazine for his nausea and vomiting.  Objective:    Vitals:   06/18/19 1550 06/18/19 2052 06/19/19 0100 06/19/19 0541  BP: (!) 192/96 (!) 164/101  (!) 198/90  Pulse: 87 80 85 85  Resp: _0 Temp:  98.6 F (37 C)  98 F (36.7 C)  TempSrc:  Oral  Oral  SpO2:  100% 100% 96%  Weight:    76.1 kg  Height:  Intake/Output Summary (Last 24 hours) at 06/19/2019 1445 Last data filed at 06/19/2019 0803 Gross per 24 hour  Intake 840 ml  Output 0 ml  Net 840 ml   Filed Weights   06/18/19 0527 06/18/19 0722 06/19/19 0541  Weight: 79.5 kg 79.3 kg 76.1 kg    Exam:  GEN: NAD SKIN: No rash. There is a healing wound on anterior aspect of proximal right forearm  (from calciphylaxis).  Left forearm AV fistula with thrill and bruit.  No bleeding noted from AV fistula site. EYES: No pallor or icterus ENT: MMM CV: RRR PULM: CTA B ABD: soft, ND, mild tenderness of right upper quadrant and right flank area, no rebound tenderness or guarding, +BS CNS: AAO x 3, non focal EXT: No edema or tenderness        Data Reviewed:   I have personally reviewed following labs and imaging studies:  Labs: Labs show the following:   Basic Metabolic Panel: Recent Labs  Lab 06/17/19 0718 06/18/19 0600  NA 141 141  K 5.3* 5.6*  CL 101 101  CO2 24 24  GLUCOSE 73 85  BUN 57* 70*  CREATININE 11.98* 14.08*  CALCIUM 7.6* 7.6*  PHOS  --  6.7*   GFR Estimated Creatinine Clearance: 6.4 mL/min (A) (by C-G formula based on SCr of 14.08 mg/dL (H)). Liver Function Tests: Recent Labs  Lab 06/17/19 0718 06/18/19 0600  AST 24 19  ALT 18 17  ALKPHOS 240* 211*  BILITOT 0.4 1.0  PROT 7.7 7.4  ALBUMIN 3.4* 3.2*   Recent Labs  Lab 06/17/19 0718 06/18/19 0600  LIPASE 148* 36   No results for input(s): AMMONIA in the last 168 hours. Coagulation profile No results for input(s): INR, PROTIME in the last 168 hours.  CBC: Recent Labs  Lab 06/17/19 0718 06/18/19 0600 06/18/19 1533  WBC 1.9* 5.2 6.3  NEUTROABS  --  3.6 5.0  HGB 7.0* 9.6* 10.4*  HCT 22.8* 30.6* 32.3*  MCV 93.1 89.2 87.8  PLT 54* 154 153   Cardiac Enzymes: No results for input(s): CKTOTAL, CKMB, CKMBINDEX, TROPONINI in the last 168 hours. BNP (last 3 results) No results for input(s): PROBNP in the last 8760 hours. CBG: Recent Labs  Lab 06/18/19 1146 06/18/19 1300 06/18/19 1334 06/18/19 1552 06/19/19 1053  GLUCAP 79 66* 63* 89 79   D-Dimer: No results for input(s): DDIMER in the last 72 hours. Hgb A1c: No results for input(s): HGBA1C in the last 72 hours. Lipid Profile: No results for input(s): CHOL, HDL, LDLCALC, TRIG, CHOLHDL, LDLDIRECT in the last 72 hours. Thyroid  function studies: No results for input(s): TSH, T4TOTAL, T3FREE, THYROIDAB in the last 72 hours.  Invalid input(s): FREET3 Anemia work up: No results for input(s): VITAMINB12, FOLATE, FERRITIN, TIBC, IRON, RETICCTPCT in the last 72 hours. Sepsis Labs: Recent Labs  Lab 06/17/19 0718 06/18/19 0600 06/18/19 1533  WBC 1.9* 5.2 6.3    Microbiology Recent Results (from the past 240 hour(s))  SARS CORONAVIRUS 2 (TAT 6-24 HRS) Nasopharyngeal Nasopharyngeal Swab     Status: None   Collection Time: 06/17/19  9:56 AM   Specimen: Nasopharyngeal Swab  Result Value Ref Range Status   SARS Coronavirus 2 NEGATIVE NEGATIVE Final    Comment: (NOTE) SARS-CoV-2 target nucleic acids are NOT DETECTED. The SARS-CoV-2 RNA is generally detectable in upper and lower respiratory specimens during the acute phase of infection. Negative results do not preclude SARS-CoV-2 infection, do not rule out co-infections with other pathogens, and should  not be used as the sole basis for treatment or other patient management decisions. Negative results must be combined with clinical observations, patient history, and epidemiological information. The expected result is Negative. Fact Sheet for Patients: SugarRoll.be Fact Sheet for Healthcare Providers: https://www.woods-mathews.com/ This test is not yet approved or cleared by the [place] FDA and  has been authorized for detection and/or diagnosis of SARS-CoV-2 by FDA under an Emergency Use Authorization (EUA). This EUA will remain  in effect (meaning this test can be used) for the duration of the COVID-19 declaration under Section 56 4(b)(1) of the Act, 21 U.S.C. section 360bbb-3(b)(1), unless the authorization is terminated or revoked sooner. Performed at [facility], [place] [address] [place], [place] [address]     Procedures and diagnostic studies:  No results found.  Medications:   . sodium chloride    Intravenous Once  . amLODipine  10 mg Oral Daily  . Chlorhexidine Gluconate Cloth  6 each Topical Q0600  . Chlorhexidine Gluconate Cloth  6 each Topical Q0600  . hydrALAZINE  100 mg Oral TID  . lanthanum  1,000 mg Oral TID WC  . multivitamin  1 tablet Oral Daily  . pantoprazole (PROTONIX) IV  40 mg Intravenous Q12H  . senna-docusate  2 tablet Oral QHS  . sodium chloride flush  3 mL Intravenous Once  . sodium chloride flush  3 mL Intravenous Q12H   Continuous Infusions: . sodium chloride    . sodium thiosulfate infusion for calciphylaxis       LOS: 2 days   Judith Demps  [facility]   *Please refer to [email or web address], password [email or web address] to get updated schedule on who will round on this patient, as hospitalists switch teams weekly. If 7PM-7AM, please contact night-coverage at [email or web address], password [email or web address] for any overnight needs.  06/19/2019, 2:45 PM

## 2019-06-19 NOTE — Progress Notes (Signed)
Pt refused vitals.  Frank [name] M Hadli Vandemark, RN  

## 2019-06-19 NOTE — Progress Notes (Signed)
[facility] Progress Note   Subjective:   Patient seen in room. Ongoing abdominal pain and nausea this AM. Denies CP, palpitations, SOB, cough, and dizziness.   Objective Vitals:   06/18/19 1550 06/18/19 2052 06/19/19 0100 06/19/19 0541  BP: (!) 192/96 (!) 164/101  (!) 198/90  Pulse: 87 80 85 85  Resp: _0 Temp:  98.6 F (37 C)  98 F (36.7 C)  TempSrc:  Oral  Oral  SpO2:  100% 100% 96%  Weight:    76.1 kg  Height:       Physical Exam General: Well developed, well nourished male. Alert and in NAD Heart: RRR, no murmurs, rubs or gallops Lungs: CTA b/l without wheezing, rhonchi or rales Abdomen: Soft, non-tender, non-distended. +BS Extremities: No edema b/l lower extremities Dialysis Access: LUE AVF aneurysmal, + bruit  Additional Objective Labs: Basic Metabolic Panel: Recent Labs  Lab 06/17/19 0718 06/18/19 0600  NA 141 141  K 5.3* 5.6*  CL 101 101  CO2 24 24  GLUCOSE 73 85  BUN 57* 70*  CREATININE 11.98* 14.08*  CALCIUM 7.6* 7.6*  PHOS  --  6.7*   Liver Function Tests: Recent Labs  Lab 06/17/19 0718 06/18/19 0600  AST 24 19  ALT 18 17  ALKPHOS 240* 211*  BILITOT 0.4 1.0  PROT 7.7 7.4  ALBUMIN 3.4* 3.2*   Recent Labs  Lab 06/17/19 0718 06/18/19 0600  LIPASE 148* 36   CBC: Recent Labs  Lab 06/17/19 0718 06/18/19 0600 06/18/19 1533  WBC 1.9* 5.2 6.3  NEUTROABS  --  3.6 5.0  HGB 7.0* 9.6* 10.4*  HCT 22.8* 30.6* 32.3*  MCV 93.1 89.2 87.8  PLT 54* 154 153   Blood Culture    Component Value Date/Time   SDES PLEURAL 07/29/2018 1300   SDES PLEURAL 07/29/2018 1300   SPECREQUEST NONE 07/29/2018 1300   SPECREQUEST NONE 07/29/2018 1300   CULT  07/29/2018 1300    NO GROWTH 5 DAYS Performed at [facility], [place] [address] [place], [place] [address]    REPTSTATUS 08/03/2018 FINAL 07/29/2018 1300   REPTSTATUS 07/29/2018 FINAL 07/29/2018 1300    CBG: Recent Labs  Lab 06/18/19 1146 06/18/19 1300 06/18/19 1334  06/18/19 1552 06/19/19 1053  GLUCAP 79 66* 63* 89 79    Studies/Results: Ct Abdomen Pelvis Wo Contrast  Result Date: 06/17/2019 CLINICAL DATA:  Patient presenting with 3 to 4-day history of worsening epigastric abdominal pain worse than his chronic pain per patient, with associated 4 to 5-day history of nausea and vomiting with dark emesis and some reddish emesis per patient. EXAM: CT ABDOMEN AND PELVIS WITHOUT CONTRAST TECHNIQUE: Multidetector CT imaging of the abdomen and pelvis was performed following the standard protocol without IV contrast. COMPARISON:  CT, 05/06/2019 and older exams. FINDINGS: Lower chest: Chronic right lung base pleuroparenchymal scarring and rounded atelectasis, without significant change from the prior CT. Stable mild-to-moderate cardiomegaly. Stable trace pericardial effusion. No acute findings. Hepatobiliary: No focal liver abnormality is seen. No gallstones, gallbladder wall thickening, or biliary dilatation. Pancreas: Unremarkable. No pancreatic ductal dilatation or surrounding inflammatory changes. Spleen: Normal in size without focal abnormality. Adrenals/Urinary Tract: Marked renal atrophy. Homogeneous hyperattenuating 3.3 cm left renal mass consistent a hemorrhagic cyst, stable from prior CT. Smaller adjacent hypoattenuating left renal mass consistent with a simple cyst, also unchanged. No hydronephrosis. Normal ureters. Bladder is mostly decompressed but otherwise unremarkable. Soft tissue with dystrophic calcifications in the left iliac fossa is stable  reflecting failed transplant kidney. Stomach/Bowel: Stomach mostly decompressed. No evidence of wall thickening. No adjacent inflammation. Small bowel and colon are normal in caliber. No wall thickening or inflammation. No evidence of appendicitis. Vascular/Lymphatic: Dense aortic atherosclerotic calcifications. No aneurysm. No enlarged lymph nodes. Reproductive: Unremarkable. Other: No abdominal wall hernia.  No ascites.  Musculoskeletal: Skeletal changes of renal osteodystrophy. No fracture. No bone lesion. IMPRESSION: 1. No acute findings within the abdomen or pelvis. No findings to account for the patient's symptoms. 2. Multiple chronic findings as detailed above, which are stable from CT. Electronically Signed   By: Lajean Manes M.D.   On: 06/17/2019 13:34   Medications: . sodium chloride    . sodium thiosulfate infusion for calciphylaxis     . sodium chloride   Intravenous Once  . amLODipine  10 mg Oral Daily  . Chlorhexidine Gluconate Cloth  6 each Topical Q0600  . Chlorhexidine Gluconate Cloth  6 each Topical Q0600  . hydrALAZINE  100 mg Oral TID  . lanthanum  1,000 mg Oral TID WC  . multivitamin  1 tablet Oral Daily  . pantoprazole (PROTONIX) IV  40 mg Intravenous Q12H  . senna-docusate  2 tablet Oral QHS  . sodium chloride flush  3 mL Intravenous Once  . sodium chloride flush  3 mL Intravenous Q12H    Dialysis Orders: [facility] 4h 400/800 2K/2.25Ca L AVF No heparinEDW 71.5kg -Na thio 25g TIW  Assessment/Plan: 1. Abd pain/N/V/ hx of recurpancreatitis - per primary 2. Bleeding AVF - skin is thinning over AVF, no prolonged bleeding after HD yesterday. VVS consulted and recommends rotating cannulation sites. Appreciate their recommendations.   3. ESRD -HD TTS.Missing HDdue to abd pain signing off early per the patient. Had HD yesterday off schedule due to missed treatments.  Will resume TTS schedule with HD today. K+ 5.6 pre-HD yesterday, follow.  4. Anemia ckd / abl - Hb dropped here to 7.0 but up to 9.6 then 10.4 without intervention. Initial hemoglobin may have been an error, follow.  5. Hyperkalemia -Received kayexalate on admission, K+ 5.6 pre-HD yesterday, will recheck before dialysis today.  6. Hypertension/volume -Improving, Over EDW by 4.6 kg this AM. No resp issues but BP is also elevated. On home meds amlodipine and hydralazine. Follow post-HD, ma require an  additional agent. Further UF with HD.  7. Metabolic bone disease -Hx calciphylaxis. No Ca/VDRA. Gets Na thio w HD. Parsabiv not available in hospital. Fosrenol binder when eating.  Anice Paganini, PA-C 06/19/2019, 11:57 AM  [facility] Pager: [phone]

## 2019-06-19 NOTE — Evaluation (Signed)
Physical Therapy Evaluation Patient Details Name: Frank [name] MRN: [id number] DOB: 1965/11/09 Today's Date: 06/19/2019   History of Present Illness  Frank [name] is an 53 y.o. male with medical history significant of end-stage renal disease on hemodialysis x24 years. He presented to the hospital with abdominal pain, vomiting and black stools. Pt found to have a GIB.    Clinical Impression  Pt presented supine in bed with HOB elevated, awake and willing to participate in therapy session. Prior to admission, pt reported that he was independent with all functional mobility and ADLs. Pt lives alone in a single level apartment home. At the time of evaluation, pt greatly limited secondary to nausea and bouts of emesis after minimal activity. Despite this, pt moving very well with no physical assistance needed and no UE supports or AD needed. PT will continue to f/u with pt acutely to progress mobility as tolerated and appropriate.     Follow Up Recommendations No PT follow up    Equipment Recommendations  None recommended by PT    Recommendations for Other Services       Precautions / Restrictions Precautions Precautions: None Restrictions Weight Bearing Restrictions: No      Mobility  Bed Mobility Overal bed mobility: Independent             General bed mobility comments: pt began sittng EOB then stopped stating "I don't want to feel sick again so I not going to sit up". No physical assist was required as he initiated sup - sit  Transfers Overall transfer level: Modified independent Equipment used: None             General transfer comment: declined stating that he didn't want to get nauseous again  Ambulation/Gait Ambulation/Gait assistance: Supervision Gait Distance (Feet): 15 Feet Assistive device: None Gait Pattern/deviations: Step-through pattern     General Gait Details: pt steady with ambulation without use of an AD or UE supports; however, pt with sudden  onset nausea and bouts of emesis  Stairs            Wheelchair Mobility    Modified Rankin (Stroke Patients Only)       Balance Overall balance assessment: No apparent balance deficits (not formally assessed)                                           Pertinent Vitals/Pain Pain Assessment: No/denies pain    Home Living Family/patient expects to be discharged to:: Private residence Living Arrangements: Alone Available Help at Discharge: Family;Available PRN/intermittently Type of Home: Apartment Home Access: Stairs to enter     Home Layout: One level Home Equipment: Environmental consultant - 2 wheels;Shower seat;Cane - single point      Prior Function Level of Independence: Independent               Hand Dominance   Dominant Hand: Right    Extremity/Trunk Assessment   Upper Extremity Assessment Upper Extremity Assessment: Defer to OT evaluation;Overall [facility] for tasks assessed    Lower Extremity Assessment Lower Extremity Assessment: Overall WFL for tasks assessed    Cervical / Trunk Assessment Cervical / Trunk Assessment: Normal  Communication   Communication: No difficulties  Cognition Arousal/Alertness: Awake/alert Behavior During Therapy: WFL for tasks assessed/performed Overall Cognitive Status: Within Functional Limits for tasks assessed  General Comments      Exercises     Assessment/Plan    PT Assessment Patient needs continued PT services  PT Problem List Decreased activity tolerance;Decreased mobility       PT Treatment Interventions DME instruction;Gait training;Stair training;Therapeutic activities;Functional mobility training;Therapeutic exercise;Balance training;Neuromuscular re-education;Patient/family education    PT Goals (Current goals can be found in the Care Plan section)  Acute Rehab PT Goals Patient Stated Goal: "home soon" PT Goal Formulation: With  patient Time For Goal Achievement: 07/03/19 Potential to Achieve Goals: Good    Frequency Min 3X/week   Barriers to discharge        Co-evaluation               AM-PAC PT "6 Clicks" Mobility  Outcome Measure Help needed turning from your back to your side while in a flat bed without using bedrails?: None Help needed moving from lying on your back to sitting on the side of a flat bed without using bedrails?: None Help needed moving to and from a bed to a chair (including a wheelchair)?: None Help needed standing up from a chair using your arms (e.g., wheelchair or bedside chair)?: None Help needed to walk in hospital room?: A Little Help needed climbing 3-5 steps with a railing? : A Little 6 Click Score: 22    End of Session   Activity Tolerance: Other (comment)(limited secondary to emesis) Patient left: in bed;with call bell/phone within reach Nurse Communication: Mobility status;Other (comment)(pt requesting nausea meds) PT Visit Diagnosis: Other abnormalities of gait and mobility (R26.89)    Time: 4034-7425 PT Time Calculation (min) (ACUTE ONLY): 15 min   Charges:   PT Evaluation $PT Eval Low Complexity: 1 Low          Eduard Clos, PT, DPT  Acute Rehabilitation Services Pager [phone] Office [place] 06/19/2019, 1:29 PM

## 2019-06-19 NOTE — Evaluation (Addendum)
Occupational Therapy Evaluation Patient Details Name: Frank [name] MRN: [id number] DOB: 1965/11/19 Today's Date: 06/19/2019     History of Present Illness Frank [name] is an 53 y.o. male with medical history significant of end-stage renal disease on hemodialysis x24 years. He presented to the hospital with abdominal pain, vomiting and black stools.   Clinical Impression   Eval limited. Pt in bed upon arrival and stated that he just had nausea meds 5 minutes ago. OT attempted to see pt around 10:45 am and pt requested OT return at noon once nausea meds had a chance to work that he was suppose to receiving shortly. OT returned a spt requested and pt only  agreeable to bed level activity. Pt with impaired strength ans endurance and would benefit from acute OT to maximize level of function and safety    Follow Up Recommendations  No OT follow up    Equipment Recommendations  None recommended by OT    Recommendations for Other Services       Precautions / Restrictions Precautions Precautions: None Restrictions Weight Bearing Restrictions: No      Mobility Bed Mobility               General bed mobility comments: pt began sittng EOB then stopped stating "I don't want to feel sick again so I not going to sit up". No physical assist was required as he initiated sup - sit  Transfers                 General transfer comment: declined stating that he didn't want to get nauseous again    Balance                                           ADL either performed or assessed with clinical judgement   ADL Overall ADL's : Needs assistance/impaired Eating/Feeding: Independent;Sitting   Grooming: Wash/dry hands;Wash/dry face;Set up;Bed level   Upper Body Bathing: Set up;Bed level   Lower Body Bathing: Min guard;Bed level   Upper Body Dressing : Set up;Bed level   Lower Body Dressing: Min guard;Bed level     Toilet Transfer Details (indicate cue type  and reason): declined           General ADL Comments: pt declined sitting EOB for ADL tasks     Vision Patient Visual Report: No change from baseline       Perception     Praxis      Pertinent Vitals/Pain Pain Assessment: No/denies pain     Hand Dominance Right   Extremity/Trunk Assessment Upper Extremity Assessment Upper Extremity Assessment: Overall WFL for tasks assessed       Cervical / Trunk Assessment Cervical / Trunk Assessment: Normal   Communication Communication Communication: No difficulties   Cognition Arousal/Alertness: Awake/alert Behavior During Therapy: WFL for tasks assessed/performed Overall Cognitive Status: Within Functional Limits for tasks assessed                                     General Comments       Exercises     Shoulder Instructions      Home Living Family/patient expects to be discharged to:: Private residence Living Arrangements: Alone Available Help at Discharge: Family;Available PRN/intermittently Type of Home: Apartment Home Access: Stairs to enter  Home Layout: One level     Bathroom Shower/Tub: Teacher, early years/pre: Handicapped height     Home Equipment: Environmental consultant - 2 wheels;Shower seat;Cane - single point          Prior Functioning/Environment Level of Independence: Independent with assistive device(s)                 OT Problem List: Decreased strength;Decreased activity tolerance      OT Treatment/Interventions: Self-care/ADL training;DME and/or AE instruction;Therapeutic activities;Patient/family education    OT Goals(Current goals can be found in the care plan section) Acute Rehab OT Goals Patient Stated Goal: go home tomorrow OT Goal Formulation: With patient Time For Goal Achievement: 07/03/19 Potential to Achieve Goals: Good ADL Goals Pt Will Perform Grooming: with supervision;with set-up;with modified independence;standing Pt Will Perform Lower Body  Bathing: with supervision;with set-up;with modified independence;sit to/from stand Pt Will Perform Lower Body Dressing: with supervision;with set-up;with modified independence;sit to/from stand Pt Will Transfer to Toilet: with supervision;with modified independence;ambulating  OT Frequency:     Barriers to D/C:            Co-evaluation              AM-PAC OT "6 Clicks" Daily Activity     Outcome Measure Help from another person eating meals?: None Help from another person taking care of personal grooming?: None Help from another person toileting, which includes using toliet, bedpan, or urinal?: None Help from another person bathing (including washing, rinsing, drying)?: A Little Help from another person to put on and taking off regular upper body clothing?: None Help from another person to put on and taking off regular lower body clothing?: A Little 6 Click Score: 22   End of Session    Activity Tolerance: Other (comment)(nausea) Patient left: in bed  OT Visit Diagnosis: Muscle weakness (generalized) (M62.81)                Time: 8177-1165 OT Time Calculation (min): 20 min Charges:  OT General Charges $OT Visit: 1 Visit OT Evaluation $OT Eval Moderate Complexity: 1 Mod    Britt Bottom 06/19/2019, 12:52 PM

## 2019-06-19 NOTE — Progress Notes (Signed)
RN has called hemo three more times without being able to get ahold of anyone. Pt is asking about dialysis, states he does not feel well. Pt had RN check oxygen level, which was at 95 percent on room air. Will continue to monitor.   Eleanora Neighbor, RN

## 2019-06-20 DIAGNOSIS — D62 Acute posthemorrhagic anemia: Secondary | ICD-10-CM

## 2019-06-20 DIAGNOSIS — D631 Anemia in chronic kidney disease: Secondary | ICD-10-CM

## 2019-06-20 DIAGNOSIS — K921 Melena: Principal | ICD-10-CM

## 2019-06-20 DIAGNOSIS — N185 Chronic kidney disease, stage 5: Secondary | ICD-10-CM

## 2019-06-20 DIAGNOSIS — K861 Other chronic pancreatitis: Secondary | ICD-10-CM

## 2019-06-20 DIAGNOSIS — K859 Acute pancreatitis without necrosis or infection, unspecified: Secondary | ICD-10-CM

## 2019-06-20 NOTE — Progress Notes (Signed)
Patient is leaving AMA. MD was notify.

## 2019-06-20 NOTE — Discharge Summary (Signed)
[place] Notification.   Notified by nursing that patient has left AMA. This happened prior to me seeing him. Assessment and plan is as per yesterday's note. Recommend he follow up with PCP as soon as possible.    .Principal Problem:   GI bleed Active Problems:   Anemia of chronic kidney failure   History of pulmonary embolism   DM (diabetes mellitus), type 2, uncontrolled, with renal complications (HCC)   Dyslipidemia   Essential hypertension   Chronic systolic heart failure (HCC)   Nonischemic cardiomyopathy (HCC)   Chronic pain   GERD (gastroesophageal reflux disease)   History of DVT (deep vein thrombosis)   ESRD needing dialysis (HCC)   Benign hypertensive heart and kidney disease with systolic CHF, NYHA class 3 and CKD stage 5 ([place])   End-stage renal disease on hemodialysis ([place])   PE (pulmonary thromboembolism) (HCC)   Chronic pancreatitis ([place])   Acute on chronic pancreatitis ([place])   Hypertensive urgency   Acute blood loss anemia   .Jonnie Finner, DO

## 2019-06-20 NOTE — Progress Notes (Signed)
RN spoke with HD nurse at this time and was told that there are two people in front of patient for dialysis and that he will probably be done in the am.   Eleanora Neighbor, RN

## 2019-06-20 NOTE — Progress Notes (Signed)
Pt refused all vitals except checking oxygen.   Eleanora Neighbor, RN

## 2019-06-21 ENCOUNTER — Other Ambulatory Visit: Payer: Self-pay | Admitting: *Deleted

## 2019-06-21 ENCOUNTER — Encounter: Payer: Self-pay | Admitting: *Deleted

## 2019-06-21 LAB — BPAM RBC
Blood Product Expiration Date: 202012102359
Blood Product Expiration Date: 202012152359
Unit Type and Rh: 1700
Unit Type and Rh: 1700

## 2019-06-21 LAB — TYPE AND SCREEN
ABO/RH(D): B POS
Antibody Screen: POSITIVE
DAT, IgG: NEGATIVE
Donor AG Type: NEGATIVE
Donor AG Type: NEGATIVE
PT AG Type: NEGATIVE
Unit division: 0
Unit division: 0

## 2019-06-21 NOTE — Patient Outreach (Signed)
[facility]) Care Management  06/21/2019  ATILANO COVELLI [birth year] [id number]   Referral received from [facility] coordinator as member has had multiple ED visits (23) and admissions (8) over the last 6 months.  Most recently discharged yesterday after being admitted for GI Bleed.  Per chart, he also has history of ESRD on dialysis, hypertension, cardiomyopathy, CHF, GERD, chronic pancreatitis, and dyslipidemia.  Call placed to member, identity verified.  This care manager introduced self and stated purpose of call.  [facility] care management services explained.  He immediately begin to voice frustration regarding the reason he has had so many visits to the ED and hospital admissions, stating "when I go they don't do anything."  Reportedly this is the first admission he has seen the GI specialist although most of his visits are due to abdominal issues.  He is also concerned with being able to pay for the specialists.  State he is needing to move in order to reduce his bills.  He feels he now qualifies for senior that is more affordable, which will in turn help him to be able to pay for food, rent, and utilities.    State his PCP is Simona Huh, NP at [facility], has appointment tomorrow.  Denies need for transportation.  Also has appointment with GI specialist on 12/21.  Attempted to review medications, complete assessment, and establish goals but member did not have time today.  Will follow up within the next week.  [name], [place], MSN [place] [phone]

## 2019-06-22 ENCOUNTER — Encounter (HOSPITAL_COMMUNITY): Payer: Self-pay | Admitting: Emergency Medicine

## 2019-06-22 ENCOUNTER — Emergency Department (HOSPITAL_COMMUNITY)
Admission: EM | Admit: 2019-06-22 | Discharge: 2019-06-22 | Disposition: A | Discharge status: Home / Self Care | Payer: Medicare Other | Attending: Emergency Medicine | Admitting: Emergency Medicine

## 2019-06-22 ENCOUNTER — Other Ambulatory Visit: Payer: Self-pay

## 2019-06-22 DIAGNOSIS — I132 Hypertensive heart and chronic kidney disease with heart failure and with stage 5 chronic kidney disease, or end stage renal disease: Secondary | ICD-10-CM | POA: Insufficient documentation

## 2019-06-22 DIAGNOSIS — Z7901 Long term (current) use of anticoagulants: Secondary | ICD-10-CM | POA: Insufficient documentation

## 2019-06-22 DIAGNOSIS — Z79899 Other long term (current) drug therapy: Secondary | ICD-10-CM | POA: Insufficient documentation

## 2019-06-22 DIAGNOSIS — Z992 Dependence on renal dialysis: Secondary | ICD-10-CM | POA: Insufficient documentation

## 2019-06-22 DIAGNOSIS — E1122 Type 2 diabetes mellitus with diabetic chronic kidney disease: Secondary | ICD-10-CM | POA: Insufficient documentation

## 2019-06-22 DIAGNOSIS — R0602 Shortness of breath: Secondary | ICD-10-CM | POA: Diagnosis not present

## 2019-06-22 DIAGNOSIS — Z940 Kidney transplant status: Secondary | ICD-10-CM | POA: Diagnosis not present

## 2019-06-22 DIAGNOSIS — N186 End stage renal disease: Secondary | ICD-10-CM | POA: Insufficient documentation

## 2019-06-22 DIAGNOSIS — I5042 Chronic combined systolic (congestive) and diastolic (congestive) heart failure: Secondary | ICD-10-CM | POA: Insufficient documentation

## 2019-06-22 DIAGNOSIS — R112 Nausea with vomiting, unspecified: Secondary | ICD-10-CM | POA: Diagnosis not present

## 2019-06-22 DIAGNOSIS — Z87891 Personal history of nicotine dependence: Secondary | ICD-10-CM | POA: Insufficient documentation

## 2019-06-22 DIAGNOSIS — E875 Hyperkalemia: Secondary | ICD-10-CM | POA: Insufficient documentation

## 2019-06-22 LAB — I-STAT CHEM 8, ED
BUN: 77 mg/dL — ABNORMAL HIGH (ref 6–20)
Calcium, Ion: 0.79 mmol/L — CL (ref 1.15–1.40)
Chloride: 104 mmol/L (ref 98–111)
Creatinine, Ser: 16.6 mg/dL — ABNORMAL HIGH (ref 0.61–1.24)
Glucose, Bld: 78 mg/dL (ref 70–99)
HCT: 35 % — ABNORMAL LOW (ref 39.0–52.0)
Hemoglobin: 11.9 g/dL — ABNORMAL LOW (ref 13.0–17.0)
Potassium: 5.7 mmol/L — ABNORMAL HIGH (ref 3.5–5.1)
Sodium: 138 mmol/L (ref 135–145)
TCO2: 21 mmol/L — ABNORMAL LOW (ref 22–32)

## 2019-06-22 MED ORDER — PROMETHAZINE HCL 25 MG/ML IJ SOLN
25.0000 mg | Freq: Once | INTRAMUSCULAR | Status: AC
  Administered 2019-06-22: 25 mg via INTRAMUSCULAR
  Filled 2019-06-22: qty 1

## 2019-06-22 MED ORDER — SODIUM ZIRCONIUM CYCLOSILICATE 10 G PO PACK
10.0000 g | PACK | ORAL | Status: AC
  Administered 2019-06-22: 10 g via ORAL
  Filled 2019-06-22: qty 1

## 2019-06-22 MED ORDER — HYDROMORPHONE HCL 1 MG/ML IJ SOLN
1.0000 mg | Freq: Once | INTRAMUSCULAR | Status: AC
  Administered 2019-06-22: 1 mg via INTRAMUSCULAR
  Filled 2019-06-22: qty 1

## 2019-06-22 NOTE — ED Triage Notes (Signed)
Pt has not had dialysis for last 5 days and is now feeling weak abd  Pain with N/V
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
## 2019-06-24 ENCOUNTER — Other Ambulatory Visit: Payer: Self-pay

## 2019-06-24 DIAGNOSIS — Z7982 Long term (current) use of aspirin: Secondary | ICD-10-CM | POA: Insufficient documentation

## 2019-06-24 DIAGNOSIS — N186 End stage renal disease: Secondary | ICD-10-CM | POA: Diagnosis not present

## 2019-06-24 DIAGNOSIS — K859 Acute pancreatitis without necrosis or infection, unspecified: Secondary | ICD-10-CM | POA: Insufficient documentation

## 2019-06-24 DIAGNOSIS — Z66 Do not resuscitate: Secondary | ICD-10-CM | POA: Diagnosis not present

## 2019-06-24 DIAGNOSIS — D631 Anemia in chronic kidney disease: Secondary | ICD-10-CM | POA: Diagnosis not present

## 2019-06-24 DIAGNOSIS — K920 Hematemesis: Secondary | ICD-10-CM | POA: Insufficient documentation

## 2019-06-24 DIAGNOSIS — K861 Other chronic pancreatitis: Secondary | ICD-10-CM | POA: Diagnosis not present

## 2019-06-24 DIAGNOSIS — I132 Hypertensive heart and chronic kidney disease with heart failure and with stage 5 chronic kidney disease, or end stage renal disease: Secondary | ICD-10-CM | POA: Insufficient documentation

## 2019-06-24 DIAGNOSIS — I5020 Unspecified systolic (congestive) heart failure: Secondary | ICD-10-CM | POA: Diagnosis not present

## 2019-06-24 DIAGNOSIS — Z79899 Other long term (current) drug therapy: Secondary | ICD-10-CM | POA: Insufficient documentation

## 2019-06-24 DIAGNOSIS — Z992 Dependence on renal dialysis: Secondary | ICD-10-CM | POA: Insufficient documentation

## 2019-06-24 DIAGNOSIS — R1011 Right upper quadrant pain: Secondary | ICD-10-CM | POA: Diagnosis present

## 2019-06-24 DIAGNOSIS — R109 Unspecified abdominal pain: Secondary | ICD-10-CM | POA: Diagnosis not present

## 2019-06-24 DIAGNOSIS — E1122 Type 2 diabetes mellitus with diabetic chronic kidney disease: Secondary | ICD-10-CM | POA: Insufficient documentation

## 2019-06-24 DIAGNOSIS — I120 Hypertensive chronic kidney disease with stage 5 chronic kidney disease or end stage renal disease: Secondary | ICD-10-CM | POA: Diagnosis not present

## 2019-06-24 DIAGNOSIS — R112 Nausea with vomiting, unspecified: Secondary | ICD-10-CM | POA: Diagnosis not present

## 2019-06-24 DIAGNOSIS — G894 Chronic pain syndrome: Secondary | ICD-10-CM | POA: Diagnosis not present

## 2019-06-24 MED ORDER — SODIUM CHLORIDE 0.9% FLUSH
3.0000 mL | Freq: Once | INTRAVENOUS | Status: AC
  Administered 2019-06-25: 3 mL via INTRAVENOUS

## 2019-06-24 NOTE — Patient Outreach (Signed)
[facility]) Care Management  06/24/2019  Frank [name] 03/13/1966 [id number]   Social work referral received from [facility], [employer].  "Request assistance with finding affordable housing. Having a hard time currently paying bills, looking to move into senior housing. Also requesting resources for food" Outreach to patient this morning but he asked a call back a little later.  Attempted to call back but had to leave message.  Will mail unsuccessful outreach letter and attempt to reach again within four business days.  [name], BSW Social Worker [phone]

## 2019-06-24 NOTE — ED Triage Notes (Signed)
Pt states he was woke up at 2000 and experienced 2 episodes of vomiting blood. Pt states the first episode was dark red and the second was lighter in color. Pt also c/o abd pain, denies N/D. Pt was recently discharge from the hospital.

## 2019-06-25 ENCOUNTER — Other Ambulatory Visit: Payer: Self-pay

## 2019-06-25 ENCOUNTER — Encounter (HOSPITAL_COMMUNITY): Payer: Self-pay

## 2019-06-25 ENCOUNTER — Emergency Department (HOSPITAL_COMMUNITY)
Admission: EM | Admit: 2019-06-25 | Discharge: 2019-06-25 | Disposition: A | Discharge status: Home / Self Care | Payer: Medicare Other | Attending: Emergency Medicine | Admitting: Emergency Medicine

## 2019-06-25 DIAGNOSIS — R109 Unspecified abdominal pain: Secondary | ICD-10-CM | POA: Diagnosis not present

## 2019-06-25 DIAGNOSIS — R112 Nausea with vomiting, unspecified: Secondary | ICD-10-CM | POA: Insufficient documentation

## 2019-06-25 DIAGNOSIS — Z7901 Long term (current) use of anticoagulants: Secondary | ICD-10-CM | POA: Insufficient documentation

## 2019-06-25 DIAGNOSIS — Z79899 Other long term (current) drug therapy: Secondary | ICD-10-CM | POA: Insufficient documentation

## 2019-06-25 DIAGNOSIS — E1122 Type 2 diabetes mellitus with diabetic chronic kidney disease: Secondary | ICD-10-CM | POA: Insufficient documentation

## 2019-06-25 DIAGNOSIS — N186 End stage renal disease: Secondary | ICD-10-CM | POA: Insufficient documentation

## 2019-06-25 DIAGNOSIS — Z992 Dependence on renal dialysis: Secondary | ICD-10-CM

## 2019-06-25 DIAGNOSIS — D631 Anemia in chronic kidney disease: Secondary | ICD-10-CM

## 2019-06-25 DIAGNOSIS — K920 Hematemesis: Secondary | ICD-10-CM

## 2019-06-25 DIAGNOSIS — K859 Acute pancreatitis without necrosis or infection, unspecified: Secondary | ICD-10-CM

## 2019-06-25 DIAGNOSIS — I5042 Chronic combined systolic (congestive) and diastolic (congestive) heart failure: Secondary | ICD-10-CM | POA: Insufficient documentation

## 2019-06-25 DIAGNOSIS — Z87891 Personal history of nicotine dependence: Secondary | ICD-10-CM | POA: Insufficient documentation

## 2019-06-25 DIAGNOSIS — N189 Chronic kidney disease, unspecified: Secondary | ICD-10-CM

## 2019-06-25 LAB — COMPREHENSIVE METABOLIC PANEL
ALT: 12 U/L (ref 0–44)
AST: 18 U/L (ref 15–41)
Albumin: 3.3 g/dL — ABNORMAL LOW (ref 3.5–5.0)
Alkaline Phosphatase: 222 U/L — ABNORMAL HIGH (ref 38–126)
Anion gap: 15 (ref 5–15)
BUN: 39 mg/dL — ABNORMAL HIGH (ref 6–20)
CO2: 30 mmol/L (ref 22–32)
Calcium: 7.5 mg/dL — ABNORMAL LOW (ref 8.9–10.3)
Chloride: 97 mmol/L — ABNORMAL LOW (ref 98–111)
Creatinine, Ser: 8.23 mg/dL — ABNORMAL HIGH (ref 0.61–1.24)
GFR calc Af Amer: 8 mL/min — ABNORMAL LOW (ref >60)
GFR calc non Af Amer: 7 mL/min — ABNORMAL LOW (ref >60)
Glucose, Bld: 75 mg/dL (ref 70–99)
Potassium: 4.4 mmol/L (ref 3.5–5.1)
Sodium: 142 mmol/L (ref 135–145)
Total Bilirubin: 0.6 mg/dL (ref 0.3–1.2)
Total Protein: 7.8 g/dL (ref 6.5–8.1)

## 2019-06-25 LAB — CBC
HCT: 29.6 % — ABNORMAL LOW (ref 39.0–52.0)
Hemoglobin: 9.1 g/dL — ABNORMAL LOW (ref 13.0–17.0)
MCH: 28 pg (ref 26.0–34.0)
MCHC: 30.7 g/dL (ref 30.0–36.0)
MCV: 91.1 fL (ref 80.0–100.0)
Platelets: 159 10*3/uL (ref 150–400)
RBC: 3.25 MIL/uL — ABNORMAL LOW (ref 4.22–5.81)
RDW: 16.2 % — ABNORMAL HIGH (ref 11.5–15.5)
WBC: 4.2 10*3/uL (ref 4.0–10.5)
nRBC: 0 % (ref 0.0–0.2)

## 2019-06-25 LAB — LIPASE, BLOOD: Lipase: 63 U/L — ABNORMAL HIGH (ref 11–51)

## 2019-06-25 LAB — HEMOGLOBIN AND HEMATOCRIT, BLOOD
HCT: 29.2 % — ABNORMAL LOW (ref 39.0–52.0)
Hemoglobin: 9.2 g/dL — ABNORMAL LOW (ref 13.0–17.0)

## 2019-06-25 MED ORDER — MORPHINE SULFATE (PF) 4 MG/ML IV SOLN
4.0000 mg | Freq: Once | INTRAVENOUS | Status: AC
  Administered 2019-06-25: 4 mg via INTRAVENOUS
  Filled 2019-06-25: qty 1

## 2019-06-25 MED ORDER — PANTOPRAZOLE SODIUM 40 MG IV SOLR
40.0000 mg | Freq: Once | INTRAVENOUS | Status: AC
  Administered 2019-06-25: 04:00:00 40 mg via INTRAVENOUS
  Filled 2019-06-25: qty 40

## 2019-06-25 MED ORDER — HYDROMORPHONE HCL 1 MG/ML IJ SOLN
1.0000 mg | Freq: Once | INTRAMUSCULAR | Status: AC
  Administered 2019-06-25: 1 mg via INTRAVENOUS
  Filled 2019-06-25: qty 1

## 2019-06-25 MED ORDER — ONDANSETRON HCL 4 MG/2ML IJ SOLN
4.0000 mg | Freq: Once | INTRAMUSCULAR | Status: AC
  Administered 2019-06-25: 4 mg via INTRAVENOUS
  Filled 2019-06-25: qty 2

## 2019-06-25 MED ORDER — ONDANSETRON 8 MG PO TBDP: 8.0000 mg | ORAL_TABLET | Freq: Three times a day (TID) | ORAL | 0 refills | Status: DC | PRN

## 2019-06-25 MED ORDER — ONDANSETRON 8 MG PO TBDP
8.0000 mg | ORAL_TABLET | Freq: Once | ORAL | Status: AC
  Administered 2019-06-25: 10:00:00 8 mg via ORAL
  Filled 2019-06-25: qty 1
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
## 2019-06-25 NOTE — ED Triage Notes (Signed)
Pt coming in c/o emesis x3 times since being seen last night with 2 episodes having blood in it.

## 2019-06-25 NOTE — ED Provider Notes (Signed)
53yo male ho gi bleed, anticoagulation, presents with hematemesis recently transfused to hgb 12, now at 9.  Dr. [name] saw and evaluated.  Patient off eliquis for several days.  ESRD on dialysis.  Ho pancreatitis with lipase 60s.  Patient normally around 9 grams of hgb.  Dr. [name] states if hemodynamically stable and hgb delta less than 0.5 grams, patient stable for discharge. Will review hgb when returns and assess hemodynamic status and d/c if stable. Hgb is stable.  Plan discharge  10:21 AM Patient has taken p.o. without vomiting.  No further bleeding noted.  Discussed return precautions and need for follow-up and voices understanding.   Pattricia Boss, MD 06/25/19 1021

## 2019-06-25 NOTE — Discharge Instructions (Addendum)
Use the 8 mg zofran orally dissolving tabs for nausea instead of the 4 mg tabs that you currently have. Continue to hold your eliquis until your doctor instructs you to restart Return if any return of bleeding.

## 2019-06-25 NOTE — Patient Outreach (Signed)
[facility]) Care Management  06/25/2019  Frank [name] [birth year] [phone]   Patient referred to social work for assistance with locating affordable housing and food resources. Successful outreach to patient today. Talked with patient about [employer] as resource for locating housing.  Offered to send him a list of options in his price range from this site but patient stated that he would search.  Informed patient that most low income housing options are going to have a wait list especially during this time of [place] when people are being protected from eviction.  Encouraged him to get added to wait list for properties that he may be interested in. Mailed [employer] and [employer] which list all pantries and kitchens in the [facility] area.  Will follow up with patient within the next two weeks to ensure receipt of resources and get update on housing options.  [name], BSW Social Worker [phone]

## 2019-06-25 NOTE — ED Notes (Signed)
RN reports Pt uncooperative with additional blood draws.

## 2019-06-26 ENCOUNTER — Encounter (HOSPITAL_COMMUNITY): Payer: Self-pay | Admitting: Emergency Medicine

## 2019-06-26 ENCOUNTER — Emergency Department (HOSPITAL_COMMUNITY)
Admission: EM | Admit: 2019-06-26 | Discharge: 2019-06-26 | Disposition: A | Discharge status: Home / Self Care | Payer: Medicare Other | Source: Home / Self Care | Attending: Emergency Medicine | Admitting: Emergency Medicine

## 2019-06-26 DIAGNOSIS — R112 Nausea with vomiting, unspecified: Secondary | ICD-10-CM

## 2019-06-26 LAB — COMPREHENSIVE METABOLIC PANEL
ALT: 12 U/L (ref 0–44)
AST: 15 U/L (ref 15–41)
Albumin: 3.5 g/dL (ref 3.5–5.0)
Alkaline Phosphatase: 211 U/L — ABNORMAL HIGH (ref 38–126)
Anion gap: 18 — ABNORMAL HIGH (ref 5–15)
BUN: 51 mg/dL — ABNORMAL HIGH (ref 6–20)
CO2: 27 mmol/L (ref 22–32)
Calcium: 7.5 mg/dL — ABNORMAL LOW (ref 8.9–10.3)
Chloride: 97 mmol/L — ABNORMAL LOW (ref 98–111)
Creatinine, Ser: 10.24 mg/dL — ABNORMAL HIGH (ref 0.61–1.24)
GFR calc Af Amer: 6 mL/min — ABNORMAL LOW (ref >60)
GFR calc non Af Amer: 5 mL/min — ABNORMAL LOW (ref >60)
Glucose, Bld: 82 mg/dL (ref 70–99)
Potassium: 4.8 mmol/L (ref 3.5–5.1)
Sodium: 142 mmol/L (ref 135–145)
Total Bilirubin: 0.4 mg/dL (ref 0.3–1.2)
Total Protein: 8.1 g/dL (ref 6.5–8.1)

## 2019-06-26 LAB — CBC
HCT: 30.7 % — ABNORMAL LOW (ref 39.0–52.0)
Hemoglobin: 9.5 g/dL — ABNORMAL LOW (ref 13.0–17.0)
MCH: 28.1 pg (ref 26.0–34.0)
MCHC: 30.9 g/dL (ref 30.0–36.0)
MCV: 90.8 fL (ref 80.0–100.0)
Platelets: 185 10*3/uL (ref 150–400)
RBC: 3.38 MIL/uL — ABNORMAL LOW (ref 4.22–5.81)
RDW: 16.2 % — ABNORMAL HIGH (ref 11.5–15.5)
WBC: 4.7 10*3/uL (ref 4.0–10.5)
nRBC: 0 % (ref 0.0–0.2)

## 2019-06-26 LAB — LIPASE, BLOOD: Lipase: 67 U/L — ABNORMAL HIGH (ref 11–51)

## 2019-06-26 MED ORDER — SUCRALFATE 1 GM/10ML PO SUSP: 1.0000 g | Freq: Three times a day (TID) | ORAL | Status: DC

## 2019-06-26 MED ORDER — SODIUM CHLORIDE 0.9% FLUSH
3.0000 mL | Freq: Once | INTRAVENOUS | Status: AC
  Administered 2019-06-26: 3 mL via INTRAVENOUS

## 2019-06-26 MED ORDER — KETOROLAC TROMETHAMINE 30 MG/ML IJ SOLN
30.0000 mg | Freq: Once | INTRAMUSCULAR | Status: AC
  Administered 2019-06-26: 30 mg via INTRAVENOUS
  Filled 2019-06-26: qty 1

## 2019-06-26 MED ORDER — ONDANSETRON 8 MG PO TBDP
8.0000 mg | ORAL_TABLET | Freq: Once | ORAL | Status: AC
  Administered 2019-06-26: 8 mg via ORAL
  Filled 2019-06-26: qty 1

## 2019-06-26 MED ORDER — ALUM & MAG HYDROXIDE-SIMETH 200-200-20 MG/5ML PO SUSP
30.0000 mL | Freq: Once | ORAL | Status: AC
  Administered 2019-06-26: 30 mL via ORAL
  Filled 2019-06-26: qty 30

## 2019-06-26 MED ORDER — LIDOCAINE VISCOUS HCL 2 % MT SOLN
15.0000 mL | Freq: Once | OROMUCOSAL | Status: AC
  Administered 2019-06-26: 15 mL via ORAL
  Filled 2019-06-26: qty 15

## 2019-06-26 NOTE — ED Notes (Signed)
While ambulating back to pt room from the restroom pt stated "I am still throwing up". Pt stated this happened in the restroom which was unwitnessed and there was no emesis to observe.

## 2019-06-26 NOTE — ED Notes (Signed)
Provided pt with multiple warm blankets at pts request.

## 2019-06-26 NOTE — Progress Notes (Signed)
Patient refused IV start and requested a different IV nurse. MD made aware. IV consult completed.
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
## 2019-06-26 NOTE — ED Notes (Signed)
Pt wants to wait for IV team, for placement of IV. He states "I do not want to be stuck a bunch of times, I always need IV team to do my IVs."

## 2019-06-26 NOTE — ED Notes (Signed)
Pts o2 sats reading 99% on room air but pt is insistent that he needs Oxygen or he will " struggle to breathe and maintain his O2 sats". Placed pt on 1L O2 via nasal canula for comfort.

## 2019-06-26 NOTE — ED Notes (Signed)
Pt states he is throwing up dark red blood x2. He states this has been happening since yesterday. Pt denies fever, diarrhea, pt denies urinary symptoms. Pt also denies any chest pain.

## 2019-06-26 NOTE — Progress Notes (Signed)
After multiple conversations and attempts by nursing staff  to reassure patient of need for ABG and Dr. Kayleen Memos, patient adamantly refuses.  Dr. Arville Go aware of patients refusal.

## 2019-06-28 ENCOUNTER — Encounter (HOSPITAL_COMMUNITY): Payer: Self-pay

## 2019-06-28 ENCOUNTER — Other Ambulatory Visit: Payer: Self-pay | Admitting: *Deleted

## 2019-06-28 ENCOUNTER — Emergency Department (HOSPITAL_COMMUNITY)
Admission: EM | Admit: 2019-06-28 | Discharge: 2019-06-28 | Disposition: A | Discharge status: Home / Self Care | Payer: Medicare Other | Attending: Emergency Medicine | Admitting: Emergency Medicine

## 2019-06-28 ENCOUNTER — Ambulatory Visit: Payer: Self-pay

## 2019-06-28 ENCOUNTER — Encounter: Payer: Self-pay | Admitting: *Deleted

## 2019-06-28 ENCOUNTER — Other Ambulatory Visit: Payer: Self-pay

## 2019-06-28 DIAGNOSIS — I132 Hypertensive heart and chronic kidney disease with heart failure and with stage 5 chronic kidney disease, or end stage renal disease: Secondary | ICD-10-CM | POA: Diagnosis not present

## 2019-06-28 DIAGNOSIS — K859 Acute pancreatitis without necrosis or infection, unspecified: Secondary | ICD-10-CM | POA: Insufficient documentation

## 2019-06-28 DIAGNOSIS — E1122 Type 2 diabetes mellitus with diabetic chronic kidney disease: Secondary | ICD-10-CM | POA: Insufficient documentation

## 2019-06-28 DIAGNOSIS — Z992 Dependence on renal dialysis: Secondary | ICD-10-CM | POA: Insufficient documentation

## 2019-06-28 DIAGNOSIS — Z87891 Personal history of nicotine dependence: Secondary | ICD-10-CM | POA: Insufficient documentation

## 2019-06-28 DIAGNOSIS — I120 Hypertensive chronic kidney disease with stage 5 chronic kidney disease or end stage renal disease: Secondary | ICD-10-CM | POA: Diagnosis not present

## 2019-06-28 DIAGNOSIS — I5022 Chronic systolic (congestive) heart failure: Secondary | ICD-10-CM | POA: Insufficient documentation

## 2019-06-28 DIAGNOSIS — Z79899 Other long term (current) drug therapy: Secondary | ICD-10-CM | POA: Insufficient documentation

## 2019-06-28 DIAGNOSIS — N186 End stage renal disease: Secondary | ICD-10-CM

## 2019-06-28 DIAGNOSIS — K861 Other chronic pancreatitis: Secondary | ICD-10-CM | POA: Insufficient documentation

## 2019-06-28 DIAGNOSIS — K920 Hematemesis: Secondary | ICD-10-CM | POA: Insufficient documentation

## 2019-06-28 DIAGNOSIS — Z7901 Long term (current) use of anticoagulants: Secondary | ICD-10-CM | POA: Diagnosis not present

## 2019-06-28 DIAGNOSIS — R1110 Vomiting, unspecified: Secondary | ICD-10-CM | POA: Diagnosis present

## 2019-06-28 LAB — CBC WITH DIFFERENTIAL/PLATELET
Abs Immature Granulocytes: 0.03 10*3/uL (ref 0.00–0.07)
Basophils Absolute: 0 10*3/uL (ref 0.0–0.1)
Basophils Relative: 1 %
Eosinophils Absolute: 0.3 10*3/uL (ref 0.0–0.5)
Eosinophils Relative: 6 %
HCT: 30.1 % — ABNORMAL LOW (ref 39.0–52.0)
Hemoglobin: 9 g/dL — ABNORMAL LOW (ref 13.0–17.0)
Immature Granulocytes: 1 %
Lymphocytes Relative: 14 %
Lymphs Abs: 0.7 10*3/uL (ref 0.7–4.0)
MCH: 27.3 pg (ref 26.0–34.0)
MCHC: 29.9 g/dL — ABNORMAL LOW (ref 30.0–36.0)
MCV: 91.2 fL (ref 80.0–100.0)
Monocytes Absolute: 0.6 10*3/uL (ref 0.1–1.0)
Monocytes Relative: 12 %
Neutro Abs: 3.2 10*3/uL (ref 1.7–7.7)
Neutrophils Relative %: 66 %
Platelets: 190 10*3/uL (ref 150–400)
RBC: 3.3 MIL/uL — ABNORMAL LOW (ref 4.22–5.81)
RDW: 16.2 % — ABNORMAL HIGH (ref 11.5–15.5)
WBC: 4.8 10*3/uL (ref 4.0–10.5)
nRBC: 0 % (ref 0.0–0.2)

## 2019-06-28 LAB — COMPREHENSIVE METABOLIC PANEL
ALT: 14 U/L (ref 0–44)
AST: 16 U/L (ref 15–41)
Albumin: 3.8 g/dL (ref 3.5–5.0)
Alkaline Phosphatase: 230 U/L — ABNORMAL HIGH (ref 38–126)
Anion gap: 17 — ABNORMAL HIGH (ref 5–15)
BUN: 56 mg/dL — ABNORMAL HIGH (ref 6–20)
CO2: 27 mmol/L (ref 22–32)
Calcium: 8 mg/dL — ABNORMAL LOW (ref 8.9–10.3)
Chloride: 98 mmol/L (ref 98–111)
Creatinine, Ser: 10.42 mg/dL — ABNORMAL HIGH (ref 0.61–1.24)
GFR calc Af Amer: 6 mL/min — ABNORMAL LOW (ref >60)
GFR calc non Af Amer: 5 mL/min — ABNORMAL LOW (ref >60)
Glucose, Bld: 86 mg/dL (ref 70–99)
Potassium: 5 mmol/L (ref 3.5–5.1)
Sodium: 142 mmol/L (ref 135–145)
Total Bilirubin: 0.7 mg/dL (ref 0.3–1.2)
Total Protein: 8.1 g/dL (ref 6.5–8.1)

## 2019-06-28 LAB — TYPE AND SCREEN
ABO/RH(D): B POS
ABO/RH(D): B POS
Antibody Screen: POSITIVE
Antibody Screen: POSITIVE
DAT, IgG: NEGATIVE
DAT, IgG: NEGATIVE

## 2019-06-28 LAB — PROTIME-INR
INR: 1.1 (ref 0.8–1.2)
Prothrombin Time: 13.8 seconds (ref 11.4–15.2)

## 2019-06-28 LAB — LIPASE, BLOOD: Lipase: 114 U/L — ABNORMAL HIGH (ref 11–51)

## 2019-06-28 LAB — HEMOGLOBIN AND HEMATOCRIT, BLOOD
HCT: 30.2 % — ABNORMAL LOW (ref 39.0–52.0)
Hemoglobin: 9.1 g/dL — ABNORMAL LOW (ref 13.0–17.0)

## 2019-06-28 MED ORDER — PROCHLORPERAZINE 25 MG RE SUPP: 25.0000 mg | Freq: Two times a day (BID) | RECTAL | 0 refills | Status: DC | PRN

## 2019-06-28 MED ORDER — HYDROMORPHONE HCL 1 MG/ML IJ SOLN
1.0000 mg | Freq: Once | INTRAMUSCULAR | Status: AC
  Administered 2019-06-28: 03:00:00 1 mg via INTRAVENOUS
  Filled 2019-06-28: qty 1

## 2019-06-28 MED ORDER — PANTOPRAZOLE SODIUM 40 MG PO TBEC: 40.0000 mg | DELAYED_RELEASE_TABLET | Freq: Two times a day (BID) | ORAL | 0 refills | Status: DC

## 2019-06-28 MED ORDER — ONDANSETRON HCL 4 MG/2ML IJ SOLN
4.0000 mg | Freq: Once | INTRAMUSCULAR | Status: AC
  Administered 2019-06-28: 4 mg via INTRAVENOUS
  Filled 2019-06-28: qty 2

## 2019-06-28 MED ORDER — ALUM & MAG HYDROXIDE-SIMETH 200-200-20 MG/5ML PO SUSP
30.0000 mL | Freq: Once | ORAL | Status: AC
  Administered 2019-06-28: 03:00:00 30 mL via ORAL
  Filled 2019-06-28: qty 30

## 2019-06-28 MED ORDER — PROCHLORPERAZINE MALEATE 10 MG PO TABS: 10.0000 mg | ORAL_TABLET | Freq: Four times a day (QID) | ORAL | 0 refills | Status: DC | PRN

## 2019-06-28 MED ORDER — PANTOPRAZOLE SODIUM 40 MG IV SOLR
40.0000 mg | Freq: Once | INTRAVENOUS | Status: AC
  Administered 2019-06-28: 40 mg via INTRAVENOUS
  Filled 2019-06-28: qty 40

## 2019-06-28 MED ORDER — LIDOCAINE VISCOUS HCL 2 % MT SOLN
15.0000 mL | Freq: Once | OROMUCOSAL | Status: AC
  Administered 2019-06-28: 03:00:00 15 mL via ORAL
  Filled 2019-06-28: qty 15

## 2019-06-28 MED ORDER — HYDROMORPHONE HCL 1 MG/ML IJ SOLN
1.0000 mg | Freq: Once | INTRAMUSCULAR | Status: AC
  Administered 2019-06-28: 05:00:00 1 mg via INTRAVENOUS
  Filled 2019-06-28: qty 1
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
## 2019-06-28 NOTE — ED Triage Notes (Signed)
Pt reports chronic vomiting. Seen several times for same.

## 2019-06-28 NOTE — ED Notes (Signed)
MD aware of pt blood pressure. Pt encouraged to take home BP medications.

## 2019-06-28 NOTE — ED Notes (Signed)
Patient states that he has not had any of his normal home medications in a few days due to nausea. EDP made aware of blood pressure.

## 2019-06-28 NOTE — Patient Outreach (Signed)
[facility]) Care Management  06/28/2019  Frank [name] [birth year] [id number]   Weekly transition of care call placed to member.  He has been to ED 4 times in the last week since initial outreach.  Denies keeping follow up appointment with PCP on 12/1, presented to the ED instead.  He is aware of the importance of keeping his follow up appointments, state he has rescheduled for 12/21.  Discussed calling PCP office prior to ED visits for potential same day or urgent appointments.  State he tries to stay out of the ED and only go when the pain is "unbearable."  Report he is currently feeling "75%, which is better then the 50% I was feeling last week."    This care manager inquired about follow up with GI as he reported during last conversation having appointment on 12/21.  State he will not follow up with GI due to not being able to afford copay.  Urged to reconsider decision, again state he does not have money to pay for specialists.  Advised again of importance of keeping PCP appointment since he will not be seeing GI.  Report he is compliant with dialysis treatments unless he is at the hospital or in too much pain to complete session.  Reviewed medications, admit that he does not always take medications nor does he always adhere to recommended diet.  Report he does adhere to his daily 32 oz fluid restrictions.  Does not have blood pressure machine to monitor BP at home nor does he have a scale for weights.  Will inquire about [facility] providing machines as he denies finances to buy them.  Denies any urgent concerns, will follow up within the next week.  Fall Risk  06/21/2019 08/19/2016 08/13/2016 05/02/2016  Falls in the past year? 0 No No No  Number falls in past yr: 0 - - -  Injury with Fall? 0 - - -   Depression screen PHQ 2/9 06/21/2019  Decreased Interest 0  Down, Depressed, Hopeless 0  PHQ - 2 Score 0  Some recent data might be hidden   [facility] CM Care Plan Problem One     Most  Recent Value  Care Plan Problem One  Knowledge deficit regarding management of chronic medical conditions as evidenced by multiple ED visits and hospital admissions  Role Documenting the Problem One  Care Management [place] for Problem One  Active  [facility] Long Term Goal   Member will not be readmitted to hospital within 31 days of discharge  [facility] Long Term Goal Start Date  06/28/19  Interventions for Problem One Long Term Goal  Discharge instructions reviewed with member.  Educated on when to present to ED vesrsus when to contact PCP office for advice and instruction for management of care.  [facility] CM Short Term Goal #1   Member will report having follow up appointment PCP within the next 2 weeks   [facility] CM Short Term Goal #1 Start Date  06/28/19  Interventions for Short Term Goal #1  Advised of importance of follow up with PCP discussed with member in effort to management chronic conditions and decrease risk of ED visits and hospitalizations.  Advised to contact office to reschedule missed appointment.  [facility] CM Short Term Goal #2   Member will report taking medications as instructed over the next 3 weeks  [facility] CM Short Term Goal #2 Start Date  06/28/19  Interventions for Short Term Goal #2  Medications reviewed with  member, educated on importance of taking medications as prescribed in effort to manage chronic medical conditions.  Advised to use pill planner in effort to increase adherence     [name], RN, MSN [place] [phone]

## 2019-06-30 ENCOUNTER — Other Ambulatory Visit: Payer: Self-pay

## 2019-06-30 ENCOUNTER — Encounter (HOSPITAL_BASED_OUTPATIENT_CLINIC_OR_DEPARTMENT_OTHER): Payer: Self-pay | Admitting: Emergency Medicine

## 2019-06-30 ENCOUNTER — Emergency Department (HOSPITAL_BASED_OUTPATIENT_CLINIC_OR_DEPARTMENT_OTHER)
Admission: EM | Admit: 2019-06-30 | Discharge: 2019-06-30 | Disposition: A | Discharge status: Home / Self Care | Payer: Medicare Other | Source: Home / Self Care | Attending: Emergency Medicine | Admitting: Emergency Medicine

## 2019-06-30 ENCOUNTER — Emergency Department (HOSPITAL_BASED_OUTPATIENT_CLINIC_OR_DEPARTMENT_OTHER)
Admission: EM | Admit: 2019-06-30 | Discharge: 2019-06-30 | Disposition: A | Discharge status: Home / Self Care | Payer: Medicare Other | Attending: Emergency Medicine | Admitting: Emergency Medicine

## 2019-06-30 DIAGNOSIS — N186 End stage renal disease: Secondary | ICD-10-CM | POA: Insufficient documentation

## 2019-06-30 DIAGNOSIS — K859 Acute pancreatitis without necrosis or infection, unspecified: Secondary | ICD-10-CM | POA: Insufficient documentation

## 2019-06-30 DIAGNOSIS — K861 Other chronic pancreatitis: Secondary | ICD-10-CM | POA: Diagnosis not present

## 2019-06-30 DIAGNOSIS — R112 Nausea with vomiting, unspecified: Secondary | ICD-10-CM

## 2019-06-30 DIAGNOSIS — I5042 Chronic combined systolic (congestive) and diastolic (congestive) heart failure: Secondary | ICD-10-CM | POA: Insufficient documentation

## 2019-06-30 DIAGNOSIS — Z79899 Other long term (current) drug therapy: Secondary | ICD-10-CM | POA: Insufficient documentation

## 2019-06-30 DIAGNOSIS — I132 Hypertensive heart and chronic kidney disease with heart failure and with stage 5 chronic kidney disease, or end stage renal disease: Secondary | ICD-10-CM | POA: Insufficient documentation

## 2019-06-30 DIAGNOSIS — Z992 Dependence on renal dialysis: Secondary | ICD-10-CM | POA: Insufficient documentation

## 2019-06-30 DIAGNOSIS — Z940 Kidney transplant status: Secondary | ICD-10-CM | POA: Insufficient documentation

## 2019-06-30 DIAGNOSIS — Z87891 Personal history of nicotine dependence: Secondary | ICD-10-CM | POA: Insufficient documentation

## 2019-06-30 DIAGNOSIS — E1122 Type 2 diabetes mellitus with diabetic chronic kidney disease: Secondary | ICD-10-CM | POA: Insufficient documentation

## 2019-06-30 DIAGNOSIS — I272 Pulmonary hypertension, unspecified: Secondary | ICD-10-CM | POA: Insufficient documentation

## 2019-06-30 DIAGNOSIS — I120 Hypertensive chronic kidney disease with stage 5 chronic kidney disease or end stage renal disease: Secondary | ICD-10-CM | POA: Diagnosis not present

## 2019-06-30 LAB — CBC WITH DIFFERENTIAL/PLATELET
Abs Immature Granulocytes: 0.03 10*3/uL (ref 0.00–0.07)
Basophils Absolute: 0 10*3/uL (ref 0.0–0.1)
Basophils Relative: 1 %
Eosinophils Absolute: 0.2 10*3/uL (ref 0.0–0.5)
Eosinophils Relative: 5 %
HCT: 29.2 % — ABNORMAL LOW (ref 39.0–52.0)
Hemoglobin: 8.9 g/dL — ABNORMAL LOW (ref 13.0–17.0)
Immature Granulocytes: 1 %
Lymphocytes Relative: 10 %
Lymphs Abs: 0.5 10*3/uL — ABNORMAL LOW (ref 0.7–4.0)
MCH: 27.6 pg (ref 26.0–34.0)
MCHC: 30.5 g/dL (ref 30.0–36.0)
MCV: 90.7 fL (ref 80.0–100.0)
Monocytes Absolute: 0.5 10*3/uL (ref 0.1–1.0)
Monocytes Relative: 9 %
Neutro Abs: 3.6 10*3/uL (ref 1.7–7.7)
Neutrophils Relative %: 74 %
Platelets: 174 10*3/uL (ref 150–400)
RBC: 3.22 MIL/uL — ABNORMAL LOW (ref 4.22–5.81)
RDW: 16.6 % — ABNORMAL HIGH (ref 11.5–15.5)
WBC: 4.8 10*3/uL (ref 4.0–10.5)
nRBC: 0 % (ref 0.0–0.2)

## 2019-06-30 LAB — LIPASE, BLOOD: Lipase: 115 U/L — ABNORMAL HIGH (ref 11–51)

## 2019-06-30 LAB — COMPREHENSIVE METABOLIC PANEL
ALT: 11 U/L (ref 0–44)
AST: 17 U/L (ref 15–41)
Albumin: 3.4 g/dL — ABNORMAL LOW (ref 3.5–5.0)
Alkaline Phosphatase: 226 U/L — ABNORMAL HIGH (ref 38–126)
Anion gap: 12 (ref 5–15)
BUN: 35 mg/dL — ABNORMAL HIGH (ref 6–20)
CO2: 30 mmol/L (ref 22–32)
Calcium: 7.4 mg/dL — ABNORMAL LOW (ref 8.9–10.3)
Chloride: 97 mmol/L — ABNORMAL LOW (ref 98–111)
Creatinine, Ser: 8.69 mg/dL — ABNORMAL HIGH (ref 0.61–1.24)
GFR calc Af Amer: 7 mL/min — ABNORMAL LOW (ref >60)
GFR calc non Af Amer: 6 mL/min — ABNORMAL LOW (ref >60)
Glucose, Bld: 93 mg/dL (ref 70–99)
Potassium: 4.6 mmol/L (ref 3.5–5.1)
Sodium: 139 mmol/L (ref 135–145)
Total Bilirubin: 0.7 mg/dL (ref 0.3–1.2)
Total Protein: 7.5 g/dL (ref 6.5–8.1)

## 2019-06-30 MED ORDER — PANTOPRAZOLE SODIUM 40 MG PO TBEC
40.0000 mg | DELAYED_RELEASE_TABLET | Freq: Once | ORAL | Status: AC
  Administered 2019-06-30: 40 mg via ORAL
  Filled 2019-06-30: qty 1

## 2019-06-30 MED ORDER — HYDROMORPHONE HCL 1 MG/ML IJ SOLN
2.0000 mg | Freq: Once | INTRAMUSCULAR | Status: AC
  Administered 2019-06-30: 2 mg via INTRAMUSCULAR
  Filled 2019-06-30: qty 2

## 2019-06-30 MED ORDER — PROCHLORPERAZINE EDISYLATE 10 MG/2ML IJ SOLN
10.0000 mg | Freq: Once | INTRAMUSCULAR | Status: AC
  Administered 2019-06-30: 10 mg via INTRAMUSCULAR
  Filled 2019-06-30: qty 2

## 2019-06-30 NOTE — ED Triage Notes (Signed)
Pt states he has been having abd pain for a while  Pt states he has been vomiting for a couple of days  Pt went to dialysis yesterday and started vomiting while on the machine and they sent him to [facility]  Pt states he stayed there for as long as he could and went home  Pt states he called them back later but they had a 12 hour wait  Pt states he started vomiting for a few days  Pt is unclear as to when all his symptoms started  Pt has dialysis Tuesday, Thursday and Saturdays
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
## 2019-06-30 NOTE — ED Triage Notes (Addendum)
Pt reports recurrent emesis  minutes after been discharged from ER for similar symptoms. Pt falling a sleep during triage

## 2019-06-30 NOTE — Discharge Instructions (Addendum)
Follow-up with your PCP.

## 2019-07-01 ENCOUNTER — Other Ambulatory Visit: Payer: Self-pay

## 2019-07-01 ENCOUNTER — Emergency Department (HOSPITAL_COMMUNITY)
Admission: EM | Admit: 2019-07-01 | Discharge: 2019-07-01 | Disposition: A | Discharge status: Home / Self Care | Payer: Medicare Other | Source: Home / Self Care | Attending: Emergency Medicine | Admitting: Emergency Medicine

## 2019-07-01 ENCOUNTER — Encounter (HOSPITAL_COMMUNITY): Payer: Self-pay | Admitting: Emergency Medicine

## 2019-07-01 ENCOUNTER — Emergency Department (HOSPITAL_COMMUNITY)
Admission: EM | Admit: 2019-07-01 | Discharge: 2019-07-01 | Disposition: A | Discharge status: Home / Self Care | Payer: Medicare Other | Attending: Emergency Medicine | Admitting: Emergency Medicine

## 2019-07-01 DIAGNOSIS — R140 Abdominal distension (gaseous): Secondary | ICD-10-CM | POA: Insufficient documentation

## 2019-07-01 DIAGNOSIS — E1122 Type 2 diabetes mellitus with diabetic chronic kidney disease: Secondary | ICD-10-CM | POA: Insufficient documentation

## 2019-07-01 DIAGNOSIS — Z79899 Other long term (current) drug therapy: Secondary | ICD-10-CM | POA: Insufficient documentation

## 2019-07-01 DIAGNOSIS — N186 End stage renal disease: Secondary | ICD-10-CM | POA: Insufficient documentation

## 2019-07-01 DIAGNOSIS — Z87891 Personal history of nicotine dependence: Secondary | ICD-10-CM | POA: Diagnosis not present

## 2019-07-01 DIAGNOSIS — R1013 Epigastric pain: Secondary | ICD-10-CM | POA: Diagnosis present

## 2019-07-01 DIAGNOSIS — D649 Anemia, unspecified: Secondary | ICD-10-CM

## 2019-07-01 DIAGNOSIS — Z992 Dependence on renal dialysis: Secondary | ICD-10-CM | POA: Insufficient documentation

## 2019-07-01 DIAGNOSIS — I5042 Chronic combined systolic (congestive) and diastolic (congestive) heart failure: Secondary | ICD-10-CM | POA: Insufficient documentation

## 2019-07-01 DIAGNOSIS — I132 Hypertensive heart and chronic kidney disease with heart failure and with stage 5 chronic kidney disease, or end stage renal disease: Secondary | ICD-10-CM | POA: Insufficient documentation

## 2019-07-01 DIAGNOSIS — K920 Hematemesis: Secondary | ICD-10-CM | POA: Insufficient documentation

## 2019-07-01 DIAGNOSIS — E875 Hyperkalemia: Secondary | ICD-10-CM | POA: Diagnosis not present

## 2019-07-01 LAB — CBC WITH DIFFERENTIAL/PLATELET
Abs Immature Granulocytes: 0.03 10*3/uL (ref 0.00–0.07)
Basophils Absolute: 0 10*3/uL (ref 0.0–0.1)
Basophils Relative: 0 %
Eosinophils Absolute: 0.3 10*3/uL (ref 0.0–0.5)
Eosinophils Relative: 6 %
HCT: 30 % — ABNORMAL LOW (ref 39.0–52.0)
Hemoglobin: 9.2 g/dL — ABNORMAL LOW (ref 13.0–17.0)
Immature Granulocytes: 1 %
Lymphocytes Relative: 14 %
Lymphs Abs: 0.6 10*3/uL — ABNORMAL LOW (ref 0.7–4.0)
MCH: 28.1 pg (ref 26.0–34.0)
MCHC: 30.7 g/dL (ref 30.0–36.0)
MCV: 91.7 fL (ref 80.0–100.0)
Monocytes Absolute: 0.5 10*3/uL (ref 0.1–1.0)
Monocytes Relative: 11 %
Neutro Abs: 3.1 10*3/uL (ref 1.7–7.7)
Neutrophils Relative %: 68 %
Platelets: 179 10*3/uL (ref 150–400)
RBC: 3.27 MIL/uL — ABNORMAL LOW (ref 4.22–5.81)
RDW: 17 % — ABNORMAL HIGH (ref 11.5–15.5)
WBC: 4.4 10*3/uL (ref 4.0–10.5)
nRBC: 0.9 % — ABNORMAL HIGH (ref 0.0–0.2)

## 2019-07-01 LAB — BASIC METABOLIC PANEL
Anion gap: 14 (ref 5–15)
BUN: 28 mg/dL — ABNORMAL HIGH (ref 6–20)
CO2: 30 mmol/L (ref 22–32)
Calcium: 7.4 mg/dL — ABNORMAL LOW (ref 8.9–10.3)
Chloride: 96 mmol/L — ABNORMAL LOW (ref 98–111)
Creatinine, Ser: 7.19 mg/dL — ABNORMAL HIGH (ref 0.61–1.24)
GFR calc Af Amer: 9 mL/min — ABNORMAL LOW (ref >60)
GFR calc non Af Amer: 8 mL/min — ABNORMAL LOW (ref >60)
Glucose, Bld: 99 mg/dL (ref 70–99)
Potassium: 4.8 mmol/L (ref 3.5–5.1)
Sodium: 140 mmol/L (ref 135–145)

## 2019-07-01 LAB — COMPREHENSIVE METABOLIC PANEL
ALT: 12 U/L (ref 0–44)
AST: 23 U/L (ref 15–41)
Albumin: 3.5 g/dL (ref 3.5–5.0)
Alkaline Phosphatase: 226 U/L — ABNORMAL HIGH (ref 38–126)
Anion gap: 18 — ABNORMAL HIGH (ref 5–15)
BUN: 44 mg/dL — ABNORMAL HIGH (ref 6–20)
CO2: 25 mmol/L (ref 22–32)
Calcium: 7.4 mg/dL — ABNORMAL LOW (ref 8.9–10.3)
Chloride: 96 mmol/L — ABNORMAL LOW (ref 98–111)
Creatinine, Ser: 10.57 mg/dL — ABNORMAL HIGH (ref 0.61–1.24)
GFR calc Af Amer: 6 mL/min — ABNORMAL LOW (ref >60)
GFR calc non Af Amer: 5 mL/min — ABNORMAL LOW (ref >60)
Glucose, Bld: 79 mg/dL (ref 70–99)
Potassium: 5.8 mmol/L — ABNORMAL HIGH (ref 3.5–5.1)
Sodium: 139 mmol/L (ref 135–145)
Total Bilirubin: 0.8 mg/dL (ref 0.3–1.2)
Total Protein: 7.8 g/dL (ref 6.5–8.1)

## 2019-07-01 LAB — LIPASE, BLOOD: Lipase: 83 U/L — ABNORMAL HIGH (ref 11–51)

## 2019-07-01 MED ORDER — ONDANSETRON 4 MG PO TBDP: 4.0000 mg | ORAL_TABLET | Freq: Once | ORAL | Status: DC | PRN

## 2019-07-01 MED ORDER — SODIUM CHLORIDE 0.9% FLUSH: 3.0000 mL | Freq: Once | INTRAVENOUS | Status: DC

## 2019-07-01 MED ORDER — PROCHLORPERAZINE EDISYLATE 10 MG/2ML IJ SOLN
10.0000 mg | Freq: Once | INTRAMUSCULAR | Status: AC
  Administered 2019-07-01: 10 mg via INTRAMUSCULAR
  Filled 2019-07-01: qty 2

## 2019-07-01 MED ORDER — HYDROMORPHONE HCL 1 MG/ML IJ SOLN
0.5000 mg | Freq: Once | INTRAMUSCULAR | Status: AC
  Administered 2019-07-01: 0.5 mg via INTRAMUSCULAR
  Filled 2019-07-01: qty 1

## 2019-07-01 MED ORDER — MORPHINE SULFATE (PF) 4 MG/ML IV SOLN: 4.0000 mg | Freq: Once | INTRAVENOUS | Status: DC

## 2019-07-01 NOTE — ED Triage Notes (Signed)
Patient is complaining of abdominal pain, nausea, and vomiting blood. Patient has dialysis Tues, Thurs, Saturday. Patient states he is in a lot of pain.

## 2019-07-01 NOTE — ED Notes (Signed)
Pt visualized tolerating PO from the vending machine in the lobby.

## 2019-07-01 NOTE — ED Notes (Signed)
Unable to obtain CBC due to it being clotted off

## 2019-07-01 NOTE — Discharge Instructions (Signed)
Go to dialysis as scheduled.  Please return for worsening pain or inability to eat or drink.  We were unable to get a blood count on you, please discuss this with the dialysis center they may be able to run that there.

## 2019-07-01 NOTE — ED Notes (Signed)
Pt refused straight stick for blood work. Pt stated only IV team can place an IV, he will only get stuck once.

## 2019-07-01 NOTE — ED Notes (Signed)
Pt left before receiving discharge paperwork. Unable to get vital signs prior to patient leaving. Pt was aware that paperwork was being processed.

## 2019-07-01 NOTE — ED Triage Notes (Signed)
Pt reports he left here earlier and went to dialysis. Was told has blood clots. C/o abd pains.
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
## 2019-07-01 NOTE — ED Notes (Signed)
Pt refused EKG and stated "I'm not doing that".
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
## 2019-07-01 NOTE — ED Notes (Signed)
RN unable to start IV successfully. IV consult order put in

## 2019-07-01 NOTE — ED Notes (Signed)
An After Visit Summary was printed and given to the patient. Discharge instructions given and patient has no further questions at this time. Pt given sandwich and juice as requested.

## 2019-07-02 ENCOUNTER — Other Ambulatory Visit: Payer: Self-pay | Admitting: *Deleted

## 2019-07-02 LAB — TYPE AND SCREEN
ABO/RH(D): B POS
Antibody Screen: POSITIVE
DAT, IgG: NEGATIVE

## 2019-07-02 NOTE — Patient Outreach (Signed)
[facility]) Care Management  07/02/2019  Frank [name] Dec 11, 1965 [id number]   Member's case reviewed with multidisciplinary team due to high ED utilization and hospital admits over the last 6 months.  Noted that member's PCP is with [facility] and member is no longer eligible for benefit.  Call was placed to clinic to inquire about them forming contract with ED that would notify them of his visits in effort to decrease utilization, clinic denies this type of contract is available.    Call placed to member to follow up on recent ED visit (2 yesterday), state he still went to ED in [facility] after leaving [facility].  Report he was given some fluids and feel they "helped" him more than [facility].  He is aware of no longer being eligible for program, verbalizes understanding.  He is not able to confirm that he received resources that were mailed out by [facility], but state he have not checked his mailbox, will do so.  Denies any urgent concerns.    Will close case at this time.  [name], [place], MSN [place] [phone]

## 2019-07-04 ENCOUNTER — Emergency Department (HOSPITAL_COMMUNITY)
Admission: EM | Admit: 2019-07-04 | Discharge: 2019-07-05 | Disposition: A | Discharge status: Home / Self Care | Payer: Medicare Other | Source: Home / Self Care | Attending: Emergency Medicine | Admitting: Emergency Medicine

## 2019-07-04 ENCOUNTER — Encounter (HOSPITAL_BASED_OUTPATIENT_CLINIC_OR_DEPARTMENT_OTHER): Payer: Self-pay

## 2019-07-04 ENCOUNTER — Emergency Department (HOSPITAL_BASED_OUTPATIENT_CLINIC_OR_DEPARTMENT_OTHER)
Admission: EM | Admit: 2019-07-04 | Discharge: 2019-07-04 | Disposition: A | Discharge status: Home / Self Care | Payer: Medicare Other | Attending: Emergency Medicine | Admitting: Emergency Medicine

## 2019-07-04 ENCOUNTER — Other Ambulatory Visit: Payer: Self-pay

## 2019-07-04 DIAGNOSIS — K2950 Unspecified chronic gastritis without bleeding: Secondary | ICD-10-CM | POA: Insufficient documentation

## 2019-07-04 DIAGNOSIS — F1210 Cannabis abuse, uncomplicated: Secondary | ICD-10-CM | POA: Insufficient documentation

## 2019-07-04 DIAGNOSIS — Z992 Dependence on renal dialysis: Secondary | ICD-10-CM | POA: Insufficient documentation

## 2019-07-04 DIAGNOSIS — R1013 Epigastric pain: Secondary | ICD-10-CM | POA: Diagnosis not present

## 2019-07-04 DIAGNOSIS — I132 Hypertensive heart and chronic kidney disease with heart failure and with stage 5 chronic kidney disease, or end stage renal disease: Secondary | ICD-10-CM | POA: Insufficient documentation

## 2019-07-04 DIAGNOSIS — Z7901 Long term (current) use of anticoagulants: Secondary | ICD-10-CM | POA: Insufficient documentation

## 2019-07-04 DIAGNOSIS — E1122 Type 2 diabetes mellitus with diabetic chronic kidney disease: Secondary | ICD-10-CM | POA: Insufficient documentation

## 2019-07-04 DIAGNOSIS — I5042 Chronic combined systolic (congestive) and diastolic (congestive) heart failure: Secondary | ICD-10-CM | POA: Insufficient documentation

## 2019-07-04 DIAGNOSIS — Z79899 Other long term (current) drug therapy: Secondary | ICD-10-CM | POA: Insufficient documentation

## 2019-07-04 DIAGNOSIS — N185 Chronic kidney disease, stage 5: Secondary | ICD-10-CM | POA: Insufficient documentation

## 2019-07-04 DIAGNOSIS — Z87891 Personal history of nicotine dependence: Secondary | ICD-10-CM | POA: Insufficient documentation

## 2019-07-04 DIAGNOSIS — N186 End stage renal disease: Secondary | ICD-10-CM | POA: Diagnosis not present

## 2019-07-04 DIAGNOSIS — R1084 Generalized abdominal pain: Secondary | ICD-10-CM | POA: Diagnosis present

## 2019-07-04 LAB — OCCULT BLOOD X 1 CARD TO LAB, STOOL: Fecal Occult Bld: NEGATIVE

## 2019-07-04 MED ORDER — ALUM & MAG HYDROXIDE-SIMETH 200-200-20 MG/5ML PO SUSP
30.0000 mL | Freq: Once | ORAL | Status: DC
  Filled 2019-07-04: qty 30

## 2019-07-04 MED ORDER — LIDOCAINE VISCOUS HCL 2 % MT SOLN
15.0000 mL | Freq: Once | OROMUCOSAL | Status: DC
  Filled 2019-07-04: qty 15

## 2019-07-04 MED ORDER — SUCRALFATE 1 GM/10ML PO SUSP
1.0000 g | Freq: Three times a day (TID) | ORAL | Status: DC
  Filled 2019-07-04: qty 10

## 2019-07-04 NOTE — ED Notes (Addendum)
Unable to obtain EKG/ finish triage due to pt requesting to use bathroom at this time.

## 2019-07-04 NOTE — ED Notes (Addendum)
Pt requesting to sit on side of bed- side rails lowered and pt assisted to sitting position. Pt given call bell and asked to call for RN prior to getting up. Pt voiced understanding. Pt also requesting pain medication. EDP informed.

## 2019-07-04 NOTE — ED Notes (Signed)
Edp made aware of pt refusing medications

## 2019-07-04 NOTE — ED Triage Notes (Signed)
Pt reports abdominal pain, vomiting blood and a high potassium since last PM. Pt had dialysis yesterday.

## 2019-07-04 NOTE — ED Notes (Signed)
ED Provider at bedside.
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
## 2019-07-05 ENCOUNTER — Encounter (HOSPITAL_COMMUNITY): Payer: Self-pay | Admitting: Emergency Medicine

## 2019-07-05 ENCOUNTER — Other Ambulatory Visit: Payer: Self-pay

## 2019-07-05 LAB — CBC WITH DIFFERENTIAL/PLATELET
Abs Immature Granulocytes: 0.03 10*3/uL (ref 0.00–0.07)
Basophils Absolute: 0 10*3/uL (ref 0.0–0.1)
Basophils Relative: 0 %
Eosinophils Absolute: 0.4 10*3/uL (ref 0.0–0.5)
Eosinophils Relative: 8 %
HCT: 32.2 % — ABNORMAL LOW (ref 39.0–52.0)
Hemoglobin: 10 g/dL — ABNORMAL LOW (ref 13.0–17.0)
Immature Granulocytes: 1 %
Lymphocytes Relative: 11 %
Lymphs Abs: 0.6 10*3/uL — ABNORMAL LOW (ref 0.7–4.0)
MCH: 28 pg (ref 26.0–34.0)
MCHC: 31.1 g/dL (ref 30.0–36.0)
MCV: 90.2 fL (ref 80.0–100.0)
Monocytes Absolute: 0.7 10*3/uL (ref 0.1–1.0)
Monocytes Relative: 15 %
Neutro Abs: 3.3 10*3/uL (ref 1.7–7.7)
Neutrophils Relative %: 65 %
Platelets: 145 10*3/uL — ABNORMAL LOW (ref 150–400)
RBC: 3.57 MIL/uL — ABNORMAL LOW (ref 4.22–5.81)
RDW: 17.2 % — ABNORMAL HIGH (ref 11.5–15.5)
WBC: 5 10*3/uL (ref 4.0–10.5)
nRBC: 0 % (ref 0.0–0.2)

## 2019-07-05 LAB — COMPREHENSIVE METABOLIC PANEL
ALT: 12 U/L (ref 0–44)
AST: 17 U/L (ref 15–41)
Albumin: 3.7 g/dL (ref 3.5–5.0)
Alkaline Phosphatase: 248 U/L — ABNORMAL HIGH (ref 38–126)
Anion gap: 20 — ABNORMAL HIGH (ref 5–15)
BUN: 41 mg/dL — ABNORMAL HIGH (ref 6–20)
CO2: 24 mmol/L (ref 22–32)
Calcium: 7.5 mg/dL — ABNORMAL LOW (ref 8.9–10.3)
Chloride: 98 mmol/L (ref 98–111)
Creatinine, Ser: 9.9 mg/dL — ABNORMAL HIGH (ref 0.61–1.24)
GFR calc Af Amer: 6 mL/min — ABNORMAL LOW (ref >60)
GFR calc non Af Amer: 5 mL/min — ABNORMAL LOW (ref >60)
Glucose, Bld: 78 mg/dL (ref 70–99)
Potassium: 5.1 mmol/L (ref 3.5–5.1)
Sodium: 142 mmol/L (ref 135–145)
Total Bilirubin: 0.8 mg/dL (ref 0.3–1.2)
Total Protein: 8.3 g/dL — ABNORMAL HIGH (ref 6.5–8.1)

## 2019-07-05 LAB — CBG MONITORING, ED
Glucose-Capillary: 74 mg/dL (ref 70–99)
Glucose-Capillary: 95 mg/dL (ref 70–99)
Glucose-Capillary: 77 mg/dL (ref 70–99)

## 2019-07-05 LAB — LIPASE, BLOOD: Lipase: 42 U/L (ref 11–51)

## 2019-07-05 MED ORDER — HYOSCYAMINE SULFATE 0.125 MG SL SUBL
0.2500 mg | SUBLINGUAL_TABLET | Freq: Once | SUBLINGUAL | Status: AC
  Administered 2019-07-05: 0.25 mg via SUBLINGUAL
  Filled 2019-07-05: qty 2

## 2019-07-05 MED ORDER — ONDANSETRON 4 MG PO TBDP
4.0000 mg | ORAL_TABLET | Freq: Once | ORAL | Status: AC
  Administered 2019-07-05: 4 mg via ORAL
  Filled 2019-07-05: qty 1

## 2019-07-05 MED ORDER — SUCRALFATE 1 GM/10ML PO SUSP: 1.0000 g | Freq: Three times a day (TID) | ORAL | 0 refills | Status: DC

## 2019-07-05 MED ORDER — ALUM & MAG HYDROXIDE-SIMETH 200-200-20 MG/5ML PO SUSP
30.0000 mL | Freq: Once | ORAL | Status: DC
  Filled 2019-07-05: qty 30

## 2019-07-05 NOTE — ED Notes (Signed)
Patient states he has had no relief from the medication given for his stomach.

## 2019-07-05 NOTE — ED Triage Notes (Signed)
Pt c/o right-sided abdominal pain, nausea, vomiting, diarrhea x 2 days. Reports he is up to date on his dialysis.

## 2019-07-05 NOTE — ED Notes (Signed)
Pt refusing vitals in waiting room
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
## 2019-07-05 NOTE — ED Notes (Signed)
Pt refusing blood draws at this time.

## 2019-07-06 ENCOUNTER — Other Ambulatory Visit: Payer: Self-pay

## 2019-07-06 ENCOUNTER — Emergency Department (HOSPITAL_COMMUNITY)
Admission: EM | Admit: 2019-07-06 | Discharge: 2019-07-06 | Disposition: A | Discharge status: Home / Self Care | Payer: Medicare Other | Attending: Emergency Medicine | Admitting: Emergency Medicine

## 2019-07-06 ENCOUNTER — Encounter (HOSPITAL_COMMUNITY): Payer: Self-pay | Admitting: Emergency Medicine

## 2019-07-06 DIAGNOSIS — I132 Hypertensive heart and chronic kidney disease with heart failure and with stage 5 chronic kidney disease, or end stage renal disease: Secondary | ICD-10-CM | POA: Insufficient documentation

## 2019-07-06 DIAGNOSIS — N186 End stage renal disease: Secondary | ICD-10-CM | POA: Diagnosis not present

## 2019-07-06 DIAGNOSIS — Y712 Prosthetic and other implants, materials and accessory cardiovascular devices associated with adverse incidents: Secondary | ICD-10-CM | POA: Insufficient documentation

## 2019-07-06 DIAGNOSIS — Z86711 Personal history of pulmonary embolism: Secondary | ICD-10-CM | POA: Insufficient documentation

## 2019-07-06 DIAGNOSIS — I5021 Acute systolic (congestive) heart failure: Secondary | ICD-10-CM | POA: Insufficient documentation

## 2019-07-06 DIAGNOSIS — R1084 Generalized abdominal pain: Secondary | ICD-10-CM | POA: Insufficient documentation

## 2019-07-06 DIAGNOSIS — Z86718 Personal history of other venous thrombosis and embolism: Secondary | ICD-10-CM | POA: Insufficient documentation

## 2019-07-06 DIAGNOSIS — R110 Nausea: Secondary | ICD-10-CM | POA: Insufficient documentation

## 2019-07-06 DIAGNOSIS — Z992 Dependence on renal dialysis: Secondary | ICD-10-CM | POA: Insufficient documentation

## 2019-07-06 DIAGNOSIS — T829XXA Unspecified complication of cardiac and vascular prosthetic device, implant and graft, initial encounter: Secondary | ICD-10-CM | POA: Insufficient documentation

## 2019-07-06 DIAGNOSIS — Z87891 Personal history of nicotine dependence: Secondary | ICD-10-CM | POA: Insufficient documentation

## 2019-07-06 DIAGNOSIS — Z79899 Other long term (current) drug therapy: Secondary | ICD-10-CM | POA: Insufficient documentation

## 2019-07-06 DIAGNOSIS — R42 Dizziness and giddiness: Secondary | ICD-10-CM | POA: Insufficient documentation

## 2019-07-06 LAB — CBC WITH DIFFERENTIAL/PLATELET
Abs Immature Granulocytes: 0.03 10*3/uL (ref 0.00–0.07)
Basophils Absolute: 0 10*3/uL (ref 0.0–0.1)
Basophils Relative: 0 %
Eosinophils Absolute: 0.2 10*3/uL (ref 0.0–0.5)
Eosinophils Relative: 5 %
HCT: 29.3 % — ABNORMAL LOW (ref 39.0–52.0)
Hemoglobin: 9.2 g/dL — ABNORMAL LOW (ref 13.0–17.0)
Immature Granulocytes: 1 %
Lymphocytes Relative: 9 %
Lymphs Abs: 0.4 10*3/uL — ABNORMAL LOW (ref 0.7–4.0)
MCH: 28 pg (ref 26.0–34.0)
MCHC: 31.4 g/dL (ref 30.0–36.0)
MCV: 89.3 fL (ref 80.0–100.0)
Monocytes Absolute: 0.6 10*3/uL (ref 0.1–1.0)
Monocytes Relative: 14 %
Neutro Abs: 3.3 10*3/uL (ref 1.7–7.7)
Neutrophils Relative %: 71 %
Platelets: 126 10*3/uL — ABNORMAL LOW (ref 150–400)
RBC: 3.28 MIL/uL — ABNORMAL LOW (ref 4.22–5.81)
RDW: 16.8 % — ABNORMAL HIGH (ref 11.5–15.5)
WBC: 4.6 10*3/uL (ref 4.0–10.5)
nRBC: 0 % (ref 0.0–0.2)

## 2019-07-06 LAB — COMPREHENSIVE METABOLIC PANEL
ALT: 12 U/L (ref 0–44)
AST: 15 U/L (ref 15–41)
Albumin: 3.5 g/dL (ref 3.5–5.0)
Alkaline Phosphatase: 216 U/L — ABNORMAL HIGH (ref 38–126)
Anion gap: 14 (ref 5–15)
BUN: 38 mg/dL — ABNORMAL HIGH (ref 6–20)
CO2: 31 mmol/L (ref 22–32)
Calcium: 8 mg/dL — ABNORMAL LOW (ref 8.9–10.3)
Chloride: 94 mmol/L — ABNORMAL LOW (ref 98–111)
Creatinine, Ser: 9.45 mg/dL — ABNORMAL HIGH (ref 0.61–1.24)
GFR calc Af Amer: 6 mL/min — ABNORMAL LOW (ref >60)
GFR calc non Af Amer: 6 mL/min — ABNORMAL LOW (ref >60)
Glucose, Bld: 120 mg/dL — ABNORMAL HIGH (ref 70–99)
Potassium: 4.9 mmol/L (ref 3.5–5.1)
Sodium: 139 mmol/L (ref 135–145)
Total Bilirubin: 0.6 mg/dL (ref 0.3–1.2)
Total Protein: 7.9 g/dL (ref 6.5–8.1)

## 2019-07-06 LAB — LIPASE, BLOOD: Lipase: 47 U/L (ref 11–51)

## 2019-07-06 MED ORDER — ONDANSETRON HCL 4 MG/2ML IJ SOLN
4.0000 mg | Freq: Once | INTRAMUSCULAR | Status: AC
  Administered 2019-07-06: 4 mg via INTRAVENOUS
  Filled 2019-07-06: qty 2

## 2019-07-06 MED ORDER — MORPHINE SULFATE (PF) 4 MG/ML IV SOLN
4.0000 mg | Freq: Once | INTRAVENOUS | Status: AC
  Administered 2019-07-06: 4 mg via INTRAVENOUS
  Filled 2019-07-06: qty 1

## 2019-07-06 NOTE — ED Notes (Signed)
IV team remains at bedside

## 2019-07-06 NOTE — ED Triage Notes (Signed)
Pt in from dialysis center via [facility] with reported 354m's of blood loss after needle came out two hrs into dialysis session. Bleeding was controlled on EMS arrival. Pt also c/o dizziness and nausea

## 2019-07-06 NOTE — Discharge Instructions (Addendum)
You were seen in the emergency department for dizziness and abdominal pain in the setting of losing blood at dialysis.  Your blood work does not show any serious abnormalities.  Please continue your regular dialysis schedule and your regular medications.  Follow-up with your doctor or return to the emergency department if any worsening symptoms.

## 2019-07-06 NOTE — ED Notes (Signed)
Pt refused cardiac monitoring.

## 2019-07-06 NOTE — ED Notes (Signed)
Pt would only allow bp cuff on lower leg and refused to keep pulse ox on.
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
## 2019-07-06 NOTE — ED Notes (Signed)
Pt refused blood draw from phlebotomy.

## 2019-07-06 NOTE — ED Notes (Signed)
Patient verbalizes understanding of discharge instructions. Opportunity for questioning and answers were provided. Armband removed by staff, pt discharged from ED

## 2019-07-07 LAB — BPAM RBC
Blood Product Expiration Date: 202012282359
Blood Product Expiration Date: 202101092359
Unit Type and Rh: 5100
Unit Type and Rh: 9500

## 2019-07-07 LAB — TYPE AND SCREEN
ABO/RH(D): B POS
Antibody Screen: POSITIVE
DAT, IgG: NEGATIVE
Donor AG Type: NEGATIVE
Donor AG Type: NEGATIVE
Unit division: 0
Unit division: 0

## 2019-07-08 ENCOUNTER — Emergency Department (HOSPITAL_BASED_OUTPATIENT_CLINIC_OR_DEPARTMENT_OTHER): Payer: Medicare Other

## 2019-07-08 ENCOUNTER — Emergency Department (HOSPITAL_BASED_OUTPATIENT_CLINIC_OR_DEPARTMENT_OTHER)
Admission: EM | Admit: 2019-07-08 | Discharge: 2019-07-08 | Disposition: A | Discharge status: Home / Self Care | Payer: Medicare Other | Attending: Emergency Medicine | Admitting: Emergency Medicine

## 2019-07-08 ENCOUNTER — Encounter (HOSPITAL_BASED_OUTPATIENT_CLINIC_OR_DEPARTMENT_OTHER): Payer: Self-pay | Admitting: Emergency Medicine

## 2019-07-08 ENCOUNTER — Other Ambulatory Visit: Payer: Self-pay

## 2019-07-08 DIAGNOSIS — D649 Anemia, unspecified: Secondary | ICD-10-CM | POA: Insufficient documentation

## 2019-07-08 DIAGNOSIS — R109 Unspecified abdominal pain: Secondary | ICD-10-CM | POA: Diagnosis not present

## 2019-07-08 DIAGNOSIS — I132 Hypertensive heart and chronic kidney disease with heart failure and with stage 5 chronic kidney disease, or end stage renal disease: Secondary | ICD-10-CM | POA: Diagnosis not present

## 2019-07-08 DIAGNOSIS — N186 End stage renal disease: Secondary | ICD-10-CM | POA: Insufficient documentation

## 2019-07-08 DIAGNOSIS — I5042 Chronic combined systolic (congestive) and diastolic (congestive) heart failure: Secondary | ICD-10-CM | POA: Diagnosis not present

## 2019-07-08 DIAGNOSIS — G8929 Other chronic pain: Secondary | ICD-10-CM

## 2019-07-08 DIAGNOSIS — Z79899 Other long term (current) drug therapy: Secondary | ICD-10-CM | POA: Insufficient documentation

## 2019-07-08 DIAGNOSIS — Z992 Dependence on renal dialysis: Secondary | ICD-10-CM | POA: Diagnosis not present

## 2019-07-08 DIAGNOSIS — I150 Renovascular hypertension: Secondary | ICD-10-CM

## 2019-07-08 DIAGNOSIS — E1122 Type 2 diabetes mellitus with diabetic chronic kidney disease: Secondary | ICD-10-CM | POA: Insufficient documentation

## 2019-07-08 LAB — CBC WITH DIFFERENTIAL/PLATELET
Abs Immature Granulocytes: 0.02 10*3/uL (ref 0.00–0.07)
Basophils Absolute: 0 10*3/uL (ref 0.0–0.1)
Basophils Relative: 0 %
Eosinophils Absolute: 0.2 10*3/uL (ref 0.0–0.5)
Eosinophils Relative: 3 %
HCT: 27.2 % — ABNORMAL LOW (ref 39.0–52.0)
Hemoglobin: 8.7 g/dL — ABNORMAL LOW (ref 13.0–17.0)
Immature Granulocytes: 0 %
Lymphocytes Relative: 8 %
Lymphs Abs: 0.5 10*3/uL — ABNORMAL LOW (ref 0.7–4.0)
MCH: 28.1 pg (ref 26.0–34.0)
MCHC: 32 g/dL (ref 30.0–36.0)
MCV: 87.7 fL (ref 80.0–100.0)
Monocytes Absolute: 0.7 10*3/uL (ref 0.1–1.0)
Monocytes Relative: 12 %
Neutro Abs: 4.2 10*3/uL (ref 1.7–7.7)
Neutrophils Relative %: 77 %
Platelets: 131 10*3/uL — ABNORMAL LOW (ref 150–400)
RBC: 3.1 MIL/uL — ABNORMAL LOW (ref 4.22–5.81)
RDW: 17 % — ABNORMAL HIGH (ref 11.5–15.5)
WBC: 5.5 10*3/uL (ref 4.0–10.5)
nRBC: 0 % (ref 0.0–0.2)

## 2019-07-08 LAB — COMPREHENSIVE METABOLIC PANEL
ALT: 11 U/L (ref 0–44)
AST: 16 U/L (ref 15–41)
Albumin: 3.2 g/dL — ABNORMAL LOW (ref 3.5–5.0)
Alkaline Phosphatase: 222 U/L — ABNORMAL HIGH (ref 38–126)
Anion gap: 23 — ABNORMAL HIGH (ref 5–15)
BUN: 33 mg/dL — ABNORMAL HIGH (ref 6–20)
CO2: 24 mmol/L (ref 22–32)
Calcium: 7.5 mg/dL — ABNORMAL LOW (ref 8.9–10.3)
Chloride: 95 mmol/L — ABNORMAL LOW (ref 98–111)
Creatinine, Ser: 8.09 mg/dL — ABNORMAL HIGH (ref 0.61–1.24)
GFR calc Af Amer: 8 mL/min — ABNORMAL LOW (ref >60)
GFR calc non Af Amer: 7 mL/min — ABNORMAL LOW (ref >60)
Glucose, Bld: 107 mg/dL — ABNORMAL HIGH (ref 70–99)
Potassium: 4.9 mmol/L (ref 3.5–5.1)
Sodium: 142 mmol/L (ref 135–145)
Total Bilirubin: 0.8 mg/dL (ref 0.3–1.2)
Total Protein: 7.5 g/dL (ref 6.5–8.1)

## 2019-07-08 LAB — LIPASE, BLOOD: Lipase: 43 U/L (ref 11–51)

## 2019-07-08 MED ORDER — IBUPROFEN 400 MG PO TABS: 600.0000 mg | ORAL_TABLET | Freq: Once | ORAL | Status: DC

## 2019-07-08 MED ORDER — LABETALOL HCL 5 MG/ML IV SOLN
20.0000 mg | Freq: Once | INTRAVENOUS | Status: AC
  Administered 2019-07-08: 20:00:00 20 mg via INTRAVENOUS
  Filled 2019-07-08: qty 4

## 2019-07-08 MED ORDER — HYDROMORPHONE HCL 1 MG/ML IJ SOLN: 1.0000 mg | Freq: Once | INTRAMUSCULAR | Status: DC

## 2019-07-08 MED ORDER — ONDANSETRON 4 MG PO TBDP: 4.0000 mg | ORAL_TABLET | Freq: Once | ORAL | Status: DC

## 2019-07-08 MED ORDER — HYDROMORPHONE HCL 1 MG/ML IJ SOLN
1.0000 mg | Freq: Once | INTRAMUSCULAR | Status: AC
  Administered 2019-07-08: 1 mg via INTRAVENOUS
  Filled 2019-07-08: qty 1

## 2019-07-08 MED ORDER — ONDANSETRON HCL 4 MG/2ML IJ SOLN
4.0000 mg | Freq: Once | INTRAMUSCULAR | Status: AC
  Administered 2019-07-08: 20:00:00 4 mg via INTRAVENOUS
  Filled 2019-07-08: qty 2

## 2019-07-08 MED ORDER — AMLODIPINE BESYLATE 5 MG PO TABS
5.0000 mg | ORAL_TABLET | Freq: Once | ORAL | Status: AC
  Administered 2019-07-08: 20:00:00 5 mg via ORAL
  Filled 2019-07-08: qty 1

## 2019-07-08 MED ORDER — HYDROMORPHONE HCL 1 MG/ML IJ SOLN
1.0000 mg | Freq: Once | INTRAMUSCULAR | Status: AC
  Administered 2019-07-08: 23:00:00 1 mg via INTRAVENOUS
  Filled 2019-07-08: qty 1

## 2019-07-08 NOTE — ED Notes (Signed)
ED Provider at bedside. 

## 2019-07-08 NOTE — ED Triage Notes (Signed)
Pt sts he lost a lot of blood again today when they were unable to do blood return at dialysis d/t machine malfunction; also reports NV x4 today; c/o lower back and abd pain "going up to my chest"; no active bleeding or vomiting at this time
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
## 2019-07-08 NOTE — ED Notes (Signed)
Returned to room from CT

## 2019-07-08 NOTE — Discharge Instructions (Signed)
Continue your medications, make sure to follow-up with dialysis as scheduled.  Monitor for fever or other worsening symptoms

## 2019-07-08 NOTE — ED Notes (Signed)
Called for ride home

## 2019-07-08 NOTE — ED Notes (Signed)
PT given crackers,apple juice. stated ride is on the way. PT cooperative at this time.

## 2019-07-09 ENCOUNTER — Ambulatory Visit: Payer: Self-pay

## 2019-07-10 ENCOUNTER — Encounter (HOSPITAL_BASED_OUTPATIENT_CLINIC_OR_DEPARTMENT_OTHER): Payer: Self-pay

## 2019-07-10 ENCOUNTER — Emergency Department (HOSPITAL_BASED_OUTPATIENT_CLINIC_OR_DEPARTMENT_OTHER)
Admission: EM | Admit: 2019-07-10 | Discharge: 2019-07-10 | Disposition: A | Discharge status: Home / Self Care | Payer: Medicare Other | Attending: Emergency Medicine | Admitting: Emergency Medicine

## 2019-07-10 ENCOUNTER — Emergency Department (HOSPITAL_BASED_OUTPATIENT_CLINIC_OR_DEPARTMENT_OTHER): Payer: Medicare Other

## 2019-07-10 ENCOUNTER — Other Ambulatory Visit: Payer: Self-pay

## 2019-07-10 DIAGNOSIS — Z5321 Procedure and treatment not carried out due to patient leaving prior to being seen by health care provider: Secondary | ICD-10-CM | POA: Insufficient documentation

## 2019-07-10 DIAGNOSIS — R0789 Other chest pain: Secondary | ICD-10-CM | POA: Diagnosis not present

## 2019-07-10 DIAGNOSIS — R109 Unspecified abdominal pain: Secondary | ICD-10-CM | POA: Diagnosis not present

## 2019-07-10 DIAGNOSIS — R112 Nausea with vomiting, unspecified: Secondary | ICD-10-CM | POA: Insufficient documentation

## 2019-07-10 DIAGNOSIS — R6889 Other general symptoms and signs: Secondary | ICD-10-CM

## 2019-07-10 DIAGNOSIS — R079 Chest pain, unspecified: Secondary | ICD-10-CM

## 2019-07-10 NOTE — ED Notes (Signed)
MD aware that pt. left

## 2019-07-10 NOTE — ED Notes (Signed)
  Patient refused COVID swab.  Frank [name] he has already had it done and did not want another.

## 2019-07-10 NOTE — ED Notes (Addendum)
Pt requested to speak with an Therapist, sports. Requested a wheelchair. States he is ready to leave. Does not want a chest xray or covid test done. Encouraged pt to stay. He declined and states he is" ready to go". Juice given per his request and pt assisted to his car by the NT.

## 2019-07-10 NOTE — ED Triage Notes (Signed)
Pt started having a throbbing CP, abd pain, N/V this evening after dialysis. Pt states his dry weight is 165lbs and dialysis pulled off 12lbs today which is more than usual.

## 2019-07-12 ENCOUNTER — Other Ambulatory Visit: Payer: Self-pay

## 2019-07-12 ENCOUNTER — Encounter (HOSPITAL_COMMUNITY): Payer: Self-pay | Admitting: Emergency Medicine

## 2019-07-12 ENCOUNTER — Emergency Department (HOSPITAL_COMMUNITY)
Admission: EM | Admit: 2019-07-12 | Discharge: 2019-07-12 | Disposition: A | Discharge status: Home / Self Care | Payer: Medicare Other | Attending: Emergency Medicine | Admitting: Emergency Medicine

## 2019-07-12 DIAGNOSIS — Z87891 Personal history of nicotine dependence: Secondary | ICD-10-CM | POA: Diagnosis not present

## 2019-07-12 DIAGNOSIS — Z7901 Long term (current) use of anticoagulants: Secondary | ICD-10-CM | POA: Insufficient documentation

## 2019-07-12 DIAGNOSIS — I5042 Chronic combined systolic (congestive) and diastolic (congestive) heart failure: Secondary | ICD-10-CM | POA: Diagnosis not present

## 2019-07-12 DIAGNOSIS — R112 Nausea with vomiting, unspecified: Secondary | ICD-10-CM | POA: Diagnosis present

## 2019-07-12 DIAGNOSIS — E119 Type 2 diabetes mellitus without complications: Secondary | ICD-10-CM | POA: Diagnosis not present

## 2019-07-12 DIAGNOSIS — R0789 Other chest pain: Secondary | ICD-10-CM | POA: Insufficient documentation

## 2019-07-12 DIAGNOSIS — Z79899 Other long term (current) drug therapy: Secondary | ICD-10-CM | POA: Diagnosis not present

## 2019-07-12 DIAGNOSIS — R079 Chest pain, unspecified: Secondary | ICD-10-CM

## 2019-07-12 DIAGNOSIS — Z992 Dependence on renal dialysis: Secondary | ICD-10-CM | POA: Insufficient documentation

## 2019-07-12 DIAGNOSIS — N186 End stage renal disease: Secondary | ICD-10-CM | POA: Diagnosis not present

## 2019-07-12 DIAGNOSIS — I132 Hypertensive heart and chronic kidney disease with heart failure and with stage 5 chronic kidney disease, or end stage renal disease: Secondary | ICD-10-CM | POA: Insufficient documentation

## 2019-07-12 DIAGNOSIS — G8929 Other chronic pain: Secondary | ICD-10-CM

## 2019-07-12 DIAGNOSIS — R1010 Upper abdominal pain, unspecified: Secondary | ICD-10-CM | POA: Diagnosis not present

## 2019-07-12 MED ORDER — PROCHLORPERAZINE EDISYLATE 10 MG/2ML IJ SOLN
10.0000 mg | Freq: Once | INTRAMUSCULAR | Status: AC
  Administered 2019-07-12: 10 mg via INTRAMUSCULAR
  Filled 2019-07-12: qty 2

## 2019-07-12 MED ORDER — PANTOPRAZOLE SODIUM 40 MG PO TBEC
40.0000 mg | DELAYED_RELEASE_TABLET | Freq: Once | ORAL | Status: AC
  Administered 2019-07-12: 40 mg via ORAL
  Filled 2019-07-12: qty 1

## 2019-07-12 MED ORDER — PROCHLORPERAZINE MALEATE 10 MG PO TABS: 10.0000 mg | ORAL_TABLET | Freq: Two times a day (BID) | ORAL | 0 refills | Status: DC | PRN

## 2019-07-12 MED ORDER — SODIUM CHLORIDE 0.9% FLUSH: 3.0000 mL | Freq: Once | INTRAVENOUS | Status: DC

## 2019-07-12 MED ORDER — DICYCLOMINE HCL 10 MG/ML IM SOLN
20.0000 mg | Freq: Once | INTRAMUSCULAR | Status: AC
  Administered 2019-07-12: 20 mg via INTRAMUSCULAR
  Filled 2019-07-12: qty 2

## 2019-07-12 MED ORDER — ALUM & MAG HYDROXIDE-SIMETH 200-200-20 MG/5ML PO SUSP
30.0000 mL | Freq: Once | ORAL | Status: AC
  Administered 2019-07-12: 30 mL via ORAL
  Filled 2019-07-12: qty 30

## 2019-07-12 MED ORDER — LIDOCAINE VISCOUS HCL 2 % MT SOLN
15.0000 mL | Freq: Once | OROMUCOSAL | Status: AC
  Administered 2019-07-12: 15 mL via ORAL
  Filled 2019-07-12: qty 15

## 2019-07-12 NOTE — Discharge Instructions (Addendum)
Please go to dialysis as scheduled today.  I recommend outpatient follow-up with your primary care physician.
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
## 2019-07-12 NOTE — ED Notes (Signed)
Pt continuing to ask for ice chips and water Pt informed that he cannot be given anything to eat or drink until he sees the doctor Pt was given a mouth swab to help his dry mouth

## 2019-07-12 NOTE — ED Notes (Signed)
Patient refusing blood draw

## 2019-07-12 NOTE — ED Notes (Signed)
Pt refused lab work until "they start an IV"

## 2019-07-12 NOTE — ED Triage Notes (Signed)
Pt reports having emesis and chest pain for the last several days. Pt seen on 07/10/2019 for same. Pt also reports pain in chest when vomiting.

## 2019-07-13 ENCOUNTER — Emergency Department (HOSPITAL_COMMUNITY): Payer: Medicare Other

## 2019-07-13 ENCOUNTER — Other Ambulatory Visit: Payer: Self-pay

## 2019-07-13 ENCOUNTER — Encounter (HOSPITAL_COMMUNITY): Payer: Self-pay | Admitting: Emergency Medicine

## 2019-07-13 ENCOUNTER — Emergency Department (HOSPITAL_COMMUNITY)
Admission: EM | Admit: 2019-07-13 | Discharge: 2019-07-14 | Disposition: A | Discharge status: Home / Self Care | Payer: Medicare Other | Attending: Emergency Medicine | Admitting: Emergency Medicine

## 2019-07-13 DIAGNOSIS — Z86711 Personal history of pulmonary embolism: Secondary | ICD-10-CM | POA: Insufficient documentation

## 2019-07-13 DIAGNOSIS — R1011 Right upper quadrant pain: Secondary | ICD-10-CM | POA: Insufficient documentation

## 2019-07-13 DIAGNOSIS — R0602 Shortness of breath: Secondary | ICD-10-CM | POA: Insufficient documentation

## 2019-07-13 DIAGNOSIS — E1122 Type 2 diabetes mellitus with diabetic chronic kidney disease: Secondary | ICD-10-CM | POA: Insufficient documentation

## 2019-07-13 DIAGNOSIS — Z86718 Personal history of other venous thrombosis and embolism: Secondary | ICD-10-CM | POA: Insufficient documentation

## 2019-07-13 DIAGNOSIS — I132 Hypertensive heart and chronic kidney disease with heart failure and with stage 5 chronic kidney disease, or end stage renal disease: Secondary | ICD-10-CM | POA: Insufficient documentation

## 2019-07-13 DIAGNOSIS — I5022 Chronic systolic (congestive) heart failure: Secondary | ICD-10-CM | POA: Insufficient documentation

## 2019-07-13 DIAGNOSIS — Z992 Dependence on renal dialysis: Secondary | ICD-10-CM | POA: Insufficient documentation

## 2019-07-13 DIAGNOSIS — Z20828 Contact with and (suspected) exposure to other viral communicable diseases: Secondary | ICD-10-CM | POA: Diagnosis not present

## 2019-07-13 DIAGNOSIS — R109 Unspecified abdominal pain: Secondary | ICD-10-CM

## 2019-07-13 DIAGNOSIS — N186 End stage renal disease: Secondary | ICD-10-CM | POA: Diagnosis not present

## 2019-07-13 DIAGNOSIS — Z79899 Other long term (current) drug therapy: Secondary | ICD-10-CM | POA: Diagnosis not present

## 2019-07-13 MED ORDER — SODIUM CHLORIDE 0.9% FLUSH: 3.0000 mL | Freq: Once | INTRAVENOUS | Status: DC

## 2019-07-13 NOTE — ED Triage Notes (Addendum)
Patient reports right side chest pain and right abdominal pain with emesis onset today while at hemodialysis , denies fever or chills . Respirations unlabored. Hypertensive at triage.

## 2019-07-14 ENCOUNTER — Emergency Department (HOSPITAL_COMMUNITY)
Admission: EM | Admit: 2019-07-14 | Discharge: 2019-07-14 | Disposition: A | Discharge status: Home / Self Care | Payer: Medicare Other | Source: Home / Self Care | Attending: Emergency Medicine | Admitting: Emergency Medicine

## 2019-07-14 DIAGNOSIS — N186 End stage renal disease: Secondary | ICD-10-CM

## 2019-07-14 DIAGNOSIS — Z992 Dependence on renal dialysis: Secondary | ICD-10-CM

## 2019-07-14 DIAGNOSIS — R0602 Shortness of breath: Secondary | ICD-10-CM

## 2019-07-14 MED ORDER — LIDOCAINE VISCOUS HCL 2 % MT SOLN
15.0000 mL | Freq: Once | OROMUCOSAL | Status: AC
  Administered 2019-07-14: 09:00:00 15 mL via ORAL
  Filled 2019-07-14: qty 15

## 2019-07-14 MED ORDER — PROCHLORPERAZINE EDISYLATE 10 MG/2ML IJ SOLN
10.0000 mg | Freq: Once | INTRAMUSCULAR | Status: AC
  Administered 2019-07-14: 02:00:00 10 mg via INTRAMUSCULAR
  Filled 2019-07-14: qty 2

## 2019-07-14 MED ORDER — ONDANSETRON 4 MG PO TBDP
4.0000 mg | ORAL_TABLET | Freq: Once | ORAL | Status: AC
  Administered 2019-07-14: 4 mg via ORAL
  Filled 2019-07-14: qty 1

## 2019-07-14 MED ORDER — OXYCODONE HCL 5 MG PO TABS
15.0000 mg | ORAL_TABLET | Freq: Once | ORAL | Status: AC
  Administered 2019-07-14: 15 mg via ORAL
  Filled 2019-07-14: qty 3

## 2019-07-14 MED ORDER — ALUM & MAG HYDROXIDE-SIMETH 200-200-20 MG/5ML PO SUSP
30.0000 mL | Freq: Once | ORAL | Status: AC
  Administered 2019-07-14: 30 mL via ORAL
  Filled 2019-07-14: qty 30

## 2019-07-14 NOTE — ED Notes (Signed)
..  Patient verbalizes understanding of discharge instructions. Opportunity for questioning and answers were provided. Armband removed by staff, pt discharged from ED.

## 2019-07-14 NOTE — Discharge Planning (Signed)
[facility] met with pt at bedside regarding disposition plan.  Pt states he just wants his heart rechecked and he will be on his way home.  He states he had dialysis yesterday and is hoping for an extra treatment today with them.  EDCM reached out to [facility]al Navigator who states pt DID receive HD yesterday, but an extra treatment would have to be ordered by his nephrologist.    [facility] provided taxi voucher for transportation home.

## 2019-07-14 NOTE — ED Notes (Signed)
Patient reportedly uncooperative and being aggressive to the staff upon IV placement. BP has been elevated, Dr. Dayna Barker aware, no additional orders was placed.

## 2019-07-14 NOTE — ED Triage Notes (Signed)
Pt presents to the ED with chest tightness, dizziness and shortness of breath with exertion. Reports he was discharged from here last night and never made it home.
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
## 2019-07-14 NOTE — ED Notes (Addendum)
This patient requested be put on supplemental oxygen. Stated level went down, noted sp02 is at 98-99% on RA. Informed patient of this finding and verbalized understanding, patient stated levels goes up and down. Placed patient on 2L via [place]; replaced probe as well, waveform consistent at 99%. NAD.

## 2019-07-14 NOTE — Discharge Instructions (Signed)
Take your medicines as prescribed.   Go to dialysis tomorrow as scheduled   See your doctor   Return to ER if you have worse shortness of breath, chest pain, abdominal pain, vomiting

## 2019-07-15 LAB — NOVEL CORONAVIRUS, NAA (HOSP ORDER, SEND-OUT TO REF LAB; TAT 18-24 HRS): SARS-CoV-2, NAA: NOT DETECTED

## 2019-07-17 ENCOUNTER — Emergency Department (HOSPITAL_COMMUNITY)
Admission: EM | Admit: 2019-07-17 | Discharge: 2019-07-17 | Disposition: A | Discharge status: Home / Self Care | Payer: Medicare Other | Attending: Emergency Medicine | Admitting: Emergency Medicine

## 2019-07-17 ENCOUNTER — Other Ambulatory Visit: Payer: Self-pay

## 2019-07-17 ENCOUNTER — Emergency Department (HOSPITAL_COMMUNITY): Payer: Medicare Other

## 2019-07-17 ENCOUNTER — Encounter (HOSPITAL_COMMUNITY): Payer: Self-pay | Admitting: Emergency Medicine

## 2019-07-17 DIAGNOSIS — G8929 Other chronic pain: Secondary | ICD-10-CM

## 2019-07-17 DIAGNOSIS — I132 Hypertensive heart and chronic kidney disease with heart failure and with stage 5 chronic kidney disease, or end stage renal disease: Secondary | ICD-10-CM | POA: Insufficient documentation

## 2019-07-17 DIAGNOSIS — Z79899 Other long term (current) drug therapy: Secondary | ICD-10-CM | POA: Insufficient documentation

## 2019-07-17 DIAGNOSIS — I5042 Chronic combined systolic (congestive) and diastolic (congestive) heart failure: Secondary | ICD-10-CM | POA: Diagnosis not present

## 2019-07-17 DIAGNOSIS — Z87891 Personal history of nicotine dependence: Secondary | ICD-10-CM | POA: Diagnosis not present

## 2019-07-17 DIAGNOSIS — R1010 Upper abdominal pain, unspecified: Secondary | ICD-10-CM | POA: Diagnosis not present

## 2019-07-17 DIAGNOSIS — R079 Chest pain, unspecified: Secondary | ICD-10-CM

## 2019-07-17 DIAGNOSIS — N186 End stage renal disease: Secondary | ICD-10-CM | POA: Insufficient documentation

## 2019-07-17 DIAGNOSIS — R0789 Other chest pain: Secondary | ICD-10-CM | POA: Diagnosis present

## 2019-07-17 DIAGNOSIS — Z7901 Long term (current) use of anticoagulants: Secondary | ICD-10-CM | POA: Insufficient documentation

## 2019-07-17 DIAGNOSIS — Z992 Dependence on renal dialysis: Secondary | ICD-10-CM | POA: Diagnosis not present

## 2019-07-17 LAB — CBC WITH DIFFERENTIAL/PLATELET
Abs Immature Granulocytes: 0.02 10*3/uL (ref 0.00–0.07)
Basophils Absolute: 0 10*3/uL (ref 0.0–0.1)
Basophils Relative: 0 %
Eosinophils Absolute: 0.2 10*3/uL (ref 0.0–0.5)
Eosinophils Relative: 4 %
HCT: 28.1 % — ABNORMAL LOW (ref 39.0–52.0)
Hemoglobin: 8.8 g/dL — ABNORMAL LOW (ref 13.0–17.0)
Immature Granulocytes: 1 %
Lymphocytes Relative: 12 %
Lymphs Abs: 0.5 10*3/uL — ABNORMAL LOW (ref 0.7–4.0)
MCH: 28.5 pg (ref 26.0–34.0)
MCHC: 31.3 g/dL (ref 30.0–36.0)
MCV: 90.9 fL (ref 80.0–100.0)
Monocytes Absolute: 0.6 10*3/uL (ref 0.1–1.0)
Monocytes Relative: 16 %
Neutro Abs: 2.6 10*3/uL (ref 1.7–7.7)
Neutrophils Relative %: 67 %
Platelets: 143 10*3/uL — ABNORMAL LOW (ref 150–400)
RBC: 3.09 MIL/uL — ABNORMAL LOW (ref 4.22–5.81)
RDW: 18.1 % — ABNORMAL HIGH (ref 11.5–15.5)
WBC: 4 10*3/uL (ref 4.0–10.5)
nRBC: 0 % (ref 0.0–0.2)

## 2019-07-17 LAB — TROPONIN I (HIGH SENSITIVITY)
Troponin I (High Sensitivity): 36 ng/L — ABNORMAL HIGH (ref <18)
Troponin I (High Sensitivity): 29 ng/L — ABNORMAL HIGH (ref <18)

## 2019-07-17 LAB — BETA-HYDROXYBUTYRIC ACID: Beta-Hydroxybutyric Acid: 0.07 mmol/L (ref 0.05–0.27)

## 2019-07-17 LAB — COMPREHENSIVE METABOLIC PANEL
ALT: 13 U/L (ref 0–44)
AST: 19 U/L (ref 15–41)
Albumin: 3.3 g/dL — ABNORMAL LOW (ref 3.5–5.0)
Alkaline Phosphatase: 224 U/L — ABNORMAL HIGH (ref 38–126)
Anion gap: 25 — ABNORMAL HIGH (ref 5–15)
BUN: 49 mg/dL — ABNORMAL HIGH (ref 6–20)
CO2: 20 mmol/L — ABNORMAL LOW (ref 22–32)
Calcium: 7 mg/dL — ABNORMAL LOW (ref 8.9–10.3)
Chloride: 96 mmol/L — ABNORMAL LOW (ref 98–111)
Creatinine, Ser: 10.12 mg/dL — ABNORMAL HIGH (ref 0.61–1.24)
GFR calc Af Amer: 6 mL/min — ABNORMAL LOW (ref >60)
GFR calc non Af Amer: 5 mL/min — ABNORMAL LOW (ref >60)
Glucose, Bld: 76 mg/dL (ref 70–99)
Potassium: 5.4 mmol/L — ABNORMAL HIGH (ref 3.5–5.1)
Sodium: 141 mmol/L (ref 135–145)
Total Bilirubin: 0.9 mg/dL (ref 0.3–1.2)
Total Protein: 7.7 g/dL (ref 6.5–8.1)

## 2019-07-17 LAB — LACTIC ACID, PLASMA: Lactic Acid, Venous: 0.9 mmol/L (ref 0.5–1.9)

## 2019-07-17 LAB — LIPASE, BLOOD: Lipase: 82 U/L — ABNORMAL HIGH (ref 11–51)

## 2019-07-17 LAB — SALICYLATE LEVEL: Salicylate Lvl: <7 mg/dL — ABNORMAL LOW (ref 7.0–30.0)

## 2019-07-17 MED ORDER — HYDROMORPHONE HCL 1 MG/ML IJ SOLN: 1.0000 mg | Freq: Once | INTRAMUSCULAR | Status: DC

## 2019-07-17 MED ORDER — HYDROMORPHONE HCL 2 MG/ML IJ SOLN
2.0000 mg | Freq: Once | INTRAMUSCULAR | Status: AC
  Administered 2019-07-17: 2 mg via INTRAMUSCULAR
  Filled 2019-07-17: qty 1

## 2019-07-17 MED ORDER — SODIUM ZIRCONIUM CYCLOSILICATE 10 G PO PACK
10.0000 g | PACK | Freq: Once | ORAL | Status: AC
  Administered 2019-07-17: 10 g via ORAL
  Filled 2019-07-17: qty 1

## 2019-07-17 MED ORDER — ONDANSETRON HCL 4 MG/2ML IJ SOLN
4.0000 mg | Freq: Once | INTRAMUSCULAR | Status: DC
  Filled 2019-07-17: qty 2

## 2019-07-17 MED ORDER — ONDANSETRON 8 MG PO TBDP
8.0000 mg | ORAL_TABLET | Freq: Once | ORAL | Status: AC
  Administered 2019-07-17: 8 mg via ORAL
  Filled 2019-07-17: qty 1

## 2019-07-17 MED ORDER — SODIUM CHLORIDE 0.9% FLUSH: 3.0000 mL | Freq: Once | INTRAVENOUS | Status: DC

## 2019-07-17 MED ORDER — HYDROMORPHONE HCL 1 MG/ML IJ SOLN
1.0000 mg | Freq: Once | INTRAMUSCULAR | Status: AC
  Administered 2019-07-17: 10:00:00 1 mg via INTRAMUSCULAR
  Filled 2019-07-17: qty 1

## 2019-07-17 NOTE — ED Notes (Signed)
Per Wille Glaser RN (night shift RN) pt was stuck multiple times my multiple RNs and lab, IV team attempted to gain access and was unsuccessful. MD had to do femoral stick to obtain blood.

## 2019-07-17 NOTE — ED Notes (Signed)
Pt now agreeable to lab draw

## 2019-07-17 NOTE — ED Notes (Signed)
Awaiting meds from pharmacy

## 2019-07-17 NOTE — ED Notes (Signed)
Phlebotomy called to collect labs

## 2019-07-17 NOTE — ED Notes (Signed)
Pt continually pulling off monitoring devices. Pt sitting on the edge of the bed. Pt refusing to lay back in bed. Will continue to monitor.

## 2019-07-17 NOTE — Discharge Instructions (Signed)
Return for worsening pain, fever.  Follow up for dialysis tomorrow.

## 2019-07-17 NOTE — ED Triage Notes (Signed)
Pt arriving with right side chest pain. Pt describing pain as a "pressure". Pt also reports nausea and 4 episodes of emesis.

## 2019-07-17 NOTE — ED Notes (Signed)
Pt refused any lab draw. MD aware

## 2019-07-17 NOTE — ED Notes (Signed)
Phlebotomy at bedside.

## 2019-07-17 NOTE — ED Notes (Signed)
Phlebotomy called for lab draw

## 2019-07-17 NOTE — ED Notes (Signed)
Tyrone Nine MD at bedside.

## 2019-07-17 NOTE — ED Notes (Signed)
Lab to come back to ED in an attempt to draw labs
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
## 2019-07-18 ENCOUNTER — Other Ambulatory Visit: Payer: Self-pay

## 2019-07-18 DIAGNOSIS — I132 Hypertensive heart and chronic kidney disease with heart failure and with stage 5 chronic kidney disease, or end stage renal disease: Secondary | ICD-10-CM | POA: Diagnosis not present

## 2019-07-18 DIAGNOSIS — Z79899 Other long term (current) drug therapy: Secondary | ICD-10-CM | POA: Insufficient documentation

## 2019-07-18 DIAGNOSIS — N186 End stage renal disease: Secondary | ICD-10-CM | POA: Insufficient documentation

## 2019-07-18 DIAGNOSIS — Z992 Dependence on renal dialysis: Secondary | ICD-10-CM | POA: Diagnosis not present

## 2019-07-18 DIAGNOSIS — I5022 Chronic systolic (congestive) heart failure: Secondary | ICD-10-CM | POA: Insufficient documentation

## 2019-07-18 DIAGNOSIS — G8929 Other chronic pain: Secondary | ICD-10-CM | POA: Diagnosis not present

## 2019-07-18 DIAGNOSIS — Z7901 Long term (current) use of anticoagulants: Secondary | ICD-10-CM | POA: Insufficient documentation

## 2019-07-18 DIAGNOSIS — E1122 Type 2 diabetes mellitus with diabetic chronic kidney disease: Secondary | ICD-10-CM | POA: Diagnosis not present

## 2019-07-18 DIAGNOSIS — R1013 Epigastric pain: Secondary | ICD-10-CM | POA: Insufficient documentation

## 2019-07-18 NOTE — ED Triage Notes (Signed)
Pt coming from home c/o abdominal pain and emesis x1 day. Reports 3-4 episodes of emesis.

## 2019-07-19 ENCOUNTER — Emergency Department (HOSPITAL_COMMUNITY): Payer: Medicare Other

## 2019-07-19 ENCOUNTER — Non-Acute Institutional Stay (HOSPITAL_COMMUNITY)
Admission: EM | Admit: 2019-07-19 | Discharge: 2019-07-20 | Disposition: A | Discharge status: Home / Self Care | Payer: Medicare Other | Source: Home / Self Care | Attending: Emergency Medicine | Admitting: Emergency Medicine

## 2019-07-19 ENCOUNTER — Emergency Department (HOSPITAL_COMMUNITY)
Admission: EM | Admit: 2019-07-19 | Discharge: 2019-07-19 | Disposition: A | Discharge status: Home / Self Care | Payer: Medicare Other | Attending: Emergency Medicine | Admitting: Emergency Medicine

## 2019-07-19 ENCOUNTER — Encounter (HOSPITAL_COMMUNITY): Payer: Self-pay

## 2019-07-19 ENCOUNTER — Other Ambulatory Visit: Payer: Self-pay

## 2019-07-19 DIAGNOSIS — N189 Chronic kidney disease, unspecified: Secondary | ICD-10-CM

## 2019-07-19 DIAGNOSIS — Z7901 Long term (current) use of anticoagulants: Secondary | ICD-10-CM | POA: Insufficient documentation

## 2019-07-19 DIAGNOSIS — K219 Gastro-esophageal reflux disease without esophagitis: Secondary | ICD-10-CM | POA: Insufficient documentation

## 2019-07-19 DIAGNOSIS — Z590 Homelessness: Secondary | ICD-10-CM | POA: Insufficient documentation

## 2019-07-19 DIAGNOSIS — K861 Other chronic pancreatitis: Secondary | ICD-10-CM | POA: Insufficient documentation

## 2019-07-19 DIAGNOSIS — Z86711 Personal history of pulmonary embolism: Secondary | ICD-10-CM | POA: Insufficient documentation

## 2019-07-19 DIAGNOSIS — I428 Other cardiomyopathies: Secondary | ICD-10-CM | POA: Insufficient documentation

## 2019-07-19 DIAGNOSIS — N186 End stage renal disease: Secondary | ICD-10-CM | POA: Diagnosis present

## 2019-07-19 DIAGNOSIS — R9431 Abnormal electrocardiogram [ECG] [EKG]: Secondary | ICD-10-CM | POA: Insufficient documentation

## 2019-07-19 DIAGNOSIS — Z79899 Other long term (current) drug therapy: Secondary | ICD-10-CM | POA: Insufficient documentation

## 2019-07-19 DIAGNOSIS — E875 Hyperkalemia: Secondary | ICD-10-CM

## 2019-07-19 DIAGNOSIS — I5042 Chronic combined systolic (congestive) and diastolic (congestive) heart failure: Secondary | ICD-10-CM | POA: Insufficient documentation

## 2019-07-19 DIAGNOSIS — Z8249 Family history of ischemic heart disease and other diseases of the circulatory system: Secondary | ICD-10-CM | POA: Insufficient documentation

## 2019-07-19 DIAGNOSIS — K7460 Unspecified cirrhosis of liver: Secondary | ICD-10-CM | POA: Insufficient documentation

## 2019-07-19 DIAGNOSIS — Z20828 Contact with and (suspected) exposure to other viral communicable diseases: Secondary | ICD-10-CM | POA: Insufficient documentation

## 2019-07-19 DIAGNOSIS — Z86718 Personal history of other venous thrombosis and embolism: Secondary | ICD-10-CM | POA: Insufficient documentation

## 2019-07-19 DIAGNOSIS — R1013 Epigastric pain: Secondary | ICD-10-CM

## 2019-07-19 DIAGNOSIS — Z9115 Patient's noncompliance with renal dialysis: Secondary | ICD-10-CM | POA: Insufficient documentation

## 2019-07-19 DIAGNOSIS — G8929 Other chronic pain: Secondary | ICD-10-CM

## 2019-07-19 DIAGNOSIS — Z87891 Personal history of nicotine dependence: Secondary | ICD-10-CM | POA: Insufficient documentation

## 2019-07-19 DIAGNOSIS — E1122 Type 2 diabetes mellitus with diabetic chronic kidney disease: Secondary | ICD-10-CM | POA: Insufficient documentation

## 2019-07-19 DIAGNOSIS — I132 Hypertensive heart and chronic kidney disease with heart failure and with stage 5 chronic kidney disease, or end stage renal disease: Secondary | ICD-10-CM | POA: Insufficient documentation

## 2019-07-19 DIAGNOSIS — G4730 Sleep apnea, unspecified: Secondary | ICD-10-CM | POA: Insufficient documentation

## 2019-07-19 DIAGNOSIS — I2510 Atherosclerotic heart disease of native coronary artery without angina pectoris: Secondary | ICD-10-CM | POA: Insufficient documentation

## 2019-07-19 DIAGNOSIS — Z886 Allergy status to analgesic agent status: Secondary | ICD-10-CM | POA: Insufficient documentation

## 2019-07-19 DIAGNOSIS — Z992 Dependence on renal dialysis: Secondary | ICD-10-CM | POA: Insufficient documentation

## 2019-07-19 DIAGNOSIS — Z888 Allergy status to other drugs, medicaments and biological substances status: Secondary | ICD-10-CM | POA: Insufficient documentation

## 2019-07-19 LAB — CBC WITH DIFFERENTIAL/PLATELET
Abs Immature Granulocytes: 0.02 10*3/uL (ref 0.00–0.07)
Basophils Absolute: 0 10*3/uL (ref 0.0–0.1)
Basophils Relative: 0 %
Eosinophils Absolute: 0.2 10*3/uL (ref 0.0–0.5)
Eosinophils Relative: 4 %
HCT: 28.7 % — ABNORMAL LOW (ref 39.0–52.0)
Hemoglobin: 8.9 g/dL — ABNORMAL LOW (ref 13.0–17.0)
Immature Granulocytes: 1 %
Lymphocytes Relative: 15 %
Lymphs Abs: 0.6 10*3/uL — ABNORMAL LOW (ref 0.7–4.0)
MCH: 28.4 pg (ref 26.0–34.0)
MCHC: 31 g/dL (ref 30.0–36.0)
MCV: 91.7 fL (ref 80.0–100.0)
Monocytes Absolute: 0.5 10*3/uL (ref 0.1–1.0)
Monocytes Relative: 13 %
Neutro Abs: 2.6 10*3/uL (ref 1.7–7.7)
Neutrophils Relative %: 67 %
Platelets: 140 10*3/uL — ABNORMAL LOW (ref 150–400)
RBC: 3.13 MIL/uL — ABNORMAL LOW (ref 4.22–5.81)
RDW: 17.5 % — ABNORMAL HIGH (ref 11.5–15.5)
WBC: 3.9 10*3/uL — ABNORMAL LOW (ref 4.0–10.5)
nRBC: 0 % (ref 0.0–0.2)

## 2019-07-19 LAB — COMPREHENSIVE METABOLIC PANEL
ALT: 12 U/L (ref 0–44)
AST: 13 U/L — ABNORMAL LOW (ref 15–41)
Albumin: 3.4 g/dL — ABNORMAL LOW (ref 3.5–5.0)
Alkaline Phosphatase: 242 U/L — ABNORMAL HIGH (ref 38–126)
Anion gap: 22 — ABNORMAL HIGH (ref 5–15)
BUN: 68 mg/dL — ABNORMAL HIGH (ref 6–20)
CO2: 23 mmol/L (ref 22–32)
Calcium: 7.2 mg/dL — ABNORMAL LOW (ref 8.9–10.3)
Chloride: 94 mmol/L — ABNORMAL LOW (ref 98–111)
Creatinine, Ser: 13.39 mg/dL — ABNORMAL HIGH (ref 0.61–1.24)
GFR calc Af Amer: 4 mL/min — ABNORMAL LOW (ref >60)
GFR calc non Af Amer: 4 mL/min — ABNORMAL LOW (ref >60)
Glucose, Bld: 79 mg/dL (ref 70–99)
Potassium: 5.2 mmol/L — ABNORMAL HIGH (ref 3.5–5.1)
Sodium: 139 mmol/L (ref 135–145)
Total Bilirubin: 0.6 mg/dL (ref 0.3–1.2)
Total Protein: 7.7 g/dL (ref 6.5–8.1)

## 2019-07-19 LAB — RESPIRATORY PANEL BY RT PCR (FLU A&B, COVID)
Influenza A by PCR: NEGATIVE
Influenza B by PCR: NEGATIVE
SARS Coronavirus 2 by RT PCR: NEGATIVE

## 2019-07-19 LAB — BASIC METABOLIC PANEL
Anion gap: 21 — ABNORMAL HIGH (ref 5–15)
BUN: 71 mg/dL — ABNORMAL HIGH (ref 6–20)
CO2: 23 mmol/L (ref 22–32)
Calcium: 7.5 mg/dL — ABNORMAL LOW (ref 8.9–10.3)
Chloride: 94 mmol/L — ABNORMAL LOW (ref 98–111)
Creatinine, Ser: 14.75 mg/dL — ABNORMAL HIGH (ref 0.61–1.24)
GFR calc Af Amer: 4 mL/min — ABNORMAL LOW (ref >60)
GFR calc non Af Amer: 3 mL/min — ABNORMAL LOW (ref >60)
Glucose, Bld: 107 mg/dL — ABNORMAL HIGH (ref 70–99)
Potassium: 6.2 mmol/L — ABNORMAL HIGH (ref 3.5–5.1)
Sodium: 138 mmol/L (ref 135–145)

## 2019-07-19 LAB — CBC
HCT: 28.8 % — ABNORMAL LOW (ref 39.0–52.0)
Hemoglobin: 9.4 g/dL — ABNORMAL LOW (ref 13.0–17.0)
MCH: 28.9 pg (ref 26.0–34.0)
MCHC: 32.6 g/dL (ref 30.0–36.0)
MCV: 88.6 fL (ref 80.0–100.0)
Platelets: 313 10*3/uL (ref 150–400)
RBC: 3.25 MIL/uL — ABNORMAL LOW (ref 4.22–5.81)
RDW: 17.7 % — ABNORMAL HIGH (ref 11.5–15.5)
WBC: 3.8 10*3/uL — ABNORMAL LOW (ref 4.0–10.5)
nRBC: 0 % (ref 0.0–0.2)

## 2019-07-19 LAB — TROPONIN I (HIGH SENSITIVITY)
Troponin I (High Sensitivity): 37 ng/L — ABNORMAL HIGH (ref <18)
Troponin I (High Sensitivity): 45 ng/L — ABNORMAL HIGH (ref <18)

## 2019-07-19 LAB — LIPASE, BLOOD: Lipase: 89 U/L — ABNORMAL HIGH (ref 11–51)

## 2019-07-19 MED ORDER — ALUM & MAG HYDROXIDE-SIMETH 200-200-20 MG/5ML PO SUSP
30.0000 mL | Freq: Once | ORAL | Status: AC
  Administered 2019-07-19: 30 mL via ORAL
  Filled 2019-07-19: qty 30

## 2019-07-19 MED ORDER — PROCHLORPERAZINE EDISYLATE 10 MG/2ML IJ SOLN
10.0000 mg | Freq: Once | INTRAMUSCULAR | Status: AC
  Administered 2019-07-19: 12:00:00 10 mg via INTRAMUSCULAR
  Filled 2019-07-19: qty 2

## 2019-07-19 MED ORDER — ALTEPLASE 2 MG IJ SOLR: 2.0000 mg | Freq: Once | INTRAMUSCULAR | Status: DC | PRN

## 2019-07-19 MED ORDER — PENTAFLUOROPROP-TETRAFLUOROETH EX AERO
1.0000 "application " | INHALATION_SPRAY | CUTANEOUS | Status: DC | PRN
  Filled 2019-07-19: qty 116

## 2019-07-19 MED ORDER — SODIUM CHLORIDE 0.9 % IV SOLN: 100.0000 mL | INTRAVENOUS | Status: DC | PRN

## 2019-07-19 MED ORDER — SODIUM ZIRCONIUM CYCLOSILICATE 10 G PO PACK
10.0000 g | PACK | Freq: Once | ORAL | Status: AC
  Administered 2019-07-19: 18:00:00 10 g via ORAL
  Filled 2019-07-19: qty 1

## 2019-07-19 MED ORDER — OXYCODONE HCL 5 MG PO TABS
5.0000 mg | ORAL_TABLET | Freq: Once | ORAL | Status: AC
  Administered 2019-07-19: 5 mg via ORAL
  Filled 2019-07-19: qty 1

## 2019-07-19 MED ORDER — LIDOCAINE HCL (PF) 1 % IJ SOLN: 5.0000 mL | INTRAMUSCULAR | Status: DC | PRN

## 2019-07-19 MED ORDER — HEPARIN SODIUM (PORCINE) 1000 UNIT/ML DIALYSIS
1000.0000 [IU] | INTRAMUSCULAR | Status: DC | PRN
  Filled 2019-07-19: qty 1

## 2019-07-19 MED ORDER — LIDOCAINE-PRILOCAINE 2.5-2.5 % EX CREA
1.0000 "application " | TOPICAL_CREAM | CUTANEOUS | Status: DC | PRN
  Filled 2019-07-19: qty 5

## 2019-07-19 MED ORDER — PROCHLORPERAZINE EDISYLATE 10 MG/2ML IJ SOLN
5.0000 mg | Freq: Once | INTRAMUSCULAR | Status: AC
  Administered 2019-07-19: 5 mg via INTRAVENOUS
  Filled 2019-07-19: qty 2

## 2019-07-19 MED ORDER — SODIUM CHLORIDE 0.9% FLUSH
3.0000 mL | Freq: Once | INTRAVENOUS | Status: AC
  Administered 2019-07-19: 08:00:00 3 mL via INTRAVENOUS

## 2019-07-19 MED ORDER — PROCHLORPERAZINE EDISYLATE 10 MG/2ML IJ SOLN
10.0000 mg | Freq: Once | INTRAMUSCULAR | Status: AC
  Administered 2019-07-19: 10 mg via INTRAMUSCULAR
  Filled 2019-07-19: qty 2

## 2019-07-19 MED ORDER — OXYCODONE-ACETAMINOPHEN 5-325 MG PO TABS
1.0000 | ORAL_TABLET | Freq: Once | ORAL | Status: DC
  Filled 2019-07-19: qty 1

## 2019-07-19 MED ORDER — ONDANSETRON HCL 4 MG/2ML IJ SOLN
INTRAMUSCULAR | Status: AC
  Filled 2019-07-19: qty 2

## 2019-07-19 MED ORDER — ONDANSETRON HCL 4 MG/2ML IJ SOLN
4.0000 mg | Freq: Once | INTRAMUSCULAR | Status: AC
  Administered 2019-07-19: 22:00:00 4 mg via INTRAVENOUS

## 2019-07-19 MED ORDER — CHLORHEXIDINE GLUCONATE CLOTH 2 % EX PADS: 6.0000 | MEDICATED_PAD | Freq: Every day | CUTANEOUS | Status: DC

## 2019-07-19 MED ORDER — PANTOPRAZOLE SODIUM 40 MG PO TBEC
40.0000 mg | DELAYED_RELEASE_TABLET | Freq: Once | ORAL | Status: AC
  Administered 2019-07-19: 40 mg via ORAL
  Filled 2019-07-19: qty 1

## 2019-07-19 MED ORDER — OXYCODONE HCL 5 MG PO TABS
5.0000 mg | ORAL_TABLET | Freq: Once | ORAL | Status: AC
  Administered 2019-07-19: 11:00:00 5 mg via ORAL
  Filled 2019-07-19: qty 1

## 2019-07-19 NOTE — ED Notes (Signed)
Phlebotomy contacted to assist with pt's labs.

## 2019-07-19 NOTE — ED Triage Notes (Addendum)
Pt came in by [facility] with reports of being "cold" & having chest tightness. EMS reports pt is sleepy & nodding off, SR on their monitor, stated that he has dialysis on Tue/Thur/Sat, Left arm restricted. Upon arrival pt was asleep, when woken he c/o no current pain, he states he missed his Sat dialysis recently. VS- BP 163/116, 75 bpm, 15 R, 97.1 Temp.

## 2019-07-19 NOTE — ED Notes (Signed)
Phlebotomy called, no answer.

## 2019-07-19 NOTE — ED Notes (Signed)
IV team has been consulted for an IV & blood draw asistance.

## 2019-07-19 NOTE — ED Notes (Signed)
Pt is a hard stick, this RN had an unsuccessful attempt to start IV/draw labs.
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
## 2019-07-19 NOTE — ED Notes (Signed)
Pt is refusing to be hooked back up to the cardiac monitor after he returned from the restroom, he denies making urine, so no sample was obtained.

## 2019-07-19 NOTE — ED Notes (Signed)
IV team at bedside 

## 2019-07-19 NOTE — Discharge Instructions (Addendum)
Please make sure you keep your dialysis appointments and do not miss them. Thank you for allowing me to care for you today. Please return to the emergency department if you have new or worsening symptoms.

## 2019-07-19 NOTE — ED Provider Notes (Addendum)
  Physical Exam  BP (!) 152/102 (BP Location: Right Arm)   Pulse 77   Temp (!) 97.1 F (36.2 C) (Oral)   Resp 18   SpO2 100%   Physical Exam Vitals and nursing note reviewed.  Constitutional:      Appearance: Normal appearance.  HENT:     Head: Normocephalic.  Eyes:     Conjunctiva/sclera: Conjunctivae normal.  Pulmonary:     Effort: Pulmonary effort is normal.  Skin:    General: Skin is dry.  Neurological:     Mental Status: He is alert.  Psychiatric:        Mood and Affect: Mood normal.     ED Course/Procedures   Clinical Course as of Jul 18 1909  Mon Jul 19, 2019  1806 Spoke with nephrology who will dialyze patient today. They will need 2 hour covid test prior to dialysis. Patient reports "I need my pain medicine and my nausea medicine now if I have to wait two more hours". Patient complains of his chronic chest and belly pain. NAD, sitting up and eating when I enter the room and demanding to be dialyzed as soon as possible.   [KM]  1909 Patient will be discharged home pending an uneventful dialysis   [KM]    Clinical Course User Index [KM] [name], PA-C    Procedures  MDM  Care assumed from [facility] due to change of shift. Homeless patient presenting for the second time today for no specific complaint. He has chronic abdominal pain, n/v and chest pressure which is unchanged from his normal. He missed his last Dialysis treatment on Saturday. Labs show increase K to 6.2 from earlier today, BP 221/210. EKG looks without significant change from this morning. Creatinine elevated.  Will consult nephrology for dialysis and d/c    Frank [name] 07/19/19 1811    Frank [name] 07/19/19 1910    Frank Dessert, MD 07/19/19 2245

## 2019-07-19 NOTE — ED Notes (Signed)
Patient transported to X-ray 

## 2019-07-19 NOTE — Discharge Instructions (Addendum)
1. Medications: usual home medications including zofran and phenergan 2. Treatment: rest, drink plenty of fluids, advance diet slowly 3. Follow Up: Please followup with your primary doctor in 2 days and attend your dialysis on Tuesday; Please return to the ER for persistent vomiting, high fevers or worsening symptoms

## 2019-07-19 NOTE — ED Notes (Signed)
I tried to stick pt and was unsuccessful

## 2019-07-19 NOTE — ED Notes (Signed)
Pt is standing & sitting on the side of the bed at his leisure, this RN noticed that he is sleepy & nodding. With encouragement to lay back & rest the pt refuses to.

## 2019-07-20 NOTE — Progress Notes (Signed)
Late Entry:07/19/2019 20:50 Pt C/O difficulty of breathing, bed and Nasal cannula  Readjusted to accommodate pt. Pt made demands to put feet on floor while connected to HD machine. Pt reeducated on the importance of keeping feet in bed while on HD continued displaying noncompliant behavior.07/19/2019 Dr. Royce Macadamia notified of patient continued behaviors and reeducated patient (from phone) r/t safety concerns while on HD tx. 07/19/2019  21:30 Dr. Royce Macadamia notified again about patient behavior which escalated using profanities at staff and continued to keep his feet on the floor during HD tx. Dr. Royce Macadamia explained to the patient that if the behavior continues he will have to come off HD tx. Pt request IV pain medications for stomach pain and nausea. 07/19/2019 22:00 pt sleep.07/19/2019 23:40 pt became combative while staff holding HD sites jerking arm continuously. Constant redirection from staff to stop jerking arm. Pt stated he is sleep. 07/20/2019 00:38 bleeding stopped sites bandaged.

## 2019-07-20 NOTE — ED Notes (Signed)
Patient returned from Dialysis with fistula bleeding uncontrolled; EDP and Charge notified; Pressurer dressing applied to fistula; Dressing will stay applied for 15-20 minutes and then will reassess; Patient is A&Ox4 on arrival back but is noted Hypertensive-Monique,RN

## 2019-07-20 NOTE — ED Notes (Signed)
ED Provider at bedside. 

## 2019-07-20 NOTE — ED Notes (Signed)
Pt refused to leave - Security escorted pt to lobby via w/c.

## 2019-07-20 NOTE — ED Notes (Signed)
Pt states he is ready to be d/c'd. As d/c paperwork given, pt states he needs a cab voucher to [facility] where his car is located. States he does not know address number. Then states "I can make one up". Per Arbie Cookey, SW, pt may ride bus.

## 2019-07-20 NOTE — ED Notes (Signed)
ED Provider at bedside.

## 2019-07-20 NOTE — ED Provider Notes (Signed)
Patient brought back down to the ED after dialysis because had bleeding from his fistula. Bandage applied with resolution. Bandage removed. No vs abnormalities to suggest acute blood loss anemia. Plan for observation to ensure resolution of bleeding prior to discharge.   Patient observed for multiple hours without bleeding. Stable for discharge.    Coraline Talwar, Corene Cornea, MD 07/20/19 [phone]

## 2019-07-22 ENCOUNTER — Emergency Department (HOSPITAL_BASED_OUTPATIENT_CLINIC_OR_DEPARTMENT_OTHER): Payer: Medicare Other

## 2019-07-22 ENCOUNTER — Emergency Department (HOSPITAL_BASED_OUTPATIENT_CLINIC_OR_DEPARTMENT_OTHER)
Admission: EM | Admit: 2019-07-22 | Discharge: 2019-07-22 | Disposition: A | Discharge status: Home / Self Care | Payer: Medicare Other | Attending: Emergency Medicine | Admitting: Emergency Medicine

## 2019-07-22 ENCOUNTER — Emergency Department (HOSPITAL_COMMUNITY)
Admission: EM | Admit: 2019-07-22 | Discharge: 2019-07-22 | Discharge status: Absent without leave | Payer: Medicare Other

## 2019-07-22 ENCOUNTER — Encounter (HOSPITAL_BASED_OUTPATIENT_CLINIC_OR_DEPARTMENT_OTHER): Payer: Self-pay | Admitting: Emergency Medicine

## 2019-07-22 DIAGNOSIS — T8612 Kidney transplant failure: Secondary | ICD-10-CM | POA: Diagnosis not present

## 2019-07-22 DIAGNOSIS — I132 Hypertensive heart and chronic kidney disease with heart failure and with stage 5 chronic kidney disease, or end stage renal disease: Secondary | ICD-10-CM | POA: Diagnosis not present

## 2019-07-22 DIAGNOSIS — I5042 Chronic combined systolic (congestive) and diastolic (congestive) heart failure: Secondary | ICD-10-CM | POA: Diagnosis not present

## 2019-07-22 DIAGNOSIS — N186 End stage renal disease: Secondary | ICD-10-CM | POA: Diagnosis not present

## 2019-07-22 DIAGNOSIS — Z992 Dependence on renal dialysis: Secondary | ICD-10-CM | POA: Diagnosis not present

## 2019-07-22 DIAGNOSIS — J90 Pleural effusion, not elsewhere classified: Secondary | ICD-10-CM | POA: Diagnosis not present

## 2019-07-22 DIAGNOSIS — Z79899 Other long term (current) drug therapy: Secondary | ICD-10-CM | POA: Insufficient documentation

## 2019-07-22 DIAGNOSIS — G8929 Other chronic pain: Secondary | ICD-10-CM | POA: Insufficient documentation

## 2019-07-22 DIAGNOSIS — R0789 Other chest pain: Secondary | ICD-10-CM | POA: Diagnosis not present

## 2019-07-22 DIAGNOSIS — Z7901 Long term (current) use of anticoagulants: Secondary | ICD-10-CM | POA: Diagnosis not present

## 2019-07-22 DIAGNOSIS — E1122 Type 2 diabetes mellitus with diabetic chronic kidney disease: Secondary | ICD-10-CM | POA: Insufficient documentation

## 2019-07-22 DIAGNOSIS — R1013 Epigastric pain: Secondary | ICD-10-CM | POA: Diagnosis present

## 2019-07-22 DIAGNOSIS — Y829 Unspecified medical devices associated with adverse incidents: Secondary | ICD-10-CM | POA: Diagnosis not present

## 2019-07-22 DIAGNOSIS — K861 Other chronic pancreatitis: Secondary | ICD-10-CM | POA: Insufficient documentation

## 2019-07-22 IMAGING — CR DG CHEST 2V
2 series · 2 of 2 positions shown · non-contrast
Comparison: Chest CT 08/06/2016 and CXR 01/25/2017

CLINICAL DATA: Epigastric pain and dyspnea

EXAM:
CHEST  2 VIEW

[chest pa]
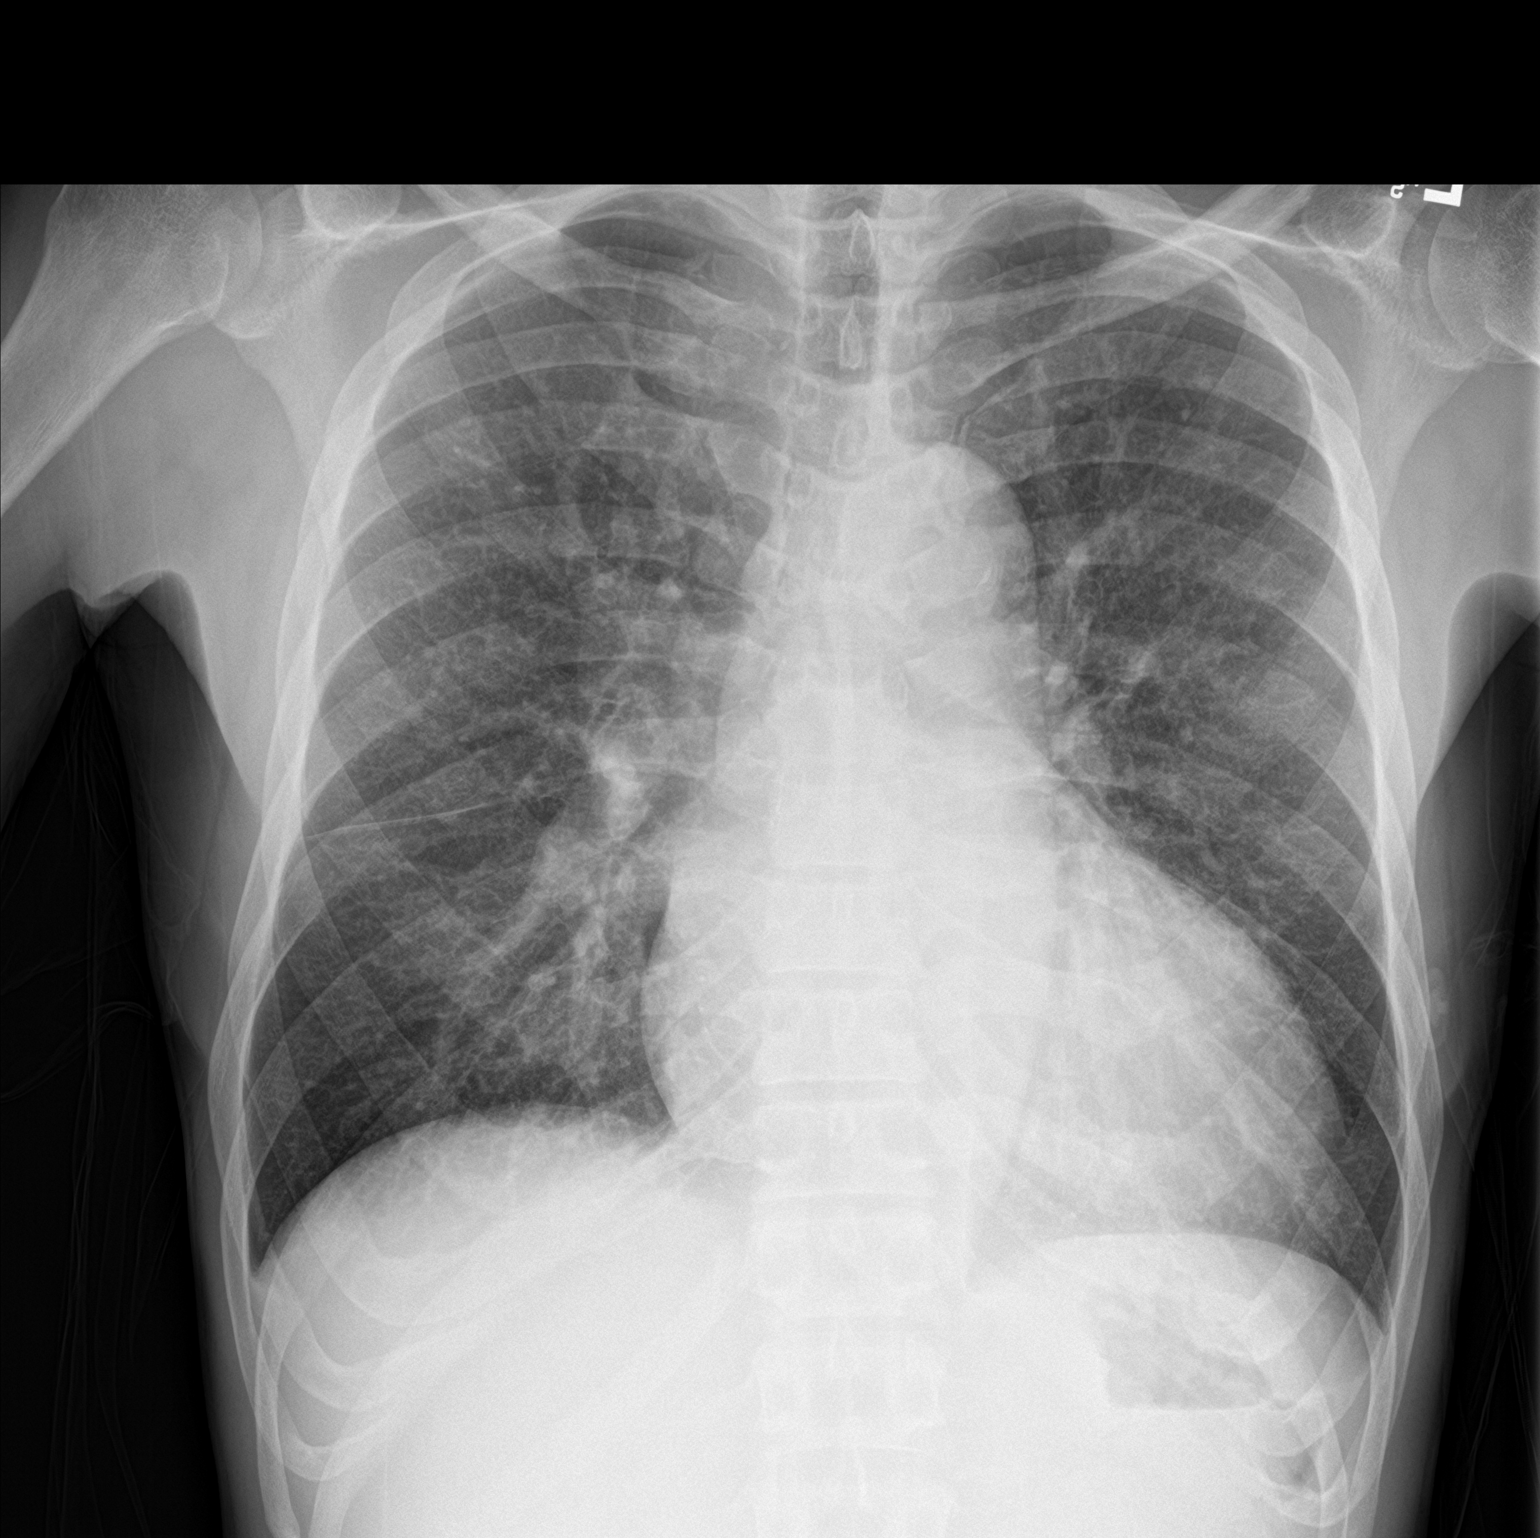

[chest lat]
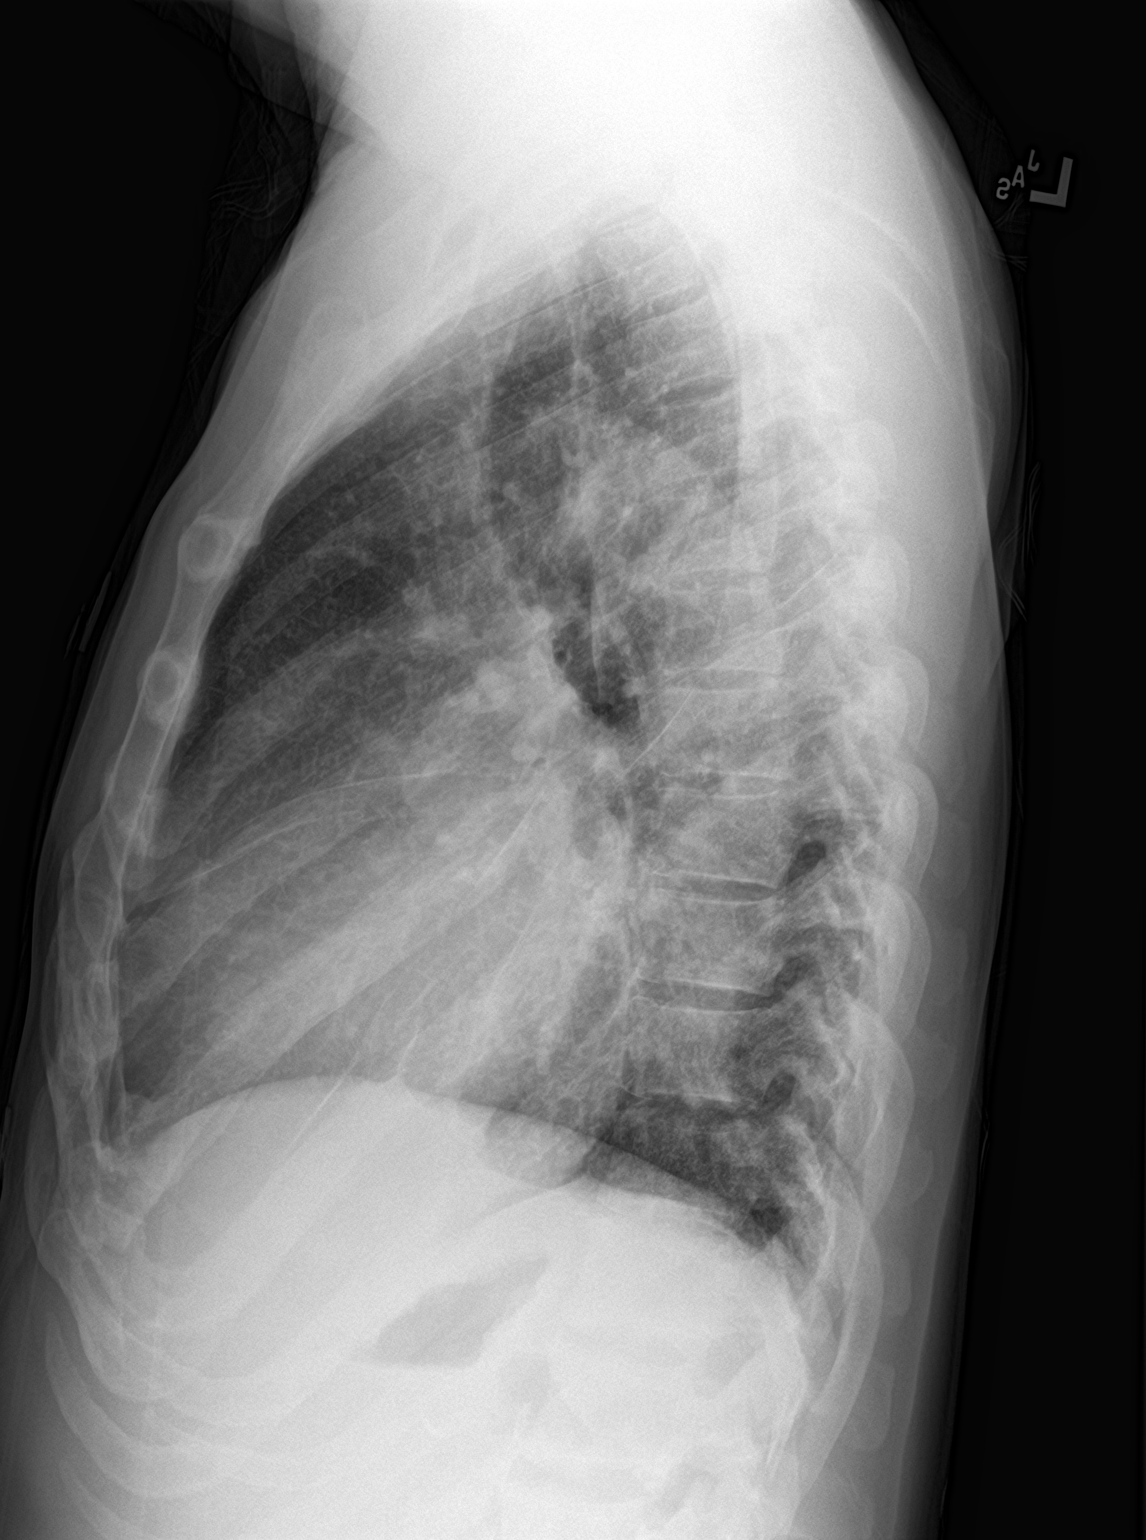

[2 of 2 positions shown; findings below may reference images not displayed]

FINDINGS: Stable cardiomegaly with aortic atherosclerosis. Mild interstitial
opacities favored to represent interstitial edema. Tiny nodular
densities in the left upper lobe are favored to represent branch
points for pulmonary vessels. No pulmonary nodules are seen on
recent CT. No pneumonic consolidation, effusion or pneumothorax. No
acute nor suspicious osseous abnormalities.
IMPRESSION: Stable cardiomegaly with aortic atherosclerosis and probable mild
interstitial edema.

Two tiny nodular densities in left upper lobe likely to reflect
branch points for pulmonary vessels.

## 2019-07-22 MED ORDER — ONDANSETRON 8 MG PO TBDP
8.0000 mg | ORAL_TABLET | Freq: Once | ORAL | Status: AC
  Administered 2019-07-22: 8 mg via ORAL
  Filled 2019-07-22: qty 1

## 2019-07-22 MED ORDER — OXYCODONE HCL 5 MG PO TABS
15.0000 mg | ORAL_TABLET | Freq: Once | ORAL | Status: AC
  Administered 2019-07-22: 15 mg via ORAL
  Filled 2019-07-22: qty 3
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
## 2019-07-22 NOTE — ED Triage Notes (Signed)
Pt states he is having abd pain that started yesterday and has had vomiting  Pt states his family brought him in tonight  Pt states he has dialysis in the morning at 7 and his family is coming back to get him at [facility]

## 2019-07-22 NOTE — ED Notes (Signed)
Patient transported to X-ray 

## 2019-07-22 NOTE — ED Notes (Signed)
ED Provider at bedside. 

## 2019-07-26 ENCOUNTER — Emergency Department (HOSPITAL_BASED_OUTPATIENT_CLINIC_OR_DEPARTMENT_OTHER): Payer: Medicare Other

## 2019-07-26 ENCOUNTER — Other Ambulatory Visit: Payer: Self-pay

## 2019-07-26 ENCOUNTER — Emergency Department (HOSPITAL_BASED_OUTPATIENT_CLINIC_OR_DEPARTMENT_OTHER)
Admission: EM | Admit: 2019-07-26 | Discharge: 2019-07-26 | Disposition: A | Discharge status: Home / Self Care | Payer: Medicare Other | Attending: Emergency Medicine | Admitting: Emergency Medicine

## 2019-07-26 ENCOUNTER — Encounter (HOSPITAL_BASED_OUTPATIENT_CLINIC_OR_DEPARTMENT_OTHER): Payer: Self-pay

## 2019-07-26 ENCOUNTER — Encounter (HOSPITAL_BASED_OUTPATIENT_CLINIC_OR_DEPARTMENT_OTHER): Payer: Self-pay | Admitting: Emergency Medicine

## 2019-07-26 DIAGNOSIS — I5042 Chronic combined systolic (congestive) and diastolic (congestive) heart failure: Secondary | ICD-10-CM | POA: Insufficient documentation

## 2019-07-26 DIAGNOSIS — Z87891 Personal history of nicotine dependence: Secondary | ICD-10-CM | POA: Insufficient documentation

## 2019-07-26 DIAGNOSIS — Z992 Dependence on renal dialysis: Secondary | ICD-10-CM | POA: Insufficient documentation

## 2019-07-26 DIAGNOSIS — I132 Hypertensive heart and chronic kidney disease with heart failure and with stage 5 chronic kidney disease, or end stage renal disease: Secondary | ICD-10-CM | POA: Diagnosis not present

## 2019-07-26 DIAGNOSIS — N186 End stage renal disease: Secondary | ICD-10-CM | POA: Diagnosis not present

## 2019-07-26 DIAGNOSIS — I2699 Other pulmonary embolism without acute cor pulmonale: Secondary | ICD-10-CM | POA: Diagnosis not present

## 2019-07-26 DIAGNOSIS — G8929 Other chronic pain: Secondary | ICD-10-CM

## 2019-07-26 DIAGNOSIS — R079 Chest pain, unspecified: Secondary | ICD-10-CM

## 2019-07-26 DIAGNOSIS — E1122 Type 2 diabetes mellitus with diabetic chronic kidney disease: Secondary | ICD-10-CM | POA: Insufficient documentation

## 2019-07-26 DIAGNOSIS — N183 Chronic kidney disease, stage 3 unspecified: Secondary | ICD-10-CM | POA: Insufficient documentation

## 2019-07-26 DIAGNOSIS — Z7901 Long term (current) use of anticoagulants: Secondary | ICD-10-CM | POA: Diagnosis not present

## 2019-07-26 DIAGNOSIS — R109 Unspecified abdominal pain: Secondary | ICD-10-CM | POA: Insufficient documentation

## 2019-07-26 DIAGNOSIS — R1011 Right upper quadrant pain: Secondary | ICD-10-CM | POA: Diagnosis not present

## 2019-07-26 DIAGNOSIS — N185 Chronic kidney disease, stage 5: Secondary | ICD-10-CM | POA: Insufficient documentation

## 2019-07-26 DIAGNOSIS — R0789 Other chest pain: Secondary | ICD-10-CM | POA: Diagnosis present

## 2019-07-26 DIAGNOSIS — Z79899 Other long term (current) drug therapy: Secondary | ICD-10-CM | POA: Insufficient documentation

## 2019-07-26 LAB — CBC WITH DIFFERENTIAL/PLATELET
Abs Immature Granulocytes: 0.01 10*3/uL (ref 0.00–0.07)
Basophils Absolute: 0 10*3/uL (ref 0.0–0.1)
Basophils Relative: 1 %
Eosinophils Absolute: 0.2 10*3/uL (ref 0.0–0.5)
Eosinophils Relative: 4 %
HCT: 28.8 % — ABNORMAL LOW (ref 39.0–52.0)
Hemoglobin: 8.9 g/dL — ABNORMAL LOW (ref 13.0–17.0)
Immature Granulocytes: 0 %
Lymphocytes Relative: 11 %
Lymphs Abs: 0.5 10*3/uL — ABNORMAL LOW (ref 0.7–4.0)
MCH: 28.1 pg (ref 26.0–34.0)
MCHC: 30.9 g/dL (ref 30.0–36.0)
MCV: 90.9 fL (ref 80.0–100.0)
Monocytes Absolute: 0.5 10*3/uL (ref 0.1–1.0)
Monocytes Relative: 12 %
Neutro Abs: 3 10*3/uL (ref 1.7–7.7)
Neutrophils Relative %: 72 %
Platelets: 147 10*3/uL — ABNORMAL LOW (ref 150–400)
RBC: 3.17 MIL/uL — ABNORMAL LOW (ref 4.22–5.81)
RDW: 17.6 % — ABNORMAL HIGH (ref 11.5–15.5)
WBC: 4.1 10*3/uL (ref 4.0–10.5)
nRBC: 0 % (ref 0.0–0.2)

## 2019-07-26 LAB — COMPREHENSIVE METABOLIC PANEL
ALT: 11 U/L (ref 0–44)
AST: 15 U/L (ref 15–41)
Albumin: 3.6 g/dL (ref 3.5–5.0)
Alkaline Phosphatase: 219 U/L — ABNORMAL HIGH (ref 38–126)
Anion gap: 16 — ABNORMAL HIGH (ref 5–15)
BUN: 44 mg/dL — ABNORMAL HIGH (ref 6–20)
CO2: 27 mmol/L (ref 22–32)
Calcium: 7.6 mg/dL — ABNORMAL LOW (ref 8.9–10.3)
Chloride: 95 mmol/L — ABNORMAL LOW (ref 98–111)
Creatinine, Ser: 10.04 mg/dL — ABNORMAL HIGH (ref 0.61–1.24)
GFR calc Af Amer: 6 mL/min — ABNORMAL LOW (ref >60)
GFR calc non Af Amer: 5 mL/min — ABNORMAL LOW (ref >60)
Glucose, Bld: 77 mg/dL (ref 70–99)
Potassium: 4.8 mmol/L (ref 3.5–5.1)
Sodium: 138 mmol/L (ref 135–145)
Total Bilirubin: 0.6 mg/dL (ref 0.3–1.2)
Total Protein: 8.1 g/dL (ref 6.5–8.1)

## 2019-07-26 LAB — LIPASE, BLOOD: Lipase: 32 U/L (ref 11–51)

## 2019-07-26 MED ORDER — ONDANSETRON HCL 4 MG/2ML IJ SOLN
4.0000 mg | Freq: Once | INTRAMUSCULAR | Status: AC
  Administered 2019-07-26: 4 mg via INTRAVENOUS
  Filled 2019-07-26: qty 2

## 2019-07-26 MED ORDER — PANTOPRAZOLE SODIUM 40 MG IV SOLR
40.0000 mg | Freq: Once | INTRAVENOUS | Status: AC
  Administered 2019-07-26: 40 mg via INTRAVENOUS
  Filled 2019-07-26: qty 40

## 2019-07-26 MED ORDER — SUCRALFATE 1 GM/10ML PO SUSP
1.0000 g | Freq: Once | ORAL | Status: AC
  Administered 2019-07-26: 1 g via ORAL
  Filled 2019-07-26: qty 10

## 2019-07-26 MED ORDER — SUCRALFATE 1 G PO TABS
1.0000 g | ORAL_TABLET | Freq: Once | ORAL | Status: DC
  Filled 2019-07-26: qty 1

## 2019-07-26 NOTE — ED Notes (Signed)
  Patient verbally abusive to staff when obtaining EKG.  Patient sitting on side of the bed and refusing to lay down.  Stated it was hard to breathe laying down and did not want to lay down.  Patient laid down after EKG was done.

## 2019-07-26 NOTE — ED Triage Notes (Signed)
Pt c/o CP x 2 days-NAD-to triage in w/c-pt seen for same today

## 2019-07-26 NOTE — ED Triage Notes (Addendum)
Pt reports abd pain and vomiting for two days. Also complaining of tightness under right arm? Does not elaborate. Hx dialysis. Multiple ED visits for same. Speaks in one word sentences - when asked where he does dialysis so RN can get blood pressure, he states "leg." Pt gets dialyzed in left arm Tues day, Thursday, Saturday; reports good compliance with schedule.
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
## 2019-07-26 NOTE — Discharge Instructions (Addendum)
You were seen today for your abdominal and chest pain.  Your work-up is reassuring.  This pain is consistent with your chronic pain.  Follow-up with your primary doctor for ongoing pain management needs.  Continue dialysis as scheduled.

## 2019-07-27 ENCOUNTER — Emergency Department (HOSPITAL_BASED_OUTPATIENT_CLINIC_OR_DEPARTMENT_OTHER)
Admission: EM | Admit: 2019-07-27 | Discharge: 2019-07-27 | Disposition: A | Discharge status: Home / Self Care | Payer: Medicare Other | Source: Home / Self Care | Attending: Emergency Medicine | Admitting: Emergency Medicine

## 2019-07-27 DIAGNOSIS — G8929 Other chronic pain: Secondary | ICD-10-CM

## 2019-07-27 DIAGNOSIS — R0789 Other chest pain: Secondary | ICD-10-CM

## 2019-07-27 MED ORDER — ALBUTEROL SULFATE HFA 108 (90 BASE) MCG/ACT IN AERS
2.0000 | INHALATION_SPRAY | Freq: Once | RESPIRATORY_TRACT | Status: AC
  Administered 2019-07-27: 2 via RESPIRATORY_TRACT
  Filled 2019-07-27: qty 6.7

## 2019-07-27 MED ORDER — PROCHLORPERAZINE EDISYLATE 10 MG/2ML IJ SOLN
10.0000 mg | Freq: Once | INTRAMUSCULAR | Status: AC
  Administered 2019-07-27: 10 mg via INTRAMUSCULAR
  Filled 2019-07-27: qty 2

## 2019-07-27 NOTE — Discharge Instructions (Addendum)
Please go to your dialysis as scheduled.  Please follow-up with your primary care care physician for your chronic pain and chronic vomiting.  It is inappropriate to continue to come to the ED multiple times a week for chronic issues.  You were seen yesterday for the exact same symptoms and at that time had normal labs.  You do not need repeat labs done today.

## 2019-07-27 NOTE — ED Notes (Signed)
Pt. Went through drawers to find nasal cannula and hooked himself up to oxygen stating "yall aint doing nothing for me". MD asked that door remains open.

## 2019-07-27 NOTE — ED Notes (Signed)
Pt. Refusing vitals recheck as he "can't lay back because its hard to breathe". This tech informed the patient for the need for vitals reassessment. Pt. Agreed if warm blanket could be brought to room. This tech gave pt. Warm blanket and he allowed for vitals.
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
## 2019-08-04 ENCOUNTER — Other Ambulatory Visit: Payer: Self-pay

## 2019-08-04 ENCOUNTER — Emergency Department (HOSPITAL_BASED_OUTPATIENT_CLINIC_OR_DEPARTMENT_OTHER)
Admission: EM | Admit: 2019-08-04 | Discharge: 2019-08-04 | Disposition: A | Discharge status: Home / Self Care | Payer: Medicare Other | Attending: Emergency Medicine | Admitting: Emergency Medicine

## 2019-08-04 DIAGNOSIS — M79662 Pain in left lower leg: Secondary | ICD-10-CM | POA: Diagnosis not present

## 2019-08-04 DIAGNOSIS — I132 Hypertensive heart and chronic kidney disease with heart failure and with stage 5 chronic kidney disease, or end stage renal disease: Secondary | ICD-10-CM | POA: Insufficient documentation

## 2019-08-04 DIAGNOSIS — Z87891 Personal history of nicotine dependence: Secondary | ICD-10-CM | POA: Diagnosis not present

## 2019-08-04 DIAGNOSIS — I5042 Chronic combined systolic (congestive) and diastolic (congestive) heart failure: Secondary | ICD-10-CM | POA: Diagnosis not present

## 2019-08-04 DIAGNOSIS — Z992 Dependence on renal dialysis: Secondary | ICD-10-CM | POA: Insufficient documentation

## 2019-08-04 DIAGNOSIS — E1122 Type 2 diabetes mellitus with diabetic chronic kidney disease: Secondary | ICD-10-CM | POA: Insufficient documentation

## 2019-08-04 DIAGNOSIS — R109 Unspecified abdominal pain: Secondary | ICD-10-CM | POA: Insufficient documentation

## 2019-08-04 DIAGNOSIS — N186 End stage renal disease: Secondary | ICD-10-CM | POA: Diagnosis not present

## 2019-08-04 DIAGNOSIS — M79661 Pain in right lower leg: Secondary | ICD-10-CM | POA: Insufficient documentation

## 2019-08-04 DIAGNOSIS — Z79899 Other long term (current) drug therapy: Secondary | ICD-10-CM | POA: Insufficient documentation

## 2019-08-04 DIAGNOSIS — M79606 Pain in leg, unspecified: Secondary | ICD-10-CM

## 2019-08-04 MED ORDER — SODIUM CHLORIDE 0.9 % IV SOLN
75.00 | INTRAVENOUS | Status: DC
Start: ? — End: 2019-08-04

## 2019-08-04 NOTE — Discharge Instructions (Addendum)
Please follow up with your regularly scheduled dialysis center.
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
## 2019-08-04 NOTE — ED Triage Notes (Signed)
Pt requesting  dialysis , states last [facility] Thursday and has missed 2 txs.

## 2019-08-04 NOTE — ED Notes (Signed)
ED Provider at bedside. 

## 2019-08-09 ENCOUNTER — Emergency Department (HOSPITAL_COMMUNITY): Payer: Medicare Other

## 2019-08-09 ENCOUNTER — Encounter (HOSPITAL_COMMUNITY): Payer: Self-pay | Admitting: *Deleted

## 2019-08-09 ENCOUNTER — Other Ambulatory Visit: Payer: Self-pay

## 2019-08-09 ENCOUNTER — Emergency Department (HOSPITAL_COMMUNITY)
Admission: EM | Admit: 2019-08-09 | Discharge: 2019-08-09 | Disposition: A | Discharge status: Home / Self Care | Payer: Medicare Other | Attending: Emergency Medicine | Admitting: Emergency Medicine

## 2019-08-09 DIAGNOSIS — I132 Hypertensive heart and chronic kidney disease with heart failure and with stage 5 chronic kidney disease, or end stage renal disease: Secondary | ICD-10-CM | POA: Diagnosis not present

## 2019-08-09 DIAGNOSIS — R112 Nausea with vomiting, unspecified: Secondary | ICD-10-CM | POA: Diagnosis not present

## 2019-08-09 DIAGNOSIS — Z7901 Long term (current) use of anticoagulants: Secondary | ICD-10-CM | POA: Insufficient documentation

## 2019-08-09 DIAGNOSIS — E1122 Type 2 diabetes mellitus with diabetic chronic kidney disease: Secondary | ICD-10-CM | POA: Diagnosis not present

## 2019-08-09 DIAGNOSIS — Z992 Dependence on renal dialysis: Secondary | ICD-10-CM | POA: Diagnosis not present

## 2019-08-09 DIAGNOSIS — N186 End stage renal disease: Secondary | ICD-10-CM | POA: Diagnosis not present

## 2019-08-09 DIAGNOSIS — R0602 Shortness of breath: Secondary | ICD-10-CM | POA: Diagnosis not present

## 2019-08-09 DIAGNOSIS — Z87891 Personal history of nicotine dependence: Secondary | ICD-10-CM | POA: Insufficient documentation

## 2019-08-09 DIAGNOSIS — R0789 Other chest pain: Secondary | ICD-10-CM | POA: Diagnosis present

## 2019-08-09 DIAGNOSIS — Z79899 Other long term (current) drug therapy: Secondary | ICD-10-CM | POA: Insufficient documentation

## 2019-08-09 DIAGNOSIS — I5042 Chronic combined systolic (congestive) and diastolic (congestive) heart failure: Secondary | ICD-10-CM | POA: Insufficient documentation

## 2019-08-09 MED ORDER — DICYCLOMINE HCL 10 MG/5ML PO SOLN
10.00 | ORAL | Status: DC
Start: ? — End: 2019-08-09

## 2019-08-09 MED ORDER — DOXYCYCLINE HYCLATE 100 MG PO TABS
100.00 | ORAL_TABLET | ORAL | Status: DC
Start: 2019-08-07 — End: 2019-08-09

## 2019-08-09 MED ORDER — GABAPENTIN 300 MG PO CAPS
300.00 | ORAL_CAPSULE | ORAL | Status: DC
Start: 2019-08-07 — End: 2019-08-09

## 2019-08-09 MED ORDER — FAMOTIDINE 10 MG PO TABS
10.00 | ORAL_TABLET | ORAL | Status: DC
Start: 2019-08-08 — End: 2019-08-09

## 2019-08-09 MED ORDER — SODIUM CHLORIDE 0.9% FLUSH: 3.0000 mL | Freq: Once | INTRAVENOUS | Status: DC

## 2019-08-09 MED ORDER — PROCHLORPERAZINE EDISYLATE 10 MG/2ML IJ SOLN
10.0000 mg | Freq: Once | INTRAMUSCULAR | Status: AC
  Administered 2019-08-09: 10:00:00 10 mg via INTRAMUSCULAR
  Filled 2019-08-09: qty 2

## 2019-08-09 MED ORDER — KETOROLAC TROMETHAMINE 15 MG/ML IJ SOLN
15.0000 mg | Freq: Once | INTRAMUSCULAR | Status: AC
  Administered 2019-08-09: 15 mg via INTRAMUSCULAR
  Filled 2019-08-09: qty 1

## 2019-08-09 MED ORDER — GENERIC EXTERNAL MEDICATION
1.00 | Status: DC
Start: 2019-08-08 — End: 2019-08-09

## 2019-08-09 MED ORDER — HYDRALAZINE HCL 50 MG PO TABS
100.00 | ORAL_TABLET | ORAL | Status: DC
Start: 2019-08-07 — End: 2019-08-09

## 2019-08-09 MED ORDER — NITROGLYCERIN 0.4 MG SL SUBL
0.40 | SUBLINGUAL_TABLET | SUBLINGUAL | Status: DC
Start: ? — End: 2019-08-09

## 2019-08-09 MED ORDER — OXYCODONE HCL 5 MG PO TABS
5.00 | ORAL_TABLET | ORAL | Status: DC
Start: ? — End: 2019-08-09

## 2019-08-09 MED ORDER — APIXABAN 5 MG PO TABS
5.00 | ORAL_TABLET | ORAL | Status: DC
Start: 2019-08-07 — End: 2019-08-09

## 2019-08-09 MED ORDER — SEVELAMER CARBONATE 800 MG PO TABS
2400.00 | ORAL_TABLET | ORAL | Status: DC
Start: 2019-08-07 — End: 2019-08-09

## 2019-08-09 MED ORDER — BUSPIRONE HCL 5 MG PO TABS
5.00 | ORAL_TABLET | ORAL | Status: DC
Start: 2019-08-07 — End: 2019-08-09

## 2019-08-09 MED ORDER — CARVEDILOL 12.5 MG PO TABS
25.00 | ORAL_TABLET | ORAL | Status: DC
Start: 2019-08-07 — End: 2019-08-09

## 2019-08-09 MED ORDER — AMLODIPINE BESYLATE 5 MG PO TABS
5.00 | ORAL_TABLET | ORAL | Status: DC
Start: 2019-08-08 — End: 2019-08-09

## 2019-08-09 NOTE — ED Triage Notes (Signed)
The pt is c/o waking up tonight with vomiting sob and chest pain  No temp .. he reports that hethinks he has pneumonia . Dialysis tuesday

## 2019-08-09 NOTE — ED Triage Notes (Signed)
The pt refused to have an ekg

## 2019-08-09 NOTE — ED Notes (Signed)
Got patient on the blood pressure cuff and pulse oxy patient is resting with call bell in reach

## 2019-08-09 NOTE — ED Notes (Signed)
Pt given cheese, crackers, and ginger ale. Claiborne Billings - RN aware.

## 2019-08-09 NOTE — ED Notes (Signed)
Pt O2 stats are 97% but pt is requesting to be placed on oxygen. Pt is placed on 2lpm O2 by N/C. Pt is also asking for cheese and crackers and apple juice
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
## 2019-08-09 NOTE — ED Notes (Signed)
RN reviewed discharge instructions with pt. Pt had no further questions and was taken to lobby via wheelchair.

## 2019-08-09 NOTE — ED Triage Notes (Signed)
The pt refused to have his blood drawn

## 2019-08-09 NOTE — Discharge Instructions (Addendum)
Please finish the course of antibiotics prescribed from [facility].

## 2019-08-09 NOTE — ED Notes (Signed)
Pt is also refusing to have this tech draw his blood.

## 2019-08-10 ENCOUNTER — Non-Acute Institutional Stay (HOSPITAL_COMMUNITY)
Admission: EM | Admit: 2019-08-10 | Discharge: 2019-08-10 | Disposition: A | Discharge status: Home / Self Care | Payer: Medicare Other | Attending: Nephrology | Admitting: Nephrology

## 2019-08-10 ENCOUNTER — Emergency Department (HOSPITAL_COMMUNITY): Payer: Medicare Other

## 2019-08-10 DIAGNOSIS — Z886 Allergy status to analgesic agent status: Secondary | ICD-10-CM | POA: Insufficient documentation

## 2019-08-10 DIAGNOSIS — I2510 Atherosclerotic heart disease of native coronary artery without angina pectoris: Secondary | ICD-10-CM | POA: Insufficient documentation

## 2019-08-10 DIAGNOSIS — D631 Anemia in chronic kidney disease: Secondary | ICD-10-CM | POA: Diagnosis not present

## 2019-08-10 DIAGNOSIS — R110 Nausea: Secondary | ICD-10-CM | POA: Diagnosis not present

## 2019-08-10 DIAGNOSIS — K7460 Unspecified cirrhosis of liver: Secondary | ICD-10-CM | POA: Insufficient documentation

## 2019-08-10 DIAGNOSIS — I428 Other cardiomyopathies: Secondary | ICD-10-CM | POA: Insufficient documentation

## 2019-08-10 DIAGNOSIS — Z20822 Contact with and (suspected) exposure to covid-19: Secondary | ICD-10-CM | POA: Diagnosis not present

## 2019-08-10 DIAGNOSIS — G4730 Sleep apnea, unspecified: Secondary | ICD-10-CM | POA: Insufficient documentation

## 2019-08-10 DIAGNOSIS — Z940 Kidney transplant status: Secondary | ICD-10-CM | POA: Insufficient documentation

## 2019-08-10 DIAGNOSIS — E875 Hyperkalemia: Secondary | ICD-10-CM | POA: Diagnosis present

## 2019-08-10 DIAGNOSIS — I700 Atherosclerosis of aorta: Secondary | ICD-10-CM | POA: Diagnosis not present

## 2019-08-10 DIAGNOSIS — Z87891 Personal history of nicotine dependence: Secondary | ICD-10-CM | POA: Insufficient documentation

## 2019-08-10 DIAGNOSIS — R531 Weakness: Secondary | ICD-10-CM

## 2019-08-10 DIAGNOSIS — R0602 Shortness of breath: Secondary | ICD-10-CM | POA: Diagnosis not present

## 2019-08-10 DIAGNOSIS — N186 End stage renal disease: Secondary | ICD-10-CM | POA: Insufficient documentation

## 2019-08-10 DIAGNOSIS — Z7901 Long term (current) use of anticoagulants: Secondary | ICD-10-CM | POA: Diagnosis not present

## 2019-08-10 DIAGNOSIS — E46 Unspecified protein-calorie malnutrition: Secondary | ICD-10-CM | POA: Insufficient documentation

## 2019-08-10 DIAGNOSIS — Z8249 Family history of ischemic heart disease and other diseases of the circulatory system: Secondary | ICD-10-CM | POA: Insufficient documentation

## 2019-08-10 DIAGNOSIS — I491 Atrial premature depolarization: Secondary | ICD-10-CM | POA: Insufficient documentation

## 2019-08-10 DIAGNOSIS — K219 Gastro-esophageal reflux disease without esophagitis: Secondary | ICD-10-CM | POA: Insufficient documentation

## 2019-08-10 DIAGNOSIS — Z992 Dependence on renal dialysis: Secondary | ICD-10-CM | POA: Insufficient documentation

## 2019-08-10 DIAGNOSIS — E785 Hyperlipidemia, unspecified: Secondary | ICD-10-CM | POA: Insufficient documentation

## 2019-08-10 DIAGNOSIS — Z79899 Other long term (current) drug therapy: Secondary | ICD-10-CM | POA: Insufficient documentation

## 2019-08-10 DIAGNOSIS — I132 Hypertensive heart and chronic kidney disease with heart failure and with stage 5 chronic kidney disease, or end stage renal disease: Secondary | ICD-10-CM | POA: Insufficient documentation

## 2019-08-10 DIAGNOSIS — J189 Pneumonia, unspecified organism: Secondary | ICD-10-CM | POA: Insufficient documentation

## 2019-08-10 DIAGNOSIS — E1122 Type 2 diabetes mellitus with diabetic chronic kidney disease: Secondary | ICD-10-CM | POA: Diagnosis not present

## 2019-08-10 DIAGNOSIS — Z86711 Personal history of pulmonary embolism: Secondary | ICD-10-CM | POA: Insufficient documentation

## 2019-08-10 DIAGNOSIS — Z86718 Personal history of other venous thrombosis and embolism: Secondary | ICD-10-CM | POA: Insufficient documentation

## 2019-08-10 DIAGNOSIS — R109 Unspecified abdominal pain: Secondary | ICD-10-CM | POA: Insufficient documentation

## 2019-08-10 DIAGNOSIS — I272 Pulmonary hypertension, unspecified: Secondary | ICD-10-CM | POA: Insufficient documentation

## 2019-08-10 DIAGNOSIS — G8929 Other chronic pain: Secondary | ICD-10-CM | POA: Insufficient documentation

## 2019-08-10 DIAGNOSIS — I5042 Chronic combined systolic (congestive) and diastolic (congestive) heart failure: Secondary | ICD-10-CM | POA: Insufficient documentation

## 2019-08-10 LAB — RESPIRATORY PANEL BY RT PCR (FLU A&B, COVID)
Influenza A by PCR: NEGATIVE
Influenza B by PCR: NEGATIVE
SARS Coronavirus 2 by RT PCR: NEGATIVE

## 2019-08-10 LAB — I-STAT CHEM 8, ED
BUN: 101 mg/dL — ABNORMAL HIGH (ref 6–20)
Calcium, Ion: 0.86 mmol/L — CL (ref 1.15–1.40)
Chloride: 107 mmol/L (ref 98–111)
Creatinine, Ser: 15 mg/dL — ABNORMAL HIGH (ref 0.61–1.24)
Glucose, Bld: 69 mg/dL — ABNORMAL LOW (ref 70–99)
HCT: 33 % — ABNORMAL LOW (ref 39.0–52.0)
Hemoglobin: 11.2 g/dL — ABNORMAL LOW (ref 13.0–17.0)
Potassium: 6.6 mmol/L (ref 3.5–5.1)
Sodium: 137 mmol/L (ref 135–145)
TCO2: 21 mmol/L — ABNORMAL LOW (ref 22–32)

## 2019-08-10 LAB — CBC
HCT: 34.2 % — ABNORMAL LOW (ref 39.0–52.0)
Hemoglobin: 10.4 g/dL — ABNORMAL LOW (ref 13.0–17.0)
MCH: 28.3 pg (ref 26.0–34.0)
MCHC: 30.4 g/dL (ref 30.0–36.0)
MCV: 93.2 fL (ref 80.0–100.0)
Platelets: 157 10*3/uL (ref 150–400)
RBC: 3.67 MIL/uL — ABNORMAL LOW (ref 4.22–5.81)
RDW: 17.6 % — ABNORMAL HIGH (ref 11.5–15.5)
WBC: 5.8 10*3/uL (ref 4.0–10.5)
nRBC: 0 % (ref 0.0–0.2)

## 2019-08-10 LAB — BASIC METABOLIC PANEL
Anion gap: 19 — ABNORMAL HIGH (ref 5–15)
BUN: 95 mg/dL — ABNORMAL HIGH (ref 6–20)
CO2: 20 mmol/L — ABNORMAL LOW (ref 22–32)
Calcium: 7.7 mg/dL — ABNORMAL LOW (ref 8.9–10.3)
Chloride: 102 mmol/L (ref 98–111)
Creatinine, Ser: 14.05 mg/dL — ABNORMAL HIGH (ref 0.61–1.24)
GFR calc Af Amer: 4 mL/min — ABNORMAL LOW (ref >60)
GFR calc non Af Amer: 3 mL/min — ABNORMAL LOW (ref >60)
Glucose, Bld: 72 mg/dL (ref 70–99)
Potassium: 6.9 mmol/L (ref 3.5–5.1)
Sodium: 141 mmol/L (ref 135–145)

## 2019-08-10 LAB — HEPATITIS B SURFACE ANTIGEN: Hepatitis B Surface Ag: NONREACTIVE

## 2019-08-10 MED ORDER — CHLORHEXIDINE GLUCONATE CLOTH 2 % EX PADS: 6.0000 | MEDICATED_PAD | Freq: Every day | CUTANEOUS | Status: DC

## 2019-08-10 MED ORDER — SODIUM CHLORIDE 0.9 % IV SOLN: 100.0000 mL | INTRAVENOUS | Status: DC | PRN

## 2019-08-10 MED ORDER — LIDOCAINE-PRILOCAINE 2.5-2.5 % EX CREA
1.0000 "application " | TOPICAL_CREAM | CUTANEOUS | Status: DC | PRN
  Filled 2019-08-10: qty 5

## 2019-08-10 MED ORDER — HYDRALAZINE HCL 20 MG/ML IJ SOLN
20.0000 mg | Freq: Once | INTRAMUSCULAR | Status: AC
  Administered 2019-08-10: 20 mg via INTRAVENOUS
  Filled 2019-08-10: qty 1

## 2019-08-10 MED ORDER — LIDOCAINE HCL (PF) 1 % IJ SOLN: 5.0000 mL | INTRAMUSCULAR | Status: DC | PRN

## 2019-08-10 MED ORDER — PENTAFLUOROPROP-TETRAFLUOROETH EX AERO
1.0000 "application " | INHALATION_SPRAY | CUTANEOUS | Status: DC | PRN
  Filled 2019-08-10: qty 30

## 2019-08-10 MED ORDER — HYDROMORPHONE HCL 1 MG/ML IJ SOLN
1.0000 mg | Freq: Once | INTRAMUSCULAR | Status: AC
  Administered 2019-08-10: 1 mg via INTRAVENOUS
  Filled 2019-08-10: qty 1

## 2019-08-10 MED ORDER — ALTEPLASE 2 MG IJ SOLR: 2.0000 mg | Freq: Once | INTRAMUSCULAR | Status: DC | PRN

## 2019-08-10 MED ORDER — AMLODIPINE BESYLATE 5 MG PO TABS
10.0000 mg | ORAL_TABLET | Freq: Once | ORAL | Status: AC
  Administered 2019-08-10: 10 mg via ORAL
  Filled 2019-08-10: qty 2

## 2019-08-10 MED ORDER — HEPARIN SODIUM (PORCINE) 1000 UNIT/ML DIALYSIS
1000.0000 [IU] | INTRAMUSCULAR | Status: DC | PRN
  Filled 2019-08-10: qty 1

## 2019-08-10 MED ORDER — SODIUM THIOSULFATE 25 % IV SOLN
25.0000 g | INTRAVENOUS | Status: AC
  Administered 2019-08-10: 25 g via INTRAVENOUS
  Filled 2019-08-10: qty 100

## 2019-08-10 MED ORDER — CLONIDINE HCL 0.1 MG PO TABS
0.1000 mg | ORAL_TABLET | Freq: Once | ORAL | Status: AC
  Administered 2019-08-10: 0.1 mg via ORAL
  Filled 2019-08-10: qty 1

## 2019-08-10 NOTE — ED Notes (Signed)
Redrew when [email or web address] back from xray the bmp
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
## 2019-08-10 NOTE — ED Notes (Addendum)
Pt arrived from dialysis via stretcher. Pt noted to be lying on stretcher w/eyes closed. Respirations even, unlabored. Per dialysis, pt was given 2 tuna sandwiches and apples in addition to crackers and drinks. Also reported pt received Clonidine 0.32m for elevated BP.

## 2019-08-10 NOTE — Discharge Instructions (Addendum)
You were evaluated in the Emergency Department and after careful evaluation, we did not find any emergent condition requiring admission or further testing in the hospital.  Your exam/testing today was overall reassuring.  Your potassium level was dangerously high and so we were able to perform dialysis here in the hospital.  Please continue your dialysis schedule as an outpatient.  Please return to the Emergency Department if you experience any worsening of your condition.  We encourage you to follow up with a primary care provider.  Thank you for allowing [place] to be a part of your care.

## 2019-08-10 NOTE — ED Notes (Signed)
RN, EMT, and Phlebotomist attempted to obtain blood sample for I-stat, unsuccessful.

## 2019-08-10 NOTE — Progress Notes (Addendum)
Renal Navigator spoke with Dr. Lurlean Horns. Given patient's behavior and that he is adamant that he will not go to OP HD clinic for treatment today, he is not safe to discharge without HD. Dr. Augustin Coupe will order inpatient HD today. Renal Navigator messaged EDP/Dr. Johnney Killian and ED RN/Louie. Renal Navigator notified Renal PA and OP HD clinic that patient will not be coming to appointment today.  Alphonzo Cruise, [place] Renal Navigator [phone]

## 2019-08-10 NOTE — ED Notes (Signed)
Patient given sandwich bag at discharge as well as provided a cab voucher via Nurse Case manager-Monique,RN

## 2019-08-10 NOTE — ED Notes (Signed)
Pt refuses BP in arm.

## 2019-08-10 NOTE — Progress Notes (Signed)
Brief consult note:  Patient is a 54 yr old ESRD patient who presented to the ED due to weakness.  Reports lower extremities feeling weak and difficult to ambulate.  Per patient last HD on 1/15 but outpatient record indicates last at OP center on 07/31/19.  Historically non compliant.  Labs completed in ED with K 6.9, then 6.6.  Had appointment set up at outpatient dialysis but reports he is "too weak to go."  Plan for HD here with low K bath and d/c home if symptoms improved.   OP orders: [facility] - TTS 4hrs 2K 2.25Ca WTU88.2CM Sodium thiosulfate 25g IV qHD No Heparin mircera 244mg q2wks - last 12/31 parsabiv 7.544mqHD Venofer 10043mHD x10  LinJen MowA-C [facility] Pager: [phone]

## 2019-08-10 NOTE — Progress Notes (Signed)
Renal Navigator notified by Renal PA that patient is in ED and can be discharged if OP HD clinic can provide his HD treatment today. Renal Navigator has secured OP HD appointment for him today at 12:20pm and spoke with ED bedside RN, who was in room with patient. Patient adamantly refuses to go to OP HD clinic for HD today, stating that he is "not well enough" to go. Renal Navigator notified Renal PA and left message for Dr. Lin/Nephrologist to advise and is awaiting return call at this time.   Alphonzo Cruise, [place] Renal Navigator [phone]

## 2019-08-10 NOTE — ED Triage Notes (Signed)
Pt c/o SOB, states he has "fluid overload". Due for dialysis tomorrow. Has been completing full treatment.

## 2019-08-10 NOTE — ED Provider Notes (Signed)
IV team getting basic chemistry and IV start.  Patient has been extremely difficult IV start.  Plan is to evaluate the potassium and determine urgency of dialysis, with possible outpatient dialysis this afternoon.  Patient reports he has felt very weak and generally ill.  Reports this is a lot worse after dialysis sessions.  He reports he has been having problems with right sided chest discomfort.  He reports he has not taken any of his medications for several days. Physical Exam  BP (!) 251/158   Pulse 69   Temp 97.8 F (36.6 C) (Oral)   Resp 16   SpO2 98%   Physical Exam Constitutional:      Comments: Patient is alert sitting up in the stretcher.  He reports he feels more comfortable sitting up because of he lies down he has more pain in the right side of his chest.  Patient appears uncomfortable no significant respiratory distress at rest.  Cardiovascular:     Rate and Rhythm: Normal rate and regular rhythm.  Pulmonary:     Comments: No significant increased work of breathing.  Right base does have crackles and softer breath sounds.  Airflow on the left is good without significant wheeze or crackle. Neurological:     General: No focal deficit present.     Mental Status: He is oriented to person, place, and time.     Coordination: Coordination normal.     ED Course/Procedures     Procedures CRITICAL CARE Performed by: Charlesetta Shanks   Total critical care time: 30 minutes  Critical care time was exclusive of separately billable procedures and treating other patients.  Critical care was necessary to treat or prevent imminent or life-threatening deterioration.  Critical care was time spent personally by me on the following activities: development of treatment plan with patient and/or surrogate as well as nursing, discussions with consultants, evaluation of patient's response to treatment, examination of patient, obtaining history from patient or surrogate, ordering and  performing treatments and interventions, ordering and review of laboratory studies, ordering and review of radiographic studies, pulse oximetry and re-evaluation of patient's condition. MDM  Consult: Have reviewed with nephrology.    Patient's blood pressure documentation has taken a significant elevation abruptly.  Will need to confirm this with manual pressures.  Due to patient's no established IV and fistula, blood pressure cuff is on the patient's lower leg.  Unclear if these pressures are accurate, will have nursing obtain manual pressure.  At baseline patient would take hydralazine and Norvasc which she has not been taking, will administer oral Norvasc and a dose of IV hydralazine.  Patient reports he has generalized pain and wants pain medication now that his IV is established.  Nurse update: Patient refuses to allow any pressures obtained from his arms.  He will only allow the cuff placed on his lower leg.  Without the patient's cooperation it may be impossible to get accurate and confirm pressures.  Patient would certainly be due for hydralazine and Norvasc, will proceed with administration of these medications.  Patient reported that he did not feel well enough to be transported to outpatient dialysis.  He refused on the grounds that he is too sick to go.  Nephrology came to the emergency department to do consultation.  Plan will now be to dialyze in the hospital with anticipated discharge after dialysis.  Need to obtain 2-hour Covid testing for patient to be able to go to dialysis.  Repeat blood pressure is now consistent with  previous numbers.  We will continue to monitor as best as possible.       Charlesetta Shanks, MD 08/10/19 1139

## 2019-08-10 NOTE — ED Provider Notes (Signed)
  Provider Note MRN:  [id number]  Arrival date & time: 08/10/19    ED Course and Medical Decision Making  Assumed care from default provider at shift change.  Per chart review patient was hyperkalemic and so dialysis was performed for patient.  Plan was to discharge after dialysis.  I have personally reevaluated the patient, he still feels weak but currently there is no indication for admission or further testing here in the emergency department.  He is appropriate for discharge.  Procedures  Final Clinical Impressions(s) / ED Diagnoses     ICD-10-CM   1. Hyperkalemia  E87.5   2. Weakness  R53.1     ED Discharge Orders    None        Discharge Instructions     You were evaluated in the Emergency Department and after careful evaluation, we did not find any emergent condition requiring admission or further testing in the hospital.  Your exam/testing today was overall reassuring.  Your potassium level was dangerously high and so we were able to perform dialysis here in the hospital.  Please continue your dialysis schedule as an outpatient.  Please return to the Emergency Department if you experience any worsening of your condition.  We encourage you to follow up with a primary care provider.  Thank you for allowing [place] to be a part of your care.     Barth Kirks. [place] Small, [place] [email or web address] .Felton Clinton, MD 08/10/19 [phone]

## 2019-08-16 ENCOUNTER — Emergency Department (HOSPITAL_COMMUNITY)
Admission: EM | Admit: 2019-08-16 | Discharge: 2019-08-17 | Disposition: A | Discharge status: Home / Self Care | Payer: Medicare Other | Attending: Emergency Medicine | Admitting: Emergency Medicine

## 2019-08-16 ENCOUNTER — Encounter (HOSPITAL_COMMUNITY): Payer: Self-pay | Admitting: Emergency Medicine

## 2019-08-16 ENCOUNTER — Emergency Department (HOSPITAL_COMMUNITY): Payer: Medicare Other

## 2019-08-16 ENCOUNTER — Other Ambulatory Visit: Payer: Self-pay

## 2019-08-16 DIAGNOSIS — I132 Hypertensive heart and chronic kidney disease with heart failure and with stage 5 chronic kidney disease, or end stage renal disease: Secondary | ICD-10-CM | POA: Diagnosis not present

## 2019-08-16 DIAGNOSIS — E1122 Type 2 diabetes mellitus with diabetic chronic kidney disease: Secondary | ICD-10-CM | POA: Insufficient documentation

## 2019-08-16 DIAGNOSIS — I5042 Chronic combined systolic (congestive) and diastolic (congestive) heart failure: Secondary | ICD-10-CM | POA: Insufficient documentation

## 2019-08-16 DIAGNOSIS — N186 End stage renal disease: Secondary | ICD-10-CM | POA: Diagnosis not present

## 2019-08-16 DIAGNOSIS — Z79899 Other long term (current) drug therapy: Secondary | ICD-10-CM | POA: Diagnosis not present

## 2019-08-16 DIAGNOSIS — R0602 Shortness of breath: Secondary | ICD-10-CM | POA: Insufficient documentation

## 2019-08-16 DIAGNOSIS — Z87891 Personal history of nicotine dependence: Secondary | ICD-10-CM | POA: Insufficient documentation

## 2019-08-16 DIAGNOSIS — R109 Unspecified abdominal pain: Secondary | ICD-10-CM | POA: Insufficient documentation

## 2019-08-16 DIAGNOSIS — R1110 Vomiting, unspecified: Secondary | ICD-10-CM | POA: Diagnosis not present

## 2019-08-16 DIAGNOSIS — Z992 Dependence on renal dialysis: Secondary | ICD-10-CM | POA: Diagnosis not present

## 2019-08-16 DIAGNOSIS — Z9885 Transplanted organ removal status: Secondary | ICD-10-CM | POA: Diagnosis not present

## 2019-08-16 MED ORDER — OXYCODONE HCL 5 MG PO TABS
10.0000 mg | ORAL_TABLET | Freq: Once | ORAL | Status: AC
  Administered 2019-08-16: 10 mg via ORAL
  Filled 2019-08-16: qty 2

## 2019-08-16 MED ORDER — PROCHLORPERAZINE EDISYLATE 10 MG/2ML IJ SOLN
10.0000 mg | Freq: Once | INTRAMUSCULAR | Status: AC
  Administered 2019-08-16: 10 mg via INTRAMUSCULAR
  Filled 2019-08-16: qty 2

## 2019-08-16 MED ORDER — SODIUM CHLORIDE 0.9% FLUSH: 3.0000 mL | Freq: Once | INTRAVENOUS | Status: DC

## 2019-08-16 NOTE — ED Triage Notes (Signed)
Pt c/o abdominal pain/nausea/vomiting and shortness of breath. States his last dialysis was Thursday.
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
## 2019-08-17 LAB — COMPREHENSIVE METABOLIC PANEL
ALT: 27 U/L (ref 0–44)
AST: 31 U/L (ref 15–41)
Albumin: 3.3 g/dL — ABNORMAL LOW (ref 3.5–5.0)
Alkaline Phosphatase: 231 U/L — ABNORMAL HIGH (ref 38–126)
Anion gap: 18 — ABNORMAL HIGH (ref 5–15)
BUN: 65 mg/dL — ABNORMAL HIGH (ref 6–20)
CO2: 22 mmol/L (ref 22–32)
Calcium: 7.3 mg/dL — ABNORMAL LOW (ref 8.9–10.3)
Chloride: 99 mmol/L (ref 98–111)
Creatinine, Ser: 11.35 mg/dL — ABNORMAL HIGH (ref 0.61–1.24)
GFR calc Af Amer: 5 mL/min — ABNORMAL LOW (ref >60)
GFR calc non Af Amer: 4 mL/min — ABNORMAL LOW (ref >60)
Glucose, Bld: 84 mg/dL (ref 70–99)
Potassium: 5.1 mmol/L (ref 3.5–5.1)
Sodium: 139 mmol/L (ref 135–145)
Total Bilirubin: 0.8 mg/dL (ref 0.3–1.2)
Total Protein: 7.7 g/dL (ref 6.5–8.1)

## 2019-08-17 LAB — CBC
HCT: 30.2 % — ABNORMAL LOW (ref 39.0–52.0)
Hemoglobin: 9.3 g/dL — ABNORMAL LOW (ref 13.0–17.0)
MCH: 27.7 pg (ref 26.0–34.0)
MCHC: 30.8 g/dL (ref 30.0–36.0)
MCV: 89.9 fL (ref 80.0–100.0)
Platelets: 169 10*3/uL (ref 150–400)
RBC: 3.36 MIL/uL — ABNORMAL LOW (ref 4.22–5.81)
RDW: 17.7 % — ABNORMAL HIGH (ref 11.5–15.5)
WBC: 4.8 10*3/uL (ref 4.0–10.5)
nRBC: 0 % (ref 0.0–0.2)

## 2019-08-17 LAB — LIPASE, BLOOD: Lipase: 34 U/L (ref 11–51)

## 2019-08-17 NOTE — ED Notes (Signed)
Patient refused vital signs

## 2019-08-18 MED FILL — ONDANSETRON HCL 4 MG TABLET: 4 | 14 days supply | Qty: 42 | Fill #0

## 2019-08-18 MED FILL — CYCLOBENZAPRINE 10 MG TAB: 10 | 30 days supply | Qty: 90 | Fill #0

## 2019-08-19 IMAGING — CR DG CHEST 2V
2 series · 2 of 2 positions shown · non-contrast
Comparison: Chest x-ray 03/02/2017.

CLINICAL DATA: 51-year-old male with history of malfunctioning
dialysis fistula. Missed dialysis yesterday.

EXAM:
CHEST  2 VIEW

[chest lat]
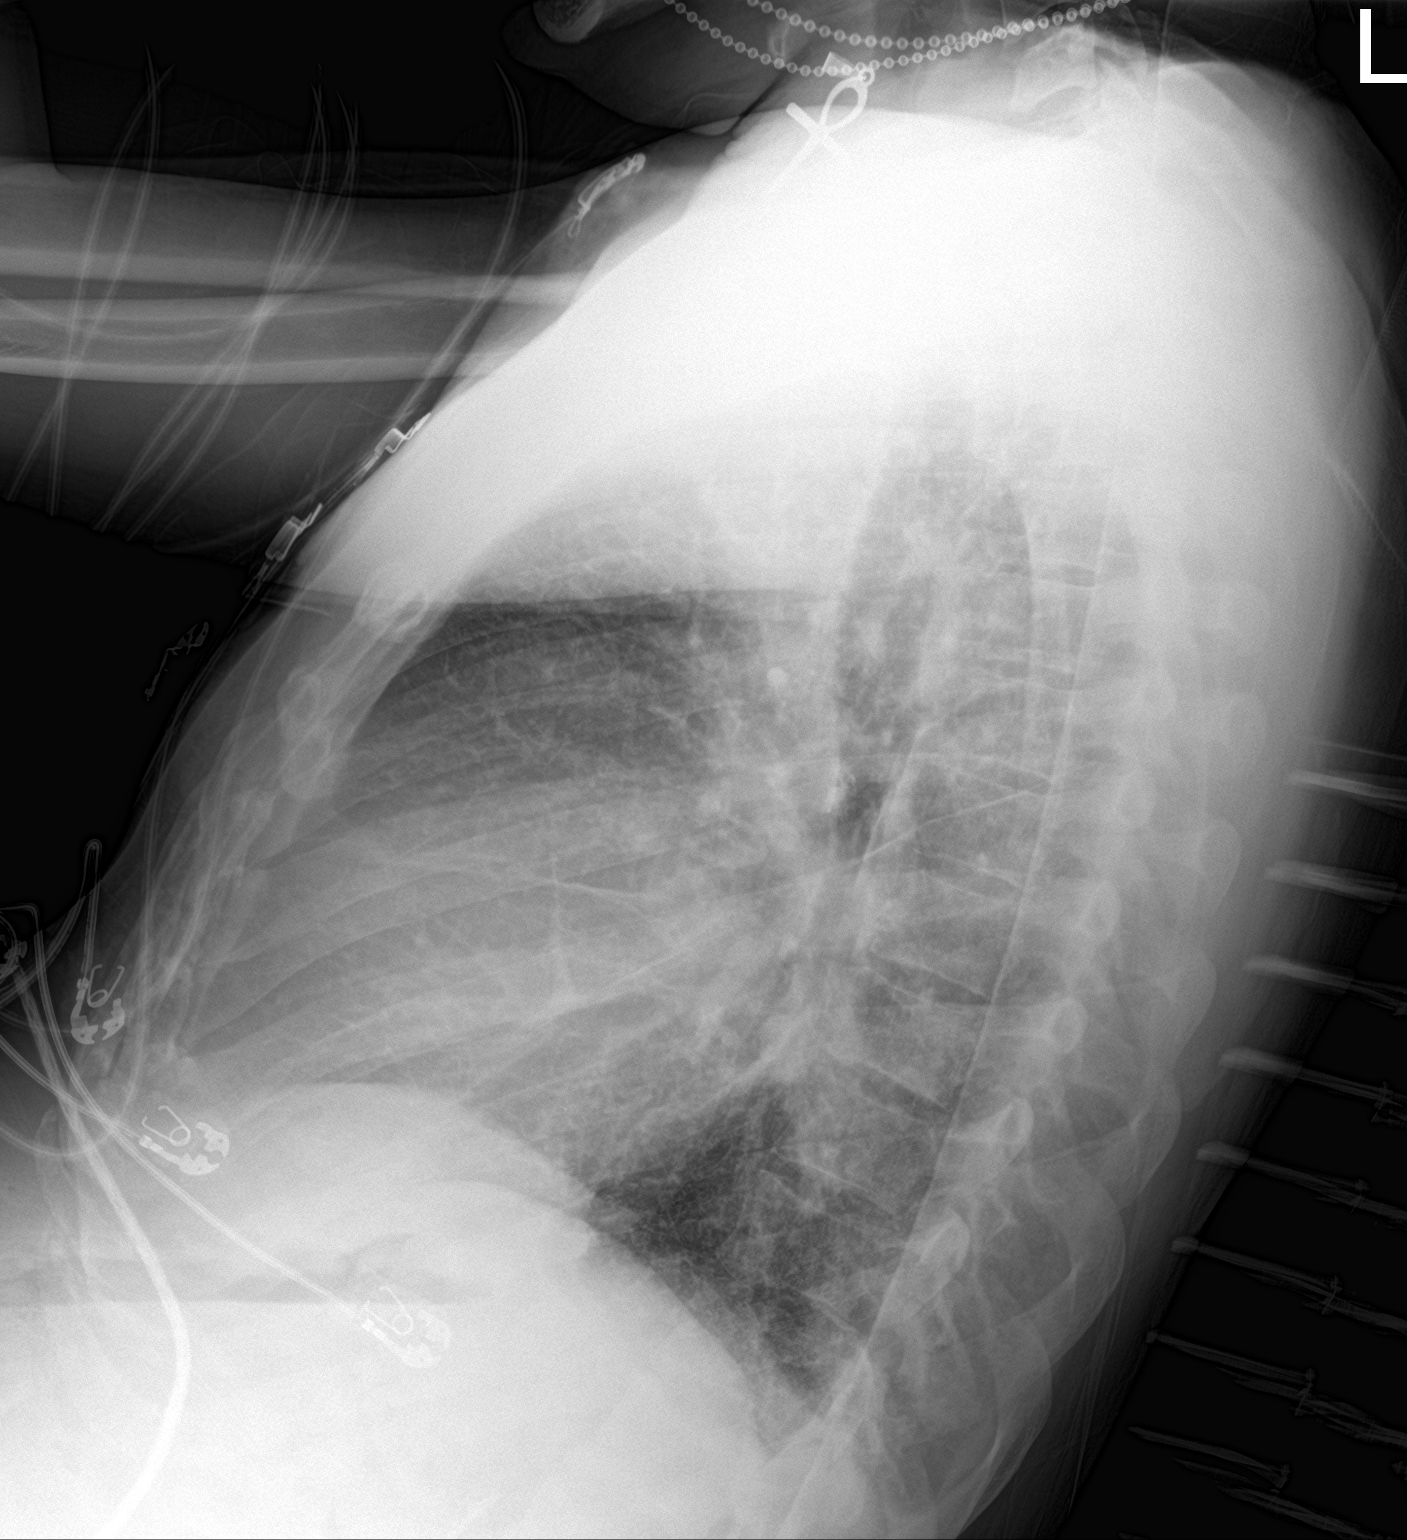

[chest ap]
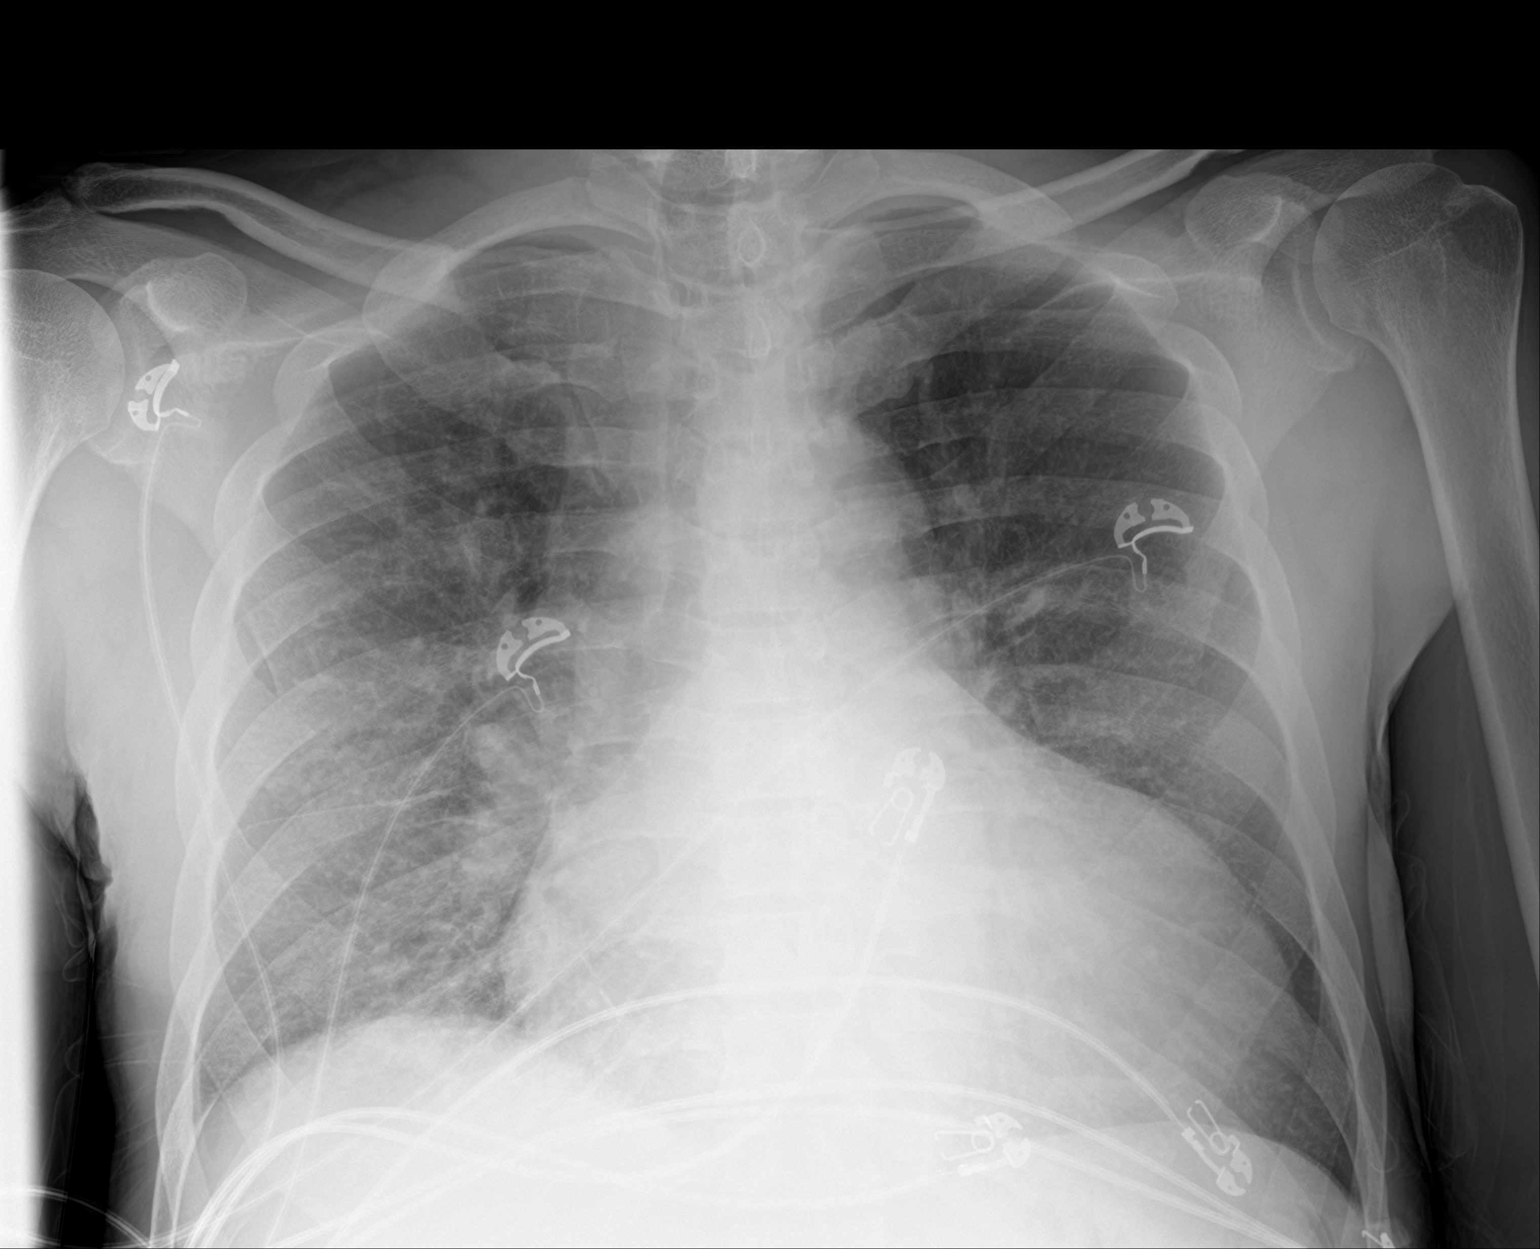

[2 of 2 positions shown; findings below may reference images not displayed]

FINDINGS: There is cephalization of the pulmonary vasculature and slight
indistinctness of the interstitial markings suggestive of mild
pulmonary edema. No definite pleural effusions. Mild cardiomegaly.
Upper mediastinal contours are slightly distorted by patient's
rotation the right.
IMPRESSION: 1. Findings suggests mild congestive heart failure, as above.

## 2019-08-20 MED FILL — oxyCODONE HCL 15 MG TABS: 15 | 30 days supply | Qty: 180 | Fill #0

## 2019-09-01 ENCOUNTER — Other Ambulatory Visit: Payer: Self-pay

## 2019-09-01 ENCOUNTER — Emergency Department (HOSPITAL_COMMUNITY)
Admission: EM | Admit: 2019-09-01 | Discharge: 2019-09-02 | Disposition: A | Discharge status: Home / Self Care | Payer: Medicare Other | Attending: Emergency Medicine | Admitting: Emergency Medicine

## 2019-09-01 ENCOUNTER — Emergency Department (HOSPITAL_COMMUNITY): Payer: Medicare Other

## 2019-09-01 ENCOUNTER — Encounter (HOSPITAL_COMMUNITY): Payer: Self-pay | Admitting: Emergency Medicine

## 2019-09-01 DIAGNOSIS — Z87891 Personal history of nicotine dependence: Secondary | ICD-10-CM | POA: Insufficient documentation

## 2019-09-01 DIAGNOSIS — Z86711 Personal history of pulmonary embolism: Secondary | ICD-10-CM | POA: Insufficient documentation

## 2019-09-01 DIAGNOSIS — N185 Chronic kidney disease, stage 5: Secondary | ICD-10-CM | POA: Insufficient documentation

## 2019-09-01 DIAGNOSIS — R0602 Shortness of breath: Secondary | ICD-10-CM | POA: Insufficient documentation

## 2019-09-01 DIAGNOSIS — R112 Nausea with vomiting, unspecified: Secondary | ICD-10-CM | POA: Insufficient documentation

## 2019-09-01 DIAGNOSIS — R072 Precordial pain: Secondary | ICD-10-CM | POA: Insufficient documentation

## 2019-09-01 DIAGNOSIS — Z86718 Personal history of other venous thrombosis and embolism: Secondary | ICD-10-CM | POA: Diagnosis not present

## 2019-09-01 DIAGNOSIS — Z79899 Other long term (current) drug therapy: Secondary | ICD-10-CM | POA: Diagnosis not present

## 2019-09-01 DIAGNOSIS — I5021 Acute systolic (congestive) heart failure: Secondary | ICD-10-CM | POA: Insufficient documentation

## 2019-09-01 DIAGNOSIS — I132 Hypertensive heart and chronic kidney disease with heart failure and with stage 5 chronic kidney disease, or end stage renal disease: Secondary | ICD-10-CM | POA: Diagnosis not present

## 2019-09-01 MED ORDER — PROMETHAZINE HCL 25 MG/ML IJ SOLN
25.0000 mg | Freq: Once | INTRAMUSCULAR | Status: AC
  Administered 2019-09-02: 25 mg via INTRAMUSCULAR
  Filled 2019-09-01: qty 1

## 2019-09-01 MED ORDER — OXYCODONE HCL 5 MG PO TABS
10.0000 mg | ORAL_TABLET | Freq: Once | ORAL | Status: AC
  Administered 2019-09-02: 10 mg via ORAL
  Filled 2019-09-01: qty 2

## 2019-09-01 MED ORDER — SODIUM CHLORIDE 0.9% FLUSH: 3.0000 mL | Freq: Once | INTRAVENOUS | Status: DC
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
## 2019-09-01 NOTE — ED Notes (Signed)
Refused blood draw from phlebotomist.

## 2019-09-01 NOTE — ED Triage Notes (Signed)
Patient reports right upper chest pain radiating to right axilla with SOB onset this evening , no emesis or diaphoresis , denies cough or fever , patient added upper abdominal pain , hemodialysis treatment last Saturday .

## 2019-09-02 ENCOUNTER — Emergency Department (HOSPITAL_COMMUNITY)
Admission: EM | Admit: 2019-09-02 | Discharge: 2019-09-02 | Disposition: A | Discharge status: Home / Self Care | Payer: Medicare Other | Source: Home / Self Care | Attending: Emergency Medicine | Admitting: Emergency Medicine

## 2019-09-02 ENCOUNTER — Encounter (HOSPITAL_COMMUNITY): Payer: Self-pay | Admitting: Emergency Medicine

## 2019-09-02 DIAGNOSIS — N186 End stage renal disease: Secondary | ICD-10-CM

## 2019-09-02 DIAGNOSIS — Z87891 Personal history of nicotine dependence: Secondary | ICD-10-CM | POA: Insufficient documentation

## 2019-09-02 DIAGNOSIS — E1122 Type 2 diabetes mellitus with diabetic chronic kidney disease: Secondary | ICD-10-CM | POA: Insufficient documentation

## 2019-09-02 DIAGNOSIS — I509 Heart failure, unspecified: Secondary | ICD-10-CM | POA: Insufficient documentation

## 2019-09-02 DIAGNOSIS — I132 Hypertensive heart and chronic kidney disease with heart failure and with stage 5 chronic kidney disease, or end stage renal disease: Secondary | ICD-10-CM | POA: Insufficient documentation

## 2019-09-02 DIAGNOSIS — Z992 Dependence on renal dialysis: Secondary | ICD-10-CM | POA: Insufficient documentation

## 2019-09-02 DIAGNOSIS — Z79899 Other long term (current) drug therapy: Secondary | ICD-10-CM | POA: Insufficient documentation

## 2019-09-02 DIAGNOSIS — Z7901 Long term (current) use of anticoagulants: Secondary | ICD-10-CM | POA: Insufficient documentation

## 2019-09-02 LAB — POCT I-STAT EG7
Acid-Base Excess: 5 mmol/L — ABNORMAL HIGH (ref 0.0–2.0)
Bicarbonate: 29.7 mmol/L — ABNORMAL HIGH (ref 20.0–28.0)
Calcium, Ion: 0.81 mmol/L — CL (ref 1.15–1.40)
HCT: 31 % — ABNORMAL LOW (ref 39.0–52.0)
Hemoglobin: 10.5 g/dL — ABNORMAL LOW (ref 13.0–17.0)
O2 Saturation: 77 %
Potassium: 4.5 mmol/L (ref 3.5–5.1)
Sodium: 139 mmol/L (ref 135–145)
TCO2: 31 mmol/L (ref 22–32)
pCO2, Ven: 45.9 mmHg (ref 44.0–60.0)
pH, Ven: 7.419 (ref 7.250–7.430)
pO2, Ven: 42 mmHg (ref 32.0–45.0)

## 2019-09-02 NOTE — ED Triage Notes (Signed)
Pt returns to ED after being discharged earlier tonight. C/o sob, states he wasn't able to make it home d/t sob. Sats 97% on RA. Due for dialysis tomorrow, had last session yesterday

## 2019-09-02 NOTE — Discharge Instructions (Addendum)
Go to dialysis today

## 2019-09-02 NOTE — ED Notes (Signed)
Patent O2 saturation at 96% while ambulating.

## 2019-09-02 NOTE — ED Notes (Addendum)
Pt ambulated in hallway with steady gait and NO assistance needed. Pts O2 stayed above 98% on RA.

## 2019-09-02 NOTE — ED Notes (Signed)
Pt is standing at doorway. Stated, "im waiting for the doctor to come back."

## 2019-09-02 NOTE — Discharge Instructions (Addendum)
PLEASE CALL YOUR DIALYSIS CENTER LATER THIS MORNING AT THIS TIME YOUR POTASSIUM IS NORMAL YOUR OXYGEN LEVEL IS NORMAL
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
## 2019-09-10 IMAGING — CR DG CHEST 1V
1 series · 1 of 1 positions shown · non-contrast
Comparison: None 918

CLINICAL DATA: Nausea, vomiting, and mid abdominal pain today.

EXAM:
CHEST 1 VIEW

[chest ap]
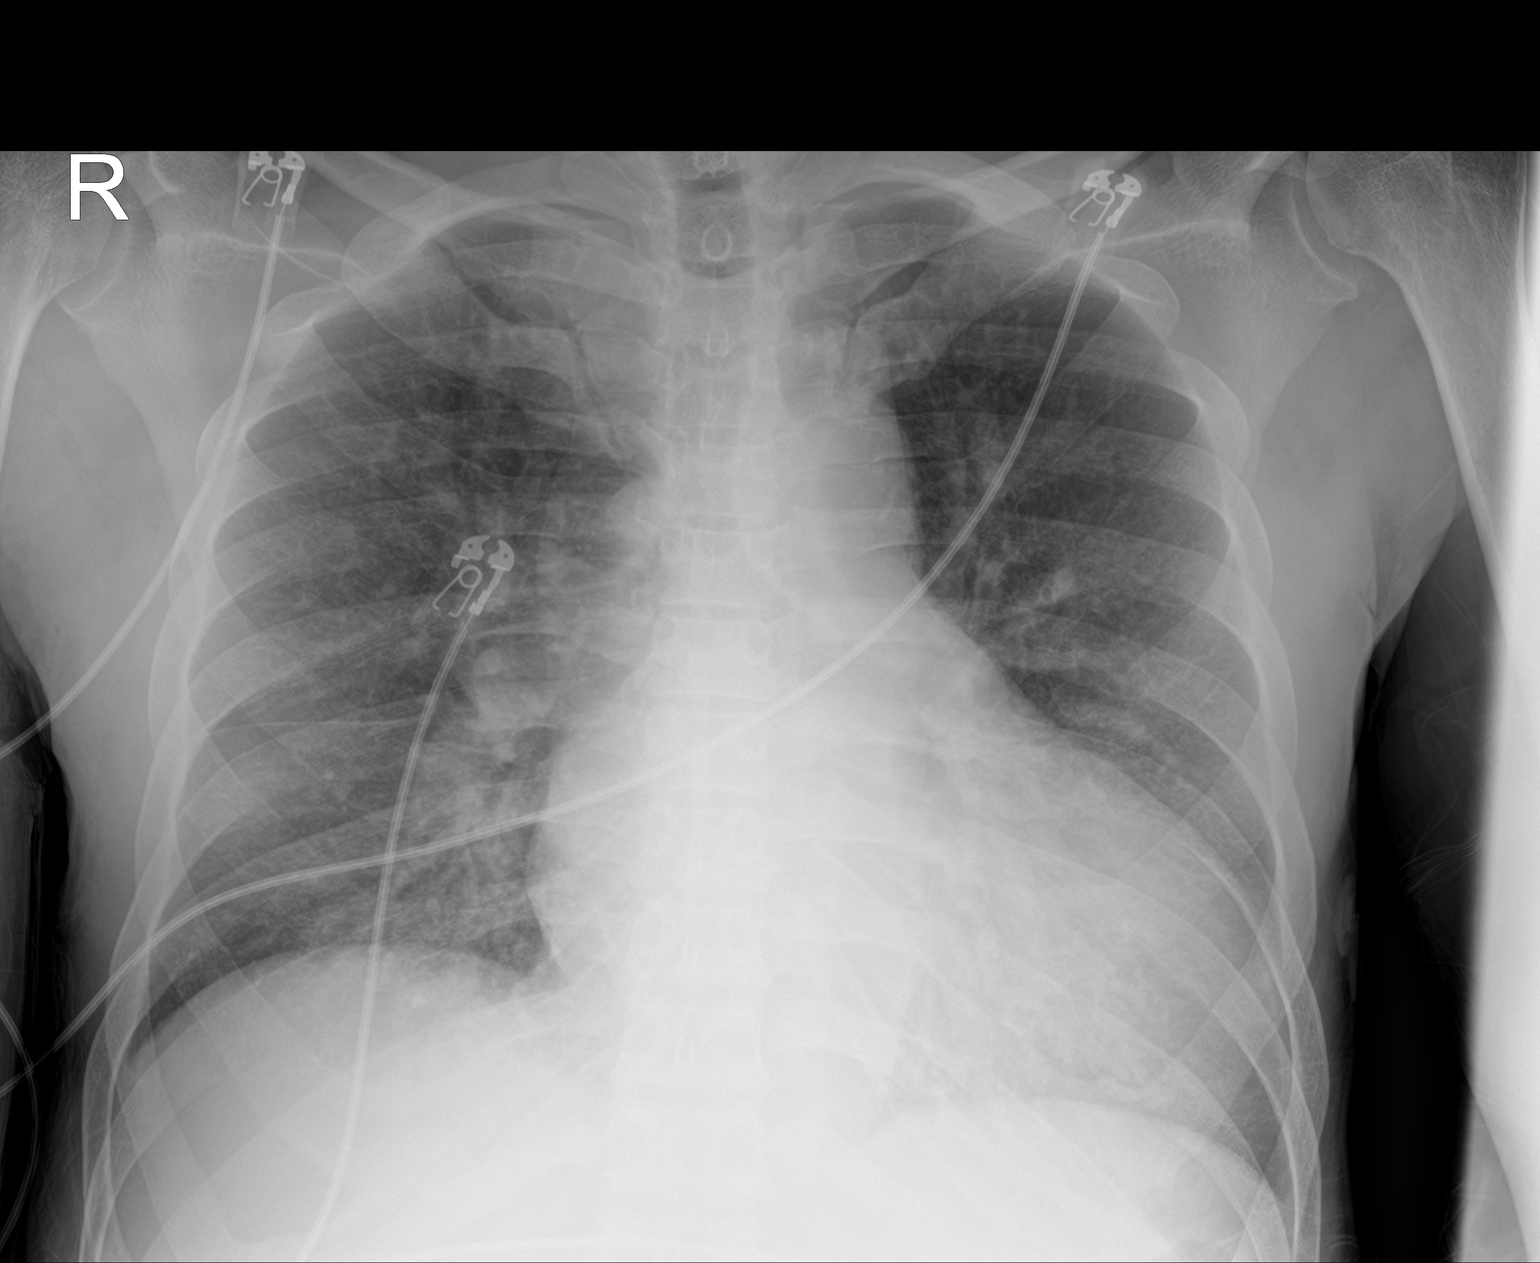

[1 of 1 positions shown; findings below may reference images not displayed]

FINDINGS: Cardiac enlargement with mild central pulmonary vascular congestion.
No edema or consolidation. No blunting of costophrenic angles. No
pneumothorax. Calcification of the aorta.
IMPRESSION: Cardiac enlargement with mild central pulmonary vascular congestion.
No edema or consolidation.

## 2019-09-16 IMAGING — DX DG CHEST 1V PORT
1 series · 1 of 1 positions shown · non-contrast
Comparison: 04/21/2017

CLINICAL DATA: Dialysis patient, reporting elevated potassium
levels and dyspnea after missing a treatment yesterday.

EXAM:
PORTABLE CHEST 1 VIEW

[chest ap]
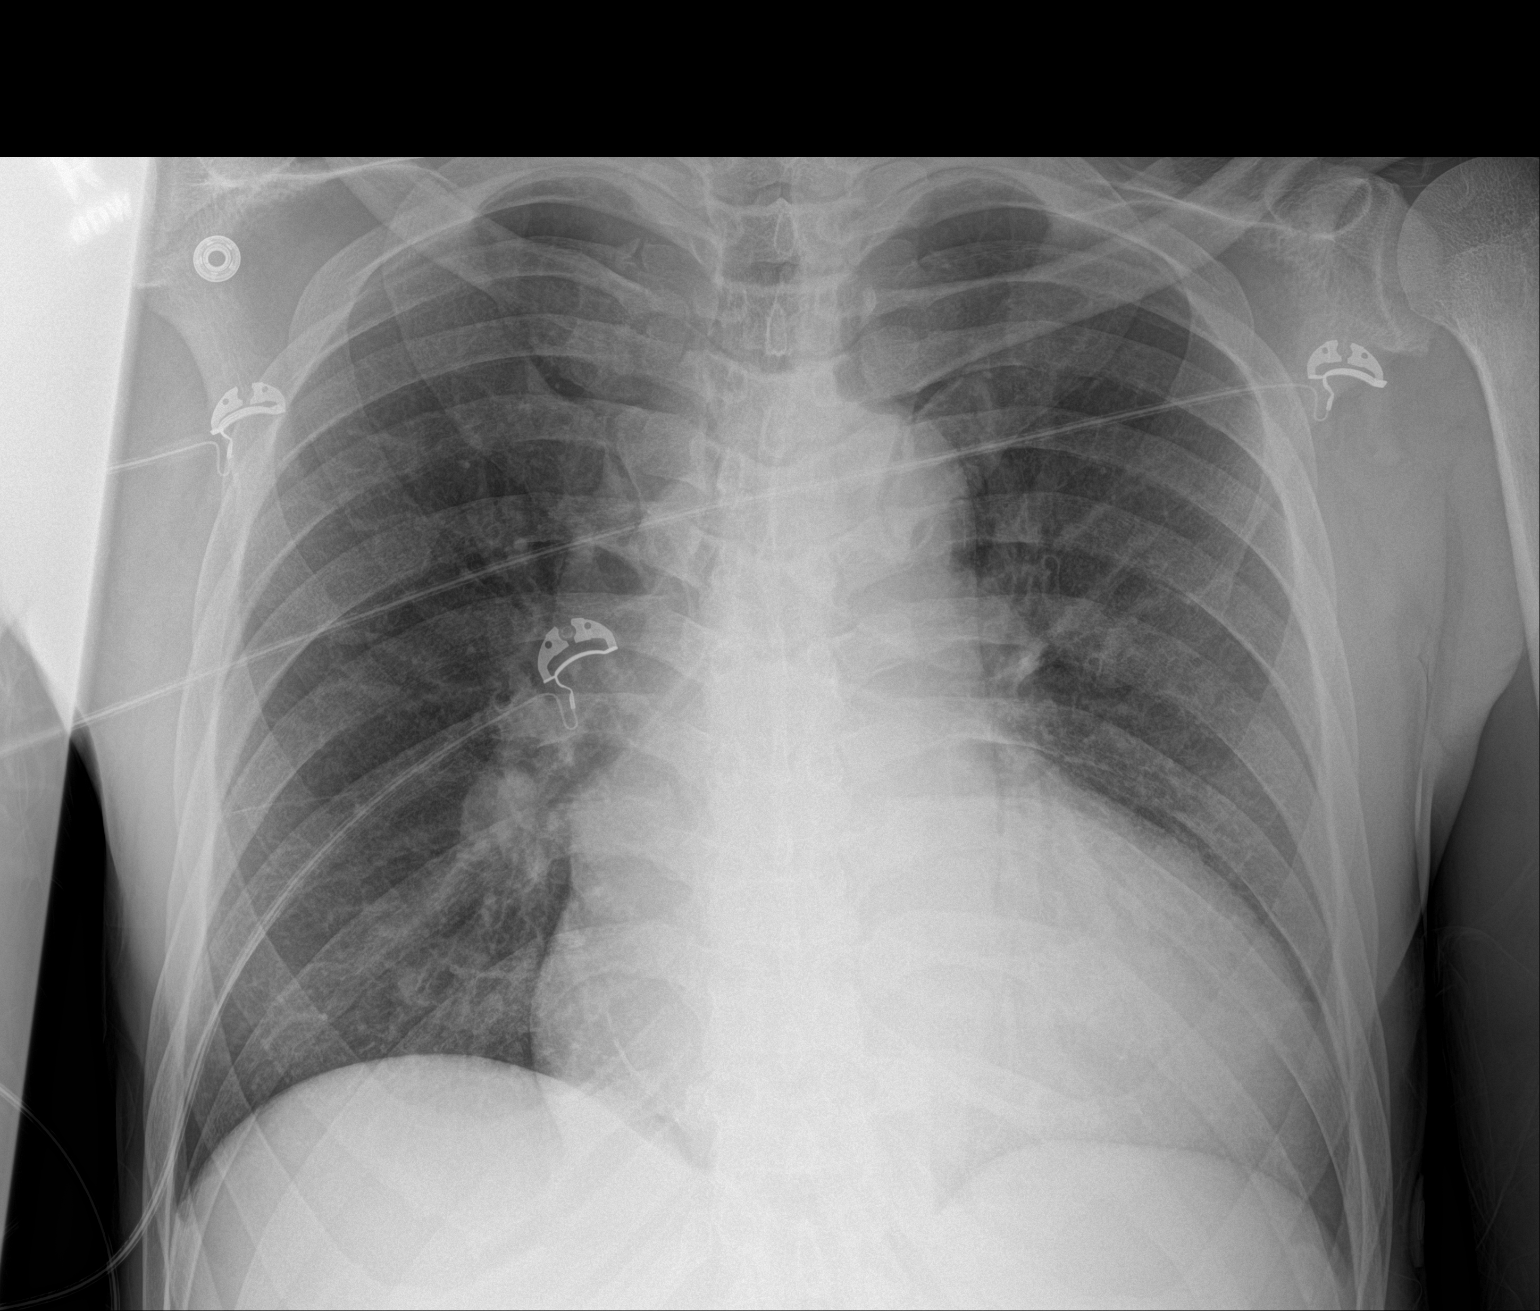

[1 of 1 positions shown; findings below may reference images not displayed]

FINDINGS: Unchanged moderate enlargement of the cardiac silhouette. The lungs
are clear. Normal pulmonary vasculature. Unremarkable mediastinal
and hilar contours. No pleural effusions are evident.
IMPRESSION: Stable cardiomegaly.  No consolidation or large effusion.

## 2019-09-20 IMAGING — CT CT ABD-PELV W/O CM
2 of 4 series · 16 of 46 positions shown, 18 images · non-contrast
Comparison: 04/20/2017

CLINICAL DATA: Acute abdominal pain, back pain, pancreatitis,
dialysis dependent

EXAM:
CT ABDOMEN AND PELVIS WITHOUT CONTRAST
TECHNIQUE: Multidetector CT imaging of the abdomen and pelvis was performed
following the standard protocol without IV contrast.

[Series 3: abd/ pelvis 5.0 i30f 2 · axial · 0.73mm/px · z∈[+670,+1066]mm · 13 of 87 slices shown, 15 images]
[im 4/87  soft-tissue]
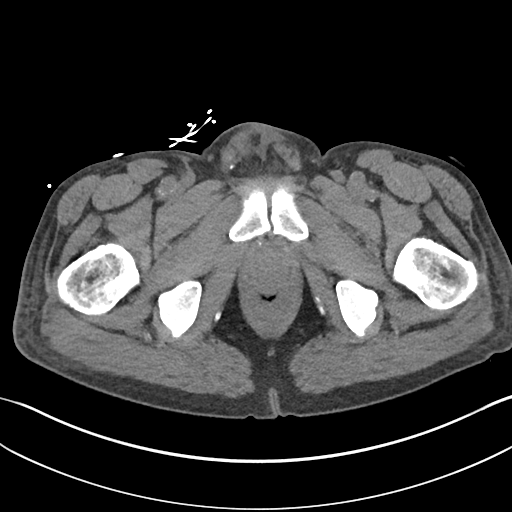
[im 4/87  bone]
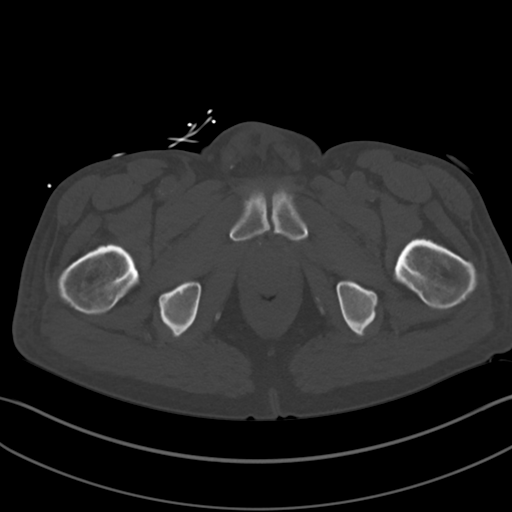
[im 12/87  soft-tissue]
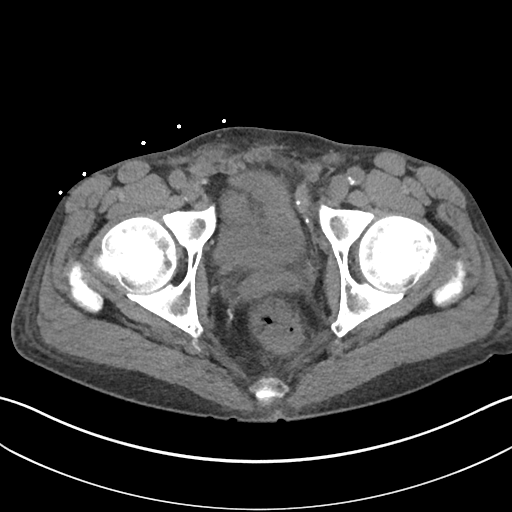
[im 19/87  soft-tissue]
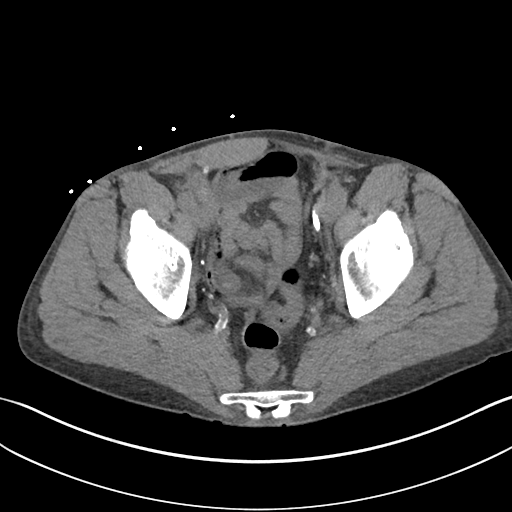
[im 23/87  soft-tissue]
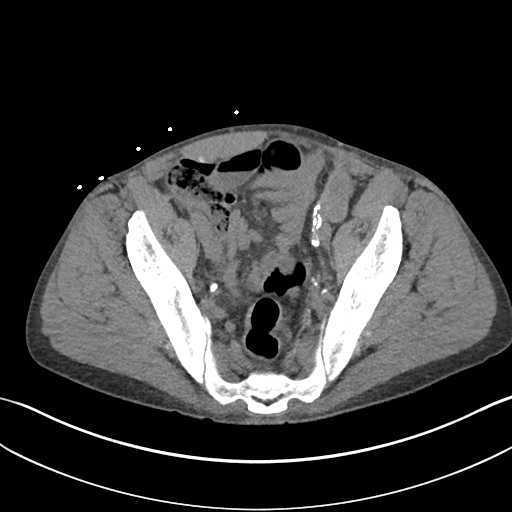
[im 30/87  soft-tissue]
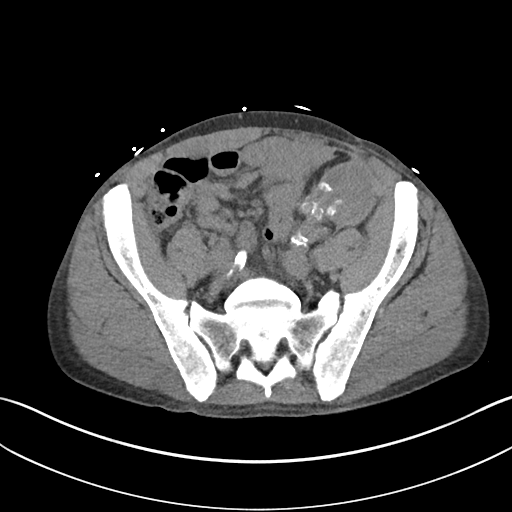
[im 38/87  soft-tissue]
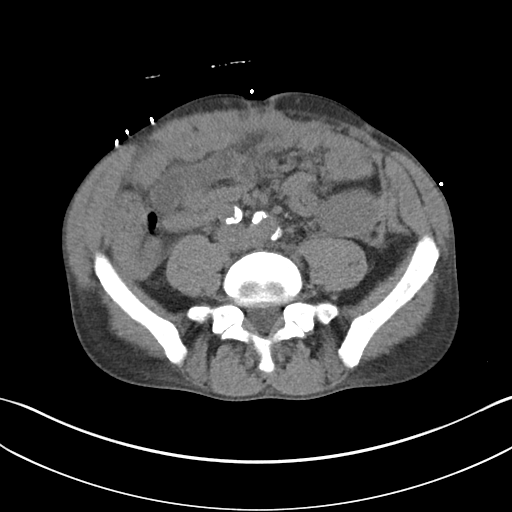
[im 45/87  soft-tissue]
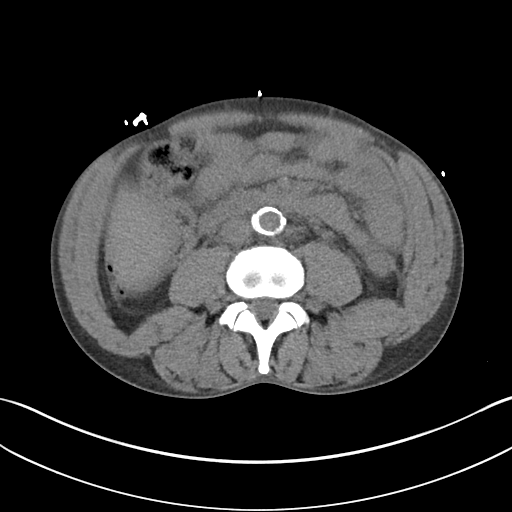
[im 49/87  soft-tissue]
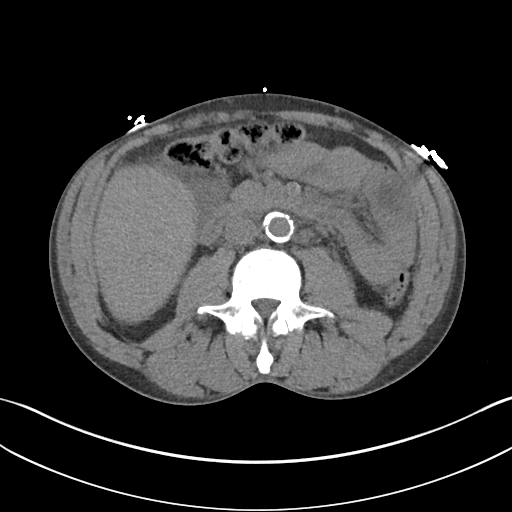
[im 57/87  soft-tissue]
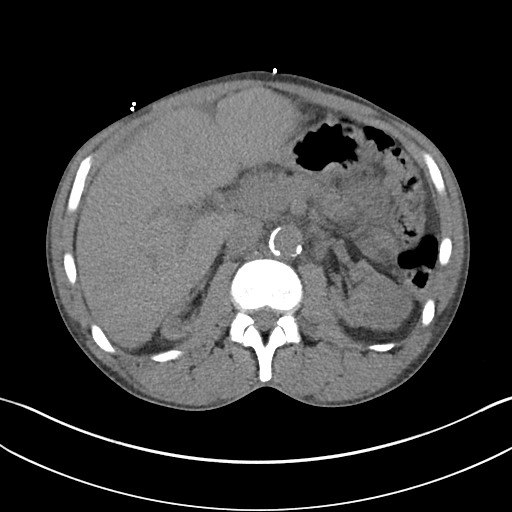
[im 57/87  bone]
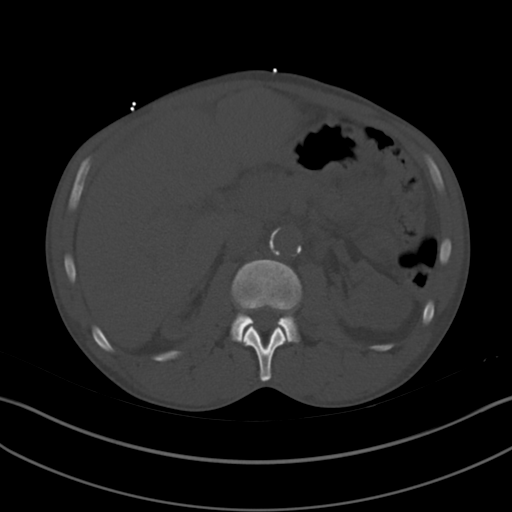
[im 64/87  soft-tissue]
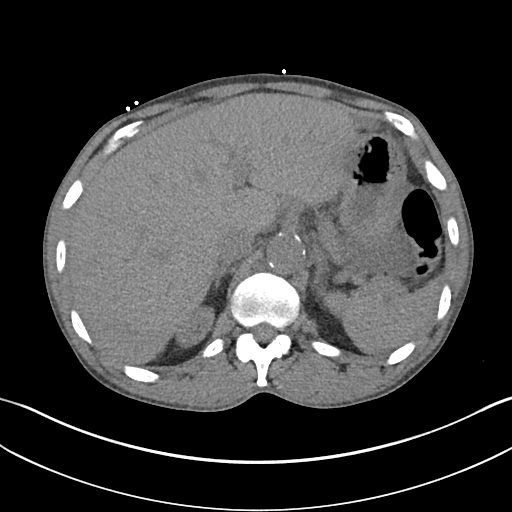
[im 68/87  soft-tissue]
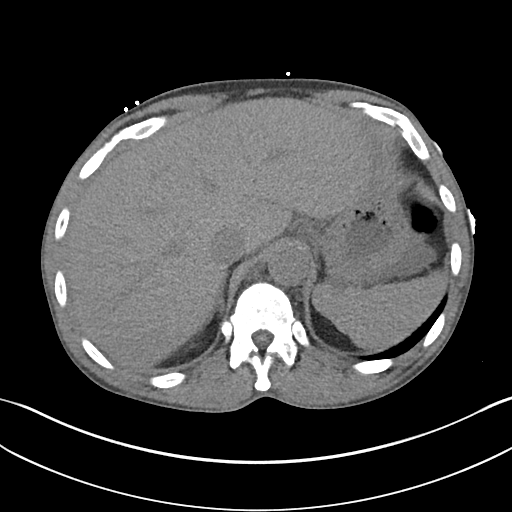
[im 75/87  soft-tissue]
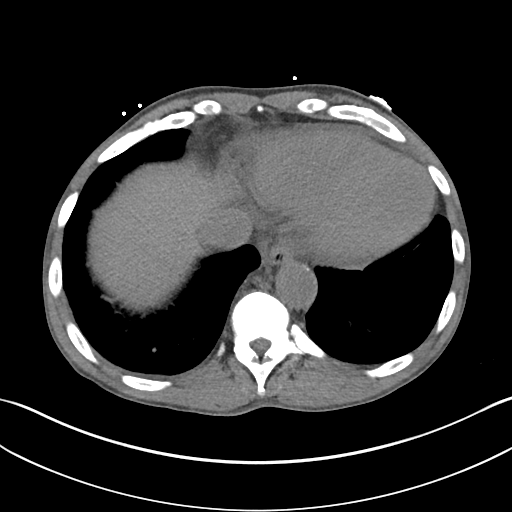
[im 83/87  soft-tissue]
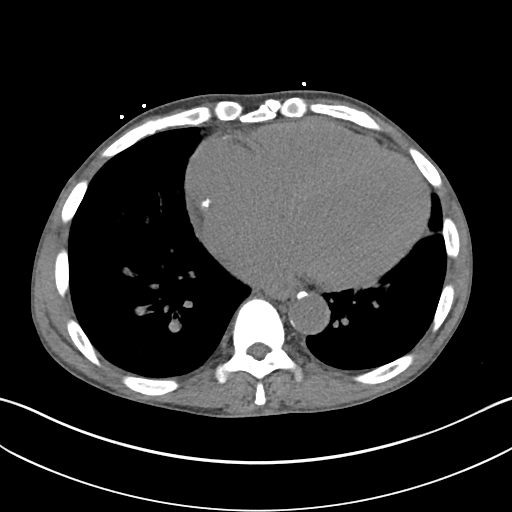

[Series 6: cor st · coronal · 0.72mm/px · 3 of 89 slices shown]
[im 30/89  soft-tissue]
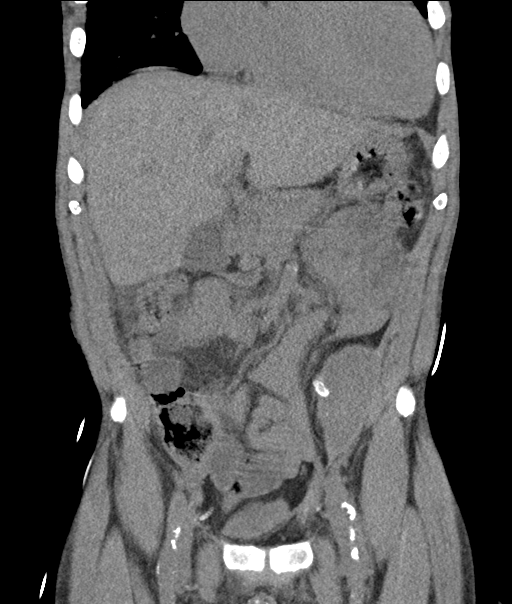
[im 40/89  soft-tissue]
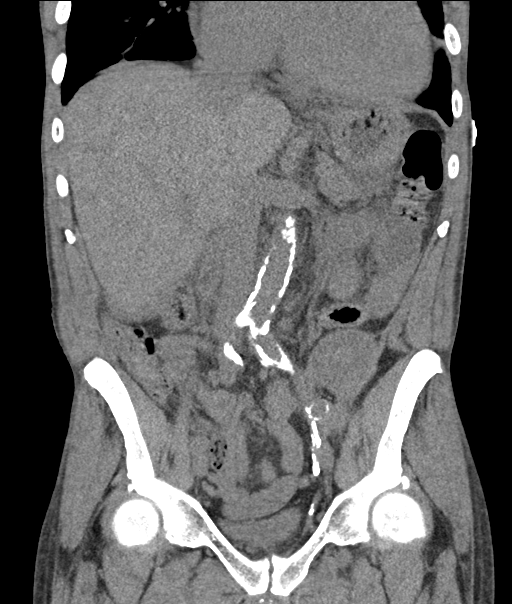
[im 49/89  soft-tissue]
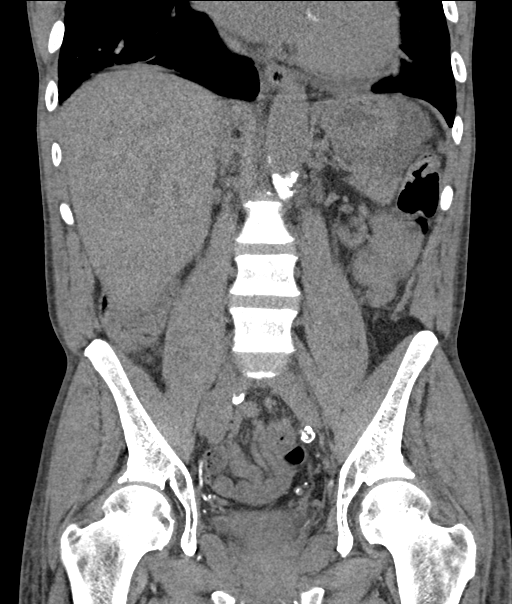

[16 of 46 positions shown; findings below may reference images not displayed]

FINDINGS: Lower chest: Marked cardiomegaly without significant pericardial
effusion. Similar bibasilar atelectasis. Trace right pleural
effusion. Atherosclerosis of the lower thoracic aorta.

Hepatobiliary: No definite focal hepatic abnormality or intrahepatic
biliary dilatation within the limits of noncontrast imaging.
Gallbladder and biliary system unremarkable. Trace perihepatic fluid
along the right inferior liver margin.

Pancreas: Small amount of peri pancreatic free fluid along the
pancreas body and tail compatible with pancreatitis. No ductal
dilatation. Limited assessment without contrast.

Spleen: Normal in size without focal abnormality.

Adrenals/Urinary Tract: Normal adrenal glands for age.

Chronic renal atrophy bilaterally of the native kidneys. Stable
indeterminate 4.4 cm left renal mass as noted before. No renal
obstruction or hydronephrosis. Chronic changes from a left pelvic
renal transplant. Bladder is collapsed.

Stomach/Bowel: Limited without oral contrast. Negative for bowel
obstruction, significant dilatation, ileus, free air. Limited with
motion artifact as well. No new fluid collection, definite abscess
or hemorrhage.

Vascular/Lymphatic: Extensive abdominopelvic atherosclerosis as
before. No significant aneurysm. Stable prominent retroperitoneal
nodes.

Reproductive: Seminal vesicles are not enlarged. Prostate gland
normal in size. Vascular calcifications noted.

Other: No inguinal or abdominal wall hernia.

Musculoskeletal: No acute osseous finding.
IMPRESSION: Small amount of peri pancreatic free fluid about the pancreas body
and tail compatible with mild pancreatitis. No formed fluid
collection, large pseudo cyst, or abscess. Trace perihepatic fluid
also noted. Limited assessment without IV contrast.

Stable cardiomegaly without CHF

Chronic renal atrophy and a remote left pelvic transplant kidney.
Chronic indeterminate 4.4 cm left renal mass.

Extensive abdominal and pelvic atherosclerosis

## 2019-09-21 ENCOUNTER — Inpatient Hospital Stay
Admission: AD | Admit: 2019-09-21 | Payer: Medicare Other | Source: Other Acute Inpatient Hospital | Admitting: Internal Medicine

## 2019-09-21 ENCOUNTER — Observation Stay (HOSPITAL_COMMUNITY)
Admission: EM | Admit: 2019-09-21 | Discharge: 2019-09-22 | Disposition: A | Discharge status: Home / Self Care | Payer: Medicare Other | Attending: Family Medicine | Admitting: Family Medicine

## 2019-09-21 ENCOUNTER — Observation Stay (HOSPITAL_COMMUNITY): Payer: Medicare Other

## 2019-09-21 DIAGNOSIS — K7460 Unspecified cirrhosis of liver: Secondary | ICD-10-CM | POA: Diagnosis not present

## 2019-09-21 DIAGNOSIS — Z7901 Long term (current) use of anticoagulants: Secondary | ICD-10-CM | POA: Diagnosis not present

## 2019-09-21 DIAGNOSIS — E1122 Type 2 diabetes mellitus with diabetic chronic kidney disease: Secondary | ICD-10-CM | POA: Insufficient documentation

## 2019-09-21 DIAGNOSIS — Z940 Kidney transplant status: Secondary | ICD-10-CM | POA: Diagnosis not present

## 2019-09-21 DIAGNOSIS — Z86711 Personal history of pulmonary embolism: Secondary | ICD-10-CM | POA: Diagnosis not present

## 2019-09-21 DIAGNOSIS — G4739 Other sleep apnea: Secondary | ICD-10-CM | POA: Diagnosis not present

## 2019-09-21 DIAGNOSIS — E875 Hyperkalemia: Principal | ICD-10-CM | POA: Diagnosis present

## 2019-09-21 DIAGNOSIS — Z20822 Contact with and (suspected) exposure to covid-19: Secondary | ICD-10-CM | POA: Insufficient documentation

## 2019-09-21 DIAGNOSIS — R531 Weakness: Secondary | ICD-10-CM | POA: Diagnosis not present

## 2019-09-21 DIAGNOSIS — M25552 Pain in left hip: Secondary | ICD-10-CM | POA: Insufficient documentation

## 2019-09-21 DIAGNOSIS — Z79899 Other long term (current) drug therapy: Secondary | ICD-10-CM | POA: Insufficient documentation

## 2019-09-21 DIAGNOSIS — I700 Atherosclerosis of aorta: Secondary | ICD-10-CM | POA: Insufficient documentation

## 2019-09-21 DIAGNOSIS — N186 End stage renal disease: Secondary | ICD-10-CM

## 2019-09-21 DIAGNOSIS — I10 Essential (primary) hypertension: Secondary | ICD-10-CM

## 2019-09-21 DIAGNOSIS — Z87891 Personal history of nicotine dependence: Secondary | ICD-10-CM | POA: Diagnosis not present

## 2019-09-21 DIAGNOSIS — Z992 Dependence on renal dialysis: Secondary | ICD-10-CM

## 2019-09-21 DIAGNOSIS — Z9115 Patient's noncompliance with renal dialysis: Secondary | ICD-10-CM | POA: Insufficient documentation

## 2019-09-21 DIAGNOSIS — I132 Hypertensive heart and chronic kidney disease with heart failure and with stage 5 chronic kidney disease, or end stage renal disease: Secondary | ICD-10-CM | POA: Diagnosis not present

## 2019-09-21 DIAGNOSIS — N2581 Secondary hyperparathyroidism of renal origin: Secondary | ICD-10-CM | POA: Insufficient documentation

## 2019-09-21 DIAGNOSIS — Z86718 Personal history of other venous thrombosis and embolism: Secondary | ICD-10-CM | POA: Insufficient documentation

## 2019-09-21 DIAGNOSIS — M549 Dorsalgia, unspecified: Secondary | ICD-10-CM | POA: Diagnosis not present

## 2019-09-21 DIAGNOSIS — E8889 Other specified metabolic disorders: Secondary | ICD-10-CM | POA: Insufficient documentation

## 2019-09-21 DIAGNOSIS — I5023 Acute on chronic systolic (congestive) heart failure: Secondary | ICD-10-CM | POA: Insufficient documentation

## 2019-09-21 DIAGNOSIS — I428 Other cardiomyopathies: Secondary | ICD-10-CM | POA: Insufficient documentation

## 2019-09-21 DIAGNOSIS — I160 Hypertensive urgency: Secondary | ICD-10-CM

## 2019-09-21 LAB — CBC WITH DIFFERENTIAL/PLATELET
Abs Immature Granulocytes: 0.04 10*3/uL (ref 0.00–0.07)
Basophils Absolute: 0 10*3/uL (ref 0.0–0.1)
Basophils Relative: 1 %
Eosinophils Absolute: 0.1 10*3/uL (ref 0.0–0.5)
Eosinophils Relative: 2 %
HCT: 28.6 % — ABNORMAL LOW (ref 39.0–52.0)
Hemoglobin: 9 g/dL — ABNORMAL LOW (ref 13.0–17.0)
Immature Granulocytes: 1 %
Lymphocytes Relative: 13 %
Lymphs Abs: 0.5 10*3/uL — ABNORMAL LOW (ref 0.7–4.0)
MCH: 27.6 pg (ref 26.0–34.0)
MCHC: 31.5 g/dL (ref 30.0–36.0)
MCV: 87.7 fL (ref 80.0–100.0)
Monocytes Absolute: 0.5 10*3/uL (ref 0.1–1.0)
Monocytes Relative: 13 %
Neutro Abs: 2.8 10*3/uL (ref 1.7–7.7)
Neutrophils Relative %: 70 %
Platelets: 130 10*3/uL — ABNORMAL LOW (ref 150–400)
RBC: 3.26 MIL/uL — ABNORMAL LOW (ref 4.22–5.81)
RDW: 15.9 % — ABNORMAL HIGH (ref 11.5–15.5)
WBC: 3.9 10*3/uL — ABNORMAL LOW (ref 4.0–10.5)
nRBC: 0 % (ref 0.0–0.2)

## 2019-09-21 LAB — BASIC METABOLIC PANEL
Anion gap: 20 — ABNORMAL HIGH (ref 5–15)
BUN: 77 mg/dL — ABNORMAL HIGH (ref 6–20)
CO2: 23 mmol/L (ref 22–32)
Calcium: 7.8 mg/dL — ABNORMAL LOW (ref 8.9–10.3)
Chloride: 93 mmol/L — ABNORMAL LOW (ref 98–111)
Creatinine, Ser: 14.26 mg/dL — ABNORMAL HIGH (ref 0.61–1.24)
GFR calc Af Amer: 4 mL/min — ABNORMAL LOW (ref >60)
GFR calc non Af Amer: 3 mL/min — ABNORMAL LOW (ref >60)
Glucose, Bld: 75 mg/dL (ref 70–99)
Potassium: 5.8 mmol/L — ABNORMAL HIGH (ref 3.5–5.1)
Sodium: 136 mmol/L (ref 135–145)

## 2019-09-21 LAB — RESPIRATORY PANEL BY PCR

## 2019-09-21 LAB — HEPATITIS B SURFACE ANTIGEN: Hepatitis B Surface Ag: NONREACTIVE

## 2019-09-21 LAB — SARS CORONAVIRUS 2 (TAT 6-24 HRS): SARS Coronavirus 2: NEGATIVE

## 2019-09-21 LAB — HEPATITIS B SURFACE ANTIBODY,QUALITATIVE: Hep B S Ab: REACTIVE — AB

## 2019-09-21 MED ORDER — SODIUM ZIRCONIUM CYCLOSILICATE 10 G PO PACK
10.00 | PACK | ORAL | Status: DC
Start: ? — End: 2019-09-21

## 2019-09-21 MED ORDER — PROCHLORPERAZINE MALEATE 10 MG PO TABS
10.0000 mg | ORAL_TABLET | Freq: Two times a day (BID) | ORAL | Status: DC | PRN
  Filled 2019-09-21: qty 1

## 2019-09-21 MED ORDER — DIPHENHYDRAMINE HCL 25 MG PO CAPS: 25.0000 mg | ORAL_CAPSULE | Freq: Three times a day (TID) | ORAL | Status: DC | PRN

## 2019-09-21 MED ORDER — HYDRALAZINE HCL 50 MG PO TABS
100.0000 mg | ORAL_TABLET | Freq: Three times a day (TID) | ORAL | Status: DC
  Administered 2019-09-21: 100 mg via ORAL
  Filled 2019-09-21: qty 2

## 2019-09-21 MED ORDER — NEPHRO-VITE 0.8 MG PO TABS
1.0000 | ORAL_TABLET | ORAL | Status: DC
  Administered 2019-09-21: 1 via ORAL
  Filled 2019-09-21 (×3): qty 1

## 2019-09-21 MED ORDER — AMLODIPINE BESYLATE 5 MG PO TABS
5.0000 mg | ORAL_TABLET | Freq: Every day | ORAL | Status: DC
  Administered 2019-09-22: 5 mg via ORAL
  Filled 2019-09-21: qty 1

## 2019-09-21 MED ORDER — HEPARIN SODIUM (PORCINE) 5000 UNIT/ML IJ SOLN
5000.0000 [IU] | Freq: Three times a day (TID) | INTRAMUSCULAR | Status: DC
  Administered 2019-09-21: 5000 [IU] via SUBCUTANEOUS
  Filled 2019-09-21: qty 1

## 2019-09-21 MED ORDER — LANTHANUM CARBONATE 500 MG PO CHEW
1000.0000 mg | CHEWABLE_TABLET | Freq: Three times a day (TID) | ORAL | Status: DC
  Administered 2019-09-21 – 2019-09-22 (×2): 1000 mg via ORAL
  Filled 2019-09-21 (×4): qty 2

## 2019-09-21 MED ORDER — CARVEDILOL 25 MG PO TABS
25.0000 mg | ORAL_TABLET | Freq: Two times a day (BID) | ORAL | Status: DC
  Administered 2019-09-21 – 2019-09-22 (×2): 25 mg via ORAL
  Filled 2019-09-21: qty 2
  Filled 2019-09-21: qty 1

## 2019-09-21 MED ORDER — HYDROMORPHONE HCL 1 MG/ML IJ SOLN
0.5000 mg | Freq: Once | INTRAMUSCULAR | Status: AC
  Administered 2019-09-21: 0.5 mg via INTRAVENOUS
  Filled 2019-09-21: qty 1

## 2019-09-21 MED ORDER — ONDANSETRON HCL 4 MG/2ML IJ SOLN
4.0000 mg | Freq: Four times a day (QID) | INTRAMUSCULAR | Status: DC | PRN
  Administered 2019-09-22: 4 mg via INTRAVENOUS
  Filled 2019-09-21 (×2): qty 2

## 2019-09-21 MED ORDER — SUCRALFATE 1 GM/10ML PO SUSP: 1.0000 g | Freq: Three times a day (TID) | ORAL | Status: DC

## 2019-09-21 MED ORDER — OXYCODONE HCL 5 MG PO TABS
15.0000 mg | ORAL_TABLET | ORAL | Status: DC | PRN
  Administered 2019-09-22: 15 mg via ORAL
  Filled 2019-09-21 (×3): qty 3

## 2019-09-21 MED ORDER — SODIUM THIOSULFATE 25 % IV SOLN: 25.0000 g | INTRAVENOUS | Status: DC

## 2019-09-21 MED ORDER — CHLORHEXIDINE GLUCONATE CLOTH 2 % EX PADS
6.0000 | MEDICATED_PAD | Freq: Every day | CUTANEOUS | Status: DC
  Administered 2019-09-22: 6 via TOPICAL

## 2019-09-21 MED ORDER — CARVEDILOL 25 MG PO TABS
25.00 | ORAL_TABLET | ORAL | Status: DC
Start: 2019-09-21 — End: 2019-09-21

## 2019-09-21 MED ORDER — PANTOPRAZOLE SODIUM 40 MG PO TBEC
40.0000 mg | DELAYED_RELEASE_TABLET | ORAL | Status: DC
  Administered 2019-09-21: 40 mg via ORAL
  Filled 2019-09-21: qty 1

## 2019-09-21 MED ORDER — SODIUM ZIRCONIUM CYCLOSILICATE 10 G PO PACK: 10.0000 g | PACK | Freq: Once | ORAL | Status: DC

## 2019-09-21 MED ORDER — SENNOSIDES-DOCUSATE SODIUM 8.6-50 MG PO TABS
2.0000 | ORAL_TABLET | Freq: Every day | ORAL | Status: DC
  Administered 2019-09-21: 2 via ORAL
  Filled 2019-09-21: qty 2

## 2019-09-21 MED ORDER — CHLORHEXIDINE GLUCONATE CLOTH 2 % EX PADS: 6.0000 | MEDICATED_PAD | Freq: Every day | CUTANEOUS | Status: DC

## 2019-09-21 NOTE — ED Notes (Signed)
Pt refusing xray at this time. Stating "I am too weak".
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
## 2019-09-21 NOTE — ED Triage Notes (Signed)
Pt transferred from [facility] via EMS for abnormal labs. Pt missed dialysis yesterday due to lower back pain. Here today with K of 6.0, creat 13.4. Given fentanyl, oxycodone, and decadron for back pain which pt states did not help. Also given hydralazine for hypertension with good effect.

## 2019-09-21 NOTE — Progress Notes (Signed)
Pt arrived to room 6N04 via stretcher from the ED. Received report from [place], Therapist, sports. See assessment. Will continue to monitor.
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
## 2019-09-21 NOTE — Progress Notes (Addendum)
Spoke with floor Rn., [employer], he informed me that Covid respiratory panel was sent of a little before 20:00. Results are pending at this time.

## 2019-09-22 DIAGNOSIS — E875 Hyperkalemia: Secondary | ICD-10-CM | POA: Diagnosis not present

## 2019-09-22 LAB — RENAL FUNCTION PANEL
Albumin: 3.1 g/dL — ABNORMAL LOW (ref 3.5–5.0)
Anion gap: 20 — ABNORMAL HIGH (ref 5–15)
BUN: 85 mg/dL — ABNORMAL HIGH (ref 6–20)
CO2: 23 mmol/L (ref 22–32)
Calcium: 8 mg/dL — ABNORMAL LOW (ref 8.9–10.3)
Chloride: 93 mmol/L — ABNORMAL LOW (ref 98–111)
Creatinine, Ser: 15.61 mg/dL — ABNORMAL HIGH (ref 0.61–1.24)
GFR calc Af Amer: 4 mL/min — ABNORMAL LOW (ref >60)
GFR calc non Af Amer: 3 mL/min — ABNORMAL LOW (ref >60)
Glucose, Bld: 73 mg/dL (ref 70–99)
Phosphorus: 8.1 mg/dL — ABNORMAL HIGH (ref 2.5–4.6)
Potassium: 6 mmol/L — ABNORMAL HIGH (ref 3.5–5.1)
Sodium: 136 mmol/L (ref 135–145)

## 2019-09-22 LAB — CBC
HCT: 25 % — ABNORMAL LOW (ref 39.0–52.0)
Hemoglobin: 8.1 g/dL — ABNORMAL LOW (ref 13.0–17.0)
MCH: 28.1 pg (ref 26.0–34.0)
MCHC: 32.4 g/dL (ref 30.0–36.0)
MCV: 86.8 fL (ref 80.0–100.0)
Platelets: 103 10*3/uL — ABNORMAL LOW (ref 150–400)
RBC: 2.88 MIL/uL — ABNORMAL LOW (ref 4.22–5.81)
RDW: 15.5 % (ref 11.5–15.5)
WBC: 3.5 10*3/uL — ABNORMAL LOW (ref 4.0–10.5)
nRBC: 0 % (ref 0.0–0.2)

## 2019-09-22 MED ORDER — SODIUM THIOSULFATE 25 % IV SOLN
25.0000 g | INTRAVENOUS | Status: DC
  Administered 2019-09-22: 25 g via INTRAVENOUS
  Filled 2019-09-22 (×3): qty 100

## 2019-09-22 MED ORDER — RENA-VITE PO TABS: 1.0000 | ORAL_TABLET | ORAL | Status: DC

## 2019-09-22 NOTE — Progress Notes (Signed)
Pt returned to 6N04 via bed after dialysis. Received report from Murriel Hopper, Therapist, sports. See assessment. Will continue to monitor.

## 2019-09-22 NOTE — Progress Notes (Addendum)
[facility] Progress Note   Subjective:   Patient seen on HD. Refused HD last night due to not receiving pain med of choice. Reports SOB but O2 nasal cannula was out of nares, repositioned. Still c/o hip/back pain. Leg weakness improved. Denies CP, palpitations, abdominal pain, N/V/D.  Objective Vitals:   09/22/19 0730 09/22/19 0800 09/22/19 0830 09/22/19 0900  BP: (!) 189/82 (!) 202/90 (!) 157/85 (!) 113/98  Pulse: 62 69 70 (!) 50  Resp:      Temp:      TempSrc:      SpO2:      Weight:       Physical Exam General: Well developed, well nourished male. Alert and in NAD Heart: RRR, no murmurs, rubs or gallops Lungs: CTA bilaterally without wheezing, rhonchi or rales Abdomen: Soft, non-tender, non-distended. +BS Extremities: Trace edema bilateral lower extremities Dialysis Access: LUE AVF accessed  Additional Objective Labs: Basic Metabolic Panel: Recent Labs  Lab 09/21/19 1316 09/22/19 0705  NA 136 136  K 5.8* 6.0*  CL 93* 93*  CO2 23 23  GLUCOSE 75 73  BUN 77* 85*  CREATININE 14.26* 15.61*  CALCIUM 7.8* 8.0*  PHOS  --  8.1*   Liver Function Tests: Recent Labs  Lab 09/22/19 0705  ALBUMIN 3.1*   CBC: Recent Labs  Lab 09/21/19 1316 09/22/19 0705  WBC 3.9* 3.5*  NEUTROABS 2.8  --   HGB 9.0* 8.1*  HCT 28.6* 25.0*  MCV 87.7 86.8  PLT 130* 103*   Blood Culture    Component Value Date/Time   SDES PLEURAL 07/29/2018 1300   SDES PLEURAL 07/29/2018 1300   SPECREQUEST NONE 07/29/2018 1300   SPECREQUEST NONE 07/29/2018 1300   CULT  07/29/2018 1300    NO GROWTH 5 DAYS Performed at [facility], [place] [address], [place], [place] [address]    REPTSTATUS 08/03/2018 FINAL 07/29/2018 1300   REPTSTATUS 07/29/2018 FINAL 07/29/2018 1300     Studies/Results: DG HIP UNILAT WITH PELVIS 2-3 VIEWS LEFT  Result Date: 09/21/2019 CLINICAL DATA:  54 year old male with left hip pain. EXAM: DG HIP (WITH OR WITHOUT PELVIS) 2-3V LEFT COMPARISON:  CT of  the abdomen pelvis dated 07/08/2019. FINDINGS: There is no acute fracture or dislocation. Mild arthritic changes of the hips bilaterally. Calcification in the left hemipelvis corresponds to the renal transplant. The soft tissues are unremarkable. IMPRESSION: No acute fracture or dislocation. Electronically Signed   By: Anner Crete M.D.   On: 09/21/2019 16:28   Medications: . sodium thiosulfate infusion for calciphylaxis     . amLODipine  5 mg Oral Daily  . b complex-vitamin c-folic acid  1 tablet Oral Q24H  . carvedilol  25 mg Oral BID  . Chlorhexidine Gluconate Cloth  6 each Topical Q0600  . Chlorhexidine Gluconate Cloth  6 each Topical Q0600  . heparin  5,000 Units Subcutaneous Q8H  . hydrALAZINE  100 mg Oral TID  . lanthanum  1,000 mg Oral TID WC  . pantoprazole  40 mg Oral Q24H  . senna-docusate  2 tablet Oral QHS  . sodium zirconium cyclosilicate  10 g Oral Once    Dialysis Orders: [facility] on MWF. MonWedFri, 4 hrs 0 min, 180NRe Optiflux, BFR 400, DFR Manual 800 mL/min, EDW 76 (kg), Dialysate 2.0 K, 2.0 Ca Access: AVFistula-Standard Venofer 50 mg IV q week Mircera 225 mcg IV q 2 weeks- last dose 09/18/19 Sodium Thiosulfate 25% 25g IV q treatment  Assessment/Plan: 1. Shortness  of breath: Secondary to missed HD. Well over EDW. UFG increased to 4L today as patient refused HD last night. Edema improving on HD.  2. Hyperkalemia: Due to missed/shortened HD. K+ 6.0 > 5.8. Dialysis today with 1K bath for 1 hour then 2K bath 3. Leg weakness/ back pain: Leg weakness possibly due to hyperkalemia, improving. Hip xray without any acute changes. Further work up of back pain per primary team.  4.  ESRD: Will continue MWF schedule with next HD 3/5.  5. Hx calciphylaxis: On Sodium thiosulfate, resume with HD today. No calcium products.  6.  Hypertension/volume: BP elevated in setting of noncompliance. Resume home BP meds, volume reduction with HD today.  7.  Anemia: Hgb  8.7 > 9.0 > 8.1. ESA just dosed, follow.  8.  Metabolic bone disease: Calcium 8.0/ 8.7 corrected. No calcium products with hx calciphylaxis. Phos elevated likely due to binder noncompliance. Binder resumed here. 9.  Nutrition:  Renal diet with fluid restrictions.   Anice Paganini, PA-C 09/22/2019, 9:16 AM  [facility] Pager: [phone]  Nephrology attending: Patient was seen and examined at dialysis unit.  He is tolerating dialysis well.  He refused dialysis last night because of insisting on IV narcotics.  He looks comfortable.  We will increase UF goal.  Needs adherence with outpatient dialysis treatment.  Next HD on 3/5.  Okay to discharge from renal perspective.  Lawson Radar, MD [facility]

## 2019-09-22 NOTE — Progress Notes (Signed)
RN paged because pt said he was not going to HD unless he got Dilaudid. NP reviewed chart. Pt came in with hip pain and xray was negative. He is on his home Oxy 27m and is refusing to take that. He has no acute pain diagnosis to warrant Dilaudid. Prescribing Dilaudid in this instance is against [facility] pain policy. I explained this to RN and he will explain to pt. RN to call back if problems. KJKG, NP [facility]

## 2019-09-22 NOTE — Progress Notes (Signed)
Patient refused HD Tx r/t not receiving the pain medication of his choice.

## 2019-09-22 NOTE — Progress Notes (Signed)
Charge RN called this RN informing me that patient had changed his mind and wanted to go to HemoD now.  Called HemoD RN informing of the patient changing his mind and wanting to go for HemoD but HemoD RN told this RN that he cannot have the HemoD now since she already sent back the staff going to do the HemoD and will have it done later today.  Patient was made aware of the situation and was upset.

## 2019-09-22 NOTE — Progress Notes (Signed)
Patient refused 12 Lead EKG and vitals.  Will make Hemo RN aware.

## 2019-09-22 NOTE — Progress Notes (Signed)
PT Cancellation Note  Patient Details Name: Frank [name] MRN: [id number] DOB: Feb 11, 1966   Cancelled Treatment:    Reason Eval/Treat Not Completed: Patient declined, no reason specified;Other (comment). PT was consulted for evaluation this PM, but upon talking to RN, the pt was cleared for d/c and awaiting transport so PT eval is not needed at this time. Thank you for the consult.   Karma Ganja, PT, DPT   Acute Rehabilitation Department Pager #: [phone]   Otho Bellows 09/22/2019, 3:19 PM

## 2019-09-22 NOTE — Discharge Instructions (Signed)
Hyperkalemia Hyperkalemia is when you have too much potassium in your blood. Potassium helps your body in many ways, but having too much can cause problems. If there is too much potassium in your blood, it can affect how your heart works. Potassium is normally removed from your body by your kidneys. Many things can cause the amount in your blood to be high. Medicines and other treatments can be used to bring the amount to a normal level. Treatment may need to be done in the hospital. Follow these instructions at home:   Take over-the-counter and prescription medicines only as told by your doctor.  Do not take any of the following unless your doctor says it is okay: ? Supplements. ? Natural products. ? Herbs. ? Vitamins.  Limit how much alcohol you drink as told by your doctor.  Do not use drugs. If you need help quitting, ask your doctor.  If you have kidney disease, you may need to follow a low-potassium diet. A food specialist (dietitian) can help you.  Keep all follow-up visits as told by your doctor. This is important. Contact a doctor if:  Your heartbeat is not regular or is very slow.  You feel dizzy (light-headed).  You feel weak.  You feel sick to your stomach (nauseous).  You have tingling in your hands or feet.  You lose feeling (have numbness) in your hands or feet. Get help right away if:  You are short of breath.  You have chest pain.  You pass out (faint).  You cannot move your muscles. Summary  Hyperkalemia is when you have too much potassium in your blood.  Take over-the-counter and prescription medicines only as told by your doctor.  Limit how much alcohol you drink as told by your doctor.  Contact a doctor if your heartbeat is not regular. This information is not intended to replace advice given to you by your health care provider. Make sure you discuss any questions you have with your health care provider. Document Revised: 06/23/2017 Document  Reviewed: 06/23/2017 Elsevier Patient Education  [place].

## 2019-09-22 NOTE — Progress Notes (Signed)
AVS given to pt. Pt refused discharge teaching. Pt to be escorted off the unit with all belongings via wheelchair by staff member.

## 2019-09-22 NOTE — Progress Notes (Signed)
Patient refused to go for hemodialysis without the Dilaudid IV PRN pain medication despite explaining to pt. of the need for the dialysis, patient has 41m Oxy IR PRN pain med but refused it.  Patient also informed this RN that he was waiting for the HemoD in the afternoon but according to the HemoD RN, still waiting for the result of the respiratory panel which was done around 2000 with negative result and cannot get the patient until after midnight since attending to another HemD patient. On call KTylene Fantasiaand HemoD RN were made aware and HemoD RN informed this RN that patient will have his Hemo later today.

## 2019-09-22 NOTE — Progress Notes (Signed)
PT Cancellation Note  Patient Details Name: Frank [name] MRN: [id number] DOB: October 15, 1965   Cancelled Treatment:    Reason Eval/Treat Not Completed: Patient at procedure or test/unavailable   Currently in HD;  Will follow up later today as time allows;  Otherwise, will follow up for PT tomorrow;   Thank you,  Roney Marion, PT  Acute Rehabilitation Services Pager [phone] Office [phone]     Colletta [place] 09/22/2019, 7:50 AM

## 2019-09-22 NOTE — Discharge Summary (Signed)
Physician Discharge Summary  Frank [name] VQQ:[id number] DOB: 25-Oct-1965 DOA: 09/21/2019  PCP: Patient, [name]  Admit date: 09/21/2019 Discharge date: 09/22/2019  Admitted From: Home Disposition: Home  Recommendations for Outpatient Follow-up:  1. Follow up with PCP in 1-2 weeks 2. Follow with outpatient dialysis.  Next dialysis on 09/24/2019 3. Please obtain BMP/CBC in one week 4. Please follow up on the following pending results:  Home Health: None Equipment/Devices: None  Discharge Condition: Stable CODE STATUS: Full code Diet recommendation: Cardiorenal  Subjective: Seen this morning in dialysis unit.  Did not want to talk at all.  Said " leave me alone" but I wanted to talk to him.  HPI: Frank [name] is a 54 y.o. male with medical history significant of ESRD, HTN, chronic systolic CHF, with frequent ED visits due to noncompliance, chronic pancreatitis, and cirrhosis who presented to [facility] as a transfer from [facility] for evaluation of SOB, nausea and vomiting. He stated that he had only half session of dialysis on Saturday, but later he changed his medic at he never went to start restart since because of the left hip pain.  Stated that the left hip pain started on Friday, constant 10 out of 10, nonradiating, significantly limited range of motion.  Denies any fever chills. This morning he developed SOB, nausea and vomiting. Denies abdominal pain, diarrhea, headache, vision changes, chest pain, and heart palpitations.  ED Course:  BP 200/84, Labs revealed Hgb 8.7 > 9.0, WBC 3.7, K+ 6.0 > 5.8, BUN 70, Cr 13.42, Ca 8.1had diffuse bilateral pulmonary infiltrates/edema with small to moderate pleural effusion on chest x-ray.  Fluid overload  Brief/Interim Summary: Patient was admitted with hypertensive urgency, pulmonary edema, fluid overload, hyperkalemia and all of those were secondary to him being noncompliant with his dialysis and his medications.  He in fact refused his  dialysis yesterday which was urgently needed.  The reason for his refusal was because he was not given his " pain medication of choice, Dilaudid".  He underwent dialysis this morning.  When I went to see him, he covered himself in the sheets and did not want to talk to me and said "leave me alone" .  He did not allow me to examine him either.  He has been cleared by nephrologist to be discharged.  Per chart review and verification by nurse, he is on room air currently and has no symptoms.  He is being discharged in stable condition.  Discharge Diagnoses:  Active Problems:   Hyperkalemia    Discharge Instructions  Discharge Instructions    Discharge patient   Complete by: As directed    Discharge disposition: 01-Home or Self Care   Discharge patient date: 09/22/2019     Allergies as of 09/22/2019      Reactions   Butalbital-apap-caffeine Shortness Of Breath, Swelling, Other (See Comments)   Swelling in throat   Ferrlecit [na Ferric Gluc Cplx In Sucrose] Shortness Of Breath, Swelling, Other (See Comments)   Swelling in throat, tolerates Venofor   Minoxidil Shortness Of Breath   Tylenol [acetaminophen] Anaphylaxis, Swelling   Darvocet [propoxyphene N-acetaminophen] Hives      Medication List    TAKE these medications   amLODipine 5 MG tablet Commonly known as: NORVASC Take 5 mg by mouth daily.   carvedilol 25 MG tablet Commonly known as: COREG Take 25 mg by mouth 2 (two) times daily.   diphenhydrAMINE 25 mg capsule Commonly known as: BENADRYL Take 25 mg by  mouth every 8 (eight) hours as needed for itching.   hydrALAZINE 100 MG tablet Commonly known as: APRESOLINE Take 1 tablet (100 mg total) by mouth 3 (three) times daily.   lanthanum 1000 MG chewable tablet Commonly known as: FOSRENOL Chew 1 tablet (1,000 mg total) by mouth 3 (three) times daily with meals.   Nephro Vitamins 0.8 MG Tabs Take 1 tablet by mouth daily.   nitroGLYCERIN 0.4 MG SL tablet Commonly known as:  NITROSTAT Place 1 tablet (0.4 mg total) under the tongue every 5 (five) minutes as needed for chest pain.   omeprazole 20 MG capsule Commonly known as: PRILOSEC Take 20 mg by mouth daily.   ondansetron 4 MG tablet Commonly known as: ZOFRAN Take 4 mg by mouth every 4 (four) hours as needed for nausea or vomiting.   oxyCODONE 15 MG immediate release tablet Commonly known as: ROXICODONE Take 15 mg by mouth every 4 (four) hours as needed for pain.   pantoprazole 40 MG tablet Commonly known as: Protonix Take 1 tablet (40 mg total) by mouth 2 (two) times daily before a meal.   prochlorperazine 10 MG tablet Commonly known as: COMPAZINE Take 1 tablet (10 mg total) by mouth 2 (two) times daily as needed for nausea or vomiting.   prochlorperazine 25 MG suppository Commonly known as: COMPAZINE Place 1 suppository (25 mg total) rectally every 12 (twelve) hours as needed for nausea or vomiting.   scopolamine 1 MG/3DAYS Commonly known as: TRANSDERM-SCOP Place 1 patch onto the skin every 3 (three) days.   senna-docusate 8.6-50 MG tablet Commonly known as: Senokot-S Take 2 tablets by mouth at bedtime.   sucralfate 1 GM/10ML suspension Commonly known as: Carafate Take 10 mLs (1 g total) by mouth 4 (four) times daily -  with meals and at bedtime.      Follow-up Information    Lorretta Harp, MD Follow up in 1 week(s).   Specialties: Cardiology, Radiology Contact information: [address] [name] [address] [phone]          Allergies  Allergen Reactions  . Butalbital-Apap-Caffeine Shortness Of Breath, Swelling and Other (See Comments)    Swelling in throat  . Ferrlecit [Na Ferric Gluc Cplx In Sucrose] Shortness Of Breath, Swelling and Other (See Comments)    Swelling in throat, tolerates Venofor  . Minoxidil Shortness Of Breath  . Tylenol [Acetaminophen] Anaphylaxis and Swelling  . Darvocet [Propoxyphene N-Acetaminophen] Hives    Consultations:  Nephrology   Procedures/Studies: DG Chest 2 View  Result Date: 09/01/2019 CLINICAL DATA:  Chest pain, history of DVT EXAM: CHEST - 2 VIEW COMPARISON:  08/16/2019 FINDINGS: Frontal and lateral views of the chest demonstrate stable enlargement the cardiac silhouette. Chronic right basilar consolidation and right pleural effusion unchanged. There is chronic diffuse interstitial prominence unchanged. No pneumothorax. No acute bony abnormalities. IMPRESSION: 1. Stable interstitial edema. 2. Persistent right basilar consolidation and loculated right pleural effusion. Electronically Signed   By: Randa Ngo M.D.   On: 09/01/2019 22:53   DG HIP UNILAT WITH PELVIS 2-3 VIEWS LEFT  Result Date: 09/21/2019 CLINICAL DATA:  55 year old male with left hip pain. EXAM: DG HIP (WITH OR WITHOUT PELVIS) 2-3V LEFT COMPARISON:  CT of the abdomen pelvis dated 07/08/2019. FINDINGS: There is no acute fracture or dislocation. Mild arthritic changes of the hips bilaterally. Calcification in the left hemipelvis corresponds to the renal transplant. The soft tissues are unremarkable. IMPRESSION: No acute fracture or dislocation. Electronically Signed   By: Milas Hock  Radparvar M.D.   On: 09/21/2019 16:28      Discharge Exam: Vitals:   09/22/19 1030 09/22/19 1055  BP: 138/64 (!) 150/92  Pulse: 64 70  Resp:  19  Temp:  97.8 F (36.6 C)  SpO2:  93%   Vitals:   09/22/19 0930 09/22/19 1000 09/22/19 1030 09/22/19 1055  BP: 116/77 (!) 133/51 138/64 (!) 150/92  Pulse: 63 61 64 70  Resp:    19  Temp:    97.8 F (36.6 C)  TempSrc:    Oral  SpO2:    93%  Weight:    78.6 kg    General: Pt is alert, awake, not in acute distress Cardiovascular: RRR, S1/S2 +, no rubs, no gallops Respiratory: CTA bilaterally, no wheezing, no rhonchi Abdominal: Soft, NT, ND, bowel sounds + Extremities: no edema, no cyanosis    The results of significant diagnostics from this hospitalization (including imaging, microbiology, ancillary  and laboratory) are listed below for reference.     Microbiology: Recent Results (from the past 240 hour(s))  SARS CORONAVIRUS 2 (TAT 6-24 HRS) Nasopharyngeal Nasopharyngeal Swab     Status: None   Collection Time: 09/21/19  1:21 PM   Specimen: Nasopharyngeal Swab  Result Value Ref Range Status   SARS Coronavirus 2 NEGATIVE NEGATIVE Final    Comment: (NOTE) SARS-CoV-2 target nucleic acids are NOT DETECTED. The SARS-CoV-2 RNA is generally detectable in upper and lower respiratory specimens during the acute phase of infection. Negative results do not preclude SARS-CoV-2 infection, do not rule out co-infections with other pathogens, and should not be used as the sole basis for treatment or other patient management decisions. Negative results must be combined with clinical observations, patient history, and epidemiological information. The expected result is Negative. Fact Sheet for Patients: SugarRoll.be Fact Sheet for Healthcare Providers: https://www.woods-mathews.com/ This test is not yet approved or cleared by the [place] FDA and  has been authorized for detection and/or diagnosis of SARS-CoV-2 by FDA under an Emergency Use Authorization (EUA). This EUA will remain  in effect (meaning this test can be used) for the duration of the COVID-19 declaration under Section 56 4(b)(1) of the Act, 21 U.S.C. section 360bbb-3(b)(1), unless the authorization is terminated or revoked sooner. Performed at [facility], [place] [address] [place], [place] [address]   Respiratory Panel by PCR     Status: None   Collection Time: 09/21/19  6:04 PM   Specimen: Nasopharyngeal Swab; Respiratory  Result Value Ref Range Status   Adenovirus NOT DETECTED NOT DETECTED Final   Coronavirus 229E NOT DETECTED NOT DETECTED Final    Comment: (NOTE) The Coronavirus on the Respiratory Panel, DOES NOT test for the novel  Coronavirus (2019 nCoV)    Coronavirus  HKU1 NOT DETECTED NOT DETECTED Final   Coronavirus NL63 NOT DETECTED NOT DETECTED Final   Coronavirus OC43 NOT DETECTED NOT DETECTED Final   Metapneumovirus NOT DETECTED NOT DETECTED Final   Rhinovirus / Enterovirus NOT DETECTED NOT DETECTED Final   Influenza A NOT DETECTED NOT DETECTED Final   Influenza B NOT DETECTED NOT DETECTED Final   Parainfluenza Virus 1 NOT DETECTED NOT DETECTED Final   Parainfluenza Virus 2 NOT DETECTED NOT DETECTED Final   Parainfluenza Virus 3 NOT DETECTED NOT DETECTED Final   Parainfluenza Virus 4 NOT DETECTED NOT DETECTED Final   Respiratory Syncytial Virus NOT DETECTED NOT DETECTED Final   Bordetella pertussis NOT DETECTED NOT DETECTED Final   Chlamydophila pneumoniae NOT DETECTED NOT DETECTED Final  Mycoplasma pneumoniae NOT DETECTED NOT DETECTED Final    Comment: Performed at [facility], [place] [address] [place], [place] [address]     Labs: BNP (last 3 results) No results for input(s): BNP in the last 8760 hours. Basic Metabolic Panel: Recent Labs  Lab 09/21/19 1316 09/22/19 0705  NA 136 136  K 5.8* 6.0*  CL 93* 93*  CO2 23 23  GLUCOSE 75 73  BUN 77* 85*  CREATININE 14.26* 15.61*  CALCIUM 7.8* 8.0*  PHOS  --  8.1*   Liver Function Tests: Recent Labs  Lab 09/22/19 0705  ALBUMIN 3.1*   No results for input(s): LIPASE, AMYLASE in the last 168 hours. No results for input(s): AMMONIA in the last 168 hours. CBC: Recent Labs  Lab 09/21/19 1316 09/22/19 0705  WBC 3.9* 3.5*  NEUTROABS 2.8  --   HGB 9.0* 8.1*  HCT 28.6* 25.0*  MCV 87.7 86.8  PLT 130* 103*   Cardiac Enzymes: No results for input(s): CKTOTAL, CKMB, CKMBINDEX, TROPONINI in the last 168 hours. BNP: Invalid input(s): POCBNP CBG: No results for input(s): GLUCAP in the last 168 hours. D-Dimer No results for input(s): DDIMER in the last 72 hours. Hgb A1c No results for input(s): HGBA1C in the last 72 hours. Lipid Profile No results for input(s): CHOL, HDL,  LDLCALC, TRIG, CHOLHDL, LDLDIRECT in the last 72 hours. Thyroid function studies No results for input(s): TSH, T4TOTAL, T3FREE, THYROIDAB in the last 72 hours.  Invalid input(s): FREET3 Anemia work up No results for input(s): VITAMINB12, FOLATE, FERRITIN, TIBC, IRON, RETICCTPCT in the last 72 hours. Urinalysis    Component Value Date/Time   COLORURINE YELLOW 10/18/2013 [place] 10/18/2013 0419   LABSPEC 1.008 10/18/2013 0419   PHURINE 8.5 (H) 10/18/2013 0419   GLUCOSEU 100 (A) 10/18/2013 0419   HGBUR TRACE (A) 10/18/2013 0419   BILIRUBINUR NEGATIVE 10/18/2013 0419   KETONESUR NEGATIVE 10/18/2013 0419   PROTEINUR 100 (A) 10/18/2013 0419   UROBILINOGEN 0.2 10/18/2013 0419   NITRITE NEGATIVE 10/18/2013 0419   LEUKOCYTESUR NEGATIVE 10/18/2013 0419   Sepsis Labs Invalid input(s): PROCALCITONIN,  WBC,  LACTICIDVEN Microbiology Recent Results (from the past 240 hour(s))  SARS CORONAVIRUS 2 (TAT 6-24 HRS) Nasopharyngeal Nasopharyngeal Swab     Status: None   Collection Time: 09/21/19  1:21 PM   Specimen: Nasopharyngeal Swab  Result Value Ref Range Status   SARS Coronavirus 2 NEGATIVE NEGATIVE Final    Comment: (NOTE) SARS-CoV-2 target nucleic acids are NOT DETECTED. The SARS-CoV-2 RNA is generally detectable in upper and lower respiratory specimens during the acute phase of infection. Negative results do not preclude SARS-CoV-2 infection, do not rule out co-infections with other pathogens, and should not be used as the sole basis for treatment or other patient management decisions. Negative results must be combined with clinical observations, patient history, and epidemiological information. The expected result is Negative. Fact Sheet for Patients: SugarRoll.be Fact Sheet for Healthcare Providers: https://www.woods-mathews.com/ This test is not yet approved or cleared by the [place] FDA and  has been authorized for  detection and/or diagnosis of SARS-CoV-2 by FDA under an Emergency Use Authorization (EUA). This EUA will remain  in effect (meaning this test can be used) for the duration of the COVID-19 declaration under Section 56 4(b)(1) of the Act, 21 U.S.C. section 360bbb-3(b)(1), unless the authorization is terminated or revoked sooner. Performed at [facility], [place] [address] [place], [place] [address]   Respiratory Panel by  PCR     Status: None   Collection Time: 09/21/19  6:04 PM   Specimen: Nasopharyngeal Swab; Respiratory  Result Value Ref Range Status   Adenovirus NOT DETECTED NOT DETECTED Final   Coronavirus 229E NOT DETECTED NOT DETECTED Final    Comment: (NOTE) The Coronavirus on the Respiratory Panel, DOES NOT test for the novel  Coronavirus (2019 nCoV)    Coronavirus HKU1 NOT DETECTED NOT DETECTED Final   Coronavirus NL63 NOT DETECTED NOT DETECTED Final   Coronavirus OC43 NOT DETECTED NOT DETECTED Final   Metapneumovirus NOT DETECTED NOT DETECTED Final   Rhinovirus / Enterovirus NOT DETECTED NOT DETECTED Final   Influenza A NOT DETECTED NOT DETECTED Final   Influenza B NOT DETECTED NOT DETECTED Final   Parainfluenza Virus 1 NOT DETECTED NOT DETECTED Final   Parainfluenza Virus 2 NOT DETECTED NOT DETECTED Final   Parainfluenza Virus 3 NOT DETECTED NOT DETECTED Final   Parainfluenza Virus 4 NOT DETECTED NOT DETECTED Final   Respiratory Syncytial Virus NOT DETECTED NOT DETECTED Final   Bordetella pertussis NOT DETECTED NOT DETECTED Final   Chlamydophila pneumoniae NOT DETECTED NOT DETECTED Final   Mycoplasma pneumoniae NOT DETECTED NOT DETECTED Final    Comment: Performed at [facility], [place]. [address] [place], [place] [address]     Time coordinating discharge: Over 30 minutes  SIGNED:   Darliss Cheney, MD  [facility] 09/22/2019, 2:08 PM  If 7PM-7AM, please contact night-coverage [email or web address]

## 2019-09-23 ENCOUNTER — Telehealth: Payer: Self-pay | Admitting: Nephrology

## 2019-09-23 IMAGING — DX DG ABDOMEN 1V
1 series · 1 of 1 positions shown · non-contrast
Comparison: 05/01/2017 CT abdomen and pelvis.

CLINICAL DATA: 51 y/o  M; upper and mid abdominal pain.

EXAM:
ABDOMEN - 1 VIEW

[t abdomen supine]
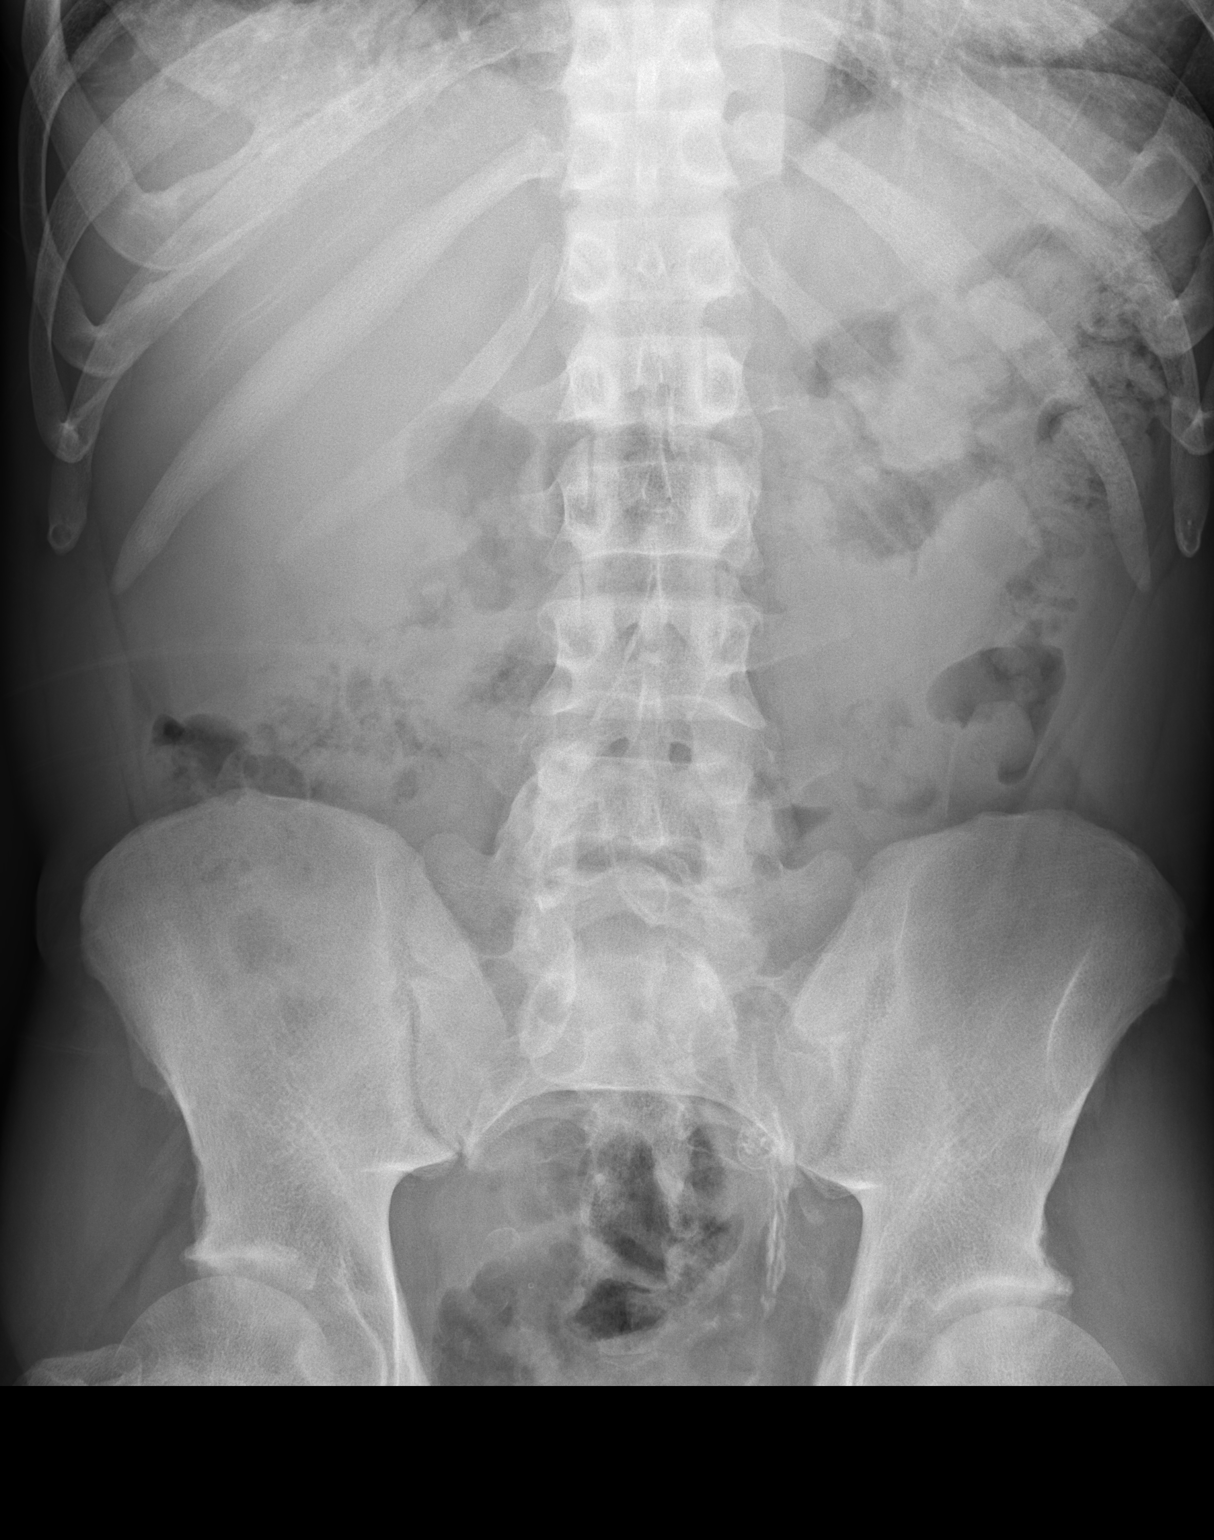

[1 of 1 positions shown; findings below may reference images not displayed]

FINDINGS: The bowel gas pattern is normal. No radio-opaque calculi or other
significant radiographic abnormality are seen. Vascular
calcifications in left hemipelvis related to since renal transplant.
IMPRESSION: Normal bowel gas pattern.

By: Nonkane Kouada M.D.

## 2019-09-23 NOTE — Telephone Encounter (Signed)
Transition of care contact from inpatient facility  Date of Discharge: 09/22/2019 Date of Contact: 09/23/2019 Method of contact: phone - unable to reach patient - see below  Attempted patient contact to discuss transition of care from recent inpatient hospitalization. Pateint was admitted to [facility] from : 3/2-09/22/2019 with the diagnosis of  Acute pulmonary edema/hyperkalemia. The [phone] number in Epic was said to have been changed or is no longer available.  The [phone] number said the wireless customer ws not available and there was no way to leave a message.  He did not come to his scheduled dialysis appointment today.  Amalia Hailey, PA-C [facility] Pager:  [phone]

## 2019-09-25 ENCOUNTER — Telehealth: Payer: Self-pay | Admitting: Physician Assistant

## 2019-09-25 NOTE — Telephone Encounter (Signed)
Transition of care from inpatient facility  Date of discharge: 09/22/19  Date of contact: 09/25/19 Method of contact: Phone  Attempted to contact patient to discuss transition of care from inpatient facility. Patient did not answer the phone and unable to leave a message on either number provided. Patient did not attend his scheduled dialysis appointment yesterday. Will continue to attempt to contact patient by phone or at HD unit.  Anice Paganini, PA-C 09/25/2019, 3:06 PM  [facility] Pager: [phone]

## 2019-10-02 ENCOUNTER — Encounter (HOSPITAL_COMMUNITY): Payer: Self-pay | Admitting: Emergency Medicine

## 2019-10-02 ENCOUNTER — Emergency Department (HOSPITAL_COMMUNITY)
Admission: EM | Admit: 2019-10-02 | Discharge: 2019-10-02 | Disposition: A | Discharge status: Home / Self Care | Payer: Medicare Other | Attending: Emergency Medicine | Admitting: Emergency Medicine

## 2019-10-02 ENCOUNTER — Encounter (HOSPITAL_COMMUNITY): Payer: Self-pay

## 2019-10-02 ENCOUNTER — Other Ambulatory Visit: Payer: Self-pay

## 2019-10-02 ENCOUNTER — Emergency Department (HOSPITAL_COMMUNITY)
Admission: EM | Admit: 2019-10-02 | Discharge: 2019-10-02 | Disposition: A | Discharge status: Home / Self Care | Payer: Medicare Other | Source: Home / Self Care | Attending: Emergency Medicine | Admitting: Emergency Medicine

## 2019-10-02 DIAGNOSIS — Z79899 Other long term (current) drug therapy: Secondary | ICD-10-CM | POA: Diagnosis not present

## 2019-10-02 DIAGNOSIS — Z87891 Personal history of nicotine dependence: Secondary | ICD-10-CM | POA: Insufficient documentation

## 2019-10-02 DIAGNOSIS — I10 Essential (primary) hypertension: Secondary | ICD-10-CM

## 2019-10-02 DIAGNOSIS — R109 Unspecified abdominal pain: Secondary | ICD-10-CM

## 2019-10-02 DIAGNOSIS — Z7984 Long term (current) use of oral hypoglycemic drugs: Secondary | ICD-10-CM | POA: Insufficient documentation

## 2019-10-02 DIAGNOSIS — R1084 Generalized abdominal pain: Secondary | ICD-10-CM | POA: Insufficient documentation

## 2019-10-02 DIAGNOSIS — I132 Hypertensive heart and chronic kidney disease with heart failure and with stage 5 chronic kidney disease, or end stage renal disease: Secondary | ICD-10-CM | POA: Diagnosis not present

## 2019-10-02 DIAGNOSIS — N186 End stage renal disease: Secondary | ICD-10-CM | POA: Diagnosis not present

## 2019-10-02 DIAGNOSIS — Z992 Dependence on renal dialysis: Secondary | ICD-10-CM | POA: Diagnosis not present

## 2019-10-02 DIAGNOSIS — G8929 Other chronic pain: Secondary | ICD-10-CM | POA: Diagnosis not present

## 2019-10-02 DIAGNOSIS — E1122 Type 2 diabetes mellitus with diabetic chronic kidney disease: Secondary | ICD-10-CM | POA: Insufficient documentation

## 2019-10-02 DIAGNOSIS — I5042 Chronic combined systolic (congestive) and diastolic (congestive) heart failure: Secondary | ICD-10-CM | POA: Insufficient documentation

## 2019-10-02 LAB — COMPREHENSIVE METABOLIC PANEL
ALT: 11 U/L (ref 0–44)
AST: 10 U/L — ABNORMAL LOW (ref 15–41)
Albumin: 3.5 g/dL (ref 3.5–5.0)
Alkaline Phosphatase: 223 U/L — ABNORMAL HIGH (ref 38–126)
Anion gap: 21 — ABNORMAL HIGH (ref 5–15)
BUN: 87 mg/dL — ABNORMAL HIGH (ref 6–20)
CO2: 21 mmol/L — ABNORMAL LOW (ref 22–32)
Calcium: 8.4 mg/dL — ABNORMAL LOW (ref 8.9–10.3)
Chloride: 100 mmol/L (ref 98–111)
Creatinine, Ser: 16.87 mg/dL — ABNORMAL HIGH (ref 0.61–1.24)
GFR calc Af Amer: 3 mL/min — ABNORMAL LOW (ref >60)
GFR calc non Af Amer: 3 mL/min — ABNORMAL LOW (ref >60)
Glucose, Bld: 74 mg/dL (ref 70–99)
Potassium: 4.9 mmol/L (ref 3.5–5.1)
Sodium: 142 mmol/L (ref 135–145)
Total Bilirubin: 0.7 mg/dL (ref 0.3–1.2)
Total Protein: 8 g/dL (ref 6.5–8.1)

## 2019-10-02 LAB — POCT I-STAT 7, (LYTES, BLD GAS, ICA,H+H)
Acid-base deficit: 1 mmol/L (ref 0.0–2.0)
Bicarbonate: 24.2 mmol/L (ref 20.0–28.0)
Calcium, Ion: 1.01 mmol/L — ABNORMAL LOW (ref 1.15–1.40)
HCT: 27 % — ABNORMAL LOW (ref 39.0–52.0)
Hemoglobin: 9.2 g/dL — ABNORMAL LOW (ref 13.0–17.0)
O2 Saturation: 99 %
Patient temperature: 97.5
Potassium: 4.7 mmol/L (ref 3.5–5.1)
Sodium: 139 mmol/L (ref 135–145)
TCO2: 25 mmol/L (ref 22–32)
pCO2 arterial: 39.8 mmHg (ref 32.0–48.0)
pH, Arterial: 7.39 (ref 7.350–7.450)
pO2, Arterial: 134 mmHg — ABNORMAL HIGH (ref 83.0–108.0)

## 2019-10-02 LAB — CBC
HCT: 28.9 % — ABNORMAL LOW (ref 39.0–52.0)
Hemoglobin: 9.2 g/dL — ABNORMAL LOW (ref 13.0–17.0)
MCH: 28 pg (ref 26.0–34.0)
MCHC: 31.8 g/dL (ref 30.0–36.0)
MCV: 87.8 fL (ref 80.0–100.0)
Platelets: 135 10*3/uL — ABNORMAL LOW (ref 150–400)
RBC: 3.29 MIL/uL — ABNORMAL LOW (ref 4.22–5.81)
RDW: 15.7 % — ABNORMAL HIGH (ref 11.5–15.5)
WBC: 3.8 10*3/uL — ABNORMAL LOW (ref 4.0–10.5)
nRBC: 0 % (ref 0.0–0.2)

## 2019-10-02 LAB — LIPASE, BLOOD: Lipase: 99 U/L — ABNORMAL HIGH (ref 11–51)

## 2019-10-02 MED ORDER — ALUM & MAG HYDROXIDE-SIMETH 200-200-20 MG/5ML PO SUSP
30.0000 mL | Freq: Once | ORAL | Status: DC
  Filled 2019-10-02: qty 30

## 2019-10-02 MED ORDER — CARVEDILOL 12.5 MG PO TABS: 25.0000 mg | ORAL_TABLET | Freq: Two times a day (BID) | ORAL | Status: DC

## 2019-10-02 MED ORDER — SODIUM CHLORIDE 0.9% FLUSH: 3.0000 mL | Freq: Once | INTRAVENOUS | Status: DC

## 2019-10-02 MED ORDER — LIDOCAINE VISCOUS HCL 2 % MT SOLN
15.0000 mL | Freq: Once | OROMUCOSAL | Status: AC
  Administered 2019-10-02: 05:00:00 15 mL via ORAL
  Filled 2019-10-02: qty 15

## 2019-10-02 MED ORDER — DICYCLOMINE HCL 10 MG PO CAPS
10.0000 mg | ORAL_CAPSULE | Freq: Once | ORAL | Status: AC
  Administered 2019-10-02: 10 mg via ORAL
  Filled 2019-10-02: qty 1

## 2019-10-02 MED ORDER — FAMOTIDINE 20 MG PO TABS
20.0000 mg | ORAL_TABLET | Freq: Once | ORAL | Status: AC
  Administered 2019-10-02: 05:00:00 20 mg via ORAL
  Filled 2019-10-02: qty 1

## 2019-10-02 MED ORDER — SODIUM CHLORIDE 0.9 % IV SOLN
50.00 | INTRAVENOUS | Status: DC
Start: ? — End: 2019-10-02

## 2019-10-02 MED ORDER — KETOROLAC TROMETHAMINE 60 MG/2ML IM SOLN
60.0000 mg | Freq: Once | INTRAMUSCULAR | Status: AC
  Administered 2019-10-02: 05:00:00 60 mg via INTRAMUSCULAR
  Filled 2019-10-02: qty 2

## 2019-10-02 MED ORDER — AMLODIPINE BESYLATE 5 MG PO TABS
5.0000 mg | ORAL_TABLET | Freq: Once | ORAL | Status: AC
  Administered 2019-10-02: 13:00:00 5 mg via ORAL
  Filled 2019-10-02: qty 1

## 2019-10-02 MED ORDER — HYDRALAZINE HCL 25 MG PO TABS
100.0000 mg | ORAL_TABLET | Freq: Once | ORAL | Status: AC
  Administered 2019-10-02: 100 mg via ORAL
  Filled 2019-10-02: qty 4
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
## 2019-10-02 NOTE — ED Triage Notes (Signed)
Pt seen in ED this morning and given taxi to dialysis.  Returns via [facility] for hypertension.  States he had 2 hrs and 40 min of dialysis.    Pt states he feels fine and just needs something to eat and then he is leaving.  States I don't need to be here because I was just here.  Per EMS, dialysis unable to get L arm fistula to stop bleeding but EMS states it is controlled now.  Pt began yelling help in triage room and states he needs a cracker or something to eat because dialysis didn't have any food.  Pt to lobby and given cheese and crackers per request.

## 2019-10-02 NOTE — Discharge Instructions (Addendum)
Go to dialysis immediately.

## 2019-10-02 NOTE — ED Notes (Signed)
Pt refusing the use of his right arm for blood pressure because he believes he will get a blood clot that will go to his heart.

## 2019-10-02 NOTE — ED Notes (Addendum)
Pt refused his discharge paperwork. Pt walked out of room after being discharged, said "I dont need discharge paperwork, go ahead and tear it up." pt asked how to leave and walked out.

## 2019-10-02 NOTE — Discharge Instructions (Addendum)
Continue taking your blood pressure medicine.  Follow-up as needed

## 2019-10-02 NOTE — ED Triage Notes (Signed)
Pt arrives to ED w/ c/o  Abdominal x 2 days, pain 9/10.

## 2019-10-03 ENCOUNTER — Emergency Department (HOSPITAL_COMMUNITY): Payer: Medicare Other

## 2019-10-03 ENCOUNTER — Other Ambulatory Visit: Payer: Self-pay

## 2019-10-03 ENCOUNTER — Encounter (HOSPITAL_COMMUNITY): Payer: Self-pay | Admitting: Emergency Medicine

## 2019-10-03 ENCOUNTER — Emergency Department (HOSPITAL_COMMUNITY)
Admission: EM | Admit: 2019-10-03 | Discharge: 2019-10-03 | Disposition: A | Discharge status: Home / Self Care | Payer: Medicare Other | Attending: Emergency Medicine | Admitting: Emergency Medicine

## 2019-10-03 DIAGNOSIS — Z992 Dependence on renal dialysis: Secondary | ICD-10-CM | POA: Diagnosis not present

## 2019-10-03 DIAGNOSIS — N186 End stage renal disease: Secondary | ICD-10-CM | POA: Insufficient documentation

## 2019-10-03 DIAGNOSIS — Z79899 Other long term (current) drug therapy: Secondary | ICD-10-CM | POA: Diagnosis not present

## 2019-10-03 DIAGNOSIS — Z87891 Personal history of nicotine dependence: Secondary | ICD-10-CM | POA: Diagnosis not present

## 2019-10-03 DIAGNOSIS — E1122 Type 2 diabetes mellitus with diabetic chronic kidney disease: Secondary | ICD-10-CM | POA: Diagnosis not present

## 2019-10-03 DIAGNOSIS — I5022 Chronic systolic (congestive) heart failure: Secondary | ICD-10-CM | POA: Diagnosis not present

## 2019-10-03 DIAGNOSIS — R109 Unspecified abdominal pain: Secondary | ICD-10-CM | POA: Diagnosis not present

## 2019-10-03 DIAGNOSIS — I10 Essential (primary) hypertension: Secondary | ICD-10-CM

## 2019-10-03 DIAGNOSIS — I132 Hypertensive heart and chronic kidney disease with heart failure and with stage 5 chronic kidney disease, or end stage renal disease: Secondary | ICD-10-CM | POA: Diagnosis not present

## 2019-10-03 LAB — COMPREHENSIVE METABOLIC PANEL
ALT: 13 U/L (ref 0–44)
AST: 23 U/L (ref 15–41)
Albumin: 3.3 g/dL — ABNORMAL LOW (ref 3.5–5.0)
Alkaline Phosphatase: 204 U/L — ABNORMAL HIGH (ref 38–126)
Anion gap: 20 — ABNORMAL HIGH (ref 5–15)
BUN: 77 mg/dL — ABNORMAL HIGH (ref 6–20)
CO2: 24 mmol/L (ref 22–32)
Calcium: 8.3 mg/dL — ABNORMAL LOW (ref 8.9–10.3)
Chloride: 96 mmol/L — ABNORMAL LOW (ref 98–111)
Creatinine, Ser: 13.9 mg/dL — ABNORMAL HIGH (ref 0.61–1.24)
GFR calc Af Amer: 4 mL/min — ABNORMAL LOW (ref >60)
GFR calc non Af Amer: 4 mL/min — ABNORMAL LOW (ref >60)
Glucose, Bld: 90 mg/dL (ref 70–99)
Potassium: 5.3 mmol/L — ABNORMAL HIGH (ref 3.5–5.1)
Sodium: 140 mmol/L (ref 135–145)
Total Bilirubin: 1.1 mg/dL (ref 0.3–1.2)
Total Protein: 7.7 g/dL (ref 6.5–8.1)

## 2019-10-03 LAB — CBC WITH DIFFERENTIAL/PLATELET
Abs Immature Granulocytes: 0.02 10*3/uL (ref 0.00–0.07)
Basophils Absolute: 0 10*3/uL (ref 0.0–0.1)
Basophils Relative: 1 %
Eosinophils Absolute: 0.3 10*3/uL (ref 0.0–0.5)
Eosinophils Relative: 7 %
HCT: 26 % — ABNORMAL LOW (ref 39.0–52.0)
Hemoglobin: 8.2 g/dL — ABNORMAL LOW (ref 13.0–17.0)
Immature Granulocytes: 1 %
Lymphocytes Relative: 9 %
Lymphs Abs: 0.4 10*3/uL — ABNORMAL LOW (ref 0.7–4.0)
MCH: 28 pg (ref 26.0–34.0)
MCHC: 31.5 g/dL (ref 30.0–36.0)
MCV: 88.7 fL (ref 80.0–100.0)
Monocytes Absolute: 0.4 10*3/uL (ref 0.1–1.0)
Monocytes Relative: 9 %
Neutro Abs: 2.9 10*3/uL (ref 1.7–7.7)
Neutrophils Relative %: 73 %
Platelets: 124 10*3/uL — ABNORMAL LOW (ref 150–400)
RBC: 2.93 MIL/uL — ABNORMAL LOW (ref 4.22–5.81)
RDW: 15.7 % — ABNORMAL HIGH (ref 11.5–15.5)
WBC: 4 10*3/uL (ref 4.0–10.5)
nRBC: 0 % (ref 0.0–0.2)

## 2019-10-03 LAB — CBG MONITORING, ED: Glucose-Capillary: 78 mg/dL (ref 70–99)

## 2019-10-03 LAB — LIPASE, BLOOD: Lipase: 35 U/L (ref 11–51)

## 2019-10-03 MED ORDER — SODIUM ZIRCONIUM CYCLOSILICATE 5 G PO PACK
5.0000 g | PACK | Freq: Once | ORAL | Status: AC
  Administered 2019-10-03: 5 g via ORAL
  Filled 2019-10-03: qty 1

## 2019-10-03 MED ORDER — ONDANSETRON HCL 4 MG/2ML IJ SOLN
4.0000 mg | Freq: Once | INTRAMUSCULAR | Status: DC
  Filled 2019-10-03: qty 2

## 2019-10-03 MED ORDER — HYDRALAZINE HCL 20 MG/ML IJ SOLN
5.0000 mg | Freq: Once | INTRAMUSCULAR | Status: AC
  Administered 2019-10-03: 5 mg via INTRAVENOUS
  Filled 2019-10-03: qty 1

## 2019-10-03 MED ORDER — PANTOPRAZOLE SODIUM 40 MG IV SOLR
40.0000 mg | Freq: Once | INTRAVENOUS | Status: AC
  Administered 2019-10-03: 40 mg via INTRAVENOUS
  Filled 2019-10-03: qty 40

## 2019-10-03 MED ORDER — MORPHINE SULFATE (PF) 4 MG/ML IV SOLN
4.0000 mg | Freq: Once | INTRAVENOUS | Status: AC
  Administered 2019-10-03: 4 mg via INTRAVENOUS
  Filled 2019-10-03: qty 1

## 2019-10-03 MED ORDER — ONDANSETRON HCL 4 MG/2ML IJ SOLN
4.0000 mg | Freq: Once | INTRAMUSCULAR | Status: AC
  Administered 2019-10-03: 4 mg via INTRAMUSCULAR

## 2019-10-03 MED ORDER — MORPHINE SULFATE (PF) 4 MG/ML IV SOLN
4.0000 mg | Freq: Once | INTRAVENOUS | Status: DC
  Filled 2019-10-03: qty 1

## 2019-10-03 MED ORDER — MORPHINE SULFATE (PF) 4 MG/ML IV SOLN
4.0000 mg | Freq: Once | INTRAVENOUS | Status: AC
  Administered 2019-10-03: 4 mg via INTRAMUSCULAR

## 2019-10-03 MED ORDER — ONDANSETRON HCL 4 MG PO TABS: 4.0000 mg | ORAL_TABLET | ORAL | 0 refills | Status: AC | PRN

## 2019-10-03 NOTE — ED Notes (Addendum)
Pt returned from xray

## 2019-10-03 NOTE — ED Notes (Signed)
Rn attempted to get IV access and was unsuccessful.. IV team consult ordered.
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
## 2019-10-03 NOTE — ED Triage Notes (Addendum)
C/o generalized abd pain and SOB since yesterday.  States he thinks he is having a pancreatitis flare-up.  Pt requesting oxygen-pulse ox 100%.  Pt sent from dialysis yesterday for HTN.  Didn't complete full dialysis.

## 2019-10-03 NOTE — ED Notes (Signed)
Pt to x-ray

## 2019-10-03 NOTE — Discharge Instructions (Signed)
You were seen today for abdominal pain and shortness of breath. Your workup was reassuring. You do not need emergent dialysis right now but you need to go tomorrow as scheduled. Make sure to take al lyour medications as prescribed. Thank you for allowing me to care for you today. Please return to the emergency department if you have new or worsening symptoms. Take your medications as instructed.

## 2019-10-03 NOTE — ED Notes (Signed)
Patient given discharge instructions patient verbalizes understanding.

## 2019-10-05 ENCOUNTER — Other Ambulatory Visit: Payer: Self-pay

## 2019-10-05 ENCOUNTER — Emergency Department (HOSPITAL_COMMUNITY): Payer: Medicare Other

## 2019-10-05 ENCOUNTER — Encounter (HOSPITAL_COMMUNITY): Payer: Self-pay | Admitting: Emergency Medicine

## 2019-10-05 ENCOUNTER — Emergency Department (HOSPITAL_COMMUNITY)
Admission: EM | Admit: 2019-10-05 | Discharge: 2019-10-06 | Disposition: A | Discharge status: Home / Self Care | Payer: Medicare Other | Attending: Emergency Medicine | Admitting: Emergency Medicine

## 2019-10-05 DIAGNOSIS — Z86711 Personal history of pulmonary embolism: Secondary | ICD-10-CM | POA: Insufficient documentation

## 2019-10-05 DIAGNOSIS — R109 Unspecified abdominal pain: Secondary | ICD-10-CM

## 2019-10-05 DIAGNOSIS — D649 Anemia, unspecified: Secondary | ICD-10-CM | POA: Insufficient documentation

## 2019-10-05 DIAGNOSIS — Z86718 Personal history of other venous thrombosis and embolism: Secondary | ICD-10-CM | POA: Insufficient documentation

## 2019-10-05 DIAGNOSIS — Z992 Dependence on renal dialysis: Secondary | ICD-10-CM | POA: Diagnosis not present

## 2019-10-05 DIAGNOSIS — Z79899 Other long term (current) drug therapy: Secondary | ICD-10-CM | POA: Diagnosis not present

## 2019-10-05 DIAGNOSIS — Z20822 Contact with and (suspected) exposure to covid-19: Secondary | ICD-10-CM | POA: Diagnosis not present

## 2019-10-05 DIAGNOSIS — E1122 Type 2 diabetes mellitus with diabetic chronic kidney disease: Secondary | ICD-10-CM | POA: Diagnosis not present

## 2019-10-05 DIAGNOSIS — I5042 Chronic combined systolic (congestive) and diastolic (congestive) heart failure: Secondary | ICD-10-CM | POA: Diagnosis not present

## 2019-10-05 DIAGNOSIS — G8929 Other chronic pain: Secondary | ICD-10-CM | POA: Diagnosis not present

## 2019-10-05 DIAGNOSIS — Z87891 Personal history of nicotine dependence: Secondary | ICD-10-CM | POA: Insufficient documentation

## 2019-10-05 DIAGNOSIS — R1084 Generalized abdominal pain: Secondary | ICD-10-CM | POA: Diagnosis present

## 2019-10-05 DIAGNOSIS — E875 Hyperkalemia: Secondary | ICD-10-CM | POA: Insufficient documentation

## 2019-10-05 DIAGNOSIS — I132 Hypertensive heart and chronic kidney disease with heart failure and with stage 5 chronic kidney disease, or end stage renal disease: Secondary | ICD-10-CM | POA: Diagnosis not present

## 2019-10-05 DIAGNOSIS — N186 End stage renal disease: Secondary | ICD-10-CM | POA: Diagnosis not present

## 2019-10-05 LAB — COMPREHENSIVE METABOLIC PANEL
ALT: 11 U/L (ref 0–44)
AST: 12 U/L — ABNORMAL LOW (ref 15–41)
Albumin: 3.1 g/dL — ABNORMAL LOW (ref 3.5–5.0)
Alkaline Phosphatase: 206 U/L — ABNORMAL HIGH (ref 38–126)
Anion gap: 24 — ABNORMAL HIGH (ref 5–15)
BUN: 103 mg/dL — ABNORMAL HIGH (ref 6–20)
CO2: 19 mmol/L — ABNORMAL LOW (ref 22–32)
Calcium: 8.1 mg/dL — ABNORMAL LOW (ref 8.9–10.3)
Chloride: 97 mmol/L — ABNORMAL LOW (ref 98–111)
Creatinine, Ser: 17.43 mg/dL — ABNORMAL HIGH (ref 0.61–1.24)
GFR calc Af Amer: 3 mL/min — ABNORMAL LOW (ref >60)
GFR calc non Af Amer: 3 mL/min — ABNORMAL LOW (ref >60)
Glucose, Bld: 79 mg/dL (ref 70–99)
Potassium: 6.5 mmol/L (ref 3.5–5.1)
Sodium: 140 mmol/L (ref 135–145)
Total Bilirubin: 0.8 mg/dL (ref 0.3–1.2)
Total Protein: 7.7 g/dL (ref 6.5–8.1)

## 2019-10-05 LAB — CBC
HCT: 24.5 % — ABNORMAL LOW (ref 39.0–52.0)
Hemoglobin: 7.7 g/dL — ABNORMAL LOW (ref 13.0–17.0)
MCH: 27.4 pg (ref 26.0–34.0)
MCHC: 31.4 g/dL (ref 30.0–36.0)
MCV: 87.2 fL (ref 80.0–100.0)
Platelets: 122 10*3/uL — ABNORMAL LOW (ref 150–400)
RBC: 2.81 MIL/uL — ABNORMAL LOW (ref 4.22–5.81)
RDW: 15.7 % — ABNORMAL HIGH (ref 11.5–15.5)
WBC: 3.6 10*3/uL — ABNORMAL LOW (ref 4.0–10.5)
nRBC: 0 % (ref 0.0–0.2)

## 2019-10-05 LAB — CBG MONITORING, ED
Glucose-Capillary: 63 mg/dL — ABNORMAL LOW (ref 70–99)
Glucose-Capillary: 80 mg/dL (ref 70–99)

## 2019-10-05 LAB — RESPIRATORY PANEL BY RT PCR (FLU A&B, COVID)
Influenza A by PCR: NEGATIVE
Influenza B by PCR: NEGATIVE
SARS Coronavirus 2 by RT PCR: NEGATIVE

## 2019-10-05 LAB — LIPASE, BLOOD: Lipase: 38 U/L (ref 11–51)

## 2019-10-05 MED ORDER — PENTAFLUOROPROP-TETRAFLUOROETH EX AERO
1.0000 "application " | INHALATION_SPRAY | CUTANEOUS | Status: DC | PRN
  Filled 2019-10-05: qty 116

## 2019-10-05 MED ORDER — ALTEPLASE 2 MG IJ SOLR: 2.0000 mg | Freq: Once | INTRAMUSCULAR | Status: DC | PRN

## 2019-10-05 MED ORDER — CHLORHEXIDINE GLUCONATE CLOTH 2 % EX PADS: 6.0000 | MEDICATED_PAD | Freq: Every day | CUTANEOUS | Status: DC

## 2019-10-05 MED ORDER — SODIUM CHLORIDE 0.9 % IV SOLN: 100.0000 mL | INTRAVENOUS | Status: DC | PRN

## 2019-10-05 MED ORDER — DARBEPOETIN ALFA 200 MCG/0.4ML IJ SOSY
200.0000 ug | PREFILLED_SYRINGE | Freq: Once | INTRAMUSCULAR | Status: DC
  Filled 2019-10-05: qty 0.4

## 2019-10-05 MED ORDER — PROMETHAZINE HCL 25 MG/ML IJ SOLN
25.0000 mg | Freq: Once | INTRAMUSCULAR | Status: AC
  Administered 2019-10-05: 25 mg via INTRAVENOUS
  Filled 2019-10-05: qty 1

## 2019-10-05 MED ORDER — ONDANSETRON 4 MG PO TBDP
8.0000 mg | ORAL_TABLET | Freq: Once | ORAL | Status: AC
  Administered 2019-10-05: 8 mg via ORAL
  Filled 2019-10-05: qty 2

## 2019-10-05 MED ORDER — LIDOCAINE HCL (PF) 1 % IJ SOLN: 5.0000 mL | INTRAMUSCULAR | Status: DC | PRN

## 2019-10-05 MED ORDER — HEPARIN SODIUM (PORCINE) 1000 UNIT/ML DIALYSIS
1000.0000 [IU] | INTRAMUSCULAR | Status: DC | PRN
  Filled 2019-10-05: qty 1

## 2019-10-05 MED ORDER — MORPHINE SULFATE (PF) 10 MG/ML IV SOLN
10.0000 mg | Freq: Once | INTRAVENOUS | Status: AC
  Administered 2019-10-05: 10 mg via INTRAMUSCULAR
  Filled 2019-10-05: qty 1

## 2019-10-05 MED ORDER — SODIUM CHLORIDE 0.9 % IV BOLUS: 500.0000 mL | Freq: Once | INTRAVENOUS | Status: DC

## 2019-10-05 MED ORDER — LIDOCAINE-PRILOCAINE 2.5-2.5 % EX CREA
1.0000 "application " | TOPICAL_CREAM | CUTANEOUS | Status: DC | PRN
  Filled 2019-10-05: qty 5

## 2019-10-05 MED ORDER — SODIUM CHLORIDE 0.9 % IV SOLN
8.0000 mg | Freq: Once | INTRAVENOUS | Status: AC
  Administered 2019-10-05: 8 mg via INTRAVENOUS
  Filled 2019-10-05: qty 4

## 2019-10-05 MED ORDER — SODIUM THIOSULFATE 25 % IV SOLN
25.0000 g | Freq: Once | INTRAVENOUS | Status: AC
  Administered 2019-10-06: 25 g via INTRAVENOUS
  Filled 2019-10-05: qty 100

## 2019-10-05 MED ORDER — GELATIN ABSORBABLE 12-7 MM EX MISC
1.0000 | Freq: Once | CUTANEOUS | Status: AC
  Administered 2019-10-05: 1 via TOPICAL

## 2019-10-05 NOTE — ED Notes (Signed)
IV team unable to access a IV site

## 2019-10-05 NOTE — Progress Notes (Signed)
PT well known to [place].  ESRD at [facility] MWF using AVF.  Multiple ED visits, chronic abd pain.    Today with some bleeding from AVF, resolved.  K 6.5.  Has missed or had truncated treatments lately.   Pt nontoxic on exam, appears comfortable.  RRR, CTAB, LFA AVF +B/T w/o bleeding.    Plan for HD today for hyperkalemia, 3h treatment. D/w Dr. Jeanell Sparrow, plan for ED discharge afterwards, w/u otherwise has been negative (neg CT A/P for acute issues; LFTs and lipase stable) .

## 2019-10-05 NOTE — ED Notes (Signed)
PT reports he has had blood clots in both arms . Pt reports he can only have BP checked in his leg.

## 2019-10-05 NOTE — ED Notes (Signed)
Pt provided applejuice and is eating a croissant that he had from [facility] this morning and never ate.

## 2019-10-05 NOTE — ED Notes (Signed)
Dr Jeanell Sparrow ray instructed that [facility] tecs reports they can not get blood from Pt because Pt is difficult to stick. Iv team left this writer that Pt was abusive to nurse when the attempt was made to start IV.

## 2019-10-05 NOTE — ED Notes (Signed)
Fistula dry and intact

## 2019-10-05 NOTE — ED Notes (Signed)
DR reported an arterial could be done

## 2019-10-05 NOTE — ED Notes (Signed)
Pt called out for nurse to come to room . Pt reported he wanted his bed rail down Pt instructed for safety the rail needed to stay up.

## 2019-10-05 NOTE — ED Notes (Signed)
Pt reports he can only have BP taken on his leg

## 2019-10-05 NOTE — ED Notes (Signed)
PT's Dialysis graft started bleeding . Pressure to site

## 2019-10-05 NOTE — ED Triage Notes (Addendum)
Pt reports he is scheduled for dialysis at 12 today, went there early this morning to tell them he needed dialysis early because he was vomiting. States they sent him here for further eval. States he went to dialysis Saturday but only got 1.5 hours of his session.   Pt being rude to staff, demanding O2 in triage.

## 2019-10-05 NOTE — ED Notes (Signed)
Continue to hold pressure on graft sited for bleeding

## 2019-10-05 NOTE — ED Notes (Signed)
Pt refused blood work in triage.

## 2019-10-05 NOTE — ED Notes (Signed)
Pt plan of care to to go for dialysis then DC home

## 2019-10-05 NOTE — ED Notes (Signed)
Dialysis fistula dry and intact

## 2019-10-05 NOTE — ED Notes (Signed)
DR Ray informed unable to gert blood work or Iv start

## 2019-10-05 NOTE — ED Notes (Signed)
Pt called staff to room his Dialysis graft bleeding. Pt REFUSES to allow staff to apply a pressure to Dialysis graft .

## 2019-10-05 NOTE — ED Notes (Signed)
Dr ray informed blood work not drawn because Pt is a difficult stick

## 2019-10-05 NOTE — ED Notes (Signed)
PT A/O  . Pt reported he has ABD pain and has missed 2-3 dialysis treatment.

## 2019-10-05 NOTE — ED Notes (Signed)
Unable to give IV fluids because at this time staff unable to access IV.

## 2019-10-05 NOTE — ED Notes (Signed)
Pt refused dsy to arm and only allowed a band aide  To be placed on arm. Pt given heat packs because Pt reported cramping.

## 2019-10-05 NOTE — Progress Notes (Signed)
Unsuccessful with IV attempt, patient is very verbally abusive with his words to IV nurses. RN Luellen Pucker made aware that MD should be notified for access.
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
## 2019-10-05 NOTE — Progress Notes (Signed)
RN Luellen Pucker to notify IV team via updated consult if patient still needs PIV at this time MD would like to obtain CT scan

## 2019-10-05 NOTE — ED Notes (Addendum)
Pt stated the he does not want to be hooked up on cardiac monitor until he gets something nausea and I believe he said something for pain as well.  Pt is upset and keep calling out every two mins

## 2019-10-05 NOTE — ED Notes (Signed)
Pt called out demanding to have rail on stretcher because his arm was cramping. the patient. Pt pulled dsy off Fistula on Lt arm and refused to have new dsy put on . TC to dialysis  To ask how long the surgy foam on fistula. This Probation officer was informed Surgy foam can stay on till next  Dialysis day.

## 2019-10-05 NOTE — ED Notes (Signed)
DR Ray at bedside to get blood sample from a arterial  Stick . Resp. At bed side with DR Ray to help.

## 2019-10-06 ENCOUNTER — Emergency Department (HOSPITAL_COMMUNITY)
Admission: EM | Admit: 2019-10-06 | Discharge: 2019-10-06 | Disposition: A | Discharge status: Home / Self Care | Payer: Medicare Other | Source: Home / Self Care | Attending: Emergency Medicine | Admitting: Emergency Medicine

## 2019-10-06 ENCOUNTER — Encounter (HOSPITAL_COMMUNITY): Payer: Self-pay | Admitting: Emergency Medicine

## 2019-10-06 ENCOUNTER — Other Ambulatory Visit: Payer: Self-pay

## 2019-10-06 DIAGNOSIS — Z992 Dependence on renal dialysis: Secondary | ICD-10-CM

## 2019-10-06 DIAGNOSIS — I10 Essential (primary) hypertension: Secondary | ICD-10-CM

## 2019-10-06 DIAGNOSIS — N186 End stage renal disease: Secondary | ICD-10-CM

## 2019-10-06 DIAGNOSIS — D649 Anemia, unspecified: Secondary | ICD-10-CM

## 2019-10-06 LAB — CBC
HCT: 30.1 % — ABNORMAL LOW (ref 39.0–52.0)
Hemoglobin: 9.4 g/dL — ABNORMAL LOW (ref 13.0–17.0)
MCH: 28.1 pg (ref 26.0–34.0)
MCHC: 31.2 g/dL (ref 30.0–36.0)
MCV: 89.9 fL (ref 80.0–100.0)
Platelets: 139 10*3/uL — ABNORMAL LOW (ref 150–400)
RBC: 3.35 MIL/uL — ABNORMAL LOW (ref 4.22–5.81)
RDW: 15.8 % — ABNORMAL HIGH (ref 11.5–15.5)
WBC: 3.7 10*3/uL — ABNORMAL LOW (ref 4.0–10.5)
nRBC: 0 % (ref 0.0–0.2)

## 2019-10-06 LAB — CBG MONITORING, ED: Glucose-Capillary: 74 mg/dL (ref 70–99)

## 2019-10-06 MED ORDER — HYDRALAZINE HCL 25 MG PO TABS
100.0000 mg | ORAL_TABLET | Freq: Once | ORAL | Status: AC
  Administered 2019-10-06: 100 mg via ORAL
  Filled 2019-10-06: qty 4

## 2019-10-06 MED ORDER — AMLODIPINE BESYLATE 5 MG PO TABS
10.0000 mg | ORAL_TABLET | Freq: Once | ORAL | Status: AC
  Administered 2019-10-06: 10 mg via ORAL
  Filled 2019-10-06: qty 2

## 2019-10-06 NOTE — ED Notes (Signed)
Patient verbalizes understanding of discharge instructions and follow up care. Opportunity for questioning and answers were provided. All questions answered completely. Armband removed by staff, pt discharged from ED. Ambulatory from ED with strong, steady gait

## 2019-10-06 NOTE — ED Triage Notes (Signed)
Patient complaining of sob, abdominal pain, low blood sugar, trouble seeing, and vomiting. Patient states this started tonight. [facility] am today.

## 2019-10-06 NOTE — ED Notes (Signed)
Pt was discharged from the ED. Pt read and understood discharge paperwork. Pt had vital signs completed. Pt conscious, breathing, and A&Ox4. No distress noted. Pt speaking in complete sentences. Pt brought out of the ED via wheelchair. E-signature not available.

## 2019-10-06 NOTE — ED Notes (Signed)
meds given per [facility]. Name/DOB verified with pt
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
## 2019-10-06 NOTE — ED Triage Notes (Signed)
Pt here for recurrence of bleeding L arm fistula. No injury to site. Here for same yesterday. Arrives with duck tape wrapped around fistula and bleeding controlled but sts as soon as you remove pressure it "spurts."

## 2019-10-06 NOTE — ED Provider Notes (Addendum)
2:29 AM Patient returned from dialysis.  He has multiple complaints including ongoing pain.  He had a full work-up including a CT scan earlier that was reassuring.  Dialyzed without incident.  He reports "I lost a lot of blood earlier."  Left arm fistula with dressing in place.  No active bleeding.  His hemoglobin was close to his baseline on prior evaluation.  Vital signs reviewed and stable.  Blood pressure improved post dialysis.  I feel he is stable for discharge.   Merryl Hacker, MD 10/06/19 0230    Merryl Hacker, MD 10/06/19 [phone]

## 2019-10-06 NOTE — ED Notes (Signed)
Pt states he is going to [place] and will be back.

## 2019-10-06 NOTE — Discharge Instructions (Addendum)
Follow-up with your dialysis doctors regarding the ulceration noted on your left arm.
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
## 2019-10-08 MED ORDER — LORAZEPAM 0.5 MG PO TABS
0.50 | ORAL_TABLET | ORAL | Status: DC
Start: ? — End: 2019-10-08

## 2019-10-08 MED ORDER — GABAPENTIN 300 MG PO CAPS
300.00 | ORAL_CAPSULE | ORAL | Status: DC
Start: 2019-10-08 — End: 2019-10-08

## 2019-10-08 MED ORDER — SCOPOLAMINE 1 MG/3DAYS TD PT72
1.00 | MEDICATED_PATCH | TRANSDERMAL | Status: DC
Start: 2019-10-11 — End: 2019-10-08

## 2019-10-08 MED ORDER — ACETAMINOPHEN 500 MG PO TABS
500.00 | ORAL_TABLET | ORAL | Status: DC
Start: 2019-10-08 — End: 2019-10-08

## 2019-10-08 MED ORDER — NEPHRO-VITE 0.8 MG PO TABS
1.00 | ORAL_TABLET | ORAL | Status: DC
Start: 2019-10-09 — End: 2019-10-08

## 2019-10-08 MED ORDER — LIDOCAINE 5 % EX OINT
TOPICAL_OINTMENT | CUTANEOUS | Status: DC
Start: ? — End: 2019-10-08

## 2019-10-08 MED ORDER — HEPARIN SODIUM (PORCINE) 5000 UNIT/ML IJ SOLN
5000.00 | INTRAMUSCULAR | Status: DC
Start: 2019-10-08 — End: 2019-10-08

## 2019-10-08 MED ORDER — HYDROXYZINE HCL 25 MG PO TABS
25.00 | ORAL_TABLET | ORAL | Status: DC
Start: ? — End: 2019-10-08

## 2019-10-08 MED ORDER — HYDRALAZINE HCL 50 MG PO TABS
100.00 | ORAL_TABLET | ORAL | Status: DC
Start: 2019-10-08 — End: 2019-10-08

## 2019-10-08 MED ORDER — PANTOPRAZOLE SODIUM 40 MG PO TBEC
40.00 | DELAYED_RELEASE_TABLET | ORAL | Status: DC
Start: 2019-10-09 — End: 2019-10-08

## 2019-10-08 MED ORDER — CARVEDILOL 12.5 MG PO TABS
25.00 | ORAL_TABLET | ORAL | Status: DC
Start: 2019-10-08 — End: 2019-10-08

## 2019-10-08 MED ORDER — AMLODIPINE BESYLATE 5 MG PO TABS
5.00 | ORAL_TABLET | ORAL | Status: DC
Start: 2019-10-09 — End: 2019-10-08

## 2019-10-08 MED ORDER — NALOXONE HCL 0.4 MG/ML IJ SOLN
0.40 | INTRAMUSCULAR | Status: DC
Start: ? — End: 2019-10-08

## 2019-10-08 MED ORDER — OXYCODONE HCL 15 MG PO TABS
15.00 | ORAL_TABLET | ORAL | Status: DC
Start: ? — End: 2019-10-08

## 2019-10-08 MED ORDER — SEVELAMER CARBONATE 800 MG PO TABS
800.00 | ORAL_TABLET | ORAL | Status: DC
Start: 2019-10-08 — End: 2019-10-08

## 2019-10-26 MED ORDER — APIXABAN 2.5 MG PO TABS
2.50 | ORAL_TABLET | ORAL | Status: DC
Start: 2019-10-26 — End: 2019-10-26

## 2019-10-26 MED ORDER — PANTOPRAZOLE SODIUM 40 MG PO TBEC
40.00 | DELAYED_RELEASE_TABLET | ORAL | Status: DC
Start: 2019-10-27 — End: 2019-10-26

## 2019-10-26 MED ORDER — CARVEDILOL 12.5 MG PO TABS
25.00 | ORAL_TABLET | ORAL | Status: DC
Start: 2019-10-26 — End: 2019-10-26

## 2019-10-26 MED ORDER — CYCLOBENZAPRINE HCL 10 MG PO TABS
10.00 | ORAL_TABLET | ORAL | Status: DC
Start: ? — End: 2019-10-26

## 2019-10-26 MED ORDER — HYDRALAZINE HCL 50 MG PO TABS
100.00 | ORAL_TABLET | ORAL | Status: DC
Start: 2019-10-26 — End: 2019-10-26

## 2019-10-26 MED ORDER — DEXTROSE 10 % IV SOLN
125.00 | INTRAVENOUS | Status: DC
Start: ? — End: 2019-10-26

## 2019-10-26 MED ORDER — SCOPOLAMINE 1 MG/3DAYS TD PT72
1.00 | MEDICATED_PATCH | TRANSDERMAL | Status: DC
Start: 2019-10-28 — End: 2019-10-26

## 2019-10-26 MED ORDER — ONDANSETRON HCL 4 MG/2ML IJ SOLN
4.00 | INTRAMUSCULAR | Status: DC
Start: ? — End: 2019-10-26

## 2019-10-26 MED ORDER — OXYCODONE HCL 15 MG PO TABS
15.00 | ORAL_TABLET | ORAL | Status: DC
Start: ? — End: 2019-10-26

## 2019-10-26 MED ORDER — NALOXONE HCL 2 MG/2ML IJ SOSY
2.00 | PREFILLED_SYRINGE | INTRAMUSCULAR | Status: DC
Start: ? — End: 2019-10-26

## 2019-10-26 MED ORDER — FAMOTIDINE 10 MG PO TABS
10.00 | ORAL_TABLET | ORAL | Status: DC
Start: 2019-10-27 — End: 2019-10-26

## 2019-10-26 MED ORDER — ONDANSETRON 4 MG PO TBDP
4.00 | ORAL_TABLET | ORAL | Status: DC
Start: ? — End: 2019-10-26

## 2019-10-26 MED ORDER — GLUCOSE 40 % PO GEL
15.00 | ORAL | Status: DC
Start: ? — End: 2019-10-26

## 2019-10-26 MED ORDER — INSULIN LISPRO 100 UNIT/ML ~~LOC~~ SOLN
2.00 | SUBCUTANEOUS | Status: DC
Start: 2019-10-26 — End: 2019-10-26

## 2019-10-26 MED ORDER — AMLODIPINE BESYLATE 5 MG PO TABS
5.00 | ORAL_TABLET | ORAL | Status: DC
Start: 2019-10-27 — End: 2019-10-26

## 2019-10-26 MED ORDER — NITROGLYCERIN 0.4 MG SL SUBL
0.40 | SUBLINGUAL_TABLET | SUBLINGUAL | Status: DC
Start: ? — End: 2019-10-26

## 2019-10-26 MED ORDER — SEVELAMER CARBONATE 800 MG PO TABS
800.00 | ORAL_TABLET | ORAL | Status: DC
Start: 2019-10-26 — End: 2019-10-26

## 2019-10-29 ENCOUNTER — Emergency Department (HOSPITAL_COMMUNITY)
Admission: EM | Admit: 2019-10-29 | Discharge: 2019-10-29 | Discharge status: Against Medical Advice | Payer: Medicare Other

## 2019-10-29 NOTE — ED Triage Notes (Signed)
Pt c/o abd pain and nausea. States missed dialysis on Wed (last dialysis was Monday).

## 2019-10-29 NOTE — ED Notes (Signed)
Patient states that he needs to be "fast tracked" sothat he could get some nausea medicine before dialysis. Pt advised that there would be no "fast tracking", that it would depend on lab results and room availabilty as to how soon he would be seen. Pt states he cannot wait and will call his friend because he cannot miss dialysis.

## 2019-11-01 MED ORDER — SODIUM CHLORIDE 0.9 % IV SOLN
50.00 | INTRAVENOUS | Status: DC
Start: ? — End: 2019-11-01

## 2019-11-02 IMAGING — DX DG ABDOMEN ACUTE W/ 1V CHEST
3 series · 3 of 3 positions shown · non-contrast
Comparison: Abdomen 05/04/2017

CLINICAL DATA: Upper abdominal pain and nausea and vomiting for
couple of days.

EXAM:
DG ABDOMEN ACUTE W/ 1V CHEST

[chest pa]
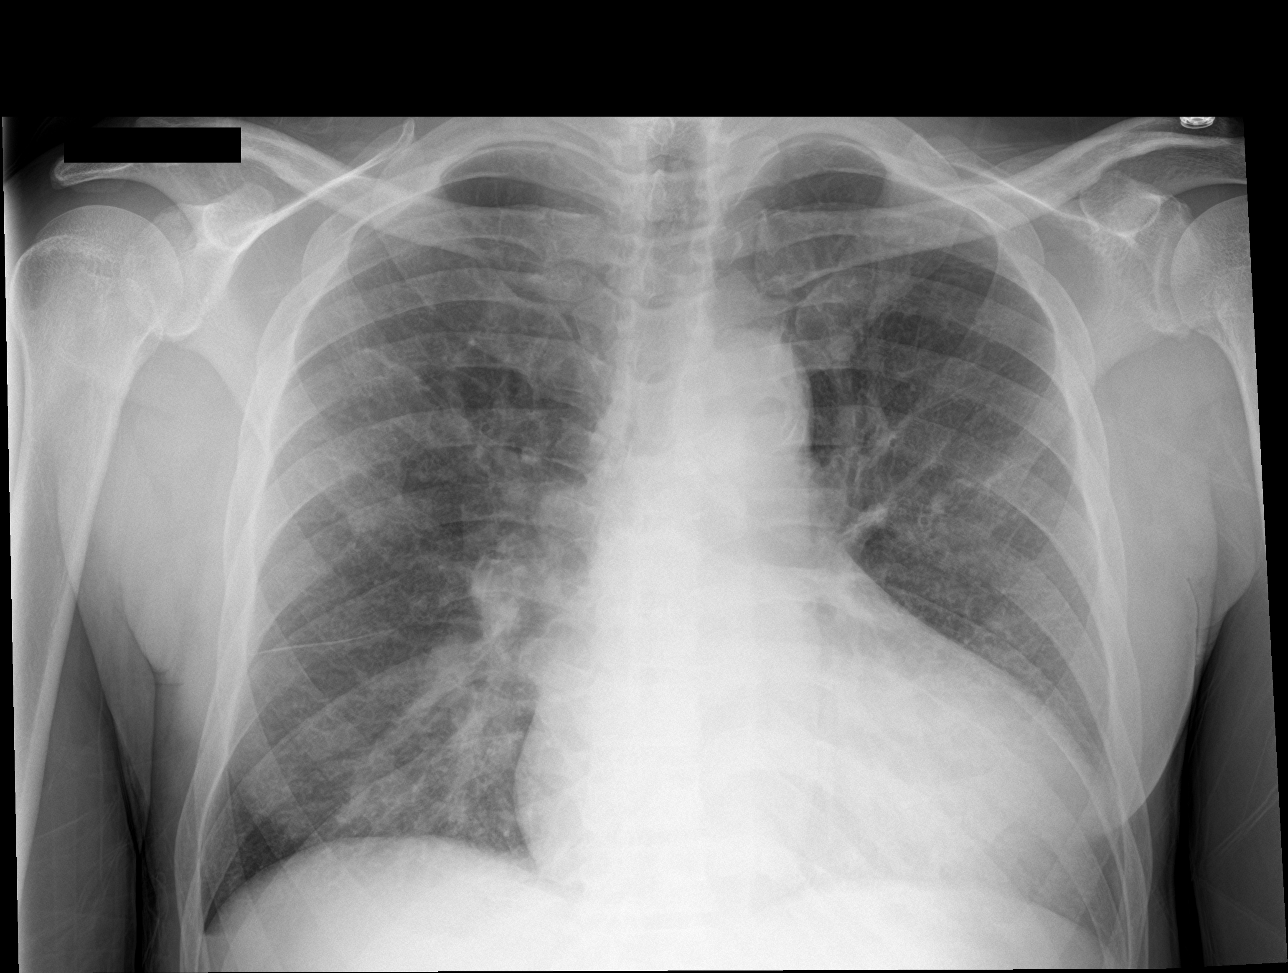

[abdomen erect]
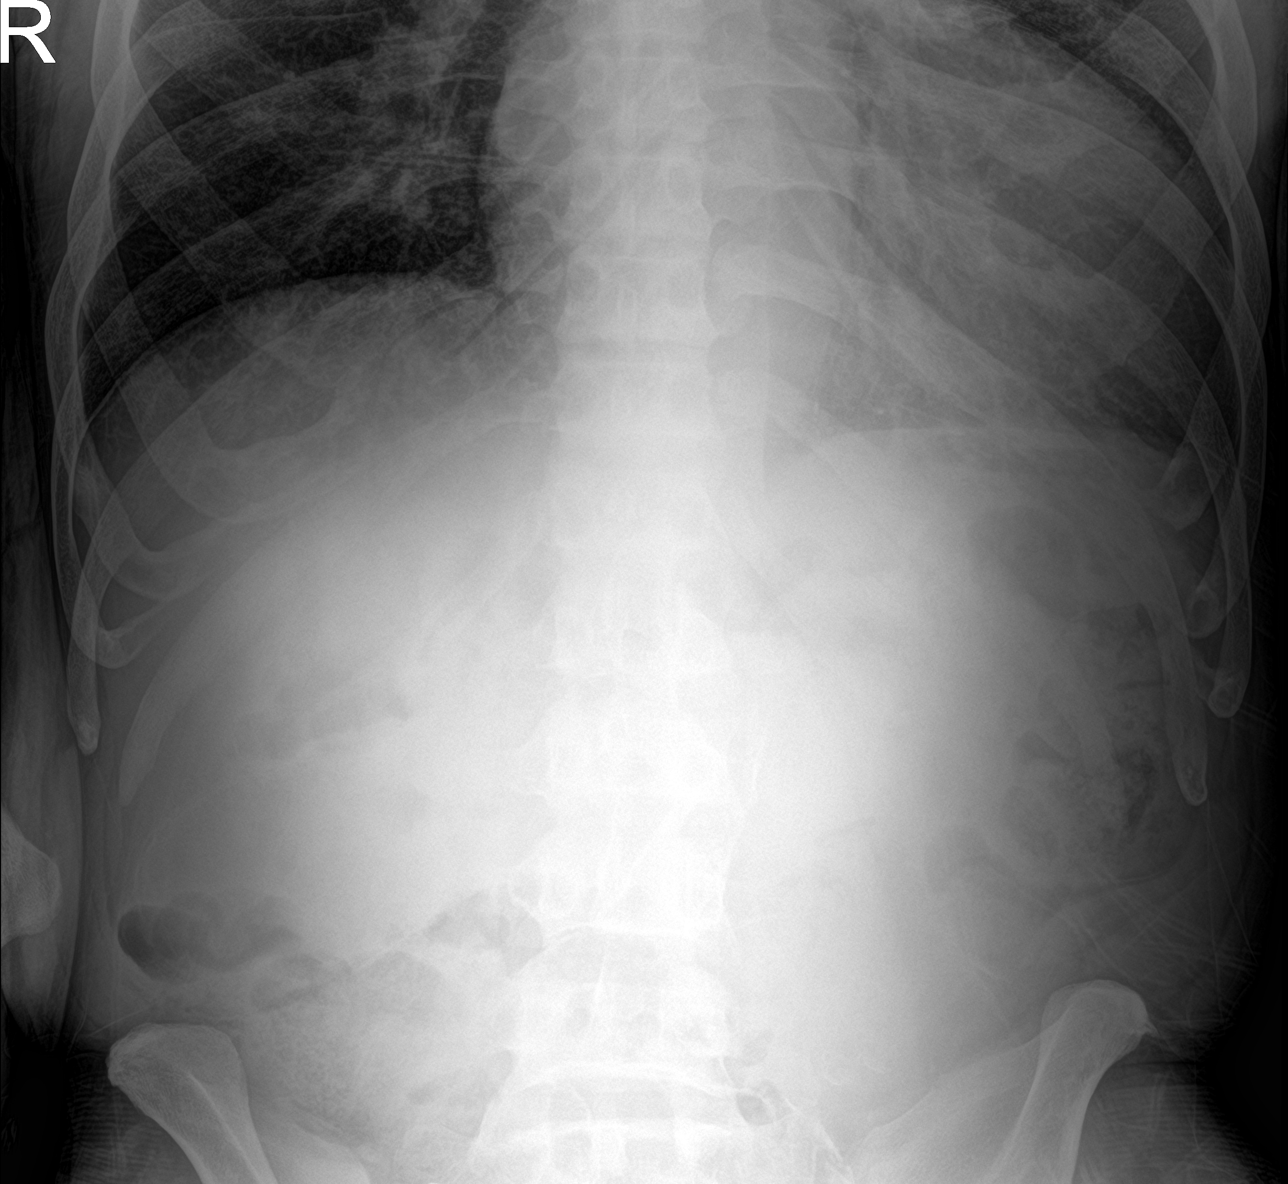

[abdomen supine]
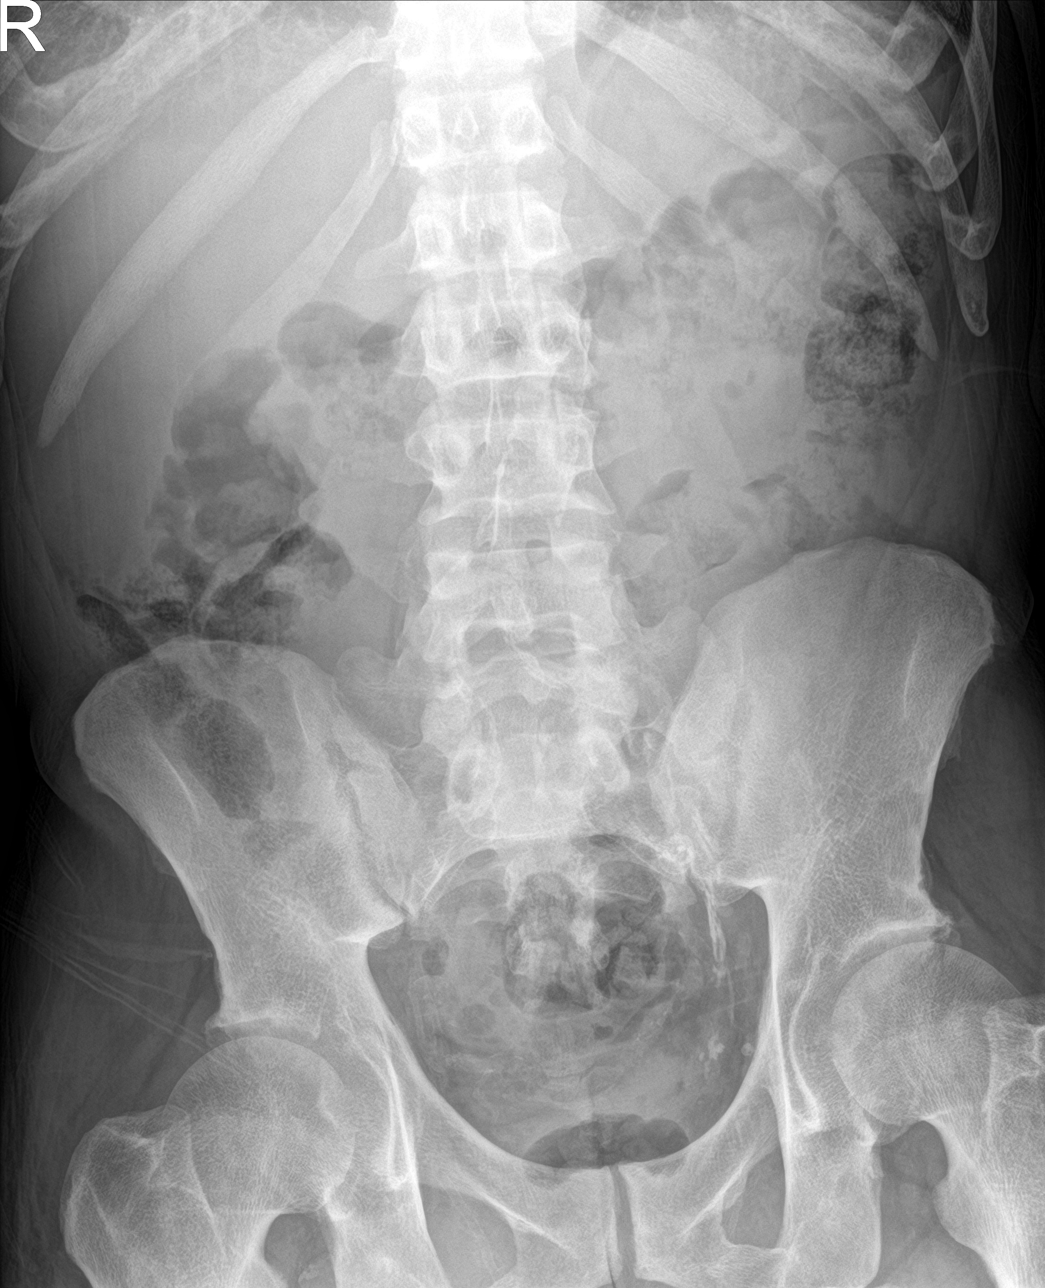

[3 of 3 positions shown; findings below may reference images not displayed]

FINDINGS: Cardiac enlargement with mild vascular congestion. No edema or
consolidation. No blunting of costophrenic angles. No pneumothorax.
Calcification of the aorta.

Gas and stool throughout the colon. No small or large bowel
distention. No free intra-abdominal air. No radiopaque stones.
Vascular calcifications. Visualized bones appear intact.
IMPRESSION: Cardiac enlargement with mild vascular congestion. No edema or
consolidation. Nonobstructive bowel gas pattern. Aortic
atherosclerosis.
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
## 2019-11-14 IMAGING — DX DG ABDOMEN 1V
1 series · 1 of 1 positions shown · non-contrast
Comparison: CT 06/24/2017

CLINICAL DATA: Abdominal pain

EXAM:
ABDOMEN - 1 VIEW

[abdomen kub]
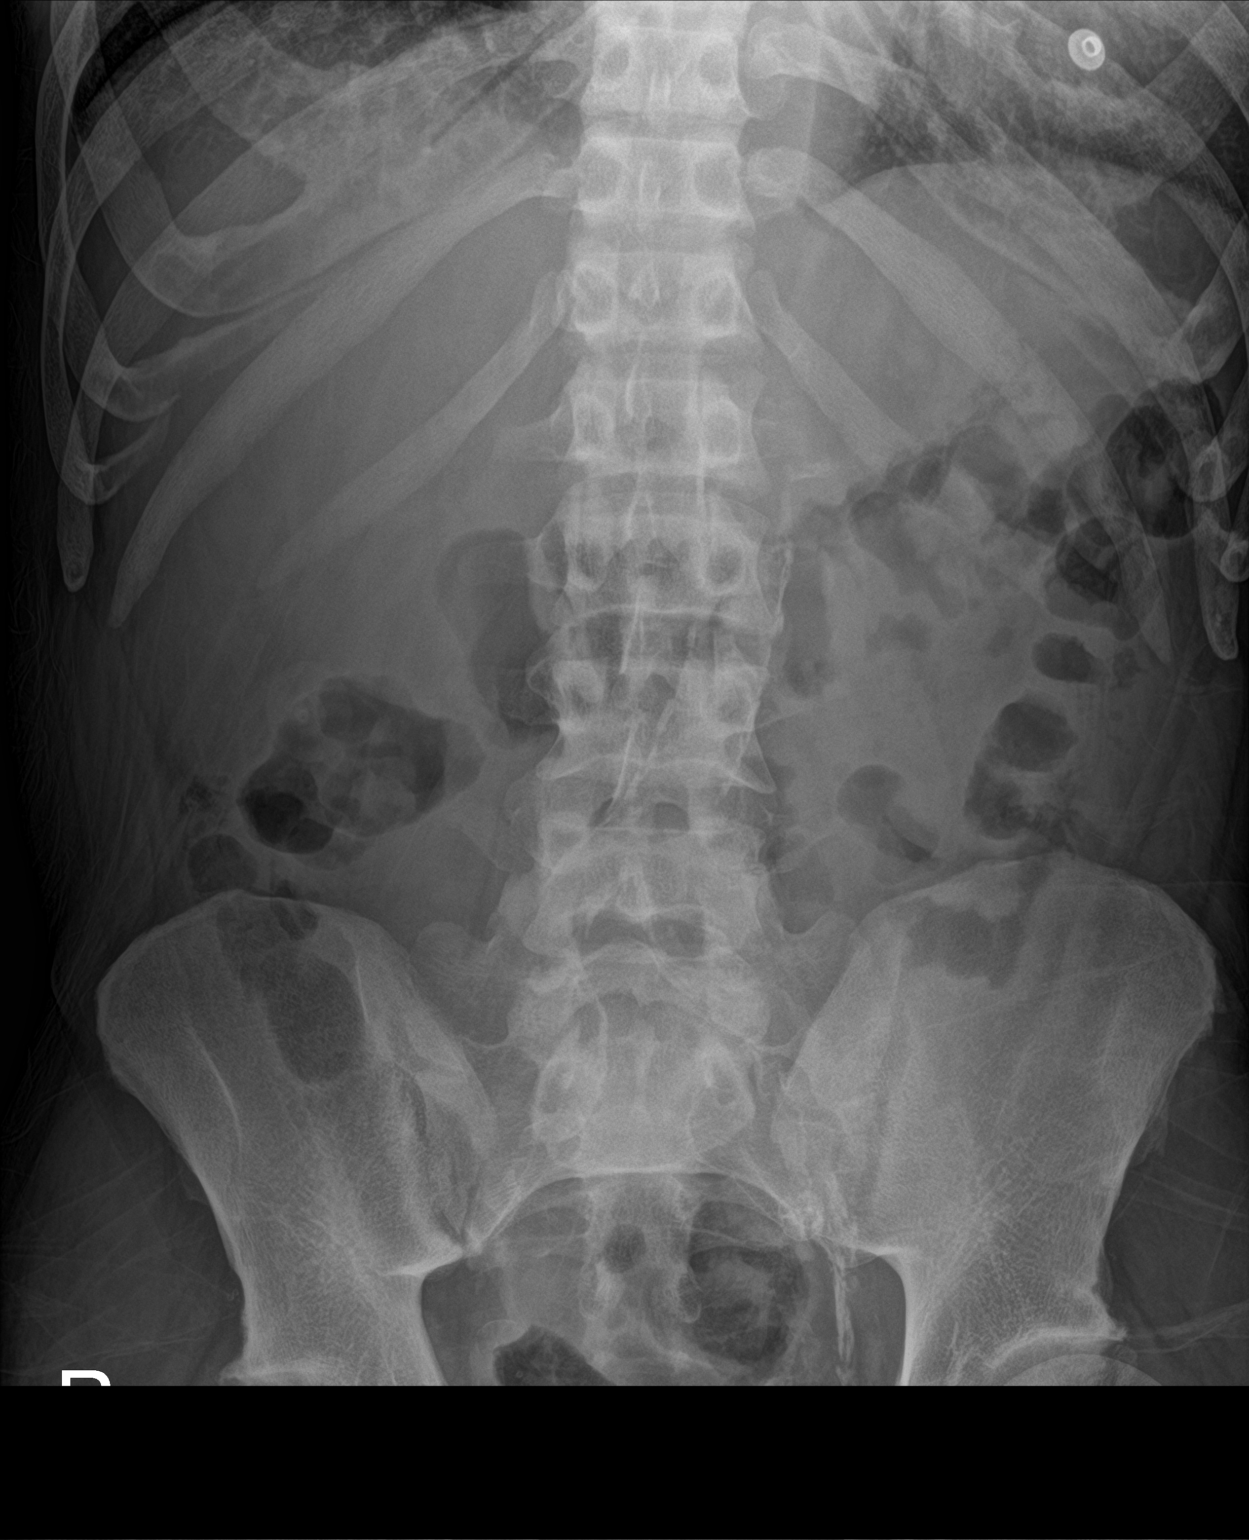

[1 of 1 positions shown; findings below may reference images not displayed]

FINDINGS: Normal bowel gas pattern. Negative for obstruction or ileus. No
urinary tract calculi. Vascular calcification. No acute skeletal
abnormality.
IMPRESSION: Negative.
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
## 2019-11-15 ENCOUNTER — Emergency Department (HOSPITAL_COMMUNITY)
Admission: EM | Admit: 2019-11-15 | Discharge: 2019-11-15 | Discharge status: Against Medical Advice | Payer: Medicare Other | Attending: Emergency Medicine | Admitting: Emergency Medicine

## 2019-11-15 ENCOUNTER — Other Ambulatory Visit: Payer: Self-pay

## 2019-11-15 DIAGNOSIS — R109 Unspecified abdominal pain: Secondary | ICD-10-CM | POA: Diagnosis not present

## 2019-11-15 DIAGNOSIS — Z5321 Procedure and treatment not carried out due to patient leaving prior to being seen by health care provider: Secondary | ICD-10-CM | POA: Insufficient documentation

## 2019-11-15 LAB — CBG MONITORING, ED: Glucose-Capillary: 75 mg/dL (ref 70–99)

## 2019-11-15 NOTE — ED Triage Notes (Signed)
Pt c/o abd pain. States has been compliant with dialysis. C/o N/V.

## 2019-11-15 NOTE — ED Notes (Signed)
Pt said he was leaving

## 2019-11-15 NOTE — ED Notes (Signed)
Pt requested oxygen. Pt advised that oxygen is a medication and that his oxygen saturation is WNL. O2 not to be applied at this time.

## 2019-11-15 NOTE — ED Notes (Signed)
Pt refused to allow phlebotomy to draw labs.

## 2019-11-18 IMAGING — DX DG CHEST 2V
2 series · 2 of 2 positions shown · non-contrast
Comparison: Chest radiograph performed 06/13/2017, and CT of the
chest performed 06/24/2017

CLINICAL DATA: Acute onset of mid chest pain and shortness of
breath.

EXAM:
CHEST  2 VIEW

[chest lat]
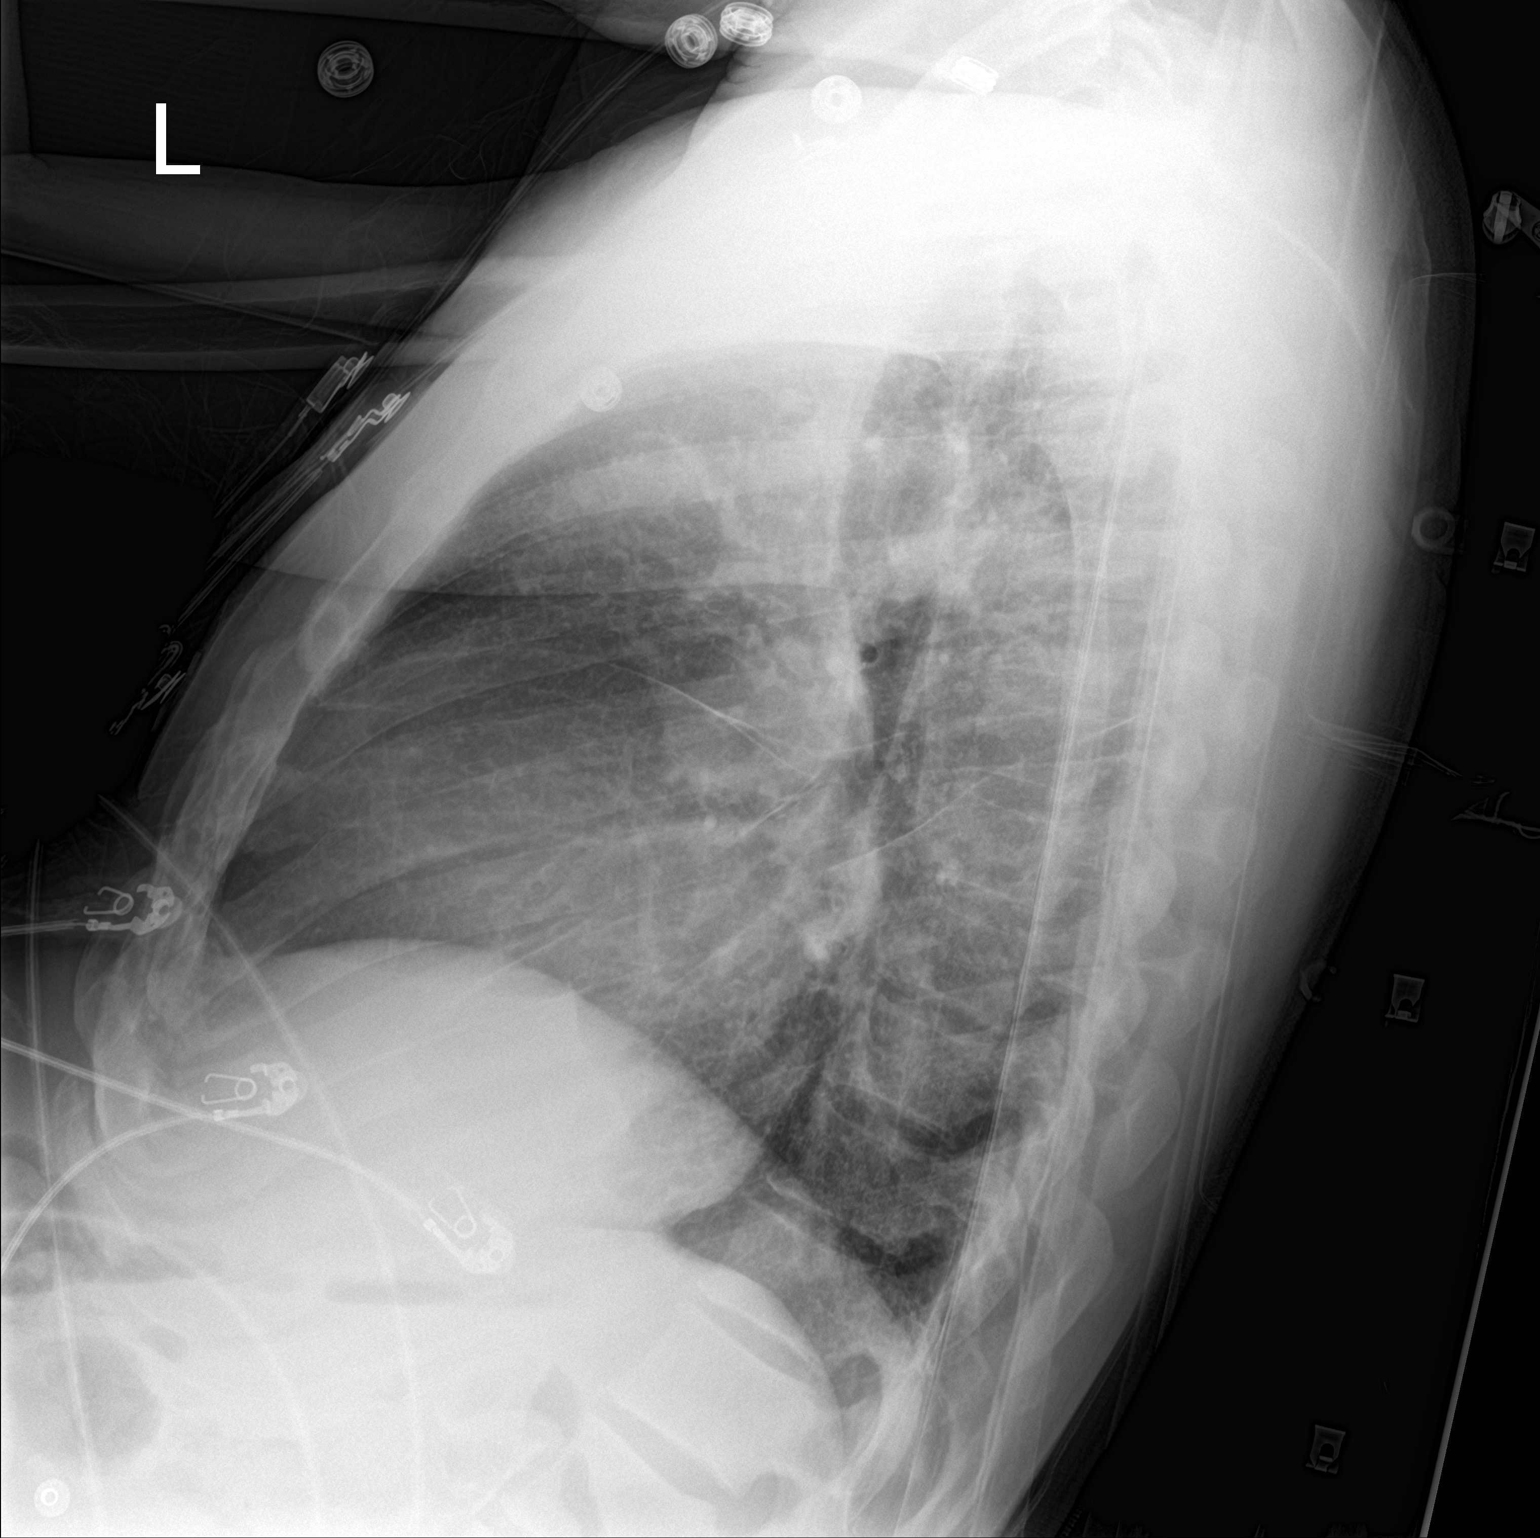

[chest ap]
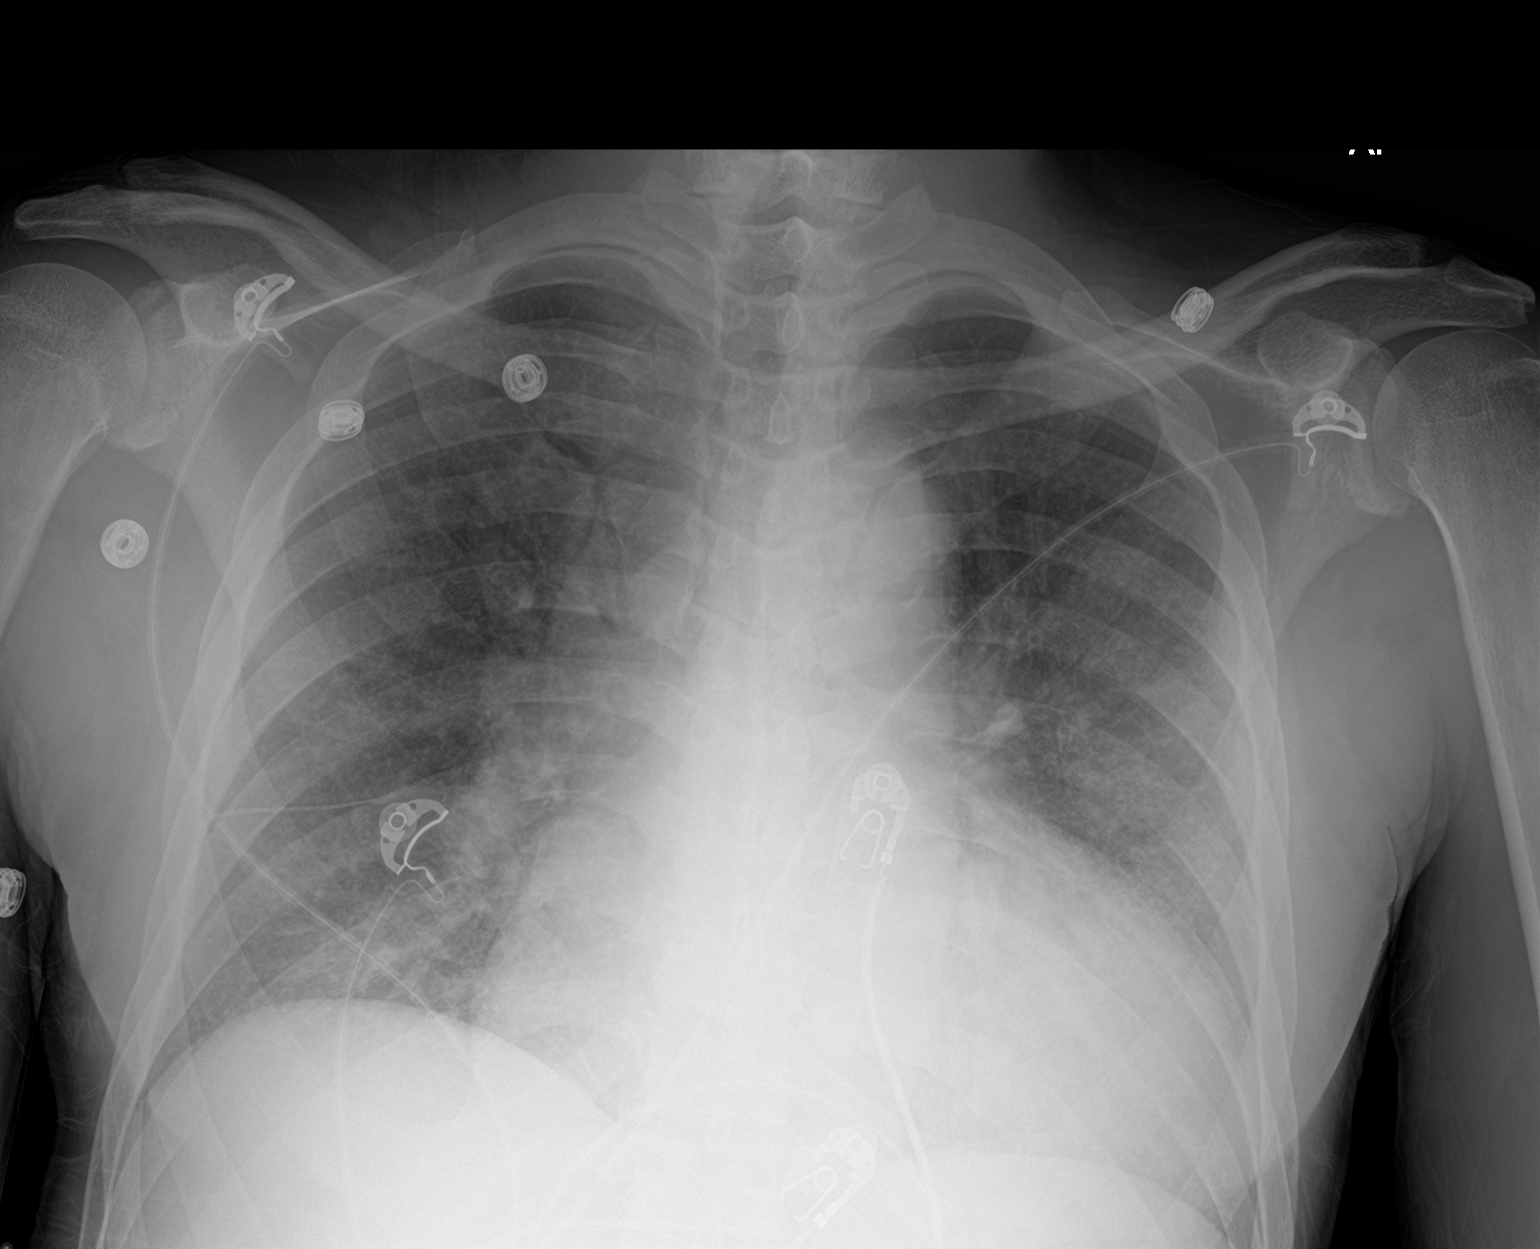

[2 of 2 positions shown; findings below may reference images not displayed]

FINDINGS: The lungs are well-aerated. Left midlung airspace opacity may
reflect pneumonia or possibly interstitial edema. Vascular
congestion is noted. No pleural effusion or pneumothorax is seen.

The heart is mildly enlarged. No acute osseous abnormalities are
seen.
IMPRESSION: Left midlung airspace opacity may reflect pneumonia or possibly
interstitial edema. Vascular congestion and mild cardiomegaly noted.

## 2019-11-22 ENCOUNTER — Emergency Department (HOSPITAL_COMMUNITY): Payer: Medicare Other

## 2019-11-22 ENCOUNTER — Encounter (HOSPITAL_COMMUNITY): Payer: Self-pay | Admitting: Emergency Medicine

## 2019-11-22 ENCOUNTER — Other Ambulatory Visit: Payer: Self-pay

## 2019-11-22 ENCOUNTER — Emergency Department (HOSPITAL_COMMUNITY)
Admission: EM | Admit: 2019-11-22 | Discharge: 2019-11-22 | Disposition: A | Discharge status: Home / Self Care | Payer: Medicare Other | Attending: Emergency Medicine | Admitting: Emergency Medicine

## 2019-11-22 DIAGNOSIS — I2510 Atherosclerotic heart disease of native coronary artery without angina pectoris: Secondary | ICD-10-CM | POA: Diagnosis not present

## 2019-11-22 DIAGNOSIS — Z79899 Other long term (current) drug therapy: Secondary | ICD-10-CM | POA: Diagnosis not present

## 2019-11-22 DIAGNOSIS — Z87891 Personal history of nicotine dependence: Secondary | ICD-10-CM | POA: Insufficient documentation

## 2019-11-22 DIAGNOSIS — W06XXXA Fall from bed, initial encounter: Secondary | ICD-10-CM | POA: Insufficient documentation

## 2019-11-22 DIAGNOSIS — Y929 Unspecified place or not applicable: Secondary | ICD-10-CM | POA: Diagnosis not present

## 2019-11-22 DIAGNOSIS — I5022 Chronic systolic (congestive) heart failure: Secondary | ICD-10-CM | POA: Insufficient documentation

## 2019-11-22 DIAGNOSIS — Y999 Unspecified external cause status: Secondary | ICD-10-CM | POA: Insufficient documentation

## 2019-11-22 DIAGNOSIS — N186 End stage renal disease: Secondary | ICD-10-CM | POA: Diagnosis not present

## 2019-11-22 DIAGNOSIS — R109 Unspecified abdominal pain: Secondary | ICD-10-CM | POA: Insufficient documentation

## 2019-11-22 DIAGNOSIS — Z7984 Long term (current) use of oral hypoglycemic drugs: Secondary | ICD-10-CM | POA: Insufficient documentation

## 2019-11-22 DIAGNOSIS — I150 Renovascular hypertension: Secondary | ICD-10-CM

## 2019-11-22 DIAGNOSIS — Y939 Activity, unspecified: Secondary | ICD-10-CM | POA: Diagnosis not present

## 2019-11-22 DIAGNOSIS — Z992 Dependence on renal dialysis: Secondary | ICD-10-CM | POA: Diagnosis not present

## 2019-11-22 DIAGNOSIS — S0990XA Unspecified injury of head, initial encounter: Secondary | ICD-10-CM | POA: Diagnosis not present

## 2019-11-22 DIAGNOSIS — E1122 Type 2 diabetes mellitus with diabetic chronic kidney disease: Secondary | ICD-10-CM | POA: Insufficient documentation

## 2019-11-22 DIAGNOSIS — I132 Hypertensive heart and chronic kidney disease with heart failure and with stage 5 chronic kidney disease, or end stage renal disease: Secondary | ICD-10-CM | POA: Diagnosis not present

## 2019-11-22 DIAGNOSIS — G8929 Other chronic pain: Secondary | ICD-10-CM

## 2019-11-22 DIAGNOSIS — Z7901 Long term (current) use of anticoagulants: Secondary | ICD-10-CM | POA: Diagnosis not present

## 2019-11-22 LAB — CBC WITH DIFFERENTIAL/PLATELET
Abs Immature Granulocytes: 0.02 10*3/uL (ref 0.00–0.07)
Basophils Absolute: 0 10*3/uL (ref 0.0–0.1)
Basophils Relative: 0 %
Eosinophils Absolute: 0.4 10*3/uL (ref 0.0–0.5)
Eosinophils Relative: 7 %
HCT: 27.4 % — ABNORMAL LOW (ref 39.0–52.0)
Hemoglobin: 8.6 g/dL — ABNORMAL LOW (ref 13.0–17.0)
Immature Granulocytes: 0 %
Lymphocytes Relative: 9 %
Lymphs Abs: 0.4 10*3/uL — ABNORMAL LOW (ref 0.7–4.0)
MCH: 28.7 pg (ref 26.0–34.0)
MCHC: 31.4 g/dL (ref 30.0–36.0)
MCV: 91.3 fL (ref 80.0–100.0)
Monocytes Absolute: 0.5 10*3/uL (ref 0.1–1.0)
Monocytes Relative: 9 %
Neutro Abs: 3.7 10*3/uL (ref 1.7–7.7)
Neutrophils Relative %: 75 %
Platelets: 169 10*3/uL (ref 150–400)
RBC: 3 MIL/uL — ABNORMAL LOW (ref 4.22–5.81)
RDW: 16.9 % — ABNORMAL HIGH (ref 11.5–15.5)
WBC: 5 10*3/uL (ref 4.0–10.5)
nRBC: 0 % (ref 0.0–0.2)

## 2019-11-22 LAB — LIPASE, BLOOD: Lipase: 119 U/L — ABNORMAL HIGH (ref 11–51)

## 2019-11-22 LAB — HEPATIC FUNCTION PANEL
ALT: 16 U/L (ref 0–44)
AST: 35 U/L (ref 15–41)
Albumin: 3.5 g/dL (ref 3.5–5.0)
Alkaline Phosphatase: 226 U/L — ABNORMAL HIGH (ref 38–126)
Bilirubin, Direct: 0.3 mg/dL — ABNORMAL HIGH (ref 0.0–0.2)
Indirect Bilirubin: 0.7 mg/dL (ref 0.3–0.9)
Total Bilirubin: 1 mg/dL (ref 0.3–1.2)
Total Protein: 8 g/dL (ref 6.5–8.1)

## 2019-11-22 LAB — CBG MONITORING, ED: Glucose-Capillary: 96 mg/dL (ref 70–99)

## 2019-11-22 LAB — BASIC METABOLIC PANEL
Anion gap: 20 — ABNORMAL HIGH (ref 5–15)
BUN: 54 mg/dL — ABNORMAL HIGH (ref 6–20)
CO2: 21 mmol/L — ABNORMAL LOW (ref 22–32)
Calcium: 8.3 mg/dL — ABNORMAL LOW (ref 8.9–10.3)
Chloride: 96 mmol/L — ABNORMAL LOW (ref 98–111)
Creatinine, Ser: 11.32 mg/dL — ABNORMAL HIGH (ref 0.61–1.24)
GFR calc Af Amer: 5 mL/min — ABNORMAL LOW (ref >60)
GFR calc non Af Amer: 4 mL/min — ABNORMAL LOW (ref >60)
Glucose, Bld: 93 mg/dL (ref 70–99)
Potassium: 4.3 mmol/L (ref 3.5–5.1)
Sodium: 137 mmol/L (ref 135–145)

## 2019-11-22 IMAGING — CR DG ABDOMEN 1V
1 series · 1 of 1 positions shown · non-contrast
Comparison: 06/25/2017 and previous

CLINICAL DATA: Left upper quadrant pain for 2 days - hurts to lay
down - didn't really want to talk about any symptoms - hx of CHF,
diabetes, htn, renal insufficiency (on dialysis), DVT

EXAM:
ABDOMEN - 1 VIEW

[abdomen kub]
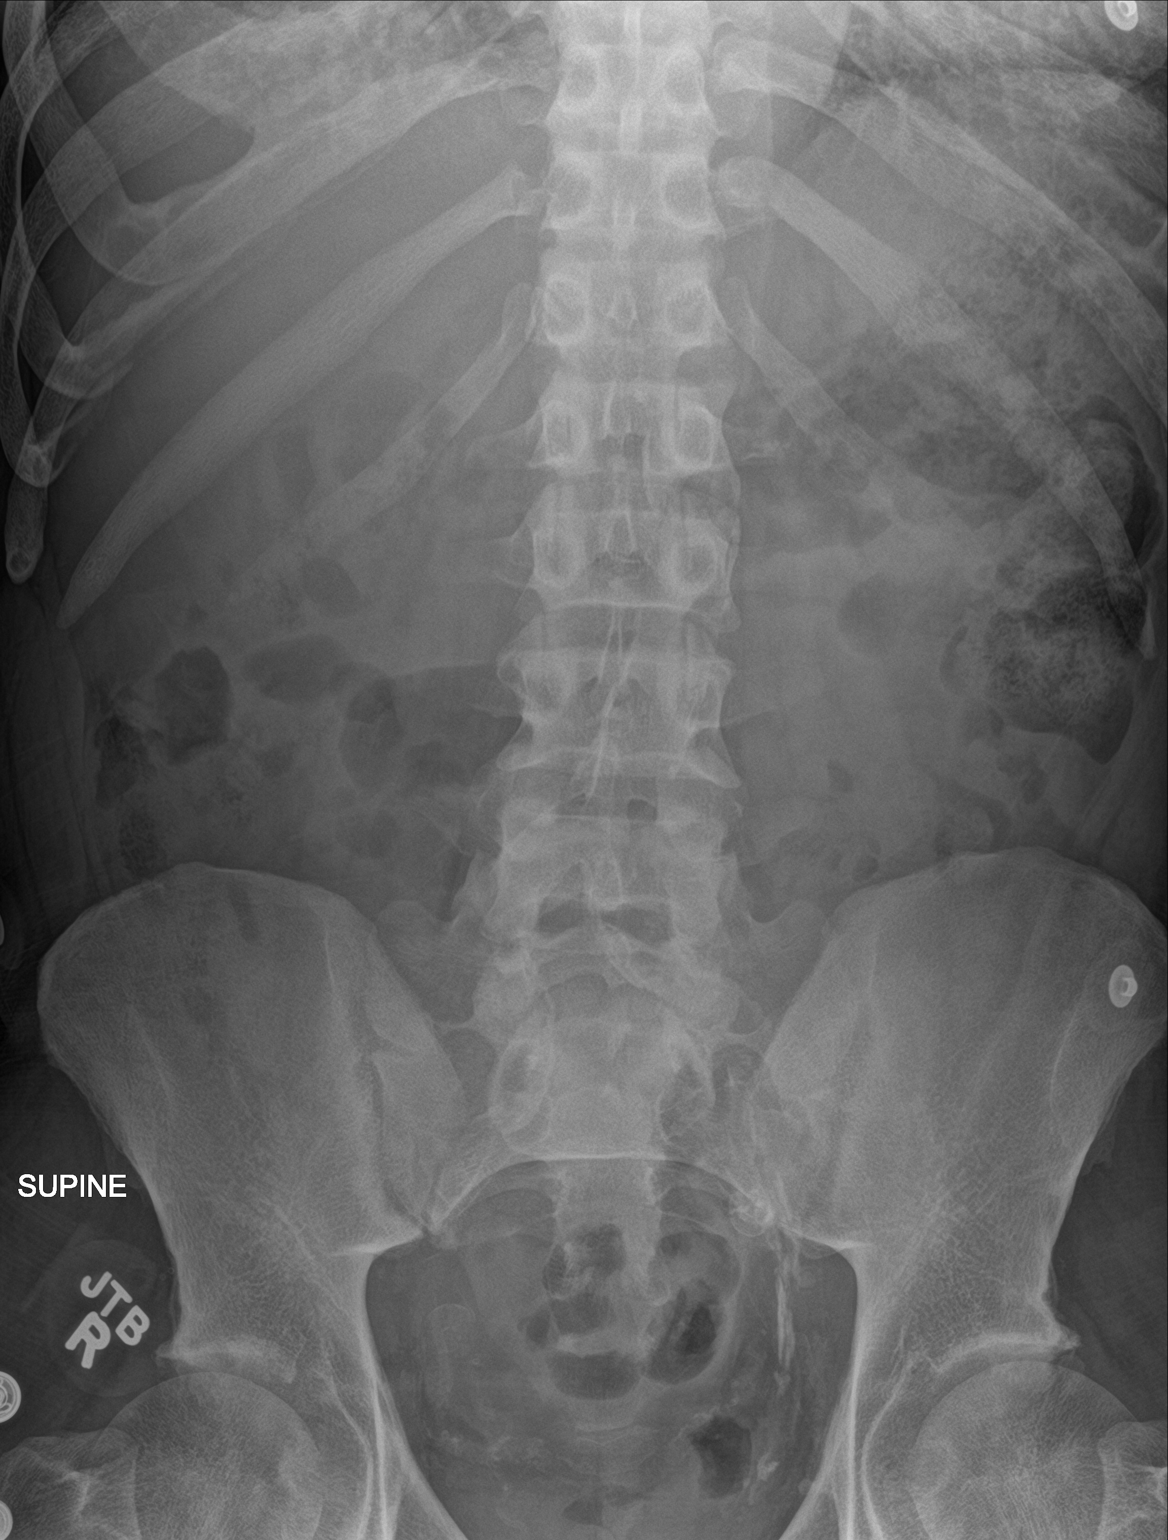

[1 of 1 positions shown; findings below may reference images not displayed]

FINDINGS: Small bowel is decompressed. Moderate fecal material in the splenic
flexure region of the colon which is otherwise decompressed
unremarkable.

Aortic and pelvic vascular calcifications.

Regional bones unremarkable.
IMPRESSION: Nonobstructive bowel gas pattern.

Aortic Atherosclerosis ([id number]-170.0)

## 2019-11-22 MED ORDER — HYDRALAZINE HCL 25 MG PO TABS
100.0000 mg | ORAL_TABLET | Freq: Three times a day (TID) | ORAL | Status: DC
  Administered 2019-11-22: 100 mg via ORAL
  Filled 2019-11-22: qty 4

## 2019-11-22 MED ORDER — HYDROMORPHONE HCL 1 MG/ML IJ SOLN
1.0000 mg | Freq: Once | INTRAMUSCULAR | Status: DC
  Filled 2019-11-22: qty 1

## 2019-11-22 MED ORDER — AMLODIPINE BESYLATE 5 MG PO TABS
5.0000 mg | ORAL_TABLET | Freq: Every day | ORAL | Status: DC
  Administered 2019-11-22: 5 mg via ORAL
  Filled 2019-11-22: qty 1

## 2019-11-22 MED ORDER — PANTOPRAZOLE SODIUM 40 MG PO TBEC
40.0000 mg | DELAYED_RELEASE_TABLET | Freq: Two times a day (BID) | ORAL | Status: DC
  Filled 2019-11-22: qty 1

## 2019-11-22 MED ORDER — HYDROMORPHONE HCL 1 MG/ML IJ SOLN
0.5000 mg | Freq: Once | INTRAMUSCULAR | Status: DC
  Filled 2019-11-22: qty 1

## 2019-11-22 MED ORDER — HYDROMORPHONE HCL 1 MG/ML IJ SOLN
0.5000 mg | Freq: Once | INTRAMUSCULAR | Status: AC
  Administered 2019-11-22: 0.5 mg via INTRAMUSCULAR

## 2019-11-22 MED ORDER — PROMETHAZINE HCL 25 MG/ML IJ SOLN
12.5000 mg | Freq: Once | INTRAMUSCULAR | Status: AC
  Administered 2019-11-22: 12.5 mg via INTRAMUSCULAR

## 2019-11-22 MED ORDER — PROMETHAZINE HCL 25 MG/ML IJ SOLN
12.5000 mg | Freq: Once | INTRAMUSCULAR | Status: DC
  Filled 2019-11-22: qty 1

## 2019-11-22 MED ORDER — HYDROMORPHONE HCL 1 MG/ML IJ SOLN
1.0000 mg | Freq: Once | INTRAMUSCULAR | Status: AC
  Administered 2019-11-22: 1 mg via INTRAMUSCULAR

## 2019-11-22 MED ORDER — CARVEDILOL 12.5 MG PO TABS
25.0000 mg | ORAL_TABLET | Freq: Two times a day (BID) | ORAL | Status: DC
  Administered 2019-11-22: 25 mg via ORAL
  Filled 2019-11-22: qty 2

## 2019-11-22 MED ORDER — HYDRALAZINE HCL 100 MG PO TABS: 100.0000 mg | ORAL_TABLET | Freq: Three times a day (TID) | ORAL | Status: DC

## 2019-11-22 NOTE — Discharge Instructions (Addendum)
You are seen in the ER for fall and abdominal pain. You have declined a CT scan of the abdomen.  The work-up in the ER is reassuring.  No brain bleed seen.  Your blood pressure is high.  We have given your oral home medications, but your blood pressure will likely improve with the dialysis.  Make sure you get your dialysis as scheduled today at noon.

## 2019-11-22 NOTE — ED Provider Notes (Signed)
  Physical Exam  BP (!) 255/146   Pulse 77   Temp 98 F (36.7 C) (Oral)   Resp 17   Ht _0  (1.88 m)   Wt 81.6 kg   SpO2 100%   BMI 23.11 kg/m   Physical Exam  ED Course/Procedures   Clinical Course as of Nov 21 1040  Mon Nov 22, 2019  0930 Patient is declining CT scan. Lab results reviewed.  Lipase is slightly elevated.  Patient is known to have chronic abdominal pain.  He informed me that he is having difficulty laying flat, which is why he is declining CT scan.  On my assessment patient was able to sleep flat.  I asked him to lay flat just for 2 minutes, so that we can get the CT brain.   On lung exam, patient has no rales.  Chest x-ray shows pulmonary vascular congestion, pleural effusions.  Patient's BP is elevated.  I will order his home meds   [AN]  1036 Patient is declining CT scan of the abdomen. CT brain is normal.  Patient states that this is the pain he always has, which is why he just wants pain medications.  I will cancel the CT scan.  Patient's abdominal exam is unchanged, he has been here now 6 hours, x-ray of the chest is not showing any free air.  He has no peritoneal findings.  CT brain is negative.  Home medications were administered few minutes back per nurse.  Patient is supposed to get dialysis at noon.  He is noted to have elevated pressure at this time, however the dialysis will probably be the definitive treatment.  He just received oral meds within the last 15 minutes, therefore the pressure will likely improve.  He has no headaches, chest pain, new neurologic symptoms, nausea or vomiting.  We will proceed with discharge.  Patient is comfortable with the plan, in fact he wants to get his pain medication so that he can get to his dialysis on time.   [AN]    Clinical Course User Index [AN] Varney Biles, MD    Procedures  MDM   Assuming care of patient from Dr Rancour   Patient in the ED for fall on Eliquis, also complaining of abdominal  pain. Patient has history of ESRD and chronic abdominal pain. Workup thus far shows EKG that is reassuring.  Labs are pending.  CT scan of the abdomen and head ordered.  Concerning findings are as following : none Important pending results are lab work-up and CT scan.  According to Dr. Wyvonnia Dusky, plan is to d/c the patient if the workup is reassuring.   Patient had no complains, no concerns from the nursing side. Will continue to monitor.        Varney Biles, MD 11/22/19 1044

## 2019-11-22 NOTE — ED Notes (Signed)
Agreeing to go to CT now

## 2019-11-22 NOTE — Progress Notes (Signed)
Labs drawn by RT sent to lab by RN
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
## 2019-11-22 NOTE — Progress Notes (Signed)
In Progress  11/22/19 6:39 AM [place], Vida Roller, RN  IV team consulted for PIV access. This RN attempted 3 times with ultrasound unsuccessfully. Primary RN informed.
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
## 2019-11-22 NOTE — Progress Notes (Signed)
TOC CM spoke to pt and states he needs to pick up his car from [facility] before he goes to Dialysis at 1 pm. [facility] on [address], [place]. [phone]. Cabin crew signed and emailed to [employer]. [facility] transportation arranged for pt. [place], [facility] ED TOC CM [phone]

## 2019-11-25 MED ORDER — DIPHENHYDRAMINE HCL 25 MG PO CAPS
25.00 | ORAL_CAPSULE | ORAL | Status: DC
Start: ? — End: 2019-11-25

## 2019-11-25 MED ORDER — FAMOTIDINE 10 MG PO TABS
10.00 | ORAL_TABLET | ORAL | Status: DC
Start: 2019-11-27 — End: 2019-11-25

## 2019-11-25 MED ORDER — CLONIDINE HCL 0.1 MG PO TABS
0.10 | ORAL_TABLET | ORAL | Status: DC
Start: ? — End: 2019-11-25

## 2019-11-25 MED ORDER — TEMAZEPAM 15 MG PO CAPS
15.00 | ORAL_CAPSULE | ORAL | Status: DC
Start: 2019-11-26 — End: 2019-11-25

## 2019-11-25 MED ORDER — FERROUS SULFATE 325 (65 FE) MG PO TABS
325.00 | ORAL_TABLET | ORAL | Status: DC
Start: 2019-11-27 — End: 2019-11-25

## 2019-11-25 MED ORDER — SEVELAMER CARBONATE 800 MG PO TABS
800.00 | ORAL_TABLET | ORAL | Status: DC
Start: 2019-11-26 — End: 2019-11-25

## 2019-11-25 MED ORDER — PANTOPRAZOLE SODIUM 40 MG PO TBEC
40.00 | DELAYED_RELEASE_TABLET | ORAL | Status: DC
Start: 2019-11-27 — End: 2019-11-25

## 2019-11-25 MED ORDER — DSS 100 MG PO CAPS
100.00 | ORAL_CAPSULE | ORAL | Status: DC
Start: ? — End: 2019-11-25

## 2019-11-25 MED ORDER — ONDANSETRON HCL 4 MG/2ML IJ SOLN
4.00 | INTRAMUSCULAR | Status: DC
Start: ? — End: 2019-11-25

## 2019-11-25 MED ORDER — HYDRALAZINE HCL 50 MG PO TABS
100.00 | ORAL_TABLET | ORAL | Status: DC
Start: 2019-11-26 — End: 2019-11-25

## 2019-11-25 MED ORDER — DEXTROMETHORPHAN-GUAIFENESIN 10-100 MG/5ML PO LIQD
5.00 | ORAL | Status: DC
Start: ? — End: 2019-11-25

## 2019-11-25 MED ORDER — ALUM & MAG HYDROXIDE-SIMETH 200-200-20 MG/5ML PO SUSP
30.00 | ORAL | Status: DC
Start: ? — End: 2019-11-25

## 2019-11-25 MED ORDER — APIXABAN 5 MG PO TABS
5.00 | ORAL_TABLET | ORAL | Status: DC
Start: 2019-11-26 — End: 2019-11-25

## 2019-11-25 MED ORDER — NEPHRO-VITE 0.8 MG PO TABS
1.00 | ORAL_TABLET | ORAL | Status: DC
Start: 2019-11-27 — End: 2019-11-25

## 2019-11-25 MED ORDER — NITROGLYCERIN 0.4 MG SL SUBL
0.40 | SUBLINGUAL_TABLET | SUBLINGUAL | Status: DC
Start: ? — End: 2019-11-25

## 2019-11-25 MED ORDER — CARVEDILOL 12.5 MG PO TABS
25.00 | ORAL_TABLET | ORAL | Status: DC
Start: 2019-11-26 — End: 2019-11-25

## 2019-11-25 MED ORDER — AMLODIPINE BESYLATE 5 MG PO TABS
10.00 | ORAL_TABLET | ORAL | Status: DC
Start: 2019-11-27 — End: 2019-11-25

## 2019-11-25 MED ORDER — OXYCODONE HCL 5 MG PO TABS
10.00 | ORAL_TABLET | ORAL | Status: DC
Start: ? — End: 2019-11-25

## 2019-11-25 MED ORDER — IPRATROPIUM-ALBUTEROL 0.5-2.5 (3) MG/3ML IN SOLN
3.00 | RESPIRATORY_TRACT | Status: DC
Start: ? — End: 2019-11-25

## 2019-11-25 MED ORDER — BISACODYL 5 MG PO TBEC
10.00 | DELAYED_RELEASE_TABLET | ORAL | Status: DC
Start: ? — End: 2019-11-25

## 2019-11-25 MED ORDER — OXYCODONE HCL 15 MG PO TABS
15.00 | ORAL_TABLET | ORAL | Status: DC
Start: ? — End: 2019-11-25

## 2019-11-25 MED ORDER — SCOPOLAMINE 1 MG/3DAYS TD PT72
1.00 | MEDICATED_PATCH | TRANSDERMAL | Status: DC
Start: 2019-11-27 — End: 2019-11-25

## 2019-11-28 ENCOUNTER — Other Ambulatory Visit: Payer: Self-pay

## 2019-11-28 ENCOUNTER — Emergency Department (HOSPITAL_COMMUNITY): Payer: Medicare Other

## 2019-11-28 ENCOUNTER — Emergency Department (HOSPITAL_COMMUNITY)
Admission: EM | Admit: 2019-11-28 | Discharge: 2019-11-28 | Disposition: A | Discharge status: Home / Self Care | Payer: Medicare Other | Attending: Emergency Medicine | Admitting: Emergency Medicine

## 2019-11-28 ENCOUNTER — Encounter (HOSPITAL_COMMUNITY): Payer: Self-pay | Admitting: Emergency Medicine

## 2019-11-28 DIAGNOSIS — Z992 Dependence on renal dialysis: Secondary | ICD-10-CM | POA: Insufficient documentation

## 2019-11-28 DIAGNOSIS — I132 Hypertensive heart and chronic kidney disease with heart failure and with stage 5 chronic kidney disease, or end stage renal disease: Secondary | ICD-10-CM | POA: Insufficient documentation

## 2019-11-28 DIAGNOSIS — N186 End stage renal disease: Secondary | ICD-10-CM | POA: Insufficient documentation

## 2019-11-28 DIAGNOSIS — Z79899 Other long term (current) drug therapy: Secondary | ICD-10-CM | POA: Insufficient documentation

## 2019-11-28 DIAGNOSIS — E1122 Type 2 diabetes mellitus with diabetic chronic kidney disease: Secondary | ICD-10-CM | POA: Diagnosis not present

## 2019-11-28 DIAGNOSIS — G8929 Other chronic pain: Secondary | ICD-10-CM | POA: Diagnosis not present

## 2019-11-28 DIAGNOSIS — R112 Nausea with vomiting, unspecified: Secondary | ICD-10-CM | POA: Diagnosis not present

## 2019-11-28 DIAGNOSIS — I5043 Acute on chronic combined systolic (congestive) and diastolic (congestive) heart failure: Secondary | ICD-10-CM | POA: Insufficient documentation

## 2019-11-28 DIAGNOSIS — Z7901 Long term (current) use of anticoagulants: Secondary | ICD-10-CM | POA: Insufficient documentation

## 2019-11-28 DIAGNOSIS — Z87891 Personal history of nicotine dependence: Secondary | ICD-10-CM | POA: Diagnosis not present

## 2019-11-28 DIAGNOSIS — R1084 Generalized abdominal pain: Secondary | ICD-10-CM | POA: Diagnosis present

## 2019-11-28 DIAGNOSIS — K861 Other chronic pancreatitis: Secondary | ICD-10-CM | POA: Diagnosis not present

## 2019-11-28 LAB — COMPREHENSIVE METABOLIC PANEL
ALT: 15 U/L (ref 0–44)
AST: 21 U/L (ref 15–41)
Albumin: 3 g/dL — ABNORMAL LOW (ref 3.5–5.0)
Alkaline Phosphatase: 222 U/L — ABNORMAL HIGH (ref 38–126)
Anion gap: 20 — ABNORMAL HIGH (ref 5–15)
BUN: 42 mg/dL — ABNORMAL HIGH (ref 6–20)
CO2: 22 mmol/L (ref 22–32)
Calcium: 9.1 mg/dL (ref 8.9–10.3)
Chloride: 101 mmol/L (ref 98–111)
Creatinine, Ser: 8.85 mg/dL — ABNORMAL HIGH (ref 0.61–1.24)
GFR calc Af Amer: 7 mL/min — ABNORMAL LOW (ref >60)
GFR calc non Af Amer: 6 mL/min — ABNORMAL LOW (ref >60)
Glucose, Bld: 86 mg/dL (ref 70–99)
Potassium: 4.4 mmol/L (ref 3.5–5.1)
Sodium: 143 mmol/L (ref 135–145)
Total Bilirubin: 0.7 mg/dL (ref 0.3–1.2)
Total Protein: 7.9 g/dL (ref 6.5–8.1)

## 2019-11-28 LAB — CBC WITH DIFFERENTIAL/PLATELET
Abs Immature Granulocytes: 0.02 10*3/uL (ref 0.00–0.07)
Basophils Absolute: 0 10*3/uL (ref 0.0–0.1)
Basophils Relative: 1 %
Eosinophils Absolute: 0.3 10*3/uL (ref 0.0–0.5)
Eosinophils Relative: 7 %
HCT: 29 % — ABNORMAL LOW (ref 39.0–52.0)
Hemoglobin: 9 g/dL — ABNORMAL LOW (ref 13.0–17.0)
Immature Granulocytes: 1 %
Lymphocytes Relative: 13 %
Lymphs Abs: 0.5 10*3/uL — ABNORMAL LOW (ref 0.7–4.0)
MCH: 27.9 pg (ref 26.0–34.0)
MCHC: 31 g/dL (ref 30.0–36.0)
MCV: 89.8 fL (ref 80.0–100.0)
Monocytes Absolute: 0.6 10*3/uL (ref 0.1–1.0)
Monocytes Relative: 16 %
Neutro Abs: 2.5 10*3/uL (ref 1.7–7.7)
Neutrophils Relative %: 62 %
Platelets: 121 10*3/uL — ABNORMAL LOW (ref 150–400)
RBC: 3.23 MIL/uL — ABNORMAL LOW (ref 4.22–5.81)
RDW: 17 % — ABNORMAL HIGH (ref 11.5–15.5)
WBC: 4 10*3/uL (ref 4.0–10.5)
nRBC: 0 % (ref 0.0–0.2)

## 2019-11-28 LAB — MAGNESIUM: Magnesium: 1.8 mg/dL (ref 1.7–2.4)

## 2019-11-28 LAB — LIPASE, BLOOD: Lipase: 83 U/L — ABNORMAL HIGH (ref 11–51)

## 2019-11-28 MED ORDER — ACETAMINOPHEN 325 MG PO TABS
650.00 | ORAL_TABLET | ORAL | Status: DC
Start: ? — End: 2019-11-28

## 2019-11-28 MED ORDER — PROMETHAZINE HCL 25 MG/ML IJ SOLN
12.5000 mg | Freq: Once | INTRAMUSCULAR | Status: AC
  Administered 2019-11-28: 12.5 mg via INTRAMUSCULAR
  Filled 2019-11-28: qty 1

## 2019-11-28 MED ORDER — HYDROMORPHONE HCL 1 MG/ML IJ SOLN
0.5000 mg | Freq: Once | INTRAMUSCULAR | Status: AC
  Administered 2019-11-28: 0.5 mg via INTRAMUSCULAR
  Filled 2019-11-28: qty 1

## 2019-11-28 MED ORDER — HEPARIN SODIUM (PORCINE) 10000 UNIT/ML IJ SOLN
0.50 | INTRAMUSCULAR | Status: DC
Start: ? — End: 2019-11-28

## 2019-11-28 MED ORDER — PROCHLORPERAZINE EDISYLATE 10 MG/2ML IJ SOLN
10.00 | INTRAMUSCULAR | Status: DC
Start: ? — End: 2019-11-28

## 2019-11-28 NOTE — ED Triage Notes (Signed)
Patient reports hx of pancreatitis, states abd pain and vomiting since Friday. Went to dialysis yesterday but did not receive full treatment due to feeling bad. Pt a/ox4, resp e/u, nad.
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
## 2019-11-28 NOTE — Discharge Instructions (Addendum)
Follow up with your PCP regarding your ED visit today Continue taking your home meds as needed Go to dialysis as scheduled for tomorrow

## 2019-11-28 NOTE — ED Notes (Signed)
Patient was rude and demanding upon NT getting temp.

## 2019-11-28 NOTE — ED Notes (Signed)
Pt refusing to let RN draw blood or get an EKG

## 2019-12-01 ENCOUNTER — Other Ambulatory Visit: Payer: Self-pay

## 2019-12-01 ENCOUNTER — Emergency Department (HOSPITAL_COMMUNITY): Payer: Medicare Other

## 2019-12-01 ENCOUNTER — Emergency Department (HOSPITAL_COMMUNITY)
Admission: EM | Admit: 2019-12-01 | Discharge: 2019-12-01 | Disposition: A | Discharge status: Home / Self Care | Payer: Medicare Other | Attending: Emergency Medicine | Admitting: Emergency Medicine

## 2019-12-01 DIAGNOSIS — Z87891 Personal history of nicotine dependence: Secondary | ICD-10-CM | POA: Diagnosis not present

## 2019-12-01 DIAGNOSIS — Z86711 Personal history of pulmonary embolism: Secondary | ICD-10-CM | POA: Insufficient documentation

## 2019-12-01 DIAGNOSIS — N186 End stage renal disease: Secondary | ICD-10-CM | POA: Diagnosis not present

## 2019-12-01 DIAGNOSIS — Z86718 Personal history of other venous thrombosis and embolism: Secondary | ICD-10-CM | POA: Insufficient documentation

## 2019-12-01 DIAGNOSIS — R1110 Vomiting, unspecified: Secondary | ICD-10-CM | POA: Diagnosis present

## 2019-12-01 DIAGNOSIS — I132 Hypertensive heart and chronic kidney disease with heart failure and with stage 5 chronic kidney disease, or end stage renal disease: Secondary | ICD-10-CM | POA: Insufficient documentation

## 2019-12-01 DIAGNOSIS — I5042 Chronic combined systolic (congestive) and diastolic (congestive) heart failure: Secondary | ICD-10-CM | POA: Diagnosis not present

## 2019-12-01 DIAGNOSIS — R112 Nausea with vomiting, unspecified: Secondary | ICD-10-CM | POA: Insufficient documentation

## 2019-12-01 DIAGNOSIS — E1122 Type 2 diabetes mellitus with diabetic chronic kidney disease: Secondary | ICD-10-CM | POA: Insufficient documentation

## 2019-12-01 DIAGNOSIS — Z79899 Other long term (current) drug therapy: Secondary | ICD-10-CM | POA: Diagnosis not present

## 2019-12-01 MED ORDER — PROCHLORPERAZINE EDISYLATE 10 MG/2ML IJ SOLN
10.0000 mg | Freq: Once | INTRAMUSCULAR | Status: AC
  Administered 2019-12-01: 10 mg via INTRAMUSCULAR
  Filled 2019-12-01: qty 2

## 2019-12-01 MED ORDER — OXYCODONE HCL 5 MG PO TABS
10.0000 mg | ORAL_TABLET | Freq: Once | ORAL | Status: DC
  Filled 2019-12-01: qty 2

## 2019-12-01 NOTE — ED Notes (Signed)
Pt stated that he wanted to wait to see if the antiemetic is effective before taking pain medication  beforer going to CT.

## 2019-12-01 NOTE — ED Notes (Signed)
Upon checking in pt started to be rude to this NT prior to allowing me to speak. Pt stated that he was just here and the staff was very rude to him. This NT stated that I was here to take him back to a room and had not had the chance to say anything, pt stated "you don't have to", this NT informed the pt that we are here to help him and he has to allow [place] to do so without talking to [place] in this manner. Pt apologized and was wheeled back to room.

## 2019-12-01 NOTE — ED Notes (Signed)
It should be noted that BP cuff is on lower extremity.

## 2019-12-01 NOTE — ED Triage Notes (Signed)
Correction disregard comment about missing dialysis on Wednesday. Only did not complete on Monday, but is due dialysis later today.

## 2019-12-01 NOTE — ED Notes (Signed)
Pt unhooked himself from monitor and came to the door in the hallway. He began to talk loudly about the NT who checked him in triage up front.   I explained to him that he needed to hold his voice down because there were other Pt's he is disturbing.I explained that we were not going to discuss the NT at this time that we needed to take care of him first.  I asked him to step back in the room.   He then stated that he wanted to be left alone to stand in the hallway. I explained to Mr. Pracht that due to covid he needed to remain in the room or leave if he did not comply with the rule.   I explained to Mr. Laymon that if I gave him respect,then he should give me respect as well. He became loud and told me to get out of his room. Security was called.  Upon discharge he refused to allow me to read his discharge and he refused the tech to take vitals.

## 2019-12-01 NOTE — ED Notes (Signed)
Pt still does not want to take PO pain medication even though the antiemetic has had time to work.   Pt. Also refusing to go to CT until pain medication is given.

## 2019-12-01 NOTE — ED Notes (Signed)
This tech attempted to assess vitals for discharge, pt refused. Pt also refused to allow nurse to go over discharge paperwork. Pt appears agitated.
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
## 2019-12-01 NOTE — ED Triage Notes (Signed)
Pt stated that he has been throwing up for the past 3 days. Pt is a dialyses pt. And did no complete treatment on Monday and  Missed Wednesday.Pt is also complains of SOB, with pain in his stomach that radiates to his lungs.

## 2019-12-08 ENCOUNTER — Emergency Department (HOSPITAL_COMMUNITY)
Admission: EM | Admit: 2019-12-08 | Discharge: 2019-12-08 | Disposition: A | Discharge status: Home / Self Care | Payer: Medicare Other | Attending: Emergency Medicine | Admitting: Emergency Medicine

## 2019-12-08 ENCOUNTER — Encounter (HOSPITAL_COMMUNITY): Payer: Self-pay

## 2019-12-08 DIAGNOSIS — Z5321 Procedure and treatment not carried out due to patient leaving prior to being seen by health care provider: Secondary | ICD-10-CM | POA: Diagnosis not present

## 2019-12-08 DIAGNOSIS — R531 Weakness: Secondary | ICD-10-CM | POA: Insufficient documentation

## 2019-12-08 MED ORDER — SODIUM POLYSTYRENE SULFONATE PO POWD
30.00 | ORAL | Status: DC
Start: ? — End: 2019-12-08

## 2019-12-08 MED ORDER — HEPARIN SODIUM (PORCINE) 10000 UNIT/ML IJ SOLN
0.50 | INTRAMUSCULAR | Status: DC
Start: ? — End: 2019-12-08
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
## 2019-12-10 IMAGING — DX DG CHEST 1V PORT
1 series · 1 of 1 positions shown · non-contrast
Comparison: Chest x-ray of July 21, 2017 included with a acute
abdominal series. PA and lateral chest x-ray June 29, 2017.

CLINICAL DATA: Shortness of breath. History of CHF, dialysis
dependent renal failure, nonischemic cardiomyopathy.

EXAM:
PORTABLE CHEST 1 VIEW

[chest]
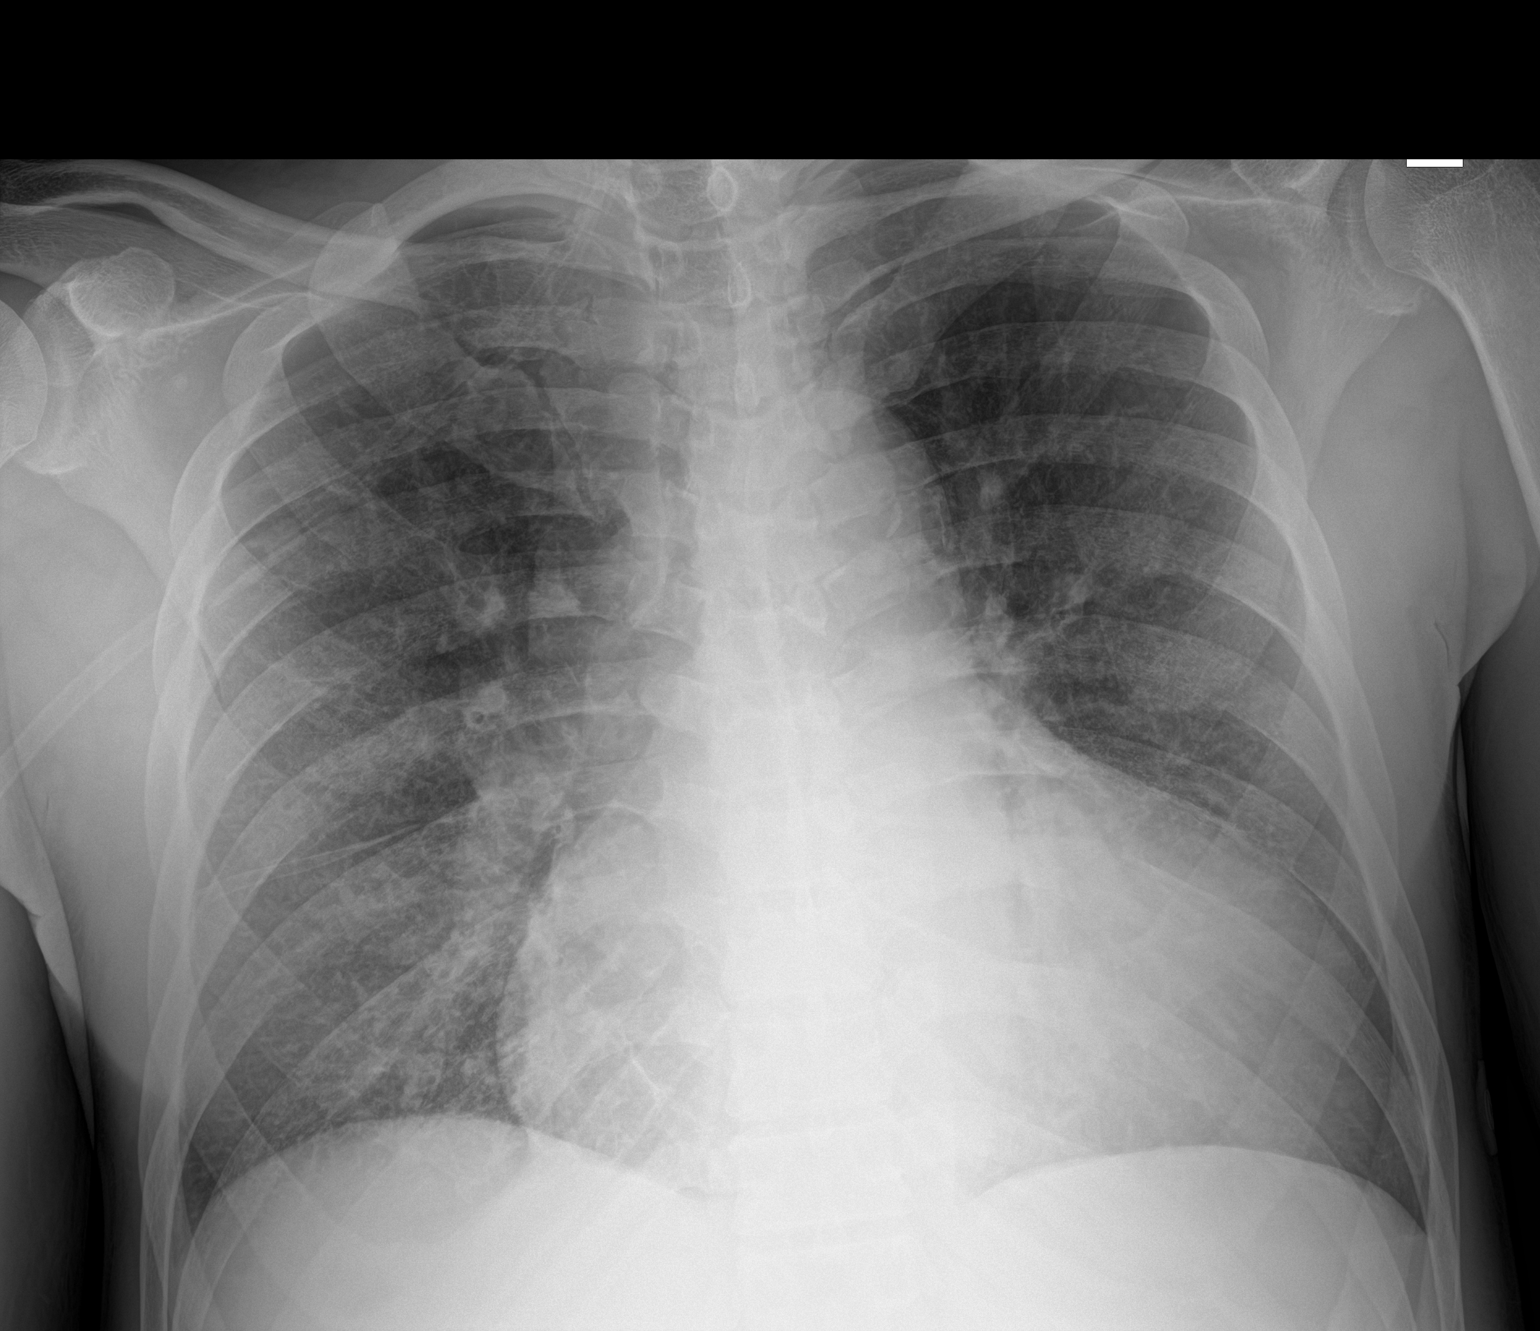

[1 of 1 positions shown; findings below may reference images not displayed]

FINDINGS: The lungs are well-expanded. The interstitial markings are
increased. The pulmonary vascularity is engorged. The cardiac
silhouette is enlarged. There is calcification in the wall of the
aortic arch. The mediastinum is normal in width. There is no pleural
effusion. The bony thorax is unremarkable.
IMPRESSION: CHF with mild interstitial edema not greatly changed from earlier
today nor from the June 29, 2017 chest x-ray.

Thoracic aortic atherosclerosis.

## 2019-12-12 ENCOUNTER — Emergency Department (HOSPITAL_COMMUNITY)
Admission: EM | Admit: 2019-12-12 | Discharge: 2019-12-13 | Disposition: A | Discharge status: Home / Self Care | Payer: Medicare Other | Attending: Emergency Medicine | Admitting: Emergency Medicine

## 2019-12-12 ENCOUNTER — Other Ambulatory Visit: Payer: Self-pay

## 2019-12-12 DIAGNOSIS — Z79899 Other long term (current) drug therapy: Secondary | ICD-10-CM | POA: Diagnosis not present

## 2019-12-12 DIAGNOSIS — E1122 Type 2 diabetes mellitus with diabetic chronic kidney disease: Secondary | ICD-10-CM | POA: Insufficient documentation

## 2019-12-12 DIAGNOSIS — R1013 Epigastric pain: Secondary | ICD-10-CM | POA: Insufficient documentation

## 2019-12-12 DIAGNOSIS — Z7901 Long term (current) use of anticoagulants: Secondary | ICD-10-CM | POA: Diagnosis not present

## 2019-12-12 DIAGNOSIS — N186 End stage renal disease: Secondary | ICD-10-CM | POA: Insufficient documentation

## 2019-12-12 DIAGNOSIS — R1110 Vomiting, unspecified: Secondary | ICD-10-CM | POA: Insufficient documentation

## 2019-12-12 DIAGNOSIS — I5042 Chronic combined systolic (congestive) and diastolic (congestive) heart failure: Secondary | ICD-10-CM | POA: Insufficient documentation

## 2019-12-12 DIAGNOSIS — Z992 Dependence on renal dialysis: Secondary | ICD-10-CM | POA: Diagnosis not present

## 2019-12-12 DIAGNOSIS — R109 Unspecified abdominal pain: Secondary | ICD-10-CM

## 2019-12-12 DIAGNOSIS — K861 Other chronic pancreatitis: Secondary | ICD-10-CM | POA: Insufficient documentation

## 2019-12-12 DIAGNOSIS — I132 Hypertensive heart and chronic kidney disease with heart failure and with stage 5 chronic kidney disease, or end stage renal disease: Secondary | ICD-10-CM | POA: Insufficient documentation

## 2019-12-12 DIAGNOSIS — Z7984 Long term (current) use of oral hypoglycemic drugs: Secondary | ICD-10-CM | POA: Insufficient documentation

## 2019-12-12 DIAGNOSIS — R0602 Shortness of breath: Secondary | ICD-10-CM | POA: Diagnosis not present

## 2019-12-12 DIAGNOSIS — Z940 Kidney transplant status: Secondary | ICD-10-CM | POA: Diagnosis not present

## 2019-12-12 MED ORDER — SODIUM CHLORIDE 0.9% FLUSH: 3.0000 mL | Freq: Once | INTRAVENOUS | Status: DC

## 2019-12-12 NOTE — ED Notes (Signed)
Pt is refusing ekg and labs.

## 2019-12-12 NOTE — ED Triage Notes (Signed)
Per pt sob bc missed dialysis on Friday. Pt said also having pancreatitis flare up per. Pt said vomiting today no diarrhea. No fevers.

## 2019-12-12 NOTE — ED Notes (Signed)
Attempted to get temp on pt x4 but pt refused.

## 2019-12-13 ENCOUNTER — Emergency Department (HOSPITAL_COMMUNITY): Payer: Medicare Other

## 2019-12-13 LAB — POCT I-STAT EG7
Acid-base deficit: 7 mmol/L — ABNORMAL HIGH (ref 0.0–2.0)
Bicarbonate: 19.2 mmol/L — ABNORMAL LOW (ref 20.0–28.0)
Calcium, Ion: 1 mmol/L — ABNORMAL LOW (ref 1.15–1.40)
HCT: 32 % — ABNORMAL LOW (ref 39.0–52.0)
Hemoglobin: 10.9 g/dL — ABNORMAL LOW (ref 13.0–17.0)
O2 Saturation: 87 %
Potassium: 5.6 mmol/L — ABNORMAL HIGH (ref 3.5–5.1)
Sodium: 137 mmol/L (ref 135–145)
TCO2: 20 mmol/L — ABNORMAL LOW (ref 22–32)
pCO2, Ven: 38.8 mmHg — ABNORMAL LOW (ref 44.0–60.0)
pH, Ven: 7.303 (ref 7.250–7.430)
pO2, Ven: 58 mmHg — ABNORMAL HIGH (ref 32.0–45.0)

## 2019-12-13 LAB — COMPREHENSIVE METABOLIC PANEL
ALT: 17 U/L (ref 0–44)
AST: 28 U/L (ref 15–41)
Albumin: 3.7 g/dL (ref 3.5–5.0)
Alkaline Phosphatase: 264 U/L — ABNORMAL HIGH (ref 38–126)
Anion gap: 25 — ABNORMAL HIGH (ref 5–15)
BUN: 90 mg/dL — ABNORMAL HIGH (ref 6–20)
CO2: 18 mmol/L — ABNORMAL LOW (ref 22–32)
Calcium: 9 mg/dL (ref 8.9–10.3)
Chloride: 97 mmol/L — ABNORMAL LOW (ref 98–111)
Creatinine, Ser: 15.71 mg/dL — ABNORMAL HIGH (ref 0.61–1.24)
GFR calc Af Amer: 3 mL/min — ABNORMAL LOW (ref >60)
GFR calc non Af Amer: 3 mL/min — ABNORMAL LOW (ref >60)
Glucose, Bld: 70 mg/dL (ref 70–99)
Potassium: 5.9 mmol/L — ABNORMAL HIGH (ref 3.5–5.1)
Sodium: 140 mmol/L (ref 135–145)
Total Bilirubin: 1.2 mg/dL (ref 0.3–1.2)
Total Protein: 9 g/dL — ABNORMAL HIGH (ref 6.5–8.1)

## 2019-12-13 LAB — CBC
HCT: 29.6 % — ABNORMAL LOW (ref 39.0–52.0)
Hemoglobin: 9.2 g/dL — ABNORMAL LOW (ref 13.0–17.0)
MCH: 27.4 pg (ref 26.0–34.0)
MCHC: 31.1 g/dL (ref 30.0–36.0)
MCV: 88.1 fL (ref 80.0–100.0)
Platelets: 154 10*3/uL (ref 150–400)
RBC: 3.36 MIL/uL — ABNORMAL LOW (ref 4.22–5.81)
RDW: 16.3 % — ABNORMAL HIGH (ref 11.5–15.5)
WBC: 4.1 10*3/uL (ref 4.0–10.5)
nRBC: 0 % (ref 0.0–0.2)

## 2019-12-13 LAB — LIPASE, BLOOD: Lipase: 34 U/L (ref 11–51)

## 2019-12-13 LAB — I-STAT CREATININE, ED: Creatinine, Ser: 16.3 mg/dL — ABNORMAL HIGH (ref 0.61–1.24)

## 2019-12-13 MED ORDER — ALUM & MAG HYDROXIDE-SIMETH 200-200-20 MG/5ML PO SUSP
30.0000 mL | Freq: Once | ORAL | Status: AC
  Administered 2019-12-13: 30 mL via ORAL
  Filled 2019-12-13: qty 30

## 2019-12-13 MED ORDER — OXYCODONE HCL 5 MG PO TABS
15.0000 mg | ORAL_TABLET | Freq: Once | ORAL | Status: DC
  Filled 2019-12-13: qty 3

## 2019-12-13 MED ORDER — OXYCODONE HCL 5 MG/5ML PO SOLN: 15.0000 mg | Freq: Once | ORAL | Status: DC

## 2019-12-13 MED ORDER — HYOSCYAMINE SULFATE 0.125 MG SL SUBL
0.2500 mg | SUBLINGUAL_TABLET | Freq: Once | SUBLINGUAL | Status: AC
  Administered 2019-12-13: 0.25 mg via SUBLINGUAL
  Filled 2019-12-13: qty 2

## 2019-12-13 MED ORDER — OXYCODONE HCL 5 MG PO TABS
15.0000 mg | ORAL_TABLET | Freq: Once | ORAL | Status: AC
  Administered 2019-12-13: 15 mg via ORAL

## 2019-12-13 MED ORDER — LIDOCAINE VISCOUS HCL 2 % MT SOLN
15.0000 mL | Freq: Once | OROMUCOSAL | Status: AC
  Administered 2019-12-13: 15 mL via ORAL
  Filled 2019-12-13: qty 15

## 2019-12-13 MED ORDER — ONDANSETRON 4 MG PO TBDP
4.0000 mg | ORAL_TABLET | Freq: Once | ORAL | Status: AC
  Administered 2019-12-13: 4 mg via ORAL
  Filled 2019-12-13: qty 1
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
## 2019-12-13 NOTE — ED Notes (Signed)
Pt. Toileted

## 2019-12-13 NOTE — Progress Notes (Signed)
RT came to room to obtain ABG. Per RN, lab already obtained blood and ABG was no longer needed. Patient requested CPAP at this time. RT set up CPAP and placed on patient with 3L O2 bled into circuit. Patient tolerating CPAP settings well at this time. RT will monitor as needed.

## 2019-12-13 NOTE — ED Notes (Signed)
Pt refused to be stuck in triage for labs. Very rude

## 2019-12-15 ENCOUNTER — Encounter (HOSPITAL_COMMUNITY): Payer: Self-pay | Admitting: Emergency Medicine

## 2019-12-15 ENCOUNTER — Encounter (HOSPITAL_COMMUNITY): Payer: Self-pay | Admitting: *Deleted

## 2019-12-15 ENCOUNTER — Emergency Department (HOSPITAL_COMMUNITY)
Admission: EM | Admit: 2019-12-15 | Discharge: 2019-12-15 | Disposition: A | Discharge status: Home / Self Care | Payer: Medicare Other | Attending: Emergency Medicine | Admitting: Emergency Medicine

## 2019-12-15 ENCOUNTER — Emergency Department (HOSPITAL_COMMUNITY)
Admission: EM | Admit: 2019-12-15 | Discharge: 2019-12-16 | Disposition: A | Discharge status: Home / Self Care | Payer: Medicare Other | Source: Home / Self Care | Attending: Emergency Medicine | Admitting: Emergency Medicine

## 2019-12-15 ENCOUNTER — Other Ambulatory Visit: Payer: Self-pay

## 2019-12-15 DIAGNOSIS — Z7984 Long term (current) use of oral hypoglycemic drugs: Secondary | ICD-10-CM | POA: Insufficient documentation

## 2019-12-15 DIAGNOSIS — I132 Hypertensive heart and chronic kidney disease with heart failure and with stage 5 chronic kidney disease, or end stage renal disease: Secondary | ICD-10-CM | POA: Insufficient documentation

## 2019-12-15 DIAGNOSIS — E1122 Type 2 diabetes mellitus with diabetic chronic kidney disease: Secondary | ICD-10-CM | POA: Insufficient documentation

## 2019-12-15 DIAGNOSIS — I5042 Chronic combined systolic (congestive) and diastolic (congestive) heart failure: Secondary | ICD-10-CM | POA: Diagnosis not present

## 2019-12-15 DIAGNOSIS — Z79899 Other long term (current) drug therapy: Secondary | ICD-10-CM | POA: Insufficient documentation

## 2019-12-15 DIAGNOSIS — Z87891 Personal history of nicotine dependence: Secondary | ICD-10-CM | POA: Insufficient documentation

## 2019-12-15 DIAGNOSIS — R197 Diarrhea, unspecified: Secondary | ICD-10-CM | POA: Insufficient documentation

## 2019-12-15 DIAGNOSIS — Z992 Dependence on renal dialysis: Secondary | ICD-10-CM | POA: Diagnosis not present

## 2019-12-15 DIAGNOSIS — R1031 Right lower quadrant pain: Secondary | ICD-10-CM | POA: Diagnosis present

## 2019-12-15 DIAGNOSIS — R112 Nausea with vomiting, unspecified: Secondary | ICD-10-CM

## 2019-12-15 DIAGNOSIS — N186 End stage renal disease: Secondary | ICD-10-CM | POA: Insufficient documentation

## 2019-12-15 DIAGNOSIS — R0602 Shortness of breath: Secondary | ICD-10-CM | POA: Insufficient documentation

## 2019-12-15 DIAGNOSIS — G8929 Other chronic pain: Secondary | ICD-10-CM

## 2019-12-15 MED ORDER — SODIUM CHLORIDE 0.9% FLUSH: 3.0000 mL | Freq: Once | INTRAVENOUS | Status: DC

## 2019-12-15 NOTE — ED Notes (Signed)
Pt wanting to leave to make it to his dialysis apt. Called cab for pt

## 2019-12-15 NOTE — ED Triage Notes (Signed)
Pt says he is having problems with his pancreatitis, having vomiting and unable to hold down his medications.

## 2019-12-15 NOTE — ED Notes (Signed)
Pt refusing to have lab drawn on triage. Very rude during assessment.

## 2019-12-15 NOTE — ED Notes (Signed)
Pt LWBS

## 2019-12-15 NOTE — ED Notes (Signed)
Pt refuses to dg chest done.

## 2019-12-15 NOTE — ED Triage Notes (Signed)
HD pt brought to ED by [facility] from home for c/o increase SOB, abd pain, with n/v/d. Last HD last Monday, do to HD today. BP 184/115, HR 70 SPO2 98 2L

## 2019-12-16 LAB — COMPREHENSIVE METABOLIC PANEL
ALT: 14 U/L (ref 0–44)
AST: 28 U/L (ref 15–41)
Albumin: 3.5 g/dL (ref 3.5–5.0)
Alkaline Phosphatase: 253 U/L — ABNORMAL HIGH (ref 38–126)
Anion gap: 21 — ABNORMAL HIGH (ref 5–15)
BUN: 47 mg/dL — ABNORMAL HIGH (ref 6–20)
CO2: 22 mmol/L (ref 22–32)
Calcium: 8.7 mg/dL — ABNORMAL LOW (ref 8.9–10.3)
Chloride: 98 mmol/L (ref 98–111)
Creatinine, Ser: 10.02 mg/dL — ABNORMAL HIGH (ref 0.61–1.24)
GFR calc Af Amer: 6 mL/min — ABNORMAL LOW (ref >60)
GFR calc non Af Amer: 5 mL/min — ABNORMAL LOW (ref >60)
Glucose, Bld: 74 mg/dL (ref 70–99)
Potassium: 4.7 mmol/L (ref 3.5–5.1)
Sodium: 141 mmol/L (ref 135–145)
Total Bilirubin: 0.9 mg/dL (ref 0.3–1.2)
Total Protein: 8.6 g/dL — ABNORMAL HIGH (ref 6.5–8.1)

## 2019-12-16 LAB — LIPASE, BLOOD: Lipase: 96 U/L — ABNORMAL HIGH (ref 11–51)

## 2019-12-16 MED ORDER — ONDANSETRON HCL 4 MG PO TABS
4.0000 mg | ORAL_TABLET | Freq: Four times a day (QID) | ORAL | 0 refills | Status: DC
Start: 2019-12-16 — End: 2020-02-12

## 2019-12-16 MED ORDER — ONDANSETRON HCL 4 MG PO TABS
4.0000 mg | ORAL_TABLET | Freq: Once | ORAL | Status: DC
  Filled 2019-12-16: qty 1

## 2019-12-16 NOTE — ED Notes (Signed)
Pt refused discharge vitals 

## 2019-12-16 NOTE — ED Notes (Signed)
Patient called this RN over to him stating he needed to lie down somewhere. Pt currently sitting in wheelchair. This RN advised patient that we are working to get him a bed and that he is welcome to lie down on one of the benches in the lobby if he feels that he needs to.

## 2019-12-16 NOTE — ED Notes (Signed)
As this NT was pushing a patient to triage for reassessment the patient appeared to fall from benches by vending machine to the floor. After this NT and phlebotomist helped the patient back up he states he was eating and fell asleep. Triage RN came over and he told triage RN he was tired of waiting and wanted to see what the floor was like. As I tried to update vitals he told me I could only get blood pressure. He refused a temp check, pulse, oxygen reading. Advised pt to sit in a chair with a back and arms to prevent further injury in case he fell again. Pt stated we are not responsible and told me to get a wheelchair and push him back by triage. After getting a wheelchair he told me to leave it there and he would get in it when he felt like it. Triage RN aware.

## 2019-12-16 NOTE — ED Notes (Signed)
Pt states he wears 2L O2 at home and he needs oxygen here since he doesn't have a cpap machine here. Applied 2L O2 per triage RN

## 2019-12-16 NOTE — ED Notes (Signed)
Pt standing up at nurses station, stating he needs treatment for his sleep apnea other than supplemental O2 that was provided to him. EDT Erin and this RN advised patient there is not further treatment we can provide him for his sleep apnea in the lobby, pt states "I have been coming here for 22 years and they have always been accommodating." staff also requested multiple times that patient have a seat due to falling earlier, pt continued to stand at nurses station and state he was trying to stay awake. Pt speaking in full sentences, resp e/u nad. Pt finally ambulatory over to bench in ED lobby to lie down.

## 2019-12-16 NOTE — Discharge Instructions (Addendum)
You were seen here for nausea vomiting and chronic abdominal pain.  You have been discharged with Zofran please take as prescribed.  I want you to follow-up with your nephrologist for further evaluation and treatment of your chronic abdominal pain.  I am also giving you a referral to community health and wellness so that you can find a primary care provider and also help manage you with your chronic pain.  I want to come back to the emergency department if you develop uncontrolled nausea, vomiting, diarrhea, shortness of breath, chest pain as he symptoms would require further evaluation and management.

## 2019-12-16 NOTE — ED Notes (Signed)
Patient now demanding that staff call respiratory for him, this RN advised patient that there is no indication  for staff to call respiratory at this time, pt states "oh, I forgot you were a doctor." pt remains in nad with resp e/u.
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
## 2019-12-16 NOTE — ED Notes (Signed)
Replaced oxygen tank. Patient states there is more we should be doing for his sleep apnea while he is in lobby. Sort RN aware. Stated that aside from supplemental oxygen he would have to wait until he gets back in a room. Patient refusing vitals at this time. States "it aint about vitals right now maam I need a room this is dangerous I could fall."

## 2019-12-16 NOTE — ED Notes (Signed)
Pt agitated about lab draw and requesting apple juice before he will allow VP.  Argumentative with lab tech but cooperates.

## 2019-12-17 ENCOUNTER — Emergency Department (HOSPITAL_BASED_OUTPATIENT_CLINIC_OR_DEPARTMENT_OTHER)
Admission: EM | Admit: 2019-12-17 | Discharge: 2019-12-17 | Disposition: A | Discharge status: Home / Self Care | Payer: Medicare Other | Attending: Emergency Medicine | Admitting: Emergency Medicine

## 2019-12-17 ENCOUNTER — Encounter (HOSPITAL_BASED_OUTPATIENT_CLINIC_OR_DEPARTMENT_OTHER): Payer: Self-pay | Admitting: *Deleted

## 2019-12-17 ENCOUNTER — Other Ambulatory Visit: Payer: Self-pay

## 2019-12-17 DIAGNOSIS — G8929 Other chronic pain: Secondary | ICD-10-CM | POA: Diagnosis not present

## 2019-12-17 DIAGNOSIS — Z86711 Personal history of pulmonary embolism: Secondary | ICD-10-CM | POA: Insufficient documentation

## 2019-12-17 DIAGNOSIS — Z992 Dependence on renal dialysis: Secondary | ICD-10-CM | POA: Diagnosis not present

## 2019-12-17 DIAGNOSIS — Z7984 Long term (current) use of oral hypoglycemic drugs: Secondary | ICD-10-CM | POA: Diagnosis not present

## 2019-12-17 DIAGNOSIS — R109 Unspecified abdominal pain: Secondary | ICD-10-CM | POA: Insufficient documentation

## 2019-12-17 DIAGNOSIS — Z86718 Personal history of other venous thrombosis and embolism: Secondary | ICD-10-CM | POA: Diagnosis not present

## 2019-12-17 DIAGNOSIS — R112 Nausea with vomiting, unspecified: Secondary | ICD-10-CM | POA: Insufficient documentation

## 2019-12-17 DIAGNOSIS — N186 End stage renal disease: Secondary | ICD-10-CM | POA: Insufficient documentation

## 2019-12-17 DIAGNOSIS — I5042 Chronic combined systolic (congestive) and diastolic (congestive) heart failure: Secondary | ICD-10-CM | POA: Insufficient documentation

## 2019-12-17 DIAGNOSIS — Z79899 Other long term (current) drug therapy: Secondary | ICD-10-CM | POA: Diagnosis not present

## 2019-12-17 DIAGNOSIS — Z886 Allergy status to analgesic agent status: Secondary | ICD-10-CM | POA: Insufficient documentation

## 2019-12-17 DIAGNOSIS — Z87891 Personal history of nicotine dependence: Secondary | ICD-10-CM | POA: Insufficient documentation

## 2019-12-17 DIAGNOSIS — I10 Essential (primary) hypertension: Secondary | ICD-10-CM

## 2019-12-17 DIAGNOSIS — I132 Hypertensive heart and chronic kidney disease with heart failure and with stage 5 chronic kidney disease, or end stage renal disease: Secondary | ICD-10-CM | POA: Insufficient documentation

## 2019-12-17 DIAGNOSIS — E1122 Type 2 diabetes mellitus with diabetic chronic kidney disease: Secondary | ICD-10-CM | POA: Diagnosis not present

## 2019-12-17 MED ORDER — PROCHLORPERAZINE EDISYLATE 10 MG/2ML IJ SOLN
10.0000 mg | Freq: Once | INTRAMUSCULAR | Status: AC
  Administered 2019-12-17: 10 mg via INTRAMUSCULAR
  Filled 2019-12-17: qty 2

## 2019-12-17 NOTE — ED Notes (Signed)
  Patient pacing in and out of room.  States he is waiting for his ride.

## 2019-12-17 NOTE — ED Notes (Signed)
  Patient stated he had to go because his ride was leaving.  Patient stated he did not want to wait for discharge papers and refused vital signs.
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
## 2019-12-17 NOTE — ED Notes (Signed)
  Patient refusing to wear monitor and stay in the bed. Patient pacing around room asking for pain medication.

## 2019-12-17 NOTE — ED Notes (Signed)
  Assisted patient to bathroom.  Got to bathroom door and patient stated he was gonna wait to use it later.  Patient escorted back to room and given warm blanket.

## 2019-12-17 NOTE — ED Notes (Signed)
  Went to assess patient and stated we needed to put in and IV and draw labs to check him out.  Patient said he doesn't get stuck anymore and pointed to a hemodialysis port on his L upper chest.  Patient stated that "everyone else" draws his labs from the HD catheter.  I informed the patient that we were not allowed to do that and I would need to stick him for the labs.  Patient refused.  Dr. [name] notified

## 2019-12-17 NOTE — ED Triage Notes (Signed)
Pt. Said to Receptionist he was here for shortness of breath in the front.  RN doing triage was made aware by Pt. He is here for abd. Pain and nausea.

## 2019-12-20 ENCOUNTER — Other Ambulatory Visit: Payer: Self-pay

## 2019-12-20 ENCOUNTER — Encounter (HOSPITAL_BASED_OUTPATIENT_CLINIC_OR_DEPARTMENT_OTHER): Payer: Self-pay | Admitting: *Deleted

## 2019-12-20 ENCOUNTER — Emergency Department (HOSPITAL_BASED_OUTPATIENT_CLINIC_OR_DEPARTMENT_OTHER)
Admission: EM | Admit: 2019-12-20 | Discharge: 2019-12-20 | Disposition: A | Discharge status: Home / Self Care | Payer: Medicare Other | Attending: Emergency Medicine | Admitting: Emergency Medicine

## 2019-12-20 DIAGNOSIS — Z992 Dependence on renal dialysis: Secondary | ICD-10-CM | POA: Diagnosis not present

## 2019-12-20 DIAGNOSIS — Z885 Allergy status to narcotic agent status: Secondary | ICD-10-CM | POA: Diagnosis not present

## 2019-12-20 DIAGNOSIS — Z7984 Long term (current) use of oral hypoglycemic drugs: Secondary | ICD-10-CM | POA: Diagnosis not present

## 2019-12-20 DIAGNOSIS — I132 Hypertensive heart and chronic kidney disease with heart failure and with stage 5 chronic kidney disease, or end stage renal disease: Secondary | ICD-10-CM | POA: Diagnosis not present

## 2019-12-20 DIAGNOSIS — Z888 Allergy status to other drugs, medicaments and biological substances status: Secondary | ICD-10-CM | POA: Diagnosis not present

## 2019-12-20 DIAGNOSIS — Z87891 Personal history of nicotine dependence: Secondary | ICD-10-CM | POA: Diagnosis not present

## 2019-12-20 DIAGNOSIS — R112 Nausea with vomiting, unspecified: Secondary | ICD-10-CM

## 2019-12-20 DIAGNOSIS — Z86711 Personal history of pulmonary embolism: Secondary | ICD-10-CM | POA: Diagnosis not present

## 2019-12-20 DIAGNOSIS — Z86718 Personal history of other venous thrombosis and embolism: Secondary | ICD-10-CM | POA: Diagnosis not present

## 2019-12-20 DIAGNOSIS — N186 End stage renal disease: Secondary | ICD-10-CM | POA: Diagnosis not present

## 2019-12-20 DIAGNOSIS — I5022 Chronic systolic (congestive) heart failure: Secondary | ICD-10-CM | POA: Insufficient documentation

## 2019-12-20 DIAGNOSIS — E1122 Type 2 diabetes mellitus with diabetic chronic kidney disease: Secondary | ICD-10-CM | POA: Insufficient documentation

## 2019-12-20 DIAGNOSIS — Z79899 Other long term (current) drug therapy: Secondary | ICD-10-CM | POA: Insufficient documentation

## 2019-12-20 LAB — COMPREHENSIVE METABOLIC PANEL
ALT: 13 U/L (ref 0–44)
AST: 23 U/L (ref 15–41)
Albumin: 3.2 g/dL — ABNORMAL LOW (ref 3.5–5.0)
Alkaline Phosphatase: 220 U/L — ABNORMAL HIGH (ref 38–126)
Anion gap: 18 — ABNORMAL HIGH (ref 5–15)
BUN: 61 mg/dL — ABNORMAL HIGH (ref 6–20)
CO2: 24 mmol/L (ref 22–32)
Calcium: 8.3 mg/dL — ABNORMAL LOW (ref 8.9–10.3)
Chloride: 96 mmol/L — ABNORMAL LOW (ref 98–111)
Creatinine, Ser: 10.36 mg/dL — ABNORMAL HIGH (ref 0.61–1.24)
GFR calc Af Amer: 6 mL/min — ABNORMAL LOW (ref >60)
GFR calc non Af Amer: 5 mL/min — ABNORMAL LOW (ref >60)
Glucose, Bld: 79 mg/dL (ref 70–99)
Potassium: 4.3 mmol/L (ref 3.5–5.1)
Sodium: 138 mmol/L (ref 135–145)
Total Bilirubin: 1 mg/dL (ref 0.3–1.2)
Total Protein: 8 g/dL (ref 6.5–8.1)

## 2019-12-20 LAB — CBC WITH DIFFERENTIAL/PLATELET
Abs Immature Granulocytes: 0.02 10*3/uL (ref 0.00–0.07)
Basophils Absolute: 0 10*3/uL (ref 0.0–0.1)
Basophils Relative: 0 %
Eosinophils Absolute: 0.2 10*3/uL (ref 0.0–0.5)
Eosinophils Relative: 5 %
HCT: 25.2 % — ABNORMAL LOW (ref 39.0–52.0)
Hemoglobin: 8.1 g/dL — ABNORMAL LOW (ref 13.0–17.0)
Immature Granulocytes: 0 %
Lymphocytes Relative: 7 %
Lymphs Abs: 0.3 10*3/uL — ABNORMAL LOW (ref 0.7–4.0)
MCH: 27.6 pg (ref 26.0–34.0)
MCHC: 32.1 g/dL (ref 30.0–36.0)
MCV: 86 fL (ref 80.0–100.0)
Monocytes Absolute: 0.5 10*3/uL (ref 0.1–1.0)
Monocytes Relative: 12 %
Neutro Abs: 3.4 10*3/uL (ref 1.7–7.7)
Neutrophils Relative %: 76 %
Platelets: 120 10*3/uL — ABNORMAL LOW (ref 150–400)
RBC: 2.93 MIL/uL — ABNORMAL LOW (ref 4.22–5.81)
RDW: 16.7 % — ABNORMAL HIGH (ref 11.5–15.5)
WBC: 4.5 10*3/uL (ref 4.0–10.5)
nRBC: 0 % (ref 0.0–0.2)

## 2019-12-20 LAB — LIPASE, BLOOD: Lipase: 25 U/L (ref 11–51)

## 2019-12-20 MED ORDER — SODIUM CHLORIDE 0.9 % IV SOLN: INTRAVENOUS | Status: DC | PRN

## 2019-12-20 MED ORDER — PROMETHAZINE HCL 25 MG/ML IJ SOLN: 25.0000 mg | Freq: Once | INTRAMUSCULAR | Status: DC

## 2019-12-20 MED ORDER — PROMETHAZINE HCL 25 MG/ML IJ SOLN
25.0000 mg | Freq: Once | INTRAMUSCULAR | Status: DC
  Filled 2019-12-20: qty 1

## 2019-12-20 MED ORDER — PROMETHAZINE HCL 25 MG/ML IJ SOLN
6.2500 mg | Freq: Once | INTRAMUSCULAR | Status: DC
  Filled 2019-12-20: qty 1

## 2019-12-20 NOTE — ED Notes (Signed)
Pt removed monitoring

## 2019-12-20 NOTE — ED Triage Notes (Addendum)
Pt arrived with c/o of abd pain that radiates into his back and sob. C/o vomiting "all day" has not taken anything for vomiting. Denies any other symptoms. Pt gets dialysis on Mon-Wed-Friday through left anterior chest dialysis cath. Pt is not being cooperative with answering triage questions at this time. Denies any covid exposure.

## 2019-12-20 NOTE — ED Notes (Addendum)
This RN spoke with Pt brother Herbie [place] who reports he will be able to come get pt at time of discharge. [phone]  Pt also reports he has dialysis apt for 1 pm today

## 2019-12-20 NOTE — ED Notes (Addendum)
RRT at bedside attempting to get blood sample via ultrasound. RN assessment deferred due to attempted blood draw.

## 2019-12-20 NOTE — ED Notes (Addendum)
ED Provider at bedside attempting [place] blood draw

## 2019-12-20 NOTE — ED Provider Notes (Signed)
7:05 AM Care assumed from Dr. Dina Rich.  At time of transfer of care, patient is waiting results of laboratory testing to determine plan of care.  Plan is to reassess the patient's labs and if they are not significantly different or worsened, plan to discharge him and allow him to go get his dialysis at noon as planned.  If he is somewhat dehydrated, he can get fluids during dialysis.  Plan of care is to not get a chest x-ray and instead focus on his labs due to his chronic nausea vomiting abdominal pain.  7:57 AM Ultrasound IV placed by me for access for the Phenergan medication as well as to get the blood.  EMERGENCY DEPARTMENT  US GUIDANCE EXAM Emergency Ultrasound:  US Guidance for Needle Guidance  INDICATIONS: Difficult vascular access Linear probe used in real-time to visualize location of needle entry through skin.   PERFORMED BY: Myself IMAGES ARCHIVED?: No LIMITATIONS:  scar tissue VIEWS USED: Transverse INTERPRETATION: Needle visualized within vein, Right arm and Needle gauge 20     9:21 AM Patient's labs have returned and are all reassuring and similar to prior.  Hemoglobin is 8.1 which is similar to what he had in the eights in the past.  No active bleeding.  No leukocytosis.  Vital signs show hypertension otherwise reassuring.  CMP shows expected elevated creatinine but normal potassium.  Lipase is not elevated.  Patient will be discharged per previous plan to go to dialysis later this morning.  Initially, plan was to give the patient Phenergan however, patient kept falling asleep and we did not feel it was safe for him to get this medication at this time.  Patient will be discharged to go to dialysis and then follow-up with PCP.  Patient understood return precautions and was discharged.    Clinical Impression: 1. Non-intractable vomiting with nausea, unspecified vomiting type     Disposition: Discharge  Condition: Good  I have discussed the results, Dx and Tx plan with  the pt(& family if present). He/she/they expressed understanding and agree(s) with the plan. Discharge instructions discussed at great length. Strict return precautions discussed and pt &/or family have verbalized understanding of the instructions. No further questions at time of discharge.    Discharge Medication List as of 12/20/2019  9:23 AM      Follow Up: [place] [id number] [phone] Schedule an appointment as soon as possible for a visit    [place] [address] [id number] [place] [facility] [place] [phone]    Go to your dialysis today          Tegeler, Gwenyth Allegra, MD 12/20/19 [phone]
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
## 2019-12-20 NOTE — Discharge Instructions (Signed)
Your laboratory testing today appeared similar and improved from prior with no significant electrolyte abnormalities.  Your potassium was normal and your creatinine was elevated as expected with your dialysis regimen.  Your lipase was normal.  We feel you are safe for discharge to follow-up and go to your dialysis session later this morning.  Please rest and stay hydrated.  If any symptoms change or worsen, return to nearest emergency department.

## 2019-12-20 NOTE — ED Notes (Addendum)
Pt family Herbie [place] made aware of pt discharge reports he will be arrive to pick up patient in about 20 minutes.   Attempted to review d/c paperwork with pt. Pt states " I don't want to hear that"

## 2019-12-20 NOTE — ED Notes (Signed)
Pt. Is notably in and out of sleep.  Has moments of snoring and then once he wakes up, calls out for the nurse. He is currently sitting in chair, refusing to sit in the bed, but audible snoring is heard, by this EMT.

## 2019-12-20 NOTE — ED Notes (Signed)
Pt sitting on side of bed falling asleep, attempted to get pt to lay down in the bed, pt refused.

## 2019-12-20 NOTE — ED Notes (Signed)
This RN attempted to look for blood draw, unable to find vein. Will ask ED MD to look via [place]. Pt pacing in room, will not allow this RN to take BP.

## 2019-12-28 IMAGING — DX DG ABDOMEN ACUTE W/ 1V CHEST
3 series · 3 of 3 positions shown · non-contrast
Comparison: Chest radiograph July 03, 2017.

CLINICAL DATA: Chest pain and abdominal pain.

EXAM:
DG ABDOMEN ACUTE W/ 1V CHEST

[chest pa]
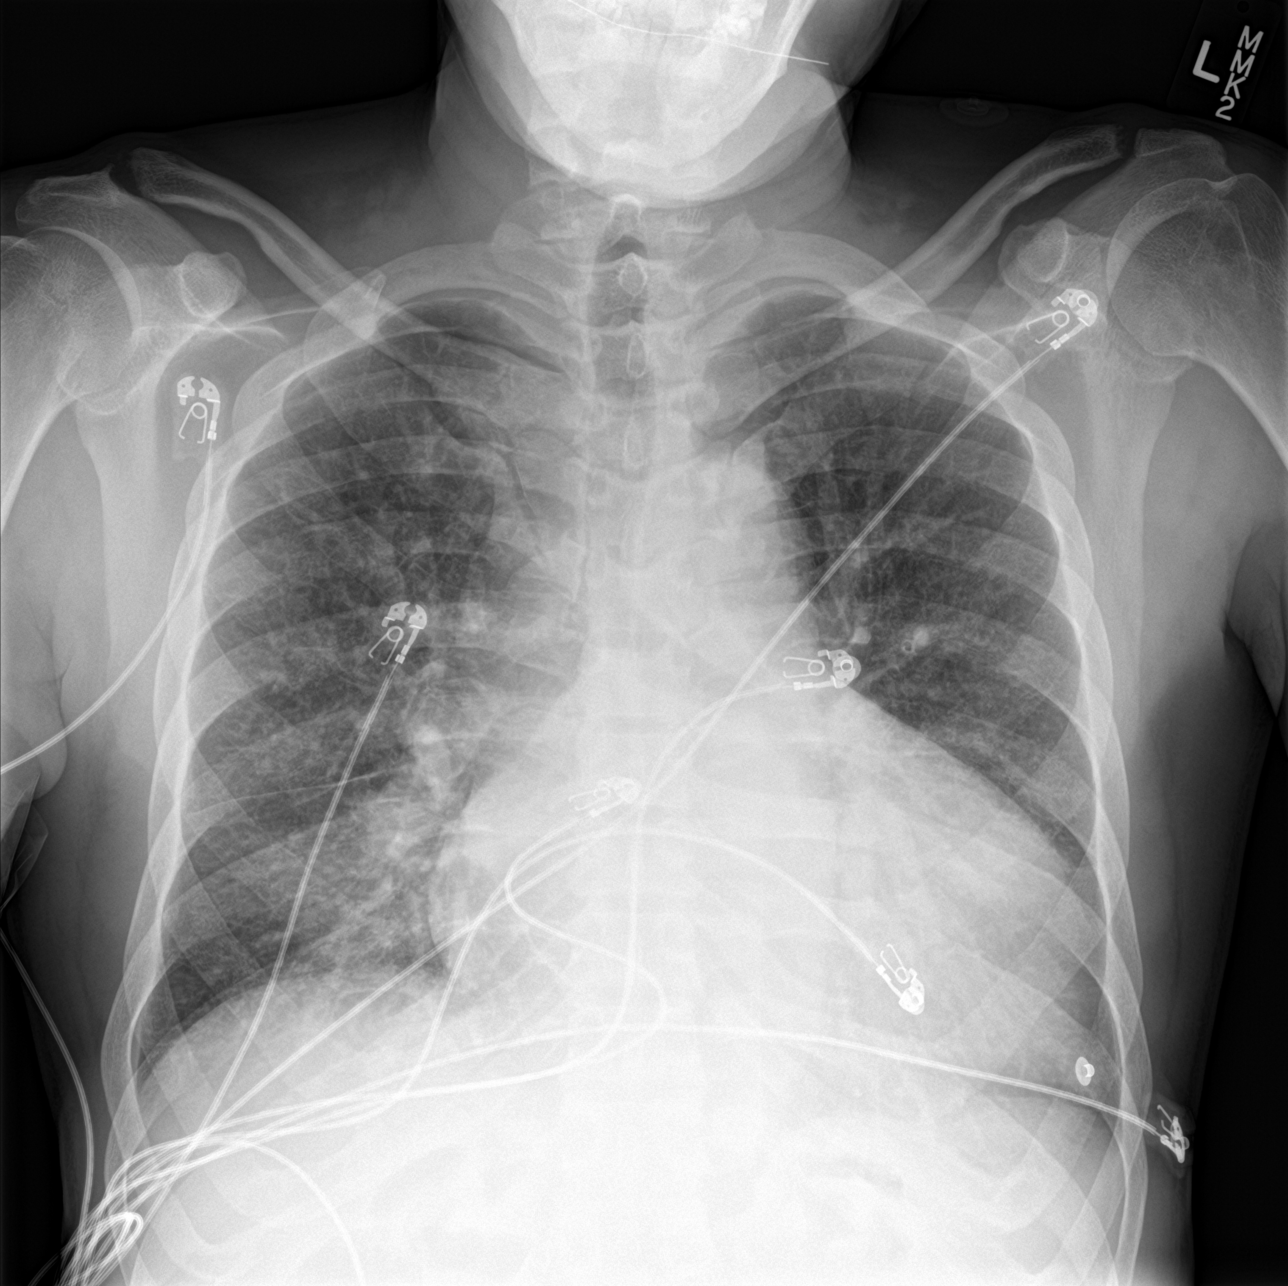

[abdomen erect]
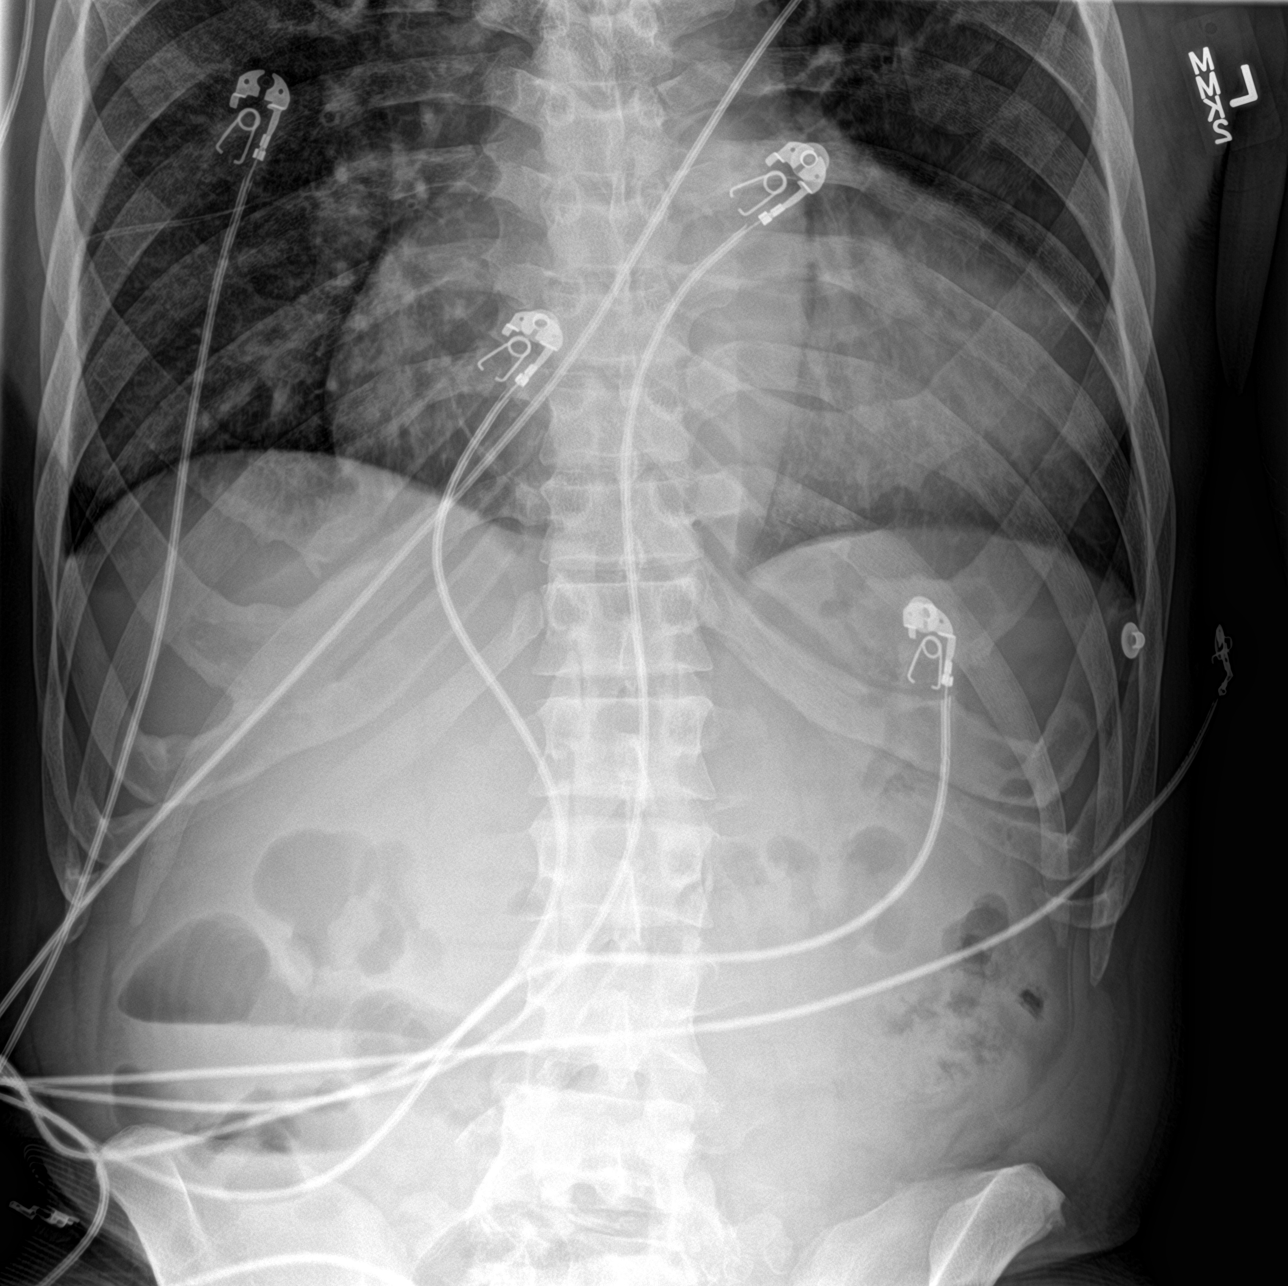

[abdomen supine]
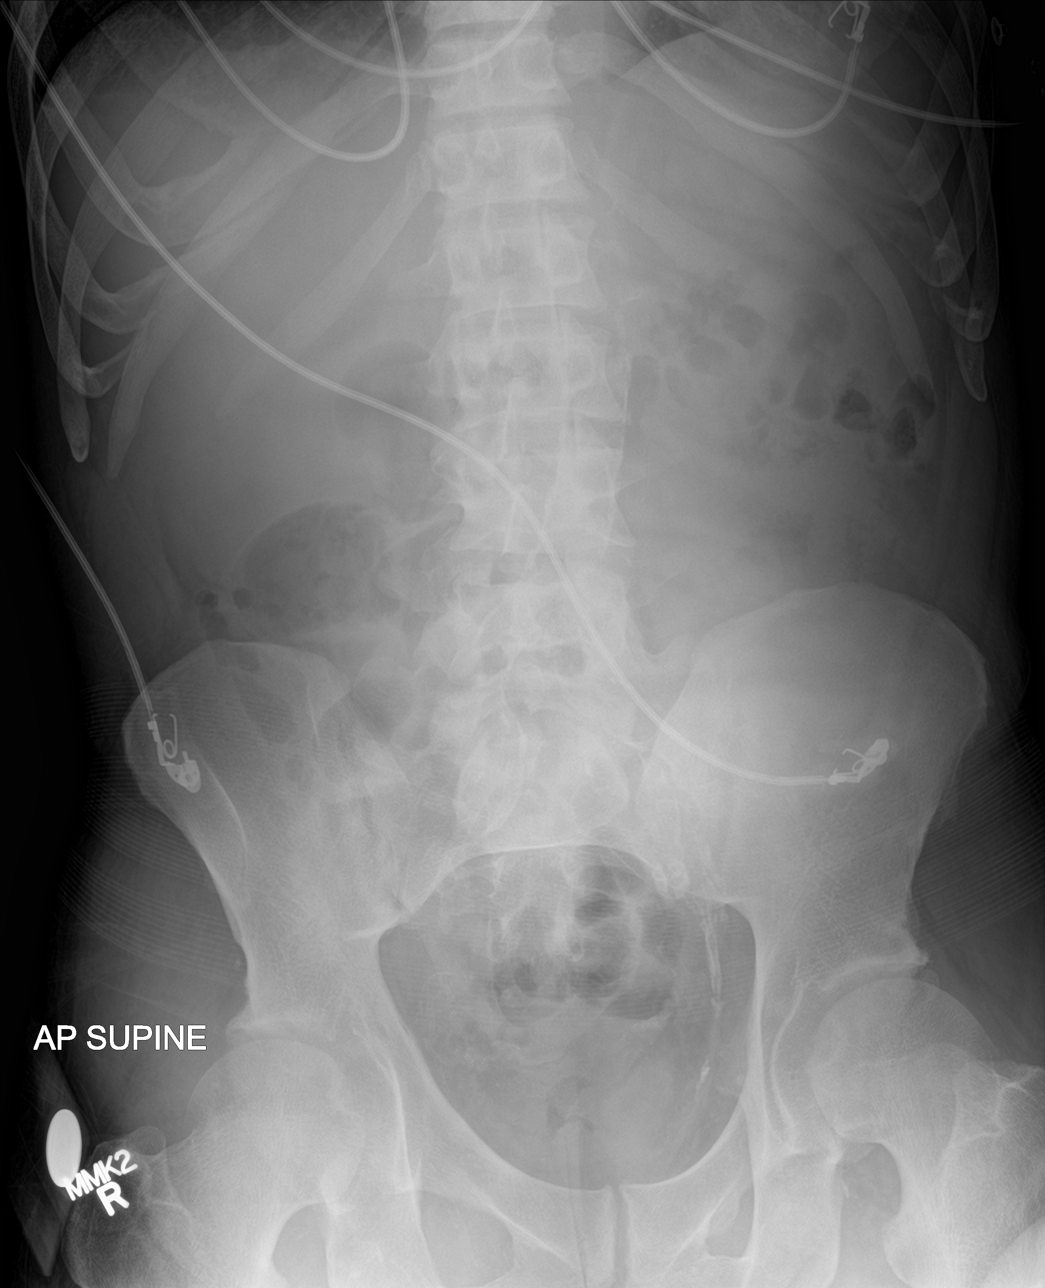

[3 of 3 positions shown; findings below may reference images not displayed]

FINDINGS: The cardiac silhouette is moderately enlarged and unchanged.
Calcified aortic knob. Pulmonary vascular congestion and diffuse
interstitial prominence without pleural effusion or focal
consolidation. No pneumothorax. Soft tissue planes and included
osseous structures are non suspicious.

Small bowel air-fluid levels RIGHT lower quadrant. No small and
large bowel distension. Severe aortoiliac vascular calcifications.
No intra-abdominal mass effect. Soft tissue planes and included
osseous structures are non suspicious.
IMPRESSION: Stable cardiomegaly and interstitial edema.

Focal ileus RIGHT abdomen, less likely early versus nonacute bowel
obstruction.

Aortic Atherosclerosis ([id number]-[id number].N).

## 2019-12-28 IMAGING — CT CT ABD-PELV W/O CM
2 of 4 series · 15 of 46 positions shown, 17 images · non-contrast
Comparison: CT the abdomen and pelvis 06/24/2017.

CLINICAL DATA: 41-year-old male with history of chest pain and
abdominal pain.

EXAM:
CT ABDOMEN AND PELVIS WITHOUT CONTRAST
TECHNIQUE: Multidetector CT imaging of the abdomen and pelvis was performed
following the standard protocol without IV contrast.

[Series 3: a/p w/o 5mm · axial · non-contrast · 0.92mm/px · z∈[+810,+1234]mm · 12 of 97 slices shown, 14 images]
[im 8/97  soft-tissue]
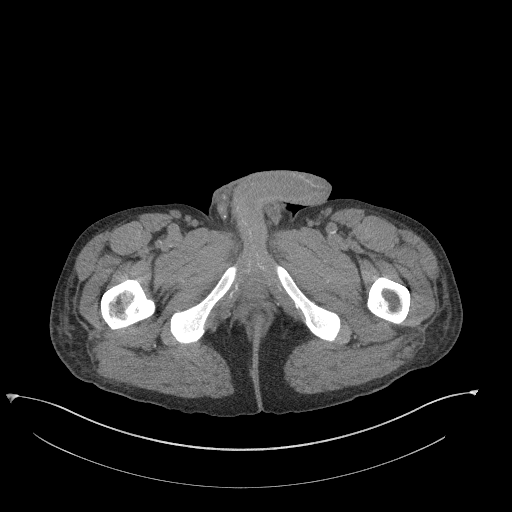
[im 8/97  bone]
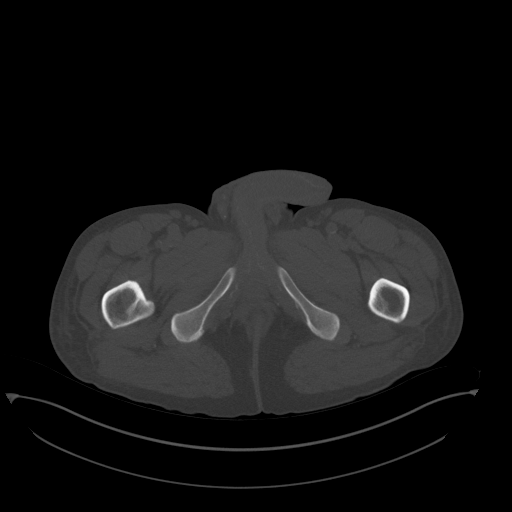
[im 16/97  soft-tissue]
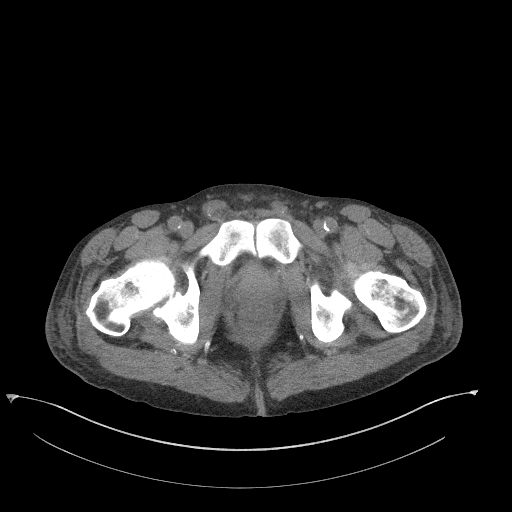
[im 24/97  soft-tissue]
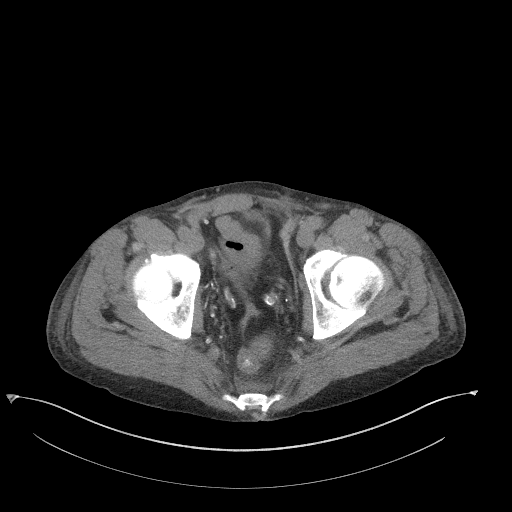
[im 31/97  soft-tissue]
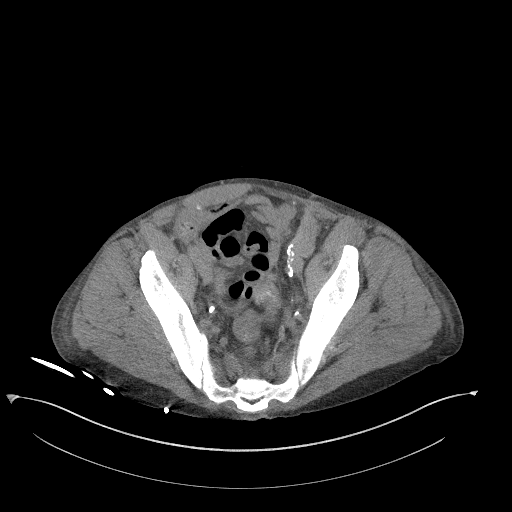
[im 39/97  soft-tissue]
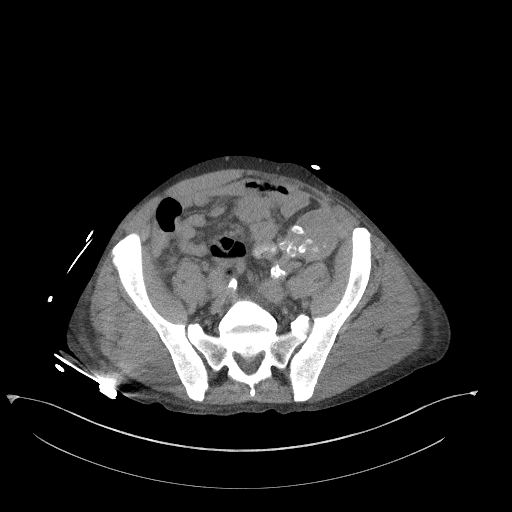
[im 47/97  soft-tissue]
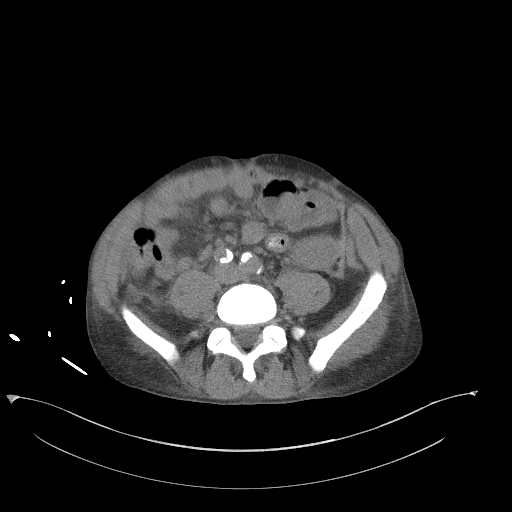
[im 54/97  soft-tissue]
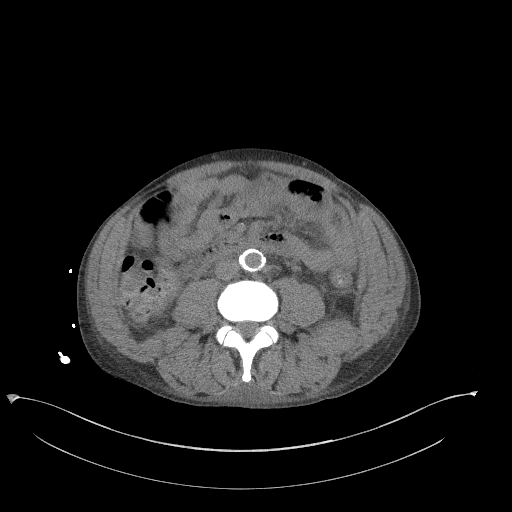
[im 62/97  soft-tissue]
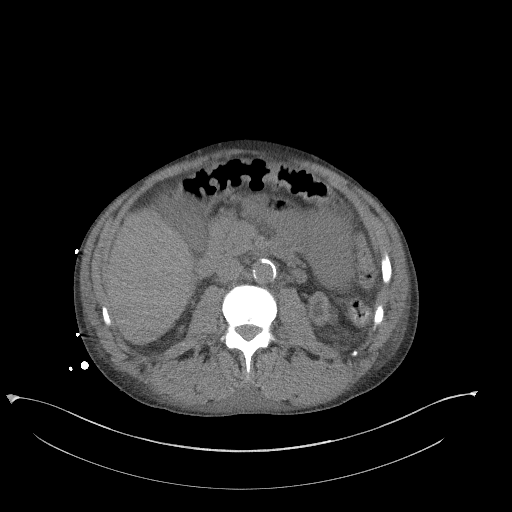
[im 70/97  soft-tissue]
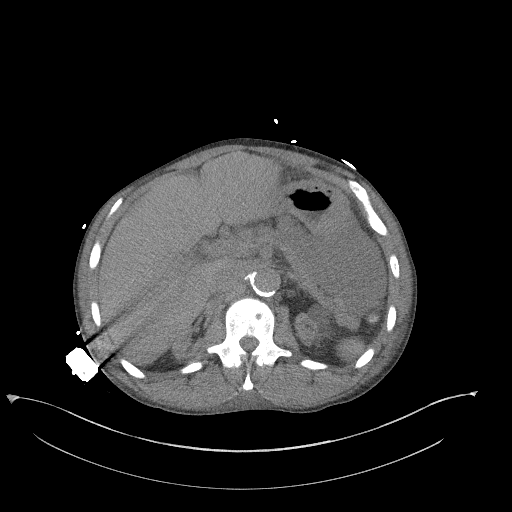
[im 70/97  bone]
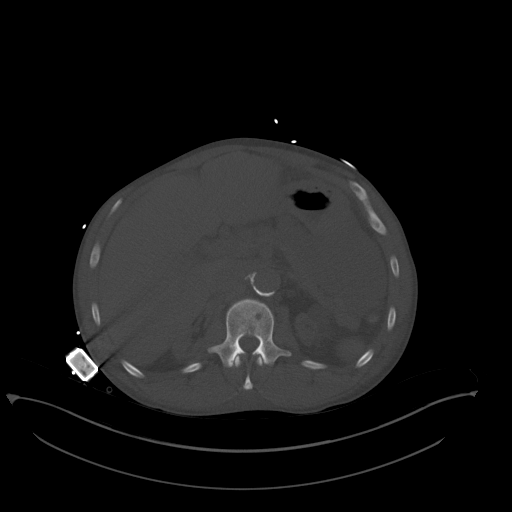
[im 77/97  soft-tissue]
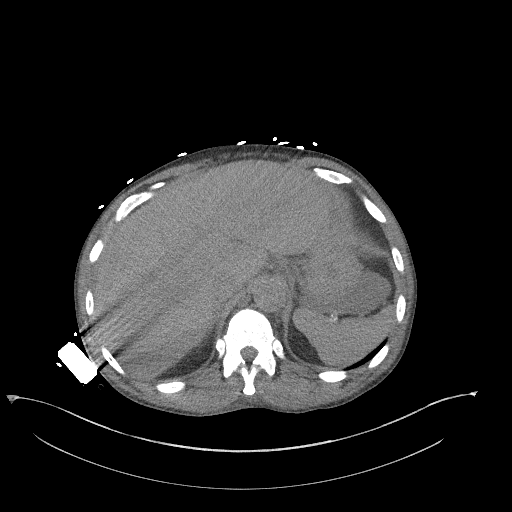
[im 85/97  soft-tissue]
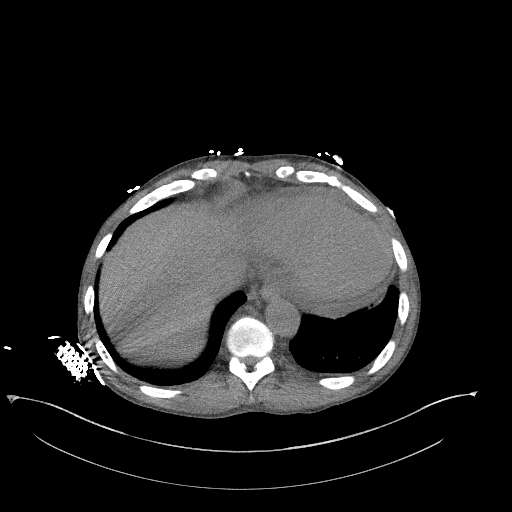
[im 93/97  soft-tissue]
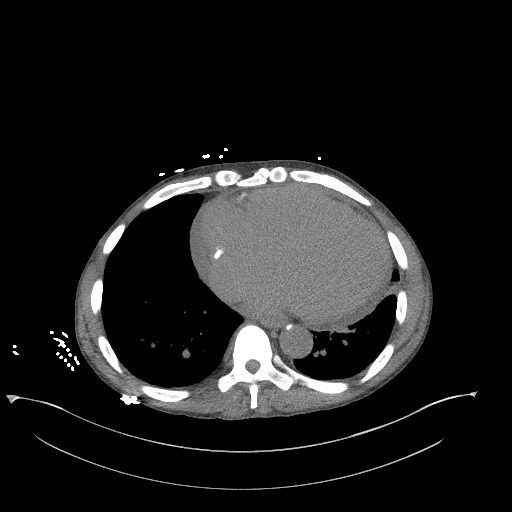

[Series 6: a/p w/o cor · coronal · non-contrast · 0.77mm/px · 3 of 144 slices shown]
[im 48/144  soft-tissue]
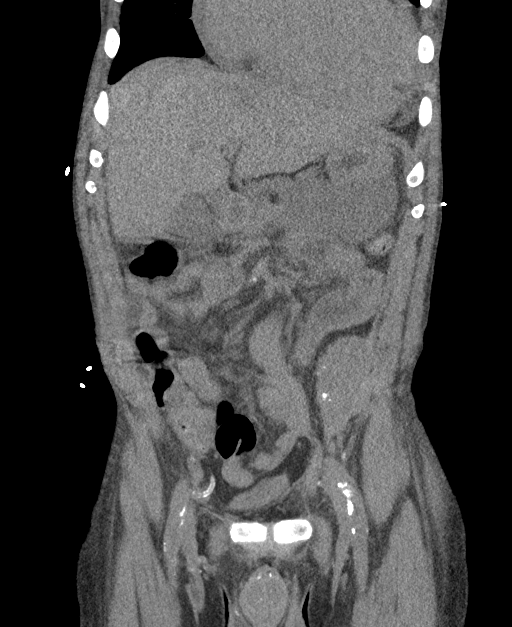
[im 64/144  soft-tissue]
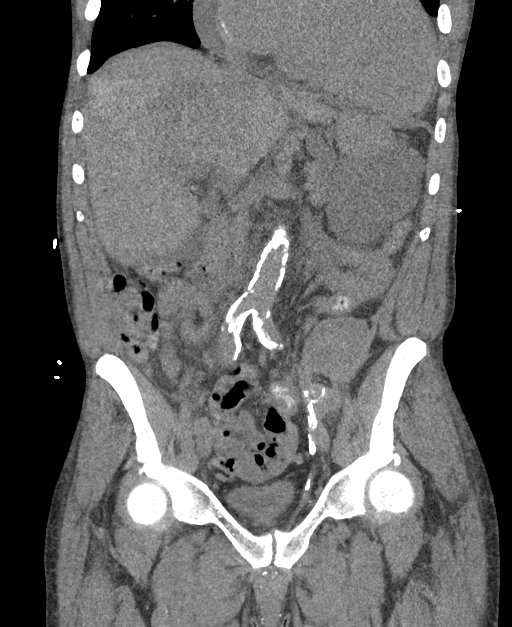
[im 80/144  soft-tissue]
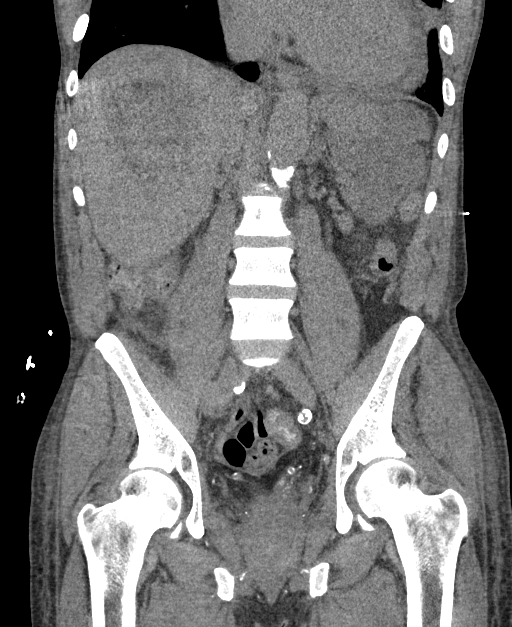

[15 of 46 positions shown; findings below may reference images not displayed]

FINDINGS: Lower chest: Cardiomegaly. Small amount of pericardial fluid and/or
thickening, unlikely to be of any hemodynamic significance at this
time. No pericardial calcifications.

Hepatobiliary: No definite cystic or solid hepatic lesions are
confidently identified on today's noncontrast CT examination.
Unenhanced appearance of the gallbladder is unremarkable.

Pancreas: No definite pancreatic mass noted on today's noncontrast
CT examination. Increasing fluid adjacent to the body and tail of
the pancreas extending into the gastrosplenic ligament, currently
measuring 8.0 x 10.7 x 7.6 cm (axial image 31 of series 3 and
coronal image 74 of series 6), potentially an enlarging pancreatic
pseudocyst.

Spleen: Unremarkable.

Adrenals/Urinary Tract: Severe atrophy of both kidneys.
Low-attenuation lesions in the left kidney, incompletely
characterized on today's noncontrast CT examination, largest of
which measures up to 3.1 cm in diameter, likely to represent cysts.
No hydroureteronephrosis. Urinary bladder is normal in appearance.
Bilateral adrenal glands are normal in appearance.

Stomach/Bowel: Normal appearance of the stomach, with exception of
some adjacent fluid in the gastrosplenic ligament (discussed above).
No pathologic dilatation of small bowel or colon. The appendix is
not confidently identified and may be surgically absent. Regardless,
there are no inflammatory changes noted adjacent to the cecum to
suggest the presence of an acute appendicitis at this time.

Vascular/Lymphatic: Aortic atherosclerosis without evidence of
aneurysm in the abdominal or pelvic vasculature. No lymphadenopathy
noted in the abdomen or pelvis.

Reproductive: Prostate gland and seminal vesicles are unremarkable
in appearance.

Other: Soft tissue mass in the left iliac fossa is presumably an old
renal transplant, with extensive calcifications in the vascular
pedicle, similar to prior studies. Trace volume of ascites. No
pneumoperitoneum.

Musculoskeletal: There are no aggressive appearing lytic or blastic
lesions noted in the visualized portions of the skeleton.
IMPRESSION: 1. Increasing fluid in the gastrosplenic ligament, suspicious for an
enlarging pancreatic pseudocyst. Clinical correlation for signs and
symptoms of pancreatitis is recommended.
2. Trace volume of ascites.
3. Cardiomegaly.
4. Aortic atherosclerosis.
5. Additional incidental findings, as above.

Aortic Atherosclerosis ([id number].M).

## 2019-12-30 ENCOUNTER — Emergency Department (HOSPITAL_COMMUNITY)
Admission: EM | Admit: 2019-12-30 | Discharge: 2019-12-30 | Disposition: A | Discharge status: Home / Self Care | Payer: Medicare Other | Attending: Emergency Medicine | Admitting: Emergency Medicine

## 2019-12-30 ENCOUNTER — Encounter (HOSPITAL_COMMUNITY): Payer: Self-pay

## 2019-12-30 ENCOUNTER — Emergency Department (HOSPITAL_COMMUNITY)
Admission: EM | Admit: 2019-12-30 | Discharge: 2019-12-30 | Disposition: A | Discharge status: Home / Self Care | Payer: Medicare Other | Source: Home / Self Care | Attending: Emergency Medicine | Admitting: Emergency Medicine

## 2019-12-30 ENCOUNTER — Emergency Department (HOSPITAL_COMMUNITY): Payer: Medicare Other

## 2019-12-30 ENCOUNTER — Encounter (HOSPITAL_COMMUNITY): Payer: Self-pay | Admitting: Emergency Medicine

## 2019-12-30 ENCOUNTER — Other Ambulatory Visit: Payer: Self-pay

## 2019-12-30 DIAGNOSIS — Z86711 Personal history of pulmonary embolism: Secondary | ICD-10-CM | POA: Insufficient documentation

## 2019-12-30 DIAGNOSIS — R1084 Generalized abdominal pain: Secondary | ICD-10-CM | POA: Diagnosis not present

## 2019-12-30 DIAGNOSIS — Z992 Dependence on renal dialysis: Secondary | ICD-10-CM | POA: Diagnosis not present

## 2019-12-30 DIAGNOSIS — R112 Nausea with vomiting, unspecified: Secondary | ICD-10-CM

## 2019-12-30 DIAGNOSIS — Z79899 Other long term (current) drug therapy: Secondary | ICD-10-CM | POA: Diagnosis not present

## 2019-12-30 DIAGNOSIS — N186 End stage renal disease: Secondary | ICD-10-CM | POA: Insufficient documentation

## 2019-12-30 DIAGNOSIS — I132 Hypertensive heart and chronic kidney disease with heart failure and with stage 5 chronic kidney disease, or end stage renal disease: Secondary | ICD-10-CM | POA: Insufficient documentation

## 2019-12-30 DIAGNOSIS — Z7901 Long term (current) use of anticoagulants: Secondary | ICD-10-CM | POA: Insufficient documentation

## 2019-12-30 DIAGNOSIS — I5022 Chronic systolic (congestive) heart failure: Secondary | ICD-10-CM | POA: Diagnosis not present

## 2019-12-30 DIAGNOSIS — E1122 Type 2 diabetes mellitus with diabetic chronic kidney disease: Secondary | ICD-10-CM | POA: Diagnosis not present

## 2019-12-30 DIAGNOSIS — Z87891 Personal history of nicotine dependence: Secondary | ICD-10-CM | POA: Insufficient documentation

## 2019-12-30 DIAGNOSIS — R55 Syncope and collapse: Secondary | ICD-10-CM

## 2019-12-30 LAB — COMPREHENSIVE METABOLIC PANEL
ALT: 19 U/L (ref 0–44)
AST: 27 U/L (ref 15–41)
Albumin: 3 g/dL — ABNORMAL LOW (ref 3.5–5.0)
Alkaline Phosphatase: 221 U/L — ABNORMAL HIGH (ref 38–126)
Anion gap: 14 (ref 5–15)
BUN: 44 mg/dL — ABNORMAL HIGH (ref 6–20)
CO2: 26 mmol/L (ref 22–32)
Calcium: 8.7 mg/dL — ABNORMAL LOW (ref 8.9–10.3)
Chloride: 99 mmol/L (ref 98–111)
Creatinine, Ser: 10.03 mg/dL — ABNORMAL HIGH (ref 0.61–1.24)
GFR calc Af Amer: 6 mL/min — ABNORMAL LOW (ref >60)
GFR calc non Af Amer: 5 mL/min — ABNORMAL LOW (ref >60)
Glucose, Bld: 71 mg/dL (ref 70–99)
Potassium: 4.3 mmol/L (ref 3.5–5.1)
Sodium: 139 mmol/L (ref 135–145)
Total Bilirubin: 0.8 mg/dL (ref 0.3–1.2)
Total Protein: 8.1 g/dL (ref 6.5–8.1)

## 2019-12-30 LAB — CBC WITH DIFFERENTIAL/PLATELET
Abs Immature Granulocytes: 0.01 10*3/uL (ref 0.00–0.07)
Basophils Absolute: 0 10*3/uL (ref 0.0–0.1)
Basophils Relative: 1 %
Eosinophils Absolute: 0.2 10*3/uL (ref 0.0–0.5)
Eosinophils Relative: 6 %
HCT: 27.3 % — ABNORMAL LOW (ref 39.0–52.0)
Hemoglobin: 8.6 g/dL — ABNORMAL LOW (ref 13.0–17.0)
Immature Granulocytes: 0 %
Lymphocytes Relative: 11 %
Lymphs Abs: 0.4 10*3/uL — ABNORMAL LOW (ref 0.7–4.0)
MCH: 28.1 pg (ref 26.0–34.0)
MCHC: 31.5 g/dL (ref 30.0–36.0)
MCV: 89.2 fL (ref 80.0–100.0)
Monocytes Absolute: 0.6 10*3/uL (ref 0.1–1.0)
Monocytes Relative: 15 %
Neutro Abs: 2.5 10*3/uL (ref 1.7–7.7)
Neutrophils Relative %: 67 %
Platelets: 106 10*3/uL — ABNORMAL LOW (ref 150–400)
RBC: 3.06 MIL/uL — ABNORMAL LOW (ref 4.22–5.81)
RDW: 16.8 % — ABNORMAL HIGH (ref 11.5–15.5)
WBC: 3.7 10*3/uL — ABNORMAL LOW (ref 4.0–10.5)
nRBC: 0 % (ref 0.0–0.2)

## 2019-12-30 LAB — LIPASE, BLOOD: Lipase: 76 U/L — ABNORMAL HIGH (ref 11–51)

## 2019-12-30 MED ORDER — CARVEDILOL 12.5 MG PO TABS
25.0000 mg | ORAL_TABLET | Freq: Two times a day (BID) | ORAL | Status: DC
  Administered 2019-12-30: 25 mg via ORAL
  Filled 2019-12-30: qty 2

## 2019-12-30 MED ORDER — DICYCLOMINE HCL 10 MG/ML IM SOLN
20.0000 mg | Freq: Once | INTRAMUSCULAR | Status: AC
  Administered 2019-12-30: 20 mg via INTRAMUSCULAR
  Filled 2019-12-30: qty 2

## 2019-12-30 MED ORDER — HYDROMORPHONE HCL 1 MG/ML IJ SOLN
1.0000 mg | Freq: Once | INTRAMUSCULAR | Status: AC
  Administered 2019-12-30: 1 mg via INTRAMUSCULAR
  Filled 2019-12-30: qty 1

## 2019-12-30 MED ORDER — HYDRALAZINE HCL 25 MG PO TABS
100.0000 mg | ORAL_TABLET | Freq: Once | ORAL | Status: AC
  Administered 2019-12-30: 100 mg via ORAL
  Filled 2019-12-30: qty 4

## 2019-12-30 MED ORDER — PROCHLORPERAZINE 25 MG RE SUPP
25.0000 mg | Freq: Two times a day (BID) | RECTAL | 0 refills | Status: DC | PRN
Start: 2019-12-30 — End: 2020-01-31

## 2019-12-30 MED ORDER — AMLODIPINE BESYLATE 5 MG PO TABS
10.0000 mg | ORAL_TABLET | Freq: Once | ORAL | Status: AC
  Administered 2019-12-30: 10 mg via ORAL
  Filled 2019-12-30: qty 2

## 2019-12-30 MED ORDER — PROCHLORPERAZINE EDISYLATE 10 MG/2ML IJ SOLN
10.0000 mg | Freq: Once | INTRAMUSCULAR | Status: AC
  Administered 2019-12-30: 10 mg via INTRAMUSCULAR
  Filled 2019-12-30: qty 2

## 2019-12-30 MED ORDER — SODIUM CHLORIDE 0.9% FLUSH: 3.0000 mL | Freq: Once | INTRAVENOUS | Status: DC

## 2019-12-30 NOTE — ED Notes (Signed)
Patient asked this nurse what we are giving him for nausea, then stated he needs a sandwich and juice. This nurse requested to wait for his medication to kick in, patient declined saying he can tolerate it

## 2019-12-30 NOTE — ED Triage Notes (Signed)
Pt reports he was discharged from [facility] today for bowel obstruction.  Reports after discharge he passed out in the car. Pt brought in by family per patient.   C/o shob, syncope, abdominal pain, and nausea.    A/ox4 Wheelchair in triage.
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
## 2019-12-30 NOTE — ED Notes (Signed)
Patient verbalizes understanding of discharge instructions. Opportunity for questioning and answers were provided. Armband removed by staff, pt discharged from ED to home with Frank [name]

## 2019-12-30 NOTE — Discharge Instructions (Addendum)
Go to dialysis tomorrow.  Pick up your prescription today for your knowledge and take your pain medicine as needed.  Make sure you always take your blood pressure medicine
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
## 2019-12-30 NOTE — ED Notes (Signed)
Patient finished eating sandwich and drink, patient provided with a wheelchair out of the department

## 2019-12-30 NOTE — ED Triage Notes (Signed)
D/c'd from [facility] yesterday with bowel obstruction-- vomiting again, abd pain.

## 2019-12-30 NOTE — ED Notes (Signed)
Pt is a hard stick and RN is aware

## 2020-01-01 ENCOUNTER — Emergency Department (HOSPITAL_COMMUNITY)
Admission: EM | Admit: 2020-01-01 | Discharge: 2020-01-01 | Disposition: A | Discharge status: Home / Self Care | Payer: Medicare Other | Attending: Emergency Medicine | Admitting: Emergency Medicine

## 2020-01-01 ENCOUNTER — Emergency Department (HOSPITAL_COMMUNITY): Payer: Medicare Other

## 2020-01-01 ENCOUNTER — Other Ambulatory Visit: Payer: Self-pay

## 2020-01-01 ENCOUNTER — Encounter (HOSPITAL_COMMUNITY): Payer: Self-pay

## 2020-01-01 DIAGNOSIS — Z992 Dependence on renal dialysis: Secondary | ICD-10-CM | POA: Insufficient documentation

## 2020-01-01 DIAGNOSIS — Z87891 Personal history of nicotine dependence: Secondary | ICD-10-CM | POA: Insufficient documentation

## 2020-01-01 DIAGNOSIS — E1122 Type 2 diabetes mellitus with diabetic chronic kidney disease: Secondary | ICD-10-CM | POA: Insufficient documentation

## 2020-01-01 DIAGNOSIS — R0602 Shortness of breath: Secondary | ICD-10-CM | POA: Diagnosis present

## 2020-01-01 DIAGNOSIS — R0600 Dyspnea, unspecified: Secondary | ICD-10-CM | POA: Diagnosis not present

## 2020-01-01 DIAGNOSIS — Z7901 Long term (current) use of anticoagulants: Secondary | ICD-10-CM | POA: Diagnosis not present

## 2020-01-01 DIAGNOSIS — I5022 Chronic systolic (congestive) heart failure: Secondary | ICD-10-CM | POA: Diagnosis not present

## 2020-01-01 DIAGNOSIS — Z7984 Long term (current) use of oral hypoglycemic drugs: Secondary | ICD-10-CM | POA: Diagnosis not present

## 2020-01-01 DIAGNOSIS — Z79899 Other long term (current) drug therapy: Secondary | ICD-10-CM | POA: Insufficient documentation

## 2020-01-01 DIAGNOSIS — N186 End stage renal disease: Secondary | ICD-10-CM | POA: Diagnosis not present

## 2020-01-01 DIAGNOSIS — I132 Hypertensive heart and chronic kidney disease with heart failure and with stage 5 chronic kidney disease, or end stage renal disease: Secondary | ICD-10-CM | POA: Diagnosis not present

## 2020-01-01 LAB — COMPREHENSIVE METABOLIC PANEL
ALT: 18 U/L (ref 0–44)
AST: 26 U/L (ref 15–41)
Albumin: 3.2 g/dL — ABNORMAL LOW (ref 3.5–5.0)
Alkaline Phosphatase: 214 U/L — ABNORMAL HIGH (ref 38–126)
Anion gap: 14 (ref 5–15)
BUN: 49 mg/dL — ABNORMAL HIGH (ref 6–20)
CO2: 26 mmol/L (ref 22–32)
Calcium: 8.8 mg/dL — ABNORMAL LOW (ref 8.9–10.3)
Chloride: 99 mmol/L (ref 98–111)
Creatinine, Ser: 10.71 mg/dL — ABNORMAL HIGH (ref 0.61–1.24)
GFR calc Af Amer: 6 mL/min — ABNORMAL LOW (ref >60)
GFR calc non Af Amer: 5 mL/min — ABNORMAL LOW (ref >60)
Glucose, Bld: 77 mg/dL (ref 70–99)
Potassium: 5.1 mmol/L (ref 3.5–5.1)
Sodium: 139 mmol/L (ref 135–145)
Total Bilirubin: 1 mg/dL (ref 0.3–1.2)
Total Protein: 8 g/dL (ref 6.5–8.1)

## 2020-01-01 LAB — CBC
HCT: 25.8 % — ABNORMAL LOW (ref 39.0–52.0)
Hemoglobin: 8.2 g/dL — ABNORMAL LOW (ref 13.0–17.0)
MCH: 27.6 pg (ref 26.0–34.0)
MCHC: 31.8 g/dL (ref 30.0–36.0)
MCV: 86.9 fL (ref 80.0–100.0)
Platelets: 123 10*3/uL — ABNORMAL LOW (ref 150–400)
RBC: 2.97 MIL/uL — ABNORMAL LOW (ref 4.22–5.81)
RDW: 16.8 % — ABNORMAL HIGH (ref 11.5–15.5)
WBC: 4 10*3/uL (ref 4.0–10.5)
nRBC: 0 % (ref 0.0–0.2)

## 2020-01-01 LAB — CBG MONITORING, ED: Glucose-Capillary: 64 mg/dL — ABNORMAL LOW (ref 70–99)

## 2020-01-01 LAB — I-STAT CHEM 8, ED
BUN: 47 mg/dL — ABNORMAL HIGH (ref 6–20)
Calcium, Ion: 1.12 mmol/L — ABNORMAL LOW (ref 1.15–1.40)
Chloride: 101 mmol/L (ref 98–111)
Creatinine, Ser: 11.9 mg/dL — ABNORMAL HIGH (ref 0.61–1.24)
Glucose, Bld: 83 mg/dL (ref 70–99)
HCT: 28 % — ABNORMAL LOW (ref 39.0–52.0)
Hemoglobin: 9.5 g/dL — ABNORMAL LOW (ref 13.0–17.0)
Potassium: 4.9 mmol/L (ref 3.5–5.1)
Sodium: 139 mmol/L (ref 135–145)
TCO2: 28 mmol/L (ref 22–32)

## 2020-01-01 MED ORDER — LIDOCAINE-EPINEPHRINE (PF) 2 %-1:200000 IJ SOLN
INTRAMUSCULAR | Status: AC
  Filled 2020-01-01: qty 20

## 2020-01-01 MED ORDER — FENTANYL CITRATE (PF) 100 MCG/2ML IJ SOLN
50.0000 ug | Freq: Once | INTRAMUSCULAR | Status: AC
  Administered 2020-01-01: 50 ug via INTRAVENOUS
  Filled 2020-01-01: qty 2

## 2020-01-01 MED ORDER — PROCHLORPERAZINE EDISYLATE 10 MG/2ML IJ SOLN
10.0000 mg | Freq: Once | INTRAMUSCULAR | Status: AC
  Administered 2020-01-01: 10 mg via INTRAVENOUS
  Filled 2020-01-01: qty 2

## 2020-01-01 NOTE — ED Notes (Signed)
Phlebotomy called to attempt blood work on patient.

## 2020-01-01 NOTE — ED Triage Notes (Signed)
Pt reports SOB and abdominal pain. He states that it is the same thing that he had yesterday. He reports that he had his COVID vaccine earlier this week and he thinks that it is from that.

## 2020-01-01 NOTE — Discharge Instructions (Addendum)
Please take all medications as prescribed Return to dialysis as soon as possible Return to the emergency department if you are becoming more short of breath

## 2020-01-01 NOTE — ED Notes (Signed)
Patient refused chest Xray.

## 2020-01-01 NOTE — ED Notes (Signed)
Pt states that he will pass out in the lobby if he doesn't walk down to the cafeteria. He was encouraged to stay in the lobby in his chair and assured that we are working as quickly as we can and have completed his standing orders. He chose to walk to the cafeteria and said that he will return.

## 2020-01-01 NOTE — ED Notes (Signed)
Patient demanding oxygen, o2 saturations 99% room air. Attempted to get IV access, patient stated nurse was "going the wrong way" and demanded attempt be made where vein was not palpable.

## 2020-01-01 NOTE — ED Notes (Signed)
Patient called for assistance. Stated he needed to see the doctor. Patient said his blood sugar is dropping and we would not allow him to eat. Patient refused to let nurse check his blood sugar.

## 2020-01-01 NOTE — ED Notes (Signed)
Patient was yelling and rudely verbal in the ED lobby about people did not care about him. Patient was removed by security from the 6th floor prior to him returning to ED from the cafeteria. When patient was placed in room 24 he was instructed to put a gown on and stated, "I am not going to do all that." Patient then demanded a blanket. A blanket was given.

## 2020-01-01 NOTE — ED Notes (Signed)
Patient coming out of room into hallway. Educated patient it is preferred to stay in room due to infection control.
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
## 2020-01-01 NOTE — ED Notes (Signed)
IV attempts by three nurses unsuccessful. Still awaiting IV team

## 2020-01-01 NOTE — ED Notes (Signed)
Pt is at the entrance to triage, irate and stating that if he doesn't get something to eat he will pass out. In response he was told that it is preferred that he abstain from eating before he is seen by the provider and that staff will check his blood sugar if he would like. He stated that he is a diabetic and his sugar level has nothing to do with him passing out staff just does not understand what he's going through. He refused and stated that he is going to the cafeteria to get something and not to take his name out of the system.

## 2020-01-04 ENCOUNTER — Encounter (HOSPITAL_COMMUNITY): Payer: Self-pay | Admitting: Emergency Medicine

## 2020-01-04 ENCOUNTER — Other Ambulatory Visit: Payer: Self-pay

## 2020-01-04 ENCOUNTER — Emergency Department (HOSPITAL_COMMUNITY)
Admission: EM | Admit: 2020-01-04 | Discharge: 2020-01-04 | Disposition: A | Discharge status: Home / Self Care | Payer: Medicare Other | Attending: Emergency Medicine | Admitting: Emergency Medicine

## 2020-01-04 DIAGNOSIS — R1084 Generalized abdominal pain: Secondary | ICD-10-CM | POA: Insufficient documentation

## 2020-01-04 DIAGNOSIS — Z5321 Procedure and treatment not carried out due to patient leaving prior to being seen by health care provider: Secondary | ICD-10-CM | POA: Diagnosis not present

## 2020-01-04 DIAGNOSIS — R0789 Other chest pain: Secondary | ICD-10-CM | POA: Insufficient documentation

## 2020-01-04 DIAGNOSIS — R0602 Shortness of breath: Secondary | ICD-10-CM | POA: Insufficient documentation

## 2020-01-04 LAB — CBG MONITORING, ED: Glucose-Capillary: 83 mg/dL (ref 70–99)

## 2020-01-04 NOTE — ED Triage Notes (Signed)
Patient arrived with EMS from a local motel , reports SOB , generalize abdominal  pain and chest tightness this morning , no cough or fever , refused EKG and blood test at triage , argumentative with staff with staff at arrival . Hemodialysis treatment Monday/Wed/Fri.

## 2020-01-04 NOTE — ED Notes (Signed)
Pt ride will be here in a hour. Pt called the clinic from waiting area and the clinic agreed to see him at 1045 to be dialyze

## 2020-01-04 NOTE — ED Notes (Signed)
Attempted to obtain vital signs. While removing EMS's oxygen, pt became aggressive with this staff member demanding oxygen. It was explained to pt that we must obtain a baseline reading without oxygen and informed pt that his oxygen saturations were reading at 100% on room air. Pt was informed that we cannot put him on oxygen because of this. Pt then removed the pulse ox from his finger and attempted to throw it at this staff member. Pt was removed from triage and placed in the lobby. Charge nurse is aware.

## 2020-01-07 ENCOUNTER — Encounter (HOSPITAL_COMMUNITY): Payer: Self-pay | Admitting: Emergency Medicine

## 2020-01-07 ENCOUNTER — Emergency Department (HOSPITAL_COMMUNITY)
Admission: EM | Admit: 2020-01-07 | Discharge: 2020-01-07 | Discharge status: Against Medical Advice | Payer: Medicare Other | Attending: Emergency Medicine | Admitting: Emergency Medicine

## 2020-01-07 ENCOUNTER — Other Ambulatory Visit: Payer: Self-pay

## 2020-01-07 DIAGNOSIS — R109 Unspecified abdominal pain: Secondary | ICD-10-CM | POA: Diagnosis not present

## 2020-01-07 DIAGNOSIS — Z5321 Procedure and treatment not carried out due to patient leaving prior to being seen by health care provider: Secondary | ICD-10-CM | POA: Diagnosis not present

## 2020-01-07 LAB — CBG MONITORING, ED
Glucose-Capillary: 59 mg/dL — ABNORMAL LOW (ref 70–99)
Glucose-Capillary: 89 mg/dL (ref 70–99)

## 2020-01-07 NOTE — ED Notes (Signed)
Offered orange juice and patient declined. Gave patient apple juice to drink. Recheck in 30 min

## 2020-01-07 NOTE — ED Notes (Signed)
PT left AMA due to schedule appointment.

## 2020-01-07 NOTE — ED Triage Notes (Signed)
Patient is complaining of abdominal pain and swelling of both feet. Patient states he is throwing up.

## 2020-01-09 ENCOUNTER — Other Ambulatory Visit: Payer: Self-pay

## 2020-01-09 ENCOUNTER — Emergency Department (HOSPITAL_BASED_OUTPATIENT_CLINIC_OR_DEPARTMENT_OTHER): Payer: Medicare Other

## 2020-01-09 ENCOUNTER — Encounter (HOSPITAL_COMMUNITY): Payer: Self-pay | Admitting: Emergency Medicine

## 2020-01-09 ENCOUNTER — Emergency Department (HOSPITAL_COMMUNITY): Payer: Medicare Other

## 2020-01-09 ENCOUNTER — Emergency Department (HOSPITAL_COMMUNITY)
Admission: EM | Admit: 2020-01-09 | Discharge: 2020-01-09 | Disposition: A | Discharge status: Home / Self Care | Payer: Medicare Other | Attending: Emergency Medicine | Admitting: Emergency Medicine

## 2020-01-09 DIAGNOSIS — M79662 Pain in left lower leg: Secondary | ICD-10-CM | POA: Diagnosis not present

## 2020-01-09 DIAGNOSIS — Z87891 Personal history of nicotine dependence: Secondary | ICD-10-CM | POA: Diagnosis not present

## 2020-01-09 DIAGNOSIS — E119 Type 2 diabetes mellitus without complications: Secondary | ICD-10-CM | POA: Diagnosis not present

## 2020-01-09 DIAGNOSIS — I132 Hypertensive heart and chronic kidney disease with heart failure and with stage 5 chronic kidney disease, or end stage renal disease: Secondary | ICD-10-CM | POA: Insufficient documentation

## 2020-01-09 DIAGNOSIS — R0602 Shortness of breath: Secondary | ICD-10-CM | POA: Diagnosis not present

## 2020-01-09 DIAGNOSIS — I5020 Unspecified systolic (congestive) heart failure: Secondary | ICD-10-CM | POA: Diagnosis not present

## 2020-01-09 DIAGNOSIS — N186 End stage renal disease: Secondary | ICD-10-CM | POA: Diagnosis not present

## 2020-01-09 DIAGNOSIS — R609 Edema, unspecified: Secondary | ICD-10-CM

## 2020-01-09 DIAGNOSIS — R1010 Upper abdominal pain, unspecified: Secondary | ICD-10-CM | POA: Diagnosis present

## 2020-01-09 DIAGNOSIS — M79605 Pain in left leg: Secondary | ICD-10-CM

## 2020-01-09 DIAGNOSIS — R112 Nausea with vomiting, unspecified: Secondary | ICD-10-CM | POA: Insufficient documentation

## 2020-01-09 DIAGNOSIS — R1084 Generalized abdominal pain: Secondary | ICD-10-CM

## 2020-01-09 LAB — COMPREHENSIVE METABOLIC PANEL
ALT: 14 U/L (ref 0–44)
AST: 18 U/L (ref 15–41)
Albumin: 3.2 g/dL — ABNORMAL LOW (ref 3.5–5.0)
Alkaline Phosphatase: 214 U/L — ABNORMAL HIGH (ref 38–126)
Anion gap: 10 (ref 5–15)
BUN: 49 mg/dL — ABNORMAL HIGH (ref 6–20)
CO2: 26 mmol/L (ref 22–32)
Calcium: 8.2 mg/dL — ABNORMAL LOW (ref 8.9–10.3)
Chloride: 98 mmol/L (ref 98–111)
Creatinine, Ser: 10.24 mg/dL — ABNORMAL HIGH (ref 0.61–1.24)
GFR calc Af Amer: 6 mL/min — ABNORMAL LOW (ref >60)
GFR calc non Af Amer: 5 mL/min — ABNORMAL LOW (ref >60)
Glucose, Bld: 84 mg/dL (ref 70–99)
Potassium: 4.5 mmol/L (ref 3.5–5.1)
Sodium: 134 mmol/L — ABNORMAL LOW (ref 135–145)
Total Bilirubin: 1 mg/dL (ref 0.3–1.2)
Total Protein: 7.9 g/dL (ref 6.5–8.1)

## 2020-01-09 LAB — CBC WITH DIFFERENTIAL/PLATELET
Abs Immature Granulocytes: 0.02 10*3/uL (ref 0.00–0.07)
Basophils Absolute: 0 10*3/uL (ref 0.0–0.1)
Basophils Relative: 1 %
Eosinophils Absolute: 0.2 10*3/uL (ref 0.0–0.5)
Eosinophils Relative: 5 %
HCT: 24.7 % — ABNORMAL LOW (ref 39.0–52.0)
Hemoglobin: 7.9 g/dL — ABNORMAL LOW (ref 13.0–17.0)
Immature Granulocytes: 1 %
Lymphocytes Relative: 10 %
Lymphs Abs: 0.4 10*3/uL — ABNORMAL LOW (ref 0.7–4.0)
MCH: 27.8 pg (ref 26.0–34.0)
MCHC: 32 g/dL (ref 30.0–36.0)
MCV: 87 fL (ref 80.0–100.0)
Monocytes Absolute: 0.7 10*3/uL (ref 0.1–1.0)
Monocytes Relative: 17 %
Neutro Abs: 3 10*3/uL (ref 1.7–7.7)
Neutrophils Relative %: 66 %
Platelets: 149 10*3/uL — ABNORMAL LOW (ref 150–400)
RBC: 2.84 MIL/uL — ABNORMAL LOW (ref 4.22–5.81)
RDW: 17.2 % — ABNORMAL HIGH (ref 11.5–15.5)
WBC: 4.4 10*3/uL (ref 4.0–10.5)
nRBC: 0 % (ref 0.0–0.2)

## 2020-01-09 LAB — LIPASE, BLOOD: Lipase: 26 U/L (ref 11–51)

## 2020-01-09 LAB — CBG MONITORING, ED
Glucose-Capillary: 60 mg/dL — ABNORMAL LOW (ref 70–99)
Glucose-Capillary: 78 mg/dL (ref 70–99)
Glucose-Capillary: 92 mg/dL (ref 70–99)

## 2020-01-09 MED ORDER — LORAZEPAM 2 MG/ML IJ SOLN
1.0000 mg | Freq: Once | INTRAMUSCULAR | Status: AC
  Administered 2020-01-09: 1 mg via INTRAMUSCULAR
  Filled 2020-01-09: qty 1

## 2020-01-09 NOTE — Discharge Instructions (Signed)
Follow-up with your primary care doctor.  Return here as needed for any worsening symptoms.  Make sure that you get your dialysis tomorrow.

## 2020-01-09 NOTE — ED Notes (Signed)
PB crackers and milk giving to PT.

## 2020-01-09 NOTE — ED Triage Notes (Signed)
Per pt, states left foot pain and swelling-also complaining of abdominal pain, N/V-states symptoms since thursday

## 2020-01-09 NOTE — Progress Notes (Signed)
Left lower extremity venous duplex completed. Refer to "CV Proc" under chart review to view preliminary results.  01/09/2020 9:51 AM Kelby Aline., MHA, RVT, RDCS, RDMS

## 2020-01-09 NOTE — ED Notes (Signed)
PT request to sit on side of bed

## 2020-01-09 NOTE — ED Notes (Addendum)
PT request sugar check. PT CBG 60. PT refuse OJ and water. RN made aware
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
## 2020-01-10 ENCOUNTER — Other Ambulatory Visit: Payer: Self-pay

## 2020-01-10 ENCOUNTER — Encounter (HOSPITAL_COMMUNITY): Payer: Self-pay | Admitting: Emergency Medicine

## 2020-01-10 ENCOUNTER — Emergency Department (HOSPITAL_COMMUNITY)
Admission: EM | Admit: 2020-01-10 | Discharge: 2020-01-10 | Disposition: A | Discharge status: Home / Self Care | Payer: Medicare Other | Attending: Emergency Medicine | Admitting: Emergency Medicine

## 2020-01-10 DIAGNOSIS — M79605 Pain in left leg: Secondary | ICD-10-CM | POA: Diagnosis not present

## 2020-01-10 DIAGNOSIS — I5023 Acute on chronic systolic (congestive) heart failure: Secondary | ICD-10-CM | POA: Diagnosis not present

## 2020-01-10 DIAGNOSIS — N186 End stage renal disease: Secondary | ICD-10-CM | POA: Insufficient documentation

## 2020-01-10 DIAGNOSIS — Z87891 Personal history of nicotine dependence: Secondary | ICD-10-CM | POA: Insufficient documentation

## 2020-01-10 DIAGNOSIS — Z79899 Other long term (current) drug therapy: Secondary | ICD-10-CM | POA: Diagnosis not present

## 2020-01-10 DIAGNOSIS — I132 Hypertensive heart and chronic kidney disease with heart failure and with stage 5 chronic kidney disease, or end stage renal disease: Secondary | ICD-10-CM | POA: Insufficient documentation

## 2020-01-10 DIAGNOSIS — F1210 Cannabis abuse, uncomplicated: Secondary | ICD-10-CM | POA: Diagnosis not present

## 2020-01-10 DIAGNOSIS — E119 Type 2 diabetes mellitus without complications: Secondary | ICD-10-CM | POA: Insufficient documentation

## 2020-01-10 DIAGNOSIS — R109 Unspecified abdominal pain: Secondary | ICD-10-CM | POA: Insufficient documentation

## 2020-01-10 DIAGNOSIS — Z992 Dependence on renal dialysis: Secondary | ICD-10-CM | POA: Insufficient documentation

## 2020-01-10 DIAGNOSIS — Z7901 Long term (current) use of anticoagulants: Secondary | ICD-10-CM | POA: Insufficient documentation

## 2020-01-10 LAB — CBC WITH DIFFERENTIAL/PLATELET
Abs Immature Granulocytes: 0.02 10*3/uL (ref 0.00–0.07)
Basophils Absolute: 0 10*3/uL (ref 0.0–0.1)
Basophils Relative: 1 %
Eosinophils Absolute: 0.2 10*3/uL (ref 0.0–0.5)
Eosinophils Relative: 6 %
HCT: 26.3 % — ABNORMAL LOW (ref 39.0–52.0)
Hemoglobin: 8.3 g/dL — ABNORMAL LOW (ref 13.0–17.0)
Immature Granulocytes: 1 %
Lymphocytes Relative: 13 %
Lymphs Abs: 0.4 10*3/uL — ABNORMAL LOW (ref 0.7–4.0)
MCH: 27.3 pg (ref 26.0–34.0)
MCHC: 31.6 g/dL (ref 30.0–36.0)
MCV: 86.5 fL (ref 80.0–100.0)
Monocytes Absolute: 0.5 10*3/uL (ref 0.1–1.0)
Monocytes Relative: 14 %
Neutro Abs: 2.2 10*3/uL (ref 1.7–7.7)
Neutrophils Relative %: 65 %
Platelets: 138 10*3/uL — ABNORMAL LOW (ref 150–400)
RBC: 3.04 MIL/uL — ABNORMAL LOW (ref 4.22–5.81)
RDW: 17.1 % — ABNORMAL HIGH (ref 11.5–15.5)
WBC: 3.3 10*3/uL — ABNORMAL LOW (ref 4.0–10.5)
nRBC: 0 % (ref 0.0–0.2)

## 2020-01-10 LAB — COMPREHENSIVE METABOLIC PANEL
ALT: 14 U/L (ref 0–44)
AST: 19 U/L (ref 15–41)
Albumin: 3.4 g/dL — ABNORMAL LOW (ref 3.5–5.0)
Alkaline Phosphatase: 218 U/L — ABNORMAL HIGH (ref 38–126)
Anion gap: 17 — ABNORMAL HIGH (ref 5–15)
BUN: 62 mg/dL — ABNORMAL HIGH (ref 6–20)
CO2: 23 mmol/L (ref 22–32)
Calcium: 8.6 mg/dL — ABNORMAL LOW (ref 8.9–10.3)
Chloride: 98 mmol/L (ref 98–111)
Creatinine, Ser: 10.98 mg/dL — ABNORMAL HIGH (ref 0.61–1.24)
GFR calc Af Amer: 5 mL/min — ABNORMAL LOW (ref >60)
GFR calc non Af Amer: 5 mL/min — ABNORMAL LOW (ref >60)
Glucose, Bld: 91 mg/dL (ref 70–99)
Potassium: 5 mmol/L (ref 3.5–5.1)
Sodium: 138 mmol/L (ref 135–145)
Total Bilirubin: 0.8 mg/dL (ref 0.3–1.2)
Total Protein: 8.2 g/dL — ABNORMAL HIGH (ref 6.5–8.1)

## 2020-01-10 LAB — CBG MONITORING, ED: Glucose-Capillary: 68 mg/dL — ABNORMAL LOW (ref 70–99)

## 2020-01-10 LAB — LIPASE, BLOOD: Lipase: 28 U/L (ref 11–51)

## 2020-01-10 MED ORDER — ONDANSETRON HCL 4 MG/2ML IJ SOLN
4.0000 mg | Freq: Once | INTRAMUSCULAR | Status: AC
  Administered 2020-01-10: 4 mg via INTRAVENOUS
  Filled 2020-01-10: qty 2

## 2020-01-10 MED ORDER — HYDROMORPHONE HCL 1 MG/ML IJ SOLN
1.0000 mg | Freq: Once | INTRAMUSCULAR | Status: AC
  Administered 2020-01-10: 1 mg via INTRAVENOUS
  Filled 2020-01-10: qty 1

## 2020-01-10 MED ORDER — HYDROMORPHONE HCL 1 MG/ML IJ SOLN
0.5000 mg | Freq: Once | INTRAMUSCULAR | Status: AC
  Administered 2020-01-10: 0.5 mg via INTRAVENOUS
  Filled 2020-01-10: qty 1
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
## 2020-01-10 NOTE — ED Notes (Addendum)
Pt states that access in upper L chest is what is used for IV access, meds, etc. States it was used as such this morning.

## 2020-01-10 NOTE — Discharge Instructions (Addendum)
Go to dialysis as originally scheduled.  If you develop worsening pain, vomiting, fever or other new concerning symptom, recommend return to ER for reassessment.  Please follow-up with your primary doctor regarding your chronic abdominal pain.

## 2020-01-10 NOTE — ED Triage Notes (Signed)
Patient pulled up to front doors and blow horn real loud. The screener went to see what was going on. Patient told screener to bring a wheelchair to were he was going to park. Charge nurse notified. Charge went out to get patient. Patient is complaining of abdominal pain, left foot swelling and pain, vomiting, and nausea. Patient states he was here earlier and the doctor wouldn't give him anything. Patient is being rude. Patient wants oxygen and his O2 Sat is 100% on room air. Explained that he is not having any issues with his oxygen in order for the nurse to give. Patient is told he has to speak to the doctor in order for [place] to give him something for pain.

## 2020-01-13 IMAGING — DX DG CHEST 1V PORT
1 series · 1 of 1 positions shown · non-contrast
Comparison: 08/08/2017

CLINICAL DATA: Chest pain, shortness of breath

EXAM:
PORTABLE CHEST 1 VIEW

[w chest pa]
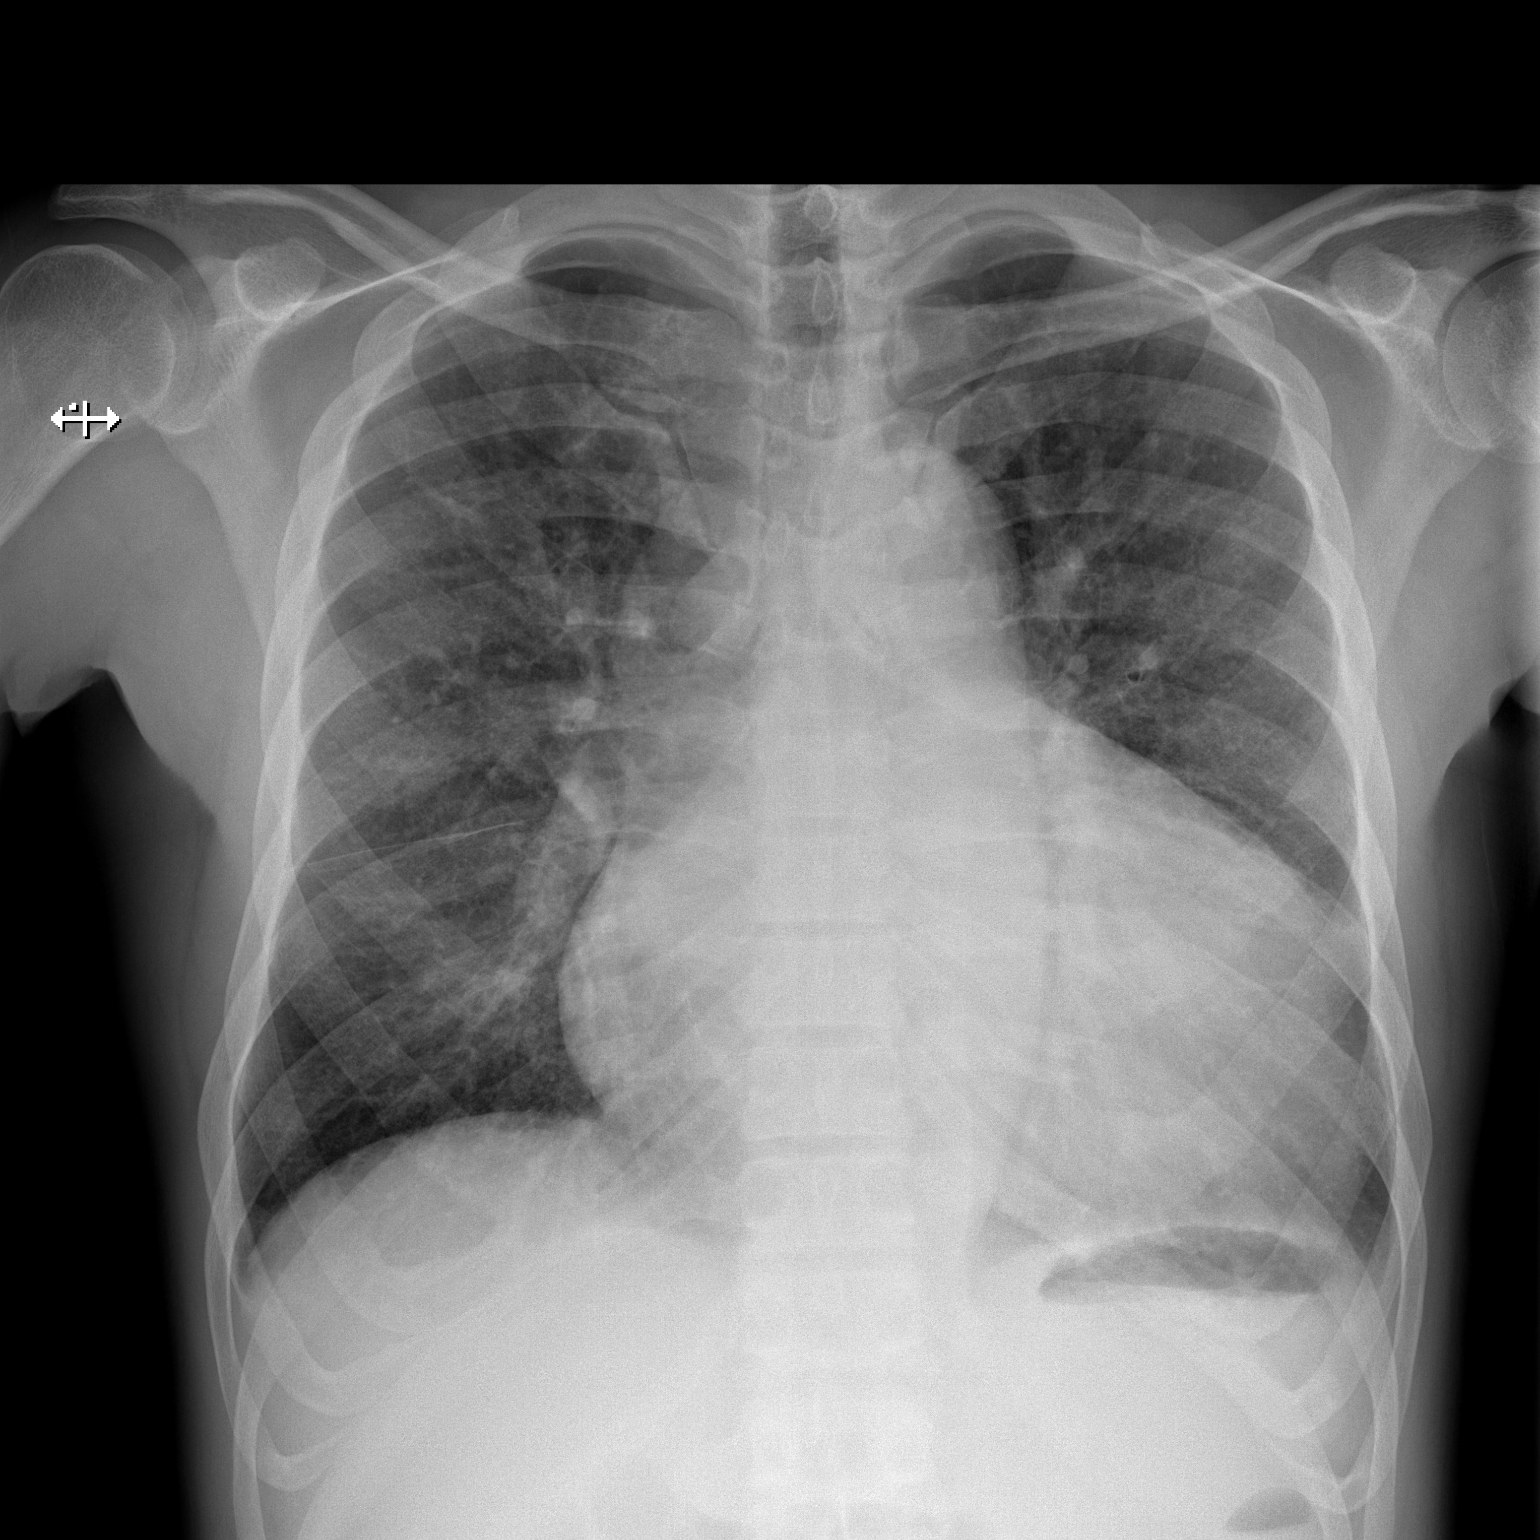

[1 of 1 positions shown; findings below may reference images not displayed]

FINDINGS: Cardiomegaly with pulmonary vascular congestion. Very mild
interstitial edema is possible, although this is improved from the
prior. Suspected trace bilateral pleural effusions. No pneumothorax.
IMPRESSION: Cardiomegaly with possible mild interstitial edema, although
improved from the prior.

Trace bilateral pleural effusions.

## 2020-01-14 ENCOUNTER — Emergency Department (HOSPITAL_COMMUNITY)
Admission: EM | Admit: 2020-01-14 | Discharge: 2020-01-14 | Disposition: A | Discharge status: Home / Self Care | Payer: Medicare Other | Attending: Emergency Medicine | Admitting: Emergency Medicine

## 2020-01-14 ENCOUNTER — Other Ambulatory Visit: Payer: Self-pay

## 2020-01-14 ENCOUNTER — Encounter (HOSPITAL_COMMUNITY): Payer: Self-pay | Admitting: Pharmacy Technician

## 2020-01-14 ENCOUNTER — Emergency Department (HOSPITAL_COMMUNITY)
Admission: EM | Admit: 2020-01-14 | Discharge: 2020-01-15 | Disposition: A | Discharge status: Home / Self Care | Payer: Medicare Other | Source: Home / Self Care | Attending: Emergency Medicine | Admitting: Emergency Medicine

## 2020-01-14 DIAGNOSIS — Z87891 Personal history of nicotine dependence: Secondary | ICD-10-CM | POA: Insufficient documentation

## 2020-01-14 DIAGNOSIS — I132 Hypertensive heart and chronic kidney disease with heart failure and with stage 5 chronic kidney disease, or end stage renal disease: Secondary | ICD-10-CM | POA: Insufficient documentation

## 2020-01-14 DIAGNOSIS — N186 End stage renal disease: Secondary | ICD-10-CM | POA: Diagnosis not present

## 2020-01-14 DIAGNOSIS — G894 Chronic pain syndrome: Secondary | ICD-10-CM

## 2020-01-14 DIAGNOSIS — Z7901 Long term (current) use of anticoagulants: Secondary | ICD-10-CM | POA: Diagnosis not present

## 2020-01-14 DIAGNOSIS — Z9861 Coronary angioplasty status: Secondary | ICD-10-CM | POA: Insufficient documentation

## 2020-01-14 DIAGNOSIS — Z992 Dependence on renal dialysis: Secondary | ICD-10-CM | POA: Insufficient documentation

## 2020-01-14 DIAGNOSIS — E1122 Type 2 diabetes mellitus with diabetic chronic kidney disease: Secondary | ICD-10-CM | POA: Diagnosis not present

## 2020-01-14 DIAGNOSIS — I160 Hypertensive urgency: Secondary | ICD-10-CM

## 2020-01-14 DIAGNOSIS — R1084 Generalized abdominal pain: Secondary | ICD-10-CM | POA: Diagnosis not present

## 2020-01-14 DIAGNOSIS — I5042 Chronic combined systolic (congestive) and diastolic (congestive) heart failure: Secondary | ICD-10-CM | POA: Diagnosis not present

## 2020-01-14 DIAGNOSIS — G8929 Other chronic pain: Secondary | ICD-10-CM | POA: Insufficient documentation

## 2020-01-14 DIAGNOSIS — Z79899 Other long term (current) drug therapy: Secondary | ICD-10-CM | POA: Insufficient documentation

## 2020-01-14 LAB — CBG MONITORING, ED: Glucose-Capillary: 86 mg/dL (ref 70–99)

## 2020-01-14 IMAGING — CT CT ABD-PELV W/O CM
2 of 4 series · 16 of 46 positions shown, 18 images · non-contrast
Comparison: CT 08/08/2017

CLINICAL DATA: Abdominal distension. Worsening abdominal pain over
the last 3 days.

EXAM:
CT ABDOMEN AND PELVIS WITHOUT CONTRAST
TECHNIQUE: Multidetector CT imaging of the abdomen and pelvis was performed
following the standard protocol without IV contrast.

[Series 3: abd/ pelvis 5.0 i30f 2 · axial · 0.78mm/px · z∈[-865,-460]mm · 13 of 89 slices shown, 15 images]
[im 4/89  soft-tissue]
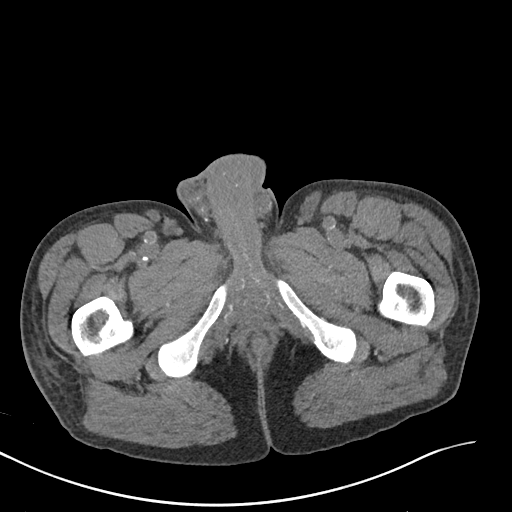
[im 4/89  bone]
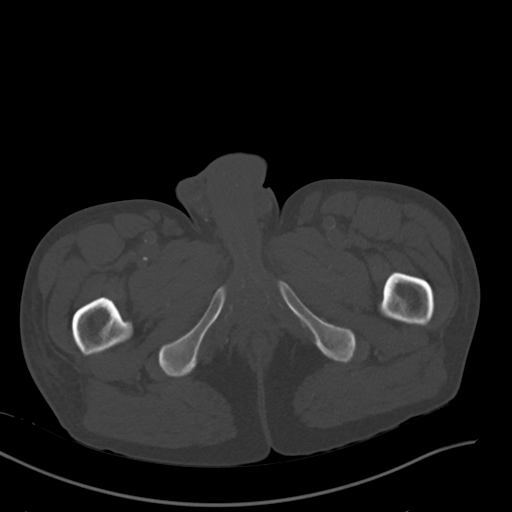
[im 11/89  soft-tissue]
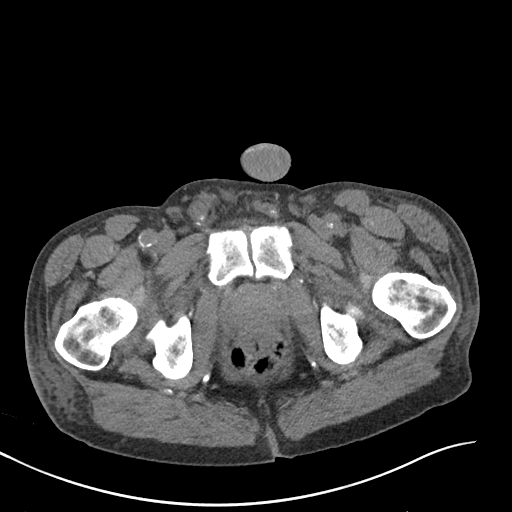
[im 17/89  soft-tissue]
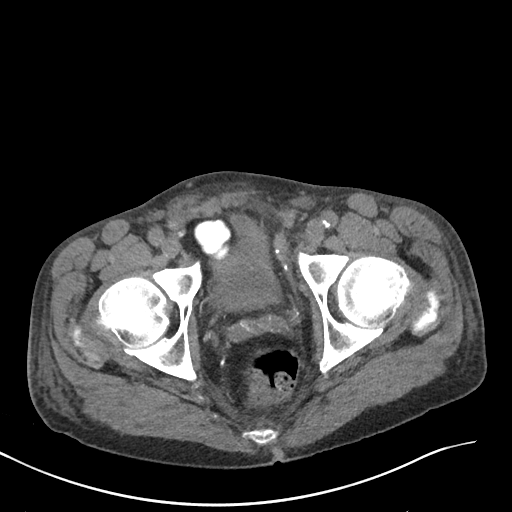
[im 24/89  soft-tissue]
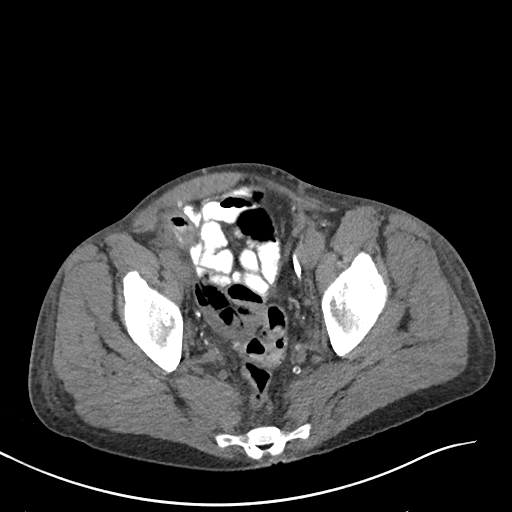
[im 31/89  soft-tissue]
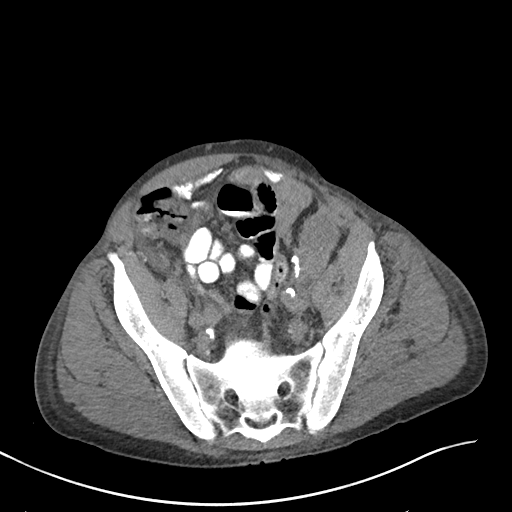
[im 38/89  soft-tissue]
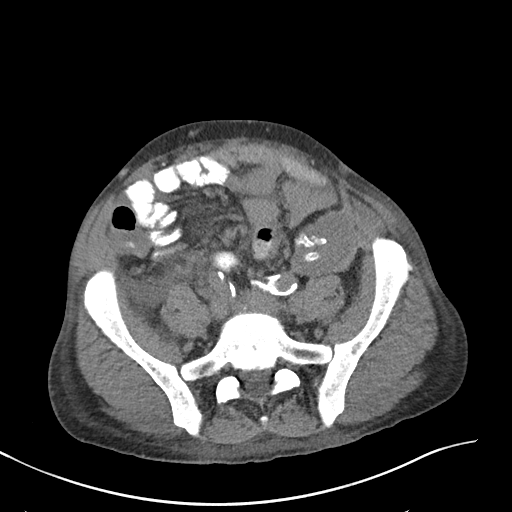
[im 45/89  soft-tissue]
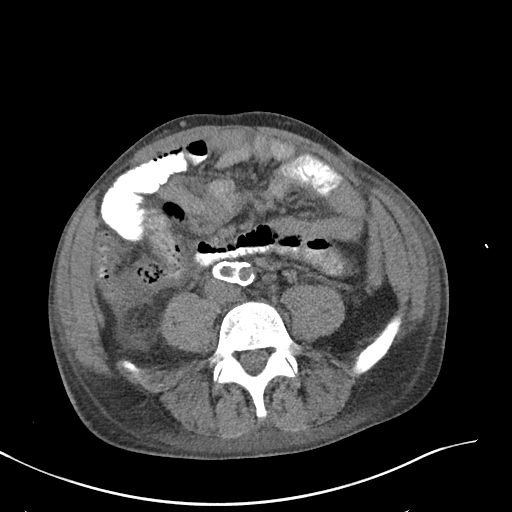
[im 51/89  soft-tissue]
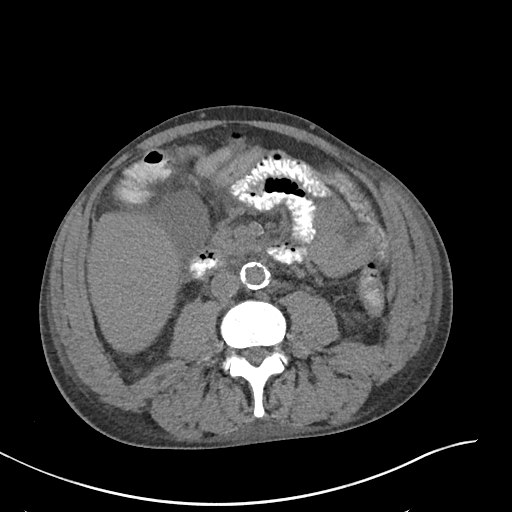
[im 58/89  soft-tissue]
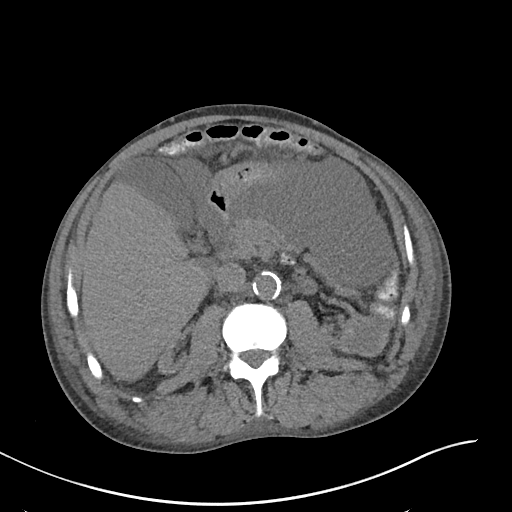
[im 58/89  bone]
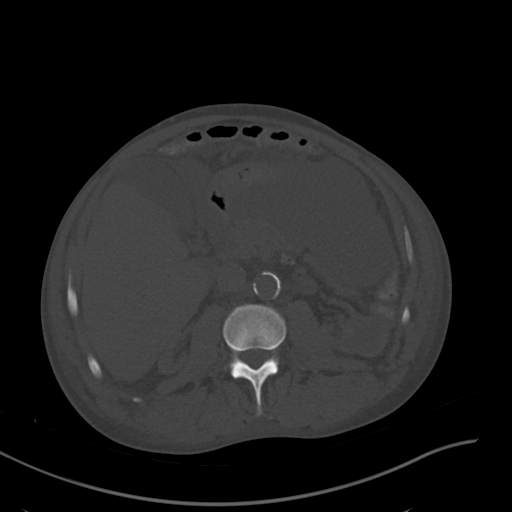
[im 65/89  soft-tissue]
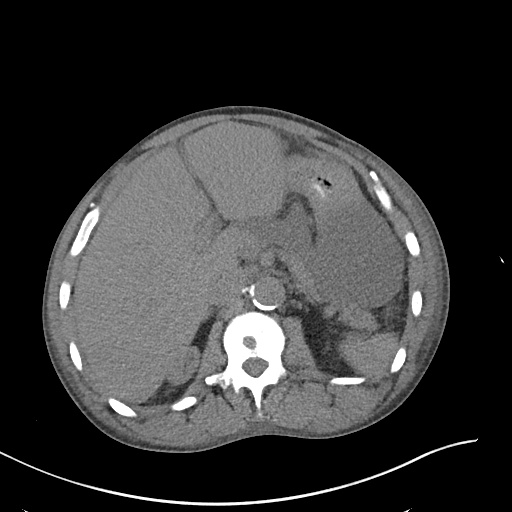
[im 72/89  soft-tissue]
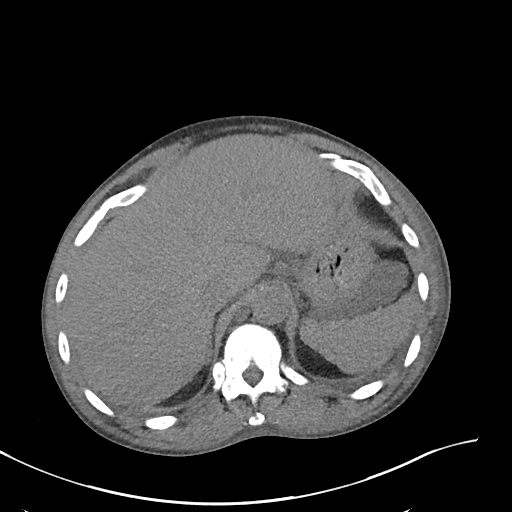
[im 78/89  soft-tissue]
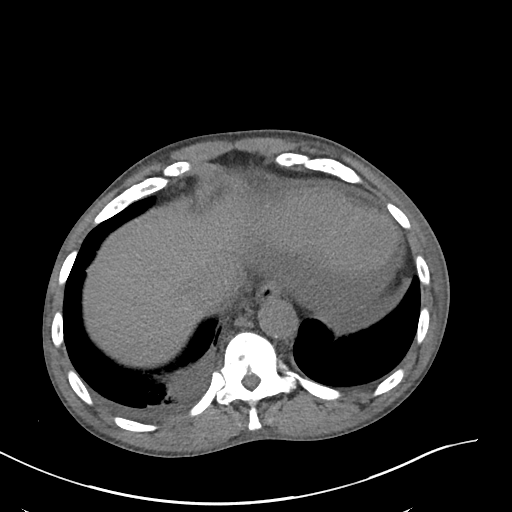
[im 85/89  soft-tissue]
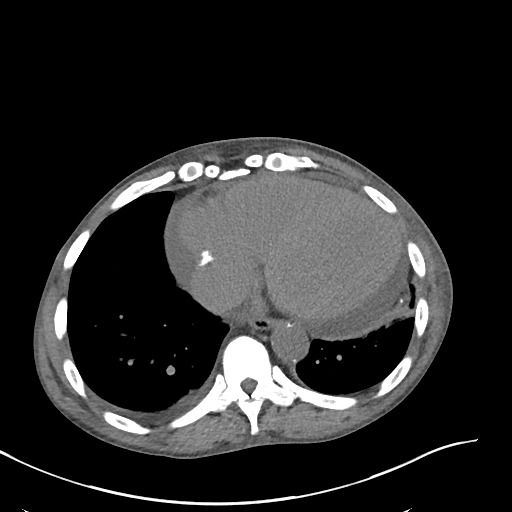

[Series 6: cor st · coronal · 0.75mm/px · 3 of 102 slices shown]
[im 34/102  soft-tissue]
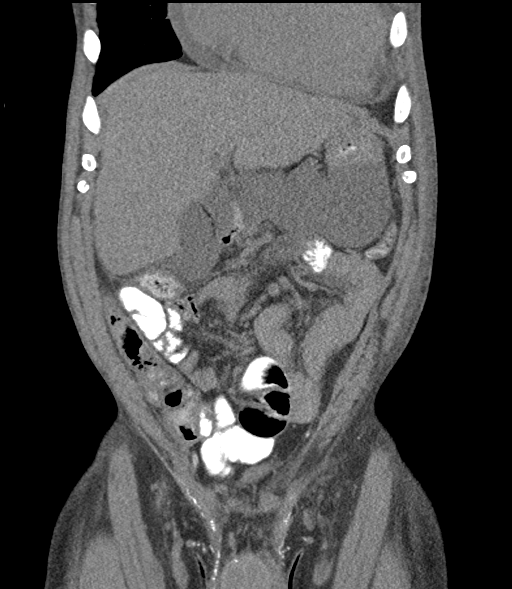
[im 45/102  soft-tissue]
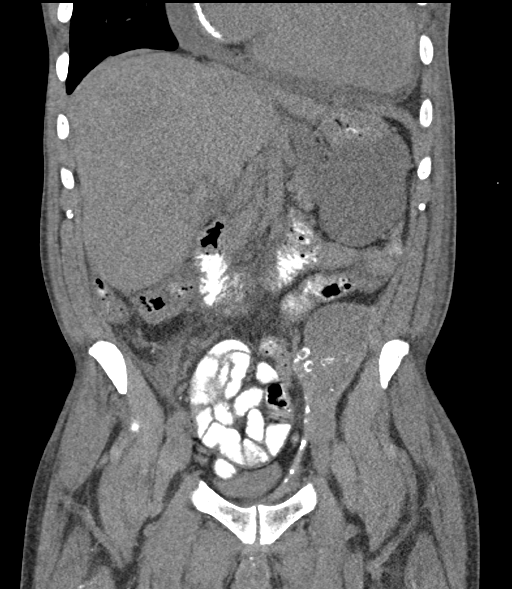
[im 57/102  soft-tissue]
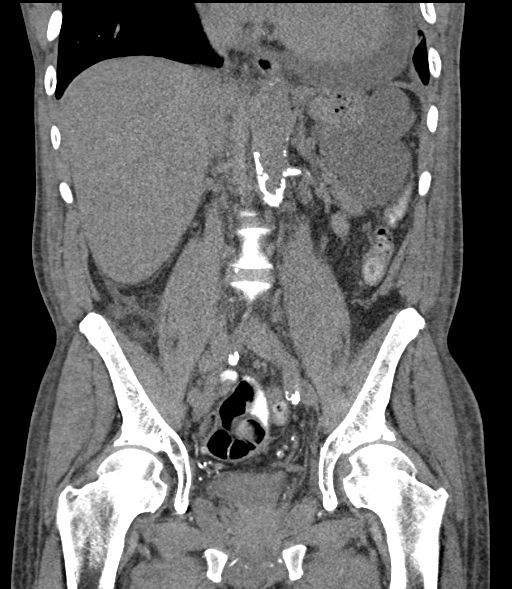

[16 of 46 positions shown; findings below may reference images not displayed]

FINDINGS: Lower chest: Pericardial effusion has gotten larger. There is now a
pleural effusion on the right with dependent pulmonary atelectasis.
Mild atelectasis again seen in the lingula.

Hepatobiliary: Increasing prominence of the left lobe of the liver
raises the possibility of developing cirrhosis. No sign of focal
hepatic lesion on this noncontrast study. No calcified gallstones.

Pancreas: Atrophic changes of the pancreas.  No mass.

Spleen: Normal

Adrenals/Urinary Tract: Adrenal glands are normal. Chronic renal
atrophy with a chronic cyst of the left kidney. Atrophic transplant
kidney in the left iliac fossa. Small amount of fluid in the
bladder.

Stomach/Bowel: No sign of bowel obstruction by CT.

Vascular/Lymphatic: Widespread atherosclerosis.  No aneurysm.

Reproductive: Normal

Other: Ascites, ascites, freely distributed but with focal
accumulation in the lesser sac as seen previously. Overall ir is
probably slightly more fluid. I do not see evidence of an abscess.
No free intraperitoneal air.

Musculoskeletal: Negative
IMPRESSION: Enlarging pericardial effusion. Newly seen right pleural effusion
with dependent atelectasis in the right lung.

Redemonstration of ascites with focal collection in the lesser sac,
similar in size to the previous study. Slightly larger volume of
ascites elsewhere within the abdomen.

Question cirrhosis of the liver.

Chronic renal atrophy. Chronic atrophy of the left iliac fossa
transplant kidney.

No acute bowel pathology identifiable.

## 2020-01-14 IMAGING — DX DG ABDOMEN 2V
2 series · 2 of 2 positions shown · non-contrast
Comparison: CT same day.

CLINICAL DATA: Abdominal distension and pain over the last 3 days.

EXAM:
ABDOMEN - 2 VIEW

[abdomen erect]
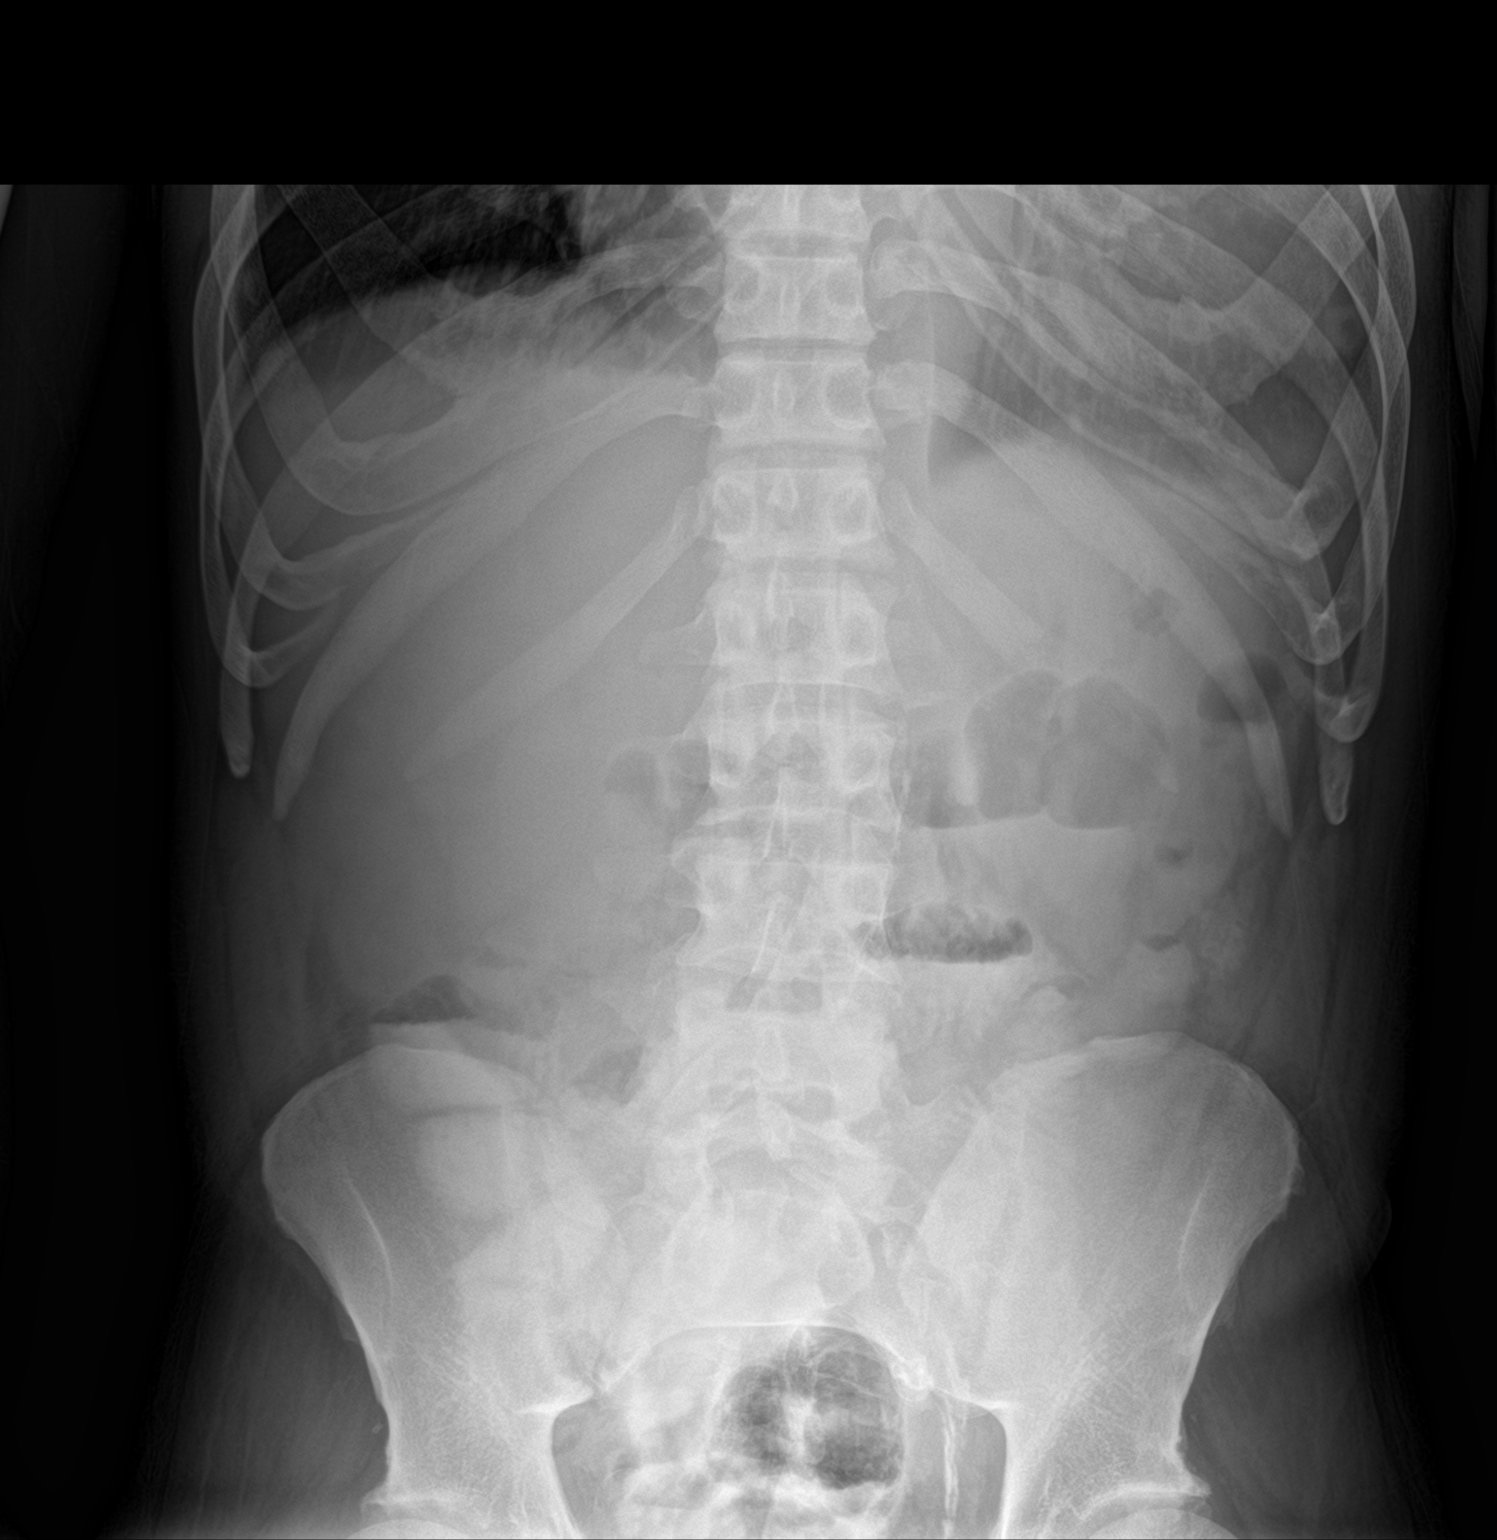

[abdomen supine]
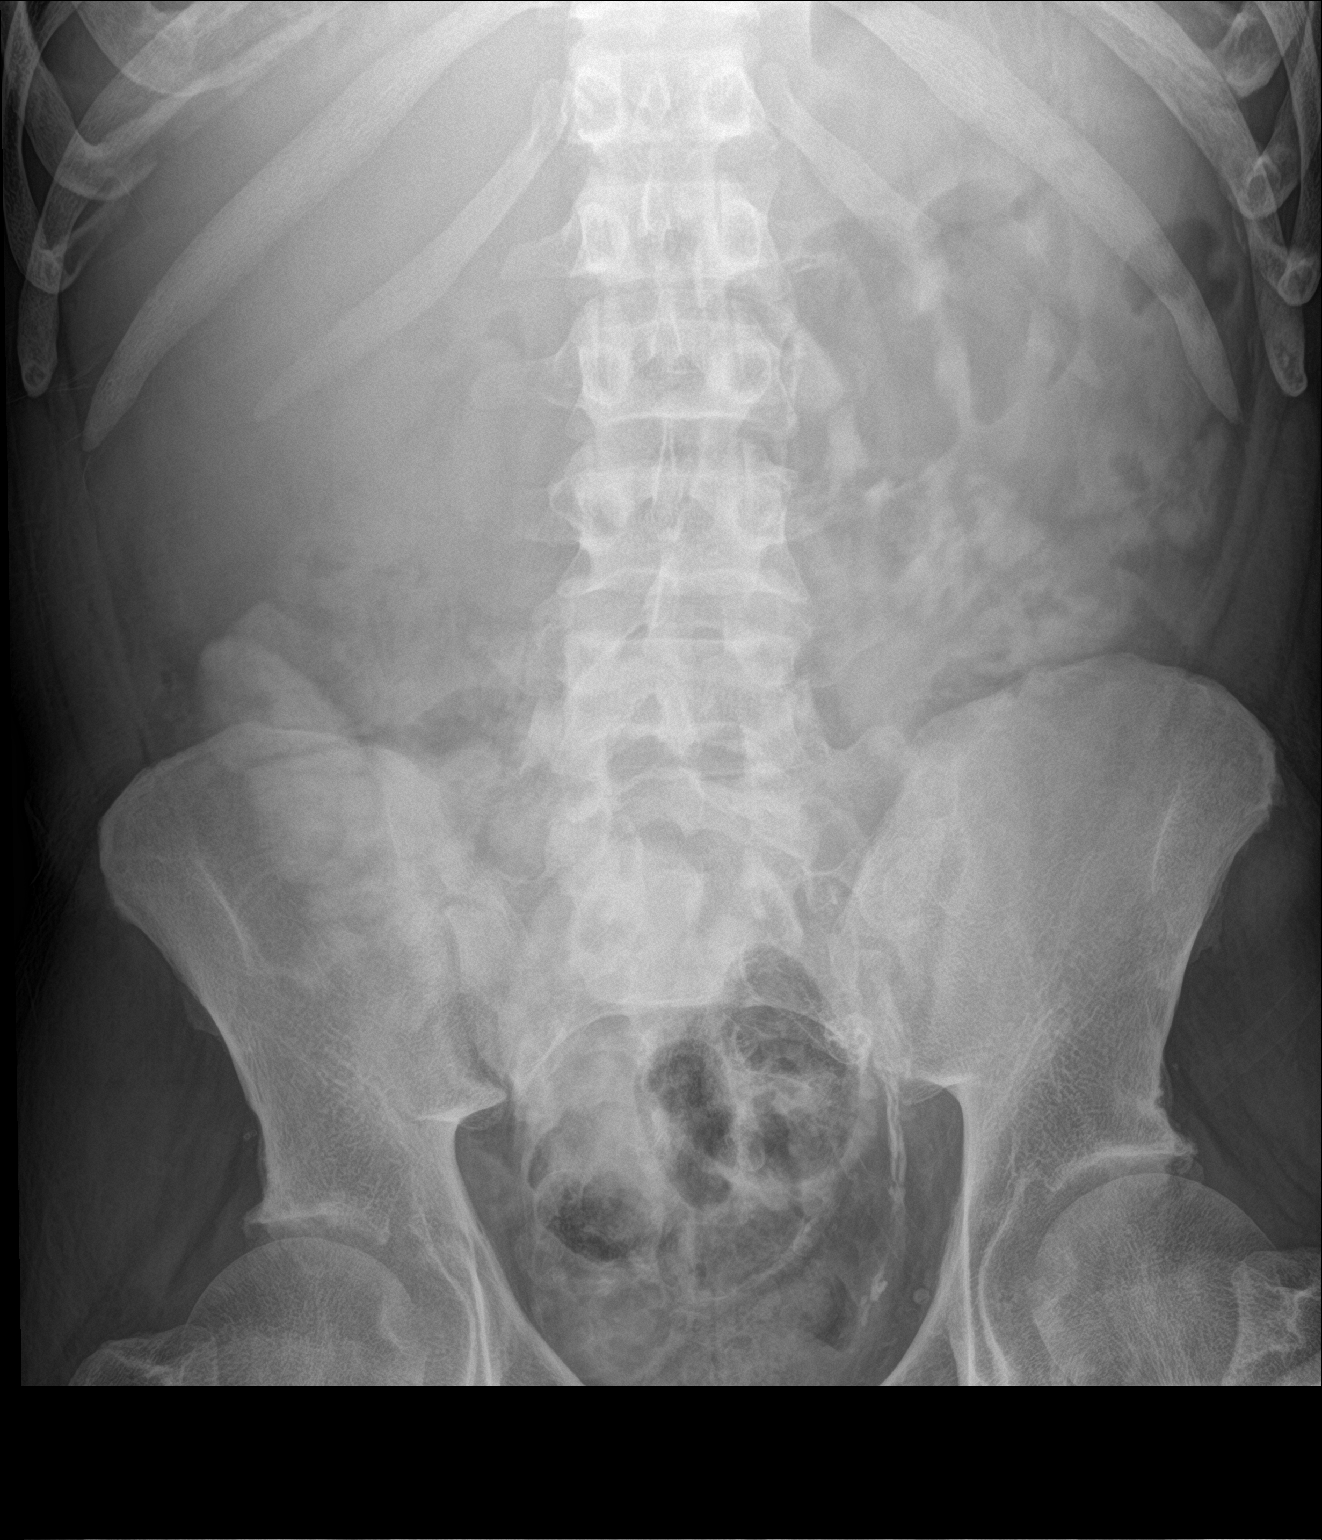

[2 of 2 positions shown; findings below may reference images not displayed]

FINDINGS: Oral contrast is present within the bowel. There are some small
air-fluid levels but no evidence of abnormally dilated loops of
significance. No free air. Extensive vascular calcification,
including within a transplant kidney in the left iliac fossa. No
acute bone finding.
IMPRESSION: Oral contrast present for CT. No suspicion of ileus, obstruction or
free air.

## 2020-01-14 MED ORDER — SODIUM CHLORIDE 0.9% FLUSH: 3.0000 mL | Freq: Once | INTRAVENOUS | Status: DC

## 2020-01-14 MED ORDER — PROCHLORPERAZINE EDISYLATE 10 MG/2ML IJ SOLN
10.0000 mg | Freq: Once | INTRAMUSCULAR | Status: AC
  Administered 2020-01-14: 10 mg via INTRAMUSCULAR
  Filled 2020-01-14: qty 2

## 2020-01-14 MED ORDER — LORAZEPAM 2 MG/ML IJ SOLN
1.0000 mg | Freq: Once | INTRAMUSCULAR | Status: DC
  Filled 2020-01-14: qty 1

## 2020-01-14 MED ORDER — OXYCODONE HCL 5 MG PO TABS
15.0000 mg | ORAL_TABLET | Freq: Once | ORAL | Status: DC
  Filled 2020-01-14: qty 3

## 2020-01-14 MED ORDER — ALUM & MAG HYDROXIDE-SIMETH 200-200-20 MG/5ML PO SUSP
30.0000 mL | Freq: Once | ORAL | Status: AC
  Administered 2020-01-14: 30 mL via ORAL
  Filled 2020-01-14: qty 30

## 2020-01-14 MED ORDER — DICYCLOMINE HCL 10 MG/ML IM SOLN
20.0000 mg | Freq: Once | INTRAMUSCULAR | Status: DC
  Filled 2020-01-14: qty 2

## 2020-01-14 MED ORDER — METHADONE HCL 5 MG PO TABS
5.0000 mg | ORAL_TABLET | Freq: Once | ORAL | Status: AC
  Administered 2020-01-14: 5 mg via ORAL
  Filled 2020-01-14: qty 1

## 2020-01-14 MED ORDER — LIDOCAINE VISCOUS HCL 2 % MT SOLN
15.0000 mL | Freq: Once | OROMUCOSAL | Status: AC
  Administered 2020-01-14: 15 mL via ORAL
  Filled 2020-01-14: qty 15

## 2020-01-14 NOTE — ED Notes (Signed)
Pt called out and said that he must go.

## 2020-01-14 NOTE — ED Triage Notes (Signed)
Pt said his blood sugar has been dropping to lows such as 34 and 54 since yesterday. He reports nausea, vomiting, abdominal pain 8/10. He is sitting on the side of the bed and appears unsteady. I asked him to sit with his back against the bed and put his legs on the bed, so I can put the rails up. He refused. I asked him two additional times to sit back in bed because he appears unsteady. Again, he refused. He said, "Mam, no, I am not going to fall."

## 2020-01-14 NOTE — ED Notes (Signed)
Patient is resting comfortably. 

## 2020-01-14 NOTE — ED Notes (Addendum)
Pt refused lorazepam IM, demanding that it be administered through his port prior to taking oxycodone. I told him we are not accessing his port. He spoke loudly and demanded that I access his port. He insisted on speaking with a doctor and refused to speak with the PA. Made provider aware.

## 2020-01-14 NOTE — ED Notes (Signed)
Pt called out stating that he was vomiting. Upon entering the room, there was no emesis. Pt has not vomited since arriving in room 3.

## 2020-01-14 NOTE — ED Notes (Signed)
Pt refuses straight stick for blood specimens. He said, "You have to access my port." I explained I will not access his port. He said loudly, "I came here the other day and a good nurse accessed my port. You can only get blood from my port. You ain't putting a needle in my arm. Do you see these fistulas in my arms." I explained that we would access a vein in his hand for the blood specimen. He refused. Made provider aware.

## 2020-01-14 NOTE — ED Triage Notes (Signed)
Pt bib ems from dialysis, received 1/2 treatment when he left dialysis. Pt started feeling dizzy, went back to dialysis to try to get the rest of his treatment. Denies NVD or chest pain. Endorses shob. Given Clonidine at dialysis.  182/118 HR 86 RR 22 94% RA CBG 114

## 2020-01-14 NOTE — ED Notes (Signed)
Pt has returned to the lobby.

## 2020-01-14 NOTE — ED Notes (Signed)
Pt went outside to get fresh air.

## 2020-01-14 NOTE — Discharge Instructions (Addendum)
Thank you for seeing [place] today! Please follow up with your GI physician as scheduled on 6/29 and with your PCP and pain management.  We hope you are able to better control these symptoms and begin to feel better.
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
## 2020-01-15 LAB — CBG MONITORING, ED
Glucose-Capillary: 120 mg/dL — ABNORMAL HIGH (ref 70–99)
Glucose-Capillary: 63 mg/dL — ABNORMAL LOW (ref 70–99)
Glucose-Capillary: 82 mg/dL (ref 70–99)

## 2020-01-15 MED ORDER — HYDRALAZINE HCL 25 MG PO TABS
100.0000 mg | ORAL_TABLET | Freq: Once | ORAL | Status: AC
  Administered 2020-01-15: 100 mg via ORAL
  Filled 2020-01-15: qty 4

## 2020-01-15 MED ORDER — CARVEDILOL 12.5 MG PO TABS: 25.0000 mg | ORAL_TABLET | Freq: Two times a day (BID) | ORAL | Status: DC

## 2020-01-15 MED ORDER — ONDANSETRON HCL 4 MG/2ML IJ SOLN
4.0000 mg | Freq: Once | INTRAMUSCULAR | Status: AC
  Administered 2020-01-15: 4 mg via INTRAMUSCULAR
  Filled 2020-01-15: qty 2

## 2020-01-15 MED ORDER — ONDANSETRON HCL 4 MG/2ML IJ SOLN: 4.0000 mg | Freq: Once | INTRAMUSCULAR | Status: DC

## 2020-01-15 MED ORDER — MORPHINE SULFATE 15 MG PO TABS: 30.0000 mg | ORAL_TABLET | ORAL | Status: DC | PRN

## 2020-01-15 MED ORDER — GLUCOSE 40 % PO GEL
1.0000 | Freq: Once | ORAL | Status: AC
  Administered 2020-01-15: 37.5 g via ORAL
  Filled 2020-01-15: qty 1

## 2020-01-15 MED ORDER — CLONIDINE HCL 0.2 MG PO TABS
0.2000 mg | ORAL_TABLET | Freq: Once | ORAL | Status: AC
  Administered 2020-01-15: 0.2 mg via ORAL
  Filled 2020-01-15: qty 1

## 2020-01-15 MED ORDER — LABETALOL HCL 5 MG/ML IV SOLN: 20.0000 mg | Freq: Once | INTRAVENOUS | Status: DC

## 2020-01-15 NOTE — ED Notes (Signed)
Pt refuses xray. EDP aware

## 2020-01-15 NOTE — ED Notes (Signed)
Pt states that he is fluid overloaded and does not want to be discharged. EDP notified
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
## 2020-01-15 NOTE — ED Notes (Signed)
Pt advised to lay down RN told pt that it looks like he's going to fall. Pt keeps bobbing down towards front falling asleep. Pt refuses states, "looks can be deceiving."

## 2020-01-17 ENCOUNTER — Encounter (HOSPITAL_COMMUNITY): Payer: Self-pay

## 2020-01-17 ENCOUNTER — Emergency Department (HOSPITAL_COMMUNITY)
Admission: EM | Admit: 2020-01-17 | Discharge: 2020-01-17 | Disposition: A | Discharge status: Home / Self Care | Payer: Medicare Other | Attending: Emergency Medicine | Admitting: Emergency Medicine

## 2020-01-17 ENCOUNTER — Other Ambulatory Visit: Payer: Self-pay

## 2020-01-17 DIAGNOSIS — R05 Cough: Secondary | ICD-10-CM | POA: Insufficient documentation

## 2020-01-17 DIAGNOSIS — Z5321 Procedure and treatment not carried out due to patient leaving prior to being seen by health care provider: Secondary | ICD-10-CM | POA: Insufficient documentation

## 2020-01-17 LAB — CBG MONITORING, ED: Glucose-Capillary: 83 mg/dL (ref 70–99)

## 2020-01-17 IMAGING — NM NM HEPATO W/GB/PHARM/[PERSON_NAME]
4 series · 19 of 19 positions shown · non-contrast
Comparison: None.

CLINICAL DATA: Abdominal pain and pancreatitis

EXAM:
NUCLEAR MEDICINE HEPATOBILIARY IMAGING WITH GALLBLADDER EF
VIEWS:
Anterior, right lateral right upper quadrant
RADIOPHARMACEUTICALS:  5.15 mCi Pc-NNm  Choletec IV

[Series 1: he hepatobiliary_motion_corrected motion corrected · 3.10mm/px · 6 of 60 frames shown]
[frame 6/60]
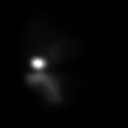
[frame 16/60]
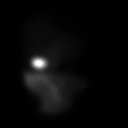
[frame 26/60]
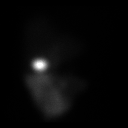
[frame 36/60]
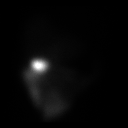
[frame 46/60]
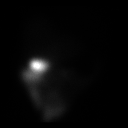
[frame 56/60]
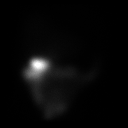

[he hepatobiliary · 3.10mm/px · 6 of 60 frames shown (1 of 3)]
[frame 6/60]
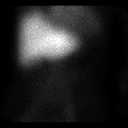
[frame 16/60]
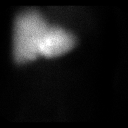
[frame 26/60]
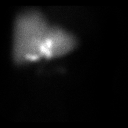
[frame 36/60]
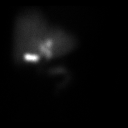
[frame 46/60]
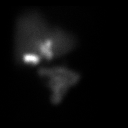
[frame 56/60]
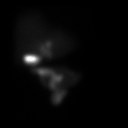

[he hepatobiliary · 3.10mm/px · 6 of 60 frames shown (2 of 3)]
[frame 6/60]
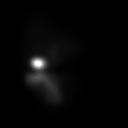
[frame 16/60]
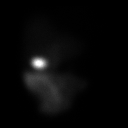
[frame 26/60]
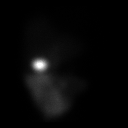
[frame 36/60]
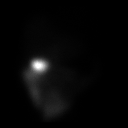
[frame 46/60]
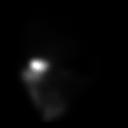
[frame 56/60]
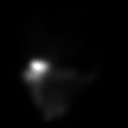

[he hepatobiliary · 2.26mm/px · 1 of 1 slices shown (3 of 3)]
[im 1/1]
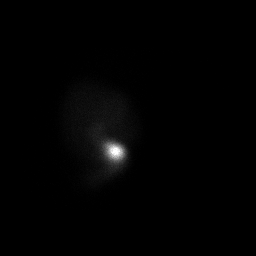

[19 of 19 positions shown; findings below may reference images not displayed]

FINDINGS: Liver uptake of radiotracer is normal. There is prompt visualization
of gallbladder and small bowel, indicating patency of the cystic and
common bile ducts. The patient consumed 8 ounces of Ensure orally
with calculation of the computer generated ejection fraction of
radiotracer from the gallbladder. No report of clinical symptoms
with the oral Ensure consumption. The computer generated ejection
fraction of radiotracer from the gallbladder is normal at 44%,
normal greater than 33% using the oral agent.
IMPRESSION: Study within normal limits.

## 2020-01-17 NOTE — ED Notes (Signed)
Pt refuses blood work states he is going to leave

## 2020-01-17 NOTE — ED Notes (Signed)
Patient has left.

## 2020-01-17 NOTE — ED Triage Notes (Signed)
Pt states that he has been coughing for the past two day,yellow phlegm, denies fevers, SOB, last dialysis today.

## 2020-01-21 ENCOUNTER — Other Ambulatory Visit: Payer: Self-pay

## 2020-01-21 ENCOUNTER — Emergency Department
Admission: EM | Admit: 2020-01-21 | Discharge: 2020-01-21 | Disposition: A | Discharge status: Home / Self Care | Payer: Medicare Other | Attending: Emergency Medicine | Admitting: Emergency Medicine

## 2020-01-21 DIAGNOSIS — Z79899 Other long term (current) drug therapy: Secondary | ICD-10-CM | POA: Diagnosis not present

## 2020-01-21 DIAGNOSIS — I5042 Chronic combined systolic (congestive) and diastolic (congestive) heart failure: Secondary | ICD-10-CM | POA: Diagnosis not present

## 2020-01-21 DIAGNOSIS — E1122 Type 2 diabetes mellitus with diabetic chronic kidney disease: Secondary | ICD-10-CM | POA: Insufficient documentation

## 2020-01-21 DIAGNOSIS — I132 Hypertensive heart and chronic kidney disease with heart failure and with stage 5 chronic kidney disease, or end stage renal disease: Secondary | ICD-10-CM | POA: Insufficient documentation

## 2020-01-21 DIAGNOSIS — Z7901 Long term (current) use of anticoagulants: Secondary | ICD-10-CM | POA: Diagnosis not present

## 2020-01-21 DIAGNOSIS — Z87891 Personal history of nicotine dependence: Secondary | ICD-10-CM | POA: Diagnosis not present

## 2020-01-21 DIAGNOSIS — R109 Unspecified abdominal pain: Secondary | ICD-10-CM | POA: Diagnosis present

## 2020-01-21 DIAGNOSIS — G894 Chronic pain syndrome: Secondary | ICD-10-CM

## 2020-01-21 DIAGNOSIS — Z959 Presence of cardiac and vascular implant and graft, unspecified: Secondary | ICD-10-CM | POA: Diagnosis not present

## 2020-01-21 DIAGNOSIS — N186 End stage renal disease: Secondary | ICD-10-CM | POA: Diagnosis not present

## 2020-01-21 DIAGNOSIS — R1013 Epigastric pain: Secondary | ICD-10-CM | POA: Diagnosis not present
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
## 2020-01-21 NOTE — ED Notes (Signed)
Pt refused to sign discharge papers. Pt discharged to lobby.

## 2020-01-21 NOTE — ED Notes (Signed)
After initial triage, patient sat with legs hanging off side of the stretcher and refused to lay back. Pt kept falling asleep and pitching forward but became angry when this nurse held him by the shoulders to keep him from falling. He refused to lay back in the bed. Pt refused any blood work Diplomatic Services operational officer contacted about refusal to lay back in bed and [place] NT assigned as Actuary. Pt stated "I don't need no god damn Public librarian, she can get the hell on out". Pt was informed that having someone in the room was not optional. Dr Corky Downs discussed pts previous bloodwork from [facility] on Wednesday and cancelled ordered labs. Pt up for discharge. Pt asked "how you intend to get me back, I have dialysis today". He refused to call anyone and demanded a cab voucher. Pt informed that it is not the hospitals responsibility to get patients home. Charge trying to contact [employer]. Pt discharge to lobby to wait for cab. First nurse updated on situation.

## 2020-01-21 NOTE — ED Triage Notes (Signed)
Pt [place] ED [place] via [facility] with c/c of abdominal pain and shortness of breath. EMS reports that patient refused to lay in stretcher in ambulance and would not allow any evaluation other than vital signs. EMS reports transport vitals of 150/90, O2 sat 89% on room air and then 95% on 3L via nasal cannula. Upon arrival pt alert but unable or unwilling to answer orientation questions. Pt has Hx of dialysis and states last was on Wednesday. Dr Corky Downs at patient bedside.

## 2020-01-22 ENCOUNTER — Inpatient Hospital Stay (HOSPITAL_COMMUNITY)
Admission: EM | Admit: 2020-01-22 | Discharge: 2020-01-31 | DRG: 291 | Disposition: A | Discharge status: Home / Self Care | Payer: Medicare Other | Attending: Family Medicine | Admitting: Family Medicine

## 2020-01-22 ENCOUNTER — Other Ambulatory Visit: Payer: Self-pay

## 2020-01-22 ENCOUNTER — Encounter (HOSPITAL_COMMUNITY): Payer: Self-pay | Admitting: Emergency Medicine

## 2020-01-22 ENCOUNTER — Emergency Department (HOSPITAL_COMMUNITY): Payer: Medicare Other

## 2020-01-22 DIAGNOSIS — E8770 Fluid overload, unspecified: Secondary | ICD-10-CM | POA: Diagnosis present

## 2020-01-22 DIAGNOSIS — Z888 Allergy status to other drugs, medicaments and biological substances status: Secondary | ICD-10-CM

## 2020-01-22 DIAGNOSIS — F419 Anxiety disorder, unspecified: Secondary | ICD-10-CM | POA: Diagnosis present

## 2020-01-22 DIAGNOSIS — N186 End stage renal disease: Secondary | ICD-10-CM | POA: Diagnosis present

## 2020-01-22 DIAGNOSIS — K219 Gastro-esophageal reflux disease without esophagitis: Secondary | ICD-10-CM | POA: Diagnosis present

## 2020-01-22 DIAGNOSIS — N189 Chronic kidney disease, unspecified: Secondary | ICD-10-CM

## 2020-01-22 DIAGNOSIS — Y830 Surgical operation with transplant of whole organ as the cause of abnormal reaction of the patient, or of later complication, without mention of misadventure at the time of the procedure: Secondary | ICD-10-CM | POA: Diagnosis present

## 2020-01-22 DIAGNOSIS — D631 Anemia in chronic kidney disease: Secondary | ICD-10-CM | POA: Diagnosis present

## 2020-01-22 DIAGNOSIS — J9601 Acute respiratory failure with hypoxia: Secondary | ICD-10-CM

## 2020-01-22 DIAGNOSIS — Z992 Dependence on renal dialysis: Secondary | ICD-10-CM

## 2020-01-22 DIAGNOSIS — K861 Other chronic pancreatitis: Secondary | ICD-10-CM | POA: Diagnosis present

## 2020-01-22 DIAGNOSIS — Y92410 Unspecified street and highway as the place of occurrence of the external cause: Secondary | ICD-10-CM | POA: Diagnosis not present

## 2020-01-22 DIAGNOSIS — I5042 Chronic combined systolic (congestive) and diastolic (congestive) heart failure: Secondary | ICD-10-CM | POA: Diagnosis present

## 2020-01-22 DIAGNOSIS — K146 Glossodynia: Secondary | ICD-10-CM | POA: Diagnosis present

## 2020-01-22 DIAGNOSIS — I313 Pericardial effusion (noninflammatory): Secondary | ICD-10-CM | POA: Diagnosis present

## 2020-01-22 DIAGNOSIS — D696 Thrombocytopenia, unspecified: Secondary | ICD-10-CM

## 2020-01-22 DIAGNOSIS — Z20822 Contact with and (suspected) exposure to covid-19: Secondary | ICD-10-CM | POA: Diagnosis present

## 2020-01-22 DIAGNOSIS — I700 Atherosclerosis of aorta: Secondary | ICD-10-CM | POA: Diagnosis present

## 2020-01-22 DIAGNOSIS — R748 Abnormal levels of other serum enzymes: Secondary | ICD-10-CM

## 2020-01-22 DIAGNOSIS — Z86718 Personal history of other venous thrombosis and embolism: Secondary | ICD-10-CM

## 2020-01-22 DIAGNOSIS — Z66 Do not resuscitate: Secondary | ICD-10-CM | POA: Diagnosis present

## 2020-01-22 DIAGNOSIS — J441 Chronic obstructive pulmonary disease with (acute) exacerbation: Secondary | ICD-10-CM

## 2020-01-22 DIAGNOSIS — J810 Acute pulmonary edema: Secondary | ICD-10-CM

## 2020-01-22 DIAGNOSIS — I132 Hypertensive heart and chronic kidney disease with heart failure and with stage 5 chronic kidney disease, or end stage renal disease: Secondary | ICD-10-CM | POA: Diagnosis present

## 2020-01-22 DIAGNOSIS — R109 Unspecified abdominal pain: Secondary | ICD-10-CM

## 2020-01-22 DIAGNOSIS — I5023 Acute on chronic systolic (congestive) heart failure: Secondary | ICD-10-CM

## 2020-01-22 DIAGNOSIS — G4700 Insomnia, unspecified: Secondary | ICD-10-CM | POA: Diagnosis present

## 2020-01-22 DIAGNOSIS — Z8249 Family history of ischemic heart disease and other diseases of the circulatory system: Secondary | ICD-10-CM

## 2020-01-22 DIAGNOSIS — T8612 Kidney transplant failure: Secondary | ICD-10-CM | POA: Diagnosis present

## 2020-01-22 DIAGNOSIS — Z79899 Other long term (current) drug therapy: Secondary | ICD-10-CM

## 2020-01-22 DIAGNOSIS — E785 Hyperlipidemia, unspecified: Secondary | ICD-10-CM | POA: Diagnosis present

## 2020-01-22 DIAGNOSIS — E1122 Type 2 diabetes mellitus with diabetic chronic kidney disease: Secondary | ICD-10-CM | POA: Diagnosis present

## 2020-01-22 DIAGNOSIS — K7460 Unspecified cirrhosis of liver: Secondary | ICD-10-CM | POA: Diagnosis present

## 2020-01-22 DIAGNOSIS — G8929 Other chronic pain: Secondary | ICD-10-CM | POA: Diagnosis present

## 2020-01-22 DIAGNOSIS — Z885 Allergy status to narcotic agent status: Secondary | ICD-10-CM

## 2020-01-22 DIAGNOSIS — G4733 Obstructive sleep apnea (adult) (pediatric): Secondary | ICD-10-CM | POA: Diagnosis present

## 2020-01-22 DIAGNOSIS — Z7901 Long term (current) use of anticoagulants: Secondary | ICD-10-CM

## 2020-01-22 DIAGNOSIS — Z9115 Patient's noncompliance with renal dialysis: Secondary | ICD-10-CM

## 2020-01-22 DIAGNOSIS — Z87891 Personal history of nicotine dependence: Secondary | ICD-10-CM

## 2020-01-22 LAB — COMPREHENSIVE METABOLIC PANEL
ALT: 13 U/L (ref 0–44)
AST: 26 U/L (ref 15–41)
Albumin: 3.1 g/dL — ABNORMAL LOW (ref 3.5–5.0)
Alkaline Phosphatase: 203 U/L — ABNORMAL HIGH (ref 38–126)
Anion gap: 17 — ABNORMAL HIGH (ref 5–15)
BUN: 55 mg/dL — ABNORMAL HIGH (ref 6–20)
CO2: 24 mmol/L (ref 22–32)
Calcium: 7.7 mg/dL — ABNORMAL LOW (ref 8.9–10.3)
Chloride: 98 mmol/L (ref 98–111)
Creatinine, Ser: 13.11 mg/dL — ABNORMAL HIGH (ref 0.61–1.24)
GFR calc Af Amer: 4 mL/min — ABNORMAL LOW (ref >60)
GFR calc non Af Amer: 4 mL/min — ABNORMAL LOW (ref >60)
Glucose, Bld: 106 mg/dL — ABNORMAL HIGH (ref 70–99)
Potassium: 5.1 mmol/L (ref 3.5–5.1)
Sodium: 139 mmol/L (ref 135–145)
Total Bilirubin: 0.8 mg/dL (ref 0.3–1.2)
Total Protein: 8.1 g/dL (ref 6.5–8.1)

## 2020-01-22 LAB — TROPONIN I (HIGH SENSITIVITY)
Troponin I (High Sensitivity): 32 ng/L — ABNORMAL HIGH (ref <18)
Troponin I (High Sensitivity): 31 ng/L — ABNORMAL HIGH (ref <18)

## 2020-01-22 LAB — CBC WITH DIFFERENTIAL/PLATELET
Abs Immature Granulocytes: 0 10*3/uL (ref 0.00–0.07)
Basophils Absolute: 0 10*3/uL (ref 0.0–0.1)
Basophils Relative: 0 %
Eosinophils Absolute: 0.1 10*3/uL (ref 0.0–0.5)
Eosinophils Relative: 3 %
HCT: 28.9 % — ABNORMAL LOW (ref 39.0–52.0)
Hemoglobin: 8.7 g/dL — ABNORMAL LOW (ref 13.0–17.0)
Lymphocytes Relative: 21 %
Lymphs Abs: 0.8 10*3/uL (ref 0.7–4.0)
MCH: 26.8 pg (ref 26.0–34.0)
MCHC: 30.1 g/dL (ref 30.0–36.0)
MCV: 88.9 fL (ref 80.0–100.0)
Monocytes Absolute: 0.1 10*3/uL (ref 0.1–1.0)
Monocytes Relative: 3 %
Neutro Abs: 2.8 10*3/uL (ref 1.7–7.7)
Neutrophils Relative %: 73 %
Platelets: 107 10*3/uL — ABNORMAL LOW (ref 150–400)
RBC: 3.25 MIL/uL — ABNORMAL LOW (ref 4.22–5.81)
RDW: 18.2 % — ABNORMAL HIGH (ref 11.5–15.5)
WBC: 3.9 10*3/uL — ABNORMAL LOW (ref 4.0–10.5)
nRBC: 0 % (ref 0.0–0.2)

## 2020-01-22 LAB — SARS CORONAVIRUS 2 BY RT PCR (HOSPITAL ORDER, PERFORMED IN ~~LOC~~ HOSPITAL LAB): SARS Coronavirus 2: NEGATIVE

## 2020-01-22 MED ORDER — SENNOSIDES-DOCUSATE SODIUM 8.6-50 MG PO TABS
2.0000 | ORAL_TABLET | Freq: Every day | ORAL | Status: DC
  Administered 2020-01-23 – 2020-01-30 (×5): 2 via ORAL
  Filled 2020-01-22 (×6): qty 2

## 2020-01-22 MED ORDER — ALBUTEROL SULFATE HFA 108 (90 BASE) MCG/ACT IN AERS
4.0000 | INHALATION_SPRAY | Freq: Once | RESPIRATORY_TRACT | Status: AC
  Administered 2020-01-22: 4 via RESPIRATORY_TRACT
  Filled 2020-01-22: qty 6.7

## 2020-01-22 MED ORDER — HYDROMORPHONE HCL 1 MG/ML IJ SOLN: 1.5000 mg | Freq: Once | INTRAMUSCULAR | Status: AC

## 2020-01-22 MED ORDER — ENOXAPARIN SODIUM 30 MG/0.3ML ~~LOC~~ SOLN
30.0000 mg | Freq: Every day | SUBCUTANEOUS | Status: DC
  Administered 2020-01-23 – 2020-01-30 (×5): 30 mg via SUBCUTANEOUS
  Filled 2020-01-22 (×6): qty 0.3

## 2020-01-22 MED ORDER — HYDRALAZINE HCL 50 MG PO TABS
100.0000 mg | ORAL_TABLET | Freq: Three times a day (TID) | ORAL | Status: DC
  Administered 2020-01-23 – 2020-01-30 (×16): 100 mg via ORAL
  Filled 2020-01-22 (×20): qty 2

## 2020-01-22 MED ORDER — HYDROMORPHONE HCL 1 MG/ML IJ SOLN
INTRAMUSCULAR | Status: AC
  Administered 2020-01-22: 1.5 mg via INTRAVENOUS
  Filled 2020-01-22: qty 1.5

## 2020-01-22 MED ORDER — ALBUTEROL SULFATE (2.5 MG/3ML) 0.083% IN NEBU
2.5000 mg | INHALATION_SOLUTION | RESPIRATORY_TRACT | Status: DC | PRN
  Administered 2020-01-26 – 2020-01-30 (×6): 2.5 mg via RESPIRATORY_TRACT
  Filled 2020-01-22 (×6): qty 3

## 2020-01-22 MED ORDER — PREDNISONE 20 MG PO TABS
40.0000 mg | ORAL_TABLET | Freq: Every day | ORAL | Status: AC
  Administered 2020-01-23 – 2020-01-27 (×5): 40 mg via ORAL
  Filled 2020-01-22 (×5): qty 2

## 2020-01-22 MED ORDER — AZITHROMYCIN 500 MG PO TABS
500.0000 mg | ORAL_TABLET | Freq: Every day | ORAL | Status: AC
  Administered 2020-01-23: 500 mg via ORAL
  Filled 2020-01-22: qty 1

## 2020-01-22 MED ORDER — TEMAZEPAM 15 MG PO CAPS
15.0000 mg | ORAL_CAPSULE | Freq: Every evening | ORAL | Status: DC | PRN
  Administered 2020-01-24 – 2020-01-29 (×3): 15 mg via ORAL
  Filled 2020-01-22 (×4): qty 1

## 2020-01-22 MED ORDER — BENZONATATE 100 MG PO CAPS
100.0000 mg | ORAL_CAPSULE | Freq: Two times a day (BID) | ORAL | Status: DC | PRN
  Administered 2020-01-24 – 2020-01-27 (×2): 100 mg via ORAL
  Filled 2020-01-22 (×2): qty 1

## 2020-01-22 MED ORDER — CHLORHEXIDINE GLUCONATE CLOTH 2 % EX PADS
6.0000 | MEDICATED_PAD | Freq: Every day | CUTANEOUS | Status: DC
  Administered 2020-01-23: 6 via TOPICAL

## 2020-01-22 MED ORDER — CYCLOBENZAPRINE HCL 10 MG PO TABS
10.0000 mg | ORAL_TABLET | Freq: Three times a day (TID) | ORAL | Status: DC | PRN
  Administered 2020-01-24 – 2020-01-27 (×2): 10 mg via ORAL
  Filled 2020-01-22 (×2): qty 1

## 2020-01-22 MED ORDER — AMLODIPINE BESYLATE 10 MG PO TABS
10.0000 mg | ORAL_TABLET | Freq: Every day | ORAL | Status: DC
  Administered 2020-01-25 – 2020-01-30 (×6): 10 mg via ORAL
  Filled 2020-01-22 (×7): qty 1

## 2020-01-22 MED ORDER — PANTOPRAZOLE SODIUM 40 MG PO TBEC
40.0000 mg | DELAYED_RELEASE_TABLET | Freq: Two times a day (BID) | ORAL | Status: DC
  Administered 2020-01-23 – 2020-01-30 (×14): 40 mg via ORAL
  Filled 2020-01-22 (×16): qty 1

## 2020-01-22 MED ORDER — CARVEDILOL 25 MG PO TABS
25.0000 mg | ORAL_TABLET | Freq: Two times a day (BID) | ORAL | Status: DC
  Administered 2020-01-23 – 2020-01-30 (×13): 25 mg via ORAL
  Filled 2020-01-22 (×15): qty 1

## 2020-01-22 MED ORDER — HYDROMORPHONE HCL 1 MG/ML IJ SOLN
1.0000 mg | Freq: Once | INTRAMUSCULAR | Status: AC
  Administered 2020-01-22: 1 mg via INTRAVENOUS
  Filled 2020-01-22: qty 1

## 2020-01-22 MED ORDER — IPRATROPIUM-ALBUTEROL 0.5-2.5 (3) MG/3ML IN SOLN
3.0000 mL | Freq: Four times a day (QID) | RESPIRATORY_TRACT | Status: DC
  Administered 2020-01-23 (×4): 3 mL via RESPIRATORY_TRACT
  Filled 2020-01-22 (×4): qty 3

## 2020-01-22 MED ORDER — AZITHROMYCIN 500 MG PO TABS
250.0000 mg | ORAL_TABLET | Freq: Every day | ORAL | Status: AC
  Administered 2020-01-23 – 2020-01-26 (×4): 250 mg via ORAL
  Filled 2020-01-22 (×4): qty 1

## 2020-01-22 MED ORDER — ONDANSETRON 4 MG PO TBDP: 4.0000 mg | ORAL_TABLET | Freq: Three times a day (TID) | ORAL | Status: DC | PRN

## 2020-01-22 MED ORDER — BENZOCAINE 10 % MT GEL
Freq: Two times a day (BID) | OROMUCOSAL | Status: DC | PRN
  Filled 2020-01-22: qty 9

## 2020-01-22 MED ORDER — ONDANSETRON HCL 4 MG/2ML IJ SOLN
4.0000 mg | Freq: Once | INTRAMUSCULAR | Status: AC
  Administered 2020-01-22: 4 mg via INTRAVENOUS
  Filled 2020-01-22: qty 2

## 2020-01-22 MED ORDER — OXYCODONE HCL 5 MG PO TABS
10.0000 mg | ORAL_TABLET | Freq: Four times a day (QID) | ORAL | Status: DC | PRN
  Administered 2020-01-23 – 2020-01-30 (×12): 10 mg via ORAL
  Filled 2020-01-22 (×15): qty 2

## 2020-01-22 NOTE — ED Notes (Signed)
Pt requests EDP at bedside, request relayed to EDP. Warm blankets provided. Pt appears somnolent and repeatedly leans forward as if he might fall off edge of bed. This RN asks pt to lie in bed in a safer position, pt adamantly refuses despite safety education.

## 2020-01-22 NOTE — ED Notes (Signed)
Pt want labs with iv

## 2020-01-22 NOTE — ED Triage Notes (Signed)
Pt. Sleeping in chair in triage.

## 2020-01-22 NOTE — ED Notes (Signed)
Pt in xray per lobby tech, xray to transport to room 27 upon completion of imaging

## 2020-01-22 NOTE — ED Notes (Signed)
Pt. Requesting an EKG
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
## 2020-01-22 NOTE — ED Notes (Signed)
Pt refused to allow staff to swab his nose for covid, states he has swabbed himself for one year. Swab given to pt to obtain.

## 2020-01-22 NOTE — ED Notes (Signed)
Patient refused EKG.
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
## 2020-01-22 NOTE — ED Notes (Addendum)
Attempted IV access, able to obtain blood return for labs but unable to advance catheter. IV team consulted

## 2020-01-23 ENCOUNTER — Encounter (HOSPITAL_COMMUNITY): Payer: Self-pay | Admitting: Family Medicine

## 2020-01-23 ENCOUNTER — Other Ambulatory Visit: Payer: Self-pay

## 2020-01-23 DIAGNOSIS — R109 Unspecified abdominal pain: Secondary | ICD-10-CM

## 2020-01-23 DIAGNOSIS — E8770 Fluid overload, unspecified: Secondary | ICD-10-CM

## 2020-01-23 DIAGNOSIS — G8929 Other chronic pain: Secondary | ICD-10-CM

## 2020-01-23 DIAGNOSIS — D696 Thrombocytopenia, unspecified: Secondary | ICD-10-CM

## 2020-01-23 DIAGNOSIS — R748 Abnormal levels of other serum enzymes: Secondary | ICD-10-CM

## 2020-01-23 DIAGNOSIS — J441 Chronic obstructive pulmonary disease with (acute) exacerbation: Secondary | ICD-10-CM

## 2020-01-23 LAB — RENAL FUNCTION PANEL
Albumin: 3 g/dL — ABNORMAL LOW (ref 3.5–5.0)
Albumin: 2.6 g/dL — ABNORMAL LOW (ref 3.5–5.0)
Anion gap: 9 (ref 5–15)
Anion gap: 11 (ref 5–15)
BUN: 17 mg/dL (ref 6–20)
BUN: 20 mg/dL (ref 6–20)
CO2: 29 mmol/L (ref 22–32)
CO2: 25 mmol/L (ref 22–32)
Calcium: 8 mg/dL — ABNORMAL LOW (ref 8.9–10.3)
Calcium: 8.1 mg/dL — ABNORMAL LOW (ref 8.9–10.3)
Chloride: 99 mmol/L (ref 98–111)
Chloride: 100 mmol/L (ref 98–111)
Creatinine, Ser: 5.14 mg/dL — ABNORMAL HIGH (ref 0.61–1.24)
Creatinine, Ser: 6.5 mg/dL — ABNORMAL HIGH (ref 0.61–1.24)
GFR calc Af Amer: 14 mL/min — ABNORMAL LOW (ref >60)
GFR calc Af Amer: 10 mL/min — ABNORMAL LOW (ref >60)
GFR calc non Af Amer: 12 mL/min — ABNORMAL LOW (ref >60)
GFR calc non Af Amer: 9 mL/min — ABNORMAL LOW (ref >60)
Glucose, Bld: 74 mg/dL (ref 70–99)
Glucose, Bld: 117 mg/dL — ABNORMAL HIGH (ref 70–99)
Phosphorus: 3 mg/dL (ref 2.5–4.6)
Phosphorus: 4 mg/dL (ref 2.5–4.6)
Potassium: 3.3 mmol/L — ABNORMAL LOW (ref 3.5–5.1)
Potassium: 3.5 mmol/L (ref 3.5–5.1)
Sodium: 137 mmol/L (ref 135–145)
Sodium: 136 mmol/L (ref 135–145)

## 2020-01-23 LAB — CBC
HCT: 26.3 % — ABNORMAL LOW (ref 39.0–52.0)
Hemoglobin: 8.1 g/dL — ABNORMAL LOW (ref 13.0–17.0)
MCH: 27 pg (ref 26.0–34.0)
MCHC: 30.8 g/dL (ref 30.0–36.0)
MCV: 87.7 fL (ref 80.0–100.0)
Platelets: 123 10*3/uL — ABNORMAL LOW (ref 150–400)
RBC: 3 MIL/uL — ABNORMAL LOW (ref 4.22–5.81)
RDW: 18 % — ABNORMAL HIGH (ref 11.5–15.5)
WBC: 3.3 10*3/uL — ABNORMAL LOW (ref 4.0–10.5)
nRBC: 0 % (ref 0.0–0.2)

## 2020-01-23 LAB — HEMOGLOBIN A1C
Hgb A1c MFr Bld: 5.4 % (ref 4.8–5.6)
Mean Plasma Glucose: 108.28 mg/dL

## 2020-01-23 MED ORDER — RENA-VITE PO TABS
1.0000 | ORAL_TABLET | Freq: Every day | ORAL | Status: DC
  Administered 2020-01-23 – 2020-01-30 (×6): 1 via ORAL
  Filled 2020-01-23 (×7): qty 1

## 2020-01-23 MED ORDER — HEPARIN SODIUM (PORCINE) 1000 UNIT/ML IJ SOLN
INTRAMUSCULAR | Status: AC
  Filled 2020-01-23: qty 5

## 2020-01-23 MED ORDER — DARBEPOETIN ALFA 200 MCG/0.4ML IJ SOSY
200.0000 ug | PREFILLED_SYRINGE | INTRAMUSCULAR | Status: DC
  Administered 2020-01-27: 200 ug via INTRAVENOUS
  Filled 2020-01-23: qty 0.4

## 2020-01-23 MED ORDER — PRO-STAT SUGAR FREE PO LIQD
30.0000 mL | Freq: Two times a day (BID) | ORAL | Status: DC
  Administered 2020-01-23 – 2020-01-29 (×8): 30 mL via ORAL
  Filled 2020-01-23 (×12): qty 30

## 2020-01-23 MED ORDER — CHLORHEXIDINE GLUCONATE CLOTH 2 % EX PADS: 6.0000 | MEDICATED_PAD | Freq: Every day | CUTANEOUS | Status: DC

## 2020-01-23 NOTE — Progress Notes (Signed)
Pt. Refused MRSA swab.
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
## 2020-01-23 NOTE — Progress Notes (Signed)
Admitted from ED via stretcher. Patient walked to bed from stretcher and sat on side of bed.Patient asked to get into bed due to his being sleepy and falling asleep sitting on side of bed and head almost hitting over bed table. Patient adamantly refuses. Bed alarm placed on bed.

## 2020-01-23 NOTE — Progress Notes (Signed)
[facility] Progress Note   Dialysis Orders: [facility] MWF  4h   400/800   EDW 79  2/2 bath  LFA AVF (healing post-revision)/ L IJ TDC Hep none Na thiosulfate 25 since February 2021 for RUE wound - started during [facility] adm Mircera 200 - last 6/23 venofer 100 x 10 start 6/30 Na thio 25 gm q HD  Last incenter HD 6/28 net UF 3.6 to post wt 84.8  Assessment/Plan: 1. SOB/ pulm edema/ vol overload/ diffuse LE edema -net UF 3.1 - completed HD early today - was not weighed - needs additional volume removal with HD Monday. Well above EDW - hard to know if EDW is accurate - may be too low  2. Exacerbation of COPD - meds per primary - significant wheezing today but has taken off his O2 while sleeping  3. ESRD - on HD MWF - next HD Monday. 4. HD access - recent AVF surgery (plication it appears), not done here - looks good. Using [facility] now 5. Chronic abd pain - affecting some of his dialysis, gets worse w/ dialysis per pt. May be narcotic dependent, has oxy IR at home.  6. H/o chronic pancreatitis/abdominal pain 7. HTN - longstanding - current meds and volume removal 8. OSA - on CPAP at home 9. Anemia ckd - get meds in am, Hb 8.1 - ESA due Wed - hold Fe here due to ferrlicit allergy - start full course of venofer after d/c 10. MBD/ hx RUE calciphylaxis Ca/P ok  - previously on parsabiv but [facility] d/c due to cost - on Na thio since February - RUE wound seems to have healed ?? D/c - will discuss with Dr. Jonnie Finner - resume fosrenol - said he was out of this med -I have refilled via [facility] 11. Nutrition - alb low - add prostat to renal diet/vits  [name], PA-C [place] [phone] 01/23/2020,10:05 AM  LOS: 1 day   Subjective:   Coughing a lot - declines O2 -  Objective Vitals:   01/23/20 0301 01/23/20 0312 01/23/20 0857 01/23/20 0932  BP:    (!) 124/52  Pulse:  87  (!) 58  Resp:  18  18  Temp:      TempSrc:      SpO2: 90% 90% 92% 100%   Physical  Exam General: coughing up sputum Heart: RRR Lungs: bilateral diffuse exp wheezes Abdomen: soft NT Extremities: + LE edema , right upper extremity with scar - well healed. Dialysis Access: left lower AVF resting while healing + bruit left IJ [facility]   Additional Objective Labs: Basic Metabolic Panel: Recent Labs  Lab 01/22/20 1544 01/23/20 0211 01/23/20 0356  NA 139 137 136  K 5.1 3.3* 3.5  CL 98 99 100  CO2 _0 GLUCOSE 106* 74 117*  BUN 55* 17 20  CREATININE 13.11* 5.14* 6.50*  CALCIUM 7.7* 8.0* 8.1*  PHOS  --  3.0 4.0   Liver Function Tests: Recent Labs  Lab 01/22/20 1544 01/23/20 0211 01/23/20 0356  AST 26  --   --   ALT 13  --   --   ALKPHOS 203*  --   --   BILITOT 0.8  --   --   PROT 8.1  --   --   ALBUMIN 3.1* 3.0* 2.6*   No results for input(s): LIPASE, AMYLASE in the last 168 hours. CBC: Recent Labs  Lab 01/22/20 1544 01/23/20 0356  WBC 3.9* 3.3*  NEUTROABS  2.8  --   HGB 8.7* 8.1*  HCT 28.9* 26.3*  MCV 88.9 87.7  PLT 107* 123*   Blood Culture    Component Value Date/Time   SDES PLEURAL 07/29/2018 1300   SDES PLEURAL 07/29/2018 1300   SPECREQUEST NONE 07/29/2018 1300   SPECREQUEST NONE 07/29/2018 1300   CULT  07/29/2018 1300    NO GROWTH 5 DAYS Performed at [facility], [place] [address] [place], [place] [address]    REPTSTATUS 08/03/2018 FINAL 07/29/2018 1300   REPTSTATUS 07/29/2018 FINAL 07/29/2018 1300    Cardiac Enzymes: No results for input(s): CKTOTAL, CKMB, CKMBINDEX, TROPONINI in the last 168 hours. CBG: Recent Labs  Lab 01/17/20 2114  GLUCAP 83   Iron Studies: No results for input(s): IRON, TIBC, TRANSFERRIN, FERRITIN in the last 72 hours. Lab Results  Component Value Date   INR 1.1 06/28/2019   INR 1.39 07/28/2018   INR 1.36 04/26/2018   Studies/Results: DG Chest 2 View  Result Date: 01/22/2020 CLINICAL DATA:  Productive cough, dialysis EXAM: CHEST - 2 VIEW COMPARISON:  01/01/2020 FINDINGS: Gross cardiomegaly  with large bore left neck multi lumen vascular catheter. Small right, trace left pleural effusions and diffuse interstitial pulmonary opacity. IMPRESSION: Small right, trace left pleural effusions and diffuse interstitial pulmonary opacity, likely edema in the setting of cardiomegaly. Findings are not significantly changed compared to prior radiograph dated 01/01/2020. Electronically Signed   By: Eddie Candle M.D.   On: 01/22/2020 15:17   Medications:  . amLODipine  10 mg Oral Daily  . azithromycin  250 mg Oral Daily  . carvedilol  25 mg Oral BID  . Chlorhexidine Gluconate Cloth  6 each Topical Q0600  . enoxaparin (LOVENOX) injection  30 mg Subcutaneous Daily  . heparin sodium (porcine)      . hydrALAZINE  100 mg Oral TID  . ipratropium-albuterol  3 mL Nebulization Q6H  . pantoprazole  40 mg Oral BID AC  . predniSONE  40 mg Oral Q breakfast  . senna-docusate  2 tablet Oral QHS

## 2020-01-24 LAB — RENAL FUNCTION PANEL
Albumin: 2.7 g/dL — ABNORMAL LOW (ref 3.5–5.0)
Anion gap: 13 (ref 5–15)
BUN: 39 mg/dL — ABNORMAL HIGH (ref 6–20)
CO2: 24 mmol/L (ref 22–32)
Calcium: 7.4 mg/dL — ABNORMAL LOW (ref 8.9–10.3)
Chloride: 100 mmol/L (ref 98–111)
Creatinine, Ser: 9.7 mg/dL — ABNORMAL HIGH (ref 0.61–1.24)
GFR calc Af Amer: 6 mL/min — ABNORMAL LOW (ref >60)
GFR calc non Af Amer: 5 mL/min — ABNORMAL LOW (ref >60)
Glucose, Bld: 205 mg/dL — ABNORMAL HIGH (ref 70–99)
Phosphorus: 4.6 mg/dL (ref 2.5–4.6)
Potassium: 4.4 mmol/L (ref 3.5–5.1)
Sodium: 137 mmol/L (ref 135–145)

## 2020-01-24 LAB — CBC
HCT: 26.5 % — ABNORMAL LOW (ref 39.0–52.0)
Hemoglobin: 8 g/dL — ABNORMAL LOW (ref 13.0–17.0)
MCH: 26.8 pg (ref 26.0–34.0)
MCHC: 30.2 g/dL (ref 30.0–36.0)
MCV: 88.6 fL (ref 80.0–100.0)
Platelets: 149 10*3/uL — ABNORMAL LOW (ref 150–400)
RBC: 2.99 MIL/uL — ABNORMAL LOW (ref 4.22–5.81)
RDW: 18 % — ABNORMAL HIGH (ref 11.5–15.5)
WBC: 4.9 10*3/uL (ref 4.0–10.5)
nRBC: 0 % (ref 0.0–0.2)

## 2020-01-24 MED ORDER — HEPARIN SODIUM (PORCINE) 1000 UNIT/ML IJ SOLN
INTRAMUSCULAR | Status: AC
  Administered 2020-01-24: 5200 [IU]
  Filled 2020-01-24: qty 6

## 2020-01-24 MED ORDER — LUBRIDERM SERIOUSLY SENSITIVE EX LOTN
TOPICAL_LOTION | Freq: Two times a day (BID) | CUTANEOUS | Status: DC
  Filled 2020-01-24: qty 473

## 2020-01-24 MED ORDER — IPRATROPIUM-ALBUTEROL 0.5-2.5 (3) MG/3ML IN SOLN
3.0000 mL | RESPIRATORY_TRACT | Status: DC | PRN
  Administered 2020-01-25: 3 mL via RESPIRATORY_TRACT
  Filled 2020-01-24: qty 3

## 2020-01-24 MED ORDER — BENZOCAINE 10 % MT GEL
Freq: Two times a day (BID) | OROMUCOSAL | Status: DC
  Filled 2020-01-24 (×2): qty 9

## 2020-01-24 MED ORDER — DICLOFENAC SODIUM 1 % EX GEL
2.0000 g | Freq: Four times a day (QID) | CUTANEOUS | Status: DC | PRN
  Filled 2020-01-24: qty 100
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
## 2020-01-24 NOTE — Progress Notes (Signed)
Patient refused labs, reported labs could be gotten in dialysis.
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
## 2020-01-24 NOTE — Progress Notes (Addendum)
[facility] Progress Note   Dialysis Orders: [facility] MWF  4h   400/800   EDW 79  2/2 bath  LFA AVF (healing post-revision)/ L IJ TDC Hep none Na thiosulfate 25 since February 2021 for RUE wound - started during [facility] adm Mircera 200 - last 6/23 venofer 100 x 10 start 6/30 Na thio 25 gm q HD  Last incenter HD 6/28 net UF 3.6 to post wt 84.8  Assessment/Plan: 1. SOB/ pulm edema/ vol overload/ diffuse LE edema: s/p HD on 7/3. Well above EDW - hard to know if EDW is accurate - may be too low - Lungs much better today 2. Exacerbation of COPD - meds per primary -imrpoved 3. ESRD - on HD MWF - next HD today - labs to be drawn in HD K 3.5 - start on 4 K bath 4. HD access - recent AVF surgery (plication it appears), not done here - looks good. Using [facility] now 5. Chronic abd pain - affecting some of his dialysis, gets worse w/ dialysis per pt. May be narcotic dependent, has oxy IR at home.  6. H/o chronic pancreatitis/abdominal pain 7. HTN - longstanding - current meds and volume removal - need standing wts 8. OSA - on CPAP at home per notes  9. Anemia ckd - Hb 8.1 - ESA due Wed - hold Fe here due to ferrlicit allergy - start full course of venofer after d/c 10. MBD/ hx RUE calciphylaxis Ca/P ok  - previously on parsabiv but [facility] d/c due to cost - on Na thio since February - RUE wound seems to have healed.  Have not resumed during hospitalization.  ? Discontinue.   Resumed fosrenol - said he was out of this med -I have refilled via [facility] 11. Nutrition - alb low - add prostat to renal diet/vits  [name], PA-C [place] [phone] 01/24/2020,10:23 AM  LOS: 2 days   Subjective:   Sleeping supine on room air.  Easily wakens.  Wants to know when his dialysis is scheduled today. Objective Vitals:   01/23/20 1643 01/23/20 2023 01/23/20 2036 01/23/20 2136  BP: 100/70  (!) 134/55   Pulse: 74  78 80  Resp: _0 Temp: 98.6 F (37 C)  98 F (36.7  C)   TempSrc: Oral  Oral   SpO2: 99% 98% 97% 97%  Weight:    79.8 kg   Physical Exam General: NAD supine in bed Heart: RRR Lungs: no wheezes or rales Abdomen: soft NT Extremities: + LE edema , right upper extremity with scar - well healed. Dialysis Access: left lower AVF resting while healing + bruit left IJ [facility]   Additional Objective Labs: Basic Metabolic Panel: Recent Labs  Lab 01/22/20 1544 01/23/20 0211 01/23/20 0356  NA 139 137 136  K 5.1 3.3* 3.5  CL 98 99 100  CO2 _1 GLUCOSE 106* 74 117*  BUN 55* 17 20  CREATININE 13.11* 5.14* 6.50*  CALCIUM 7.7* 8.0* 8.1*  PHOS  --  3.0 4.0   Liver Function Tests: Recent Labs  Lab 01/22/20 1544 01/23/20 0211 01/23/20 0356  AST 26  --   --   ALT 13  --   --   ALKPHOS 203*  --   --   BILITOT 0.8  --   --   PROT 8.1  --   --   ALBUMIN 3.1* 3.0* 2.6*   No results for input(s): LIPASE, AMYLASE in  the last 168 hours. CBC: Recent Labs  Lab 01/22/20 1544 01/23/20 0356  WBC 3.9* 3.3*  NEUTROABS 2.8  --   HGB 8.7* 8.1*  HCT 28.9* 26.3*  MCV 88.9 87.7  PLT 107* 123*   Blood Culture    Component Value Date/Time   SDES PLEURAL 07/29/2018 1300   SDES PLEURAL 07/29/2018 1300   SPECREQUEST NONE 07/29/2018 1300   SPECREQUEST NONE 07/29/2018 1300   CULT  07/29/2018 1300    NO GROWTH 5 DAYS Performed at [facility], [place] [address] [place], [place] [address]    REPTSTATUS 08/03/2018 FINAL 07/29/2018 1300   REPTSTATUS 07/29/2018 FINAL 07/29/2018 1300    Cardiac Enzymes: No results for input(s): CKTOTAL, CKMB, CKMBINDEX, TROPONINI in the last 168 hours. CBG: Recent Labs  Lab 01/17/20 2114  GLUCAP 83   Iron Studies: No results for input(s): IRON, TIBC, TRANSFERRIN, FERRITIN in the last 72 hours. Lab Results  Component Value Date   INR 1.1 06/28/2019   INR 1.39 07/28/2018   INR 1.36 04/26/2018   Studies/Results: DG Chest 2 View  Result Date: 01/22/2020 CLINICAL DATA:  Productive cough, dialysis  EXAM: CHEST - 2 VIEW COMPARISON:  01/01/2020 FINDINGS: Gross cardiomegaly with large bore left neck multi lumen vascular catheter. Small right, trace left pleural effusions and diffuse interstitial pulmonary opacity. IMPRESSION: Small right, trace left pleural effusions and diffuse interstitial pulmonary opacity, likely edema in the setting of cardiomegaly. Findings are not significantly changed compared to prior radiograph dated 01/01/2020. Electronically Signed   By: Eddie Candle M.D.   On: 01/22/2020 15:17   Medications:  . amLODipine  10 mg Oral Daily  . azithromycin  250 mg Oral Daily  . benzocaine   Mouth/Throat BID AC  . carvedilol  25 mg Oral BID  . Chlorhexidine Gluconate Cloth  6 each Topical Q0600  . [START ON 01/26/2020] darbepoetin (ARANESP) injection - DIALYSIS  200 mcg Intravenous Q Wed-HD  . enoxaparin (LOVENOX) injection  30 mg Subcutaneous Daily  . feeding supplement (PRO-STAT SUGAR FREE 64)  30 mL Oral BID  . hydrALAZINE  100 mg Oral TID  . lubriderm seriously sensitive   Topical BID  . multivitamin  1 tablet Oral QHS  . pantoprazole  40 mg Oral BID AC  . predniSONE  40 mg Oral Q breakfast  . senna-docusate  2 tablet Oral QHS    Nephrology attending: Patient was seen and examined bedside.  Chart reviewed.  I agree with assessment plan as outlined above. Some shortness of breath and volume up.  Plan for dialysis today.  Calcium phosphorus acceptable.  On ESA for anemia.  No issue with access.  Katheran James, MD [facility].

## 2020-01-25 LAB — CBC
HCT: 29 % — ABNORMAL LOW (ref 39.0–52.0)
Hemoglobin: 8.9 g/dL — ABNORMAL LOW (ref 13.0–17.0)
MCH: 27.6 pg (ref 26.0–34.0)
MCHC: 30.7 g/dL (ref 30.0–36.0)
MCV: 89.8 fL (ref 80.0–100.0)
Platelets: 138 10*3/uL — ABNORMAL LOW (ref 150–400)
RBC: 3.23 MIL/uL — ABNORMAL LOW (ref 4.22–5.81)
RDW: 18.3 % — ABNORMAL HIGH (ref 11.5–15.5)
WBC: 5.5 10*3/uL (ref 4.0–10.5)
nRBC: 0 % (ref 0.0–0.2)

## 2020-01-25 MED ORDER — GUAIFENESIN-DM 100-10 MG/5ML PO SYRP
5.0000 mL | ORAL_SOLUTION | Freq: Four times a day (QID) | ORAL | Status: DC | PRN
  Administered 2020-01-28 – 2020-01-31 (×4): 5 mL via ORAL
  Filled 2020-01-25 (×4): qty 5

## 2020-01-25 MED ORDER — IPRATROPIUM-ALBUTEROL 0.5-2.5 (3) MG/3ML IN SOLN
3.0000 mL | Freq: Four times a day (QID) | RESPIRATORY_TRACT | Status: DC
  Administered 2020-01-25: 3 mL via RESPIRATORY_TRACT
  Filled 2020-01-25: qty 3

## 2020-01-25 MED ORDER — LANTHANUM CARBONATE 500 MG PO CHEW
1000.0000 mg | CHEWABLE_TABLET | Freq: Three times a day (TID) | ORAL | Status: DC
  Administered 2020-01-25 – 2020-01-31 (×9): 1000 mg via ORAL
  Filled 2020-01-25 (×13): qty 2

## 2020-01-25 MED ORDER — LUBRIDERM SERIOUSLY SENSITIVE EX LOTN: TOPICAL_LOTION | Freq: Every day | CUTANEOUS | Status: DC

## 2020-01-25 NOTE — Progress Notes (Addendum)
[name] Progress Note   Subjective:   Completed dialysis yesterday 3L UF  Some cough today, but breathing improved. Tolerating some PO, denies n/v.   Objective Vitals:   01/24/20 1800 01/24/20 2114 01/25/20 0204 01/25/20 0529  BP: 131/68 (!) 143/94    Pulse: 75 78    Resp:  18    Temp:  98.9 F (37.2 C)    TempSrc:  Oral    SpO2: 91% 96% 97%   Weight:  77.3 kg    Height:    _0  (1.88 m)     Additional Objective Labs: Basic Metabolic Panel: Recent Labs  Lab 01/23/20 0211 01/23/20 0356 01/24/20 1344  NA 137 136 137  K 3.3* 3.5 4.4  CL 99 100 100  CO2 _1 GLUCOSE 74 117* 205*  BUN 17 20 39*  CREATININE 5.14* 6.50* 9.70*  CALCIUM 8.0* 8.1* 7.4*  PHOS 3.0 4.0 4.6   CBC: Recent Labs  Lab 01/22/20 1544 01/22/20 1544 01/23/20 0356 01/24/20 1343 01/25/20 0833  WBC 3.9*   < > 3.3* 4.9 5.5  NEUTROABS 2.8  --   --   --   --   HGB 8.7*   < > 8.1* 8.0* 8.9*  HCT 28.9*   < > 26.3* 26.5* 29.0*  MCV 88.9  --  87.7 88.6 89.8  PLT 107*   < > 123* 149* 138*   < > = values in this interval not displayed.   Blood Culture    Component Value Date/Time   SDES PLEURAL 07/29/2018 1300   SDES PLEURAL 07/29/2018 1300   SPECREQUEST NONE 07/29/2018 1300   SPECREQUEST NONE 07/29/2018 1300   CULT  07/29/2018 1300    NO GROWTH 5 DAYS Performed at [facility], [place] [address], [place], [place] [address]    REPTSTATUS 08/03/2018 FINAL 07/29/2018 1300   REPTSTATUS 07/29/2018 FINAL 07/29/2018 1300     Physical Exam General: Ill appearing, nad Heart: RR Lungs: Clear, occasional wheezes Abdomen: soft, non-tender Extremities: trace LE edema Dialysis Access: L IJ TDC and L AVF +bruit   Medications:  . amLODipine  10 mg Oral Daily  . azithromycin  250 mg Oral Daily  . benzocaine   Mouth/Throat BID AC  . carvedilol  25 mg Oral BID  . Chlorhexidine Gluconate Cloth  6 each Topical Q0600  . [START ON 01/26/2020] darbepoetin (ARANESP) injection -  DIALYSIS  200 mcg Intravenous Q Wed-HD  . enoxaparin (LOVENOX) injection  30 mg Subcutaneous Daily  . feeding supplement (PRO-STAT SUGAR FREE 64)  30 mL Oral BID  . hydrALAZINE  100 mg Oral TID  . lubriderm seriously sensitive   Topical Daily  . multivitamin  1 tablet Oral QHS  . pantoprazole  40 mg Oral BID AC  . predniSONE  40 mg Oral Q breakfast  . senna-docusate  2 tablet Oral QHS    Dialysis Orders: [facility] MWF 4h 400/800 EDW 792/2 bath LFA AVF (healing post-revision)/ L IJ TDC Hep none Na thiosulfate 25 since February 2021 for RUE wound - started during [facility] adm Mircera 200 - last 6/23 venofer 100 x 10 start 6/30 Na thio 25 gm q HD  Last incenter HD 6/28 net UF 3.6 to post wt 84.8  Assessment/Plan: 1. SOB/ pulm edema/ vol overload/ diffuse LE edema. HD on 7/3, 7/5. With 6L removed. Now below outpatient dry weight. Continue to challenge EDW as tolerated.  2. Exacerbation of COPD - meds per primary -imrpoved  3. ESRD - on HD MWF. Next HD 7/7. Will write orders in case still here.   4. HD access - recent AVF surgery. Using [facility] now 5. Chronic abd pain - affecting some of his dialysis, gets worse w/ dialysis per pt. May be narcotic dependent, has oxy IR at home.  6. H/o chronic pancreatitis/abdominal pain 7. HTN - longstanding - current meds and volume removal - need standing wts 8. OSA - on CPAP 9. Anemia of CKD- Hb 8.9. 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  Have not resumed during hospitalization.  ? Discontinue.   Resumed Fosrenol. 11. Nutrition - alb low - add prostat to renal diet/vits  Lynnda Child PA-C [facility] 01/25/2020,9:37 AM  Nephrology attending: Seen and examined.   I agree with assessment and plan as outlined  above.  Status post dialysis yesterday with 3 L ultrafiltration.  Still having mild short of breath.  Plan for next HD tomorrow.  I will increase the goal to 3 to 3.5 kg.  We need to lower his dry weight as outpatient. Okay to discharge from renal perspective and continue his dialysis tomorrow as outpatient.  Katheran James, MD [facility].

## 2020-01-25 NOTE — Evaluation (Signed)
Physical Therapy Evaluation Patient Details Name: Frank [name] MRN: [id number] DOB: March 20, 1966 Today's Date: 01/25/2020   History of Present Illness  Reports approximate 4-day history of worsening shortness of breath and productive cough, in the setting of missing HD. Afebrile, appears hypervolemic on exam with diffuse expiratory wheezing and prolonged expiratory phase.  Clinical Impression  Patient received sitting up on side of bed eating cereal. Agreeable to PT assessment. Patient independent with bed mobility and transfers. Ambulated 120 feet with RW and supervision. Then observed ambulating in hallway independently without ad. No lob with mobility. Reports sob, O2 sats at 97% during ambulation. He will benefit from continued skilled PT to improve activity tolerance.     Follow Up Recommendations No PT follow up    Equipment Recommendations  None recommended by PT    Recommendations for Other Services       Precautions / Restrictions Precautions Precautions: None Restrictions Weight Bearing Restrictions: No      Mobility  Bed Mobility Overal bed mobility: Independent                Transfers Overall transfer level: Independent                  Ambulation/Gait Ambulation/Gait assistance: Independent Gait Distance (Feet): 120 Feet Assistive device: Rolling walker (2 wheeled) Gait Pattern/deviations: Step-through pattern Gait velocity: WNL   General Gait Details: patient required 2 brief standing rest breaks due to reported sob. O2 sats at 97% during ambulation. Grabbed rw to assist with ambulation, but would then leave it and ambulate without ad in room without difficulty  Stairs            Wheelchair Mobility    Modified Rankin (Stroke Patients Only)       Balance Overall balance assessment: Independent                                           Pertinent Vitals/Pain Pain Assessment: No/denies pain    Home Living  Family/patient expects to be discharged to:: Private residence Living Arrangements: Alone Available Help at Discharge: Family;Available PRN/intermittently Type of Home: Apartment Home Access: Stairs to enter     Home Layout: One level   Additional Comments: home setup from prior admission, he reports he does not have walker    Prior Function Level of Independence: Independent               Hand Dominance        Extremity/Trunk Assessment   Upper Extremity Assessment Upper Extremity Assessment: Overall WFL for tasks assessed    Lower Extremity Assessment Lower Extremity Assessment: Overall WFL for tasks assessed    Cervical / Trunk Assessment Cervical / Trunk Assessment: Normal  Communication   Communication: No difficulties  Cognition Arousal/Alertness: Awake/alert Behavior During Therapy: WFL for tasks assessed/performed;Flat affect Overall Cognitive Status: No family/caregiver present to determine baseline cognitive functioning                                 General Comments: patient slow to respond      General Comments      Exercises     Assessment/Plan    PT Assessment Patient needs continued PT services  PT Problem List Decreased activity tolerance;Cardiopulmonary status limiting activity       PT Treatment  Interventions Gait training;Therapeutic activities;Therapeutic exercise;Functional mobility training;Patient/family education    PT Goals (Current goals can be found in the Care Plan section)  Acute Rehab PT Goals Patient Stated Goal: to return home PT Goal Formulation: With patient Time For Goal Achievement: 02/08/20 Potential to Achieve Goals: Good    Frequency Min 2X/week   Barriers to discharge        Co-evaluation               AM-PAC PT "6 Clicks" Mobility  Outcome Measure Help needed turning from your back to your side while in a flat bed without using bedrails?: None Help needed moving from lying on  your back to sitting on the side of a flat bed without using bedrails?: None Help needed moving to and from a bed to a chair (including a wheelchair)?: A Little Help needed standing up from a chair using your arms (e.g., wheelchair or bedside chair)?: None Help needed to walk in hospital room?: None Help needed climbing 3-5 steps with a railing? : A Little 6 Click Score: 22    End of Session   Activity Tolerance: Patient tolerated treatment well Patient left: in bed;Other (comment) (sitting up on side of bed) Nurse Communication: Mobility status PT Visit Diagnosis: Muscle weakness (generalized) (M62.81)    Time: 1430-1440 PT Time Calculation (min) (ACUTE ONLY): 10 min   Charges:   PT Evaluation $PT Eval Moderate Complexity: 1 Mod          Frank [name], PT, GCS 01/25/20,2:53 PM
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
## 2020-01-25 NOTE — Progress Notes (Signed)
PT Cancellation Note  Patient Details Name: Frank [name] MRN: [id number] DOB: Jun 12, 1966   Cancelled Treatment:    Reason Eval/Treat Not Completed: Patient declined, no reason specified. Patient asks that I return after lunch. States he has not slept.    Regnald Bowens 01/25/2020, 10:52 AM

## 2020-01-26 MED ORDER — SODIUM CHLORIDE 0.9 % IV SOLN: 100.0000 mL | INTRAVENOUS | Status: DC | PRN

## 2020-01-26 MED ORDER — HYDROMORPHONE HCL 1 MG/ML IJ SOLN
0.5000 mg | Freq: Once | INTRAMUSCULAR | Status: AC | PRN
  Administered 2020-01-26: 0.5 mg via INTRAVENOUS

## 2020-01-26 MED ORDER — LIDOCAINE HCL (PF) 1 % IJ SOLN: 5.0000 mL | INTRAMUSCULAR | Status: DC | PRN

## 2020-01-26 MED ORDER — PENTAFLUOROPROP-TETRAFLUOROETH EX AERO: 1.0000 "application " | INHALATION_SPRAY | CUTANEOUS | Status: DC | PRN

## 2020-01-26 MED ORDER — HYDROMORPHONE HCL 1 MG/ML IJ SOLN
INTRAMUSCULAR | Status: AC
  Filled 2020-01-26: qty 0.5

## 2020-01-26 MED ORDER — DARBEPOETIN ALFA 200 MCG/0.4ML IJ SOSY
PREFILLED_SYRINGE | INTRAMUSCULAR | Status: AC
  Filled 2020-01-26: qty 0.4

## 2020-01-26 MED ORDER — LIDOCAINE-PRILOCAINE 2.5-2.5 % EX CREA: 1.0000 "application " | TOPICAL_CREAM | CUTANEOUS | Status: DC | PRN

## 2020-01-26 MED ORDER — ALTEPLASE 2 MG IJ SOLR: 2.0000 mg | Freq: Once | INTRAMUSCULAR | Status: DC | PRN

## 2020-01-26 MED ORDER — HEPARIN SODIUM (PORCINE) 1000 UNIT/ML DIALYSIS: 1000.0000 [IU] | INTRAMUSCULAR | Status: DC | PRN
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
## 2020-01-26 NOTE — Progress Notes (Addendum)
[facility] Progress Note   Subjective:   Seen in room, some cough, sob today. Belly feels full. For dialysis today.   Objective Vitals:   01/25/20 1445 01/25/20 1800 01/25/20 2041 01/26/20 0048  BP:  (!) 158/62 (!) 159/110   Pulse:  71 75   Resp:  18 18   Temp:  97.8 F (36.6 C) 98 F (36.7 C)   TempSrc:  Oral    SpO2: 97% 95% 99% 96%  Weight:      Height:         Additional Objective Labs: Basic Metabolic Panel: Recent Labs  Lab 01/23/20 0211 01/23/20 0356 01/24/20 1344  NA 137 136 137  K 3.3* 3.5 4.4  CL 99 100 100  CO2 _0 GLUCOSE 74 117* 205*  BUN 17 20 39*  CREATININE 5.14* 6.50* 9.70*  CALCIUM 8.0* 8.1* 7.4*  PHOS 3.0 4.0 4.6   CBC: Recent Labs  Lab 01/22/20 1544 01/22/20 1544 01/23/20 0356 01/24/20 1343 01/25/20 0833  WBC 3.9*   < > 3.3* 4.9 5.5  NEUTROABS 2.8  --   --   --   --   HGB 8.7*   < > 8.1* 8.0* 8.9*  HCT 28.9*   < > 26.3* 26.5* 29.0*  MCV 88.9  --  87.7 88.6 89.8  PLT 107*   < > 123* 149* 138*   < > = values in this interval not displayed.   Blood Culture    Component Value Date/Time   SDES PLEURAL 07/29/2018 1300   SDES PLEURAL 07/29/2018 1300   SPECREQUEST NONE 07/29/2018 1300   SPECREQUEST NONE 07/29/2018 1300   CULT  07/29/2018 1300    NO GROWTH 5 DAYS Performed at [facility], [place] [address] [place], [place] [address]    REPTSTATUS 08/03/2018 FINAL 07/29/2018 1300   REPTSTATUS 07/29/2018 FINAL 07/29/2018 1300     Physical Exam General: Ill appearing, nad Heart: RR Lungs: Clear, occasional wheezes Abdomen: soft, non-tender Extremities: trace LE edema Dialysis Access: L IJ TDC and L AVF +bruit   Medications:  . amLODipine  10 mg Oral Daily  . azithromycin  250 mg Oral Daily  . benzocaine   Mouth/Throat BID AC  . carvedilol  25 mg Oral BID  . Chlorhexidine Gluconate Cloth  6 each Topical Q0600  . darbepoetin (ARANESP) injection - DIALYSIS  200 mcg Intravenous Q Wed-HD  .  enoxaparin (LOVENOX) injection  30 mg Subcutaneous Daily  . feeding supplement (PRO-STAT SUGAR FREE 64)  30 mL Oral BID  . hydrALAZINE  100 mg Oral TID  . lanthanum  1,000 mg Oral TID WC  . lubriderm seriously sensitive   Topical Daily  . multivitamin  1 tablet Oral QHS  . pantoprazole  40 mg Oral BID AC  . predniSONE  40 mg Oral Q breakfast  . senna-docusate  2 tablet Oral QHS    Dialysis Orders: [facility] MWF 4h 400/800 EDW 792/2 bath LFA AVF (healing post-revision)/ L IJ TDC Hep none Na thiosulfate 25 since February 2021 for RUE wound - started during [facility] adm Mircera 200 - last 6/23 venofer 100 x 10 start 6/30 Na thio 25 gm q HD  [facility] HD 6/28 net UF 3.6 to post wt 84.8  Assessment/Plan: 1. SOB/ pulm edema/ vol overload/ diffuse LE edema. HD on 7/3, 7/5. With 6L removed. Now below outpatient dry weight. Continue to challenge EDW as tolerated.  2. Exacerbation of COPD - meds per  primary -imrpoved 3. ESRD - on HD MWF. Next HD 7/7.  4. HD access - recent AVF surgery. Using [facility] now 5. Chronic abd pain - affecting some of his dialysis, gets worse w/ dialysis per pt. May be narcotic dependent, has oxy IR at home.  6. H/o chronic pancreatitis/abdominal pain 7. HTN - longstanding - current meds and volume removal - need standing wts 8. OSA - on CPAP 9. Anemia of CKD- Hb 8.9. 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  Have not resumed during hospitalization. Discontinued.   Resumed Fosrenol. 11. Nutrition - alb low - add prostat to renal diet/vits  Lynnda Child PA-C [facility] 01/26/2020,10:05 AM   Nephrology attending: Seen and examined.  Chart reviewed.  I agree with assessment plan as outlined above. Plan for regular dialysis today, we will  attempt more UF if tolerated by BP.  Continue current medication. Okay to discharge from renal perspective.  Katheran James, MD [facility]

## 2020-01-26 NOTE — Progress Notes (Signed)
RT visited patients room to place patient on CPAP.  Patient was not in room.  Bed was gone.  RT will check back later and place patient on.

## 2020-01-27 DIAGNOSIS — I5023 Acute on chronic systolic (congestive) heart failure: Secondary | ICD-10-CM

## 2020-01-27 LAB — RENAL FUNCTION PANEL
Albumin: 2.7 g/dL — ABNORMAL LOW (ref 3.5–5.0)
Anion gap: 7 (ref 5–15)
BUN: 20 mg/dL (ref 6–20)
CO2: 28 mmol/L (ref 22–32)
Calcium: 7.7 mg/dL — ABNORMAL LOW (ref 8.9–10.3)
Chloride: 102 mmol/L (ref 98–111)
Creatinine, Ser: 3.53 mg/dL — ABNORMAL HIGH (ref 0.61–1.24)
GFR calc Af Amer: 21 mL/min — ABNORMAL LOW (ref >60)
GFR calc non Af Amer: 18 mL/min — ABNORMAL LOW (ref >60)
Glucose, Bld: 120 mg/dL — ABNORMAL HIGH (ref 70–99)
Phosphorus: 1.3 mg/dL — ABNORMAL LOW (ref 2.5–4.6)
Potassium: 3 mmol/L — ABNORMAL LOW (ref 3.5–5.1)
Sodium: 137 mmol/L (ref 135–145)

## 2020-01-27 LAB — CBC
HCT: 25.9 % — ABNORMAL LOW (ref 39.0–52.0)
Hemoglobin: 8 g/dL — ABNORMAL LOW (ref 13.0–17.0)
MCH: 26.8 pg (ref 26.0–34.0)
MCHC: 30.9 g/dL (ref 30.0–36.0)
MCV: 86.9 fL (ref 80.0–100.0)
Platelets: 160 10*3/uL (ref 150–400)
RBC: 2.98 MIL/uL — ABNORMAL LOW (ref 4.22–5.81)
RDW: 18.3 % — ABNORMAL HIGH (ref 11.5–15.5)
WBC: 6.5 10*3/uL (ref 4.0–10.5)
nRBC: 0 % (ref 0.0–0.2)

## 2020-01-27 MED ORDER — IPRATROPIUM-ALBUTEROL 0.5-2.5 (3) MG/3ML IN SOLN
3.0000 mL | Freq: Two times a day (BID) | RESPIRATORY_TRACT | Status: DC
  Administered 2020-01-27 – 2020-01-28 (×2): 3 mL via RESPIRATORY_TRACT
  Filled 2020-01-27 (×3): qty 3

## 2020-01-27 MED ORDER — HEPARIN SODIUM (PORCINE) 1000 UNIT/ML IJ SOLN
INTRAMUSCULAR | Status: AC
  Administered 2020-01-27: 5000 [IU]
  Filled 2020-01-27: qty 5

## 2020-01-27 NOTE — Progress Notes (Addendum)
[facility] Progress Note   Subjective:   Completed dialysis early this am -2.3L UF Feels like didn't get enough fluid off yesterday.  C/o SOB, abd discomfort/bloating.   Objective Vitals:   01/27/20 0330 01/27/20 0455 01/27/20 0754 01/27/20 0818  BP: 126/80 (!) 144/84 (!) 154/74   Pulse: 66 66  68  Resp: _0 Temp: 98.3 F (36.8 C)     TempSrc: Oral     SpO2: 98% 97%  100%  Weight: 81.4 kg     Height:         Additional Objective Labs: Basic Metabolic Panel: Recent Labs  Lab 01/23/20 0211 01/23/20 0356 01/24/20 1344  NA 137 136 137  K 3.3* 3.5 4.4  CL 99 100 100  CO2 _1 GLUCOSE 74 117* 205*  BUN 17 20 39*  CREATININE 5.14* 6.50* 9.70*  CALCIUM 8.0* 8.1* 7.4*  PHOS 3.0 4.0 4.6   CBC: Recent Labs  Lab 01/22/20 1544 01/22/20 1544 01/23/20 0356 01/23/20 0356 01/24/20 1343 01/25/20 0833 01/27/20 0315  WBC 3.9*   < > 3.3*   < > 4.9 5.5 6.5  NEUTROABS 2.8  --   --   --   --   --   --   HGB 8.7*   < > 8.1*   < > 8.0* 8.9* 8.0*  HCT 28.9*   < > 26.3*   < > 26.5* 29.0* 25.9*  MCV 88.9  --  87.7  --  88.6 89.8 86.9  PLT 107*   < > 123*   < > 149* 138* 160   < > = values in this interval not displayed.   Blood Culture    Component Value Date/Time   SDES PLEURAL 07/29/2018 1300   SDES PLEURAL 07/29/2018 1300   SPECREQUEST NONE 07/29/2018 1300   SPECREQUEST NONE 07/29/2018 1300   CULT  07/29/2018 1300    NO GROWTH 5 DAYS Performed at [facility], [place] [address] [place], [place] [address]    REPTSTATUS 08/03/2018 FINAL 07/29/2018 1300   REPTSTATUS 07/29/2018 FINAL 07/29/2018 1300     Physical Exam General: Ill appearing, nad Heart: RR Lungs: Clear, occasional wheezes Abdomen: soft, non-tender Extremities: trace LE edema Dialysis Access: L IJ TDC and L AVF +bruit   Medications: . sodium chloride    . sodium chloride     . amLODipine  10 mg Oral Daily  . benzocaine   Mouth/Throat BID AC  . carvedilol  25 mg  Oral BID  . Chlorhexidine Gluconate Cloth  6 each Topical Q0600  . Darbepoetin Alfa      . darbepoetin (ARANESP) injection - DIALYSIS  200 mcg Intravenous Q Wed-HD  . enoxaparin (LOVENOX) injection  30 mg Subcutaneous Daily  . feeding supplement (PRO-STAT SUGAR FREE 64)  30 mL Oral BID  . hydrALAZINE  100 mg Oral TID  . HYDROmorphone      . lanthanum  1,000 mg Oral TID WC  . lubriderm seriously sensitive   Topical Daily  . multivitamin  1 tablet Oral QHS  . pantoprazole  40 mg Oral BID AC  . senna-docusate  2 tablet Oral QHS    Dialysis Orders: [place] M[facility] 4h 400/800 EDW 792/2 bath LFA AVF (healing post-revision)/ L IJ TDC Hep none Na thiosulfate 25 since February 2021 for RUE wound - started during [facility] adm Mircera 200 - last 6/23 venofer 100 x 10 start 6/30 Na thio 25 gm q HD  Last incenter HD 6/28 net UF 3.6 to post wt 84.8  Assessment/Plan: 1. SOB/ pulm edema/ vol overload/ diffuse LE edema. HD on 7/3, 7/5, 7/7. Got below outpatient dry weight. Weights back up this am.  Continue to challenge EDW as tolerated.  2. Exacerbation of COPD - meds per primary -imrpoved 3. ESRD - on HD MWF. Next HD 7/9. Attempt  3.5-4L UF. Labs pending.  4. HD access - recent AVF surgery. Using [facility] now 5. Chronic abd pain - affecting some of his dialysis, gets worse w/ dialysis per pt. May be narcotic dependent, has oxy IR at home.  6. H/o chronic pancreatitis/abdominal pain -per primary  7. HTN - longstanding - current meds and volume removal - need standing wts 8. OSA - on CPAP 9. Anemia of CKD- Hb 8.0.  Aranesp  200 with HD 7/7.  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  Have not resumed during hospitalization. Discontinued.   Resumed Fosrenol. 11. Nutrition - alb low - add prostat to renal  diet/vits  Lynnda Child PA-C [facility] 01/27/2020,9:36 AM    Nephrology attending: Patient was seen and examined.  Chart reviewed.  I agree with assessment and plan as outlined above.   He had dialysis last night with only 2.3 L UF.  Complaining of pain during treatment and got IV Dilaudid by primary team.  He still looks volume up and having some shortness of breath.  Plan for extra treatment today for ultrafiltration, we will attempt 3 to 4 kg UF.  Katheran James, MD [facility].

## 2020-01-27 NOTE — Progress Notes (Signed)
RT offered to place pt on CPAP for the night and pt stated he would place himself on it later, that he did not want any help or need it that he was capable of doing it himself. RT expressed to pt if any difficulty to please call. Pt respiratory status is stable at this time on RA. Pt CPAP is set to 5 cmH2O w/no oxygen bled into the system. RT will continue to monitor.
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
## 2020-01-27 NOTE — Progress Notes (Signed)
Pulse oximetry room air is 95% on ambulation in patient's room. Pt refuses to ambulate further.

## 2020-01-27 NOTE — Plan of Care (Signed)
  Problem: Education: Goal: Knowledge of General Education information will improve Description: Including pain rating scale, medication(s)/side effects and non-pharmacologic comfort measures Outcome: Completed/Met   Problem: Health Behavior/Discharge Planning: Goal: Ability to manage health-related needs will improve Outcome: Completed/Met   Problem: Clinical Measurements: Goal: Ability to maintain clinical measurements within normal limits will improve Outcome: Completed/Met Goal: Will remain free from infection Outcome: Completed/Met Goal: Diagnostic test results will improve Outcome: Completed/Met   Problem: Activity: Goal: Risk for activity intolerance will decrease Outcome: Completed/Met   Problem: Coping: Goal: Level of anxiety will decrease Outcome: Completed/Met   Problem: Elimination: Goal: Will not experience complications related to bowel motility Outcome: Completed/Met Goal: Will not experience complications related to urinary retention Outcome: Completed/Met   Problem: Skin Integrity: Goal: Risk for impaired skin integrity will decrease Outcome: Completed/Met   Problem: Health Behavior/Discharge Planning: Goal: Ability to manage health-related needs will improve Outcome: Completed/Met

## 2020-01-27 NOTE — Plan of Care (Signed)
  Problem: Clinical Measurements: Goal: Respiratory complications will improve Outcome: Completed/Met Goal: Cardiovascular complication will be avoided Outcome: Completed/Met   Problem: Pain Managment: Goal: General experience of comfort will improve Outcome: Completed/Met   Problem: Safety: Goal: Ability to remain free from injury will improve Outcome: Completed/Met   Problem: Education: Goal: Knowledge of disease and its progression will improve Outcome: Completed/Met   Problem: Clinical Measurements: Goal: Complications related to the disease process, condition or treatment will be avoided or minimized Outcome: Completed/Met

## 2020-01-28 ENCOUNTER — Inpatient Hospital Stay (HOSPITAL_COMMUNITY): Payer: Medicare Other

## 2020-01-28 DIAGNOSIS — I5023 Acute on chronic systolic (congestive) heart failure: Secondary | ICD-10-CM

## 2020-01-28 LAB — RENAL FUNCTION PANEL
Albumin: 3 g/dL — ABNORMAL LOW (ref 3.5–5.0)
Anion gap: 12 (ref 5–15)
BUN: 54 mg/dL — ABNORMAL HIGH (ref 6–20)
CO2: 26 mmol/L (ref 22–32)
Calcium: 8.2 mg/dL — ABNORMAL LOW (ref 8.9–10.3)
Chloride: 102 mmol/L (ref 98–111)
Creatinine, Ser: 6.77 mg/dL — ABNORMAL HIGH (ref 0.61–1.24)
GFR calc Af Amer: 10 mL/min — ABNORMAL LOW (ref >60)
GFR calc non Af Amer: 8 mL/min — ABNORMAL LOW (ref >60)
Glucose, Bld: 152 mg/dL — ABNORMAL HIGH (ref 70–99)
Phosphorus: 2.5 mg/dL (ref 2.5–4.6)
Potassium: 3.4 mmol/L — ABNORMAL LOW (ref 3.5–5.1)
Sodium: 140 mmol/L (ref 135–145)

## 2020-01-28 LAB — CBC
HCT: 29 % — ABNORMAL LOW (ref 39.0–52.0)
Hemoglobin: 8.7 g/dL — ABNORMAL LOW (ref 13.0–17.0)
MCH: 27.1 pg (ref 26.0–34.0)
MCHC: 30 g/dL (ref 30.0–36.0)
MCV: 90.3 fL (ref 80.0–100.0)
Platelets: 195 10*3/uL (ref 150–400)
RBC: 3.21 MIL/uL — ABNORMAL LOW (ref 4.22–5.81)
RDW: 18.4 % — ABNORMAL HIGH (ref 11.5–15.5)
WBC: 6.9 10*3/uL (ref 4.0–10.5)
nRBC: 0 % (ref 0.0–0.2)

## 2020-01-28 MED ORDER — HYDROMORPHONE HCL 1 MG/ML IJ SOLN
1.0000 mg | Freq: Once | INTRAMUSCULAR | Status: AC
  Administered 2020-01-28: 1 mg via INTRAVENOUS

## 2020-01-28 MED ORDER — DARBEPOETIN ALFA 200 MCG/0.4ML IJ SOSY: 200.0000 ug | PREFILLED_SYRINGE | INTRAMUSCULAR | Status: DC

## 2020-01-28 MED ORDER — SODIUM CHLORIDE 0.9 % IV SOLN: 100.0000 mL | INTRAVENOUS | Status: DC | PRN

## 2020-01-28 MED ORDER — HYDROMORPHONE HCL 1 MG/ML IJ SOLN
INTRAMUSCULAR | Status: AC
  Filled 2020-01-28: qty 1

## 2020-01-28 MED ORDER — HEPARIN SODIUM (PORCINE) 1000 UNIT/ML DIALYSIS: 1000.0000 [IU] | INTRAMUSCULAR | Status: DC | PRN

## 2020-01-28 MED ORDER — HEPARIN SODIUM (PORCINE) 1000 UNIT/ML IJ SOLN
INTRAMUSCULAR | Status: AC
  Administered 2020-01-28: 5000 [IU] via INTRAVENOUS_CENTRAL
  Filled 2020-01-28: qty 5

## 2020-01-28 NOTE — TOC Initial Note (Signed)
Transition of Care [facility]) - Initial/Assessment Note    Patient Details  Name: Frank [name] MRN: [id number] Date of Birth: 1965/07/27  Transition of Care [facility]) CM/SW Contact:    Bartholomew Crews, RN Phone Number: [phone] 01/28/2020, 2:35 PM  Clinical Narrative:                  Spoke with patient at bedside. Confirmed receipt of DME nebulizer from [employer] yesterday. Patient asked about cpap. He verbalized that he has already had sleep study through [facility]. Advised that he should follow up with Romelle Starcher - patient verbalized understanding. Patient stated plans for discharge tomorrow. TOC following for transition needs.   Expected Discharge Plan: Home/Self Care Barriers to Discharge: Continued Medical Work up   Patient Goals and CMS Choice Patient states their goals for this hospitalization and ongoing recovery are:: patient states plans for dc home tomorrow CMS Medicare.gov Compare Post Acute Care list provided to:: Patient Choice offered to / list presented to : Patient  Expected Discharge Plan and Services Expected Discharge Plan: Home/Self Care In-house Referral: NA Discharge Planning Services: CM Consult Post Acute Care Choice: Durable Medical Equipment Living arrangements for the past 2 months: Single Family Home                 DME Arranged: Nebulizer machine DME Agency: [employer] Date DME Agency Contacted: 01/27/20 Time DME Agency Contacted: 1000 Representative spoke with at DME Agency: [place]: NA [place] Agency: NA        Prior Living Arrangements/Services Living arrangements for the past 2 months: [place] with:: Self Patient language and need for interpreter reviewed:: Yes        Need for Family Participation in Patient Care: No (Comment)     Criminal Activity/Legal Involvement Pertinent to Current Situation/Hospitalization: No - Comment as needed  Activities of Daily Living Home Assistive Devices/Equipment: CPAP ADL Screening  (condition at time of admission) Patient's cognitive ability adequate to safely complete daily activities?: Yes Is the patient deaf or have difficulty hearing?: No Does the patient have difficulty seeing, even when wearing glasses/contacts?: No Does the patient have difficulty concentrating, remembering, or making decisions?: No Patient able to express need for assistance with ADLs?: Yes Does the patient have difficulty dressing or bathing?: No Independently performs ADLs?: Yes (appropriate for developmental age) Does the patient have difficulty walking or climbing stairs?: No Weakness of Legs: None Weakness of Arms/Hands: None  Permission Sought/Granted                  Emotional Assessment Appearance:: Appears stated age Attitude/Demeanor/Rapport: Engaged Affect (typically observed): Accepting Orientation: : Oriented to Self, Oriented to  Time, Oriented to Place, Oriented to Situation Alcohol / Substance Use: Not Applicable Psych Involvement: No (comment)  Admission diagnosis:  Acute pulmonary edema (HCC) [J81.0] Thrombocytopenia (HCC) [D69.6] Chronic abdominal pain [R10.9, G89.29] Alkaline phosphatase elevation [R74.8] Anemia associated with chronic renal failure [N18.9, D63.1] End-stage renal disease on hemodialysis (HCC) [N18.6, Z99.2] Fluid overload [E87.70] Patient Active Problem List   Diagnosis Date Noted  . COPD with acute exacerbation ([place])   . Thrombocytopenia ([place])   . Alkaline phosphatase elevation   . Fluid overload 01/22/2020  . Left hip pain   . ESRD (end stage renal disease) ([place]) 07/19/2019  . GI bleed 06/17/2019  . Acute blood loss anemia 06/17/2019  . Acute pancreatitis 05/28/2019  . Hypertensive urgency 05/28/2019  . Uremia 05/17/2019  . Pancreatitis, acute 05/09/2019  . Intractable nausea  and vomiting 04/19/2019  . Chronic abdominal pain 04/12/2019  . Volume overload 03/11/2019  . Pneumothorax, right   . Malnutrition of moderate degree  07/29/2018  . Chest tube in place   . Chronic, continuous use of opioids 07/28/2018  . Chest pain   . Chronic vomiting 07/26/2018  . History of Clostridioides difficile infection 07/26/2018  . Empyema of right pleural space ([place]) 07/26/2018  . Chronic pancreatitis ([place]) 05/09/2018  . Foot pain, right 04/25/2018  . Dialysis patient, noncompliant ([place]) 03/05/2018  . DNR (do not resuscitate) discussion   . Hydropneumothorax 01/31/2018  . Hyperkalemia 01/25/2018  . PE (pulmonary thromboembolism) ([place]) 01/16/2018  . Benign hypertensive heart and kidney disease with systolic CHF, NYHA class 3 and CKD stage 5 ([place])   . End-stage renal disease on hemodialysis ([place])   . Cirrhosis ([place])   . Pancreatic pseudocyst   . Acute on chronic pancreatitis ([place]) 08/09/2017  . ESRD needing dialysis ([place]) 05/26/2017  . Marijuana abuse 04/21/2017  . History of DVT (deep vein thrombosis) 03/11/2017  . Aortic atherosclerosis ([place]) 01/05/2017  . GERD (gastroesophageal reflux disease) 05/29/2016  . Nonischemic cardiomyopathy ([place]) 01/09/2016  . Chronic pain   . Recurrent abdominal pain   . Left renal mass 10/30/2015  . Acute on chronic systolic congestive heart failure ([place]) 09/23/2015  . Acute respiratory failure with hypoxia ([place]) 09/08/2015  . Recurrent chest pain 09/08/2015  . Essential hypertension 01/02/2015  . Dyslipidemia   . Pulmonary hypertension ([place])   . Acute pulmonary edema (HCC)   . DM (diabetes mellitus), type 2, uncontrolled, with renal complications ([place])   . History of pulmonary embolism 05/08/2014  . Complex sleep apnea syndrome 05/05/2014  . Anemia associated with chronic renal failure 06/24/2013  . Nausea vomiting and diarrhea 06/24/2013   PCP:  Patient, [name] Pharmacy:   [id number] [place] [id number]: [phone]: [phone] [id number]  [address]: [phone]: [phone]   Social Determinants of Health (SDOH) Interventions    Readmission Risk Interventions No flowsheet data found.

## 2020-01-28 NOTE — Progress Notes (Signed)
  Echocardiogram 2D Echocardiogram has been performed.  Frank [name] 01/28/2020, 4:44 PM
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
## 2020-01-28 NOTE — Progress Notes (Signed)
[facility] Progress Note   Subjective:   Patient seen and examined at bedside during dialysis.  Complains of LE edema and 8/10 back/abdominal pain.  Denies n/v/d, CP, weakness, dizziness and fatigue.  Reports SOB is improving, still wearing O2. Tolerating dialysis well so far.  Not happy with his current dialysis center and requesting transfer to a different unit.   Objective Vitals:   01/28/20 0930 01/28/20 1000 01/28/20 1030 01/28/20 1100  BP: (!) 113/47 (!) 132/42 138/60 108/62  Pulse: 69 68 65 72  Resp:      Temp:      TempSrc:      SpO2:      Weight:      Height:       Physical Exam General:chronically ill appearing male in NAD Heart:RRR, no mrg appreciated  Lungs:CTAB anterolaterally, nml WOB with 2L O2 Abdomen:soft, +tenderness Extremities:trace LE edema, dry skin Dialysis Access: L IJ TDC accessed, L AVF +b   Filed Weights   01/26/20 2256 01/27/20 0330 01/28/20 0850  Weight: 84.7 kg 81.4 kg 85.8 kg    Intake/Output Summary (Last 24 hours) at 01/28/2020 1133 Last data filed at 01/28/2020 0600 Gross per 24 hour  Intake 1080 ml  Output 0 ml  Net 1080 ml    Additional Objective Labs: Basic Metabolic Panel: Recent Labs  Lab 01/24/20 1344 01/27/20 0819 01/28/20 0855  NA 137 137 140  K 4.4 3.0* 3.4*  CL 100 102 102  CO2 _0 GLUCOSE 205* 120* 152*  BUN 39* 20 54*  CREATININE 9.70* 3.53* 6.77*  CALCIUM 7.4* 7.7* 8.2*  PHOS 4.6 1.3* 2.5   Liver Function Tests: Recent Labs  Lab 01/22/20 1544 01/23/20 0211 01/24/20 1344 01/27/20 0819 01/28/20 0855  AST 26  --   --   --   --   ALT 13  --   --   --   --   ALKPHOS 203*  --   --   --   --   BILITOT 0.8  --   --   --   --   PROT 8.1  --   --   --   --   ALBUMIN 3.1*   < > 2.7* 2.7* 3.0*   < > = values in this interval not displayed.   CBC: Recent Labs  Lab 01/22/20 1544 01/22/20 1544 01/23/20 0356 01/23/20 0356 01/24/20 1343 01/24/20 1343 01/25/20 0833 01/27/20 0315  01/28/20 0855  WBC 3.9*   < > 3.3*   < > 4.9   < > 5.5 6.5 6.9  NEUTROABS 2.8  --   --   --   --   --   --   --   --   HGB 8.7*   < > 8.1*   < > 8.0*   < > 8.9* 8.0* 8.7*  HCT 28.9*   < > 26.3*   < > 26.5*   < > 29.0* 25.9* 29.0*  MCV 88.9   < > 87.7  --  88.6  --  89.8 86.9 90.3  PLT 107*   < > 123*   < > 149*   < > 138* 160 195   < > = values in this interval not displayed.   Medications: . sodium chloride    . sodium chloride    . [START ON 01/29/2020] sodium chloride    . [START ON 01/29/2020] sodium chloride     . amLODipine  10 mg Oral Daily  .  benzocaine   Mouth/Throat BID AC  . carvedilol  25 mg Oral BID  . Chlorhexidine Gluconate Cloth  6 each Topical Q0600  . [START ON 02/04/2020] darbepoetin (ARANESP) injection - DIALYSIS  200 mcg Intravenous Q Fri-HD  . enoxaparin (LOVENOX) injection  30 mg Subcutaneous Daily  . feeding supplement (PRO-STAT SUGAR FREE 64)  30 mL Oral BID  . hydrALAZINE  100 mg Oral TID  . ipratropium-albuterol  3 mL Nebulization BID  . lanthanum  1,000 mg Oral TID WC  . lubriderm seriously sensitive   Topical Daily  . multivitamin  1 tablet Oral QHS  . pantoprazole  40 mg Oral BID AC  . senna-docusate  2 tablet Oral QHS    Dialysis Orders: [facility] M[facility] 4h 400/800 EDW 792/2 bath LFA AVF (healing post-revision)/ L IJ TDC Hep none Na thiosulfate 25 since February 2021 for RUE wound - started during [facility] adm Mircera 200 - last 6/23 venofer 100 x 10 start 6/30 Na thio 25 gm q HD  Last incenter HD 6/28 net UF 3.6 to post wt 84.8  Assessment/Plan: 1. SOB/ pulm edema/ vol overload/ diffuse LE edema. HD on 7/3, 7/5, 7/7, 7/9. Got below outpatient dry weight over the weekend but they have been trending back up. 3L gain since yesterday if weights correct. Reinforce fluid restriction and continue to challenge EDW as tolerated.  2. Exacerbation of COPD - meds per primary - improved. 3. ESRD - on HD MWF. HD today per regular schedule with net UF goal 4L.  K  3.4, use 3K bath.  4. HD access - recent AVF surgery. Using [facility] now 5. Chronic abd pain - affecting some of his dialysis, gets worse w/ dialysis per pt.  Tolerating so far today. May be narcotic dependent, has oxy IR at home.  6. H/o chronic pancreatitis/abdominal pain -per primary  7. HTN - longstanding - in goal today. Continue current meds and volume removal as tolerated.  8. OSA - on CPAP 9. Anemia of CKD- Hb 8.0>8.7.  Aranesp  200 with HD 7/7.  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 Have not resumed during hospitalization. Discontinued. ResumedFosrenol.  Not on VDRA. 11. Nutrition - alb low - add prostat to renal diet/vits  Jen Mow, PA-C [facility] Pager: [phone] 01/28/2020,11:33 AM  LOS: 6 days

## 2020-01-28 NOTE — Progress Notes (Signed)
PT Cancellation Note  Patient Details Name: Frank [name] MRN: [id number] DOB: Aug 28, 1965   Cancelled Treatment:    Reason Eval/Treat Not Completed: Patient at procedure or test/unavailable Pt off floor at HD. Will follow.   [name] 01/28/2020, 9:24 AM [name], PT, DPT Acute Rehabilitation Services Pager [phone] Office [phone]

## 2020-01-28 NOTE — Procedures (Signed)
Echo attempted. Patient eating. Will attempt again later.

## 2020-01-28 NOTE — Plan of Care (Signed)
  Problem: Nutrition: Goal: Adequate nutrition will be maintained Outcome: Progressing

## 2020-01-29 ENCOUNTER — Other Ambulatory Visit: Payer: Self-pay | Admitting: Cardiology

## 2020-01-29 DIAGNOSIS — I3131 Malignant pericardial effusion in diseases classified elsewhere: Secondary | ICD-10-CM

## 2020-01-29 MED ORDER — HYDROMORPHONE HCL 2 MG PO TABS
1.0000 mg | ORAL_TABLET | Freq: Once | ORAL | Status: AC
  Administered 2020-01-29: 1 mg via ORAL
  Filled 2020-01-29: qty 1

## 2020-01-29 MED ORDER — ALUM & MAG HYDROXIDE-SIMETH 200-200-20 MG/5ML PO SUSP
30.0000 mL | Freq: Once | ORAL | Status: DC
  Filled 2020-01-29: qty 30

## 2020-01-29 MED ORDER — LIDOCAINE VISCOUS HCL 2 % MT SOLN
15.0000 mL | Freq: Once | OROMUCOSAL | Status: AC
  Administered 2020-01-29: 15 mL via ORAL
  Filled 2020-01-29: qty 15

## 2020-01-29 NOTE — Progress Notes (Addendum)
[facility] Progress Note   Subjective:   Patient seen and examined at bedside.  Reports breathing and volume status improving but remains SOB this AM and believes he may need an extra treatment.  Abdominal pain improved.  Denies CP, and n/v/d. Tolerated part of his breakfast this AM.  Back pain is ongoing.    Objective Vitals:   01/28/20 1527 01/28/20 2043 01/28/20 2049 01/29/20 0527  BP: (!) 146/104 (!) 141/50  (!) 187/80  Pulse:  73 67 61  Resp:  _0 Temp:  97.8 F (36.6 C)  (!) 97.5 F (36.4 C)  TempSrc:  Oral  Oral  SpO2:  100% 97% 96%  Weight:      Height:       Physical Exam General:NAD, chronically ill appearing male Heart:RRR Lungs:+rhonchi in lower lobes, nml WOB on RA Abdomen:soft, NTND Extremities:trace LE edema, dry skin Dialysis Access: L IJ TDC, LU AVF   Filed Weights   01/27/20 0330 01/28/20 0850 01/28/20 1258  Weight: 81.4 kg 85.8 kg 81.6 kg    Intake/Output Summary (Last 24 hours) at 01/29/2020 1131 Last data filed at 01/29/2020 0600 Gross per 24 hour  Intake 1080 ml  Output 4000 ml  Net -2920 ml    Additional Objective Labs: Basic Metabolic Panel: Recent Labs  Lab 01/24/20 1344 01/27/20 0819 01/28/20 0855  NA 137 137 140  K 4.4 3.0* 3.4*  CL 100 102 102  CO2 _1 GLUCOSE 205* 120* 152*  BUN 39* 20 54*  CREATININE 9.70* 3.53* 6.77*  CALCIUM 7.4* 7.7* 8.2*  PHOS 4.6 1.3* 2.5   Liver Function Tests: Recent Labs  Lab 01/22/20 1544 01/23/20 0211 01/24/20 1344 01/27/20 0819 01/28/20 0855  AST 26  --   --   --   --   ALT 13  --   --   --   --   ALKPHOS 203*  --   --   --   --   BILITOT 0.8  --   --   --   --   PROT 8.1  --   --   --   --   ALBUMIN 3.1*   < > 2.7* 2.7* 3.0*   < > = values in this interval not displayed.   CBC: Recent Labs  Lab 01/22/20 1544 01/22/20 1544 01/23/20 0356 01/23/20 0356 01/24/20 1343 01/24/20 1343 01/25/20 0833 01/27/20 0315 01/28/20 0855  WBC 3.9*   < > 3.3*   < >  4.9   < > 5.5 6.5 6.9  NEUTROABS 2.8  --   --   --   --   --   --   --   --   HGB 8.7*   < > 8.1*   < > 8.0*   < > 8.9* 8.0* 8.7*  HCT 28.9*   < > 26.3*   < > 26.5*   < > 29.0* 25.9* 29.0*  MCV 88.9   < > 87.7  --  88.6  --  89.8 86.9 90.3  PLT 107*   < > 123*   < > 149*   < > 138* 160 195   < > = values in this interval not displayed.   Studies/Results: ECHOCARDIOGRAM COMPLETE  Result Date: 01/28/2020    ECHOCARDIOGRAM REPORT   Patient Name:   Frank [name] Date of Exam: 01/28/2020 Medical Rec #:  [id number]    Height:       74.0 in  Accession #:    [id number]   Weight:       179.9 lb Date of Birth:  Sep 28, 1965     BSA:          2.078 m Patient Age:    54 years     BP:           118/75 mmHg Patient Gender: M            HR:           76 bpm. Exam Location:  Inpatient Procedure: 2D Echo, Cardiac Doppler and Color Doppler Indications:    CHF (congestive heart failure)  History:        Patient has prior history of Echocardiogram examinations, most                 recent 11/10/2017. COPD; Risk Factors:Hypertension, Dyslipidemia                 and Diabetes. Nonischemic cardiomyopathy. ESRD. H/o DVT.  Sonographer:    Clayton Lefort RDCS (AE) Referring Phys: [place]  1. Left ventricular ejection fraction, by estimation, is 45% with inferoseptal and basal inferior hypokinesis. COmpared to echo from 2019, LVEF is improved. There is severe left ventricular hypertrophy. Left ventricular diastolic parameters are indeterminate.  2. Right ventricular systolic function is moderately reduced. The right ventricular size is normal. There is moderately elevated pulmonary artery systolic pressure.  3. Large pericardial effusion surrounds heart. Largest posteriorly (29 mm) and laterally No RV/RA collapse, no significant change in mitral inflow with respiration. IVC is dilated with minimal respiratory variation. Overall does not meet full criteria for tamponade by echo criteria. Clinical correlation indicated.  Compared to echo from 2019, pericardial effusion is larger . Large pericardial effusion.  4. The mitral valve is normal in structure. No evidence of mitral valve regurgitation.  5. The aortic valve is grossly normal. Aortic valve regurgitation is not visualized.  6. The inferior vena cava is dilated in size with <50% respiratory variability, suggesting right atrial pressure of 15 mmHg. FINDINGS  Left Ventricle: Left ventricular ejection fraction, by estimation, is 45%. Compared to echo from 2019, LVEF is improved. The left ventricular internal cavity size was normal in size. There is severe left ventricular hypertrophy. Left ventricular diastolic parameters are indeterminate. Right Ventricle: The right ventricular size is normal. Right vetricular wall thickness was not assessed. Right ventricular systolic function is moderately reduced. There is moderately elevated pulmonary artery systolic pressure. The tricuspid regurgitant  velocity is 2.88 m/s, and with an assumed right atrial pressure of 15 mmHg, the estimated right ventricular systolic pressure is 16.9 mmHg. Left Atrium: Left atrial size was normal in size. Right Atrium: Right atrial size was normal in size. Pericardium: Large pericardial effusion surrounds heart. Largest posteriorly (29 mm) and laterally No RV/RA collapse, no significant change in mitral inflow with respiration. IVC is dilated with minimal respiratory variation. Overall does not meet full criteria for tamponade by echo criteria. Clinical correlation indicated. Compared to echo from 2019, pericardial effusion is larger. A large pericardial effusion is present. Mitral Valve: The mitral valve is normal in structure. No evidence of mitral valve regurgitation. MV peak gradient, 5.8 mmHg. The mean mitral valve gradient is 2.0 mmHg. Tricuspid Valve: The tricuspid valve is normal in structure. Tricuspid valve regurgitation is trivial. Aortic Valve: The aortic valve is grossly normal. Aortic valve  regurgitation is not visualized. Aortic valve mean gradient measures 7.0 mmHg. Aortic valve peak gradient measures  13.4 mmHg. Aortic valve area, by VTI measures 3.37 cm. Pulmonic Valve: The pulmonic valve was normal in structure. Pulmonic valve regurgitation is trivial. Aorta: The aortic root is normal in size and structure. Venous: The inferior vena cava is dilated in size with less than 50% respiratory variability, suggesting right atrial pressure of 15 mmHg. IAS/Shunts: No atrial level shunt detected by color flow Doppler.  LEFT VENTRICLE PLAX 2D LVIDd:         4.80 cm      Diastology LVIDs:         3.30 cm      LV e' lateral:   8.16 cm/s LV PW:         2.00 cm      LV E/e' lateral: 10.2 LV IVS:        1.80 cm      LV e' medial:    5.87 cm/s LVOT diam:     2.50 cm      LV E/e' medial:  14.2 LV SV:         100 LV SV Index:   48 LVOT Area:     4.91 cm  LV Volumes (MOD) LV vol d, MOD A2C: 123.0 ml LV vol d, MOD A4C: 126.0 ml LV vol s, MOD A2C: 55.3 ml LV vol s, MOD A4C: 70.9 ml LV SV MOD A2C:     67.7 ml LV SV MOD A4C:     126.0 ml LV SV MOD BP:      67.7 ml RIGHT VENTRICLE             IVC RV Basal diam:  3.20 cm     IVC diam: 2.30 cm RV S prime:     11.70 cm/s TAPSE (M-mode): 1.3 cm LEFT ATRIUM           Index       RIGHT ATRIUM           Index LA diam:      4.00 cm 1.93 cm/m  RA Area:     22.40 cm LA Vol (A4C): 63.7 ml 30.66 ml/m RA Volume:   61.20 ml  29.45 ml/m  AORTIC VALVE AV Area (Vmax):    3.17 cm AV Area (Vmean):   2.95 cm AV Area (VTI):     3.37 cm AV Vmax:           183.00 cm/s AV Vmean:          127.000 cm/s AV VTI:            0.296 m AV Peak Grad:      13.4 mmHg AV Mean Grad:      7.0 mmHg LVOT Vmax:         118.00 cm/s LVOT Vmean:        76.400 cm/s LVOT VTI:          0.203 m LVOT/AV VTI ratio: 0.69  AORTA Ao Root diam: 3.70 cm Ao Asc diam:  3.30 cm MITRAL VALVE               TRICUSPID VALVE MV Area (PHT): 3.31 cm    TR Peak grad:   33.2 mmHg MV Peak grad:  5.8 mmHg    TR Vmax:        288.00  cm/s MV Mean grad:  2.0 mmHg MV Vmax:       1.20 m/s    SHUNTS MV Vmean:      63.0 cm/s   Systemic  VTI:  0.20 m MV Decel Time: 229 msec    Systemic Diam: 2.50 cm MV E velocity: 83.10 cm/s MV A velocity: 49.30 cm/s MV E/A ratio:  1.69 Dorris Carnes MD Electronically signed by Dorris Carnes MD Signature Date/Time: 01/28/2020/8:34:22 PM    Final     Medications:  . amLODipine  10 mg Oral Daily  . benzocaine   Mouth/Throat BID AC  . carvedilol  25 mg Oral BID  . Chlorhexidine Gluconate Cloth  6 each Topical Q0600  . [START ON 02/04/2020] darbepoetin (ARANESP) injection - DIALYSIS  200 mcg Intravenous Q Fri-HD  . enoxaparin (LOVENOX) injection  30 mg Subcutaneous Daily  . feeding supplement (PRO-STAT SUGAR FREE 64)  30 mL Oral BID  . hydrALAZINE  100 mg Oral TID  . lanthanum  1,000 mg Oral TID WC  . lubriderm seriously sensitive   Topical Daily  . multivitamin  1 tablet Oral QHS  . pantoprazole  40 mg Oral BID AC  . senna-docusate  2 tablet Oral QHS    Dialysis Orders: [facility] MWF 4h 400/800 EDW 792/2 bath LFA AVF (healing post-revision)/ L IJ TDC Hep none Na thiosulfate 25 since February 2021 for RUE wound - started during [facility] adm Mircera 200 - last 6/23 venofer 100 x 10 start 6/30 Na thio 25 gm q HD  Last incenter HD 6/28 net UF 3.6 to post wt 84.8  Assessment/Plan: 1. SOB/ pulm edema/ vol overload/ diffuse LE edema. HD on 7/3, 7/5, 7/7, 7/9.Gotbelow outpatient dry weight over the weekend but they have been trending back up. Large gains, reinforce fluid restriction and continue to challenge EDW as tolerated. Normal WOB on RA currently.  2. Exacerbation of COPD - meds per primary - improved. 3. ESRD - on HD MWF.HD yesterday with total 4L removed.  No urgent indications for HD at this time.  last K 3.4, use 3K bath. Reinforce compliance with OP dialysis.  4. HD access - recent AVF surgery (not done here). Using [facility] now 5. Chronic abd pain - improved today.  affecting some of his dialysis,  gets worse w/ dialysis per pt.  May be narcotic dependent, has oxy IR at home.  6. H/o chronic pancreatitis/abdominal pain-per primary 7. HTN - longstanding - Elevated today.  Continue current meds and volume removal as tolerated.  8. OSA - on CPAP 9. Anemia of CKD- Hb 8.0>8.7.Aranesp 200 with HD 7/7. 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 Have not resumed during hospitalization. Discontinued. ResumedFosrenol.  Not on VDRA. 11. Nutrition - alb low - add prostat to renal diet/vits   Jen Mow, PA-C [facility] Pager: [phone] 01/29/2020,11:31 AM  LOS: 7 days

## 2020-01-29 NOTE — Progress Notes (Signed)
RT offered to place pt on CPAP for the night and pt refused. RT will continue to monitor.

## 2020-01-29 NOTE — Progress Notes (Signed)
RN paged regarding patient's pain control.  Patient requesting dilaudid but refusing his PRN oxycodone.    Spoke with patient, he is agreeable to take oxycodone and let RN know to if pain medication is not working. Spoke with RN, if pain not controlled with oxycodone will trial po Dilaudid as patient does not have IV.   Lyndee Hensen, DO PGY-2, [facility] 01/29/2020 11:09 PM
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
## 2020-01-29 NOTE — Plan of Care (Signed)
  Problem: Fluid Volume: Goal: Compliance with measures to maintain balanced fluid volume will improve Outcome: Progressing

## 2020-01-30 ENCOUNTER — Inpatient Hospital Stay (HOSPITAL_COMMUNITY): Payer: Medicare Other

## 2020-01-30 LAB — COMPREHENSIVE METABOLIC PANEL
ALT: 25 U/L (ref 0–44)
AST: 26 U/L (ref 15–41)
Albumin: 3.3 g/dL — ABNORMAL LOW (ref 3.5–5.0)
Alkaline Phosphatase: 226 U/L — ABNORMAL HIGH (ref 38–126)
Anion gap: 15 (ref 5–15)
BUN: 78 mg/dL — ABNORMAL HIGH (ref 6–20)
CO2: 21 mmol/L — ABNORMAL LOW (ref 22–32)
Calcium: 8.8 mg/dL — ABNORMAL LOW (ref 8.9–10.3)
Chloride: 101 mmol/L (ref 98–111)
Creatinine, Ser: 8.22 mg/dL — ABNORMAL HIGH (ref 0.61–1.24)
GFR calc Af Amer: 8 mL/min — ABNORMAL LOW (ref >60)
GFR calc non Af Amer: 7 mL/min — ABNORMAL LOW (ref >60)
Glucose, Bld: 107 mg/dL — ABNORMAL HIGH (ref 70–99)
Potassium: 4.2 mmol/L (ref 3.5–5.1)
Sodium: 137 mmol/L (ref 135–145)
Total Bilirubin: 0.8 mg/dL (ref 0.3–1.2)
Total Protein: 8.2 g/dL — ABNORMAL HIGH (ref 6.5–8.1)

## 2020-01-30 LAB — LIPASE, BLOOD: Lipase: 55 U/L — ABNORMAL HIGH (ref 11–51)

## 2020-01-30 MED ORDER — HYDROMORPHONE HCL 2 MG PO TABS
1.0000 mg | ORAL_TABLET | Freq: Once | ORAL | Status: AC
  Administered 2020-01-30: 1 mg via ORAL
  Filled 2020-01-30: qty 1

## 2020-01-30 MED ORDER — HYDROMORPHONE HCL 1 MG/ML IJ SOLN
1.0000 mg | Freq: Four times a day (QID) | INTRAMUSCULAR | Status: AC | PRN
  Administered 2020-01-30 – 2020-01-31 (×3): 1 mg via INTRAMUSCULAR
  Filled 2020-01-30 (×3): qty 1

## 2020-01-30 MED ORDER — CHLORHEXIDINE GLUCONATE CLOTH 2 % EX PADS: 6.0000 | MEDICATED_PAD | Freq: Every day | CUTANEOUS | Status: DC

## 2020-01-30 MED ORDER — HYDROMORPHONE HCL 2 MG PO TABS
1.0000 mg | ORAL_TABLET | Freq: Once | ORAL | Status: DC
  Filled 2020-01-30: qty 1

## 2020-01-30 MED ORDER — HYDROMORPHONE HCL 1 MG/ML IJ SOLN
1.0000 mg | Freq: Once | INTRAMUSCULAR | Status: AC
  Administered 2020-01-30: 1 mg via INTRAMUSCULAR
  Filled 2020-01-30: qty 1

## 2020-01-30 NOTE — Progress Notes (Signed)
FPTS Interim Progress Note  S: Patient was refusing all meds with the exception of pain meds. Patient has refused his HTN meds the past 36 hrs.Patient continued to have chronic abdominal pain was focused on getting medications for this. Patient was also nervous about going home with his chronic pain and chronic illnesses.  O: BP (!) 158/80   Pulse 72   Temp 97.7 F (36.5 C) (Oral)   Resp 16   Ht _0  (1.88 m)   Wt 81.6 kg   SpO2 100%   BMI 23.10 kg/m   General: Patient was resting comfortably in bed Resp: breathing on room air with no extra work of breathing noted. GI: Sore CV: Moderate edema of the legs  A/P: After speaking with the patient about the importance of his hypertensive meds and ESRD medications the patient was willing to take his medications. I also spoke with the patient about his reported anxiety about going home. The patient confirmed that he was anxious about going home and wants to get a "fresh start" on his health. He recognizes that he comes to the ED frequently and would like to come less and go to a PCP more often for his chronic problems. The patient reported that he appreciates the CPAP and believes that he will get better sleep when he returns home, which will help him feel better overall. The patient also acknowledges that he has ongoing anxiety about being alone and his health and is open to seeing a psychiatrist or counselor about his anxiety. He reports that he did see mental health in the past and felt better when following with them.  - Continue to take chronic home medications - Suggested to patient about following up at the [facility], MD 01/30/2020, 8:38 PM PGY-1, [place] Medicine Service pager [phone]

## 2020-01-30 NOTE — Progress Notes (Signed)
Patient refusing lab draw.Dr.McQuilla notified.

## 2020-01-30 NOTE — Progress Notes (Addendum)
Patient asking for the pain medicine, MD had ordered Dilaudid 1 mg PO and upon dispensing him the Dilaudid he refused to take it. Patient insisting that it was not the pain med the MD ordered. Explained to patient that it was just ordered this a.m. but patient tells me "What is the thing in ENGLISH that you don't understand" Patient was scheduled also for CT of abdomen and pelvis with contrast and with CT Tech at bedside instructing him to drink the contrast. Patient refused to have the contrast. CT Tech to inform MD of patient refusal to have the contrast. MD paged  to inform of patient refusal of ordered Dilaudid and CT contrast. Darrelyn Hillock MD came by and saw patient at bedside.

## 2020-01-30 NOTE — Progress Notes (Signed)
CT abdomen read and labs just returned.  Fortunately no acute findings within abdomen and CMP/lipase at baseline.  He reports still being in a great deal of pain.  Will provide additional relief overnight and discharge home in the morning.  He has dialysis at his outpatient center at [place], DO

## 2020-01-30 NOTE — Progress Notes (Signed)
[facility] Progress Note   Subjective:   Patient seen and examined at bedside.  Reports abdominal pain and distention.  Denies n/v/d.  Feeling a little more SOB today.  Stressed importance of fluid restrictions.  Upset he did not have dialysis overnight.  Explained with high patient census, patients must be prioritized based on clinical acuity.  He acknowledged understanding.    Objective Vitals:   01/29/20 2129 01/30/20 0327 01/30/20 0500 01/30/20 1200  BP: (!) 155/46  (!) 163/82   Pulse: 79 76 70   Resp: _0 Temp: 98 F (36.7 C)  97.7 F (36.5 C)   TempSrc: Oral  Oral   SpO2: 100% 98% 100% 100%  Weight:      Height:       Physical Exam General:chronically ill appearing male in NAD Heart:RRR Lungs:+rhonchi in lower lobes, nml WOB on RA Abdomen:soft, +tenderness, +distended Extremities:trace LE edema, dry skin Dialysis Access: L IJ TDC, LU AVF   Filed Weights   01/27/20 0330 01/28/20 0850 01/28/20 1258  Weight: 81.4 kg 85.8 kg 81.6 kg    Intake/Output Summary (Last 24 hours) at 01/30/2020 1251 Last data filed at 01/30/2020 0500 Gross per 24 hour  Intake 360 ml  Output 0 ml  Net 360 ml    Additional Objective Labs: Basic Metabolic Panel: Recent Labs  Lab 01/24/20 1344 01/27/20 0819 01/28/20 0855  NA 137 137 140  K 4.4 3.0* 3.4*  CL 100 102 102  CO2 _1 GLUCOSE 205* 120* 152*  BUN 39* 20 54*  CREATININE 9.70* 3.53* 6.77*  CALCIUM 7.4* 7.7* 8.2*  PHOS 4.6 1.3* 2.5   Liver Function Tests: Recent Labs  Lab 01/24/20 1344 01/27/20 0819 01/28/20 0855  ALBUMIN 2.7* 2.7* 3.0*   No results for input(s): LIPASE, AMYLASE in the last 168 hours. CBC: Recent Labs  Lab 01/24/20 1343 01/24/20 1343 01/25/20 0833 01/27/20 0315 01/28/20 0855  WBC 4.9   < > 5.5 6.5 6.9  HGB 8.0*   < > 8.9* 8.0* 8.7*  HCT 26.5*   < > 29.0* 25.9* 29.0*  MCV 88.6  --  89.8 86.9 90.3  PLT 149*   < > 138* 160 195   < > = values in this interval not  displayed.   Studies/Results: ECHOCARDIOGRAM COMPLETE  Result Date: 01/28/2020    ECHOCARDIOGRAM REPORT   Patient Name:   Frank [name] Date of Exam: 01/28/2020 Medical Rec #:  [id number]    Height:       74.0 in Accession #:    [id number]   Weight:       179.9 lb Date of Birth:  07/11/1966     BSA:          2.078 m Patient Age:    54 years     BP:           118/75 mmHg Patient Gender: M            HR:           76 bpm. Exam Location:  Inpatient Procedure: 2D Echo, Cardiac Doppler and Color Doppler Indications:    CHF (congestive heart failure)  History:        Patient has prior history of Echocardiogram examinations, most                 recent 11/10/2017. COPD; Risk Factors:Hypertension, Dyslipidemia  and Diabetes. Nonischemic cardiomyopathy. ESRD. H/o DVT.  Sonographer:    Clayton Lefort RDCS (AE) Referring Phys: [place]  1. Left ventricular ejection fraction, by estimation, is 45% with inferoseptal and basal inferior hypokinesis. COmpared to echo from 2019, LVEF is improved. There is severe left ventricular hypertrophy. Left ventricular diastolic parameters are indeterminate.  2. Right ventricular systolic function is moderately reduced. The right ventricular size is normal. There is moderately elevated pulmonary artery systolic pressure.  3. Large pericardial effusion surrounds heart. Largest posteriorly (29 mm) and laterally No RV/RA collapse, no significant change in mitral inflow with respiration. IVC is dilated with minimal respiratory variation. Overall does not meet full criteria for tamponade by echo criteria. Clinical correlation indicated. Compared to echo from 2019, pericardial effusion is larger . Large pericardial effusion.  4. The mitral valve is normal in structure. No evidence of mitral valve regurgitation.  5. The aortic valve is grossly normal. Aortic valve regurgitation is not visualized.  6. The inferior vena cava is dilated in size with <50% respiratory  variability, suggesting right atrial pressure of 15 mmHg. FINDINGS  Left Ventricle: Left ventricular ejection fraction, by estimation, is 45%. Compared to echo from 2019, LVEF is improved. The left ventricular internal cavity size was normal in size. There is severe left ventricular hypertrophy. Left ventricular diastolic parameters are indeterminate. Right Ventricle: The right ventricular size is normal. Right vetricular wall thickness was not assessed. Right ventricular systolic function is moderately reduced. There is moderately elevated pulmonary artery systolic pressure. The tricuspid regurgitant  velocity is 2.88 m/s, and with an assumed right atrial pressure of 15 mmHg, the estimated right ventricular systolic pressure is 97.0 mmHg. Left Atrium: Left atrial size was normal in size. Right Atrium: Right atrial size was normal in size. Pericardium: Large pericardial effusion surrounds heart. Largest posteriorly (29 mm) and laterally No RV/RA collapse, no significant change in mitral inflow with respiration. IVC is dilated with minimal respiratory variation. Overall does not meet full criteria for tamponade by echo criteria. Clinical correlation indicated. Compared to echo from 2019, pericardial effusion is larger. A large pericardial effusion is present. Mitral Valve: The mitral valve is normal in structure. No evidence of mitral valve regurgitation. MV peak gradient, 5.8 mmHg. The mean mitral valve gradient is 2.0 mmHg. Tricuspid Valve: The tricuspid valve is normal in structure. Tricuspid valve regurgitation is trivial. Aortic Valve: The aortic valve is grossly normal. Aortic valve regurgitation is not visualized. Aortic valve mean gradient measures 7.0 mmHg. Aortic valve peak gradient measures 13.4 mmHg. Aortic valve area, by VTI measures 3.37 cm. Pulmonic Valve: The pulmonic valve was normal in structure. Pulmonic valve regurgitation is trivial. Aorta: The aortic root is normal in size and structure.  Venous: The inferior vena cava is dilated in size with less than 50% respiratory variability, suggesting right atrial pressure of 15 mmHg. IAS/Shunts: No atrial level shunt detected by color flow Doppler.  LEFT VENTRICLE PLAX 2D LVIDd:         4.80 cm      Diastology LVIDs:         3.30 cm      LV e' lateral:   8.16 cm/s LV PW:         2.00 cm      LV E/e' lateral: 10.2 LV IVS:        1.80 cm      LV e' medial:    5.87 cm/s LVOT diam:     2.50 cm  LV E/e' medial:  14.2 LV SV:         100 LV SV Index:   48 LVOT Area:     4.91 cm  LV Volumes (MOD) LV vol d, MOD A2C: 123.0 ml LV vol d, MOD A4C: 126.0 ml LV vol s, MOD A2C: 55.3 ml LV vol s, MOD A4C: 70.9 ml LV SV MOD A2C:     67.7 ml LV SV MOD A4C:     126.0 ml LV SV MOD BP:      67.7 ml RIGHT VENTRICLE             IVC RV Basal diam:  3.20 cm     IVC diam: 2.30 cm RV S prime:     11.70 cm/s TAPSE (M-mode): 1.3 cm LEFT ATRIUM           Index       RIGHT ATRIUM           Index LA diam:      4.00 cm 1.93 cm/m  RA Area:     22.40 cm LA Vol (A4C): 63.7 ml 30.66 ml/m RA Volume:   61.20 ml  29.45 ml/m  AORTIC VALVE AV Area (Vmax):    3.17 cm AV Area (Vmean):   2.95 cm AV Area (VTI):     3.37 cm AV Vmax:           183.00 cm/s AV Vmean:          127.000 cm/s AV VTI:            0.296 m AV Peak Grad:      13.4 mmHg AV Mean Grad:      7.0 mmHg LVOT Vmax:         118.00 cm/s LVOT Vmean:        76.400 cm/s LVOT VTI:          0.203 m LVOT/AV VTI ratio: 0.69  AORTA Ao Root diam: 3.70 cm Ao Asc diam:  3.30 cm MITRAL VALVE               TRICUSPID VALVE MV Area (PHT): 3.31 cm    TR Peak grad:   33.2 mmHg MV Peak grad:  5.8 mmHg    TR Vmax:        288.00 cm/s MV Mean grad:  2.0 mmHg MV Vmax:       1.20 m/s    SHUNTS MV Vmean:      63.0 cm/s   Systemic VTI:  0.20 m MV Decel Time: 229 msec    Systemic Diam: 2.50 cm MV E velocity: 83.10 cm/s MV A velocity: 49.30 cm/s MV E/A ratio:  1.69 Dorris Carnes MD Electronically signed by Dorris Carnes MD Signature Date/Time: 01/28/2020/8:34:22 PM     Final     Medications:  . alum & mag hydroxide-simeth  30 mL Oral Once  . amLODipine  10 mg Oral Daily  . benzocaine   Mouth/Throat BID AC  . carvedilol  25 mg Oral BID  . Chlorhexidine Gluconate Cloth  6 each Topical Q0600  . [START ON 02/04/2020] darbepoetin (ARANESP) injection - DIALYSIS  200 mcg Intravenous Q Fri-HD  . enoxaparin (LOVENOX) injection  30 mg Subcutaneous Daily  . feeding supplement (PRO-STAT SUGAR FREE 64)  30 mL Oral BID  . hydrALAZINE  100 mg Oral TID  . lanthanum  1,000 mg Oral TID WC  . lubriderm seriously sensitive   Topical Daily  . multivitamin  1 tablet  Oral QHS  . pantoprazole  40 mg Oral BID AC  . senna-docusate  2 tablet Oral QHS    Dialysis Orders: [facility] MWF 4h 400/800 EDW 792/2 bath LFA AVF (healing post-revision)/ L IJ TDC Hep none Na thiosulfate 25 since February 2021 for RUE wound - started during [facility] adm Mircera 200 - last 6/23 venofer 100 x 10 start 6/30 Na thio 25 gm q HD  Last incenter HD 6/28 net UF 3.6 to post wt 84.8  Assessment/Plan: 1. SOB/ pulm edema/ vol overload/ diffuse LE edema. HD on 7/3, 7/5, 7/7, 7/9.Gotbelow outpatient dry weightover the weekend but they have been trending back up. Large gains, reinforce fluid restriction and continue to challenge EDW as tolerated. Normal WOB on RA currently.  2. Exacerbation of COPD - meds per primary -improved. 3. ESRD - on HD MWF.HD Friday with total 4L removed.  No urgent indications for HD at this time.  last K 3.4, use 3K bath.Reinforce compliance with OP dialysis.   Can return to OP dialysis tomorrow. Will write orders for 2nd shift tomorrow if remains inpatient.   4. HD access - recent AVF surgery (not done here). Using [facility] now 5. Chronic abd pain - Worsened today. Primary ordered CT. Affecting some of his dialysis, gets worse while on HD.May be narcotic dependent, has oxy IR at home.  6. H/o chronic pancreatitis/abdominal pain-per primary 7. HTN - longstanding  -Elevated today.  Continue current medsand volume removal as tolerated. 8. OSA - on CPAP - but refusing to where here. 9. Anemia of CKD- Hb 8.0>8.7.Aranesp 200 with HD 7/7. 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 Have not resumed during hospitalization. Discontinued. ResumedFosrenol.Not on VDRA. 11. Nutrition - alb low - add prostat to renal diet/vits  12. Dispo - ok to d/c from renal standpoint.  Can return to OP dialysis per regular schedule.   Jen Mow, PA-C [facility] Pager: [phone] 01/30/2020,12:51 PM  LOS: 8 days

## 2020-01-30 NOTE — Progress Notes (Signed)
[name]: [phone]  Patient name: Frank [name] Medical record number: [id number] Date of birth: 1966/07/20 Age: 53 y.o. Gender: male  Primary Care Provider: Patient, [name] Consultants: Nephrology Code Status: FULL  Pt Overview and Major Events to Date:  Admitted 7/3  Assessment and Plan: Frank [name] a 54 y.o.malepresenting with worsening shortness of breath and cough.PMH is significant for ESRD on dialysis, chronic systolic heart failure, hypertension, chronic abdominal pain 2/2 chronic pancreatitis, type 2 diabetes, and COPD.  Acute on chronic abdominal pain: Worsening. Severe abdominal pain reportedly different than his chronic, however in same location. Abdomen distended, slightly more than baseline.  May be related to known chronic pain, however will obtain imaging and labs to rule out acute pancreatitis, gallbladder pathology, or SBO given distention. -CMP, lipase, CBC -CT abdomen/pelvis Wo contrast -IM Dilaudid once, otherwise continue home oxycodone -Zofran as needed -Pending work-up above and improvement, hopeful to discharge home today  SOB, likely 2/2 fluid overload+COPD exacerbationand CHF exacerbation:Resolved. Completed COPD treatment, approximately at dry weight. -DuoNebs as needed -S/p azithromycin plus prednisone -Tessalon Perles -Continue HD on normal MWF schedule -Will need outpatient PFTs and follow-up with cardiology as discussed below  Large pericardial effusion: Stable. No associated tamponade criteria.  This has been present on previous echocardiograms, however larger currently.  Cardiology does not believe any inpatient further work-up is needed at this time, will follow up with him outpatient.  HFmEF: Chronic, improving. EF 45%, improved from previous.  Volume control via HD. -Follow-up with cardiology outpatient  ESRD on HD: Stable. MWFschedule. -Nephrology on board, appreciate  assistance  - Continue dialysis MWF  Icythiosis of the extremities: Chronic, stable. - Lubriderm  Hypertension: Chronic, stable. SBP 150-160s.  Takes Coreg 25 mgBID,Norvasc 10 mg, and hydralazine13m TID.  -HD as above -Continue home medications  T2DM: Chronic, stable. A1c 5.4.Diet controlled.  Anemia CKD: Chronic, stable. At baseline -Monitor CBC  Insomnia: Chronic, stable. Follow-up outpatient for CPAP and continue home Restoril  FEN/GI: Renal diet with fluid restriction PPx: Lovenox  Disposition: Discharge home today pending abdominal work-up as above  Subjective:  No acute events overnight.  Reports having significant abdominal pain this morning that is unlike his baseline chronic pains.  He is unable to describe how it feels, however pain is in a bandlike formation across his upper abdomen.  Denies any nausea, vomiting, change in bowel movements.  Feels like his belly is more distended than usual, at baseline is slightly distended.  Objective: Temp:  [97.7 F (36.5 C)-98 F (36.7 C)] 97.7 F (36.5 C) (07/11 0500) Pulse Rate:  [70-79] 70 (07/11 0500) Resp:  [16-20] 16 (07/11 0500) BP: (155-163)/(46-82) 163/82 (07/11 0500) SpO2:  [98 %-100 %] 100 % (07/11 0500) Physical Exam: General: Alert, appears slightly uncomfortable HEENT: NCAT Lungs: Clear bilaterally, no increased WOB, no wheezing or rales appreciated, on 2L decreased down to 0 in room and maintained well Abdomen: soft, slightly distended, hypoactive bowel sounds, tender to palpation all over abdomen with voluntary guarding, no rebound Msk: Moves all extremities spontaneously, skin throughout exam is excessively dry and flaking Ext: Warm, dry, 2+ distal pulses  Laboratory: Recent Labs  Lab 01/25/20 0833 01/27/20 0315 01/28/20 0855  WBC 5.5 6.5 6.9  HGB 8.9* 8.0* 8.7*  HCT 29.0* 25.9* 29.0*  PLT 138* 160 195   Recent Labs  Lab 01/24/20 1344 01/27/20 0819 01/28/20 0855  NA 137 137 140   K 4.4 3.0* 3.4*  CL  100 102 102  CO2 _0 BUN 39* 20 54*  CREATININE 9.70* 3.53* 6.77*  CALCIUM 7.4* 7.7* 8.2*  GLUCOSE 205* 120* 152*    Imaging/Diagnostic Tests: 2D Echo 7/9 IMPRESSIONS  1. Left ventricular ejection fraction, by estimation, is 45% with  inferoseptal and basal inferior hypokinesis. COmpared to echo from 2019,  LVEF is improved. There is severe left ventricular hypertrophy. Left  ventricular diastolic parameters are  indeterminate.  2. Right ventricular systolic function is moderately reduced. The right  ventricular size is normal. There is moderately elevated pulmonary artery  systolic pressure.  3. Large pericardial effusion surrounds heart. Largest posteriorly (29  mm) and laterally No RV/RA collapse, no significant change in mitral  inflow with respiration. IVC is dilated with minimal respiratory  variation. Overall does not meet full criteria  for tamponade by echo criteria. Clinical correlation indicated. Compared  to echo from 2019, pericardial effusion is larger . Large pericardial  effusion.  4. The mitral valve is normal in structure. No evidence of mitral valve  regurgitation.  5. The aortic valve is grossly normal. Aortic valve regurgitation is not  visualized.  6. The inferior vena cava is dilated in size with <50% respiratory  variability, suggesting right atrial pressure of 15 mmHg.   Patriciaann Clan, DO 01/30/2020, 8:13 AM PGY-3, [place] Intern pager: [phone], text pages welcome

## 2020-01-30 NOTE — Plan of Care (Signed)
Problem: Fluid Volume: Goal: Compliance with measures to maintain balanced fluid volume will improve Outcome: Progressing   Problem: Nutritional: Goal: Ability to make healthy dietary choices will improve Outcome: Progressing

## 2020-01-31 ENCOUNTER — Telehealth (HOSPITAL_COMMUNITY): Payer: Self-pay | Admitting: Cardiology

## 2020-01-31 MED ORDER — ALBUTEROL SULFATE (2.5 MG/3ML) 0.083% IN NEBU: 2.5000 mg | INHALATION_SOLUTION | RESPIRATORY_TRACT | 0 refills | Status: AC | PRN

## 2020-01-31 MED ORDER — INCRUSE ELLIPTA 62.5 MCG/INH IN AEPB: 1.0000 | INHALATION_SPRAY | Freq: Every day | RESPIRATORY_TRACT | 0 refills | Status: AC

## 2020-01-31 NOTE — Progress Notes (Signed)
The patient is doing well today. He denies any new medical concerns. He is worried he might not be able to get to his dialysis this afternoon for transportation concerns.   No acute findings on his physical exam this morning.  CT abdomen result discussed with him and strongly recommended PCP follow-up for his renal cyst. He verbalized understanding and agreed to discuss with his PCP.  F/U outpatient with Cards for pericardial effusion.  We will arrange transportation to his dialysis.
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
## 2020-01-31 NOTE — Discharge Instructions (Signed)
Dear Frank [name],   Thank you so much for allowing [place] to be part of your care!  You were admitted to [facility] for worsening shortness of breath felt to be from missing dialysis and a flare of your COPD.  Fortunately after antibiotics, steroids, breathing treatments, and several sessions of dialysis your breathing is back to regular.  We have sent you home with a nebulizer machine to do breathing treatments at home if needed, however we highly encourage you to follow-up and get pulmonary function tests to assess how bad your COPD is.   Additionally, we encourage you to follow-up with the behavioral health clinic below for your anxiety.  The [facility] behavioral health crisis center: [address] [place], [place]: [phone] Behavioral Health Crisis Line: [phone]   POST-HOSPITAL & CARE INSTRUCTIONS 1. Please follow-up with a primary care provider to get scheduled for pulmonary function test 2. You need to follow-up with cardiology for the fluid around your heart.  3. Follow-up your sleep study test to get a CPAP. 4. Please let PCP/Specialists know of any changes that were made.  5. Please see medications section of this packet for any medication changes.   RETURN PRECAUTIONS: If you have any worsening shortness of breath, chest pain, or worsening abdominal pain.  Take care and be well!  [facility]   [place], [place] [address] [phone]

## 2020-01-31 NOTE — Progress Notes (Signed)
Renal Navigator called in to patient's room at 10:40am and he did not answer. Navigator spoke to [employer] RN/A. Alroy Dust, who states patient is discharged to private vehicle. Patient to drive himself to OP HD as we previously discussed this morning.  No further needs noted. Renal NP notified to send orders. Clinic aware that patient will be there for treatment today.   Alphonzo Cruise, [place] Renal Navigator [phone]
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
## 2020-01-31 NOTE — Progress Notes (Signed)
DISCHARGE NOTE HOME AYOMIDE PURDY to be discharged Home per MD order. Discussed prescriptions and follow up appointments with the patient. Prescriptions given to patient; medication list explained in detail. Patient verbalized understanding.  Skin clean, dry and intact without evidence of skin break down, no evidence of skin tears noted. IV catheter discontinued intact. Site without signs and symptoms of complications. Dressing and pressure applied. Pt denies pain at the site currently. No complaints noted.  Patient free of lines, drains, and wounds.   An After Visit Summary (AVS) was printed and given to the patient. Patient escorted via wheelchair, and discharged home via private auto.  Aneta Mins BSN, RN3

## 2020-01-31 NOTE — Progress Notes (Signed)
Pt placed on cpap at 11 PM

## 2020-01-31 NOTE — Progress Notes (Signed)
[name]: [phone]  Patient name: Frank [name] Medical record number: [id number] Date of birth: [birth year] Age: 54 y.o. Gender: male  Primary Care Provider: Patient, [name] Consultants: Nephrology  Code Status: Full Code  Pt Overview and Major Events to Date:  Admitted 7/3  Assessment and Plan: Frank [name] a 54 y.o.malepresenting with worsening dyspnea and cough.PMH is significant for ESRD on dialysis, chronic systolic heart failure, hypertension, chronic abdominal pain 2/2 chronic pancreatitis, type 2 diabetes and COPD.   Acute on chronic abdominal pain Previously experienced severe abdominal pain reportedly different than his chronic, however in same location. Abdomen distended, slightly more than baseline.  Today, abdomen is still distended and endorses RUQ pain rating it a 7/10. May be related to known chronic pain, however will obtain imaging and labs to rule out acute pancreatitis, gallbladder pathology, or SBO given distention. -CMP, lipase, CBC -CT abdomen/pelvis Wo contrast demonstrated no acute findings  -IM Dilaudid once, otherwise continue home oxycodone -Zofran as needed -Pending work-up above and improvement, discharge home today  SOB, likely 2/2 fluid overload+COPD exacerbationand CHF exacerbation:Resolved. Completed COPD treatment -DuoNebs as needed -S/p azithromycin plus prednisone -Tessalon Perles -Continue HD on normal MWF schedule -Send home on nebulizer -Will need outpatient PFTs and follow-up with cardiology as discussed below  Large pericardial effusion: Stable. No associated tamponade criteria.  This has been present on previous echocardiograms, however larger currently.  Cardiology does not believe any inpatient further work-up is needed at this time. -Follow up outpatient   HFmEF: Chronic, improving. EF 45%, improved from previous.  Volume control via HD. -Follow-up with  cardiology outpatient  Anxiety Chronic and stable. Patient has history of anxiety. He was referred to see Behavioral health as a outpatient for which he is very eager to do. -Provide information at discharge for patient to follow up at [facility] as outpatient   ESRD on HD: Stable. Patient receives hemodialysis on mondays, wednesdays and fridays.  - Continue dialysis MWF  Icythiosis of the extremities: Chronic, stable. Severe icythiosis present on the dorsal aspect of both feet.  - Lubriderm  Hypertension: Chronic, stable. BP today is 141/91. SBP 150-160s.  Takes Coreg 25 mgBID,Norvasc 10 mg, and hydralazine151m TID. -HD as above -Continue home medications -Follow up with PCP for continued outpatient management   T2DM: Chronic, stable. A1c 5.4 01/23/2020.Diet controlled. -Continue to monitor blood glucose levels   Anemia CKD: Chronic, stable. At baseline -Monitor CBC  Insomnia: Chronic, stable. Denies any trouble sleeping last night. Slept with CPAP for 5-6 hours according to patient. -Follow-up outpatient for CPAP and continue home Restoril  FEN/GI: Renal diet with fluid restriction PPx: Lovenox  Disposition: Discharged home today  Subjective:  Patient seen and examined at bedside. He was sitting comfortably at the edge of the bed. He still complains of abdominal pain that he describes in the RUQ and rates as a 7/10. He is looking forward to being discharged today and [facility]. Patient states that he wore his CPAP for 5-6 hours last night but the nurse reports that he only wore it for 2 hours and has been previously noncompliant on medications. He only likes to take dilaudid. Nurse is concerned that due to patient not regularly wearing his CPAP, it may not be covered by insurance. He also wants his nebulizer before discharge.   Objective: Temp:  [98.3  F (36.8 C)-98.9 F (37.2 C)] 98.3 F (36.8 C) (07/12 0526) Pulse Rate:  [60-79] 60 (07/12 0526) Resp:  [18] 18 (07/12 0526) BP: (141-158)/(80-94) 141/91 (07/12 0526) SpO2:  [92 %-100 %] 100 % (07/12 0526) Physical Exam: General: Patient in no acute distress, sitting comfortably.  Cardiovascular: regular rate and rhythm, no murmurs or gallops appreciated  Respiratory: lungs clear to auscultation bilaterally  Abdomen: active bowel sounds, distended, tender on palpation of RUQ Extremities: mild edema lower extremities bilaterally, severe icythiosis on the dorsal aspect of both feet. Psych: Mood appropriate, very good outlook, history of anxiety  Laboratory: Recent Labs  Lab 01/25/20 0833 01/27/20 0315 01/28/20 0855  WBC 5.5 6.5 6.9  HGB 8.9* 8.0* 8.7*  HCT 29.0* 25.9* 29.0*  PLT 138* 160 195   Recent Labs  Lab 01/27/20 0819 01/28/20 0855 01/30/20 1745  NA 137 140 137  K 3.0* 3.4* 4.2  CL 102 102 101  CO2 28 26 21*  BUN 20 54* 78*  CREATININE 3.53* 6.77* 8.22*  CALCIUM 7.7* 8.2* 8.8*  PROT  --   --  8.2*  BILITOT  --   --  0.8  ALKPHOS  --   --  226*  ALT  --   --  25  AST  --   --  26  GLUCOSE 120* 152* 107*      Imaging/Diagnostic Tests: CT ABDOMEN PELVIS WO CONTRAST  Result Date: 01/30/2020 CLINICAL DATA:  Diffuse abdominal pain and distension, greatest in the left upper quadrant. EXAM: CT ABDOMEN AND PELVIS WITHOUT CONTRAST TECHNIQUE: Multidetector CT imaging of the abdomen and pelvis was performed following the standard protocol without IV contrast. COMPARISON:  Noncontrast CT on 12/30/2019 FINDINGS: Lower chest: Stable cardiomegaly moderate pericardial effusion. Stable right basilar pleural-parenchymal scarring, rounded atelectasis, and small loculated pleural effusion. Radiodensity in the right pleural space may be due to previous talc pleurodesis. Hepatobiliary: No mass visualized on this unenhanced exam. Gallbladder is unremarkable. No evidence of biliary  ductal dilatation. Pancreas: No mass or inflammatory process visualized on this unenhanced exam. Spleen:  Within normal limits in size. Adrenals/Urinary tract: Diffuse bilateral renal parenchymal atrophy again seen. A 2.4 cm intermediate attenuation exophytic lesion arising from the midpole of the left kidney shows no significant change compared to previous exams dating back to 2020. This may represent a hemorrhagic cyst although a solid renal mass cannot definitely be excluded. No evidence of hydronephrosis. Dystrophic partially calcified renal transplant in left iliac fossa remains stable since previous study. Urinary bladder is nearly empty. Stomach/Bowel: No evidence of obstruction, inflammatory process, or abnormal fluid collections. Vascular/Lymphatic: No pathologically enlarged lymph nodes identified. No evidence of abdominal aortic aneurysm. Aortic atherosclerosis noted. Reproductive:  No mass or other significant abnormality. Other:  None Musculoskeletal: No suspicious bone lesions identified. Diffuse osteosclerosis again seen, consistent with renal osteodystrophy. IMPRESSION: No acute findings. Stable 2.4 cm exophytic lesion arising from midpole of left kidney, which may represent a hemorrhagic cyst although a solid renal mass cannot definitely be excluded. Consider abdomen MRI for further characterization. Stable cardiomegaly and moderate pericardial effusion. Stable right basilar pleural-parenchymal scarring, rounded atelectasis, and small loculated right pleural effusion. Aortic Atherosclerosis (ICD10-I70.0). Electronically Signed   By: Marlaine Hind M.D.   On: 01/30/2020 13:51    Donney Dice, DO 01/31/2020, 7:17 AM PGY-1, [place] Intern pager: [phone], text pages welcome

## 2020-01-31 NOTE — Progress Notes (Signed)
Patient took himself off cpap and placed oxygen back on at 2 liters nasal canula.

## 2020-01-31 NOTE — Telephone Encounter (Signed)
I received order for echocardiogram to be scheduled and noticed that patient had one on 01/28/20.  Does patient still need?

## 2020-02-09 ENCOUNTER — Encounter (HOSPITAL_COMMUNITY): Payer: Self-pay

## 2020-02-09 ENCOUNTER — Emergency Department (HOSPITAL_COMMUNITY): Payer: Medicare Other

## 2020-02-09 ENCOUNTER — Other Ambulatory Visit: Payer: Self-pay

## 2020-02-09 ENCOUNTER — Inpatient Hospital Stay (HOSPITAL_COMMUNITY)
Admission: EM | Admit: 2020-02-09 | Discharge: 2020-02-12 | DRG: 291 | Disposition: A | Discharge status: Home / Self Care | Payer: Medicare Other | Attending: Family Medicine | Admitting: Family Medicine

## 2020-02-09 DIAGNOSIS — G4730 Sleep apnea, unspecified: Secondary | ICD-10-CM | POA: Diagnosis present

## 2020-02-09 DIAGNOSIS — K559 Vascular disorder of intestine, unspecified: Secondary | ICD-10-CM | POA: Diagnosis not present

## 2020-02-09 DIAGNOSIS — K219 Gastro-esophageal reflux disease without esophagitis: Secondary | ICD-10-CM | POA: Diagnosis present

## 2020-02-09 DIAGNOSIS — N186 End stage renal disease: Secondary | ICD-10-CM | POA: Diagnosis present

## 2020-02-09 DIAGNOSIS — I5043 Acute on chronic combined systolic (congestive) and diastolic (congestive) heart failure: Secondary | ICD-10-CM | POA: Diagnosis present

## 2020-02-09 DIAGNOSIS — D631 Anemia in chronic kidney disease: Secondary | ICD-10-CM | POA: Diagnosis present

## 2020-02-09 DIAGNOSIS — Z888 Allergy status to other drugs, medicaments and biological substances status: Secondary | ICD-10-CM

## 2020-02-09 DIAGNOSIS — Z79899 Other long term (current) drug therapy: Secondary | ICD-10-CM | POA: Diagnosis not present

## 2020-02-09 DIAGNOSIS — Z87891 Personal history of nicotine dependence: Secondary | ICD-10-CM

## 2020-02-09 DIAGNOSIS — I428 Other cardiomyopathies: Secondary | ICD-10-CM | POA: Diagnosis present

## 2020-02-09 DIAGNOSIS — I132 Hypertensive heart and chronic kidney disease with heart failure and with stage 5 chronic kidney disease, or end stage renal disease: Secondary | ICD-10-CM | POA: Diagnosis present

## 2020-02-09 DIAGNOSIS — I700 Atherosclerosis of aorta: Secondary | ICD-10-CM | POA: Diagnosis present

## 2020-02-09 DIAGNOSIS — Z886 Allergy status to analgesic agent status: Secondary | ICD-10-CM

## 2020-02-09 DIAGNOSIS — I509 Heart failure, unspecified: Secondary | ICD-10-CM

## 2020-02-09 DIAGNOSIS — Z86718 Personal history of other venous thrombosis and embolism: Secondary | ICD-10-CM | POA: Diagnosis not present

## 2020-02-09 DIAGNOSIS — I959 Hypotension, unspecified: Secondary | ICD-10-CM | POA: Diagnosis not present

## 2020-02-09 DIAGNOSIS — G8929 Other chronic pain: Secondary | ICD-10-CM | POA: Diagnosis present

## 2020-02-09 DIAGNOSIS — J9600 Acute respiratory failure, unspecified whether with hypoxia or hypercapnia: Secondary | ICD-10-CM | POA: Diagnosis present

## 2020-02-09 DIAGNOSIS — E1122 Type 2 diabetes mellitus with diabetic chronic kidney disease: Secondary | ICD-10-CM | POA: Diagnosis present

## 2020-02-09 DIAGNOSIS — M898X9 Other specified disorders of bone, unspecified site: Secondary | ICD-10-CM | POA: Diagnosis present

## 2020-02-09 DIAGNOSIS — I272 Pulmonary hypertension, unspecified: Secondary | ICD-10-CM | POA: Diagnosis present

## 2020-02-09 DIAGNOSIS — Z7951 Long term (current) use of inhaled steroids: Secondary | ICD-10-CM | POA: Diagnosis not present

## 2020-02-09 DIAGNOSIS — Z885 Allergy status to narcotic agent status: Secondary | ICD-10-CM

## 2020-02-09 DIAGNOSIS — Z7901 Long term (current) use of anticoagulants: Secondary | ICD-10-CM | POA: Diagnosis not present

## 2020-02-09 DIAGNOSIS — F419 Anxiety disorder, unspecified: Secondary | ICD-10-CM | POA: Diagnosis present

## 2020-02-09 DIAGNOSIS — K861 Other chronic pancreatitis: Secondary | ICD-10-CM | POA: Diagnosis present

## 2020-02-09 DIAGNOSIS — Z992 Dependence on renal dialysis: Secondary | ICD-10-CM

## 2020-02-09 DIAGNOSIS — I313 Pericardial effusion (noninflammatory): Secondary | ICD-10-CM | POA: Diagnosis present

## 2020-02-09 DIAGNOSIS — J810 Acute pulmonary edema: Secondary | ICD-10-CM

## 2020-02-09 DIAGNOSIS — J449 Chronic obstructive pulmonary disease, unspecified: Secondary | ICD-10-CM | POA: Diagnosis present

## 2020-02-09 DIAGNOSIS — Z20822 Contact with and (suspected) exposure to covid-19: Secondary | ICD-10-CM | POA: Diagnosis present

## 2020-02-09 DIAGNOSIS — Z9115 Patient's noncompliance with renal dialysis: Secondary | ICD-10-CM

## 2020-02-09 DIAGNOSIS — R9431 Abnormal electrocardiogram [ECG] [EKG]: Secondary | ICD-10-CM

## 2020-02-09 DIAGNOSIS — Z86711 Personal history of pulmonary embolism: Secondary | ICD-10-CM

## 2020-02-09 HISTORY — DX: Heart failure, unspecified: I50.9

## 2020-02-09 LAB — CBC
HCT: 28.6 % — ABNORMAL LOW (ref 39.0–52.0)
Hemoglobin: 9 g/dL — ABNORMAL LOW (ref 13.0–17.0)
MCH: 27.6 pg (ref 26.0–34.0)
MCHC: 31.5 g/dL (ref 30.0–36.0)
MCV: 87.7 fL (ref 80.0–100.0)
Platelets: 138 10*3/uL — ABNORMAL LOW (ref 150–400)
RBC: 3.26 MIL/uL — ABNORMAL LOW (ref 4.22–5.81)
RDW: 19.1 % — ABNORMAL HIGH (ref 11.5–15.5)
WBC: 5 10*3/uL (ref 4.0–10.5)
nRBC: 0 % (ref 0.0–0.2)

## 2020-02-09 LAB — BASIC METABOLIC PANEL
Anion gap: 16 — ABNORMAL HIGH (ref 5–15)
BUN: 91 mg/dL — ABNORMAL HIGH (ref 6–20)
CO2: 25 mmol/L (ref 22–32)
Calcium: 8.8 mg/dL — ABNORMAL LOW (ref 8.9–10.3)
Chloride: 99 mmol/L (ref 98–111)
Creatinine, Ser: 12.08 mg/dL — ABNORMAL HIGH (ref 0.61–1.24)
GFR calc Af Amer: 5 mL/min — ABNORMAL LOW (ref >60)
GFR calc non Af Amer: 4 mL/min — ABNORMAL LOW (ref >60)
Glucose, Bld: 80 mg/dL (ref 70–99)
Potassium: 4.9 mmol/L (ref 3.5–5.1)
Sodium: 140 mmol/L (ref 135–145)

## 2020-02-09 LAB — TROPONIN I (HIGH SENSITIVITY)
Troponin I (High Sensitivity): 26 ng/L — ABNORMAL HIGH (ref <18)
Troponin I (High Sensitivity): 29 ng/L — ABNORMAL HIGH (ref <18)

## 2020-02-09 LAB — SARS CORONAVIRUS 2 BY RT PCR (HOSPITAL ORDER, PERFORMED IN ~~LOC~~ HOSPITAL LAB): SARS Coronavirus 2: NEGATIVE

## 2020-02-09 MED ORDER — APIXABAN 5 MG PO TABS
5.0000 mg | ORAL_TABLET | Freq: Two times a day (BID) | ORAL | Status: DC
  Administered 2020-02-10 – 2020-02-12 (×5): 5 mg via ORAL
  Filled 2020-02-09 (×6): qty 1

## 2020-02-09 MED ORDER — PENTAFLUOROPROP-TETRAFLUOROETH EX AERO: 1.0000 "application " | INHALATION_SPRAY | CUTANEOUS | Status: DC | PRN

## 2020-02-09 MED ORDER — KETOROLAC TROMETHAMINE 30 MG/ML IJ SOLN
30.0000 mg | Freq: Once | INTRAMUSCULAR | Status: AC
  Administered 2020-02-09: 30 mg via INTRAVENOUS
  Filled 2020-02-09: qty 1

## 2020-02-09 MED ORDER — ACETAMINOPHEN 325 MG PO TABS: 650.0000 mg | ORAL_TABLET | ORAL | Status: DC | PRN

## 2020-02-09 MED ORDER — SODIUM CHLORIDE 0.9 % IV SOLN: 100.0000 mL | INTRAVENOUS | Status: DC | PRN

## 2020-02-09 MED ORDER — FERROUS SULFATE 325 (65 FE) MG PO TABS
325.0000 mg | ORAL_TABLET | Freq: Every day | ORAL | Status: DC
  Administered 2020-02-11: 325 mg via ORAL
  Filled 2020-02-09 (×3): qty 1

## 2020-02-09 MED ORDER — LIDOCAINE HCL (PF) 1 % IJ SOLN: 5.0000 mL | INTRAMUSCULAR | Status: DC | PRN

## 2020-02-09 MED ORDER — CHLORHEXIDINE GLUCONATE CLOTH 2 % EX PADS: 6.0000 | MEDICATED_PAD | Freq: Every day | CUTANEOUS | Status: DC

## 2020-02-09 MED ORDER — SODIUM CHLORIDE 0.9 % IV SOLN: 250.0000 mL | INTRAVENOUS | Status: DC | PRN

## 2020-02-09 MED ORDER — LIDOCAINE-PRILOCAINE 2.5-2.5 % EX CREA: 1.0000 "application " | TOPICAL_CREAM | CUTANEOUS | Status: DC | PRN

## 2020-02-09 MED ORDER — HEPARIN SODIUM (PORCINE) 1000 UNIT/ML DIALYSIS: 1000.0000 [IU] | INTRAMUSCULAR | Status: DC | PRN

## 2020-02-09 MED ORDER — IPRATROPIUM-ALBUTEROL 0.5-2.5 (3) MG/3ML IN SOLN: 3.0000 mL | Freq: Four times a day (QID) | RESPIRATORY_TRACT | Status: DC | PRN

## 2020-02-09 MED ORDER — SODIUM CHLORIDE 0.9% FLUSH: 3.0000 mL | INTRAVENOUS | Status: DC | PRN

## 2020-02-09 MED ORDER — OXYCODONE HCL 5 MG PO TABS: 15.0000 mg | ORAL_TABLET | ORAL | Status: DC | PRN

## 2020-02-09 MED ORDER — SENNOSIDES-DOCUSATE SODIUM 8.6-50 MG PO TABS
2.0000 | ORAL_TABLET | Freq: Every day | ORAL | Status: DC
  Filled 2020-02-09 (×2): qty 2

## 2020-02-09 MED ORDER — HYDROMORPHONE HCL 1 MG/ML IJ SOLN
1.0000 mg | Freq: Once | INTRAMUSCULAR | Status: AC
  Administered 2020-02-09: 1 mg via INTRAVENOUS
  Filled 2020-02-09: qty 1

## 2020-02-09 MED ORDER — OXYCODONE HCL 5 MG PO TABS
15.0000 mg | ORAL_TABLET | Freq: Four times a day (QID) | ORAL | Status: DC | PRN
  Administered 2020-02-11 – 2020-02-12 (×4): 15 mg via ORAL
  Filled 2020-02-09 (×6): qty 3

## 2020-02-09 MED ORDER — UMECLIDINIUM BROMIDE 62.5 MCG/INH IN AEPB
1.0000 | INHALATION_SPRAY | Freq: Every day | RESPIRATORY_TRACT | Status: DC
  Administered 2020-02-09 – 2020-02-11 (×3): 1 via RESPIRATORY_TRACT
  Filled 2020-02-09: qty 7

## 2020-02-09 MED ORDER — TEMAZEPAM 15 MG PO CAPS
15.0000 mg | ORAL_CAPSULE | Freq: Every day | ORAL | Status: DC
  Administered 2020-02-10: 15 mg via ORAL
  Filled 2020-02-09 (×3): qty 1

## 2020-02-09 MED ORDER — SODIUM CHLORIDE 0.9% FLUSH
3.0000 mL | Freq: Two times a day (BID) | INTRAVENOUS | Status: DC
  Administered 2020-02-10 – 2020-02-12 (×4): 3 mL via INTRAVENOUS

## 2020-02-09 MED ORDER — ONDANSETRON HCL 4 MG/2ML IJ SOLN
4.0000 mg | Freq: Once | INTRAMUSCULAR | Status: AC
  Administered 2020-02-09: 4 mg via INTRAVENOUS
  Filled 2020-02-09: qty 2

## 2020-02-09 MED ORDER — HYDROMORPHONE HCL 1 MG/ML IJ SOLN
0.5000 mg | INTRAMUSCULAR | Status: DC | PRN
  Administered 2020-02-09 (×2): 0.5 mg via INTRAVENOUS
  Filled 2020-02-09 (×2): qty 1

## 2020-02-09 MED ORDER — LIDOCAINE 5 % EX PTCH
2.0000 | MEDICATED_PATCH | CUTANEOUS | Status: DC
  Administered 2020-02-10: 2 via TRANSDERMAL
  Filled 2020-02-09 (×4): qty 2

## 2020-02-09 MED ORDER — HEPARIN SODIUM (PORCINE) 5000 UNIT/ML IJ SOLN: 5000.0000 [IU] | Freq: Three times a day (TID) | INTRAMUSCULAR | Status: DC

## 2020-02-09 MED ORDER — ALTEPLASE 2 MG IJ SOLR: 2.0000 mg | Freq: Once | INTRAMUSCULAR | Status: DC | PRN

## 2020-02-09 NOTE — ED Notes (Signed)
Attempted to reassess vital signs again but pt wants pain medication first. Nurse was informed.

## 2020-02-09 NOTE — ED Triage Notes (Addendum)
Pt sts waking up at home with left sided chest pain around 2 am. Dialysis pt on T, TH, Sat.

## 2020-02-09 NOTE — Plan of Care (Signed)
  Problem: Education: Goal: Knowledge of General Education information will improve Description: Including pain rating scale, medication(s)/side effects and non-pharmacologic comfort measures Outcome: Completed/Met
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
## 2020-02-09 NOTE — ED Notes (Signed)
Patient continually requesting a diet order, notified multiple times that he does not have one in the computer, therefore he cannot get breakfast.

## 2020-02-09 NOTE — Plan of Care (Signed)
  Problem: Education: Goal: Knowledge of disease and its progression will improve Outcome: Progressing   Problem: Fluid Volume: Goal: Compliance with measures to maintain balanced fluid volume will improve Outcome: Progressing   Problem: Health Behavior/Discharge Planning: Goal: Ability to manage health-related needs will improve Outcome: Progressing   Problem: Nutritional: Goal: Ability to make healthy dietary choices will improve Outcome: Progressing   Problem: Clinical Measurements: Goal: Complications related to the disease process, condition or treatment will be avoided or minimized Outcome: Progressing

## 2020-02-09 NOTE — Progress Notes (Signed)
Received page from pharmacy team inquiring about Eliquis.  Eliquis had not been listed as a home medication, however via conciliation from outside sources he has been on this medication.  They attempted to discuss this with him, however he did not want to talk at that time.  She called 2 different pharmacies who both have prescriptions for Eliquis however he had not filled them.  Through chart review, can see he had a history of small subsegmental PE without DVT in 2015 felt to be related to his HD catheter.  He then subsequently had a small vein DVT within his right basilic vein in 2800.  RUE axillary vein DVT in 02/2017, also felt to be HD related.  He has been on anticoagulation for the above.  Discussed Eliquis with patient, states he has been taking this consistently every day at home. Reports he had a "backlog of about 2 months" of Eliquis at home that he has been using instead of filling updated prescriptions (endorsed this prior to discussion about nonfill prescriptions).   Will order home Eliquis for tonight.  Additionally communicated with pharmacy tech, Kennith Maes, who will attempt to investigate this further with the patient later tonight.  Patriciaann Clan, DO
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
## 2020-02-09 NOTE — Progress Notes (Signed)
New Admission Note:   Arrival Method:  Stretcher  Mental Orientation: A&O x 4 Telemetry:  On Tele box # 16   Assessment: patient refused to have a complete assessment. Refused skin check with Kami RN at bedside Skin: dry, ecchymotic   IV: NSL right upper arm  Pain: none right now Tubes: O2 3L per nasal cannula Safety Measures: Safety Fall Prevention Plan has been given, discussed and signed Admission: Completed [facility] [place] Orientation: Patient has been orientated to the room, unit and staff.  Family: unavailable  Orders have been reviewed and implemented. Will continue to monitor the patient. Call light has been placed within reach and bed alarm has been activated.   Dolores Hoose, RN

## 2020-02-09 NOTE — ED Notes (Signed)
Patient still refusing EKG, states he needs another 5 minutes.

## 2020-02-09 NOTE — ED Notes (Signed)
Pt is refusing to get vital signs rechecked. Keeps on taking off blood pressure cuff and pulse ox.
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
## 2020-02-09 NOTE — ED Notes (Signed)
Attempted to call report to Idamae Schuller, RN at [facility]. [facility] called for transport.

## 2020-02-09 NOTE — ED Notes (Addendum)
Patient refusing chest xray, states he does not want to "bottom out" in Ct because "my blood sugar is too low".

## 2020-02-09 NOTE — ED Notes (Signed)
Pt not allowing an EKG to be performed at this time. He stated, "I need oxygen first; I need to get my breathing better. At home my oxygen was 89-90%." Oxygen saturation currently 100%.

## 2020-02-09 NOTE — ED Provider Notes (Signed)
Assumed care from [facility], PA-C at shift change pending admission for dialysis due to pulmonary edema. See her note for full HPI.  Patient is a 54 year old male with a past medical history significant for hypertension, acute on chronic pancreatitis, pulmonary edema, ESRD on dialysis Tuesday/Thursday/Saturday with last dialysis being on Saturday, combined systolic and diastolic congestive heart failure and diabetes who presents to the ED due to left-sided, nonradiating, sharp chest pain associated with shortness of breath.  Patient notes chest pain is similar to his past episodes of chest pain.  Notes he missed dialysis yesterday because he was not feeling well.  History obtained from patient and past medical records. No interpreter used during encounter.   Physical Exam  BP (!) 125/96 (BP Location: Left Leg)   Pulse 84   Temp 97.8 F (36.6 C)   Resp 15   Ht _0  (1.88 m)   Wt 81.6 kg   SpO2 100%   BMI 23.11 kg/m   Physical Exam Vitals and nursing note reviewed.  Constitutional:      General: He is not in acute distress.    Appearance: He is not ill-appearing.  HENT:     Head: Normocephalic.  Eyes:     Pupils: Pupils are equal, round, and reactive to light.  Cardiovascular:     Rate and Rhythm: Normal rate and regular rhythm.     Pulses: Normal pulses.     Heart sounds: Normal heart sounds. No murmur heard.  No friction rub. No gallop.   Pulmonary:     Effort: Pulmonary effort is normal.     Breath sounds: Normal breath sounds.     Comments: Respirations equal and unlabored, patient able to speak in full sentences, lungs clear to auscultation bilaterally Abdominal:     General: Abdomen is flat. Bowel sounds are normal. There is no distension.     Palpations: Abdomen is soft.     Tenderness: There is no abdominal tenderness. There is no guarding or rebound.  Musculoskeletal:     Cervical back: Neck supple.     Comments: Able to move all 4 extremities without  difficulty. 2+ pitting edema bilaterally.   Skin:    General: Skin is warm and dry.  Neurological:     General: No focal deficit present.     Mental Status: He is alert.  Psychiatric:        Mood and Affect: Mood normal.        Behavior: Behavior normal.     ED Course/Procedures   Clinical Course as of Feb 08 637  Wed Feb 09, 2020  0635 Suspect incorrect.  I checked patient's blood pressure on initial exam and it is normal  BP(!): 84/58 [KA]    Clinical Course User Index [KA] Albrizze, Harley Hallmark, PA-C    Procedures  MDM  Assumed care from [facility], PA-C at shift change pending admission for dialysis due to pulmonary edema. See her note for full MDM.  54 year old male presents to the ED due to chest pain and shortness of breath.  Upon arrival, patient afebrile, not tachycardic with an O2 saturation at 92%.  Blood pressure documented to be 84/58 upon arrival; however, previous provider rechecked blood pressure upon initial evaluation which was 125/96 with first reading suspected to be incorrect.  Blood pressure must be taken on patient's thigh rather than arm to get a correct reading.  Patient in no acute distress and nontoxic-appearing.  I have reviewed all work-up from previous provider.  CBC significant for anemia with hemoglobin at 9 which appears better than baseline.  Thrombocytopenia with platelets at 138.  Initial troponin XX 6 with second troponin being 29 which appears lower than baseline.  BMP significant for elevated creatinine at 12.08 and BUN at 91.  Normal potassium at 4.9.  Chest x-ray personally reviewed which demonstrates: IMPRESSION:  Unchanged cardiomegaly, small right pleural effusion, and bilateral  interstitial opacities likely reflecting edema.   EKG personally reviewed which demonstrates normal sinus rhythm with no signs of acute ischemia.  Prolonged QTC. Low suspicion for ACS. Suspect chest pain and shortness of breath related to pulmonary edema  secondary to missed dialysis.   7:47 AM Discussed case with Dr. Royce Macadamia with nephrology who notes patient needs to be transferred to [facility] for dialysis later today. Will consult hospitalist for admission.   8:48 AM Discussed case with Dr. Neysa Bonito with [facility] who agrees to accept patient for admission. Patient will be transferred to [facility] for dialysis and admission. COVID test pending.        Karie Kirks 02/09/20 [place], April, MD 02/09/20 2328

## 2020-02-10 ENCOUNTER — Inpatient Hospital Stay (HOSPITAL_COMMUNITY): Payer: Medicare Other

## 2020-02-10 DIAGNOSIS — I5043 Acute on chronic combined systolic (congestive) and diastolic (congestive) heart failure: Secondary | ICD-10-CM

## 2020-02-10 LAB — GLUCOSE, CAPILLARY
Glucose-Capillary: 51 mg/dL — ABNORMAL LOW (ref 70–99)
Glucose-Capillary: 68 mg/dL — ABNORMAL LOW (ref 70–99)
Glucose-Capillary: 71 mg/dL (ref 70–99)
Glucose-Capillary: 64 mg/dL — ABNORMAL LOW (ref 70–99)
Glucose-Capillary: 64 mg/dL — ABNORMAL LOW (ref 70–99)
Glucose-Capillary: 63 mg/dL — ABNORMAL LOW (ref 70–99)
Glucose-Capillary: 91 mg/dL (ref 70–99)
Glucose-Capillary: 93 mg/dL (ref 70–99)

## 2020-02-10 LAB — ECHOCARDIOGRAM LIMITED
Area-P 1/2: 4.31 cm2
Height: 74 in
S' Lateral: 3.4 cm
Weight: 3068.8 oz

## 2020-02-10 LAB — CBC
HCT: 29.6 % — ABNORMAL LOW (ref 39.0–52.0)
Hemoglobin: 8.9 g/dL — ABNORMAL LOW (ref 13.0–17.0)
MCH: 26.4 pg (ref 26.0–34.0)
MCHC: 30.1 g/dL (ref 30.0–36.0)
MCV: 87.8 fL (ref 80.0–100.0)
Platelets: 135 10*3/uL — ABNORMAL LOW (ref 150–400)
RBC: 3.37 MIL/uL — ABNORMAL LOW (ref 4.22–5.81)
RDW: 19.1 % — ABNORMAL HIGH (ref 11.5–15.5)
WBC: 4.6 10*3/uL (ref 4.0–10.5)
nRBC: 0 % (ref 0.0–0.2)

## 2020-02-10 LAB — BASIC METABOLIC PANEL
Anion gap: 16 — ABNORMAL HIGH (ref 5–15)
BUN: 101 mg/dL — ABNORMAL HIGH (ref 6–20)
CO2: 23 mmol/L (ref 22–32)
Calcium: 7.9 mg/dL — ABNORMAL LOW (ref 8.9–10.3)
Chloride: 100 mmol/L (ref 98–111)
Creatinine, Ser: 14.38 mg/dL — ABNORMAL HIGH (ref 0.61–1.24)
GFR calc Af Amer: 4 mL/min — ABNORMAL LOW (ref >60)
GFR calc non Af Amer: 3 mL/min — ABNORMAL LOW (ref >60)
Glucose, Bld: 72 mg/dL (ref 70–99)
Potassium: 5.5 mmol/L — ABNORMAL HIGH (ref 3.5–5.1)
Sodium: 139 mmol/L (ref 135–145)

## 2020-02-10 IMAGING — CR DG CHEST 2V
2 series · 2 of 2 positions shown · non-contrast
Comparison: 08/24/2017; 07/21/2017; 06/13/2017; chest CT-06/24/2017

CLINICAL DATA: Weakness following dialysis treatment.

EXAM:
CHEST  2 VIEW

[chest lat]
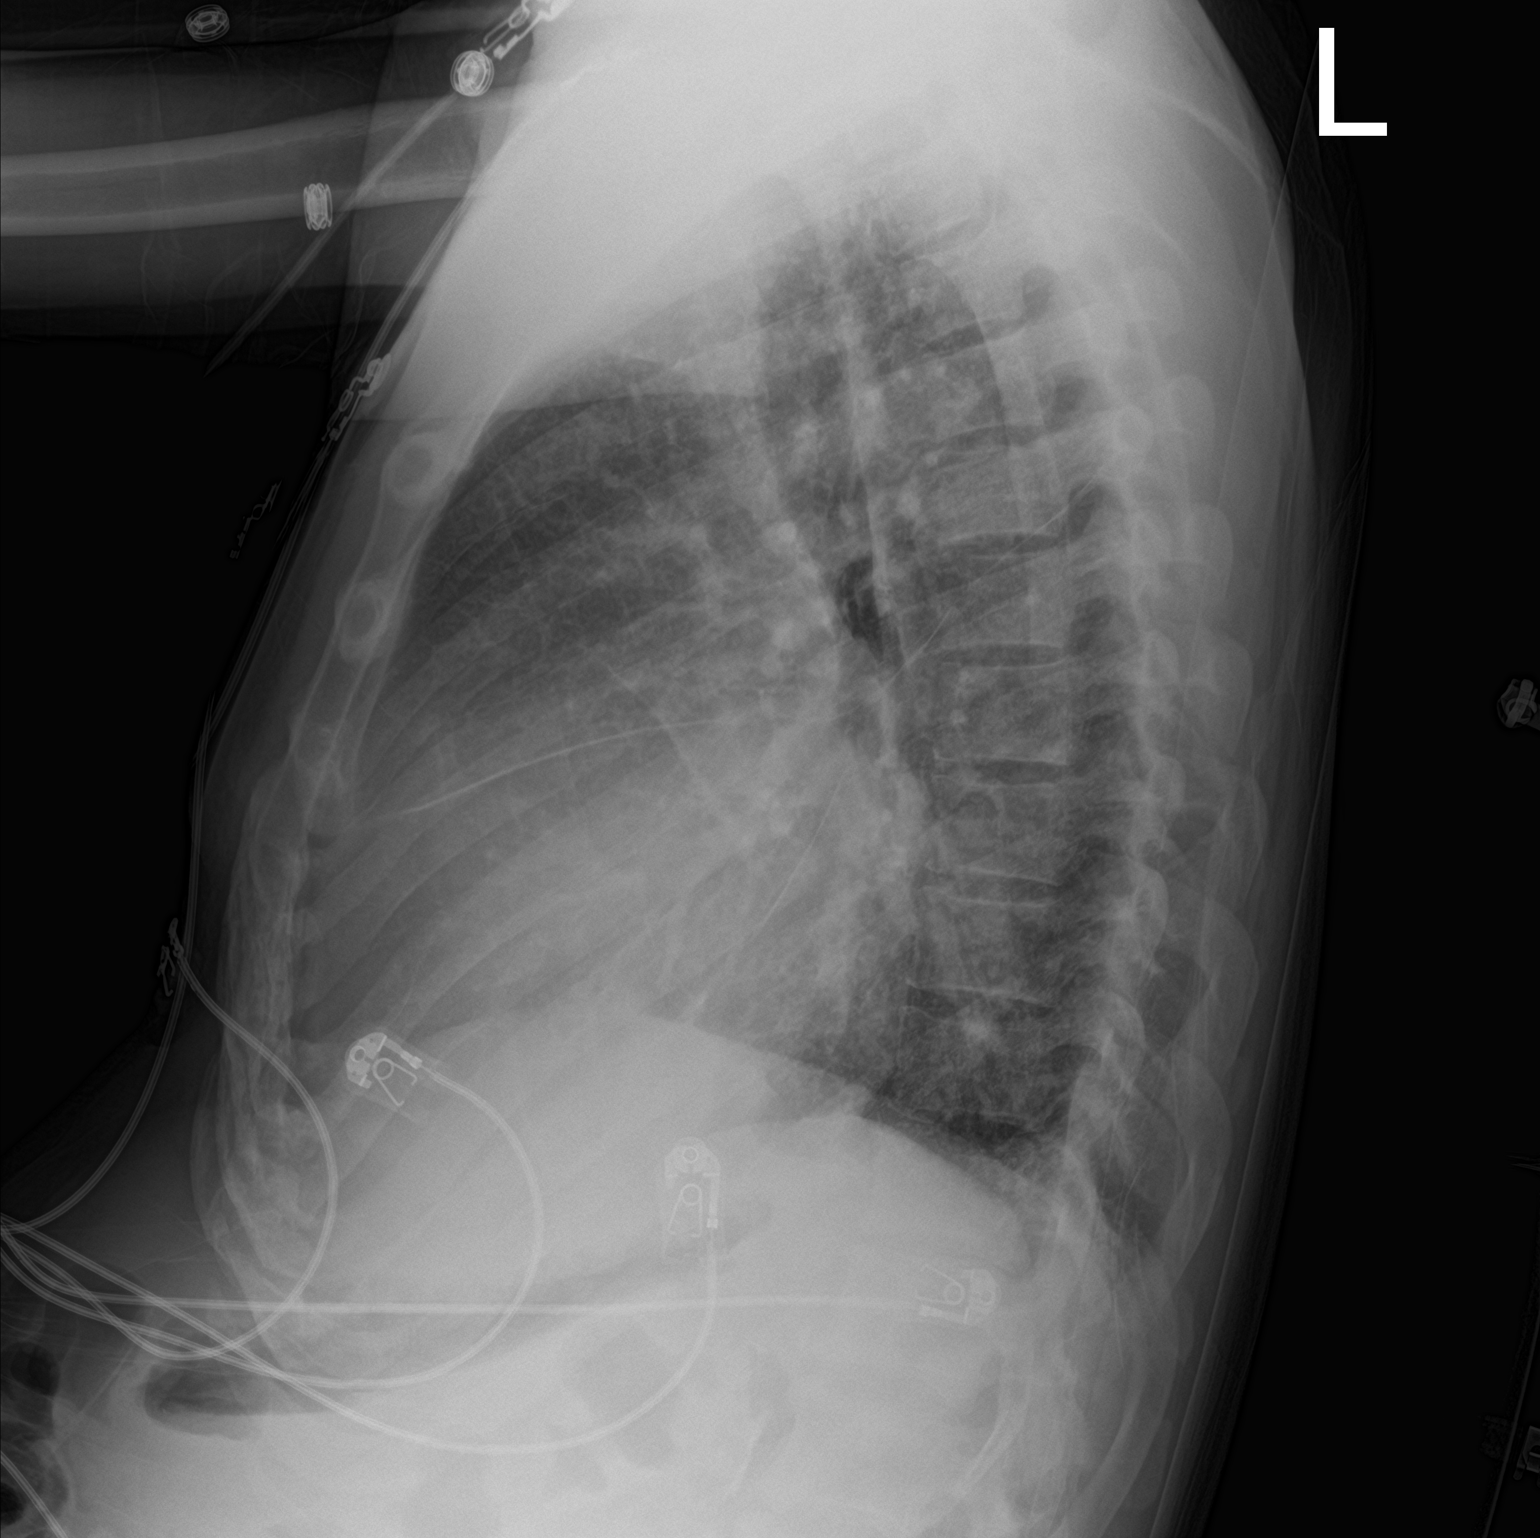

[chest ap]
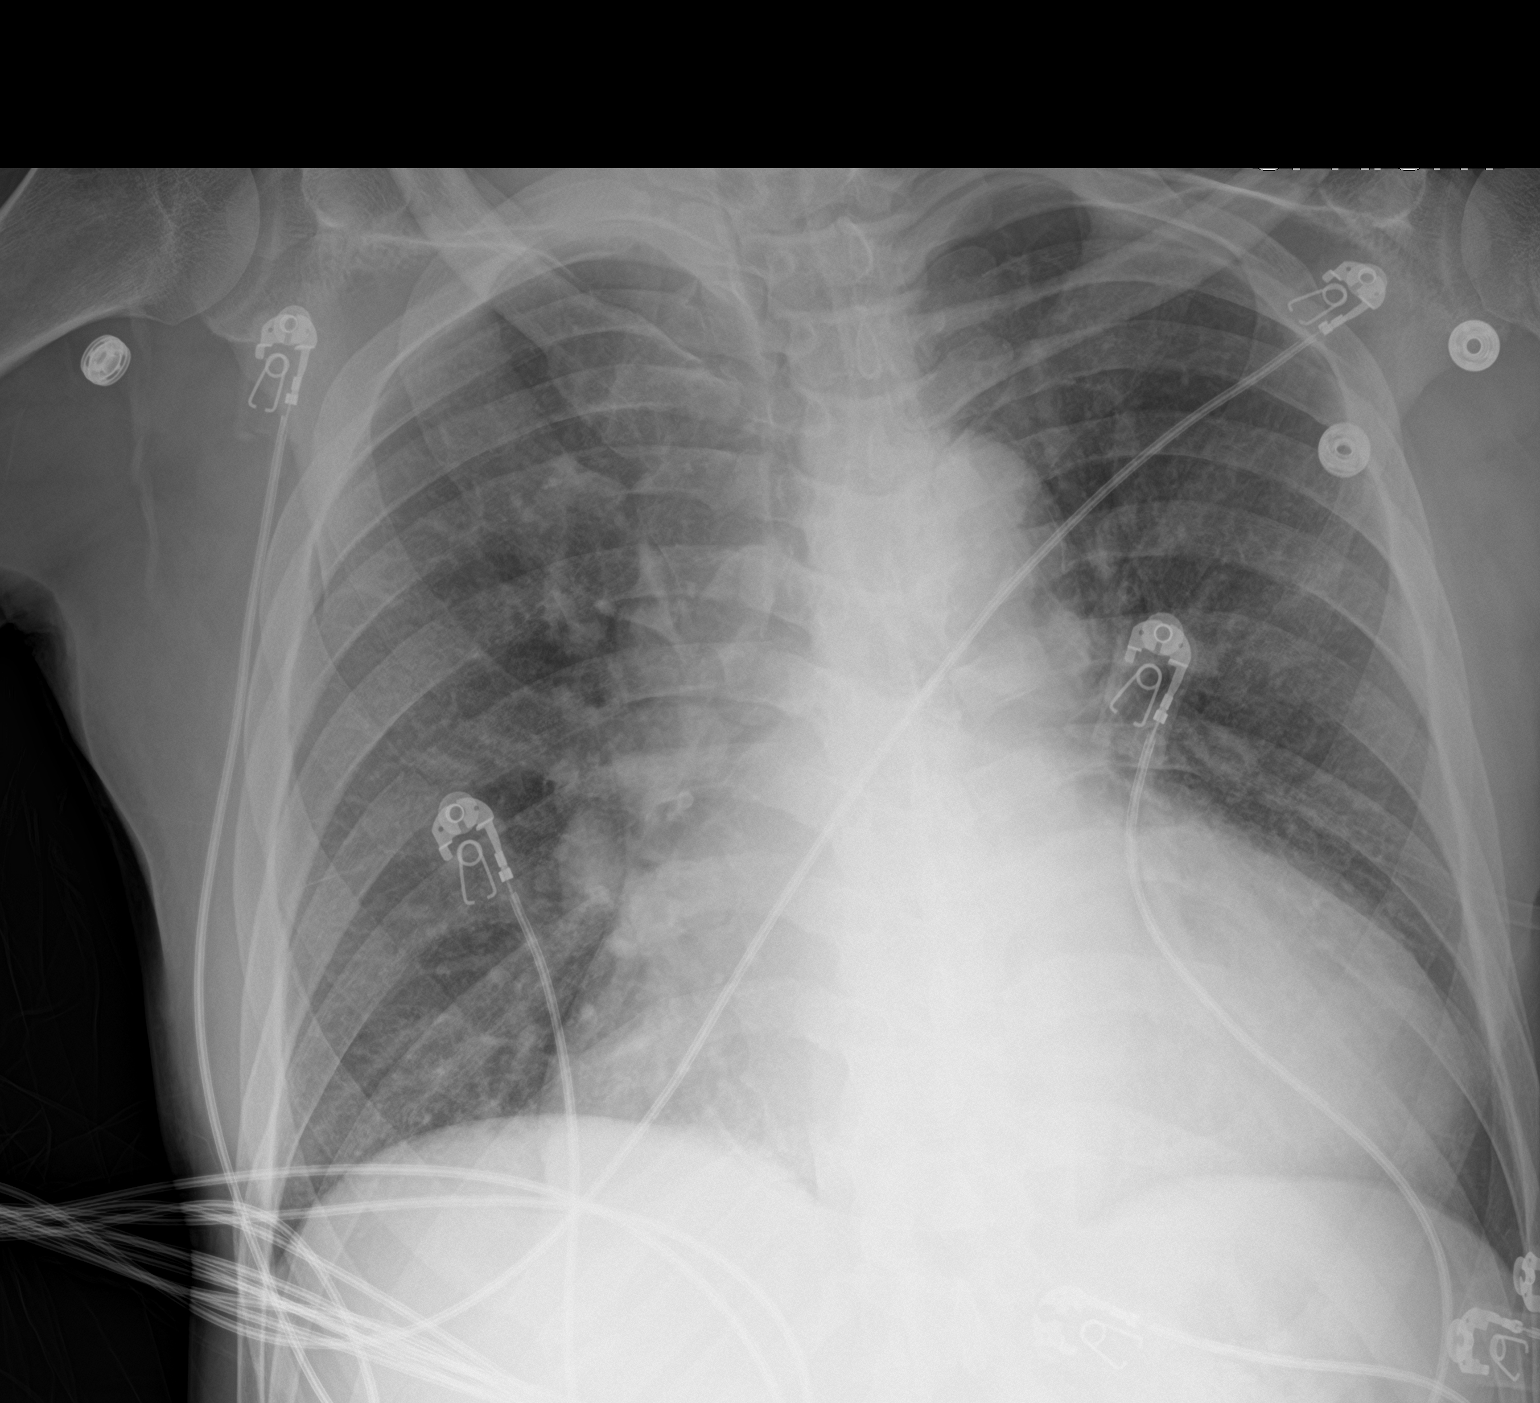

[2 of 2 positions shown; findings below may reference images not displayed]

FINDINGS: Grossly unchanged enlarged cardiac silhouette and mediastinal
contours. Chronic pulmonary venous congestion without frank evidence
of edema. No new focal airspace opacities. No pleural effusion,
though a small amount of fluid is seen tracking within the bilateral
major and the right minor fissures. No pneumothorax. No acute osseus
abnormalities.
IMPRESSION: Similar findings cardiomegaly and pulmonary venous congestion
without superimposed acute cardiopulmonary disease.

## 2020-02-10 MED ORDER — DEXTROSE 50 % IV SOLN
INTRAVENOUS | Status: AC
  Administered 2020-02-10: 25 mL
  Filled 2020-02-10: qty 50

## 2020-02-10 MED ORDER — DEXTROSE 50 % IV SOLN
12.5000 g | INTRAVENOUS | Status: AC
  Administered 2020-02-10: 12.5 g via INTRAVENOUS
  Filled 2020-02-10: qty 50

## 2020-02-10 MED ORDER — SCOPOLAMINE 1 MG/3DAYS TD PT72
1.0000 | MEDICATED_PATCH | TRANSDERMAL | Status: DC
  Administered 2020-02-10: 1.5 mg via TRANSDERMAL
  Filled 2020-02-10: qty 1

## 2020-02-10 MED ORDER — HYDROMORPHONE HCL 1 MG/ML IJ SOLN
INTRAMUSCULAR | Status: AC
  Filled 2020-02-10: qty 0.5

## 2020-02-10 MED ORDER — HYDROMORPHONE HCL 1 MG/ML IJ SOLN
0.5000 mg | Freq: Once | INTRAMUSCULAR | Status: AC
  Administered 2020-02-10: 0.5 mg via INTRAVENOUS

## 2020-02-10 MED ORDER — HYDROMORPHONE HCL 1 MG/ML IJ SOLN: 1.0000 mg | Freq: Once | INTRAMUSCULAR | Status: AC

## 2020-02-10 MED ORDER — HYDROMORPHONE HCL 1 MG/ML IJ SOLN
INTRAMUSCULAR | Status: AC
  Administered 2020-02-10: 1 mg via INTRAVENOUS
  Filled 2020-02-10: qty 1

## 2020-02-10 MED ORDER — CARVEDILOL 25 MG PO TABS
25.0000 mg | ORAL_TABLET | Freq: Two times a day (BID) | ORAL | Status: DC
  Administered 2020-02-10 – 2020-02-11 (×3): 25 mg via ORAL
  Filled 2020-02-10 (×5): qty 1

## 2020-02-10 MED ORDER — PANTOPRAZOLE SODIUM 40 MG PO TBEC
40.0000 mg | DELAYED_RELEASE_TABLET | Freq: Every day | ORAL | Status: DC
  Administered 2020-02-10 – 2020-02-11 (×2): 40 mg via ORAL
  Filled 2020-02-10 (×3): qty 1

## 2020-02-10 NOTE — Progress Notes (Signed)
FPTS Interim Progress Note  Spoke with patient about the possibility of going home. He is uncomfortable with this right now. He would like to receive HD tom and continues to feel nervous about fluid intake due to his episodes of emesis in the morning. Spoke with patient about keeping his BGL above 70 and the importance of ingesting food and fluids.  Freida Busman, MD 02/10/2020, 4:43 PM PGY-1, [place] Medicine Service pager [phone]

## 2020-02-10 NOTE — Progress Notes (Signed)
  Echocardiogram 2D Echocardiogram has been performed.  Frank [name] 02/10/2020, 1:27 PM

## 2020-02-10 NOTE — Progress Notes (Signed)
Patient here due to acute on chronic systolic CHF in the setting of missed dialysis. He had a previous echo in early July that showed large pericardial effusion without temponade. Repeat echo is current pending. Cardmaster was unaware the reason for consult, I spoke with family medicine resident who suspect consult was placed by mistake. Will cancel for now. I did not find documentation of cardiology consult on today's or yesterday's note.   Please re-consult [place] if needed. Also if pericardial effusion further enlarges or has tamponade physiology, please let us know.   Thank you  Almyra Deforest PA-C

## 2020-02-10 NOTE — Progress Notes (Signed)
Hypoglycemic Event  CBG: 63  Treatment: D50 25 mL (12.5 gm)  Symptoms: Hungry and Nervous/irritable  Follow-up CBG: Time:1350  CBG Result: 91  Possible Reasons for Event: Inadequate meal intake Patient left for Hemodialysis without having breakfast  Comments/MD notified: Family Medicine was notified and aware of patient blood sugar.    [place]

## 2020-02-10 NOTE — Progress Notes (Signed)
[name]: [phone]  Patient name: Frank [name] Medical record number: [id number] Date of birth: August 19, 1965 Age: 54 y.o. Gender: male  Primary Care Provider: Patient, [name] Consultants: Nephro Code Status: FULL  Pt Overview and Major Events to Date:  Admitted from [facility] 02/09/2020- needed HD for dialysis  Assessment and Plan:  Atypical chest pain likely 2/2 HF exacerbation  Patient continues to have non-radiating left- sided chest pain. The chest pain is concentrated along the lateral border of the chest and ribcage. He is tender to palpations suggesting that the pain is likely MSK in nature. The patient does not recall any activity that coincided with pain onset, but he began having pain 7/20. Patient had emesis this morning and nausea and requested anti-nausea medication however, EKG showed prolonged QTc. Repeat EKG has been ordered. Per patient he has been vomiting since 7/19 and was not able to keep liquids down.  Patient does have zofran, compazine,  and scopolamine patch in his home meds. Patient also takes omeprazole as a home med. - f/u repeat EKG - Low dose Dilaudid for pain - Continue telemetry - nephro consulted, appreciate recommendations - HD - Scopolamine patch - Start protonix  Suspected acute on chronic HFmEF Recent echo 01/28/2020: EF 45% with inferior septal and basal inferior hypokinesis and severe LVH, moderately reduced RV systolic function with elevated pulmonary artery pressure and large pericardial effusion increased from prior without tamponade physiology.  Mitral valve and aortic valve normal. PCP  ordered repeat echo after hear systolic and diastolic murmurs on exam w/o hx of valvular dx. CXR showed a small right pleural effusion and bilateral opacities likely reflecting edema. Patient uses CPAP at home and reports that it is helpful. There was unchanged cardiomegaly on CXR. WBC is 5.0 decreasing  concern for infection. Home meds includes carvedilol and albuterol. On exam patient sounded congested in the upper airway with some crackles in LLL. - f/u repeat echo - Monitor daily weights  - strict I/Os - HD - Duonebs PRN  ESRD on HD T,Th, S The patient missed his most recent HD appt due to no transportation and appears fluid overloaded. Most recent BUN/Cr 91/12.08. Patient received Dilaudid for pain with HD. - HD today - Patient received Dilaudid for HD  HTN BP 99/41-177/89. Home meds include amlodopine, hydralazine, carvedilol,  -Restarting home med: carvedilol  Chronic pancreatitis pain Patient has home med oxycodone. He often takes Dilaudid for severe pain and with HD. - Dilaudid PRN - oxycodone scheduled -  Continue Senna docusate   FEN/GI: Renal carb modified PPx: Eliquis  Disposition: Inpatient  Subjective:  Patient received Dilaudid PRN for chronic pain, otherwise no overnight events. This morning patient had an episode of emesis prior to HD. The patient reported that he has been vomiting since 7/19. He reported that he has not been able to keep liquids down. He was having diarrhea, but began having soft stools over the past 2 days. The patient reports that he continues to have left sided, non-radiating chest pain. The pain is on the lateral side of the chest. He reports that he is "scared" to get off his [place], because he has seen his O2 sat decrease to the 80s on RA. He is comfortable at 2L [place].   Objective: Temp:  [97.4 F (36.3 C)-98.7 F (37.1 C)] 98.7 F (37.1 C) (07/22 0456) Pulse Rate:  [71-83] 78 (07/22 0456) Resp:  [16-18] 18 (07/22 0456) BP: (99-177)/(41-89) 177/89 (  07/22 0456) SpO2:  [91 %-100 %] 94 % (07/22 0741) Physical Exam: General: Resting in HD comfortably on 2L [place]. Cardiovascular: Regular rate and rythym Respiratory: Congested sounding with slight crackles in the LLL, good effort  Abdomen: No pain to palpation, normoactive bowel  sounds Extremities: Warm,Tight and fluid overloaded due to this could not feel pulse, non pitting edema noted  Laboratory: Recent Labs  Lab 02/09/20 0453  WBC 5.0  HGB 9.0*  HCT 28.6*  PLT 138*   Recent Labs  Lab 02/09/20 0453  NA 140  K 4.9  CL 99  CO2 25  BUN 91*  CREATININE 12.08*  CALCIUM 8.8*  GLUCOSE 80    EKG 02/09/2020- NSR with prolonged QTc appears similar to previous EKGs  Imaging/Diagnostic Tests: 02/09/2020- CXR Unchanged cardiomegaly, small right pleural effusion, and bilateral interstitial opacities likely reflecting edema.   Freida Busman, MD 02/10/2020, 7:42 AM PGY-1, [place] Intern pager: [phone], text pages welcome

## 2020-02-10 NOTE — Plan of Care (Signed)
  Problem: Education: Goal: Knowledge of disease and its progression will improve 02/10/2020 0801 by Dolores Hoose, RN Outcome: Progressing 02/09/2020 1813 by Dolores Hoose, RN Outcome: Progressing   Problem: Fluid Volume: Goal: Compliance with measures to maintain balanced fluid volume will improve 02/10/2020 0801 by Dolores Hoose, RN Outcome: Progressing 02/09/2020 1813 by Dolores Hoose, RN Outcome: Progressing   Problem: Health Behavior/Discharge Planning: Goal: Ability to manage health-related needs will improve 02/10/2020 0801 by Dolores Hoose, RN Outcome: Progressing 02/09/2020 1813 by Dolores Hoose, RN Outcome: Progressing   Problem: Nutritional: Goal: Ability to make healthy dietary choices will improve Outcome: Progressing   Problem: Clinical Measurements: Goal: Complications related to the disease process, condition or treatment will be avoided or minimized Outcome: Progressing

## 2020-02-10 NOTE — Plan of Care (Signed)
  Problem: Education: Goal: Knowledge of disease and its progression will improve 02/10/2020 1154 by Dolores Hoose, RN Outcome: Progressing 02/10/2020 0801 by Dolores Hoose, RN Outcome: Progressing   Problem: Fluid Volume: Goal: Compliance with measures to maintain balanced fluid volume will improve 02/10/2020 1154 by Dolores Hoose, RN Outcome: Progressing 02/10/2020 0801 by Dolores Hoose, RN Outcome: Progressing   Problem: Health Behavior/Discharge Planning: Goal: Ability to manage health-related needs will improve Outcome: Progressing   Problem: Health Behavior/Discharge Planning: Goal: Ability to manage health-related needs will improve Outcome: Progressing

## 2020-02-10 NOTE — Progress Notes (Signed)
Pt refused EKG at this time. Wanted to wait until after his nausea went away.

## 2020-02-10 NOTE — Progress Notes (Signed)
PT Cancellation Note  Patient Details Name: Frank [name] MRN: [id number] DOB: 06/29/1966   Cancelled Treatment:    Reason Eval/Treat Not Completed: Patient at procedure or test/unavailable (HD). PT will continue to f/u with pt acutely as available.    [place] 02/10/2020, 10:44 AM

## 2020-02-10 NOTE — Progress Notes (Signed)
Hypoglycemic Event  CBG: Results for Frank [name], Frank [name] (MRN [id number]) as of 02/10/2020 04:29  Ref. Range 02/10/2020 01:56  Glucose-Capillary Latest Ref Range: 70 - 99 mg/dL 51 (L)    Treatment: 4 oz juice/soda  Symptoms: Sweaty  Follow-up CBG: Time: CBG Result:  Possible Reasons for Event: Inadequate meal intake  Comments/MD notified:Results for Frank [name], Frank [name] (MRN [id number]) as of 02/10/2020 04:29  Ref. Range 02/10/2020 02:40  Glucose-Capillary Latest Ref Range: 70 - 99 mg/dL 68 (L)      [name]

## 2020-02-10 NOTE — Progress Notes (Signed)
[facility] Progress Note   Subjective:   Now at [facility], seen on HD.  C/o abd pain and n/v from chronic pancreatitis - requesting pain meds.  Cont L sided chest pain as well.  Breathing comfortable  Objective Vitals:   02/09/20 2354 02/10/20 0151 02/10/20 0456 02/10/20 0741  BP: (!) 152/74 (!) 102/46 (!) 177/89   Pulse: 79 81 78 86  Resp: _0 Temp: (!) 97.4 F (36.3 C) 97.6 F (36.4 C) 98.7 F (37.1 C)   TempSrc: Oral Oral Oral   SpO2: 93% 91% 94% 94%  Weight:      Height:       Physical Exam General: chronically ill appearing  Heart: RRR Lungs: clear ant, normal WOB Abdomen: mildly TTP epigastrium Extremities: trace woody edema Dialysis Access:  [facility] c/d/i  Additional Objective Labs: Basic Metabolic Panel: Recent Labs  Lab 02/09/20 0453  NA 140  K 4.9  CL 99  CO2 25  GLUCOSE 80  BUN 91*  CREATININE 12.08*  CALCIUM 8.8*   Liver Function Tests: No results for input(s): AST, ALT, ALKPHOS, BILITOT, PROT, ALBUMIN in the last 168 hours. No results for input(s): LIPASE, AMYLASE in the last 168 hours. CBC: Recent Labs  Lab 02/09/20 0453 02/10/20 0815  WBC 5.0 4.6  HGB 9.0* 8.9*  HCT 28.6* 29.6*  MCV 87.7 87.8  PLT 138* 135*   Blood Culture    Component Value Date/Time   SDES PLEURAL 07/29/2018 1300   SDES PLEURAL 07/29/2018 1300   SPECREQUEST NONE 07/29/2018 1300   SPECREQUEST NONE 07/29/2018 1300   CULT  07/29/2018 1300    NO GROWTH 5 DAYS Performed at [facility], [place] [address] [place], [place] [address]    REPTSTATUS 08/03/2018 FINAL 07/29/2018 1300   REPTSTATUS 07/29/2018 FINAL 07/29/2018 1300    Cardiac Enzymes: No results for input(s): CKTOTAL, CKMB, CKMBINDEX, TROPONINI in the last 168 hours. CBG: Recent Labs  Lab 02/10/20 0156 02/10/20 0240 02/10/20 0541  GLUCAP 51* 68* 71   Iron Studies: No results for input(s): IRON, TIBC, TRANSFERRIN, FERRITIN in the last 72  hours. _1 @ Studies/Results: DG Chest Portable 1 View  Result Date: 02/09/2020 CLINICAL DATA:  Chest pain and shortness of breath.  On dialysis. EXAM: PORTABLE CHEST 1 VIEW COMPARISON:  01/22/2020 FINDINGS: A left jugular dialysis catheter remains in place terminating over the right atrium. The cardiac silhouette remains prominently enlarged. Bilateral interstitial opacities are similar to the prior study as is a small right pleural effusion. Asymmetric right basilar parenchymal opacity is also unchanged and likely reflects atelectasis. No pneumothorax is identified. No acute osseous abnormality is seen. IMPRESSION: Unchanged cardiomegaly, small right pleural effusion, and bilateral interstitial opacities likely reflecting edema. Electronically Signed   By: Logan Bores M.D.   On: 02/09/2020 06:46   Medications: . sodium chloride    . sodium chloride    . sodium chloride     . apixaban  5 mg Oral BID  . Chlorhexidine Gluconate Cloth  6 each Topical Q0600  . ferrous sulfate  325 mg Oral Daily  . lidocaine  2 patch Transdermal Q24H  . scopolamine  1 patch Transdermal Q72H  . senna-docusate  2 tablet Oral QHS  . sodium chloride flush  3 mL Intravenous Q12H  . temazepam  15 mg Oral QHS  . umeclidinium bromide  1 puff Inhalation Daily    Dialysis Orders: [facility] M[facility] 4 hours  EDW 79 kg  400  BF, 800 DF Last tx of 7/17 with post weight of 84.5 kg  2K /2 Ca Meds: mircera 200 mcg every 2 weeks last given on 7/14; venofer 50 mg weekly   Assessment/Plan: # Chest pain and dyspnea - Optimize volume with dialysis - Other work-up per primary team  # End-stage renal disease - HD off schedule today then per MWF schedule  # Acute on chronic combined systolic and diastolic CHF  - optimize volume with HD as above  # HTN  - optimize volume status with HD and team is holding anti-hypertensives for now, noting hypotension  # pericardial effusion - noted repeat TTE ordered per  primary team   # Anemia of CKD - last had mircera on 7/14 (200 mcg) - not yet due for redose   # Metabolic bone disease - Last PTH 2038 but not on activated vit D outpatient.  See hx of calciphylaxis documented. Continue binders     #Abd pain  - 1 time dilaudid 0.55m IV ordered and d/w RN, pt says unable to tolerate chronic po opioid due to nausea currently.   LJannifer HickMD 02/10/2020, 8:27 AM  [facility] Pager: [phone]

## 2020-02-10 NOTE — Progress Notes (Signed)
Hypoglycemic Event  CBG: Results for Frank [name], Frank [name] (MRN [id number]) as of 02/10/2020 04:29  Ref. Range 02/10/2020 02:40  Glucose-Capillary Latest Ref Range: 70 - 99 mg/dL 68 (L)    Treatment: D50 25 mL (12.5 gm)  Symptoms: Shaky  Follow-up CBG: Time: CBG Result:  Possible Reasons for Event: Inadequate meal intake  Comments/MD notified:    [name]

## 2020-02-11 IMAGING — CT CT ABD-PELV W/ CM
2 of 5 series · 15 of 46 positions shown, 17 images · IV contrast (APPLIED)
Comparison: 08/25/2017, 08/08/2017, 06/13/2017, 07/29/2016 and
10/30/2015 CT.

CLINICAL DATA: 51-year-old male with chronic pancreatitis. Due to
have drainage of pancreatic pseudocyst. Subsequent encounter.

EXAM:
CT ABDOMEN AND PELVIS WITH CONTRAST
TECHNIQUE: Multidetector CT imaging of the abdomen and pelvis was performed
using the standard protocol following bolus administration of
intravenous contrast.
CONTRAST:  100mL [id number] IOPAMIDOL ([id number]) INJECTION 61%

[Series 3: abd/ pelvis 5.0 i30f 2 · axial · 0.82mm/px · z∈[-996,-580]mm · 12 of 93 slices shown, 14 images]
[im 5/93  soft-tissue]
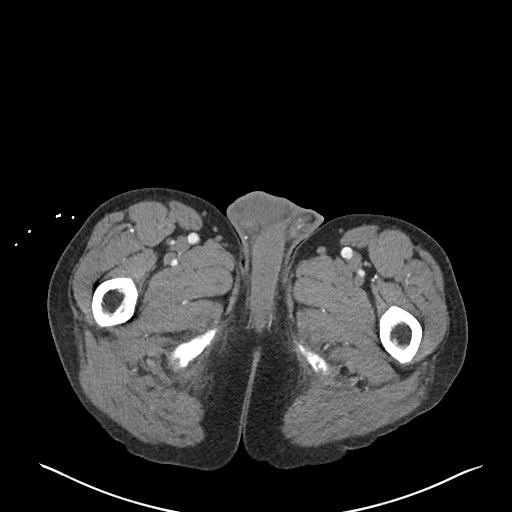
[im 5/93  bone]
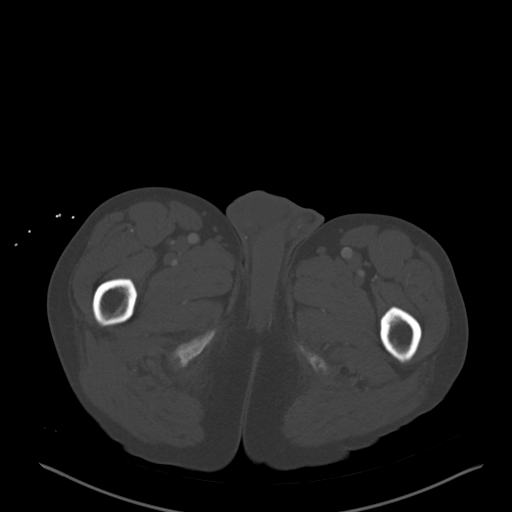
[im 14/93  soft-tissue]
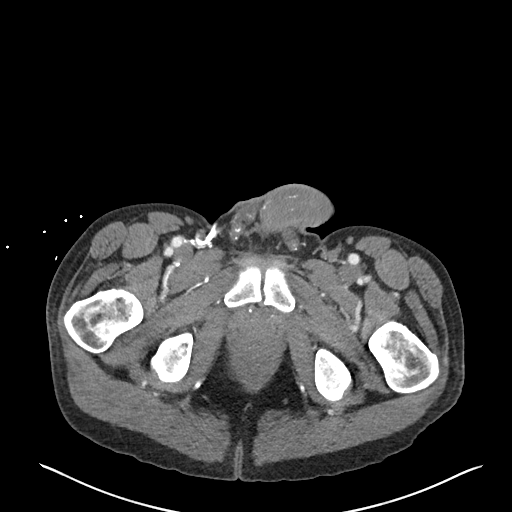
[im 22/93  soft-tissue]
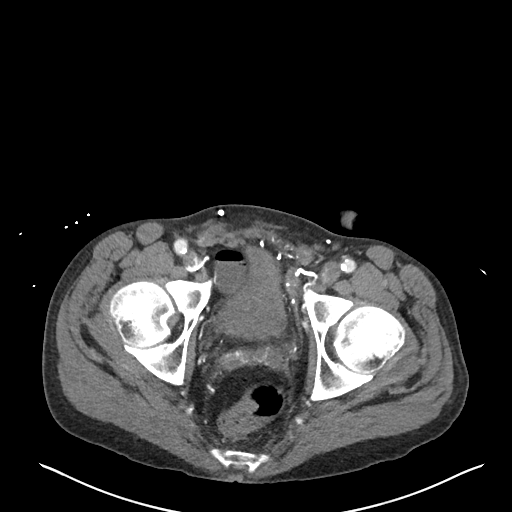
[im 27/93  soft-tissue]
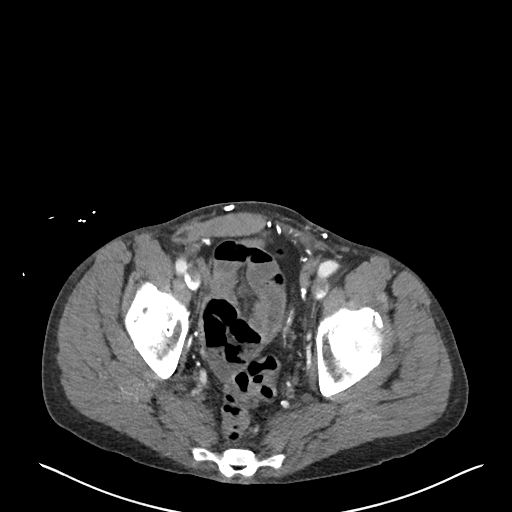
[im 36/93  soft-tissue]
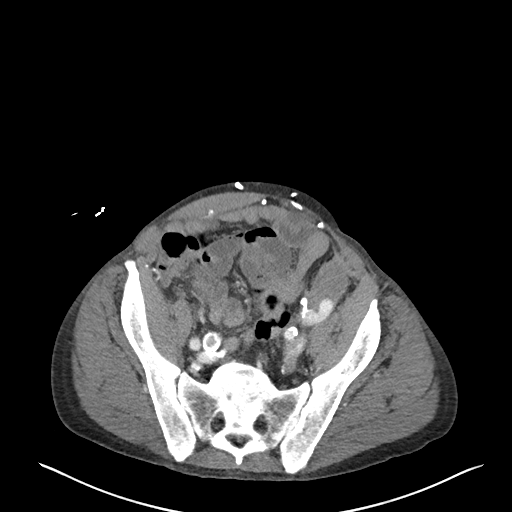
[im 44/93  soft-tissue]
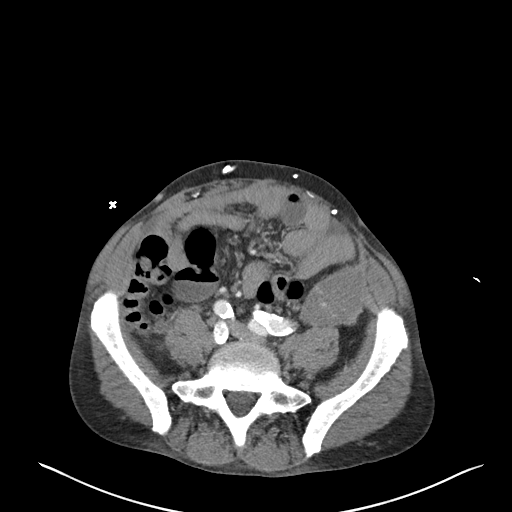
[im 49/93  soft-tissue]
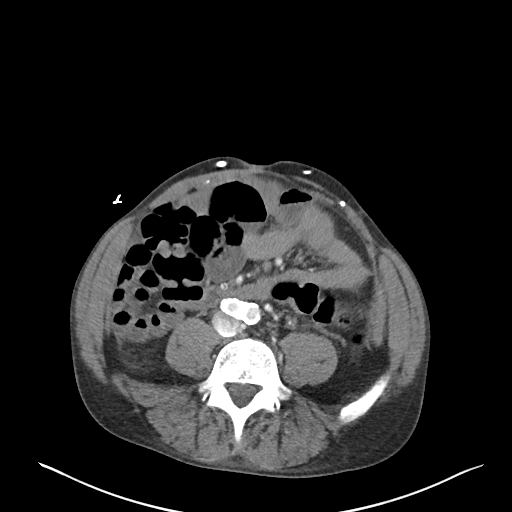
[im 57/93  soft-tissue]
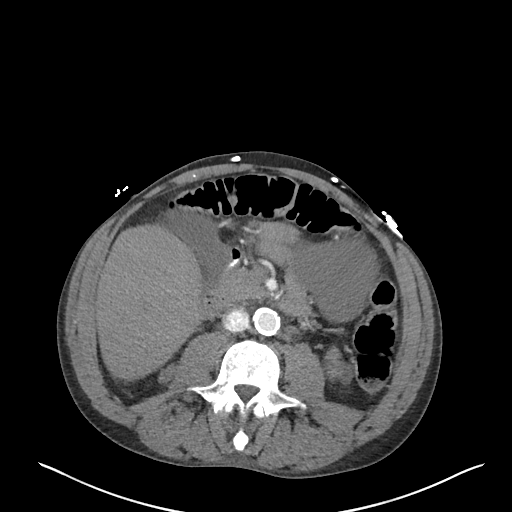
[im 66/93  soft-tissue]
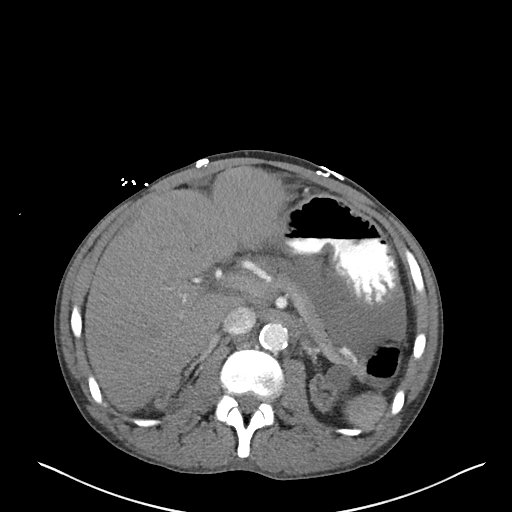
[im 66/93  bone]
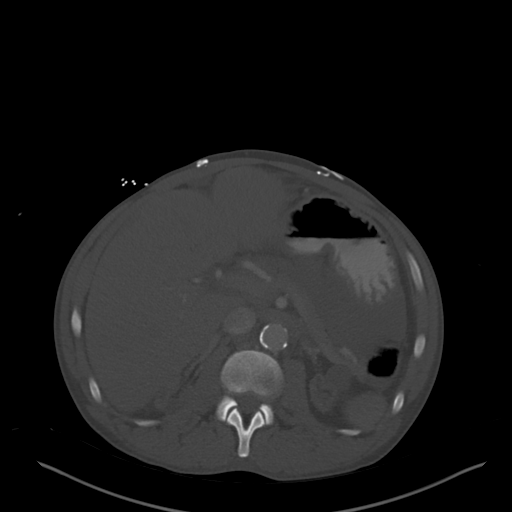
[im 71/93  soft-tissue]
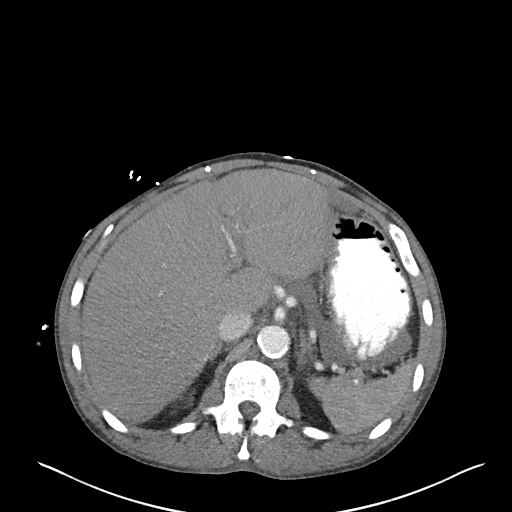
[im 79/93  soft-tissue]
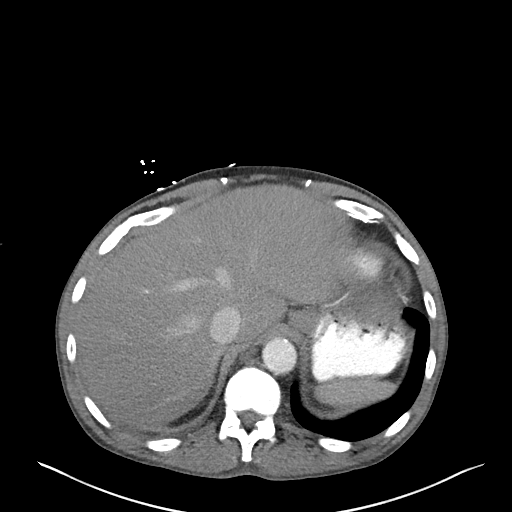
[im 88/93  soft-tissue]
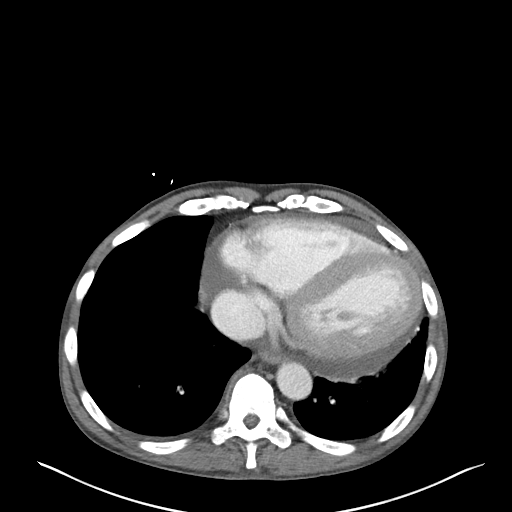

[Series 6: coronal soft tissue · coronal · 0.72mm/px · 3 of 100 slices shown]
[im 34/100  soft-tissue]
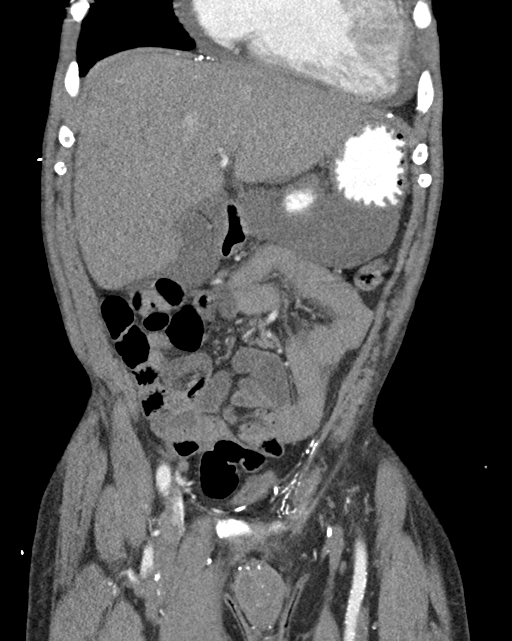
[im 45/100  soft-tissue]
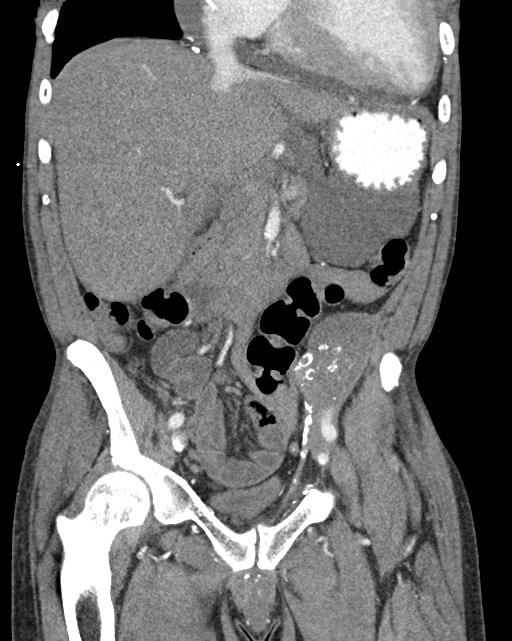
[im 56/100  soft-tissue]
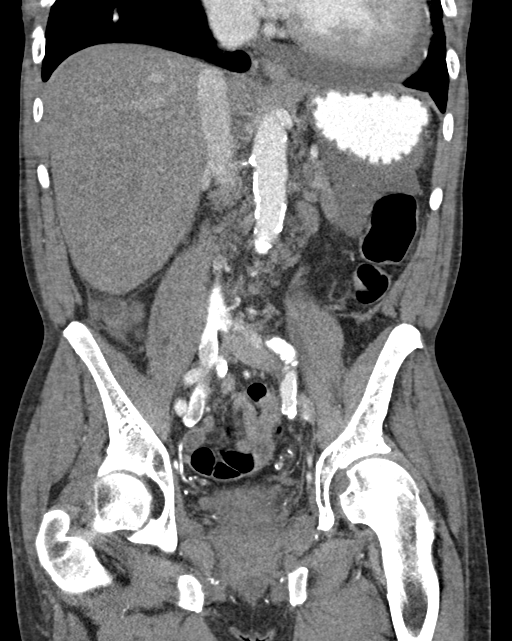

[15 of 46 positions shown; findings below may reference images not displayed]

FINDINGS: Lower chest: Cardiomegaly. Pericardial effusion similar in size to
most recent exam with maximal thickness 2.6 cm. Minimal basilar
atelectasis.

Hepatobiliary: Enlarged liver spanning over 18.7 cm with prominent
left lobe which can be seen with early cirrhosis. Poor enhancement
without worrisome hepatic lesion.

No calcified gallstones. Small amount of ascites and therefore
cannot evaluate for gallbladder inflammatory process.

Pancreas: Compressed by lesser sac fluid collection measuring 14 x
7.2 x 8.7 cm, without significant change.

Spleen: No focal mass or enlargement.

Adrenals/Urinary Tract: Atrophic native kidneys. 3.9 cm complex
indeterminate left renal structure once again noted.

Failed renal transplant left renal pelvis.

Stomach/Bowel: Stomach compressed by lesser sac collection.

Sigmoid diverticula.

Appendix not visualized

Evaluation of bowel limited slightly by under distension and third
spacing of fluid but no primary bowel inflammatory process noted.

Vascular/Lymphatic: Prominent atherosclerotic changes abdominal
aorta without aneurysm. Prominent atherosclerotic changes iliac
arteries. Bulge proximal left common iliac artery measuring 1.8 x
2.3 cm without change. Dilated left external iliac artery proximal
to the failed transplant measures up to 1.5 cm and without change.

No adenopathy.

Reproductive: No worrisome abnormality.

Other: No free intraperitoneal air.

Musculoskeletal: Changes of renal osteodystrophy.
IMPRESSION: Lesser sac fluid collection measuring 14 x 7.2 x 8.7 cm, without
significant change. This compresses the stomach and pancreas.

Atrophic native kidneys. 3.9 cm complex indeterminate left renal
structure once again noted.

Failed renal transplant left renal pelvis.

Aortic Atherosclerosis ([id number]). Bulge proximal left common
iliac artery measuring 1.8 x 2.3 cm without change. Dilated left
external iliac artery proximal to the failed transplant measures up
to 1.5 cm and without change.

Cardiomegaly. Pericardial effusion similar in size to most recent
exam with maximal thickness 2.6 cm.

Enlarged liver spanning over 18.7 cm with prominent left lobe which
can be seen with early cirrhosis.

Third spacing of fluid/minimal amount of ascites.

## 2020-02-11 MED ORDER — AMLODIPINE BESYLATE 10 MG PO TABS
10.0000 mg | ORAL_TABLET | Freq: Every day | ORAL | Status: DC
  Filled 2020-02-11: qty 1

## 2020-02-11 MED ORDER — HYDRALAZINE HCL 50 MG PO TABS
100.0000 mg | ORAL_TABLET | Freq: Three times a day (TID) | ORAL | Status: DC
  Filled 2020-02-11 (×3): qty 2

## 2020-02-11 NOTE — TOC Initial Note (Signed)
Transition of Care [facility]) - Initial/Assessment Note    Patient Details  Name: Frank [name] MRN: [id number] Date of Birth: 05/07/1966  Transition of Care [facility]) CM/SW Contact:    Bartholomew Crews, RN Phone Number: [phone] 02/11/2020, 1:01 PM  Clinical Narrative:                  Acknowledging consult for outpatient behavioral health appointment. Unable to schedule outpatient appointment d/t closing at 1pm on Fridays and scheduling no longer accepting calls. Contact information placed on AVS, and discussed follow up with patient at the bedside. Patient expressed appreciation for information and states that he will follow up.   Patient states that he has an appointment with [facility] on 02/16/2020 to follow up about his cpap.   Patient states that his son will provide transportation home at discharge.   TOC following for transition needs.   Expected Discharge Plan: Home/Self Care Barriers to Discharge: Continued Medical Work up   Patient Goals and CMS Choice   CMS Medicare.gov Compare Post Acute Care list provided to:: Patient Choice offered to / list presented to : Patient  Expected Discharge Plan and Services Expected Discharge Plan: Home/Self Care In-house Referral: NA Discharge Planning Services: CM Consult Post Acute Care Choice: NA Living arrangements for the past 2 months: Single Family Home Expected Discharge Date:  (unknown)               DME Arranged: N/A DME Agency: NA       HH Arranged: NA HH Agency: NA        Prior Living Arrangements/Services Living arrangements for the past 2 months: Single Family Home Lives with:: Self Patient language and need for interpreter reviewed:: Yes        Need for Family Participation in Patient Care: No (Comment)   Current home services: DME Criminal Activity/Legal Involvement Pertinent to Current Situation/Hospitalization: No - Comment as needed  Activities of Daily Living Home Assistive Devices/Equipment: CPAP,  Nebulizer (tub/shower unit) ADL Screening (condition at time of admission) Patient's cognitive ability adequate to safely complete daily activities?: Yes Is the patient deaf or have difficulty hearing?: No Does the patient have difficulty seeing, even when wearing glasses/contacts?: No Does the patient have difficulty concentrating, remembering, or making decisions?: No Patient able to express need for assistance with ADLs?: Yes Does the patient have difficulty dressing or bathing?: No Independently performs ADLs?: Yes (appropriate for developmental age) Does the patient have difficulty walking or climbing stairs?: No Weakness of Legs: None Weakness of Arms/Hands: None  Permission Sought/Granted                  Emotional Assessment Appearance:: Appears stated age Attitude/Demeanor/Rapport: Engaged Affect (typically observed): Accepting Orientation: : Oriented to Self, Oriented to  Time, Oriented to Place, Oriented to Situation Alcohol / Substance Use: Not Applicable Psych Involvement: Outpatient Provider  Admission diagnosis:  Acute pulmonary edema (HCC) [J81.0] Prolonged Q-T interval on ECG [R94.31] Acute exacerbation of CHF (congestive heart failure) ([place]) [I50.9] Patient Active Problem List   Diagnosis Date Noted  . Acute exacerbation of CHF (congestive heart failure) ([place]) 02/09/2020  . COPD with acute exacerbation ([place])   . Thrombocytopenia ([place])   . Alkaline phosphatase elevation   . Fluid overload 01/22/2020  . Left hip pain   . ESRD (end stage renal disease) ([place]) 07/19/2019  . GI bleed 06/17/2019  . Acute blood loss anemia 06/17/2019  . Acute pancreatitis 05/28/2019  . Hypertensive urgency 05/28/2019  .  Uremia 05/17/2019  . Pancreatitis, acute 05/09/2019  . Intractable nausea and vomiting 04/19/2019  . Chronic abdominal pain 04/12/2019  . Volume overload 03/11/2019  . Pneumothorax, right   . Malnutrition of moderate degree 07/29/2018  . Chest tube in  place   . Chronic, continuous use of opioids 07/28/2018  . Chest pain   . Chronic vomiting 07/26/2018  . History of Clostridioides difficile infection 07/26/2018  . Empyema of right pleural space ([place]) 07/26/2018  . Chronic pancreatitis ([place]) 05/09/2018  . Foot pain, right 04/25/2018  . Dialysis patient, noncompliant ([place]) 03/05/2018  . DNR (do not resuscitate) discussion   . Hydropneumothorax 01/31/2018  . Hyperkalemia 01/25/2018  . PE (pulmonary thromboembolism) ([place]) 01/16/2018  . Benign hypertensive heart and kidney disease with systolic CHF, NYHA class 3 and CKD stage 5 ([place])   . End-stage renal disease on hemodialysis ([place])   . Cirrhosis ([place])   . Pancreatic pseudocyst   . Acute on chronic pancreatitis ([place]) 08/09/2017  . ESRD needing dialysis ([place]) 05/26/2017  . Marijuana abuse 04/21/2017  . History of DVT (deep vein thrombosis) 03/11/2017  . Aortic atherosclerosis ([place]) 01/05/2017  . GERD (gastroesophageal reflux disease) 05/29/2016  . Nonischemic cardiomyopathy ([place]) 01/09/2016  . Chronic pain   . Recurrent abdominal pain   . Left renal mass 10/30/2015  . Acute on chronic systolic congestive heart failure ([place]) 09/23/2015  . Acute respiratory failure with hypoxia ([place]) 09/08/2015  . Recurrent chest pain 09/08/2015  . Essential hypertension 01/02/2015  . Dyslipidemia   . Pulmonary hypertension ([place])   . Acute pulmonary edema (HCC)   . DM (diabetes mellitus), type 2, uncontrolled, with renal complications ([place])   . History of pulmonary embolism 05/08/2014  . Complex sleep apnea syndrome 05/05/2014  . Anemia associated with chronic renal failure 06/24/2013  . Nausea vomiting and diarrhea 06/24/2013   PCP:  Patient, [name] Pharmacy:   [id number] [place] [address] [id number]: [phone]: [phone] [facility]- [place] [place]Phone: [phone]: [phone] [place] [place] [address]Phone: [phone]: [phone]    Social Determinants of Health (SDOH) Interventions    Readmission Risk Interventions No flowsheet data found.

## 2020-02-11 NOTE — Progress Notes (Signed)
[name]: [phone]  Patient name: BLAND RUDZINSKI Medical record number: [id number] Date of birth: Apr 05, 1966 Age: 54 y.o. Gender: male  Primary Care Provider: Patient, [name] Consultants: Nephro Code Status: FULL  Pt Overview and Major Events to Date:  Admitted from [facility] 02/09/2020- needed HD for dialysis  Assessment and Plan: Atypical chest pain likely 2/2 HF exacerbation - resolved Patient no longer reports chest pain. Patient refused repeat EKG. He will receive another HD treatment today to get back on schedule. - f/u repeat EKG - nephro consulted, appreciate recommendations - HD - Scopolamine patch - Start protonix  Suspected acute on chronic HFmEF- stable Repeat echo was unchanged from the 01/28/2020 echo with an improved RA pressure. Patient continues to have large pericardial effusion that does not suggest tamponade. Patient sounded clear on exam. - Monitor daily weights  - strict I/Os - HD - Duonebs PRN  ESRD on HD MWF Patient will restart his on-schedule dialysis today. Nephro was consulted concerning patient needed routine Dilaudid for each HD treatment for abdominal pain. There is concern that for possible Mesenteric ischemia. CTA abdomen is recommended in the future. - HD again today  HTN BP 115/51-202/59. Home meds include amlodopine, hydralazine, carvedilol,  -Continue home med: carvedilol - Restart all home meds  Chronic pancreatitis pain- stable Patient has home med oxycodone. He often takes Dilaudid for severe pain and with HD. - Dilaudid PRN - oxycodone scheduled -  Continue Senna docusate   FEN/GI: Renal carb modified PPx: Eliquis  Disposition: Home  Subjective:  Patient had no emesis or events overnight. Patient does not reports pain at this time nor SOB. Patient did not receive CPAP overnight, but appears stable.  Objective: Temp:  [97.4 F (36.3 C)-98.6 F (37 C)] 98.6 F (37  C) (07/22 2029) Pulse Rate:  [57-87] 61 (07/22 2029) Resp:  [17-20] 17 (07/22 2029) BP: (115-202)/(51-106) 149/106 (07/22 2029) SpO2:  [94 %-100 %] 98 % (07/22 2029) Weight:  [87 kg] 87 kg (07/22 2029) Physical Exam: General: Resting in bed Cardiovascular: Regular rate and rythym, no murmur detected Respiratory: Clear and equal to bil. Asucl. Good work of breathing noted Abdomen: Normoactive bowel sounds, no pain to palpation Extremities: Distal extremities have mild non-pitting edema, hard to fel distal pulses  Laboratory: Recent Labs  Lab 02/09/20 0453 02/10/20 0815  WBC 5.0 4.6  HGB 9.0* 8.9*  HCT 28.6* 29.6*  PLT 138* 135*   Recent Labs  Lab 02/09/20 0453 02/10/20 0815  NA 140 139  K 4.9 5.5*  CL 99 100  CO2 25 23  BUN 91* 101*  CREATININE 12.08* 14.38*  CALCIUM 8.8* 7.9*  GLUCOSE 80 72      Imaging/Diagnostic Tests: 02/10/2020- Echo Left Ventricle: Left ventricular ejection fraction, by estimation, is 40  to 45%. The left ventricle has mildly decreased function. The left  ventricle demonstrates regional wall motion abnormalities. The left  ventricular internal cavity size was normal  in size. There is severe left ventricular hypertrophy.   Right Ventricle: The right ventricular size is moderately enlarged. Right  vetricular wall thickness was not assessed. Right ventricular systolic  function is moderately reduced.   Pericardium: Large effusion, measures up to 2.3 cm adjacent to LV lateral  wall. No RA/RV collapse seen to suggest tamponade. A large pericardial  effusion is present.   Venous: The inferior vena cava is normal in size with less than 50%  respiratory variability, suggesting right atrial  pressure of 8 mmHg.   Freida Busman, MD 02/11/2020, 7:01 AM PGY-1, [place] Intern pager: [phone], text pages welcome

## 2020-02-11 NOTE — Progress Notes (Signed)
Pt refusing cpap for the night. RT will continue to monitor as needed. 

## 2020-02-11 NOTE — Progress Notes (Signed)
FPTS Interim Progress Note  Paged that patient requesting to speak to Doctor.  Patient seen at bedside.  Patient states that he needs IV dilaudid in order to tolerate HD.  He states that oxycodone does not help his pain.  He states that he has been taking IV dilaudid to get through HD sessions for 22 years.  Advised the patient that he needs to take his home oxycodone and see if this will help.  He states that he has "pancreatitis" and that dilaudid is the only thing that will help.  Advised that it would be inappropriate to give him dilaudid at this time and for him to try his home oxycodone.  He states that he will try to do HD as long as possible.  [place], DO 02/11/2020, 8:16 PM PGY-3, [place] Service pager [phone]

## 2020-02-11 NOTE — Progress Notes (Signed)
1. Subjective: reports tolerating hd" Off at 4 am" ,noted 3132 cc uf , abd pain better , "maybe home after hd today , Refuses HD @ OP  Unit today .thinks he will feel ok for dc after hd   Objective Vital signs in last 24 hours: Vitals:   02/10/20 1134 02/10/20 1740 02/10/20 2029 02/11/20 0805  BP: (!) 202/59 (!) 131/53 (!) 149/106   Pulse: 78 57 61   Resp: _0 Temp: 97.7 F (36.5 C) 97.8 F (36.6 C) 98.6 F (37 C)   TempSrc: Oral Oral    SpO2: 94% 100% 98% 96%  Weight:   87 kg   Height:       Weight change:   Physical Exam: General: alert NAD  Heart: RRR, no rub Lungs: CTA, non labored  Abdomen: BS  Pos , soft , NT, Minimal  epigastric tenderness, no rebound  Extremities: Dialysis Access: LIJ  Perm cath/ L fA AVf pos bruit    Dialysis Orders: [facility] MWF 4 hours  EDW 79 kg  400 BF, 800 DF Last tx of 7/17 with post weight of 84.5 kg  2K /2 Ca Meds: mircera 200 mcg every 2 weeks last given on 7/14; venofer 50 mg weekly  Problem/Plan: #Chest pain and dyspnea 2/2 volume overload 2/2 missed Hd  -Continue to Optimize volume with dialysis -Other work-up per primary team  #End-stage renal disease -HD off schedule yesterday  ,  hd today  perMWFschedule  # Acute on chronic combined systolic and diastolic CHF with vol ^ with missed HD  - optimize volume with HD as above  # HTN  - optimize volume status with HD and adjusting anti-hypertensives as per BP    # pericardial effusion - noted repeat TTE ordered per primary team , Card  Did see but no wu as in pt  No tamponade   #Anemia of CKD -  HGB  8.9 , prob some  Dilutional effect of vol , last had mircera on 7/14 (200 mcg) - not yet due for redose   #Metabolic bone disease - Last PTH 2038 but not on activated vit D outpatient. See hx of calciphylaxis documented. Continue binders    #Abd pain = chronic problem  with chronic Pancreatitis  That gets exacerbated  when uremic from missing txs  -  yest 1 time dilaudid 0.59m IV ordered and d/w RN, pt says unable to tolerate chronic po opioid due to nausea currently.    DErnest Haber PA-C [facility] Beeper [phone]/23/2021,9:51 AM  LOS: 2 days   Labs: Basic Metabolic Panel: Recent Labs  Lab 02/09/20 0453 02/10/20 0815  NA 140 139  K 4.9 5.5*  CL 99 100  CO2 25 23  GLUCOSE 80 72  BUN 91* 101*  CREATININE 12.08* 14.38*  CALCIUM 8.8* 7.9*   Liver Function Tests: No results for input(s): AST, ALT, ALKPHOS, BILITOT, PROT, ALBUMIN in the last 168 hours. No results for input(s): LIPASE, AMYLASE in the last 168 hours. No results for input(s): AMMONIA in the last 168 hours. CBC: Recent Labs  Lab 02/09/20 0453 02/10/20 0815  WBC 5.0 4.6  HGB 9.0* 8.9*  HCT 28.6* 29.6*  MCV 87.7 87.8  PLT 138* 135*   Cardiac Enzymes: No results for input(s): CKTOTAL, CKMB, CKMBINDEX, TROPONINI in the last 168 hours. CBG: Recent Labs  Lab 02/10/20 0940 02/10/20 1159 02/10/20 1247 02/10/20 1350 02/10/20 2028  GLUCAP 64* 64* 63* 91 93  Studies/Results: ECHOCARDIOGRAM LIMITED  Result Date: 02/10/2020    ECHOCARDIOGRAM LIMITED REPORT   Patient Name:   Frank [name] Date of Exam: 02/10/2020 Medical Rec #:  [id number]    Height:       74.0 in Accession #:    [id number]   Weight:       191.8 lb Date of Birth:  Sep 01, 1965     BSA:          2.135 m Patient Age:    54 years     BP:           196/74 mmHg Patient Gender: M            HR:           79 bpm. Exam Location:  Inpatient Procedure: Limited Echo, Cardiac Doppler and Color Doppler Indications:    CHF  History:        Patient has prior history of Echocardiogram examinations, most                 recent 01/28/2020. CHF, Pericardial Disease, Signs/Symptoms:ESRD;                 Risk Factors:Hypertension.  Sonographer:    Dustin Flock Referring Phys: [id number] [place]  1. Left ventricular ejection fraction, by estimation, is 40 to 45%. The left ventricle has  mildly decreased function. The left ventricle demonstrates regional wall motion abnormalities. Septal/inferior hypokinesis. There is severe left ventricular hypertrophy.  2. Right ventricular systolic function is moderately reduced. The right ventricular size is moderately enlarged.  3. The inferior vena cava is normal in size with <50% respiratory variability, suggesting right atrial pressure of 8 mmHg.  4. Large pericardial effusion, measures up to 2.3 cm adjacent to LV lateral wall. IVC is collapsible and no RA/RV collapse seen to suggest tamponade. FINDINGS  Left Ventricle: Left ventricular ejection fraction, by estimation, is 40 to 45%. The left ventricle has mildly decreased function. The left ventricle demonstrates regional wall motion abnormalities. The left ventricular internal cavity size was normal in size. There is severe left ventricular hypertrophy. Right Ventricle: The right ventricular size is moderately enlarged. Right vetricular wall thickness was not assessed. Right ventricular systolic function is moderately reduced. Pericardium: Large effusion, measures up to 2.3 cm adjacent to LV lateral wall. No RA/RV collapse seen to suggest tamponade. A large pericardial effusion is present. Venous: The inferior vena cava is normal in size with less than 50% respiratory variability, suggesting right atrial pressure of 8 mmHg. LEFT VENTRICLE PLAX 2D LVIDd:         4.60 cm Diastology LVIDs:         3.40 cm LV e' lateral:   12.60 cm/s LV PW:         1.50 cm LV E/e' lateral: 2.8 LV IVS:        1.50 cm LV e' medial:    8.05 cm/s                        LV E/e' medial:  4.4  MITRAL VALVE MV Area (PHT): 4.31 cm MV Decel Time: 176 msec MV E velocity: 35.58 cm/s MV A velocity: 59.10 cm/s MV E/A ratio:  0.60 Oswaldo Milian MD Electronically signed by Oswaldo Milian MD Signature Date/Time: 02/10/2020/9:01:45 PM    Final    Medications:  sodium chloride      apixaban  5 mg Oral BID   carvedilol  25  mg Oral BID   Chlorhexidine Gluconate Cloth  6 each Topical Q0600   ferrous sulfate  325 mg Oral Daily   lidocaine  2 patch Transdermal Q24H   pantoprazole  40 mg Oral Daily   scopolamine  1 patch Transdermal Q72H   senna-docusate  2 tablet Oral QHS   sodium chloride flush  3 mL Intravenous Q12H   temazepam  15 mg Oral QHS   umeclidinium bromide  1 puff Inhalation Daily

## 2020-02-11 NOTE — Progress Notes (Signed)
PT Cancellation Note  Patient Details Name: Frank [name] MRN: [id number] DOB: 11-Sep-1965   Cancelled Treatment:    Reason Eval/Treat Not Completed: Patient declined, no reason specified;Fatigue/lethargy limiting ability to participate. Pt sleeping upon arrival and very lethargic. Introduced self and purpose of visit; however, pt not opening eyes and just mumbling "dialysis, I had dialysis". Attempted to ask pt further questions but pt unable to maintain arousal level. PT will continue to f/u with pt acutely as available and appropriate.    Clearnce Sorrel Alazae Crymes 02/11/2020, 8:42 AM

## 2020-02-11 NOTE — Progress Notes (Signed)
Interim Progress Note FPTS  Received a page from RN that patient was refusing Oxy for pain. He was requesting IV Dilaudid. Called into the patient's room. Patient again asked for IV Dilaudid and reported that the "pill doesn't do anything." The patient reported that his pain in his back and chest had returned. Of note, the patient has not gone to HD session, yet today. He is aware and wanted the IV Dilaudid before he went. Patient reported that althought the Oxy was part of his home medication regimen it does not do anything, yet has continued to fill it. He also reported that he has told the provider who fills the medication and that she is reportedly going to "increase it next time." I told patient that I cannot give him IV Dilaudid again especially if he is refusing Oxy. Patient hung up.

## 2020-02-12 LAB — CBC
HCT: 27.9 % — ABNORMAL LOW (ref 39.0–52.0)
Hemoglobin: 8.7 g/dL — ABNORMAL LOW (ref 13.0–17.0)
MCH: 27 pg (ref 26.0–34.0)
MCHC: 31.2 g/dL (ref 30.0–36.0)
MCV: 86.6 fL (ref 80.0–100.0)
Platelets: 129 10*3/uL — ABNORMAL LOW (ref 150–400)
RBC: 3.22 MIL/uL — ABNORMAL LOW (ref 4.22–5.81)
RDW: 18.9 % — ABNORMAL HIGH (ref 11.5–15.5)
WBC: 3.7 10*3/uL — ABNORMAL LOW (ref 4.0–10.5)
nRBC: 0 % (ref 0.0–0.2)

## 2020-02-12 LAB — RENAL FUNCTION PANEL
Albumin: 3.1 g/dL — ABNORMAL LOW (ref 3.5–5.0)
Anion gap: 16 — ABNORMAL HIGH (ref 5–15)
BUN: 65 mg/dL — ABNORMAL HIGH (ref 6–20)
CO2: 23 mmol/L (ref 22–32)
Calcium: 8.3 mg/dL — ABNORMAL LOW (ref 8.9–10.3)
Chloride: 99 mmol/L (ref 98–111)
Creatinine, Ser: 11.05 mg/dL — ABNORMAL HIGH (ref 0.61–1.24)
GFR calc Af Amer: 5 mL/min — ABNORMAL LOW (ref >60)
GFR calc non Af Amer: 5 mL/min — ABNORMAL LOW (ref >60)
Glucose, Bld: 86 mg/dL (ref 70–99)
Phosphorus: 6.4 mg/dL — ABNORMAL HIGH (ref 2.5–4.6)
Potassium: 4.6 mmol/L (ref 3.5–5.1)
Sodium: 138 mmol/L (ref 135–145)

## 2020-02-12 LAB — GLUCOSE, CAPILLARY
Glucose-Capillary: 84 mg/dL (ref 70–99)
Glucose-Capillary: 79 mg/dL (ref 70–99)

## 2020-02-12 MED ORDER — HYDROMORPHONE HCL 1 MG/ML IJ SOLN
0.5000 mg | Freq: Once | INTRAMUSCULAR | Status: AC
  Administered 2020-02-12: 0.5 mg via INTRAVENOUS

## 2020-02-12 MED ORDER — HEPARIN SODIUM (PORCINE) 1000 UNIT/ML IJ SOLN
INTRAMUSCULAR | Status: AC
  Administered 2020-02-12: 1000 [IU]
  Filled 2020-02-12: qty 6

## 2020-02-12 MED ORDER — HYDROMORPHONE HCL 1 MG/ML IJ SOLN
INTRAMUSCULAR | Status: AC
  Filled 2020-02-12: qty 0.5

## 2020-02-12 NOTE — Discharge Instructions (Signed)
Dear Frank [name],  Thank you for letting us participate in your care.  POST-HOSPITAL & CARE INSTRUCTIONS 1. Go to your HD sessions please do not miss them. 2. You will need to continue to follow up with cardiology 3. Go see your PCP for a hospital follow up in 1-2 days 4. Go to your follow up appointments (listed below)  DOCTOR'S APPOINTMENT   Future Appointments  Date Time Provider [place]  02/15/2020  2:15 PM Deberah Pelton, NP [facility]  02/17/2020 10:50 AM Argentina Donovan, PA-C [facility] None  02/17/2020  1:50 PM [facility] 2 [facility]    Follow-up [place]. Schedule an appointment as soon as possible for a visit.   Contact information: [phone]  Emergency crisis [phone]              Take care and be well!  [facility]  [place], [place] [address] [phone]

## 2020-02-12 NOTE — Progress Notes (Signed)
PT Cancellation Note  Patient Details Name: Frank [name] MRN: [id number] DOB: [birth year]   Cancelled Treatment:    Reason Eval/Treat Not Completed: Patient at procedure or test/unavailable. Transport arriving to take pt to HD. PT will continue to f/u with pt acutely as available.    Clearnce Sorrel [name] 02/12/2020, 11:43 AM

## 2020-02-12 NOTE — Plan of Care (Signed)
  Problem: Education: Goal: Knowledge of disease and its progression will improve Outcome: Progressing

## 2020-02-12 NOTE — Progress Notes (Addendum)
Subjective:  Noted yest requesting iv Dilaudid  before hd  In setting of possible dc , this am agrees to hd 3.5 l uf  And possible dc .  Objective Vital signs in last 24 hours: Vitals:   02/10/20 2029 02/11/20 0805 02/11/20 1618 02/12/20 0700  BP: (!) 149/106  (!) 119/105 (!) 153/85  Pulse: 61  62 64  Resp: _0 Temp:   98.8 F (37.1 C) (!) 97.5 F (36.4 C)  TempSrc:   Oral Oral  SpO2: 98% 96% 96% 96%  Weight: 87 kg     Height:       Weight change:   Physical Exam: General: Alert sitting on side of bed, NAD Heart: RRR, no rub  lungs: Left base faint Rales, nonlabored breathing Abdomen: Bowel sounds normoactive soft nontender Extremities: Bilateral woody edema Dialysis Access: Left IJ PermCath, left forearm AV fistula plus bruit  Dialysis Orders: [facility] MWF 4 hours  EDW 79 kg  400 BF, 800 DF Last tx of 7/17 with post weight of 84.5 kg  2K /2 Ca Meds: mircera 200 mcg every 2 weeks last given on 7/14; venofer 50 mg weekly  Problem/Plan: #Chest pain and dyspnea 2/2 volume overload 2/2 missed Hd  -Continue to Optimize volume with dialysis -Other work-up per primary team  #End-stage renal disease -HDoff schedule  on admit secondary to missed dialysis,   requesting IV Dilaudid be for dialysis yesterday  not given hemodialysis today then possible discharge home, , usual MWFschedule  # Acute on chronic combined systolic and diastolic CHF with vol ^ with missed HD  - optimize volume with HD as above  # HTN  - optimize volume status with HD and adjusting anti-hypertensives as per BP    # pericardial effusion - noted repeat TTE ordered per primary team , Card  Did see but no wu as in pt  No tamponade   #Anemia of CKD -  HGB  8.9 on 7/22, prob some  Dilutional effect of vol , last had mircera on 7/14 (200 mcg) - not yet due for redose   #Metabolic bone disease - Last PTH 2038 but not on activated vit D outpatient. See hx of calciphylaxis  documented. Continue binders  #Abd pain = chronic problem  with chronic Pancreatitis  That gets exacerbated  when uremic from missing txs /further work-up per admitting -Pain meds per admit  Ernest Haber, PA-C [place] [phone] 02/12/2020,8:56 AM  LOS: 3 days   Labs: Basic Metabolic Panel: Recent Labs  Lab 02/09/20 0453 02/10/20 0815  NA 140 139  K 4.9 5.5*  CL 99 100  CO2 25 23  GLUCOSE 80 72  BUN 91* 101*  CREATININE 12.08* 14.38*  CALCIUM 8.8* 7.9*   Liver Function Tests: No results for input(s): AST, ALT, ALKPHOS, BILITOT, PROT, ALBUMIN in the last 168 hours. No results for input(s): LIPASE, AMYLASE in the last 168 hours. No results for input(s): AMMONIA in the last 168 hours. CBC: Recent Labs  Lab 02/09/20 0453 02/10/20 0815  WBC 5.0 4.6  HGB 9.0* 8.9*  HCT 28.6* 29.6*  MCV 87.7 87.8  PLT 138* 135*   Cardiac Enzymes: No results for input(s): CKTOTAL, CKMB, CKMBINDEX, TROPONINI in the last 168 hours. CBG: Recent Labs  Lab 02/10/20 0940 02/10/20 1159 02/10/20 1247 02/10/20 1350 02/10/20 2028  GLUCAP 64* 64* 63* 91 93    Studies/Results: ECHOCARDIOGRAM LIMITED  Result Date: 02/10/2020    ECHOCARDIOGRAM LIMITED REPORT  Patient Name:   Frank [name] Date of Exam: 02/10/2020 Medical Rec #:  [id number]    Height:       74.0 in Accession #:    [id number]   Weight:       191.8 lb Date of Birth:  [birth year]     BSA:          2.135 m Patient Age:    54 years     BP:           196/74 mmHg Patient Gender: M            HR:           79 bpm. Exam Location:  Inpatient Procedure: Limited Echo, Cardiac Doppler and Color Doppler Indications:    CHF  History:        Patient has prior history of Echocardiogram examinations, most                 recent 01/28/2020. CHF, Pericardial Disease, Signs/Symptoms:ESRD;                 Risk Factors:Hypertension.  Sonographer:    Dustin Flock Referring Phys: [id number] [facility]  1. Left  ventricular ejection fraction, by estimation, is 40 to 45%. The left ventricle has mildly decreased function. The left ventricle demonstrates regional wall motion abnormalities. Septal/inferior hypokinesis. There is severe left ventricular hypertrophy.  2. Right ventricular systolic function is moderately reduced. The right ventricular size is moderately enlarged.  3. The inferior vena cava is normal in size with <50% respiratory variability, suggesting right atrial pressure of 8 mmHg.  4. Large pericardial effusion, measures up to 2.3 cm adjacent to LV lateral wall. IVC is collapsible and no RA/RV collapse seen to suggest tamponade. FINDINGS  Left Ventricle: Left ventricular ejection fraction, by estimation, is 40 to 45%. The left ventricle has mildly decreased function. The left ventricle demonstrates regional wall motion abnormalities. The left ventricular internal cavity size was normal in size. There is severe left ventricular hypertrophy. Right Ventricle: The right ventricular size is moderately enlarged. Right vetricular wall thickness was not assessed. Right ventricular systolic function is moderately reduced. Pericardium: Large effusion, measures up to 2.3 cm adjacent to LV lateral wall. No RA/RV collapse seen to suggest tamponade. A large pericardial effusion is present. Venous: The inferior vena cava is normal in size with less than 50% respiratory variability, suggesting right atrial pressure of 8 mmHg. LEFT VENTRICLE PLAX 2D LVIDd:         4.60 cm Diastology LVIDs:         3.40 cm LV e' lateral:   12.60 cm/s LV PW:         1.50 cm LV E/e' lateral: 2.8 LV IVS:        1.50 cm LV e' medial:    8.05 cm/s                        LV E/e' medial:  4.4  MITRAL VALVE MV Area (PHT): 4.31 cm MV Decel Time: 176 msec MV E velocity: 35.58 cm/s MV A velocity: 59.10 cm/s MV E/A ratio:  0.60 Oswaldo Milian MD Electronically signed by Oswaldo Milian MD Signature Date/Time: 02/10/2020/9:01:45 PM    Final     Medications: . sodium chloride     . amLODipine  10 mg Oral Daily  . apixaban  5 mg Oral BID  . carvedilol  25 mg Oral BID  . Chlorhexidine  Gluconate Cloth  6 each Topical [id number]  . ferrous sulfate  325 mg Oral Daily  . hydrALAZINE  100 mg Oral TID  . lidocaine  2 patch Transdermal Q24H  . pantoprazole  40 mg Oral Daily  . scopolamine  1 patch Transdermal Q72H  . senna-docusate  2 tablet Oral QHS  . sodium chloride flush  3 mL Intravenous Q12H  . temazepam  15 mg Oral QHS  . umeclidinium bromide  1 puff Inhalation Daily

## 2020-02-12 NOTE — Progress Notes (Signed)
Patient refused vital signs and blood glucose checks and evening medications. Education given but patient still refused. Will continue to monitor.   Navdeep Halt,RN.

## 2020-02-12 NOTE — Progress Notes (Signed)
[name]: [phone]  Patient name: NACHUM DEROSSETT Medical record number: [id number] Date of birth: [birth year] Age: 54 y.o. Gender: male  Primary Care Provider: Patient, [name] Consultants: Nephro Code Status: FULL Preferred Emergency Contact: Seth Bake, sister, [phone]  Pt Overview and Major Events to Date:  Admitted from [facility] 02/09/2020- needed HD for dialysis  Assessment and Plan: CORNELUIS ALLSTON is a 54 y.o. male presented to OSH w/ left sided chest pain and SOB. He was subsequently transferred to [facility] for HD. PMH is significant for HTN, chronic pancreatitis, combined systolic and diastolic HF, type 2 DM, ESRD (on HD), large pericardial effusion.  Atypical chest pain likely 2/2 HF exacerbation - resolved No complaints of chest pain this morning. Does not appear to be short of breath. No crackles appreciated. Prolonged expiratory wheezing. 1+ LE pitting edema. 96% on 2L [place]. Will go for HD today.  - f/u repeat EKG - nephro consulted, appreciate recommendations - HD  Suspected acute on chronic HFmEF- stable Repeat echo was unchanged from the 01/28/2020 echo with an improved RA pressure. Patient continues to have large pericardial effusion that does not suggest tamponade.  - Monitor daily weights; no wt for today - strict I/Os - HD  ESRD on HD MWF Did not receive HD yesterday. Nephro was consulted concerning patient needed routine Dilaudid for each HD treatment for abdominal pain. There is possible Mesenteric ischemia with HD but nothing to support this currently. CTA abdomen is recommended in the future. - HD today  HTN BP 153/85. Home meds include amlodopine, hydralazine, carvedilol,  -Continue home medication  Chronic pancreatitis pain- stable Patient has home med oxycodone.  - 15 mg oxycodone q6h scheduled -  Continue Senna docusate  COPD On 2L [place]. No increased work of breathing. -Incruse ellipta -  Duonebs PRN  FEN/GI: Renal carb modified PPx: Eliquis  Disposition: Home pending HD today  Subjective:  Upset about not being able to get dialysis yesterday. Waited until 12am to get HD. Also upset about not being able to get dilaudid with his sessions. Amendable to going home today.   Objective: Temp:  [98.8 F (37.1 C)] 98.8 F (37.1 C) (07/23 1618) Pulse Rate:  [62] 62 (07/23 1618) Resp:  [18] 18 (07/23 1618) BP: (119)/(105) 119/105 (07/23 1618) SpO2:  [96 %] 96 % (07/23 1618)   Physical Exam: General: Appears upset, no acute distress. Age appropriate. Sitting on the side of the bed Cardiac: RRR, distant heart sounds, no murmurs Respiratory: Prolonged expiratory wheezing, normal effort Abdomen: taut, nontender, mildly distended Extremities: 1+BL pitting edema Skin: Warm and dry, scaly skin appearance  Neuro: alert and oriented x4  Laboratory: Recent Labs  Lab 02/09/20 0453 02/10/20 0815  WBC 5.0 4.6  HGB 9.0* 8.9*  HCT 28.6* 29.6*  PLT 138* 135*   Recent Labs  Lab 02/09/20 0453 02/10/20 0815  NA 140 139  K 4.9 5.5*  CL 99 100  CO2 25 23  BUN 91* 101*  CREATININE 12.08* 14.38*  CALCIUM 8.8* 7.9*  GLUCOSE 80 72   Imaging/Diagnostic Tests: No new imaging  Gerlene Fee, DO 02/12/2020, 7:21 AM PGY-2, [place] Intern pager: [phone], text pages welcome

## 2020-02-12 NOTE — Progress Notes (Signed)
Per HD nurse Obas, patient is not on the schedule for dialysis today. Patient informed about this. Will continue to monitor.   Lataysha Vohra,RN.

## 2020-02-13 NOTE — Hospital Course (Addendum)
[name] is a 54 yo male who presented to [facility] 02/09/2020 with atypical chest pain and SOB in the setting of missed HD. Patient was transferred to [facility] due to HD need. Patient has a PMH of hypertension, acute on chronic pancreatitis, pulmonary edema, CKD stage V, combined systolic and diastolic CHF, type 2 diabetes.   Atypical chest pain likely 2/2 HF exacerbation Patient presented anxious concerned for chest pain. Chest pain was along the lateral part of the left side of the chest and worse with palpation along the area, likely due to increased fluid. Patient also reported nausea. EKG similar to baseline, previous EKGs. NSR with borderline axis deviation, poor R wave progression and prolonged QTc w/o STEMI. Trp were below baseline. Patient received Toradol x1 and Zofran x 1 in the ED. Chest pain was alleviated. Patient received 2 HD sessions and pain decreased in frequency and patient was no longer nauseous.  Suspected acute on chronic HFmEF Patient missed HD appt. Prior to presentation. Was SOB at presentation, but had no new cough. CXR was significant for unchanged cardiomegaly, small right pleural effusion, and bilateral interstitial opacities likely reflecting edema.  Patient sat 96% 2L [place]. Received 2 HD sessions and restarted MWF schedule and breathing continued to improve. Repeat echo was preformed and was unchanged from 01/28/2020 echo reading.   ESRD w HD MWF Patient received 2 HD sessions. He requested Dilaudid prior to each session due to chronic abdominal pain that worsens during HD. He reported a history of stopping sessions early due to the pain. There is concern that patient may have mesenteric ischemia as patient is at risk for this. It is recommend that patient receive a CTA of the abdomen further investigating this in the future. Patient reported that his home medication of Oxy was not helpful for this pain. Recommend f/u with clinic about this.  HTN Home meds were held  at presentation due to hypotension. All were restarted after HD as patient became hypertensive.   COPD Lung sounded good on exam throughout hospitalization no increased work of breathing was noted. O2 sat was 96% 2L [place]. Continued on Ellipta.  Anxiety Patient has a hx of frequent hospitalization and reported that he is often anxious inducing the frequent ED visits and hospital stays. Patient believes he would benefit from behavioral health. SW attempted to schedule patient, but was unable to prior to discharge. SW advised patient how to schedule his own appt.

## 2020-02-13 NOTE — Discharge Summary (Addendum)
[facility] Discharge Summary  Patient name: Frank [name] Medical record number: [id number] Date of birth: Feb 05, 1966 Age: 54 y.o. Gender: male Date of Admission: 02/09/2020  Date of Discharge: 02/12/2020 Admitting Physician: Harold Hedge, MD  Primary Care Provider: Patient, [name] Consultants: Nephro  Indication for Hospitalization: Atypical chest pain  Discharge Diagnoses/Problem List:  Atypical chest pain likely 2/2 HF exacerbation - resolved Suspected acute on chronic HFmEF- stable ESRD on HD MWF- stable HTN- chronic stable COPD- chronic, stable Chronic pancreatitis pain- stable  Disposition: Home  Discharge Condition: Stable  Discharge Exam:  General: Appears upset, no acute distress. Age appropriate. Sitting on the side of the bed Cardiac: RRR, distant heart sounds, no murmurs Respiratory: Prolonged expiratory wheezing, normal effort Abdomen: taut, nontender, mildly distended Extremities: 1+BL pitting edema Skin: Warm and dry, scaly skin appearance  Neuro: alert and oriented x4  Brief Hospital Course:  Frank [name] is a 54 yo male who presented to [place] [facility] 02/09/2020 with atypical chest pain and SOB in the setting of missed HD. Patient was transferred to [name] due to HD need. Patient has a PMH of hypertension, acute on chronic pancreatitis, pulmonary edema, CKD stage V, combined systolic and diastolic CHF, type 2 diabetes.   Atypical chest pain likely 2/2 HF exacerbation Patient presented anxious concerned for chest pain. Chest pain was along the lateral part of the left side of the chest and worse with palpation along the area, likely due to increased fluid. Patient also reported nausea. EKG similar to baseline, previous EKGs. NSR with borderline axis deviation, poor R wave progression and prolonged QTc w/o STEMI. Trp were below baseline. Patient received Toradol x1 and Zofran x 1 in the ED. Chest pain was alleviated. Patient  received 2 HD sessions and pain decreased in frequency and patient was no longer nauseous.  Suspected acute on chronic HFmEF Patient missed HD appt. Prior to presentation. Was SOB at presentation, but had no new cough. CXR was significant for unchanged cardiomegaly, small right pleural effusion, and bilateral interstitial opacities likely reflecting edema.  Patient sat 96% 2L [place]. Received 2 HD sessions and restarted MWF schedule and breathing continued to improve. Repeat echo was preformed and was unchanged from 01/28/2020 echo reading.   ESRD w HD MWF Patient received 2 HD sessions. He requested Dilaudid prior to each session due to chronic abdominal pain that worsens during HD. He reported a history of stopping sessions early due to the pain. There is concern that patient may have mesenteric ischemia as patient is at risk for this. It is recommend that patient receive a CTA of the abdomen further investigating this in the future. Patient reported that his home medication of Oxy was not helpful for this pain. Recommend f/u with clinic about this.  HTN Home meds were held at presentation due to hypotension. All were restarted after HD as patient became hypertensive.   COPD Lung sounded good on exam throughout hospitalization no increased work of breathing was noted. O2 sat was 96% 2L [place]. Continued on Ellipta.  Anxiety Patient has a hx of frequent hospitalization and reported that he is often anxious inducing the frequent ED visits and hospital stays. Patient believes he would benefit from behavioral health. SW attempted to schedule patient, but was unable to prior to discharge. SW advised patient how to schedule his own appt.    Issues for Follow Up:  1. F/u with cardiology to address chronic mixed systolic diastolic heart failure  and chronic pleural effusion 2. Consider CTA to evaluate for mesenteric ischemia.  3. Needs sleep study to evaluate need for CPAP. 4. Make appt with [facility] clinic  Significant Procedures: None  Significant Labs and Imaging:  Recent Labs  Lab 02/09/20 0453 02/10/20 0815 02/12/20 1223  WBC 5.0 4.6 3.7*  HGB 9.0* 8.9* 8.7*  HCT 28.6* 29.6* 27.9*  PLT 138* 135* 129*   Recent Labs  Lab 02/09/20 0453 02/09/20 0453 02/10/20 0815 02/12/20 1223  NA 140  --  139 138  K 4.9   < > 5.5* 4.6  CL 99  --  100 99  CO2 25  --  23 23  GLUCOSE 80  --  72 86  BUN 91*  --  101* 65*  CREATININE 12.08*  --  14.38* 11.05*  CALCIUM 8.8*  --  7.9* 8.3*  PHOS  --   --   --  6.4*  ALBUMIN  --   --   --  3.1*   < > = values in this interval not displayed.   ECHOCARDIOGRAM LIMITED  Result Date: 02/10/2020    ECHOCARDIOGRAM LIMITED REPORT   Patient Name:   Frank [name] Date of Exam: 02/10/2020 Medical Rec #:  [id number]    Height:       74.0 in Accession #:    [id number]   Weight:       191.8 lb Date of Birth:  03/05/1966     BSA:          2.135 m Patient Age:    30 years     BP:           196/74 mmHg Patient Gender: M            HR:           79 bpm. Exam Location:  Inpatient Procedure: Limited Echo, Cardiac Doppler and Color Doppler Indications:    CHF  History:        Patient has prior history of Echocardiogram examinations, most                 recent 01/28/2020. CHF, Pericardial Disease, Signs/Symptoms:ESRD;                 Risk Factors:Hypertension.  Sonographer:    Dustin Flock Referring Phys: [id number] [place]  1. Left ventricular ejection fraction, by estimation, is 40 to 45%. The left ventricle has mildly decreased function. The left ventricle demonstrates regional wall motion abnormalities. Septal/inferior hypokinesis. There is severe left ventricular hypertrophy.  2. Right ventricular systolic function is moderately reduced. The right ventricular size is moderately enlarged.  3. The inferior vena cava is normal in size with <50% respiratory variability, suggesting right atrial pressure of 8 mmHg.  4. Large pericardial  effusion, measures up to 2.3 cm adjacent to LV lateral wall. IVC is collapsible and no RA/RV collapse seen to suggest tamponade. FINDINGS  Left Ventricle: Left ventricular ejection fraction, by estimation, is 40 to 45%. The left ventricle has mildly decreased function. The left ventricle demonstrates regional wall motion abnormalities. The left ventricular internal cavity size was normal in size. There is severe left ventricular hypertrophy. Right Ventricle: The right ventricular size is moderately enlarged. Right vetricular wall thickness was not assessed. Right ventricular systolic function is moderately reduced. Pericardium: Large effusion, measures up to 2.3 cm adjacent to LV lateral wall. No RA/RV collapse seen to suggest tamponade. A large pericardial effusion is present.  Venous: The inferior vena cava is normal in size with less than 50% respiratory variability, suggesting right atrial pressure of 8 mmHg. LEFT VENTRICLE PLAX 2D LVIDd:         4.60 cm Diastology LVIDs:         3.40 cm LV e' lateral:   12.60 cm/s LV PW:         1.50 cm LV E/e' lateral: 2.8 LV IVS:        1.50 cm LV e' medial:    8.05 cm/s                        LV E/e' medial:  4.4  MITRAL VALVE MV Area (PHT): 4.31 cm MV Decel Time: 176 msec MV E velocity: 35.58 cm/s MV A velocity: 59.10 cm/s MV E/A ratio:  0.60 Oswaldo Milian MD Electronically signed by Oswaldo Milian MD Signature Date/Time: 02/10/2020/9:01:45 PM    Final    02/09/2020- CXR Unchanged cardiomegaly, small right pleural effusion, and bilateral interstitial opacities likely reflecting edema.  Results/Tests Pending at Time of Discharge: None  Discharge Medications:  Allergies as of 02/12/2020      Reactions   Butalbital Anaphylaxis, Other (See Comments)   Butalbital-apap-caffeine Shortness Of Breath, Swelling, Other (See Comments)   Swelling in throat   Minoxidil Shortness Of Breath, Other (See Comments)   Na Ferric Gluc Cplx In Sucrose Shortness Of Breath,  Swelling, Other (See Comments)   Swelling in throat, tolerates Venofor   Tylenol [acetaminophen] Anaphylaxis, Swelling   Darvocet [propoxyphene N-acetaminophen] Hives   Other Hives, Other (See Comments)   Allergy listed on file from [facility] (Pt did not mention any allergies) Pt able to do norco (tylenol)      Medication List    STOP taking these medications   ondansetron 4 MG tablet Commonly known as: ZOFRAN     TAKE these medications   albuterol (2.5 MG/3ML) 0.083% nebulizer solution Commonly known as: PROVENTIL Inhale 3 mLs (2.5 mg total) into the lungs every 2 (two) hours as needed for wheezing or shortness of breath.   amLODipine 10 MG tablet Commonly known as: NORVASC Take 10 mg by mouth daily. What changed: Another medication with the same name was removed. Continue taking this medication, and follow the directions you see here.   carvedilol 25 MG tablet Commonly known as: COREG Take 25 mg by mouth 2 (two) times daily.   cyclobenzaprine 10 MG tablet Commonly known as: FLEXERIL Take 10 mg by mouth in the morning, at noon, and at bedtime.   diphenhydrAMINE 25 mg capsule Commonly known as: BENADRYL Take 25 mg by mouth every 8 (eight) hours as needed for itching.   ELIQUIS PO Take 1 tablet by mouth 2 (two) times daily.   ferrous sulfate 325 (65 FE) MG tablet Take 325 mg by mouth daily.   hydrALAZINE 100 MG tablet Commonly known as: APRESOLINE Take 1 tablet (100 mg total) by mouth 3 (three) times daily. What changed: when to take this   Incruse Ellipta 62.5 MCG/INH Aepb Generic drug: umeclidinium bromide Inhale 1 puff into the lungs daily.   lanthanum 1000 MG chewable tablet Commonly known as: FOSRENOL Chew 1 tablet (1,000 mg total) by mouth 3 (three) times daily with meals.   Linzess 72 MCG capsule Generic drug: linaclotide Take 72 mcg by mouth in the morning and at bedtime.   lisinopril 5 MG tablet Commonly known as: ZESTRIL Take 5 mg by mouth  daily.  naloxone 4 MG/0.1ML Liqd nasal spray kit Commonly known as: NARCAN Place 1 spray into the nose once as needed (opioid reversal).   Nephro Vitamins 0.8 MG Tabs Take 1 tablet by mouth daily.   nitroGLYCERIN 0.4 MG SL tablet Commonly known as: NITROSTAT Place 1 tablet (0.4 mg total) under the tongue every 5 (five) minutes as needed for chest pain.   omeprazole 20 MG capsule Commonly known as: PRILOSEC Take 20 mg by mouth daily.   Orafate 10 % Pste 10 mLs by Transmucosal route 3 (three) times daily with meals.   oxyCODONE 15 MG immediate release tablet Commonly known as: ROXICODONE Take 15 mg by mouth See admin instructions. Take 15 mg by mouth six times daily as needed for pain   prochlorperazine 10 MG tablet Commonly known as: COMPAZINE Take 1 tablet (10 mg total) by mouth 2 (two) times daily as needed for nausea or vomiting.   scopolamine 1 MG/3DAYS Commonly known as: TRANSDERM-SCOP Place 1 patch onto the skin every 3 (three) days.   senna-docusate 8.6-50 MG tablet Commonly known as: Senokot-S Take 2 tablets by mouth at bedtime.   temazepam 15 MG capsule Commonly known as: RESTORIL Take 15 mg by mouth at bedtime.       Discharge Instructions: Please refer to Patient Instructions section of EMR for full details.  Patient was counseled important signs and symptoms that should prompt return to medical care, changes in medications, dietary instructions, activity restrictions, and follow up appointments.   Follow-Up Appointments:  Follow-up [place]. Schedule an appointment as soon as possible for a visit.   Contact information: [phone]  Emergency crisis [phone]       Lorretta Harp, MD. Schedule an appointment as soon as possible for a visit in 2 day(s).   Specialties: Cardiology, Radiology Why: Hospital follow up Contact information: [address] [place]  [address] [phone]               Freida Busman, MD 02/13/2020, 11:24 AM PGY-1, [place] Autry-Lott, DO 02/16/2020, 8:43 AM PGY-2, [place]
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
## 2020-02-15 ENCOUNTER — Telehealth (INDEPENDENT_AMBULATORY_CARE_PROVIDER_SITE_OTHER): Payer: Medicare Other | Admitting: General Practice

## 2020-02-15 ENCOUNTER — Encounter: Payer: Self-pay | Admitting: General Practice

## 2020-02-15 VITALS — Ht 74.0 in

## 2020-02-15 DIAGNOSIS — I428 Other cardiomyopathies: Secondary | ICD-10-CM

## 2020-02-15 NOTE — Patient Instructions (Signed)
Medication Instructions:  Your physician recommends that you continue on your current medications as directed. Please refer to the Current Medication list given to you today.  *If you need a refill on your cardiac medications before your next appointment, please call your pharmacy*    Testing/Procedures: Your physician has requested that you have an echocardiogram in 3 months (October). Echocardiography is a painless test that uses sound waves to create images of your heart. It provides your doctor with information about the size and shape of your heart and how well your heart's chambers and valves are working. This procedure takes approximately one hour. There are no restrictions for this procedure. Our office will contact you to schedule this.    Follow-Up: At [facility], you and your health needs are our priority.  As part of our continuing mission to provide you with exceptional heart care, we have created designated Provider Care Teams.  These Care Teams include your primary Cardiologist (physician) and Advanced Practice Providers (APPs -  Physician Assistants and Nurse Practitioners) who all work together to provide you with the care you need, when you need it.  We recommend signing up for the patient portal called "MyChart".  Sign up information is provided on this After Visit Summary.  MyChart is used to connect with patients for Virtual Visits (Telemedicine).  Patients are able to view lab/test results, encounter notes, upcoming appointments, etc.  Non-urgent messages can be sent to your provider as well.   To learn more about what you can do with MyChart, go to [email or web address].    Your next appointment:   3 month(s)  The format for your next appointment:   In Person  Provider:   Quay Burow, MD   Other Instructions Our office will also call you to schedule your follow up appointment with Dr. Gwenlyn Found.  Please review the Salty Six sheet provided about a low sodium  diet.

## 2020-02-17 ENCOUNTER — Ambulatory Visit: Payer: Medicare Other | Attending: Physician Assistant | Admitting: Physician Assistant

## 2020-02-17 ENCOUNTER — Other Ambulatory Visit (HOSPITAL_COMMUNITY): Payer: Medicare Other

## 2020-02-17 ENCOUNTER — Other Ambulatory Visit: Payer: Self-pay

## 2020-02-18 ENCOUNTER — Emergency Department (HOSPITAL_BASED_OUTPATIENT_CLINIC_OR_DEPARTMENT_OTHER): Payer: Medicare Other

## 2020-02-18 ENCOUNTER — Emergency Department (HOSPITAL_BASED_OUTPATIENT_CLINIC_OR_DEPARTMENT_OTHER)
Admission: EM | Admit: 2020-02-18 | Discharge: 2020-02-18 | Disposition: A | Discharge status: Home / Self Care | Payer: Medicare Other | Source: Home / Self Care | Attending: Emergency Medicine | Admitting: Emergency Medicine

## 2020-02-18 ENCOUNTER — Emergency Department (HOSPITAL_COMMUNITY): Payer: Medicare Other

## 2020-02-18 ENCOUNTER — Encounter (HOSPITAL_COMMUNITY): Payer: Self-pay

## 2020-02-18 ENCOUNTER — Other Ambulatory Visit: Payer: Self-pay

## 2020-02-18 ENCOUNTER — Emergency Department (HOSPITAL_COMMUNITY)
Admission: EM | Admit: 2020-02-18 | Discharge: 2020-02-18 | Disposition: A | Discharge status: Home / Self Care | Payer: Medicare Other | Attending: Emergency Medicine | Admitting: Emergency Medicine

## 2020-02-18 DIAGNOSIS — Z992 Dependence on renal dialysis: Secondary | ICD-10-CM | POA: Insufficient documentation

## 2020-02-18 DIAGNOSIS — I130 Hypertensive heart and chronic kidney disease with heart failure and stage 1 through stage 4 chronic kidney disease, or unspecified chronic kidney disease: Secondary | ICD-10-CM | POA: Insufficient documentation

## 2020-02-18 DIAGNOSIS — E1122 Type 2 diabetes mellitus with diabetic chronic kidney disease: Secondary | ICD-10-CM | POA: Insufficient documentation

## 2020-02-18 DIAGNOSIS — I5042 Chronic combined systolic (congestive) and diastolic (congestive) heart failure: Secondary | ICD-10-CM | POA: Insufficient documentation

## 2020-02-18 DIAGNOSIS — R1110 Vomiting, unspecified: Secondary | ICD-10-CM | POA: Diagnosis not present

## 2020-02-18 DIAGNOSIS — R079 Chest pain, unspecified: Secondary | ICD-10-CM

## 2020-02-18 DIAGNOSIS — R0602 Shortness of breath: Secondary | ICD-10-CM | POA: Insufficient documentation

## 2020-02-18 DIAGNOSIS — Z79899 Other long term (current) drug therapy: Secondary | ICD-10-CM | POA: Insufficient documentation

## 2020-02-18 DIAGNOSIS — R609 Edema, unspecified: Secondary | ICD-10-CM

## 2020-02-18 DIAGNOSIS — J449 Chronic obstructive pulmonary disease, unspecified: Secondary | ICD-10-CM | POA: Insufficient documentation

## 2020-02-18 DIAGNOSIS — Z7951 Long term (current) use of inhaled steroids: Secondary | ICD-10-CM | POA: Insufficient documentation

## 2020-02-18 DIAGNOSIS — N189 Chronic kidney disease, unspecified: Secondary | ICD-10-CM | POA: Insufficient documentation

## 2020-02-18 DIAGNOSIS — R05 Cough: Secondary | ICD-10-CM | POA: Insufficient documentation

## 2020-02-18 DIAGNOSIS — J189 Pneumonia, unspecified organism: Secondary | ICD-10-CM | POA: Insufficient documentation

## 2020-02-18 DIAGNOSIS — J90 Pleural effusion, not elsewhere classified: Secondary | ICD-10-CM

## 2020-02-18 DIAGNOSIS — R059 Cough, unspecified: Secondary | ICD-10-CM

## 2020-02-18 DIAGNOSIS — Z5321 Procedure and treatment not carried out due to patient leaving prior to being seen by health care provider: Secondary | ICD-10-CM | POA: Diagnosis not present

## 2020-02-18 DIAGNOSIS — Z87891 Personal history of nicotine dependence: Secondary | ICD-10-CM | POA: Insufficient documentation

## 2020-02-18 LAB — COMPREHENSIVE METABOLIC PANEL
ALT: 14 U/L (ref 0–44)
AST: 19 U/L (ref 15–41)
Albumin: 3.4 g/dL — ABNORMAL LOW (ref 3.5–5.0)
Alkaline Phosphatase: 219 U/L — ABNORMAL HIGH (ref 38–126)
Anion gap: 17 — ABNORMAL HIGH (ref 5–15)
BUN: 67 mg/dL — ABNORMAL HIGH (ref 6–20)
CO2: 26 mmol/L (ref 22–32)
Calcium: 8.4 mg/dL — ABNORMAL LOW (ref 8.9–10.3)
Chloride: 96 mmol/L — ABNORMAL LOW (ref 98–111)
Creatinine, Ser: 11.31 mg/dL — ABNORMAL HIGH (ref 0.61–1.24)
GFR calc Af Amer: 5 mL/min — ABNORMAL LOW (ref >60)
GFR calc non Af Amer: 4 mL/min — ABNORMAL LOW (ref >60)
Glucose, Bld: 74 mg/dL (ref 70–99)
Potassium: 5.1 mmol/L (ref 3.5–5.1)
Sodium: 139 mmol/L (ref 135–145)
Total Bilirubin: 0.9 mg/dL (ref 0.3–1.2)
Total Protein: 7.9 g/dL (ref 6.5–8.1)

## 2020-02-18 LAB — CBC WITH DIFFERENTIAL/PLATELET
Abs Immature Granulocytes: 0.02 10*3/uL (ref 0.00–0.07)
Basophils Absolute: 0 10*3/uL (ref 0.0–0.1)
Basophils Relative: 0 %
Eosinophils Absolute: 0.2 10*3/uL (ref 0.0–0.5)
Eosinophils Relative: 7 %
HCT: 29.6 % — ABNORMAL LOW (ref 39.0–52.0)
Hemoglobin: 9.2 g/dL — ABNORMAL LOW (ref 13.0–17.0)
Immature Granulocytes: 1 %
Lymphocytes Relative: 12 %
Lymphs Abs: 0.4 10*3/uL — ABNORMAL LOW (ref 0.7–4.0)
MCH: 26.5 pg (ref 26.0–34.0)
MCHC: 31.1 g/dL (ref 30.0–36.0)
MCV: 85.3 fL (ref 80.0–100.0)
Monocytes Absolute: 0.4 10*3/uL (ref 0.1–1.0)
Monocytes Relative: 12 %
Neutro Abs: 2.2 10*3/uL (ref 1.7–7.7)
Neutrophils Relative %: 68 %
Platelets: 168 10*3/uL (ref 150–400)
RBC: 3.47 MIL/uL — ABNORMAL LOW (ref 4.22–5.81)
RDW: 18.6 % — ABNORMAL HIGH (ref 11.5–15.5)
WBC: 3.2 10*3/uL — ABNORMAL LOW (ref 4.0–10.5)
nRBC: 0 % (ref 0.0–0.2)

## 2020-02-18 LAB — CBG MONITORING, ED: Glucose-Capillary: 66 mg/dL — ABNORMAL LOW (ref 70–99)

## 2020-02-18 LAB — TROPONIN I (HIGH SENSITIVITY): Troponin I (High Sensitivity): 29 ng/L — ABNORMAL HIGH (ref <18)

## 2020-02-18 LAB — LIPASE, BLOOD: Lipase: 48 U/L (ref 11–51)

## 2020-02-18 LAB — LACTIC ACID, PLASMA: Lactic Acid, Venous: 0.6 mmol/L (ref 0.5–1.9)

## 2020-02-18 MED ORDER — DOXYCYCLINE HYCLATE 100 MG PO CAPS
100.0000 mg | ORAL_CAPSULE | Freq: Two times a day (BID) | ORAL | 0 refills | Status: AC
Start: 2020-02-18 — End: 2020-02-25

## 2020-02-18 MED ORDER — PROCHLORPERAZINE EDISYLATE 10 MG/2ML IJ SOLN
10.0000 mg | Freq: Once | INTRAMUSCULAR | Status: AC
  Administered 2020-02-18: 10 mg via INTRAVENOUS
  Filled 2020-02-18: qty 2

## 2020-02-18 MED ORDER — HYDROMORPHONE HCL 1 MG/ML IJ SOLN
1.0000 mg | Freq: Once | INTRAMUSCULAR | Status: AC
  Administered 2020-02-18: 1 mg via INTRAVENOUS
  Filled 2020-02-18: qty 1

## 2020-02-18 NOTE — ED Notes (Signed)
ED Provider at bedside. 
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
## 2020-02-18 NOTE — ED Triage Notes (Signed)
Pt. A&O x 4 ambulatory. C/o chest pain that is sustaining. Pain increases when pt vomits. Pt. Stayed 2 hours at dialysis today.

## 2020-02-18 NOTE — ED Notes (Signed)
Pt did not wish to wait for discharge paperwork, left department.  Dr Tegeler aware. Had discussed instructions prior to his leaving.  IV site removed, without active bleeding, dressing intact.

## 2020-02-18 NOTE — ED Notes (Signed)
Pt with elevated bp question of accuracy due to taking measurement in leg due to extremity restrictions.  Provider made aware.

## 2020-02-18 NOTE — Discharge Instructions (Signed)
Your work-up today did show evidence of pneumonia however based on your oxygen saturations being normal on your home oxygen, your lack of hyperkalemia, your normal ambulation, and your improving appearance, we do not feel you need emergent dialysis or admission at this time.  Please pick up the prescription for antibiotics to treat the outpatient pneumonia.  You report that you would like to go try to get dialysis today by going to the dialysis center, please go if you would like.  Please rest and stay hydrated at home.  Please follow-up with your primary doctor.  If any symptoms change or worsen, please return to the nearest emergency department.  Please monitor your blood pressure as it was labile during ED stay but had improved before you discharged.

## 2020-02-18 NOTE — ED Notes (Signed)
Calling out for help in lobby. RN assessed patient, no change in condition at this time. Has removed oxygen that he requested. Reassured that treatment room would be available soon.

## 2020-02-18 NOTE — ED Notes (Signed)
Pt is given several blankets and a pillow while out in the lobby waiting for a room

## 2020-02-18 NOTE — ED Notes (Signed)
Pt awaiting MD to attempt U/S guided IV, declines cardiac monitoring until then.

## 2020-02-18 NOTE — ED Notes (Signed)
Pt requests to leave to be able to keep dialysis appointment.  No active chest pain at this time.  MD to discharge

## 2020-02-18 NOTE — ED Triage Notes (Signed)
Pt. Refused EKG and labs.

## 2020-02-18 NOTE — ED Triage Notes (Signed)
Pt reports chest pain, emesis. Left [facility] d/t wait time.

## 2020-02-18 NOTE — ED Notes (Signed)
[place] IV attempted for blood work by [employer] for blood work.

## 2020-02-18 NOTE — ED Notes (Signed)
Pt placed on 2lpm by [place] oxygen due to pt stating he is SOB and he is on 2lpm by  at home

## 2020-02-18 NOTE — ED Notes (Signed)
Pt awaiting re-eval by MD for dispo

## 2020-02-18 NOTE — ED Notes (Signed)
Pt.s CBG checked as requested per [facility]. Pt.s CBG is 39, Maggie RN notified.

## 2020-02-18 NOTE — ED Notes (Signed)
Pt ambulated to bathroom with steady gait. 

## 2020-02-19 ENCOUNTER — Other Ambulatory Visit: Payer: Self-pay

## 2020-02-19 ENCOUNTER — Emergency Department (HOSPITAL_COMMUNITY)
Admission: EM | Admit: 2020-02-19 | Discharge: 2020-02-19 | Disposition: A | Discharge status: Home / Self Care | Payer: Medicare Other | Attending: Emergency Medicine | Admitting: Emergency Medicine

## 2020-02-19 DIAGNOSIS — Z5321 Procedure and treatment not carried out due to patient leaving prior to being seen by health care provider: Secondary | ICD-10-CM | POA: Diagnosis not present

## 2020-02-19 DIAGNOSIS — R0602 Shortness of breath: Secondary | ICD-10-CM | POA: Diagnosis not present

## 2020-02-19 DIAGNOSIS — R400 Somnolence: Secondary | ICD-10-CM | POA: Insufficient documentation

## 2020-02-19 LAB — CBG MONITORING, ED: Glucose-Capillary: 90 mg/dL (ref 70–99)

## 2020-02-19 NOTE — Progress Notes (Signed)
CSW spoke with patient regarding request for transportation assistance to [facility] dialysis and signed the [facility] transport consent form. CSW coordinated ride with [facility] for order.

## 2020-02-19 NOTE — ED Triage Notes (Signed)
BIB EMS reporting SOB and feeling "sleepy" Refusing labs and chest xray.

## 2020-02-19 NOTE — ED Notes (Signed)
Pt refused vitals signs check

## 2020-02-22 ENCOUNTER — Emergency Department (HOSPITAL_COMMUNITY)
Admission: EM | Admit: 2020-02-22 | Discharge: 2020-02-22 | Disposition: A | Discharge status: Home / Self Care | Payer: Medicare Other | Attending: Emergency Medicine | Admitting: Emergency Medicine

## 2020-02-22 ENCOUNTER — Emergency Department (HOSPITAL_COMMUNITY): Payer: Medicare Other

## 2020-02-22 ENCOUNTER — Emergency Department (HOSPITAL_BASED_OUTPATIENT_CLINIC_OR_DEPARTMENT_OTHER)
Admission: EM | Admit: 2020-02-22 | Discharge: 2020-02-23 | Disposition: A | Discharge status: Home / Self Care | Payer: Medicare Other | Source: Home / Self Care | Attending: Emergency Medicine | Admitting: Emergency Medicine

## 2020-02-22 ENCOUNTER — Other Ambulatory Visit: Payer: Self-pay

## 2020-02-22 ENCOUNTER — Encounter (HOSPITAL_BASED_OUTPATIENT_CLINIC_OR_DEPARTMENT_OTHER): Payer: Self-pay

## 2020-02-22 ENCOUNTER — Encounter (HOSPITAL_COMMUNITY): Payer: Self-pay

## 2020-02-22 DIAGNOSIS — Z87891 Personal history of nicotine dependence: Secondary | ICD-10-CM | POA: Insufficient documentation

## 2020-02-22 DIAGNOSIS — F1190 Opioid use, unspecified, uncomplicated: Secondary | ICD-10-CM

## 2020-02-22 DIAGNOSIS — M62830 Muscle spasm of back: Secondary | ICD-10-CM | POA: Insufficient documentation

## 2020-02-22 DIAGNOSIS — Z86718 Personal history of other venous thrombosis and embolism: Secondary | ICD-10-CM | POA: Insufficient documentation

## 2020-02-22 DIAGNOSIS — G8929 Other chronic pain: Secondary | ICD-10-CM

## 2020-02-22 DIAGNOSIS — E1122 Type 2 diabetes mellitus with diabetic chronic kidney disease: Secondary | ICD-10-CM | POA: Insufficient documentation

## 2020-02-22 DIAGNOSIS — I5042 Chronic combined systolic (congestive) and diastolic (congestive) heart failure: Secondary | ICD-10-CM | POA: Diagnosis not present

## 2020-02-22 DIAGNOSIS — Z992 Dependence on renal dialysis: Secondary | ICD-10-CM | POA: Insufficient documentation

## 2020-02-22 DIAGNOSIS — M25511 Pain in right shoulder: Secondary | ICD-10-CM | POA: Diagnosis not present

## 2020-02-22 DIAGNOSIS — R109 Unspecified abdominal pain: Secondary | ICD-10-CM | POA: Insufficient documentation

## 2020-02-22 DIAGNOSIS — R112 Nausea with vomiting, unspecified: Secondary | ICD-10-CM | POA: Insufficient documentation

## 2020-02-22 DIAGNOSIS — F1590 Other stimulant use, unspecified, uncomplicated: Secondary | ICD-10-CM | POA: Diagnosis not present

## 2020-02-22 DIAGNOSIS — I132 Hypertensive heart and chronic kidney disease with heart failure and with stage 5 chronic kidney disease, or end stage renal disease: Secondary | ICD-10-CM | POA: Insufficient documentation

## 2020-02-22 DIAGNOSIS — N186 End stage renal disease: Secondary | ICD-10-CM | POA: Insufficient documentation

## 2020-02-22 DIAGNOSIS — Z79899 Other long term (current) drug therapy: Secondary | ICD-10-CM | POA: Insufficient documentation

## 2020-02-22 DIAGNOSIS — R10817 Generalized abdominal tenderness: Secondary | ICD-10-CM | POA: Diagnosis not present

## 2020-02-22 DIAGNOSIS — E119 Type 2 diabetes mellitus without complications: Secondary | ICD-10-CM | POA: Insufficient documentation

## 2020-02-22 DIAGNOSIS — R110 Nausea: Secondary | ICD-10-CM

## 2020-02-22 DIAGNOSIS — I5022 Chronic systolic (congestive) heart failure: Secondary | ICD-10-CM | POA: Insufficient documentation

## 2020-02-22 LAB — CBC WITH DIFFERENTIAL/PLATELET
Abs Immature Granulocytes: 0.02 10*3/uL (ref 0.00–0.07)
Basophils Absolute: 0 10*3/uL (ref 0.0–0.1)
Basophils Relative: 1 %
Eosinophils Absolute: 0.2 10*3/uL (ref 0.0–0.5)
Eosinophils Relative: 4 %
HCT: 32.3 % — ABNORMAL LOW (ref 39.0–52.0)
Hemoglobin: 9.6 g/dL — ABNORMAL LOW (ref 13.0–17.0)
Immature Granulocytes: 1 %
Lymphocytes Relative: 9 %
Lymphs Abs: 0.4 10*3/uL — ABNORMAL LOW (ref 0.7–4.0)
MCH: 26.2 pg (ref 26.0–34.0)
MCHC: 29.7 g/dL — ABNORMAL LOW (ref 30.0–36.0)
MCV: 88 fL (ref 80.0–100.0)
Monocytes Absolute: 0.6 10*3/uL (ref 0.1–1.0)
Monocytes Relative: 14 %
Neutro Abs: 3 10*3/uL (ref 1.7–7.7)
Neutrophils Relative %: 71 %
Platelets: 162 10*3/uL (ref 150–400)
RBC: 3.67 MIL/uL — ABNORMAL LOW (ref 4.22–5.81)
RDW: 18.9 % — ABNORMAL HIGH (ref 11.5–15.5)
WBC: 4.1 10*3/uL (ref 4.0–10.5)
nRBC: 0 % (ref 0.0–0.2)

## 2020-02-22 LAB — COMPREHENSIVE METABOLIC PANEL
ALT: 14 U/L (ref 0–44)
AST: 21 U/L (ref 15–41)
Albumin: 3.5 g/dL (ref 3.5–5.0)
Alkaline Phosphatase: 224 U/L — ABNORMAL HIGH (ref 38–126)
Anion gap: 13 (ref 5–15)
BUN: 37 mg/dL — ABNORMAL HIGH (ref 6–20)
CO2: 30 mmol/L (ref 22–32)
Calcium: 8.8 mg/dL — ABNORMAL LOW (ref 8.9–10.3)
Chloride: 98 mmol/L (ref 98–111)
Creatinine, Ser: 7.43 mg/dL — ABNORMAL HIGH (ref 0.61–1.24)
GFR calc Af Amer: 9 mL/min — ABNORMAL LOW (ref >60)
GFR calc non Af Amer: 7 mL/min — ABNORMAL LOW (ref >60)
Glucose, Bld: 103 mg/dL — ABNORMAL HIGH (ref 70–99)
Potassium: 3.9 mmol/L (ref 3.5–5.1)
Sodium: 141 mmol/L (ref 135–145)
Total Bilirubin: 1.1 mg/dL (ref 0.3–1.2)
Total Protein: 7.5 g/dL (ref 6.5–8.1)

## 2020-02-22 LAB — CBG MONITORING, ED
Glucose-Capillary: 93 mg/dL (ref 70–99)
Glucose-Capillary: 93 mg/dL (ref 70–99)
Glucose-Capillary: 81 mg/dL (ref 70–99)

## 2020-02-22 LAB — LIPASE, BLOOD: Lipase: 35 U/L (ref 11–51)

## 2020-02-22 NOTE — ED Notes (Signed)
Per patient constant request, although oxygenating fine at 96% on room air Patient placed on 1.5 L [place]

## 2020-02-22 NOTE — ED Notes (Signed)
Patient told he has been discharged and that he is free to leave

## 2020-02-22 NOTE — ED Notes (Signed)
Patient refusing to sign discharge paperwork and states our doctors are crazy

## 2020-02-22 NOTE — ED Notes (Signed)
Patient states he felt nauseous after drinking and eating in the lobby.  Blood sugar is 93. Vital signs are stable Patient states he has dialysis today

## 2020-02-22 NOTE — ED Notes (Signed)
Patient will not allow retake of blood pressure and will only allow it on his leg
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
## 2020-02-22 NOTE — ED Triage Notes (Signed)
Arrived POV. Patient reports abdominal pain that radiates into right shoulder that started today after dialysis.

## 2020-02-22 NOTE — ED Triage Notes (Signed)
Pt c/o abd/back pain x today-seen at [facility] ED for same-states sx no better-states he did have dialysis after ED visit at [facility] ED-NAD-to triage in w/c

## 2020-02-22 NOTE — ED Notes (Signed)
Provider at bedside 

## 2020-02-23 MED ORDER — PROMETHAZINE HCL 25 MG/ML IJ SOLN
25.0000 mg | Freq: Once | INTRAMUSCULAR | Status: AC
  Administered 2020-02-23: 25 mg via INTRAVENOUS
  Filled 2020-02-23: qty 1

## 2020-02-23 MED ORDER — CYCLOBENZAPRINE HCL 10 MG PO TABS
10.0000 mg | ORAL_TABLET | Freq: Once | ORAL | Status: AC
  Administered 2020-02-23: 10 mg via ORAL
  Filled 2020-02-23: qty 1

## 2020-02-23 NOTE — ED Notes (Signed)
Assisted MD with exam. Pt informed by MD he would not be getting narcotic medication.
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
## 2020-02-26 ENCOUNTER — Emergency Department (HOSPITAL_COMMUNITY): Payer: Medicare Other

## 2020-02-26 ENCOUNTER — Emergency Department (HOSPITAL_COMMUNITY)
Admission: EM | Admit: 2020-02-26 | Discharge: 2020-02-27 | Disposition: A | Discharge status: Home / Self Care | Payer: Medicare Other | Attending: Emergency Medicine | Admitting: Emergency Medicine

## 2020-02-26 DIAGNOSIS — Z7901 Long term (current) use of anticoagulants: Secondary | ICD-10-CM | POA: Insufficient documentation

## 2020-02-26 DIAGNOSIS — Z87891 Personal history of nicotine dependence: Secondary | ICD-10-CM | POA: Diagnosis not present

## 2020-02-26 DIAGNOSIS — I5042 Chronic combined systolic (congestive) and diastolic (congestive) heart failure: Secondary | ICD-10-CM | POA: Insufficient documentation

## 2020-02-26 DIAGNOSIS — R0602 Shortness of breath: Secondary | ICD-10-CM | POA: Diagnosis not present

## 2020-02-26 DIAGNOSIS — E119 Type 2 diabetes mellitus without complications: Secondary | ICD-10-CM | POA: Diagnosis not present

## 2020-02-26 DIAGNOSIS — J441 Chronic obstructive pulmonary disease with (acute) exacerbation: Secondary | ICD-10-CM | POA: Insufficient documentation

## 2020-02-26 DIAGNOSIS — M79604 Pain in right leg: Secondary | ICD-10-CM | POA: Insufficient documentation

## 2020-02-26 DIAGNOSIS — Z79899 Other long term (current) drug therapy: Secondary | ICD-10-CM | POA: Insufficient documentation

## 2020-02-26 DIAGNOSIS — R0600 Dyspnea, unspecified: Secondary | ICD-10-CM | POA: Diagnosis not present

## 2020-02-26 DIAGNOSIS — N186 End stage renal disease: Secondary | ICD-10-CM | POA: Insufficient documentation

## 2020-02-26 DIAGNOSIS — Z992 Dependence on renal dialysis: Secondary | ICD-10-CM | POA: Insufficient documentation

## 2020-02-26 DIAGNOSIS — Z7951 Long term (current) use of inhaled steroids: Secondary | ICD-10-CM | POA: Insufficient documentation

## 2020-02-26 DIAGNOSIS — I132 Hypertensive heart and chronic kidney disease with heart failure and with stage 5 chronic kidney disease, or end stage renal disease: Secondary | ICD-10-CM | POA: Insufficient documentation
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
## 2020-02-27 ENCOUNTER — Other Ambulatory Visit: Payer: Self-pay

## 2020-02-27 ENCOUNTER — Emergency Department (HOSPITAL_COMMUNITY): Payer: Medicare Other

## 2020-02-27 ENCOUNTER — Encounter (HOSPITAL_COMMUNITY): Payer: Self-pay

## 2020-02-27 ENCOUNTER — Emergency Department (HOSPITAL_COMMUNITY)
Admission: EM | Admit: 2020-02-27 | Discharge: 2020-02-28 | Disposition: A | Discharge status: Home / Self Care | Payer: Medicare Other | Source: Home / Self Care | Attending: Emergency Medicine | Admitting: Emergency Medicine

## 2020-02-27 DIAGNOSIS — Z7951 Long term (current) use of inhaled steroids: Secondary | ICD-10-CM | POA: Insufficient documentation

## 2020-02-27 DIAGNOSIS — M79642 Pain in left hand: Secondary | ICD-10-CM

## 2020-02-27 DIAGNOSIS — Z86718 Personal history of other venous thrombosis and embolism: Secondary | ICD-10-CM | POA: Insufficient documentation

## 2020-02-27 DIAGNOSIS — R0789 Other chest pain: Secondary | ICD-10-CM

## 2020-02-27 DIAGNOSIS — Z87891 Personal history of nicotine dependence: Secondary | ICD-10-CM | POA: Insufficient documentation

## 2020-02-27 DIAGNOSIS — N186 End stage renal disease: Secondary | ICD-10-CM | POA: Insufficient documentation

## 2020-02-27 DIAGNOSIS — M25551 Pain in right hip: Secondary | ICD-10-CM

## 2020-02-27 DIAGNOSIS — Z79899 Other long term (current) drug therapy: Secondary | ICD-10-CM | POA: Insufficient documentation

## 2020-02-27 DIAGNOSIS — I5042 Chronic combined systolic (congestive) and diastolic (congestive) heart failure: Secondary | ICD-10-CM | POA: Insufficient documentation

## 2020-02-27 DIAGNOSIS — J449 Chronic obstructive pulmonary disease, unspecified: Secondary | ICD-10-CM | POA: Insufficient documentation

## 2020-02-27 DIAGNOSIS — Z992 Dependence on renal dialysis: Secondary | ICD-10-CM | POA: Insufficient documentation

## 2020-02-27 DIAGNOSIS — E1122 Type 2 diabetes mellitus with diabetic chronic kidney disease: Secondary | ICD-10-CM | POA: Insufficient documentation

## 2020-02-27 DIAGNOSIS — Z794 Long term (current) use of insulin: Secondary | ICD-10-CM | POA: Insufficient documentation

## 2020-02-27 DIAGNOSIS — R079 Chest pain, unspecified: Secondary | ICD-10-CM

## 2020-02-27 DIAGNOSIS — I132 Hypertensive heart and chronic kidney disease with heart failure and with stage 5 chronic kidney disease, or end stage renal disease: Secondary | ICD-10-CM | POA: Insufficient documentation

## 2020-02-27 DIAGNOSIS — I10 Essential (primary) hypertension: Secondary | ICD-10-CM

## 2020-02-27 LAB — CBG MONITORING, ED: Glucose-Capillary: 68 mg/dL — ABNORMAL LOW (ref 70–99)

## 2020-02-27 MED ORDER — HYDROMORPHONE HCL 1 MG/ML IJ SOLN
1.0000 mg | Freq: Once | INTRAMUSCULAR | Status: AC
  Administered 2020-02-27: 1 mg via INTRAVENOUS
  Filled 2020-02-27: qty 1

## 2020-02-27 NOTE — ED Triage Notes (Signed)
Pt presents to ED with abd/back/ left arm/ bilateral leg pain. Seen for same yesterday. Last dialysis Friday.

## 2020-02-28 ENCOUNTER — Emergency Department (HOSPITAL_COMMUNITY): Payer: Medicare Other

## 2020-02-28 ENCOUNTER — Other Ambulatory Visit: Payer: Self-pay

## 2020-02-28 LAB — CBG MONITORING, ED
Glucose-Capillary: 101 mg/dL — ABNORMAL HIGH (ref 70–99)
Glucose-Capillary: 76 mg/dL (ref 70–99)

## 2020-02-28 MED ORDER — CARVEDILOL 25 MG PO TABS: 25.0000 mg | ORAL_TABLET | Freq: Two times a day (BID) | ORAL | 0 refills | Status: DC

## 2020-02-28 MED ORDER — APIXABAN 5 MG PO TABS
5.0000 mg | ORAL_TABLET | Freq: Once | ORAL | Status: AC
  Administered 2020-02-28: 5 mg via ORAL
  Filled 2020-02-28: qty 1

## 2020-02-28 MED ORDER — HYDRALAZINE HCL 100 MG PO TABS
100.0000 mg | ORAL_TABLET | Freq: Two times a day (BID) | ORAL | 0 refills | Status: DC
Start: 2020-02-28 — End: 2020-06-13

## 2020-02-28 MED ORDER — OXYCODONE HCL 5 MG PO TABS
10.0000 mg | ORAL_TABLET | Freq: Once | ORAL | Status: AC
  Administered 2020-02-28: 10 mg via ORAL
  Filled 2020-02-28: qty 2

## 2020-02-28 MED ORDER — LISINOPRIL 5 MG PO TABS: 5.0000 mg | ORAL_TABLET | Freq: Every day | ORAL | 0 refills | Status: AC

## 2020-02-28 NOTE — ED Notes (Addendum)
Pt never left premises after d/c. States he wants to check back in. Notified Dr. Leonette Monarch, who saw him earlier, and O2 levels were checked. 97% on RA
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
## 2020-02-28 NOTE — ED Notes (Signed)
Patient refusing to keep blood pressure cuff on to get a full set of vitals.

## 2020-03-02 ENCOUNTER — Encounter (HOSPITAL_COMMUNITY): Payer: Self-pay

## 2020-03-02 ENCOUNTER — Emergency Department (HOSPITAL_COMMUNITY): Payer: Medicare Other

## 2020-03-02 ENCOUNTER — Other Ambulatory Visit: Payer: Self-pay

## 2020-03-02 ENCOUNTER — Emergency Department (HOSPITAL_COMMUNITY)
Admission: EM | Admit: 2020-03-02 | Discharge: 2020-03-02 | Disposition: A | Discharge status: Home / Self Care | Payer: Medicare Other | Attending: Emergency Medicine | Admitting: Emergency Medicine

## 2020-03-02 DIAGNOSIS — W19XXXA Unspecified fall, initial encounter: Secondary | ICD-10-CM

## 2020-03-02 DIAGNOSIS — Y9389 Activity, other specified: Secondary | ICD-10-CM | POA: Diagnosis not present

## 2020-03-02 DIAGNOSIS — W1809XA Striking against other object with subsequent fall, initial encounter: Secondary | ICD-10-CM | POA: Diagnosis not present

## 2020-03-02 DIAGNOSIS — S62395D Other fracture of fourth metacarpal bone, left hand, subsequent encounter for fracture with routine healing: Secondary | ICD-10-CM | POA: Diagnosis not present

## 2020-03-02 DIAGNOSIS — S0512XA Contusion of eyeball and orbital tissues, left eye, initial encounter: Secondary | ICD-10-CM | POA: Diagnosis not present

## 2020-03-02 DIAGNOSIS — S0990XA Unspecified injury of head, initial encounter: Secondary | ICD-10-CM | POA: Diagnosis not present

## 2020-03-02 DIAGNOSIS — Y9289 Other specified places as the place of occurrence of the external cause: Secondary | ICD-10-CM | POA: Diagnosis not present

## 2020-03-02 DIAGNOSIS — Y998 Other external cause status: Secondary | ICD-10-CM | POA: Diagnosis not present

## 2020-03-02 MED ORDER — ONDANSETRON HCL 4 MG PO TABS
4.0000 mg | ORAL_TABLET | Freq: Once | ORAL | Status: DC
  Filled 2020-03-02: qty 1

## 2020-03-02 NOTE — Social Work (Signed)
TOC CSW Consult request has been received. CSW attempting to follow up at present time.   CSW will continue to follow for transportation needs.  Pt signed [employer], [employer], CSW called transport and faxed information.  Pt is waiting on Safe Transport.  Frank [name], MSW, [place] ED Transitions of CareClinical Social Worker [email or web address] [phone]

## 2020-03-02 NOTE — ED Notes (Signed)
Pt transported to CT and X-ray via stretcher.

## 2020-03-02 NOTE — ED Notes (Addendum)
Patient requesting Taxi voucher. Attempted to call SW, no response and VM box full. Attempted to call again, no response. Attempted to call AC, no response.

## 2020-03-02 NOTE — ED Triage Notes (Signed)
Pt presents with c/o fall that occurred today. Pt reports he was eating breakfast and his shoes got stuck in the chair and he fell on his face. Pt has a laceration to his forehead and some swelling above his right eye. Pt also has an old injury to his right arm.

## 2020-03-02 NOTE — ED Notes (Signed)
Patient refused X-ray and CT, stated he is in too much pain.
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
## 2020-03-02 NOTE — ED Notes (Signed)
patient agreeable to CT at this time, CT/X-ray notified.

## 2020-03-02 NOTE — Discharge Instructions (Addendum)
There were no serious problems found on the head or neck CAT scan.  You may have a headache for a while and need to take some Tylenol for it.  For the left hand fracture, we are referring you to a hand surgeon, Dr. Jeannie Fend who you can see in the next week or two for further care and treatment.

## 2020-03-05 ENCOUNTER — Other Ambulatory Visit: Payer: Self-pay

## 2020-03-05 ENCOUNTER — Encounter (HOSPITAL_COMMUNITY): Payer: Self-pay

## 2020-03-05 ENCOUNTER — Emergency Department (HOSPITAL_COMMUNITY)
Admission: EM | Admit: 2020-03-05 | Discharge: 2020-03-05 | Disposition: A | Discharge status: Home / Self Care | Payer: Medicare Other | Attending: Emergency Medicine | Admitting: Emergency Medicine

## 2020-03-05 ENCOUNTER — Emergency Department (HOSPITAL_COMMUNITY): Payer: Medicare Other

## 2020-03-05 DIAGNOSIS — Y929 Unspecified place or not applicable: Secondary | ICD-10-CM | POA: Insufficient documentation

## 2020-03-05 DIAGNOSIS — Z7901 Long term (current) use of anticoagulants: Secondary | ICD-10-CM | POA: Insufficient documentation

## 2020-03-05 DIAGNOSIS — I509 Heart failure, unspecified: Secondary | ICD-10-CM | POA: Insufficient documentation

## 2020-03-05 DIAGNOSIS — E1122 Type 2 diabetes mellitus with diabetic chronic kidney disease: Secondary | ICD-10-CM | POA: Diagnosis not present

## 2020-03-05 DIAGNOSIS — Z79899 Other long term (current) drug therapy: Secondary | ICD-10-CM | POA: Insufficient documentation

## 2020-03-05 DIAGNOSIS — Z7951 Long term (current) use of inhaled steroids: Secondary | ICD-10-CM | POA: Insufficient documentation

## 2020-03-05 DIAGNOSIS — W19XXXA Unspecified fall, initial encounter: Secondary | ICD-10-CM | POA: Diagnosis not present

## 2020-03-05 DIAGNOSIS — N186 End stage renal disease: Secondary | ICD-10-CM | POA: Insufficient documentation

## 2020-03-05 DIAGNOSIS — Y999 Unspecified external cause status: Secondary | ICD-10-CM | POA: Insufficient documentation

## 2020-03-05 DIAGNOSIS — I132 Hypertensive heart and chronic kidney disease with heart failure and with stage 5 chronic kidney disease, or end stage renal disease: Secondary | ICD-10-CM | POA: Diagnosis not present

## 2020-03-05 DIAGNOSIS — Y939 Activity, unspecified: Secondary | ICD-10-CM | POA: Diagnosis not present

## 2020-03-05 DIAGNOSIS — M25551 Pain in right hip: Secondary | ICD-10-CM | POA: Diagnosis not present

## 2020-03-05 DIAGNOSIS — Z955 Presence of coronary angioplasty implant and graft: Secondary | ICD-10-CM | POA: Diagnosis not present

## 2020-03-05 DIAGNOSIS — R112 Nausea with vomiting, unspecified: Secondary | ICD-10-CM | POA: Insufficient documentation

## 2020-03-05 DIAGNOSIS — Z87891 Personal history of nicotine dependence: Secondary | ICD-10-CM | POA: Insufficient documentation

## 2020-03-05 DIAGNOSIS — J441 Chronic obstructive pulmonary disease with (acute) exacerbation: Secondary | ICD-10-CM | POA: Insufficient documentation

## 2020-03-05 DIAGNOSIS — M25559 Pain in unspecified hip: Secondary | ICD-10-CM

## 2020-03-05 DIAGNOSIS — Z992 Dependence on renal dialysis: Secondary | ICD-10-CM | POA: Insufficient documentation

## 2020-03-05 LAB — CBG MONITORING, ED: Glucose-Capillary: 78 mg/dL (ref 70–99)

## 2020-03-05 MED ORDER — OXYCODONE HCL 5 MG PO TABS
10.0000 mg | ORAL_TABLET | ORAL | Status: AC
  Administered 2020-03-05: 10 mg via ORAL
  Filled 2020-03-05: qty 2

## 2020-03-05 NOTE — ED Notes (Addendum)
I ttried to raise the left bed rail and the patient refused. He is on the edge of the left side of the bed and falling asleep. I requested to raise the bed rail three times and he refused each time. He said, "I want to sleep." I let him know that he was falling asleep and very close to the left edge of the bed. He refused to move to the right side.

## 2020-03-05 NOTE — Discharge Instructions (Addendum)
Your x-rays were limited as you declined full x-ray imaging. There is no evidence of fracture of the x-rays that were obtained.  As you declined blood work we are unable to check your electrolytes however these can be checked at your dialysis center.  Please continue to use your home pain medications as they are prescribed to you.  Please follow-up with your primary care doctor.

## 2020-03-05 NOTE — ED Triage Notes (Signed)
Pt reports that he had a mechanical fall a couple of days ago and is now experiencing R hip pain. States that he was seen for same fall. Pt also reports vomiting today. No vomiting noted in triage.
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
## 2020-03-08 ENCOUNTER — Encounter (HOSPITAL_COMMUNITY): Payer: Self-pay

## 2020-03-08 ENCOUNTER — Inpatient Hospital Stay (HOSPITAL_COMMUNITY)
Admission: EM | Admit: 2020-03-08 | Discharge: 2020-03-13 | DRG: 175 | Disposition: A | Discharge status: Home / Self Care | Payer: Medicare Other | Attending: Family Medicine | Admitting: Family Medicine

## 2020-03-08 DIAGNOSIS — K7460 Unspecified cirrhosis of liver: Secondary | ICD-10-CM | POA: Diagnosis present

## 2020-03-08 DIAGNOSIS — I5042 Chronic combined systolic (congestive) and diastolic (congestive) heart failure: Secondary | ICD-10-CM | POA: Diagnosis present

## 2020-03-08 DIAGNOSIS — I152 Hypertension secondary to endocrine disorders: Secondary | ICD-10-CM | POA: Diagnosis present

## 2020-03-08 DIAGNOSIS — G8929 Other chronic pain: Secondary | ICD-10-CM | POA: Diagnosis present

## 2020-03-08 DIAGNOSIS — E1122 Type 2 diabetes mellitus with diabetic chronic kidney disease: Secondary | ICD-10-CM | POA: Diagnosis present

## 2020-03-08 DIAGNOSIS — Z8249 Family history of ischemic heart disease and other diseases of the circulatory system: Secondary | ICD-10-CM

## 2020-03-08 DIAGNOSIS — Z9115 Patient's noncompliance with renal dialysis: Secondary | ICD-10-CM

## 2020-03-08 DIAGNOSIS — Z91199 Patient's noncompliance with other medical treatment and regimen due to unspecified reason: Secondary | ICD-10-CM

## 2020-03-08 DIAGNOSIS — Z87891 Personal history of nicotine dependence: Secondary | ICD-10-CM

## 2020-03-08 DIAGNOSIS — G4733 Obstructive sleep apnea (adult) (pediatric): Secondary | ICD-10-CM | POA: Diagnosis present

## 2020-03-08 DIAGNOSIS — I700 Atherosclerosis of aorta: Secondary | ICD-10-CM | POA: Diagnosis present

## 2020-03-08 DIAGNOSIS — E1129 Type 2 diabetes mellitus with other diabetic kidney complication: Secondary | ICD-10-CM | POA: Diagnosis present

## 2020-03-08 DIAGNOSIS — Z9114 Patient's other noncompliance with medication regimen: Secondary | ICD-10-CM

## 2020-03-08 DIAGNOSIS — R109 Unspecified abdominal pain: Secondary | ICD-10-CM

## 2020-03-08 DIAGNOSIS — I2699 Other pulmonary embolism without acute cor pulmonale: Principal | ICD-10-CM | POA: Diagnosis present

## 2020-03-08 DIAGNOSIS — Z992 Dependence on renal dialysis: Secondary | ICD-10-CM

## 2020-03-08 DIAGNOSIS — N2581 Secondary hyperparathyroidism of renal origin: Secondary | ICD-10-CM | POA: Diagnosis present

## 2020-03-08 DIAGNOSIS — G894 Chronic pain syndrome: Secondary | ICD-10-CM | POA: Diagnosis present

## 2020-03-08 DIAGNOSIS — T8612 Kidney transplant failure: Secondary | ICD-10-CM | POA: Diagnosis present

## 2020-03-08 DIAGNOSIS — J9621 Acute and chronic respiratory failure with hypoxia: Secondary | ICD-10-CM | POA: Diagnosis present

## 2020-03-08 DIAGNOSIS — D631 Anemia in chronic kidney disease: Secondary | ICD-10-CM | POA: Diagnosis present

## 2020-03-08 DIAGNOSIS — Z9119 Patient's noncompliance with other medical treatment and regimen: Secondary | ICD-10-CM

## 2020-03-08 DIAGNOSIS — Z86718 Personal history of other venous thrombosis and embolism: Secondary | ICD-10-CM

## 2020-03-08 DIAGNOSIS — I313 Pericardial effusion (noninflammatory): Secondary | ICD-10-CM | POA: Diagnosis present

## 2020-03-08 DIAGNOSIS — E1159 Type 2 diabetes mellitus with other circulatory complications: Secondary | ICD-10-CM | POA: Diagnosis present

## 2020-03-08 DIAGNOSIS — N186 End stage renal disease: Secondary | ICD-10-CM

## 2020-03-08 DIAGNOSIS — IMO0002 Reserved for concepts with insufficient information to code with codable children: Secondary | ICD-10-CM | POA: Diagnosis present

## 2020-03-08 DIAGNOSIS — I132 Hypertensive heart and chronic kidney disease with heart failure and with stage 5 chronic kidney disease, or end stage renal disease: Secondary | ICD-10-CM | POA: Diagnosis present

## 2020-03-08 DIAGNOSIS — J90 Pleural effusion, not elsewhere classified: Secondary | ICD-10-CM | POA: Diagnosis present

## 2020-03-08 DIAGNOSIS — R1110 Vomiting, unspecified: Secondary | ICD-10-CM

## 2020-03-08 DIAGNOSIS — E1169 Type 2 diabetes mellitus with other specified complication: Secondary | ICD-10-CM | POA: Diagnosis present

## 2020-03-08 DIAGNOSIS — J449 Chronic obstructive pulmonary disease, unspecified: Secondary | ICD-10-CM | POA: Diagnosis present

## 2020-03-08 DIAGNOSIS — K861 Other chronic pancreatitis: Secondary | ICD-10-CM | POA: Diagnosis present

## 2020-03-08 DIAGNOSIS — Z20822 Contact with and (suspected) exposure to covid-19: Secondary | ICD-10-CM | POA: Diagnosis present

## 2020-03-08 DIAGNOSIS — Z7901 Long term (current) use of anticoagulants: Secondary | ICD-10-CM

## 2020-03-08 HISTORY — DX: Pseudocyst of pancreas: K86.3

## 2020-03-08 HISTORY — DX: Pain in left hip: M25.552

## 2020-03-08 HISTORY — DX: Other specified counseling: Z71.89

## 2020-03-08 HISTORY — DX: Presence of other specified functional implants: Z96.89

## 2020-03-08 HISTORY — DX: Pneumothorax, unspecified: J93.9

## 2020-03-08 HISTORY — DX: Chronic obstructive pulmonary disease with (acute) exacerbation: J44.1

## 2020-03-08 NOTE — ED Notes (Signed)
Pt ambulated to [employer] and back. Pt currently eating in lobby.

## 2020-03-08 NOTE — ED Triage Notes (Signed)
Pt arrives POV for eval of SOB, N/V and fluid build u/ Reports last dialysis today, but did not complete d/t blood clots. Reports been feeling this way for the last few days

## 2020-03-08 NOTE — Care Management (Signed)
ED CM noted patient to be in the waiting room patient has had 36 ED visits 3 resulting in admissions in the past 6 months this patient is known to this CM. Patient is on HD, he was discharged from the [facility] ED 8/15, patient frequently misses his HD appointments.  Patient was enrolled in the  [facility], but was discharge due to non compliance.  Patient denies any transportation  barriers and states he lives with a friend.  TOC team will speak with patient to assist with coordination of care,  once he is roomed.

## 2020-03-09 ENCOUNTER — Emergency Department (HOSPITAL_COMMUNITY): Payer: Medicare Other

## 2020-03-09 DIAGNOSIS — I2699 Other pulmonary embolism without acute cor pulmonale: Secondary | ICD-10-CM | POA: Diagnosis present

## 2020-03-09 LAB — BASIC METABOLIC PANEL
Anion gap: 11 (ref 5–15)
BUN: 32 mg/dL — ABNORMAL HIGH (ref 6–20)
CO2: 26 mmol/L (ref 22–32)
Calcium: 8.8 mg/dL — ABNORMAL LOW (ref 8.9–10.3)
Chloride: 103 mmol/L (ref 98–111)
Creatinine, Ser: 7.65 mg/dL — ABNORMAL HIGH (ref 0.61–1.24)
GFR calc Af Amer: 8 mL/min — ABNORMAL LOW (ref >60)
GFR calc non Af Amer: 7 mL/min — ABNORMAL LOW (ref >60)
Glucose, Bld: 78 mg/dL (ref 70–99)
Potassium: 4.5 mmol/L (ref 3.5–5.1)
Sodium: 140 mmol/L (ref 135–145)

## 2020-03-09 LAB — CBC
HCT: 30.5 % — ABNORMAL LOW (ref 39.0–52.0)
Hemoglobin: 8.9 g/dL — ABNORMAL LOW (ref 13.0–17.0)
MCH: 25.6 pg — ABNORMAL LOW (ref 26.0–34.0)
MCHC: 29.2 g/dL — ABNORMAL LOW (ref 30.0–36.0)
MCV: 87.9 fL (ref 80.0–100.0)
Platelets: 129 10*3/uL — ABNORMAL LOW (ref 150–400)
RBC: 3.47 MIL/uL — ABNORMAL LOW (ref 4.22–5.81)
RDW: 19.2 % — ABNORMAL HIGH (ref 11.5–15.5)
WBC: 3.2 10*3/uL — ABNORMAL LOW (ref 4.0–10.5)
nRBC: 0 % (ref 0.0–0.2)

## 2020-03-09 LAB — LIPASE, BLOOD: Lipase: 28 U/L (ref 11–51)

## 2020-03-09 LAB — TROPONIN I (HIGH SENSITIVITY): Troponin I (High Sensitivity): 22 ng/L — ABNORMAL HIGH (ref <18)

## 2020-03-09 MED ORDER — IOHEXOL 350 MG/ML SOLN
80.0000 mL | Freq: Once | INTRAVENOUS | Status: AC | PRN
  Administered 2020-03-09: 80 mL via INTRAVENOUS

## 2020-03-09 MED ORDER — PANTOPRAZOLE SODIUM 40 MG PO TBEC
40.0000 mg | DELAYED_RELEASE_TABLET | Freq: Every day | ORAL | Status: DC
  Administered 2020-03-10 – 2020-03-12 (×3): 40 mg via ORAL
  Filled 2020-03-09 (×3): qty 1

## 2020-03-09 MED ORDER — DICLOFENAC SODIUM 1 % EX GEL
4.0000 g | Freq: Four times a day (QID) | CUTANEOUS | Status: DC
  Filled 2020-03-09: qty 100

## 2020-03-09 MED ORDER — NITROGLYCERIN 0.4 MG SL SUBL: 0.4000 mg | SUBLINGUAL_TABLET | SUBLINGUAL | Status: DC | PRN

## 2020-03-09 MED ORDER — HYDRALAZINE HCL 25 MG PO TABS
100.0000 mg | ORAL_TABLET | Freq: Two times a day (BID) | ORAL | Status: DC
  Administered 2020-03-09 – 2020-03-12 (×2): 100 mg via ORAL
  Filled 2020-03-09 (×3): qty 4

## 2020-03-09 MED ORDER — APIXABAN 5 MG PO TABS
10.0000 mg | ORAL_TABLET | Freq: Two times a day (BID) | ORAL | Status: DC
  Administered 2020-03-09 – 2020-03-12 (×7): 10 mg via ORAL
  Filled 2020-03-09 (×7): qty 2

## 2020-03-09 MED ORDER — ALBUTEROL SULFATE (2.5 MG/3ML) 0.083% IN NEBU: 2.5000 mg | INHALATION_SOLUTION | RESPIRATORY_TRACT | Status: DC | PRN

## 2020-03-09 MED ORDER — ONDANSETRON HCL 4 MG/2ML IJ SOLN
4.0000 mg | Freq: Once | INTRAMUSCULAR | Status: AC
  Administered 2020-03-09: 4 mg via INTRAVENOUS
  Filled 2020-03-09: qty 2

## 2020-03-09 MED ORDER — CARVEDILOL 25 MG PO TABS
25.0000 mg | ORAL_TABLET | Freq: Two times a day (BID) | ORAL | Status: DC
  Administered 2020-03-10 – 2020-03-12 (×4): 25 mg via ORAL
  Filled 2020-03-09 (×2): qty 1
  Filled 2020-03-09 (×2): qty 2

## 2020-03-09 MED ORDER — OXYCODONE HCL 5 MG PO TABS
20.0000 mg | ORAL_TABLET | Freq: Four times a day (QID) | ORAL | Status: DC | PRN
  Administered 2020-03-10 – 2020-03-13 (×7): 20 mg via ORAL
  Filled 2020-03-09 (×6): qty 4

## 2020-03-09 MED ORDER — AMLODIPINE BESYLATE 10 MG PO TABS
10.0000 mg | ORAL_TABLET | Freq: Every day | ORAL | Status: DC
  Administered 2020-03-09 – 2020-03-12 (×3): 10 mg via ORAL
  Filled 2020-03-09 (×2): qty 2
  Filled 2020-03-09: qty 1

## 2020-03-09 MED ORDER — OXYCODONE HCL 5 MG PO TABS
15.0000 mg | ORAL_TABLET | Freq: Once | ORAL | Status: AC
  Administered 2020-03-09: 15 mg via ORAL
  Filled 2020-03-09: qty 3

## 2020-03-09 MED ORDER — APIXABAN 5 MG PO TABS: 5.0000 mg | ORAL_TABLET | Freq: Two times a day (BID) | ORAL | Status: DC

## 2020-03-09 MED ORDER — ONDANSETRON HCL 4 MG/2ML IJ SOLN: 4.0000 mg | Freq: Four times a day (QID) | INTRAMUSCULAR | Status: DC | PRN

## 2020-03-09 MED ORDER — HYDROMORPHONE HCL 1 MG/ML IJ SOLN: 0.5000 mg | INTRAMUSCULAR | Status: DC | PRN

## 2020-03-09 MED ORDER — LIDOCAINE 4 % EX CREA
TOPICAL_CREAM | Freq: Three times a day (TID) | CUTANEOUS | Status: DC
  Administered 2020-03-10 (×2): 1 via TOPICAL
  Filled 2020-03-09: qty 5

## 2020-03-09 MED ORDER — TEMAZEPAM 15 MG PO CAPS
15.0000 mg | ORAL_CAPSULE | Freq: Every day | ORAL | Status: DC
  Administered 2020-03-10 – 2020-03-12 (×3): 15 mg via ORAL
  Filled 2020-03-09 (×3): qty 1

## 2020-03-09 MED ORDER — UMECLIDINIUM BROMIDE 62.5 MCG/INH IN AEPB
1.0000 | INHALATION_SPRAY | Freq: Every day | RESPIRATORY_TRACT | Status: DC
  Administered 2020-03-10: 1 via RESPIRATORY_TRACT
  Filled 2020-03-09: qty 7
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
## 2020-03-09 NOTE — ED Notes (Signed)
Pharmacy messaged regarding needed medications
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
## 2020-03-09 NOTE — ED Provider Notes (Signed)
Patient care transferred to me. His CTA shows small right sided PE. He has not been taking his eliquis. He is known to be noncompliant, though he states it's because of vomiting. He has CP/SoB, unclear if from this PE as he often comes in for painful complaints, including chest pain. Family practice consulted.     Sherwood Gambler, MD 03/09/20 [phone]

## 2020-03-10 ENCOUNTER — Observation Stay (HOSPITAL_COMMUNITY): Payer: Medicare Other

## 2020-03-10 ENCOUNTER — Other Ambulatory Visit: Payer: Self-pay

## 2020-03-10 ENCOUNTER — Encounter (HOSPITAL_COMMUNITY): Payer: Self-pay | Admitting: Family Medicine

## 2020-03-10 DIAGNOSIS — E1169 Type 2 diabetes mellitus with other specified complication: Secondary | ICD-10-CM | POA: Diagnosis present

## 2020-03-10 DIAGNOSIS — G4733 Obstructive sleep apnea (adult) (pediatric): Secondary | ICD-10-CM | POA: Diagnosis present

## 2020-03-10 DIAGNOSIS — N2581 Secondary hyperparathyroidism of renal origin: Secondary | ICD-10-CM | POA: Diagnosis present

## 2020-03-10 DIAGNOSIS — R109 Unspecified abdominal pain: Secondary | ICD-10-CM | POA: Diagnosis not present

## 2020-03-10 DIAGNOSIS — I2699 Other pulmonary embolism without acute cor pulmonale: Secondary | ICD-10-CM | POA: Diagnosis present

## 2020-03-10 DIAGNOSIS — T8612 Kidney transplant failure: Secondary | ICD-10-CM | POA: Diagnosis present

## 2020-03-10 DIAGNOSIS — I132 Hypertensive heart and chronic kidney disease with heart failure and with stage 5 chronic kidney disease, or end stage renal disease: Secondary | ICD-10-CM | POA: Diagnosis present

## 2020-03-10 DIAGNOSIS — Z9115 Patient's noncompliance with renal dialysis: Secondary | ICD-10-CM | POA: Diagnosis not present

## 2020-03-10 DIAGNOSIS — E1122 Type 2 diabetes mellitus with diabetic chronic kidney disease: Secondary | ICD-10-CM | POA: Diagnosis present

## 2020-03-10 DIAGNOSIS — J9621 Acute and chronic respiratory failure with hypoxia: Secondary | ICD-10-CM | POA: Diagnosis present

## 2020-03-10 DIAGNOSIS — D631 Anemia in chronic kidney disease: Secondary | ICD-10-CM | POA: Diagnosis present

## 2020-03-10 DIAGNOSIS — I313 Pericardial effusion (noninflammatory): Secondary | ICD-10-CM | POA: Diagnosis present

## 2020-03-10 DIAGNOSIS — Z992 Dependence on renal dialysis: Secondary | ICD-10-CM | POA: Diagnosis not present

## 2020-03-10 DIAGNOSIS — J449 Chronic obstructive pulmonary disease, unspecified: Secondary | ICD-10-CM | POA: Diagnosis present

## 2020-03-10 DIAGNOSIS — Z7901 Long term (current) use of anticoagulants: Secondary | ICD-10-CM | POA: Diagnosis not present

## 2020-03-10 DIAGNOSIS — R1084 Generalized abdominal pain: Secondary | ICD-10-CM | POA: Diagnosis not present

## 2020-03-10 DIAGNOSIS — I2693 Single subsegmental pulmonary embolism without acute cor pulmonale: Secondary | ICD-10-CM | POA: Diagnosis not present

## 2020-03-10 DIAGNOSIS — Z9114 Patient's other noncompliance with medication regimen: Secondary | ICD-10-CM | POA: Diagnosis not present

## 2020-03-10 DIAGNOSIS — Z86718 Personal history of other venous thrombosis and embolism: Secondary | ICD-10-CM | POA: Diagnosis not present

## 2020-03-10 DIAGNOSIS — I5042 Chronic combined systolic (congestive) and diastolic (congestive) heart failure: Secondary | ICD-10-CM | POA: Diagnosis present

## 2020-03-10 DIAGNOSIS — I152 Hypertension secondary to endocrine disorders: Secondary | ICD-10-CM | POA: Diagnosis present

## 2020-03-10 DIAGNOSIS — Z8249 Family history of ischemic heart disease and other diseases of the circulatory system: Secondary | ICD-10-CM | POA: Diagnosis not present

## 2020-03-10 DIAGNOSIS — G894 Chronic pain syndrome: Secondary | ICD-10-CM | POA: Diagnosis present

## 2020-03-10 DIAGNOSIS — K861 Other chronic pancreatitis: Secondary | ICD-10-CM | POA: Diagnosis present

## 2020-03-10 DIAGNOSIS — I5030 Unspecified diastolic (congestive) heart failure: Secondary | ICD-10-CM | POA: Insufficient documentation

## 2020-03-10 DIAGNOSIS — N186 End stage renal disease: Secondary | ICD-10-CM | POA: Diagnosis present

## 2020-03-10 DIAGNOSIS — N250 Renal osteodystrophy: Secondary | ICD-10-CM

## 2020-03-10 DIAGNOSIS — I700 Atherosclerosis of aorta: Secondary | ICD-10-CM | POA: Diagnosis present

## 2020-03-10 DIAGNOSIS — K7460 Unspecified cirrhosis of liver: Secondary | ICD-10-CM | POA: Diagnosis present

## 2020-03-10 DIAGNOSIS — Z20822 Contact with and (suspected) exposure to covid-19: Secondary | ICD-10-CM | POA: Diagnosis present

## 2020-03-10 HISTORY — DX: Renal osteodystrophy: N25.0

## 2020-03-10 LAB — CBC
HCT: 28.2 % — ABNORMAL LOW (ref 39.0–52.0)
HCT: 27.3 % — ABNORMAL LOW (ref 39.0–52.0)
Hemoglobin: 8.3 g/dL — ABNORMAL LOW (ref 13.0–17.0)
Hemoglobin: 8.4 g/dL — ABNORMAL LOW (ref 13.0–17.0)
MCH: 26 pg (ref 26.0–34.0)
MCH: 26.3 pg (ref 26.0–34.0)
MCHC: 29.4 g/dL — ABNORMAL LOW (ref 30.0–36.0)
MCHC: 30.8 g/dL (ref 30.0–36.0)
MCV: 88.4 fL (ref 80.0–100.0)
MCV: 85.6 fL (ref 80.0–100.0)
Platelets: 125 10*3/uL — ABNORMAL LOW (ref 150–400)
Platelets: 121 10*3/uL — ABNORMAL LOW (ref 150–400)
RBC: 3.19 MIL/uL — ABNORMAL LOW (ref 4.22–5.81)
RBC: 3.19 MIL/uL — ABNORMAL LOW (ref 4.22–5.81)
RDW: 18.9 % — ABNORMAL HIGH (ref 11.5–15.5)
RDW: 18.8 % — ABNORMAL HIGH (ref 11.5–15.5)
WBC: 2.9 10*3/uL — ABNORMAL LOW (ref 4.0–10.5)
WBC: 3 10*3/uL — ABNORMAL LOW (ref 4.0–10.5)
nRBC: 0 % (ref 0.0–0.2)
nRBC: 0 % (ref 0.0–0.2)

## 2020-03-10 LAB — RENAL FUNCTION PANEL
Albumin: 2.8 g/dL — ABNORMAL LOW (ref 3.5–5.0)
Anion gap: 8 (ref 5–15)
BUN: 40 mg/dL — ABNORMAL HIGH (ref 6–20)
CO2: 27 mmol/L (ref 22–32)
Calcium: 8.3 mg/dL — ABNORMAL LOW (ref 8.9–10.3)
Chloride: 103 mmol/L (ref 98–111)
Creatinine, Ser: 8.29 mg/dL — ABNORMAL HIGH (ref 0.61–1.24)
GFR calc Af Amer: 8 mL/min — ABNORMAL LOW (ref >60)
GFR calc non Af Amer: 7 mL/min — ABNORMAL LOW (ref >60)
Glucose, Bld: 96 mg/dL (ref 70–99)
Phosphorus: 4.5 mg/dL (ref 2.5–4.6)
Potassium: 4.9 mmol/L (ref 3.5–5.1)
Sodium: 138 mmol/L (ref 135–145)

## 2020-03-10 LAB — SARS CORONAVIRUS 2 BY RT PCR (HOSPITAL ORDER, PERFORMED IN ~~LOC~~ HOSPITAL LAB): SARS Coronavirus 2: NEGATIVE

## 2020-03-10 LAB — MAGNESIUM: Magnesium: 1.8 mg/dL (ref 1.7–2.4)

## 2020-03-10 MED ORDER — RENA-VITE PO TABS: 1.0000 | ORAL_TABLET | Freq: Every day | ORAL | Status: DC

## 2020-03-10 MED ORDER — DARBEPOETIN ALFA 200 MCG/0.4ML IJ SOSY
200.0000 ug | PREFILLED_SYRINGE | INTRAMUSCULAR | Status: DC
  Filled 2020-03-10: qty 0.4

## 2020-03-10 MED ORDER — HEPARIN SODIUM (PORCINE) 1000 UNIT/ML IJ SOLN
INTRAMUSCULAR | Status: AC
  Filled 2020-03-10: qty 4

## 2020-03-10 MED ORDER — LUBRIDERM SERIOUSLY SENSITIVE EX LOTN
TOPICAL_LOTION | CUTANEOUS | Status: DC | PRN
  Filled 2020-03-10: qty 473

## 2020-03-10 MED ORDER — NICOTINE 21 MG/24HR TD PT24: 21.0000 mg | MEDICATED_PATCH | Freq: Every day | TRANSDERMAL | Status: DC

## 2020-03-10 MED ORDER — NEPRO/CARBSTEADY PO LIQD
237.0000 mL | Freq: Two times a day (BID) | ORAL | Status: DC | PRN
  Filled 2020-03-10: qty 237

## 2020-03-10 NOTE — Progress Notes (Addendum)
In reviewing today's notes it appears that RN in ED attempted to page [place] in FM but neither the intern or upper level received any pages last night or this morning. Please page provider number listed on Amion: [phone] if any concerns regarding this patient. Thank you!

## 2020-03-10 NOTE — ED Notes (Addendum)
Pt placed on hospital bed for comfort. Pt is sitting up and eating breakfast currently.

## 2020-03-10 NOTE — ED Notes (Signed)
Pt maintains that he refuses all scans for care without IV dilaudid. Text page sent to provider. Previous RN states that she also paged without response.
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
## 2020-03-10 NOTE — Progress Notes (Signed)
RT setup cpap in pt room at this time, pt stated he was still eating and would place self on when ready. O2 connected and cpap ready to be used at bedside.
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
## 2020-03-10 NOTE — ED Notes (Signed)
Patient refused to go to xray due to not having IV pain medicine and wanting to speak to the doctor. This RN made patient aware that he had pain medicine ordered in pill form, but the patient refused it. Provider has been paged. Pt still refusing to go to Xray at time.

## 2020-03-10 NOTE — Progress Notes (Signed)
Patient returned to room from hemodialysis. Alert and oriented x4. Refused to have blood pressure taken  Because his arm is cramping. He requested to have something to eat, advised him that he needs to be NPO for CT scan and can eat after but got very upset. He said they can do the CT scan in the morning. Called  CT and made them aware to do CT in am per patient request.
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
## 2020-03-10 NOTE — ED Notes (Signed)
Pt motions for RN to come to bedside in hallway, states that he will now try the PO pain relief.

## 2020-03-10 NOTE — ED Notes (Signed)
Breakfast ordered 

## 2020-03-10 NOTE — ED Notes (Signed)
PAGED FM TO RN BRITTANY H--Farrell Pantaleo

## 2020-03-11 ENCOUNTER — Encounter (HOSPITAL_COMMUNITY): Payer: Self-pay | Admitting: Family Medicine

## 2020-03-11 ENCOUNTER — Inpatient Hospital Stay (HOSPITAL_COMMUNITY): Payer: Medicare Other

## 2020-03-11 DIAGNOSIS — Z992 Dependence on renal dialysis: Secondary | ICD-10-CM

## 2020-03-11 DIAGNOSIS — I2693 Single subsegmental pulmonary embolism without acute cor pulmonale: Secondary | ICD-10-CM

## 2020-03-11 DIAGNOSIS — N186 End stage renal disease: Secondary | ICD-10-CM

## 2020-03-11 DIAGNOSIS — R1084 Generalized abdominal pain: Secondary | ICD-10-CM

## 2020-03-11 LAB — CBC
HCT: 29 % — ABNORMAL LOW (ref 39.0–52.0)
Hemoglobin: 8.8 g/dL — ABNORMAL LOW (ref 13.0–17.0)
MCH: 26.3 pg (ref 26.0–34.0)
MCHC: 30.3 g/dL (ref 30.0–36.0)
MCV: 86.6 fL (ref 80.0–100.0)
Platelets: 119 10*3/uL — ABNORMAL LOW (ref 150–400)
RBC: 3.35 MIL/uL — ABNORMAL LOW (ref 4.22–5.81)
RDW: 18.9 % — ABNORMAL HIGH (ref 11.5–15.5)
WBC: 3.1 10*3/uL — ABNORMAL LOW (ref 4.0–10.5)
nRBC: 0 % (ref 0.0–0.2)

## 2020-03-11 LAB — GLUCOSE, CAPILLARY
Glucose-Capillary: 72 mg/dL (ref 70–99)
Glucose-Capillary: 97 mg/dL (ref 70–99)
Glucose-Capillary: 97 mg/dL (ref 70–99)

## 2020-03-11 MED ORDER — DARBEPOETIN ALFA 200 MCG/0.4ML IJ SOSY
200.0000 ug | PREFILLED_SYRINGE | INTRAMUSCULAR | Status: DC
  Administered 2020-03-13: 200 ug via INTRAVENOUS
  Filled 2020-03-11: qty 0.4

## 2020-03-11 MED ORDER — IOHEXOL 350 MG/ML SOLN
100.0000 mL | Freq: Once | INTRAVENOUS | Status: AC | PRN
  Administered 2020-03-11: 100 mL via INTRAVENOUS

## 2020-03-11 MED ORDER — CHLORHEXIDINE GLUCONATE CLOTH 2 % EX PADS
6.0000 | MEDICATED_PAD | Freq: Every day | CUTANEOUS | Status: DC
  Administered 2020-03-12: 6 via TOPICAL

## 2020-03-11 NOTE — Progress Notes (Signed)
Assessed for IV with pt's permission after speaking with MD. No appropriate veins found. RN made aware.

## 2020-03-11 NOTE — Progress Notes (Signed)
Pt to go to CT for angio now and they will check the NSL he has, make sure it is patent for the power injector and use. Pt informed.
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
## 2020-03-11 NOTE — Progress Notes (Signed)
MD made aware that per CT protocol for CT angio, a new IV not over 24 hrs old and large bore needle is required for the IV contrast. IV team  consult done but IV RN unable to start new IV due to limited veins.MD notified, will speak with CT and call RN for update.

## 2020-03-11 NOTE — Progress Notes (Signed)
Responded to consult for IV for CT. Pt declines additional IV due to having IV that is still working. Pt has limited veins with an arm restriction. RN speaking with MD.

## 2020-03-11 NOTE — Progress Notes (Signed)
Anti hypertensives held due to plan for an additional HD tonight.

## 2020-03-11 NOTE — Plan of Care (Signed)
  Problem: Education: Goal: Knowledge of General Education information will improve Description: Including pain rating scale, medication(s)/side effects and non-pharmacologic comfort measures Outcome: Progressing

## 2020-03-12 MED ORDER — CHLORHEXIDINE GLUCONATE CLOTH 2 % EX PADS: 6.0000 | MEDICATED_PAD | Freq: Every day | CUTANEOUS | Status: DC

## 2020-03-12 NOTE — Progress Notes (Signed)
Home after hemodialysis 03/13/20
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
## 2020-03-12 NOTE — Progress Notes (Signed)
ANTICOAGULATION CONSULT NOTE - Initial Consult  Pharmacy Consult for apixaban Indication: pulmonary embolus  Allergies  Allergen Reactions  . Butalbital Anaphylaxis and Other (See Comments)  . Butalbital-Apap-Caffeine Shortness Of Breath, Swelling and Other (See Comments)    Swelling in throat  . Minoxidil Shortness Of Breath and Other (See Comments)  . Na Ferric Gluc Cplx In Sucrose Shortness Of Breath, Swelling and Other (See Comments)    Swelling in throat, tolerates Venofor  . Tylenol [Acetaminophen] Anaphylaxis and Swelling  . Darvocet [Propoxyphene N-Acetaminophen] Hives  . Other Hives and Other (See Comments)    Allergy listed on file from [facility] (Pt did not mention any allergies) Pt able to do norco (tylenol)    Patient Measurements: Weight 73.8 kg (162 lb 12.8 oz) Height 6'2" (188 cm)  Vital Signs: Temp: 97.5 F (36.4 C) (08/22 0500) Temp Source: Oral (08/22 0500) BP: 147/63 (08/22 0500) Pulse Rate: 60 (08/22 0500)  Labs: Recent Labs    03/09/20 1700 03/09/20 1700 03/10/20 1850 03/10/20 1850 03/10/20 2230 03/11/20 0414  HGB 8.9*   < > 8.3*   < > 8.4* 8.8*  HCT 30.5*   < > 28.2*  --  27.3* 29.0*  PLT 129*   < > 125*  --  121* 119*  CREATININE 7.65*  --  8.29*  --   --   --   TROPONINIHS 22*  --   --   --   --   --    < > = values in this interval not displayed.    Estimated Creatinine Clearance: 10.4 mL/min (A) (by C-G formula based on SCr of 8.29 mg/dL (H)).  Assessment: 53 YOM noncompliant HD-MWF patient presents with shortness of breath, nausea and vomiting. Patient is also noncompliant with prior-to-admission Eliquis (Apixaban) for history of pulmonary embolism.   The patient's CT is positive for a small pulmonary embolism. Pharmacy is consulted to dose apixaban. Upon discussion with the provider, Dr. Regenia Skeeter does think that this is a new pulmonary embolism.   Due to the fact that this is likely a new pulmonary embolism and the patient was  noncompliant to this medication PTA, patient was started on 10 mg PO BID x7 days then will decrease dose to 5 mg PO BID thereafter. The patient's CBC is stable, no issues of bleeding noted.   Goal of Therapy:  Monitor platelets by anticoagulation protocol: Yes   Plan:  - Patient currently on Day 4 of 10 mg BID dose, will transition to 5 mg PO BID starting 03/16/2020 - Monitor for signs and symptoms of bleeding - Monitor daily CBC     Niana Martorana L. Devin Going, [place] PGY2 Pharmacy Resident Weekends 7:00 am - 3:00 pm, please call [phone] 03/12/20      12:00 PM  Please check AMION for all [place] [place] phone numbers After 10:00 PM, call the [facility] [phone]

## 2020-03-12 NOTE — Progress Notes (Signed)
Pt states he places self on/off cpap as needed.  RT will continue to monitor.
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
## 2020-03-12 NOTE — Progress Notes (Signed)
Pt was mean to this nurse , applied lotion on his legs and feet while sitting on the edge of the bed and I instructed him to lie down on his bed bec its hurting my back if he is sitting down and he got mad, I rubbed the lotion for about 2 minutes on both his legs and he is asking to rub for a long time and I said that I cant right now bec I still have to give other pt's medicine and pt got angry at this nurse.

## 2020-03-13 LAB — RENAL FUNCTION PANEL
Albumin: 3 g/dL — ABNORMAL LOW (ref 3.5–5.0)
Anion gap: 13 (ref 5–15)
BUN: 50 mg/dL — ABNORMAL HIGH (ref 6–20)
CO2: 24 mmol/L (ref 22–32)
Calcium: 8.7 mg/dL — ABNORMAL LOW (ref 8.9–10.3)
Chloride: 98 mmol/L (ref 98–111)
Creatinine, Ser: 9.21 mg/dL — ABNORMAL HIGH (ref 0.61–1.24)
GFR calc Af Amer: 7 mL/min — ABNORMAL LOW (ref >60)
GFR calc non Af Amer: 6 mL/min — ABNORMAL LOW (ref >60)
Glucose, Bld: 130 mg/dL — ABNORMAL HIGH (ref 70–99)
Phosphorus: 5.4 mg/dL — ABNORMAL HIGH (ref 2.5–4.6)
Potassium: 5.3 mmol/L — ABNORMAL HIGH (ref 3.5–5.1)
Sodium: 135 mmol/L (ref 135–145)

## 2020-03-13 LAB — CBC
HCT: 29 % — ABNORMAL LOW (ref 39.0–52.0)
Hemoglobin: 8.7 g/dL — ABNORMAL LOW (ref 13.0–17.0)
MCH: 25.7 pg — ABNORMAL LOW (ref 26.0–34.0)
MCHC: 30 g/dL (ref 30.0–36.0)
MCV: 85.5 fL (ref 80.0–100.0)
Platelets: 141 10*3/uL — ABNORMAL LOW (ref 150–400)
RBC: 3.39 MIL/uL — ABNORMAL LOW (ref 4.22–5.81)
RDW: 18.8 % — ABNORMAL HIGH (ref 11.5–15.5)
WBC: 3.7 10*3/uL — ABNORMAL LOW (ref 4.0–10.5)
nRBC: 0 % (ref 0.0–0.2)

## 2020-03-13 MED ORDER — APIXABAN 5 MG PO TABS: 5.0000 mg | ORAL_TABLET | Freq: Two times a day (BID) | ORAL | 0 refills | Status: AC

## 2020-03-13 MED ORDER — HYDROMORPHONE HCL 1 MG/ML IJ SOLN
INTRAMUSCULAR | Status: AC
  Filled 2020-03-13: qty 1

## 2020-03-13 MED ORDER — DARBEPOETIN ALFA 200 MCG/0.4ML IJ SOSY
PREFILLED_SYRINGE | INTRAMUSCULAR | Status: AC
  Filled 2020-03-13: qty 0.4

## 2020-03-13 MED ORDER — HEPARIN SODIUM (PORCINE) 1000 UNIT/ML IJ SOLN
INTRAMUSCULAR | Status: AC
  Filled 2020-03-13: qty 4

## 2020-03-13 MED ORDER — OXYCODONE HCL 5 MG PO TABS
ORAL_TABLET | ORAL | Status: AC
  Filled 2020-03-13: qty 4

## 2020-03-13 MED ORDER — HYDROMORPHONE HCL 1 MG/ML IJ SOLN
1.0000 mg | Freq: Once | INTRAMUSCULAR | Status: AC
  Administered 2020-03-13: 1 mg via INTRAVENOUS

## 2020-03-13 MED ORDER — APIXABAN 5 MG PO TABS: 10.0000 mg | ORAL_TABLET | Freq: Two times a day (BID) | ORAL | 0 refills | Status: DC

## 2020-03-13 NOTE — Progress Notes (Signed)
Patient was stable at discharge. Pt's IV removed. We reviewed the discharge education. Patient verbalized understanding and had no further questions.He is aware of his follow up appointments. Patient left with belongings in hand.

## 2020-03-13 NOTE — Plan of Care (Signed)
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
## 2020-03-13 NOTE — Plan of Care (Signed)
  Problem: Education: Goal: Knowledge of General Education information will improve Description: Including pain rating scale, medication(s)/side effects and non-pharmacologic comfort measures Outcome: Adequate for Discharge

## 2020-03-13 NOTE — Discharge Instructions (Signed)
Dear [name],   Thank you so much for allowing [place] to be part of your care!  You were admitted to [facility] for pulmonary embolism and treated by being restarted on Eliquis.  You were also admitted for shortness of breath you received oxygen and hemodialysis as increased fluid was noted to be on your lungs and heart.   POST-HOSPITAL & CARE INSTRUCTIONS 1. Please attend and complete hemodialysis sessions. 2. Please let PCP/Specialists know of any changes that were made.  3. Please see medications section of this packet for any medication changes.   DOCTOR'S APPOINTMENT & FOLLOW UP CARE INSTRUCTIONS  Future Appointments  Date Time Provider [place]  05/11/2020 11:00 AM [place], Birdena Jubilee, LCSW [facility] None  Address of this behavioral health/therapist appointment is [address] [place], [place] [address].  Take care and be well!  [facility]  [place], [place] [address] [phone]

## 2020-03-13 NOTE — Social Work (Signed)
Received verbal consult from Dr. Candie Chroman-  F/u appointments made with [facility] (PCP) on August 31st at 3:30pm. Additional supportive appointment made at [facility] at 11am on 10/21.   Both added to AVS for pt along with numbers and instructions to cancel if he cannot make them.   Westley Hummer, MSW, [place] Work

## 2020-03-13 NOTE — Progress Notes (Signed)
Discharge instructions (including medications) discussed with and copy provided to patient/caregiver 

## 2020-03-14 ENCOUNTER — Telehealth: Payer: Self-pay | Admitting: Nurse Practitioner

## 2020-03-14 NOTE — Telephone Encounter (Signed)
Transition of care contact from inpatient facility  Date of Discharge: 03/13/2020 Date of Contact: 03/14/2020 Method of contact: Phone  Attempted to contact patient to discuss transition of care from inpatient admission. Patient did not answer the phone. Message was left on the patient's voicemail with call back number [phone].
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
## 2020-03-16 ENCOUNTER — Telehealth (HOSPITAL_COMMUNITY): Payer: Self-pay | Admitting: Nephrology

## 2020-03-16 NOTE — Telephone Encounter (Signed)
Transition of care contact from inpatient facility  Date of Discharge: 03/13/2020 Date of Contact: 03/16/2020 Method of contact: Phone  Attempted to contact patient to discuss transition of care from inpatient admission. Patient did not answer the phone. Message was left on the patient's voicemail with call back number [phone]

## 2020-03-23 ENCOUNTER — Other Ambulatory Visit: Payer: Self-pay

## 2020-03-23 ENCOUNTER — Emergency Department (HOSPITAL_COMMUNITY)
Admission: EM | Admit: 2020-03-23 | Discharge: 2020-03-23 | Disposition: A | Discharge status: Home / Self Care | Payer: Medicare Other | Attending: Emergency Medicine | Admitting: Emergency Medicine

## 2020-03-23 DIAGNOSIS — R0602 Shortness of breath: Secondary | ICD-10-CM | POA: Diagnosis not present

## 2020-03-23 DIAGNOSIS — I10 Essential (primary) hypertension: Secondary | ICD-10-CM | POA: Diagnosis not present

## 2020-03-23 DIAGNOSIS — Z5321 Procedure and treatment not carried out due to patient leaving prior to being seen by health care provider: Secondary | ICD-10-CM | POA: Diagnosis not present

## 2020-03-23 LAB — CBG MONITORING, ED: Glucose-Capillary: 72 mg/dL (ref 70–99)

## 2020-03-23 NOTE — ED Triage Notes (Signed)
Pt reports feeling sob and is hypertensive. Last dialysis was yesterday. bp 200/113 at triage, states he did not take his meds this am and is requesting his clonidine at triage. No resp distress is noted.

## 2020-03-23 NOTE — ED Notes (Signed)
Pt reports he only wanted his bp checked, he will go home and take his bp meds. Encouraged to stay but he refused. Instructed to return if needed.

## 2020-03-26 ENCOUNTER — Emergency Department (HOSPITAL_BASED_OUTPATIENT_CLINIC_OR_DEPARTMENT_OTHER)
Admission: EM | Admit: 2020-03-26 | Discharge: 2020-03-26 | Disposition: A | Discharge status: Home / Self Care | Payer: Medicare Other | Attending: Emergency Medicine | Admitting: Emergency Medicine

## 2020-03-26 ENCOUNTER — Encounter (HOSPITAL_BASED_OUTPATIENT_CLINIC_OR_DEPARTMENT_OTHER): Payer: Self-pay | Admitting: *Deleted

## 2020-03-26 ENCOUNTER — Other Ambulatory Visit: Payer: Self-pay

## 2020-03-26 DIAGNOSIS — R109 Unspecified abdominal pain: Secondary | ICD-10-CM

## 2020-03-26 DIAGNOSIS — I132 Hypertensive heart and chronic kidney disease with heart failure and with stage 5 chronic kidney disease, or end stage renal disease: Secondary | ICD-10-CM | POA: Insufficient documentation

## 2020-03-26 DIAGNOSIS — Z79899 Other long term (current) drug therapy: Secondary | ICD-10-CM | POA: Insufficient documentation

## 2020-03-26 DIAGNOSIS — R112 Nausea with vomiting, unspecified: Secondary | ICD-10-CM | POA: Diagnosis not present

## 2020-03-26 DIAGNOSIS — Z7901 Long term (current) use of anticoagulants: Secondary | ICD-10-CM | POA: Insufficient documentation

## 2020-03-26 DIAGNOSIS — I5042 Chronic combined systolic (congestive) and diastolic (congestive) heart failure: Secondary | ICD-10-CM | POA: Diagnosis not present

## 2020-03-26 DIAGNOSIS — Z992 Dependence on renal dialysis: Secondary | ICD-10-CM | POA: Diagnosis not present

## 2020-03-26 DIAGNOSIS — G8929 Other chronic pain: Secondary | ICD-10-CM | POA: Insufficient documentation

## 2020-03-26 DIAGNOSIS — Z87891 Personal history of nicotine dependence: Secondary | ICD-10-CM | POA: Diagnosis not present

## 2020-03-26 DIAGNOSIS — R1013 Epigastric pain: Secondary | ICD-10-CM | POA: Insufficient documentation

## 2020-03-26 DIAGNOSIS — N186 End stage renal disease: Secondary | ICD-10-CM | POA: Insufficient documentation

## 2020-03-26 DIAGNOSIS — J441 Chronic obstructive pulmonary disease with (acute) exacerbation: Secondary | ICD-10-CM | POA: Insufficient documentation

## 2020-03-26 LAB — CBC WITH DIFFERENTIAL/PLATELET
Abs Immature Granulocytes: 0.01 10*3/uL (ref 0.00–0.07)
Basophils Absolute: 0 10*3/uL (ref 0.0–0.1)
Basophils Relative: 1 %
Eosinophils Absolute: 0.2 10*3/uL (ref 0.0–0.5)
Eosinophils Relative: 6 %
HCT: 32.7 % — ABNORMAL LOW (ref 39.0–52.0)
Hemoglobin: 10 g/dL — ABNORMAL LOW (ref 13.0–17.0)
Immature Granulocytes: 0 %
Lymphocytes Relative: 16 %
Lymphs Abs: 0.5 10*3/uL — ABNORMAL LOW (ref 0.7–4.0)
MCH: 25.6 pg — ABNORMAL LOW (ref 26.0–34.0)
MCHC: 30.6 g/dL (ref 30.0–36.0)
MCV: 83.6 fL (ref 80.0–100.0)
Monocytes Absolute: 0.4 10*3/uL (ref 0.1–1.0)
Monocytes Relative: 16 %
Neutro Abs: 1.7 10*3/uL (ref 1.7–7.7)
Neutrophils Relative %: 61 %
Platelets: 128 10*3/uL — ABNORMAL LOW (ref 150–400)
RBC: 3.91 MIL/uL — ABNORMAL LOW (ref 4.22–5.81)
RDW: 19.2 % — ABNORMAL HIGH (ref 11.5–15.5)
WBC: 2.8 10*3/uL — ABNORMAL LOW (ref 4.0–10.5)
nRBC: 0 % (ref 0.0–0.2)

## 2020-03-26 LAB — COMPREHENSIVE METABOLIC PANEL
ALT: 11 U/L (ref 0–44)
AST: 18 U/L (ref 15–41)
Albumin: 3.4 g/dL — ABNORMAL LOW (ref 3.5–5.0)
Alkaline Phosphatase: 210 U/L — ABNORMAL HIGH (ref 38–126)
Anion gap: 15 (ref 5–15)
BUN: 45 mg/dL — ABNORMAL HIGH (ref 6–20)
CO2: 28 mmol/L (ref 22–32)
Calcium: 8.7 mg/dL — ABNORMAL LOW (ref 8.9–10.3)
Chloride: 95 mmol/L — ABNORMAL LOW (ref 98–111)
Creatinine, Ser: 11.45 mg/dL — ABNORMAL HIGH (ref 0.61–1.24)
GFR calc Af Amer: 5 mL/min — ABNORMAL LOW (ref >60)
GFR calc non Af Amer: 4 mL/min — ABNORMAL LOW (ref >60)
Glucose, Bld: 72 mg/dL (ref 70–99)
Potassium: 4.1 mmol/L (ref 3.5–5.1)
Sodium: 138 mmol/L (ref 135–145)
Total Bilirubin: 0.7 mg/dL (ref 0.3–1.2)
Total Protein: 8.1 g/dL (ref 6.5–8.1)

## 2020-03-26 LAB — LIPASE, BLOOD: Lipase: 28 U/L (ref 11–51)

## 2020-03-26 MED ORDER — OXYCODONE HCL 5 MG PO TABS: 30.0000 mg | ORAL_TABLET | Freq: Once | ORAL | Status: DC

## 2020-03-26 MED ORDER — OXYCODONE HCL 5 MG PO TABS
15.0000 mg | ORAL_TABLET | Freq: Once | ORAL | Status: AC
  Administered 2020-03-26: 15 mg via ORAL
  Filled 2020-03-26: qty 3

## 2020-03-26 MED ORDER — PROMETHAZINE HCL 25 MG/ML IJ SOLN
25.0000 mg | Freq: Once | INTRAMUSCULAR | Status: AC
  Administered 2020-03-26: 25 mg via INTRAVENOUS

## 2020-03-26 MED ORDER — PROMETHAZINE HCL 25 MG/ML IJ SOLN
25.0000 mg | Freq: Once | INTRAMUSCULAR | Status: DC
  Filled 2020-03-26: qty 1
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
## 2020-03-26 NOTE — ED Triage Notes (Addendum)
Pt c/o generalized abd pain and c/o right back pain. Also c/o of vomiting "a lot"  Has not taken any meds at home. Pt has dialysis on M-W-F. Pt has a shunt to his left arm.

## 2020-03-27 ENCOUNTER — Other Ambulatory Visit: Payer: Self-pay

## 2020-03-27 ENCOUNTER — Encounter (HOSPITAL_COMMUNITY): Payer: Self-pay

## 2020-03-27 ENCOUNTER — Emergency Department (HOSPITAL_COMMUNITY)
Admission: EM | Admit: 2020-03-27 | Discharge: 2020-03-28 | Disposition: A | Discharge status: Home / Self Care | Payer: Medicare Other | Attending: Emergency Medicine | Admitting: Emergency Medicine

## 2020-03-27 DIAGNOSIS — R112 Nausea with vomiting, unspecified: Secondary | ICD-10-CM | POA: Insufficient documentation

## 2020-03-27 DIAGNOSIS — K219 Gastro-esophageal reflux disease without esophagitis: Secondary | ICD-10-CM | POA: Insufficient documentation

## 2020-03-27 DIAGNOSIS — Z87891 Personal history of nicotine dependence: Secondary | ICD-10-CM | POA: Insufficient documentation

## 2020-03-27 DIAGNOSIS — G8929 Other chronic pain: Secondary | ICD-10-CM | POA: Insufficient documentation

## 2020-03-27 DIAGNOSIS — I5042 Chronic combined systolic (congestive) and diastolic (congestive) heart failure: Secondary | ICD-10-CM | POA: Insufficient documentation

## 2020-03-27 DIAGNOSIS — Z79899 Other long term (current) drug therapy: Secondary | ICD-10-CM | POA: Diagnosis not present

## 2020-03-27 DIAGNOSIS — I132 Hypertensive heart and chronic kidney disease with heart failure and with stage 5 chronic kidney disease, or end stage renal disease: Secondary | ICD-10-CM | POA: Diagnosis not present

## 2020-03-27 DIAGNOSIS — J449 Chronic obstructive pulmonary disease, unspecified: Secondary | ICD-10-CM | POA: Insufficient documentation

## 2020-03-27 DIAGNOSIS — N186 End stage renal disease: Secondary | ICD-10-CM | POA: Insufficient documentation

## 2020-03-27 DIAGNOSIS — Z7901 Long term (current) use of anticoagulants: Secondary | ICD-10-CM | POA: Diagnosis not present

## 2020-03-27 DIAGNOSIS — R1010 Upper abdominal pain, unspecified: Secondary | ICD-10-CM | POA: Diagnosis present

## 2020-03-27 NOTE — ED Notes (Addendum)
Patient in triage verbally aggressive to staff members about the location of the blood pressure cuff, refusing to lift his pants leg and argumentative on location on leg. Patient asked for a blanket which staff provided but patient states he cannot cover himself up because "its different at the hospital" and needs staff to do it for him. When patient was covered and comfortable patient states "I hope shes not my tech she has a bad attitude."

## 2020-03-27 NOTE — ED Triage Notes (Signed)
Patient arrived stating that he started having a "pancreatits flare up"  yesterday. Reports generalized abdominal pain, vomiting and upper back pain. Reports taking home medication with no relief.
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
## 2020-03-27 NOTE — ED Notes (Signed)
Patient requesting lab draw when "given a room in the back."

## 2020-03-28 ENCOUNTER — Encounter (HOSPITAL_COMMUNITY): Payer: Self-pay | Admitting: General Practice

## 2020-03-28 DIAGNOSIS — R1010 Upper abdominal pain, unspecified: Secondary | ICD-10-CM | POA: Diagnosis not present

## 2020-03-28 LAB — COMPREHENSIVE METABOLIC PANEL
ALT: 8 U/L (ref 0–44)
AST: 13 U/L — ABNORMAL LOW (ref 15–41)
Albumin: 3.4 g/dL — ABNORMAL LOW (ref 3.5–5.0)
Alkaline Phosphatase: 205 U/L — ABNORMAL HIGH (ref 38–126)
Anion gap: 19 — ABNORMAL HIGH (ref 5–15)
BUN: 56 mg/dL — ABNORMAL HIGH (ref 6–20)
CO2: 25 mmol/L (ref 22–32)
Calcium: 9.1 mg/dL (ref 8.9–10.3)
Chloride: 97 mmol/L — ABNORMAL LOW (ref 98–111)
Creatinine, Ser: 14.55 mg/dL — ABNORMAL HIGH (ref 0.61–1.24)
GFR calc Af Amer: 4 mL/min — ABNORMAL LOW (ref >60)
GFR calc non Af Amer: 3 mL/min — ABNORMAL LOW (ref >60)
Glucose, Bld: 78 mg/dL (ref 70–99)
Potassium: 4.5 mmol/L (ref 3.5–5.1)
Sodium: 141 mmol/L (ref 135–145)
Total Bilirubin: 1.2 mg/dL (ref 0.3–1.2)
Total Protein: 8 g/dL (ref 6.5–8.1)

## 2020-03-28 LAB — CBC
HCT: 32.2 % — ABNORMAL LOW (ref 39.0–52.0)
Hemoglobin: 10 g/dL — ABNORMAL LOW (ref 13.0–17.0)
MCH: 26 pg (ref 26.0–34.0)
MCHC: 31.1 g/dL (ref 30.0–36.0)
MCV: 83.9 fL (ref 80.0–100.0)
Platelets: 139 10*3/uL — ABNORMAL LOW (ref 150–400)
RBC: 3.84 MIL/uL — ABNORMAL LOW (ref 4.22–5.81)
RDW: 19.1 % — ABNORMAL HIGH (ref 11.5–15.5)
WBC: 3.7 10*3/uL — ABNORMAL LOW (ref 4.0–10.5)
nRBC: 0 % (ref 0.0–0.2)

## 2020-03-28 LAB — LIPASE, BLOOD: Lipase: 32 U/L (ref 11–51)

## 2020-03-28 MED ORDER — ALUM & MAG HYDROXIDE-SIMETH 200-200-20 MG/5ML PO SUSP
30.0000 mL | Freq: Once | ORAL | Status: AC
  Administered 2020-03-28: 30 mL via ORAL
  Filled 2020-03-28: qty 30

## 2020-03-28 MED ORDER — KETOROLAC TROMETHAMINE 30 MG/ML IJ SOLN
30.0000 mg | Freq: Once | INTRAMUSCULAR | Status: AC
  Administered 2020-03-28: 30 mg via INTRAMUSCULAR
  Filled 2020-03-28: qty 1

## 2020-03-28 NOTE — Discharge Instructions (Signed)
Please follow up with your primary doctor within the next 5-7 days.  If you do not have a primary care provider, information for a healthcare clinic has been provided for you to make arrangements for follow up care. Please return to the ER sooner if you have any new or worsening symptoms, or if you have any of the following symptoms:  Abdominal pain that does not go away.  You have a fever.  You keep throwing up (vomiting).  The pain is felt only in portions of the abdomen. Pain in the right side could possibly be appendicitis. In an adult, pain in the left lower portion of the abdomen could be colitis or diverticulitis.  You pass bloody or black tarry stools.  There is bright red blood in the stool.  The constipation stays for more than 4 days.  There is belly (abdominal) or rectal pain.  You do not seem to be getting better.  You have any questions or concerns. '

## 2020-03-28 NOTE — ED Notes (Signed)
Pt tolerated PO Maalox with no N/V. Pt currently sleeping with no complaints at this time.
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
## 2020-03-29 IMAGING — CR DG CHEST 2V
2 series · 2 of 2 positions shown · non-contrast
Comparison: 09/21/2017

CLINICAL DATA: Chest pain, shortness of breath

EXAM:
CHEST - 2 VIEW

[chest lat]
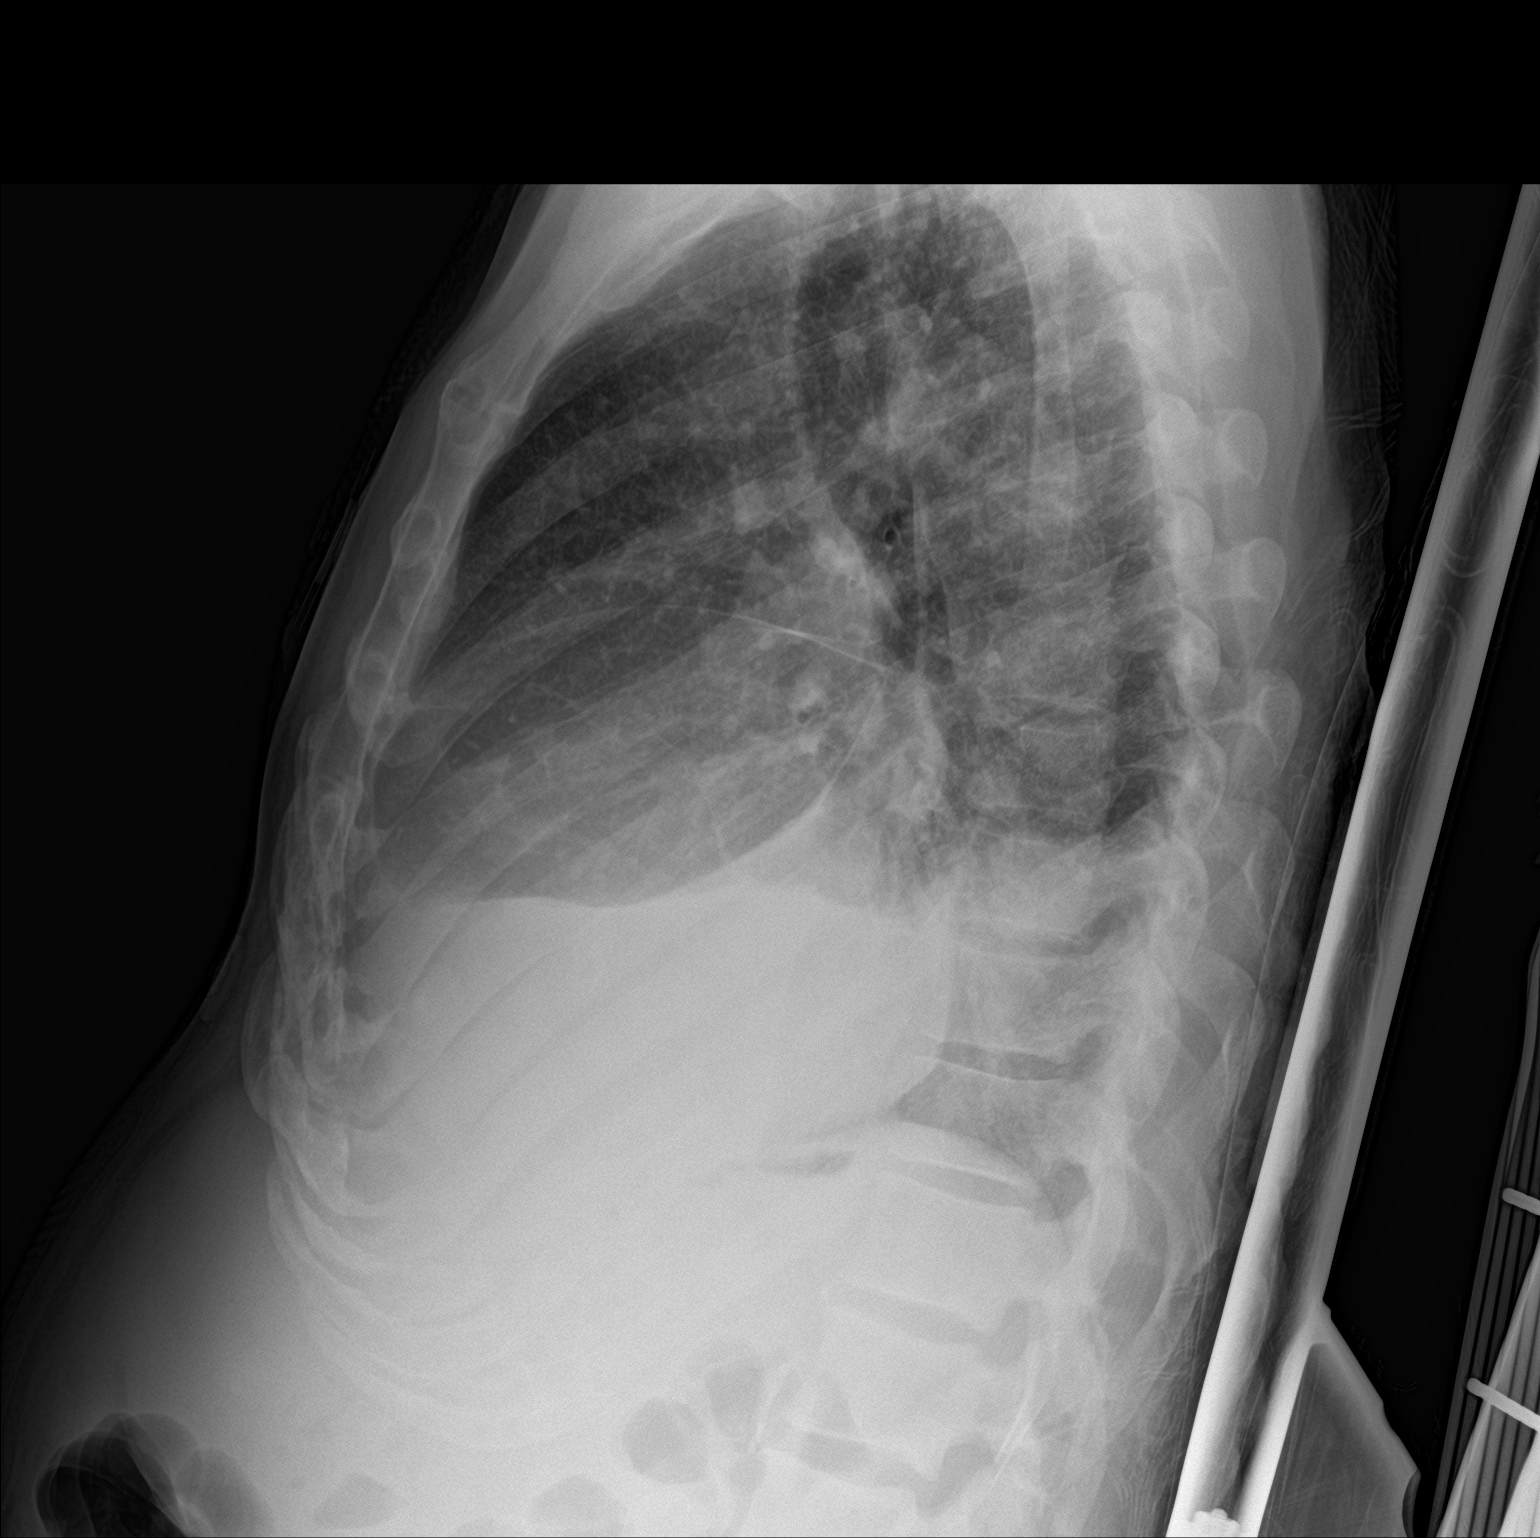

[chest ap]
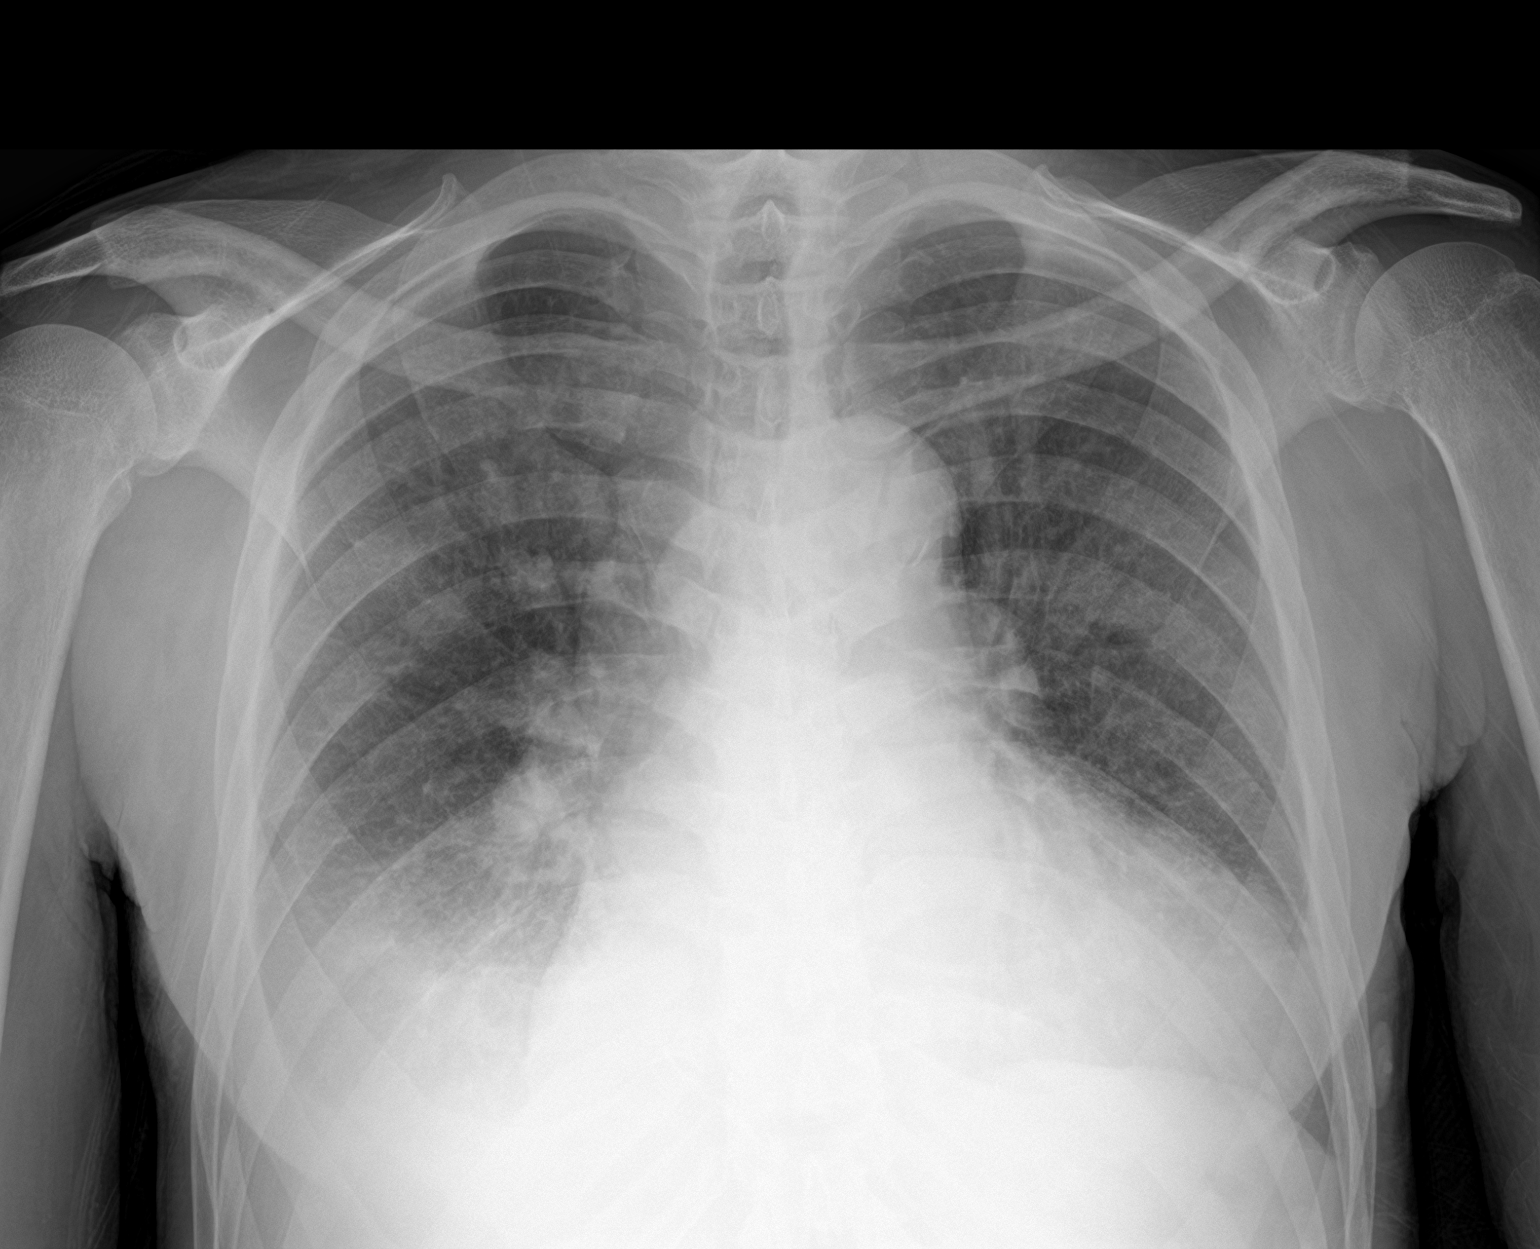

[2 of 2 positions shown; findings below may reference images not displayed]

FINDINGS: Cardiomegaly with vascular congestion and interstitial prominence,
likely interstitial edema. More confluent opacity noted at the right
lower lung. Moderate layering right effusion. No definite left
effusion. No acute bony abnormality.
IMPRESSION: Cardiomegaly with vascular congestion, suspect interstitial edema.

Moderate layering right effusion with right lower lobe airspace
opacity concerning for pneumonia.

## 2020-03-30 IMAGING — CT CT ANGIO CHEST
2 of 7 series · 17 of 46 positions shown · IV contrast (APPLIED)
Comparison: Chest radiographs dated 11/08/2017. Abdomen and pelvis
CT dated 09/22/2017 and chest CT dated 06/24/2017.

CLINICAL DATA: Right chest pain and nonproductive cough. Chronic
dialysis patient. Ex-smoker. Diabetes.

EXAM:
CT ANGIOGRAPHY CHEST WITH CONTRAST
TECHNIQUE: Multidetector CT imaging of the chest was performed using the
standard protocol during bolus administration of intravenous
contrast. Multiplanar CT image reconstructions and MIPs were
obtained to evaluate the vascular anatomy.
CONTRAST:  80mL P0E0PS-CC5 IOPAMIDOL (P0E0PS-CC5) INJECTION 76%

[Series 7: thins · axial · 0.74mm/px · z∈[+1260,+1550]mm · 14 of 466 slices shown]
[im 26/466  lung]
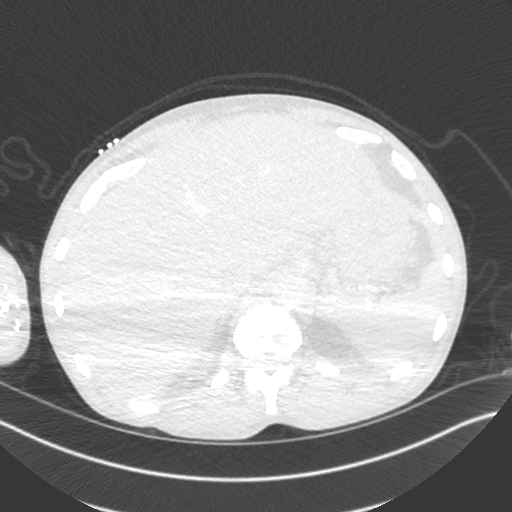
[im 52/466  soft-tissue]
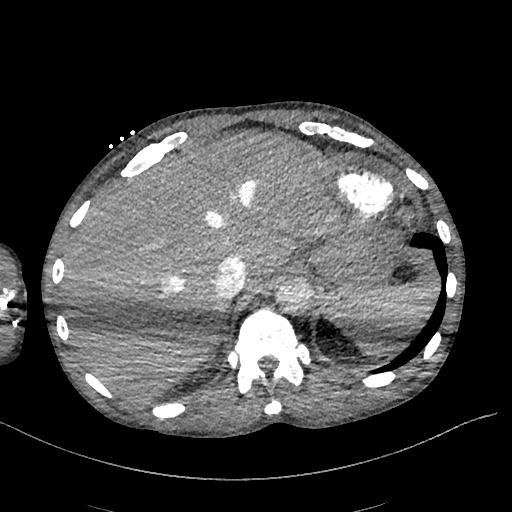
[im 104/466  lung]
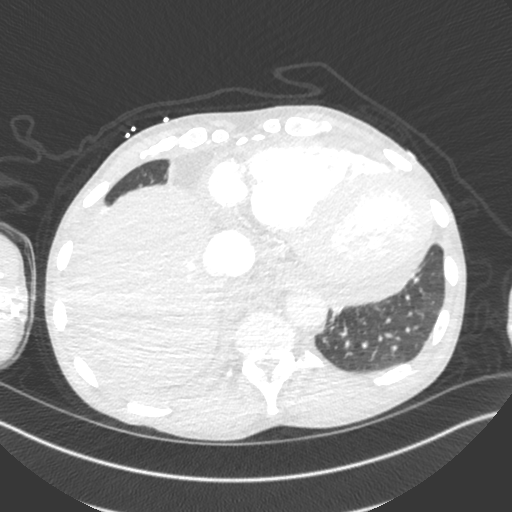
[im 130/466  soft-tissue]
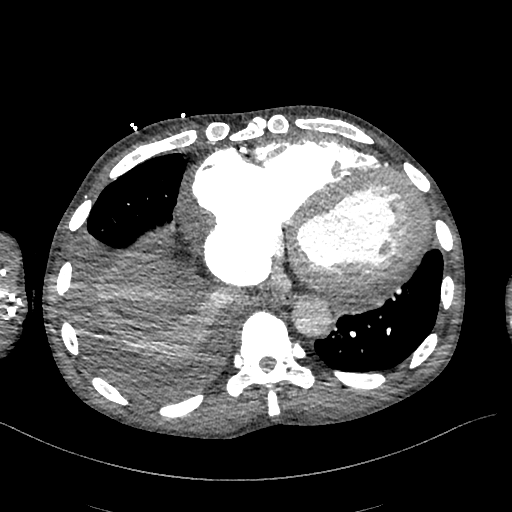
[im 156/466  lung]
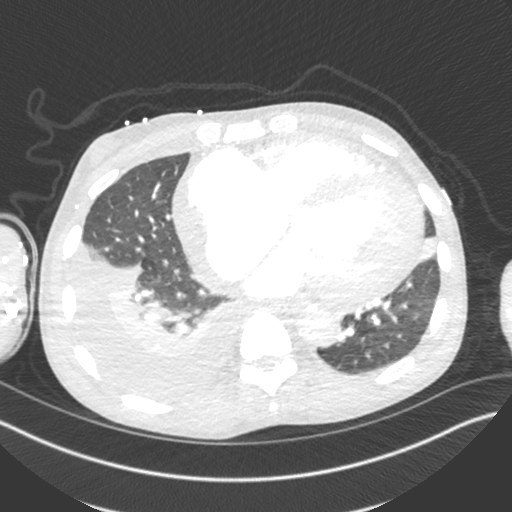
[im 181/466  soft-tissue]
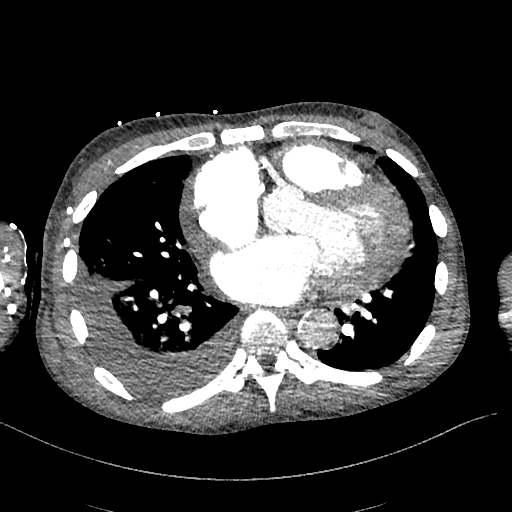
[im 207/466  lung]
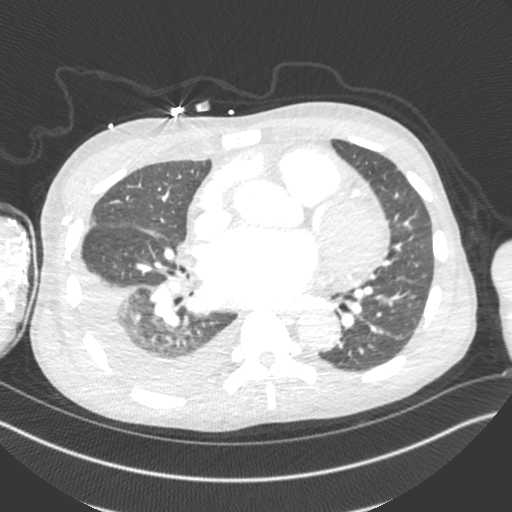
[im 259/466  soft-tissue]
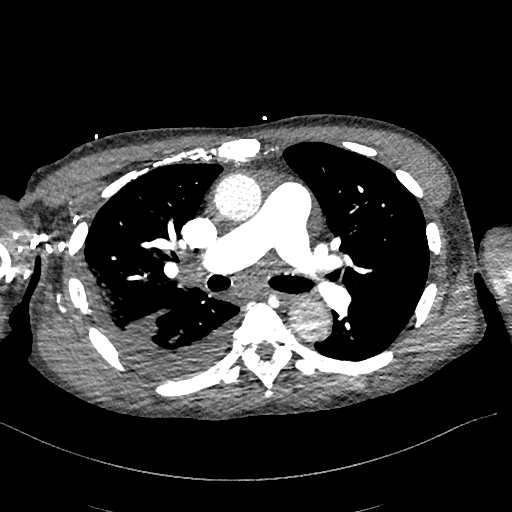
[im 285/466  lung]
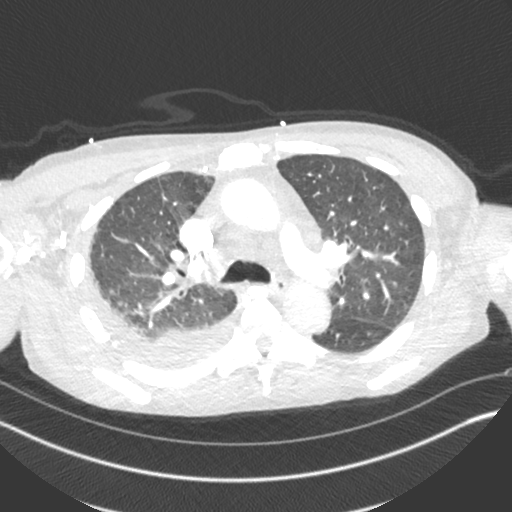
[im 311/466  soft-tissue]
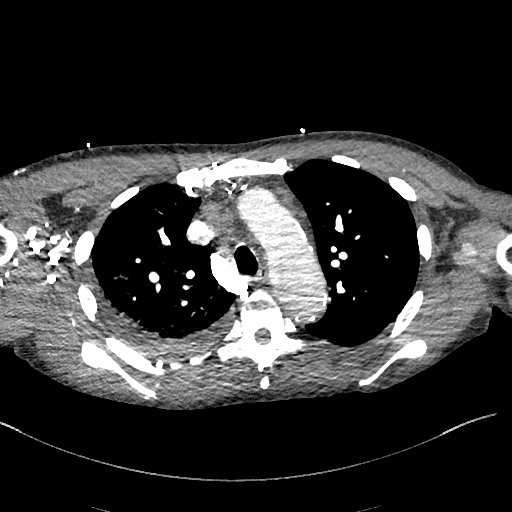
[im 336/466  lung]
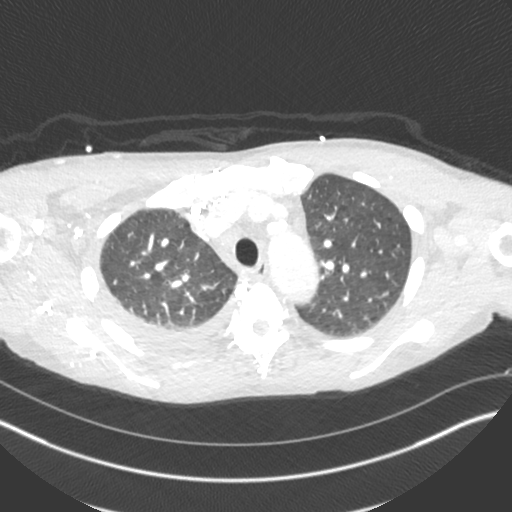
[im 362/466  soft-tissue]
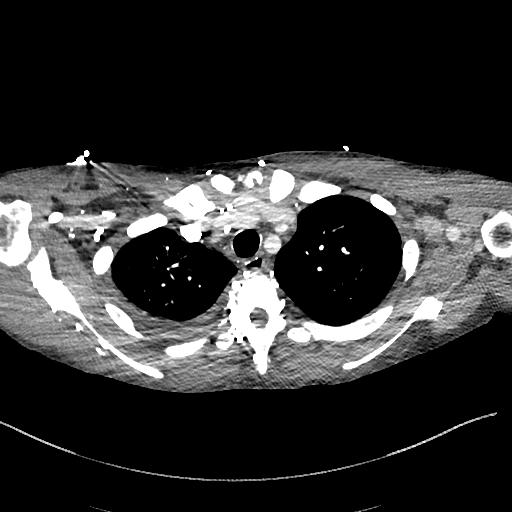
[im 414/466  lung]
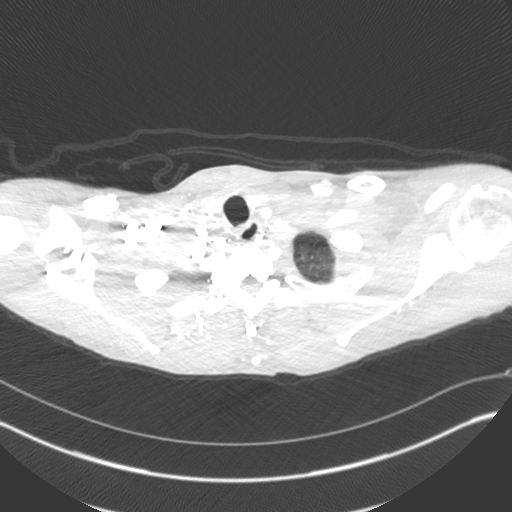
[im 440/466  soft-tissue]
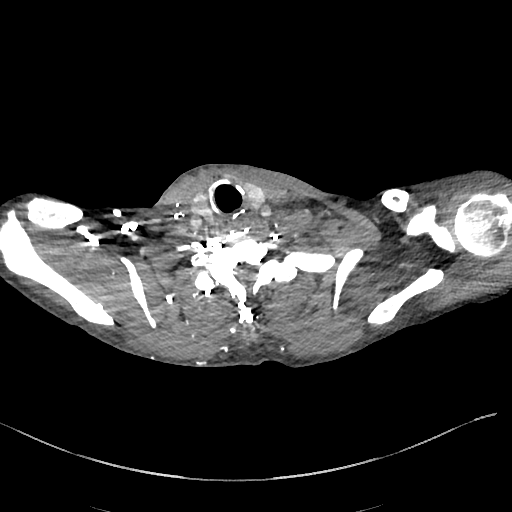

[Series 8: cor · coronal · 0.62mm/px · 3 of 173 slices shown]
[im 44/173  soft-tissue]
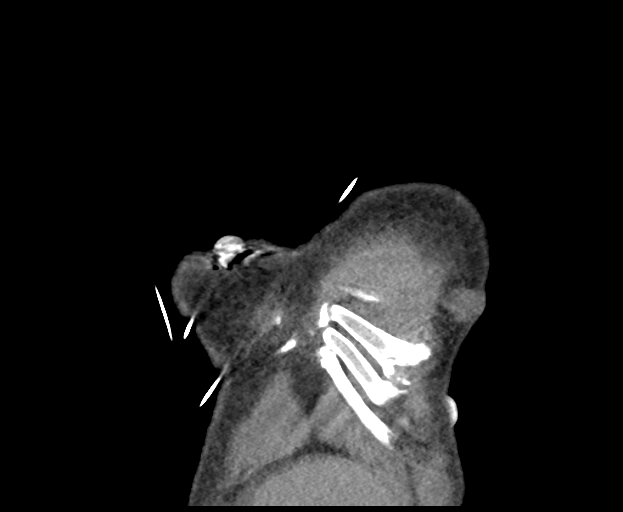
[im 87/173  soft-tissue]
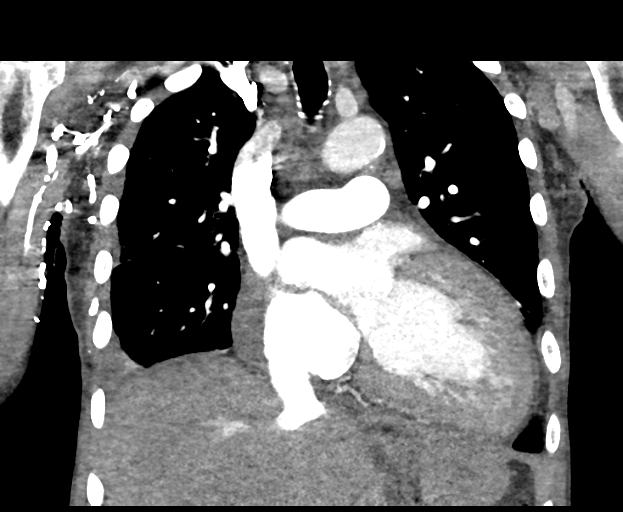
[im 130/173  soft-tissue]
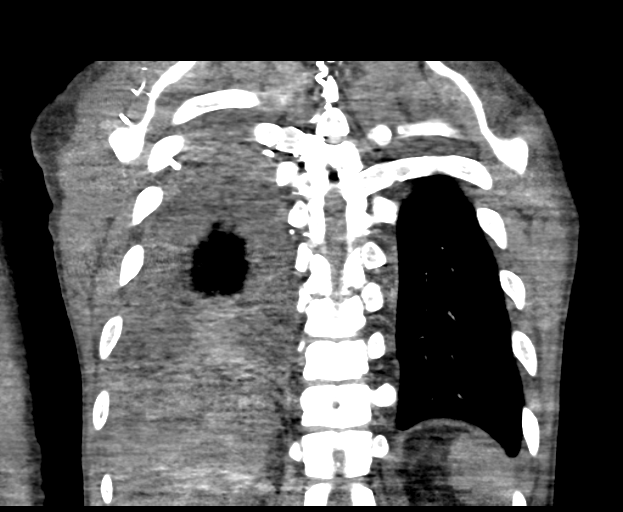

[17 of 46 positions shown; findings below may reference images not displayed]

FINDINGS: Cardiovascular: Normally opacified pulmonary arteries with no
pulmonary arterial filling defects. Atheromatous calcifications,
including the coronary arteries and aorta. Enlarged heart with
marked biatrial enlargement, specially the right atrium. Reflux of
contrast into a dilated inferior vena cava and hepatic veins.
Pericardial effusion with a maximum thickness of 1.9 cm.

Mediastinum/Nodes: Multiple enlarged mediastinal and bilateral hilar
lymph nodes. These include an AP window lymph node with a short axis
diameter of 16 mm on image number 63 of series 5, subcarinal node
with a short axis diameter of 20 mm on image number 75 series 5,
right hilar node with a short axis diameter of 18 mm on image number
74 of series 5 and left hilar node with a short axis diameter of 11
mm on image number 81 of series 5.

The included portion of the thyroid gland is unremarkable. No
visible esophageal abnormalities.

Lungs/Pleura: Small to moderate-sized right pleural effusion.
Compressive atelectasis of the adjacent right lower lobe. There is
also additional patchy opacity in posterolateral aspect of the right
upper lobe and right lower lobe.

Minimal bilateral bullous changes.

Upper Abdomen: The lateral segment of the left lobe of the liver and
caudate lobe are enlarged. Normal sized spleen. Interval partially
included curvilinear calcific density at the medial aspect of the
stomach on the last image, measuring 2.5 cm in maximum diameter. On
the scout image, there is a corresponding figure-eight shaped object
at that location.

Musculoskeletal: The bones are diffusely dense..

Review of the MIP images confirms the above findings.
IMPRESSION: 1. No pulmonary emboli.
2. Cardiomegaly with changes of right heart failure.
3. Small to moderate-sized right pleural effusion with associated
compressive atelectasis of the right lower lobe.
4. Possible pneumonia in the posterolateral aspects of the right
upper lobe and right lower lobe.
5. Minimal changes of COPD with centrilobular emphysema.
6.  Calcific coronary artery and aortic atherosclerosis.
7. Interval 2.5 cm curvilinear calcific density at the medial aspect
of the mid stomach with a corresponding figure 8 shaped object on
the scout image, not included in its entirety on the axial images.
This is of unknown origin and significance. This could be ingested
or iatrogenic.
8. Changes of renal osteodystrophy.
9. No significant change in mediastinal and bilateral hilar
adenopathy, possibly reactive.

Aortic Atherosclerosis (1EPH3-GX3.3) and Emphysema (1EPH3-PS6.T).

## 2020-03-31 IMAGING — DX DG CHEST 1V PORT
1 series · 1 of 1 positions shown · non-contrast
Comparison: PA and lateral chest 11/08/2017.  CT chest 11/09/2017.

CLINICAL DATA: Status post central line placement.

EXAM:
PORTABLE CHEST 1 VIEW

[chest ap]
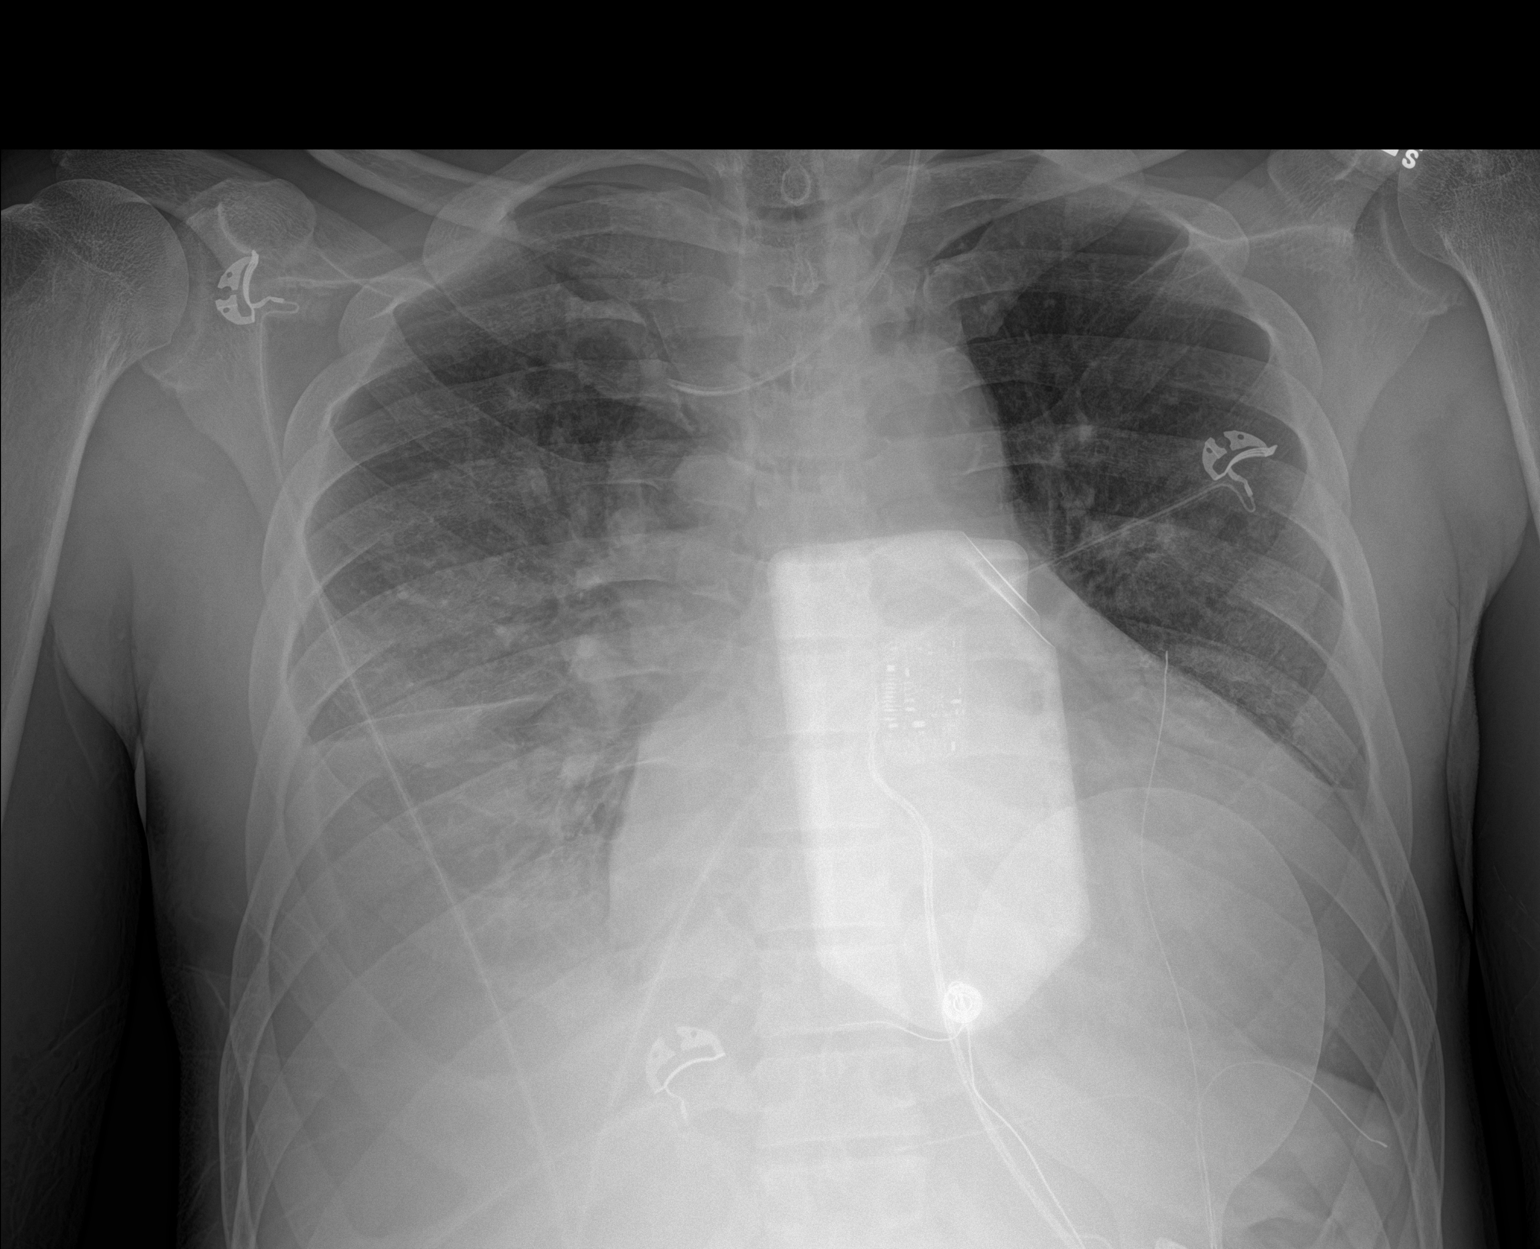

[1 of 1 positions shown; findings below may reference images not displayed]

FINDINGS: New left IJ approach central venous catheter is seen with the tip at
the origin of the superior vena cava at the junction of the
brachiocephalic veins. Marked cardiomegaly and bilateral pleural
effusions, larger on the right, persist. No pneumothorax.
IMPRESSION: Left IJ catheter tip projects at the origin of the superior vena
cava. Negative for pneumothorax.

Right worse than left pleural effusions.

Marked cardiomegaly.

## 2020-04-03 IMAGING — DX DG CHEST 2V
2 series · 2 of 2 positions shown · non-contrast
Comparison: 11/10/2017

CLINICAL DATA: Left-sided chest pain x1 day. Patient is on
dialysis. End-stage renal disease.

EXAM:
CHEST - 2 VIEW

[chest lat]
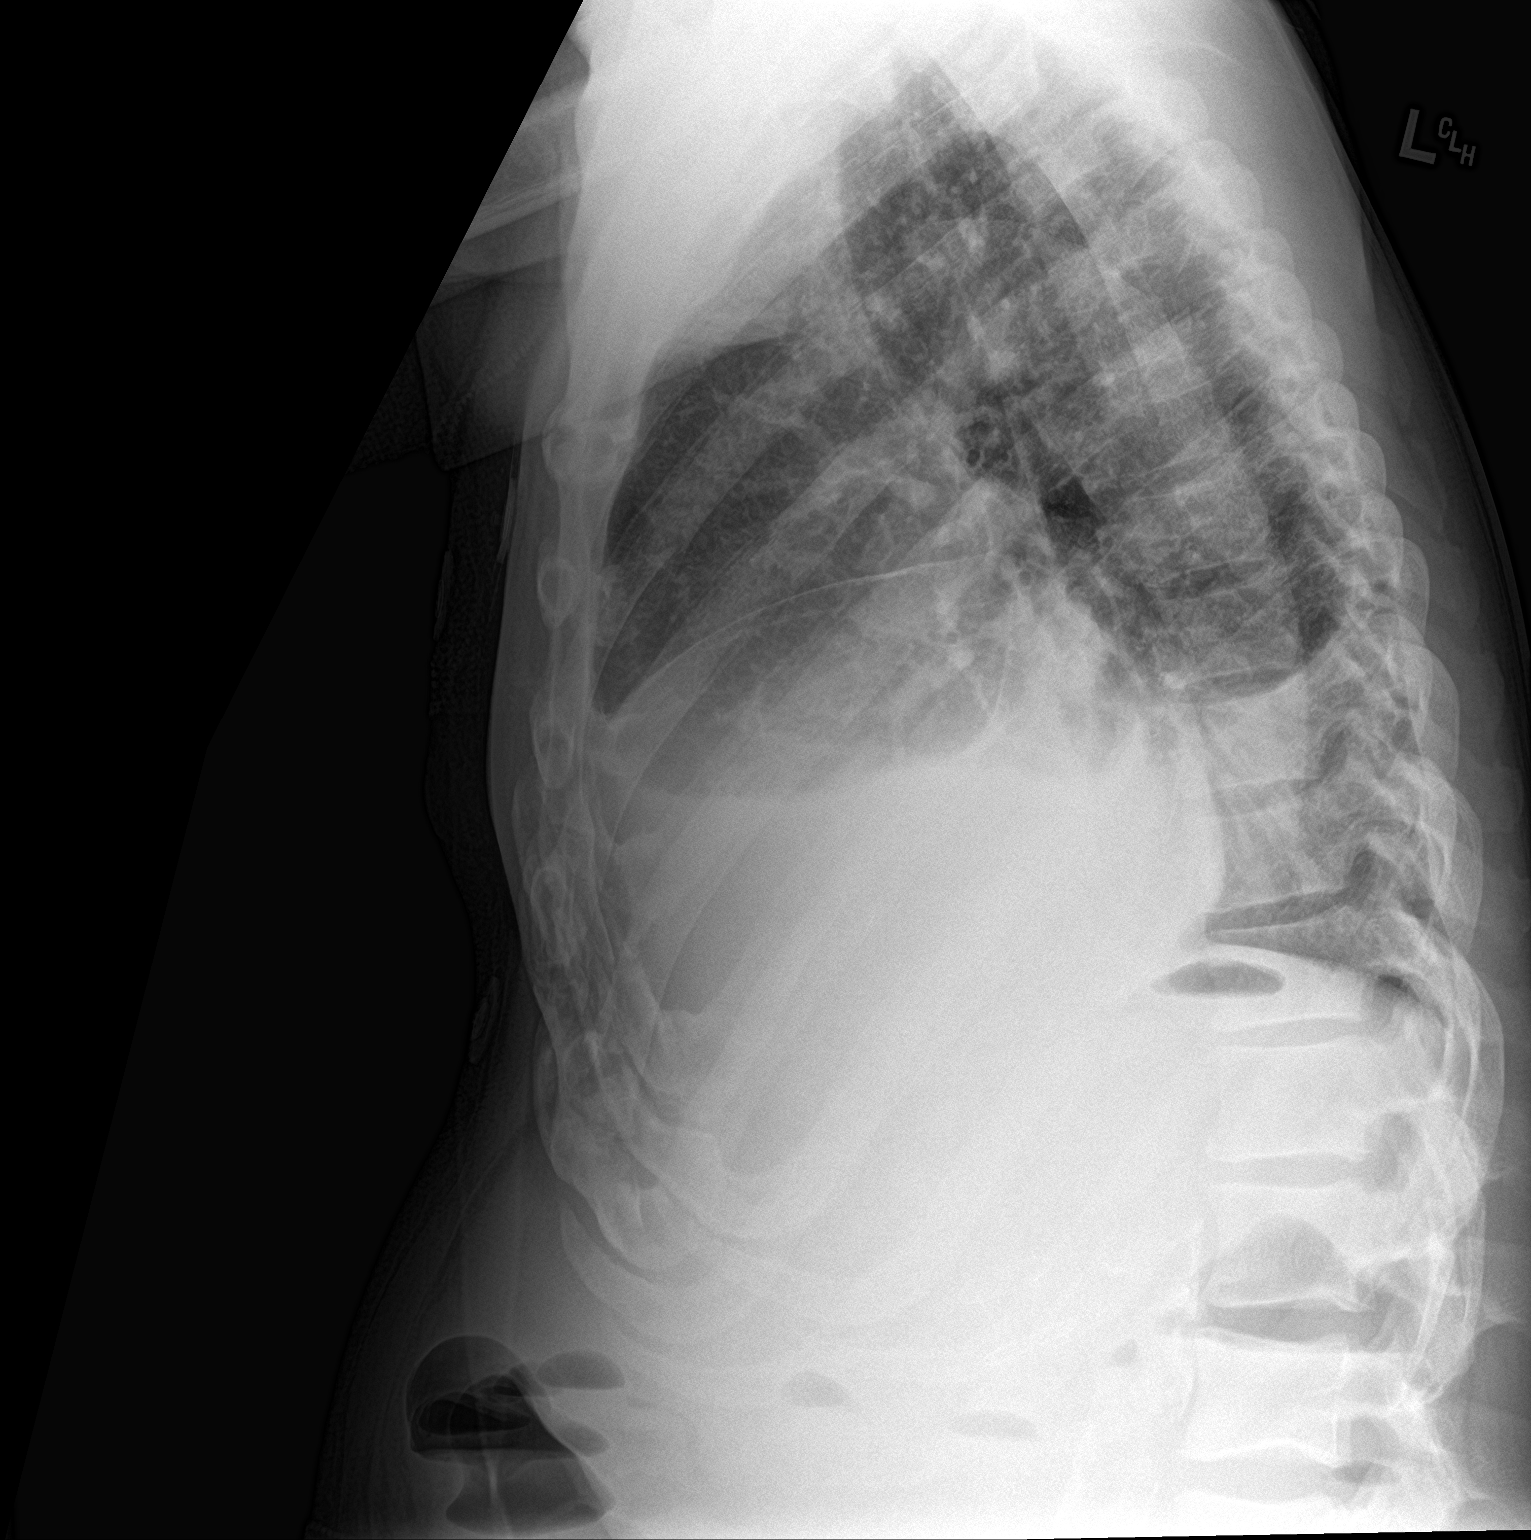

[chest ap]
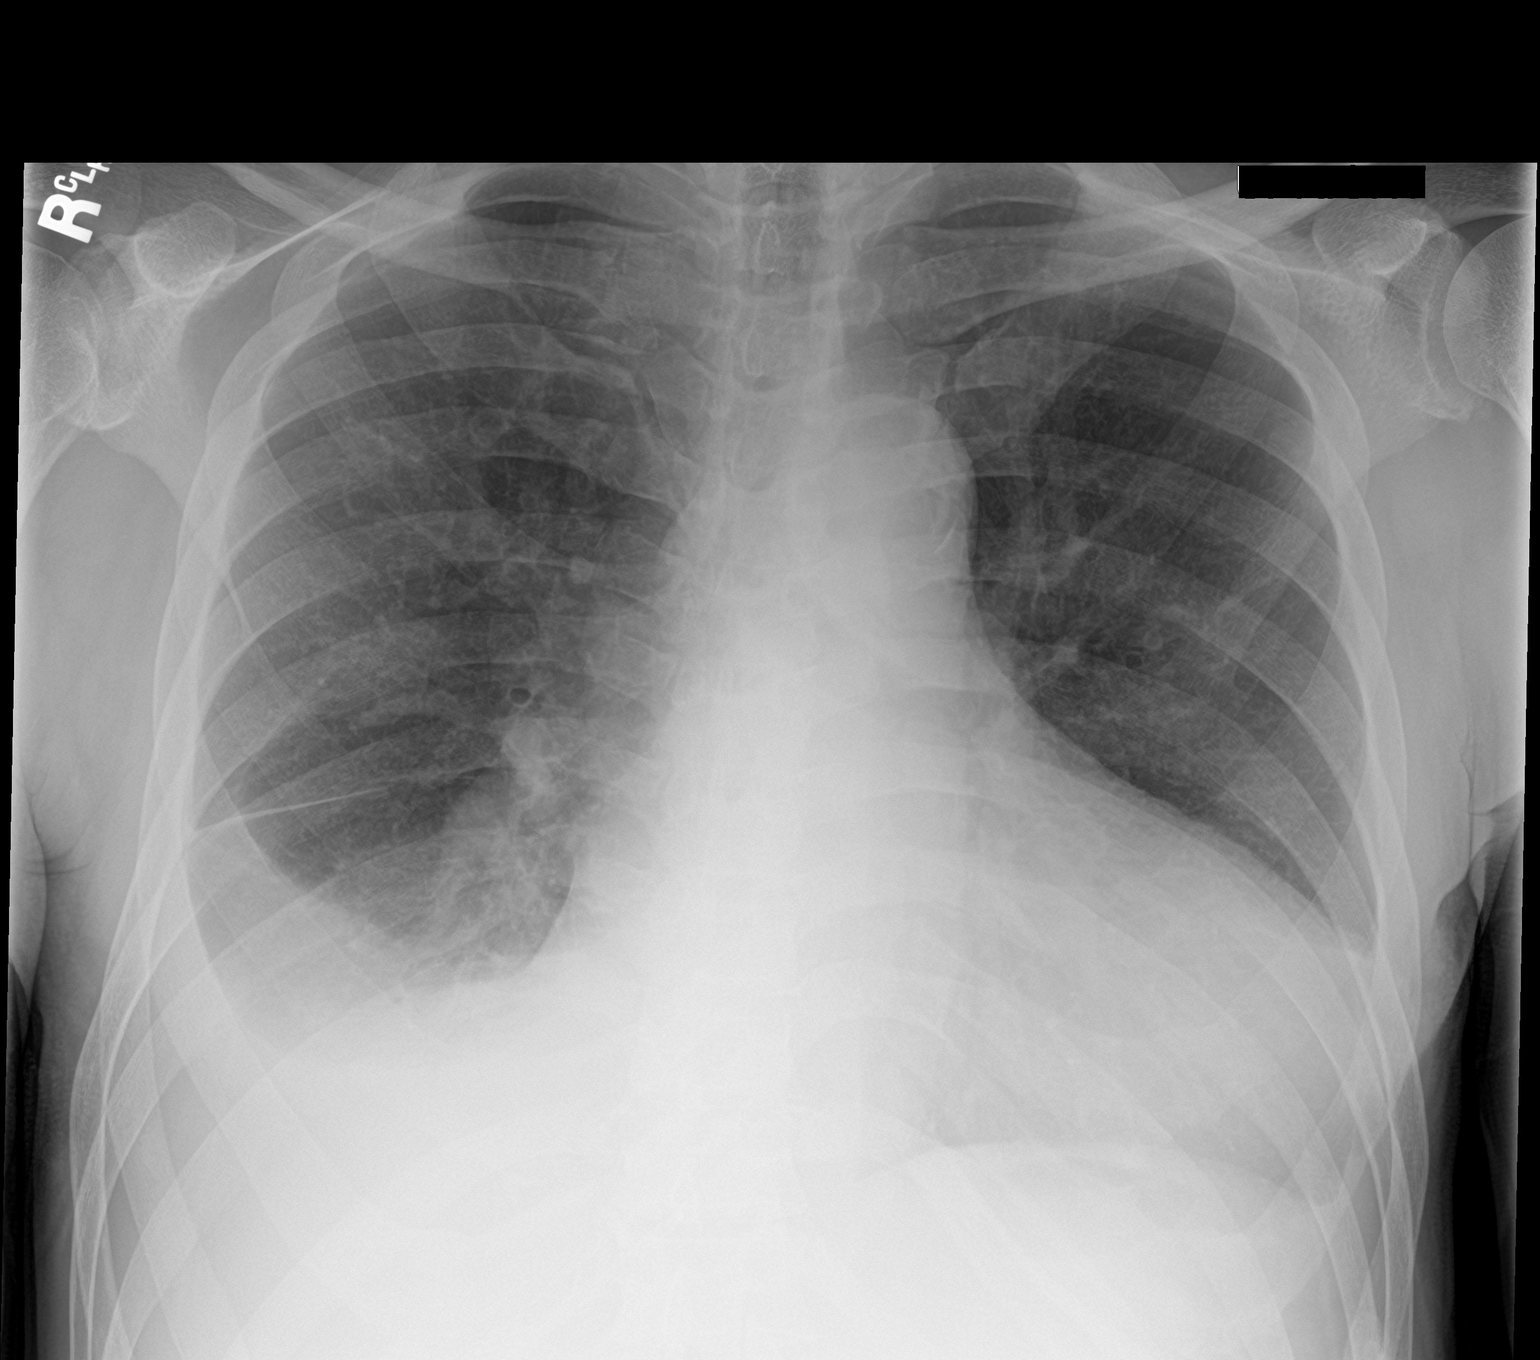

[2 of 2 positions shown; findings below may reference images not displayed]

FINDINGS: Stable cardiomegaly with aortic atherosclerosis. Small to moderate
right-sided pleural effusion spanning 2 vertebral body heights on
the lateral view. Stable small left effusion. Interval decrease in
pulmonary edema. No acute osseous abnormality.
IMPRESSION: Stable cardiomegaly with aortic atherosclerosis. Small to moderate
right pleural effusion and small left effusion, stable to slightly
decreased since prior. Interval decrease in pulmonary edema.

## 2020-04-06 IMAGING — DX DG CHEST 2V
2 series · 2 of 2 positions shown · non-contrast
Comparison: Chest radiograph 11/13/2017

CLINICAL DATA: Patient with chest pain and shortness of breath.

EXAM:
CHEST - 2 VIEW

[chest lat]
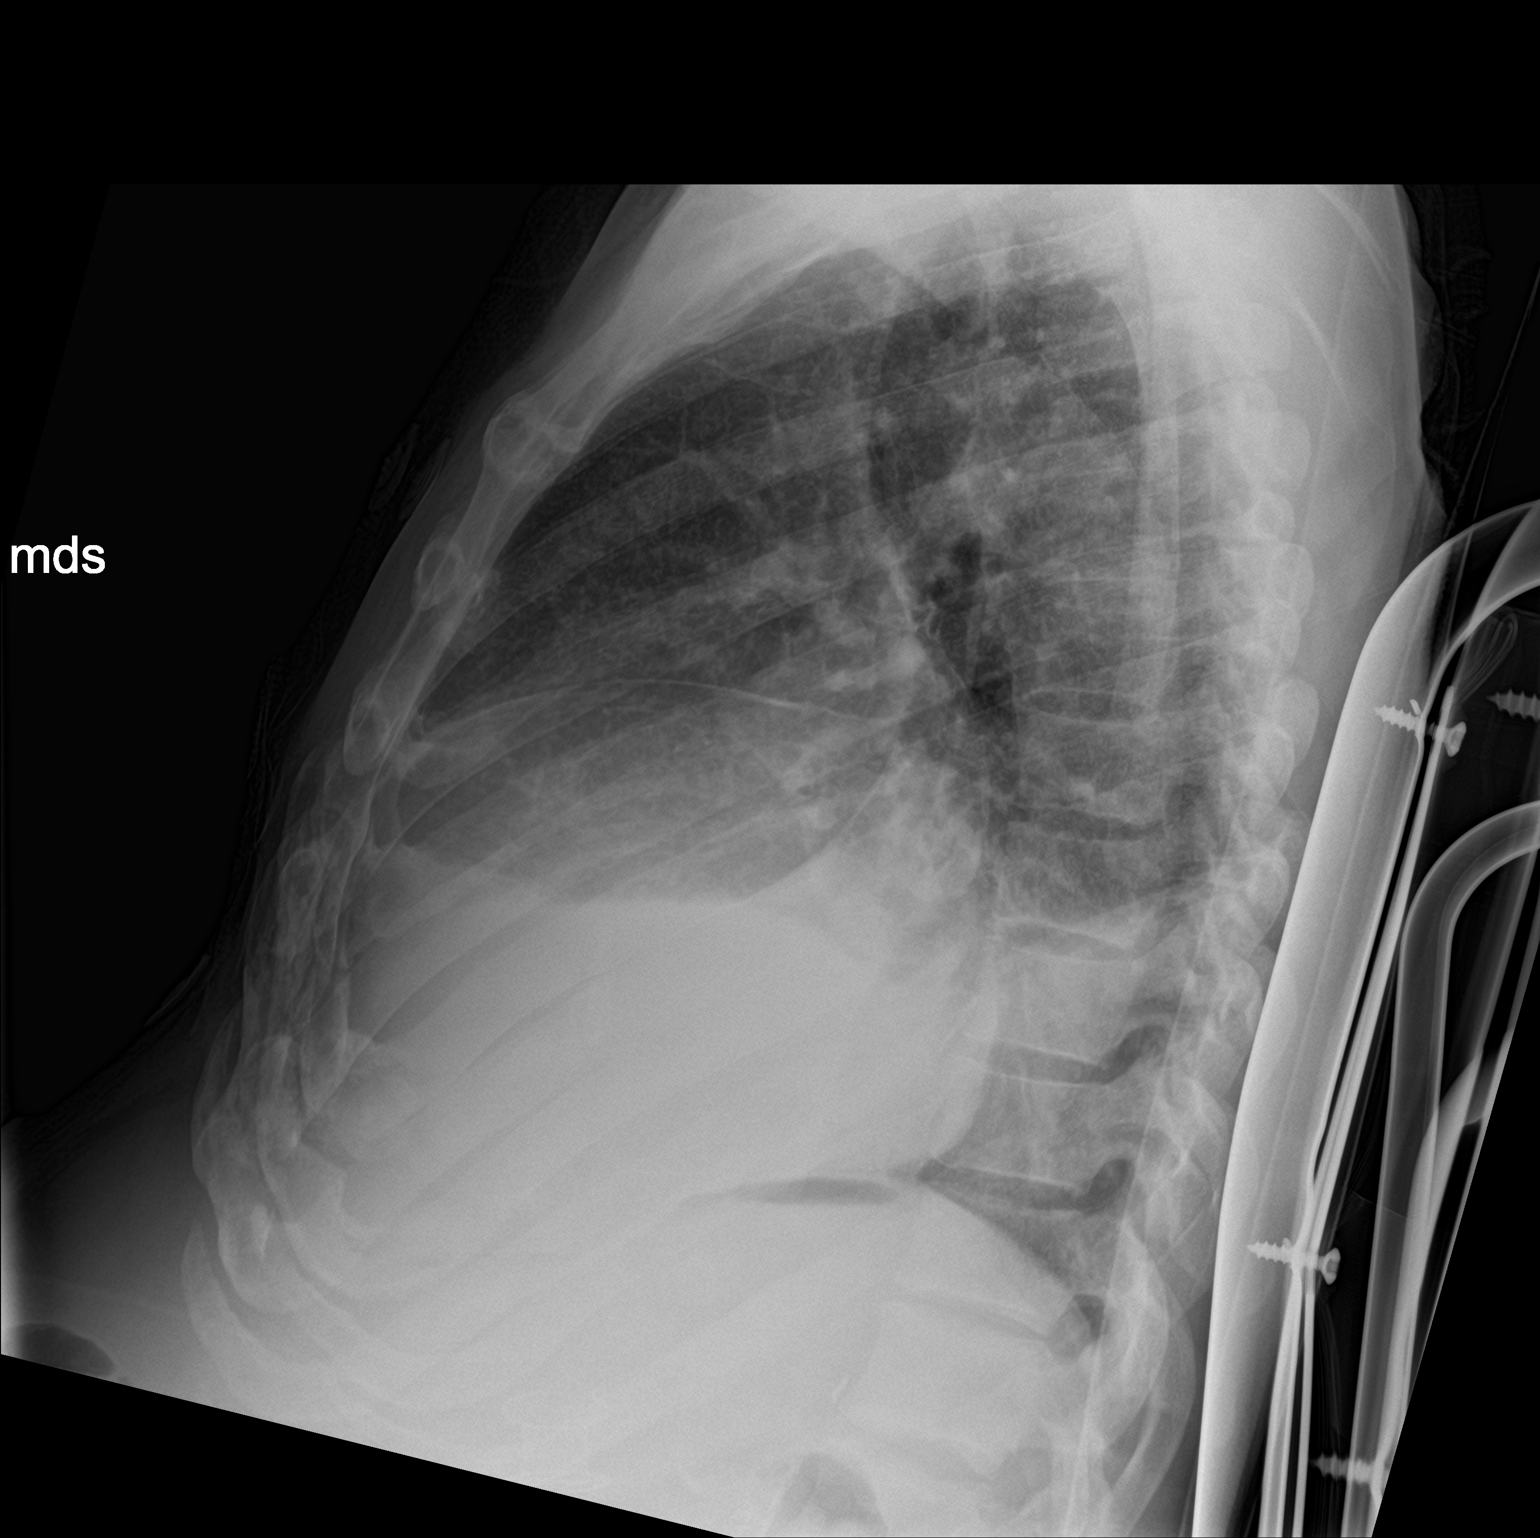

[chest ap]
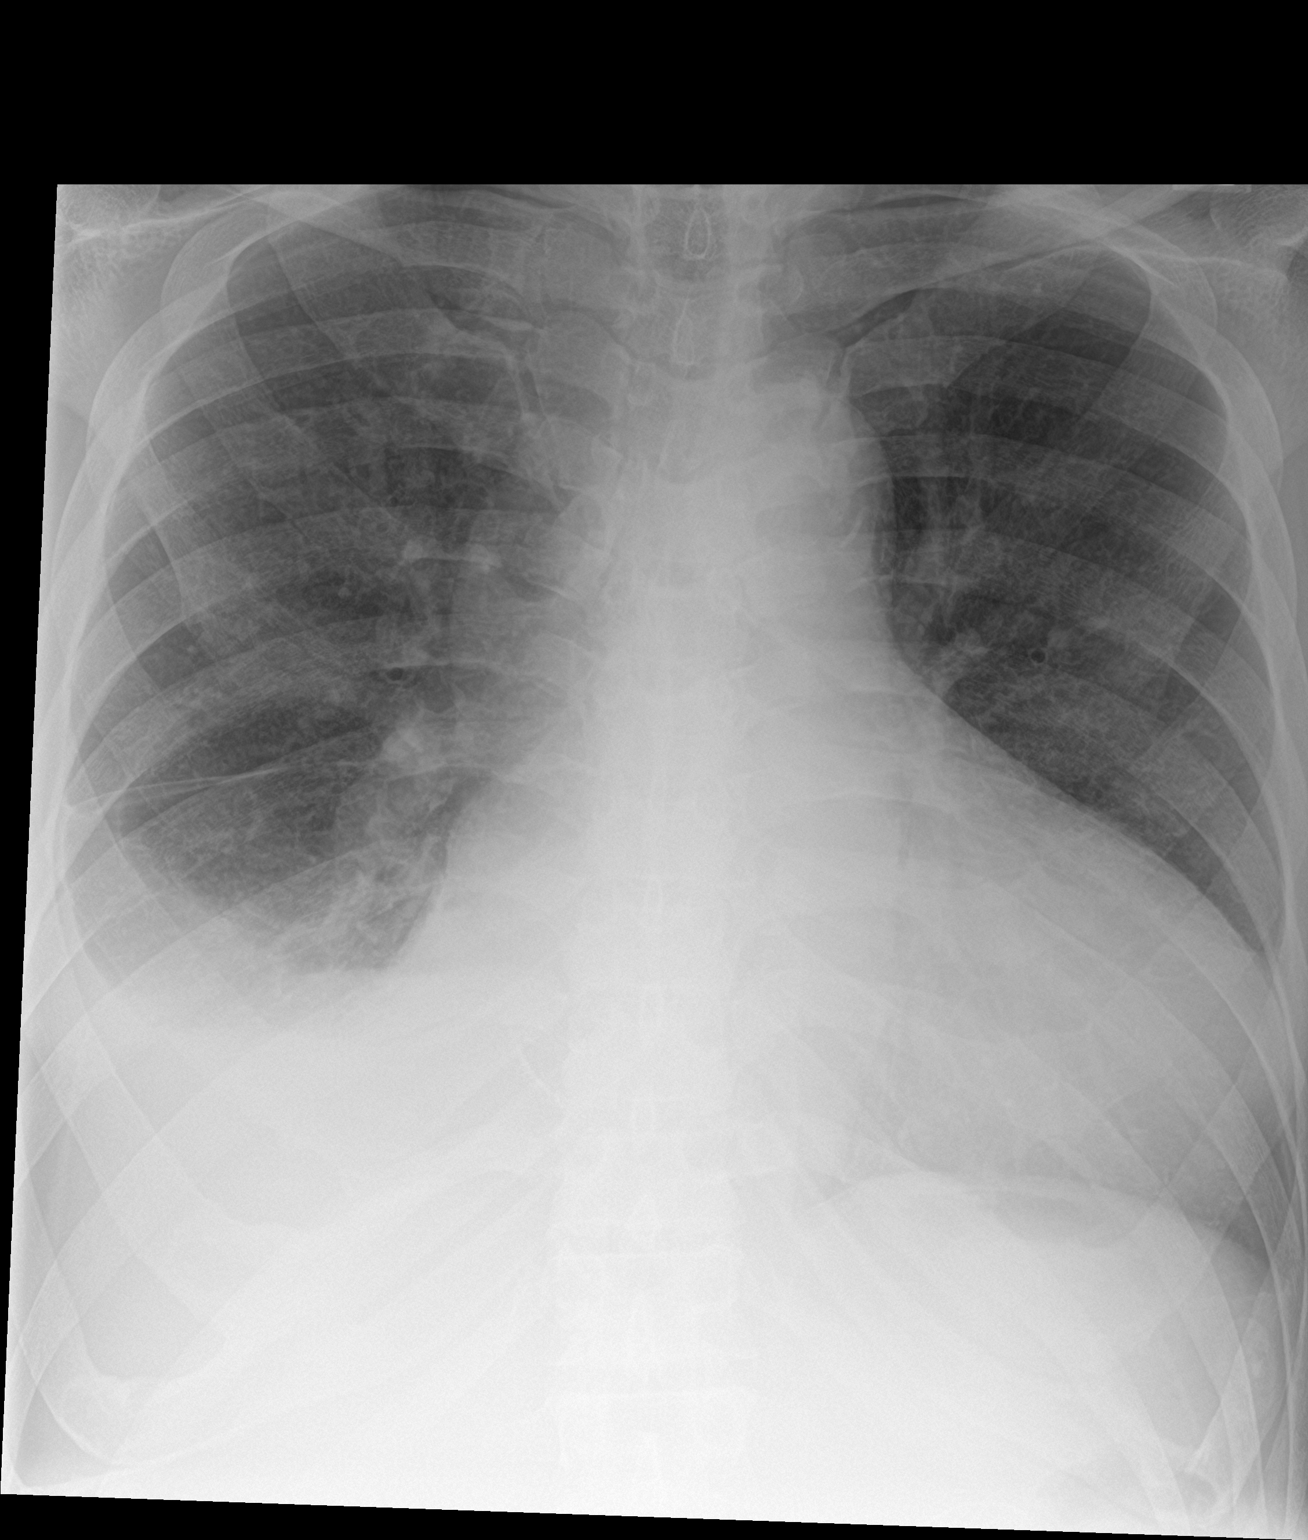

[2 of 2 positions shown; findings below may reference images not displayed]

FINDINGS: Stable cardiomegaly. Stable moderate right pleural effusion.
Underlying heterogeneous opacities. No pneumothorax. Thoracic spine
degenerative changes.
IMPRESSION: Stable moderate right pleural effusion with underlying opacities
favored to represent atelectasis.

## 2020-04-07 ENCOUNTER — Telehealth (HOSPITAL_COMMUNITY): Payer: Self-pay | Admitting: General Practice

## 2020-04-07 ENCOUNTER — Emergency Department (HOSPITAL_BASED_OUTPATIENT_CLINIC_OR_DEPARTMENT_OTHER)
Admission: EM | Admit: 2020-04-07 | Discharge: 2020-04-07 | Disposition: A | Discharge status: Home / Self Care | Payer: Medicare Other | Attending: Emergency Medicine | Admitting: Emergency Medicine

## 2020-04-07 ENCOUNTER — Other Ambulatory Visit: Payer: Self-pay

## 2020-04-07 ENCOUNTER — Encounter (HOSPITAL_BASED_OUTPATIENT_CLINIC_OR_DEPARTMENT_OTHER): Payer: Self-pay | Admitting: *Deleted

## 2020-04-07 ENCOUNTER — Emergency Department (HOSPITAL_BASED_OUTPATIENT_CLINIC_OR_DEPARTMENT_OTHER): Payer: Medicare Other

## 2020-04-07 DIAGNOSIS — R1011 Right upper quadrant pain: Secondary | ICD-10-CM | POA: Insufficient documentation

## 2020-04-07 DIAGNOSIS — I132 Hypertensive heart and chronic kidney disease with heart failure and with stage 5 chronic kidney disease, or end stage renal disease: Secondary | ICD-10-CM | POA: Diagnosis not present

## 2020-04-07 DIAGNOSIS — Z992 Dependence on renal dialysis: Secondary | ICD-10-CM | POA: Diagnosis not present

## 2020-04-07 DIAGNOSIS — R1084 Generalized abdominal pain: Secondary | ICD-10-CM

## 2020-04-07 DIAGNOSIS — E1122 Type 2 diabetes mellitus with diabetic chronic kidney disease: Secondary | ICD-10-CM | POA: Insufficient documentation

## 2020-04-07 DIAGNOSIS — Z87891 Personal history of nicotine dependence: Secondary | ICD-10-CM | POA: Diagnosis not present

## 2020-04-07 DIAGNOSIS — R1013 Epigastric pain: Secondary | ICD-10-CM | POA: Diagnosis not present

## 2020-04-07 DIAGNOSIS — Z79899 Other long term (current) drug therapy: Secondary | ICD-10-CM | POA: Insufficient documentation

## 2020-04-07 DIAGNOSIS — J441 Chronic obstructive pulmonary disease with (acute) exacerbation: Secondary | ICD-10-CM | POA: Diagnosis not present

## 2020-04-07 DIAGNOSIS — Z7901 Long term (current) use of anticoagulants: Secondary | ICD-10-CM | POA: Insufficient documentation

## 2020-04-07 DIAGNOSIS — R112 Nausea with vomiting, unspecified: Secondary | ICD-10-CM | POA: Insufficient documentation

## 2020-04-07 DIAGNOSIS — N186 End stage renal disease: Secondary | ICD-10-CM | POA: Diagnosis not present

## 2020-04-07 DIAGNOSIS — I5042 Chronic combined systolic (congestive) and diastolic (congestive) heart failure: Secondary | ICD-10-CM | POA: Insufficient documentation

## 2020-04-07 LAB — CBC WITH DIFFERENTIAL/PLATELET
Abs Immature Granulocytes: 0.04 10*3/uL (ref 0.00–0.07)
Basophils Absolute: 0 10*3/uL (ref 0.0–0.1)
Basophils Relative: 1 %
Eosinophils Absolute: 0.5 10*3/uL (ref 0.0–0.5)
Eosinophils Relative: 13 %
HCT: 32.8 % — ABNORMAL LOW (ref 39.0–52.0)
Hemoglobin: 9.8 g/dL — ABNORMAL LOW (ref 13.0–17.0)
Immature Granulocytes: 1 %
Lymphocytes Relative: 14 %
Lymphs Abs: 0.6 10*3/uL — ABNORMAL LOW (ref 0.7–4.0)
MCH: 25.4 pg — ABNORMAL LOW (ref 26.0–34.0)
MCHC: 29.9 g/dL — ABNORMAL LOW (ref 30.0–36.0)
MCV: 85 fL (ref 80.0–100.0)
Monocytes Absolute: 0.5 10*3/uL (ref 0.1–1.0)
Monocytes Relative: 12 %
Neutro Abs: 2.4 10*3/uL (ref 1.7–7.7)
Neutrophils Relative %: 59 %
Platelets: 134 10*3/uL — ABNORMAL LOW (ref 150–400)
RBC: 3.86 MIL/uL — ABNORMAL LOW (ref 4.22–5.81)
RDW: 19.5 % — ABNORMAL HIGH (ref 11.5–15.5)
WBC: 4.1 10*3/uL (ref 4.0–10.5)
nRBC: 0 % (ref 0.0–0.2)

## 2020-04-07 LAB — HEPATIC FUNCTION PANEL
ALT: 11 U/L (ref 0–44)
AST: 19 U/L (ref 15–41)
Albumin: 3.4 g/dL — ABNORMAL LOW (ref 3.5–5.0)
Alkaline Phosphatase: 202 U/L — ABNORMAL HIGH (ref 38–126)
Bilirubin, Direct: 0.2 mg/dL (ref 0.0–0.2)
Indirect Bilirubin: 0.6 mg/dL (ref 0.3–0.9)
Total Bilirubin: 0.8 mg/dL (ref 0.3–1.2)
Total Protein: 8.1 g/dL (ref 6.5–8.1)

## 2020-04-07 LAB — BASIC METABOLIC PANEL
Anion gap: 13 (ref 5–15)
BUN: 47 mg/dL — ABNORMAL HIGH (ref 6–20)
CO2: 29 mmol/L (ref 22–32)
Calcium: 8.7 mg/dL — ABNORMAL LOW (ref 8.9–10.3)
Chloride: 95 mmol/L — ABNORMAL LOW (ref 98–111)
Creatinine, Ser: 10.47 mg/dL — ABNORMAL HIGH (ref 0.61–1.24)
GFR calc Af Amer: 6 mL/min — ABNORMAL LOW (ref >60)
GFR calc non Af Amer: 5 mL/min — ABNORMAL LOW (ref >60)
Glucose, Bld: 126 mg/dL — ABNORMAL HIGH (ref 70–99)
Potassium: 4 mmol/L (ref 3.5–5.1)
Sodium: 137 mmol/L (ref 135–145)

## 2020-04-07 LAB — LIPASE, BLOOD: Lipase: 32 U/L (ref 11–51)

## 2020-04-07 MED ORDER — ONDANSETRON HCL 4 MG/2ML IJ SOLN
4.0000 mg | Freq: Once | INTRAMUSCULAR | Status: AC
  Administered 2020-04-07: 4 mg via INTRAVENOUS
  Filled 2020-04-07: qty 2

## 2020-04-07 MED ORDER — FENTANYL CITRATE (PF) 100 MCG/2ML IJ SOLN
100.0000 ug | Freq: Once | INTRAMUSCULAR | Status: AC
  Administered 2020-04-07: 100 ug via INTRAVENOUS
  Filled 2020-04-07: qty 2

## 2020-04-07 MED ORDER — PROMETHAZINE HCL 12.5 MG RE SUPP: 12.5000 mg | Freq: Four times a day (QID) | RECTAL | 0 refills | Status: DC | PRN

## 2020-04-07 MED ORDER — MORPHINE SULFATE (PF) 4 MG/ML IV SOLN
4.0000 mg | Freq: Once | INTRAVENOUS | Status: AC
  Administered 2020-04-07: 4 mg via INTRAVENOUS
  Filled 2020-04-07: qty 1
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
## 2020-04-07 NOTE — Telephone Encounter (Signed)
Just an FYI. We have made several attempts to contact this patient including sending a letter to schedule or reschedule their echocardiogram. We will be removing the patient from the echo [place].   03/28/20 Called and tel# id not working MAILED LETTER/LBW  02/15/2020 2:56 pm called pt to schedule echo, pt was unavailable and requests a call back. - sz    Thank you

## 2020-04-07 NOTE — ED Triage Notes (Signed)
C/o abd pain w nausea x 2-3 days

## 2020-04-12 ENCOUNTER — Encounter (HOSPITAL_BASED_OUTPATIENT_CLINIC_OR_DEPARTMENT_OTHER): Payer: Self-pay | Admitting: Emergency Medicine

## 2020-04-12 ENCOUNTER — Emergency Department (HOSPITAL_BASED_OUTPATIENT_CLINIC_OR_DEPARTMENT_OTHER)
Admission: EM | Admit: 2020-04-12 | Discharge: 2020-04-12 | Disposition: A | Discharge status: Home / Self Care | Payer: Medicare Other | Attending: Emergency Medicine | Admitting: Emergency Medicine

## 2020-04-12 ENCOUNTER — Other Ambulatory Visit: Payer: Self-pay

## 2020-04-12 DIAGNOSIS — Z87891 Personal history of nicotine dependence: Secondary | ICD-10-CM | POA: Insufficient documentation

## 2020-04-12 DIAGNOSIS — I5042 Chronic combined systolic (congestive) and diastolic (congestive) heart failure: Secondary | ICD-10-CM | POA: Insufficient documentation

## 2020-04-12 DIAGNOSIS — G8929 Other chronic pain: Secondary | ICD-10-CM | POA: Diagnosis not present

## 2020-04-12 DIAGNOSIS — N186 End stage renal disease: Secondary | ICD-10-CM | POA: Insufficient documentation

## 2020-04-12 DIAGNOSIS — R1013 Epigastric pain: Secondary | ICD-10-CM | POA: Diagnosis present

## 2020-04-12 DIAGNOSIS — Z7901 Long term (current) use of anticoagulants: Secondary | ICD-10-CM | POA: Diagnosis not present

## 2020-04-12 DIAGNOSIS — Z79899 Other long term (current) drug therapy: Secondary | ICD-10-CM | POA: Diagnosis not present

## 2020-04-12 DIAGNOSIS — Z992 Dependence on renal dialysis: Secondary | ICD-10-CM | POA: Diagnosis not present

## 2020-04-12 DIAGNOSIS — Z765 Malingerer [conscious simulation]: Secondary | ICD-10-CM

## 2020-04-12 DIAGNOSIS — E1122 Type 2 diabetes mellitus with diabetic chronic kidney disease: Secondary | ICD-10-CM | POA: Insufficient documentation

## 2020-04-12 DIAGNOSIS — J441 Chronic obstructive pulmonary disease with (acute) exacerbation: Secondary | ICD-10-CM | POA: Insufficient documentation

## 2020-04-12 DIAGNOSIS — I132 Hypertensive heart and chronic kidney disease with heart failure and with stage 5 chronic kidney disease, or end stage renal disease: Secondary | ICD-10-CM | POA: Insufficient documentation

## 2020-04-12 MED ORDER — PROMETHAZINE HCL 25 MG/ML IJ SOLN
25.0000 mg | Freq: Once | INTRAMUSCULAR | Status: AC
  Administered 2020-04-12: 25 mg via INTRAMUSCULAR
  Filled 2020-04-12: qty 1

## 2020-04-12 MED ORDER — OXYCODONE HCL 5 MG PO TABS
15.0000 mg | ORAL_TABLET | Freq: Once | ORAL | Status: AC
  Administered 2020-04-12: 15 mg via ORAL
  Filled 2020-04-12: qty 3

## 2020-04-12 NOTE — ED Notes (Addendum)
This RN went to discharge pt, pt refused paperwork and said "y'all haven't done anything for me." This RN explained to the pt that we treated his nausea so he could take his medication at home and gave him some pain medicine while here; pt verbalized wanting to see the doctor; this RN relayed the information to EDP [place], who said to have security escort the pt out; this RN spoke to [employer] RN and called security; pt was escorted out, refused vitals and signature

## 2020-04-12 NOTE — ED Triage Notes (Signed)
Vomiting and chest pain x2 days, states its his pancreatitis acting up again.
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
## 2020-04-14 ENCOUNTER — Encounter (HOSPITAL_COMMUNITY): Payer: Self-pay

## 2020-04-14 ENCOUNTER — Emergency Department (HOSPITAL_COMMUNITY)
Admission: EM | Admit: 2020-04-14 | Discharge: 2020-04-14 | Disposition: A | Discharge status: Home / Self Care | Payer: Medicare Other | Attending: Emergency Medicine | Admitting: Emergency Medicine

## 2020-04-14 ENCOUNTER — Other Ambulatory Visit: Payer: Self-pay

## 2020-04-14 DIAGNOSIS — G8929 Other chronic pain: Secondary | ICD-10-CM | POA: Insufficient documentation

## 2020-04-14 DIAGNOSIS — J441 Chronic obstructive pulmonary disease with (acute) exacerbation: Secondary | ICD-10-CM | POA: Insufficient documentation

## 2020-04-14 DIAGNOSIS — R109 Unspecified abdominal pain: Secondary | ICD-10-CM | POA: Insufficient documentation

## 2020-04-14 DIAGNOSIS — I5043 Acute on chronic combined systolic (congestive) and diastolic (congestive) heart failure: Secondary | ICD-10-CM | POA: Diagnosis not present

## 2020-04-14 DIAGNOSIS — N186 End stage renal disease: Secondary | ICD-10-CM | POA: Insufficient documentation

## 2020-04-14 DIAGNOSIS — Z955 Presence of coronary angioplasty implant and graft: Secondary | ICD-10-CM | POA: Insufficient documentation

## 2020-04-14 DIAGNOSIS — Z7901 Long term (current) use of anticoagulants: Secondary | ICD-10-CM | POA: Diagnosis not present

## 2020-04-14 DIAGNOSIS — E1122 Type 2 diabetes mellitus with diabetic chronic kidney disease: Secondary | ICD-10-CM | POA: Insufficient documentation

## 2020-04-14 DIAGNOSIS — E162 Hypoglycemia, unspecified: Secondary | ICD-10-CM

## 2020-04-14 DIAGNOSIS — Z79899 Other long term (current) drug therapy: Secondary | ICD-10-CM | POA: Diagnosis not present

## 2020-04-14 DIAGNOSIS — K219 Gastro-esophageal reflux disease without esophagitis: Secondary | ICD-10-CM | POA: Insufficient documentation

## 2020-04-14 DIAGNOSIS — Z87891 Personal history of nicotine dependence: Secondary | ICD-10-CM | POA: Insufficient documentation

## 2020-04-14 DIAGNOSIS — R112 Nausea with vomiting, unspecified: Secondary | ICD-10-CM | POA: Diagnosis not present

## 2020-04-14 DIAGNOSIS — Z992 Dependence on renal dialysis: Secondary | ICD-10-CM | POA: Insufficient documentation

## 2020-04-14 DIAGNOSIS — I132 Hypertensive heart and chronic kidney disease with heart failure and with stage 5 chronic kidney disease, or end stage renal disease: Secondary | ICD-10-CM | POA: Insufficient documentation

## 2020-04-14 LAB — COMPREHENSIVE METABOLIC PANEL
ALT: 12 U/L (ref 0–44)
AST: 18 U/L (ref 15–41)
Albumin: 3.2 g/dL — ABNORMAL LOW (ref 3.5–5.0)
Alkaline Phosphatase: 194 U/L — ABNORMAL HIGH (ref 38–126)
Anion gap: 14 (ref 5–15)
BUN: 46 mg/dL — ABNORMAL HIGH (ref 6–20)
CO2: 29 mmol/L (ref 22–32)
Calcium: 8.6 mg/dL — ABNORMAL LOW (ref 8.9–10.3)
Chloride: 97 mmol/L — ABNORMAL LOW (ref 98–111)
Creatinine, Ser: 10.79 mg/dL — ABNORMAL HIGH (ref 0.61–1.24)
GFR calc Af Amer: 5 mL/min — ABNORMAL LOW (ref >60)
GFR calc non Af Amer: 5 mL/min — ABNORMAL LOW (ref >60)
Glucose, Bld: 84 mg/dL (ref 70–99)
Potassium: 4.7 mmol/L (ref 3.5–5.1)
Sodium: 140 mmol/L (ref 135–145)
Total Bilirubin: 0.9 mg/dL (ref 0.3–1.2)
Total Protein: 7.6 g/dL (ref 6.5–8.1)

## 2020-04-14 LAB — CBC WITH DIFFERENTIAL/PLATELET
Abs Immature Granulocytes: 0.02 10*3/uL (ref 0.00–0.07)
Basophils Absolute: 0 10*3/uL (ref 0.0–0.1)
Basophils Relative: 1 %
Eosinophils Absolute: 0.4 10*3/uL (ref 0.0–0.5)
Eosinophils Relative: 10 %
HCT: 30.9 % — ABNORMAL LOW (ref 39.0–52.0)
Hemoglobin: 9.8 g/dL — ABNORMAL LOW (ref 13.0–17.0)
Immature Granulocytes: 1 %
Lymphocytes Relative: 14 %
Lymphs Abs: 0.6 10*3/uL — ABNORMAL LOW (ref 0.7–4.0)
MCH: 26.5 pg (ref 26.0–34.0)
MCHC: 31.7 g/dL (ref 30.0–36.0)
MCV: 83.5 fL (ref 80.0–100.0)
Monocytes Absolute: 0.4 10*3/uL (ref 0.1–1.0)
Monocytes Relative: 10 %
Neutro Abs: 2.8 10*3/uL (ref 1.7–7.7)
Neutrophils Relative %: 64 %
Platelets: 111 10*3/uL — ABNORMAL LOW (ref 150–400)
RBC: 3.7 MIL/uL — ABNORMAL LOW (ref 4.22–5.81)
RDW: 19.9 % — ABNORMAL HIGH (ref 11.5–15.5)
WBC: 4.2 10*3/uL (ref 4.0–10.5)
nRBC: 0 % (ref 0.0–0.2)

## 2020-04-14 LAB — CBG MONITORING, ED
Glucose-Capillary: 56 mg/dL — ABNORMAL LOW (ref 70–99)
Glucose-Capillary: 65 mg/dL — ABNORMAL LOW (ref 70–99)

## 2020-04-14 LAB — LIPASE, BLOOD: Lipase: 33 U/L (ref 11–51)

## 2020-04-14 MED ORDER — OXYCODONE HCL 5 MG PO TABS
15.0000 mg | ORAL_TABLET | Freq: Once | ORAL | Status: AC
  Administered 2020-04-14: 15 mg via ORAL
  Filled 2020-04-14 (×2): qty 3

## 2020-04-14 MED ORDER — PROMETHAZINE HCL 25 MG/ML IJ SOLN: 25.0000 mg | Freq: Once | INTRAMUSCULAR | Status: DC

## 2020-04-14 MED ORDER — GLUCOSE 40 % PO GEL
1.0000 | Freq: Once | ORAL | Status: AC
  Administered 2020-04-14: 37.5 g via ORAL
  Filled 2020-04-14: qty 1

## 2020-04-14 NOTE — ED Notes (Signed)
Per Mickel Baas, [place] not need to check another CBG before discharge as Blood Glucose was 84 on lab work.

## 2020-04-14 NOTE — ED Triage Notes (Signed)
Patient reports vomiting and abdominal pain over the last two days. States he is having a pancreatitis flare up.

## 2020-04-14 NOTE — ED Notes (Signed)
IV team at bedside to try for IV.
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
## 2020-04-14 NOTE — Discharge Instructions (Addendum)
Follow-up with your doctor.  Take medications as previously prescribed.

## 2020-04-14 NOTE — ED Notes (Signed)
IV team unable to get [place] IV on patient.  IV team accessed dialysis port to get needed blood work. Patient has oral pain meds ordered and IM phenergan, he is refusing both.  Demanding IV pain medications, Mickel Baas, [place] is aware and stated patient can take the oral pain meds.  He is demanding to speak with a MD now.

## 2020-04-21 IMAGING — DX DG CHEST 2V
2 series · 2 of 2 positions shown · non-contrast
Comparison: PA and lateral chest x-ray November 16, 2017

CLINICAL DATA: Shortness of breath, chest and abdominal pain. The
patient could not cooperate fully with positioning.

EXAM:
CHEST - 2 VIEW

[w chest pa]
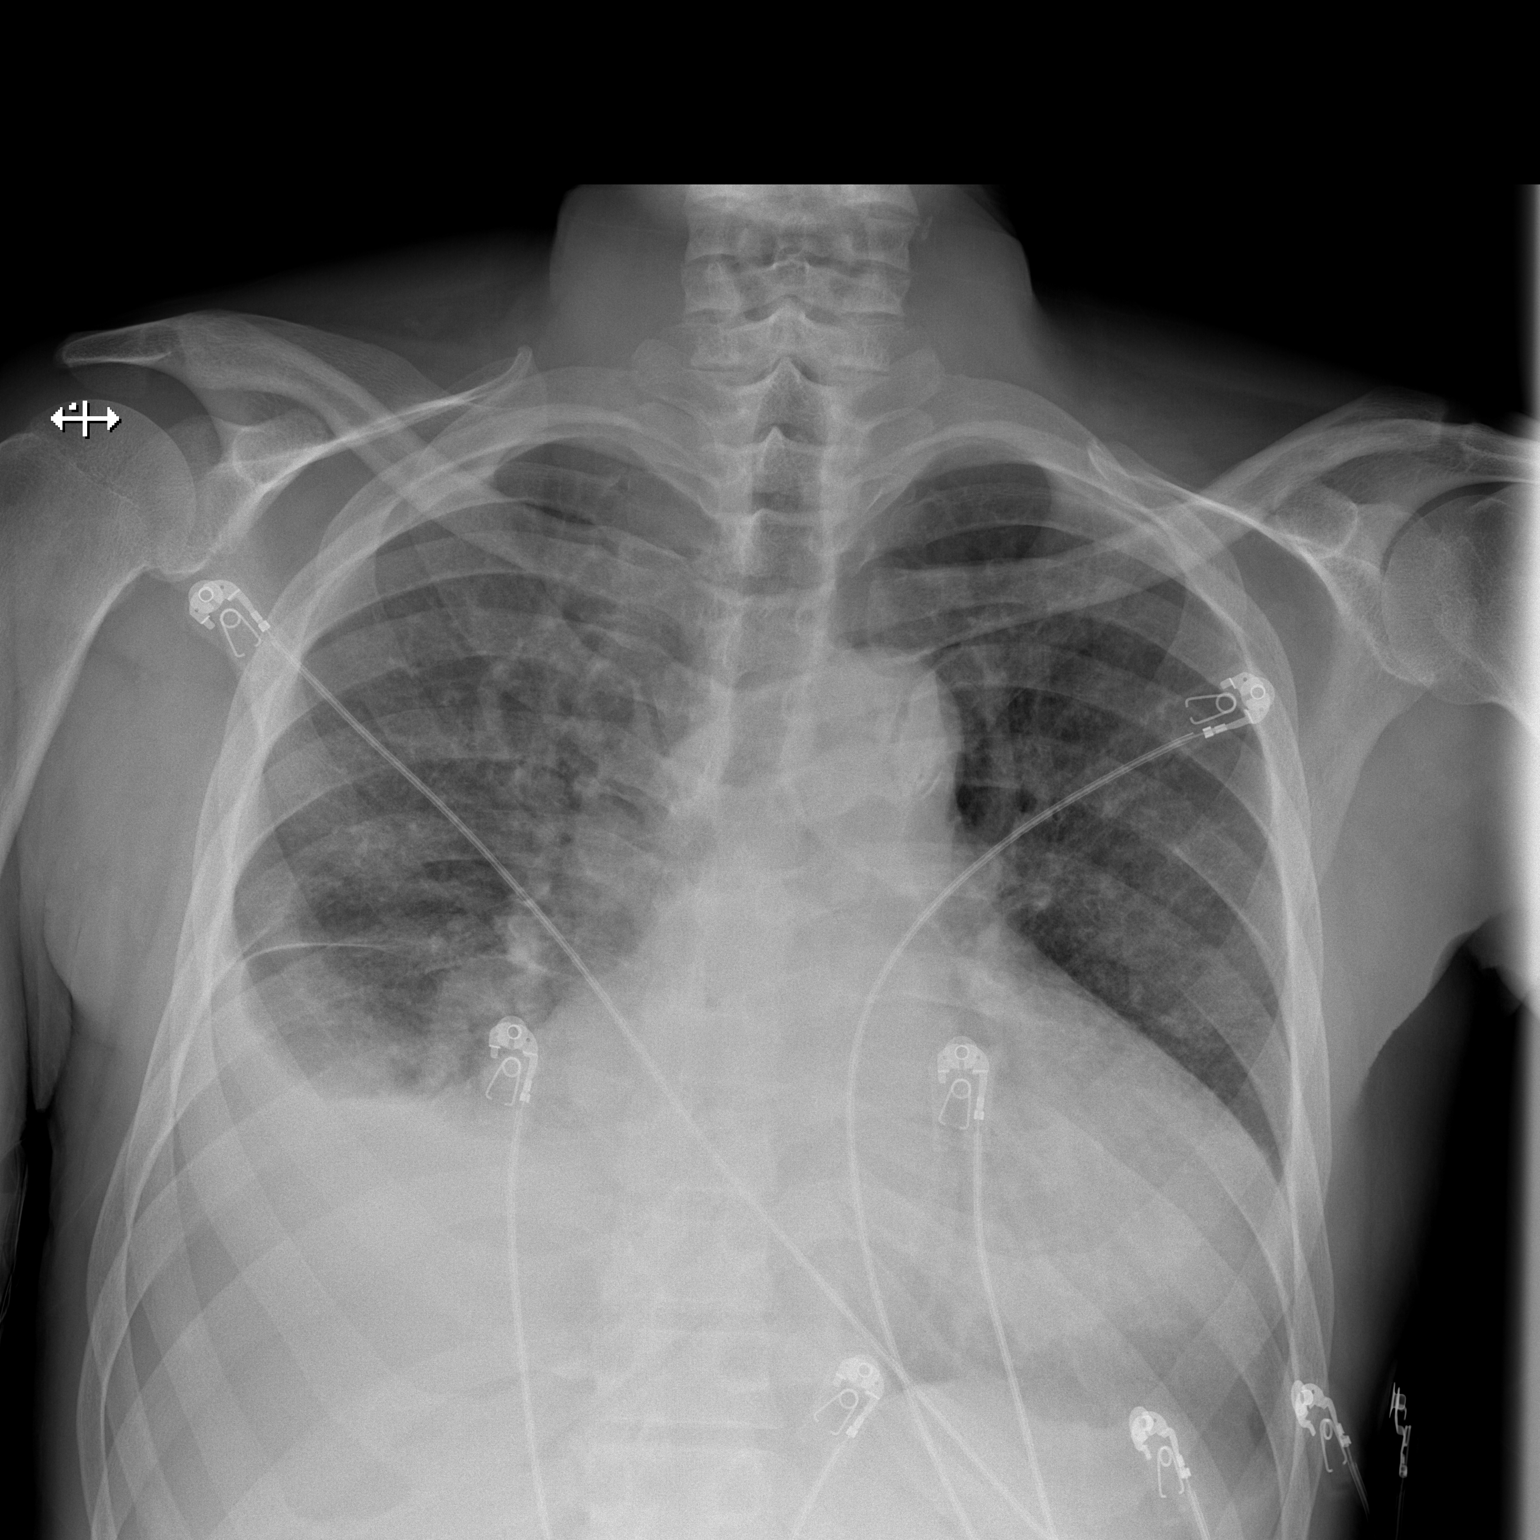

[w chest lat]
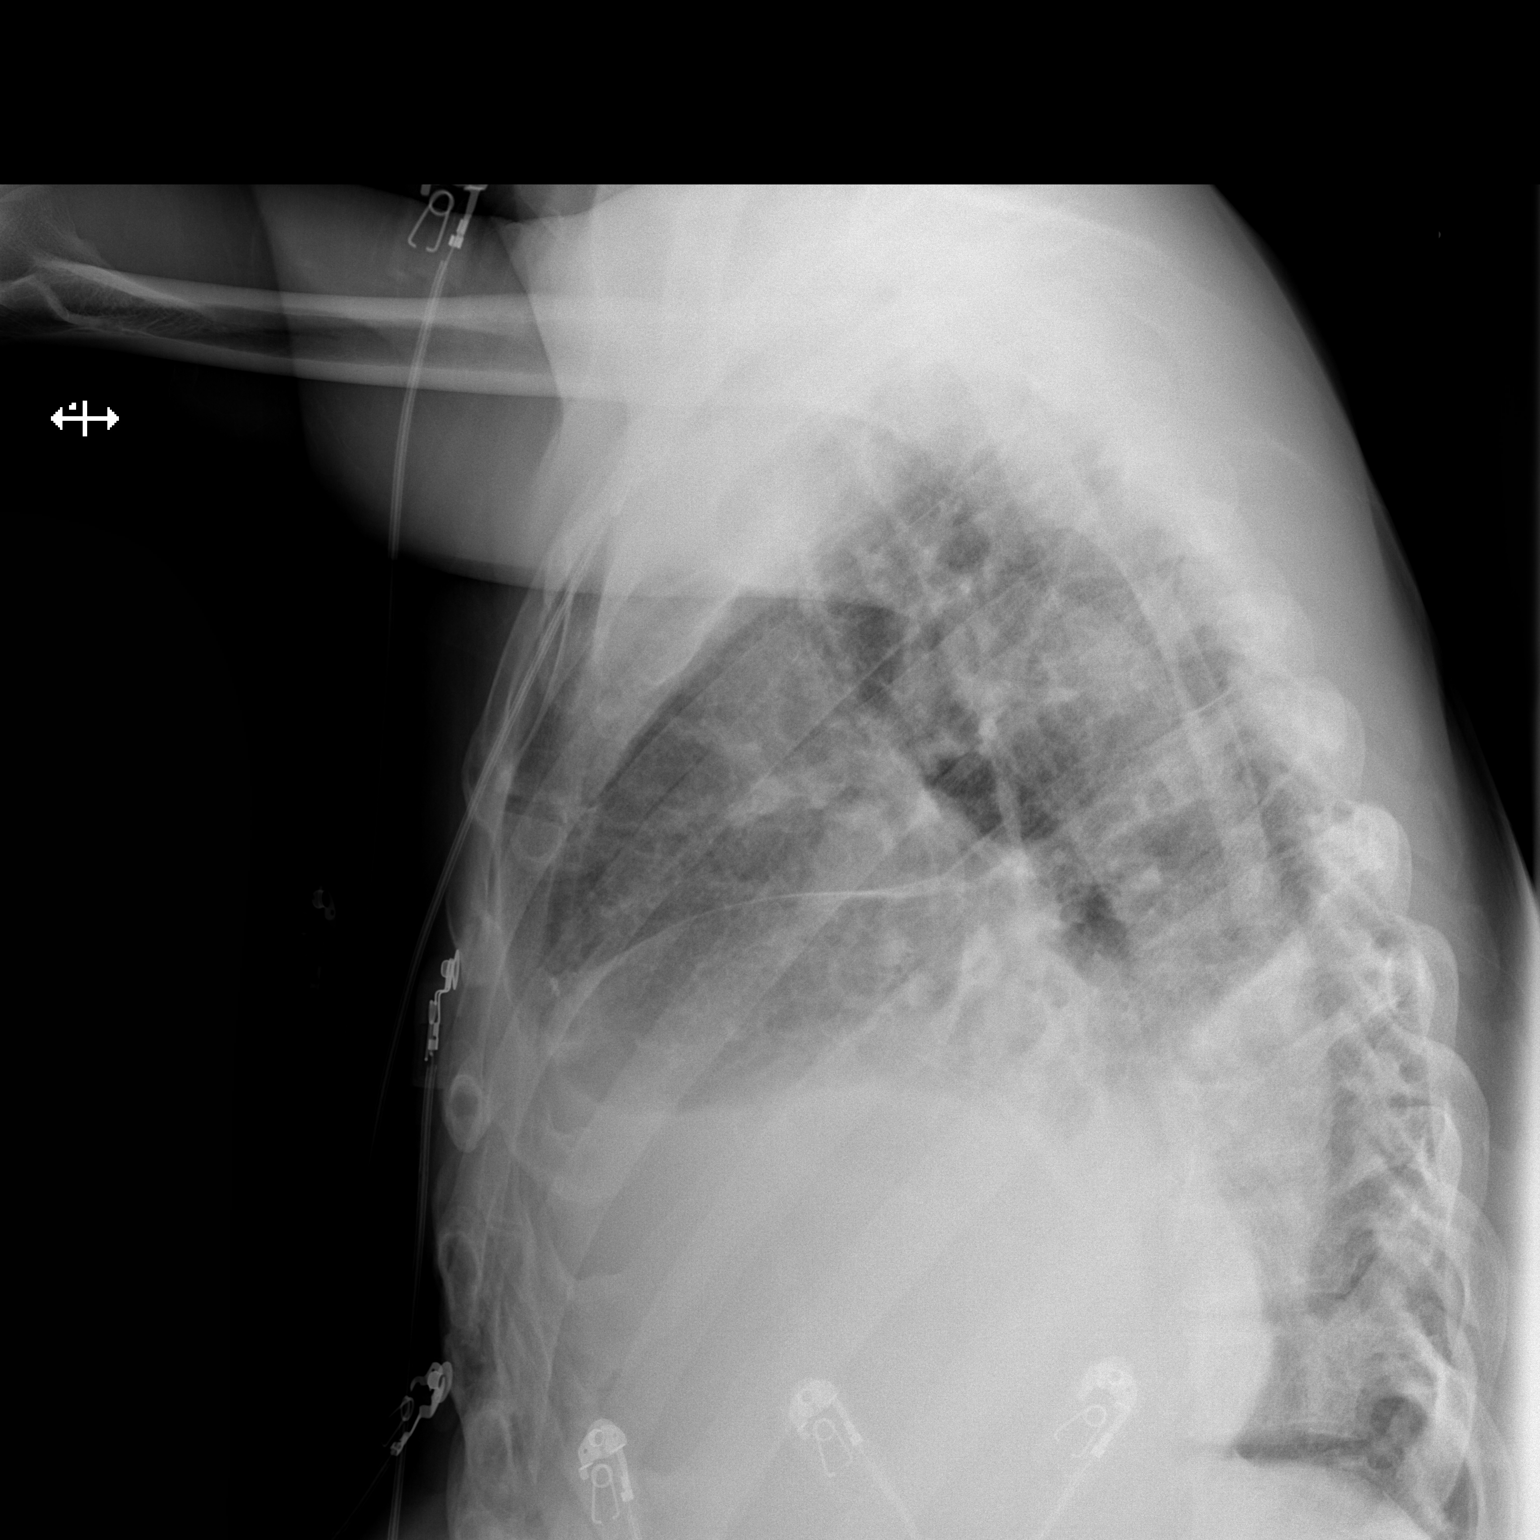

[2 of 2 positions shown; findings below may reference images not displayed]

FINDINGS: The left lung is well-expanded. On the right there is volume loss
due to a moderate-sized pleural effusion which is slightly more
conspicuous today. The interstitial markings are increased. And are
more conspicuous today. The cardiac silhouette remains enlarged. The
pulmonary vascularity is more engorged and less distinct. There is
calcification in the wall of the aortic arch. The bony thorax
exhibits no acute abnormality.
IMPRESSION: Increased volume of the right pleural effusion, increased pulmonary
interstitial edema, and increased pulmonary vascular congestion are
consistent with worsening CHF.

Thoracic aortic atherosclerosis.

## 2020-04-21 IMAGING — CT CT ABD-PELV W/O CM
2 of 4 series · 16 of 46 positions shown, 18 images · non-contrast
Comparison: CT 09/22/2017

CLINICAL DATA: Abdominal pain, nausea and vomiting. Dialysis
patient.

EXAM:
CT ABDOMEN AND PELVIS WITHOUT CONTRAST
TECHNIQUE: Multidetector CT imaging of the abdomen and pelvis was performed
following the standard protocol without IV contrast.

[Series 3: a/p w/o 5mm · axial · non-contrast · 0.79mm/px · z∈[+762,+1197]mm · 13 of 95 slices shown, 15 images]
[im 4/95  soft-tissue]
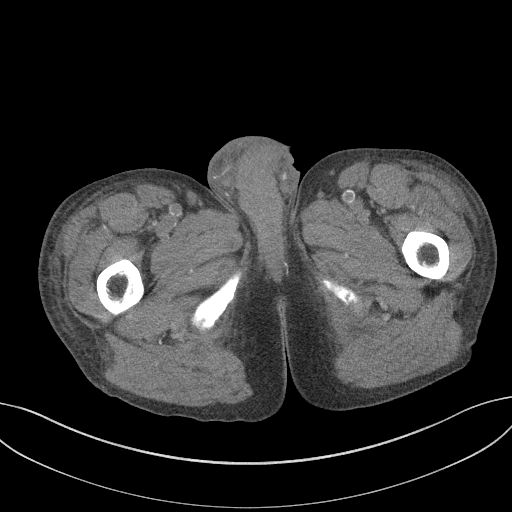
[im 4/95  bone]
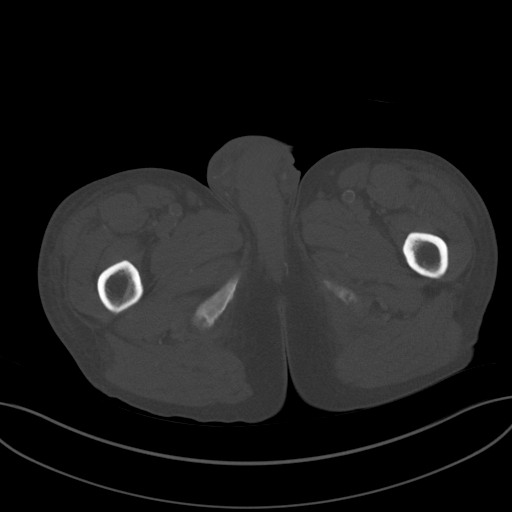
[im 12/95  soft-tissue]
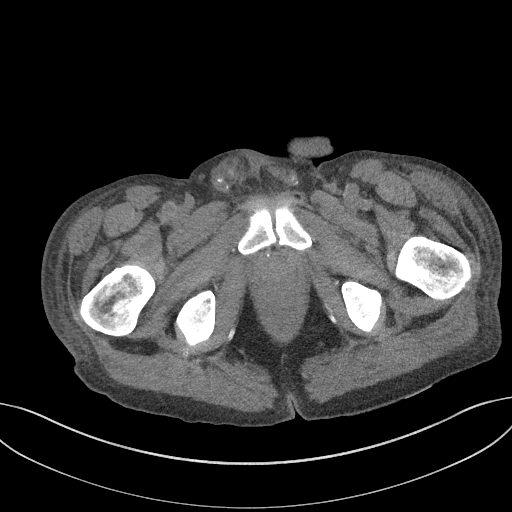
[im 20/95  soft-tissue]
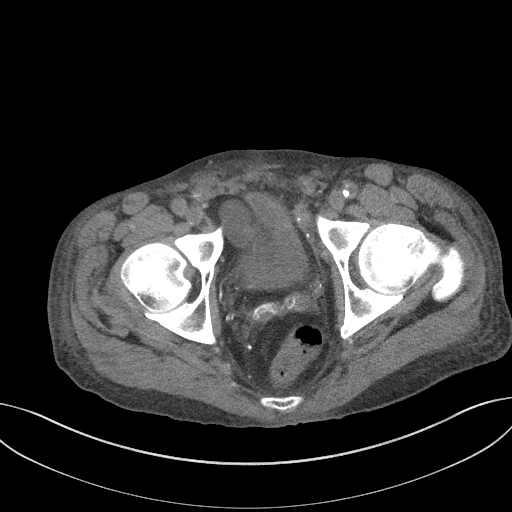
[im 28/95  soft-tissue]
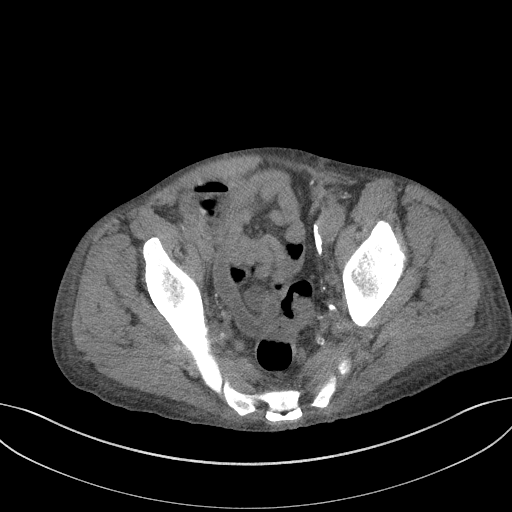
[im 32/95  soft-tissue]
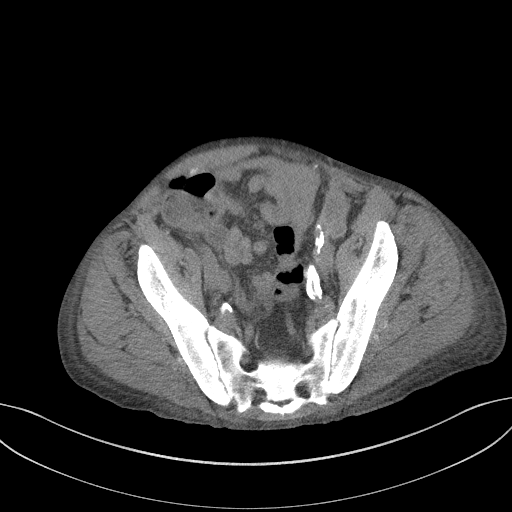
[im 40/95  soft-tissue]
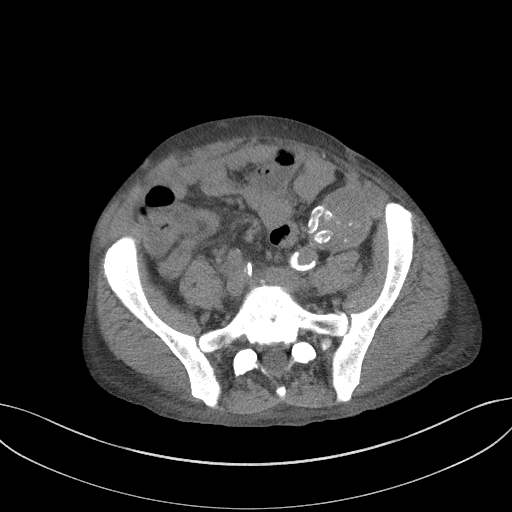
[im 48/95  soft-tissue]
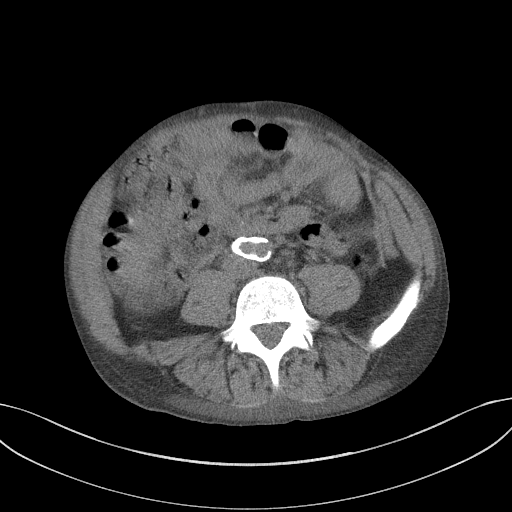
[im 55/95  soft-tissue]
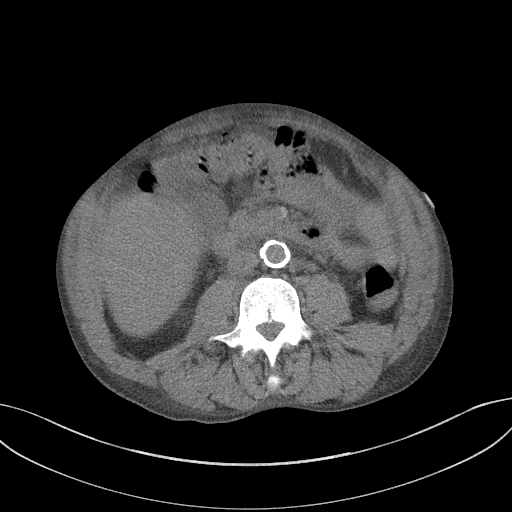
[im 63/95  soft-tissue]
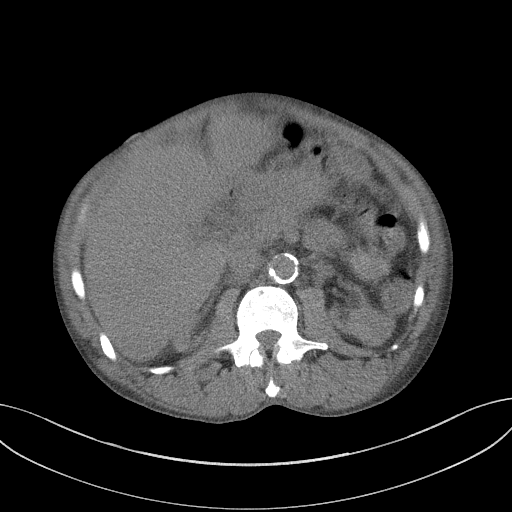
[im 63/95  bone]
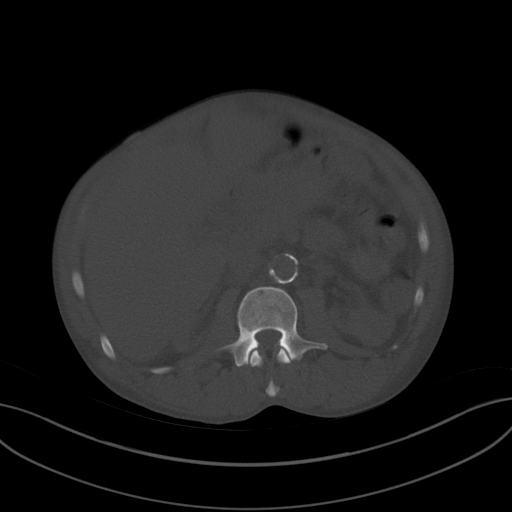
[im 67/95  soft-tissue]
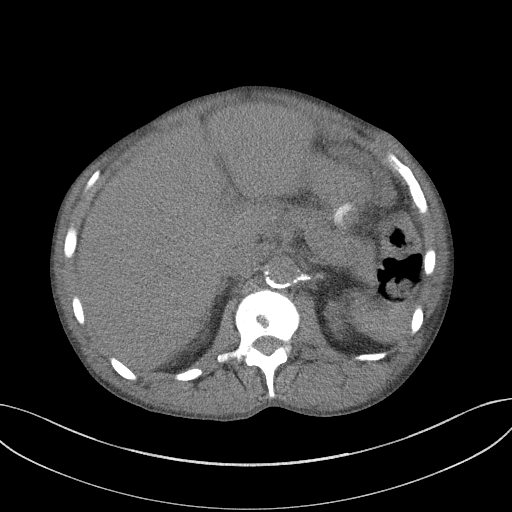
[im 75/95  soft-tissue]
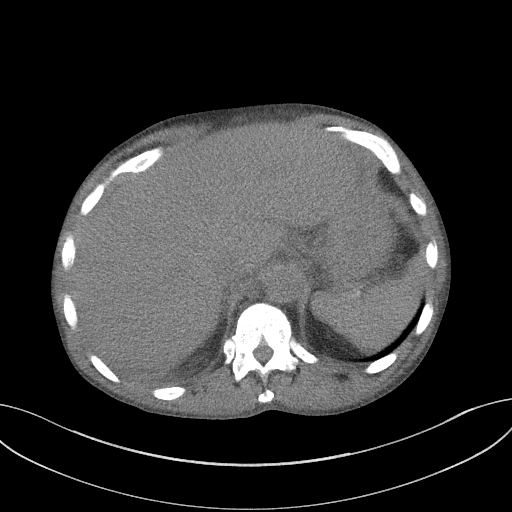
[im 83/95  soft-tissue]
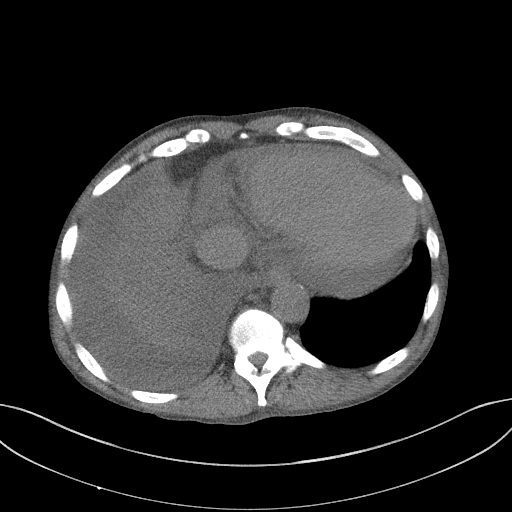
[im 91/95  soft-tissue]
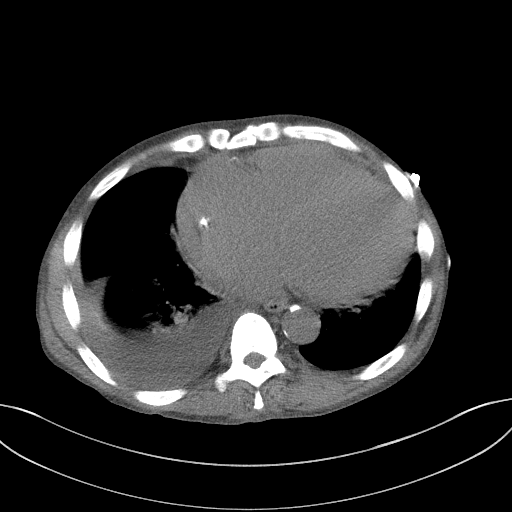

[Series 6: a/p w/o cor · coronal · non-contrast · 0.75mm/px · 3 of 151 slices shown]
[im 51/151  soft-tissue]
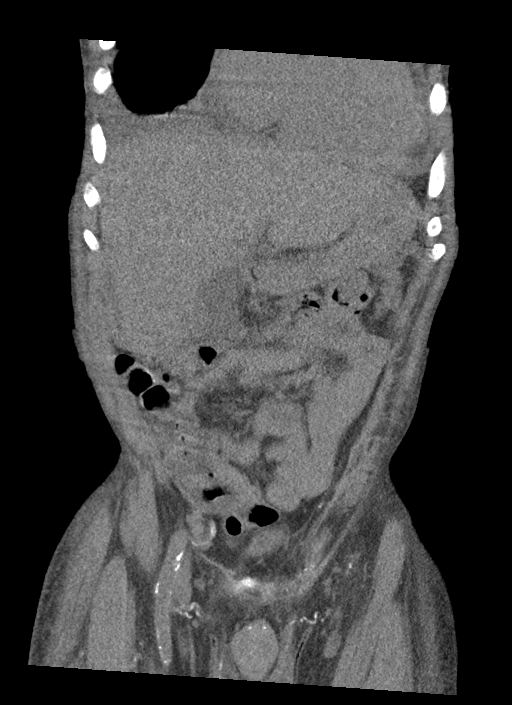
[im 67/151  soft-tissue]
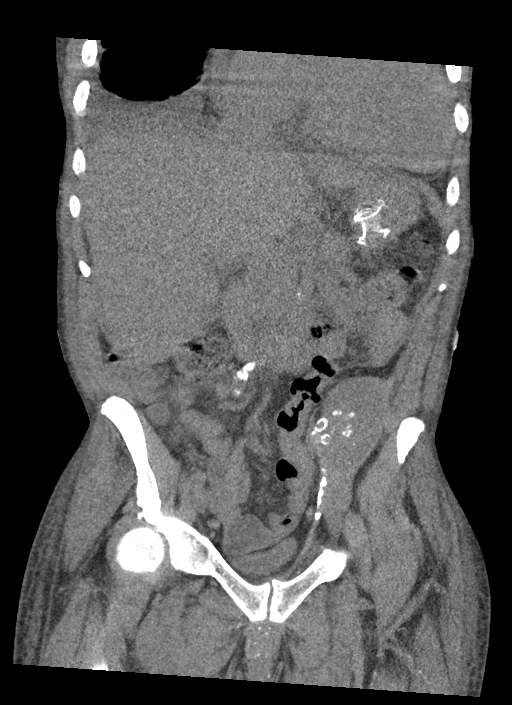
[im 84/151  soft-tissue]
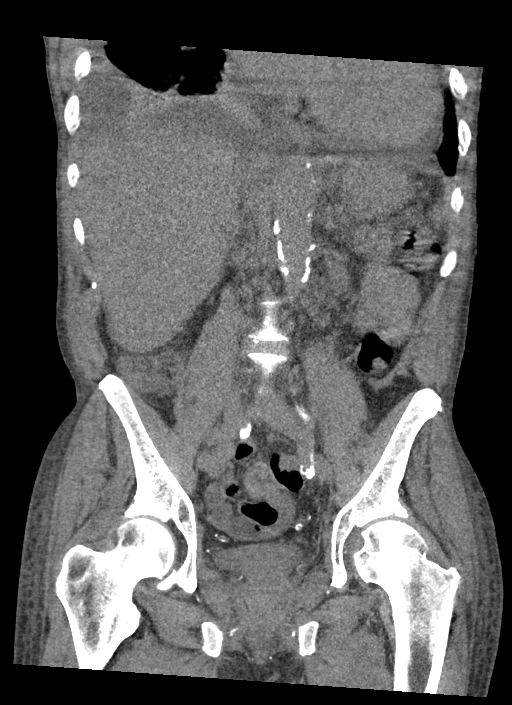

[16 of 46 positions shown; findings below may reference images not displayed]

FINDINGS: Lower chest: Newly seen moderate to large right effusion layering
dependently with dependent atelectasis. Cardiomegaly. Pericardial
fluid as seen previously.

Hepatobiliary: No liver parenchymal abnormality seen without
contrast. Question cirrhosis with relative prominence of the left
lobe. No calcified gallstones.

Pancreas: Normal

Spleen: Normal

Adrenals/Urinary Tract: Adrenal glands are normal. Chronic renal
atrophy. Redemonstration of a 3.2 cm exophytic lesion of the left
kidney which is not visibly changing over time. Nonfunctional/failed
renal transplant present in the left iliac fossa. Similar appearance
to previous studies.

Stomach/Bowel: Previously seen lesser sac fluid collection is
absent. No ascites seen elsewhere.

Vascular/Lymphatic: Aortic atherosclerosis. No aneurysm. IVC is
normal.

Reproductive: Normal

Other: No free fluid or air.Spool shaped abnormality apparently
within the gastric fundus. Nature of this is uncertain. Could this
be an ingested foreign object?

Musculoskeletal: Negative
IMPRESSION: Newly seen right pleural effusion with dependent atelectasis.
Chronic cardiomegaly and pericardial effusion.

Resolution of previously seen lesser sac fluid collection.

Apparent ingested object within the stomach which looks like a
spool. Maximal dimension is 2.8 cm

No acute abnormality otherwise identified.

## 2020-04-30 IMAGING — DX DG CHEST 2V
2 series · 2 of 2 positions shown · non-contrast
Comparison: 12/01/2017

CLINICAL DATA: Shortness of breath

EXAM:
CHEST - 2 VIEW

[x chest ap]
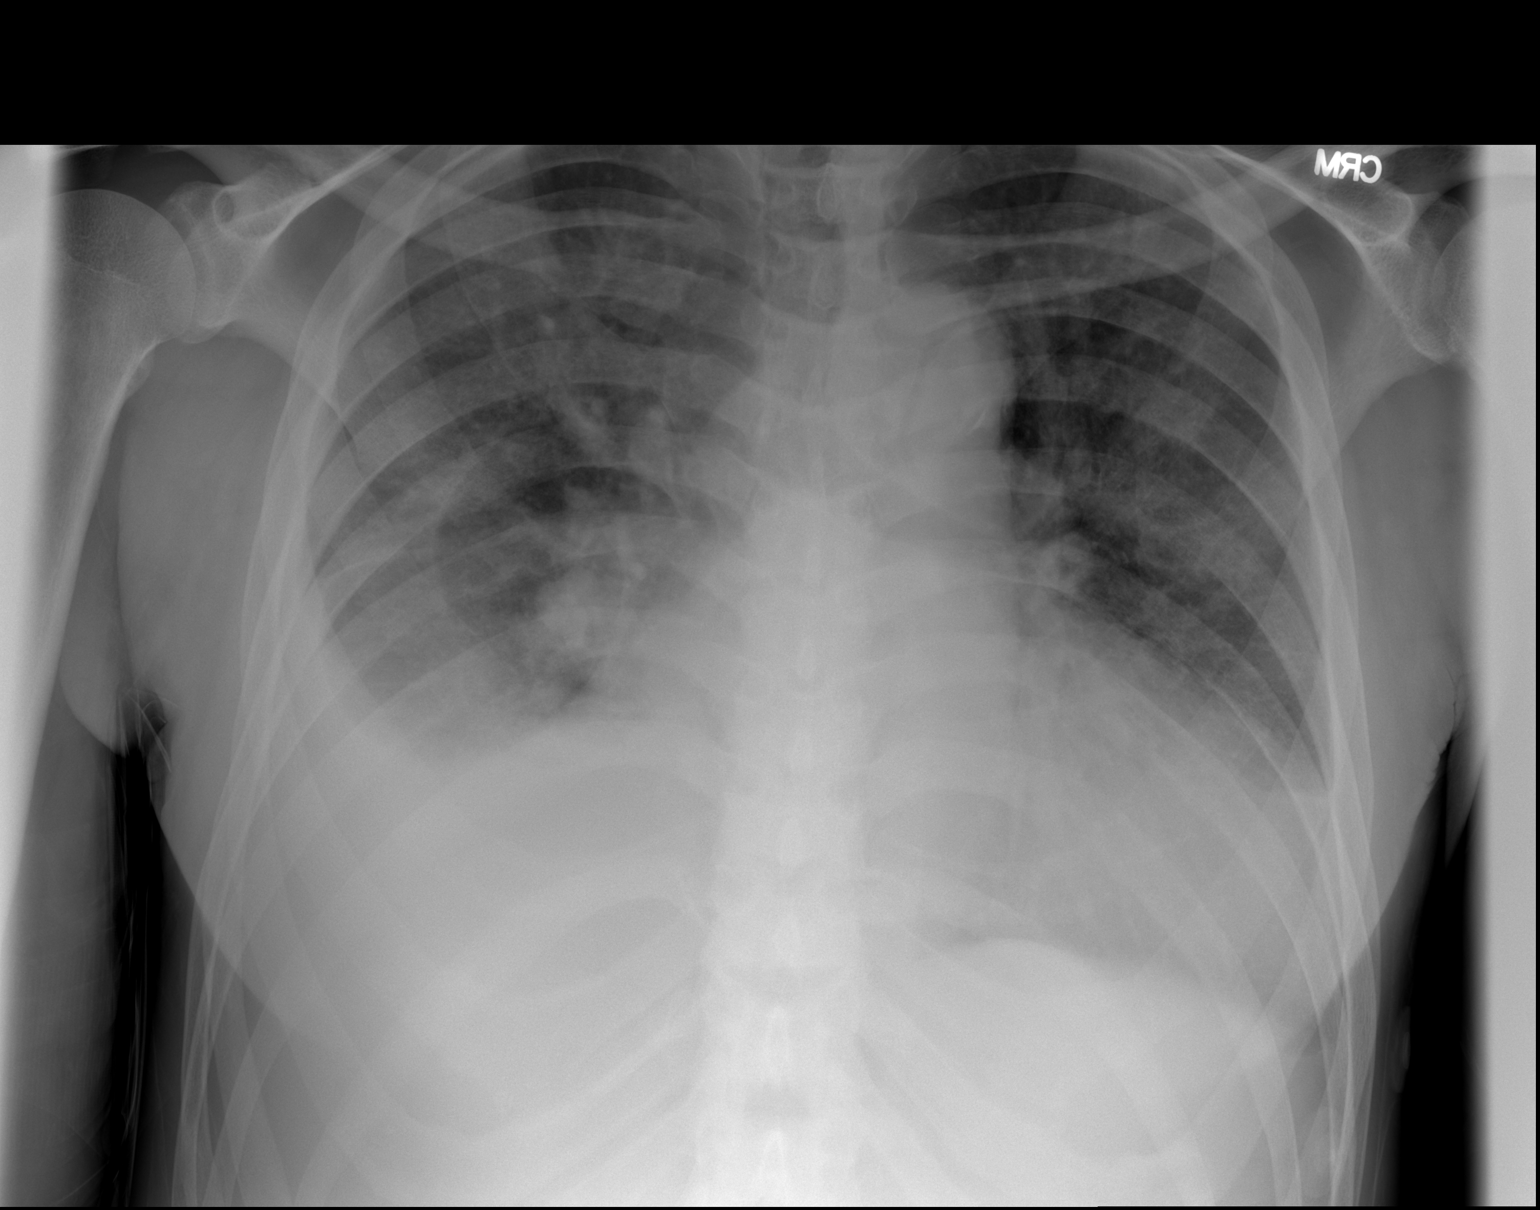

[w chest lat]
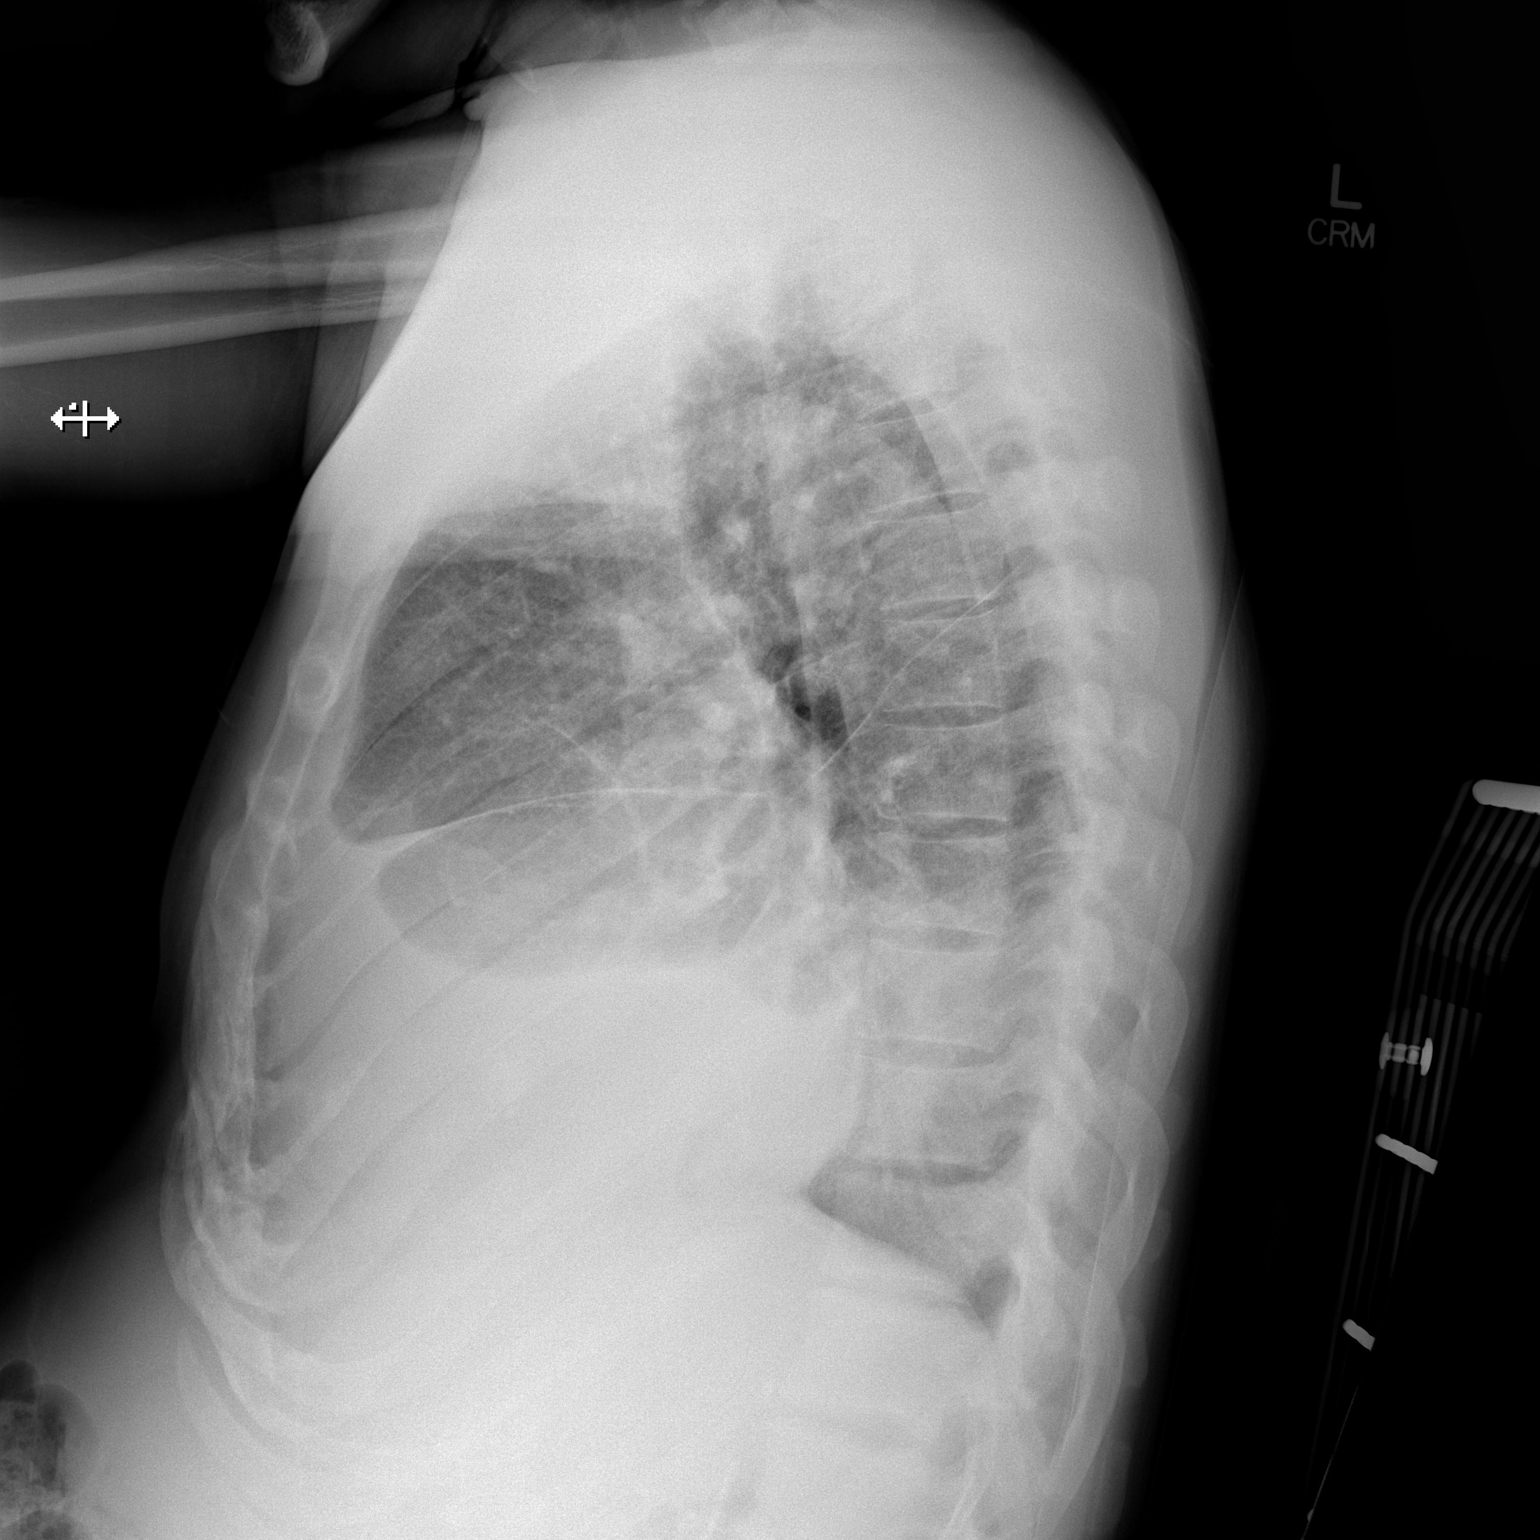

[2 of 2 positions shown; findings below may reference images not displayed]

FINDINGS: Cardiomegaly with vascular congestion. Diffuse interstitial
prominence compatible with interstitial edema. Enlarging moderate to
large right pleural effusion. Small left pleural effusion.
IMPRESSION: Continued pulmonary edema/CHF pattern. Enlarging right effusion.
Small left effusion.

## 2020-05-02 ENCOUNTER — Other Ambulatory Visit: Payer: Self-pay

## 2020-05-02 ENCOUNTER — Encounter (HOSPITAL_COMMUNITY): Payer: Self-pay | Admitting: Emergency Medicine

## 2020-05-02 ENCOUNTER — Emergency Department (HOSPITAL_COMMUNITY)
Admission: EM | Admit: 2020-05-02 | Discharge: 2020-05-02 | Disposition: A | Discharge status: Home / Self Care | Payer: Medicare Other | Attending: Emergency Medicine | Admitting: Emergency Medicine

## 2020-05-02 DIAGNOSIS — Z9981 Dependence on supplemental oxygen: Secondary | ICD-10-CM | POA: Insufficient documentation

## 2020-05-02 DIAGNOSIS — I10 Essential (primary) hypertension: Secondary | ICD-10-CM | POA: Insufficient documentation

## 2020-05-02 DIAGNOSIS — Z5321 Procedure and treatment not carried out due to patient leaving prior to being seen by health care provider: Secondary | ICD-10-CM | POA: Diagnosis not present

## 2020-05-02 LAB — CBG MONITORING, ED: Glucose-Capillary: 74 mg/dL (ref 70–99)

## 2020-05-02 NOTE — ED Notes (Signed)
Pt verbally abusive towards staff, this NT asked pt if he needed an o2 tank pt acknowledges and tells this staff that pt no longer needed an o2 tank and notified this NT that pt is going to leave. This NT notified pt that pt cannot leave with hospital o2 tank, pt then states "you dont know what you are talking about, someone gave me this o2 tank to leave on". NT denies giving pt o2 tank to leave on, this NT ask if pt needed for this NT to switch o2 tank with pt's o2 tank, pt acknowledges and pt notifies this NT that staff here "does not know what is going on here" Pt seen leaving with belongings.

## 2020-05-02 NOTE — ED Triage Notes (Signed)
Pt presents to ED POV. Pt c/o needing O2 tank. His home tank is out. No SOB or hypoxia noted. Pt placed on [place] upon arrival

## 2020-05-04 ENCOUNTER — Emergency Department (HOSPITAL_COMMUNITY): Payer: Medicare Other

## 2020-05-04 ENCOUNTER — Other Ambulatory Visit: Payer: Self-pay

## 2020-05-04 ENCOUNTER — Encounter (HOSPITAL_COMMUNITY): Payer: Self-pay | Admitting: Emergency Medicine

## 2020-05-04 ENCOUNTER — Emergency Department (HOSPITAL_COMMUNITY)
Admission: EM | Admit: 2020-05-04 | Discharge: 2020-05-05 | Disposition: A | Discharge status: Home / Self Care | Payer: Medicare Other | Attending: Emergency Medicine | Admitting: Emergency Medicine

## 2020-05-04 DIAGNOSIS — E875 Hyperkalemia: Secondary | ICD-10-CM

## 2020-05-04 DIAGNOSIS — I132 Hypertensive heart and chronic kidney disease with heart failure and with stage 5 chronic kidney disease, or end stage renal disease: Secondary | ICD-10-CM | POA: Diagnosis not present

## 2020-05-04 DIAGNOSIS — Z7901 Long term (current) use of anticoagulants: Secondary | ICD-10-CM | POA: Insufficient documentation

## 2020-05-04 DIAGNOSIS — Z87891 Personal history of nicotine dependence: Secondary | ICD-10-CM | POA: Insufficient documentation

## 2020-05-04 DIAGNOSIS — E119 Type 2 diabetes mellitus without complications: Secondary | ICD-10-CM | POA: Diagnosis not present

## 2020-05-04 DIAGNOSIS — R392 Extrarenal uremia: Secondary | ICD-10-CM | POA: Diagnosis not present

## 2020-05-04 DIAGNOSIS — N186 End stage renal disease: Secondary | ICD-10-CM | POA: Diagnosis not present

## 2020-05-04 DIAGNOSIS — J441 Chronic obstructive pulmonary disease with (acute) exacerbation: Secondary | ICD-10-CM | POA: Insufficient documentation

## 2020-05-04 DIAGNOSIS — Z79899 Other long term (current) drug therapy: Secondary | ICD-10-CM | POA: Diagnosis not present

## 2020-05-04 DIAGNOSIS — I5042 Chronic combined systolic (congestive) and diastolic (congestive) heart failure: Secondary | ICD-10-CM | POA: Diagnosis not present

## 2020-05-04 DIAGNOSIS — N19 Unspecified kidney failure: Secondary | ICD-10-CM

## 2020-05-04 DIAGNOSIS — R0602 Shortness of breath: Secondary | ICD-10-CM | POA: Diagnosis present

## 2020-05-04 DIAGNOSIS — Z992 Dependence on renal dialysis: Secondary | ICD-10-CM | POA: Diagnosis not present

## 2020-05-04 NOTE — ED Notes (Signed)
Pt to radiology via stretcher by transport.

## 2020-05-04 NOTE — ED Notes (Signed)
Pt refusing to have any lab on triage.

## 2020-05-04 NOTE — ED Notes (Signed)
MD at bedside.

## 2020-05-04 NOTE — ED Triage Notes (Signed)
Pt having multiple complains abd pain, SOB, emesis and states he needs HD. Last HD last Sunday.
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
## 2020-05-05 LAB — COMPREHENSIVE METABOLIC PANEL
ALT: 13 U/L (ref 0–44)
AST: 17 U/L (ref 15–41)
Albumin: 3 g/dL — ABNORMAL LOW (ref 3.5–5.0)
Alkaline Phosphatase: 176 U/L — ABNORMAL HIGH (ref 38–126)
Anion gap: 14 (ref 5–15)
BUN: 57 mg/dL — ABNORMAL HIGH (ref 6–20)
CO2: 25 mmol/L (ref 22–32)
Calcium: 8.9 mg/dL (ref 8.9–10.3)
Chloride: 100 mmol/L (ref 98–111)
Creatinine, Ser: 12.08 mg/dL — ABNORMAL HIGH (ref 0.61–1.24)
GFR, Estimated: 4 mL/min — ABNORMAL LOW (ref >60)
Glucose, Bld: 77 mg/dL (ref 70–99)
Potassium: 6 mmol/L — ABNORMAL HIGH (ref 3.5–5.1)
Sodium: 139 mmol/L (ref 135–145)
Total Bilirubin: 1 mg/dL (ref 0.3–1.2)
Total Protein: 7.7 g/dL (ref 6.5–8.1)

## 2020-05-05 LAB — CBC WITH DIFFERENTIAL/PLATELET
Abs Immature Granulocytes: 0.04 10*3/uL (ref 0.00–0.07)
Basophils Absolute: 0 10*3/uL (ref 0.0–0.1)
Basophils Relative: 1 %
Eosinophils Absolute: 0.3 10*3/uL (ref 0.0–0.5)
Eosinophils Relative: 7 %
HCT: 29.1 % — ABNORMAL LOW (ref 39.0–52.0)
Hemoglobin: 9 g/dL — ABNORMAL LOW (ref 13.0–17.0)
Immature Granulocytes: 1 %
Lymphocytes Relative: 14 %
Lymphs Abs: 0.5 10*3/uL — ABNORMAL LOW (ref 0.7–4.0)
MCH: 25.7 pg — ABNORMAL LOW (ref 26.0–34.0)
MCHC: 30.9 g/dL (ref 30.0–36.0)
MCV: 83.1 fL (ref 80.0–100.0)
Monocytes Absolute: 0.4 10*3/uL (ref 0.1–1.0)
Monocytes Relative: 12 %
Neutro Abs: 2.3 10*3/uL (ref 1.7–7.7)
Neutrophils Relative %: 65 %
Platelets: 167 10*3/uL (ref 150–400)
RBC: 3.5 MIL/uL — ABNORMAL LOW (ref 4.22–5.81)
RDW: 19.8 % — ABNORMAL HIGH (ref 11.5–15.5)
WBC: 3.6 10*3/uL — ABNORMAL LOW (ref 4.0–10.5)
nRBC: 0 % (ref 0.0–0.2)

## 2020-05-05 LAB — CBG MONITORING, ED: Glucose-Capillary: 100 mg/dL — ABNORMAL HIGH (ref 70–99)

## 2020-05-05 LAB — HEPATITIS B CORE ANTIBODY, TOTAL: Hep B Core Total Ab: NONREACTIVE

## 2020-05-05 LAB — HEPATITIS B SURFACE ANTIGEN: Hepatitis B Surface Ag: NONREACTIVE

## 2020-05-05 LAB — HEPATITIS B SURFACE ANTIBODY,QUALITATIVE: Hep B S Ab: REACTIVE — AB

## 2020-05-05 MED ORDER — LIDOCAINE-PRILOCAINE 2.5-2.5 % EX CREA
1.0000 "application " | TOPICAL_CREAM | CUTANEOUS | Status: DC | PRN
  Filled 2020-05-05: qty 5

## 2020-05-05 MED ORDER — PROMETHAZINE HCL 25 MG/ML IJ SOLN
25.0000 mg | Freq: Once | INTRAMUSCULAR | Status: AC
  Administered 2020-05-05: 25 mg via INTRAVENOUS
  Filled 2020-05-05: qty 1

## 2020-05-05 MED ORDER — ALTEPLASE 2 MG IJ SOLR: 2.0000 mg | Freq: Once | INTRAMUSCULAR | Status: DC | PRN

## 2020-05-05 MED ORDER — METOCLOPRAMIDE HCL 5 MG/ML IJ SOLN
5.0000 mg | Freq: Once | INTRAMUSCULAR | Status: AC
  Administered 2020-05-05: 5 mg via INTRAVENOUS
  Filled 2020-05-05: qty 2

## 2020-05-05 MED ORDER — ONDANSETRON 4 MG PO TBDP
4.0000 mg | ORAL_TABLET | Freq: Once | ORAL | Status: DC
  Filled 2020-05-05: qty 1

## 2020-05-05 MED ORDER — SODIUM CHLORIDE 0.9 % IV SOLN: 100.0000 mL | INTRAVENOUS | Status: DC | PRN

## 2020-05-05 MED ORDER — HEPARIN SODIUM (PORCINE) 1000 UNIT/ML DIALYSIS
1000.0000 [IU] | INTRAMUSCULAR | Status: DC | PRN
  Filled 2020-05-05: qty 1

## 2020-05-05 MED ORDER — PENTAFLUOROPROP-TETRAFLUOROETH EX AERO
1.0000 "application " | INHALATION_SPRAY | CUTANEOUS | Status: DC | PRN
  Filled 2020-05-05: qty 30

## 2020-05-05 MED ORDER — OXYCODONE HCL 5 MG PO TABS
15.0000 mg | ORAL_TABLET | Freq: Once | ORAL | Status: AC
  Administered 2020-05-05: 15 mg via ORAL
  Filled 2020-05-05 (×2): qty 3

## 2020-05-05 MED ORDER — LIDOCAINE HCL (PF) 1 % IJ SOLN: 5.0000 mL | INTRAMUSCULAR | Status: DC | PRN

## 2020-05-05 MED ORDER — CHLORHEXIDINE GLUCONATE CLOTH 2 % EX PADS: 6.0000 | MEDICATED_PAD | Freq: Every day | CUTANEOUS | Status: DC

## 2020-05-05 MED ORDER — HEPARIN SODIUM (PORCINE) 1000 UNIT/ML IJ SOLN
INTRAMUSCULAR | Status: AC
  Filled 2020-05-05: qty 1

## 2020-05-05 NOTE — ED Notes (Signed)
Received a call from lab, specimen for CMP hemolyzed.

## 2020-05-05 NOTE — ED Notes (Signed)
Called lab to check on CMP, lab reports that sample was hemolyzed and will have to be redrawn.

## 2020-05-05 NOTE — ED Provider Notes (Signed)
  Physical Exam  BP (!) 207/84 (BP Location: Left Leg)   Pulse 89   Temp (!) 97.5 F (36.4 C) (Oral)   Resp 18   Wt 83 kg Comment: bed weight   SpO2 94%   BMI 23.18 kg/m   Physical Exam  ED Course/Procedures     Procedures  MDM  Patient just came back from dialysis.  His potassium was 6.0 before dialysis.  Patient feeling better now.  No oxygen requirement.  Stable for discharge.    Drenda Freeze, MD 05/05/20 [phone]

## 2020-05-05 NOTE — ED Notes (Signed)
Upon this RN entering the room, patient demanding pain medication and to know his lab results. Pt advised that no orders for pain meds have been placed at this time and that the provider will go over his labs once they have all resulted. Pt continues to state that he has to have something for his back and abdominal pain.

## 2020-05-05 NOTE — ED Notes (Signed)
Patient provided multiple warm blankets and previously made aware of plan of care, however, continues to call out for pain medication and asking to see this RN's credentials.

## 2020-05-05 NOTE — ED Provider Notes (Signed)
Nephrology coordinating dialysis. Physical Exam  BP (!) 174/55   Pulse 70   Temp 98.2 F (36.8 C) (Oral)   Resp 19   SpO2 100%   Physical Exam  ED Course/Procedures     Procedures  MDM  Patient request pain and nausea medications while awaiting dialysis.  Alert without respiratory distress.       Charlesetta Shanks, MD 05/06/20 2008

## 2020-05-05 NOTE — ED Notes (Signed)
Pt called out again asking for pain medication and nausea medication.

## 2020-05-05 NOTE — Discharge Instructions (Addendum)
Please go to dialysis as scheduled   See your doctor and nephrologist   Return to ER if you have worse shortness of breath, weakness, fever, chest pain

## 2020-05-07 ENCOUNTER — Encounter (HOSPITAL_BASED_OUTPATIENT_CLINIC_OR_DEPARTMENT_OTHER): Payer: Self-pay | Admitting: Emergency Medicine

## 2020-05-07 ENCOUNTER — Emergency Department (HOSPITAL_BASED_OUTPATIENT_CLINIC_OR_DEPARTMENT_OTHER): Payer: Medicare Other

## 2020-05-07 ENCOUNTER — Emergency Department (HOSPITAL_BASED_OUTPATIENT_CLINIC_OR_DEPARTMENT_OTHER)
Admission: EM | Admit: 2020-05-07 | Discharge: 2020-05-07 | Disposition: A | Discharge status: Home / Self Care | Payer: Medicare Other | Attending: Emergency Medicine | Admitting: Emergency Medicine

## 2020-05-07 ENCOUNTER — Other Ambulatory Visit: Payer: Self-pay

## 2020-05-07 DIAGNOSIS — I5033 Acute on chronic diastolic (congestive) heart failure: Secondary | ICD-10-CM | POA: Diagnosis not present

## 2020-05-07 DIAGNOSIS — G894 Chronic pain syndrome: Secondary | ICD-10-CM | POA: Diagnosis not present

## 2020-05-07 DIAGNOSIS — J441 Chronic obstructive pulmonary disease with (acute) exacerbation: Secondary | ICD-10-CM | POA: Insufficient documentation

## 2020-05-07 DIAGNOSIS — Z992 Dependence on renal dialysis: Secondary | ICD-10-CM | POA: Diagnosis not present

## 2020-05-07 DIAGNOSIS — Z87891 Personal history of nicotine dependence: Secondary | ICD-10-CM | POA: Insufficient documentation

## 2020-05-07 DIAGNOSIS — I132 Hypertensive heart and chronic kidney disease with heart failure and with stage 5 chronic kidney disease, or end stage renal disease: Secondary | ICD-10-CM | POA: Diagnosis not present

## 2020-05-07 DIAGNOSIS — N186 End stage renal disease: Secondary | ICD-10-CM | POA: Insufficient documentation

## 2020-05-07 DIAGNOSIS — Z79899 Other long term (current) drug therapy: Secondary | ICD-10-CM | POA: Diagnosis not present

## 2020-05-07 DIAGNOSIS — Z7901 Long term (current) use of anticoagulants: Secondary | ICD-10-CM | POA: Diagnosis not present

## 2020-05-07 DIAGNOSIS — R0789 Other chest pain: Secondary | ICD-10-CM | POA: Diagnosis present

## 2020-05-07 LAB — CBC
HCT: 29.9 % — ABNORMAL LOW (ref 39.0–52.0)
Hemoglobin: 9.3 g/dL — ABNORMAL LOW (ref 13.0–17.0)
MCH: 26.1 pg (ref 26.0–34.0)
MCHC: 31.1 g/dL (ref 30.0–36.0)
MCV: 84 fL (ref 80.0–100.0)
Platelets: 117 10*3/uL — ABNORMAL LOW (ref 150–400)
RBC: 3.56 MIL/uL — ABNORMAL LOW (ref 4.22–5.81)
RDW: 19.5 % — ABNORMAL HIGH (ref 11.5–15.5)
WBC: 4.1 10*3/uL (ref 4.0–10.5)
nRBC: 0 % (ref 0.0–0.2)

## 2020-05-07 LAB — BASIC METABOLIC PANEL
Anion gap: 11 (ref 5–15)
BUN: 47 mg/dL — ABNORMAL HIGH (ref 6–20)
CO2: 26 mmol/L (ref 22–32)
Calcium: 8.7 mg/dL — ABNORMAL LOW (ref 8.9–10.3)
Chloride: 101 mmol/L (ref 98–111)
Creatinine, Ser: 9.24 mg/dL — ABNORMAL HIGH (ref 0.61–1.24)
GFR, Estimated: 6 mL/min — ABNORMAL LOW (ref >60)
Glucose, Bld: 84 mg/dL (ref 70–99)
Potassium: 5.5 mmol/L — ABNORMAL HIGH (ref 3.5–5.1)
Sodium: 138 mmol/L (ref 135–145)

## 2020-05-07 LAB — TROPONIN I (HIGH SENSITIVITY)
Troponin I (High Sensitivity): 26 ng/L — ABNORMAL HIGH (ref <18)
Troponin I (High Sensitivity): 26 ng/L — ABNORMAL HIGH (ref <18)

## 2020-05-07 MED ORDER — ONDANSETRON HCL 4 MG/2ML IJ SOLN
4.0000 mg | Freq: Once | INTRAMUSCULAR | Status: AC
  Administered 2020-05-07: 4 mg via INTRAVENOUS
  Filled 2020-05-07: qty 2

## 2020-05-07 MED ORDER — OXYCODONE HCL 5 MG PO TABS
10.0000 mg | ORAL_TABLET | Freq: Once | ORAL | Status: DC
  Filled 2020-05-07: qty 2

## 2020-05-07 MED ORDER — OXYCODONE HCL ER 10 MG PO T12A
10.0000 mg | EXTENDED_RELEASE_TABLET | Freq: Two times a day (BID) | ORAL | Status: DC
  Filled 2020-05-07: qty 1

## 2020-05-07 NOTE — ED Notes (Signed)
Pt refusing PO pain medication d/t nausea. Informed pt he would be getting it with zofran. Reports that this makes his nausea worse. MD notified.

## 2020-05-07 NOTE — ED Triage Notes (Signed)
C/o right sided chest pain that started today.  Reports he had dialysis yesterday.  Also c/o abdomen being swollen.

## 2020-05-07 NOTE — ED Notes (Signed)
MD at bedside. 

## 2020-05-07 NOTE — ED Notes (Addendum)
Refuses DC vitals. Ambulatory to bathroom with O2 tank. Clean pants provided. IV removed. Follow up care discussed.

## 2020-05-07 NOTE — ED Notes (Signed)
Pt reports no relief from nausea medication. Still refuses PO oxycodone. MD notified.
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
## 2020-05-08 ENCOUNTER — Ambulatory Visit (HOSPITAL_COMMUNITY): Payer: Self-pay | Admitting: Licensed Clinical Social Worker

## 2020-05-11 ENCOUNTER — Ambulatory Visit (HOSPITAL_COMMUNITY): Payer: Self-pay | Admitting: Clinical

## 2020-05-14 ENCOUNTER — Emergency Department (HOSPITAL_COMMUNITY): Payer: Medicare Other

## 2020-05-14 ENCOUNTER — Encounter (HOSPITAL_COMMUNITY): Payer: Self-pay | Admitting: Emergency Medicine

## 2020-05-14 ENCOUNTER — Other Ambulatory Visit: Payer: Self-pay

## 2020-05-14 ENCOUNTER — Emergency Department (HOSPITAL_COMMUNITY)
Admission: EM | Admit: 2020-05-14 | Discharge: 2020-05-14 | Disposition: A | Discharge status: Home / Self Care | Payer: Medicare Other | Attending: Emergency Medicine | Admitting: Emergency Medicine

## 2020-05-14 DIAGNOSIS — I132 Hypertensive heart and chronic kidney disease with heart failure and with stage 5 chronic kidney disease, or end stage renal disease: Secondary | ICD-10-CM | POA: Diagnosis not present

## 2020-05-14 DIAGNOSIS — Z7951 Long term (current) use of inhaled steroids: Secondary | ICD-10-CM | POA: Diagnosis not present

## 2020-05-14 DIAGNOSIS — E1122 Type 2 diabetes mellitus with diabetic chronic kidney disease: Secondary | ICD-10-CM | POA: Insufficient documentation

## 2020-05-14 DIAGNOSIS — M545 Low back pain, unspecified: Secondary | ICD-10-CM | POA: Diagnosis not present

## 2020-05-14 DIAGNOSIS — N2889 Other specified disorders of kidney and ureter: Secondary | ICD-10-CM | POA: Diagnosis not present

## 2020-05-14 DIAGNOSIS — G8929 Other chronic pain: Secondary | ICD-10-CM | POA: Insufficient documentation

## 2020-05-14 DIAGNOSIS — T1490XA Injury, unspecified, initial encounter: Secondary | ICD-10-CM

## 2020-05-14 DIAGNOSIS — N186 End stage renal disease: Secondary | ICD-10-CM | POA: Insufficient documentation

## 2020-05-14 DIAGNOSIS — J441 Chronic obstructive pulmonary disease with (acute) exacerbation: Secondary | ICD-10-CM | POA: Insufficient documentation

## 2020-05-14 DIAGNOSIS — I5042 Chronic combined systolic (congestive) and diastolic (congestive) heart failure: Secondary | ICD-10-CM | POA: Insufficient documentation

## 2020-05-14 DIAGNOSIS — Z79899 Other long term (current) drug therapy: Secondary | ICD-10-CM | POA: Insufficient documentation

## 2020-05-14 DIAGNOSIS — R112 Nausea with vomiting, unspecified: Secondary | ICD-10-CM | POA: Diagnosis not present

## 2020-05-14 DIAGNOSIS — Z87891 Personal history of nicotine dependence: Secondary | ICD-10-CM | POA: Insufficient documentation

## 2020-05-14 DIAGNOSIS — Z992 Dependence on renal dialysis: Secondary | ICD-10-CM | POA: Diagnosis not present

## 2020-05-14 DIAGNOSIS — R109 Unspecified abdominal pain: Secondary | ICD-10-CM | POA: Diagnosis not present

## 2020-05-14 DIAGNOSIS — M549 Dorsalgia, unspecified: Secondary | ICD-10-CM | POA: Diagnosis present

## 2020-05-14 LAB — CBC WITH DIFFERENTIAL/PLATELET
Abs Immature Granulocytes: 0.02 10*3/uL (ref 0.00–0.07)
Basophils Absolute: 0 10*3/uL (ref 0.0–0.1)
Basophils Relative: 1 %
Eosinophils Absolute: 0.3 10*3/uL (ref 0.0–0.5)
Eosinophils Relative: 8 %
HCT: 30.7 % — ABNORMAL LOW (ref 39.0–52.0)
Hemoglobin: 9.3 g/dL — ABNORMAL LOW (ref 13.0–17.0)
Immature Granulocytes: 1 %
Lymphocytes Relative: 14 %
Lymphs Abs: 0.5 10*3/uL — ABNORMAL LOW (ref 0.7–4.0)
MCH: 25.7 pg — ABNORMAL LOW (ref 26.0–34.0)
MCHC: 30.3 g/dL (ref 30.0–36.0)
MCV: 84.8 fL (ref 80.0–100.0)
Monocytes Absolute: 0.5 10*3/uL (ref 0.1–1.0)
Monocytes Relative: 13 %
Neutro Abs: 2.2 10*3/uL (ref 1.7–7.7)
Neutrophils Relative %: 63 %
Platelets: 143 10*3/uL — ABNORMAL LOW (ref 150–400)
RBC: 3.62 MIL/uL — ABNORMAL LOW (ref 4.22–5.81)
RDW: 19.6 % — ABNORMAL HIGH (ref 11.5–15.5)
WBC: 3.4 10*3/uL — ABNORMAL LOW (ref 4.0–10.5)
nRBC: 0 % (ref 0.0–0.2)

## 2020-05-14 LAB — COMPREHENSIVE METABOLIC PANEL
ALT: 8 U/L (ref 0–44)
AST: 19 U/L (ref 15–41)
Albumin: 3 g/dL — ABNORMAL LOW (ref 3.5–5.0)
Alkaline Phosphatase: 206 U/L — ABNORMAL HIGH (ref 38–126)
Anion gap: 10 (ref 5–15)
BUN: 42 mg/dL — ABNORMAL HIGH (ref 6–20)
CO2: 27 mmol/L (ref 22–32)
Calcium: 8.6 mg/dL — ABNORMAL LOW (ref 8.9–10.3)
Chloride: 100 mmol/L (ref 98–111)
Creatinine, Ser: 8.13 mg/dL — ABNORMAL HIGH (ref 0.61–1.24)
GFR, Estimated: 7 mL/min — ABNORMAL LOW (ref >60)
Glucose, Bld: 74 mg/dL (ref 70–99)
Potassium: 4.6 mmol/L (ref 3.5–5.1)
Sodium: 137 mmol/L (ref 135–145)
Total Bilirubin: 0.5 mg/dL (ref 0.3–1.2)
Total Protein: 7.6 g/dL (ref 6.5–8.1)

## 2020-05-14 LAB — LIPASE, BLOOD: Lipase: 48 U/L (ref 11–51)

## 2020-05-14 MED ORDER — MORPHINE SULFATE (PF) 4 MG/ML IV SOLN
4.0000 mg | Freq: Once | INTRAVENOUS | Status: AC
  Administered 2020-05-14: 4 mg via INTRAVENOUS
  Filled 2020-05-14: qty 1

## 2020-05-14 MED ORDER — ONDANSETRON HCL 4 MG/2ML IJ SOLN
4.0000 mg | Freq: Once | INTRAMUSCULAR | Status: AC
  Administered 2020-05-14: 4 mg via INTRAVENOUS
  Filled 2020-05-14: qty 2
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
## 2020-05-14 NOTE — ED Notes (Signed)
Pt verbally abusive to this RN and other staff who have rounded on pt. This RN checking full set of VS prior to morphine administration and pt became angry stating "Just give me the pain meds... I have been coming here for 23 years and never had to wait for my pain meds." This RN therapeutically explained to pt the need to have a BP documented just prior to admin to ensure adequate SBP, due to possibility of drop in BP after morphine admin, and that this was for his safety. Pt stated this RN did not know how to do her job.

## 2020-05-14 NOTE — ED Notes (Signed)
Pt ambulated around the room and tolerated well. Pt states he feels well and would like more pain medicine, per provider, prior to his d/c.

## 2020-05-14 NOTE — ED Notes (Signed)
Pt made aware that blood work needs to be drawn and will be performed with IV insertion. Pt does not want hand stuck due to scar like area on hand and RT forearm.

## 2020-05-14 NOTE — ED Notes (Signed)
Patient states iv team is coming to get labs.refused lab tech.nurse is awear of it

## 2020-05-14 NOTE — ED Notes (Signed)
Pt requesting this RN stick pt in chest where "I have been stuck numerous times for pain meds". This RN stated that the chest will not be used for PIV and limb will be appropriate. Pt asked if hand or upper arm have been used as it is documented and pt became angry stating "don't look at what's in the past, that doesn't affect how you take care of me today." Pt upset stating this RN incompetent due to not starting PIV on Pt rt chest. Awaiting IV team.

## 2020-05-14 NOTE — ED Notes (Signed)
This RN attempting to assess pt and asking follow up triage questions and pt became angry stating "I don't know! Look at my chart. I don't know nothing!". This RN asking pt about LT limb and if able to use for BP and sticks and pt raised hand to RN and stated "stop, ask the doctor." This RN explained why these questions are being asked and that what we do is for pt's safety. Pt uncooperative and giving this RN a difficult time. Pt refusing to answer any other questions so unable to screen pt any longer.

## 2020-05-14 NOTE — ED Notes (Signed)
Pt given 8 oz apple juice

## 2020-05-14 NOTE — ED Triage Notes (Signed)
Pt c/o R sided back pain with n/v all day yesterday and tonight.

## 2020-05-14 NOTE — Discharge Instructions (Signed)
Follow-up with your primary care doctor.  You have a mass on your kidney.  This needs further outpatient follow-up and should be reevaluated by your primary care doctor.  Return here as needed if you have any worsening symptoms.

## 2020-05-17 ENCOUNTER — Other Ambulatory Visit: Payer: Self-pay

## 2020-05-17 ENCOUNTER — Encounter (HOSPITAL_COMMUNITY): Payer: Self-pay | Admitting: Emergency Medicine

## 2020-05-17 ENCOUNTER — Emergency Department (HOSPITAL_COMMUNITY)
Admission: EM | Admit: 2020-05-17 | Discharge: 2020-05-17 | Disposition: A | Discharge status: Home / Self Care | Payer: Medicare Other | Attending: Emergency Medicine | Admitting: Emergency Medicine

## 2020-05-17 DIAGNOSIS — Z992 Dependence on renal dialysis: Secondary | ICD-10-CM | POA: Diagnosis not present

## 2020-05-17 DIAGNOSIS — G894 Chronic pain syndrome: Secondary | ICD-10-CM

## 2020-05-17 DIAGNOSIS — Z20822 Contact with and (suspected) exposure to covid-19: Secondary | ICD-10-CM | POA: Insufficient documentation

## 2020-05-17 DIAGNOSIS — M545 Low back pain, unspecified: Secondary | ICD-10-CM | POA: Insufficient documentation

## 2020-05-17 DIAGNOSIS — I132 Hypertensive heart and chronic kidney disease with heart failure and with stage 5 chronic kidney disease, or end stage renal disease: Secondary | ICD-10-CM | POA: Diagnosis not present

## 2020-05-17 DIAGNOSIS — N186 End stage renal disease: Secondary | ICD-10-CM | POA: Insufficient documentation

## 2020-05-17 DIAGNOSIS — Z87891 Personal history of nicotine dependence: Secondary | ICD-10-CM | POA: Diagnosis not present

## 2020-05-17 DIAGNOSIS — J441 Chronic obstructive pulmonary disease with (acute) exacerbation: Secondary | ICD-10-CM | POA: Diagnosis not present

## 2020-05-17 DIAGNOSIS — I5042 Chronic combined systolic (congestive) and diastolic (congestive) heart failure: Secondary | ICD-10-CM | POA: Diagnosis not present

## 2020-05-17 DIAGNOSIS — R1110 Vomiting, unspecified: Secondary | ICD-10-CM | POA: Insufficient documentation

## 2020-05-17 DIAGNOSIS — Z79899 Other long term (current) drug therapy: Secondary | ICD-10-CM | POA: Insufficient documentation

## 2020-05-17 DIAGNOSIS — R109 Unspecified abdominal pain: Secondary | ICD-10-CM | POA: Diagnosis present

## 2020-05-17 LAB — RENAL FUNCTION PANEL
Albumin: 3 g/dL — ABNORMAL LOW (ref 3.5–5.0)
Anion gap: 13 (ref 5–15)
BUN: 61 mg/dL — ABNORMAL HIGH (ref 6–20)
CO2: 23 mmol/L (ref 22–32)
Calcium: 8.3 mg/dL — ABNORMAL LOW (ref 8.9–10.3)
Chloride: 101 mmol/L (ref 98–111)
Creatinine, Ser: 10.44 mg/dL — ABNORMAL HIGH (ref 0.61–1.24)
GFR, Estimated: 5 mL/min — ABNORMAL LOW (ref >60)
Glucose, Bld: 102 mg/dL — ABNORMAL HIGH (ref 70–99)
Phosphorus: 5.5 mg/dL — ABNORMAL HIGH (ref 2.5–4.6)
Potassium: 4.9 mmol/L (ref 3.5–5.1)
Sodium: 137 mmol/L (ref 135–145)

## 2020-05-17 LAB — CBC
HCT: 28.6 % — ABNORMAL LOW (ref 39.0–52.0)
Hemoglobin: 8.6 g/dL — ABNORMAL LOW (ref 13.0–17.0)
MCH: 25.6 pg — ABNORMAL LOW (ref 26.0–34.0)
MCHC: 30.1 g/dL (ref 30.0–36.0)
MCV: 85.1 fL (ref 80.0–100.0)
Platelets: 118 10*3/uL — ABNORMAL LOW (ref 150–400)
RBC: 3.36 MIL/uL — ABNORMAL LOW (ref 4.22–5.81)
RDW: 19.5 % — ABNORMAL HIGH (ref 11.5–15.5)
WBC: 2.5 10*3/uL — ABNORMAL LOW (ref 4.0–10.5)
nRBC: 0 % (ref 0.0–0.2)

## 2020-05-17 LAB — CBG MONITORING, ED: Glucose-Capillary: 73 mg/dL (ref 70–99)

## 2020-05-17 LAB — RESPIRATORY PANEL BY RT PCR (FLU A&B, COVID)
Influenza A by PCR: NEGATIVE
Influenza B by PCR: NEGATIVE
SARS Coronavirus 2 by RT PCR: NEGATIVE

## 2020-05-17 MED ORDER — CHLORHEXIDINE GLUCONATE CLOTH 2 % EX PADS: 6.0000 | MEDICATED_PAD | Freq: Every day | CUTANEOUS | Status: DC

## 2020-05-17 MED ORDER — HEPARIN SODIUM (PORCINE) 1000 UNIT/ML IJ SOLN
INTRAMUSCULAR | Status: AC
  Administered 2020-05-17: 1000 [IU]
  Filled 2020-05-17: qty 1

## 2020-05-17 MED ORDER — NEPRO/CARBSTEADY PO LIQD
237.0000 mL | Freq: Three times a day (TID) | ORAL | Status: DC
  Filled 2020-05-17: qty 237

## 2020-05-17 MED ORDER — ONDANSETRON 4 MG PO TBDP
8.0000 mg | ORAL_TABLET | Freq: Once | ORAL | Status: AC
  Administered 2020-05-17: 8 mg via ORAL
  Filled 2020-05-17: qty 2

## 2020-05-17 MED ORDER — OXYCODONE HCL 5 MG PO TABS
15.0000 mg | ORAL_TABLET | Freq: Once | ORAL | Status: AC
  Administered 2020-05-17: 15 mg via ORAL
  Filled 2020-05-17: qty 3

## 2020-05-17 NOTE — ED Notes (Signed)
Patient returned from dialysis, this RN assumed care of patient and patient stated during bedside handoff that he did not wish to stay any longer. Patient refusing all vitals signs or assessment from this RN. Patient alert and oriented x 4 and ambulating with steady gait out of department.

## 2020-05-17 NOTE — ED Provider Notes (Signed)
  Physical Exam  BP (!) 206/87   Pulse 69   Temp (S) (!) 96.1 F (35.6 C) (Oral)   Resp 18   Wt 83.9 kg   SpO2 96%   BMI 23.75 kg/m   Physical Exam  ED Course/Procedures   Clinical Course as of May 17 2001  Wed May 17, 2020  1216 Normal   [EW]  1216 Normal  CBG monitoring, ED [EW]    Clinical Course User Index [EW] Daleen Bo, MD    Procedures  MDM  Patient went up to dialysis then back down.  Patient supposed to be reassessed after dialysis but left prior to being seen.  Nurse notified me that he has already left the building.   Drenda Freeze, MD 05/17/20 2002

## 2020-05-17 NOTE — ED Notes (Addendum)
Oral temp would not read on patient, however patient wanted to hold thermometer himself. axillary temp read 94.4 and still climbing before patient insisted that this RN take thermometer out from arm and have him go to dialysis. Pt's skin did not feel abnormally cold, nor did he show any outward signs of hypothermia.  Dialysis RN aware.

## 2020-05-17 NOTE — ED Triage Notes (Signed)
To ED with c/o "needs dialysis" last treatment was Friday-- legs swollen to knees- tight, has been on O2 at home at 2l/m/[place]--  sats on RA = 90% on 2L/m/[place] 98%--  Also c/o vomiting/back pain

## 2020-05-17 NOTE — ED Notes (Signed)
Refusing temp

## 2020-05-17 NOTE — Progress Notes (Signed)
Asked to see for hospital dialysis.  Pt involuntarily dc'd from [facility] dialysis on 04/23/20 for breach of behavior contract.  Per ED does not require hosp admission. Plan HD upstairs, needs covid swab.   Kelly Splinter, MD 05/17/2020, 9:03 AM

## 2020-05-17 NOTE — Procedures (Signed)
   I was present at this dialysis session, have reviewed the session itself and made  appropriate changes Kelly Splinter MD [place] pager [phone]   05/17/2020, 4:30 PM
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
## 2020-05-22 ENCOUNTER — Encounter (HOSPITAL_COMMUNITY): Payer: Self-pay

## 2020-05-22 ENCOUNTER — Emergency Department (HOSPITAL_COMMUNITY)
Admission: EM | Admit: 2020-05-22 | Discharge: 2020-05-23 | Disposition: A | Discharge status: Home / Self Care | Payer: Medicare Other | Attending: Emergency Medicine | Admitting: Emergency Medicine

## 2020-05-22 ENCOUNTER — Emergency Department (HOSPITAL_COMMUNITY): Payer: Medicare Other

## 2020-05-22 DIAGNOSIS — Z992 Dependence on renal dialysis: Secondary | ICD-10-CM | POA: Diagnosis not present

## 2020-05-22 DIAGNOSIS — Z20822 Contact with and (suspected) exposure to covid-19: Secondary | ICD-10-CM | POA: Insufficient documentation

## 2020-05-22 DIAGNOSIS — E875 Hyperkalemia: Secondary | ICD-10-CM | POA: Diagnosis not present

## 2020-05-22 DIAGNOSIS — I132 Hypertensive heart and chronic kidney disease with heart failure and with stage 5 chronic kidney disease, or end stage renal disease: Secondary | ICD-10-CM | POA: Diagnosis not present

## 2020-05-22 DIAGNOSIS — Z87891 Personal history of nicotine dependence: Secondary | ICD-10-CM | POA: Insufficient documentation

## 2020-05-22 DIAGNOSIS — I509 Heart failure, unspecified: Secondary | ICD-10-CM | POA: Insufficient documentation

## 2020-05-22 DIAGNOSIS — Z79899 Other long term (current) drug therapy: Secondary | ICD-10-CM | POA: Insufficient documentation

## 2020-05-22 DIAGNOSIS — N186 End stage renal disease: Secondary | ICD-10-CM | POA: Diagnosis not present

## 2020-05-22 DIAGNOSIS — R112 Nausea with vomiting, unspecified: Secondary | ICD-10-CM | POA: Diagnosis present

## 2020-05-22 DIAGNOSIS — I10 Essential (primary) hypertension: Secondary | ICD-10-CM

## 2020-05-22 LAB — COMPREHENSIVE METABOLIC PANEL
ALT: 14 U/L (ref 0–44)
AST: 29 U/L (ref 15–41)
Albumin: 3.2 g/dL — ABNORMAL LOW (ref 3.5–5.0)
Alkaline Phosphatase: 212 U/L — ABNORMAL HIGH (ref 38–126)
Anion gap: 12 (ref 5–15)
BUN: 85 mg/dL — ABNORMAL HIGH (ref 6–20)
CO2: 23 mmol/L (ref 22–32)
Calcium: 8.4 mg/dL — ABNORMAL LOW (ref 8.9–10.3)
Chloride: 100 mmol/L (ref 98–111)
Creatinine, Ser: 13.86 mg/dL — ABNORMAL HIGH (ref 0.61–1.24)
GFR, Estimated: 4 mL/min — ABNORMAL LOW (ref >60)
Glucose, Bld: 84 mg/dL (ref 70–99)
Potassium: 6.8 mmol/L (ref 3.5–5.1)
Sodium: 135 mmol/L (ref 135–145)
Total Bilirubin: 0.8 mg/dL (ref 0.3–1.2)
Total Protein: 7.7 g/dL (ref 6.5–8.1)

## 2020-05-22 LAB — CBC WITH DIFFERENTIAL/PLATELET
Abs Immature Granulocytes: 0.01 10*3/uL (ref 0.00–0.07)
Basophils Absolute: 0 10*3/uL (ref 0.0–0.1)
Basophils Relative: 0 %
Eosinophils Absolute: 0.3 10*3/uL (ref 0.0–0.5)
Eosinophils Relative: 9 %
HCT: 29.9 % — ABNORMAL LOW (ref 39.0–52.0)
Hemoglobin: 9.4 g/dL — ABNORMAL LOW (ref 13.0–17.0)
Immature Granulocytes: 0 %
Lymphocytes Relative: 15 %
Lymphs Abs: 0.5 10*3/uL — ABNORMAL LOW (ref 0.7–4.0)
MCH: 26.3 pg (ref 26.0–34.0)
MCHC: 31.4 g/dL (ref 30.0–36.0)
MCV: 83.5 fL (ref 80.0–100.0)
Monocytes Absolute: 0.6 10*3/uL (ref 0.1–1.0)
Monocytes Relative: 18 %
Neutro Abs: 1.9 10*3/uL (ref 1.7–7.7)
Neutrophils Relative %: 58 %
Platelets: 124 10*3/uL — ABNORMAL LOW (ref 150–400)
RBC: 3.58 MIL/uL — ABNORMAL LOW (ref 4.22–5.81)
RDW: 19.7 % — ABNORMAL HIGH (ref 11.5–15.5)
WBC: 3.4 10*3/uL — ABNORMAL LOW (ref 4.0–10.5)
nRBC: 0 % (ref 0.0–0.2)

## 2020-05-22 LAB — RESPIRATORY PANEL BY RT PCR (FLU A&B, COVID)
Influenza A by PCR: NEGATIVE
Influenza B by PCR: NEGATIVE
SARS Coronavirus 2 by RT PCR: NEGATIVE

## 2020-05-22 LAB — TROPONIN I (HIGH SENSITIVITY): Troponin I (High Sensitivity): 22 ng/L — ABNORMAL HIGH (ref <18)

## 2020-05-22 LAB — CBG MONITORING, ED: Glucose-Capillary: 73 mg/dL (ref 70–99)

## 2020-05-22 MED ORDER — PENTAFLUOROPROP-TETRAFLUOROETH EX AERO
1.0000 "application " | INHALATION_SPRAY | CUTANEOUS | Status: DC | PRN
  Filled 2020-05-22: qty 116

## 2020-05-22 MED ORDER — ONDANSETRON HCL 4 MG/2ML IJ SOLN
4.0000 mg | Freq: Once | INTRAMUSCULAR | Status: AC
  Administered 2020-05-22: 4 mg via INTRAVENOUS
  Filled 2020-05-22: qty 2

## 2020-05-22 MED ORDER — HEPARIN SODIUM (PORCINE) 1000 UNIT/ML IJ SOLN
INTRAMUSCULAR | Status: AC
  Filled 2020-05-22: qty 4

## 2020-05-22 MED ORDER — MORPHINE SULFATE (PF) 2 MG/ML IV SOLN
2.0000 mg | Freq: Once | INTRAVENOUS | Status: AC
  Administered 2020-05-22: 2 mg via INTRAVENOUS
  Filled 2020-05-22: qty 1

## 2020-05-22 MED ORDER — HYDROMORPHONE HCL 1 MG/ML IJ SOLN
1.0000 mg | Freq: Once | INTRAMUSCULAR | Status: AC
  Administered 2020-05-22: 1 mg via INTRAVENOUS
  Filled 2020-05-22: qty 1

## 2020-05-22 MED ORDER — CALCIUM GLUCONATE-NACL 1-0.675 GM/50ML-% IV SOLN
1.0000 g | Freq: Once | INTRAVENOUS | Status: AC
  Administered 2020-05-22: 1000 mg via INTRAVENOUS
  Filled 2020-05-22: qty 50

## 2020-05-22 MED ORDER — SODIUM CHLORIDE 0.9 % IV SOLN: 100.0000 mL | INTRAVENOUS | Status: DC | PRN

## 2020-05-22 MED ORDER — LIDOCAINE-PRILOCAINE 2.5-2.5 % EX CREA
1.0000 "application " | TOPICAL_CREAM | CUTANEOUS | Status: DC | PRN
  Filled 2020-05-22: qty 5

## 2020-05-22 MED ORDER — SODIUM ZIRCONIUM CYCLOSILICATE 10 G PO PACK: 10.0000 g | PACK | Freq: Once | ORAL | Status: DC

## 2020-05-22 MED ORDER — ALTEPLASE 2 MG IJ SOLR: 2.0000 mg | Freq: Once | INTRAMUSCULAR | Status: DC | PRN

## 2020-05-22 MED ORDER — HYDRALAZINE HCL 20 MG/ML IJ SOLN
10.0000 mg | Freq: Once | INTRAMUSCULAR | Status: AC
  Administered 2020-05-22: 10 mg via INTRAVENOUS
  Filled 2020-05-22: qty 1

## 2020-05-22 MED ORDER — CALCIUM GLUCONATE 10 % IV SOLN: 1.0000 g | Freq: Once | INTRAVENOUS | Status: DC

## 2020-05-22 MED ORDER — LIDOCAINE HCL (PF) 1 % IJ SOLN: 5.0000 mL | INTRAMUSCULAR | Status: DC | PRN

## 2020-05-22 MED ORDER — CHLORHEXIDINE GLUCONATE CLOTH 2 % EX PADS: 6.0000 | MEDICATED_PAD | Freq: Every day | CUTANEOUS | Status: DC

## 2020-05-22 MED ORDER — SODIUM BICARBONATE 8.4 % IV SOLN
Freq: Once | INTRAVENOUS | Status: DC
  Filled 2020-05-22: qty 100

## 2020-05-22 MED ORDER — SODIUM BICARBONATE 8.4 % IV SOLN
50.0000 meq | Freq: Once | INTRAVENOUS | Status: AC
  Administered 2020-05-22: 50 meq via INTRAVENOUS
  Filled 2020-05-22: qty 50

## 2020-05-22 NOTE — ED Notes (Signed)
Patient refused repeat labs (for trop, lipase and repeat CMP due to hemolysis)  after multiple attempts by different staff.

## 2020-05-22 NOTE — ED Notes (Signed)
Per lab, initial sample collected was hemolyzed. New green top collected with no issue from established access. Medications administered as ordered via IV. Patient commented "Is that the morphine? You're diluting it too much" Informed patient standard IV flush is used, and continued to administer medications as IV push meds.
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
## 2020-05-22 NOTE — ED Notes (Signed)
Dinner Tray Ordered @ 1704. 

## 2020-05-22 NOTE — ED Provider Notes (Signed)
Care assumed from [facility] at shift change.   See her note for full H&P.   Per her note, "54 year old male with extensive medical history including CHF with an EF of 45%, pancreatitis, chronic abdominal pain, ESRD on dialysis Tuesday Thursday Saturday, came to the ER with need for dialysis.  He complains of nausea, vomiting, generalized abdominal pain which appears similar to his prior presentations.  He is aware of the left renal mass and has a pending appointment with urology on the third of this month.  He states the last time he was dialyzed was this past Thursday.  He states he is in between dialysis centers as he "needed to get away from the last one".  He reports that he is currently in the process of finding another dialysis center.  He also complains of back pain which again is unchanged from prior presentation."    At shift change pt is in dialysis. Plan is to reassess him after dialysis and make disposition decision. At the next shift change, pt is still in dialysis. Care transitioned to Dr. Zenia Resides with above plan.   Bishop Dublin 05/22/20 2209    Lacretia Leigh, MD 05/22/20 2311

## 2020-05-22 NOTE — ED Notes (Signed)
Called lab to add troponin to recently sent green top.

## 2020-05-22 NOTE — ED Triage Notes (Signed)
Pt requesting dialysis, last treatment Wednesday when he was here. NAD noted at this time, resp e.u

## 2020-05-22 NOTE — ED Notes (Signed)
Lunch Tray Ordered @ 1454-per William Hamburger, RN called by Levada Dy

## 2020-05-22 NOTE — Progress Notes (Signed)
[facility] Progress Note   Subjective:    Mr. Buchmann is a 54 year old male with PMH including ESRD on dialysis, HTN, CHF, chronic pancreatitis, and chronic pain who presented to the ED today in need of dialysis. Last dialysis was here on 05/19/20. He has been involuntarily discharged from his dialysis center on 04/23/20 for breach of behavior contract. Patient reports weakness, shortness of breath, nausea, vomiting, chest pain, and peripheral edema. However his main complaint today is acute on chronic back pain. BP is significantly elevated. Labs reveal K+ 6.8, BUN 85, Scr 13.86, Ca 8.4, Alb 3.2, Hgb 9.4, WBC 3.4, Plt 124. Calcium gluconate and sodium bicarbonate already ordered and he has received morphine. Patient feels he needs serial dialysis after missing his last treatment.   Objective Vitals:   05/22/20 1215 05/22/20 1230 05/22/20 1353 05/22/20 1355  BP: (!) 210/107 (!) 216/118  (!) 217/130  Pulse:    69  Resp:  14  17  Temp:      TempSrc:      SpO2:   96% 100%  Weight:    81.6 kg  Height:    _0  (1.88 m)   Physical Exam General: Chronically ill appearing male seated on bedside, alert and in NAD Heart: RRR, no murmurs, rubs or gallops Lungs: + rales right lower lobe. Symmetrical chest rise.  Respirations unlabored on O2 via [place] Abdomen:  Soft, non-tender, slightly distended. +BS Extremities: 2+ edema bilateral lower extremities Dialysis Access: LUE AVF + thrill/bruit Skin: Skin b/l legs dry but no visible rashes/lesions Neuro: alert and oriented x3, no tremor or asterixis Psych: Calm, appropriate mood and affect  Additional Objective Labs: Basic Metabolic Panel: Recent Labs  Lab 05/17/20 1504 05/22/20 1231  NA 137 135  K 4.9 6.8*  CL 101 100  CO2 23 23  GLUCOSE 102* 84  BUN 61* 85*  CREATININE 10.44* 13.86*  CALCIUM 8.3* 8.4*  PHOS 5.5*  --    Liver Function Tests: Recent Labs  Lab 05/17/20 1504 05/22/20 1231  AST  --  29  ALT  --  14   ALKPHOS  --  212*  BILITOT  --  0.8  PROT  --  7.7  ALBUMIN 3.0* 3.2*   CBC: Recent Labs  Lab 05/17/20 1504 05/22/20 1147  WBC 2.5* 3.4*  NEUTROABS  --  1.9  HGB 8.6* 9.4*  HCT 28.6* 29.9*  MCV 85.1 83.5  PLT 118* 124*   Blood Culture    Component Value Date/Time   SDES PLEURAL 07/29/2018 1300   SDES PLEURAL 07/29/2018 1300   SPECREQUEST NONE 07/29/2018 1300   SPECREQUEST NONE 07/29/2018 1300   CULT  07/29/2018 1300    NO GROWTH 5 DAYS Performed at [facility] [address] [place], [place] [address]    REPTSTATUS 08/03/2018 FINAL 07/29/2018 1300   REPTSTATUS 07/29/2018 FINAL 07/29/2018 1300    CBG: Recent Labs  Lab 05/17/20 1044 05/22/20 0827  GLUCAP 73 73    Studies/Results: DG Chest Portable 1 View  Result Date: 05/22/2020 CLINICAL DATA:  Shortness of breath and back pain EXAM: PORTABLE CHEST 1 VIEW COMPARISON:  05/07/2020 FINDINGS: Central line unchanged with tip in the right atrium. Marked enlargement of the cardiac silhouette as seen previously. Persistent low level edema. Right effusion with volume loss at the right base has worsened slightly. IMPRESSION: 1. Slight worsening of right effusion and volume loss at the right base. 2. Persistent low level edema and marked enlargement  of the cardiac silhouette. Electronically Signed   By: [name] M.D.   On: 05/22/2020 11:34   Medications:  .  morphine injection  2 mg Intravenous Once    Outpatient Dialysis Orders: No current center. Most recent orders: [facility]   4h  400/800   79kg  2/2 bath TDC  Hep none - Parsabiv 7.51m IV q HD - Mircera 2074m IV q 2 weeks - Venofer 5068mV q week  Assessment/Plan: 1. ESRD: Last HD 05/19/20 due to discharge from outpatient dialysis for behavorial issues. He is significantly volume overloaded with hyperkalemia. We will perform urgent HD today. ED RN asked to perform COVID swab. Returning to ED for reevaluation after treatment.  2. HTN/volume:  BP  significantly elevated, CXR with edema and R effusion. UF goal 4L with HD today. Resume home BP meds.  3. Anemia: Hgb 9.4. Does not appear he has received an ESA lately. Will order aranesp today.  4. Secondary hyperparathyroidism:  Previously received parsabiv with HD, not on formulary here. Not taking binders. I will check a phos at start of HD since he does not currently have an outpatient dialysis center. 5. Nutrition:  Alb 3.2. Increase protein intake. Renal diet/fluid restrictions recommended.   SamAnice PaganiniA-C 05/22/2020, 2:21 PM  [facility] Pager: [phone]

## 2020-05-23 MED ORDER — ACETAMINOPHEN 325 MG PO TABS: 650.0000 mg | ORAL_TABLET | Freq: Once | ORAL | Status: DC | PRN

## 2020-05-23 MED ORDER — LIDOCAINE 5 % EX PTCH: 1.0000 | MEDICATED_PATCH | Freq: Once | CUTANEOUS | Status: DC | PRN

## 2020-05-23 MED ORDER — OXYCODONE HCL 5 MG PO TABS: 15.0000 mg | ORAL_TABLET | Freq: Once | ORAL | Status: DC

## 2020-05-23 NOTE — ED Provider Notes (Signed)
I was asked to re-evaluate the patient when he returned from hemodialysis.  He is in no acute distress.  His blood pressure is markedly improved, 140s over 70s.  He is requesting a dose of pain medication for his back pain.  Patient's chart has been reviewed.  He was given IV Dilaudid and 2 doses of IV morphine many hours ago that have now been metabolized.  He is on a 15 mg of oxycodone 6 times daily as needed for pain per his medication list.   I plan to offer the patient a dose of his home oxycodone since he has been in the ER for 16 hours.  However, the patient then disclosed that he was driving himself home from the ER.  I discussed that I cannot give him narcotic pain medication if he is going to operate a motor vehicle.  I then asked the patient if he was able to get a ride home, to which he replied "maybe, but I cannot confirm that."  I discussed at length with the patient that if he is going to operate a motor vehicle that for the safety of himself and other motorist that he cannot be administered narcotic medications in the ER.  The patient then began to demand IV pain medication and said " that is fast acting.  I can to stay in sleep but often then go home after that."  I discussed that since he was in no acute distress and it since his vital signs were markedly improved that he was going to be discharged home.  I placed an order for Tylenol and a lidocaine patch.  He had his home oxycodone refilled on October 5 for a 30-day supply and was advised to take his home pain medication after discharge.  He is hemodynamically stable and in no acute distress.  Safe for discharge to home with outpatient follow-up as needed and to return to the emergency department when he is due for hemodialysis as he does not currently have a hemodialysis center.   Joanne Gavel, PA-C 05/23/20 0019    Deno Etienne, DO 05/23/20 [phone]

## 2020-05-23 NOTE — Discharge Instructions (Signed)
Thank you for allowing me to care for you today in the Emergency Department.   After driving home, you can take your home medication for pain.  Return to the emergency department if you develop chest pain, worsening shortness of breath, or when you need dialysis again.

## 2020-05-23 NOTE — Progress Notes (Signed)
   05/22/20 2323  Vitals  Temp 98 F (36.7 C)  Temp Source Oral  BP (!) 150/55  BP Location Left Leg  BP Method Automatic  Patient Position (if appropriate) Lying  Pulse Rate 77  Pulse Rate Source Monitor  Resp 14  Oxygen Therapy  SpO2 100 %  O2 Device Room Air  O2 Flow Rate (L/min) 2 L/min  Pain Assessment  Pain Scale 0-10  Pain Score 7  Faces Pain Scale 4  Pain Type Chronic pain  Pain Location Back  Pain Orientation Lower  Pain Descriptors / Indicators Aching  Pain Frequency Constant  Pain Onset On-going  Patients Stated Pain Goal 0  Pain Intervention(s) Emotional support  Post-Hemodialysis Assessment  Rinseback Volume (mL) 250 mL  KECN 275 V  Dialyzer Clearance Lightly streaked  Duration of HD Treatment -hour(s) 3.75 hour(s)  Hemodialysis Intake (mL) 500 mL  UF Total -Machine (mL) 2839 mL  Net UF (mL) 2339 mL  Tolerated HD Treatment No (Comment) (Cramping)  Post-Hemodialysis Comments requestedto be off prior to full time prescribed due to cramps and lower back pain.  AVG/AVF Arterial Site Held (minutes) 0 minutes  AVG/AVF Venous Site Held (minutes) 0 minutes  Education / Care Plan  Dialysis Education Provided Yes  Hemodialysis Catheter Left  No placement date or time found.   Placed prior to admission: Yes  Orientation: Left  Site Condition No complications  Blue Lumen Status Heparin locked;Capped (Central line)  Red Lumen Status Heparin locked;Capped (Central line)  Catheter fill solution Heparin 1000 units/ml  Catheter fill volume (Arterial) 1.9 cc  Catheter fill volume (Venous) 1.9  Dressing Type Occlusive  Dressing Status Dry;Intact  Drainage Description None  Post treatment catheter status Capped and Clamped

## 2020-05-23 NOTE — ED Notes (Signed)
Pt has made RN and PA aware he will be driving home , pt refusing current Tylenol and lidocaine patch for pain. Pt has been read DC instruction, and appears to be in NAD at this time , IV removed , VS taken , after pt is dressed he will be wheeled to lobby

## 2020-05-25 ENCOUNTER — Other Ambulatory Visit: Payer: Self-pay

## 2020-05-25 ENCOUNTER — Emergency Department (HOSPITAL_COMMUNITY)
Admission: EM | Admit: 2020-05-25 | Discharge: 2020-05-26 | Disposition: A | Discharge status: Home / Self Care | Payer: Medicare Other | Attending: Emergency Medicine | Admitting: Emergency Medicine

## 2020-05-25 DIAGNOSIS — Z87891 Personal history of nicotine dependence: Secondary | ICD-10-CM | POA: Diagnosis not present

## 2020-05-25 DIAGNOSIS — E1122 Type 2 diabetes mellitus with diabetic chronic kidney disease: Secondary | ICD-10-CM | POA: Diagnosis not present

## 2020-05-25 DIAGNOSIS — Z66 Do not resuscitate: Secondary | ICD-10-CM | POA: Diagnosis not present

## 2020-05-25 DIAGNOSIS — Z515 Encounter for palliative care: Secondary | ICD-10-CM | POA: Diagnosis not present

## 2020-05-25 DIAGNOSIS — E1159 Type 2 diabetes mellitus with other circulatory complications: Secondary | ICD-10-CM | POA: Insufficient documentation

## 2020-05-25 DIAGNOSIS — I5042 Chronic combined systolic (congestive) and diastolic (congestive) heart failure: Secondary | ICD-10-CM | POA: Insufficient documentation

## 2020-05-25 DIAGNOSIS — Z86711 Personal history of pulmonary embolism: Secondary | ICD-10-CM | POA: Insufficient documentation

## 2020-05-25 DIAGNOSIS — Z992 Dependence on renal dialysis: Secondary | ICD-10-CM | POA: Insufficient documentation

## 2020-05-25 DIAGNOSIS — N186 End stage renal disease: Secondary | ICD-10-CM | POA: Insufficient documentation

## 2020-05-25 DIAGNOSIS — I132 Hypertensive heart and chronic kidney disease with heart failure and with stage 5 chronic kidney disease, or end stage renal disease: Secondary | ICD-10-CM | POA: Diagnosis not present

## 2020-05-25 DIAGNOSIS — Z7901 Long term (current) use of anticoagulants: Secondary | ICD-10-CM | POA: Diagnosis not present

## 2020-05-25 DIAGNOSIS — Z79899 Other long term (current) drug therapy: Secondary | ICD-10-CM | POA: Insufficient documentation

## 2020-05-25 DIAGNOSIS — D631 Anemia in chronic kidney disease: Secondary | ICD-10-CM | POA: Insufficient documentation

## 2020-05-25 DIAGNOSIS — Z20822 Contact with and (suspected) exposure to covid-19: Secondary | ICD-10-CM | POA: Insufficient documentation

## 2020-05-25 DIAGNOSIS — M545 Low back pain, unspecified: Secondary | ICD-10-CM | POA: Diagnosis present

## 2020-05-25 LAB — RESPIRATORY PANEL BY RT PCR (FLU A&B, COVID)
Influenza A by PCR: NEGATIVE
Influenza B by PCR: NEGATIVE
SARS Coronavirus 2 by RT PCR: NEGATIVE

## 2020-05-25 MED ORDER — OXYCODONE HCL 5 MG PO TABS
15.0000 mg | ORAL_TABLET | Freq: Four times a day (QID) | ORAL | Status: DC | PRN
  Administered 2020-05-25: 15 mg via ORAL
  Filled 2020-05-25: qty 3

## 2020-05-25 MED ORDER — CARVEDILOL 3.125 MG PO TABS
3.1250 mg | ORAL_TABLET | Freq: Two times a day (BID) | ORAL | Status: DC
  Administered 2020-05-25: 3.125 mg via ORAL
  Filled 2020-05-25 (×2): qty 1

## 2020-05-25 MED ORDER — ONDANSETRON 4 MG PO TBDP
4.0000 mg | ORAL_TABLET | Freq: Once | ORAL | Status: AC
  Administered 2020-05-25: 4 mg via ORAL
  Filled 2020-05-25: qty 1

## 2020-05-25 MED ORDER — HYDROMORPHONE HCL 1 MG/ML IJ SOLN
1.0000 mg | Freq: Once | INTRAMUSCULAR | Status: AC
  Administered 2020-05-25: 1 mg via INTRAMUSCULAR
  Filled 2020-05-25: qty 1

## 2020-05-25 MED ORDER — SODIUM CHLORIDE 0.9 % IV SOLN: 100.0000 mL | INTRAVENOUS | Status: DC | PRN

## 2020-05-25 MED ORDER — AMLODIPINE BESYLATE 5 MG PO TABS
5.0000 mg | ORAL_TABLET | Freq: Every day | ORAL | Status: DC
  Administered 2020-05-25: 5 mg via ORAL
  Filled 2020-05-25: qty 1

## 2020-05-25 MED ORDER — PENTAFLUOROPROP-TETRAFLUOROETH EX AERO
1.0000 "application " | INHALATION_SPRAY | CUTANEOUS | Status: DC | PRN
  Filled 2020-05-25: qty 116

## 2020-05-25 MED ORDER — HYDRALAZINE HCL 25 MG PO TABS
100.0000 mg | ORAL_TABLET | Freq: Once | ORAL | Status: AC
  Administered 2020-05-25: 100 mg via ORAL
  Filled 2020-05-25: qty 4

## 2020-05-25 MED ORDER — AMLODIPINE BESYLATE 5 MG PO TABS
10.0000 mg | ORAL_TABLET | Freq: Once | ORAL | Status: AC
  Administered 2020-05-25: 10 mg via ORAL
  Filled 2020-05-25: qty 2

## 2020-05-25 MED ORDER — LIDOCAINE HCL (PF) 1 % IJ SOLN: 5.0000 mL | INTRAMUSCULAR | Status: DC | PRN

## 2020-05-25 MED ORDER — LIDOCAINE-PRILOCAINE 2.5-2.5 % EX CREA
1.0000 "application " | TOPICAL_CREAM | CUTANEOUS | Status: DC | PRN
  Filled 2020-05-25: qty 5

## 2020-05-25 MED ORDER — UMECLIDINIUM BROMIDE 62.5 MCG/INH IN AEPB
1.0000 | INHALATION_SPRAY | Freq: Every day | RESPIRATORY_TRACT | Status: DC
  Filled 2020-05-25: qty 7

## 2020-05-25 MED ORDER — HEPARIN SODIUM (PORCINE) 1000 UNIT/ML DIALYSIS
1000.0000 [IU] | INTRAMUSCULAR | Status: DC | PRN
  Filled 2020-05-25: qty 1

## 2020-05-25 MED ORDER — ALTEPLASE 2 MG IJ SOLR: 2.0000 mg | Freq: Once | INTRAMUSCULAR | Status: DC | PRN

## 2020-05-25 MED ORDER — CHLORHEXIDINE GLUCONATE CLOTH 2 % EX PADS: 6.0000 | MEDICATED_PAD | Freq: Every day | CUTANEOUS | Status: DC

## 2020-05-25 NOTE — ED Notes (Signed)
Pt refused EKG.

## 2020-05-25 NOTE — ED Triage Notes (Signed)
Pt here for eval of back pain, nausea and abdominal pain x 2 days. Also requesting dialysis as he missed his tx yesterday d/t being out of town.

## 2020-05-25 NOTE — ED Notes (Signed)
Called HD to follow up time for pt to go to HD and HD staff states that it will no be until tomorrow morning.

## 2020-05-25 NOTE — ED Notes (Signed)
Told pt he was being transferred to room 41 in order to receive dialysis and pt refused to move rooms and stated he has waited too long and needs to go home to pay his light bill. I let the charge and room nurse know.
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
## 2020-05-25 NOTE — Progress Notes (Signed)
   05/25/20 1840  TOC ED Mini Assessment  TOC Time spent with patient (minutes): 15  PING Used in TOC Assessment Yes  Admission or Readmission Diverted No  What brought you to the Emergency Department?  patient is here for Hemodialysis  Patient has had 40 ED visits in the 6 months patient is a HD patient.  He has not had HD in 3 days,patient appears to have been discharged from his OP HD center.  Patient is here  awaiting HD on tonight.

## 2020-05-26 IMAGING — DX DG CHEST 1V PORT
1 series · 1 of 1 positions shown · non-contrast
Comparison: 12/10/2017

CLINICAL DATA: Weakness

EXAM:
PORTABLE CHEST 1 VIEW

[chest ap]
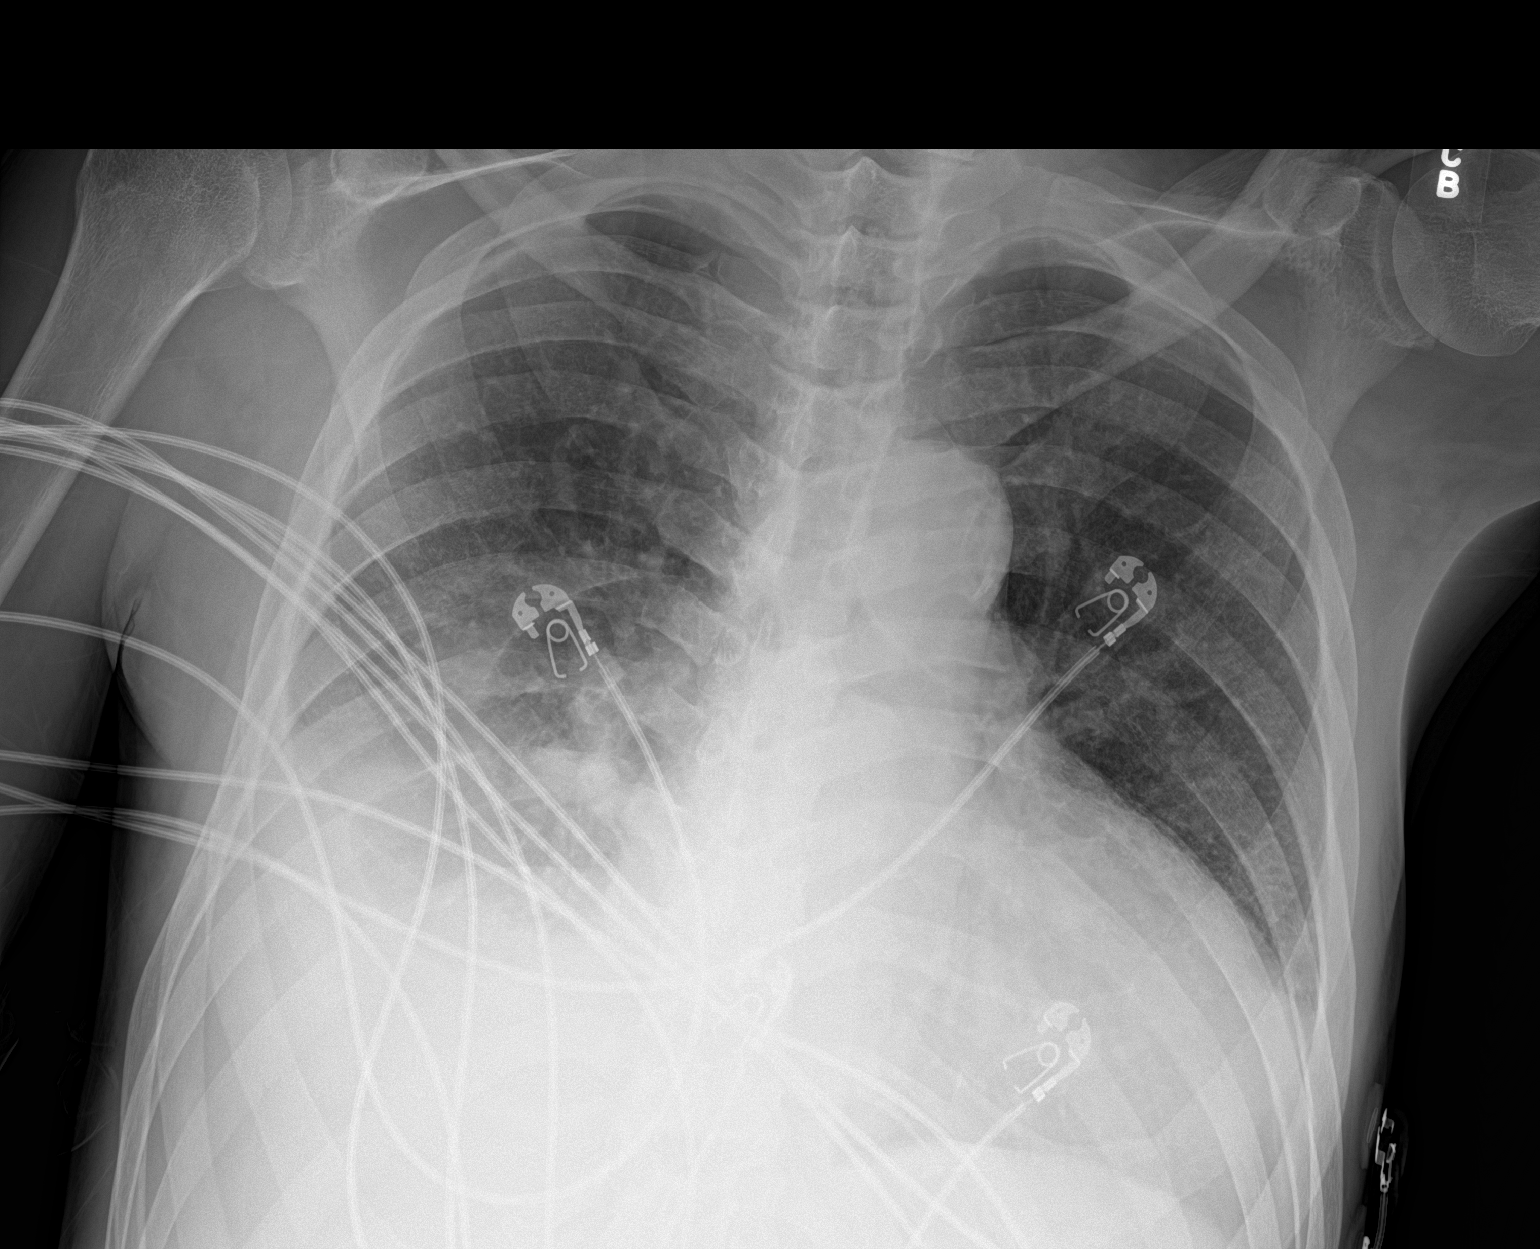

[1 of 1 positions shown; findings below may reference images not displayed]

FINDINGS: Stable cardiomegaly with aortic atherosclerosis. Mild interstitial
edema with chronic moderate right effusion. Left costophrenic angle
is excluded and therefore assessment for a small left effusion is
limited. No acute osseous abnormality.
IMPRESSION: Stable cardiomegaly with mild aortic atherosclerosis. Mild
interstitial pulmonary edema with chronic moderate right effusion.

## 2020-05-26 MED ORDER — HEPARIN SODIUM (PORCINE) 1000 UNIT/ML IJ SOLN
INTRAMUSCULAR | Status: AC
  Filled 2020-05-26: qty 4

## 2020-05-26 NOTE — Discharge Instructions (Addendum)
Please return for any problem.  °

## 2020-05-26 NOTE — ED Notes (Signed)
  Attempted to draw blood work from patient but he refused.  Stated "they can do it when I get dialysis".  MD notified.

## 2020-05-26 NOTE — ED Provider Notes (Signed)
Patient has now completed dialysis.  Patient reports that he is now ready for discharge.  He declines further observation or intervention in the ED.  Patient reports that social work is attempting to find outpatient solutions for his dialysis needs.      Valarie Merino, MD 05/26/20 [phone]

## 2020-05-30 ENCOUNTER — Other Ambulatory Visit: Payer: Self-pay

## 2020-05-30 ENCOUNTER — Emergency Department (HOSPITAL_COMMUNITY)
Admission: EM | Admit: 2020-05-30 | Discharge: 2020-05-31 | Disposition: A | Discharge status: Home / Self Care | Payer: Medicare Other | Attending: Emergency Medicine | Admitting: Emergency Medicine

## 2020-05-30 ENCOUNTER — Encounter (HOSPITAL_COMMUNITY): Payer: Self-pay | Admitting: Emergency Medicine

## 2020-05-30 DIAGNOSIS — I132 Hypertensive heart and chronic kidney disease with heart failure and with stage 5 chronic kidney disease, or end stage renal disease: Secondary | ICD-10-CM | POA: Insufficient documentation

## 2020-05-30 DIAGNOSIS — M545 Low back pain, unspecified: Secondary | ICD-10-CM | POA: Insufficient documentation

## 2020-05-30 DIAGNOSIS — Z20822 Contact with and (suspected) exposure to covid-19: Secondary | ICD-10-CM | POA: Insufficient documentation

## 2020-05-30 DIAGNOSIS — Z992 Dependence on renal dialysis: Secondary | ICD-10-CM | POA: Diagnosis not present

## 2020-05-30 DIAGNOSIS — R112 Nausea with vomiting, unspecified: Secondary | ICD-10-CM | POA: Insufficient documentation

## 2020-05-30 DIAGNOSIS — Z79899 Other long term (current) drug therapy: Secondary | ICD-10-CM | POA: Diagnosis not present

## 2020-05-30 DIAGNOSIS — N186 End stage renal disease: Secondary | ICD-10-CM | POA: Diagnosis not present

## 2020-05-30 DIAGNOSIS — J441 Chronic obstructive pulmonary disease with (acute) exacerbation: Secondary | ICD-10-CM | POA: Insufficient documentation

## 2020-05-30 DIAGNOSIS — Z87891 Personal history of nicotine dependence: Secondary | ICD-10-CM | POA: Diagnosis not present

## 2020-05-30 DIAGNOSIS — R1084 Generalized abdominal pain: Secondary | ICD-10-CM | POA: Diagnosis not present

## 2020-05-30 DIAGNOSIS — I5042 Chronic combined systolic (congestive) and diastolic (congestive) heart failure: Secondary | ICD-10-CM | POA: Diagnosis not present

## 2020-05-30 DIAGNOSIS — Z7951 Long term (current) use of inhaled steroids: Secondary | ICD-10-CM | POA: Diagnosis not present

## 2020-05-30 DIAGNOSIS — Z7901 Long term (current) use of anticoagulants: Secondary | ICD-10-CM | POA: Diagnosis not present

## 2020-05-30 DIAGNOSIS — E785 Hyperlipidemia, unspecified: Secondary | ICD-10-CM | POA: Insufficient documentation

## 2020-05-30 DIAGNOSIS — E1169 Type 2 diabetes mellitus with other specified complication: Secondary | ICD-10-CM | POA: Diagnosis not present

## 2020-05-30 DIAGNOSIS — E1122 Type 2 diabetes mellitus with diabetic chronic kidney disease: Secondary | ICD-10-CM | POA: Diagnosis not present

## 2020-05-30 NOTE — ED Notes (Signed)
Pt refused blood drawn in triage

## 2020-05-30 NOTE — ED Triage Notes (Addendum)
Pt to ED for dialysis.  Last dialyzed on Thur. Pt c/o shortness of breath and back pain

## 2020-05-31 ENCOUNTER — Encounter (HOSPITAL_COMMUNITY): Payer: Self-pay | Admitting: Student

## 2020-05-31 LAB — RENAL FUNCTION PANEL
Albumin: 3.2 g/dL — ABNORMAL LOW (ref 3.5–5.0)
Anion gap: 16 — ABNORMAL HIGH (ref 5–15)
BUN: 82 mg/dL — ABNORMAL HIGH (ref 6–20)
CO2: 21 mmol/L — ABNORMAL LOW (ref 22–32)
Calcium: 7.8 mg/dL — ABNORMAL LOW (ref 8.9–10.3)
Chloride: 100 mmol/L (ref 98–111)
Creatinine, Ser: 14.57 mg/dL — ABNORMAL HIGH (ref 0.61–1.24)
GFR, Estimated: 4 mL/min — ABNORMAL LOW (ref >60)
Glucose, Bld: 132 mg/dL — ABNORMAL HIGH (ref 70–99)
Phosphorus: 7.1 mg/dL — ABNORMAL HIGH (ref 2.5–4.6)
Potassium: 5.7 mmol/L — ABNORMAL HIGH (ref 3.5–5.1)
Sodium: 137 mmol/L (ref 135–145)

## 2020-05-31 LAB — CBC
HCT: 28.4 % — ABNORMAL LOW (ref 39.0–52.0)
Hemoglobin: 8.7 g/dL — ABNORMAL LOW (ref 13.0–17.0)
MCH: 26.2 pg (ref 26.0–34.0)
MCHC: 30.6 g/dL (ref 30.0–36.0)
MCV: 85.5 fL (ref 80.0–100.0)
Platelets: 110 10*3/uL — ABNORMAL LOW (ref 150–400)
RBC: 3.32 MIL/uL — ABNORMAL LOW (ref 4.22–5.81)
RDW: 19.1 % — ABNORMAL HIGH (ref 11.5–15.5)
WBC: 3.6 10*3/uL — ABNORMAL LOW (ref 4.0–10.5)
nRBC: 0 % (ref 0.0–0.2)

## 2020-05-31 LAB — CBG MONITORING, ED
Glucose-Capillary: 80 mg/dL (ref 70–99)
Glucose-Capillary: 70 mg/dL (ref 70–99)

## 2020-05-31 LAB — RESPIRATORY PANEL BY RT PCR (FLU A&B, COVID)
Influenza A by PCR: NEGATIVE
Influenza B by PCR: NEGATIVE
SARS Coronavirus 2 by RT PCR: NEGATIVE

## 2020-05-31 MED ORDER — OXYCODONE HCL 5 MG PO TABS: 15.0000 mg | ORAL_TABLET | Freq: Once | ORAL | Status: DC

## 2020-05-31 MED ORDER — LIDOCAINE HCL (PF) 1 % IJ SOLN: 5.0000 mL | INTRAMUSCULAR | Status: DC | PRN

## 2020-05-31 MED ORDER — ONDANSETRON 4 MG PO TBDP: 8.0000 mg | ORAL_TABLET | Freq: Once | ORAL | Status: DC

## 2020-05-31 MED ORDER — HEPARIN SODIUM (PORCINE) 1000 UNIT/ML DIALYSIS
1000.0000 [IU] | INTRAMUSCULAR | Status: DC | PRN
  Filled 2020-05-31: qty 1

## 2020-05-31 MED ORDER — PENTAFLUOROPROP-TETRAFLUOROETH EX AERO
1.0000 "application " | INHALATION_SPRAY | CUTANEOUS | Status: DC | PRN
  Filled 2020-05-31: qty 116

## 2020-05-31 MED ORDER — HEPARIN SODIUM (PORCINE) 1000 UNIT/ML DIALYSIS
40.0000 [IU]/kg | INTRAMUSCULAR | Status: DC | PRN
  Filled 2020-05-31: qty 4

## 2020-05-31 MED ORDER — ONDANSETRON 4 MG PO TBDP: 4.0000 mg | ORAL_TABLET | Freq: Once | ORAL | Status: DC

## 2020-05-31 MED ORDER — ONDANSETRON 4 MG PO TBDP
8.0000 mg | ORAL_TABLET | Freq: Once | ORAL | Status: DC
  Filled 2020-05-31: qty 2

## 2020-05-31 MED ORDER — CHLORHEXIDINE GLUCONATE CLOTH 2 % EX PADS: 6.0000 | MEDICATED_PAD | Freq: Every day | CUTANEOUS | Status: DC

## 2020-05-31 MED ORDER — OXYCODONE HCL 5 MG PO TABS
15.0000 mg | ORAL_TABLET | Freq: Once | ORAL | Status: DC
  Filled 2020-05-31: qty 3

## 2020-05-31 MED ORDER — SODIUM CHLORIDE 0.9 % IV SOLN: 100.0000 mL | INTRAVENOUS | Status: DC | PRN

## 2020-05-31 MED ORDER — LIDOCAINE-PRILOCAINE 2.5-2.5 % EX CREA
1.0000 "application " | TOPICAL_CREAM | CUTANEOUS | Status: DC | PRN
  Filled 2020-05-31: qty 5

## 2020-05-31 NOTE — Progress Notes (Signed)
Patient patient  requested to have feet dangle on sided of stretcher. Refused to place feet back in stretcher timely, advised a safety issue to remain dangling. Patient is aware of all risks.

## 2020-05-31 NOTE — ED Notes (Signed)
This nurse reached out to hemodialysis per pt request r/t when he will get dialysis, informed should be around 12pm today. Will continue to monitor.

## 2020-05-31 NOTE — ED Notes (Signed)
Attempted to get pt's oral temp X 2. Pt moved multiple times. Unable get oral temp. Attempted to get an axillary temp, with no success. Attempted a second axillary temp, but pt refused. Pt informed that the refusal will be documented in chart.
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
## 2020-05-31 NOTE — ED Notes (Signed)
Patient refused vital signs at this time

## 2020-05-31 NOTE — ED Notes (Signed)
Patient refusing phlebotomy by anyone who doesn't have an ultrasound.

## 2020-05-31 NOTE — ED Notes (Signed)
Pt st's he thinks his blood sugar is dropping    CBG ordered.

## 2020-05-31 NOTE — ED Notes (Signed)
IV team requested; on call back, IV team says they cannot successful\y draw blood from this patient; physician advised.

## 2020-05-31 NOTE — ED Notes (Signed)
Received call from HD stating they are ready for pt, pt transported to receive dialysis.

## 2020-05-31 NOTE — ED Notes (Signed)
Pt currently refusing all care, including VS and blood draw.

## 2020-05-31 NOTE — ED Notes (Signed)
Attempted to check VS, pt refused.

## 2020-05-31 NOTE — ED Notes (Signed)
Pt noted not in hallway bed, NT states pt he saw the patient walk out to the lobby.

## 2020-05-31 NOTE — ED Notes (Signed)
Pt refused to be swabbed for COVID; says he will only agree if he swabs himself; pt swabbed outer edge of right nostril.

## 2020-05-31 NOTE — ED Notes (Signed)
Pt states BGL is low; per glucometer, BGL is 70; provider advised and says pt may have snack; per pt request, pt given cheese, crackers, and apple juice.

## 2020-05-31 NOTE — ED Notes (Signed)
Discussed blood draw with Dr. Dina Rich; says pt blood can be drawn with dialysis.  Pt so informed and is asking when his IV will be placed.  Pt advised that MD does not need for patient to have IV and none will be placed.

## 2020-05-31 NOTE — ED Notes (Signed)
Pt standing at bedside; refuses to return to stretcher.

## 2020-06-03 ENCOUNTER — Other Ambulatory Visit: Payer: Self-pay

## 2020-06-03 ENCOUNTER — Emergency Department (HOSPITAL_COMMUNITY)
Admission: EM | Admit: 2020-06-03 | Discharge: 2020-06-03 | Disposition: A | Discharge status: Home / Self Care | Payer: Medicare Other | Attending: Emergency Medicine | Admitting: Emergency Medicine

## 2020-06-03 ENCOUNTER — Encounter (HOSPITAL_COMMUNITY): Payer: Self-pay | Admitting: Emergency Medicine

## 2020-06-03 DIAGNOSIS — Z87891 Personal history of nicotine dependence: Secondary | ICD-10-CM | POA: Insufficient documentation

## 2020-06-03 DIAGNOSIS — Z20822 Contact with and (suspected) exposure to covid-19: Secondary | ICD-10-CM | POA: Insufficient documentation

## 2020-06-03 DIAGNOSIS — I132 Hypertensive heart and chronic kidney disease with heart failure and with stage 5 chronic kidney disease, or end stage renal disease: Secondary | ICD-10-CM | POA: Diagnosis not present

## 2020-06-03 DIAGNOSIS — N186 End stage renal disease: Secondary | ICD-10-CM | POA: Diagnosis not present

## 2020-06-03 DIAGNOSIS — Z7901 Long term (current) use of anticoagulants: Secondary | ICD-10-CM | POA: Insufficient documentation

## 2020-06-03 DIAGNOSIS — Z992 Dependence on renal dialysis: Secondary | ICD-10-CM | POA: Insufficient documentation

## 2020-06-03 DIAGNOSIS — J441 Chronic obstructive pulmonary disease with (acute) exacerbation: Secondary | ICD-10-CM | POA: Insufficient documentation

## 2020-06-03 DIAGNOSIS — R609 Edema, unspecified: Secondary | ICD-10-CM | POA: Diagnosis not present

## 2020-06-03 DIAGNOSIS — M5440 Lumbago with sciatica, unspecified side: Secondary | ICD-10-CM | POA: Diagnosis present

## 2020-06-03 DIAGNOSIS — Z79899 Other long term (current) drug therapy: Secondary | ICD-10-CM | POA: Insufficient documentation

## 2020-06-03 DIAGNOSIS — I5042 Chronic combined systolic (congestive) and diastolic (congestive) heart failure: Secondary | ICD-10-CM | POA: Diagnosis not present

## 2020-06-03 LAB — CBG MONITORING, ED
Glucose-Capillary: 54 mg/dL — ABNORMAL LOW (ref 70–99)
Glucose-Capillary: 60 mg/dL — ABNORMAL LOW (ref 70–99)
Glucose-Capillary: 66 mg/dL — ABNORMAL LOW (ref 70–99)
Glucose-Capillary: 73 mg/dL (ref 70–99)
Glucose-Capillary: 91 mg/dL (ref 70–99)

## 2020-06-03 LAB — RENAL FUNCTION PANEL
Albumin: 3.1 g/dL — ABNORMAL LOW (ref 3.5–5.0)
Anion gap: 14 (ref 5–15)
BUN: 68 mg/dL — ABNORMAL HIGH (ref 6–20)
CO2: 23 mmol/L (ref 22–32)
Calcium: 8.2 mg/dL — ABNORMAL LOW (ref 8.9–10.3)
Chloride: 99 mmol/L (ref 98–111)
Creatinine, Ser: 13.36 mg/dL — ABNORMAL HIGH (ref 0.61–1.24)
GFR, Estimated: 4 mL/min — ABNORMAL LOW (ref >60)
Glucose, Bld: 116 mg/dL — ABNORMAL HIGH (ref 70–99)
Phosphorus: 6.9 mg/dL — ABNORMAL HIGH (ref 2.5–4.6)
Potassium: 4.6 mmol/L (ref 3.5–5.1)
Sodium: 136 mmol/L (ref 135–145)

## 2020-06-03 LAB — CBC
HCT: 28.5 % — ABNORMAL LOW (ref 39.0–52.0)
Hemoglobin: 8.6 g/dL — ABNORMAL LOW (ref 13.0–17.0)
MCH: 25.8 pg — ABNORMAL LOW (ref 26.0–34.0)
MCHC: 30.2 g/dL (ref 30.0–36.0)
MCV: 85.6 fL (ref 80.0–100.0)
Platelets: 93 10*3/uL — ABNORMAL LOW (ref 150–400)
RBC: 3.33 MIL/uL — ABNORMAL LOW (ref 4.22–5.81)
RDW: 19.1 % — ABNORMAL HIGH (ref 11.5–15.5)
WBC: 3.2 10*3/uL — ABNORMAL LOW (ref 4.0–10.5)
nRBC: 0 % (ref 0.0–0.2)

## 2020-06-03 LAB — RESPIRATORY PANEL BY RT PCR (FLU A&B, COVID)
Influenza A by PCR: NEGATIVE
Influenza B by PCR: NEGATIVE
SARS Coronavirus 2 by RT PCR: NEGATIVE

## 2020-06-03 MED ORDER — SODIUM CHLORIDE 0.9 % IV SOLN: 100.0000 mL | INTRAVENOUS | Status: DC | PRN

## 2020-06-03 MED ORDER — ALTEPLASE 2 MG IJ SOLR: 2.0000 mg | Freq: Once | INTRAMUSCULAR | Status: DC | PRN

## 2020-06-03 MED ORDER — HEPARIN SODIUM (PORCINE) 1000 UNIT/ML DIALYSIS
1000.0000 [IU] | INTRAMUSCULAR | Status: DC | PRN
  Filled 2020-06-03 (×2): qty 1

## 2020-06-03 MED ORDER — HEPARIN SODIUM (PORCINE) 1000 UNIT/ML IJ SOLN
INTRAMUSCULAR | Status: AC
  Administered 2020-06-03: 5000 [IU] via INTRAVENOUS_CENTRAL
  Filled 2020-06-03: qty 5

## 2020-06-03 MED ORDER — KETOROLAC TROMETHAMINE 60 MG/2ML IM SOLN: 60.0000 mg | Freq: Once | INTRAMUSCULAR | Status: DC

## 2020-06-03 MED ORDER — OXYCODONE HCL 5 MG PO TABS
15.0000 mg | ORAL_TABLET | ORAL | Status: DC
  Administered 2020-06-03: 15 mg via ORAL
  Filled 2020-06-03: qty 3

## 2020-06-03 MED ORDER — LIDOCAINE HCL (PF) 1 % IJ SOLN: 5.0000 mL | INTRAMUSCULAR | Status: DC | PRN

## 2020-06-03 MED ORDER — ONDANSETRON 4 MG PO TBDP
8.0000 mg | ORAL_TABLET | Freq: Once | ORAL | Status: AC
  Administered 2020-06-03: 8 mg via ORAL
  Filled 2020-06-03: qty 2

## 2020-06-03 MED ORDER — LIDOCAINE-PRILOCAINE 2.5-2.5 % EX CREA
1.0000 "application " | TOPICAL_CREAM | CUTANEOUS | Status: DC | PRN
  Filled 2020-06-03: qty 5

## 2020-06-03 MED ORDER — CHLORHEXIDINE GLUCONATE CLOTH 2 % EX PADS: 6.0000 | MEDICATED_PAD | Freq: Every day | CUTANEOUS | Status: DC

## 2020-06-03 MED ORDER — PENTAFLUOROPROP-TETRAFLUOROETH EX AERO
1.0000 "application " | INHALATION_SPRAY | CUTANEOUS | Status: DC | PRN
  Filled 2020-06-03: qty 116

## 2020-06-03 MED ORDER — APIXABAN 5 MG PO TABS
5.0000 mg | ORAL_TABLET | Freq: Two times a day (BID) | ORAL | Status: DC
  Administered 2020-06-03: 5 mg via ORAL
  Filled 2020-06-03: qty 1

## 2020-06-03 MED ORDER — LISINOPRIL 10 MG PO TABS
5.0000 mg | ORAL_TABLET | Freq: Every day | ORAL | Status: DC
  Administered 2020-06-03: 5 mg via ORAL
  Filled 2020-06-03: qty 1

## 2020-06-03 NOTE — Progress Notes (Signed)
Pt frequently requesting to let rail down on stretcher to sit on side of during hd tx. Pt informed that he will be unable to do so due to increased risk of falling during tx, specifically with fluctuations in blood pressures. Pt informed that significant injury could result if he was to become dizzy and fall on to the floor. Pt is alert and oriented x4. Becoming increasingly agitated with redirection of staff. Will continue to monitor for compliance.

## 2020-06-03 NOTE — Discharge Instructions (Signed)
Take your medicine as prescribed. Please go to dialysis.  Return to ER if you have chest pain or shortness of breath or weakness.

## 2020-06-03 NOTE — Progress Notes (Signed)
   06/03/20 1620  Vitals  Temp 97.7 F (36.5 C)  Temp Source Oral  BP (!) 157/74  MAP (mmHg) 96  BP Location Left Leg  BP Method Automatic  Patient Position (if appropriate) Lying  Pulse Rate 71  ECG Heart Rate 71  Resp 18  Oxygen Therapy  SpO2 92 %  O2 Device Nasal Cannula  O2 Flow Rate (L/min) 2 L/min  Post-Hemodialysis Assessment  Rinseback Volume (mL) 250 mL  KECN 263 V  Dialyzer Clearance Lightly streaked  Duration of HD Treatment -hour(s) 3.5 hour(s)  Hemodialysis Intake (mL) 500 mL  UF Total -Machine (mL) 3222 mL  Net UF (mL) 2722 mL  Tolerated HD Treatment Yes  Post-Hemodialysis Comments tx terminated with 25 min left per pt  Hemodialysis Catheter Left  No placement date or time found.   Placed prior to admission: Yes  Orientation: Left  Site Condition No complications  Blue Lumen Status Flushed;Heparin locked;Capped (Central line)  Red Lumen Status Flushed;Heparin locked;Capped (Central line)  Catheter fill solution Heparin 1000 units/ml  Catheter fill volume (Arterial) 2.6 cc  Catheter fill volume (Venous) 2.4  Dressing Type Occlusive  Dressing Status Dry;Intact  Interventions Dressing changed;Antimicrobial disc changed  Drainage Description None  Dressing Change Due 06/10/20  Post treatment catheter status Capped and Clamped  HD tx terminated with 25 min left per pt request. Pt educated on risks associated with incomplete/insufficient hd, [place]-PA on unit and also spoke with pt regarding importance of tx. Pt stable upon transfer. Refused to sign AMA form.

## 2020-06-03 NOTE — ED Provider Notes (Signed)
  Physical Exam  BP (!) 157/74 (BP Location: Left Leg)   Pulse 71   Temp 97.7 F (36.5 C) (Oral)   Resp 18   SpO2 92%   Physical Exam  ED Course/Procedures     Procedures  MDM  Patient came back from dialysis and blood pressure improved. Patient appears well and will be discharged. We encouraged him to get his dialysis at the dialysis center.   Drenda Freeze, MD 06/03/20 [phone]

## 2020-06-03 NOTE — ED Notes (Signed)
Pt refusing blood work. Pt states "you're not getting my blood unless you use an ultrasound machine."

## 2020-06-03 NOTE — ED Notes (Signed)
Pt refusing vitals and to be hooked up to the monitor. Pt states he would like to "warm up for a little bit."

## 2020-06-03 NOTE — ED Notes (Signed)
Pt given apple juice and crackers.

## 2020-06-03 NOTE — ED Triage Notes (Signed)
Patient reports emesis and low back pain this evening , patient also stated that he missed his hemodialysis treatment .

## 2020-06-03 NOTE — Progress Notes (Addendum)
Nephrology brief note, 54 year old male HTN, CHF, chronic pancreatitis ESRD on HD presented to ER in need of dialysis.  Patient has been involuntary discharge from outpatient dialysis and usually comes to ER for HD need.  Now he is having hypertension, shortness of breath.  He refused labs in the ER.  I saw him in the ER.  Sitting on bed, complaining of SOB. Plan for HD today, UF 4 to 5 L. Discharge after completion of dialysis from ER.

## 2020-06-03 NOTE — ED Notes (Signed)
Pt rang call bell and asked for RN. Writer went into room and patient said "I don't feel well". Attempted to get more information from patient but patient refusing to elaborate. Asked patient if he was in pain, nauseas, tired, cold, dizzy, etc. Pt just said "no." Asked patient if it would be okay if we tried to get blood from patient and explained that it would be helpful to be able to see patients labs so we could better understand what is going on with the patient since the patient is a dialysis patient. Pt refused. Pt had removed SPO2 monitor. Explained to patient that it was important that we monitor his SPO2 and put a new SPO2 monitor on patient. Pt currently AOx4, sitting on the side of the bed, speaking in complete sentences and appears in NAD. Pt VSS, heart rhythm is NSR w/ infrequent PVC's at this time. Will continue to monitor.
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
## 2020-06-03 NOTE — ED Notes (Addendum)
Pt refusing blood work until after he drinks apple juice.

## 2020-06-03 NOTE — ED Notes (Signed)
Patient poc bg rechecked and it was 66. Apple juice offered.

## 2020-06-03 NOTE — ED Notes (Signed)
Phlebectomy attempted to get blood work. Pt refused again.

## 2020-06-05 ENCOUNTER — Encounter (HOSPITAL_COMMUNITY): Payer: Self-pay | Admitting: Emergency Medicine

## 2020-06-05 ENCOUNTER — Emergency Department (HOSPITAL_COMMUNITY)
Admission: EM | Admit: 2020-06-05 | Discharge: 2020-06-06 | Disposition: A | Discharge status: Home / Self Care | Payer: Medicare Other | Attending: Emergency Medicine | Admitting: Emergency Medicine

## 2020-06-05 DIAGNOSIS — I2510 Atherosclerotic heart disease of native coronary artery without angina pectoris: Secondary | ICD-10-CM | POA: Insufficient documentation

## 2020-06-05 DIAGNOSIS — M25552 Pain in left hip: Secondary | ICD-10-CM | POA: Insufficient documentation

## 2020-06-05 DIAGNOSIS — I132 Hypertensive heart and chronic kidney disease with heart failure and with stage 5 chronic kidney disease, or end stage renal disease: Secondary | ICD-10-CM | POA: Insufficient documentation

## 2020-06-05 DIAGNOSIS — G8929 Other chronic pain: Secondary | ICD-10-CM | POA: Insufficient documentation

## 2020-06-05 DIAGNOSIS — N186 End stage renal disease: Secondary | ICD-10-CM | POA: Diagnosis not present

## 2020-06-05 DIAGNOSIS — Z4931 Encounter for adequacy testing for hemodialysis: Secondary | ICD-10-CM | POA: Diagnosis present

## 2020-06-05 DIAGNOSIS — R110 Nausea: Secondary | ICD-10-CM | POA: Diagnosis not present

## 2020-06-05 DIAGNOSIS — Z79899 Other long term (current) drug therapy: Secondary | ICD-10-CM | POA: Insufficient documentation

## 2020-06-05 DIAGNOSIS — M25551 Pain in right hip: Secondary | ICD-10-CM | POA: Diagnosis not present

## 2020-06-05 DIAGNOSIS — M545 Low back pain, unspecified: Secondary | ICD-10-CM | POA: Insufficient documentation

## 2020-06-05 DIAGNOSIS — Z7901 Long term (current) use of anticoagulants: Secondary | ICD-10-CM | POA: Insufficient documentation

## 2020-06-05 DIAGNOSIS — J441 Chronic obstructive pulmonary disease with (acute) exacerbation: Secondary | ICD-10-CM | POA: Diagnosis not present

## 2020-06-05 DIAGNOSIS — Z951 Presence of aortocoronary bypass graft: Secondary | ICD-10-CM | POA: Insufficient documentation

## 2020-06-05 DIAGNOSIS — Z20822 Contact with and (suspected) exposure to covid-19: Secondary | ICD-10-CM | POA: Insufficient documentation

## 2020-06-05 DIAGNOSIS — E1122 Type 2 diabetes mellitus with diabetic chronic kidney disease: Secondary | ICD-10-CM | POA: Diagnosis not present

## 2020-06-05 DIAGNOSIS — Z992 Dependence on renal dialysis: Secondary | ICD-10-CM

## 2020-06-05 DIAGNOSIS — I5023 Acute on chronic systolic (congestive) heart failure: Secondary | ICD-10-CM | POA: Diagnosis not present

## 2020-06-05 DIAGNOSIS — Z87891 Personal history of nicotine dependence: Secondary | ICD-10-CM | POA: Diagnosis not present

## 2020-06-05 DIAGNOSIS — E162 Hypoglycemia, unspecified: Secondary | ICD-10-CM

## 2020-06-05 LAB — CBG MONITORING, ED
Glucose-Capillary: 64 mg/dL — ABNORMAL LOW (ref 70–99)
Glucose-Capillary: 99 mg/dL (ref 70–99)

## 2020-06-05 MED ORDER — LIDOCAINE 5 % EX PTCH: 1.0000 | MEDICATED_PATCH | CUTANEOUS | Status: DC

## 2020-06-05 MED ORDER — CHLORHEXIDINE GLUCONATE CLOTH 2 % EX PADS: 6.0000 | MEDICATED_PAD | Freq: Every day | CUTANEOUS | Status: DC

## 2020-06-05 MED ORDER — OXYCODONE HCL 5 MG PO TABS
10.0000 mg | ORAL_TABLET | Freq: Once | ORAL | Status: AC
  Administered 2020-06-05: 10 mg via ORAL
  Filled 2020-06-05: qty 2

## 2020-06-05 MED ORDER — ONDANSETRON 4 MG PO TBDP
4.0000 mg | ORAL_TABLET | Freq: Once | ORAL | Status: AC
  Administered 2020-06-05: 4 mg via ORAL
  Filled 2020-06-05: qty 1

## 2020-06-05 NOTE — Progress Notes (Signed)
Called Oretha Caprice, RN and informed hi that Dr. Pearson Grippe wants a CBC and renal panel to be collected and sent to lab.

## 2020-06-05 NOTE — ED Triage Notes (Signed)
Pt states he is here for dialysis, last session Thursday. Also c/o back pain and nausea. Patient in ED regularly for dialysis treatments due to not having a center to be dialyzed at.
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
## 2020-06-05 NOTE — ED Notes (Signed)
Pt requesting blood sugar be checked, states it was 65 this morning at home, reports he did eat en route, cbg 64 upon arrival, pt provided juice. Pt a/ox4, nad.

## 2020-06-05 NOTE — ED Provider Notes (Signed)
Assumed care of patient at change of shift, awaiting dialysis with plan to return to ER for likely discharge.   Physical Exam  BP (!) 193/86 (BP Location: Right Leg)   Pulse 71   Temp 97.9 F (36.6 C) (Oral)   Resp 20   SpO2 97%   Physical Exam  ED Course/Procedures     Procedures  MDM  Patient has refused labs prior to dialysis, plan is for patient to be held in the ER for dialysis in the morning.  2030hrs, patient with 1 episode of vomiting, plan is for zofran ODT, IV team order from RN to obtain access and labs for further meds. Lidoderm patch ordered for back pain.  2100hrs, patient now refuses IV if pain medications are not going to be ordered IV.       Tacy Learn, PA-C 06/05/20 2124    Truddie Hidden, MD 06/05/20 2240

## 2020-06-05 NOTE — ED Notes (Signed)
Pt called out asking for a tray to be ordered. This nurse obtained verbal order to place renal diet order.  Secretary notified.

## 2020-06-05 NOTE — ED Notes (Signed)
Pt refused EKG. Was not amiable to CBG.

## 2020-06-05 NOTE — ED Notes (Signed)
Pt requesting and given apple juice with ice, saltine crackers, graham crackers, apple sauce and heated up [place] sandwich. Pt also complaining of feeling nausea and 8/10 back pain.

## 2020-06-05 NOTE — Progress Notes (Signed)
Asked to see pt for HD today.  Pt has not outpatient HD unit at this time.  Here for dialysis.   Plan for dc after HD back to ED.    Last OP HD orders Aug 2021 >>     4h  TDC   79kg  Hep none   2/2 bath   Kelly Splinter, MD 06/05/2020, 1:51 PM

## 2020-06-05 NOTE — ED Notes (Signed)
Pt continues to refused to allow staff to draw his blood, states he is here for dialysis and they will draw enough blood there.  C/o chronic back pain 8/10.

## 2020-06-05 NOTE — ED Notes (Signed)
This nurse witnessed the pt refuse to have IV placed while IV team was at bedside.

## 2020-06-05 NOTE — ED Notes (Signed)
Dialysis called to state that Blood draw needed to determine whether dialysis is needled tonight or in the morning. Pt expressed that he wants blood drawn at dialysis in the morning and that he will wait until morning unless he gets worse.  Dialysis has been notified.

## 2020-06-05 NOTE — Progress Notes (Signed)
At pt.'s room and pt. Stated " I refused IV insertion, i'm not getting medicine anyway." RN called to bedside and pt. Once again confirmed of the refusal for IV insertion.

## 2020-06-06 DIAGNOSIS — Z992 Dependence on renal dialysis: Secondary | ICD-10-CM | POA: Insufficient documentation

## 2020-06-06 DIAGNOSIS — Z4931 Encounter for adequacy testing for hemodialysis: Secondary | ICD-10-CM | POA: Diagnosis not present

## 2020-06-06 DIAGNOSIS — I132 Hypertensive heart and chronic kidney disease with heart failure and with stage 5 chronic kidney disease, or end stage renal disease: Secondary | ICD-10-CM | POA: Insufficient documentation

## 2020-06-06 DIAGNOSIS — E1122 Type 2 diabetes mellitus with diabetic chronic kidney disease: Secondary | ICD-10-CM | POA: Insufficient documentation

## 2020-06-06 DIAGNOSIS — Z87891 Personal history of nicotine dependence: Secondary | ICD-10-CM | POA: Insufficient documentation

## 2020-06-06 DIAGNOSIS — J441 Chronic obstructive pulmonary disease with (acute) exacerbation: Secondary | ICD-10-CM | POA: Insufficient documentation

## 2020-06-06 DIAGNOSIS — Z20822 Contact with and (suspected) exposure to covid-19: Secondary | ICD-10-CM | POA: Insufficient documentation

## 2020-06-06 DIAGNOSIS — N186 End stage renal disease: Secondary | ICD-10-CM | POA: Insufficient documentation

## 2020-06-06 DIAGNOSIS — R110 Nausea: Secondary | ICD-10-CM | POA: Insufficient documentation

## 2020-06-06 DIAGNOSIS — M25552 Pain in left hip: Secondary | ICD-10-CM | POA: Insufficient documentation

## 2020-06-06 DIAGNOSIS — M25551 Pain in right hip: Secondary | ICD-10-CM | POA: Insufficient documentation

## 2020-06-06 DIAGNOSIS — Z79899 Other long term (current) drug therapy: Secondary | ICD-10-CM | POA: Insufficient documentation

## 2020-06-06 DIAGNOSIS — I5023 Acute on chronic systolic (congestive) heart failure: Secondary | ICD-10-CM | POA: Insufficient documentation

## 2020-06-06 LAB — RENAL FUNCTION PANEL
Albumin: 3 g/dL — ABNORMAL LOW (ref 3.5–5.0)
Anion gap: 13 (ref 5–15)
BUN: 61 mg/dL — ABNORMAL HIGH (ref 6–20)
CO2: 25 mmol/L (ref 22–32)
Calcium: 8.5 mg/dL — ABNORMAL LOW (ref 8.9–10.3)
Chloride: 100 mmol/L (ref 98–111)
Creatinine, Ser: 11.72 mg/dL — ABNORMAL HIGH (ref 0.61–1.24)
GFR, Estimated: 5 mL/min — ABNORMAL LOW (ref >60)
Glucose, Bld: 73 mg/dL (ref 70–99)
Phosphorus: 6.1 mg/dL — ABNORMAL HIGH (ref 2.5–4.6)
Potassium: 4.8 mmol/L (ref 3.5–5.1)
Sodium: 138 mmol/L (ref 135–145)

## 2020-06-06 LAB — CBC
HCT: 26.1 % — ABNORMAL LOW (ref 39.0–52.0)
Hemoglobin: 8 g/dL — ABNORMAL LOW (ref 13.0–17.0)
MCH: 26.1 pg (ref 26.0–34.0)
MCHC: 30.7 g/dL (ref 30.0–36.0)
MCV: 85.3 fL (ref 80.0–100.0)
Platelets: 95 10*3/uL — ABNORMAL LOW (ref 150–400)
RBC: 3.06 MIL/uL — ABNORMAL LOW (ref 4.22–5.81)
RDW: 18.9 % — ABNORMAL HIGH (ref 11.5–15.5)
WBC: 4.4 10*3/uL (ref 4.0–10.5)
nRBC: 0 % (ref 0.0–0.2)

## 2020-06-06 LAB — GLUCOSE, CAPILLARY: Glucose-Capillary: 69 mg/dL — ABNORMAL LOW (ref 70–99)

## 2020-06-06 LAB — CBG MONITORING, ED
Glucose-Capillary: 60 mg/dL — ABNORMAL LOW (ref 70–99)
Glucose-Capillary: 66 mg/dL — ABNORMAL LOW (ref 70–99)
Glucose-Capillary: 103 mg/dL — ABNORMAL HIGH (ref 70–99)
Glucose-Capillary: 91 mg/dL (ref 70–99)

## 2020-06-06 IMAGING — CT CT ABD-PELV W/O CM
2 of 4 series · 15 of 46 positions shown, 17 images · non-contrast
Comparison: 12/01/2017, 08/25/2017, 06/24/2017, 04/20/2017,
07/29/2016 CT abdomen and pelvis.

CLINICAL DATA: 51 y/o M; right upper quadrant abdominal pain and
vomiting.

EXAM:
CT ABDOMEN AND PELVIS WITHOUT CONTRAST
TECHNIQUE: Multidetector CT imaging of the abdomen and pelvis was performed
following the standard protocol without IV contrast.

[Series 3: ap without · axial · non-contrast · 0.65mm/px · z∈[+684,+1079]mm · 12 of 89 slices shown, 14 images]
[im 5/89  soft-tissue]
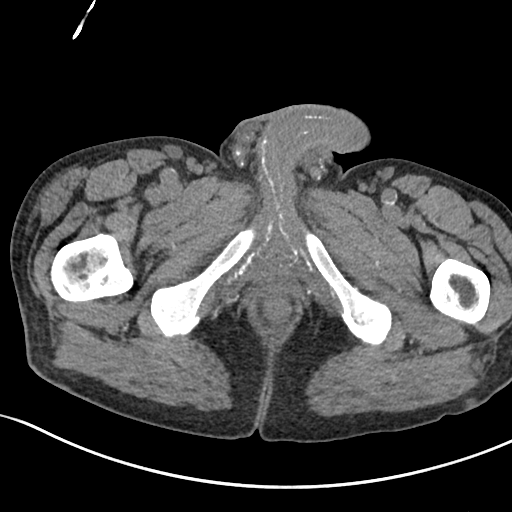
[im 5/89  bone]
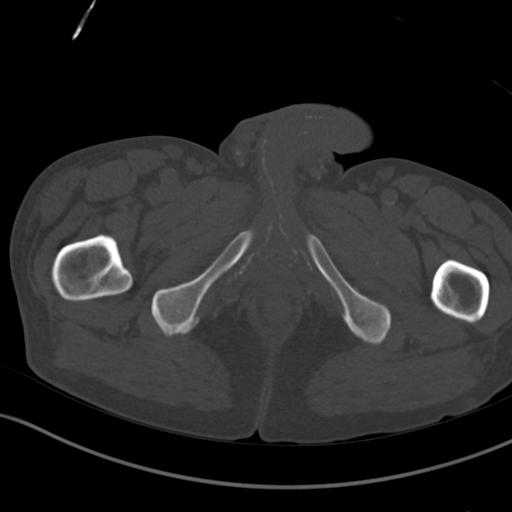
[im 13/89  soft-tissue]
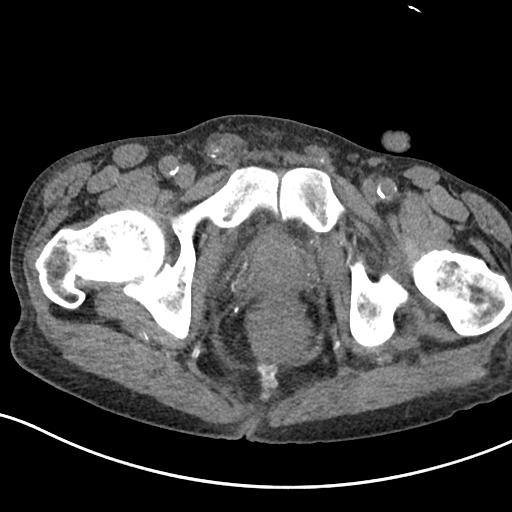
[im 21/89  soft-tissue]
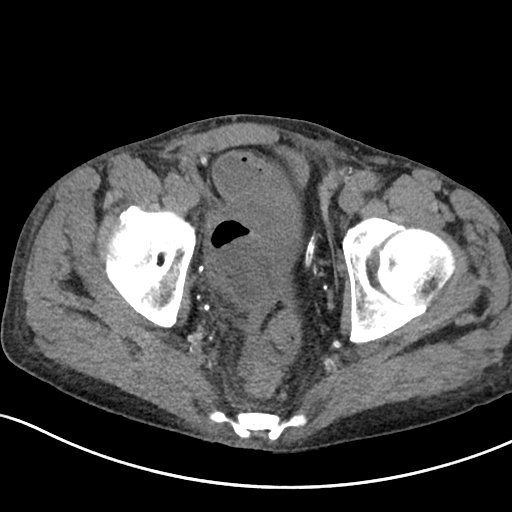
[im 26/89  soft-tissue]
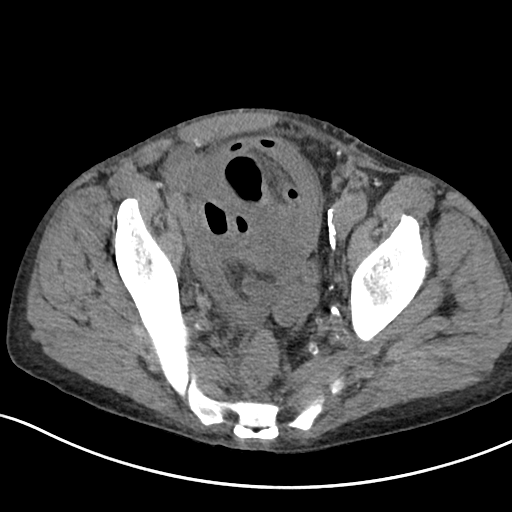
[im 34/89  soft-tissue]
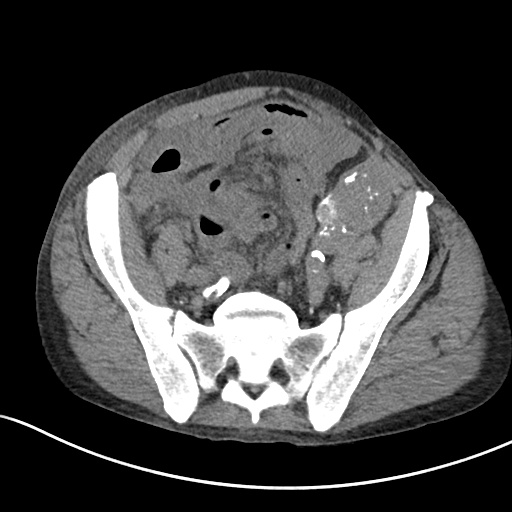
[im 42/89  soft-tissue]
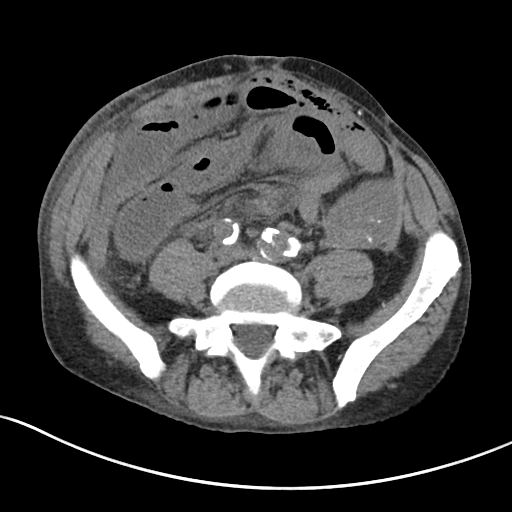
[im 47/89  soft-tissue]
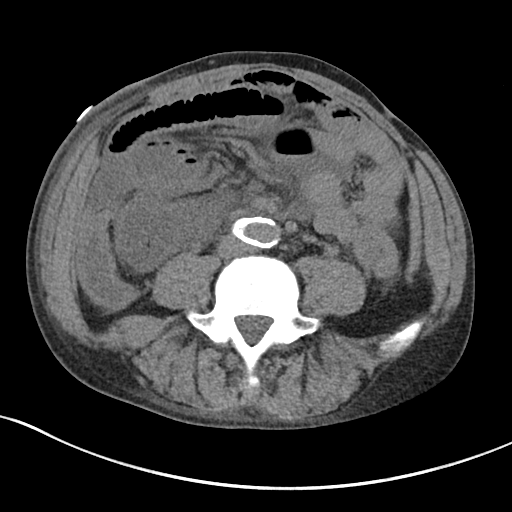
[im 55/89  soft-tissue]
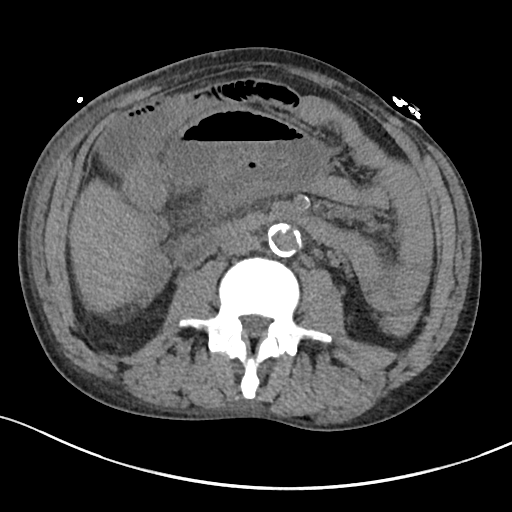
[im 63/89  soft-tissue]
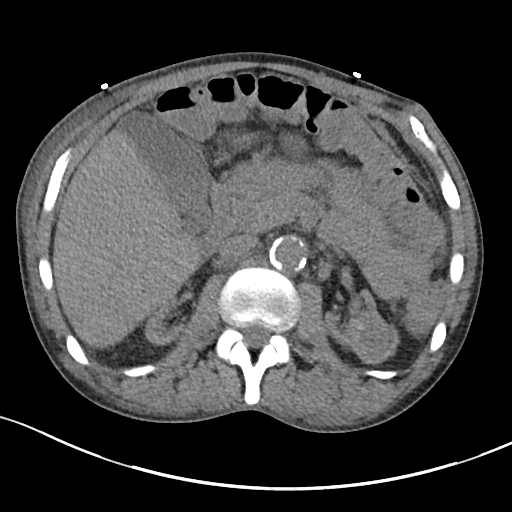
[im 63/89  bone]
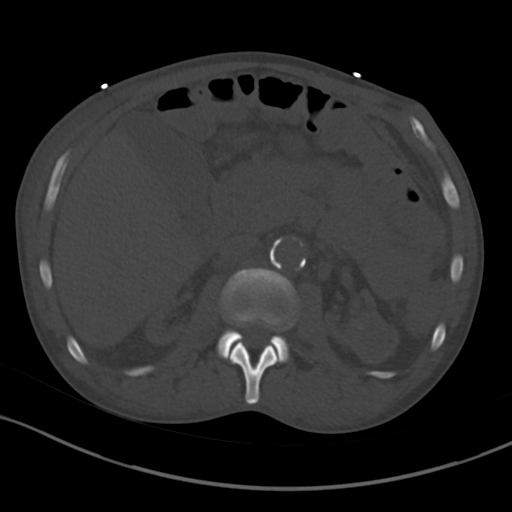
[im 68/89  soft-tissue]
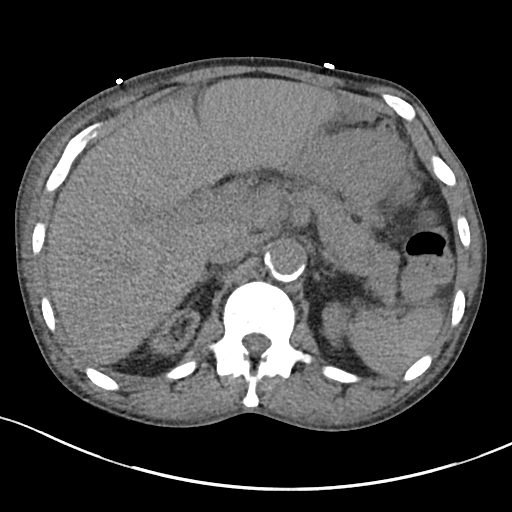
[im 76/89  soft-tissue]
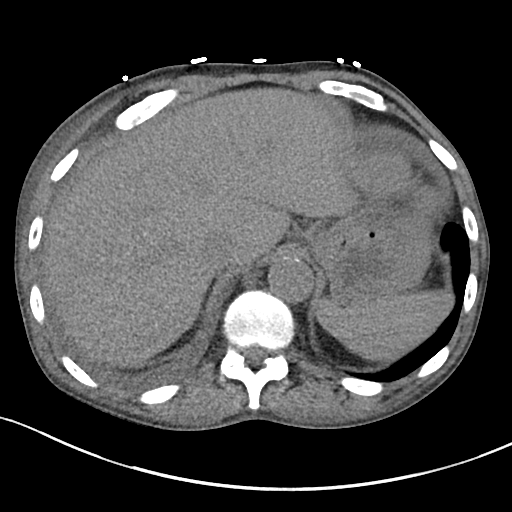
[im 84/89  soft-tissue]
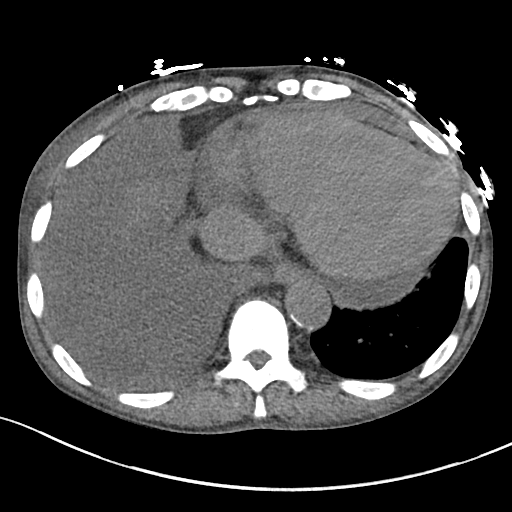

[Series 6: cor · coronal · 0.66mm/px · 3 of 92 slices shown]
[im 31/92  soft-tissue]
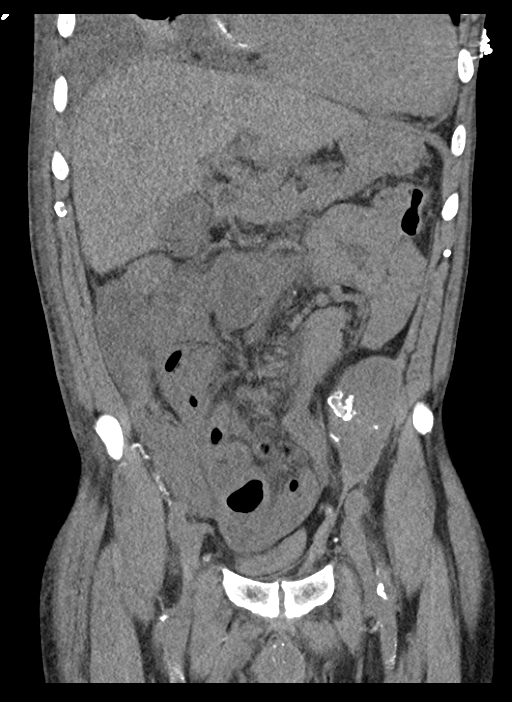
[im 41/92  soft-tissue]
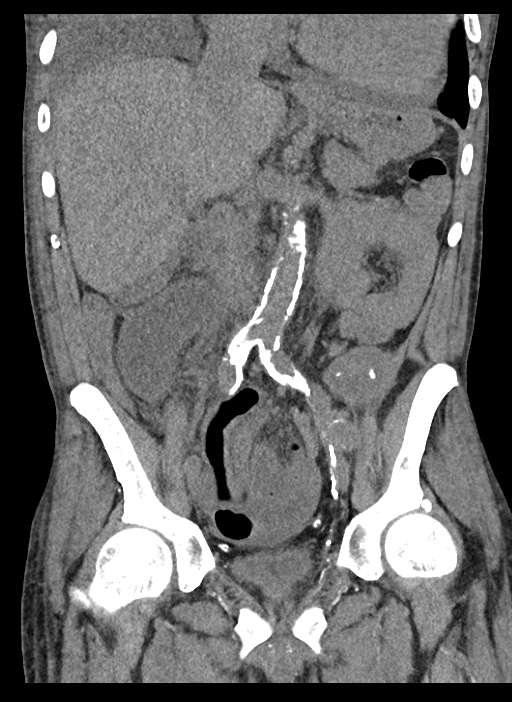
[im 51/92  soft-tissue]
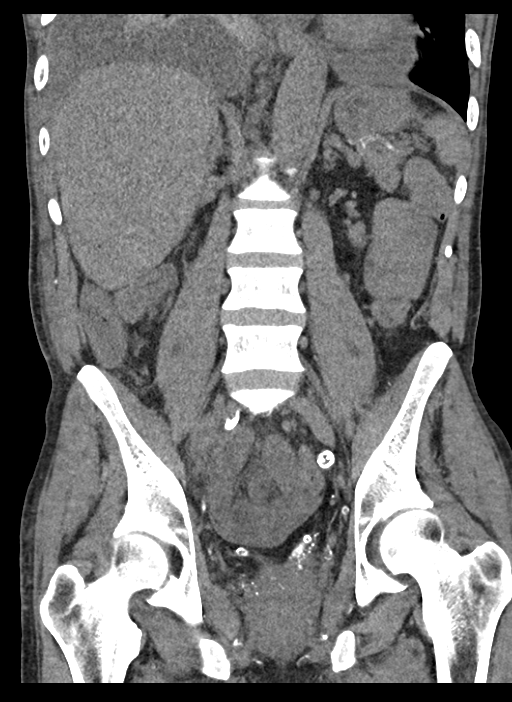

[15 of 46 positions shown; findings below may reference images not displayed]

FINDINGS: Lower chest: Large right pleural effusion. Stable pericardial
effusion and cardiomegaly.

Hepatobiliary: No focal liver abnormality is seen. No gallstones,
gallbladder wall thickening, or biliary dilatation.

Pancreas: Unremarkable. No pancreatic ductal dilatation or
surrounding inflammatory changes.

Spleen: Normal in size without focal abnormality.

Adrenals/Urinary Tract: Normal adrenal glands. Atrophic kidneys.
Left kidney indeterminate lesion measuring 3.3 cm, stable to
decreased in size in comparison with prior abdomen and pelvis CTs.
Left pelvic renal transplant with extensive vascular calcification,
atrophy, and diffuse loss of normal architecture.

Stomach/Bowel: No obstructive or inflammatory changes of the large
bowel. Normal stomach. Multiple dilated loops of small bowel in the
mid abdomen with wall thickening. No findings of perforation.

Vascular/Lymphatic: Aortic atherosclerosis. No enlarged abdominal or
pelvic lymph nodes.

Reproductive: Prostate is unremarkable.

Other: Small volume of ascites amongst small-bowel and mesenteric
edema.

Musculoskeletal: No fracture is seen.
IMPRESSION: 1. Dilated loops of small bowel in the mid abdomen with wall
thickening likely representing small bowel obstruction. No findings
of perforation.
2. Small volume of ascites amongst small-bowel. Mild mesenteric
edema.
3. Large right pleural effusion increased from prior CT. Stable
small pericardial effusion and cardiomegaly.
4. Left kidney indeterminate lesion stable to decreased in size in
comparison with prior CTs of the abdomen and pelvis.
5. Aortic atherosclerosis.

By: Prerna Loja M.D.

## 2020-06-06 IMAGING — DX DG ABDOMEN ACUTE W/ 1V CHEST
3 series · 3 of 3 positions shown · non-contrast
Comparison: CXR 01/05/2018

CLINICAL DATA: Left upper quadrant abdominal pain. Patient went to
dialysis and pain started shortly thereafter.

EXAM:
DG ABDOMEN ACUTE W/ 1V CHEST

[abdomen erect]
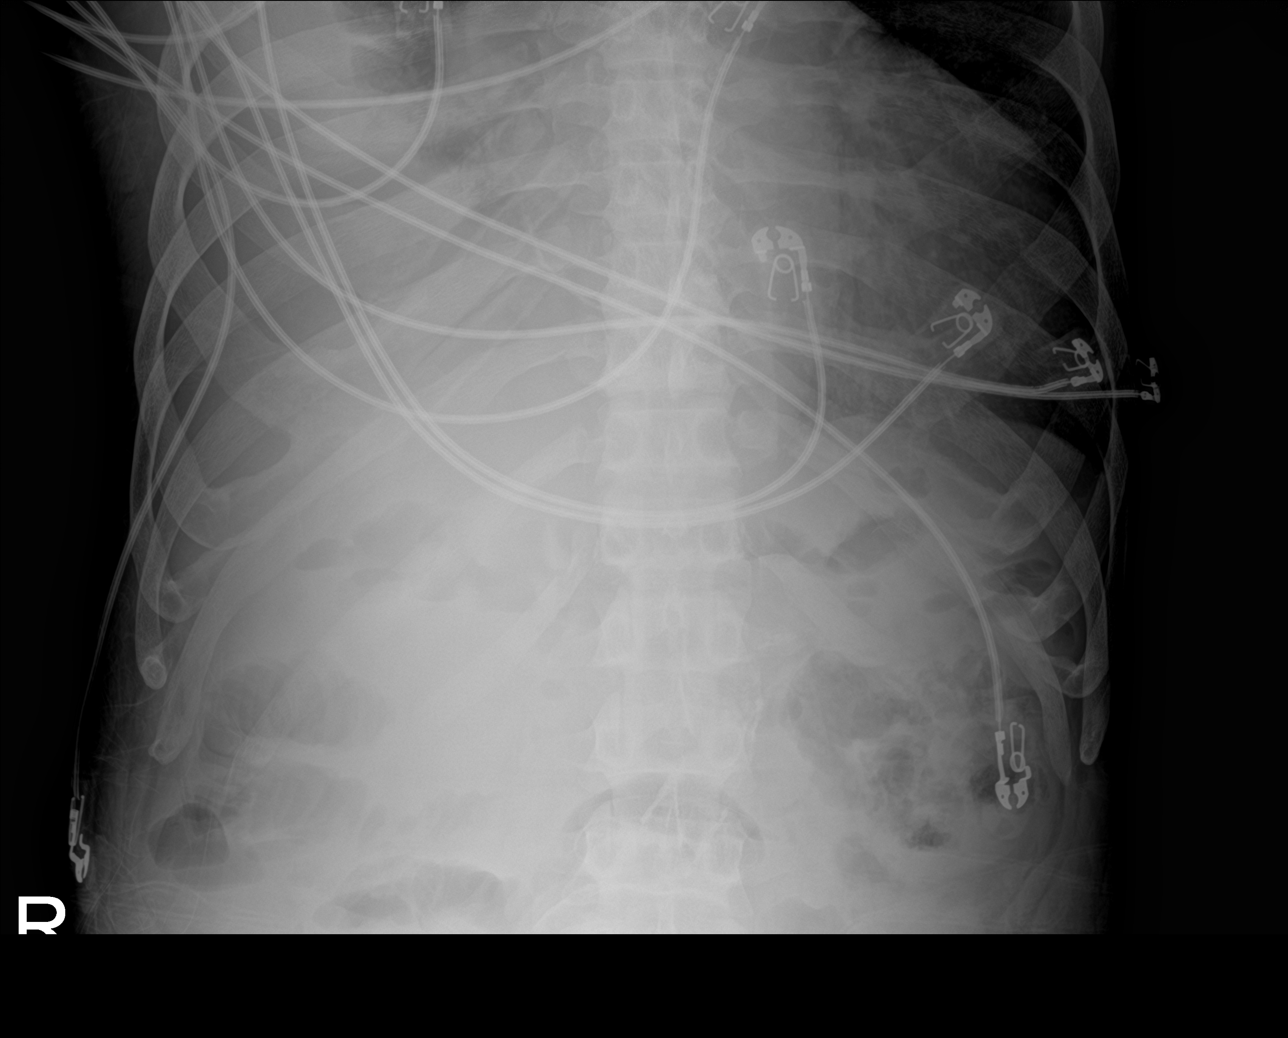

[abdomen supine]
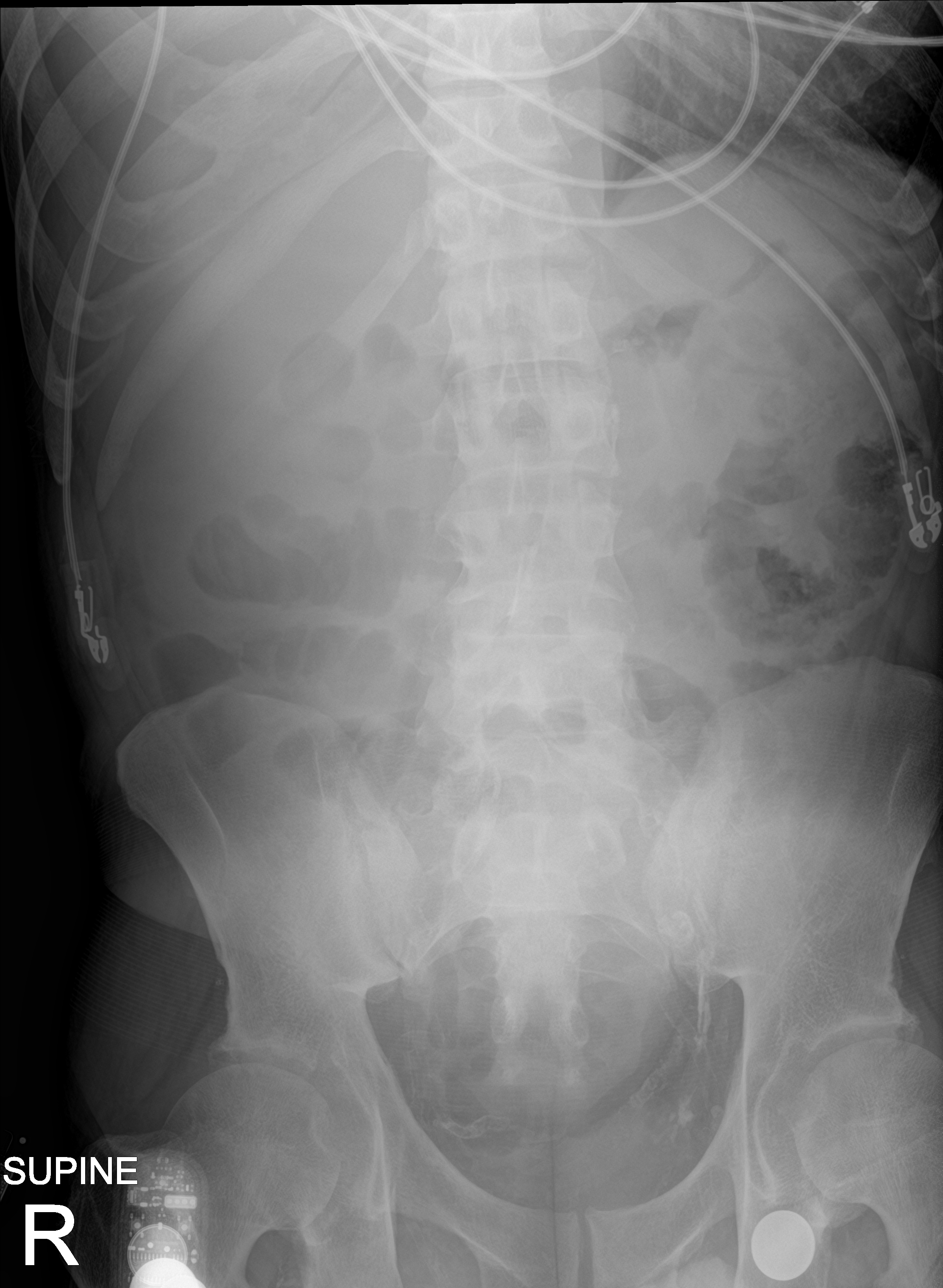

[chest ap]
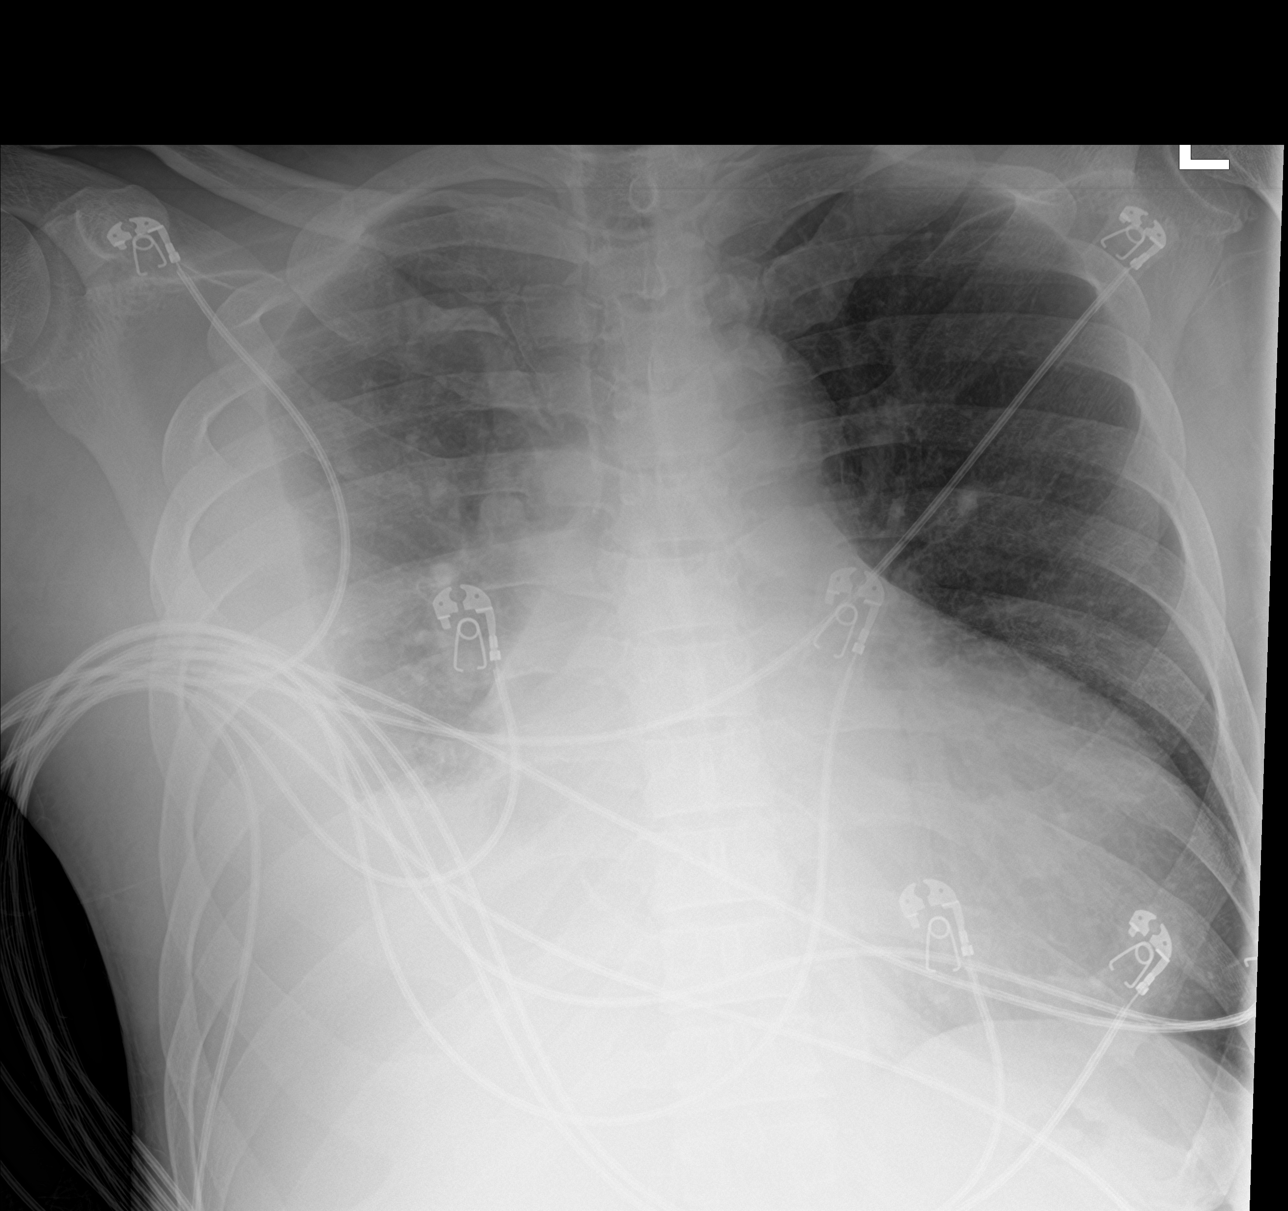

[3 of 3 positions shown; findings below may reference images not displayed]

FINDINGS: AP view of the chest demonstrates cardiomegaly with loculated
moderate to large right pleural effusion along the periphery of the
right hemithorax. Supine and upright views of the abdomen
demonstrate mild gaseous distention of small bowel with air-fluid
levels on the upright suspicious for early or partial SBO versus
ileus. No free air. No radiopaque calculi nor acute osseous
abnormality.
IMPRESSION: 1. Cardiomegaly with moderate to large right loculated pleural
effusion.
2. Abnormal bowel gas pattern with mild to moderately distended
small bowel with air-fluid levels suspicious for early or partial
SBO versus small bowel ileus.

## 2020-06-06 MED ORDER — HEPARIN SODIUM (PORCINE) 1000 UNIT/ML IJ SOLN
INTRAMUSCULAR | Status: AC
  Filled 2020-06-06: qty 5

## 2020-06-06 MED ORDER — GLUCOSE 40 % PO GEL: 1.0000 | Freq: Once | ORAL | Status: DC

## 2020-06-06 MED ORDER — ONDANSETRON HCL 4 MG/2ML IJ SOLN
4.0000 mg | Freq: Once | INTRAMUSCULAR | Status: AC
  Administered 2020-06-06: 4 mg via INTRAVENOUS

## 2020-06-06 MED ORDER — ONDANSETRON HCL 4 MG/2ML IJ SOLN
INTRAMUSCULAR | Status: AC
  Filled 2020-06-06: qty 2

## 2020-06-06 MED ORDER — HYDROMORPHONE HCL 1 MG/ML IJ SOLN
0.5000 mg | Freq: Once | INTRAMUSCULAR | Status: AC
  Administered 2020-06-06: 1 mg via INTRAVENOUS

## 2020-06-06 MED ORDER — HYDROMORPHONE HCL 1 MG/ML IJ SOLN
INTRAMUSCULAR | Status: AC
  Filled 2020-06-06: qty 1

## 2020-06-06 MED ORDER — ALBUTEROL SULFATE HFA 108 (90 BASE) MCG/ACT IN AERS
2.0000 | INHALATION_SPRAY | RESPIRATORY_TRACT | Status: DC | PRN
  Administered 2020-06-06: 2 via RESPIRATORY_TRACT
  Filled 2020-06-06 (×2): qty 6.7

## 2020-06-06 MED ORDER — ALBUTEROL SULFATE HFA 108 (90 BASE) MCG/ACT IN AERS: 1.0000 | INHALATION_SPRAY | RESPIRATORY_TRACT | Status: DC | PRN

## 2020-06-06 NOTE — ED Notes (Signed)
Patient OTF in hemodialysis

## 2020-06-06 NOTE — ED Notes (Signed)
Patient taken to dialysis.

## 2020-06-06 NOTE — Procedures (Signed)
   I was present at this dialysis session, have reviewed the session itself and made  appropriate changes Kelly Splinter MD [place] pager [phone]   06/06/2020, 10:24 AM

## 2020-06-06 NOTE — ED Notes (Signed)
Patient verbalizes understanding of discharge instructions. Opportunity for questioning and answers were provided. Armband removed by staff, pt discharged from ED via wheelchair and returned to lobby to go home

## 2020-06-06 NOTE — ED Notes (Signed)
Patient refused vital signs before departing to hemodialysis

## 2020-06-06 NOTE — ED Notes (Addendum)
Patient refused EKG leads and oxygen. Patient's oxygen saturation in high 80's and nurse states that the patient needs to put their oxygen back on because oxygen was up on top of head but patient said "no, my oxygen is fine, I am not wearing this oxygen, my oxygen will go back up, leave me alone and let me sleep so that I can go to dialysis tomorrow." Patient also refuses to have an IV placed.

## 2020-06-06 NOTE — ED Notes (Signed)
Nurse went in patients room to answer the call bell. Patient wanted warm blankets and an apple juice. Patient stated that the nurse "needs to order him a breakfast try right now because he needs his breakfast before dialysis". Blanket and apple juice given. Nurse left room and patient called out again and nurse Ubaldo Glassing answered request to bring more blankets. Patient then complained to [facility] that the blanket touched his face and that she needed to remove it immediately. He then said "you can leave my room now" to [employer].

## 2020-06-06 NOTE — ED Provider Notes (Signed)
Pt arrived back to ED after having dialysis this morning. Normal WOB on exam. BG initially low which I suspect was related to not eating for a while; BG normalized after he ate a meal. Discharged w/ stable VS.    Frank [name], Frank Overland, MD 06/06/20 1448

## 2020-06-07 ENCOUNTER — Other Ambulatory Visit: Payer: Self-pay

## 2020-06-07 ENCOUNTER — Emergency Department (HOSPITAL_COMMUNITY)
Admission: EM | Admit: 2020-06-07 | Discharge: 2020-06-07 | Disposition: A | Discharge status: Home / Self Care | Payer: Medicare Other | Source: Home / Self Care | Attending: Emergency Medicine | Admitting: Emergency Medicine

## 2020-06-07 ENCOUNTER — Emergency Department (HOSPITAL_COMMUNITY): Payer: Medicare Other

## 2020-06-07 ENCOUNTER — Encounter (HOSPITAL_COMMUNITY): Payer: Self-pay

## 2020-06-07 DIAGNOSIS — M25551 Pain in right hip: Secondary | ICD-10-CM

## 2020-06-07 DIAGNOSIS — Z4931 Encounter for adequacy testing for hemodialysis: Secondary | ICD-10-CM | POA: Diagnosis not present

## 2020-06-07 DIAGNOSIS — M25552 Pain in left hip: Secondary | ICD-10-CM

## 2020-06-07 DIAGNOSIS — R110 Nausea: Secondary | ICD-10-CM

## 2020-06-07 LAB — RESPIRATORY PANEL BY RT PCR (FLU A&B, COVID)
Influenza A by PCR: NEGATIVE
Influenza B by PCR: NEGATIVE
SARS Coronavirus 2 by RT PCR: NEGATIVE

## 2020-06-07 LAB — CBG MONITORING, ED: Glucose-Capillary: 94 mg/dL (ref 70–99)

## 2020-06-07 IMAGING — DX DG ABD PORTABLE 1V
1 series · 1 of 1 positions shown · non-contrast
Comparison: CT 01/16/2018 and abdomen radiographs 01/16/2018.

CLINICAL DATA: Small-bowel obstruction, 8 hour delay

EXAM:
PORTABLE ABDOMEN - 1 VIEW

[abdomen kub]
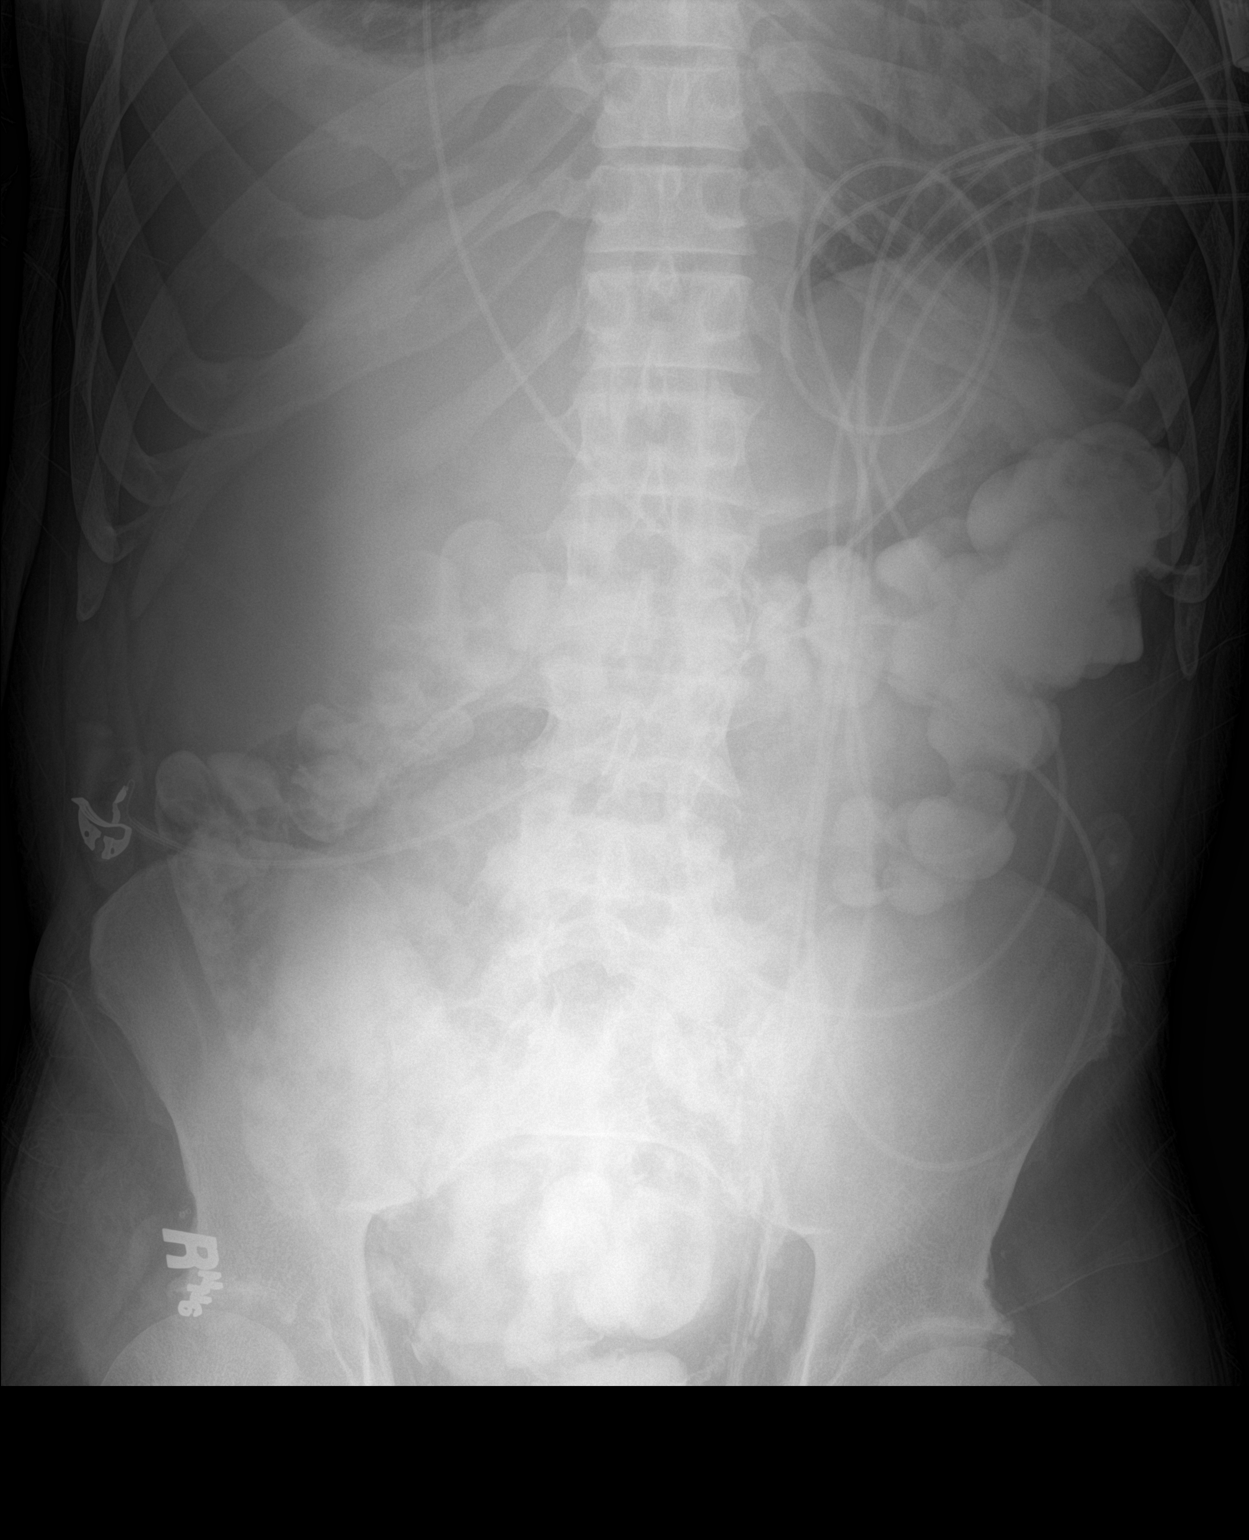

[1 of 1 positions shown; findings below may reference images not displayed]

FINDINGS: Oral contrast is seen within nonobstructed, nondistended large bowel
to the level of the rectum. No significant small bowel dilatation is
seen. No free air. Loculated right effusion.
IMPRESSION: Oral contrast is seen within the colon without obstruction.
Partially included loculated right effusion.

## 2020-06-07 MED ORDER — PROMETHAZINE HCL 25 MG/ML IJ SOLN
12.5000 mg | Freq: Once | INTRAMUSCULAR | Status: AC
  Administered 2020-06-07: 12.5 mg via INTRAVENOUS

## 2020-06-07 MED ORDER — HYDROMORPHONE HCL 1 MG/ML IJ SOLN
1.0000 mg | Freq: Once | INTRAMUSCULAR | Status: AC
  Administered 2020-06-07: 1 mg via INTRAVENOUS

## 2020-06-07 MED ORDER — PROMETHAZINE HCL 25 MG/ML IJ SOLN
25.0000 mg | Freq: Once | INTRAMUSCULAR | Status: DC
  Filled 2020-06-07: qty 1

## 2020-06-07 MED ORDER — PROMETHAZINE HCL 25 MG PO TABS: 25.0000 mg | ORAL_TABLET | Freq: Four times a day (QID) | ORAL | 0 refills | Status: AC | PRN

## 2020-06-07 MED ORDER — HYDROMORPHONE HCL 1 MG/ML IJ SOLN
1.0000 mg | Freq: Once | INTRAMUSCULAR | Status: DC
  Filled 2020-06-07: qty 1

## 2020-06-07 MED ORDER — OXYCODONE HCL 5 MG PO TABS
15.0000 mg | ORAL_TABLET | Freq: Once | ORAL | Status: AC
  Administered 2020-06-07: 15 mg via ORAL
  Filled 2020-06-07: qty 3

## 2020-06-07 NOTE — ED Notes (Signed)
Patient refused to leave at the time of discharge and was given additional time to rest. Patient agreed to leave at 0630 and complied. Patient stated he is going to walk to the cafeteria. Discharged no concerns at this time.
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
## 2020-06-07 NOTE — Discharge Instructions (Addendum)
Continue your regular medications to control your symptoms.   Follow up with your doctor for recheck if symptoms persist.

## 2020-06-07 NOTE — ED Triage Notes (Signed)
Pt complains of bilateral hip pain, he states that he had dialysis today and he's unable to walk, he also states that he's vomited to day and he's unable to eat

## 2020-06-08 ENCOUNTER — Encounter (HOSPITAL_COMMUNITY): Payer: Self-pay | Admitting: *Deleted

## 2020-06-08 ENCOUNTER — Emergency Department (HOSPITAL_COMMUNITY)
Admission: EM | Admit: 2020-06-08 | Discharge: 2020-06-08 | Disposition: A | Discharge status: Home / Self Care | Payer: Medicare Other | Source: Home / Self Care | Attending: Emergency Medicine | Admitting: Emergency Medicine

## 2020-06-08 ENCOUNTER — Other Ambulatory Visit: Payer: Self-pay

## 2020-06-08 DIAGNOSIS — Z7951 Long term (current) use of inhaled steroids: Secondary | ICD-10-CM | POA: Insufficient documentation

## 2020-06-08 DIAGNOSIS — M25551 Pain in right hip: Secondary | ICD-10-CM | POA: Insufficient documentation

## 2020-06-08 DIAGNOSIS — N186 End stage renal disease: Secondary | ICD-10-CM | POA: Insufficient documentation

## 2020-06-08 DIAGNOSIS — Z992 Dependence on renal dialysis: Secondary | ICD-10-CM | POA: Insufficient documentation

## 2020-06-08 DIAGNOSIS — Z79899 Other long term (current) drug therapy: Secondary | ICD-10-CM | POA: Insufficient documentation

## 2020-06-08 DIAGNOSIS — E1169 Type 2 diabetes mellitus with other specified complication: Secondary | ICD-10-CM | POA: Insufficient documentation

## 2020-06-08 DIAGNOSIS — Z7901 Long term (current) use of anticoagulants: Secondary | ICD-10-CM | POA: Insufficient documentation

## 2020-06-08 DIAGNOSIS — Z86711 Personal history of pulmonary embolism: Secondary | ICD-10-CM | POA: Insufficient documentation

## 2020-06-08 DIAGNOSIS — I132 Hypertensive heart and chronic kidney disease with heart failure and with stage 5 chronic kidney disease, or end stage renal disease: Secondary | ICD-10-CM | POA: Insufficient documentation

## 2020-06-08 DIAGNOSIS — E785 Hyperlipidemia, unspecified: Secondary | ICD-10-CM | POA: Insufficient documentation

## 2020-06-08 DIAGNOSIS — Z87891 Personal history of nicotine dependence: Secondary | ICD-10-CM | POA: Insufficient documentation

## 2020-06-08 DIAGNOSIS — E1122 Type 2 diabetes mellitus with diabetic chronic kidney disease: Secondary | ICD-10-CM | POA: Insufficient documentation

## 2020-06-08 DIAGNOSIS — Z86718 Personal history of other venous thrombosis and embolism: Secondary | ICD-10-CM | POA: Insufficient documentation

## 2020-06-08 DIAGNOSIS — J441 Chronic obstructive pulmonary disease with (acute) exacerbation: Secondary | ICD-10-CM | POA: Insufficient documentation

## 2020-06-08 DIAGNOSIS — I5042 Chronic combined systolic (congestive) and diastolic (congestive) heart failure: Secondary | ICD-10-CM | POA: Insufficient documentation

## 2020-06-08 DIAGNOSIS — E8770 Fluid overload, unspecified: Secondary | ICD-10-CM | POA: Diagnosis not present

## 2020-06-08 DIAGNOSIS — R110 Nausea: Secondary | ICD-10-CM | POA: Diagnosis not present

## 2020-06-08 LAB — CBG MONITORING, ED: Glucose-Capillary: 85 mg/dL (ref 70–99)

## 2020-06-08 MED ORDER — ONDANSETRON 4 MG PO TBDP
8.0000 mg | ORAL_TABLET | Freq: Once | ORAL | Status: AC
  Administered 2020-06-08: 8 mg via ORAL
  Filled 2020-06-08: qty 2

## 2020-06-08 MED ORDER — OXYCODONE HCL 5 MG PO TABS
15.0000 mg | ORAL_TABLET | Freq: Once | ORAL | Status: AC
  Administered 2020-06-08: 15 mg via ORAL
  Filled 2020-06-08: qty 3

## 2020-06-08 NOTE — ED Notes (Signed)
Upon medicating patient, patient is requesting to have a different nurse. Upon asking why patient stated "You didn't give me any warm blankets." Pt has 3 blankets on him at this time. Pt refusing to go to xray as well. Pt states "I am just here for dialysis and I don't need no xray." Provider notified.

## 2020-06-08 NOTE — Discharge Instructions (Addendum)
Go to your dialysis center for dialysis
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
## 2020-06-08 NOTE — ED Triage Notes (Signed)
Right hip and back pain. Also "here for dialysis"

## 2020-06-08 NOTE — ED Notes (Signed)
Assuming care of patient at this time. Pt ambulatory with no assistance. Pt provided with pillow and blanket. Pt is refusing to be placed on the monitor.

## 2020-06-08 NOTE — ED Notes (Signed)
Refused lab draw

## 2020-06-09 IMAGING — US IR THORACENTESIS ASP PLEURAL SPACE W/IMG GUIDE
1 series · 2 of 2 positions shown · non-contrast
Comparison: none

INDICATION: Patient with history of shortness of breath and recurrent
right-sided pleural effusion. Request is made for therapeutic right
thoracentesis.

[Series 1: ir (id) (id)/(id)/(id) ir · 2 of 2 slices shown]
[im 1/2]
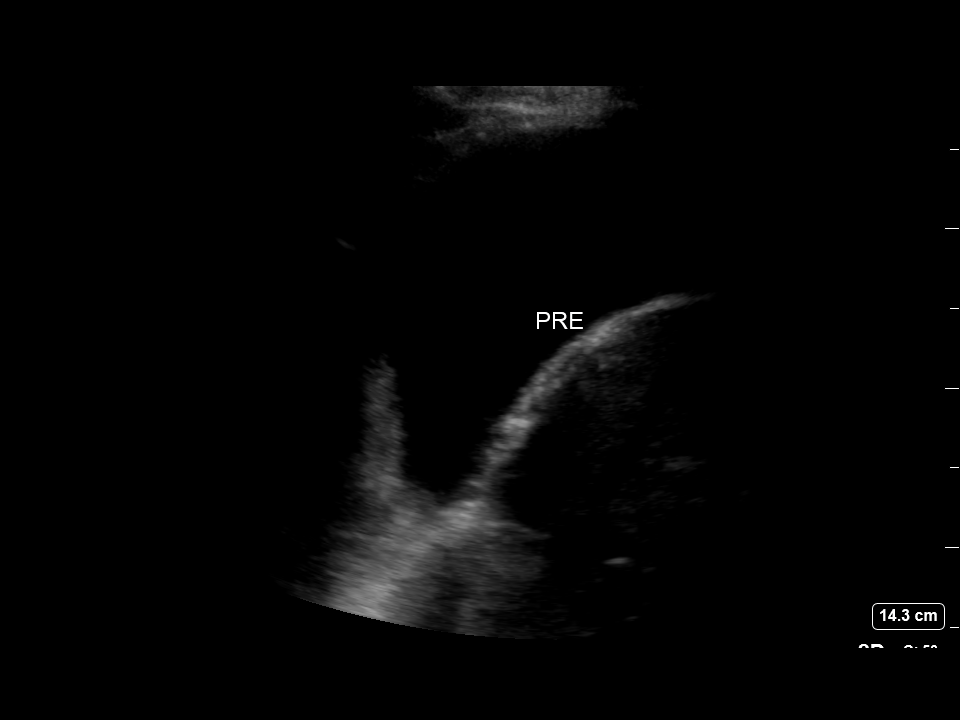
[im 2/2]
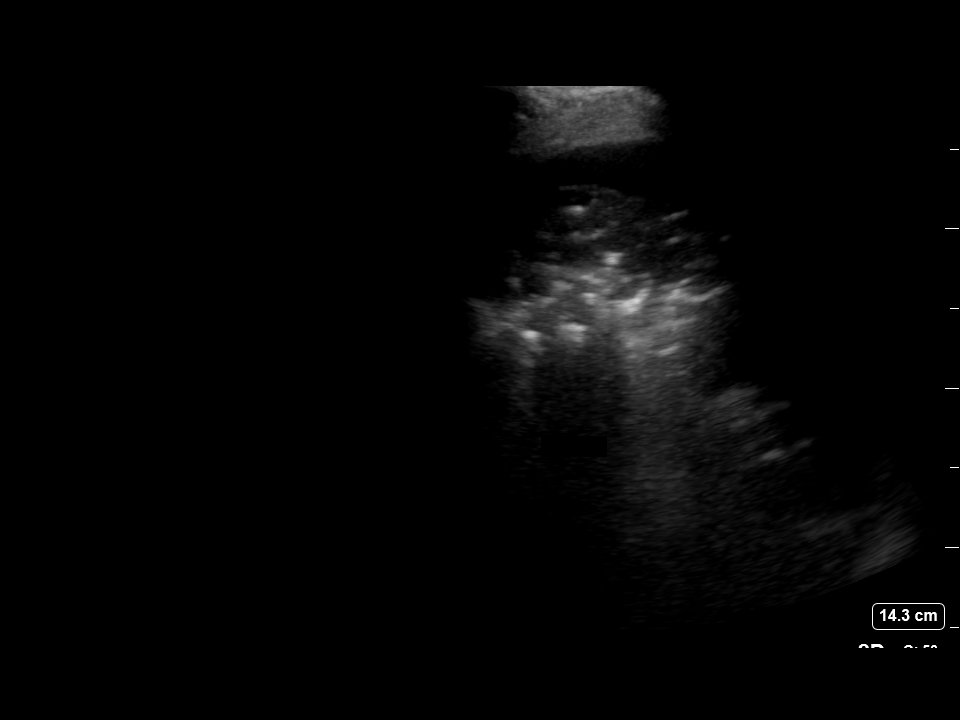

[2 of 2 positions shown; findings below may reference images not displayed]

EXAM:
ULTRASOUND GUIDED THERAPEUTIC RIGHT THORACENTESIS

MEDICATIONS:
10 mL of 2% lidocaine

COMPLICATIONS:
None immediate.

PROCEDURE:
An ultrasound guided thoracentesis was thoroughly discussed with the
patient and questions answered. The benefits, risks, alternatives
and complications were also discussed. The patient understands and
wishes to proceed with the procedure. Written consent was obtained.

Ultrasound was performed to localize and mark an adequate pocket of
fluid in the right chest. The area was then prepped and draped in
the normal sterile fashion. 2% Lidocaine was used for local
anesthesia. Under ultrasound guidance a 6 Fr Safe-T-Centesis
catheter was introduced. Thoracentesis was performed. The catheter
was removed and a dressing applied.
FINDINGS: A total of approximately 1.5 L of clear gold fluid was removed.
IMPRESSION: Successful ultrasound guided right thoracentesis yielding 1.5 L of
pleural fluid.

## 2020-06-09 IMAGING — DX DG CHEST 1V
1 series · 1 of 1 positions shown · non-contrast
Comparison: 01/16/2018

CLINICAL DATA: Status post right thoracentesis

EXAM:
CHEST  1 VIEW

[x chest ap]
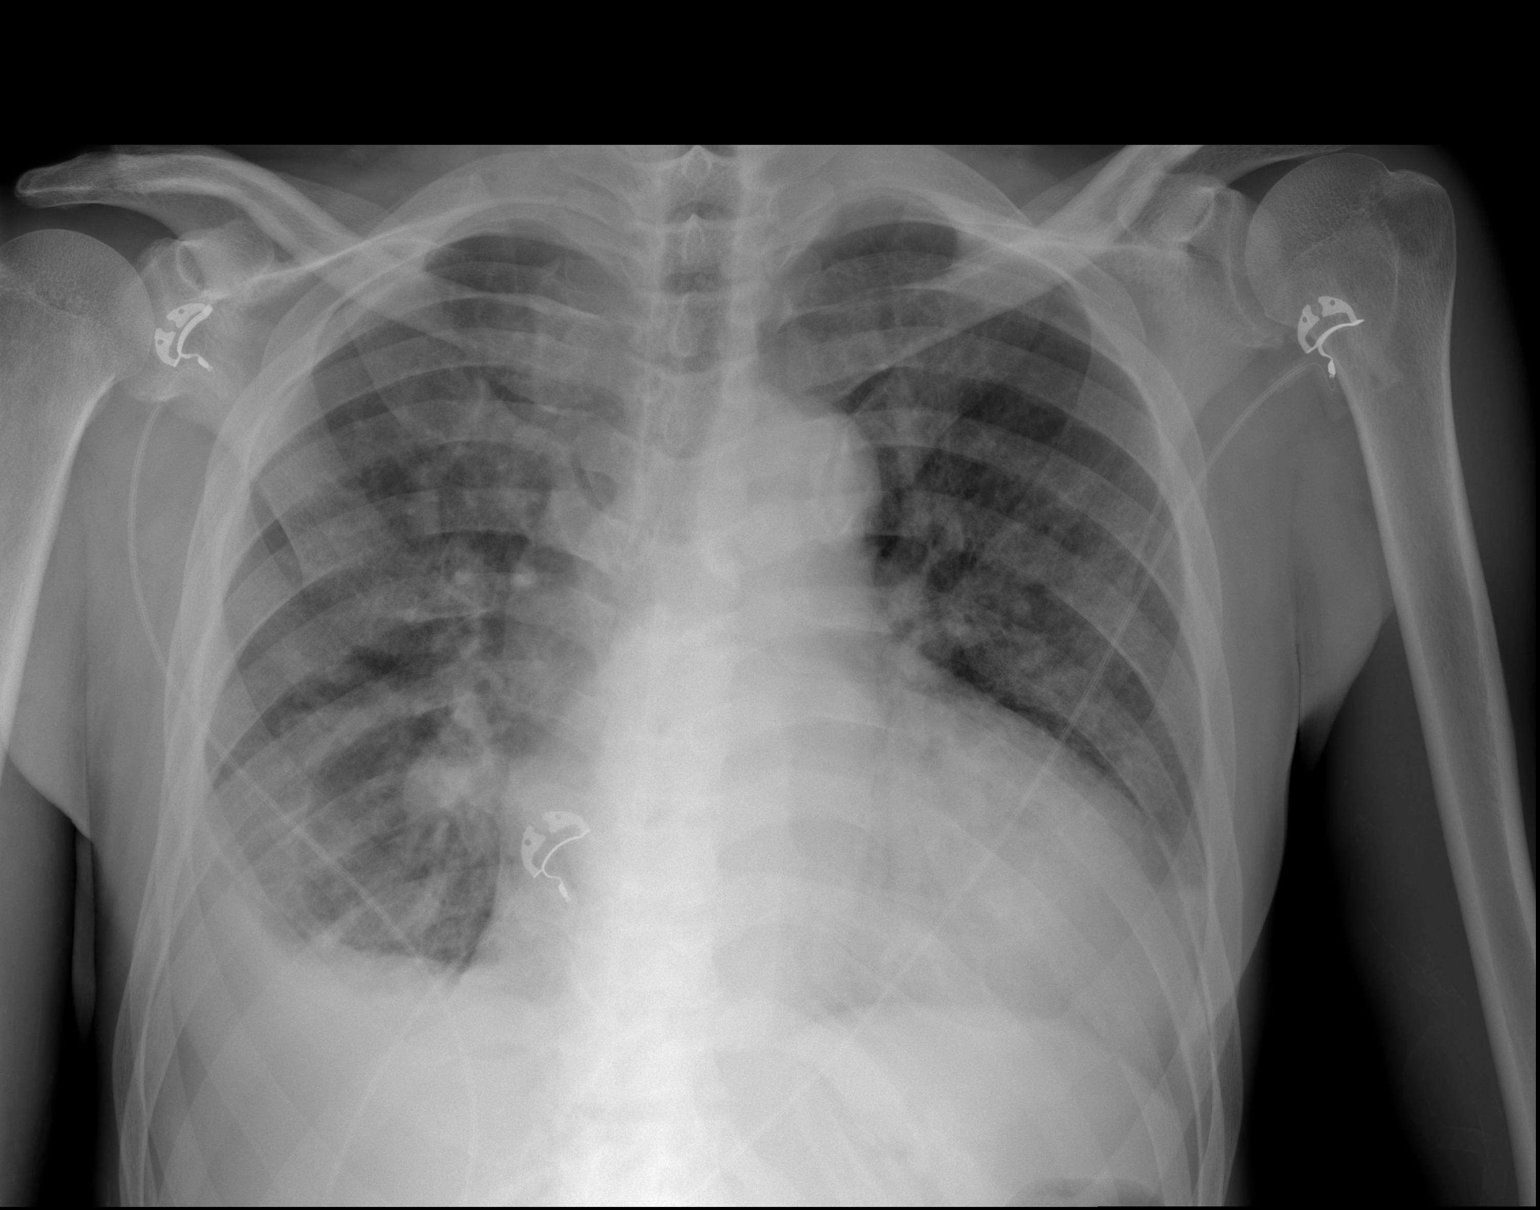

[1 of 1 positions shown; findings below may reference images not displayed]

FINDINGS: Previously seen right pleural effusion has reduced significantly
following thoracentesis. No pneumothorax is noted. Cardiac shadow
remains enlarged. No focal infiltrate is seen. Mild central vascular
congestion is noted without edema.
IMPRESSION: Status post right thoracentesis without pneumothorax.

## 2020-06-10 ENCOUNTER — Encounter (HOSPITAL_COMMUNITY): Payer: Self-pay | Admitting: *Deleted

## 2020-06-10 ENCOUNTER — Encounter (HOSPITAL_BASED_OUTPATIENT_CLINIC_OR_DEPARTMENT_OTHER): Payer: Self-pay | Admitting: Emergency Medicine

## 2020-06-10 ENCOUNTER — Inpatient Hospital Stay (HOSPITAL_COMMUNITY)
Admission: EM | Admit: 2020-06-10 | Discharge: 2020-06-13 | DRG: 640 | Disposition: A | Discharge status: Home / Self Care | Payer: Medicare Other | Attending: Internal Medicine | Admitting: Internal Medicine

## 2020-06-10 ENCOUNTER — Other Ambulatory Visit: Payer: Self-pay

## 2020-06-10 ENCOUNTER — Emergency Department (HOSPITAL_COMMUNITY): Payer: Medicare Other

## 2020-06-10 ENCOUNTER — Emergency Department (HOSPITAL_BASED_OUTPATIENT_CLINIC_OR_DEPARTMENT_OTHER)
Admission: EM | Admit: 2020-06-10 | Discharge: 2020-06-10 | Disposition: A | Discharge status: Home / Self Care | Payer: Medicare Other | Source: Home / Self Care | Attending: Emergency Medicine | Admitting: Emergency Medicine

## 2020-06-10 DIAGNOSIS — Z87891 Personal history of nicotine dependence: Secondary | ICD-10-CM | POA: Insufficient documentation

## 2020-06-10 DIAGNOSIS — Z79899 Other long term (current) drug therapy: Secondary | ICD-10-CM

## 2020-06-10 DIAGNOSIS — N186 End stage renal disease: Secondary | ICD-10-CM | POA: Insufficient documentation

## 2020-06-10 DIAGNOSIS — R9431 Abnormal electrocardiogram [ECG] [EKG]: Secondary | ICD-10-CM | POA: Diagnosis present

## 2020-06-10 DIAGNOSIS — Z86718 Personal history of other venous thrombosis and embolism: Secondary | ICD-10-CM

## 2020-06-10 DIAGNOSIS — J449 Chronic obstructive pulmonary disease, unspecified: Secondary | ICD-10-CM | POA: Diagnosis present

## 2020-06-10 DIAGNOSIS — T8612 Kidney transplant failure: Secondary | ICD-10-CM | POA: Diagnosis present

## 2020-06-10 DIAGNOSIS — Z886 Allergy status to analgesic agent status: Secondary | ICD-10-CM

## 2020-06-10 DIAGNOSIS — I132 Hypertensive heart and chronic kidney disease with heart failure and with stage 5 chronic kidney disease, or end stage renal disease: Secondary | ICD-10-CM | POA: Diagnosis present

## 2020-06-10 DIAGNOSIS — Z888 Allergy status to other drugs, medicaments and biological substances status: Secondary | ICD-10-CM

## 2020-06-10 DIAGNOSIS — N19 Unspecified kidney failure: Secondary | ICD-10-CM | POA: Diagnosis present

## 2020-06-10 DIAGNOSIS — I160 Hypertensive urgency: Secondary | ICD-10-CM | POA: Diagnosis present

## 2020-06-10 DIAGNOSIS — I5023 Acute on chronic systolic (congestive) heart failure: Secondary | ICD-10-CM | POA: Insufficient documentation

## 2020-06-10 DIAGNOSIS — E1122 Type 2 diabetes mellitus with diabetic chronic kidney disease: Secondary | ICD-10-CM | POA: Diagnosis present

## 2020-06-10 DIAGNOSIS — Z7901 Long term (current) use of anticoagulants: Secondary | ICD-10-CM | POA: Insufficient documentation

## 2020-06-10 DIAGNOSIS — M25559 Pain in unspecified hip: Secondary | ICD-10-CM | POA: Insufficient documentation

## 2020-06-10 DIAGNOSIS — R109 Unspecified abdominal pain: Secondary | ICD-10-CM | POA: Insufficient documentation

## 2020-06-10 DIAGNOSIS — M25551 Pain in right hip: Secondary | ICD-10-CM | POA: Diagnosis present

## 2020-06-10 DIAGNOSIS — G894 Chronic pain syndrome: Secondary | ICD-10-CM | POA: Diagnosis present

## 2020-06-10 DIAGNOSIS — IMO0002 Reserved for concepts with insufficient information to code with codable children: Secondary | ICD-10-CM | POA: Diagnosis present

## 2020-06-10 DIAGNOSIS — K861 Other chronic pancreatitis: Secondary | ICD-10-CM | POA: Diagnosis present

## 2020-06-10 DIAGNOSIS — Z992 Dependence on renal dialysis: Secondary | ICD-10-CM | POA: Insufficient documentation

## 2020-06-10 DIAGNOSIS — R110 Nausea: Secondary | ICD-10-CM

## 2020-06-10 DIAGNOSIS — I5042 Chronic combined systolic (congestive) and diastolic (congestive) heart failure: Secondary | ICD-10-CM | POA: Diagnosis present

## 2020-06-10 DIAGNOSIS — E8770 Fluid overload, unspecified: Principal | ICD-10-CM | POA: Diagnosis present

## 2020-06-10 DIAGNOSIS — D61818 Other pancytopenia: Secondary | ICD-10-CM | POA: Diagnosis present

## 2020-06-10 DIAGNOSIS — R112 Nausea with vomiting, unspecified: Secondary | ICD-10-CM

## 2020-06-10 DIAGNOSIS — Z885 Allergy status to narcotic agent status: Secondary | ICD-10-CM

## 2020-06-10 DIAGNOSIS — Z20822 Contact with and (suspected) exposure to covid-19: Secondary | ICD-10-CM | POA: Diagnosis present

## 2020-06-10 DIAGNOSIS — K219 Gastro-esophageal reflux disease without esophagitis: Secondary | ICD-10-CM | POA: Diagnosis present

## 2020-06-10 DIAGNOSIS — I150 Renovascular hypertension: Secondary | ICD-10-CM

## 2020-06-10 DIAGNOSIS — I10 Essential (primary) hypertension: Secondary | ICD-10-CM

## 2020-06-10 DIAGNOSIS — G8929 Other chronic pain: Secondary | ICD-10-CM

## 2020-06-10 DIAGNOSIS — E1129 Type 2 diabetes mellitus with other diabetic kidney complication: Secondary | ICD-10-CM | POA: Diagnosis present

## 2020-06-10 DIAGNOSIS — E875 Hyperkalemia: Secondary | ICD-10-CM | POA: Diagnosis present

## 2020-06-10 DIAGNOSIS — M549 Dorsalgia, unspecified: Secondary | ICD-10-CM | POA: Diagnosis present

## 2020-06-10 DIAGNOSIS — Z86711 Personal history of pulmonary embolism: Secondary | ICD-10-CM

## 2020-06-10 MED ORDER — KETOROLAC TROMETHAMINE 60 MG/2ML IM SOLN
60.0000 mg | Freq: Once | INTRAMUSCULAR | Status: AC
  Administered 2020-06-10: 60 mg via INTRAMUSCULAR
  Filled 2020-06-10: qty 2

## 2020-06-10 MED ORDER — ONDANSETRON 4 MG PO TBDP
8.0000 mg | ORAL_TABLET | Freq: Once | ORAL | Status: AC
  Administered 2020-06-10: 8 mg via ORAL
  Filled 2020-06-10: qty 2

## 2020-06-10 MED ORDER — ALUM & MAG HYDROXIDE-SIMETH 200-200-20 MG/5ML PO SUSP
30.0000 mL | Freq: Once | ORAL | Status: DC
  Filled 2020-06-10: qty 30

## 2020-06-10 MED ORDER — ONDANSETRON 8 MG PO TBDP: ORAL_TABLET | ORAL | 0 refills | Status: AC

## 2020-06-10 NOTE — ED Notes (Signed)
Staff attempted lab draw w [place] 1x, unsuccessful, will re-attempt.

## 2020-06-10 NOTE — ED Triage Notes (Signed)
The pt reports that he needs dialysis he was last dialyzed  Last Monday  Not sob  He reports that the last time they turned him down ///

## 2020-06-10 NOTE — ED Triage Notes (Signed)
Pt c/o bilateral hip and leg pain. Pt placed on oxygen 2L via [place] as per home.

## 2020-06-10 NOTE — ED Notes (Addendum)
Pt is asking to be put on oxygen pt oxygen 98  Triage nurse aware

## 2020-06-10 NOTE — ED Notes (Signed)
Unable to obtain access for lab draw. Notified MD.
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
## 2020-06-10 NOTE — ED Notes (Signed)
Pt reports calling his brother for a ride home. Pt stated ride ETA ~15 mins. Pt calm, waiting in lobby

## 2020-06-10 NOTE — ED Notes (Signed)
Pt allowing staff to assess for lab draw above right AC only. Pt c/c, staff using [place]

## 2020-06-10 NOTE — ED Notes (Signed)
Attempted to ultrasound IV insertion X 1 without success. Patient tolerated well.

## 2020-06-10 NOTE — ED Notes (Signed)
Pt refused to allow staff to reassess vitals. Pt refused dc signature. Gave pt snacks/fluids. Enc pt to contact his family member for a ride home from ED.

## 2020-06-11 DIAGNOSIS — Z7901 Long term (current) use of anticoagulants: Secondary | ICD-10-CM | POA: Diagnosis not present

## 2020-06-11 DIAGNOSIS — Z87891 Personal history of nicotine dependence: Secondary | ICD-10-CM | POA: Diagnosis not present

## 2020-06-11 DIAGNOSIS — T8612 Kidney transplant failure: Secondary | ICD-10-CM | POA: Diagnosis present

## 2020-06-11 DIAGNOSIS — K861 Other chronic pancreatitis: Secondary | ICD-10-CM | POA: Diagnosis present

## 2020-06-11 DIAGNOSIS — J449 Chronic obstructive pulmonary disease, unspecified: Secondary | ICD-10-CM | POA: Diagnosis present

## 2020-06-11 DIAGNOSIS — I5042 Chronic combined systolic (congestive) and diastolic (congestive) heart failure: Secondary | ICD-10-CM | POA: Diagnosis present

## 2020-06-11 DIAGNOSIS — E8779 Other fluid overload: Secondary | ICD-10-CM | POA: Diagnosis not present

## 2020-06-11 DIAGNOSIS — M549 Dorsalgia, unspecified: Secondary | ICD-10-CM | POA: Diagnosis present

## 2020-06-11 DIAGNOSIS — E1165 Type 2 diabetes mellitus with hyperglycemia: Secondary | ICD-10-CM

## 2020-06-11 DIAGNOSIS — N186 End stage renal disease: Secondary | ICD-10-CM

## 2020-06-11 DIAGNOSIS — Z888 Allergy status to other drugs, medicaments and biological substances status: Secondary | ICD-10-CM | POA: Diagnosis not present

## 2020-06-11 DIAGNOSIS — E875 Hyperkalemia: Secondary | ICD-10-CM | POA: Diagnosis present

## 2020-06-11 DIAGNOSIS — I132 Hypertensive heart and chronic kidney disease with heart failure and with stage 5 chronic kidney disease, or end stage renal disease: Secondary | ICD-10-CM | POA: Diagnosis present

## 2020-06-11 DIAGNOSIS — I160 Hypertensive urgency: Secondary | ICD-10-CM | POA: Diagnosis present

## 2020-06-11 DIAGNOSIS — E1122 Type 2 diabetes mellitus with diabetic chronic kidney disease: Secondary | ICD-10-CM | POA: Diagnosis present

## 2020-06-11 DIAGNOSIS — E8770 Fluid overload, unspecified: Secondary | ICD-10-CM | POA: Diagnosis present

## 2020-06-11 DIAGNOSIS — R9431 Abnormal electrocardiogram [ECG] [EKG]: Secondary | ICD-10-CM

## 2020-06-11 DIAGNOSIS — G894 Chronic pain syndrome: Secondary | ICD-10-CM | POA: Diagnosis present

## 2020-06-11 DIAGNOSIS — Z885 Allergy status to narcotic agent status: Secondary | ICD-10-CM | POA: Diagnosis not present

## 2020-06-11 DIAGNOSIS — E1129 Type 2 diabetes mellitus with other diabetic kidney complication: Secondary | ICD-10-CM

## 2020-06-11 DIAGNOSIS — D61818 Other pancytopenia: Secondary | ICD-10-CM

## 2020-06-11 DIAGNOSIS — Z886 Allergy status to analgesic agent status: Secondary | ICD-10-CM | POA: Diagnosis not present

## 2020-06-11 DIAGNOSIS — K219 Gastro-esophageal reflux disease without esophagitis: Secondary | ICD-10-CM | POA: Diagnosis present

## 2020-06-11 DIAGNOSIS — N19 Unspecified kidney failure: Secondary | ICD-10-CM

## 2020-06-11 DIAGNOSIS — R110 Nausea: Secondary | ICD-10-CM | POA: Diagnosis present

## 2020-06-11 DIAGNOSIS — M25551 Pain in right hip: Secondary | ICD-10-CM | POA: Diagnosis present

## 2020-06-11 DIAGNOSIS — Z20822 Contact with and (suspected) exposure to covid-19: Secondary | ICD-10-CM | POA: Diagnosis present

## 2020-06-11 DIAGNOSIS — Z79899 Other long term (current) drug therapy: Secondary | ICD-10-CM | POA: Diagnosis not present

## 2020-06-11 DIAGNOSIS — Z86711 Personal history of pulmonary embolism: Secondary | ICD-10-CM | POA: Diagnosis not present

## 2020-06-11 DIAGNOSIS — Z992 Dependence on renal dialysis: Secondary | ICD-10-CM

## 2020-06-11 LAB — COMPREHENSIVE METABOLIC PANEL
ALT: 10 U/L (ref 0–44)
AST: 16 U/L (ref 15–41)
Albumin: 3 g/dL — ABNORMAL LOW (ref 3.5–5.0)
Alkaline Phosphatase: 197 U/L — ABNORMAL HIGH (ref 38–126)
Anion gap: 16 — ABNORMAL HIGH (ref 5–15)
BUN: 84 mg/dL — ABNORMAL HIGH (ref 6–20)
CO2: 22 mmol/L (ref 22–32)
Calcium: 8 mg/dL — ABNORMAL LOW (ref 8.9–10.3)
Chloride: 102 mmol/L (ref 98–111)
Creatinine, Ser: 14.1 mg/dL — ABNORMAL HIGH (ref 0.61–1.24)
GFR, Estimated: 4 mL/min — ABNORMAL LOW (ref >60)
Glucose, Bld: 87 mg/dL (ref 70–99)
Potassium: 5.3 mmol/L — ABNORMAL HIGH (ref 3.5–5.1)
Sodium: 140 mmol/L (ref 135–145)
Total Bilirubin: 0.8 mg/dL (ref 0.3–1.2)
Total Protein: 7.8 g/dL (ref 6.5–8.1)

## 2020-06-11 LAB — CBC
HCT: 29 % — ABNORMAL LOW (ref 39.0–52.0)
Hemoglobin: 8.6 g/dL — ABNORMAL LOW (ref 13.0–17.0)
MCH: 25.7 pg — ABNORMAL LOW (ref 26.0–34.0)
MCHC: 29.7 g/dL — ABNORMAL LOW (ref 30.0–36.0)
MCV: 86.8 fL (ref 80.0–100.0)
Platelets: 115 10*3/uL — ABNORMAL LOW (ref 150–400)
RBC: 3.34 MIL/uL — ABNORMAL LOW (ref 4.22–5.81)
RDW: 18.6 % — ABNORMAL HIGH (ref 11.5–15.5)
WBC: 3.4 10*3/uL — ABNORMAL LOW (ref 4.0–10.5)
nRBC: 0 % (ref 0.0–0.2)

## 2020-06-11 LAB — GLUCOSE, CAPILLARY
Glucose-Capillary: 73 mg/dL (ref 70–99)
Glucose-Capillary: 95 mg/dL (ref 70–99)

## 2020-06-11 LAB — RESPIRATORY PANEL BY RT PCR (FLU A&B, COVID)
Influenza A by PCR: NEGATIVE
Influenza B by PCR: NEGATIVE
SARS Coronavirus 2 by RT PCR: NEGATIVE

## 2020-06-11 LAB — CBG MONITORING, ED: Glucose-Capillary: 92 mg/dL (ref 70–99)

## 2020-06-11 LAB — LIPASE, BLOOD: Lipase: 75 U/L — ABNORMAL HIGH (ref 11–51)

## 2020-06-11 MED ORDER — RENA-VITE PO TABS
1.0000 | ORAL_TABLET | Freq: Every day | ORAL | Status: DC
  Administered 2020-06-11 – 2020-06-13 (×3): 1 via ORAL
  Filled 2020-06-11 (×3): qty 1

## 2020-06-11 MED ORDER — UMECLIDINIUM BROMIDE 62.5 MCG/INH IN AEPB
1.0000 | INHALATION_SPRAY | Freq: Every day | RESPIRATORY_TRACT | Status: DC
  Administered 2020-06-13: 1 via RESPIRATORY_TRACT
  Filled 2020-06-11: qty 7

## 2020-06-11 MED ORDER — LANTHANUM CARBONATE 500 MG PO CHEW
1000.0000 mg | CHEWABLE_TABLET | Freq: Three times a day (TID) | ORAL | Status: DC
  Administered 2020-06-11 – 2020-06-13 (×3): 1000 mg via ORAL
  Filled 2020-06-11 (×4): qty 2

## 2020-06-11 MED ORDER — HYDRALAZINE HCL 50 MG PO TABS
100.0000 mg | ORAL_TABLET | Freq: Three times a day (TID) | ORAL | Status: DC
  Administered 2020-06-11 – 2020-06-13 (×5): 100 mg via ORAL
  Filled 2020-06-11 (×5): qty 2

## 2020-06-11 MED ORDER — NEPHRO VITAMINS 0.8 MG PO TABS: 1.0000 | ORAL_TABLET | Freq: Every day | ORAL | Status: DC

## 2020-06-11 MED ORDER — CYCLOBENZAPRINE HCL 10 MG PO TABS
10.0000 mg | ORAL_TABLET | Freq: Three times a day (TID) | ORAL | Status: DC
  Administered 2020-06-11 – 2020-06-13 (×4): 10 mg via ORAL
  Filled 2020-06-11 (×6): qty 1

## 2020-06-11 MED ORDER — SODIUM CHLORIDE 0.9% FLUSH: 3.0000 mL | Freq: Two times a day (BID) | INTRAVENOUS | Status: DC

## 2020-06-11 MED ORDER — HYDROMORPHONE HCL 1 MG/ML IJ SOLN
INTRAMUSCULAR | Status: AC
  Administered 2020-06-11: 1 mg via INTRAMUSCULAR
  Filled 2020-06-11: qty 1

## 2020-06-11 MED ORDER — PROMETHAZINE HCL 25 MG/ML IJ SOLN
25.0000 mg | Freq: Once | INTRAMUSCULAR | Status: AC
  Administered 2020-06-11: 25 mg via INTRAMUSCULAR
  Filled 2020-06-11: qty 1

## 2020-06-11 MED ORDER — HYDROMORPHONE HCL 1 MG/ML IJ SOLN
1.0000 mg | INTRAMUSCULAR | Status: AC | PRN
  Administered 2020-06-11: 1 mg via INTRAMUSCULAR
  Filled 2020-06-11: qty 1

## 2020-06-11 MED ORDER — TEMAZEPAM 15 MG PO CAPS
15.0000 mg | ORAL_CAPSULE | Freq: Every evening | ORAL | Status: DC | PRN
  Administered 2020-06-11 – 2020-06-12 (×2): 15 mg via ORAL
  Filled 2020-06-11 (×2): qty 1

## 2020-06-11 MED ORDER — LISINOPRIL 5 MG PO TABS
5.0000 mg | ORAL_TABLET | Freq: Every day | ORAL | Status: DC
  Administered 2020-06-11 – 2020-06-13 (×3): 5 mg via ORAL
  Filled 2020-06-11 (×3): qty 1

## 2020-06-11 MED ORDER — CARVEDILOL 25 MG PO TABS
25.0000 mg | ORAL_TABLET | Freq: Two times a day (BID) | ORAL | Status: DC
  Administered 2020-06-11 – 2020-06-13 (×4): 25 mg via ORAL
  Filled 2020-06-11 (×5): qty 1

## 2020-06-11 MED ORDER — HEPARIN SODIUM (PORCINE) 1000 UNIT/ML IJ SOLN
INTRAMUSCULAR | Status: AC
  Administered 2020-06-11: 2000 [IU] via INTRAVENOUS_CENTRAL
  Filled 2020-06-11: qty 2

## 2020-06-11 MED ORDER — APIXABAN 5 MG PO TABS
5.0000 mg | ORAL_TABLET | Freq: Two times a day (BID) | ORAL | Status: DC
  Administered 2020-06-11 – 2020-06-13 (×3): 5 mg via ORAL
  Filled 2020-06-11 (×5): qty 1

## 2020-06-11 MED ORDER — HYDROMORPHONE HCL 1 MG/ML IJ SOLN
1.0000 mg | Freq: Once | INTRAMUSCULAR | Status: AC
  Administered 2020-06-11: 1 mg via INTRAMUSCULAR
  Filled 2020-06-11: qty 1

## 2020-06-11 MED ORDER — SCOPOLAMINE 1 MG/3DAYS TD PT72
1.0000 | MEDICATED_PATCH | TRANSDERMAL | Status: DC | PRN
  Filled 2020-06-11: qty 1

## 2020-06-11 MED ORDER — HEPARIN SODIUM (PORCINE) 1000 UNIT/ML IJ SOLN
INTRAMUSCULAR | Status: AC
  Administered 2020-06-11: 4000 [IU] via INTRAVENOUS_CENTRAL
  Filled 2020-06-11: qty 4

## 2020-06-11 MED ORDER — AMLODIPINE BESYLATE 5 MG PO TABS
5.0000 mg | ORAL_TABLET | Freq: Every day | ORAL | Status: DC
  Administered 2020-06-11 – 2020-06-13 (×3): 5 mg via ORAL
  Filled 2020-06-11 (×3): qty 1

## 2020-06-11 MED ORDER — ALBUTEROL SULFATE (2.5 MG/3ML) 0.083% IN NEBU: 2.5000 mg | INHALATION_SOLUTION | Freq: Four times a day (QID) | RESPIRATORY_TRACT | Status: DC | PRN

## 2020-06-11 MED ORDER — HEPARIN SODIUM (PORCINE) 5000 UNIT/ML IJ SOLN: 5000.0000 [IU] | Freq: Three times a day (TID) | INTRAMUSCULAR | Status: DC

## 2020-06-11 MED ORDER — CHLORHEXIDINE GLUCONATE CLOTH 2 % EX PADS: 6.0000 | MEDICATED_PAD | Freq: Every day | CUTANEOUS | Status: DC

## 2020-06-11 MED ORDER — OXYCODONE HCL 5 MG PO TABS
15.0000 mg | ORAL_TABLET | ORAL | Status: DC | PRN
  Administered 2020-06-11 – 2020-06-13 (×7): 15 mg via ORAL
  Filled 2020-06-11 (×7): qty 3
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
## 2020-06-11 NOTE — Progress Notes (Signed)
NEW ADMISSION NOTE  Arrival Method: bed Mental Orientation: Alert and oriented x4  Telemetry: No Assessment: Completed Skin:Patient refused to have initial assessment of skin, RN was not able to assess any skin except the arms bilaterally. Patient also refused to answer admitting documentation questions, patient was educated and RN will continue to monitor Iv: No peripheral IV Pain: 8 Tubes: 0 Safety Measures: Safety Fall Prevention Plan has been given, discussed and signed Admission: Completed 5 [place] Orientation: Patient has been orientated to the room, unit and staff.  Family: 0  Orders have been reviewed and implemented. Will continue to monitor the patient. Call light has been placed within reach and bed alarm has been activated.  Patient is a poor historian and unable to answer admitting questions  Beatris Ship, RN

## 2020-06-11 NOTE — H&P (Signed)
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 is terminated or revoked sooner.     Influenza A by PCR NEGATIVE NEGATIVE Final  Influenza B by PCR NEGATIVE NEGATIVE Final    Comment: (NOTE) The Xpert Xpress SARS-CoV-2/FLU/RSV assay is intended as an aid in  the diagnosis of influenza from Nasopharyngeal swab specimens and  should not be used as a sole basis for treatment. Nasal washings and  aspirates are unacceptable for Xpert Xpress SARS-CoV-2/FLU/RSV  testing.  Fact Sheet for  Patients: PinkCheek.be  Fact Sheet for Healthcare Providers: GravelBags.it  This test is not yet approved or cleared by the [place] FDA and  has been authorized for detection and/or diagnosis of SARS-CoV-2 by  FDA under an Emergency Use Authorization (EUA). This EUA will remain  in effect (meaning this test can be used) for the duration of the  Covid-19 declaration under Section 564(b)(1) of the Act, 21  U.S.C. section 360bbb-3(b)(1), unless the authorization is  terminated or revoked. Performed at [facility], [place] [address] [place], [place] [address]      Radiological Exams on Admission: DG Chest Portable 1 View  Result Date: 06/10/2020 CLINICAL DATA:  Missed dialysis, short of breath EXAM: PORTABLE CHEST 1 VIEW COMPARISON:  06/07/2020, 01/22/2020, 12/13/2019 FINDINGS: Left-sided central venous catheter tips over the right atrium. Marked cardiomegaly with vascular congestion and mild edema. Small right greater than left pleural effusions, possibly loculated on the right given lateral tracking. Aortic atherosclerosis. No pneumothorax. IMPRESSION: Cardiomegaly with vascular congestion and mild edema. Small right greater than left pleural effusions. Electronically Signed   By: Donavan Foil M.D.   On: 06/10/2020 23:41    EKG: Independently reviewed.  Prolonged QT 504  Assessment/Plan Fluid overload uremia ESRD on HD: Acute on chronic.  Patient noted to have complaints of shortness of breath.  Chest x-ray showed cardiomegaly with mild edema and small pleural effusion.  Labs significant for potassium 5.3, BUN 84, and creatinine 14.4.  With patient's symptoms of nausea and vomiting suspect patient becoming uremic as last hemodialysis was 6 days ago.  Nephrology consulted and placed hemodialysis orders. -Admit to a MedSurg bed -Continuous pulse oximetry with nasal cannula oxygen to maintain O2 saturations -Transitions  of care consult. -Appreciate nephrology who plan for patient to have HD today and tomorrow, will follow-up for any further recommendations  Hip and back pain chronic pain syndrome: Acute on chronic.  Patient denies any new fall or trauma to onset of symptoms.  Normally patient reports symptoms controlled with oxycodone in outpatient setting, but unable to tolerate any p.o. due to nausea and vomiting symptoms.  -Dilaudid IV as needed until able to restart home medication of Dilaudid  Nausea and vomiting: Acute.  Patient reports having nausea and vomiting suspected related to need of hemodialysis.  -Scopolamine patch prn  Hyperkalemia: On admission initial potassium noted to be mildly elevated at 5.3.  Patient currently receiving hemodialysis currently -Continue to monitor  Hypertensive urgency: On admission initial blood pressures elevated up to 232/120.  Home blood pressure medications include amlodipine 5 mg daily, Coreg 25 mg twice daily, hydralazine 100 mg 3 times daily, and lisinopril 5 mg. -Restart home blood pressure medication  DVT/PE on chronic anticoagulation -Continue Eliquis  Prolonged QT interval: QTc initially noted to be 504 on admission. -Hold possible QT prolonging medication -Correcting any electrolyte abnormalities -Continue to monitor QT interval  Chronic abdominal pain secondary to chronic pancreatitis: On admission lipase noted to be elevated up to 75, but patient reports abdominal pain is relatively at baseline. -Continue to monitor  Pancytopenia: Acute on chronic.  On admission WBC 3.4, hemoglobin 8.6, and  platelet count 115.  WBC acutely low, but patient did not denied any reports of fever. -Continue to monitor  Diabetes mellitus type 2: On admission patient's blood sugars were noted to be at the lower limits of normal.  Last hemoglobin A1c noted to be 5.4 in 01/2020.  Patient is currently not on any diabetic medications. -Hypoglycemic protocols -CBGs every 4  hours  OSA on CPAP -CPAP nightly per RT DVT prophylaxis: Eliquis Code Status: Full Family Communication: Patient declined me updating any family members Disposition Plan: Likely discharge home once medically stable Consults called: Nephrology Admission status: Inpatient requiring more than 2 midnight stay for need of hemodialysis for fluid overload  Norval Morton MD [facility] Pager [phone]   If 7PM-7AM, please contact night-coverage [email or web address] Password [facility]  06/11/2020, 10:37 AM

## 2020-06-11 NOTE — Progress Notes (Addendum)
Patient refused to have insertion of peripheral IV and there is not peripheral IV to flush prescribed 33m normal saline medication, MD has been notified of patient's refusal, and RN will continue to monitor the patient.
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
## 2020-06-11 NOTE — ED Notes (Signed)
Kim RN currently at bedside for IV placement.

## 2020-06-11 NOTE — ED Notes (Signed)
Unable to obtain blood pressure at this time due to RN attempting to start IV.

## 2020-06-11 NOTE — Discharge Instructions (Signed)
Continue your regular medications as prescribed.

## 2020-06-11 NOTE — ED Notes (Signed)
Pt called out stating he is short of breath and would like to use oxygen. Current O2 sat of 99 to 100% on room air. Pt states "im working to much for my breathing, im on oxygen at home". Placed pt on oxygen at 2lpm via [place] (per pt req).

## 2020-06-11 NOTE — Progress Notes (Signed)
Asked to get HD for B. Simmerman.  Have put on the schedule for 1st shift today, 11/21.

## 2020-06-11 NOTE — ED Notes (Signed)
Family updated as to patient's status.

## 2020-06-11 NOTE — Procedures (Signed)
   I was present at this dialysis session, have reviewed the session itself and made  appropriate changes Kelly Splinter MD [facility] pager [phone]   06/11/2020, 12:27 PM

## 2020-06-11 NOTE — ED Notes (Signed)
Received pt at this time.

## 2020-06-12 DIAGNOSIS — E1129 Type 2 diabetes mellitus with other diabetic kidney complication: Secondary | ICD-10-CM | POA: Diagnosis not present

## 2020-06-12 DIAGNOSIS — E8779 Other fluid overload: Secondary | ICD-10-CM | POA: Diagnosis not present

## 2020-06-12 DIAGNOSIS — K861 Other chronic pancreatitis: Secondary | ICD-10-CM | POA: Diagnosis not present

## 2020-06-12 DIAGNOSIS — N186 End stage renal disease: Secondary | ICD-10-CM | POA: Diagnosis not present

## 2020-06-12 LAB — HEPATITIS B SURFACE ANTIGEN: Hepatitis B Surface Ag: NONREACTIVE

## 2020-06-12 LAB — GLUCOSE, CAPILLARY
Glucose-Capillary: 69 mg/dL — ABNORMAL LOW (ref 70–99)
Glucose-Capillary: 73 mg/dL (ref 70–99)
Glucose-Capillary: 130 mg/dL — ABNORMAL HIGH (ref 70–99)

## 2020-06-12 MED ORDER — HEPARIN SODIUM (PORCINE) 1000 UNIT/ML IJ SOLN
INTRAMUSCULAR | Status: AC
  Filled 2020-06-12: qty 6

## 2020-06-12 NOTE — Progress Notes (Signed)
Patient refused MN blood sugar.

## 2020-06-12 NOTE — Progress Notes (Signed)
Patient refused vital checks and CBG check upon arriving back from hemodialysis, patient was educated and RN will continue to monitor this patient.

## 2020-06-12 NOTE — Progress Notes (Signed)
PROGRESS NOTE    Frank [name]  QXI:[id number] DOB: November 14, 1965 DOA: 06/10/2020 PCP: [facility]    Brief Narrative:  Frank [name] is a 54 y.o. male with medical history significant of ESRD on HD, hypertension, chronic abdominal pain secondary to chronic pancreatitis, combined systolic and diastolic heart failure, DVT/PE on Eliquis, diabetes mellitus type 2, pericardial effusion, and COPD who presented initially for back and hip pain over the last 2 days.He has not been able to take any medications because he has been throwing up and vomiting.Patient notes associated symptoms of cough, shortness of breath, leg swelling, and generalized abdominal pain.Upon admission into the emergency department patient was seen to be afebrile with blood pressures elevated up to 232/120. Last HD 6 days ago.  11/22- Pt went for HD immediately yesterday, today in HD again. Has no outpatient HD , as he was kicked out.    Consultants:   Nephrology  Procedures: Hemodialysis  Antimicrobials:       Subjective: Patient seen in dialysis.  He has no complaints.  Reports shortness of breath improving.  Nausea and vomiting are subsiding.  Objective: Vitals:   06/11/20 1720 06/11/20 2134 06/12/20 0528 06/12/20 0821  BP: (!) 159/66 128/64 125/60   Pulse: 78 72 70   Resp: _0 Temp: 98.3 F (36.8 C) 98.4 F (36.9 C) 98.6 F (37 C)   TempSrc: Oral     SpO2: 98% 95% 98% 98%  Weight:  83.9 kg    Height:        Intake/Output Summary (Last 24 hours) at 06/12/2020 0841 Last data filed at 06/12/2020 0600 Gross per 24 hour  Intake 440 ml  Output 4000 ml  Net -3560 ml   Filed Weights   06/11/20 0111 06/11/20 1326 06/11/20 2134  Weight: 83.9 kg 83.9 kg 83.9 kg    Examination:  General exam: Appears calm and comfortable  Respiratory system: Bibasilar crackles, no wheezing Cardiovascular system: S1 & S2 heard, RRR. No JVD, murmurs, rubs, gallops or clicks.  Gastrointestinal system:  Abdomen is nondistended, soft and nontender.  Normal bowel sounds heard. Central nervous system: Alert and oriented.  Grossly intact Extremities: Positive edema Skin: Warm dry Psychiatry:  Mood & affect appropriate in current setting.     Data Reviewed: I have personally reviewed following labs and imaging studies  CBC: Recent Labs  Lab 06/06/20 0819 06/11/20 0141  WBC 4.4 3.4*  HGB 8.0* 8.6*  HCT 26.1* 29.0*  MCV 85.3 86.8  PLT 95* 034*   Basic Metabolic Panel: Recent Labs  Lab 06/06/20 0819 06/11/20 0141  NA 138 140  K 4.8 5.3*  CL 100 102  CO2 25 22  GLUCOSE 73 87  BUN 61* 84*  CREATININE 11.72* 14.10*  CALCIUM 8.5* 8.0*  PHOS 6.1*  --    GFR: Estimated Creatinine Clearance: 6.8 mL/min (A) (by C-G formula based on SCr of 14.1 mg/dL (H)). Liver Function Tests: Recent Labs  Lab 06/06/20 0819 06/11/20 0141  AST  --  16  ALT  --  10  ALKPHOS  --  197*  BILITOT  --  0.8  PROT  --  7.8  ALBUMIN 3.0* 3.0*   Recent Labs  Lab 06/11/20 0141  LIPASE 75*   No results for input(s): AMMONIA in the last 168 hours. Coagulation Profile: No results for input(s): INR, PROTIME in the last 168 hours. Cardiac Enzymes: No results for input(s): CKTOTAL, CKMB, CKMBINDEX, TROPONINI in the last 168 hours.  BNP (last 3 results) No results for input(s): PROBNP in the last 8760 hours. HbA1C: No results for input(s): HGBA1C in the last 72 hours. CBG: Recent Labs  Lab 06/07/20 0016 06/08/20 0322 06/11/20 0107 06/11/20 1112 06/11/20 2134  GLUCAP 94 85 92 73 95   Lipid Profile: No results for input(s): CHOL, HDL, LDLCALC, TRIG, CHOLHDL, LDLDIRECT in the last 72 hours. Thyroid Function Tests: No results for input(s): TSH, T4TOTAL, FREET4, T3FREE, THYROIDAB in the last 72 hours. Anemia Panel: No results for input(s): VITAMINB12, FOLATE, FERRITIN, TIBC, IRON, RETICCTPCT in the last 72 hours. Sepsis Labs: No results for input(s): PROCALCITON, LATICACIDVEN in the last 168  hours.  Recent Results (from the past 240 hour(s))  Respiratory Panel by RT PCR (Flu A&B, Covid) - Nasopharyngeal Swab     Status: None   Collection Time: 06/03/20  5:36 AM   Specimen: Nasopharyngeal Swab  Result Value Ref Range Status   SARS Coronavirus 2 by RT PCR NEGATIVE NEGATIVE Final    Comment: (NOTE) SARS-CoV-2 target nucleic acids are NOT DETECTED.  The SARS-CoV-2 RNA is generally detectable in upper respiratoy specimens during the acute phase of infection. The lowest concentration of SARS-CoV-2 viral copies this assay can detect is 131 copies/mL. A negative result does not preclude SARS-Cov-2 infection and should not be used as the sole basis for treatment or other patient management decisions. A negative result may occur with  improper specimen collection/handling, submission of specimen other than nasopharyngeal swab, presence of viral mutation(s) within the areas targeted by this assay, and inadequate number of viral copies (<131 copies/mL). A negative result must be combined with clinical observations, patient history, and epidemiological information. The expected result is Negative.  Fact Sheet for Patients:  PinkCheek.be  Fact Sheet for Healthcare Providers:  GravelBags.it  This test is no t yet approved or cleared by the [place] FDA and  has been authorized for detection and/or diagnosis of SARS-CoV-2 by FDA under an Emergency Use Authorization (EUA). This EUA will remain  in effect (meaning this test can be used) for the duration of the COVID-19 declaration under Section 564(b)(1) of the Act, 21 U.S.C. section 360bbb-3(b)(1), unless the authorization is terminated or revoked sooner.     Influenza A by PCR NEGATIVE NEGATIVE Final   Influenza B by PCR NEGATIVE NEGATIVE Final    Comment: (NOTE) The Xpert Xpress SARS-CoV-2/FLU/RSV assay is intended as an aid in  the diagnosis of influenza from  Nasopharyngeal swab specimens and  should not be used as a sole basis for treatment. Nasal washings and  aspirates are unacceptable for Xpert Xpress SARS-CoV-2/FLU/RSV  testing.  Fact Sheet for Patients: PinkCheek.be  Fact Sheet for Healthcare Providers: GravelBags.it  This test is not yet approved or cleared by the [place] FDA and  has been authorized for detection and/or diagnosis of SARS-CoV-2 by  FDA under an Emergency Use Authorization (EUA). This EUA will remain  in effect (meaning this test can be used) for the duration of the  Covid-19 declaration under Section 564(b)(1) of the Act, 21  U.S.C. section 360bbb-3(b)(1), unless the authorization is  terminated or revoked. Performed at [facility], [place] [address] [place], [place] [address]   Respiratory Panel by RT PCR (Flu A&B, Covid) - Nasopharyngeal Swab     Status: None   Collection Time: 06/07/20  2:44 AM   Specimen: Nasopharyngeal Swab  Result Value Ref Range Status   SARS Coronavirus 2 by RT PCR NEGATIVE NEGATIVE Final  Comment: (NOTE) SARS-CoV-2 target nucleic acids are NOT DETECTED.  The SARS-CoV-2 RNA is generally detectable in upper respiratoy specimens during the acute phase of infection. The lowest concentration of SARS-CoV-2 viral copies this assay can detect is 131 copies/mL. A negative result does not preclude SARS-Cov-2 infection and should not be used as the sole basis for treatment or other patient management decisions. A negative result may occur with  improper specimen collection/handling, submission of specimen other than nasopharyngeal swab, presence of viral mutation(s) within the areas targeted by this assay, and inadequate number of viral copies (<131 copies/mL). A negative result must be combined with clinical observations, patient history, and epidemiological information. The expected result is Negative.  Fact Sheet for  Patients:  PinkCheek.be  Fact Sheet for Healthcare Providers:  GravelBags.it  This test is no t yet approved or cleared by the [place] FDA and  has been authorized for detection and/or diagnosis of SARS-CoV-2 by FDA under an Emergency Use Authorization (EUA). This EUA will remain  in effect (meaning this test can be used) for the duration of the COVID-19 declaration under Section 564(b)(1) of the Act, 21 U.S.C. section 360bbb-3(b)(1), unless the authorization is terminated or revoked sooner.     Influenza A by PCR NEGATIVE NEGATIVE Final   Influenza B by PCR NEGATIVE NEGATIVE Final    Comment: (NOTE) The Xpert Xpress SARS-CoV-2/FLU/RSV assay is intended as an aid in  the diagnosis of influenza from Nasopharyngeal swab specimens and  should not be used as a sole basis for treatment. Nasal washings and  aspirates are unacceptable for Xpert Xpress SARS-CoV-2/FLU/RSV  testing.  Fact Sheet for Patients: PinkCheek.be  Fact Sheet for Healthcare Providers: GravelBags.it  This test is not yet approved or cleared by the [place] FDA and  has been authorized for detection and/or diagnosis of SARS-CoV-2 by  FDA under an Emergency Use Authorization (EUA). This EUA will remain  in effect (meaning this test can be used) for the duration of the  Covid-19 declaration under Section 564(b)(1) of the Act, 21  U.S.C. section 360bbb-3(b)(1), unless the authorization is  terminated or revoked. Performed at [facility], [place] [address], [place], [place] [address]   Respiratory Panel by RT PCR (Flu A&B, Covid) - Nasopharyngeal Swab     Status: None   Collection Time: 06/11/20  2:27 AM   Specimen: Nasopharyngeal Swab; Nasopharyngeal(NP) swabs in vial transport medium  Result Value Ref Range Status   SARS Coronavirus 2 by RT PCR NEGATIVE NEGATIVE Final     Comment: (NOTE) SARS-CoV-2 target nucleic acids are NOT DETECTED.  The SARS-CoV-2 RNA is generally detectable in upper respiratoy specimens during the acute phase of infection. The lowest concentration of SARS-CoV-2 viral copies this assay can detect is 131 copies/mL. A negative result does not preclude SARS-Cov-2 infection and should not be used as the sole basis for treatment or other patient management decisions. A negative result may occur with  improper specimen collection/handling, submission of specimen other than nasopharyngeal swab, presence of viral mutation(s) within the areas targeted by this assay, and inadequate number of viral copies (<131 copies/mL). A negative result must be combined with clinical observations, patient history, and epidemiological information. The expected result is Negative.  Fact Sheet for Patients:  PinkCheek.be  Fact Sheet for Healthcare Providers:  GravelBags.it  This test is no t yet approved or cleared by the [place] FDA and  has been authorized for detection and/or diagnosis of SARS-CoV-2 by FDA under an  Emergency Use Authorization (EUA). This EUA will remain  in effect (meaning this test can be used) for the duration of the COVID-19 declaration under Section 564(b)(1) of the Act, 21 U.S.C. section 360bbb-3(b)(1), unless the authorization is terminated or revoked sooner.     Influenza A by PCR NEGATIVE NEGATIVE Final   Influenza B by PCR NEGATIVE NEGATIVE Final    Comment: (NOTE) The Xpert Xpress SARS-CoV-2/FLU/RSV assay is intended as an aid in  the diagnosis of influenza from Nasopharyngeal swab specimens and  should not be used as a sole basis for treatment. Nasal washings and  aspirates are unacceptable for Xpert Xpress SARS-CoV-2/FLU/RSV  testing.  Fact Sheet for Patients: PinkCheek.be  Fact Sheet for Healthcare  Providers: GravelBags.it  This test is not yet approved or cleared by the [place] FDA and  has been authorized for detection and/or diagnosis of SARS-CoV-2 by  FDA under an Emergency Use Authorization (EUA). This EUA will remain  in effect (meaning this test can be used) for the duration of the  Covid-19 declaration under Section 564(b)(1) of the Act, 21  U.S.C. section 360bbb-3(b)(1), unless the authorization is  terminated or revoked. Performed at [facility], [place] [address] [place], [place] [address]          Radiology Studies: DG Chest Portable 1 View  Result Date: 06/10/2020 CLINICAL DATA:  Missed dialysis, short of breath EXAM: PORTABLE CHEST 1 VIEW COMPARISON:  06/07/2020, 01/22/2020, 12/13/2019 FINDINGS: Left-sided central venous catheter tips over the right atrium. Marked cardiomegaly with vascular congestion and mild edema. Small right greater than left pleural effusions, possibly loculated on the right given lateral tracking. Aortic atherosclerosis. No pneumothorax. IMPRESSION: Cardiomegaly with vascular congestion and mild edema. Small right greater than left pleural effusions. Electronically Signed   By: Donavan Foil M.D.   On: 06/10/2020 23:41        Scheduled Meds:  amLODipine  5 mg Oral Daily   apixaban  5 mg Oral BID   carvedilol  25 mg Oral BID   Chlorhexidine Gluconate Cloth  6 each Topical Q0600   cyclobenzaprine  10 mg Oral TID   hydrALAZINE  100 mg Oral TID   lanthanum  1,000 mg Oral TID WC   lisinopril  5 mg Oral Daily   multivitamin  1 tablet Oral Daily   sodium chloride flush  3 mL Intravenous Q12H   umeclidinium bromide  1 puff Inhalation Daily   Continuous Infusions:  Assessment & Plan:   Principal Problem:   Volume overload Active Problems:   Prolonged QT interval   DM (diabetes mellitus), type 2, uncontrolled, with renal complications (HCC)   Pancytopenia (HCC)   End-stage renal  disease on hemodialysis (HCC)   Chronic pancreatitis (HCC)   Uremia   Fluid overload  uremia  ESRD on HD: Acute on chronic.   Due to missed hemodialysis Had hemodialysis yesterday and today Nephrology following Will need outpatient HD set up prior to discharge, as he is without HD unit at this time due to behavioral issues Clinically improving   Hip and back pain  chronic pain syndrome: Acute on chronic Apparently symptoms were not controlled with oxycodone in outpatient setting.  Was started on Dilaudid IV.  Will wean off Dilaudid  Nausea and vomiting: Acute Suspect related to need of hemodialysis Improving Scopolamine patch as needed   Hyperkalemia: On admission initial potassium noted to be mildly elevated at 5.3.   Received dialysis x2 so far  We will check  a.m. levels   Hypertensive urgency: On admission initial blood pressures elevated up to 232/120. . improving with home blood pressure medications and hemodialysis Continue with current BP meds   DVT/PE -on chronic anticoagulation Continue Eliquis   Prolonged QT interval: QTc initially noted to be 504 on admission. Hold any QT prolongation medication Monitor and correct electrolytes Monitor QT interval  Chronic abdominal pain secondary to chronic pancreatitis: On admission lipase noted to be elevated up to 75, but patient reports abdominal pain is relatively at baseline. No further complaints   Pancytopenia: Chronic Continue to monitor periodically  Diabetes mellitus type 2: On admission patient's blood sugars were noted to be at the lower limits of normal.  Last hemoglobin A1c noted to be 5.4 in 01/2020.   Continue R-ISS     DVT prophylaxis: Eliquis Code Status: Full Family Communication: None at bedside  Status is: Inpatient  Remains inpatient appropriate because:Inpatient level of care appropriate due to severity of illness   Dispo: The patient is from: Home              Anticipated d/c is to:  Home              Anticipated d/c date is: 2 days              Patient currently is not medically stable to d/c.  Will need hemodialysis outpatient center to be set up prior to discharge            LOS: 1 day   Time spent: 35 minutes with more than 50% on [place], MD [facility] Pager [phone]  If 7PM-7AM, please contact night-coverage [email or web address] Password [facility] 06/12/2020, 8:41 AM

## 2020-06-12 NOTE — Progress Notes (Signed)
PT Cancellation Note  Patient Details Name: Frank [name] MRN: [id number] DOB: 22-Sep-1965   Cancelled Treatment:    Reason Eval/Treat Not Completed: Patient at procedure or test/unavailable. Pt currently in HD. Will check back as schedule allows to initiate PT evaluation.    Thelma Comp 06/12/2020, 8:42 AM   Rolinda Roan, PT, DPT Acute Rehabilitation Services Pager: [phone] Office: [phone]

## 2020-06-12 NOTE — Progress Notes (Signed)
Lab notified RN that patient refused lab draws for  prescribedCBC labs, patient was educated. MD has been notified and there are no new orders at this time, RN will continue to monitor this patient.

## 2020-06-12 NOTE — Progress Notes (Signed)
Patient refused vital signs and CBG check for routine evening vital signs, MD was notified there are no new orders at this time and RN will continue to monitor this patient.

## 2020-06-12 NOTE — Progress Notes (Signed)
Pt has refused cpap at this time.  RT will continue to monitor.

## 2020-06-12 NOTE — Plan of Care (Signed)
  Problem: Clinical Measurements: Goal: Will remain free from infection Outcome: Completed/Met Goal: Diagnostic test results will improve Outcome: Completed/Met Goal: Respiratory complications will improve Outcome: Completed/Met Goal: Cardiovascular complication will be avoided Outcome: Completed/Met   Problem: Activity: Goal: Risk for activity intolerance will decrease Outcome: Completed/Met   Problem: Nutrition: Goal: Adequate nutrition will be maintained Outcome: Completed/Met   Problem: Elimination: Goal: Will not experience complications related to bowel motility Outcome: Completed/Met Goal: Will not experience complications related to urinary retention Outcome: Completed/Met

## 2020-06-12 NOTE — Progress Notes (Signed)
  [facility] Progress Note   Assessment/ Plan:   OP HD: last OP orders Aug 2021 > 4h  L IJ TDC  2/2 bath  No heparin    CXR 11/20- vasc congestion w/ pulm edema  Assessment/ Plan: 1. SOB / pulm edema / vol overload - HD yesterday and today.  If can be weaned from O2 can d/c from renal perspective after.  2. ESRD - on HD but w/o an outpatient HD unit at this time due to behavior issues. Was dc'd from OP HD by [facility] in October.  3. Hx DVT/ PE - on eliquis 4. HTN - cont meds as tolerated 5. Chronic pain - cont meds 6. MBD ckd - cont binder 7. N/V - should improve w/ HD  Subjective:    Sleeping on dialysis, no complaints.  For 4L UF goal today   Objective:   BP (!) 146/63   Pulse 70   Temp (P) 97.8 F (36.6 C) (Oral)   Resp (!) 21   Ht _0  (1.88 m)   Wt 83.9 kg   SpO2 98%   BMI 23.75 kg/m   Physical Exam: Gen: NAD, sleeping CVS: RRR Resp: breathing unlabored ACCESS: [facility]  Labs: BMET Recent Labs  Lab 06/06/20 0819 06/11/20 0141  NA 138 140  K 4.8 5.3*  CL 100 102  CO2 25 22  GLUCOSE 73 87  BUN 61* 84*  CREATININE 11.72* 14.10*  CALCIUM 8.5* 8.0*  PHOS 6.1*  --    CBC Recent Labs  Lab 06/06/20 0819 06/11/20 0141  WBC 4.4 3.4*  HGB 8.0* 8.6*  HCT 26.1* 29.0*  MCV 85.3 86.8  PLT 95* 115*      Medications:    . amLODipine  5 mg Oral Daily  . apixaban  5 mg Oral BID  . carvedilol  25 mg Oral BID  . Chlorhexidine Gluconate Cloth  6 each Topical Q0600  . cyclobenzaprine  10 mg Oral TID  . hydrALAZINE  100 mg Oral TID  . lanthanum  1,000 mg Oral TID WC  . lisinopril  5 mg Oral Daily  . multivitamin  1 tablet Oral Daily  . sodium chloride flush  3 mL Intravenous Q12H  . umeclidinium bromide  1 puff Inhalation Daily     Madelon Lips, MD 06/12/2020, 10:23 AM

## 2020-06-13 DIAGNOSIS — E1129 Type 2 diabetes mellitus with other diabetic kidney complication: Secondary | ICD-10-CM | POA: Diagnosis not present

## 2020-06-13 DIAGNOSIS — N186 End stage renal disease: Secondary | ICD-10-CM | POA: Diagnosis not present

## 2020-06-13 DIAGNOSIS — K861 Other chronic pancreatitis: Secondary | ICD-10-CM | POA: Diagnosis not present

## 2020-06-13 DIAGNOSIS — E8779 Other fluid overload: Secondary | ICD-10-CM | POA: Diagnosis not present

## 2020-06-13 LAB — GLUCOSE, CAPILLARY: Glucose-Capillary: 96 mg/dL (ref 70–99)

## 2020-06-13 MED ORDER — AMLODIPINE BESYLATE 5 MG PO TABS: 5.0000 mg | ORAL_TABLET | Freq: Every day | ORAL | 0 refills | Status: AC

## 2020-06-13 MED ORDER — CARVEDILOL 25 MG PO TABS: 25.0000 mg | ORAL_TABLET | Freq: Two times a day (BID) | ORAL | 0 refills | Status: AC

## 2020-06-13 MED ORDER — HYDRALAZINE HCL 100 MG PO TABS: 100.0000 mg | ORAL_TABLET | Freq: Three times a day (TID) | ORAL | 0 refills | Status: AC

## 2020-06-13 MED ORDER — CHLORHEXIDINE GLUCONATE CLOTH 2 % EX PADS: 6.0000 | MEDICATED_PAD | Freq: Every day | CUTANEOUS | Status: DC

## 2020-06-13 NOTE — Progress Notes (Signed)
Lab notified this RN that patient is refusing 0800 lab draw.  MD notified, no new orders at this time. Will continue to monitor.

## 2020-06-13 NOTE — Progress Notes (Signed)
DISCHARGE NOTE HOME KAMIN NIBLACK to be discharged Home per MD order. Discussed prescriptions and follow up appointments with the patient. Prescriptions given to patient; medication list explained in detail. Patient verbalized understanding.  Skin clean, dry and intact without evidence of skin break down, no evidence of skin tears noted. No IV catheter in place. No complaints noted.  Patient free of lines, drains, and wounds.   An After Visit Summary (AVS) was printed and given to the patient. Patient escorted via wheelchair, and discharged home via private auto.  Vira Agar, RN

## 2020-06-13 NOTE — Discharge Summary (Signed)
Frank [name] IPJ:[id number] DOB: [birth year] DOA: 06/10/2020  PCP: [facility]  Admit date: 06/10/2020 Discharge date: 06/13/2020  Admitted From: Home Disposition: Home  Recommendations for Outpatient Follow-up:  1. Follow up with PCP in 1 week 2. Please obtain BMP/CBC in one week 3. Return to the ER for hemodialysis as he has no plans for dialysis due to behavioral issues     Discharge Condition:Stable CODE STATUS: Full Diet recommendation: Renal diet    Brief/Interim Summary: Per HPI , Frank [name] a 54 y.o.malewith medical history significant ofESRD on HD, hypertension, chronic abdominal pain secondary to chronic pancreatitis, combined systolic and diastolic heart failure, DVT/PE on Eliquis, diabetes mellitus type 2, pericardial effusion, and COPD who presentedinitially for back and hip pain over the last 2 days.He has not been able to take any medications because he has been throwing up and vomiting.Patient notes associated symptoms of cough, shortness of breath, leg swelling, and generalized abdominal pain.Upon admission into the emergency department patient was seen to be afebrile with blood pressures elevated up to 232/120. Last HD 6 days ago.  Covid influenza screen was negative.Chest x-ray revealed cardiomegaly with vascular congestion, mild edema, and small pleural effusions with the right being greater than the left.  Nephrology has been consulted and took the patient immediately for hemodialysis on the day of admission.  He was admitted to the hospitalist service.  Please note during his hospitalization he refused glucose checks and blood work.  Fluid overloaduremiaESRD on HD: Acute on chronic.  Due to missed hemodialysis And serial hemodialysis on 11/21 at 11/22. Clinically and symptomatically he resumed 10 days back on room air Nephrology cleared patient for discharge On HD but w/o an outpatient HD unit at this time due to behavior issues. Was dc'd  from OP HD by [facility] in October.    Nephrology discussed with patient about good compliance here will make outpatient dialysis units more likely to accept him (needs to stop refusing blood draws, etc).    Hipand back painchronic pain syndrome: Acute on chronic Apparently symptoms were not controlled with oxycodone in outpatient setting.  Was started on Dilaudid as needed weaned off Continue home oxycodone as outpatient  Nausea and vomiting: Acute Suspect related to need of hemodialysis Improved  Hyperkalemia: On admission initial potassium noted to be mildly elevated at 5.3. Received dialysis x2  Refused subsequent blood work  Hypertensive urgency: On admission initial blood pressures elevated up to 232/120. . Improved with blood pressure medications and hemodialysis Continue home blood pressure medications on discharge Ok to continue ACEI per nephrology PA, Mrs. [place].   DVT/PE -on chronic anticoagulation Continue Eliquis   Prolonged QT interval: QTc initially noted to be 504on admission. Held any QT prolongation medication   Chronic abdominal pain secondary to chronic pancreatitis: On admission lipase noted to be elevated up to 75, but patient reports abdominal pain is relatively at baseline. No further complaints   Pancytopenia: Chronic   Diabetes mellitus type 2:On admission patient's blood sugars were noted to be at the lower limits of normal. Last hemoglobin A1c noted to be 5.4 in 01/2020. Continue home meds    Discharge Diagnoses:  Principal Problem:   Volume overload Active Problems:   Prolonged QT interval   DM (diabetes mellitus), type 2, uncontrolled, with renal complications (HCC)   Pancytopenia (HCC)   End-stage renal disease on hemodialysis ([place])   Chronic pancreatitis [facility])   Uremia    Discharge Instructions  Discharge Instructions    Call  MD for:  difficulty breathing, headache or visual disturbances   Complete by: As  directed    Diet - low sodium heart healthy   Complete by: As directed    Renal diet   Increase activity slowly   Complete by: As directed    No wound care   Complete by: As directed      Allergies as of 06/13/2020      Reactions   Butalbital Anaphylaxis, Other (See Comments)   Butalbital-apap-caffeine Shortness Of Breath, Swelling, Other (See Comments)   Swelling in throat   Minoxidil Shortness Of Breath, Other (See Comments)   Na Ferric Gluc Cplx In Sucrose Shortness Of Breath, Swelling, Other (See Comments)   Swelling in throat, tolerates Venofor   Tylenol [acetaminophen] Anaphylaxis, Swelling   Darvocet [propoxyphene N-acetaminophen] Hives      Medication List    TAKE these medications   albuterol (2.5 MG/3ML) 0.083% nebulizer solution Commonly known as: PROVENTIL Inhale 3 mLs (2.5 mg total) into the lungs every 2 (two) hours as needed for wheezing or shortness of breath.   amLODipine 5 MG tablet Commonly known as: NORVASC Take 1 tablet (5 mg total) by mouth daily.   apixaban 5 MG Tabs tablet Commonly known as: ELIQUIS Take 1 tablet (5 mg total) by mouth 2 (two) times daily.   carvedilol 25 MG tablet Commonly known as: COREG Take 1 tablet (25 mg total) by mouth 2 (two) times daily.   cyclobenzaprine 10 MG tablet Commonly known as: FLEXERIL Take 10 mg by mouth in the morning, at noon, and at bedtime.   ferrous sulfate 325 (65 FE) MG tablet Take 325 mg by mouth daily as needed (weakness).   hydrALAZINE 100 MG tablet Commonly known as: APRESOLINE Take 1 tablet (100 mg total) by mouth 3 (three) times daily.   Incruse Ellipta 62.5 MCG/INH Aepb Generic drug: umeclidinium bromide Inhale 1 puff into the lungs daily. What changed:   when to take this  reasons to take this   lanthanum 1000 MG chewable tablet Commonly known as: FOSRENOL Chew 1 tablet (1,000 mg total) by mouth 3 (three) times daily with meals.   lisinopril 5 MG tablet Commonly known as:  ZESTRIL Take 1 tablet (5 mg total) by mouth daily for 5 days.   Nephro Vitamins 0.8 MG Tabs Take 1 tablet by mouth daily.   nitroGLYCERIN 0.4 MG SL tablet Commonly known as: NITROSTAT Place 1 tablet (0.4 mg total) under the tongue every 5 (five) minutes as needed for chest pain.   ondansetron 8 MG disintegrating tablet Commonly known as: Zofran ODT 61m ODT q8 hours prn nausea What changed:   how much to take  how to take this  when to take this  reasons to take this  additional instructions   oxyCODONE 15 MG immediate release tablet Commonly known as: ROXICODONE Take 15 mg by mouth See admin instructions. Take six times daily as needed for pain   promethazine 25 MG tablet Commonly known as: PHENERGAN Take 1 tablet (25 mg total) by mouth every 6 (six) hours as needed for nausea or vomiting.   scopolamine 1 MG/3DAYS Commonly known as: TRANSDERM-SCOP Place 1 patch onto the skin every 3 (three) days.   temazepam 15 MG capsule Commonly known as: RESTORIL Take 15 mg by mouth at bedtime as needed for sleep.       [place]: [place] [phone]  Allergies  Allergen Reactions  . Butalbital Anaphylaxis and Other (See Comments)  . Butalbital-Apap-Caffeine Shortness Of Breath, Swelling and Other (See Comments)    Swelling in throat  . Minoxidil Shortness Of Breath and Other (See Comments)  . Na Ferric Gluc Cplx In Sucrose Shortness Of Breath, Swelling and Other (See Comments)    Swelling in throat, tolerates Venofor  . Tylenol [Acetaminophen] Anaphylaxis and Swelling  . Darvocet [Propoxyphene N-Acetaminophen] Hives    Consultations:  Nephrology   Procedures/Studies: DG Chest Portable 1 View  Result Date: 06/10/2020 CLINICAL DATA:  Missed dialysis, short of breath EXAM: PORTABLE CHEST 1 VIEW COMPARISON:  06/07/2020, 01/22/2020, 12/13/2019 FINDINGS:  Left-sided central venous catheter tips over the right atrium. Marked cardiomegaly with vascular congestion and mild edema. Small right greater than left pleural effusions, possibly loculated on the right given lateral tracking. Aortic atherosclerosis. No pneumothorax. IMPRESSION: Cardiomegaly with vascular congestion and mild edema. Small right greater than left pleural effusions. Electronically Signed   By: Donavan Foil M.D.   On: 06/10/2020 23:41   DG Chest Portable 1 View  Result Date: 06/07/2020 CLINICAL DATA:  Cough body aches, vomiting, dialysis patient EXAM: PORTABLE CHEST 1 VIEW COMPARISON:  Radiograph 05/22/2020 CT 03/09/2020 FINDINGS: Dual lumen dialysis catheter tip terminates of the right atrium. Marked enlargement of the cardiac silhouette is similar to comparison studies and may reflect a combination of cardiomegaly and pericardial effusions seen on comparison CTs. There is diffuse hazy interstitial opacity throughout the lungs with pulmonary vascular congestion, fissural and septal thickening, and bilateral pleural effusions, moderate on the right and trace on the left. More patchy airspace opacities are present in the right mid to lower lung as well which could reflect developing alveolar edema though infection is not excluded. No acute osseous or soft tissue abnormality. IMPRESSION: 1. Findings most consistent with volume overload with pulmonary edema and bilateral pleural effusions, moderate on the right and trace on the left. More patchy airspace opacities in the right mid to lower lung could reflect developing alveolar edema though infection is not excluded. 2. Marked enlargement of the cardiac silhouette possibly reflecting a combination of true cardiomegaly and pericardial effusion seen on comparison. Electronically Signed   By: Lovena Le M.D.   On: 06/07/2020 02:29   DG Chest Portable 1 View  Result Date: 05/22/2020 CLINICAL DATA:  Shortness of breath and back pain EXAM: PORTABLE  CHEST 1 VIEW COMPARISON:  05/07/2020 FINDINGS: Central line unchanged with tip in the right atrium. Marked enlargement of the cardiac silhouette as seen previously. Persistent low level edema. Right effusion with volume loss at the right base has worsened slightly. IMPRESSION: 1. Slight worsening of right effusion and volume loss at the right base. 2. Persistent low level edema and marked enlargement of the cardiac silhouette. Electronically Signed   By: [name] M.D.   On: 05/22/2020 11:34       Subjective: No complaints. Refused labs and bg today  Discharge Exam: Vitals:   06/13/20 0840 06/13/20 1036  BP:  134/73  Pulse:    Resp:    Temp:    SpO2: 98%    Vitals:   06/12/20 1305 06/12/20 2100 06/13/20 0840 06/13/20 1036  BP: 127/80 100/84  134/73  Pulse:  81    Resp: 18 18    Temp: 98.3 F (36.8 C) 97.7 F (36.5 C)    TempSrc: Oral Oral    SpO2: 98% 97% 98%   Weight: 77.3 kg     Height:  General: Pt is alert, awake, not in acute distress Cardiovascular: RRR, S1/S2 +, no rubs, no gallops Respiratory: CTA bilaterally, no wheezing, no rhonchi Abdominal: Soft, NT, ND, bowel sounds + Extremities: no edema, no cyanosis    The results of significant diagnostics from this hospitalization (including imaging, microbiology, ancillary and laboratory) are listed below for reference.     Microbiology: Recent Results (from the past 240 hour(s))  Respiratory Panel by RT PCR (Flu A&B, Covid) - Nasopharyngeal Swab     Status: None   Collection Time: 06/07/20  2:44 AM   Specimen: Nasopharyngeal Swab  Result Value Ref Range Status   SARS Coronavirus 2 by RT PCR NEGATIVE NEGATIVE Final    Comment: (NOTE) SARS-CoV-2 target nucleic acids are NOT DETECTED.  The SARS-CoV-2 RNA is generally detectable in upper respiratoy specimens during the acute phase of infection. The lowest concentration of SARS-CoV-2 viral copies this assay can detect is 131 copies/mL. A negative result  does not preclude SARS-Cov-2 infection and should not be used as the sole basis for treatment or other patient management decisions. A negative result may occur with  improper specimen collection/handling, submission of specimen other than nasopharyngeal swab, presence of viral mutation(s) within the areas targeted by this assay, and inadequate number of viral copies (<131 copies/mL). A negative result must be combined with clinical observations, patient history, and epidemiological information. The expected result is Negative.  Fact Sheet for Patients:  PinkCheek.be  Fact Sheet for Healthcare Providers:  GravelBags.it  This test is no t yet approved or cleared by the [place] FDA and  has been authorized for detection and/or diagnosis of SARS-CoV-2 by FDA under an Emergency Use Authorization (EUA). This EUA will remain  in effect (meaning this test can be used) for the duration of the COVID-19 declaration under Section 564(b)(1) of the Act, 21 U.S.C. section 360bbb-3(b)(1), unless the authorization is terminated or revoked sooner.     Influenza A by PCR NEGATIVE NEGATIVE Final   Influenza B by PCR NEGATIVE NEGATIVE Final    Comment: (NOTE) The Xpert Xpress SARS-CoV-2/FLU/RSV assay is intended as an aid in  the diagnosis of influenza from Nasopharyngeal swab specimens and  should not be used as a sole basis for treatment. Nasal washings and  aspirates are unacceptable for Xpert Xpress SARS-CoV-2/FLU/RSV  testing.  Fact Sheet for Patients: PinkCheek.be  Fact Sheet for Healthcare Providers: GravelBags.it  This test is not yet approved or cleared by the [place] FDA and  has been authorized for detection and/or diagnosis of SARS-CoV-2 by  FDA under an Emergency Use Authorization (EUA). This EUA will remain  in effect (meaning this test can be used) for  the duration of the  Covid-19 declaration under Section 564(b)(1) of the Act, 21  U.S.C. section 360bbb-3(b)(1), unless the authorization is  terminated or revoked. Performed at [facility], [place] [address], [place], [place] [address]   Respiratory Panel by RT PCR (Flu A&B, Covid) - Nasopharyngeal Swab     Status: None   Collection Time: 06/11/20  2:27 AM   Specimen: Nasopharyngeal Swab; Nasopharyngeal(NP) swabs in vial transport medium  Result Value Ref Range Status   SARS Coronavirus 2 by RT PCR NEGATIVE NEGATIVE Final    Comment: (NOTE) SARS-CoV-2 target nucleic acids are NOT DETECTED.  The SARS-CoV-2 RNA is generally detectable in upper respiratoy specimens during the acute phase of infection. The lowest concentration of SARS-CoV-2 viral copies this assay can detect is 131 copies/mL. A negative result  does not preclude SARS-Cov-2 infection and should not be used as the sole basis for treatment or other patient management decisions. A negative result may occur with  improper specimen collection/handling, submission of specimen other than nasopharyngeal swab, presence of viral mutation(s) within the areas targeted by this assay, and inadequate number of viral copies (<131 copies/mL). A negative result must be combined with clinical observations, patient history, and epidemiological information. The expected result is Negative.  Fact Sheet for Patients:  PinkCheek.be  Fact Sheet for Healthcare Providers:  GravelBags.it  This test is no t yet approved or cleared by the [place] FDA and  has been authorized for detection and/or diagnosis of SARS-CoV-2 by FDA under an Emergency Use Authorization (EUA). This EUA will remain  in effect (meaning this test can be used) for the duration of the COVID-19 declaration under Section 564(b)(1) of the Act, 21 U.S.C. section 360bbb-3(b)(1), unless the  authorization is terminated or revoked sooner.     Influenza A by PCR NEGATIVE NEGATIVE Final   Influenza B by PCR NEGATIVE NEGATIVE Final    Comment: (NOTE) The Xpert Xpress SARS-CoV-2/FLU/RSV assay is intended as an aid in  the diagnosis of influenza from Nasopharyngeal swab specimens and  should not be used as a sole basis for treatment. Nasal washings and  aspirates are unacceptable for Xpert Xpress SARS-CoV-2/FLU/RSV  testing.  Fact Sheet for Patients: PinkCheek.be  Fact Sheet for Healthcare Providers: GravelBags.it  This test is not yet approved or cleared by the [place] FDA and  has been authorized for detection and/or diagnosis of SARS-CoV-2 by  FDA under an Emergency Use Authorization (EUA). This EUA will remain  in effect (meaning this test can be used) for the duration of the  Covid-19 declaration under Section 564(b)(1) of the Act, 21  U.S.C. section 360bbb-3(b)(1), unless the authorization is  terminated or revoked. Performed at [facility], [place] [address], [place], [place] [address]      Labs: BNP (last 3 results) No results for input(s): BNP in the last 8760 hours. Basic Metabolic Panel: Recent Labs  Lab 06/11/20 0141  NA 140  K 5.3*  CL 102  CO2 22  GLUCOSE 87  BUN 84*  CREATININE 14.10*  CALCIUM 8.0*   Liver Function Tests: Recent Labs  Lab 06/11/20 0141  AST 16  ALT 10  ALKPHOS 197*  BILITOT 0.8  PROT 7.8  ALBUMIN 3.0*   Recent Labs  Lab 06/11/20 0141  LIPASE 75*   No results for input(s): AMMONIA in the last 168 hours. CBC: Recent Labs  Lab 06/11/20 0141  WBC 3.4*  HGB 8.6*  HCT 29.0*  MCV 86.8  PLT 115*   Cardiac Enzymes: No results for input(s): CKTOTAL, CKMB, CKMBINDEX, TROPONINI in the last 168 hours. BNP: Invalid input(s): POCBNP CBG: Recent Labs  Lab 06/11/20 1233 06/11/20 1246 06/11/20 2134 06/12/20 2019 06/13/20 0802  GLUCAP 69* 73 95  130* 96   D-Dimer No results for input(s): DDIMER in the last 72 hours. Hgb A1c No results for input(s): HGBA1C in the last 72 hours. Lipid Profile No results for input(s): CHOL, HDL, LDLCALC, TRIG, CHOLHDL, LDLDIRECT in the last 72 hours. Thyroid function studies No results for input(s): TSH, T4TOTAL, T3FREE, THYROIDAB in the last 72 hours.  Invalid input(s): FREET3 Anemia work up No results for input(s): VITAMINB12, FOLATE, FERRITIN, TIBC, IRON, RETICCTPCT in the last 72 hours. Urinalysis    Component Value Date/Time   COLORURINE YELLOW 10/18/2013 0419  APPEARANCEUR CLEAR 10/18/2013 0419   LABSPEC 1.008 10/18/2013 0419   PHURINE 8.5 (H) 10/18/2013 0419   GLUCOSEU 100 (A) 10/18/2013 0419   HGBUR TRACE (A) 10/18/2013 0419   BILIRUBINUR NEGATIVE 10/18/2013 0419   KETONESUR NEGATIVE 10/18/2013 0419   PROTEINUR 100 (A) 10/18/2013 0419   UROBILINOGEN 0.2 10/18/2013 0419   NITRITE NEGATIVE 10/18/2013 0419   LEUKOCYTESUR NEGATIVE 10/18/2013 0419   Sepsis Labs Invalid input(s): PROCALCITONIN,  WBC,  LACTICIDVEN Microbiology Recent Results (from the past 240 hour(s))  Respiratory Panel by RT PCR (Flu A&B, Covid) - Nasopharyngeal Swab     Status: None   Collection Time: 06/07/20  2:44 AM   Specimen: Nasopharyngeal Swab  Result Value Ref Range Status   SARS Coronavirus 2 by RT PCR NEGATIVE NEGATIVE Final    Comment: (NOTE) SARS-CoV-2 target nucleic acids are NOT DETECTED.  The SARS-CoV-2 RNA is generally detectable in upper respiratoy specimens during the acute phase of infection. The lowest concentration of SARS-CoV-2 viral copies this assay can detect is 131 copies/mL. A negative result does not preclude SARS-Cov-2 infection and should not be used as the sole basis for treatment or other patient management decisions. A negative result may occur with  improper specimen collection/handling, submission of specimen other than nasopharyngeal swab, presence of viral mutation(s)  within the areas targeted by this assay, and inadequate number of viral copies (<131 copies/mL). A negative result must be combined with clinical observations, patient history, and epidemiological information. The expected result is Negative.  Fact Sheet for Patients:  PinkCheek.be  Fact Sheet for Healthcare Providers:  GravelBags.it  This test is no t yet approved or cleared by the [place] FDA and  has been authorized for detection and/or diagnosis of SARS-CoV-2 by FDA under an Emergency Use Authorization (EUA). This EUA will remain  in effect (meaning this test can be used) for the duration of the COVID-19 declaration under Section 564(b)(1) of the Act, 21 U.S.C. section 360bbb-3(b)(1), unless the authorization is terminated or revoked sooner.     Influenza A by PCR NEGATIVE NEGATIVE Final   Influenza B by PCR NEGATIVE NEGATIVE Final    Comment: (NOTE) The Xpert Xpress SARS-CoV-2/FLU/RSV assay is intended as an aid in  the diagnosis of influenza from Nasopharyngeal swab specimens and  should not be used as a sole basis for treatment. Nasal washings and  aspirates are unacceptable for Xpert Xpress SARS-CoV-2/FLU/RSV  testing.  Fact Sheet for Patients: PinkCheek.be  Fact Sheet for Healthcare Providers: GravelBags.it  This test is not yet approved or cleared by the [place] FDA and  has been authorized for detection and/or diagnosis of SARS-CoV-2 by  FDA under an Emergency Use Authorization (EUA). This EUA will remain  in effect (meaning this test can be used) for the duration of the  Covid-19 declaration under Section 564(b)(1) of the Act, 21  U.S.C. section 360bbb-3(b)(1), unless the authorization is  terminated or revoked. Performed at [facility], [place] [address], [place], [place] [address]   Respiratory Panel by RT PCR (Flu  A&B, Covid) - Nasopharyngeal Swab     Status: None   Collection Time: 06/11/20  2:27 AM   Specimen: Nasopharyngeal Swab; Nasopharyngeal(NP) swabs in vial transport medium  Result Value Ref Range Status   SARS Coronavirus 2 by RT PCR NEGATIVE NEGATIVE Final    Comment: (NOTE) SARS-CoV-2 target nucleic acids are NOT DETECTED.  The SARS-CoV-2 RNA is generally detectable in upper respiratoy specimens during the acute phase of infection.  The lowest concentration of SARS-CoV-2 viral copies this assay can detect is 131 copies/mL. A negative result does not preclude SARS-Cov-2 infection and should not be used as the sole basis for treatment or other patient management decisions. A negative result may occur with  improper specimen collection/handling, submission of specimen other than nasopharyngeal swab, presence of viral mutation(s) within the areas targeted by this assay, and inadequate number of viral copies (<131 copies/mL). A negative result must be combined with clinical observations, patient history, and epidemiological information. The expected result is Negative.  Fact Sheet for Patients:  PinkCheek.be  Fact Sheet for Healthcare Providers:  GravelBags.it  This test is no t yet approved or cleared by the [place] FDA and  has been authorized for detection and/or diagnosis of SARS-CoV-2 by FDA under an Emergency Use Authorization (EUA). This EUA will remain  in effect (meaning this test can be used) for the duration of the COVID-19 declaration under Section 564(b)(1) of the Act, 21 U.S.C. section 360bbb-3(b)(1), unless the authorization is terminated or revoked sooner.     Influenza A by PCR NEGATIVE NEGATIVE Final   Influenza B by PCR NEGATIVE NEGATIVE Final    Comment: (NOTE) The Xpert Xpress SARS-CoV-2/FLU/RSV assay is intended as an aid in  the diagnosis of influenza from Nasopharyngeal swab specimens and   should not be used as a sole basis for treatment. Nasal washings and  aspirates are unacceptable for Xpert Xpress SARS-CoV-2/FLU/RSV  testing.  Fact Sheet for Patients: PinkCheek.be  Fact Sheet for Healthcare Providers: GravelBags.it  This test is not yet approved or cleared by the [place] FDA and  has been authorized for detection and/or diagnosis of SARS-CoV-2 by  FDA under an Emergency Use Authorization (EUA). This EUA will remain  in effect (meaning this test can be used) for the duration of the  Covid-19 declaration under Section 564(b)(1) of the Act, 21  U.S.C. section 360bbb-3(b)(1), unless the authorization is  terminated or revoked. Performed at [facility], [place] [address] [place], [place] [address]      Time coordinating discharge: Over 30 minutes  SIGNED:   Nolberto Hanlon, MD  [facility] 06/13/2020, 2:04 PM Pager   If 7PM-7AM, please contact night-coverage [email or web address] Password [email or web address]

## 2020-06-13 NOTE — Progress Notes (Signed)
Refused MN and 0400 CBG also refused blood draw. Will try again at 0800

## 2020-06-13 NOTE — Progress Notes (Signed)
PT Cancellation Note  Patient Details Name: KINTE TRIM MRN: [id number] DOB: 04-29-1966   Cancelled Treatment:    Reason Eval/Treat Not Completed: PT screened, no needs identified, will sign off observed patient walking in his room and the hallway, when PT entered room he immediately refused stating "I don't need that". No concerns noted with mobility. Signing off at this time.    Windell Norfolk, DPT, PN1   Supplemental Physical Therapist [facility]    Pager [phone] Acute Rehab Office [phone]

## 2020-06-13 NOTE — Progress Notes (Signed)
Frank [name] Progress Note   Subjective:   Reports he his breathing is back to baseline with no SOB, orthopnea, CP, or dizziness. Feeling cold, otherwise no concerns this AM. Has been refusing labs.   Objective Vitals:   06/12/20 1255 06/12/20 1305 06/12/20 2100 06/13/20 0840  BP: (!) 159/60 127/80 100/84   Pulse:   81   Resp: _0 Temp:  98.3 F (36.8 C) 97.7 F (36.5 C)   TempSrc:  Oral Oral   SpO2:  98% 97% 98%  Weight:  77.3 kg    Height:       Physical Exam General: Alert and in NAD Heart: RRR, no murmurs, rubs or gallops Lungs: CTA bilaterally without wheezing, rhonchi or rales Abdomen: Soft, non-distended, +BS Extremities: No edema bilateral lower extremities Dialysis Access:  R IJ TDC dressing intact  Additional Objective Labs: Basic Metabolic Panel: Recent Labs  Lab 06/11/20 0141  NA 140  K 5.3*  CL 102  CO2 22  GLUCOSE 87  BUN 84*  CREATININE 14.10*  CALCIUM 8.0*   Liver Function Tests: Recent Labs  Lab 06/11/20 0141  AST 16  ALT 10  ALKPHOS 197*  BILITOT 0.8  PROT 7.8  ALBUMIN 3.0*   Recent Labs  Lab 06/11/20 0141  LIPASE 75*   CBC: Recent Labs  Lab 06/11/20 0141  WBC 3.4*  HGB 8.6*  HCT 29.0*  MCV 86.8  PLT 115*   Blood Culture    Component Value Date/Time   SDES PLEURAL 07/29/2018 1300   SDES PLEURAL 07/29/2018 1300   SPECREQUEST NONE 07/29/2018 1300   SPECREQUEST NONE 07/29/2018 1300   CULT  07/29/2018 1300    NO GROWTH 5 DAYS Performed at [facility], [place] [address], [place], [place] [address]    REPTSTATUS 08/03/2018 FINAL 07/29/2018 1300   REPTSTATUS 07/29/2018 FINAL 07/29/2018 1300    CBG: Recent Labs  Lab 06/11/20 1233 06/11/20 1246 06/11/20 2134 06/12/20 2019 06/13/20 0802  GLUCAP 69* 73 95 130* 96   Medications:  . amLODipine  5 mg Oral Daily  . apixaban  5 mg Oral BID  . carvedilol  25 mg Oral BID  . Chlorhexidine Gluconate Cloth  6 each Topical Q0600  .  cyclobenzaprine  10 mg Oral TID  . hydrALAZINE  100 mg Oral TID  . lanthanum  1,000 mg Oral TID WC  . lisinopril  5 mg Oral Daily  . multivitamin  1 tablet Oral Daily  . sodium chloride flush  3 mL Intravenous Q12H  . umeclidinium bromide  1 puff Inhalation Daily    Dialysis Orders: last OP orders Aug 2021 >4h L IJ TDC 2/2 bath No heparin  CXR 11/20- vasc congestion w/ pulm edema  Assessment/Plan: 1. SOB / pulm edema / vol overload - Had serial HD 11/21 and 11/22. SOB resolved, back on room air.  Ok to discharge from a renal perspective.  2. ESRD - on HD but w/o an outpatient HD unit at this time due to behavior issues. Was dc'd from OP HD by [facility] in October. Discussed that good compliance here will make outpatient dialysis units more likely to accept him (needs to stop refusing blood draws, etc).  3. Hx DVT/ PE - on eliquis 4. HTN - BP now controlled, continue current medications.  5. MBD ckd -Corrected calcium at goal, last phos slightly high. Continue lanthanum binder.  6. Nausea/vomiting- resolved with HD  Anice Paganini, PA-C 06/13/2020, 9:07 AM  [name] Pager: [phone]

## 2020-06-14 ENCOUNTER — Emergency Department (HOSPITAL_COMMUNITY)
Admission: EM | Admit: 2020-06-14 | Discharge: 2020-06-14 | Disposition: A | Discharge status: Home / Self Care | Payer: Medicare Other | Attending: Emergency Medicine | Admitting: Emergency Medicine

## 2020-06-14 DIAGNOSIS — N186 End stage renal disease: Secondary | ICD-10-CM | POA: Diagnosis not present

## 2020-06-14 DIAGNOSIS — J441 Chronic obstructive pulmonary disease with (acute) exacerbation: Secondary | ICD-10-CM | POA: Diagnosis not present

## 2020-06-14 DIAGNOSIS — Z86718 Personal history of other venous thrombosis and embolism: Secondary | ICD-10-CM | POA: Insufficient documentation

## 2020-06-14 DIAGNOSIS — Z7951 Long term (current) use of inhaled steroids: Secondary | ICD-10-CM | POA: Diagnosis not present

## 2020-06-14 DIAGNOSIS — M545 Low back pain, unspecified: Secondary | ICD-10-CM | POA: Diagnosis present

## 2020-06-14 DIAGNOSIS — M549 Dorsalgia, unspecified: Secondary | ICD-10-CM

## 2020-06-14 DIAGNOSIS — R079 Chest pain, unspecified: Secondary | ICD-10-CM | POA: Insufficient documentation

## 2020-06-14 DIAGNOSIS — E1122 Type 2 diabetes mellitus with diabetic chronic kidney disease: Secondary | ICD-10-CM | POA: Insufficient documentation

## 2020-06-14 DIAGNOSIS — M25551 Pain in right hip: Secondary | ICD-10-CM | POA: Insufficient documentation

## 2020-06-14 DIAGNOSIS — M25552 Pain in left hip: Secondary | ICD-10-CM | POA: Diagnosis not present

## 2020-06-14 DIAGNOSIS — I132 Hypertensive heart and chronic kidney disease with heart failure and with stage 5 chronic kidney disease, or end stage renal disease: Secondary | ICD-10-CM | POA: Insufficient documentation

## 2020-06-14 DIAGNOSIS — Z79899 Other long term (current) drug therapy: Secondary | ICD-10-CM | POA: Insufficient documentation

## 2020-06-14 DIAGNOSIS — Z87891 Personal history of nicotine dependence: Secondary | ICD-10-CM | POA: Insufficient documentation

## 2020-06-14 DIAGNOSIS — E1129 Type 2 diabetes mellitus with other diabetic kidney complication: Secondary | ICD-10-CM | POA: Insufficient documentation

## 2020-06-14 DIAGNOSIS — R112 Nausea with vomiting, unspecified: Secondary | ICD-10-CM | POA: Diagnosis not present

## 2020-06-14 DIAGNOSIS — Z992 Dependence on renal dialysis: Secondary | ICD-10-CM | POA: Insufficient documentation

## 2020-06-14 DIAGNOSIS — Z7901 Long term (current) use of anticoagulants: Secondary | ICD-10-CM | POA: Diagnosis not present

## 2020-06-14 DIAGNOSIS — Z86711 Personal history of pulmonary embolism: Secondary | ICD-10-CM | POA: Insufficient documentation

## 2020-06-14 DIAGNOSIS — I5042 Chronic combined systolic (congestive) and diastolic (congestive) heart failure: Secondary | ICD-10-CM | POA: Insufficient documentation

## 2020-06-14 LAB — CBG MONITORING, ED: Glucose-Capillary: 63 mg/dL — ABNORMAL LOW (ref 70–99)

## 2020-06-14 MED ORDER — OXYCODONE HCL 5 MG PO TABS
15.0000 mg | ORAL_TABLET | Freq: Once | ORAL | Status: AC
  Administered 2020-06-14: 15 mg via ORAL
  Filled 2020-06-14: qty 3

## 2020-06-14 NOTE — ED Notes (Signed)
Pt states last emesis was before arriving to the ED. Pt has drank 1 orange juice and consumed cheese/crackers from night shift RN with no report of emesis. Pt requesting apple juice and more cheese/crackers

## 2020-06-14 NOTE — ED Notes (Signed)
Pt refusing blood draw from lab tech

## 2020-06-14 NOTE — Discharge Instructions (Signed)
Please follow-up with dialysis as planned.

## 2020-06-14 NOTE — ED Notes (Signed)
Left AVF has a good thrill and bruit.

## 2020-06-14 NOTE — ED Triage Notes (Signed)
Patient here stating that his back, chest, and both hips are hurting.

## 2020-06-14 NOTE — ED Notes (Signed)
Pt refusing IV stick from ED RNs without ultrasound. Ultrasound IV team tech unable to come to ED for blood draw. Lab tech contacted to attempt blood draw
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
## 2020-06-15 IMAGING — CT CT ABD-PELV W/O CM
2 of 4 series · 14 of 46 positions shown, 16 images · non-contrast
Comparison: Multiple prior CT examinations, most recent January 16, 2018

CLINICAL DATA: Abdominal pain and emesis. Recent small-bowel
obstruction. Renal failure

EXAM:
CT ABDOMEN AND PELVIS WITHOUT CONTRAST
TECHNIQUE: Multidetector CT imaging of the abdomen and pelvis was performed
following the standard protocol without oral or IV contrast.

[Series 3: a/p w/o 5mm · axial · non-contrast · 0.66mm/px · z∈[+847,+1212]mm · 11 of 89 slices shown, 13 images]
[im 8/89  soft-tissue]
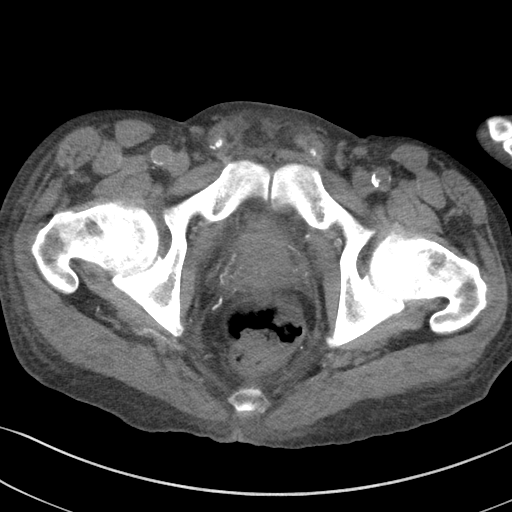
[im 8/89  bone]
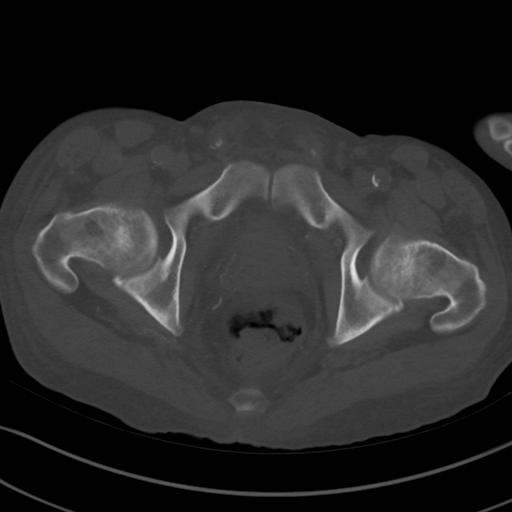
[im 15/89  soft-tissue]
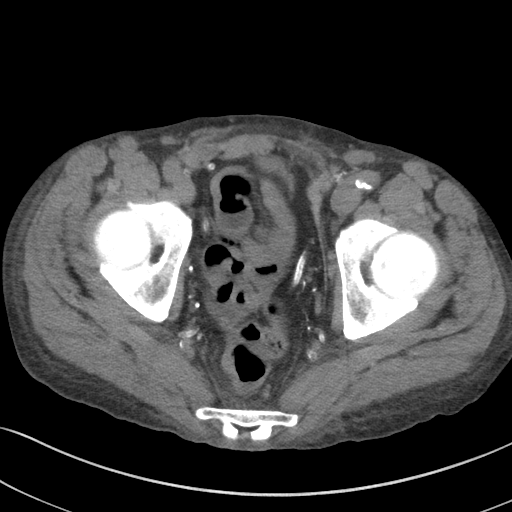
[im 23/89  soft-tissue]
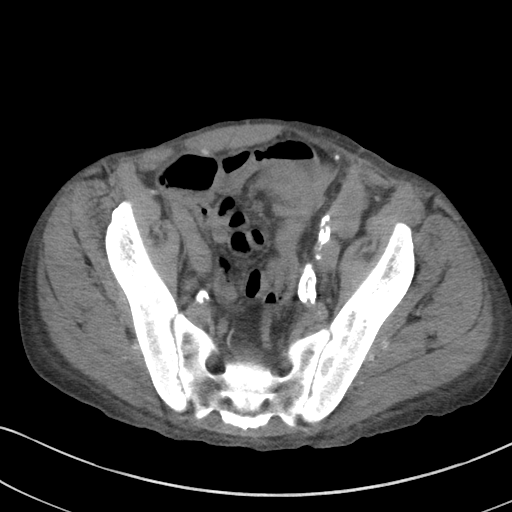
[im 30/89  soft-tissue]
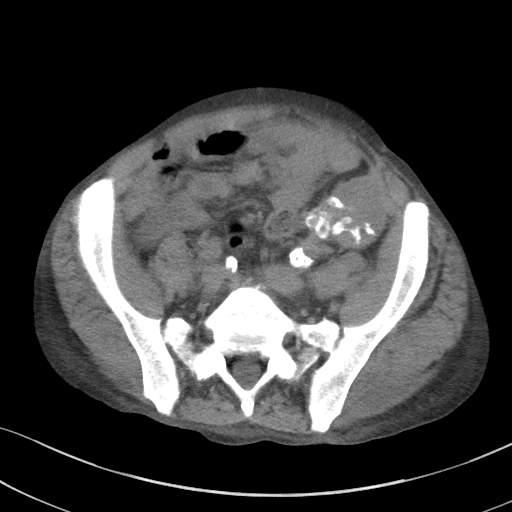
[im 37/89  soft-tissue]
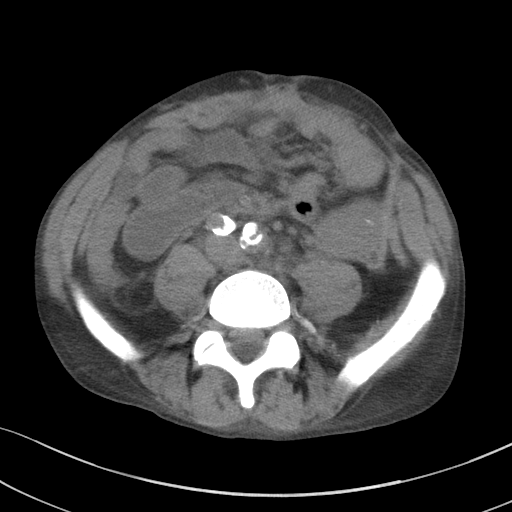
[im 45/89  soft-tissue]
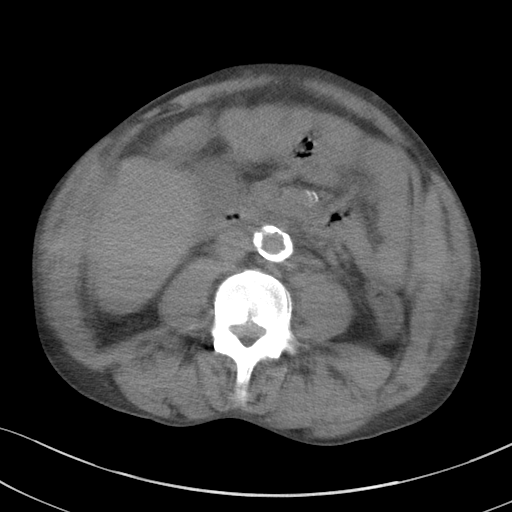
[im 52/89  soft-tissue]
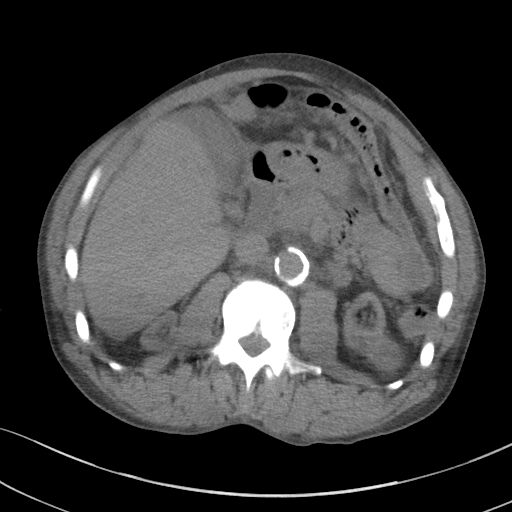
[im 59/89  soft-tissue]
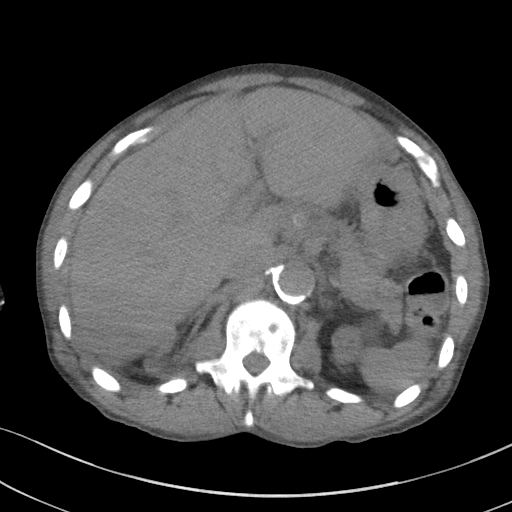
[im 67/89  soft-tissue]
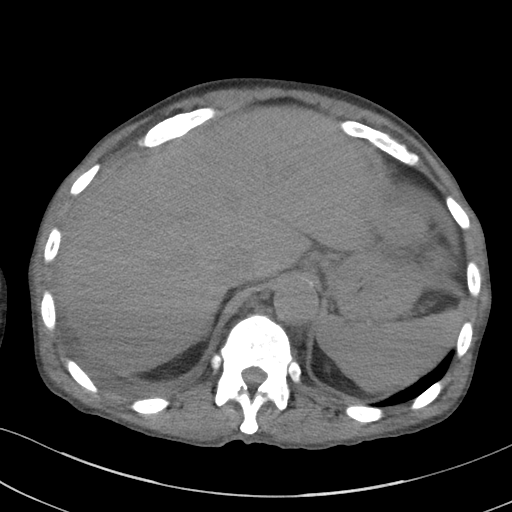
[im 67/89  bone]
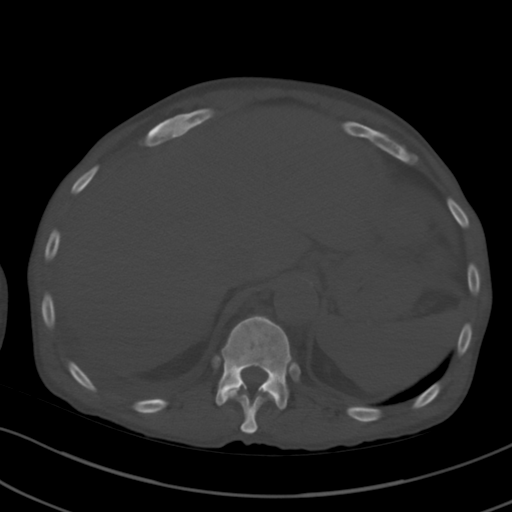
[im 74/89  soft-tissue]
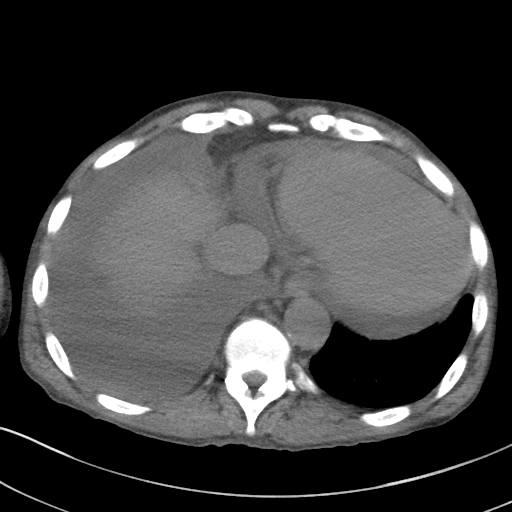
[im 81/89  soft-tissue]
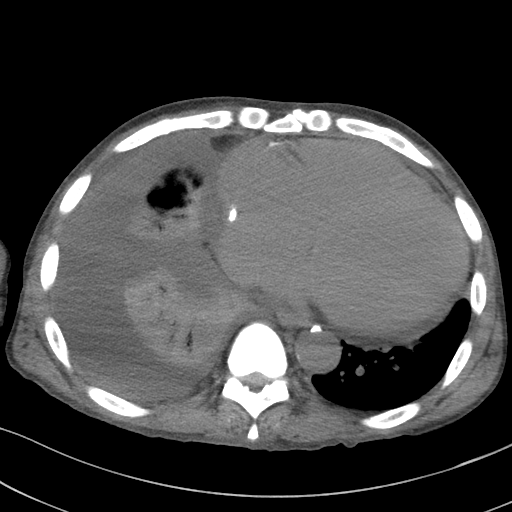

[Series 6: a/p w/o cor · coronal · non-contrast · 0.66mm/px · 3 of 151 slices shown]
[im 51/151  soft-tissue]
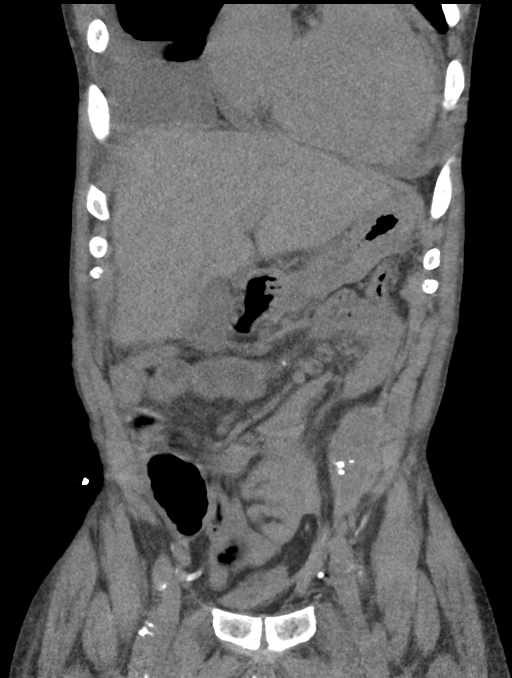
[im 67/151  soft-tissue]
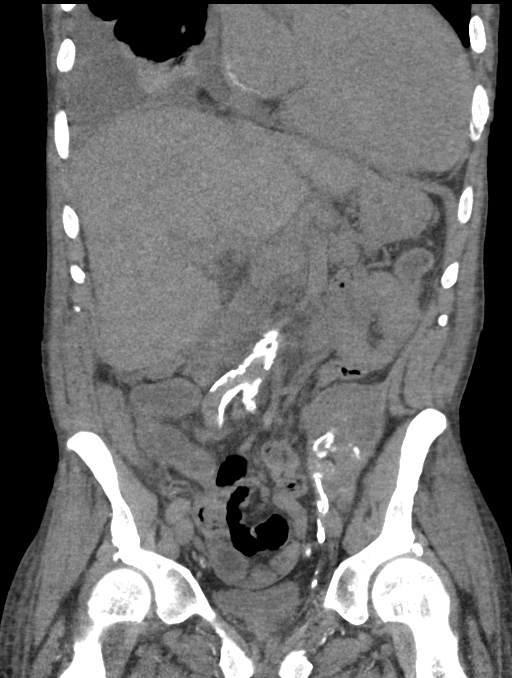
[im 84/151  soft-tissue]
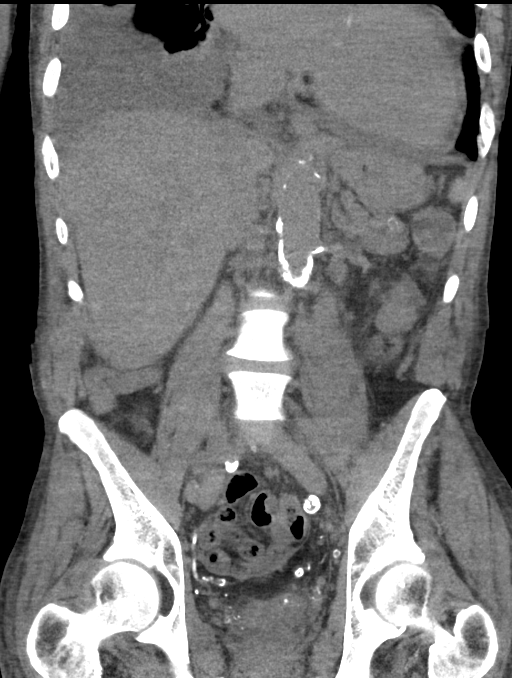

[14 of 46 positions shown; findings below may reference images not displayed]

FINDINGS: Lower chest: There is a sizable pleural effusion on the right with
consolidation in the right base posteriorly. Visualized left lung
base shows mild atelectasis. There is cardiomegaly with foci of
coronary artery calcification. There is a small pericardial
effusion.

Hepatobiliary: No focal liver lesions are evident on this
noncontrast enhanced study. Gallbladder wall is not appreciably
thickened. There is no biliary duct dilatation.

Pancreas: No pancreatic mass or inflammatory focus is evident.

Spleen: No splenic lesions are evident.

Adrenals/Urinary Tract: Adrenals appear normal bilaterally. Native
kidneys are atrophic bilaterally. There is a 2.1 x 2.1 cm cyst
arising from the upper pole left kidney. There is a thick-walled
complex mass arising from the lateral left kidney measuring 3.3 x
3.1 cm. There is no evident hydronephrosis on either side.

In the left anterior pelvis, there is a stable heavily calcified
ovoid mass consistent with failed transplant kidney. There is no
appreciable recognizable renal tissue in this area.

There is no evident renal or ureteral calculus on either side.
Urinary bladder is midline with mild wall thickening.

Stomach/Bowel: Most bowel loops are fluid filled. There is no frank
bowel wall thickening or transition zone to suggest bowel
obstruction. No free air or portal venous air is evident.

Vascular/Lymphatic: There is extensive aortoiliac atherosclerosis.
There is atherosclerotic calcification throughout major mesenteric
and pelvic arterial vessels. No mesenteric arterial obstruction is
seen on this noncontrast enhanced study. There is no frank aneurysm.
There is no appreciable adenopathy in the abdomen or pelvis.

Reproductive: Prostate and seminal vesicles appear normal. There is
extensive seminal vesicle calcification. There is calcification in
the arteries of the penile and scrotal regions.

Other: There is slight ascites in the abdomen and pelvis. Appendix
region appears unremarkable. No abscess is evident in the abdomen or
pelvis.

Musculoskeletal: The bones show diffuse sclerosis consistent with
renal osteodystrophy. No destructive lesions are evident. There are
Schmorl's nodes at several levels in the lower thoracic and lumbar
regions. No intramuscular lesions are evident.
IMPRESSION: 1. Evidence of renal failure with atrophic native kidneys
bilaterally. Failed transplant kidney with extensive calcification
noted in the left anterior pelvis.

2. Indeterminate mass arising from lateral mid left kidney measuring
3.3 x 3.1 cm. A renal neoplasm in this area cannot be excluded. Note
that patients with chronic renal failure have increased risk for
renal cell carcinoma development. This finding may warrant
nonemergent abdominal MR for further assessment.

3. There is fluid throughout most of the bowel, likely due to a
degree of ileus or enteritis. No frank bowel obstruction evident.

4. Sizable right pleural effusion with consolidation/atelectasis
right base.

5. Extensive atherosclerotic calcification throughout the aorta as
well as major pelvic and mesenteric arterial vessels. No frank
aneurysm. There are multiple foci of coronary artery calcification.

6.  Cardiomegaly with small pericardial effusion.

7. Mild ascites. No abscess in the abdomen or pelvis. No
periappendiceal region inflammation.

8.  Bones show diffuse changes of renal osteodystrophy.

Aortic Atherosclerosis ([id number]).

## 2020-06-16 ENCOUNTER — Emergency Department (HOSPITAL_COMMUNITY)
Admission: EM | Admit: 2020-06-16 | Discharge: 2020-06-17 | Disposition: A | Discharge status: Home / Self Care | Payer: Medicare Other | Attending: Emergency Medicine | Admitting: Emergency Medicine

## 2020-06-16 ENCOUNTER — Encounter (HOSPITAL_COMMUNITY): Payer: Self-pay | Admitting: Emergency Medicine

## 2020-06-16 DIAGNOSIS — G8929 Other chronic pain: Secondary | ICD-10-CM | POA: Diagnosis not present

## 2020-06-16 DIAGNOSIS — Z20822 Contact with and (suspected) exposure to covid-19: Secondary | ICD-10-CM | POA: Diagnosis not present

## 2020-06-16 DIAGNOSIS — E1122 Type 2 diabetes mellitus with diabetic chronic kidney disease: Secondary | ICD-10-CM | POA: Insufficient documentation

## 2020-06-16 DIAGNOSIS — Z87891 Personal history of nicotine dependence: Secondary | ICD-10-CM | POA: Diagnosis not present

## 2020-06-16 DIAGNOSIS — I5042 Chronic combined systolic (congestive) and diastolic (congestive) heart failure: Secondary | ICD-10-CM | POA: Diagnosis not present

## 2020-06-16 DIAGNOSIS — N186 End stage renal disease: Secondary | ICD-10-CM | POA: Diagnosis not present

## 2020-06-16 DIAGNOSIS — I132 Hypertensive heart and chronic kidney disease with heart failure and with stage 5 chronic kidney disease, or end stage renal disease: Secondary | ICD-10-CM | POA: Insufficient documentation

## 2020-06-16 DIAGNOSIS — J441 Chronic obstructive pulmonary disease with (acute) exacerbation: Secondary | ICD-10-CM | POA: Insufficient documentation

## 2020-06-16 DIAGNOSIS — Z79899 Other long term (current) drug therapy: Secondary | ICD-10-CM | POA: Diagnosis not present

## 2020-06-16 DIAGNOSIS — R079 Chest pain, unspecified: Secondary | ICD-10-CM | POA: Insufficient documentation

## 2020-06-16 DIAGNOSIS — R109 Unspecified abdominal pain: Secondary | ICD-10-CM | POA: Diagnosis present

## 2020-06-16 LAB — RESP PANEL BY RT-PCR (FLU A&B, COVID) ARPGX2
Influenza A by PCR: NEGATIVE
Influenza B by PCR: NEGATIVE
SARS Coronavirus 2 by RT PCR: NEGATIVE

## 2020-06-16 MED ORDER — PENTAFLUOROPROP-TETRAFLUOROETH EX AERO: 1.0000 "application " | INHALATION_SPRAY | CUTANEOUS | Status: DC | PRN

## 2020-06-16 MED ORDER — ONDANSETRON 4 MG PO TBDP
8.0000 mg | ORAL_TABLET | Freq: Once | ORAL | Status: AC
  Administered 2020-06-16: 8 mg via ORAL
  Filled 2020-06-16: qty 2

## 2020-06-16 MED ORDER — APIXABAN 5 MG PO TABS
5.0000 mg | ORAL_TABLET | Freq: Once | ORAL | Status: AC
  Administered 2020-06-16: 5 mg via ORAL
  Filled 2020-06-16: qty 1

## 2020-06-16 MED ORDER — LIDOCAINE-PRILOCAINE 2.5-2.5 % EX CREA: 1.0000 "application " | TOPICAL_CREAM | CUTANEOUS | Status: DC | PRN

## 2020-06-16 MED ORDER — SODIUM CHLORIDE 0.9 % IV SOLN: 100.0000 mL | INTRAVENOUS | Status: DC | PRN

## 2020-06-16 MED ORDER — OXYCODONE HCL 5 MG PO TABS
15.0000 mg | ORAL_TABLET | ORAL | Status: AC
  Administered 2020-06-16: 15 mg via ORAL
  Filled 2020-06-16: qty 3

## 2020-06-16 MED ORDER — AMLODIPINE BESYLATE 5 MG PO TABS
5.0000 mg | ORAL_TABLET | Freq: Once | ORAL | Status: AC
  Administered 2020-06-16: 5 mg via ORAL
  Filled 2020-06-16: qty 1

## 2020-06-16 MED ORDER — HYDRALAZINE HCL 25 MG PO TABS
100.0000 mg | ORAL_TABLET | Freq: Once | ORAL | Status: AC
  Administered 2020-06-16: 100 mg via ORAL
  Filled 2020-06-16: qty 4

## 2020-06-16 MED ORDER — CARVEDILOL 3.125 MG PO TABS
25.0000 mg | ORAL_TABLET | Freq: Once | ORAL | Status: AC
  Administered 2020-06-16: 25 mg via ORAL
  Filled 2020-06-16: qty 8

## 2020-06-16 MED ORDER — OXYCODONE HCL 5 MG PO TABS
15.0000 mg | ORAL_TABLET | Freq: Once | ORAL | Status: AC
  Administered 2020-06-16: 15 mg via ORAL
  Filled 2020-06-16: qty 3

## 2020-06-16 MED ORDER — HEPARIN SODIUM (PORCINE) 1000 UNIT/ML DIALYSIS
1000.0000 [IU] | INTRAMUSCULAR | Status: DC | PRN
  Filled 2020-06-16 (×2): qty 1

## 2020-06-16 MED ORDER — CHLORHEXIDINE GLUCONATE CLOTH 2 % EX PADS: 6.0000 | MEDICATED_PAD | Freq: Every day | CUTANEOUS | Status: DC

## 2020-06-16 MED ORDER — LIDOCAINE HCL (PF) 1 % IJ SOLN: 5.0000 mL | INTRAMUSCULAR | Status: DC | PRN

## 2020-06-16 NOTE — ED Notes (Signed)
Dr. Betsey Holiday made aware that we are unable to get blood work

## 2020-06-16 NOTE — ED Notes (Signed)
Patient agreeing to be straight stuck for labs. Ida to draw same.

## 2020-06-16 NOTE — ED Triage Notes (Signed)
Pt reports abdominal pain and emesis "all day."  Last went to dialysis on Tuesday, is scheduled to go today.  Pt reports weakness.

## 2020-06-16 NOTE — ED Notes (Signed)
Patient declining pain medicine at this time, wishing to hold until Zofran kicks in.

## 2020-06-16 NOTE — ED Notes (Signed)
Dinner Tray Ordered @ [phone].

## 2020-06-16 NOTE — Discharge Instructions (Addendum)
Follow up with your doctor as usual and get your dialysis as scheduled.

## 2020-06-16 NOTE — Progress Notes (Signed)
[facility] Nephrology Note:  Aware that Mr. Shean is here and in need of dialysis. He still does not have an outpatient HD unit. Presented with abdominal and R sided chest pain. Per Epic, looks like his last HD was on 06/12/20. Lab draw attempted, unable to get. COVID swab pending.  Blood pressure (!) 173/70, pulse 70, temperature (!) 97.3 F (36.3 C), temperature source Oral, resp. rate 16, SpO2 98 %.  Plan: - Will plan to dialyze him TODAY - We can get labs on him at start of dialysis - Hopefully he will be able to discharge home from the ED after dialysis today  Veneta Penton, PA-C [place] Pager [phone]

## 2020-06-16 NOTE — ED Notes (Signed)
Patient declined for RN to start IV/draw blood. Dr. Betsey Holiday made aware of same.

## 2020-06-16 NOTE — ED Notes (Signed)
Stuck mr.Harari x2 no blood return notified nurse
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
## 2020-06-16 NOTE — ED Notes (Signed)
Called dialysis to get update. Said that they have been very busy, will be later tonight or maybe even tomorrow for pt to get dialysis.

## 2020-06-16 NOTE — ED Notes (Signed)
Lunch Tray Ordered @ [phone].

## 2020-06-16 NOTE — ED Notes (Addendum)
PT offered BP meds, pt refused. Michela Pitcher he takes those meds after dialysis.

## 2020-06-16 NOTE — ED Notes (Signed)
ED Provider at bedside. 

## 2020-06-16 NOTE — ED Notes (Signed)
Pt refused to allow blood draw in triage

## 2020-06-17 ENCOUNTER — Other Ambulatory Visit: Payer: Self-pay

## 2020-06-17 DIAGNOSIS — I132 Hypertensive heart and chronic kidney disease with heart failure and with stage 5 chronic kidney disease, or end stage renal disease: Secondary | ICD-10-CM | POA: Diagnosis not present

## 2020-06-17 LAB — CBC
HCT: 25.1 % — ABNORMAL LOW (ref 39.0–52.0)
Hemoglobin: 7.9 g/dL — ABNORMAL LOW (ref 13.0–17.0)
MCH: 26.8 pg (ref 26.0–34.0)
MCHC: 31.5 g/dL (ref 30.0–36.0)
MCV: 85.1 fL (ref 80.0–100.0)
Platelets: 148 10*3/uL — ABNORMAL LOW (ref 150–400)
RBC: 2.95 MIL/uL — ABNORMAL LOW (ref 4.22–5.81)
RDW: 18.2 % — ABNORMAL HIGH (ref 11.5–15.5)
WBC: 4 10*3/uL (ref 4.0–10.5)
nRBC: 0 % (ref 0.0–0.2)

## 2020-06-17 MED ORDER — HEPARIN SODIUM (PORCINE) 1000 UNIT/ML IJ SOLN
INTRAMUSCULAR | Status: AC
  Administered 2020-06-17: 1000 [IU] via INTRAVENOUS_CENTRAL
  Filled 2020-06-17: qty 5

## 2020-06-17 MED ORDER — OXYCODONE HCL 5 MG PO TABS
15.0000 mg | ORAL_TABLET | Freq: Once | ORAL | Status: AC
  Administered 2020-06-17: 15 mg via ORAL
  Filled 2020-06-17: qty 3

## 2020-06-17 NOTE — ED Notes (Signed)
EDP to the bedside to reassess pt

## 2020-06-17 NOTE — ED Notes (Signed)
Breakfast Ordered 

## 2020-06-17 NOTE — ED Provider Notes (Signed)
12:25 PM-he is alert and complains of right flank and back pain, which is typical for him, and for which he takes oxycodone regularly.  Last dose of oxycodone was at 4:34 AM.  He states he is comfortable going home.  Will give him a dose of his narcotic pain reliever prior to discharge.   Daleen Bo, MD 06/17/20 [phone]

## 2020-06-17 NOTE — ED Notes (Signed)
Patient Alert and oriented to baseline. Stable and ambulatory to baseline. Patient verbalized understanding of the discharge instructions.  Patient belongings were taken by the patient.   

## 2020-06-17 NOTE — ED Notes (Addendum)
Pt refused to have cardiac monitoring leads to be placed on him. Pt refused vital signs rechecking at this time.

## 2020-06-17 NOTE — ED Notes (Signed)
Pt refused to have any monitoring done (BP, O2 sat and heart rate). Pt states "I just want to sleep", "I cant take the beeping anymore, turn it off!".

## 2020-06-17 NOTE — Progress Notes (Signed)
Hemodialysis treatment termination per patient request. Refused to sign AMA, Veneta Penton, PA aware.

## 2020-06-17 NOTE — Progress Notes (Signed)
Pt upset throughout HD today regarding his UF goal and that he did not get his oxycodone this morning. Asked twice for it -- has been explained to him on numerous occasions in the past that his nephrology team will not write narcotic orders for him. He began getting mad, yelling and insisted to be taken off dialysis now. Will rinse him back. He got about 3hr with around 2.3L net UF.  Veneta Penton, PA-C [employer] Pager [phone]

## 2020-06-17 NOTE — ED Notes (Addendum)
This RN called to verify hemo was ready for pt's arrival, he is being prepared for transport by staff at this time. Transport arrived at bedside to bring pt upstairs within the same timing.

## 2020-06-17 NOTE — ED Notes (Signed)
Pt requesting for pain medication but refused lidocaine cream (PRN med) able to speak with provider and updated of pts request.

## 2020-06-18 IMAGING — CR DG ABDOMEN ACUTE W/ 1V CHEST
3 series · 3 of 3 positions shown · non-contrast
Comparison: Multiple prior exams. Most recent chest radiograph and
abdominal CT 01/25/2018

CLINICAL DATA: Abdominal pain, onset yesterday. Nausea and
vomiting.

EXAM:
DG ABDOMEN ACUTE W/ 1V CHEST

[chest pa]
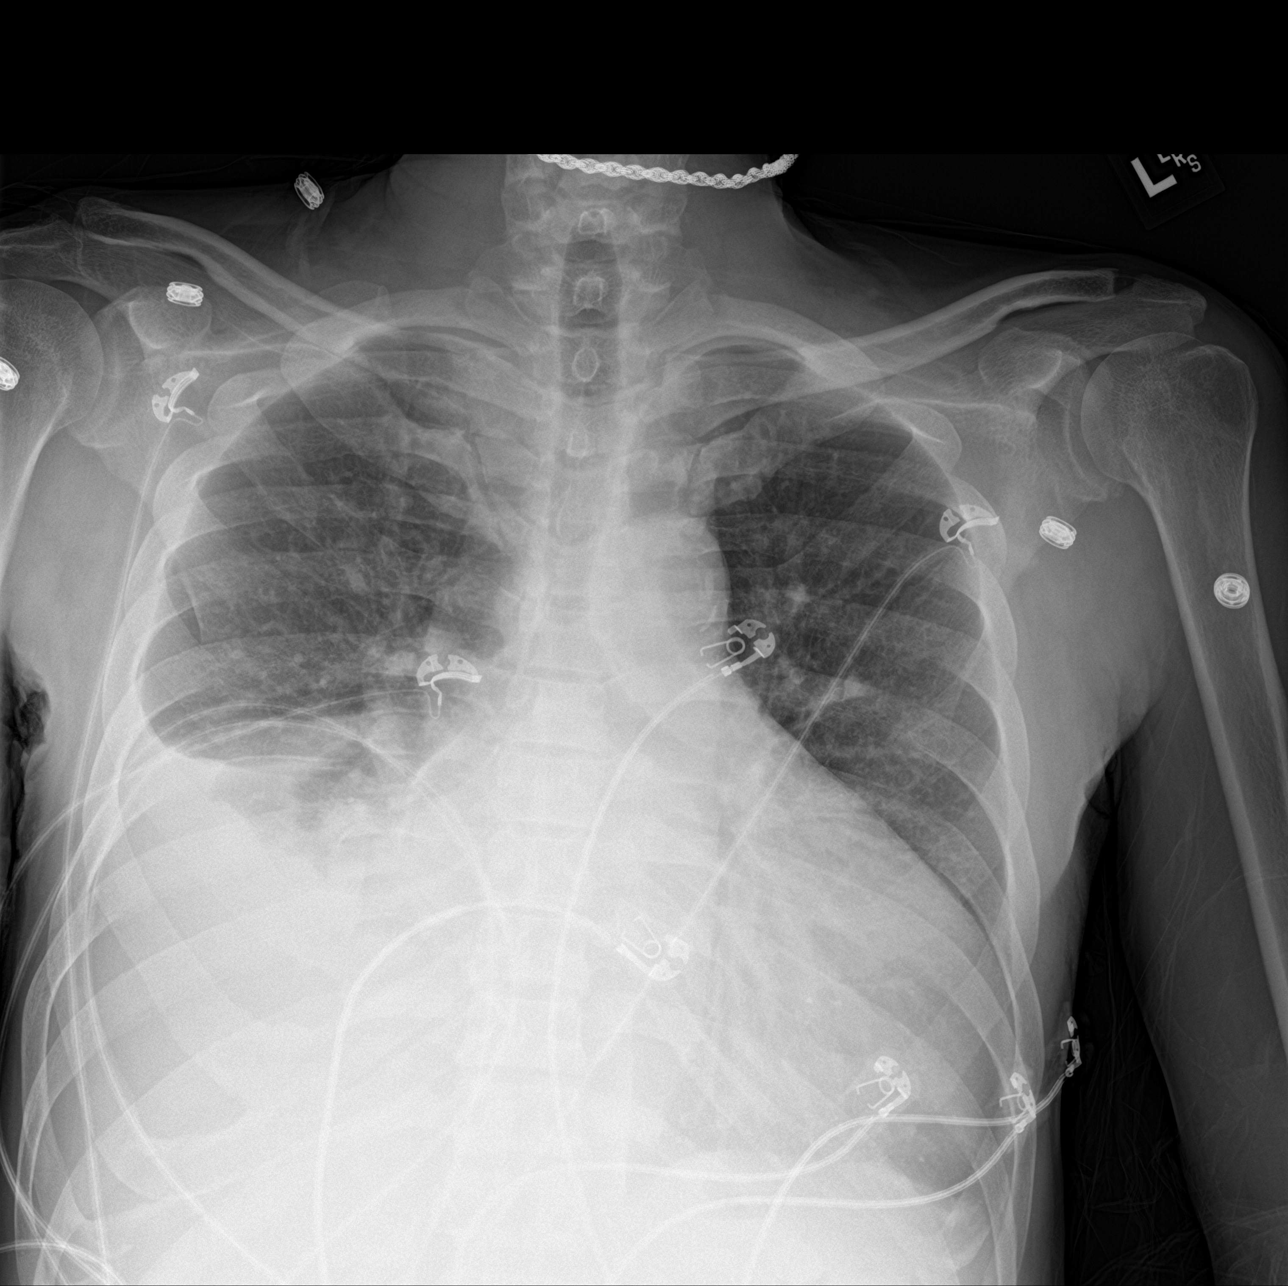

[abdomen erect]
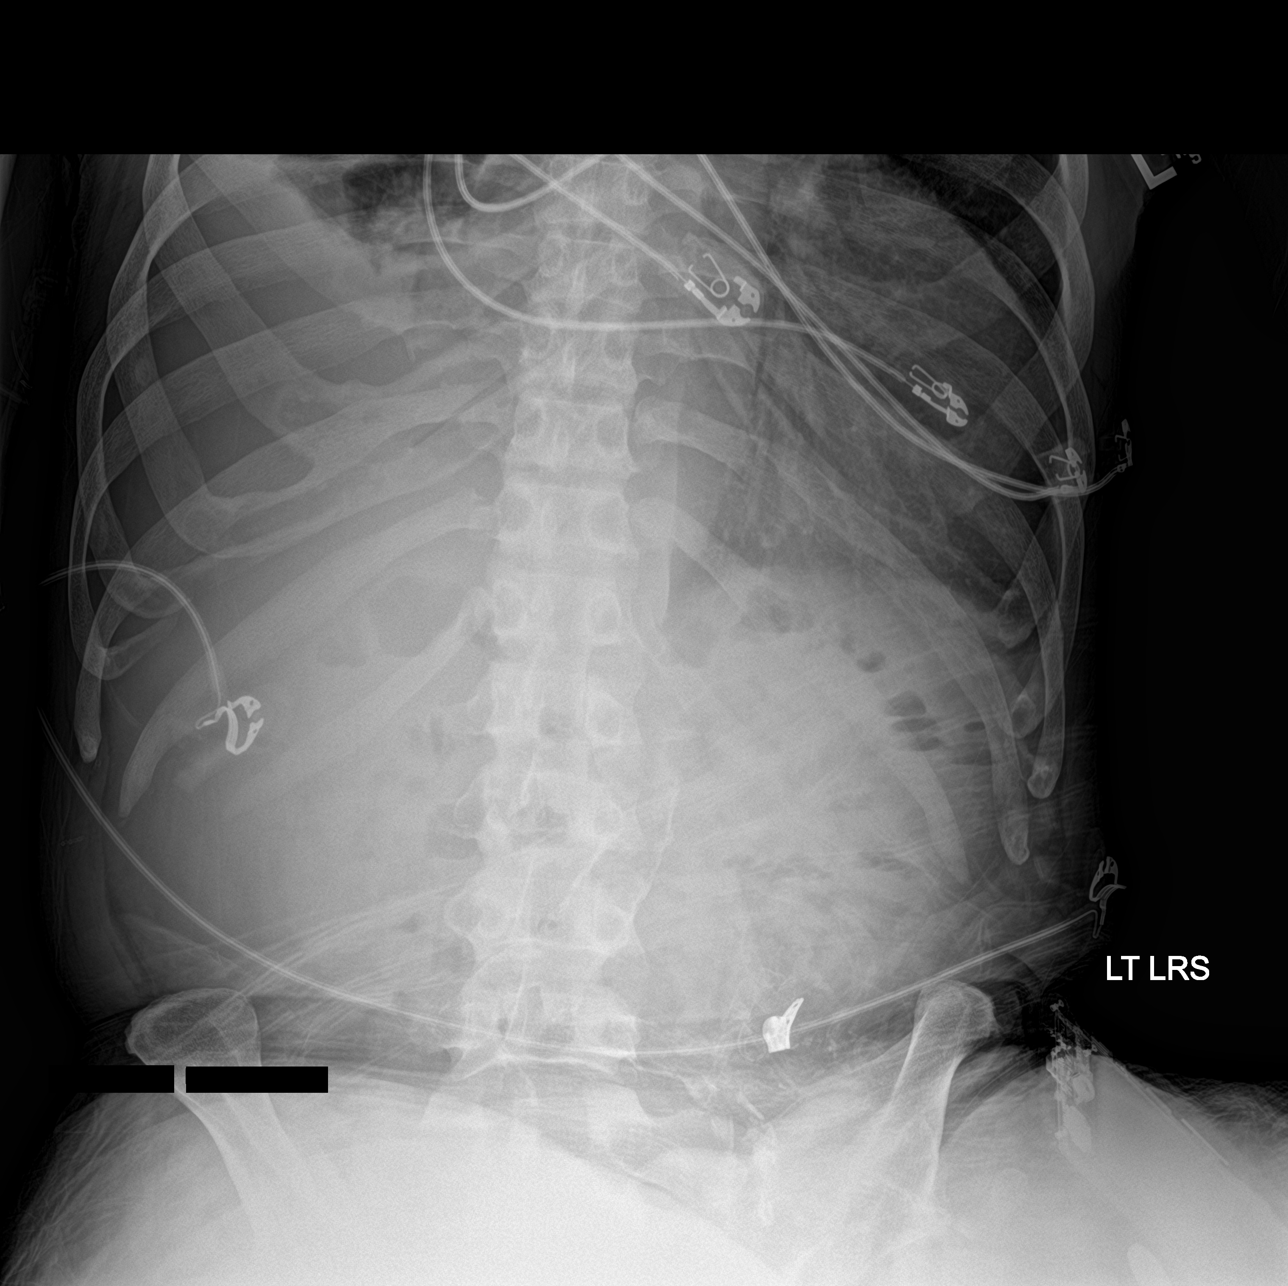

[abdomen supine]
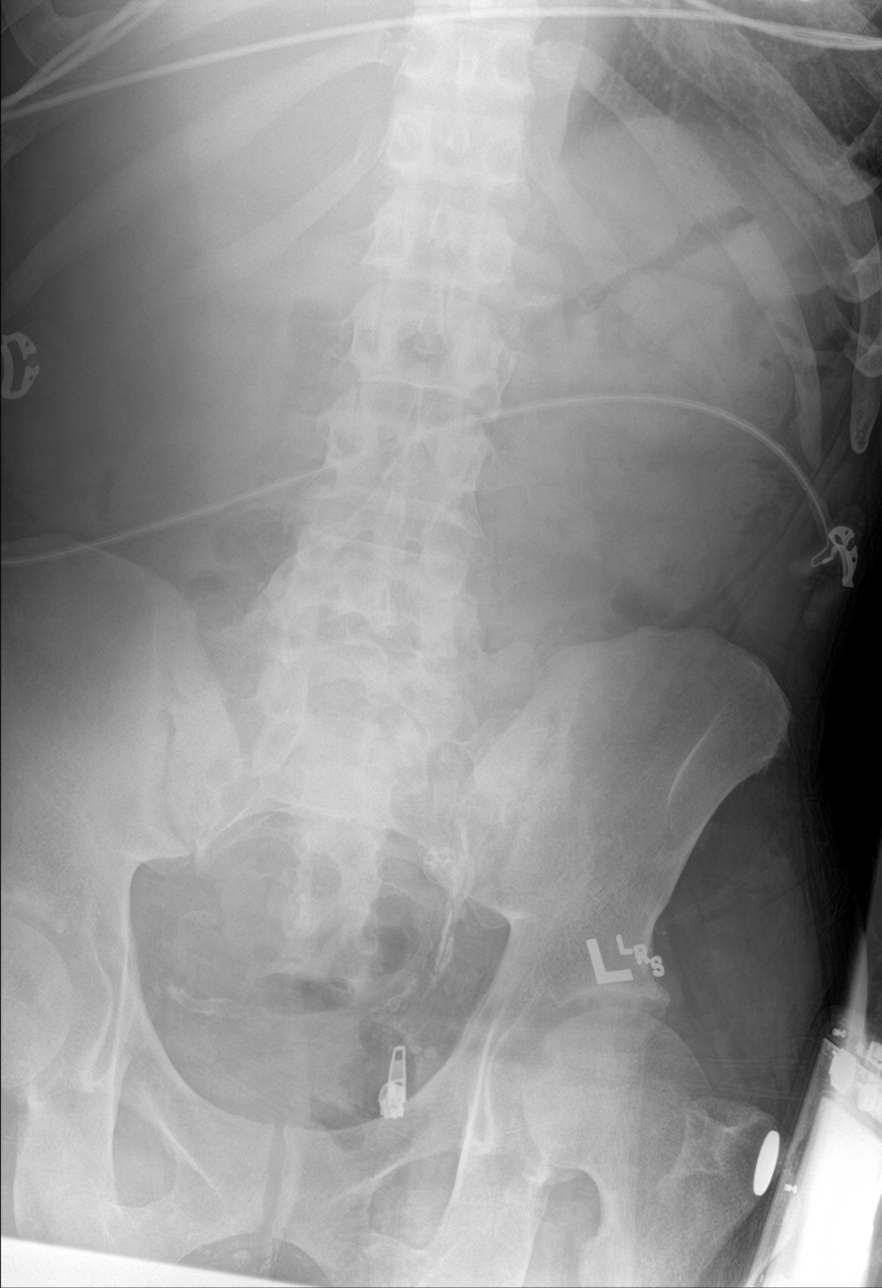

[3 of 3 positions shown; findings below may reference images not displayed]

FINDINGS: Moderate right pleural effusion with adjacent atelectasis. Unchanged
cardiomegaly. There are aortic atherosclerotic calcifications. No
pulmonary edema.

No bowel dilatation to suggest obstruction. Generalized paucity of
bowel gas in the abdomen and pelvis. Left lower quadrant
calcifications correspond failed renal transplant. Additionally
there are vascular calcifications. Unchanged osseous structures.
IMPRESSION: 1. No bowel obstruction or free air.
2. Moderate right pleural effusion with adjacent atelectasis,
unchanged from recent exams.

## 2020-06-19 ENCOUNTER — Other Ambulatory Visit: Payer: Self-pay

## 2020-06-19 ENCOUNTER — Emergency Department (HOSPITAL_COMMUNITY): Payer: Medicare Other

## 2020-06-19 ENCOUNTER — Emergency Department (HOSPITAL_COMMUNITY)
Admission: EM | Admit: 2020-06-19 | Discharge: 2020-06-19 | Disposition: A | Discharge status: Home / Self Care | Payer: Medicare Other | Attending: Emergency Medicine | Admitting: Emergency Medicine

## 2020-06-19 ENCOUNTER — Encounter (HOSPITAL_COMMUNITY): Payer: Self-pay

## 2020-06-19 ENCOUNTER — Emergency Department (HOSPITAL_COMMUNITY)
Admission: EM | Admit: 2020-06-19 | Discharge: 2020-06-20 | Disposition: A | Discharge status: Home / Self Care | Payer: Medicare Other | Source: Home / Self Care | Attending: Emergency Medicine | Admitting: Emergency Medicine

## 2020-06-19 DIAGNOSIS — N2889 Other specified disorders of kidney and ureter: Secondary | ICD-10-CM | POA: Insufficient documentation

## 2020-06-19 DIAGNOSIS — F1210 Cannabis abuse, uncomplicated: Secondary | ICD-10-CM | POA: Diagnosis not present

## 2020-06-19 DIAGNOSIS — I132 Hypertensive heart and chronic kidney disease with heart failure and with stage 5 chronic kidney disease, or end stage renal disease: Secondary | ICD-10-CM | POA: Insufficient documentation

## 2020-06-19 DIAGNOSIS — G8928 Other chronic postprocedural pain: Secondary | ICD-10-CM | POA: Insufficient documentation

## 2020-06-19 DIAGNOSIS — I5042 Chronic combined systolic (congestive) and diastolic (congestive) heart failure: Secondary | ICD-10-CM | POA: Insufficient documentation

## 2020-06-19 DIAGNOSIS — N186 End stage renal disease: Secondary | ICD-10-CM | POA: Insufficient documentation

## 2020-06-19 DIAGNOSIS — Z7901 Long term (current) use of anticoagulants: Secondary | ICD-10-CM | POA: Insufficient documentation

## 2020-06-19 DIAGNOSIS — Z992 Dependence on renal dialysis: Secondary | ICD-10-CM

## 2020-06-19 DIAGNOSIS — Z87891 Personal history of nicotine dependence: Secondary | ICD-10-CM | POA: Insufficient documentation

## 2020-06-19 DIAGNOSIS — G8929 Other chronic pain: Secondary | ICD-10-CM | POA: Insufficient documentation

## 2020-06-19 DIAGNOSIS — E1122 Type 2 diabetes mellitus with diabetic chronic kidney disease: Secondary | ICD-10-CM | POA: Insufficient documentation

## 2020-06-19 DIAGNOSIS — J441 Chronic obstructive pulmonary disease with (acute) exacerbation: Secondary | ICD-10-CM | POA: Insufficient documentation

## 2020-06-19 DIAGNOSIS — R109 Unspecified abdominal pain: Secondary | ICD-10-CM | POA: Diagnosis not present

## 2020-06-19 DIAGNOSIS — R112 Nausea with vomiting, unspecified: Secondary | ICD-10-CM | POA: Insufficient documentation

## 2020-06-19 DIAGNOSIS — Z79899 Other long term (current) drug therapy: Secondary | ICD-10-CM | POA: Insufficient documentation

## 2020-06-19 LAB — CBG MONITORING, ED
Glucose-Capillary: 86 mg/dL (ref 70–99)
Glucose-Capillary: 62 mg/dL — ABNORMAL LOW (ref 70–99)
Glucose-Capillary: 66 mg/dL — ABNORMAL LOW (ref 70–99)

## 2020-06-19 MED ORDER — OXYCODONE HCL 5 MG PO TABS
15.0000 mg | ORAL_TABLET | Freq: Once | ORAL | Status: AC
  Administered 2020-06-19: 15 mg via ORAL
  Filled 2020-06-19: qty 3

## 2020-06-19 MED ORDER — CHLORHEXIDINE GLUCONATE CLOTH 2 % EX PADS: 6.0000 | MEDICATED_PAD | Freq: Every day | CUTANEOUS | Status: DC

## 2020-06-19 MED ORDER — HYDROMORPHONE HCL 1 MG/ML IJ SOLN
0.5000 mg | Freq: Once | INTRAMUSCULAR | Status: AC
  Administered 2020-06-19: 0.5 mg via INTRAMUSCULAR
  Filled 2020-06-19: qty 1

## 2020-06-19 MED ORDER — HEPARIN SODIUM (PORCINE) 1000 UNIT/ML IJ SOLN
INTRAMUSCULAR | Status: AC
  Filled 2020-06-19: qty 4

## 2020-06-19 MED ORDER — TRIMETHOBENZAMIDE HCL 100 MG/ML IM SOLN
100.0000 mg | Freq: Once | INTRAMUSCULAR | Status: AC
  Administered 2020-06-19: 100 mg via INTRAMUSCULAR
  Filled 2020-06-19: qty 1

## 2020-06-19 MED ORDER — ONDANSETRON 4 MG PO TBDP
4.0000 mg | ORAL_TABLET | Freq: Once | ORAL | Status: AC
  Administered 2020-06-19: 4 mg via ORAL
  Filled 2020-06-19: qty 1

## 2020-06-19 MED ORDER — PROCHLORPERAZINE EDISYLATE 10 MG/2ML IJ SOLN
10.0000 mg | Freq: Once | INTRAMUSCULAR | Status: AC
  Administered 2020-06-19: 10 mg via INTRAMUSCULAR
  Filled 2020-06-19: qty 2

## 2020-06-19 MED ORDER — PROMETHAZINE HCL 25 MG/ML IJ SOLN: 12.5000 mg | Freq: Once | INTRAMUSCULAR | Status: DC

## 2020-06-19 NOTE — ED Notes (Signed)
Dinner Tray Ordered @ [phone].

## 2020-06-19 NOTE — ED Notes (Signed)
Pt back to the ED on stretcher via transport. Pt states, "don't take me in a room, my ride is here and I am leaving." Encouraged pt to stay to get VS and see provider. Pt states he is not staying, he is leaving. Pt demanding rail to be let down in middle of hallway. Pt states he is not staying for anything, pt ambulatory to lobby. NAD noted.

## 2020-06-19 NOTE — ED Triage Notes (Signed)
Pt is a poor historian. Pt states that he had dialysis Saturday morning and they took too much off of him too quickly. He states that ever since then he has been vomiting and had RLQ abdominal pain.

## 2020-06-19 NOTE — ED Notes (Signed)
C/o nausea

## 2020-06-19 NOTE — ED Triage Notes (Signed)
Pt requesting dialysis, last treatment Friday.
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
## 2020-06-19 NOTE — Discharge Planning (Signed)
RNCM confirmed with Renal Navigator that pt DOES NOT have a permanent dialysis center and will continue to return to be dialyzed until he is accepted alternate HD Center.

## 2020-06-19 NOTE — ED Notes (Signed)
Graham crackers and peanut butter given

## 2020-06-19 NOTE — ED Notes (Signed)
Lunch Tray Ordered @ 1027. °

## 2020-06-19 NOTE — ED Notes (Signed)
Lunch tray given
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
## 2020-06-20 DIAGNOSIS — R109 Unspecified abdominal pain: Secondary | ICD-10-CM | POA: Diagnosis not present

## 2020-06-20 LAB — COMPREHENSIVE METABOLIC PANEL
ALT: 15 U/L (ref 0–44)
AST: 27 U/L (ref 15–41)
Albumin: 3.4 g/dL — ABNORMAL LOW (ref 3.5–5.0)
Alkaline Phosphatase: 229 U/L — ABNORMAL HIGH (ref 38–126)
Anion gap: 16 — ABNORMAL HIGH (ref 5–15)
BUN: 69 mg/dL — ABNORMAL HIGH (ref 6–20)
CO2: 24 mmol/L (ref 22–32)
Calcium: 8.6 mg/dL — ABNORMAL LOW (ref 8.9–10.3)
Chloride: 97 mmol/L — ABNORMAL LOW (ref 98–111)
Creatinine, Ser: 10.81 mg/dL — ABNORMAL HIGH (ref 0.61–1.24)
GFR, Estimated: 5 mL/min — ABNORMAL LOW (ref >60)
Glucose, Bld: 120 mg/dL — ABNORMAL HIGH (ref 70–99)
Potassium: 5.5 mmol/L — ABNORMAL HIGH (ref 3.5–5.1)
Sodium: 137 mmol/L (ref 135–145)
Total Bilirubin: 0.8 mg/dL (ref 0.3–1.2)
Total Protein: 8.2 g/dL — ABNORMAL HIGH (ref 6.5–8.1)

## 2020-06-20 LAB — CBC
HCT: 27.7 % — ABNORMAL LOW (ref 39.0–52.0)
Hemoglobin: 8.4 g/dL — ABNORMAL LOW (ref 13.0–17.0)
MCH: 26.3 pg (ref 26.0–34.0)
MCHC: 30.3 g/dL (ref 30.0–36.0)
MCV: 86.6 fL (ref 80.0–100.0)
Platelets: 154 10*3/uL (ref 150–400)
RBC: 3.2 MIL/uL — ABNORMAL LOW (ref 4.22–5.81)
RDW: 18.6 % — ABNORMAL HIGH (ref 11.5–15.5)
WBC: 4.9 10*3/uL (ref 4.0–10.5)
nRBC: 0 % (ref 0.0–0.2)

## 2020-06-21 IMAGING — CT CT CHEST W/O CM
2 of 4 series · 13 of 46 positions shown, 15 images · non-contrast
Comparison: Chest and abdominal x-rays from same day. CT abdomen
pelvis dated January 25, 2018. CT chest dated November 09, 2017.

CLINICAL DATA: Nausea and vomiting.

EXAM:
CT CHEST, ABDOMEN AND PELVIS WITHOUT CONTRAST
TECHNIQUE: Multidetector CT imaging of the chest, abdomen and pelvis was
performed following the standard protocol without IV contrast.

[Series 3: cap without · axial · non-contrast · 0.67mm/px · z∈[+918,+1483]mm · 10 of 135 slices shown, 12 images]
[im 11/135  soft-tissue]
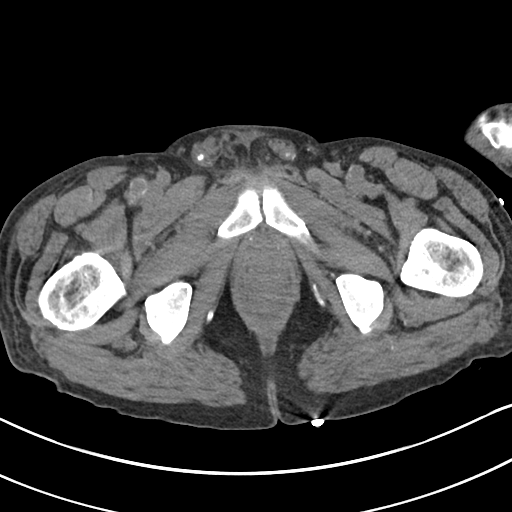
[im 11/135  bone]
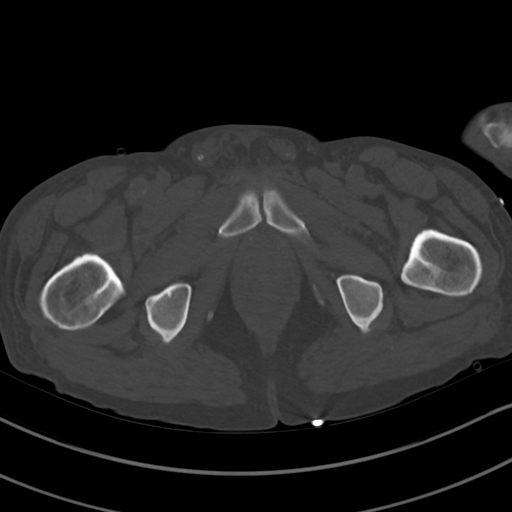
[im 21/135  soft-tissue]
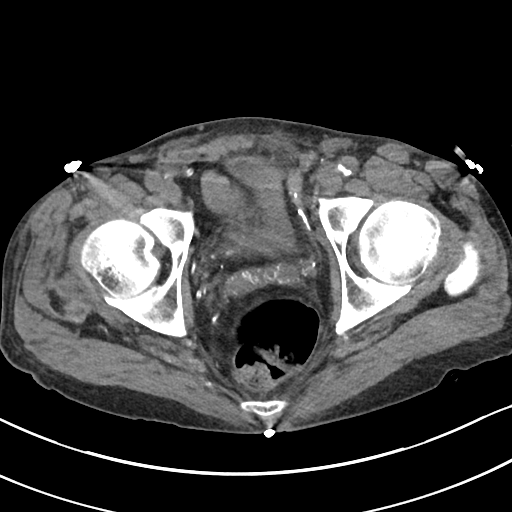
[im 42/135  soft-tissue]
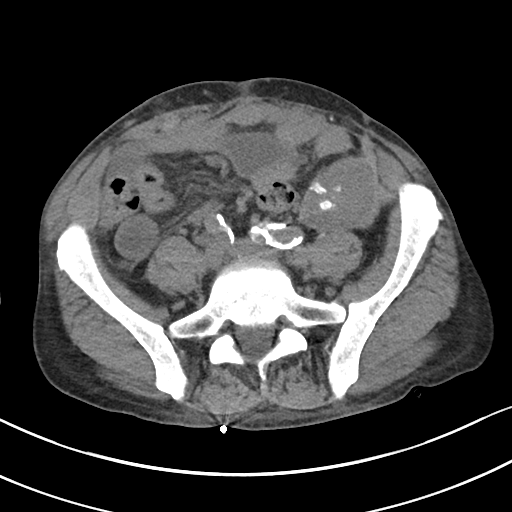
[im 52/135  soft-tissue]
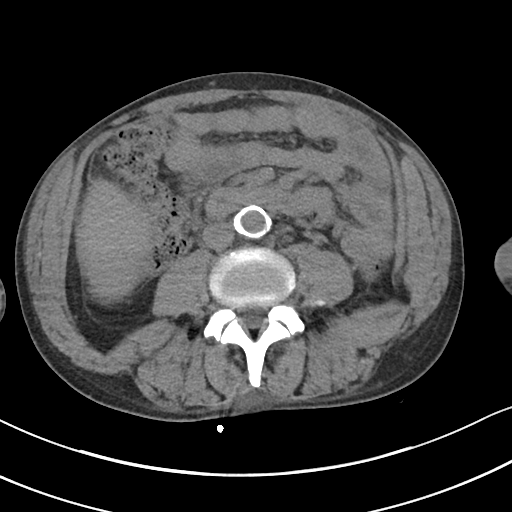
[im 62/135  soft-tissue]
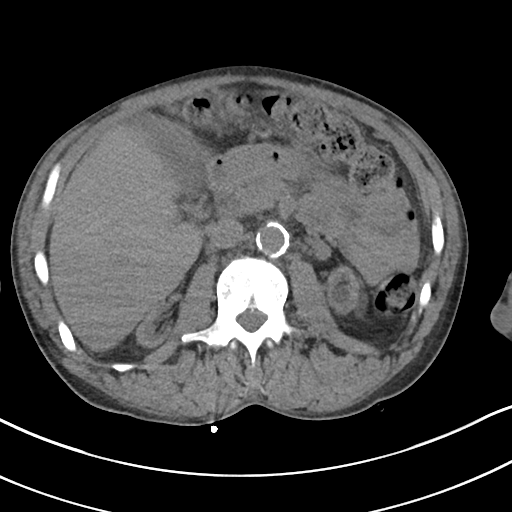
[im 73/135  soft-tissue]
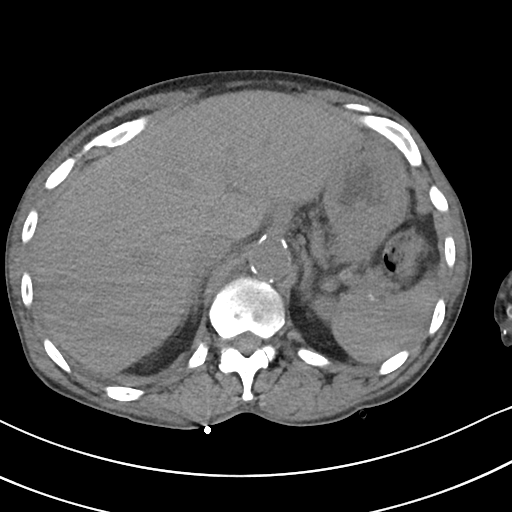
[im 83/135  soft-tissue]
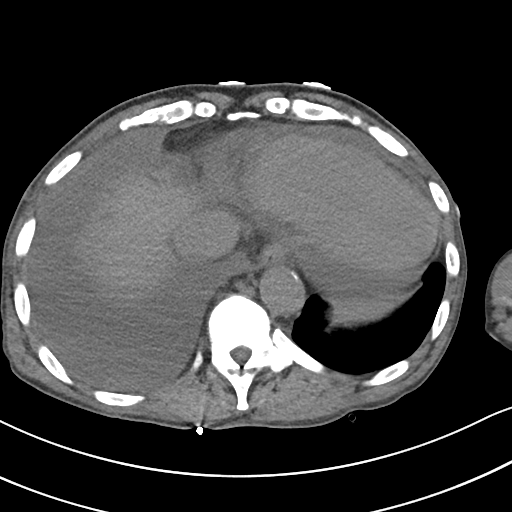
[im 104/135  soft-tissue]
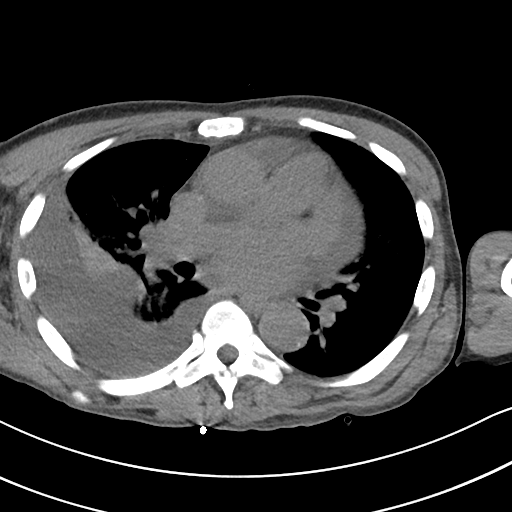
[im 114/135  soft-tissue]
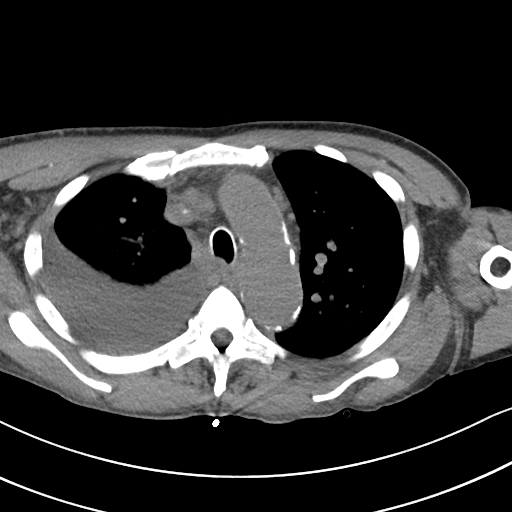
[im 114/135  bone]
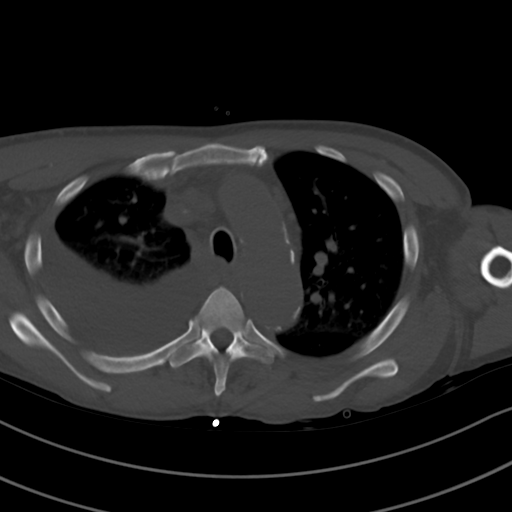
[im 124/135  soft-tissue]
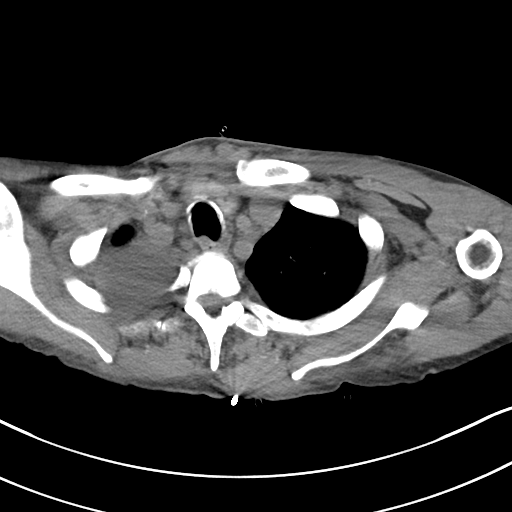

[Series 6: cor · coronal · 0.74mm/px · 3 of 81 slices shown]
[im 27/81  soft-tissue]
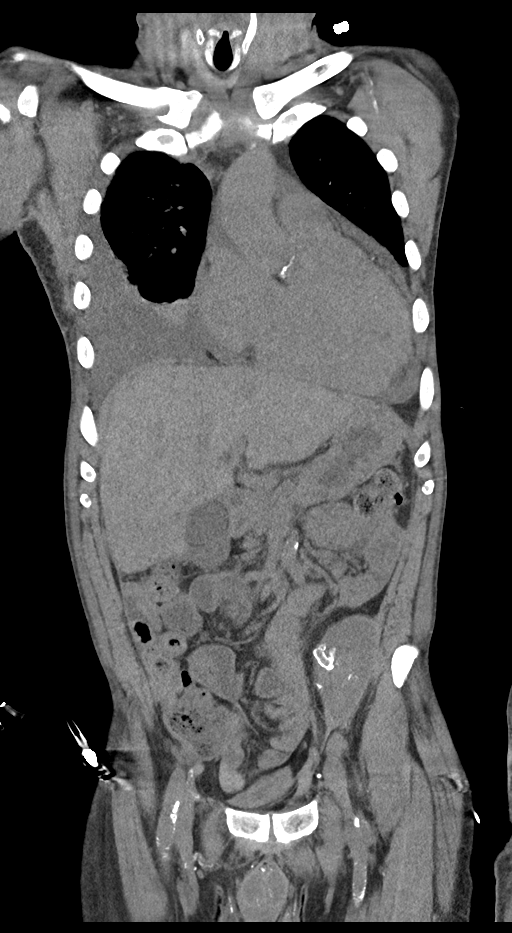
[im 36/81  soft-tissue]
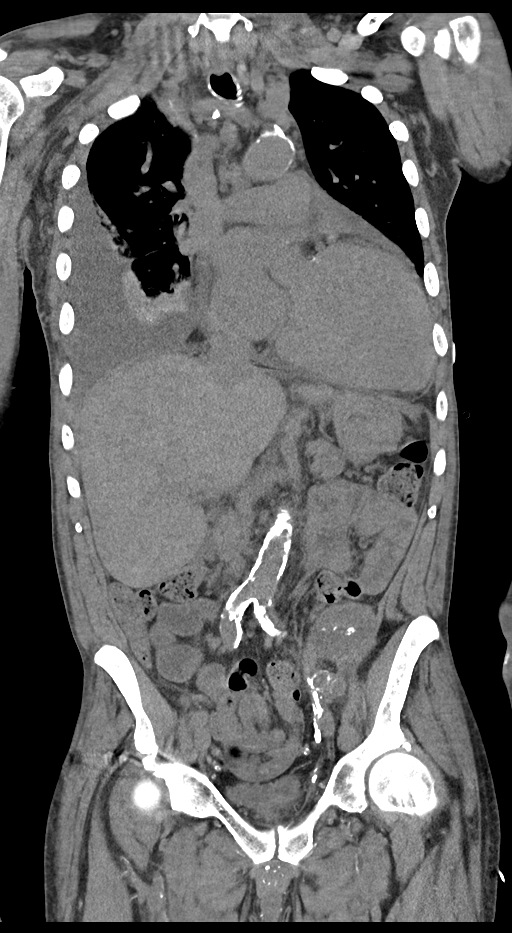
[im 45/81  soft-tissue]
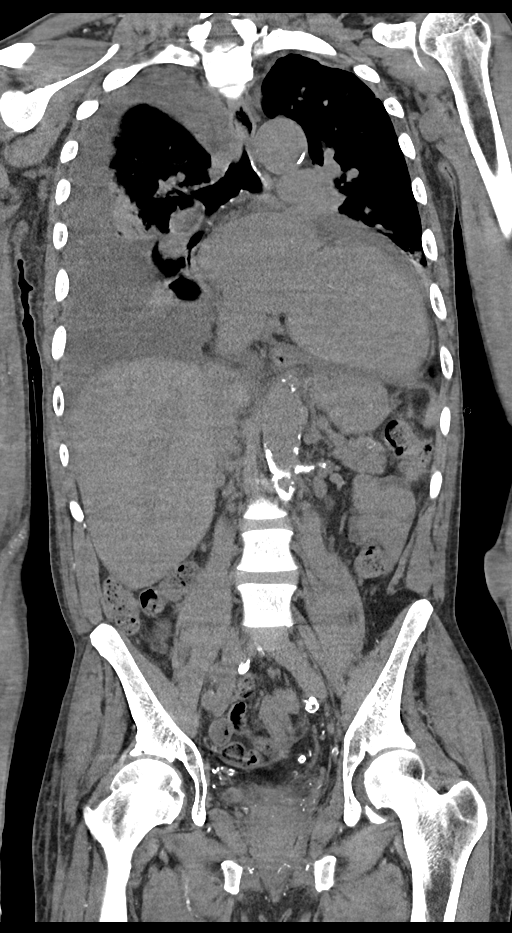

[13 of 46 positions shown; findings below may reference images not displayed]

FINDINGS: CT CHEST FINDINGS

Cardiovascular: Unchanged moderate cardiomegaly and small
pericardial effusion. Normal caliber thoracic aorta. Coronary,
aortic arch, and branch vessel atherosclerotic vascular disease.
Unchanged mildly enlarged main pulmonary artery measuring up to
cm.

Mediastinum/Nodes: Mildly enlarged mediastinal lymph nodes appears
stable to slightly decreased in size. No axillary lymphadenopathy.
The thyroid gland, trachea, and esophagus demonstrate no significant
findings.

Lungs/Pleura: Large right pleural effusion. Faint layering
ground-glass density in both upper lobes likely reflects mild
pulmonary edema. Increased atelectasis in the right upper, middle,
and lower lobes. Mild peribronchial thickening. No pneumothorax. No
suspicious pulmonary nodule.

Musculoskeletal: Diffuse sclerosis, consistent with renal
osteodystrophy. No acute or significant osseous findings. Old
fracture deformity of the right proximal clavicle.

CT ABDOMEN PELVIS FINDINGS

Hepatobiliary: No focal liver abnormality is seen. No gallstones,
gallbladder wall thickening, or biliary dilatation.

Pancreas: Unremarkable. No pancreatic ductal dilatation or
surrounding inflammatory changes.

Spleen: Normal in size without focal abnormality.

Adrenals/Urinary Tract: The adrenal glands are unremarkable.
Bilateral renal atrophy again noted. Unchanged 3.3 cm thick-walled
complex lesion arising from the left kidney. Unchanged small
bilateral simple cysts. No renal or ureteral calculi. No
hydronephrosis. Unchanged calcified failed renal transplant in the
left lower quadrant. The bladder is unremarkable for the degree of
distention.

Stomach/Bowel: Stomach is within normal limits. Appendix not
definitively visualized, however there are no signs of inflammation
at the base of the cecum. No evidence of bowel wall thickening,
distention, or inflammatory changes.

Vascular/Lymphatic: Aortic atherosclerosis. No enlarged abdominal or
pelvic lymph nodes.

Reproductive: Prostate is unremarkable.

Other: No free fluid or pneumoperitoneum.

Musculoskeletal: Diffuse sclerosis. No acute or significant osseous
findings.
IMPRESSION: Chest:

1. Large right pleural effusion with compressive right lung
atelectasis, greatest in the right lower lobe.
2. Unchanged moderate cardiomegaly and small pericardial effusion.
Faint layering ground-glass density in both upper lobes likely
reflects mild pulmonary edema.
3. Unchanged mildly enlarged main pulmonary artery, consistent with
pulmonary arterial hypertension.
4. Stable to slight interval decrease in size of mildly enlarged
mediastinal lymph nodes, favored reactive.
5.  Aortic atherosclerosis ([id number]).

Abdomen and pelvis:

1.  No acute intra-abdominal process.
2. Unchanged 3.3 cm indeterminate left renal mass.

## 2020-06-21 IMAGING — CR DG ABDOMEN ACUTE W/ 1V CHEST
3 series · 3 of 3 positions shown · non-contrast
Comparison: 01/28/2018

CLINICAL DATA: Vomiting.

EXAM:
DG ABDOMEN ACUTE W/ 1V CHEST

[chest pa]
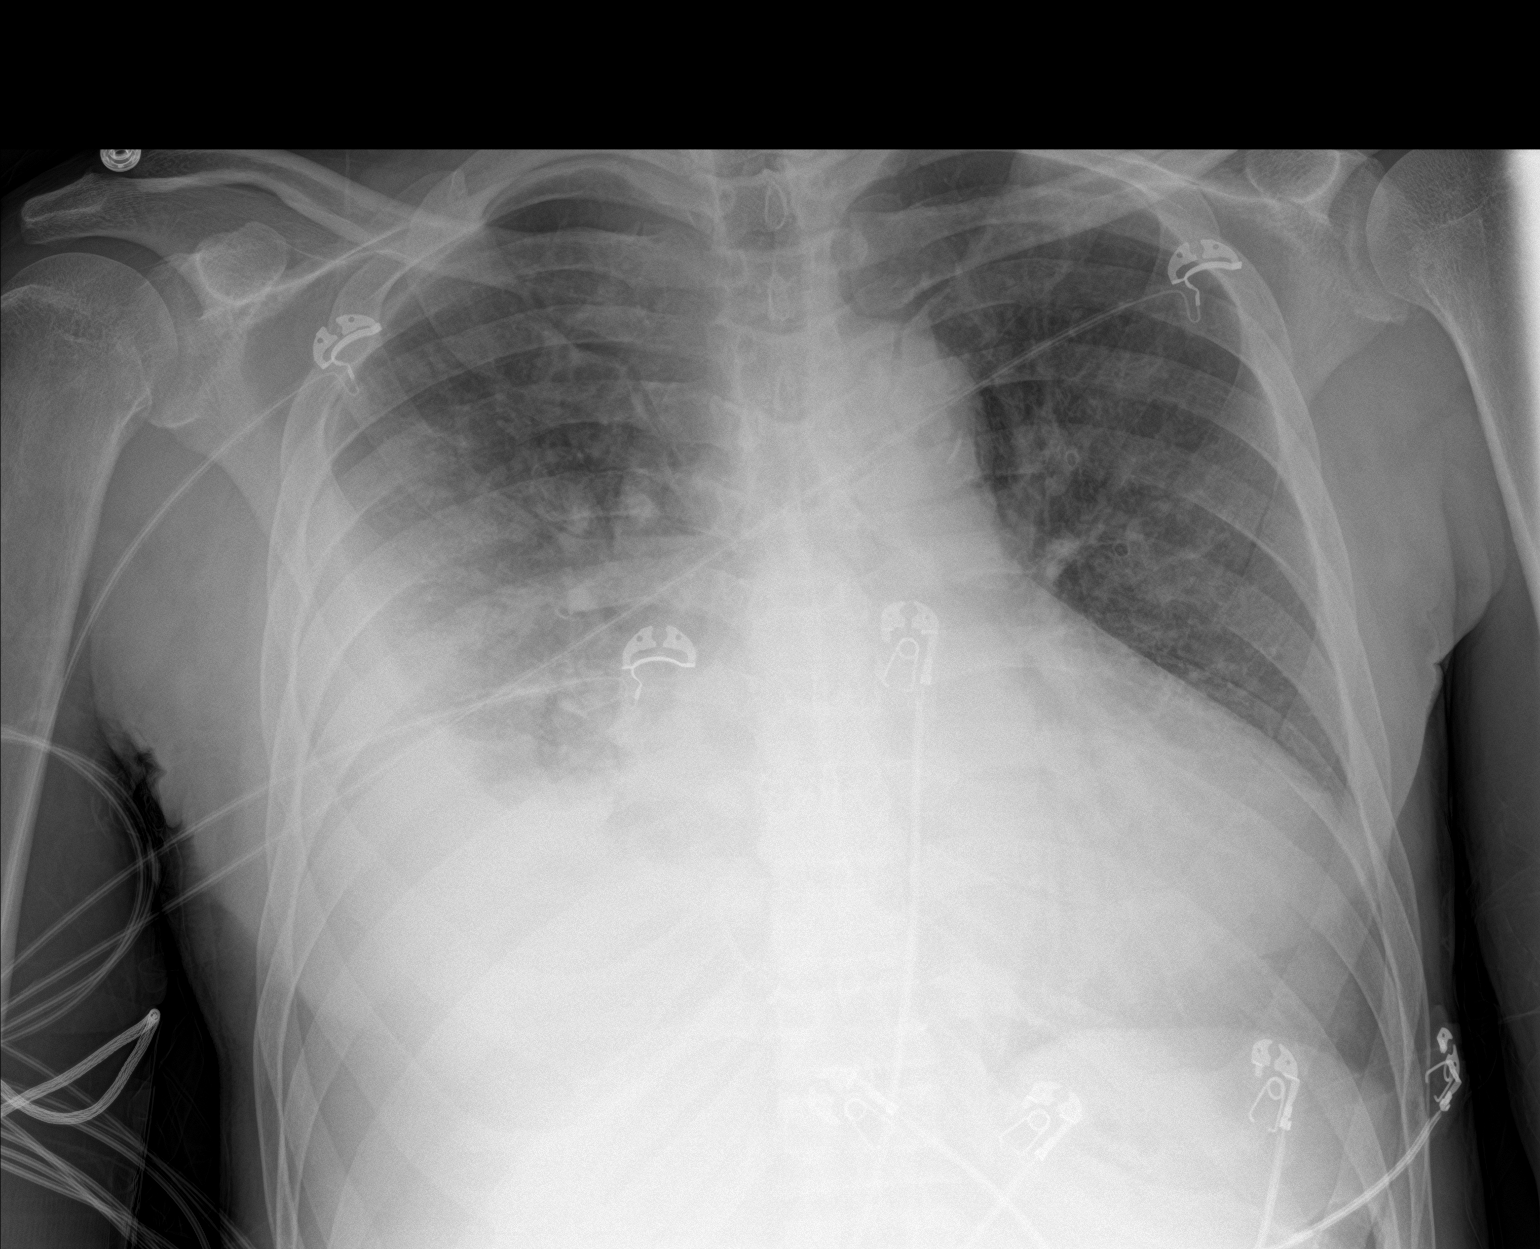

[abdomen erect]
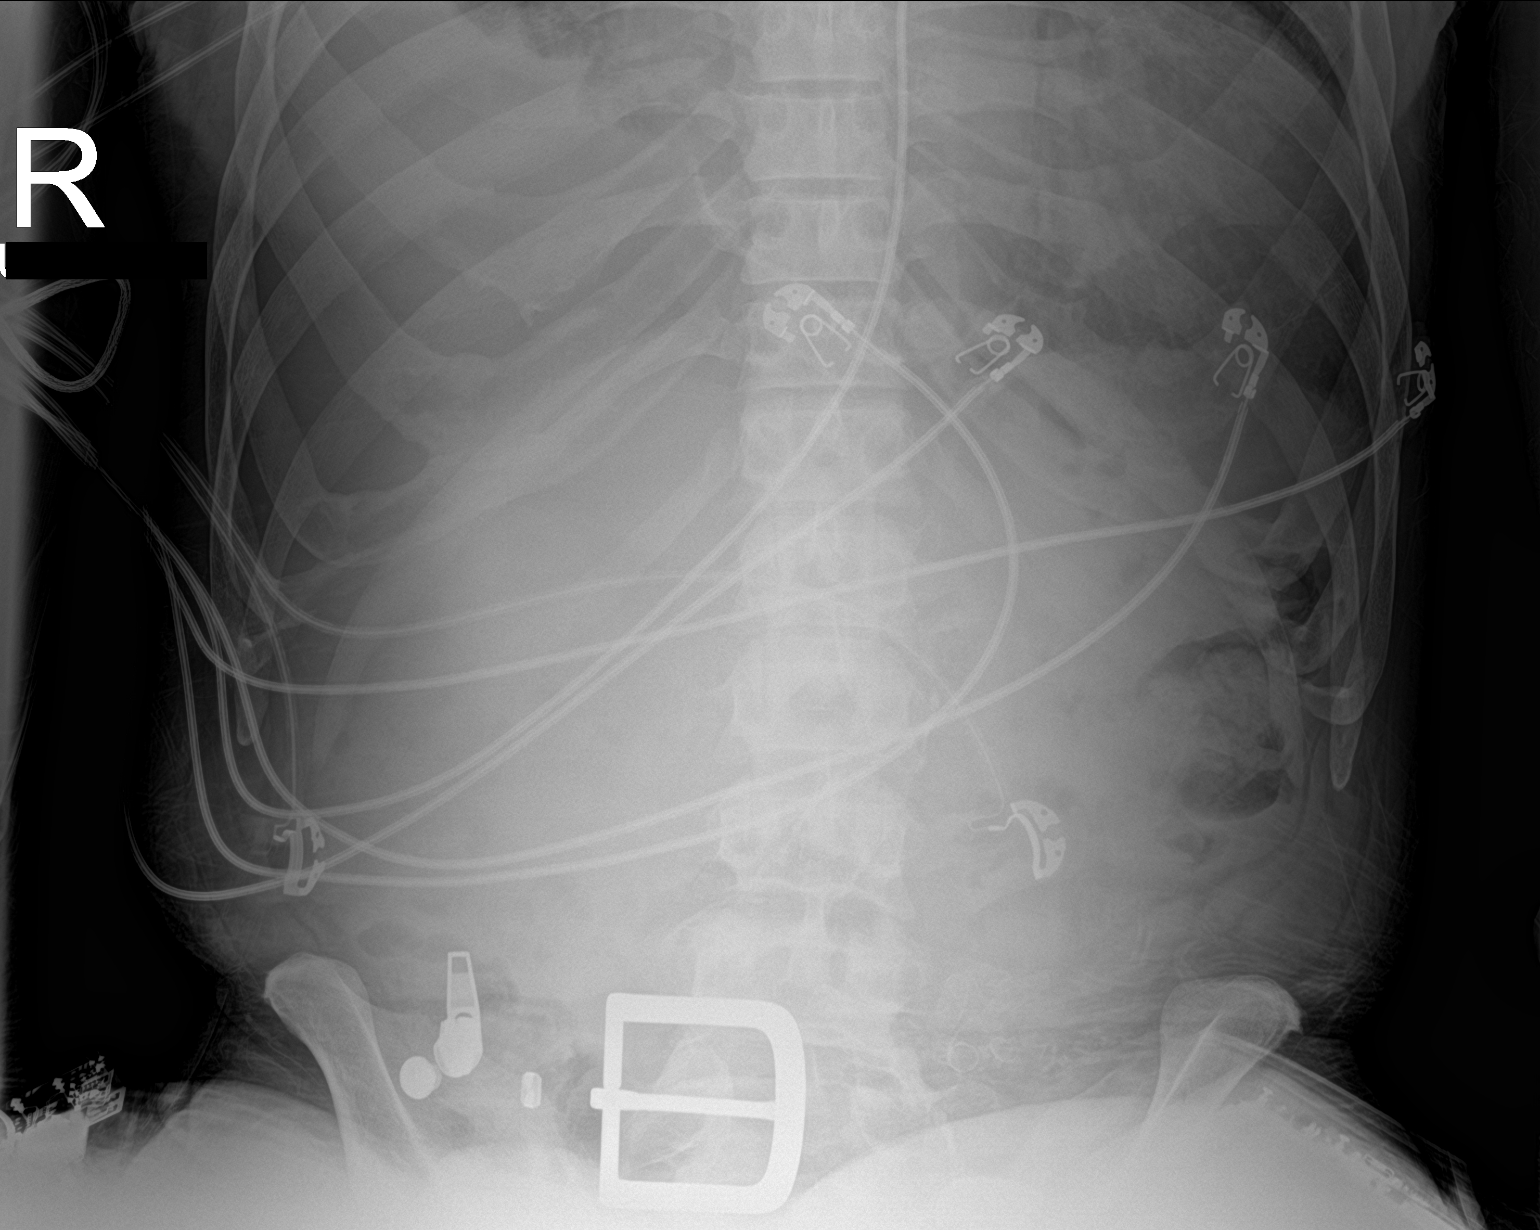

[abdomen supine]
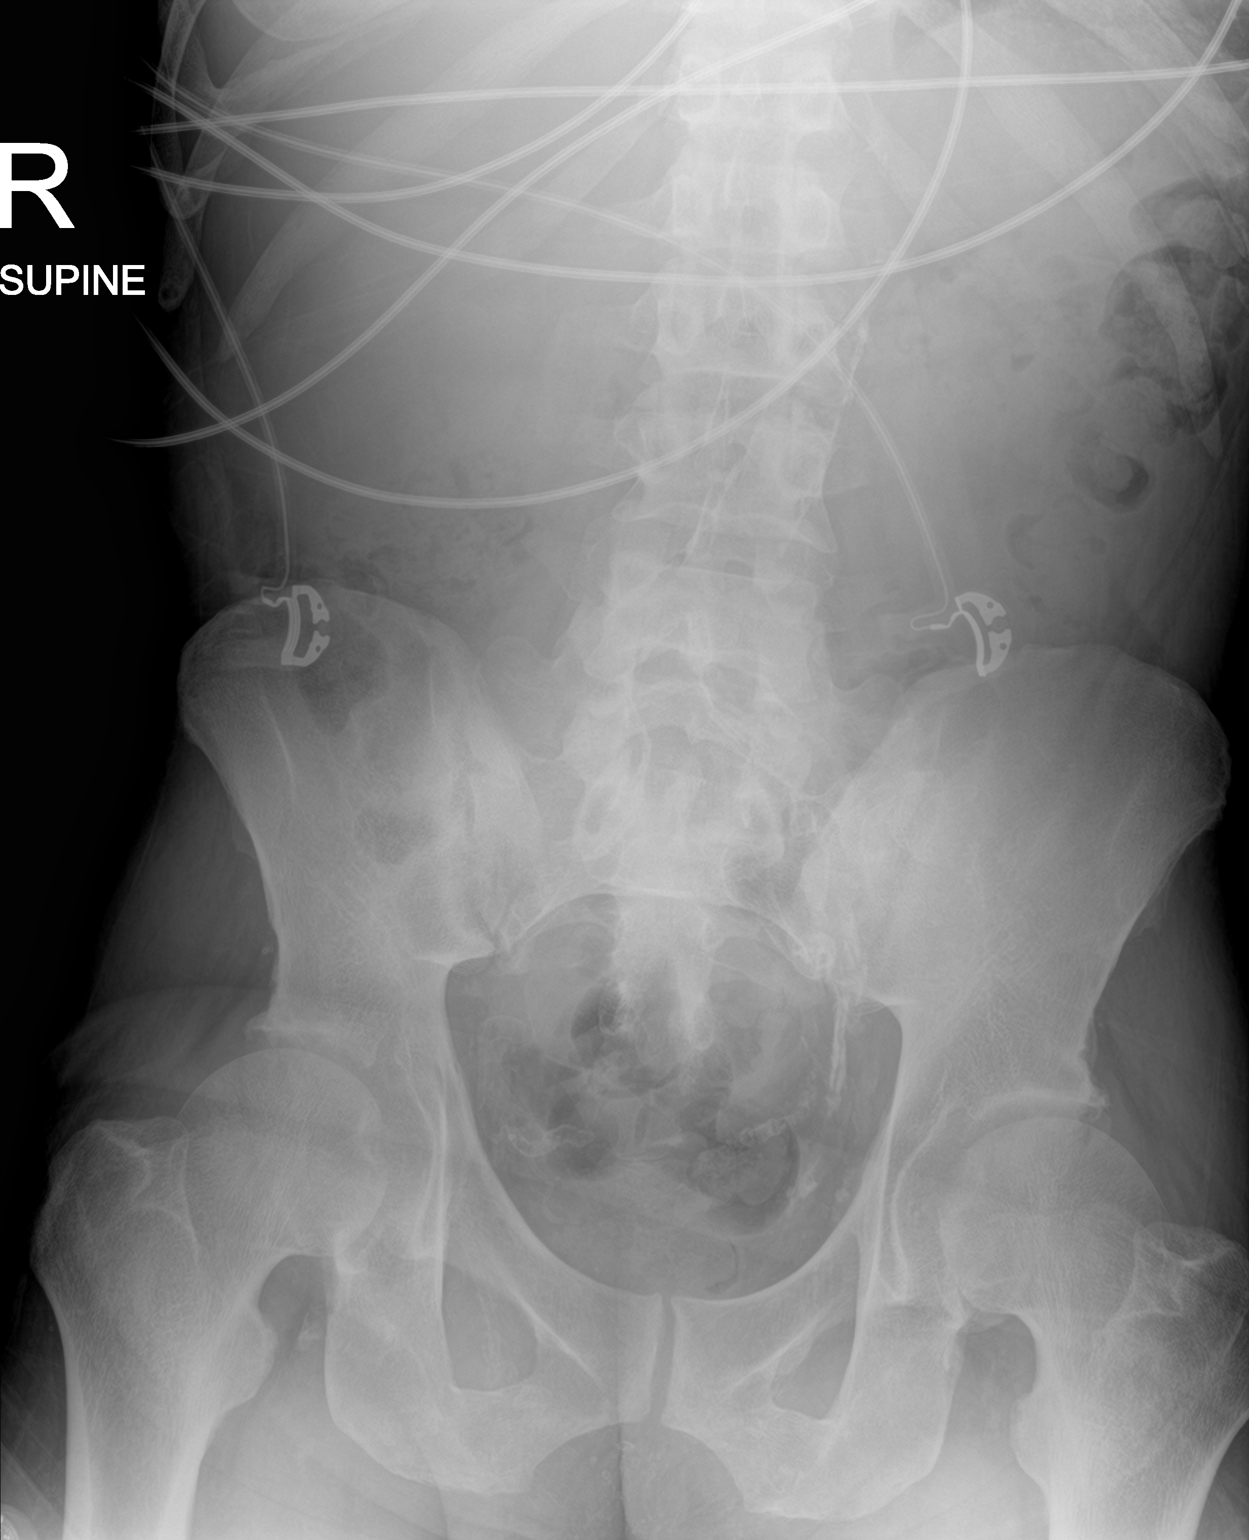

[3 of 3 positions shown; findings below may reference images not displayed]

FINDINGS: Chest x-ray shows some increase in moderate right pleural effusion
with associated atelectasis and consolidation of the right lower
lung. Component of pneumonia cannot be excluded. Mild interstitial
edema appears similar to the prior study.

Abdominal films show no bowel obstruction or ileus. No free air
identified. No abnormal calcifications. Bony structures are
unremarkable.
IMPRESSION: Increase in volume of moderate right pleural effusion with
associated atelectasis and consolidation of the right lower lung.
Underlying pneumonia cannot be excluded. Mild pulmonary interstitial
edema appears similar to the prior study and is likely chronic.

## 2020-06-22 IMAGING — DX DG CHEST 2V
2 series · 2 of 2 positions shown · non-contrast
Comparison: Most recent prior chest x-ray 01/31/2018

CLINICAL DATA: 51-year-old male with recurrent right-sided pleural
effusion now status post repeat right thoracentesis.

EXAM:
CHEST - 2 VIEW

[chest pa]
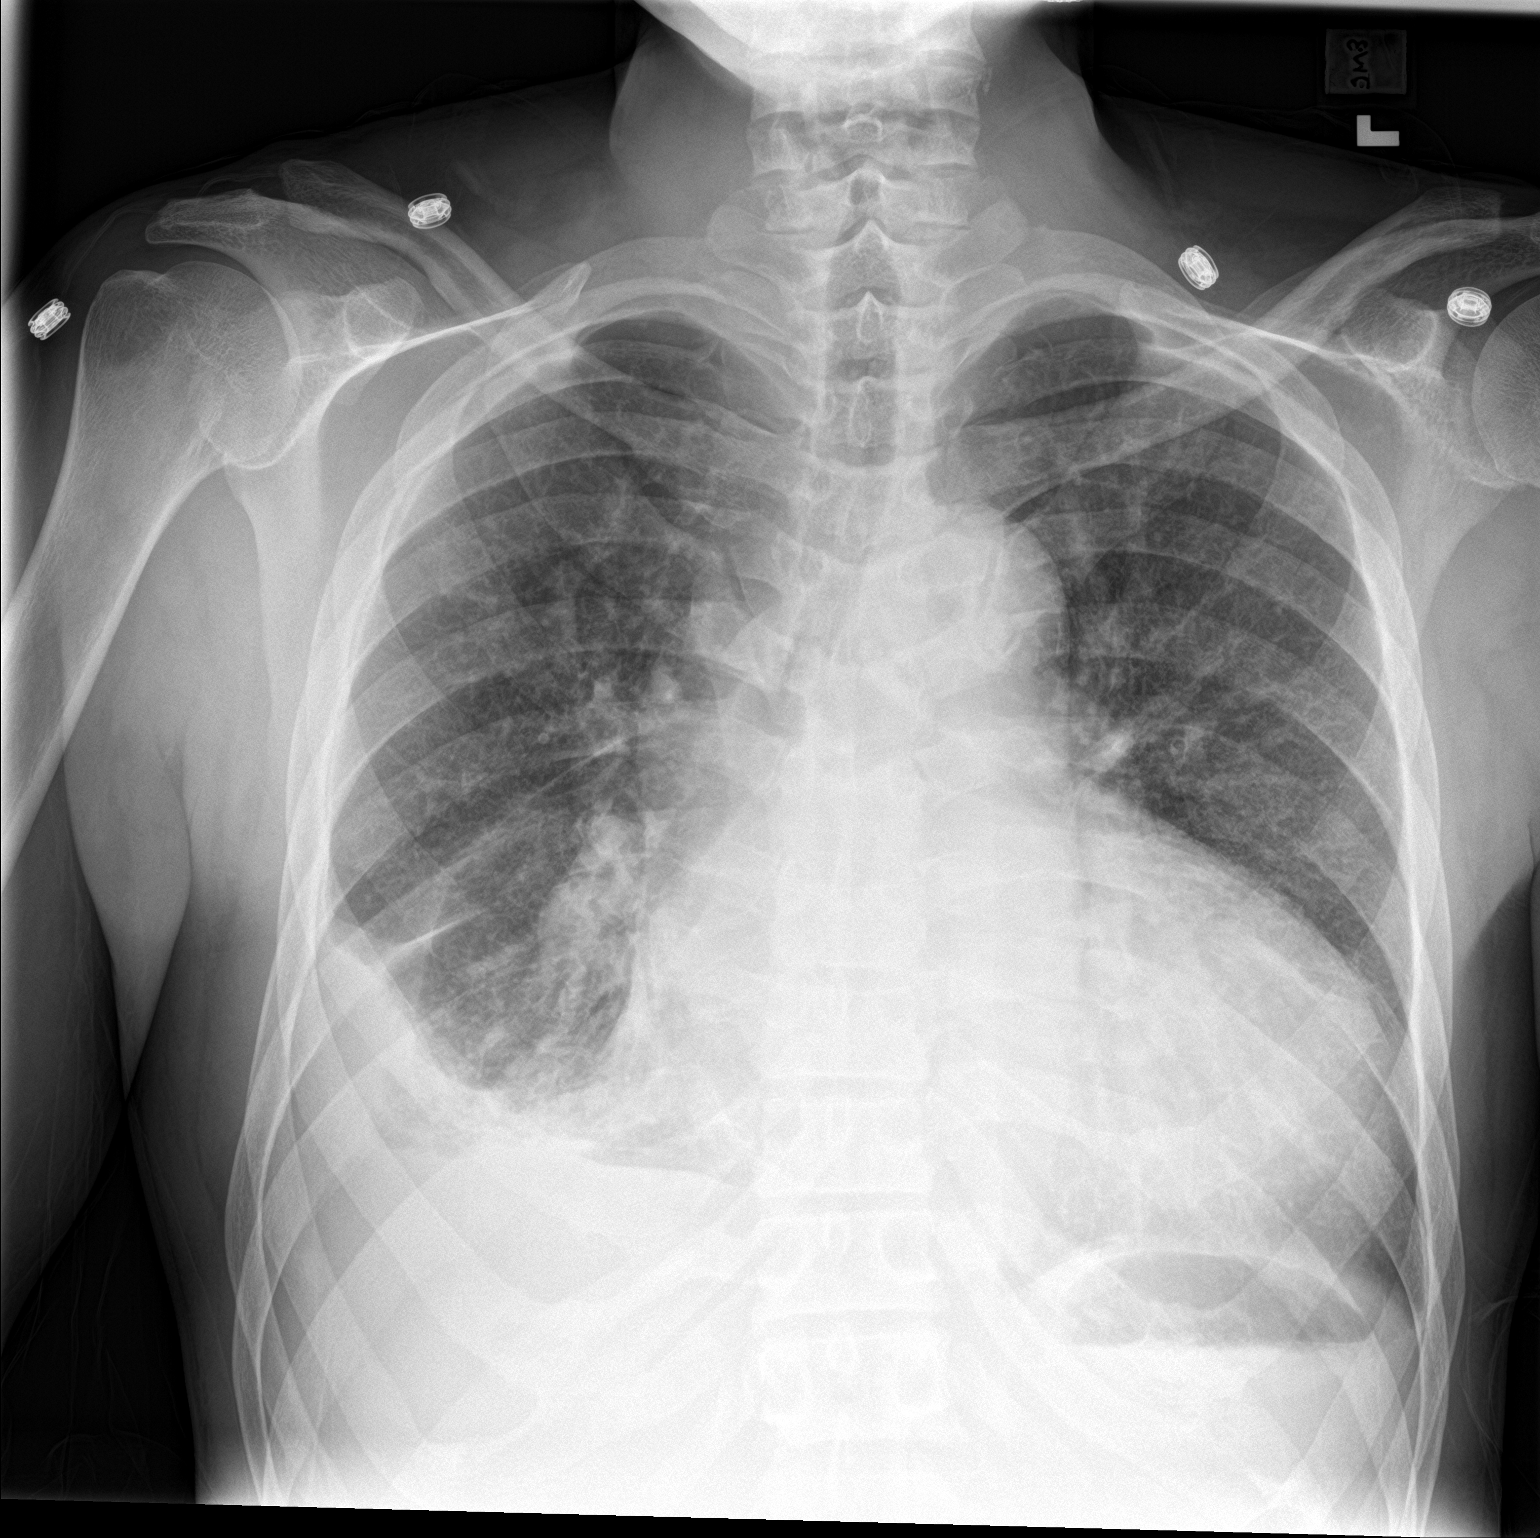

[chest lat]
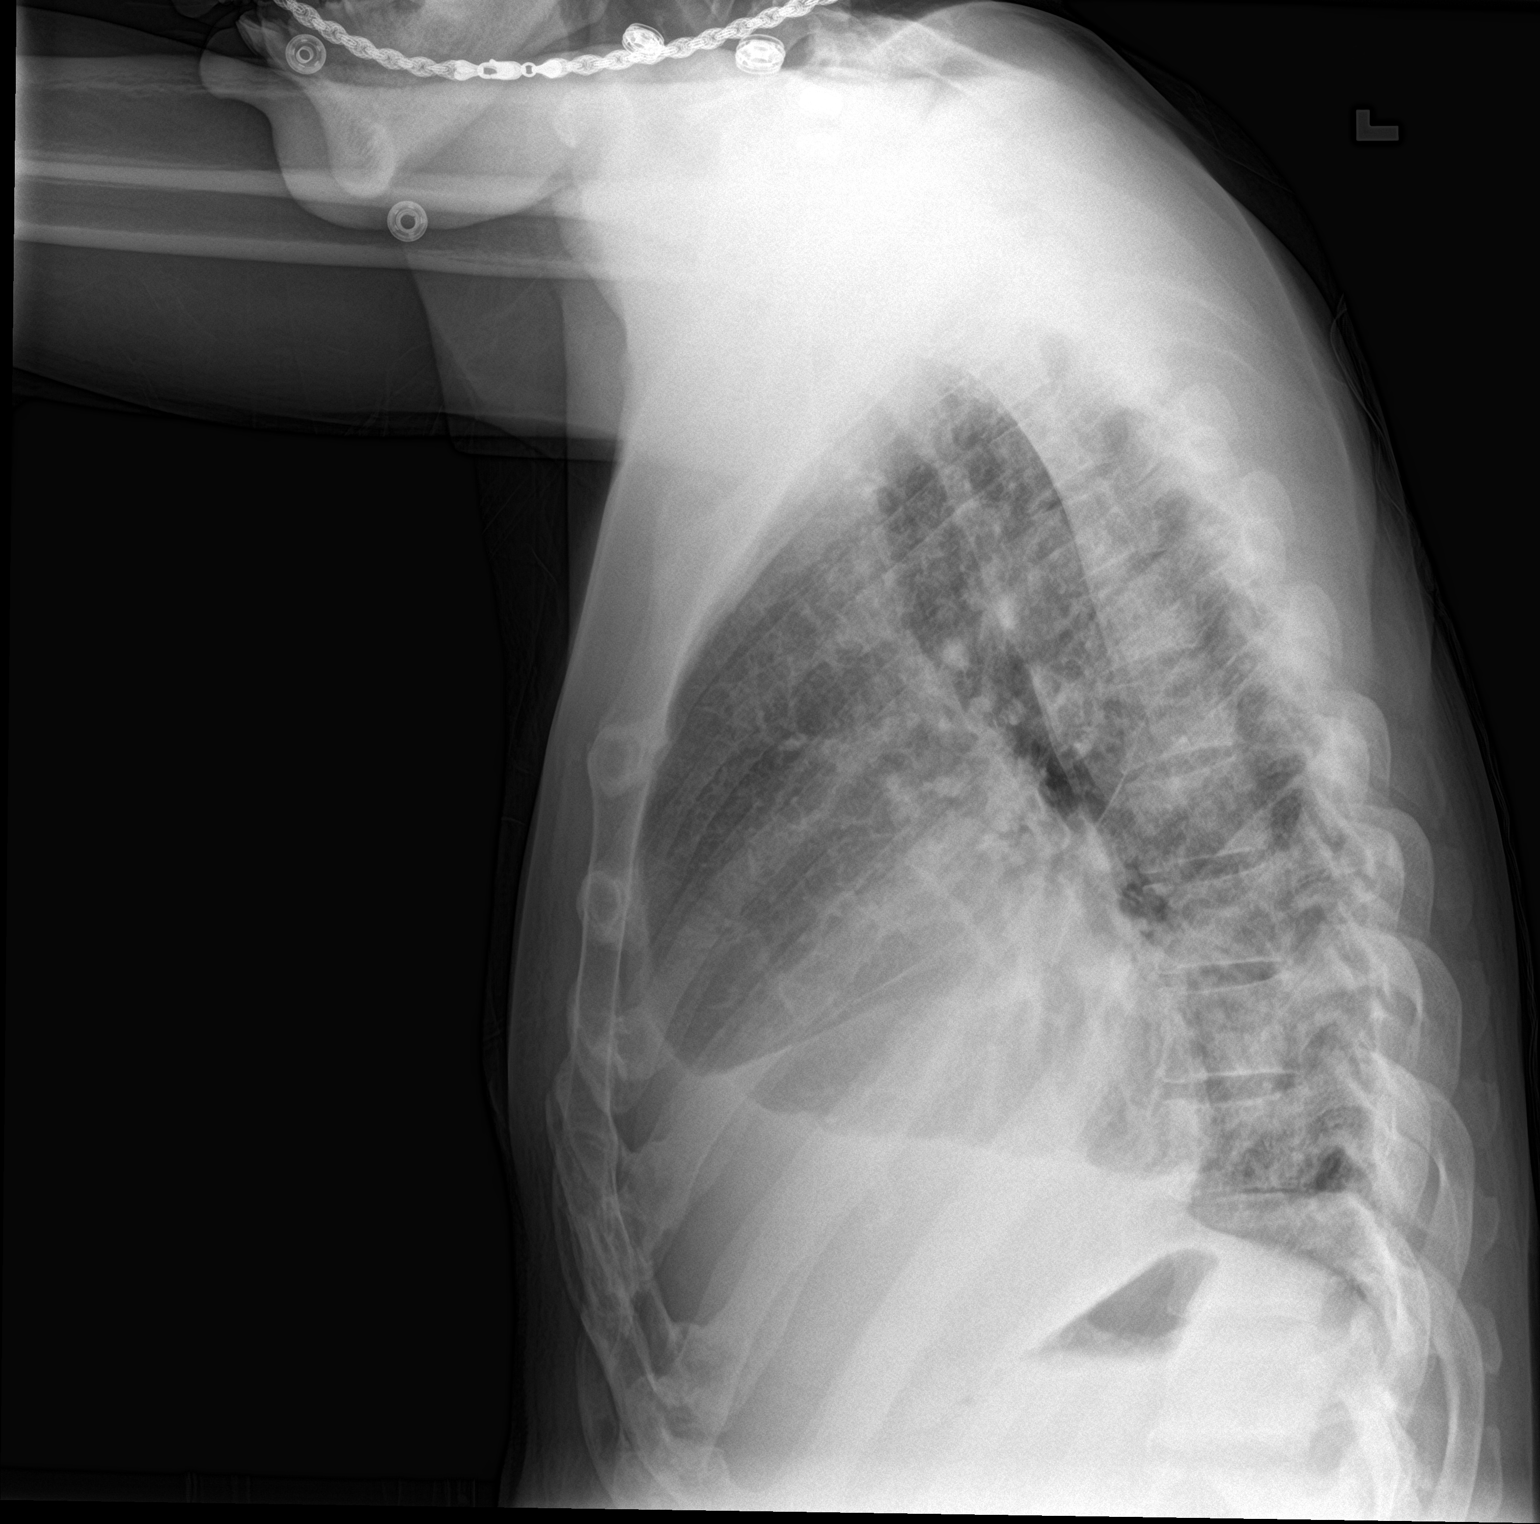

[2 of 2 positions shown; findings below may reference images not displayed]

FINDINGS: No evidence of pneumothorax following right-sided thoracentesis.
Significantly reduced right-sided pleural effusion. Minimal residual
pleural fluid and atelectasis. Stable cardiomegaly. Atherosclerotic
calcifications are again noted in the transverse aorta. Mild
vascular congestion without overt edema. No acute osseous
abnormality.
IMPRESSION: Status post right-sided thoracentesis without evidence of
pneumothorax.

## 2020-06-25 IMAGING — CR DG CHEST 2V
2 series · 2 of 2 positions shown · non-contrast
Comparison: 02/01/2018

CLINICAL DATA: 51-year-old male with a history of pleural effusion

EXAM:
CHEST - 2 VIEW

[chest ap]
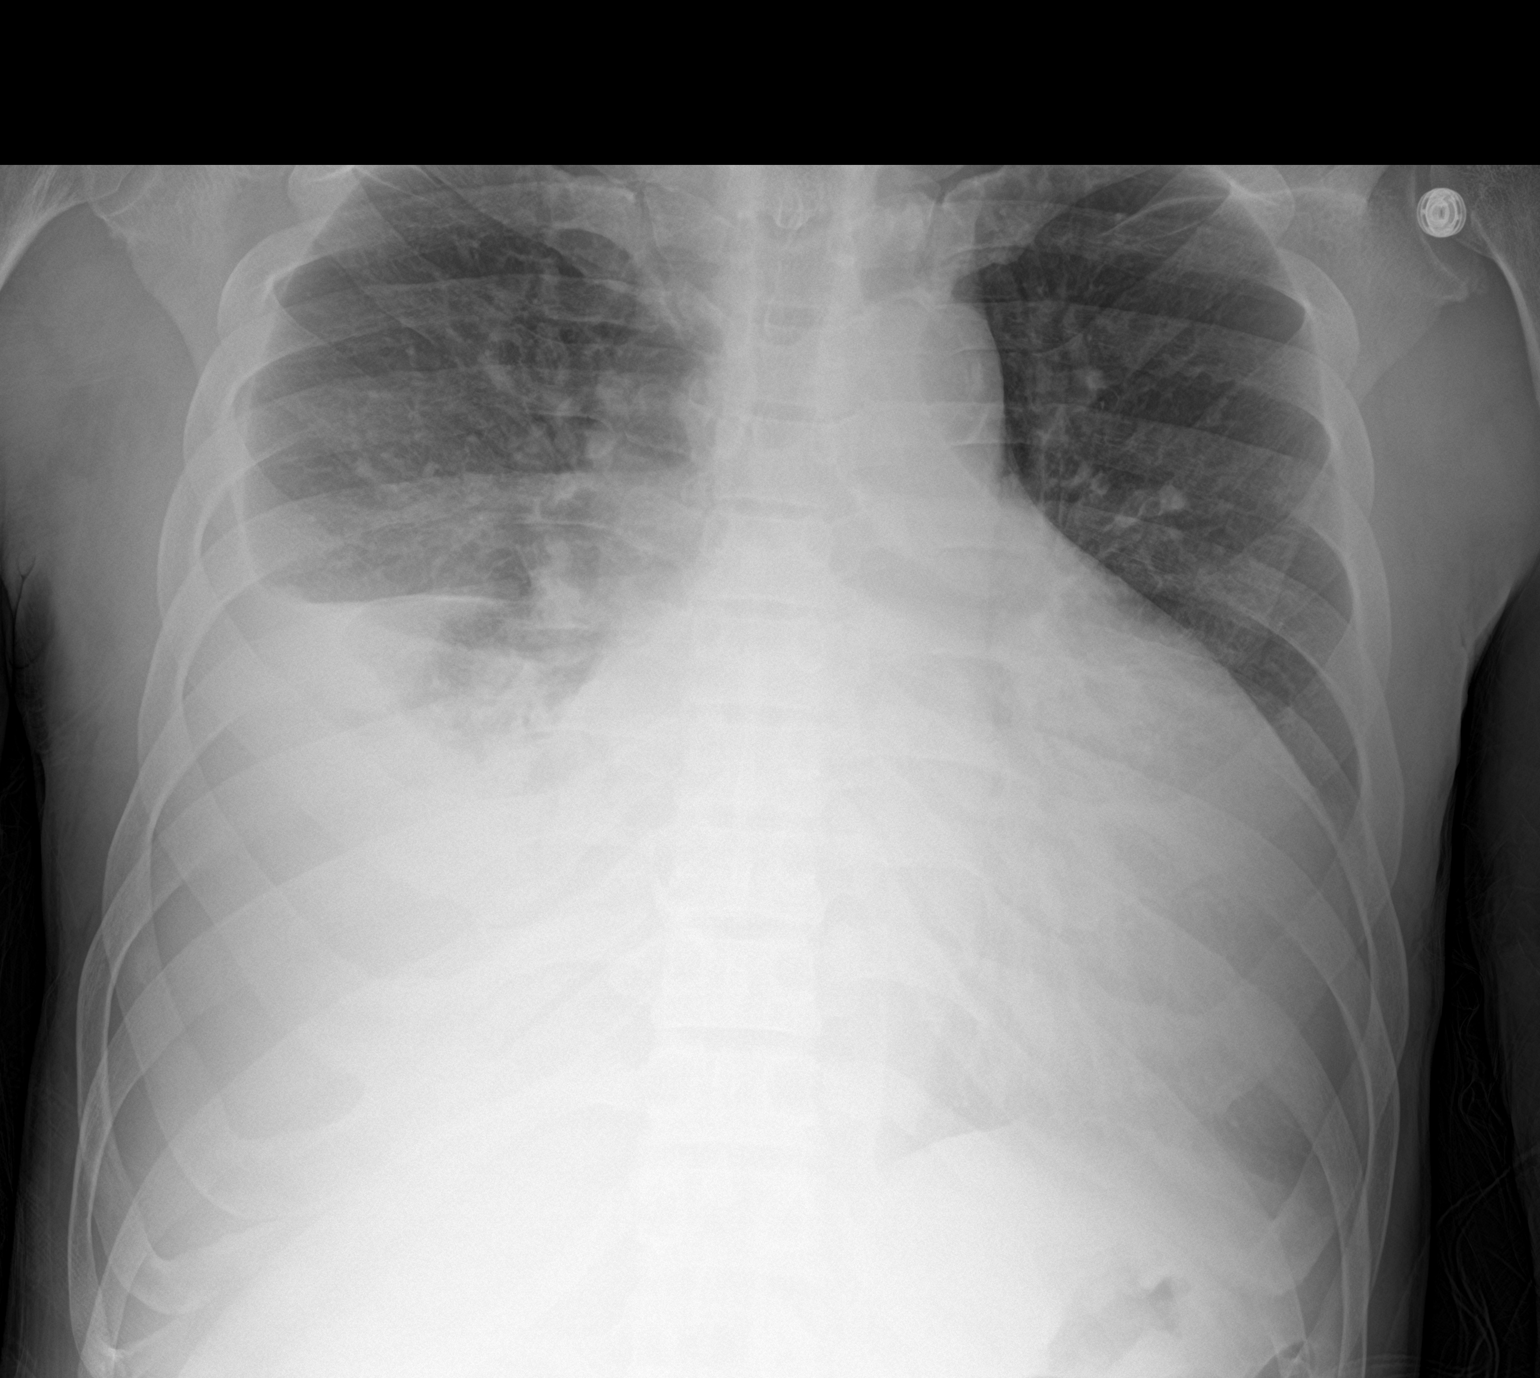

[chest lat]
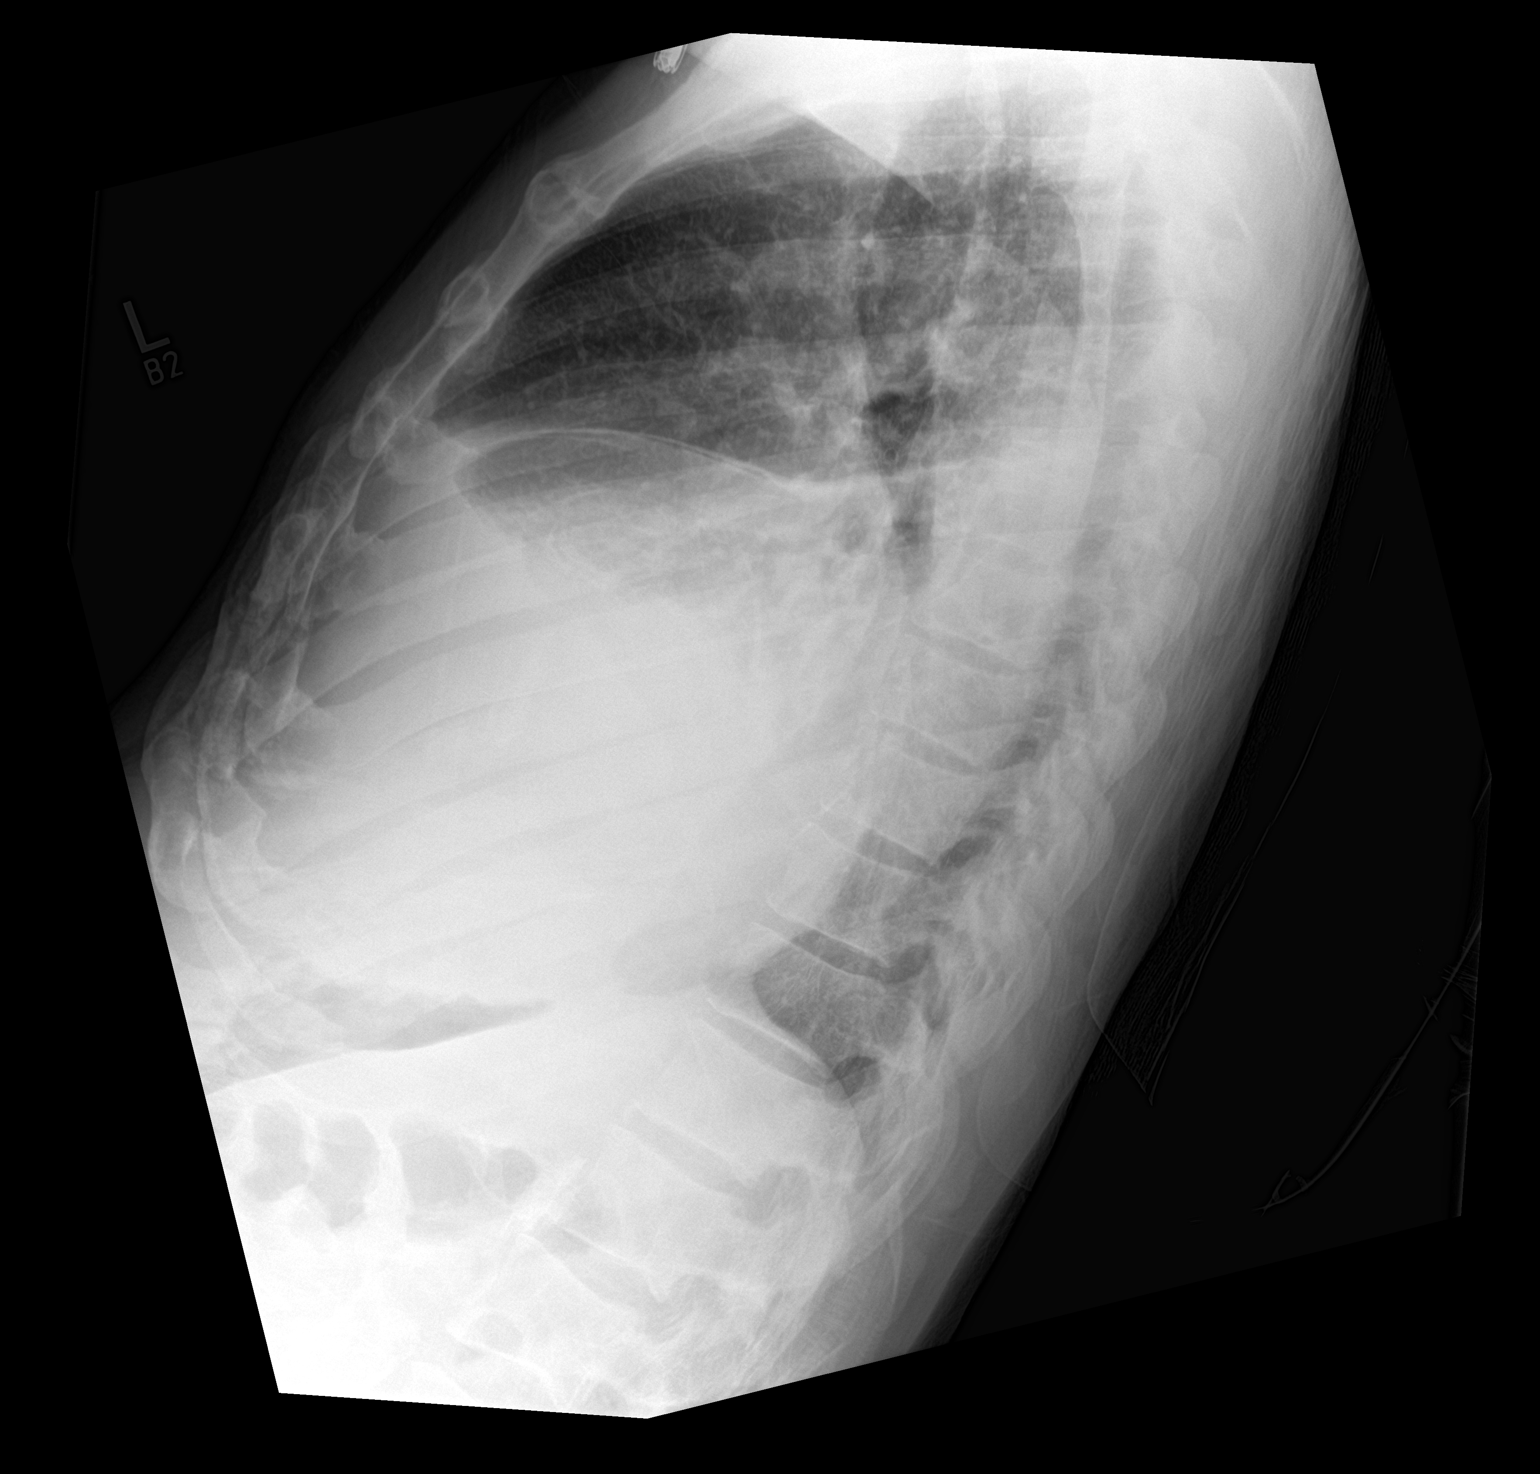

[2 of 2 positions shown; findings below may reference images not displayed]

FINDINGS: Cardiomediastinal silhouette likely unchanged, partially obscured by
overlying lung and pleural disease. Persisting cardiomegaly.

Interval enlargement of right-sided pleural fluid with obscuration
the right hemidiaphragm and the right heart border.

No pneumothorax.

No significant interlobular septal thickening.
IMPRESSION: Interval enlargement of right-sided pleural effusion with associated
atelectasis/consolidation.

Cardiomegaly

## 2020-06-25 IMAGING — CT CT ABD-PELV W/ CM
2 of 5 series · 16 of 46 positions shown, 18 images · IV contrast (Omni 300)
Comparison: 01/31/2018

CLINICAL DATA: High-grade bowel obstruction

EXAM:
CT ABDOMEN AND PELVIS WITH CONTRAST
TECHNIQUE: Multidetector CT imaging of the abdomen and pelvis was performed
using the standard protocol following bolus administration of
intravenous contrast.
CONTRAST:  100mL OMNIPAQUE IOHEXOL 300 MG/ML  SOLN

[Series 3: a/p w/ 5mm · axial · 0.68mm/px · z∈[-449,-39]mm · 13 of 94 slices shown, 15 images]
[im 6/94  soft-tissue]
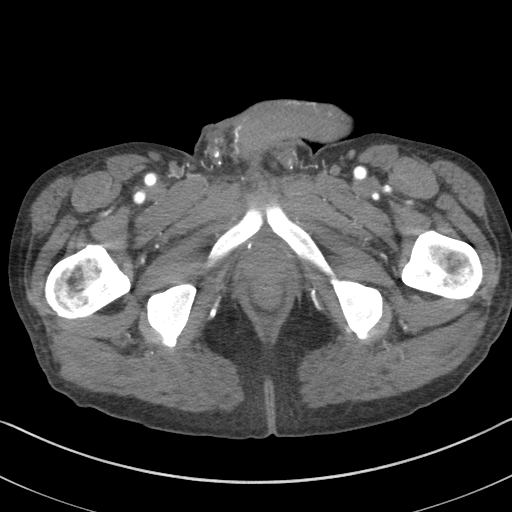
[im 6/94  bone]
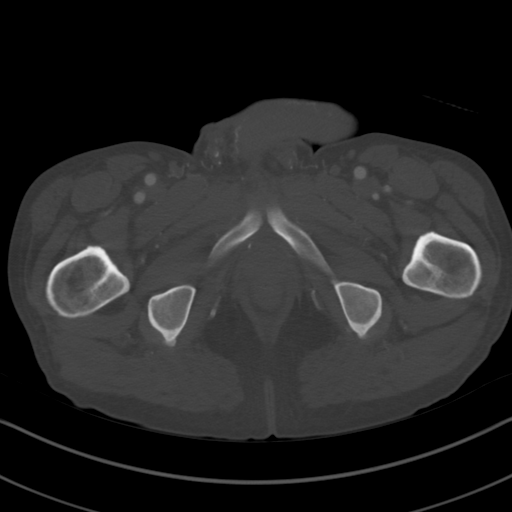
[im 11/94  soft-tissue]
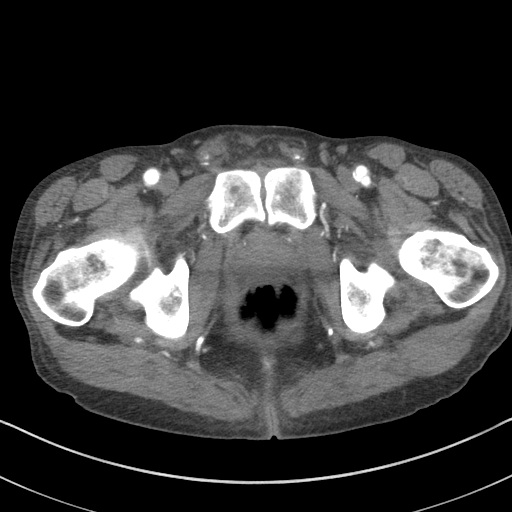
[im 22/94  soft-tissue]
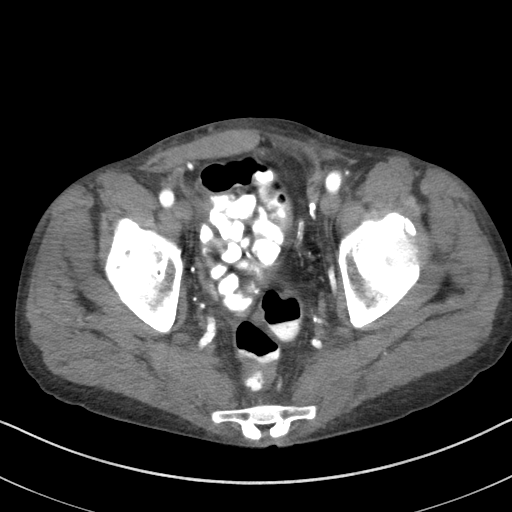
[im 28/94  soft-tissue]
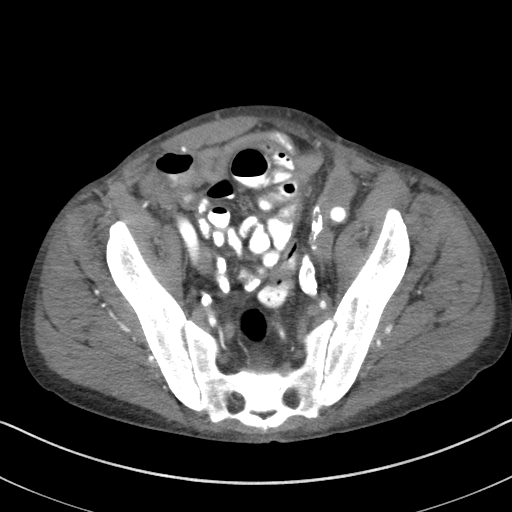
[im 33/94  soft-tissue]
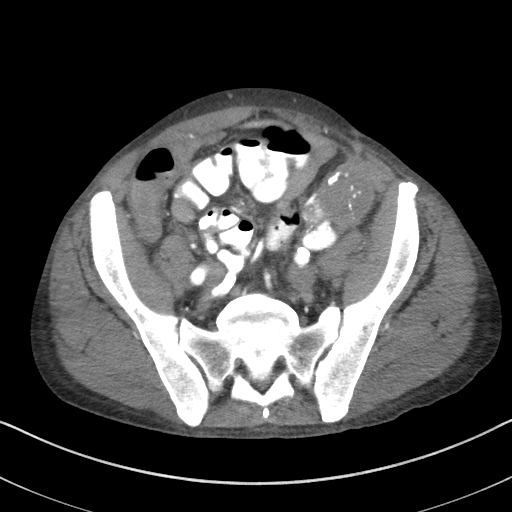
[im 39/94  soft-tissue]
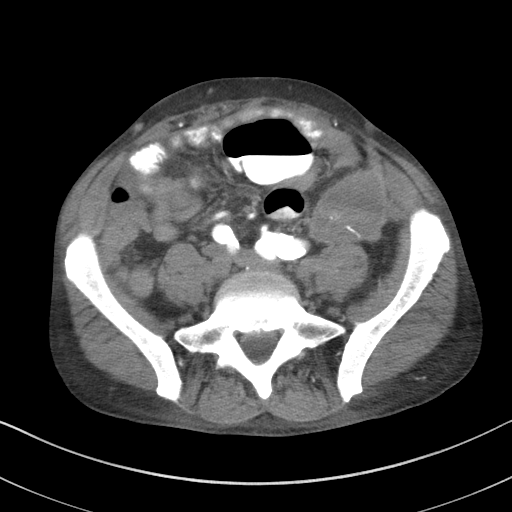
[im 50/94  soft-tissue]
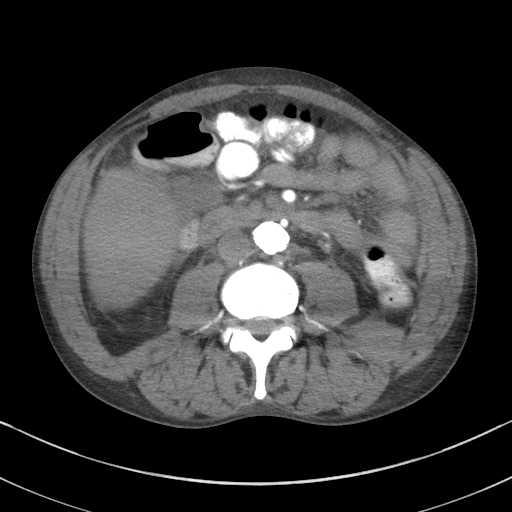
[im 55/94  soft-tissue]
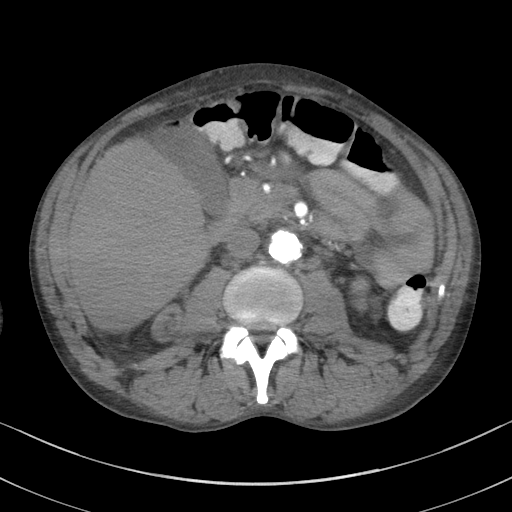
[im 61/94  soft-tissue]
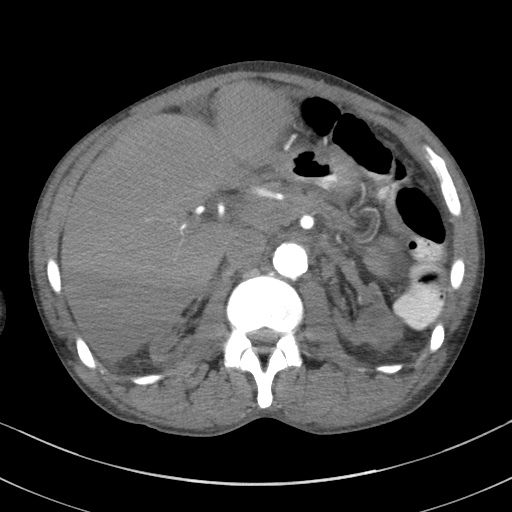
[im 61/94  bone]
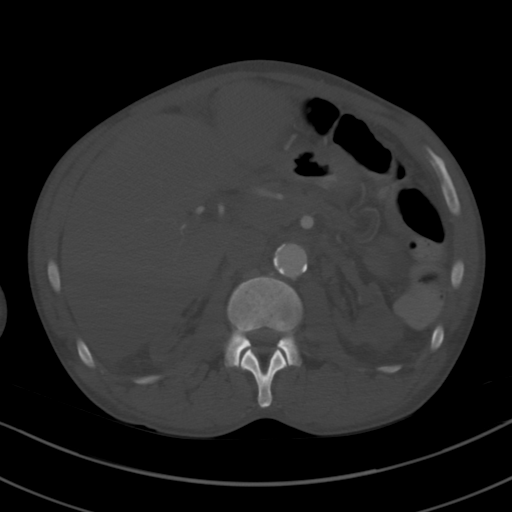
[im 66/94  soft-tissue]
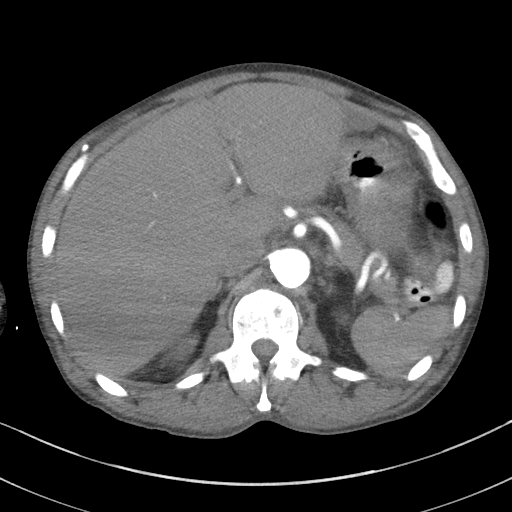
[im 72/94  soft-tissue]
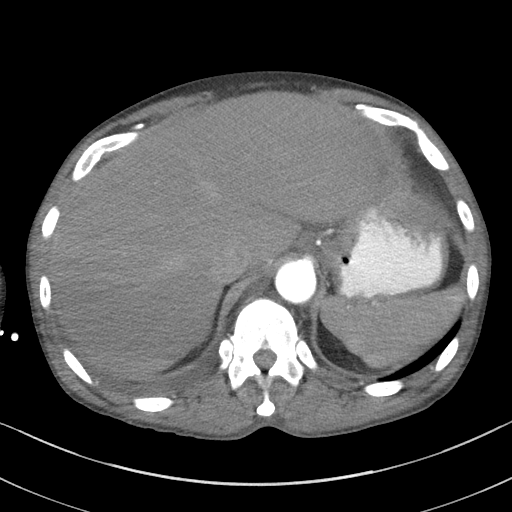
[im 83/94  soft-tissue]
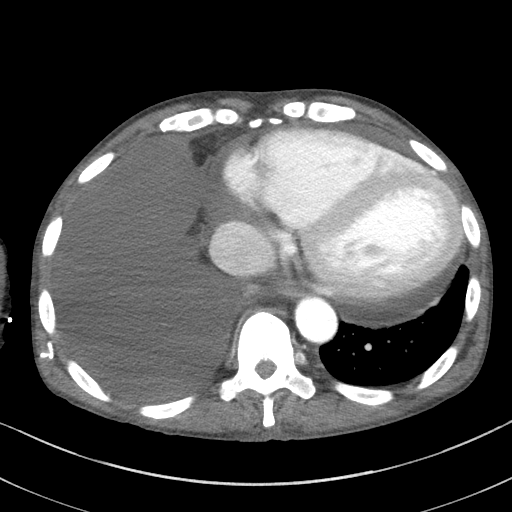
[im 88/94  soft-tissue]
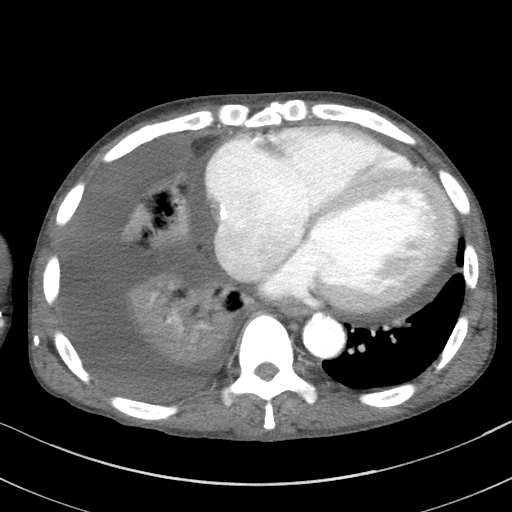

[Series 6: a/p w/ cor · coronal · 0.70mm/px · 3 of 123 slices shown]
[im 41/123  soft-tissue]
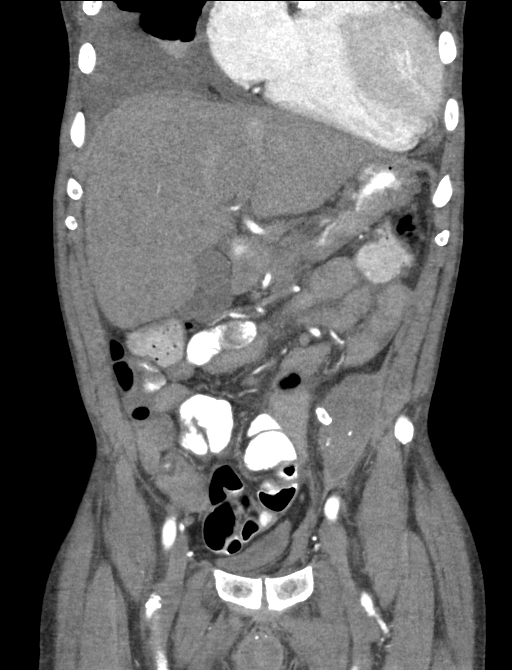
[im 55/123  soft-tissue]
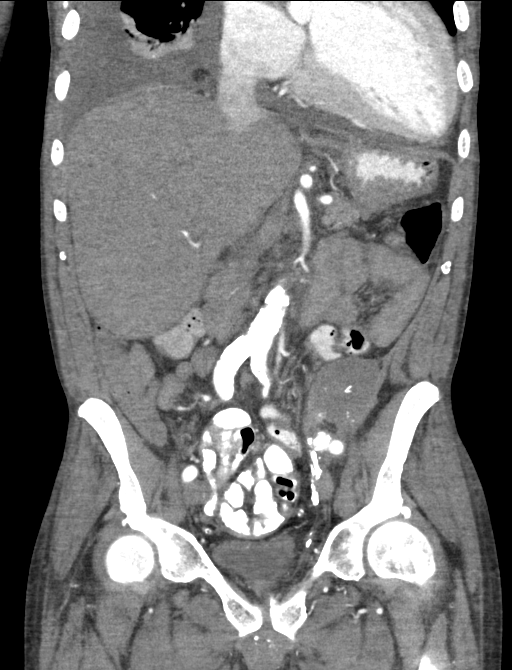
[im 68/123  soft-tissue]
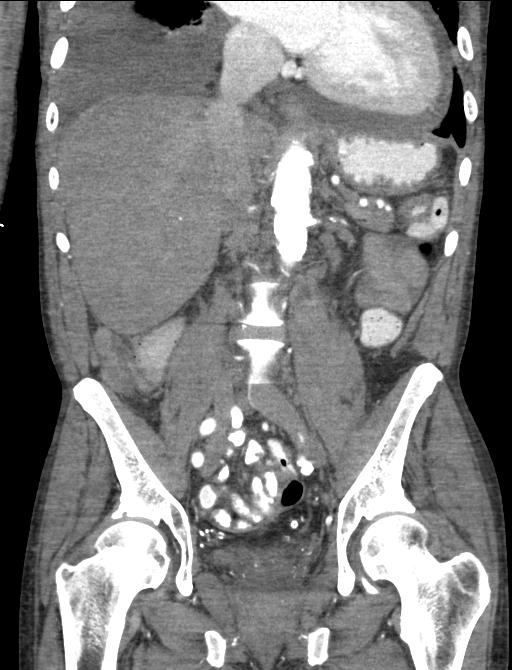

[16 of 46 positions shown; findings below may reference images not displayed]

FINDINGS: Lower chest: Moderate right pleural effusion. Right lower lobe
opacity, likely compressive atelectasis. Cardiomegaly. Moderate
pericardial effusion.

Hepatobiliary: Macronodular hepatic contour with suspected hepatic
congestion.

Gallbladder is unremarkable. No intrahepatic or extrahepatic ductal
dilatation.

Pancreas: Within normal limits.

Spleen: Within normal limits.

Adrenals/Urinary Tract: Adrenal glands are within normal limits.

Bilateral native renal cortical scarring/atrophy. Left renal cysts,
including a 2.1 cm left upper pole renal cyst with mild peripheral
complexity, poorly evaluated. Additional 3.3 cm lateral left upper
pole cystic/solid mass, indeterminate. No hydronephrosis.

Failed transplant kidney in the left lower quadrant (series 3/image
58).

Bladder is underdistended but unremarkable.

Stomach/Bowel: Stomach is within normal limits.

No evidence of bowel obstruction.

No colonic wall thickening or inflammatory changes.

Vascular/Lymphatic: No evidence of abdominal aortic aneurysm.

Atherosclerotic calcifications of the abdominal aorta and branch
vessels.

No suspicious abdominopelvic lymphadenopathy.

Reproductive: Prostate is unremarkable.

Other: No abdominopelvic ascites.

Musculoskeletal: Diffuse osseous sclerosis, compatible with renal
osteodystrophy.
IMPRESSION: No evidence of bowel obstruction.

3.3 cm left upper pole renal mass, indeterminate. Native renal
atrophy. Failed left lower quadrant renal transplant kidney.

Additional ancillary findings as above.

## 2020-06-26 ENCOUNTER — Encounter (HOSPITAL_COMMUNITY): Payer: Self-pay | Admitting: Emergency Medicine

## 2020-06-26 ENCOUNTER — Emergency Department (HOSPITAL_COMMUNITY)
Admission: EM | Admit: 2020-06-26 | Discharge: 2020-06-26 | Discharge status: Against Medical Advice | Payer: Medicare Other | Attending: Emergency Medicine | Admitting: Emergency Medicine

## 2020-06-26 DIAGNOSIS — Z87891 Personal history of nicotine dependence: Secondary | ICD-10-CM | POA: Insufficient documentation

## 2020-06-26 DIAGNOSIS — I5043 Acute on chronic combined systolic (congestive) and diastolic (congestive) heart failure: Secondary | ICD-10-CM | POA: Insufficient documentation

## 2020-06-26 DIAGNOSIS — Z79899 Other long term (current) drug therapy: Secondary | ICD-10-CM | POA: Insufficient documentation

## 2020-06-26 DIAGNOSIS — Z7902 Long term (current) use of antithrombotics/antiplatelets: Secondary | ICD-10-CM | POA: Diagnosis not present

## 2020-06-26 DIAGNOSIS — I132 Hypertensive heart and chronic kidney disease with heart failure and with stage 5 chronic kidney disease, or end stage renal disease: Secondary | ICD-10-CM | POA: Diagnosis not present

## 2020-06-26 DIAGNOSIS — N186 End stage renal disease: Secondary | ICD-10-CM | POA: Diagnosis present

## 2020-06-26 DIAGNOSIS — Z992 Dependence on renal dialysis: Secondary | ICD-10-CM | POA: Insufficient documentation

## 2020-06-26 DIAGNOSIS — J449 Chronic obstructive pulmonary disease, unspecified: Secondary | ICD-10-CM | POA: Diagnosis not present

## 2020-06-26 DIAGNOSIS — Z5329 Procedure and treatment not carried out because of patient's decision for other reasons: Secondary | ICD-10-CM | POA: Diagnosis not present

## 2020-06-26 DIAGNOSIS — Z959 Presence of cardiac and vascular implant and graft, unspecified: Secondary | ICD-10-CM | POA: Diagnosis not present

## 2020-06-26 DIAGNOSIS — E1122 Type 2 diabetes mellitus with diabetic chronic kidney disease: Secondary | ICD-10-CM | POA: Diagnosis not present

## 2020-06-26 LAB — CBG MONITORING, ED: Glucose-Capillary: 98 mg/dL (ref 70–99)

## 2020-06-26 MED ORDER — CHLORHEXIDINE GLUCONATE CLOTH 2 % EX PADS: 6.0000 | MEDICATED_PAD | Freq: Every day | CUTANEOUS | Status: DC

## 2020-06-26 MED ORDER — NEPRO/CARBSTEADY PO LIQD
237.0000 mL | Freq: Three times a day (TID) | ORAL | Status: DC
  Filled 2020-06-26: qty 237

## 2020-06-26 NOTE — ED Notes (Signed)
Pt did not want to wait for dialysis until tonight- states will either return later or go to [facility]. Left AMA

## 2020-06-26 NOTE — ED Notes (Signed)
Pt states he wears oxygen at home and is requesting it. 2L per Triage RN

## 2020-06-26 NOTE — Discharge Planning (Signed)
RNCM confirmed with Renal Navigator that pt DOES NOT have a permanent dialysis center and will continue to return to be dialyzed until he is accepted alternate HD Center.  RNCM contacting DME company to see if they can provide transport tank as pt arrived to ED without his.
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
## 2020-06-26 NOTE — ED Notes (Signed)
Lunch Tray Ordered @ 1039. 

## 2020-06-26 NOTE — ED Triage Notes (Signed)
Frank [name] is here today requesting dialysis and also c/o bil hip pain , last treatment 1 week ago , would like blood drawn when he is in the back

## 2020-06-30 IMAGING — DX DG CHEST 2V
2 series · 2 of 2 positions shown · non-contrast
Comparison: PA and lateral chest x-ray February 04, 2018

CLINICAL DATA: Chest pain.  The patient came from dialysis.

EXAM:
CHEST - 2 VIEW

[chest pa]
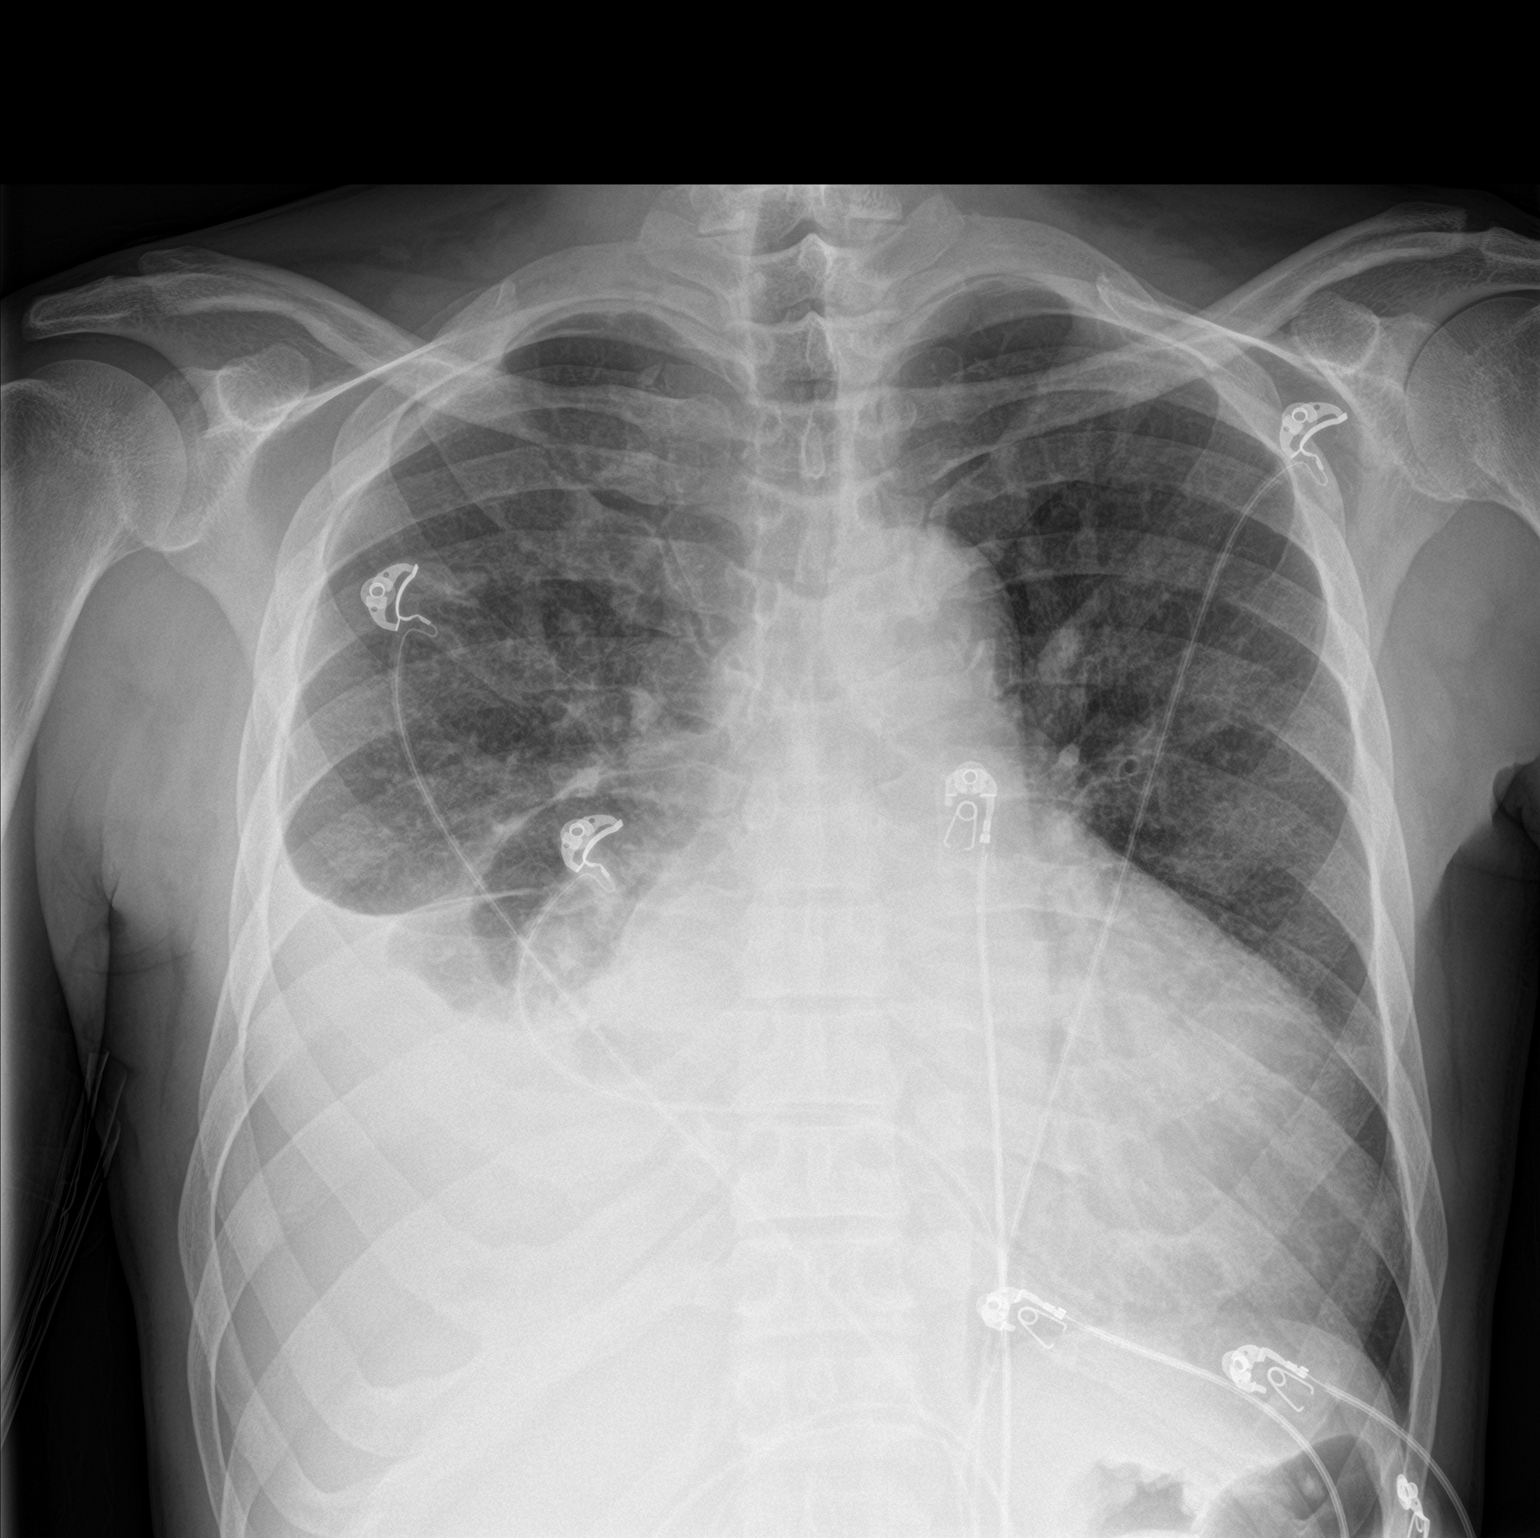

[chest lat]
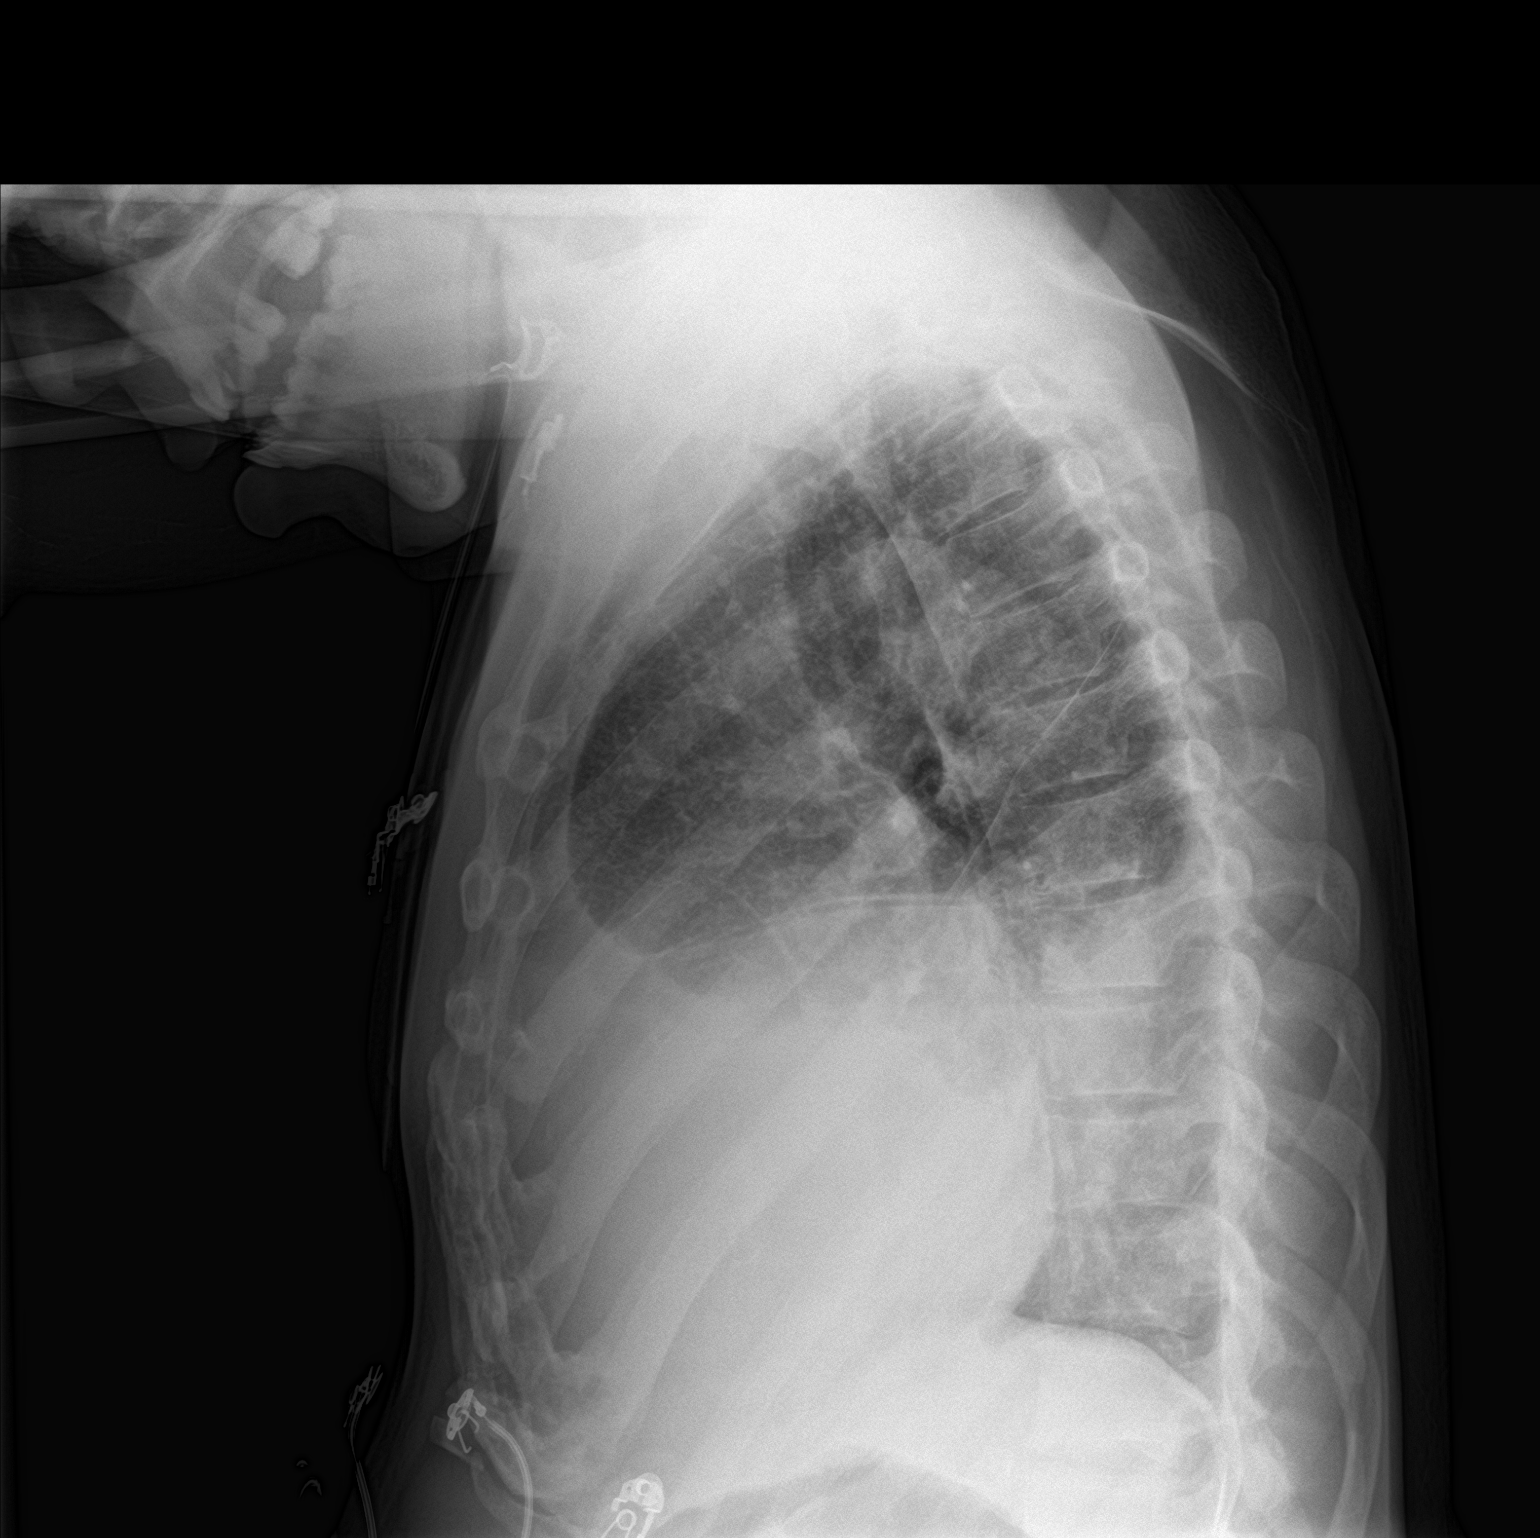

[2 of 2 positions shown; findings below may reference images not displayed]

FINDINGS: There remains a moderate-sized right pleural effusion. There is no
left pleural effusion. The interstitial markings of both lungs are
mildly increased. The cardiac silhouette is enlarged and the
pulmonary vascularity mildly engorged. There is calcification in the
wall of the aortic arch. The bony thorax is unremarkable.
IMPRESSION: Moderate-sized right pleural effusion little changed from the
earlier study. One cannot exclude right basilar atelectasis or
pneumonia.

Mild CHF.

## 2020-07-02 IMAGING — CR DG CHEST 2V
2 series · 2 of 2 positions shown · non-contrast
Comparison: 02/09/2018

CLINICAL DATA: Shortness of breath

EXAM:
CHEST - 2 VIEW

[chest lat]
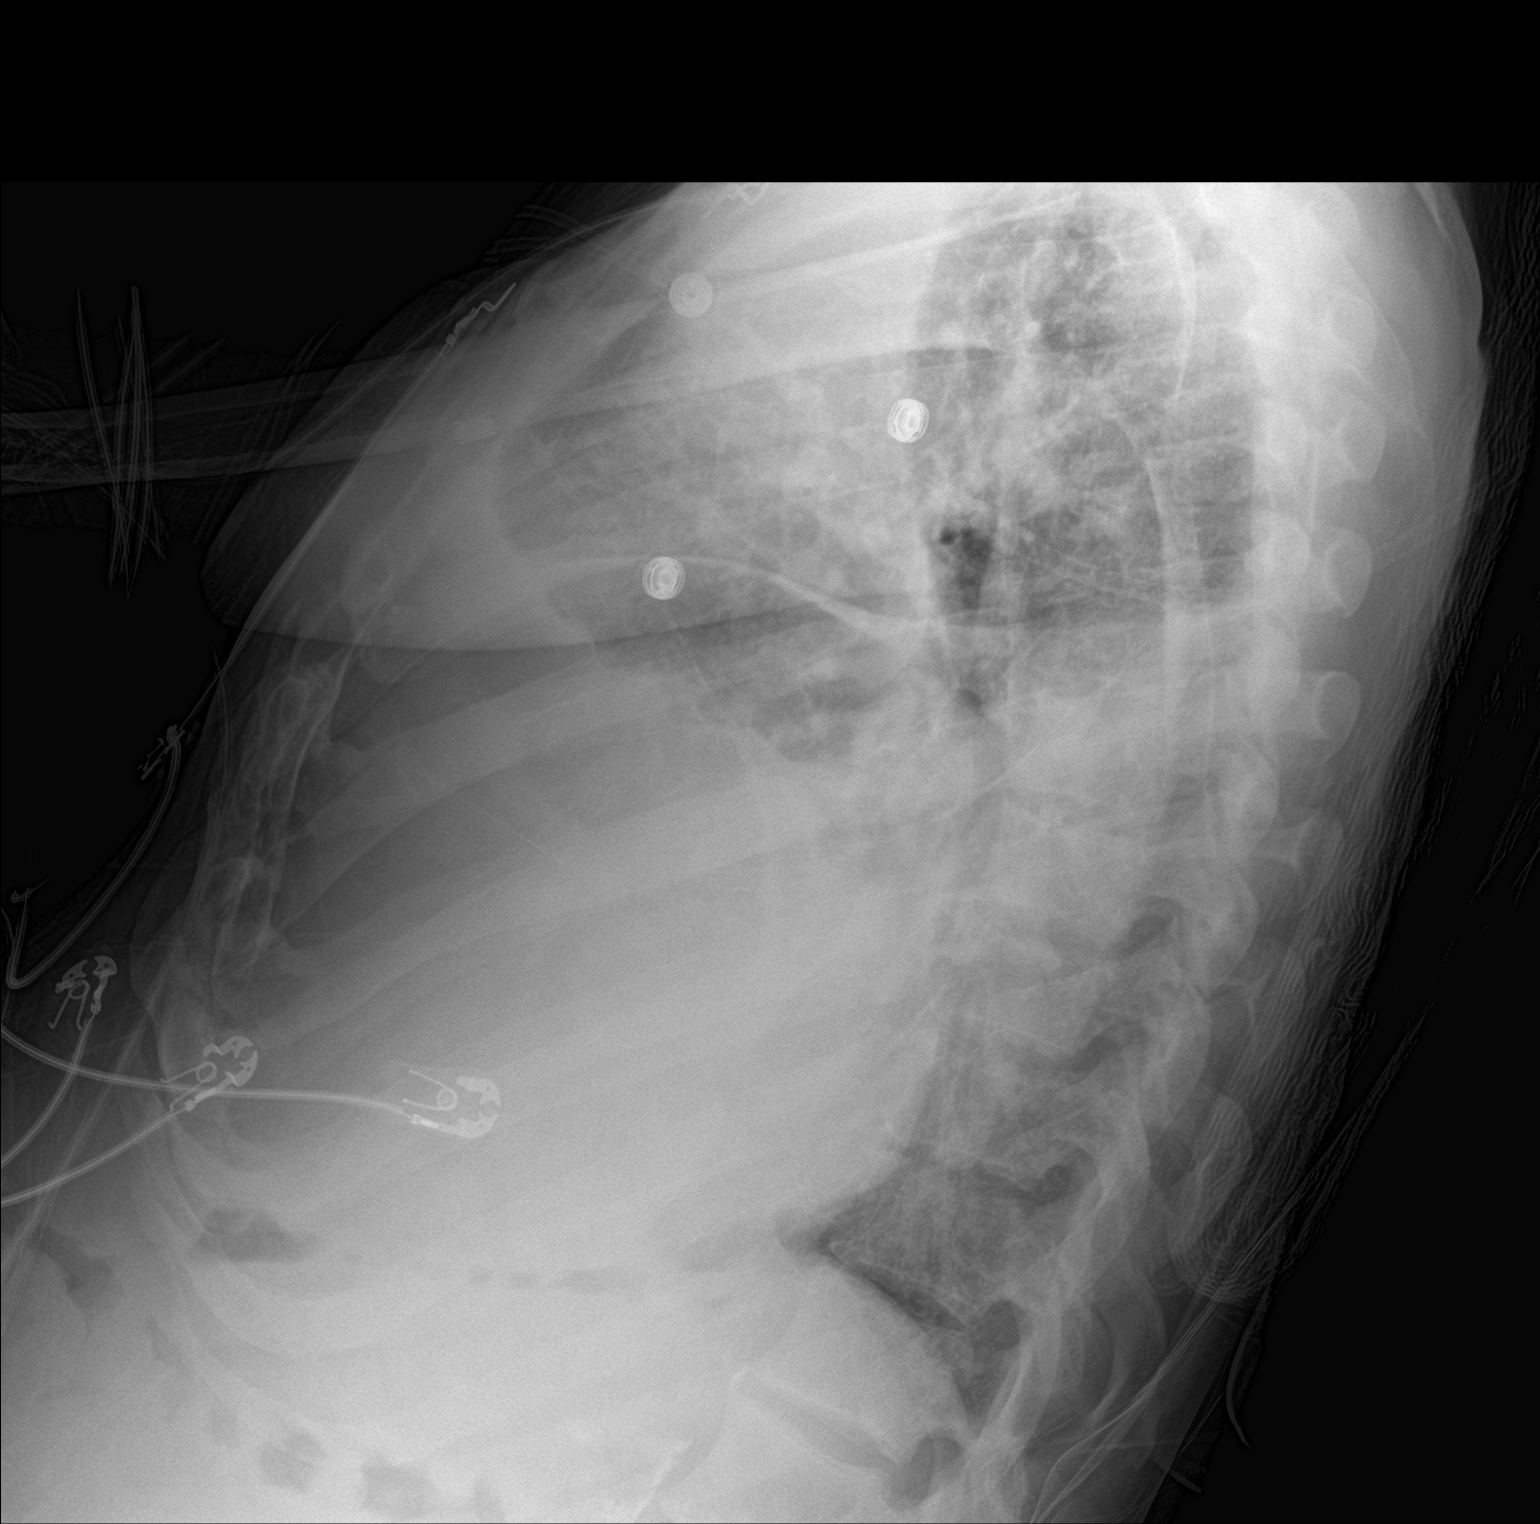

[chest ap]
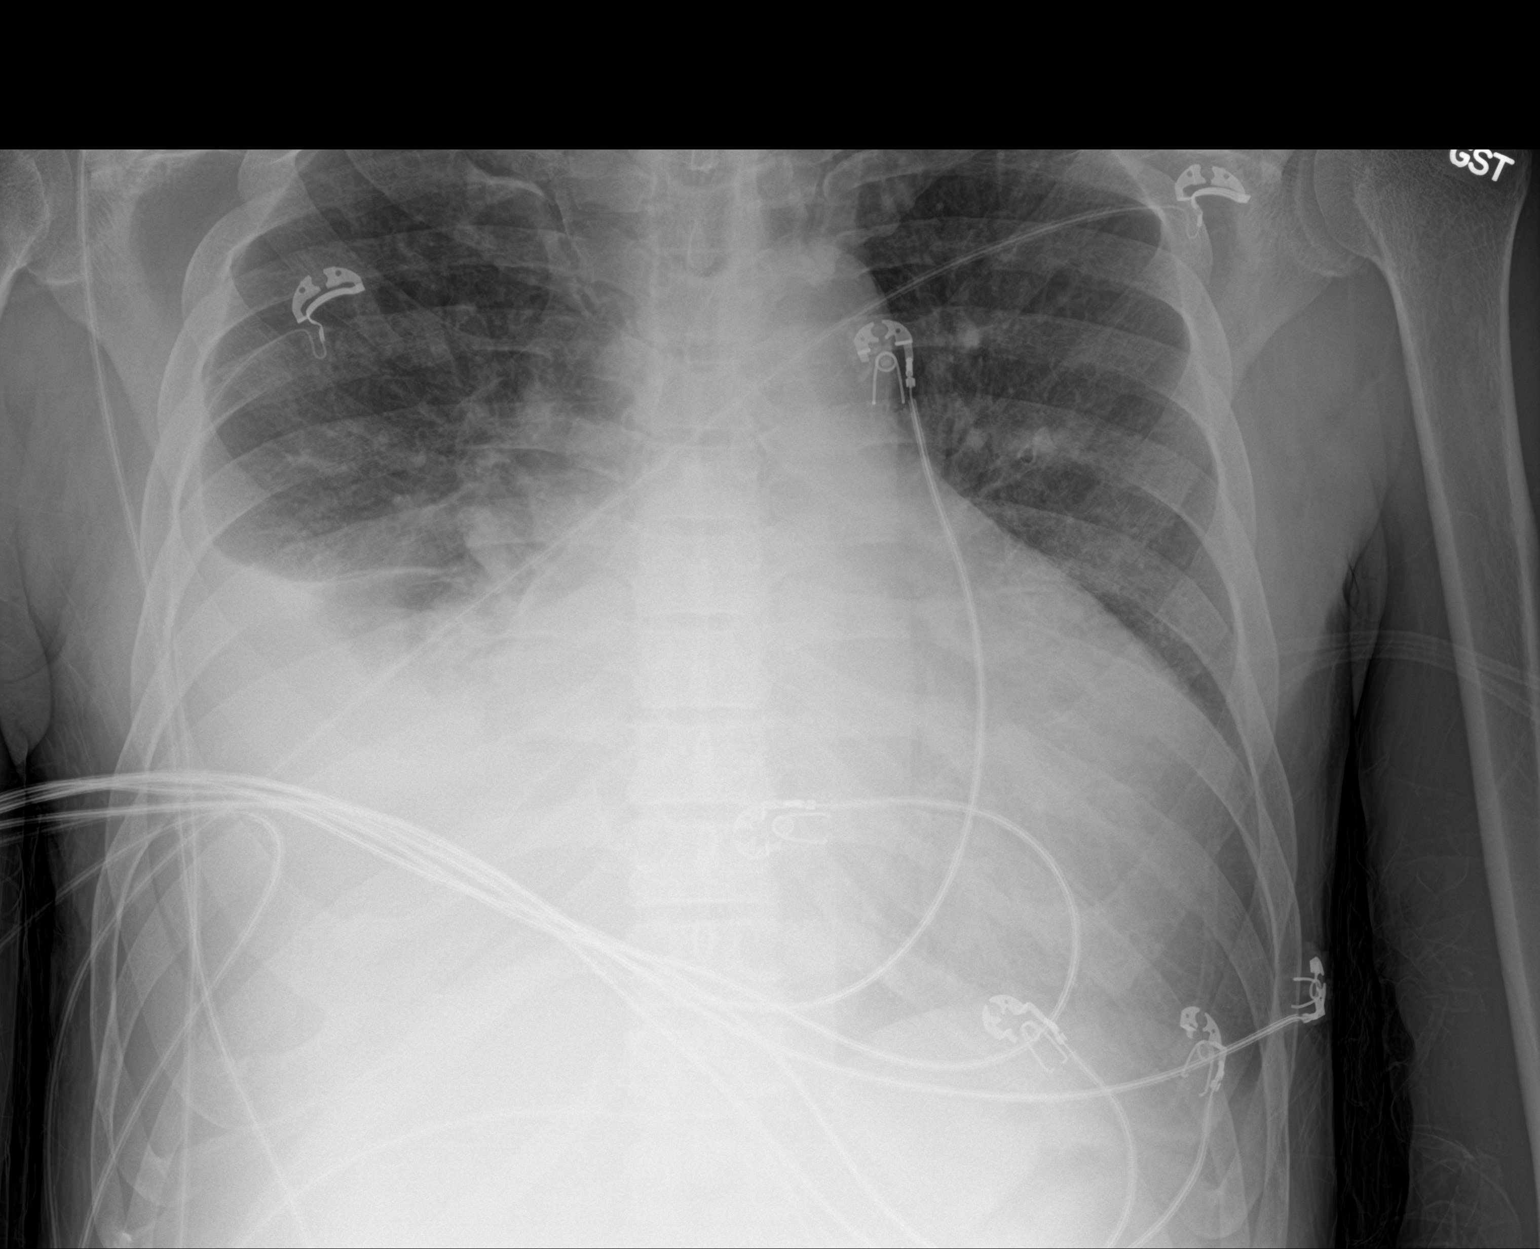

[2 of 2 positions shown; findings below may reference images not displayed]

FINDINGS: Medium-sized right pleural effusion, unchanged. Cardiomegaly is also
unchanged. Consolidation or atelectasis of the right lung remains.
No pneumothorax.
IMPRESSION: Unchanged medium-sized right pleural effusion with associated right
basilar atelectasis or consolidation.

## 2020-07-05 IMAGING — DX DG CHEST 2V
2 series · 2 of 2 positions shown · non-contrast
Comparison: 02/11/2018

CLINICAL DATA: Cough and shortness of breath

EXAM:
CHEST - 2 VIEW

[chest pa]
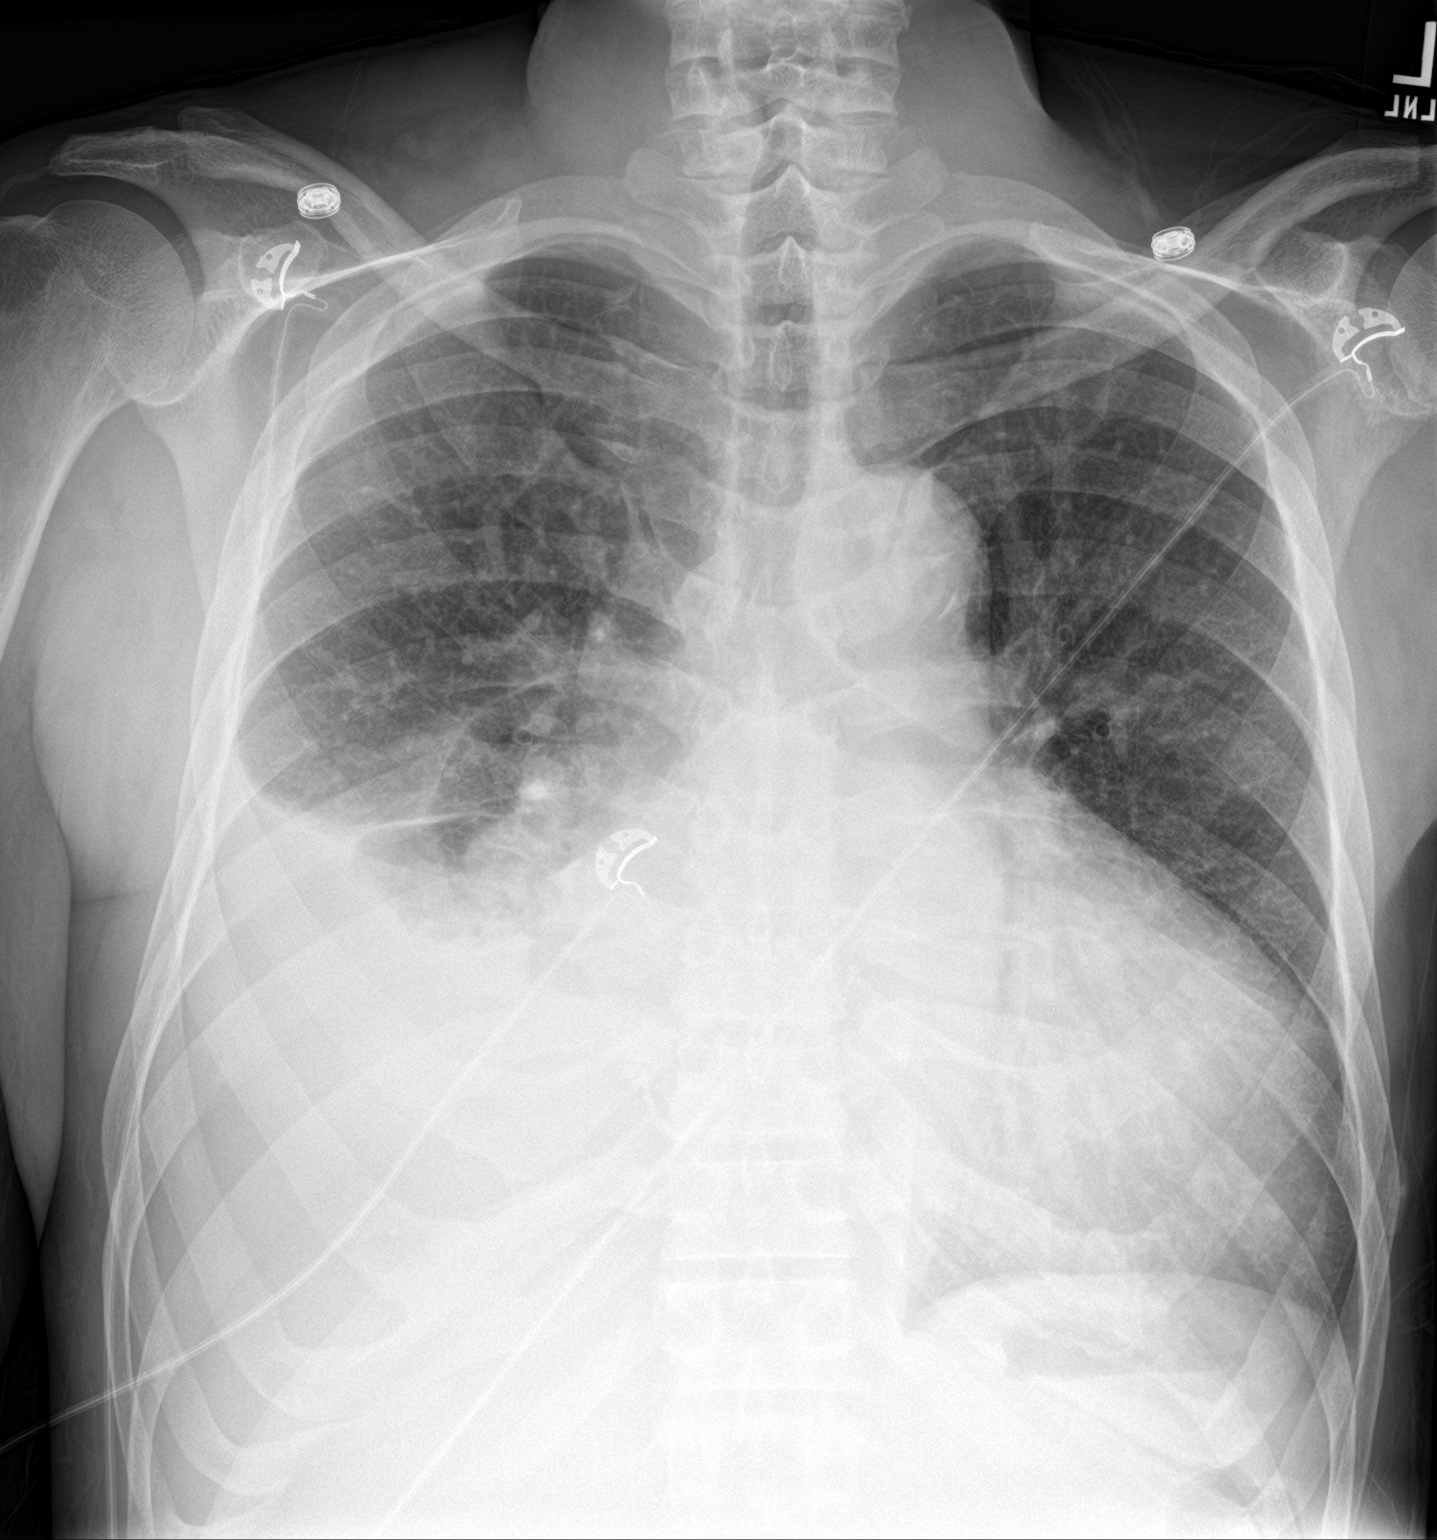

[chest lat]
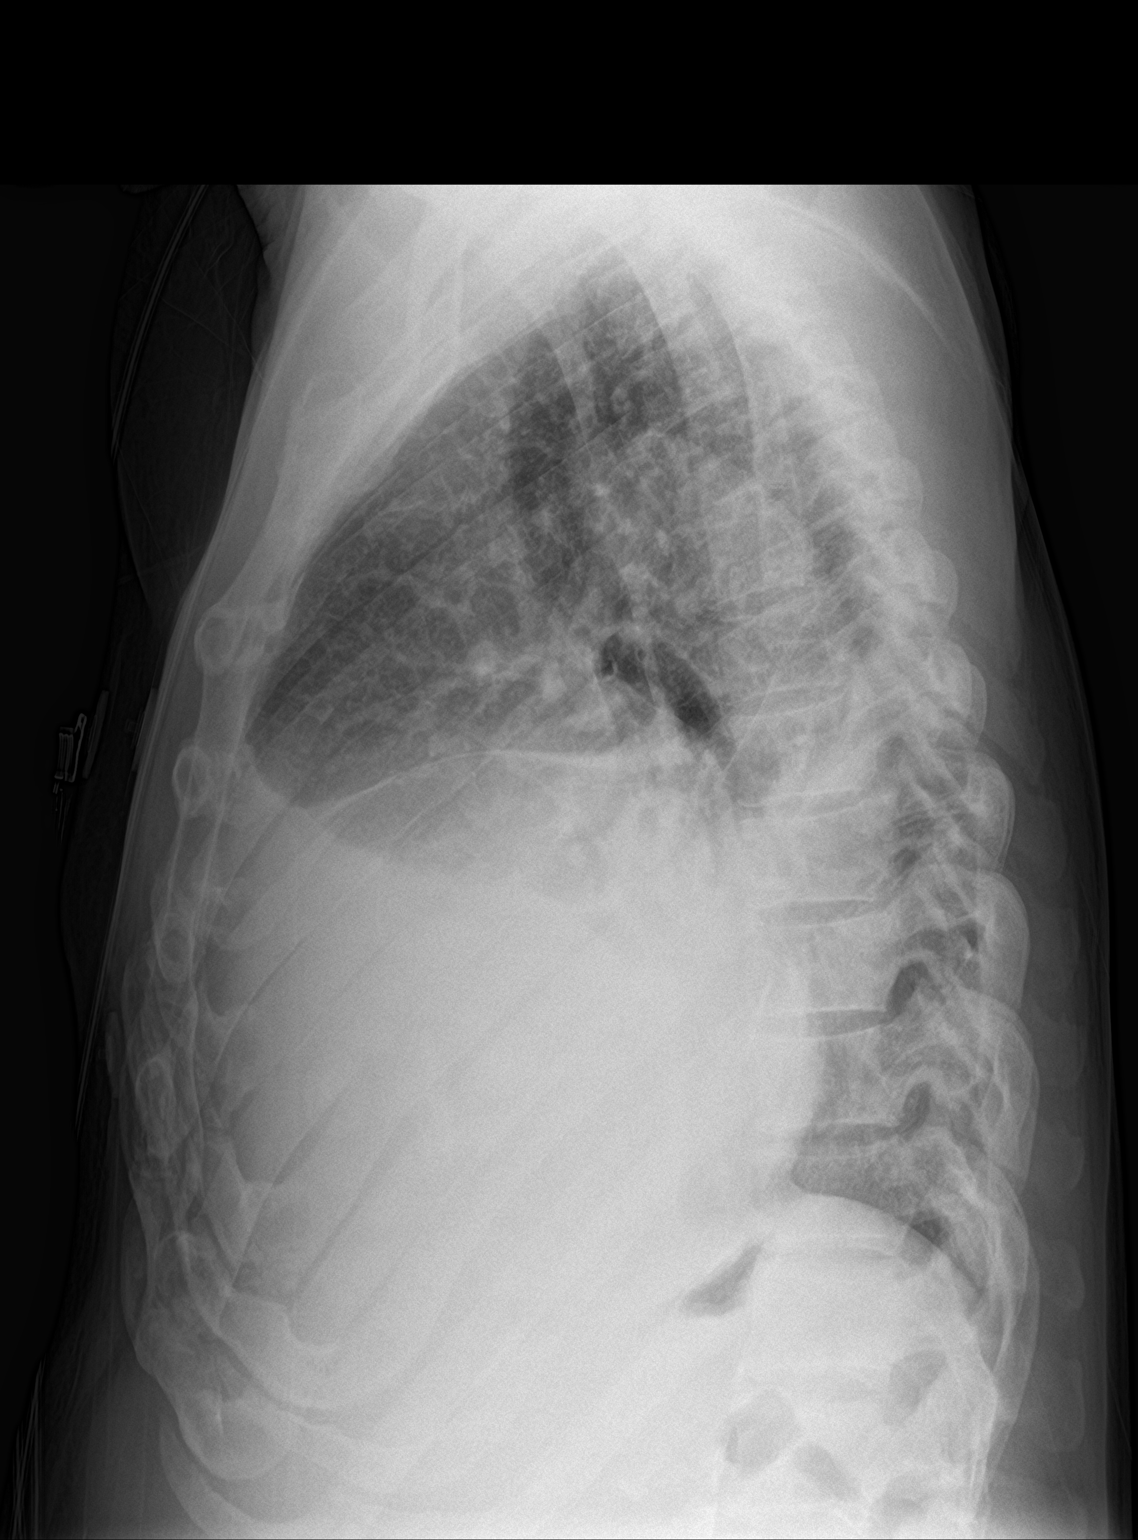

[2 of 2 positions shown; findings below may reference images not displayed]

FINDINGS: Cardiac shadow remains enlarged. Persistent right basilar infiltrate
and effusion are seen stable in appearance from the prior exam. Left
lung remains clear. No new focal abnormality is seen.
IMPRESSION: Stable right basilar infiltrate and effusion.

## 2020-07-06 IMAGING — NM NM HEPATOBILIARY IMAGE, INC GB
1 series · 6 of 6 positions shown · non-contrast
Comparison: 02/04/2018 CT abdomen/pelvis. 02/14/2018 abdominal
sonogram.

CLINICAL DATA: Inpatient.  Right upper quadrant abdominal pain.

EXAM:
NUCLEAR MEDICINE HEPATOBILIARY IMAGING
TECHNIQUE: Sequential images of the abdomen were obtained [DATE] minutes
following intravenous administration of radiopharmaceutical.
RADIOPHARMACEUTICALS:  5.3 mCi Xc-33m  Choletec IV

[he hepatobiliary · 4.52mm/px · 6 of 60 frames shown]
[frame 6/60]
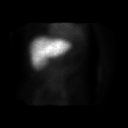
[frame 16/60]
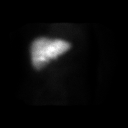
[frame 26/60]
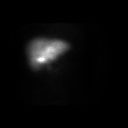
[frame 36/60]
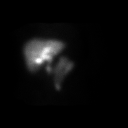
[frame 46/60]
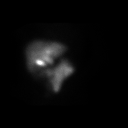
[frame 56/60]
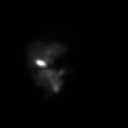

[6 of 6 positions shown; findings below may reference images not displayed]

FINDINGS: Prompt uptake and biliary excretion of activity by the liver is
seen. Gallbladder activity is visualized, consistent with patency of
cystic duct. Biliary activity passes into small bowel, consistent
with patent common bile duct.
IMPRESSION: Normal hepatobiliary scintigraphy study. Patent cystic duct, which
is not compatible with acute cholecystitis. Patent common bile duct.

## 2020-07-10 IMAGING — DX DG ABDOMEN ACUTE W/ 1V CHEST
2 series · 2 of 2 positions shown · non-contrast
Comparison: 02/14/2018.

CLINICAL DATA: Shortness of breath.

EXAM:
DG ABDOMEN ACUTE W/ 1V CHEST

[w chest pa]
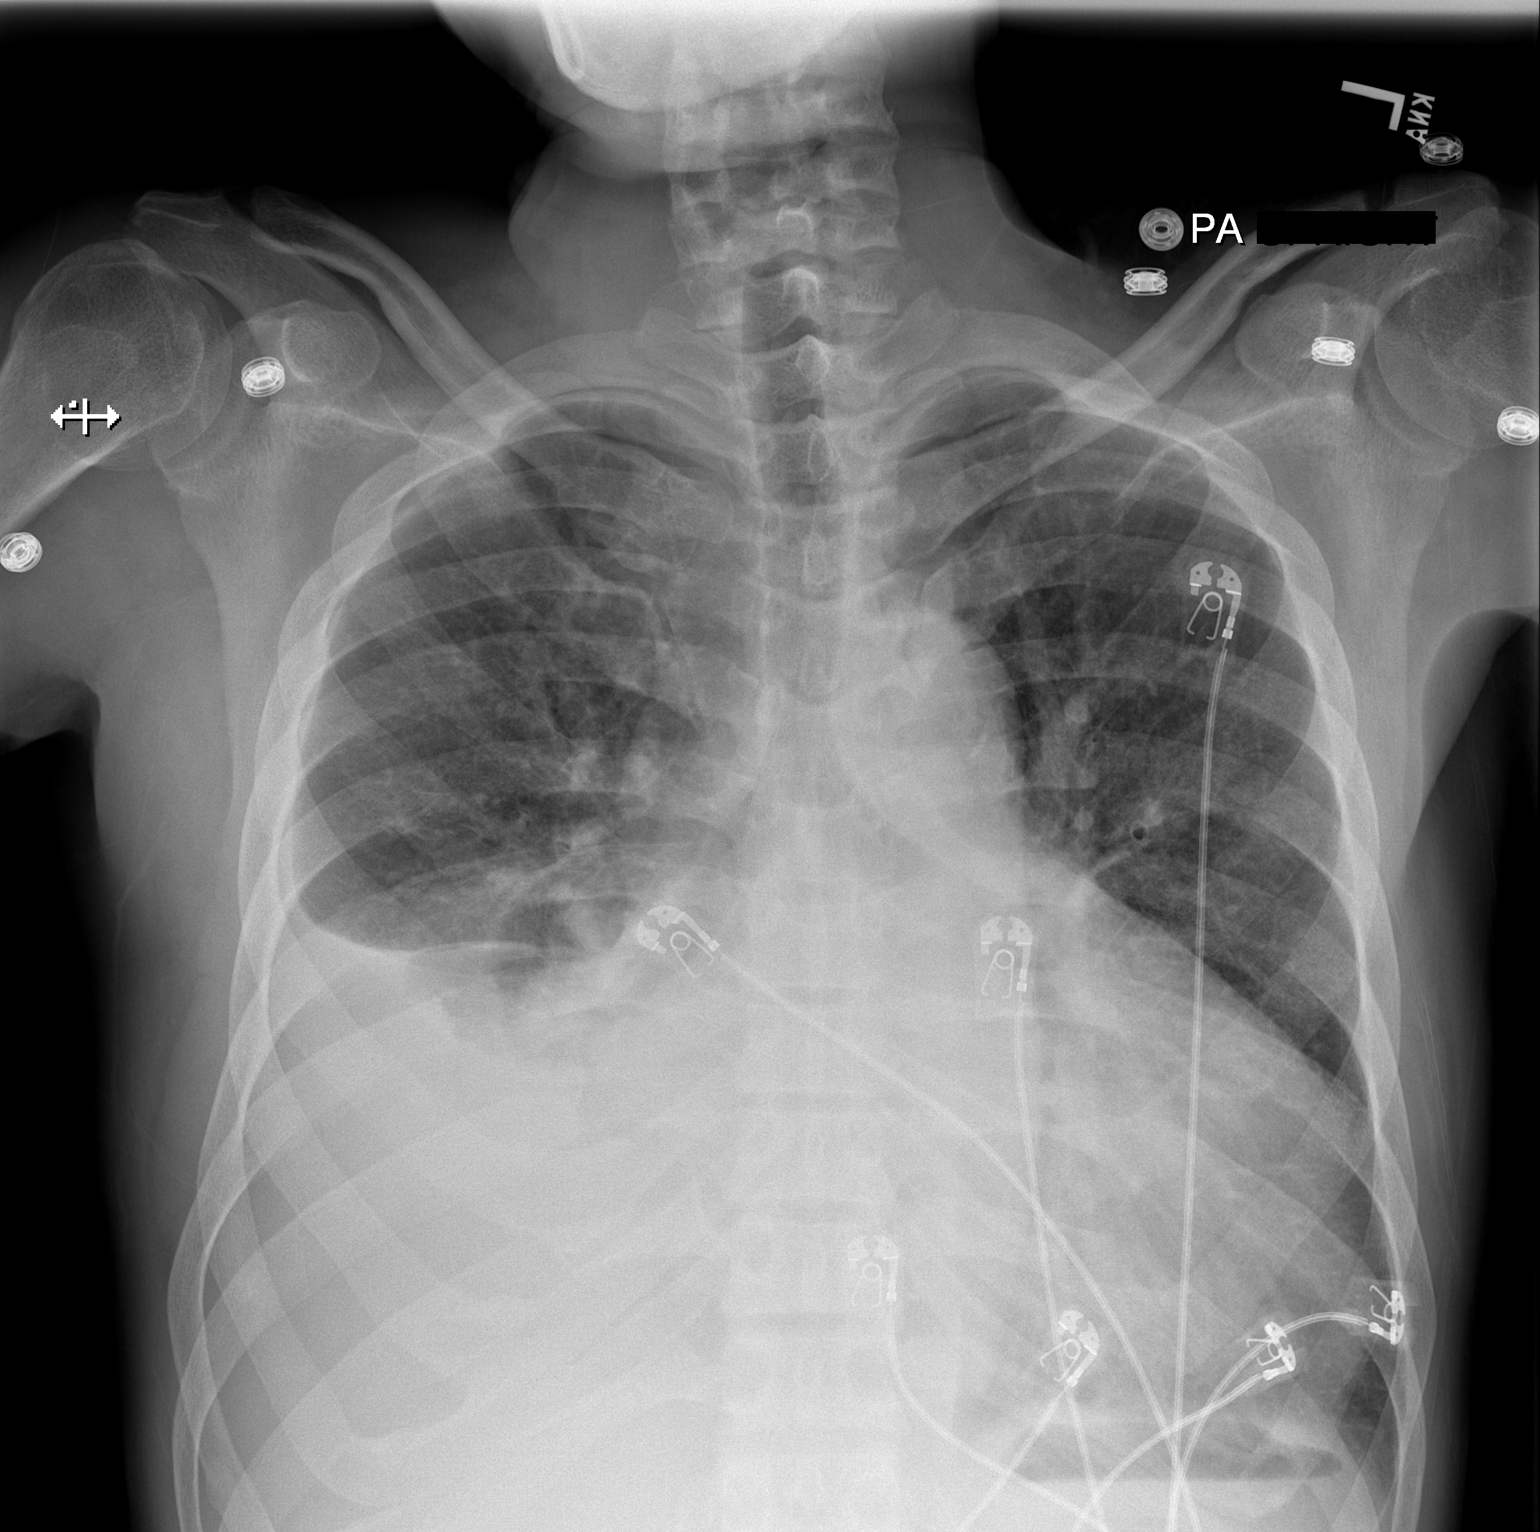

[w abdomen upright]
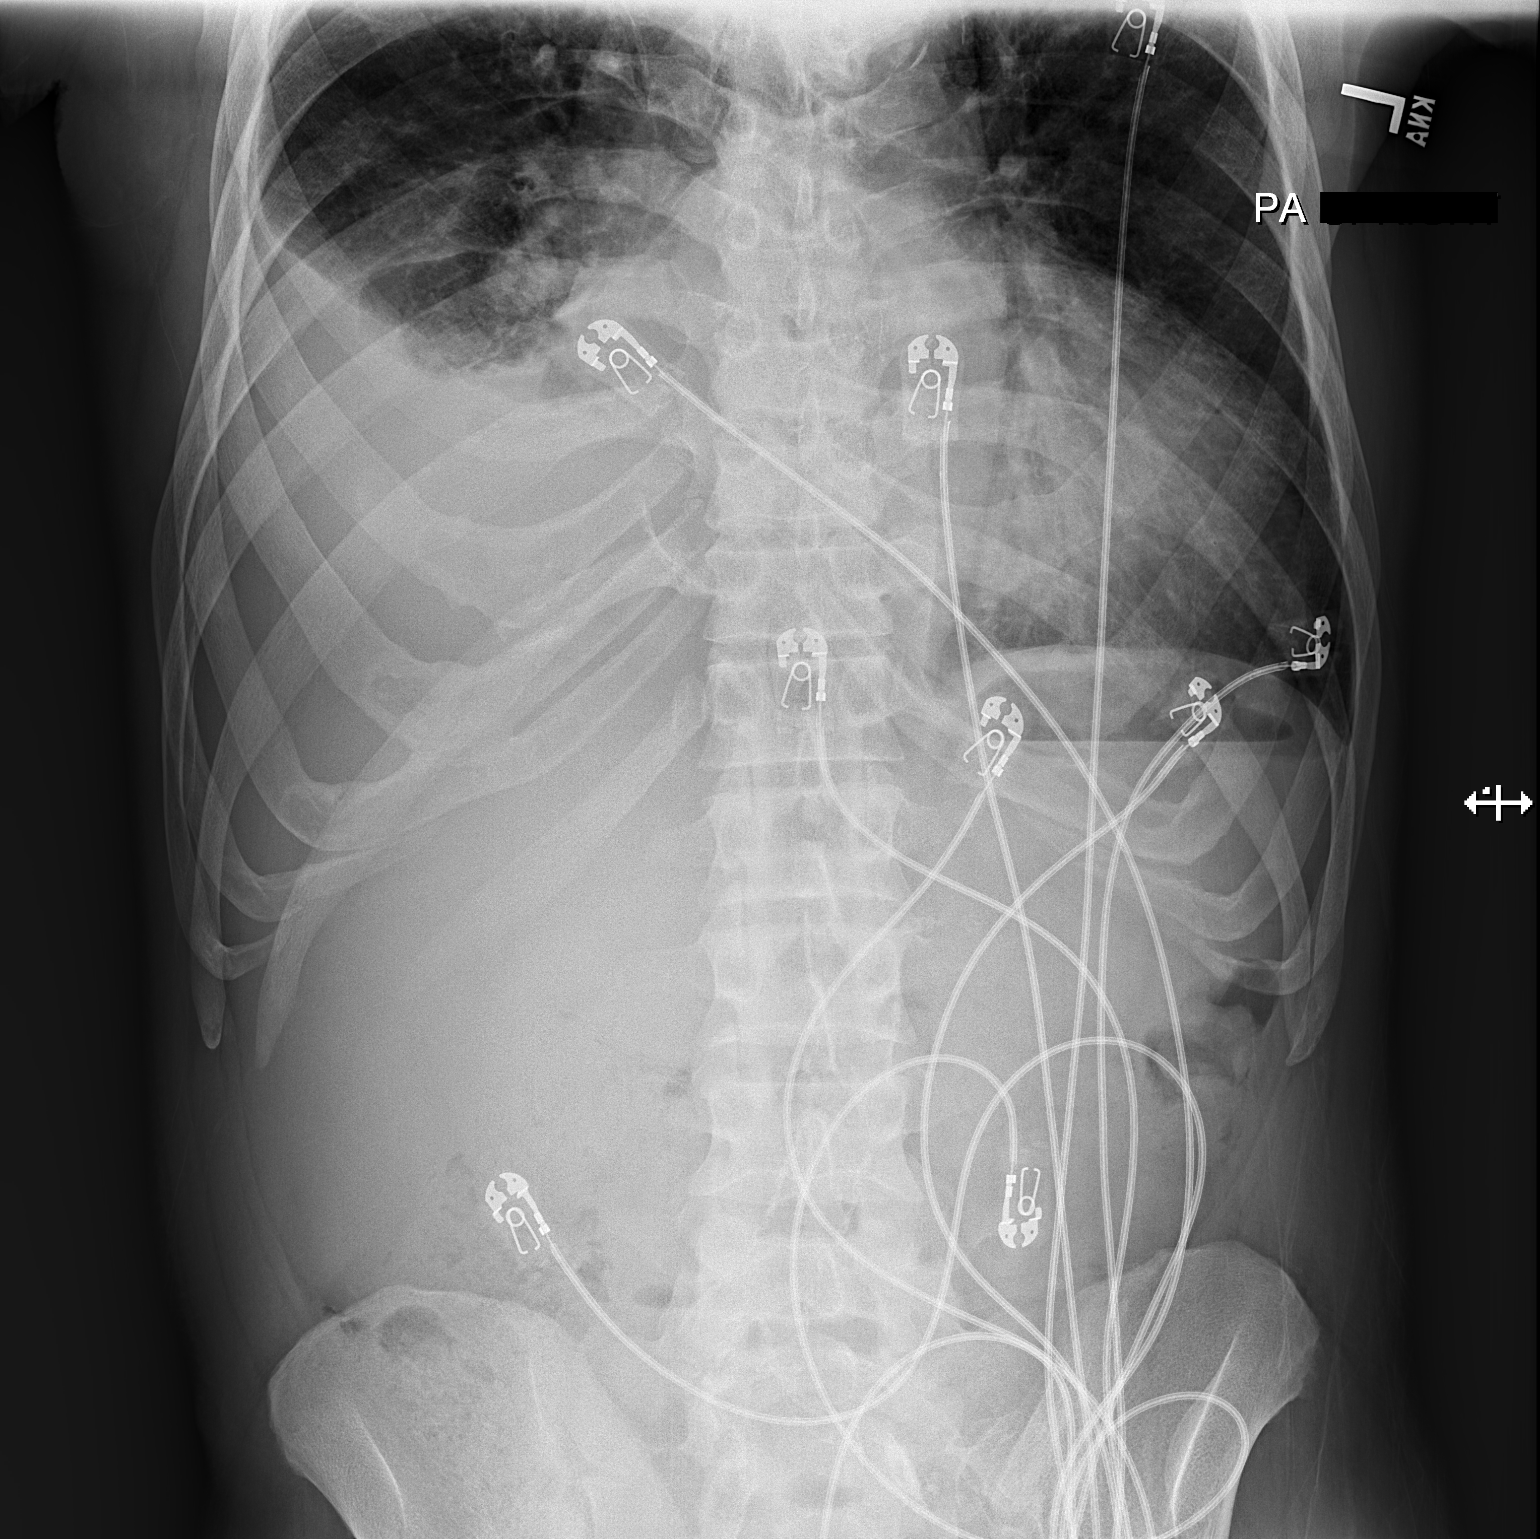

[2 of 2 positions shown; findings below may reference images not displayed]

FINDINGS: Mediastinum and hilar structures normal. Cardiomegaly with pulmonary
venous congestion bilateral interstitial prominence. Right base
atelectasis/consolidation and large right-sided pleural effusion.
Soft tissue structures the abdomen are unremarkable. No bowel
distention or free air.
IMPRESSION: 1. Cardiomegaly with pulmonary venous congestion bilateral
interstitial prominence consistent with CHF.

2. Right base atelectasis/consolidation and large right-sided
pleural effusion.

## 2020-07-15 ENCOUNTER — Other Ambulatory Visit: Payer: Self-pay

## 2020-07-15 ENCOUNTER — Encounter (HOSPITAL_COMMUNITY): Payer: Self-pay

## 2020-07-15 ENCOUNTER — Emergency Department (HOSPITAL_COMMUNITY)
Admission: EM | Admit: 2020-07-15 | Discharge: 2020-07-15 | Disposition: A | Discharge status: Home / Self Care | Payer: Medicare Other | Attending: Emergency Medicine | Admitting: Emergency Medicine

## 2020-07-15 ENCOUNTER — Emergency Department (HOSPITAL_COMMUNITY)
Admission: EM | Admit: 2020-07-15 | Discharge: 2020-07-15 | Disposition: A | Discharge status: Home / Self Care | Payer: Medicare Other | Source: Home / Self Care | Attending: Emergency Medicine | Admitting: Emergency Medicine

## 2020-07-15 DIAGNOSIS — Z7901 Long term (current) use of anticoagulants: Secondary | ICD-10-CM | POA: Insufficient documentation

## 2020-07-15 DIAGNOSIS — M545 Low back pain, unspecified: Secondary | ICD-10-CM | POA: Diagnosis present

## 2020-07-15 DIAGNOSIS — E1122 Type 2 diabetes mellitus with diabetic chronic kidney disease: Secondary | ICD-10-CM | POA: Insufficient documentation

## 2020-07-15 DIAGNOSIS — Z79899 Other long term (current) drug therapy: Secondary | ICD-10-CM | POA: Insufficient documentation

## 2020-07-15 DIAGNOSIS — R1031 Right lower quadrant pain: Secondary | ICD-10-CM | POA: Insufficient documentation

## 2020-07-15 DIAGNOSIS — R1032 Left lower quadrant pain: Secondary | ICD-10-CM | POA: Insufficient documentation

## 2020-07-15 DIAGNOSIS — J441 Chronic obstructive pulmonary disease with (acute) exacerbation: Secondary | ICD-10-CM | POA: Diagnosis not present

## 2020-07-15 DIAGNOSIS — I5042 Chronic combined systolic (congestive) and diastolic (congestive) heart failure: Secondary | ICD-10-CM | POA: Insufficient documentation

## 2020-07-15 DIAGNOSIS — R112 Nausea with vomiting, unspecified: Secondary | ICD-10-CM

## 2020-07-15 DIAGNOSIS — Z992 Dependence on renal dialysis: Secondary | ICD-10-CM | POA: Insufficient documentation

## 2020-07-15 DIAGNOSIS — Z87891 Personal history of nicotine dependence: Secondary | ICD-10-CM | POA: Insufficient documentation

## 2020-07-15 DIAGNOSIS — J449 Chronic obstructive pulmonary disease, unspecified: Secondary | ICD-10-CM | POA: Insufficient documentation

## 2020-07-15 DIAGNOSIS — N186 End stage renal disease: Secondary | ICD-10-CM | POA: Insufficient documentation

## 2020-07-15 DIAGNOSIS — I132 Hypertensive heart and chronic kidney disease with heart failure and with stage 5 chronic kidney disease, or end stage renal disease: Secondary | ICD-10-CM | POA: Insufficient documentation

## 2020-07-15 LAB — CBC WITH DIFFERENTIAL/PLATELET
Abs Immature Granulocytes: 0.03 10*3/uL (ref 0.00–0.07)
Basophils Absolute: 0 10*3/uL (ref 0.0–0.1)
Basophils Relative: 0 %
Eosinophils Absolute: 0.2 10*3/uL (ref 0.0–0.5)
Eosinophils Relative: 3 %
HCT: 27.9 % — ABNORMAL LOW (ref 39.0–52.0)
Hemoglobin: 8.7 g/dL — ABNORMAL LOW (ref 13.0–17.0)
Immature Granulocytes: 1 %
Lymphocytes Relative: 6 %
Lymphs Abs: 0.3 10*3/uL — ABNORMAL LOW (ref 0.7–4.0)
MCH: 27.4 pg (ref 26.0–34.0)
MCHC: 31.2 g/dL (ref 30.0–36.0)
MCV: 88 fL (ref 80.0–100.0)
Monocytes Absolute: 0.6 10*3/uL (ref 0.1–1.0)
Monocytes Relative: 11 %
Neutro Abs: 4 10*3/uL (ref 1.7–7.7)
Neutrophils Relative %: 79 %
Platelets: 137 10*3/uL — ABNORMAL LOW (ref 150–400)
RBC: 3.17 MIL/uL — ABNORMAL LOW (ref 4.22–5.81)
RDW: 17.4 % — ABNORMAL HIGH (ref 11.5–15.5)
WBC: 5 10*3/uL (ref 4.0–10.5)
nRBC: 0 % (ref 0.0–0.2)

## 2020-07-15 LAB — COMPREHENSIVE METABOLIC PANEL
ALT: 11 U/L (ref 0–44)
AST: 18 U/L (ref 15–41)
Albumin: 3.2 g/dL — ABNORMAL LOW (ref 3.5–5.0)
Alkaline Phosphatase: 213 U/L — ABNORMAL HIGH (ref 38–126)
Anion gap: 14 (ref 5–15)
BUN: 61 mg/dL — ABNORMAL HIGH (ref 6–20)
CO2: 26 mmol/L (ref 22–32)
Calcium: 8.6 mg/dL — ABNORMAL LOW (ref 8.9–10.3)
Chloride: 98 mmol/L (ref 98–111)
Creatinine, Ser: 11.74 mg/dL — ABNORMAL HIGH (ref 0.61–1.24)
GFR, Estimated: 5 mL/min — ABNORMAL LOW (ref >60)
Glucose, Bld: 81 mg/dL (ref 70–99)
Potassium: 4.9 mmol/L (ref 3.5–5.1)
Sodium: 138 mmol/L (ref 135–145)
Total Bilirubin: 0.7 mg/dL (ref 0.3–1.2)
Total Protein: 8 g/dL (ref 6.5–8.1)

## 2020-07-15 LAB — LIPASE, BLOOD: Lipase: 35 U/L (ref 11–51)

## 2020-07-15 LAB — CBG MONITORING, ED: Glucose-Capillary: 81 mg/dL (ref 70–99)

## 2020-07-15 IMAGING — DX DG CHEST 2V
2 series · 2 of 2 positions shown · non-contrast
Comparison: Radiographs February 19, 2018.

CLINICAL DATA: Shortness of breath.

EXAM:
CHEST - 2 VIEW

[chest lat]
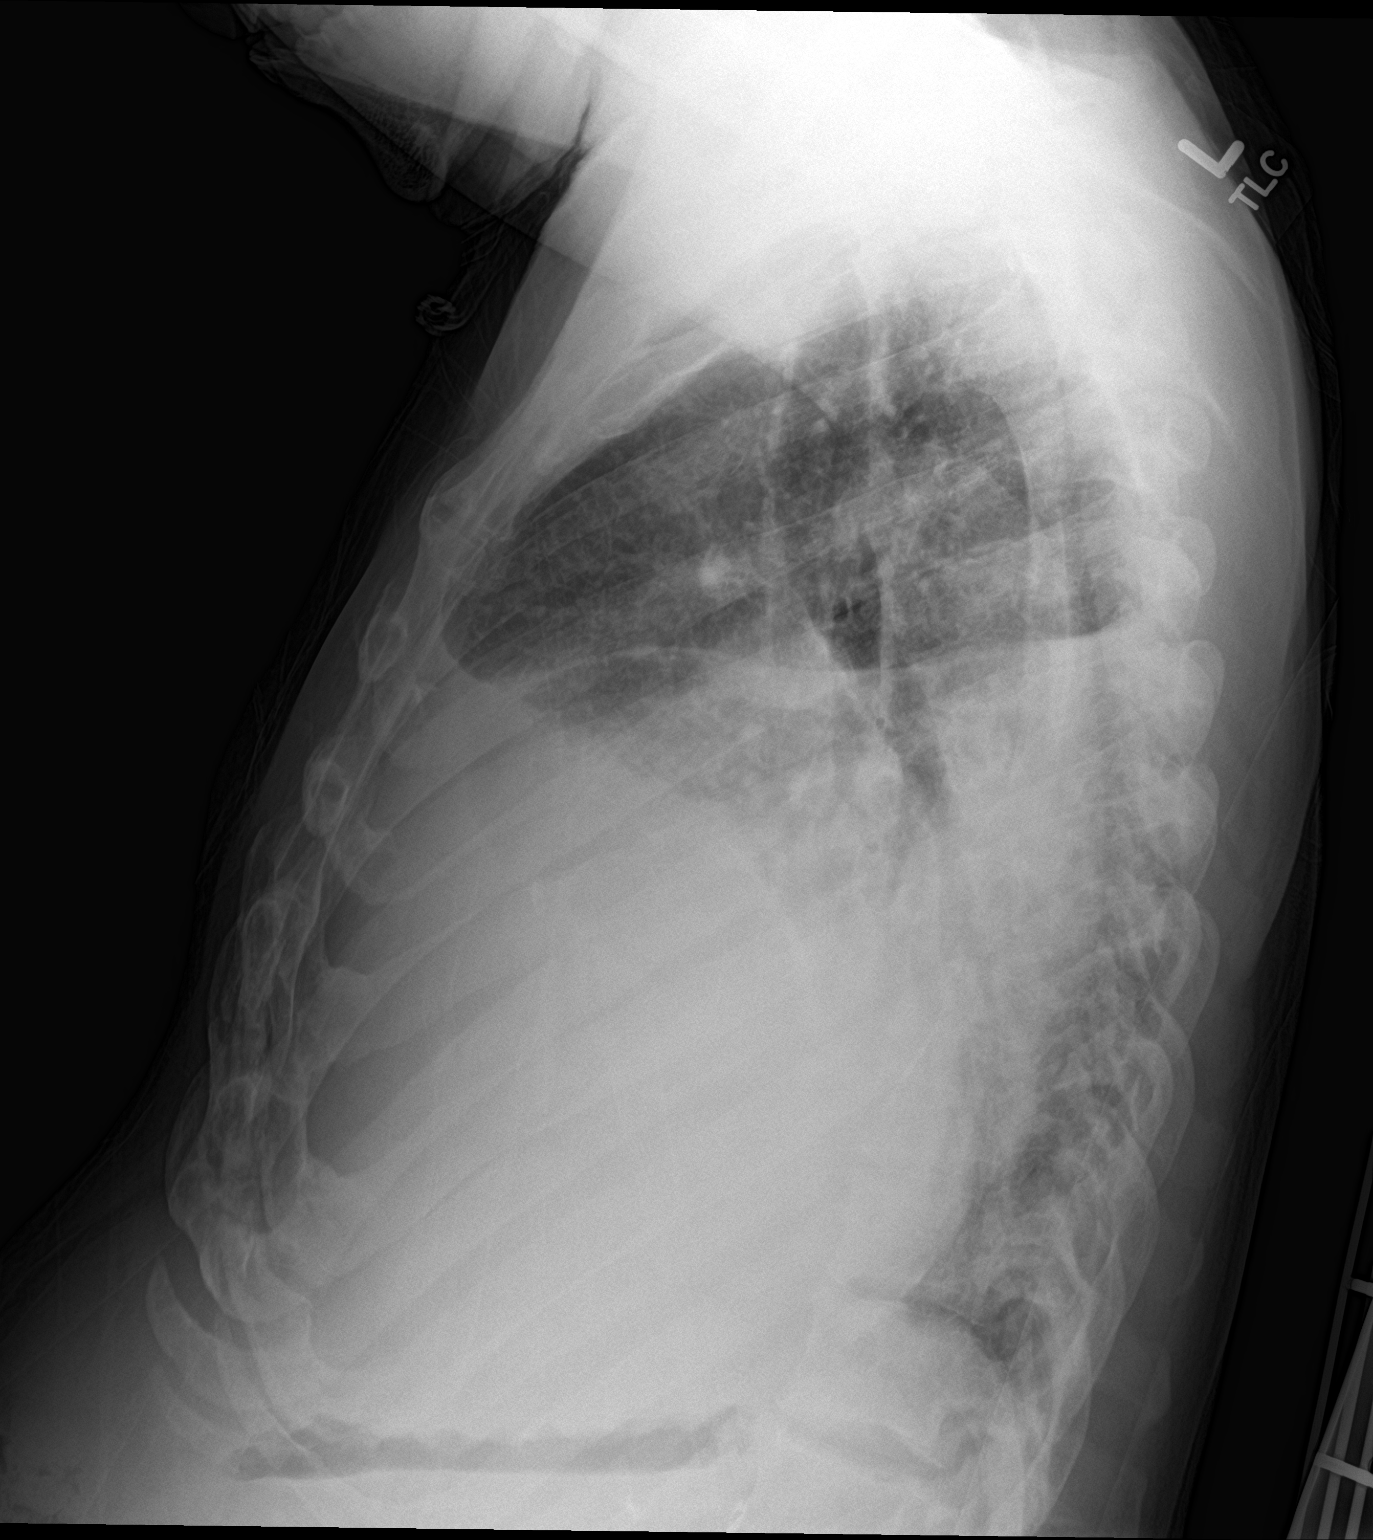

[chest ap]
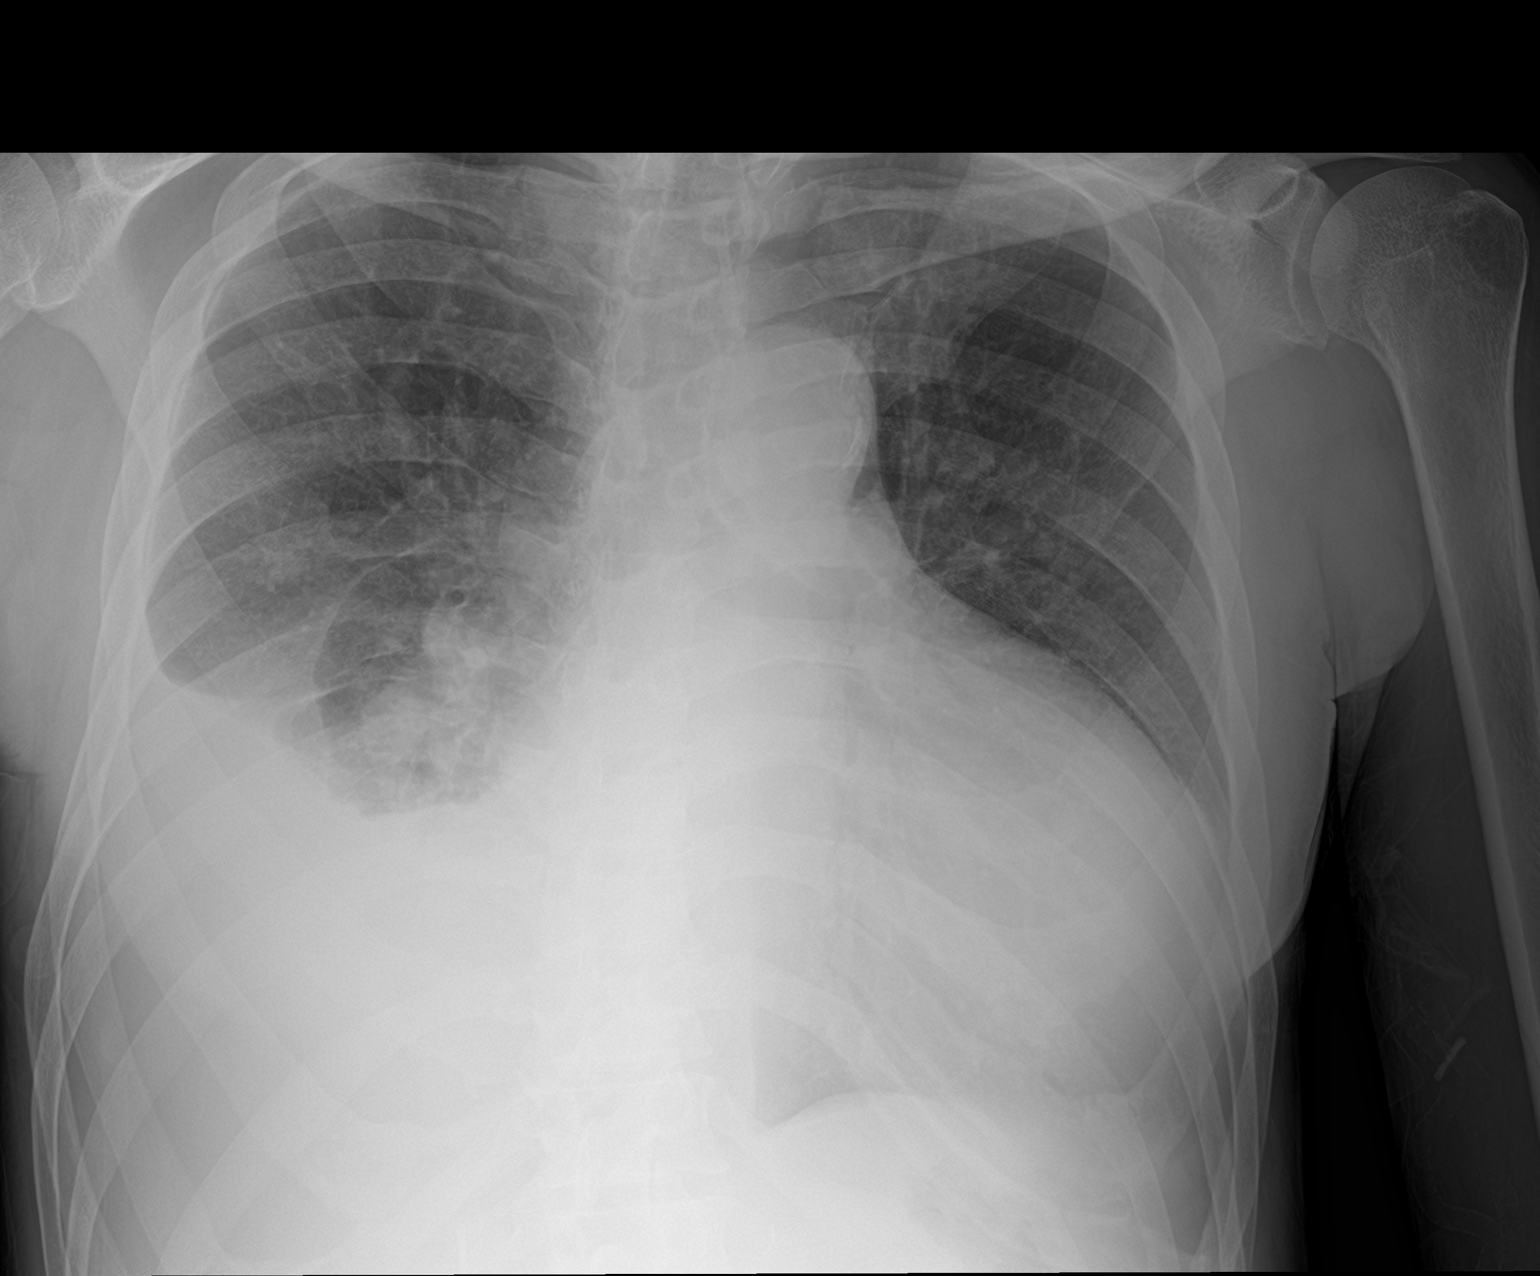

[2 of 2 positions shown; findings below may reference images not displayed]

FINDINGS: Stable cardiomegaly. Atherosclerosis of thoracic aorta is noted. No
pneumothorax is noted. Stable moderate right pleural effusion is
noted with underlying atelectasis or infiltrate. Mild left basilar
atelectasis and possible pleural effusion is noted. Bony thorax is
unremarkable.
IMPRESSION: Stable moderate right pleural effusion with underlying atelectasis
or infiltrate. Mild left basilar atelectasis and possible pleural
effusion is noted.

## 2020-07-15 MED ORDER — OXYCODONE HCL 5 MG PO TABS
10.0000 mg | ORAL_TABLET | Freq: Once | ORAL | Status: AC
  Administered 2020-07-15: 06:00:00 10 mg via ORAL
  Filled 2020-07-15: qty 2

## 2020-07-15 MED ORDER — LORAZEPAM 2 MG/ML IJ SOLN
1.0000 mg | Freq: Once | INTRAMUSCULAR | Status: AC
  Administered 2020-07-15: 12:00:00 1 mg via INTRAVENOUS
  Filled 2020-07-15: qty 1

## 2020-07-15 MED ORDER — PROMETHAZINE HCL 25 MG/ML IJ SOLN: 25.0000 mg | Freq: Once | INTRAMUSCULAR | Status: DC

## 2020-07-15 MED ORDER — CARVEDILOL 3.125 MG PO TABS
25.0000 mg | ORAL_TABLET | Freq: Two times a day (BID) | ORAL | Status: DC
  Filled 2020-07-15: qty 8

## 2020-07-15 MED ORDER — ALUM & MAG HYDROXIDE-SIMETH 200-200-20 MG/5ML PO SUSP
15.0000 mL | Freq: Once | ORAL | Status: AC
  Administered 2020-07-15: 14:00:00 15 mL via ORAL
  Filled 2020-07-15: qty 30

## 2020-07-15 MED ORDER — PROMETHAZINE HCL 25 MG/ML IJ SOLN
25.0000 mg | Freq: Once | INTRAMUSCULAR | Status: AC
  Administered 2020-07-15: 03:00:00 25 mg via INTRAMUSCULAR
  Filled 2020-07-15: qty 1

## 2020-07-15 MED ORDER — AMLODIPINE BESYLATE 5 MG PO TABS
5.0000 mg | ORAL_TABLET | Freq: Every day | ORAL | Status: DC
  Filled 2020-07-15: qty 1

## 2020-07-15 MED ORDER — OXYCODONE HCL 5 MG PO TABS
15.0000 mg | ORAL_TABLET | Freq: Once | ORAL | Status: AC
  Administered 2020-07-15: 15:00:00 15 mg via ORAL
  Filled 2020-07-15: qty 3

## 2020-07-15 MED ORDER — OXYCODONE HCL 5 MG PO TABS
10.0000 mg | ORAL_TABLET | Freq: Once | ORAL | Status: AC
  Administered 2020-07-15: 03:00:00 10 mg via ORAL
  Filled 2020-07-15: qty 2

## 2020-07-15 MED ORDER — HYDRALAZINE HCL 50 MG PO TABS
100.0000 mg | ORAL_TABLET | Freq: Three times a day (TID) | ORAL | Status: DC
  Filled 2020-07-15: qty 2
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
## 2020-07-15 NOTE — Discharge Instructions (Addendum)
Your laboratory results were within normal limits.   Please attend your dialysis treatment tomorrow.
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
## 2020-07-15 NOTE — ED Triage Notes (Signed)
Patient coming from home with multiple complaints, states he is having back pain and abdominal pain, emesis, fluid around his heart, reports last dialysis on Thursday, states symptoms started today.

## 2020-07-15 NOTE — ED Notes (Signed)
Pt resting quietly; no vomiting noted; Phenergan IM and Oxycodone PO given; immediately on receiving Oxycodone, pt begins spitting into emesis bag, saying he has vomited up the Oxycodone and needs more; Dr. Dayna Barker advised.

## 2020-07-15 NOTE — ED Notes (Signed)
Pt asking for pain meds; spoke with Dr. Dayna Barker about this patient; says he will be by to see patient soon.

## 2020-07-15 NOTE — ED Notes (Signed)
Pt resting quietly; per staff members who are familiar with patient; pt does not allow staff to draw blood or insert IVs; staff also report pt is a dialysis patient and does not make urine.

## 2020-07-15 NOTE — ED Notes (Signed)
Pt awake and requesting more pain meds; Dr. Dayna Barker advised.

## 2020-07-15 NOTE — ED Notes (Signed)
Patient with O2 sat of 97%, demanding to be placed on oxygen. 2L [place] applied

## 2020-07-15 NOTE — Progress Notes (Signed)
Spoke with the Nurse and MD concerning this patient's PIV access. Recommended an EJ at this time. Patient has veins in the upper right bicep, however those veins are more appropriate for Midlines. Due to the patient being on dialysis a midline is not appropriate also the patients left arm is restricted. The patient does however have a HD cath and if orders are given from the Renal doctor we could use that for access.

## 2020-07-15 NOTE — ED Notes (Signed)
Pt refused vitals at this time. Pt wants to keep sleeping and does not want to be woken up.

## 2020-07-15 NOTE — ED Notes (Signed)
Pt resting quielty; no distress noted.

## 2020-07-15 NOTE — ED Triage Notes (Signed)
Pt reports emesis while sitting in the lobby after being discharged this morning.

## 2020-08-01 IMAGING — US US THORACENTESIS ASP PLEURAL SPACE W/IMG GUIDE
1 series · 3 of 3 positions shown · non-contrast
Comparison: none

INDICATION: Recurrent symptomatic right pleural effusion. History of CHF and
ESRD. Request for therapeutic thoracentesis.

[Series 1: us thoracentesis asp pleural space w/img guide · 0.26mm/px · 3 of 3 slices shown]
[im 1/3]
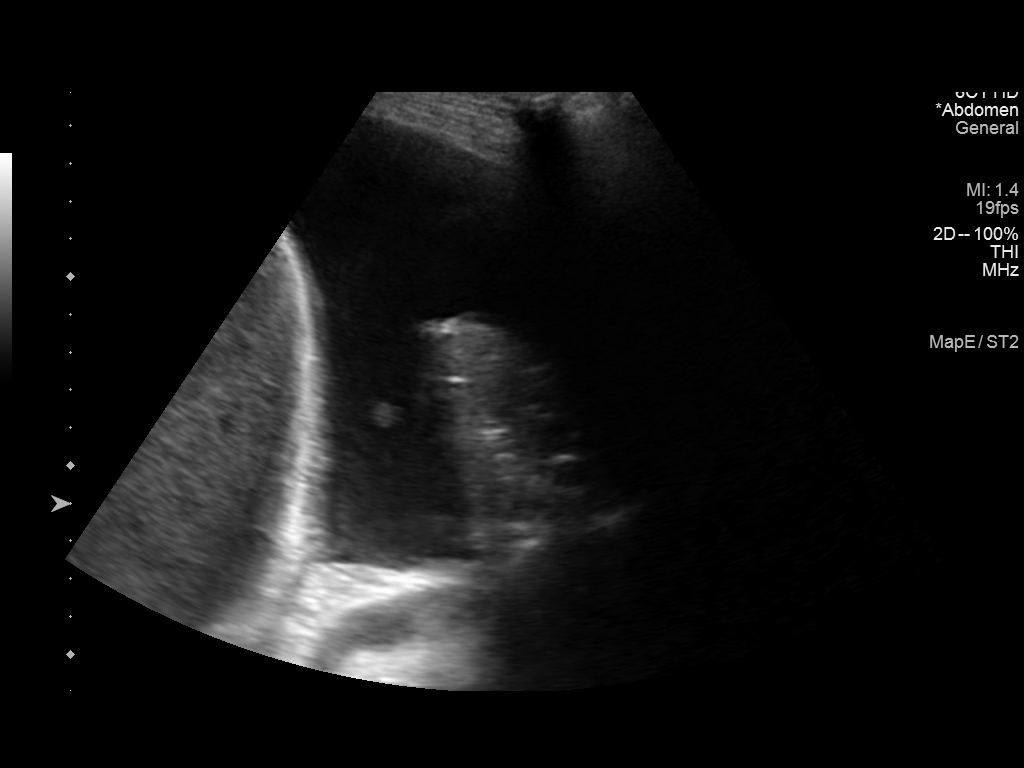
[im 2/3]
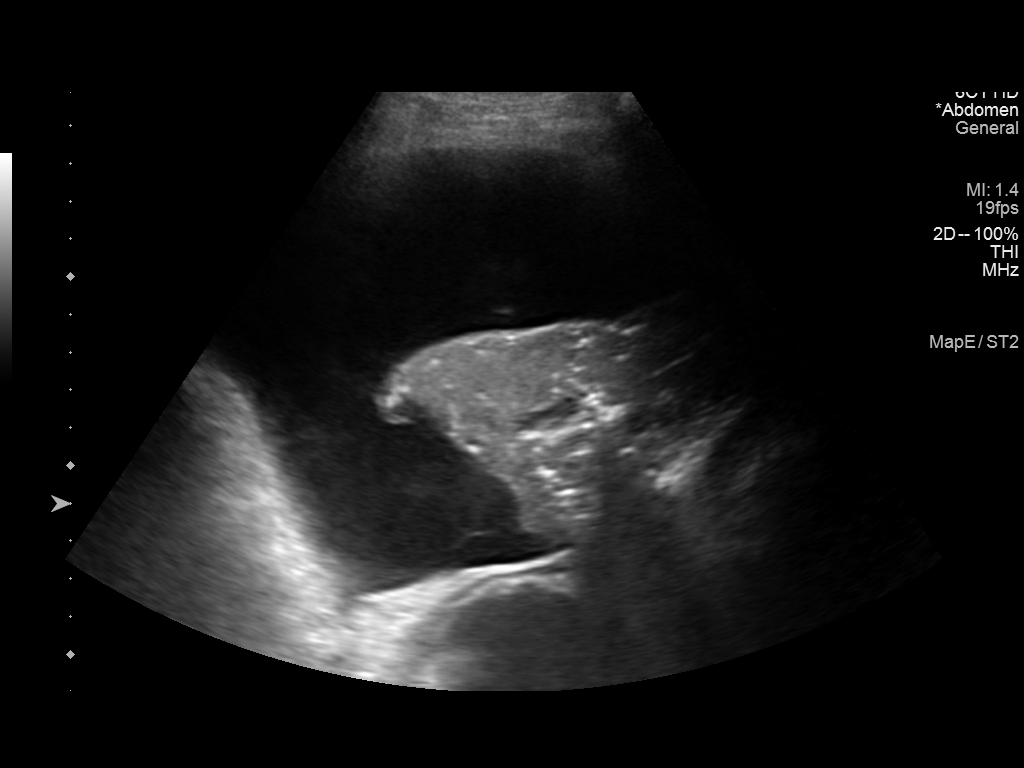
[im 3/3]
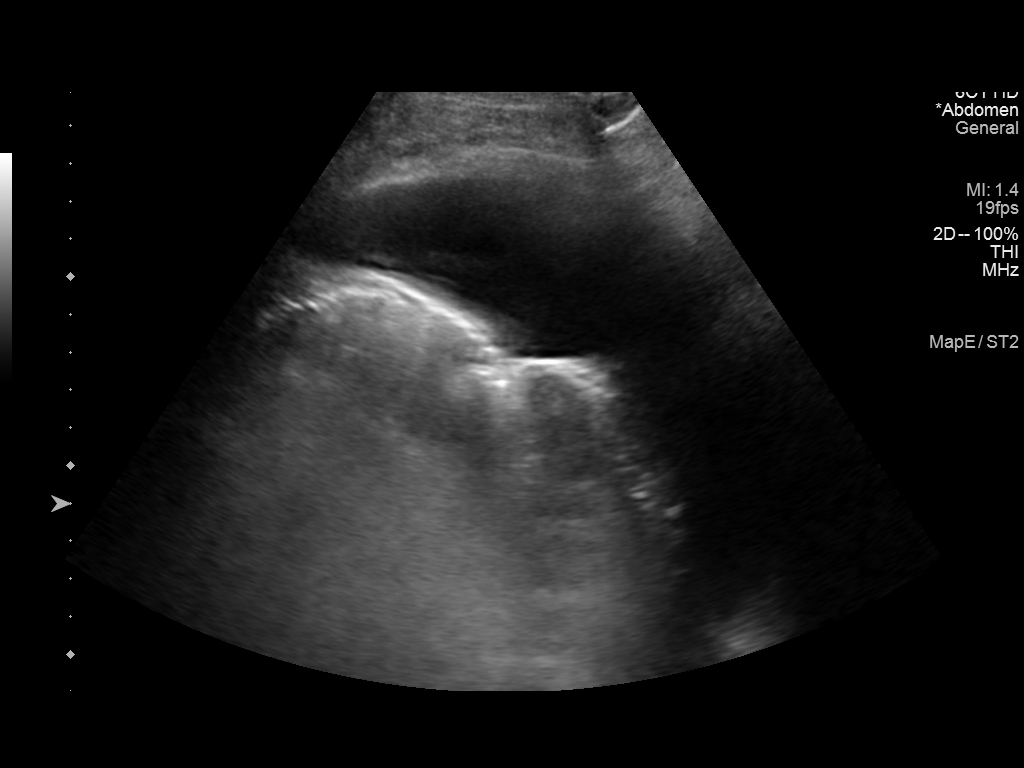

[3 of 3 positions shown; findings below may reference images not displayed]

EXAM:
ULTRASOUND GUIDED RIGHT THORACENTESIS

MEDICATIONS:
15 mL 1% lidocaine.

COMPLICATIONS:
None immediate.

PROCEDURE:
An ultrasound guided thoracentesis was thoroughly discussed with the
patient and questions answered. The benefits, risks, alternatives
and complications were also discussed. The patient understands and
wishes to proceed with the procedure. Written consent was obtained.

Ultrasound was performed to localize and mark an adequate pocket of
fluid in the right chest. The area was then prepped and draped in
the normal sterile fashion. 1% Lidocaine was used for local
anesthesia. Under ultrasound guidance a 6 Fr Safe-T-Centesis
catheter was introduced. Thoracentesis was performed. The catheter
was removed and a dressing applied.
FINDINGS: A total of approximately 1L of golden fluid was removed.
IMPRESSION: Successful ultrasound guided right thoracentesis yielding 1L of
pleural fluid.

Read by Mawere, Nicholas Keetla

## 2020-08-11 ENCOUNTER — Emergency Department (HOSPITAL_BASED_OUTPATIENT_CLINIC_OR_DEPARTMENT_OTHER)
Admission: EM | Admit: 2020-08-11 | Discharge: 2020-08-11 | Disposition: A | Discharge status: Home / Self Care | Payer: Medicare Other | Attending: Emergency Medicine | Admitting: Emergency Medicine

## 2020-08-11 ENCOUNTER — Emergency Department (HOSPITAL_BASED_OUTPATIENT_CLINIC_OR_DEPARTMENT_OTHER): Payer: Medicare Other

## 2020-08-11 ENCOUNTER — Other Ambulatory Visit: Payer: Self-pay

## 2020-08-11 ENCOUNTER — Encounter (HOSPITAL_BASED_OUTPATIENT_CLINIC_OR_DEPARTMENT_OTHER): Payer: Self-pay | Admitting: Emergency Medicine

## 2020-08-11 DIAGNOSIS — I5023 Acute on chronic systolic (congestive) heart failure: Secondary | ICD-10-CM | POA: Diagnosis not present

## 2020-08-11 DIAGNOSIS — J449 Chronic obstructive pulmonary disease, unspecified: Secondary | ICD-10-CM | POA: Insufficient documentation

## 2020-08-11 DIAGNOSIS — I132 Hypertensive heart and chronic kidney disease with heart failure and with stage 5 chronic kidney disease, or end stage renal disease: Secondary | ICD-10-CM | POA: Diagnosis not present

## 2020-08-11 DIAGNOSIS — Z87891 Personal history of nicotine dependence: Secondary | ICD-10-CM | POA: Diagnosis not present

## 2020-08-11 DIAGNOSIS — Z992 Dependence on renal dialysis: Secondary | ICD-10-CM | POA: Insufficient documentation

## 2020-08-11 DIAGNOSIS — R112 Nausea with vomiting, unspecified: Secondary | ICD-10-CM | POA: Insufficient documentation

## 2020-08-11 DIAGNOSIS — E1122 Type 2 diabetes mellitus with diabetic chronic kidney disease: Secondary | ICD-10-CM | POA: Diagnosis not present

## 2020-08-11 DIAGNOSIS — N186 End stage renal disease: Secondary | ICD-10-CM | POA: Diagnosis not present

## 2020-08-11 DIAGNOSIS — R059 Cough, unspecified: Secondary | ICD-10-CM | POA: Diagnosis not present

## 2020-08-11 LAB — CBC WITH DIFFERENTIAL/PLATELET
Abs Immature Granulocytes: 0.06 10*3/uL (ref 0.00–0.07)
Basophils Absolute: 0 10*3/uL (ref 0.0–0.1)
Basophils Relative: 0 %
Eosinophils Absolute: 0.1 10*3/uL (ref 0.0–0.5)
Eosinophils Relative: 1 %
HCT: 26.8 % — ABNORMAL LOW (ref 39.0–52.0)
Hemoglobin: 8.5 g/dL — ABNORMAL LOW (ref 13.0–17.0)
Immature Granulocytes: 1 %
Lymphocytes Relative: 9 %
Lymphs Abs: 0.5 10*3/uL — ABNORMAL LOW (ref 0.7–4.0)
MCH: 30.1 pg (ref 26.0–34.0)
MCHC: 31.7 g/dL (ref 30.0–36.0)
MCV: 95 fL (ref 80.0–100.0)
Monocytes Absolute: 0.5 10*3/uL (ref 0.1–1.0)
Monocytes Relative: 10 %
Neutro Abs: 4 10*3/uL (ref 1.7–7.7)
Neutrophils Relative %: 79 %
Platelets: 127 10*3/uL — ABNORMAL LOW (ref 150–400)
RBC: 2.82 MIL/uL — ABNORMAL LOW (ref 4.22–5.81)
RDW: 18.5 % — ABNORMAL HIGH (ref 11.5–15.5)
WBC: 5.1 10*3/uL (ref 4.0–10.5)
nRBC: 0 % (ref 0.0–0.2)

## 2020-08-11 LAB — COMPREHENSIVE METABOLIC PANEL
ALT: 11 U/L (ref 0–44)
AST: 16 U/L (ref 15–41)
Albumin: 3.3 g/dL — ABNORMAL LOW (ref 3.5–5.0)
Alkaline Phosphatase: 172 U/L — ABNORMAL HIGH (ref 38–126)
Anion gap: 12 (ref 5–15)
BUN: 49 mg/dL — ABNORMAL HIGH (ref 6–20)
CO2: 27 mmol/L (ref 22–32)
Calcium: 8.3 mg/dL — ABNORMAL LOW (ref 8.9–10.3)
Chloride: 99 mmol/L (ref 98–111)
Creatinine, Ser: 7.81 mg/dL — ABNORMAL HIGH (ref 0.61–1.24)
GFR, Estimated: 7 mL/min — ABNORMAL LOW (ref >60)
Glucose, Bld: 86 mg/dL (ref 70–99)
Potassium: 4.6 mmol/L (ref 3.5–5.1)
Sodium: 138 mmol/L (ref 135–145)
Total Bilirubin: 0.7 mg/dL (ref 0.3–1.2)
Total Protein: 7.7 g/dL (ref 6.5–8.1)

## 2020-08-11 LAB — LIPASE, BLOOD: Lipase: 31 U/L (ref 11–51)

## 2020-08-11 NOTE — ED Triage Notes (Signed)
Reports emesis, back pain, cough/body aches, and shortness of breath when ambulating outside.

## 2020-08-11 NOTE — Discharge Instructions (Signed)
Your lab work today was reassuring. You do have some fluid on the lungs and should continue your dialysis treatments. Please follow closely with your primary care doctor. Make your regularly scheduled dialysis appointments. Return to the emergency department any new or suddenly worsening symptoms.

## 2020-08-11 NOTE — ED Notes (Signed)
Patient falling asleep while talking to this nurse while collecting labs. Respirations even and unlabored. No acute distress noted.
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
## 2020-08-18 ENCOUNTER — Emergency Department (HOSPITAL_COMMUNITY): Payer: Medicare Other

## 2020-08-18 ENCOUNTER — Encounter (HOSPITAL_COMMUNITY): Payer: Self-pay

## 2020-08-18 ENCOUNTER — Inpatient Hospital Stay (HOSPITAL_COMMUNITY): Payer: Medicare Other

## 2020-08-18 ENCOUNTER — Inpatient Hospital Stay (HOSPITAL_COMMUNITY)
Admission: EM | Admit: 2020-08-18 | Discharge: 2020-08-18 | DRG: 304 | Discharge status: Against Medical Advice | Payer: Medicare Other | Attending: Internal Medicine | Admitting: Internal Medicine

## 2020-08-18 ENCOUNTER — Other Ambulatory Visit (HOSPITAL_COMMUNITY): Payer: Medicare Other

## 2020-08-18 DIAGNOSIS — Z885 Allergy status to narcotic agent status: Secondary | ICD-10-CM

## 2020-08-18 DIAGNOSIS — I700 Atherosclerosis of aorta: Secondary | ICD-10-CM | POA: Diagnosis present

## 2020-08-18 DIAGNOSIS — I428 Other cardiomyopathies: Secondary | ICD-10-CM | POA: Diagnosis not present

## 2020-08-18 DIAGNOSIS — J9611 Chronic respiratory failure with hypoxia: Secondary | ICD-10-CM | POA: Diagnosis present

## 2020-08-18 DIAGNOSIS — I132 Hypertensive heart and chronic kidney disease with heart failure and with stage 5 chronic kidney disease, or end stage renal disease: Secondary | ICD-10-CM | POA: Diagnosis present

## 2020-08-18 DIAGNOSIS — I169 Hypertensive crisis, unspecified: Secondary | ICD-10-CM

## 2020-08-18 DIAGNOSIS — I5042 Chronic combined systolic (congestive) and diastolic (congestive) heart failure: Secondary | ICD-10-CM | POA: Diagnosis present

## 2020-08-18 DIAGNOSIS — D509 Iron deficiency anemia, unspecified: Secondary | ICD-10-CM | POA: Diagnosis present

## 2020-08-18 DIAGNOSIS — Z992 Dependence on renal dialysis: Secondary | ICD-10-CM

## 2020-08-18 DIAGNOSIS — I160 Hypertensive urgency: Secondary | ICD-10-CM | POA: Diagnosis not present

## 2020-08-18 DIAGNOSIS — E1165 Type 2 diabetes mellitus with hyperglycemia: Secondary | ICD-10-CM

## 2020-08-18 DIAGNOSIS — M25511 Pain in right shoulder: Secondary | ICD-10-CM | POA: Diagnosis present

## 2020-08-18 DIAGNOSIS — Z5329 Procedure and treatment not carried out because of patient's decision for other reasons: Secondary | ICD-10-CM | POA: Diagnosis present

## 2020-08-18 DIAGNOSIS — Z7901 Long term (current) use of anticoagulants: Secondary | ICD-10-CM

## 2020-08-18 DIAGNOSIS — R079 Chest pain, unspecified: Secondary | ICD-10-CM | POA: Diagnosis not present

## 2020-08-18 DIAGNOSIS — Z79899 Other long term (current) drug therapy: Secondary | ICD-10-CM

## 2020-08-18 DIAGNOSIS — Z9115 Patient's noncompliance with renal dialysis: Secondary | ICD-10-CM | POA: Diagnosis not present

## 2020-08-18 DIAGNOSIS — K219 Gastro-esophageal reflux disease without esophagitis: Secondary | ICD-10-CM | POA: Diagnosis present

## 2020-08-18 DIAGNOSIS — Z888 Allergy status to other drugs, medicaments and biological substances status: Secondary | ICD-10-CM

## 2020-08-18 DIAGNOSIS — Z87891 Personal history of nicotine dependence: Secondary | ICD-10-CM | POA: Diagnosis not present

## 2020-08-18 DIAGNOSIS — E1122 Type 2 diabetes mellitus with diabetic chronic kidney disease: Secondary | ICD-10-CM | POA: Diagnosis present

## 2020-08-18 DIAGNOSIS — Z86711 Personal history of pulmonary embolism: Secondary | ICD-10-CM

## 2020-08-18 DIAGNOSIS — R52 Pain, unspecified: Secondary | ICD-10-CM

## 2020-08-18 DIAGNOSIS — N186 End stage renal disease: Secondary | ICD-10-CM | POA: Diagnosis not present

## 2020-08-18 DIAGNOSIS — E1129 Type 2 diabetes mellitus with other diabetic kidney complication: Secondary | ICD-10-CM | POA: Diagnosis not present

## 2020-08-18 DIAGNOSIS — J449 Chronic obstructive pulmonary disease, unspecified: Secondary | ICD-10-CM | POA: Diagnosis present

## 2020-08-18 DIAGNOSIS — R9431 Abnormal electrocardiogram [ECG] [EKG]: Secondary | ICD-10-CM | POA: Diagnosis not present

## 2020-08-18 DIAGNOSIS — N250 Renal osteodystrophy: Secondary | ICD-10-CM | POA: Diagnosis present

## 2020-08-18 DIAGNOSIS — Z86718 Personal history of other venous thrombosis and embolism: Secondary | ICD-10-CM

## 2020-08-18 DIAGNOSIS — Z20822 Contact with and (suspected) exposure to covid-19: Secondary | ICD-10-CM | POA: Diagnosis present

## 2020-08-18 DIAGNOSIS — Z9981 Dependence on supplemental oxygen: Secondary | ICD-10-CM

## 2020-08-18 DIAGNOSIS — G8929 Other chronic pain: Secondary | ICD-10-CM | POA: Diagnosis present

## 2020-08-18 DIAGNOSIS — R778 Other specified abnormalities of plasma proteins: Secondary | ICD-10-CM

## 2020-08-18 DIAGNOSIS — Z886 Allergy status to analgesic agent status: Secondary | ICD-10-CM

## 2020-08-18 DIAGNOSIS — R0602 Shortness of breath: Secondary | ICD-10-CM | POA: Diagnosis present

## 2020-08-18 DIAGNOSIS — IMO0002 Reserved for concepts with insufficient information to code with codable children: Secondary | ICD-10-CM | POA: Diagnosis present

## 2020-08-18 LAB — CBC WITH DIFFERENTIAL/PLATELET
Abs Immature Granulocytes: 0.01 10*3/uL (ref 0.00–0.07)
Basophils Absolute: 0 10*3/uL (ref 0.0–0.1)
Basophils Relative: 1 %
Eosinophils Absolute: 0.3 10*3/uL (ref 0.0–0.5)
Eosinophils Relative: 7 %
HCT: 29.1 % — ABNORMAL LOW (ref 39.0–52.0)
Hemoglobin: 8.9 g/dL — ABNORMAL LOW (ref 13.0–17.0)
Immature Granulocytes: 0 %
Lymphocytes Relative: 14 %
Lymphs Abs: 0.5 10*3/uL — ABNORMAL LOW (ref 0.7–4.0)
MCH: 29.3 pg (ref 26.0–34.0)
MCHC: 30.6 g/dL (ref 30.0–36.0)
MCV: 95.7 fL (ref 80.0–100.0)
Monocytes Absolute: 0.7 10*3/uL (ref 0.1–1.0)
Monocytes Relative: 20 %
Neutro Abs: 2.1 10*3/uL (ref 1.7–7.7)
Neutrophils Relative %: 58 %
Platelets: 171 10*3/uL (ref 150–400)
RBC: 3.04 MIL/uL — ABNORMAL LOW (ref 4.22–5.81)
RDW: 18.6 % — ABNORMAL HIGH (ref 11.5–15.5)
WBC: 3.5 10*3/uL — ABNORMAL LOW (ref 4.0–10.5)
nRBC: 0 % (ref 0.0–0.2)

## 2020-08-18 LAB — BASIC METABOLIC PANEL
Anion gap: 10 (ref 5–15)
BUN: 37 mg/dL — ABNORMAL HIGH (ref 6–20)
CO2: 28 mmol/L (ref 22–32)
Calcium: 8.4 mg/dL — ABNORMAL LOW (ref 8.9–10.3)
Chloride: 99 mmol/L (ref 98–111)
Creatinine, Ser: 7.84 mg/dL — ABNORMAL HIGH (ref 0.61–1.24)
GFR, Estimated: 7 mL/min — ABNORMAL LOW (ref >60)
Glucose, Bld: 76 mg/dL (ref 70–99)
Potassium: 4.3 mmol/L (ref 3.5–5.1)
Sodium: 137 mmol/L (ref 135–145)

## 2020-08-18 LAB — TROPONIN I (HIGH SENSITIVITY)
Troponin I (High Sensitivity): 24 ng/L — ABNORMAL HIGH (ref <18)
Troponin I (High Sensitivity): 22 ng/L — ABNORMAL HIGH (ref <18)

## 2020-08-18 LAB — SARS CORONAVIRUS 2 BY RT PCR (HOSPITAL ORDER, PERFORMED IN ~~LOC~~ HOSPITAL LAB): SARS Coronavirus 2: NEGATIVE

## 2020-08-18 LAB — CBG MONITORING, ED: Glucose-Capillary: 79 mg/dL (ref 70–99)

## 2020-08-18 MED ORDER — HYDRALAZINE HCL 20 MG/ML IJ SOLN
10.0000 mg | Freq: Once | INTRAMUSCULAR | Status: AC
  Administered 2020-08-18: 10 mg via INTRAVENOUS
  Filled 2020-08-18: qty 1

## 2020-08-18 MED ORDER — AMLODIPINE BESYLATE 5 MG PO TABS
5.0000 mg | ORAL_TABLET | Freq: Every day | ORAL | Status: DC
  Administered 2020-08-18: 5 mg via ORAL
  Filled 2020-08-18: qty 1

## 2020-08-18 MED ORDER — CARVEDILOL 12.5 MG PO TABS: 25.0000 mg | ORAL_TABLET | Freq: Two times a day (BID) | ORAL | Status: DC

## 2020-08-18 MED ORDER — LABETALOL HCL 5 MG/ML IV SOLN: 10.0000 mg | INTRAVENOUS | Status: DC | PRN

## 2020-08-18 MED ORDER — HYDROMORPHONE HCL 1 MG/ML IJ SOLN
0.5000 mg | INTRAMUSCULAR | Status: DC | PRN
  Administered 2020-08-18 (×3): 0.5 mg via INTRAVENOUS
  Filled 2020-08-18 (×3): qty 1

## 2020-08-18 MED ORDER — LANTHANUM CARBONATE 500 MG PO CHEW
1000.0000 mg | CHEWABLE_TABLET | Freq: Three times a day (TID) | ORAL | Status: DC
  Filled 2020-08-18: qty 2

## 2020-08-18 MED ORDER — CYCLOBENZAPRINE HCL 10 MG PO TABS: 10.0000 mg | ORAL_TABLET | Freq: Three times a day (TID) | ORAL | Status: DC | PRN

## 2020-08-18 MED ORDER — SODIUM CHLORIDE 0.9% FLUSH
3.0000 mL | Freq: Two times a day (BID) | INTRAVENOUS | Status: DC
  Administered 2020-08-18: 3 mL via INTRAVENOUS

## 2020-08-18 MED ORDER — UMECLIDINIUM BROMIDE 62.5 MCG/INH IN AEPB: 1.0000 | INHALATION_SPRAY | Freq: Every day | RESPIRATORY_TRACT | Status: DC

## 2020-08-18 MED ORDER — APIXABAN 5 MG PO TABS
5.0000 mg | ORAL_TABLET | Freq: Two times a day (BID) | ORAL | Status: DC
Start: 2020-08-18 — End: 2020-08-18

## 2020-08-18 MED ORDER — TEMAZEPAM 15 MG PO CAPS: 15.0000 mg | ORAL_CAPSULE | Freq: Every evening | ORAL | Status: DC | PRN

## 2020-08-18 MED ORDER — TRIMETHOBENZAMIDE HCL 300 MG PO CAPS: 300.0000 mg | ORAL_CAPSULE | ORAL | Status: DC

## 2020-08-18 MED ORDER — APIXABAN 5 MG PO TABS
5.0000 mg | ORAL_TABLET | Freq: Two times a day (BID) | ORAL | Status: DC
  Filled 2020-08-18: qty 1

## 2020-08-18 MED ORDER — FENTANYL CITRATE (PF) 100 MCG/2ML IJ SOLN
100.0000 ug | Freq: Once | INTRAMUSCULAR | Status: AC
  Administered 2020-08-18: 100 ug via INTRAVENOUS
  Filled 2020-08-18: qty 2

## 2020-08-18 MED ORDER — NITROGLYCERIN IN D5W 200-5 MCG/ML-% IV SOLN
0.0000 ug/min | INTRAVENOUS | Status: DC
  Administered 2020-08-18: 5 ug/min via INTRAVENOUS
  Filled 2020-08-18: qty 250

## 2020-08-18 MED ORDER — FENTANYL CITRATE (PF) 100 MCG/2ML IJ SOLN
50.0000 ug | INTRAMUSCULAR | Status: DC | PRN
  Administered 2020-08-18: 50 ug via INTRAVENOUS
  Filled 2020-08-18: qty 2

## 2020-08-18 NOTE — ED Triage Notes (Addendum)
Pt presents from home, c/o CP, SOB and n/v that woke him up, MWF dialysis and had dialysis on Wednesday, states he wears 2L [place] at baseline

## 2020-08-18 NOTE — ED Provider Notes (Signed)
Accepted handoff at shift change from [facility]. Please see prior provider note for more detail.   Briefly: Patient is 55 y.o. CHF ESRD on HD MWF.  Last dialysis Wednesday.  Woke this morning feeling short of breath blood pressure is elevated to 220s/110s with tachypnea, evidence of fluid overload on chest x-ray and need for admission.  Plan to follow-up on labs and admit.  Physical Exam  BP (!) 204/118   Pulse 91   Temp 98 F (36.7 C) (Oral)   Resp (!) 21   Ht _0  (1.88 m)   Wt 81.6 kg   SpO2 94%   BMI 23.11 kg/m   Physical Exam  ED Course/Procedures     Procedures Results for orders placed or performed during the hospital encounter of 08/18/20  CBC with Differential/Platelet  Result Value Ref Range   WBC 3.5 (L) 4.0 - 10.5 K/uL   RBC 3.04 (L) 4.22 - 5.81 MIL/uL   Hemoglobin 8.9 (L) 13.0 - 17.0 g/dL   HCT 29.1 (L) 39.0 - 52.0 %   MCV 95.7 80.0 - 100.0 fL   MCH 29.3 26.0 - 34.0 pg   MCHC 30.6 30.0 - 36.0 g/dL   RDW 18.6 (H) 11.5 - 15.5 %   Platelets 171 150 - 400 K/uL   nRBC 0.0 0.0 - 0.2 %   Neutrophils Relative % 58 %   Neutro Abs 2.1 1.7 - 7.7 K/uL   Lymphocytes Relative 14 %   Lymphs Abs 0.5 (L) 0.7 - 4.0 K/uL   Monocytes Relative 20 %   Monocytes Absolute 0.7 0.1 - 1.0 K/uL   Eosinophils Relative 7 %   Eosinophils Absolute 0.3 0.0 - 0.5 K/uL   Basophils Relative 1 %   Basophils Absolute 0.0 0.0 - 0.1 K/uL   Immature Granulocytes 0 %   Abs Immature Granulocytes 0.01 0.00 - 0.07 K/uL  Basic metabolic panel  Result Value Ref Range   Sodium 137 135 - 145 mmol/L   Potassium 4.3 3.5 - 5.1 mmol/L   Chloride 99 98 - 111 mmol/L   CO2 28 22 - 32 mmol/L   Glucose, Bld 76 70 - 99 mg/dL   BUN 37 (H) 6 - 20 mg/dL   Creatinine, Ser 7.84 (H) 0.61 - 1.24 mg/dL   Calcium 8.4 (L) 8.9 - 10.3 mg/dL   GFR, Estimated 7 (L) >60 mL/min   Anion gap 10 5 - 15  Troponin I (High Sensitivity)  Result Value Ref Range   Troponin I (High Sensitivity) 24 (H) <18 ng/L   DG  Chest Port 1 View  Result Date: 08/18/2020 CLINICAL DATA:  55 year old male who awoke with mid chest pain, shortness of breath. EXAM: PORTABLE CHEST 1 VIEW COMPARISON:  Portable chest 08/11/2020 and earlier. FINDINGS: Portable AP semi upright view at 0638 hours. Stable cardiomegaly and mediastinal contours. Stable left chest dual lumen dialysis type catheter. Stable lung volumes, mild diffuse increased interstitial opacity and patchy chronic appearing right lung opacity favored due to fibrothorax on the basis of chest CTA 03/09/2020. No pneumothorax or new pulmonary opacity. Stable visualized osseous structures. Paucity of bowel gas in the upper abdomen. IMPRESSION: 1. Chronic cardiomegaly and right lung fibrothorax. 2. Pulmonary vascular congestion, mild or developing interstitial edema is difficult to exclude. Electronically Signed   By: Genevie Ann M.D.   On: 08/18/2020 06:59   DG Chest Portable 1 View  Result Date: 08/11/2020 CLINICAL DATA:  Shortness of breath EXAM: PORTABLE CHEST 1 VIEW COMPARISON:  June 10, 2020 FINDINGS: There is a well-positioned tunneled dialysis catheter. The heart size is significantly enlarged. There is a growing right-sided pleural effusion. There is vascular congestion with interstitial edema. There is a small left-sided pleural effusion. There is an opacity at the right lung base favored to represent an area of atelectasis. Aortic calcifications are noted. IMPRESSION: 1. Cardiomegaly with vascular congestion and mild interstitial edema. 2. Growing bilateral pleural effusions, right greater than left. 3. Persistent opacities at the right lung base favored to represent atelectasis. Electronically Signed   By: Constance Holster M.D.   On: 08/11/2020 05:09    MDM   Chest x-ray with evidence of chronic lung disease as well as new interstitial pulmonary edema.  CBC with leukopenia and anemia likely secondary to patient's chronic disease.  BMP without any significant  abnormalities in usual.  Potassium is also normal limits.  Troponin x1 is 24, the second troponin is pending. Covid test pending. EKG notable for prolonged QT. Will need to hold all QT prolonging drugs. Discussed with Dr. Neysa Bonito of hospitalist who will admit.  Will discuss with nephrology.  Consult placed 7:37 AM    Tedd Sias, [place] 08/18/20 [id number]    Dorie Rank, MD 08/19/20 [phone]

## 2020-08-18 NOTE — Discharge Summary (Signed)
AMA NOTE    Patient ID: Frank [name] MRN: [id number] DOB/AGE: Jul 31, 1965 55 y.o. Primary Care Physician:  [place] date: 08/18/2020 Discharge date: 08/18/2020   PLEASE NOTE THAT PATIENT LEFT AGAINST MEDICAL ADVICE. Risks of hypertensive emergency, volume overload, chest pain, SOB, death, recurrence of shoulder pain were explained in detail to the patient and he/she verbally understood the risks of leaving AMA.   Discharge Diagnoses:   Present on Admission: . Chest pain . Hypertensive urgency . Prolonged QT interval . DM (diabetes mellitus), type 2, uncontrolled, with renal complications ([place]) . History of pulmonary embolism   Brief H and P: This is a 55 year old male with past medical history of chronic combined systolic and diastolic heart failure, ESRD on HD (M, W, F), type 2 diabetes, COPD with chronic hypoxic respiratory failure on 2 L/min at baseline, DVT and PE, hypertension, aortic atherosclerosis, frequent ED visits who presented to the ED with right shoulder pain and shortness of breath that woke him last night. Additionally with nausea and vomiting.  Upon presentation to bedside, patient was standing up at the side of the bed and stated he is short of breath with lying down.  He was not wearing O2 at this time and SPO2 was 95%.  Patient reported chest pain to the ED provider on presentation but denied chest pain to the admitting hospitalist. States that he was in his usual state of health until his symptoms occurred. Has been unable to keep any of his medications down due to his nausea/vomiting at home.  Most recent dialysis treatment was Wednesday and gets dialysis at [facility].  Currently refuses to go to [facility] for any HD treatment and says he will follow up with [facility] for his HD after discharge.  After explanation that he would likely need dialysis today at [facility], the patient stated he would prefer to be treated here for his shoulder pain and BP then follow-up at [facility] for  HD and again refused to go to [facility].  He states that his most concerning symptom is his right shoulder pain.  Denies any trauma to the area.  He has pain with any active ROM of the shoulder.  ED Course: Afebrile, hypertensive 224/76 on presentation and currently 204/118 (obtained from ankle while standing - patient refusing to check upper extremity BP so likely inaccurate). Notable Labs: Na 137, K 4.3, BUN 37, Cr 7.84, Trop 24, WBC 3.5, Hb 8.9, COVID pending. Notable Imaging: CXR - cardiomegaly, right lung fibrothorax, pulmonary vascular congestion and mild or developing interstitial edema. Patient received Nitroglycerin drip due to prolonged QTc prohibiting Cardene drip.    BP improved with IV antihypertensives and shoulder pain improved with IV opiates. Unclear etiology of his symptoms. Full workup was not completed. Patient is leaving AMA to go to [facility] for HD. See H&P for further details   Consults:  nephrology   Recommendations for Outpatient Follow-up: nephrology  Allergies:   Allergies  Allergen Reactions  . Butalbital Anaphylaxis and Other (See Comments)  . Butalbital-Apap-Caffeine Shortness Of Breath, Swelling and Other (See Comments)    Swelling in throat  . Minoxidil Shortness Of Breath and Other (See Comments)  . Na Ferric Gluc Cplx In Sucrose Shortness Of Breath, Swelling and Other (See Comments)    Swelling in throat, tolerates Venofor  . Tylenol [Acetaminophen] Anaphylaxis and Swelling  . Darvocet [Propoxyphene N-Acetaminophen] Hives    Discharge Medications: Please note that patient left AMA (against medical advice)   Hospital Course:  Principal Problem:   Hypertensive urgency Active Problems:   History of pulmonary embolism   Prolonged QT interval   DM (diabetes mellitus), type 2, uncontrolled, with renal complications (H[facility])   Nonischemic cardiomyopathy ([place])   End-stage renal disease on hemodialysis ([place])   Dialysis patient, noncompliant ([place])   Chest pain   Day  of Discharge BP (!) 149/79 (BP Location: Left Wrist)   Pulse 75   Temp 98 F (36.7 C) (Oral)   Resp 17   Ht _0  (1.88 m)   Wt 81.6 kg   SpO2 98%   BMI 23.11 kg/m   Physical Exam: Physical Exam See admit from today    The results of significant diagnostics from this hospitalization (including imaging, microbiology, ancillary and laboratory) are listed below for reference.    LAB RESULTS: Basic Metabolic Panel: Recent Labs  Lab 08/18/20 0540  NA 137  K 4.3  CL 99  CO2 28  GLUCOSE 76  BUN 37*  CREATININE 7.84*  CALCIUM 8.4*   Liver Function Tests: No results for input(s): AST, ALT, ALKPHOS, BILITOT, PROT, ALBUMIN in the last 168 hours. No results for input(s): LIPASE, AMYLASE in the last 168 hours. No results for input(s): AMMONIA in the last 168 hours. CBC: Recent Labs  Lab 08/18/20 0540  WBC 3.5*  NEUTROABS 2.1  HGB 8.9*  HCT 29.1*  MCV 95.7  PLT 171   Cardiac Enzymes: No results for input(s): CKTOTAL, CKMB, CKMBINDEX, TROPONINI in the last 168 hours. BNP: Invalid input(s): POCBNP CBG: Recent Labs  Lab 08/18/20 0803  GLUCAP 79    Significant Diagnostic Studies:  DG Chest Port 1 View  Result Date: 08/18/2020 CLINICAL DATA:  55 year old male who awoke with mid chest pain, shortness of breath. EXAM: PORTABLE CHEST 1 VIEW COMPARISON:  Portable chest 08/11/2020 and earlier. FINDINGS: Portable AP semi upright view at 0638 hours. Stable cardiomegaly and mediastinal contours. Stable left chest dual lumen dialysis type catheter. Stable lung volumes, mild diffuse increased interstitial opacity and patchy chronic appearing right lung opacity favored due to fibrothorax on the basis of chest CTA 03/09/2020. No pneumothorax or new pulmonary opacity. Stable visualized osseous structures. Paucity of bowel gas in the upper abdomen. IMPRESSION: 1. Chronic cardiomegaly and right lung fibrothorax. 2. Pulmonary vascular congestion, mild or developing interstitial edema is  difficult to exclude. Electronically Signed   By: Genevie Ann M.D.   On: 08/18/2020 06:59    2D ECHO:   Disposition and Follow-up:    DISPOSITION: Patient left AMA. He/ she was advised to seek follow-up with primary care physician.     DISCHARGE FOLLOW-UP    Time spent on Discharge: <30 minutes  Signed:   Harold Hedge D.O. [facility] 08/18/2020, 2:32 PM

## 2020-08-18 NOTE — ED Notes (Signed)
MD requested arm BP for accuracy instead of ankle BP, pt refused.
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
## 2020-08-18 NOTE — Progress Notes (Signed)
Consulted to place PIV. Per care Rn current IV is working, will re enter consult if necessary.

## 2020-08-18 NOTE — ED Notes (Addendum)
Pt stated he is leaving AMA and would like his IV removed. MD aware, spoke with pt regarding his decision. IV removed and wheelchair provided. Pt refused D/C vitals.
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
## 2020-08-27 IMAGING — CR DG ABDOMEN ACUTE W/ 1V CHEST
3 series · 3 of 3 positions shown · non-contrast
Comparison: Chest 03/13/2018

CLINICAL DATA: Left flank pain and urinary retention for 4 days.
Dialysis on [DATE]. Vomiting since dialysis.

EXAM:
DG ABDOMEN ACUTE W/ 1V CHEST

[w abdomen decub]
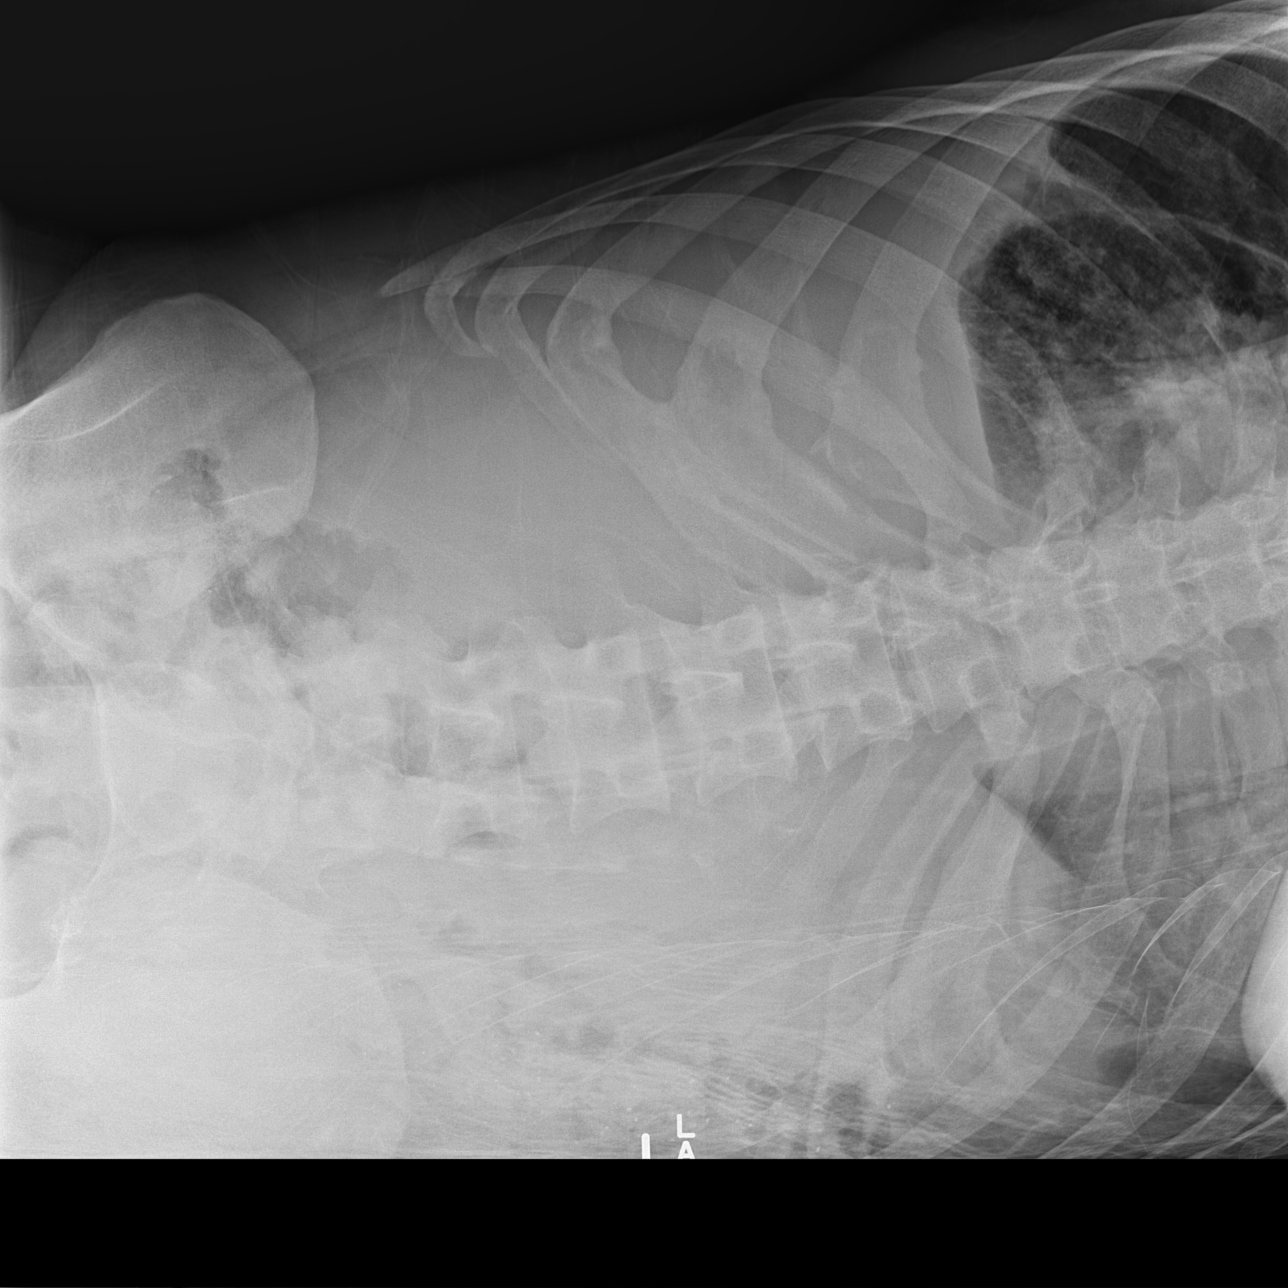

[x abdomen supine]
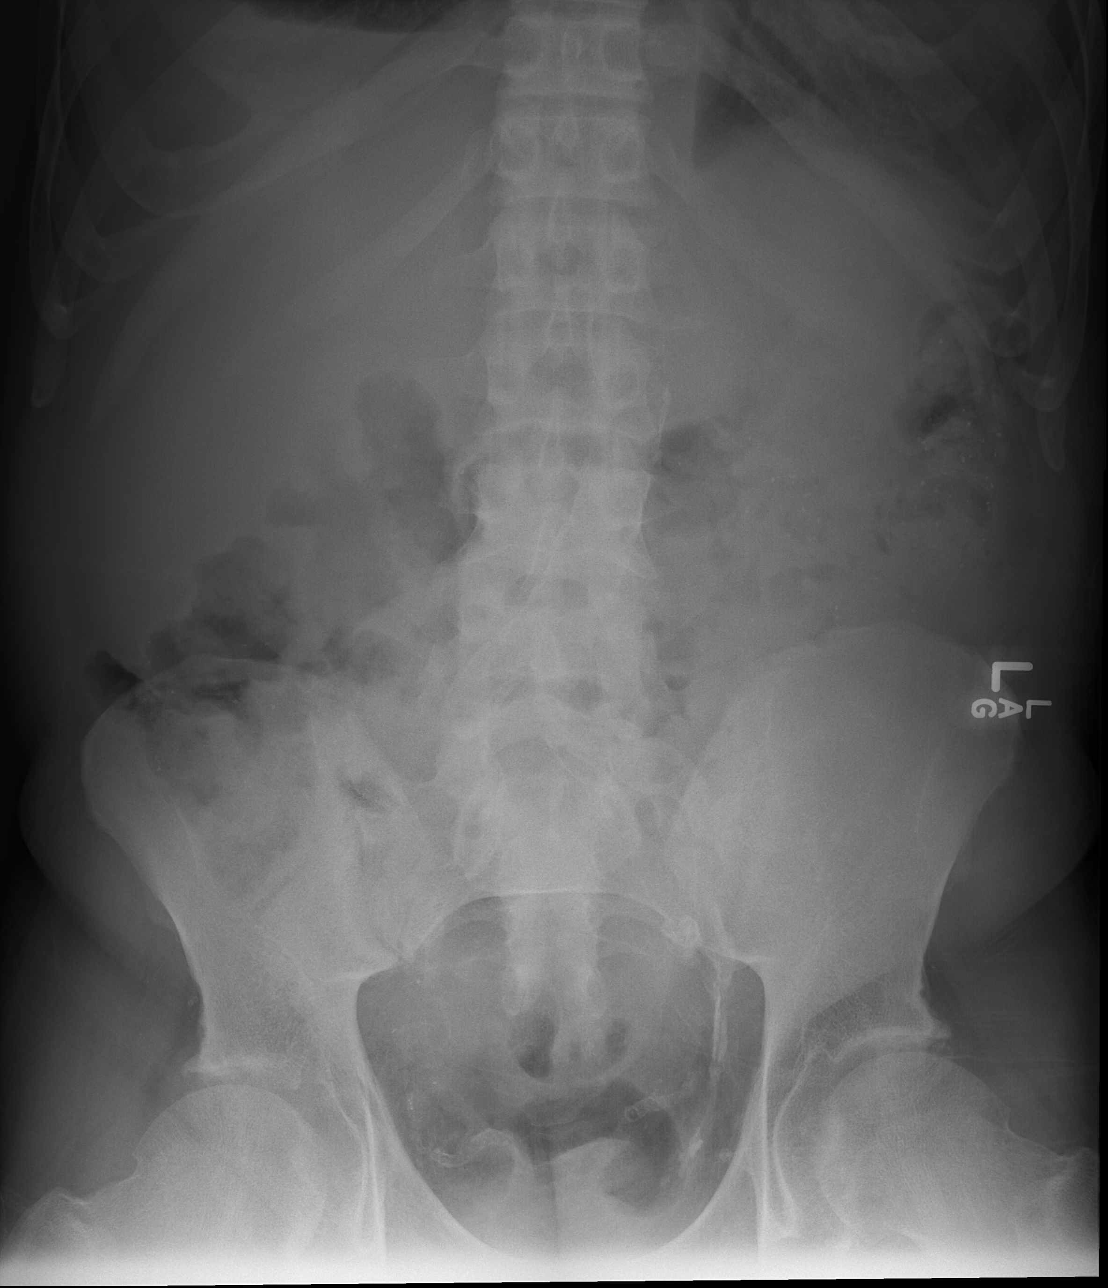

[x chest ap]
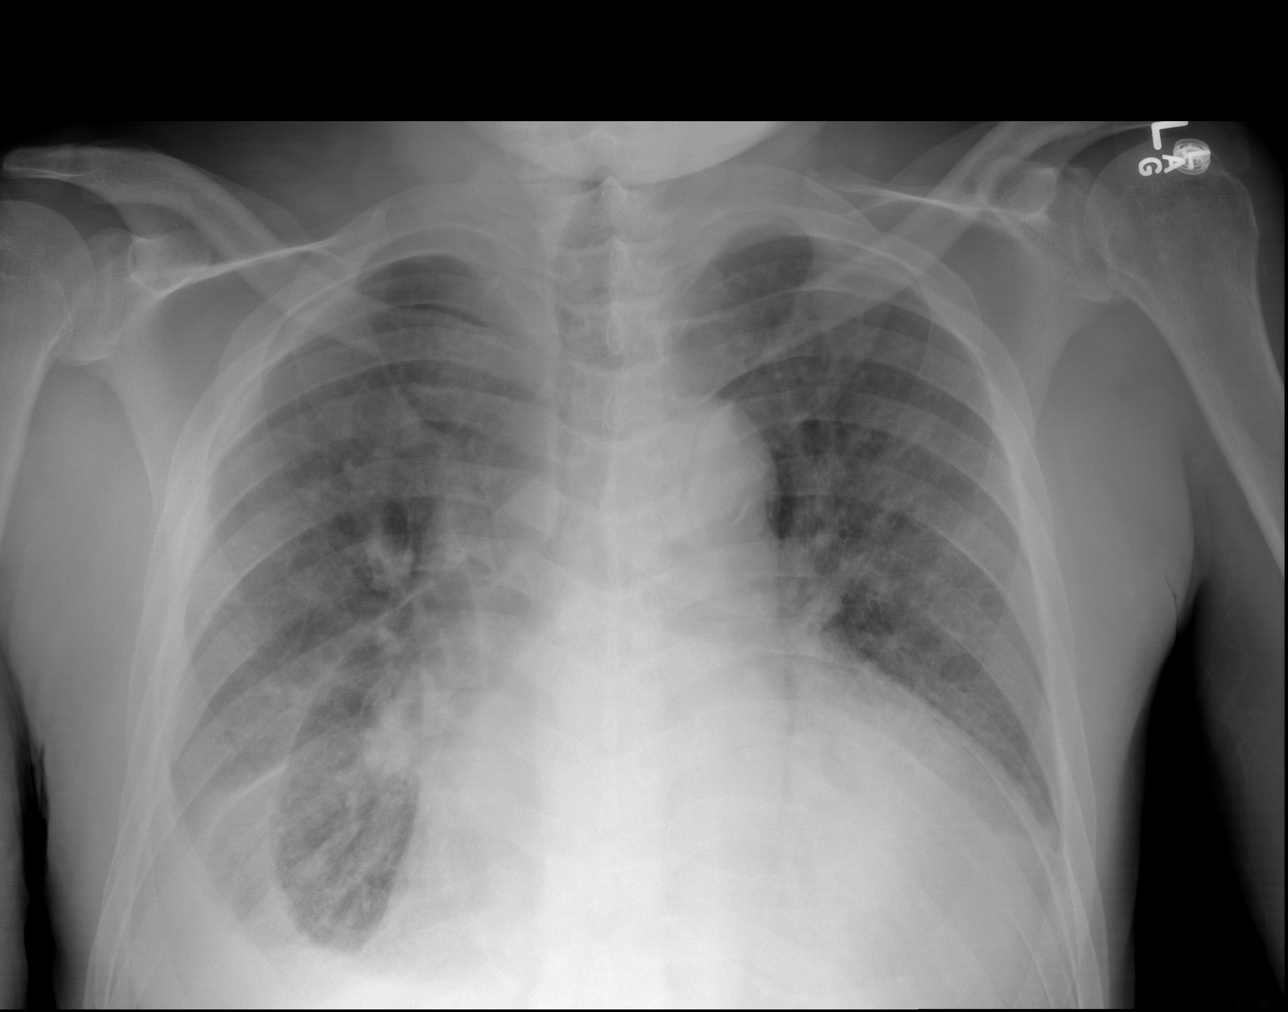

[3 of 3 positions shown; findings below may reference images not displayed]

FINDINGS: Cardiac enlargement. Diffuse pulmonary vascular congestion and
diffuse airspace and interstitial edema bilaterally. Bilateral
pleural effusions, greater on the right. No pneumothorax.
Calcification of the aorta.

Gas and stool within the colon. No small or large bowel distention.
No free intra-abdominal air. No abnormal air-fluid levels. Vascular
calcifications. No radiopaque stones. Degenerative changes in the
spine.
IMPRESSION: Cardiac enlargement with diffuse pulmonary vascular congestion and
edema. Bilateral pleural effusions. Normal nonobstructive bowel gas
pattern.

## 2020-09-13 IMAGING — CR DG FOOT COMPLETE 3+V*R*
3 series · 3 of 3 positions shown · non-contrast
Comparison: None.

CLINICAL DATA: 52 y/o M; injury to right foot with pain along the
dorsal side.

EXAM:
RIGHT FOOT COMPLETE - 3+ VIEW

[foot ap]
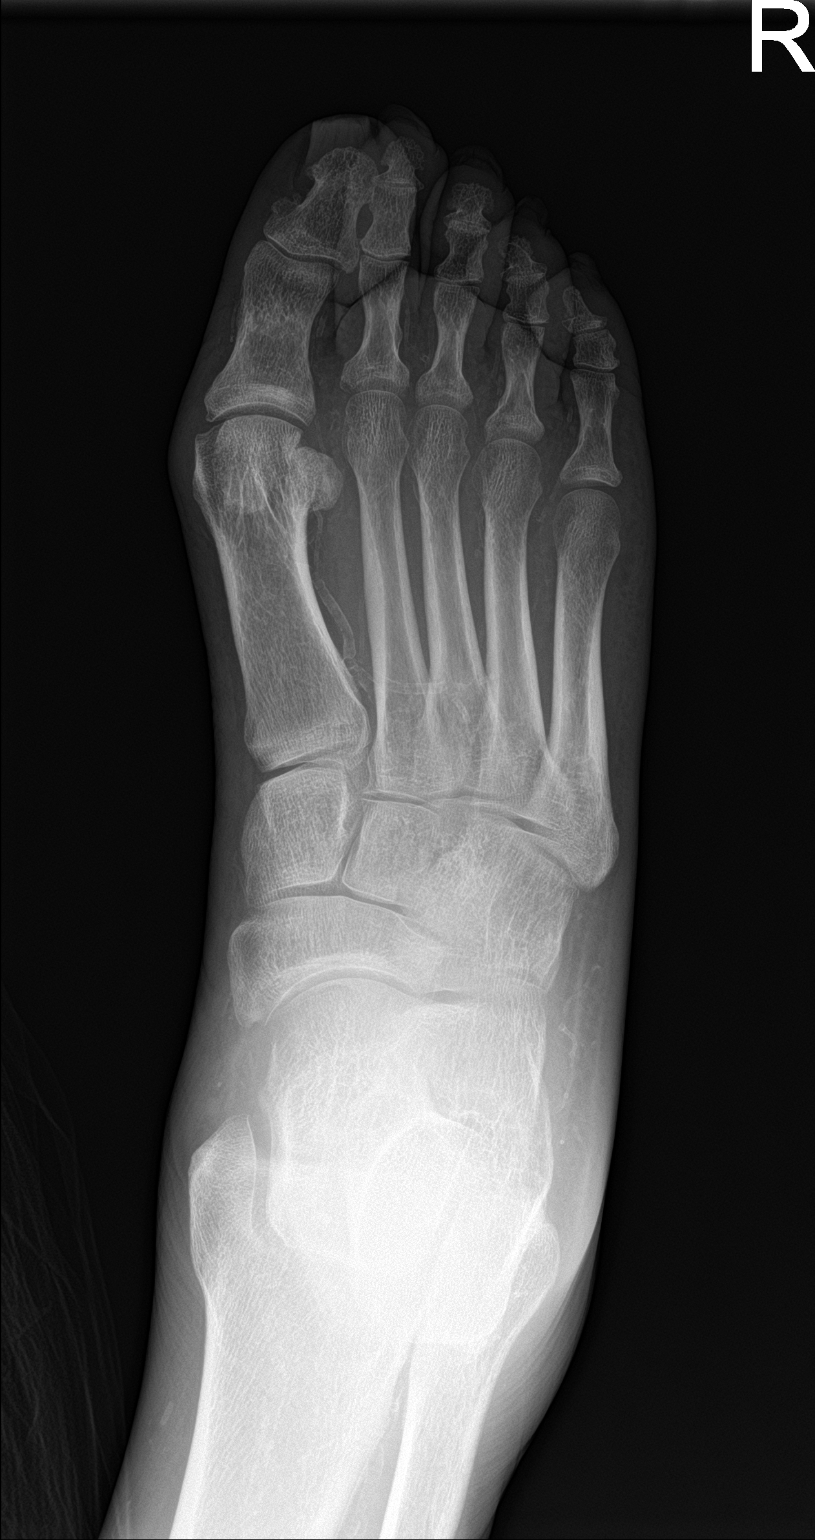

[foot obl]
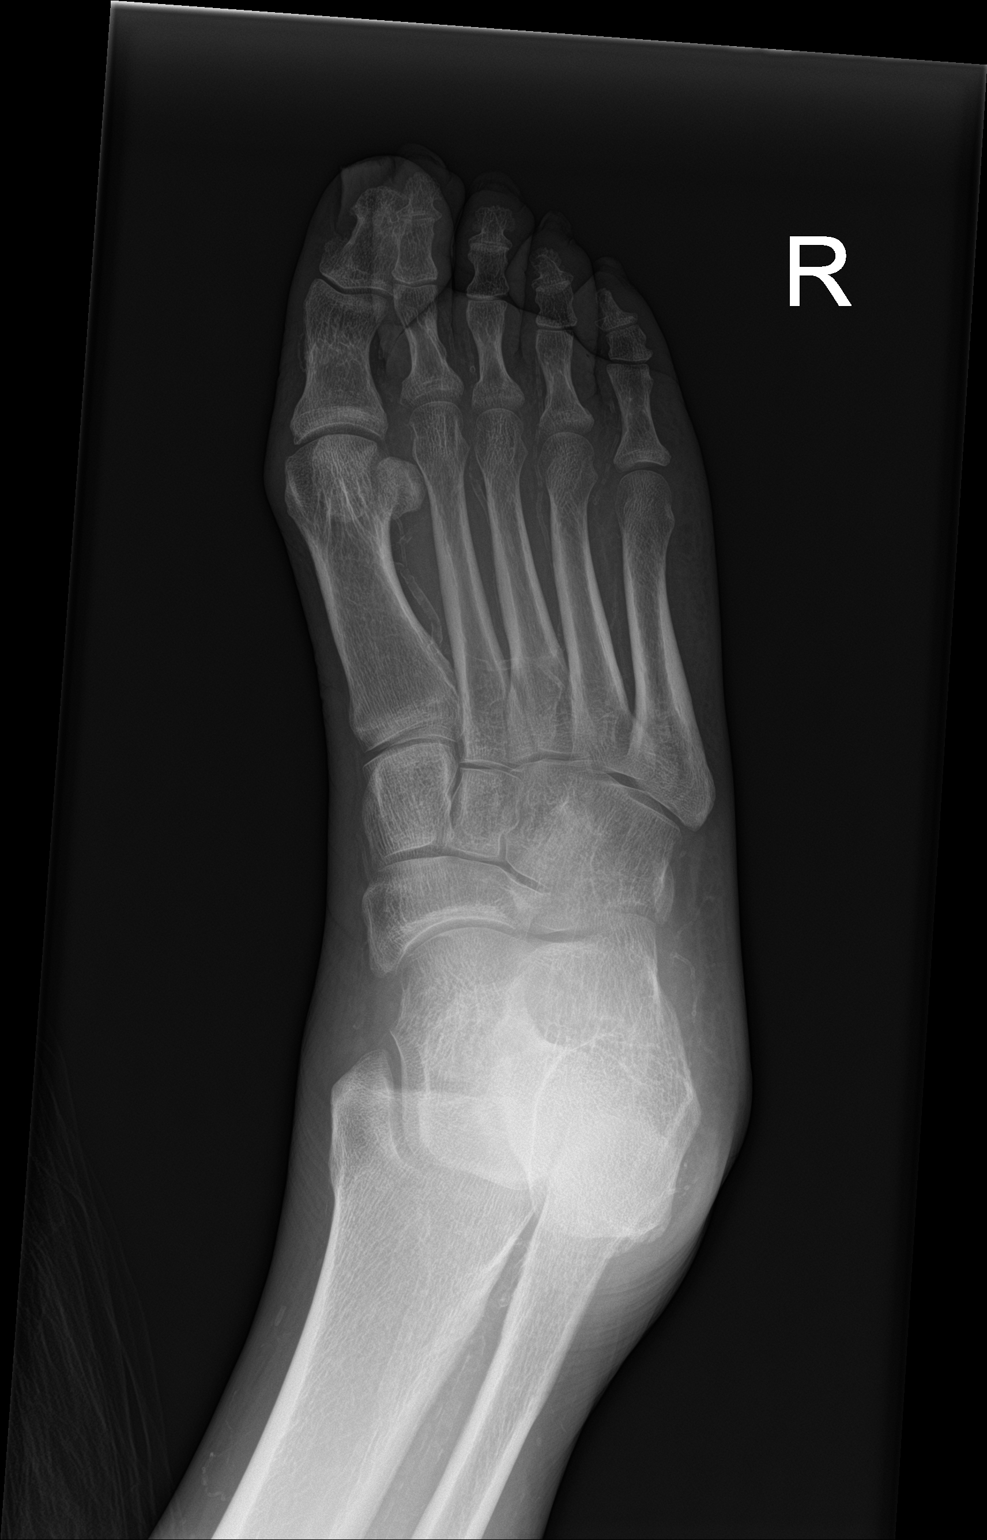

[foot lat]
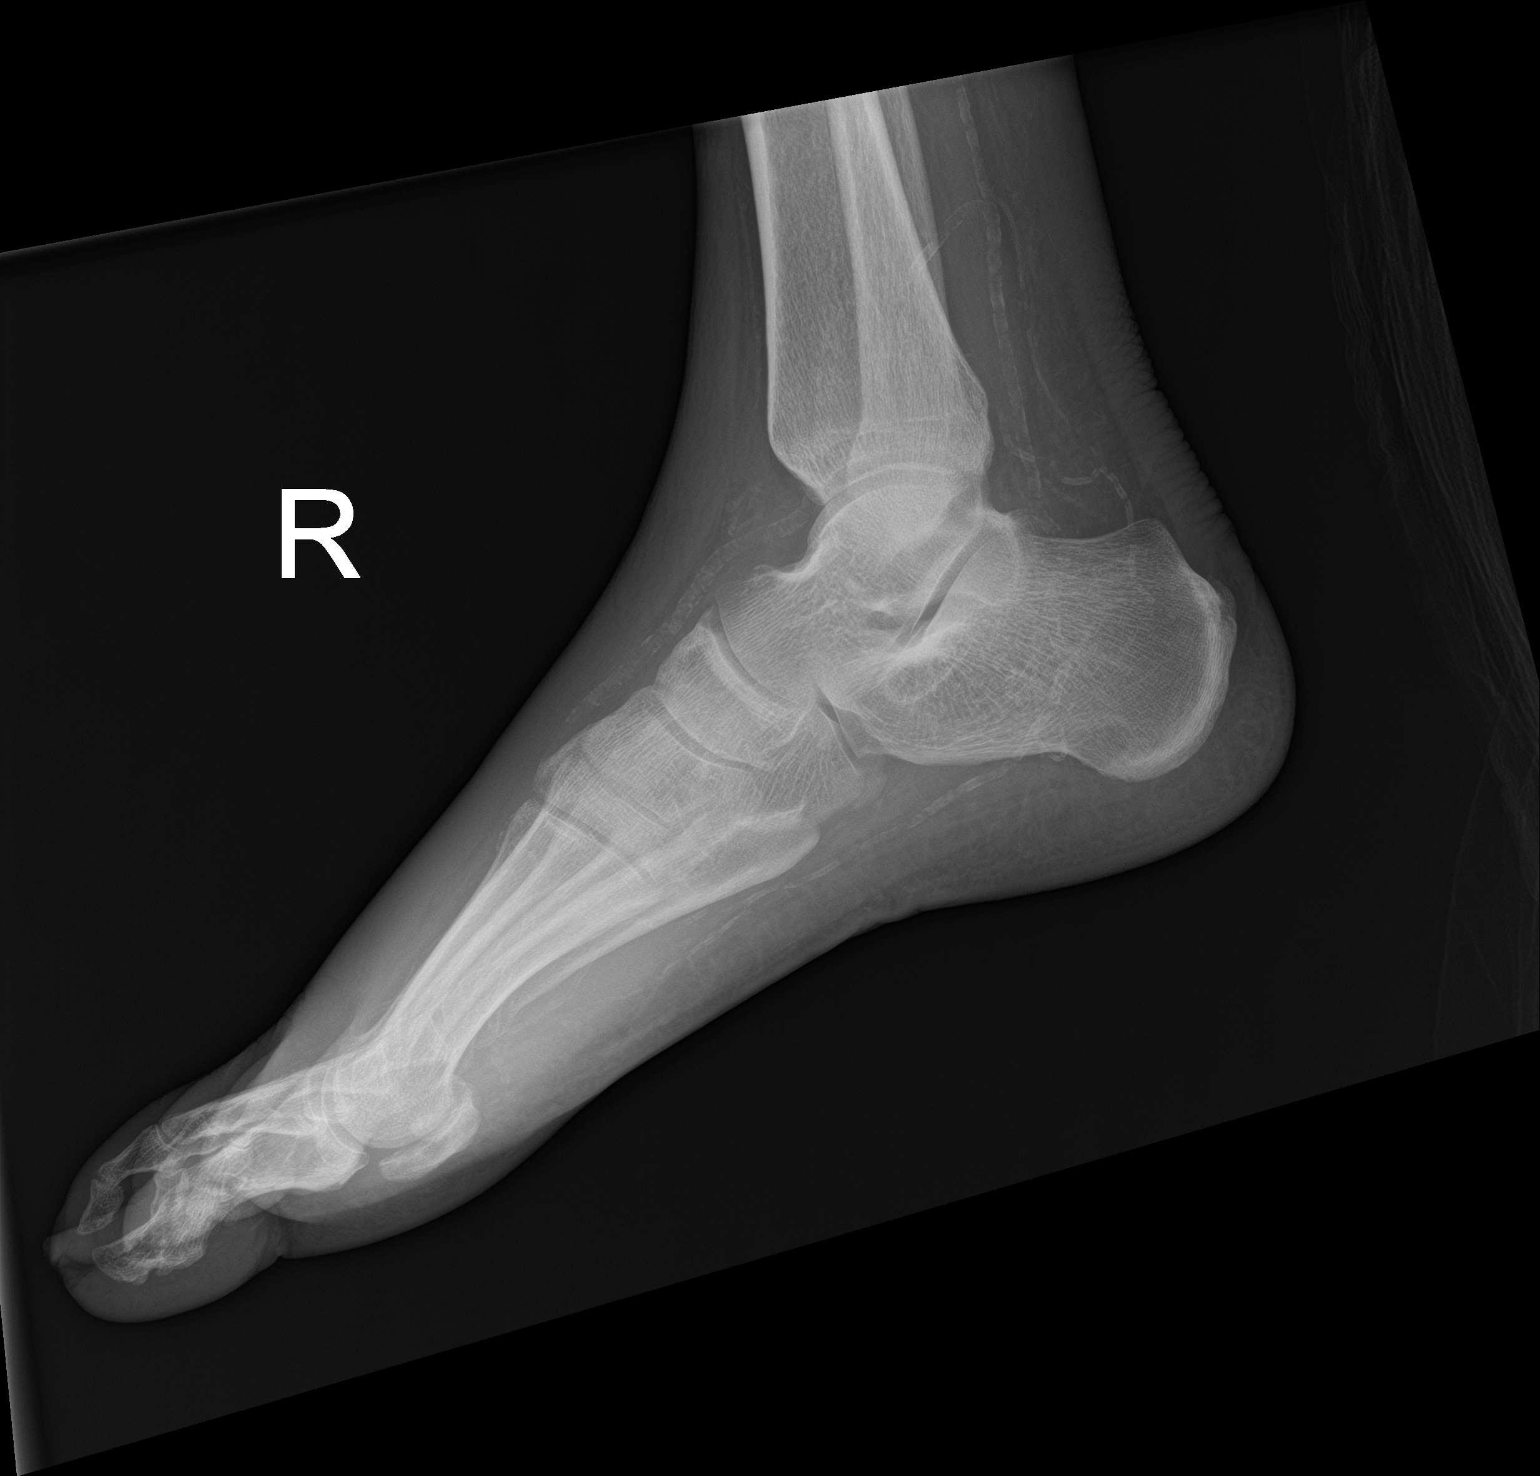

[3 of 3 positions shown; findings below may reference images not displayed]

FINDINGS: There is no evidence of fracture or dislocation. There is no
evidence of arthropathy or other focal bone abnormality. Vascular
calcifications noted.
IMPRESSION: No acute bony or articular abnormality identified.

By: Rtoyota Joshjax M.D.

## 2020-09-13 IMAGING — CR DG CHEST 2V
2 series · 2 of 2 positions shown · non-contrast
Comparison: April 08, 2018

CLINICAL DATA: Back pain.

EXAM:
CHEST - 2 VIEW

[chest lat]
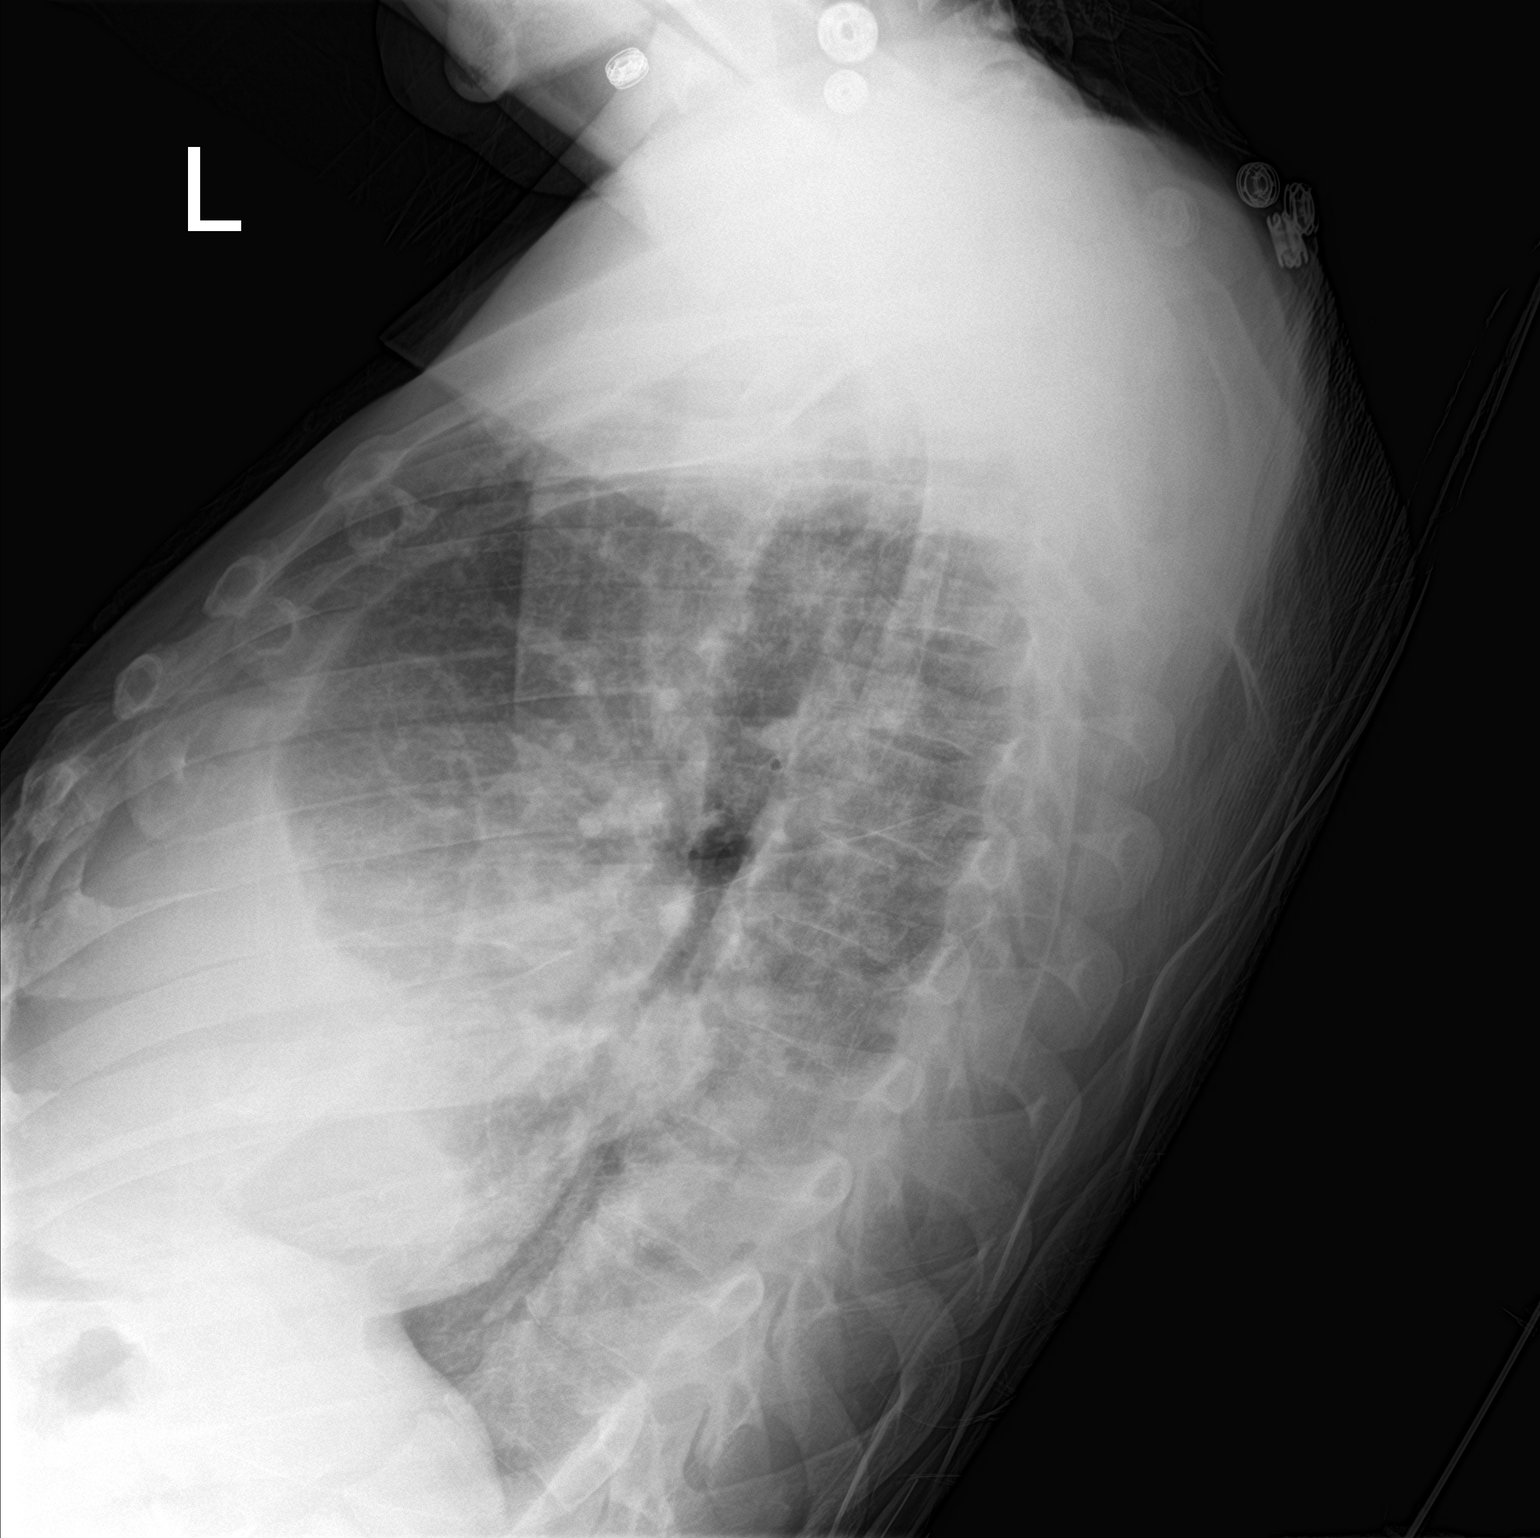

[chest ap]
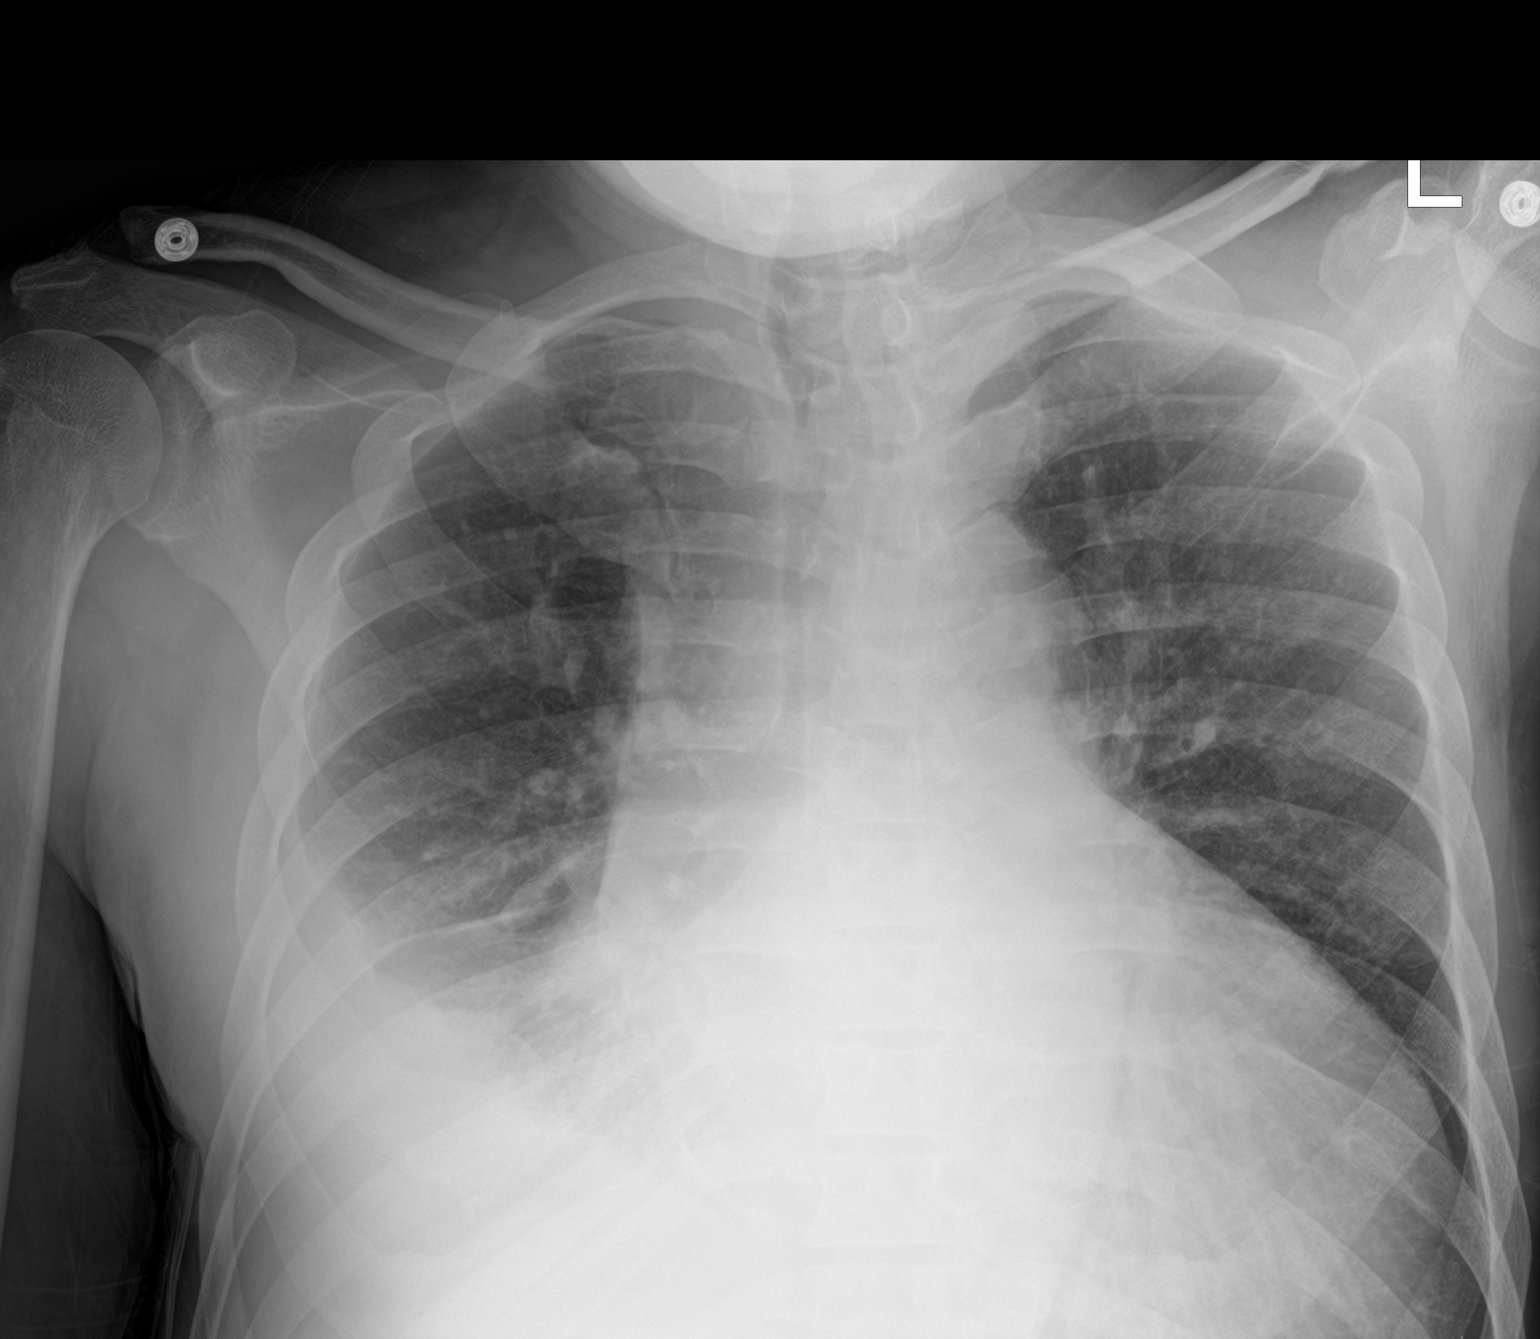

[2 of 2 positions shown; findings below may reference images not displayed]

FINDINGS: There is a moderate right-sided pleural effusion with underlying
opacity. Stable cardiomegaly. Mild pulmonary venous congestion
without overt edema. No other acute abnormalities.
IMPRESSION: Cardiomegaly, right moderate pleural effusion, and pulmonary venous
congestion.

## 2020-09-27 IMAGING — CT CT ABD-PELV W/ CM
2 of 5 series · 16 of 46 positions shown, 18 images · IV contrast (omnipaque)
Comparison: CT scan of February 04, 2018.

CLINICAL DATA: Acute generalized abdominal pain.

EXAM:
CT ABDOMEN AND PELVIS WITH CONTRAST
TECHNIQUE: Multidetector CT imaging of the abdomen and pelvis was performed
using the standard protocol following bolus administration of
intravenous contrast.
CONTRAST:  100mL OMNIPAQUE IOHEXOL 300 MG/ML  SOLN

[Series 3: a/p w/ 5mm · axial · 0.87mm/px · z∈[+972,+1427]mm · 13 of 103 slices shown, 15 images]
[im 6/103  soft-tissue]
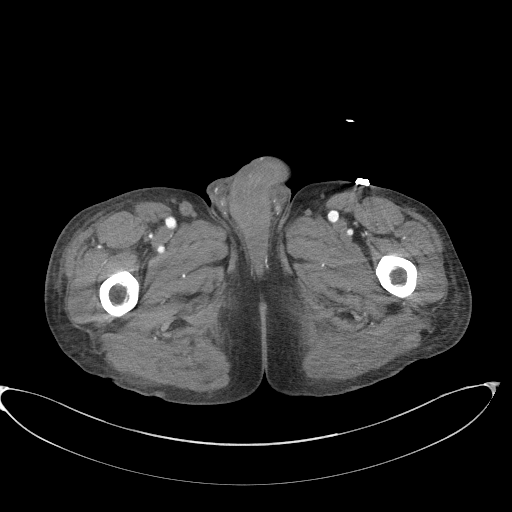
[im 6/103  bone]
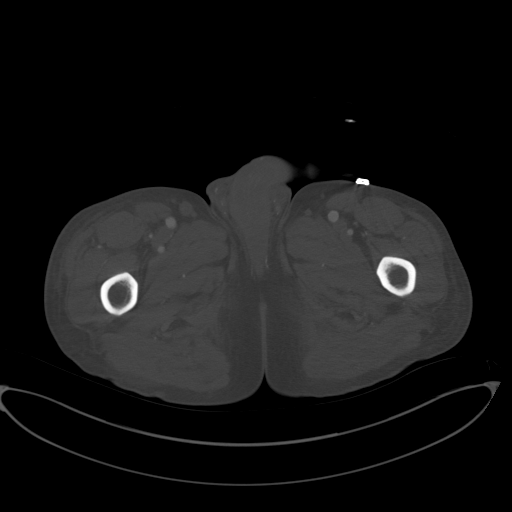
[im 17/103  soft-tissue]
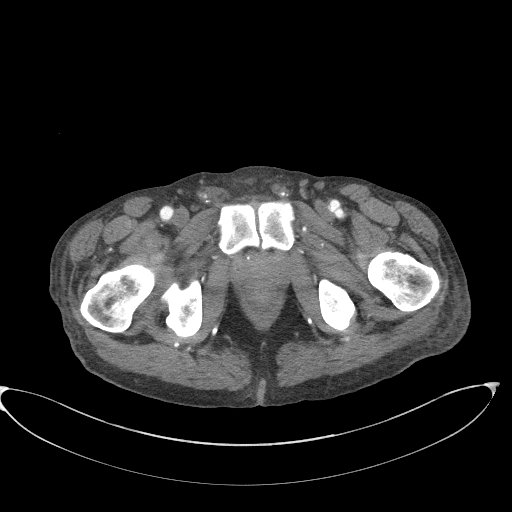
[im 22/103  soft-tissue]
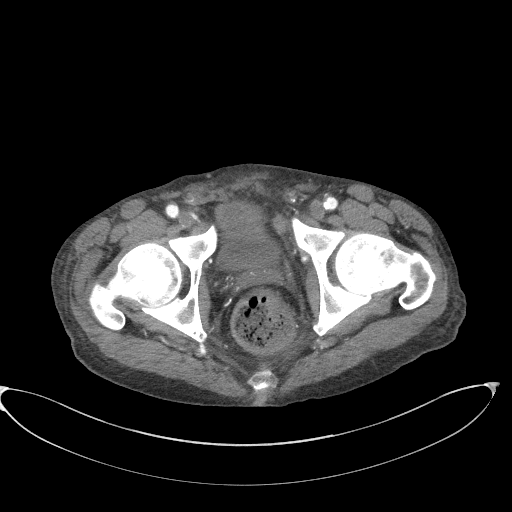
[im 27/103  soft-tissue]
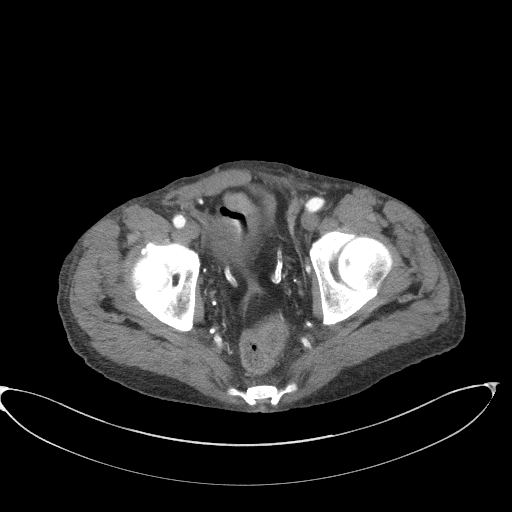
[im 38/103  soft-tissue]
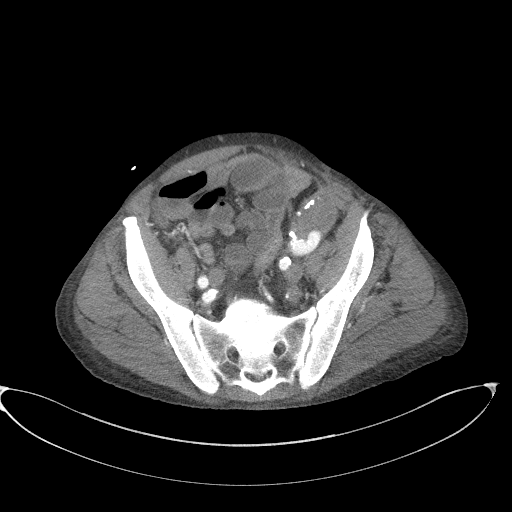
[im 43/103  soft-tissue]
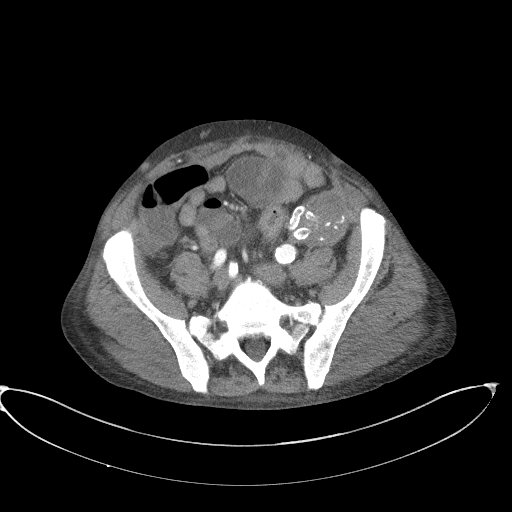
[im 54/103  soft-tissue]
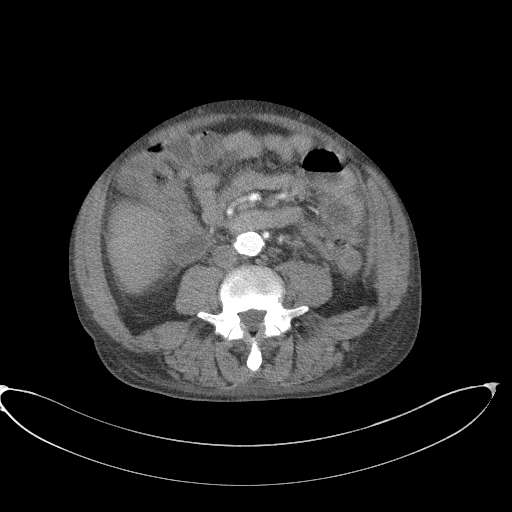
[im 60/103  soft-tissue]
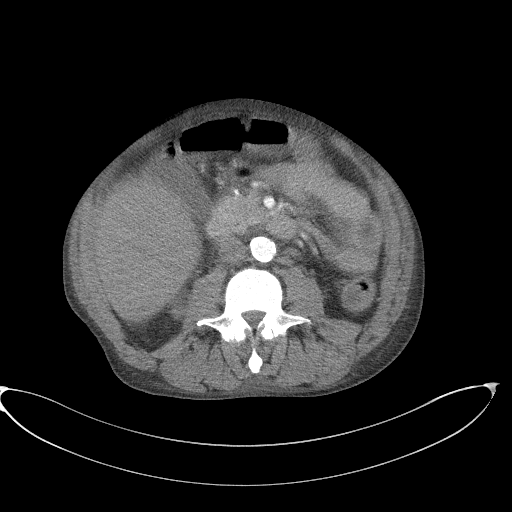
[im 65/103  soft-tissue]
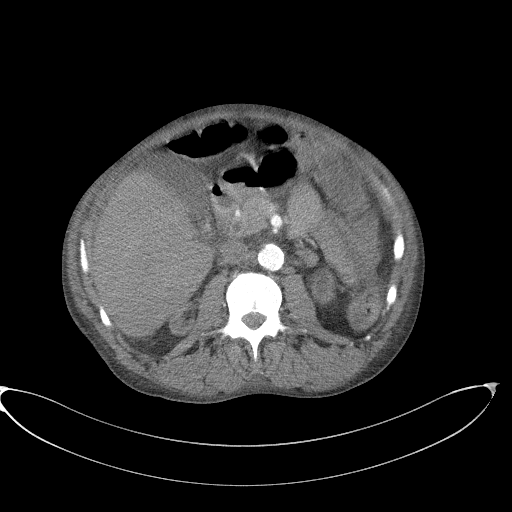
[im 65/103  bone]
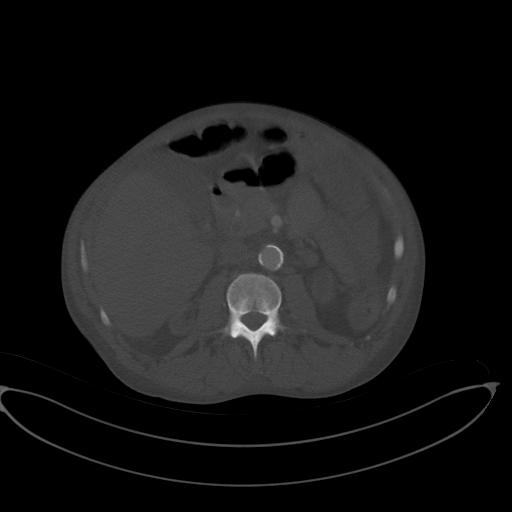
[im 76/103  soft-tissue]
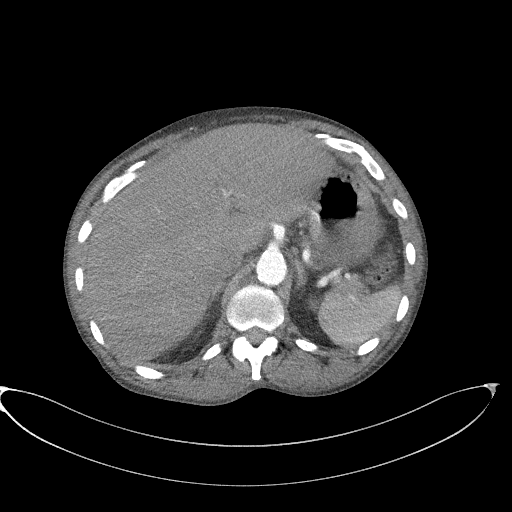
[im 81/103  soft-tissue]
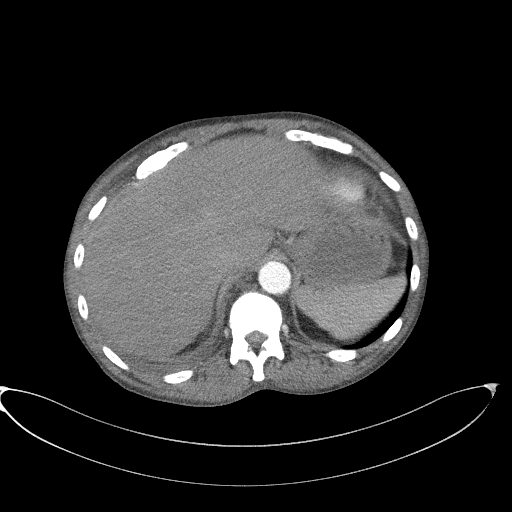
[im 86/103  soft-tissue]
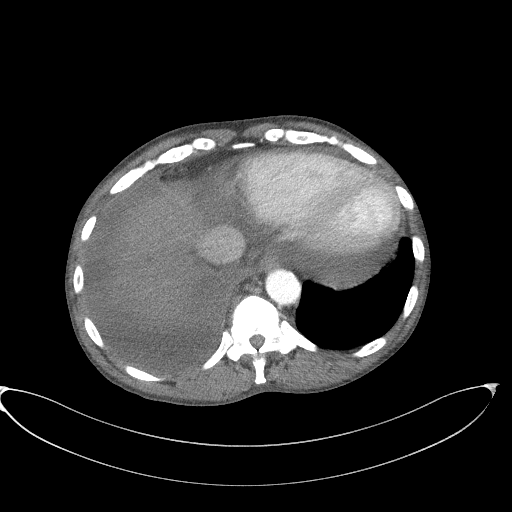
[im 97/103  soft-tissue]
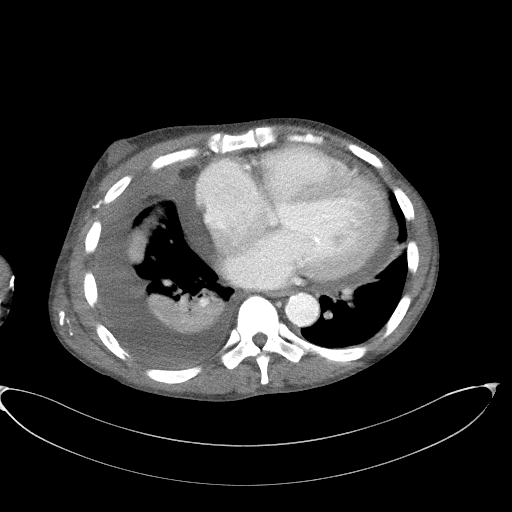

[Series 6: a/p w/ cor · coronal · 0.76mm/px · 3 of 150 slices shown]
[im 50/150  soft-tissue]
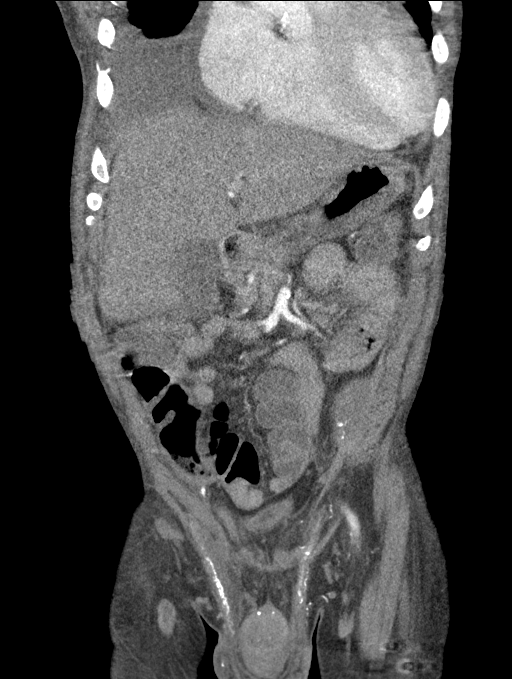
[im 67/150  soft-tissue]
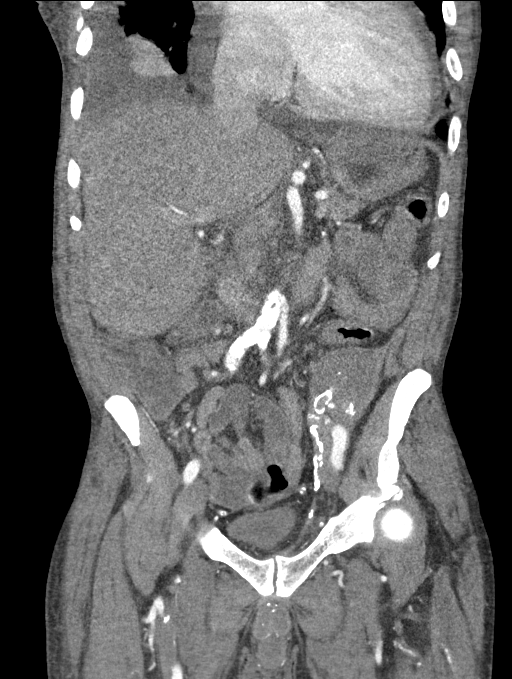
[im 83/150  soft-tissue]
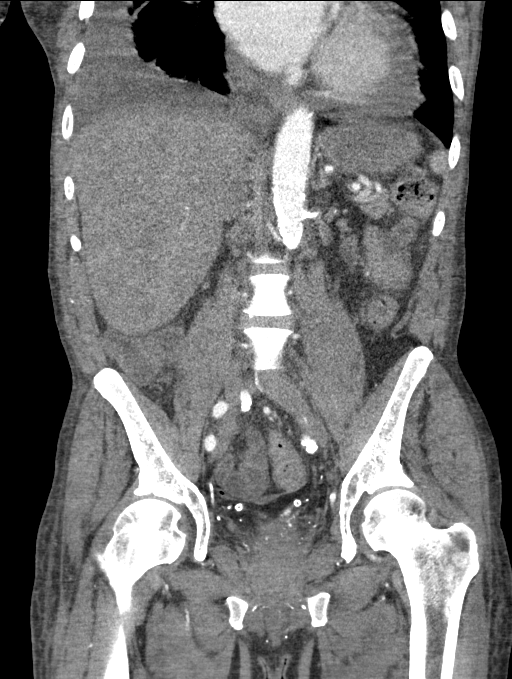

[16 of 46 positions shown; findings below may reference images not displayed]

FINDINGS: Lower chest: Large right pleural effusion is noted with adjacent
atelectasis.

Hepatobiliary: No focal liver abnormality is seen. No gallstones,
gallbladder wall thickening, or biliary dilatation.

Pancreas: Unremarkable. No pancreatic ductal dilatation or
surrounding inflammatory changes.

Spleen: Normal in size without focal abnormality.

Adrenals/Urinary Tract: Adrenal glands appear normal. Bilateral
renal atrophy is noted consistent with history of end-stage renal
disease. Stable 3.1 cm solid mass arising from upper pole of left
kidney. Urinary bladder is decompressed.

Stomach/Bowel: The stomach appears normal. There is no evidence of
bowel obstruction or inflammation. The appendix is not visualized.

Vascular/Lymphatic: Aortic atherosclerosis. No enlarged abdominal or
pelvic lymph nodes.

Reproductive: Prostate is unremarkable.

Other: Failed renal transplant is noted in left lower quadrant. No
hernia or ascites is noted.

Musculoskeletal: Increased diffuse sclerosis is noted consistent
with renal osteodystrophy.
IMPRESSION: Large right pleural effusion with adjacent atelectasis of right
lower lobe.

Bilateral renal atrophy is noted consistent with end-stage renal
disease. Stable 3.1 cm solid mass arising from upper pole of left
kidney. Possible neoplasm cannot be excluded.

Failed renal transplant seen in left lower quadrant.

Aortic Atherosclerosis ([id number]).

## 2020-09-28 IMAGING — DX DG ABDOMEN ACUTE W/ 1V CHEST
3 series · 3 of 3 positions shown · non-contrast
Comparison: Body CT 05/09/2018

CLINICAL DATA: Upper abdominal pain, nausea vomiting.

EXAM:
DG ABDOMEN ACUTE W/ 1V CHEST

[x chest ap]
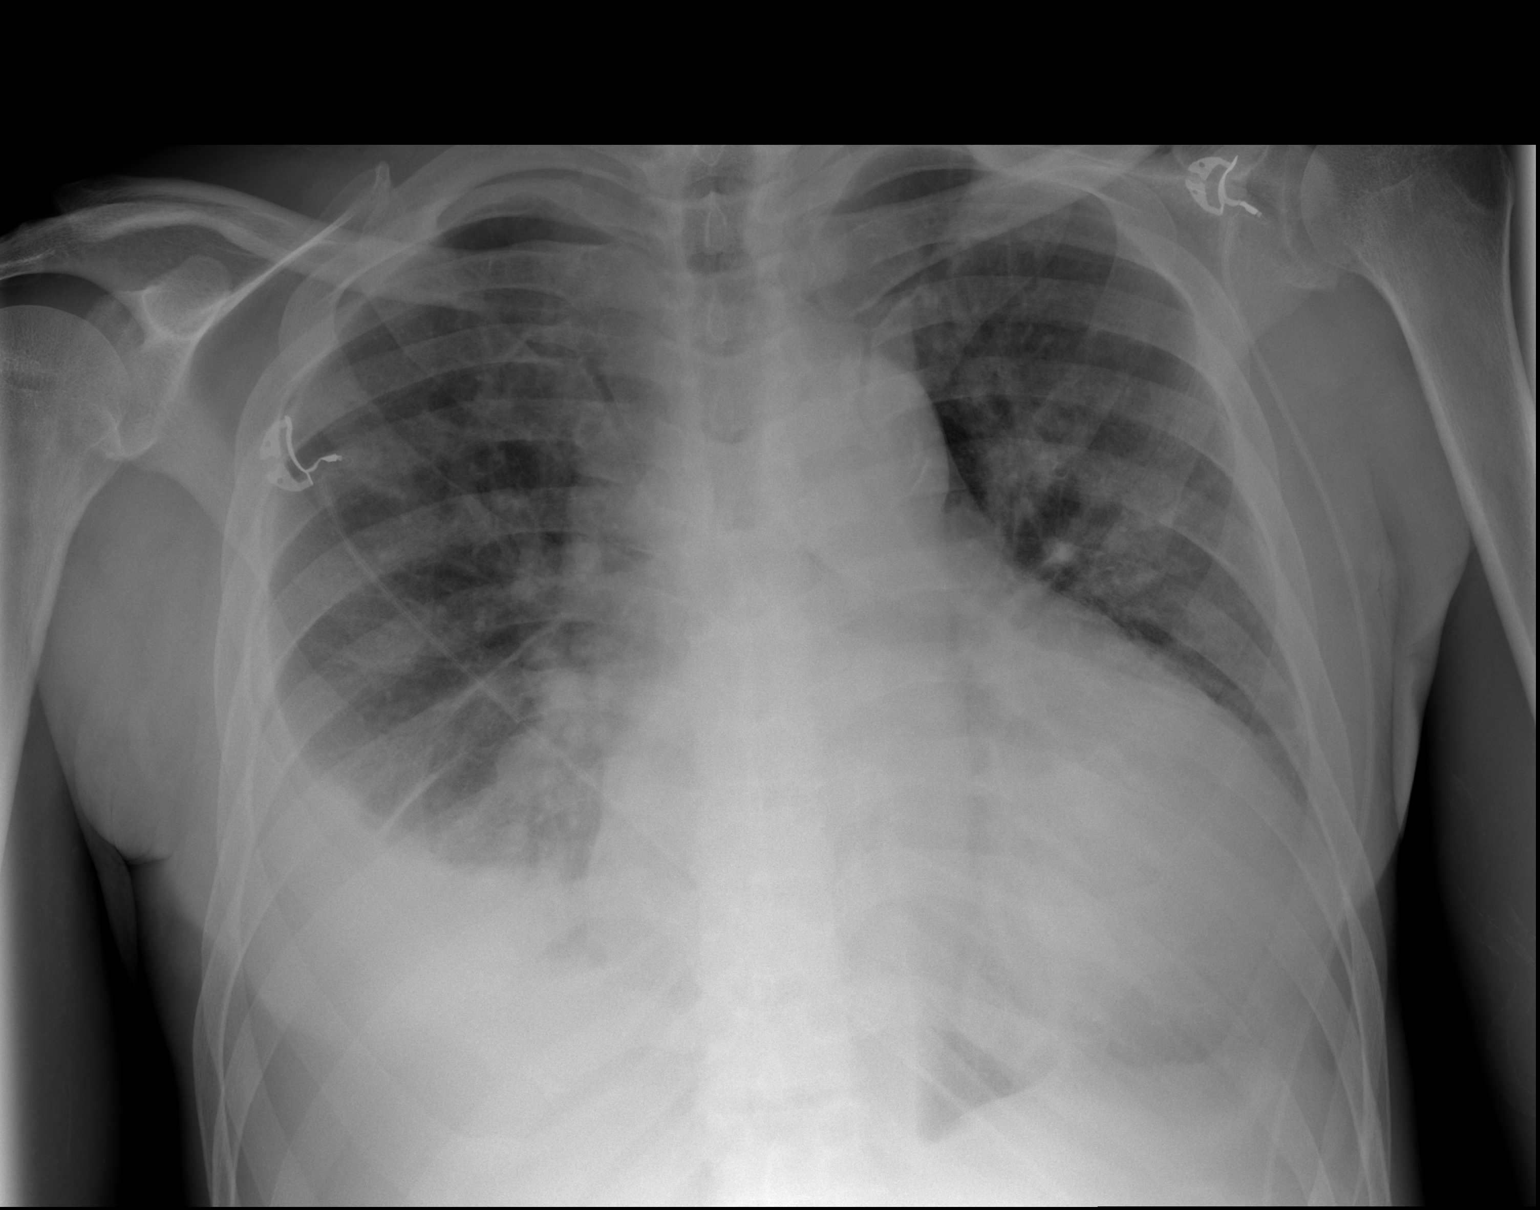

[x abdomen erect]
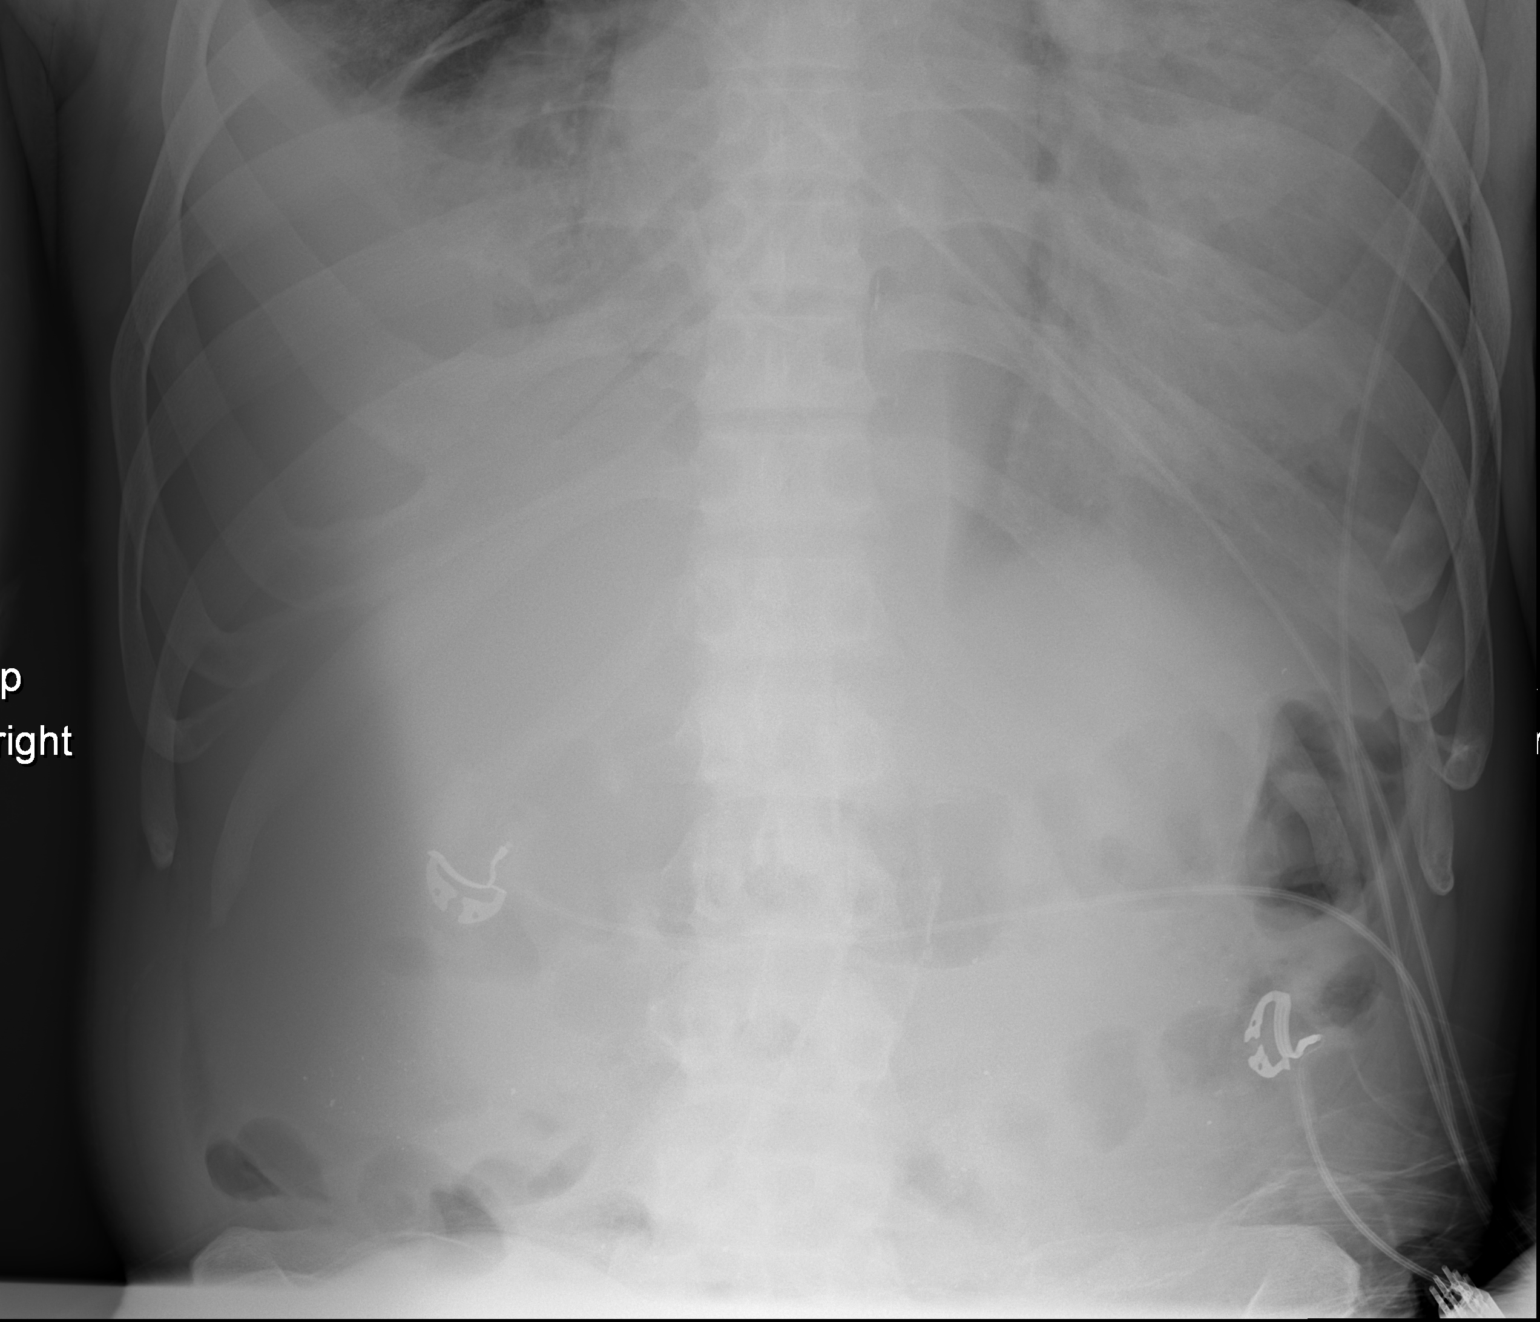

[x abdomen supine]
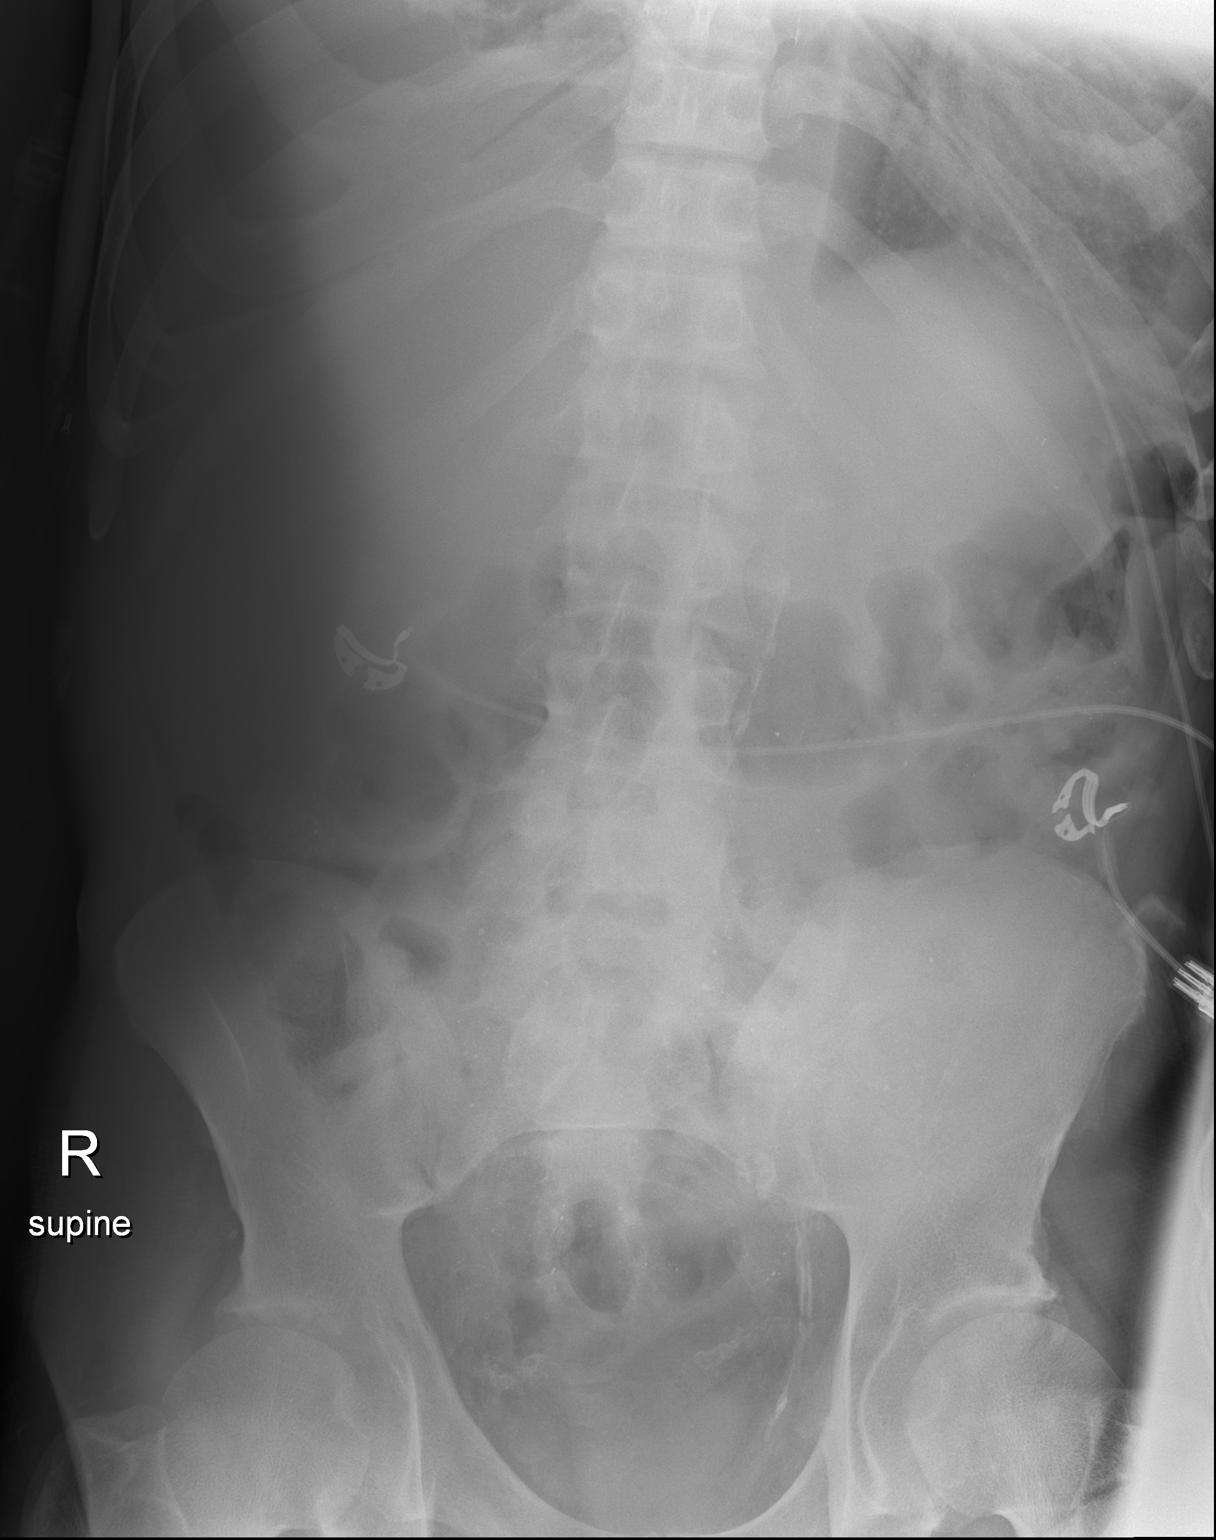

[3 of 3 positions shown; findings below may reference images not displayed]

FINDINGS: Large right pleural effusion with right lower lobe atelectasis
versus airspace consolidation.

Prominence of the vascular markings bilaterally.

Enlarged cardiac silhouette.

Calcific atherosclerotic disease of the aorta.

Nonobstructive nonspecific bowel gas pattern with relative paucity
of small bowel gas and preserved colonic bowel gas pattern.
IMPRESSION: Nonobstructive nonspecific bowel gas pattern.

Large right pleural effusion with pulmonary vascular congestion.

Enlarged cardiac silhouette.

## 2020-10-21 IMAGING — CT CT ABD-PELV W/ CM
2 of 5 series · 15 of 46 positions shown, 17 images · IV contrast (omnipaque)
Comparison: CT of the abdomen and pelvis performed 05/09/2018

CLINICAL DATA: Acute onset of shortness of breath. Patient on
dialysis.

EXAM:
CT ABDOMEN AND PELVIS WITH CONTRAST
TECHNIQUE: Multidetector CT imaging of the abdomen and pelvis was performed
using the standard protocol following bolus administration of
intravenous contrast.
CONTRAST:  100mL OMNIPAQUE IOHEXOL 300 MG/ML  SOLN

[Series 3: a/p w/ 5mm · axial · 0.80mm/px · z∈[+865,+1280]mm · 12 of 93 slices shown, 14 images]
[im 5/93  soft-tissue]
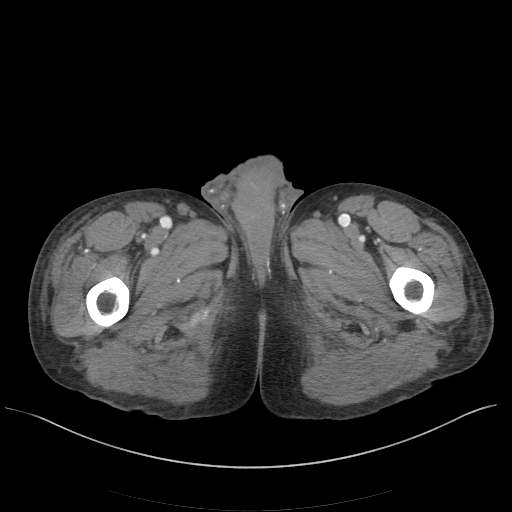
[im 5/93  bone]
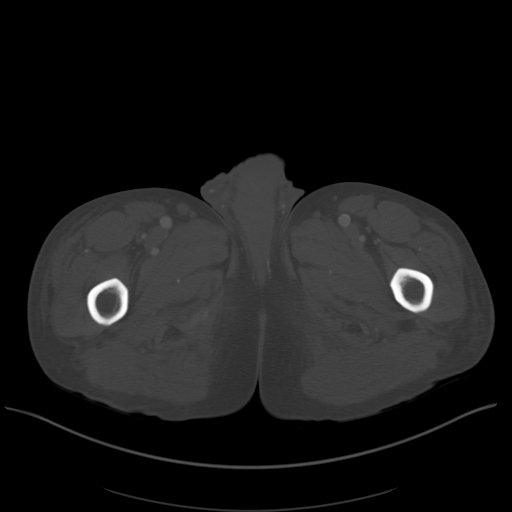
[im 15/93  soft-tissue]
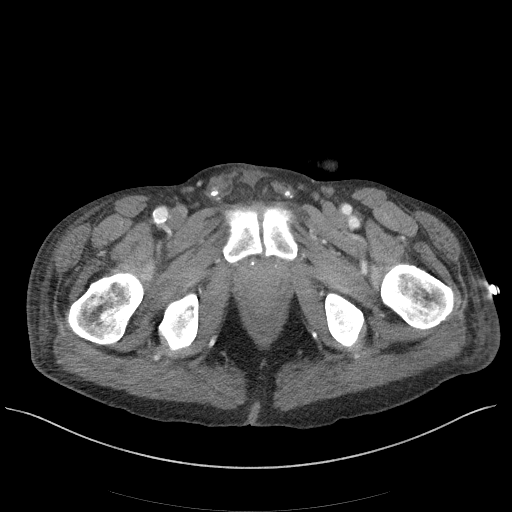
[im 20/93  soft-tissue]
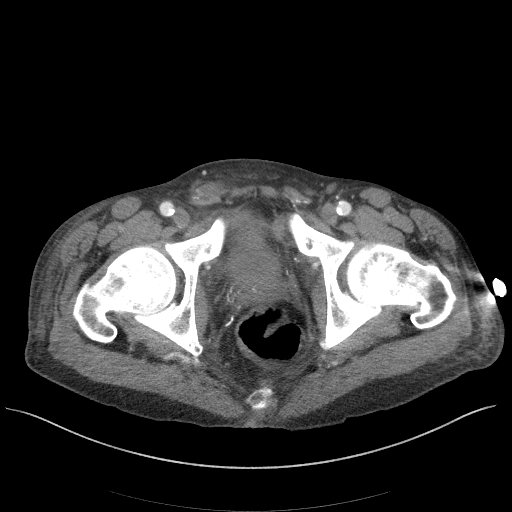
[im 30/93  soft-tissue]
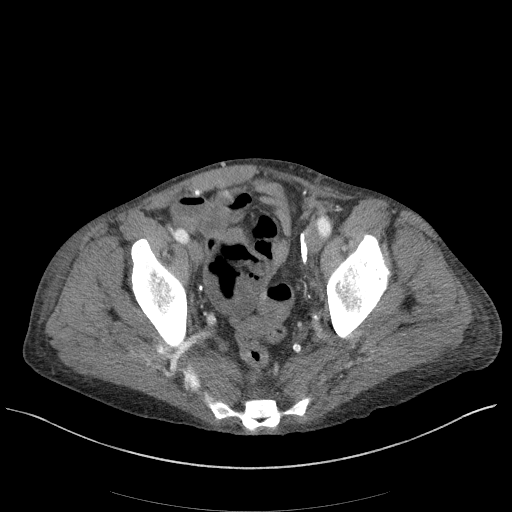
[im 34/93  soft-tissue]
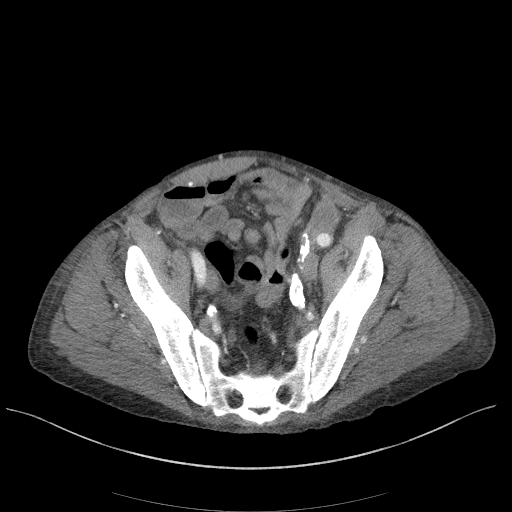
[im 44/93  soft-tissue]
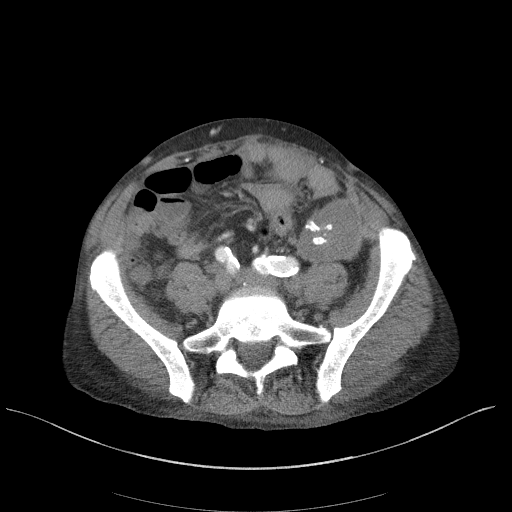
[im 49/93  soft-tissue]
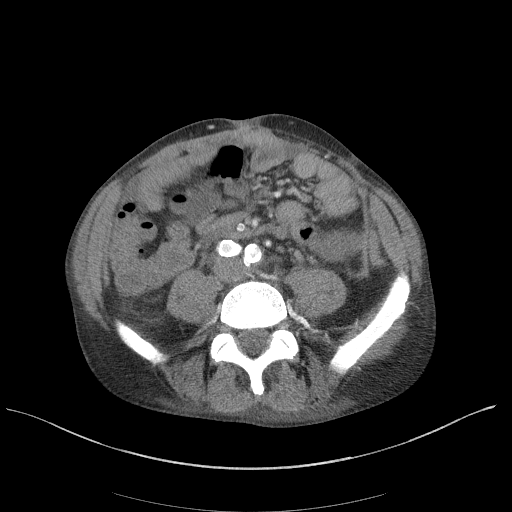
[im 59/93  soft-tissue]
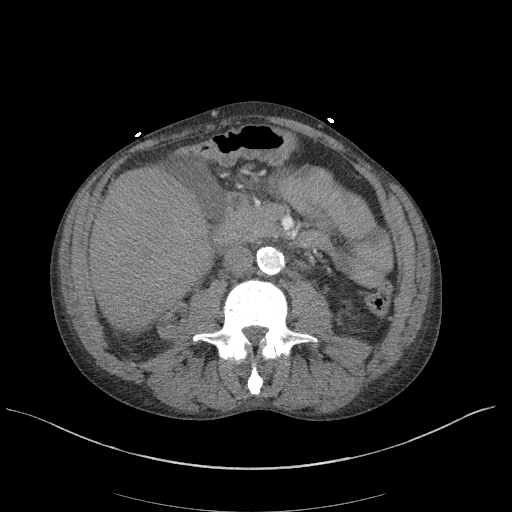
[im 63/93  soft-tissue]
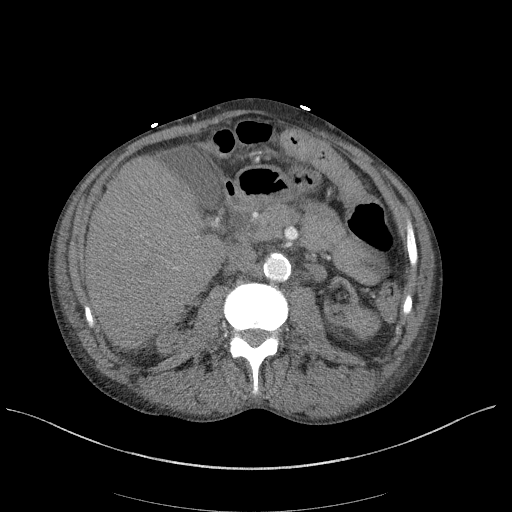
[im 63/93  bone]
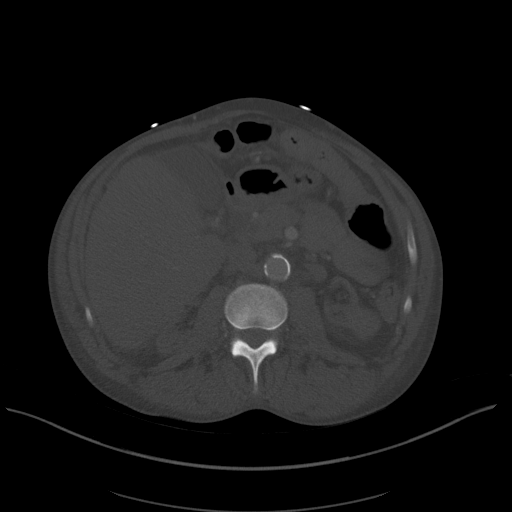
[im 73/93  soft-tissue]
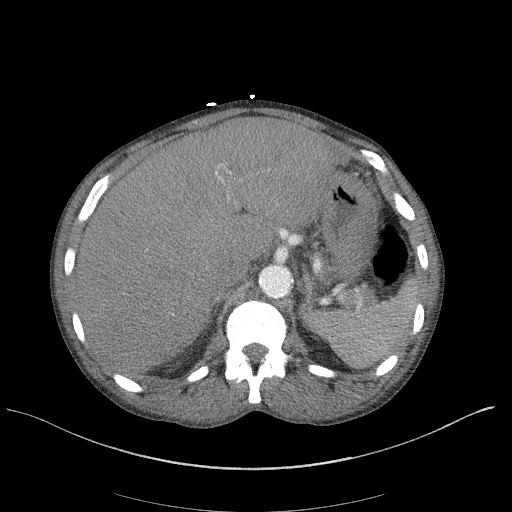
[im 78/93  soft-tissue]
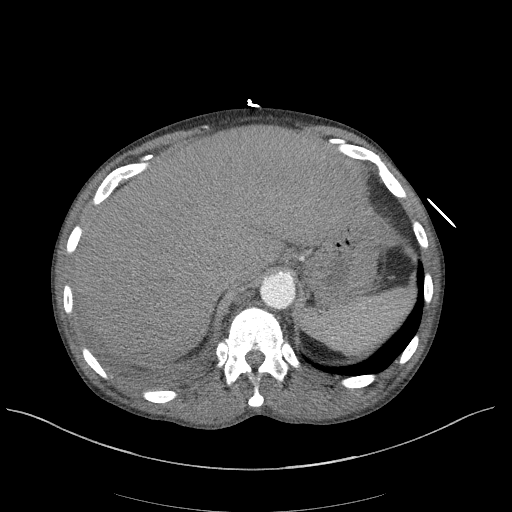
[im 88/93  soft-tissue]
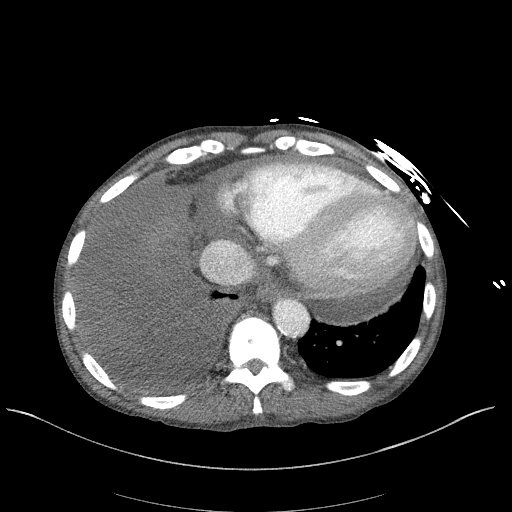

[Series 6: a/p w/ cor · coronal · 0.74mm/px · 3 of 130 slices shown]
[im 44/130  soft-tissue]
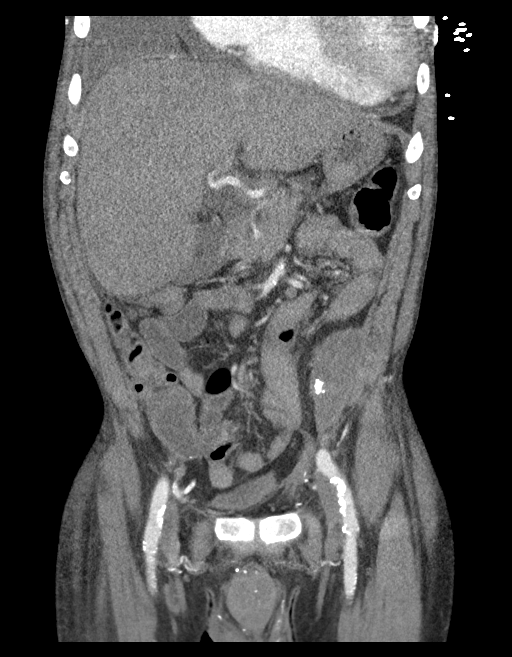
[im 58/130  soft-tissue]
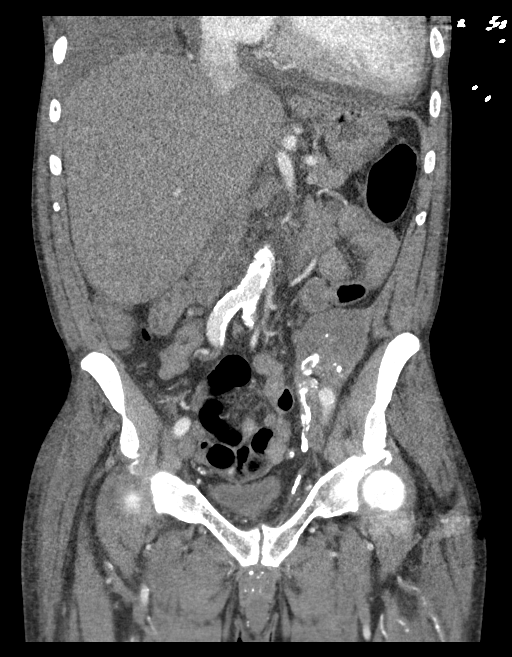
[im 72/130  soft-tissue]
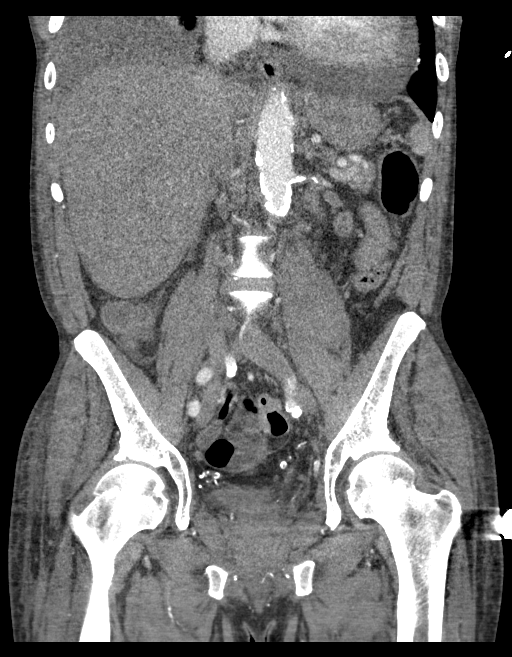

[15 of 46 positions shown; findings below may reference images not displayed]

FINDINGS: Lower chest: A moderate right-sided pleural effusion is noted, with
partial consolidation of the right lower lobe. A small pericardial
effusion is identified.

Hepatobiliary: The liver is unremarkable in appearance. The
gallbladder is unremarkable in appearance. The common bile duct
remains normal in caliber.

Pancreas: The pancreas is within normal limits.

Spleen: The spleen is unremarkable in appearance.

Adrenals/Urinary Tract: The adrenal glands are unremarkable in
appearance.

There is a mildly heterogeneous 3.1 x 3.1 cm mass arising at the
interpole region of the left kidney, raising suspicion for indolent
renal cell carcinoma. It has increased in size from 2.3 cm in 6557,
though it has decreased slightly in size since 5849.

Severe bilateral renal atrophy is again noted. A left renal cyst is
also seen. There is no evidence of hydronephrosis. No renal or
ureteral stones are identified.

Stomach/Bowel: The stomach is unremarkable in appearance. The small
bowel is within normal limits. The appendix is not visualized; there
is no evidence for appendicitis. The colon is unremarkable in
appearance.

Vascular/Lymphatic: Diffuse calcification is seen along the
abdominal aorta and its branches. There is mild-to-moderate luminal
narrowing at the proximal left common iliac artery, with associated
minimal aneurysmal dilatation of the left common iliac artery to
cm in diameter.

The inferior vena cava is grossly unremarkable. No retroperitoneal
lymphadenopathy is seen. No pelvic sidewall lymphadenopathy is
identified.

Reproductive: The soft tissue mass with vascular calcifications at
the left iliac fossa reflects an atrophied transplant kidney. The
bladder is decompressed and not well characterized. The prostate
remains normal in size.

Other: No additional soft tissue abnormalities are seen.

Musculoskeletal: No acute osseous abnormalities are identified. The
visualized musculature is unremarkable in appearance.
IMPRESSION: 1. Moderate right-sided pleural effusion, with partial consolidation
of the right lower lung lobe. This may explain the patient's
shortness of breath.
2. Mildly heterogeneous 3.1 x 3.1 cm mass arising at the interpole
region of the left kidney, raising suspicion for indolent renal cell
carcinoma. It has increased in size from 2.3 cm in 6557, though it
has decreased slightly in size since [DATE]. Severe bilateral native renal atrophy, and atrophy of the
transplant kidney at the left iliac fossa. Left renal cyst.
4. Mild to moderate luminal narrowing at the proximal left common
iliac artery, with associated minimal aneurysmal dilatation of the
left common iliac artery to 2.2 cm in diameter.
5. Small pericardial effusion noted.

Aortic Atherosclerosis ([id number]-549.9).

## 2020-10-21 IMAGING — DX DG CHEST 1V PORT
1 series · 2 of 2 positions shown · non-contrast
Comparison: 05/10/2018

CLINICAL DATA: Shortness of breath

EXAM:
PORTABLE CHEST 1 VIEW

[Series 1: chest · 0.14mm/px · 2 of 2 slices shown]
[im 1/2]
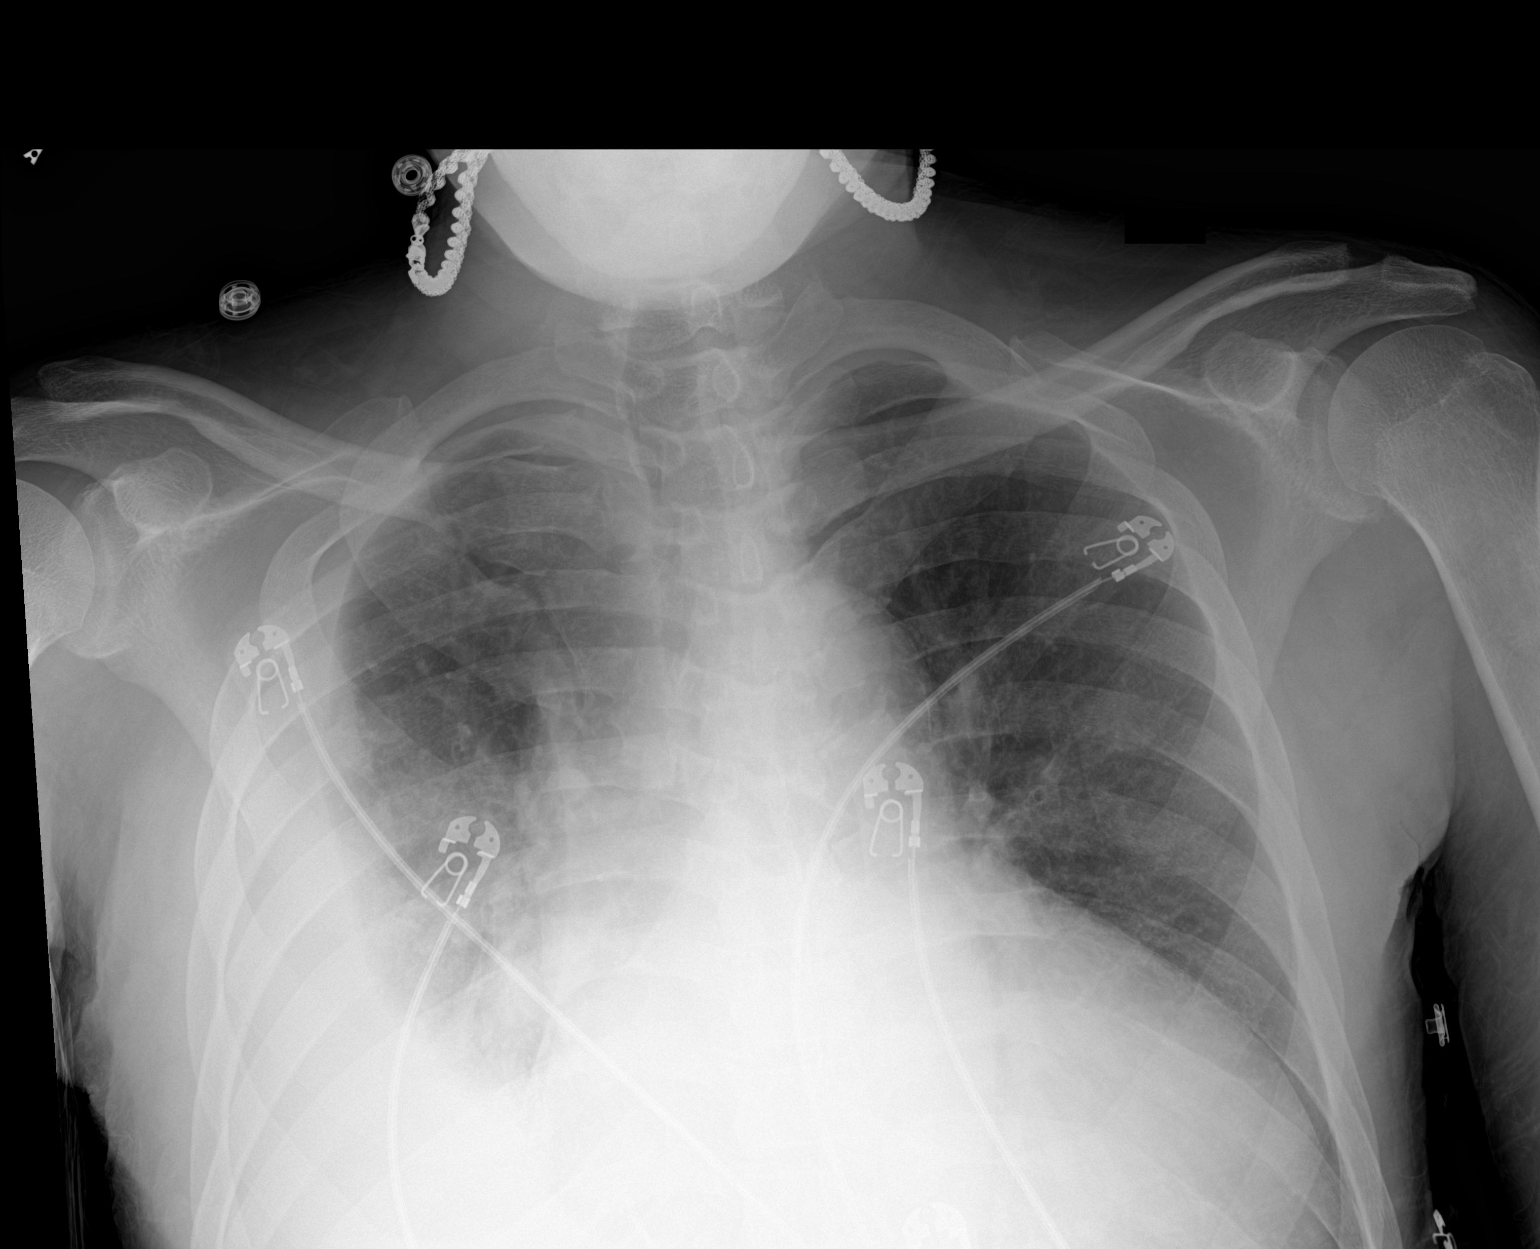
[im 2/2]
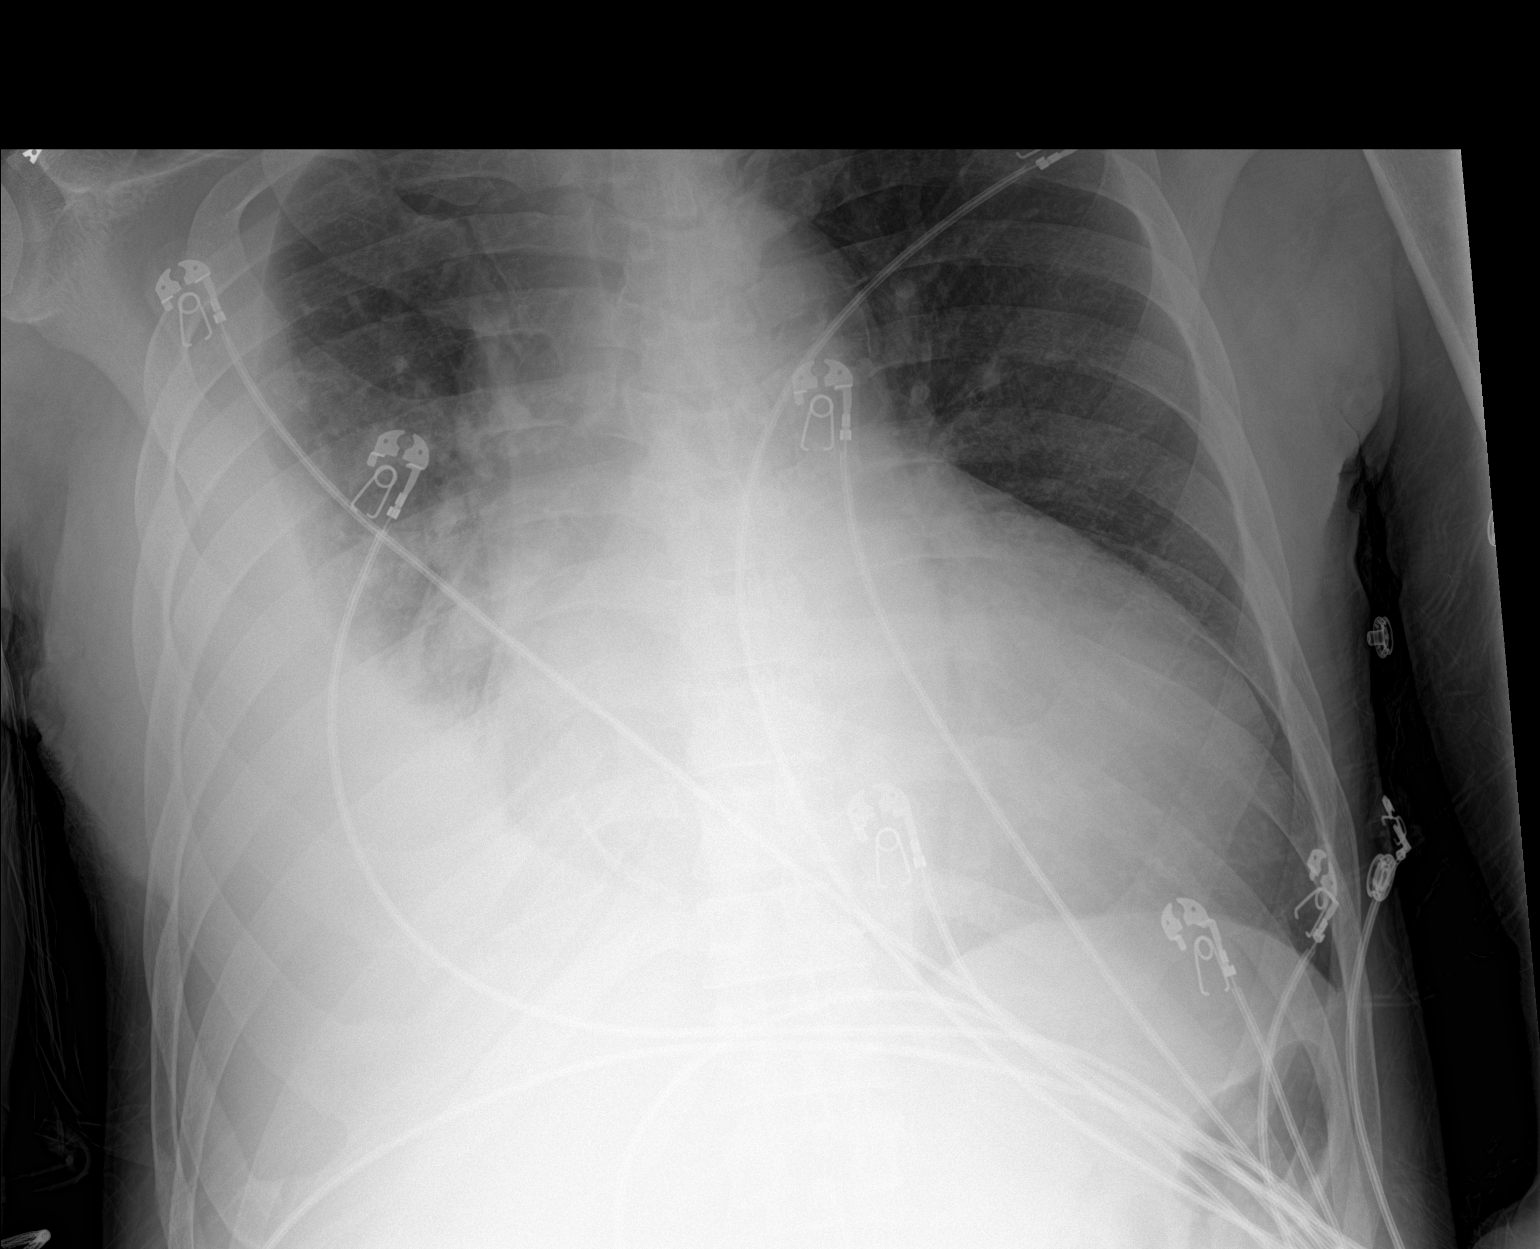

[2 of 2 positions shown; findings below may reference images not displayed]

FINDINGS: Unchanged moderate cardiomegaly. Right pleural effusion is increased
in size. There is associated basilar atelectasis. No pneumothorax.
IMPRESSION: Increased size of right pleural effusion.

## 2020-11-01 IMAGING — DX DG ABDOMEN ACUTE W/ 1V CHEST
3 series · 3 of 3 positions shown · non-contrast
Comparison: 06/02/2018 abdomen/pelvis CT, chest radiograph and
prior studies

CLINICAL DATA: Acute shortness of breath and RIGHT abdominal pain
for 4 days.

EXAM:
DG ABDOMEN ACUTE W/ 1V CHEST

[abdomen erect]
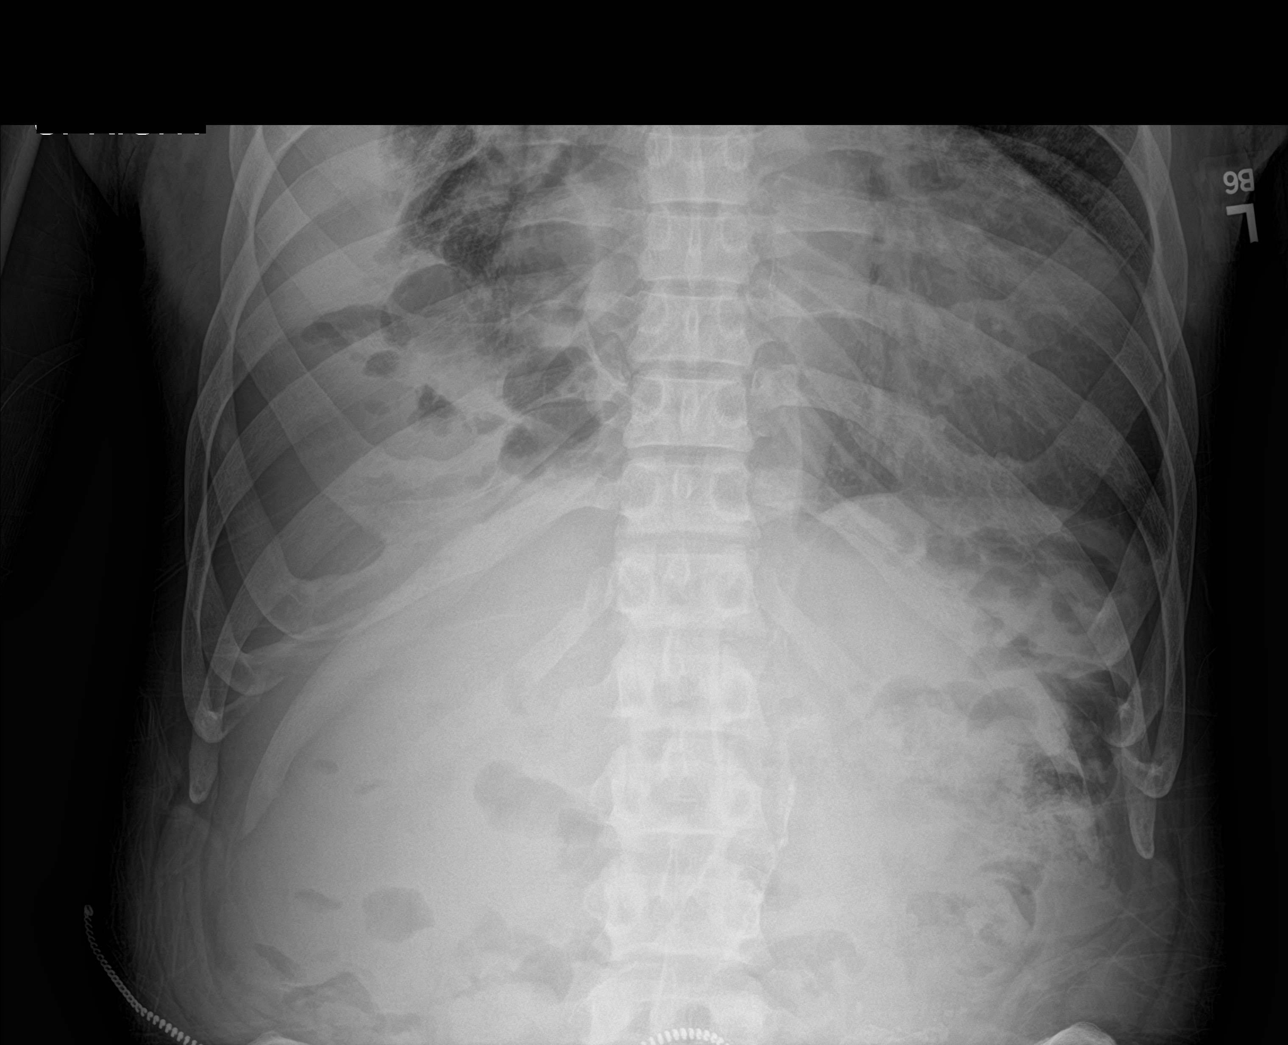

[abdomen decu]
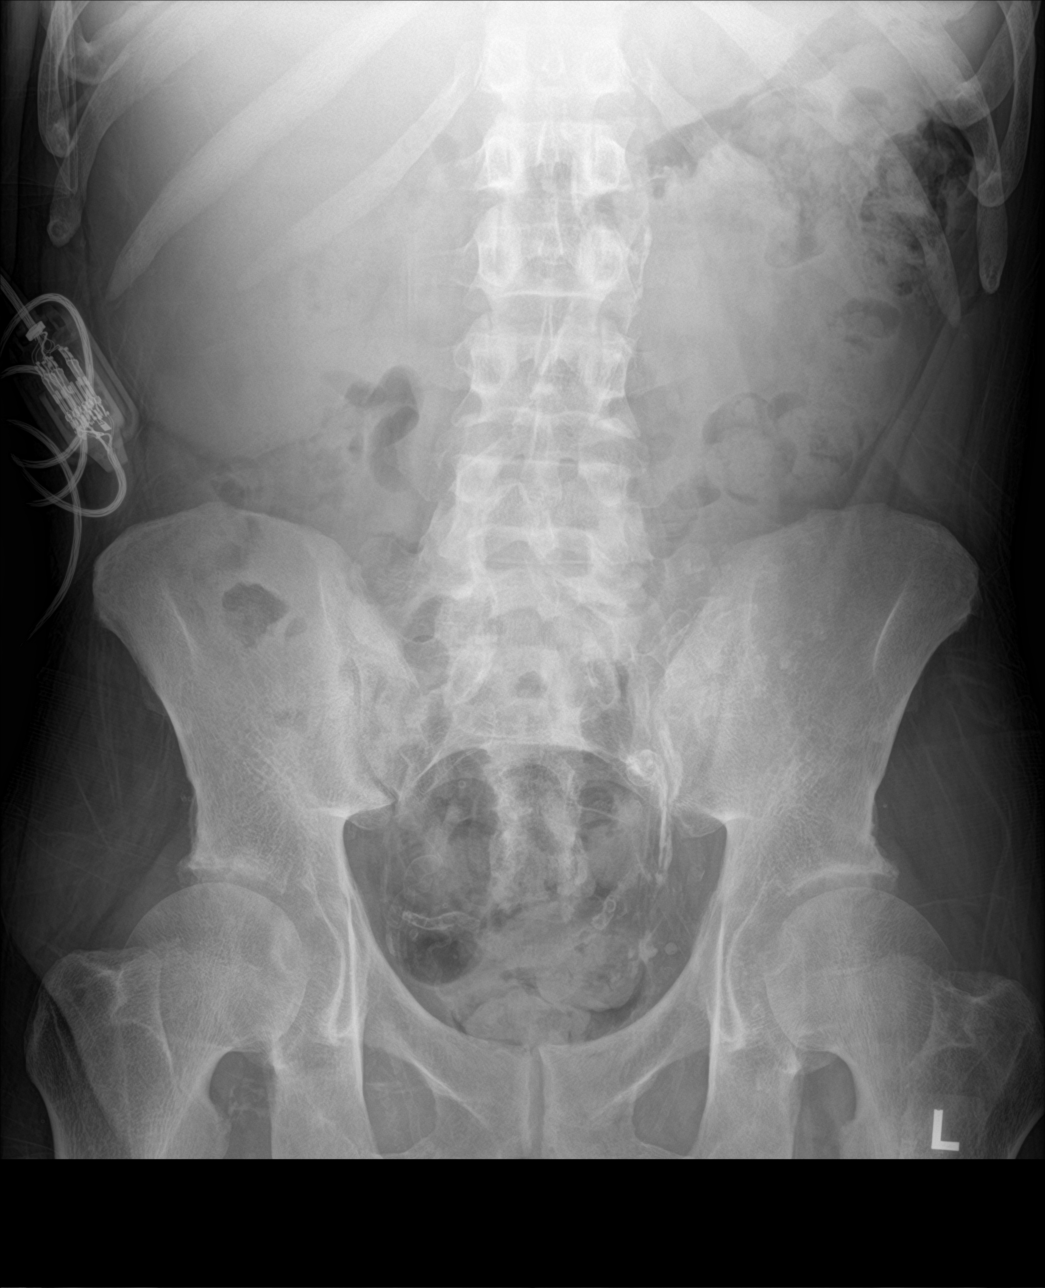

[chest ap]
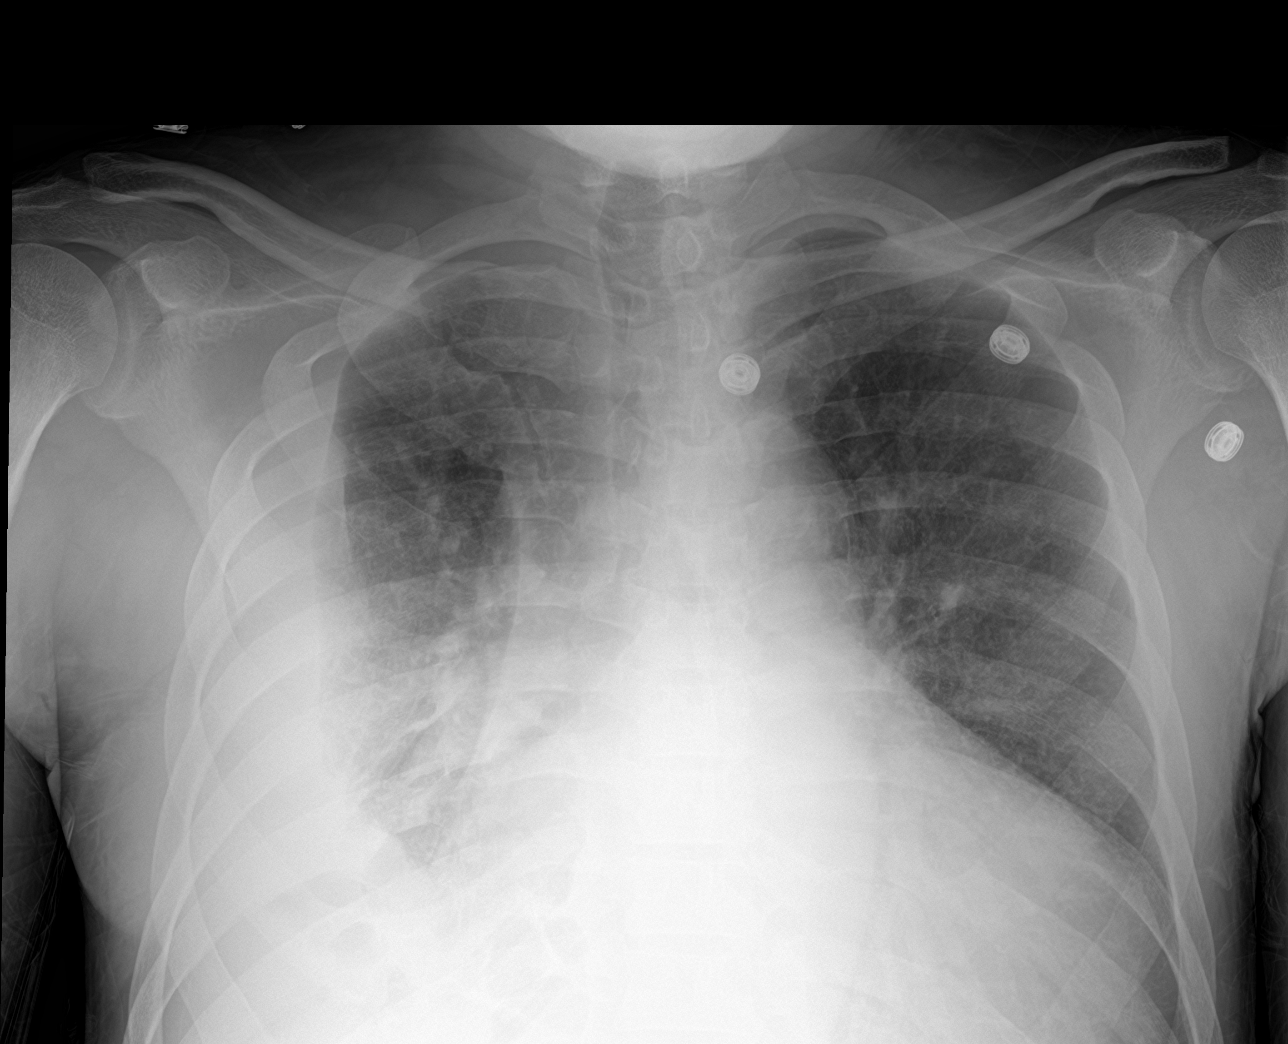

[3 of 3 positions shown; findings below may reference images not displayed]

FINDINGS: Cardiomegaly, pulmonary vascular congestion and large RIGHT pleural
effusion again noted.

There is no evidence of pneumothorax.

Lucencies overlying the RIGHT LOWER lung/hemithorax noted and could
represent partial reexpansion of the RIGHT LOWER lung but appearance
is slightly unusual and cavitary changes or pleural air is not
excluded.

The bowel gas pattern is unremarkable.

No dilated bowel loops or pneumoperitoneum.

No suspicious calcifications are identified.

No acute bony abnormalities are present.
IMPRESSION: 1. Large RIGHT pleural effusion again noted with lucencies overlying
the RIGHT LOWER lung/hemithorax. These lucencies could represent
partial re-expansion of the LEFT LOWER lung, pulmonary cavitary
changes or possibly pulmonary air. Correlate with any recent
thoracentesis/pleural intervention. A LATERAL chest radiograph may
be helpful for localization as initial evaluation, as clinically
indicated..
2. Unremarkable bowel gas pattern.

## 2020-11-03 IMAGING — CR DG CHEST 2V
2 series · 2 of 2 positions shown · non-contrast
Comparison: 06/13/2018

CLINICAL DATA: Shortness of breath.

EXAM:
CHEST - 2 VIEW

[chest lat]
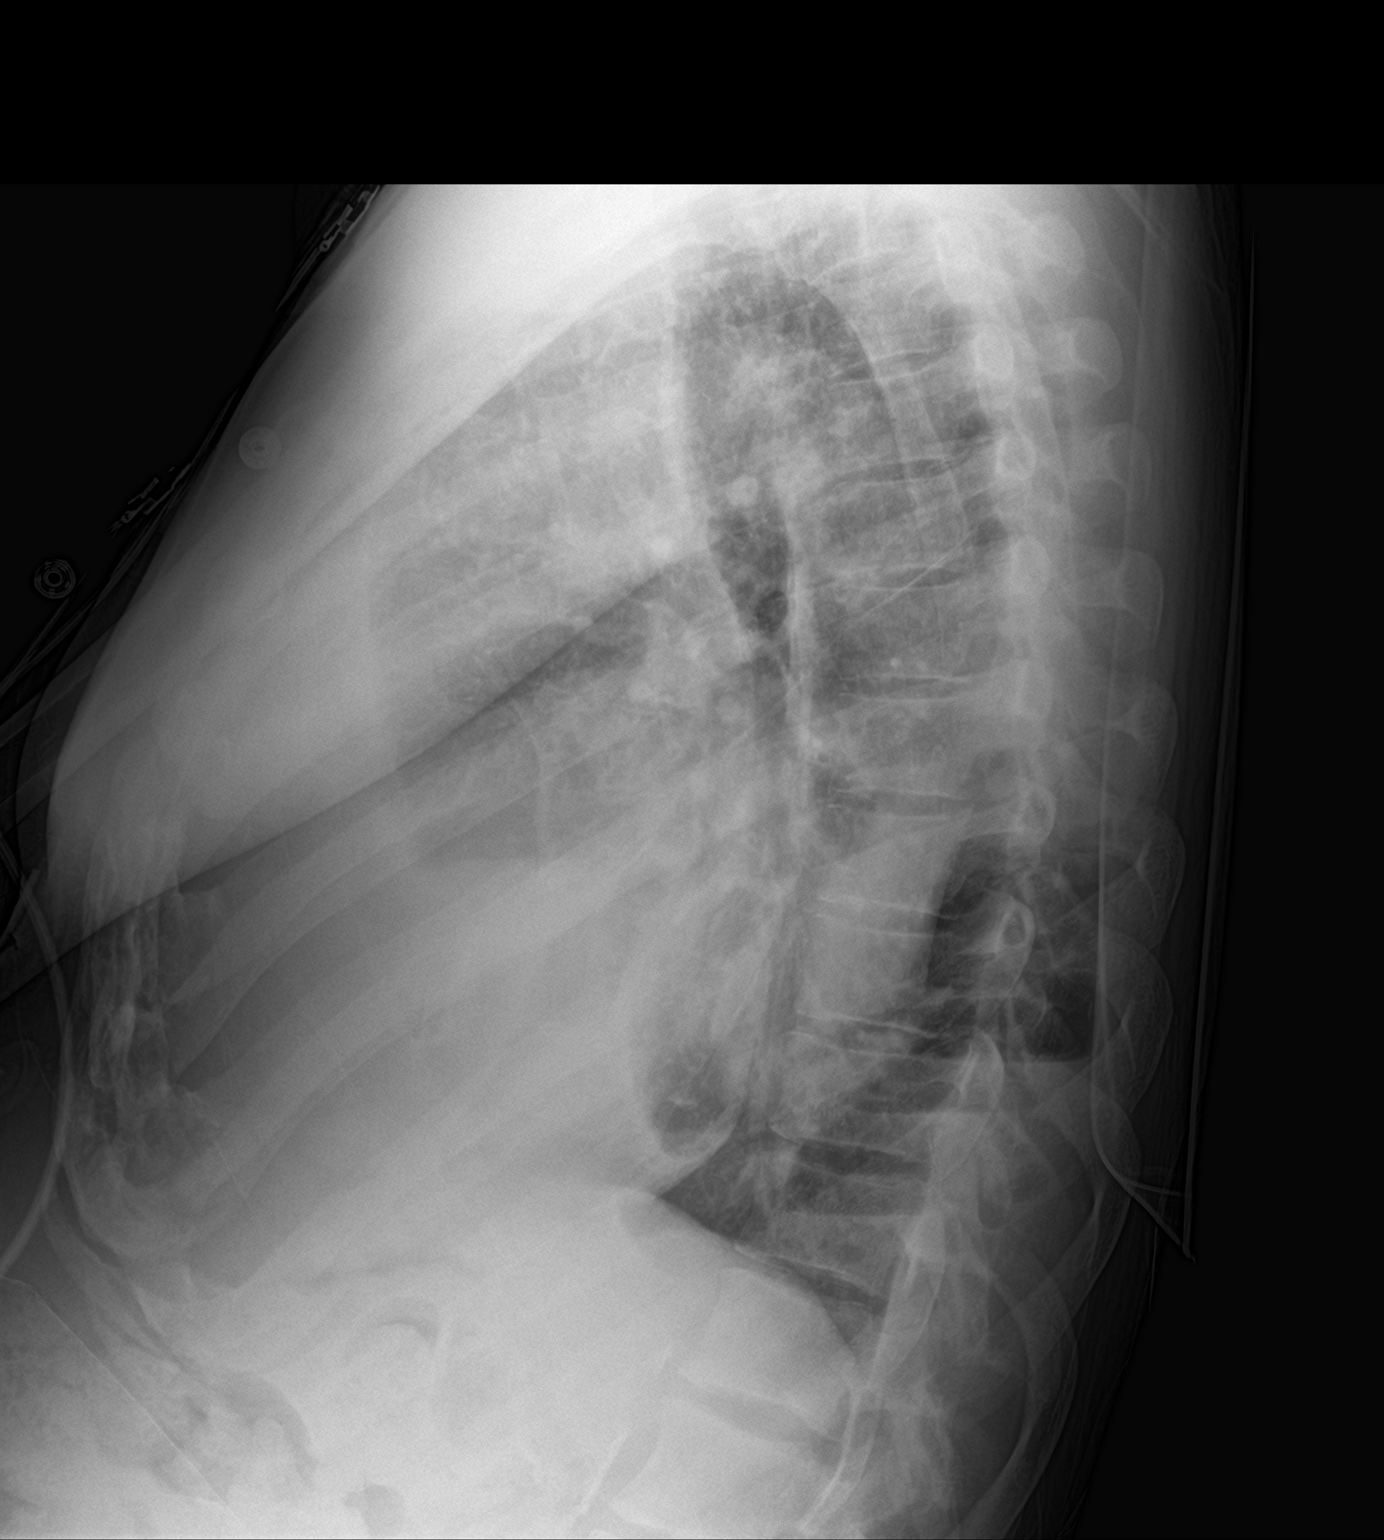

[chest ap]
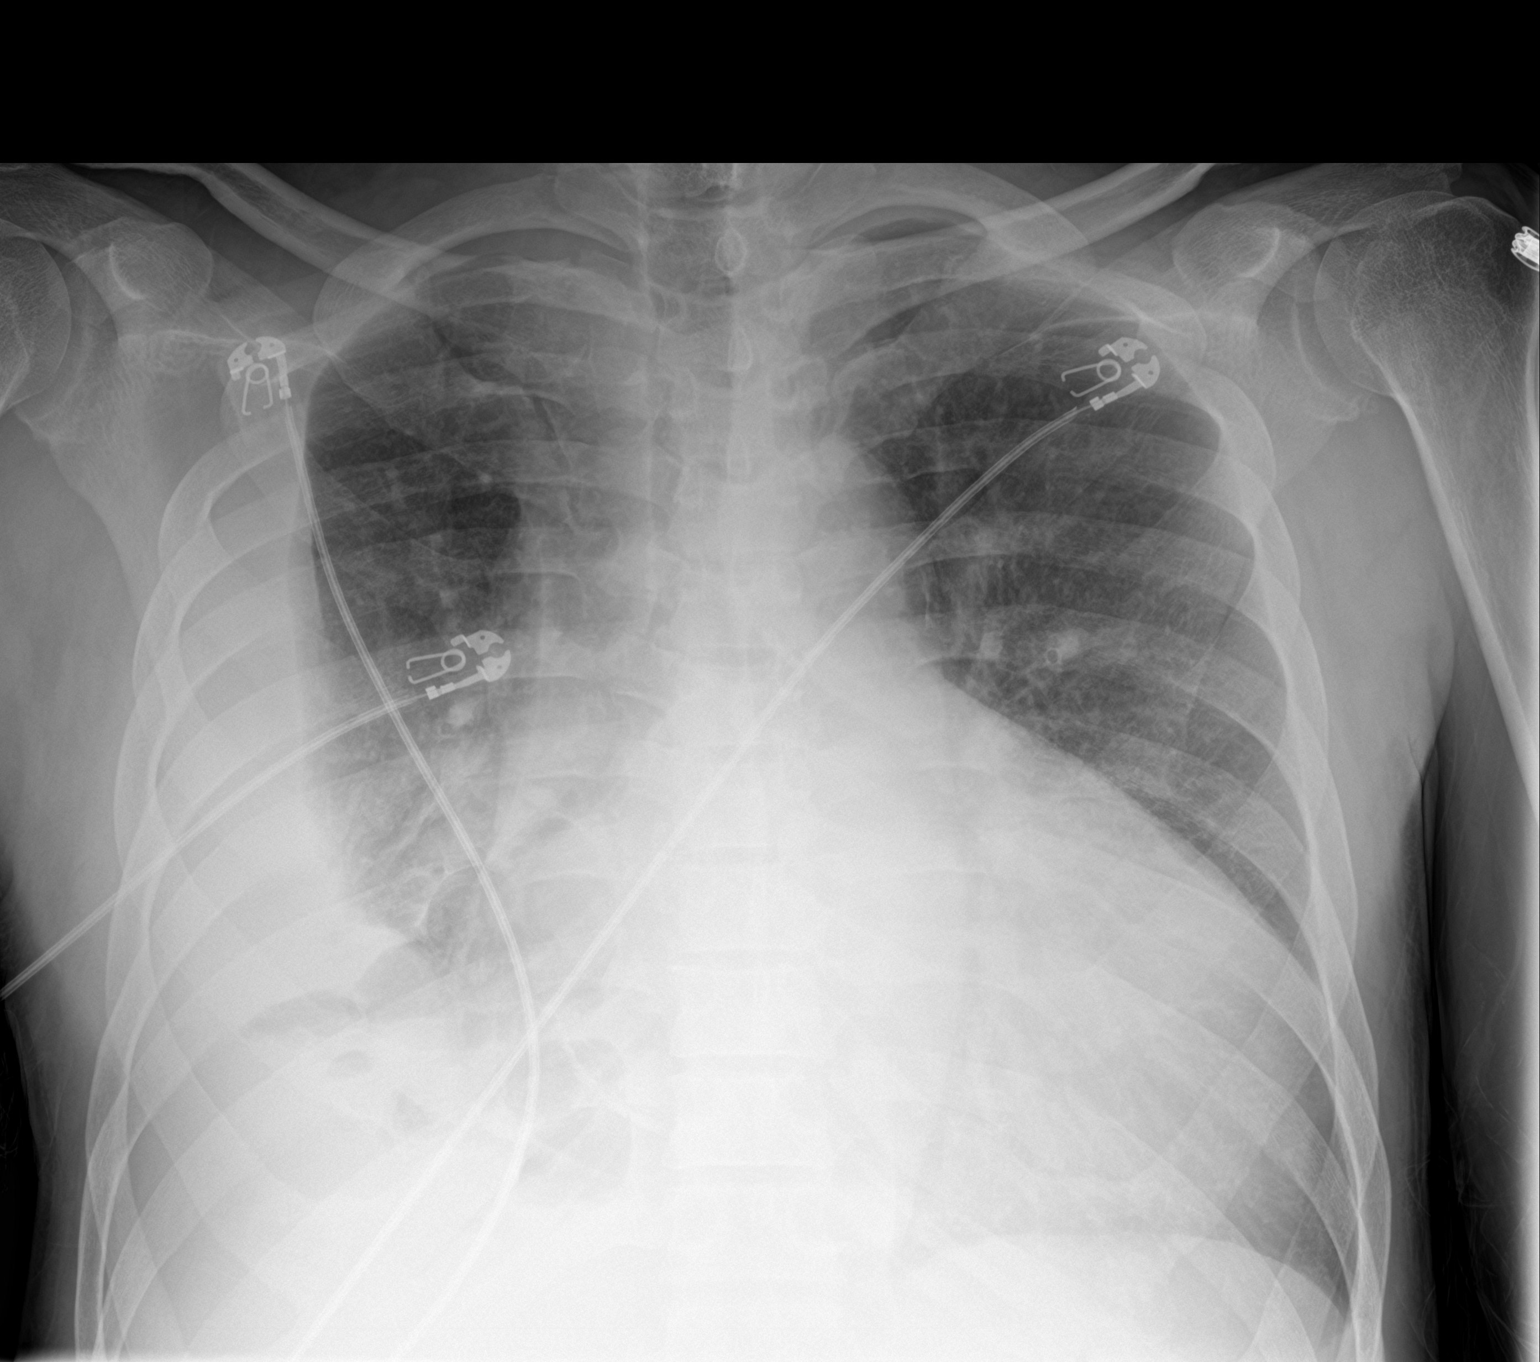

[2 of 2 positions shown; findings below may reference images not displayed]

FINDINGS: Stable cardiomegaly and diffuse interstitial edema pattern. Moderate
right pleural effusion is unchanged in size. Air-fluid levels again
seen in the lower hemithorax, which may be due to hydropneumothorax
or pulmonary cavitation.
IMPRESSION: Stable cardiomegaly and interstitial pulmonary edema.

Stable moderate right pleural effusion. Air-fluid levels may be
secondary to hydropneumothorax or pulmonary cavitation.

## 2020-11-10 IMAGING — DX DG CHEST 1V PORT
1 series · 1 of 1 positions shown · non-contrast
Comparison: 06/15/2018

CLINICAL DATA: Abdominal pain with vomiting over the last several
days. Dialysis patient. Shortness of breath.

EXAM:
PORTABLE CHEST 1 VIEW

[chest ap]
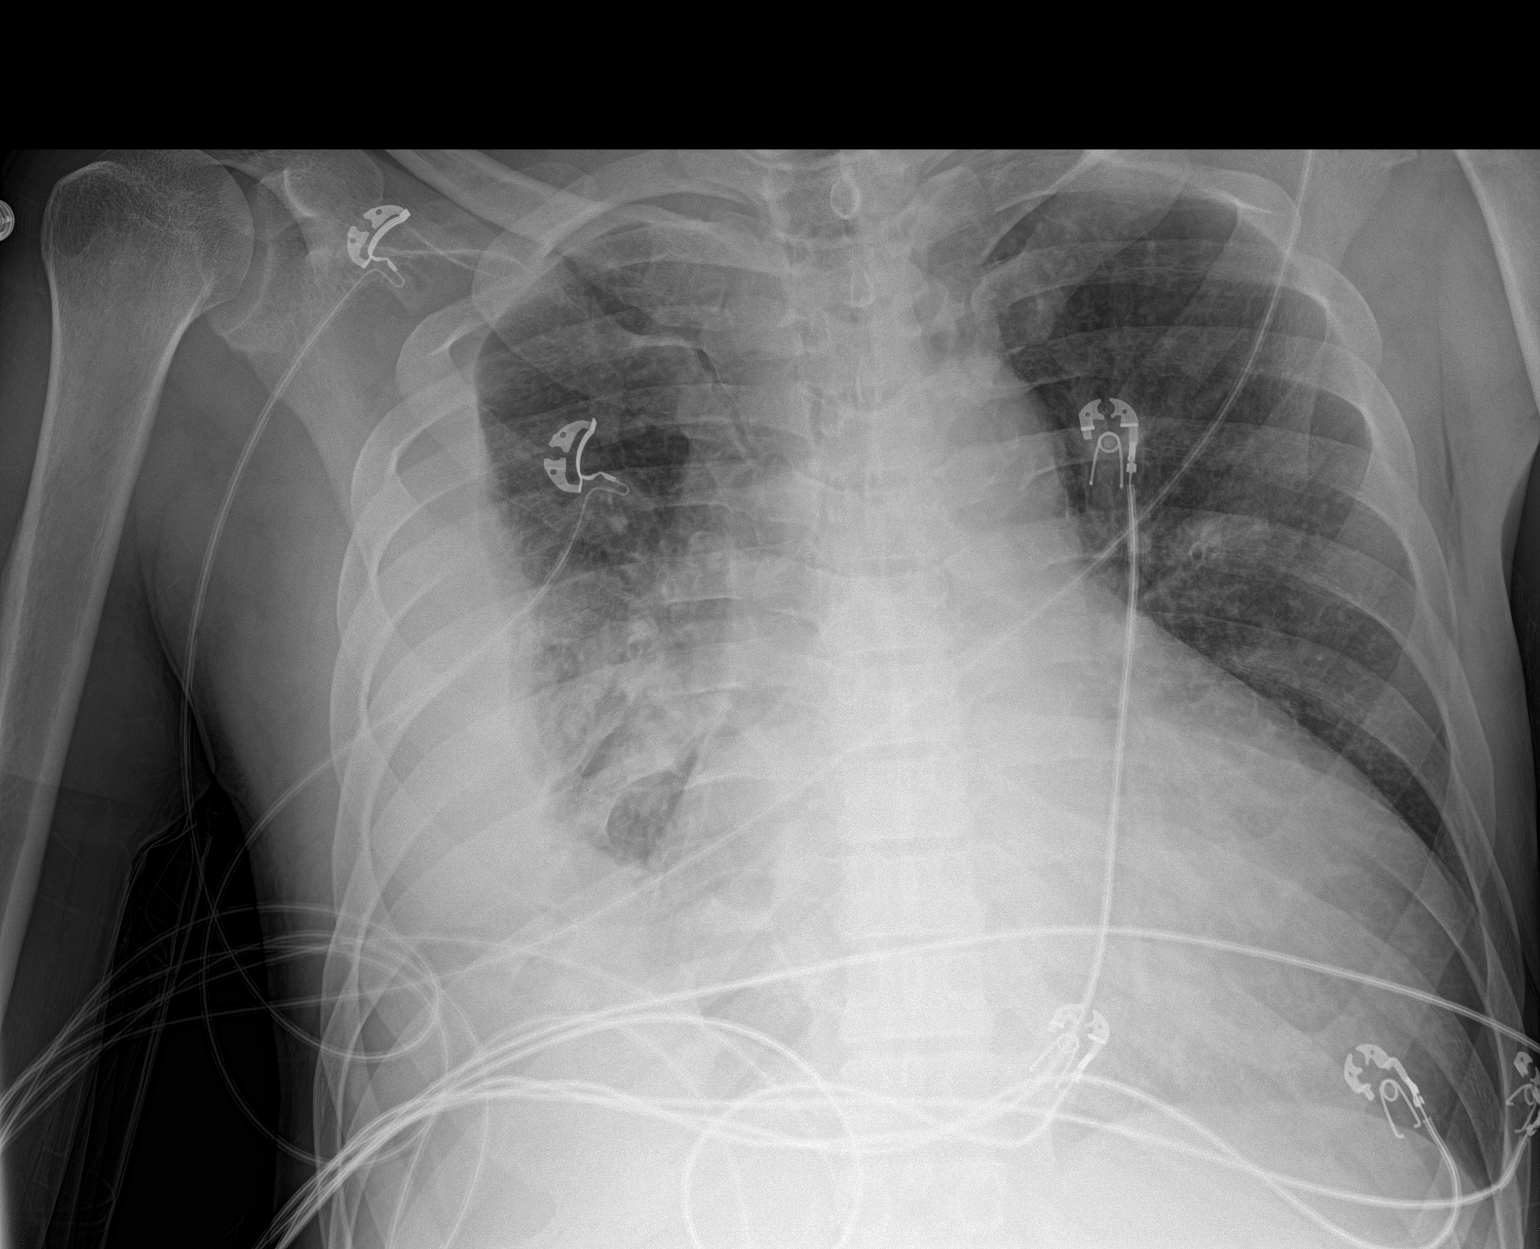

[1 of 1 positions shown; findings below may reference images not displayed]

FINDINGS: Markedly enlarged cardiac silhouette again demonstrated. The left
chest is clear. Large right effusion with some loculation as seen
previously. Associated atelectasis and or infiltrate within the
right lung. No worsening or new finding.
IMPRESSION: Very similar appearance to the study of 1 week ago. Cardiomegaly.
Right effusion, partially loculated, with right lung volume loss
and/or pneumonia.

## 2020-11-10 IMAGING — CT CT ABD-PELV W/O CM
2 of 4 series · 15 of 46 positions shown, 17 images · non-contrast
Comparison: 06/02/2018 and 05/09/2018, 06/24/2017

CLINICAL DATA: Abdominal pain and vomiting several days.  Dialysis.

EXAM:
CT ABDOMEN AND PELVIS WITHOUT CONTRAST
TECHNIQUE: Multidetector CT imaging of the abdomen and pelvis was performed
following the standard protocol without IV contrast.

[Series 3: a/p w/o 5mm · axial · non-contrast · 0.87mm/px · z∈[+707,+1152]mm · 12 of 99 slices shown, 14 images]
[im 5/99  soft-tissue]
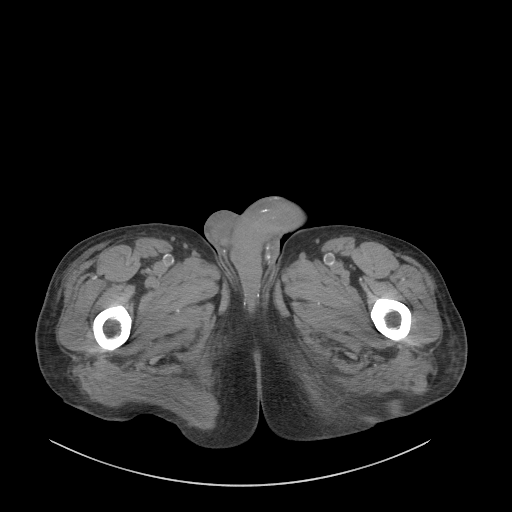
[im 5/99  bone]
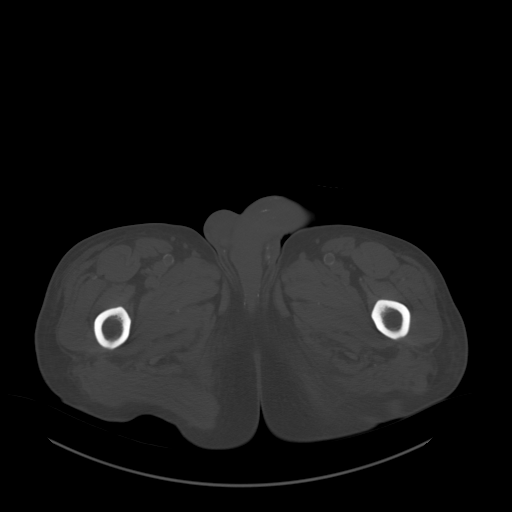
[im 13/99  soft-tissue]
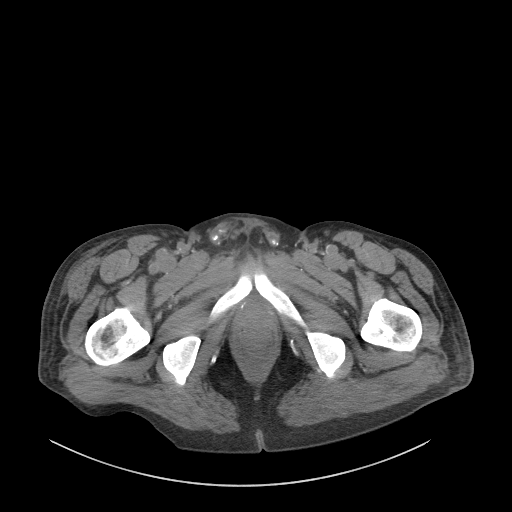
[im 21/99  soft-tissue]
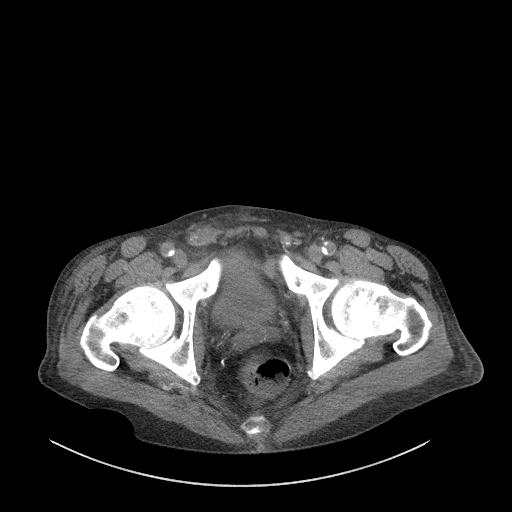
[im 29/99  soft-tissue]
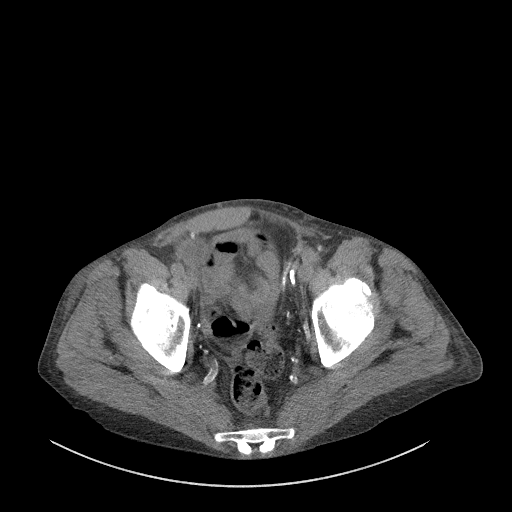
[im 37/99  soft-tissue]
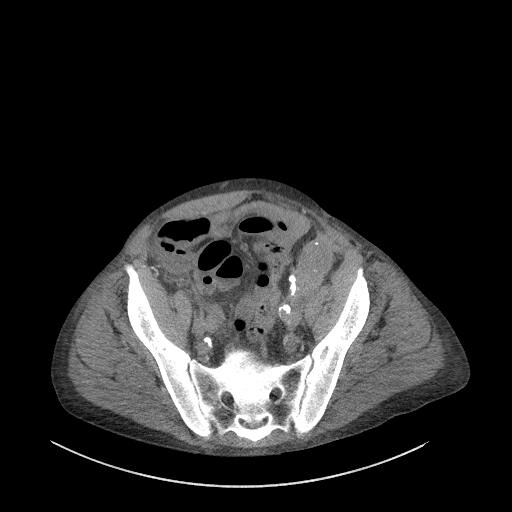
[im 45/99  soft-tissue]
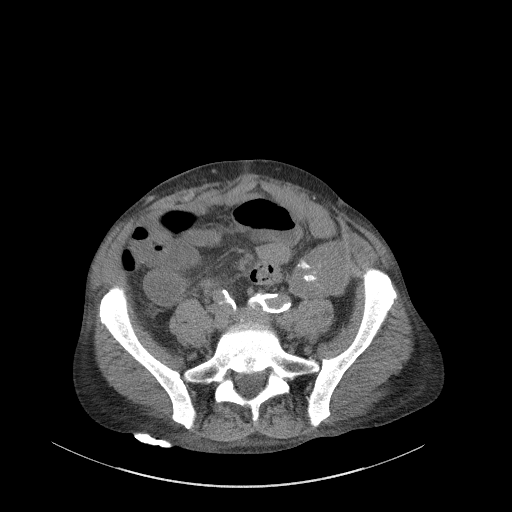
[im 54/99  soft-tissue]
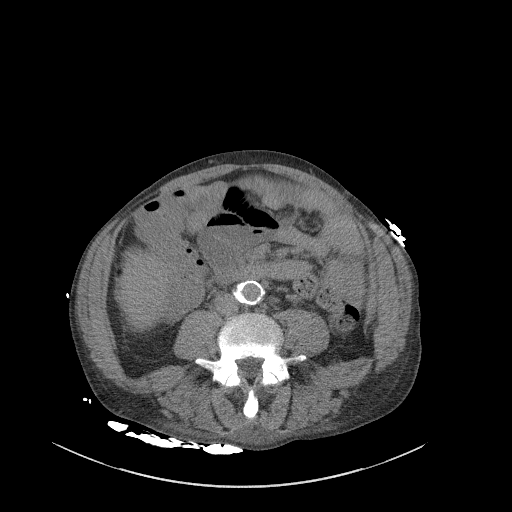
[im 62/99  soft-tissue]
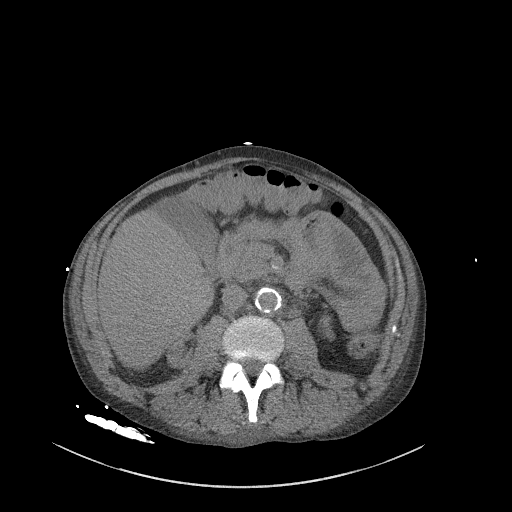
[im 70/99  soft-tissue]
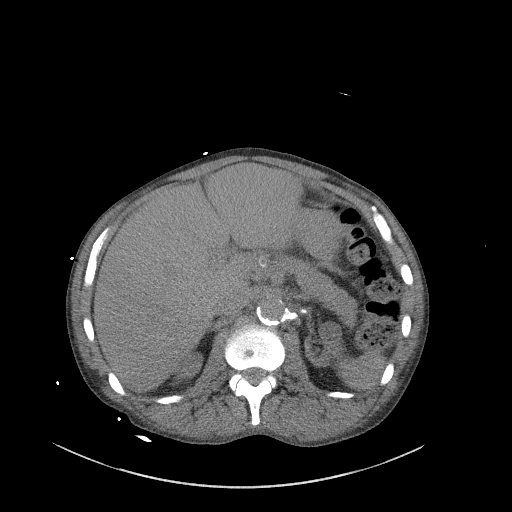
[im 70/99  bone]
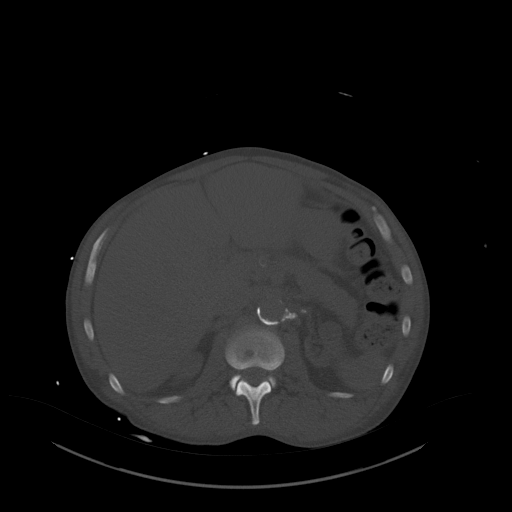
[im 78/99  soft-tissue]
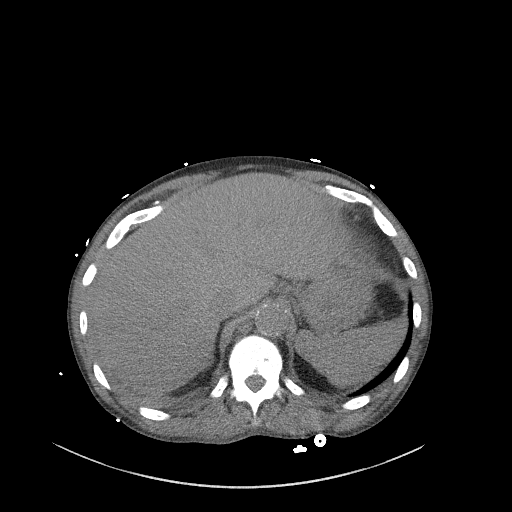
[im 86/99  soft-tissue]
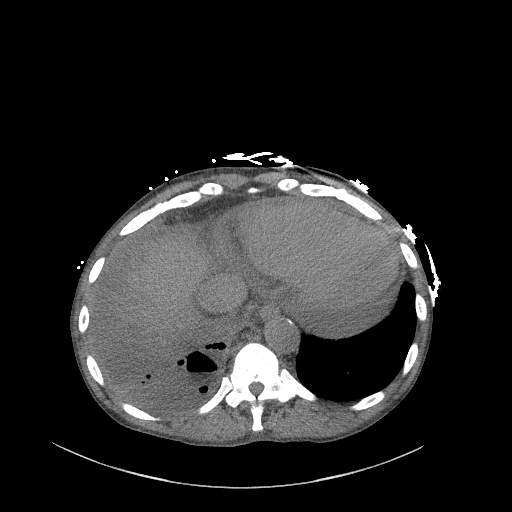
[im 94/99  soft-tissue]
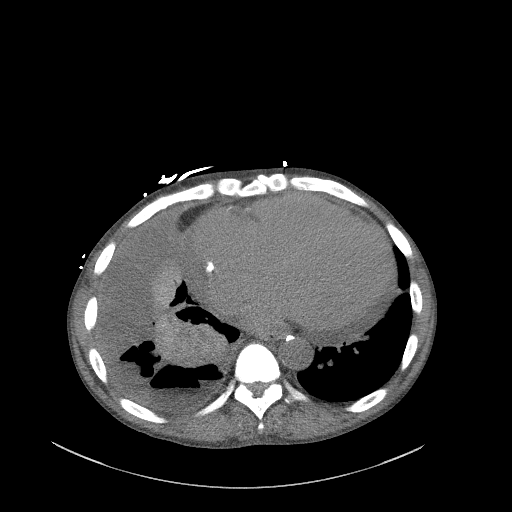

[Series 6: a/p w/o cor · coronal · non-contrast · 0.71mm/px · 3 of 148 slices shown]
[im 50/148  soft-tissue]
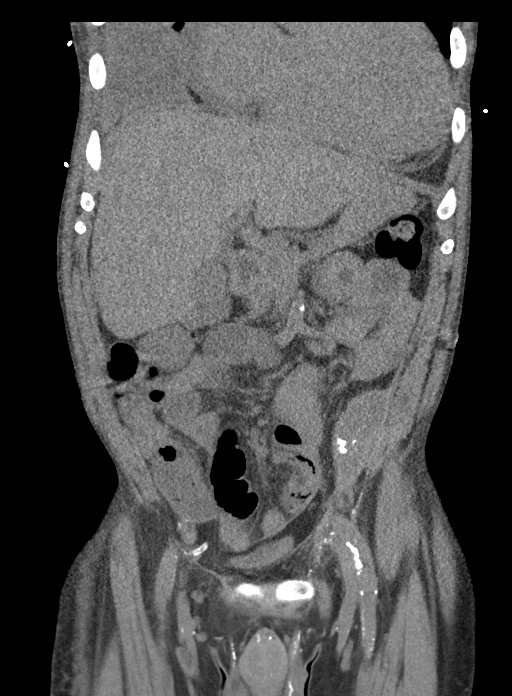
[im 66/148  soft-tissue]
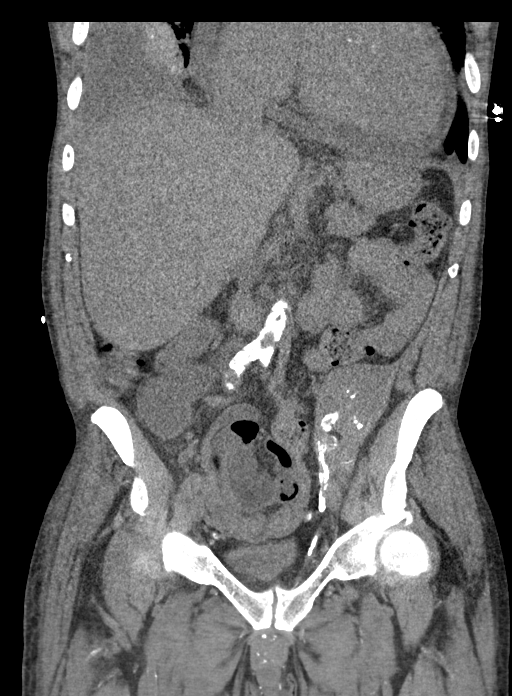
[im 82/148  soft-tissue]
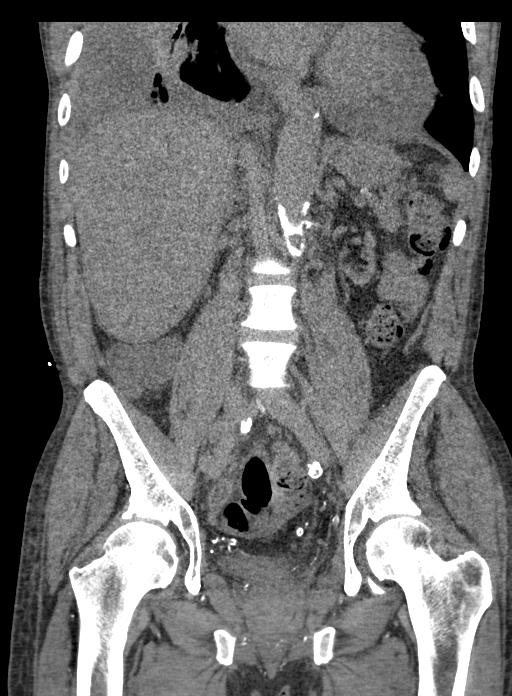

[15 of 46 positions shown; findings below may reference images not displayed]

FINDINGS: Lower chest: Subtle reticulonodular density over the posterior left
lower lobe. Moderate compressive atelectasis over the right lower
lobe without significant change. Stable moderate size right pleural
effusion with new collections of air and air-fluid levels within
this pleural fluid collection. Findings may be due to bronchopleural
fistula or empyema/infection. Stable cardiomegaly with stable small
to moderate pericardial effusion. Calcified plaque over the right
coronary artery. Calcified plaque over the descending thoracic
aorta.

Hepatobiliary: Gallbladder, liver and biliary tree are within
normal.

Pancreas: Normal.

Spleen: Normal.

Adrenals/Urinary Tract: Adrenal glands are normal. Bilateral
atrophic kidneys with stable cyst over the upper pole left kidney.
There is a more indeterminate solid appearing mass over the mid pole
left kidney measuring 2.7 x 3 cm without significant change from the
recent prior exam although smaller compared to 9603. Ureters and
bladder are normal. Stable failed left lower quadrant/pelvic renal
transplant.

Stomach/Bowel: Stomach is unremarkable. Mild small bowel wall
thickening over the left abdomen nonspecific. Single mildly dilated
small bowel loop over the right mid abdomen. Appendix not well
visualized. Colon is unremarkable..

Vascular/Lymphatic: Moderate calcified plaque over the abdominal
aorta and iliac arteries. No adenopathy.

Reproductive: Unremarkable.

Other: No significant free fluid or focal inflammatory change.

Musculoskeletal: Unchanged.
IMPRESSION: Single minimally dilated small bowel loop over the right mid to
lower abdomen with a few thick-walled small bowel loops in the left
abdomen. Findings nonspecific and may be due to regional enteritis
of infectious or inflammatory nature.

Moderate size right pleural effusion with new foci of pleural air
and pleural air-fluid levels suggesting bronchopleural fistula or
empyema/infection. Right basilar atelectasis. Mild reticulonodular
opacification over the left lower lobe which may be due to infection
or inflammatory process.

Cardiomegaly with small to moderate pericardial effusion unchanged.

Atrophic kidneys with stable 3 cm indeterminate left renal mass
possibly representing indolent neoplasm as suggested previously.
Left renal cyst. Stable partially calcified left lower
quadrant/pelvic transplant kidney.

Aortic Atherosclerosis ([id number]). Atherosclerotic coronary
artery disease.

## 2020-12-13 IMAGING — DX DG CHEST 2V
2 series · 2 of 2 positions shown · non-contrast
Comparison: Most recent chest radiograph 06/22/2018. Lung bases
from abdominal CT 06/22/2018

CLINICAL DATA: Chest pain.

EXAM:
CHEST - 2 VIEW

[chest lat]
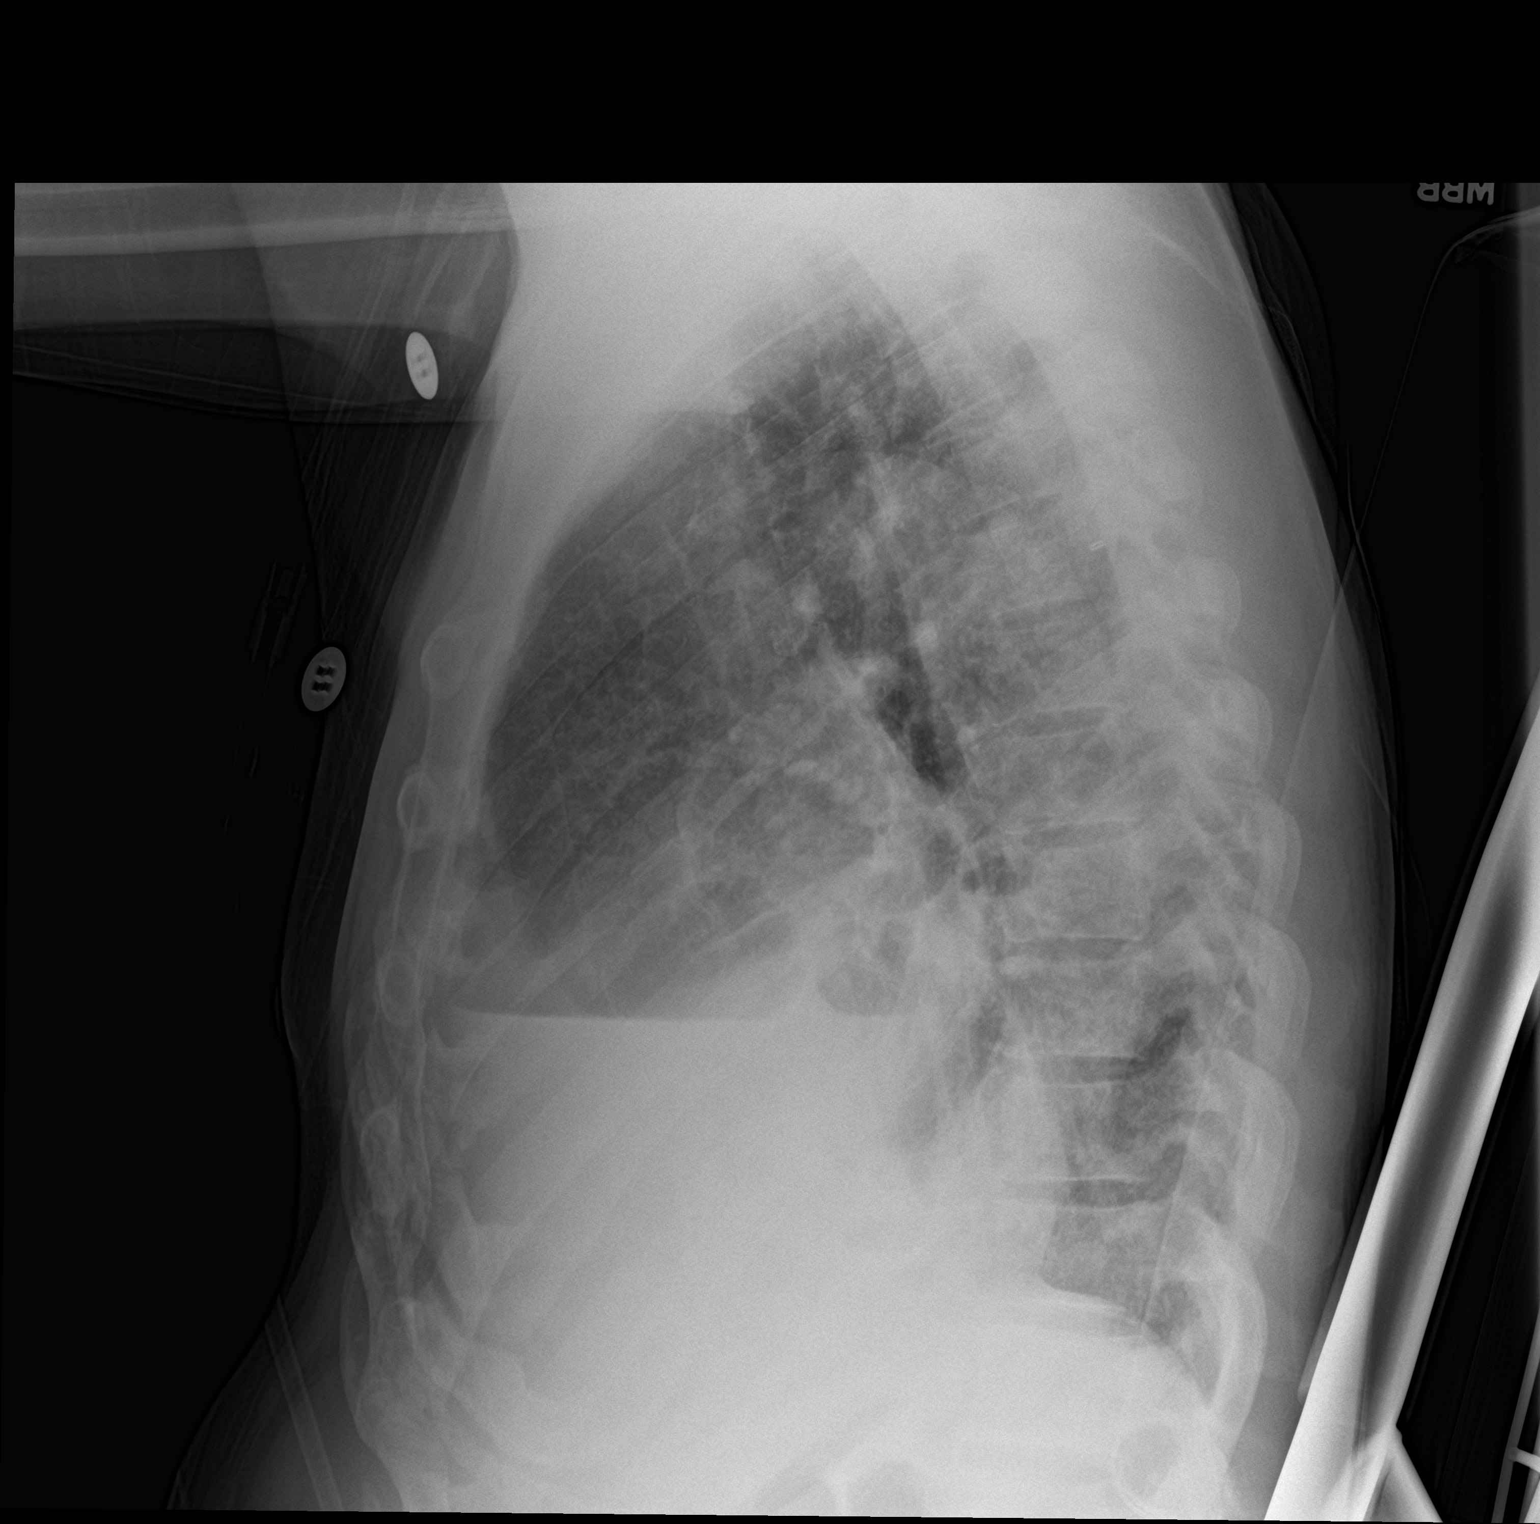

[chest ap]
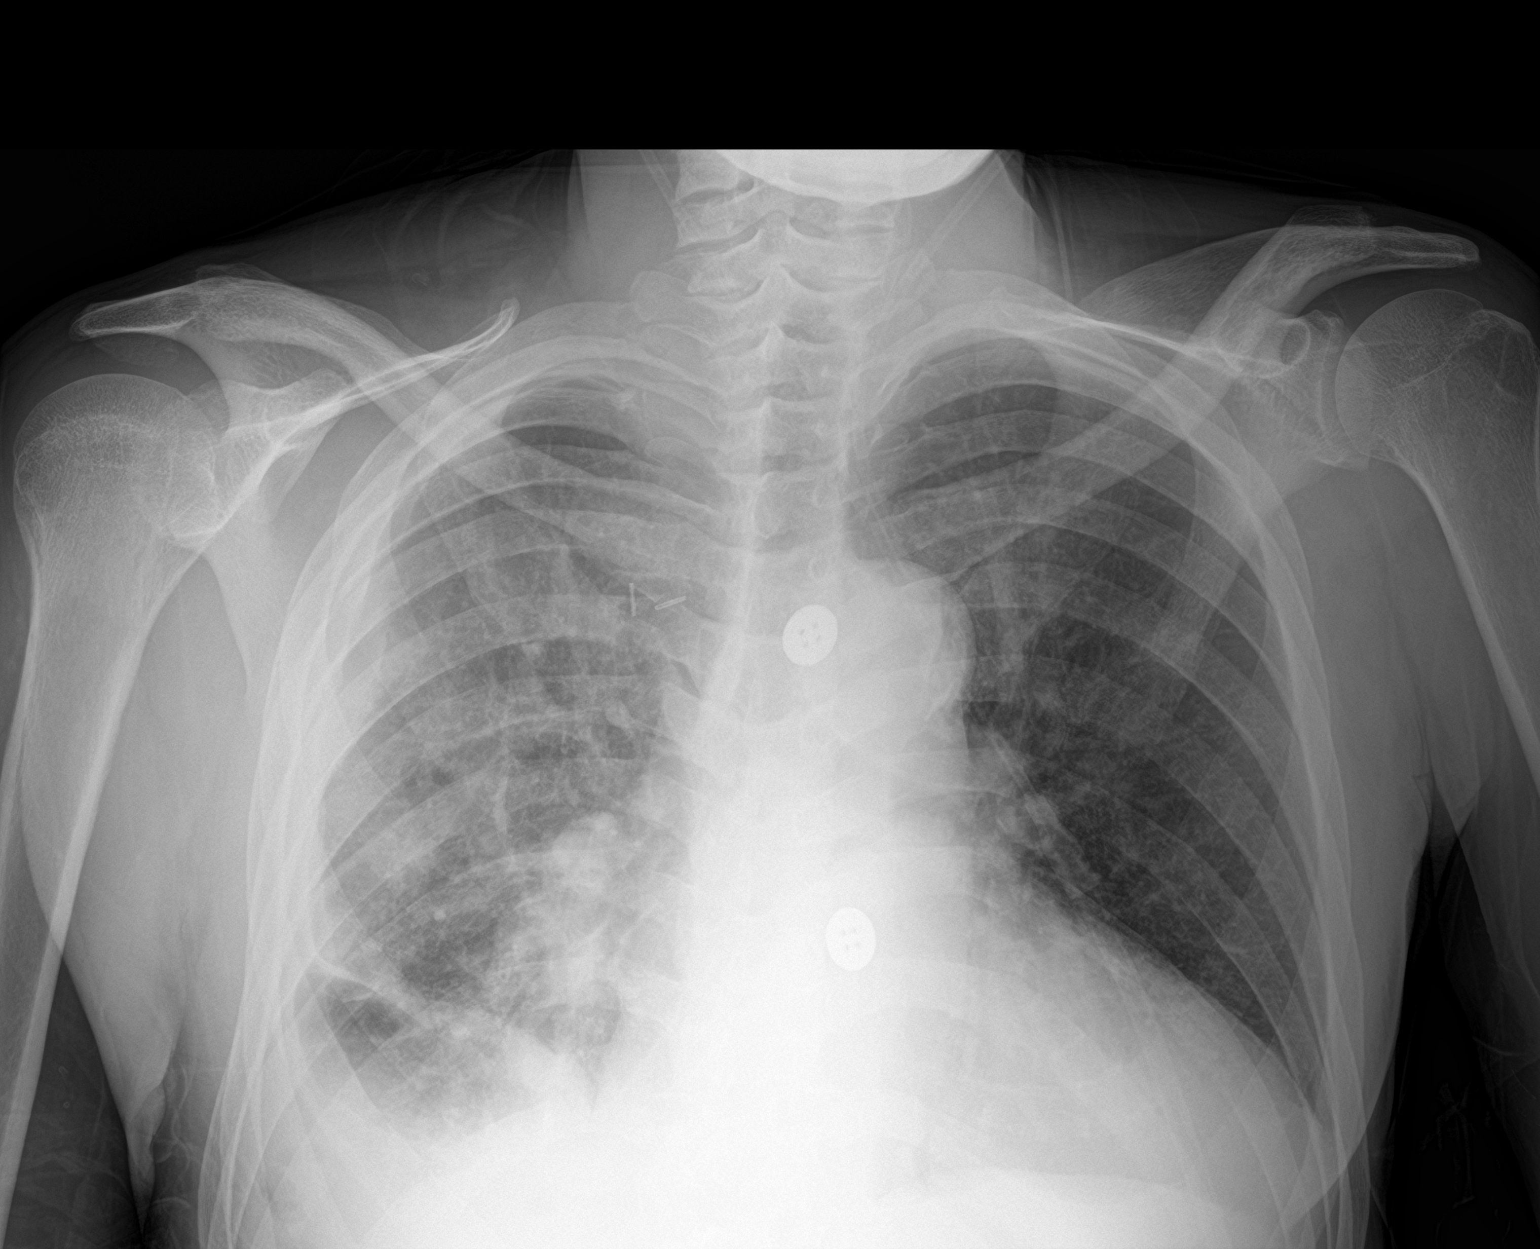

[2 of 2 positions shown; findings below may reference images not displayed]

FINDINGS: Decreased right pleural effusion from prior radiograph, partially
loculated small volume of pleural fluid remains. Possible partially
loculated extrapleural air at the right lung base, not well assessed
radiographically. Patchy opacities throughout the right hemithorax.
Cardiomegaly is unchanged. Mild interstitial edema suspected.
IMPRESSION: 1. Decreased right pleural effusion from prior radiograph, residual
partially loculated pleural effusion persists. Possible extrapleural
air component at the right lung base, as seen from included lung
bases on abdominal CT 06/22/2018.
[DATE]. Chronic cardiomegaly with mild interstitial edema. Patchy
opacities throughout the right hemithorax may represent asymmetric
edema or superimposed infection.

## 2020-12-13 IMAGING — CT CT CHEST W/O CM
2 of 4 series · 14 of 36 positions shown, 17 images · non-contrast
Comparison: 01/31/2018 CT chest

Correlation: CT abdomen and pelvis 06/22/2018

CLINICAL DATA: Shortness of breath; history CHF, end-stage renal
disease on dialysis, non ischemic cardiomyopathy, hypertension,
underwent thoracotomy for empyema in June 2018.

EXAM:
CT CHEST WITHOUT CONTRAST
TECHNIQUE: Multidetector CT imaging of the chest was performed following the
standard protocol without IV contrast. IV contrast was not
administered since no adequate IV access could be established.
Sagittal and coronal MPR images reconstructed from axial data set.

[Series 3: chest w/o 2mm st · axial · non-contrast · 0.80mm/px · z∈[+1166,+1454]mm · 11 of 170 slices shown, 14 images]
[im 13/170  mediastinal]
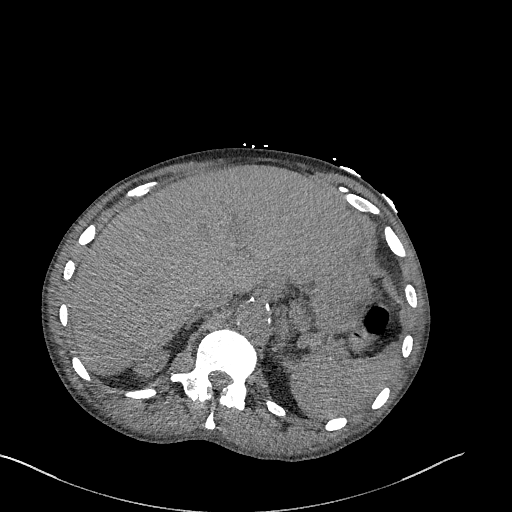
[im 13/170  lung]
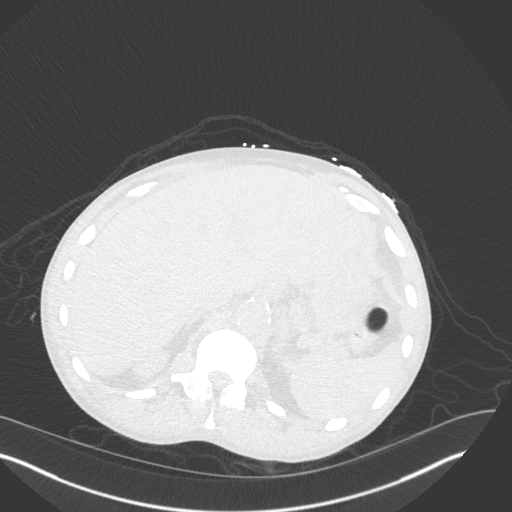
[im 25/170  lung]
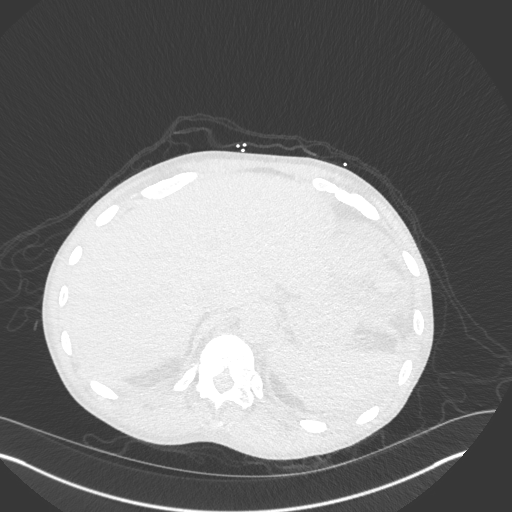
[im 37/170  lung]
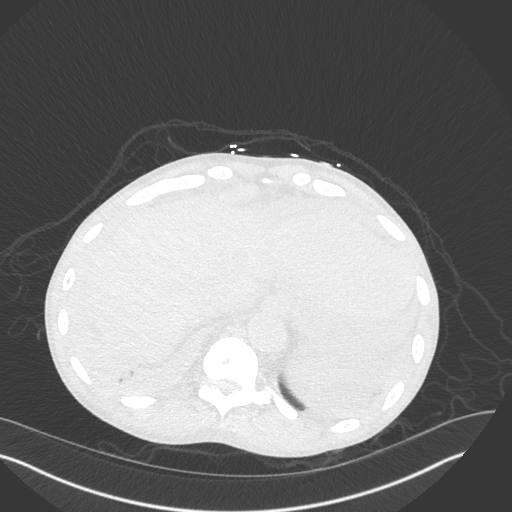
[im 61/170  lung]
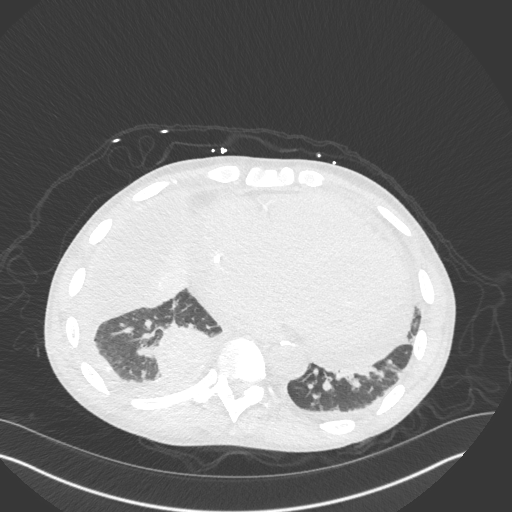
[im 73/170  mediastinal]
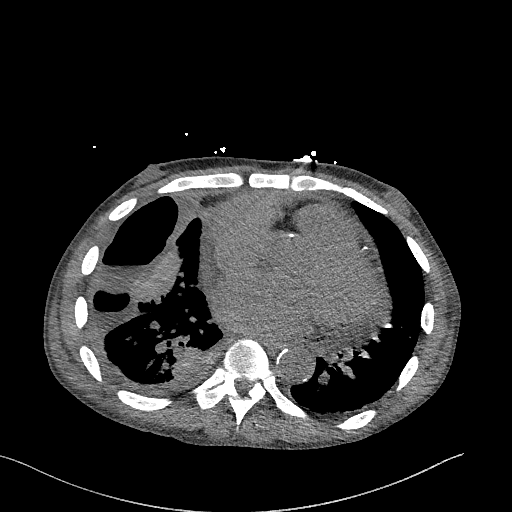
[im 73/170  lung]
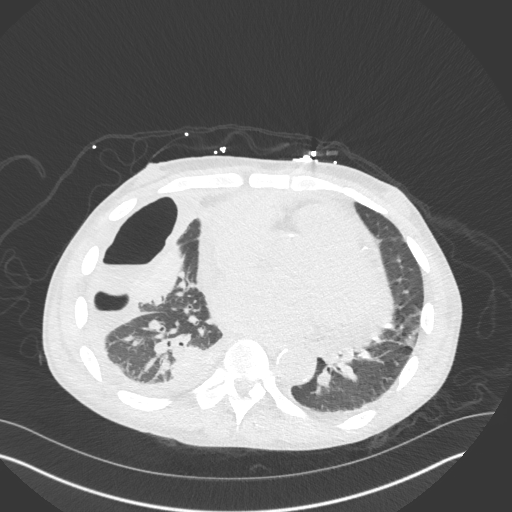
[im 85/170  lung]
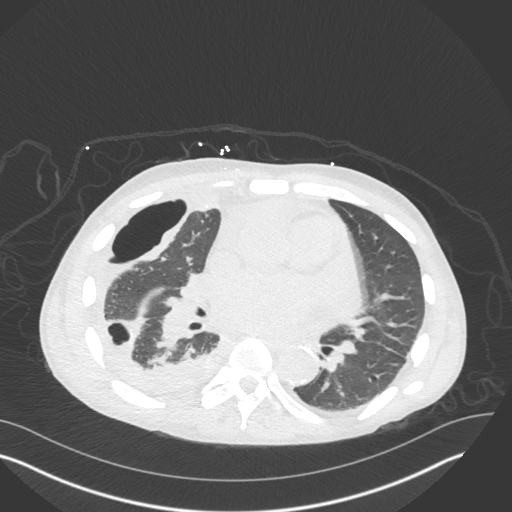
[im 97/170  lung]
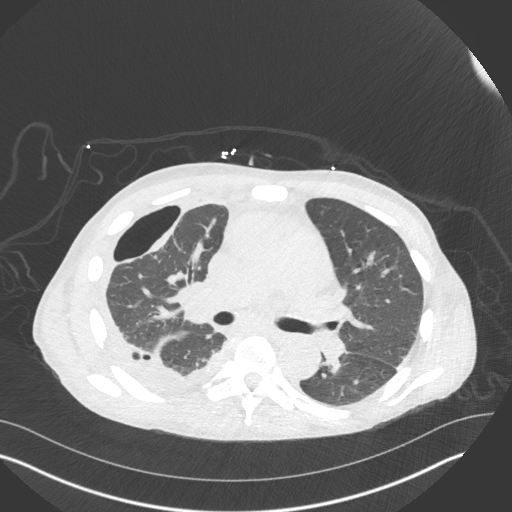
[im 109/170  lung]
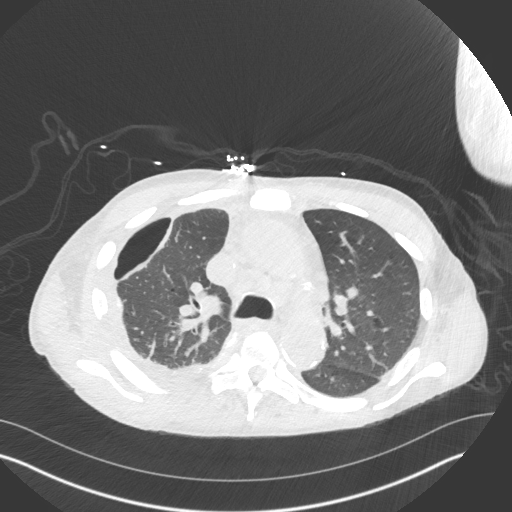
[im 133/170  mediastinal]
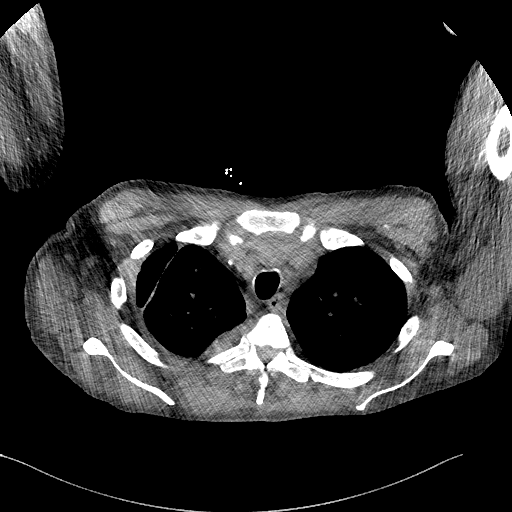
[im 133/170  lung]
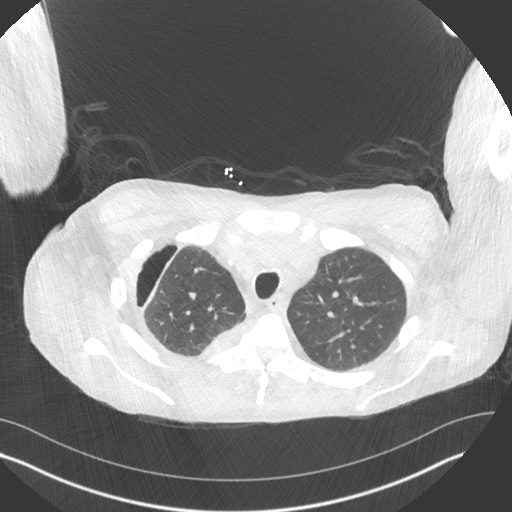
[im 145/170  lung]
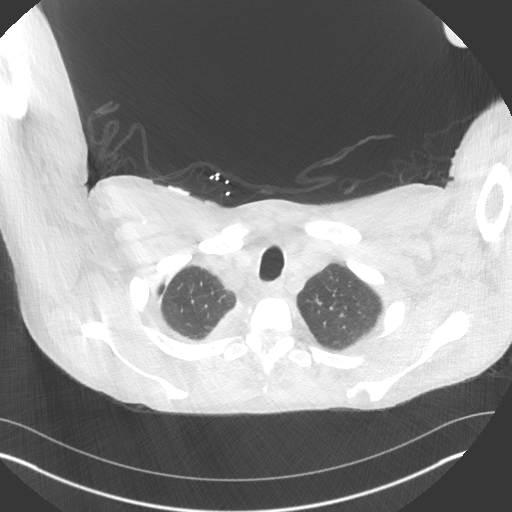
[im 157/170  lung]
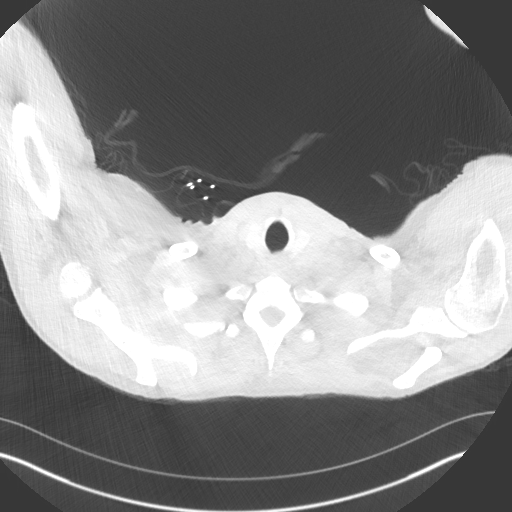

[Series 5: chest w/o 3mm st cor · coronal · non-contrast · 0.66mm/px · 3 of 101 slices shown]
[im 21/101  lung]
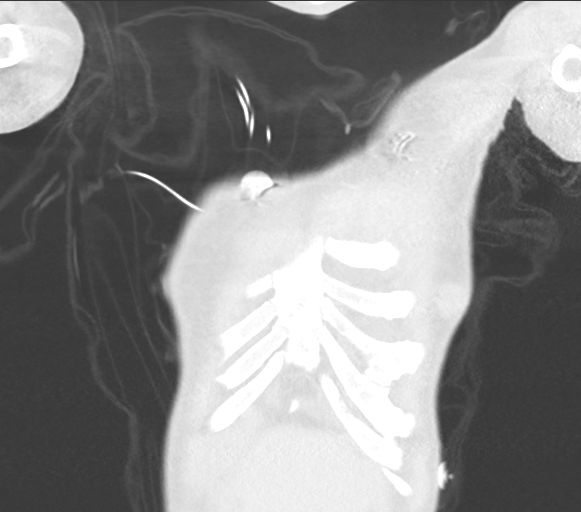
[im 41/101  lung]
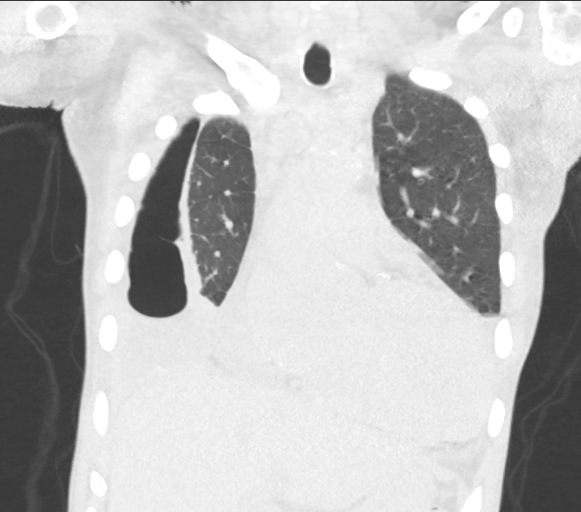
[im 61/101  lung]
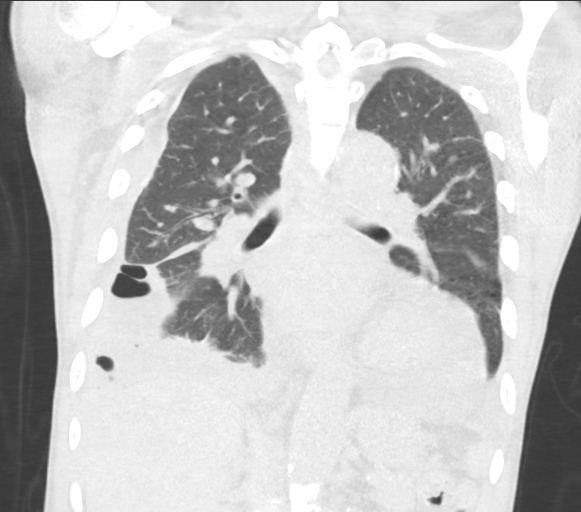

[14 of 36 positions shown; findings below may reference images not displayed]

FINDINGS: Cardiovascular: Scattered atherosclerotic calcifications of aorta,
coronary arteries and proximal great vessels. Enlargement of cardiac
chambers. Small to moderate sized pericardial effusion slightly
increased from previous exam. Upper normal caliber ascending
thoracic aorta.

Mediastinum/Nodes: Mildly enlarged precarinal lymph node 12 mm short
axis image 64. Additional AP window node 14 mm short axis image 64.
Normal sized RIGHT paratracheal and prevascular nodes. Base of
cervical region normal appearance. Esophagus unremarkable.

Lungs/Pleura: Central peribronchial thickening. Consolidation versus
mass RIGHT lower lobe 4.7 x 3.8 cm image 111. Scattered pleural
effusion and pleural thickening throughout lateral and posterior
RIGHT hemithorax with loculated hydropneumothorax at the
anterolateral RIGHT chest, likely representing residual or recurrent
empyema in a patient with recent empyema. Basilar portion of RIGHT
has been present since the prior CT exam from June 2018, though
the cranial extent of this was not previously imaged. Compressive
atelectasis in RIGHT upper and RIGHT middle lobes.

Upper Abdomen: Atrophic upper kidneys. Probable cyst upper pole LEFT
kidney 20 x 20 mm. Remaining visualized upper abdomen unremarkable.

Musculoskeletal: Diffuse osseous sclerosis likely renal
osteodystrophy in patient with history of renal failure.
IMPRESSION: Pleural effusion and pleural thickening in the RIGHT hemithorax with
a large loculated anterolateral hydropneumothorax in the RIGHT
chest, favor recurrent empyema in patient with recent empyema and
surgery; recommend correlation with fluid analysis.

At least the basilar portion of the RIGHT hydropneumothorax has been
present since June 2011 though full extent has not been
previously imaged.

Extensive peribronchial thickening with rounded area of opacity in
the RIGHT lower lobe approximately 4.7 x 3.8 cm question rounded
pneumonia versus neoplasm.

Enlargement of cardiac chambers with small to moderate-sized
pericardial effusion increased since previous exam.

Aortic Atherosclerosis ([id number].E).

Findings called to the patient's nurse Donavon RN on 5C on 07/26/2018
at 1199 hrs.

## 2020-12-16 IMAGING — CT CT IMAGE GUIDED DRAINAGE BY PERCUTANEOUS CATHETER
1 of 4 series · 11 of 32 positions shown, 17 images · non-contrast
Comparison: none

INDICATION: 52-year-old male with a history of prior right-sided decortication
and recurrent hydropneumothorax.
TECHNIQUE: Informed written consent was obtained from the patient after a
thorough discussion of the procedural risks, benefits and
alternatives. All questions were addressed. Maximal Sterile Barrier
Technique was utilized including caps, mask, sterile gowns, sterile
gloves, sterile drape, hand hygiene and skin antiseptic. A timeout
was performed prior to the initiation of the procedure.

[Series 3: i-spiral 5.0 b40f · axial · 0.62mm/px · z∈[+1272,+1517]mm · 11 of 86 slices shown, 17 images]
[im 8/86  soft-tissue]
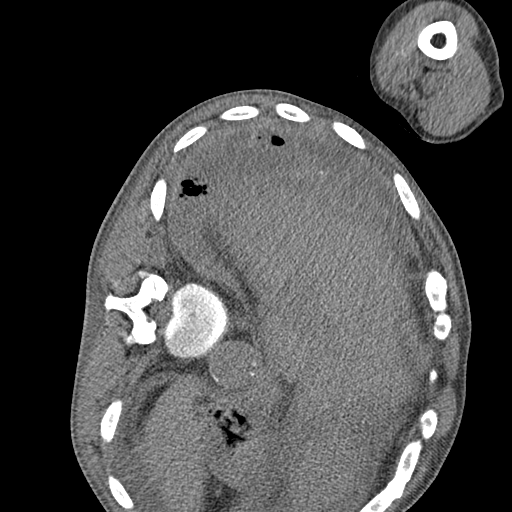
[im 8/86  bone]
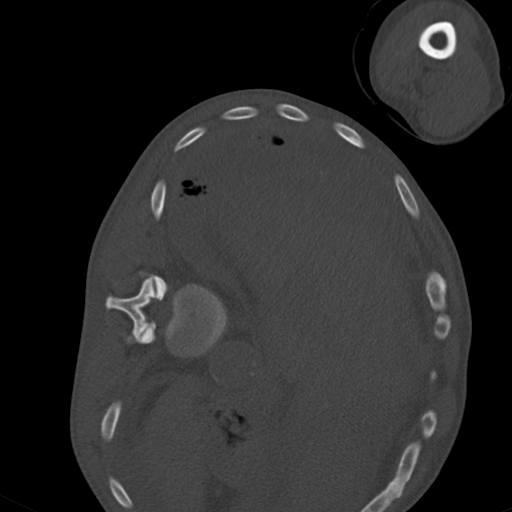
[im 15/86  soft-tissue]
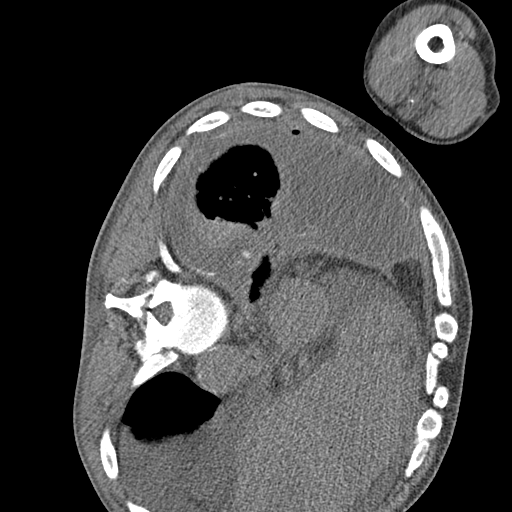
[im 22/86  soft-tissue]
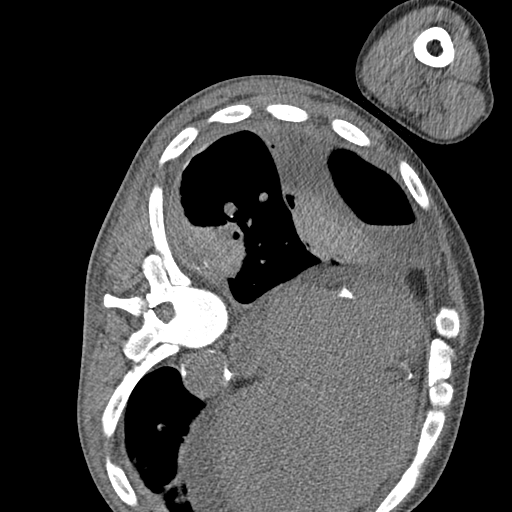
[im 29/86  soft-tissue]
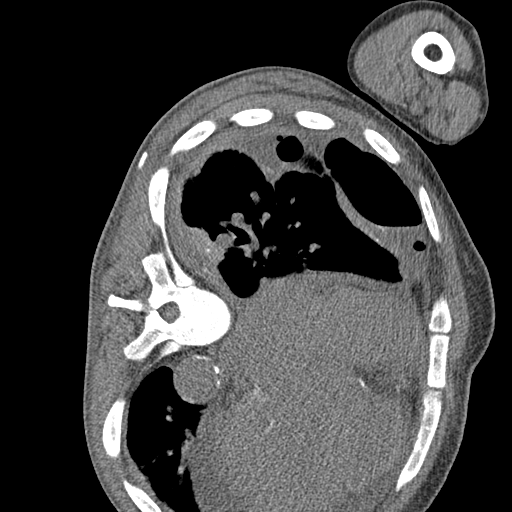
[im 36/86  soft-tissue]
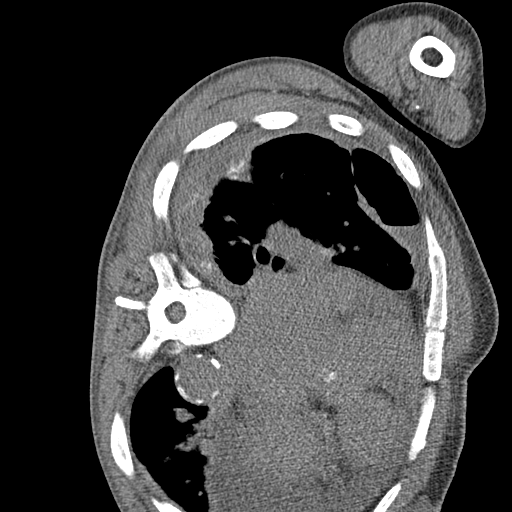
[im 43/86  soft-tissue]
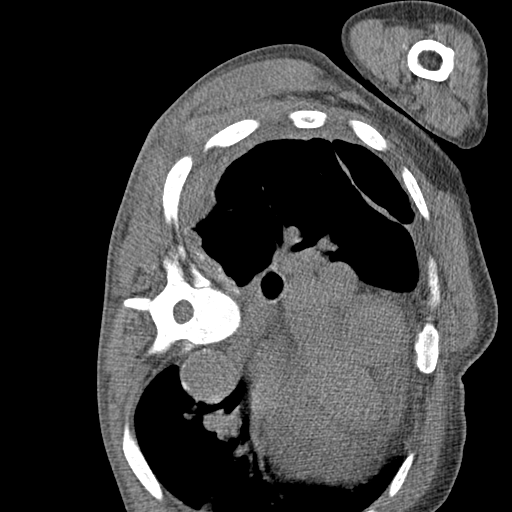
[im 50/86  soft-tissue]
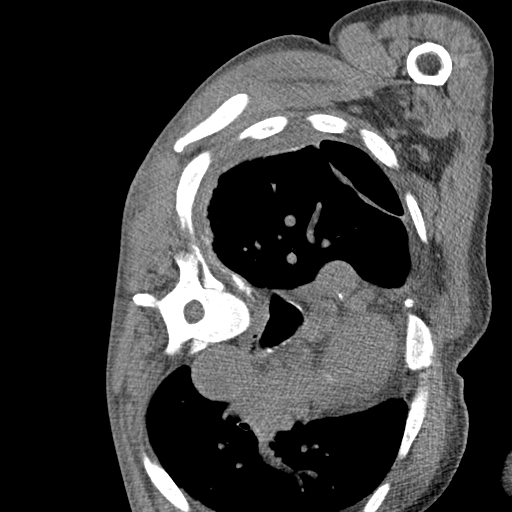
[im 57/86  soft-tissue]
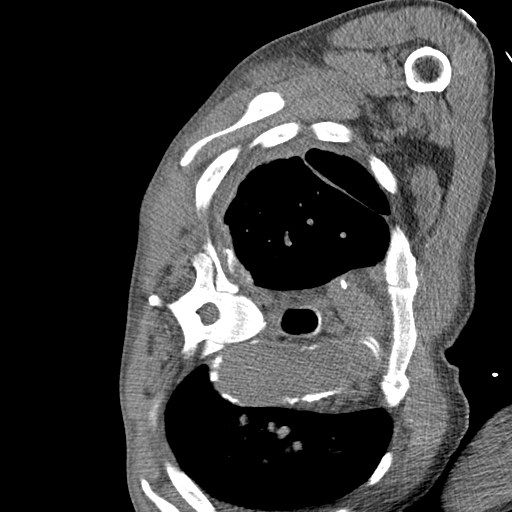
[im 57/86  lung]
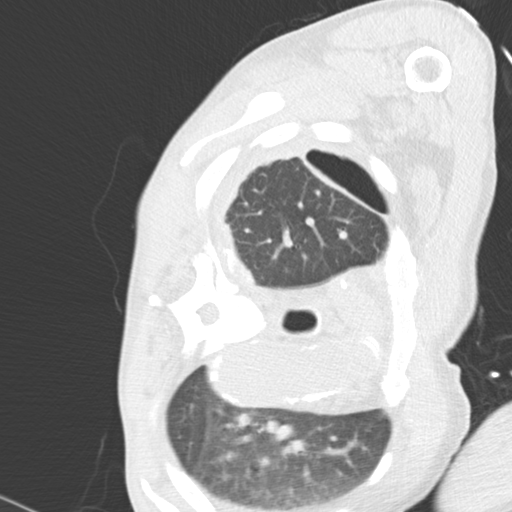
[im 64/86  soft-tissue]
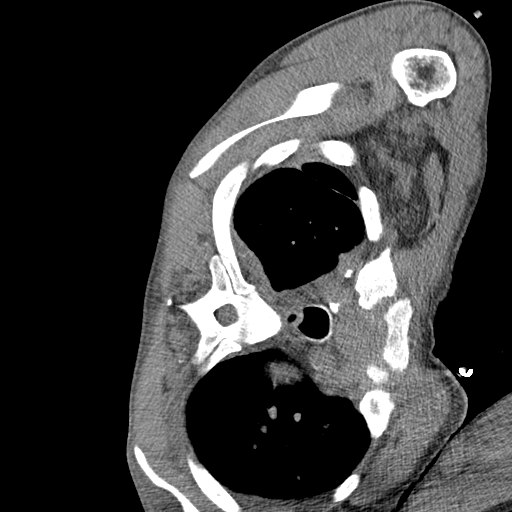
[im 64/86  lung]
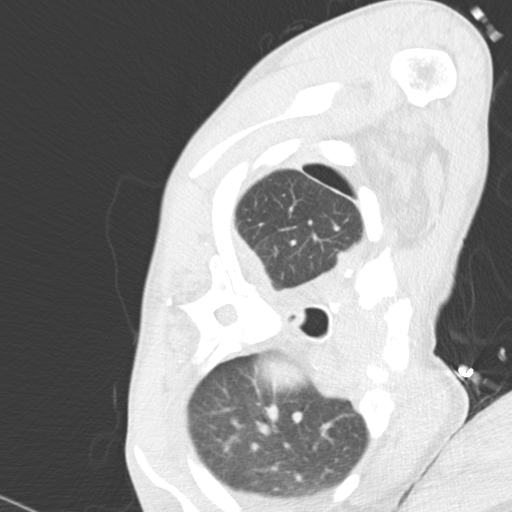
[im 64/86  bone]
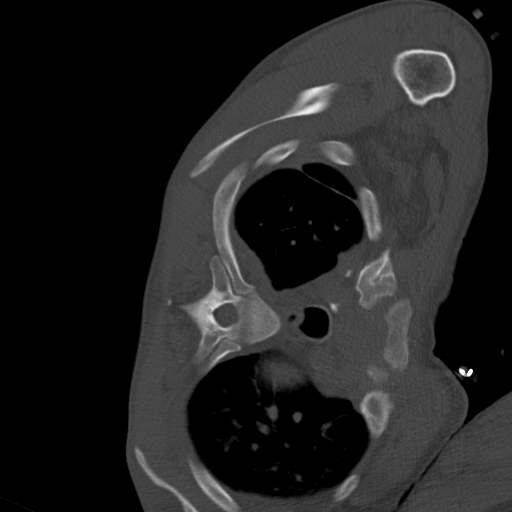
[im 71/86  soft-tissue]
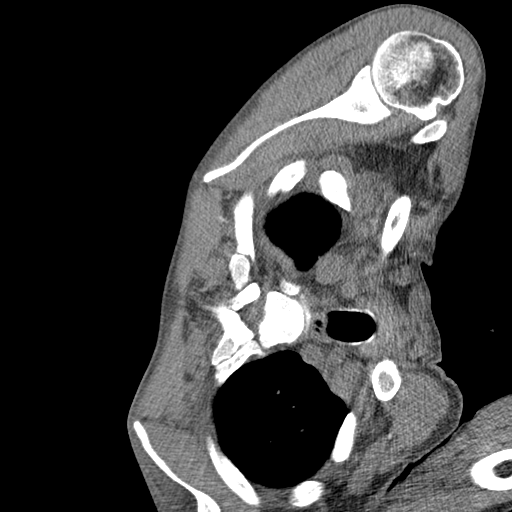
[im 71/86  lung]
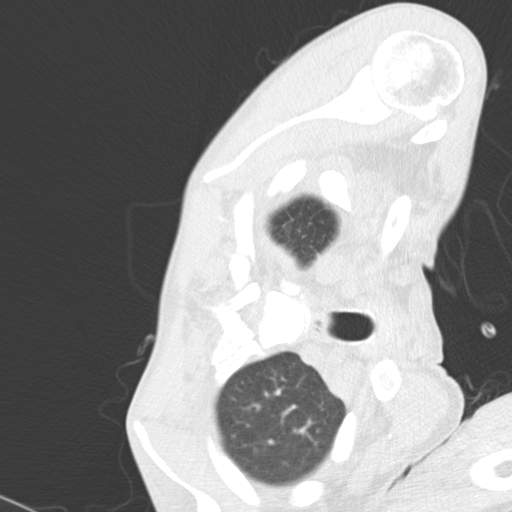
[im 78/86  soft-tissue]
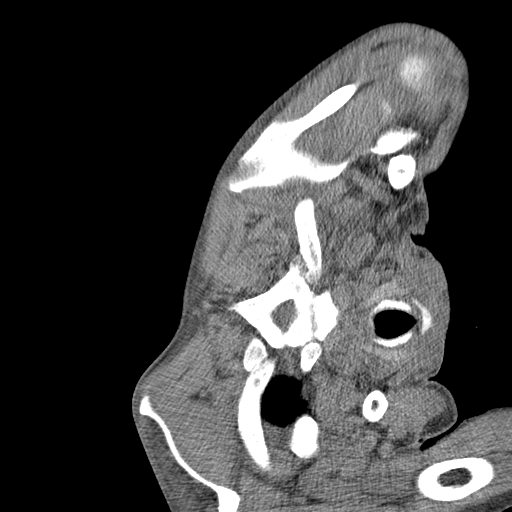
[im 78/86  lung]
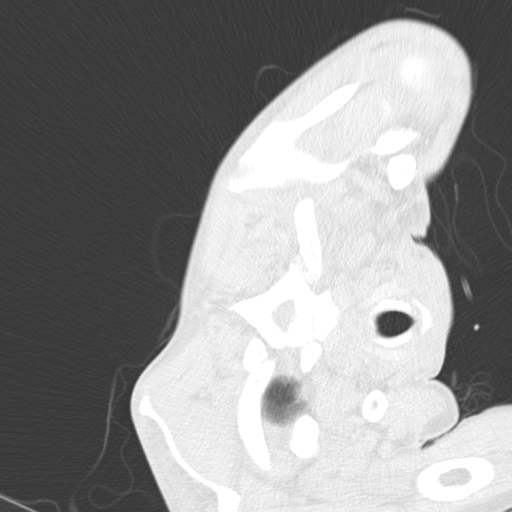

[11 of 32 positions shown; findings below may reference images not displayed]

He has been referred for chest tube placement in attempt to collapse
the potential space.

EXAM:
CT GUIDED RIGHT CHEST TUBE PLACEMENT

MEDICATIONS:
The patient is currently admitted to the hospital and receiving
intravenous antibiotics. The antibiotics were administered within an
appropriate time frame prior to the initiation of the procedure.

ANESTHESIA/SEDATION:
1.5 mg IV Versed 75 mcg IV Fentanyl

Moderate Sedation Time:  29 minutes

The patient was continuously monitored during the procedure by the
interventional radiology nurse under my direct supervision.

COMPLICATIONS:
None
PROCEDURE:
Patient was positioned left decubitus. Scout CT of the chest
performed. The lower right chest was prepped with chlorhexidine in a
sterile fashion, and a sterile drape was applied covering the
operative field. A sterile gown and sterile gloves were used for the
procedure. Local anesthesia was provided with 1% Lidocaine.

Using CT guidance, 18 gauge trocar needle was advanced into the
complex fluid collection of the lower right chest. Modified
Seldinger technique was used to place a 10 French drain into the
pleural space.

Catheter was attached to water seal aspiration chamber and sutured
in position.

Final images were stored and sent to PACs.

Catheter was sutured in position.

Patient tolerated procedure well and remained hemodynamically stable
throughout.

No complications were encountered and no significant blood loss.
FINDINGS: CT images of the chest demonstrate pleural thickening on the right
with complex fluid collection and multiple loculations.

Ten French drain terminates within the costophrenic sulcus within
the fluid collection.
IMPRESSION: Status post CT-guided right thoracostomy tube placement.

## 2020-12-17 IMAGING — DX DG CHEST 1V PORT
1 series · 1 of 1 positions shown · non-contrast
Comparison: 07/25/2018

CLINICAL DATA: Chest tube with chest pain

EXAM:
PORTABLE CHEST 1 VIEW

[chest ap]
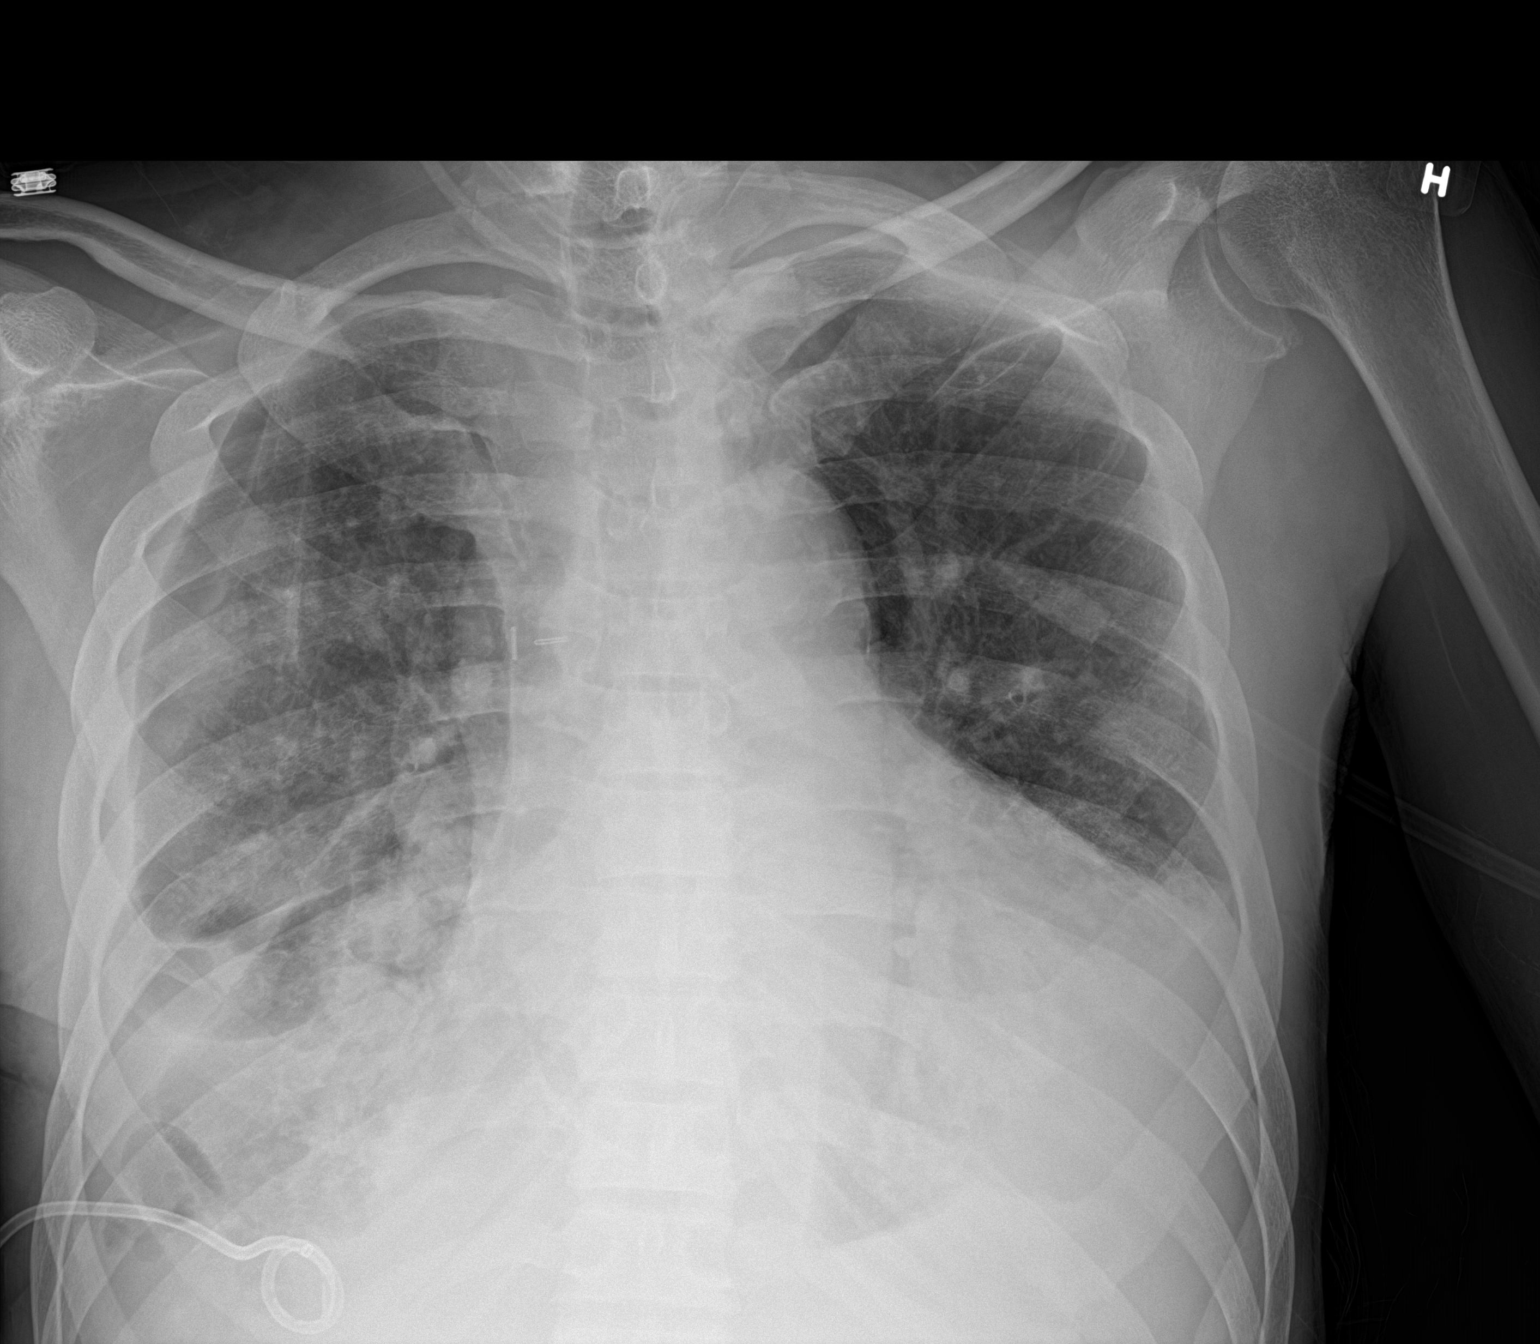

[1 of 1 positions shown; findings below may reference images not displayed]

FINDINGS: Right base chest tube. There is small residual right
hydropneumothorax. Cardiomegaly with pericardial effusion by CT.
Small left pleural effusion. Vascular congestion.
IMPRESSION: 1. Stable basal hydropneumothorax on the right.
2. Cardiomegaly and pericardial effusion.

## 2020-12-18 IMAGING — DX DG CHEST 1V
1 series · 1 of 1 positions shown · non-contrast
Comparison: Radiograph July 29, 2018.

CLINICAL DATA: Hydropneumothorax.

EXAM:
CHEST  1 VIEW

[chest]
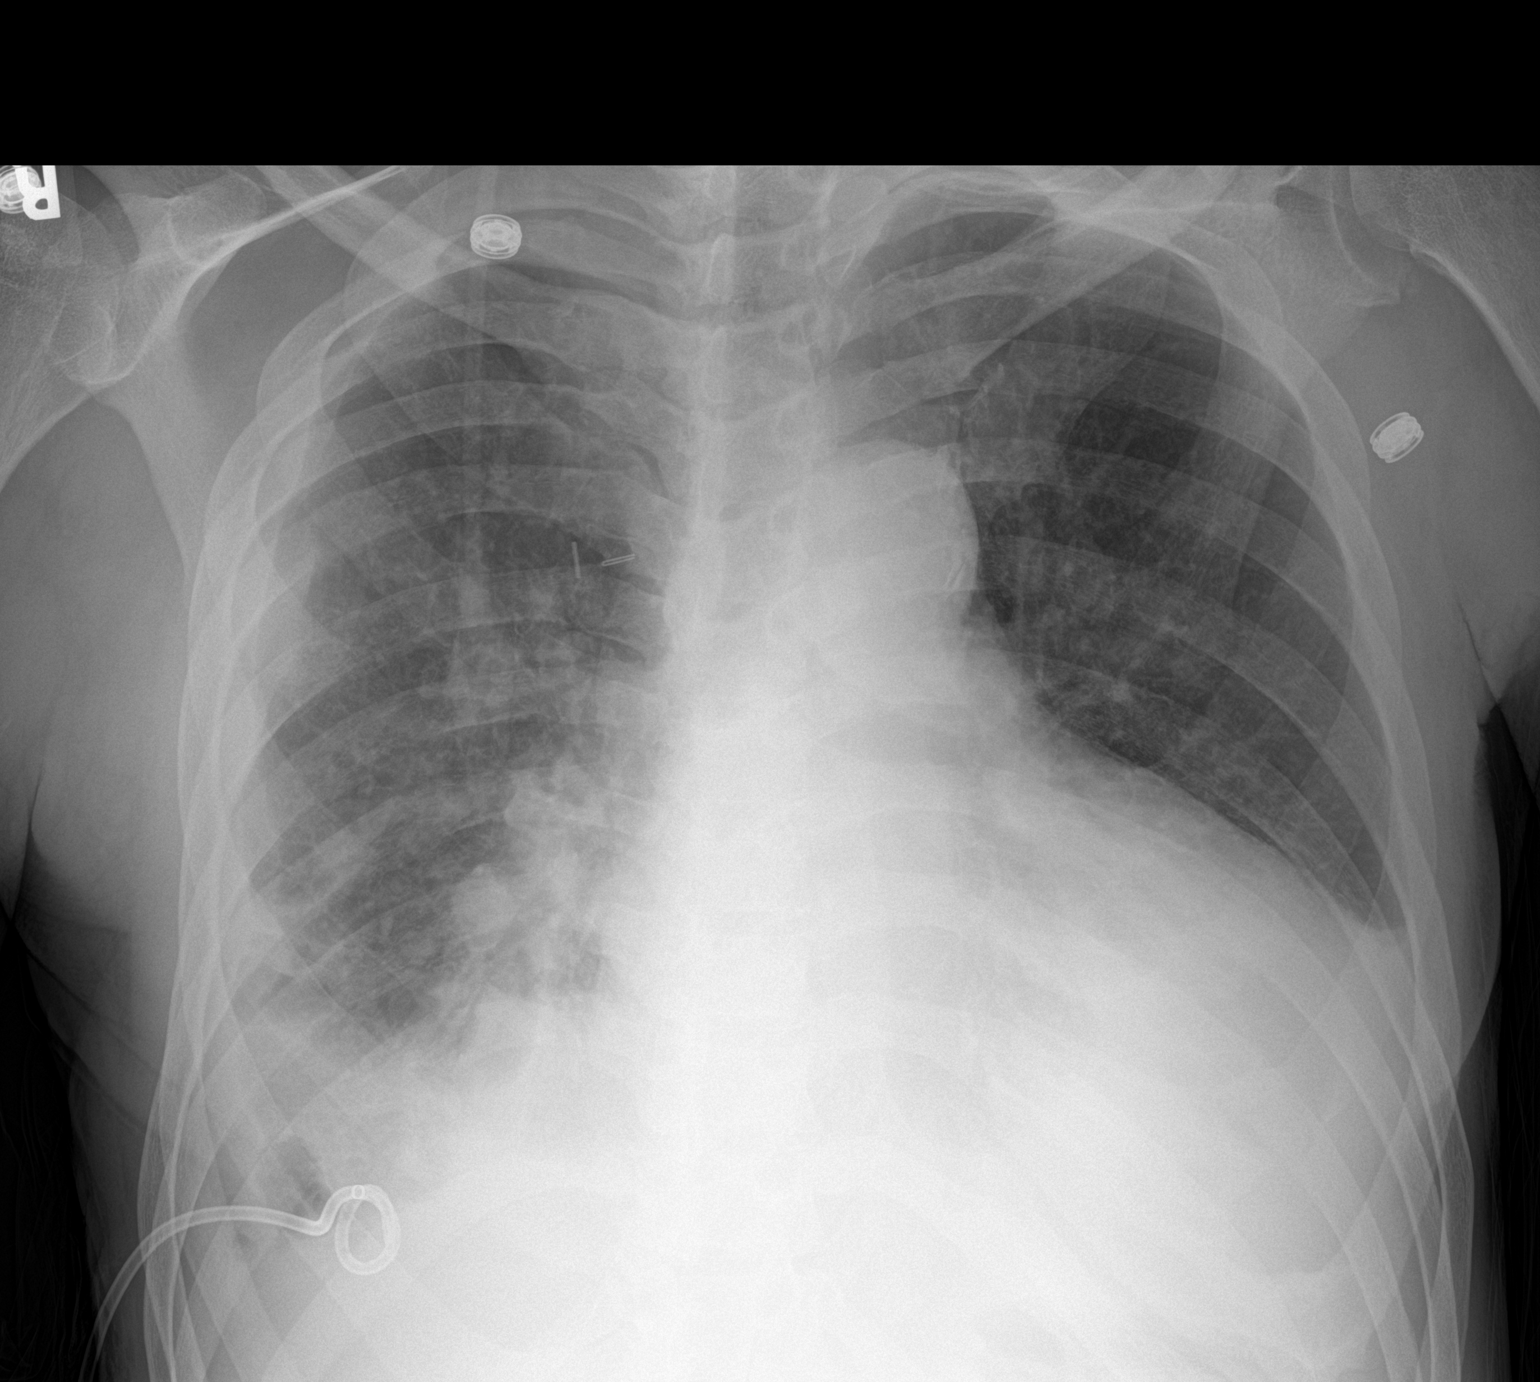

[1 of 1 positions shown; findings below may reference images not displayed]

FINDINGS: Stable cardiomegaly. No pneumothorax is noted. Stable left basilar
opacity is noted concerning for atelectasis or infiltrate with
associated pleural effusion. Stable position of right-sided pigtail
drainage catheter in right lung base. Stable right basilar
hydropneumothorax is noted. Bony thorax is unremarkable.
IMPRESSION: Stable cardiomegaly. Stable position of right-sided pigtail drainage
catheter in right lung base with stable right basilar
hydropneumothorax. Stable left basilar opacity as described above.

## 2020-12-20 IMAGING — DX DG CHEST 1V PORT
1 series · 1 of 1 positions shown · non-contrast
Comparison: The chest x-ray 07/30/2018

CLINICAL DATA: Hydropneumothorax.

EXAM:
PORTABLE CHEST 1 VIEW

[chest ap]
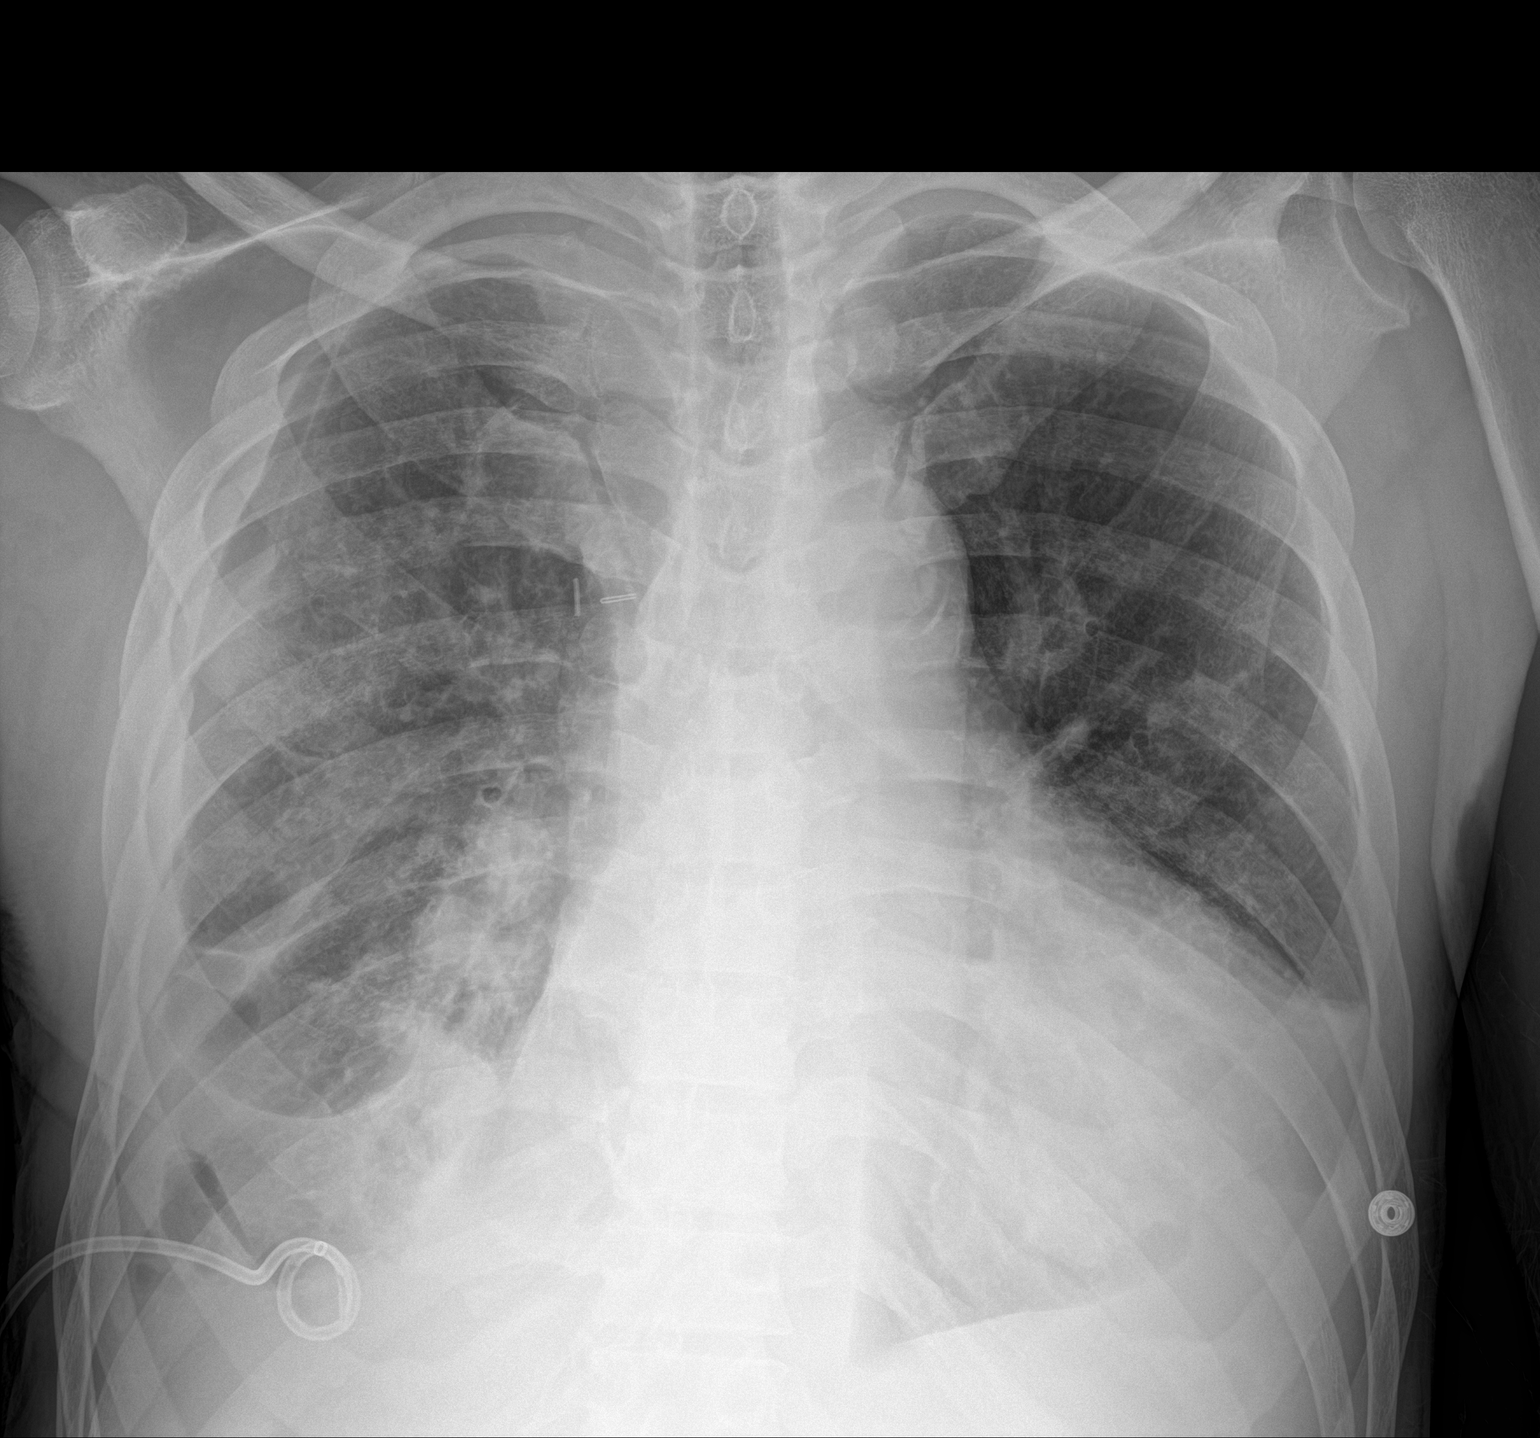

[1 of 1 positions shown; findings below may reference images not displayed]

FINDINGS: Heart is enlarged. Bilateral pleural effusions similar the prior
study. Asymmetric edema is present on the right. Right pneumothorax
has increased slightly. Locule of air is present at the base. A
pigtail catheter is in place. Left upper lobe is clear.
IMPRESSION: 1. Increased near component of right-sided hydropneumothorax.
2. Right base pigtail catheter stable.
3. Cardiomegaly with asymmetric right-sided edema.

## 2020-12-22 IMAGING — DX DG CHEST 1V PORT
1 series · 1 of 1 positions shown · non-contrast
Comparison: 1 day prior

CLINICAL DATA: Chest pain after chest tube removal.

EXAM:
PORTABLE CHEST 1 VIEW

[chest ap]
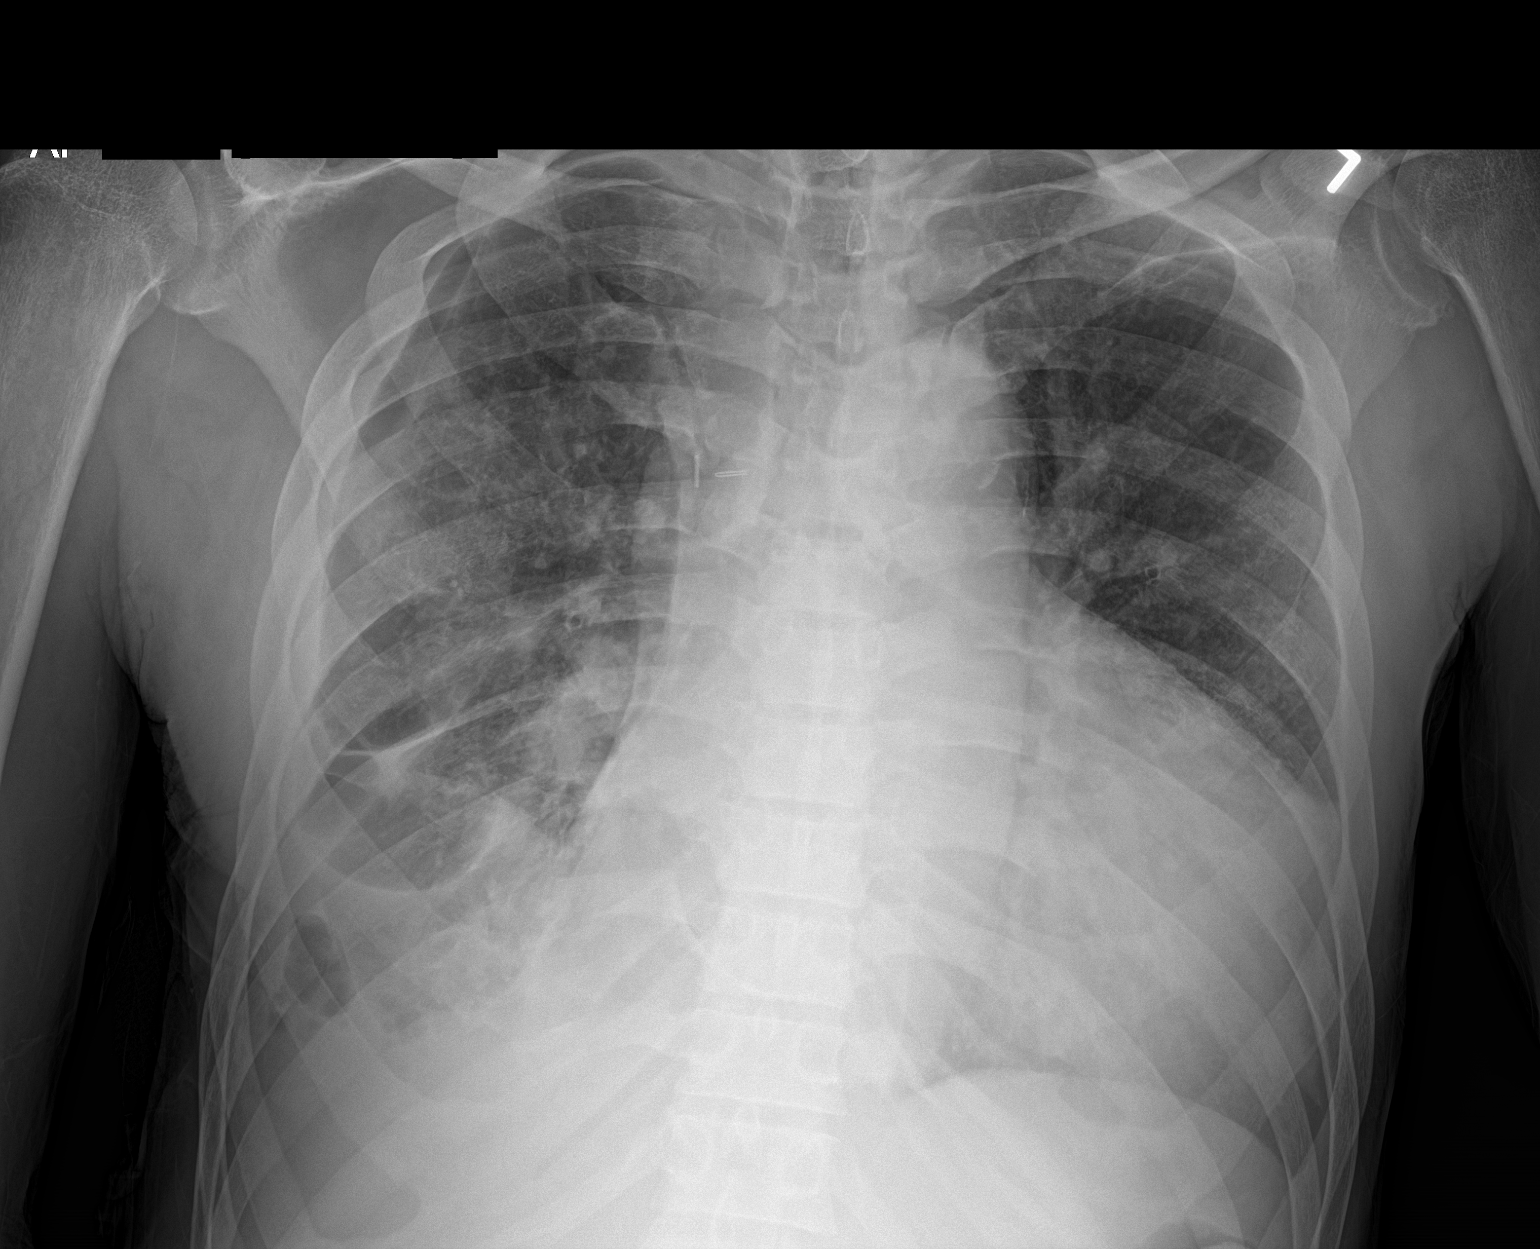

[1 of 1 positions shown; findings below may reference images not displayed]

FINDINGS: Midline trachea. Moderate cardiomegaly. Removal of small bore
right-sided pleural catheter. The pleural air component of the
right-sided hydropneumothorax is minimally enlarged. On the order of
5%. No left-sided effusion. Mild interstitial edema. Worsening right
base airspace disease.
IMPRESSION: Removal of right-sided chest tube with minimal increase in pleural
air component of the right-sided hydropneumothorax.

Worsening right base airspace disease.

Cardiomegaly and mild interstitial edema.

## 2020-12-23 IMAGING — CR DG CHEST 2V
2 series · 2 of 2 positions shown · non-contrast
Comparison: 08/03/2018 and earlier.

CLINICAL DATA: Follow-up RIGHT hydropneumothorax.

EXAM:
CHEST - 2 VIEW

[chest lat]
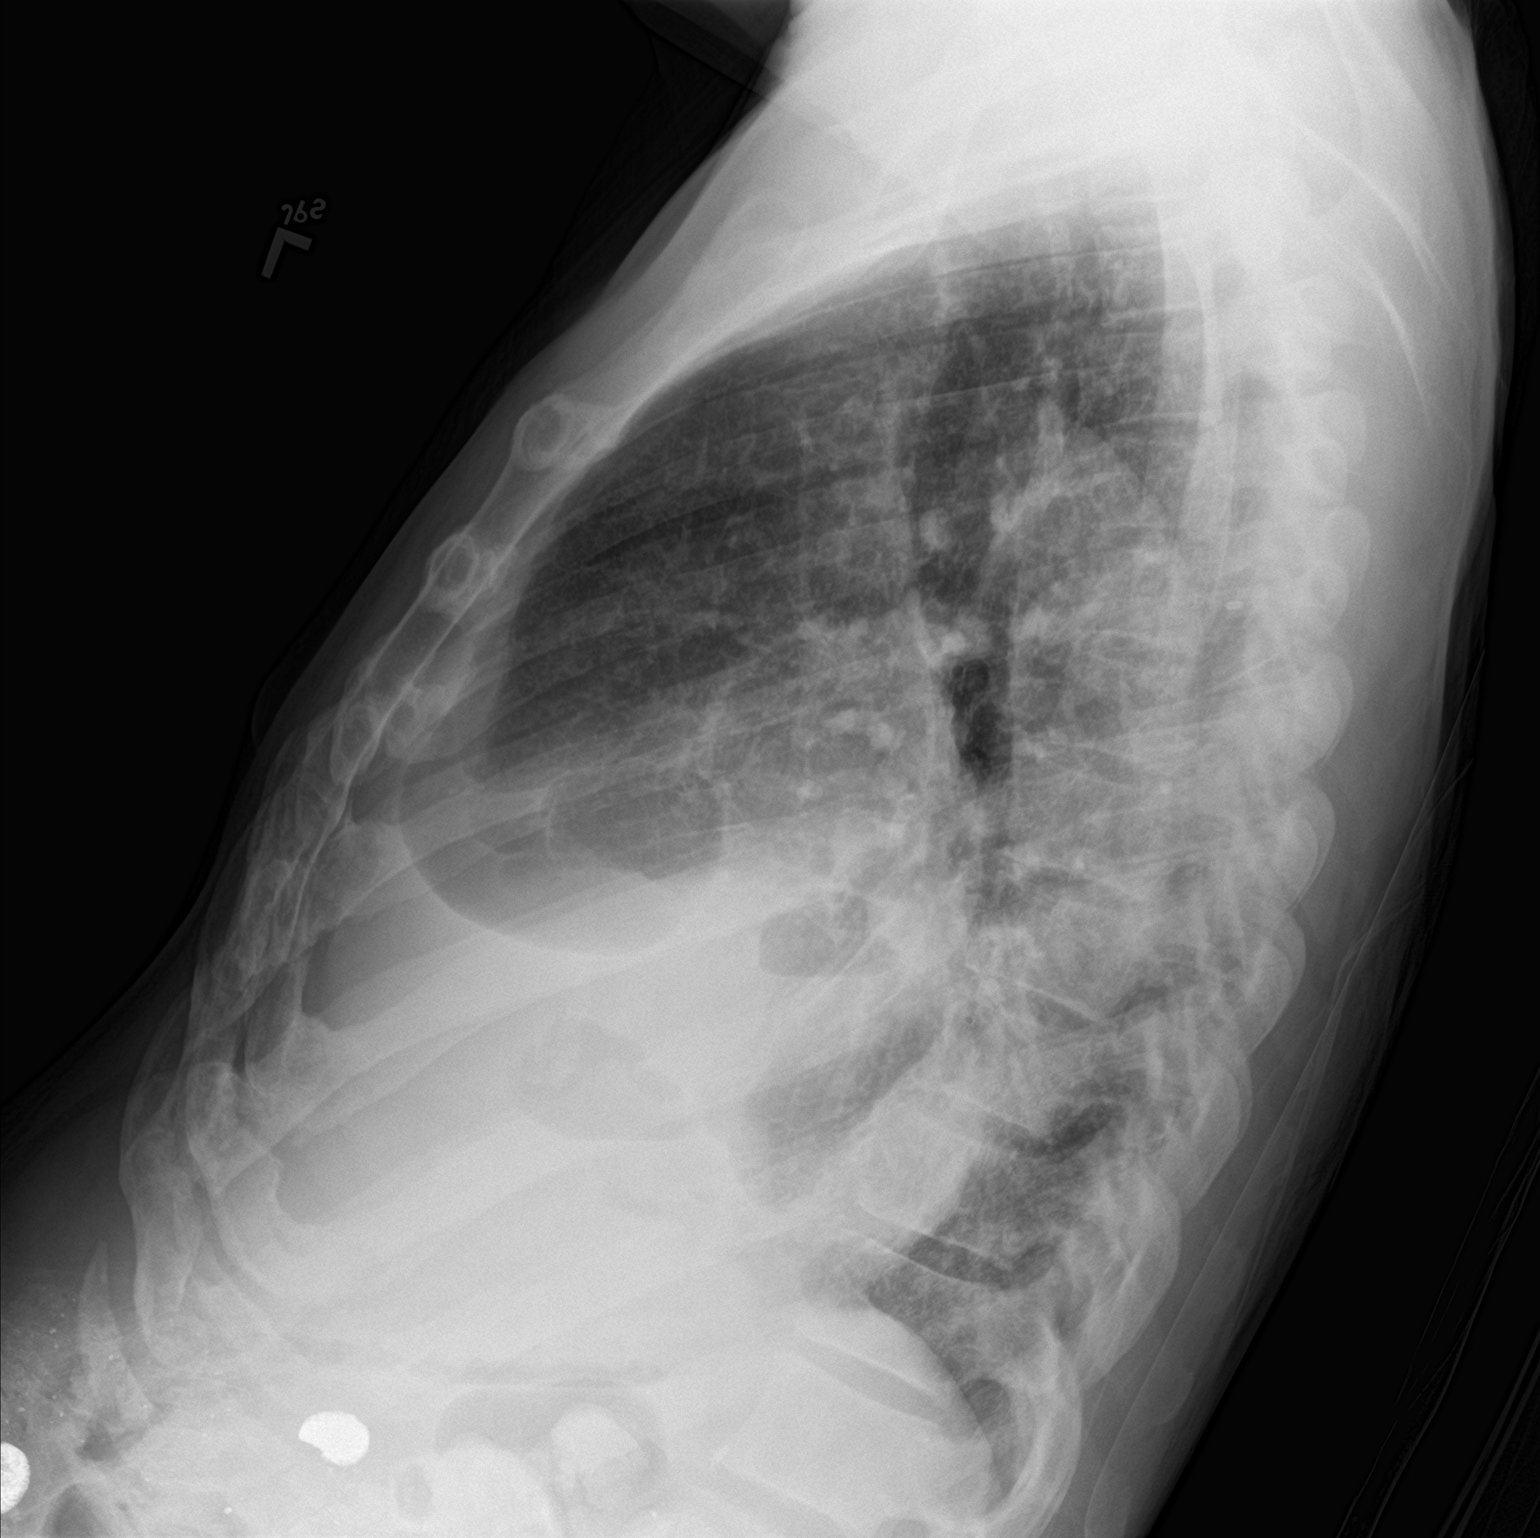

[chest ap]
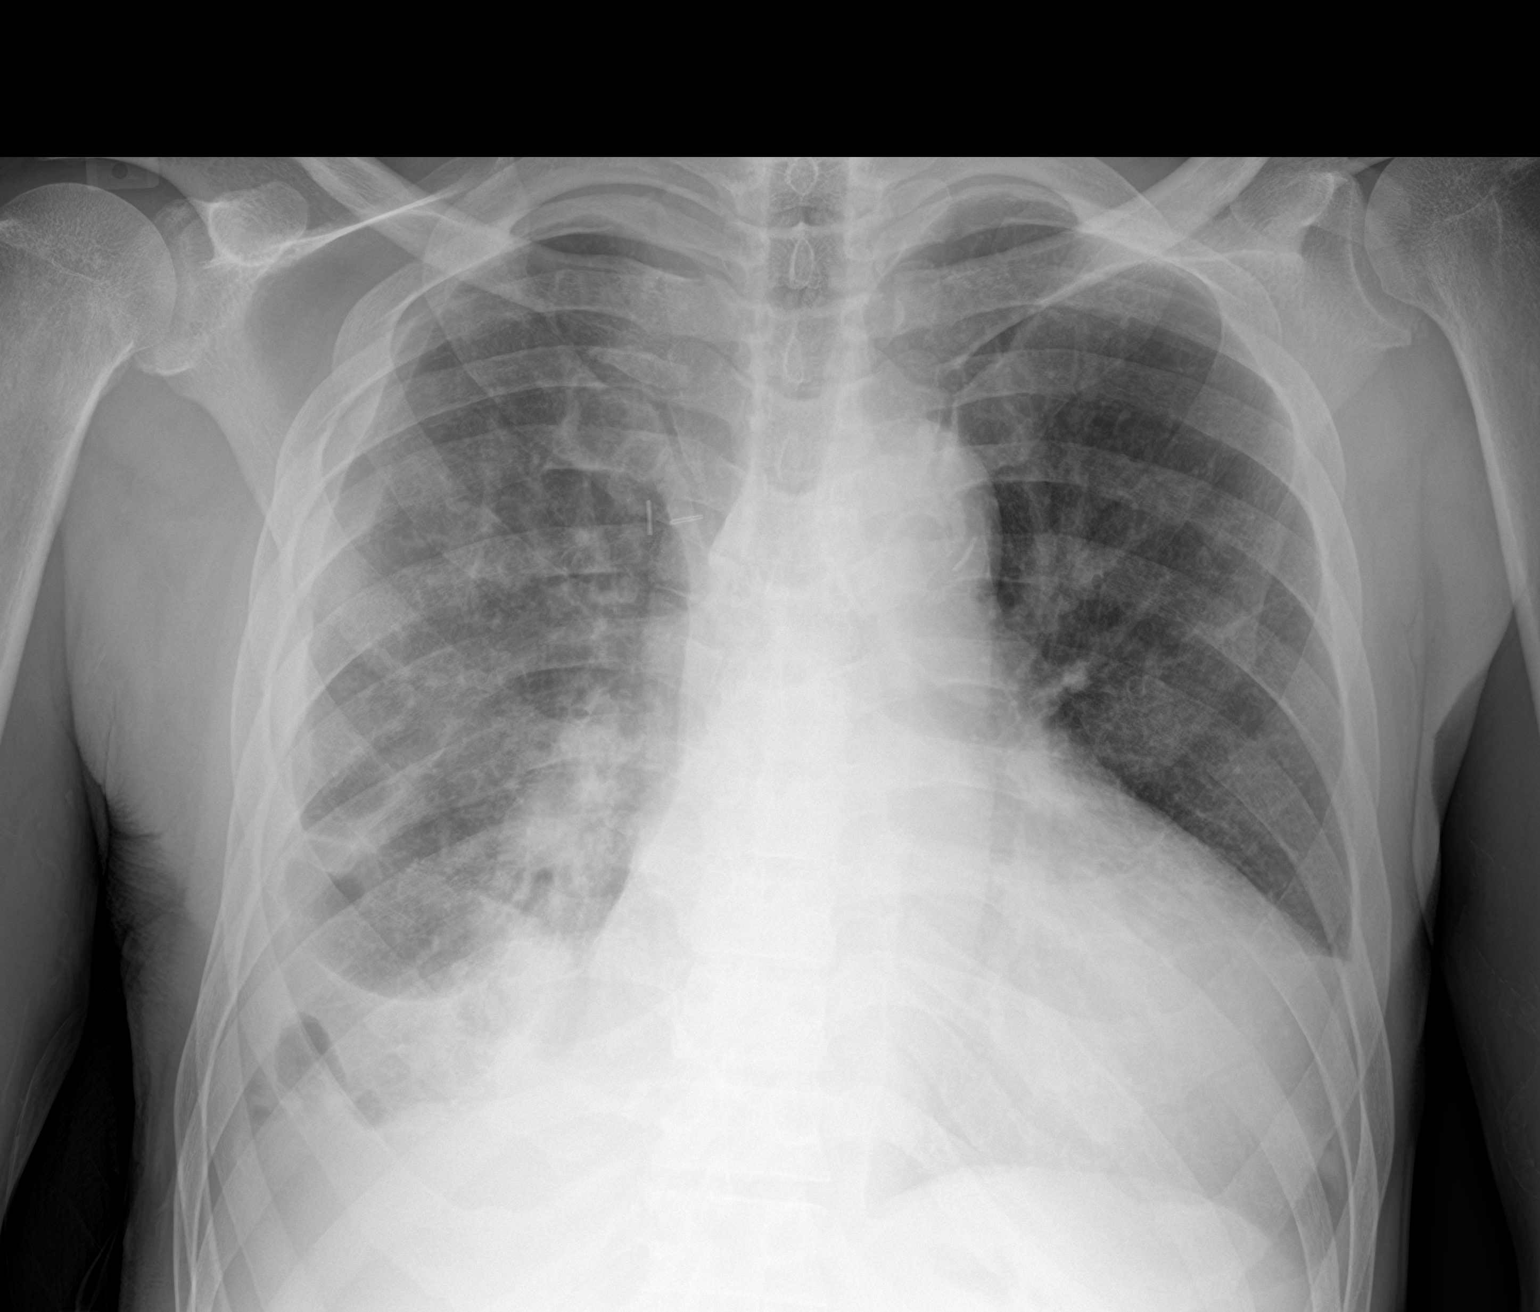

[2 of 2 positions shown; findings below may reference images not displayed]

FINDINGS: Cardiac silhouette markedly enlarged, unchanged. RIGHT apicolateral
pneumothorax and likely loculated RIGHT basilar hydropneumothorax,
unchanged since yesterday. Pulmonary venous hypertension without
overt edema, unchanged since yesterday. Consolidation in the RIGHT
LOWER LOBE, unchanged. No new pulmonary parenchymal abnormalities.
IMPRESSION: 1. Stable RIGHT apicolateral pneumothorax and likely loculated RIGHT
basilar hydropneumothorax compared to yesterday.
2. Stable atelectasis and/or pneumonia in the RIGHT LOWER LOBE.
3. Stable marked cardiomegaly without pulmonary edema.
4. No new abnormalities.

## 2021-01-26 IMAGING — DX DG CHEST 2V
2 series · 2 of 2 positions shown · non-contrast
Comparison: Chest CT 08/28/2018 and earlier.

CLINICAL DATA: 52-year-old male with history of bronchopleural
fistula. Status post chest tube last month.

EXAM:
CHEST - 2 VIEW

[x chest ap]
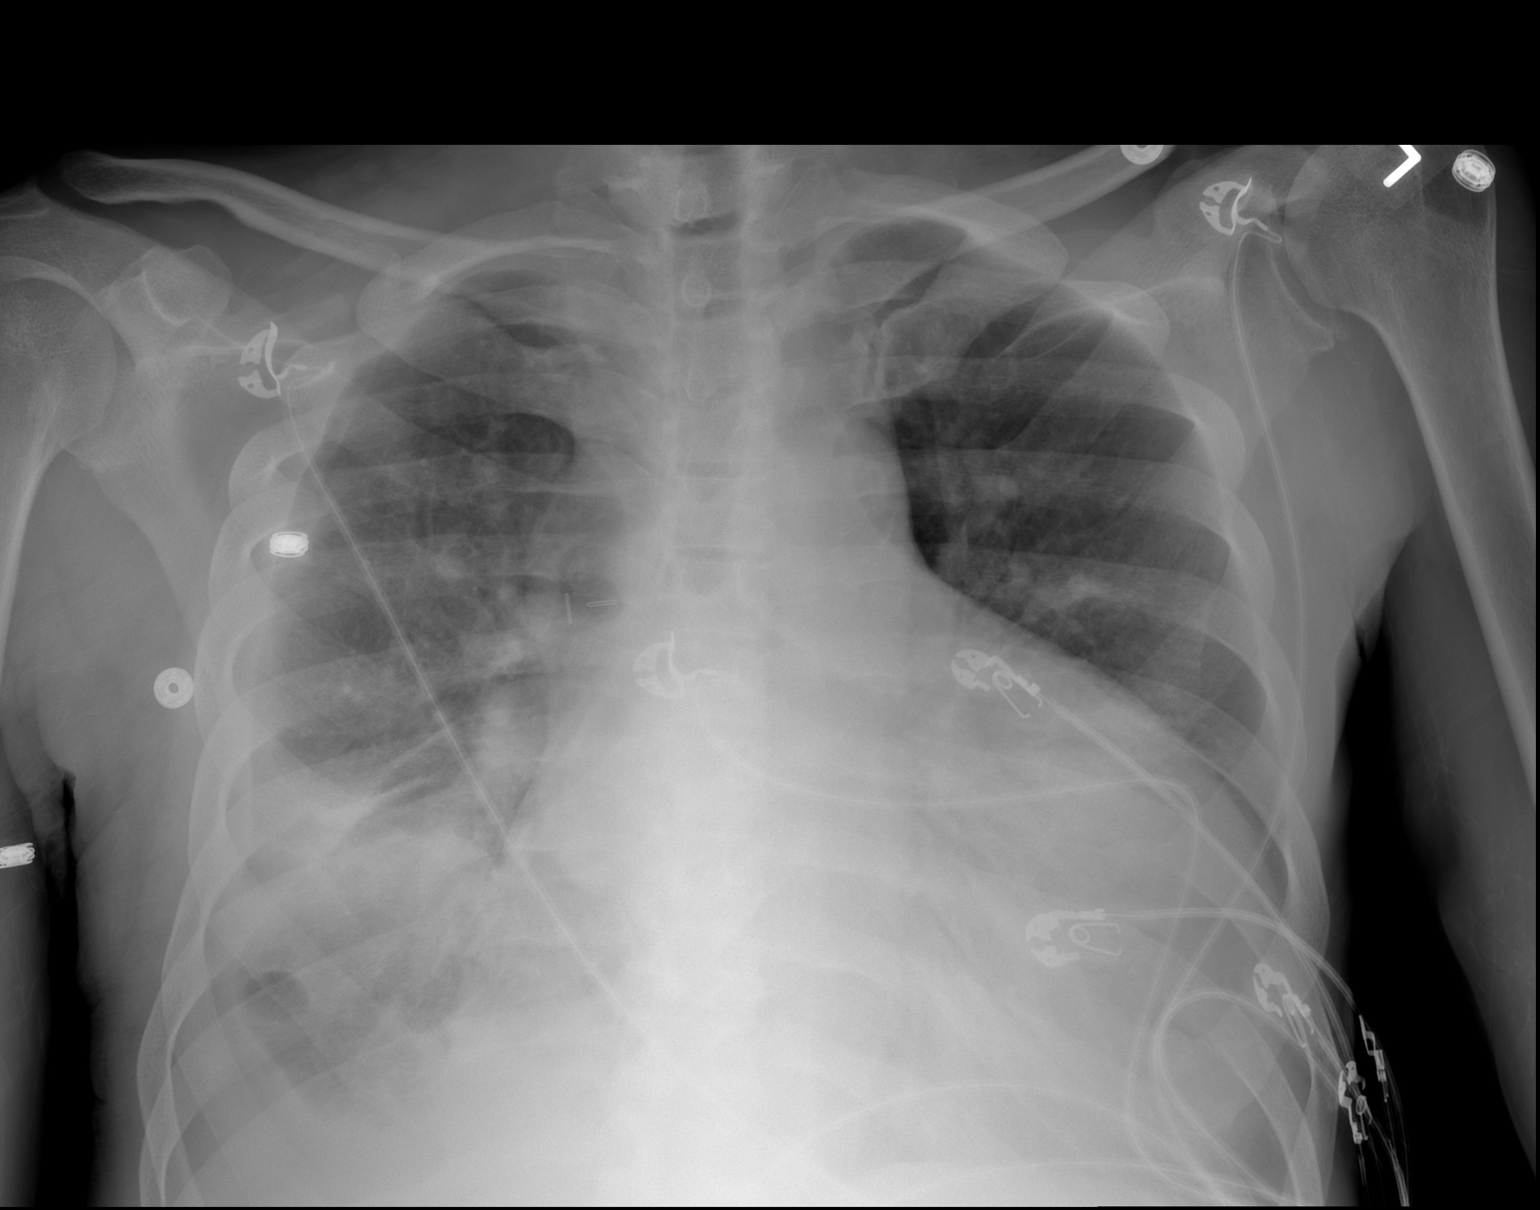

[w chest lat]
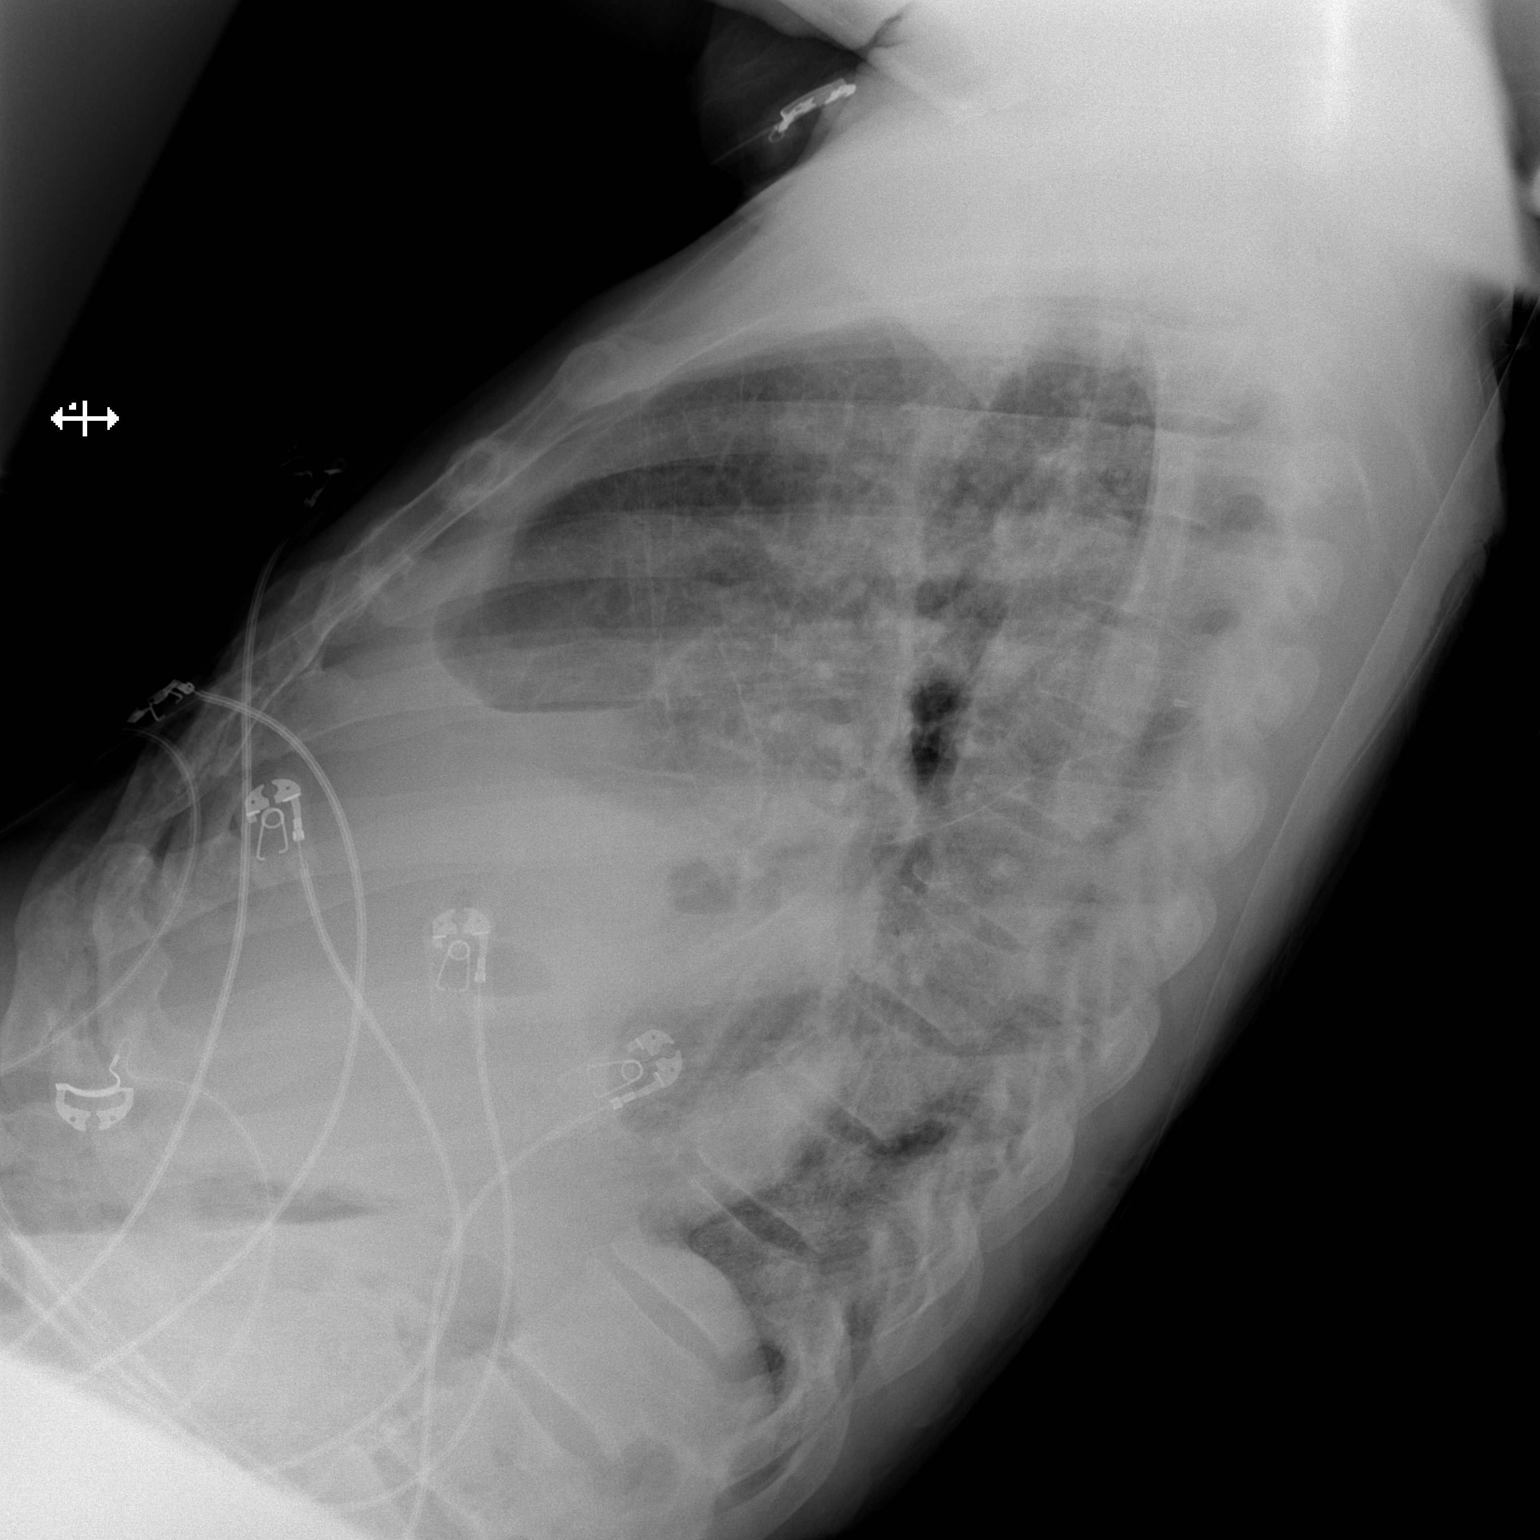

[2 of 2 positions shown; findings below may reference images not displayed]

FINDINGS: Upright AP and lateral views of the chest. Stable cardiomegaly and
mediastinal contours. A pericardial effusion contributed to the
cardiac enlargement on the recent CT. Visualized tracheal air column
is within normal limits. Left lung base atelectasis suspected and
not significantly changed. The left upper lung remains clear.

Mixed increased density and lucency in the right lower lung, a
especially the middle lobe, corresponds to the complex residual
pleural collection seen by CT earlier this month with superimposed
lower lung opacity. Small volume loculated fluid or pleural
thickening suspected. No apical pneumothorax identified today.
IMPRESSION: 1. Abnormal right lung appears stable from the CT on 08/28/2018.
Loculated right lung base hydropneumothorax with superimposed
nonspecific lower lung opacity.
2. Cardiomegaly appears stable, with a pericardial effusion present
on the recent CT.
3. No new cardiopulmonary abnormality identified.

## 2021-03-25 IMAGING — CR DG ABDOMEN ACUTE W/ 1V CHEST
3 series · 3 of 3 positions shown · non-contrast
Comparison: Two-view chest x-ray 09/17/18. CT of the chest
08/28/2018.

CLINICAL DATA: Nausea and vomiting. Increased fatigue. Abdominal
pain and distension. End-stage renal disease.

EXAM:
DG ABDOMEN ACUTE W/ 1V CHEST

[abdomen erect]
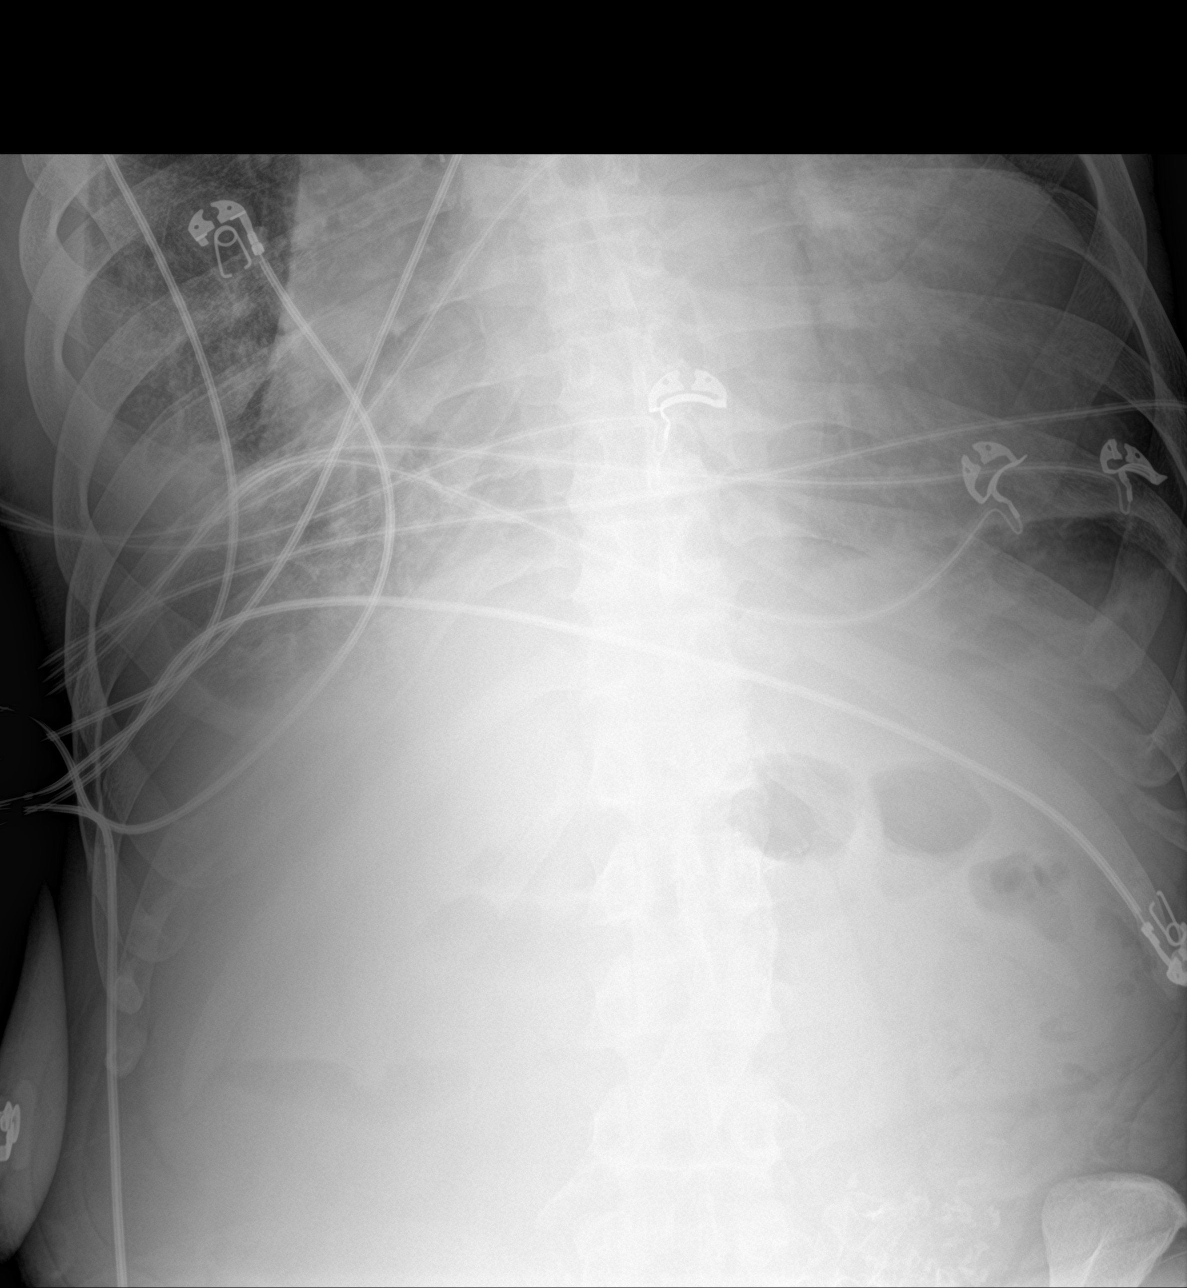

[abdomen supine]
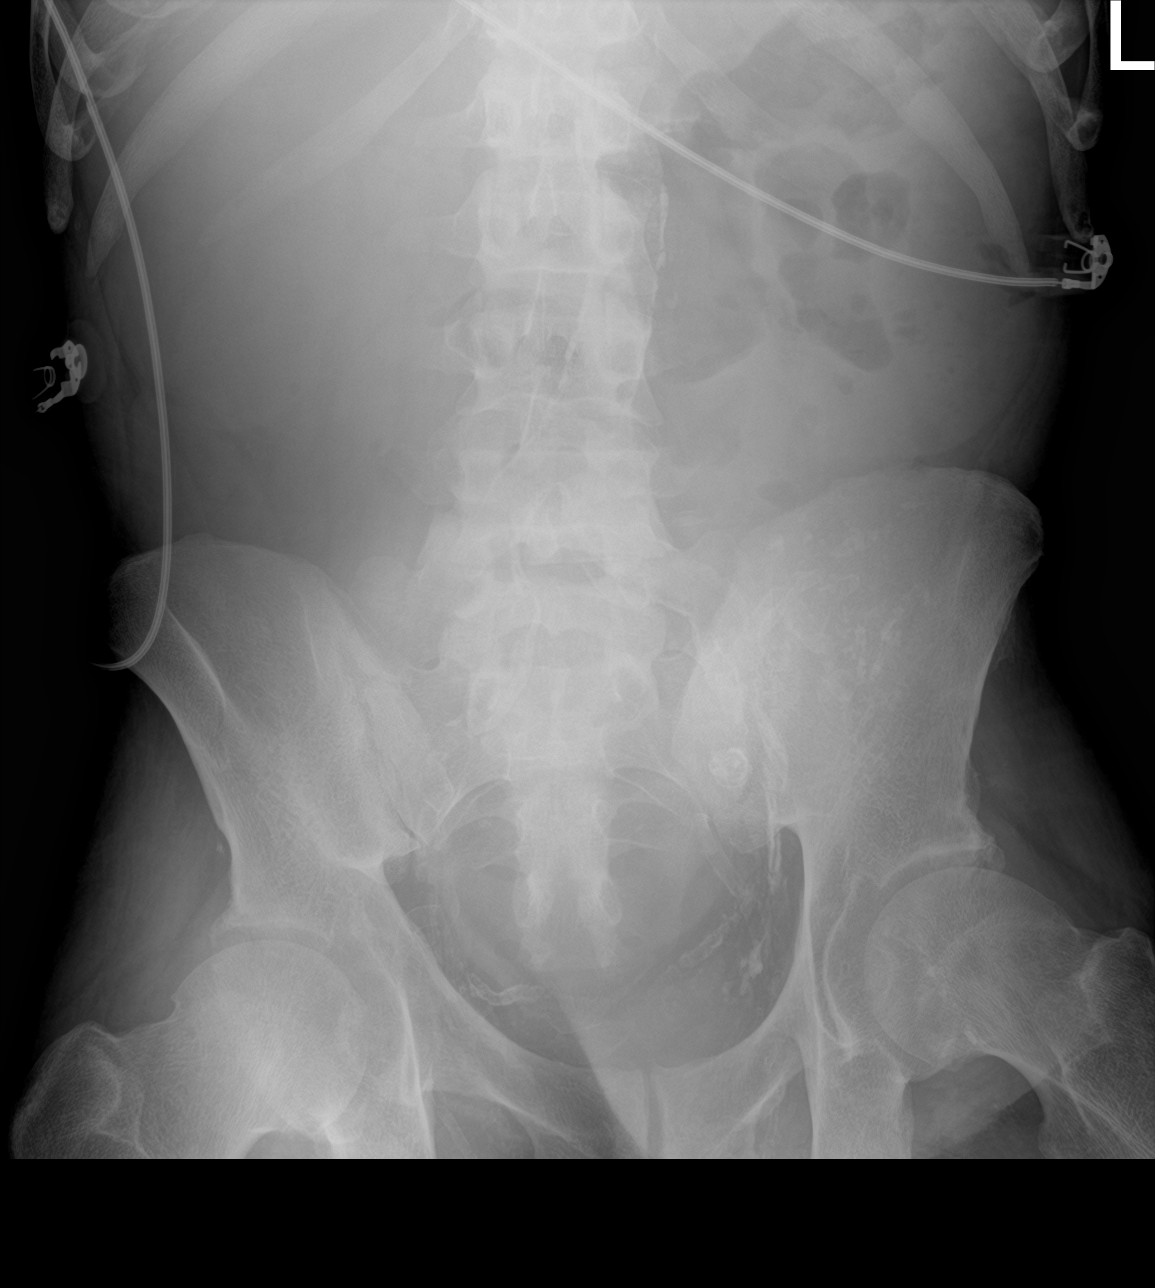

[chest ap]
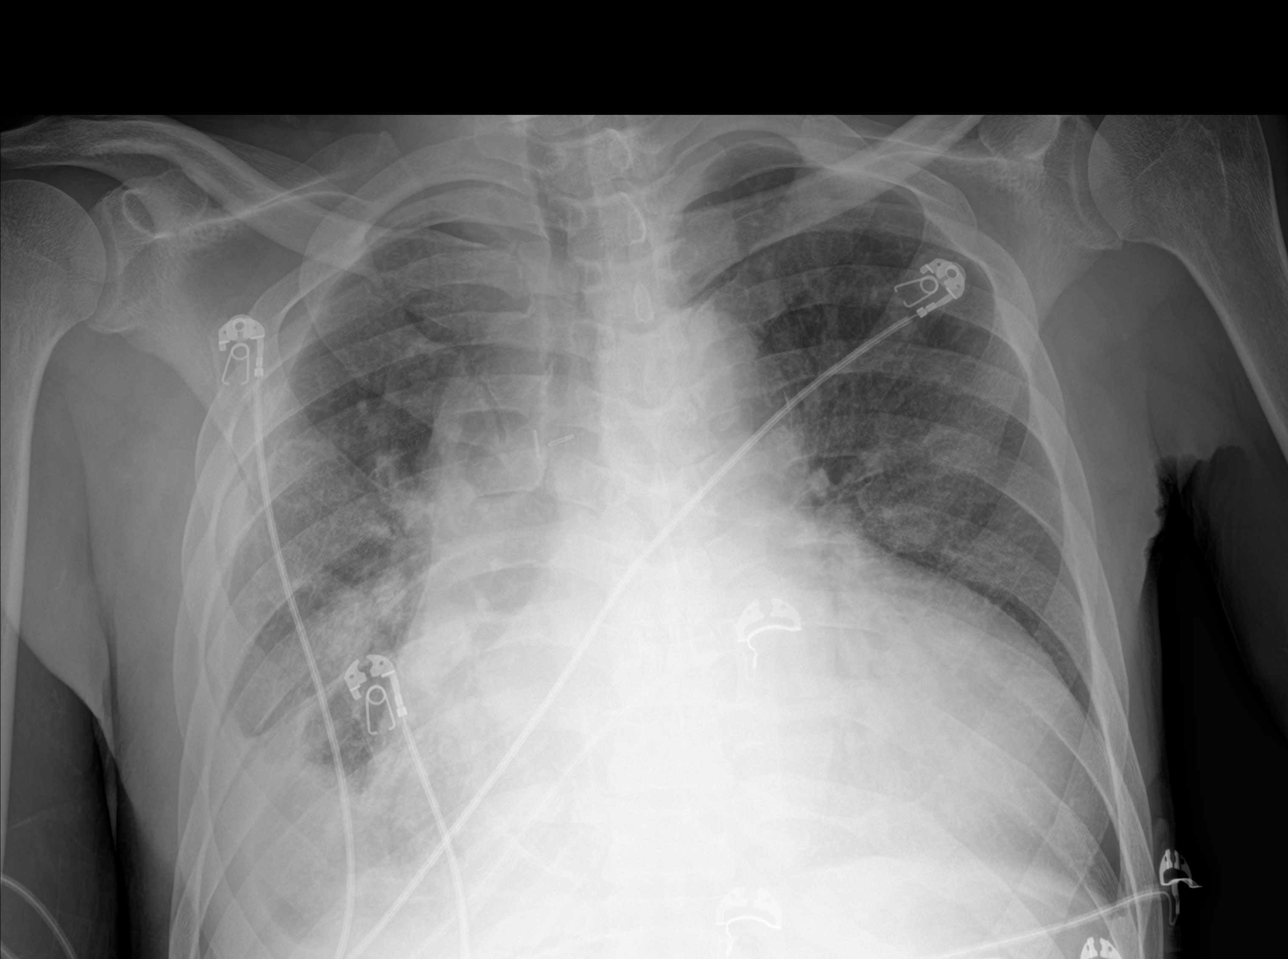

[3 of 3 positions shown; findings below may reference images not displayed]

FINDINGS: Heart is enlarged. Right pleural effusion and right lower lobe
airspace disease is again seen. Lung volumes are low. No significant
left-sided consolidation is present.

Bowel gas pattern is normal. There is no obstruction or free air.
Extensive vascular calcifications are present.
IMPRESSION: 1. Normal bowel gas pattern.
2. Right pleural effusion and basilar airspace disease is again
seen.
3. Cardiomegaly without failure.
4. Aortic atherosclerosis.

## 2021-04-05 IMAGING — DX DG ABDOMEN ACUTE W/ 1V CHEST
3 series · 3 of 3 positions shown · non-contrast
Comparison: November 04, 2018

CLINICAL DATA: Nausea, vomiting, and diarrhea.

EXAM:
DG ABDOMEN ACUTE W/ 1V CHEST

[abdomen supine]
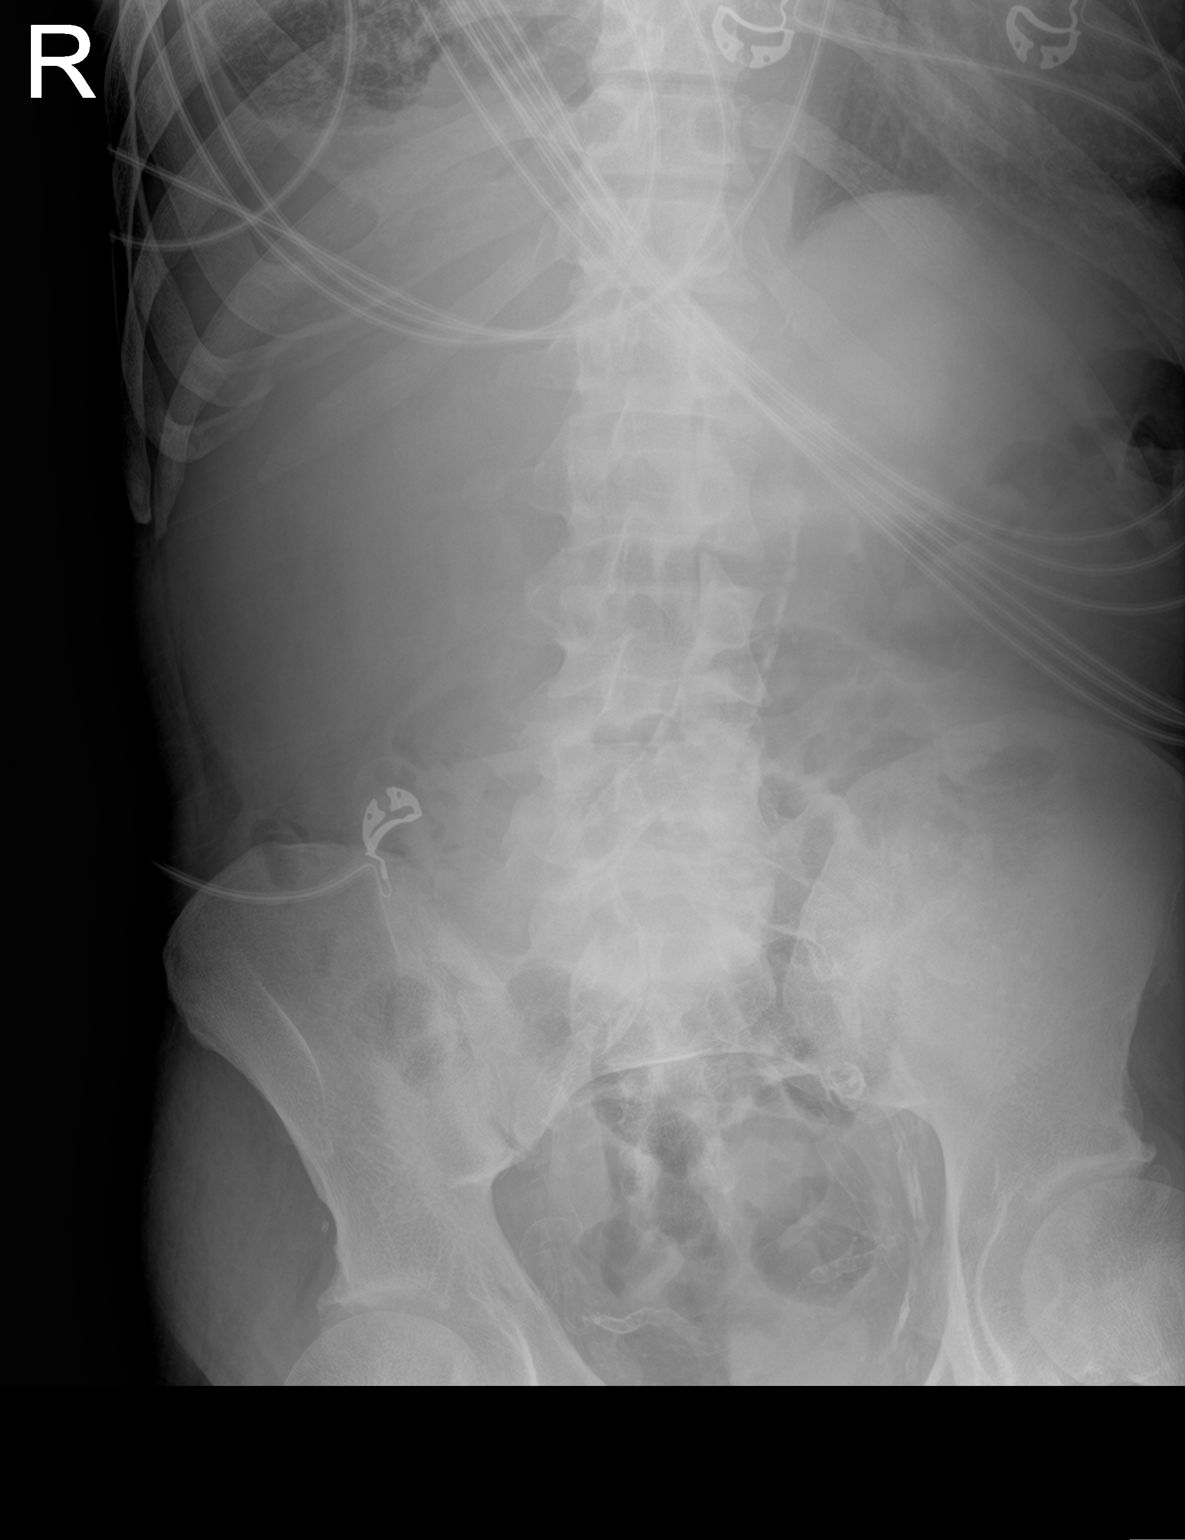

[chest ap]
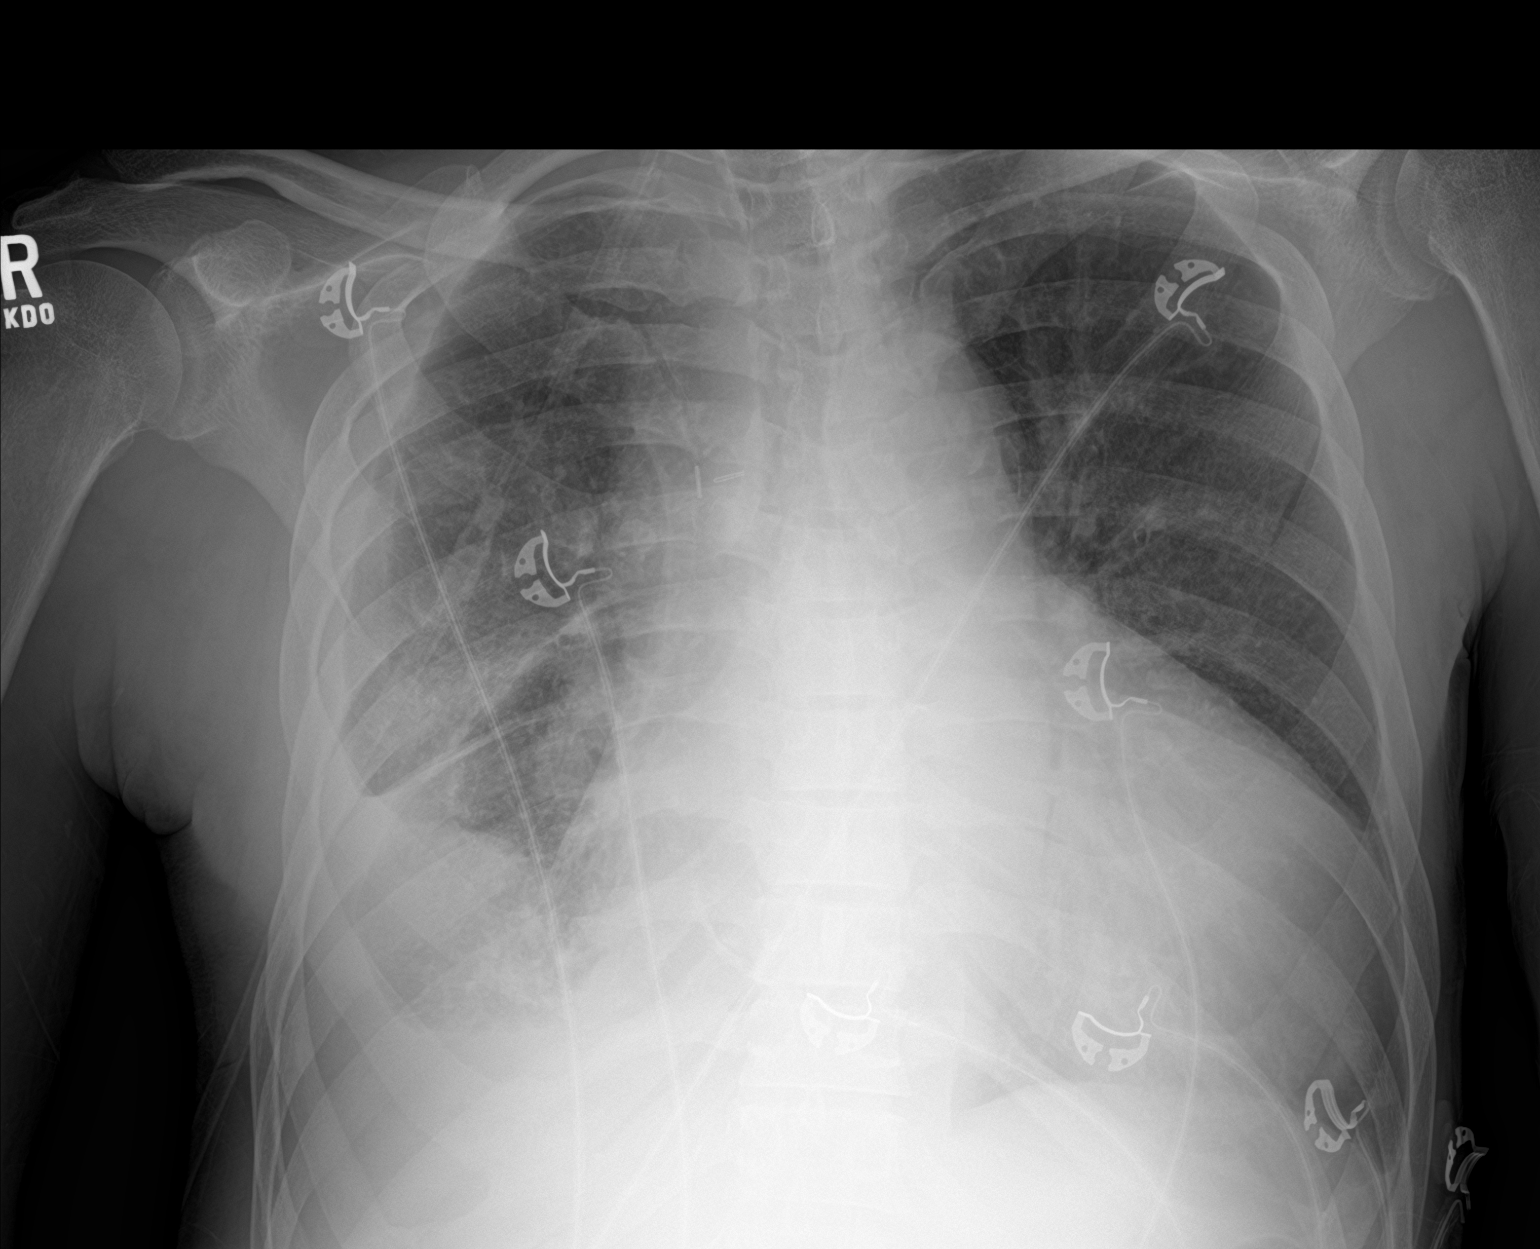

[abdomen erect]
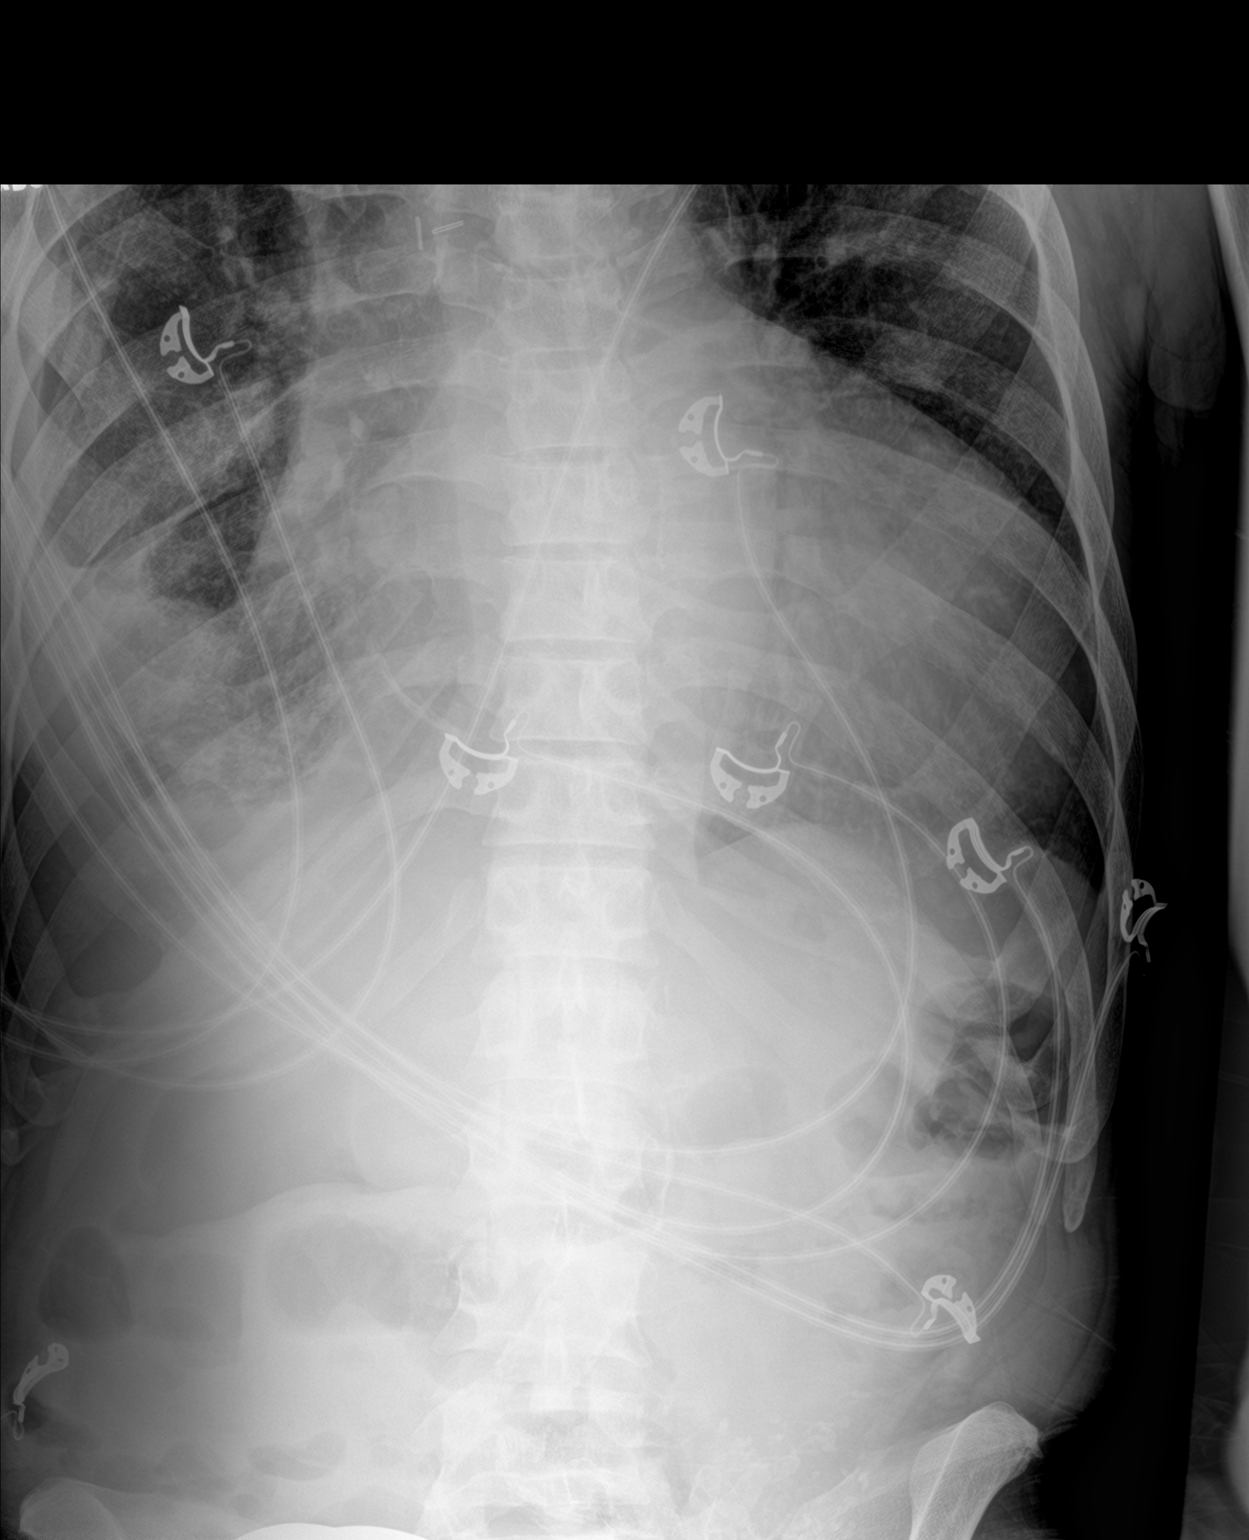

[3 of 3 positions shown; findings below may reference images not displayed]

FINDINGS: A right-sided pleural effusion with underlying opacity remain.
Stable cardiomegaly. Increased interstitial markings throughout the
lungs.

No free air, portal venous gas, or pneumatosis. The bowel gas
pattern is nonobstructive. No other acute abnormalities.
IMPRESSION: 1. Persistent right-sided pleural effusion. Cardiomegaly. Bilateral
interstitial opacities suggest chronic edema.

## 2021-04-26 IMAGING — DX PORTABLE CHEST - 1 VIEW
1 series · 1 of 1 positions shown · non-contrast
Comparison: 11/15/2018

CLINICAL DATA: Weakness, shortness of breath

EXAM:
PORTABLE CHEST 1 VIEW

[chest ap]
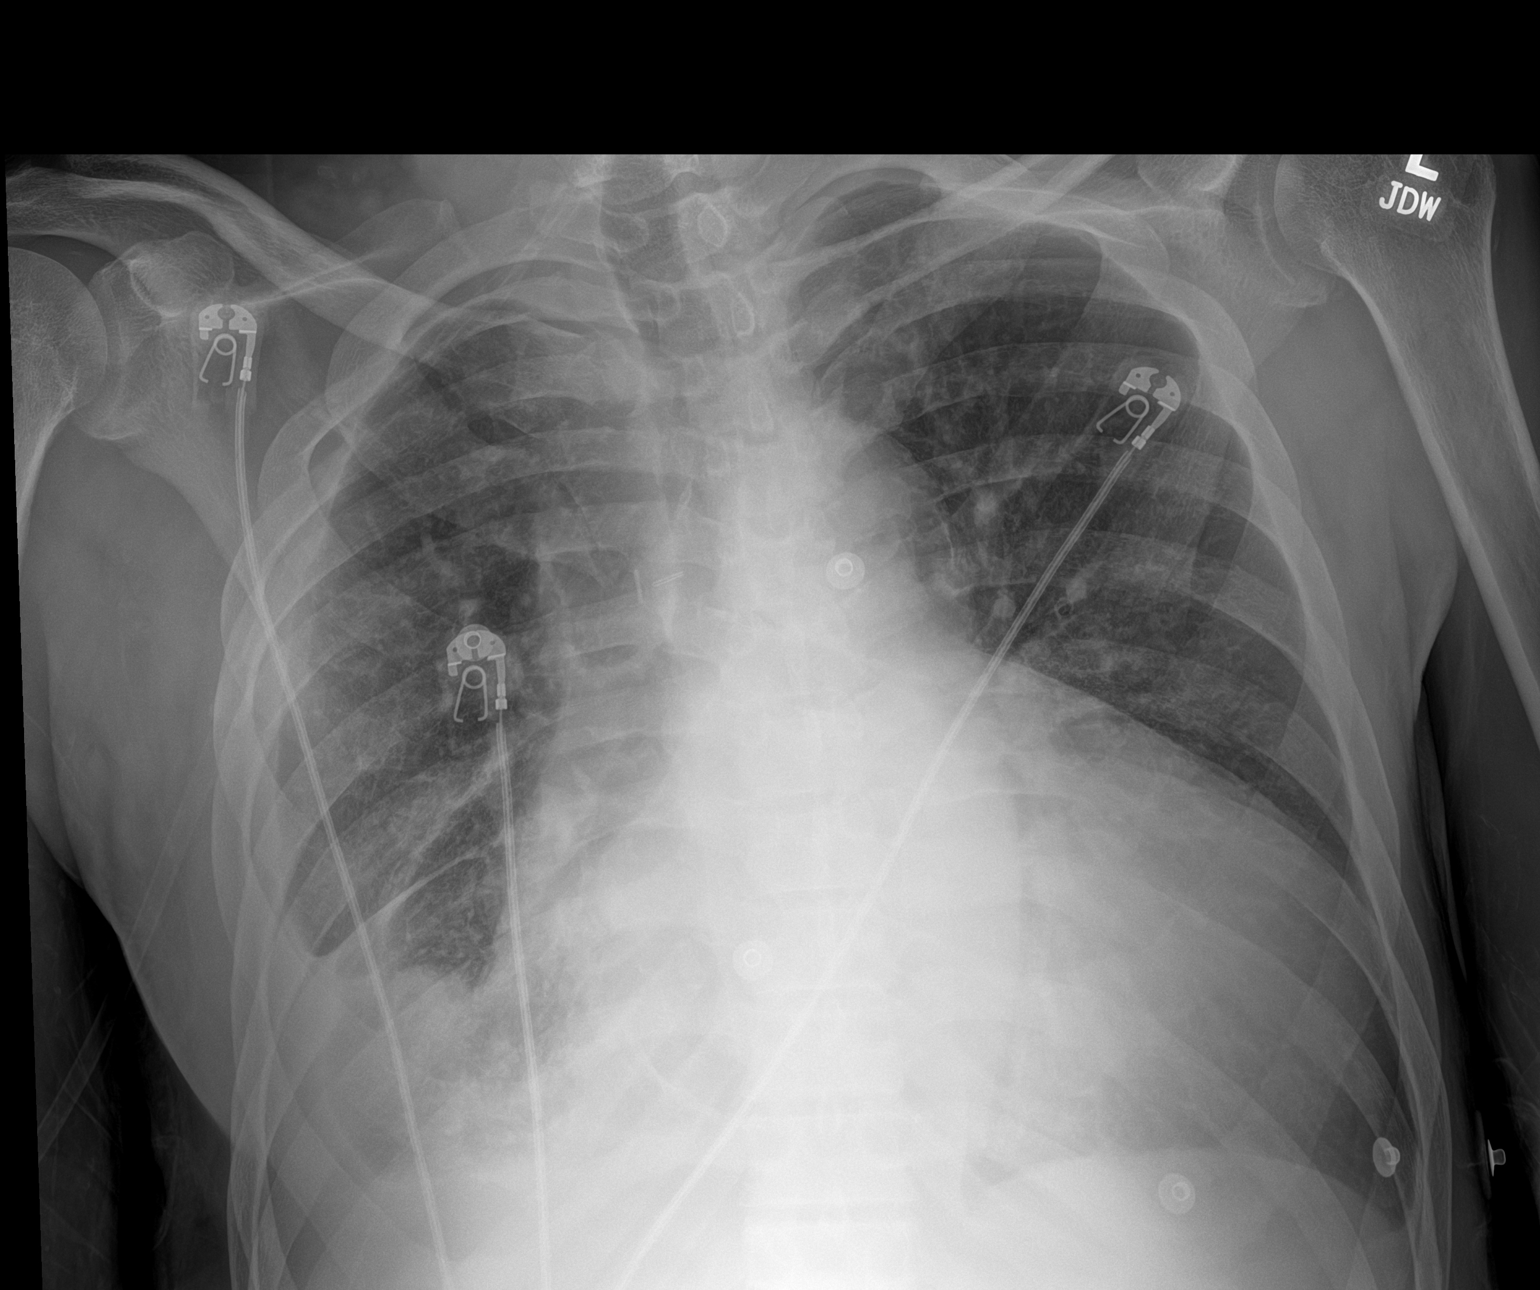

[1 of 1 positions shown; findings below may reference images not displayed]

FINDINGS: Unchanged AP portable examination with gross cardiomegaly, moderate
right pleural effusion with associated atelectasis or consolidation,
and otherwise mild diffuse interstitial pulmonary opacity
throughout. No new airspace opacity.
IMPRESSION: Unchanged AP portable examination with gross cardiomegaly, moderate
right pleural effusion with associated atelectasis or consolidation,
and otherwise mild diffuse interstitial pulmonary opacity
throughout. No new airspace opacity.

## 2021-04-29 IMAGING — DX LEFT ELBOW - COMPLETE 3+ VIEW
4 series · 4 of 4 positions shown · non-contrast
Comparison: None.

CLINICAL DATA: 52-year-old male status post fall.

EXAM:
LEFT ELBOW - COMPLETE 3+ VIEW

[elbow ap]
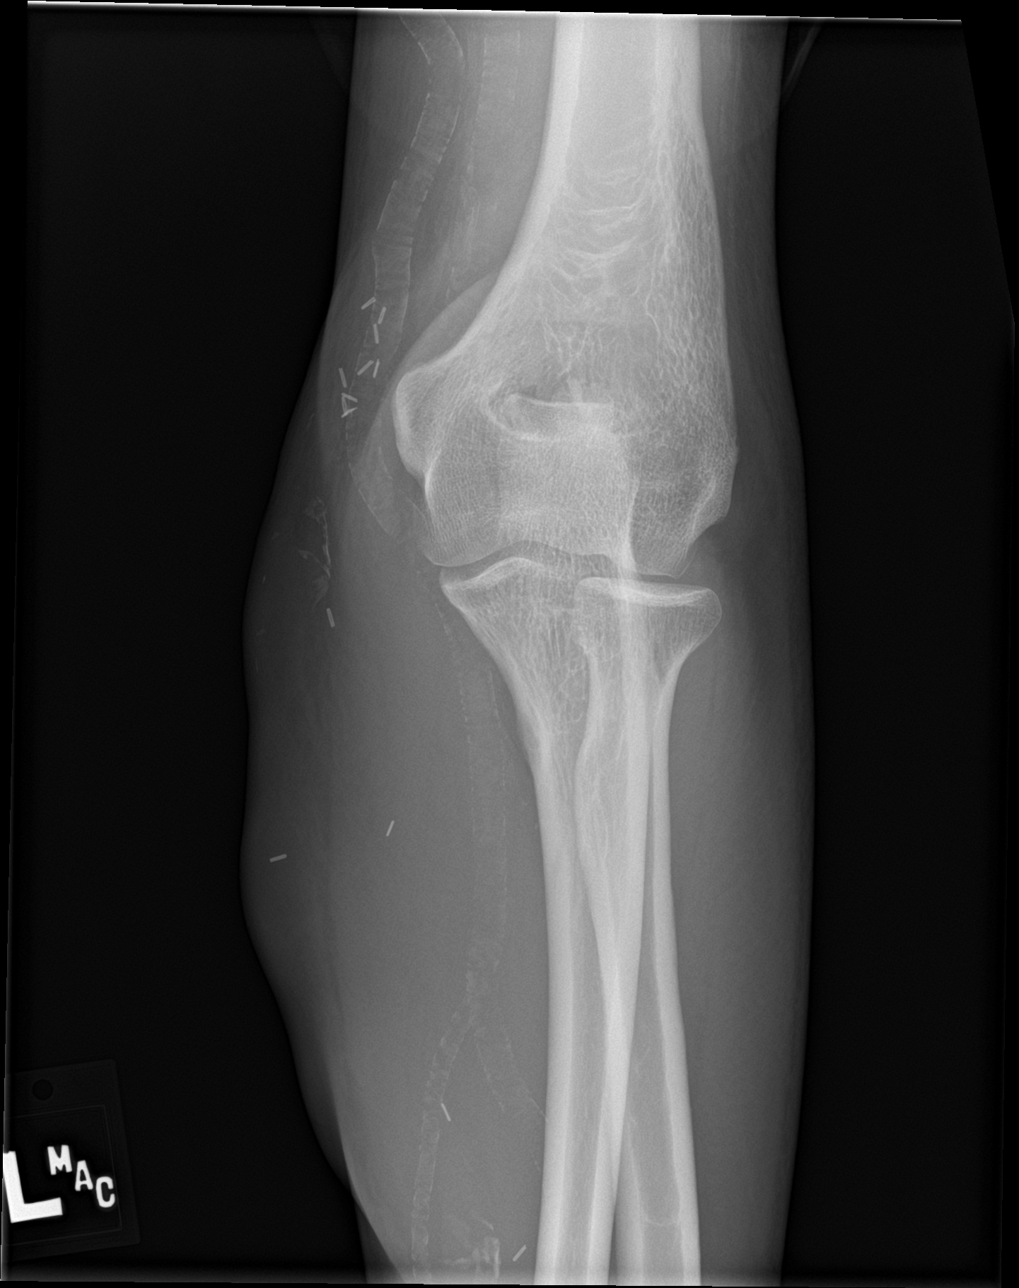

[elbow obl (1 of 2)]
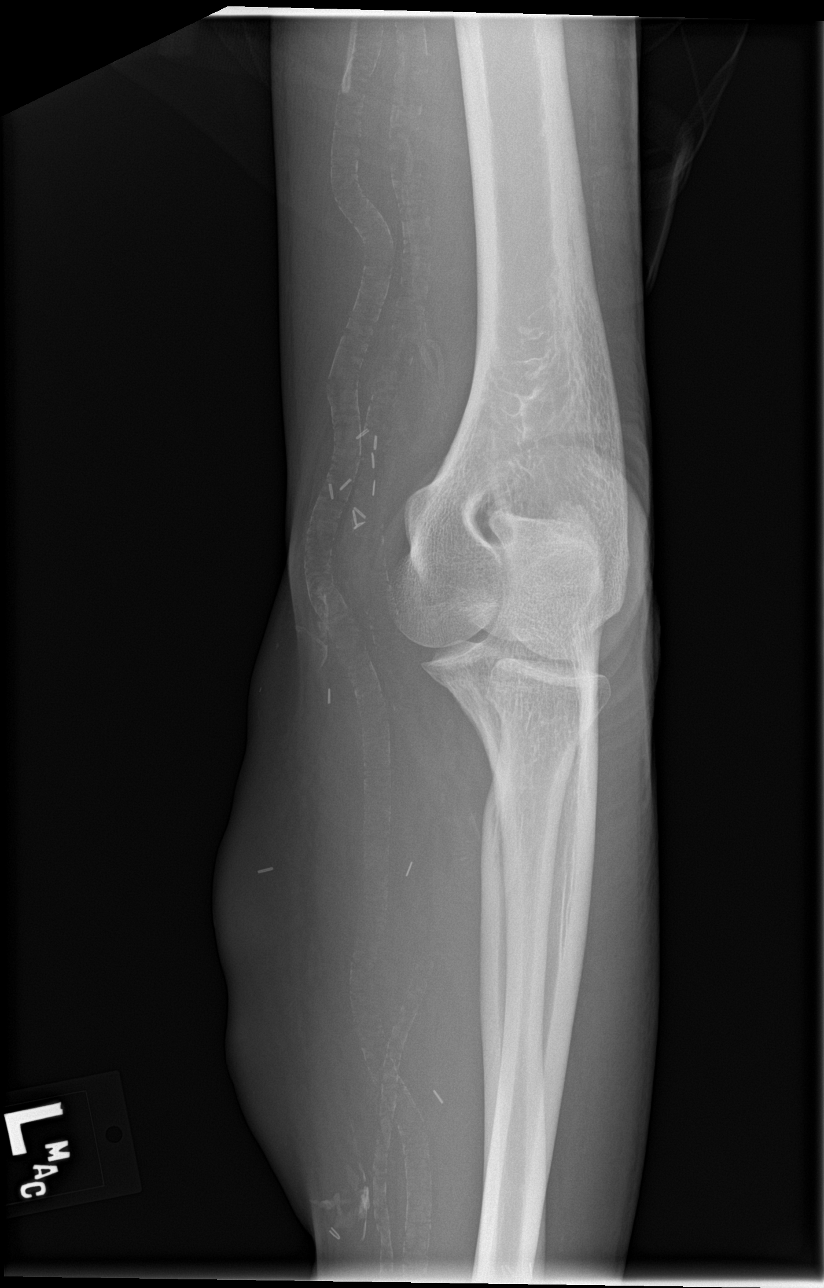

[elbow obl (2 of 2)]
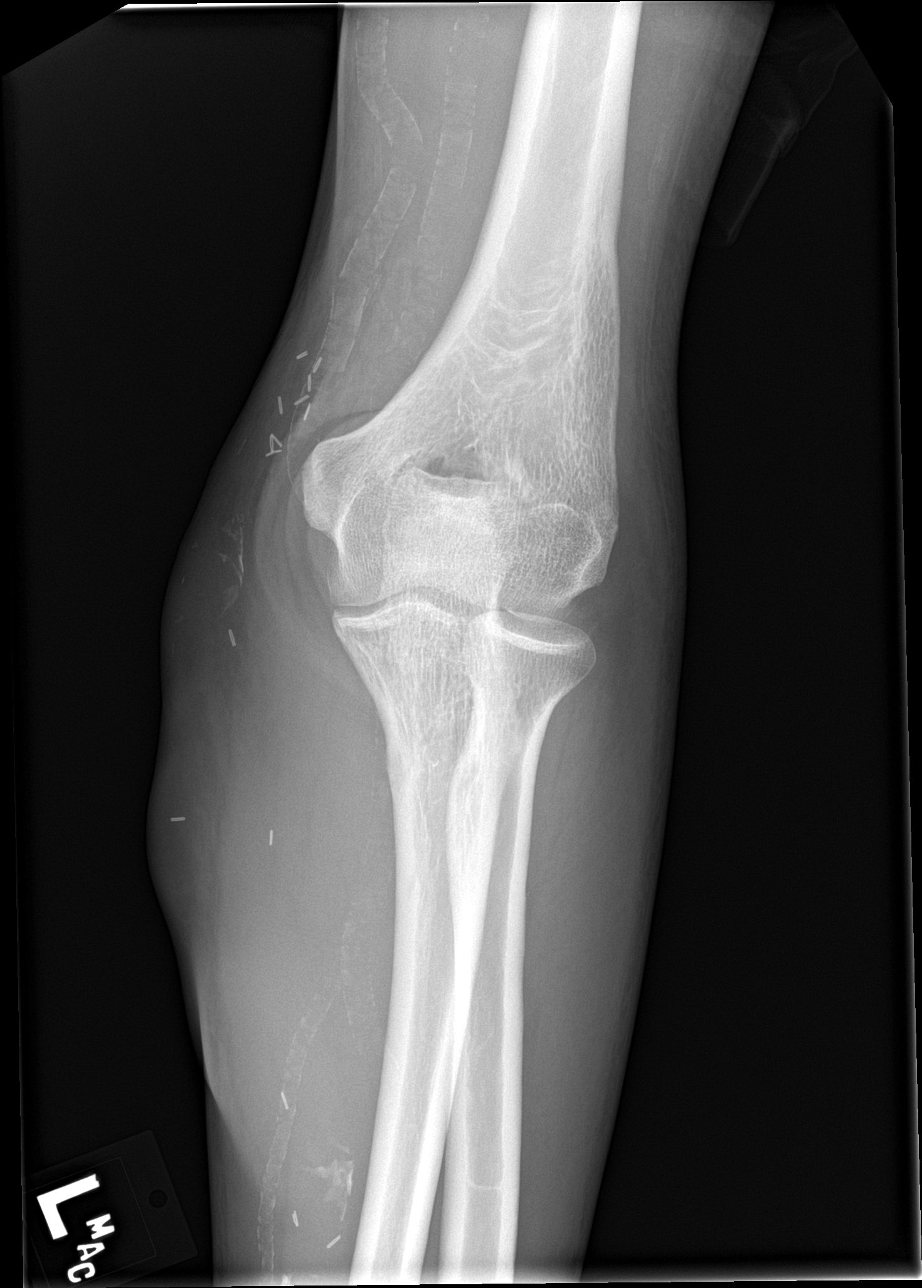

[elbow lat]
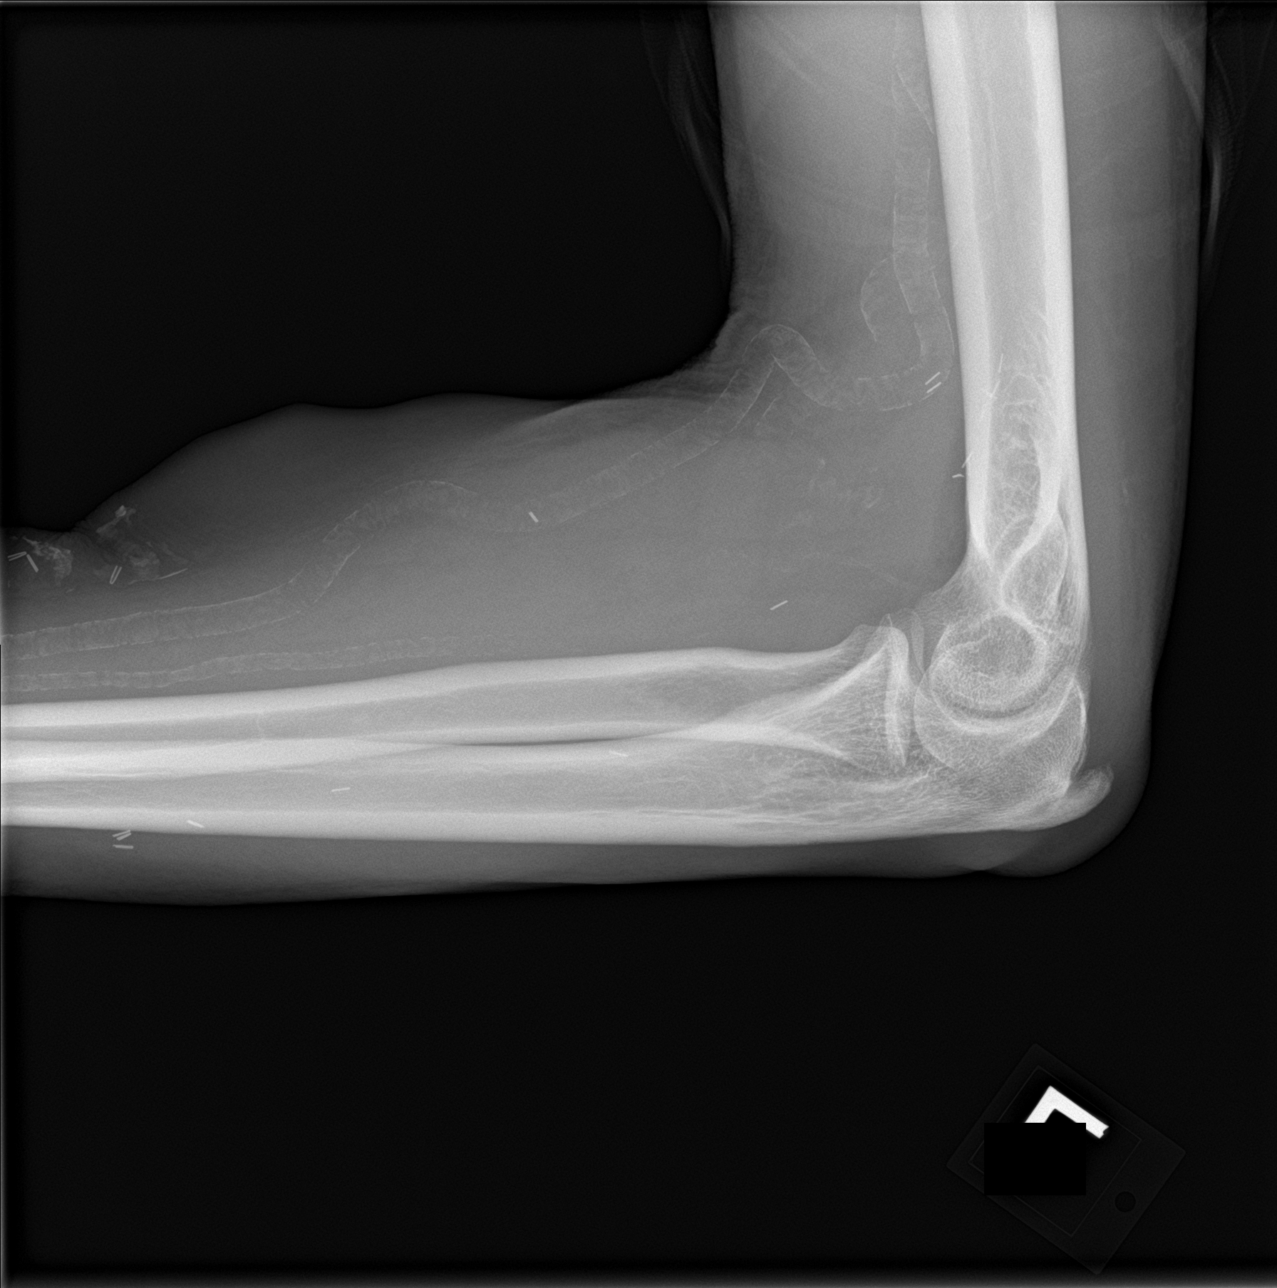

[4 of 4 positions shown; findings below may reference images not displayed]

FINDINGS: Extensive calcified peripheral vascular disease and multiple
surgical clips in the forearm which may relate to the presence of a
fistula/dialysis access, uncertain.

No evidence of left elbow joint effusion. Bone mineralization is
within normal limits. Degenerative spurring at the proximal ulna.
Preserved joint spaces and alignment. No acute osseous abnormality
identified.
IMPRESSION: No acute fracture or dislocation identified about the left elbow.

## 2021-04-29 IMAGING — DX RIGHT ELBOW - COMPLETE 3+ VIEW
3 series · 3 of 3 positions shown · non-contrast
Comparison: 03/10/2017 right forearm radiographs.

CLINICAL DATA: 52 y/o  M; abscess with drainage.

EXAM:
RIGHT ELBOW - COMPLETE 3+ VIEW

[elbow ap]
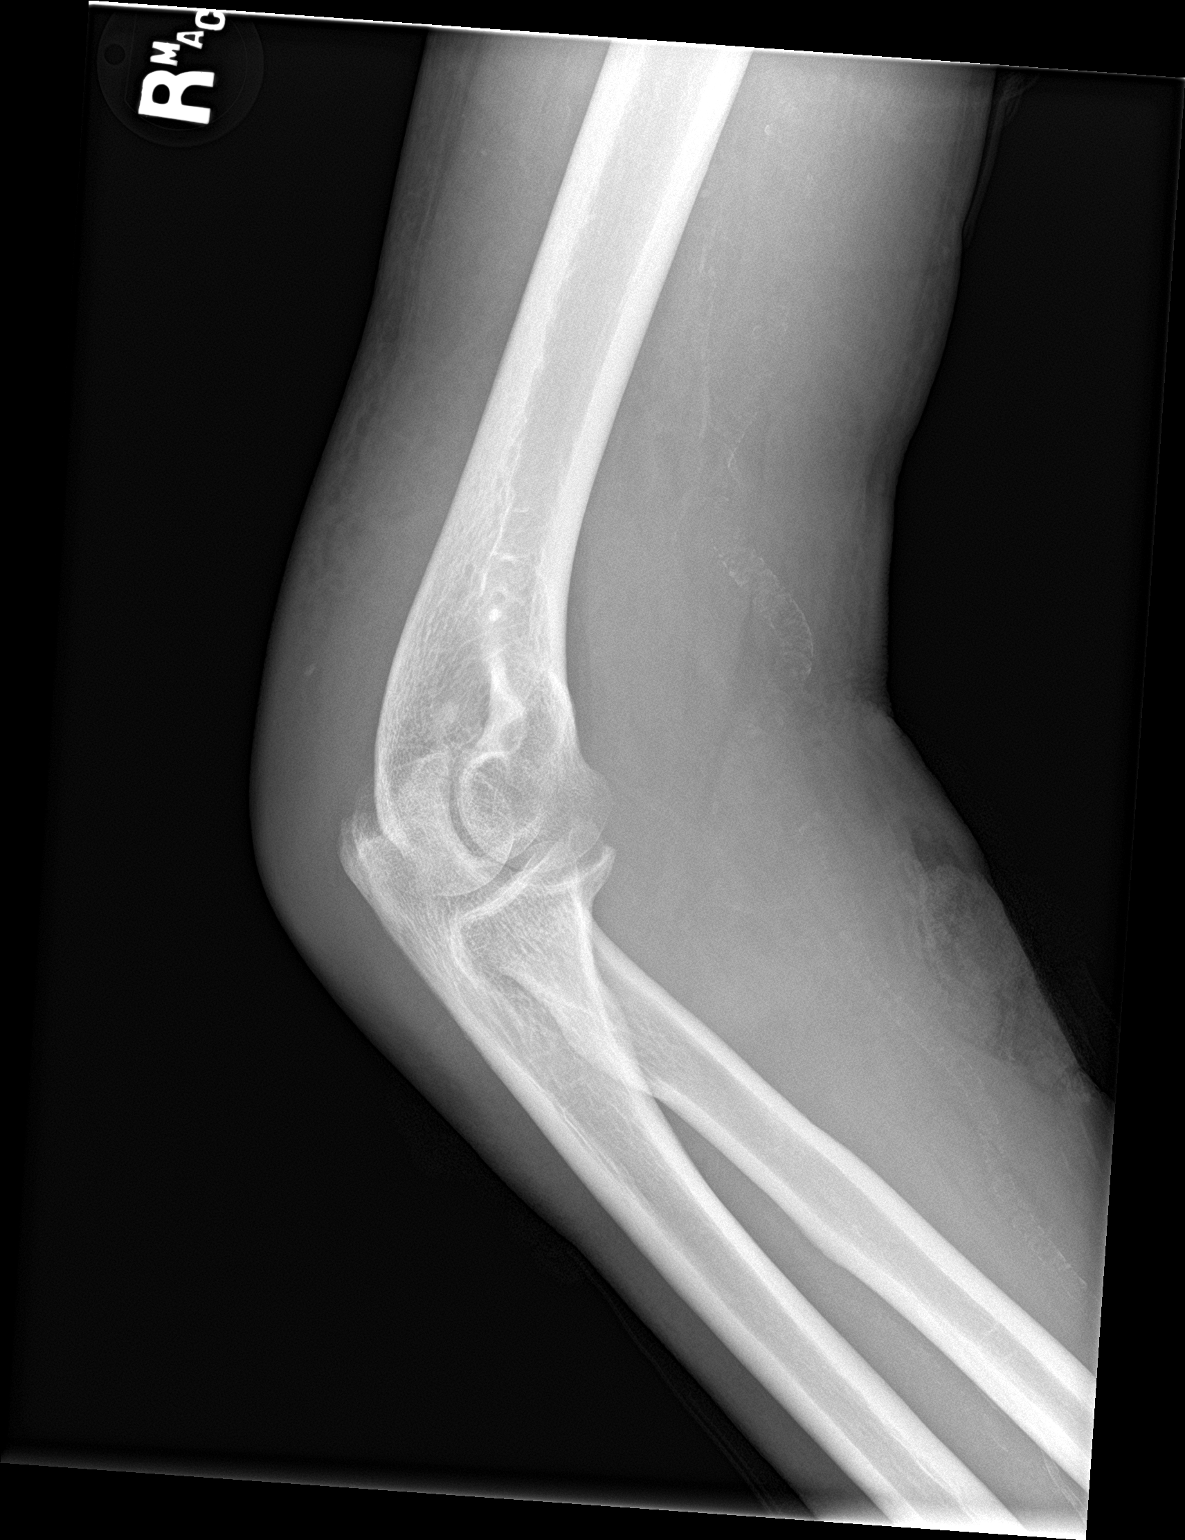

[elbow obl (1 of 2)]
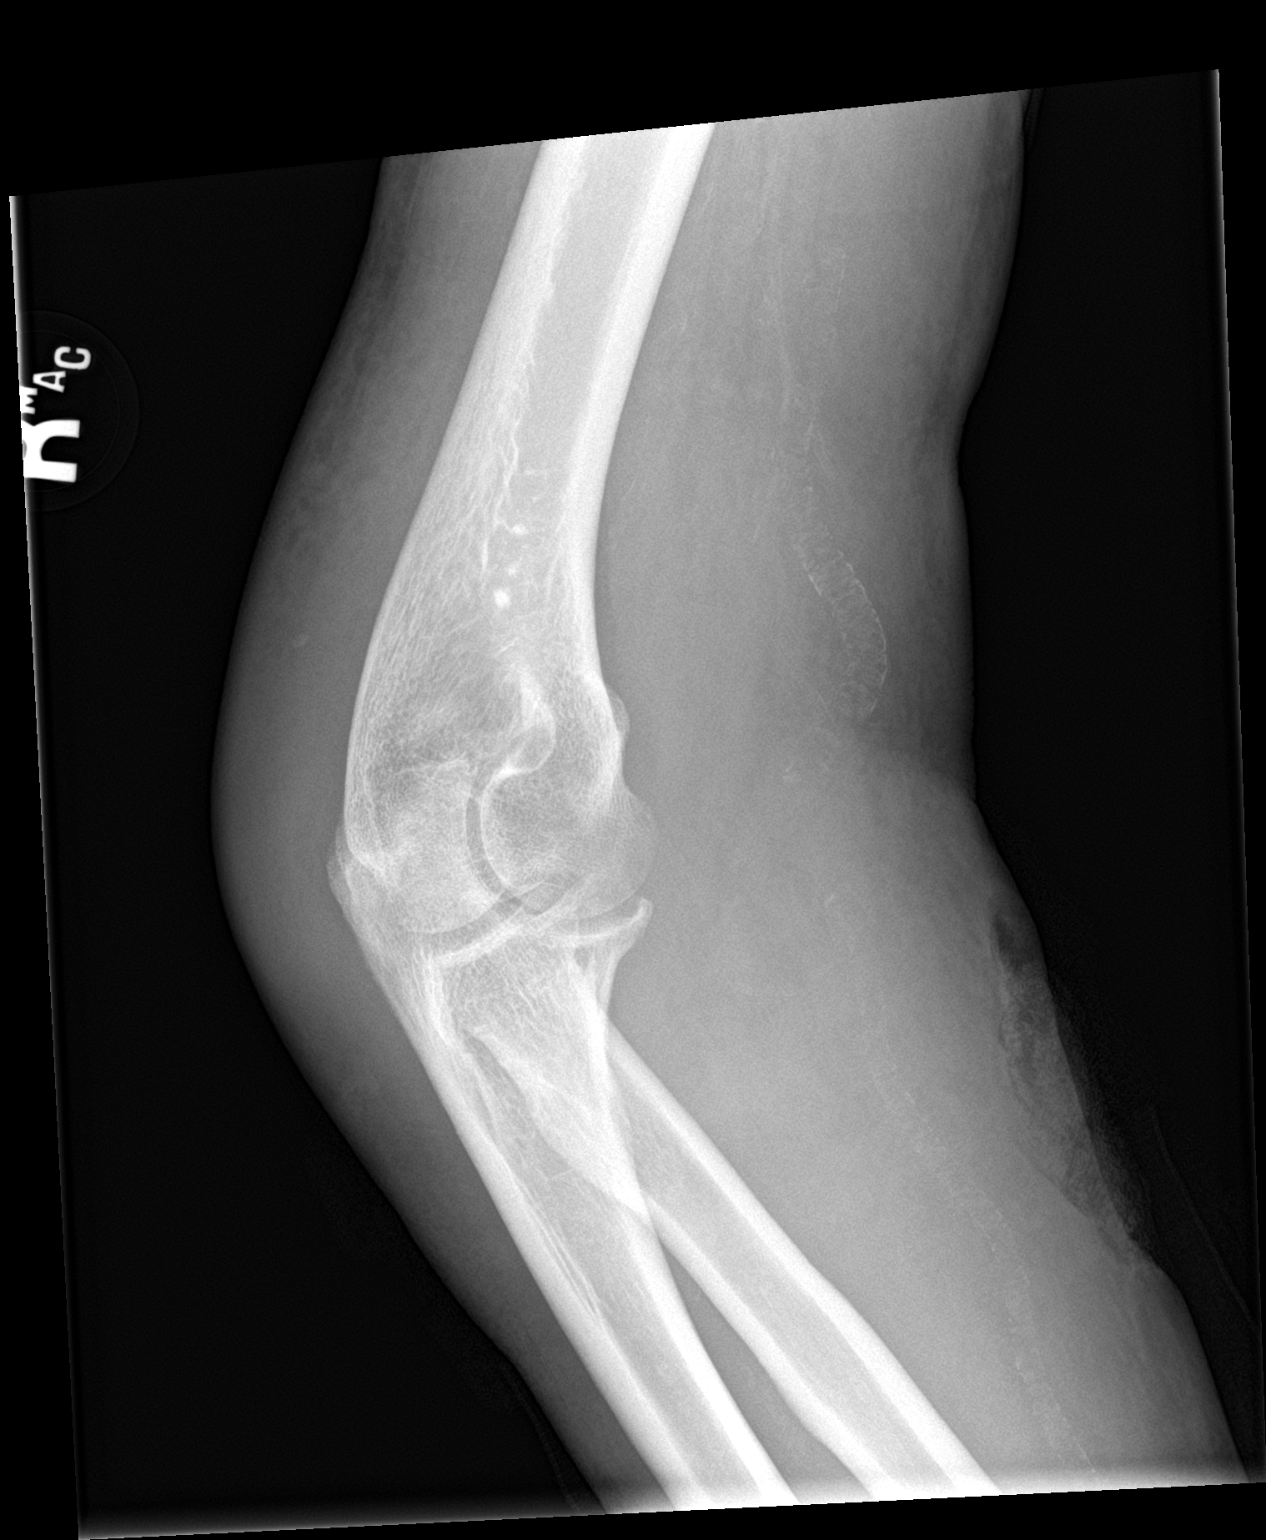

[elbow obl (2 of 2)]
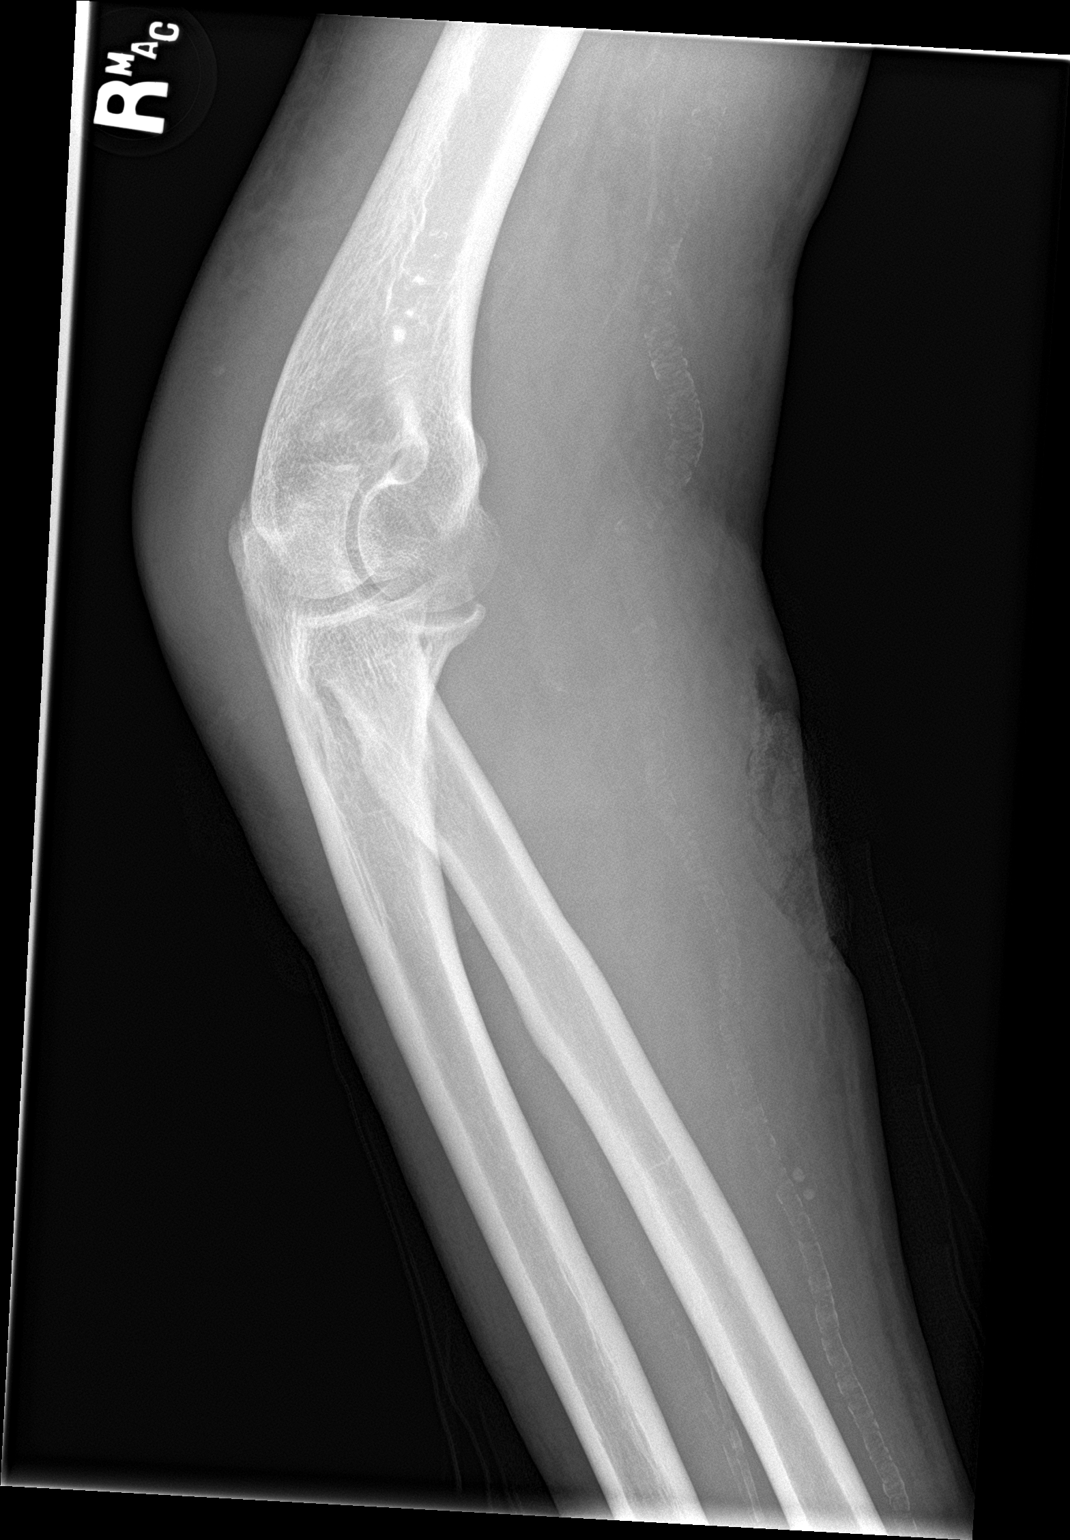

[3 of 3 positions shown; findings below may reference images not displayed]

FINDINGS: Soft tissue irregularity of the proximal forearm with dressing.
Swelling of soft tissues over the olecranon. Vascular calcifications
noted. No acute bony or articular abnormality identified.
IMPRESSION: No acute bony or articular abnormality identified. Soft tissue
irregularity of ventral proximal forearm with dressing. Mild
swelling of soft tissues over the olecranon.

## 2021-04-29 IMAGING — DX LUMBAR SPINE - COMPLETE 4+ VIEW
6 series · 6 of 6 positions shown · non-contrast
Comparison: None.

CLINICAL DATA: 52 y/o  M; fall with multiple complaints.

EXAM:
LUMBAR SPINE - COMPLETE 4+ VIEW

[l-spine ap]
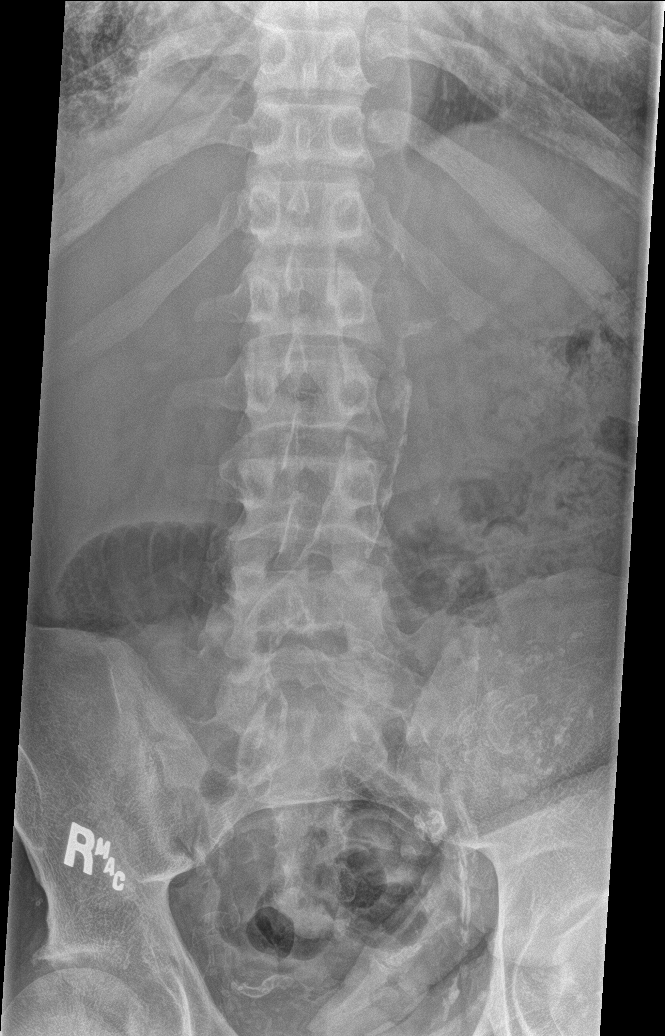

[l-spine obl (1 of 2)]
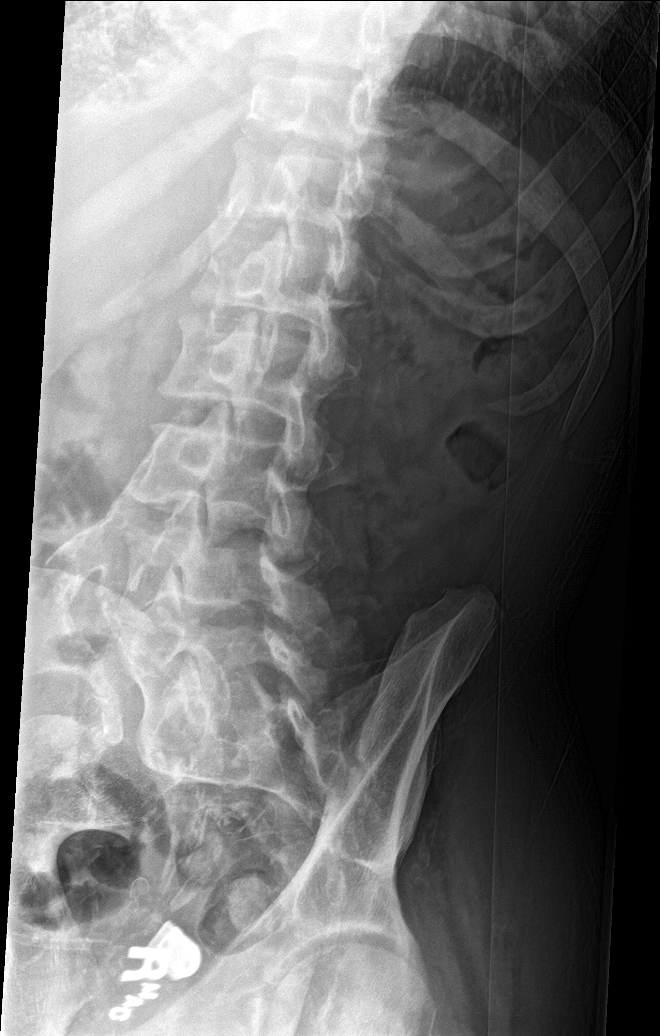

[l-spine obl (2 of 2)]
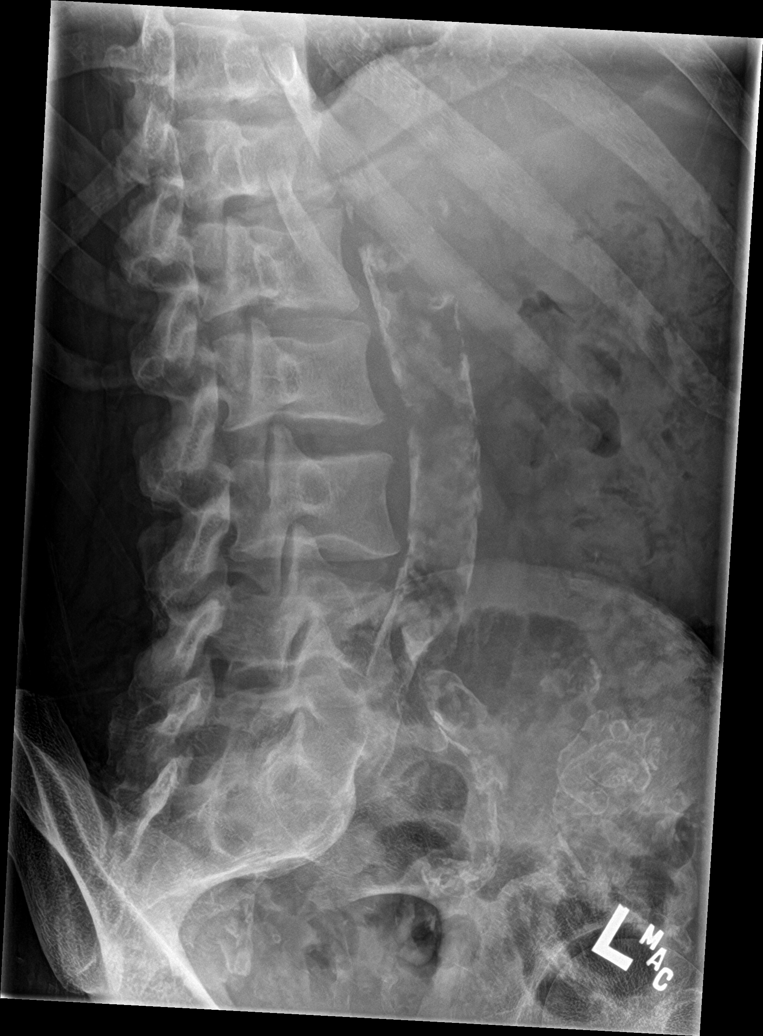

[l-spine lat]
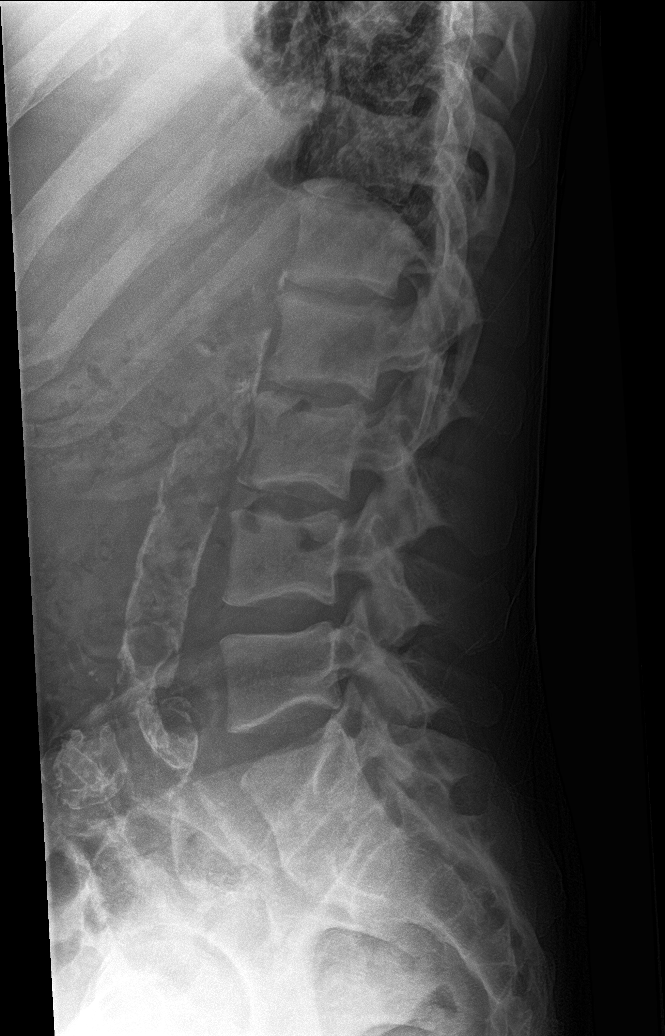

[l-spine spot (1 of 2)]
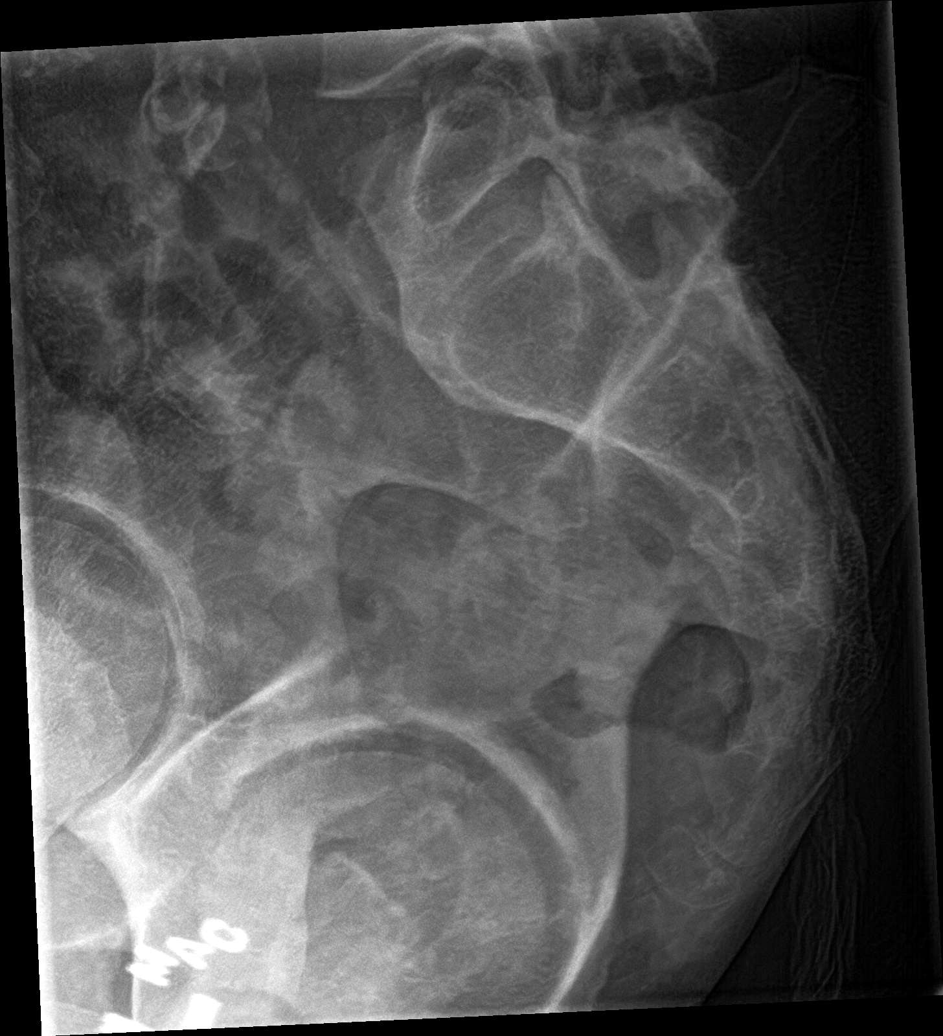

[l-spine spot (2 of 2)]
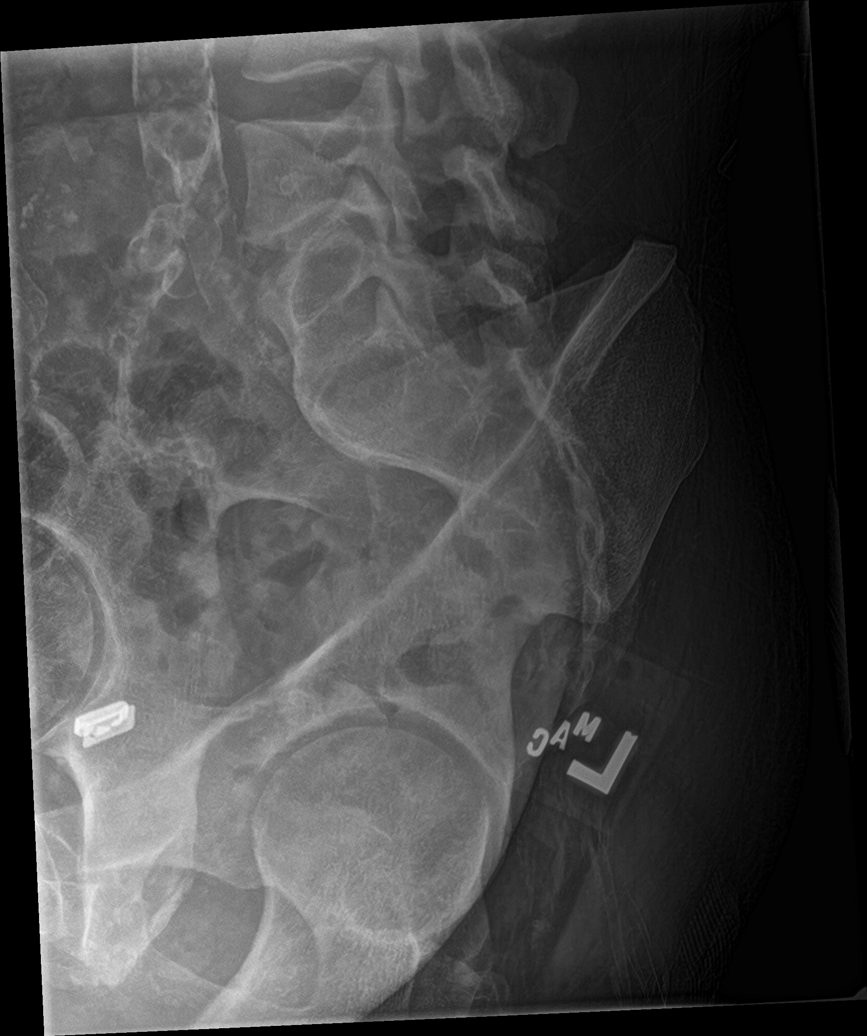

[6 of 6 positions shown; findings below may reference images not displayed]

FINDINGS: There is no evidence of lumbar spine fracture. Transitional L5
vertebral body. Alignment is normal. Intervertebral disc spaces are
maintained. Abdominal aortic calcific atherosclerosis.
IMPRESSION: Negative.

## 2021-07-30 IMAGING — CR CHEST - 2 VIEW
2 series · 2 of 2 positions shown · non-contrast
Comparison: Radiograph 12/06/2018

CLINICAL DATA: Shortness of breath.

EXAM:
CHEST - 2 VIEW

[w chest lat]
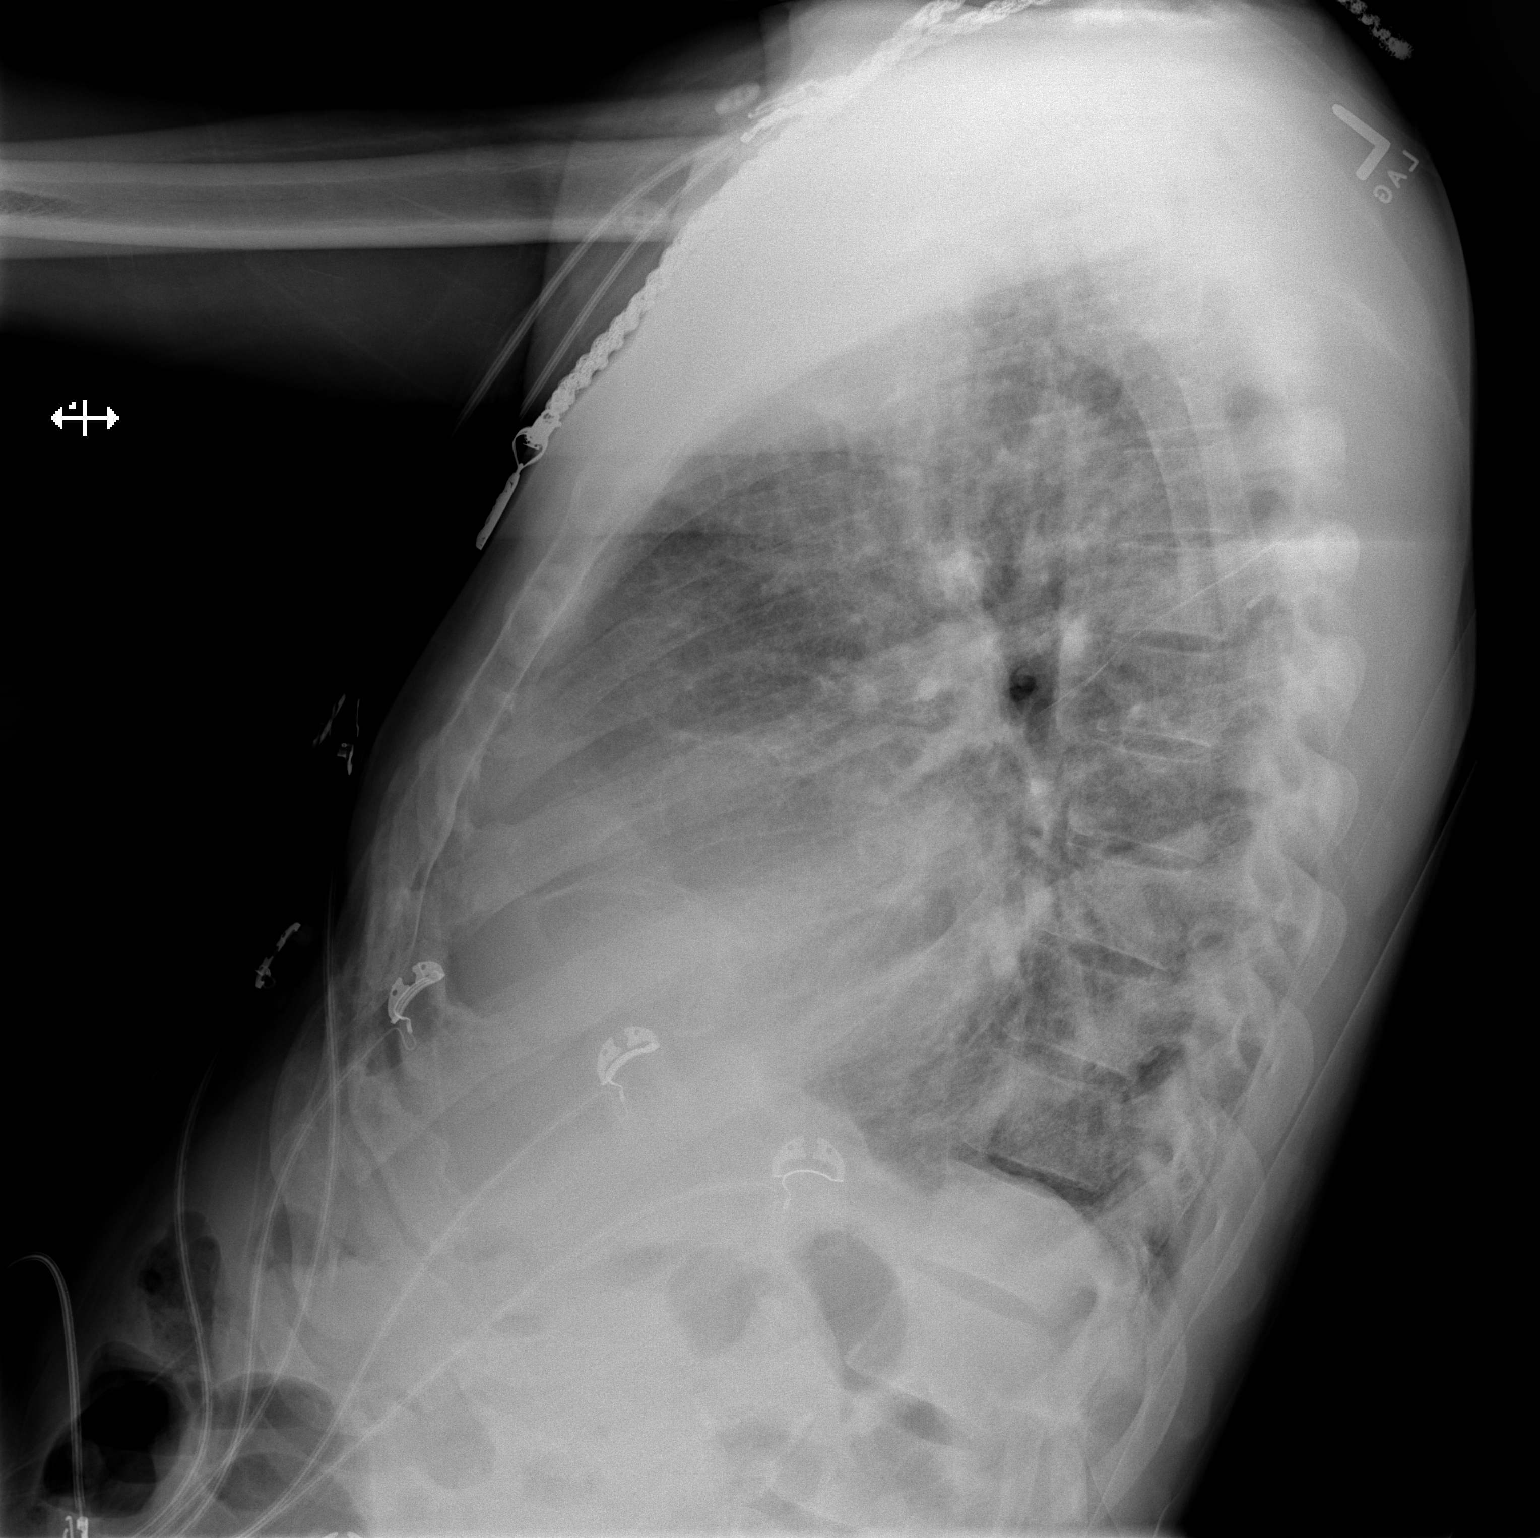

[x chest ap]
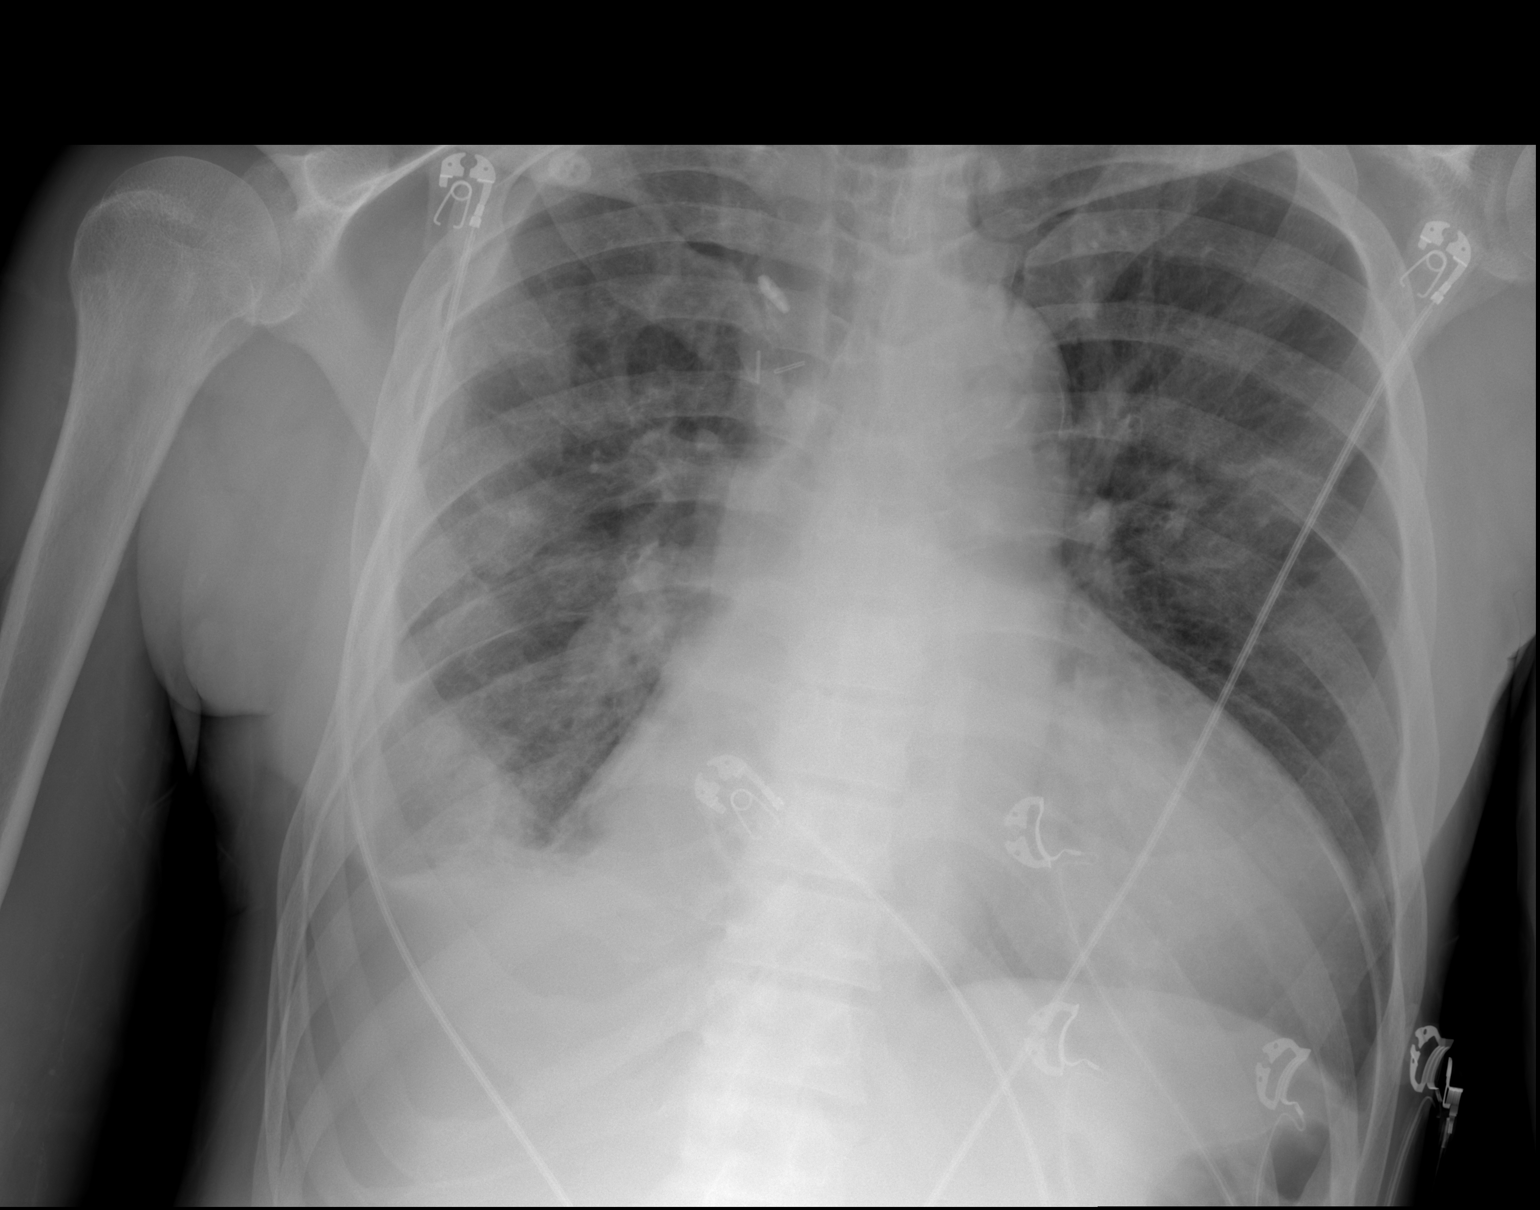

[2 of 2 positions shown; findings below may reference images not displayed]

FINDINGS: Cardiomegaly stable. Unchanged mediastinal contours. Chronic right
pleural effusion and pleuroparenchymal opacity. Increased vascular
congestion from prior exam. No pneumothorax or confluent airspace
disease.
IMPRESSION: 1. Increased vascular congestion from prior exam.
2. Unchanged cardiomegaly and chronic right pleural effusion and
pleuroparenchymal opacity.

## 2021-07-31 IMAGING — US ULTRASOUND CHEST
1 series · 2 of 2 positions shown · non-contrast
Comparison: Chest x-ray earlier today.

CLINICAL DATA: Shortness of breath with chest x-ray demonstrating
probable right pleural effusion. Status post prior right
thoracentesis in 4858.

EXAM:
CHEST ULTRASOUND

[Series 1: ir (id) (id)/(id)/(id) ir · 2 of 2 slices shown]
[im 1/2]
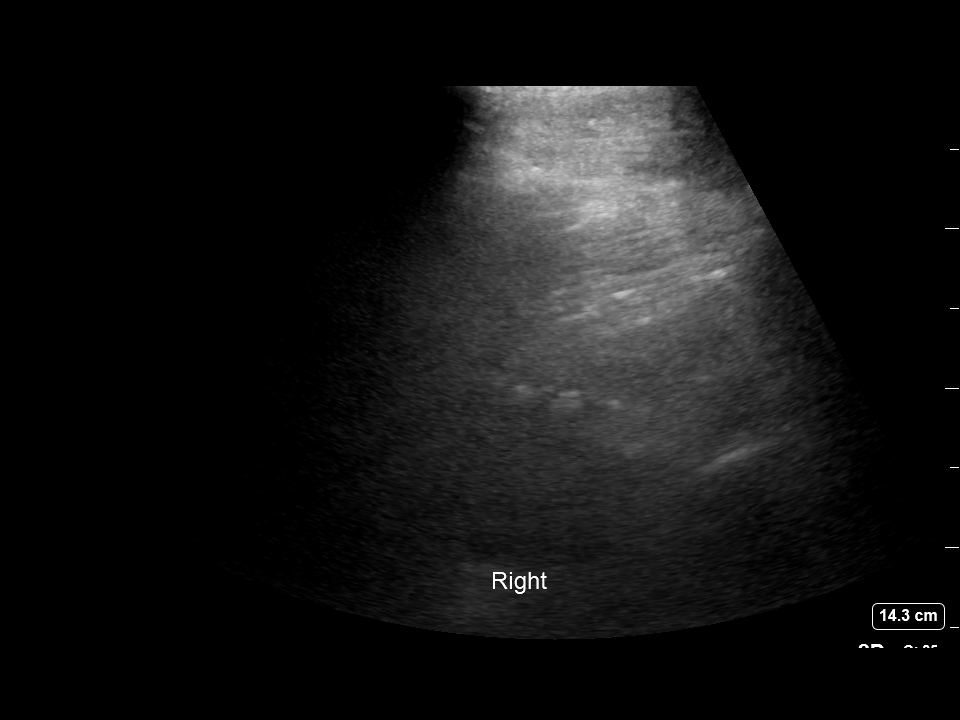
[im 2/2]
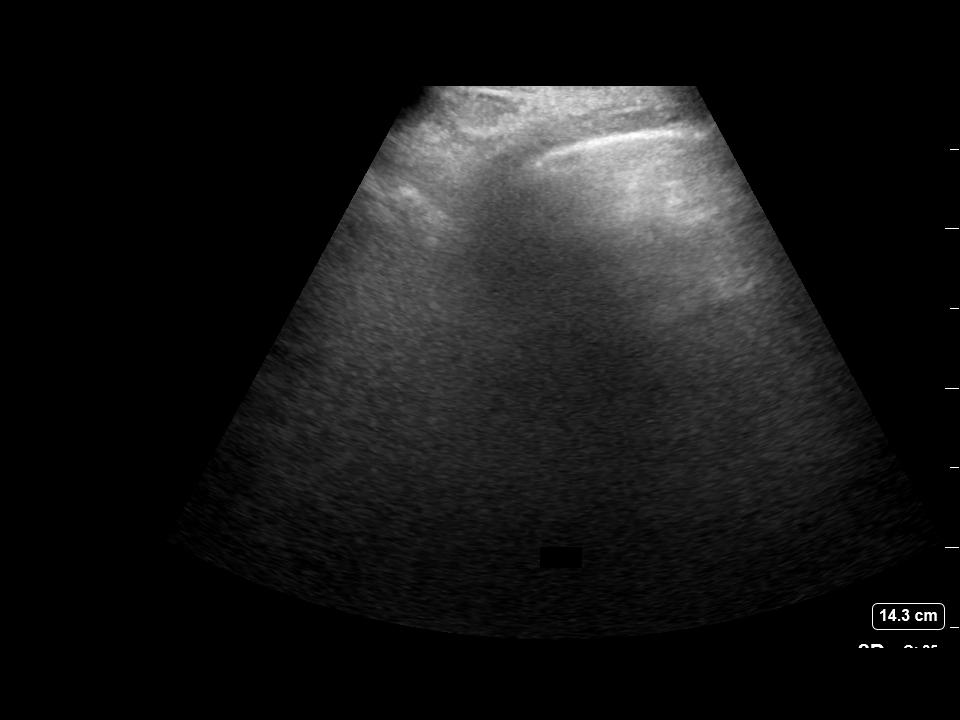

[2 of 2 positions shown; findings below may reference images not displayed]

FINDINGS: By ultrasound, there is no evidence of pleural fluid in either the
right or left pleural space.
IMPRESSION: No pleural fluid identified by ultrasound. Thoracentesis was not
performed.

## 2021-07-31 IMAGING — DX PORTABLE CHEST - 1 VIEW
1 series · 1 of 1 positions shown · non-contrast
Comparison: 03/11/2019

CLINICAL DATA: Shortness of breath, volume overload

EXAM:
PORTABLE CHEST 1 VIEW

[chest ap]
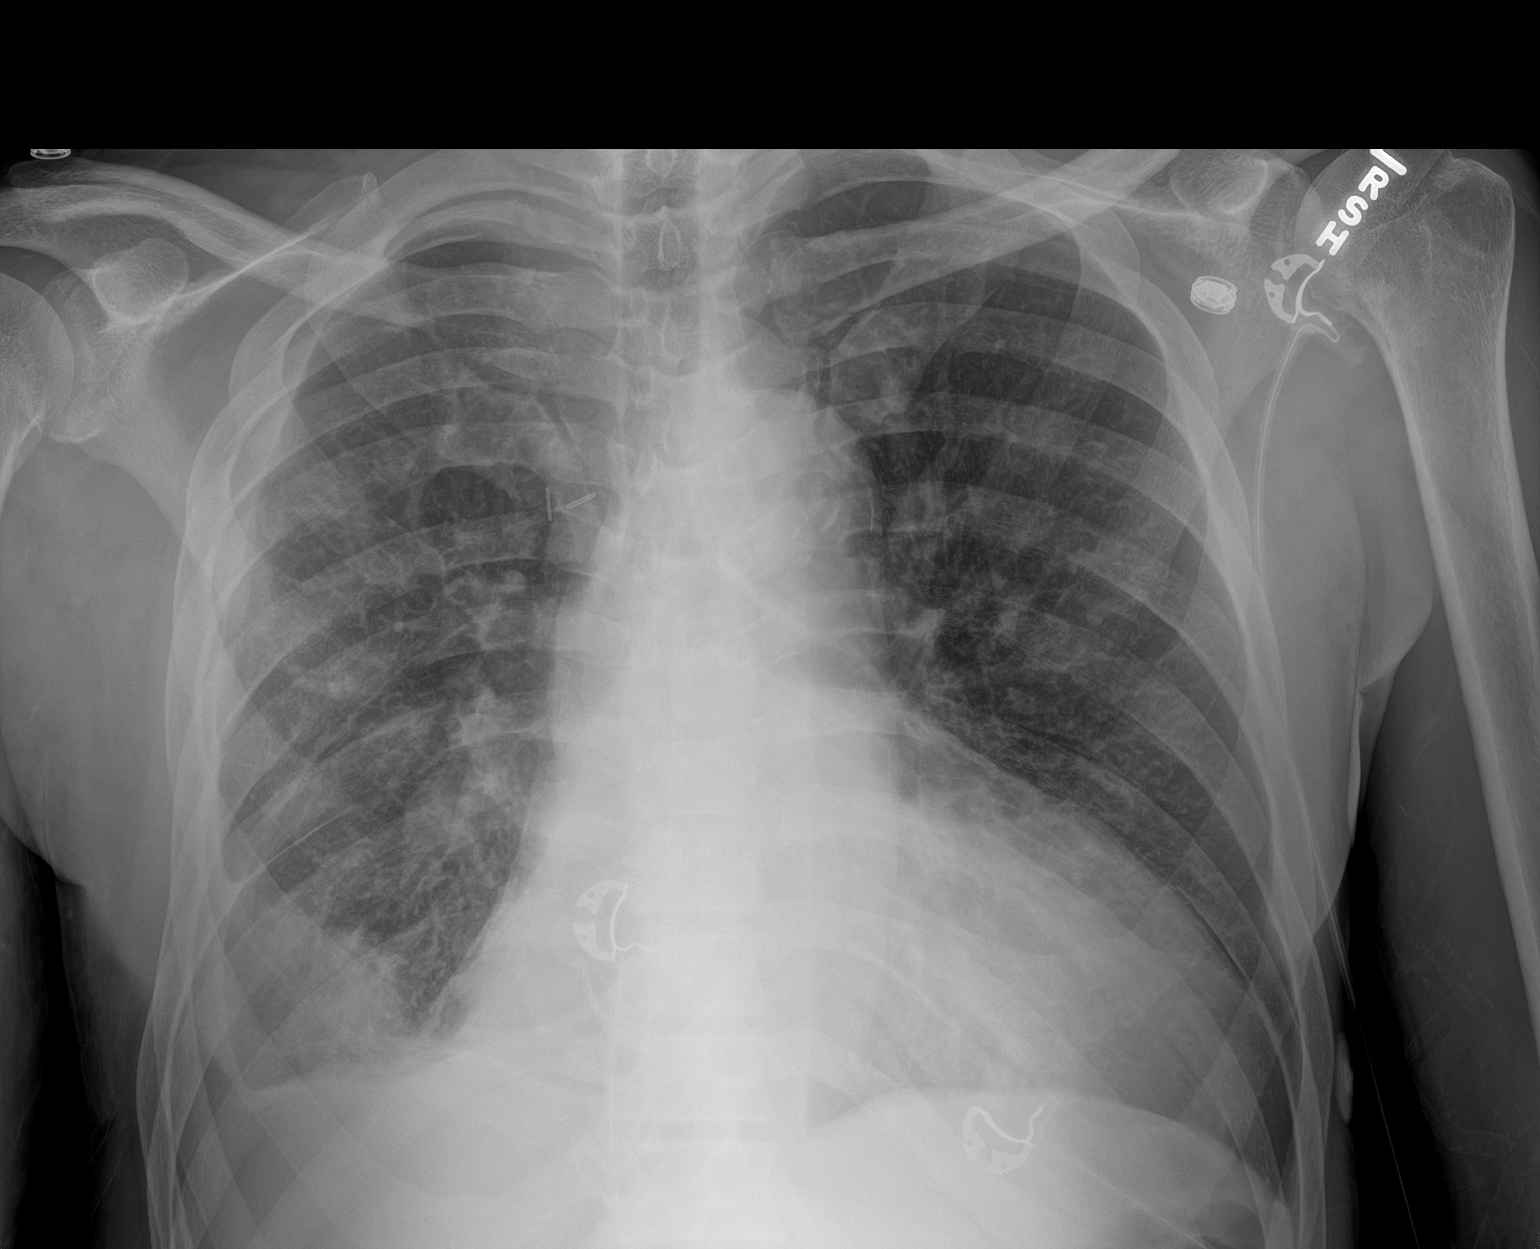

[1 of 1 positions shown; findings below may reference images not displayed]

FINDINGS: Cardiomegaly. Chronic right pleural effusion and right basilar
opacity. Patchy airspace opacities throughout both lungs, similar to
prior study. This is greater on the right. No left effusion. No
acute bony abnormality.
IMPRESSION: Patchy bilateral airspace opacities, right greater than left,
similar to prior study. This could reflect edema or infection.

Stable chronic right pleural effusion and right basilar opacity.

Stable cardiomegaly.

## 2021-08-01 IMAGING — CR CHEST - 2 VIEW
2 series · 2 of 2 positions shown · non-contrast
Comparison: March 12, 2019

CLINICAL DATA: Shortness of breath

EXAM:
CHEST - 2 VIEW

[w chest lat]
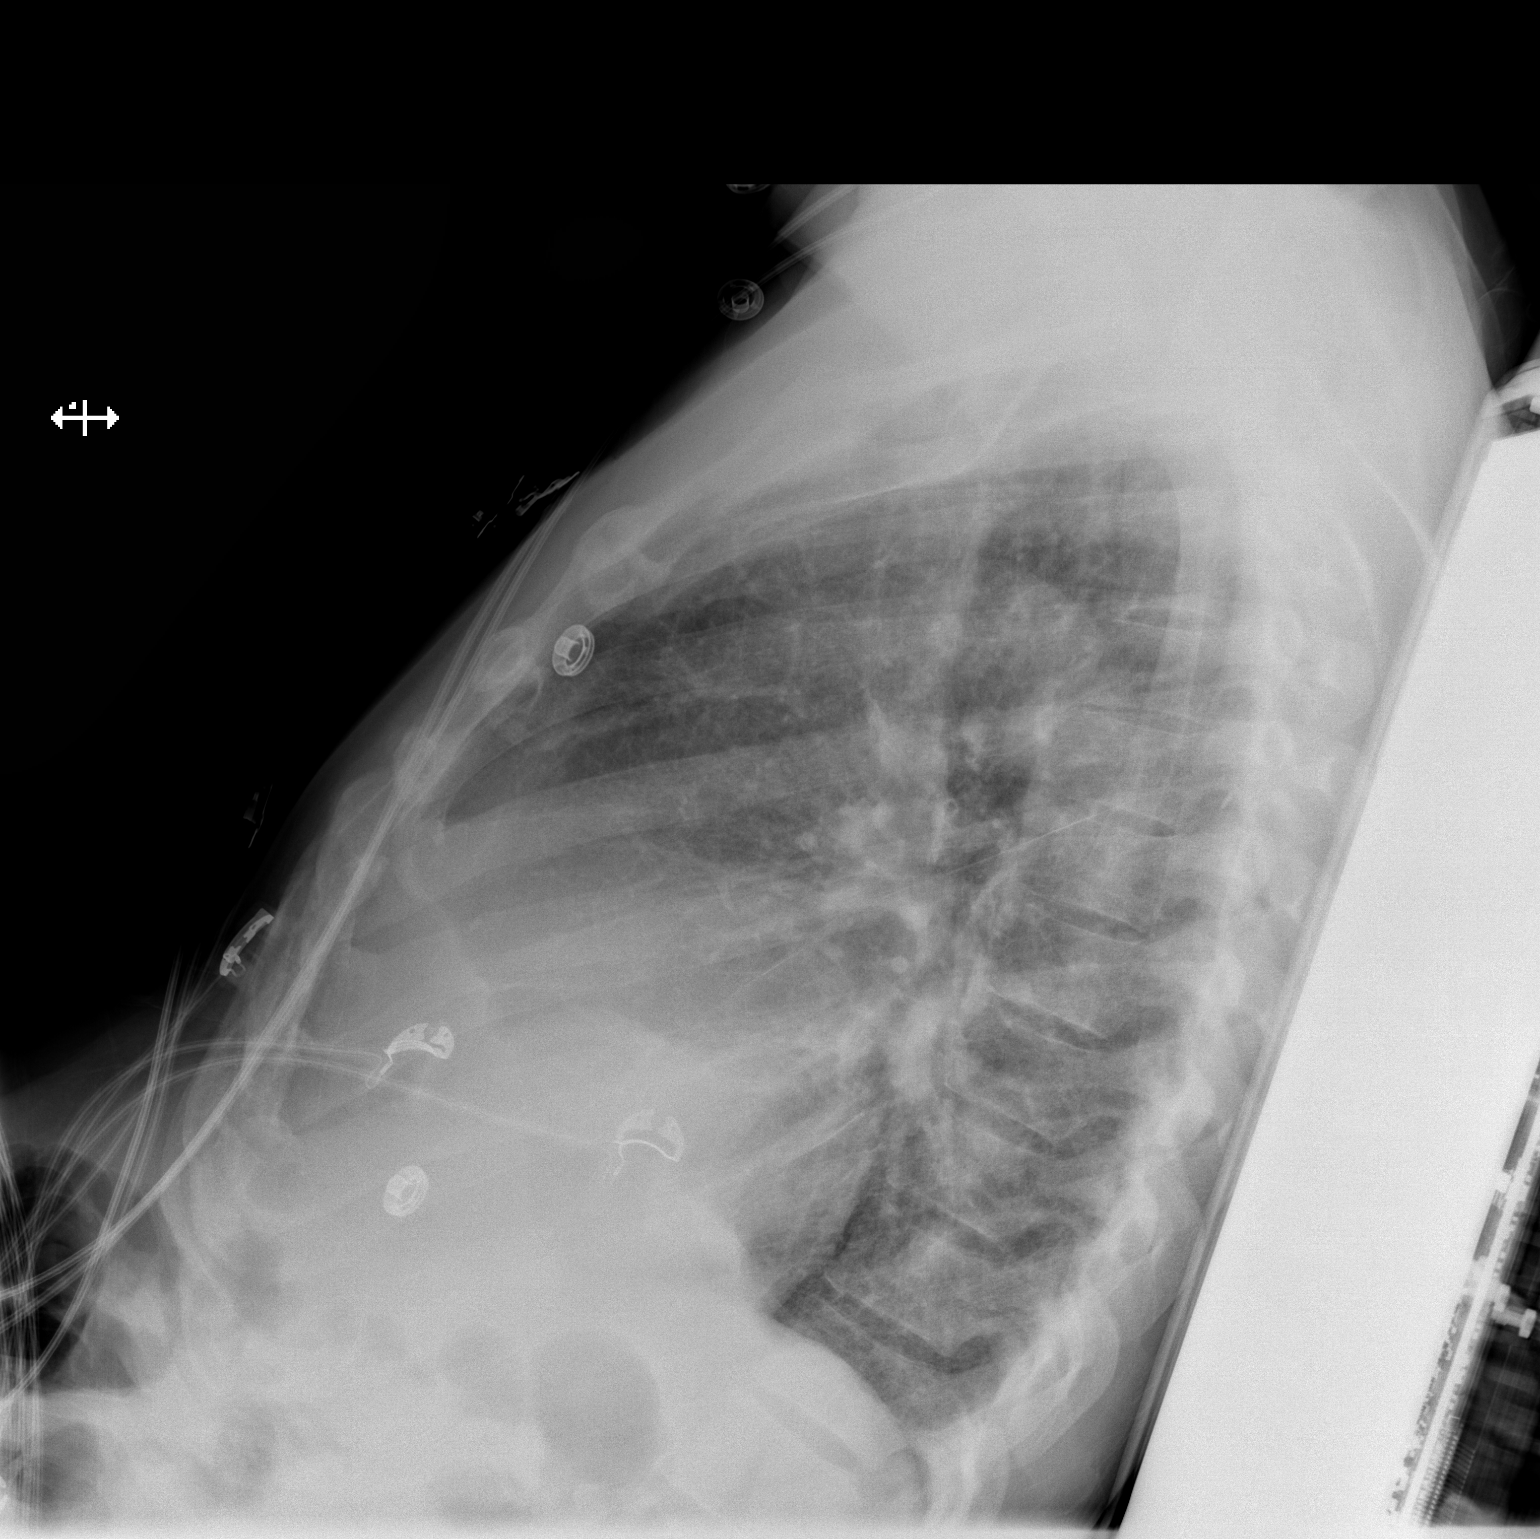

[x chest ap]
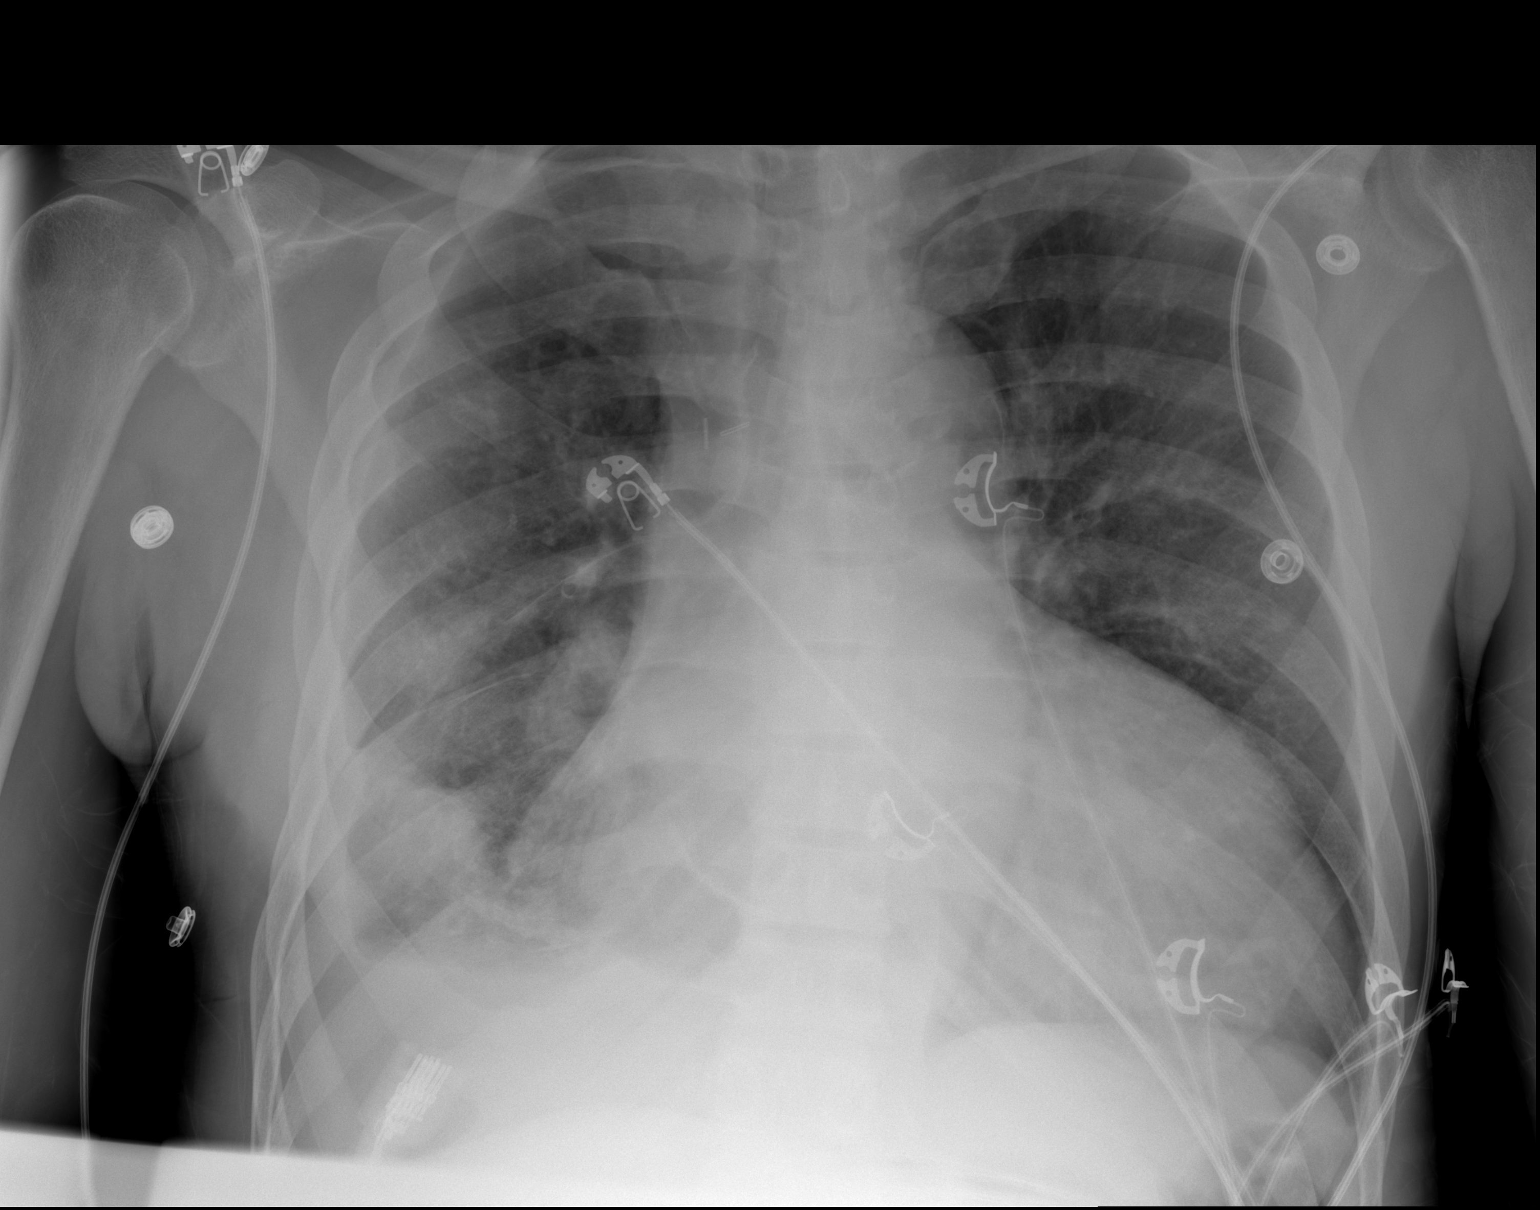

[2 of 2 positions shown; findings below may reference images not displayed]

FINDINGS: The heart size is significantly enlarged. There appears to be a
trace to small right-sided pleural effusion. No significant
left-sided pleural effusion. Airspace opacities are again noted
bilaterally, greatest at the right lung base. No pneumothorax.
IMPRESSION: Stable appearance of the chest. Persistent cardiomegaly and blunting
of the right costophrenic angle with a right lower lobe airspace
opacity.

## 2021-08-04 IMAGING — CR CHEST - 2 VIEW
2 series · 2 of 2 positions shown · non-contrast
Comparison: 03/13/2019, 12/06/2018, CT chest 08/28/2018

CLINICAL DATA: Cough and congestion

EXAM:
CHEST - 2 VIEW

[w chest pa]
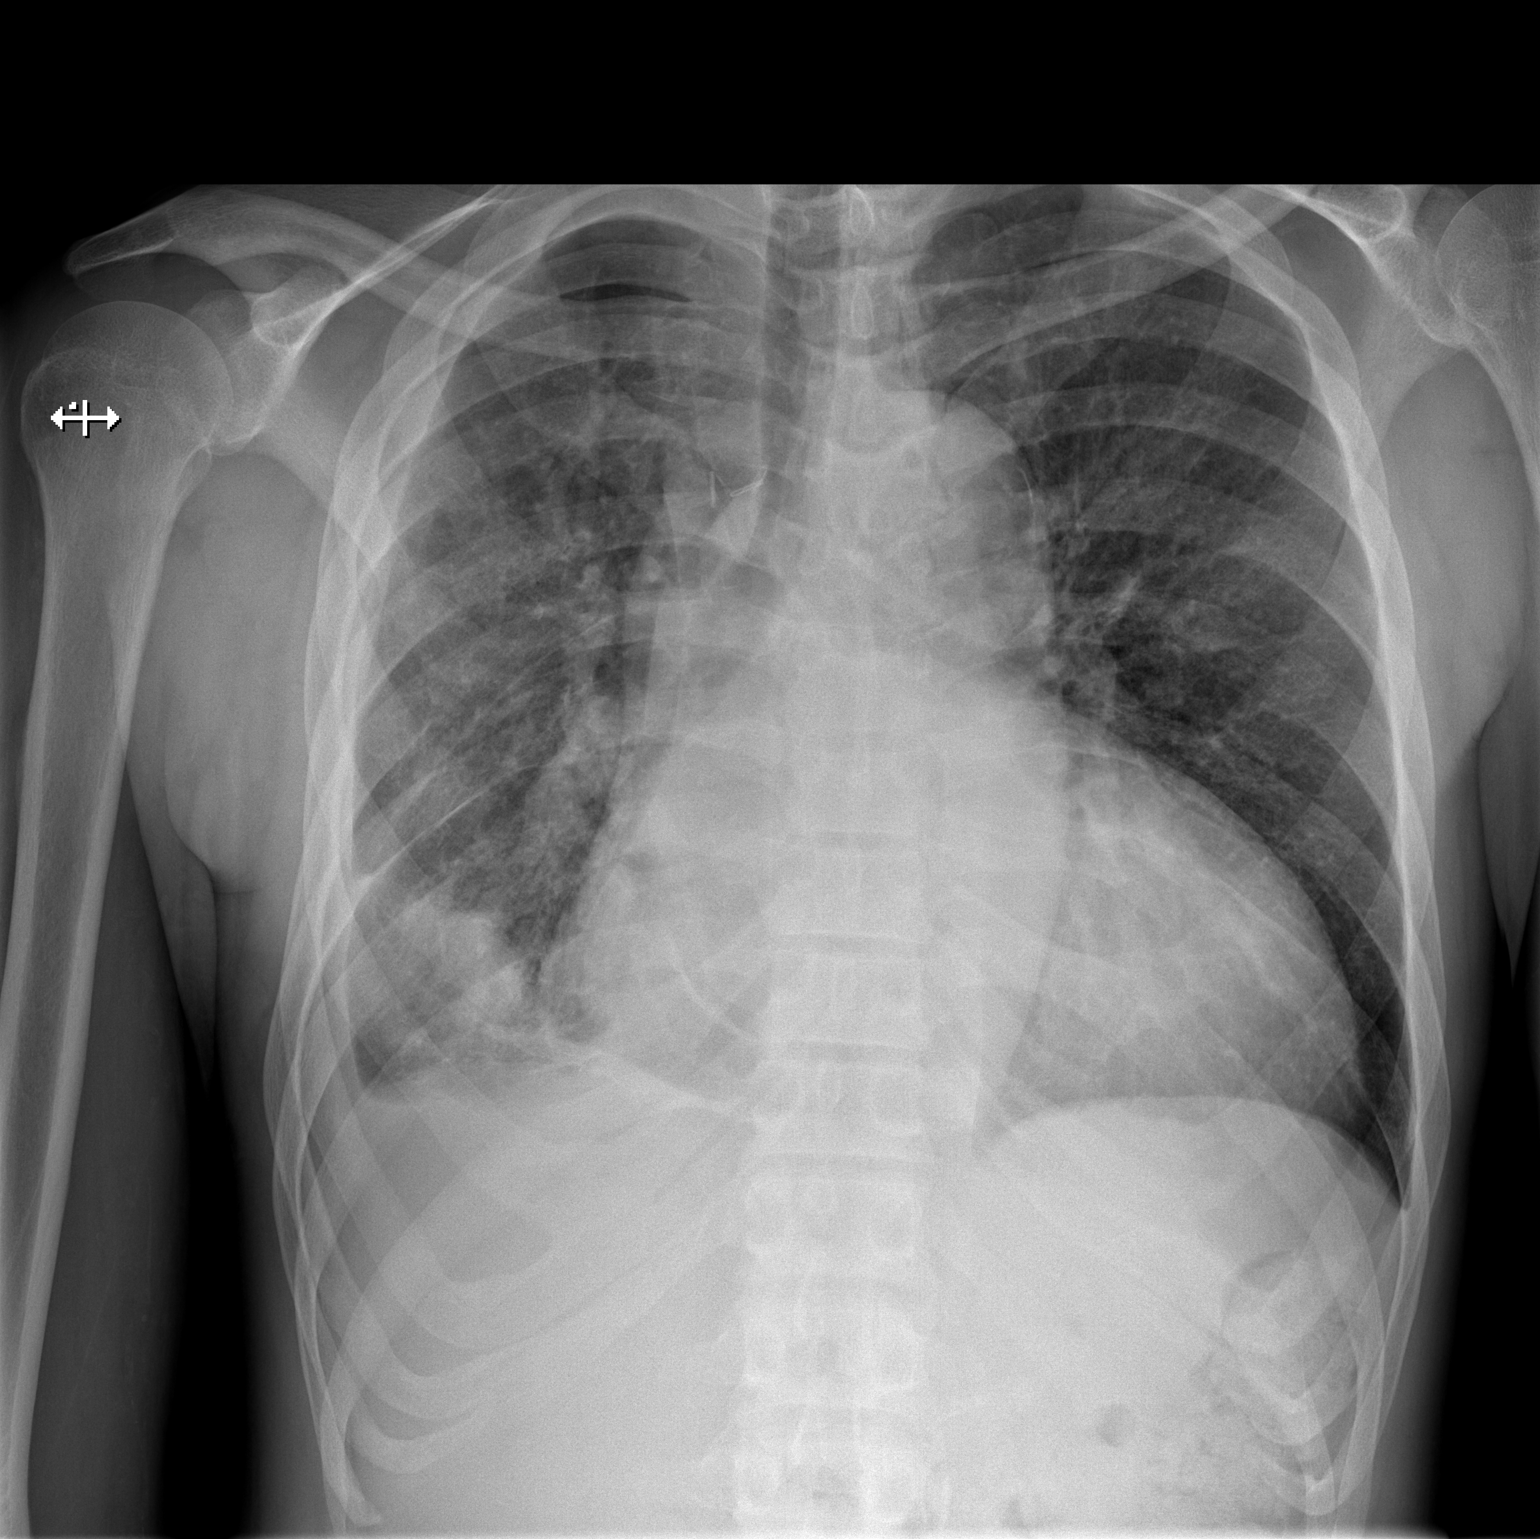

[w chest lat]
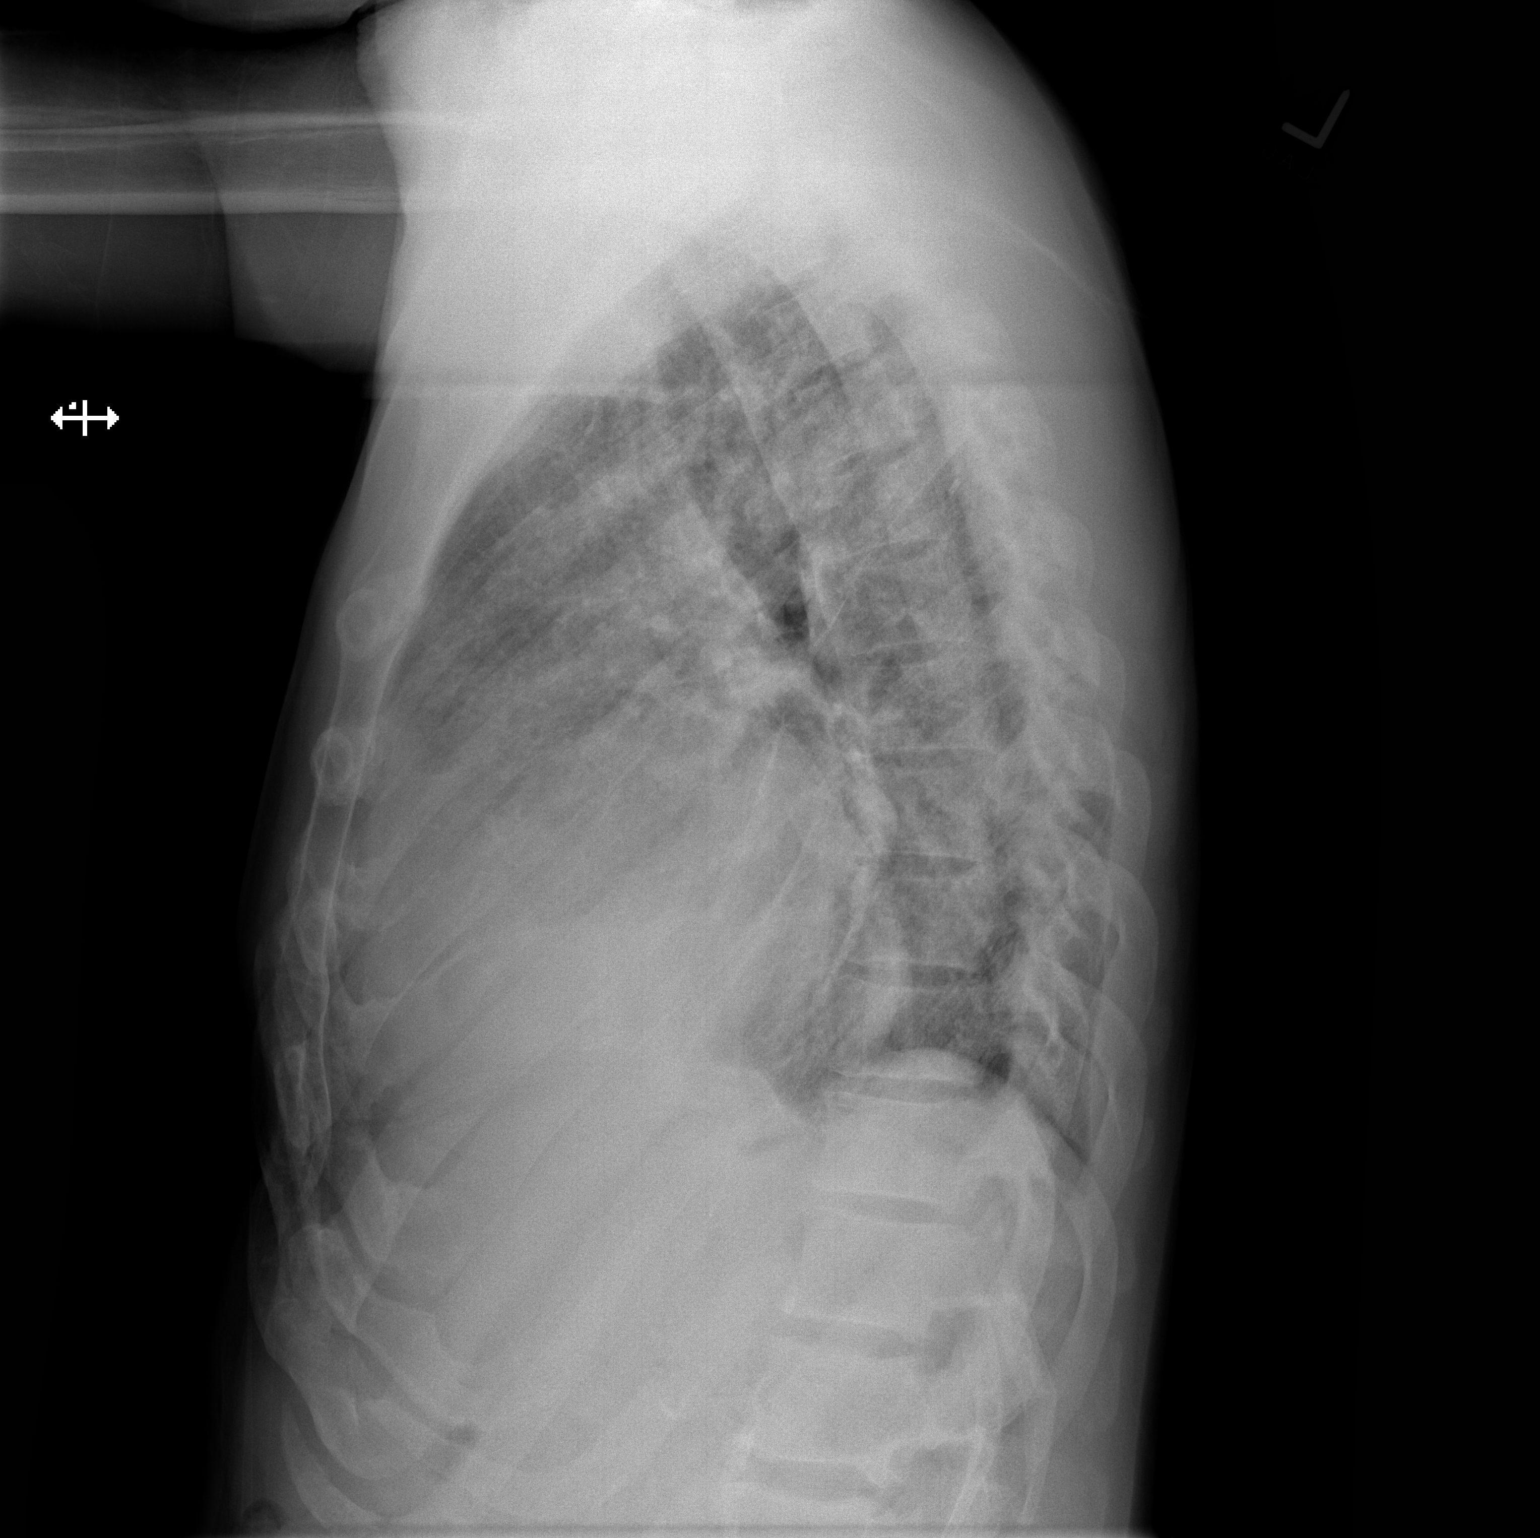

[2 of 2 positions shown; findings below may reference images not displayed]

FINDINGS: Moderate cardiomegaly. Aortic atherosclerosis. Similar appearance of
right pleural thickening or effusion with somewhat masslike opacity
at the right base. No pneumothorax.
IMPRESSION: 1. No significant change in cardiomegaly, right pleural effusion or
thickening and masslike opacity at the right lung base.

## 2021-08-07 IMAGING — CR CHEST - 2 VIEW
3 series · 3 of 3 positions shown · non-contrast
Comparison: 03/16/2019 and earlier.

CLINICAL DATA: 52-year-old with several week history of cough,
chest congestion and shortness of breath, especially when lying
down. Patient states that he missed his most recent dialysis
appointment. Former smoker.

EXAM:
CHEST - 2 VIEW

[w chest lat]
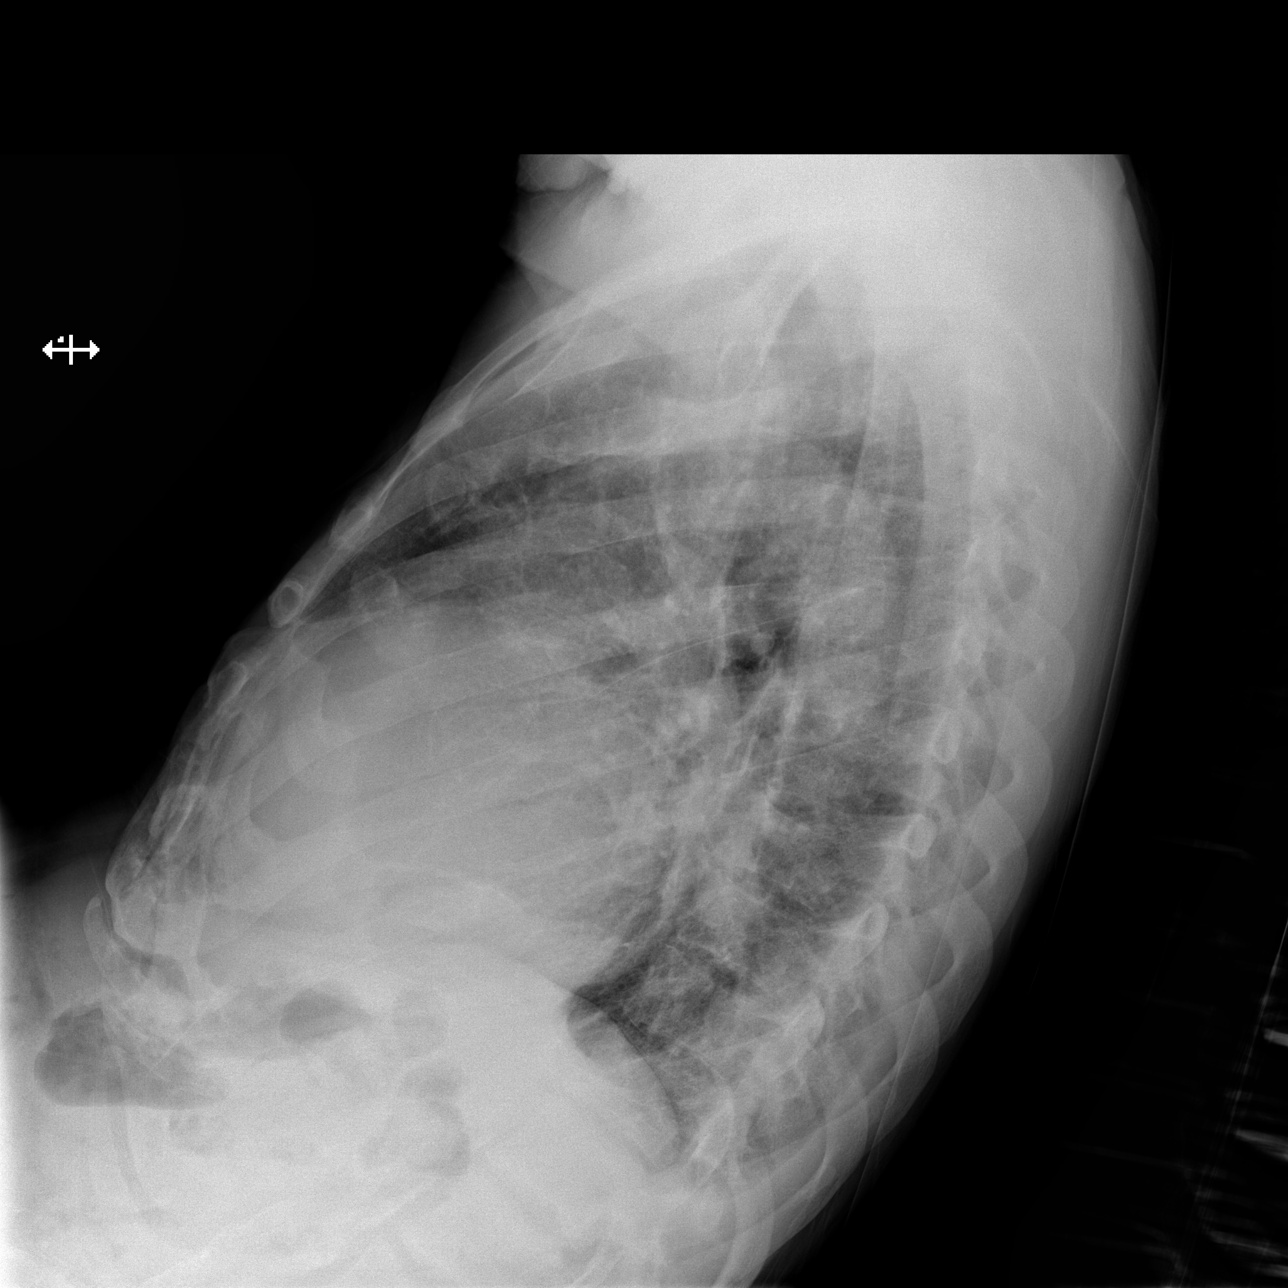

[x chest ap (1 of 2)]
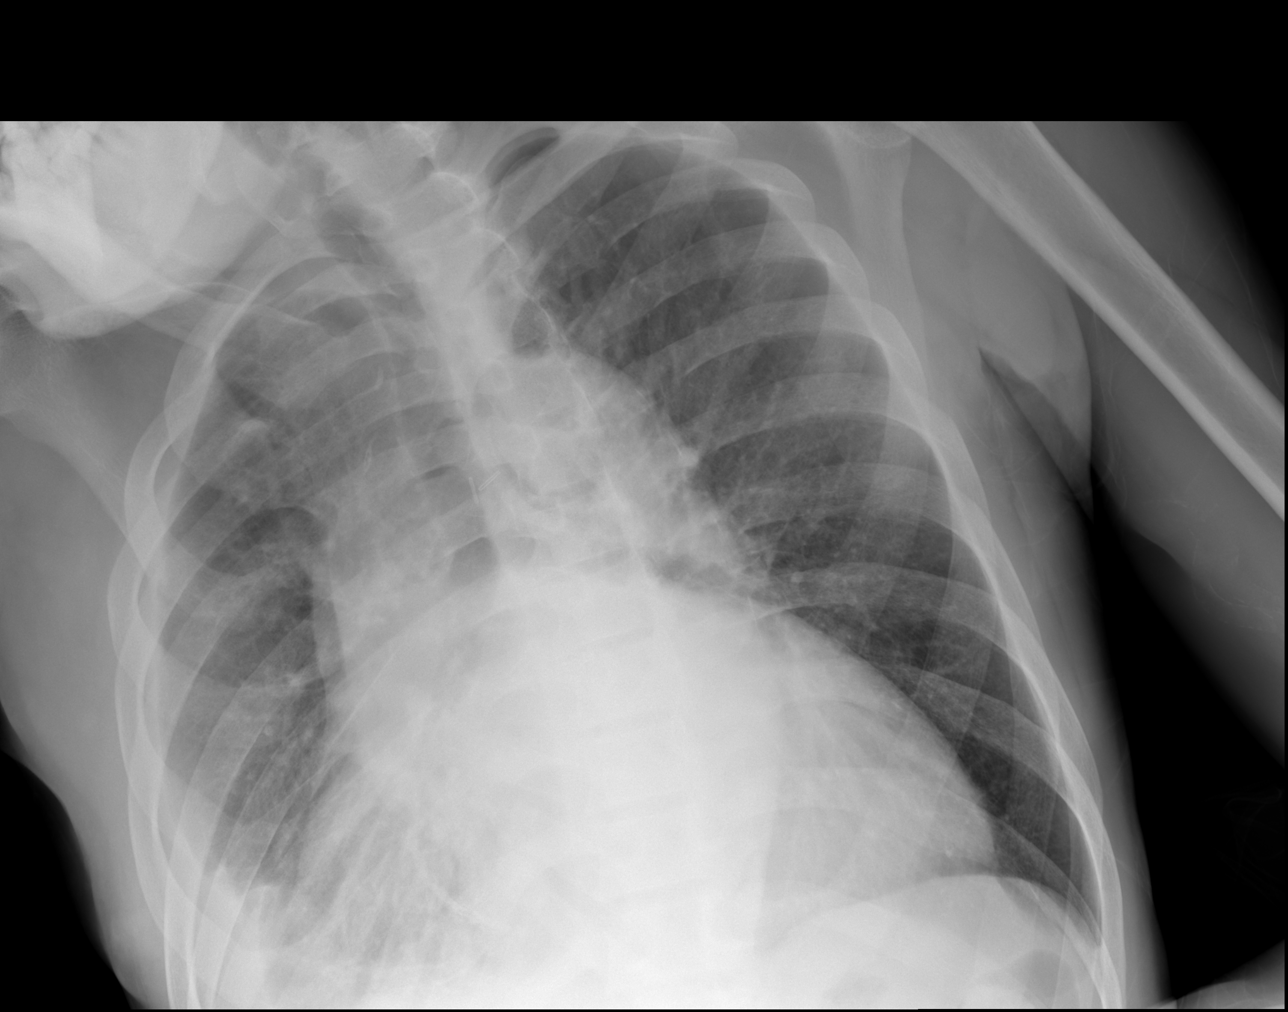

[x chest ap (2 of 2)]
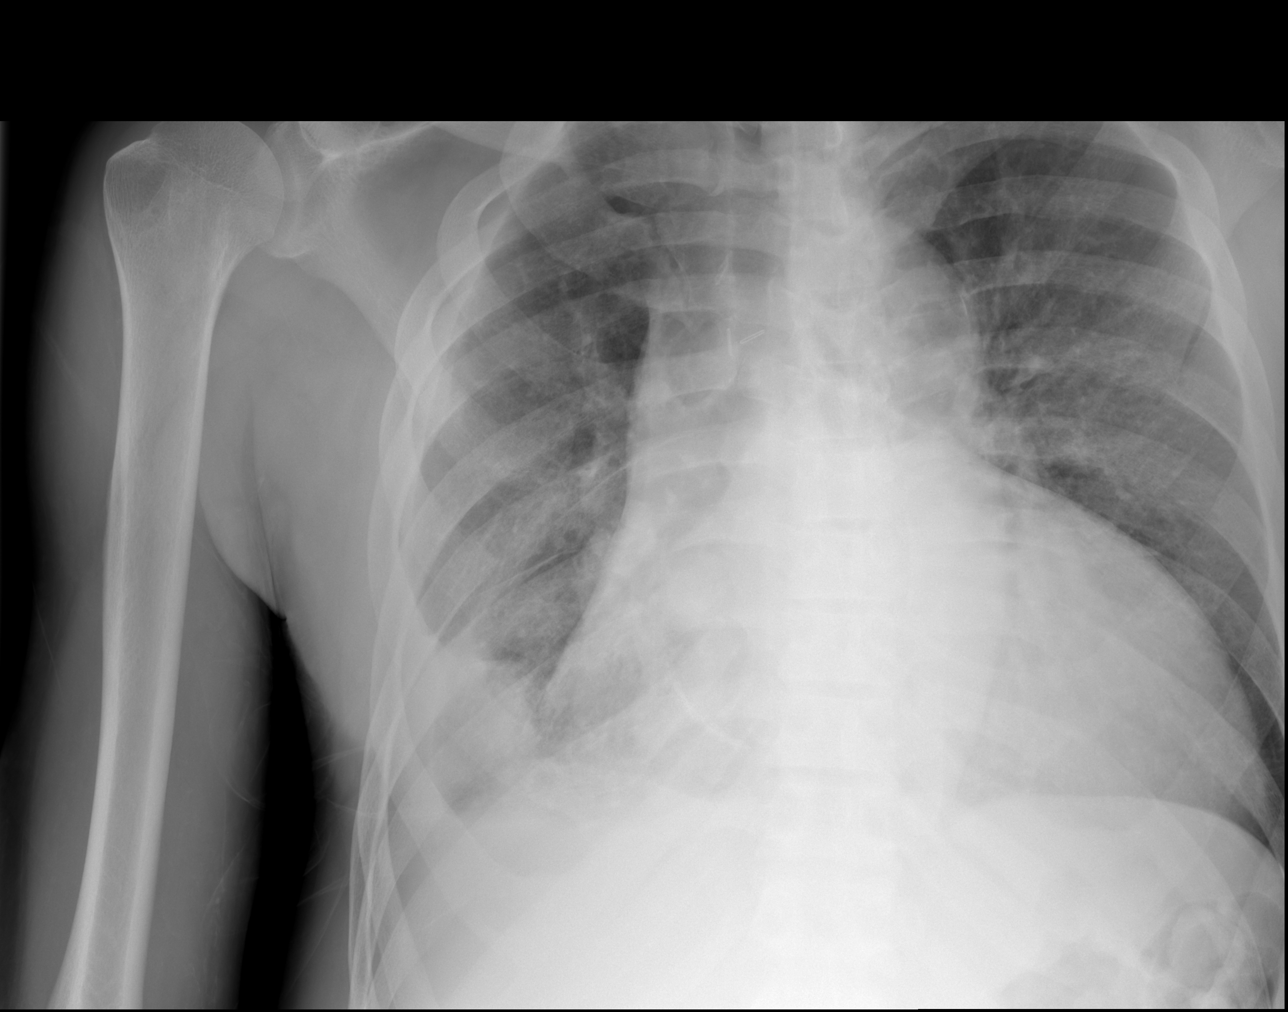

[3 of 3 positions shown; findings below may reference images not displayed]

FINDINGS: AP SEMI-ERECT and LATERAL images were obtained. Cardiac silhouette
markedly enlarged, unchanged. Thoracic aorta mildly tortuous and
atherosclerotic, unchanged. Hilar and mediastinal contours otherwise
unremarkable.

Mild pulmonary venous hypertension without overt pulmonary edema
currently. Moderate-sized RIGHT pleural effusion unchanged over
several months. Associated passive atelectasis in the RIGHT LOWER
LOBE. Lungs remain clear otherwise. No LEFT pleural effusion. Mild
degenerative changes involving the thoracic spine.
IMPRESSION: 1. Moderate-sized RIGHT pleural effusion unchanged over several
months. Associated passive atelectasis in the RIGHT LOWER LOBE. No
acute cardiopulmonary disease otherwise.
2. Stable cardiomegaly. Pulmonary venous hypertension without overt
pulmonary edema.

## 2021-08-08 IMAGING — CR LEFT FOOT - COMPLETE 3+ VIEW
1 series · 1 of 1 positions shown · non-contrast
Comparison: None.

CLINICAL DATA: Left second toe pain

EXAM:
LEFT FOOT - COMPLETE 3+ VIEW

[foot obl]
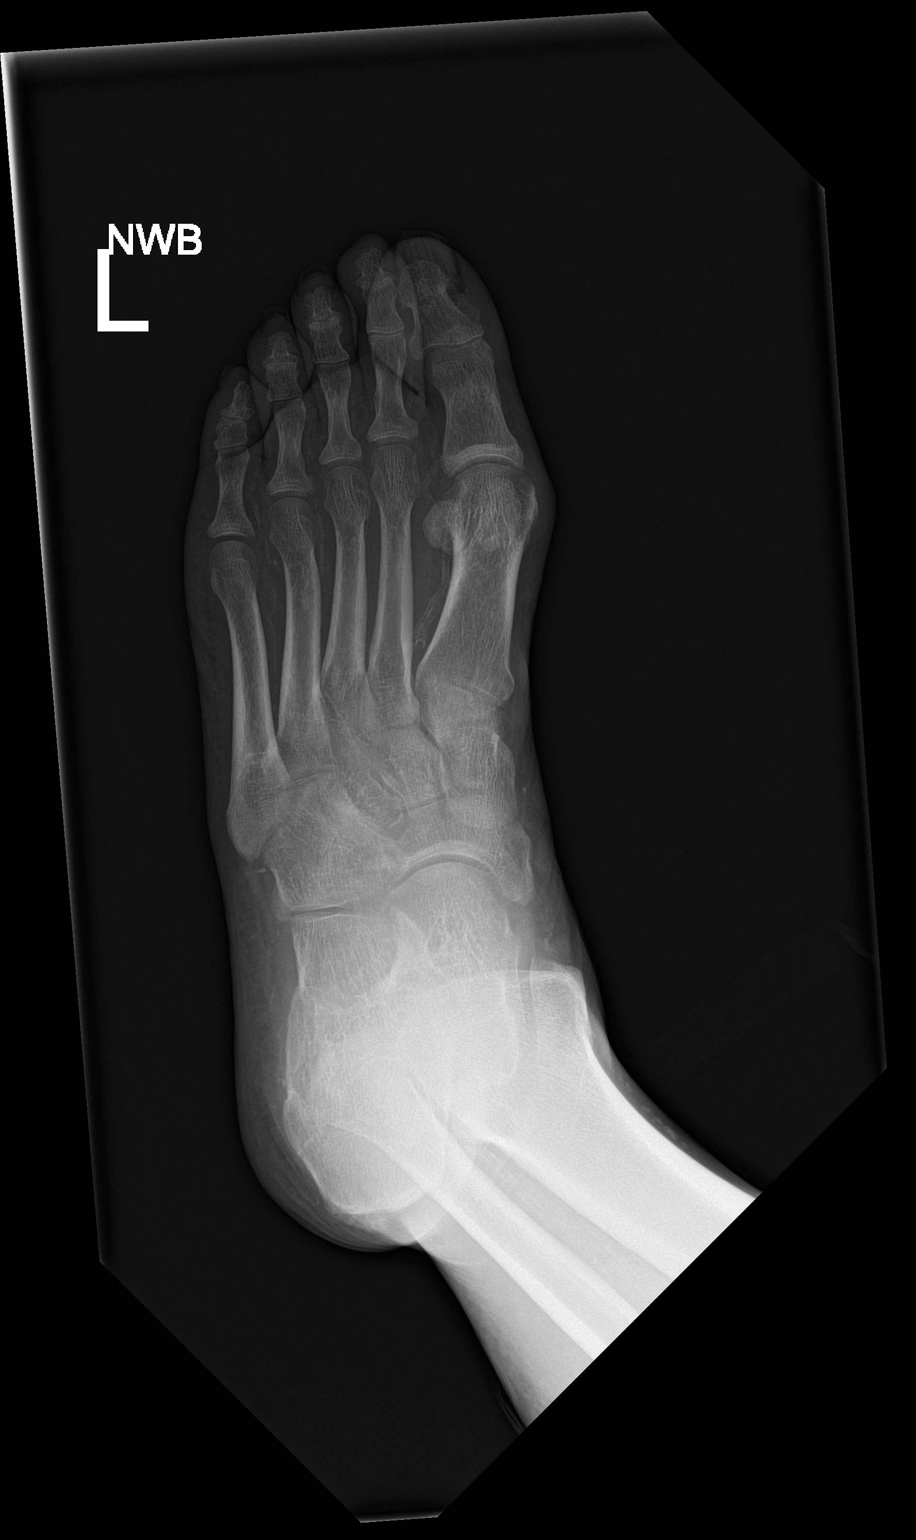

[1 of 1 positions shown; findings below may reference images not displayed]

FINDINGS: No fracture or malalignment.  Vascular calcifications.
IMPRESSION: No acute osseous abnormality

## 2021-08-11 IMAGING — CR DG CHEST 2V
2 series · 2 of 2 positions shown · non-contrast
Comparison: Radiograph March 19, 2019

CLINICAL DATA: Pain and nasal congestion.

EXAM:
CHEST - 2 VIEW

[w chest lat]
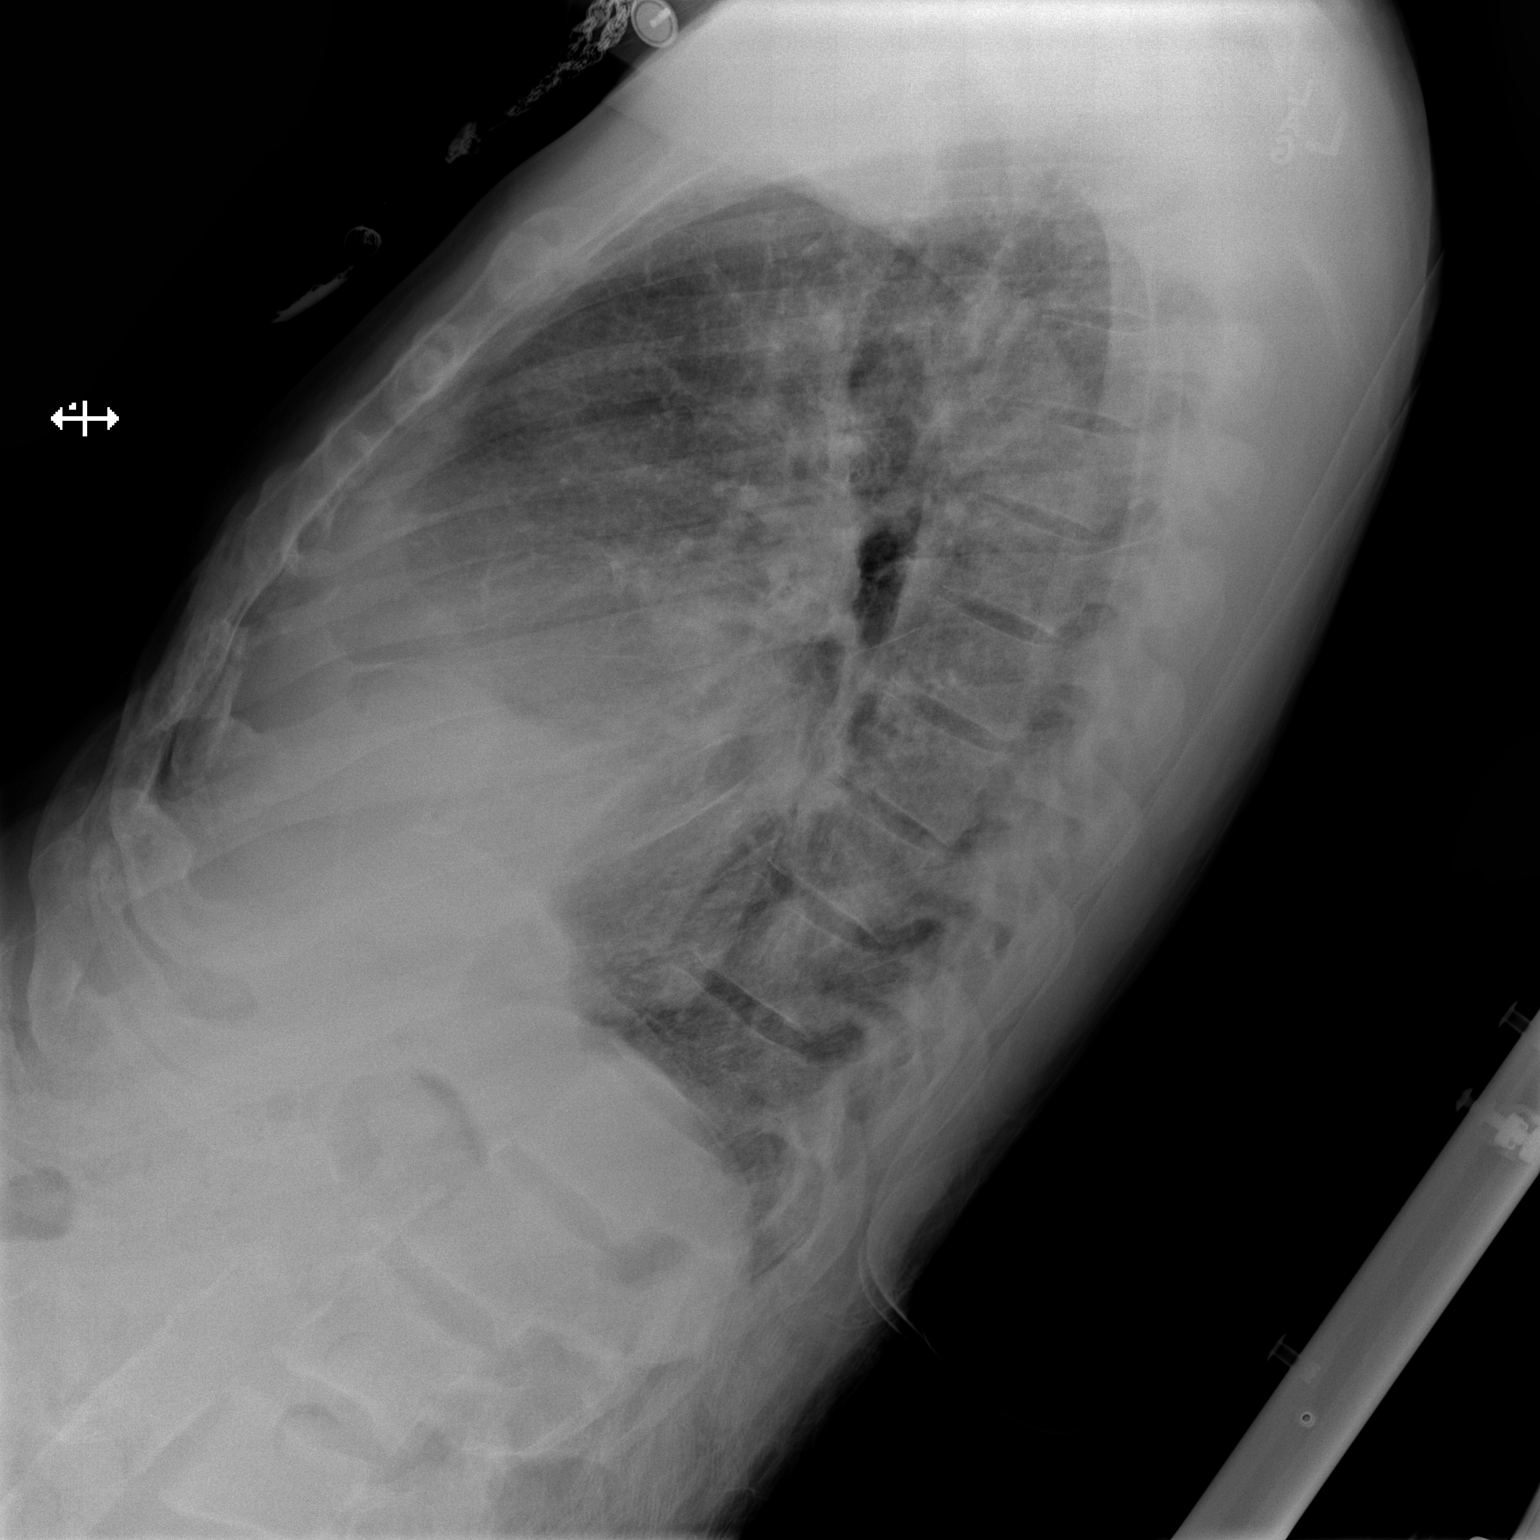

[x chest ap]
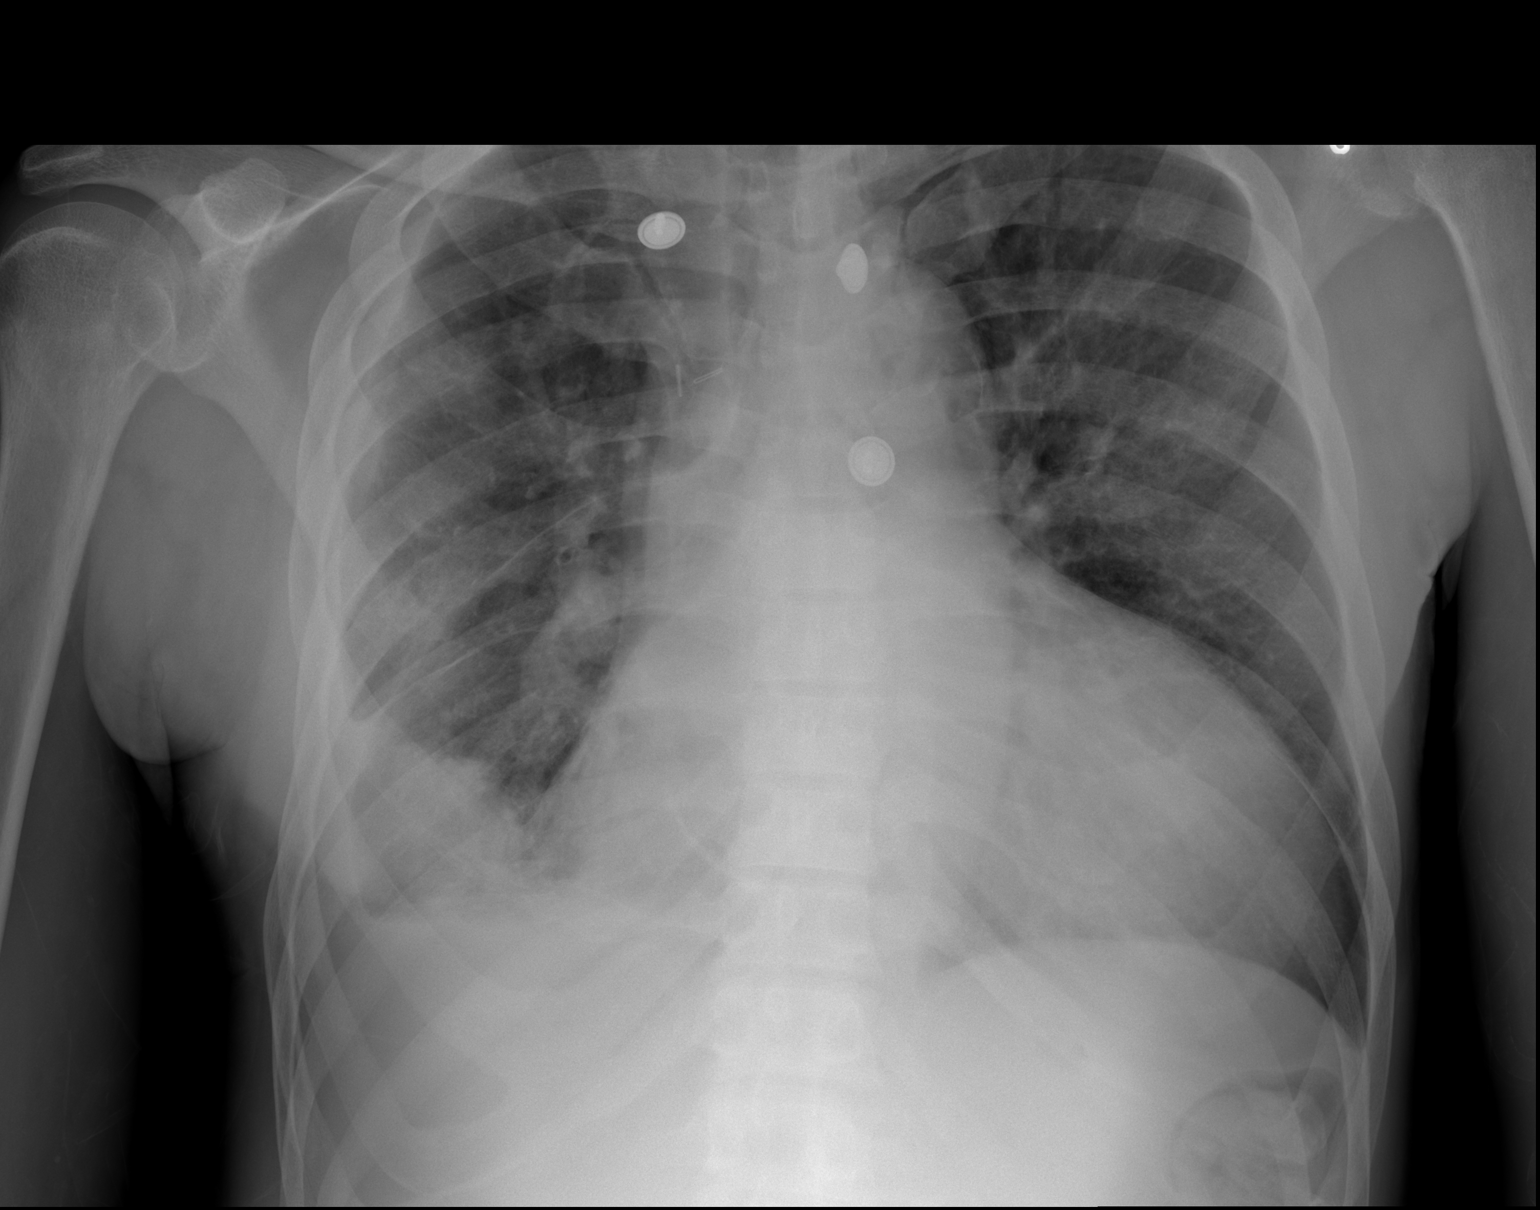

[2 of 2 positions shown; findings below may reference images not displayed]

FINDINGS: Stable right pleural effusion tracking into the fissure with
adjacent areas of passive atelectasis. There are increasing areas of
patchy airspace opacity in perihilar and suprahilar lungs. No
pneumothorax. Cardiomediastinal contours are unchanged from prior.
Stable mediastinal clips. No acute osseous or soft tissue
abnormality.
IMPRESSION: Right pleural effusion with adjacent passive atelectasis.

Findings suggest interstitial edema with more focal airspace opacity
likely reflecting developing alveolar edema versus early infection.

## 2021-08-17 IMAGING — CR DG FOREARM 2V*R*
2 series · 2 of 2 positions shown · non-contrast
Comparison: None.

CLINICAL DATA: Wound infection

EXAM:
RIGHT FOREARM - 2 VIEW

[x forearm ap right]
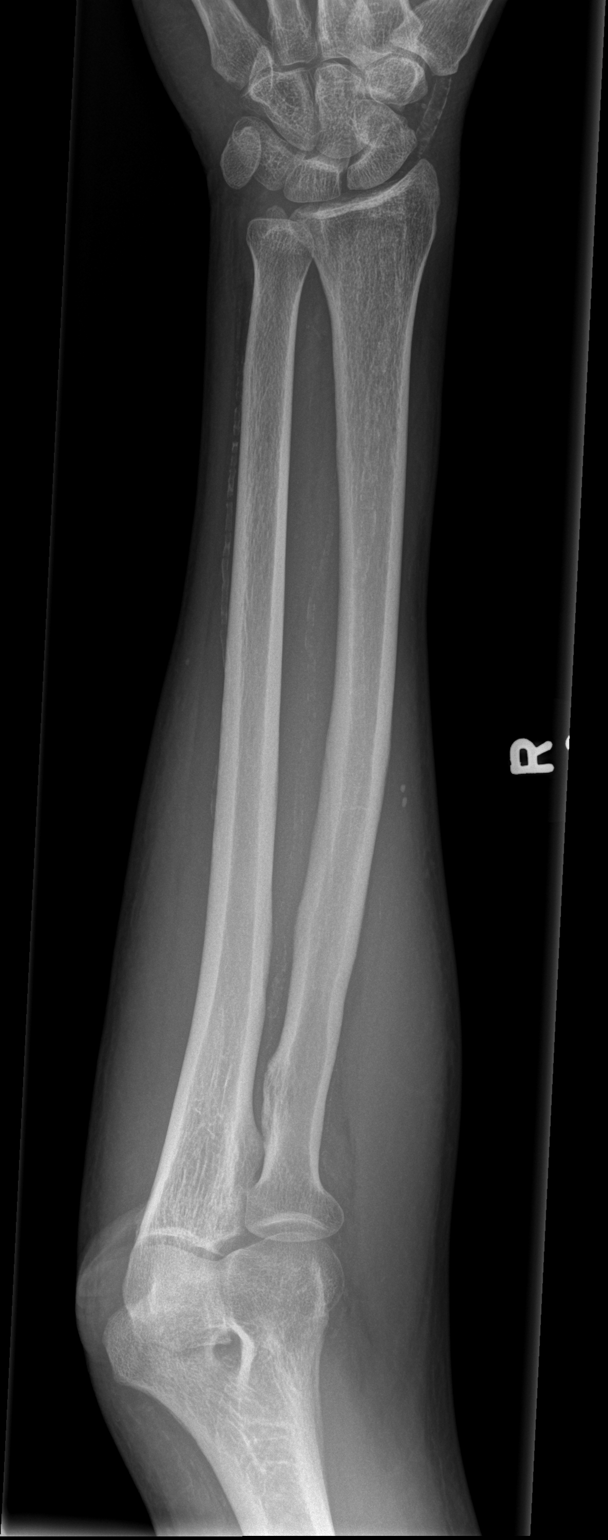

[x forearm lat right]
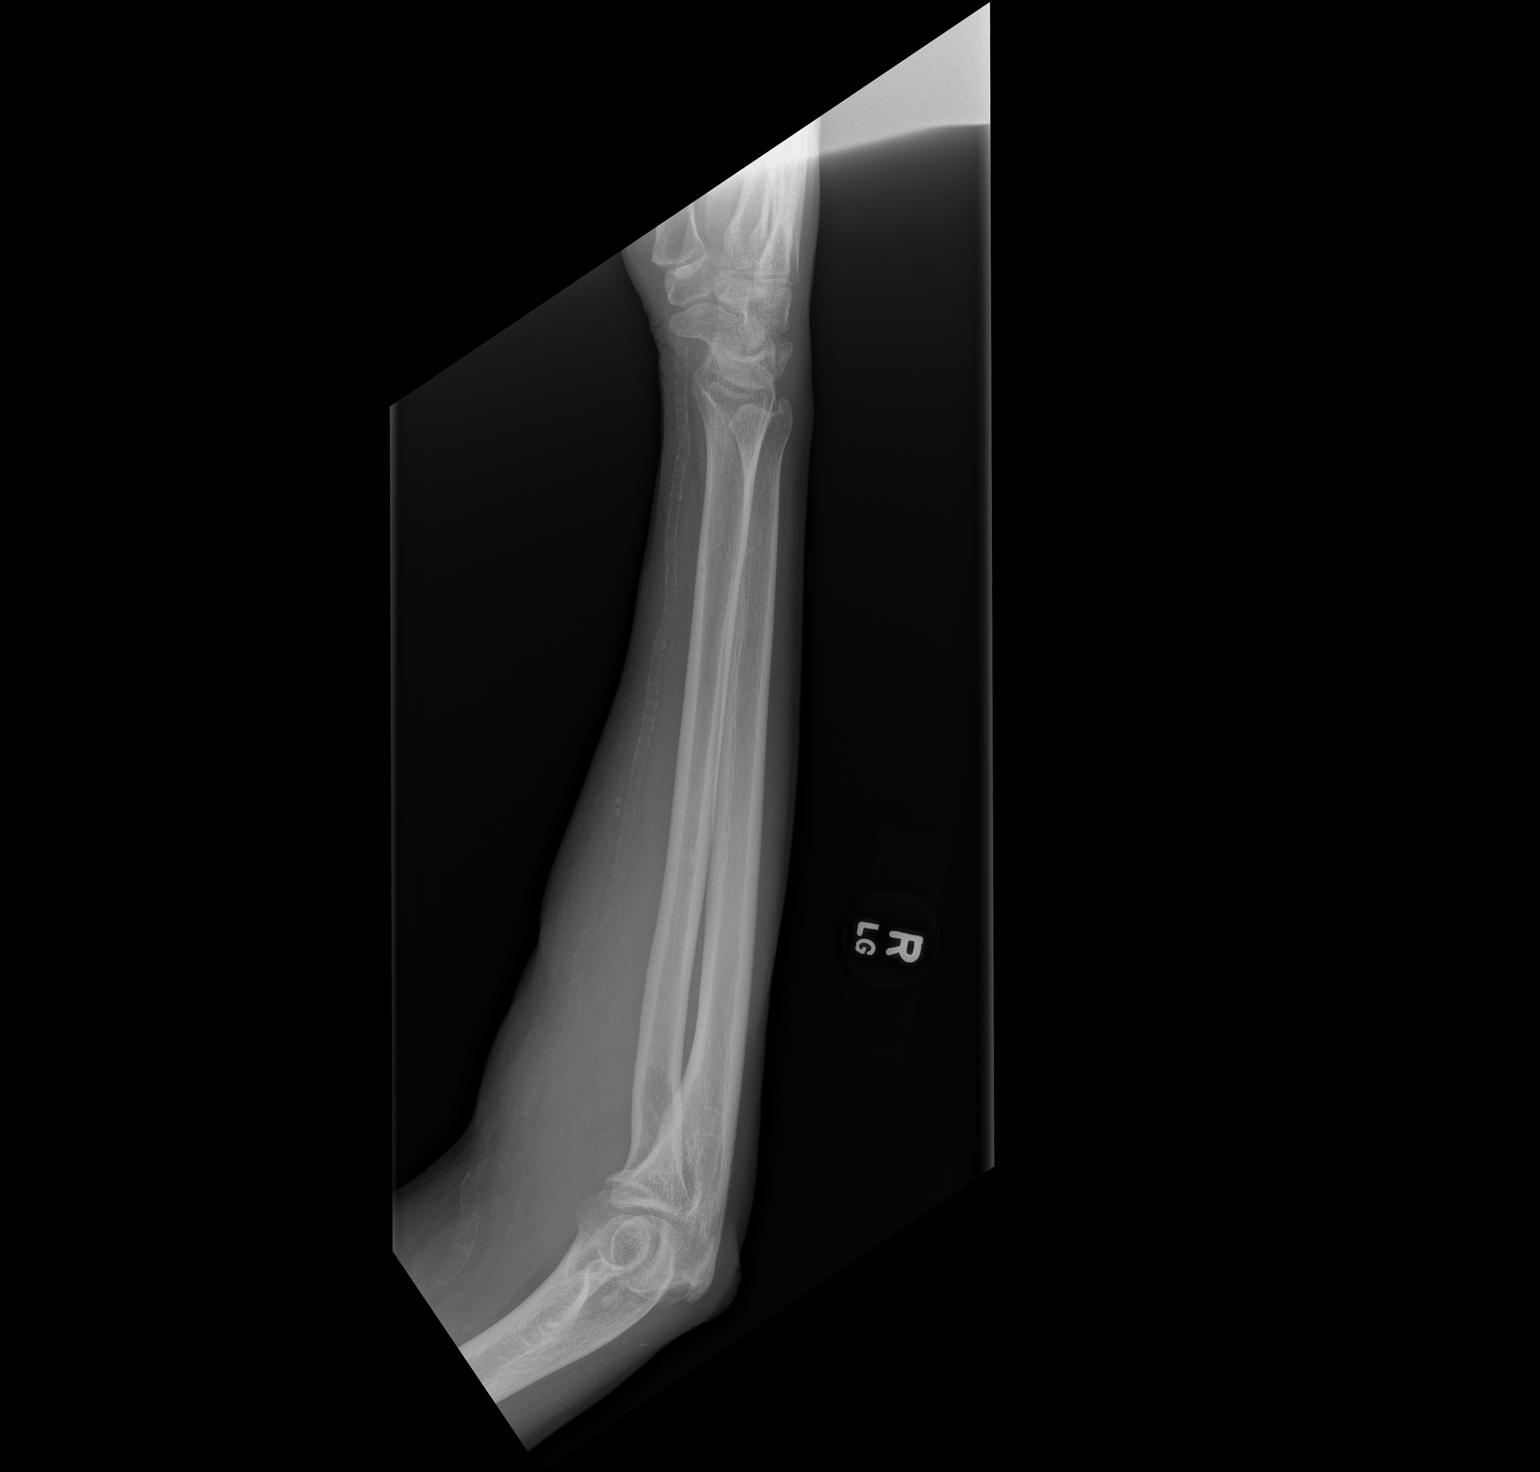

[2 of 2 positions shown; findings below may reference images not displayed]

FINDINGS: No fracture or dislocation. No bony destruction or periosteal
reaction. Diffuse soft tissue swelling seen around the proximal
forearm. Scattered vascular calcifications.
IMPRESSION: No acute osseous abnormality. Diffuse soft tissue swelling seen
around the proximal forearm.

## 2021-08-31 IMAGING — DX DG CHEST 1V PORT
1 series · 2 of 2 positions shown · non-contrast
Comparison: 03/23/2019, 03/19/2019, CT chest 08/28/2018

CLINICAL DATA: Shortness of breath

EXAM:
PORTABLE CHEST 1 VIEW

[Series 1: chest · 0.14mm/px · 2 of 2 slices shown]
[im 1/2]
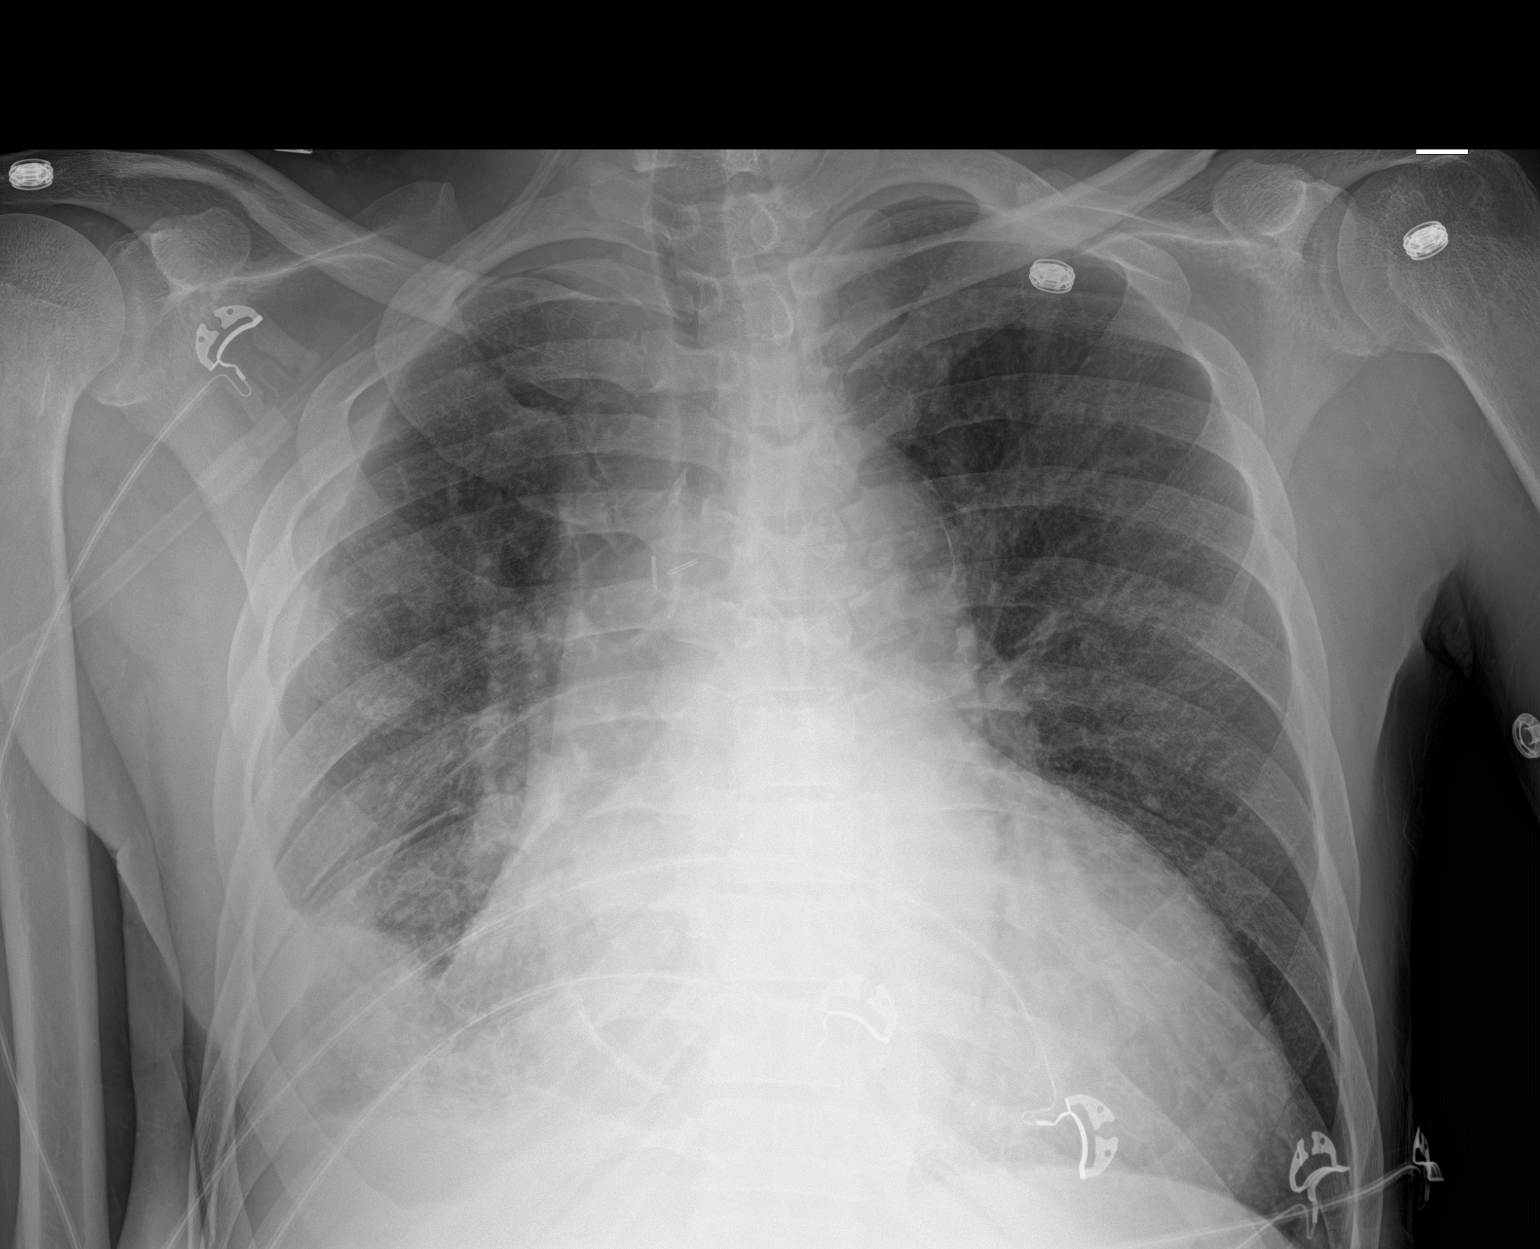
[im 2/2]
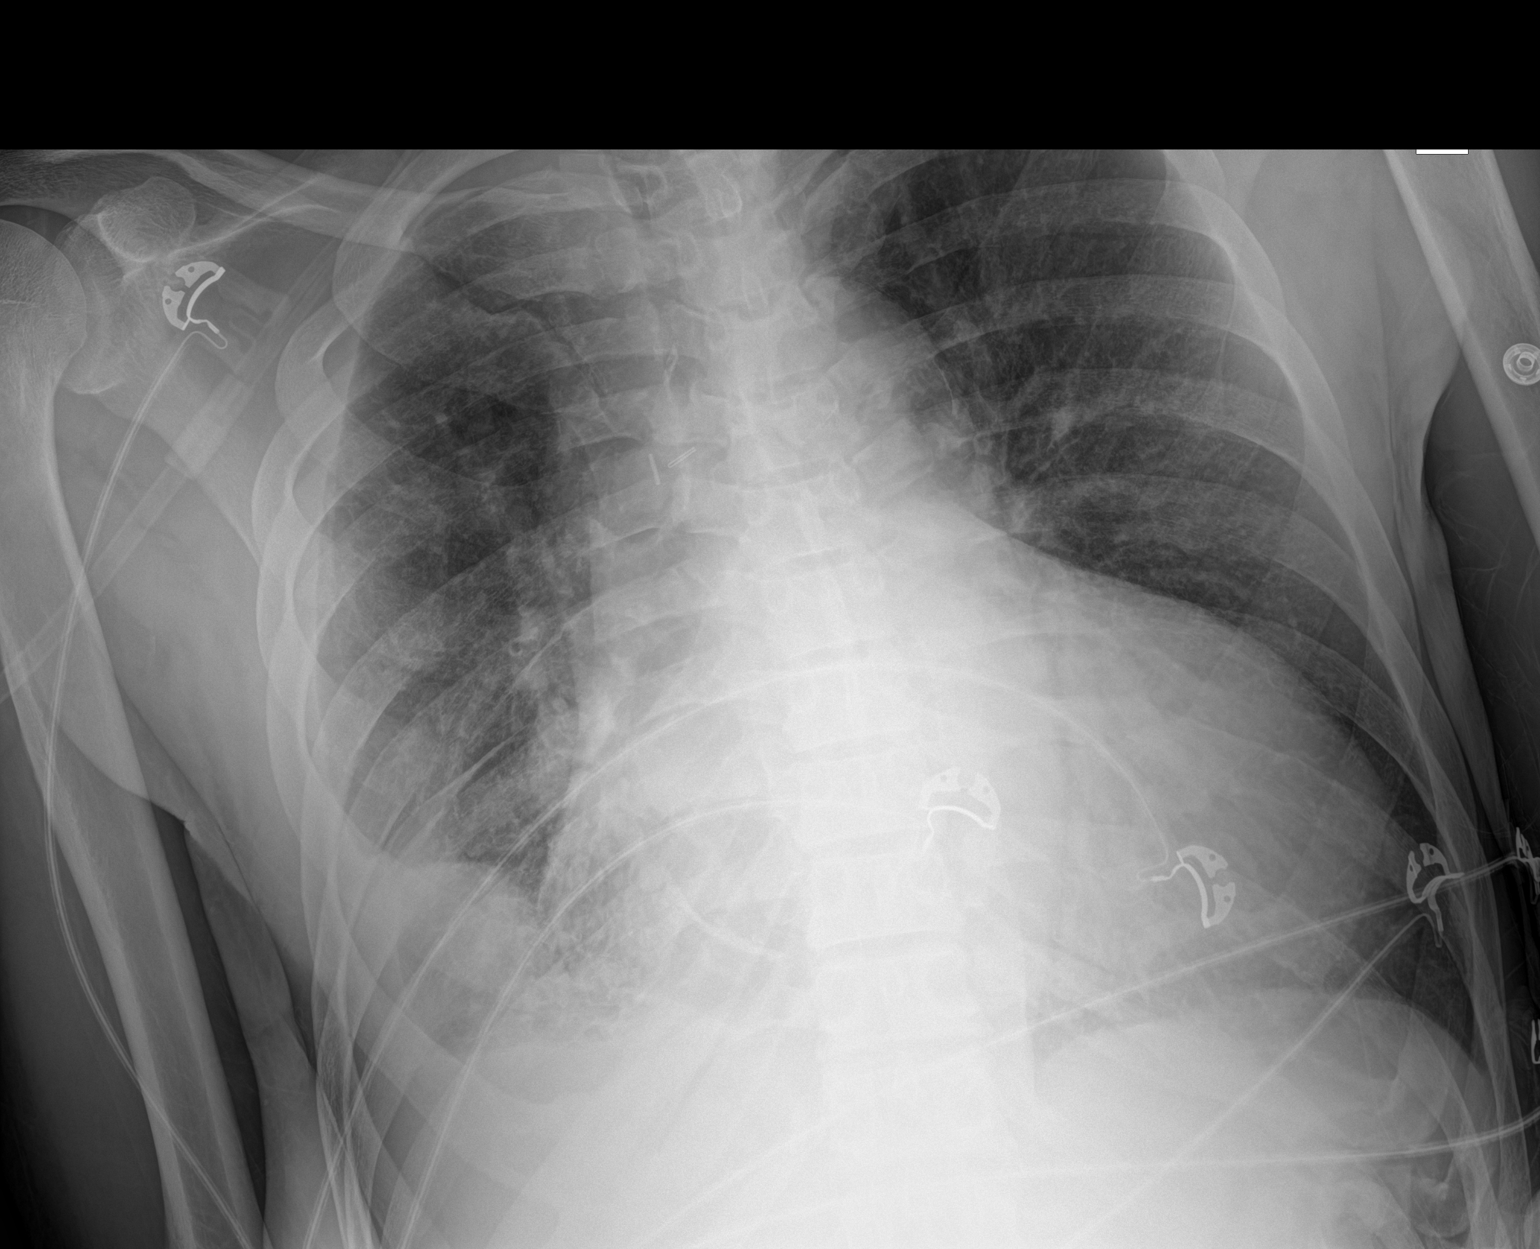

[2 of 2 positions shown; findings below may reference images not displayed]

FINDINGS: Moderate cardiomegaly with central vascular congestion and mild
interstitial pulmonary edema. Moderate right pleural effusion with
loculated appearance at the right base. Similar airspace disease at
the right base. Convex contour. No pneumothorax.
IMPRESSION: 1. Cardiomegaly with vascular congestion and mild diffuse
interstitial pulmonary edema
2. No significant change in loculated appearing moderate right
pleural effusion with persistent airspace disease and possible mass
at the right lung base.

## 2021-09-05 IMAGING — CR DG CHEST 2V
2 series · 2 of 2 positions shown · non-contrast
Comparison: Most recent comparison 5 days ago 04/12/2019

CLINICAL DATA: Chest pain.

EXAM:
CHEST - 2 VIEW

[w chest pa]
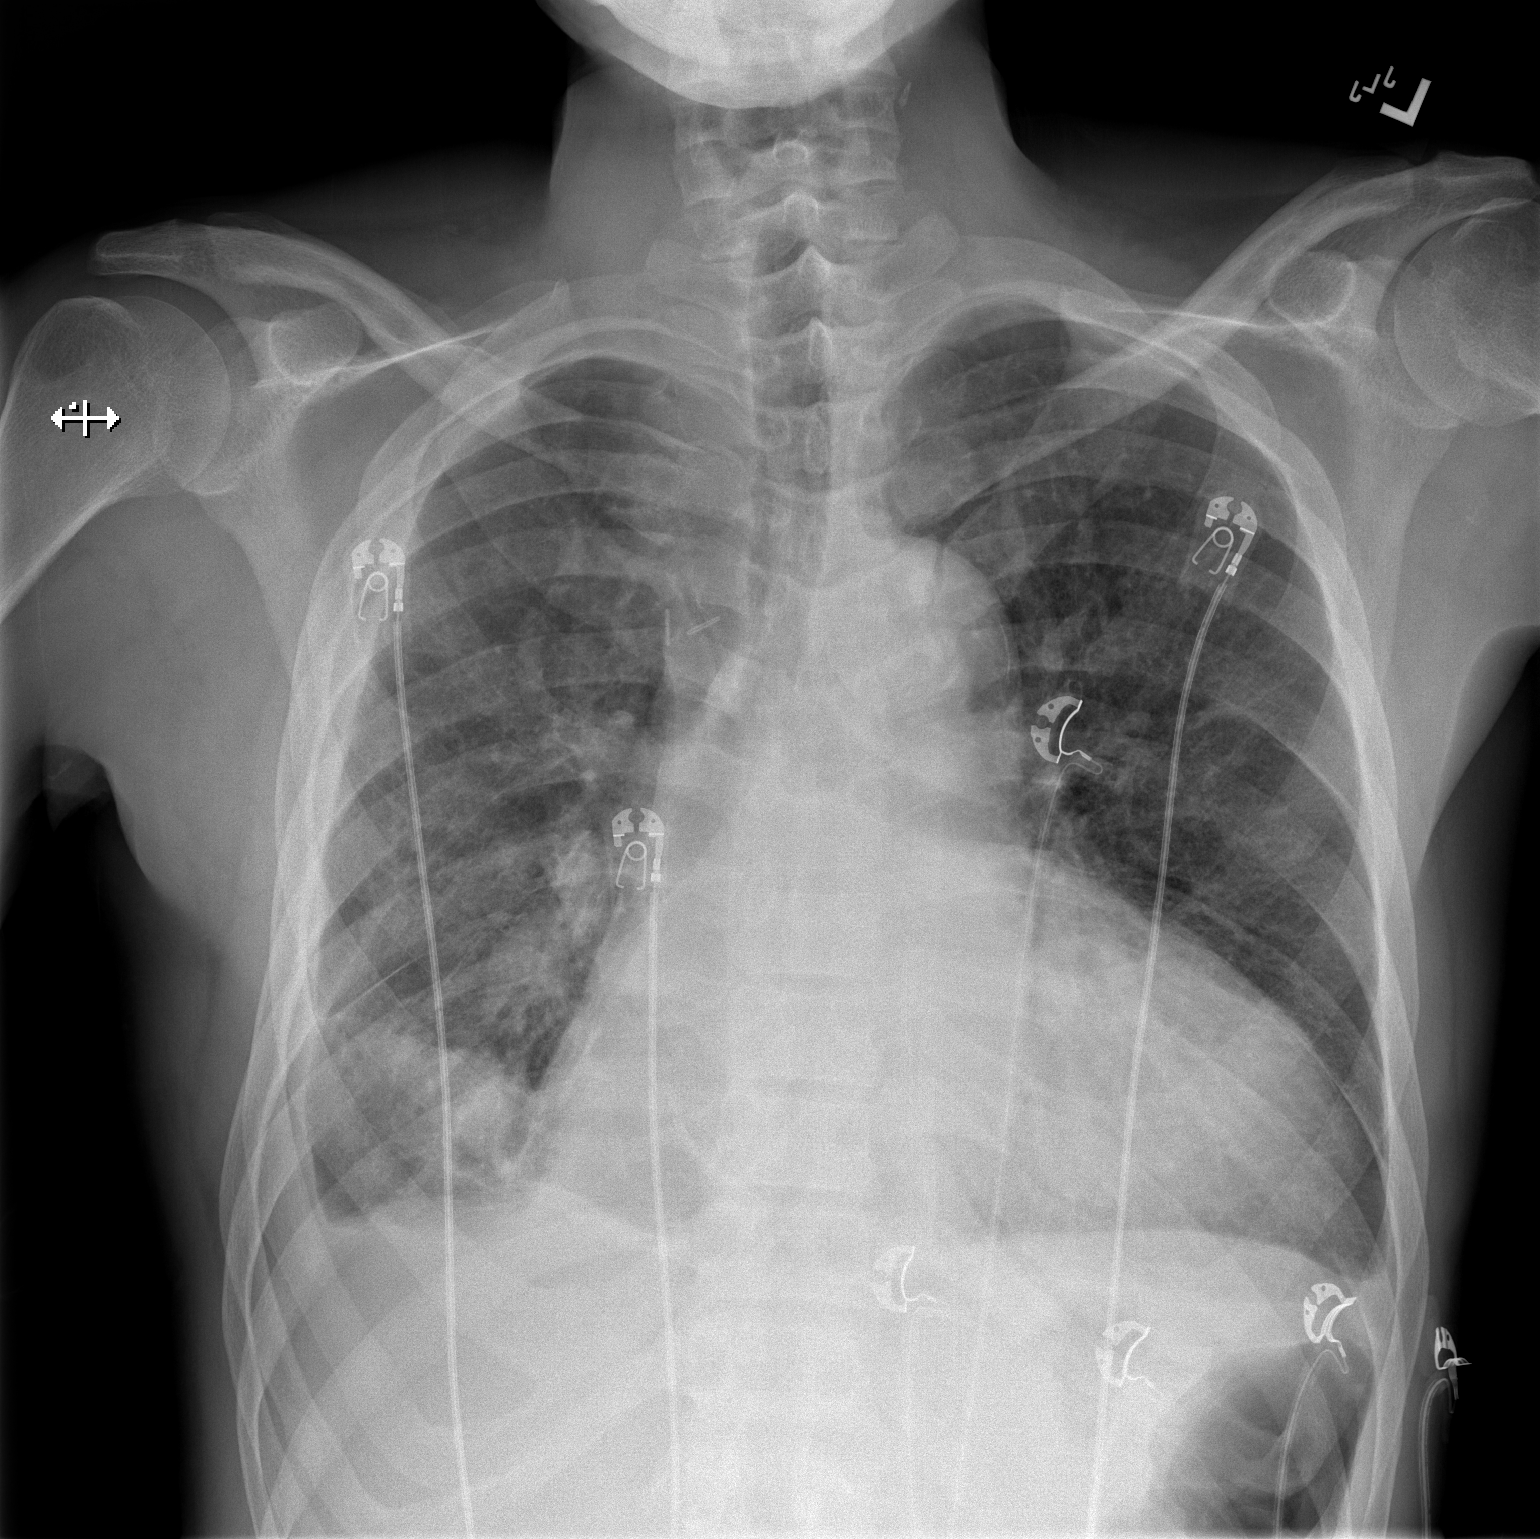

[w chest lat]
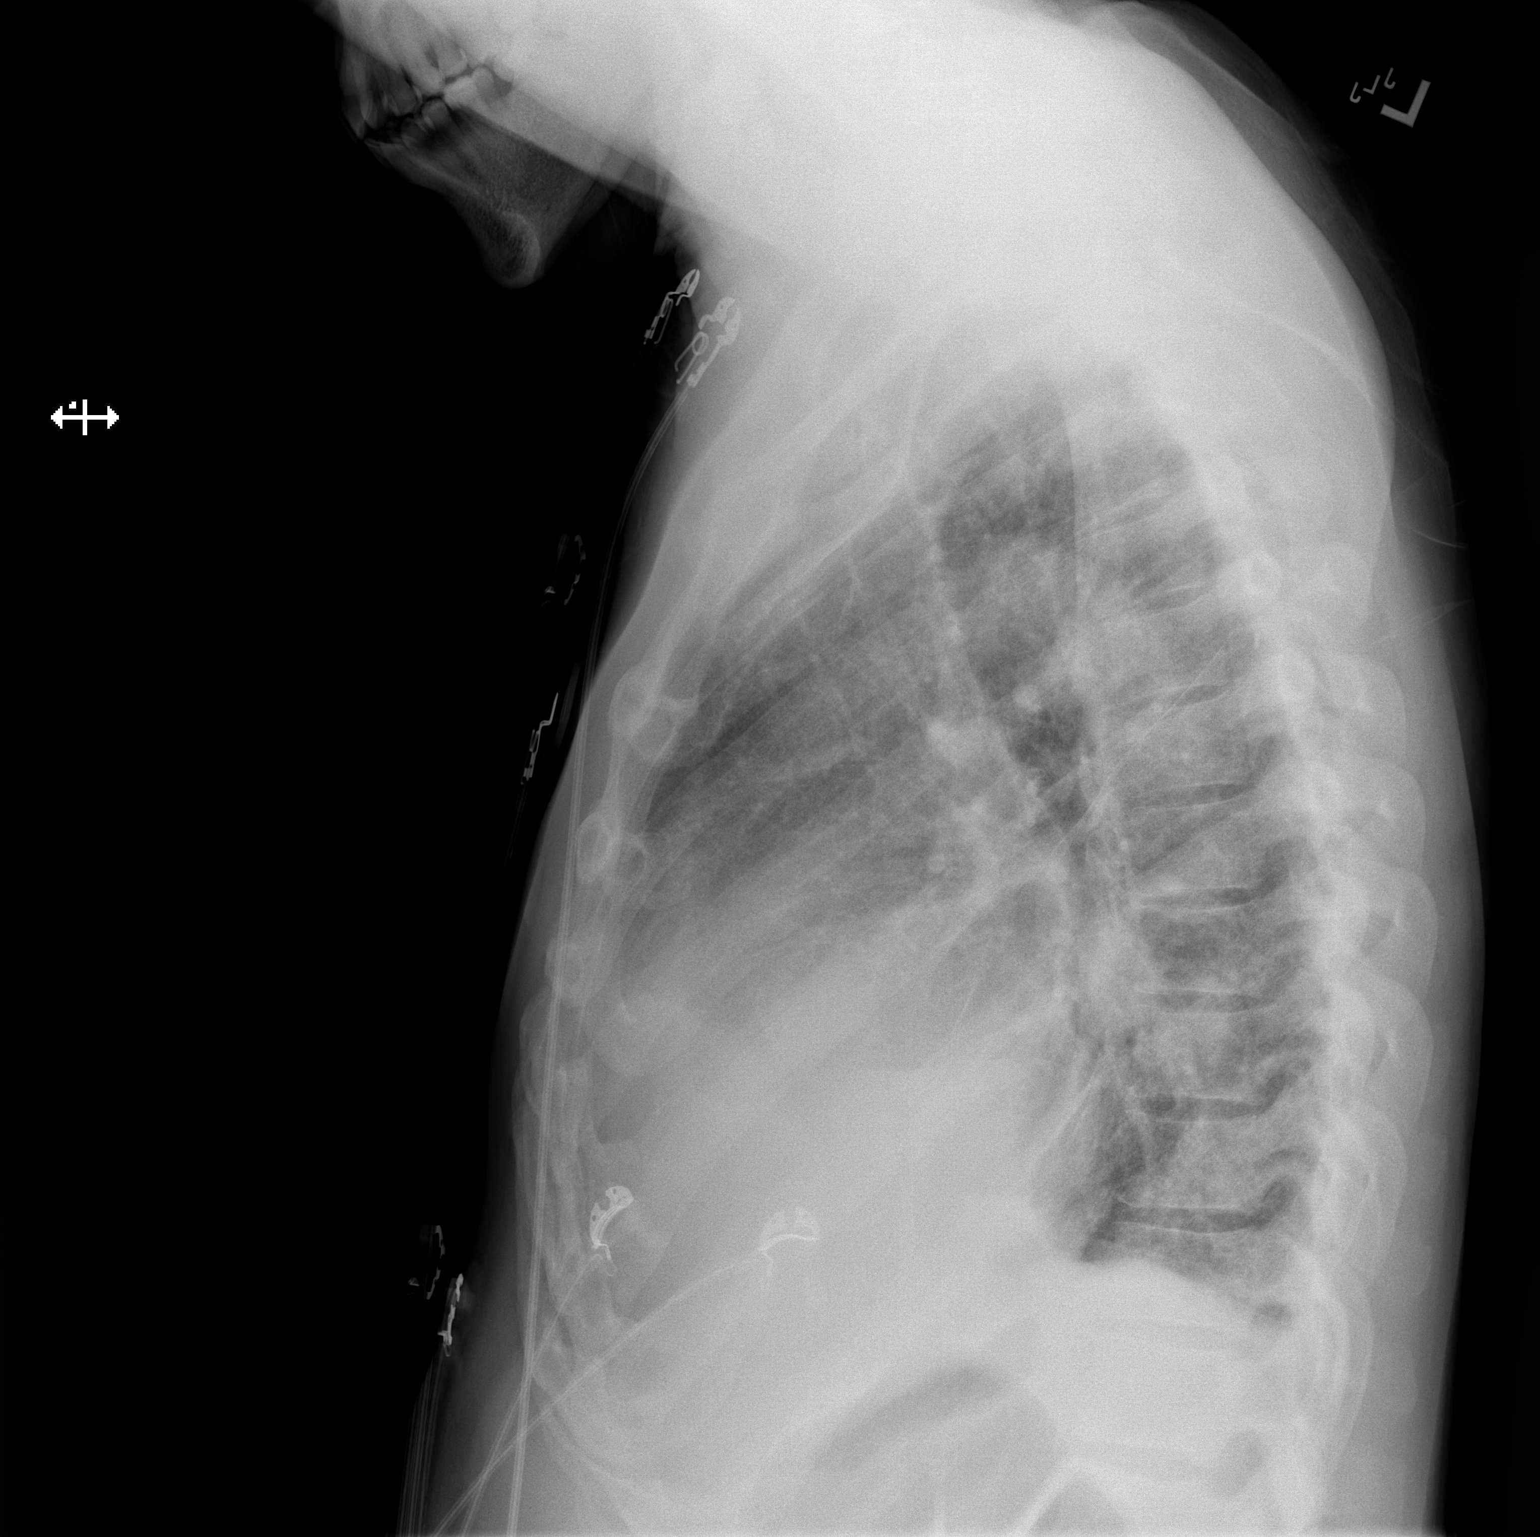

[2 of 2 positions shown; findings below may reference images not displayed]

FINDINGS: Chronic cardiomegaly, unchanged. Unchanged mediastinal contours.
Chronic partially loculated right pleural effusion with associated
basilar opacity. Vascular congestion and suggestion of mild
pulmonary edema, also unchanged. Trace left pleural effusion. No new
airspace disease or pneumothorax. Unchanged osseous structures.
IMPRESSION: 1. Unchanged radiographic appearance of the chest over the past 5
days.
2. Chronic cardiomegaly with pulmonary edema. Bilateral pleural
effusions, partially loculated on the right with associated basilar
opacity.

## 2021-09-08 IMAGING — CT CT ABD-PELV W/O CM
2 of 4 series · 16 of 46 positions shown, 18 images · non-contrast
Comparison: 06/22/2018 CT

CLINICAL DATA: 53-year-old male with acute abdominal pain, nausea
and vomiting. Patient with end-stage renal disease on dialysis.

EXAM:
CT ABDOMEN AND PELVIS WITHOUT CONTRAST
TECHNIQUE: Multidetector CT imaging of the abdomen and pelvis was performed
following the standard protocol without IV contrast.

[Series 3: ap without · axial · non-contrast · 0.72mm/px · z∈[-106,+319]mm · 13 of 97 slices shown, 15 images]
[im 6/97  soft-tissue]
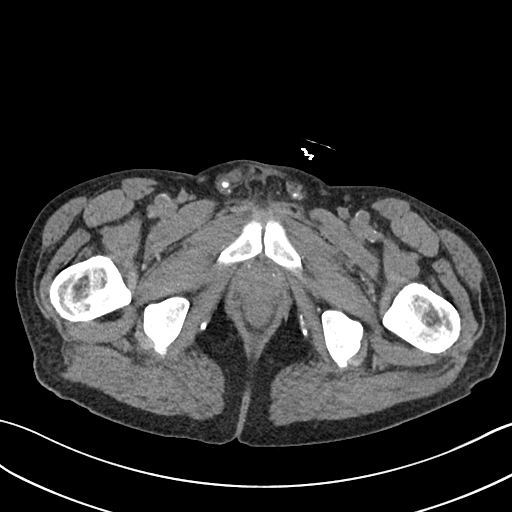
[im 6/97  bone]
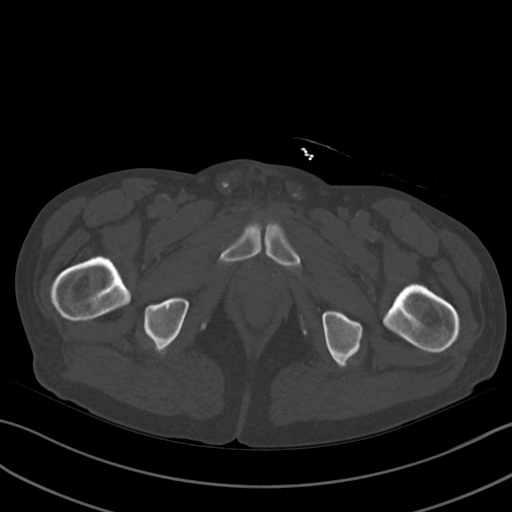
[im 11/97  soft-tissue]
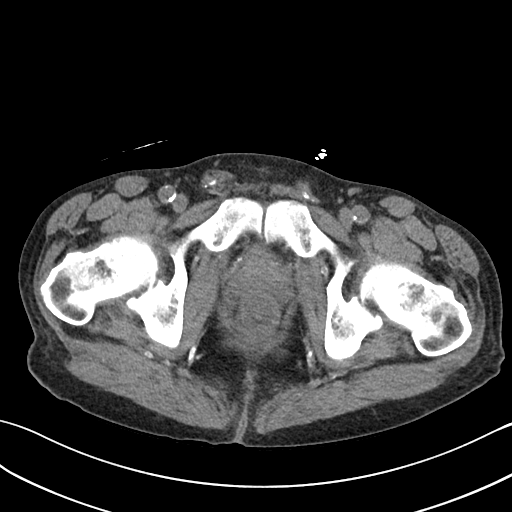
[im 22/97  soft-tissue]
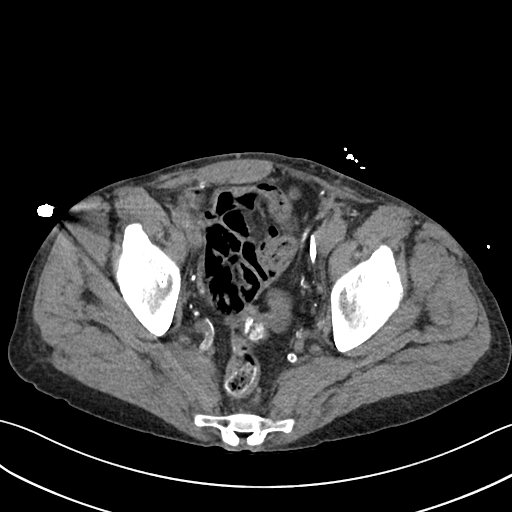
[im 27/97  soft-tissue]
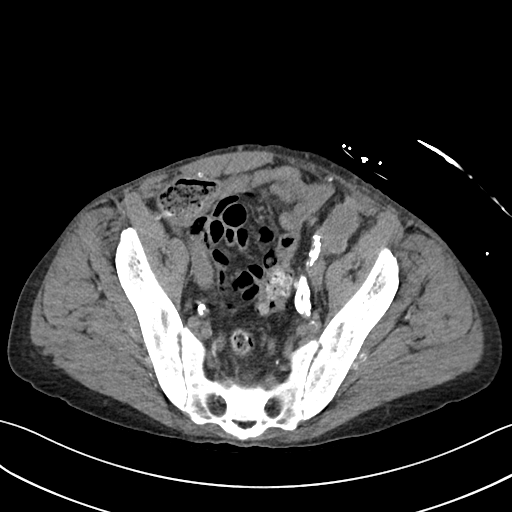
[im 33/97  soft-tissue]
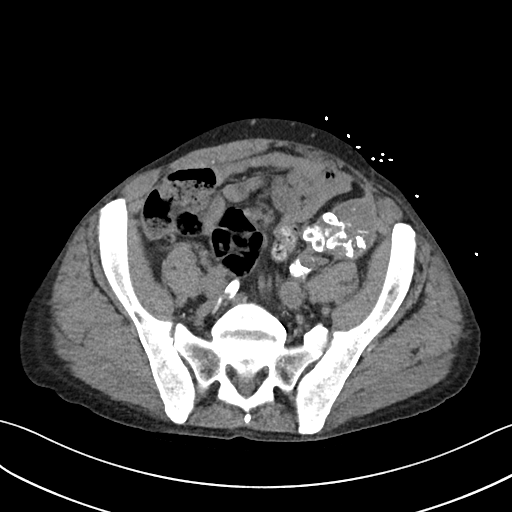
[im 43/97  soft-tissue]
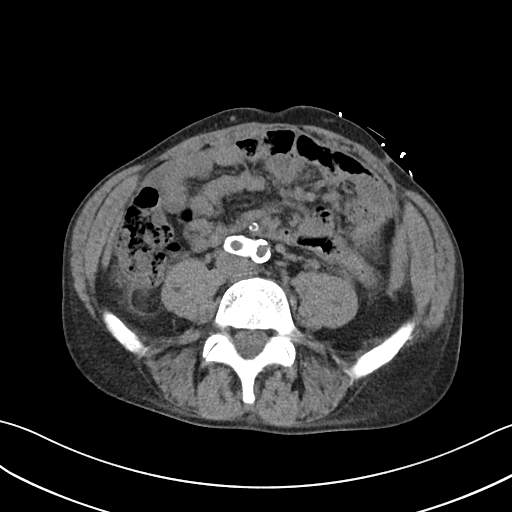
[im 49/97  soft-tissue]
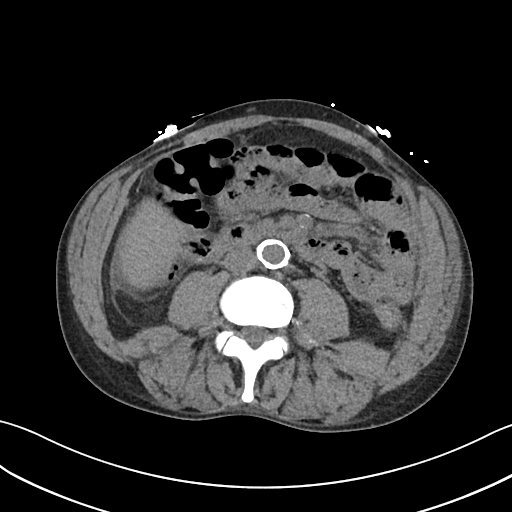
[im 54/97  soft-tissue]
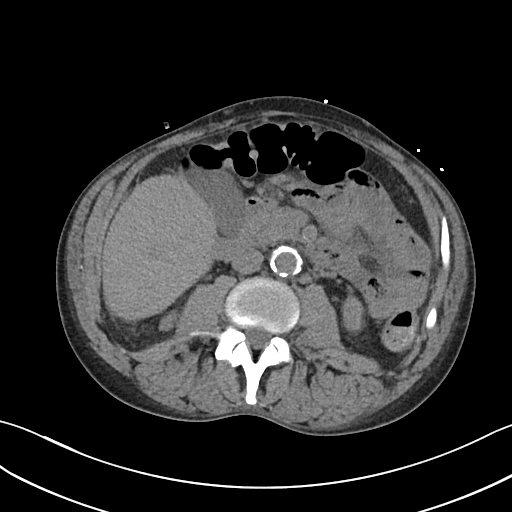
[im 65/97  soft-tissue]
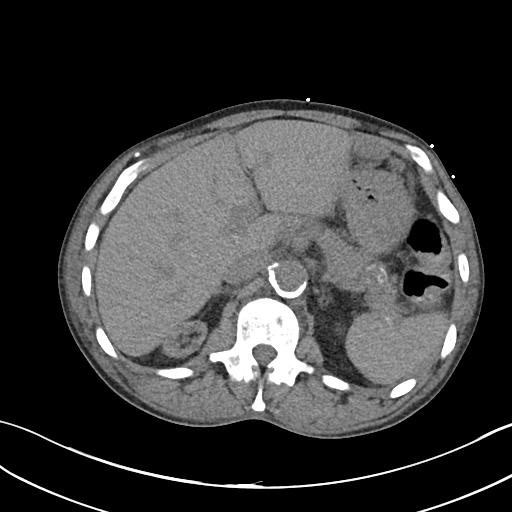
[im 65/97  bone]
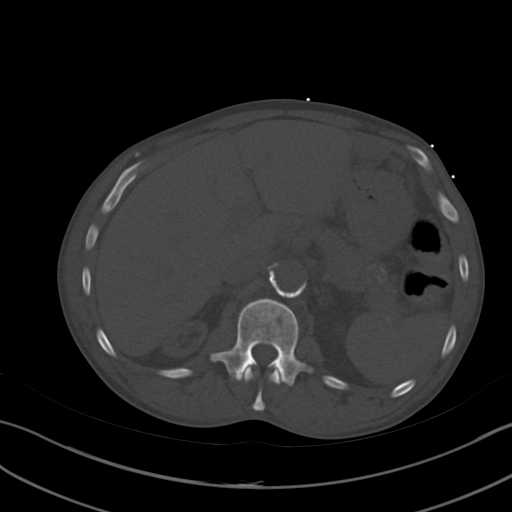
[im 70/97  soft-tissue]
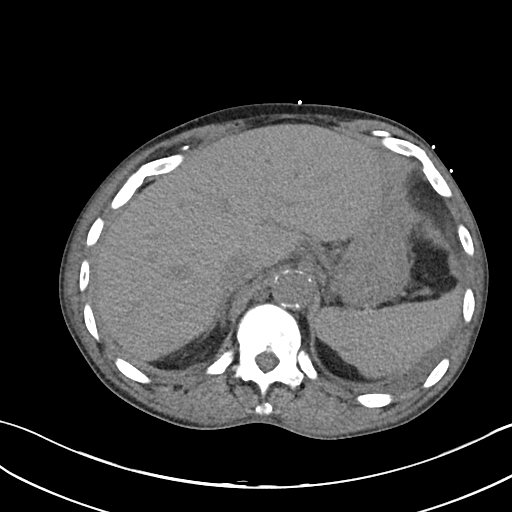
[im 75/97  soft-tissue]
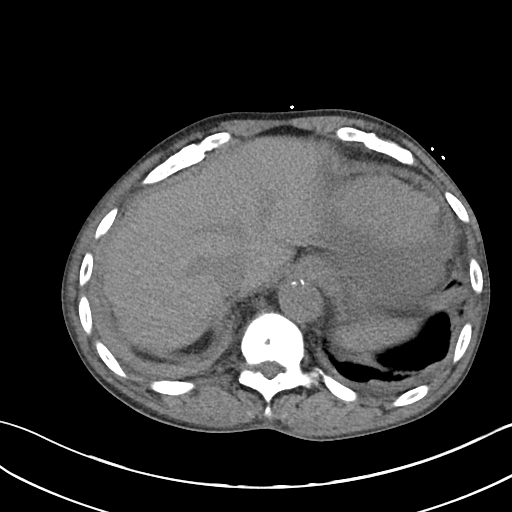
[im 86/97  soft-tissue]
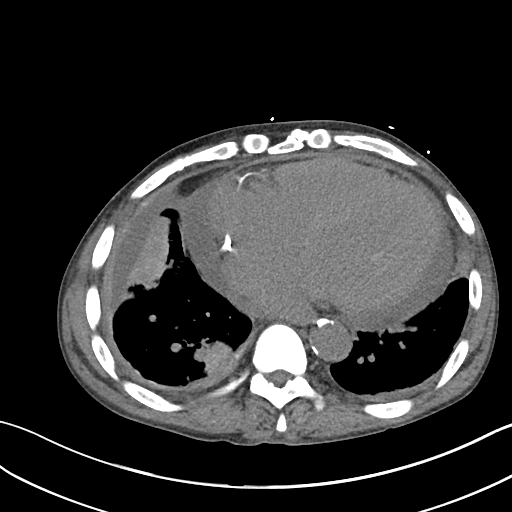
[im 91/97  soft-tissue]
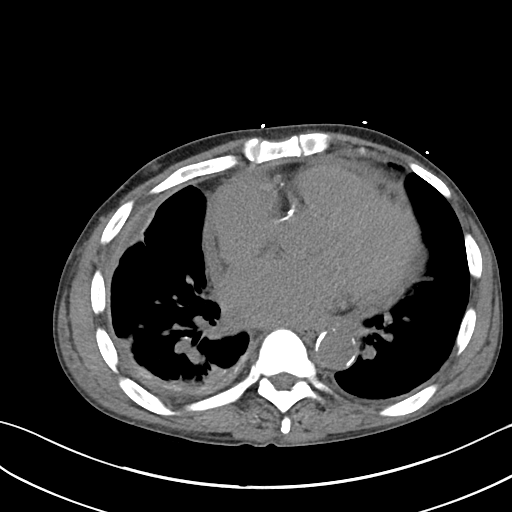

[Series 6: cor · coronal · 0.78mm/px · 3 of 94 slices shown]
[im 32/94  soft-tissue]
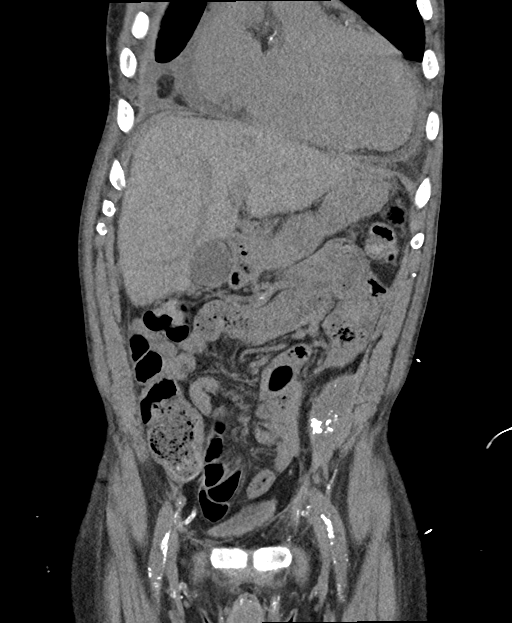
[im 42/94  soft-tissue]
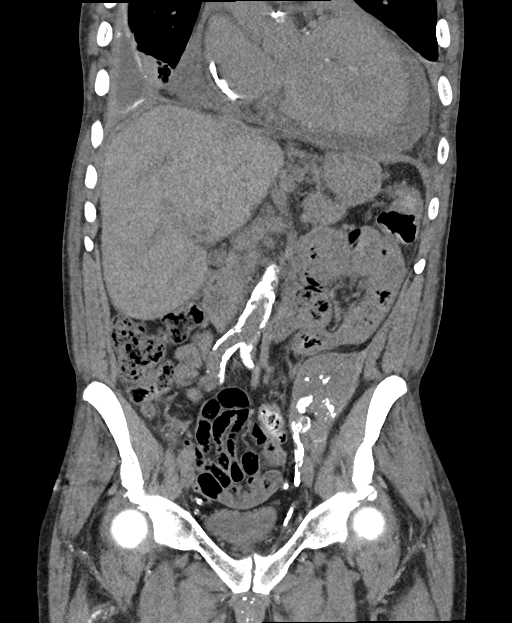
[im 52/94  soft-tissue]
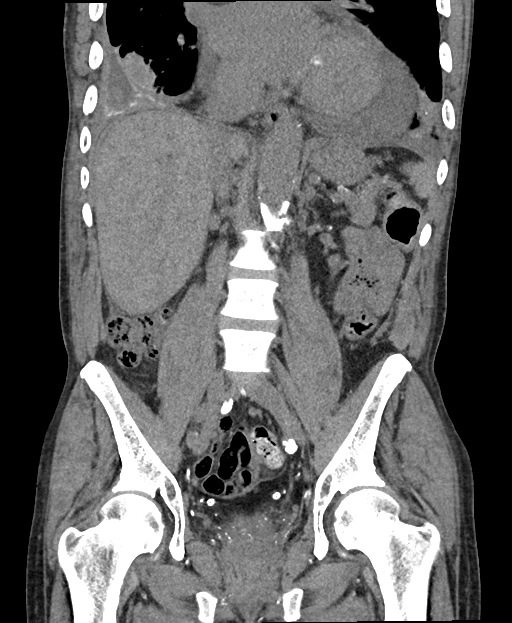

[16 of 46 positions shown; findings below may reference images not displayed]

FINDINGS: Please note that parenchymal abnormalities may be missed without
intravenous contrast.

Lower chest: Moderate to marked cardiomegaly and small to moderate
pericardial effusion again noted. Heavy coronary artery and aortic
atherosclerotic calcifications noted. Small loculated RIGHT pleural
effusion with moderate bibasilar atelectasis noted.

Hepatobiliary: The liver and gallbladder are unremarkable. No
biliary dilatation.

Pancreas: Unremarkable

Spleen: Unremarkable

Adrenals/Urinary Tract: Atrophic kidneys again noted. A 2.7 x 3 cm
slightly hyperdense mass within the LEFT kidney is unchanged from
the prior study and decreased in size since 5304. A dystrophic
failed LEFT pelvic renal transplant again noted. The adrenal glands
and visualized bladder are unremarkable.

Stomach/Bowel: Stomach is within normal limits. No evidence of bowel
wall thickening, distention, or inflammatory changes.

Vascular/Lymphatic: Heavy aortic atherosclerosis and other vascular
calcifications identified. No enlarged abdominal or pelvic lymph
nodes.

Reproductive: Prostate is unremarkable.

Other: No ascites, focal collection or pneumoperitoneum.

Musculoskeletal: No acute or suspicious bony abnormalities
identified.
IMPRESSION: 1. No evidence of acute abnormality.
2. Moderate to marked cardiomegaly and unchanged small to moderate
pericardial effusion
3. Atrophic kidneys with unchanged LEFT renal lesion.
4. Small loculated RIGHT pleural effusion and moderate bibasilar
atelectasis.
5. Coronary artery and Aortic Atherosclerosis ([id number]).

## 2021-09-24 IMAGING — CT CT ABD-PELV W/O CM
2 of 4 series · 16 of 46 positions shown, 18 images · non-contrast
Comparison: 04/20/2019

CLINICAL DATA: Increased abdominal distention. End-stage renal
disease. Failed kidney transplant. Chronic pancreatitis.

EXAM:
CT ABDOMEN AND PELVIS WITHOUT CONTRAST
TECHNIQUE: Multidetector CT imaging of the abdomen and pelvis was performed
following the standard protocol without IV contrast.

[Series 3: a/p w/o 5mm · axial · non-contrast · 0.90mm/px · z∈[+853,+1353]mm · 13 of 110 slices shown, 15 images]
[im 5/110  soft-tissue]
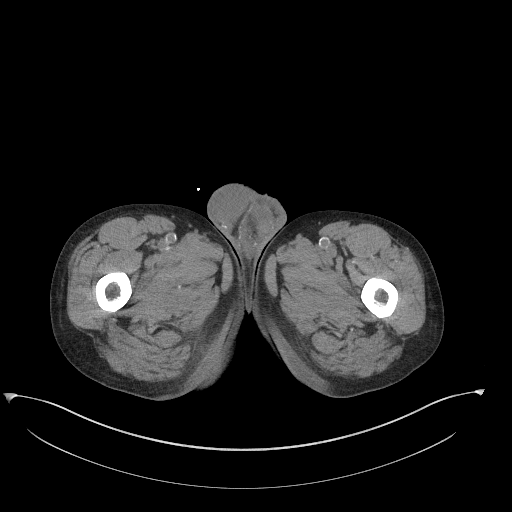
[im 5/110  bone]
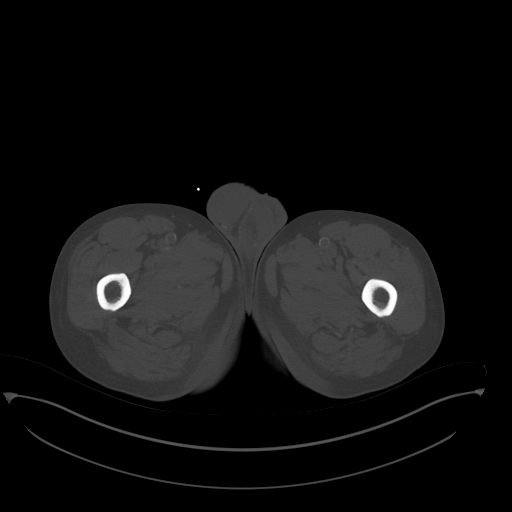
[im 15/110  soft-tissue]
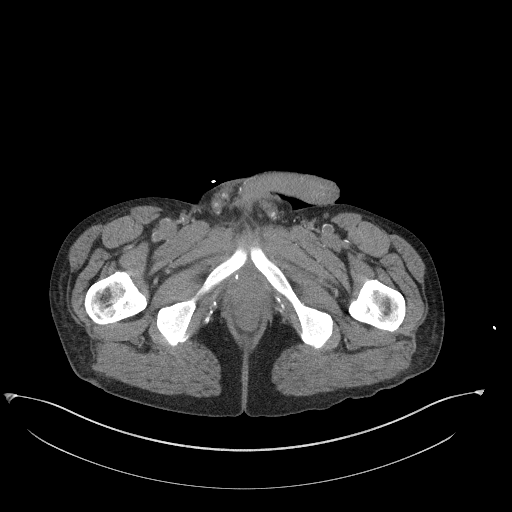
[im 24/110  soft-tissue]
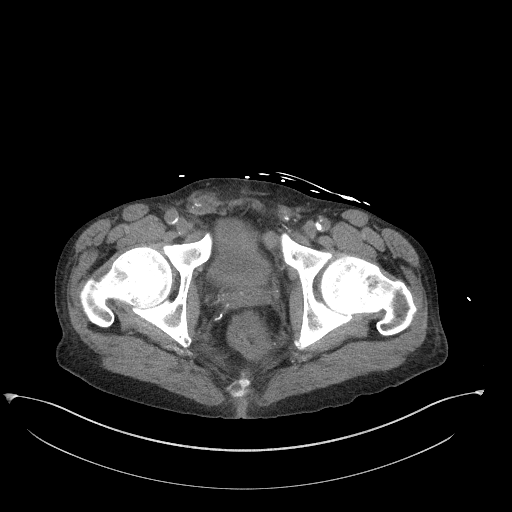
[im 29/110  soft-tissue]
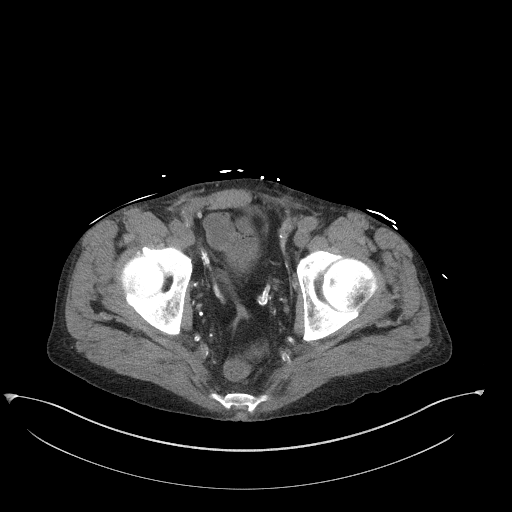
[im 38/110  soft-tissue]
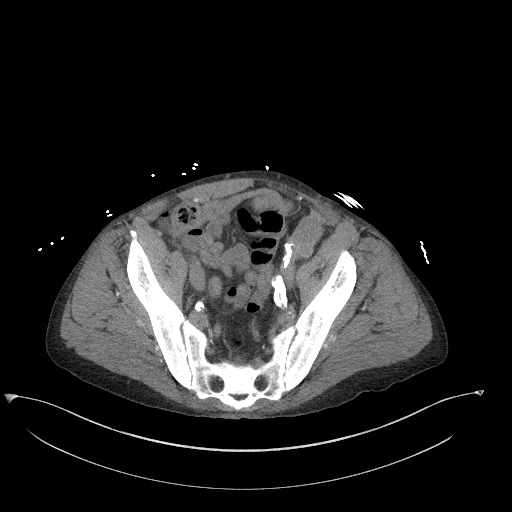
[im 48/110  soft-tissue]
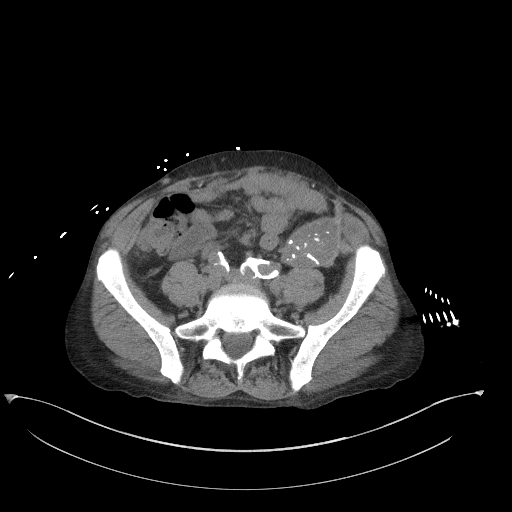
[im 57/110  soft-tissue]
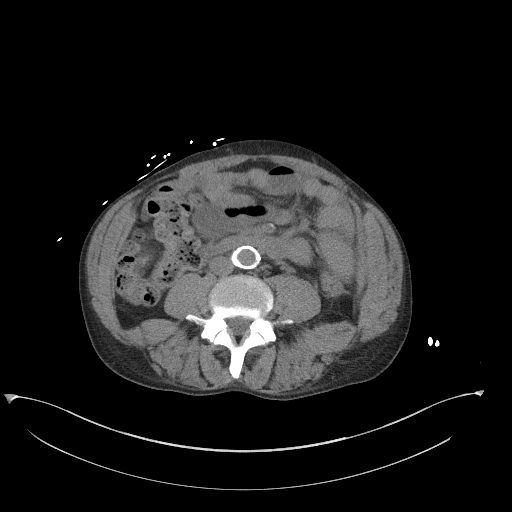
[im 62/110  soft-tissue]
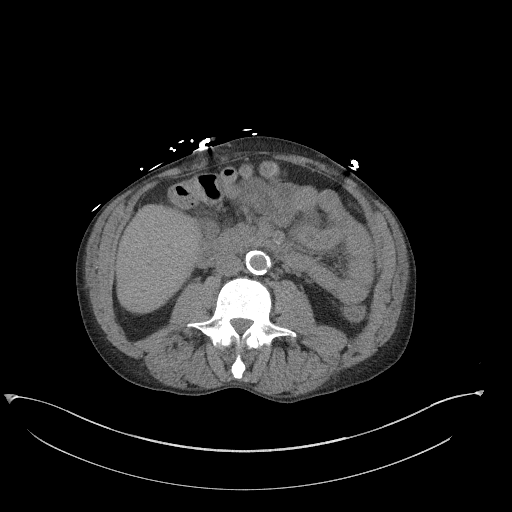
[im 72/110  soft-tissue]
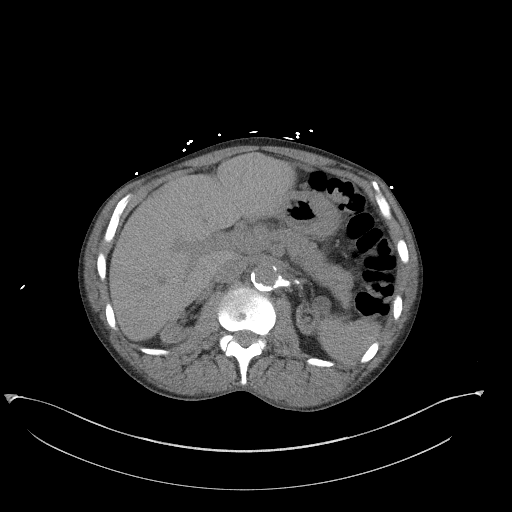
[im 72/110  bone]
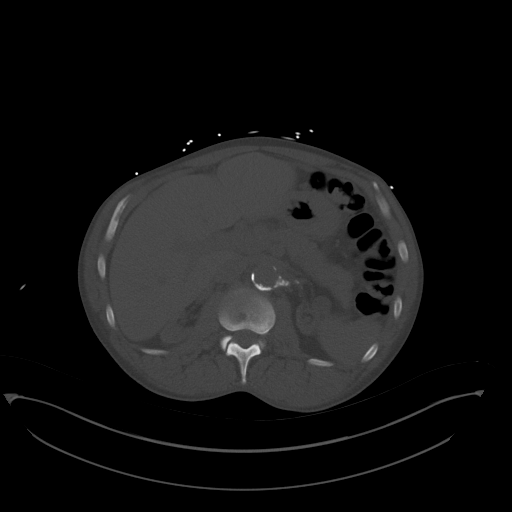
[im 81/110  soft-tissue]
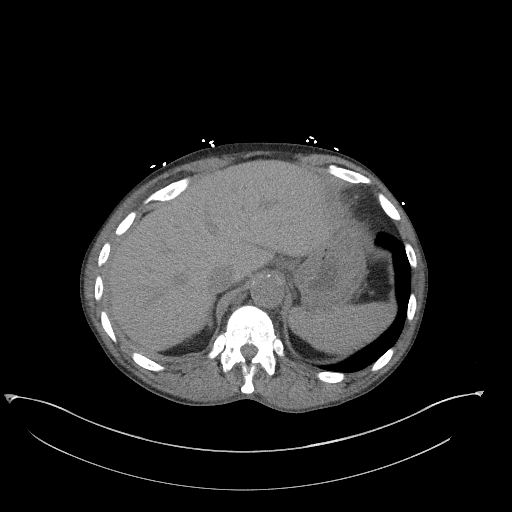
[im 86/110  soft-tissue]
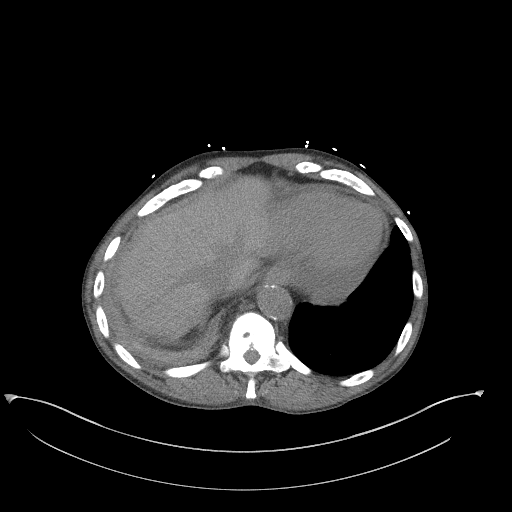
[im 95/110  soft-tissue]
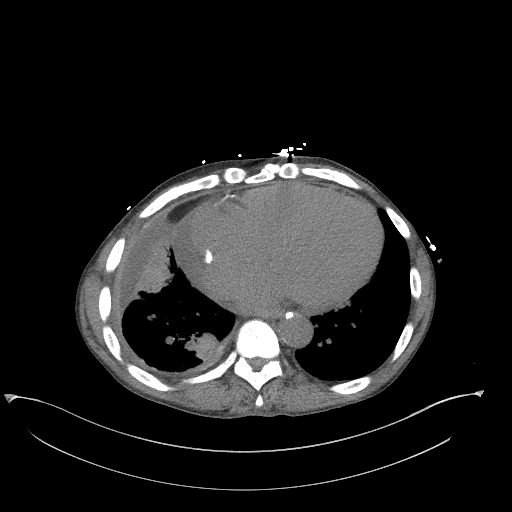
[im 105/110  soft-tissue]
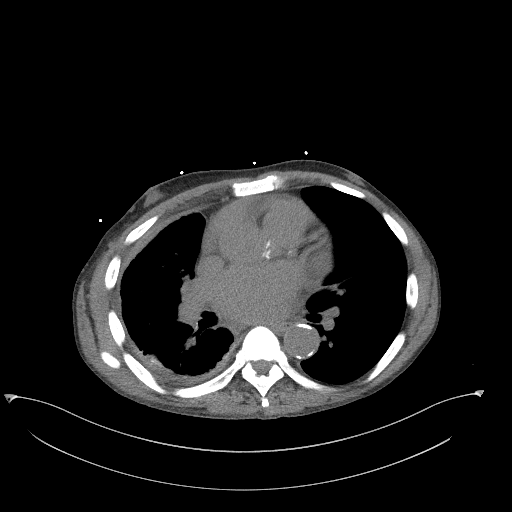

[Series 6: a/p w/o cor · coronal · non-contrast · 0.87mm/px · 3 of 144 slices shown]
[im 48/144  soft-tissue]
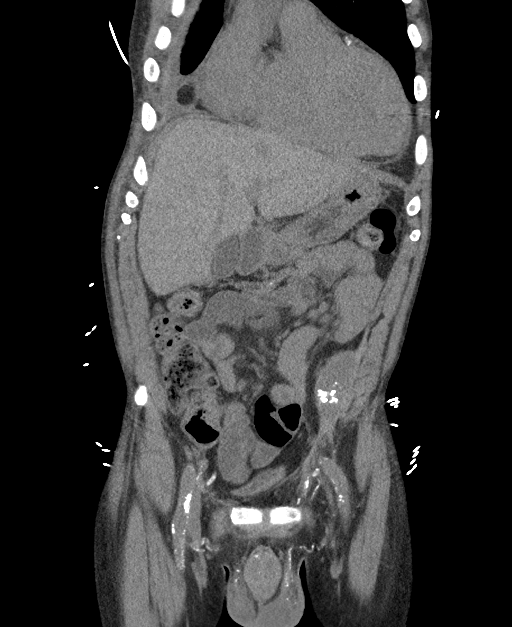
[im 64/144  soft-tissue]
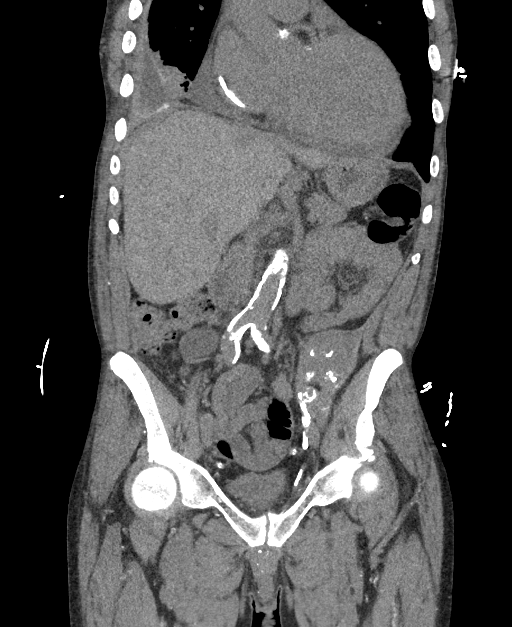
[im 80/144  soft-tissue]
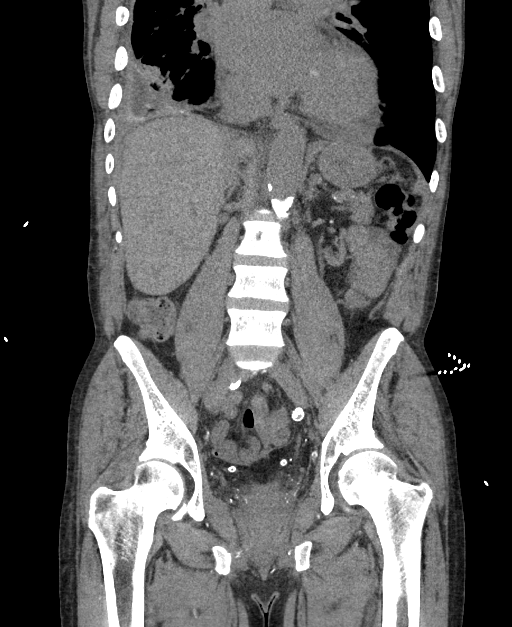

[16 of 46 positions shown; findings below may reference images not displayed]

FINDINGS: Lower chest: No acute findings. Stable right basilar
pleural-parenchymal scarring with associated rounded atelectasis.
Stable cardiomegaly and small pericardial effusion.

Hepatobiliary: No mass visualized on this unenhanced exam.
Gallbladder is unremarkable. No evidence of biliary ductal
dilatation.

Pancreas: No mass or inflammatory process visualized on this
unenhanced exam.

Spleen:  Within normal limits in size.

Adrenals/Urinary tract: Normal adrenal glands. Diffuse atrophy of
native kidneys again seen, consistent with end-stage renal disease.
3 cm high attenuation left renal lesion is stable, consistent with
hemorrhagic cyst. Failed renal transplant with calcification is
again seen in the left iliac fossa.

Stomach/Bowel: No evidence of obstruction, inflammatory process, or
abnormal fluid collections.

Vascular/Lymphatic: No pathologically enlarged lymph nodes
identified. No evidence of abdominal aortic aneurysm. Aortic
atherosclerosis.

Reproductive:  No mass or other significant abnormality.

Other:  None.

Musculoskeletal:  No suspicious bone lesions identified.
IMPRESSION: No acute findings within the abdomen or pelvis.

Stable appearance of failed, partially calcified renal transplant in
left iliac fossa.

Severe atrophy of native kidneys, consistent with end-stage renal
disease. No evidence of hydronephrosis.

Aortic Atherosclerosis ([id number]).

## 2021-09-24 IMAGING — DX DG CHEST 1V PORT
1 series · 1 of 1 positions shown · non-contrast
Comparison: 04/17/2019, CT 04/20/2019

CLINICAL DATA: Abdominal pain and vomiting

EXAM:
PORTABLE CHEST 1 VIEW

[chest ap]
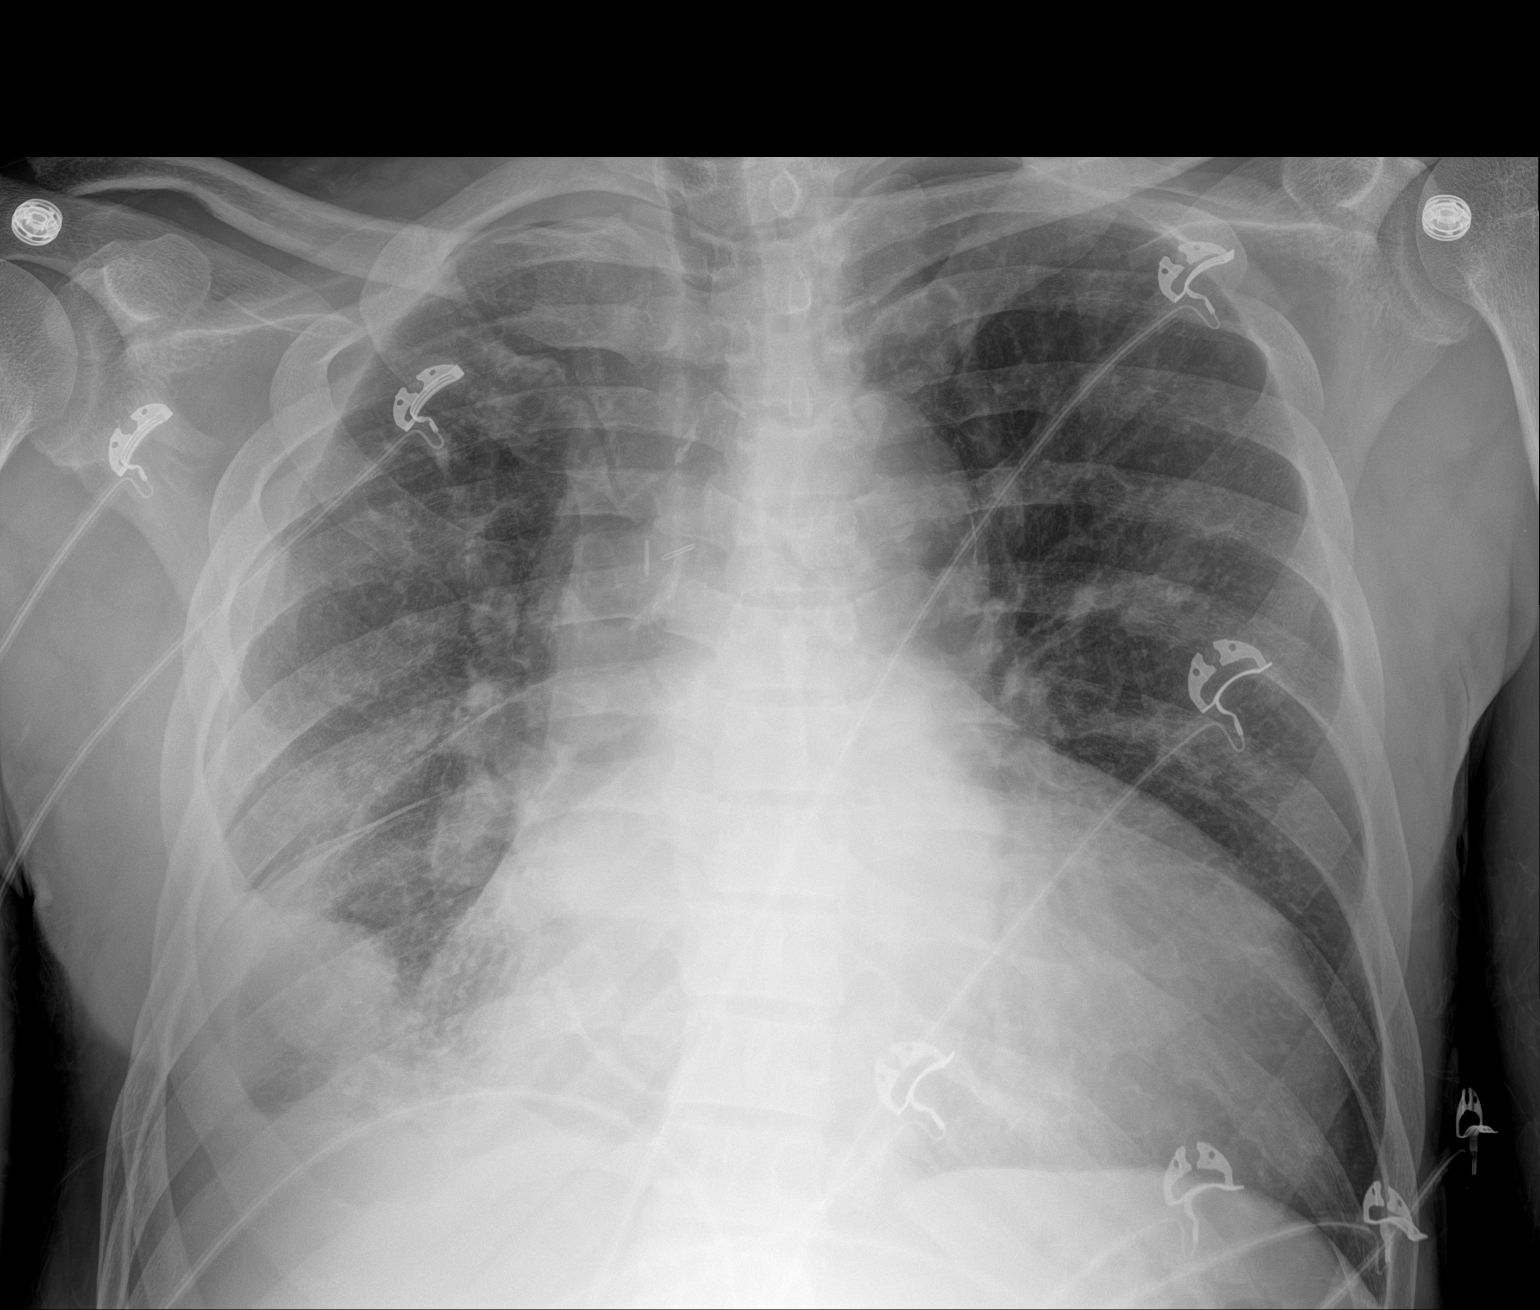

[1 of 1 positions shown; findings below may reference images not displayed]

FINDINGS: Enlarged cardiac silhouette. Loculated pleural fluid andatelectasis
seen at the RIGHT lung base as described on CT 04/20/2019. Mild
central venous pulmonary congestion. No focal consolidation. No
pneumothorax.
IMPRESSION: 1. No interval change.
2. Marked cardiomegaly.
3. RIGHT pleural effusion with basilar loculation and atelectasis.

## 2021-10-05 IMAGING — DX DG CHEST 1V PORT
1 series · 1 of 1 positions shown · non-contrast
Comparison: 05/06/2019

CLINICAL DATA: Short of breath.  Missed dialysis

EXAM:
PORTABLE CHEST 1 VIEW

[chest ap]
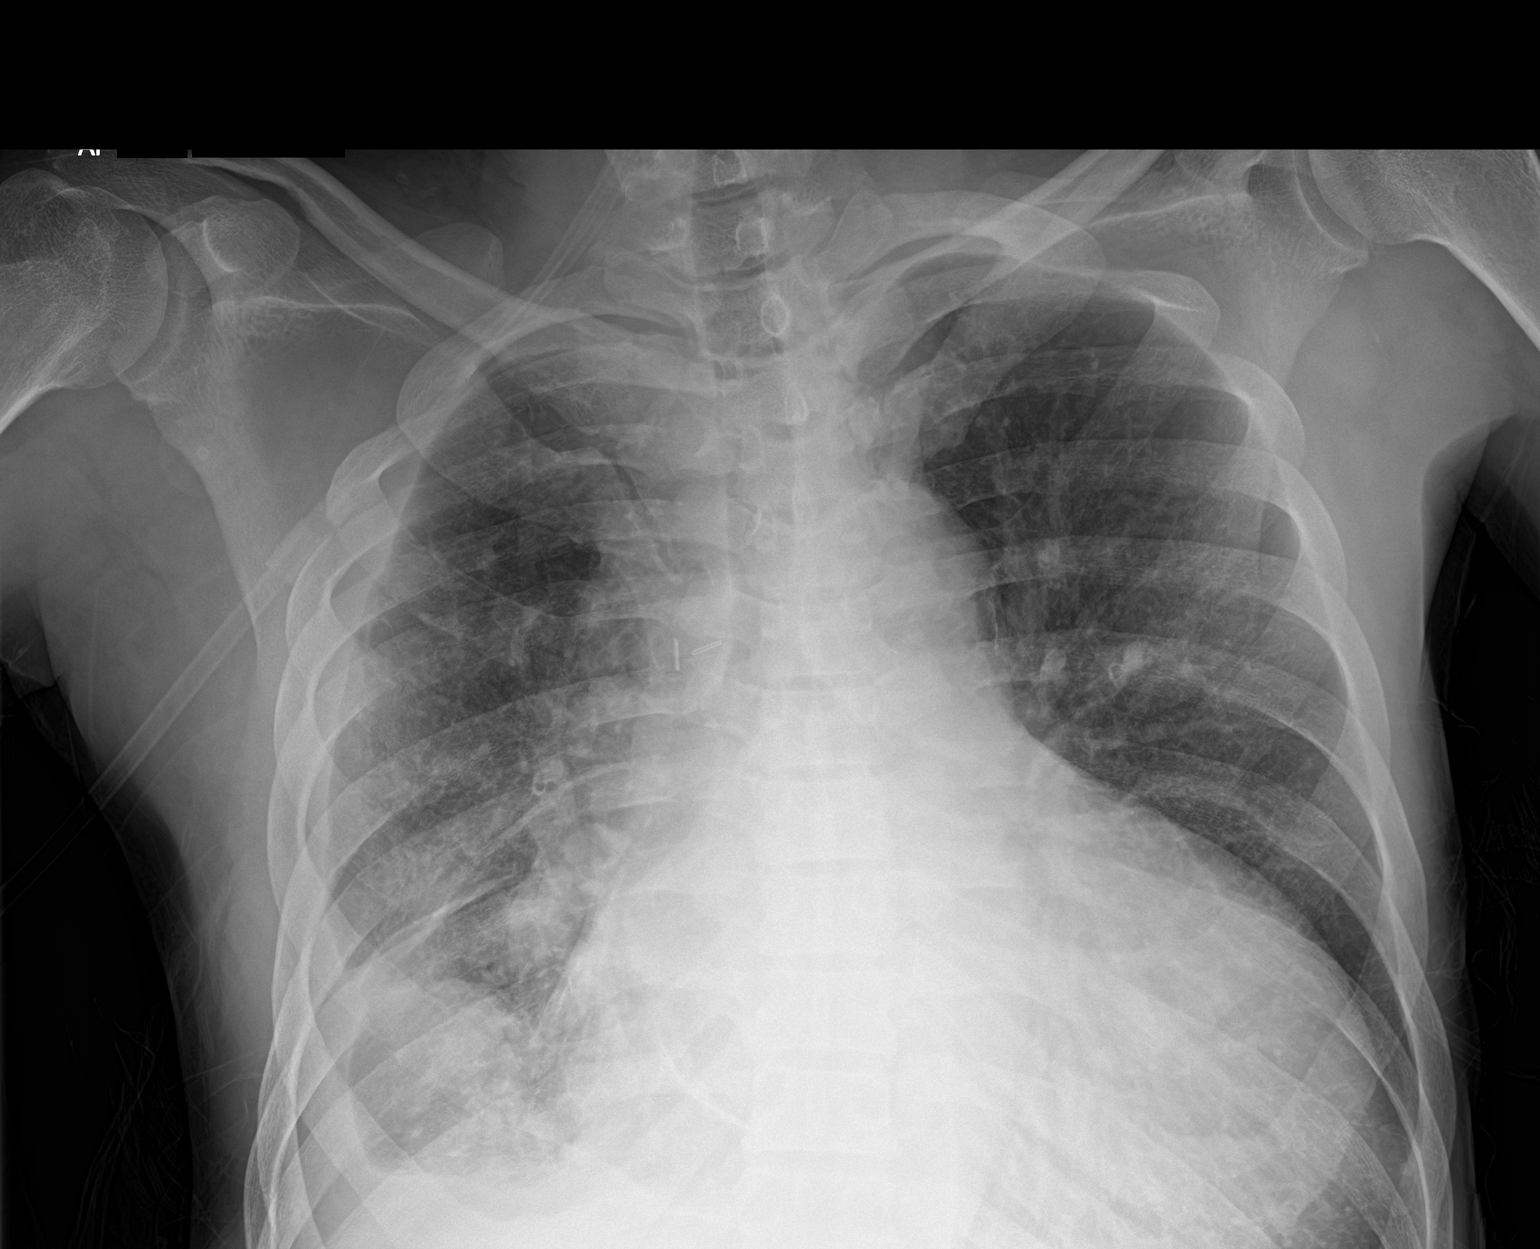

[1 of 1 positions shown; findings below may reference images not displayed]

FINDINGS: Cardiac enlargement. Progression of vascular congestion and mild
edema. Mild to moderate right pleural effusion unchanged with right
lower lobe atelectasis. Surgical clips overlying the right hilum
unchanged.
IMPRESSION: Progression of fluid overload with mild edema. Right pleural
effusion approximately unchanged.

## 2021-10-06 IMAGING — DX DG ABDOMEN 1V
1 series · 1 of 1 positions shown · non-contrast
Comparison: [DATE] [DATE], [DATE].  [DATE] [DATE], [DATE].

CLINICAL DATA: Acute right-sided abdominal pain.

EXAM:
ABDOMEN - 1 VIEW

[t abdomen supine]
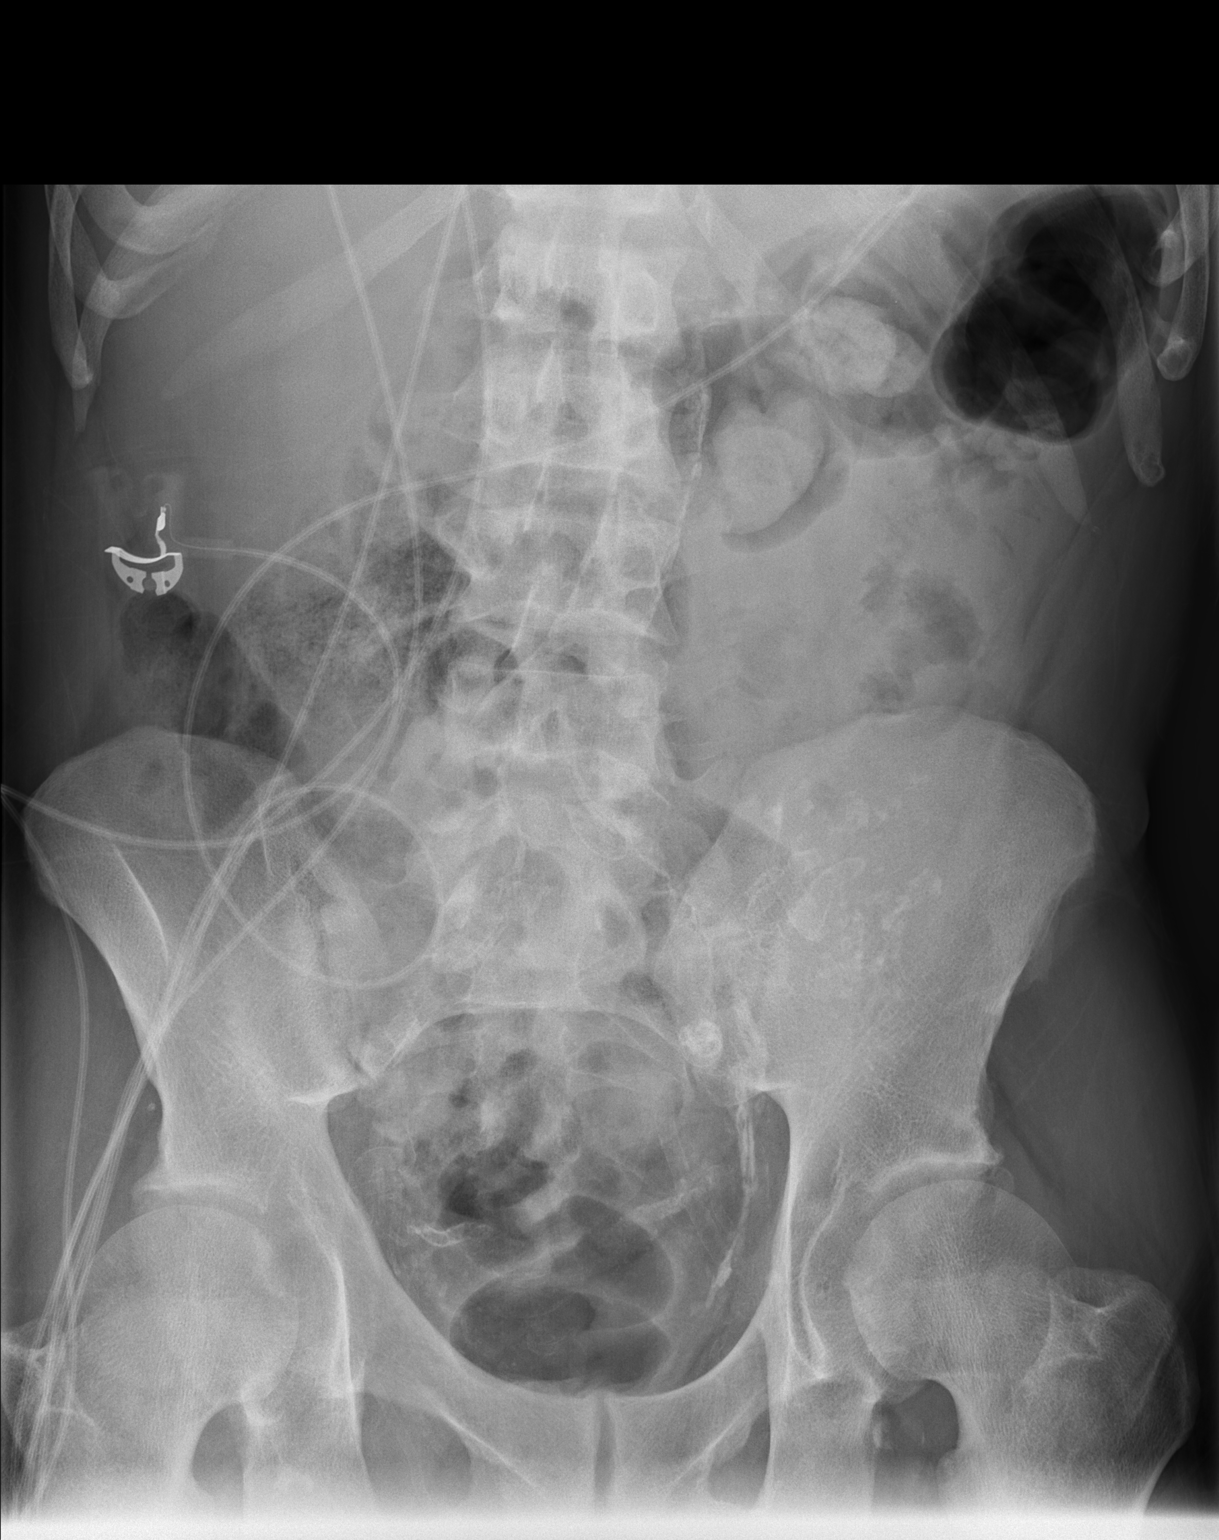

[1 of 1 positions shown; findings below may reference images not displayed]

FINDINGS: The bowel gas pattern is normal. No radio-opaque calculi or other
significant radiographic abnormality are seen.
IMPRESSION: No evidence of bowel obstruction or ileus.

Aortic Atherosclerosis ([id number]).

## 2021-10-16 IMAGING — CR DG ABDOMEN ACUTE W/ 1V CHEST
3 series · 3 of 3 positions shown · non-contrast
Comparison: 05/18/2019

CLINICAL DATA: Pancreatitis

EXAM:
DG ABDOMEN ACUTE W/ 1V CHEST

[w abdomen decub]
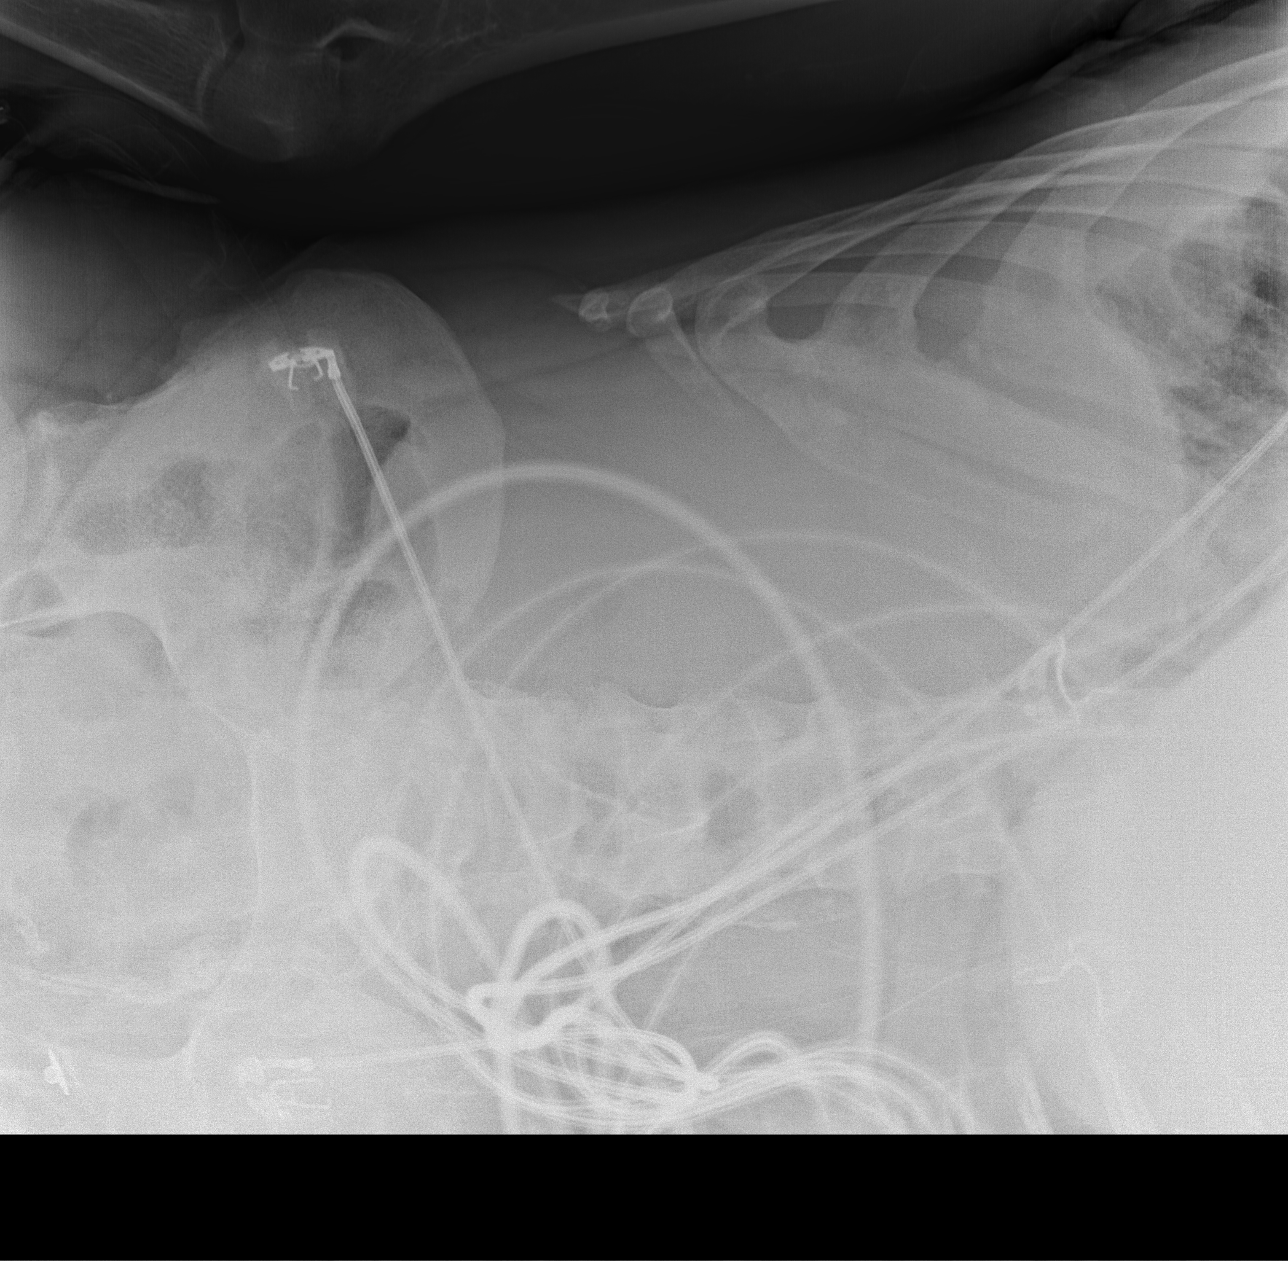

[x abdomen supine]
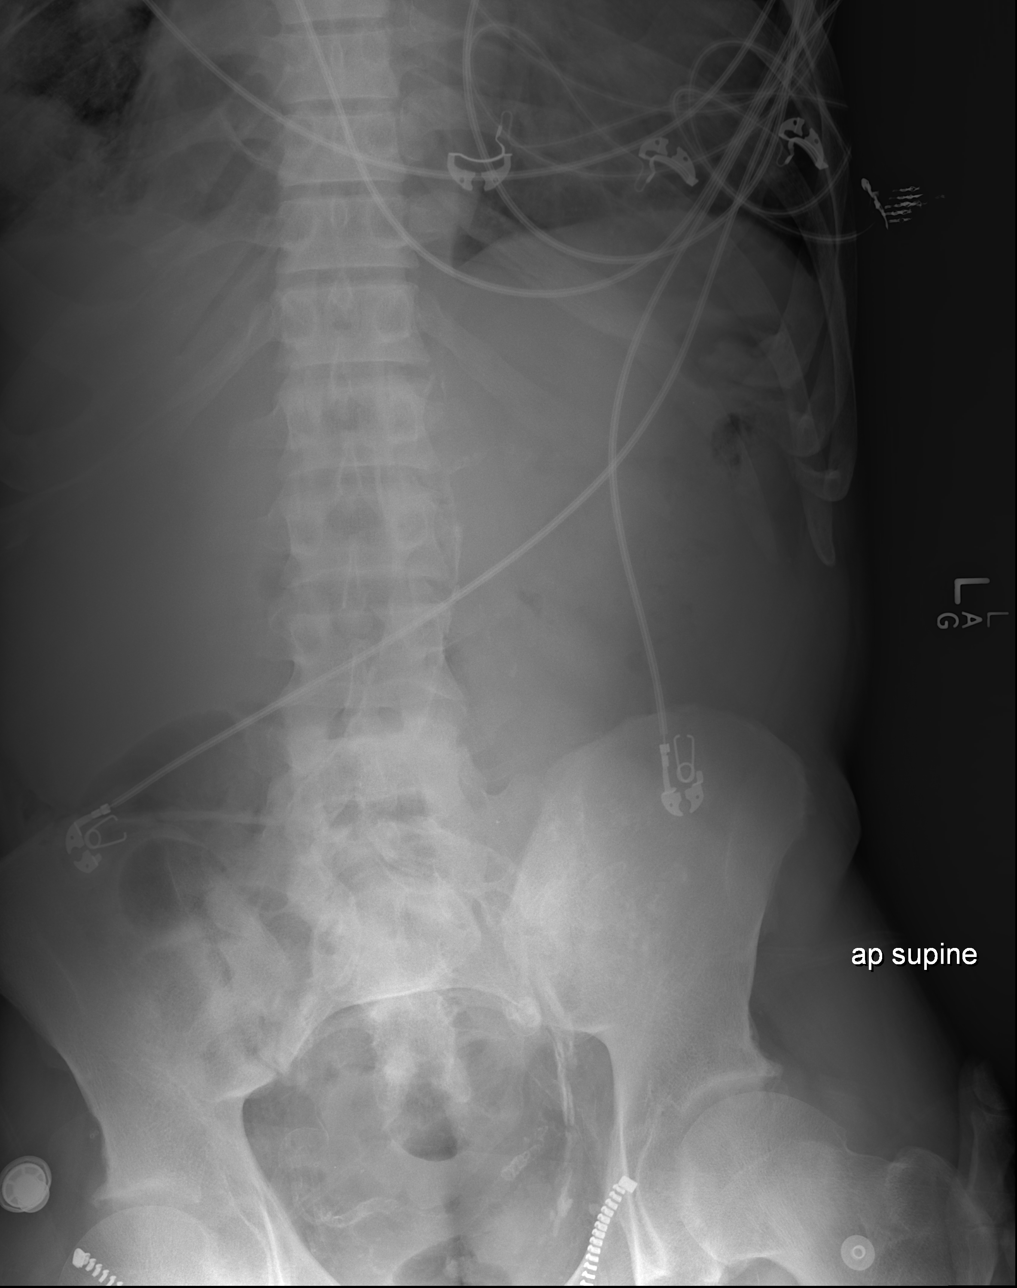

[x chest ap]
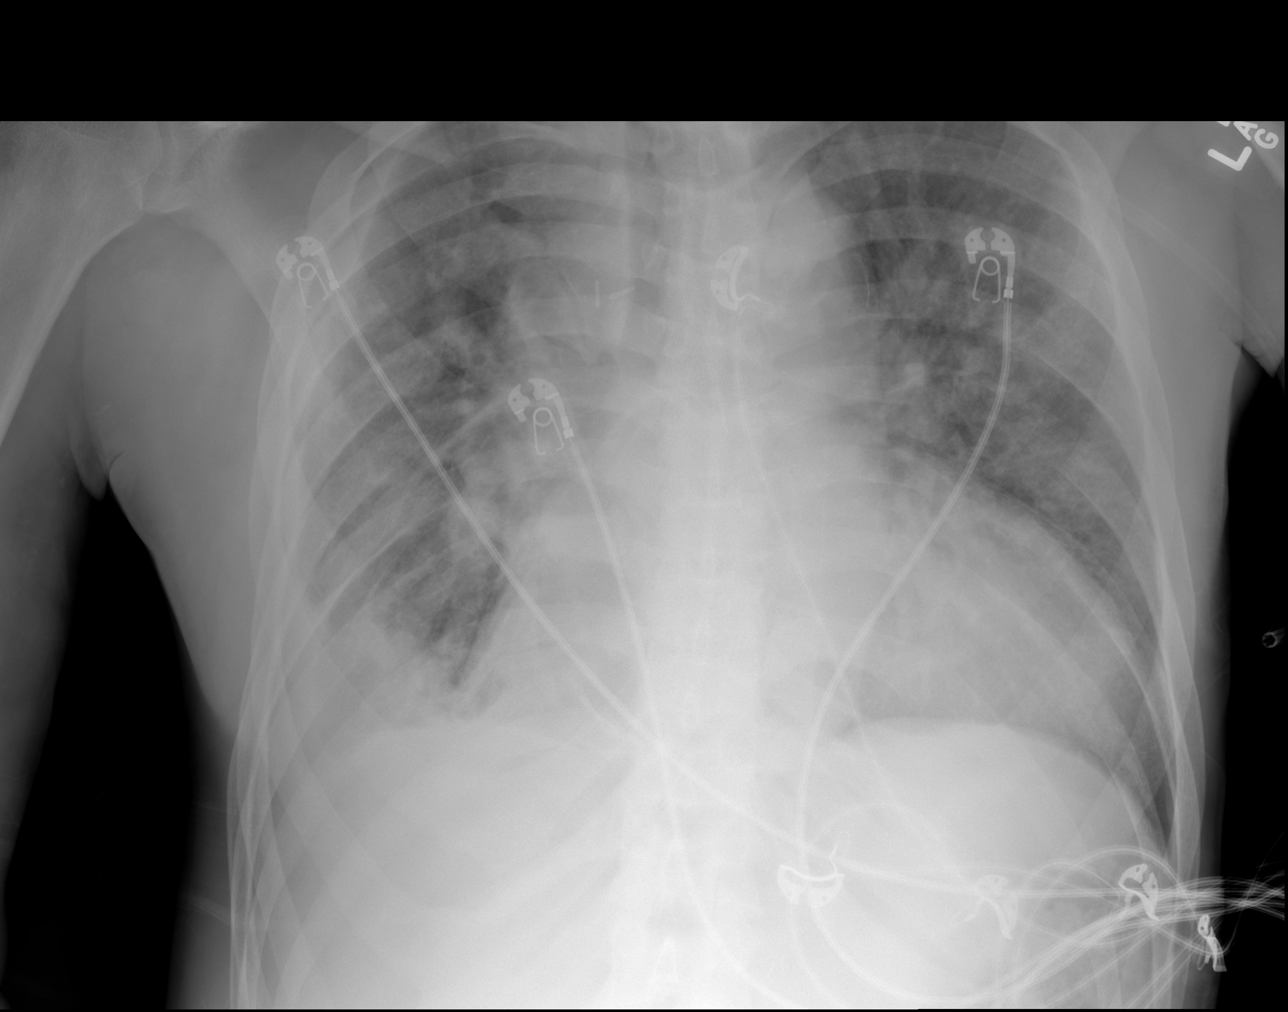

[3 of 3 positions shown; findings below may reference images not displayed]

FINDINGS: Cardiomegaly and vascular pedicle widening. Airspace opacity with
pleural fluid at the right base.

Normal bowel gas pattern. No evidence of pneumoperitoneum. No
concerning mass effect but limited by overlapping artifact.
Extensive arterial calcification
IMPRESSION: 1. Normal bowel gas pattern.  No evidence of pneumoperitoneum.
2. Chronic pleuroparenchymal opacification at the right base.

## 2021-10-22 IMAGING — DX DG CHEST 1V PORT
1 series · 1 of 1 positions shown · non-contrast
Comparison: 05/17/2019.

CLINICAL DATA: Dialysis.  Right upper quadrant pain.  Pancreatitis.

EXAM:
PORTABLE CHEST 1 VIEW

[chest ap]
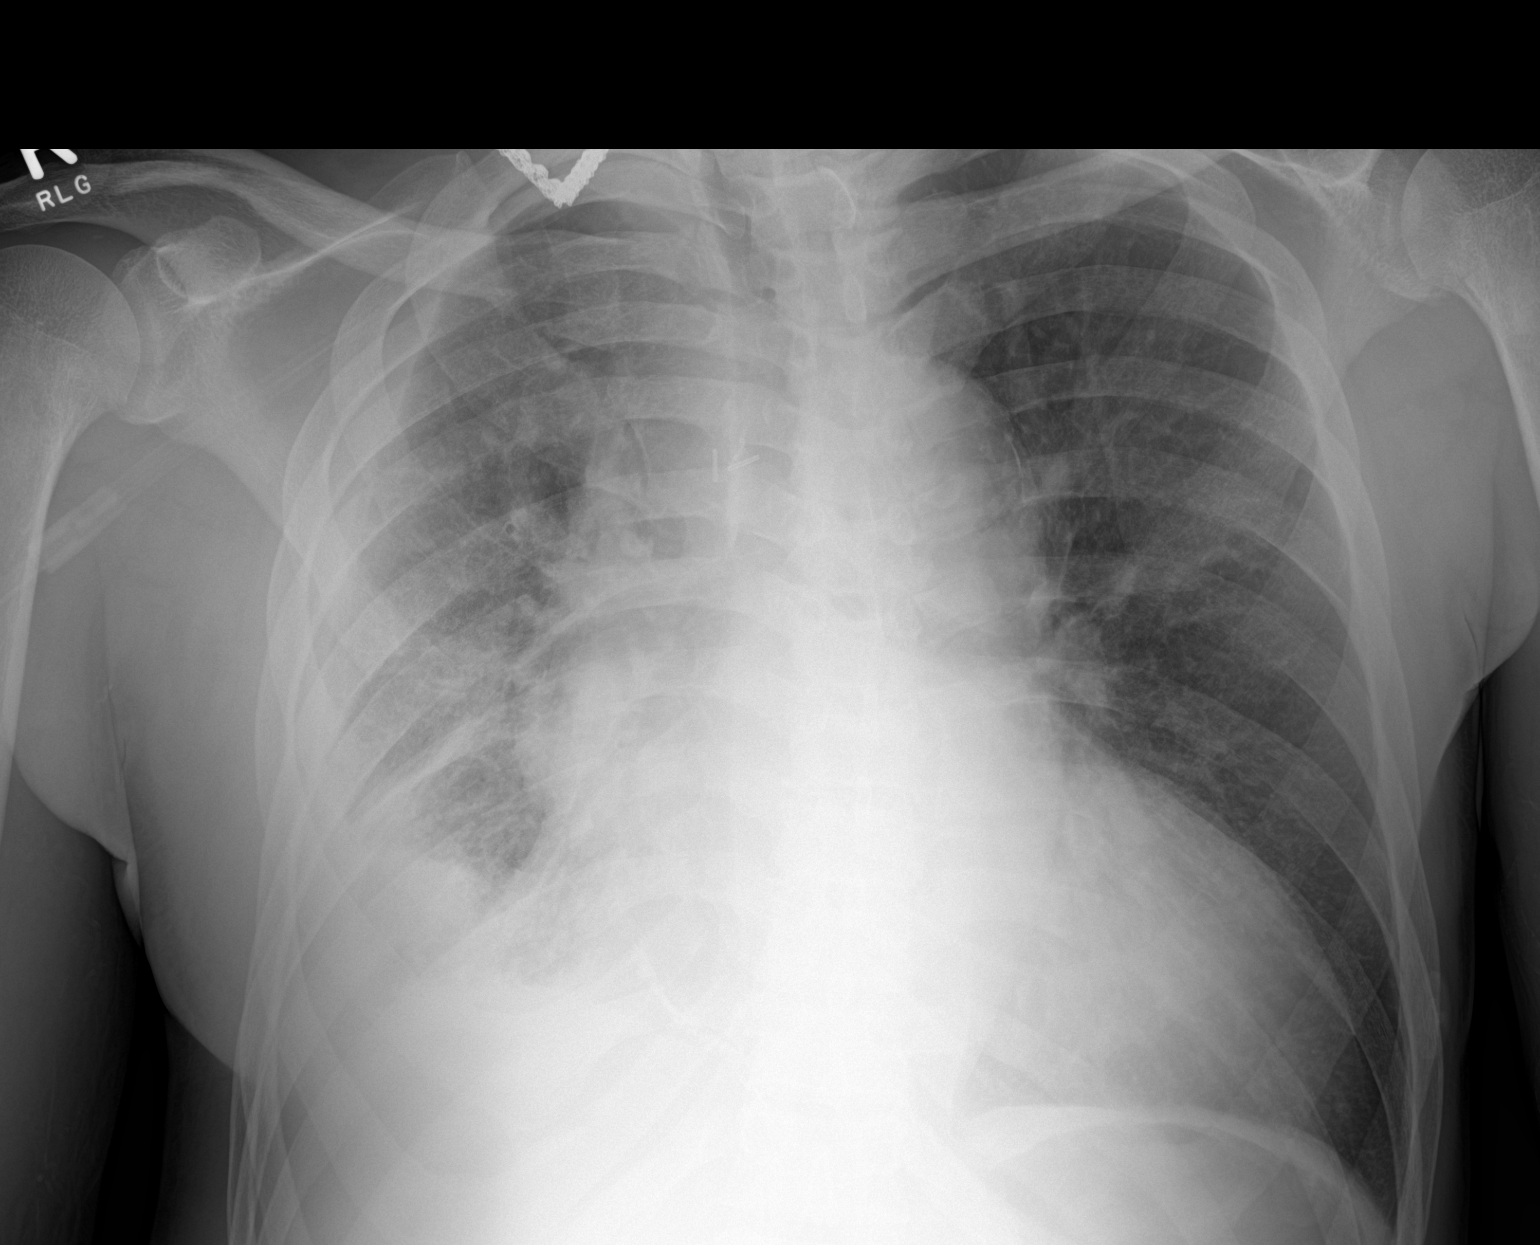

[1 of 1 positions shown; findings below may reference images not displayed]

FINDINGS: Surgical clips noted over the chest. Severe cardiomegaly again
noted. Bilateral interstitial prominence, right side greater than
left again noted. Right pleural effusion, slightly increased from
prior exam. Effusion again noted. Findings suggest CHF. No
pneumothorax.
IMPRESSION: Severe cardiomegaly again noted. Bilateral interstitial prominence,
right side greater than left again noted. Right pleural effusion,
slightly increased from prior exam. Findings suggest CHF.

## 2021-11-05 IMAGING — CT CT ABD-PELV W/O CM
2 of 4 series · 16 of 46 positions shown, 18 images · non-contrast
Comparison: CT, 05/06/2019 and older exams.

CLINICAL DATA: Patient presenting with 3 to 4-day history of
worsening epigastric abdominal pain worse than his chronic pain per
patient, with associated 4 to 5-day history of nausea and vomiting
with dark emesis and some reddish emesis per patient.

EXAM:
CT ABDOMEN AND PELVIS WITHOUT CONTRAST
TECHNIQUE: Multidetector CT imaging of the abdomen and pelvis was performed
following the standard protocol without IV contrast.

[Series 3: axial st · axial · 0.75mm/px · z∈[-458,-43]mm · 13 of 95 slices shown, 15 images]
[im 6/95  soft-tissue]
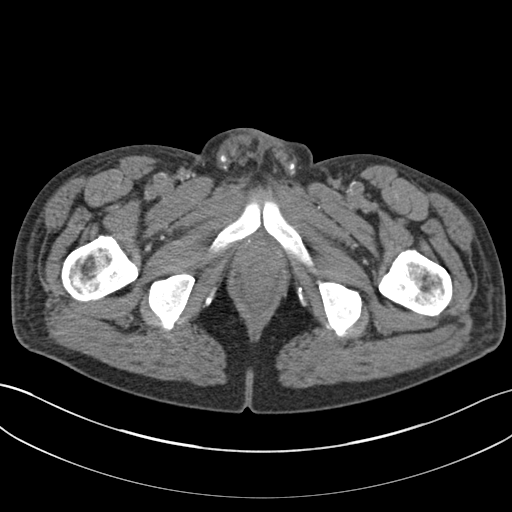
[im 6/95  bone]
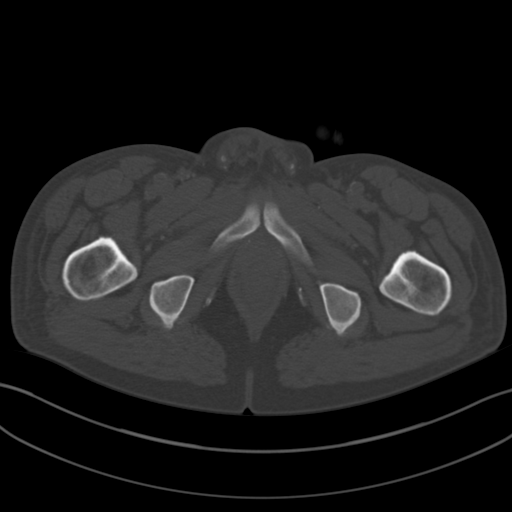
[im 11/95  soft-tissue]
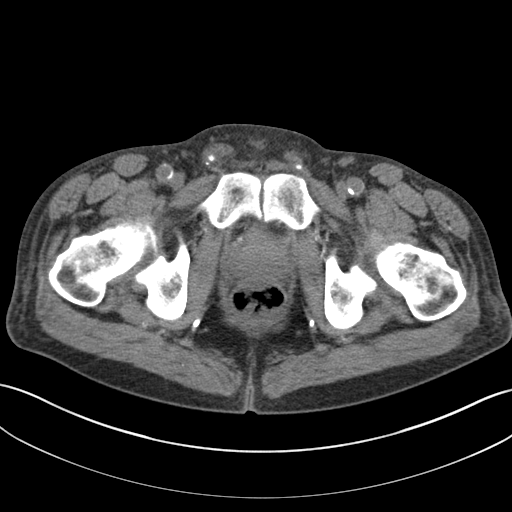
[im 21/95  soft-tissue]
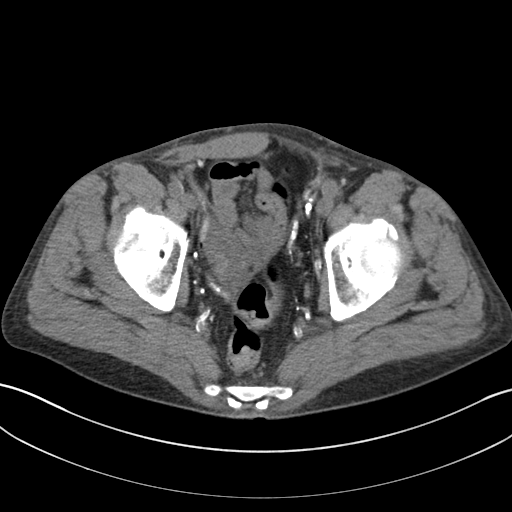
[im 27/95  soft-tissue]
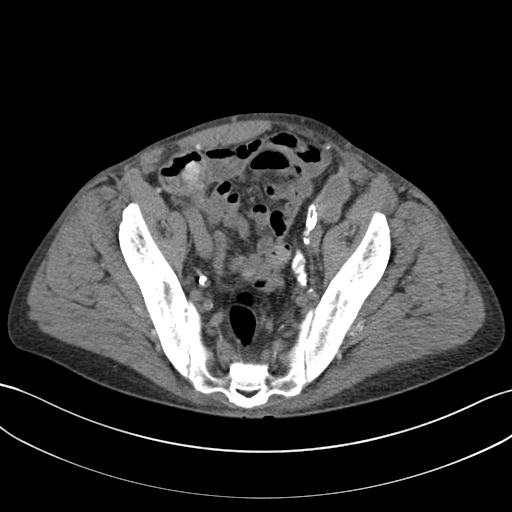
[im 32/95  soft-tissue]
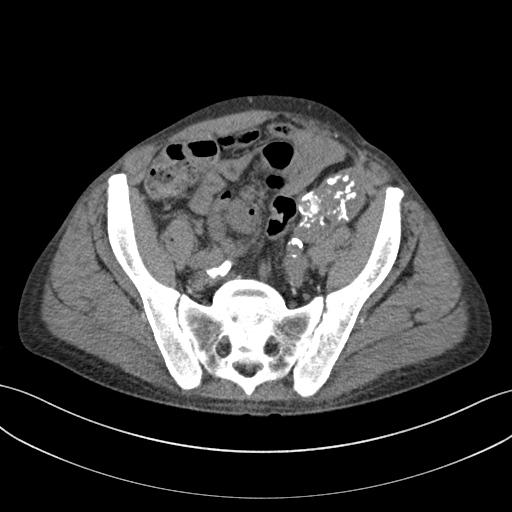
[im 42/95  soft-tissue]
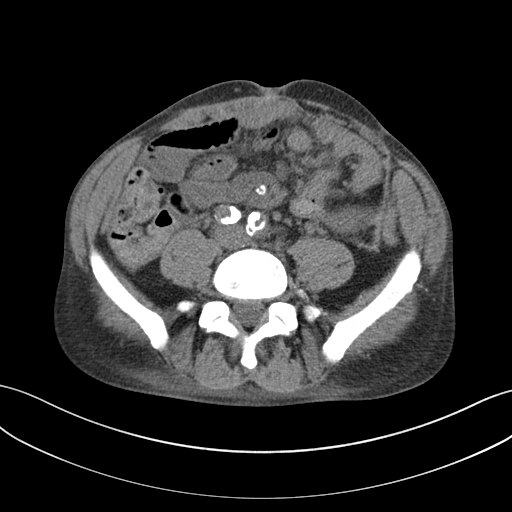
[im 48/95  soft-tissue]
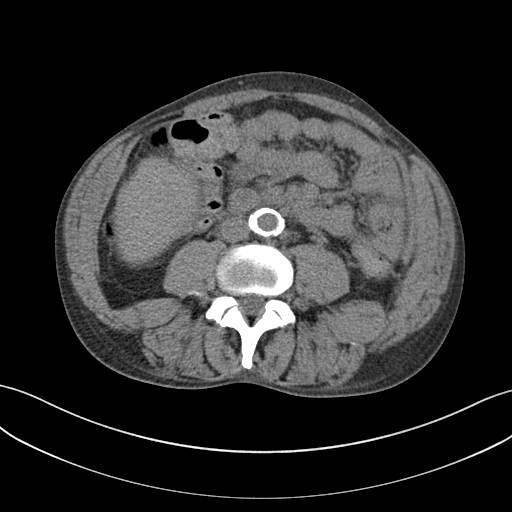
[im 53/95  soft-tissue]
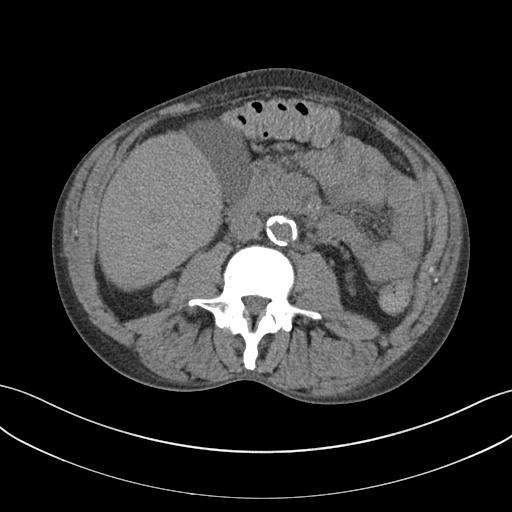
[im 63/95  soft-tissue]
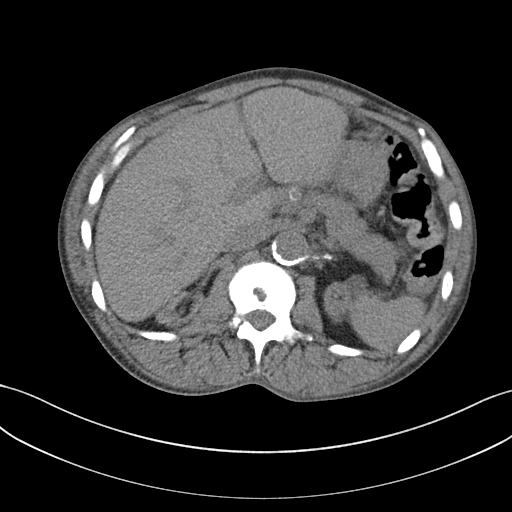
[im 63/95  bone]
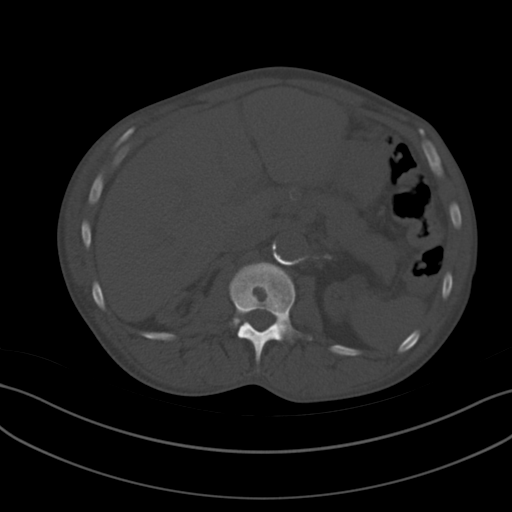
[im 68/95  soft-tissue]
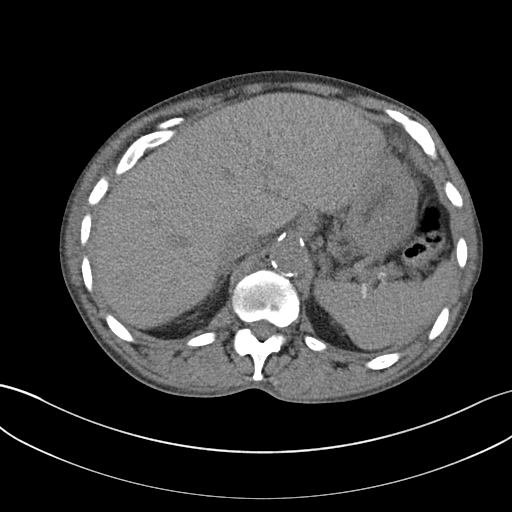
[im 74/95  soft-tissue]
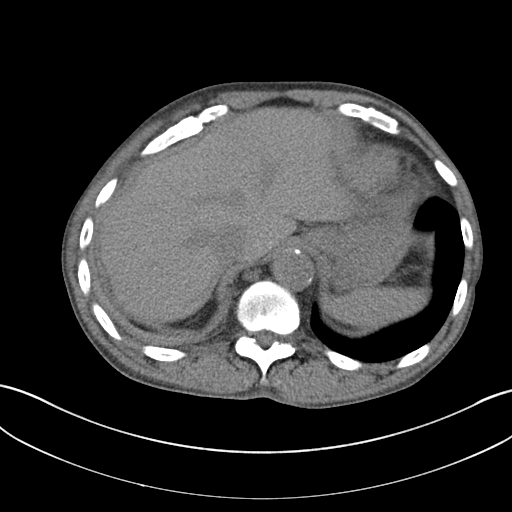
[im 84/95  soft-tissue]
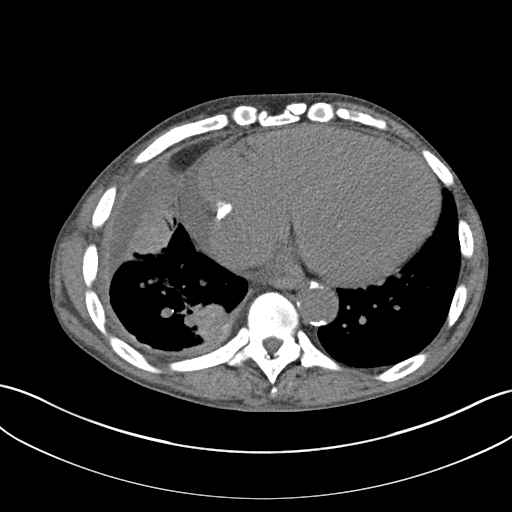
[im 89/95  soft-tissue]
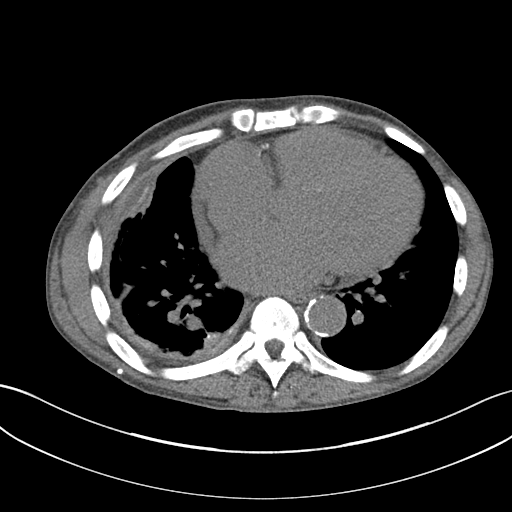

[Series 4: coronal st · coronal · 0.95mm/px · 3 of 139 slices shown]
[im 47/139  soft-tissue]
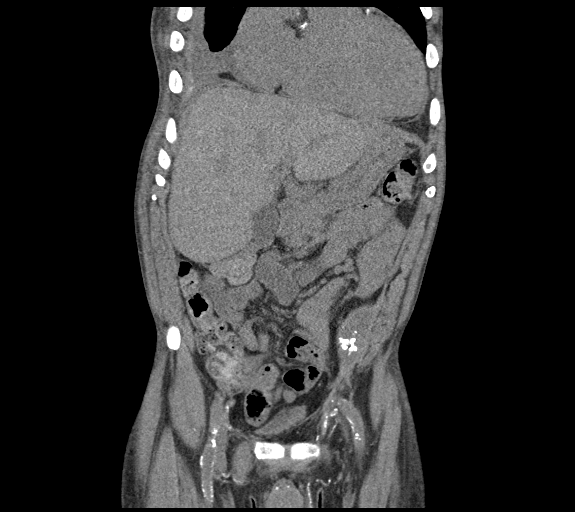
[im 62/139  soft-tissue]
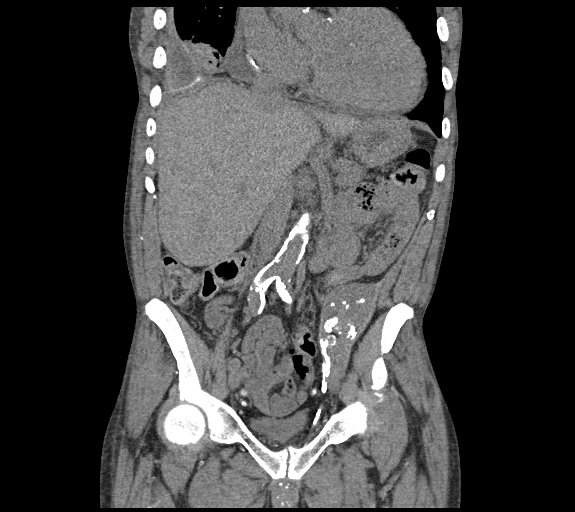
[im 77/139  soft-tissue]
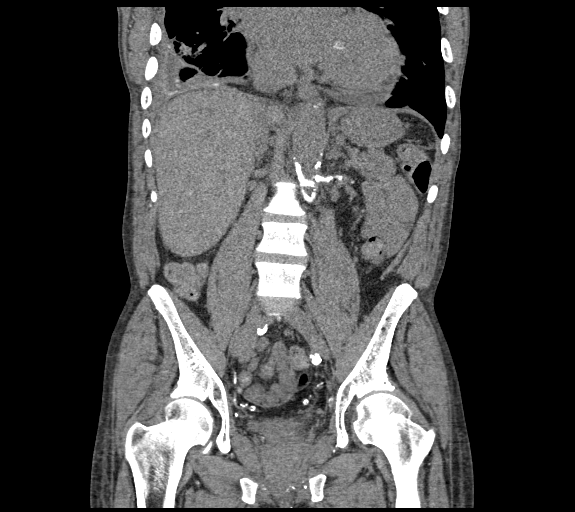

[16 of 46 positions shown; findings below may reference images not displayed]

FINDINGS: Lower chest: Chronic right lung base pleuroparenchymal scarring and
rounded atelectasis, without significant change from the prior CT.
Stable mild-to-moderate cardiomegaly. Stable trace pericardial
effusion. No acute findings.

Hepatobiliary: No focal liver abnormality is seen. No gallstones,
gallbladder wall thickening, or biliary dilatation.

Pancreas: Unremarkable. No pancreatic ductal dilatation or
surrounding inflammatory changes.

Spleen: Normal in size without focal abnormality.

Adrenals/Urinary Tract: Marked renal atrophy. Homogeneous
hyperattenuating 3.3 cm left renal mass consistent a hemorrhagic
cyst, stable from prior CT. Smaller adjacent hypoattenuating left
renal mass consistent with a simple cyst, also unchanged. No
hydronephrosis. Normal ureters. Bladder is mostly decompressed but
otherwise unremarkable.

Soft tissue with dystrophic calcifications in the left iliac fossa
is stable reflecting failed transplant kidney.

Stomach/Bowel:

Stomach mostly decompressed. No evidence of wall thickening. No
adjacent inflammation.

Small bowel and colon are normal in caliber. No wall thickening or
inflammation. No evidence of appendicitis.

Vascular/Lymphatic: Dense aortic atherosclerotic calcifications. No
aneurysm. No enlarged lymph nodes.

Reproductive: Unremarkable.

Other: No abdominal wall hernia.  No ascites.

Musculoskeletal: Skeletal changes of renal osteodystrophy. No
fracture. No bone lesion.
IMPRESSION: 1. No acute findings within the abdomen or pelvis. No findings to
account for the patient's symptoms.
2. Multiple chronic findings as detailed above, which are stable
from CT.

## 2021-11-26 IMAGING — DX DG ABDOMEN ACUTE W/ 1V CHEST
4 series · 4 of 4 positions shown · non-contrast
Comparison: Radiograph 05/28/2019, CT 06/17/2019

CLINICAL DATA: Complications with dialysis, lower back and
abdominal pain radiating to chest, nausea and vomiting

EXAM:
DG ABDOMEN ACUTE W/ 1V CHEST

[chest pa]
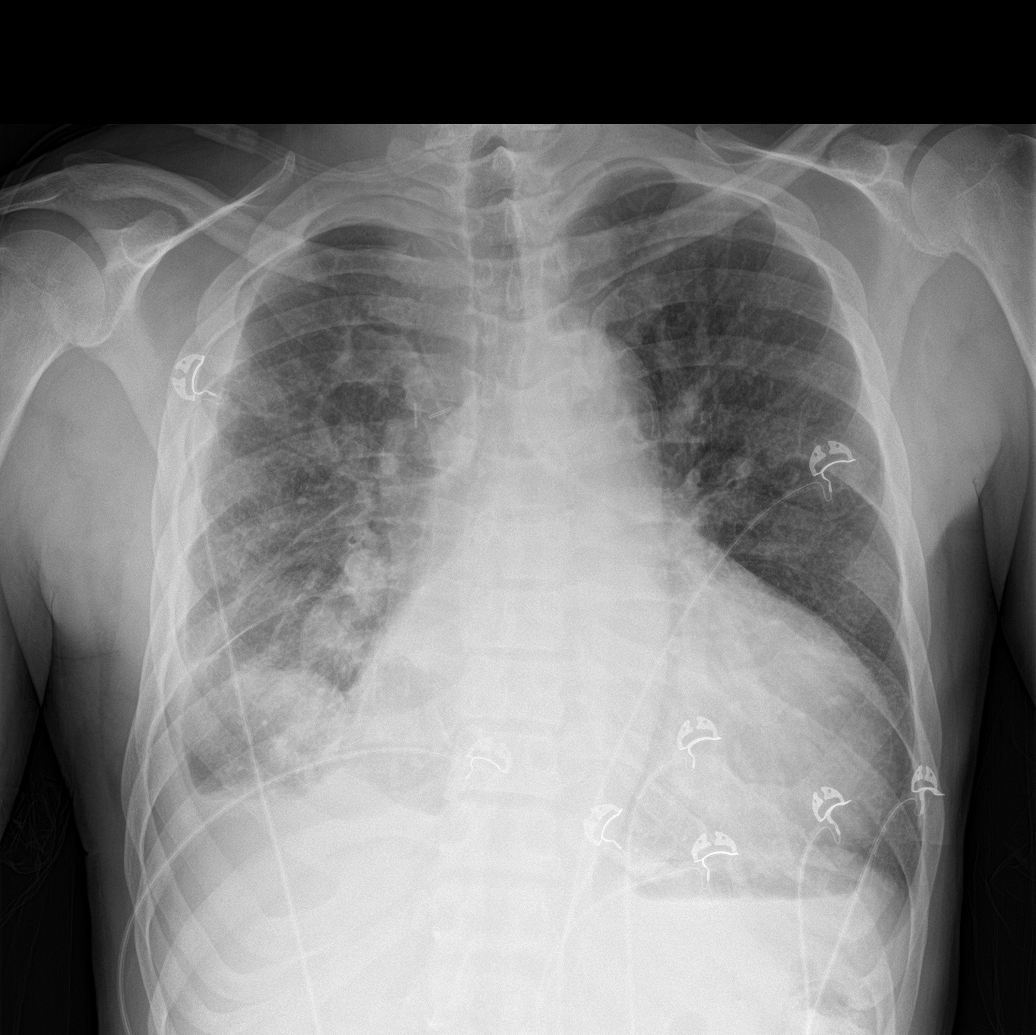

[abdomen erect]
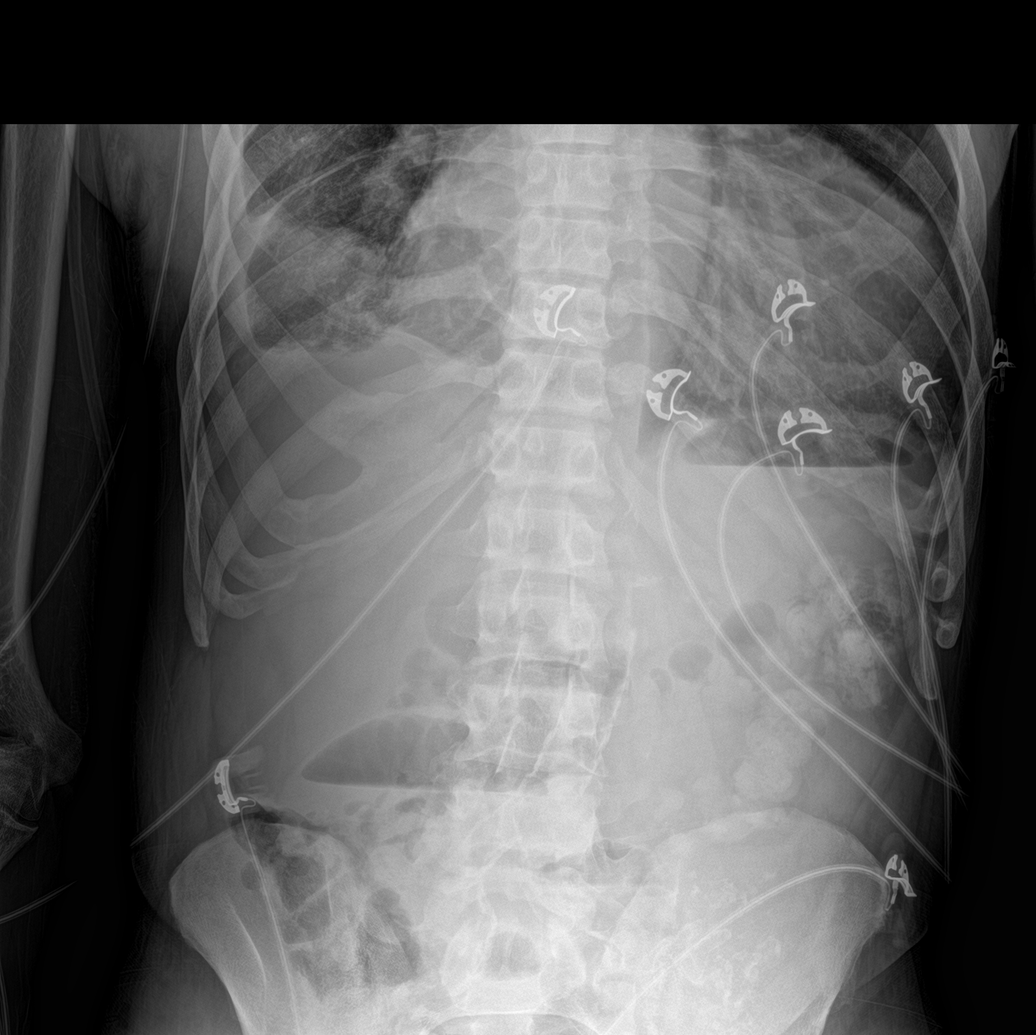

[abdomen supine]
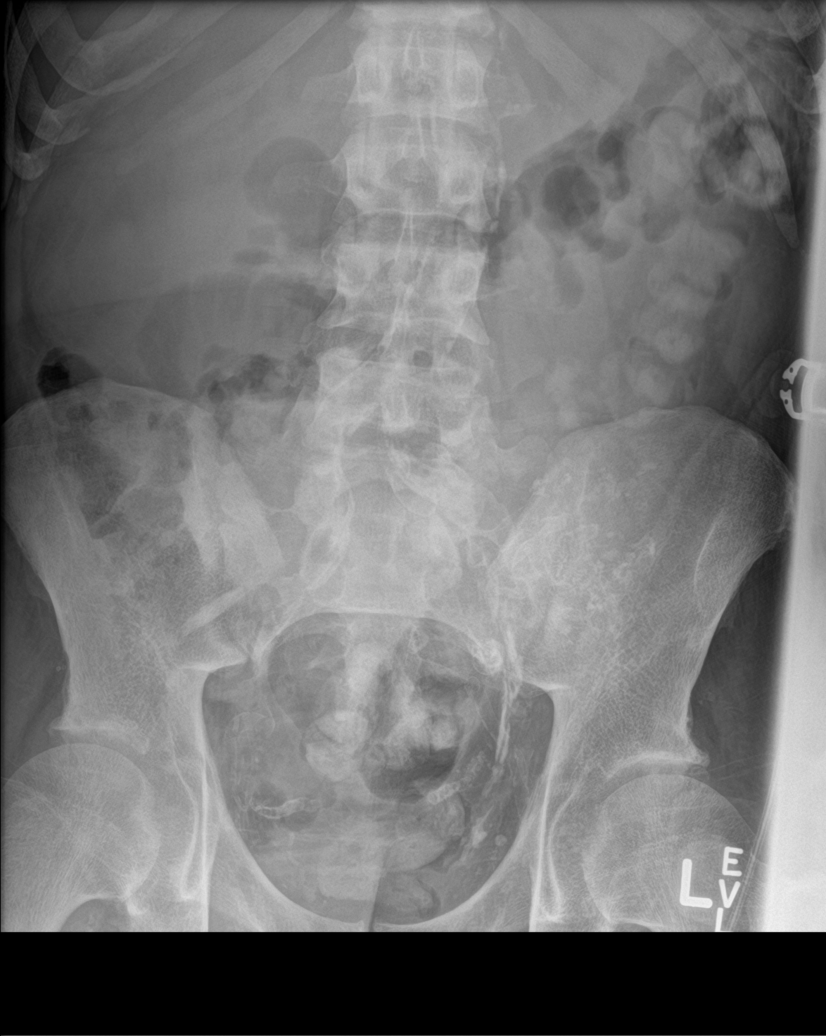

[abdomen kub]
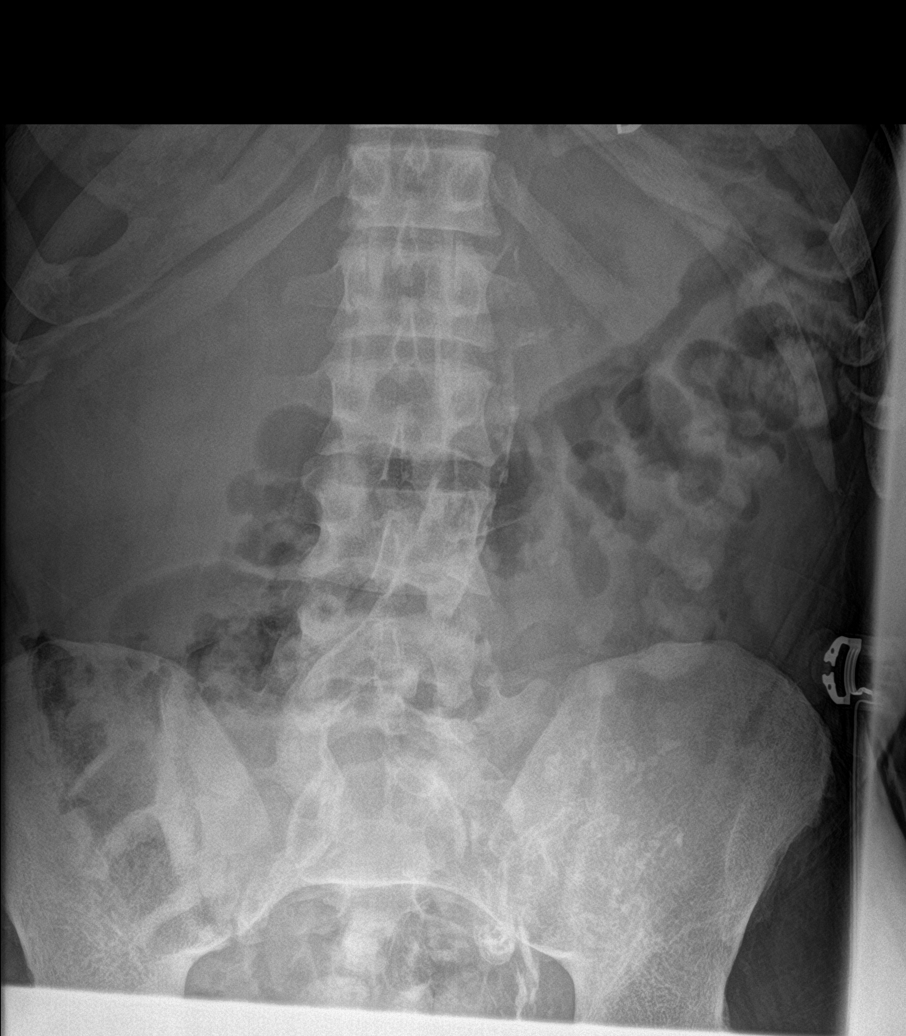

[4 of 4 positions shown; findings below may reference images not displayed]

FINDINGS: There are bilateral pleural effusions with more widespread hazy
interstitial opacities. More focal patchy opacity is seen in the
right upper lung and lung base. Cardiomegaly is similar to priors.
The aorta is calcified. Right hilar/mediastinal surgical clips are
seen. The remaining cardiomediastinal contours are unremarkable.

There are 2 distended loops of small bowel in the right upper
quadrant with layering air-fluid levels on upright radiography.
Remaining bowel gas pattern is normal in appearance. No suspicious
calcifications.

Degenerative changes present in the imaged spine, shoulders and
pelvis. No acute or suspicious osseous abnormality is seen.
IMPRESSION: 1. Findings suggesting volume overload/CHF with cardiomegaly,
bilateral effusions (right greater than left) and diffuse hazy
interstitial opacity.
2. More focal patchy opacity throughout the right lung is compatible
with chronic pleuroparenchymal changes seen on comparison studies.
3. Focally air distended loops of small bowel in the upper abdomen
with layering air-fluid levels. Could reflect an early obstruction
or focal ileus. Correlate with abdominal symptoms.

## 2021-11-26 IMAGING — CT CT ABD-PELV W/O CM
2 of 4 series · 15 of 46 positions shown, 17 images · non-contrast
Comparison: June 17, 2019

CLINICAL DATA: Bowel obstruction.  Nausea and vomiting.

EXAM:
CT ABDOMEN AND PELVIS WITHOUT CONTRAST
TECHNIQUE: Multidetector CT imaging of the abdomen and pelvis was performed
following the standard protocol without IV contrast.

[Series 2: axial st · axial · 0.82mm/px · z∈[-511,-86]mm · 12 of 93 slices shown, 14 images]
[im 4/93  soft-tissue]
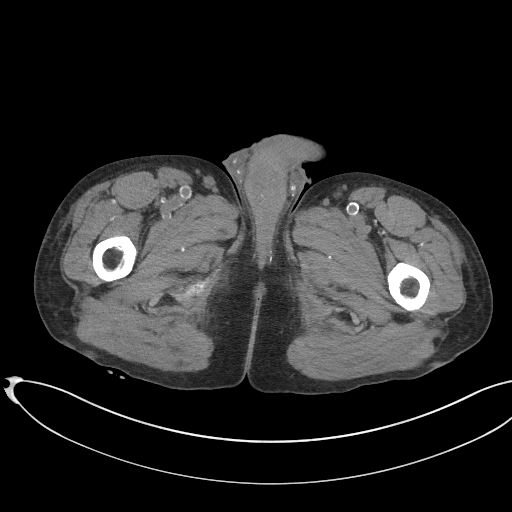
[im 4/93  bone]
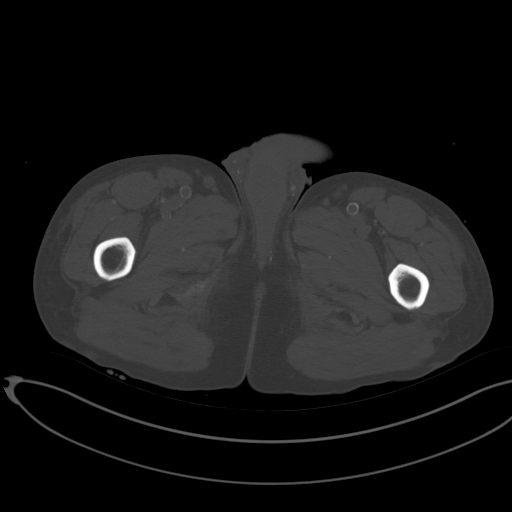
[im 12/93  soft-tissue]
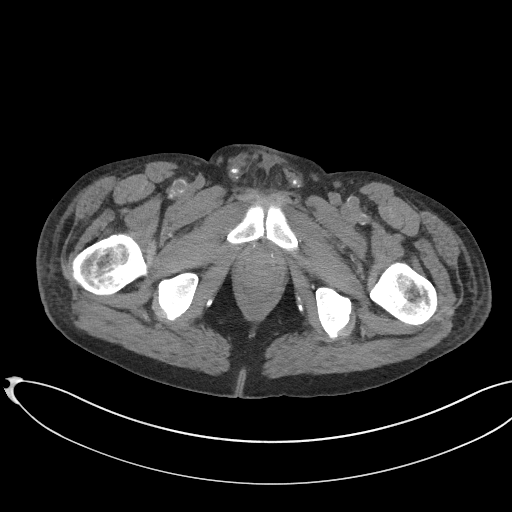
[im 20/93  soft-tissue]
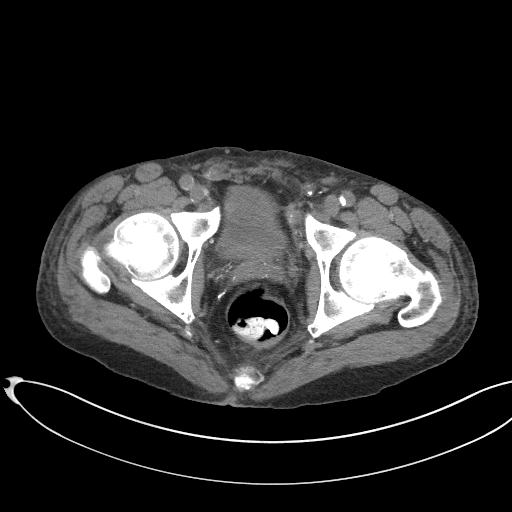
[im 27/93  soft-tissue]
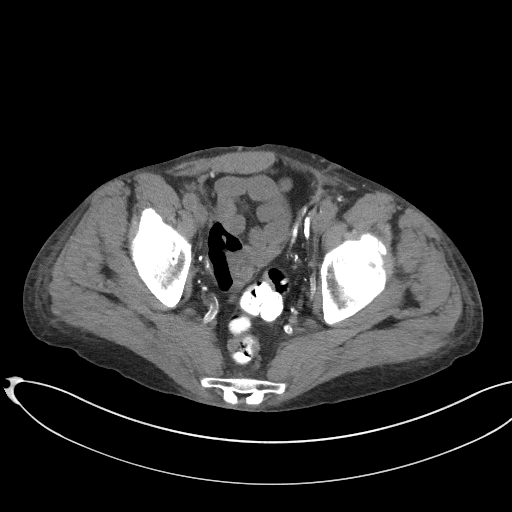
[im 35/93  soft-tissue]
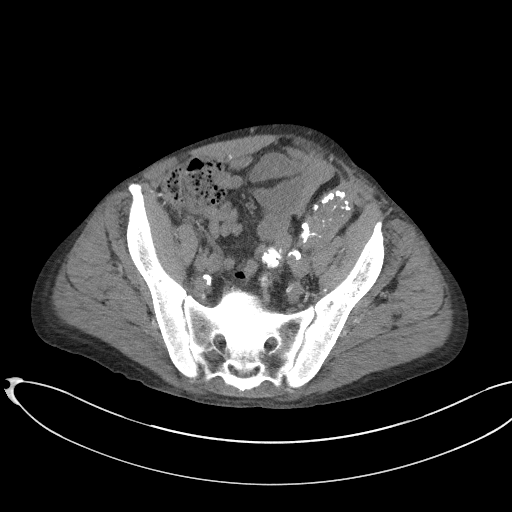
[im 43/93  soft-tissue]
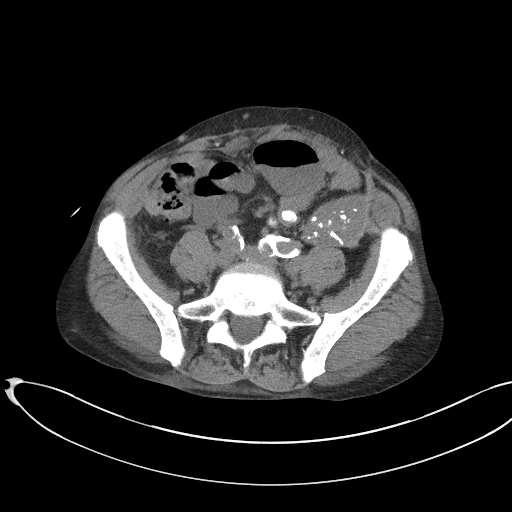
[im 50/93  soft-tissue]
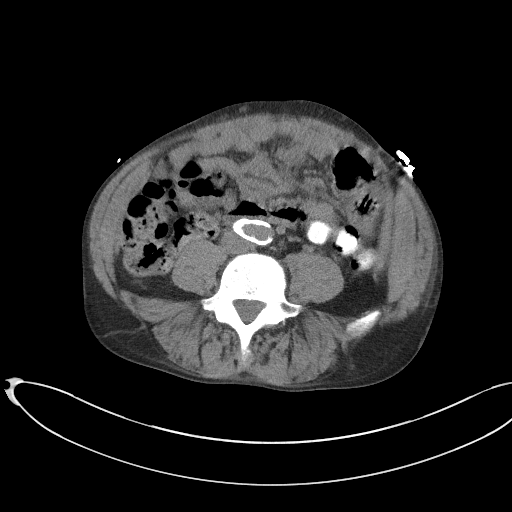
[im 58/93  soft-tissue]
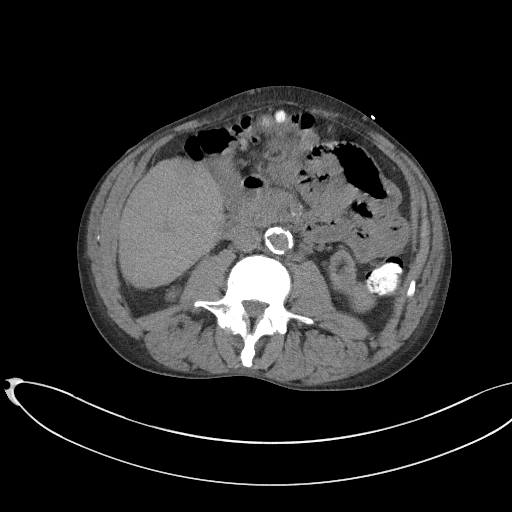
[im 66/93  soft-tissue]
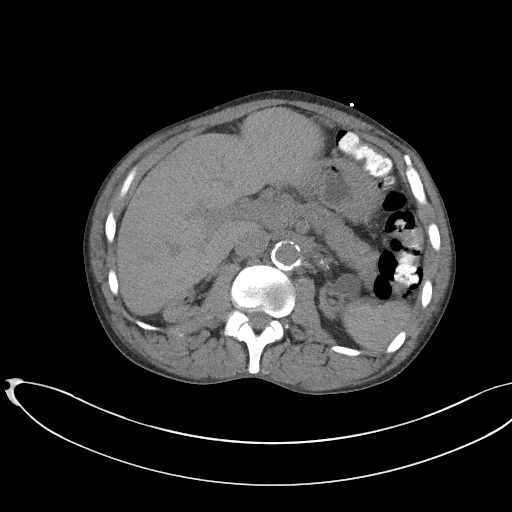
[im 66/93  bone]
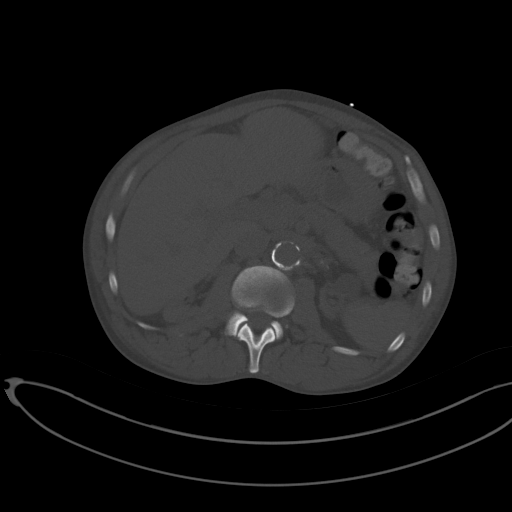
[im 73/93  soft-tissue]
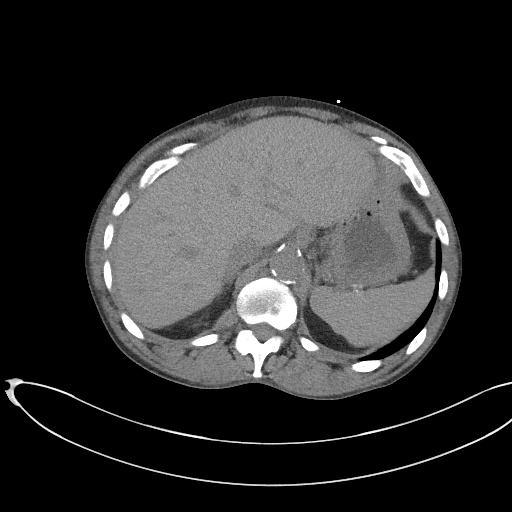
[im 81/93  soft-tissue]
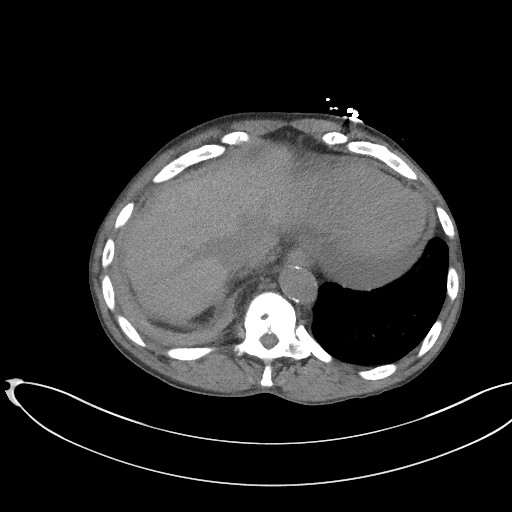
[im 89/93  soft-tissue]
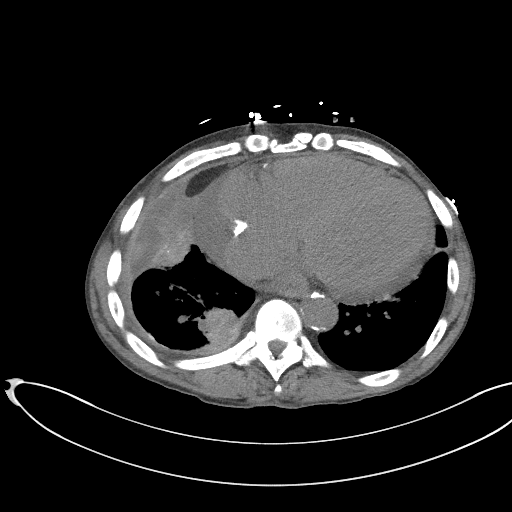

[Series 5: coronal st · coronal · 0.72mm/px · 3 of 99 slices shown]
[im 33/99  soft-tissue]
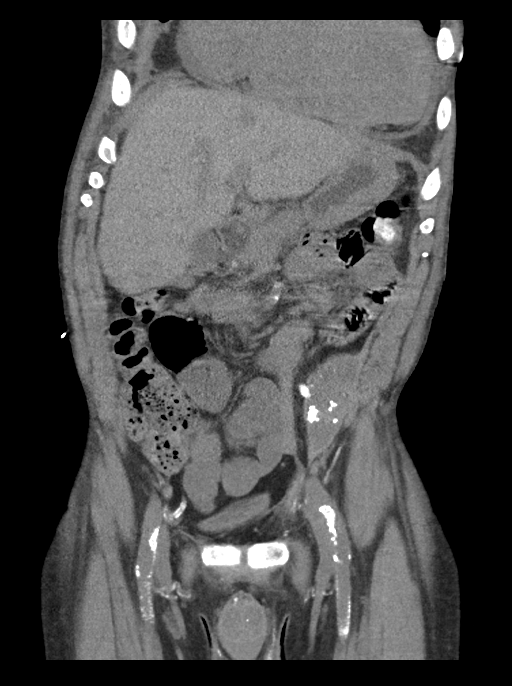
[im 44/99  soft-tissue]
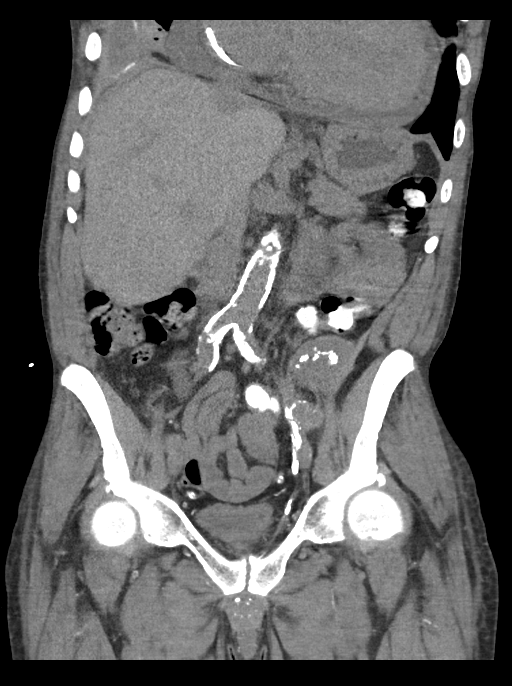
[im 55/99  soft-tissue]
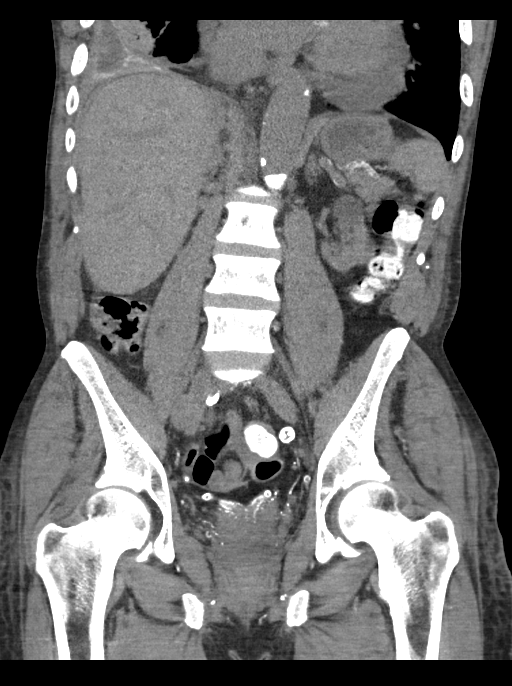

[15 of 46 positions shown; findings below may reference images not displayed]

FINDINGS: Lower chest: Again noted is a chronic right-sided pleural effusion
with associated calcifications.The heart size is significantly
enlarged. There is a moderate-sized pericardial effusion which has
increased in size from prior study.

Hepatobiliary: The liver is normal. Normal gallbladder.There is no
biliary ductal dilation.

Pancreas: Normal contours without ductal dilatation. No
peripancreatic fluid collection.

Spleen: No splenic laceration or hematoma.

Adrenals/Urinary Tract:

--Adrenal glands: No adrenal hemorrhage.

--Right kidney/ureter: The right kidney is atrophic.

--Left kidney/ureter: The left kidney is atrophic. Again noted is a
hemorrhagic cyst involving the left kidney. There is a calcified
transplant kidney in the left pelvis.

--Urinary bladder: The urinary bladder is decompressed and therefore
is poorly evaluated.

Stomach/Bowel:

--Stomach/Duodenum: No hiatal hernia or other gastric abnormality.
Normal duodenal course and caliber.

--Small bowel: There are few mildly dilated loops of small bowel in
the abdomen without evidence for a small bowel obstruction.

--Colon: No focal abnormality.

--Appendix: Not visualized. No right lower quadrant inflammation or
free fluid.

Vascular/Lymphatic: Atherosclerotic calcification is present within
the non-aneurysmal abdominal aorta, without hemodynamically
significant stenosis.

--No retroperitoneal lymphadenopathy.

--No mesenteric lymphadenopathy.

--No pelvic or inguinal lymphadenopathy.

Reproductive: Unremarkable

Other: No ascites or free air. The abdominal wall is normal.

Musculoskeletal. No acute displaced fractures. Renal osteodystrophy
is noted.
IMPRESSION: 1. No acute intra-abdominal abnormality detected.
2. Multiple chronic findings as detailed above.

## 2021-12-01 IMAGING — DX DG CHEST 2V
2 series · 2 of 2 positions shown · non-contrast
Comparison: Chest radiograph dated 06/03/2019.

CLINICAL DATA: 53-year-old male with chest pain.

EXAM:
CHEST - 2 VIEW

[chest lat]
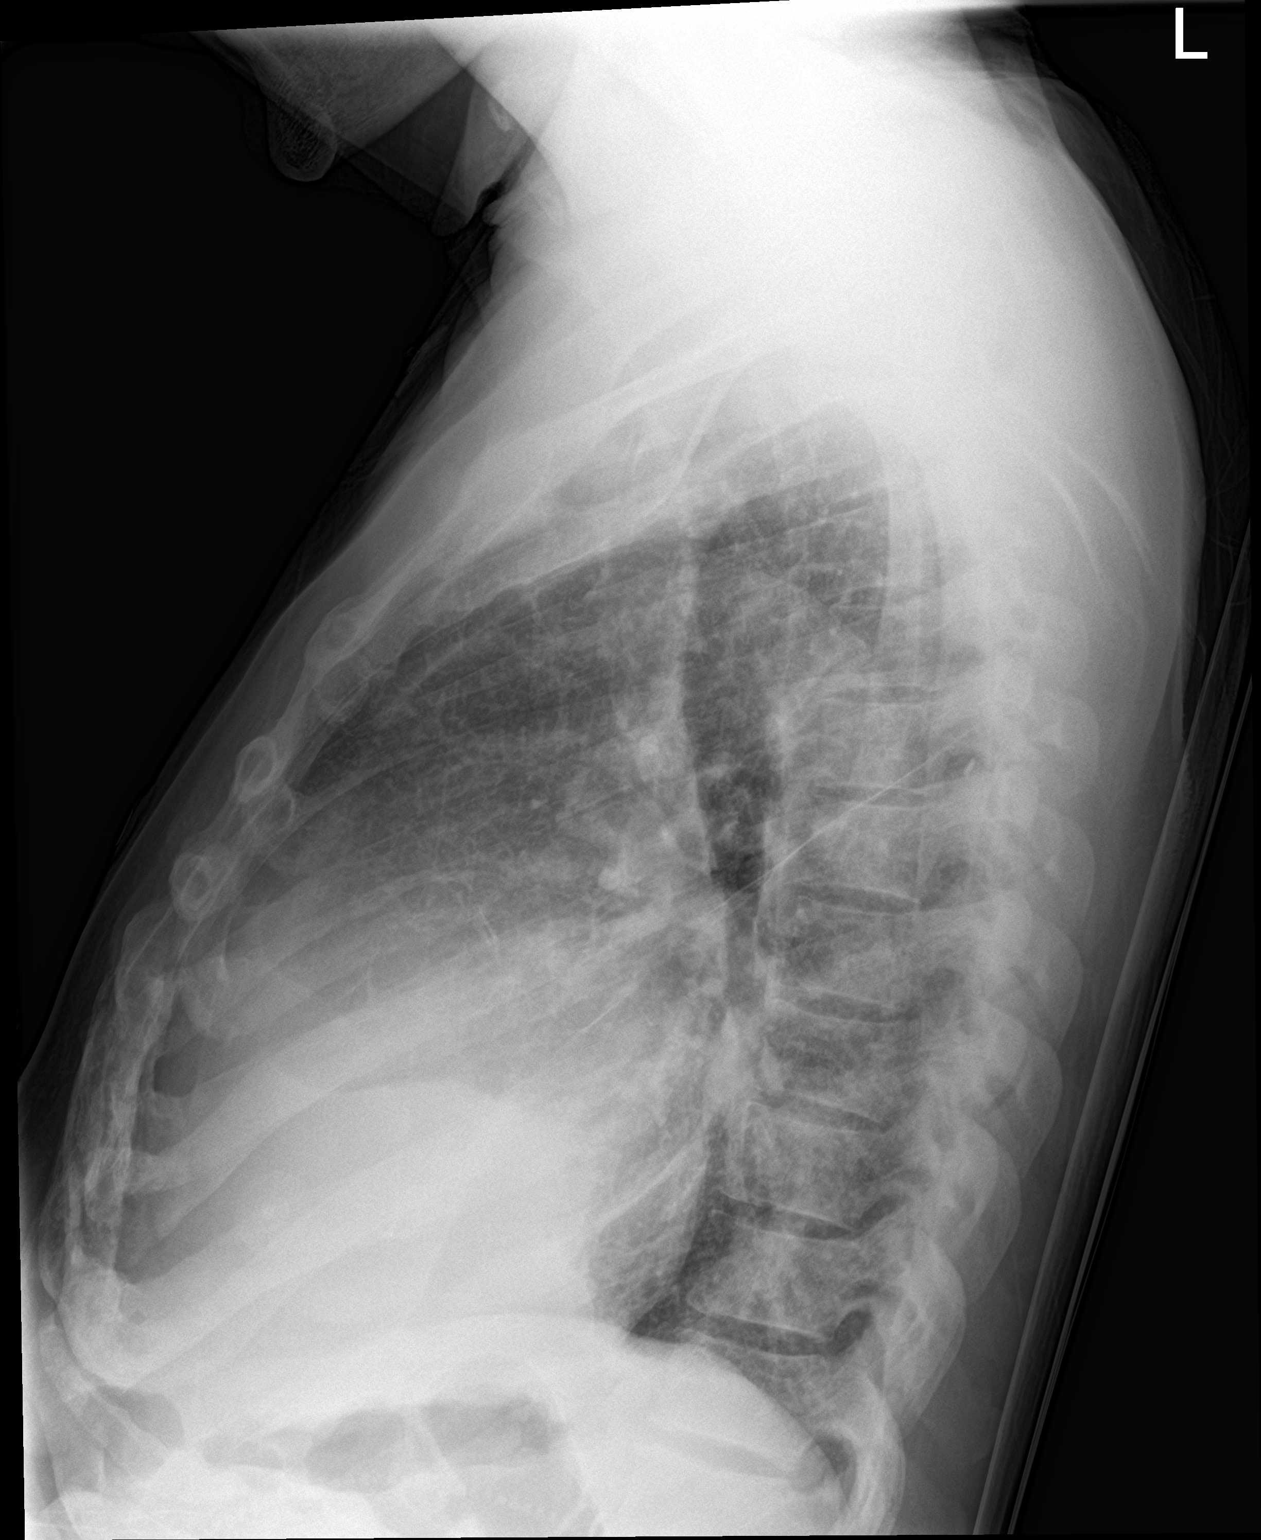

[chest ap]
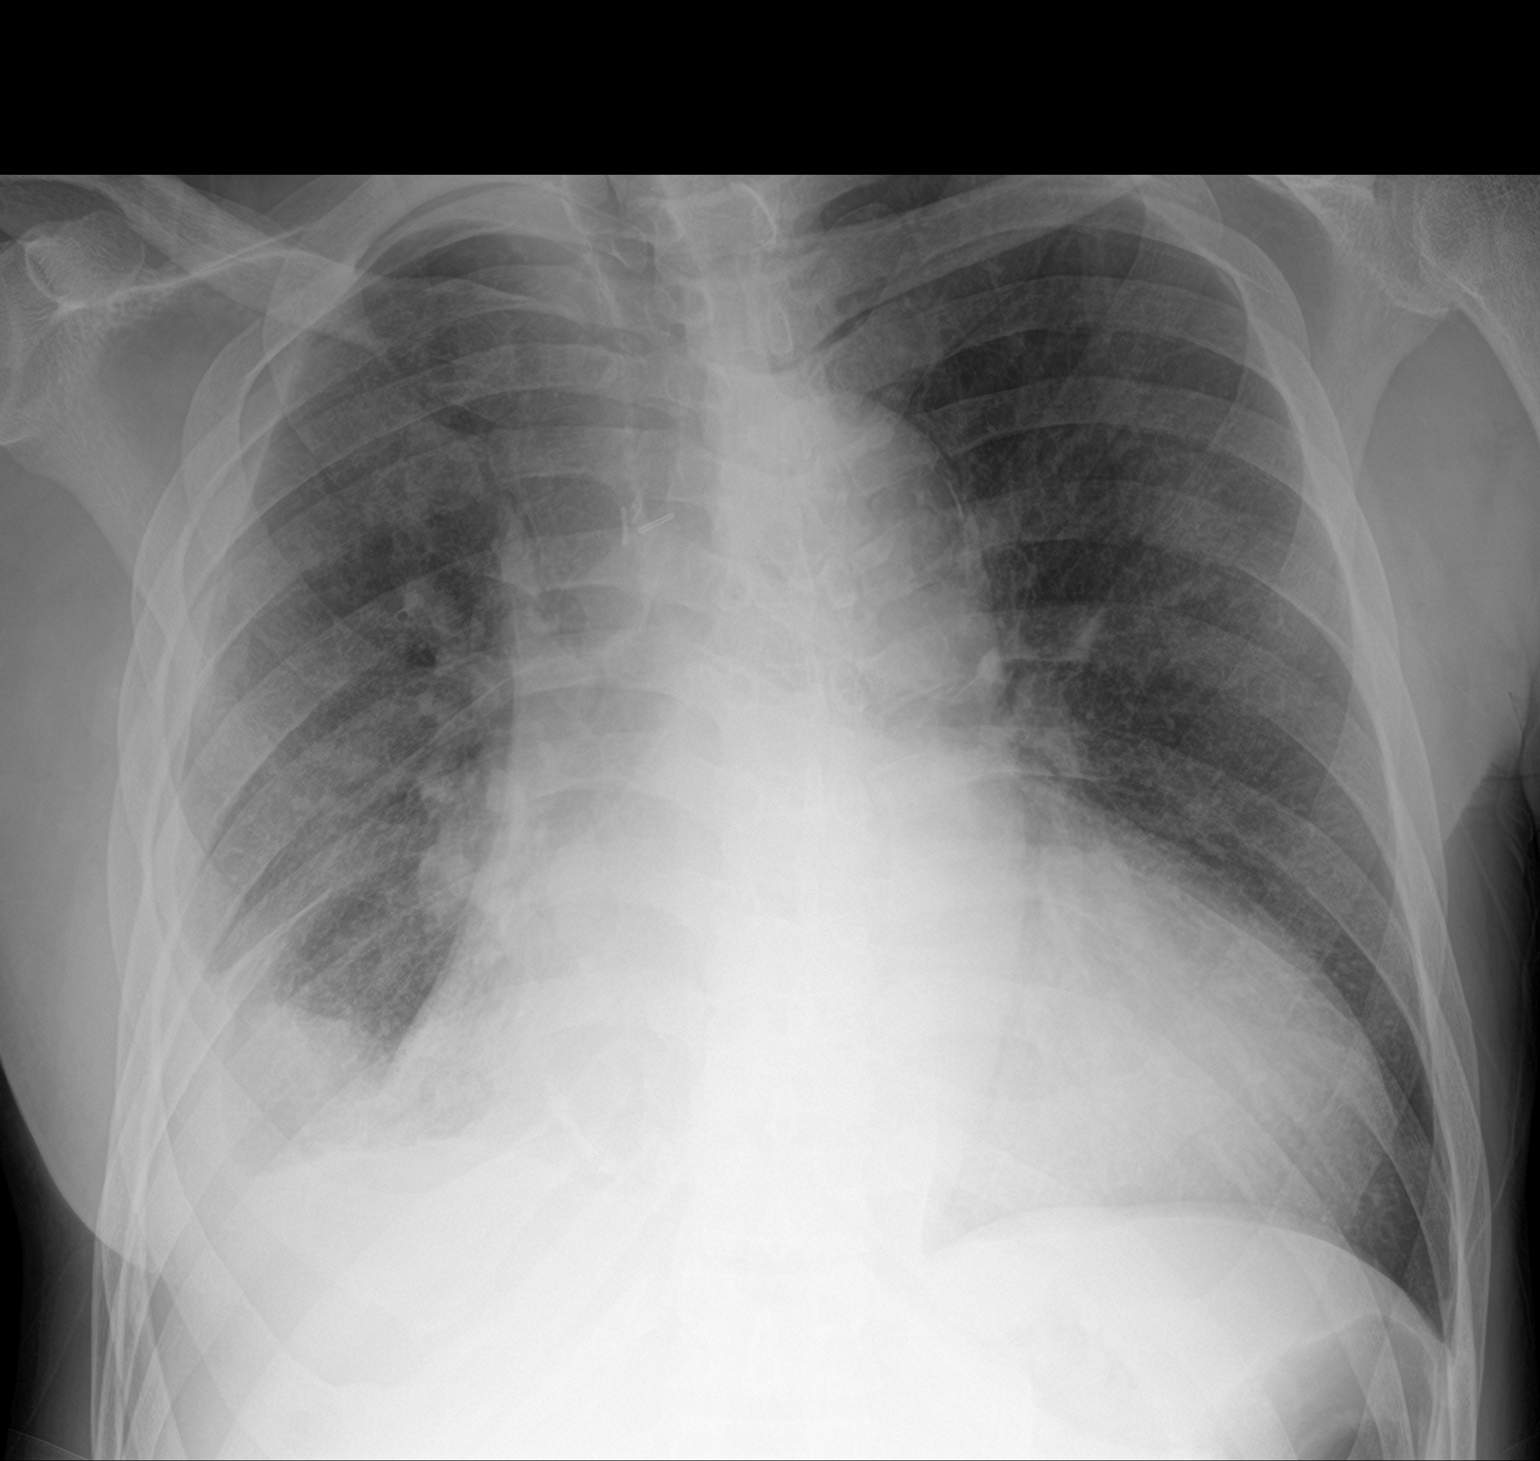

[2 of 2 positions shown; findings below may reference images not displayed]

FINDINGS: There is moderate severe cardiomegaly. Small to moderate right
pleural effusion similar to prior radiograph with associated right
lung base atelectasis. Infiltrate is not excluded. Clinical
correlation is recommended. The left lung is clear. No pneumothorax.
No acute osseous pathology.
IMPRESSION: 1. Stable small to moderate right pleural effusion with associated
right lung base atelectasis. Pneumonia is not excluded.
2. Stable moderate cardiomegaly.

## 2021-12-05 IMAGING — CR DG CHEST 2V
2 series · 2 of 2 positions shown · non-contrast
Comparison: 07/13/2019 chest radiographs and earlier.

CLINICAL DATA: 53-year-old male with chest pain and pressure.
Episodes of vomiting.

EXAM:
CHEST - 2 VIEW

[w chest pa]
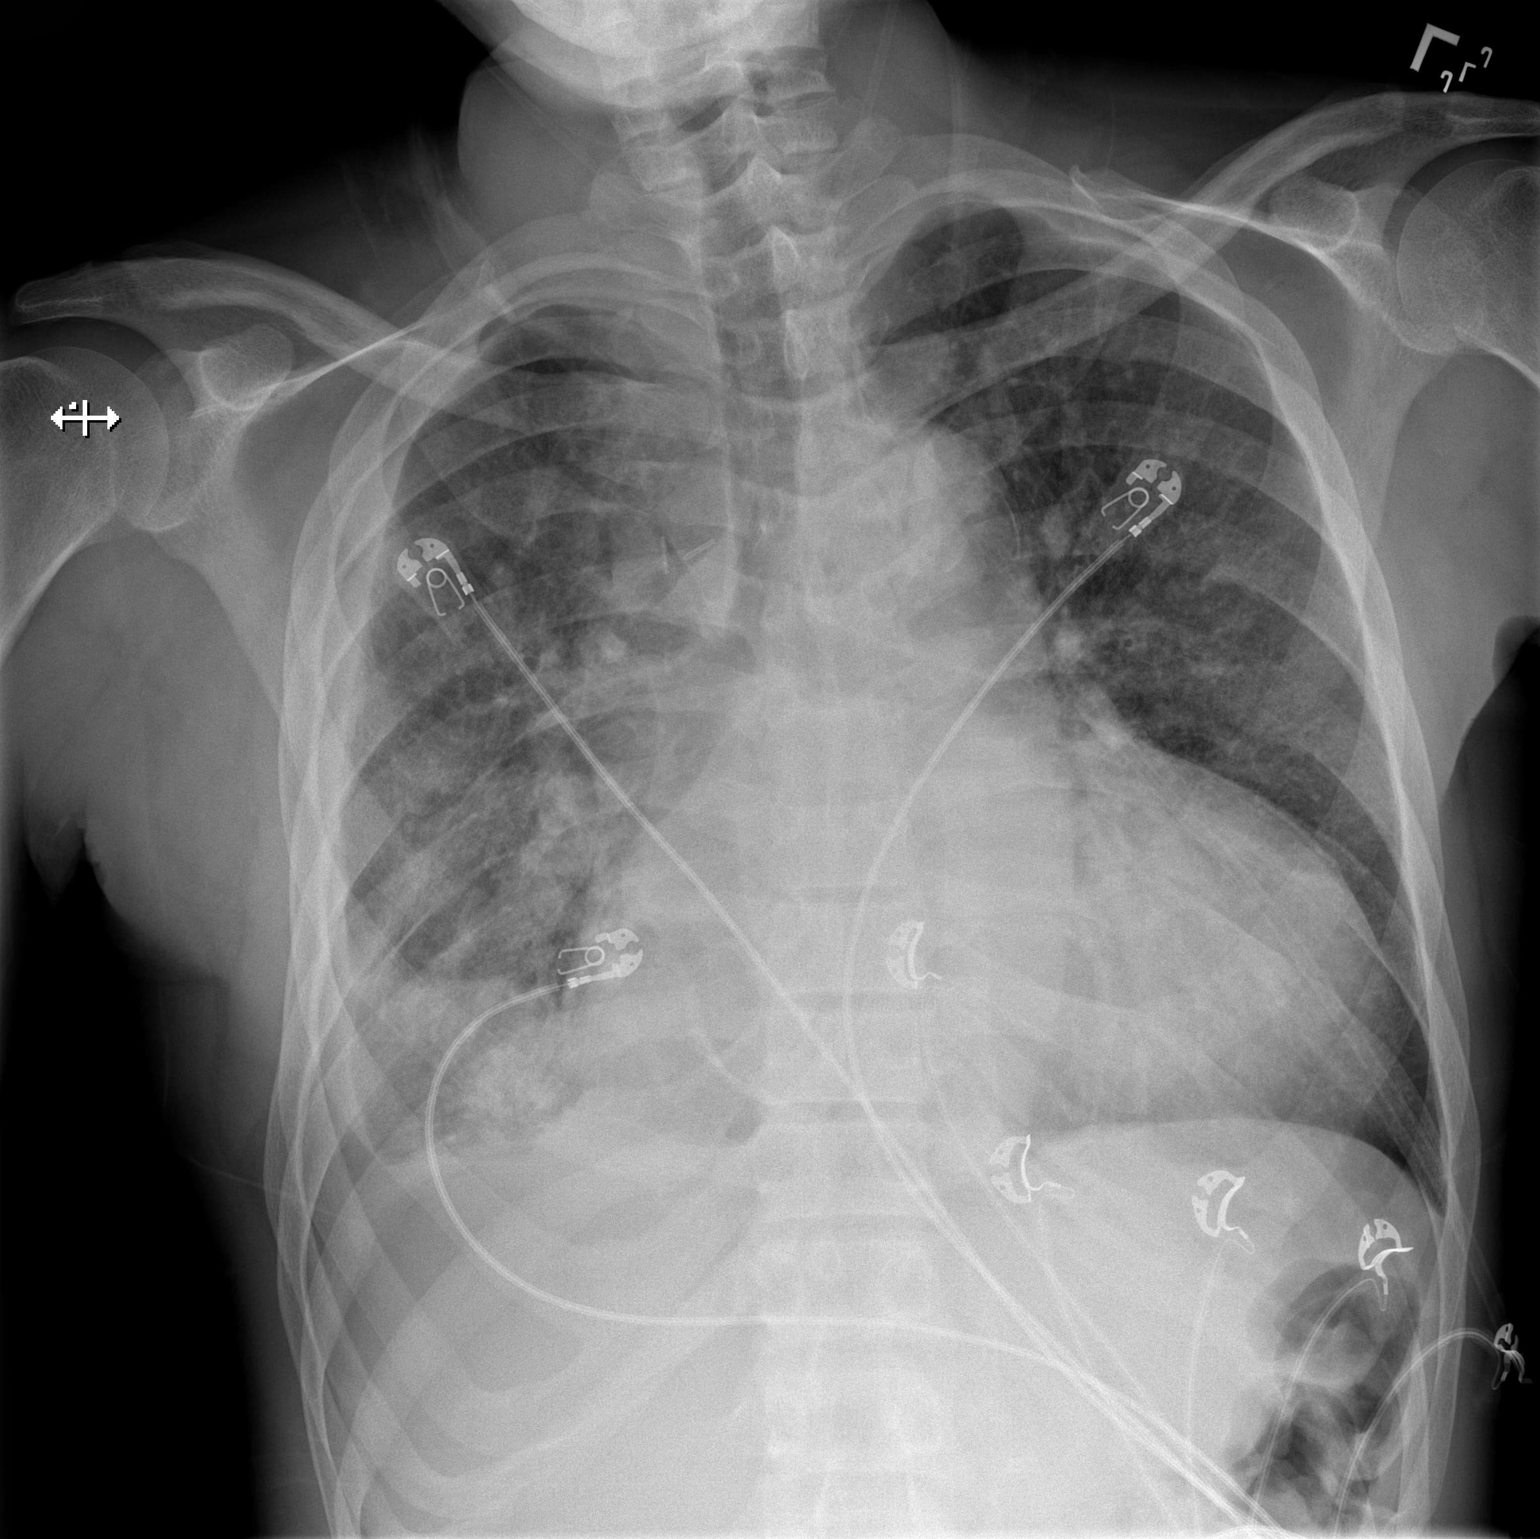

[w chest lat]
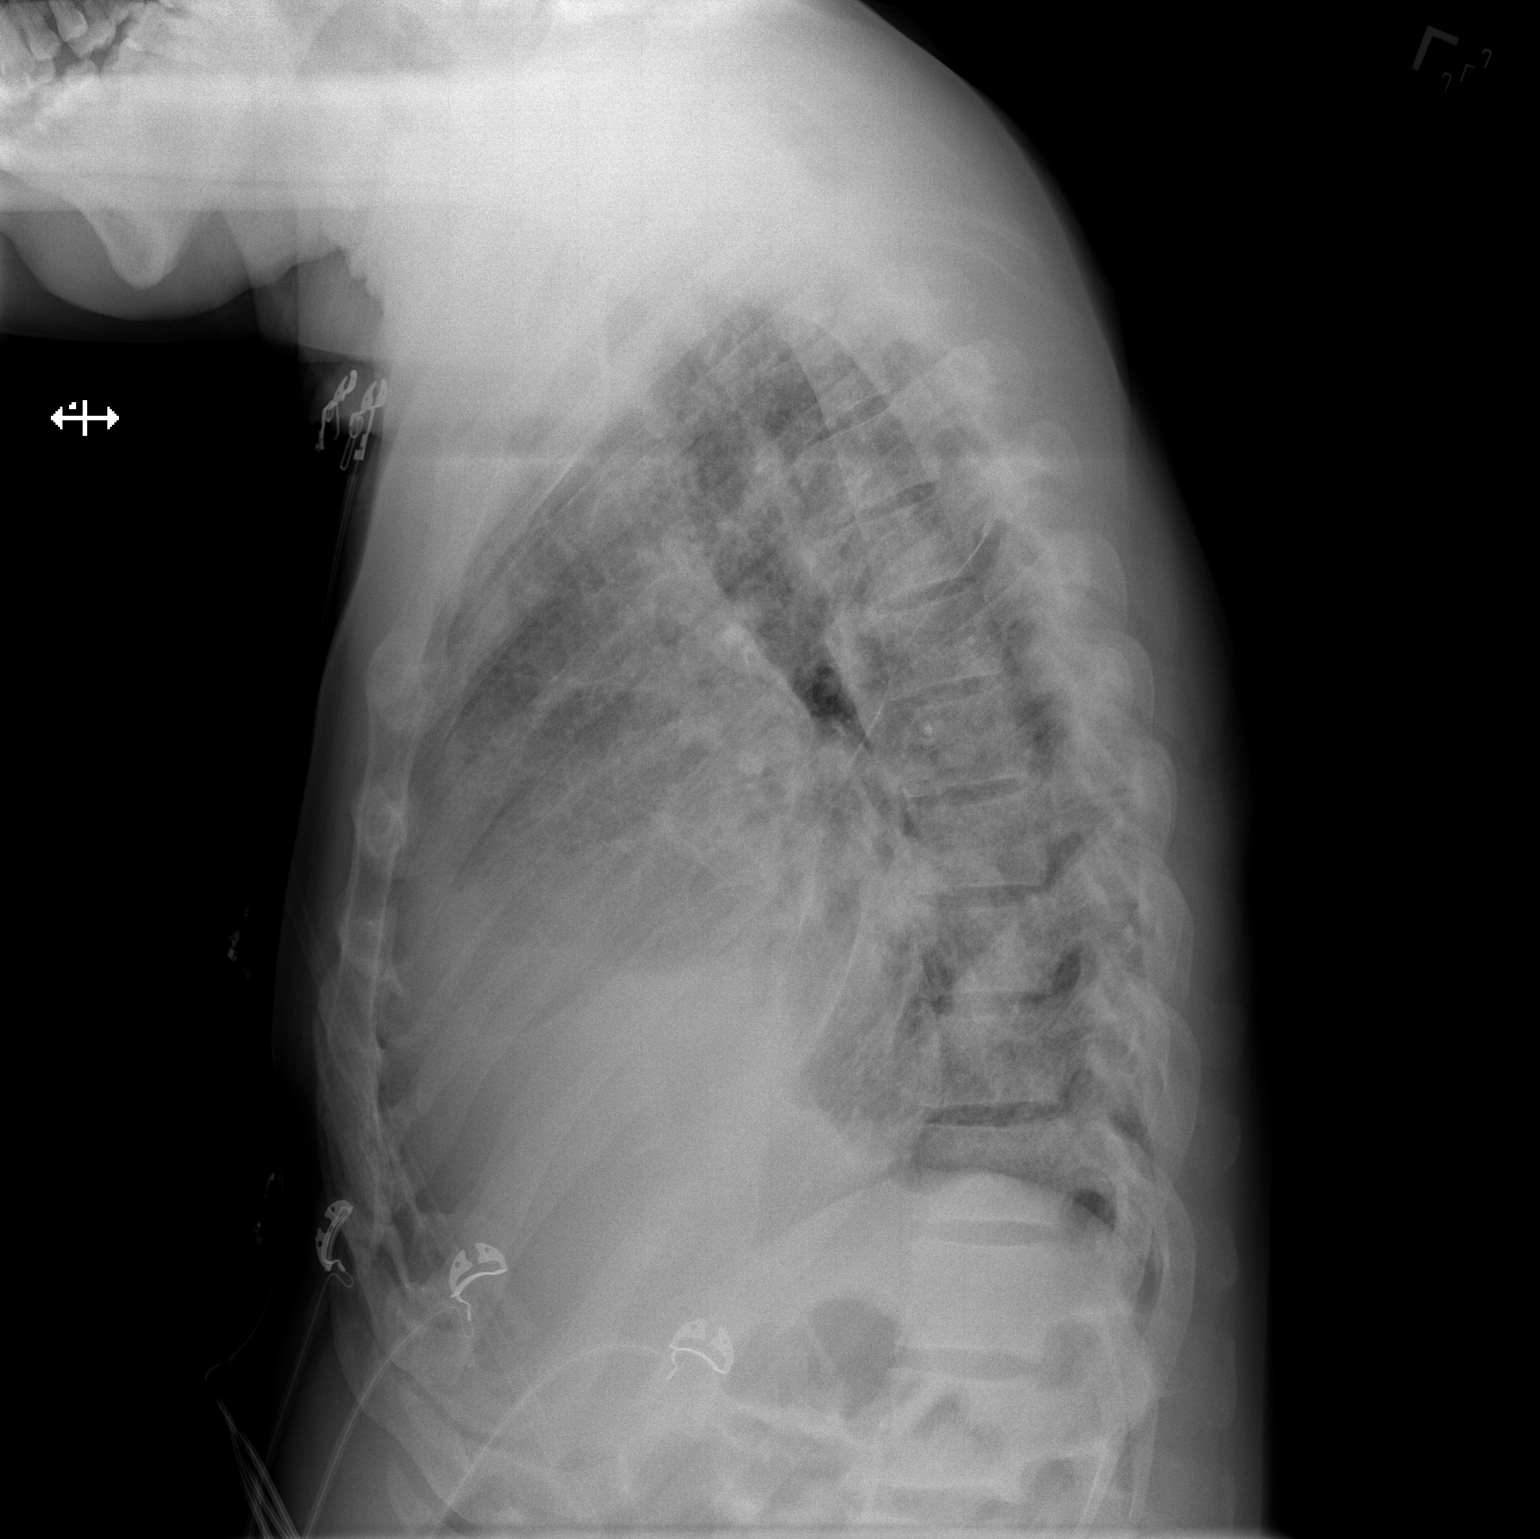

[2 of 2 positions shown; findings below may reference images not displayed]

FINDINGS: Moderate to severe cardiomegaly and mediastinal contours are stable.
Decreased volume in the right lung with peripheral pleural
thickening versus loculated effusion and patchy basilar predominant
opacity, not significantly changed since [REDACTED]. The left lung
remains clear. Visualized tracheal air column is within normal
limits. No superimposed pneumothorax or worsening ventilation.

No acute osseous abnormality identified. Negative visible bowel gas
pattern.
IMPRESSION: 1. Moderate to severe cardiomegaly appears stable. On a 11/30/2018
outside CT at least moderate pericardial effusion was present.
2. Continued decreased ventilation in the right lung related to
pleural thickening and/or loculated effusion, and basilar airspace
disease, also present on the prior CT.
3. No new cardiopulmonary abnormality.

## 2021-12-07 IMAGING — DX DG CHEST 2V
2 series · 2 of 2 positions shown · non-contrast
Comparison: Chest radiograph 07/17/2019

CLINICAL DATA: Chest pain/tightness and some abdominal pain for 2
days.

EXAM:
CHEST - 2 VIEW

[w chest pa]
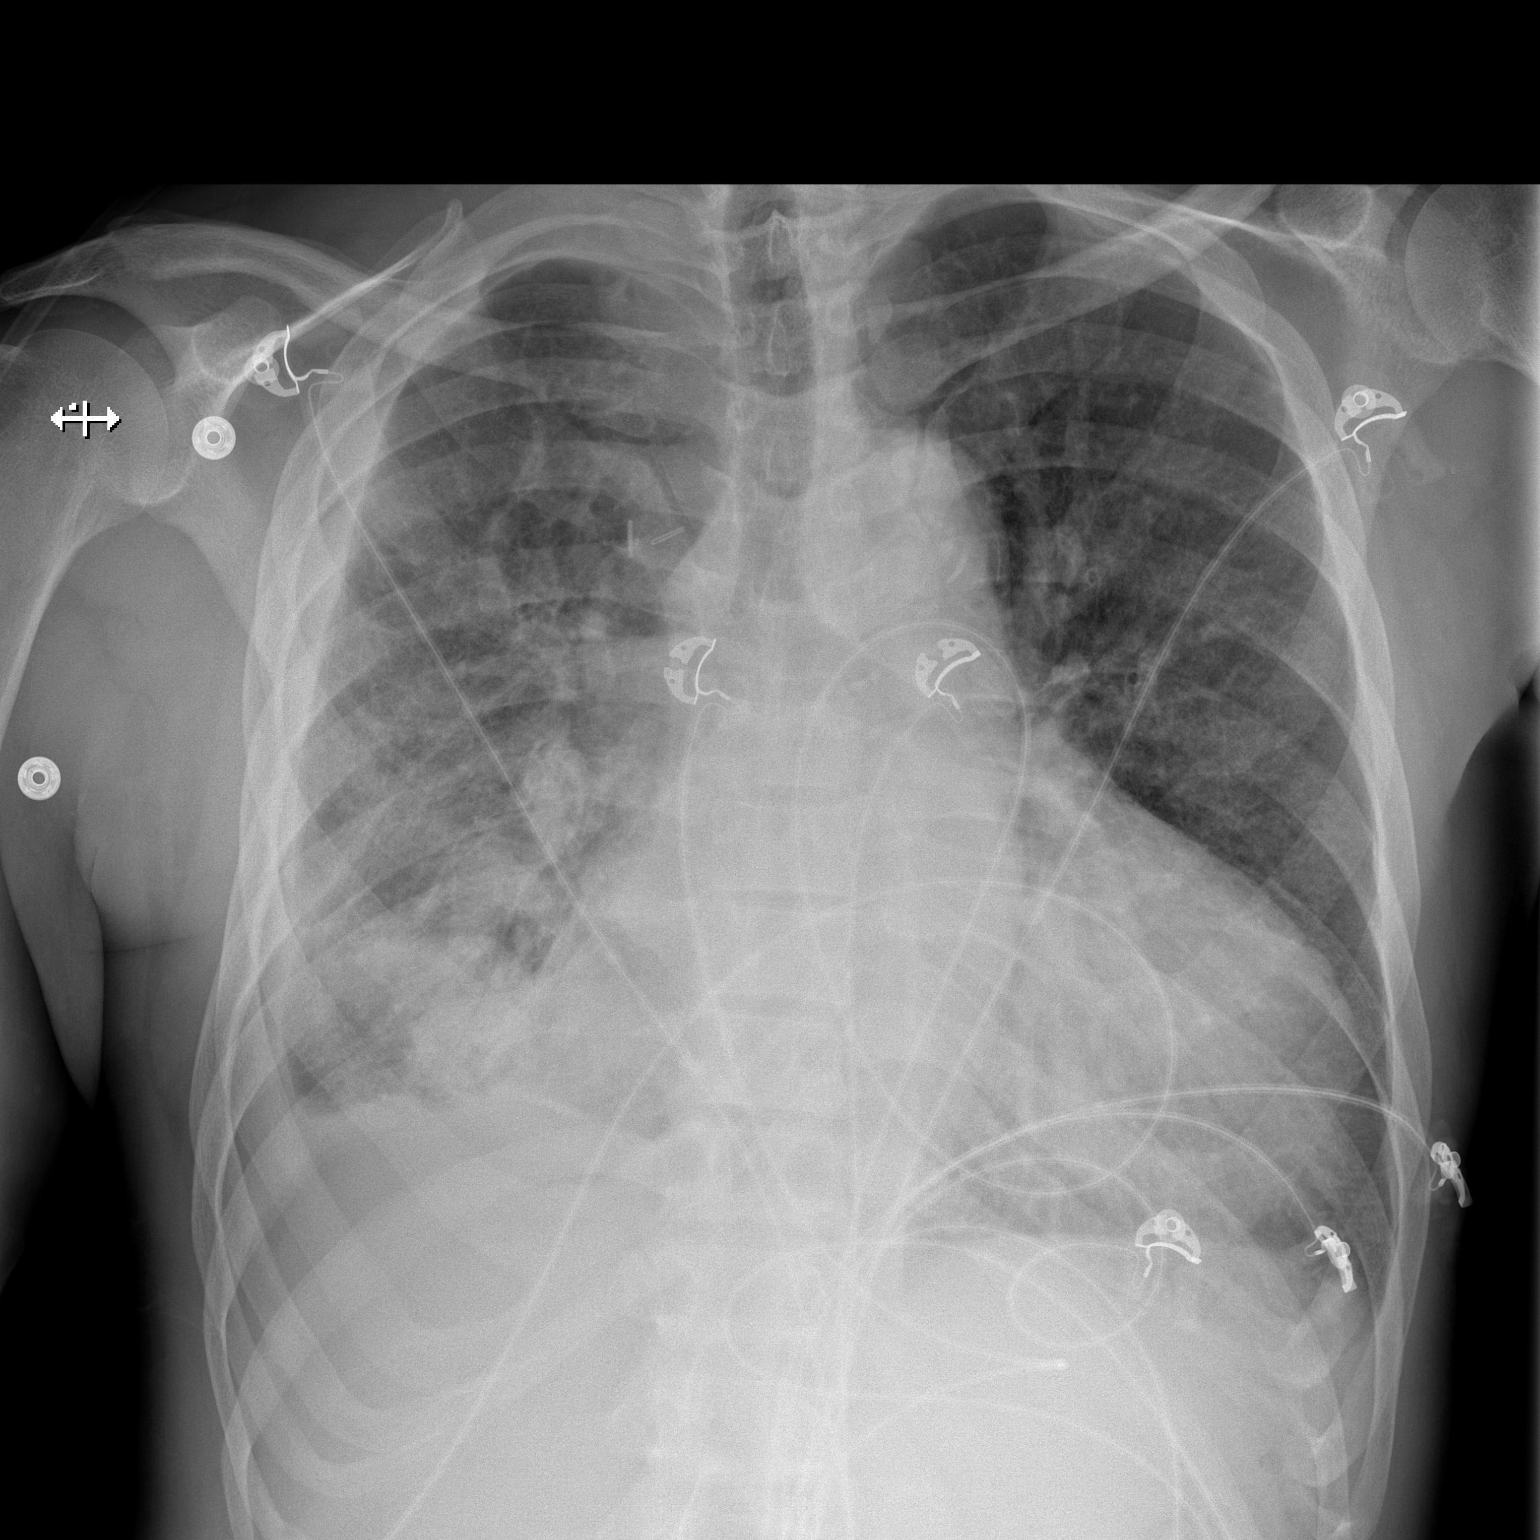

[w chest lat]
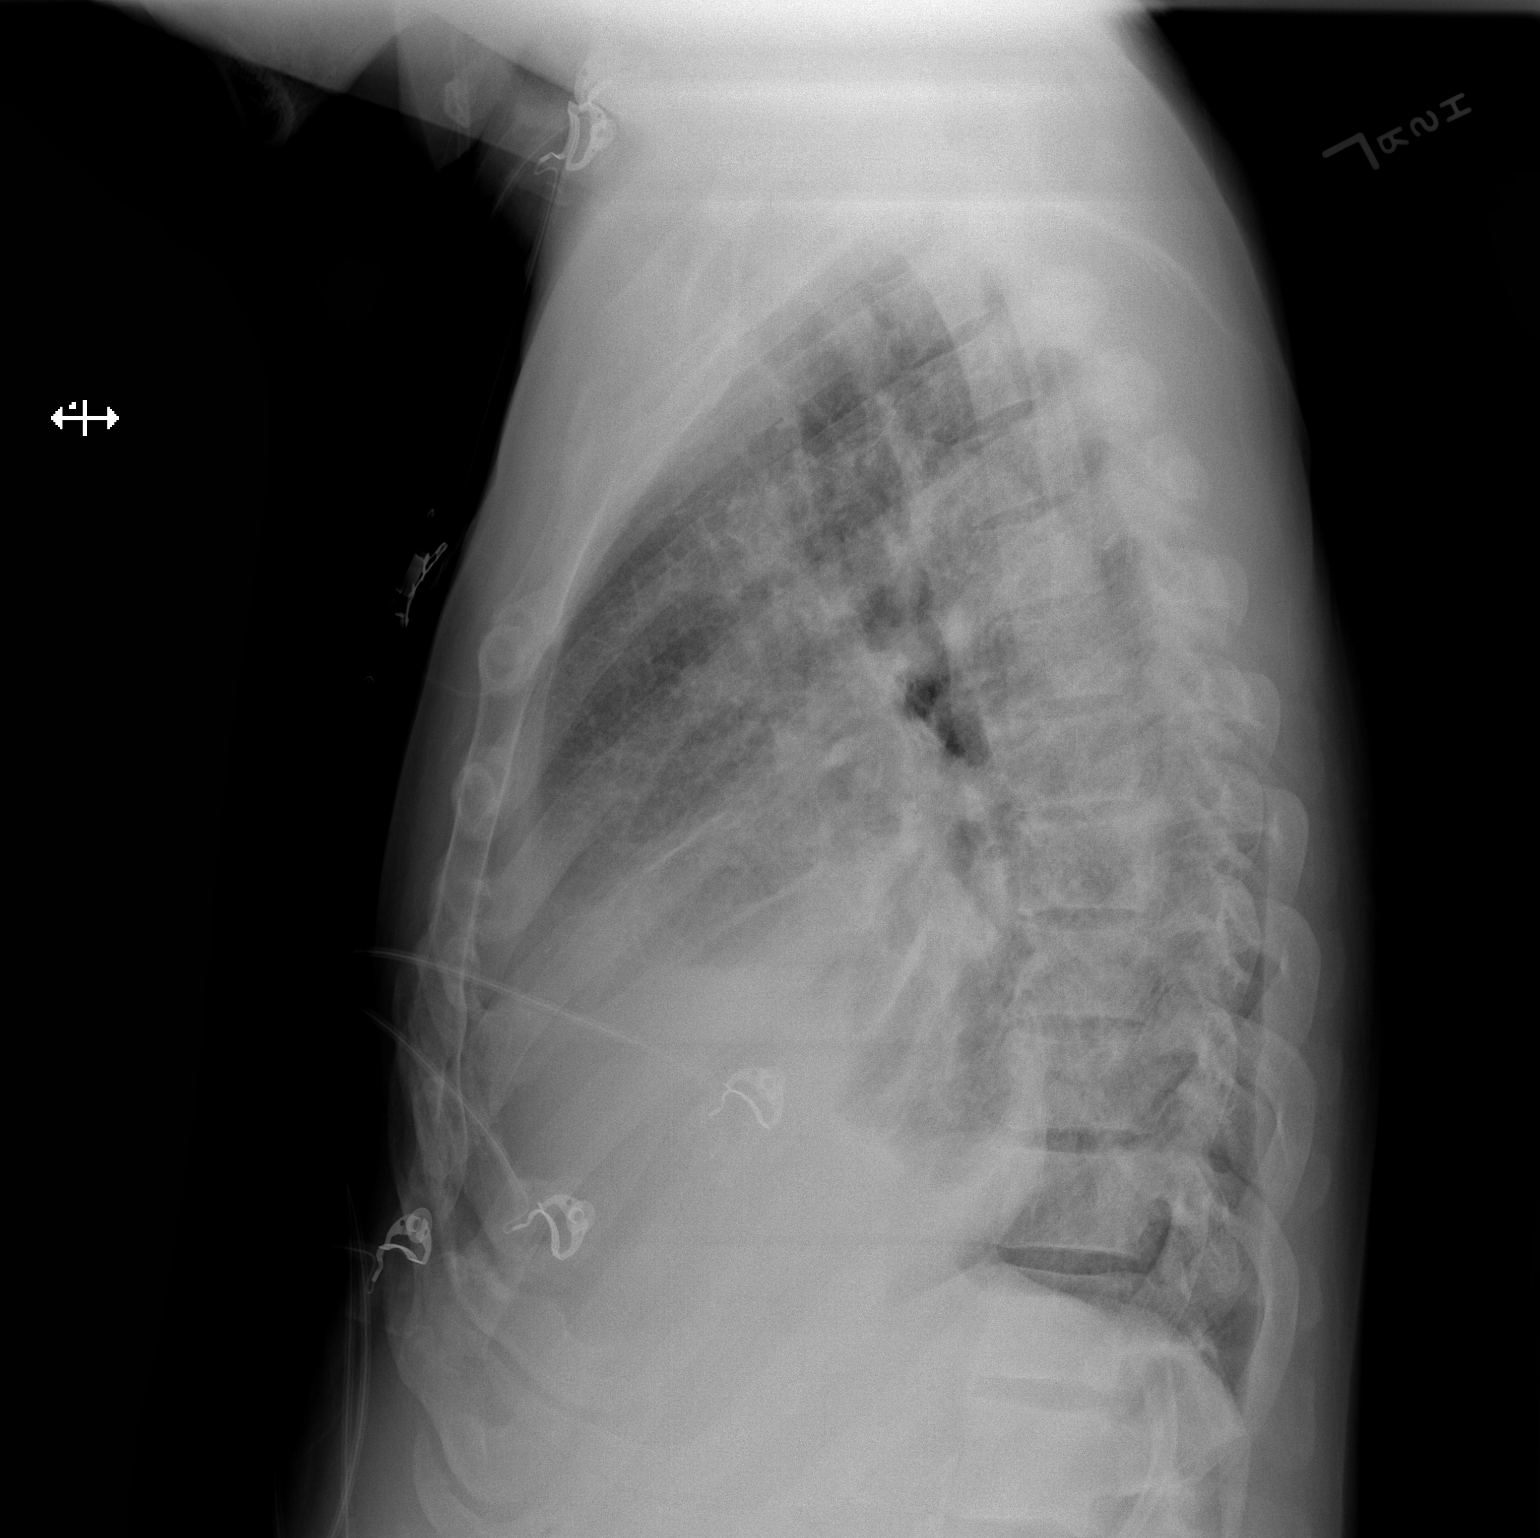

[2 of 2 positions shown; findings below may reference images not displayed]

FINDINGS: Redemonstrated moderate to severe cardiomegaly. Aortic
atherosclerosis. Persistent decreased volume of the right lung with
peripheral pleural thickening versus loculated pleural effusion.
Similar appearance of patchy basilar predominant opacity within the
right lung. The left lung remains clear. No evidence of
pneumothorax. No acute bony abnormality. Overlying cardiac
monitoring leads.
IMPRESSION: No significant interval change as compared to chest radiograph
07/17/2019.

Moderate to severe cardiomegaly. Of note, a moderate-sized
pericardial effusion was present on recent prior CT examinations.

Similar appearance of right pleural thickening and/or loculated
effusion, as well as basilar predominant airspace disease within the
right lung.

## 2021-12-10 IMAGING — DX DG RIBS W/ CHEST 3+V*R*
4 series · 4 of 4 positions shown · non-contrast
Comparison: Three days ago

CLINICAL DATA: Chest wall pain

EXAM:
RIGHT RIBS AND CHEST - 3+ VIEW

[chest pa]
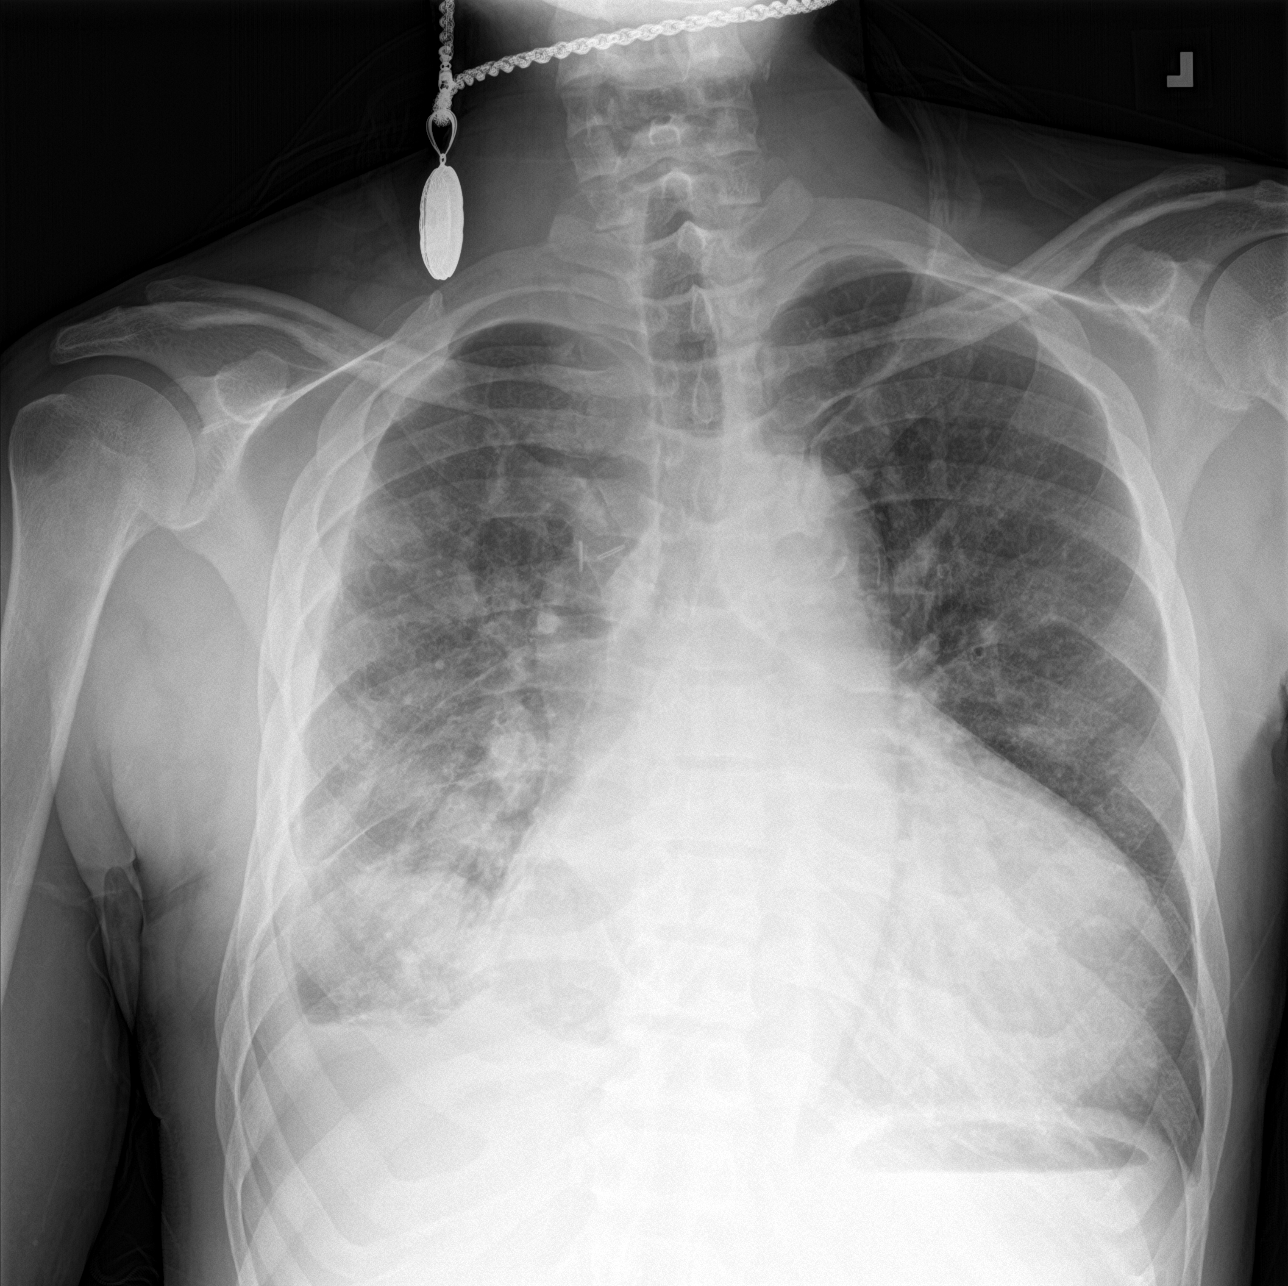

[rib pa]
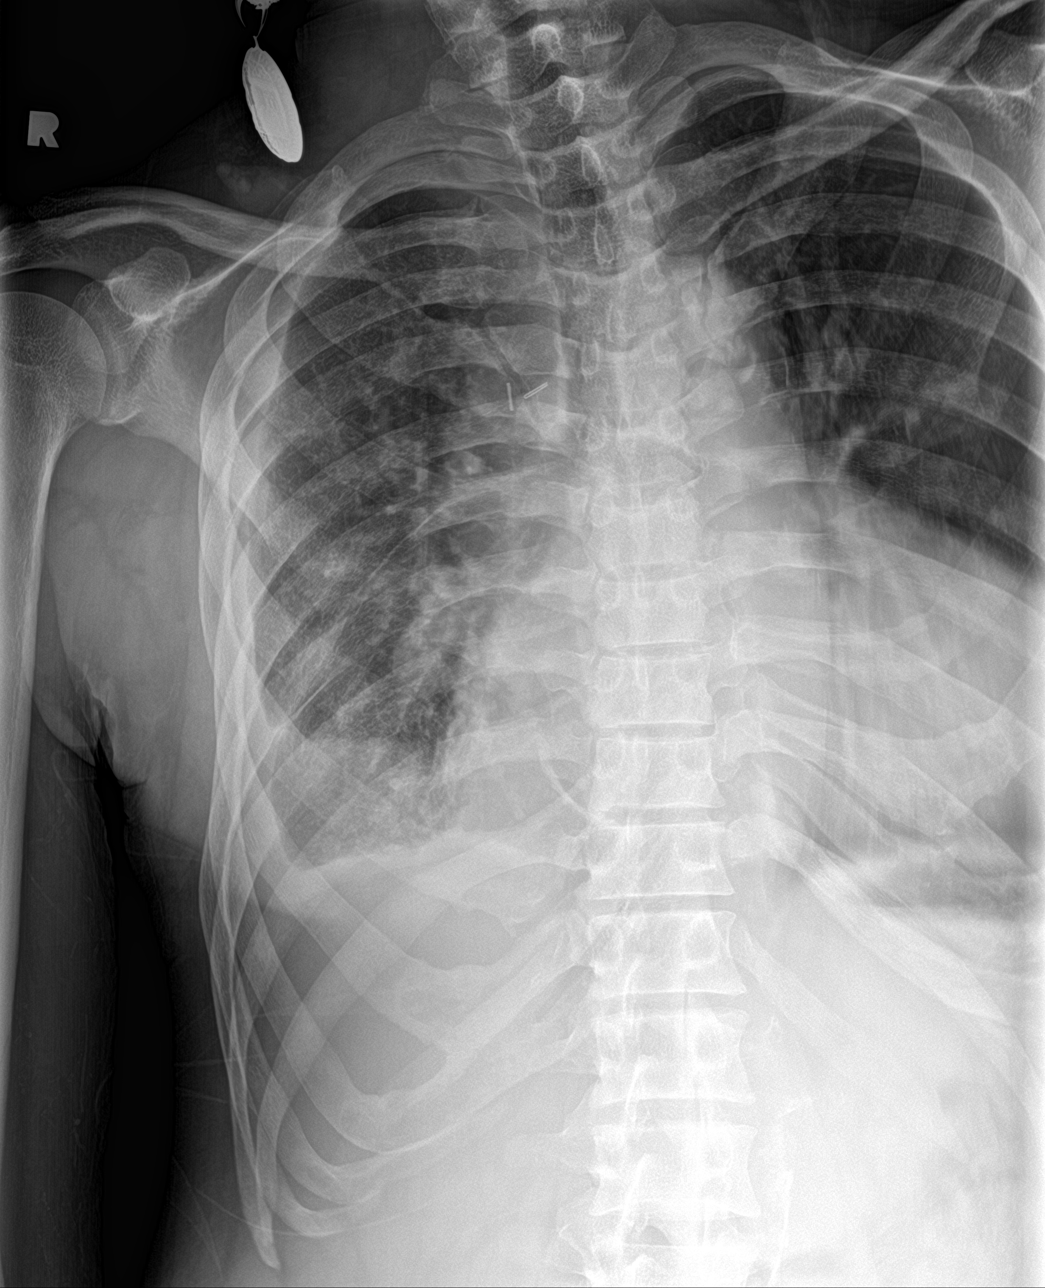

[rib pa obl (1 of 2)]
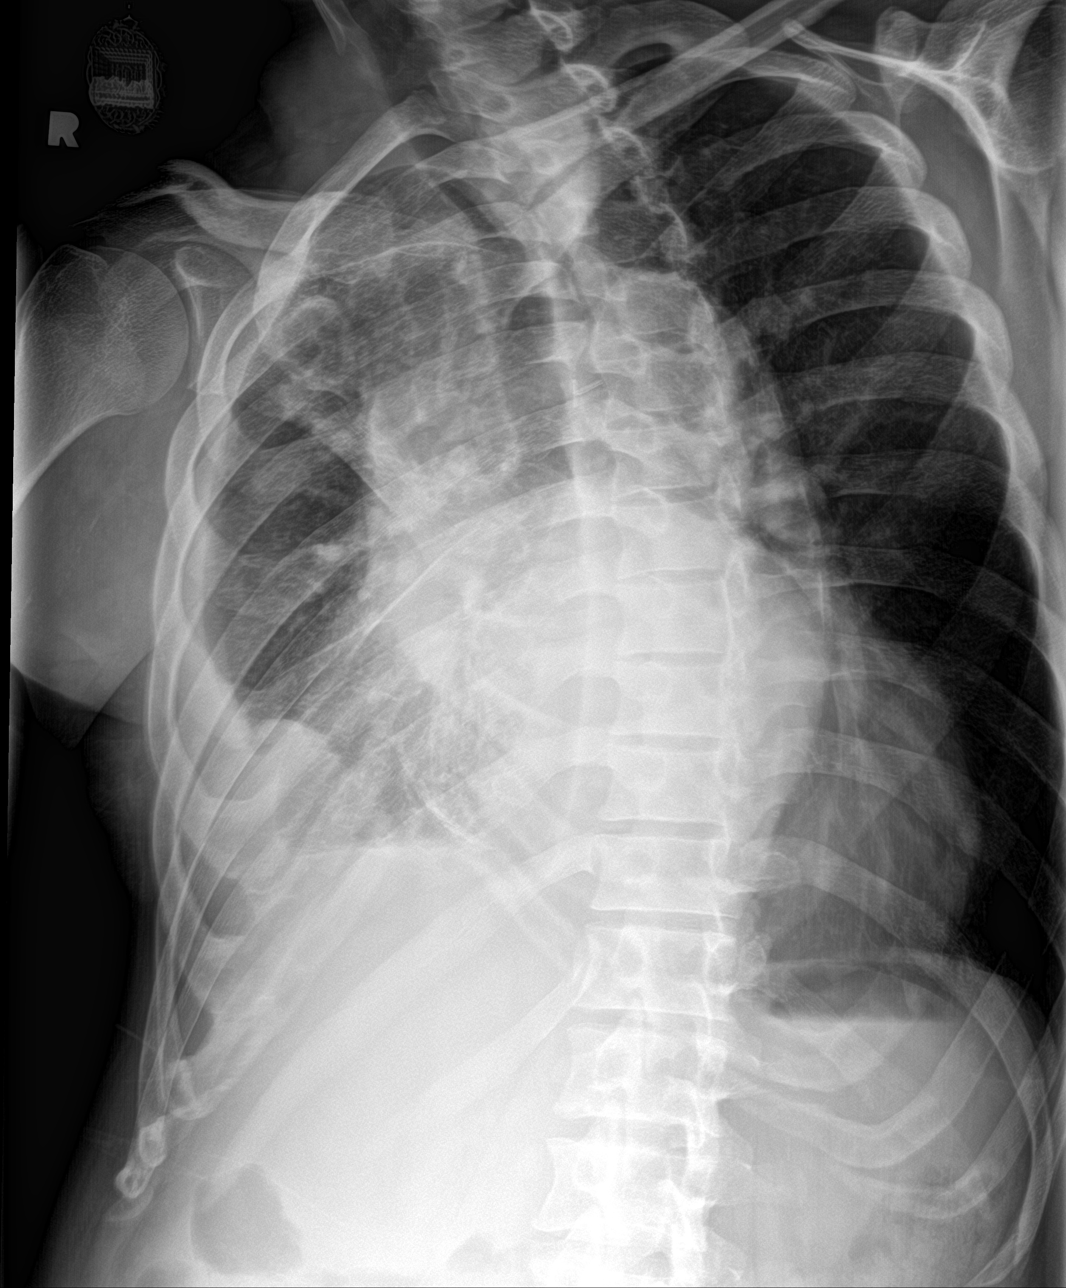

[rib pa obl (2 of 2)]
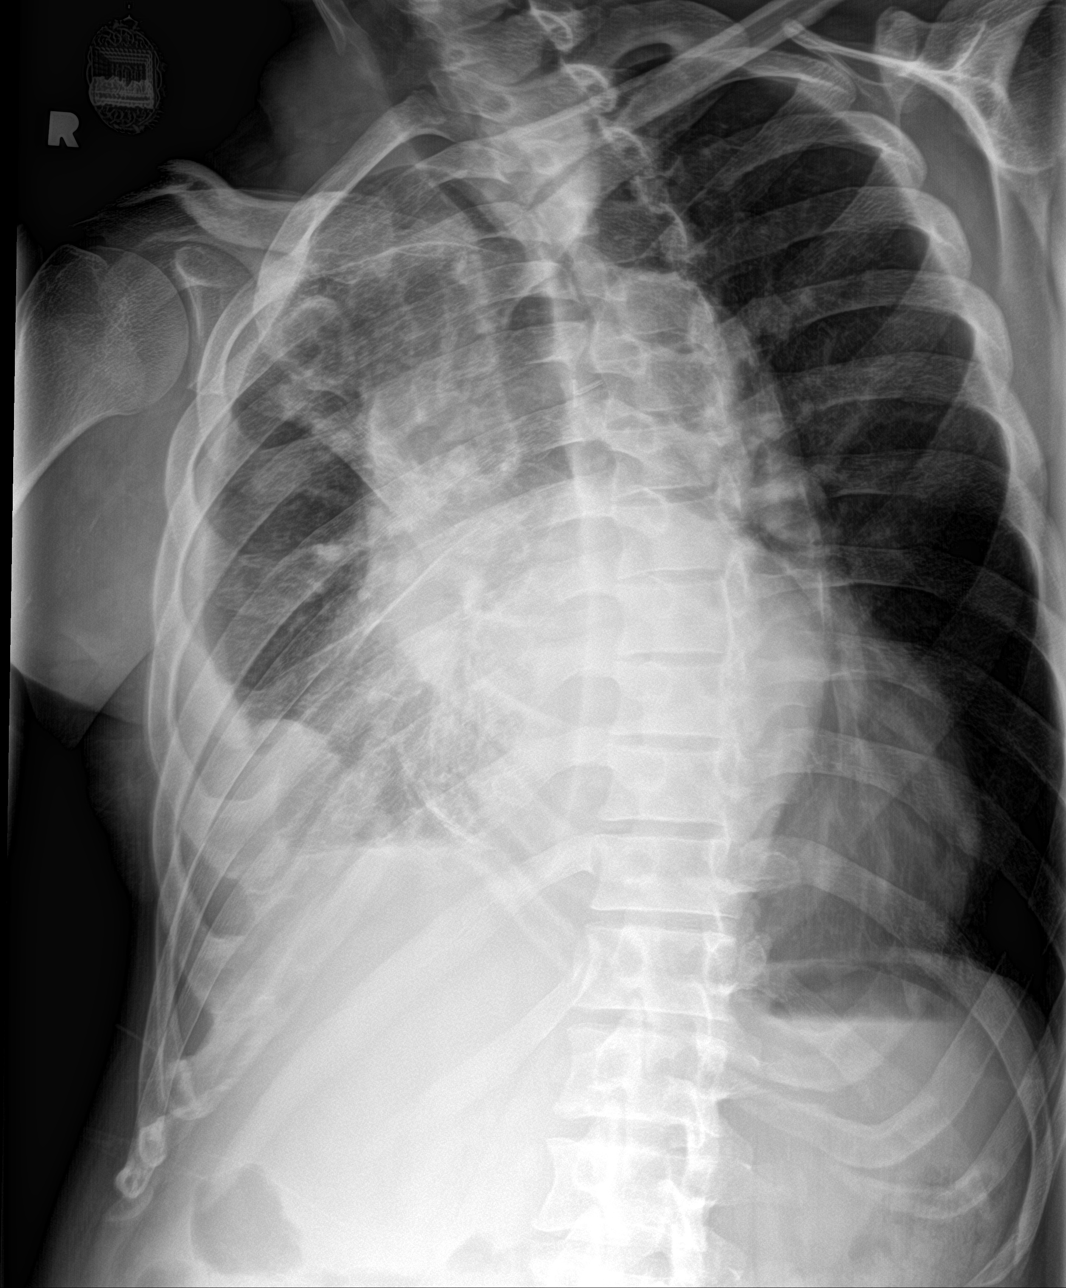

[4 of 4 positions shown; findings below may reference images not displayed]

FINDINGS: Chronic cardiomegaly and right pleural effusion with
loculations/pleural thickening. There is hazy opacity in the right
lower lung which has the appearance of round atelectasis on August 2018 CT. Chronic cardiomegaly and pericardial effusion. No rib
fracture or erosion.
IMPRESSION: 1. Negative right ribs.
2. Stable right complicated pleural effusion and round atelectasis.
3. Chronic cardiopericardial enlargement.

## 2021-12-28 IMAGING — DX DG CHEST 1V PORT
1 series · 1 of 1 positions shown · non-contrast
Comparison: July 26, 2019

CLINICAL DATA: History of pneumonia.  Vomiting last night.

EXAM:
PORTABLE CHEST 1 VIEW

[chest ap]
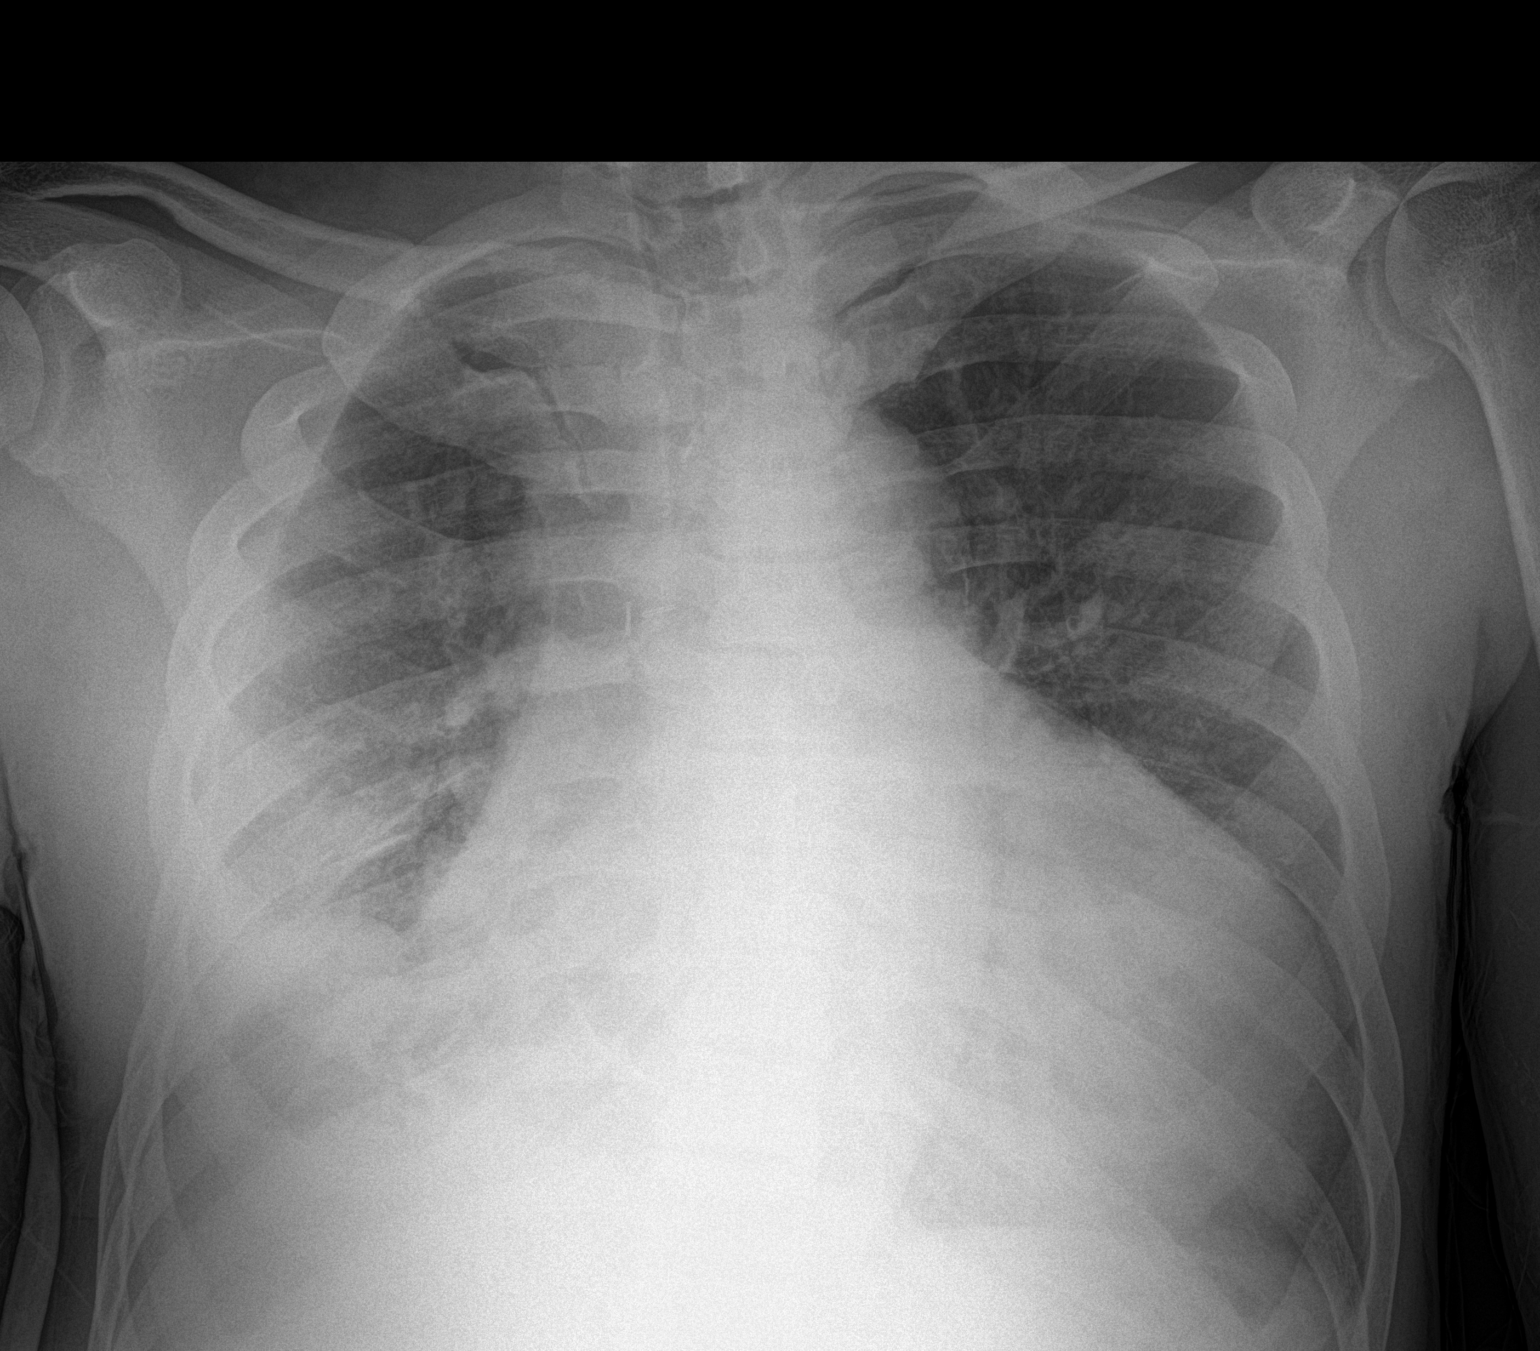

[1 of 1 positions shown; findings below may reference images not displayed]

FINDINGS: No pneumothorax. Stable cardiomegaly. The hila and mediastinum are
unchanged. A right-sided pleural effusion is identified, similar in
the interval. Opacity underlying the effusion is more focal in the
interval. Mild increased interstitial markings in the lungs.
IMPRESSION: 1. The right-sided pleural effusion is similar in the interval.
Opacity underlying the effusion in the right base is more focal and
prominent the interval, consistent with atelectasis or developing
infiltrate.
2. Cardiomegaly and pulmonary venous congestion.

## 2021-12-29 IMAGING — CR DG CHEST 2V
2 series · 2 of 2 positions shown · non-contrast
Comparison: Chest radiograph yesterday, additional priors

CLINICAL DATA: Shortness of breath.

EXAM:
CHEST - 2 VIEW

[chest lat]
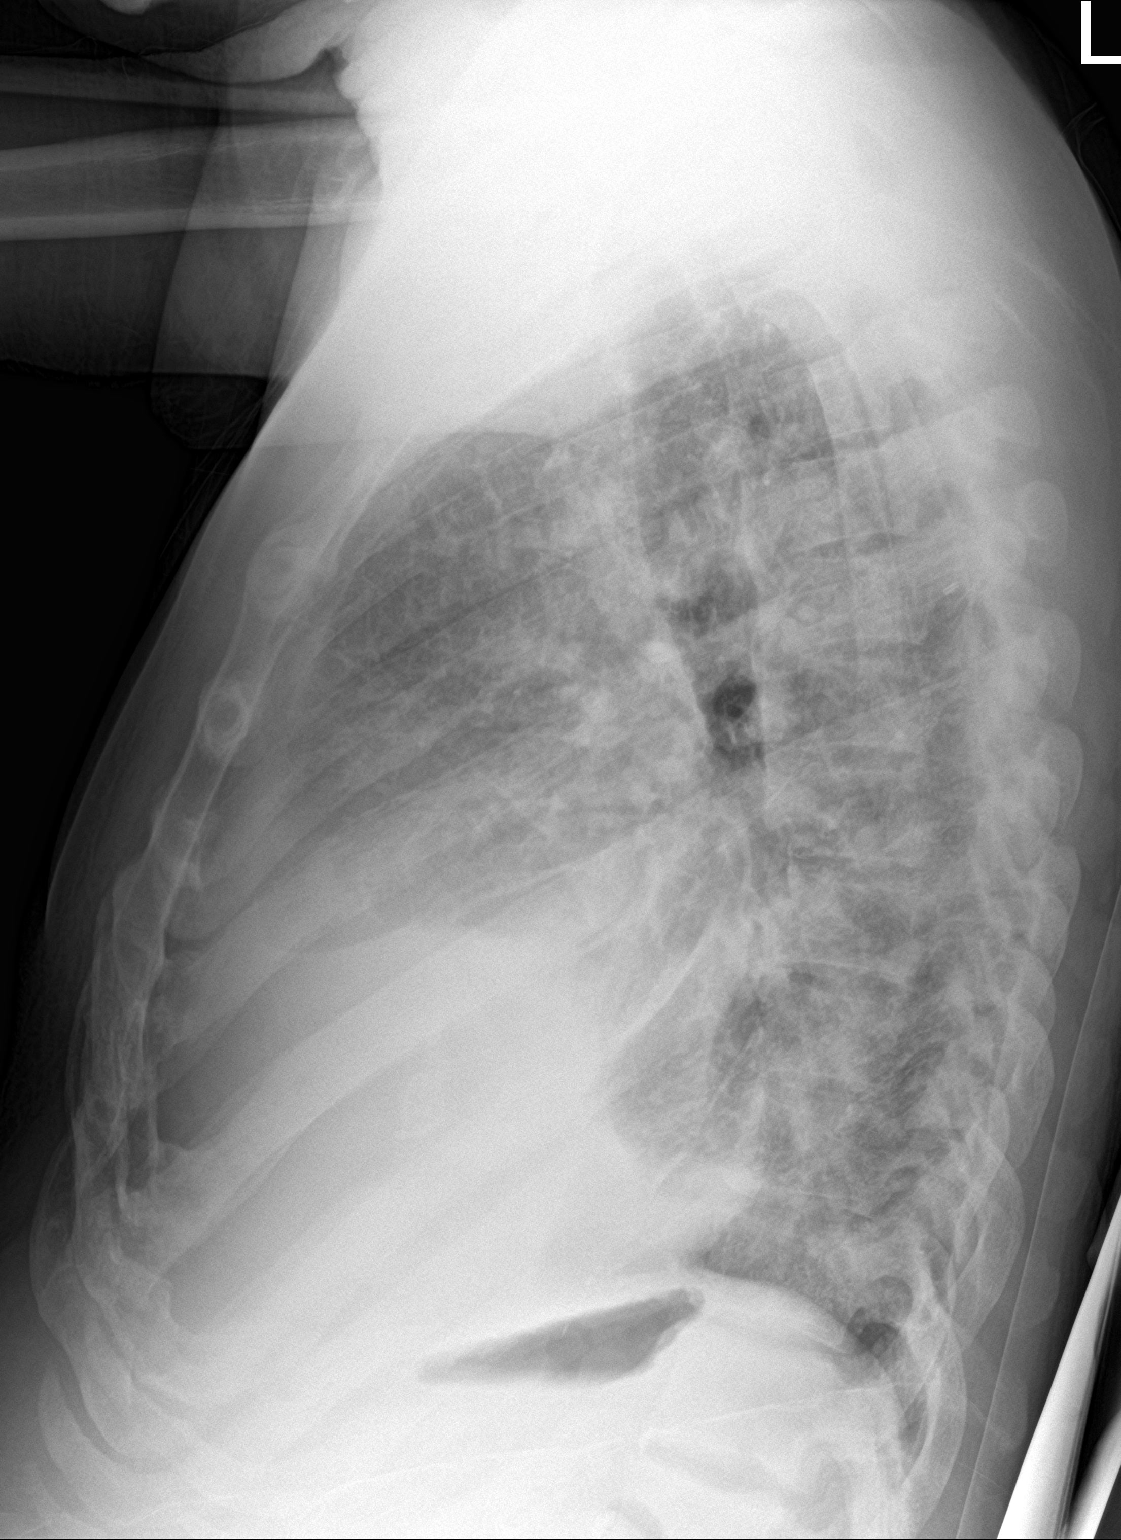

[chest ap]
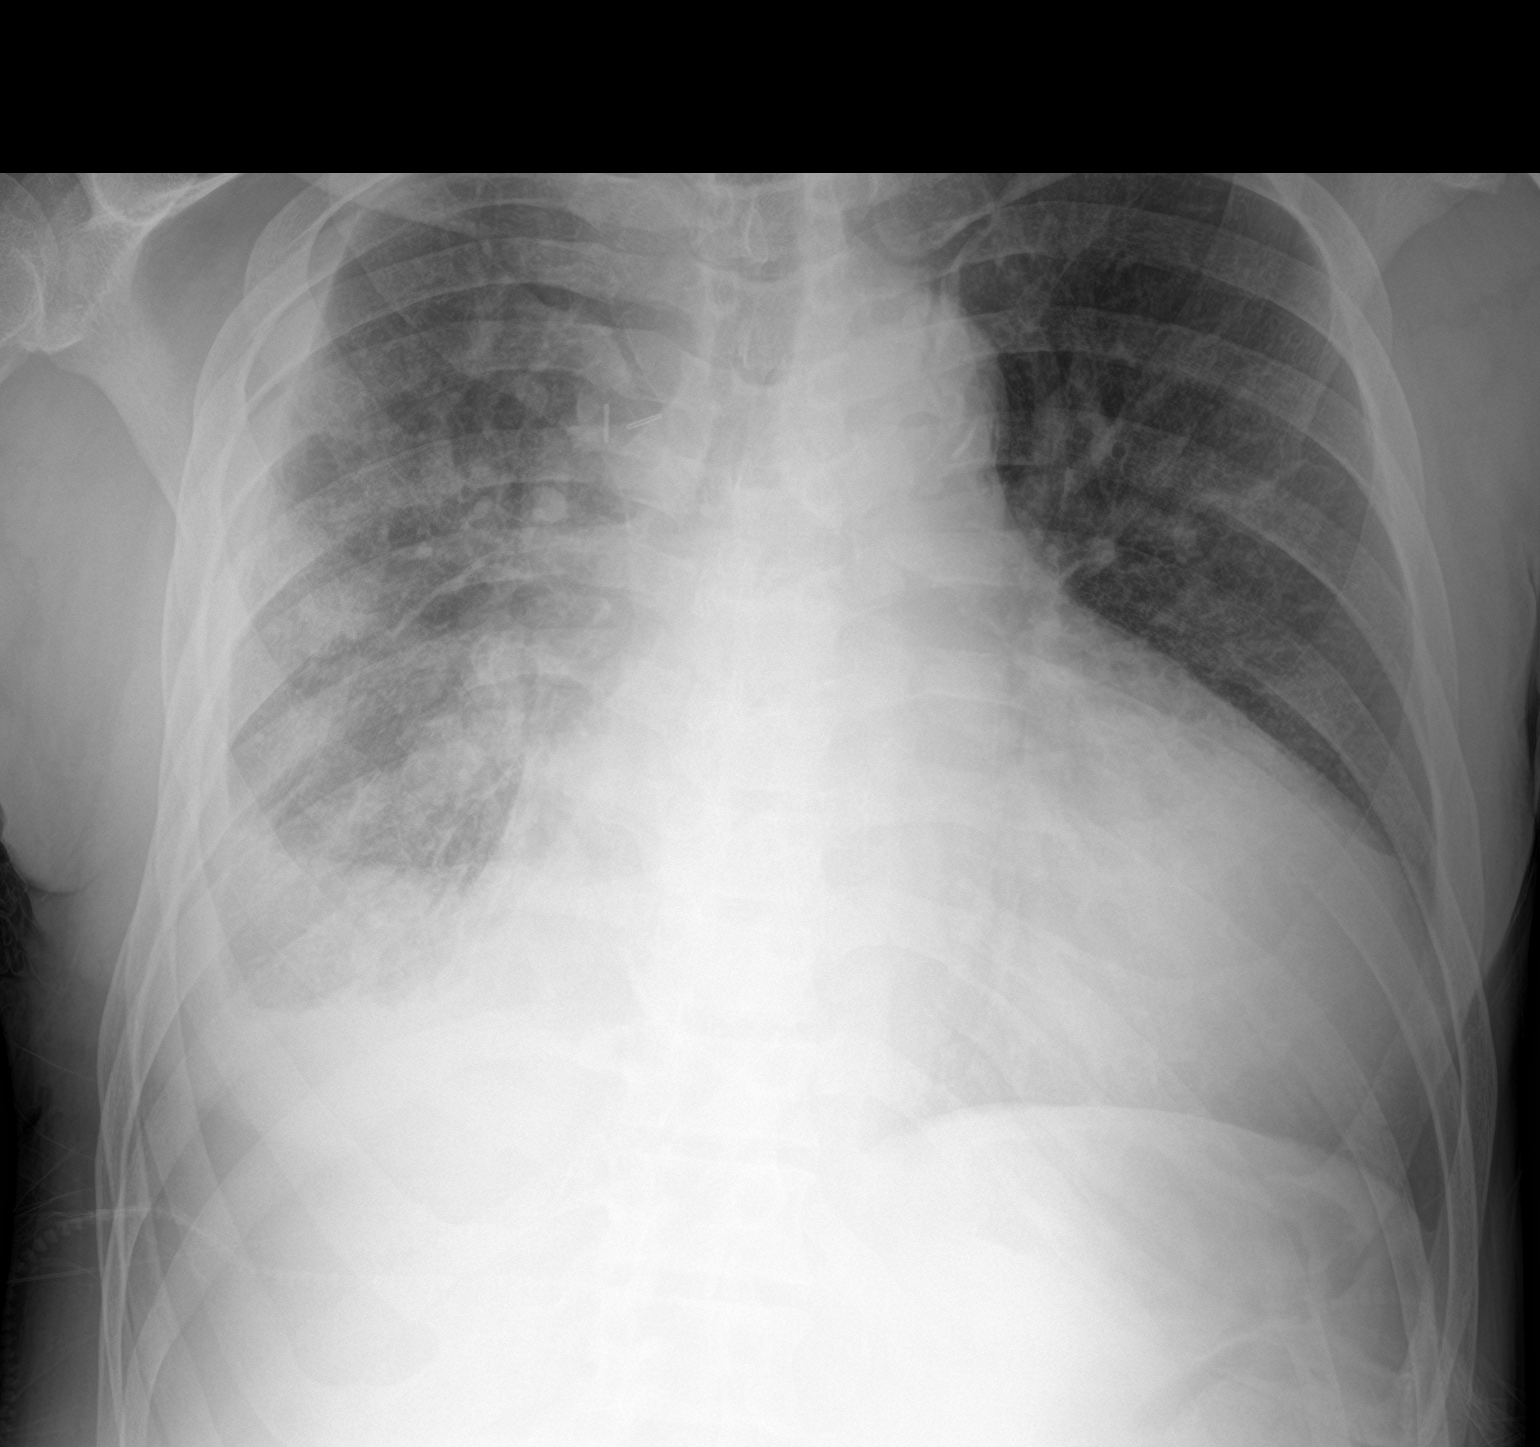

[2 of 2 positions shown; findings below may reference images not displayed]

FINDINGS: Stable cardiomegaly. Unchanged mediastinal contours. Small right and
possibly trace left pleural effusion, similar to prior. Right
pleural effusion is partially loculated and tracking laterally.
Vascular congestion. Patchy opacities in the right hemithorax,
similar to yesterday. No pneumothorax.
IMPRESSION: 1. Unchanged right pleural effusion which is likely partially
loculated tracking laterally. Under and patchy opacities in the
right lung may represent asymmetric pulmonary edema or pneumonia.
2. Stable cardiomegaly and vascular congestion.

## 2022-01-20 IMAGING — CR DG CHEST 2V
2 series · 2 of 2 positions shown · non-contrast
Comparison: 08/16/2019

CLINICAL DATA: Chest pain, history of DVT

EXAM:
CHEST - 2 VIEW

[chest pa]
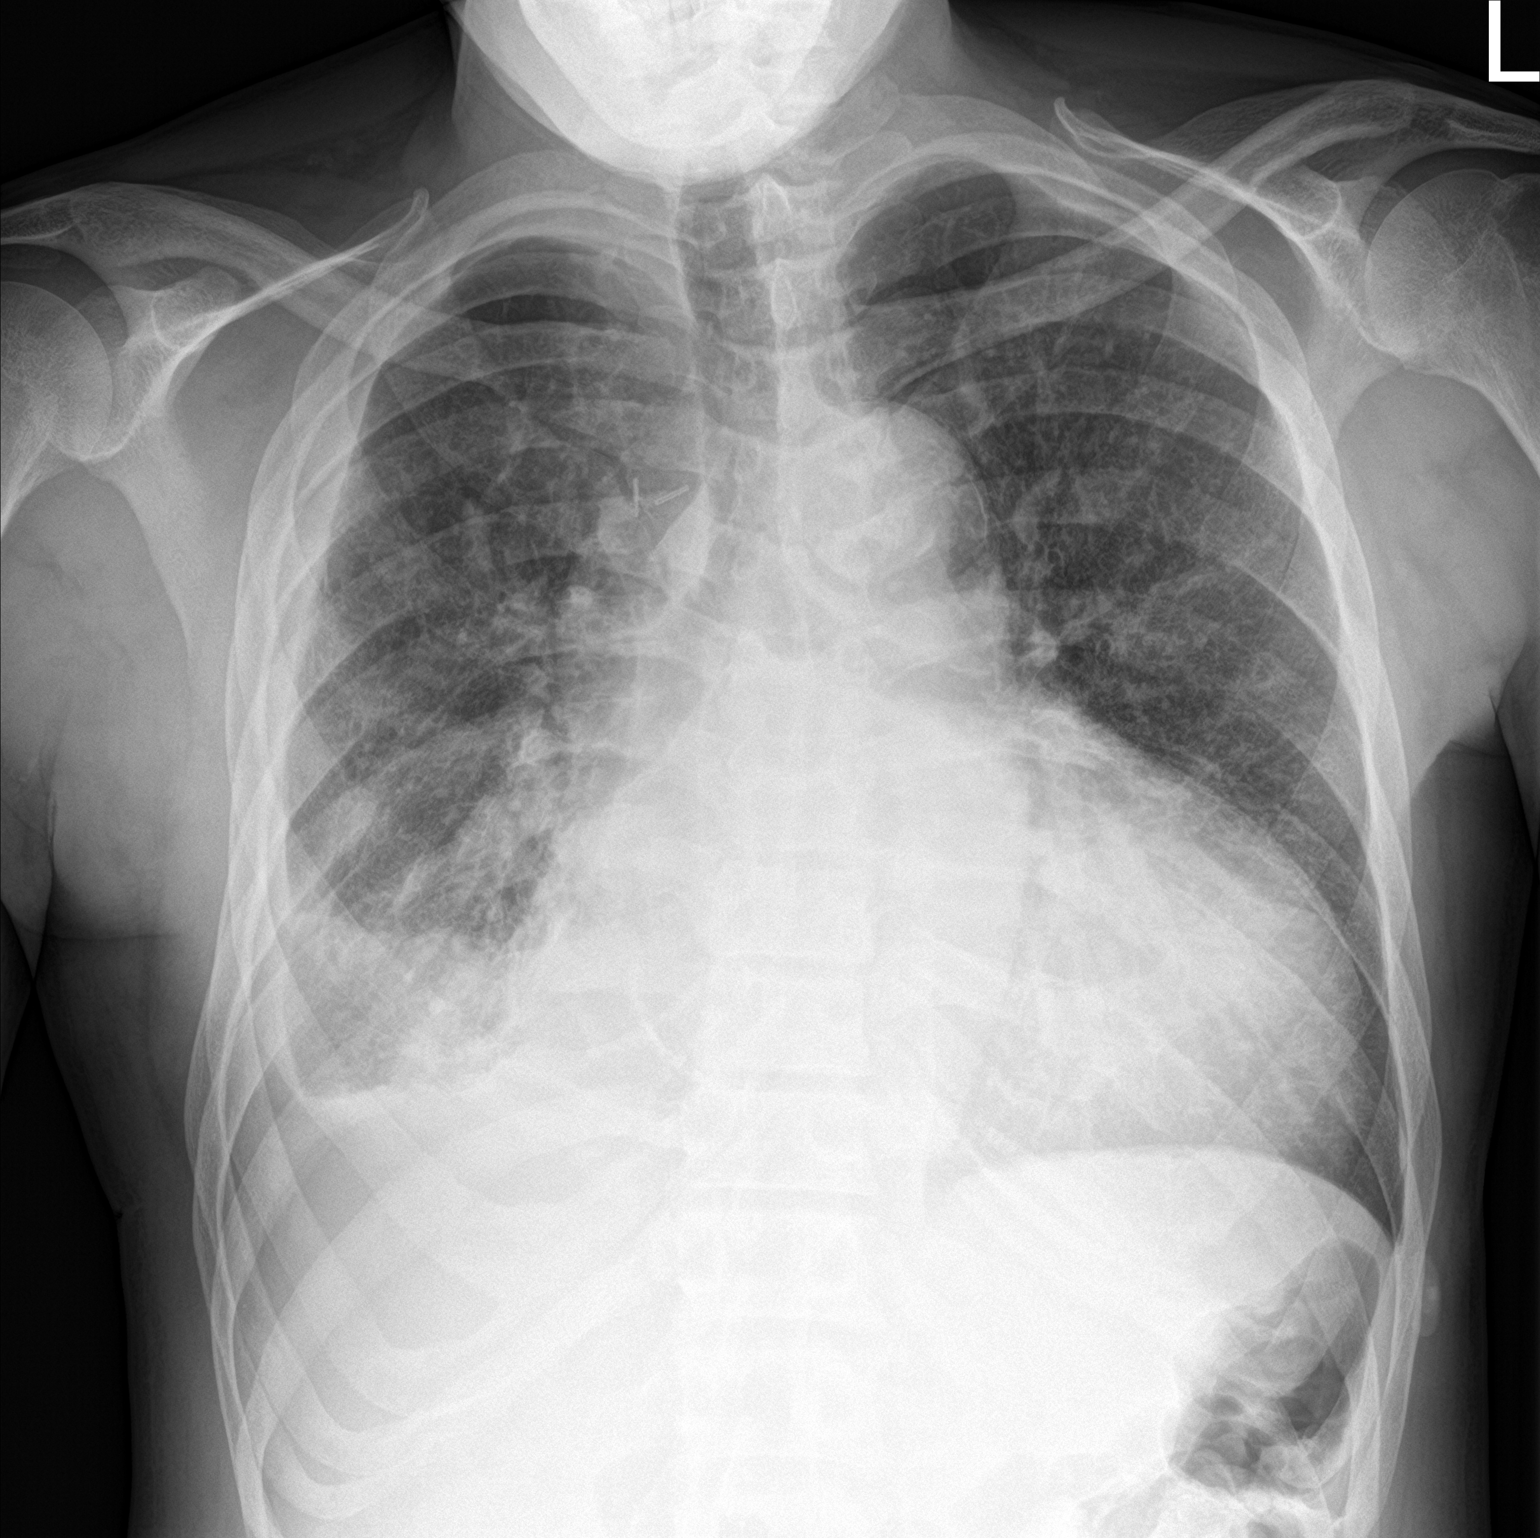

[chest lat]
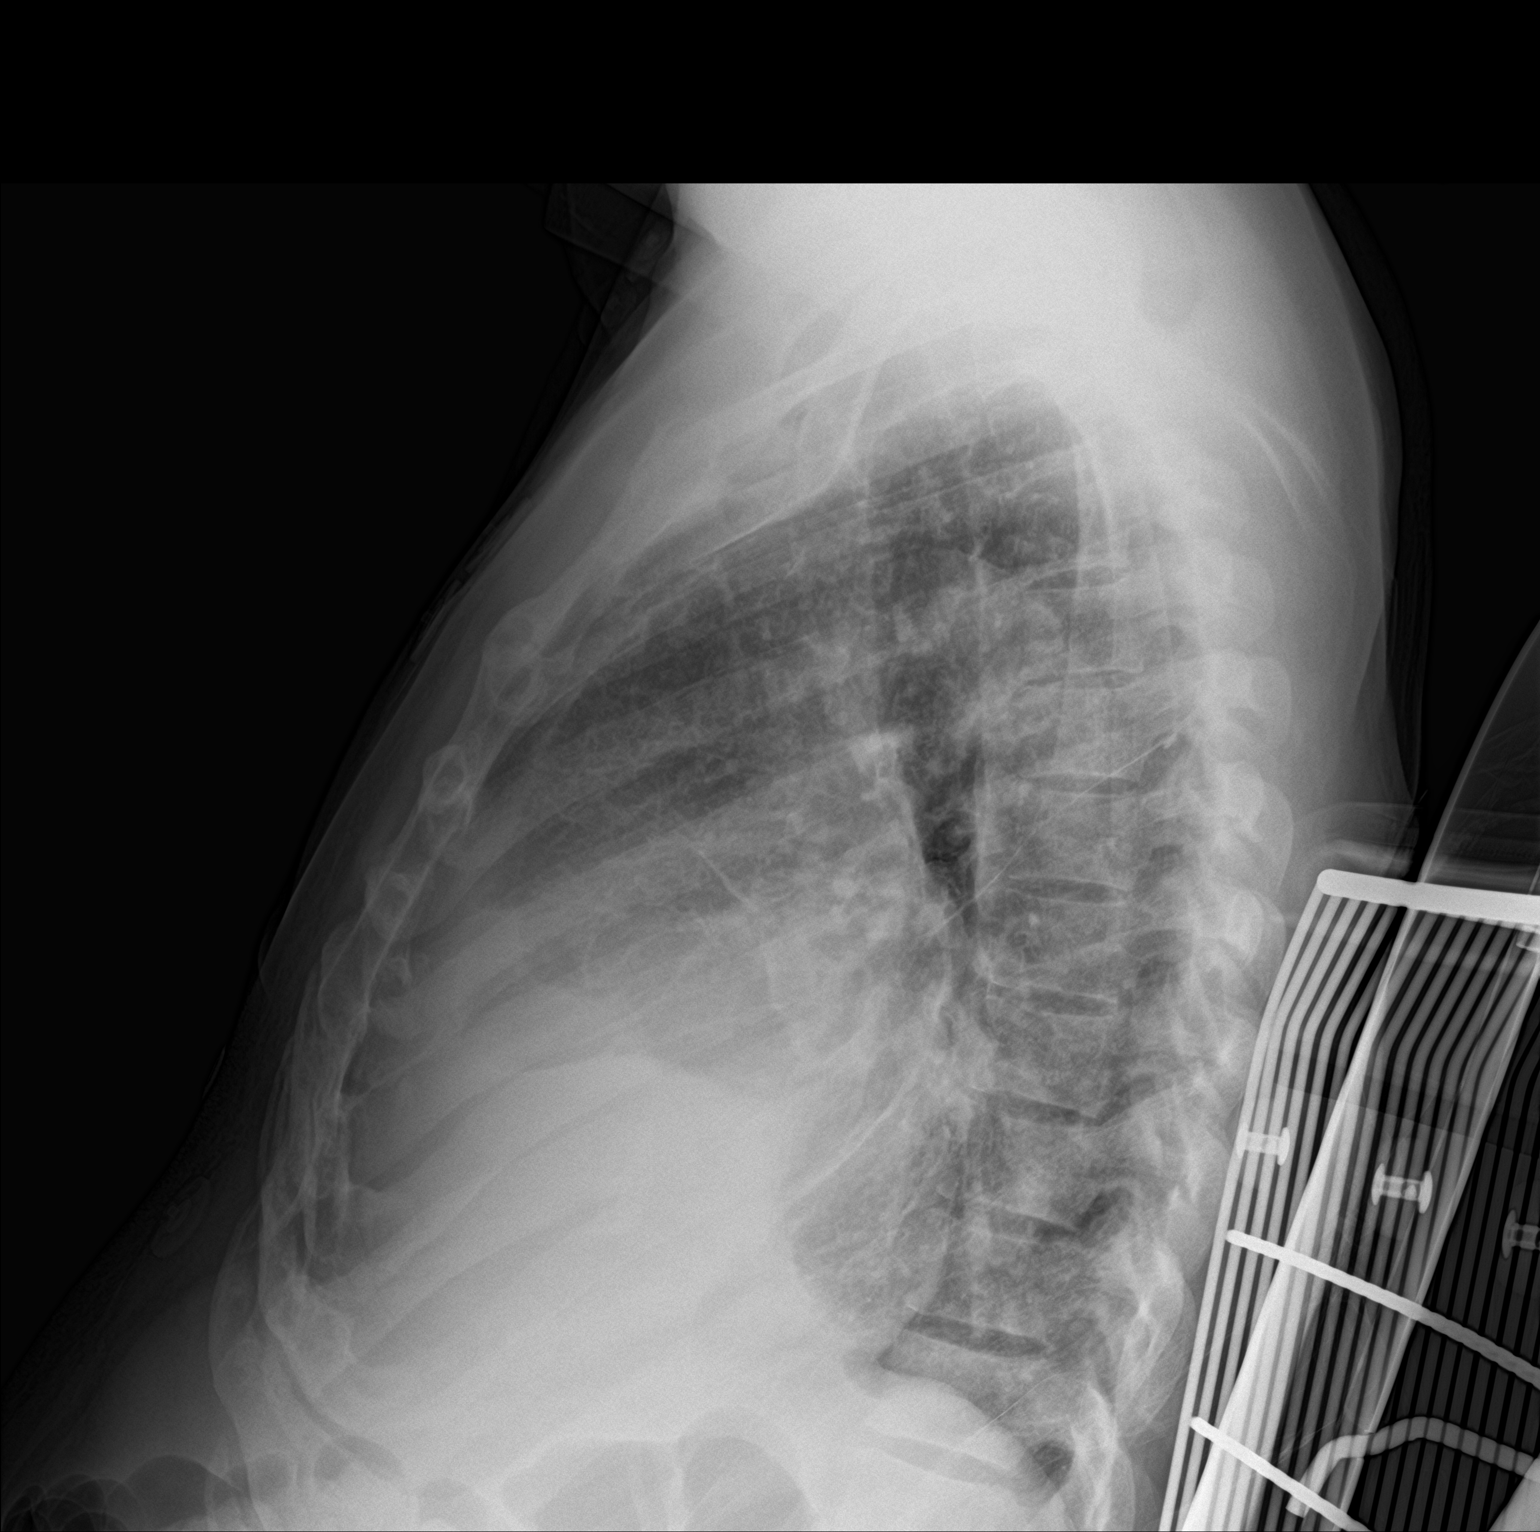

[2 of 2 positions shown; findings below may reference images not displayed]

FINDINGS: Frontal and lateral views of the chest demonstrate stable
enlargement the cardiac silhouette. Chronic right basilar
consolidation and right pleural effusion unchanged. There is chronic
diffuse interstitial prominence unchanged. No pneumothorax. No acute
bony abnormalities.
IMPRESSION: 1. Stable interstitial edema.
2. Persistent right basilar consolidation and loculated right
pleural effusion.

## 2022-02-09 IMAGING — DX DG HIP (WITH OR WITHOUT PELVIS) 2-3V*L*
3 series · 3 of 3 positions shown · non-contrast
Comparison: CT of the abdomen pelvis dated 07/08/2019.

CLINICAL DATA: 53-year-old male with left hip pain.

EXAM:
DG HIP (WITH OR WITHOUT PELVIS) 2-3V LEFT

[t pelvis ap]
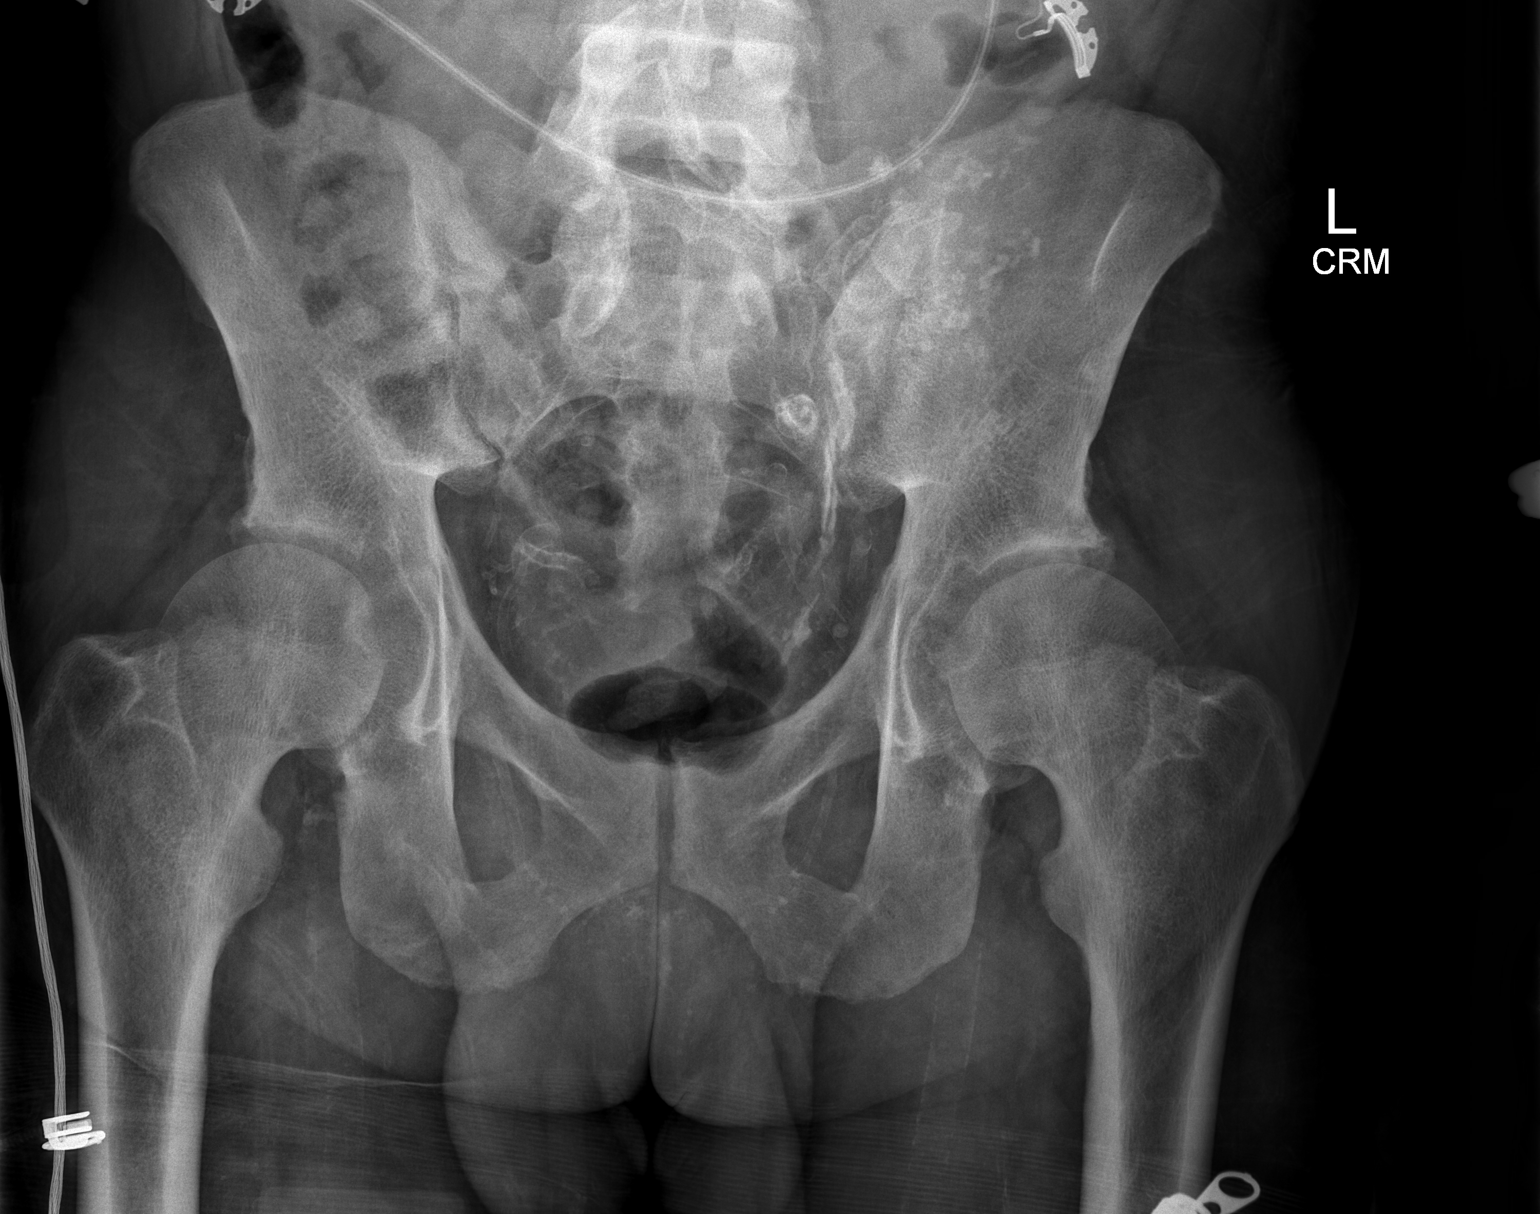

[t hip ap left]
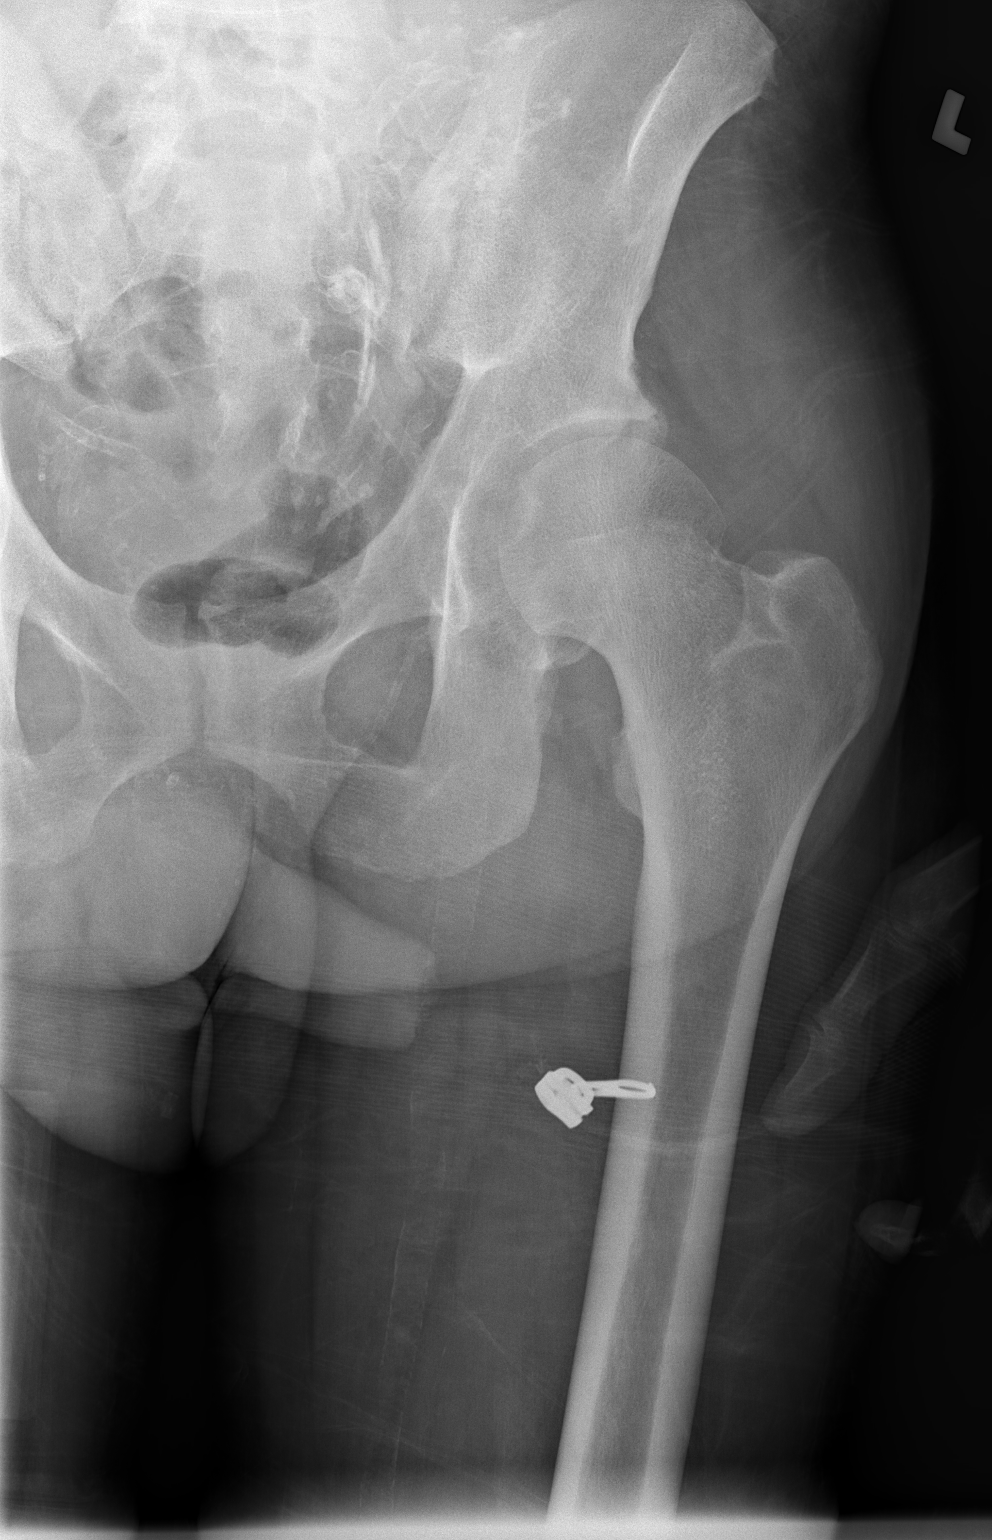

[t hip frog leg left]
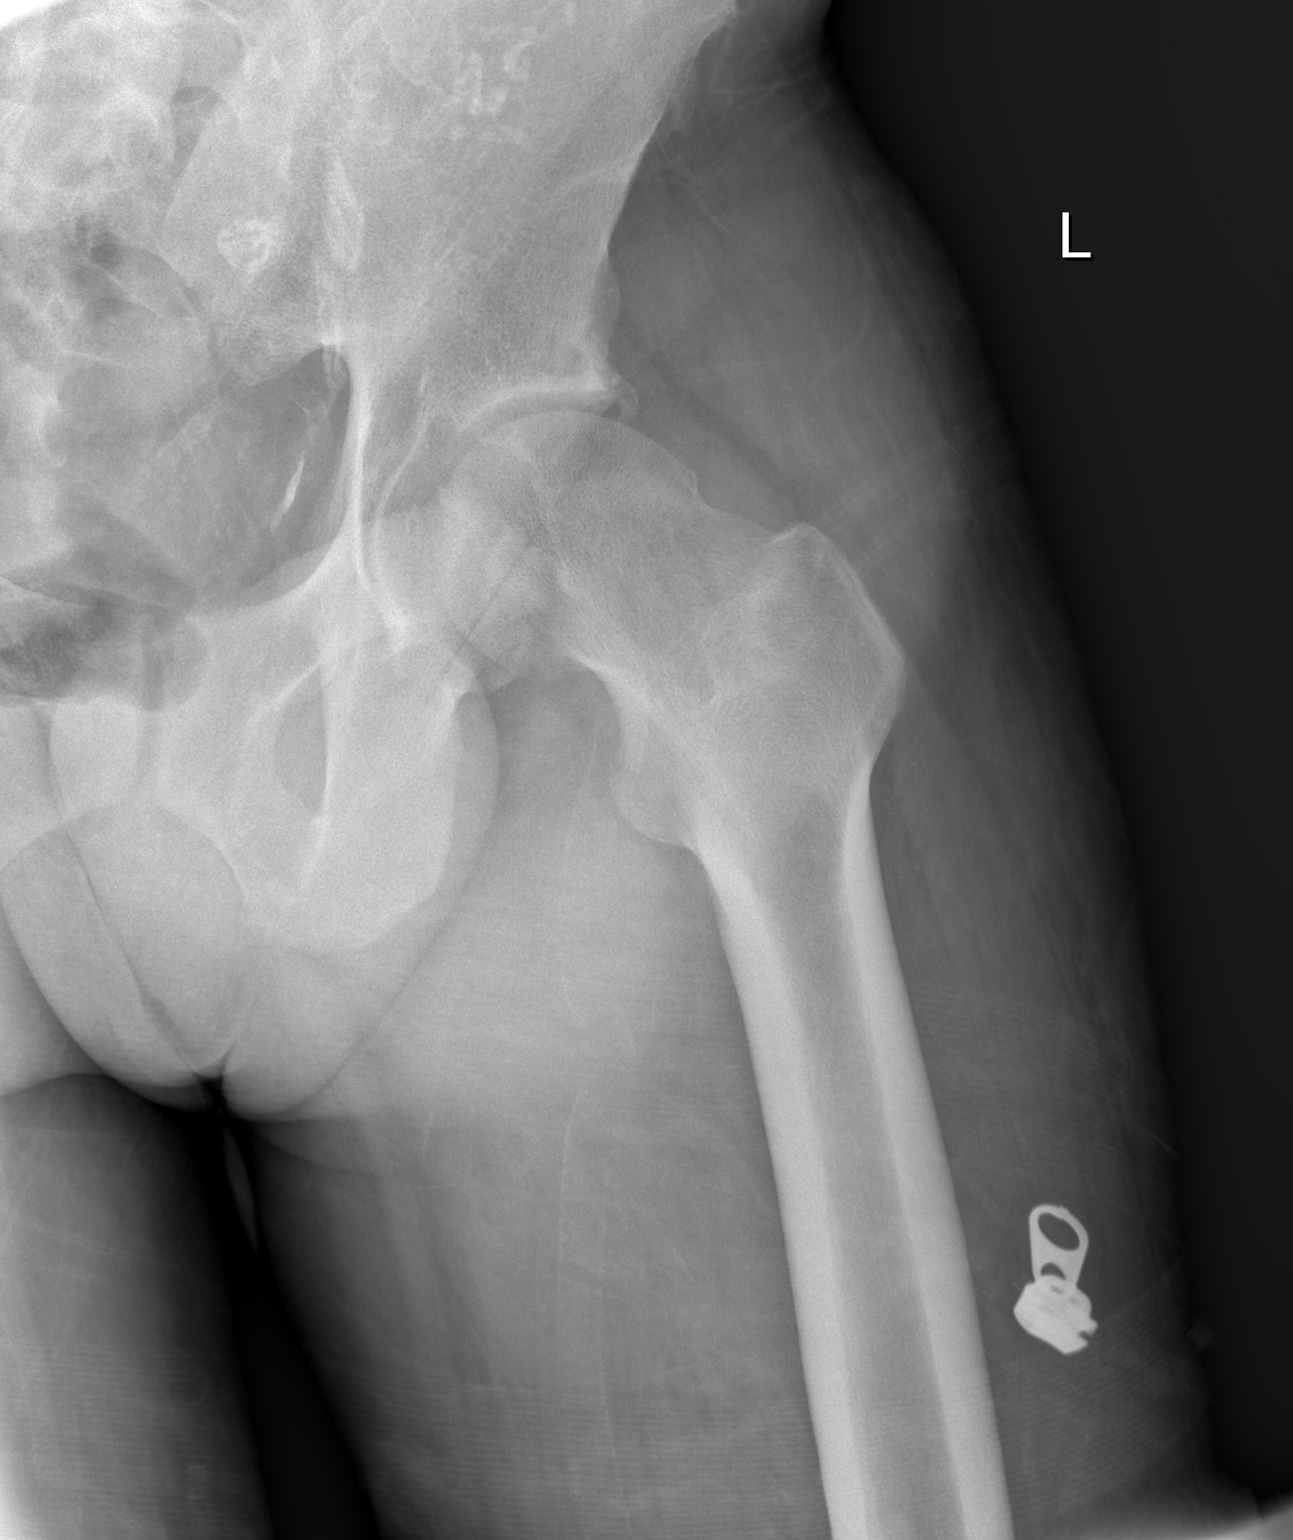

[3 of 3 positions shown; findings below may reference images not displayed]

FINDINGS: There is no acute fracture or dislocation. Mild arthritic changes of
the hips bilaterally. Calcification in the left hemipelvis
corresponds to the renal transplant. The soft tissues are
unremarkable.
IMPRESSION: No acute fracture or dislocation.

## 2022-02-21 IMAGING — CR DG ABDOMEN 1V
1 series · 1 of 1 positions shown · non-contrast
Comparison: 07/08/2019 abdominal CT

CLINICAL DATA: Generalized abdominal pain

EXAM:
ABDOMEN - 1 VIEW

[abdomen kub]
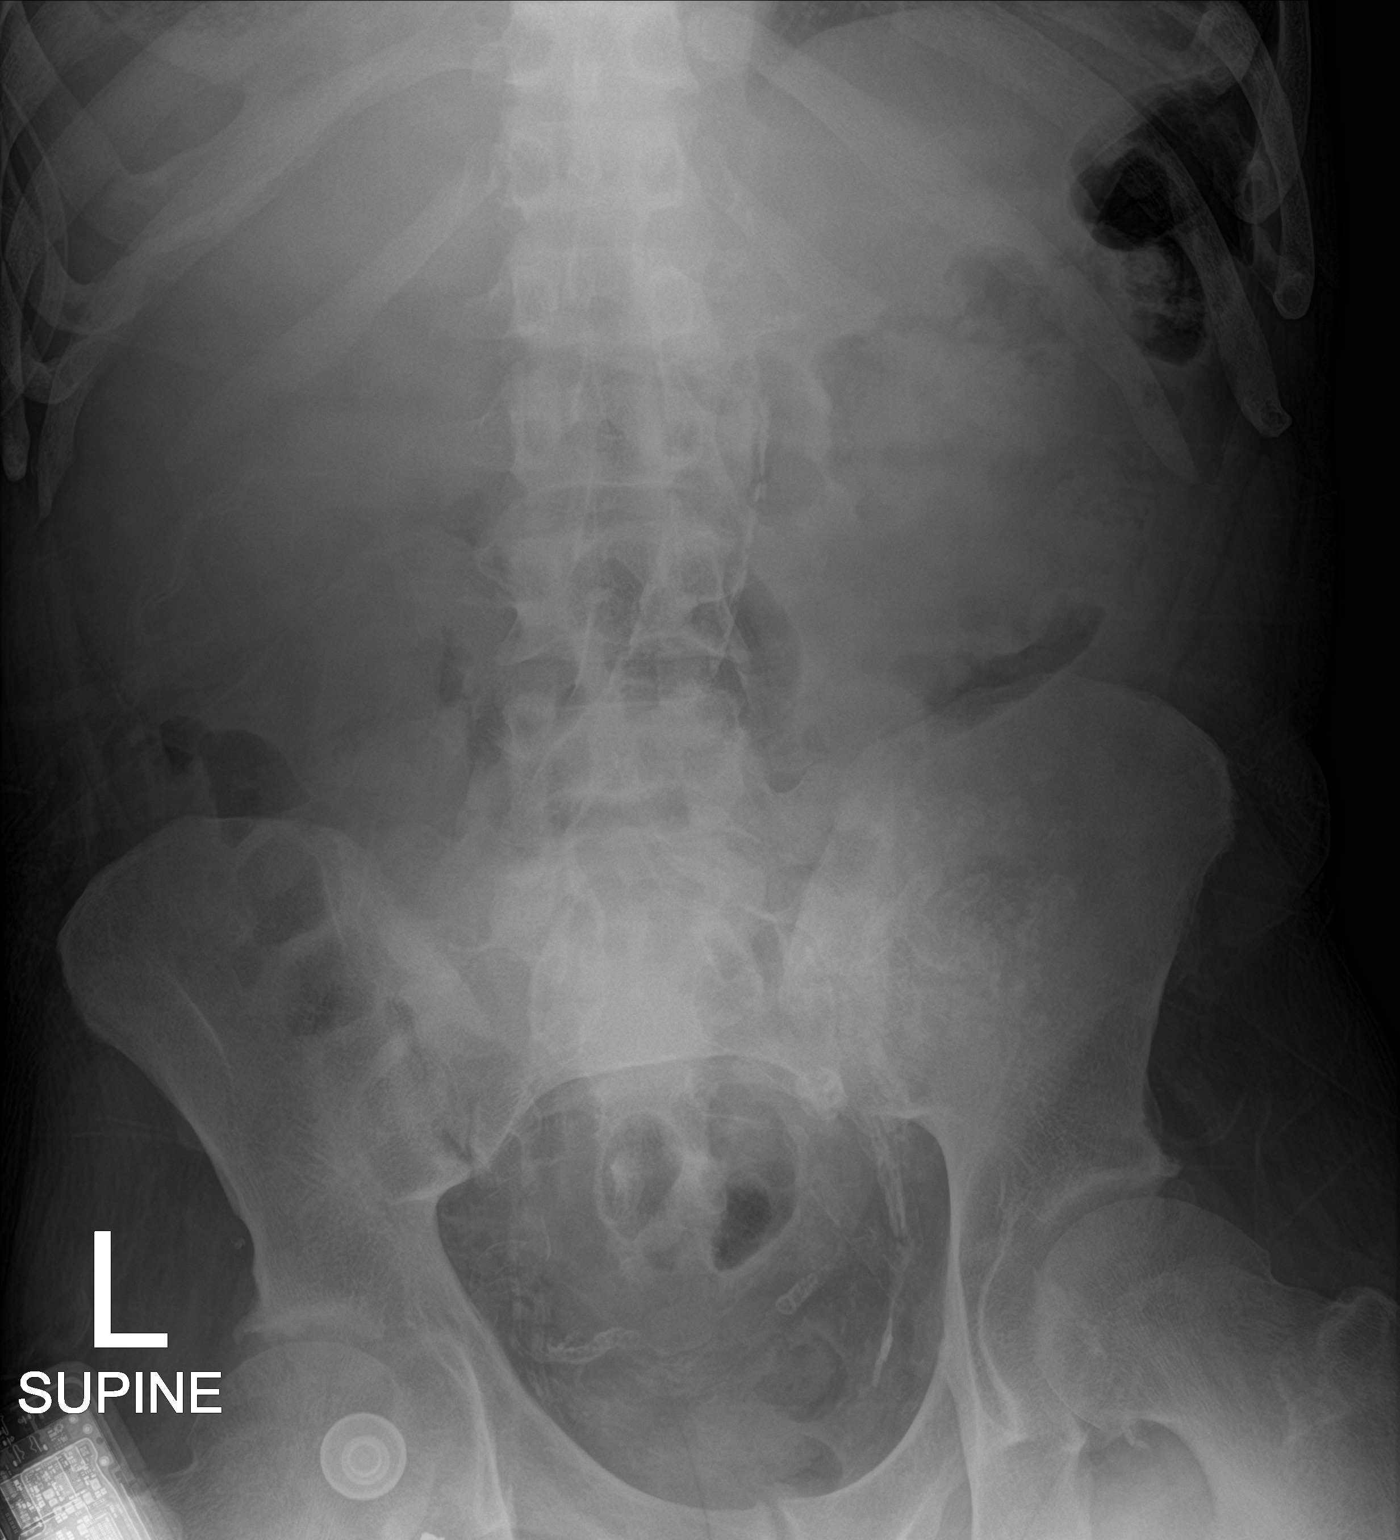

[1 of 1 positions shown; findings below may reference images not displayed]

FINDINGS: Diffuse arterial calcification. Dystrophic calcification over the
left iliac fossa from failed renal transplant. Normal bowel gas
pattern. No generalized stool retention. Sclerotic bones attributed
to renal osteodystrophy.
IMPRESSION: Nonobstructive bowel gas pattern.

## 2022-02-21 IMAGING — CR DG CHEST 2V
2 series · 2 of 2 positions shown · non-contrast
Comparison: 09/01/2019

CLINICAL DATA: Shortness of breath abdominal pain, dialysis patient

EXAM:
CHEST - 2 VIEW

[chest lat]
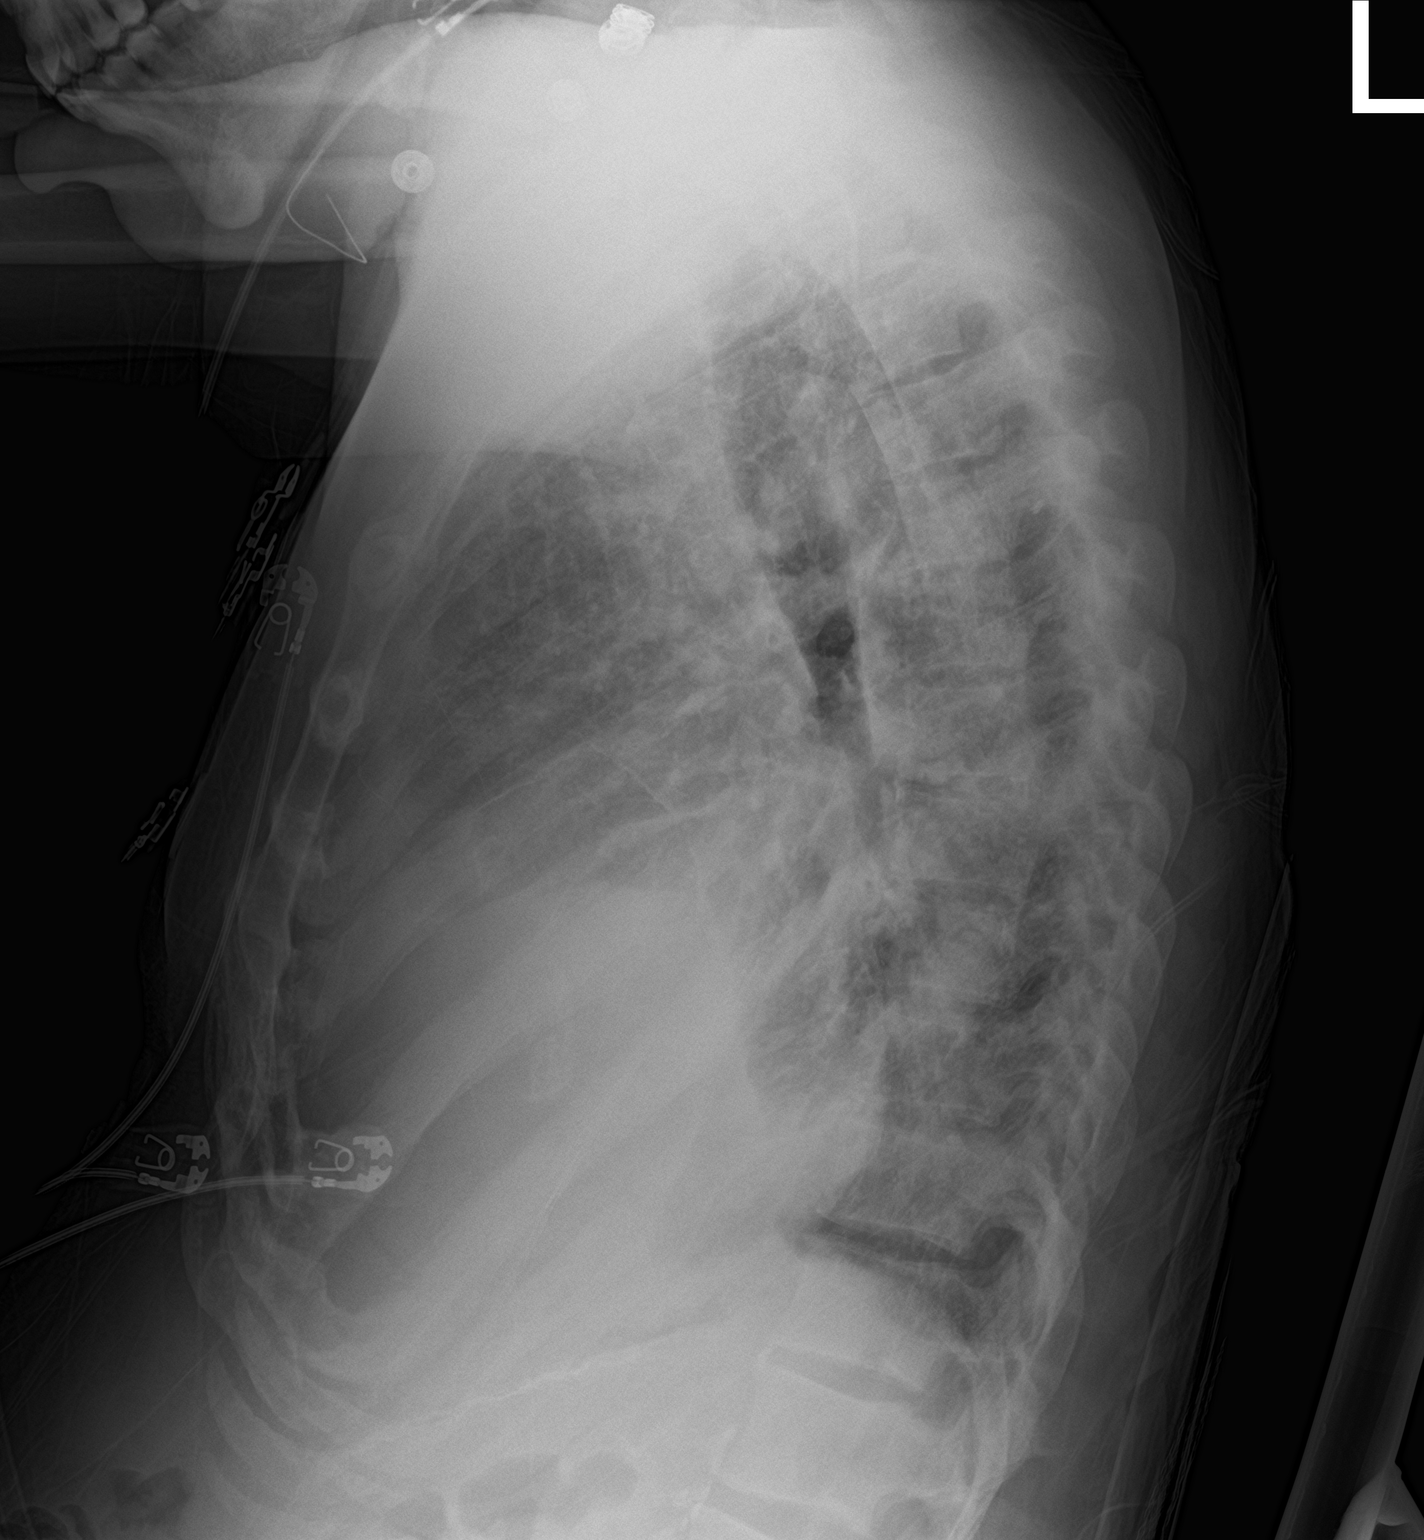

[chest ap]
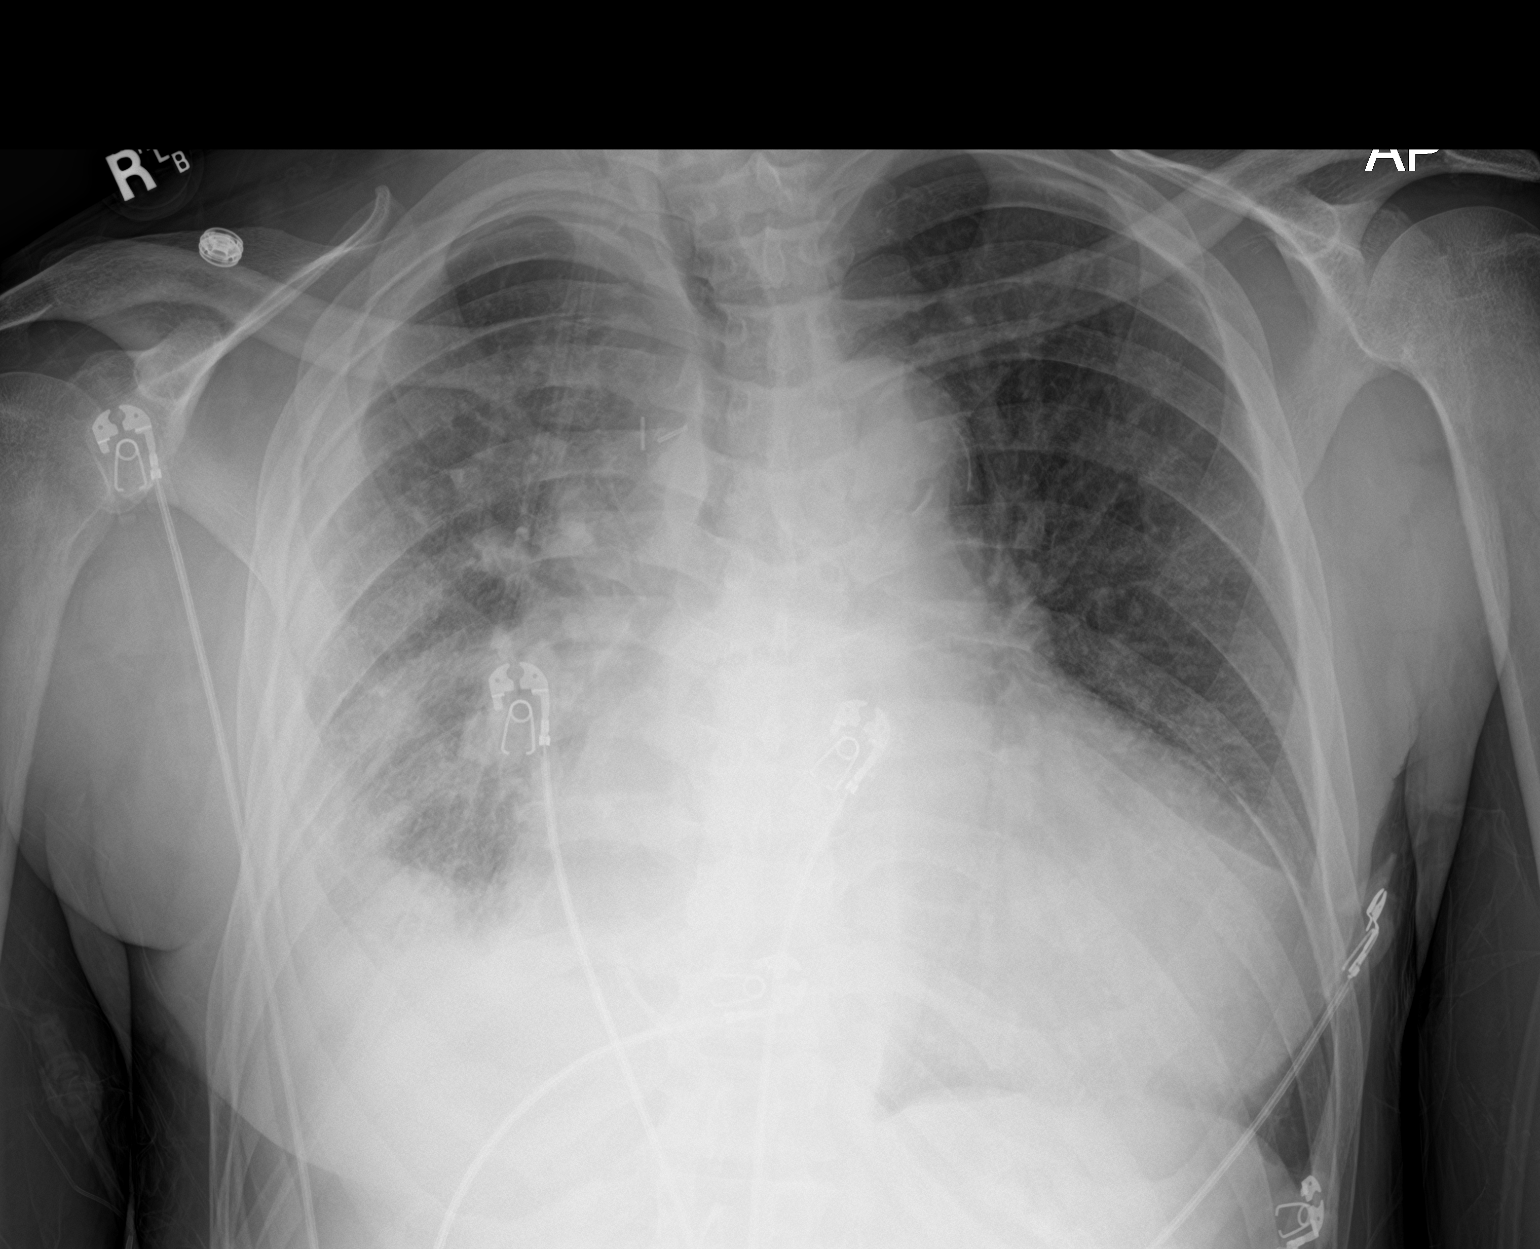

[2 of 2 positions shown; findings below may reference images not displayed]

FINDINGS: Stable cardiomegaly with vascular congestion. Similar chronic right
basilar consolidation and effusion better demonstrated by CT
comparison. No left effusion. Trachea midline. No gross interval
change.
IMPRESSION: Stable cardiomegaly with vascular congestion

Similar chronic right base consolidation and loculated pleural
effusion.

Aortic Atherosclerosis ([id number]-K49.9).

## 2022-02-23 IMAGING — CT CT ABD-PELV W/O CM
2 of 4 series · 16 of 46 positions shown, 18 images · non-contrast
Comparison: CT 07/08/2019, 09/03/2019

CLINICAL DATA: Abdominal pain nonlocalized

EXAM:
CT ABDOMEN AND PELVIS WITHOUT CONTRAST
TECHNIQUE: Multidetector CT imaging of the abdomen and pelvis was performed
following the standard protocol without IV contrast.

[Series 3: a/p w/o 5mm · axial · non-contrast · 0.73mm/px · z∈[+779,+1244]mm · 13 of 101 slices shown, 15 images]
[im 4/101  soft-tissue]
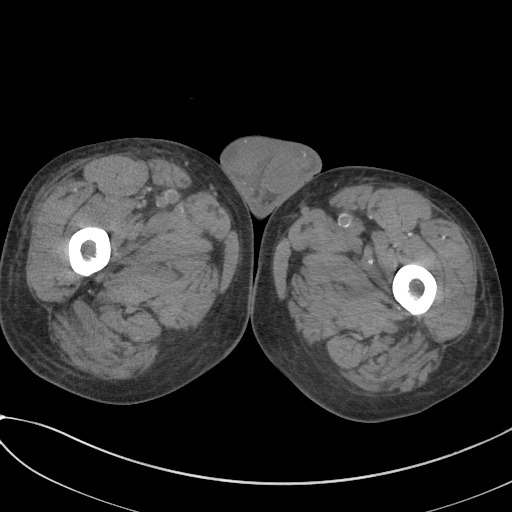
[im 4/101  bone]
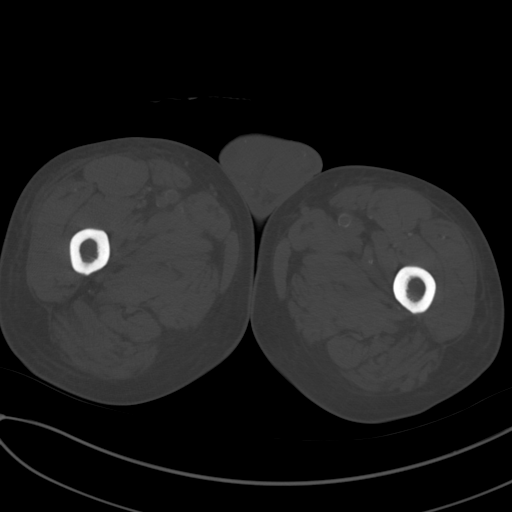
[im 12/101  soft-tissue]
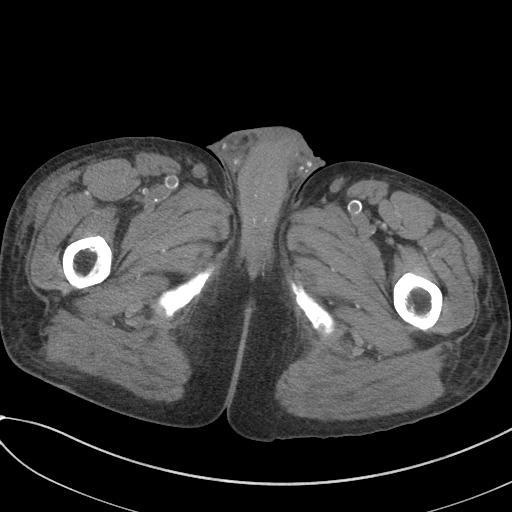
[im 20/101  soft-tissue]
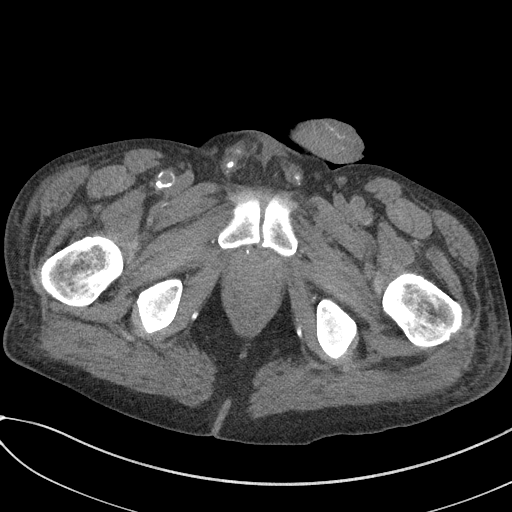
[im 27/101  soft-tissue]
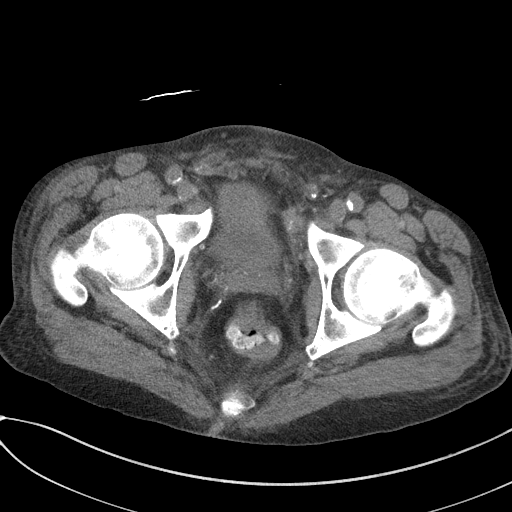
[im 35/101  soft-tissue]
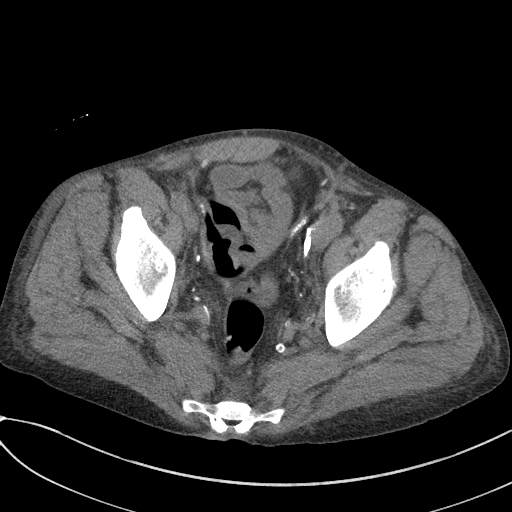
[im 43/101  soft-tissue]
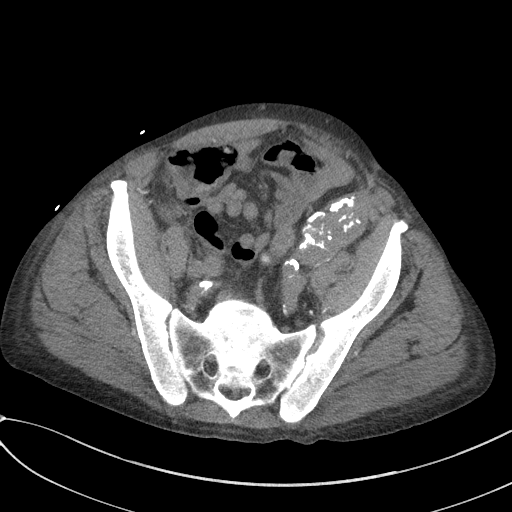
[im 51/101  soft-tissue]
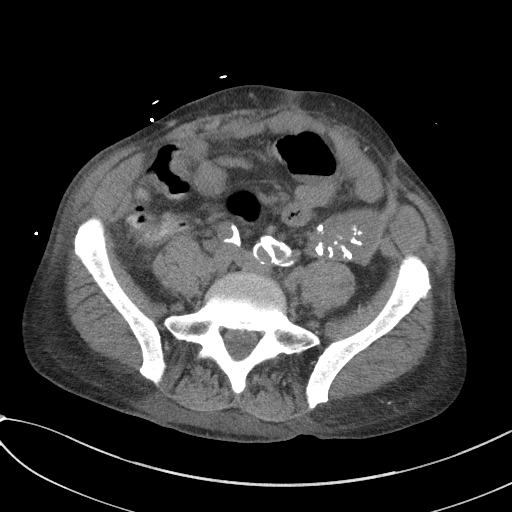
[im 58/101  soft-tissue]
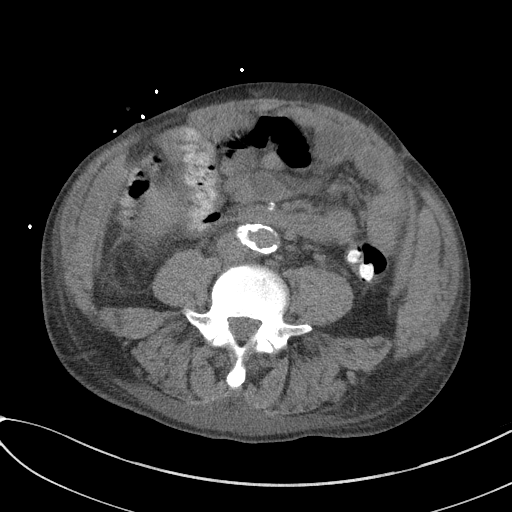
[im 66/101  soft-tissue]
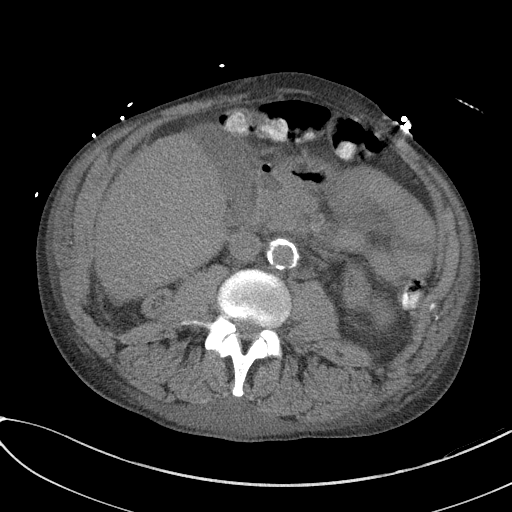
[im 66/101  bone]
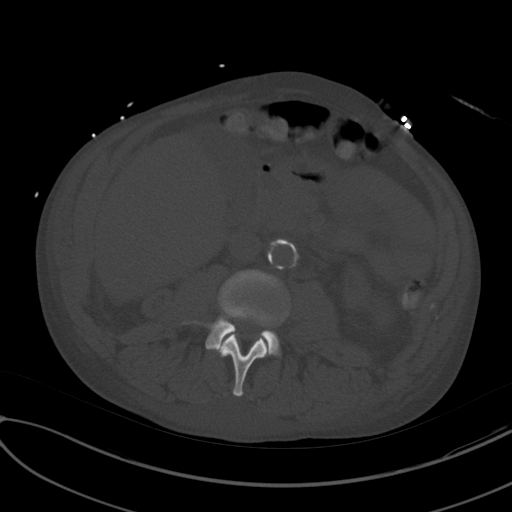
[im 74/101  soft-tissue]
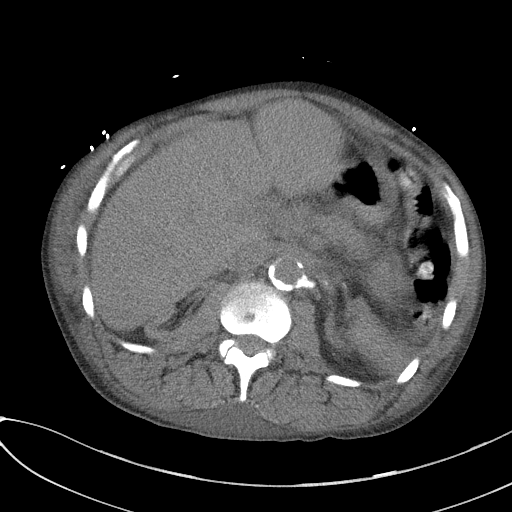
[im 81/101  soft-tissue]
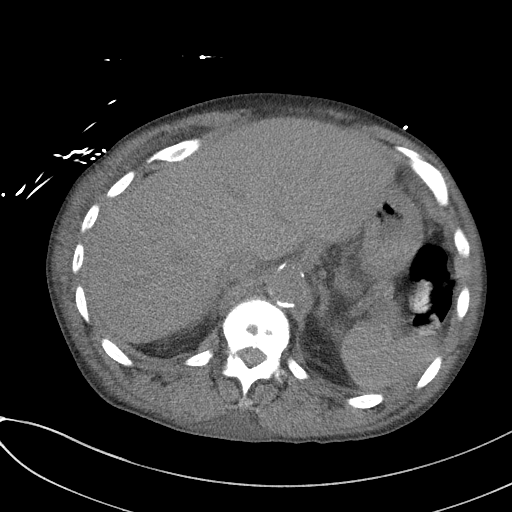
[im 89/101  soft-tissue]
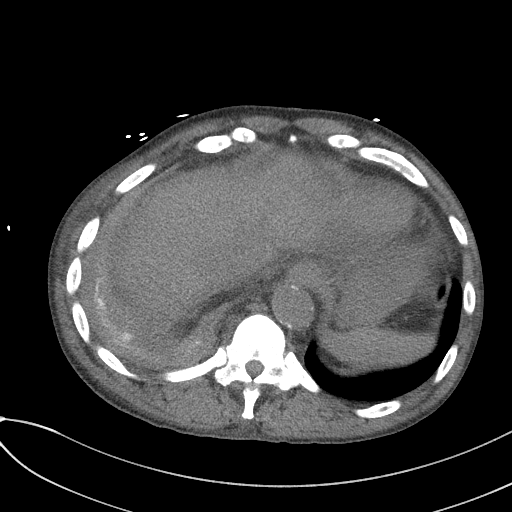
[im 97/101  soft-tissue]
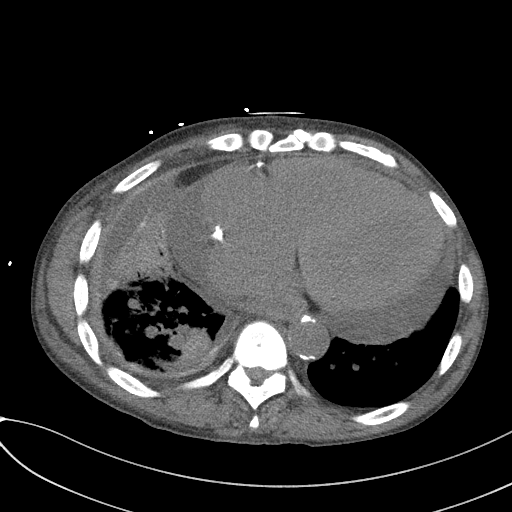

[Series 6: a/p w/o cor · coronal · non-contrast · 0.85mm/px · 3 of 148 slices shown]
[im 50/148  soft-tissue]
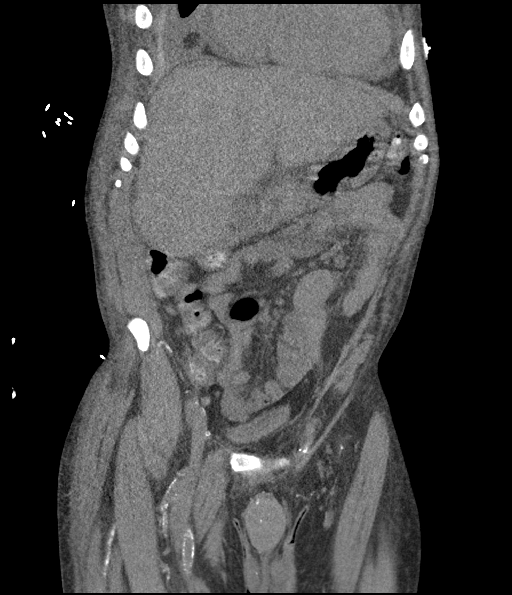
[im 66/148  soft-tissue]
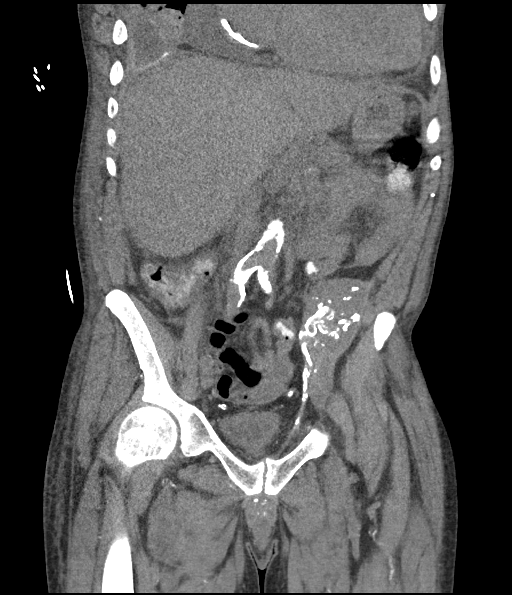
[im 82/148  soft-tissue]
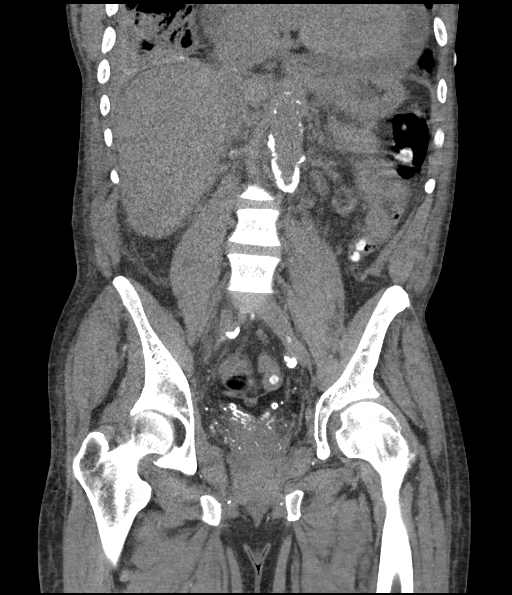

[16 of 46 positions shown; findings below may reference images not displayed]

FINDINGS: Lower chest: Pleural thickening at the LEFT lung base with rounded
atelectasis similar to comparison exam. There is calcification
within the pleural space and a loculated fluid collection measuring
6.7 by 3.0 cm (image [DATE]) also unchanged from prior.

Moderate size pericardial effusion unchanged.

Hepatobiliary: No focal hepatic lesion on noncontrast exam.

Pancreas: Pancreas is normal. No ductal dilatation. No pancreatic
inflammation.

Spleen: Normal spleen

Adrenals/urinary tract: Adrenal glands normal. Kidneys are atrophic.
Ureters and bladder normal

Stomach/Bowel: Stomach, small-bowel cecum normal. Colon rectosigmoid
colon normal. No bowel obstruction.

Vascular/Lymphatic: Abdominal aorta is normal caliber with
atherosclerotic calcification. There is no retroperitoneal or
periportal lymphadenopathy. No pelvic lymphadenopathy.

Oblong fluid collection in the RIGHT iliac fossa measuring 5.8 x
cm has multiple internal rounded calcifications. This finding is
unchanged from comparison exam consists with a failed calcified LEFT
renal transplant.

Reproductive: Prostate small

Other: No free fluid.

Musculoskeletal: No aggressive osseous lesion. Diffuse sclerosis the
bones consistent with renal osteodystrophy.
IMPRESSION: 1. Stable loculated fluid at the RIGHT lung base with RIGHT basilar
rounded atelectasis.
2. Stable moderate pericardial effusion.
3. No acute findings abdomen pelvis.
4. Failed renal transplant in the LEFT iliac fossa.
5. No bowel obstruction or inflammation.

## 2022-04-12 IMAGING — CT CT HEAD W/O CM
3 series · 16 of 47 positions shown, 19 images · non-contrast
Comparison: April 05, 2014 and June 24, 2017

CLINICAL DATA: Pain following trauma.  Lethargy.

EXAM:
CT HEAD WITHOUT CONTRAST
TECHNIQUE: Contiguous axial images were obtained from the base of the skull
through the vertex without intravenous contrast.

[Series 2: head wo · axial · 0.47mm/px · z∈[-160,-20]mm · 10 of 34 slices shown, 13 images]
[im 3/34  brain]
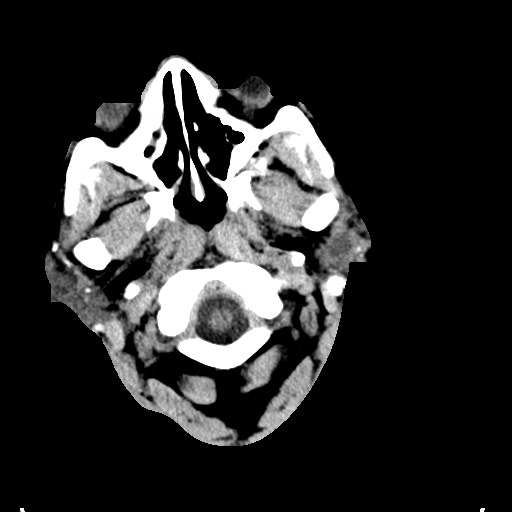
[im 3/34  bone]
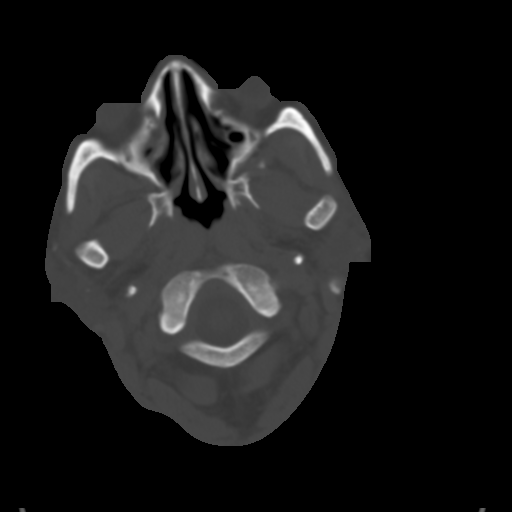
[im 6/34  brain]
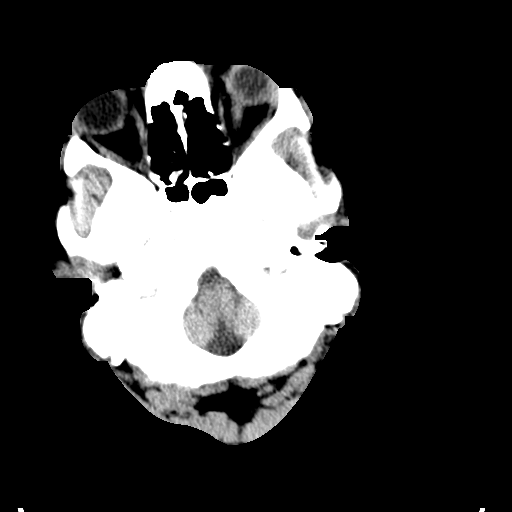
[im 10/34  brain]
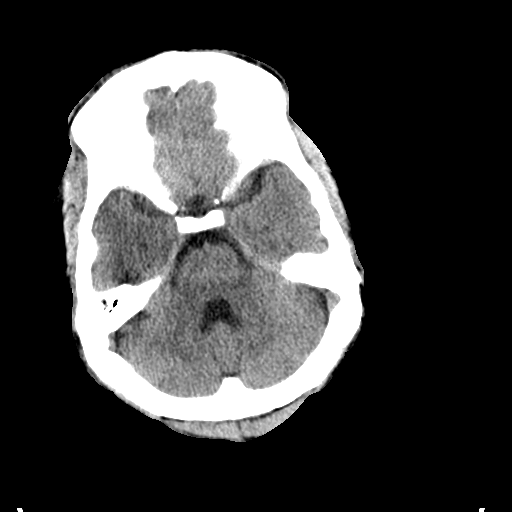
[im 12/34  brain]
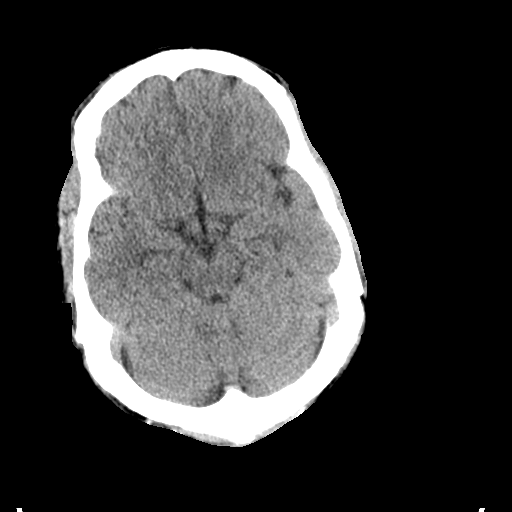
[im 15/34  brain]
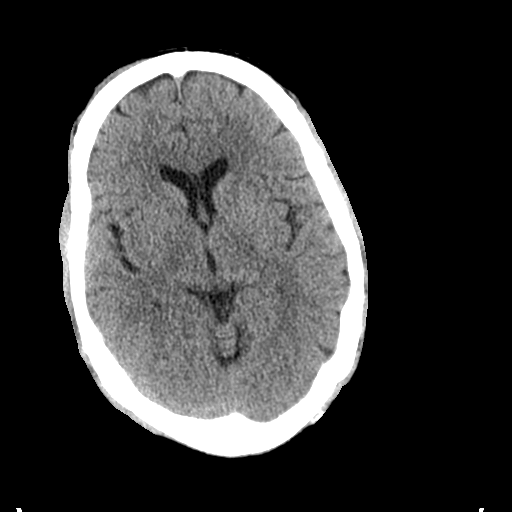
[im 15/34  bone]
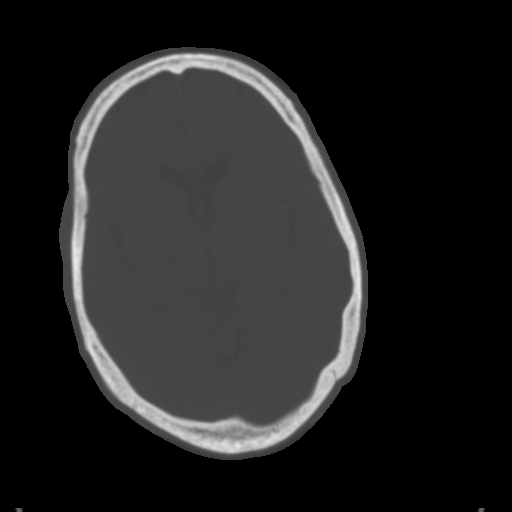
[im 19/34  brain]
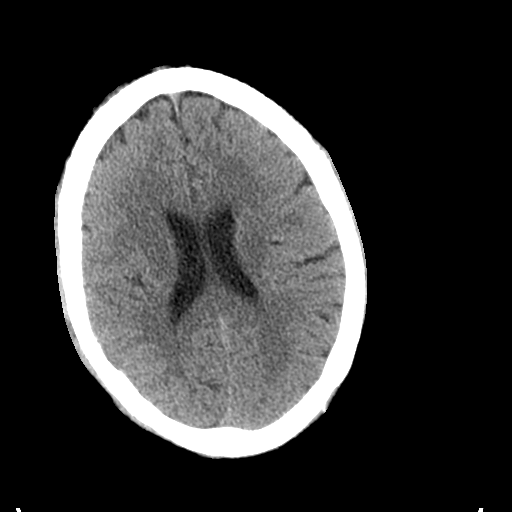
[im 22/34  brain]
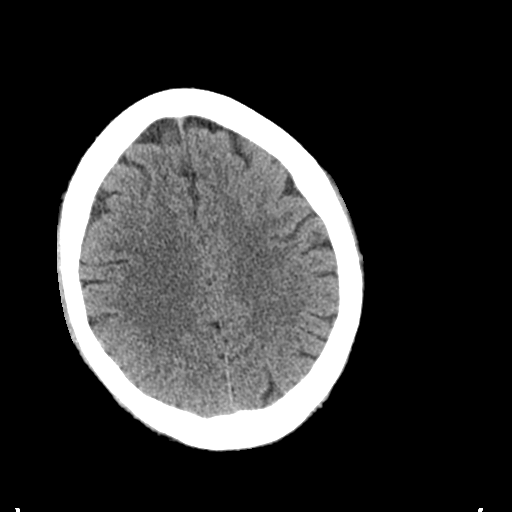
[im 26/34  brain]
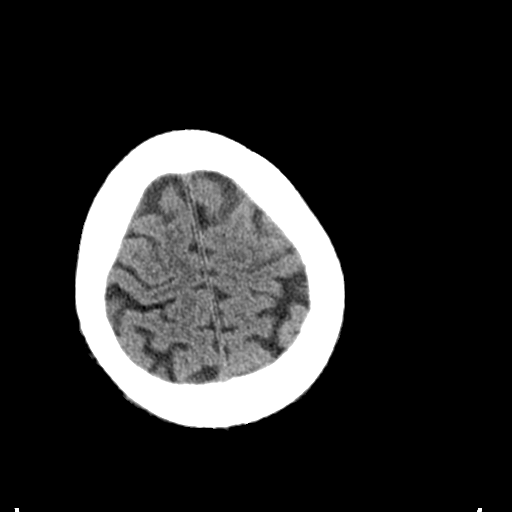
[im 28/34  brain]
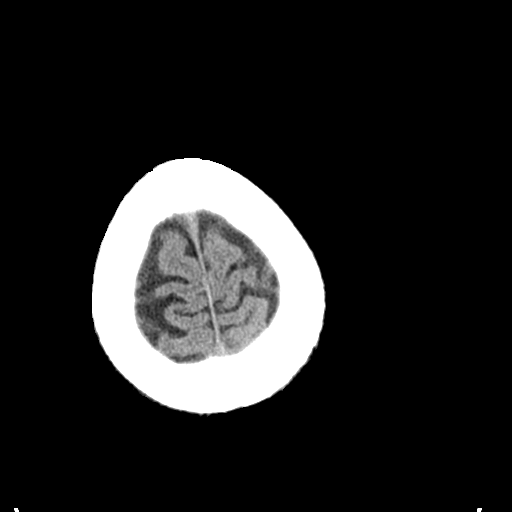
[im 28/34  bone]
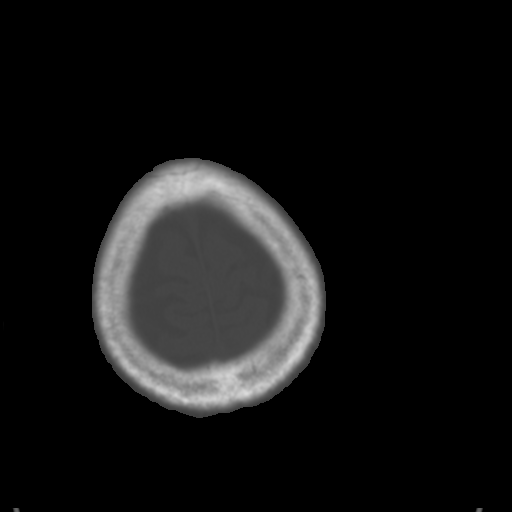
[im 31/34  brain]
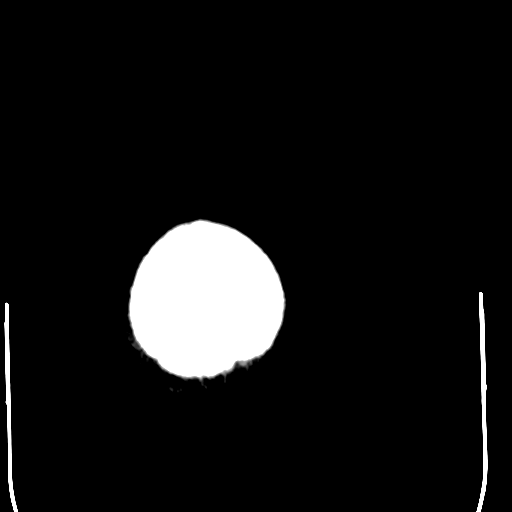

[Series 4: coronal soft tissue · coronal · 0.34mm/px · 3 of 71 slices shown]
[im 24/71  brain]
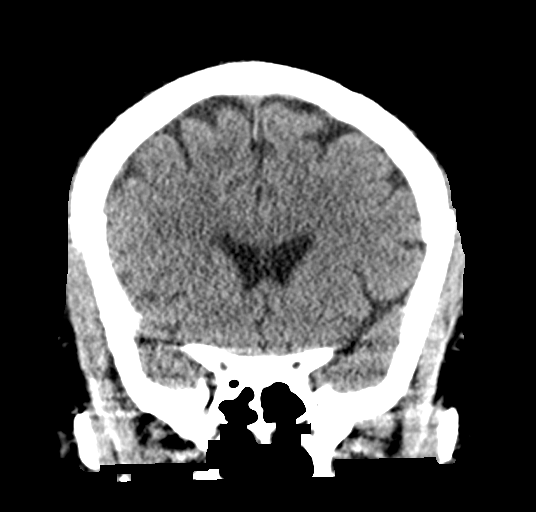
[im 32/71  brain]
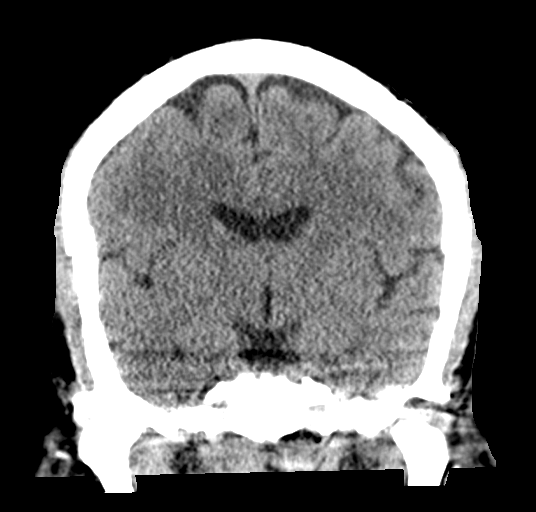
[im 39/71  brain]
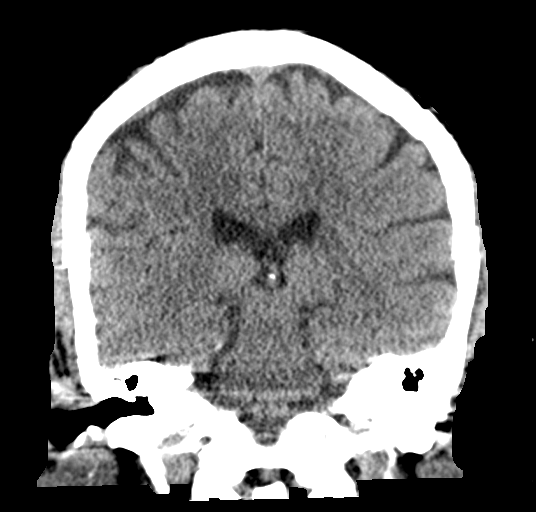

[Series 5: sagittal soft tissue · sagittal · 0.39mm/px · 3 of 61 slices shown]
[im 21/61  brain]
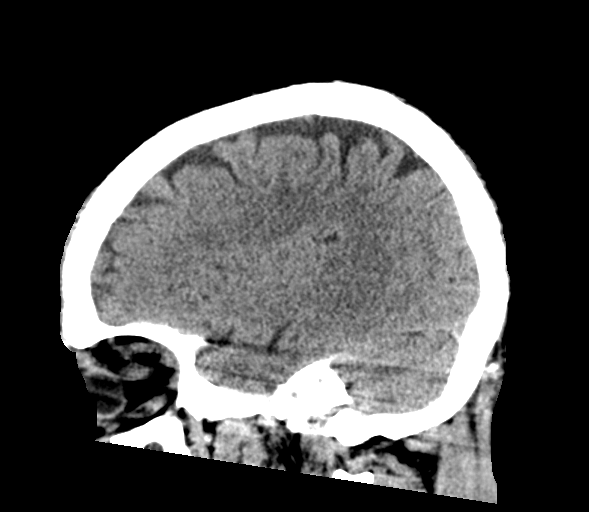
[im 31/61  brain]
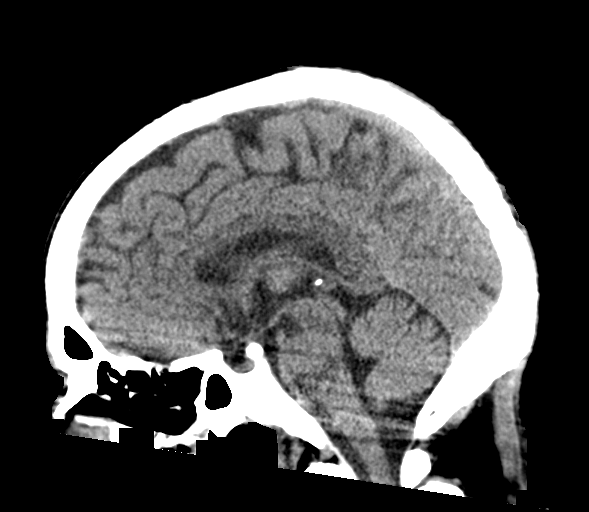
[im 41/61  brain]
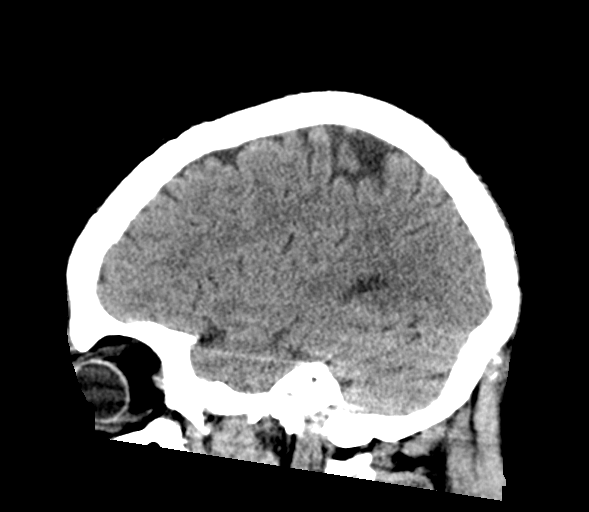

[16 of 47 positions shown; findings below may reference images not displayed]

FINDINGS: Brain: Ventricles and sulci are normal in size and configuration.
There is no intracranial mass, hemorrhage, extra-axial fluid
collection, or midline shift. Brain parenchyma appears unremarkable.
There is no demonstrable acute infarct.

Vascular: There is no hyperdense vessel. There is calcification in
each carotid siphon region.

Skull: The bony calvarium appears intact. There is a right frontal
scalp hematoma.

Sinuses/Orbits: There is mucosal thickening in the maxillary antra
bilaterally. There is mild mucosal thickening in several ethmoid air
cells. Orbits appear symmetric bilaterally.

Other: Mastoids are hypoplastic bilaterally.  Mastoids are clear.
IMPRESSION: Brain parenchyma appears unremarkable.  No mass or hemorrhage.

Right frontal scalp hematoma.  No fracture evident.

There are foci of arterial vascular calcification.

There are multiple foci of paranasal sinus disease.

## 2022-04-12 IMAGING — DX DG CHEST 1V PORT
1 series · 1 of 1 positions shown · non-contrast
Comparison: 10/03/2019; 09/01/2019; CT abdomen pelvis-10/05/2019

CLINICAL DATA: Lethargic.  Altered level of consciousness.

EXAM:
PORTABLE CHEST 1 VIEW

[chest ap]
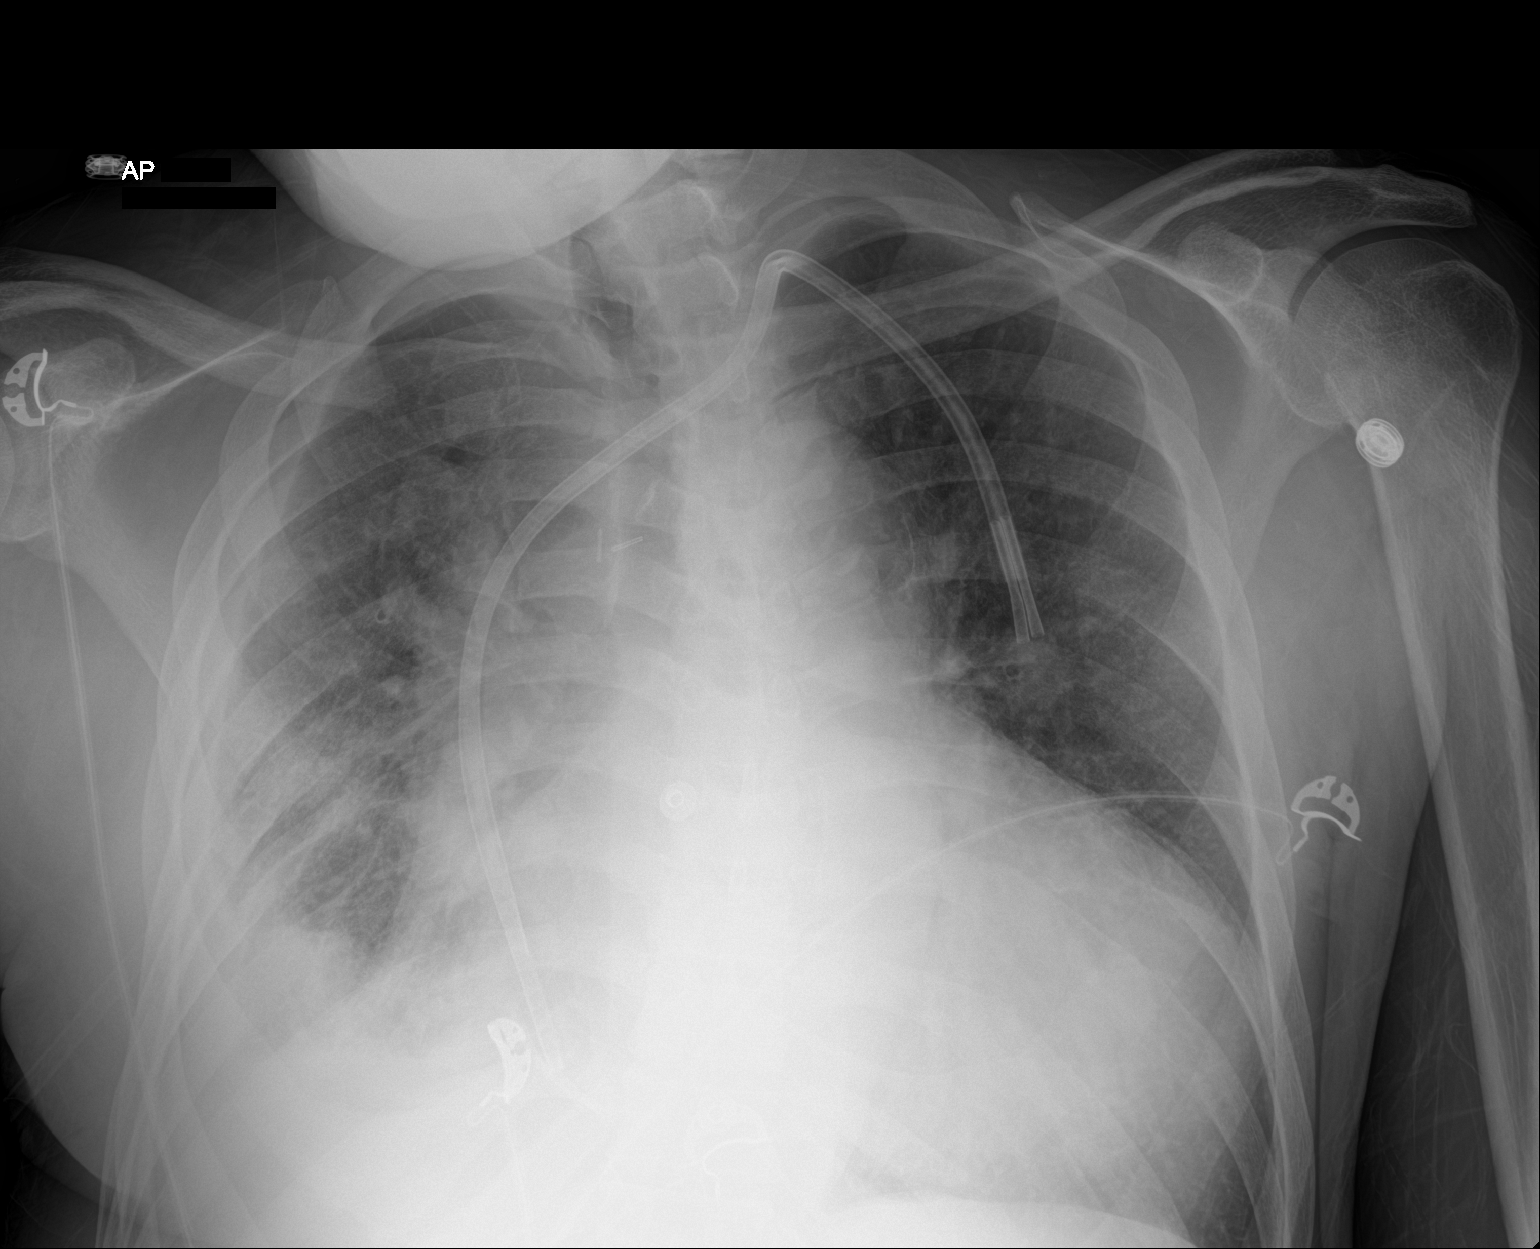

[1 of 1 positions shown; findings below may reference images not displayed]

FINDINGS: Grossly unchanged enlarged cardiac silhouette and mediastinal
contours. Interval placement of left jugular approach dialysis
catheter with tips overlying expected location the superior aspect
the right atrium.

Pulmonary vasculature remains indistinct with cephalization of flow.
Unchanged small partially loculated right-sided pleural effusion
with associated right mid and lower lung slightly nodular
consolidative opacities, unchanged. The left lung remains well
aerated. No new focal airspace opacities. No pneumothorax. No acute
osseous abnormalities.
IMPRESSION: 1. Similar findings of cardiomegaly, pulmonary venous congestion,
chronic likely partially loculated right-sided effusion and right
basilar nodular airspace opacities.
2. Left jugular approach dialysis catheter tips overlie the expected
location of the superior aspect of the right atrium.

## 2022-04-18 IMAGING — CR DG CHEST 2V
2 series · 2 of 2 positions shown · non-contrast
Comparison: November 22, 2019

CLINICAL DATA: History of pancreatitis. Abdominal pain.

EXAM:
CHEST - 2 VIEW

[chest lat]
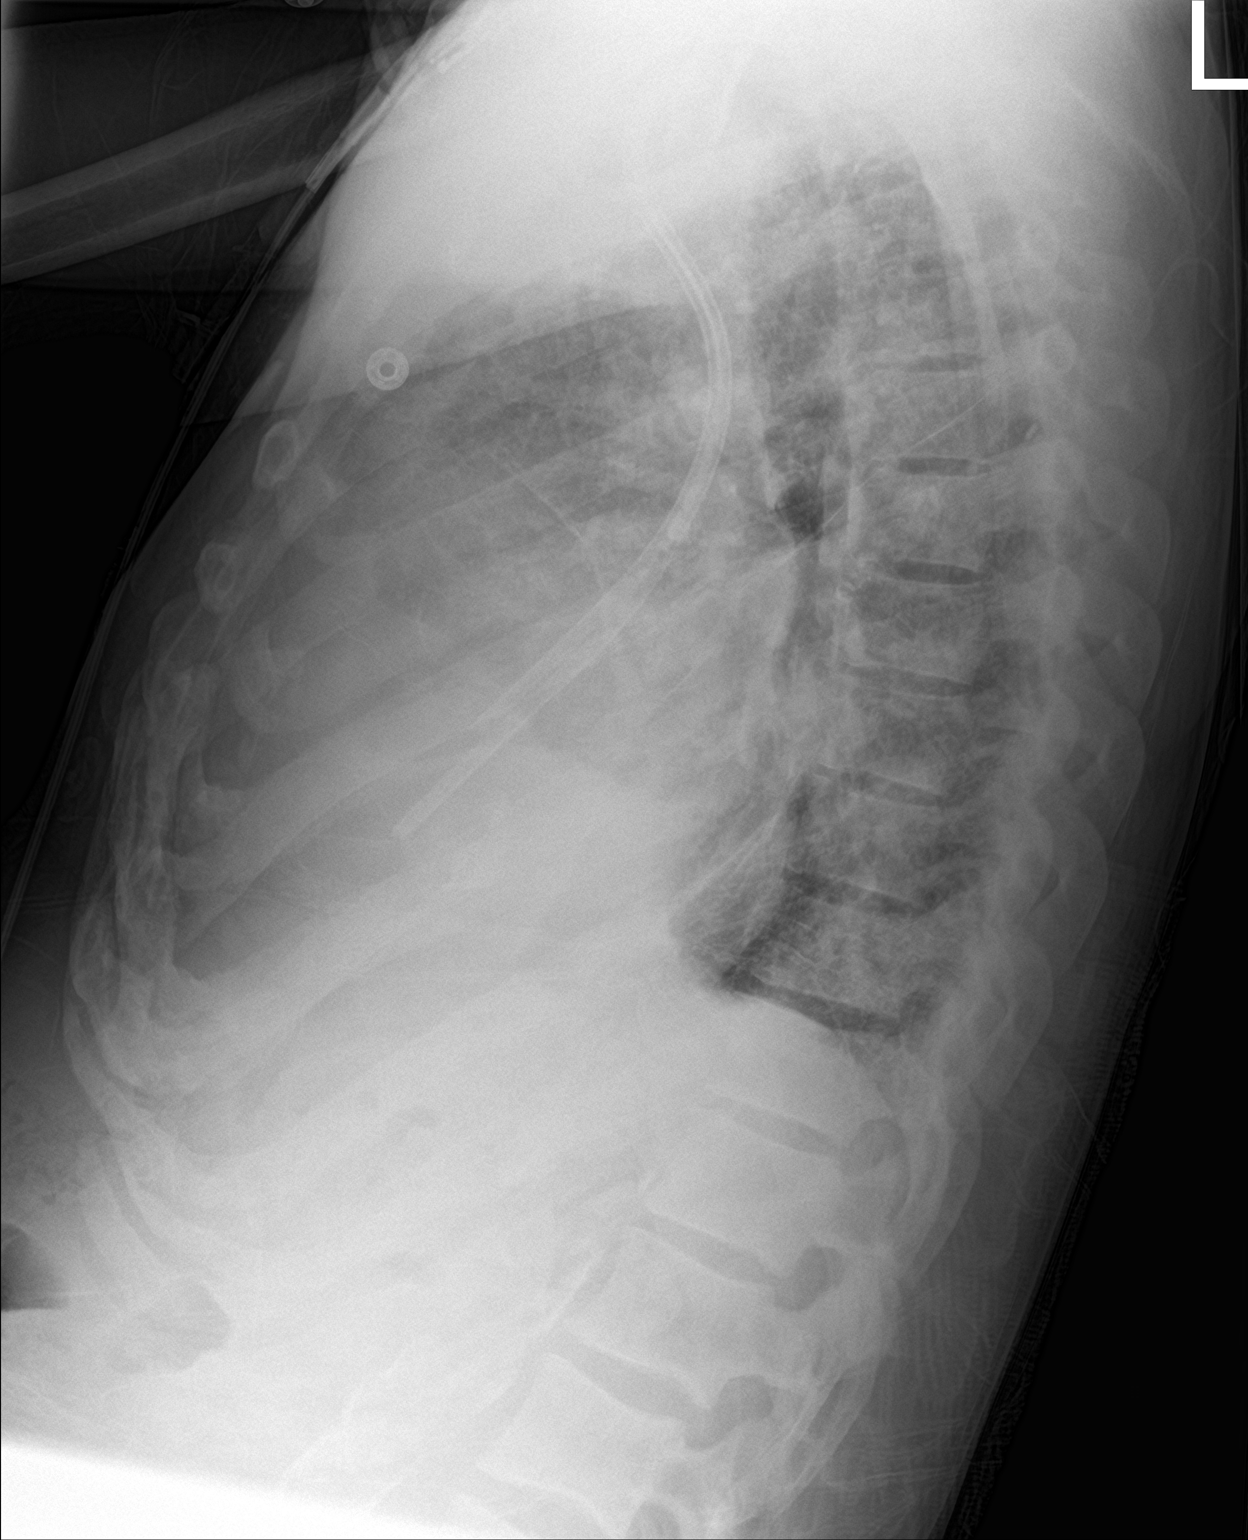

[chest ap]
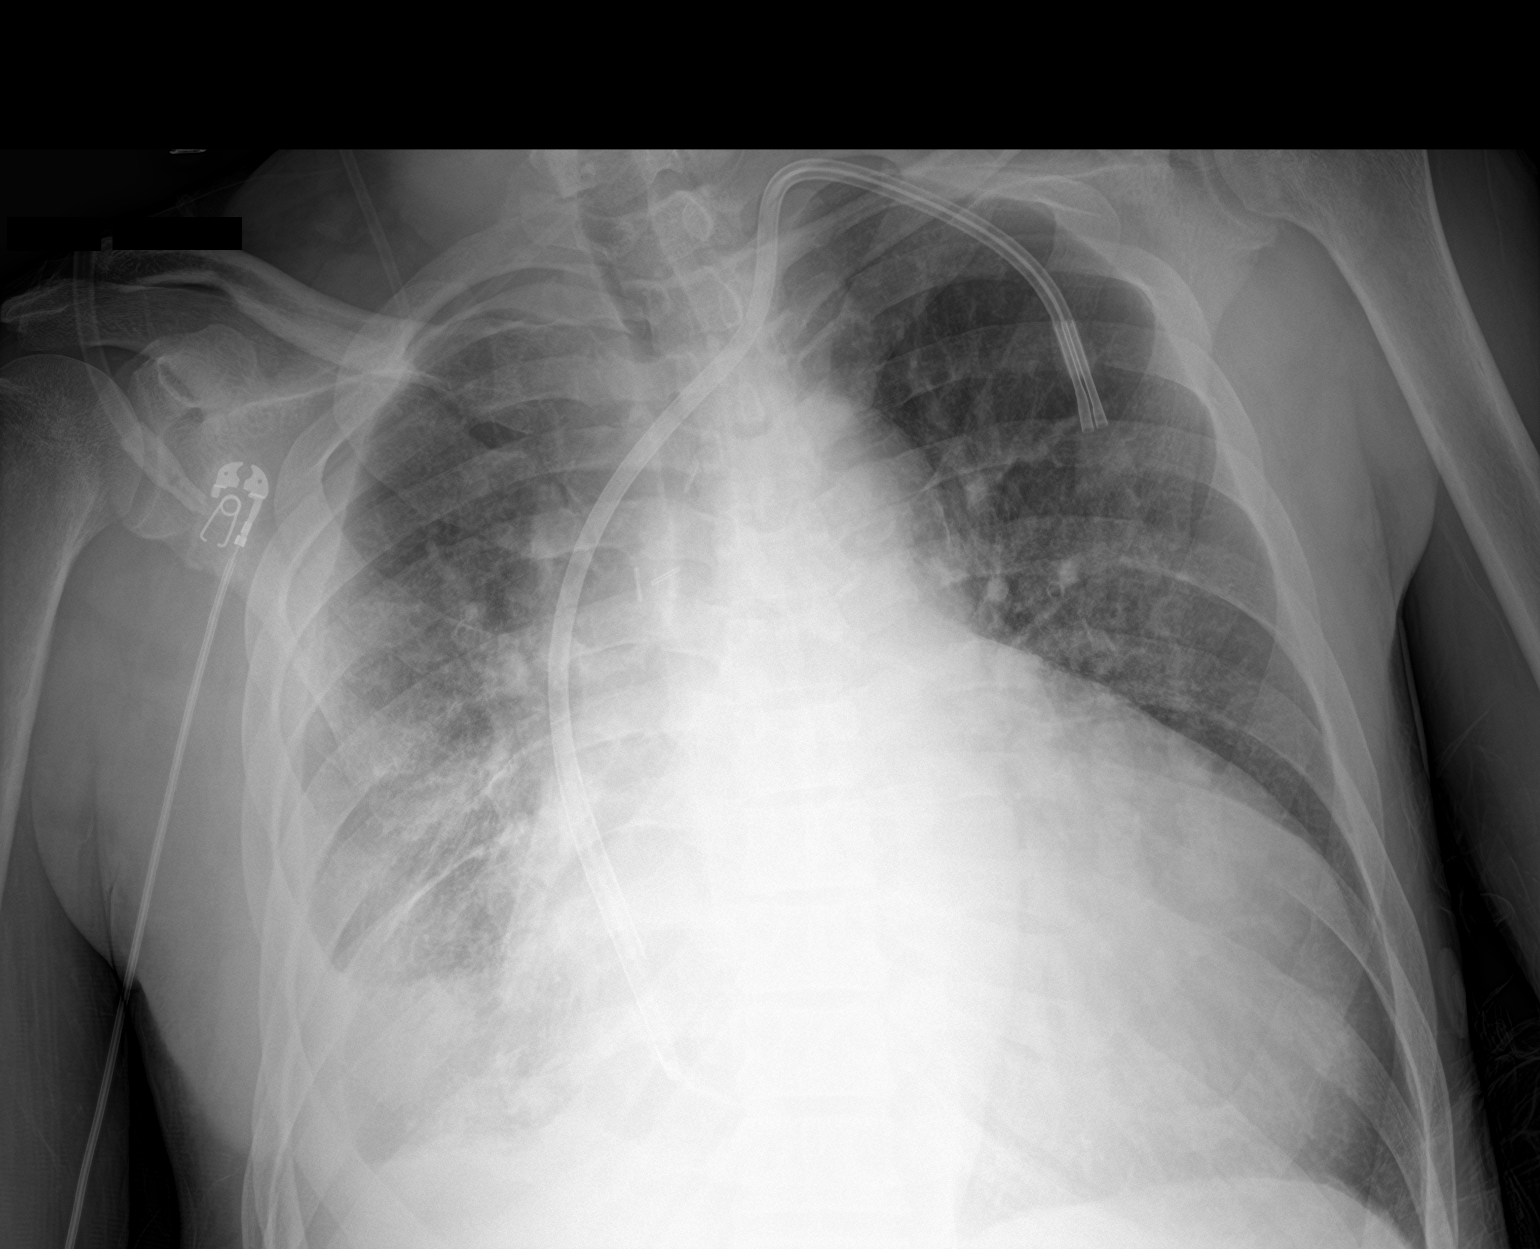

[2 of 2 positions shown; findings below may reference images not displayed]

FINDINGS: Stable appearance of dialysis catheter.

The cardiac silhouette is enlarged.

There is interstitial pulmonary edema.

There is moderate right pleural effusion.

Osseous structures are without acute abnormality. Soft tissues are
grossly normal.
IMPRESSION: 1. Enlarged cardiac silhouette with interstitial pulmonary edema.
2. Moderate right pleural effusion.

## 2022-04-21 IMAGING — DX DG CHEST 1V PORT
1 series · 2 of 2 positions shown · non-contrast
Comparison: 11/22/2019

CLINICAL DATA: Missed dialysis

EXAM:
PORTABLE CHEST 1 VIEW

[Series 1: chest · 0.14mm/px · 2 of 2 slices shown]
[im 1/2]
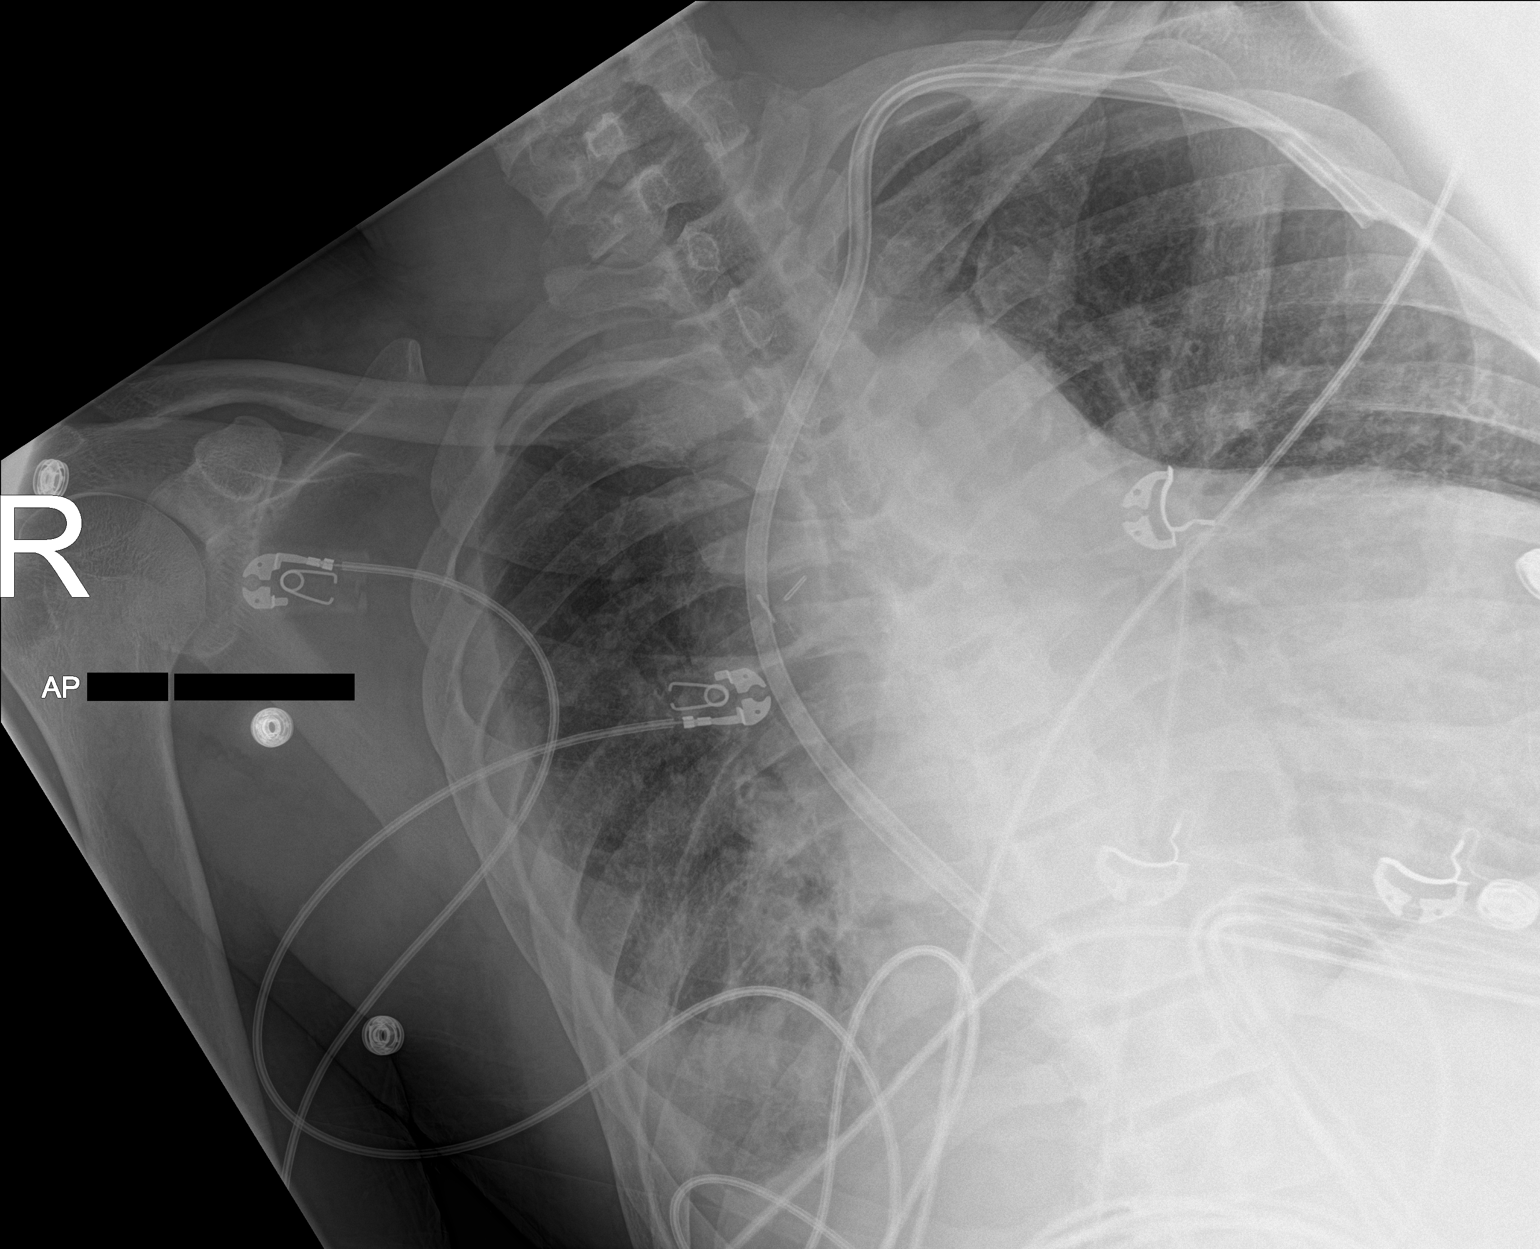
[im 2/2]
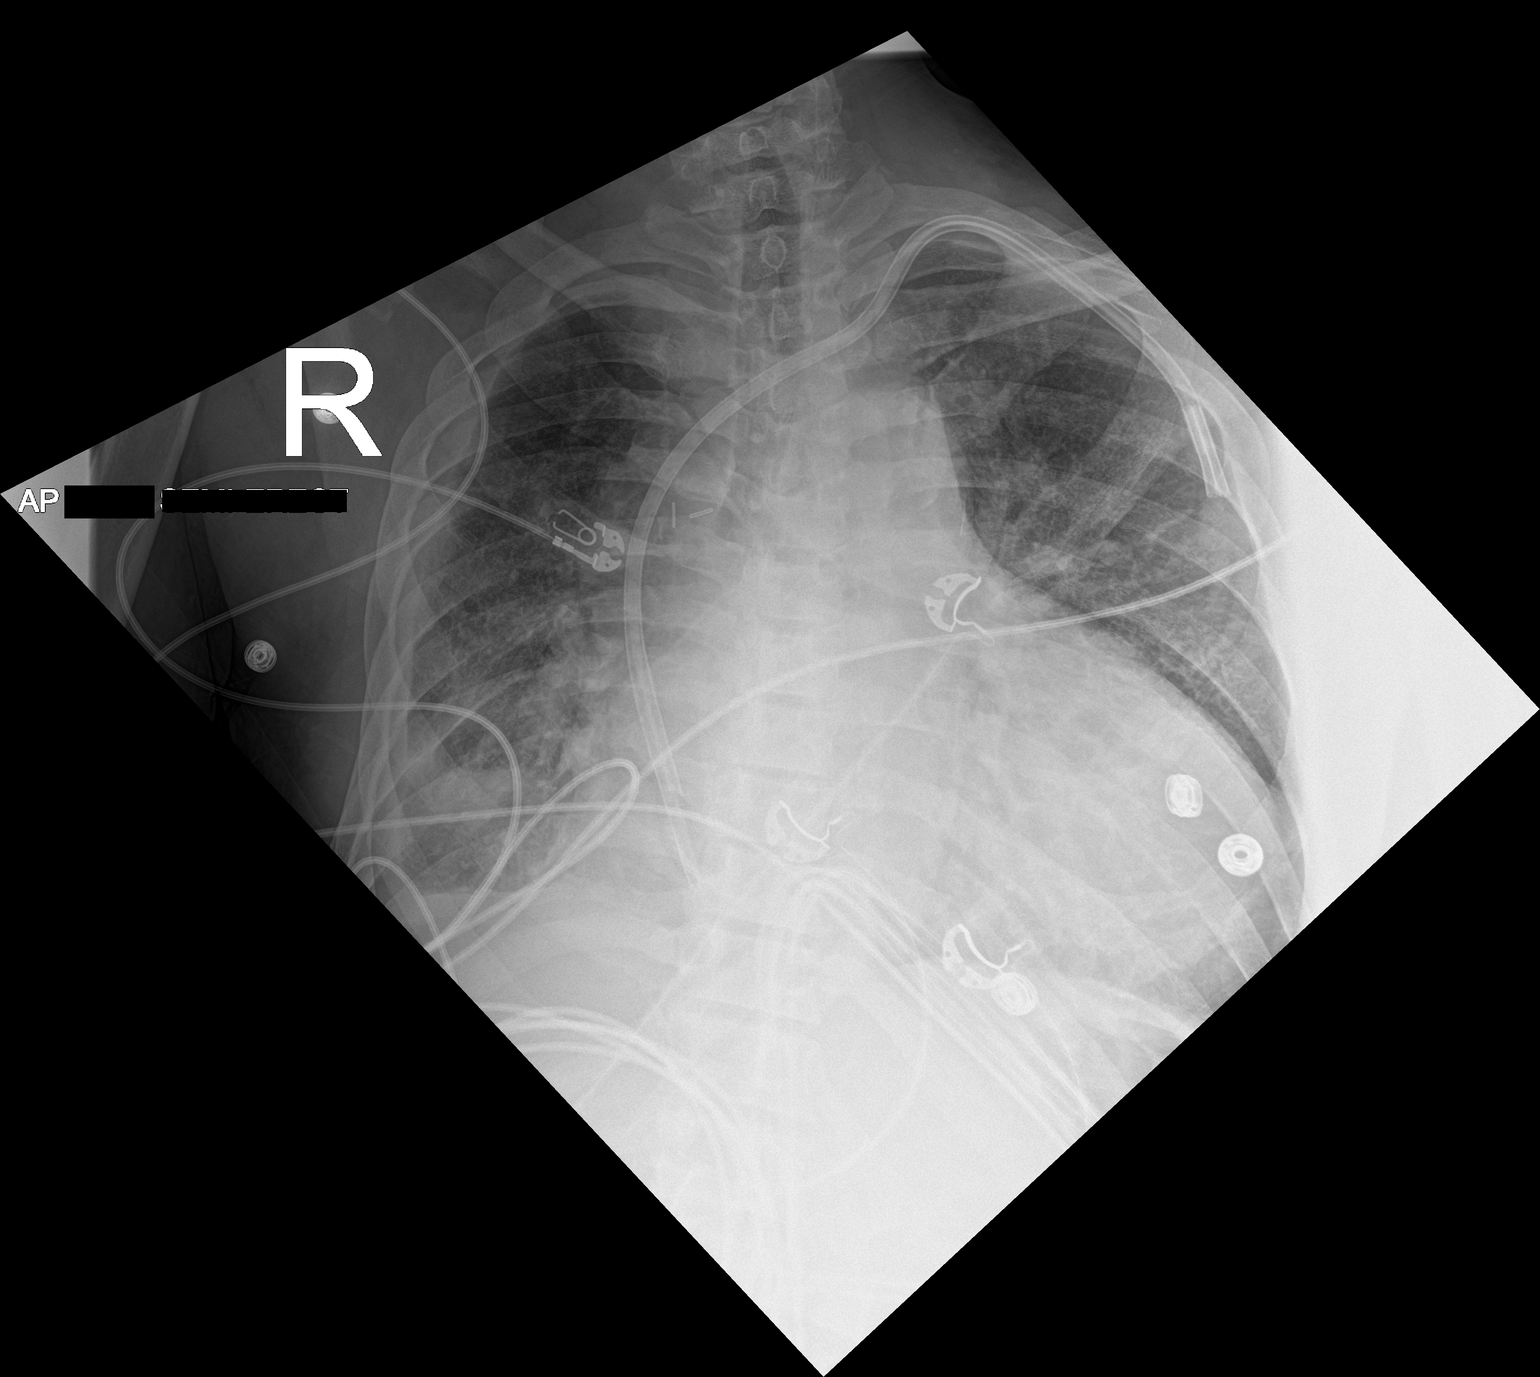

[2 of 2 positions shown; findings below may reference images not displayed]

FINDINGS: Chronic cardiopericardial enlargement. Clips project at the right
hilum. Diffuse interstitial coarsening with small volume pleural
fluid and thickening on the right. No generalized Kerley lines. No
pneumothorax.
IMPRESSION: Chronic cardiomegaly and right pleural thickening/scarring. No acute
finding when compared with multiple priors.

## 2022-05-03 IMAGING — DX DG CHEST 2V
2 series · 2 of 2 positions shown · non-contrast
Comparison: 12/01/2019

CLINICAL DATA: Shortness of breath

EXAM:
CHEST - 2 VIEW

[chest lat]
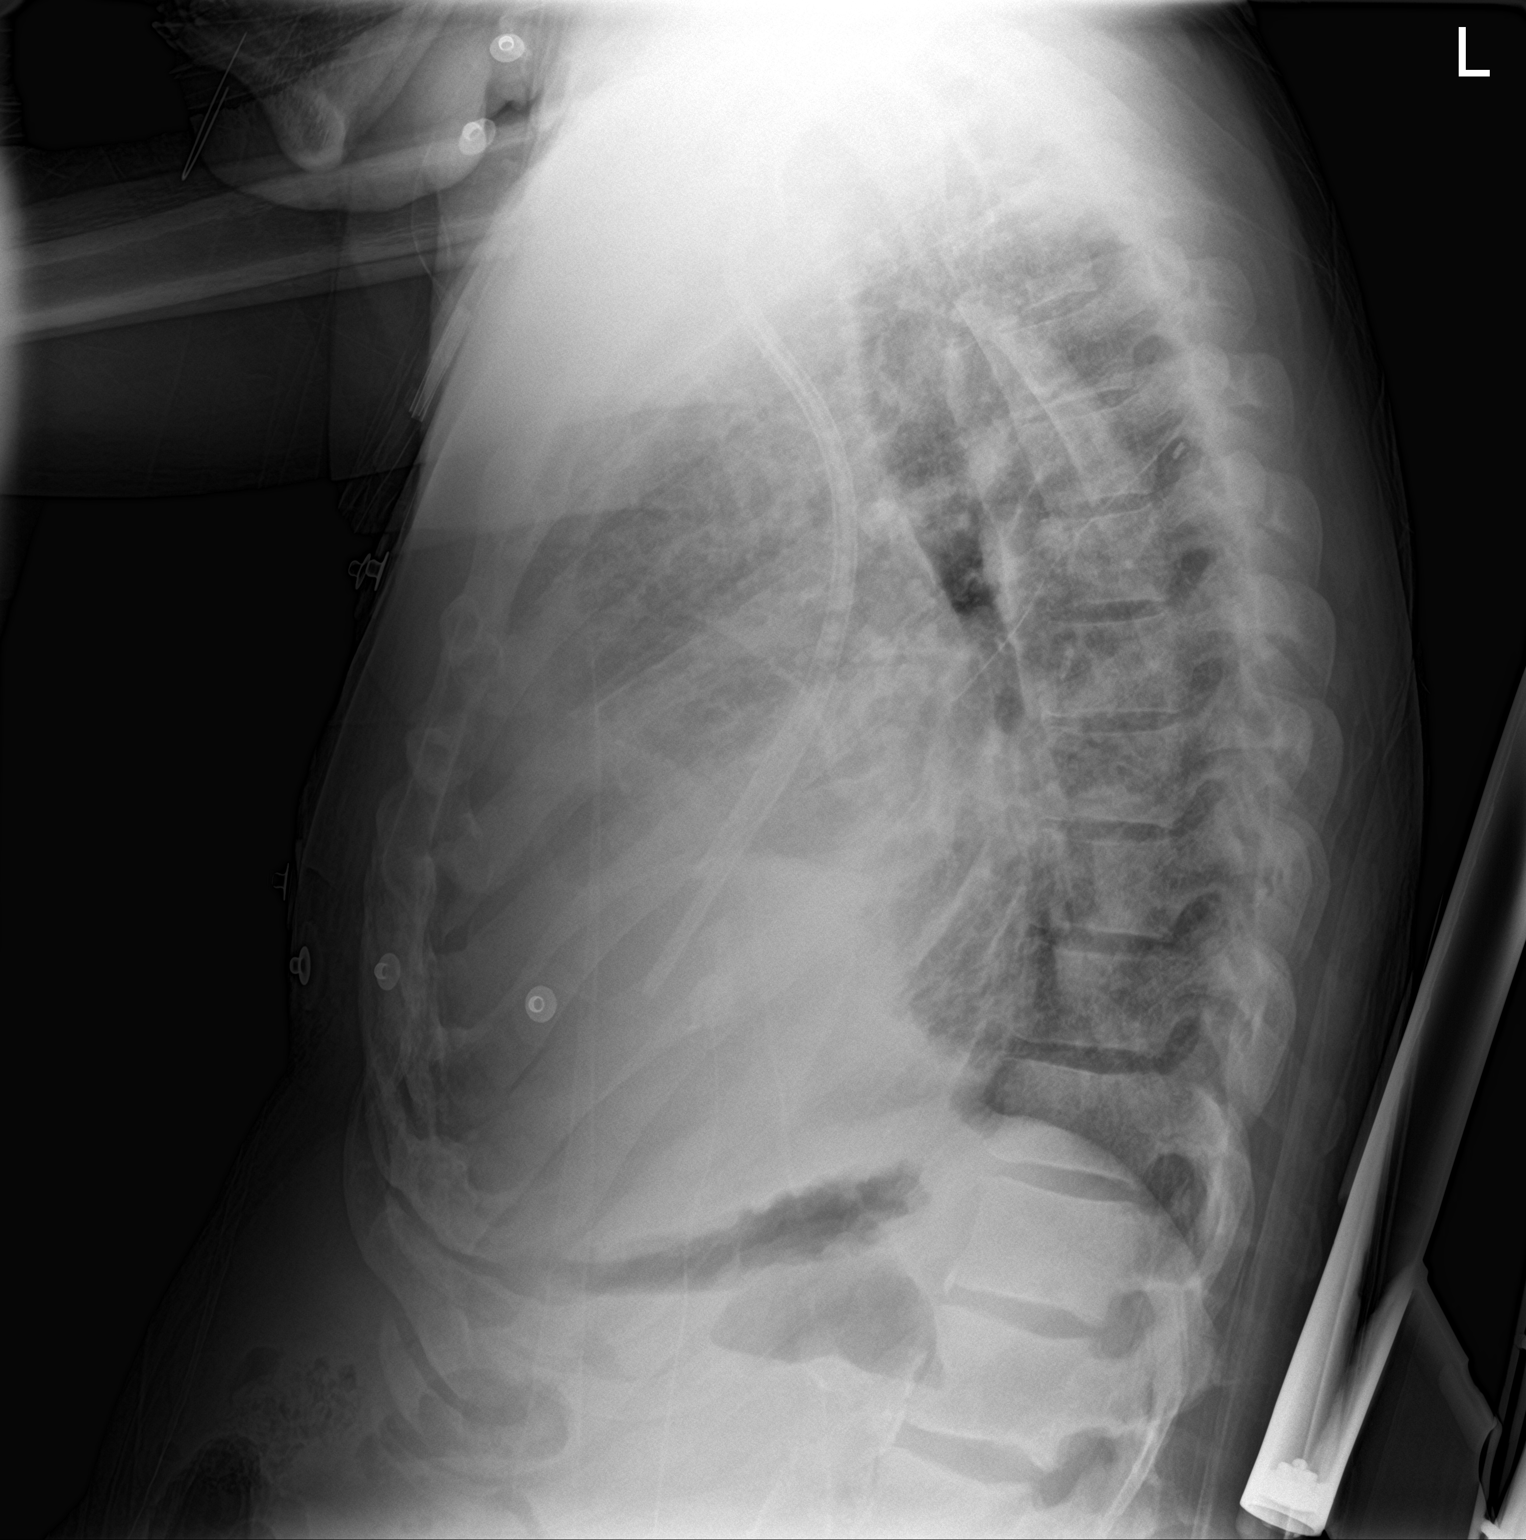

[chest ap]
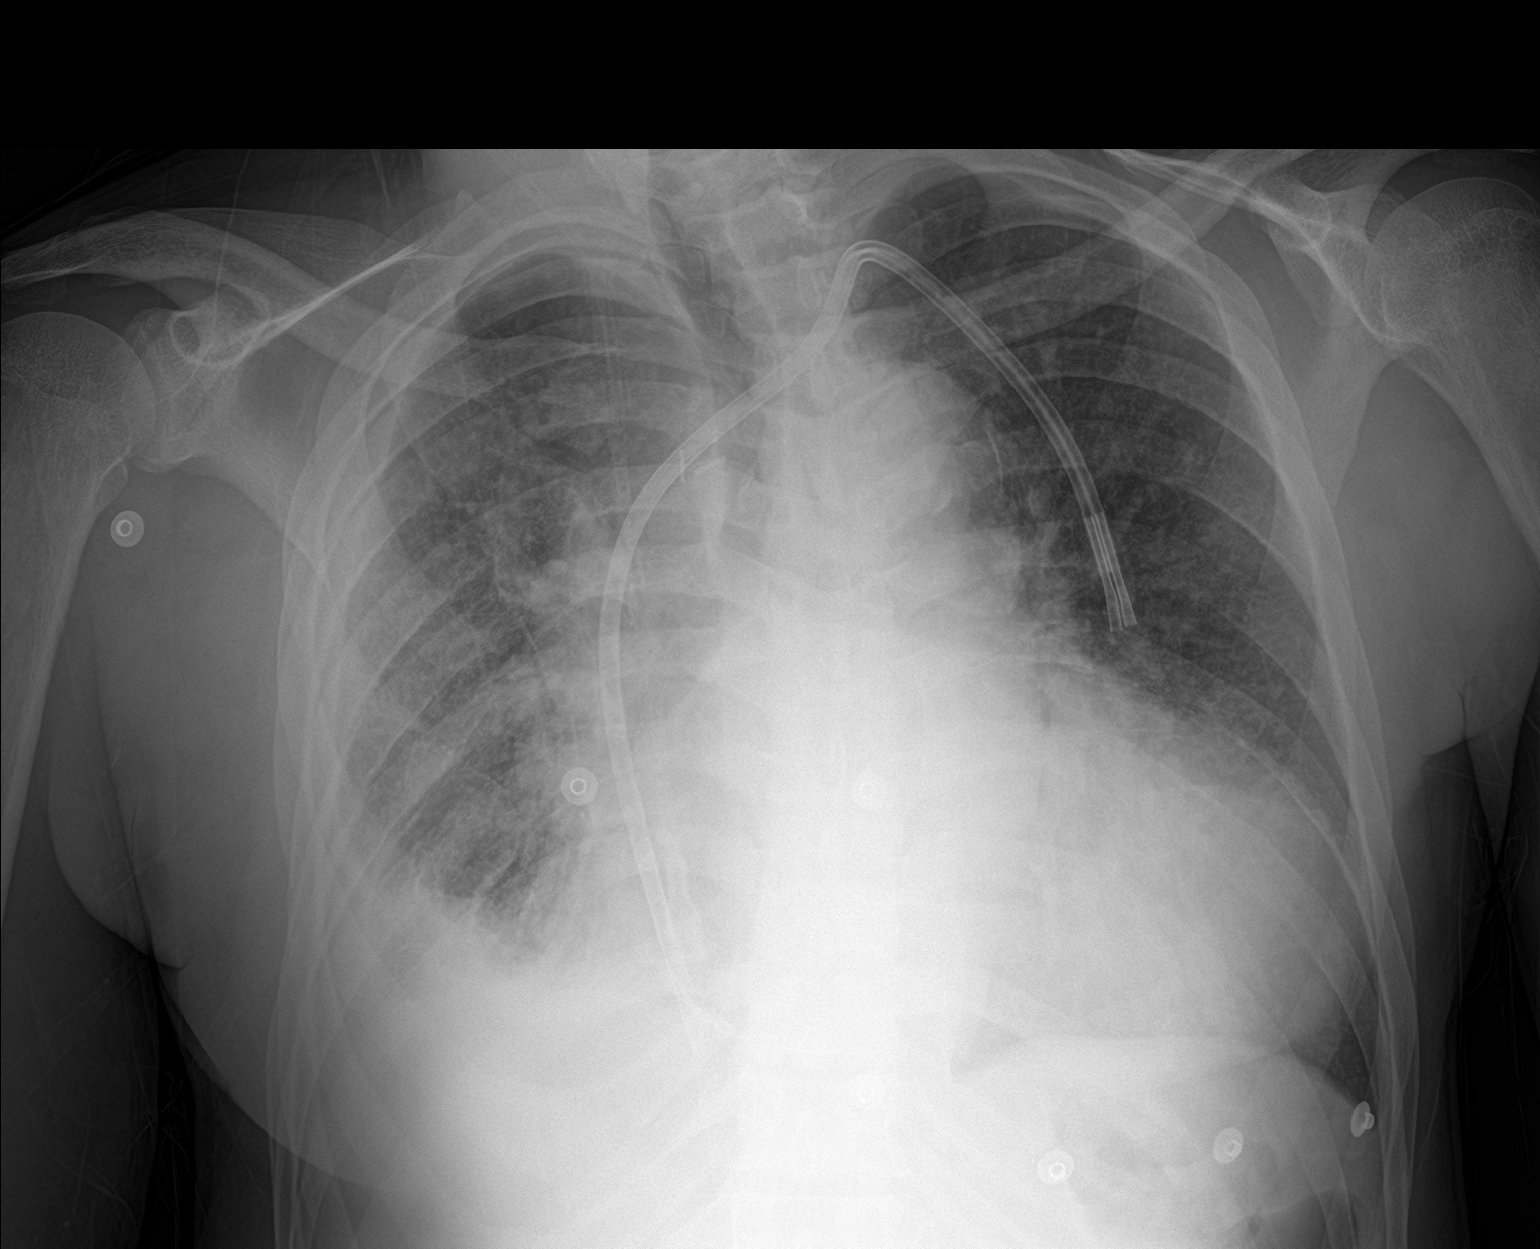

[2 of 2 positions shown; findings below may reference images not displayed]

FINDINGS: There is a well-positioned left-sided tunneled dialysis catheter.
The heart size is enlarged. There is a moderate-sized right-sided
pleural effusion with adjacent airspace disease favored to represent
atelectasis. There is vascular congestion with developing pulmonary
edema. There is no pneumothorax. Aortic calcifications are noted.
There is evidence for renal osteodystrophy.
IMPRESSION: 1. Moderate-sized right-sided pleural effusion with adjacent
airspace disease favored to represent atelectasis.
2. Cardiomegaly with vascular congestion and developing pulmonary
edema.

## 2022-05-20 IMAGING — CT CT ABD-PELV W/O CM
2 of 4 series · 17 of 46 positions shown, 19 images · non-contrast
Comparison: 10/05/2019

CLINICAL DATA: Diffuse abdominal pain, distention

EXAM:
CT ABDOMEN AND PELVIS WITHOUT CONTRAST
TECHNIQUE: Multidetector CT imaging of the abdomen and pelvis was performed
following the standard protocol without IV contrast.

[Series 2: a/p w/o 5mm · axial · non-contrast · 0.67mm/px · z∈[+1318,+1708]mm · 14 of 86 slices shown, 16 images]
[im 4/86  soft-tissue]
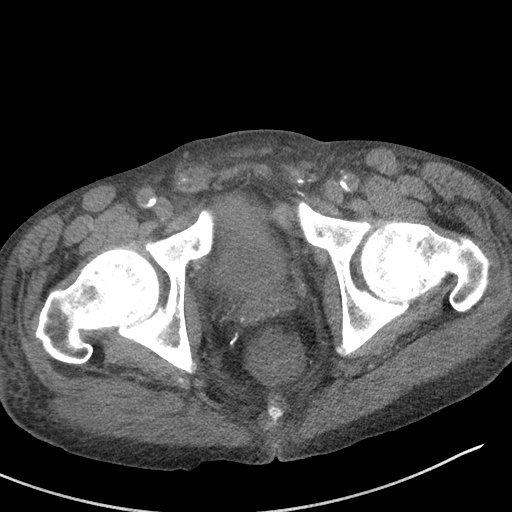
[im 4/86  bone]
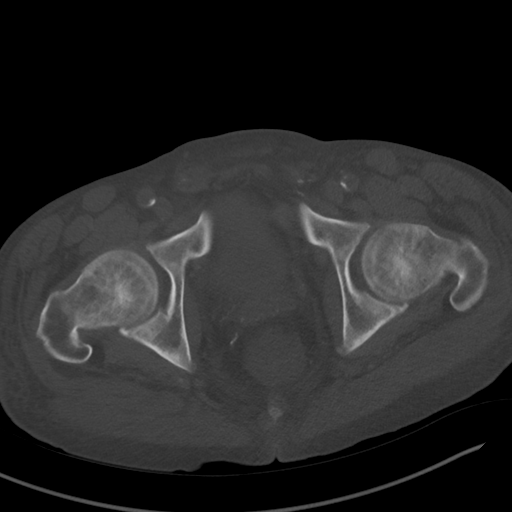
[im 12/86  soft-tissue]
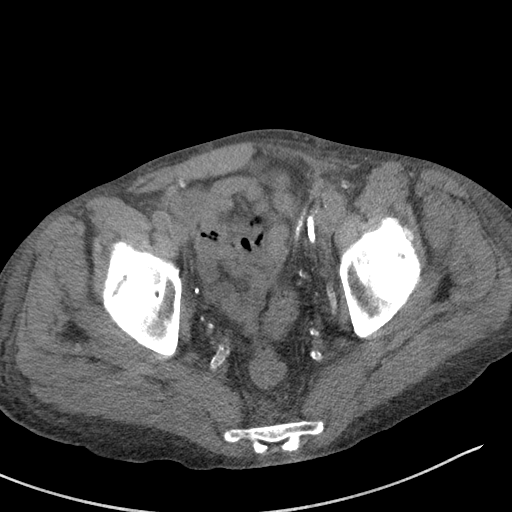
[im 15/86  soft-tissue]
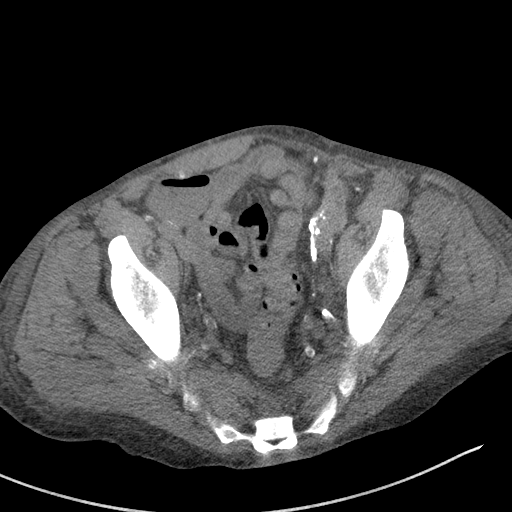
[im 23/86  soft-tissue]
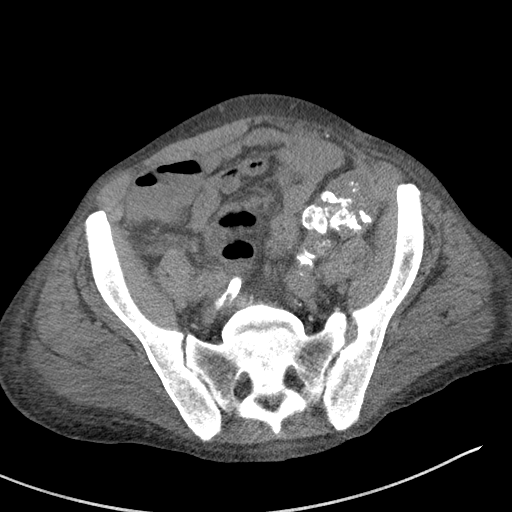
[im 30/86  soft-tissue]
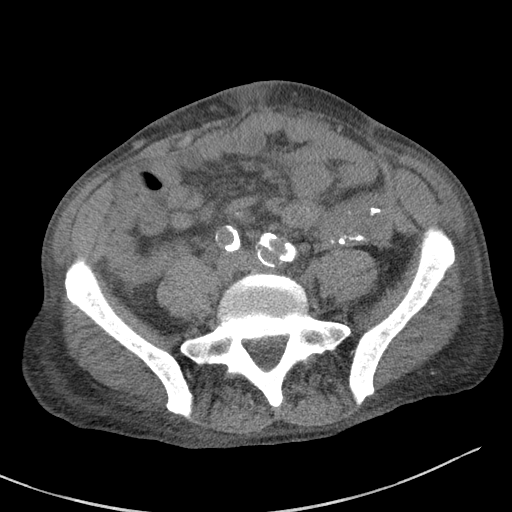
[im 34/86  soft-tissue]
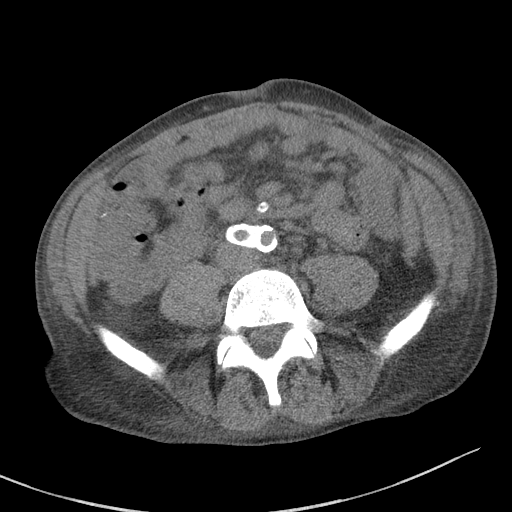
[im 41/86  soft-tissue]
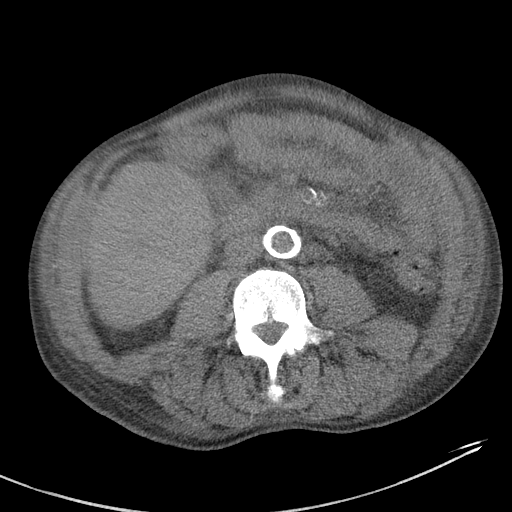
[im 45/86  soft-tissue]
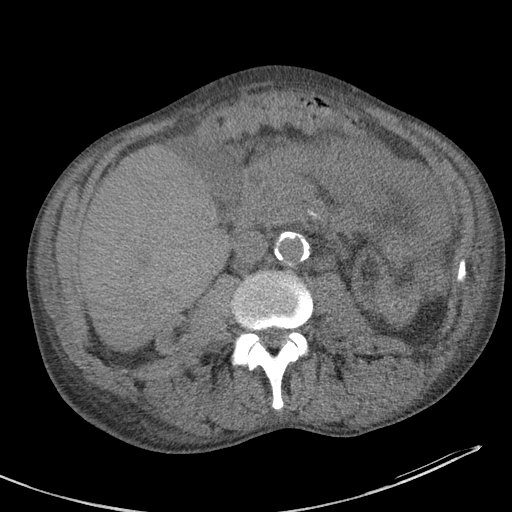
[im 52/86  soft-tissue]
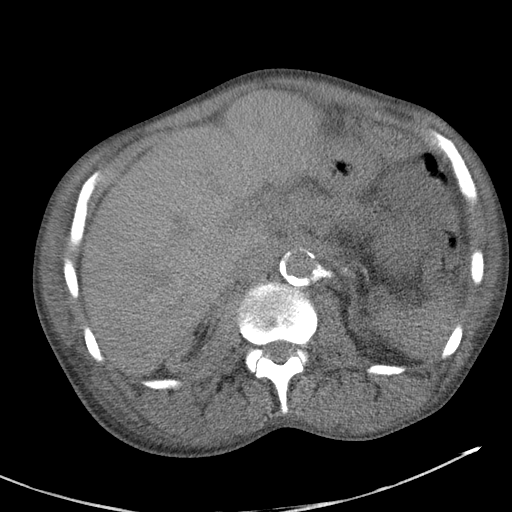
[im 52/86  bone]
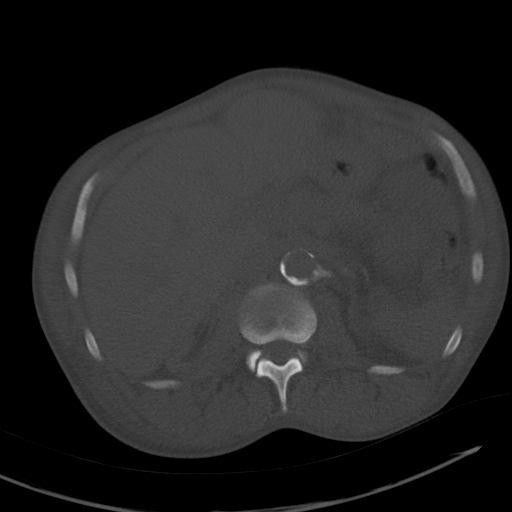
[im 56/86  soft-tissue]
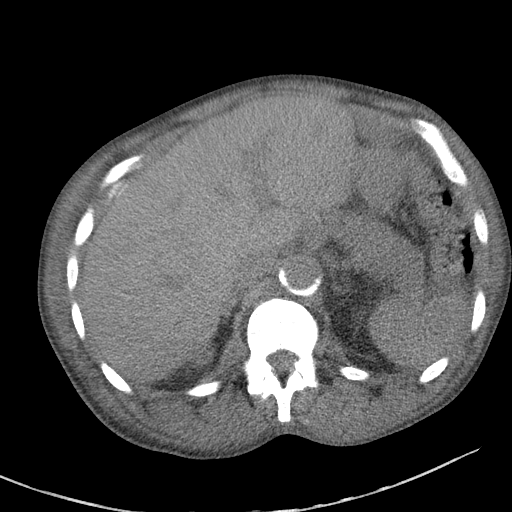
[im 63/86  soft-tissue]
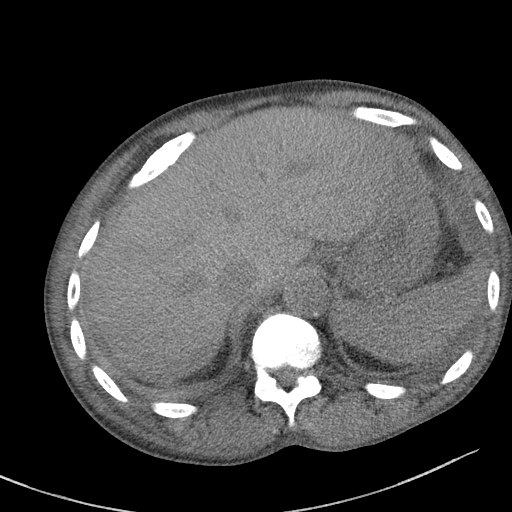
[im 71/86  soft-tissue]
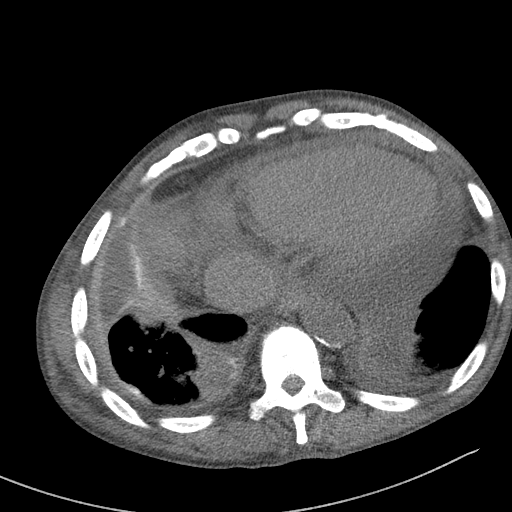
[im 74/86  soft-tissue]
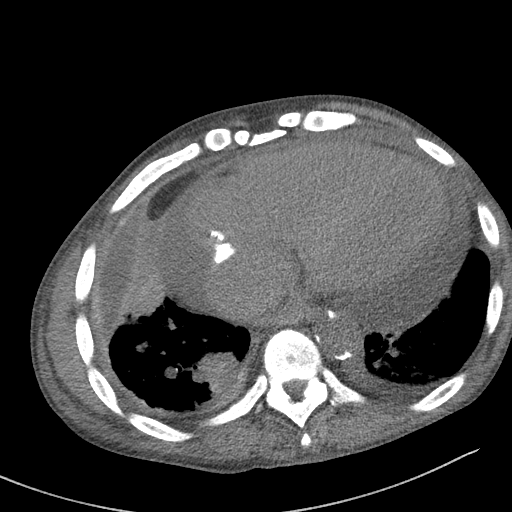
[im 82/86  soft-tissue]
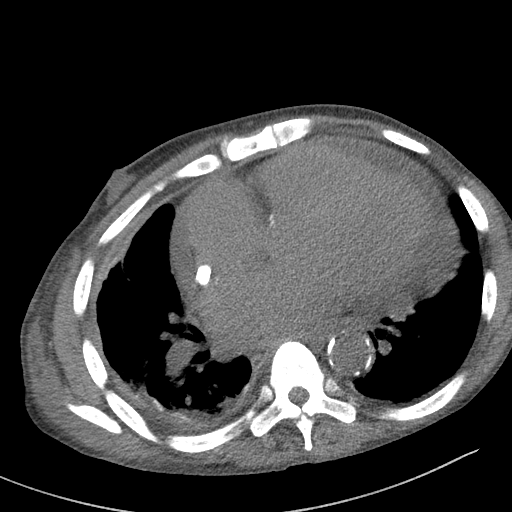

[Series 5: a/p w/o cor · coronal · non-contrast · 0.81mm/px · 3 of 151 slices shown]
[im 51/151  soft-tissue]
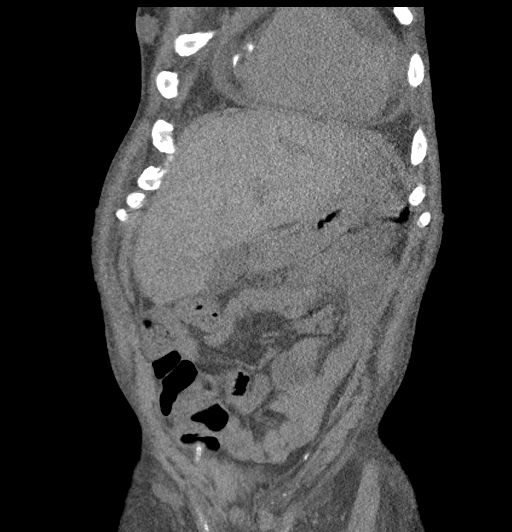
[im 67/151  soft-tissue]
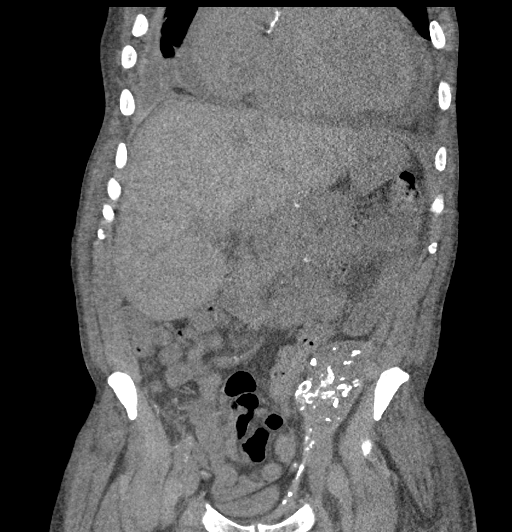
[im 84/151  soft-tissue]
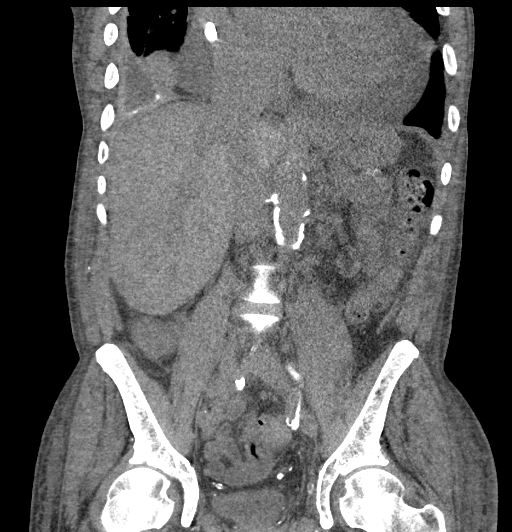

[17 of 46 positions shown; findings below may reference images not displayed]

FINDINGS: Lower chest: Marked cardiomegaly. Small loculated right pleural
effusion with airspace disease in the right lower lung, stable since
prior study. Left base atelectasis or infiltrate. Small to moderate
pericardial effusion.

Hepatobiliary: No focal hepatic abnormality. Gallbladder
unremarkable.

Pancreas: No focal abnormality or ductal dilatation.

Spleen: No focal abnormality.  Normal size.

Adrenals/Urinary Tract: 3 cm mildly hyperdense lesion off the lower
pole of the left kidney, unchanged. Kidneys are atrophic. Adrenal
glands and urinary bladder unremarkable.

Stomach/Bowel: Stomach, large and small bowel grossly unremarkable.

Vascular/Lymphatic: Heavily calcified aorta and branch vessels. No
aneurysm or adenopathy.

Reproductive: No visible focal abnormality.

Other: No free fluid or free air. Oblong soft tissue density with
calcifications again noted in the left pelvis, likely failed renal
transplant. This has a stable appearance when compared to prior
study.

Musculoskeletal: No acute bony abnormality.
IMPRESSION: Small loculated right pleural effusion with right lower lobe
airspace disease/infiltrates, unchanged since prior study.

New left basilar atelectasis or infiltrate.

Cardiomegaly, small to moderate pericardial effusion.

No acute findings in the abdomen or pelvis.

Stable slightly hyperdense lesion off the atrophic left kidney. This
could reflect hemorrhagic cyst but cannot be characterized on this
noncontrast study.

Aortic atherosclerosis.

## 2022-05-20 IMAGING — DX DG CHEST 1V PORT
1 series · 1 of 1 positions shown · non-contrast
Comparison: 12/13/2019 chest radiograph.

CLINICAL DATA: Missed dialysis.  Dyspnea.

EXAM:
PORTABLE CHEST 1 VIEW

[chest]
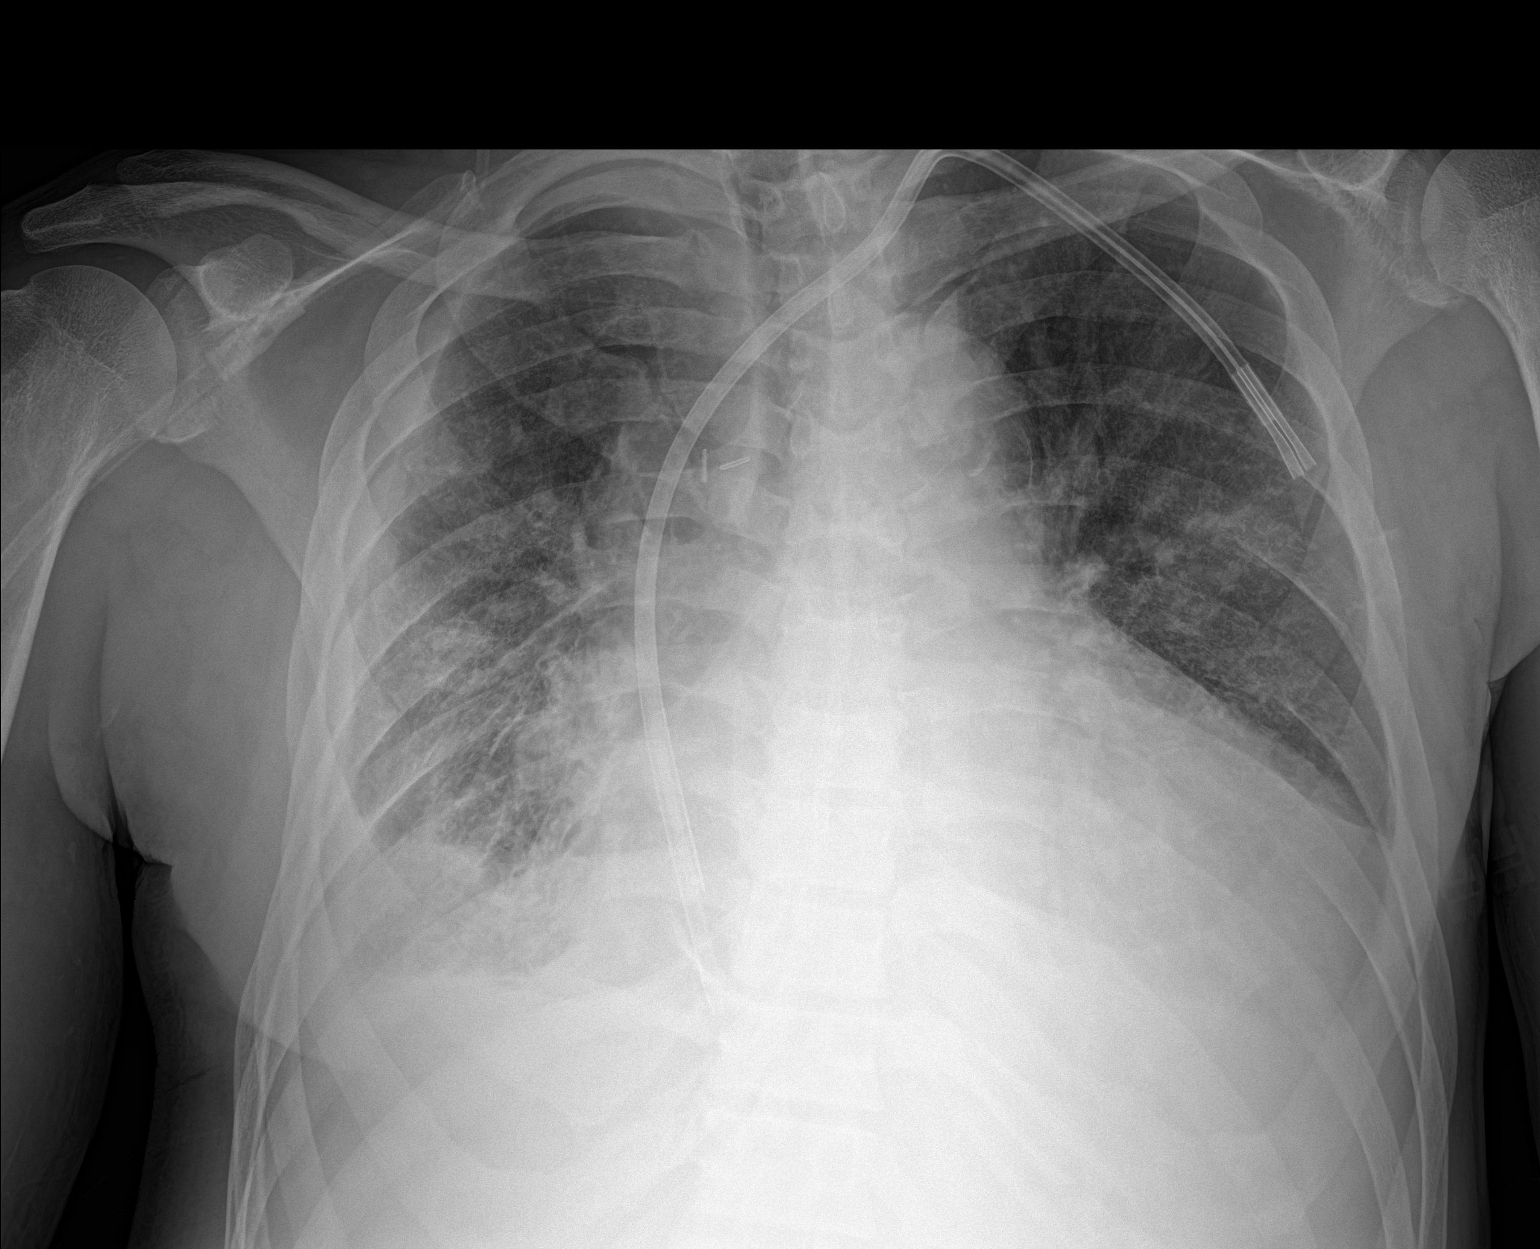

[1 of 1 positions shown; findings below may reference images not displayed]

FINDINGS: Left internal jugular central venous catheter terminates over the
right atrium. Stable cardiomediastinal silhouette with moderate
cardiomegaly. No pneumothorax. Diffuse smooth right pleural
thickening, similar to prior. New small left pleural effusion.
Diffuse parahilar linear and hazy opacities, favor mild-to-moderate
pulmonary edema. Patchy bibasilar opacities.
IMPRESSION: 1. Moderate cardiomegaly with mild-to-moderate pulmonary edema.
2. New small left pleural effusion.
3. Chronic diffuse smooth right pleural thickening.
4. Patchy bibasilar opacities, favor atelectasis.

## 2022-05-22 IMAGING — DX DG CHEST 1V PORT
1 series · 1 of 1 positions shown · non-contrast
Comparison: 12/30/2019

CLINICAL DATA: Cough and shortness of breath.

EXAM:
PORTABLE CHEST 1 VIEW

[chest ap]
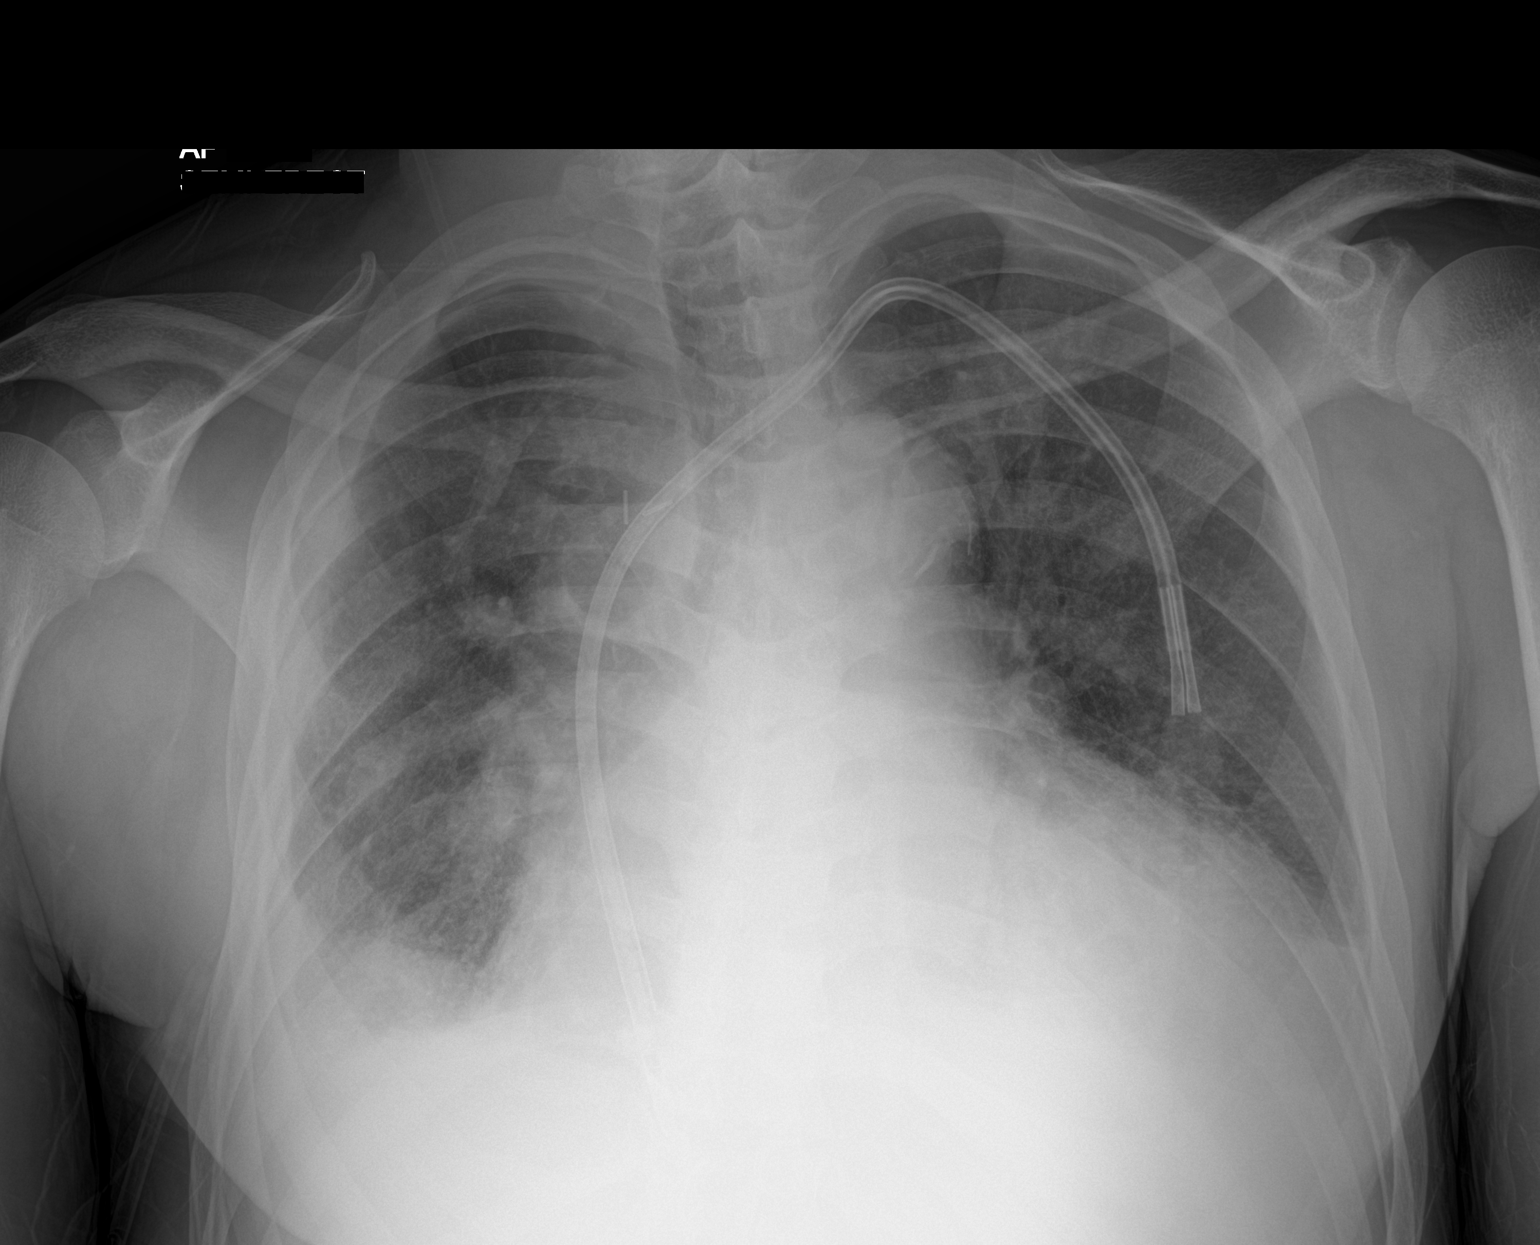

[1 of 1 positions shown; findings below may reference images not displayed]

FINDINGS: Left IJ dialysis catheter unchanged with tip over the right atrium.
Lungs are somewhat hypoinflated with stable bibasilar opacification
likely small bilateral pleural effusions with associated basilar
atelectasis. Infection in the lung bases is possible. Stable
cardiomegaly. Remainder the exam is unchanged.
IMPRESSION: 1. Stable bibasilar opacification likely small effusions with
atelectasis. Infection in the lung bases less likely.

2.  Stable cardiomegaly.

## 2022-05-30 IMAGING — DX DG ANKLE COMPLETE 3+V*L*
3 series · 3 of 3 positions shown · non-contrast
Comparison: None.

CLINICAL DATA: Left ankle pain and swelling.

EXAM:
LEFT ANKLE COMPLETE - 3+ VIEW

[ankle ap]
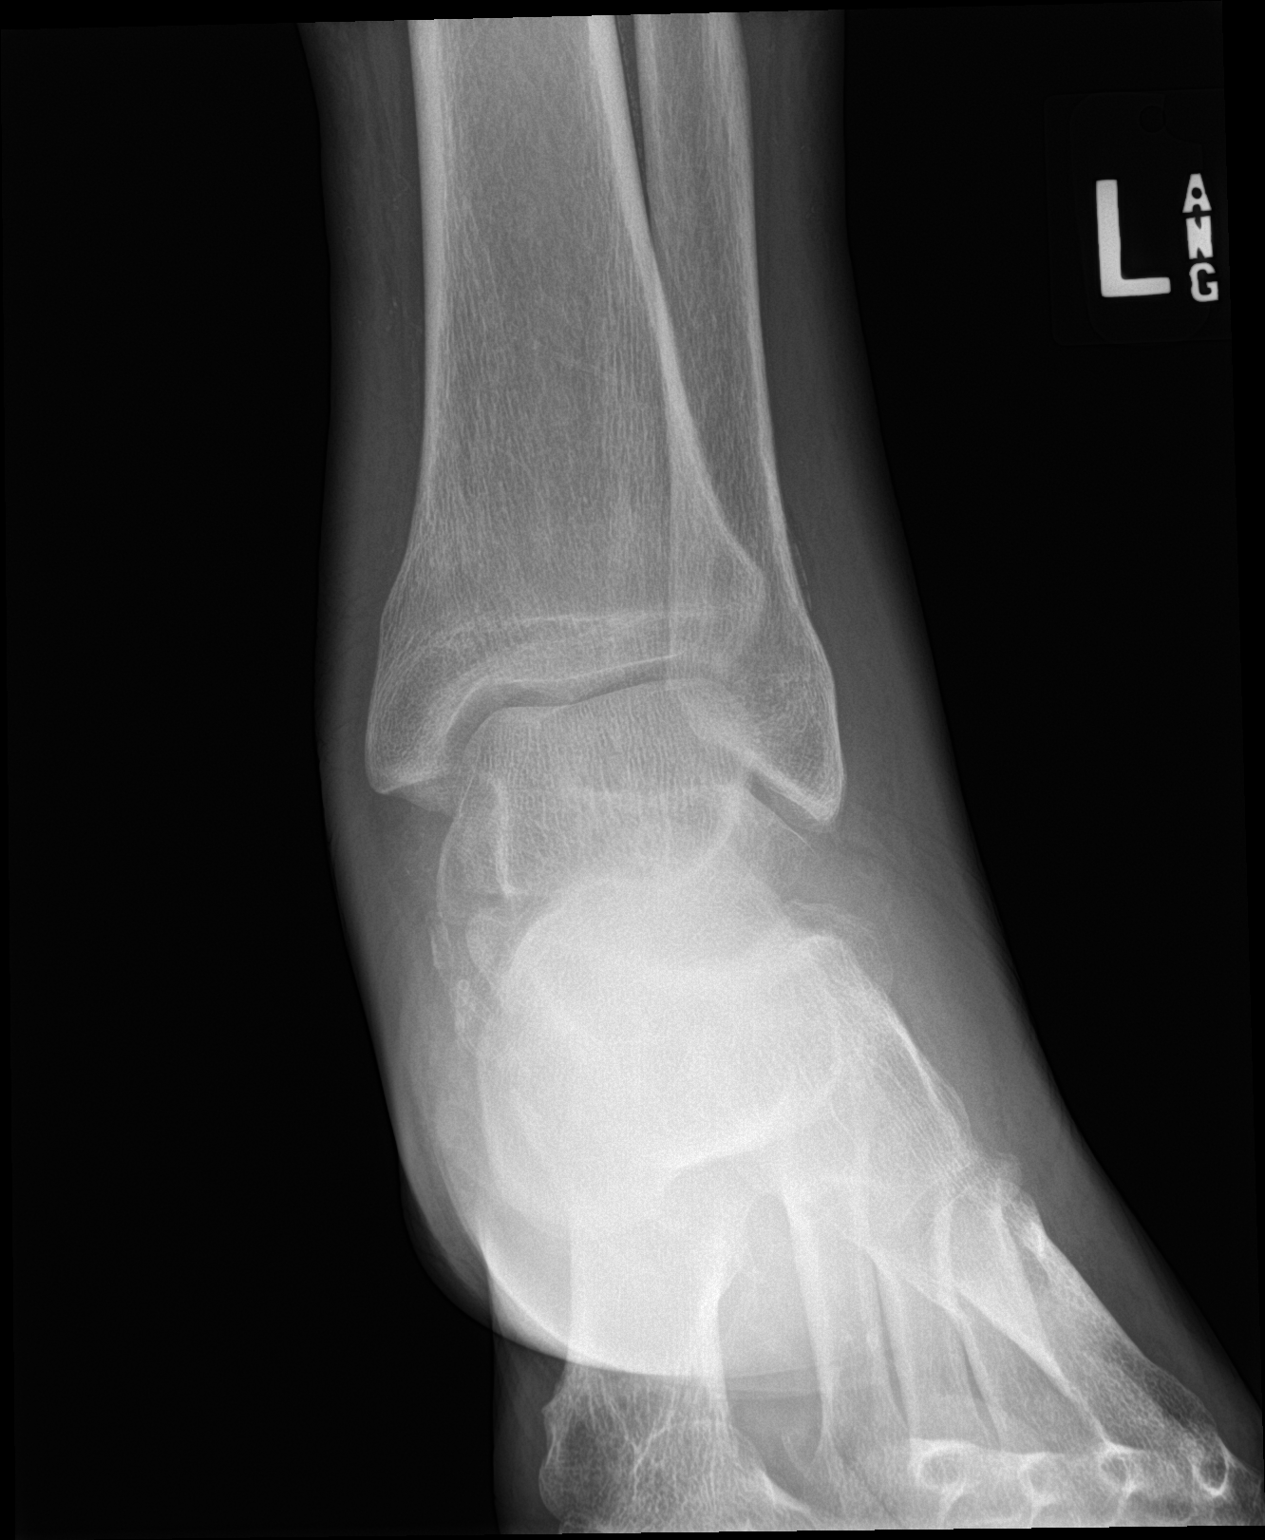

[ankle obl]
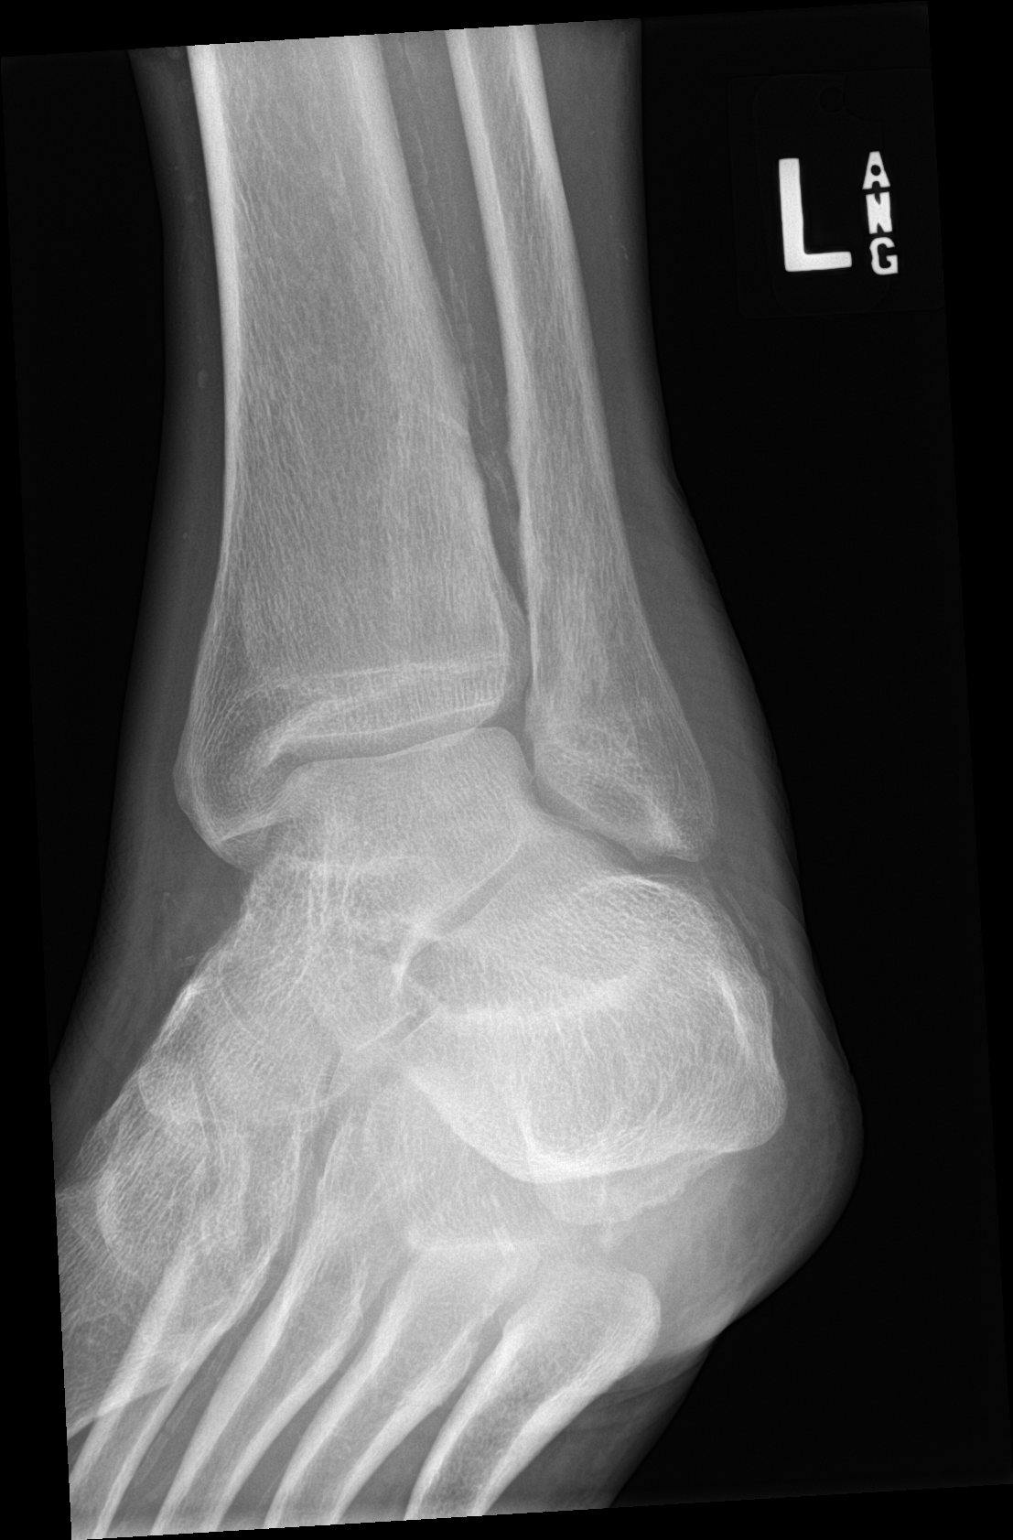

[ankle lat]
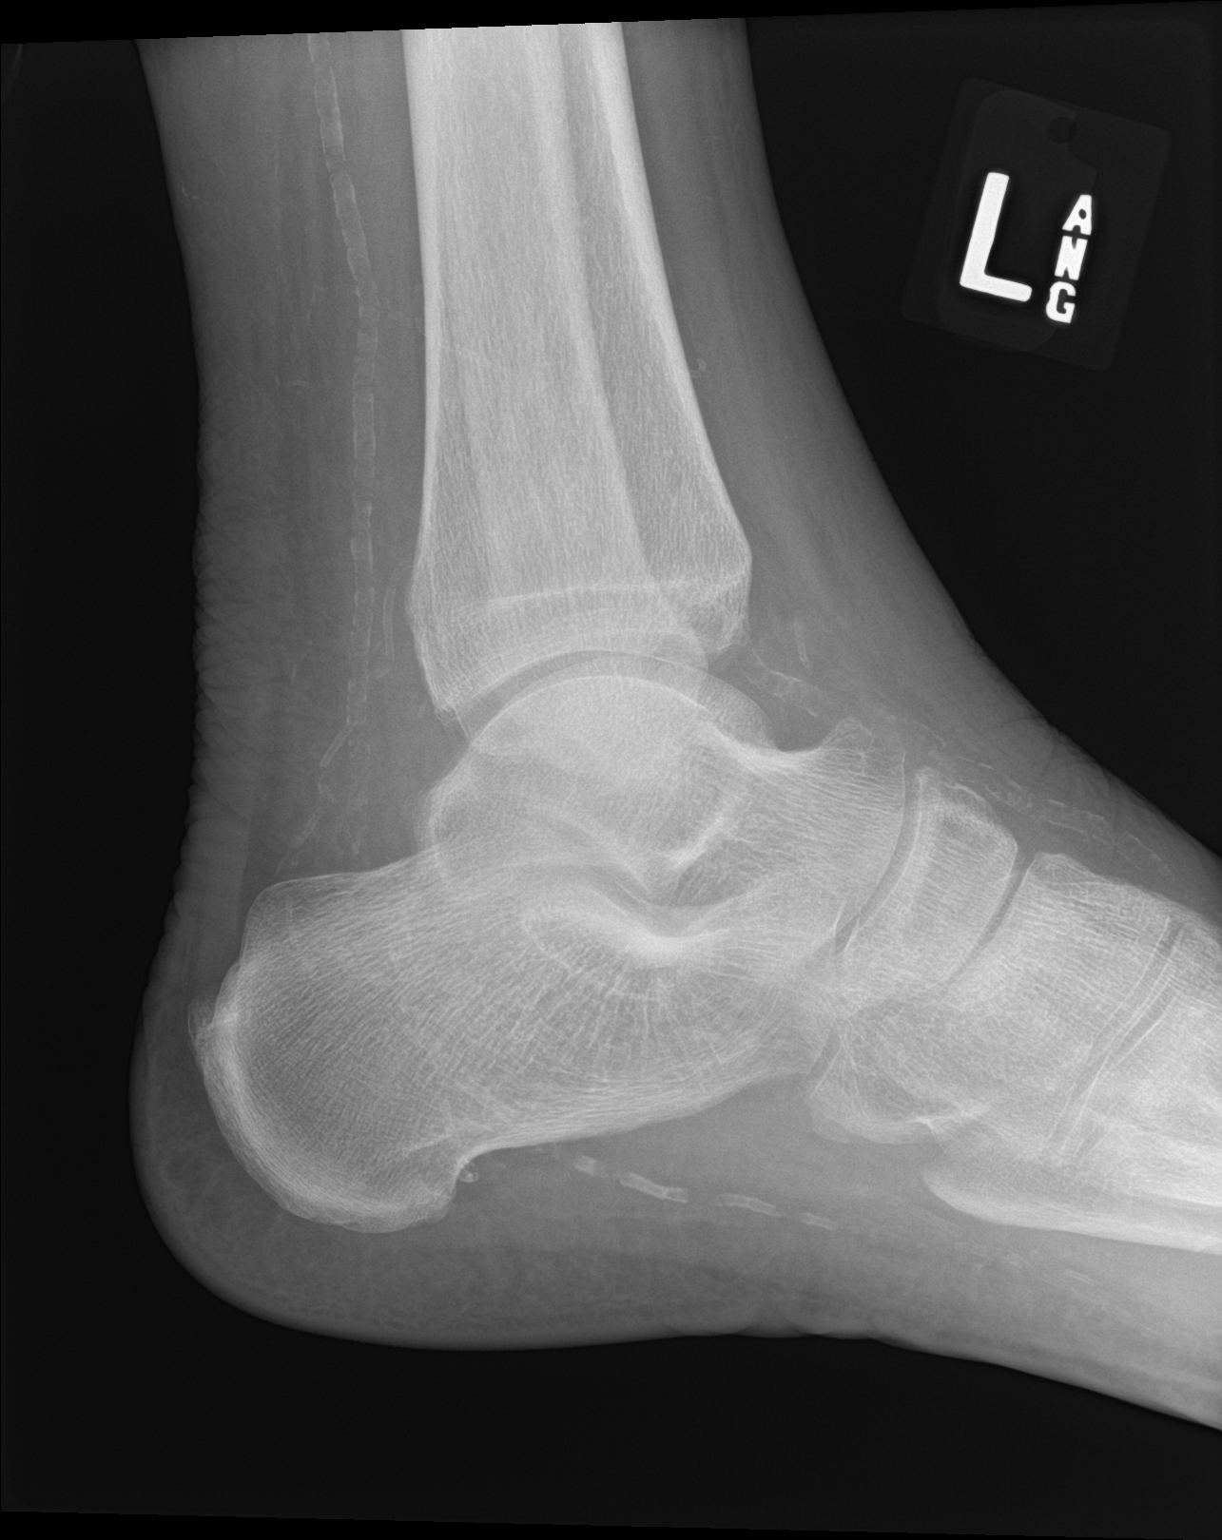

[3 of 3 positions shown; findings below may reference images not displayed]

FINDINGS: There is no evidence of fracture, dislocation, or joint effusion.
There is no evidence of arthropathy or other focal bone abnormality.
Diffuse soft tissue swelling. Extensive arterial vascular
calcifications.
IMPRESSION: Soft tissue swelling. No bone abnormality. Extensive arterial
vascular calcifications.

## 2022-05-30 IMAGING — DX DG FOOT COMPLETE 3+V*L*
3 series · 3 of 3 positions shown · non-contrast
Comparison: None

CLINICAL DATA: Foot and ankle swelling.

EXAM:
LEFT FOOT - COMPLETE 3+ VIEW

[foot ap]
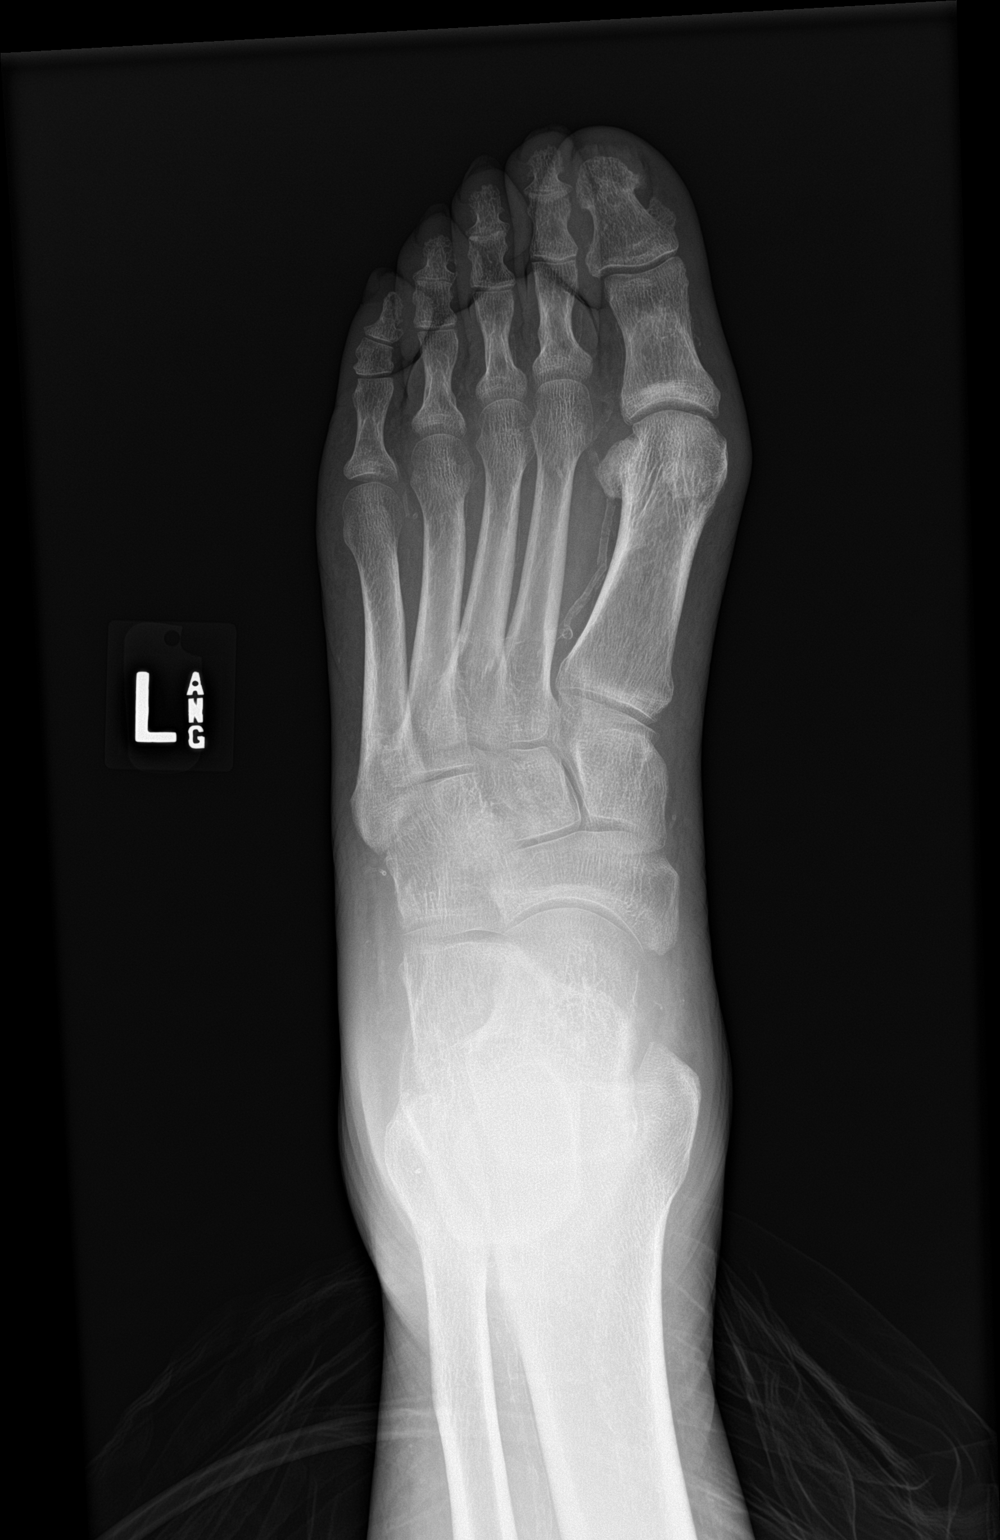

[foot obl]
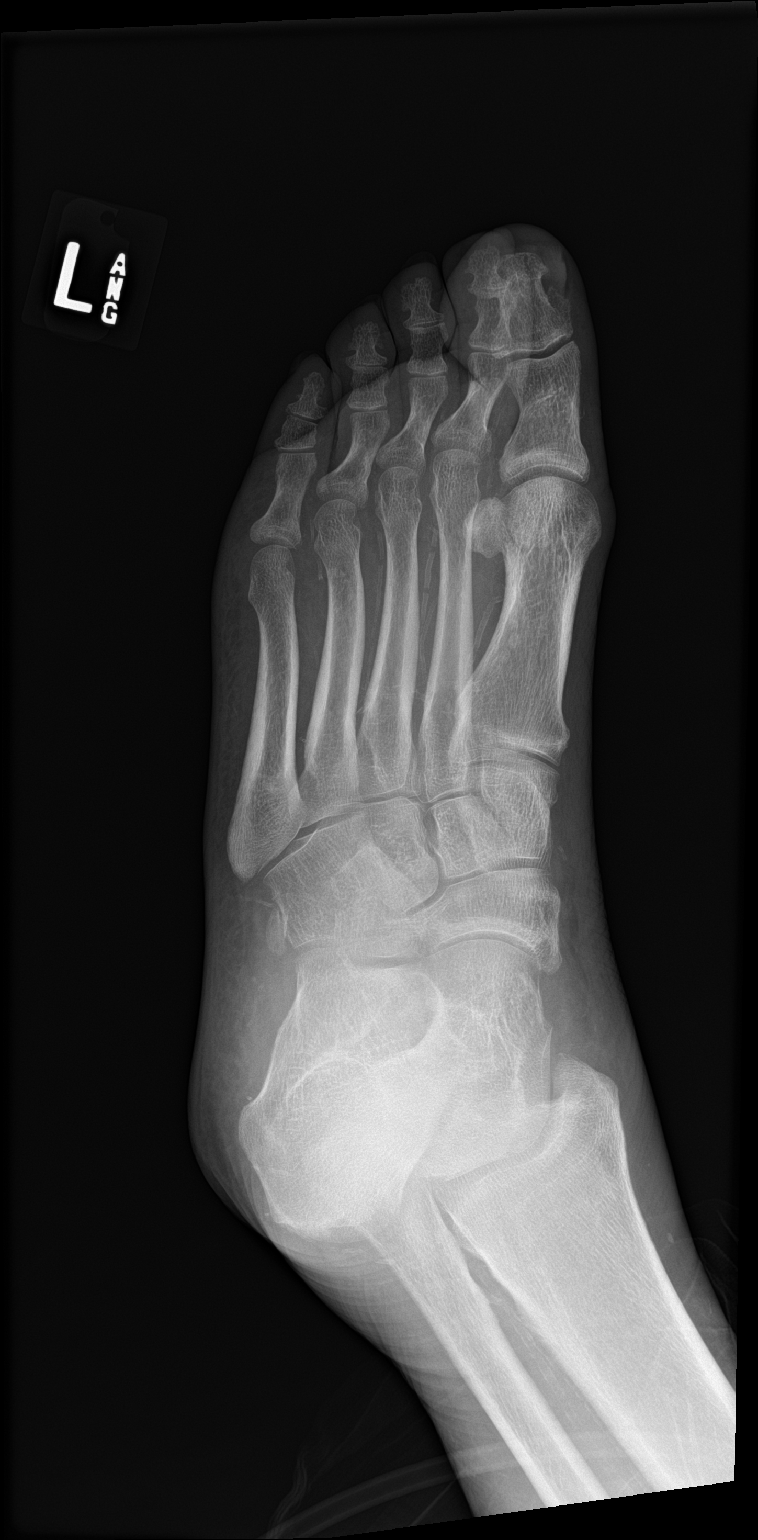

[foot lat]
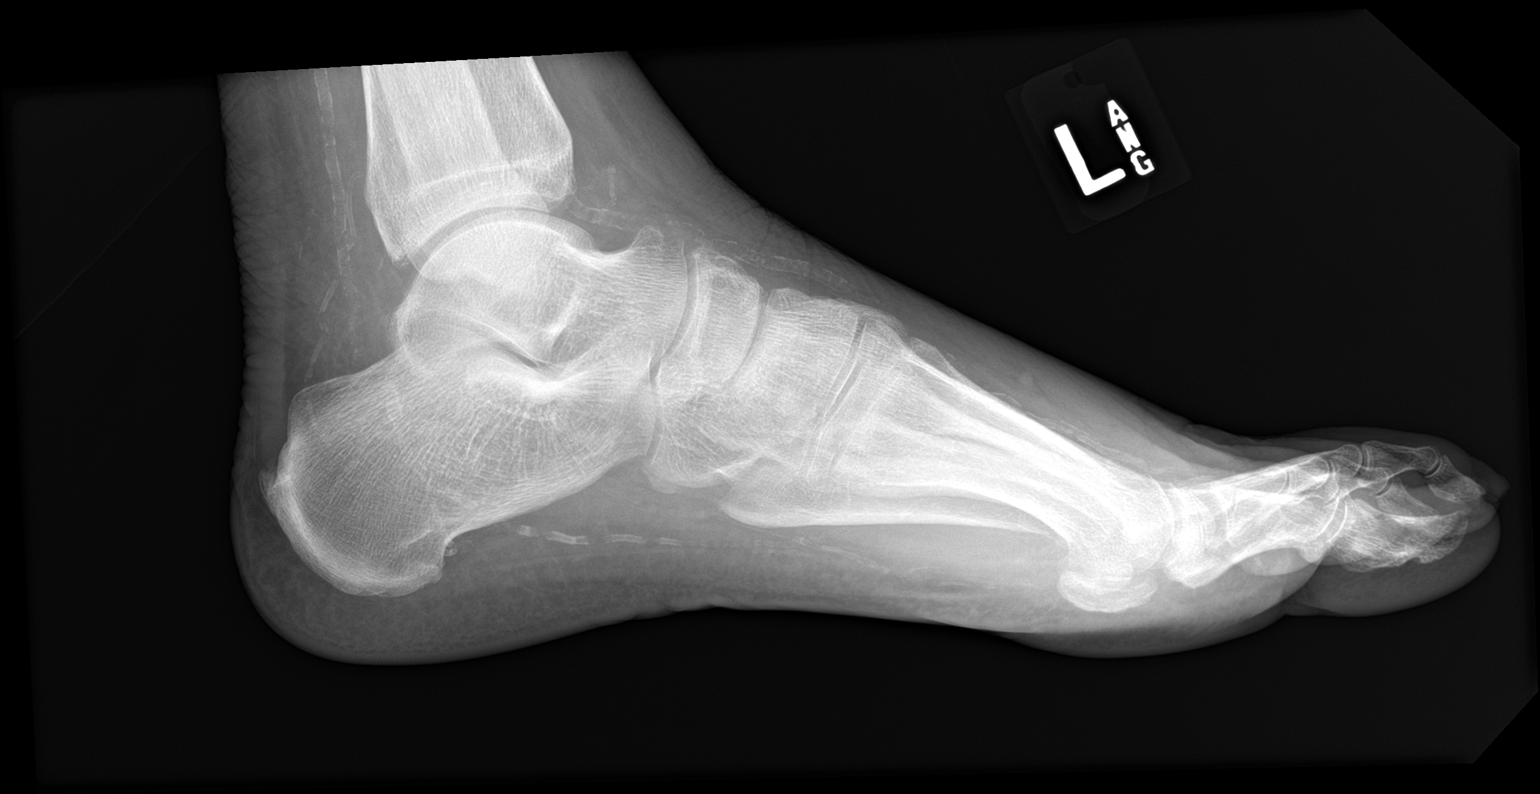

[3 of 3 positions shown; findings below may reference images not displayed]

FINDINGS: Minimal arthritis at the first MTP joint. Slight dorsal spurring at
the talonavicular joint. Bones and joints of the remainder of the
foot appear normal. Extensive arterial vascular calcifications.
Diffuse slight soft tissue swelling.
IMPRESSION: 1. Slight arthritis of the first MTP joint.
2. Diffuse slight soft tissue swelling.

## 2022-06-12 IMAGING — DX DG CHEST 2V
3 series · 3 of 3 positions shown · non-contrast
Comparison: 01/01/2020

CLINICAL DATA: Productive cough, dialysis

EXAM:
CHEST - 2 VIEW

[chest ap (1 of 2)]
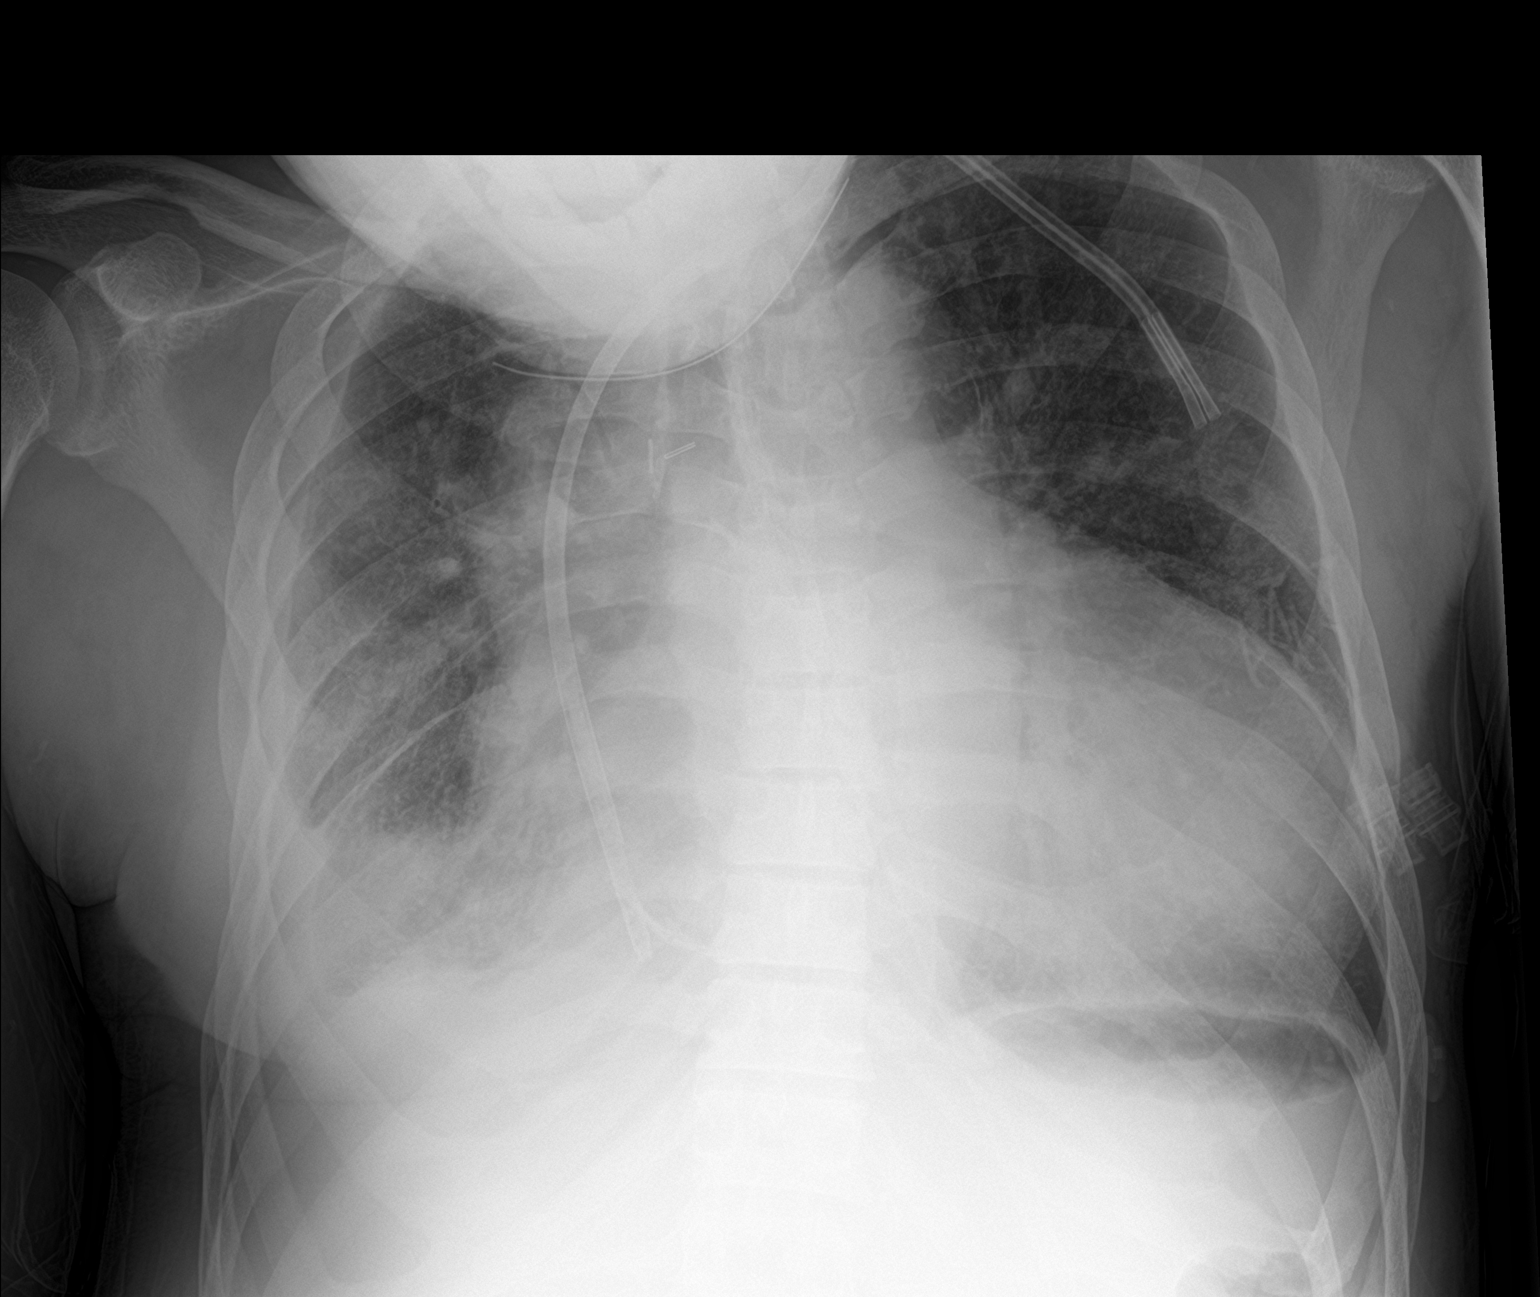

[chest ap (2 of 2)]
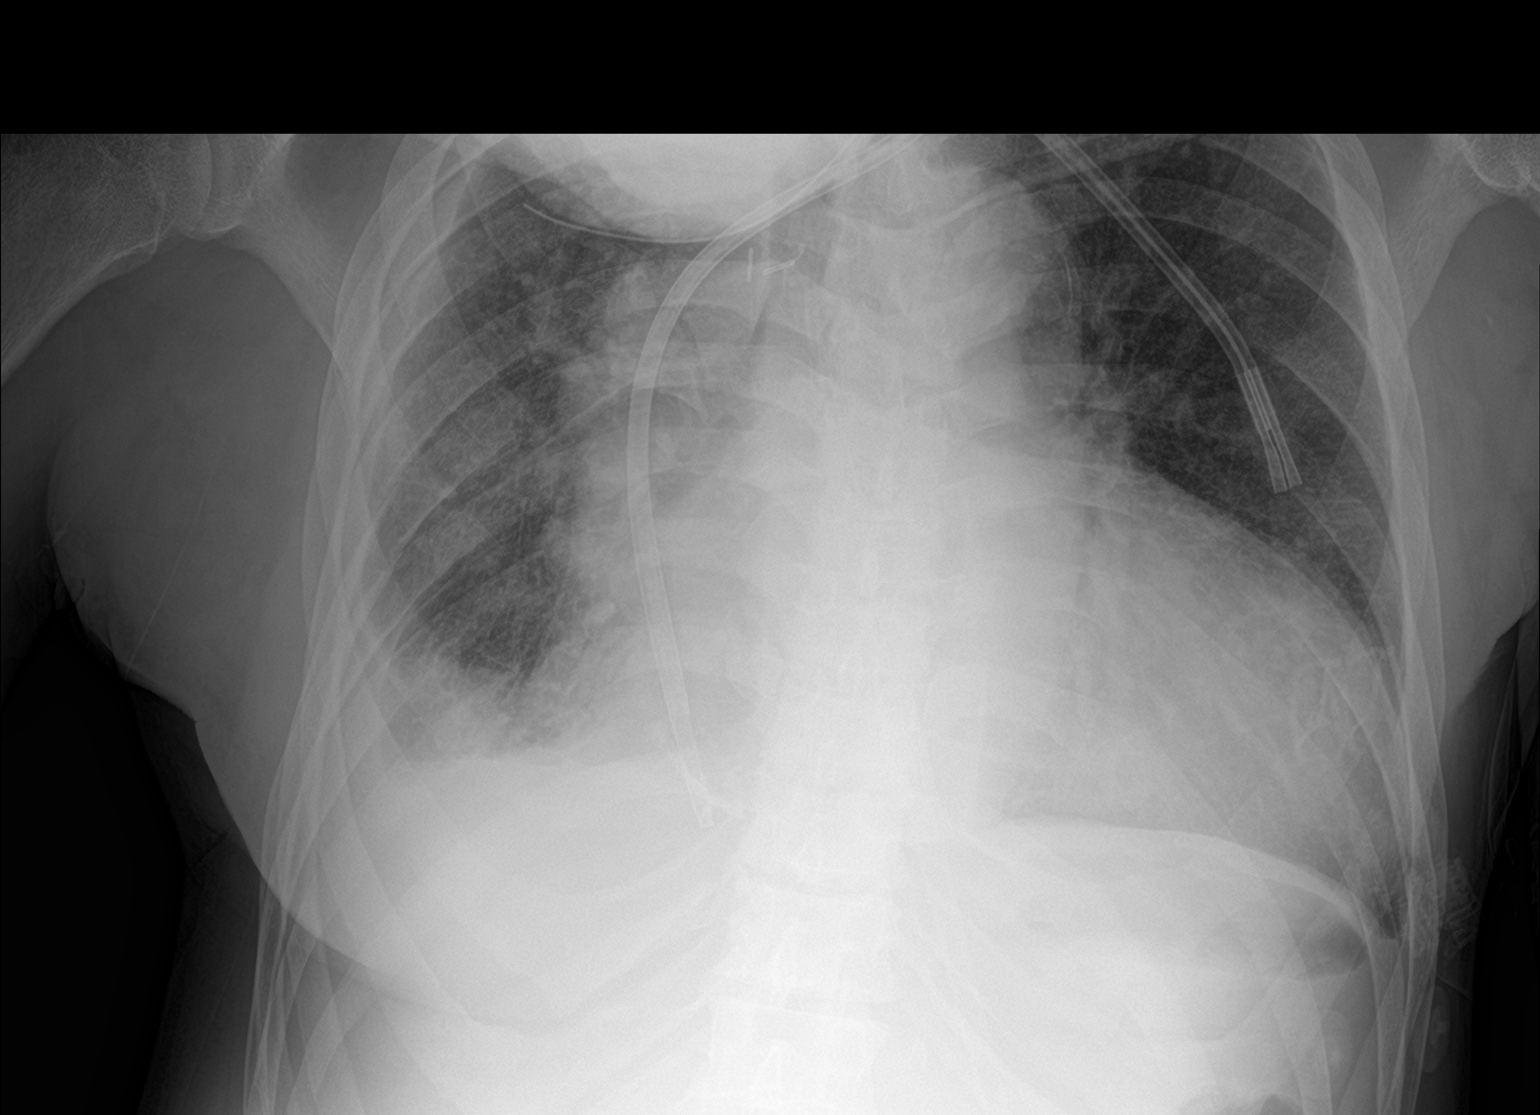

[chest lat]
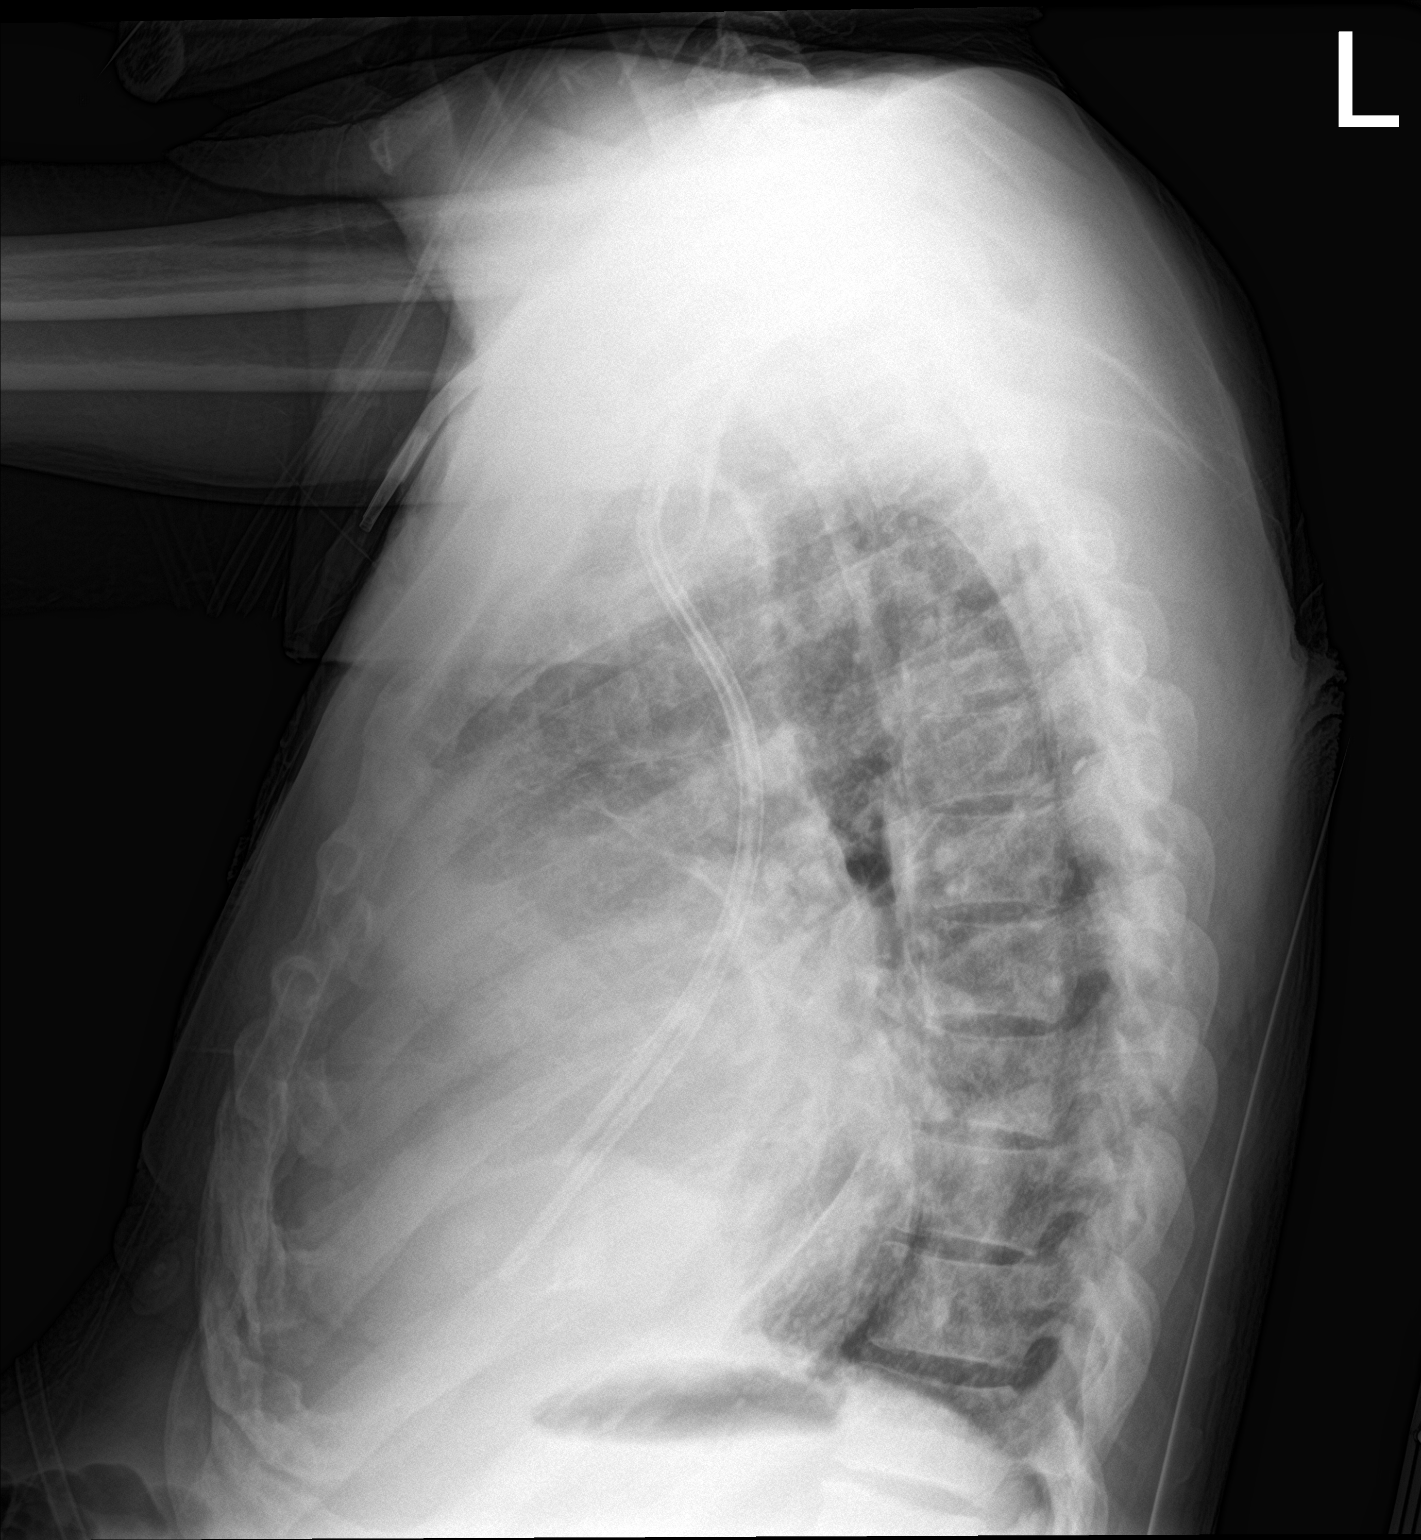

[3 of 3 positions shown; findings below may reference images not displayed]

FINDINGS: Gross cardiomegaly with large bore left neck multi lumen vascular
catheter. Small right, trace left pleural effusions and diffuse
interstitial pulmonary opacity.
IMPRESSION: Small right, trace left pleural effusions and diffuse interstitial
pulmonary opacity, likely edema in the setting of cardiomegaly.
Findings are not significantly changed compared to prior radiograph
dated 01/01/2020.

## 2022-06-20 IMAGING — CT CT ABD-PELV W/O CM
2 of 4 series · 16 of 46 positions shown, 18 images · non-contrast
Comparison: Noncontrast CT on 12/30/2019

CLINICAL DATA: Diffuse abdominal pain and distension, greatest in
the left upper quadrant.

EXAM:
CT ABDOMEN AND PELVIS WITHOUT CONTRAST
TECHNIQUE: Multidetector CT imaging of the abdomen and pelvis was performed
following the standard protocol without IV contrast.

[Series 3: ap without · axial · non-contrast · 0.71mm/px · z∈[+700,+1124]mm · 13 of 97 slices shown, 15 images]
[im 6/97  soft-tissue]
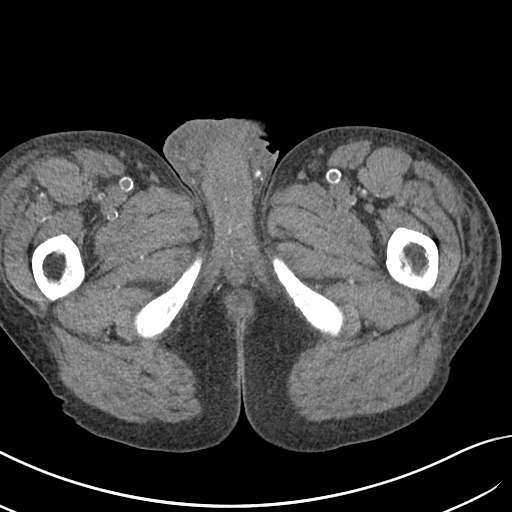
[im 6/97  bone]
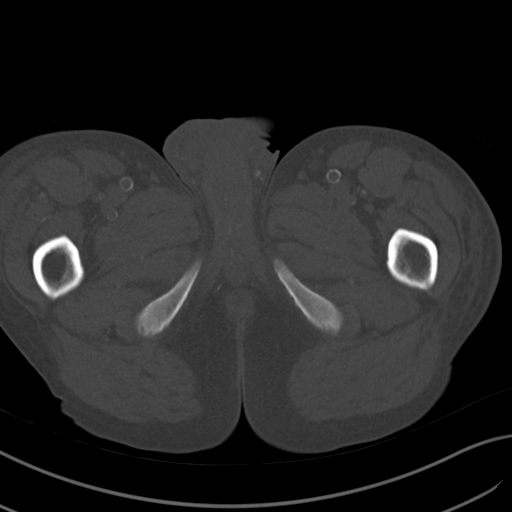
[im 12/97  soft-tissue]
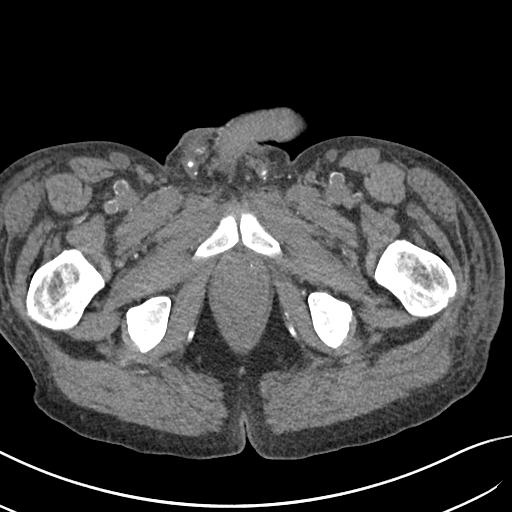
[im 23/97  soft-tissue]
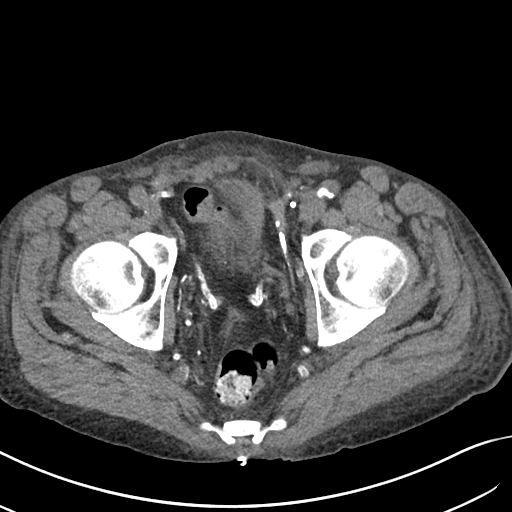
[im 29/97  soft-tissue]
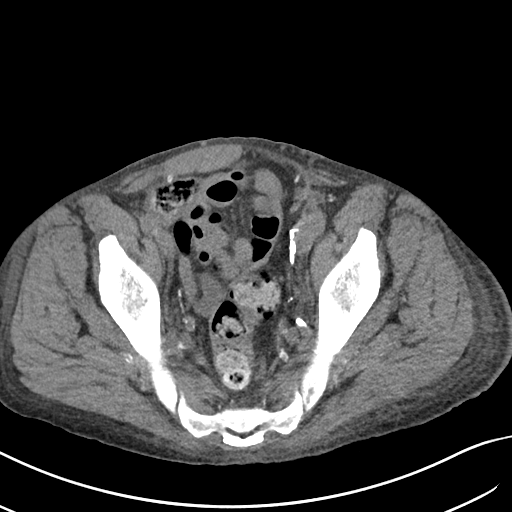
[im 34/97  soft-tissue]
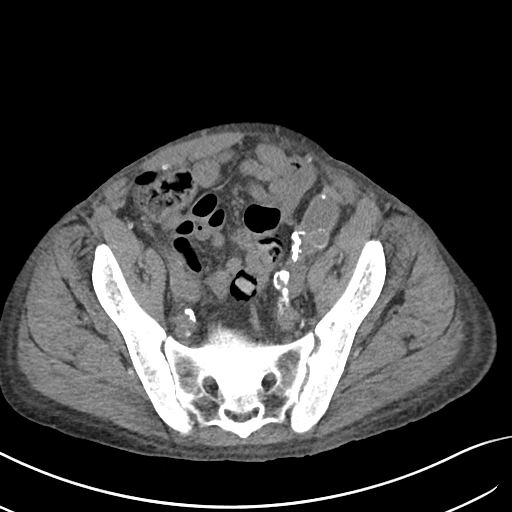
[im 40/97  soft-tissue]
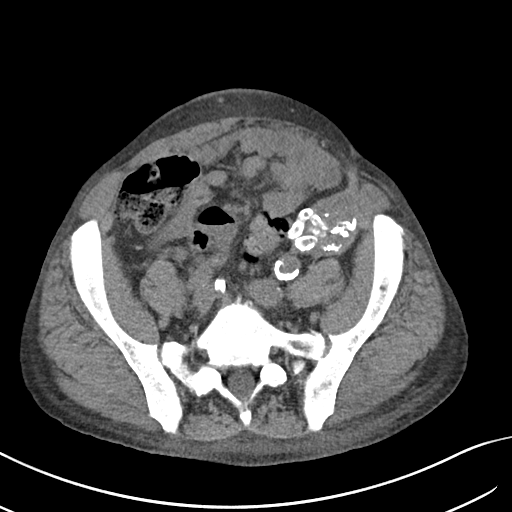
[im 51/97  soft-tissue]
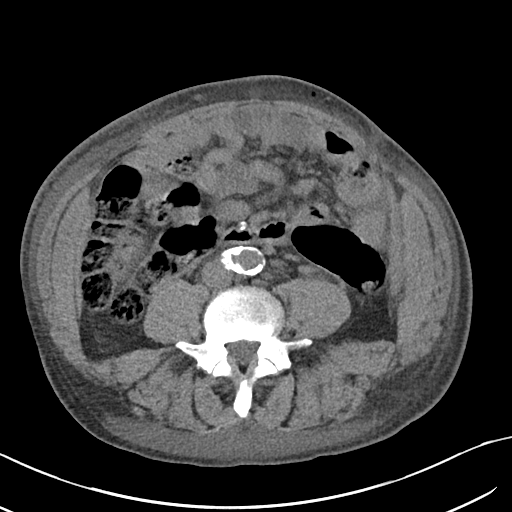
[im 57/97  soft-tissue]
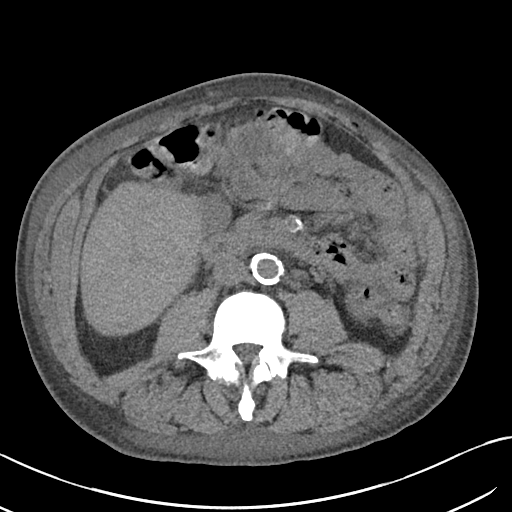
[im 63/97  soft-tissue]
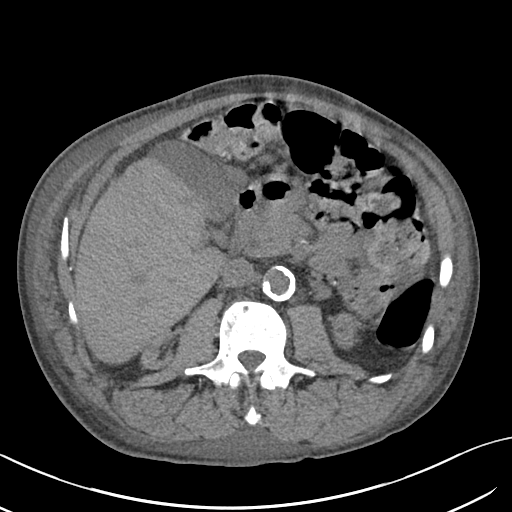
[im 63/97  bone]
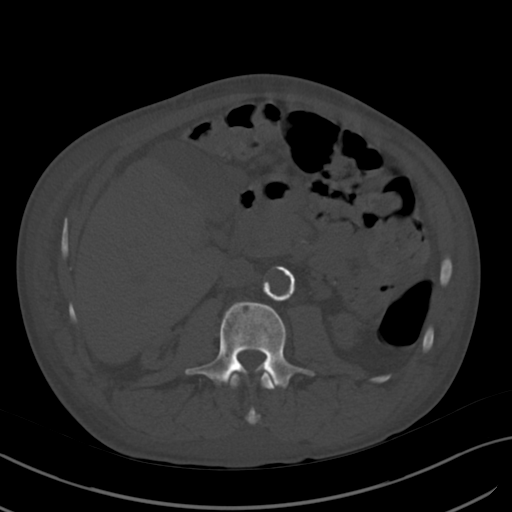
[im 68/97  soft-tissue]
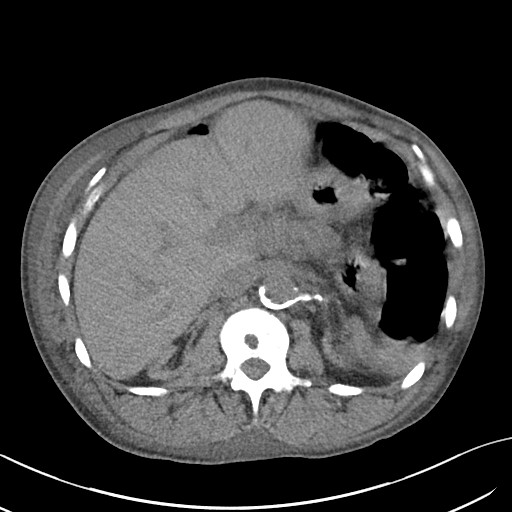
[im 74/97  soft-tissue]
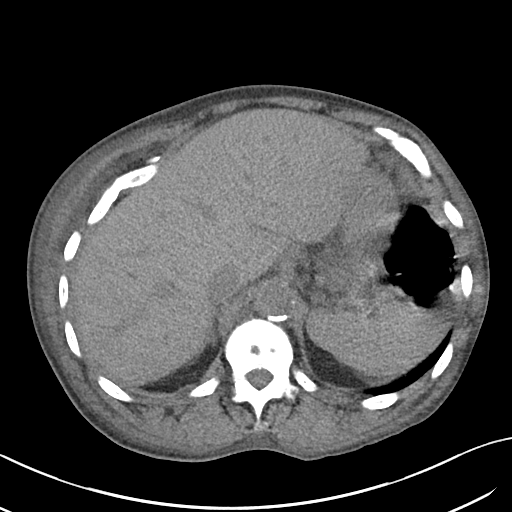
[im 85/97  soft-tissue]
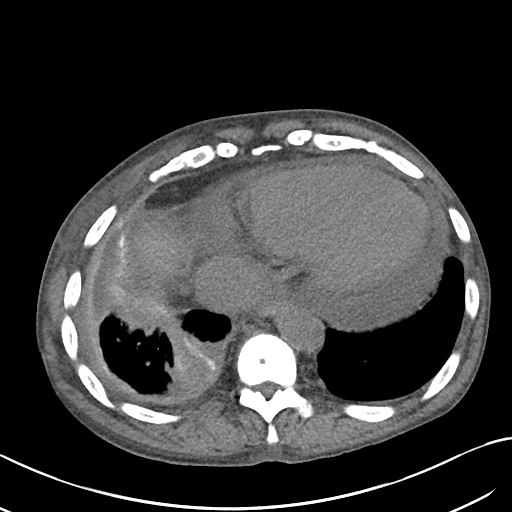
[im 91/97  soft-tissue]
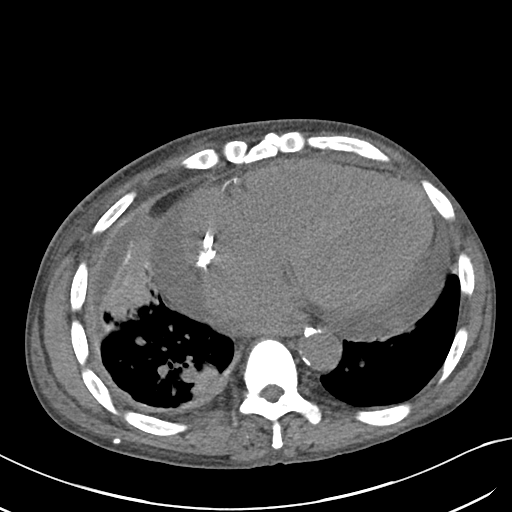

[Series 6: cor · coronal · 0.77mm/px · 3 of 95 slices shown]
[im 32/95  soft-tissue]
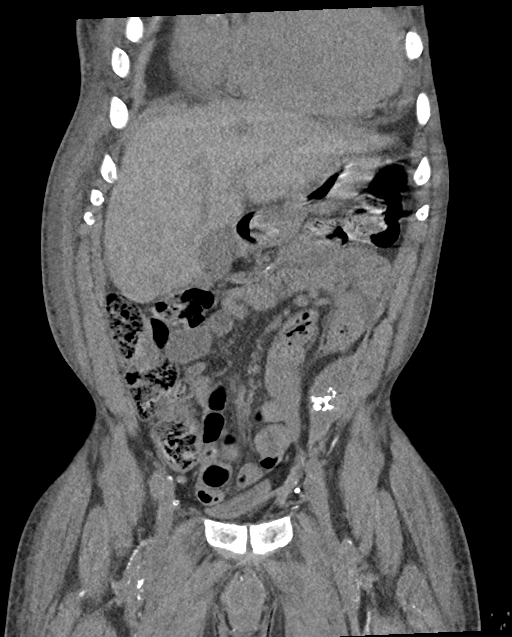
[im 42/95  soft-tissue]
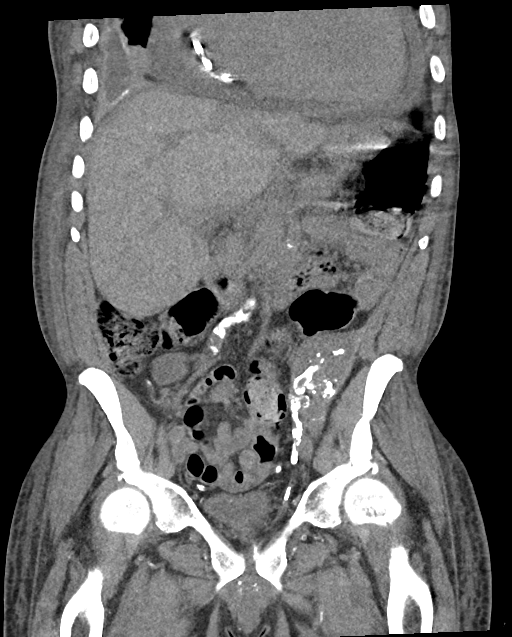
[im 53/95  soft-tissue]
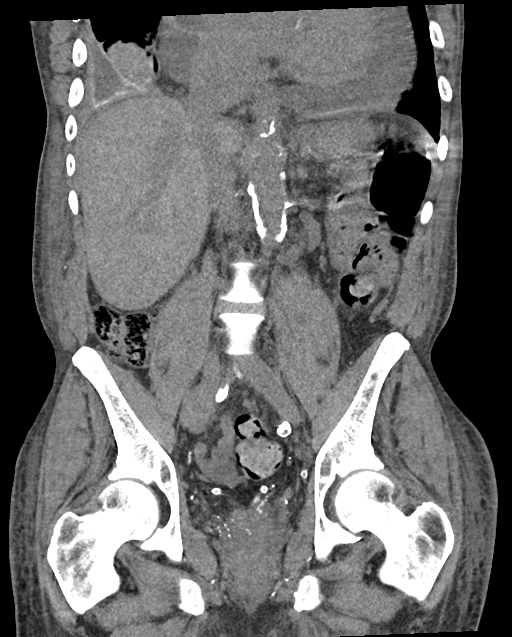

[16 of 46 positions shown; findings below may reference images not displayed]

FINDINGS: Lower chest: Stable cardiomegaly moderate pericardial effusion.
Stable right basilar pleural-parenchymal scarring, rounded
atelectasis, and small loculated pleural effusion. Radiodensity in
the right pleural space may be due to previous talc pleurodesis.

Hepatobiliary: No mass visualized on this unenhanced exam.
Gallbladder is unremarkable. No evidence of biliary ductal
dilatation.

Pancreas: No mass or inflammatory process visualized on this
unenhanced exam.

Spleen:  Within normal limits in size.

Adrenals/Urinary tract: Diffuse bilateral renal parenchymal atrophy
again seen. A 2.4 cm intermediate attenuation exophytic lesion
arising from the midpole of the left kidney shows no significant
change compared to previous exams dating back to [id number]. This may
represent a hemorrhagic cyst although a solid renal mass cannot
definitely be excluded. No evidence of hydronephrosis.

Dystrophic partially calcified renal transplant in left iliac fossa
remains stable since previous study. Urinary bladder is nearly
empty.

Stomach/Bowel: No evidence of obstruction, inflammatory process, or
abnormal fluid collections.

Vascular/Lymphatic: No pathologically enlarged lymph nodes
identified. No evidence of abdominal aortic aneurysm. Aortic
atherosclerosis noted.

Reproductive:  No mass or other significant abnormality.

Other:  None

Musculoskeletal: No suspicious bone lesions identified. Diffuse
osteosclerosis again seen, consistent with renal osteodystrophy.
IMPRESSION: No acute findings.

Stable 2.4 cm exophytic lesion arising from midpole of left kidney,
which may represent a hemorrhagic cyst although a solid renal mass
cannot definitely be excluded. Consider abdomen MRI for further
characterization.

Stable cardiomegaly and moderate pericardial effusion.

Stable right basilar pleural-parenchymal scarring, rounded
atelectasis, and small loculated right pleural effusion.

Aortic Atherosclerosis ([id number].X).

## 2022-06-30 IMAGING — DX DG CHEST 1V PORT
1 series · 1 of 1 positions shown · non-contrast
Comparison: 01/22/2020

CLINICAL DATA: Chest pain and shortness of breath.  On dialysis.

EXAM:
PORTABLE CHEST 1 VIEW

[chest ap]
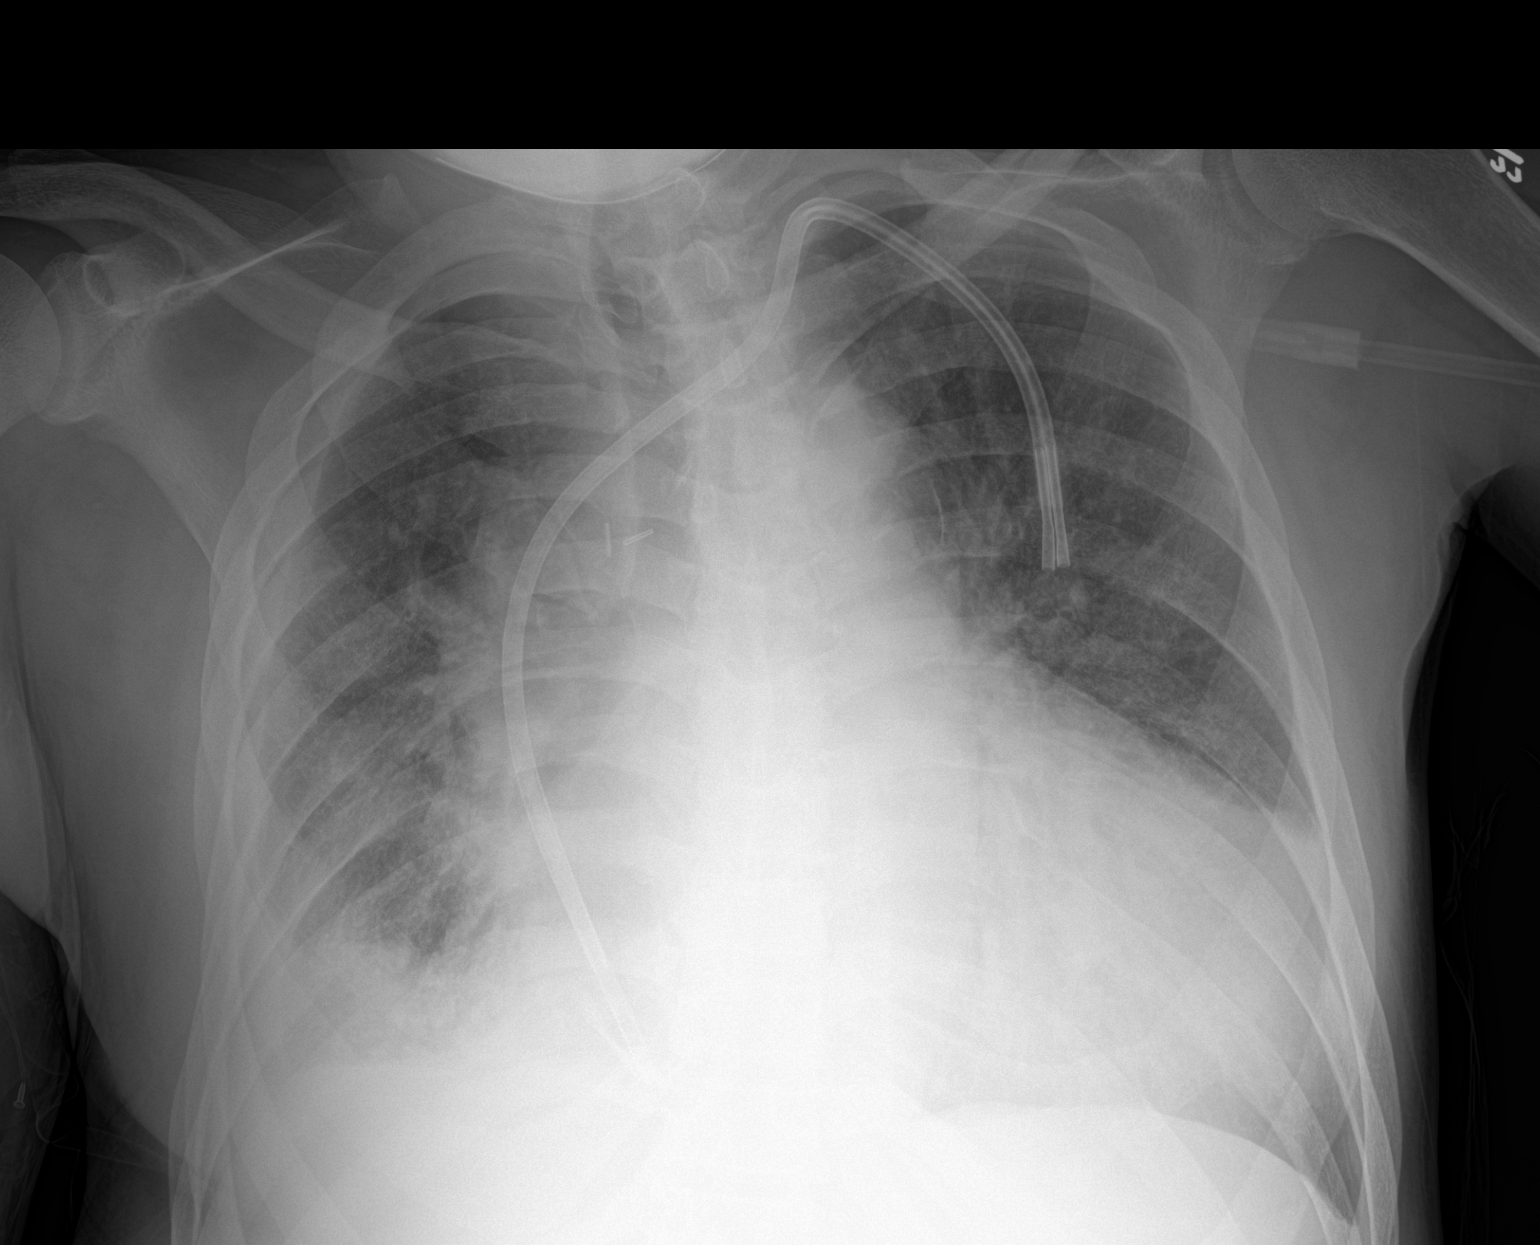

[1 of 1 positions shown; findings below may reference images not displayed]

FINDINGS: A left jugular dialysis catheter remains in place terminating over
the right atrium. The cardiac silhouette remains prominently
enlarged. Bilateral interstitial opacities are similar to the prior
study as is a small right pleural effusion. Asymmetric right basilar
parenchymal opacity is also unchanged and likely reflects
atelectasis. No pneumothorax is identified. No acute osseous
abnormality is seen.
IMPRESSION: Unchanged cardiomegaly, small right pleural effusion, and bilateral
interstitial opacities likely reflecting edema.

## 2022-07-09 IMAGING — CR DG CHEST 2V
2 series · 2 of 2 positions shown · non-contrast
Comparison: February 09, 2020

CLINICAL DATA: Chest pain

EXAM:
CHEST - 2 VIEW

[w chest pa]
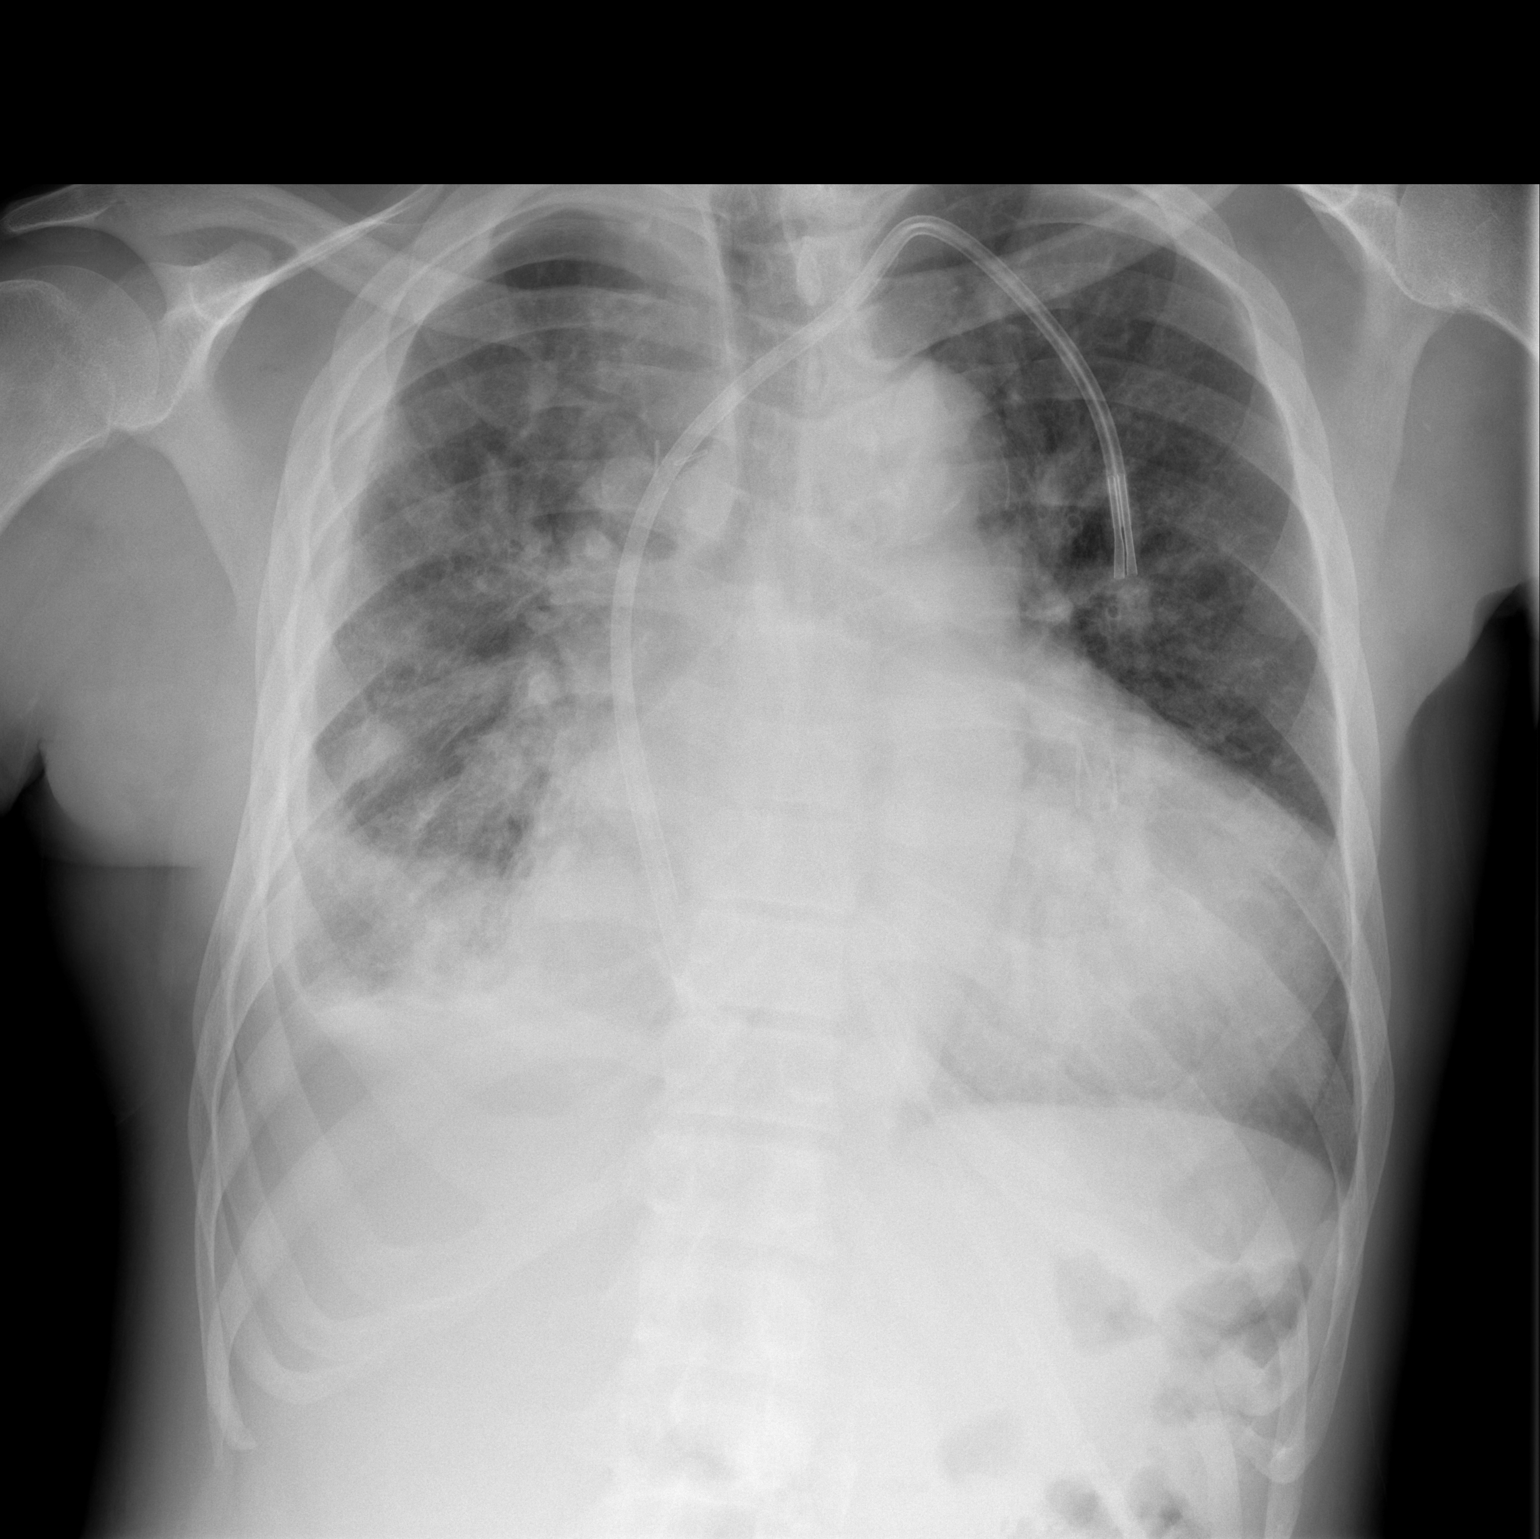

[w chest lat]
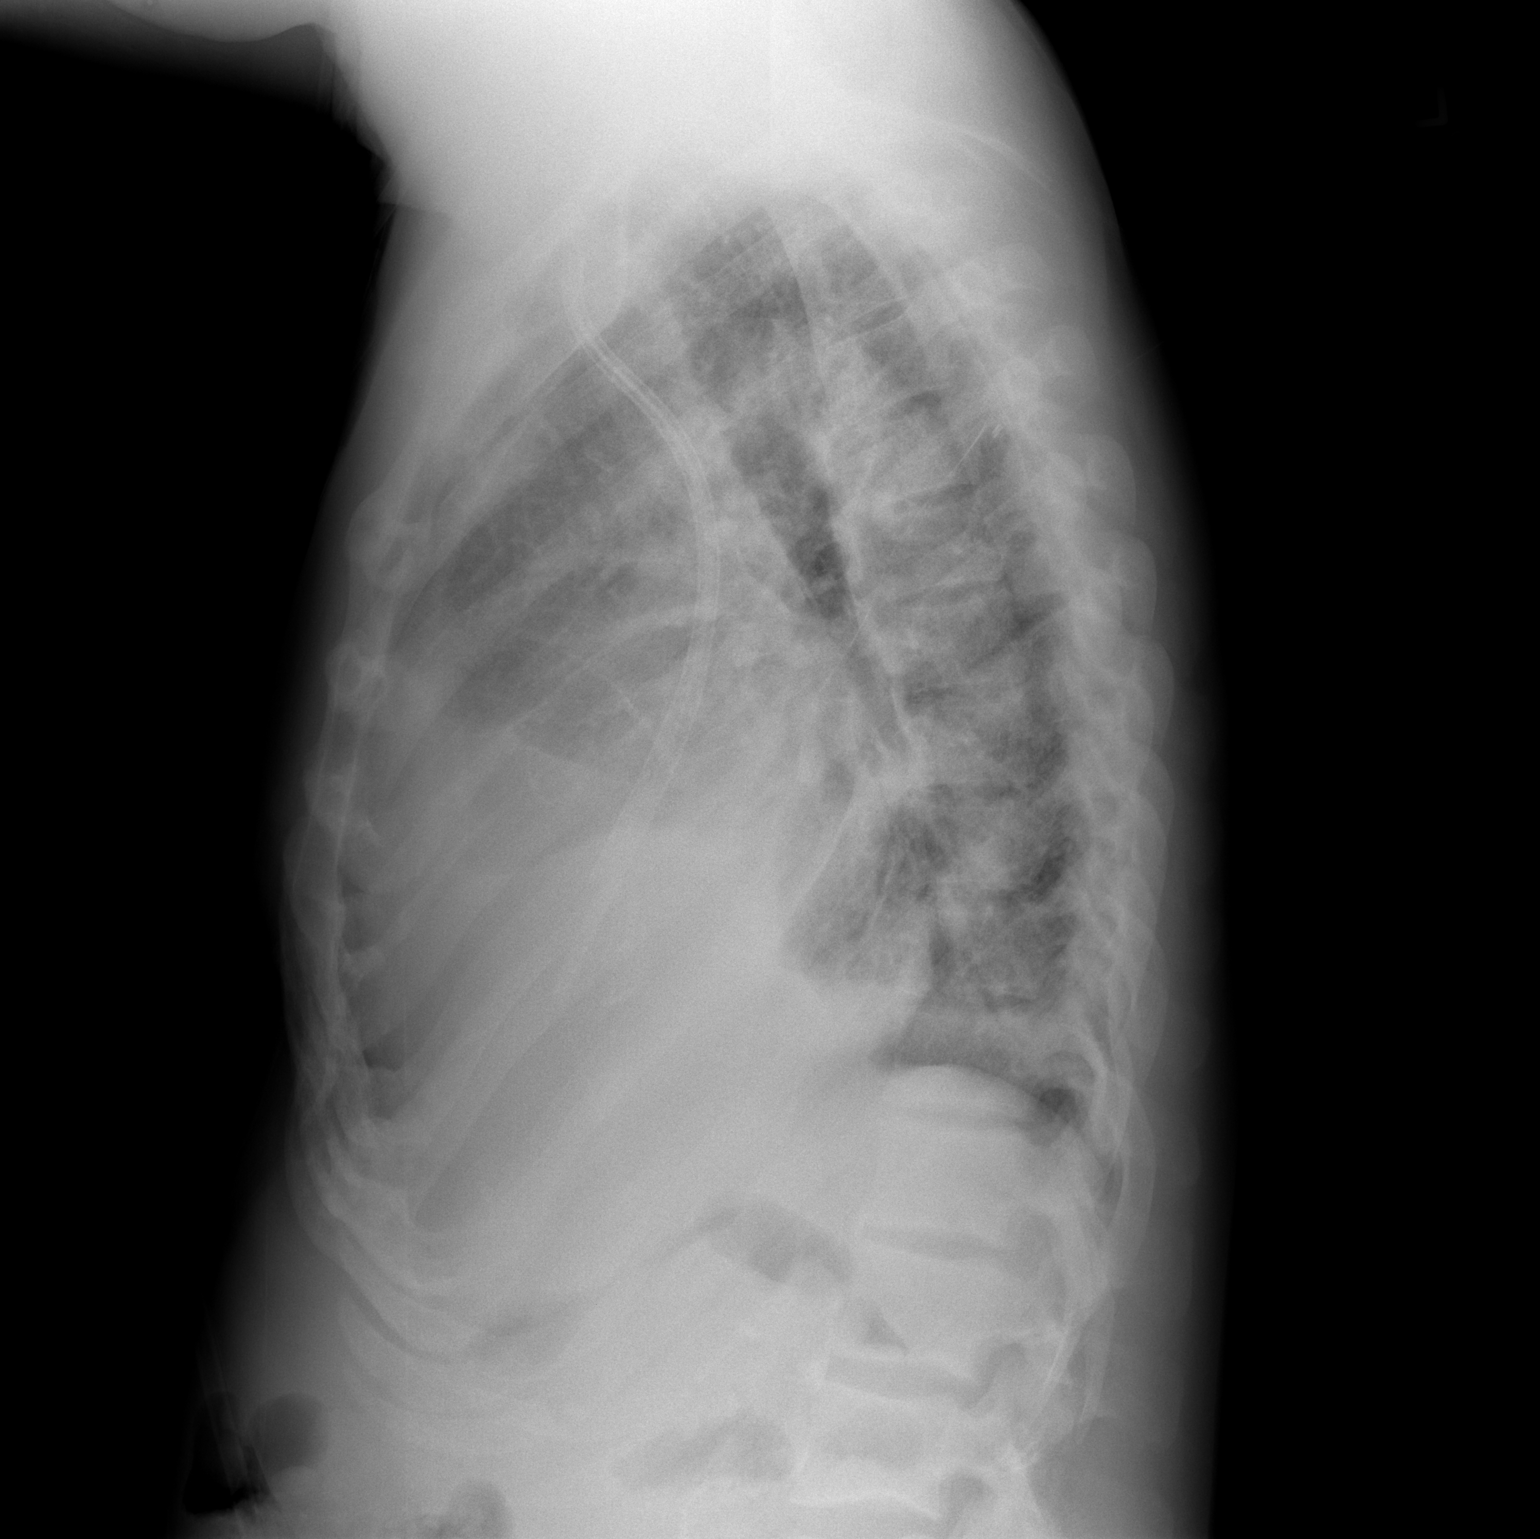

[2 of 2 positions shown; findings below may reference images not displayed]

FINDINGS: Central catheter tip is in the right atrium. No pneumothorax. There
is a right pleural effusion with areas of airspace opacity in the
right mid and lower lung regions. Left lung is clear. There is
cardiomegaly with pulmonary venous hypertension. There is no
adenopathy. There is aortic atherosclerosis. Areas of relative bony
sclerosis likely due to renal osteodystrophy.
IMPRESSION: Right pleural effusion with patchy airspace opacity in the right mid
and lower lung zones, likely representing foci of pneumonia. Lungs
elsewhere clear.

There is cardiomegaly with pulmonary vascular congestion.

Central catheter tip in right atrium.  No pneumothorax.

Bony changes suggesting renal osteodystrophy.

Aortic Atherosclerosis ([id number]).

## 2022-07-13 IMAGING — CR DG CHEST 2V
2 series · 2 of 2 positions shown · non-contrast
Comparison: [DATE] [DATE], [DATE], [DATE] [DATE], [DATE]

CLINICAL DATA: Abdominal pain to RIGHT shoulder

EXAM:
CHEST - 2 VIEW

[w chest pa]
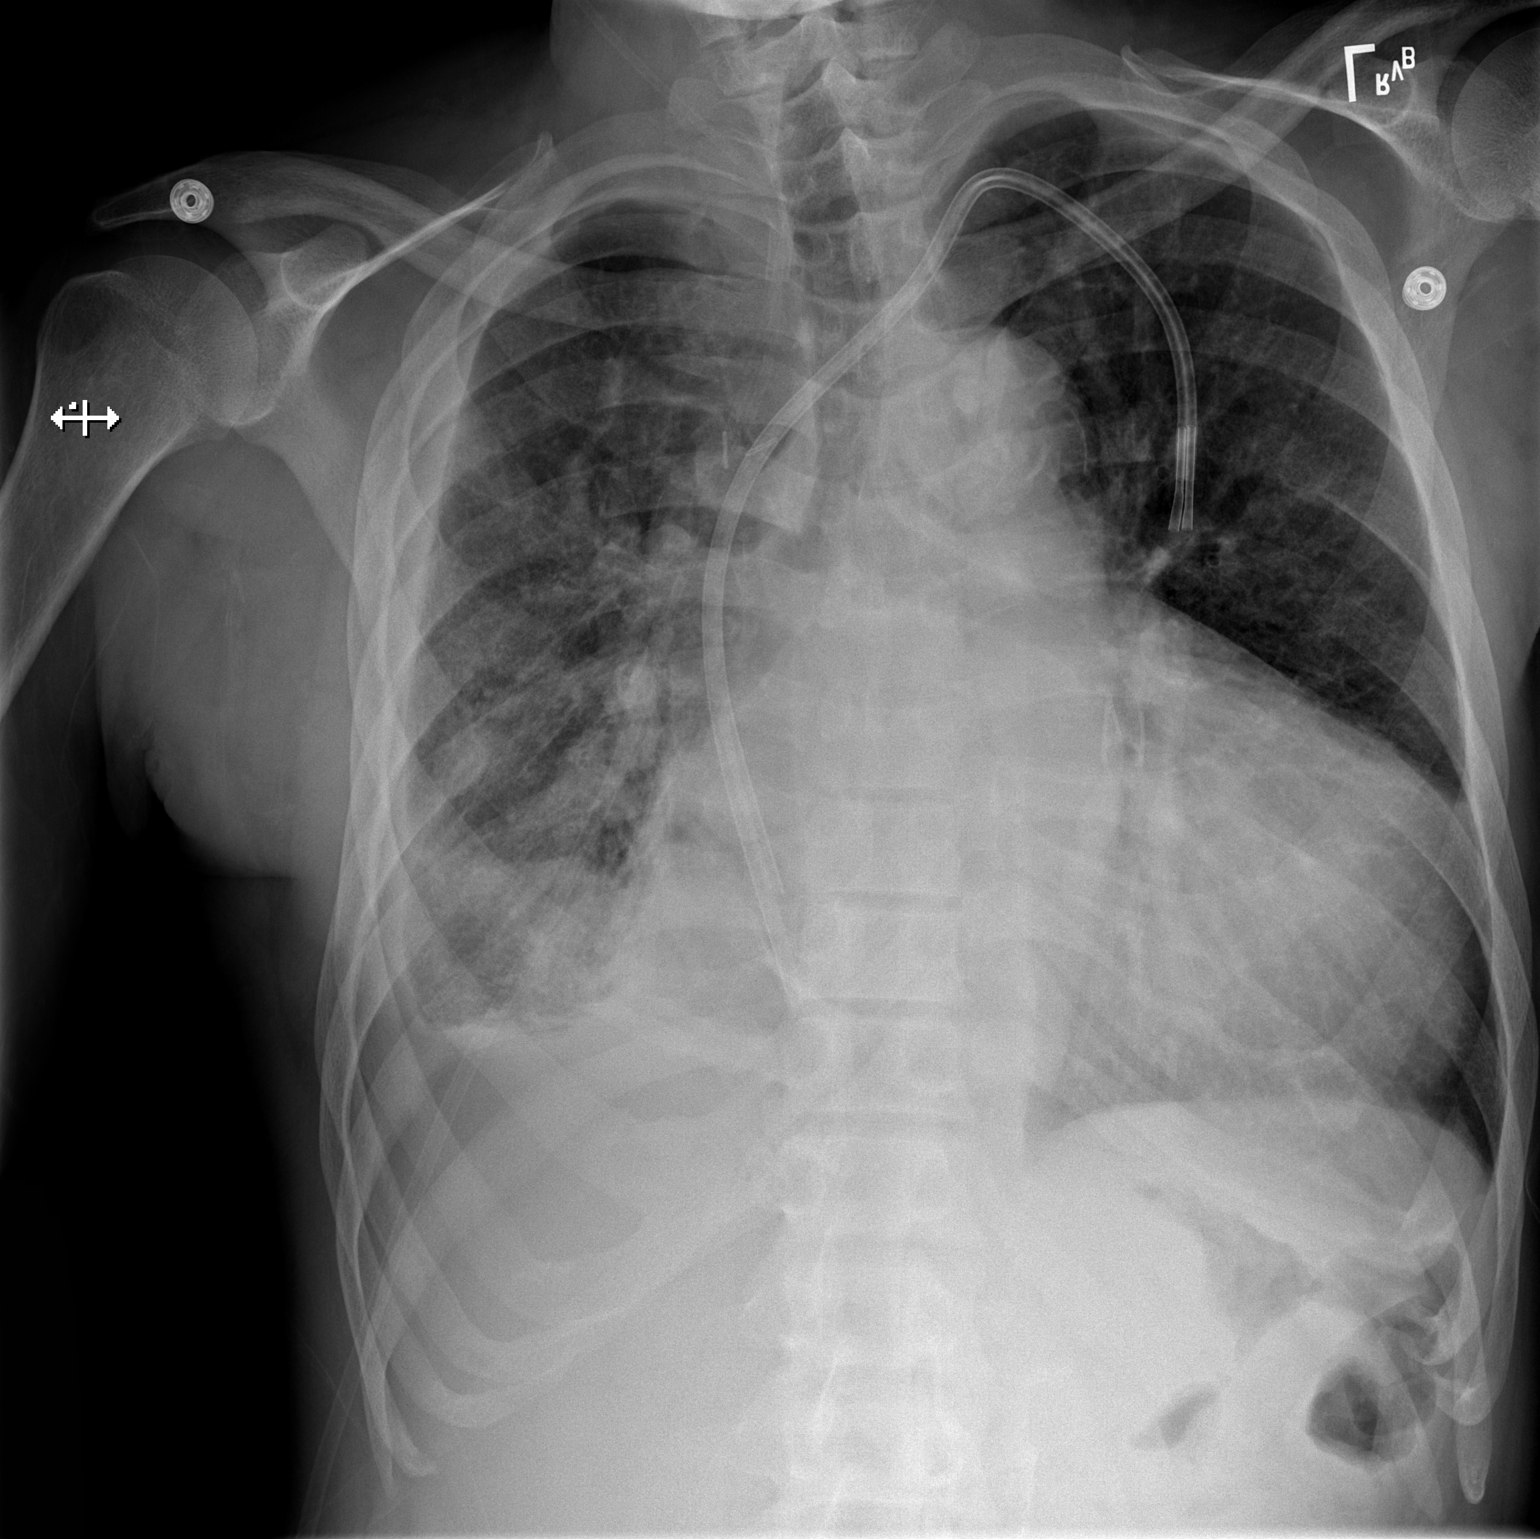

[w chest lat]
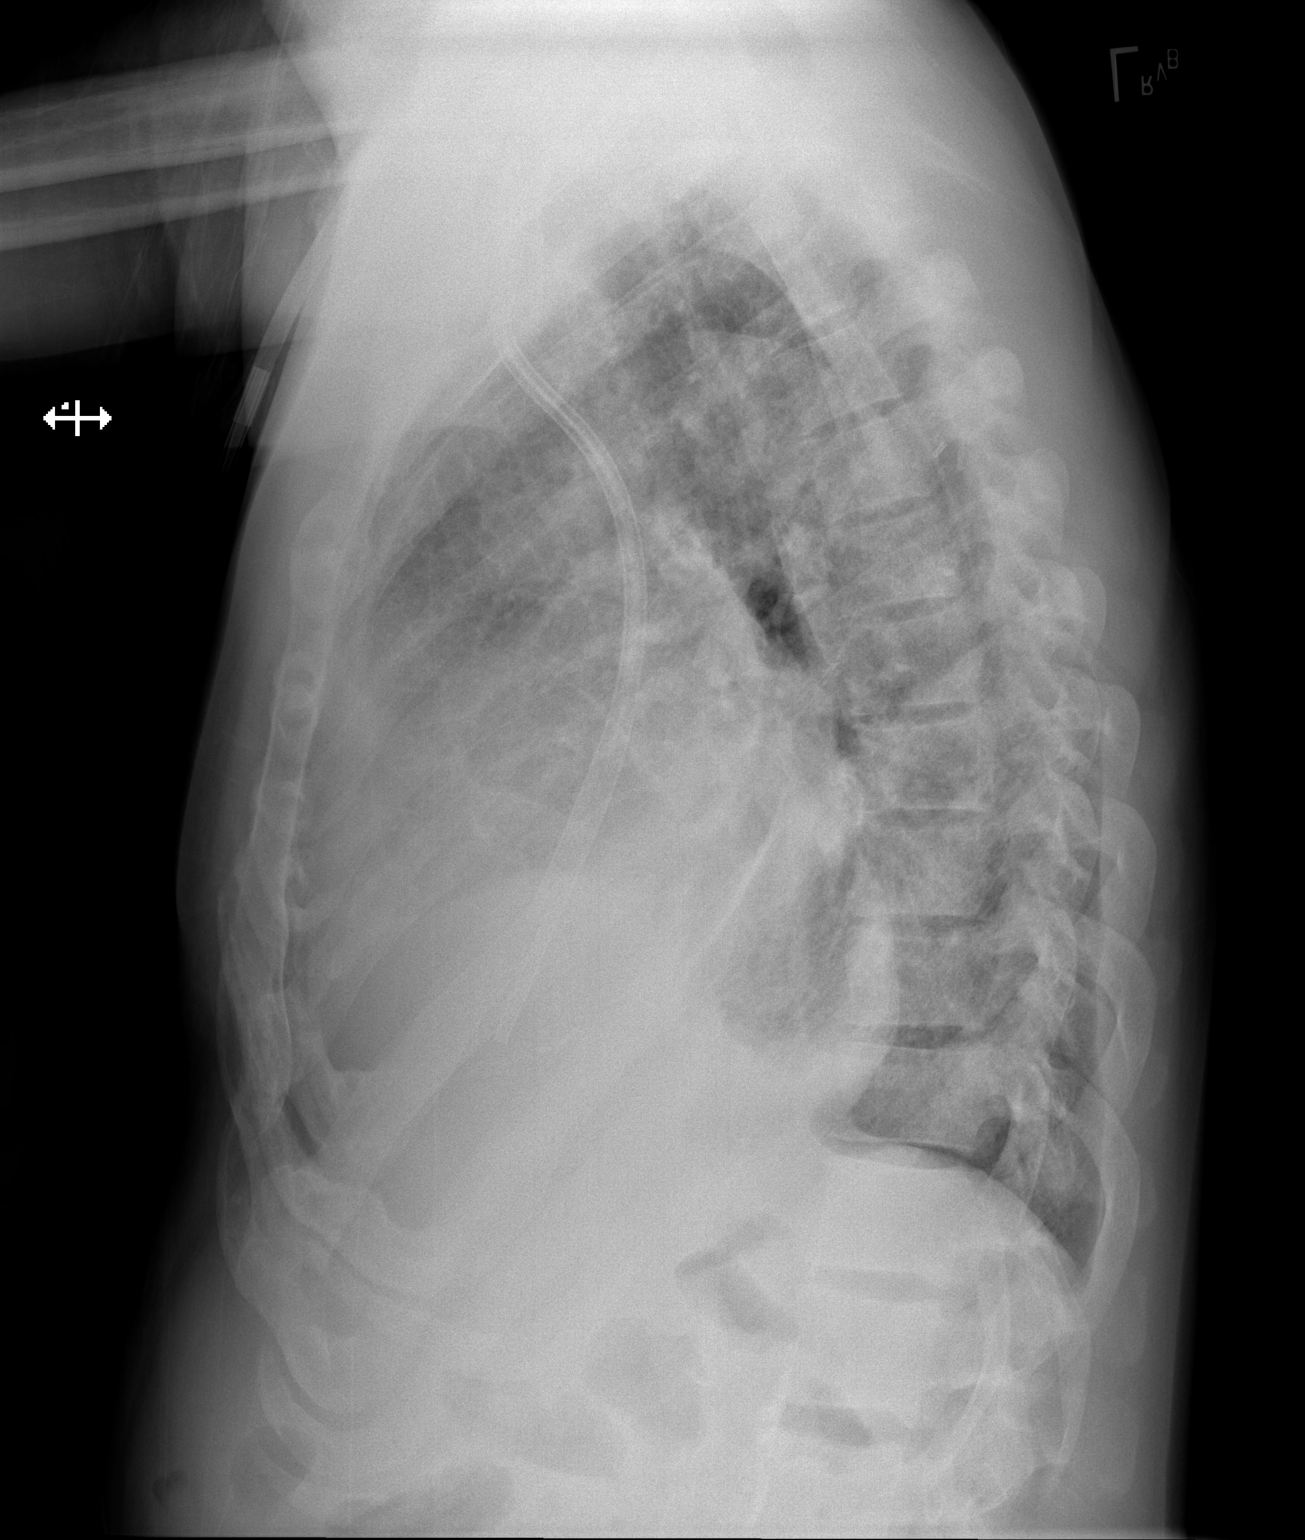

[2 of 2 positions shown; findings below may reference images not displayed]

FINDINGS: The cardiomediastinal silhouette is unchanged and enlarged in
contour.LEFT CVC with tips terminating over the RIGHT atrium.
Pericardial effusion. Small RIGHT and trace LEFT pleural effusion,
similar comparison to prior. No pneumothorax. Atherosclerotic
calcifications of the aorta. Persistent perihilar vascular
prominence with peripheral vascular indistinctness. Asymmetric RIGHT
greater than LEFT interstitial opacities. More focal heterogeneous
airspace opacities throughout the RIGHT lung, unchanged comparison
to prior. Visualized abdomen is unremarkable. Diffusely increased
bone density likely reflecting renal osteodystrophy.
IMPRESSION: 1. Similar small RIGHT and trace LEFT pleural effusions.
2. Unchanged asymmetric RIGHT greater than LEFT interstitial and
airspace opacities, favored to represent pulmonary edema although
atypical infection could have a similar appearance.
3. Pericardial effusion.

## 2022-07-17 IMAGING — DX DG FEMUR 2+V*R*
4 series · 4 of 4 positions shown · non-contrast
Comparison: None.

CLINICAL DATA: Leg pain

EXAM:
RIGHT FEMUR 2 VIEWS

[femur ap (1 of 2)]
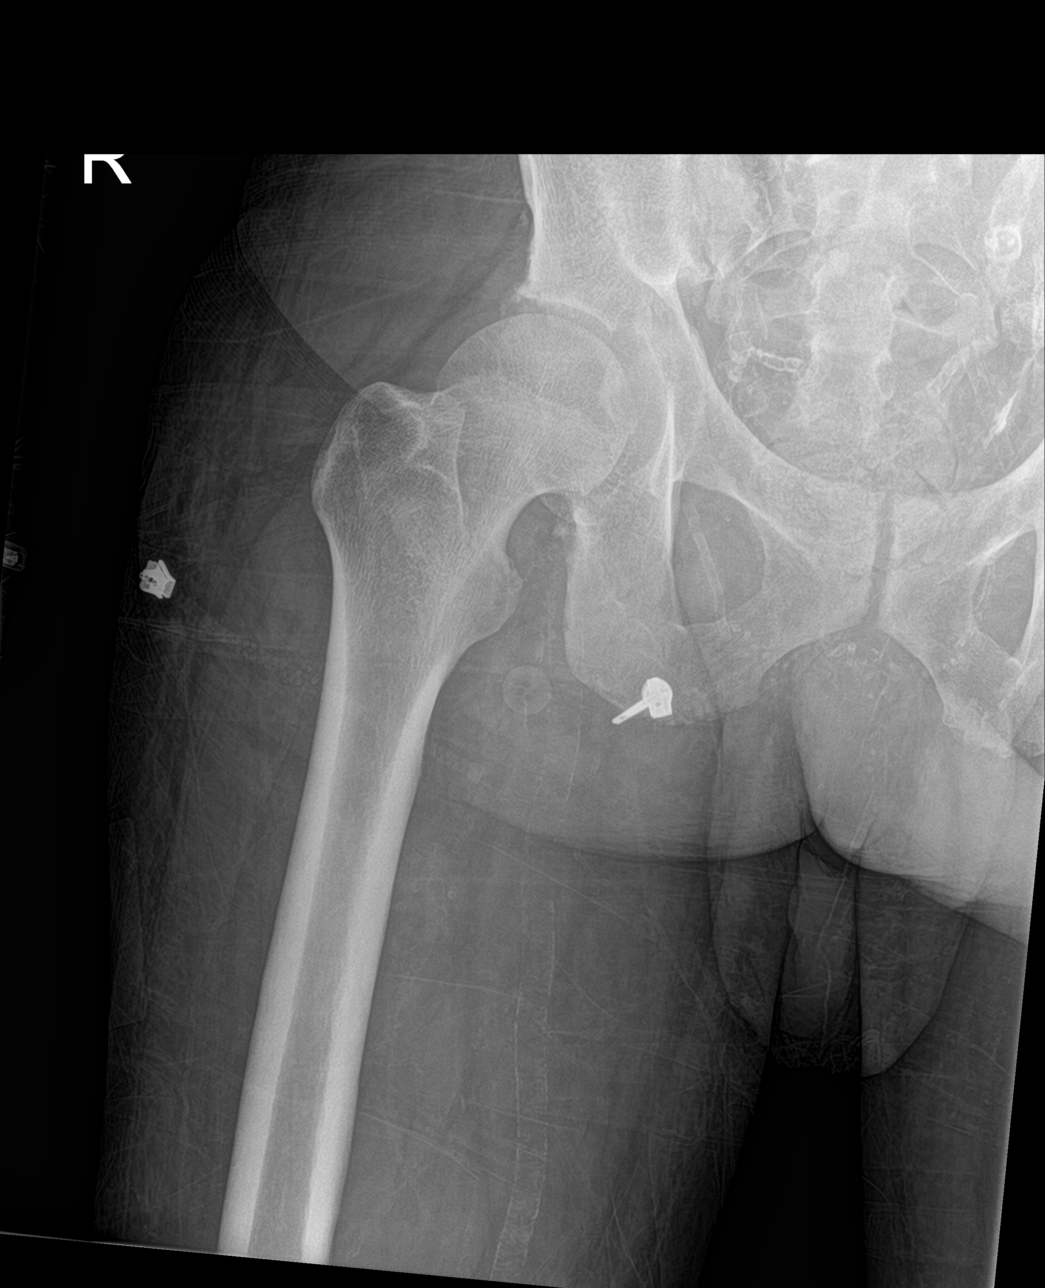

[femur ap (2 of 2)]
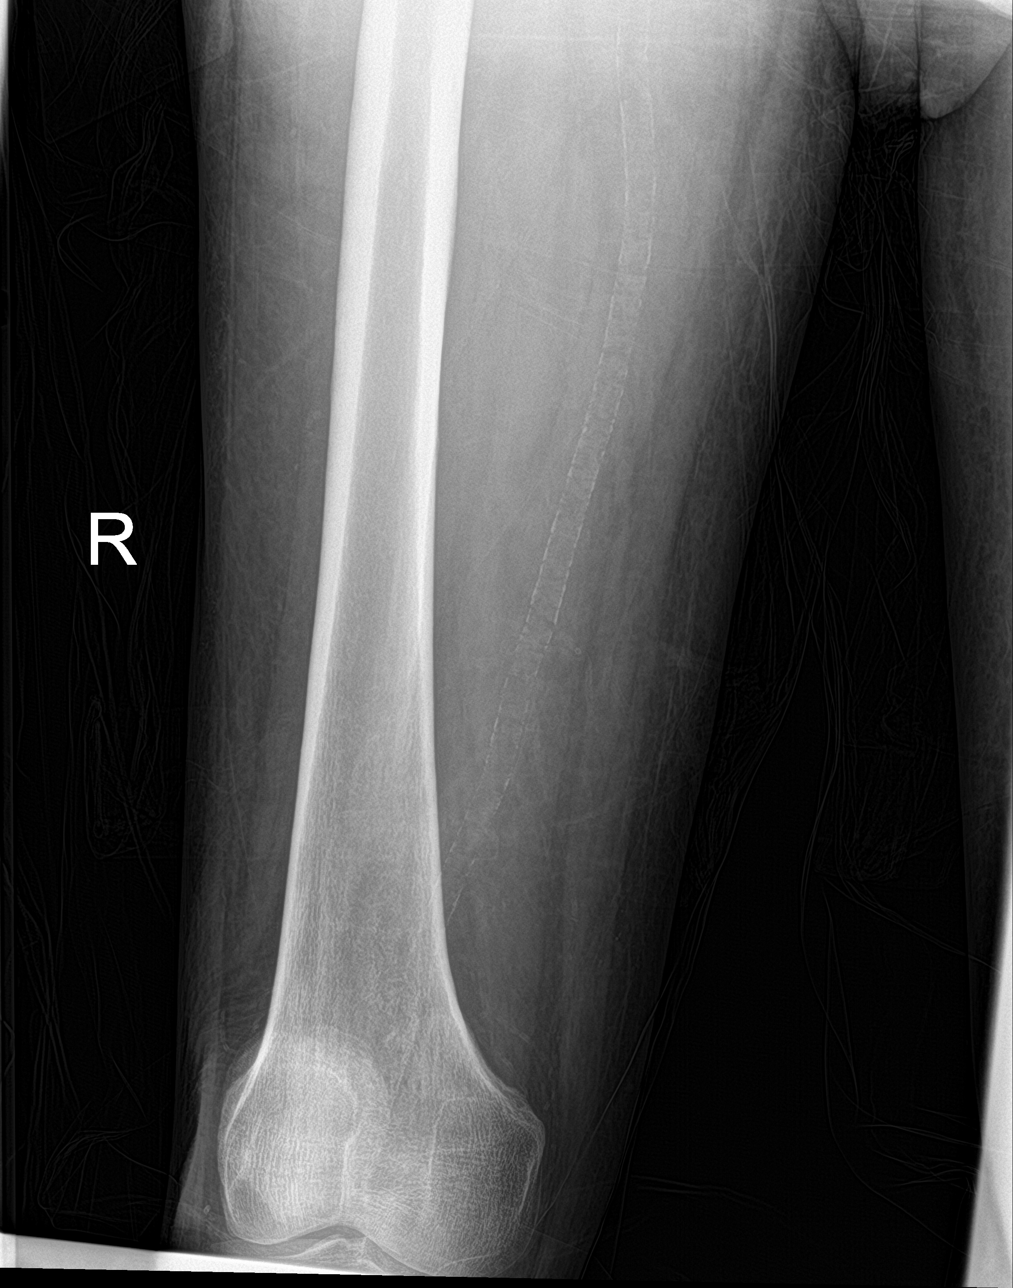

[femur lat (1 of 2)]
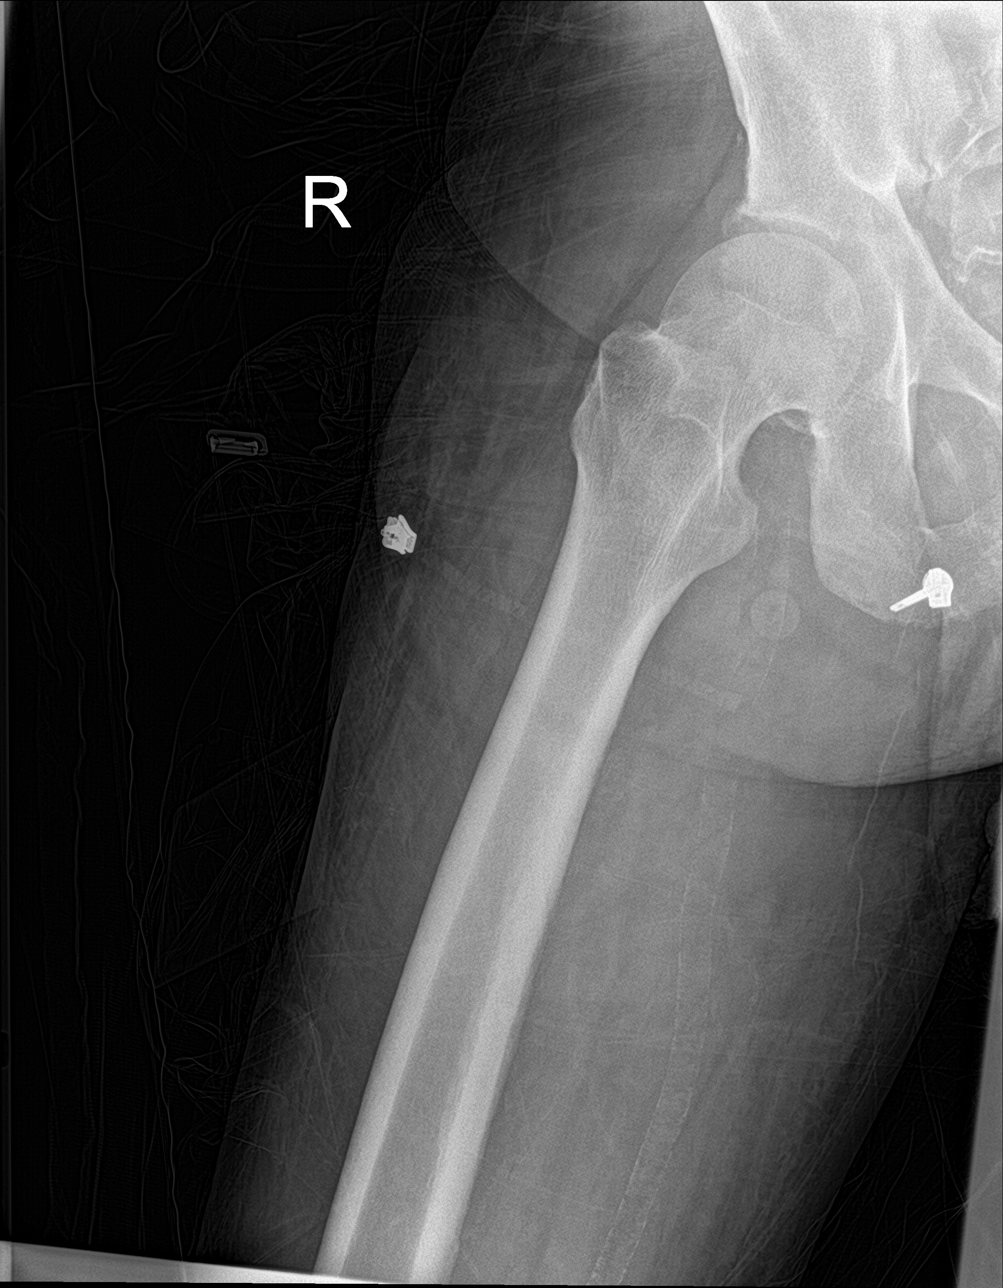

[femur lat (2 of 2)]
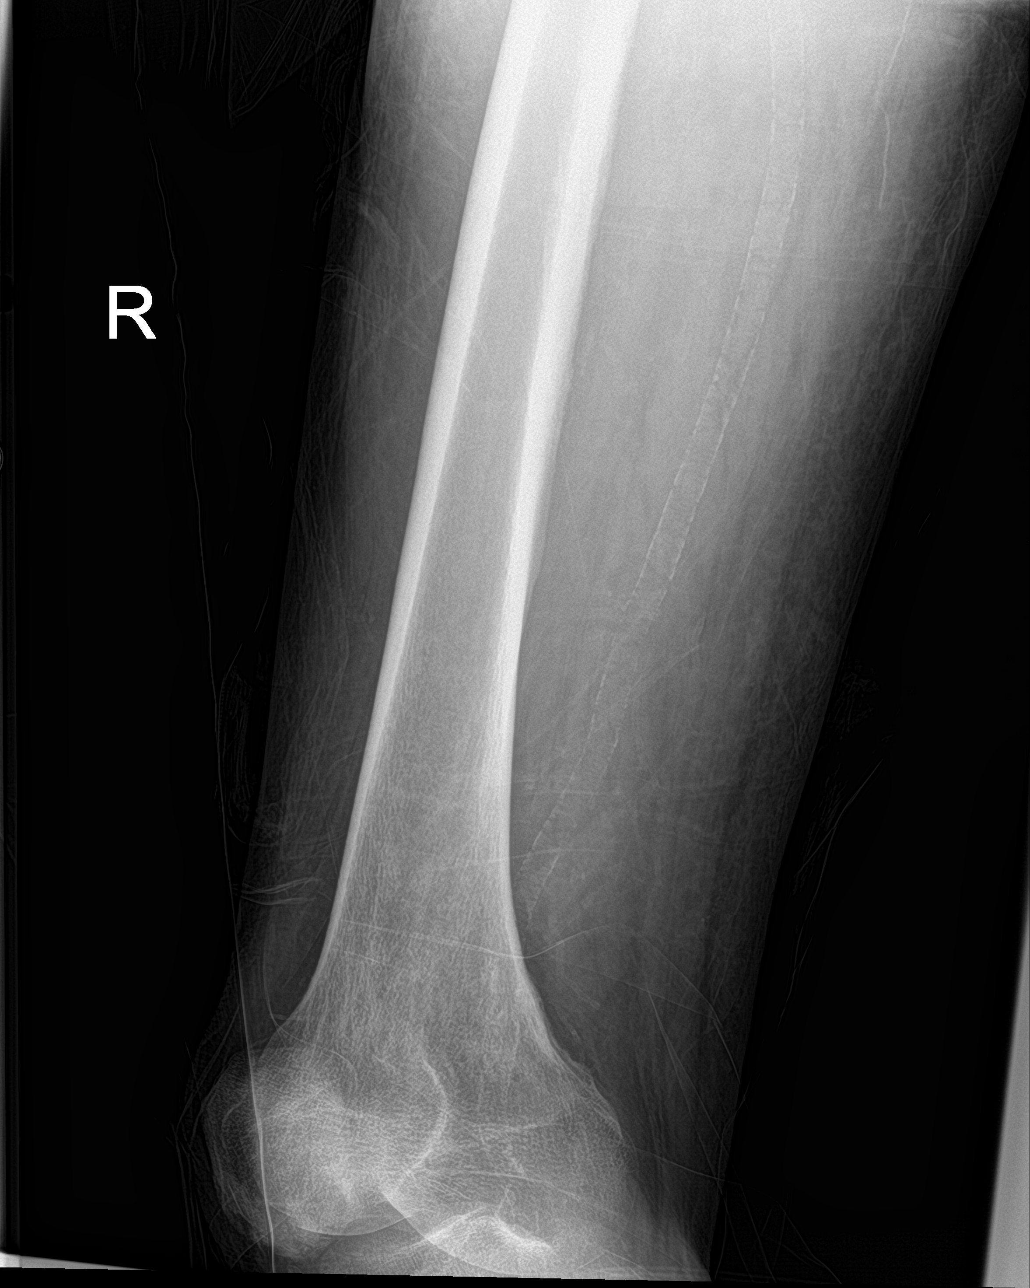

[4 of 4 positions shown; findings below may reference images not displayed]

FINDINGS: There is no evidence of fracture or other focal bone lesions.
Scattered vascular calcifications are seen. No focal soft tissue
swelling is noted.
IMPRESSION: No acute osseous abnormality.

## 2022-07-18 IMAGING — DX DG CHEST 1V PORT
1 series · 1 of 1 positions shown · non-contrast
Comparison: February 24, 2020

CLINICAL DATA: Dyspnea and leg pain

EXAM:
PORTABLE CHEST 1 VIEW

[chest]
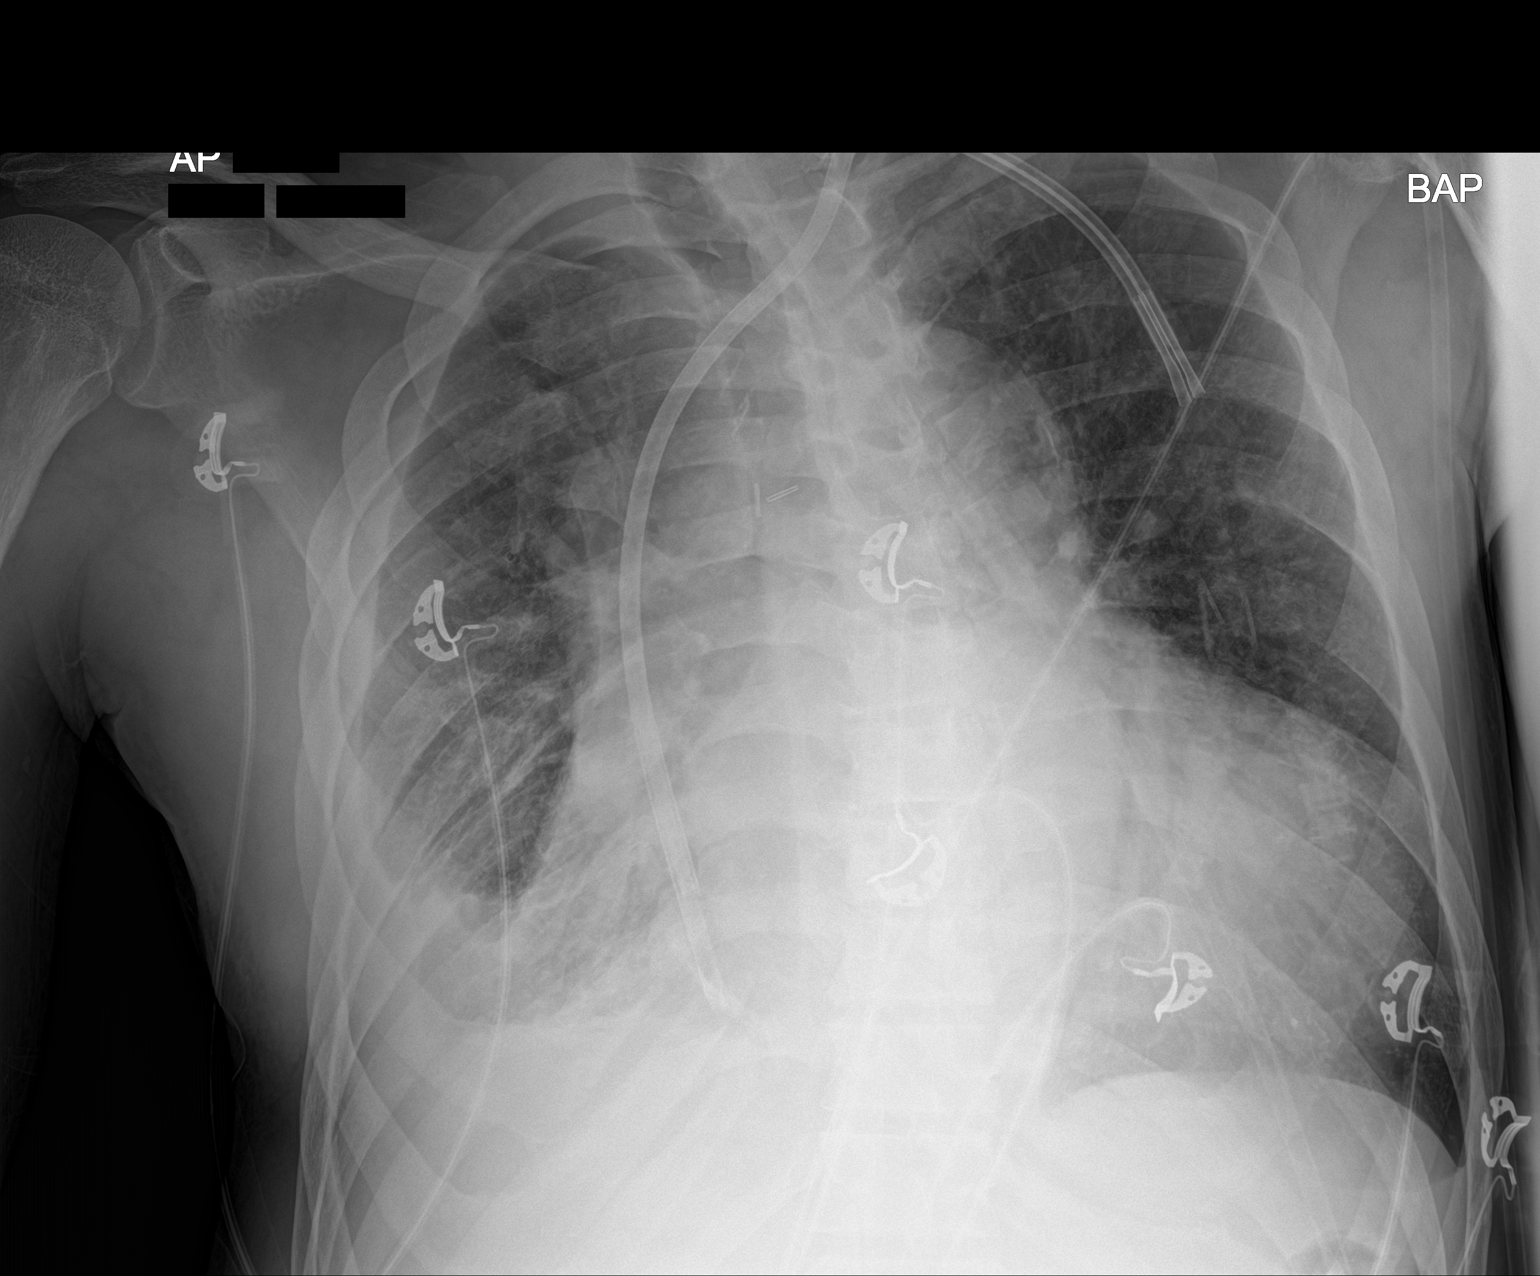

[1 of 1 positions shown; findings below may reference images not displayed]

FINDINGS: Again noted is marked cardiomegaly. Mildly increased interstitial
markings are seen throughout both lungs. A small to moderate
layering right pleural effusion is again noted. Left-sided central
venous catheter seen with the tip in the right atrium. No acute
osseous abnormality.
IMPRESSION: Unchanged cardiomegaly.

Diffusely increased interstitial markings which is likely due to
pulmonary edema.

Moderate right pleural effusion.

## 2022-07-22 IMAGING — CT CT HEAD W/O CM
3 series · 15 of 47 positions shown, 18 images · non-contrast
Comparison: 11/22/2019 head CT and prior. 06/24/2017 CT head and
cervical spine.

CLINICAL DATA: Trauma

EXAM:
CT HEAD WITHOUT CONTRAST
CT CERVICAL SPINE WITHOUT CONTRAST
TECHNIQUE: Multidetector CT imaging of the head and cervical spine was
performed following the standard protocol without intravenous
contrast. Multiplanar CT image reconstructions of the cervical spine
were also generated.

[Series 3: head wo · axial · 0.47mm/px · z∈[-67,+68]mm · 9 of 33 slices shown, 12 images]
[im 3/33  brain]
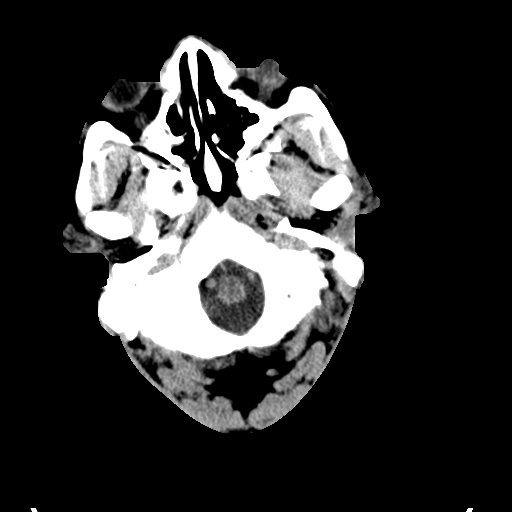
[im 3/33  bone]
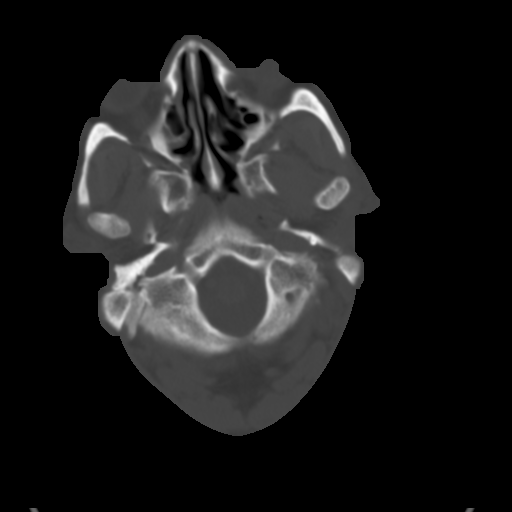
[im 6/33  brain]
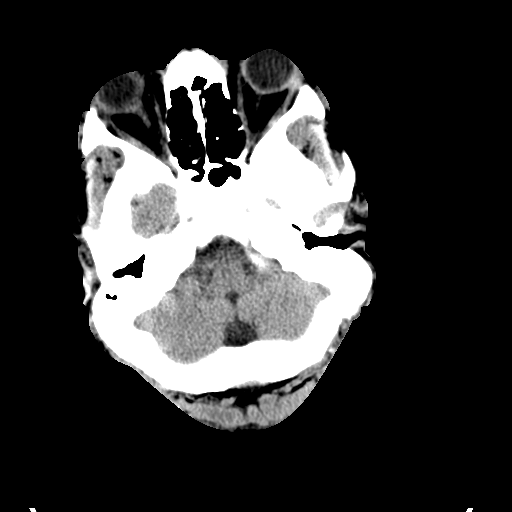
[im 9/33  brain]
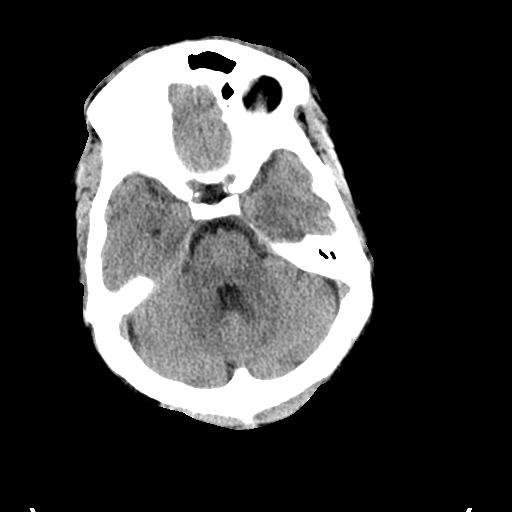
[im 13/33  brain]
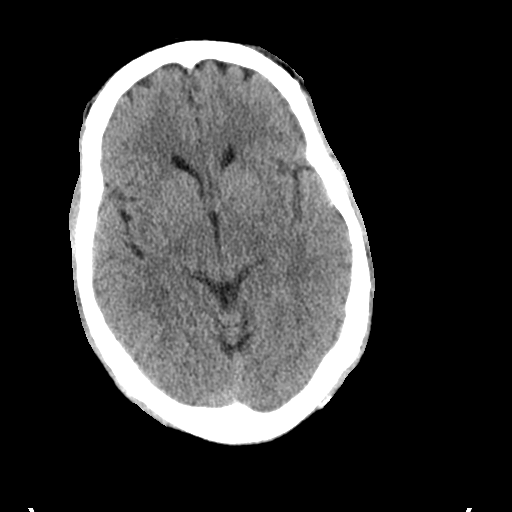
[im 17/33  brain]
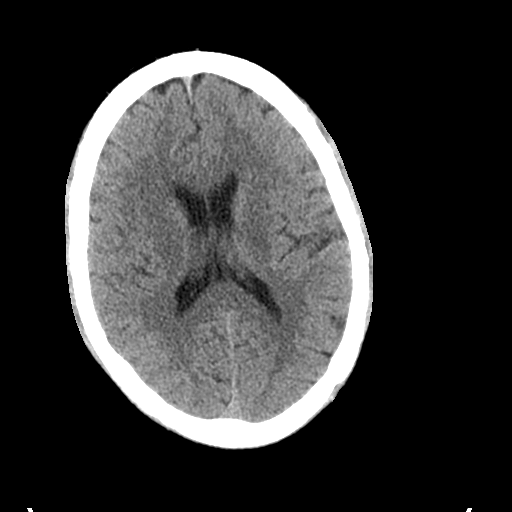
[im 17/33  bone]
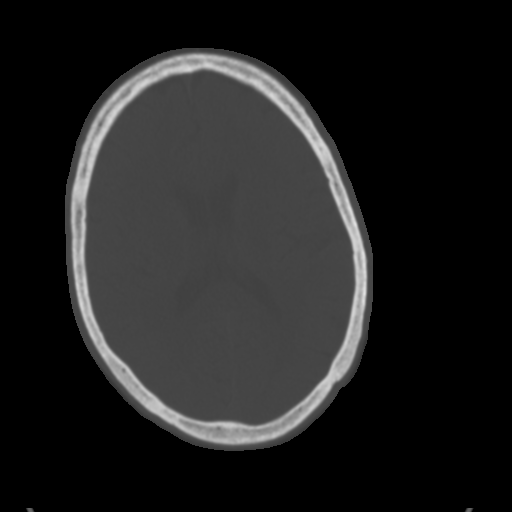
[im 20/33  brain]
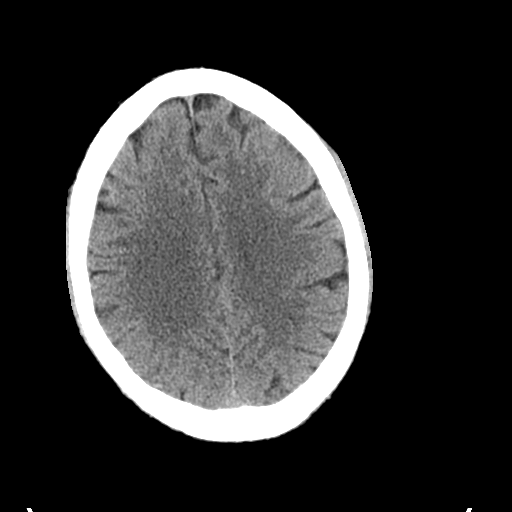
[im 24/33  brain]
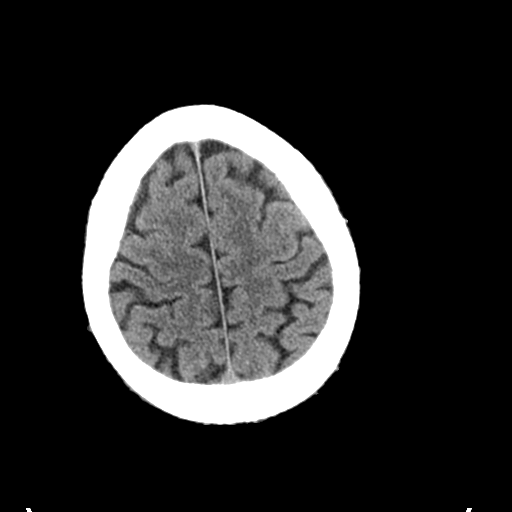
[im 27/33  brain]
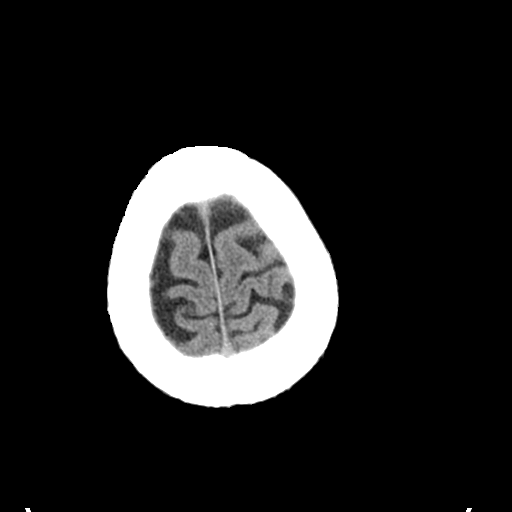
[im 30/33  brain]
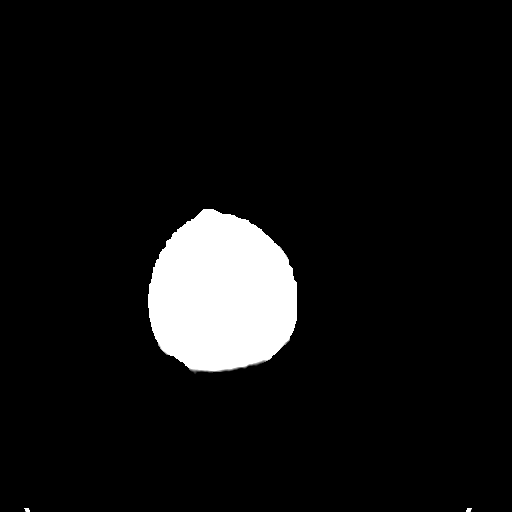
[im 30/33  bone]
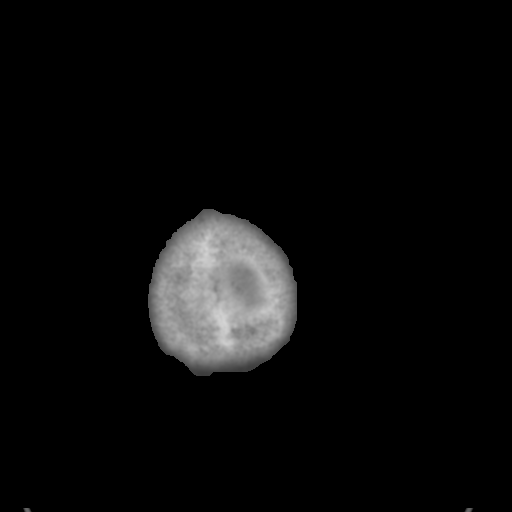

[Series 6: coronal soft tissue · coronal · 0.32mm/px · 3 of 76 slices shown]
[im 26/76  brain]
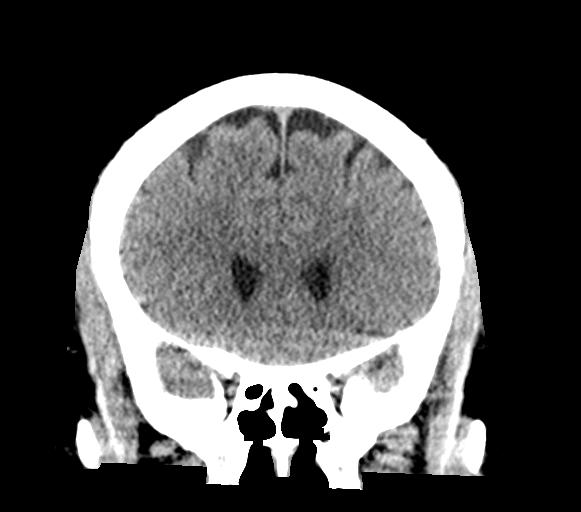
[im 34/76  brain]
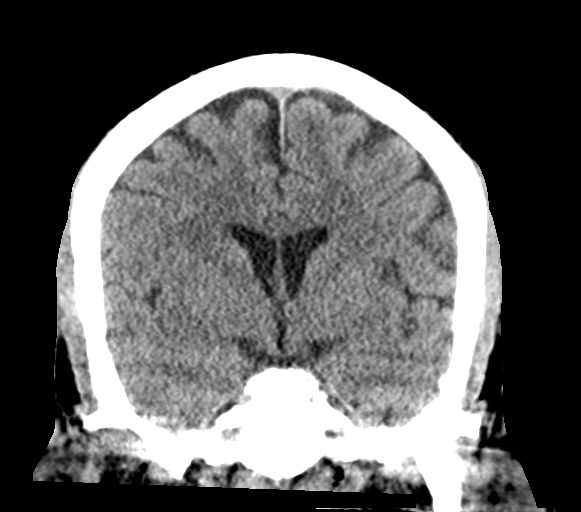
[im 42/76  brain]
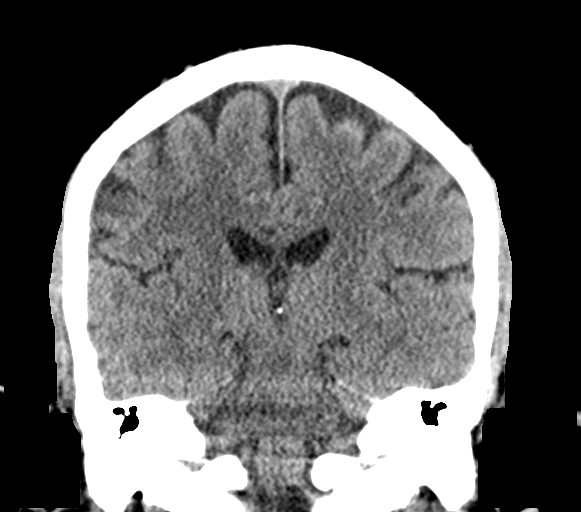

[Series 7: sagittal soft tissue · sagittal · 0.32mm/px · 3 of 68 slices shown]
[im 23/68  brain]
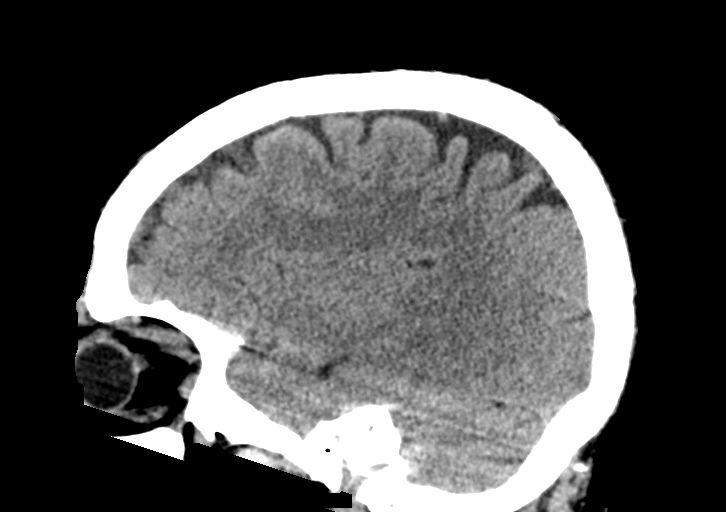
[im 34/68  brain]
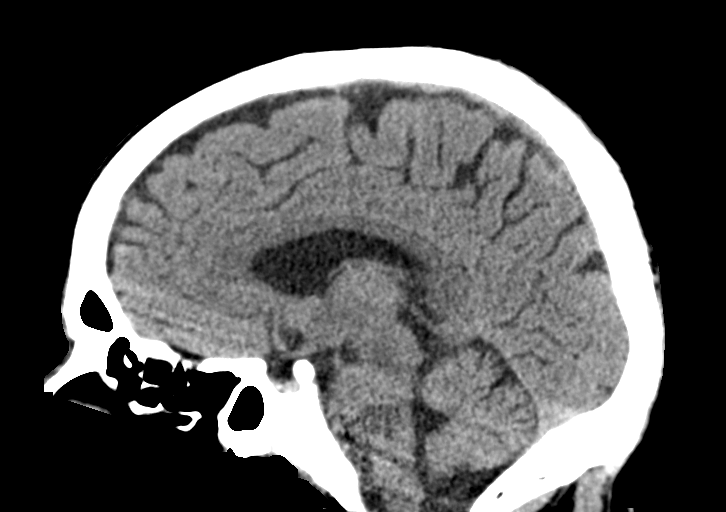
[im 45/68  brain]
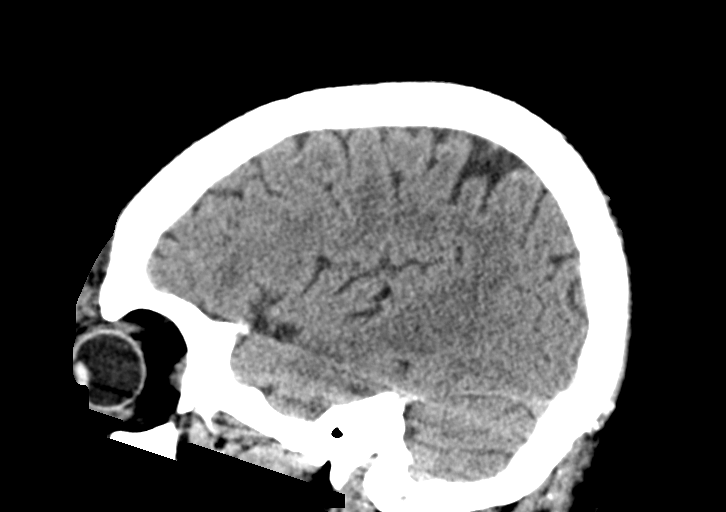

[15 of 47 positions shown; findings below may reference images not displayed]

FINDINGS: CT HEAD FINDINGS

Brain: No acute infarct or intracranial hemorrhage. No mass lesion.
No midline shift, ventriculomegaly or extra-axial fluid collection.

Vascular: No hyperdense vessel. Bilateral carotid siphon
atherosclerotic calcifications.

Skull: Negative for fracture or focal lesion.

Sinuses/Orbits: Globes are intact. Sequela of chronic bilateral
maxillary sinus disease. No mastoid effusion.

Other: Left forehead/supraorbital margin hematoma.

CT CERVICAL SPINE FINDINGS

Alignment: Straightening of cervical lordosis.

Skull base and vertebrae: No acute osseous abnormality.

Soft tissues and spinal canal: No prevertebral fluid or swelling. No
visible canal hematoma.

Disc levels: Patent bony spinal canal and bilateral neural foramina.

Upper chest: Atelectasis.

Other: None.
IMPRESSION: No acute intracranial process. Left forehead/supraorbital margin
hematoma.

No cervical spine fracture or traumatic listhesis.

## 2022-07-22 IMAGING — CT CT CERVICAL SPINE W/O CM
3 of 4 series · 13 of 35 positions shown, 16 images · non-contrast
Comparison: 11/22/[address] head CT and prior. 06/24/2017 CT head and
cervical spine.

CLINICAL DATA: Trauma

EXAM:
CT HEAD WITHOUT CONTRAST
CT CERVICAL SPINE WITHOUT CONTRAST
TECHNIQUE: Multidetector CT imaging of the head and cervical spine was
performed following the standard protocol without intravenous
contrast. Multiplanar CT image reconstructions of the cervical spine
were also generated.

[Series 6: orthogonal bone · axial · 0.26mm/px · z∈[-286,-111]mm · 5 of 134 slices shown, 7 images]
[im 20/134  soft-tissue]
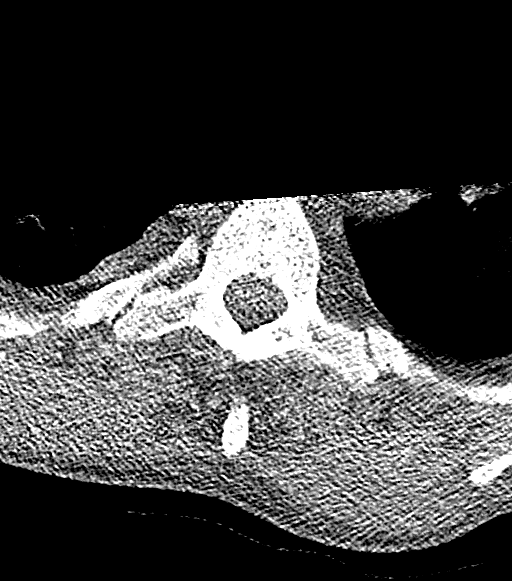
[im 20/134  bone]
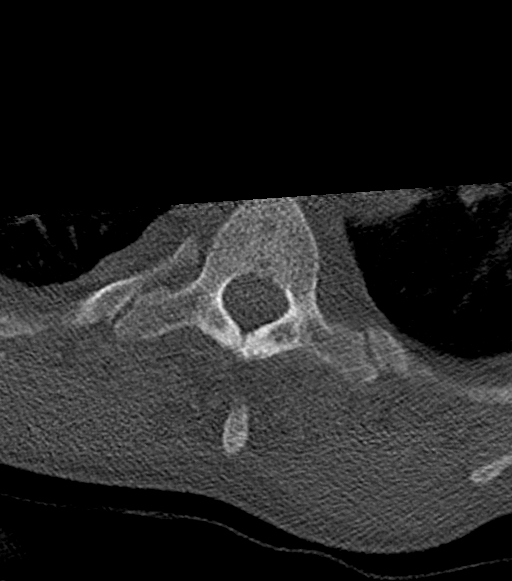
[im 39/134  bone]
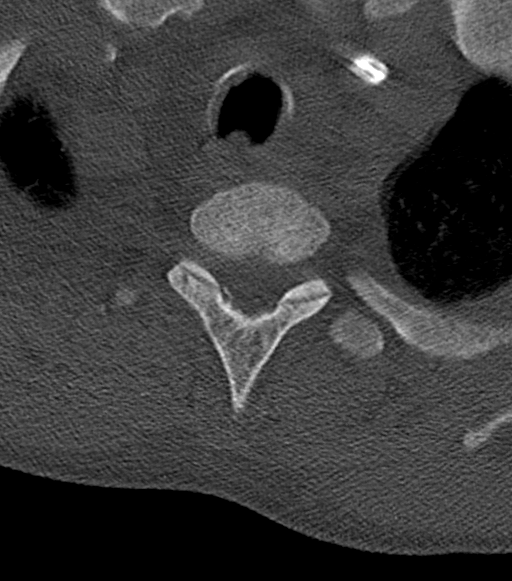
[im 77/134  bone]
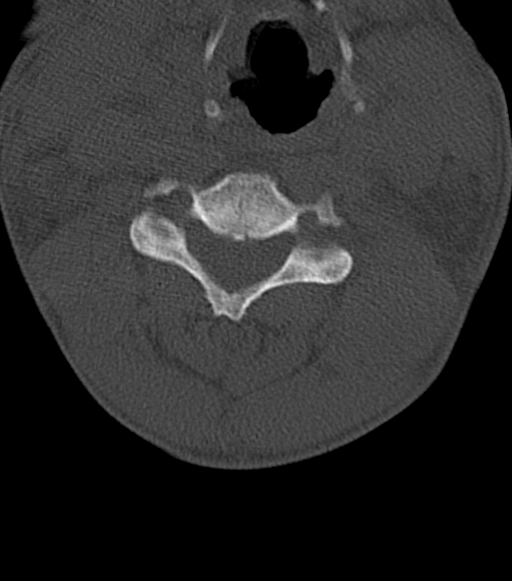
[im 96/134  bone]
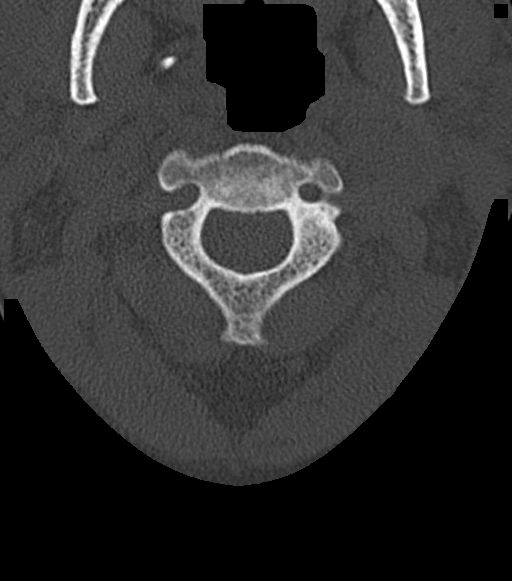
[im 115/134  soft-tissue]
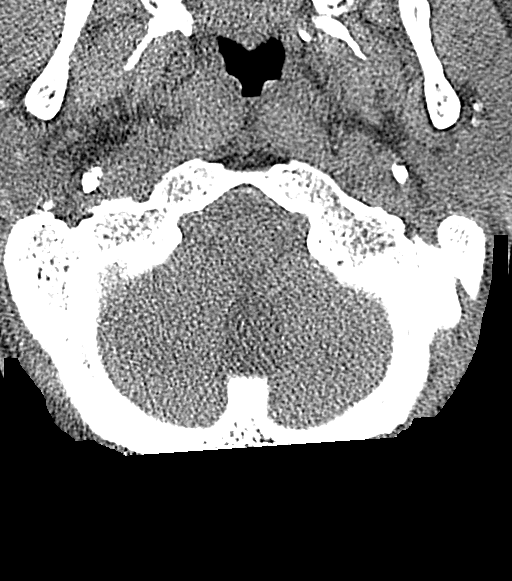
[im 115/134  bone]
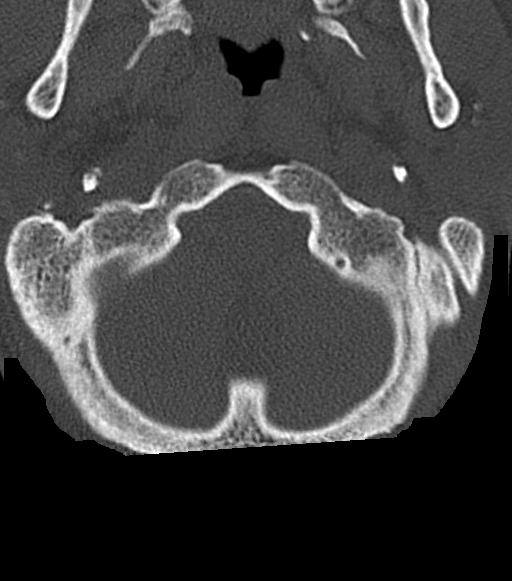

[Series 7: coronal bone · coronal · 0.29mm/px · 3 of 96 slices shown]
[im 20/96  bone]
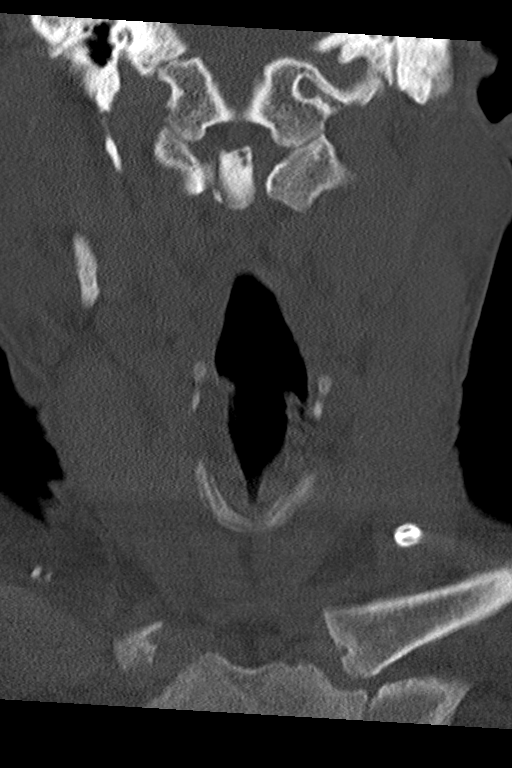
[im 39/96  bone]
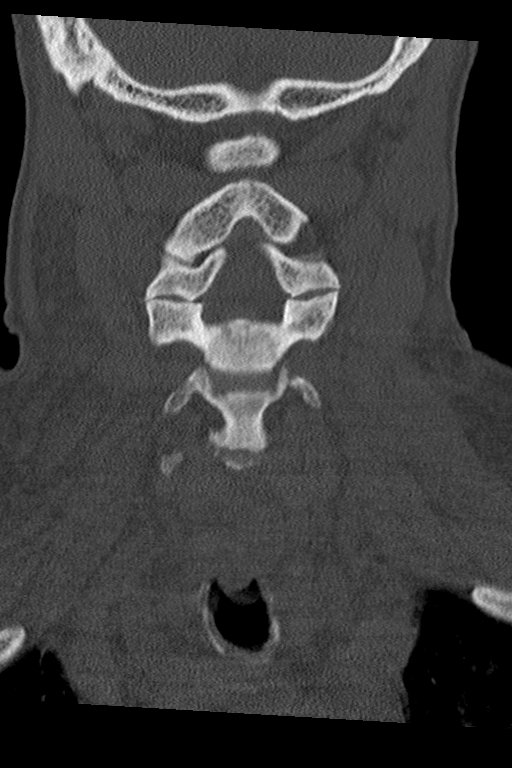
[im 58/96  bone]
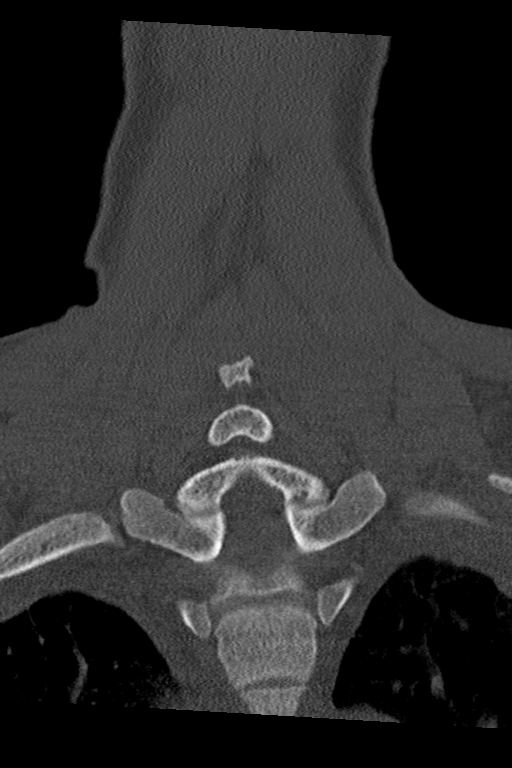

[Series 8: sagittal bone · sagittal · 0.43mm/px · 5 of 61 slices shown, 6 images]
[im 21/61  bone]
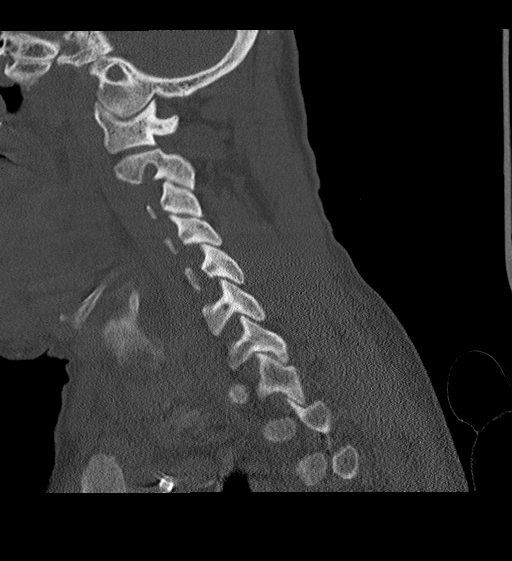
[im 26/61  bone]
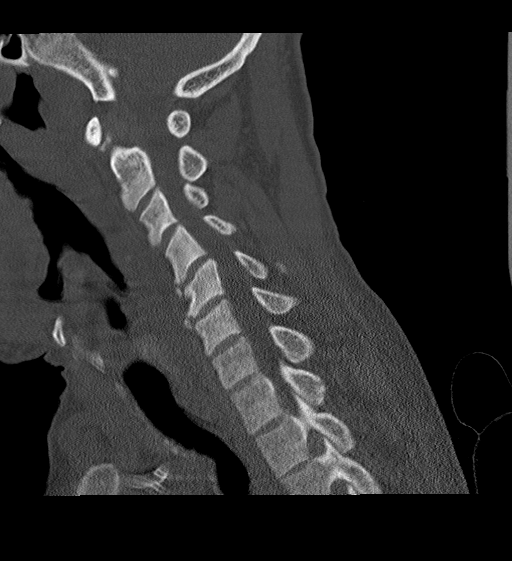
[im 31/61  soft-tissue]
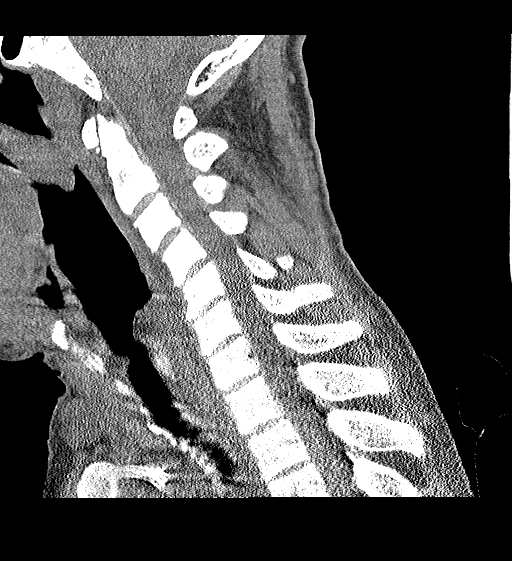
[im 31/61  bone]
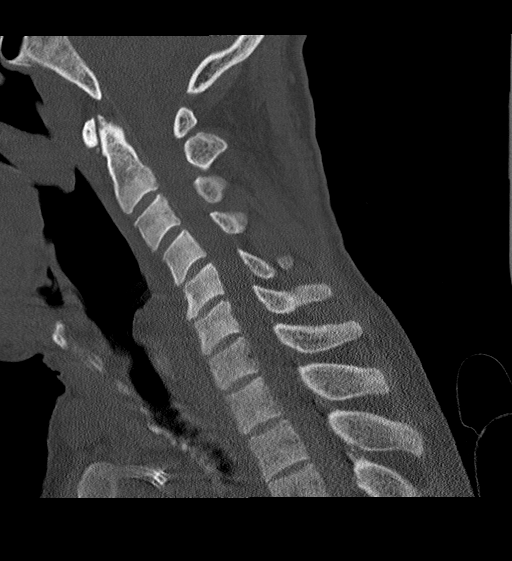
[im 36/61  bone]
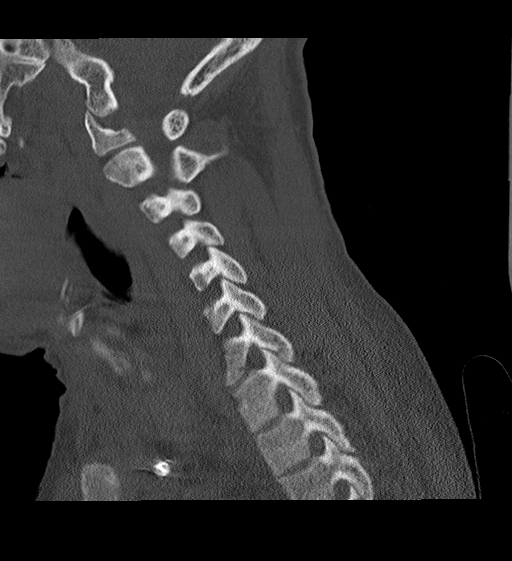
[im 41/61  bone]
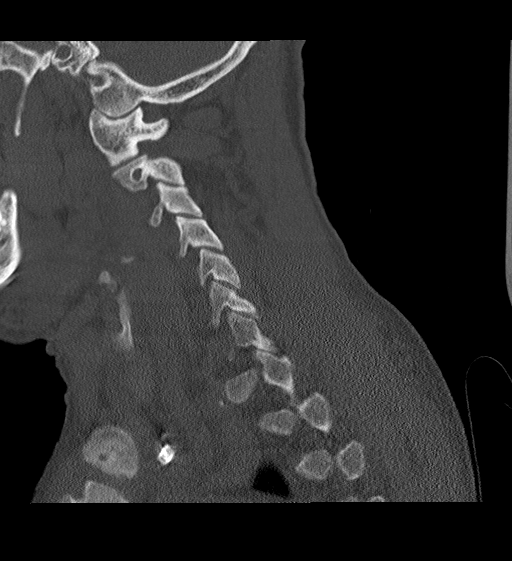

[13 of 35 positions shown; findings below may reference images not displayed]

FINDINGS: CT HEAD FINDINGS

Brain: No acute infarct or intracranial hemorrhage. No mass lesion.
No midline shift, ventriculomegaly or extra-axial fluid collection.

Vascular: No hyperdense vessel. Bilateral carotid siphon
atherosclerotic calcifications.

Skull: Negative for fracture or focal lesion.

Sinuses/Orbits: Globes are intact. Sequela of chronic bilateral
maxillary sinus disease. No mastoid effusion.

Other: Left forehead/supraorbital margin hematoma.

CT CERVICAL SPINE FINDINGS

Alignment: Straightening of cervical lordosis.

Skull base and vertebrae: No acute osseous abnormality.

Soft tissues and spinal canal: No prevertebral fluid or swelling. No
visible canal hematoma.

Disc levels: Patent bony spinal canal and bilateral neural foramina.

Upper chest: Atelectasis.

Other: None.
IMPRESSION: No acute intracranial process. Left forehead/supraorbital margin
hematoma.

No cervical spine fracture or traumatic listhesis.

## 2022-07-25 IMAGING — CR DG PELVIS 1-2V
1 series · 1 of 1 positions shown · non-contrast
Comparison: CT 01/30/2020

CLINICAL DATA: Fall 2 days ago with right hip pain.

EXAM:
PELVIS - 1-2 VIEW

[x pelvis]
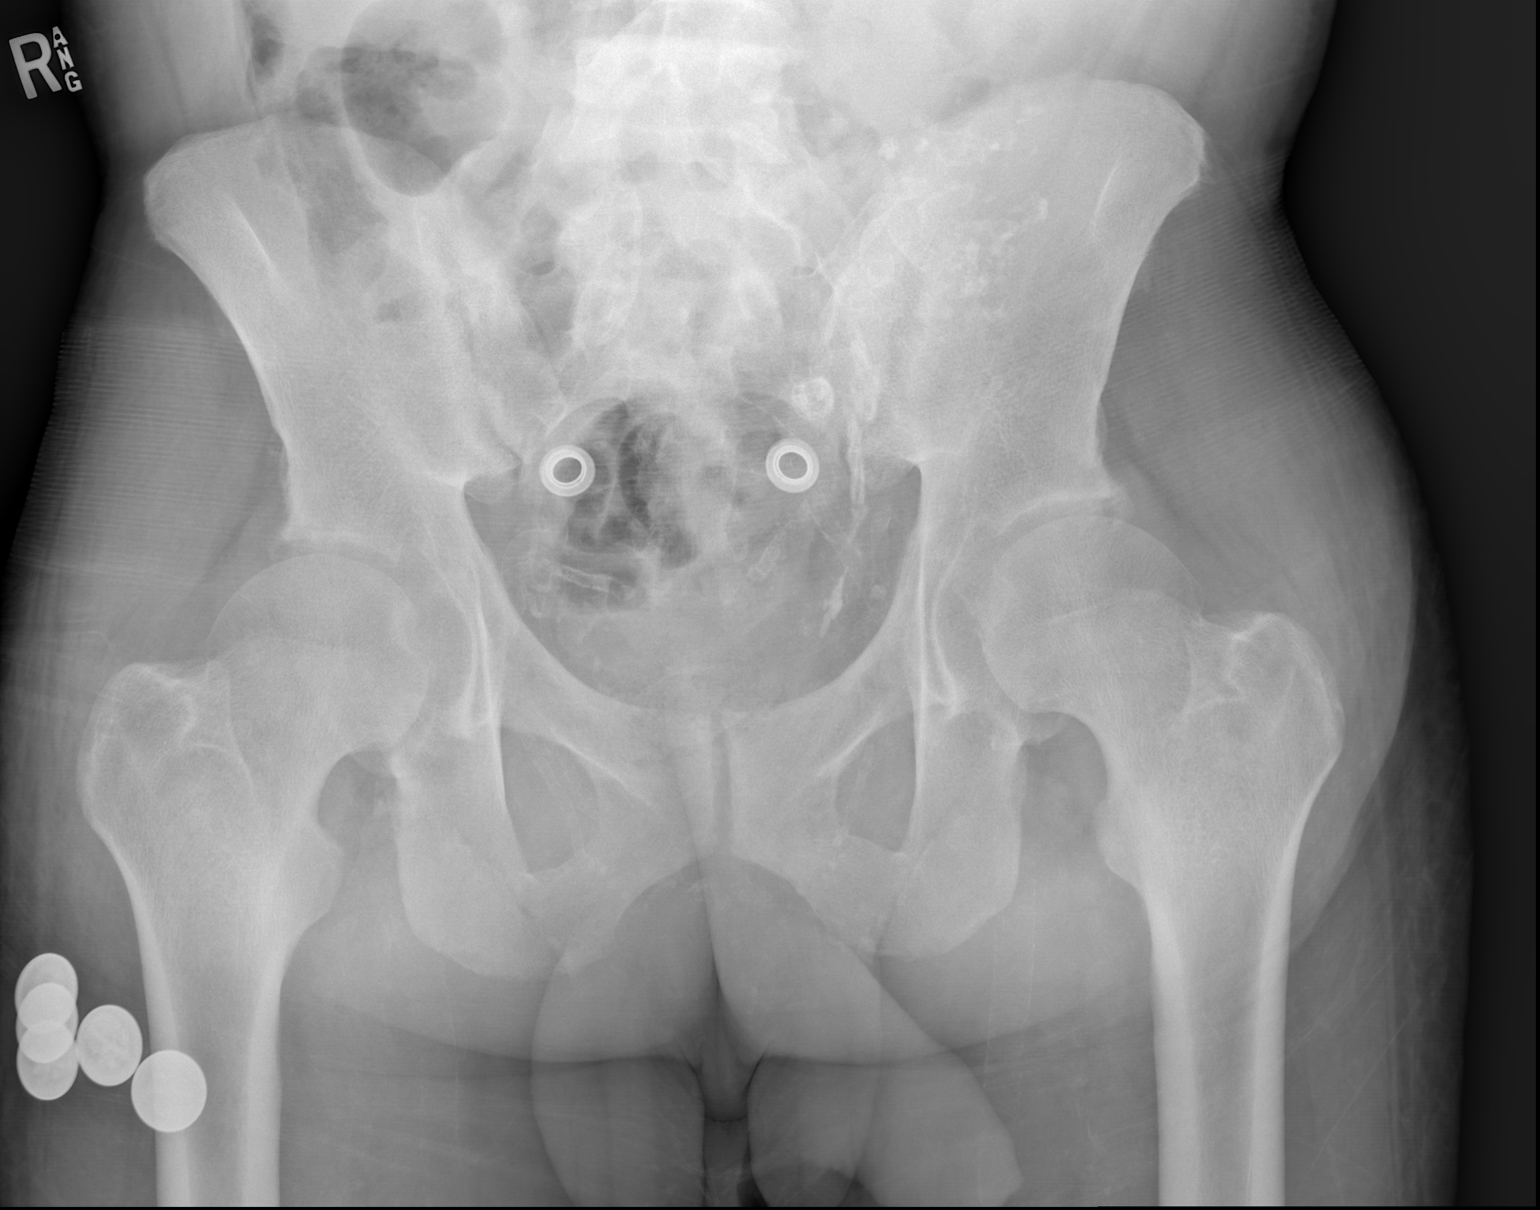

[1 of 1 positions shown; findings below may reference images not displayed]

FINDINGS: AP view of the pelvis. Femoral heads are located. No acute fracture.
Degenerative sclerosis of both sacroiliac joints unchanged. Mild
renal osteodystrophy. Vascular calcifications.
IMPRESSION: No acute osseous abnormality.

## 2022-07-29 IMAGING — CT CT ANGIO CHEST
2 of 7 series · 18 of 46 positions shown · IV contrast (APPLIED)
Comparison: August 28, 2018

CLINICAL DATA: Shortness of breath.

EXAM:
CT ANGIOGRAPHY CHEST WITH CONTRAST
TECHNIQUE: Multidetector CT imaging of the chest was performed using the
standard protocol during bolus administration of intravenous
contrast. Multiplanar CT image reconstructions and MIPs were
obtained to evaluate the vascular anatomy.
CONTRAST:  80mL OMNIPAQUE IOHEXOL 350 MG/ML SOLN

[Series 8: thins · axial · 0.63mm/px · z∈[-215,+44]mm · 15 of 418 slices shown]
[im 24/418  lung]
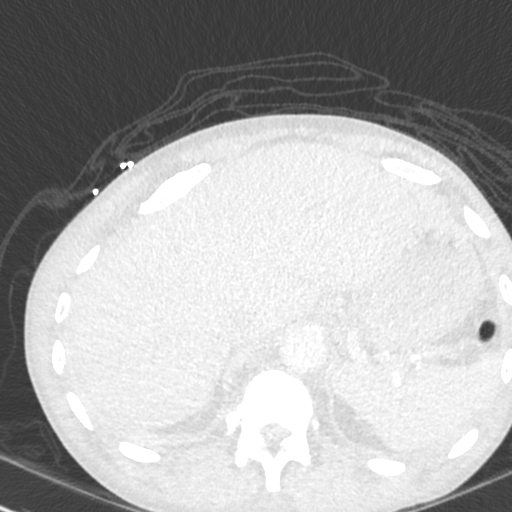
[im 47/418  soft-tissue]
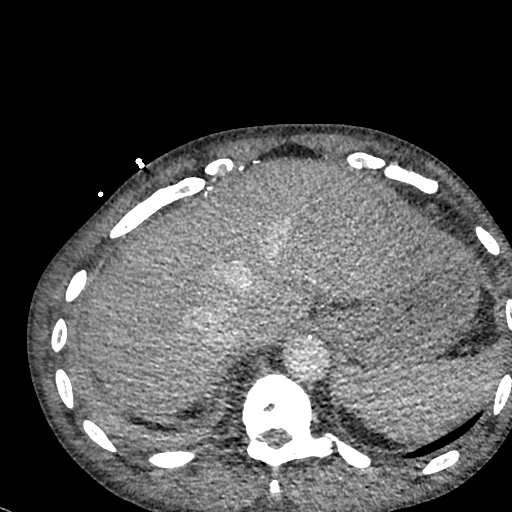
[im 70/418  lung]
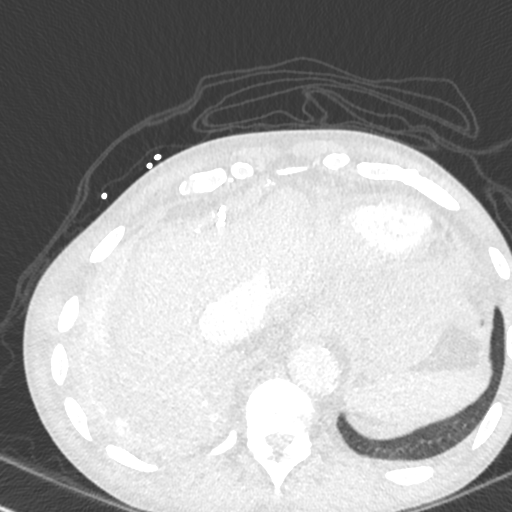
[im 93/418  soft-tissue]
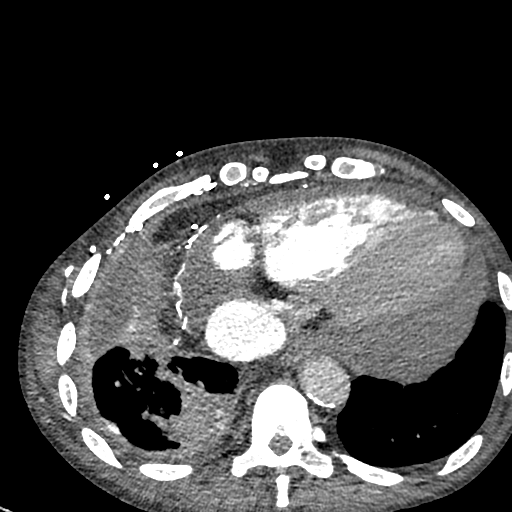
[im 140/418  lung]
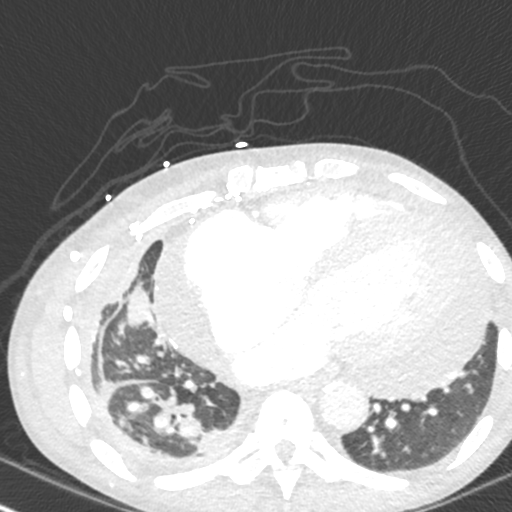
[im 163/418  soft-tissue]
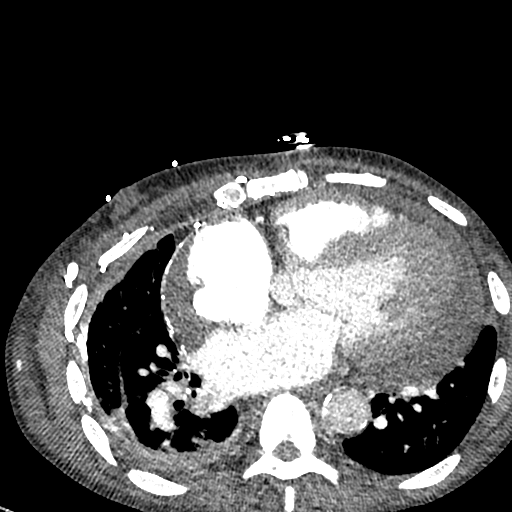
[im 186/418  lung]
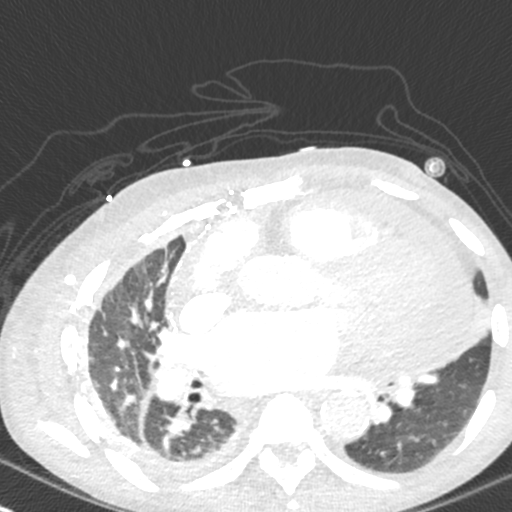
[im 209/418  soft-tissue]
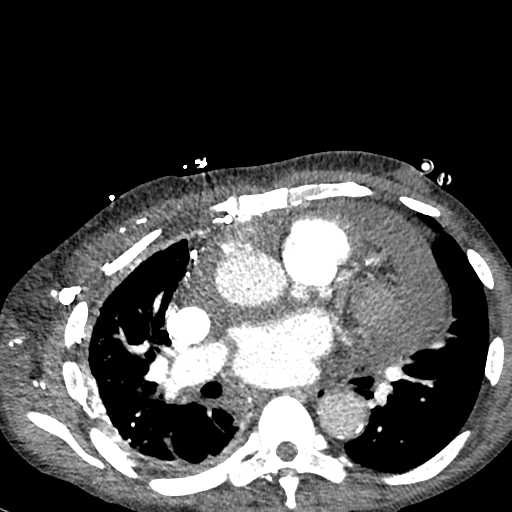
[im 232/418  lung]
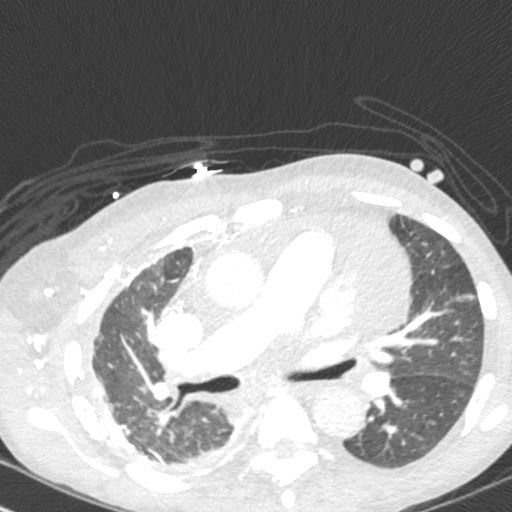
[im 255/418  soft-tissue]
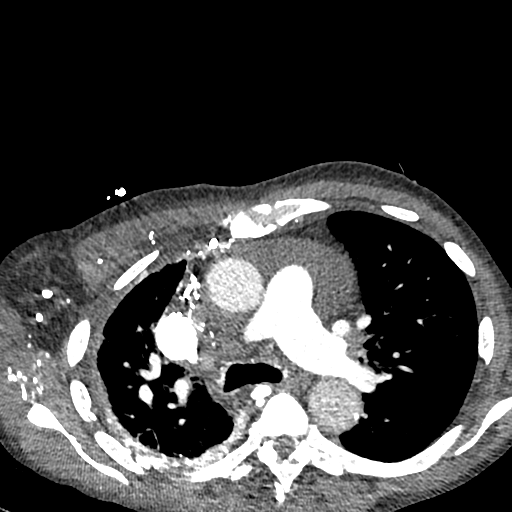
[im 279/418  lung]
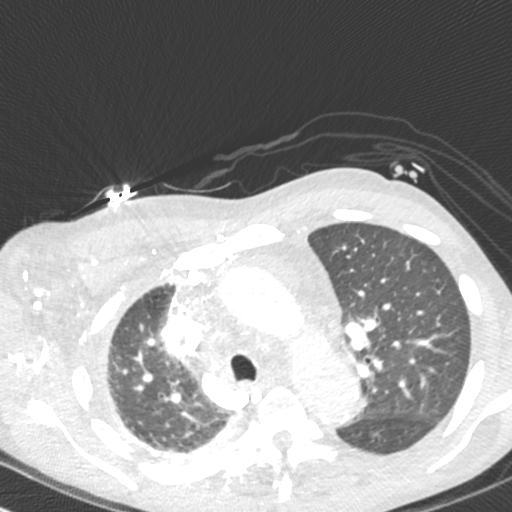
[im 325/418  soft-tissue]
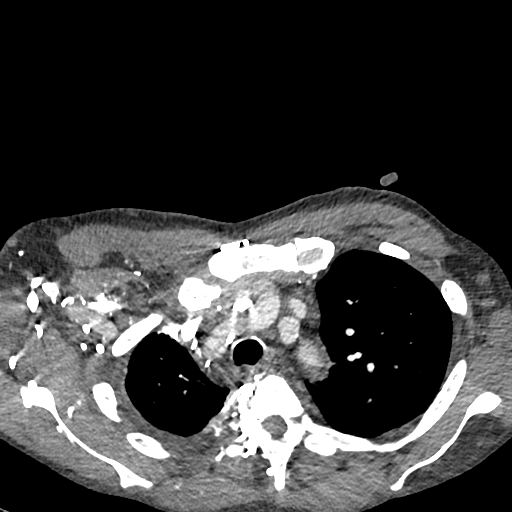
[im 348/418  lung]
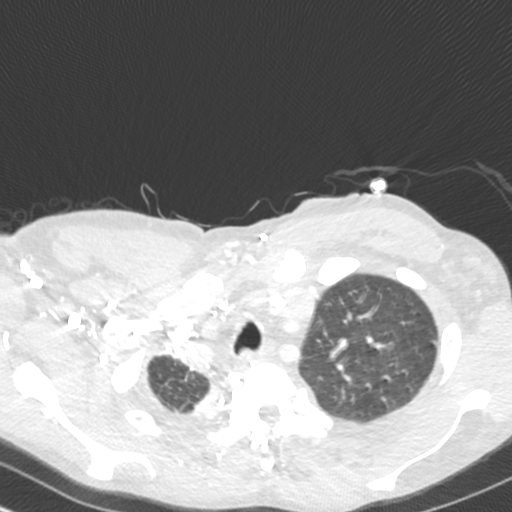
[im 371/418  soft-tissue]
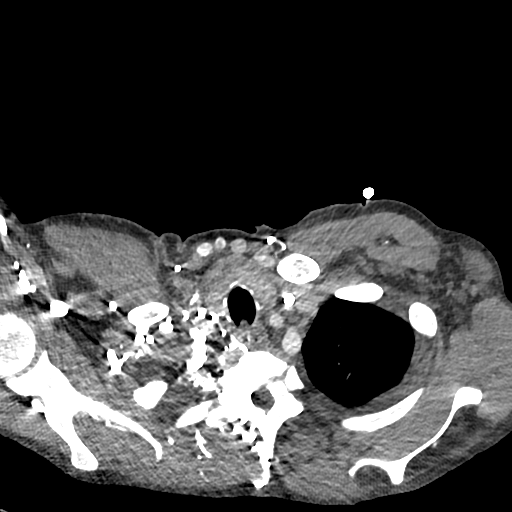
[im 394/418  lung]
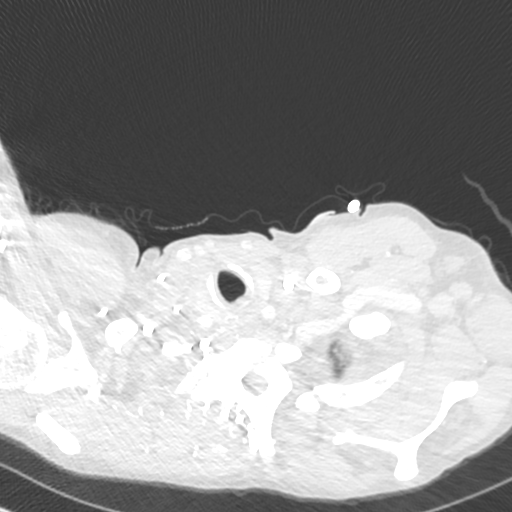

[Series 9: cor · coronal · 0.62mm/px · 3 of 152 slices shown]
[im 38/152  soft-tissue]
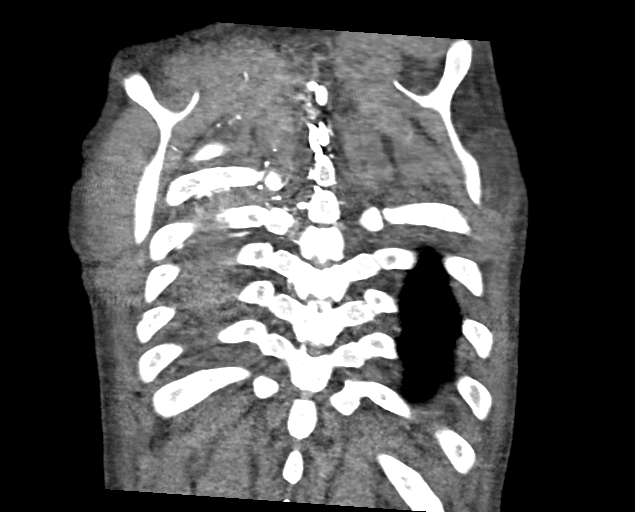
[im 76/152  soft-tissue]
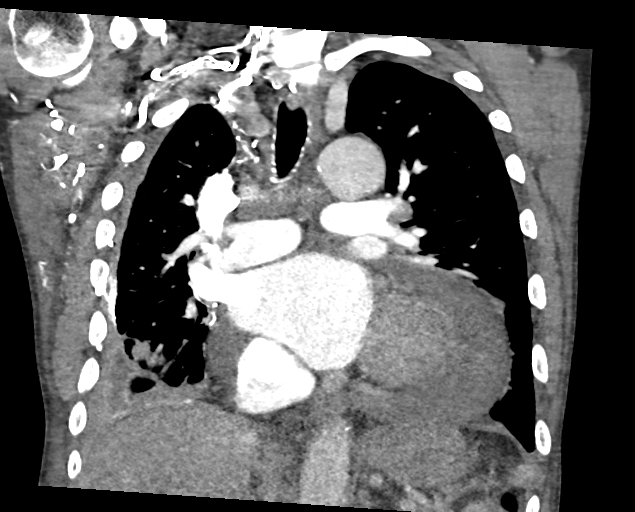
[im 114/152  soft-tissue]
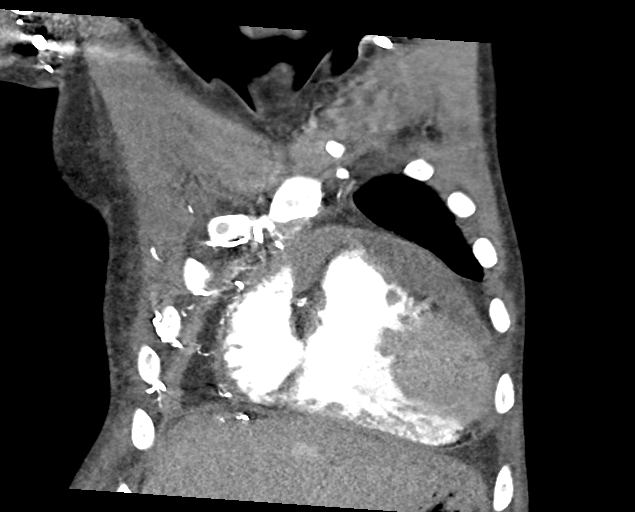

[18 of 46 positions shown; findings below may reference images not displayed]

FINDINGS: Cardiovascular: There is moderate to marked severity calcification
of the aortic arch. A small amount of intraluminal low attenuation
is seen within a posterior lower lobe branch of the right pulmonary
artery (axial CT images 95 through 103, CT series number 6). There
is marked severity cardiomegaly. A large pericardial effusion is
seen this measures approximately 3.2 cm in maximum thickness and is
mildly increased in size when compared to the prior exam.

Mediastinum/Nodes:

Lungs/Pleura: Predominant stable, persistent right-sided volume loss
is seen when compared to the prior exam.

A stable 4.9 cm x 3.6 cm area of masslike consolidation is seen
within the anterior aspect of the right lung base. An additional
stable, similar appearing 5.0 cm x 3.6 cm

Area of masslike consolidation is in the within the posteromedial
aspect of the right lung base.

A small right-sided pleural effusion is seen. A 6.5 cm x 1.7 cm
loculated component is seen along the anterolateral aspect of the
right lung base.

No pneumothorax is identified.

Upper Abdomen: No acute abnormality.

Musculoskeletal: Diffusely sclerotic osseous structures are seen.
This is stable in severity when compared to the prior study.

Review of the MIP images confirms the above findings.
IMPRESSION: 1. Small amount of pulmonary embolism within a posterior lower lobe
branch of the right pulmonary artery.
2. Large pericardial effusion, mildly increased in size when
compared to the prior exam.
3. Predominant stable right-sided volume loss with stable areas of
masslike consolidation within the anterior aspect of the right lung
base and posteromedial aspect of the right lung base.
4. Small right-sided pleural effusion.
5. Diffusely sclerotic osseous structures, likely secondary to renal
osteodystrophy.
6. Aortic atherosclerosis.

Aortic Atherosclerosis ([id number].P).

## 2022-07-29 IMAGING — DX DG CHEST 1V PORT
1 series · 1 of 1 positions shown · non-contrast
Comparison: February 28, 2020

CLINICAL DATA: Shortness of breath.

EXAM:
PORTABLE CHEST 1 VIEW

[chest ap]
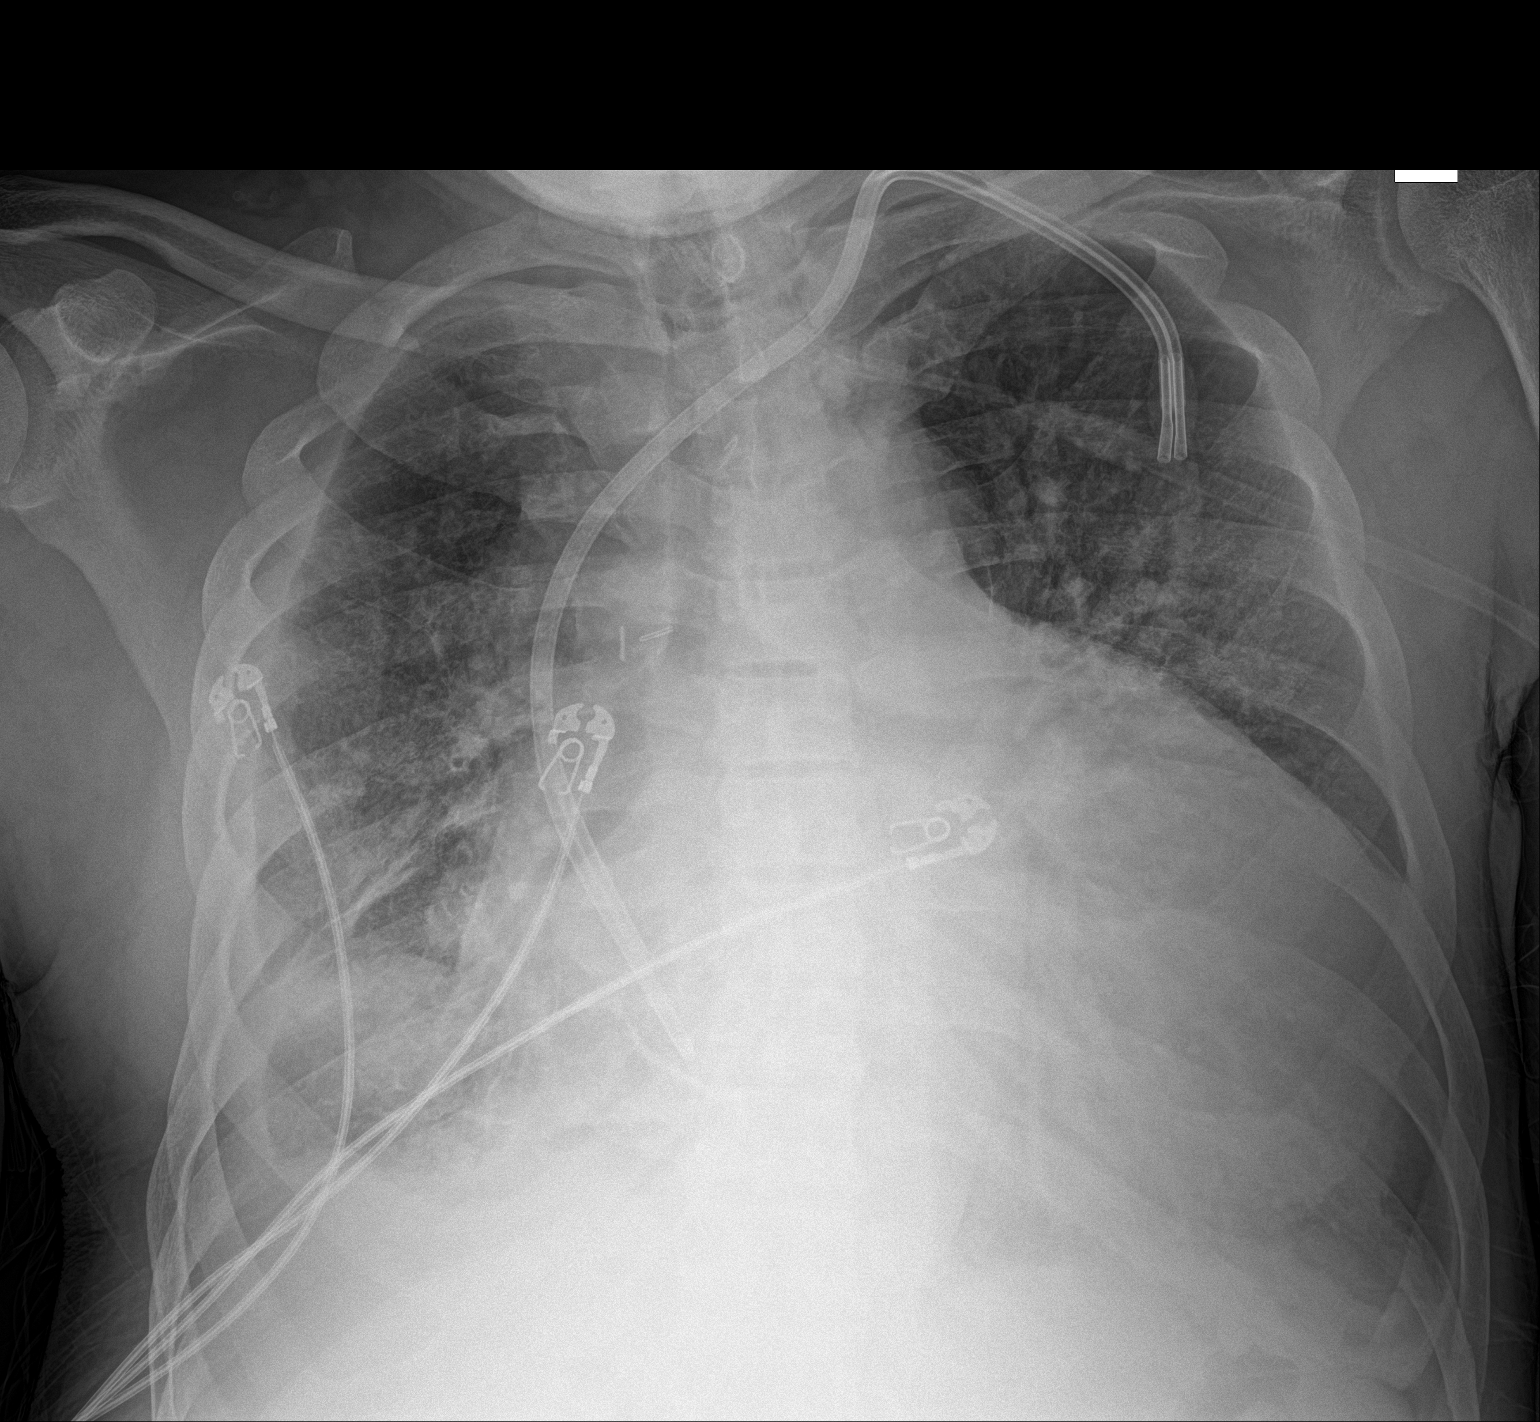

[1 of 1 positions shown; findings below may reference images not displayed]

FINDINGS: There is stable left-sided venous catheter positioning. Moderate
severity diffuse chronic appearing increased interstitial lung
markings are noted. Mild atelectasis and/or infiltrate is seen
within the right lung base. There is a small to moderate size right
pleural effusion which is stable in size. The cardiac silhouette is
markedly enlarged. Radiopaque surgical clips are seen overlying the
right hilum. The visualized skeletal structures are unremarkable.
IMPRESSION: 1. Chronic appearing increased interstitial lung markings with mild
right basilar atelectasis and/or infiltrate.
2. Small to moderate size right pleural effusion, stable in size
when compared to the prior study.

## 2022-07-30 IMAGING — DX DG ABDOMEN 1V
1 series · 1 of 1 positions shown · non-contrast
Comparison: CT 01/30/2020.

CLINICAL DATA: Shortness of breath. Nausea vomiting. History of
dialysis.

EXAM:
ABDOMEN - 1 VIEW

[abdomen kub]
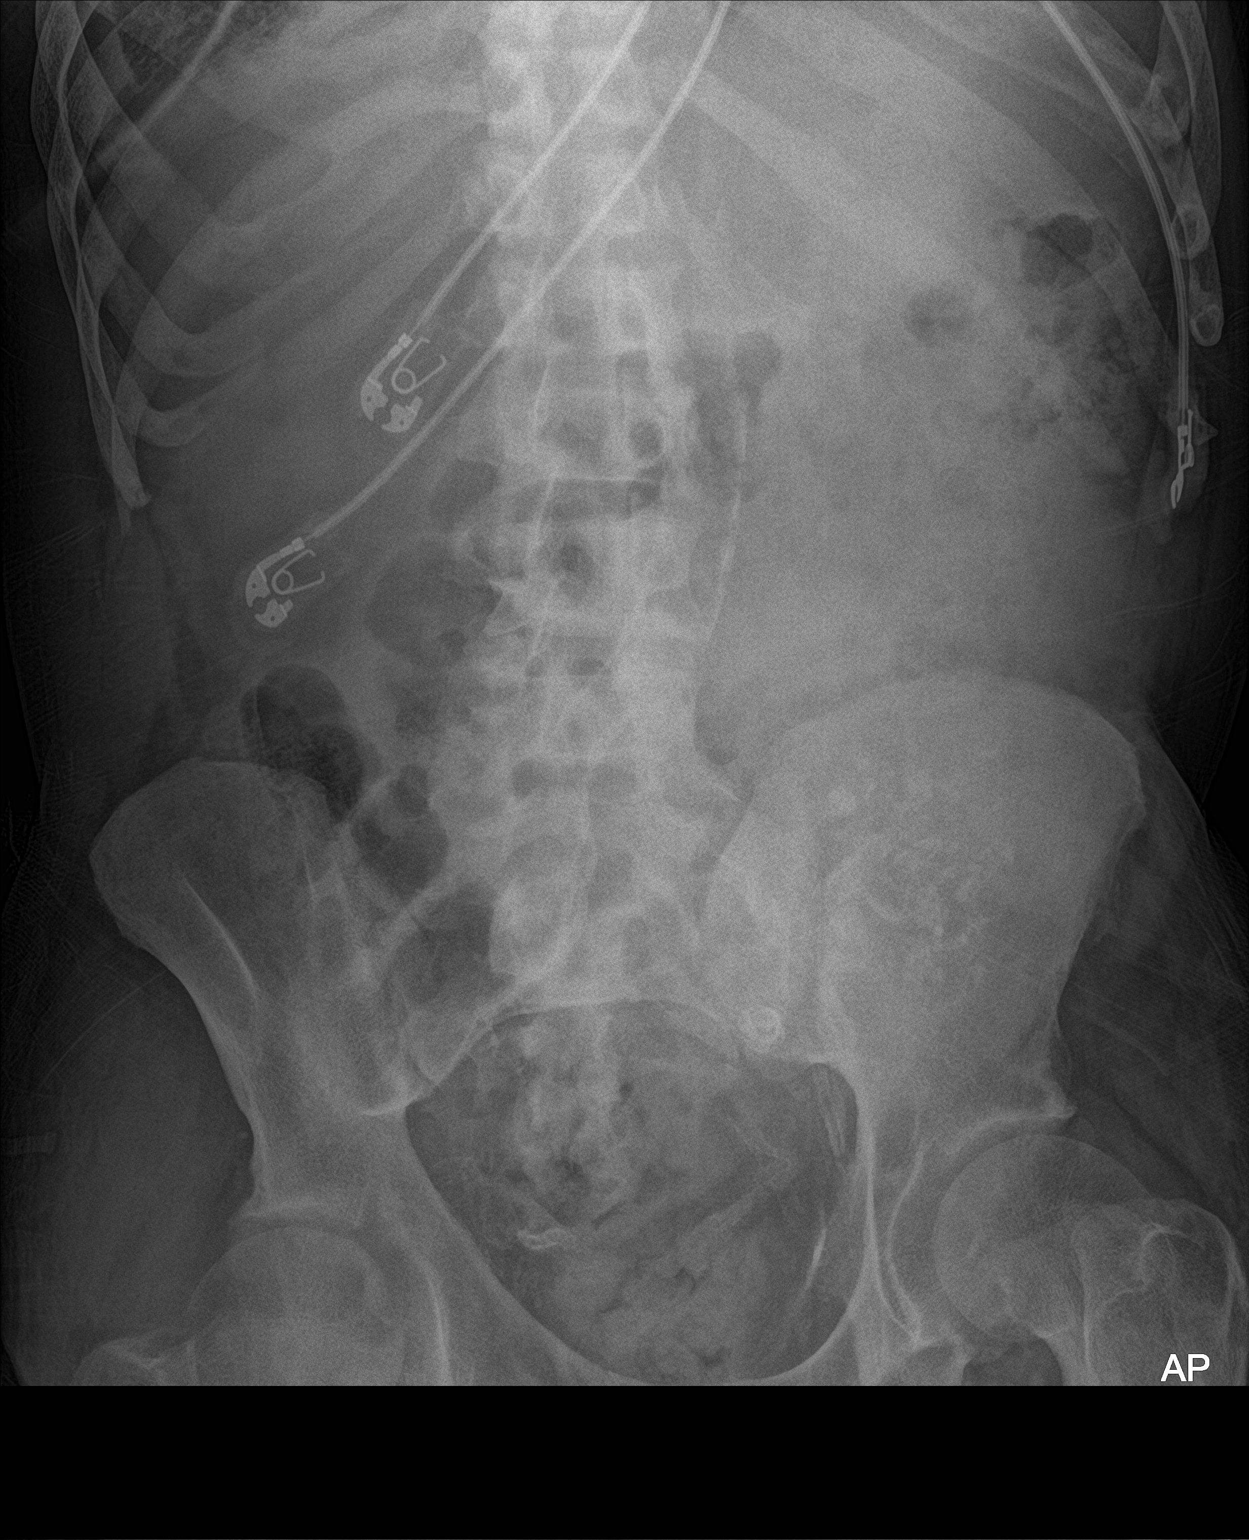

[1 of 1 positions shown; findings below may reference images not displayed]

FINDINGS: Soft tissue structures are unremarkable. No bowel distention. Stool
noted throughout the colon. No free air. Hemidiaphragms incompletely
imaged. Aortoiliac atherosclerotic vascular calcification. Calcified
renal transplant again noted over the left pelvis no acute bony
abnormality identified.
IMPRESSION: No acute intra-abdominal abnormality identified.

## 2022-07-31 IMAGING — CT CT CTA ABD/PEL W/CM AND/OR W/O CM
2 of 12 series · 11 of 46 positions shown, 15 images · IV contrast (APPLIED)
Comparison: 03/09/2020, 01/30/2020

CLINICAL DATA: Severe left upper quadrant abdominal pain, concern
mesenteric ischemia

EXAM:
CTA ABDOMEN AND PELVIS WITH CONTRAST
TECHNIQUE: Multidetector CT imaging of the abdomen and pelvis was performed
using the standard protocol during bolus administration of
intravenous contrast. Multiplanar reconstructed images and MIPs were
obtained and reviewed to evaluate the vascular anatomy.
CONTRAST:  100mL OMNIPAQUE IOHEXOL 350 MG/ML SOLN

[Series 9: cor · coronal · 0.73mm/px · 2 of 129 slices shown]
[im 43/129  soft-tissue]
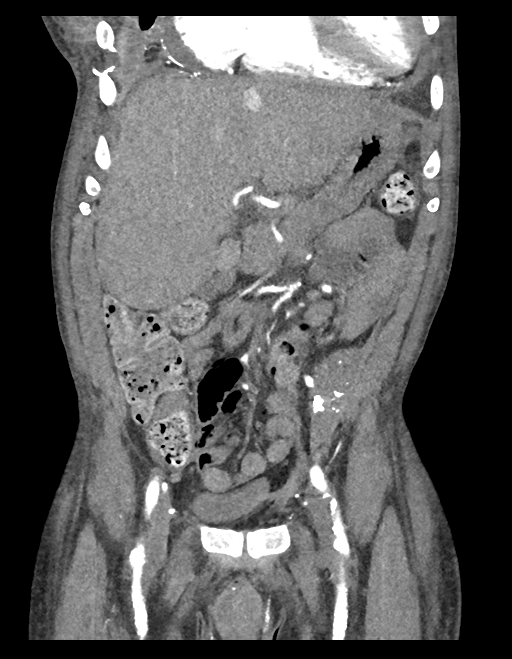
[im 86/129  soft-tissue]
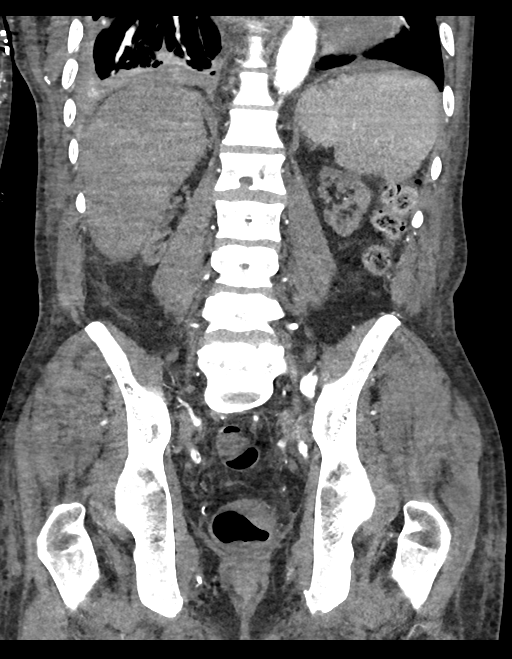

[Series 14: venous thins · axial · portal-venous · 0.73mm/px · z∈[-405,-24]mm · 9 of 1146 slices shown, 13 images]
[im 96/1146  soft-tissue]
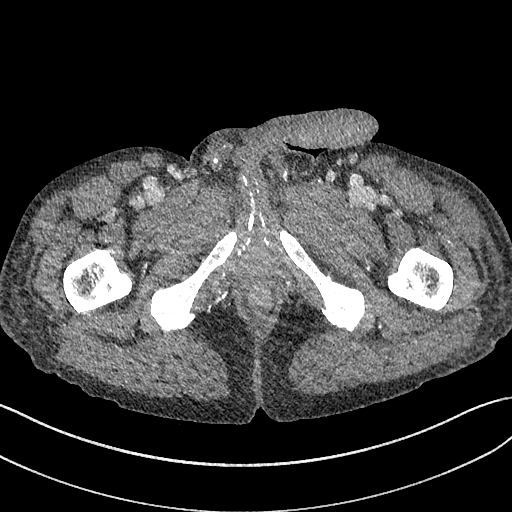
[im 96/1146  bone]
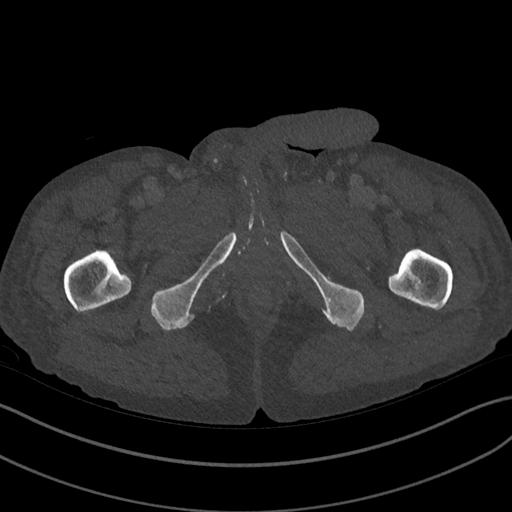
[im 287/1146  soft-tissue]
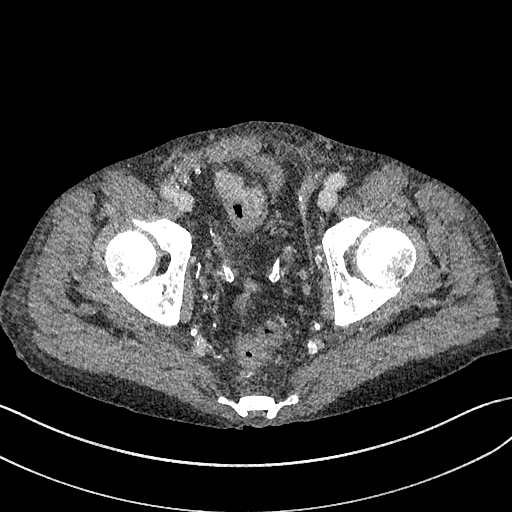
[im 382/1146  soft-tissue]
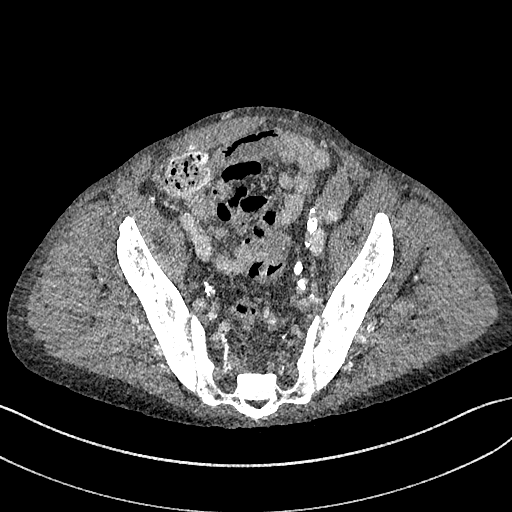
[im 478/1146  soft-tissue]
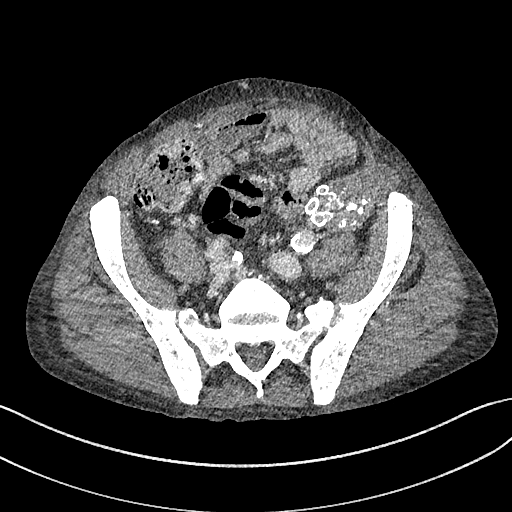
[im 668/1146  soft-tissue]
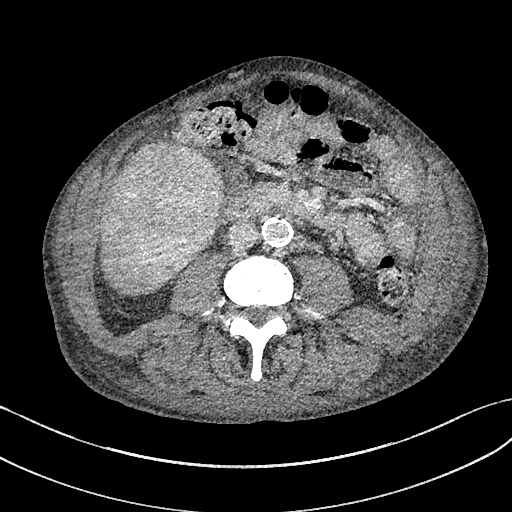
[im 764/1146  soft-tissue]
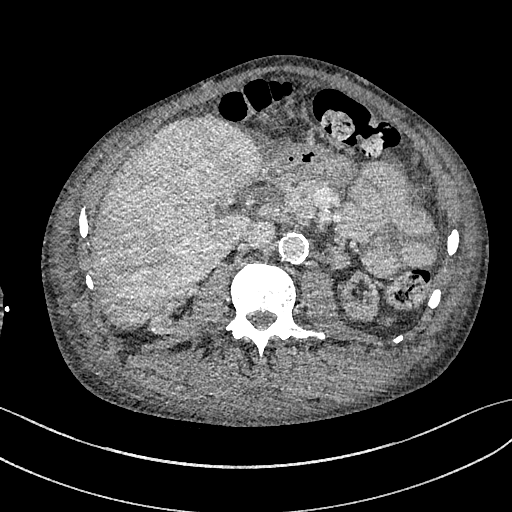
[im 764/1146  lung]
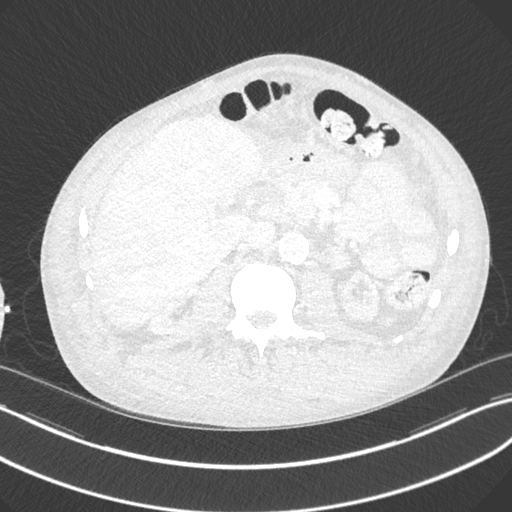
[im 859/1146  soft-tissue]
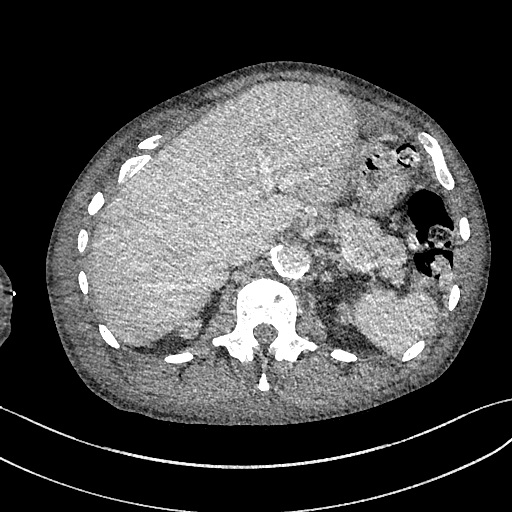
[im 859/1146  lung]
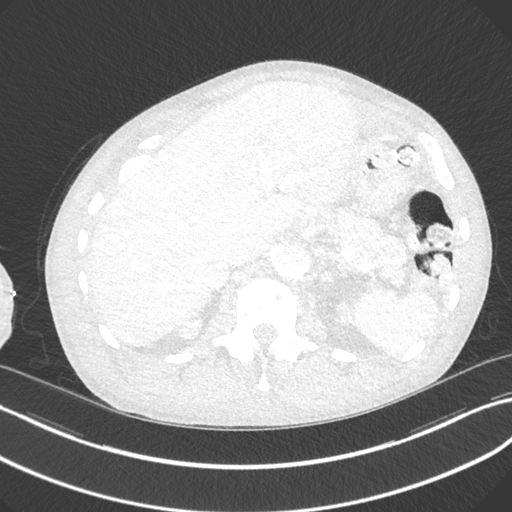
[im 955/1146  lung]
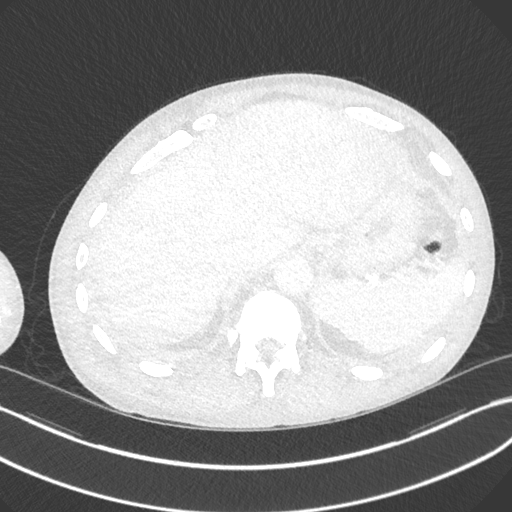
[im 1050/1146  soft-tissue]
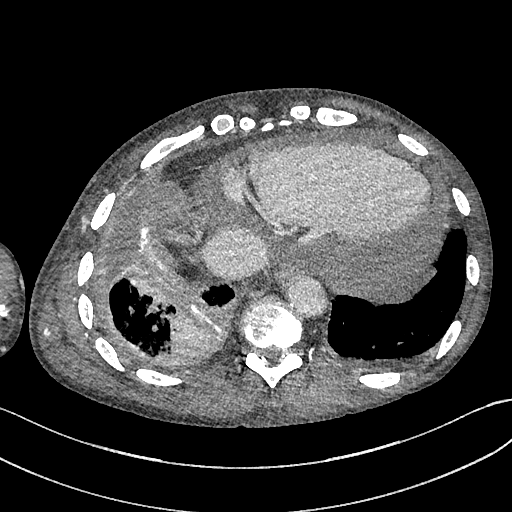
[im 1050/1146  lung]
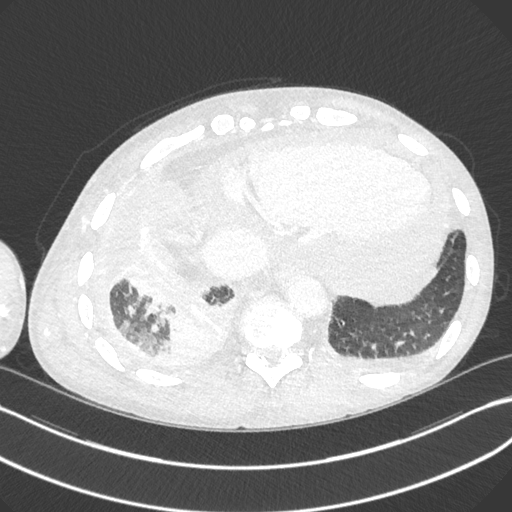

[11 of 46 positions shown; findings below may reference images not displayed]

FINDINGS: VASCULAR

Aorta: Extensive calcific atherosclerosis of the abdominal aorta
without occlusion, aneurysm, or dissection. No acute aortic process.
No retroperitoneal hemorrhage or hematoma.

Celiac: Minor atherosclerotic change of the origin remains widely
patent including its branches.

SMA: Minor atherosclerotic origin but remains patent including its
branches.

Renals: Heavily calcified renal arteries with bilateral chronic
occlusions

IMA: Remains patent off the distal aorta including its branches

Inflow: Heavily calcified and tortuous iliac vasculature without
inflow disease. Right internal iliac artery remains patent. Chronic
occlusion of the left internal iliac artery proximally with the
distal branches reconstituted.

Chronic calcified left iliac fossa renal transplant noted.

Proximal Outflow: The common femoral, profunda femoral, and proximal
superficial femoral arteries demonstrate atherosclerotic changes
bilaterally without occlusion or acute finding.

Veins: Limited venous phase imaging. No acute Adearo process
appreciated.

Review of the MIP images confirms the above findings.

NON-VASCULAR

Lower chest: Cardiomegaly again noted with a moderate pericardial
effusion. Similar areas of masslike rounded consolidation in the
right lower lobe. Small pleural effusions present bilaterally. Right
effusion has a chronic loculated component.

Hepatobiliary: Liver is enlarged with heterogeneous patchy
enhancement pattern suggesting chronic passive congestion of the
liver. Contrast refluxes into the IVC and hepatic veins on the
arterial phase imaging suggesting right heart failure.

No intrahepatic biliary dilatation. Gallbladder nondistended. Common
bile duct also nondilated.

Pancreas: Unremarkable. No pancreatic ductal dilatation or
surrounding inflammatory changes.

Spleen: Normal in size without focal abnormality.

Adrenals/Urinary Tract: Chronic renal atrophy bilaterally.
Indeterminate left renal mass measures up to 2.6 cm as before.
Consider follow-up MRI and/or ultrasound for further evaluation. No
renal obstruction or hydronephrosis. No hydroureter appreciated.
Urinary bladder collapsed. Prostate gland is not enlarged.

Stomach/Bowel: Negative for bowel obstruction, significant
dilatation, ileus, or free air. No significant bowel wall
thickening. No pneumatosis appreciated. Small amount of upper
abdominal mesenteric free fluid about the nondilated small
bowel/proximal jejunum remains nonspecific.

No fluid collection, hemorrhage, hematoma, or abscess.

Lymphatic: Similar prominent retroperitoneal periaortic lymph nodes
without bulky adenopathy.

Reproductive: No significant finding by CT.

Other: Intact abdominal wall.  No hernia.

Musculoskeletal: Chronic sclerotic changes of the visualized
skeleton compatible with renal osteodystrophy. No acute osseous
finding. No compression fracture.
IMPRESSION: VASCULAR

Extensive abdominopelvic atherosclerosis as above but there is
preserved patency of the mesenteric vasculature.

Chronic bilateral renal artery occlusions

Reflux of contrast into the hepatic IVC and hepatic veins compatible
with chronic heart failure

NON-VASCULAR

Cardiomegaly and pericardial effusion as before

Chronic masslike consolidation in the right lower lobe with a
chronic small loculated right effusion. Trace left effusion.

Hepatomegaly with heterogeneous patchy contrast enhancement, suspect
passive congestion of the liver from chronic heart failure.

Indeterminate 2.6 cm left renal mass consider follow-up MRI or
ultrasound.

Chronic calcified left renal fossa transplant kidney.

Nonspecific small amount of free fluid in the mesentery of the left
abdomen about the proximal small bowel. Difficult to exclude mild
enteritis. No significant bowel wall thickening or pneumatosis.

No free air.

## 2022-08-27 IMAGING — DX DG CHEST 1V PORT
1 series · 1 of 1 positions shown · non-contrast
Comparison: 03/09/2020

CLINICAL DATA: Abdominal pain with nausea

EXAM:
PORTABLE CHEST 1 VIEW

[chest ap]
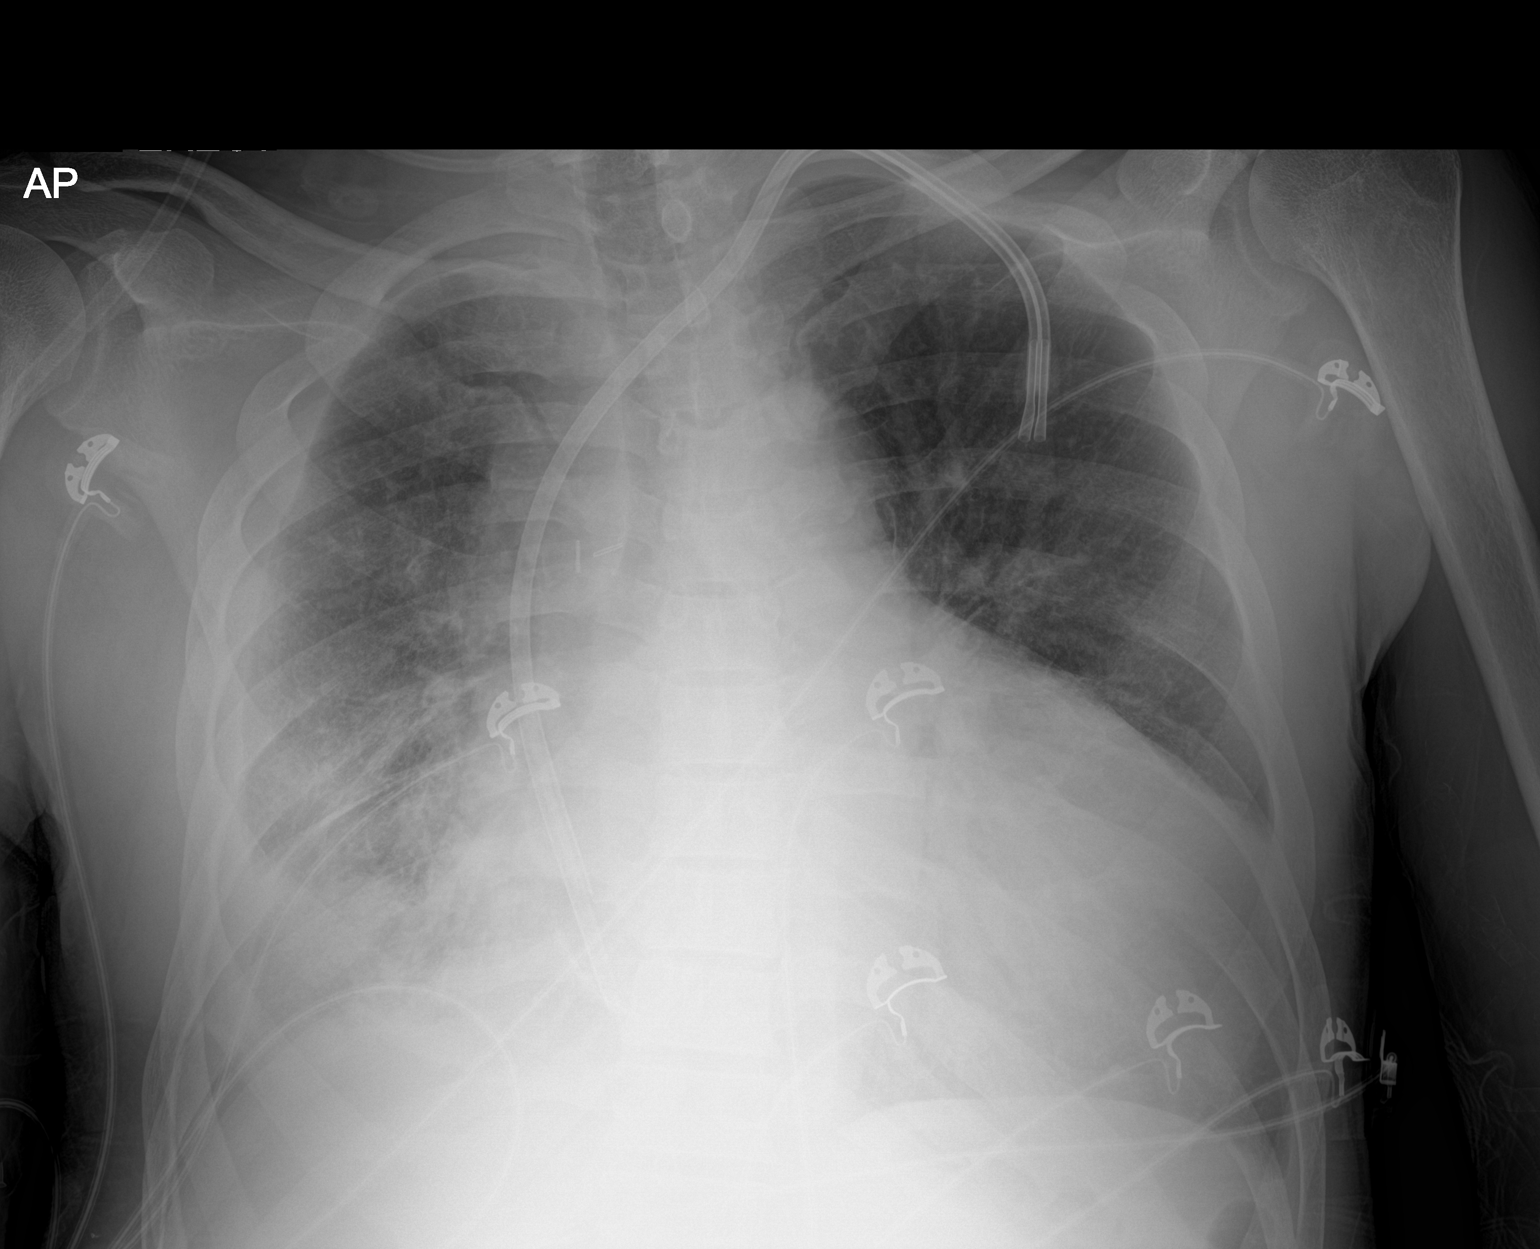

[1 of 1 positions shown; findings below may reference images not displayed]

FINDINGS: Cardiopericardial enlargement. Diffuse interstitial opacity with
small right pleural effusion which is chronic and associated with
pleural thickening. There is asymmetric right lower lobe opacity
with volume loss with a round atelectasis appearance by recent chest
CT. Dialysis catheter from the left with tips at the right atrium.
IMPRESSION: 1. Stable compared to 03/09/2020.
2. Cardiopericardial enlargement with large pericardial effusion on
recent chest CT.
3. Fibrothorax and round atelectasis on the right.

## 2022-09-23 IMAGING — DX DG CHEST 1V PORT
2 series · 2 of 2 positions shown · non-contrast
Comparison: 04/07/2020, CT from 03/09/2020

CLINICAL DATA: Shortness of breath

EXAM:
PORTABLE CHEST 1 VIEW

[chest ap (1 of 2)]
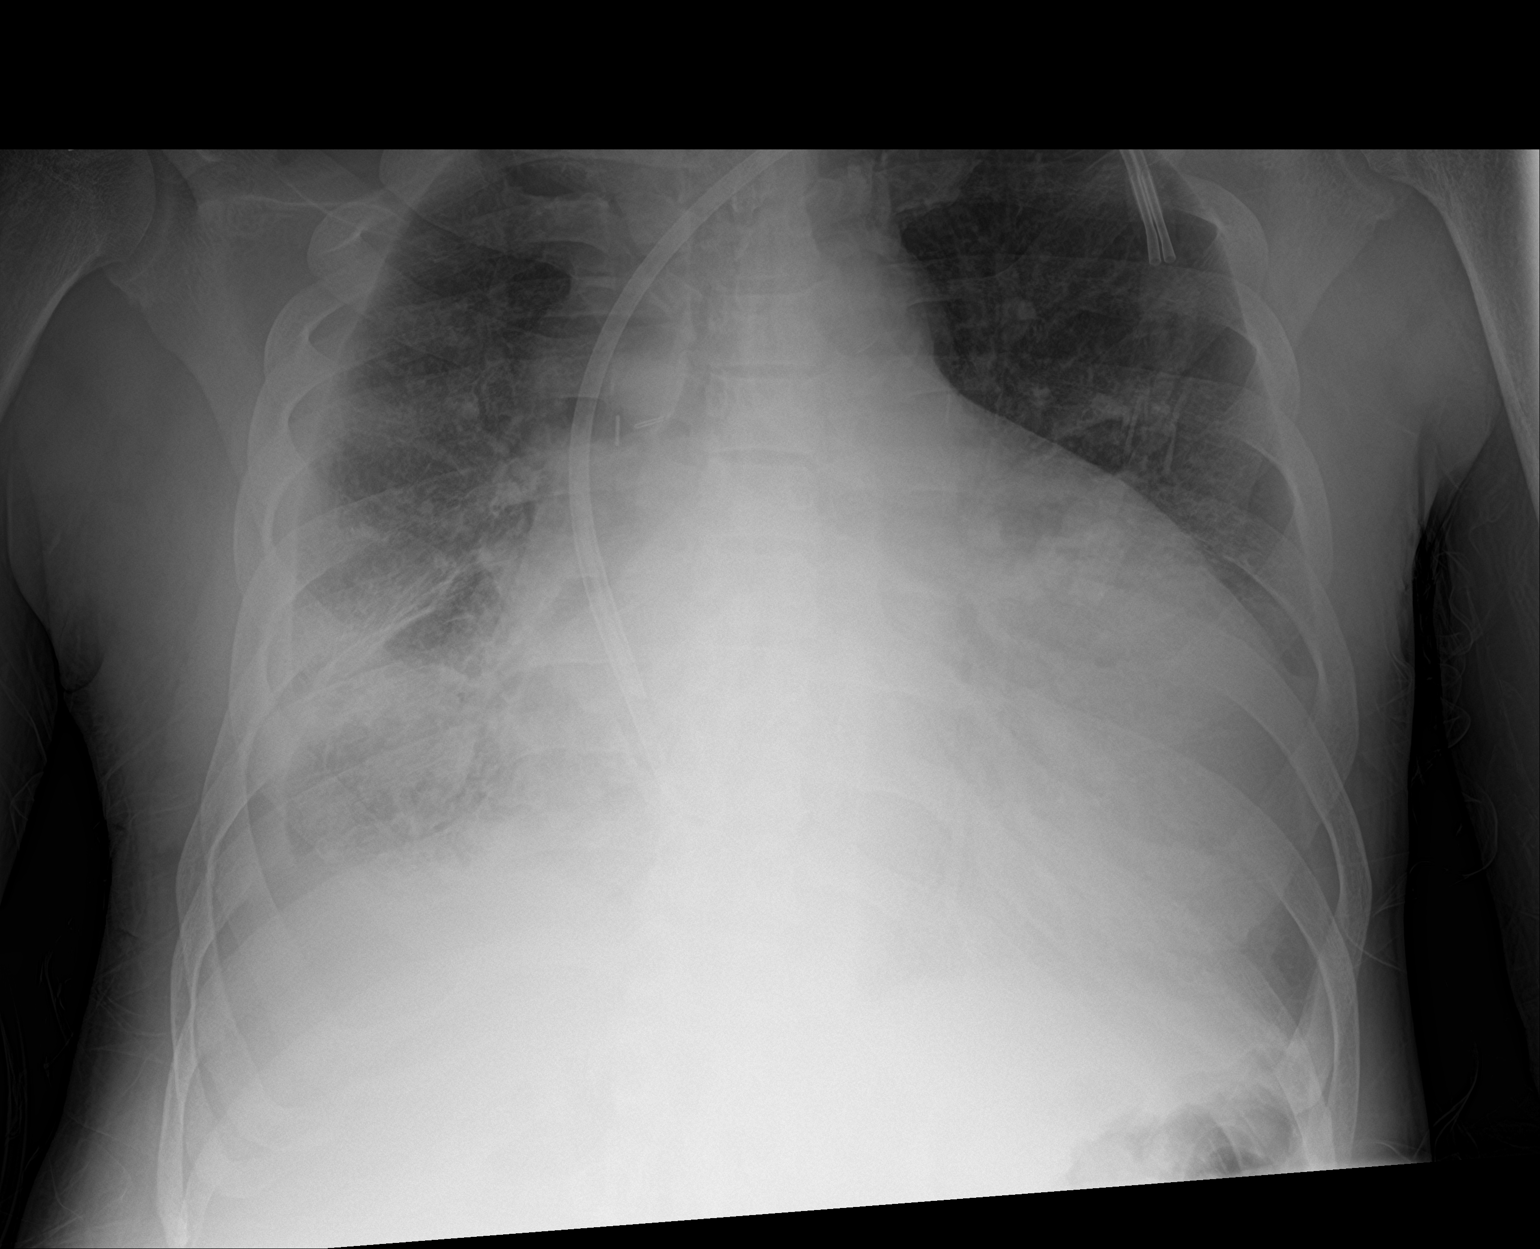

[chest ap (2 of 2)]
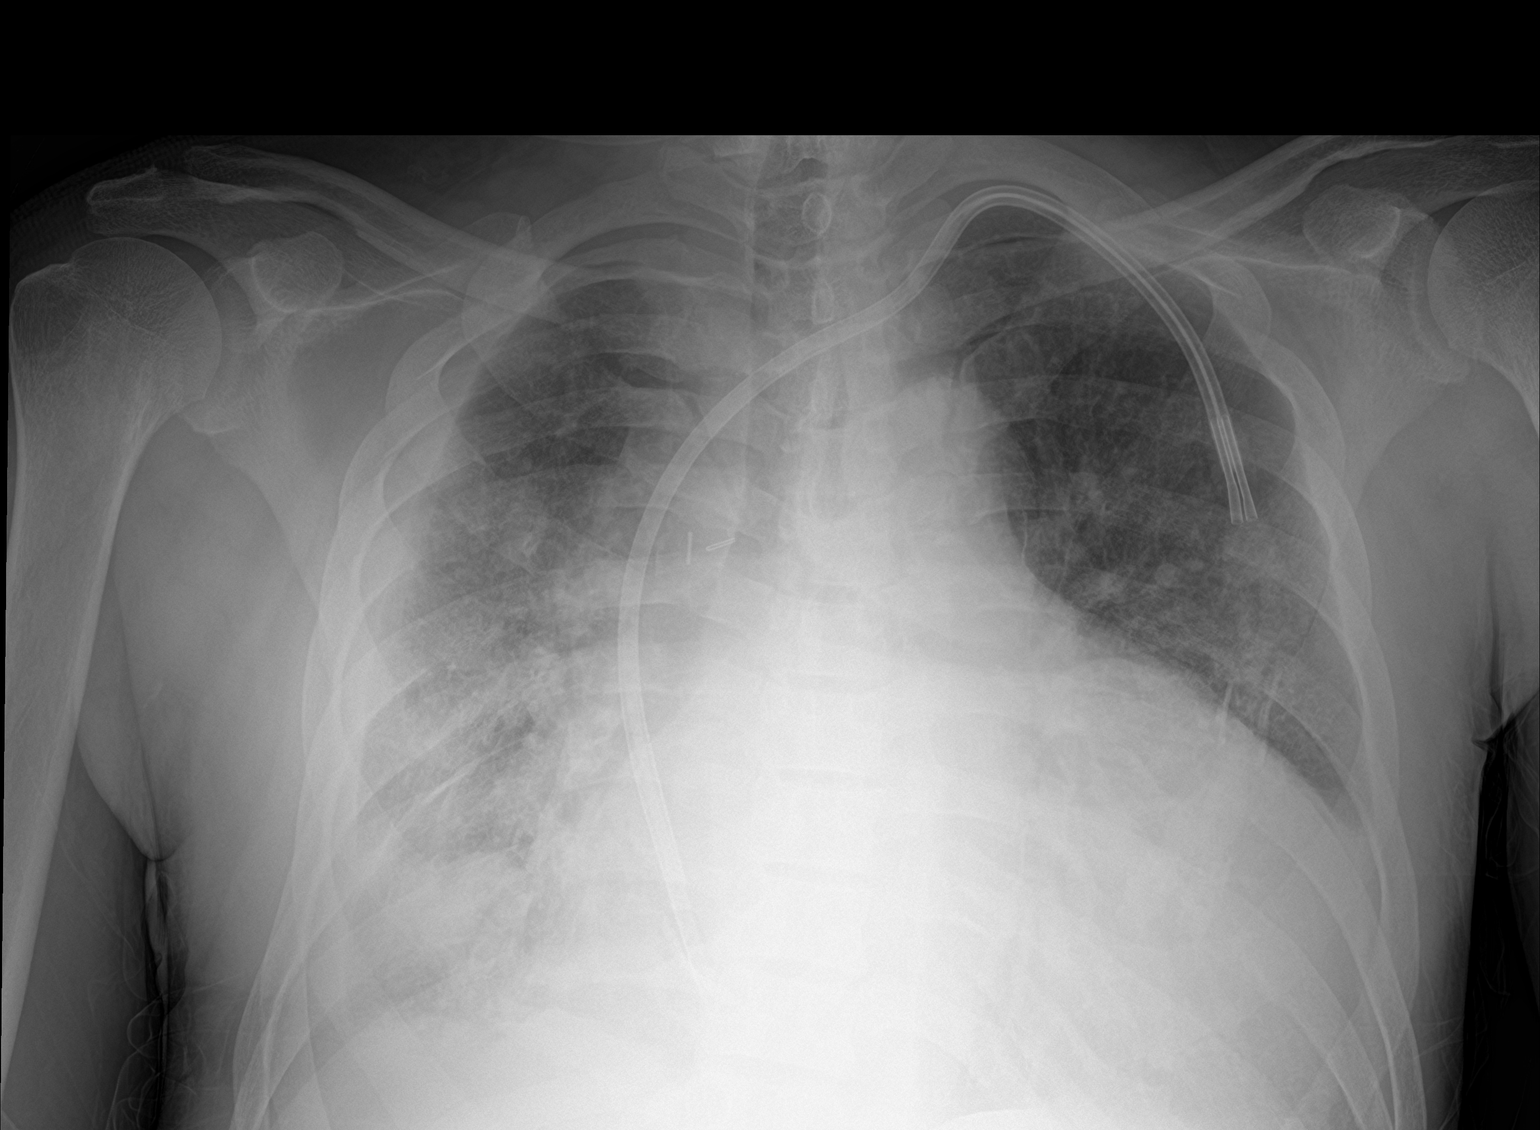

[2 of 2 positions shown; findings below may reference images not displayed]

FINDINGS: Cardiac shadow is enlarged but stable. Dialysis catheter is noted on
the left and stable. Diffuse pleural thickening is noted on the
right similar to that seen on prior CT representing pleural
thickening and a small effusion particularly at the base. Rounded
density is noted right lower lobe stable in appearance from previous
exams. Left lung remains clear. No bony abnormality is seen.
IMPRESSION: Chronic changes in the right hemithorax stable from the previous
exams.

Stable cardiomegaly in part due to large pericardial effusion.

No new focal infiltrate is seen.

## 2022-09-26 IMAGING — DX DG CHEST 2V
2 series · 2 of 2 positions shown · non-contrast
Comparison: Chest radiograph dated 05/04/2020.

CLINICAL DATA: 54-year-old male with chest pain.

EXAM:
CHEST - 2 VIEW

[chest lat]
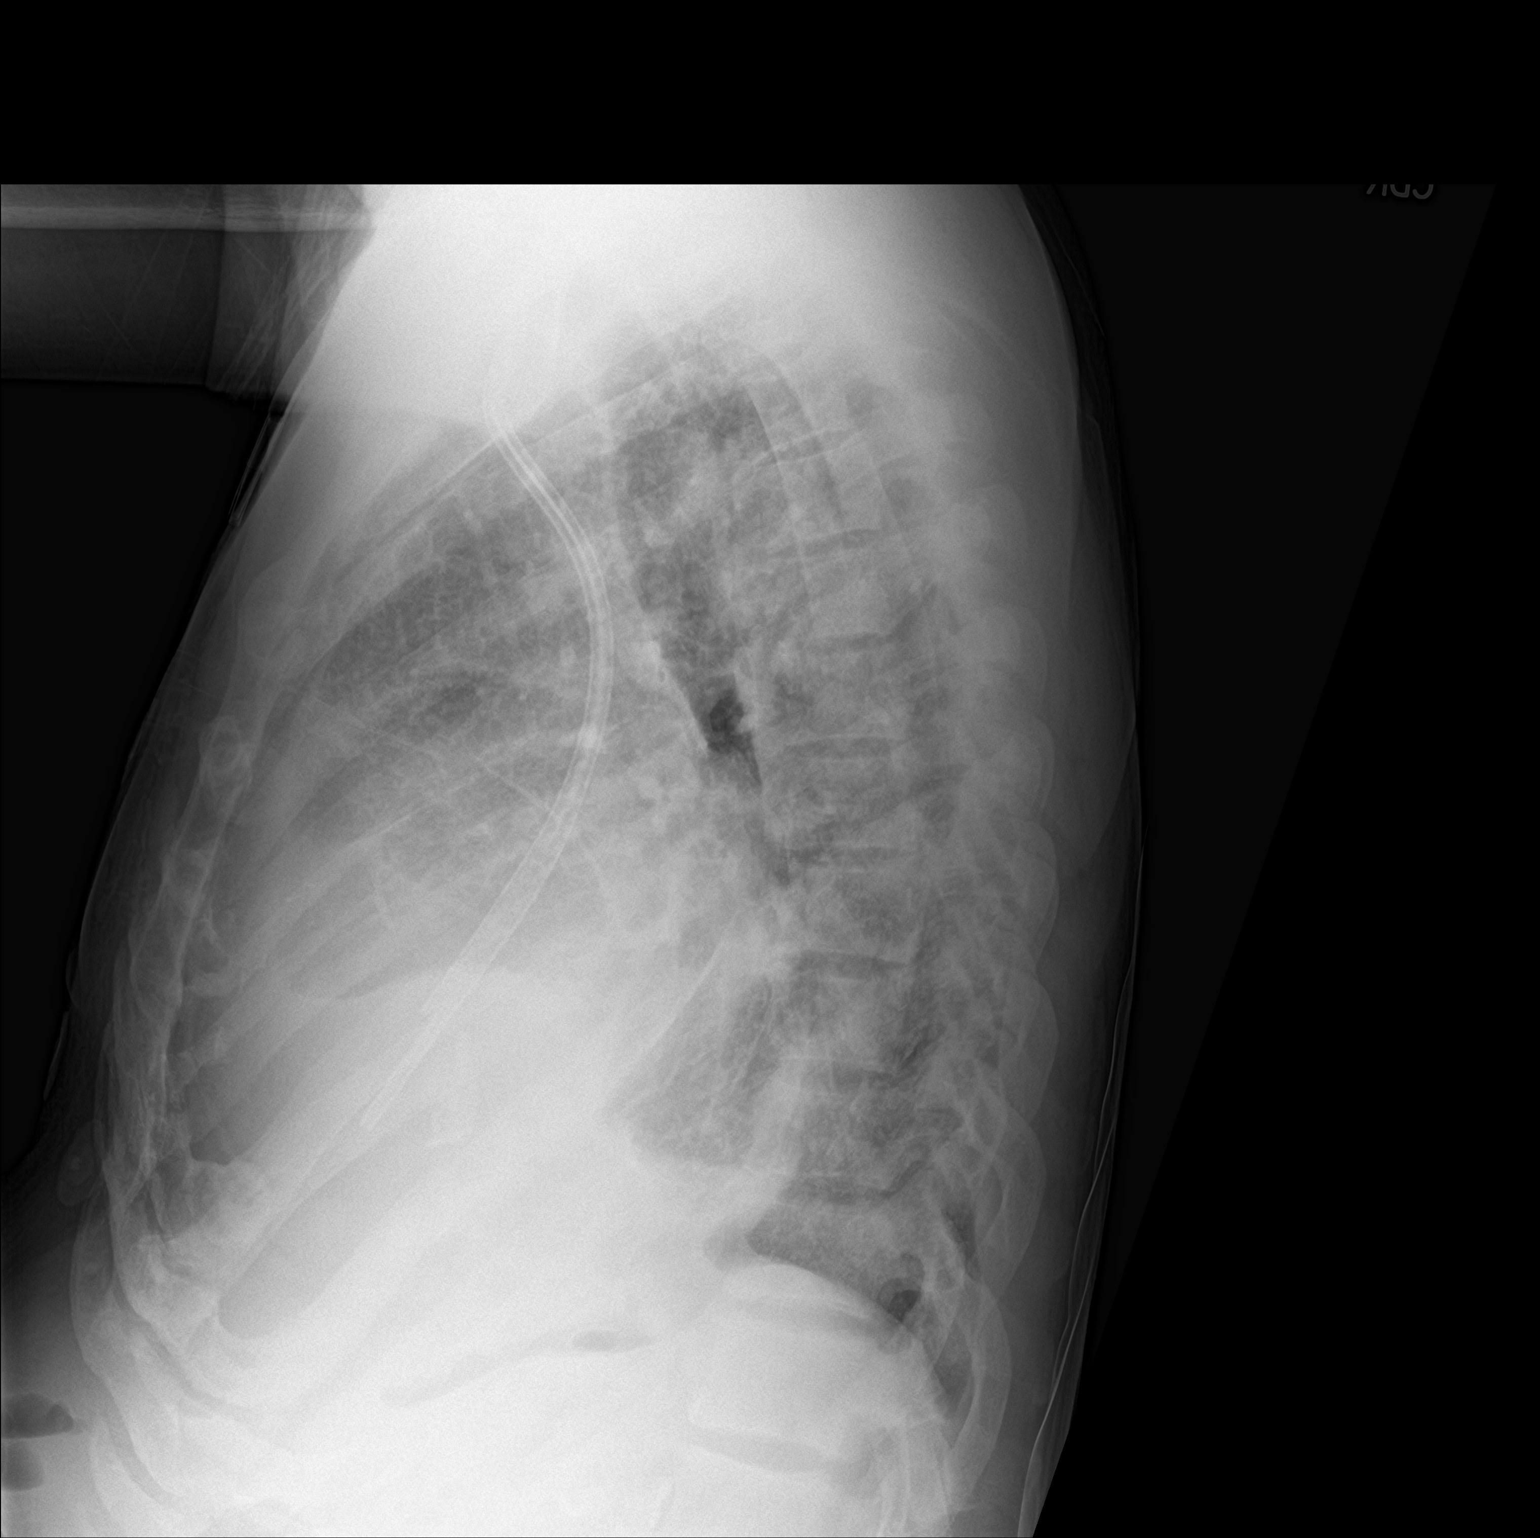

[chest ap]
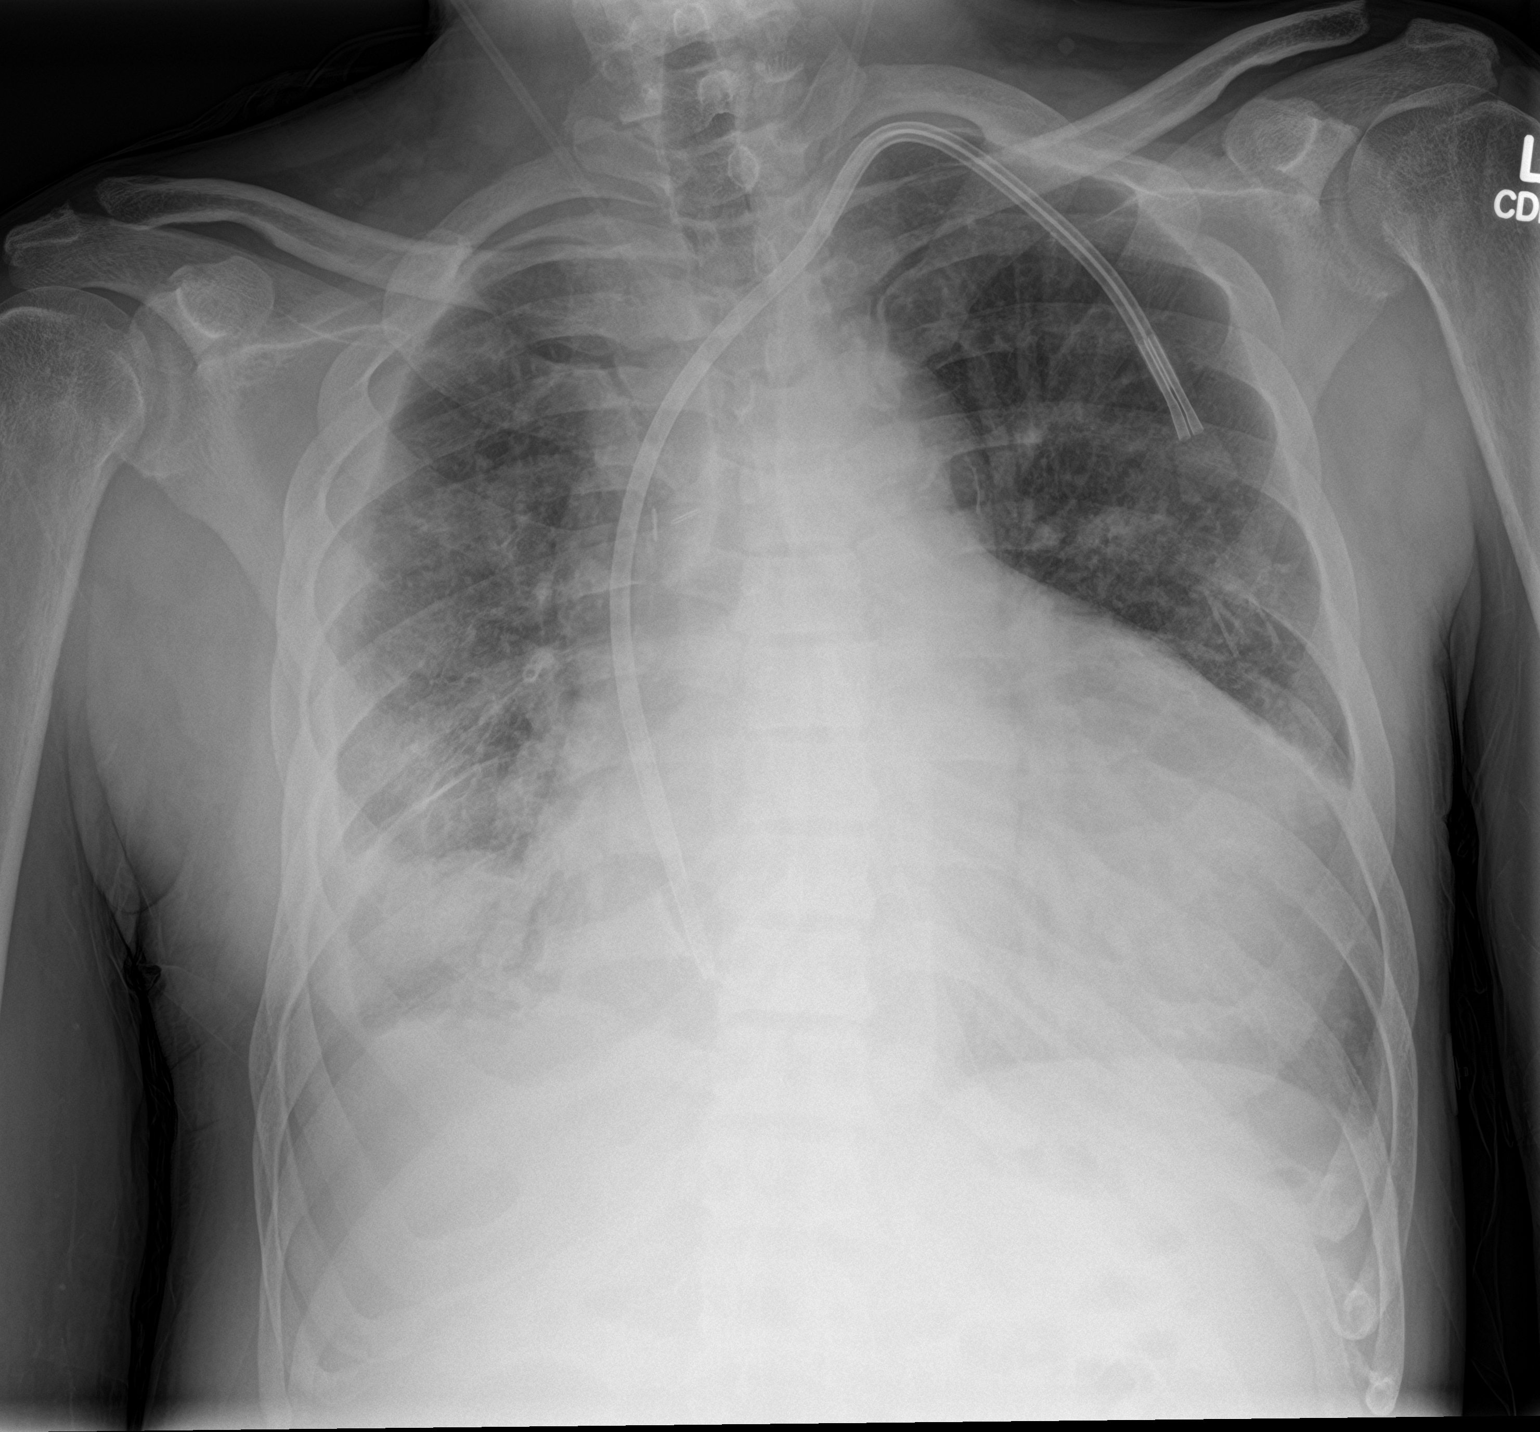

[2 of 2 positions shown; findings below may reference images not displayed]

FINDINGS: Dialysis catheter in similar position. Stable moderate cardiomegaly
with vascular congestion and edema. Superimposed pneumonia is not
excluded. Clinical correlation is recommended. Small right pleural
effusion with no significant interval change. No pneumothorax. No
acute osseous pathology.
IMPRESSION: 1. Stable cardiomegaly with findings of CHF or fluid overload.
Superimposed pneumonia is not excluded.
2. Small right pleural effusion.

## 2022-10-03 IMAGING — CT CT RENAL STONE PROTOCOL
2 of 4 series · 16 of 46 positions shown, 18 images · non-contrast
Comparison: 03/11/2020

CLINICAL DATA: Flank pain.  Laterality is not indicated.

EXAM:
CT ABDOMEN AND PELVIS WITHOUT CONTRAST
TECHNIQUE: Multidetector CT imaging of the abdomen and pelvis was performed
following the standard protocol without IV contrast.

[Series 3: stone study 5.0 i30f 2 · axial · 0.81mm/px · z∈[+768,+1218]mm · 13 of 100 slices shown, 15 images]
[im 5/100  soft-tissue]
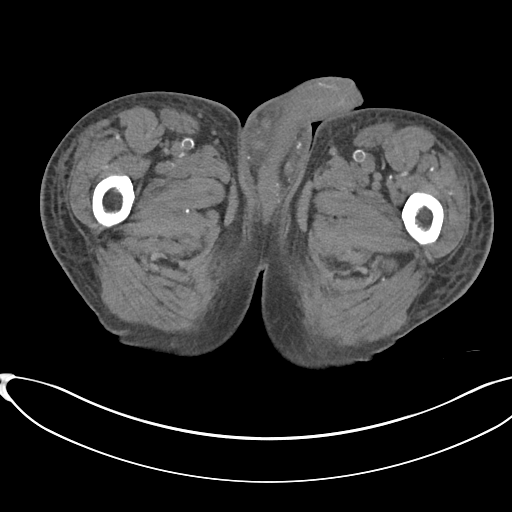
[im 5/100  bone]
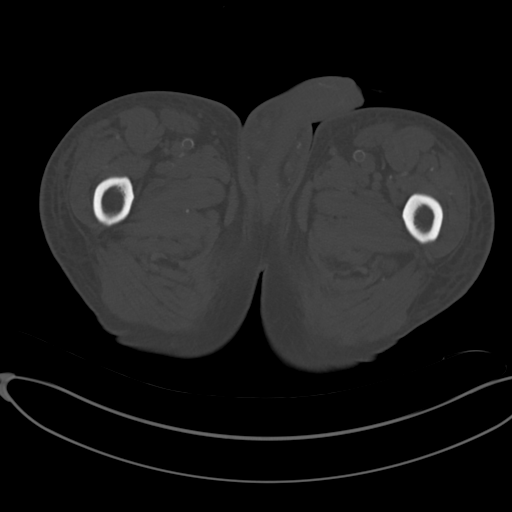
[im 13/100  soft-tissue]
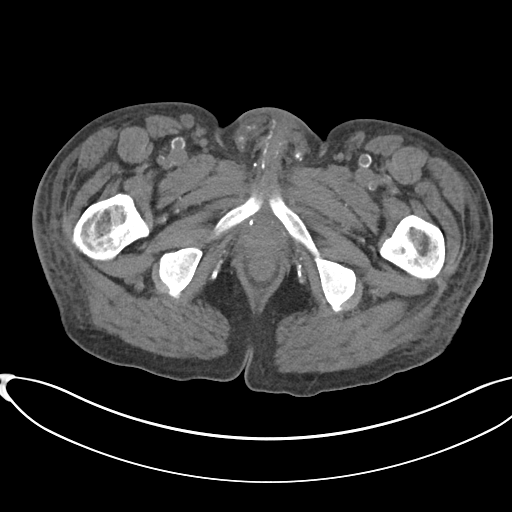
[im 21/100  soft-tissue]
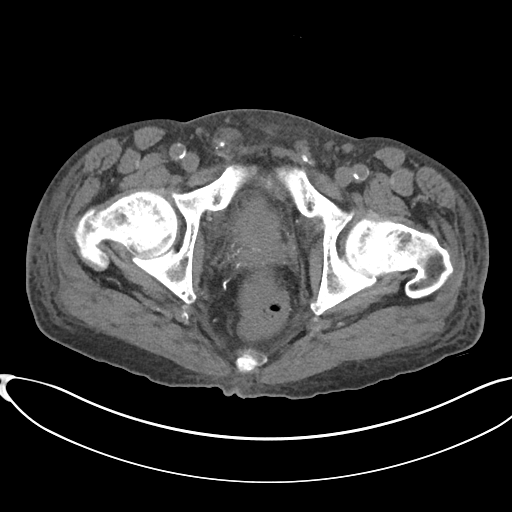
[im 29/100  soft-tissue]
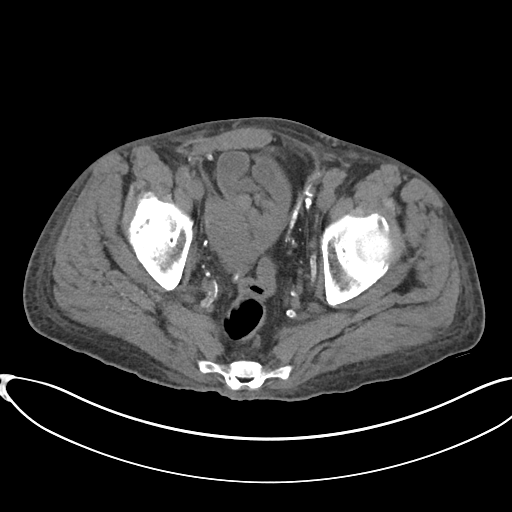
[im 34/100  soft-tissue]
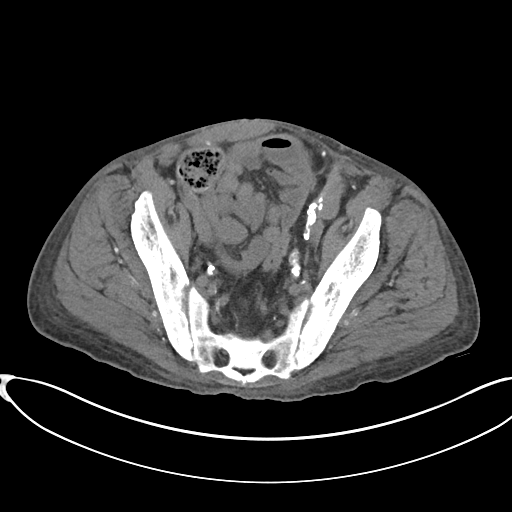
[im 42/100  soft-tissue]
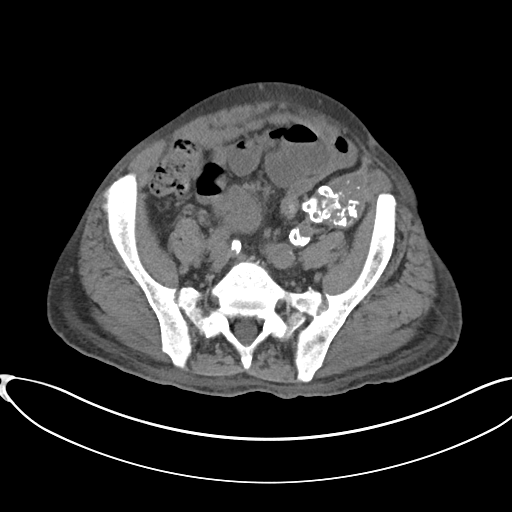
[im 50/100  soft-tissue]
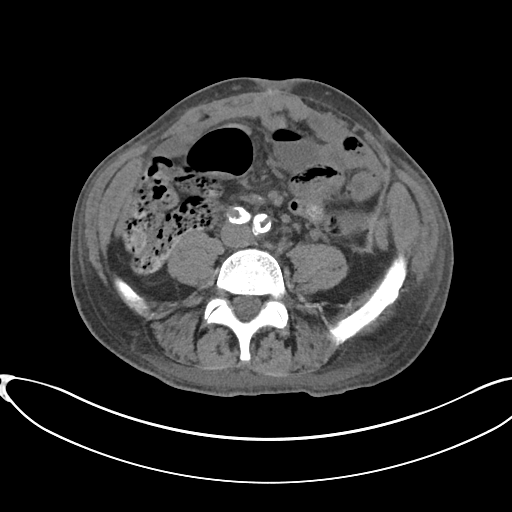
[im 58/100  soft-tissue]
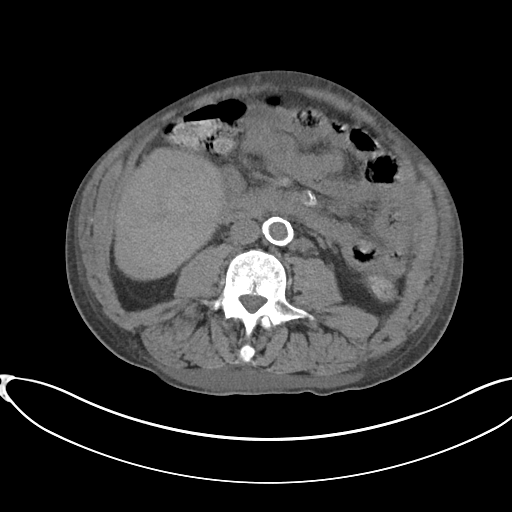
[im 67/100  soft-tissue]
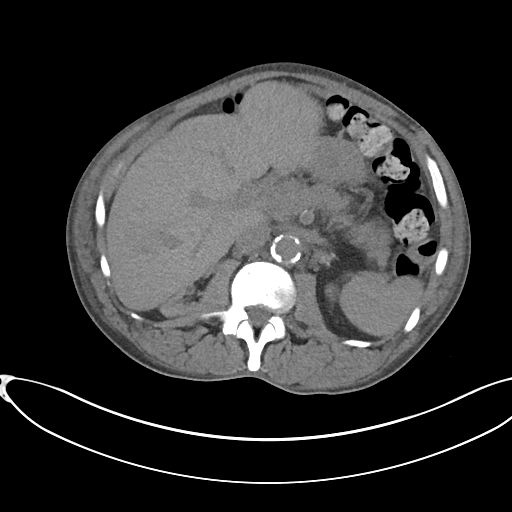
[im 67/100  bone]
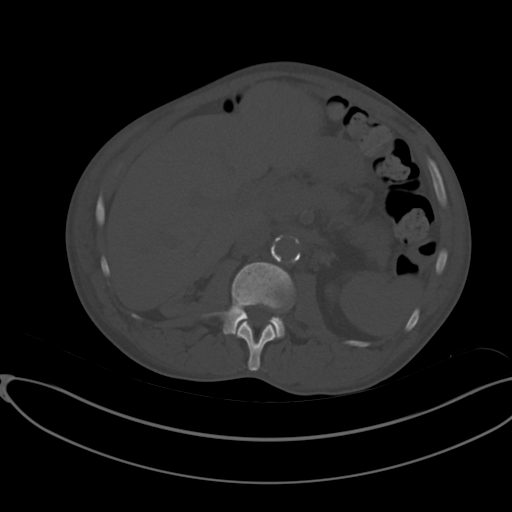
[im 71/100  soft-tissue]
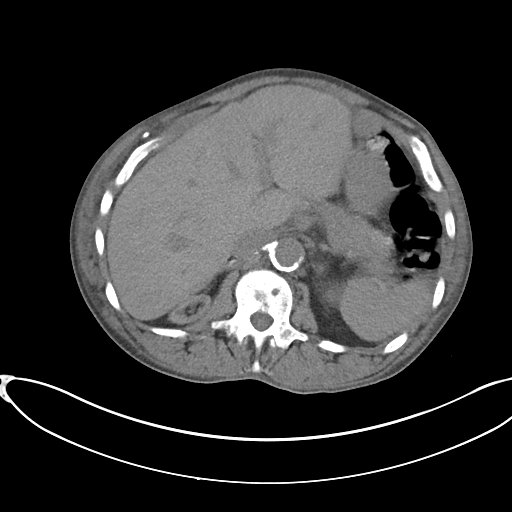
[im 79/100  soft-tissue]
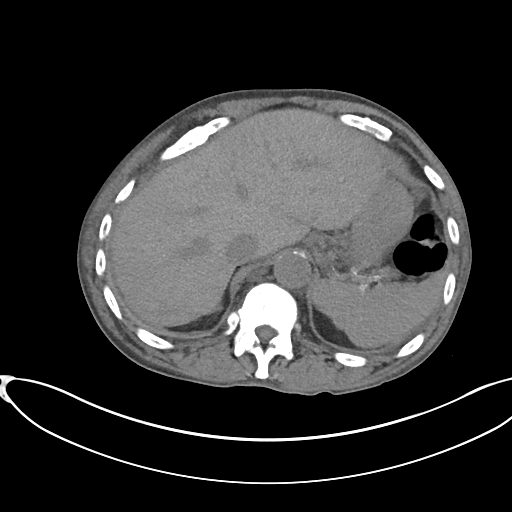
[im 87/100  soft-tissue]
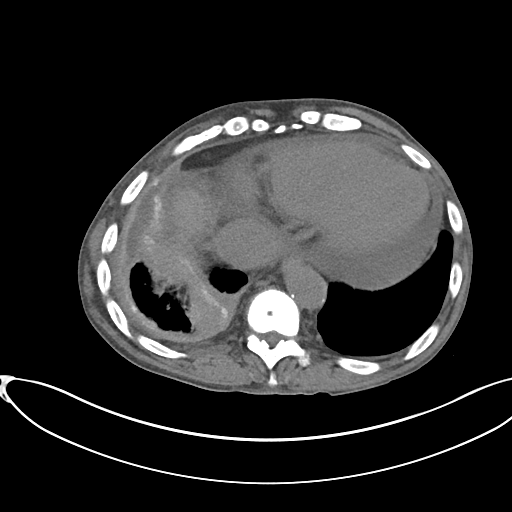
[im 95/100  soft-tissue]
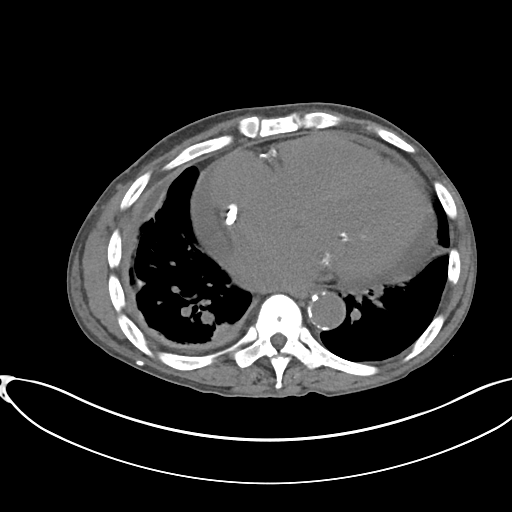

[Series 6: coronal soft tissue · coronal · 0.83mm/px · 3 of 99 slices shown]
[im 33/99  soft-tissue]
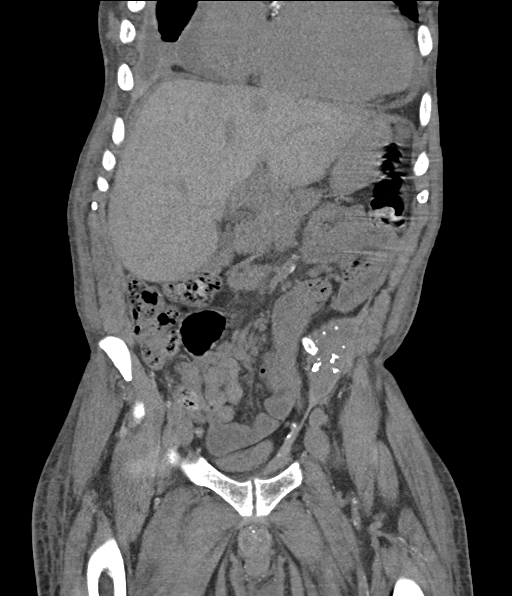
[im 44/99  soft-tissue]
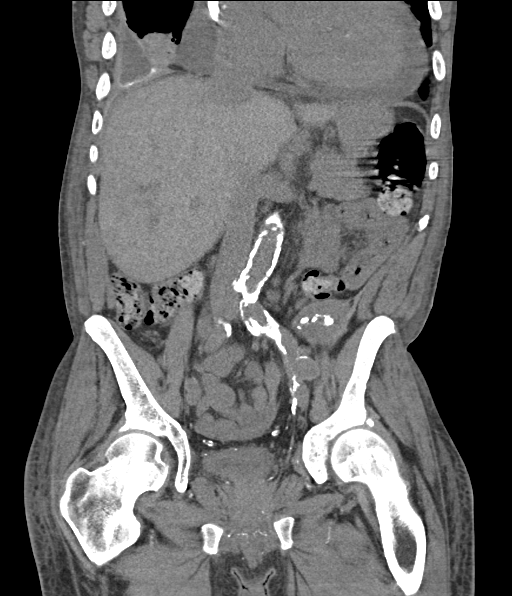
[im 55/99  soft-tissue]
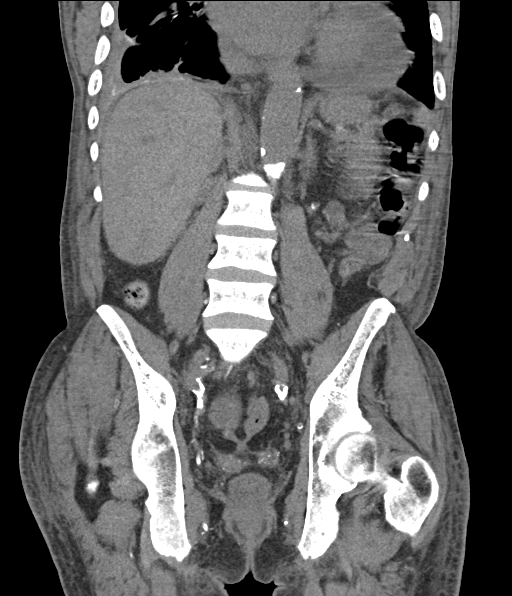

[16 of 46 positions shown; findings below may reference images not displayed]

FINDINGS: Lower chest: Small right pleural effusion with basilar atelectasis.
Consolidation or mass in the right lung base. Probable loculated
effusion anteriorly. Appearances are similar to previous study.
Diffuse cardiac enlargement with moderate pericardial effusion is
also unchanged.

Hepatobiliary: No focal liver abnormality is seen. No gallstones,
gallbladder wall thickening, or biliary dilatation.

Pancreas: Unremarkable. No pancreatic ductal dilatation or
surrounding inflammatory changes.

Spleen: Normal in size without focal abnormality.

Adrenals/Urinary Tract: No adrenal gland nodules. Kidneys are
markedly atrophic bilaterally. Mass on the left kidney measuring
cm diameter. This appears to represent a solid mass and was present
on the previous study. No hydronephrosis or hydroureter. Bladder is
decompressed. Calcified structure in the left pelvis likely
represents a calcified transplant kidney. This is also unchanged.

Stomach/Bowel: Stomach, small bowel, and colon are not abnormally
distended. Scattered stool in the colon. No inflammatory changes are
appreciated.

Vascular/Lymphatic: Extensive vascular calcifications.  No aneurysm.

Reproductive: Prostate is unremarkable.

Other: No free air or free fluid in the abdomen. Abdominal wall
musculature appears intact. Edema in the subcutaneous fat.

Musculoskeletal: Diffuse bone sclerosis likely representing renal
osteodystrophy.
IMPRESSION: 1. No acute process demonstrated in the abdomen or pelvis. No
evidence of bowel obstruction or inflammation. Unchanged appearance
since previous study.
2. Small right pleural effusion with basilar atelectasis.
Consolidation or mass in the right lung base. Probable loculated
effusion anteriorly. Diffuse cardiac enlargement with moderate
pericardial effusion is also unchanged.
3. Mass on the left kidney measuring 2.5 cm diameter. This appears
to represent a solid mass and was present on the previous study. MRI
follow-up was previously recommended.
4. Extensive vascular calcifications.
5. Diffuse bone sclerosis likely representing renal osteodystrophy.
6. Bilateral renal atrophy. No hydronephrosis. Calcified structure
in the left pelvis likely represents a calcified transplant kidney.
This is also unchanged.

Aortic Atherosclerosis ([id number]).

## 2022-10-03 IMAGING — CT CT L SPINE W/O CM
3 series · 14 of 33 positions shown, 17 images · non-contrast
Comparison: None.

CLINICAL DATA: Low back pain

EXAM:
CT LUMBAR SPINE WITHOUT CONTRAST
TECHNIQUE: Multidetector CT imaging of the lumbar spine was performed without
intravenous contrast administration. Multiplanar CT image
reconstructions were also generated.

[Series 1: lspine · axial · 0.41mm/px · z∈[+946,+1166]mm · 6 of 145 slices shown, 8 images (1 of 3)]
[im 23/145  soft-tissue]
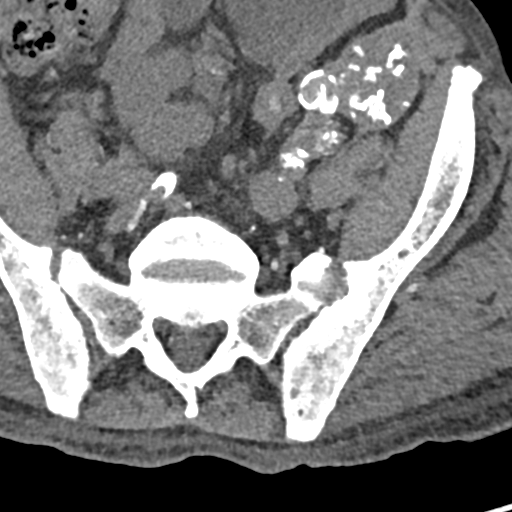
[im 23/145  bone]
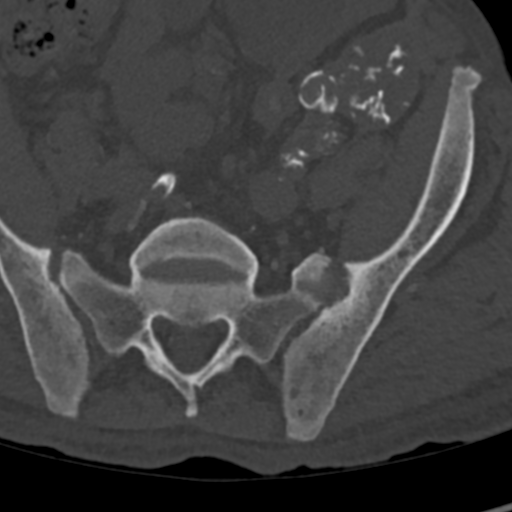
[im 45/145  bone]
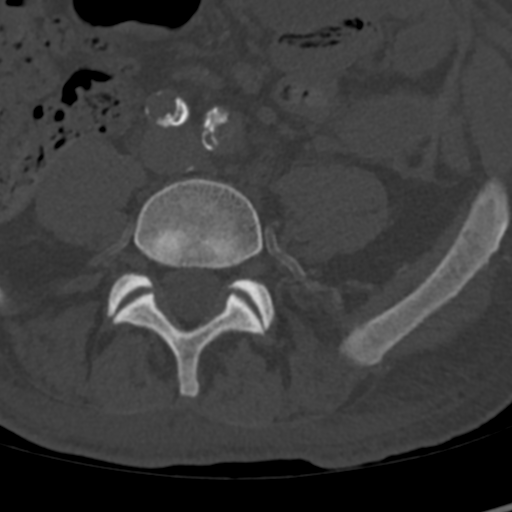
[im 67/145  bone]
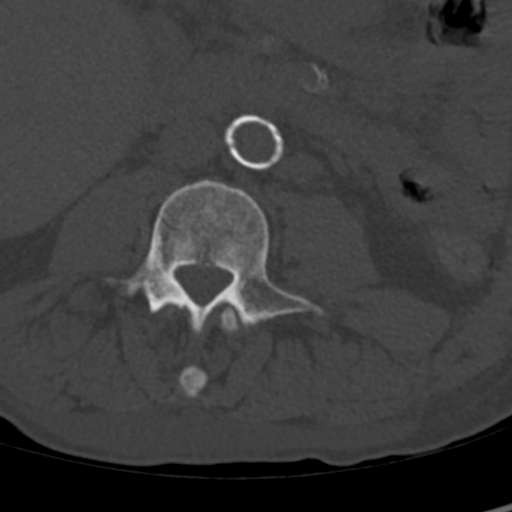
[im 89/145  bone]
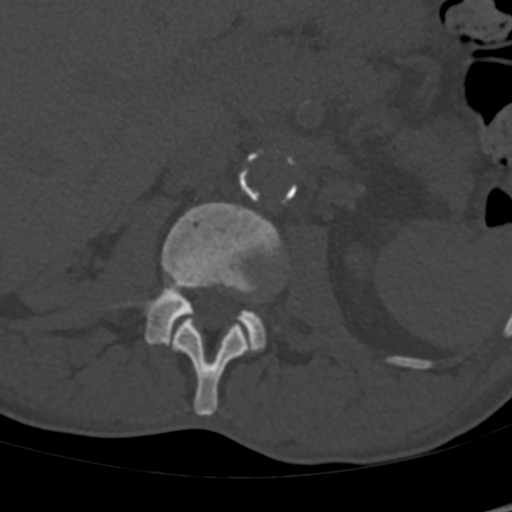
[im 111/145  soft-tissue]
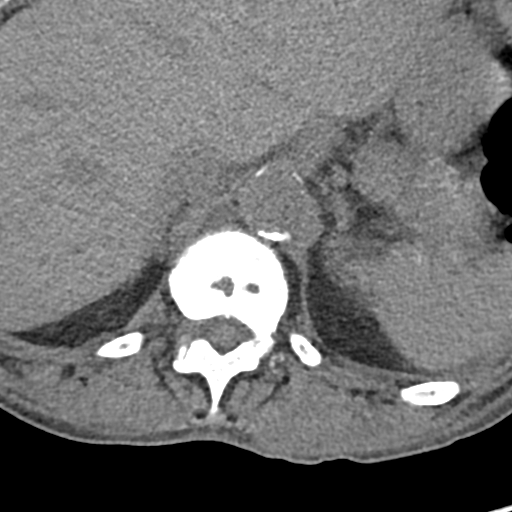
[im 111/145  bone]
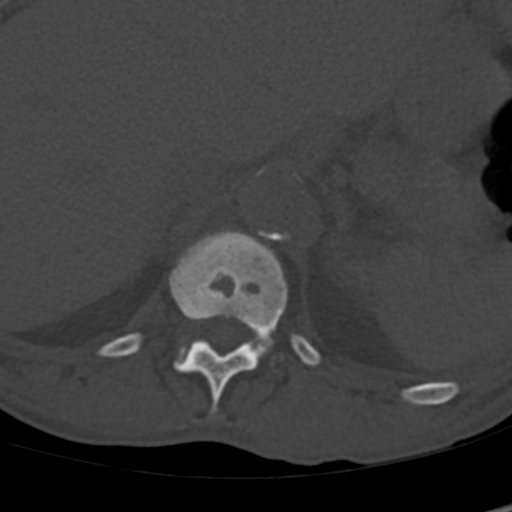
[im 133/145  bone]
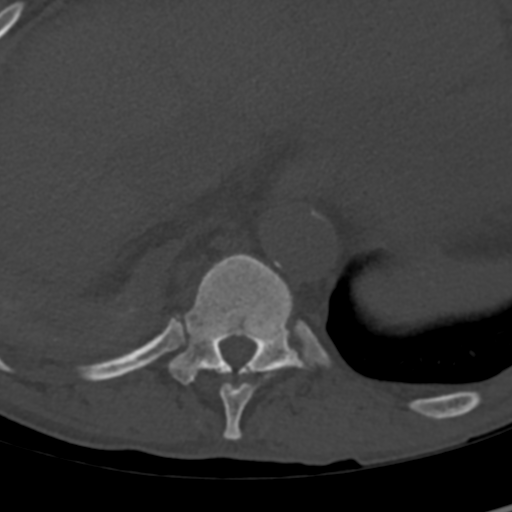

[Series 5: lspine · coronal · 0.41mm/px · 3 of 106 slices shown (2 of 3)]
[im 22/106  bone]
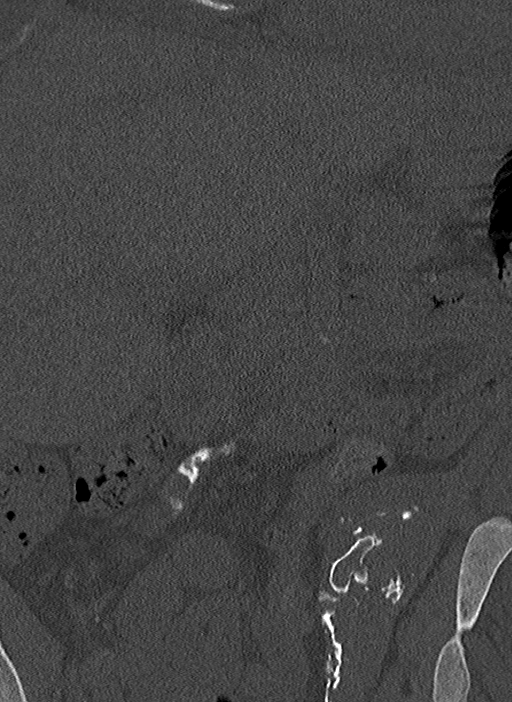
[im 43/106  bone]
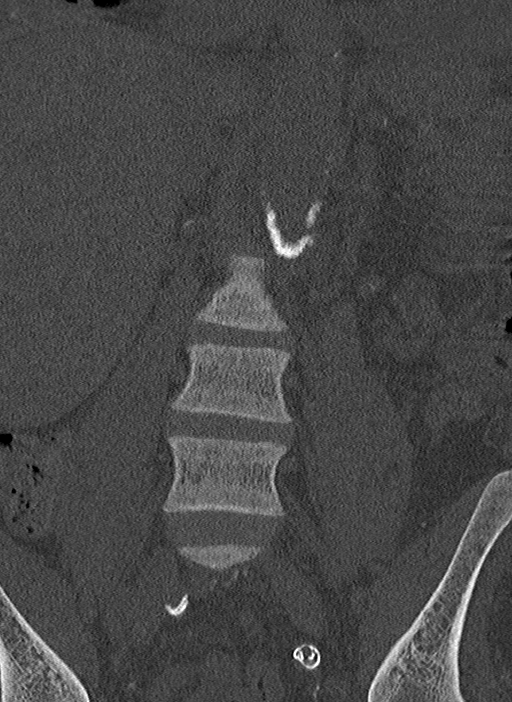
[im 64/106  bone]
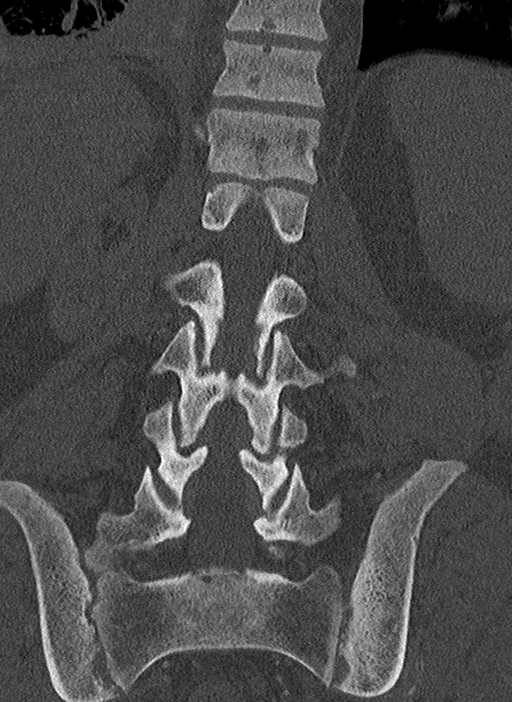

[Series 6: lspine · sagittal · 0.44mm/px · 5 of 114 slices shown, 6 images (3 of 3)]
[im 38/114  bone]
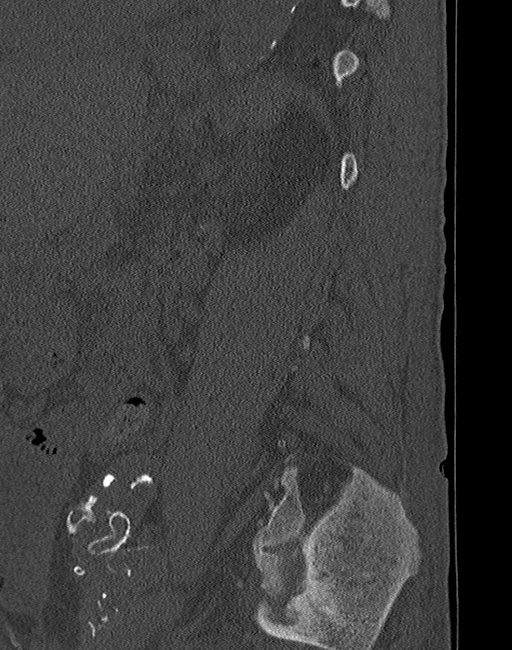
[im 48/114  bone]
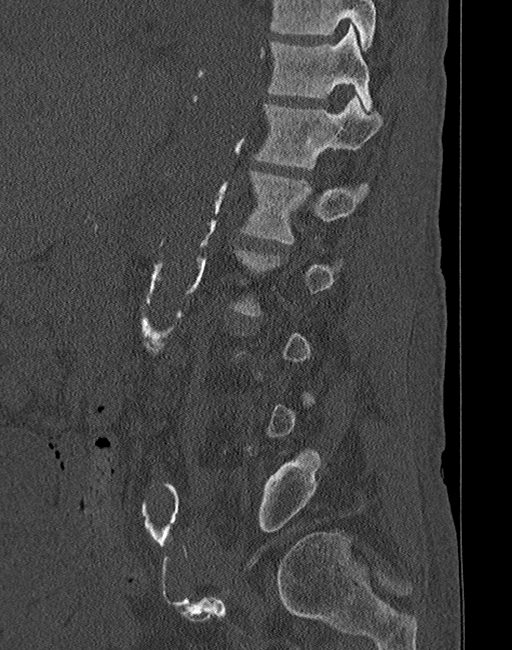
[im 57/114  soft-tissue]
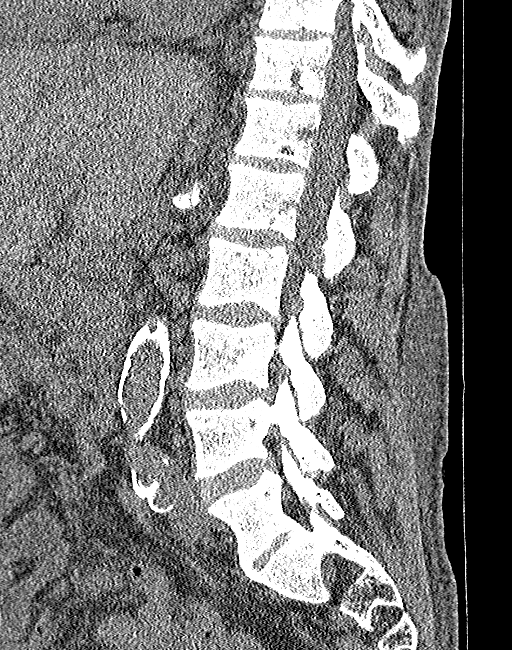
[im 57/114  bone]
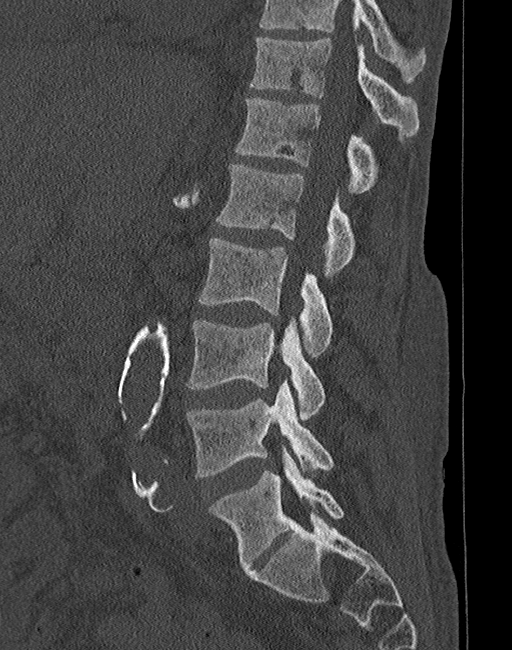
[im 66/114  bone]
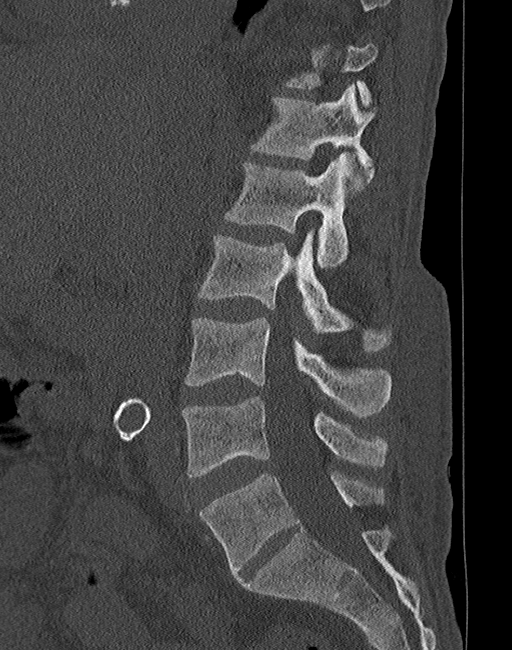
[im 76/114  bone]
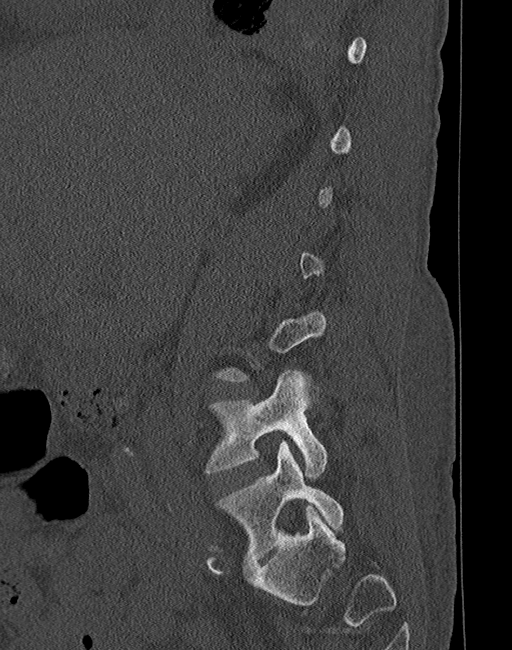

[14 of 33 positions shown; findings below may reference images not displayed]

FINDINGS: Segmentation: Transitional anatomy with partial sacralization of L5
with bilateral assimilation joints.

Alignment: Normal

Vertebrae: Findings of renal osteodystrophy.

Paraspinal and other soft tissues: Please refer to CT of the abdomen
and pelvis performed concomitantly

Disc levels: There is no spinal canal or neural foraminal stenosis.
IMPRESSION: 1. Transitional anatomy with partial sacralization of L5 with
bilateral assimilation joints.
2. Findings of renal osteodystrophy.
3. No spinal canal or neural foraminal stenosis.

## 2022-10-27 IMAGING — DX DG CHEST 1V PORT
1 series · 1 of 1 positions shown · non-contrast
Comparison: Radiograph 05/22/2020 CT 03/09/2020

CLINICAL DATA: Cough body aches, vomiting, dialysis patient

EXAM:
PORTABLE CHEST 1 VIEW

[chest ap]
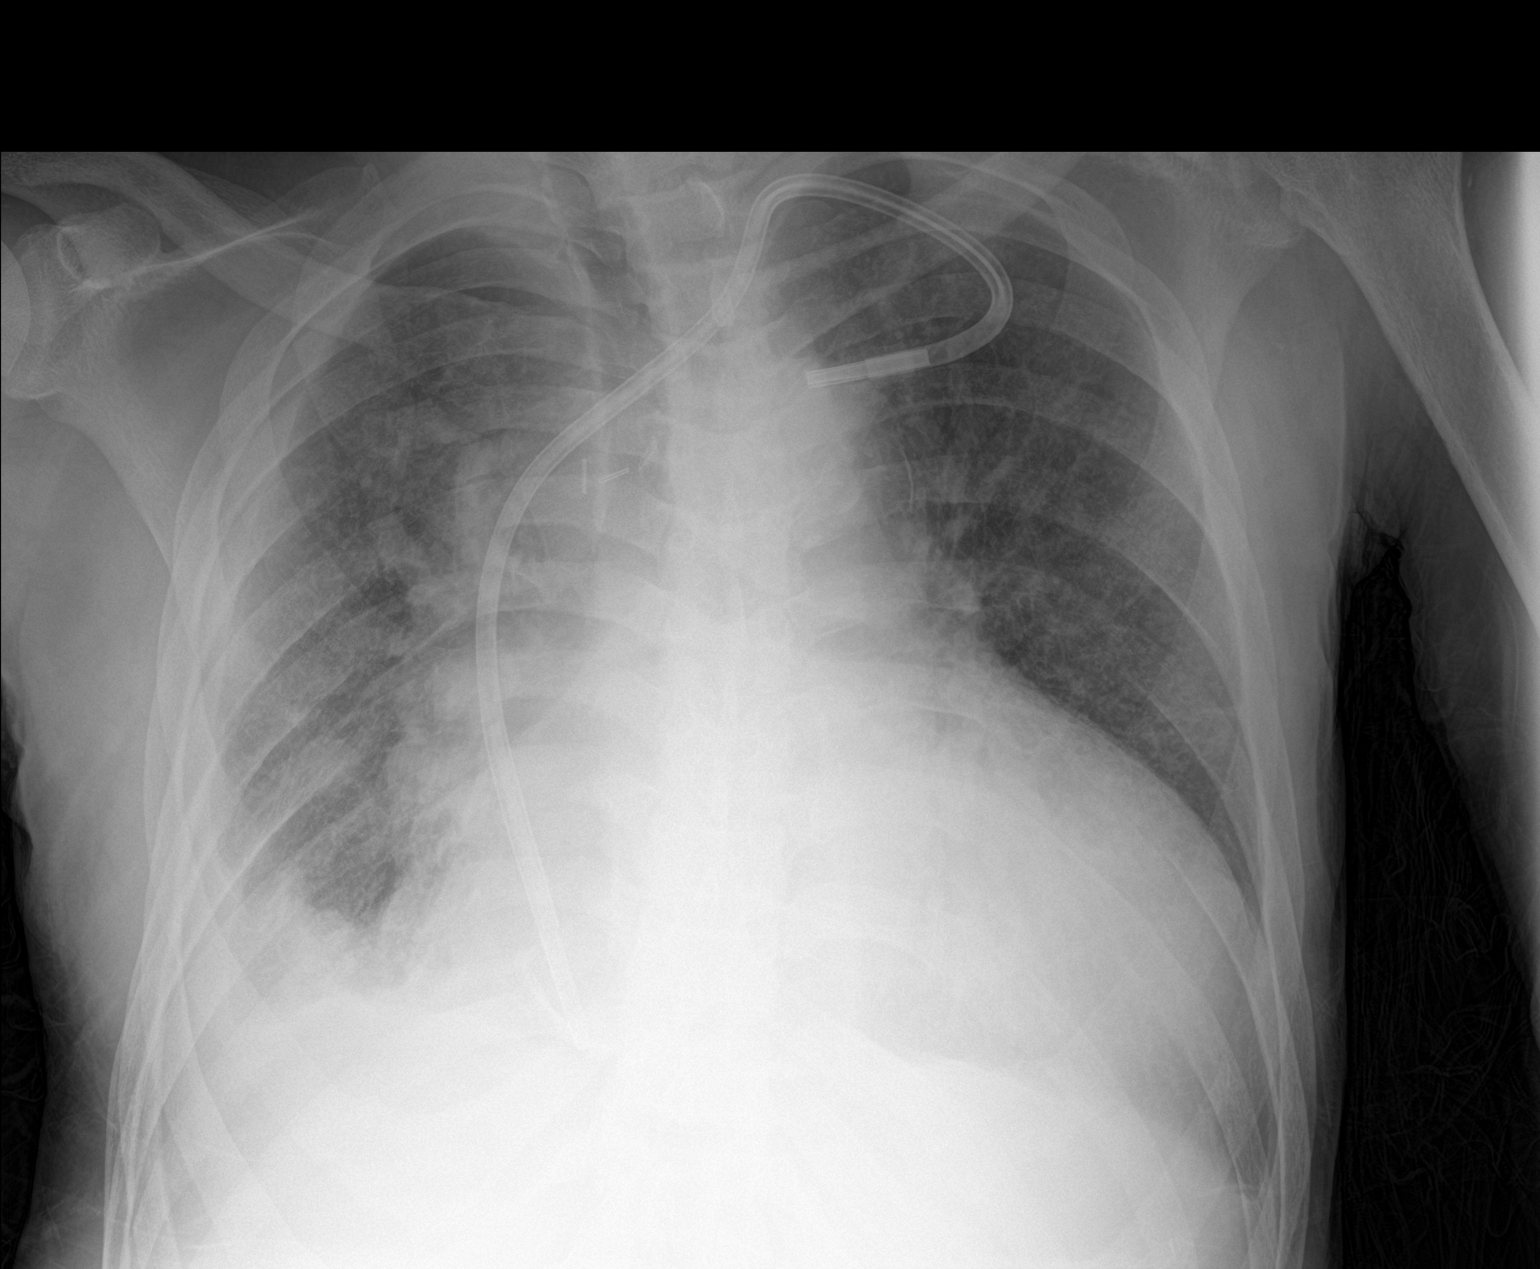

[1 of 1 positions shown; findings below may reference images not displayed]

FINDINGS: Dual lumen dialysis catheter tip terminates of the right atrium.
Marked enlargement of the cardiac silhouette is similar to
comparison studies and may reflect a combination of cardiomegaly and
pericardial effusions seen on comparison CTs. There is diffuse hazy
interstitial opacity throughout the lungs with pulmonary vascular
congestion, fissural and septal thickening, and bilateral pleural
effusions, moderate on the right and trace on the left. More patchy
airspace opacities are present in the right mid to lower lung as
well which could reflect developing alveolar edema though infection
is not excluded. No acute osseous or soft tissue abnormality.
IMPRESSION: 1. Findings most consistent with volume overload with pulmonary
edema and bilateral pleural effusions, moderate on the right and
trace on the left. More patchy airspace opacities in the right mid
to lower lung could reflect developing alveolar edema though
infection is not excluded.
2. Marked enlargement of the cardiac silhouette possibly reflecting
a combination of true cardiomegaly and pericardial effusion seen on
comparison.

## 2022-11-08 IMAGING — CT CT ABD-PELV W/O CM
2 of 4 series · 15 of 46 positions shown, 17 images · non-contrast
Comparison: 05/14/2020

CLINICAL DATA: Right lower quadrant abdominal pain

EXAM:
CT ABDOMEN AND PELVIS WITHOUT CONTRAST
TECHNIQUE: Multidetector CT imaging of the abdomen and pelvis was performed
following the standard protocol without IV contrast.

[Series 2: axial st · axial · 0.80mm/px · z∈[+1034,+1419]mm · 12 of 85 slices shown, 14 images]
[im 4/85  soft-tissue]
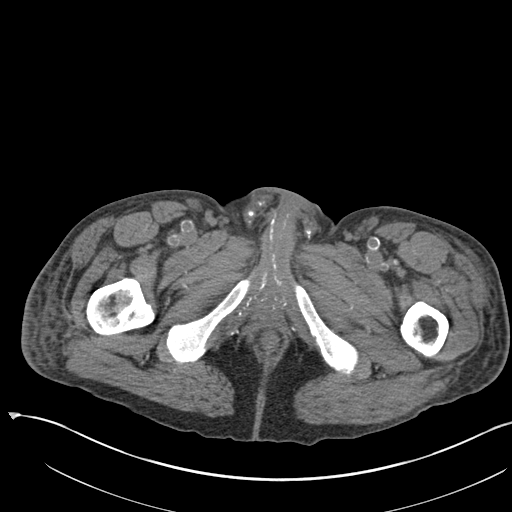
[im 4/85  bone]
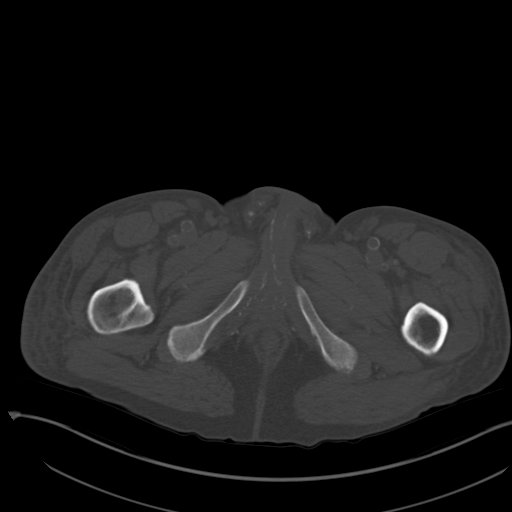
[im 11/85  soft-tissue]
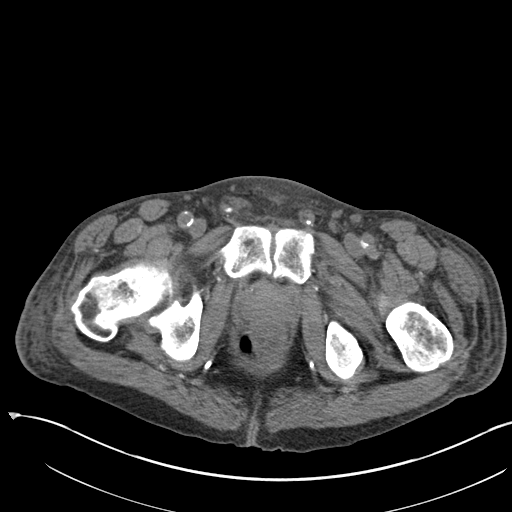
[im 19/85  soft-tissue]
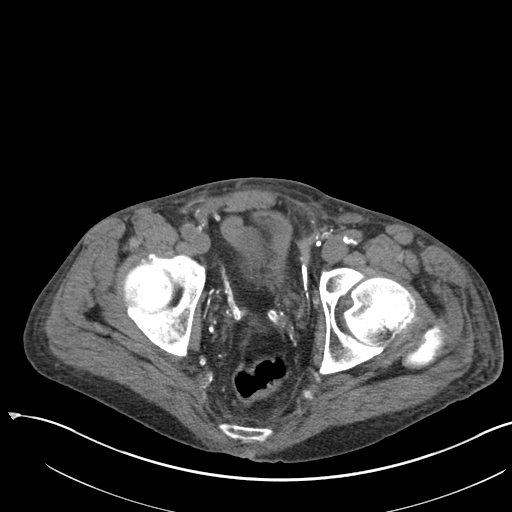
[im 26/85  soft-tissue]
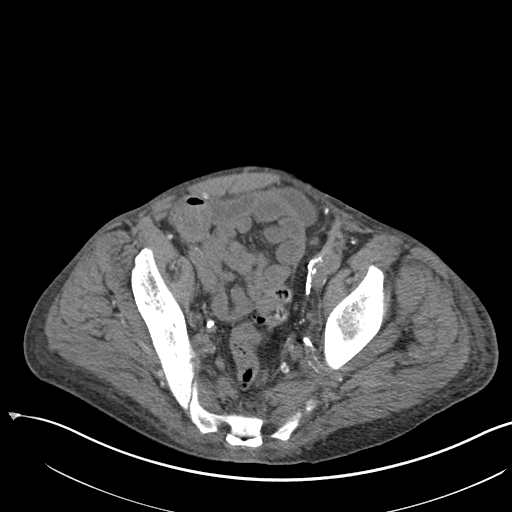
[im 33/85  soft-tissue]
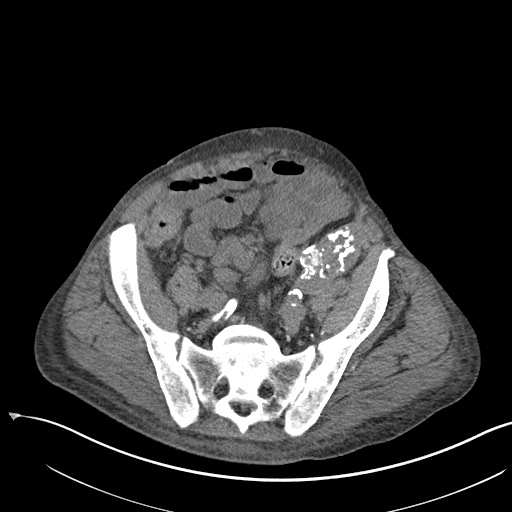
[im 41/85  soft-tissue]
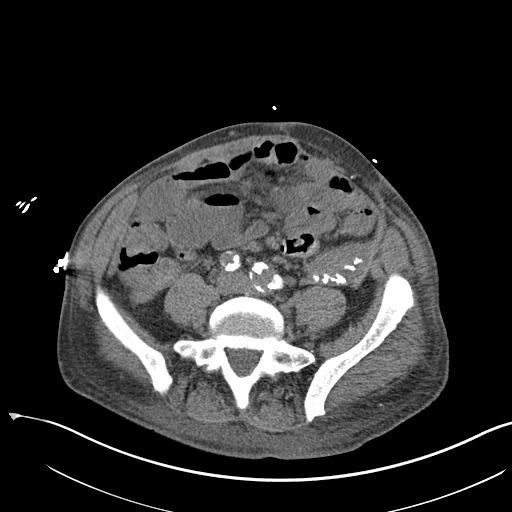
[im 44/85  soft-tissue]
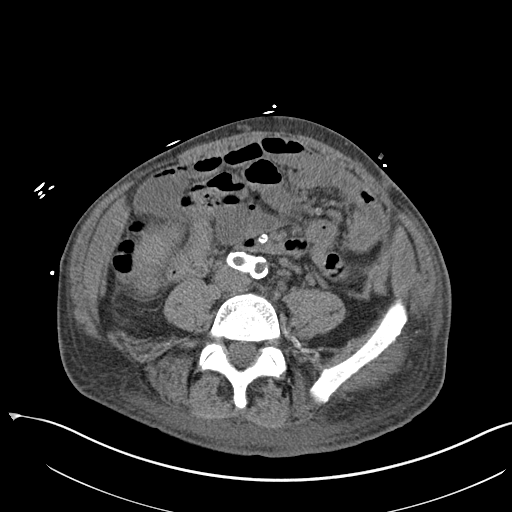
[im 52/85  soft-tissue]
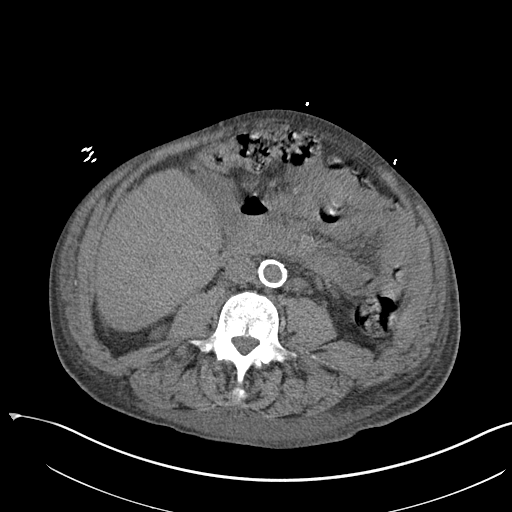
[im 59/85  soft-tissue]
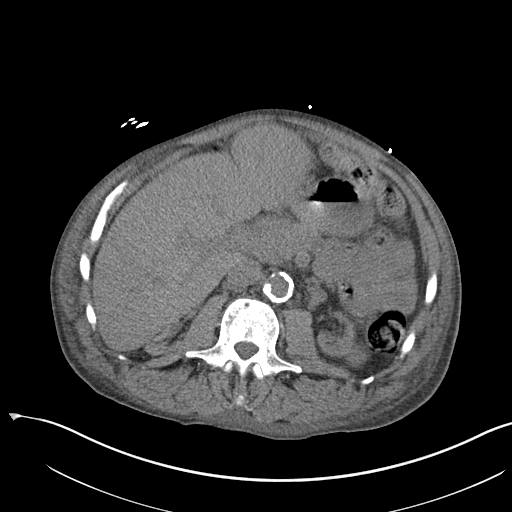
[im 59/85  bone]
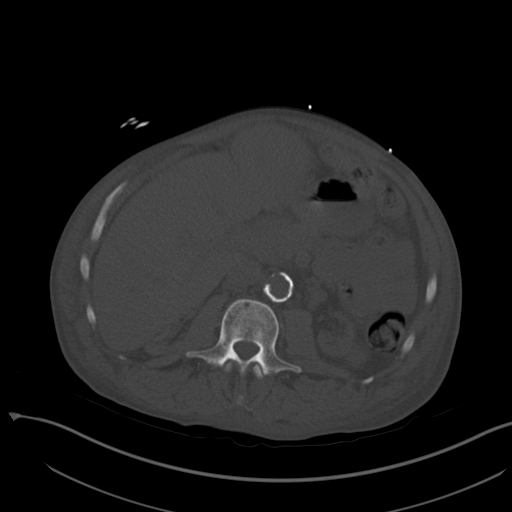
[im 66/85  soft-tissue]
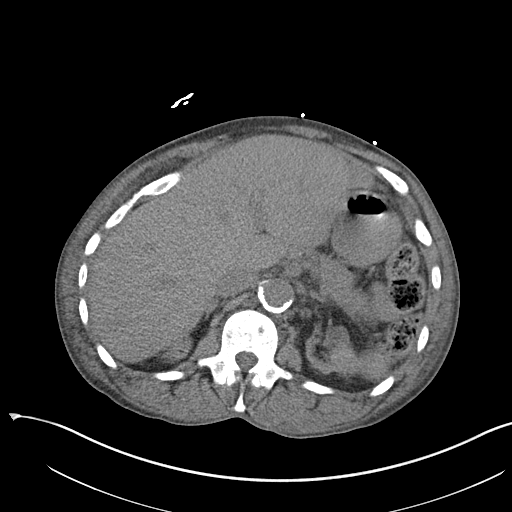
[im 74/85  soft-tissue]
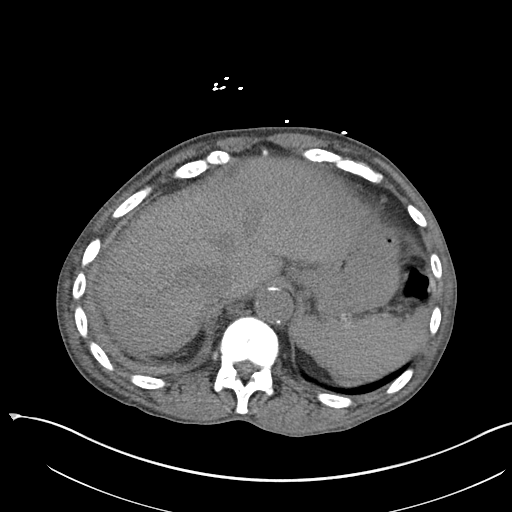
[im 81/85  soft-tissue]
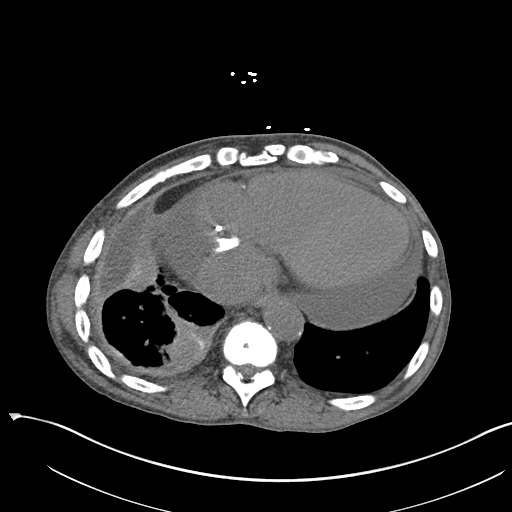

[Series 4: coronal st · coronal · 0.63mm/px · 3 of 142 slices shown]
[im 48/142  soft-tissue]
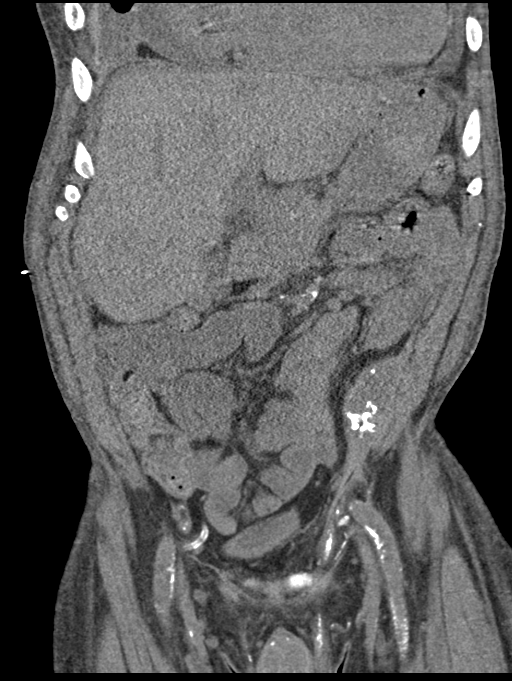
[im 63/142  soft-tissue]
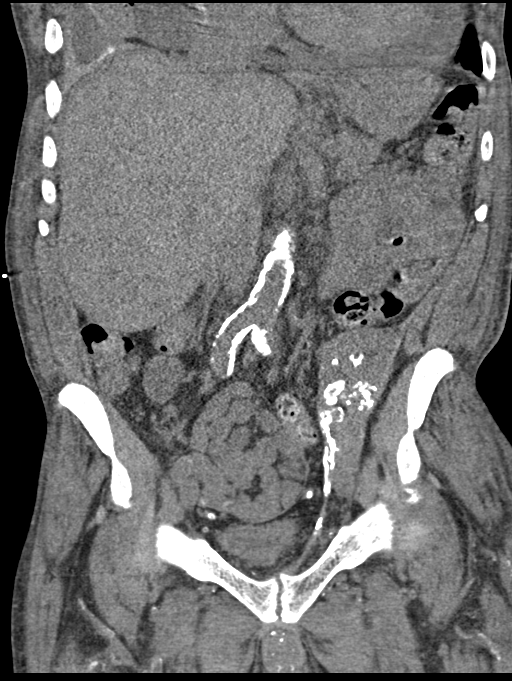
[im 79/142  soft-tissue]
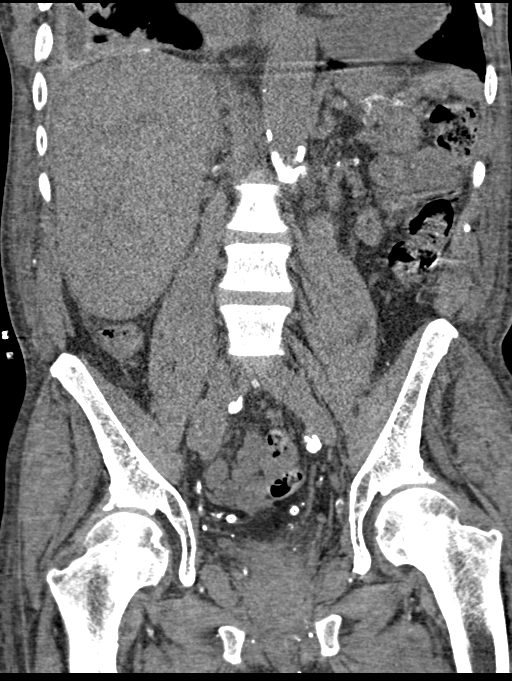

[15 of 46 positions shown; findings below may reference images not displayed]

FINDINGS: Lower chest: Incompletely evaluated focal areas of consolidation
within the right lung base anteriorly and posteriorly likely reflect
areas of rounded atelectasis in appear stable since CT arteriogram
of 03/09/2020. Loculated right pleural effusion and associated
pleural calcification, together possibly reflecting the sequela of
remote trauma, inflammation, or pleurodesis, appears unchanged.
Visualized left lung bases clear. Moderate pericardial effusion is
unchanged without CT evidence of cardiac tamponade. Mild to moderate
cardiomegaly is unchanged. Central venous catheter tip noted within
the right atrium.

Hepatobiliary: No focal liver abnormality is seen. No gallstones,
gallbladder wall thickening, or biliary dilatation.

Pancreas: Unremarkable

Spleen: Unremarkable

Adrenals/Urinary Tract: Adrenal glands are unremarkable. The kidneys
are markedly atrophic in keeping with changes of chronic renal
failure. There is a 2.9 cm soft tissue density exophytic lesion
arising from the interpolar region of the native left kidney,
indeterminate on this examination. A solid, enhancing renal mass was
noted on prior CT arteriogram of 03/11/2020 and, this most likely
represents a primary renal cell carcinoma. The bladder is
unremarkable. A densely calcified mass is seen within the left iliac
fossa most in keeping with failed renal transplant.

Stomach/Bowel: Stomach is within normal limits. Appendix appears
normal. No evidence of bowel wall thickening, distention, or
inflammatory changes. No free intraperitoneal gas or fluid.

Vascular/Lymphatic: There is extensive aortoiliac atherosclerotic
calcification. No aortic aneurysm. Extensive calcification of the
internal iliac vasculature. No pathologic lymphadenopathy within the
abdomen and pelvis.

Reproductive: Prostate is unremarkable.

Other: Rectum unremarkable.

Musculoskeletal: Osseous structures are diffusely sclerotic in
keeping with changes of renal osteodystrophy. There is subperiosteal
resorption involving the left sacroiliac joint and, to a lesser
extent the right sacroiliac joint, a finding that can be seen in the
setting of hyperparathyroidism or infectious or inflammatory
sacroiliitis.
IMPRESSION: Normal appendix. No radiographic explanation for the patient's
reported symptoms.

End-stage renal disease and failed transplant kidney within the left
iliac fossa. Superimposed 2.9 cm mass within the left kidney, better
characterized on prior CT arteriogram most in keeping with a primary
renal cell carcinoma. Urologic consultation is advised.

Peripheral vascular disease with extensive aortoiliac
atherosclerotic calcification.

Subperiosteal resorption involving the sacroiliac joints
bilaterally. Differential considerations as listed above. Given the
constellation of additional findings, hyperparathyroidism should be
considered primarily.

Aortic Atherosclerosis ([id number].I).

## 2022-12-31 IMAGING — DX DG CHEST 1V PORT
2 series · 2 of 2 positions shown · non-contrast
Comparison: June 10, 2020

CLINICAL DATA: Shortness of breath

EXAM:
PORTABLE CHEST 1 VIEW

[chest ap (1 of 2)]
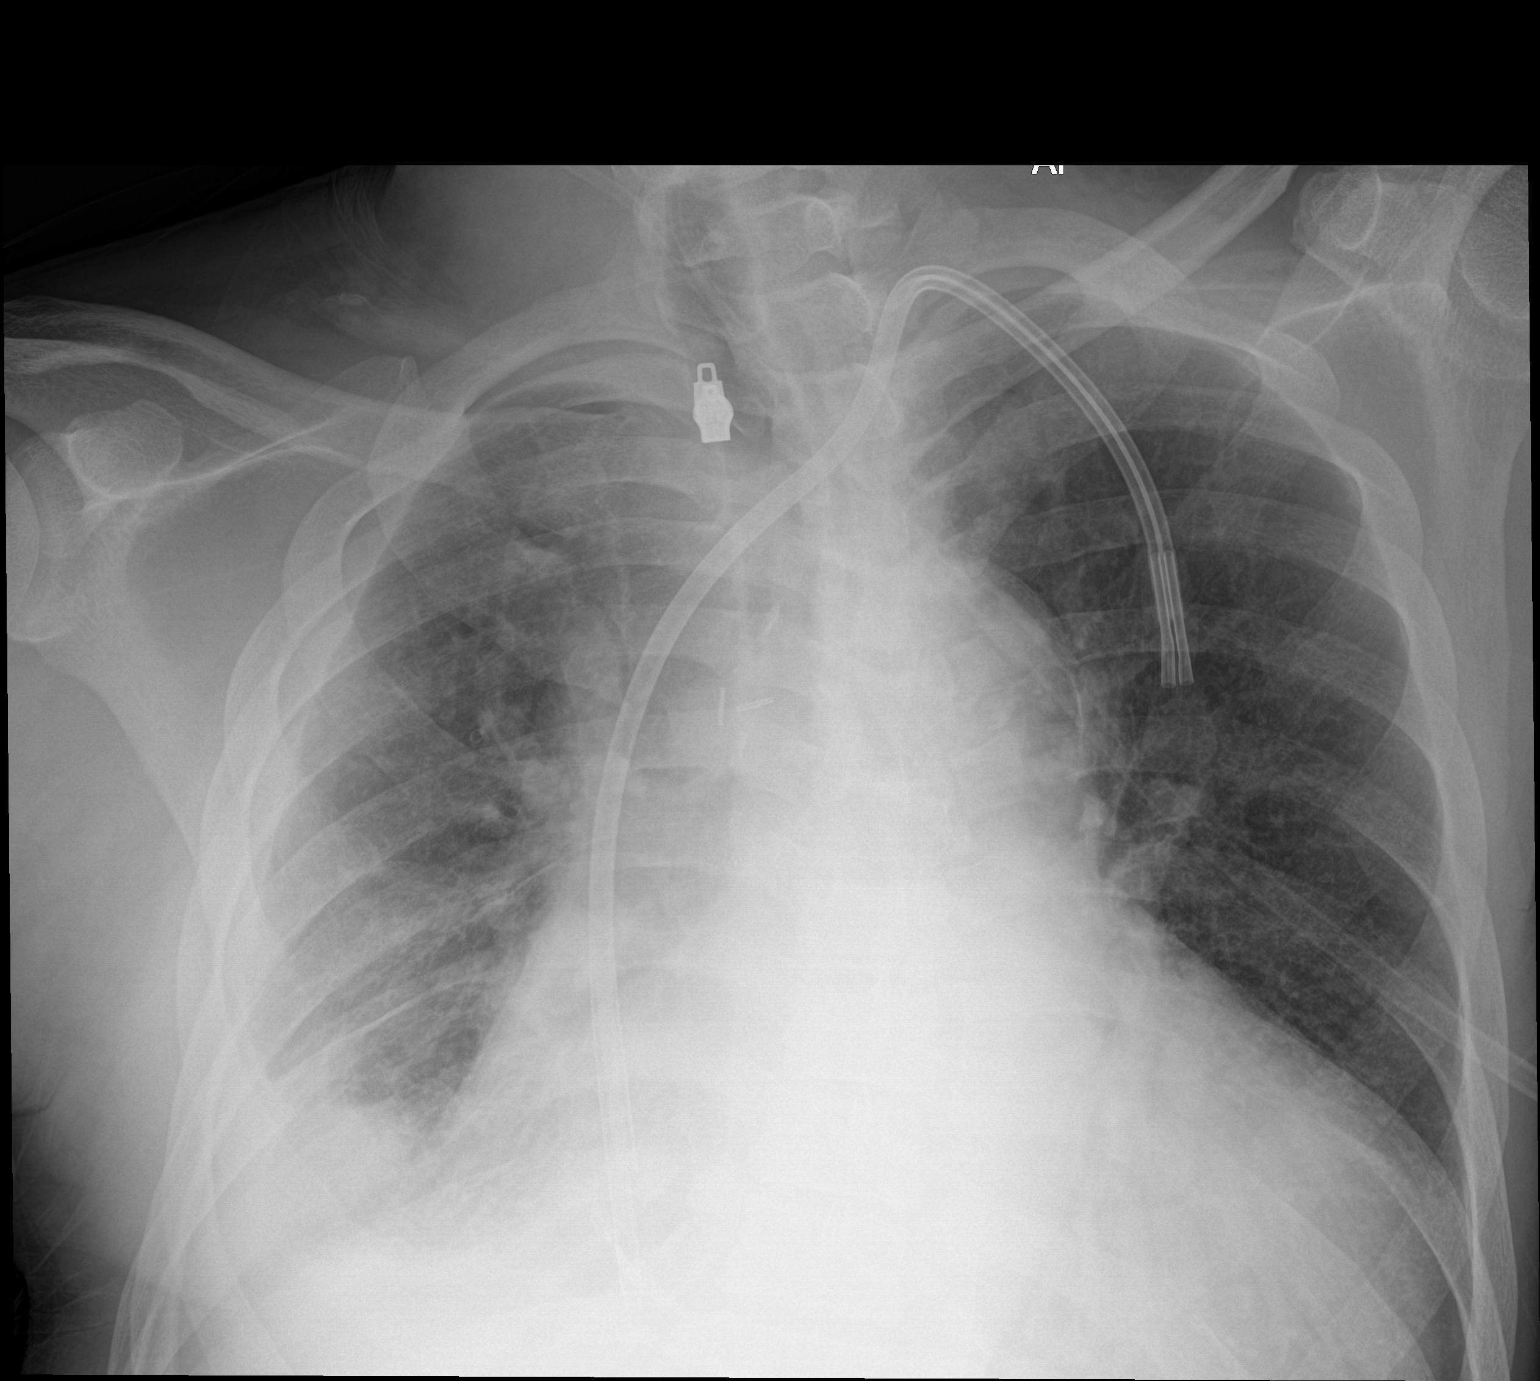

[chest ap (2 of 2)]
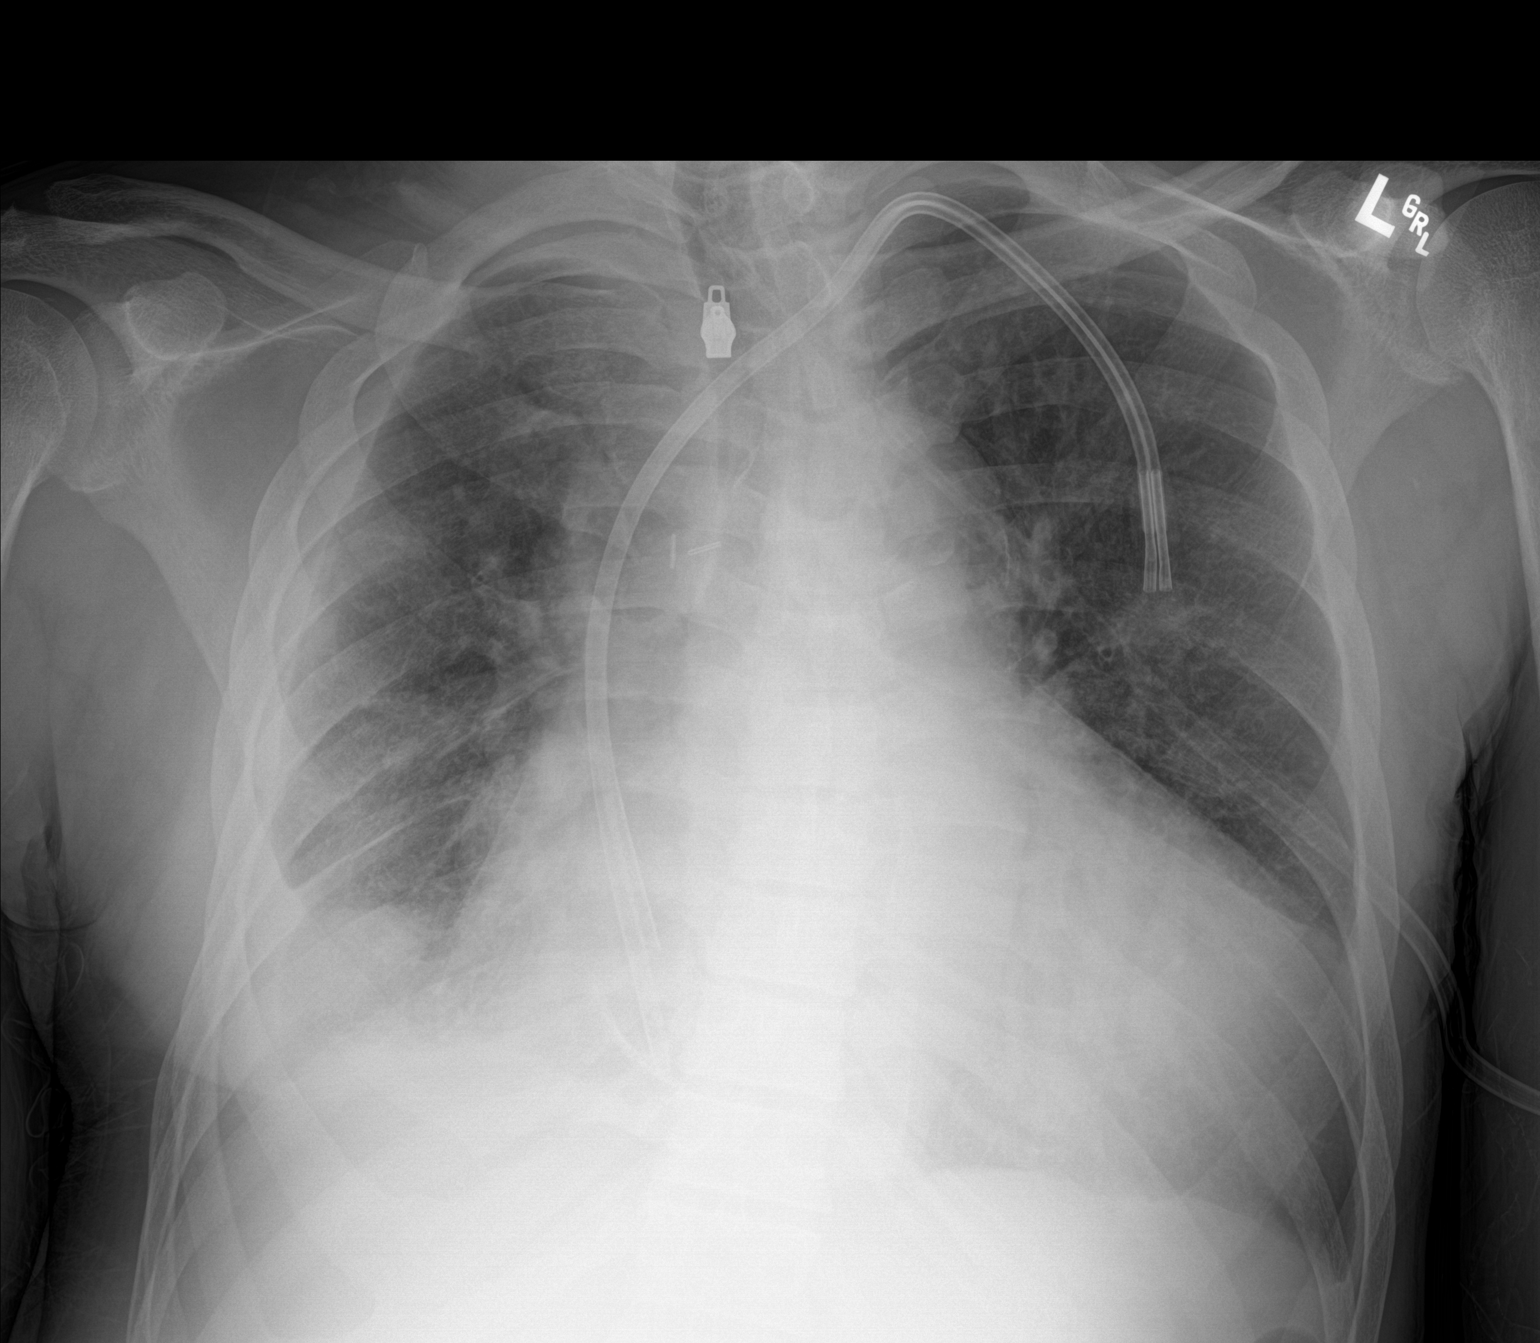

[2 of 2 positions shown; findings below may reference images not displayed]

FINDINGS: There is a well-positioned tunneled dialysis catheter. The heart
size is significantly enlarged. There is a growing right-sided
pleural effusion. There is vascular congestion with interstitial
edema. There is a small left-sided pleural effusion. There is an
opacity at the right lung base favored to represent an area of
atelectasis. Aortic calcifications are noted.
IMPRESSION: 1. Cardiomegaly with vascular congestion and mild interstitial
edema.
2. Growing bilateral pleural effusions, right greater than left.
3. Persistent opacities at the right lung base favored to represent
atelectasis.

## 2023-01-07 IMAGING — DX DG CHEST 1V PORT
1 series · 1 of 1 positions shown · non-contrast
Comparison: Portable chest 08/11/2020 and earlier.

CLINICAL DATA: 54-year-old male who awoke with mid chest pain,
shortness of breath.

EXAM:
PORTABLE CHEST 1 VIEW

[chest ap]
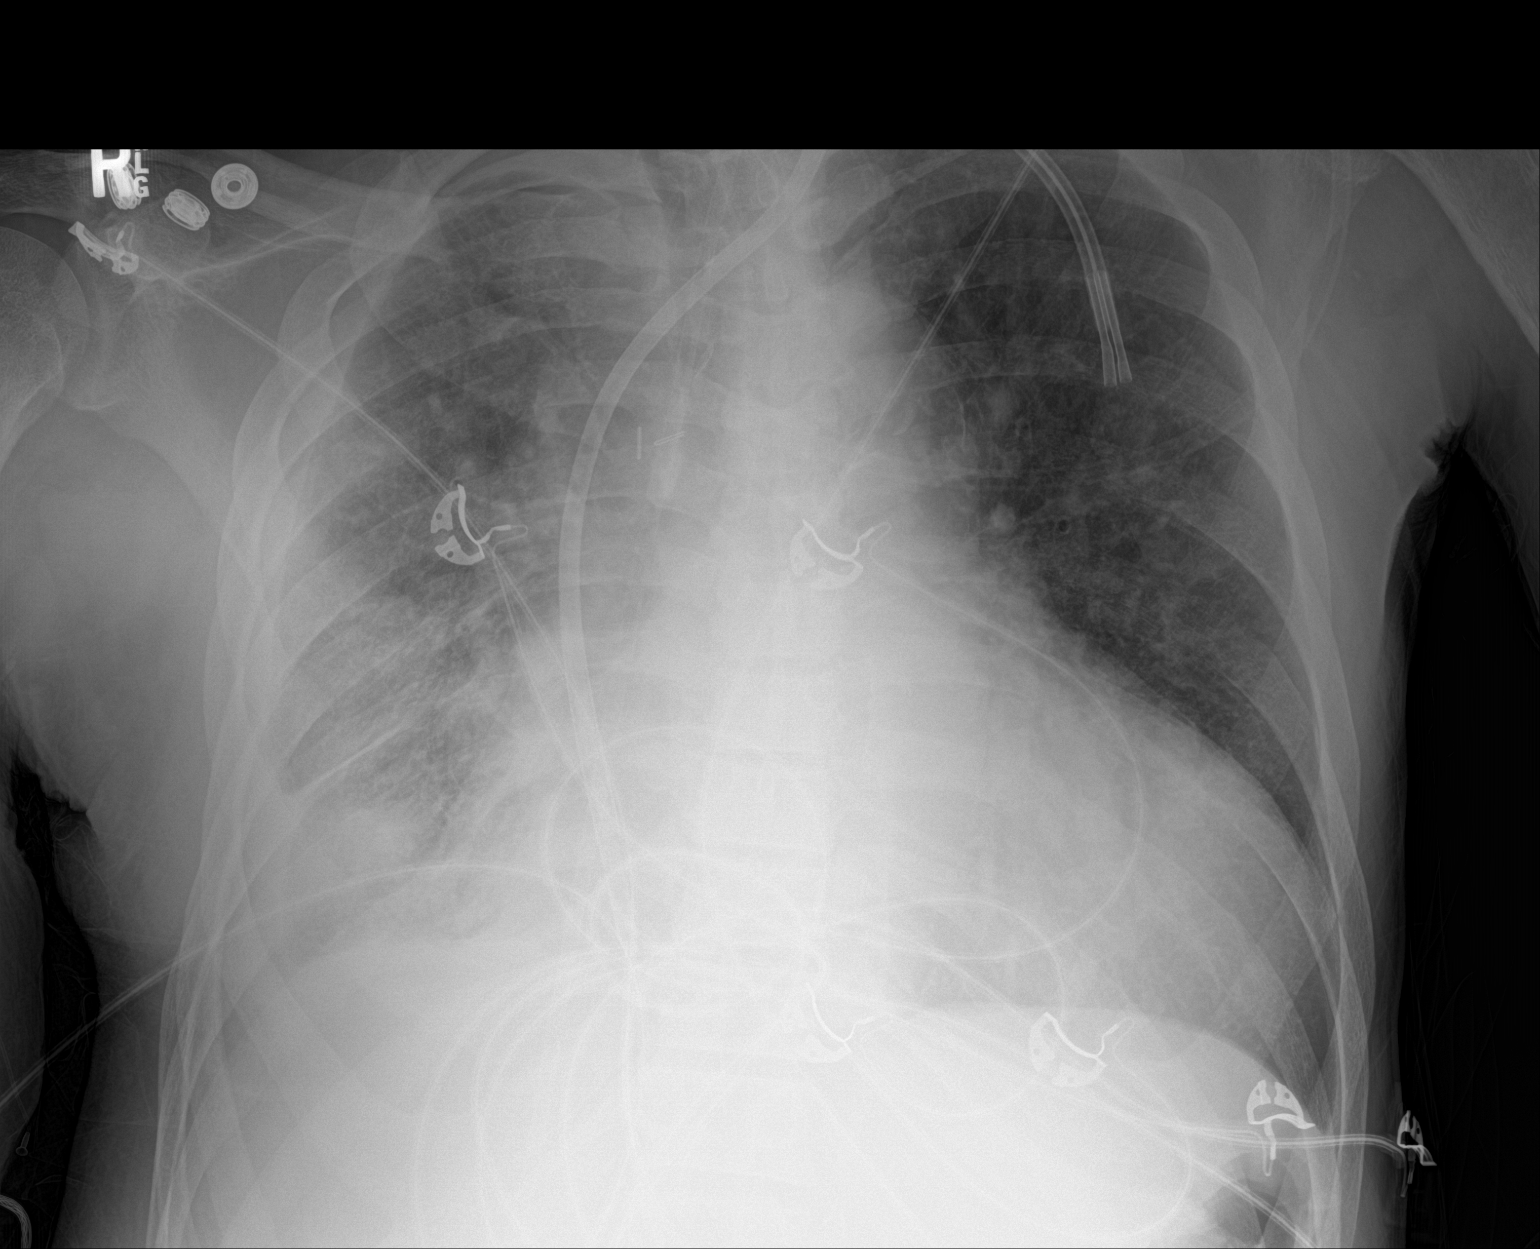

[1 of 1 positions shown; findings below may reference images not displayed]

FINDINGS: Portable AP semi upright view at [id number] hours. Stable cardiomegaly and
mediastinal contours. Stable left chest dual lumen dialysis type
catheter. Stable lung volumes, mild diffuse increased interstitial
opacity and patchy chronic appearing right lung opacity favored due
to fibrothorax on the basis of chest CTA 03/09/2020. No pneumothorax
or new pulmonary opacity. Stable visualized osseous structures.
Paucity of bowel gas in the upper abdomen.
IMPRESSION: 1. Chronic cardiomegaly and right lung fibrothorax.
2. Pulmonary vascular congestion, mild or developing interstitial
edema is difficult to exclude.
